# Patient Record
Sex: Male | Born: 1945
Health system: Southern US, Community
[De-identification: ages and names within clinical notes are randomized; demographics above are authoritative.]

## PROBLEM LIST (undated history)

## (undated) DIAGNOSIS — G8929 Other chronic pain: Secondary | ICD-10-CM

## (undated) DIAGNOSIS — M479 Spondylosis, unspecified: Secondary | ICD-10-CM

## (undated) DIAGNOSIS — N471 Phimosis: Secondary | ICD-10-CM

## (undated) DIAGNOSIS — I2510 Atherosclerotic heart disease of native coronary artery without angina pectoris: Secondary | ICD-10-CM

## (undated) DIAGNOSIS — G4733 Obstructive sleep apnea (adult) (pediatric): Secondary | ICD-10-CM

## (undated) DIAGNOSIS — K7460 Unspecified cirrhosis of liver: Secondary | ICD-10-CM

## (undated) DIAGNOSIS — M545 Low back pain, unspecified: Secondary | ICD-10-CM

## (undated) DIAGNOSIS — K219 Gastro-esophageal reflux disease without esophagitis: Secondary | ICD-10-CM

## (undated) DIAGNOSIS — D179 Benign lipomatous neoplasm, unspecified: Secondary | ICD-10-CM

## (undated) DIAGNOSIS — E119 Type 2 diabetes mellitus without complications: Secondary | ICD-10-CM

## (undated) DIAGNOSIS — Z87820 Personal history of traumatic brain injury: Secondary | ICD-10-CM

## (undated) DIAGNOSIS — Z8489 Family history of other specified conditions: Secondary | ICD-10-CM

## (undated) DIAGNOSIS — J45909 Unspecified asthma, uncomplicated: Secondary | ICD-10-CM

## (undated) DIAGNOSIS — E785 Hyperlipidemia, unspecified: Secondary | ICD-10-CM

## (undated) DIAGNOSIS — D509 Iron deficiency anemia, unspecified: Secondary | ICD-10-CM

## (undated) DIAGNOSIS — M1990 Unspecified osteoarthritis, unspecified site: Secondary | ICD-10-CM

## (undated) DIAGNOSIS — I10 Essential (primary) hypertension: Secondary | ICD-10-CM

## (undated) DIAGNOSIS — Z8719 Personal history of other diseases of the digestive system: Secondary | ICD-10-CM

## (undated) DIAGNOSIS — N200 Calculus of kidney: Secondary | ICD-10-CM

## (undated) DIAGNOSIS — I5030 Unspecified diastolic (congestive) heart failure: Secondary | ICD-10-CM

## (undated) DIAGNOSIS — Z9289 Personal history of other medical treatment: Secondary | ICD-10-CM

## (undated) DIAGNOSIS — I509 Heart failure, unspecified: Secondary | ICD-10-CM

## (undated) DIAGNOSIS — Z8739 Personal history of other diseases of the musculoskeletal system and connective tissue: Secondary | ICD-10-CM

## (undated) HISTORY — DX: Obstructive sleep apnea (adult) (pediatric): G47.33

## (undated) HISTORY — PX: NEPHROLITHOTOMY: SUR881

## (undated) HISTORY — PX: HERNIA REPAIR: SHX51

## (undated) HISTORY — DX: Unspecified osteoarthritis, unspecified site: M19.90

## (undated) HISTORY — PX: CORONARY ANGIOPLASTY WITH STENT PLACEMENT: SHX49

## (undated) HISTORY — DX: Essential (primary) hypertension: I10

## (undated) SURGERY — EGD (ESOPHAGOGASTRODUODENOSCOPY)
Anesthesia: Moderate Sedation

---

## 1987-08-30 HISTORY — PX: ANKLE FRACTURE SURGERY: SHX122

## 1989-04-29 HISTORY — PX: NEPHROLITHOTOMY: SUR881

## 1998-02-19 ENCOUNTER — Emergency Department (HOSPITAL_COMMUNITY): Admission: EM | Admit: 1998-02-19 | Discharge: 1998-02-19 | Payer: Self-pay | Admitting: Emergency Medicine

## 1999-10-11 ENCOUNTER — Emergency Department (HOSPITAL_COMMUNITY): Admission: EM | Admit: 1999-10-11 | Discharge: 1999-10-11 | Payer: Self-pay | Admitting: Emergency Medicine

## 2000-09-04 ENCOUNTER — Emergency Department (HOSPITAL_COMMUNITY): Admission: EM | Admit: 2000-09-04 | Discharge: 2000-09-04 | Payer: Self-pay | Admitting: Emergency Medicine

## 2000-09-05 ENCOUNTER — Emergency Department (HOSPITAL_COMMUNITY): Admission: EM | Admit: 2000-09-05 | Discharge: 2000-09-05 | Payer: Self-pay | Admitting: Emergency Medicine

## 2000-09-25 ENCOUNTER — Encounter: Payer: Self-pay | Admitting: Pulmonary Disease

## 2000-09-25 ENCOUNTER — Ambulatory Visit (HOSPITAL_COMMUNITY): Admission: RE | Admit: 2000-09-25 | Discharge: 2000-09-25 | Payer: Self-pay | Admitting: Pulmonary Disease

## 2000-12-29 ENCOUNTER — Encounter (INDEPENDENT_AMBULATORY_CARE_PROVIDER_SITE_OTHER): Payer: Self-pay | Admitting: Specialist

## 2000-12-29 ENCOUNTER — Ambulatory Visit: Admission: RE | Admit: 2000-12-29 | Discharge: 2000-12-29 | Payer: Self-pay | Admitting: Pulmonary Disease

## 2001-04-18 ENCOUNTER — Emergency Department (HOSPITAL_COMMUNITY): Admission: EM | Admit: 2001-04-18 | Discharge: 2001-04-18 | Payer: Self-pay | Admitting: Internal Medicine

## 2001-05-23 ENCOUNTER — Ambulatory Visit (HOSPITAL_COMMUNITY): Admission: RE | Admit: 2001-05-23 | Discharge: 2001-05-23 | Payer: Self-pay | Admitting: Pulmonary Disease

## 2001-05-23 ENCOUNTER — Encounter: Payer: Self-pay | Admitting: Pulmonary Disease

## 2001-06-07 ENCOUNTER — Encounter: Payer: Self-pay | Admitting: Emergency Medicine

## 2001-06-07 ENCOUNTER — Emergency Department (HOSPITAL_COMMUNITY): Admission: EM | Admit: 2001-06-07 | Discharge: 2001-06-07 | Payer: Self-pay | Admitting: Emergency Medicine

## 2003-05-05 ENCOUNTER — Emergency Department (HOSPITAL_COMMUNITY): Admission: EM | Admit: 2003-05-05 | Discharge: 2003-05-05 | Payer: Self-pay | Admitting: Emergency Medicine

## 2003-05-25 ENCOUNTER — Encounter: Payer: Self-pay | Admitting: Emergency Medicine

## 2003-05-25 ENCOUNTER — Emergency Department (HOSPITAL_COMMUNITY): Admission: EM | Admit: 2003-05-25 | Discharge: 2003-05-25 | Payer: Self-pay | Admitting: *Deleted

## 2003-05-26 ENCOUNTER — Emergency Department (HOSPITAL_COMMUNITY): Admission: EM | Admit: 2003-05-26 | Discharge: 2003-05-26 | Payer: Self-pay | Admitting: Emergency Medicine

## 2003-10-08 ENCOUNTER — Emergency Department (HOSPITAL_COMMUNITY): Admission: EM | Admit: 2003-10-08 | Discharge: 2003-10-08 | Payer: Self-pay

## 2004-05-16 ENCOUNTER — Encounter (INDEPENDENT_AMBULATORY_CARE_PROVIDER_SITE_OTHER): Payer: Self-pay | Admitting: *Deleted

## 2004-05-16 ENCOUNTER — Inpatient Hospital Stay (HOSPITAL_COMMUNITY): Admission: EM | Admit: 2004-05-16 | Discharge: 2004-05-22 | Payer: Self-pay | Admitting: Emergency Medicine

## 2004-05-16 HISTORY — PX: APPENDECTOMY: SHX54

## 2004-05-16 HISTORY — PX: COLECTOMY: SHX59

## 2004-05-16 HISTORY — PX: OTHER SURGICAL HISTORY: SHX169

## 2005-08-09 ENCOUNTER — Emergency Department (HOSPITAL_COMMUNITY): Admission: EM | Admit: 2005-08-09 | Discharge: 2005-08-09 | Payer: Self-pay | Admitting: Emergency Medicine

## 2006-04-06 ENCOUNTER — Emergency Department (HOSPITAL_COMMUNITY): Admission: EM | Admit: 2006-04-06 | Discharge: 2006-04-06 | Payer: Self-pay | Admitting: Family Medicine

## 2008-01-12 ENCOUNTER — Emergency Department (HOSPITAL_COMMUNITY): Admission: EM | Admit: 2008-01-12 | Discharge: 2008-01-12 | Payer: Self-pay | Admitting: Emergency Medicine

## 2009-07-04 ENCOUNTER — Emergency Department (HOSPITAL_COMMUNITY): Admission: EM | Admit: 2009-07-04 | Discharge: 2009-07-04 | Payer: Self-pay | Admitting: Emergency Medicine

## 2009-07-04 LAB — CONVERTED CEMR LAB
HCT: 41 %
Hemoglobin: 14.2 g/dL

## 2009-07-06 ENCOUNTER — Emergency Department (HOSPITAL_COMMUNITY): Admission: EM | Admit: 2009-07-06 | Discharge: 2009-07-06 | Payer: Self-pay | Admitting: Emergency Medicine

## 2010-04-02 ENCOUNTER — Emergency Department (HOSPITAL_COMMUNITY): Admission: EM | Admit: 2010-04-02 | Discharge: 2010-04-02 | Payer: Self-pay | Admitting: Emergency Medicine

## 2010-07-24 ENCOUNTER — Emergency Department (HOSPITAL_COMMUNITY): Admission: EM | Admit: 2010-07-24 | Discharge: 2010-07-24 | Payer: Self-pay | Admitting: Emergency Medicine

## 2010-07-24 LAB — CONVERTED CEMR LAB
BUN: 9 mg/dL
Calcium: 8.7 mg/dL
Creatinine, Ser: 0.73 mg/dL
HCT: 41.5 %
Hemoglobin: 14.1 g/dL
MCV: 88.5 fL
Monocytes Relative: 7 %
Potassium: 4 meq/L
RBC: 4.69 M/uL
Sodium: 137 meq/L
WBC: 6.1 10*3/uL

## 2010-08-04 ENCOUNTER — Ambulatory Visit: Payer: Self-pay | Admitting: Internal Medicine

## 2010-08-04 DIAGNOSIS — E118 Type 2 diabetes mellitus with unspecified complications: Secondary | ICD-10-CM | POA: Insufficient documentation

## 2010-08-04 DIAGNOSIS — M109 Gout, unspecified: Secondary | ICD-10-CM | POA: Insufficient documentation

## 2010-08-04 DIAGNOSIS — I119 Hypertensive heart disease without heart failure: Secondary | ICD-10-CM | POA: Insufficient documentation

## 2010-08-04 DIAGNOSIS — Z87442 Personal history of urinary calculi: Secondary | ICD-10-CM | POA: Insufficient documentation

## 2010-08-04 DIAGNOSIS — K429 Umbilical hernia without obstruction or gangrene: Secondary | ICD-10-CM | POA: Insufficient documentation

## 2010-08-04 DIAGNOSIS — Z87891 Personal history of nicotine dependence: Secondary | ICD-10-CM | POA: Insufficient documentation

## 2010-08-04 DIAGNOSIS — K219 Gastro-esophageal reflux disease without esophagitis: Secondary | ICD-10-CM | POA: Insufficient documentation

## 2010-08-04 HISTORY — DX: Gastro-esophageal reflux disease without esophagitis: K21.9

## 2010-08-05 LAB — CONVERTED CEMR LAB
Hgb A1c MFr Bld: 6.8 % — ABNORMAL HIGH (ref 4.6–6.5)
LDL Cholesterol: 124 mg/dL — ABNORMAL HIGH (ref 0–99)
VLDL: 27.4 mg/dL (ref 0.0–40.0)

## 2010-08-12 ENCOUNTER — Encounter: Payer: Self-pay | Admitting: Internal Medicine

## 2010-09-15 ENCOUNTER — Ambulatory Visit
Admission: RE | Admit: 2010-09-15 | Discharge: 2010-09-15 | Payer: Self-pay | Source: Home / Self Care | Attending: Internal Medicine | Admitting: Internal Medicine

## 2010-09-15 DIAGNOSIS — G4730 Sleep apnea, unspecified: Secondary | ICD-10-CM | POA: Insufficient documentation

## 2010-09-19 ENCOUNTER — Encounter: Payer: Self-pay | Admitting: Cardiology

## 2010-09-27 ENCOUNTER — Encounter: Payer: Self-pay | Admitting: Internal Medicine

## 2010-09-30 NOTE — Assessment & Plan Note (Signed)
Summary: 6 WK FU   STC   Vital Signs:  Patient profile:   65 year old male Height:      68 inches (172.72 cm) Weight:      264.8 pounds (120.36 kg) O2 Sat:      95 % on Room air Temp:     98.9 degrees F (37.17 degrees C) oral Pulse rate:   78 / minute BP sitting:   130 / 72  (left arm) Cuff size:   large  Vitals Entered By: Orlan Leavens RMA (September 15, 2010 2:18 PM)  O2 Flow:  Room air CC: 6 WEEK FOLLOW-UP Is Patient Diabetic? Yes Did you bring your meter with you today? No Pain Assessment Patient in pain? no        Primary Care Provider:  Newt Lukes MD  CC:  6 WEEK FOLLOW-UP.  History of Present Illness: here for f/u  1) DM2 - dx 2005; lost to f/u with pcp since that time due to loss of job and insurance coverage until 07/2010 - started metformin 07/2010 - reports compliance and deneis adv se/complications- does not check cbg regularly at home but runs 120-200 when he does check - c/o numbness both feet and occ blurring vision -   2) HTN - started arb 07/2010 for same - reports compliance with ongoing medical treatment and no changes in medication dose or frequency.denies adverse side effects related to current therapy. denies CP or HA but chronic edema BLE -  3) EtOH abuse - daily beer - cutting back on use in past few months - now 1-2 beer/day - denies WD symptoms or prior DTs - no known liver complications or hx legal/employer issues with alcohol  4) gout - recent flare in left ankle - no swelling or recent trauma/injury - +hx prioor falres - improved with self-tx cherry juice  4) GERD - no meds for same regularly - episode indiugestion 3-4x/week - relived with tums as needed  - no abd pain or change in stool, no melena or brbpr hx  Preventive Screening-Counseling & Management  Alcohol-Tobacco     Alcohol drinks/day: 1     Alcohol type: beer     >5/day in last 3 mos: yes     Alcohol Counseling: to STOP drinking     Smoking Status: quit     Year Quit:  1997     Tobacco Counseling: not to resume use of tobacco products  Caffeine-Diet-Exercise     Does Patient Exercise: yes     Exercise Counseling: to improve exercise regimen     Depression Counseling: not indicated; screening negative for depression  Clinical Review Panels:  Lipid Management   Cholesterol:  187 (08/04/2010)   LDL (bad choesterol):  124 (08/04/2010)   HDL (good cholesterol):  35.50 (08/04/2010)  Diabetes Management   HgBA1C:  6.8 (08/04/2010)   Creatinine:  0.73 (07/24/2010)   Last Dilated Eye Exam:  normal Recheck in 12 months (08/12/2010)  CBC   WBC:  6.1 (07/24/2010)   RBC:  4.69 (07/24/2010)   Hgb:  14.1 (07/24/2010)   Hct:  41.5 (07/24/2010)   Platelets:  131 (07/24/2010)   MCV  88.5 (07/24/2010)   RDW  13.8 (07/24/2010)   PMN:  66 (07/24/2010)   Monos:  7 (07/24/2010)   Eosinophils:  2 (07/24/2010)   Basophil:  0 (07/24/2010)  Complete Metabolic Panel   Glucose:  101 (07/24/2010)   Sodium:  137 (07/24/2010)   Potassium:  4.0 (07/24/2010)  Chloride:  103 (07/24/2010)   CO2:  27 (07/24/2010)   BUN:  9 (07/24/2010)   Creatinine:  0.73 (07/24/2010)   Albumin:  3.9 (07/24/2010)   Total Protein:  8.2 (07/24/2010)   Calcium:  8.7 (07/24/2010)   Total Bili:  0.7 (07/24/2010)   Alk Phos:  96 (07/24/2010)   SGPT (ALT):  19 (07/24/2010)   SGOT (AST):  30 (07/24/2010)   Current Medications (verified): 1)  Cozaar 50 Mg Tabs (Losartan Potassium) .Marland Kitchen.. 1 By Mouth Once Daily 2)  Metformin Hcl 500 Mg Xr24h-Tab (Metformin Hcl) .Marland Kitchen.. 1 By Mouth Once Daily 3)  Advil 200 Mg Tabs (Ibuprofen) .Marland Kitchen.. 1-2 By Mouth Every 6 Hours As Needed For Pain Symptoms  Allergies (verified): No Known Drug Allergies  Past History:  Past Medical History: Diabetes mellitus, type II Hypertension GERD  gout umbilical hernia OSA? - no sleep study ever done  MD roster: surg- [facility]  Review of Systems  The patient denies chest pain, syncope, and headaches.         wife  c/o pt "not breathing" when he sleeps, +snoring - told he had sleep apnea at [facility] years ago after surg but never saw sleep doc.  Physical Exam  General:  overweight-appearing.  alert, well-developed, well-nourished, and cooperative to examination.   wife at side Lungs:  normal respiratory effort, no intercostal retractions or use of accessory muscles; normal breath sounds bilaterally - no crackles and no wheezes.    Heart:  normal rate, regular rhythm, no murmur, and no rub. BLE with trace edema.  Abdomen:  5cm round umbilical hernia, not incarcerated - obese, soft, non-tender, normal bowel sounds, no distention; no appreciable hepatomegaly or splenomegaly.     Impression & Recommendations:  Problem # 1:  DIABETES MELLITUS, TYPE II (ICD-250.00)  His updated medication list for this problem includes:    Cozaar 50 Mg Tabs (Losartan potassium) .Marland Kitchen... 1 by mouth once daily    Metformin Hcl 500 Mg Xr24h-tab (Metformin hcl) .Marland Kitchen... 1 by mouth once daily  07/2010 started metformin + ARB cont same  Labs Reviewed: Creat: 0.73 (07/24/2010)     Last Eye Exam: normal Recheck in 12 months (08/12/2010) Reviewed HgBA1c results: 6.8 (08/04/2010)  Problem # 2:  HYPERTENSION (ICD-401.9)  His updated medication list for this problem includes:    Cozaar 50 Mg Tabs (Losartan potassium) .Marland Kitchen... 1 by mouth once daily  started arb 07/2010- improved cont same  BP today: 130/72 Prior BP: 144/72 (08/04/2010)  Labs Reviewed: K+: 4.0 (07/24/2010) Creat: : 0.73 (07/24/2010)   Chol: 187 (08/04/2010)   HDL: 35.50 (08/04/2010)   LDL: 124 (08/04/2010)   TG: 137.0 (08/04/2010)  Problem # 3:  SLEEP APNEA (ICD-780.57)  suspect sleep apnea based on hx and body habitus - ?told same post op [facility] ago at [facility] refer to sleep specialist now -- would be important to dx/tx prior to any elctive surg repair of hernia...  Orders: Sleep Disorder Referral (Sleep Disorder)  Problem # 4:  HERNIA, UMBILICAL (ICD-553.1)  to  folllowup with surg ([facility]) for elective repair once medically "tuned" - pt aware his surg told him to lose weight prior to surg repair  Complete Medication List: 1)  Cozaar 50 Mg Tabs (Losartan potassium) .Marland Kitchen.. 1 by mouth once daily 2)  Metformin Hcl 500 Mg Xr24h-tab (Metformin hcl) .Marland Kitchen.. 1 by mouth once daily 3)  Advil 200 Mg Tabs (Ibuprofen) .Marland Kitchen.. 1-2 by mouth every 6 hours as needed for pain symptoms 4)  Onetouch Test Strp (  Glucose blood) .... Use two times a day  dx 250.00 5)  Onetouch Lancets Misc (Lancets) .... Use two times a day dx 250.00  Patient Instructions: 1)  it was good to see you today. 2)  continue cozaar for blood pressure and metformin for diabetes, same dose - your prescription refills have been electronically submitted to your pharmacy. Please take as directed. Contact our office if you believe you're having problems with the medication(s).  3)  we'll make referral to sleep doctor for possible sleep apnea.  Our office will contact you regarding these appointments once made.  4)  Please schedule a follow-up appointment in 3 months to check blood pressure and medications, call sooner if problems.  5)  Check your blood sugars regularly ever AM. If your readings are usually above 150 or below 70, you should contact our office. Prescriptions: ONETOUCH LANCETS  MISC (LANCETS) USE two times a day DX 250.00  #60 x 5   Entered by:   Orlan Leavens RMA   Authorized by:   Newt Lukes MD   Signed by:   Newt Lukes MD on 09/15/2010   Method used:   Electronically to        [facility] [phone]* (retail)       [address]       [place], [place]  [address]       Ph: [phone]       Fax: [phone]   RxID:   [id number] ONETOUCH TEST  STRP (GLUCOSE BLOOD) USE two times a day  DX 250.00  #60 x 5   Entered by:   Orlan Leavens RMA   Authorized by:   Newt Lukes MD   Signed by:   Newt Lukes MD on 09/15/2010   Method used:   Electronically to         [facility] [phone]* (retail)       [address]       [place], [place]  [address]       Ph: [phone]       Fax: [phone]   RxID:   [id number] METFORMIN HCL 500 MG XR24H-TAB (METFORMIN HCL) 1 by mouth once daily  #30 x 3   Entered and Authorized by:   Newt Lukes MD   Signed by:   Newt Lukes MD on 09/15/2010   Method used:   Electronically to        [facility] [phone]* (retail)       [address]       [place], [place]  [address]       Ph: [phone]       Fax: [phone]   RxID:   [id number] COZAAR 50 MG TABS (LOSARTAN POTASSIUM) 1 by mouth once daily  #30 x 3   Entered and Authorized by:   Newt Lukes MD   Signed by:   Newt Lukes MD on 09/15/2010   Method used:   Electronically to        [facility] [phone]* (retail)       [address]       [place], [place]  [address]       Ph: [phone]       Fax: [phone]   RxID:   [id number]    Orders Added: 1)  Sleep Disorder Referral [Sleep Disorder] 2)  Est. Patient Level IV [423[address]]

## 2010-09-30 NOTE — Assessment & Plan Note (Signed)
Summary: NEW PT/MEDICARE/DM2/GOUT/#/LB   Vital Signs:  Patient profile:   65 year old male Height:      68 inches (172.72 cm) Weight:      260.8 pounds (118.55 kg) BMI:     39.80 O2 Sat:      94 % on Room air Temp:     98.0 degrees F (36.67 degrees C) oral Pulse rate:   70 / minute BP sitting:   144 / 72  (left arm) Cuff size:   large  Vitals Entered By: Orlan Leavens RMA (August 04, 2010 3:01 PM)  O2 Flow:  Room air CC: New patient Is Patient Diabetic? Yes Did you bring your meter with you today? No Pain Assessment Patient in pain? no      Comments Son states father is diabetic, but not taking any medicine   Primary Care Provider:  Newt Lukes MD  CC:  New patient.  History of Present Illness: pt here to est care, new to me and our practice referred by [facility] Andrey Campanile at [facility] for est of medical pcp - has been eval for repair of umbilical hernia -  to be sched elective once chronic med issues are controlled  reviewed chronic medical issues - 1) DM2 - dx 2005 but never on meds for same - lost to f/u with pcp since that time due to loss of job and insurance coverage - does not check cbg regularly at home but runs 120-200 when he does check - c/o numbness both feet and occ blurring vision -   2) HTN - never on meds for same - denies CP or HA but chronic edema BLE -  3) EtOH abuse - daily beer - cutting back on use in past 1 month - now 1-2 beer/day - denies WD symptoms or prior DTs - no known liver complications or hx legal/employer issues with alcohol  4) gout - recent flare in left ankle - no swelling or recent trauma/injury - +hx prioor falres - improved with self-tx cherry juice  4) GERD - no meds for same regularly - episode indiugestion 3-4x/week - relived with tums as needed  - no abd pain or change in stool, no melena or brbpr hx  Preventive Screening-Counseling & Management  Alcohol-Tobacco     Alcohol drinks/day: 1     Alcohol type: beer     >5/day in  last 3 mos: yes     Alcohol Counseling: to STOP drinking     Smoking Status: quit     Year Quit: 1997     Tobacco Counseling: not to resume use of tobacco products  Caffeine-Diet-Exercise     Does Patient Exercise: yes     Exercise Counseling: to improve exercise regimen     Depression Counseling: not indicated; screening negative for depression  Safety-Violence-Falls     Seat Belt Counseling: not indicated; patient wears seat belts     Violence Counseling: not indicated; no violence risk noted  Clinical Review Panels:  CBC   WBC:  6.1 (07/24/2010)   RBC:  4.69 (07/24/2010)   Hgb:  14.1 (07/24/2010)   Hct:  41.5 (07/24/2010)   Platelets:  131 (07/24/2010)   MCV  88.5 (07/24/2010)   RDW  13.8 (07/24/2010)   PMN:  66 (07/24/2010)   Monos:  7 (07/24/2010)   Eosinophils:  2 (07/24/2010)   Basophil:  0 (07/24/2010)  Complete Metabolic Panel   Glucose:  101 (07/24/2010)   Sodium:  137 (07/24/2010)  Potassium:  4.0 (07/24/2010)   Chloride:  103 (07/24/2010)   CO2:  27 (07/24/2010)   BUN:  9 (07/24/2010)   Creatinine:  0.73 (07/24/2010)   Albumin:  3.9 (07/24/2010)   Total Protein:  8.2 (07/24/2010)   Calcium:  8.7 (07/24/2010)   Total Bili:  0.7 (07/24/2010)   Alk Phos:  96 (07/24/2010)   SGPT (ALT):  19 (07/24/2010)   SGOT (AST):  30 (07/24/2010)   -  Date:  07/24/2010    WBC: 6.1    HGB: 14.1    HCT: 41.5    RBC: 4.69    PLT: 131    MCV: 88.5    RDW: 13.8    Neutrophil: 66    Lymphs: 25    Monos: 7    Eos: 2    Basophil: 0    BG Random: 101    BUN: 9    Creatinine: 0.73    Sodium: 137    Potassium: 4.0    Chloride: 103    CO2 Total: 27    SGOT (AST): 30    SGPT (ALT): 19    T. Bilirubin: 0.7    Alk Phos: 96    Calcium: 8.7    Total Protein: 8.2    Albumin: 3.9  Date:  07/04/2009    WBC: 10.0    HGB: 14.2    HCT: 41.0    RBC: 4.57    PLT: 128    MCV: 89.7    RDW: 13.8  Current Medications (verified): 1)  None  Allergies  (verified): No Known Drug Allergies  Past History:  Past Medical History: Diabetes mellitus, type II Hypertension GERD gout umbilical hernia  Past Surgical History: Appendectomy (05/16/04)  Family History: Family History of CAD Male 1st degree relative <50 Family History Diabetes 1st degree relative  Social History: Former Smoker, quit 14 years ago daily beer consumption, occ binges lives with wife and son 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 I have reviewed all other systems and they were negative.   Physical Exam  General:  overweight-appearing.  alert, well-developed, well-nourished, and cooperative to examination.   son and wife at side Lungs:  normal respiratory effort, no intercostal retractions or use of accessory muscles; normal breath sounds bilaterally - no crackles and no wheezes.    Heart:  normal rate, regular rhythm, no murmur, and no rub. BLE with trace edema.  Abdomen:  5cm round umbilical hernia, not incarcerated - obese, soft, non-tender, normal bowel sounds, no distention; no appreciable hepatomegaly or splenomegaly.   Msk:  left ankle with diffues soft tissue swelling, mild warmth, no deformity; +tophi on toes c/w gouty hx Neurologic:  alert & oriented X3 and cranial nerves II-XII symetrically intact.  strength normal in all extremities, sensation intact to light touch, and gait normal. speech fluent without dysarthria or aphasia; follows commands with good comprehension.  Skin:  BLE fine varicosity changes distally - cutaneous horn growths x 2 on RLE medial right calf and subpatellar region Psych:  Oriented X3, memory intact for recent and remote, normally interactive, good eye contact, not anxious appearing, not depressed appearing, and not agitated.       Impression & Recommendations:  Problem # 1:  DIABETES MELLITUS, TYPE  II (ICD-250.00) check a1c now and start metformin + ARB check cbg each AM refer to optho His updated medication list for this problem includes:    Cozaar 50 Mg Tabs (Losartan potassium) .Marland Kitchen... 1 by mouth once daily    Metformin Hcl 500 Mg Xr24h-tab (Metformin hcl) .Marland Kitchen... 1 by mouth once daily  Orders: TLB-A1C / Hgb A1C (Glycohemoglobin) (83036-A1C) TLB-Lipid Panel (80061-LIPID) Prescription Created Electronically [phone] Ophthalmology Referral (Ophthalmology) Podiatry Referral (Podiatry)  Labs Reviewed: Creat: 0.73 (07/24/2010)     Problem # 2:  HYPERTENSION (ICD-401.9)  start arb -  recheck 6 weeks, titrate as needed  His updated medication list for this problem includes:    Cozaar 50 Mg Tabs (Losartan potassium) .Marland Kitchen... 1 by mouth once daily  Orders: Prescription Created Electronically [phone])  Problem # 3:  GERD (ICD-530.81)  rec dec beer consumption no anemia, conside need for H2B or PPI  Labs Reviewed: Hgb: 14.1 (07/24/2010)   Hct: 41.5 (07/24/2010)  Problem # 4:  GOUT, UNSPECIFIED (ICD-274.9)  hx same - reviewed relationship between dx and alcohol avoid steroids due to DM avoid colchine due to starting metformin now (both cause diarrhea) - use tylenol/advil as needed as ongoing at home  Elevate extremity; warm compresses, symptomatic relief and medication as directed.   Problem # 5:  HERNIA, UMBILICAL (ICD-553.1) to folllowup with urg ([facility]) for elective repair once medically "tuned" -  Complete Medication List: 1)  Cozaar 50 Mg Tabs (Losartan potassium) .Marland Kitchen.. 1 by mouth once daily 2)  Metformin Hcl 500 Mg Xr24h-tab (Metformin hcl) .Marland Kitchen.. 1 by mouth once daily 3)  Advil 200 Mg Tabs (Ibuprofen) .Marland Kitchen.. 1-2 by mouth every 6 hours as needed for pain symptoms  Patient Instructions: 1)  it was good to see you today. 2)  test(s) ordered today - your results will be posted on the phone tree for  review in 48-72 hours from the time of test completion; call [phone] and enter your 9 digit MRN (listed above on this page, just below your name); if any changes need to be made or there are abnormal results, you will be contacted directly. 3)  start cozaar for blood pressure and metformin for diabetes - your prescriptions have been electronically submitted to your pharmacy. Please take as directed. Contact our office if you believe you're having problems with the medication(s).  4)  we'll make referral to eye doctor and foot doctor. Our office will contact you regarding these appointments once made.  5)  Please schedule a follow-up appointment in 6 weeks to check blood pressure and medications, call sooner if problems.  6)  Check your blood sugars regularly ever AM. If your readings are usually above 150 or below 70, you should contact our office. Prescriptions: METFORMIN HCL 500 MG XR24H-TAB (METFORMIN HCL) 1 by mouth once daily  #30 x 3   Entered and Authorized by:   Newt Lukes MD   Signed by:   Newt Lukes MD on 08/04/2010   Method used:   Electronically to        [facility] [phone]* (retail)       [address]       [place], [place]  [address]       Ph: [phone]       Fax: [phone]   RxID:   [id number] COZAAR 50 MG TABS (LOSARTAN POTASSIUM) 1 by mouth once daily  #30 x 3   Entered and Authorized by:   Newt Lukes MD  Signed by:   Newt Lukes MD on 08/04/2010   Method used:   Electronically to        [facility] [phone]* (retail)       [address]       [place], [place]  [address]       Ph: [phone]       Fax: [phone]   RxID:   [id number]    Orders Added: 1)  TLB-A1C / Hgb A1C (Glycohemoglobin) [83036-A1C] 2)  TLB-Lipid Panel [80061-LIPID] 3)  Prescription Created Electronically [G8553] 4)  New Patient Level IV [72536] 5)  Ophthalmology Referral [Ophthalmology] 6)  Podiatry Referral [Podiatry]

## 2010-09-30 NOTE — Letter (Signed)
Summary: [facility]   Imported By: Sherian Rein 08/25/2010 07:57:05  _____________________________________________________________________  External Attachment:    Type:   Image     Comment:   External Document

## 2010-10-04 ENCOUNTER — Encounter: Payer: Self-pay | Admitting: Internal Medicine

## 2010-10-06 ENCOUNTER — Institutional Professional Consult (permissible substitution): Payer: Self-pay | Admitting: Pulmonary Disease

## 2010-10-07 ENCOUNTER — Telehealth: Payer: Self-pay | Admitting: Pulmonary Disease

## 2010-10-14 NOTE — Progress Notes (Signed)
Summary: nos appt  Phone Note Call from Patient   Caller: [email or web address]  Call For: Pegah Segel Summary of Call: LMTCB x2 to [facility] nos from 2/8. Initial call taken by: Darletta Moll,  October 07, 2010 4:40 PM

## 2010-10-14 NOTE — Letter (Signed)
Summary: [facility]  [facility]lies/[facility]   Imported By: Sherian Rein 10/05/2010 09:08:09  _____________________________________________________________________  External Attachment:    Type:   Image     Comment:   External Document

## 2010-10-20 NOTE — Letter (Signed)
Summary: Larey Dresser MD/[facility]  Larey Dresser MD/[facility]   Imported By: Lester Dublin 10/15/2010 09:17:14  _____________________________________________________________________  External Attachment:    Type:   Image     Comment:   External Document

## 2010-10-29 ENCOUNTER — Encounter: Payer: Self-pay | Admitting: Pulmonary Disease

## 2010-10-29 ENCOUNTER — Institutional Professional Consult (permissible substitution) (INDEPENDENT_AMBULATORY_CARE_PROVIDER_SITE_OTHER): Payer: Medicare Other | Admitting: Pulmonary Disease

## 2010-10-29 DIAGNOSIS — G4733 Obstructive sleep apnea (adult) (pediatric): Secondary | ICD-10-CM | POA: Insufficient documentation

## 2010-11-09 LAB — BASIC METABOLIC PANEL
CO2: 27 mEq/L (ref 19–32)
Calcium: 8.7 mg/dL (ref 8.4–10.5)
Creatinine, Ser: 0.73 mg/dL (ref 0.4–1.5)
GFR calc Af Amer: >60 mL/min (ref >60)
GFR calc non Af Amer: >60 mL/min (ref >60)
Glucose, Bld: 101 mg/dL — ABNORMAL HIGH (ref 70–99)

## 2010-11-09 LAB — CBC
Hemoglobin: 14.1 g/dL (ref 13.0–17.0)
MCH: 30.1 pg (ref 26.0–34.0)
Platelets: 131 10*3/uL — ABNORMAL LOW (ref 150–400)
RBC: 4.69 MIL/uL (ref 4.22–5.81)

## 2010-11-09 LAB — HEPATIC FUNCTION PANEL
ALT: 19 U/L (ref 0–53)
AST: 30 U/L (ref 0–37)
Alkaline Phosphatase: 96 U/L (ref 39–117)
Indirect Bilirubin: 0.6 mg/dL (ref 0.3–0.9)
Total Bilirubin: 0.7 mg/dL (ref 0.3–1.2)
Total Protein: 8.2 g/dL (ref 6.0–8.3)

## 2010-11-09 LAB — LIPASE, BLOOD: Lipase: 25 U/L (ref 11–59)

## 2010-11-09 LAB — DIFFERENTIAL
Eosinophils Relative: 2 % (ref 0–5)
Lymphocytes Relative: 25 % (ref 12–46)
Monocytes Absolute: 0.4 10*3/uL (ref 0.1–1.0)

## 2010-11-09 NOTE — Assessment & Plan Note (Signed)
Summary: consult for possible osa   Copy to:  Rene Paci Primary Provider/Referring Provider:  Newt Lukes MD  CC:  Sleep Consult.  Pt c/o trouble staying asleep and snoring while sleeping. Marland Kitchen  History of Present Illness: the pt is a 65y/o male who I have been asked to see for possible osa.  He has been noted to have loud snoring, as well as an abnormal breathing pattern during sleep.  He goes to bed at 10pm and arises at 6am.  He notes restless sleep, but does feel reasonably well in the am upon arising.  However, he quickly begins to have sleepiness, and states that he can fall asleep during the day with reading or watching tv.  The pt does not drive.  He tells me that his weight is up 30 pounds over the last 2 yrs, and his epworth score today is normal at 7.    Medications Prior to Update: 1)  Cozaar 50 Mg Tabs (Losartan Potassium) .Marland Kitchen.. 1 By Mouth Once Daily 2)  Metformin Hcl 500 Mg Xr24h-Tab (Metformin Hcl) .Marland Kitchen.. 1 By Mouth Once Daily 3)  Advil 200 Mg Tabs (Ibuprofen) .Marland Kitchen.. 1-2 By Mouth Every 6 Hours As Needed For Pain Symptoms 4)  Onetouch Test  Strp (Glucose Blood) .... Use Two Times A Day  Dx 250.00 5)  Onetouch Lancets  Misc (Lancets) .... Use Two Times A Day Dx 250.00  Allergies (verified): No Known Drug Allergies  Past History:  Past Medical History: Reviewed history from 09/15/2010 and no changes required. Diabetes mellitus, type II Hypertension GERD  gout umbilical hernia OSA? - no sleep study ever done  MD roster: surg- [facility]  Past Surgical History: Appendectomy (05/16/04) kidney stones colon surgery Rt leg surgery  Family History: Reviewed history from 08/04/2010 and no changes required. Family History of CAD Male 1st degree relative <50 Family History Diabetes 1st degree relative  emphysema: sister (copd) allergies:son asthma:son, father heart disease: brothers clotting disorders brother rheumatism: daughter cancer: 2 sisters (lung, colon),  2 brothers (lung, unsure)   Social History: Reviewed history from 08/04/2010 and no changes required. Former Smoker, 2000.  started at age 3.  1 1/2 ppd. daily beer consumption, occ binges lives with wife and son retired.  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  The patient denies shortness of breath at rest, non-productive cough, coughing up blood, irregular heartbeats, acid heartburn, indigestion, loss of appetite, weight change, difficulty swallowing, sore throat, tooth/dental problems, headaches, nasal congestion/difficulty breathing through nose, sneezing, itching, ear ache, anxiety, depression, joint stiffness or pain, rash, change in color of mucus, and fever.    Vital Signs:  Patient profile:   65 year old male Height:      68 inches Weight:      263 pounds BMI:     40.13 O2 Sat:      95 % on Room air Temp:     98.4 degrees F oral Pulse rate:   82 / minute BP sitting:   164 / 80  (right arm) Cuff size:   large  Vitals Entered By: Arman Filter LPN (October 28, 1608 2:24 PM)  O2 Flow:  Room air CC: Sleep Consult.  Pt c/o trouble staying asleep and snoring while sleeping.  Comments Medications reviewed with  patient Arman Filter LPN  October 28, 1608 2:24 PM    Physical Exam  General:  obese male in nad  Eyes:  PERRLA and EOMI.   Nose:  deviated septum to left with narrowing no purulence or discharge Mouth:  crowded posteriorly, +elongation of soft palate and uvula Neck:  no jvd, tmg, LN  Lungs:  clear to auscultation  Heart:  rrr, no mrg  Abdomen:  soft and nontender, bs+ Extremities:  2+ edema with venous stasis changes, no cyanosis  pulses intact but decreased. Neurologic:  awake, but does appear sleepy moves all 4    Impression &  Recommendations:  Problem # 1:  OBSTRUCTIVE SLEEP APNEA (ICD-327.23) the pt's history is very suggestive of osa.  He is obese, has been noted to have loud snoring and abnormal breathing pattern during sleep, and has significant daytime sleepiness with inactivity.  I have had a long discussion with the pt about sleep apnea, including its impact on QOL and CV health.  He needs to have a sleep study for diagnosis, and we will arrange followup once results are available.    Other Orders: Consultation Level IV (96045) Sleep Disorder Referral (Sleep Disorder)  Patient Instructions: 1)  will schedule for sleep study, and arrange followup once results are available. 2)  work on weight loss.

## 2010-12-01 ENCOUNTER — Ambulatory Visit (HOSPITAL_BASED_OUTPATIENT_CLINIC_OR_DEPARTMENT_OTHER): Payer: Medicare Other

## 2010-12-01 LAB — DIFFERENTIAL
Basophils Absolute: 0.1 10*3/uL (ref 0.0–0.1)
Basophils Relative: 1 % (ref 0–1)
Eosinophils Absolute: 0.1 10*3/uL (ref 0.0–0.7)
Eosinophils Relative: 1 % (ref 0–5)
Lymphocytes Relative: 13 % (ref 12–46)
Monocytes Absolute: 0.7 10*3/uL (ref 0.1–1.0)
Neutro Abs: 7.9 10*3/uL — ABNORMAL HIGH (ref 1.7–7.7)
Neutrophils Relative %: 79 % — ABNORMAL HIGH (ref 43–77)

## 2010-12-01 LAB — CBC
Platelets: 128 10*3/uL — ABNORMAL LOW (ref 150–400)
RBC: 4.57 MIL/uL (ref 4.22–5.81)
RDW: 13.8 % (ref 11.5–15.5)
WBC: 10 10*3/uL (ref 4.0–10.5)

## 2010-12-01 LAB — GLUCOSE, CAPILLARY: Glucose-Capillary: 139 mg/dL — ABNORMAL HIGH (ref 70–99)

## 2010-12-02 ENCOUNTER — Ambulatory Visit (HOSPITAL_BASED_OUTPATIENT_CLINIC_OR_DEPARTMENT_OTHER): Payer: Medicare Other | Attending: Pulmonary Disease

## 2010-12-02 DIAGNOSIS — I491 Atrial premature depolarization: Secondary | ICD-10-CM | POA: Insufficient documentation

## 2010-12-02 DIAGNOSIS — G4733 Obstructive sleep apnea (adult) (pediatric): Secondary | ICD-10-CM | POA: Insufficient documentation

## 2010-12-02 DIAGNOSIS — R0609 Other forms of dyspnea: Secondary | ICD-10-CM | POA: Insufficient documentation

## 2010-12-02 DIAGNOSIS — R0989 Other specified symptoms and signs involving the circulatory and respiratory systems: Secondary | ICD-10-CM | POA: Insufficient documentation

## 2010-12-06 ENCOUNTER — Ambulatory Visit: Payer: Medicare Other | Admitting: Internal Medicine

## 2010-12-07 ENCOUNTER — Other Ambulatory Visit: Payer: Self-pay | Admitting: Internal Medicine

## 2010-12-14 DIAGNOSIS — R0609 Other forms of dyspnea: Secondary | ICD-10-CM

## 2010-12-14 DIAGNOSIS — G4733 Obstructive sleep apnea (adult) (pediatric): Secondary | ICD-10-CM

## 2010-12-14 DIAGNOSIS — I491 Atrial premature depolarization: Secondary | ICD-10-CM

## 2010-12-14 DIAGNOSIS — R0989 Other specified symptoms and signs involving the circulatory and respiratory systems: Secondary | ICD-10-CM

## 2010-12-15 ENCOUNTER — Telehealth: Payer: Self-pay | Admitting: Pulmonary Disease

## 2010-12-15 NOTE — Procedures (Signed)
[name], [name]                ACCOUNT NO.:  [id number]  MEDICAL RECORD NO.:  [id number]          PATIENT TYPE:  OUT  LOCATION:  SLEEP CENTER                 FACILITY:  [facility]  PHYSICIAN:  Barbaraann Share, MD,FCCPDATE OF BIRTH:  December 24[birth year] 1945  DATE OF STUDY:  12/02/2010                           NOCTURNAL POLYSOMNOGRAM  REFERRING PHYSICIAN:  Bradan Congrove M Izic Stfort  INDICATION FOR STUDY:  Hypersomnia with sleep apnea.  EPWORTH SLEEPINESS SCORE:  5.  MEDICATIONS:  SLEEP ARCHITECTURE:  The patient had a total sleep time of 142 minutes with no slow wave sleep and only 19 minutes of REM.  Sleep onset latency was prolonged at 41 minutes and REM onset was prolonged at 251 minutes. Sleep efficiency was poor at 35%.  RESPIRATORY DATA:  The patient was found to have one central apnea and 48 obstructive hypopneas, giving him an apnea-hypopnea index of 21 events per hour.  The events occurred in all body positions and there was very loud snoring noted throughout.  OXYGEN DATA:  There was O2 desaturation as low as 82% with the patient's obstructive events.  CARDIAC DATA:  The patient was found to have an occasional PAC as well as fusion beats noted.  There were no clinically significant arrhythmia seen.  MOVEMENT-PARASOMNIA:  The patient had no significant leg jerks or other abnormal behaviors.  IMPRESSIONS-RECOMMENDATIONS: 1. Moderate obstructive sleep apnea with an AHI of 21 events per hour     and O2 desaturation as low as 82%.  Treatment for this degree of     sleep apnea can include a trial of weight loss alone, upper airway     surgery, dental     appliance, and also CPAP.  Clinical correlation is suggested. 2. Occasional PAC and fusion beat noted, but no clinically significant     arrhythmias were seen.     Barbaraann Share, MD,FCCP Diplomate, American Board of Sleep Medicine Electronically Signed    [facility]/MEDQ  D:  12/14/2010 20:25:30  T:  12/15/2010 16:10:96  Job:  [id number]

## 2010-12-15 NOTE — Telephone Encounter (Signed)
[id number] 

## 2010-12-20 ENCOUNTER — Ambulatory Visit (INDEPENDENT_AMBULATORY_CARE_PROVIDER_SITE_OTHER): Payer: Medicare Other | Admitting: Pulmonary Disease

## 2010-12-20 ENCOUNTER — Encounter: Payer: Self-pay | Admitting: Pulmonary Disease

## 2010-12-20 DIAGNOSIS — G4730 Sleep apnea, unspecified: Secondary | ICD-10-CM

## 2010-12-20 DIAGNOSIS — G4733 Obstructive sleep apnea (adult) (pediatric): Secondary | ICD-10-CM

## 2010-12-20 NOTE — Progress Notes (Signed)
  Subjective:    Patient ID: DARRYN KYDD, male    DOB: 1945-09-02, 65 y.o.   MRN: [id number]  HPI The pt comes in today for review of his recent sleep study.  He was found to have moderate osa, with AHI 21/hr and desat to < 88%.  I have reviewed the study with him in detail, and answered all of his questions.    Review of Systems  Constitutional: Negative for fever and unexpected weight change.  HENT: Positive for rhinorrhea and sinus pressure. Negative for ear pain, congestion, sore throat, sneezing, trouble swallowing, dental problem and postnasal drip.   Eyes: Negative for redness and itching.  Respiratory: Positive for cough, shortness of breath and wheezing. Negative for chest tightness.   Cardiovascular: Positive for leg swelling. Negative for palpitations.  Gastrointestinal: Negative for nausea and vomiting.  Genitourinary: Negative for dysuria.  Musculoskeletal: Negative for joint swelling.  Skin: Negative for rash.  Neurological: Negative for headaches.  Hematological: Does not bruise/bleed easily.  Psychiatric/Behavioral: Negative for dysphoric mood. The patient is not nervous/anxious.        Objective:   Physical Exam Obese male in nad No purulence or discharge noted from nares LE with mild edema, no cyanosis Awake, but appears mildly sleepy, moves all 4         Assessment & Plan:

## 2010-12-20 NOTE — Patient Instructions (Signed)
Will set up on cpap at moderate level, and see you back in 5 weeks for followup.  Please call if having tolerance issues. Work on weight loss

## 2010-12-20 NOTE — Assessment & Plan Note (Signed)
The pt has moderate osa by his recent sleep study, along with significant symptoms and comorbid medical conditions.  I have reviewed the various treatments with him, including weight loss, surgery, dental appliance, and cpap.  I suspect his SDB is worse at home compared to the sleep center.  I think cpap coupled with weight reduction are his best options for treatment.  He is willing to give this a try.

## 2010-12-21 NOTE — Telephone Encounter (Signed)
Will sign off on this message.  Pt was seen yesterday by [facility] to discuss sleep study results.

## 2010-12-27 ENCOUNTER — Encounter: Payer: Self-pay | Admitting: Pulmonary Disease

## 2011-01-10 ENCOUNTER — Encounter: Payer: Self-pay | Admitting: Pulmonary Disease

## 2011-01-14 NOTE — Op Note (Signed)
NAME:  [name], [name]                          ACCOUNT NO.:  [id number]   MEDICAL RECORD NO.:  [id number]                   PATIENT TYPE:  INP   LOCATION:  1827                                 FACILITY:  [facility]   PHYSICIAN:  Abigail Miyamoto, M.D.              DATE OF BIRTH:  March 20, 1946   DATE OF PROCEDURE:  05/16/2004  DATE OF DISCHARGE:                                 OPERATIVE REPORT   PREOPERATIVE DIAGNOSIS:  Appendictis.   POSTOPERATIVE DIAGNOSIS:  Appendicitis.   OPERATION PERFORMED:  Laparoscopic converted to open appendectomy with  partial cecectomy.   SURGEON:  Douglas A. Magnus Ivan, M.D.   ANESTHESIA:  General endotracheal.   ESTIMATED BLOOD LOSS:  Minimal.   INDICATIONS FOR PROCEDURE:  [name] is a 65 year old gentleman who  presented to the emergency department with a week history of abdominal pain.  He was found to have a normal white blood cell count and normal hemoglobin.  A CAT scan of the abdomen and pelvis showed a possible appendicitis with a 5  cm abscess versus the appendix being 5 cm dilated in the midportion.  Given  these findings, decision was made to proceed to the operating room for  appendectomy.   OPERATIVE FINDINGS:  The patient was found to have a retrocecal appendix  which was quite dilated at the base involving the cecum.  There was no  evidence of perforation. A partial cecectomy was necessary in order to  remove the appendix.  It did not appear to be consistent with a carcinoma  although final pathology will be pending.  No other intra-abdominal  pathology was identified.   DESCRIPTION OF PROCEDURE:  The patient was brought to the operating room and  identified as [name].  He was placed supine on the operating table and  general anesthesia was induced.  His abdomen was then prepped and draped in  the usual sterile fashion.  Using a #15 blade, a small transverse incision  was made below the umbilicus.  The incision was carried down  to fascia which  was then opened with a scalpel.  A hemostat was used to pass in to the  peritoneal cavity.  A 0 Vicryl pursestring suture was then placed around the  fascial opening.  The Hasson port was placed through the opening and  insufflation of the abdomen was begun.  A 5 mm port was then placed in the  patient's right upper quadrant and a 12 mm port was placed in the patient's  suprapubic area both under direct vision.  The cecum was then identified.  The patient was found to have the terminal ileum stuck to the abdominal side  wall and multiple attempt were made to free this but this could not be done.  The appendix was also retrocecal in origin, so therefore, given these  findings, decision was made to convert to open procedure.  At this point the  patient was placed back in the supine position and was deflated and all  ports were removed.  A right lower quadrant transverse incision was then  created with a #10 blade.  Incision was carried down through the fascia with  the electrocautery.  The abdominal muscles were then bluntly dissected apart  and the peritoneum was identified and opened the entire length of the  incision.  After entering the abdomen, it became apparent that a larger  incision was needed. Therefore, the rectus muscles were split with the  electrocautery.  The small bowel was finally freed up from the pelvic side  wall and the terminal ileum and cecum were identified.  The cecum was found  to be stuck in the retroperitoneum and finally, hand dissection was used to  dissect retroperitoneally and flip up the rest of the cecum which appeared  to contain an extremely dilated appendix right at the base of the cecum  without evidence of perforation.  It was very difficult to identify the  anatomy around the cecum.  The mesoappendix was found to be quite  obliterated.  After further dissection and mobilization of the right colon,  the terminal ileum could be identified  and was not found to be involved with  the base of the appendix.  This large base which involved the entire cecum  was dissected out further and finally could be transected with the GIA-75  stapler removing the cecum and the appendix.  The appendix was opened and a  small lumen was identified along with a largely dilated torsion and did not  appear to contain carcinoma.  Dr. Ovidio Kin scrubbed in at this point and  evaluated the appendix and agreed with my assessment and we decided to hold  on any further right hemicolectomy.  At this point the abdomen was then  copiously irrigated with normal saline.  The retroperitoneum was again  examined and hemostasis was felt to be achieved.  A separate skin incision  was made and a 44 [place] Blake was placed into the retroperitoneum in this  location.  The peritoneum was then closed with running 2-0 Vicryl suture.  The fascia was then closed with running #1 Prolene suture.  Skin was then  irrigated and all skin sites were closed with skin staples.  The 0 Vicryl at  the umbilicus was closed, closing the fascial defect.  All skin incisions  were closed with skin staples.  The patient tolerated the procedure well.  All counts were correct at the end of the procedure.  The patient was then  taken in stable condition from the operating room to the recovery room.      DB/MEDQ  D:  05/16/2004  T:  05/17/2004  Job:  [id number]

## 2011-01-14 NOTE — Procedures (Signed)
[facility]  Patient:    DELLIS, VOGHT                       MRN: [id number] Proc. Date: 12/29/00 Adm. Date:  [id number] Disc. Date: [id number] Attending:  Sandi Raveling                           Procedure Report  PROCEDURE:  Flexible fiberoptic bronchoscopy.  INDICATIONS FOR PROCEDURE:  Hemoptysis in a long time smoker which is recurrent in nature and of unknown etiology.  SURGEON:  Dr. Shelle Iron.  ANESTHESIA:  Demerol 100 mg IV, Versed 10 mg IV, and topical 1% lidocaine on the vocal cords and airways during the procedure.  DESCRIPTION OF PROCEDURE:  After obtaining informed consent and under close cardiopulmonary monitoring, the above fiberoptic scope was passed through the right naris and into the oropharynx where there was no lesion or other abnormalities seen. The vocal cords appeared to be within normal limits and moved bilaterally on phonation. There was no evidence of upper airway source for his bleeding. The scope was then passed into the trachea where it was examined along its entire length down to the level of the carina all of which was normal. There was blood coating the lower trachea. The right tracheobronchial tree was examined serially with no endobronchial lesion or bleeding seen. The left tracheobronchial tree was normal except in the left upper lobe bronchus and especially in the apical posterior segment of the left upper lobe. There was mucosal changes at its origin with inflammation, erythema, and white inflammatory material covering this area. Bronchioloalveolar lavage was done in this area as well as brushes and pinch biopsies. The patient also had Wang needle aspiration in the event there was something extrinsic trying to break through. Overall the patient tolerated the procedure well and there were no complications. DD:  12/29/00 TD:  12/29/00 Job: [id number]

## 2011-01-14 NOTE — H&P (Signed)
NAME:  [name], [name]                          ACCOUNT NO.:  [id number]   MEDICAL RECORD NO.:  [id number]                   PATIENT TYPE:  EMS   LOCATION:  MINO                                 FACILITY:  [facility]   PHYSICIAN:  Abigail Miyamoto, M.D.              DATE OF BIRTH:  September 08, 1945   DATE OF ADMISSION:  05/16/2004  DATE OF DISCHARGE:                                HISTORY & PHYSICAL   CHIEF COMPLAINT:  Abdominal pain.   HISTORY:  This is a 65 year old morbidly obese gentleman who reports he has  been having had right-sided abdominal pain for approximately one week, in  the right lower and right upper quadrant areas. He has had no nausea and  vomiting. The pain has gotten worse over the past 24 hours. It is more  diffuse in nature. It is difficult to say whether this is crampy or Mathurin.  He has had again no nausea or vomiting. He has had normal bowel movements.   PAST MEDICAL HISTORY:  Significant for kidney stones.   PAST SURGICAL HISTORY:  Removal of kidney stones in the operating room and  orthopedic repair of right ankle fracture.   MEDICATIONS:  None.   ALLERGIES:  No known drug allergies.   SOCIAL HISTORY:  He no longer smokes or drinks. However, he used to  approximately 10 years ago a lot, according to him.   FAMILY HISTORY:  Significant for diabetes and hypertension.   REVIEW OF SYSTEMS:  Negative for chest pain, fever, shortness of breath, or  dysuria. He is otherwise unremarkable.   PHYSICAL EXAMINATION:  GENERAL: An obese white male in mild discomfort.  VITAL SIGNS: Temperature 99, heart rate 78, respiratory rate 20, blood  pressure 130/77.  HEENT: Eyes are anicteric. Pupils are reactive bilaterally. Ears, nose,  mouth, throat, and external ears are normal. Hearing is normal. Oropharynx  is clear.  NECK: Supple. There is no cervical adenopathy. There is no thyromegaly.  LUNGS: Clear to auscultation bilaterally without normal respiratory effort.  CARDIOVASCULAR: Regular rate and rhythm. There are no murmurs. There is no  peripheral edema.  ABDOMEN: Soft and obese. He has a well healed right flank incision. There is  mild tenderness in the right lower quadrant with minimal guarding. There is  a very small umbilical hernia. There are no inguinal hernias.  EXTREMITIES: Warm, well perfused. No edema, clubbing, or cyanosis.  NEUROLOGIC: Nonfocal.   LABORATORY DATA:  White blood cell count of 9.6 with no left shift.  Urinalysis is negative. CAT scan of the abdomen is performed without  contrast. It does however show inflammation of the right upper quadrant and  an inflamed appendix, and a collection that may be an abscess versus 5 cm  dilation of the appendix itself.   IMPRESSION/PLAN:  This is a patient with acute appendicitis and possible  perforation with abscess. At this point we will attempt laparoscopic  appendectomy. I  discussed this with the patient and his wife. We discussed  the risks of surgery including bleeding, infection, and need to convert to  an open procedure. At this point they wish to proceed. Surgery will thus be  scheduled.       DB/MEDQ  D:  05/16/2004  T:  05/16/2004  Job:  [id number]

## 2011-01-14 NOTE — Discharge Summary (Signed)
NAMEHRISHIKESH, HOEG                ACCOUNT NO.:  [id number]   MEDICAL RECORD NO.:  [id number]          PATIENT TYPE:  INP   LOCATION:  5705                         FACILITY:  [facility]   PHYSICIAN:  Abigail Miyamoto, M.D. DATE OF BIRTH:  Dec 02, 1945   DATE OF ADMISSION:  05/16/2004  DATE OF DISCHARGE:  05/22/2004                                 DISCHARGE SUMMARY   DISCHARGE DIAGNOSIS:  Ruptured retrocecal appendix.  He is status post  laparoscopic converted to open appendectomy.   SUMMARY OF HISTORY:  [name] is a 65 year old gentleman who  presented to [facility] with abdominal pain on the right side for  over a week that was worse for the last 24 hours.  He had a CT scan of the  abdomen and the pelvis which showed him to have findings consistent with  appendicitis.  He had a white blood count of 9.6 and mild tenderness in his  right lower quadrant on examination.  Given these findings, the decision was  made to take him to the operating room for appendectomy.   HOSPITAL COURSE:  The patient was admitted and taken to the operating room  where he underwent a laparoscopic converted to open appendectomy with  partial cecectomy where he was found to have a retrocecal appendix which was  dilated greater than 5 cm at the border and at the base.  He, however,  tolerated the procedure well and was taken in stable condition to the  regular surgical floor with a Blake drain in place.  On postoperative day  one, he was doing well except for some mild postoperative fevers.  On  postoperative day two, he was voiding well and passing flatus.  His abdomen  was soft and non-distended.  At this point, he was continued on intravenous  antibiotics and began ambulating on postoperative day three.  He was  continuing to do well and tolerating a diet.  Pathology showed no malignancy  in the pathology specimen.  He was continued on a soft diet with normal  intravenous antibiotics.  For the  next several days, he was kept on the  antibiotics and his drain was still draining just serosanguineous fluid.  He  did have some erythema of his incision, however, developing.  On  postoperative day six, he was doing quite well and was tolerating a regular  diet with oral pain medication.  His wound remained just mildly erythematous  but there was no drainage.  At this point, his Harrison Mons drain was removed and  he was discharged home on oral antibiotics.   DISCHARGE MEDICATIONS:  He will resume his home medications.  He is going to  take Keflex 500 mg p.o. t.i.d.  He will take Percocet and Advil for pain.   DISCHARGE ACTIVITIES:  He is to do no heavy lifting greater than 20 pounds  for six weeks.  Wound care: He may shower.   DISCHARGE FOLLOWUP:  He will follow up in my office in one week post-  discharge.       DB/MEDQ  D:  07/07/2004  T:  07/07/2004  Job:  [id number]

## 2011-01-17 ENCOUNTER — Encounter: Payer: Self-pay | Admitting: Pulmonary Disease

## 2011-01-25 ENCOUNTER — Ambulatory Visit: Payer: Medicare Other | Admitting: Pulmonary Disease

## 2011-01-28 ENCOUNTER — Ambulatory Visit (INDEPENDENT_AMBULATORY_CARE_PROVIDER_SITE_OTHER): Payer: Medicare Other | Admitting: Pulmonary Disease

## 2011-01-28 ENCOUNTER — Encounter: Payer: Self-pay | Admitting: Pulmonary Disease

## 2011-01-28 VITALS — BP 142/84 | HR 70 | Temp 98.3°F | Ht 68.0 in | Wt 260.6 lb

## 2011-01-28 DIAGNOSIS — G4733 Obstructive sleep apnea (adult) (pediatric): Secondary | ICD-10-CM

## 2011-01-28 NOTE — Assessment & Plan Note (Signed)
The pt is doing well with cpap, and feels that he is sleeping better with improved daytime alertness.  At this point, we need to optimize his pressure, and can do this on auto mode for next 2 weeks.  I will call him with results after I receive the download.  I have also encouraged him to work aggressively on weight loss.

## 2011-01-28 NOTE — Progress Notes (Signed)
  Subjective:    Patient ID: [name], male    DOB: December 13, 1945, 65 y.o.   MRN: [id number]  HPI The pt comes in today for f/u of his moderate osa.  At the last visit, he was started on cpap at a moderate level, and feels he has done well with the device.  He denies any pressure or mask fit issues, and has worn compliantly.  He feels he is sleeping better, and that his daytime alertness has improved.    Review of Systems  Constitutional: Negative for fever and unexpected weight change.  HENT: Positive for rhinorrhea. Negative for ear pain, nosebleeds, congestion, sore throat, sneezing, trouble swallowing, dental problem, postnasal drip and sinus pressure.   Eyes: Negative for redness and itching.  Respiratory: Positive for cough, shortness of breath and wheezing. Negative for chest tightness.   Cardiovascular: Positive for leg swelling. Negative for palpitations.  Gastrointestinal: Negative for nausea and vomiting.  Genitourinary: Negative for dysuria.  Musculoskeletal: Negative for joint swelling.  Skin: Negative for rash.  Neurological: Negative for headaches.  Hematological: Does not bruise/bleed easily.  Psychiatric/Behavioral: Negative for dysphoric mood. The patient is not nervous/anxious.        Objective:   Physical Exam Obese male in nad No skin breakdown or pressure necrosis from cpap mask On purulence or discharge noted. LE with no edema, no cyanosis noted. Alert, oriented,  Does not appear sleepy, moves all 4        Assessment & Plan:

## 2011-01-28 NOTE — Patient Instructions (Signed)
Continue with cpap, and work on weight loss Will have your machine put on auto pressure for the next 2 weeks to optimize your pressure for you.  I will call with the results. followup with me in 6mos , but call if having issues.

## 2011-01-31 ENCOUNTER — Encounter (INDEPENDENT_AMBULATORY_CARE_PROVIDER_SITE_OTHER): Payer: Self-pay | Admitting: General Surgery

## 2011-03-15 ENCOUNTER — Telehealth: Payer: Self-pay | Admitting: *Deleted

## 2011-03-15 NOTE — Telephone Encounter (Signed)
Per MD order has been done [no notation in pt's chart]

## 2011-03-15 NOTE — Telephone Encounter (Signed)
What is the question? - order was signed and faxed this AM.

## 2011-03-15 NOTE — Telephone Encounter (Signed)
Caller inquiring about status of Order faxed to increase patient's diabetic testing from once a day to twice a day. Pt Reference #[id number]

## 2011-04-28 ENCOUNTER — Other Ambulatory Visit: Payer: Self-pay | Admitting: Pulmonary Disease

## 2011-04-28 DIAGNOSIS — G4733 Obstructive sleep apnea (adult) (pediatric): Secondary | ICD-10-CM

## 2011-05-08 ENCOUNTER — Other Ambulatory Visit: Payer: Self-pay | Admitting: Pulmonary Disease

## 2011-05-08 DIAGNOSIS — G4733 Obstructive sleep apnea (adult) (pediatric): Secondary | ICD-10-CM

## 2011-05-11 ENCOUNTER — Encounter (INDEPENDENT_AMBULATORY_CARE_PROVIDER_SITE_OTHER): Payer: Self-pay | Admitting: General Surgery

## 2011-05-23 ENCOUNTER — Ambulatory Visit (INDEPENDENT_AMBULATORY_CARE_PROVIDER_SITE_OTHER): Payer: Medicare Other | Admitting: Internal Medicine

## 2011-05-23 ENCOUNTER — Encounter: Payer: Self-pay | Admitting: Internal Medicine

## 2011-05-23 ENCOUNTER — Other Ambulatory Visit: Payer: Medicare Other

## 2011-05-23 DIAGNOSIS — G4733 Obstructive sleep apnea (adult) (pediatric): Secondary | ICD-10-CM

## 2011-05-23 DIAGNOSIS — I10 Essential (primary) hypertension: Secondary | ICD-10-CM

## 2011-05-23 DIAGNOSIS — E119 Type 2 diabetes mellitus without complications: Secondary | ICD-10-CM

## 2011-05-23 MED ORDER — METFORMIN HCL ER 500 MG PO TB24: 500.0000 mg | ORAL_TABLET | Freq: Every day | ORAL | Status: DC

## 2011-05-23 MED ORDER — LOSARTAN POTASSIUM 50 MG PO TABS: 50.0000 mg | ORAL_TABLET | Freq: Two times a day (BID) | ORAL | Status: DC

## 2011-05-23 NOTE — Assessment & Plan Note (Signed)
On metformin for same since 07/2010- also ARB Check a1c now and adjust prn Consider need for low dose statin Lab Results  Component Value Date   HGBA1C 6.8* 08/04/2010

## 2011-05-23 NOTE — Assessment & Plan Note (Signed)
Doing well on CPAP since summer 2012 - follows with pulm for same Continue to work with weight loss

## 2011-05-23 NOTE — Progress Notes (Signed)
  Subjective:    Patient ID: [name], male    DOB: 03/08/1946, 65 y.o.   MRN: [id number]  HPI  Here for follow up - reviewed chronic medical issues:  umbilical hernia -  to be sched elective once chronic med issues are controlled   OSA - working with pulm and started CPAP for same 12/2010 - sleeping improved, less fatigue  DM2 - dx 2005  - on metformin since 07/2010- checks sugars 1-2x/day  HTN - on ARB since 07/2010 - denies CP or HA but chronic edema BLE -   EtOH abuse - daily beer - cutting back on use in past 1 month - now 1-2 beer/day - denies WD symptoms or prior DTs - no known liver complications or hx legal/employer issues with alcohol   gout - no swelling or recent trauma/injury; +hx prior flares - improved with self-tx cherry juice   GERD - no meds for same regularly - episode indigestion 3-4x/week - relived with tums as needed - no abd pain or change in stool, no melena or brbpr hx   Past Medical History  Diagnosis Date  . Type II or unspecified type diabetes mellitus without mention of complication, not stated as uncontrolled   . Unspecified essential hypertension   . Esophageal reflux   . Gout   . OSA (obstructive sleep apnea)     cpap started 12/2010  . Arthritis    Review of Systems  Constitutional: Negative for fever and unexpected weight change.  Respiratory: Negative for shortness of breath and wheezing.   Cardiovascular: Negative for chest pain and leg swelling.       Objective:   Physical Exam BP 130/62  Pulse 68  Temp(Src) 98.6 F (37 C) (Oral)  Ht 5\' 8"  (1.727 m)  Wt 268 lb 12.8 oz (121.927 kg)  BMI 40.87 kg/m2  SpO2 94% Wt Readings from Last 3 Encounters:  05/23/11 268 lb 12.8 oz (121.927 kg)  01/28/11 260 lb 9.6 oz (118.207 kg)  12/20/10 260 lb 12.8 oz (118.298 kg)    Constitutional:  Obese; appears well-developed and well-nourished. No distress. Family at side - generalized poor hygeine Neck: Normal range of motion. Thick. Neck  supple. No JVD present. No thyromegaly present.  Cardiovascular: Normal rate, regular rhythm and normal heart sounds.  No murmur heard. no BLE edema Pulmonary/Chest: Effort normal and breath sounds normal. No respiratory distress. no wheezes.  Abdominal: Soft. Bowel sounds are normal. Patient exhibits no distension. There is no tenderness. unchanged umbilical hernia, no incarceration sx Skin: Skin is warm and dry.  No erythema or ulceration.  Psychiatric: he has a normal mood and affect. behavior is normal. Judgment and thought content normal.   Lab Results  Component Value Date   WBC 6.1 07/24/2010   HGB 14.1 07/24/2010   HCT 41.5 07/24/2010   PLT 131* 07/24/2010   CHOL 187 08/04/2010   TRIG 137.0 08/04/2010   HDL 35.50* 08/04/2010   ALT 19 07/24/2010   AST 30 07/24/2010   NA 137 07/24/2010   K 4.0 07/24/2010   CL 103 07/24/2010   CREATININE 0.73 07/24/2010   BUN 9 07/24/2010   CO2 27 07/24/2010   HGBA1C 6.8* 08/04/2010       Assessment & Plan:  See problem list. Medications and labs reviewed today.

## 2011-05-23 NOTE — Patient Instructions (Signed)
It was good to see you today. We have reviewed your prior records including labs and tests today Test(s) ordered today. Your results will be called to you after review (48-72hours after test completion). If any changes need to be made, you will be notified at that time. Work on lifestyle changes as discussed (low fat, low carb, increased protein diet; improved exercise efforts; weight loss) to control sugar, blood pressure and cholesterol levels and/or reduce risk of developing other medical problems. Look into [email or web address] or other type of food journal to assist you in this process. Please schedule followup in 4-6 months for diabetes mellitus check, call sooner if problems.

## 2011-05-23 NOTE — Assessment & Plan Note (Signed)
BP Readings from Last 3 Encounters:  05/23/11 130/62  01/28/11 142/84  12/20/10 148/70   On ARB since 07/2010 - The current medical regimen is effective;  continue present plan and medications.

## 2011-05-24 ENCOUNTER — Ambulatory Visit: Payer: Medicare Other

## 2011-05-24 DIAGNOSIS — E119 Type 2 diabetes mellitus without complications: Secondary | ICD-10-CM

## 2011-05-24 LAB — HEMOGLOBIN A1C: Hgb A1c MFr Bld: 6.2 % (ref 4.6–6.5)

## 2011-05-24 LAB — CREATININE, SERUM: Creatinine, Ser: 0.7 mg/dL (ref 0.4–1.5)

## 2011-05-24 LAB — LIPID PANEL: Cholesterol: 173 mg/dL (ref 0–200)

## 2011-05-27 ENCOUNTER — Ambulatory Visit (INDEPENDENT_AMBULATORY_CARE_PROVIDER_SITE_OTHER): Payer: Medicare Other | Admitting: General Surgery

## 2011-05-27 ENCOUNTER — Encounter (INDEPENDENT_AMBULATORY_CARE_PROVIDER_SITE_OTHER): Payer: Self-pay | Admitting: General Surgery

## 2011-05-27 VITALS — BP 148/86 | HR 68 | Temp 97.6°F | Resp 20 | Ht 68.0 in | Wt 261.8 lb

## 2011-05-27 DIAGNOSIS — K420 Umbilical hernia with obstruction, without gangrene: Secondary | ICD-10-CM

## 2011-05-27 NOTE — Progress Notes (Signed)
Chief Complaint  Patient presents with  . Follow-up    reck umb and ventral hernia.Marland Kitchen waiting to do sx till sugar in check    HPI [name] is a 65 y.o. male.   HPI This 65 year old morbidly obese Caucasian male comes in for long-term followup. I last saw him in March 2012. He has a known umbilical hernia which is somewhat large. I initially met him in December 2011. At that time he did not have a medical physician. He has been seeing his primary care doctor on a regular basis. He states that he is having more tenderness around his bellybutton now. He denies any nausea or vomiting. He denies any diarrhea or constipation. He denies any melena or hematochezia. He states that he is just having more soreness around his umbilicus. He did lose some weight over the past several months however he gained all back. Past Medical History  Diagnosis Date  . Type II or unspecified type diabetes mellitus without mention of complication, not stated as uncontrolled   . Unspecified essential hypertension   . Esophageal reflux   . Gout   . OSA (obstructive sleep apnea)     cpap started 12/2010  . Arthritis   . Umbilical hernia     Past Surgical History  Procedure Date  . Appendectomy 04/2004  . Colon surgery   . Leg surgery   . Kidney stone surgery   . Ankle surgery     Family History  Problem Relation Age of Onset  . Coronary artery disease    . Diabetes    . Colon cancer    . Lung cancer Sister   . Cancer Sister     lung  . Cancer Brother     lung  . Cancer Sister     lung    Social History History  Substance Use Topics  . Smoking status: Former Smoker -- 2.0 packs/day for 40 years    Quit date: 08/29/1996  . Smokeless tobacco: Not on file  . Alcohol Use: Yes     2 BEERS A DAY    No Known Allergies  Current Outpatient Prescriptions  Medication Sig Dispense Refill  . glucose blood test strip 1 each by Other route as needed. Use as instructed       . ibuprofen  (ADVIL,MOTRIN) 200 MG tablet Take 200 mg by mouth every 6 (six) hours as needed.        . Lancets 28G MISC by Does not apply route.        Marland Kitchen losartan (COZAAR) 50 MG tablet Take 1 tablet (50 mg total) by mouth 2 (two) times daily.  30 tablet  3  . metFORMIN (GLUCOPHAGE-XR) 500 MG 24 hr tablet Take 1 tablet (500 mg total) by mouth daily with breakfast.  30 tablet  3    Review of Systems Review of Systems  Constitutional: Negative for fever, chills, appetite change and unexpected weight change.  HENT: Negative for hearing loss, sneezing and neck pain.   Eyes: Negative for photophobia and visual disturbance.  Respiratory: Negative for shortness of breath, wheezing and stridor.        +OSA on CPAP  Cardiovascular: Negative for chest pain, palpitations and leg swelling.       No SOB, No DOE, no PND  Gastrointestinal:       See hpi  Genitourinary: Negative for hematuria and difficulty urinating.  Musculoskeletal: Negative for joint swelling and arthralgias.  Skin: Negative.   Neurological: Negative.  Hematological: Negative.   Psychiatric/Behavioral: Negative.     Blood pressure 148/86, pulse 68, temperature 97.6 F (36.4 C), temperature source Temporal, resp. rate 20, height 5\' 8"  (1.727 m), weight 261 lb 12.8 oz (118.752 kg).  Physical Exam Physical Exam  Vitals reviewed. Constitutional: He appears well-developed and well-nourished.       Morbidly obese, unkempt  HENT:  Head: Normocephalic and atraumatic.  Eyes: Conjunctivae are normal. No scleral icterus.  Neck: Normal range of motion. Neck supple. No JVD present. No tracheal deviation present.  Cardiovascular: Normal rate, regular rhythm and normal heart sounds.   Pulmonary/Chest: Effort normal and breath sounds normal. No respiratory distress. He has no wheezes.  Abdominal: Soft. There is no tenderness. There is no rebound and no guarding. A hernia is present. Hernia confirmed positive in the ventral area.         Partially  reducible umbilical hernia; skin thinned over area; no cellulitis. No fluid in hernia. About 3.5 x 3.5cm defect. Hernia sticks out like a lemon, soft  Lymphadenopathy:    He has no cervical adenopathy.  Skin: No ecchymosis, no lesion and no rash noted.    Data Reviewed My note from 11/04/10, 08/03/2010 His PCP's note from 04/2011 Hgb A1C 6.2 in 04/2011  Assessment    Morbid obesity Incarcerated umbilical hernia DM 2 HTN OSA GERD    Plan    It appears he is becoming more symptomatic. He is having more pain around his umbilicus. Ideally I would like for him to have lost weight. We discussed proceeding to the operating room for repair of his umbilical hernia.   I described the procedure in detail.  The patient was given Agricultural engineer. We discussed the risks and benefits including but not limited to bleeding, infection, chronic pain, nerve entrapment, hernia recurrence, mesh complications, hematoma formation, urinary retention, injury to surrounding structures,  and anesthesia risk. We also discussed the typical post operative recovery course, including no heavy lifting for 6 weeks.  He understands he is at higher risk for recurrence given his morbid obesity    We discussed both open and laparoscopic techniques. We discussed the risk and benefits of each type of repair. The patient has elected to proceed to the operating room for a laparoscopic umbilical hernia repair. In the interim we will obtain a CT scan of his abdomen and pelvis to help [place] with surgical planning.    Gaynelle Adu M 05/27/2011, 11:31 AM

## 2011-05-27 NOTE — Patient Instructions (Signed)
We will get a CT scan of your abdomen to help with planning your hernia repair  Hernia, Repair with Laparoscope A hernia occurs when an internal organ pushes out through a weak spot in the belly (abdominal) wall muscles. Hernias most commonly occur in the groin and around the navel. Hernias can also occur through a cut by the surgeon (incision) after an abdominal operation. A hernia may be caused by:  Lifting heavy objects.   Prolonged coughing.   Straining to move your bowels.  Hernias can often be pushed back into place (reduced). Most hernias tend to get worse over time. Problems occur when abdominal contents get stuck in the opening and the blood supply is blocked or impaired (incarcerated hernia). Because of these risks, you require surgery to repair the hernia. Your hernia will be repaired using a laparoscope. Laparoscopic surgery is a type of minimally invasive surgery. It does not involve making a typical surgical cut (incision) in the skin. A laparoscope is a telescope-like rod and lens system. It is usually connected to a video camera and a light source so your caregiver can clearly see the operative area. The instruments are inserted through  to  inch (5 mm or 10 mm) openings in the skin at specific locations. A working and viewing space is created by blowing a small amount of carbon dioxide gas into the abdominal cavity. The abdomen is essentially blown up like a balloon (insufflated). This elevates the abdominal wall above the internal organs like a dome. The carbon dioxide gas is common to the human body and can be absorbed by tissue and removed by the respiratory system. Once the repair is completed, the small incisions will be closed with either stitches (sutures) or staples (just like a paper stapler only this staple holds the skin together). BEFORE THE PROCEDURE Laparoscopy can be done either in a hospital or out-patient clinic. You may be given a mild sedative to help you relax  before the procedure. Once in the operating room, you will be given a general anesthesia to make you sleep (unless you and your caregiver choose a different anesthetic).  LET YOUR CAREGIVERS KNOW ABOUT THE FOLLOWING:  Allergies.  Medications taken including herbs, eye drops, over the counter medications, and creams.   Use of steroids (by mouth or creams).   Previous problems with anesthetics or Novocaine.   Possibility of pregnancy, if this applies.  History of blood clots (thrombophlebitis).   History of bleeding or blood problems.   Previous surgery.   Other health problems.   AFTER THE PROCEDURE After the procedure you will be watched in a recovery area. Depending on what type of hernia was repaired, you might be admitted to the hospital or you might go home the same day. With this procedure you may have less pain and scarring. This usually results in a quicker recovery and less risk of infection. HOME CARE INSTRUCTIONS  Bed rest is not required. You may continue your normal activities but avoid heavy lifting (more than 10 pounds) or straining.   Cough gently. If you are a smoker it is best to stop, as even the best hernia repair can break down with the continual strain of coughing.   Avoid driving until given the OK by your surgeon.   There are no dietary restrictions unless given otherwise.   TAKE ALL MEDICATIONS AS DIRECTED.   Only take over-the-counter or prescription medicines for pain, discomfort, or fever as directed by your caregiver.  SEEK MEDICAL CARE  IF:  There is increasing abdominal pain or pain in your incisions.   There is more bleeding from incisions, other than minimal spotting.   You feel light headed or faint.   You develop an unexplained temperature, chills, and/or an oral temperature above 101.   You have redness, swelling, or increasing pain in the wound.   Pus coming from wound.   A foul smell coming from the wound or dressings.  SEEK  IMMEDIATE MEDICAL CARE IF:  You develop a rash.   You have difficulty breathing.   You have any allergic problems.  MAKE SURE YOU:   Understand these instructions.   Will watch your condition.   Will get help right away if you are not doing well or get worse.  Document Released: 08/15/2005 Document Re-Released: 08/03/2009 [facility] Patient Information 2011 [place], [place].

## 2011-06-02 ENCOUNTER — Ambulatory Visit
Admission: RE | Admit: 2011-06-02 | Discharge: 2011-06-02 | Disposition: A | Discharge status: Home / Self Care | Payer: Medicare Other | Source: Ambulatory Visit | Attending: General Surgery | Admitting: General Surgery

## 2011-06-02 ENCOUNTER — Other Ambulatory Visit (INDEPENDENT_AMBULATORY_CARE_PROVIDER_SITE_OTHER): Payer: Self-pay | Admitting: General Surgery

## 2011-06-02 ENCOUNTER — Ambulatory Visit (HOSPITAL_COMMUNITY)
Admission: RE | Admit: 2011-06-02 | Discharge: 2011-06-02 | Disposition: A | Discharge status: Home / Self Care | Payer: Medicare Other | Source: Ambulatory Visit | Attending: General Surgery | Admitting: General Surgery

## 2011-06-02 ENCOUNTER — Encounter (HOSPITAL_COMMUNITY)
Admission: RE | Admit: 2011-06-02 | Discharge: 2011-06-02 | Disposition: A | Discharge status: Home / Self Care | Payer: Medicare Other | Source: Ambulatory Visit | Attending: General Surgery | Admitting: General Surgery

## 2011-06-02 DIAGNOSIS — I517 Cardiomegaly: Secondary | ICD-10-CM | POA: Insufficient documentation

## 2011-06-02 DIAGNOSIS — Z01810 Encounter for preprocedural cardiovascular examination: Secondary | ICD-10-CM | POA: Insufficient documentation

## 2011-06-02 DIAGNOSIS — Z01811 Encounter for preprocedural respiratory examination: Secondary | ICD-10-CM

## 2011-06-02 DIAGNOSIS — I10 Essential (primary) hypertension: Secondary | ICD-10-CM | POA: Insufficient documentation

## 2011-06-02 DIAGNOSIS — Z01812 Encounter for preprocedural laboratory examination: Secondary | ICD-10-CM | POA: Insufficient documentation

## 2011-06-02 DIAGNOSIS — R0602 Shortness of breath: Secondary | ICD-10-CM | POA: Insufficient documentation

## 2011-06-02 DIAGNOSIS — K420 Umbilical hernia with obstruction, without gangrene: Secondary | ICD-10-CM

## 2011-06-02 LAB — CBC
HCT: 39.3 % (ref 39.0–52.0)
Hemoglobin: 13.2 g/dL (ref 13.0–17.0)
MCH: 29.2 pg (ref 26.0–34.0)
MCHC: 33.6 g/dL (ref 30.0–36.0)
MCV: 86.9 fL (ref 78.0–100.0)
RDW: 13.9 % (ref 11.5–15.5)

## 2011-06-02 LAB — DIFFERENTIAL
Eosinophils Relative: 2 % (ref 0–5)
Lymphocytes Relative: 19 % (ref 12–46)
Monocytes Absolute: 0.5 10*3/uL (ref 0.1–1.0)
Monocytes Relative: 8 % (ref 3–12)
Neutro Abs: 4.9 10*3/uL (ref 1.7–7.7)

## 2011-06-02 LAB — BASIC METABOLIC PANEL
BUN: 14 mg/dL (ref 6–23)
CO2: 29 mEq/L (ref 19–32)
Chloride: 101 mEq/L (ref 96–112)
Creatinine, Ser: 0.77 mg/dL (ref 0.50–1.35)
GFR calc Af Amer: >90 mL/min (ref >90)
Glucose, Bld: 175 mg/dL — ABNORMAL HIGH (ref 70–99)
Potassium: 4.6 mEq/L (ref 3.5–5.1)

## 2011-06-02 MED ORDER — IOHEXOL 300 MG/ML  SOLN
125.0000 mL | Freq: Once | INTRAMUSCULAR | Status: AC | PRN
  Administered 2011-06-02: 125 mL via INTRAVENOUS

## 2011-06-06 ENCOUNTER — Ambulatory Visit (HOSPITAL_COMMUNITY)
Admission: RE | Admit: 2011-06-06 | Discharge: 2011-06-08 | Disposition: A | Discharge status: Home / Self Care | Payer: Medicare Other | Source: Ambulatory Visit | Attending: General Surgery | Admitting: General Surgery

## 2011-06-06 ENCOUNTER — Telehealth: Payer: Self-pay | Admitting: *Deleted

## 2011-06-06 ENCOUNTER — Telehealth (INDEPENDENT_AMBULATORY_CARE_PROVIDER_SITE_OTHER): Payer: Self-pay | Admitting: General Surgery

## 2011-06-06 DIAGNOSIS — E119 Type 2 diabetes mellitus without complications: Secondary | ICD-10-CM | POA: Insufficient documentation

## 2011-06-06 DIAGNOSIS — K420 Umbilical hernia with obstruction, without gangrene: Secondary | ICD-10-CM | POA: Insufficient documentation

## 2011-06-06 DIAGNOSIS — Z01818 Encounter for other preprocedural examination: Secondary | ICD-10-CM | POA: Insufficient documentation

## 2011-06-06 DIAGNOSIS — I10 Essential (primary) hypertension: Secondary | ICD-10-CM | POA: Insufficient documentation

## 2011-06-06 DIAGNOSIS — Z01810 Encounter for preprocedural cardiovascular examination: Secondary | ICD-10-CM | POA: Insufficient documentation

## 2011-06-06 DIAGNOSIS — G4733 Obstructive sleep apnea (adult) (pediatric): Secondary | ICD-10-CM | POA: Insufficient documentation

## 2011-06-06 DIAGNOSIS — Z01812 Encounter for preprocedural laboratory examination: Secondary | ICD-10-CM | POA: Insufficient documentation

## 2011-06-06 DIAGNOSIS — M109 Gout, unspecified: Secondary | ICD-10-CM | POA: Insufficient documentation

## 2011-06-06 DIAGNOSIS — K219 Gastro-esophageal reflux disease without esophagitis: Secondary | ICD-10-CM | POA: Insufficient documentation

## 2011-06-06 DIAGNOSIS — M129 Arthropathy, unspecified: Secondary | ICD-10-CM | POA: Insufficient documentation

## 2011-06-06 HISTORY — PX: OTHER SURGICAL HISTORY: SHX169

## 2011-06-06 LAB — GLUCOSE, CAPILLARY
Glucose-Capillary: 121 mg/dL — ABNORMAL HIGH (ref 70–99)
Glucose-Capillary: 160 mg/dL — ABNORMAL HIGH (ref 70–99)
Glucose-Capillary: 166 mg/dL — ABNORMAL HIGH (ref 70–99)

## 2011-06-06 MED ORDER — LOSARTAN POTASSIUM 50 MG PO TABS: 50.0000 mg | ORAL_TABLET | Freq: Two times a day (BID) | ORAL | Status: DC

## 2011-06-06 NOTE — Telephone Encounter (Signed)
Labs okay for surgery faxed to pre-op.  

## 2011-06-06 NOTE — Telephone Encounter (Signed)
Faxed info back to [facility]. Will send updated rx...06/06/11@1 :11pm/LMB

## 2011-06-06 NOTE — Telephone Encounter (Signed)
Yes - pt prefers to take cozaar 50mg  BID, not 100 qd -  RX is correct - (but prev only sent 30, need 60/month) thanks

## 2011-06-06 NOTE — Telephone Encounter (Signed)
Message copied by Liliana Cline on Mon Jun 06, 2011  8:55 AM ------      Message from: Andrey Campanile, ERIC M      Created: Fri Jun 03, 2011  4:51 PM       Labs ok for surgery

## 2011-06-06 NOTE — Telephone Encounter (Signed)
Received fax stating need to verify directions on cozaar. Pt currently on 50mg  daily received script for twice a day...06/06/11@10 :39am/LMB

## 2011-06-07 LAB — GLUCOSE, CAPILLARY
Glucose-Capillary: 119 mg/dL — ABNORMAL HIGH (ref 70–99)
Glucose-Capillary: 145 mg/dL — ABNORMAL HIGH (ref 70–99)
Glucose-Capillary: 147 mg/dL — ABNORMAL HIGH (ref 70–99)
Glucose-Capillary: 172 mg/dL — ABNORMAL HIGH (ref 70–99)

## 2011-06-09 LAB — GLUCOSE, CAPILLARY: Glucose-Capillary: 151 mg/dL — ABNORMAL HIGH (ref 70–99)

## 2011-06-13 ENCOUNTER — Telehealth (INDEPENDENT_AMBULATORY_CARE_PROVIDER_SITE_OTHER): Payer: Self-pay | Admitting: General Surgery

## 2011-06-13 NOTE — Telephone Encounter (Signed)
Appt made for 07/13/11. Patient made aware.

## 2011-06-14 NOTE — Op Note (Signed)
[name], [name] NO.:  [id number]  MEDICAL RECORD NO.:  [id number]  LOCATION:  5123                         FACILITY:  [facility]  PHYSICIAN:  Mary Sella. Andrey Campanile, MD     DATE OF BIRTH:  1946-04-16  DATE OF PROCEDURE:  06/06/2011 DATE OF DISCHARGE:                              OPERATIVE REPORT   PREOPERATIVE DIAGNOSIS:  Incarcerated umbilical hernia.  POSTOPERATIVE DIAGNOSIS:  Incarcerated umbilical hernia.  PROCEDURE:  Laparoscopic repair of incarcerated umbilical hernia with mesh.  SURGEON:  Mary Sella. Andrey Campanile, MD  ASSISTANT SURGEON:  None.  ANESTHESIA:  General plus local consisting of 0.25% Marcaine with epinephrine.  FINDINGS:  There was a large plug of omentum that was incarcerated through the umbilical defect.  I added an extra port in order to reduce the omental plug.  It took about 15 minutes to reduce it.  The fascial defect measured 5 cm x 5 cm.  I added 5 cm in a circumferential manner measurement to give a 5 cm fascial overlap.  This roughly gave a mesh requirement of a 15 x 15 cm.  I therefore used a 20 cm x 15 cm Parietex PCO mesh.  It was secured with transfascial sutures as well as with Ethicon secure strap tacks.  ESTIMATED BLOOD LOSS:  Minimal.  SPECIMEN:  None.  INDICATIONS FOR PROCEDURE:  The patient is a morbidly obese Caucasian male who had a known umbilical hernia for quite some years.  I initially saw him last year.  At that time, he did not have a medical home.  He was diabetic and hypertensive, which were both uncontrolled.  I recommended getting those medical issues under control as well as losing weight since he was asymptomatic at that time.  He came back to see me short about a week and a half ago with more and more tenderness at his belly button.  His blood sugars and blood pressure were better, however, he had not lost any weight.  Because he was more symptomatic, we discussed the pros and cons of proceeding to the  operating room for repair.  We discussed the pros and cons of laparoscopic versus open. The patient elected to proceed to the operating room for laparoscopic repair.  We discussed the risks and benefits of surgery including, but not limited to bleeding, infection, injury to surrounding structures, mesh complications, blood clot formation, pulmonary complications given his obstructive sleep apnea, long term abdominal wall pain, general anesthesia risk, hernia recurrence, the need to convert to an open procedure, cosmesis concerns as well as ileus.  I explained that with surgery he should expect a good improvement in his symptoms. However, I did explain to him and his son that he was at a higher risk for recurrence given his morbid obesity & diabetes. He elected to proceed to the operating  room.  DESCRIPTION OF PROCEDURE:  After obtaining informed consent, the patient was brought back to the operating room, placed supine on the operating room table.  Sequential compression devices were placed.  It should be noted the patient got subcutaneous heparin prior to skin incision within 1-2 hours of surgery.  He received antibiotics prior to skin  incision. Surgical time-out was performed.  His abdomen was prepped and draped in usual standard surgical fashion with ChloraPrep.  I gained entry to his abdomen in the left upper quadrant using an Optiview technique.  This was done by making a skin incision of 0.5 cm, #11 blade and using a 0 degree 5-mm scope through a 5-mm trocar and advanced it through all layers of the abdominal wall under direct visualization and smoothly entered the abdominal cavity.  Pneumoperitoneum was smoothly established up to a patient pressure of 15 mmHg.  The laparoscope was surveilled. There was a large omental plug within the umbilical defect.  In his right lower quadrant, he had had a prior open appendectomy, and there was a small bowel adhered to that incision in the right  lower quadrant. I added an 11-mm trocar in the left lateral abdominal wall under direct visualization after local.  I tried to reduce the plug of omentum with just using 1 trocar site.  I was able to reduce part of it, but there were several omental vessels that were starting to ooze; therefore, I added a 5-mm trocar in the left lower quadrant all under direct visualization, and I used this to help reduce it with 2 graspers.  I also used a Harmonic Scalpel to seal some of the omental vessels.  There was no bowel within the hernial defect.  After about 15 minutes, I got the omental plug reduced.  Then using a spinal gauge needle, I marked the edges of the fascia.  The fascial edges were about 5 cm x 5 cm apart, marked this on the skin then using a ruler, I added 5 cm in a circumferential manner out from that to give me a 5-cm fascial overlap. I did not need to take down the adhesions in the right lower quadrant since there was bowel adhered to the abdominal wall in that area.  I elected to leave it alone.  I obtained a piece of Parietex mesh 15 x 20 cm.  I placed a #1 Novafils through the mesh and tied them down leaving long sutures.  I then rolled the mesh up like a cigar and placed it into the abdominal cavity and uncurled it in the proper orientation and then made eight circumferential nicks around the premarked area on the abdominal wall and brought up each of the eight transfascial sutures and secured it with hemostats.  Once all eight tails had been brought up through this fascia and through the skin, I lifted up so that the mesh would be flushed with the abdominal wall.  There was little-to-no redundancy except a little bit in the right lower quadrant near the adhesions in the right lower quadrant.  It is very minimal.  I elected to leave those adhesions alone.  I then tied down all 8 transfascial sutures.  Then using an Ethicon secure strap tacker, I tacked the mesh to the  abdominal wall in a circumferential manner along the periphery of the mesh about 1 cm apart.  I used a 1-1/2 secure strap tacking devices. I did leave a little in the right lower quadrant with the mesh was near the adhesions.  I did not put the tack on the edge I brought them up about 2 cm inward to ensure that there was no tacks that went through the adhesions.  At this point, the mesh was secured to the abdominal wall.  There was very little to no redundancy.  It was secured to the  abdominal wall in two layers.  At this point, I removed the 11-mm trocar, I passed a 0 Vicryl using the suture passer and tied down that to obliterate the fascial defect at the level of the trocar site.  This was viewed laparoscopically.  Some local was placed in this area.  At this point, pneumoperitoneum was released and the remaining two 5-mm trocars were removed.  The patient's umbilical hernia had been quite large and then quite there for quite sometime, and there was a large amount of redundant skin at his umbilicus about the size of a baseball. I made an elliptical incision and excised the redundant skin at the umbilicus and as well as some of the hernia sac.  This left some exposed mesh at the base of the wound.  I elected to bring the fascia together in a transverse fashion in order to protect the mesh.  I closed the fascia in a transverse fashion using 4 interrupted 0 Novafil sutures.  I then closed the wound in 2 layers using 3-0 Vicryl in an interrupted inverted fashion and then a 4-0 Monocryl in a subcuticular fashion followed by application of Dermabond.  All nicks where the transfascial sutures had been brought up to the abdominal wall were closed with Dermabond, and Dermabond was placed on 3 trocar sites.  All needle, instrument, and sponge counts were correct x2.  The patient tolerated the procedure well.  He was extubated and taken to recovery room in stable condition.     Mary Sella.  Andrey Campanile, MD     EMW/MEDQ  D:  06/06/2011  T:  06/06/2011  Job:  [id number]  cc:   Vikki Ports A. Felicity Coyer, MD Barbaraann Share, MD,FCCP  Electronically Signed by Gaynelle Adu M.D. on 06/14/2011 04:51:25 PM

## 2011-07-13 ENCOUNTER — Ambulatory Visit (INDEPENDENT_AMBULATORY_CARE_PROVIDER_SITE_OTHER): Payer: Medicare Other | Admitting: General Surgery

## 2011-07-13 ENCOUNTER — Encounter (INDEPENDENT_AMBULATORY_CARE_PROVIDER_SITE_OTHER): Payer: Self-pay | Admitting: General Surgery

## 2011-07-13 VITALS — BP 156/92 | HR 68 | Temp 96.7°F | Resp 20 | Ht 68.0 in | Wt 265.2 lb

## 2011-07-13 DIAGNOSIS — Z8719 Personal history of other diseases of the digestive system: Secondary | ICD-10-CM

## 2011-07-13 DIAGNOSIS — Z09 Encounter for follow-up examination after completed treatment for conditions other than malignant neoplasm: Secondary | ICD-10-CM

## 2011-07-13 DIAGNOSIS — Z9889 Other specified postprocedural states: Secondary | ICD-10-CM

## 2011-07-13 MED ORDER — CYCLOBENZAPRINE HCL 10 MG PO TABS: 10.0000 mg | ORAL_TABLET | Freq: Three times a day (TID) | ORAL | Status: AC | PRN

## 2011-07-13 MED ORDER — HYDROCODONE-ACETAMINOPHEN 5-500 MG PO TABS: 1.0000 | ORAL_TABLET | Freq: Four times a day (QID) | ORAL | Status: AC | PRN

## 2011-07-13 NOTE — Progress Notes (Signed)
Chief complaint: Postop  Procedure: Status post laparoscopic repair of incarcerated umbilical hernia with mesh June 06, 2011  History of Present Ilness: 65 year old morbidly obese Caucasian male comes in for his first postoperative appointment. He denies any fevers, chills, nausea, vomiting, diarrhea, or constipation. He denies any problems his incisions. He states that he has some occasional pain on the left side of his abdomen he describes as [place] and shooting. It only lasts for a couple minutes but it will occur several times a day. It is mainly with activity.  Physical Exam: BP 156/92  Pulse 68  Temp(Src) 96.7 F (35.9 C) (Temporal)  Resp 20  Ht 5\' 8"  (1.727 m)  Wt 265 lb 3.2 oz (120.294 kg)  BMI 40.32 kg/m2  Well-developed morbidly obese Caucasian male in no apparent distress Pulmonary-lungs are clear Cardiac-regular rate and rhythm Abdomen-soft, nontender, obese. Well-healed trocar incisions. No signs of hernia recurrence. The umbilical incision is well approximated without any signs of cellulitis. He probably has a small hematoma underneath the incision.  Data review: Reviewed my operative note  Assessment and Plan: Status post laparoscopic repair of incarcerated umbilical hernia with mesh  He appears to be doing well. I'm not surprised that he has some pain on the left side. I reminded him that he could have pain for several months. We discussed how the mesh is secured to the abdominal wall.  We excised the redundant skin of the umbilicus at the time of surgery. The skin appears viable. I believe the hematoma will resolve with time.  I gave him a prescription for Vicodin and Flexeril. I reminded him he should not do any heavy lifting for another 2 weeks. I'll see him in 6 weeks.

## 2011-07-13 NOTE — Patient Instructions (Signed)
Do not lift, push, pull anything greater than 15 pounds for another 2 weeks.  May take motrin as well for the pain.

## 2011-08-01 ENCOUNTER — Ambulatory Visit: Payer: Medicare Other | Admitting: Pulmonary Disease

## 2011-08-16 ENCOUNTER — Other Ambulatory Visit: Payer: Self-pay | Admitting: Internal Medicine

## 2011-08-17 ENCOUNTER — Ambulatory Visit: Payer: Medicare Other | Admitting: Pulmonary Disease

## 2011-08-31 ENCOUNTER — Ambulatory Visit: Payer: Medicare Other | Admitting: Pulmonary Disease

## 2011-09-02 ENCOUNTER — Encounter (INDEPENDENT_AMBULATORY_CARE_PROVIDER_SITE_OTHER): Payer: Self-pay | Admitting: General Surgery

## 2011-09-02 ENCOUNTER — Ambulatory Visit (INDEPENDENT_AMBULATORY_CARE_PROVIDER_SITE_OTHER): Payer: Medicare Other | Admitting: General Surgery

## 2011-09-02 VITALS — BP 120/76 | HR 80 | Temp 98.1°F | Resp 20 | Ht 65.5 in | Wt 273.0 lb

## 2011-09-02 DIAGNOSIS — Z09 Encounter for follow-up examination after completed treatment for conditions other than malignant neoplasm: Secondary | ICD-10-CM

## 2011-09-02 NOTE — Patient Instructions (Signed)
Try to lose some weight by limiting how much you eat and increasing your exercise activity

## 2011-09-02 NOTE — Progress Notes (Signed)
Chief complaint: Postop  Procedure: Status post laparoscopic repair of incarcerated umbilical hernia with mesh June 06, 2011  History of Present Ilness: 66 year old morbidly obese Caucasian male comes in for his second postoperative appointment. He denies any fevers, chills, nausea, vomiting, diarrhea, or constipation. He denies any problems his incisions. He states that he has some occasional pain on the left side of his abdomen he describes as [place] and shooting. It is much improved since his last visit.   Physical Exam: BP 120/76  Pulse 80  Temp(Src) 98.1 F (36.7 C) (Temporal)  Resp 20  Ht 5' 5.5" (1.664 m)  Wt 273 lb (123.832 kg)  BMI 44.74 kg/m2  Well-developed morbidly obese Caucasian male in no apparent distress Pulmonary-lungs are clear Cardiac-regular rate and rhythm Abdomen-soft, nontender, obese. Well-healed trocar incisions. No signs of hernia recurrence. The umbilical incision is well approximated without any signs of cellulitis. The small hematoma underneath the incision has resolved.   Data review: Reviewed my operative note and last visit note  Assessment and Plan: Status post laparoscopic repair of incarcerated umbilical hernia with mesh  He appears to be doing well.   We excised the redundant skin of the umbilicus at the time of surgery. The skin appears viable. The hematoma resolved.  I encouraged to try to lose some weight as this will decrease his chance of hernia recurrence.   F/u prn  Mary Sella. Andrey Campanile, MD, FACS General, Bariatric, & Minimally Invasive Surgery [facility], [place]

## 2011-09-19 ENCOUNTER — Ambulatory Visit: Payer: Medicare Other | Admitting: Internal Medicine

## 2011-09-21 ENCOUNTER — Ambulatory Visit: Payer: Medicare Other | Admitting: Internal Medicine

## 2011-09-22 ENCOUNTER — Ambulatory Visit (INDEPENDENT_AMBULATORY_CARE_PROVIDER_SITE_OTHER): Payer: Medicare Other | Admitting: Pulmonary Disease

## 2011-09-22 ENCOUNTER — Encounter: Payer: Self-pay | Admitting: Pulmonary Disease

## 2011-09-22 ENCOUNTER — Other Ambulatory Visit: Payer: Self-pay | Admitting: Internal Medicine

## 2011-09-22 VITALS — BP 138/68 | HR 79 | Temp 98.0°F | Ht 68.0 in | Wt 273.0 lb

## 2011-09-22 DIAGNOSIS — G4733 Obstructive sleep apnea (adult) (pediatric): Secondary | ICD-10-CM

## 2011-09-22 NOTE — Progress Notes (Signed)
  Subjective:    Patient ID: [name], male    DOB: 1946-03-02, 66 y.o.   MRN: [id number]  HPI Patient in today for follow up of his moderate sleep apnea.  He is not using CPAP on a regular basis, and blames this on a feeling of "smothering".  He has no issues with the mask fit.  He is using the ramp feature, but usually is not asleep by the time it reaches the final pressure.  He hasn't set on the maximum delay time.     Review of Systems  Constitutional: Negative for fever and unexpected weight change.  HENT: Positive for ear pain and rhinorrhea. Negative for nosebleeds, congestion, sore throat, sneezing, trouble swallowing, dental problem, postnasal drip and sinus pressure.   Eyes: Negative for redness and itching.  Respiratory: Positive for cough and shortness of breath. Negative for chest tightness and wheezing.   Cardiovascular: Positive for leg swelling. Negative for palpitations.  Gastrointestinal: Negative for nausea and vomiting.  Genitourinary: Negative for dysuria.  Musculoskeletal: Negative for joint swelling.  Skin: Negative for rash.  Neurological: Negative for headaches.  Hematological: Does not bruise/bleed easily.  Psychiatric/Behavioral: Negative for dysphoric mood. The patient is not nervous/anxious.        Objective:   Physical Exam Obese male in no acute distress No skin breakdown or pressure necrosis from the CPAP mask Lower extremities with moderate edema, no cyanosis Alert and oriented, moves all 4 extremities.  Does not appear to be overly sleepy.       Assessment & Plan:

## 2011-09-22 NOTE — Assessment & Plan Note (Signed)
The patient is having issues with CPAP tolerance, related to to a feeling of "smothering".  He has tried parenteral, but he does not fall asleep before it escalates to the final pressure.  He seemed to have done better on the automatic setting, and we'll therefore change him over to this mode.  He is to call me if he has issues with tolerance.  I have also encouraged him to work aggressively on weight loss.

## 2011-09-22 NOTE — Patient Instructions (Signed)
Will have your cpap machine set on auto setting and leave there.  Hopefully, this will be easier to tolerate. Please call me if you cannot tolerate this, and we will try something different Work on weight loss If doing well with the cpap, followup with me in 6mos.

## 2011-09-26 ENCOUNTER — Encounter: Payer: Self-pay | Admitting: Internal Medicine

## 2011-09-26 ENCOUNTER — Ambulatory Visit (INDEPENDENT_AMBULATORY_CARE_PROVIDER_SITE_OTHER): Payer: Medicare Other | Admitting: Internal Medicine

## 2011-09-26 ENCOUNTER — Other Ambulatory Visit (INDEPENDENT_AMBULATORY_CARE_PROVIDER_SITE_OTHER): Payer: Medicare Other

## 2011-09-26 DIAGNOSIS — E119 Type 2 diabetes mellitus without complications: Secondary | ICD-10-CM

## 2011-09-26 DIAGNOSIS — Z1211 Encounter for screening for malignant neoplasm of colon: Secondary | ICD-10-CM

## 2011-09-26 DIAGNOSIS — I10 Essential (primary) hypertension: Secondary | ICD-10-CM

## 2011-09-26 DIAGNOSIS — Z0000 Encounter for general adult medical examination without abnormal findings: Secondary | ICD-10-CM

## 2011-09-26 DIAGNOSIS — Z23 Encounter for immunization: Secondary | ICD-10-CM

## 2011-09-26 MED ORDER — METFORMIN HCL 500 MG PO TABS: 500.0000 mg | ORAL_TABLET | Freq: Two times a day (BID) | ORAL | Status: DC

## 2011-09-26 NOTE — Assessment & Plan Note (Signed)
BP Readings from Last 3 Encounters:  09/26/11 130/72  09/22/11 138/68  09/02/11 120/76   On ARB since 07/2010 - The current medical regimen is effective;  continue present plan and medications.

## 2011-09-26 NOTE — Assessment & Plan Note (Addendum)
On metformin for same since 07/2010- also ARB Check a1c now and adjust prn > increase bid now cbgs >170s on home cbg review Consider need for low dose statin Lab Results  Component Value Date   HGBA1C 6.2 05/24/2011

## 2011-09-26 NOTE — Patient Instructions (Addendum)
It was good to see you today. Take metformin (big pill) tablet twice daily - Your prescription(s) have been submitted to your pharmacy. Please take as directed and contact our office if you believe you are having problem(s) with the medication(s). Other Medications reviewed, no other changes at this time.  Pneumonia shot and tetanus booster given today Test(s) ordered today. Your results will be called to you after review (48-72hours after test completion). If any changes need to be made, you will be notified at that time. we'll make referral for your screening colonoscopy. Our office will contact you regarding appointment(s) once made. Continue to work on lifestyle changes as discussed (low fat, low carb, increased protein diet; improved exercise efforts; weight loss) to control sugar, blood pressure and cholesterol levels and/or reduce risk of developing other medical problems. Look into [email or web address] or other type of food journal to assist you in this process. Please schedule followup in 4 months for diabetes mellitus check, call sooner if problems.

## 2011-09-26 NOTE — Progress Notes (Signed)
Subjective:    Patient ID: [name], male    DOB: [birth year], 66 y.o.   MRN: [id number]  HPI  Here for medicare wellness  Diet: heart healthy and diabetic Physical activity: sedentary Depression/mood screen: negative Hearing: intact to whispered voice Visual acuity: grossly normal, performs annual eye exam  ADLs: capable Fall risk: none Home safety: good Cognitive evaluation: intact to orientation, naming, recall and repetition EOL planning: adv directives, full code/ I agree  I have personally reviewed and have noted 1. The patient's medical and social history 2. Their use of alcohol, tobacco or illicit drugs 3. Their current medications and supplements 4. The patient's functional ability including ADL's, fall risks, home safety risks and hearing or visual impairment. 5. Diet and physical activities 6. Evidence for depression or mood disorders   also reviewed chronic medical issues:  umbilical hernia - s/p elective repair summer 2012 - doing well -   OSA - working with pulm and started CPAP for same 12/2010 - sleeping improved, less fatigue  DM2 - dx 2005  - on metformin since 07/2010- checks sugars 1-2x/day - ?stop med tx if well controlled  HTN - on ARB since 07/2010 - denies chest pain or headache but chronic edema BLE -   EtOH abuse - reports cutting back on use> now 1-2 beer/day - denies WD symptoms or prior DTs - no known liver complications or hx legal/employer issues with alcohol   gout - no swelling or recent trauma/injury; +hx prior flares - improved with self-tx cherry juice   GERD - no meds for same regularly - episode indigestion 3-4x/week - relived with tums as needed - no abd pain or change in stool, no melena or brbpr hx   Past Medical History  Diagnosis Date  . Type II or unspecified type diabetes mellitus without mention of complication, not stated as uncontrolled   . Unspecified essential hypertension   . Esophageal reflux   . Gout   . OSA  (obstructive sleep apnea)     cpap started 12/2010  . Arthritis   . Umbilical hernia   . Ventral hernia    Family History  Problem Relation Age of Onset  . Coronary artery disease    . Diabetes    . Colon cancer    . Lung cancer Sister   . Cancer Sister     lung  . Cancer Brother     lung  . Cancer Sister     lung   History  Substance Use Topics  . Smoking status: Former Smoker -- 2.0 packs/day for 40 years    Types: Cigarettes    Quit date: 08/29/1996  . Smokeless tobacco: Not on file  . Alcohol Use: Yes     2 BEERS A DAY    Review of Systems  Constitutional: Negative for fever and unexpected weight change.  Respiratory: Negative for shortness of breath and wheezing.   Cardiovascular: Negative for chest pain and leg swelling.  No other specific complaints in a complete review of systems (except as listed in HPI above).      Objective:   Physical Exam  BP 130/72  Pulse 64  Temp(Src) 97.9 F (36.6 C) (Oral)  Wt 267 lb (121.11 kg)  SpO2 98% Wt Readings from Last 3 Encounters:  09/26/11 267 lb (121.11 kg)  09/22/11 273 lb (123.832 kg)  09/02/11 273 lb (123.832 kg)   Constitutional:  Obese; appears well-developed and well-nourished. No distress. Family at side - generalized poor  hygeine Neck: Normal range of motion. Thick. Neck supple. No JVD present. No thyromegaly present.  Cardiovascular: Normal rate, regular rhythm and normal heart sounds.  No murmur heard. no BLE edema Pulmonary/Chest: Effort normal and breath sounds normal. No respiratory distress. no wheezes.  Abdominal: Soft. Bowel sounds are normal. Patient exhibits no distension. There is no tenderness. resolved umbilical hernia, no recurrence Psychiatric: he has a normal mood and affect. behavior is normal. Judgment and thought content normal.   Lab Results  Component Value Date   WBC 6.9 06/02/2011   HGB 13.2 06/02/2011   HCT 39.3 06/02/2011   PLT 142* 06/02/2011   CHOL 173 05/24/2011   TRIG 134.0  05/24/2011   HDL 43.10 05/24/2011   ALT 19 07/24/2010   AST 30 07/24/2010   NA 136 06/02/2011   K 4.6 06/02/2011   CL 101 06/02/2011   CREATININE 0.77 06/02/2011   BUN 14 06/02/2011   CO2 29 06/02/2011   INR 1.05 06/02/2011   HGBA1C 6.2 05/24/2011       Assessment & Plan:  AWV/v70.0 - Today patient counseled on age appropriate routine health concerns for screening and prevention, each reviewed and up to date or declined. Immunizations reviewed and up to date or declined. Recent labs/ECG reviewed. Risk factors for depression reviewed and negative. Hearing function and visual acuity are intact. ADLs screened and addressed as needed. Functional ability and level of safety reviewed and appropriate. Education, counseling and referrals performed based on assessed risks today. Patient provided with a copy of personalized plan for preventive services.  See problem list. Medications and labs reviewed today.

## 2011-11-21 ENCOUNTER — Emergency Department (HOSPITAL_COMMUNITY)
Admission: EM | Admit: 2011-11-21 | Discharge: 2011-11-21 | Disposition: A | Discharge status: Home Health | Payer: Medicare Other | Attending: Emergency Medicine | Admitting: Emergency Medicine

## 2011-11-21 ENCOUNTER — Encounter (HOSPITAL_COMMUNITY): Payer: Self-pay | Admitting: Emergency Medicine

## 2011-11-21 DIAGNOSIS — H6090 Unspecified otitis externa, unspecified ear: Secondary | ICD-10-CM

## 2011-11-21 DIAGNOSIS — K219 Gastro-esophageal reflux disease without esophagitis: Secondary | ICD-10-CM | POA: Insufficient documentation

## 2011-11-21 DIAGNOSIS — I10 Essential (primary) hypertension: Secondary | ICD-10-CM | POA: Insufficient documentation

## 2011-11-21 DIAGNOSIS — E119 Type 2 diabetes mellitus without complications: Secondary | ICD-10-CM | POA: Insufficient documentation

## 2011-11-21 DIAGNOSIS — M109 Gout, unspecified: Secondary | ICD-10-CM | POA: Insufficient documentation

## 2011-11-21 DIAGNOSIS — G4733 Obstructive sleep apnea (adult) (pediatric): Secondary | ICD-10-CM | POA: Insufficient documentation

## 2011-11-21 DIAGNOSIS — M129 Arthropathy, unspecified: Secondary | ICD-10-CM | POA: Insufficient documentation

## 2011-11-21 DIAGNOSIS — H60399 Other infective otitis externa, unspecified ear: Secondary | ICD-10-CM | POA: Insufficient documentation

## 2011-11-21 DIAGNOSIS — Z87891 Personal history of nicotine dependence: Secondary | ICD-10-CM | POA: Insufficient documentation

## 2011-11-21 MED ORDER — CIPROFLOXACIN-DEXAMETHASONE 0.3-0.1 % OT SUSP: 4.0000 [drp] | Freq: Two times a day (BID) | OTIC | Status: AC

## 2011-11-21 NOTE — Discharge Instructions (Signed)
[name] you either have otitis externa or a fungus in your ear.  Follow up the the ENT asap.  Start the antibiotics today.  Take ibuprofen for the pain.  Otitis Externa Swimmer's ear (otitis externa) is an infection in the outer ear canal. It can be caused by a germ or a fungus. It may be caused by:  Swimming in dirty water.   Water that stays in the ear after swimming.  HOME CARE  Put drops in the ear canal as told by your doctor.   Only take medicine as told by your doctor.  GET HELP RIGHT AWAY IF:   You have a temperature by mouth above 102 F (38.9 C), not controlled by medicine.   There is ear pain after 3 days.  MAKE SURE YOU:   Understand these instructions.   Will watch this condition.   Will get help right away if you are not doing well or get worse.  Document Released: 02/01/2008 Document Revised: 08/04/2011 Document Reviewed: 02/01/2008 [facility] Patient Information 2012 [place], [place].

## 2011-11-21 NOTE — ED Notes (Signed)
Discharge instructions reviewed with pt; verbalizes understanding.  No questions asked; no further c/o's voiced.  Pt ambulatory to lobby.  NAD noted. 

## 2011-11-21 NOTE — ED Provider Notes (Signed)
History     CSN: [id number]  Arrival date & time 11/21/11  1100   First MD Initiated Contact with Patient 11/21/11 1132      Chief Complaint  Patient presents with  . Otalgia    (Consider location/radiation/quality/duration/timing/severity/associated sxs/prior treatment) Patient is a 66 y.o. male presenting with ear pain. The history is provided by the patient. No language interpreter was used.  Otalgia This is a chronic problem. There is pain in the left ear. The problem occurs constantly. There has been no fever. The pain is moderate. Associated symptoms include ear discharge and hearing loss. Pertinent negatives include no headaches, no rhinorrhea, no sore throat, no abdominal pain, no diarrhea, no vomiting, no neck pain, no cough and no rash. His past medical history is significant for chronic ear infection and hearing loss.   L ear pain x 1 month.  Went to urgent care 2 weeks ago and had his ear flushed.  No antibiotics.  Today hearing decreased and pain. pmh of the same several years ago.   Past Medical History  Diagnosis Date  . Type II or unspecified type diabetes mellitus without mention of complication, not stated as uncontrolled   . Unspecified essential hypertension   . Esophageal reflux   . Gout   . OSA (obstructive sleep apnea)     cpap started 12/2010  . Arthritis   . Umbilical hernia   . Ventral hernia     Past Surgical History  Procedure Date  . Appendectomy 04/2004  . Colon surgery   . Leg surgery   . Kidney stone surgery   . Ankle surgery   . Hernia repair 06/06/11    lap repair of incarcerated umbilical hernia with mesh    Family History  Problem Relation Age of Onset  . Coronary artery disease    . Diabetes    . Colon cancer    . Lung cancer Sister   . Cancer Sister     lung  . Cancer Brother     lung  . Cancer Sister     lung    History  Substance Use Topics  . Smoking status: Former Smoker -- 2.0 packs/day for 40 years    Types:  Cigarettes    Quit date: 08/29/1996  . Smokeless tobacco: Not on file  . Alcohol Use: Yes     2 BEERS A DAY      Review of Systems  HENT: Positive for hearing loss, ear pain and ear discharge. Negative for sore throat, rhinorrhea and neck pain.   Respiratory: Negative for cough.   Gastrointestinal: Negative for nausea, vomiting, abdominal pain and diarrhea.  Skin: Negative for rash.  Neurological: Negative for headaches.    Allergies  Review of patient's allergies indicates no known allergies.  Home Medications   Current Outpatient Rx  Name Route Sig Dispense Refill  . LOSARTAN POTASSIUM 50 MG PO TABS Oral Take 1 tablet (50 mg total) by mouth daily. 30 tablet 5  . METFORMIN HCL 500 MG PO TABS Oral Take 500 mg by mouth 2 (two) times daily with a meal.    . IBUPROFEN 200 MG PO TABS Oral Take 200 mg by mouth every 8 (eight) hours as needed. For pain      BP 160/43  Pulse 70  Temp(Src) 98.6 F (37 C) (Oral)  Resp 20  SpO2 94%  Physical Exam  Nursing note and vitals reviewed. Constitutional: He is oriented to person, place, and time. He appears well-developed  and well-nourished.  HENT:  Head: Normocephalic.  Right Ear: Tympanic membrane normal. No drainage.  Left Ear: There is tenderness. No drainage. There is mastoid tenderness.  Nose: Nose normal. Right sinus exhibits no maxillary sinus tenderness and no frontal sinus tenderness. Left sinus exhibits no maxillary sinus tenderness and no frontal sinus tenderness.  Mouth/Throat: Uvula is midline, oropharynx is clear and moist and mucous membranes are normal.       L tm not visible otitis externa? /fungal  Eyes: Conjunctivae and EOM are normal. Pupils are equal, round, and reactive to light.  Neck: Normal range of motion. Neck supple.  Cardiovascular: Normal rate.   Pulmonary/Chest: Effort normal.  Abdominal: Soft.  Musculoskeletal: Normal range of motion.  Neurological: He is alert and oriented to person, place, and  time.  Skin: Skin is warm and dry.  Psychiatric: He has a normal mood and affect.    ED Course  Procedures (including critical care time)  Labs Reviewed - No data to display No results found.   No diagnosis found.    MDM  L otitis externa.  Ciprodex drops and ENT follow up.  Unable to visualize the L TM.  Ear was not flushed.         Jethro Bastos, NP 11/22/11 1023

## 2011-11-21 NOTE — ED Notes (Signed)
Pt here for left ear pain x 2 weeks; pt sts hx of same

## 2011-11-23 NOTE — ED Provider Notes (Signed)
Medical screening examination/treatment/procedure(s) were performed by non-physician practitioner and as supervising physician I was immediately available for consultation/collaboration.  Tawsha Terrero, MD 11/23/11 1417 

## 2012-01-16 ENCOUNTER — Ambulatory Visit: Payer: Medicare Other | Admitting: Internal Medicine

## 2012-01-16 DIAGNOSIS — Z0289 Encounter for other administrative examinations: Secondary | ICD-10-CM

## 2012-03-21 ENCOUNTER — Ambulatory Visit: Payer: Medicare Other | Admitting: Pulmonary Disease

## 2012-03-27 ENCOUNTER — Ambulatory Visit: Payer: Medicare Other | Admitting: Pulmonary Disease

## 2012-05-28 ENCOUNTER — Other Ambulatory Visit (INDEPENDENT_AMBULATORY_CARE_PROVIDER_SITE_OTHER): Payer: Medicare Other

## 2012-05-28 ENCOUNTER — Ambulatory Visit (INDEPENDENT_AMBULATORY_CARE_PROVIDER_SITE_OTHER): Payer: Medicare Other | Admitting: Internal Medicine

## 2012-05-28 ENCOUNTER — Encounter: Payer: Self-pay | Admitting: Internal Medicine

## 2012-05-28 VITALS — BP 128/78 | HR 66 | Temp 97.5°F | Ht 68.0 in | Wt 260.0 lb

## 2012-05-28 DIAGNOSIS — E119 Type 2 diabetes mellitus without complications: Secondary | ICD-10-CM

## 2012-05-28 DIAGNOSIS — R609 Edema, unspecified: Secondary | ICD-10-CM

## 2012-05-28 DIAGNOSIS — G4733 Obstructive sleep apnea (adult) (pediatric): Secondary | ICD-10-CM

## 2012-05-28 DIAGNOSIS — R9439 Abnormal result of other cardiovascular function study: Secondary | ICD-10-CM

## 2012-05-28 LAB — LIPID PANEL
Cholesterol: 168 mg/dL (ref 0–200)
HDL: 29.6 mg/dL — ABNORMAL LOW (ref >39.00)
LDL Cholesterol: 99 mg/dL (ref 0–99)
VLDL: 39.6 mg/dL (ref 0.0–40.0)

## 2012-05-28 LAB — BASIC METABOLIC PANEL
CO2: 25 mEq/L (ref 19–32)
Chloride: 104 mEq/L (ref 96–112)
Creatinine, Ser: 0.8 mg/dL (ref 0.4–1.5)
Glucose, Bld: 125 mg/dL — ABNORMAL HIGH (ref 70–99)
Sodium: 137 mEq/L (ref 135–145)

## 2012-05-28 MED ORDER — FUROSEMIDE 20 MG PO TABS: 20.0000 mg | ORAL_TABLET | Freq: Every day | ORAL | Status: DC

## 2012-05-28 NOTE — Patient Instructions (Signed)
It was good to see you today. Test(s) ordered today. Your results will be released to MyChart (or called to you) after review, usually within 72hours after test completion. If any changes need to be made, you will be notified at that same time. we'll make referral for cardiac echocardiogram to evaluate your leg fluid buildup. Our office will contact you regarding appointment(s) once made. Start low-dose furosemide each a.m. for control of swelling -take in addition to other medications as reviewed Your prescription(s) have been submitted to your pharmacy. Please take as directed and contact our office if you believe you are having problem(s) with the medication(s). Continue to work on weight loss and use your CPAP machine every night as reviewed Please schedule followup in 3-6 months for diabetes and weight check, call sooner if problems.

## 2012-05-28 NOTE — Progress Notes (Signed)
  Subjective:    Patient ID: [name], male    DOB: 1945/10/24, 66 y.o.   MRN: [id number]  HPI Here for follow up - reviewed chronic medical issues:  umbilical hernia - s/p elective repair summer 2012 - doing well -   OSA - working with pulm and started CPAP for same 12/2010 - variable compliance -  DM2 - dx 2005  - on metformin since 07/2010- checks sugars 1-2x/day -the patient reports compliance with medication(s) as prescribed. Denies adverse side effects.  hypertension - on ARB since 07/2010 - denies chest pain or headache but chronic edema BLE -   EtOH abuse - reports cutting back on use > currently reports 1-2 beer/day - denies WD symptoms or prior DTs - no known liver complications or hx legal/employer issues with alcohol   gout - no swelling or recent trauma/injury; +hx prior flares - improved with self-tx cherry juice    Past Medical History  Diagnosis Date  . Type II or unspecified type diabetes mellitus without mention of complication, not stated as uncontrolled   . Unspecified essential hypertension   . Esophageal reflux   . Gout   . OSA (obstructive sleep apnea)     cpap started 12/2010  . Arthritis   . Umbilical hernia   . Ventral hernia     Review of Systems  Constitutional: Negative for fever and unexpected weight change.  Respiratory: Negative for shortness of breath and wheezing.   Cardiovascular: Positive for leg swelling. Negative for chest pain.       Objective:   Physical Exam  BP 128/78  Pulse 66  Temp 97.5 F (36.4 C) (Oral)  Ht 5\' 8"  (1.727 m)  Wt 260 lb (117.935 kg)  BMI 39.53 kg/m2  SpO2 96% Wt Readings from Last 3 Encounters:  05/28/12 260 lb (117.935 kg)  09/26/11 267 lb (121.11 kg)  09/22/11 273 lb (123.832 kg)   Constitutional:  Obese; appears well-developed and well-nourished. No distress. son at side - generalized poor hygeine Neck: Normal range of motion. Thick. Neck supple. No JVD present. No thyromegaly present.    Cardiovascular: Normal rate, regular rhythm and normal heart sounds.  No murmur heard. 1+ pitting BLE edema to below knee Pulmonary/Chest: Effort normal and breath sounds normal. No respiratory distress. no wheezes.  Abdominal: Soft. Bowel sounds are normal. Patient exhibits no distension. There is no tenderness. resolved umbilical hernia, no recurrence Psychiatric: he has a normal mood and affect. behavior is normal. Judgment and thought content normal.   Lab Results  Component Value Date   WBC 6.9 06/02/2011   HGB 13.2 06/02/2011   HCT 39.3 06/02/2011   PLT 142* 06/02/2011   CHOL 173 05/24/2011   TRIG 134.0 05/24/2011   HDL 43.10 05/24/2011   ALT 19 07/24/2010   AST 30 07/24/2010   NA 136 06/02/2011   K 4.6 06/02/2011   CL 101 06/02/2011   CREATININE 0.77 06/02/2011   BUN 14 06/02/2011   CO2 29 06/02/2011   INR 1.05 06/02/2011   HGBA1C 7.4* 09/26/2011       Assessment & Plan:   See problem list. Medications and labs reviewed today.

## 2012-05-28 NOTE — Assessment & Plan Note (Signed)
started on CPAP summer 2012, variable compliance reviewed - follows with pulm for same Continue to work on weight loss

## 2012-05-28 NOTE — Assessment & Plan Note (Signed)
On metformin for same since 07/2010- also ARB Check a1c now and adjust prn  Consider need for low dose statin Lab Results  Component Value Date   HGBA1C 7.4* 09/26/2011

## 2012-05-28 NOTE — Assessment & Plan Note (Signed)
Chronic BLE symptoms, worse in past few months Reviewed noncompliance with CPAP - ?pulm HTN Also risk for CAD/CHF - check stress echo now Add low dose daily lasix and check lytes now

## 2012-05-29 ENCOUNTER — Encounter: Payer: Self-pay | Admitting: Internal Medicine

## 2012-05-31 ENCOUNTER — Other Ambulatory Visit (HOSPITAL_COMMUNITY): Payer: Medicare Other

## 2012-06-07 ENCOUNTER — Telehealth: Payer: Self-pay | Admitting: *Deleted

## 2012-06-07 ENCOUNTER — Other Ambulatory Visit (HOSPITAL_COMMUNITY): Payer: Self-pay | Admitting: Internal Medicine

## 2012-06-07 DIAGNOSIS — R609 Edema, unspecified: Secondary | ICD-10-CM

## 2012-06-07 NOTE — Telephone Encounter (Signed)
Stress echo - thanks

## 2012-06-07 NOTE — Telephone Encounter (Signed)
Sherri at Cardiology informed.

## 2012-06-07 NOTE — Telephone Encounter (Signed)
Cardiology called regarding procedure for tomorrow. They want to know if you want an echo, a stress echo, or a dobutamine echo.

## 2012-06-08 ENCOUNTER — Ambulatory Visit (HOSPITAL_COMMUNITY): Payer: Medicare Other | Attending: Cardiovascular Disease

## 2012-06-08 ENCOUNTER — Encounter: Payer: Self-pay | Admitting: Internal Medicine

## 2012-06-08 ENCOUNTER — Other Ambulatory Visit (HOSPITAL_COMMUNITY): Payer: Medicare Other

## 2012-06-08 ENCOUNTER — Other Ambulatory Visit (HOSPITAL_COMMUNITY): Payer: Self-pay | Admitting: *Deleted

## 2012-06-08 ENCOUNTER — Other Ambulatory Visit (HOSPITAL_COMMUNITY): Payer: Self-pay | Admitting: Internal Medicine

## 2012-06-08 DIAGNOSIS — E119 Type 2 diabetes mellitus without complications: Secondary | ICD-10-CM | POA: Insufficient documentation

## 2012-06-08 DIAGNOSIS — R0609 Other forms of dyspnea: Secondary | ICD-10-CM | POA: Insufficient documentation

## 2012-06-08 DIAGNOSIS — R072 Precordial pain: Secondary | ICD-10-CM

## 2012-06-08 DIAGNOSIS — R079 Chest pain, unspecified: Secondary | ICD-10-CM

## 2012-06-08 DIAGNOSIS — I2589 Other forms of chronic ischemic heart disease: Secondary | ICD-10-CM | POA: Insufficient documentation

## 2012-06-08 DIAGNOSIS — R0989 Other specified symptoms and signs involving the circulatory and respiratory systems: Secondary | ICD-10-CM | POA: Insufficient documentation

## 2012-06-08 DIAGNOSIS — G4733 Obstructive sleep apnea (adult) (pediatric): Secondary | ICD-10-CM | POA: Insufficient documentation

## 2012-06-08 DIAGNOSIS — R609 Edema, unspecified: Secondary | ICD-10-CM

## 2012-06-08 HISTORY — PX: DOBUTAMINE STRESS ECHO: SHX5426

## 2012-06-08 MED ORDER — SODIUM CHLORIDE 0.9 % IV SOLN
30.0000 ug/kg | Freq: Once | INTRAVENOUS | Status: AC
  Administered 2012-06-08: 30 ug/kg/min via INTRAVENOUS

## 2012-06-08 NOTE — Progress Notes (Signed)
Echocardiogram performed.  

## 2012-06-10 ENCOUNTER — Other Ambulatory Visit: Payer: Self-pay | Admitting: Internal Medicine

## 2012-06-10 DIAGNOSIS — R9439 Abnormal result of other cardiovascular function study: Secondary | ICD-10-CM | POA: Insufficient documentation

## 2012-06-10 MED ORDER — ASPIRIN EC 81 MG PO TBEC: 81.0000 mg | DELAYED_RELEASE_TABLET | Freq: Every day | ORAL | Status: DC

## 2012-06-10 NOTE — Addendum Note (Signed)
Addended by: Rene Paci A on: 06/10/2012 07:53 PM   Modules accepted: Orders

## 2012-06-11 ENCOUNTER — Telehealth: Payer: Self-pay

## 2012-06-11 ENCOUNTER — Encounter: Payer: Self-pay | Admitting: Internal Medicine

## 2012-06-11 MED ORDER — LOSARTAN POTASSIUM 50 MG PO TABS: 50.0000 mg | ORAL_TABLET | Freq: Every day | ORAL | Status: DC

## 2012-06-11 NOTE — Telephone Encounter (Signed)
See result note for ECHO

## 2012-06-11 NOTE — Telephone Encounter (Signed)
Pt's spouse called requesting results of ECHO 

## 2012-06-22 ENCOUNTER — Encounter: Payer: Self-pay | Admitting: *Deleted

## 2012-06-22 ENCOUNTER — Encounter: Payer: Self-pay | Admitting: Cardiovascular Disease

## 2012-06-22 ENCOUNTER — Ambulatory Visit (INDEPENDENT_AMBULATORY_CARE_PROVIDER_SITE_OTHER): Payer: Medicare Other | Admitting: Cardiovascular Disease

## 2012-06-22 ENCOUNTER — Other Ambulatory Visit: Payer: Self-pay | Admitting: Cardiovascular Disease

## 2012-06-22 VITALS — BP 144/80 | HR 67 | Ht 68.0 in | Wt 263.4 lb

## 2012-06-22 DIAGNOSIS — Z01818 Encounter for other preprocedural examination: Secondary | ICD-10-CM

## 2012-06-22 DIAGNOSIS — Z01810 Encounter for preprocedural cardiovascular examination: Secondary | ICD-10-CM

## 2012-06-22 DIAGNOSIS — I10 Essential (primary) hypertension: Secondary | ICD-10-CM

## 2012-06-22 DIAGNOSIS — R9439 Abnormal result of other cardiovascular function study: Secondary | ICD-10-CM

## 2012-06-22 DIAGNOSIS — R609 Edema, unspecified: Secondary | ICD-10-CM

## 2012-06-22 DIAGNOSIS — E119 Type 2 diabetes mellitus without complications: Secondary | ICD-10-CM

## 2012-06-22 DIAGNOSIS — R238 Other skin changes: Secondary | ICD-10-CM

## 2012-06-22 DIAGNOSIS — G4733 Obstructive sleep apnea (adult) (pediatric): Secondary | ICD-10-CM

## 2012-06-22 DIAGNOSIS — R233 Spontaneous ecchymoses: Secondary | ICD-10-CM

## 2012-06-22 LAB — CBC WITH DIFFERENTIAL/PLATELET
Basophils Absolute: 0 10*3/uL (ref 0.0–0.1)
Eosinophils Absolute: 0.1 10*3/uL (ref 0.0–0.7)
Lymphocytes Relative: 20.3 % (ref 12.0–46.0)
MCHC: 32.7 g/dL (ref 30.0–36.0)
MCV: 88 fl (ref 78.0–100.0)
Monocytes Absolute: 0.5 10*3/uL (ref 0.1–1.0)
Neutro Abs: 3.6 10*3/uL (ref 1.4–7.7)
Neutrophils Relative %: 68.4 % (ref 43.0–77.0)
RDW: 15.3 % — ABNORMAL HIGH (ref 11.5–14.6)

## 2012-06-22 LAB — PROTIME-INR: INR: 1.1 ratio — ABNORMAL HIGH (ref 0.8–1.0)

## 2012-06-22 LAB — BASIC METABOLIC PANEL
CO2: 28 mEq/L (ref 19–32)
Calcium: 8.7 mg/dL (ref 8.4–10.5)
Creatinine, Ser: 0.8 mg/dL (ref 0.4–1.5)
Glucose, Bld: 121 mg/dL — ABNORMAL HIGH (ref 70–99)

## 2012-06-22 NOTE — Patient Instructions (Addendum)
Your physician has requested that you have a cardiac catheterization. Cardiac catheterization is used to diagnose and/or treat various heart conditions. Doctors may recommend this procedure for a number of different reasons. The most common reason is to evaluate chest pain. Chest pain can be a symptom of coronary artery disease (CAD), and cardiac catheterization can show whether plaque is narrowing or blocking your heart's arteries. This procedure is also used to evaluate the valves, as well as measure the blood flow and oxygen levels in different parts of your heart.  Please follow instruction sheet, as given.     

## 2012-06-22 NOTE — Progress Notes (Signed)
[name] Date of Birth  1945/11/13       [facility]    [place] [address]  [name], [address] [place], [place]  [address]   [place], [place]  [address] [phone]     [phone]   Fax  [phone]    Fax [phone]  Problem List: 1. Chest tightness 2. indigestion  History of Present Illness:  [name] is a 66 yo with hx of indigestion / chest pain  that develops with walking.  The pain is a "ache" and lasts as long as he is walking.   It causes him some dyspnea.  It typically goes away when he stops.  He has lots of belching.  Hx of asthma. Hx of smoking in the past.  No regular exercise.  He is now retired.  He gets a little exercise every day.  He had a dobutamine echo that revealed mild ischemia in the inferior wall.    He admits to eating salty foods as well as fried foods.  Current Outpatient Prescriptions on File Prior to Visit  Medication Sig Dispense Refill  . furosemide (LASIX) 20 MG tablet Take 1 tablet (20 mg total) by mouth daily.  30 tablet  3  . losartan (COZAAR) 50 MG tablet Take 1 tablet (50 mg total) by mouth daily.  30 tablet  5  . metFORMIN (GLUCOPHAGE) 500 MG tablet Take 500 mg by mouth 2 (two) times daily with a meal.        No Known Allergies  Past Medical History  Diagnosis Date  . Type II or unspecified type diabetes mellitus without mention of complication, not stated as uncontrolled   . Unspecified essential hypertension   . Esophageal reflux   . Gout   . OSA (obstructive sleep apnea)     cpap started 12/2010  . Arthritis   . Umbilical hernia   . Ventral hernia     Past Surgical History  Procedure Date  . Appendectomy 04/2004  . Colon surgery   . Leg surgery   . Kidney stone surgery   . Ankle surgery   . Hernia repair 06/06/11    lap repair of incarcerated umbilical hernia with mesh    History  Smoking status  . Former Smoker -- 2.0 packs/day for 40 years  . Types: Cigarettes  .  Quit date: 08/29/1996  Smokeless tobacco  . Not on file    History  Alcohol Use  . Yes    2 BEERS A DAY    Family History  Problem Relation Age of Onset  . Coronary artery disease    . Diabetes    . Colon cancer    . Lung cancer Sister   . Cancer Sister     lung  . Cancer Brother     lung  . Cancer Sister     lung    Reviw of Systems:  Reviewed in the HPI.  All other systems are negative.  Physical Exam: Blood pressure 144/80, pulse 67, height 5\' 8"  (1.727 m), weight 263 lb 6.4 oz (119.477 kg), SpO2 96.00%. General: Well developed, well nourished, in no acute distress.  Head: Normocephalic, atraumatic, sclera non-icteric, mucus membranes are moist,   Neck: Supple. Carotids are 2 + without bruits. No JVD  Lungs: Clear bilaterally to auscultation.  Few rhonchi with cough  Heart: regular rate.  normal  S1 S2. No murmurs, gallops or rubs.  Abdomen: Soft, non-tender, obese.  Exogenous obesity - difficult to access femoral arteries.  Msk:  Strength and tone are normal  Extremities: No clubbing or cyanosis. 1-2 +  edema.  Distal pedal pulses are 2+ and equal bilaterally.  Neuro: Alert and oriented X 3. Moves all extremities spontaneously.  Psych:  Responds to questions appropriately with a normal affect.  ECG: Oct. 25, 2013 - NSR at 71, no ST or T wave changes  Assessment / Plan:

## 2012-06-22 NOTE — Assessment & Plan Note (Signed)
[name]  presents with exertional indigestion-like chest pain. If it is described as a chest ache. He had a dobutamine echo which revealed possible inferior wall ischemia.  He presents today for followup.  Given his symptoms and his abnormal stress test and overall risks I think that we should proceed with cardiac catheterization. We have discussed the risks, benefits, and options of cardiac catheterization. He understands and agrees to proceed.  We will schedule it to be done next Thursday, the 31st. We'll plan on using the right radial approach. He is quite obese and accessing his femoral arteries would be challenging.

## 2012-06-28 ENCOUNTER — Inpatient Hospital Stay (HOSPITAL_BASED_OUTPATIENT_CLINIC_OR_DEPARTMENT_OTHER)
Admission: RE | Admit: 2012-06-28 | Discharge: 2012-06-28 | Disposition: A | Discharge status: Hospice | Payer: Medicare Other | Source: Ambulatory Visit | Attending: Cardiovascular Disease | Admitting: Cardiovascular Disease

## 2012-06-28 ENCOUNTER — Encounter (HOSPITAL_COMMUNITY): Payer: Self-pay | Admitting: General Practice

## 2012-06-28 ENCOUNTER — Ambulatory Visit (HOSPITAL_COMMUNITY)
Admission: AD | Admit: 2012-06-28 | Discharge: 2012-06-29 | Disposition: A | Discharge status: Home / Self Care | Payer: Medicare Other | Source: Ambulatory Visit | Attending: Cardiovascular Disease | Admitting: Cardiovascular Disease

## 2012-06-28 ENCOUNTER — Encounter (HOSPITAL_COMMUNITY)
Admission: AD | Disposition: A | Discharge status: Home / Self Care | Payer: Self-pay | Source: Ambulatory Visit | Attending: Cardiovascular Disease

## 2012-06-28 ENCOUNTER — Other Ambulatory Visit: Payer: Self-pay | Admitting: *Deleted

## 2012-06-28 ENCOUNTER — Encounter (HOSPITAL_BASED_OUTPATIENT_CLINIC_OR_DEPARTMENT_OTHER)
Admission: RE | Disposition: A | Discharge status: Hospice | Payer: Self-pay | Source: Ambulatory Visit | Attending: Cardiovascular Disease

## 2012-06-28 DIAGNOSIS — I10 Essential (primary) hypertension: Secondary | ICD-10-CM | POA: Insufficient documentation

## 2012-06-28 DIAGNOSIS — K219 Gastro-esophageal reflux disease without esophagitis: Secondary | ICD-10-CM | POA: Insufficient documentation

## 2012-06-28 DIAGNOSIS — I200 Unstable angina: Secondary | ICD-10-CM | POA: Insufficient documentation

## 2012-06-28 DIAGNOSIS — I2510 Atherosclerotic heart disease of native coronary artery without angina pectoris: Secondary | ICD-10-CM | POA: Insufficient documentation

## 2012-06-28 DIAGNOSIS — I119 Hypertensive heart disease without heart failure: Secondary | ICD-10-CM | POA: Diagnosis present

## 2012-06-28 DIAGNOSIS — Z955 Presence of coronary angioplasty implant and graft: Secondary | ICD-10-CM

## 2012-06-28 DIAGNOSIS — D696 Thrombocytopenia, unspecified: Secondary | ICD-10-CM | POA: Diagnosis present

## 2012-06-28 DIAGNOSIS — E119 Type 2 diabetes mellitus without complications: Secondary | ICD-10-CM | POA: Insufficient documentation

## 2012-06-28 DIAGNOSIS — E118 Type 2 diabetes mellitus with unspecified complications: Secondary | ICD-10-CM | POA: Diagnosis present

## 2012-06-28 DIAGNOSIS — G4733 Obstructive sleep apnea (adult) (pediatric): Secondary | ICD-10-CM | POA: Insufficient documentation

## 2012-06-28 DIAGNOSIS — R9439 Abnormal result of other cardiovascular function study: Secondary | ICD-10-CM | POA: Diagnosis present

## 2012-06-28 DIAGNOSIS — E785 Hyperlipidemia, unspecified: Secondary | ICD-10-CM

## 2012-06-28 HISTORY — PX: PERCUTANEOUS CORONARY STENT INTERVENTION (PCI-S): SHX5485

## 2012-06-28 HISTORY — DX: Atherosclerotic heart disease of native coronary artery without angina pectoris: I25.10

## 2012-06-28 LAB — POCT I-STAT GLUCOSE
Glucose, Bld: 144 mg/dL — ABNORMAL HIGH (ref 70–99)
Operator id: 133881

## 2012-06-28 LAB — GLUCOSE, CAPILLARY: Glucose-Capillary: 109 mg/dL — ABNORMAL HIGH (ref 70–99)

## 2012-06-28 SURGERY — JV LEFT HEART CATHETERIZATION WITH CORONARY ANGIOGRAM
Anesthesia: Moderate Sedation

## 2012-06-28 SURGERY — PERCUTANEOUS CORONARY STENT INTERVENTION (PCI-S)
Anesthesia: LOCAL

## 2012-06-28 MED ORDER — LIDOCAINE HCL (PF) 1 % IJ SOLN
INTRAMUSCULAR | Status: AC
  Filled 2012-06-28: qty 30

## 2012-06-28 MED ORDER — BIVALIRUDIN 250 MG IV SOLR
INTRAVENOUS | Status: AC
  Filled 2012-06-28: qty 250

## 2012-06-28 MED ORDER — NITROGLYCERIN 0.2 MG/ML ON CALL CATH LAB
INTRAVENOUS | Status: AC
  Filled 2012-06-28: qty 1

## 2012-06-28 MED ORDER — PRASUGREL HCL 10 MG PO TABS
60.0000 mg | ORAL_TABLET | Freq: Once | ORAL | Status: AC
  Administered 2012-06-28: 60 mg via ORAL
  Filled 2012-06-28: qty 6

## 2012-06-28 MED ORDER — ATORVASTATIN CALCIUM 80 MG PO TABS
80.0000 mg | ORAL_TABLET | Freq: Every day | ORAL | Status: DC
  Administered 2012-06-28: 18:00:00 80 mg via ORAL
  Filled 2012-06-28 (×2): qty 1

## 2012-06-28 MED ORDER — HEPARIN (PORCINE) IN NACL 2-0.9 UNIT/ML-% IJ SOLN
INTRAMUSCULAR | Status: AC
  Filled 2012-06-28: qty 1500

## 2012-06-28 MED ORDER — ACETAMINOPHEN 325 MG PO TABS: 650.0000 mg | ORAL_TABLET | ORAL | Status: DC | PRN

## 2012-06-28 MED ORDER — SODIUM CHLORIDE 0.9 % IV SOLN
INTRAVENOUS | Status: AC
  Administered 2012-06-28: 15:00:00 via INTRAVENOUS

## 2012-06-28 MED ORDER — FUROSEMIDE 20 MG PO TABS
20.0000 mg | ORAL_TABLET | Freq: Every day | ORAL | Status: DC
  Administered 2012-06-28 – 2012-06-29 (×2): 20 mg via ORAL
  Filled 2012-06-28 (×2): qty 1

## 2012-06-28 MED ORDER — PRASUGREL HCL 10 MG PO TABS
10.0000 mg | ORAL_TABLET | Freq: Every day | ORAL | Status: DC
  Administered 2012-06-29: 10:00:00 10 mg via ORAL
  Filled 2012-06-28 (×2): qty 1

## 2012-06-28 MED ORDER — ONDANSETRON HCL 4 MG/2ML IJ SOLN: 4.0000 mg | Freq: Four times a day (QID) | INTRAMUSCULAR | Status: DC | PRN

## 2012-06-28 MED ORDER — LOSARTAN POTASSIUM 50 MG PO TABS
50.0000 mg | ORAL_TABLET | Freq: Every day | ORAL | Status: DC
  Administered 2012-06-29: 10:00:00 50 mg via ORAL
  Filled 2012-06-28: qty 1

## 2012-06-28 MED ORDER — MIDAZOLAM HCL 2 MG/2ML IJ SOLN
INTRAMUSCULAR | Status: AC
  Filled 2012-06-28: qty 2

## 2012-06-28 MED ORDER — METFORMIN HCL 500 MG PO TABS: 500.0000 mg | ORAL_TABLET | Freq: Two times a day (BID) | ORAL | Status: DC

## 2012-06-28 MED ORDER — ASPIRIN 81 MG PO CHEW
81.0000 mg | CHEWABLE_TABLET | Freq: Every day | ORAL | Status: DC
  Administered 2012-06-29: 81 mg via ORAL
  Filled 2012-06-28: qty 1

## 2012-06-28 MED ORDER — MAGNESIUM HYDROXIDE 400 MG/5ML PO SUSP
30.0000 mL | Freq: Every day | ORAL | Status: DC | PRN
  Administered 2012-06-28: 22:00:00 30 mL via ORAL
  Filled 2012-06-28: qty 30

## 2012-06-28 MED ORDER — FENTANYL CITRATE 0.05 MG/ML IJ SOLN
INTRAMUSCULAR | Status: AC
  Filled 2012-06-28: qty 2

## 2012-06-28 MED ORDER — VERAPAMIL HCL 2.5 MG/ML IV SOLN
INTRAVENOUS | Status: AC
  Filled 2012-06-28: qty 2

## 2012-06-28 NOTE — Progress Notes (Signed)
TR BAND REMOVAL  LOCATION:  right radial  DEFLATED PER PROTOCOL:  yes  TIME BAND OFF / DRESSING APPLIED: 1800     SITE UPON ARRIVAL:   Level 0  SITE AFTER BAND REMOVAL:  Level 0  REVERSE ALLEN'S TEST:    positive  CIRCULATION SENSATION AND MOVEMENT:  Within Normal Limits  yes  COMMENTS:     

## 2012-06-28 NOTE — Interval H&P Note (Signed)
History and Physical Interval Note:  06/28/2012 9:38 AM  [name]  has presented today for surgery, with the diagnosis of Abnormal Stress Echo  The various methods of treatment have been discussed with the patient and family. After consideration of risks, benefits and other options for treatment, the patient has consented to  Procedure(s) (LRB) with comments: JV LEFT HEART CATHETERIZATION WITH CORONARY ANGIOGRAM (N/A) as a surgical intervention .  The patient's history has been reviewed, patient examined, no change in status, stable for surgery.  I have reviewed the patient's chart and labs.  Questions were answered to the patient's satisfaction.     Elyn Aquas.

## 2012-06-28 NOTE — H&P (Signed)
Margarita Sermons  Date of Birth 1946/02/06  [facility] [place]  [address]  [place], [place] [address] [place], [place] [address]  [phone]  Fax [phone] Fax [phone]  Problem List:  1. Chest tightness  2. indigestion  History of Present Illness:  Shoaib is a 66 yo with hx of indigestion / chest pain that develops with walking. The pain is a "ache" and lasts as long as he is walking. It causes him some dyspnea. It typically goes away when he stops. He has lots of belching. Hx of asthma. Hx of smoking in the past. No regular exercise. He is now retired. He gets a little exercise every day.  He had a dobutamine echo that revealed mild ischemia in the inferior wall.  He admits to eating salty foods as well as fried foods.  Current Outpatient Prescriptions on File Prior to Visit   Medication  Sig  Dispense  Refill   .  furosemide (LASIX) 20 MG tablet  Take 1 tablet (20 mg total) by mouth daily.  30 tablet  3   .  losartan (COZAAR) 50 MG tablet  Take 1 tablet (50 mg total) by mouth daily.  30 tablet  5   .  metFORMIN (GLUCOPHAGE) 500 MG tablet  Take 500 mg by mouth 2 (two) times daily with a meal.      No Known Allergies  Past Medical History   Diagnosis  Date   .  Type II or unspecified type diabetes mellitus without mention of complication, not stated as uncontrolled    .  Unspecified essential hypertension    .  Esophageal reflux    .  Gout    .  OSA (obstructive sleep apnea)      cpap started 12/2010   .  Arthritis    .  Umbilical hernia    .  Ventral hernia     Past Surgical History   Procedure  Date   .  Appendectomy  04/2004   .  Colon surgery    .  Leg surgery    .  Kidney stone surgery    .  Ankle surgery    .  Hernia repair  06/06/11     lap repair of incarcerated umbilical hernia with mesh    History   Smoking status   .  Former Smoker -- 2.0 packs/day for 40 years   .  Types:   Cigarettes   .  Quit date:  08/29/1996   Smokeless tobacco   .  Not on file    History   Alcohol Use   .  Yes     2 BEERS A DAY    Family History   Problem  Relation  Age of Onset   .  Coronary artery disease     .  Diabetes     .  Colon cancer     .  Lung cancer  Sister    .  Cancer  Sister       lung    .  Cancer  Brother       lung    .  Cancer  Sister       lung    Reviw of Systems:  Reviewed in the HPI. All other systems are negative.  Physical Exam:  Blood pressure 144/80, pulse 67, height 5\' 8"  (1.727 m), weight 263 lb 6.4 oz (119.477 kg), SpO2 96.00%.  General: Well developed, well nourished, in no acute distress.  Head: Normocephalic, atraumatic, sclera non-icteric, mucus membranes are moist,  Neck: Supple. Carotids are 2 + without bruits. No JVD  Lungs: Clear bilaterally to auscultation. Few rhonchi with cough  Heart: regular rate. normal S1 S2. No murmurs, gallops or rubs.  Abdomen: Soft, non-tender, obese. Exogenous obesity - difficult to access femoral arteries.  Msk: Strength and tone are normal  Extremities: No clubbing or cyanosis. 1-2 + edema. Distal pedal pulses are 2+ and equal bilaterally.  Neuro: Alert and oriented X 3. Moves all extremities spontaneously.  Psych: Responds to questions appropriately with a normal affect.  ECG:  Oct. 25, 2013 - NSR at 20, no ST or T wave changes  Assessment / Plan:   Abnormal stress echo - Elyn Aquas., MD 06/22/2012 12:34 PM Signed  Brett Canales presents with exertional indigestion-like chest pain. If it is described as a chest ache. He had a dobutamine echo which revealed possible inferior wall ischemia.  He presents today for followup.  Given his symptoms and his abnormal stress test and overall risks I think that we should proceed with cardiac catheterization. We have discussed the risks, benefits, and options of cardiac catheterization. He understands and agrees to proceed. We will schedule it to be done next  Thursday, the 31st. We'll plan on using the right radial approach. He is quite obese and accessing his femoral arteries would be challenging.    Vesta Mixer, Montez Hageman., MD, [facility] 06/28/2012, 9:38 AM Office - [phone] Pager [phone]

## 2012-06-28 NOTE — Interval H&P Note (Signed)
History and Physical Interval Note:  06/28/2012 12:05 PM  [name]  has presented today for surgery, with the diagnosis of blockage  The various methods of treatment have been discussed with the patient and family. After consideration of risks, benefits and other options for treatment, the patient has consented to  Procedure(s) (LRB) with comments: PERCUTANEOUS CORONARY STENT INTERVENTION (PCI-S) (N/A) as a surgical intervention .  The patient's history has been reviewed, patient examined, no change in status, stable for surgery.  I have reviewed the patient's chart and labs.  Questions were answered to the patient's satisfaction.    Films reviewed. Discussed findings with Dr. Elease Hashimoto and the patient. Plans for PCI of the LAD, which is clearly this patient's culprit lesion. He has been loaded with effient 60 mg. I have confirmed that he has no history of stroke or TIA, bleeding diathesis, and no plans for upcoming surgery in the next 12 months.     Tonny Bollman

## 2012-06-28 NOTE — CV Procedure (Signed)
    Cardiac Cath Note  BLU LORI [id number] 05-Oct-1945  Procedure: left  Heart Cardiac Catheterization  Indications: unstable angina  Procedure Details Consent: Obtained Time Out: Verified patient identification, verified procedure, site/side was marked, verified correct patient position, special equipment/implants available, Radiology Safety Procedures followed,  medications/allergies/relevent history reviewed, required imaging and test results available.  Performed   Medications: Fentanyl: 50 mcg IV Versed: 2 mg IV  The right femoral artery was easily canulated using a modified Seldinger technique.  Hemodynamics:   LV pressure: 160/25 Aortic pressure: 158/78  Angiography   Left Main: There is a 40 % ostial lesion and a 50-60% distal LM stenosis.  Left anterior Descending: moderately calcified.  There is a 99% stenosis just after the 1st septal.  This clearly is a ruptured plaque. There are several small diagonals.  There is evidence of collateral filling of the mid and distal LAD from the right injections  Left Circumflex: large vessel.  There is a 50% stenosis proximally and a 50 mid LCx stenosis.  There is a vessel that fills late via collaterals that is in the distribution of a high 1st OM or ramus intermediate vessel.  I cannot tell where this vessel originates.  Right Coronary Artery: moderate - large in size.  There is an eccentric 30-40% stenosis in the proximal vessel and a long 50% stenosis in the mid vessel.    The PDA and PLSA are unremarkable.  LV Gram:  Catheter induced ectopy.  Normal LV function with EF of 65%-70%.  Complications: No apparent complications Patient did tolerate procedure well.  Contrast used:   95cc  Conclusions:  1. Significant diffuse CAD with a tight mid LAD stenosis.  This clearly is the culprit lesion.  He has diffuse stenosis elsewhere.  I asked the patient about considering CABG but he does not want to have surgery.  I have  reviewed the angiograms with Dr. Excell Seltzer who is willing to stent the mid LAD. He has diabetes.  He presented with unstable angina symptoms  2. Well preserved LV function with EF of 65-70%.  Vesta Mixer, Montez Hageman., MD, [facility] 06/28/2012, 10:35 AM Office - [phone] Pager [phone]

## 2012-06-28 NOTE — CV Procedure (Signed)
   CARDIAC CATH NOTE  Name: [name] MRN: [id number] DOB: 05/21/1946  Procedure: PTCA and stenting of the proximal LAD  Indication: Unstable angina. [name] underwent diagnostic catheterization in the outpatient cath lab today. He has diabetes and presented with progressive symptoms of angina. His nuclear scan was positive for ischemia. He was found to have an ulcerated plaque in the proximal LAD with critical stenosis. He also had mild to moderate left main and left circumflex stenoses. None of his other residual disease was critical. After careful review and discussion of various treatment options we elected to proceed with PCI of the LAD. He was loaded with effient 60 mg.  Procedural Details: The right wrist was prepped, draped, and anesthetized with 1% lidocaine. There was an indwelling 5 French sheath and this was changed out for a 6 [place] sheath using sterile technique. 3 mg verapamil was administered through the radial sheath. Weight-based bivalirudin was given for anticoagulation. Once a therapeutic ACT was achieved, a 6 [place] XB LAD guide catheter was inserted.  A BMW coronary guidewire was used to cross the lesion.  The lesion was predilated with a 2.5 x 20 mm balloon.  The lesion was then stented with a 3.0 x 28 mm drug-eluting stent.  The stent was postdilated with a 3.25 mm noncompliant balloon.  Following PCI, there was 0% residual stenosis and TIMI-3 flow. Final angiography confirmed an excellent result. The patient tolerated the procedure well. There were no immediate procedural complications. A TR band was used for radial hemostasis. The patient was transferred to the post catheterization recovery area for further monitoring.  Lesion Data: Vessel: LAD/proximal Percent stenosis (pre): 95 TIMI-flow (pre):  3 Stent:  3.0 x 28 mm DES Percent stenosis (post): 0 TIMI-flow (post): 3  Conclusions: Successful PCI to proximal LAD utilizing a single drug-eluting  stent  Recommendations: Aspirin and effient for a minimum of 12 months. Outpatient followup with Dr. Elease Hashimoto.  Tonny Bollman 06/28/2012, 1:00 PM

## 2012-06-28 NOTE — OR Nursing (Signed)
Transported to main cath lab via stretcher on monitor 

## 2012-06-28 NOTE — H&P (View-Only) (Signed)
[name]  Date of Birth 03/21/1946  [place] [facility] [place] [facility]  [address]  [place], [place] [address] [place], [place] [address]  [phone]  Fax [phone] Fax [phone]  Problem List:  1. Chest tightness  2. indigestion  History of Present Illness:  [name] is a 66 yo with hx of indigestion / chest pain that develops with walking. The pain is a "ache" and lasts as long as he is walking. It causes him some dyspnea. It typically goes away when he stops. He has lots of belching. Hx of asthma. Hx of smoking in the past. No regular exercise. He is now retired. He gets a little exercise every day.  He had a dobutamine echo that revealed mild ischemia in the inferior wall.  He admits to eating salty foods as well as fried foods.  Current Outpatient Prescriptions on File Prior to Visit   Medication  Sig  Dispense  Refill   .  furosemide (LASIX) 20 MG tablet  Take 1 tablet (20 mg total) by mouth daily.  30 tablet  3   .  losartan (COZAAR) 50 MG tablet  Take 1 tablet (50 mg total) by mouth daily.  30 tablet  5   .  metFORMIN (GLUCOPHAGE) 500 MG tablet  Take 500 mg by mouth 2 (two) times daily with a meal.      No Known Allergies  Past Medical History   Diagnosis  Date   .  Type II or unspecified type diabetes mellitus without mention of complication, not stated as uncontrolled    .  Unspecified essential hypertension    .  Esophageal reflux    .  Gout    .  OSA (obstructive sleep apnea)      cpap started 12/2010   .  Arthritis    .  Umbilical hernia    .  Ventral hernia     Past Surgical History   Procedure  Date   .  Appendectomy  04/2004   .  Colon surgery    .  Leg surgery    .  Kidney stone surgery    .  Ankle surgery    .  Hernia repair  06/06/11     lap repair of incarcerated umbilical hernia with mesh    History   Smoking status   .  Former Smoker -- 2.0 packs/day for 40 years   .  Types:   Cigarettes   .  Quit date:  08/29/1996   Smokeless tobacco   .  Not on file    History   Alcohol Use   .  Yes     2 BEERS A DAY    Family History   Problem  Relation  Age of Onset   .  Coronary artery disease     .  Diabetes     .  Colon cancer     .  Lung cancer  Sister    .  Cancer  Sister       lung    .  Cancer  Brother       lung    .  Cancer  Sister       lung    Reviw of Systems:  Reviewed in the HPI. All other systems are negative.  Physical Exam:  Blood pressure 144/80, pulse 67, height 5' 8" (1.727 m), weight 263 lb 6.4 oz (119.477 kg), SpO2 96.00%.    General: Well developed, well nourished, in no acute distress.  Head: Normocephalic, atraumatic, sclera non-icteric, mucus membranes are moist,  Neck: Supple. Carotids are 2 + without bruits. No JVD  Lungs: Clear bilaterally to auscultation. Few rhonchi with cough  Heart: regular rate. normal S1 S2. No murmurs, gallops or rubs.  Abdomen: Soft, non-tender, obese. Exogenous obesity - difficult to access femoral arteries.  Msk: Strength and tone are normal  Extremities: No clubbing or cyanosis. 1-2 + edema. Distal pedal pulses are 2+ and equal bilaterally.  Neuro: Alert and oriented X 3. Moves all extremities spontaneously.  Psych: Responds to questions appropriately with a normal affect.  ECG:  Oct. 25, 2013 - NSR at 71, no ST or T wave changes  Assessment / Plan:   Abnormal stress echo - Philip Nahser Jr., MD 06/22/2012 12:34 PM Signed  [name] presents with exertional indigestion-like chest pain. If it is described as a chest ache. He had a dobutamine echo which revealed possible inferior wall ischemia.  He presents today for followup.  Given his symptoms and his abnormal stress test and overall risks I think that we should proceed with cardiac catheterization. We have discussed the risks, benefits, and options of cardiac catheterization. He understands and agrees to proceed. We will schedule it to be done next  Thursday, the 31st. We'll plan on using the right radial approach. He is quite obese and accessing his femoral arteries would be challenging.    Philip J. Nahser, Jr., MD, FACC 06/28/2012, 9:38 AM Office - [phone] Pager [phone]  

## 2012-06-29 DIAGNOSIS — E785 Hyperlipidemia, unspecified: Secondary | ICD-10-CM | POA: Diagnosis present

## 2012-06-29 DIAGNOSIS — I2510 Atherosclerotic heart disease of native coronary artery without angina pectoris: Secondary | ICD-10-CM | POA: Diagnosis present

## 2012-06-29 DIAGNOSIS — D696 Thrombocytopenia, unspecified: Secondary | ICD-10-CM | POA: Diagnosis present

## 2012-06-29 DIAGNOSIS — I200 Unstable angina: Secondary | ICD-10-CM

## 2012-06-29 LAB — BASIC METABOLIC PANEL
BUN: 11 mg/dL (ref 6–23)
Chloride: 100 mEq/L (ref 96–112)
Glucose, Bld: 120 mg/dL — ABNORMAL HIGH (ref 70–99)
Potassium: 4.1 mEq/L (ref 3.5–5.1)
Sodium: 136 mEq/L (ref 135–145)

## 2012-06-29 LAB — CBC
HCT: 35 % — ABNORMAL LOW (ref 39.0–52.0)
Hemoglobin: 11.9 g/dL — ABNORMAL LOW (ref 13.0–17.0)
RBC: 4.08 MIL/uL — ABNORMAL LOW (ref 4.22–5.81)
WBC: 4.7 10*3/uL (ref 4.0–10.5)

## 2012-06-29 MED ORDER — PRASUGREL HCL 10 MG PO TABS: 10.0000 mg | ORAL_TABLET | Freq: Every day | ORAL | Status: DC

## 2012-06-29 MED ORDER — NITROGLYCERIN 0.4 MG SL SUBL: 0.4000 mg | SUBLINGUAL_TABLET | SUBLINGUAL | Status: DC | PRN

## 2012-06-29 MED ORDER — ATORVASTATIN CALCIUM 80 MG PO TABS: 80.0000 mg | ORAL_TABLET | Freq: Every day | ORAL | Status: DC

## 2012-06-29 MED ORDER — METFORMIN HCL 500 MG PO TABS: 500.0000 mg | ORAL_TABLET | Freq: Two times a day (BID) | ORAL | Status: DC

## 2012-06-29 MED FILL — Dextrose Inj 5%: INTRAVENOUS | Qty: 50 | Status: AC

## 2012-06-29 NOTE — Progress Notes (Signed)
    Subjective:  No chest pain or dyspnea.   Objective:  Vital Signs in the last 24 hours: Temp:  [98 F (36.7 C)-98.7 F (37.1 C)] 98 F (36.7 C) (11/01 0500) Pulse Rate:  [61-75] 62  (11/01 0500) Resp:  [15-18] 18  (11/01 0500) BP: (101-157)/(49-85) 121/50 mmHg (11/01 0500) SpO2:  [93 %-97 %] 96 % (11/01 0500) Weight:  [114.7 kg (252 lb 13.9 oz)-119.296 kg (263 lb)] 114.7 kg (252 lb 13.9 oz) (11/01 0500)  Intake/Output from previous day:   Physical Exam: Pt is alert and oriented, obese male in NAD HEENT: normal Neck: JVP - normal, carotids 2+= without bruits Lungs: CTA bilaterally CV: RRR without murmur or gallop Abd: soft, NT, Positive BS, no hepatomegaly Ext: no C/C/E, distal pulses intact and equal. Right radial site clear. Skin: warm/dry no rash  Lab Results:  Basename 06/29/12 0600  WBC 4.7  HGB 11.9*  PLT 97*    Basename 06/29/12 0600 06/28/12 1029  NA 136 --  K 4.1 --  CL 100 --  CO2 26 --  GLUCOSE 120* 144*  BUN 11 --  CREATININE 0.75 --   No results found for this basename: TROPONINI:2,CK,MB:2 in the last 72 hours  Tele: sinus rhythm  Assessment/Plan:  1. Unstable angina - severe prox LAD stenosis, treated successfully with a DES. ASA 81 mg and effient x 12 months. Will ask case manager to see him and evaluate cost of effient. If he cannot afford we could do one month of brilinta and then switch to plavix.  2. HTN - continue losartan  3. Diabetes, type 2 - cont home meds. Restart metformin tomorrow.  4. Hyperlipidemia - lipitor 80 mg. Lipids from 09.2013 reviewed.  Dispo: home today. Follow-up Dr Elease Hashimoto or PA/NP in 2 weeks.  Tonny Bollman, M.D. 06/29/2012, 8:06 AM

## 2012-06-29 NOTE — Plan of Care (Signed)
Problem: Phase III Progression Outcomes Goal: No angina with increased activity Outcome: Completed/Met Date Met:  06/29/12 Patient remains pain free no chest pain or shortness of breath Goal: Tolerating diet Outcome: Completed/Met Date Met:  06/29/12 Tolerating diet with good appetite this evening Goal: Discharge plan remains appropriate-arrangements made Outcome: Completed/Met Date Met:  06/29/12 Planned discharge home tomorrow if remains stable Goal: Vital signs stable Outcome: Completed/Met Date Met:  06/29/12 Vital signs stable sinus rhythm 60's Blood pressure 100-110's/ 50-60's. Goal: Vascular site scale level 0 - I Vascular Site Scale Level 0: No bruising/bleeding/hematoma Level I (Mild): Bruising/Ecchymosis, minimal bleeding/ooozing, palpable hematoma < 3 cm Level II (Moderate): Bleeding not affecting hemodynamic parameters, pseudoaneurysm, palpable hematoma > 3 cm Outcome: Completed/Met Date Met:  06/29/12 Right radial cath site level 0, positive reverse allen's, csms wnls

## 2012-06-29 NOTE — Discharge Summary (Signed)
CARDIOLOGY DISCHARGE SUMMARY   Patient ID: [name] MRN: [id number] DOB/AGE: [birth year] 66 y.o.  Admit date: 06/28/2012 Discharge date: 06/29/2012  Primary Discharge Diagnosis:     Unstable angina pectoris - status post 3.0 x 28 mm drug-eluting stent to the proximal LAD  Secondary Discharge Diagnosis:  Past Medical History  Diagnosis Date  . Type II or unspecified type diabetes mellitus without mention of complication, not stated as uncontrolled     hyperlipidemia      thrombocytopenia    . Unspecified essential hypertension   . Esophageal reflux   . Gout   . OSA (obstructive sleep apnea)     cpap started 12/2010  . Arthritis   . Umbilical hernia   . Ventral hernia   . Coronary artery disease   . Anginal pain   . Shortness of breath     " sometimes"   Procedures: PTCA and stenting of the proximal LAD  Hospital Course: [name] is a 66 year old male with no previous history of coronary artery disease. He has multiple cardiac risk factors. He was seen by Dr. Elease Hashimoto and had a dobutamine echo that showed inferior ischemia. He came to the [facility] for catheterization on 06/28/2012. Full results are below, but intervention was indicated to the LAD. [name] was taken upstairs to the main cath lab for PCI.  History of percutaneous intervention to the LAD and tolerated the procedure well. He was admitted overnight. His meds were reviewed and metformin was held. He was continued on his home blood pressure medications. He had Effient added to his medication regimen. Case management saw him to make sure he would be able to continue this medication. His lipid profile was reviewed and he had a statin added to his medication regimen. He was noted to have thrombocytopenia, but this has been present for at least a year.  His blood sugars were followed and were generally well-controlled. He is to hold his metformin for 48 hours and resume it in a.m. On 06/29/2012, [name] was seen by  cardiac rehabilitation and by Dr. Excell Seltzer. His vital signs were stable. His labs were reviewed and his platelets had decreased to 97,000, but he was having no bleeding issues and no other significant abnormalities were seen. He was ambulating without chest pain or shortness of breath. Dr. Excell Seltzer evaluated [name] and considered stable for discharge, to followup as an outpatient.  Labs:   Lab Results  Component Value Date   WBC 4.7 06/29/2012   HGB 11.9* 06/29/2012   HCT 35.0* 06/29/2012   MCV 85.8 06/29/2012   PLT 97* 06/29/2012     Lab 06/29/12 0600  NA 136  K 4.1  CL 100  CO2 26  BUN 11  CREATININE 0.75  CALCIUM 8.6  PROT --  BILITOT --  ALKPHOS --  ALT --  AST --  GLUCOSE 120*   Lipid Panel     Component Value Date/Time   CHOL 168 05/28/2012 1341   TRIG 198.0* 05/28/2012 1341   HDL 29.60* 05/28/2012 1341   CHOLHDL 6 05/28/2012 1341   VLDL 39.6 05/28/2012 1341   LDLCALC 99 05/28/2012 1341     Radiology: No results found.  Cardiac Cath: 06/28/2012 At [facility]:  Left Main: There is a 40 % ostial lesion and a 50-60% distal LM stenosis.  Left anterior Descending: moderately calcified. There is a 99% stenosis just after the 1st septal. This clearly is a ruptured plaque. There are several small  diagonals. There is evidence of collateral filling of the mid and distal LAD from the right injections  Left Circumflex: large vessel. There is a 50% stenosis proximally and a 50 mid LCx stenosis. There is a vessel that fills late via collaterals that is in the distribution of a high 1st OM or ramus intermediate vessel. I cannot tell where this vessel originates.  Right Coronary Artery: moderate - large in size. There is an eccentric 30-40% stenosis in the proximal vessel and a long 50% stenosis in the mid vessel. The PDA and PLSA are unremarkable.  LV Gram: Catheter induced ectopy. Normal LV function with EF of 65%-70%.  Main Lab - PCI 06/28/2012 Lesion Data:  Vessel: LAD/proximal  Percent  stenosis (pre): 95  TIMI-flow (pre): 3  Stent: 3.0 x 28 mm DES  Percent stenosis (post): 0  TIMI-flow (post): 3  EKG: 29-Jun-2012 05:12:46 [facility] ROUTINE RECORD Normal sinus rhythm Normal ECG 69mm/s 48mm/mV 100Hz  8.0.1 12SL 241 HD CID: 1 Referred by: Clevester Helzer Unconfirmed Vent. rate 62 BPM PR interval 198 ms QRS duration 100 ms QT/QTc 418/424 ms P-R-T axes -4 80 57  FOLLOW UP PLANS AND APPOINTMENTS No Known Allergies   Medication List     As of 06/29/2012 12:02 PM    TAKE these medications         aspirin EC 81 MG tablet   Take 81 mg by mouth daily.      atorvastatin 80 MG tablet   Commonly known as: LIPITOR   Take 1 tablet (80 mg total) by mouth daily.      furosemide 20 MG tablet   Commonly known as: LASIX   Take 1 tablet (20 mg total) by mouth daily.      losartan 50 MG tablet   Commonly known as: COZAAR   Take 1 tablet (50 mg total) by mouth daily.      metFORMIN 500 MG tablet   Commonly known as: GLUCOPHAGE   Take 1 tablet (500 mg total) by mouth 2 (two) times daily with a meal. Hold until 06/30/2012.      nitroGLYCERIN 0.4 MG SL tablet   Commonly known as: NITROSTAT   Place 1 tablet (0.4 mg total) under the tongue every 5 (five) minutes as needed for chest pain.      prasugrel 10 MG Tabs   Commonly known as: EFFIENT   Take 1 tablet (10 mg total) by mouth daily.      prasugrel 10 MG Tabs   Commonly known as: EFFIENT   Take 1 tablet (10 mg total) by mouth daily.          Discharge Orders    Future Appointments: Provider: Department: Dept Phone: Center:   07/17/2012 8:45 AM Vesta Mixer, MD [facility] [phone] [facility]   08/27/2012 1:00 PM Newt Lukes, MD [facility] [phone] [facility]     Future Orders Please Complete By Expires   Amb Referral to Cardiac Rehabilitation      Diet - low sodium heart healthy      Diet Carb Modified      Increase activity slowly        Follow-up  Information    Follow up with Elyn Aquas., MD. On 07/17/2012. (at 8:45 am)    Contact information:   [address] [facility] [place] [address] [phone]          BRING ALL MEDICATIONS WITH YOU TO FOLLOW UP APPOINTMENTS  Time spent  with patient to include physician time: 35 min Signed: Theodore Demark 06/29/2012, 12:02 PM Co-Sign MD

## 2012-06-29 NOTE — Care Management Note (Signed)
    Page 1 of 1   06/29/2012     11:05:26 AM   CARE MANAGEMENT NOTE 06/29/2012  Patient:  [name], [name]   Account Number:  [id number]  Date Initiated:  06/29/2012  Documentation initiated by:  CRAFT,TERRI  Subjective/Objective Assessment:   66 yo male admitted 06/28/12 with blockage     Action/Plan:   D/C when medically stable   Anticipated DC Date:  06/29/2012   Anticipated DC Plan:  HOME/SELF CARE      DC Planning Services  CM consult  Medication Assistance               Status of service:  Completed, signed off  Discharge Disposition:  HOME/SELF CARE  Per UR Regulation:  Reviewed for med. necessity/level of care/duration of stay  Comments:  111/1/13, Kathi Der RNC-MNN, BSN, [phone], CM received referral.  CM met with pt.  Pt has Effient card.  Discussed medication and pharmacy discounts.  Questions answered.

## 2012-06-29 NOTE — Progress Notes (Signed)
CARDIAC REHAB PHASE I   PRE:  Rate/Rhythm: 69SR  BP:  Supine:   Sitting: 129/60  Standing:    SaO2:   MODE:  Ambulation: 600 ft   POST:  Rate/Rhythem: 77SR  BP:  Supine:   Sitting: 136/62  Standing:    SaO2:  0750-0900 Pt walked 600 ft with steady gait. Denied CP. Tolerated well. Education completed with pt and wife. Wife to reenforce ed with pt as he said he does not read. Permission given to refer to [facility] Phase 2 but only can do if son can drive. Neither pt nor wife drive.   Duanne Limerick

## 2012-07-17 ENCOUNTER — Encounter: Payer: Self-pay | Admitting: Cardiovascular Disease

## 2012-07-17 ENCOUNTER — Ambulatory Visit (INDEPENDENT_AMBULATORY_CARE_PROVIDER_SITE_OTHER): Payer: Medicare Other | Admitting: Cardiovascular Disease

## 2012-07-17 VITALS — BP 132/62 | HR 85 | Ht 68.0 in | Wt 251.1 lb

## 2012-07-17 DIAGNOSIS — I2510 Atherosclerotic heart disease of native coronary artery without angina pectoris: Secondary | ICD-10-CM

## 2012-07-17 NOTE — Assessment & Plan Note (Signed)
Wight is doing better. He's not having any further episodes of angina after having PCI of his left anterior descending artery. He has a 3.0 x 28 mm drug-eluting stent. He's on Atacand and aspirin.  We'll continue with his same medications. I'll see him again in 6 months. We'll check fasting lipids, and hepatic profile, and basic metabolic profile at that time. I have asked him to exercise on regular basis. We will give him information on a low-fat diet. We'll also give him information on a low-salt diet.

## 2012-07-17 NOTE — Progress Notes (Signed)
[name] Date of Birth  [birth year]       [facility]    [place] [address]  [address] [place], [place]  [address]   [place], [place]  [address] [phone]     [phone]   Fax  [phone]    Fax [phone]  Problem List: 1. CAD -  3.0 x 28 mm drug-eluting stent 06-28-12 2. indigestion  History of Present Illness:  [name] is a 66 yo with hx of indigestion / chest pain  that develops with walking.  The pain is a "ache" and lasts as long as he is walking.   It causes him some dyspnea.  It typically goes away when he stops.  He has lots of belching.  Hx of asthma. Hx of smoking in the past.  No regular exercise.  He is now retired.  He gets a little exercise every day.  He had a dobutamine echo that revealed mild ischemia in the inferior wall.    He had a cardiac cath which revealed a tight irregular LAD stenosis. He had PTCA and stenting of his LAD by Dr. Excell Seltzer.  We have advised him to stop  eating fried foods. He still is eating fried potatoes on a regular basis.   Current Outpatient Prescriptions on File Prior to Visit  Medication Sig Dispense Refill  . aspirin EC 81 MG tablet Take 81 mg by mouth daily.      Marland Kitchen atorvastatin (LIPITOR) 80 MG tablet Take 1 tablet (80 mg total) by mouth daily.  30 tablet  11  . furosemide (LASIX) 20 MG tablet Take 1 tablet (20 mg total) by mouth daily.  30 tablet  3  . losartan (COZAAR) 50 MG tablet Take 1 tablet (50 mg total) by mouth daily.  30 tablet  5  . metFORMIN (GLUCOPHAGE) 500 MG tablet Take 1 tablet (500 mg total) by mouth 2 (two) times daily with a meal. Hold until 06/30/2012.  180 tablet  2  . nitroGLYCERIN (NITROSTAT) 0.4 MG SL tablet Place 1 tablet (0.4 mg total) under the tongue every 5 (five) minutes as needed for chest pain.  25 tablet  3  . prasugrel (EFFIENT) 10 MG TABS Take 1 tablet (10 mg total) by mouth daily.  30 tablet  11    No Known Allergies  Past Medical  History  Diagnosis Date  . Type II or unspecified type diabetes mellitus without mention of complication, not stated as uncontrolled   . Unspecified essential hypertension   . Esophageal reflux   . Gout   . OSA (obstructive sleep apnea)     cpap started 12/2010  . Arthritis   . Umbilical hernia   . Ventral hernia   . Coronary artery disease   . Anginal pain   . Shortness of breath     " sometimes"    Past Surgical History  Procedure Date  . Appendectomy 04/2004  . Colon surgery   . Leg surgery   . Kidney stone surgery   . Ankle surgery   . Hernia repair 06/06/11    lap repair of incarcerated umbilical hernia with mesh  . Coronary angioplasty with stent placement 06/28/2012    DES   to LAD    History  Smoking status  . Former Smoker -- 2.0 packs/day for 40 years  . Types: Cigarettes  . Quit date: 08/29/1996  Smokeless tobacco  . Not on file  History  Alcohol Use  . Yes    Comment: 2 BEERS A DAY    Family History  Problem Relation Age of Onset  . Coronary artery disease    . Diabetes    . Colon cancer    . Lung cancer Sister   . Cancer Sister     lung  . Cancer Brother     lung  . Cancer Sister     lung    Reviw of Systems:  Reviewed in the HPI.  All other systems are negative.  Physical Exam: Blood pressure 132/62, pulse 85, height 5\' 8"  (1.727 m), weight 251 lb 1.9 oz (113.907 kg), SpO2 98.00%. General: Well developed, well nourished, in no acute distress.  Head: Normocephalic, atraumatic, sclera non-icteric, mucus membranes are moist,   Neck: Supple. Carotids are 2 + without bruits. No JVD  Lungs: Clear bilaterally to auscultation.  Few rhonchi with cough  Heart: regular rate.  normal  S1 S2. No murmurs, gallops or rubs.  Abdomen: Soft, non-tender, obese.  Exogenous obesity - difficult to access femoral arteries.  Msk:  Strength and tone are normal  Extremities: No clubbing or cyanosis. 1-2 +  edema.  Distal pedal pulses are 2+ and equal  bilaterally.  Neuro: Alert and oriented X 3. Moves all extremities spontaneously.  Psych:  Responds to questions appropriately with a normal affect.  ECG: Oct. 25, 2013 - NSR at 71, no ST or T wave changes  Assessment / Plan:

## 2012-07-17 NOTE — Patient Instructions (Addendum)
Your physician wants you to follow-up in: 6 months  You will receive a reminder letter in the mail two months in advance. If you don't receive a letter, please call our office to schedule the follow-up appointment.  REDUCE HIGH SODIUM FOODS LIKE CANNED SOUP, GRAVY, SAUCES, READY PREPARED FOODS LIKE FROZEN FOODS; LEAN CUISINE, LASAGNA. BACON, SAUSAGE, LUNCH MEAT, FAST FOODS.Marland Kitchen   DASH Diet The DASH diet stands for "Dietary Approaches to Stop Hypertension." It is a healthy eating plan that has been shown to reduce high blood pressure (hypertension) in as little as 14 days, while also possibly providing other significant health benefits. These other health benefits include reducing the risk of breast cancer after menopause and reducing the risk of type 2 diabetes, heart disease, colon cancer, and stroke. Health benefits also include weight loss and slowing kidney failure in patients with chronic kidney disease.  DIET GUIDELINES  Limit salt (sodium). Your diet should contain less than 1500 mg of sodium daily.  Limit refined or processed carbohydrates. Your diet should include mostly whole grains. Desserts and added sugars should be used sparingly.  Include small amounts of heart-healthy fats. These types of fats include nuts, oils, and tub margarine. Limit saturated and trans fats. These fats have been shown to be harmful in the body. CHOOSING FOODS  The following food groups are based on a 2000 calorie diet. See your Registered Dietitian for individual calorie needs. Grains and Grain Products (6 to 8 servings daily)  Eat More Often: Whole-wheat bread, brown rice, whole-grain or wheat pasta, quinoa, popcorn without added fat or salt (air popped).  Eat Less Often: White bread, white pasta, white rice, cornbread. Vegetables (4 to 5 servings daily)  Eat More Often: Fresh, frozen, and canned vegetables. Vegetables may be raw, steamed, roasted, or grilled with a minimal amount of fat.  Eat Less  Often/Avoid: Creamed or fried vegetables. Vegetables in a cheese sauce. Fruit (4 to 5 servings daily)  Eat More Often: All fresh, canned (in natural juice), or frozen fruits. Dried fruits without added sugar. One hundred percent fruit juice ( cup [237 mL] daily).  Eat Less Often: Dried fruits with added sugar. Canned fruit in light or heavy syrup. [employer], Fish, and Poultry (2 servings or less daily. One serving is 3 to 4 oz [85-114 g]).  Eat More Often: Ninety percent or leaner ground beef, tenderloin, sirloin. Round cuts of beef, chicken breast, [place] breast. All fish. Grill, bake, or broil your meat. Nothing should be fried.  Eat Less Often/Avoid: Fatty cuts of meat, [place], or chicken leg, thigh, or wing. Fried cuts of meat or fish. Dairy (2 to 3 servings)  Eat More Often: Low-fat or fat-free milk, low-fat plain or light yogurt, reduced-fat or part-skim cheese.  Eat Less Often/Avoid: Milk (whole, 2%).Whole milk yogurt. Full-fat cheeses. Nuts, Seeds, and Legumes (4 to 5 servings per week)  Eat More Often: All without added salt.  Eat Less Often/Avoid: Salted nuts and seeds, canned beans with added salt. Fats and Sweets (limited)  Eat More Often: Vegetable oils, tub margarines without trans fats, sugar-free gelatin. Mayonnaise and salad dressings.  Eat Less Often/Avoid: Coconut oils, palm oils, butter, stick margarine, cream, half and half, cookies, candy, pie. FOR MORE INFORMATION The Dash Diet Eating Plan: www.dashdiet.org Document Released: 08/04/2011 Document Revised: 11/07/2011 Document Reviewed: 08/04/2011 [facility] Patient Information 2013 [place], [place].  Fat and Cholesterol Control Diet Cholesterol is a wax-like substance. It comes from your liver and is found in certain foods. There  is good (HDL) and bad (LDL) cholesterol. Too much cholesterol in your blood can affect your heart. Certain foods can lower or raise your cholesterol. Eat foods that are low in  cholesterol. Saturated and trans fats are bad fats found in foods that will raise your cholesterol. Do not eat foods that are high in saturated and trans fats. FOODS HIGHER IN SATURATED AND TRANS FATS  Dairy products, such as whole milk, eggs, cheese, cream, and butter.  Fatty meats, such as hot dogs, sausage, and salami.  Fried foods.  Trans fats which are found in margarine and pre-made cookies, crackers, and baked goods.  Tropical oils, such as coconut and palm oils. Read package labels at the store. Do not buy products that use saturated or trans fats or hydrogenated oils. Find foods labeled:  Low-fat.  Low-saturated fat.  Trans-fat-free.  Low-cholesterol. FOODS LOWER IN CHOLESTEROL   Fruit.  Vegetables.  Beans, peas, and lentils.  Fish.  Lean meat, such as chicken (without skin) or ground [place].  Grains, such as barley, rice, couscous, bulgur wheat, and pasta.  Heart-healthy tub margarine. PREPARING YOUR FOOD  Broil, bake, steam, or roast foods. Do not fry food.  Use non-stick cooking sprays.  Use lemon or herbs to flavor food instead of using butter or stick margarine.  Use nonfat yogurt, salsa, or low-fat dressings for salads. Document Released: 02/14/2012 Document Reviewed: 11/15/2011 [facility] Patient Information 2013 [place], [place].  Exercise to Stay Healthy Exercise helps you become and stay healthy. EXERCISE IDEAS AND TIPS Choose exercises that:  You enjoy.  Fit into your day. You do not need to exercise really hard to be healthy. You can do exercises at a slow or medium level and stay healthy. You can:  Stretch before and after working out.  Try yoga, Pilates, or tai chi.  Lift weights.  Walk fast, swim, jog, run, climb stairs, bicycle, dance, or rollerskate.  Take aerobic classes. Exercises that burn about 150 calories:  Running 1  miles in 15 minutes.  Playing volleyball for 45 to 60 minutes.  Washing and waxing a car for 45  to 60 minutes.  Playing touch football for 45 minutes.  Walking 1  miles in 35 minutes.  Pushing a stroller 1  miles in 30 minutes.  Playing basketball for 30 minutes.  Raking leaves for 30 minutes.  Bicycling 5 miles in 30 minutes.  Walking 2 miles in 30 minutes.  Dancing for 30 minutes.  Shoveling snow for 15 minutes.  Swimming laps for 20 minutes.  Walking up stairs for 15 minutes.  Bicycling 4 miles in 15 minutes.  Gardening for 30 to 45 minutes.  Jumping rope for 15 minutes.  Washing windows or floors for 45 to 60 minutes. Document Released: 09/17/2010 Document Revised: 11/07/2011 Document Reviewed: 09/17/2010 [facility] Patient Information 2013 [place], [place].

## 2012-08-13 ENCOUNTER — Encounter: Payer: Self-pay | Admitting: Internal Medicine

## 2012-08-13 ENCOUNTER — Encounter (HOSPITAL_COMMUNITY): Payer: Self-pay | Admitting: *Deleted

## 2012-08-13 ENCOUNTER — Telehealth: Payer: Self-pay | Admitting: *Deleted

## 2012-08-13 ENCOUNTER — Inpatient Hospital Stay (HOSPITAL_COMMUNITY)
Admission: EM | Admit: 2012-08-13 | Discharge: 2012-08-17 | DRG: 378 | Disposition: A | Discharge status: Home / Self Care | Payer: Medicare Other | Attending: Internal Medicine | Admitting: Internal Medicine

## 2012-08-13 ENCOUNTER — Other Ambulatory Visit (INDEPENDENT_AMBULATORY_CARE_PROVIDER_SITE_OTHER): Payer: Medicare Other

## 2012-08-13 ENCOUNTER — Ambulatory Visit (INDEPENDENT_AMBULATORY_CARE_PROVIDER_SITE_OTHER): Payer: Medicare Other | Admitting: Internal Medicine

## 2012-08-13 VITALS — BP 130/62 | HR 87 | Temp 98.6°F | Ht 68.0 in | Wt 241.4 lb

## 2012-08-13 DIAGNOSIS — K254 Chronic or unspecified gastric ulcer with hemorrhage: Principal | ICD-10-CM | POA: Diagnosis present

## 2012-08-13 DIAGNOSIS — I2510 Atherosclerotic heart disease of native coronary artery without angina pectoris: Secondary | ICD-10-CM

## 2012-08-13 DIAGNOSIS — R1013 Epigastric pain: Secondary | ICD-10-CM

## 2012-08-13 DIAGNOSIS — Z87891 Personal history of nicotine dependence: Secondary | ICD-10-CM

## 2012-08-13 DIAGNOSIS — Z9861 Coronary angioplasty status: Secondary | ICD-10-CM

## 2012-08-13 DIAGNOSIS — Z7982 Long term (current) use of aspirin: Secondary | ICD-10-CM

## 2012-08-13 DIAGNOSIS — G4733 Obstructive sleep apnea (adult) (pediatric): Secondary | ICD-10-CM | POA: Diagnosis present

## 2012-08-13 DIAGNOSIS — K921 Melena: Secondary | ICD-10-CM

## 2012-08-13 DIAGNOSIS — Z79899 Other long term (current) drug therapy: Secondary | ICD-10-CM

## 2012-08-13 DIAGNOSIS — E785 Hyperlipidemia, unspecified: Secondary | ICD-10-CM | POA: Diagnosis present

## 2012-08-13 DIAGNOSIS — R609 Edema, unspecified: Secondary | ICD-10-CM

## 2012-08-13 DIAGNOSIS — K259 Gastric ulcer, unspecified as acute or chronic, without hemorrhage or perforation: Secondary | ICD-10-CM | POA: Diagnosis present

## 2012-08-13 DIAGNOSIS — E119 Type 2 diabetes mellitus without complications: Secondary | ICD-10-CM | POA: Diagnosis present

## 2012-08-13 DIAGNOSIS — Z87442 Personal history of urinary calculi: Secondary | ICD-10-CM

## 2012-08-13 DIAGNOSIS — K219 Gastro-esophageal reflux disease without esophagitis: Secondary | ICD-10-CM | POA: Diagnosis present

## 2012-08-13 DIAGNOSIS — E118 Type 2 diabetes mellitus with unspecified complications: Secondary | ICD-10-CM | POA: Diagnosis present

## 2012-08-13 DIAGNOSIS — R52 Pain, unspecified: Secondary | ICD-10-CM

## 2012-08-13 DIAGNOSIS — D62 Acute posthemorrhagic anemia: Secondary | ICD-10-CM | POA: Diagnosis present

## 2012-08-13 DIAGNOSIS — K922 Gastrointestinal hemorrhage, unspecified: Secondary | ICD-10-CM

## 2012-08-13 DIAGNOSIS — I10 Essential (primary) hypertension: Secondary | ICD-10-CM

## 2012-08-13 DIAGNOSIS — I119 Hypertensive heart disease without heart failure: Secondary | ICD-10-CM | POA: Diagnosis present

## 2012-08-13 DIAGNOSIS — H6120 Impacted cerumen, unspecified ear: Secondary | ICD-10-CM

## 2012-08-13 DIAGNOSIS — M109 Gout, unspecified: Secondary | ICD-10-CM

## 2012-08-13 LAB — CBC WITH DIFFERENTIAL/PLATELET
Basophils Absolute: 0 10*3/uL (ref 0.0–0.1)
Basophils Relative: 0.2 % (ref 0.0–3.0)
Basophils Relative: 1 % (ref 0–1)
Eosinophils Relative: 1.9 % (ref 0.0–5.0)
HCT: 17.1 % — CL (ref 39.0–52.0)
Hemoglobin: 5.2 g/dL — CL (ref 13.0–17.0)
Hemoglobin: 5.6 g/dL — CL (ref 13.0–17.0)
MCHC: 29.9 g/dL — ABNORMAL LOW (ref 30.0–36.0)
MCV: 75.2 fl — ABNORMAL LOW (ref 78.0–100.0)
Monocytes Absolute: 0.5 10*3/uL (ref 0.1–1.0)
Neutro Abs: 3.3 10*3/uL (ref 1.7–7.7)
Neutrophils Relative %: 67.5 % (ref 43.0–77.0)
Neutrophils Relative %: 67 % (ref 43–77)
Platelets: 152 10*3/uL (ref 150–400)
RBC: 2.27 Mil/uL — ABNORMAL LOW (ref 4.22–5.81)
RDW: 16.7 % — ABNORMAL HIGH (ref 11.5–15.5)
WBC: 4.8 10*3/uL (ref 4.5–10.5)

## 2012-08-13 LAB — OCCULT BLOOD, POC DEVICE: Fecal Occult Bld: POSITIVE — AB

## 2012-08-13 LAB — BASIC METABOLIC PANEL
BUN: 18 mg/dL (ref 6–23)
GFR: 105.71 mL/min (ref >60.00)
Glucose, Bld: 118 mg/dL — ABNORMAL HIGH (ref 70–99)
Potassium: 4.2 mEq/L (ref 3.5–5.1)

## 2012-08-13 LAB — COMPREHENSIVE METABOLIC PANEL
AST: 20 U/L (ref 0–37)
Albumin: 3.7 g/dL (ref 3.5–5.2)
Alkaline Phosphatase: 106 U/L (ref 39–117)
Chloride: 102 mEq/L (ref 96–112)
Potassium: 4.6 mEq/L (ref 3.5–5.1)
Sodium: 136 mEq/L (ref 135–145)
Total Bilirubin: 0.4 mg/dL (ref 0.3–1.2)

## 2012-08-13 LAB — HEPATIC FUNCTION PANEL
ALT: 18 U/L (ref 0–53)
AST: 23 U/L (ref 0–37)
Albumin: 3.7 g/dL (ref 3.5–5.2)

## 2012-08-13 LAB — POCT I-STAT TROPONIN I: Troponin i, poc: 0 ng/mL (ref 0.00–0.08)

## 2012-08-13 LAB — PREPARE RBC (CROSSMATCH)

## 2012-08-13 LAB — HEMOGLOBIN A1C: Hgb A1c MFr Bld: 5.7 % (ref 4.6–6.5)

## 2012-08-13 MED ORDER — SODIUM CHLORIDE 0.9 % IV SOLN
8.0000 mg/h | INTRAVENOUS | Status: DC
  Administered 2012-08-14 (×3): 8 mg/h via INTRAVENOUS
  Filled 2012-08-13 (×10): qty 80

## 2012-08-13 MED ORDER — ONDANSETRON HCL 4 MG/2ML IJ SOLN: 4.0000 mg | Freq: Three times a day (TID) | INTRAMUSCULAR | Status: AC | PRN

## 2012-08-13 MED ORDER — SODIUM CHLORIDE 0.9 % IV SOLN
INTRAVENOUS | Status: AC
  Administered 2012-08-14: 1000 mL via INTRAVENOUS

## 2012-08-13 MED ORDER — HYDROMORPHONE HCL PF 1 MG/ML IJ SOLN: 0.5000 mg | INTRAMUSCULAR | Status: AC | PRN

## 2012-08-13 MED ORDER — SODIUM CHLORIDE 0.9 % IJ SOLN
3.0000 mL | Freq: Two times a day (BID) | INTRAMUSCULAR | Status: DC
  Administered 2012-08-13 – 2012-08-14 (×3): 3 mL via INTRAVENOUS

## 2012-08-13 MED ORDER — INSULIN ASPART 100 UNIT/ML ~~LOC~~ SOLN
0.0000 [IU] | SUBCUTANEOUS | Status: DC
  Administered 2012-08-15 – 2012-08-16 (×2): 2 [IU] via SUBCUTANEOUS
  Administered 2012-08-16 (×2): 1 [IU] via SUBCUTANEOUS

## 2012-08-13 MED ORDER — ONDANSETRON HCL 4 MG/2ML IJ SOLN: 4.0000 mg | Freq: Four times a day (QID) | INTRAMUSCULAR | Status: DC | PRN

## 2012-08-13 MED ORDER — SODIUM CHLORIDE 0.9 % IJ SOLN: 3.0000 mL | INTRAMUSCULAR | Status: DC | PRN

## 2012-08-13 MED ORDER — SODIUM CHLORIDE 0.9 % IV SOLN: 250.0000 mL | INTRAVENOUS | Status: DC | PRN

## 2012-08-13 MED ORDER — PANTOPRAZOLE SODIUM 40 MG IV SOLR
80.0000 mg | Freq: Once | INTRAVENOUS | Status: DC
  Filled 2012-08-13: qty 80

## 2012-08-13 MED ORDER — ONDANSETRON HCL 4 MG PO TABS: 4.0000 mg | ORAL_TABLET | Freq: Four times a day (QID) | ORAL | Status: DC | PRN

## 2012-08-13 MED ORDER — MORPHINE SULFATE 2 MG/ML IJ SOLN
1.0000 mg | INTRAMUSCULAR | Status: DC | PRN
  Administered 2012-08-14: 1 mg via INTRAVENOUS
  Filled 2012-08-13: qty 1

## 2012-08-13 MED ORDER — RANITIDINE HCL 300 MG PO TABS: 300.0000 mg | ORAL_TABLET | Freq: Every day | ORAL | Status: DC

## 2012-08-13 NOTE — Telephone Encounter (Signed)
Pt called back LMOM. Called back spoke with wife son is taking him to [facility]...Raechel Chute

## 2012-08-13 NOTE — H&P (Signed)
PCP:   Rene Paci, MD   Chief Complaint:  Weakness and abd pain  HPI: 66 yo male s/p stent to LAD in oct 2013 on asa/effient comes in with one week of worsening epigastric abd pain that is worse after eating and persistent.  No n/v.  Stools have been dark for over a week also no brbpr.  No diarrhea.  No fevers.  No sob. But generalized weakness.  No cp.  No hematuria.  hgb was checked a month ago by pcp and was over 11 now is around 5.  No syncope.  Review of Systems:  O/w neg  Past Medical History: Past Medical History  Diagnosis Date  . Type II or unspecified type diabetes mellitus without mention of complication, not stated as uncontrolled   . Unspecified essential hypertension   . Esophageal reflux   . Gout   . OSA (obstructive sleep apnea)     cpap started 12/2010  . Arthritis   . Umbilical hernia   . Ventral hernia   . Coronary artery disease     PTCA/DES to LAD 06/28/12   Past Surgical History  Procedure Date  . Appendectomy 04/2004  . Colon surgery   . Leg surgery   . Kidney stone surgery   . Ankle surgery   . Hernia repair 06/06/11    lap repair of incarcerated umbilical hernia with mesh  . Coronary angioplasty with stent placement 06/28/2012    DES   to LAD    Medications: Prior to Admission medications   Medication Sig Start Date End Date Taking? Authorizing Provider  aspirin EC 81 MG tablet Take 81 mg by mouth daily.   Yes Historical Provider, MD  atorvastatin (LIPITOR) 80 MG tablet Take 1 tablet (80 mg total) by mouth daily. 06/29/12  Yes Rhonda G Barrett, PA  furosemide (LASIX) 20 MG tablet Take 1 tablet (20 mg total) by mouth daily. 05/28/12  Yes Newt Lukes, MD  losartan (COZAAR) 50 MG tablet Take 1 tablet (50 mg total) by mouth daily. 06/11/12  Yes Newt Lukes, MD  metFORMIN (GLUCOPHAGE) 500 MG tablet Take 1 tablet (500 mg total) by mouth 2 (two) times daily with a meal. Hold until 06/30/2012. 06/29/12 06/29/13 Yes Rhonda G Barrett, PA   nitroGLYCERIN (NITROSTAT) 0.4 MG SL tablet Place 1 tablet (0.4 mg total) under the tongue every 5 (five) minutes as needed for chest pain. 06/29/12  Yes Rhonda G Barrett, PA  prasugrel (EFFIENT) 10 MG TABS Take 1 tablet (10 mg total) by mouth daily. 06/29/12  Yes Rhonda G Barrett, PA  ranitidine (ZANTAC) 300 MG tablet Take 1 tablet (300 mg total) by mouth at bedtime. 08/13/12  Yes Newt Lukes, MD    Allergies:  No Known Allergies  Social History:  reports that he quit smoking about 15 years ago. His smoking use included Cigarettes. He has a 80 pack-year smoking history. He does not have any smokeless tobacco history on file. He reports that he drinks alcohol. He reports that he does not use illicit drugs.  Family History: Family History  Problem Relation Age of Onset  . Coronary artery disease    . Diabetes    . Colon cancer    . Lung cancer Sister   . Cancer Sister     lung  . Cancer Brother     lung  . Cancer Sister     lung    Physical Exam: Filed Vitals:   08/13/12 1600 08/13/12 1851 08/13/12  1921  BP: 120/47 114/55 125/57  Pulse: 81 79   Temp: 97.9 F (36.6 C)  98.3 F (36.8 C)  TempSrc: Oral  Oral  Resp: 18 16   SpO2: 100% 100%    General appearance: alert, cooperative, no distress and pale Neck: no JVD and supple, symmetrical, trachea midline Lungs: clear to auscultation bilaterally Heart: regular rate and rhythm, S1, S2 normal, no murmur, click, rub or gallop Abdomen: soft nd ttp epi area, pos bs no r/g nonacute abd Extremities: extremities normal, atraumatic, no cyanosis or edema Pulses: 2+ and symmetric Skin: Skin color, texture, turgor normal. No rashes or lesions Neurologic: Grossly normal    Labs on Admission:   [facility] 08/13/12 1634 08/13/12 1442  NA 136 136  K 4.6 4.2  CL 102 102  CO2 23 26  GLUCOSE 118* 118*  BUN 17 18  CREATININE 0.78 0.8  CALCIUM 9.0 8.6  MG -- --  PHOS -- --    Basename 08/13/12 1634 08/13/12 1442  AST 20 23   ALT 14 18  ALKPHOS 106 91  BILITOT 0.4 0.8  PROT 6.9 6.8  ALBUMIN 3.7 3.7    Basename 08/13/12 1634 08/13/12 1442  WBC 4.9 4.8  NEUTROABS 3.3 3.2  HGB 5.6* 5.2 Repeated and verified X2.*  HCT 18.7* 17.1 Repeated and verified X2.*  MCV 77.0* 75.2*  PLT 152 148.0*    Radiological Exams on Admission: No results found.  Assessment/Plan 66 yo male with recent stent to LAD on anticoagulants comes in with acute gib anemia  Principal Problem:  *Acute blood loss anemia Active Problems:  DIABETES MELLITUS, TYPE II  HYPERTENSION  GERD  OBSTRUCTIVE SLEEP APNEA  Coronary artery disease s/p stent LAD 10/13  Hyperlipidemia LDL goal < 70  Abdominal pain, acute, epigastric  Probable upper gib.  Transfuse 2 units prbc.  Serial h/h.  Gi consulted.  Keep npo after midnight for probable egd tomorrow.  protonix gtt.  Hold asa/effient and other meds.  ssi for his dm.  Serial trop also.  ekg nsr no acute changes.  Will probably need to involve his cardiologist also due to recent stenting in the setting of acute gib now.  Vss.   Full code.    Jenalee Trevizo A 08/13/2012, 7:27 PM

## 2012-08-13 NOTE — ED Notes (Signed)
Family at bedside. 

## 2012-08-13 NOTE — ED Provider Notes (Signed)
History     CSN: [id number]  Arrival date & time 08/13/12  1545   First MD Initiated Contact with Patient 08/13/12 1725      Chief Complaint  Patient presents with  . Weakness  . Chest Pain    HPI The patient presents with epigastric pain and weakness.  He notes that the weakness has developed over the past days, gradually.  Concurrently the patient continues to have chest pain.  This pain began approximately one month ago, after he coronary stent.  Since that time there has been chronic, intermittent Taul chest and epigastric discomfort.  This is largely nonradiating, though over the past day has become increasingly present throughout the abdomen.  The patient denies vomiting or diarrhea.  He denies melena or blood per rectum.  He states that he is constipated. He denies dyspnea, confusion or disorientation. Past Medical History  Diagnosis Date  . Type II or unspecified type diabetes mellitus without mention of complication, not stated as uncontrolled   . Unspecified essential hypertension   . Esophageal reflux   . Gout   . OSA (obstructive sleep apnea)     cpap started 12/2010  . Arthritis   . Umbilical hernia   . Ventral hernia   . Coronary artery disease     PTCA/DES to LAD 06/28/12    Past Surgical History  Procedure Date  . Appendectomy 04/2004  . Colon surgery   . Leg surgery   . Kidney stone surgery   . Ankle surgery   . Hernia repair 06/06/11    lap repair of incarcerated umbilical hernia with mesh  . Coronary angioplasty with stent placement 06/28/2012    DES   to LAD    Family History  Problem Relation Age of Onset  . Coronary artery disease    . Diabetes    . Colon cancer    . Lung cancer Sister   . Cancer Sister     lung  . Cancer Brother     lung  . Cancer Sister     lung    History  Substance Use Topics  . Smoking status: Former Smoker -- 2.0 packs/day for 40 years    Types: Cigarettes    Quit date: 08/29/1996  . Smokeless tobacco: Not on  file  . Alcohol Use: Yes     Comment: 2 BEERS A DAY      Review of Systems  Constitutional:       Per HPI, otherwise negative  HENT:       Per HPI, otherwise negative  Eyes: Negative.   Respiratory:       Per HPI, otherwise negative  Cardiovascular:       Per HPI, otherwise negative  Gastrointestinal: Negative for vomiting.  Genitourinary: Negative.   Musculoskeletal:       Per HPI, otherwise negative  Skin: Negative.   Neurological: Negative for syncope.    Allergies  Review of patient's allergies indicates no known allergies.  Home Medications   Current Outpatient Rx  Name  Route  Sig  Dispense  Refill  . ASPIRIN EC 81 MG PO TBEC   Oral   Take 81 mg by mouth daily.         . ATORVASTATIN CALCIUM 80 MG PO TABS   Oral   Take 1 tablet (80 mg total) by mouth daily.   30 tablet   11   . FUROSEMIDE 20 MG PO TABS   Oral   Take 1 tablet (  20 mg total) by mouth daily.   30 tablet   3   . LOSARTAN POTASSIUM 50 MG PO TABS   Oral   Take 1 tablet (50 mg total) by mouth daily.   30 tablet   5   . METFORMIN HCL 500 MG PO TABS   Oral   Take 1 tablet (500 mg total) by mouth 2 (two) times daily with a meal. Hold until 06/30/2012.   180 tablet   2   . NITROGLYCERIN 0.4 MG SL SUBL   Sublingual   Place 1 tablet (0.4 mg total) under the tongue every 5 (five) minutes as needed for chest pain.   25 tablet   3   . PRASUGREL HCL 10 MG PO TABS   Oral   Take 1 tablet (10 mg total) by mouth daily.   30 tablet   11   . RANITIDINE HCL 300 MG PO TABS   Oral   Take 1 tablet (300 mg total) by mouth at bedtime.   30 tablet   3     BP 120/47  Pulse 81  Temp 97.9 F (36.6 C) (Oral)  Resp 18  SpO2 100%  Physical Exam  Nursing note and vitals reviewed. Constitutional: He is oriented to person, place, and time. He appears well-developed. No distress.  HENT:  Head: Normocephalic and atraumatic.  Eyes: Conjunctivae normal and EOM are normal.  Cardiovascular:  Normal rate and regular rhythm.   Pulmonary/Chest: Effort normal. No stridor. No respiratory distress.  Abdominal: He exhibits no distension.  Genitourinary: Rectal exam shows external hemorrhoid. Rectal exam shows no fissure.       Black stool, nontender exam. Hemoccult positive  Musculoskeletal: He exhibits no edema.  Neurological: He is alert and oriented to person, place, and time.  Skin: Skin is warm and dry.  Psychiatric: He has a normal mood and affect.    ED Course  Procedures (including critical care time)  Labs Reviewed  CBC WITH DIFFERENTIAL - Abnormal; Notable for the following:    RBC 2.43 (*)     Hemoglobin 5.6 (*)     HCT 18.7 (*)     MCV 77.0 (*)     MCH 23.0 (*)     MCHC 29.9 (*)     RDW 16.7 (*)     All other components within normal limits  COMPREHENSIVE METABOLIC PANEL - Abnormal; Notable for the following:    Glucose, Bld 118 (*)     All other components within normal limits  POCT I-STAT TROPONIN I  TYPE AND SCREEN  PREPARE RBC (CROSSMATCH)   No results found.   No diagnosis found.  O2- 99%ra, normal   Date: 08/13/2012  Rate: 81  Rhythm: normal sinus rhythm  QRS Axis: right  Intervals: normal  ST/T Wave abnormalities: normal  Conduction Disutrbances:none  Narrative Interpretation:   Old EKG Reviewed: none available Abnormal  Cardiac: 80sr, normal   Upon review hemoglobin is <5.5 MDM  This patient presents one month after coronary intervention, initiation of anticoagulant, now with fatigue, chest pain.  On exam the patient is awake alert.  With this history, there suspicion of occult bleed.  The patient was indeed Hemoccult positive, but grossly negative.  The patient's exam was otherwise largely reassuring, though he was tired in general.  Given his hemoglobin of less than 5.5, he required transfusion for symptomatic anemia.  Given initial concern GI bleed he received) depressed altered and bolus, as well as resuscitation fluids.  The  remainder of the patient's labs were largely reassuring.   He was transfused, admitted for further evaluation and management.   CRITICAL CARE Performed by: Gerhard Munch   Total critical care time: 35  Critical care time was exclusive of separately billable procedures and treating other patients.  Critical care was necessary to treat or prevent imminent or life-threatening deterioration.  Critical care was time spent personally by me on the following activities: development of treatment plan with patient and/or surrogate as well as nursing, discussions with consultants, evaluation of patient's response to treatment, examination of patient, obtaining history from patient or surrogate, ordering and performing treatments and interventions, ordering and review of laboratory studies, ordering and review of radiographic studies, pulse oximetry and re-evaluation of patient's condition.         Gerhard Munch, MD 08/13/12 Jerene Bears

## 2012-08-13 NOTE — Telephone Encounter (Signed)
Reviewed EMR - he is in ER now - preparing for transfusion -  Thanks!

## 2012-08-13 NOTE — Assessment & Plan Note (Signed)
On metformin for same since 07/2010- also ARB and statin Check a1c now and adjust prn   Lab Results  Component Value Date   HGBA1C 6.5 05/28/2012

## 2012-08-13 NOTE — Telephone Encounter (Signed)
Called pt no answer on [phone] # LMOM RTC ASAP. Also called 59 # spoke with wife gave md recommendations. She stated husband hasn't came home yet from appt will try to call him and have him to go to Er. She stated once she talk with him will call us back to let us...lmb

## 2012-08-13 NOTE — Progress Notes (Signed)
Subjective:    Patient ID: [name], male    DOB: 1946-04-11, 66 y.o.   MRN: [id number]  HPI  Here for follow up - reviewed chronic medical issues:  CAD - s/p PTCA/DES to LAD 06/28/12 - since procedure - continued intermittent "burn" sensation located in epigastric and SS chest -  Symptoms not associated with meals, position or exertion - no nausea and vomiting - taking all meds as rx'd after cath/DES  OSA - working with pulm and started CPAP for same 12/2010 - variable compliance -  DM2 - dx 2005  - on metformin since 07/2010- checks sugars 1-2x/day -the patient reports compliance with medication(s) as prescribed. Denies adverse side effects.  hypertension - on ARB since 07/2010 - denies chest pain or headache but chronic edema BLE without change-   EtOH abuse - reports cutting back on use > currently reports 1-2 beer/day - denies WD symptoms or prior DTs - no known liver complications or hx legal/employer issues with alcohol   gout - no swelling or recent trauma/injury; +hx prior flares - improved with self-tx cherry juice    Past Medical History  Diagnosis Date  . Type II or unspecified type diabetes mellitus without mention of complication, not stated as uncontrolled   . Unspecified essential hypertension   . Esophageal reflux   . Gout   . OSA (obstructive sleep apnea)     cpap started 12/2010  . Arthritis   . Umbilical hernia   . Ventral hernia   . Coronary artery disease     PTCA/DES to LAD 06/28/12    Review of Systems  Constitutional: Negative for fever and unexpected weight change.  HENT: Positive for ear pain (L ear "full"). Negative for hearing loss and ear discharge.   Respiratory: Negative for cough, shortness of breath and wheezing.   Cardiovascular: Positive for leg swelling. Negative for palpitations.       Objective:   Physical Exam  BP 130/62  Pulse 87  Temp 98.6 F (37 C) (Oral)  Ht 5\' 8"  (1.727 m)  Wt 241 lb 6.4 oz (109.498 kg)  BMI 36.70  kg/m2  SpO2 98% Wt Readings from Last 3 Encounters:  08/13/12 241 lb 6.4 oz (109.498 kg)  07/17/12 251 lb 1.9 oz (113.907 kg)  06/29/12 252 lb 13.9 oz (114.7 kg)   Constitutional:  Obese; appears well-developed and well-nourished. No distress. son at side - generalized poor hygiene Ear: L TM obscured by cerumen: after irrigation, clear without erythema or effusion Neck: Normal range of motion. Thick. Neck supple. No JVD present. No thyromegaly present.  Cardiovascular: Normal rate, regular rhythm and normal heart sounds.  No murmur heard. 1+ pitting BLE edema to below knee Pulmonary/Chest: Effort normal and breath sounds normal. No respiratory distress. no wheezes.  Abdominal: firm and distended, but bowel sounds are normal. There is no tenderness, r/g. resolved umbilical hernia, no recurrence Psychiatric: he has a normal mood and affect. behavior is normal. Judgment and thought content normal.   Lab Results  Component Value Date   WBC 4.7 06/29/2012   HGB 11.9* 06/29/2012   HCT 35.0* 06/29/2012   PLT 97* 06/29/2012   CHOL 168 05/28/2012   TRIG 198.0* 05/28/2012   HDL 29.60* 05/28/2012   ALT 19 07/24/2010   AST 30 07/24/2010   NA 136 06/29/2012   K 4.1 06/29/2012   CL 100 06/29/2012   CREATININE 0.75 06/29/2012   BUN 11 06/29/2012   CO2 26 06/29/2012   TSH  1.28 05/28/2012   INR 1.1* 06/22/2012   HGBA1C 6.5 05/28/2012   Procedure: wax removal Reason: wax impaction Risks and benefits of procedure discussed with the patient who agrees to proceed. Ear(s) irrigated with warm water. Large amount of wax removed. Instrumentation with metal ear loop was performed to accomplish wax removal. the patient tolerated procedure well.     Assessment & Plan:   L cerumen impaction - irrigation today  Epigastric pain - GI seems likely - add H2B and refer to GI -  cardiac intervention 05/2012 reviewed and follow up with cards reviewed, doubt anginal -  check labs  Also see problem list. Medications and  labs reviewed today.

## 2012-08-13 NOTE — Assessment & Plan Note (Signed)
PTCA/DES to LAD 06/28/12 - on Effient and ASA 81 Also statin and ARB Follows regularly with cards for same - Advised to follow up with them if continued epigastric symptoms despite H2B and GI eval as planned - see above

## 2012-08-13 NOTE — Progress Notes (Signed)
@   2130 patient arrived via hospital bed accompanied by emergency room RN. Patient alert and oriented times 4 and appears to be in no distress. 2600 RN assumed care.

## 2012-08-13 NOTE — Telephone Encounter (Signed)
Received called form lab critical report: Hemoglobin 5.2  Hematocrit 17.1

## 2012-08-13 NOTE — ED Notes (Signed)
Pt was sent here from PMD for low hemoglobin was around 5 today.  Pt pale with weakness and has been having upper mid abdominal tightness.  Previous cardiac stent and on blood thinner per patient

## 2012-08-13 NOTE — ED Notes (Signed)
Patient says he started having chest pain for 2 or 3 days.  Patient says he took 1 Nitro pill yesterday and the pain went away.  Patient made an appointment with the PCP for today.  Patient had been having chest pain for three days and today when he visited his PCP and he was sent to [facility] for a low HGB.

## 2012-08-13 NOTE — Patient Instructions (Signed)
It was good to see you today. We have reviewed your prior records including labs and tests today Your ears have been irrigated of wax today -let us know if continued hearing problems persist for referral to audiologist and hearing testing Start Zantac prescription for reflux symptoms - Your prescription(s) have been submitted to your pharmacy. Please take as directed and contact our office if you believe you are having problem(s) with the medication(s). Other Medications reviewed, no changes at this time. Test(s) ordered today. Your results will be released to MyChart (or called to you) after review, usually within 72hours after test completion. If any changes need to be made, you will be notified at that same time. we'll make referral to stomach specialist (GI doctor) for reflux symptoms . Our office will contact you regarding appointment(s) once made. Call cardiology if chest pain symptoms worse or unimproved - or go to ER if severe Please schedule followup in 3 months, call sooner if problems.  Gastroesophageal Reflux Disease, Adult Gastroesophageal reflux disease (GERD) happens when acid from your stomach goes into your food pipe (esophagus). The acid can cause a burning feeling in your chest. Over time, the acid can make small holes (ulcers) in your food pipe.   HOME CARE  Ask your doctor for advice about:   Losing weight.   Quitting smoking.   Alcohol use.   Avoid foods and drinks that make your problems worse. You may want to avoid:   Caffeine and alcohol.   Chocolate.   Mints.   Garlic and onions.   Spicy foods.   Citrus fruits, such as oranges, lemons, or limes.   Foods that contain tomato, such as sauce, chili, salsa, and pizza.   Fried and fatty foods.   Avoid lying down for 3 hours before you go to bed or before you take a nap.   Eat small meals often, instead of large meals.   Wear loose-fitting clothing. Do not wear anything tight around your waist.   Raise  (elevate) the head of your bed 6 to 8 inches with wood blocks. Using extra pillows does not help.   Only take medicines as told by your doctor.   Do not take aspirin or ibuprofen.  GET HELP RIGHT AWAY IF:    You have pain in your arms, neck, jaw, teeth, or back.   Your pain gets worse or changes.   You feel sick to your stomach (nauseous), throw up (vomit), or sweat (diaphoresis).   You feel short of breath, or you pass out (faint).   Your throw up is green, yellow, black, or looks like coffee grounds or blood.   Your poop (stool) is red, bloody, or black.  MAKE SURE YOU:    Understand these instructions.   Will watch your condition.   Will get help right away if you are not doing well or get worse.  Document Released: 02/01/2008 Document Revised: 11/07/2011 Document Reviewed: 03/04/2011 [facility] Patient Information 2013 [place], [place].

## 2012-08-13 NOTE — ED Notes (Signed)
Lab called with hgb of 5.6

## 2012-08-13 NOTE — Telephone Encounter (Signed)
Please call pt- he has severe anemia - he needs to go directly to hospital ER for transfusion and admission to evaluate the cause of his anemia -

## 2012-08-14 DIAGNOSIS — K921 Melena: Secondary | ICD-10-CM | POA: Diagnosis present

## 2012-08-14 LAB — CBC WITH DIFFERENTIAL/PLATELET
Basophils Absolute: 0 10*3/uL (ref 0.0–0.1)
Eosinophils Absolute: 0 10*3/uL (ref 0.0–0.7)
Eosinophils Relative: 1 % (ref 0–5)
HCT: 24.2 % — ABNORMAL LOW (ref 39.0–52.0)
Hemoglobin: 7.6 g/dL — ABNORMAL LOW (ref 13.0–17.0)
Lymphocytes Relative: 25 % (ref 12–46)
Lymphs Abs: 1 10*3/uL (ref 0.7–4.0)
MCH: 24.6 pg — ABNORMAL LOW (ref 26.0–34.0)
MCV: 78.3 fL (ref 78.0–100.0)
Monocytes Relative: 9 % (ref 3–12)
Monocytes Relative: 10 % (ref 3–12)
Platelets: 122 10*3/uL — ABNORMAL LOW (ref 150–400)
RBC: 2.5 MIL/uL — ABNORMAL LOW (ref 4.22–5.81)
RBC: 3.09 MIL/uL — ABNORMAL LOW (ref 4.22–5.81)
RDW: 16.3 % — ABNORMAL HIGH (ref 11.5–15.5)
WBC: 3.8 10*3/uL — ABNORMAL LOW (ref 4.0–10.5)

## 2012-08-14 LAB — MRSA PCR SCREENING: MRSA by PCR: NEGATIVE

## 2012-08-14 LAB — BASIC METABOLIC PANEL
BUN: 13 mg/dL (ref 6–23)
Calcium: 8.4 mg/dL (ref 8.4–10.5)
Creatinine, Ser: 0.64 mg/dL (ref 0.50–1.35)
GFR calc Af Amer: >90 mL/min (ref >90)
GFR calc non Af Amer: >90 mL/min (ref >90)
Glucose, Bld: 89 mg/dL (ref 70–99)
Potassium: 4 mEq/L (ref 3.5–5.1)

## 2012-08-14 LAB — CBC
Hemoglobin: 5.9 g/dL — CL (ref 13.0–17.0)
MCH: 23.7 pg — ABNORMAL LOW (ref 26.0–34.0)
MCHC: 30.9 g/dL (ref 30.0–36.0)
MCV: 76.7 fL — ABNORMAL LOW (ref 78.0–100.0)
Platelets: 119 10*3/uL — ABNORMAL LOW (ref 150–400)
RBC: 2.49 MIL/uL — ABNORMAL LOW (ref 4.22–5.81)

## 2012-08-14 LAB — TROPONIN I: Troponin I: <0.3 ng/mL (ref <0.30)

## 2012-08-14 LAB — GLUCOSE, CAPILLARY
Glucose-Capillary: 95 mg/dL (ref 70–99)
Glucose-Capillary: 86 mg/dL (ref 70–99)
Glucose-Capillary: 82 mg/dL (ref 70–99)

## 2012-08-14 LAB — PREPARE RBC (CROSSMATCH)

## 2012-08-14 NOTE — Progress Notes (Signed)
Set up Cpap unit for [name] and placed it on him.  He wore it for approximately 5 minutes before asking me to remove it.  He states that he has not worn his unit at home for months.

## 2012-08-14 NOTE — Progress Notes (Signed)
Pt report received from [place], [place] on 2100.  Pt to be transported with RT, RN, and NT.  IV, TC, tele.  Per Fleet Contras, RN-pt mother will be called and updated on room change.  Salomon Mast, RN

## 2012-08-14 NOTE — Consult Note (Signed)
Subjective:   HPI  The patient is a 66 year old male who was admitted to the hospital because of severe anemia and melena. He had a coronary stent placed in October 2013 and after that was put on a baby aspirin a day and Effient. He tells me that since the early part of November he has been having black colored stools. They stopped for a while but then resumed again. He is also been having some epigastric abdominal pain for the past week or so. He denies vomiting or hematemesis.  He has never had a colonoscopy. He states that he was going to get set up for a colonoscopy however he started having problems with his heart and therefore that had to be postponed.  Review of Systems . No chest pain or shortness of breath  Past Medical History  Diagnosis Date  . Type II or unspecified type diabetes mellitus without mention of complication, not stated as uncontrolled   . Unspecified essential hypertension   . Esophageal reflux   . Gout   . OSA (obstructive sleep apnea)     cpap started 12/2010  . Arthritis   . Umbilical hernia   . Ventral hernia   . Coronary artery disease     PTCA/DES to LAD 06/28/12   Past Surgical History  Procedure Date  . Appendectomy 04/2004  . Colon surgery   . Leg surgery   . Kidney stone surgery   . Ankle surgery   . Hernia repair 06/06/11    lap repair of incarcerated umbilical hernia with mesh  . Coronary angioplasty with stent placement 06/28/2012    DES   to LAD   History   Social History  . Marital Status: Married    Spouse Name: N/A    Number of Children: N/A  . Years of Education: N/A   Occupational History  . Not on file.   Social History Main Topics  . Smoking status: Former Smoker -- 2.0 packs/day for 40 years    Types: Cigarettes    Quit date: 08/29/1996  . Smokeless tobacco: Not on file  . Alcohol Use: Yes     Comment: 2 BEERS A DAY  . Drug Use: No  . Sexually Active: Not on file   Other Topics Concern  . Not on file   Social  History Narrative  . No narrative on file   family history includes Cancer in his brother and sisters; Colon cancer in an unspecified family member; Coronary artery disease in an unspecified family member; Diabetes in an unspecified family member; and Lung cancer in his sister. Current facility-administered medications:0.9 %  sodium chloride infusion, 250 mL, Intravenous, PRN, Tarry Kos, MD;  insulin aspart (novoLOG) injection 0-9 Units, 0-9 Units, Subcutaneous, Q4H, Tarry Kos, MD;  morphine 2 MG/ML injection 1 mg, 1 mg, Intravenous, Q4H PRN, Tarry Kos, MD, 1 mg at 08/14/12 0202;  ondansetron (ZOFRAN) injection 4 mg, 4 mg, Intravenous, Q6H PRN, Tarry Kos, MD ondansetron [facility]) tablet 4 mg, 4 mg, Oral, Q6H PRN, Tarry Kos, MD;  pantoprazole (PROTONIX) 80 mg in sodium chloride 0.9 % 250 mL infusion, 8 mg/hr, Intravenous, Continuous, Gerhard Munch, MD, Last Rate: 25 mL/hr at 08/14/12 1400, 8 mg/hr at 08/14/12 1400;  sodium chloride 0.9 % injection 3 mL, 3 mL, Intravenous, Q12H, Tarry Kos, MD, 3 mL at 08/14/12 0933;  sodium chloride 0.9 % injection 3 mL, 3 mL, Intravenous, PRN, Tarry Kos, MD No Known Allergies   Objective:     BP 122/57  Pulse 79  Temp 97.9 F (36.6 C) (Oral)  Resp 19  Ht 5\' 8"  (1.727 m)  Wt 107.7 kg (237 lb 7 oz)  BMI 36.10 kg/m2  SpO2 100%  Alert and oriented  No acute distress  Heart regular rhythm no murmurs  Lungs clear  Abdomen: Bowel sounds normal, soft, nontender  Laboratory No components found with this basename: d1      Assessment:     Melena  Anemia      Plan:     Transfuse blood as necessary. PPI therapy. Proceed with EGD to evaluate upper GI tract. Lab Results  Component Value Date   HGB 5.8* 08/14/2012   HGB 5.9* 08/14/2012   HGB 5.6* 08/13/2012   HCT 19.1* 08/14/2012   HCT 19.1* 08/14/2012   HCT 18.7* 08/13/2012   ALKPHOS 106 08/13/2012   ALKPHOS 91 08/13/2012   ALKPHOS 96 07/24/2010   AST 20 08/13/2012    AST 23 08/13/2012   AST 30 07/24/2010   ALT 14 08/13/2012   ALT 18 08/13/2012   ALT 19 07/24/2010

## 2012-08-14 NOTE — Progress Notes (Signed)
[facility] Progress Note [place] TEAM 1 - Stepdown/ICU TEAM   [name] UJW:[id number] DOB: Dec 12, 1945 DOA: 08/13/2012 PCP: Rene Paci, MD  Brief narrative: 66 year old male patient status post stent to LAD October 2013 therefore on chronic aspirin effient. Presents with one week of increasing epigastric abdominal pain without nausea vomiting but noted to have dark stools times one week. Has not noticed any bright red blood. Also has been noted to have generalized weakness. Previous records reveal one month prior to this admission patient's hemoglobin was 11 at presentation was down to 5. Patient denies any syncope or near syncopal episodes. Per the admitting physician's note gastroenterology has been consulted and the patient was admitted to the step down unit.  Assessment/Plan: Principal Problem:  *Acute blood loss anemia *Has received 2 units of packed red blood cells since admission and repeat hemoglobin still quite low at 5.87 additional 2 units have been ordered with plans to repeat a CBC 2 hours after this transfusion is completed *Remains hemodynamically stable although blood pressure for him is quite soft  Active Problems:  Abdominal pain, acute, epigastric/ Melena *Suspect upper GI bleeding in relation to recent need for antiplatelet medications-these currently are on hold *Patient currently denying any abdominal pain today *Continue Protonix infusion *Await gastroenterology evaluation for likely EEG D. this admission   DIABETES MELLITUS, TYPE II-controlled *Continue sliding scale insulin *On Glucophage at home   GERD *Was on Zantac at home   Coronary artery disease s/p stent LAD 10/13 *Since n.p.o. home Lipitor as well as antiplatelet medications are on hold-according to medication reconciliation is not on beta blockers prior to admission *Pending results of GI bleed/anemia workup may need to consult cardiology in the event antiplatelet drugs based on  these findings   HYPERTENSION *Given soft blood pressure in setting of anemia Lasix and Cozaar remain on hold *When not stressed patient's global systolic function was normal based on dobutamine echocardiogram October 2013   OBSTRUCTIVE SLEEP APNEA *Is supposed to be utilizing CPAP at home but when offered here patient finally admitted that hasn't used for months at home and more for less than 30 minutes last night   Hyperlipidemia LDL goal < 70 *Statin drug on hold while n.p.o.   DVT prophylaxis: SCDs Code Status: Full Family Communication: Spoke with patient and his family Disposition Plan: Remain in step down  Consultants: Gastroenterology-formally consulted Dr. Evette Cristal at 2:25 PM on 08/14/2012  Procedures: None  Antibiotics: None  HPI/Subjective: Patient currently denies chest pain and states in addition to the epigastric pain he was also having occasional left upper quadrant abdominal pain. Currently has not had any further melanotic stool since arrival and has had no bloody stools or emesis. Denies lightheadedness or weakness at rest.   Objective: Blood pressure 118/64, pulse 73, temperature 98 F (36.7 C), temperature source Oral, resp. rate 14, height 5\' 8"  (1.727 m), weight 107.7 kg (237 lb 7 oz), SpO2 100.00%.  Intake/Output Summary (Last 24 hours) at 08/14/12 1205 Last data filed at 08/14/12 1115  Gross per 24 hour  Intake 2305.5 ml  Output    900 ml  Net 1405.5 ml     Exam: General: No acute respiratory distress Lungs: Clear to auscultation bilaterally without wheezes or crackles Cardiovascular: Regular rate and rhythm without murmur gallop or rub normal S1 and S2, chronic soft nonpitting bilateral lower extremity edema, no JVD Abdomen: Nontender, nondistended, soft, bowel sounds positive, no rebound, no ascites, no appreciable mass Musculoskeletal: No significant cyanosis, clubbing  of bilateral lower extremities Neurological: Patient is alert and oriented  x3, moves all extremities x4, exam is otherwise non-focal  Data Reviewed: Basic Metabolic Panel:  Lab 08/14/12 1610 08/13/12 1634 08/13/12 1442  NA 140 136 136  K 4.0 4.6 4.2  CL 107 102 102  CO2 23 23 26   GLUCOSE 89 118* 118*  BUN 13 17 18   CREATININE 0.64 0.78 0.8  CALCIUM 8.4 9.0 8.6  MG -- -- --  PHOS -- -- --   Liver Function Tests:  Lab 08/13/12 1634 08/13/12 1442  AST 20 23  ALT 14 18  ALKPHOS 106 91  BILITOT 0.4 0.8  PROT 6.9 6.8  ALBUMIN 3.7 3.7   No results found for this basename: LIPASE:5,AMYLASE:5 in the last 168 hours No results found for this basename: AMMONIA:5 in the last 168 hours CBC:  Lab 08/14/12 0823 08/14/12 0550 08/13/12 1634 08/13/12 1442  WBC 3.8* 4.0 4.9 4.8  NEUTROABS 2.5 -- 3.3 3.2  HGB 5.8* 5.9* 5.6* 5.2 Repeated and verified X2.*  HCT 19.1* 19.1* 18.7* 17.1 Repeated and verified X2.*  MCV 76.4* 76.7* 77.0* 75.2*  PLT 122* 119* 152 148.0*   Cardiac Enzymes:  Lab 08/14/12 1049 08/14/12 0550  CKTOTAL -- --  CKMB -- --  CKMBINDEX -- --  TROPONINI <0.30 <0.30   BNP (last 3 results) No results found for this basename: PROBNP:3 in the last 8760 hours CBG:  Lab 08/14/12 0824 08/14/12 0401 08/14/12 0002  GLUCAP 86 95 93    Recent Results (from the past 240 hour(s))  MRSA PCR SCREENING     Status: Normal   Collection Time   08/13/12 10:45 PM      Component Value Range Status Comment   MRSA by PCR NEGATIVE  NEGATIVE Final      Studies:  Recent x-ray studies have been reviewed in detail by the Attending Physician  Scheduled Meds:  Reviewed in detail by the Attending Physician   Junious Silk, ANP [facility] Office  [phone] Pager [phone]  On-Call/Text Page:      [email or web address]      password [email or web address]  If 7PM-7AM, please contact night-coverage [email or web address] Password [email or web address] 08/14/2012, 12:05 PM   LOS: 1 day   I have examined the patient, reviewed the chart and modified the above note which I agree with.    Calvert Cantor, MD [phone]

## 2012-08-14 NOTE — Progress Notes (Signed)
Patient is not wearing at this time

## 2012-08-15 ENCOUNTER — Encounter (HOSPITAL_COMMUNITY)
Admission: EM | Disposition: A | Discharge status: Home / Self Care | Payer: Self-pay | Source: Home / Self Care | Attending: Internal Medicine

## 2012-08-15 ENCOUNTER — Encounter (HOSPITAL_COMMUNITY): Payer: Self-pay

## 2012-08-15 HISTORY — PX: ESOPHAGOGASTRODUODENOSCOPY: SHX5428

## 2012-08-15 LAB — GLUCOSE, CAPILLARY
Glucose-Capillary: 149 mg/dL — ABNORMAL HIGH (ref 70–99)
Glucose-Capillary: 161 mg/dL — ABNORMAL HIGH (ref 70–99)
Glucose-Capillary: 76 mg/dL (ref 70–99)
Glucose-Capillary: 78 mg/dL (ref 70–99)

## 2012-08-15 SURGERY — EGD (ESOPHAGOGASTRODUODENOSCOPY)
Anesthesia: Moderate Sedation

## 2012-08-15 MED ORDER — FENTANYL CITRATE 0.05 MG/ML IJ SOLN
INTRAMUSCULAR | Status: AC
  Filled 2012-08-15: qty 2

## 2012-08-15 MED ORDER — SODIUM CHLORIDE 0.9 % IV SOLN: INTRAVENOUS | Status: DC

## 2012-08-15 MED ORDER — SODIUM CHLORIDE 0.9 % IV SOLN
INTRAVENOUS | Status: DC
  Administered 2012-08-15: 1000 mL via INTRAVENOUS

## 2012-08-15 MED ORDER — ASPIRIN EC 81 MG PO TBEC
81.0000 mg | DELAYED_RELEASE_TABLET | Freq: Every day | ORAL | Status: DC
  Administered 2012-08-16 – 2012-08-17 (×2): 81 mg via ORAL
  Filled 2012-08-15 (×3): qty 1

## 2012-08-15 MED ORDER — MIDAZOLAM HCL 10 MG/2ML IJ SOLN
INTRAMUSCULAR | Status: DC | PRN
  Administered 2012-08-15: 1 mg via INTRAVENOUS
  Administered 2012-08-15 (×3): 2 mg via INTRAVENOUS

## 2012-08-15 MED ORDER — ASPIRIN EC 81 MG PO TBEC
81.0000 mg | DELAYED_RELEASE_TABLET | Freq: Every day | ORAL | Status: DC
  Filled 2012-08-15: qty 1

## 2012-08-15 MED ORDER — ASPIRIN 81 MG PO CHEW
CHEWABLE_TABLET | ORAL | Status: AC
  Administered 2012-08-15: 81 mg
  Filled 2012-08-15: qty 1

## 2012-08-15 MED ORDER — MORPHINE SULFATE 2 MG/ML IJ SOLN: 1.0000 mg | INTRAMUSCULAR | Status: DC | PRN

## 2012-08-15 MED ORDER — SUCRALFATE 1 GM/10ML PO SUSP
1.0000 g | Freq: Three times a day (TID) | ORAL | Status: AC
  Administered 2012-08-15 – 2012-08-16 (×5): 1 g via ORAL
  Filled 2012-08-15 (×5): qty 10

## 2012-08-15 MED ORDER — OXYCODONE HCL 5 MG PO TABS: 5.0000 mg | ORAL_TABLET | ORAL | Status: DC | PRN

## 2012-08-15 MED ORDER — DIPHENHYDRAMINE HCL 50 MG/ML IJ SOLN
INTRAMUSCULAR | Status: AC
  Filled 2012-08-15: qty 1

## 2012-08-15 MED ORDER — MIDAZOLAM HCL 5 MG/ML IJ SOLN
INTRAMUSCULAR | Status: AC
  Filled 2012-08-15: qty 3

## 2012-08-15 MED ORDER — DIPHENHYDRAMINE HCL 50 MG/ML IJ SOLN
INTRAMUSCULAR | Status: DC | PRN
  Administered 2012-08-15: 25 mg via INTRAVENOUS

## 2012-08-15 MED ORDER — PRASUGREL HCL 10 MG PO TABS
10.0000 mg | ORAL_TABLET | Freq: Every day | ORAL | Status: DC
  Administered 2012-08-15 – 2012-08-17 (×3): 10 mg via ORAL
  Filled 2012-08-15 (×3): qty 1

## 2012-08-15 MED ORDER — BUTAMBEN-TETRACAINE-BENZOCAINE 2-2-14 % EX AERO
INHALATION_SPRAY | CUTANEOUS | Status: DC | PRN
  Administered 2012-08-15: 2 via TOPICAL

## 2012-08-15 MED ORDER — FENTANYL CITRATE 0.05 MG/ML IJ SOLN
INTRAMUSCULAR | Status: DC | PRN
  Administered 2012-08-15 (×4): 25 ug via INTRAVENOUS

## 2012-08-15 MED ORDER — ATORVASTATIN CALCIUM 80 MG PO TABS
80.0000 mg | ORAL_TABLET | Freq: Every day | ORAL | Status: DC
  Administered 2012-08-15 – 2012-08-16 (×2): 80 mg via ORAL
  Filled 2012-08-15 (×3): qty 1

## 2012-08-15 MED ORDER — PANTOPRAZOLE SODIUM 40 MG PO TBEC
40.0000 mg | DELAYED_RELEASE_TABLET | Freq: Every day | ORAL | Status: DC
  Administered 2012-08-15 – 2012-08-16 (×2): 40 mg via ORAL
  Filled 2012-08-15 (×2): qty 1

## 2012-08-15 NOTE — Op Note (Signed)
[facility] [address] [place], [address]   ENDOSCOPY PROCEDURE REPORT  PATIENT: [name], [name]  MR#: [id number] BIRTHDATE: [birth year] , 66  yrs. old GENDER: Male ENDOSCOPIST: Wandalee Ferdinand, MD REFERRED BY: PROCEDURE DATE:  08/15/2012 PROCEDURE:   EGD ASA CLASS: 3 INDICATIONS: melena, anemia MEDICATIONS: fentanyl 100 mcg IV, Versed 7 mg IV, Benadryl 25 mg IV TOPICAL ANESTHETIC: Cetacaine spray to the oropharynx  DESCRIPTION OF PROCEDURE:   After the risks benefits and alternatives of the procedure were thoroughly explained, informed consent was obtained.  The Pentax Gastroscope [id number]  endoscope was introduced through the mouth and advanced to the second portion of the duodenum      , limited by Without limitations.   The instrument was slowly withdrawn as the mucosa was fully examined.      FINDINGS:  Esophagus: Normal  Stomach: Several small antral ulcers, and erosions. No evidence of active bleeding.  Duodenum: Normal  COMPLICATIONS:none  ENDOSCOPIC IMPRESSION: several small antral ulcers and erosions. No evidence of active bleeding.   RECOMMENDATIONS:continue PPI therapy going forward to help heal the gastric ulcers and erosions and also to protect the gastric mucosa in the future. Since he needs to remain on antiplatelet therapy given his recent coronary stent he should remain on PPI therapy.  at discharge he should be placed on iron supplementation to help build up his hemoglobin and hematocrit.  We will sign off. Call us again if needed.   _______________________________ Rosalie DoctorWandalee Ferdinand, MD 08/15/2012 11:36 AM

## 2012-08-15 NOTE — Progress Notes (Signed)
Received pt back from Endo, alert and oriented with a report of small ulcers with erosion to antrum gastric area.  Patient denies of pain and complaint of hunger voiced.

## 2012-08-15 NOTE — Progress Notes (Signed)
[facility] Progress Note [place] TEAM 1 - Stepdown/ICU TEAM   [name] ZHY:[id number] DOB: [birth year] DOA: 08/13/2012 PCP: Rene Paci, MD  Brief narrative: 67 year old male patient status post stent DES to LAD October 2013 therefore on aspirin + effient. Presents with one week of increasing epigastric abdominal pain without nausea vomiting but noted to have dark stools times one week. Has not noticed any bright red blood. Also has been noted to have generalized weakness. Previous records reveal one month prior to this admission patient's hemoglobin was 11 - at presentation was down to 5. Patient denied any syncope or near syncopal episodes.   Assessment/Plan:  Acute blood loss anemia S/p 4U PRBC thus far - hemodynamically stable - goal HGB is 8.0 or > - transfuse another unit today and cont to follow trend - will need to watch Hgb for ~48hrs after restart of ASA + Effient  Acute upper GI bleeding w/ abdom pain EGD revealed several small antral ulcers, and erosions w/ no apparent active bleed - cont PPI but stop gtt - add short trial of carafate - advance diet as able   DIABETES MELLITUS, TYPE II-controlled Continue sliding scale insulin - well controlled at present  GERD Cont PPI chronically   Coronary artery disease s/p DES stent LAD 10/13 Resume asa + Effient as they are both strongly indicated - watch for re-bleeding while in hospital - according to medication reconciliation was not on beta blockers prior to admission  HYPERTENSION Given soft blood pressure in setting of anemia Lasix and Cozaar remain on hold  OBSTRUCTIVE SLEEP APNEA supposed to be utilizing CPAP at home but when offered here patient finally admitted that hasn't used for months at home  Hyperlipidemia LDL goal < 70 Resume lipitor  DVT prophylaxis: SCDs Code Status: Full Family Communication: Spoke with patient and his wife Disposition Plan: transfer to medical  bed  Consultants: Gastroenterology Deboraha Sprang  Procedures: 12/18 - EGD - Several small antral ulcers, and erosions. No evidence of active bleeding.  Antibiotics: None  HPI/Subjective: Patient is resting comfortably post EGD.  He is very hungry.  He denies chest pain, further epigastric pain,  nausea or vomiting.   Objective: Blood pressure 115/53, pulse 61, temperature 98.3 F (36.8 C), temperature source Oral, resp. rate 12, height 5\' 8"  (1.727 m), weight 112.6 kg (248 lb 3.8 oz), SpO2 100.00%.  Intake/Output Summary (Last 24 hours) at 08/15/12 1355 Last data filed at 08/15/12 1200  Gross per 24 hour  Intake 1020.5 ml  Output    575 ml  Net  445.5 ml     Exam: General: No acute respiratory distress Lungs: Clear to auscultation bilaterally without wheezes or crackles Cardiovascular: Regular rate and rhythm without murmur gallop or rub normal  Abdomen: Nontender, nondistended, soft, bowel sounds positive, no rebound, no ascites, no appreciable mass Musculoskeletal: No significant cyanosis, clubbing of bilateral lower extremities Neurological: Patient is alert and oriented x3, moves all extremities x4, exam is otherwise non-focal  Data Reviewed: Basic Metabolic Panel:  Lab 08/14/12 2952 08/13/12 1634 08/13/12 1442  NA 140 136 136  K 4.0 4.6 4.2  CL 107 102 102  CO2 23 23 26   GLUCOSE 89 118* 118*  BUN 13 17 18   CREATININE 0.64 0.78 0.8  CALCIUM 8.4 9.0 8.6  MG -- -- --  PHOS -- -- --   Liver Function Tests:  Lab 08/13/12 1634 08/13/12 1442  AST 20 23  ALT 14 18  ALKPHOS 106 91  BILITOT 0.4 0.8  PROT 6.9 6.8  ALBUMIN 3.7 3.7   CBC:  Lab 08/15/12 0542 08/14/12 2135 08/14/12 0823 08/14/12 0550 08/13/12 1634 08/13/12 1442  WBC -- 4.3 3.8* 4.0 4.9 4.8  NEUTROABS -- 2.9 2.5 -- 3.3 3.2  HGB 7.5* 7.6* 5.8* 5.9* 5.6* --  HCT 24.3* 24.2* 19.1* 19.1* 18.7* --  MCV -- 78.3 76.4* 76.7* 77.0* 75.2*  PLT -- 130* 122* 119* 152 148.0*   Cardiac Enzymes:  Lab 08/14/12  1049 08/14/12 0550  CKTOTAL -- --  CKMB -- --  CKMBINDEX -- --  TROPONINI <0.30 <0.30   CBG:  Lab 08/15/12 1311 08/15/12 0744 08/15/12 0423 08/15/12 0003 08/14/12 2009  GLUCAP 81 78 76 84 74    Recent Results (from the past 240 hour(s))  MRSA PCR SCREENING     Status: Normal   Collection Time   08/13/12 10:45 PM      Component Value Range Status Comment   MRSA by PCR NEGATIVE  NEGATIVE Final      Studies:  Recent x-ray studies have been reviewed in detail by the Attending Physician  Scheduled Meds:  Reviewed in detail by the Attending Physician   Lonia Blood, MD [facility] Office  [phone] Pager [phone]  On-Call/Text Page:      [email or web address]      password [facility]  08/15/2012, 1:55 PM   LOS: 2 days

## 2012-08-16 ENCOUNTER — Encounter (HOSPITAL_COMMUNITY): Payer: Self-pay | Admitting: Gastroenterology

## 2012-08-16 LAB — TYPE AND SCREEN
Antibody Screen: NEGATIVE
Unit division: 0
Unit division: 0

## 2012-08-16 LAB — GLUCOSE, CAPILLARY
Glucose-Capillary: 105 mg/dL — ABNORMAL HIGH (ref 70–99)
Glucose-Capillary: 148 mg/dL — ABNORMAL HIGH (ref 70–99)
Glucose-Capillary: 113 mg/dL — ABNORMAL HIGH (ref 70–99)
Glucose-Capillary: 114 mg/dL — ABNORMAL HIGH (ref 70–99)

## 2012-08-16 LAB — BASIC METABOLIC PANEL
BUN: 11 mg/dL (ref 6–23)
CO2: 25 mEq/L (ref 19–32)
Calcium: 9 mg/dL (ref 8.4–10.5)
Glucose, Bld: 109 mg/dL — ABNORMAL HIGH (ref 70–99)
Sodium: 137 mEq/L (ref 135–145)

## 2012-08-16 LAB — CBC
HCT: 27.6 % — ABNORMAL LOW (ref 39.0–52.0)
Hemoglobin: 8.7 g/dL — ABNORMAL LOW (ref 13.0–17.0)
MCH: 24.7 pg — ABNORMAL LOW (ref 26.0–34.0)
MCV: 78.4 fL (ref 78.0–100.0)
RBC: 3.52 MIL/uL — ABNORMAL LOW (ref 4.22–5.81)

## 2012-08-16 LAB — HEMOGLOBIN AND HEMATOCRIT, BLOOD
HCT: 26.7 % — ABNORMAL LOW (ref 39.0–52.0)
Hemoglobin: 8.5 g/dL — ABNORMAL LOW (ref 13.0–17.0)

## 2012-08-16 NOTE — Progress Notes (Signed)
Pt had been on cpap on another floor.  cpap was wrapped up in plastic bag when i went in the room. Pt's wife had taken circuit and mask home.  Had to bring new circuit and mask.

## 2012-08-16 NOTE — Progress Notes (Signed)
[facility]                                                                                Patient Demographics  [name], is a 66 y.o. male  ZOX:[id number]  WJX:[id number]  DOB - 1945/11/27  Admit date - 08/13/2012  Admitting Physician Tarry Kos, MD  Outpatient Primary MD for the patient is Rene Paci, MD  LOS - 3   Chief Complaint  Patient presents with  . Weakness  . Chest Pain        Assessment & Plan    Brief narrative:  66 year old male patient status post stent DES to LAD October 2013 therefore on aspirin + effient. Presents with one week of increasing epigastric abdominal pain without nausea vomiting but noted to have dark stools times one week. Has not noticed any bright red blood. Also has been noted to have generalized weakness. Previous records reveal one month prior to this admission patient's hemoglobin was 11 - at presentation was down to 5. Patient denied any syncope or near syncopal episodes.  Assessment/Plan:     Acute blood loss anemia  S/p 4U PRBC thus far - hemodynamically stable - goal HGB is 8.0 or > - transfuse another unit today and cont to follow trend - will need to watch Hgb for ~48hrs after restart of ASA + Effient ( 1 more day)    Acute upper GI bleeding w/ abdom pain  EGD revealed several small antral ulcers, and erosions w/ no apparent active bleed - cont PPI but stop gtt - add short trial of carafate - advance diet as able     DIABETES MELLITUS, TYPE II-controlled  Continue sliding scale insulin - well controlled at present   CBG (last 3)   Basename 08/16/12 0740 08/16/12 0517 08/15/12 2337  GLUCAP 148* 105* 149*      GERD  Cont PPI chronically    Coronary artery disease s/p DES stent LAD 10/13  Resume asa + Effient as they are both strongly indicated - watch for re-bleeding while in hospital - according to medication reconciliation was not on beta blockers prior to admission    HYPERTENSION   Given soft blood pressure in setting of anemia Lasix and Cozaar remain on hold    OBSTRUCTIVE SLEEP APNEA  supposed to be utilizing CPAP at home but when offered here patient finally admitted that hasn't used for months at home    Hyperlipidemia LDL goal < 70    Code Status: Full   Family Communication: Spoke with patient and his wife bedside 08/16/2012  Disposition Plan: Home  Consultants:  Gastroenterology - Deboraha Sprang   Procedures:  12/18 - EGD - Several small antral ulcers, and erosions. No evidence of active bleeding.   Antibiotics:  None     DVT Prophylaxis  SCDs    Lab Results  Component Value Date   PLT 144* 08/16/2012    Medications  Scheduled Meds:   . aspirin EC  81 mg Oral Daily  . atorvastatin  80 mg Oral q1800  . insulin aspart  0-9 Units Subcutaneous Q4H  . pantoprazole  40 mg Oral Q1200  . prasugrel  10 mg Oral  Daily  . sucralfate  1 g Oral TID WC & HS   Continuous Infusions:  PRN Meds:.morphine injection, ondansetron (ZOFRAN) IV, ondansetron, oxyCODONE  Antibiotics    Anti-infectives    None       Time Spent in minutes   35   Alycia Cooperwood K M.D on 08/16/2012 at 10:51 AM  Between 7am to 7pm - Pager - [phone]  After 7pm go to [email or web address] - password [email or web address]  And look for the night coverage person covering for me after hours  [facility] Office  [phone]    Subjective:   [name] today has, No headache, No chest pain, No abdominal pain - No Nausea, No new weakness tingling or numbness, No Cough - SOB.    Objective:   Filed Vitals:   08/15/12 1800 08/15/12 1825 08/15/12 2120 08/16/12 0520  BP: 124/64 126/64 121/58 109/62  Pulse: 70 66 73 72  Temp: 98.7 F (37.1 C) 98.7 F (37.1 C) 98 F (36.7 C) 98.6 F (37 C)  TempSrc: Oral Oral Oral Oral  Resp: 17 18 18 18   Height:  5\' 8"  (1.727 m)    Weight:  112.6 kg (248 lb 3.8 oz)    SpO2:  100% 99% 97%    Wt Readings from Last 3 Encounters:   08/15/12 112.6 kg (248 lb 3.8 oz)  08/15/12 112.6 kg (248 lb 3.8 oz)  08/13/12 109.498 kg (241 lb 6.4 oz)     Intake/Output Summary (Last 24 hours) at 08/16/12 1051 Last data filed at 08/15/12 1825  Gross per 24 hour  Intake 663.33 ml  Output      0 ml  Net 663.33 ml    Exam Awake Alert, Oriented X 3, No new F.N deficits, Normal affect [place].AT,PERRAL Supple Neck,No JVD, No cervical lymphadenopathy appriciated.  Symmetrical Chest wall movement, Good air movement bilaterally, CTAB RRR,No Gallops,Rubs or new Murmurs, No Parasternal Heave +ve B.Sounds, Abd Soft, Non tender, No organomegaly appriciated, No rebound - guarding or rigidity. No Cyanosis, Clubbing or edema, No new Rash or bruise     Data Review   Micro Results Recent Results (from the past 240 hour(s))  MRSA PCR SCREENING     Status: Normal   Collection Time   08/13/12 10:45 PM      Component Value Range Status Comment   MRSA by PCR NEGATIVE  NEGATIVE Final     Radiology Reports No results found.  CBC  Lab 08/16/12 0545 08/15/12 1329 08/15/12 0542 08/14/12 2135 08/14/12 0823 08/14/12 0550 08/13/12 1634 08/13/12 1442  WBC 4.8 -- -- 4.3 3.8* 4.0 4.9 --  HGB 8.7* 8.2* 7.5* 7.6* 5.8* -- -- --  HCT 27.6* 26.8* 24.3* 24.2* 19.1* -- -- --  PLT 144* -- -- 130* 122* 119* 152 --  MCV 78.4 -- -- 78.3 76.4* 76.7* 77.0* --  MCH 24.7* -- -- 24.6* 23.2* 23.7* 23.0* --  MCHC 31.5 -- -- 31.4 30.4 30.9 29.9* --  RDW 16.6* -- -- 16.5* 16.3* 16.2* 16.7* --  LYMPHSABS -- -- -- 1.0 1.0 -- 1.1 1.0  MONOABS -- -- -- 0.4 0.3 -- 0.5 0.5  EOSABS -- -- -- 0.0 0.0 -- 0.1 0.1  BASOSABS -- -- -- 0.0 0.0 -- 0.0 0.0  BANDABS -- -- -- -- -- -- -- --    Chemistries   Lab 08/16/12 0545 08/14/12 0550 08/13/12 1634 08/13/12 1442  NA 137 140 136 136  K 4.1 4.0 4.6 4.2  CL 102 107 102 102  CO2 25 23 23 26   GLUCOSE 109* 89 118* 118*  BUN 11 13 17 18   CREATININE 0.80 0.64 0.78 0.8  CALCIUM 9.0 8.4 9.0 8.6  MG -- -- -- --  AST -- --  20 23  ALT -- -- 14 18  ALKPHOS -- -- 106 91  BILITOT -- -- 0.4 0.8   ------------------------------------------------------------------------------------------------------------------ estimated creatinine clearance is 110.6 ml/min (by C-G formula based on Cr of 0.8). ------------------------------------------------------------------------------------------------------------------  [facility] 08/13/12 1442  HGBA1C 5.7   ------------------------------------------------------------------------------------------------------------------ No results found for this basename: CHOL:2,HDL:2,LDLCALC:2,TRIG:2,CHOLHDL:2,LDLDIRECT:2 in the last 72 hours ------------------------------------------------------------------------------------------------------------------ No results found for this basename: TSH,T4TOTAL,FREET3,T3FREE,THYROIDAB in the last 72 hours ------------------------------------------------------------------------------------------------------------------ No results found for this basename: VITAMINB12:2,FOLATE:2,FERRITIN:2,TIBC:2,IRON:2,RETICCTPCT:2 in the last 72 hours  Coagulation profile  Lab 08/14/12 0550  INR 1.30  PROTIME --    No results found for this basename: DDIMER:2 in the last 72 hours  Cardiac Enzymes  Lab 08/14/12 1049 08/14/12 0550  CKMB -- --  TROPONINI <0.30 <0.30  MYOGLOBIN -- --   ------------------------------------------------------------------------------------------------------------------ No components found with this basename: POCBNP:3

## 2012-08-16 NOTE — Progress Notes (Signed)
Pt up and about in room today, no compliants voiced family visiting.  Hoping to go home in am.

## 2012-08-17 DIAGNOSIS — R609 Edema, unspecified: Secondary | ICD-10-CM

## 2012-08-17 LAB — GLUCOSE, CAPILLARY
Glucose-Capillary: 115 mg/dL — ABNORMAL HIGH (ref 70–99)
Glucose-Capillary: 118 mg/dL — ABNORMAL HIGH (ref 70–99)

## 2012-08-17 MED ORDER — PANTOPRAZOLE SODIUM 40 MG PO TBEC: 40.0000 mg | DELAYED_RELEASE_TABLET | Freq: Two times a day (BID) | ORAL | Status: DC

## 2012-08-17 NOTE — Discharge Summary (Signed)
[facility]                                                                                   [name], is a 66 y.o. male  DOB 06-06-1946  MRN [id number].  Admission date:  08/13/2012  Discharge Date:  08/17/2012  Primary MD  Rene Paci, MD  Admitting Physician  Tarry Kos, MD  Admission Diagnosis  Type II or unspecified type diabetes mellitus without mention of complication, not stated as uncontrolled [250.00] Unspecified essential hypertension [401.9] Esophageal reflux [530.81] Coronary artery disease [414.00] Acute blood loss anemia [285.1] Epigastric pain [789.06] GI bleed [578.9] Abdominal pain, acute, epigastric [789.06, 338.19] low hemoglobin  Discharge Diagnosis     Principal Problem:  *Acute blood loss anemia Active Problems:  DIABETES MELLITUS, TYPE II-controlled  HYPERTENSION  GERD  OBSTRUCTIVE SLEEP APNEA  Coronary artery disease s/p stent LAD 10/13  Hyperlipidemia LDL goal < 70  Abdominal pain, acute, epigastric  Melena    Past Medical History  Diagnosis Date  . Type II or unspecified type diabetes mellitus without mention of complication, not stated as uncontrolled   . Unspecified essential hypertension   . Esophageal reflux   . Gout   . OSA (obstructive sleep apnea)     cpap started 12/2010  . Arthritis   . Umbilical hernia   . Ventral hernia   . Coronary artery disease     PTCA/DES to LAD 06/28/12    Past Surgical History  Procedure Date  . Appendectomy 04/2004  . Colon surgery   . Leg surgery   . Kidney stone surgery   . Ankle surgery   . Hernia repair 06/06/11    lap repair of incarcerated umbilical hernia with mesh  . Coronary angioplasty with stent placement 06/28/2012    DES   to LAD  . Esophagogastroduodenoscopy 08/15/2012    Procedure: ESOPHAGOGASTRODUODENOSCOPY (EGD);  Surgeon: Graylin Shiver, MD;  Location: [facility] ENDOSCOPY;  Service: Endoscopy;  Laterality: N/A;     Recommendations for primary care  physician for things to follow:   1 it her H&H closely.   Discharge Diagnoses:   Principal Problem:  *Acute blood loss anemia Active Problems:  DIABETES MELLITUS, TYPE II-controlled  HYPERTENSION  GERD  OBSTRUCTIVE SLEEP APNEA  Coronary artery disease s/p stent LAD 10/13  Hyperlipidemia LDL goal < 70  Abdominal pain, acute, epigastric  Melena    Discharge Condition: sTable   Diet recommendation: See Discharge Instructions below   Consults [facility]   History of present illness and  Hospital Course:  See H&P, Labs, Consult and Test reports for all details in brief, patient was admitted for weakness caused by upper GI bleed caused severe anemia requiring transfusions, patient has history of CAD and had received drug-eluting stent to LAD in October of 2013, thereafter he was placed on aspirin along with Effient, he was seen by GI physician and underwent an EGD revealing several small antral ulcers and erosions with no apparent active bleed, he was initially placed on IV PPI and his aspirin and antiplatelet medications were held, thereafter his H&H stabilized, he has been transitioned to oral PPI H&H has remained  stable, his antiplatelet medications have been resumed due to recent DES stent. He is now symptom-free and eager to go home. We'll follow with his primary care physician, cardiologist and [facility] closely.    For his diabetes mellitus type 2, hypertension, obstructive sleep apnea and dyslipidemia home medications will be continued, he has been counseled to be compliant with his CPAP.   Today   Subjective:   [name] today has no headache,no chest abdominal pain,no new weakness tingling or numbness, feels much better wants to go home today.   Objective:   Blood pressure 128/59, pulse 63, temperature 98.1 F (36.7 C), temperature source Oral, resp. rate 18, height 5\' 8"  (1.727 m), weight 112.6 kg (248 lb 3.8 oz), SpO2 98.00%.   Intake/Output Summary (Last 24  hours) at 08/17/12 0940 Last data filed at 08/16/12 1907  Gross per 24 hour  Intake    480 ml  Output      0 ml  Net    480 ml    Exam Awake Alert, Oriented *3, No new F.N deficits, Normal affect [place].AT,PERRAL Supple Neck,No JVD, No cervical lymphadenopathy appriciated.  Symmetrical Chest wall movement, Good air movement bilaterally, CTAB RRR,No Gallops,Rubs or new Murmurs, No Parasternal Heave +ve B.Sounds, Abd Soft, Non tender, No organomegaly appriciated, No rebound -guarding or rigidity. No Cyanosis, Clubbing or edema, No new Rash or bruise  Data Review   Major procedures and Radiology Reports - PLEASE review detailed and final reports for all details in brief -       No results found.  Micro Results      Recent Results (from the past 240 hour(s))  MRSA PCR SCREENING     Status: Normal   Collection Time   08/13/12 10:45 PM      Component Value Range Status Comment   MRSA by PCR NEGATIVE  NEGATIVE Final      CBC w Diff: Lab Results  Component Value Date   WBC 4.8 08/16/2012   HGB 8.7* 08/17/2012   HCT 28.5* 08/17/2012   PLT 144* 08/16/2012   LYMPHOPCT 23 08/14/2012   MONOPCT 10 08/14/2012   EOSPCT 1 08/14/2012   BASOPCT 0 08/14/2012    CMP: Lab Results  Component Value Date   NA 137 08/16/2012   K 4.1 08/16/2012   CL 102 08/16/2012   CO2 25 08/16/2012   BUN 11 08/16/2012   CREATININE 0.80 08/16/2012   PROT 6.9 08/13/2012   ALBUMIN 3.7 08/13/2012   BILITOT 0.4 08/13/2012   ALKPHOS 106 08/13/2012   AST 20 08/13/2012   ALT 14 08/13/2012  .   Discharge Instructions     Follow with Primary MD Rene Paci, MD in 3 days and he a heart doctor in one week  Get CBC, CMP, checked 3 days by Primary MD and again as instructed by your Primary MD   Get Medicines reviewed and adjusted.  Please request your Prim.MD to go over all Hospital Tests and Procedure/Radiological results at the follow up, please get all Hospital records sent to your Prim MD by  signing hospital release before you go home.  Activity: As tolerated with Full fall precautions use walker/cane & assistance as needed   Diet:  Heart healthy and low carbohydrate  For Heart failure patients - Check your Weight same time everyday, if you gain over 2 pounds, or you develop in leg swelling, experience more shortness of breath or chest pain, call your Primary MD immediately. Follow Cardiac Low  Salt Diet and 1.8 lit/day fluid restriction.  Disposition Home   If you experience worsening of your admission symptoms, develop shortness of breath, life threatening emergency, suicidal or homicidal thoughts you must seek medical attention immediately by calling 911 or calling your MD immediately  if symptoms less severe.  You Must read complete instructions/literature along with all the possible adverse reactions/side effects for all the Medicines you take and that have been prescribed to you. Take any new Medicines after you have completely understood and accpet all the possible adverse reactions/side effects.   Do not drive and provide baby sitting services if your were admitted for syncope or siezures until you have seen by Primary MD or a Neurologist and advised to do so again.  Do not drive when taking Pain medications.    Do not take more than prescribed Pain, Sleep and Anxiety Medications  Special Instructions: If you have smoked or chewed Tobacco  in the last 2 yrs please stop smoking, stop any regular Alcohol  and or any Recreational drug use.  Wear Seat belts while driving.  Follow-up Information    Follow up with Rene Paci, MD. Schedule an appointment as soon as possible for a visit in 3 days.   Contact information:   [address] [place] [address] [phone]       Follow up with Graylin Shiver, MD. Schedule an appointment as soon as possible for a visit in 1 week.   Contact information:   [address] [place] [address] [phone]            Discharge Medications     Medication List     As of 08/17/2012  9:40 AM    START taking these medications         pantoprazole 40 MG tablet   Commonly known as: PROTONIX   Take 1 tablet (40 mg total) by mouth 2 (two) times daily. Can substitute with any twice a day PPI      CONTINUE taking these medications         aspirin EC 81 MG tablet      atorvastatin 80 MG tablet   Commonly known as: LIPITOR   Take 1 tablet (80 mg total) by mouth daily.      furosemide 20 MG tablet   Commonly known as: LASIX   Take 1 tablet (20 mg total) by mouth daily.      losartan 50 MG tablet   Commonly known as: COZAAR   Take 1 tablet (50 mg total) by mouth daily.      metFORMIN 500 MG tablet   Commonly known as: GLUCOPHAGE   Take 1 tablet (500 mg total) by mouth 2 (two) times daily with a meal. Hold until 06/30/2012.      nitroGLYCERIN 0.4 MG SL tablet   Commonly known as: NITROSTAT   Place 1 tablet (0.4 mg total) under the tongue every 5 (five) minutes as needed for chest pain.      prasugrel 10 MG Tabs   Commonly known as: EFFIENT   Take 1 tablet (10 mg total) by mouth daily.      STOP taking these medications         ranitidine 300 MG tablet   Commonly known as: ZANTAC          Where to get your medications    These are the prescriptions that you need to pick up.  We sent them to a specific pharmacy, so you will need to go there to get them.   [facility] [address] [place], [place] - [address]    [address] [place] [address]    Phone: [phone]        pantoprazole 40 MG tablet               Total Time in preparing paper work, data evaluation and todays exam - 35 minutes  Leroy Sea M.D on 08/17/2012 at 9:40 AM  [facility] Office  [phone]

## 2012-08-20 ENCOUNTER — Telehealth: Payer: Self-pay | Admitting: General Practice

## 2012-08-20 NOTE — Telephone Encounter (Signed)
LMOM for patient to call office to schedule hospital f/u visit. 

## 2012-08-21 ENCOUNTER — Other Ambulatory Visit (INDEPENDENT_AMBULATORY_CARE_PROVIDER_SITE_OTHER): Payer: Medicare Other

## 2012-08-21 ENCOUNTER — Ambulatory Visit (INDEPENDENT_AMBULATORY_CARE_PROVIDER_SITE_OTHER): Payer: Medicare Other | Admitting: Internal Medicine

## 2012-08-21 ENCOUNTER — Encounter: Payer: Self-pay | Admitting: Internal Medicine

## 2012-08-21 VITALS — BP 112/60 | HR 77 | Temp 98.8°F | Ht 68.0 in | Wt 239.2 lb

## 2012-08-21 DIAGNOSIS — R1013 Epigastric pain: Secondary | ICD-10-CM

## 2012-08-21 DIAGNOSIS — I2510 Atherosclerotic heart disease of native coronary artery without angina pectoris: Secondary | ICD-10-CM

## 2012-08-21 DIAGNOSIS — I10 Essential (primary) hypertension: Secondary | ICD-10-CM

## 2012-08-21 DIAGNOSIS — D62 Acute posthemorrhagic anemia: Secondary | ICD-10-CM

## 2012-08-21 LAB — CBC WITH DIFFERENTIAL/PLATELET
Basophils Absolute: 0 10*3/uL (ref 0.0–0.1)
Basophils Relative: 0.1 % (ref 0.0–3.0)
Eosinophils Absolute: 0.1 10*3/uL (ref 0.0–0.7)
MCHC: 30.8 g/dL (ref 30.0–36.0)
MCV: 78.6 fl (ref 78.0–100.0)
Monocytes Absolute: 0.4 10*3/uL (ref 0.1–1.0)
Neutrophils Relative %: 76.2 % (ref 43.0–77.0)
RBC: 3.46 Mil/uL — ABNORMAL LOW (ref 4.22–5.81)
RDW: 19.2 % — ABNORMAL HIGH (ref 11.5–14.6)

## 2012-08-21 NOTE — Patient Instructions (Signed)
It was good to see you today. We have reviewed your prior records including labs and tests today Test(s) ordered today. Your results will be released to MyChart (or called to you) after review, usually within 72hours after test completion. If any changes need to be made, you will be notified at that same time. Medications reviewed and updated, no changes at this time. Please keep scheduled followup in 3 months, call sooner if problems. Gastroesophageal Reflux Disease, Adult Gastroesophageal reflux disease (GERD) happens when acid from your stomach goes into your food pipe (esophagus). The acid can cause a burning feeling in your chest. Over time, the acid can make small holes (ulcers) in your food pipe.   HOME CARE  Ask your doctor for advice about:   Losing weight.   Quitting smoking.   Alcohol use.   Avoid foods and drinks that make your problems worse. You may want to avoid:   Caffeine and alcohol.   Chocolate.   Mints.   Garlic and onions.   Spicy foods.   Citrus fruits, such as oranges, lemons, or limes.   Foods that contain tomato, such as sauce, chili, salsa, and pizza.   Fried and fatty foods.   Avoid lying down for 3 hours before you go to bed or before you take a nap.   Eat small meals often, instead of large meals.   Wear loose-fitting clothing. Do not wear anything tight around your waist.   Raise (elevate) the head of your bed 6 to 8 inches with wood blocks. Using extra pillows does not help.   Only take medicines as told by your doctor.   Do not take aspirin or ibuprofen.  GET HELP RIGHT AWAY IF:    You have pain in your arms, neck, jaw, teeth, or back.   Your pain gets worse or changes.   You feel sick to your stomach (nauseous), throw up (vomit), or sweat (diaphoresis).   You feel short of breath, or you pass out (faint).   Your throw up is green, yellow, black, or looks like coffee grounds or blood.   Your poop (stool) is red, bloody, or  black.  MAKE SURE YOU:    Understand these instructions.   Will watch your condition.   Will get help right away if you are not doing well or get worse.  Document Released: 02/01/2008 Document Revised: 11/07/2011 Document Reviewed: 03/04/2011 [facility] Patient Information 2013 [place], [place].

## 2012-08-21 NOTE — Assessment & Plan Note (Signed)
Prior pain has resolved - attributed to ulcers as seen on EGD 08/15/12 PPI as ongoing

## 2012-08-21 NOTE — Assessment & Plan Note (Signed)
BP Readings from Last 3 Encounters:  08/21/12 112/60  08/17/12 128/59  08/17/12 128/59   On ARB since 07/2010 - The current medical regimen is effective;  continue present plan and medications.

## 2012-08-21 NOTE — Assessment & Plan Note (Signed)
PTCA/DES to LAD 06/28/12 - on Effient and ASA 81 - briefly held due to GIB 07/2012 x 48h Also statin and ARB Follows regularly with cards for same -

## 2012-08-21 NOTE — Progress Notes (Signed)
Subjective:    Patient ID: [name], male    DOB: 05-16-1946, 66 y.o.   MRN: [id number]  HPI  Here for hospital follow up - Admission date:  08/13/2012 Discharge Date:  08/17/2012   Diagnosis  Type II or unspecified type diabetes mellitus without mention of complication, not stated as uncontrolled [250.00] Unspecified essential hypertension [401.9] Esophageal reflux [530.81] Coronary artery disease [414.00] Acute blood loss anemia [285.1] Epigastric pain [789.06] GI bleed [578.9] Abdominal pain, acute, epigastric [789.06, 338.19] low hemoglobin   Since DC home, no recurrent pain, no melena taking meds as rx'd  also reviewed chronic medical issues:  CAD - s/p PTCA/DES to LAD 06/28/12 - after procedure new pain located in epigastric and SS chest related to gastric/antal ulcer (see hosp 07/2012 for same), now resolved - taking all meds as rx'd after cath/DES  OSA - working with pulm and started CPAP for same 12/2010 - variable compliance -  DM2 - dx 2005  - on metformin since 07/2010- checks sugars 1-2x/day -the patient reports compliance with medication(s) as prescribed. Denies adverse side effects.  hypertension - on ARB since 07/2010 - denies chest pain or headache but chronic edema BLE without change-   EtOH abuse - reports cutting back on use > currently reports 1-2 beer/day - denies WD symptoms or prior DTs - no known liver complications or hx legal/employer issues with alcohol   gout - no swelling or recent trauma/injury; +hx prior flares - improved with self-tx cherry juice    Past Medical History  Diagnosis Date  . Type II or unspecified type diabetes mellitus without mention of complication, not stated as uncontrolled   . Unspecified essential hypertension   . Esophageal reflux   . Gout   . OSA (obstructive sleep apnea)     cpap started 12/2010  . Arthritis   . Umbilical hernia   . Ventral hernia   . Coronary artery disease     PTCA/DES to LAD 06/28/12     Review of Systems  Constitutional: Negative for fever and unexpected weight change.  Respiratory: Negative for cough, shortness of breath and wheezing.   Cardiovascular: Negative for palpitations and leg swelling.  Gastrointestinal: Negative for abdominal pain, abdominal distention and anal bleeding.       Objective:   Physical Exam  BP 112/60  Pulse 77  Temp 98.8 F (37.1 C) (Oral)  Ht 5\' 8"  (1.727 m)  Wt 239 lb 3.2 oz (108.5 kg)  BMI 36.37 kg/m2  SpO2 98% Wt Readings from Last 3 Encounters:  08/21/12 239 lb 3.2 oz (108.5 kg)  08/15/12 248 lb 3.8 oz (112.6 kg)  08/15/12 248 lb 3.8 oz (112.6 kg)   Constitutional:  Obese; appears well-developed and well-nourished. No distress. wife at side - generalized poor hygiene Neck: Normal range of motion. Thick. Neck supple. No JVD present. No thyromegaly present.  Cardiovascular: Normal rate, regular rhythm and normal heart sounds.  No murmur heard. 1+ pitting BLE edema to below knee Pulmonary/Chest: Effort normal and breath sounds normal. No respiratory distress. no wheezes.  Abdominal: SNTND, bowel sounds are normal. There is no tenderness, r/g. resolved umbilical hernia, no recurrence Psychiatric: he has a normal mood and affect. behavior is normal. Judgment and thought content normal.   Lab Results  Component Value Date   WBC 4.8 08/16/2012   HGB 8.7* 08/17/2012   HCT 28.5* 08/17/2012   PLT 144* 08/16/2012   CHOL 168 05/28/2012   TRIG 198.0* 05/28/2012   HDL  29.60* 05/28/2012   ALT 14 08/13/2012   AST 20 08/13/2012   NA 137 08/16/2012   K 4.1 08/16/2012   CL 102 08/16/2012   CREATININE 0.80 08/16/2012   BUN 11 08/16/2012   CO2 25 08/16/2012   TSH 1.28 05/28/2012   INR 1.30 08/14/2012   HGBA1C 5.7 08/13/2012   08/15/12: EGD: several small antral ulcers and erosions with no apparent active bleed     Assessment & Plan:   see problem list. Medications and labs reviewed today.  Time spent with pt/family today 25  minutes, greater than 50% time spent counseling patient on hospitalization for GU, ABL anemia and medication review. Also review of hospital records

## 2012-08-21 NOTE — Assessment & Plan Note (Signed)
EGD 12/18 13 revealing several small antral ulcers and erosions with no apparent active bleed he was initially placed on IV PPI and his aspirin and antiplatelet medications were held, thereafter his H&H stabilized transitioned to oral PPI  - H&H has remained stable antiplatelet medications have been resumed due to recent DES stent.  Recheck CBC today and continue PPI, GI follow up as needed

## 2012-08-27 ENCOUNTER — Ambulatory Visit: Payer: Medicare Other | Admitting: Internal Medicine

## 2012-09-03 ENCOUNTER — Telehealth: Payer: Self-pay | Admitting: *Deleted

## 2012-09-03 ENCOUNTER — Other Ambulatory Visit (INDEPENDENT_AMBULATORY_CARE_PROVIDER_SITE_OTHER): Payer: Medicare Other

## 2012-09-03 ENCOUNTER — Other Ambulatory Visit: Payer: Self-pay | Admitting: Internal Medicine

## 2012-09-03 DIAGNOSIS — D62 Acute posthemorrhagic anemia: Secondary | ICD-10-CM

## 2012-09-03 LAB — CBC WITH DIFFERENTIAL/PLATELET
Basophils Absolute: 0 10*3/uL (ref 0.0–0.1)
Basophils Relative: 0.5 % (ref 0.0–3.0)
Eosinophils Absolute: 0.1 10*3/uL (ref 0.0–0.7)
Lymphocytes Relative: 16.2 % (ref 12.0–46.0)
MCHC: 31.1 g/dL (ref 30.0–36.0)
Neutrophils Relative %: 71.5 % (ref 43.0–77.0)
RBC: 2.98 Mil/uL — ABNORMAL LOW (ref 4.22–5.81)
RDW: 20.2 % — ABNORMAL HIGH (ref 11.5–14.6)

## 2012-09-03 NOTE — Telephone Encounter (Signed)
Notified [name] @ [facility] made transfusion appt for 09/07/12. Faxed over order to [phone]. Notified pt with appt as well as lab for type & cross wed 09/05/12...[name]

## 2012-09-03 NOTE — Telephone Encounter (Signed)
Noted - i have placed orders for short stay to transfuse 2U PRBC - please call short stay to arrange and verify same - should be done ASAP - thanks

## 2012-09-03 NOTE — Telephone Encounter (Signed)
Lab called with critical results-Hgb 7.0, Hct 22.5.

## 2012-09-05 ENCOUNTER — Other Ambulatory Visit: Payer: Self-pay | Admitting: Internal Medicine

## 2012-09-05 ENCOUNTER — Telehealth: Payer: Self-pay | Admitting: *Deleted

## 2012-09-05 ENCOUNTER — Other Ambulatory Visit (HOSPITAL_COMMUNITY): Payer: Self-pay | Admitting: Internal Medicine

## 2012-09-05 DIAGNOSIS — D649 Anemia, unspecified: Secondary | ICD-10-CM | POA: Insufficient documentation

## 2012-09-05 NOTE — Telephone Encounter (Signed)
Per Ester at [facility], orders for pt's type and cross need to be released in Epic in order for them to have samples prepared before pt's transfusion.

## 2012-09-05 NOTE — Telephone Encounter (Signed)
i have added requested T&S - thanks

## 2012-09-06 ENCOUNTER — Other Ambulatory Visit (HOSPITAL_COMMUNITY): Payer: Self-pay | Admitting: Internal Medicine

## 2012-09-07 ENCOUNTER — Encounter (HOSPITAL_COMMUNITY)
Admission: RE | Admit: 2012-09-07 | Discharge: 2012-09-07 | Disposition: A | Discharge status: Home / Self Care | Payer: Medicare Other | Source: Ambulatory Visit | Attending: Internal Medicine | Admitting: Internal Medicine

## 2012-09-07 ENCOUNTER — Encounter (HOSPITAL_COMMUNITY): Payer: Self-pay

## 2012-09-07 VITALS — BP 116/71 | HR 65 | Temp 97.7°F | Resp 16 | Ht 69.0 in | Wt 243.0 lb

## 2012-09-07 DIAGNOSIS — D62 Acute posthemorrhagic anemia: Secondary | ICD-10-CM

## 2012-09-07 LAB — HEMOGLOBIN AND HEMATOCRIT, BLOOD
HCT: 25.4 % — ABNORMAL LOW (ref 39.0–52.0)
Hemoglobin: 7.8 g/dL — ABNORMAL LOW (ref 13.0–17.0)

## 2012-09-07 LAB — ABO/RH: ABO/RH(D): O POS

## 2012-09-07 MED ORDER — FUROSEMIDE 10 MG/ML IJ SOLN
20.0000 mg | Freq: Once | INTRAMUSCULAR | Status: AC
  Administered 2012-09-07: 20 mg via INTRAVENOUS
  Filled 2012-09-07: qty 2

## 2012-09-07 MED ORDER — SODIUM CHLORIDE 0.9 % IV SOLN
INTRAVENOUS | Status: DC
  Administered 2012-09-07: 09:00:00 via INTRAVENOUS

## 2012-09-07 MED ORDER — DIPHENHYDRAMINE HCL 25 MG PO CAPS
25.0000 mg | ORAL_CAPSULE | Freq: Once | ORAL | Status: AC
  Administered 2012-09-07: 25 mg via ORAL
  Filled 2012-09-07: qty 1

## 2012-09-07 MED ORDER — ACETAMINOPHEN 325 MG PO TABS
650.0000 mg | ORAL_TABLET | Freq: Once | ORAL | Status: AC
  Administered 2012-09-07: 650 mg via ORAL
  Filled 2012-09-07: qty 2

## 2012-09-09 LAB — TYPE AND SCREEN: Unit division: 0

## 2012-09-10 ENCOUNTER — Telehealth: Payer: Self-pay | Admitting: Internal Medicine

## 2012-09-10 DIAGNOSIS — D62 Acute posthemorrhagic anemia: Secondary | ICD-10-CM

## 2012-09-10 NOTE — Telephone Encounter (Signed)
Please let pt know he needs to come in for repeat CBC (lab only) 09/14/12 to verify that his anemia is stable -  Lab entered in orders - thanks

## 2012-09-11 NOTE — Telephone Encounter (Signed)
Notified pt spoke with his son gave md response...[name]

## 2012-09-14 ENCOUNTER — Other Ambulatory Visit: Payer: Self-pay | Admitting: Internal Medicine

## 2012-09-14 ENCOUNTER — Other Ambulatory Visit (INDEPENDENT_AMBULATORY_CARE_PROVIDER_SITE_OTHER): Payer: Medicare Other

## 2012-09-14 ENCOUNTER — Telehealth: Payer: Self-pay | Admitting: *Deleted

## 2012-09-14 DIAGNOSIS — D62 Acute posthemorrhagic anemia: Secondary | ICD-10-CM

## 2012-09-14 DIAGNOSIS — K922 Gastrointestinal hemorrhage, unspecified: Secondary | ICD-10-CM

## 2012-09-14 LAB — CBC WITH DIFFERENTIAL/PLATELET
Basophils Absolute: 0 10*3/uL (ref 0.0–0.1)
HCT: 22 % — CL (ref 39.0–52.0)
Lymphocytes Relative: 18.4 % (ref 12.0–46.0)
Lymphs Abs: 0.9 10*3/uL (ref 0.7–4.0)
Monocytes Relative: 7.7 % (ref 3.0–12.0)
Platelets: 150 10*3/uL (ref 150.0–400.0)
RDW: 20.6 % — ABNORMAL HIGH (ref 11.5–14.6)

## 2012-09-14 NOTE — Telephone Encounter (Signed)
Lab called with critical results- Hgb 6.9, Hct 22, VAL informed verbally.

## 2012-09-14 NOTE — Telephone Encounter (Signed)
Noted - spoke with pt's cardiologist via phone (P Nahser) re: situation of slow ongoing GIB and recurrent ABL anemia -  Given gastric/antral erosions on EGD 08/14/12, will stop NSAID (ASA 81) and continue Effient for recent LAD DES placed 06/28/12  Pt and son informed of same; verified pt taking PPI BID - arrange another 2U PRBC transfusion with CBC recheck 1 week after transfusion - my office will call with transfusion once OP arrangements done  Pt understands to go to ER if symptomatic bleeding, increase chest pain or abdominal pain   [phone] (home)  -number i called

## 2012-09-14 NOTE — Telephone Encounter (Signed)
Called [facility] Stay, appointment schedule for next available 09/20/2012 at 8:00am. Orders faxed to [phone].  Pt's son informed of appointment and lab for type and cross on 09/19/2012.

## 2012-09-17 DIAGNOSIS — I2510 Atherosclerotic heart disease of native coronary artery without angina pectoris: Secondary | ICD-10-CM

## 2012-09-17 DIAGNOSIS — D62 Acute posthemorrhagic anemia: Secondary | ICD-10-CM

## 2012-09-17 DIAGNOSIS — R1013 Epigastric pain: Secondary | ICD-10-CM

## 2012-09-17 DIAGNOSIS — I10 Essential (primary) hypertension: Secondary | ICD-10-CM

## 2012-09-17 DIAGNOSIS — R52 Pain, unspecified: Secondary | ICD-10-CM

## 2012-09-20 ENCOUNTER — Encounter (HOSPITAL_COMMUNITY): Payer: Self-pay

## 2012-09-20 ENCOUNTER — Encounter (HOSPITAL_COMMUNITY)
Admission: RE | Admit: 2012-09-20 | Discharge: 2012-09-20 | Disposition: A | Discharge status: Home / Self Care | Payer: Medicare Other | Source: Ambulatory Visit | Attending: Internal Medicine | Admitting: Internal Medicine

## 2012-09-20 VITALS — BP 103/60 | HR 72 | Temp 97.1°F | Resp 18

## 2012-09-20 DIAGNOSIS — K922 Gastrointestinal hemorrhage, unspecified: Secondary | ICD-10-CM

## 2012-09-20 DIAGNOSIS — D62 Acute posthemorrhagic anemia: Secondary | ICD-10-CM

## 2012-09-20 LAB — HEMOGLOBIN AND HEMATOCRIT, BLOOD: Hemoglobin: 8.6 g/dL — ABNORMAL LOW (ref 13.0–17.0)

## 2012-09-20 MED ORDER — FUROSEMIDE 10 MG/ML IJ SOLN
20.0000 mg | Freq: Once | INTRAMUSCULAR | Status: AC
  Administered 2012-09-20: 20 mg via INTRAVENOUS
  Filled 2012-09-20: qty 2

## 2012-09-20 MED ORDER — DIPHENHYDRAMINE HCL 25 MG PO CAPS
25.0000 mg | ORAL_CAPSULE | Freq: Once | ORAL | Status: AC
  Administered 2012-09-20: 25 mg via ORAL
  Filled 2012-09-20: qty 1

## 2012-09-20 MED ORDER — SODIUM CHLORIDE 0.9 % IV SOLN
Freq: Once | INTRAVENOUS | Status: AC
  Administered 2012-09-20: 08:00:00 via INTRAVENOUS

## 2012-09-20 MED ORDER — ACETAMINOPHEN 325 MG PO TABS
650.0000 mg | ORAL_TABLET | Freq: Once | ORAL | Status: AC
  Administered 2012-09-20: 650 mg via ORAL
  Filled 2012-09-20: qty 2

## 2012-09-21 LAB — TYPE AND SCREEN
ABO/RH(D): O POS
Unit division: 0

## 2012-09-30 ENCOUNTER — Other Ambulatory Visit: Payer: Self-pay | Admitting: Internal Medicine

## 2012-10-17 ENCOUNTER — Other Ambulatory Visit (INDEPENDENT_AMBULATORY_CARE_PROVIDER_SITE_OTHER): Payer: Medicare Other

## 2012-10-17 ENCOUNTER — Encounter: Payer: Self-pay | Admitting: Internal Medicine

## 2012-10-17 ENCOUNTER — Other Ambulatory Visit: Payer: Self-pay | Admitting: Internal Medicine

## 2012-10-17 ENCOUNTER — Ambulatory Visit (INDEPENDENT_AMBULATORY_CARE_PROVIDER_SITE_OTHER): Payer: Medicare Other | Admitting: Internal Medicine

## 2012-10-17 ENCOUNTER — Telehealth: Payer: Self-pay | Admitting: *Deleted

## 2012-10-17 VITALS — BP 118/60 | HR 82 | Temp 97.7°F | Wt 245.8 lb

## 2012-10-17 DIAGNOSIS — K259 Gastric ulcer, unspecified as acute or chronic, without hemorrhage or perforation: Secondary | ICD-10-CM

## 2012-10-17 DIAGNOSIS — D62 Acute posthemorrhagic anemia: Secondary | ICD-10-CM

## 2012-10-17 DIAGNOSIS — I2510 Atherosclerotic heart disease of native coronary artery without angina pectoris: Secondary | ICD-10-CM

## 2012-10-17 DIAGNOSIS — E119 Type 2 diabetes mellitus without complications: Secondary | ICD-10-CM

## 2012-10-17 LAB — CBC WITH DIFFERENTIAL/PLATELET
Basophils Absolute: 0 10*3/uL (ref 0.0–0.1)
Eosinophils Absolute: 0.1 10*3/uL (ref 0.0–0.7)
Hemoglobin: 7.5 g/dL — CL (ref 13.0–17.0)
Lymphocytes Relative: 18.8 % (ref 12.0–46.0)
MCHC: 31.1 g/dL (ref 30.0–36.0)
Neutro Abs: 3.1 10*3/uL (ref 1.4–7.7)
Neutrophils Relative %: 71.2 % (ref 43.0–77.0)
RDW: 20.1 % — ABNORMAL HIGH (ref 11.5–14.6)

## 2012-10-17 NOTE — Telephone Encounter (Signed)
Please call patient and family: hemoglobin level of 7.5.  This level of anemia is not as severe as prior episodes but does require 2 units blood transfusion.  I will arrange for same at outpatient hospital as before, will call with appointment scheduled once known.  He needs to keep followup appointment with gastroenterology as I have referred today  -please call sooner if problems

## 2012-10-17 NOTE — Telephone Encounter (Signed)
Called pt spoke with son [name]) gave md response. Inform him will call back soon as we get transfusion set-up...Raechel Chute

## 2012-10-17 NOTE — Patient Instructions (Signed)
It was good to see you today. We have reviewed your prior records including labs and tests today Test(s) ordered today. Your results will be released to MyChart (or called to you) after review, usually within 72hours after test completion. If any changes need to be made, you will be notified at that same time. Medications reviewed and updated, no changes at this time. we'll make referral to Dr Evette Cristal to follow up on your ulcer and bleeding  . Our office will contact you regarding appointment(s) once made. Keep follow up with Dr Elease Hashimoto as planned

## 2012-10-17 NOTE — Assessment & Plan Note (Signed)
PTCA/DES to LAD 06/28/12 - on Effient and ASA 81 - briefly held due to GIB 07/2012 x 48h Then stopped ASA 81 due to recurrent GI loss anemia No angina continue statin and ARB Follows regularly with cards for same -

## 2012-10-17 NOTE — Assessment & Plan Note (Signed)
Prior pain has resolved - attributed to ulcers as seen on EGD 08/15/12 PPI as ongoing Refer to GI because of recurrent bleeding

## 2012-10-17 NOTE — Telephone Encounter (Signed)
Contacted [facility] spoke was told they didn't have any appt. Called [facility] short stay x's 2 no answer....Raechel Chute

## 2012-10-17 NOTE — Telephone Encounter (Signed)
Hemoglobin 7.5...lmb

## 2012-10-17 NOTE — Assessment & Plan Note (Signed)
EGD 12/18 13 revealing several small antral ulcers and erosions with no apparent active bleed he was initially placed on IV PPI and his aspirin and antiplatelet medications were held, thereafter his H&H stabilized transitioned to oral PPI  -  Recurrent ABL anemia on follow up - arrange for outpatient 2 unit transfusion and stopped aspirin therapy after discussion with cardiology Initially, both antiplatelet medications had been resumed due to recent DES stent.  Now with recurrent symptoms, no angina at this time Recheck CBC today and continue PPI, will arrange GI follow up

## 2012-10-17 NOTE — Progress Notes (Signed)
Subjective:    Patient ID: [name], male    DOB: [birth year], 67 y.o.   MRN: [id number]  HPI  Here for ?recurrent anemia - describes symptoms of weakness, dizziness and "pale" Denies BRBPR, nausea, epigastric pain, bowel changes, melena Stopped aspirin as instructed person and December 17 but continues on Effient for recent cardiac stent 06/28/2012  also reviewed chronic medical issues:  CAD - s/p PTCA/DES to LAD 06/28/12 - after procedure new pain located in epigastric and SS chest related to gastric/antal ulcer (see hosp 07/2012 for same), now resolved - taking all meds as rx'd after cath/DES  OSA - working with pulm and started CPAP for same 12/2010 - variable compliance -  DM2 - dx 2005  - on metformin since 07/2010- checks sugars 1-2x/day -the patient reports compliance with medication(s) as prescribed. Denies adverse side effects.  hypertension - on ARB since 07/2010 - denies chest pain or headache but chronic edema BLE without change-   EtOH abuse - reports cutting back on use > currently reports 1-2 beer/day - denies WD symptoms or prior DTs - no known liver complications or hx legal/employer issues with alcohol   gout - no swelling or recent trauma/injury; +hx prior flares - improved with self-tx cherry juice     Past Medical History  Diagnosis Date  . Type II or unspecified type diabetes mellitus without mention of complication, not stated as uncontrolled   . Unspecified essential hypertension   . Esophageal reflux   . Gout   . OSA (obstructive sleep apnea)     cpap started 12/2010  . Arthritis   . Umbilical hernia   . Ventral hernia   . Coronary artery disease     PTCA/DES to LAD 06/28/12    Review of Systems  Constitutional: Negative for fever and unexpected weight change.  Respiratory: Negative for cough, shortness of breath and wheezing.   Cardiovascular: Negative for palpitations and leg swelling.  Gastrointestinal: Negative for abdominal pain, abdominal  distention and anal bleeding.       Objective:   Physical Exam  BP 118/60  Pulse 82  Temp(Src) 97.7 F (36.5 C) (Oral)  Wt 245 lb 12.8 oz (111.494 kg)  BMI 36.28 kg/m2  SpO2 98% Wt Readings from Last 3 Encounters:  10/17/12 245 lb 12.8 oz (111.494 kg)  09/07/12 243 lb (110.224 kg)  08/21/12 239 lb 3.2 oz (108.5 kg)   Constitutional:  Obese; appears well-developed and well-nourished. No distress. wife and son at side - generalized poor hygiene Neck: Normal range of motion. Thick. Neck supple. No JVD present. No thyromegaly present.  Cardiovascular: Normal rate, regular rhythm and normal heart sounds.  No murmur heard. 1+ pitting BLE edema to below knee Pulmonary/Chest: Effort normal and breath sounds normal. No respiratory distress. no wheezes.  Abdominal: SNTND, bowel sounds are normal. There is no tenderness, r/g. resolved umbilical hernia, no recurrence Psychiatric: he has a normal mood and affect. behavior is normal. Judgment and thought content normal.   Lab Results  Component Value Date   WBC 5.0 09/14/2012   HGB 8.6* 09/20/2012   HCT 28.6* 09/20/2012   PLT 150.0 09/14/2012   CHOL 168 05/28/2012   TRIG 198.0* 05/28/2012   HDL 29.60* 05/28/2012   ALT 14 08/13/2012   AST 20 08/13/2012   NA 137 08/16/2012   K 4.1 08/16/2012   CL 102 08/16/2012   CREATININE 0.80 08/16/2012   BUN 11 08/16/2012   CO2 25 08/16/2012   TSH 1.28  05/28/2012   INR 1.30 08/14/2012   HGBA1C 5.7 08/13/2012   08/15/12: EGD: several small antral ulcers and erosions with no apparent active bleed     Assessment & Plan:   see problem list. Medications and labs reviewed today.

## 2012-10-17 NOTE — Assessment & Plan Note (Signed)
On metformin for same since 07/2010- also ARB and statin  Lab Results  Component Value Date   HGBA1C 5.7 08/13/2012

## 2012-10-18 NOTE — Telephone Encounter (Signed)
Please llok into this and get it scheduled

## 2012-10-18 NOTE — Telephone Encounter (Signed)
Appoinment scheduled at [facility] Stay 10/24/2012 at 8:00am. Pt's son informed (also informed to go on Tuesday for type and cross).

## 2012-10-23 DIAGNOSIS — D62 Acute posthemorrhagic anemia: Secondary | ICD-10-CM | POA: Insufficient documentation

## 2012-10-24 ENCOUNTER — Encounter (HOSPITAL_COMMUNITY): Payer: Self-pay

## 2012-10-24 ENCOUNTER — Encounter (HOSPITAL_COMMUNITY)
Admission: RE | Admit: 2012-10-24 | Discharge: 2012-10-24 | Disposition: A | Discharge status: Home / Self Care | Payer: Medicare Other | Source: Ambulatory Visit | Attending: Internal Medicine | Admitting: Internal Medicine

## 2012-10-24 VITALS — BP 110/60 | HR 71 | Temp 97.1°F | Resp 18 | Ht 68.0 in

## 2012-10-24 DIAGNOSIS — D62 Acute posthemorrhagic anemia: Secondary | ICD-10-CM

## 2012-10-24 LAB — CBC
HCT: 28.3 % — ABNORMAL LOW (ref 39.0–52.0)
Hemoglobin: 8.5 g/dL — ABNORMAL LOW (ref 13.0–17.0)
MCH: 22.7 pg — ABNORMAL LOW (ref 26.0–34.0)
MCV: 75.7 fL — ABNORMAL LOW (ref 78.0–100.0)
RBC: 3.74 MIL/uL — ABNORMAL LOW (ref 4.22–5.81)

## 2012-10-24 MED ORDER — FUROSEMIDE 10 MG/ML IJ SOLN
20.0000 mg | Freq: Once | INTRAMUSCULAR | Status: AC
  Administered 2012-10-24: 20 mg via INTRAVENOUS
  Filled 2012-10-24: qty 2

## 2012-10-24 MED ORDER — DIPHENHYDRAMINE HCL 25 MG PO CAPS
25.0000 mg | ORAL_CAPSULE | Freq: Once | ORAL | Status: AC
  Administered 2012-10-24: 25 mg via ORAL
  Filled 2012-10-24: qty 1

## 2012-10-24 MED ORDER — SODIUM CHLORIDE 0.9 % IV SOLN
INTRAVENOUS | Status: DC
  Administered 2012-10-24: 09:00:00 via INTRAVENOUS

## 2012-10-24 MED ORDER — ACETAMINOPHEN 325 MG PO TABS
650.0000 mg | ORAL_TABLET | Freq: Once | ORAL | Status: AC
  Administered 2012-10-24: 650 mg via ORAL
  Filled 2012-10-24: qty 2

## 2012-10-24 NOTE — Progress Notes (Signed)
Tolerated first unit ov blood well. No signs of reaction noted.

## 2012-10-25 ENCOUNTER — Telehealth: Payer: Self-pay | Admitting: *Deleted

## 2012-10-25 LAB — TYPE AND SCREEN

## 2012-10-25 NOTE — Telephone Encounter (Signed)
He needs an ov in next 4 weeks or so.

## 2012-10-25 NOTE — Telephone Encounter (Signed)
LMOMTCB x1  Needs appt within next 4 weeks to review sleep--D/L received of compliance and pt needs to follow-up ( 1 year)

## 2012-10-25 NOTE — Telephone Encounter (Signed)
D/L Compliance report is in your green folder for review. Please advise if patient needs appt before this can be signed. Thanks.

## 2012-11-14 ENCOUNTER — Ambulatory Visit (INDEPENDENT_AMBULATORY_CARE_PROVIDER_SITE_OTHER): Payer: Medicare Other | Admitting: Internal Medicine

## 2012-11-14 ENCOUNTER — Encounter: Payer: Self-pay | Admitting: Internal Medicine

## 2012-11-14 VITALS — BP 132/70 | HR 76 | Temp 99.1°F | Wt 247.4 lb

## 2012-11-14 DIAGNOSIS — R079 Chest pain, unspecified: Secondary | ICD-10-CM

## 2012-11-14 DIAGNOSIS — K5900 Constipation, unspecified: Secondary | ICD-10-CM

## 2012-11-14 DIAGNOSIS — R109 Unspecified abdominal pain: Secondary | ICD-10-CM

## 2012-11-14 DIAGNOSIS — R1012 Left upper quadrant pain: Secondary | ICD-10-CM

## 2012-11-14 MED ORDER — POLYETHYLENE GLYCOL 3350 17 GM/SCOOP PO POWD: 17.0000 g | Freq: Every day | ORAL | Status: DC

## 2012-11-14 NOTE — Progress Notes (Signed)
Subjective:    Patient ID: [name], male    DOB: 1946-02-12, 67 y.o.   MRN: [id number]  Abdominal Pain This is a recurrent problem. The current episode started more than 1 month ago. The onset quality is gradual. The problem occurs intermittently. The most recent episode lasted 1 hour. The problem has been waxing and waning. The pain is located in the left flank and LLQ. The pain is mild. The quality of the pain is colicky, a sensation of fullness and aching. The abdominal pain radiates to the left flank. Associated symptoms include belching and constipation. Pertinent negatives include no anorexia, diarrhea, dysuria, fever, flatus, headaches, hematochezia, hematuria, melena, myalgias, nausea, vomiting or weight loss. Nothing aggravates the pain. The pain is relieved by nothing. He has tried acetaminophen, antacids, H2 blockers and proton pump inhibitors for the symptoms. The treatment provided no relief. Prior diagnostic workup includes GI consult, surgery and upper endoscopy. His past medical history is significant for abdominal surgery and GERD. There is no history of colon cancer, Crohn's disease, gallstones, irritable bowel syndrome, pancreatitis, PUD or ulcerative colitis.    Past Medical History  Diagnosis Date  . Type II or unspecified type diabetes mellitus without mention of complication, not stated as uncontrolled   . Unspecified essential hypertension   . Esophageal reflux   . Gout   . OSA (obstructive sleep apnea)     cpap started 12/2010  . Arthritis   . Umbilical hernia   . Ventral hernia   . Coronary artery disease     PTCA/DES to LAD 06/28/12    Review of Systems  Constitutional: Negative for fever and weight loss.  Gastrointestinal: Positive for abdominal pain and constipation. Negative for nausea, vomiting, diarrhea, melena, hematochezia, anorexia and flatus.  Genitourinary: Negative for dysuria and hematuria.  Musculoskeletal: Negative for myalgias.   Neurological: Negative for headaches.       Objective:   Physical Exam BP 132/70  Pulse 76  Temp(Src) 99.1 F (37.3 C) (Oral)  Wt 247 lb 6.4 oz (112.22 kg)  BMI 37.63 kg/m2  SpO2 98% Wt Readings from Last 3 Encounters:  11/14/12 247 lb 6.4 oz (112.22 kg)  10/17/12 245 lb 12.8 oz (111.494 kg)  09/07/12 243 lb (110.224 kg)   Constitutional:  He is obese, but appears well-developed and well-nourished. No distress. Son and g-son at side Neck: Normal range of motion. Neck supple. No JVD present. No thyromegaly present.  Cardiovascular: Normal rate, regular rhythm and normal heart sounds.  No murmur heard. no BLE edema Pulmonary/Chest: Effort normal and breath sounds normal. No respiratory distress. no wheezes.  Abdominal: Obese, protuberant but soft. Bowel sounds are normal. There is no tenderness, rebound or gaurding.  Skin: Skin is warm and dry.  No erythema or ulceration.  Psychiatric: he has a normal mood and affect. behavior is normal. Judgment and thought content normal.   Lab Results  Component Value Date   WBC 4.1 10/24/2012   HGB 8.5* 10/24/2012   HCT 28.3* 10/24/2012   PLT 140* 10/24/2012   GLUCOSE 109* 08/16/2012   CHOL 168 05/28/2012   TRIG 198.0* 05/28/2012   HDL 29.60* 05/28/2012   LDLCALC 99 05/28/2012   ALT 14 08/13/2012   AST 20 08/13/2012   NA 137 08/16/2012   K 4.1 08/16/2012   CL 102 08/16/2012   CREATININE 0.80 08/16/2012   BUN 11 08/16/2012   CO2 25 08/16/2012   TSH 1.28 05/28/2012   INR 1.30 08/14/2012   HGBA1C  5.7 08/13/2012   ECG: sinus @ 71 bpm - no ischemic changes      Assessment & Plan:   L upper quadrant and flank pain - intermittent  Constipation CAD Hx gastric ulcer with GIB 07/2012  no evidence of cardiac abnormality on EKG Recent evaluation with GI reviewed, no evidence for recurrent bleeding ulcer, on medications as prescribed Prior CT October 2012 reviewed (pre hernia repair): small right renal stone, no gallbladder stone or  mass Recent labs unremarkable  Will treat for constipation - advised MiraLAX 17 g once daily Check CT abdomen pelvis with and without contrast No other medication changes recommended

## 2012-11-14 NOTE — Patient Instructions (Addendum)
It was good to see you today. We have reviewed your prior records including labs and tests today Start Miralax once daily - see medications we'll make referral for CT scan. Our office will contact you regarding appointment(s) once made. No other medication changes recommended  Constipation, Adult Constipation is when a person has fewer than 3 bowel movements a week; has difficulty having a bowel movement; or has stools that are dry, hard, or larger than normal. As people grow older, constipation is more common. If you try to fix constipation with medicines that make you have a bowel movement (laxatives), the problem may get worse. Long-term laxative use may cause the muscles of the colon to become weak. A low-fiber diet, not taking in enough fluids, and taking certain medicines may make constipation worse. CAUSES   Certain medicines, such as antidepressants, pain medicine, iron supplements, antacids, and water pills.   Certain diseases, such as diabetes, irritable bowel syndrome (IBS), thyroid disease, or depression.   Not drinking enough water.   Not eating enough fiber-rich foods.   Stress or travel.  Lack of physical activity or exercise.  Not going to the restroom when there is the urge to have a bowel movement.  Ignoring the urge to have a bowel movement.  Using laxatives too much. SYMPTOMS   Having fewer than 3 bowel movements a week.   Straining to have a bowel movement.   Having hard, dry, or larger than normal stools.   Feeling full or bloated.   Pain in the lower abdomen.  Not feeling relief after having a bowel movement. DIAGNOSIS  Your caregiver will take a medical history and perform a physical exam. Further testing may be done for severe constipation. Some tests may include:   A barium enema X-ray to examine your rectum, colon, and sometimes, your small intestine.  A sigmoidoscopy to examine your lower colon.  A colonoscopy to examine your entire  colon. TREATMENT  Treatment will depend on the severity of your constipation and what is causing it. Some dietary treatments include drinking more fluids and eating more fiber-rich foods. Lifestyle treatments may include regular exercise. If these diet and lifestyle recommendations do not help, your caregiver may recommend taking over-the-counter laxative medicines to help you have bowel movements. Prescription medicines may be prescribed if over-the-counter medicines do not work.  HOME CARE INSTRUCTIONS   Increase dietary fiber in your diet, such as fruits, vegetables, whole grains, and beans. Limit high-fat and processed sugars in your diet, such as [place] fries, hamburgers, cookies, candies, and soda.   A fiber supplement may be added to your diet if you cannot get enough fiber from foods.   Drink enough fluids to keep your urine clear or pale yellow.   Exercise regularly or as directed by your caregiver.   Go to the restroom when you have the urge to go. Do not hold it.  Only take medicines as directed by your caregiver. Do not take other medicines for constipation without talking to your caregiver first. SEEK IMMEDIATE MEDICAL CARE IF:   You have bright red blood in your stool.   Your constipation lasts for more than 4 days or gets worse.   You have abdominal or rectal pain.   You have thin, pencil-like stools.  You have unexplained weight loss. MAKE SURE YOU:   Understand these instructions.  Will watch your condition.  Will get help right away if you are not doing well or get worse. Document Released: 05/13/2004 Document Revised:  11/07/2011 Document Reviewed: 07/19/2011 [facility] Patient Information 2013 [place], [place].

## 2012-11-15 NOTE — Telephone Encounter (Signed)
Left messages with available numbers. We have been unable to reach patient to schedule appt. Cell number unable to leave message.  Needs Appt asap for 1 year

## 2012-11-15 NOTE — Telephone Encounter (Signed)
Spoke with patients spouse, patient scheduled to come in to see Dr. Yvetta Coder April 14 @ 1045. If paitent unable to keep this appt will call. Nothing further needed at this time.

## 2012-11-15 NOTE — Telephone Encounter (Signed)
Pt returned call.  He can be reached @ [phone]. [name]

## 2012-11-16 ENCOUNTER — Telehealth: Payer: Self-pay | Admitting: Internal Medicine

## 2012-11-16 ENCOUNTER — Telehealth: Payer: Self-pay | Admitting: *Deleted

## 2012-11-16 DIAGNOSIS — I10 Essential (primary) hypertension: Secondary | ICD-10-CM

## 2012-11-16 DIAGNOSIS — R109 Unspecified abdominal pain: Secondary | ICD-10-CM

## 2012-11-16 NOTE — Telephone Encounter (Signed)
Order entered - please let pt know same

## 2012-11-16 NOTE — Telephone Encounter (Signed)
Ok to change to [employer]

## 2012-11-16 NOTE — Telephone Encounter (Signed)
Called Rose no answer LMOM md response...Raechel Chute

## 2012-11-16 NOTE — Telephone Encounter (Signed)
Notified pt spoke with his son gave md response...lmb

## 2012-11-16 NOTE — Telephone Encounter (Signed)
They are wanting to clarify order. Per notes md trying to r/o stones. MD ordered CT/Abdomen. Would also need Pelvis if she is r/o stones. Pls advise...Raechel Chute

## 2012-11-20 ENCOUNTER — Other Ambulatory Visit (INDEPENDENT_AMBULATORY_CARE_PROVIDER_SITE_OTHER): Payer: Medicare Other

## 2012-11-20 DIAGNOSIS — I10 Essential (primary) hypertension: Secondary | ICD-10-CM

## 2012-11-20 DIAGNOSIS — R109 Unspecified abdominal pain: Secondary | ICD-10-CM

## 2012-11-20 LAB — BASIC METABOLIC PANEL
BUN: 16 mg/dL (ref 6–23)
CO2: 28 mEq/L (ref 19–32)
Calcium: 8.9 mg/dL (ref 8.4–10.5)
Creatinine, Ser: 0.7 mg/dL (ref 0.4–1.5)
Glucose, Bld: 109 mg/dL — ABNORMAL HIGH (ref 70–99)

## 2012-11-22 ENCOUNTER — Ambulatory Visit (INDEPENDENT_AMBULATORY_CARE_PROVIDER_SITE_OTHER)
Admission: RE | Admit: 2012-11-22 | Discharge: 2012-11-22 | Disposition: A | Discharge status: Home / Self Care | Payer: Medicare Other | Source: Ambulatory Visit | Attending: Internal Medicine | Admitting: Internal Medicine

## 2012-11-22 DIAGNOSIS — R1012 Left upper quadrant pain: Secondary | ICD-10-CM

## 2012-11-22 DIAGNOSIS — R079 Chest pain, unspecified: Secondary | ICD-10-CM

## 2012-11-22 DIAGNOSIS — K5900 Constipation, unspecified: Secondary | ICD-10-CM

## 2012-11-22 DIAGNOSIS — R109 Unspecified abdominal pain: Secondary | ICD-10-CM

## 2012-11-22 MED ORDER — IOHEXOL 300 MG/ML  SOLN
100.0000 mL | Freq: Once | INTRAMUSCULAR | Status: AC | PRN
  Administered 2012-11-22: 100 mL via INTRAVENOUS

## 2012-12-10 ENCOUNTER — Encounter: Payer: Self-pay | Admitting: Pulmonary Disease

## 2012-12-10 ENCOUNTER — Ambulatory Visit (INDEPENDENT_AMBULATORY_CARE_PROVIDER_SITE_OTHER): Payer: Medicare Other | Admitting: Pulmonary Disease

## 2012-12-10 VITALS — BP 120/60 | HR 71 | Temp 97.7°F | Ht 66.5 in | Wt 246.6 lb

## 2012-12-10 DIAGNOSIS — G4733 Obstructive sleep apnea (adult) (pediatric): Secondary | ICD-10-CM

## 2012-12-10 NOTE — Progress Notes (Signed)
  Subjective:    Patient ID: [name], male    DOB: 1945-10-29, 67 y.o.   MRN: [id number]  HPI Patient comes in today for followup of his moderate obstructive sleep apnea.  He has not been able to tolerate CPAP for many months, and does not feel this is a viable therapy for him.  He has lost almost 30 pounds since his last visit a year ago, and I have complimented him on this.  He feels that he sleeps well without a device, and has excellent daytime alertness.   Review of Systems  Constitutional: Negative for fever and unexpected weight change.  HENT: Positive for nosebleeds, congestion and rhinorrhea. Negative for ear pain, sore throat, sneezing, trouble swallowing, dental problem, postnasal drip and sinus pressure.   Eyes: Negative for redness and itching.  Respiratory: Negative for cough, chest tightness, shortness of breath and wheezing.   Cardiovascular: Positive for leg swelling. Negative for palpitations.  Gastrointestinal: Negative for nausea and vomiting.  Genitourinary: Negative for dysuria.  Musculoskeletal: Negative for joint swelling.  Skin: Negative for rash.  Neurological: Negative for headaches.  Hematological: Does not bruise/bleed easily.  Psychiatric/Behavioral: Positive for dysphoric mood. The patient is not nervous/anxious.        Objective:   Physical Exam Overweight male in no acute distress Nose without purulence or discharge noted No skin breakdown or pressure necrosis from the CPAP mask Lower extremities with minimal edema, no cyanosis Alert and oriented, moves all 4 extremities.  Does not appear to be sleepy.       Assessment & Plan:

## 2012-12-10 NOTE — Patient Instructions (Addendum)
Will discontinue cpap, but continue working on weight loss as you are doing. Let me know if you have worsening sleep or increased daytime sleepiness issues.

## 2012-12-10 NOTE — Assessment & Plan Note (Signed)
The patient has lost 30 pounds since his last visit, but has not been able to wear CPAP for months.  He feels this is not a viable therapy for him, and I have discussed other options for treatment.  After a long discussion, the patient would like to just continue working on further weight loss.  He is to call him he if he has worsening sleep or daytime sleepiness.

## 2012-12-11 ENCOUNTER — Emergency Department (INDEPENDENT_AMBULATORY_CARE_PROVIDER_SITE_OTHER)
Admission: EM | Admit: 2012-12-11 | Discharge: 2012-12-11 | Disposition: A | Discharge status: Home / Self Care | Payer: Medicare Other | Source: Home / Self Care | Attending: Emergency Medicine | Admitting: Emergency Medicine

## 2012-12-11 ENCOUNTER — Encounter (HOSPITAL_COMMUNITY): Payer: Self-pay | Admitting: Emergency Medicine

## 2012-12-11 DIAGNOSIS — H6122 Impacted cerumen, left ear: Secondary | ICD-10-CM

## 2012-12-11 DIAGNOSIS — H6120 Impacted cerumen, unspecified ear: Secondary | ICD-10-CM

## 2012-12-11 NOTE — ED Notes (Signed)
[name], emt has completed order for ear irrigation

## 2012-12-11 NOTE — ED Notes (Signed)
MD at bedside. 

## 2012-12-11 NOTE — ED Notes (Signed)
Left ear feels like wax is built up in ear.  Patient reports history of having wax removed from ears.  Patient has used peroxide in ear.  Ear issues for one month

## 2012-12-11 NOTE — ED Provider Notes (Signed)
History     CSN: [id number]  Arrival date & time 12/11/12  1002   First MD Initiated Contact with Patient 12/11/12 1007      Chief Complaint  Patient presents with  . Otalgia    (Consider location/radiation/quality/duration/timing/severity/associated sxs/prior treatment) HPI Comments: Patient presents to urgent care this morning describing that he has been feeling his left ear " occupied, and with pressure", been trying to irrigated with peroxide. He denies any injury trauma as drainage bleeding or decreased hearing from his left ear. Patient also denies any allergy related symptoms such as a runny congested nose sneezing coughing.  Patient is a 67 y.o. male presenting with ear pain. The history is provided by the patient.  Otalgia Location:  Left Quality:  Pressure Severity:  No pain Onset quality:  Gradual Timing:  Constant Progression:  Worsening Context: not direct blow, not elevation change, not foreign body in ear, not loud noise and no water in ear   Relieved by:  Nothing Associated symptoms: no congestion, no cough, no diarrhea, no ear discharge, no fever, no hearing loss, no neck pain, no rhinorrhea, no tinnitus and no vomiting   Risk factors: no recent travel and no prior ear surgery     Past Medical History  Diagnosis Date  . Type II or unspecified type diabetes mellitus without mention of complication, not stated as uncontrolled   . Unspecified essential hypertension   . Esophageal reflux   . Gout   . OSA (obstructive sleep apnea)     cpap started 12/2010  . Arthritis   . Umbilical hernia   . Ventral hernia   . Coronary artery disease     PTCA/DES to LAD 06/28/12    Past Surgical History  Procedure Laterality Date  . Appendectomy  04/2004  . Colon surgery    . Leg surgery    . Kidney stone surgery    . Ankle surgery    . Hernia repair  06/06/11    lap repair of incarcerated umbilical hernia with mesh  . Coronary angioplasty with stent placement   06/28/2012    DES   to LAD  . Esophagogastroduodenoscopy  08/15/2012    Procedure: ESOPHAGOGASTRODUODENOSCOPY (EGD);  Surgeon: Graylin Shiver, MD;  Location: [facility] ENDOSCOPY;  Service: Endoscopy;  Laterality: N/A;    Family History  Problem Relation Age of Onset  . Coronary artery disease    . Diabetes    . Colon cancer    . Lung cancer Sister   . Cancer Sister     lung  . Cancer Brother     lung  . Cancer Sister     lung    History  Substance Use Topics  . Smoking status: Former Smoker -- 2.00 packs/day for 40 years    Types: Cigarettes    Quit date: 08/29/1996  . Smokeless tobacco: Never Used  . Alcohol Use: No      Review of Systems  Constitutional: Negative for fever, chills, diaphoresis, activity change and appetite change.  HENT: Positive for ear pain. Negative for hearing loss, congestion, facial swelling, rhinorrhea, sneezing, neck pain, postnasal drip, tinnitus and ear discharge.   Respiratory: Negative for cough.   Gastrointestinal: Negative for vomiting and diarrhea.    Allergies  Review of patient's allergies indicates no known allergies.  Home Medications   Current Outpatient Rx  Name  Route  Sig  Dispense  Refill  . atorvastatin (LIPITOR) 80 MG tablet   Oral   Take 1  tablet (80 mg total) by mouth daily.   30 tablet   11   . furosemide (LASIX) 20 MG tablet      TAKE ONE TABLET BY MOUTH EVERY DAY   30 tablet   5   . losartan (COZAAR) 50 MG tablet   Oral   Take 1 tablet (50 mg total) by mouth daily.   30 tablet   5   . metFORMIN (GLUCOPHAGE) 500 MG tablet   Oral   Take 1 tablet (500 mg total) by mouth 2 (two) times daily with a meal. Hold until 06/30/2012.   180 tablet   2   . nitroGLYCERIN (NITROSTAT) 0.4 MG SL tablet   Sublingual   Place 1 tablet (0.4 mg total) under the tongue every 5 (five) minutes as needed for chest pain.   25 tablet   3   . omeprazole (PRILOSEC) 20 MG capsule   Oral   Take 20 mg by mouth daily.         .  polyethylene glycol powder (GLYCOLAX/MIRALAX) powder   Oral   Take 17 g by mouth daily.   3350 g   1   . prasugrel (EFFIENT) 10 MG TABS   Oral   Take 1 tablet (10 mg total) by mouth daily.   30 tablet   11     BP 110/65  Pulse 65  Temp(Src) 97.9 F (36.6 C) (Oral)  Resp 20  SpO2 96%  Physical Exam  ED Course  Procedures (including critical care time)  Labs Reviewed - No data to display No results found.   1. Excessive cerumen in ear canal, left       MDM  Patient post- irrigiation of left ear canal, with remarkable improvement of his symptomatic pressure. Examples irrigation revealed no ear canal abnormalities or eardrum erythema or swelling/bulging.        Jimmie Molly, MD 12/11/12 1057

## 2013-01-07 ENCOUNTER — Other Ambulatory Visit: Payer: Self-pay | Admitting: Internal Medicine

## 2013-01-11 ENCOUNTER — Telehealth: Payer: Self-pay | Admitting: Cardiovascular Disease

## 2013-01-11 NOTE — Telephone Encounter (Signed)
I would NOT recommend that we electively stop Effient or aspirin until 1 year after the stent placement.

## 2013-01-11 NOTE — Telephone Encounter (Signed)
Spoke with Carollee Herter at [facility] office. She is asking if pt can stop Effient prior to colonoscopy. Colonoscopy is currently scheduled for Jan 15, 2013. Pt saw Dr. Sherre Poot on Jan 07, 2013 and form requesting holding of Effient was sent to primary care. Per Carollee Herter she just learned today from primary care this needed to be addressed by cardiology.  Per Carollee Herter office note from visit with Dr. Sherre Poot states no active bleeding and colonoscopy is to evaluate iron deficiency anemia.  Pt had DES to LAD by Dr. Excell Seltzer on 06/28/12

## 2013-01-11 NOTE — Telephone Encounter (Signed)
Spoke with Carollee Herter and gave her information from Dr. Elease Hashimoto. Will fax this note to her attention at [phone]

## 2013-01-11 NOTE — Telephone Encounter (Signed)
New problem   Pt is having a colonoscopy 01/15/13 and Shannon/Dr Gammon faxed over a form today for Dr Elease Hashimoto to sign giving ok for the pt to stop Effient. They need the form back today so they know what to do for the pt. Please also call Shannon.

## 2013-01-11 NOTE — Telephone Encounter (Signed)
Left message for [name] to call back.

## 2013-01-14 ENCOUNTER — Ambulatory Visit (INDEPENDENT_AMBULATORY_CARE_PROVIDER_SITE_OTHER): Payer: Medicare Other | Admitting: Cardiovascular Disease

## 2013-01-14 ENCOUNTER — Telehealth: Payer: Self-pay | Admitting: Internal Medicine

## 2013-01-14 ENCOUNTER — Encounter: Payer: Self-pay | Admitting: Cardiovascular Disease

## 2013-01-14 VITALS — BP 124/64 | HR 74 | Ht 66.5 in | Wt 243.0 lb

## 2013-01-14 DIAGNOSIS — D62 Acute posthemorrhagic anemia: Secondary | ICD-10-CM

## 2013-01-14 DIAGNOSIS — M549 Dorsalgia, unspecified: Secondary | ICD-10-CM

## 2013-01-14 DIAGNOSIS — E785 Hyperlipidemia, unspecified: Secondary | ICD-10-CM

## 2013-01-14 DIAGNOSIS — I2510 Atherosclerotic heart disease of native coronary artery without angina pectoris: Secondary | ICD-10-CM

## 2013-01-14 MED ORDER — FUROSEMIDE 20 MG PO TABS: 40.0000 mg | ORAL_TABLET | Freq: Every day | ORAL | Status: DC

## 2013-01-14 NOTE — Telephone Encounter (Signed)
Tell arriva I will not sign these orders for back brace/heating pad as not appropriate for same- pt and i have not discussed back pain I will refer to PT to eval for back pain and any equipment needs -

## 2013-01-14 NOTE — Progress Notes (Signed)
[name] Date of Birth  [birth year]       [facility]    [place] [address]  [address] [place], [place]  [address]   [place], [place]  [address] [phone]     [phone]   Fax  [phone]    Fax [phone]  Problem List: 1. CAD -  3.0 x 28 mm drug-eluting stent 06-28-12 2. Indigestion 3. Anemia   History of Present Illness:  [name] is a 67 yo with hx of indigestion / chest pain  that develops with walking.  The pain is a "ache" and lasts as long as he is walking.   It causes him some dyspnea.  It typically goes away when he stops.  He has lots of belching.  Hx of asthma. Hx of smoking in the past.  No regular exercise.  He is now retired.  He gets a little exercise every day.  He had a dobutamine echo that revealed mild ischemia in the inferior wall.    He had a cardiac cath which revealed a tight irregular LAD stenosis. He had PTCA and stenting of his LAD by Dr. Excell Seltzer.  We have advised him to stop  eating fried foods. He still is eating fried potatoes on a regular basis.   Jan 14, 2013:  [name] has done well from a cardiac standpoint. He is having some problems with anemia. He was scheduled have a colonoscopy tomorrow but I did not want to stop the Effient  and aspirin. The colonoscopy was canceled.  Current Outpatient Prescriptions on File Prior to Visit  Medication Sig Dispense Refill  . atorvastatin (LIPITOR) 80 MG tablet Take 1 tablet (80 mg total) by mouth daily.  30 tablet  11  . furosemide (LASIX) 20 MG tablet TAKE ONE TABLET BY MOUTH EVERY DAY  30 tablet  5  . losartan (COZAAR) 50 MG tablet TAKE ONE TABLET BY MOUTH EVERY DAY  30 tablet  5  . metFORMIN (GLUCOPHAGE) 500 MG tablet Take 1 tablet (500 mg total) by mouth 2 (two) times daily with a meal. Hold until 06/30/2012.  180 tablet  2  . nitroGLYCERIN (NITROSTAT) 0.4 MG SL tablet Place 1 tablet (0.4 mg total) under the tongue every 5 (five) minutes as  needed for chest pain.  25 tablet  3  . omeprazole (PRILOSEC) 20 MG capsule Take 20 mg by mouth daily.      . polyethylene glycol powder (GLYCOLAX/MIRALAX) powder Take 17 g by mouth daily.  3350 g  1  . prasugrel (EFFIENT) 10 MG TABS Take 1 tablet (10 mg total) by mouth daily.  30 tablet  11   No current facility-administered medications on file prior to visit.    No Known Allergies  Past Medical History  Diagnosis Date  . Type II or unspecified type diabetes mellitus without mention of complication, not stated as uncontrolled   . Unspecified essential hypertension   . Esophageal reflux   . Gout   . OSA (obstructive sleep apnea)     cpap started 12/2010  . Arthritis   . Umbilical hernia   . Ventral hernia   . Coronary artery disease     PTCA/DES to LAD 06/28/12    Past Surgical History  Procedure Laterality Date  . Appendectomy  04/2004  . Colon surgery    . Leg surgery    . Kidney stone surgery    . Ankle surgery    .  Hernia repair  06/06/11    lap repair of incarcerated umbilical hernia with mesh  . Coronary angioplasty with stent placement  06/28/2012    DES   to LAD  . Esophagogastroduodenoscopy  08/15/2012    Procedure: ESOPHAGOGASTRODUODENOSCOPY (EGD);  Surgeon: Graylin Shiver, MD;  Location: [facility] ENDOSCOPY;  Service: Endoscopy;  Laterality: N/A;    History  Smoking status  . Former Smoker -- 2.00 packs/day for 40 years  . Types: Cigarettes  . Quit date: 08/29/1996  Smokeless tobacco  . Never Used    History  Alcohol Use No    Family History  Problem Relation Age of Onset  . Coronary artery disease    . Diabetes    . Colon cancer    . Lung cancer Sister   . Cancer Sister     lung  . Cancer Brother     lung  . Cancer Sister     lung    Reviw of Systems:  Reviewed in the HPI.  All other systems are negative.  Physical Exam: Blood pressure 124/64, pulse 74, height 5' 6.5" (1.689 m), weight 243 lb (110.224 kg), SpO2 98.00%. General: Well developed,  well nourished, in no acute distress.  Head: Normocephalic, atraumatic, sclera non-icteric, mucus membranes are moist,   Neck: Supple. Carotids are 2 + without bruits. No JVD  Lungs: Clear bilaterally to auscultation.  Few rhonchi with cough  Heart: regular rate.  normal  S1 S2. No murmurs, gallops or rubs.  Abdomen: Soft, non-tender, obese.  Exogenous obesity - difficult to access femoral arteries.  Msk:  Strength and tone are normal  Extremities: No clubbing or cyanosis. 1-2 +  edema.  Distal pedal pulses are 2+ and equal bilaterally.  Neuro: Alert and oriented X 3. Moves all extremities spontaneously.  Psych:  Responds to questions appropriately with a normal affect.  ECG: Oct. 25, 2013 - NSR at 71, no ST or T wave changes  Assessment / Plan:

## 2013-01-14 NOTE — Assessment & Plan Note (Signed)
Nichols is doing well from a cardiac standpoint. She had any episodes of chest pain.  Fasting labs tomorrow   He status post 06/28/2012. Ideally, we would continue with the aspirin and Effient for one year after the stent placement.   If he develops life-threatening bleeding then of course the Effient would need to be stopped but this would put him at increased risk for stent thrombosis and MI.Marland Kitchen  He is having some leg edema.  Will increase his Lasix to 40 daily.  Check BMP in 1 month.  OV in 6 months OV , fasting labs.

## 2013-01-14 NOTE — Telephone Encounter (Signed)
Notified [facility] spoke with donna gave md response...lmb

## 2013-01-14 NOTE — Assessment & Plan Note (Signed)
She presents today for followup of his coronary artery disease. He had a drug-eluting stent placed  June 28, 2012.  Procedure request is discontinued EF he had an aspirin prior to a colonoscopy. Since he is still not a year out from his stent, I would not want to electively discontinue the Effient.  He did if he develops serious, life-threatening bleeding, then we will need to discontinue Effient.  It may be possible for the gastroenterologist to  performed a colonoscopy while he is on aspirin and Effient  But not take any biopsies or remove any polyps.

## 2013-01-14 NOTE — Telephone Encounter (Signed)
Wynona Canes is calling from [facility] to check the status of the patients request for a back brace and heating pad, you can call in with questions at [phone] using the reference number [id number]

## 2013-01-14 NOTE — Patient Instructions (Signed)
Your physician wants you to follow-up in: 6 months  You will receive a reminder letter in the mail two months in advance. If you don't receive a letter, please call our office to schedule the follow-up appointment.  Your physician recommends that you return for lab work in: tomorrow, bmet lipid hepatic   Your physician has recommended you make the following change in your medication:   Increase lasix to 40 mg daily  Your physician recommends that you return for lab work in: 1 month bmet

## 2013-01-15 ENCOUNTER — Other Ambulatory Visit (INDEPENDENT_AMBULATORY_CARE_PROVIDER_SITE_OTHER): Payer: Medicare Other

## 2013-01-15 DIAGNOSIS — E785 Hyperlipidemia, unspecified: Secondary | ICD-10-CM

## 2013-01-15 DIAGNOSIS — I2510 Atherosclerotic heart disease of native coronary artery without angina pectoris: Secondary | ICD-10-CM

## 2013-01-15 LAB — HEPATIC FUNCTION PANEL
Albumin: 3.8 g/dL (ref 3.5–5.2)
Alkaline Phosphatase: 112 U/L (ref 39–117)
Total Protein: 7.9 g/dL (ref 6.0–8.3)

## 2013-01-15 LAB — LIPID PANEL
Cholesterol: 91 mg/dL (ref 0–200)
HDL: 30.3 mg/dL — ABNORMAL LOW (ref >39.00)
Triglycerides: 55 mg/dL (ref 0.0–149.0)

## 2013-01-15 LAB — BASIC METABOLIC PANEL
BUN: 14 mg/dL (ref 6–23)
CO2: 29 mEq/L (ref 19–32)
Calcium: 9 mg/dL (ref 8.4–10.5)
Glucose, Bld: 99 mg/dL (ref 70–99)
Sodium: 140 mEq/L (ref 135–145)

## 2013-01-25 ENCOUNTER — Ambulatory Visit: Payer: Medicare Other | Attending: Internal Medicine | Admitting: Physical Therapy

## 2013-01-25 DIAGNOSIS — M545 Low back pain, unspecified: Secondary | ICD-10-CM | POA: Insufficient documentation

## 2013-01-25 DIAGNOSIS — IMO0001 Reserved for inherently not codable concepts without codable children: Secondary | ICD-10-CM | POA: Insufficient documentation

## 2013-01-31 ENCOUNTER — Ambulatory Visit: Payer: Medicare Other | Attending: Internal Medicine | Admitting: Physical Therapy

## 2013-01-31 DIAGNOSIS — M545 Low back pain, unspecified: Secondary | ICD-10-CM | POA: Insufficient documentation

## 2013-01-31 DIAGNOSIS — IMO0001 Reserved for inherently not codable concepts without codable children: Secondary | ICD-10-CM | POA: Insufficient documentation

## 2013-02-06 ENCOUNTER — Ambulatory Visit: Payer: Medicare Other | Admitting: Physical Therapy

## 2013-02-11 ENCOUNTER — Ambulatory Visit: Payer: Medicare Other | Admitting: Rehabilitation

## 2013-02-14 ENCOUNTER — Other Ambulatory Visit (INDEPENDENT_AMBULATORY_CARE_PROVIDER_SITE_OTHER): Payer: Medicare Other

## 2013-02-14 ENCOUNTER — Ambulatory Visit: Payer: Medicare Other | Admitting: Rehabilitation

## 2013-02-14 DIAGNOSIS — E785 Hyperlipidemia, unspecified: Secondary | ICD-10-CM

## 2013-02-14 DIAGNOSIS — I2510 Atherosclerotic heart disease of native coronary artery without angina pectoris: Secondary | ICD-10-CM

## 2013-02-14 LAB — BASIC METABOLIC PANEL
BUN: 16 mg/dL (ref 6–23)
Calcium: 8.7 mg/dL (ref 8.4–10.5)
Chloride: 107 mEq/L (ref 96–112)
Creatinine, Ser: 0.7 mg/dL (ref 0.4–1.5)

## 2013-02-18 ENCOUNTER — Ambulatory Visit: Payer: Medicare Other

## 2013-02-20 ENCOUNTER — Ambulatory Visit: Payer: Medicare Other | Admitting: Internal Medicine

## 2013-02-20 DIAGNOSIS — Z0289 Encounter for other administrative examinations: Secondary | ICD-10-CM

## 2013-02-21 ENCOUNTER — Encounter (HOSPITAL_COMMUNITY): Payer: Self-pay | Admitting: *Deleted

## 2013-02-21 ENCOUNTER — Emergency Department (HOSPITAL_COMMUNITY): Payer: Medicare Other

## 2013-02-21 ENCOUNTER — Emergency Department (HOSPITAL_COMMUNITY)
Admission: EM | Admit: 2013-02-21 | Discharge: 2013-02-21 | Disposition: A | Discharge status: Home / Self Care | Payer: Medicare Other | Attending: Emergency Medicine | Admitting: Emergency Medicine

## 2013-02-21 DIAGNOSIS — Z7902 Long term (current) use of antithrombotics/antiplatelets: Secondary | ICD-10-CM | POA: Insufficient documentation

## 2013-02-21 DIAGNOSIS — R5381 Other malaise: Secondary | ICD-10-CM | POA: Insufficient documentation

## 2013-02-21 DIAGNOSIS — Z79899 Other long term (current) drug therapy: Secondary | ICD-10-CM | POA: Insufficient documentation

## 2013-02-21 DIAGNOSIS — Z8639 Personal history of other endocrine, nutritional and metabolic disease: Secondary | ICD-10-CM | POA: Insufficient documentation

## 2013-02-21 DIAGNOSIS — E119 Type 2 diabetes mellitus without complications: Secondary | ICD-10-CM | POA: Insufficient documentation

## 2013-02-21 DIAGNOSIS — I10 Essential (primary) hypertension: Secondary | ICD-10-CM | POA: Insufficient documentation

## 2013-02-21 DIAGNOSIS — Z862 Personal history of diseases of the blood and blood-forming organs and certain disorders involving the immune mechanism: Secondary | ICD-10-CM | POA: Insufficient documentation

## 2013-02-21 DIAGNOSIS — Z8719 Personal history of other diseases of the digestive system: Secondary | ICD-10-CM | POA: Insufficient documentation

## 2013-02-21 DIAGNOSIS — R5383 Other fatigue: Secondary | ICD-10-CM | POA: Insufficient documentation

## 2013-02-21 DIAGNOSIS — M129 Arthropathy, unspecified: Secondary | ICD-10-CM | POA: Insufficient documentation

## 2013-02-21 DIAGNOSIS — G4733 Obstructive sleep apnea (adult) (pediatric): Secondary | ICD-10-CM | POA: Insufficient documentation

## 2013-02-21 DIAGNOSIS — R079 Chest pain, unspecified: Secondary | ICD-10-CM | POA: Insufficient documentation

## 2013-02-21 DIAGNOSIS — R531 Weakness: Secondary | ICD-10-CM

## 2013-02-21 DIAGNOSIS — Z87891 Personal history of nicotine dependence: Secondary | ICD-10-CM | POA: Insufficient documentation

## 2013-02-21 DIAGNOSIS — K219 Gastro-esophageal reflux disease without esophagitis: Secondary | ICD-10-CM | POA: Insufficient documentation

## 2013-02-21 DIAGNOSIS — Z9861 Coronary angioplasty status: Secondary | ICD-10-CM | POA: Insufficient documentation

## 2013-02-21 DIAGNOSIS — I2510 Atherosclerotic heart disease of native coronary artery without angina pectoris: Secondary | ICD-10-CM | POA: Insufficient documentation

## 2013-02-21 LAB — COMPREHENSIVE METABOLIC PANEL
BUN: 17 mg/dL (ref 6–23)
CO2: 25 mEq/L (ref 19–32)
Chloride: 104 mEq/L (ref 96–112)
Creatinine, Ser: 0.78 mg/dL (ref 0.50–1.35)
GFR calc non Af Amer: >90 mL/min (ref >90)
Glucose, Bld: 99 mg/dL (ref 70–99)
Total Bilirubin: 0.3 mg/dL (ref 0.3–1.2)

## 2013-02-21 LAB — CBC
HCT: 35.4 % — ABNORMAL LOW (ref 39.0–52.0)
MCH: 27.7 pg (ref 26.0–34.0)
MCHC: 33.1 g/dL (ref 30.0–36.0)
MCV: 83.7 fL (ref 78.0–100.0)
RDW: 17.5 % — ABNORMAL HIGH (ref 11.5–15.5)

## 2013-02-21 LAB — TYPE AND SCREEN
ABO/RH(D): O POS
Antibody Screen: NEGATIVE

## 2013-02-21 NOTE — ED Notes (Signed)
Per pt and family, pt was feeling ok this am when he got up then had onset of dizziness and near syncope at 0930. Reports also having cp and back pain. No acute distress is noted at triage. Respe/u.

## 2013-02-21 NOTE — ED Provider Notes (Signed)
History    CSN: [id number] Arrival date & time 02/21/13  1537  First MD Initiated Contact with Patient 02/21/13 1625     Chief Complaint  Patient presents with  . Dizziness   (Consider location/radiation/quality/duration/timing/severity/associated sxs/prior Treatment) HPI  Patient presents today complaining of lightheadedness and dizziness. He states it initially he felt like he was very lightheaded but then had some difficulty with his balance when he attempted to walk. He has a known history of coronary artery disease and states she's been having chest pain intermittently for 2 months. This has not recurred today and has not worsened it has been in the past. He had a drug-eluting stent placed in October of 2013. He has had iron deficiency anemia and has been seen by GI. They had planned to do a colonoscopy but have been unable to discontinue his efflent and aspirin as cardiology has not cleared him to have these discontinued unless he has life-threatening bleeding. He has been taking iron in his anemia has improved. He denies any headache, dyspnea, nausea, vomiting, hematemesis, diarrhea, or change in the color of his stool which has been dark but no blood has been seen. He is taking iron. Past Medical History  Diagnosis Date  . Type II or unspecified type diabetes mellitus without mention of complication, not stated as uncontrolled   . Unspecified essential hypertension   . Esophageal reflux   . Gout   . OSA (obstructive sleep apnea)     cpap started 12/2010  . Arthritis   . Umbilical hernia   . Ventral hernia   . Coronary artery disease     PTCA/DES to LAD 06/28/12   Past Surgical History  Procedure Laterality Date  . Appendectomy  04/2004  . Colon surgery    . Leg surgery    . Kidney stone surgery    . Ankle surgery    . Hernia repair  06/06/11    lap repair of incarcerated umbilical hernia with mesh  . Coronary angioplasty with stent placement  06/28/2012    DES   to LAD   . Esophagogastroduodenoscopy  08/15/2012    Procedure: ESOPHAGOGASTRODUODENOSCOPY (EGD);  Surgeon: Graylin Shiver, MD;  Location: [facility] ENDOSCOPY;  Service: Endoscopy;  Laterality: N/A;   Family History  Problem Relation Age of Onset  . Coronary artery disease    . Diabetes    . Colon cancer    . Lung cancer Sister   . Cancer Sister     lung  . Cancer Brother     lung  . Cancer Sister     lung   History  Substance Use Topics  . Smoking status: Former Smoker -- 2.00 packs/day for 40 years    Types: Cigarettes    Quit date: 08/29/1996  . Smokeless tobacco: Never Used  . Alcohol Use: No    Review of Systems  All other systems reviewed and are negative.    Allergies  Review of patient's allergies indicates no known allergies.  Home Medications   Current Outpatient Rx  Name  Route  Sig  Dispense  Refill  . atorvastatin (LIPITOR) 80 MG tablet   Oral   Take 1 tablet (80 mg total) by mouth daily.   30 tablet   11   . ferrous sulfate 325 (65 FE) MG tablet   Oral   Take 325 mg by mouth 2 (two) times daily.         . furosemide (LASIX) 20 MG tablet  Oral   Take 2 tablets (40 mg total) by mouth daily.   60 tablet   5     Increased dose   . losartan (COZAAR) 50 MG tablet   Oral   Take 50 mg by mouth daily.         . metFORMIN (GLUCOPHAGE) 500 MG tablet   Oral   Take 500 mg by mouth 2 (two) times daily with a meal.         . nitroGLYCERIN (NITROSTAT) 0.4 MG SL tablet   Sublingual   Place 1 tablet (0.4 mg total) under the tongue every 5 (five) minutes as needed for chest pain.   25 tablet   3   . omeprazole (PRILOSEC OTC) 20 MG tablet   Oral   Take 20 mg by mouth daily.         . prasugrel (EFFIENT) 10 MG TABS   Oral   Take 1 tablet (10 mg total) by mouth daily.   30 tablet   11    BP 115/61  Pulse 58  Temp(Src) 98.5 F (36.9 C) (Oral)  Resp 22  SpO2 98% Physical Exam  Nursing note and vitals reviewed. Constitutional: He is oriented to  person, place, and time. He appears well-developed and well-nourished.  Unkempt  HENT:  Head: Normocephalic and atraumatic.  Right Ear: External ear normal.  Left Ear: External ear normal.  Nose: Nose normal.  Mouth/Throat: Oropharynx is clear and moist.  Eyes: Conjunctivae and EOM are normal. Pupils are equal, round, and reactive to light.  Neck: Normal range of motion.  Cardiovascular: Normal rate, regular rhythm, normal heart sounds and intact distal pulses.   Pulmonary/Chest: Effort normal and breath sounds normal.  Abdominal: Soft.  Musculoskeletal: Normal range of motion.  Neurological: He is alert and oriented to person, place, and time. He has normal strength and normal reflexes. No sensory deficit. He displays a negative Romberg sign. GCS eye subscore is 4. GCS verbal subscore is 5. GCS motor subscore is 6.  Cranial nerves II through XII appear grossly  Skin: Skin is warm and dry.  Psychiatric: He has a normal mood and affect. His behavior is normal. Thought content normal.    ED Course  Procedures (including critical care time) Labs Reviewed  CBC - Abnormal; Notable for the following:    Hemoglobin 11.7 (*)    HCT 35.4 (*)    RDW 17.5 (*)    Platelets 118 (*)    All other components within normal limits  COMPREHENSIVE METABOLIC PANEL - Abnormal; Notable for the following:    Alkaline Phosphatase 139 (*)    All other components within normal limits  OCCULT BLOOD, POC DEVICE - Abnormal; Notable for the following:    Fecal Occult Bld POSITIVE (*)    All other components within normal limits  GLUCOSE, CAPILLARY  OCCULT BLOOD X 1 CARD TO LAB, STOOL  POCT I-STAT TROPONIN I  TYPE AND SCREEN   Dg Chest 1 View  02/21/2013   *RADIOLOGY REPORT*  Clinical Data: Episodes of dizziness and weakness today, shortness of breath when sitting up, history hypertension, diabetes, coronary artery disease  CHEST - 1 VIEW  Comparison: 06/02/2011  Findings: Lordotic positioning. Enlargement  of cardiac silhouette with pulmonary vascular congestion. No acute infiltrate, pleural effusion or pneumothorax. Numerous EKG leads project over chest. No acute osseous findings.  IMPRESSION: Minimal enlargement of cardiac silhouette with pulmonary vascular congestion which may be related to technique. No acute infiltrate.   Original  Report Authenticated By: Ulyses Southward, M.D.   Ct Head Wo Contrast  02/21/2013   *RADIOLOGY REPORT*  Clinical Data: Dizziness, near-syncope  CT HEAD WITHOUT CONTRAST  Technique:  Contiguous axial images were obtained from the base of the skull through the vertex without contrast.  Comparison: None.  Findings: No acute intracranial hemorrhage, acute infarction, mass lesion, mass effect, midline shift or hydrocephalus.  Gray-white differentiation is preserved throughout.  Mild atrophy.  Mild periventricular, subcortical and deep white matter hypoattenuation is nonspecific but most consistent with the sequela of longstanding microvascular ischemia.  Focal hypoattenuation in the inferior left basal ganglia may reflect a dilated perivascular space or remote lacunar infarct.  The globes are intact.  The bilateral orbits are symmetric and unremarkable.  No focal soft tissue abnormality.  No acute calvarial abnormality.  Unremarkable aeration of the mastoid air cells and visualized paranasal sinuses.  Atherosclerotic vascular calcifications throughout the bilateral cavernous carotid arteries.  IMPRESSION:  1.  No acute intracranial abnormality. 2.  Cerebral atrophy and probable chronic ischemic white matter disease. 3.  Intracranial atherosclerosis.   Original Report Authenticated By: Malachy Moan, M.D.   No diagnosis found.  Date: 02/21/2013  Rate: 68  Rhythm: normal sinus rhythm  QRS Axis: right  Intervals: normal  ST/T Wave abnormalities: normal  Conduction Disutrbances:incomplete rbbb  Narrative Interpretation:   Old EKG Reviewed: unchanged from 11/14/12   MDM   Filed  Vitals:   02/21/13 1828  BP: 115/61  Pulse: 58  Temp: 98.5 F (36.9 C)  Resp: 22   Results for orders placed during the hospital encounter of 02/21/13  CBC      Result Value Range   WBC 4.1  4.0 - 10.5 K/uL   RBC 4.23  4.22 - 5.81 MIL/uL   Hemoglobin 11.7 (*) 13.0 - 17.0 g/dL   HCT 47.8 (*) 29.5 - 62.1 %   MCV 83.7  78.0 - 100.0 fL   MCH 27.7  26.0 - 34.0 pg   MCHC 33.1  30.0 - 36.0 g/dL   RDW 30.8 (*) 65.7 - 84.6 %   Platelets 118 (*) 150 - 400 K/uL  GLUCOSE, CAPILLARY      Result Value Range   Glucose-Capillary 85  70 - 99 mg/dL   Comment 1 Documented in Chart     Comment 2 Notify RN    COMPREHENSIVE METABOLIC PANEL      Result Value Range   Sodium 139  135 - 145 mEq/L   Potassium 4.0  3.5 - 5.1 mEq/L   Chloride 104  96 - 112 mEq/L   CO2 25  19 - 32 mEq/L   Glucose, Bld 99  70 - 99 mg/dL   BUN 17  6 - 23 mg/dL   Creatinine, Ser 9.62  0.50 - 1.35 mg/dL   Calcium 8.9  8.4 - 95.2 mg/dL   Total Protein 7.2  6.0 - 8.3 g/dL   Albumin 3.6  3.5 - 5.2 g/dL   AST 23  0 - 37 U/L   ALT 17  0 - 53 U/L   Alkaline Phosphatase 139 (*) 39 - 117 U/L   Total Bilirubin 0.3  0.3 - 1.2 mg/dL   GFR calc non Af Amer >90  >90 mL/min   GFR calc Af Amer >90  >90 mL/min  POCT I-STAT TROPONIN I      Result Value Range   Troponin i, poc 0.00  0.00 - 0.08 ng/mL   Comment 3  OCCULT BLOOD, POC DEVICE      Result Value Range   Fecal Occult Bld POSITIVE (*) NEGATIVE  TYPE AND SCREEN      Result Value Range   ABO/RH(D) O POS     Antibody Screen NEG     Sample Expiration 02/24/2013       Patient with normal work up except trace heme positive stool which was dark when obtained but no gross blood.  HE has not had a bowel movement here.  He has remained hemodynamically stable and has known anemia which is improved today from prior of 8.5,now 11.7.  EKG is stable andpatient does not have chest pain or dyspnea.  He has a normal neuro exam and has ambulated without difficulty.  He is  discharged with strict return precautions and will follow up with his pmd and return if worse at any time.    Hilario Quarry, MD 02/21/13 2014

## 2013-02-21 NOTE — ED Notes (Signed)
CBG 85

## 2013-03-13 ENCOUNTER — Other Ambulatory Visit (INDEPENDENT_AMBULATORY_CARE_PROVIDER_SITE_OTHER): Payer: Medicare Other

## 2013-03-13 ENCOUNTER — Ambulatory Visit (INDEPENDENT_AMBULATORY_CARE_PROVIDER_SITE_OTHER): Payer: Medicare Other | Admitting: Internal Medicine

## 2013-03-13 ENCOUNTER — Encounter: Payer: Self-pay | Admitting: Internal Medicine

## 2013-03-13 VITALS — BP 110/70 | HR 71 | Temp 97.9°F | Wt 237.4 lb

## 2013-03-13 DIAGNOSIS — K253 Acute gastric ulcer without hemorrhage or perforation: Secondary | ICD-10-CM

## 2013-03-13 DIAGNOSIS — N471 Phimosis: Secondary | ICD-10-CM

## 2013-03-13 DIAGNOSIS — N478 Other disorders of prepuce: Secondary | ICD-10-CM

## 2013-03-13 DIAGNOSIS — I10 Essential (primary) hypertension: Secondary | ICD-10-CM

## 2013-03-13 DIAGNOSIS — D62 Acute posthemorrhagic anemia: Secondary | ICD-10-CM

## 2013-03-13 DIAGNOSIS — M545 Low back pain, unspecified: Secondary | ICD-10-CM

## 2013-03-13 DIAGNOSIS — E119 Type 2 diabetes mellitus without complications: Secondary | ICD-10-CM

## 2013-03-13 LAB — MICROALBUMIN / CREATININE URINE RATIO
Creatinine,U: 76.1 mg/dL
Microalb Creat Ratio: 1.1 mg/g (ref 0.0–30.0)
Microalb, Ur: 0.8 mg/dL (ref 0.0–1.9)

## 2013-03-13 NOTE — Assessment & Plan Note (Signed)
Prior pain has resolved - attributed to ulcers as seen on EGD 08/15/12 PPI x6 mo, has changed to Tums 02/2013 for cost ABL anemia from same as stabilized

## 2013-03-13 NOTE — Patient Instructions (Signed)
It was good to see you today. We have reviewed your prior records including labs and tests today Test(s) ordered today. Your results will be released to MyChart (or called to you) after review, usually within 72hours after test completion. If any changes need to be made, you will be notified at that same time. we'll make referral to urologist and eye doctor specialist. Our office will contact you regarding appointment(s) once made. Medications reviewed and updated, no changes recommended at this time. Please schedule followup in 6 months, call sooner if problems.

## 2013-03-13 NOTE — Assessment & Plan Note (Signed)
On metformin for same since 07/2010- also ARB and statin  Lab Results  Component Value Date   HGBA1C 5.7 08/13/2012

## 2013-03-13 NOTE — Assessment & Plan Note (Signed)
BP Readings from Last 3 Encounters:  03/13/13 110/70  02/21/13 115/61  01/14/13 124/64   On ARB since 07/2010 - The current medical regimen is effective;  continue present plan and medications.

## 2013-03-13 NOTE — Progress Notes (Signed)
Subjective:    Patient ID: [name], male    DOB: [birth year], 67 y.o.   MRN: [id number]  HPI  Here for follow up - reviewed chronic medical issues  Hx GIB with iron def and ABL anemia -  Denies BRBPR, nausea, epigastric pain, bowel changes, melena 08/15/12: EGD: several small antral ulcers and erosions with no apparent active bleed Stopped aspirin as instructed person and December 17 but continues on Effient for hx cardiac stent 06/28/2012  CAD - s/p PTCA/DES to LAD 06/28/12 - after procedure new pain located in epigastric and SS chest related to gastric/antal ulcer (see hosp 07/2012 for same), now resolved - taking all meds as rx'd after cath/DES  OSA - working with pulm and started CPAP for same 12/2010 - variable compliance -  DM2 - dx 2005  - on metformin since 07/2010- checks sugars 1-2x/day -the patient reports compliance with medication(s) as prescribed. Denies adverse side effects.  hypertension - on ARB since 07/2010 - denies chest pain or headache but chronic edema BLE without change-   EtOH abuse - reports cutting back on use > currently reports 1-2 beer/day - denies WD symptoms or prior DTs - no known liver complications or hx legal/employer issues with alcohol   gout - no swelling or recent trauma/injury; +hx prior flares - improved with self-tx cherry juice     Past Medical History  Diagnosis Date  . Type II or unspecified type diabetes mellitus without mention of complication, not stated as uncontrolled   . Unspecified essential hypertension   . Esophageal reflux   . Gout   . OSA (obstructive sleep apnea)     cpap started 12/2010  . Arthritis   . Umbilical hernia   . Ventral hernia   . Coronary artery disease     PTCA/DES to LAD 06/28/12    Review of Systems  Constitutional: Negative for fever and unexpected weight change.  Respiratory: Negative for cough, shortness of breath and wheezing.   Cardiovascular: Negative for palpitations and leg swelling.   Gastrointestinal: Negative for abdominal pain, abdominal distention and anal bleeding.  Musculoskeletal: Positive for back pain (chronic).       Objective:   Physical Exam  BP 110/70  Pulse 71  Temp(Src) 97.9 F (36.6 C) (Oral)  Wt 237 lb 6.4 oz (107.684 kg)  BMI 37.75 kg/m2  SpO2 97% Wt Readings from Last 3 Encounters:  03/13/13 237 lb 6.4 oz (107.684 kg)  01/14/13 243 lb (110.224 kg)  12/10/12 246 lb 9.6 oz (111.857 kg)   Constitutional:  Obese; appears well-developed and well-nourished. No distress. son at side - generalized poor hygiene Neck: Normal range of motion. Thick. Neck supple. No JVD present. No thyromegaly present.  Cardiovascular: Normal rate, regular rhythm and normal heart sounds.  No murmur heard. 1+ pitting BLE edema to below knee Pulmonary/Chest: Effort normal and breath sounds normal. No respiratory distress. no wheezes.  Abdominal: SNTND, bowel sounds are normal. There is no tenderness, r/g. resolved umbilical hernia, no recurrence MSkel: Back: full range of motion of thoracic and lumbar spine. Non tender to palpation. Negative straight leg raise. DTR's are symmetrically intact. Sensation intact in all dermatomes of the lower extremities. Full strength to manual muscle testing. patient is able to heel toe walk without difficulty and ambulates with antalgic gait. GU: deferred Psychiatric: he has a normal mood and affect. behavior is normal. Judgment and thought content normal.   Lab Results  Component Value Date   WBC 4.1 02/21/2013  HGB 11.7* 02/21/2013   HCT 35.4* 02/21/2013   PLT 118* 02/21/2013   CHOL 91 01/15/2013   TRIG 55.0 01/15/2013   HDL 30.30* 01/15/2013   ALT 17 02/21/2013   AST 23 02/21/2013   NA 139 02/21/2013   K 4.0 02/21/2013   CL 104 02/21/2013   CREATININE 0.78 02/21/2013   BUN 17 02/21/2013   CO2 25 02/21/2013   TSH 1.28 05/28/2012   INR 1.30 08/14/2012   HGBA1C 5.7 08/13/2012        Assessment & Plan:   see problem list. Medications  and labs reviewed today.  Back pain, lumbago - s/p PT without improvement - exam benign - will sign for back brace as requested  Phimosis, reported, not examined - refer to uro for further eval/tx

## 2013-03-13 NOTE — Assessment & Plan Note (Signed)
EGD 12/18 13 revealing several small antral ulcers and erosions with no apparent active bleed he was initially placed on IV PPI and his aspirin and antiplatelet medications were held, thereafter his H&H stabilized transitioned to oral PPI  -  Recurrent ABL anemia on follow up - s/p outpatient 2 unit transfusion and stopped aspirin therapy after discussion with cardiology Effient resumed due to recent DES stent.  no recurrent symptoms, no angina at this time Heme + 01/2013 ER eval but Hgb stable and colo on hold until >69mo from DES (05/2013) as per cards to electively hold effiet unless life threatening  Bleed recurs GI note 10/2012 in EMR and cards note 12/2012 reviewed

## 2013-04-05 ENCOUNTER — Encounter (INDEPENDENT_AMBULATORY_CARE_PROVIDER_SITE_OTHER): Payer: Medicare Other | Admitting: Ophthalmology

## 2013-04-05 DIAGNOSIS — H2510 Age-related nuclear cataract, unspecified eye: Secondary | ICD-10-CM

## 2013-04-05 DIAGNOSIS — E1139 Type 2 diabetes mellitus with other diabetic ophthalmic complication: Secondary | ICD-10-CM

## 2013-04-05 DIAGNOSIS — H43819 Vitreous degeneration, unspecified eye: Secondary | ICD-10-CM

## 2013-04-05 DIAGNOSIS — E11319 Type 2 diabetes mellitus with unspecified diabetic retinopathy without macular edema: Secondary | ICD-10-CM

## 2013-04-05 DIAGNOSIS — H35039 Hypertensive retinopathy, unspecified eye: Secondary | ICD-10-CM

## 2013-04-05 DIAGNOSIS — I10 Essential (primary) hypertension: Secondary | ICD-10-CM

## 2013-04-05 LAB — HM DIABETES EYE EXAM

## 2013-06-10 ENCOUNTER — Telehealth: Payer: Self-pay | Admitting: Internal Medicine

## 2013-06-10 NOTE — Telephone Encounter (Signed)
Patient Information:  Caller Name: Steele Sizer  Phone: [phone]  Patient: [name], [name]  Gender: Male  DOB: [birth year]  Age: 67 Years  PCP: Rene Paci (Adults only)  Office Follow Up:  Does the office need to follow up with this patient?: Yes  Instructions For The Office: Patient says only goes to [place] office - Appt not available.  Please review - contact patient on cell  [phone].   Symptoms  Reason For Call & Symptoms: Cough with congestion started Fri 10/10.  Was very weak by Sun 10/12 and had difficulty eating that day.  Coughing up mucus with blood streaks, does not think as much as a tablespoon.  Does have wheezing but not severe.  Reviewed Health History In EMR: Yes  Reviewed Medications In EMR: Yes  Reviewed Allergies In EMR: Yes  Reviewed Surgeries / Procedures: Yes  Date of Onset of Symptoms: 06/07/2013  Treatments Tried: fluids, keep warm  Treatments Tried Worked: No  Guideline(s) Used:  Cough  Disposition Per Guideline:   Go to Office Now  Reason For Disposition Reached:   Wheezing is present  Advice Given:  N/A  Patient Will Follow Care Advice:  YES

## 2013-06-10 NOTE — Telephone Encounter (Signed)
Called pt spoke with wife. Gave md response. Made appt with [name] for 2mor @ 10:00...lmb

## 2013-06-10 NOTE — Telephone Encounter (Signed)
Pt will need an appt.

## 2013-06-11 ENCOUNTER — Encounter: Payer: Self-pay | Admitting: Internal Medicine

## 2013-06-11 ENCOUNTER — Ambulatory Visit (INDEPENDENT_AMBULATORY_CARE_PROVIDER_SITE_OTHER): Payer: Medicare Other | Admitting: Internal Medicine

## 2013-06-11 VITALS — BP 122/70 | HR 65 | Temp 98.0°F | Wt 244.0 lb

## 2013-06-11 DIAGNOSIS — R042 Hemoptysis: Secondary | ICD-10-CM

## 2013-06-11 DIAGNOSIS — R05 Cough: Secondary | ICD-10-CM

## 2013-06-11 DIAGNOSIS — R059 Cough, unspecified: Secondary | ICD-10-CM

## 2013-06-11 NOTE — Patient Instructions (Signed)

## 2013-06-11 NOTE — Progress Notes (Signed)
HPI  Pt presents to the clinic today with c/o blood tinged sputum. This occurred one time 2 days ago. He has had a cough over the past couple of days. It is clear. He has not noticed any more blood. He has not taken anythign OTC because he is on coumadin. He denies fever, chills or body aches. He has no history of allergies or asthma. He denies sick contacts.  Review of Systems      Past Medical History  Diagnosis Date  . Type II or unspecified type diabetes mellitus without mention of complication, not stated as uncontrolled   . Unspecified essential hypertension   . Esophageal reflux   . Gout   . OSA (obstructive sleep apnea)     cpap started 12/2010  . Arthritis   . Umbilical hernia   . Ventral hernia   . Coronary artery disease     PTCA/DES to LAD 06/28/12    Family History  Problem Relation Age of Onset  . Coronary artery disease    . Diabetes    . Colon cancer    . Lung cancer Sister   . Cancer Sister     lung  . Cancer Brother     lung  . Cancer Sister     lung    History   Social History  . Marital Status: Married    Spouse Name: N/A    Number of Children: N/A  . Years of Education: N/A   Occupational History  . Not on file.   Social History Main Topics  . Smoking status: Former Smoker -- 2.00 packs/day for 40 years    Types: Cigarettes    Quit date: 08/29/1996  . Smokeless tobacco: Never Used  . Alcohol Use: No  . Drug Use: No  . Sexual Activity: Not on file   Other Topics Concern  . Not on file   Social History Narrative  . No narrative on file    No Known Allergies   Constitutional: Denies headache, fatigue, fever or abrupt weight changes.  HEENT:  Denies eye redness, eye pain, pressure behind the eyes, facial pain, nasal congestion, ear pain, ringing in the ears, wax buildup, runny nose or bloody nose. Respiratory: Positive cough. Denies difficulty breathing or shortness of breath.  Cardiovascular: Denies chest pain, chest tightness,  palpitations or swelling in the hands or feet.   No other specific complaints in a complete review of systems (except as listed in HPI above).  Objective:   BP 122/70  Pulse 65  Temp(Src) 98 F (36.7 C) (Oral)  Wt 244 lb (110.678 kg)  BMI 38.8 kg/m2  SpO2 97% Wt Readings from Last 3 Encounters:  06/11/13 244 lb (110.678 kg)  03/13/13 237 lb 6.4 oz (107.684 kg)  01/14/13 243 lb (110.224 kg)     General: Appears his stated age, obese but well developed, well nourished in NAD. HEENT: Head: normal shape and size; Eyes: sclera white, no icterus, conjunctiva pink, PERRLA and EOMs intact; Ears: Tm's gray and intact, normal light reflex; Nose: mucosa pink and moist, septum midline; Throat/Mouth: + PND. Teeth present, mucosa erythematous and moist, no exudate noted, no lesions or ulcerations noted.  Neck: Neck supple, trachea midline. No massses, lumps or thyromegaly present.  Cardiovascular: Normal rate and rhythm. S1,S2 noted.  No murmur, rubs or gallops noted. No JVD or BLE edema. No carotid bruits noted. Pulmonary/Chest: Normal effort and positive vesicular breath sounds. No respiratory distress. No wheezes, rales or ronchi noted.  Assessment & Plan:   Cough, likely viral:  Episode of blood tinged sputum likely secondary to throat irritation and being on coumadin Treat with OTC Delsym- if not effective call me and I will call you in some cough syrup Watch for recurrent blood tinged sputum, fever or colored sputum production  RTC as needed or if symptoms persist.

## 2013-07-15 ENCOUNTER — Encounter: Payer: Self-pay | Admitting: Cardiovascular Disease

## 2013-07-15 ENCOUNTER — Ambulatory Visit (INDEPENDENT_AMBULATORY_CARE_PROVIDER_SITE_OTHER): Payer: Medicare Other | Admitting: Cardiovascular Disease

## 2013-07-15 VITALS — BP 133/81 | HR 72 | Ht 67.0 in | Wt 240.7 lb

## 2013-07-15 DIAGNOSIS — I2510 Atherosclerotic heart disease of native coronary artery without angina pectoris: Secondary | ICD-10-CM

## 2013-07-15 DIAGNOSIS — E785 Hyperlipidemia, unspecified: Secondary | ICD-10-CM

## 2013-07-15 MED ORDER — ASPIRIN 81 MG PO TABS: 81.0000 mg | ORAL_TABLET | Freq: Every day | ORAL | Status: DC

## 2013-07-15 NOTE — Patient Instructions (Addendum)
Your physician wants you to follow-up in: 1 year  You will receive a reminder letter in the mail two months in advance. If you don't receive a letter, please call our office to schedule the follow-up appointment.  Your physician recommends that you return for a FASTING lipid profile: tomorrow   Your physician has recommended you make the following change in your medication: stop effient//start aspirin 81 mg daily    [facility] Low Glycemic Diet (Source: [facility], 2006) Low Glycemic Foods (20-49) (Decrease risk of developing heart disease) Breakfast Cereals: All-Bran All-Bran Fruit 'n Oats Fiber One Oatmeal (not instant) Oat bran Fruits and fruit juices: (Limit to 1-2 servings per day) Apples Apricots (fresh & dried) Blackberries Blueberries Cherries Cranberries Peaches Pears Plums Prunes Grapefruit Raspberries Strawberries Tangerine Apple juice Grapefruit juice Tomato juice Beans and legumes (fresh-cooked): Black-eyed peas Butter beans Chick peas Lentils  Green beans Lima beans Kidney beans Navy beans Pinto beans Snow peas Non-starchy vegetables: Asparagus, avocado, broccoli, cabbage, cauliflower, celery, cucumber, greens, lettuce, mushrooms, peppers, tomatoes, okra, onions, spinach, summer squash Grains: Barley Bulgur Rye Wild rice Nuts and oils : Almonds Peanuts Sunflower seeds Hazelnuts Pecans Walnuts Oils that are liquid at room temperature Dairy, fish, meat, soy, and eggs: Milk, skim Lowfat cheese Yogurt, lowfat, fruit sugar sweetened Lean red meat Fish  Skinless chicken & [place] Shellfish Egg whites (up to 3 daily) Soy products  Egg yolks (up to 7 or _____ per week) Moderate Glycemic Foods (50-69) Breakfast Cereals: Bran Buds Bran Chex Just Right Mini-Wheats  Special K Swiss muesli Fruits: Banana (under-ripe) Dates Figs Grapes Kiwi Mango Oranges Raisins Fruit Juices: Cranberry juice Orange juice Beans and legumes: [place]-type  baked beans Canned pinto, kidney, or navy beans Green peas Vegetables: Beets Carrots  Sweet potato Yam Corn on the cob Breads: Pita (pocket) bread Oat bran bread Pumpernickel bread Rye bread Wheat bread, high fiber  Grains: Cornmeal Rice, brown Rice, white Couscous Pasta: Macaroni Pizza, cheese Ravioli, meat filled Spaghetti, white  Nuts: Cashews Macadamia Snacks: Chocolate Ice cream, lowfat Muffin Popcorn High Glycemic Foods (70-100)  Breakfast Cereals: Cheerios Corn Chex Corn Flakes Cream of Wheat Grape Nuts Grape Nut Flakes Grits Nutri-Grain Puffed Rice Puffed Wheat Rice Chex Rice Krispies Shredded Wheat Team Total Fruits: Pineapple Watermelon Banana (over-ripe) Beverages: Sodas, sweet tea, pineapple juice Vegetables: Potato, baked, boiled, fried, mashed [place] fries Canned or frozen corn Parsnips Winter squash Breads: Most breads (white and whole grain) Bagels Bread sticks Bread stuffing Kaiser roll Dinner rolls Grains: Rice, instant Tapioca, with milk Candy and most cookies Snacks: Donuts Corn chips Jelly beans Pretzels Pastries

## 2013-07-15 NOTE — Assessment & Plan Note (Signed)
[name] has completed his 1 year therapy of Effient.  Will DC at this point.  Continue asa 81 mg. He needs to schedule his colonoscopy - I would prefer that he stay on his aspirin for the colonoscopy .    Will check fasting lipids later this week.  I'll see him in 1 year for ov and fasting labs.

## 2013-07-15 NOTE — Progress Notes (Signed)
[name] Date of Birth  04-07-1946       [facility]    [place] [address], [address]  [address], [address] [place], [place]  [address]   [place], [place]  [address] [phone]     [phone]   Fax  [phone]    Fax [phone]  Problem List: 1. CAD -  3.0 x 28 mm drug-eluting stent (06-28-12) 2. Indigestion 3. Anemia   History of Present Illness:  [name] is a 67 yo with hx of indigestion / chest pain  that develops with walking.  The pain is a "ache" and lasts as long as he is walking.   It causes him some dyspnea.  It typically goes away when he stops.  He has lots of belching.  Hx of asthma. Hx of smoking in the past.  No regular exercise.  He is now retired.  He gets a little exercise every day.  He had a dobutamine echo that revealed mild ischemia in the inferior wall.    He had a cardiac cath which revealed a tight irregular LAD stenosis. He had PTCA and stenting of his LAD by Dr. Excell Seltzer.  We have advised him to stop  eating fried foods. He still is eating fried potatoes on a regular basis.   Jan 14, 2013:  [name] has done well from a cardiac standpoint. He is having some problems with anemia. He was scheduled have a colonoscopy tomorrow but I did not want to stop the Effient  and aspirin. The colonoscopy was canceled.  Nov. 17, 2014:  [name] is doing well.  He is now a year out from his stenting and can stop the Effient for his colonoscopy.  He does some exercise.   He has completely ignored my dietary suggestions from previous visits.  Still eats everything that he wants to.  Having some abdominal pains.    Current Outpatient Prescriptions on File Prior to Visit  Medication Sig Dispense Refill  . atorvastatin (LIPITOR) 80 MG tablet Take 1 tablet (80 mg total) by mouth daily.  30 tablet  11  . ferrous sulfate 325 (65 FE) MG tablet Take 325 mg by mouth 2 (two) times daily.      . furosemide (LASIX) 20 MG tablet Take 2 tablets (40  mg total) by mouth daily.  60 tablet  5  . losartan (COZAAR) 50 MG tablet Take 50 mg by mouth daily.      . metFORMIN (GLUCOPHAGE) 500 MG tablet Take 500 mg by mouth 2 (two) times daily with a meal.      . nitroGLYCERIN (NITROSTAT) 0.4 MG SL tablet Place 1 tablet (0.4 mg total) under the tongue every 5 (five) minutes as needed for chest pain.  25 tablet  3  . prasugrel (EFFIENT) 10 MG TABS Take 1 tablet (10 mg total) by mouth daily.  30 tablet  11   No current facility-administered medications on file prior to visit.    No Known Allergies  Past Medical History  Diagnosis Date  . Type II or unspecified type diabetes mellitus without mention of complication, not stated as uncontrolled   . Unspecified essential hypertension   . Esophageal reflux   . Gout   . OSA (obstructive sleep apnea)     cpap started 12/2010  . Arthritis   . Umbilical hernia   . Ventral hernia   . Coronary artery disease     PTCA/DES to LAD 06/28/12  Past Surgical History  Procedure Laterality Date  . Appendectomy  04/2004  . Colon surgery    . Leg surgery    . Kidney stone surgery    . Ankle surgery    . Hernia repair  06/06/11    lap repair of incarcerated umbilical hernia with mesh  . Coronary angioplasty with stent placement  06/28/2012    DES   to LAD  . Esophagogastroduodenoscopy  08/15/2012    Procedure: ESOPHAGOGASTRODUODENOSCOPY (EGD);  Surgeon: Graylin Shiver, MD;  Location: [facility] ENDOSCOPY;  Service: Endoscopy;  Laterality: N/A;    History  Smoking status  . Former Smoker -- 2.00 packs/day for 40 years  . Types: Cigarettes  . Quit date: 08/29/1996  Smokeless tobacco  . Never Used    History  Alcohol Use No    Family History  Problem Relation Age of Onset  . Coronary artery disease    . Diabetes    . Colon cancer    . Lung cancer Sister   . Cancer Sister     lung  . Cancer Brother     lung  . Cancer Sister     lung    Reviw of Systems:  Reviewed in the HPI.  All other systems  are negative.  Physical Exam: Blood pressure 133/81, pulse 72, height 5\' 7"  (1.702 m), weight 240 lb 11.2 oz (109.181 kg). General: Well developed, well nourished, in no acute distress.  Head: Normocephalic, atraumatic, sclera non-icteric, mucus membranes are moist,   Neck: Supple. Carotids are 2 + without bruits. No JVD  Lungs: Clear bilaterally to auscultation.  Few rhonchi with cough  Heart: regular rate.  normal  S1 S2. No murmurs, gallops or rubs.  Abdomen: Soft, non-tender, obese.  Exogenous obesity - difficult to access femoral arteries.  Msk:  Strength and tone are normal  Extremities: No clubbing or cyanosis. No   edema.  Distal pedal pulses are 2+ and equal bilaterally.  Neuro: Alert and oriented X 3. Moves all extremities spontaneously.  Psych:  Responds to questions appropriately with a normal affect.  ECG:  Assessment / Plan:

## 2013-07-16 ENCOUNTER — Other Ambulatory Visit (INDEPENDENT_AMBULATORY_CARE_PROVIDER_SITE_OTHER): Payer: Medicare Other

## 2013-07-16 DIAGNOSIS — I2510 Atherosclerotic heart disease of native coronary artery without angina pectoris: Secondary | ICD-10-CM

## 2013-07-16 DIAGNOSIS — E785 Hyperlipidemia, unspecified: Secondary | ICD-10-CM

## 2013-07-16 LAB — BASIC METABOLIC PANEL
BUN: 14 mg/dL (ref 6–23)
Calcium: 9.1 mg/dL (ref 8.4–10.5)
GFR: 113.78 mL/min (ref >60.00)
Glucose, Bld: 115 mg/dL — ABNORMAL HIGH (ref 70–99)
Potassium: 4.4 mEq/L (ref 3.5–5.1)
Sodium: 138 mEq/L (ref 135–145)

## 2013-07-16 LAB — HEPATIC FUNCTION PANEL
ALT: 19 U/L (ref 0–53)
AST: 21 U/L (ref 0–37)
Albumin: 3.9 g/dL (ref 3.5–5.2)
Bilirubin, Direct: 0.1 mg/dL (ref 0.0–0.3)
Total Protein: 7.1 g/dL (ref 6.0–8.3)

## 2013-07-16 LAB — LIPID PANEL
Cholesterol: 94 mg/dL (ref 0–200)
Total CHOL/HDL Ratio: 3
VLDL: 15 mg/dL (ref 0.0–40.0)

## 2013-07-20 ENCOUNTER — Other Ambulatory Visit: Payer: Self-pay | Admitting: Internal Medicine

## 2013-07-23 ENCOUNTER — Other Ambulatory Visit: Payer: Self-pay | Admitting: *Deleted

## 2013-07-23 MED ORDER — ATORVASTATIN CALCIUM 80 MG PO TABS: 80.0000 mg | ORAL_TABLET | Freq: Every day | ORAL | Status: DC

## 2013-08-06 ENCOUNTER — Other Ambulatory Visit: Payer: Self-pay | Admitting: Gastroenterology

## 2013-08-08 ENCOUNTER — Other Ambulatory Visit: Payer: Self-pay | Admitting: Internal Medicine

## 2013-08-17 ENCOUNTER — Encounter: Payer: Self-pay | Admitting: Internal Medicine

## 2013-09-03 ENCOUNTER — Other Ambulatory Visit: Payer: Self-pay | Admitting: Urology

## 2013-09-05 ENCOUNTER — Encounter (HOSPITAL_BASED_OUTPATIENT_CLINIC_OR_DEPARTMENT_OTHER): Payer: Self-pay | Admitting: *Deleted

## 2013-09-05 NOTE — Progress Notes (Signed)
SPOKE W/ PT WIFE. NPO AFTER MN. ARRIVE AT 0900. NEEDS ISTAT. CURRENT EKG IN EPIC AND CHART. WILL TAKE COZAAR, LIPITOR AND ZANTAC AM DOS W/ SIPS OF WATER.

## 2013-09-09 ENCOUNTER — Encounter (HOSPITAL_BASED_OUTPATIENT_CLINIC_OR_DEPARTMENT_OTHER)
Admission: RE | Disposition: A | Discharge status: Home / Self Care | Payer: Self-pay | Source: Ambulatory Visit | Attending: Urology

## 2013-09-09 ENCOUNTER — Ambulatory Visit (HOSPITAL_BASED_OUTPATIENT_CLINIC_OR_DEPARTMENT_OTHER)
Admission: RE | Admit: 2013-09-09 | Discharge: 2013-09-09 | Disposition: A | Discharge status: Home / Self Care | Payer: Medicare Other | Source: Ambulatory Visit | Attending: Urology | Admitting: Urology

## 2013-09-09 ENCOUNTER — Encounter (HOSPITAL_BASED_OUTPATIENT_CLINIC_OR_DEPARTMENT_OTHER): Payer: Medicare Other | Admitting: Anesthesiology

## 2013-09-09 ENCOUNTER — Encounter (HOSPITAL_BASED_OUTPATIENT_CLINIC_OR_DEPARTMENT_OTHER): Payer: Self-pay | Admitting: *Deleted

## 2013-09-09 ENCOUNTER — Ambulatory Visit (HOSPITAL_BASED_OUTPATIENT_CLINIC_OR_DEPARTMENT_OTHER): Payer: Medicare Other | Admitting: Anesthesiology

## 2013-09-09 DIAGNOSIS — I2510 Atherosclerotic heart disease of native coronary artery without angina pectoris: Secondary | ICD-10-CM | POA: Insufficient documentation

## 2013-09-09 DIAGNOSIS — Z9861 Coronary angioplasty status: Secondary | ICD-10-CM | POA: Insufficient documentation

## 2013-09-09 DIAGNOSIS — E119 Type 2 diabetes mellitus without complications: Secondary | ICD-10-CM | POA: Insufficient documentation

## 2013-09-09 DIAGNOSIS — D649 Anemia, unspecified: Secondary | ICD-10-CM | POA: Insufficient documentation

## 2013-09-09 DIAGNOSIS — N471 Phimosis: Secondary | ICD-10-CM | POA: Insufficient documentation

## 2013-09-09 DIAGNOSIS — I10 Essential (primary) hypertension: Secondary | ICD-10-CM | POA: Insufficient documentation

## 2013-09-09 DIAGNOSIS — N478 Other disorders of prepuce: Secondary | ICD-10-CM | POA: Insufficient documentation

## 2013-09-09 DIAGNOSIS — Z87891 Personal history of nicotine dependence: Secondary | ICD-10-CM | POA: Insufficient documentation

## 2013-09-09 DIAGNOSIS — G4730 Sleep apnea, unspecified: Secondary | ICD-10-CM | POA: Insufficient documentation

## 2013-09-09 DIAGNOSIS — Z79899 Other long term (current) drug therapy: Secondary | ICD-10-CM | POA: Insufficient documentation

## 2013-09-09 DIAGNOSIS — N3943 Post-void dribbling: Secondary | ICD-10-CM | POA: Insufficient documentation

## 2013-09-09 HISTORY — DX: Type 2 diabetes mellitus without complications: E11.9

## 2013-09-09 HISTORY — DX: Personal history of other diseases of the digestive system: Z87.19

## 2013-09-09 HISTORY — DX: Gastro-esophageal reflux disease without esophagitis: K21.9

## 2013-09-09 HISTORY — DX: Personal history of other diseases of the musculoskeletal system and connective tissue: Z87.39

## 2013-09-09 HISTORY — DX: Other chronic pain: G89.29

## 2013-09-09 HISTORY — DX: Spondylosis, unspecified: M47.9

## 2013-09-09 HISTORY — DX: Hyperlipidemia, unspecified: E78.5

## 2013-09-09 HISTORY — DX: Low back pain: M54.5

## 2013-09-09 HISTORY — DX: Phimosis: N47.1

## 2013-09-09 HISTORY — DX: Personal history of traumatic brain injury: Z87.820

## 2013-09-09 HISTORY — PX: CIRCUMCISION: SHX1350

## 2013-09-09 HISTORY — DX: Iron deficiency anemia, unspecified: D50.9

## 2013-09-09 LAB — POCT I-STAT 4, (NA,K, GLUC, HGB,HCT)
Glucose, Bld: 122 mg/dL — ABNORMAL HIGH (ref 70–99)
HCT: 37 % — ABNORMAL LOW (ref 39.0–52.0)
HEMOGLOBIN: 12.6 g/dL — AB (ref 13.0–17.0)
Potassium: 4.4 mEq/L (ref 3.7–5.3)
SODIUM: 143 meq/L (ref 137–147)

## 2013-09-09 LAB — GLUCOSE, CAPILLARY: Glucose-Capillary: 101 mg/dL — ABNORMAL HIGH (ref 70–99)

## 2013-09-09 SURGERY — CIRCUMCISION, ADULT
Anesthesia: General | Site: Penis

## 2013-09-09 MED ORDER — MIDAZOLAM HCL 5 MG/5ML IJ SOLN
INTRAMUSCULAR | Status: DC | PRN
  Administered 2013-09-09: 2 mg via INTRAVENOUS

## 2013-09-09 MED ORDER — GLYCOPYRROLATE 0.2 MG/ML IJ SOLN
INTRAMUSCULAR | Status: DC | PRN
  Administered 2013-09-09: .1 mg via INTRAVENOUS

## 2013-09-09 MED ORDER — MIDAZOLAM HCL 2 MG/2ML IJ SOLN
INTRAMUSCULAR | Status: AC
  Filled 2013-09-09: qty 2

## 2013-09-09 MED ORDER — ONDANSETRON HCL 4 MG/2ML IJ SOLN
INTRAMUSCULAR | Status: DC | PRN
  Administered 2013-09-09: 4 mg via INTRAVENOUS

## 2013-09-09 MED ORDER — CEFAZOLIN SODIUM-DEXTROSE 2-3 GM-% IV SOLR
2.0000 g | INTRAVENOUS | Status: AC
  Administered 2013-09-09: 2 g via INTRAVENOUS
  Filled 2013-09-09: qty 50

## 2013-09-09 MED ORDER — PHENYLEPHRINE HCL 10 MG/ML IJ SOLN
INTRAMUSCULAR | Status: DC | PRN
  Administered 2013-09-09: 40 ug via INTRAVENOUS

## 2013-09-09 MED ORDER — LIDOCAINE HCL 1 % IJ SOLN
INTRAMUSCULAR | Status: DC | PRN
  Administered 2013-09-09: 50 mg via INTRADERMAL

## 2013-09-09 MED ORDER — LIDOCAINE HCL 1 % IJ SOLN
INTRAMUSCULAR | Status: DC | PRN
  Administered 2013-09-09: 8.5 mL

## 2013-09-09 MED ORDER — HYDROCODONE-ACETAMINOPHEN 5-325 MG PO TABS: 1.0000 | ORAL_TABLET | Freq: Four times a day (QID) | ORAL | Status: DC | PRN

## 2013-09-09 MED ORDER — BACITRACIN ZINC 500 UNIT/GM EX OINT
TOPICAL_OINTMENT | CUTANEOUS | Status: DC | PRN
  Administered 2013-09-09: 1 via TOPICAL

## 2013-09-09 MED ORDER — FENTANYL CITRATE 0.05 MG/ML IJ SOLN
INTRAMUSCULAR | Status: DC | PRN
  Administered 2013-09-09 (×2): 50 ug via INTRAVENOUS

## 2013-09-09 MED ORDER — FENTANYL CITRATE 0.05 MG/ML IJ SOLN
INTRAMUSCULAR | Status: AC
  Filled 2013-09-09: qty 2

## 2013-09-09 MED ORDER — LACTATED RINGERS IV SOLN
INTRAVENOUS | Status: DC
  Administered 2013-09-09: 10:00:00 via INTRAVENOUS
  Filled 2013-09-09: qty 1000

## 2013-09-09 MED ORDER — BUPIVACAINE HCL (PF) 0.25 % IJ SOLN
INTRAMUSCULAR | Status: DC | PRN
  Administered 2013-09-09: 8.5 mL

## 2013-09-09 MED ORDER — PROPOFOL INFUSION 10 MG/ML OPTIME
INTRAVENOUS | Status: DC | PRN
  Administered 2013-09-09: 100 ug/kg/min via INTRAVENOUS

## 2013-09-09 SURGICAL SUPPLY — 33 items
BANDAGE COBAN STERILE 2 (GAUZE/BANDAGES/DRESSINGS) ×2 IMPLANT
BLADE SURG 15 STRL LF DISP TIS (BLADE) ×1 IMPLANT
BLADE SURG 15 STRL SS (BLADE) ×1
CLOTH BEACON ORANGE TIMEOUT ST (SAFETY) ×2 IMPLANT
COVER MAYO STAND STRL (DRAPES) ×2 IMPLANT
COVER TABLE BACK 60X90 (DRAPES) ×2 IMPLANT
DRAPE PED LAPAROTOMY (DRAPES) ×2 IMPLANT
DRSG TEGADERM 2-3/8X2-3/4 SM (GAUZE/BANDAGES/DRESSINGS) IMPLANT
DRSG VASELINE 3X18 (GAUZE/BANDAGES/DRESSINGS) IMPLANT
ELECT NEEDLE TIP 2.8 STRL (NEEDLE) ×2 IMPLANT
ELECT REM PT RETURN 9FT ADLT (ELECTROSURGICAL) ×2
ELECTRODE REM PT RTRN 9FT ADLT (ELECTROSURGICAL) ×1 IMPLANT
GAUZE PETROLATUM 1 X8 (GAUZE/BANDAGES/DRESSINGS) ×2 IMPLANT
GAUZE SPONGE 4X4 12PLY STRL LF (GAUZE/BANDAGES/DRESSINGS) IMPLANT
GAUZE VASELINE 1X8 (GAUZE/BANDAGES/DRESSINGS) ×2 IMPLANT
GLOVE BIO SURGEON STRL SZ 6.5 (GLOVE) ×2 IMPLANT
GLOVE BIO SURGEON STRL SZ7.5 (GLOVE) ×4 IMPLANT
GLOVE INDICATOR 7.0 STRL GRN (GLOVE) ×2 IMPLANT
GOWN STRL REUS W/TWL LRG LVL3 (GOWN DISPOSABLE) ×2 IMPLANT
GOWN STRL REUS W/TWL XL LVL3 (GOWN DISPOSABLE) ×2 IMPLANT
GOWN W/2 COTTON TOWELS 2 STD (GOWNS) IMPLANT
NEEDLE HYPO 25X1 1.5 SAFETY (NEEDLE) ×2 IMPLANT
NS IRRIG 500ML POUR BTL (IV SOLUTION) ×2 IMPLANT
PACK BASIN DAY SURGERY FS (CUSTOM PROCEDURE TRAY) ×2 IMPLANT
PENCIL BUTTON HOLSTER BLD 10FT (ELECTRODE) ×2 IMPLANT
SPONGE GAUZE 4X4 12PLY STER LF (GAUZE/BANDAGES/DRESSINGS) ×2 IMPLANT
SUT VIC AB 5-0 P-3 18X BRD (SUTURE) ×1 IMPLANT
SUT VIC AB 5-0 P3 18 (SUTURE) ×1
SUT VICRYL 4-0 PS2 18IN ABS (SUTURE) ×6 IMPLANT
SYR CONTROL 10ML LL (SYRINGE) ×2 IMPLANT
TOWEL OR 17X24 6PK STRL BLUE (TOWEL DISPOSABLE) ×4 IMPLANT
TRAY DSU PREP LF (CUSTOM PROCEDURE TRAY) ×2 IMPLANT
WATER STERILE IRR 500ML POUR (IV SOLUTION) IMPLANT

## 2013-09-09 NOTE — Anesthesia Preprocedure Evaluation (Addendum)
Anesthesia Evaluation  Patient identified by MRN, date of birth, ID band Patient awake    Reviewed: Allergy & Precautions, H&P , NPO status , Patient's Chart, lab work & pertinent test results  Airway Mallampati: III TM Distance: >3 FB Neck ROM: Full    Dental  (+) Edentulous Upper and Edentulous Lower   Pulmonary sleep apnea (Noncompliant with CPAP) and Continuous Positive Airway Pressure Ventilation , former smoker,  breath sounds clear to auscultation        Cardiovascular hypertension, + CAD and + Cardiac Stents Rhythm:Regular Rate:Normal     Neuro/Psych negative neurological ROS  negative psych ROS   GI/Hepatic negative GI ROS, Neg liver ROS,   Endo/Other  negative endocrine ROSdiabetes, Type 2, Oral Hypoglycemic AgentsMorbid obesity  Renal/GU negative Renal ROS  negative genitourinary   Musculoskeletal negative musculoskeletal ROS (+)   Abdominal   Peds negative pediatric ROS (+)  Hematology  (+) anemia ,   Anesthesia Other Findings   Reproductive/Obstetrics negative OB ROS                         Anesthesia Physical Anesthesia Plan  ASA: III  Anesthesia Plan: MAC   Post-op Pain Management:    Induction: Intravenous  Airway Management Planned: Simple Face Mask  Additional Equipment:   Intra-op Plan:   Post-operative Plan:   Informed Consent: I have reviewed the patients History and Physical, chart, labs and discussed the procedure including the risks, benefits and alternatives for the proposed anesthesia with the patient or authorized representative who has indicated his/her understanding and acceptance.   Dental advisory given  Plan Discussed with: CRNA  Anesthesia Plan Comments:        Anesthesia Quick Evaluation

## 2013-09-09 NOTE — Anesthesia Postprocedure Evaluation (Signed)
Anesthesia Post Note  Patient: [name]  Procedure(s) Performed: Procedure(s) (LRB): CIRCUMCISION ADULT (N/A)  Anesthesia type: MAC  Patient location: PACU  Post pain: Pain level controlled  Post assessment: Post-op Vital signs reviewed  Last Vitals:  Filed Vitals:   09/09/13 1200  BP: 104/58  Pulse: 68  Temp:   Resp: 16    Post vital signs: Reviewed  Level of consciousness: sedated  Complications: No apparent anesthesia complications

## 2013-09-09 NOTE — H&P (Signed)
Reason For Visit [name] is referred today by Dr. Gwendolyn [name] for further assessment/treatment of phimosis.   History of Present Illness 68 year old male with a number of medical cord mobility disease including hypertension, coronary artery disease status post LAD stent, type 2 diabetes mellitus and nephrolithiasis.    He has recently developed inability to retract his foreskin. This is a relatively new problem. He has not had obvious episodes of balanitis in the past. With this, he has had more difficulty directing his urinary stream and has had some issues with postvoid dribbling.   Past Medical History Problems  1. History of Acute blood loss anemia (285.1) 2. History of Acute gastric ulcer (531.30) 3. History of CAD (coronary artery disease) (414.00) 4. History of alcohol abuse (305.03) 5. History of arthritis (V13.4) 6. History of diabetes mellitus (V12.29) 7. History of gout (V12.29) 8. History of heartburn (V12.79) 9. History of Umbilical hernia (660.6) 10. History of Ventral hernia (553.20)  Surgical History Problems  1. History of PTCA  Current Meds 1. Atorvastatin Calcium 80 MG Oral Tablet;  Therapy: [id number] to Recorded 2. Furosemide 20 MG Oral Tablet;  Therapy: 02Aug2014 to Recorded 3. Iron TABS;  Therapy: (Recorded:30Dec2014) to Recorded 4. Losartan Potassium 50 MG Oral Tablet;  Therapy: 23FTD3220 to Recorded 5. MetFORMIN HCl - 500 MG Oral Tablet;  Therapy: 30Aug2014 to Recorded 6. Ranitidine HCl - 150 MG Oral Tablet;  Therapy: (Recorded:30Dec2014) to Recorded  Allergies Medication  1. No Known Drug Allergies  Family History Problems  1. Family history of kidney stones (V18.69) : Son 2. Family history of prostate cancer (U54.27) : Brother  Social History Problems    Alcohol use   1-2 qd   Denied: History of Caffeine use   Current every day smoker (305.1)   Father deceased   Four children   Married   Mother deceased  Review  of Systems Genitourinary, constitutional, skin, eye, otolaryngeal, hematologic/lymphatic, cardiovascular, pulmonary, endocrine, musculoskeletal, gastrointestinal, neurological and psychiatric system(s) were reviewed and pertinent findings if present are noted.  Genitourinary: urinary frequency, nocturia and post-void dribbling.  Cardiovascular: chest pain and leg swelling.  Respiratory: shortness of breath.  Musculoskeletal: back pain and joint pain.    Vitals Vital Signs [Data Includes: Last 1 Day]  Recorded: [id number] 03:39PM  Blood Pressure: 125 / 69 Temperature: 97.8 F Heart Rate: 65 Recorded: 15VVO1607 03:36PM  Weight: 237 lb   Physical Exam Constitutional: Well nourished and well developed . No acute distress.  Neck: The appearance of the neck is normal and no neck mass is present.  Pulmonary: No respiratory distress and normal respiratory rhythm and effort.  Cardiovascular: Heart rate and rhythm are normal . No peripheral edema.  Abdomen: The abdomen is obese. The abdomen is soft and nontender. No masses are palpated. No CVA tenderness. No hernias are palpable. No hepatosplenomegaly noted.  Genitourinary: Examination of the penis demonstrates phimosis, but no balanitis. The penis is uncircumcised. The scrotum is normal in appearance. The right testis is palpably normal.  Skin: Normal skin turgor, no visible rash and no visible skin lesions.  Neuro/Psych:. Mood and affect are appropriate.    Results/Data Urine [Data Includes: Last 1 Day]   [id number]  COLOR STRAW   APPEARANCE CLEAR   SPECIFIC GRAVITY 1.015   pH 6.0   GLUCOSE NEG mg/dL  BILIRUBIN NEG   KETONE NEG mg/dL  BLOOD TRACE   PROTEIN NEG mg/dL  UROBILINOGEN 0.2 mg/dL  NITRITE NEG   LEUKOCYTE ESTERASE MOD   SQUAMOUS  EPITHELIAL/HPF FEW   WBC NONE SEEN WBC/hpf  RBC 0-2 RBC/hpf  BACTERIA NONE SEEN   CRYSTALS NONE SEEN   CASTS NONE SEEN    Assessment Assessed  1. Phimosis (605)  Discussion/Summary   [name] does have significant phimosis. I am unable to retract his foreskin. There is no evidence of balanitis. Unfortunately, this is not likely to resolve on its own with any topical therapy. Generally circumcision is necessary. Without that he is going to be at increased risk for penile irritation with balanitis and probably some ongoing voiding issues. I would propose outpatient procedure with IV sedation and local anesthetic. He did recently have a colonoscopy, which would be a similar anesthesia protocol. He did see his cardiologist in the last several months and apparently things were quite stable with plans for 73-month followup. We did talk about the procedure, risk, benefits, potential complications and recovery issues. We will set this up since he is interested in proceeding.

## 2013-09-09 NOTE — Op Note (Signed)
Preoperative diagnosis: Phimosis Postoperative diagnosis: Same  Procedure: Circumcision Surgeon: Bernestine Amass M.D. Anesthesia: MAC with penile block  Indications: The patient was recently evaluated for phimosis. All risks and benefits of circumcision discussed. Full informed consent obtained. The patient now presents for definitive procedure.  Technique and findings: The patient was brought to the operating room. Successful induction of general anesthesia. A penile block was then performed with 15 cc of quarter percent Marcaine. The patient was then prepped and draped in usual manner. Appropriate surgical timeout was performed. A circumferential incision was made behind the glans penis. The foreskin was then retracted and a second circumferential incision was made within the mucosal collar. The sleeve of redundant skin was removed. Skin edges were reapproximated with interrupted 4-0 Vicryl suture. The incision was wrapped with Vasoline gauze with bacitracin ointment. A Coban dressing was loosely wrapped. The patient was brought to recovery room in stable condition having had no obvious complications or problems. Sponge and needle counts were correct.

## 2013-09-09 NOTE — Discharge Instructions (Addendum)
Postoperative instructions for circumcision  Wound:  Remove the dressing the morning after surgery. In most cases your incision will have absorbable sutures that run along the course of your incision and will dissolve within the first 10-20 days. Some will fall out even earlier. Expect some redness as the sutures dissolved but this should occur only around the sutures. If there is generalized redness, especially with increasing pain or swelling, let us know. The penis will very likely get "black and blue" as the blood in the tissues spread. Sometimes the whole penis will turn colors. The black and blue is followed by a yellow and brown color. In time, all the discoloration will go away.  Diet:  You may return to your normal diet within 24 hours following your surgery. You may note some mild nausea and possibly vomiting the first 6-8 hours following surgery. This is usually due to the side effects of anesthesia, and will disappear quite soon. I would suggest clear liquids and a very light meal the first evening following your surgery.  Activity:  Your physical activity should be restricted the first 48 hours. During that time you should remain relatively inactive, moving about only when necessary. During the first 7-10 days following surgery he should avoid lifting any heavy objects (anything greater than 15 pounds), and avoid strenuous exercise. If you work, ask [place] specifically about your restrictions, both for work and home. We will write a note to your employer if needed.  Ice packs can be placed on and off over the penis for the first 48 hours to help relieve the pain and keep the swelling down. Frozen peas or corn in a ZipLock bag can be frozen, used and re-frozen. Fifteen minutes on and 15 minutes off is a reasonable schedule.  No sexual activity for 1 month.  Hygiene:  You may shower 48 hours after your surgery. Tub bathing should be restricted until the seventh day.  Medication:  You  will be sent home with some type of pain medication. In many cases you will be sent home with a narcotic pain pill (Vicodin or Tylox). If the pain is not too bad, you may take either Tylenol (acetaminophen) or Advil (ibuprofen) which contain no narcotic agents, and might be tolerated a little better, with fewer side effects. If the pain medication you are sent home with does not control the pain, you will have to let us know. Some narcotic pain medications cannot be given or refilled by a phone call to a pharmacy.  Problems you should report to [place]:   Fever of 101.0 degrees Fahrenheit or greater.  Moderate or severe swelling under the skin incision or involving the scrotum.  Drug reaction such as hives, a rash, nausea or vomiting.   May restart aspirin in 5 days    Post Anesthesia Home Care Instructions  Activity: Get plenty of rest for the remainder of the day. A responsible adult should stay with you for 24 hours following the procedure.  For the next 24 hours, DO NOT: -Drive a car -Paediatric nurse -Drink alcoholic beverages -Take any medication unless instructed by your physician -Make any legal decisions or sign important papers.  Meals: Start with liquid foods such as gelatin or soup. Progress to regular foods as tolerated. Avoid greasy, spicy, heavy foods. If nausea and/or vomiting occur, drink only clear liquids until the nausea and/or vomiting subsides. Call your physician if vomiting continues.  Special Instructions/Symptoms: Your throat may feel dry or sore from the anesthesia  or the breathing tube placed in your throat during surgery. If this causes discomfort, gargle with warm salt water. The discomfort should disappear within 24 hours.

## 2013-09-09 NOTE — Transfer of Care (Signed)
Immediate Anesthesia Transfer of Care Note  Patient: [name]  Procedure(s) Performed: Procedure(s): CIRCUMCISION ADULT (N/A)  Patient Location: PACU  Anesthesia Type:MAC  Level of Consciousness: awake, alert , oriented and patient cooperative  Airway & Oxygen Therapy: Patient Spontanous Breathing and Patient connected to face mask oxygen  Post-op Assessment: Report given to PACU RN, Post -op Vital signs reviewed and stable and Patient moving all extremities  Post vital signs: Reviewed and stable  Complications: No apparent anesthesia complications

## 2013-09-10 ENCOUNTER — Encounter (HOSPITAL_BASED_OUTPATIENT_CLINIC_OR_DEPARTMENT_OTHER): Payer: Self-pay | Admitting: Urology

## 2013-09-11 ENCOUNTER — Ambulatory Visit (INDEPENDENT_AMBULATORY_CARE_PROVIDER_SITE_OTHER): Payer: Medicare Other | Admitting: Internal Medicine

## 2013-09-11 ENCOUNTER — Other Ambulatory Visit (INDEPENDENT_AMBULATORY_CARE_PROVIDER_SITE_OTHER): Payer: Medicare Other

## 2013-09-11 ENCOUNTER — Encounter: Payer: Self-pay | Admitting: Internal Medicine

## 2013-09-11 VITALS — BP 124/70 | HR 69 | Temp 98.5°F | Wt 253.8 lb

## 2013-09-11 DIAGNOSIS — Z0000 Encounter for general adult medical examination without abnormal findings: Secondary | ICD-10-CM

## 2013-09-11 DIAGNOSIS — Z23 Encounter for immunization: Secondary | ICD-10-CM

## 2013-09-11 DIAGNOSIS — E119 Type 2 diabetes mellitus without complications: Secondary | ICD-10-CM

## 2013-09-11 DIAGNOSIS — I2510 Atherosclerotic heart disease of native coronary artery without angina pectoris: Secondary | ICD-10-CM

## 2013-09-11 DIAGNOSIS — I10 Essential (primary) hypertension: Secondary | ICD-10-CM

## 2013-09-11 LAB — HEMOGLOBIN A1C: Hgb A1c MFr Bld: 5.4 % (ref 4.6–6.5)

## 2013-09-11 NOTE — Assessment & Plan Note (Signed)
On metformin for same since 07/2010- also ARB and statin Check a1c q19mo, titrate as needed The patient is asked to make an attempt to improve diet and exercise patterns to aid in medical management of this problem.  Lab Results  Component Value Date   HGBA1C 5.8 03/13/2013

## 2013-09-11 NOTE — Assessment & Plan Note (Signed)
BP Readings from Last 3 Encounters:  09/11/13 124/70  09/09/13 110/60  09/09/13 110/60   On ARB since 07/2010 -  the current medical regimen is effective;  continue present plan and medications.

## 2013-09-11 NOTE — Assessment & Plan Note (Signed)
PTCA/DES to LAD 06/28/12 - on Effient and ASA 81 -  briefly held both meds due to GIB 07/2012 x 48h Then stopped ASA 81 due to recurrent GI loss anemia No angina continue statin and ARB Follows regularly with cards for same - last OV reviewed

## 2013-09-11 NOTE — Progress Notes (Signed)
Pre-visit discussion using our clinic review tool. No additional management support is needed unless otherwise documented below in the visit note.  

## 2013-09-11 NOTE — Patient Instructions (Addendum)
It was good to see you today.  We have reviewed your prior records including labs and tests today  Health Maintenance reviewed - all recommended immunizations and age-appropriate screenings are up-to-date. Your annual flu shot was given and/or updated today.  Test(s) ordered today. Your results will be released to [name] (or called to you) after review, usually within 72hours after test completion. If any changes need to be made, you will be notified at that same time.  Medications reviewed and updated, no changes recommended at this time.  Please schedule followup in 6 months for diabetes mellitus check and labs, call sooner if problems.  Health Maintenance, Males A healthy lifestyle and preventative care can promote health and wellness.  Maintain regular health, dental, and eye exams.  Eat a healthy diet. Foods like vegetables, fruits, whole grains, low-fat dairy products, and lean protein foods contain the nutrients you need and are low in calories. Decrease your intake of foods high in solid fats, added sugars, and salt. Get information about a proper diet from your health care provider, if necessary.  Regular physical exercise is one of the most important things you can do for your health. Most adults should get at least 150 minutes of moderate-intensity exercise (any activity that increases your heart rate and causes you to sweat) each week. In addition, most adults need muscle-strengthening exercises on 2 or more days a week.   Maintain a healthy weight. The body mass index (BMI) is a screening tool to identify possible weight problems. It provides an estimate of body fat based on height and weight. Your health care provider can find your BMI and can help you achieve or maintain a healthy weight. For males 20 years and older:  A BMI below 18.5 is considered underweight.  A BMI of 18.5 to 24.9 is normal.  A BMI of 25 to 29.9 is considered overweight.  A BMI of 30 and above is  considered obese.  Maintain normal blood lipids and cholesterol by exercising and minimizing your intake of saturated fat. Eat a balanced diet with plenty of fruits and vegetables. Blood tests for lipids and cholesterol should begin at age 81 and be repeated every 5 years. If your lipid or cholesterol levels are high, you are over 50, or you are at high risk for heart disease, you may need your cholesterol levels checked more frequently.Ongoing high lipid and cholesterol levels should be treated with medicines, if diet and exercise are not working.  If you smoke, find out from your health care provider how to quit. If you do not use tobacco, do not start.  Lung cancer screening is recommended for adults aged 23 80 years who are at high risk for developing lung cancer because of a history of smoking. A yearly low-dose CT scan of the lungs is recommended for people who have at least a 30-pack-year history of smoking and are a current smoker or have quit within the past 15 years. A pack year of smoking is smoking an average of 1 pack of cigarettes a day for 1 year (for example, a 30-pack-year history of smoking could mean smoking 1 pack a day for 30 years or 2 packs a day for 15 years). Yearly screening should continue until the smoker has stopped smoking for at least 15 years. Yearly screening should be stopped for people who develop a health problem that would prevent them from having lung cancer treatment.  If you choose to drink alcohol, do not have more than  2 drinks per day. One drink is considered to be 12 oz (360 mL) of beer, 5 oz (150 mL) of wine, or 1.5 oz (45 mL) of liquor.  Avoid use of street drugs. Do not share needles with anyone. Ask for help if you need support or instructions about stopping the use of drugs.  High blood pressure causes heart disease and increases the risk of stroke. Blood pressure should be checked at least every 1 2 years. Ongoing high blood pressure should be treated  with medicines if weight loss and exercise are not effective.  If you are 20 68 years old, ask your health care provider if you should take aspirin to prevent heart disease.  Diabetes screening involves taking a blood sample to check your fasting blood sugar level. This should be done once every 3 years after age 46, if you are at a normal weight and without risk factors for diabetes. Testing should be considered at a younger age or be carried out more frequently if you are overweight and have at least 1 risk factor for diabetes.  Colorectal cancer can be detected and often prevented. Most routine colorectal cancer screening begins at the age of 56 and continues through age 35. However, your health care provider may recommend screening at an earlier age if you have risk factors for colon cancer. On a yearly basis, your health care provider may provide home test kits to check for hidden blood in the stool. A small camera at the end of a tube may be used to directly examine the colon (sigmoidoscopy or colonoscopy) to detect the earliest forms of colorectal cancer. Talk to your health care provider about this at age 34, when routine screening begins. A direct exam of the colon should be repeated every 5 10 years through age 65, unless early forms of pre-cancerous polyps or small growths are found.  People who are at an increased risk for hepatitis B should be screened for this virus. You are considered at high risk for hepatitis B if:  You were born in a country where hepatitis B occurs often. Talk with your health care provider about which countries are considered high-risk.  Your parents were born in a high-risk country and you have not received a shot to protect against hepatitis B (hepatitis B vaccine).  You have HIV or AIDS.  You use needles to inject street drugs.  You live with, or have sex with, someone who has hepatitis B.  You are a man who has sex with other men (MSM).  You get  hemodialysis treatment.  You take certain medicines for conditions like cancer, organ transplantation, and autoimmune conditions.  Hepatitis C blood testing is recommended for all people born from [birth year] through 1965 and any individual with known risk factors for hepatitis C.  Healthy men should no longer receive prostate-specific antigen (PSA) blood tests as part of routine cancer screening. Talk to your health care provider about prostate cancer screening.  Testicular cancer screening is not recommended for adolescents or adult males who have no symptoms. Screening includes self-exam, a health care provider exam, and other screening tests. Consult with your health care provider about any symptoms you have or any concerns you have about testicular cancer.  Practice safe sex. Use condoms and avoid high-risk sexual practices to reduce the spread of sexually transmitted infections (STIs).  Use sunscreen. Apply sunscreen liberally and repeatedly throughout the day. You should seek shade when your shadow is shorter than you. Protect yourself  by wearing long sleeves, pants, a wide-brimmed hat, and sunglasses year round, whenever you are outdoors.  Tell your health care provider of new moles or changes in moles, especially if there is a change in shape or color. Also tell your provider if a mole is larger than the size of a pencil eraser.  A one-time screening for abdominal aortic aneurysm (AAA) and surgical repair of large AAAs by ultrasound is recommended for men aged 33 75 years who are current or former smokers.  Stay current with your vaccines (immunizations). Document Released: 02/11/2008 Document Revised: 06/05/2013 Document Reviewed: 01/10/2011 [facility] Patient Information 2014 [place], [place]. Diabetes and Standards of Medical Care  Diabetes is complicated. You may find that your diabetes team includes a dietitian, nurse, diabetes educator, eye doctor, and more. To help everyone know what is  going on and to help you get the care you deserve, the following schedule of care was developed to help keep you on track. Below are the tests, exams, vaccines, medicines, education, and plans you will need. HbA1c test This test shows how well you have controlled your glucose over the past 2 3 months. It is used to see if your diabetes management plan needs to be adjusted.   It is performed at least 2 times a year if you are meeting treatment goals.  It is performed 4 times a year if therapy has changed or if you are not meeting treatment goals. Blood pressure test  This test is performed at every routine medical visit. The goal is less than 140/90 mmHg for most people, but 130/80 mmHg in some cases. Ask your health care provider about your goal. Dental exam  Follow up with the dentist regularly. Eye exam  If you are diagnosed with type 1 diabetes as a child, get an exam upon reaching the age of 28 years or older and have had diabetes for 3 5 years. Yearly eye exams are recommended after that initial eye exam.  If you are diagnosed with type 1 diabetes as an adult, get an exam within 5 years of diagnosis and then yearly.  If you are diagnosed with type 2 diabetes, get an exam as soon as possible after the diagnosis and then yearly. Foot care exam  Visual foot exams are performed at every routine medical visit. The exams check for cuts, injuries, or other problems with the feet.  A comprehensive foot exam should be done yearly. This includes visual inspection as well as assessing foot pulses and testing for loss of sensation.  Check your feet nightly for cuts, injuries, or other problems with your feet. Tell your health care provider if anything is not healing. Kidney function test (urine microalbumin)  This test is performed once a year.  Type 1 diabetes: The first test is performed 5 years after diagnosis.  Type 2 diabetes: The first test is performed at the time of diagnosis.  A  serum creatinine and estimated glomerular filtration rate (eGFR) test is done once a year to assess the level of chronic kidney disease (CKD), if present. Lipid profile (cholesterol, HDL, LDL, triglycerides)  Performed every 5 years for most people.  The goal for LDL is less than 100 mg/dL. If you are at high risk, the goal is less than 70 mg/dL.  The goal for HDL is 40 mg/dL 50 mg/dL for men and 50 mg/dL 60 mg/dL for women. An HDL cholesterol of 60 mg/dL or higher gives some protection against heart disease.  The goal for  triglycerides is less than 150 mg/dL. Influenza vaccine, pneumococcal vaccine, and hepatitis B vaccine  The influenza vaccine is recommended yearly.  The pneumococcal vaccine is generally given once in a lifetime. However, there are some instances when another vaccination is recommended. Check with your health care provider.  The hepatitis B vaccine is also recommended for adults with diabetes. Diabetes self-management education  Education is recommended at diagnosis and ongoing as needed. Treatment plan  Your treatment plan is reviewed at every medical visit. Document Released: 06/12/2009 Document Revised: 04/17/2013 Document Reviewed: 01/15/2013 [facility] Patient Information 2014 [place].

## 2013-09-11 NOTE — Progress Notes (Signed)
Subjective:    Patient ID: [name], male    DOB: Nov 15, 1945, 68 y.o.   MRN: [id number]  HPI   Here for medicare wellness  Diet: heart healthy or DM if diabetic Physical activity: sedentary Depression/mood screen: negative Hearing: intact to whispered voice Visual acuity: grossly normal, performs annual eye exam  ADLs: capable Fall risk: none Home safety: good Cognitive evaluation: intact to orientation, naming, recall and repetition EOL planning: adv directives, full code/ I agree  I have personally reviewed and have noted 1. The patient's medical and social history 2. Their use of alcohol, tobacco or illicit drugs 3. Their current medications and supplements 4. The patient's functional ability including ADL's, fall risks, home safety risks and hearing or visual impairment. 5. Diet and physical activities 6. Evidence for depression or mood disorders  Also reviewed chronic medical issues and interval medical events  Hx GIB with iron def and ABL anemia -  Denies BRBPR, nausea, epigastric pain, bowel changes, melena 08/15/12: EGD: several small antral ulcers and erosions with no apparent active bleed Stopped aspirin as instructed person and December 17 but continues on Effient for hx cardiac stent 06/28/2012  CAD - s/p PTCA/DES to LAD 06/28/12 - after procedure new pain located in epigastric and SS chest related to gastric/antal ulcer (see hosp 07/2012 for same), now resolved - taking all meds as rx'd after cath/DES  OSA - working with pulm and started CPAP for same 12/2010 - variable compliance -  DM2 - dx 2005  - on metformin since 07/2010- checks sugars 1-2x/day -the patient reports compliance with medication(s) as prescribed. Denies adverse side effects.  hypertension - on ARB since 07/2010 - denies chest pain or headache but chronic edema BLE without change-   EtOH abuse - reports cutting back on use > currently reports 1-2 beer/day - denies WD symptoms or prior DTs  - no known liver complications or hx legal/employer issues with alcohol   gout - no swelling or recent trauma/injury; +hx prior flares - improved with self-tx cherry juice     Past Medical History  Diagnosis Date  . Unspecified essential hypertension   . Coronary artery disease CARDIOLOGIST-  DR Cathie Olden    PTCA/DES to LAD 06/28/12  . S/P drug eluting coronary stent placement     06-28-2012  X1  DES  PROXIMAL LAD  . Type 2 diabetes mellitus   . GERD (gastroesophageal reflux disease)   . Phimosis   . History of kidney stones   . Hyperlipidemia   . History of concussion     1976--  NO RESIDUAL  . Iron deficiency anemia   . History of GI bleed     UPPER GASTRIC ULCER 2 YRS AGO-- NO ISSUES  . History of gout     2007 &  2008  LEFT LEG-- NO ISSUE SINCE  . Chronic low back pain   . Arthritis   . OA (osteoarthritis of spine)     LOWER BACK--  INTERMITTANT LEFT LEG NUMBNESS  . OSA (obstructive sleep apnea)     PULMOLOGIST-  DR CLANCE--  MODERATE OSA  STARTED CPAP 2012--  BUT CURRENTLY HAS NOT USED PAST 6 MONTHS   Family History  Problem Relation Age of Onset  . Coronary artery disease    . Diabetes    . Colon cancer    . Lung cancer Sister   . Cancer Sister     lung  . Cancer Brother     lung  .  Cancer Sister     lung   History  Substance Use Topics  . Smoking status: Former Smoker -- 2.00 packs/day for 40 years    Types: Cigarettes    Quit date: 08/29/1996  . Smokeless tobacco: Never Used  . Alcohol Use: 1.2 oz/week    2 Cans of beer per week    Review of Systems  Constitutional: Negative for fever and unexpected weight change.  Respiratory: Negative for cough, shortness of breath and wheezing.   Cardiovascular: Negative for palpitations and leg swelling.  Gastrointestinal: Negative for abdominal pain, abdominal distention and anal bleeding.  Musculoskeletal: Positive for back pain (chronic).  All other systems reviewed and are negative.       Objective:    Physical Exam BP 124/70  Pulse 69  Temp(Src) 98.5 F (36.9 C) (Oral)  Wt 253 lb 12.8 oz (115.123 kg)  SpO2 95% Wt Readings from Last 3 Encounters:  09/11/13 253 lb 12.8 oz (115.123 kg)  09/09/13 252 lb (114.306 kg)  09/09/13 252 lb (114.306 kg)   Constitutional:  Obese; appears well-developed and well-nourished. No distress. son at side - generalized poor hygiene Neck: Normal range of motion. Thick. Neck supple. No JVD present. No thyromegaly present.  Cardiovascular: Normal rate, regular rhythm and normal heart sounds.  No murmur heard. 1+ pitting BLE edema to below knee Pulmonary/Chest: Effort normal and breath sounds normal. No respiratory distress. no wheezes.  Abdominal: SNTND, bowel sounds are normal. There is no tenderness, r/g. resolved umbilical hernia, no recurrence MSkel: Back: full range of motion of thoracic and lumbar spine. Non tender to palpation. Negative straight leg raise. DTR's are symmetrically intact. Sensation intact in all dermatomes of the lower extremities. Full strength to manual muscle testing. patient is able to heel toe walk without difficulty and ambulates with antalgic gait. GU: deferred Psychiatric: he has a normal mood and affect. behavior is normal. Judgment and thought content normal.   Lab Results  Component Value Date   WBC 4.1 02/21/2013   HGB 12.6* 09/09/2013   HCT 37.0* 09/09/2013   PLT 118* 02/21/2013   CHOL 94 07/16/2013   TRIG 75.0 07/16/2013   HDL 36.10* 07/16/2013   ALT 19 07/16/2013   AST 21 07/16/2013   NA 143 09/09/2013   K 4.4 09/09/2013   CL 104 07/16/2013   CREATININE 0.7 07/16/2013   BUN 14 07/16/2013   CO2 27 07/16/2013   TSH 1.28 05/28/2012   INR 1.30 08/14/2012   HGBA1C 5.8 03/13/2013   MICROALBUR 0.8 03/13/2013        Assessment & Plan:   AWV/v70.0 - Today patient counseled on age appropriate routine health concerns for screening and prevention, each reviewed and up to date or declined. Immunizations reviewed and up to  date or declined. Labs reviewed. Risk factors for depression reviewed and negative. Hearing function and visual acuity are intact. ADLs screened and addressed as needed. Functional ability and level of safety reviewed and appropriate. Education, counseling and referrals performed based on assessed risks today. Patient provided with a copy of personalized plan for preventive services.  Also see problem list. Medications and labs reviewed today.

## 2013-11-25 ENCOUNTER — Other Ambulatory Visit: Payer: Self-pay | Admitting: Internal Medicine

## 2013-12-09 ENCOUNTER — Other Ambulatory Visit: Payer: Self-pay | Admitting: Internal Medicine

## 2013-12-17 ENCOUNTER — Emergency Department (INDEPENDENT_AMBULATORY_CARE_PROVIDER_SITE_OTHER)
Admission: EM | Admit: 2013-12-17 | Discharge: 2013-12-17 | Disposition: A | Discharge status: Home / Self Care | Payer: Medicare Other | Source: Home / Self Care | Attending: Family Medicine | Admitting: Family Medicine

## 2013-12-17 ENCOUNTER — Emergency Department (INDEPENDENT_AMBULATORY_CARE_PROVIDER_SITE_OTHER): Payer: Medicare Other

## 2013-12-17 ENCOUNTER — Encounter (HOSPITAL_COMMUNITY): Payer: Self-pay | Admitting: Emergency Medicine

## 2013-12-17 DIAGNOSIS — M545 Low back pain, unspecified: Secondary | ICD-10-CM

## 2013-12-17 MED ORDER — KETOROLAC TROMETHAMINE 30 MG/ML IJ SOLN
INTRAMUSCULAR | Status: AC
  Filled 2013-12-17: qty 1

## 2013-12-17 MED ORDER — KETOROLAC TROMETHAMINE 30 MG/ML IJ SOLN
30.0000 mg | Freq: Once | INTRAMUSCULAR | Status: AC
Start: 2013-12-17 — End: 2013-12-17
  Administered 2013-12-17: 30 mg via INTRAMUSCULAR

## 2013-12-17 MED ORDER — KETOROLAC TROMETHAMINE 10 MG PO TABS: 10.0000 mg | ORAL_TABLET | Freq: Four times a day (QID) | ORAL | Status: DC | PRN

## 2013-12-17 MED ORDER — CYCLOBENZAPRINE HCL 5 MG PO TABS: 5.0000 mg | ORAL_TABLET | Freq: Three times a day (TID) | ORAL | Status: DC | PRN

## 2013-12-17 NOTE — ED Provider Notes (Addendum)
CSN: [id number]     Arrival date & time 12/17/13  1139 History   None    Chief Complaint  Patient presents with  . Back Pain  . Hip Pain   (Consider location/radiation/quality/duration/timing/severity/associated sxs/prior Treatment) Patient is a 68 y.o. male presenting with back pain. The history is provided by the patient.  Back Pain Location:  Lumbar spine Quality:  Stiffness Radiates to:  Does not radiate Pain severity:  Moderate Onset quality:  Gradual Duration:  2 weeks Progression:  Unchanged Chronicity:  New Context comment:  NKI Worsened by:  Bending and palpation Associated symptoms: no abdominal pain, no chest pain, no fever and no numbness   Risk factors: obesity     Past Medical History  Diagnosis Date  . Unspecified essential hypertension   . Coronary artery disease CARDIOLOGIST-  DR Cathie Olden    PTCA/DES to LAD 06/28/12  . S/P drug eluting coronary stent placement     06-28-2012  X1  DES  PROXIMAL LAD  . Type 2 diabetes mellitus   . GERD (gastroesophageal reflux disease)   . Phimosis   . History of kidney stones   . Hyperlipidemia   . History of concussion     1976--  NO RESIDUAL  . Iron deficiency anemia   . History of GI bleed     UPPER GASTRIC ULCER 2 YRS AGO-- NO ISSUES  . History of gout     2007 &  2008  LEFT LEG-- NO ISSUE SINCE  . Chronic low back pain   . Arthritis   . OA (osteoarthritis of spine)     LOWER BACK--  INTERMITTANT LEFT LEG NUMBNESS  . OSA (obstructive sleep apnea)     PULMOLOGIST-  DR CLANCE--  MODERATE OSA  STARTED CPAP 2012--  BUT CURRENTLY HAS NOT USED PAST 6 MONTHS   Past Surgical History  Procedure Laterality Date  . Esophagogastroduodenoscopy  08/15/2012    Procedure: ESOPHAGOGASTRODUODENOSCOPY (EGD);  Surgeon: Wonda Horner, MD;  Location: [facility] ENDOSCOPY;  Service: Endoscopy;  Laterality: N/A;  . Orif right ankle fx  1989  . Laparoscopic umbilical hernia repair w/ mesh  06-06-2011  . Open appendectomy w/ partial cecectomy   05-16-2004  . Dobutamine stress echo  06-08-2012    MODERATE HYPOKINESIS/ ISCHEMIA MID INFERIOR WALL  . Coronary angioplasty with stent placement  06/28/2012  DR COOPER    PCI W/  X1 DES to [place]. LAD/  LM  40% OSTIAL & 50-60% DISTAL /  50% PROX LCX/  30-40% PROX RCA & 50% MID RCA/   LVEF 65-70%  . Nephrolithotomy  1990'S  . Circumcision N/A 09/09/2013    Procedure: CIRCUMCISION ADULT;  Surgeon: Bernestine Amass, MD;  Location[facility]  Service: Urology;  Laterality: N/A;   Family History  Problem Relation Age of Onset  . Coronary artery disease    . Diabetes    . Colon cancer    . Lung cancer Sister   . Cancer Sister     lung  . Cancer Brother     lung  . Cancer Sister     lung   History  Substance Use Topics  . Smoking status: Former Smoker -- 2.00 packs/day for 40 years    Types: Cigarettes    Quit date: 08/29/1996  . Smokeless tobacco: Never Used  . Alcohol Use: 1.2 oz/week    2 Cans of beer per week    Review of Systems  Constitutional: Negative.  Negative for  fever.  Cardiovascular: Negative for chest pain.  Gastrointestinal: Negative.  Negative for abdominal pain.  Genitourinary: Negative.   Musculoskeletal: Positive for back pain. Negative for gait problem and neck pain.  Neurological: Negative for numbness.    Allergies  Review of patient's allergies indicates no known allergies.  Home Medications   Prior to Admission medications   Medication Sig Start Date End Date Taking? Authorizing Provider  atorvastatin (LIPITOR) 80 MG tablet Take 80 mg by mouth every morning. 07/23/13  Yes Rowe Clack, MD  atorvastatin (LIPITOR) 80 MG tablet TAKE ONE TABLET BY MOUTH ONCE DAILY 12/09/13  Yes Rowe Clack, MD  ferrous sulfate 325 (65 FE) MG tablet Take 325 mg by mouth daily.    Yes Historical Provider, MD  losartan (COZAAR) 50 MG tablet TAKE ONE TABLET BY MOUTH ONCE DAILY--  TAKES IN AM 07/20/13  Yes Rowe Clack, MD  metFORMIN  (GLUCOPHAGE) 500 MG tablet TAKE ONE TABLET BY MOUTH TWICE DAILY WITH  A  MEAL 11/25/13  Yes Rowe Clack, MD  ranitidine (ZANTAC) 150 MG tablet Take 150 mg by mouth every morning.   Yes Historical Provider, MD  furosemide (LASIX) 20 MG tablet Take 20 mg by mouth every morning. 01/14/13   Thayer Headings, MD  HYDROcodone-acetaminophen (NORCO/VICODIN) 5-325 MG per tablet Take 1-2 tablets by mouth every 6 (six) hours as needed. 09/09/13   Bernestine Amass, MD  nitroGLYCERIN (NITROSTAT) 0.4 MG SL tablet Place 1 tablet (0.4 mg total) under the tongue every 5 (five) minutes as needed for chest pain. 06/29/12   Rhonda G Barrett, PA-C   BP 153/61  Pulse 67  Temp(Src) 97.6 F (36.4 C) (Oral)  Resp 12  SpO2 100% Physical Exam  Nursing note and vitals reviewed. Constitutional: He is oriented to person, place, and time. He appears well-developed and well-nourished.  Abdominal: Soft. Bowel sounds are normal.  Musculoskeletal: He exhibits tenderness.       Lumbar back: He exhibits tenderness, bony tenderness, pain and spasm. He exhibits no edema, no deformity and normal pulse.  Neurological: He is alert and oriented to person, place, and time.  Neg slr, nl ehl.  Skin: Skin is warm and dry.    ED Course  Procedures (including critical care time) Labs Review Labs Reviewed - No data to display  Results for orders placed in visit on 09/11/13  HEMOGLOBIN A1C      Result Value Ref Range   Hemoglobin A1C 5.4  4.6 - 6.5 %   Imaging Review Dg Lumbar Spine Complete  12/17/2013   CLINICAL DATA:  Back pain and right hip pain  EXAM: LUMBAR SPINE - COMPLETE 4+ VIEW  COMPARISON:  CT abdomen pelvis 11/22/2012  FINDINGS: Normal lumbar alignment. Negative for fracture. Negative for mass lesion. Negative for pars defect. Mild lumbar disc degeneration.  10 mm right renal calculus similar to the prior study.  IMPRESSION: No acute lumbar spine abnormality.  Mild degenerative change  Right renal calculus.    Electronically Signed   By: Franchot Gallo M.D.   On: 12/17/2013 14:49   X-rays reviewed and report per radiologist.   MDM   1. Lumbar back pain        [name], MD 12/17/13 [place], MD 12/17/13 [phone]

## 2013-12-17 NOTE — ED Notes (Signed)
Pt c/o lower back right hip pain x 2 wks.  Denies any known injury.  No relief with asa or heating pad.

## 2014-02-13 ENCOUNTER — Other Ambulatory Visit: Payer: Self-pay | Admitting: Internal Medicine

## 2014-02-16 ENCOUNTER — Inpatient Hospital Stay (HOSPITAL_COMMUNITY)
Admission: EM | Admit: 2014-02-16 | Discharge: 2014-02-18 | DRG: 378 | Disposition: A | Discharge status: Home / Self Care | Payer: Medicare Other | Attending: Internal Medicine | Admitting: Internal Medicine

## 2014-02-16 ENCOUNTER — Encounter (HOSPITAL_COMMUNITY): Payer: Self-pay | Admitting: Emergency Medicine

## 2014-02-16 DIAGNOSIS — K920 Hematemesis: Secondary | ICD-10-CM | POA: Diagnosis present

## 2014-02-16 DIAGNOSIS — Z801 Family history of malignant neoplasm of trachea, bronchus and lung: Secondary | ICD-10-CM

## 2014-02-16 DIAGNOSIS — K319 Disease of stomach and duodenum, unspecified: Secondary | ICD-10-CM | POA: Diagnosis present

## 2014-02-16 DIAGNOSIS — Z87442 Personal history of urinary calculi: Secondary | ICD-10-CM

## 2014-02-16 DIAGNOSIS — G4733 Obstructive sleep apnea (adult) (pediatric): Secondary | ICD-10-CM

## 2014-02-16 DIAGNOSIS — M109 Gout, unspecified: Secondary | ICD-10-CM

## 2014-02-16 DIAGNOSIS — E785 Hyperlipidemia, unspecified: Secondary | ICD-10-CM | POA: Diagnosis present

## 2014-02-16 DIAGNOSIS — K922 Gastrointestinal hemorrhage, unspecified: Secondary | ICD-10-CM

## 2014-02-16 DIAGNOSIS — Z9861 Coronary angioplasty status: Secondary | ICD-10-CM

## 2014-02-16 DIAGNOSIS — D696 Thrombocytopenia, unspecified: Secondary | ICD-10-CM | POA: Diagnosis present

## 2014-02-16 DIAGNOSIS — D6959 Other secondary thrombocytopenia: Secondary | ICD-10-CM | POA: Diagnosis present

## 2014-02-16 DIAGNOSIS — Z7982 Long term (current) use of aspirin: Secondary | ICD-10-CM

## 2014-02-16 DIAGNOSIS — K2270 Barrett's esophagus without dysplasia: Secondary | ICD-10-CM | POA: Diagnosis present

## 2014-02-16 DIAGNOSIS — Z87891 Personal history of nicotine dependence: Secondary | ICD-10-CM

## 2014-02-16 DIAGNOSIS — Z87898 Personal history of other specified conditions: Secondary | ICD-10-CM

## 2014-02-16 DIAGNOSIS — I10 Essential (primary) hypertension: Secondary | ICD-10-CM

## 2014-02-16 DIAGNOSIS — D500 Iron deficiency anemia secondary to blood loss (chronic): Secondary | ICD-10-CM

## 2014-02-16 DIAGNOSIS — I2510 Atherosclerotic heart disease of native coronary artery without angina pectoris: Secondary | ICD-10-CM

## 2014-02-16 DIAGNOSIS — R609 Edema, unspecified: Secondary | ICD-10-CM | POA: Diagnosis present

## 2014-02-16 DIAGNOSIS — K219 Gastro-esophageal reflux disease without esophagitis: Secondary | ICD-10-CM | POA: Diagnosis present

## 2014-02-16 DIAGNOSIS — G8929 Other chronic pain: Secondary | ICD-10-CM | POA: Diagnosis present

## 2014-02-16 DIAGNOSIS — D62 Acute posthemorrhagic anemia: Secondary | ICD-10-CM | POA: Diagnosis present

## 2014-02-16 DIAGNOSIS — M479 Spondylosis, unspecified: Secondary | ICD-10-CM | POA: Diagnosis present

## 2014-02-16 DIAGNOSIS — I119 Hypertensive heart disease without heart failure: Secondary | ICD-10-CM | POA: Diagnosis present

## 2014-02-16 DIAGNOSIS — K921 Melena: Principal | ICD-10-CM | POA: Diagnosis present

## 2014-02-16 DIAGNOSIS — E119 Type 2 diabetes mellitus without complications: Secondary | ICD-10-CM

## 2014-02-16 DIAGNOSIS — K7460 Unspecified cirrhosis of liver: Secondary | ICD-10-CM | POA: Diagnosis present

## 2014-02-16 DIAGNOSIS — E118 Type 2 diabetes mellitus with unspecified complications: Secondary | ICD-10-CM | POA: Diagnosis present

## 2014-02-16 DIAGNOSIS — R161 Splenomegaly, not elsewhere classified: Secondary | ICD-10-CM | POA: Diagnosis present

## 2014-02-16 NOTE — ED Notes (Signed)
The pt is c/o epigastric pain for 2-3 days ,  No sob dizziness no nausea.  He still has his gb.   The pain is better if he burps

## 2014-02-17 ENCOUNTER — Encounter (HOSPITAL_COMMUNITY): Payer: Self-pay | Admitting: Gastroenterology

## 2014-02-17 ENCOUNTER — Inpatient Hospital Stay (HOSPITAL_COMMUNITY): Payer: Medicare Other

## 2014-02-17 ENCOUNTER — Encounter (HOSPITAL_COMMUNITY)
Admission: EM | Disposition: A | Discharge status: Home / Self Care | Payer: Self-pay | Source: Home / Self Care | Attending: Internal Medicine

## 2014-02-17 ENCOUNTER — Inpatient Hospital Stay (HOSPITAL_COMMUNITY): Payer: Medicare Other | Admitting: Certified Registered"

## 2014-02-17 ENCOUNTER — Encounter (HOSPITAL_COMMUNITY): Payer: Medicare Other | Admitting: Certified Registered"

## 2014-02-17 DIAGNOSIS — D500 Iron deficiency anemia secondary to blood loss (chronic): Secondary | ICD-10-CM

## 2014-02-17 DIAGNOSIS — E119 Type 2 diabetes mellitus without complications: Secondary | ICD-10-CM

## 2014-02-17 DIAGNOSIS — Z87898 Personal history of other specified conditions: Secondary | ICD-10-CM

## 2014-02-17 DIAGNOSIS — I2510 Atherosclerotic heart disease of native coronary artery without angina pectoris: Secondary | ICD-10-CM

## 2014-02-17 DIAGNOSIS — K922 Gastrointestinal hemorrhage, unspecified: Secondary | ICD-10-CM

## 2014-02-17 DIAGNOSIS — R109 Unspecified abdominal pain: Secondary | ICD-10-CM

## 2014-02-17 HISTORY — PX: ESOPHAGOGASTRODUODENOSCOPY: SHX5428

## 2014-02-17 LAB — CBC
HEMATOCRIT: 26.6 % — AB (ref 39.0–52.0)
HEMATOCRIT: 25.4 % — AB (ref 39.0–52.0)
Hemoglobin: 8 g/dL — ABNORMAL LOW (ref 13.0–17.0)
Hemoglobin: 7.7 g/dL — ABNORMAL LOW (ref 13.0–17.0)
MCH: 27.1 pg (ref 26.0–34.0)
MCH: 27.3 pg (ref 26.0–34.0)
MCHC: 30.1 g/dL (ref 30.0–36.0)
MCHC: 30.3 g/dL (ref 30.0–36.0)
MCV: 90.2 fL (ref 78.0–100.0)
MCV: 90.1 fL (ref 78.0–100.0)
Platelets: 131 10*3/uL — ABNORMAL LOW (ref 150–400)
Platelets: 119 10*3/uL — ABNORMAL LOW (ref 150–400)
RBC: 2.95 MIL/uL — AB (ref 4.22–5.81)
RBC: 2.82 MIL/uL — ABNORMAL LOW (ref 4.22–5.81)
RDW: 13.7 % (ref 11.5–15.5)
RDW: 13.8 % (ref 11.5–15.5)
WBC: 4.2 10*3/uL (ref 4.0–10.5)
WBC: 3.3 10*3/uL — ABNORMAL LOW (ref 4.0–10.5)

## 2014-02-17 LAB — TROPONIN I
Troponin I: <0.3 ng/mL (ref <0.30)
Troponin I: <0.3 ng/mL (ref <0.30)

## 2014-02-17 LAB — POC OCCULT BLOOD, ED: FECAL OCCULT BLD: POSITIVE — AB

## 2014-02-17 LAB — HEPATIC FUNCTION PANEL
ALBUMIN: 3.3 g/dL — AB (ref 3.5–5.2)
ALT: 13 U/L (ref 0–53)
AST: 20 U/L (ref 0–37)
Alkaline Phosphatase: 93 U/L (ref 39–117)
Bilirubin, Direct: <0.2 mg/dL (ref 0.0–0.3)
TOTAL PROTEIN: 6.6 g/dL (ref 6.0–8.3)
Total Bilirubin: 0.2 mg/dL — ABNORMAL LOW (ref 0.3–1.2)

## 2014-02-17 LAB — PROTIME-INR
INR: 1.11 (ref 0.00–1.49)
PROTHROMBIN TIME: 14.1 s (ref 11.6–15.2)

## 2014-02-17 LAB — BASIC METABOLIC PANEL
BUN: 16 mg/dL (ref 6–23)
CO2: 26 meq/L (ref 19–32)
Calcium: 8.9 mg/dL (ref 8.4–10.5)
Chloride: 103 mEq/L (ref 96–112)
Creatinine, Ser: 0.66 mg/dL (ref 0.50–1.35)
GFR calc non Af Amer: >90 mL/min (ref >90)
Glucose, Bld: 167 mg/dL — ABNORMAL HIGH (ref 70–99)
POTASSIUM: 3.9 meq/L (ref 3.7–5.3)
Sodium: 140 mEq/L (ref 137–147)

## 2014-02-17 LAB — GLUCOSE, CAPILLARY
GLUCOSE-CAPILLARY: 140 mg/dL — AB (ref 70–99)
Glucose-Capillary: 110 mg/dL — ABNORMAL HIGH (ref 70–99)
Glucose-Capillary: 121 mg/dL — ABNORMAL HIGH (ref 70–99)
Glucose-Capillary: 175 mg/dL — ABNORMAL HIGH (ref 70–99)

## 2014-02-17 LAB — LIPASE, BLOOD: Lipase: 37 U/L (ref 11–59)

## 2014-02-17 LAB — PREPARE RBC (CROSSMATCH)

## 2014-02-17 SURGERY — EGD (ESOPHAGOGASTRODUODENOSCOPY)
Anesthesia: Monitor Anesthesia Care

## 2014-02-17 MED ORDER — ACETAMINOPHEN 650 MG RE SUPP: 650.0000 mg | Freq: Four times a day (QID) | RECTAL | Status: DC | PRN

## 2014-02-17 MED ORDER — FUROSEMIDE 10 MG/ML IJ SOLN
20.0000 mg | Freq: Once | INTRAMUSCULAR | Status: AC
  Administered 2014-02-17: 20 mg via INTRAVENOUS
  Filled 2014-02-17: qty 2

## 2014-02-17 MED ORDER — PANTOPRAZOLE SODIUM 40 MG IV SOLR: 40.0000 mg | Freq: Two times a day (BID) | INTRAVENOUS | Status: DC

## 2014-02-17 MED ORDER — HYDROCODONE-ACETAMINOPHEN 5-325 MG PO TABS: 1.0000 | ORAL_TABLET | ORAL | Status: DC | PRN

## 2014-02-17 MED ORDER — MIDAZOLAM HCL 5 MG/5ML IJ SOLN
INTRAMUSCULAR | Status: DC | PRN
  Administered 2014-02-17: 2 mg via INTRAVENOUS

## 2014-02-17 MED ORDER — BUTAMBEN-TETRACAINE-BENZOCAINE 2-2-14 % EX AERO
INHALATION_SPRAY | CUTANEOUS | Status: DC | PRN
  Administered 2014-02-17: 2 via TOPICAL

## 2014-02-17 MED ORDER — MORPHINE SULFATE 2 MG/ML IJ SOLN: 1.0000 mg | INTRAMUSCULAR | Status: DC | PRN

## 2014-02-17 MED ORDER — ONDANSETRON HCL 4 MG/2ML IJ SOLN
INTRAMUSCULAR | Status: DC | PRN
  Administered 2014-02-17: 4 mg via INTRAVENOUS

## 2014-02-17 MED ORDER — PANTOPRAZOLE SODIUM 40 MG PO TBEC
40.0000 mg | DELAYED_RELEASE_TABLET | Freq: Every day | ORAL | Status: DC
  Administered 2014-02-17 – 2014-02-18 (×2): 40 mg via ORAL
  Filled 2014-02-17: qty 1

## 2014-02-17 MED ORDER — SODIUM CHLORIDE 0.9 % IJ SOLN
3.0000 mL | Freq: Two times a day (BID) | INTRAMUSCULAR | Status: DC
  Administered 2014-02-17 (×3): 3 mL via INTRAVENOUS

## 2014-02-17 MED ORDER — SODIUM CHLORIDE 0.9 % IV SOLN: INTRAVENOUS | Status: DC

## 2014-02-17 MED ORDER — ONDANSETRON HCL 4 MG PO TABS
4.0000 mg | ORAL_TABLET | Freq: Four times a day (QID) | ORAL | Status: DC | PRN
Start: 2014-02-17 — End: 2014-02-18

## 2014-02-17 MED ORDER — SODIUM CHLORIDE 0.9 % IV SOLN
80.0000 mg | Freq: Once | INTRAVENOUS | Status: AC
  Administered 2014-02-17: 80 mg via INTRAVENOUS
  Filled 2014-02-17: qty 80

## 2014-02-17 MED ORDER — IOHEXOL 300 MG/ML  SOLN
25.0000 mL | INTRAMUSCULAR | Status: AC
  Administered 2014-02-17 (×2): 25 mL via ORAL

## 2014-02-17 MED ORDER — INSULIN ASPART 100 UNIT/ML ~~LOC~~ SOLN
0.0000 [IU] | Freq: Three times a day (TID) | SUBCUTANEOUS | Status: DC
  Administered 2014-02-17: 2 [IU] via SUBCUTANEOUS

## 2014-02-17 MED ORDER — SODIUM CHLORIDE 0.9 % IV SOLN
8.0000 mg/h | INTRAVENOUS | Status: DC
  Administered 2014-02-17: 8 mg/h via INTRAVENOUS
  Filled 2014-02-17 (×4): qty 80

## 2014-02-17 MED ORDER — PANTOPRAZOLE SODIUM 40 MG IV SOLR
40.0000 mg | Freq: Once | INTRAVENOUS | Status: AC
  Administered 2014-02-17: 40 mg via INTRAVENOUS
  Filled 2014-02-17: qty 40

## 2014-02-17 MED ORDER — NITROGLYCERIN 0.4 MG SL SUBL: 0.4000 mg | SUBLINGUAL_TABLET | SUBLINGUAL | Status: DC | PRN

## 2014-02-17 MED ORDER — FENTANYL CITRATE 0.05 MG/ML IJ SOLN
25.0000 ug | INTRAMUSCULAR | Status: DC | PRN
Start: 2014-02-17 — End: 2014-02-18

## 2014-02-17 MED ORDER — ONDANSETRON HCL 4 MG/2ML IJ SOLN: 4.0000 mg | Freq: Three times a day (TID) | INTRAMUSCULAR | Status: DC | PRN

## 2014-02-17 MED ORDER — FUROSEMIDE 20 MG PO TABS
20.0000 mg | ORAL_TABLET | Freq: Every morning | ORAL | Status: DC
  Filled 2014-02-17: qty 1

## 2014-02-17 MED ORDER — PANTOPRAZOLE SODIUM 40 MG PO TBEC: 40.0000 mg | DELAYED_RELEASE_TABLET | Freq: Every day | ORAL | Status: DC

## 2014-02-17 MED ORDER — SODIUM CHLORIDE 0.9 % IV SOLN
INTRAVENOUS | Status: DC | PRN
  Administered 2014-02-17: 10:00:00 via INTRAVENOUS

## 2014-02-17 MED ORDER — IOHEXOL 300 MG/ML  SOLN
100.0000 mL | Freq: Once | INTRAMUSCULAR | Status: AC | PRN
  Administered 2014-02-17: 100 mL via INTRAVENOUS

## 2014-02-17 MED ORDER — POTASSIUM CHLORIDE CRYS ER 20 MEQ PO TBCR
20.0000 meq | EXTENDED_RELEASE_TABLET | Freq: Two times a day (BID) | ORAL | Status: DC
  Administered 2014-02-17 – 2014-02-18 (×3): 20 meq via ORAL
  Filled 2014-02-17 (×4): qty 1

## 2014-02-17 MED ORDER — FUROSEMIDE 40 MG PO TABS
40.0000 mg | ORAL_TABLET | Freq: Every morning | ORAL | Status: DC
  Administered 2014-02-18: 40 mg via ORAL
  Filled 2014-02-17: qty 1

## 2014-02-17 MED ORDER — ONDANSETRON HCL 4 MG/2ML IJ SOLN: 4.0000 mg | Freq: Four times a day (QID) | INTRAMUSCULAR | Status: DC | PRN

## 2014-02-17 MED ORDER — ACETAMINOPHEN 325 MG PO TABS: 650.0000 mg | ORAL_TABLET | Freq: Four times a day (QID) | ORAL | Status: DC | PRN

## 2014-02-17 MED ORDER — SODIUM CHLORIDE 0.9 % IV SOLN
INTRAVENOUS | Status: DC
  Administered 2014-02-17: 02:00:00 via INTRAVENOUS

## 2014-02-17 MED ORDER — PROPOFOL INFUSION 10 MG/ML OPTIME
INTRAVENOUS | Status: DC | PRN
  Administered 2014-02-17: 100 ug/kg/min via INTRAVENOUS

## 2014-02-17 NOTE — Anesthesia Preprocedure Evaluation (Signed)
Anesthesia Evaluation  Patient identified by MRN, date of birth, ID band Patient awake    Reviewed: Allergy & Precautions, H&P , NPO status , Patient's Chart, lab work & pertinent test results  Airway Mallampati: III TM Distance: >3 FB Neck ROM: Full    Dental no notable dental hx. (+) Edentulous Upper, Edentulous Lower, Dental Advisory Given   Pulmonary neg pulmonary ROS, sleep apnea , former smoker,  breath sounds clear to auscultation  Pulmonary exam normal       Cardiovascular hypertension, On Medications + CAD and + Cardiac Stents Rhythm:Regular Rate:Normal     Neuro/Psych negative neurological ROS  negative psych ROS   GI/Hepatic Neg liver ROS, GERD-  Medicated,  Endo/Other  diabetes, Type 2, Oral Hypoglycemic AgentsMorbid obesity  Renal/GU negative Renal ROS  negative genitourinary   Musculoskeletal   Abdominal   Peds  Hematology negative hematology ROS (+) anemia ,   Anesthesia Other Findings   Reproductive/Obstetrics negative OB ROS                           Anesthesia Physical Anesthesia Plan  ASA: III  Anesthesia Plan: MAC   Post-op Pain Management:    Induction: Intravenous  Airway Management Planned: Nasal Cannula  Additional Equipment:   Intra-op Plan:   Post-operative Plan:   Informed Consent: I have reviewed the patients History and Physical, chart, labs and discussed the procedure including the risks, benefits and alternatives for the proposed anesthesia with the patient or authorized representative who has indicated his/her understanding and acceptance.   Dental advisory given  Plan Discussed with: CRNA  Anesthesia Plan Comments:         Anesthesia Quick Evaluation

## 2014-02-17 NOTE — Consult Note (Signed)
Reason for Consult: Guaiac positive anemia Referring Physician: Hospital team  [name] is an 68 y.o. male.  HPI: Patient seen by my partners in the past with an endoscopy showing small ulcers and erosions and a colonoscopy in December with hyperplastic polyps but 2 small AVMs and he has not seen any bleeding and he does not have any GI complaint and he denies any GI problems in the family and he does say he is on a stomach prescription and he has no other complaints and had no problems with the previous procedures  Past Medical History  Diagnosis Date  . Unspecified essential hypertension   . Coronary artery disease CARDIOLOGIST-  DR Cathie Olden    PTCA/DES to LAD 06/28/12  . S/P drug eluting coronary stent placement     06-28-2012  X1  DES  PROXIMAL LAD  . Type 2 diabetes mellitus   . GERD (gastroesophageal reflux disease)   . Phimosis   . History of kidney stones   . Hyperlipidemia   . History of concussion     1976--  NO RESIDUAL  . Iron deficiency anemia   . History of GI bleed     UPPER GASTRIC ULCER 2 YRS AGO-- NO ISSUES  . History of gout     2007 &  2008  LEFT LEG-- NO ISSUE SINCE  . Chronic low back pain   . Arthritis   . OA (osteoarthritis of spine)     LOWER BACK--  INTERMITTANT LEFT LEG NUMBNESS  . OSA (obstructive sleep apnea)     PULMOLOGIST-  DR CLANCE--  MODERATE OSA  STARTED CPAP 2012--  BUT CURRENTLY HAS NOT USED PAST 6 MONTHS    Past Surgical History  Procedure Laterality Date  . Esophagogastroduodenoscopy  08/15/2012    Procedure: ESOPHAGOGASTRODUODENOSCOPY (EGD);  Surgeon: Wonda Horner, MD;  Location: [facility] ENDOSCOPY;  Service: Endoscopy;  Laterality: N/A;  . Orif right ankle fx  1989  . Laparoscopic umbilical hernia repair w/ mesh  06-06-2011  . Open appendectomy w/ partial cecectomy  05-16-2004  . Dobutamine stress echo  06-08-2012    MODERATE HYPOKINESIS/ ISCHEMIA MID INFERIOR WALL  . Coronary angioplasty with stent placement  06/28/2012  DR COOPER   PCI W/  X1 DES to [place]. LAD/  LM  40% OSTIAL & 50-60% DISTAL /  50% PROX LCX/  30-40% PROX RCA & 50% MID RCA/   LVEF 65-70%  . Nephrolithotomy  1990'S  . Circumcision N/A 09/09/2013    Procedure: CIRCUMCISION ADULT;  Surgeon: Bernestine Amass, MD;  Location: [facility];  Service: Urology;  Laterality: N/A;    Family History  Problem Relation Age of Onset  . Coronary artery disease    . Diabetes    . Colon cancer    . Lung cancer Sister   . Cancer Sister     lung  . Cancer Brother     lung  . Cancer Sister     lung    Social History:  reports that he quit smoking about 17 years ago. His smoking use included Cigarettes. He has a 80 pack-year smoking history. He has never used smokeless tobacco. He reports that he drinks about 1.2 ounces of alcohol per week. He reports that he does not use illicit drugs.  Allergies: No Known Allergies  Medications: I have reviewed the patient's current medications.  Results for orders placed during the hospital encounter of 02/16/14 (from the past 48 hour(s))  CBC  Status: Abnormal   Collection Time    02/16/14 11:12 PM      Result Value Ref Range   WBC 4.2  4.0 - 10.5 K/uL   RBC 2.95 (*) 4.22 - 5.81 MIL/uL   Hemoglobin 8.0 (*) 13.0 - 17.0 g/dL   HCT 26.6 (*) 39.0 - 52.0 %   MCV 90.2  78.0 - 100.0 fL   MCH 27.1  26.0 - 34.0 pg   MCHC 30.1  30.0 - 36.0 g/dL   RDW 13.7  11.5 - 15.5 %   Platelets 131 (*) 150 - 400 K/uL  BASIC METABOLIC PANEL     Status: Abnormal   Collection Time    02/16/14 11:12 PM      Result Value Ref Range   Sodium 140  137 - 147 mEq/L   Potassium 3.9  3.7 - 5.3 mEq/L   Chloride 103  96 - 112 mEq/L   CO2 26  19 - 32 mEq/L   Glucose, Bld 167 (*) 70 - 99 mg/dL   BUN 16  6 - 23 mg/dL   Creatinine, Ser 0.66  0.50 - 1.35 mg/dL   Calcium 8.9  8.4 - 10.5 mg/dL   GFR calc non Af Amer >90  >90 mL/min   GFR calc Af Amer >90  >90 mL/min   Comment: (NOTE)     The eGFR has been calculated using the CKD EPI  equation.     This calculation has not been validated in all clinical situations.     eGFR's persistently <90 mL/min signify possible Chronic Kidney     Disease.  PROTIME-INR     Status: None   Collection Time    02/16/14 11:12 PM      Result Value Ref Range   Prothrombin Time 14.1  11.6 - 15.2 seconds   INR 1.11  0.00 - 1.49  LIPASE, BLOOD     Status: None   Collection Time    02/16/14 11:12 PM      Result Value Ref Range   Lipase 37  11 - 59 U/L  TROPONIN I     Status: None   Collection Time    02/16/14 11:13 PM      Result Value Ref Range   Troponin I <0.30  <0.30 ng/mL   Comment:            Due to the release kinetics of cTnI,     a negative result within the first hours     of the onset of symptoms does not rule out     myocardial infarction with certainty.     If myocardial infarction is still suspected,     repeat the test at appropriate intervals.  HEPATIC FUNCTION PANEL     Status: Abnormal   Collection Time    02/16/14 11:38 PM      Result Value Ref Range   Total Protein 6.6  6.0 - 8.3 g/dL   Albumin 3.3 (*) 3.5 - 5.2 g/dL   AST 20  0 - 37 U/L   ALT 13  0 - 53 U/L   Alkaline Phosphatase 93  39 - 117 U/L   Total Bilirubin 0.2 (*) 0.3 - 1.2 mg/dL   Bilirubin, Direct <0.2  0.0 - 0.3 mg/dL   Indirect Bilirubin NOT CALCULATED  0.3 - 0.9 mg/dL  POC OCCULT BLOOD, ED     Status: Abnormal   Collection Time    02/17/14 12:49 AM  Result Value Ref Range   Fecal Occult Bld POSITIVE (*) NEGATIVE  TROPONIN I     Status: None   Collection Time    02/17/14  1:00 AM      Result Value Ref Range   Troponin I <0.30  <0.30 ng/mL   Comment:            Due to the release kinetics of cTnI,     a negative result within the first hours     of the onset of symptoms does not rule out     myocardial infarction with certainty.     If myocardial infarction is still suspected,     repeat the test at appropriate intervals.  TYPE AND SCREEN     Status: None   Collection Time     02/17/14  1:00 AM      Result Value Ref Range   ABO/RH(D) O POS     Antibody Screen NEG     Sample Expiration 02/20/2014    GLUCOSE, CAPILLARY     Status: Abnormal   Collection Time    02/17/14  3:22 AM      Result Value Ref Range   Glucose-Capillary 140 (*) 70 - 99 mg/dL  CBC     Status: Abnormal   Collection Time    02/17/14  3:58 AM      Result Value Ref Range   WBC 3.3 (*) 4.0 - 10.5 K/uL   RBC 2.82 (*) 4.22 - 5.81 MIL/uL   Hemoglobin 7.7 (*) 13.0 - 17.0 g/dL   HCT 25.4 (*) 39.0 - 52.0 %   MCV 90.1  78.0 - 100.0 fL   MCH 27.3  26.0 - 34.0 pg   MCHC 30.3  30.0 - 36.0 g/dL   RDW 13.8  11.5 - 15.5 %   Platelets 119 (*) 150 - 400 K/uL   Comment: SPECIMEN CHECKED FOR CLOTS     REPEATED TO VERIFY     PLATELET COUNT CONFIRMED BY SMEAR  TROPONIN I     Status: None   Collection Time    02/17/14  3:58 AM      Result Value Ref Range   Troponin I <0.30  <0.30 ng/mL   Comment:            Due to the release kinetics of cTnI,     a negative result within the first hours     of the onset of symptoms does not rule out     myocardial infarction with certainty.     If myocardial infarction is still suspected,     repeat the test at appropriate intervals.  GLUCOSE, CAPILLARY     Status: Abnormal   Collection Time    02/17/14  7:45 AM      Result Value Ref Range   Glucose-Capillary 110 (*) 70 - 99 mg/dL  TROPONIN I     Status: None   Collection Time    02/17/14  8:34 AM      Result Value Ref Range   Troponin I <0.30  <0.30 ng/mL   Comment:            Due to the release kinetics of cTnI,     a negative result within the first hours     of the onset of symptoms does not rule out     myocardial infarction with certainty.     If myocardial infarction is still suspected,     repeat the test at  appropriate intervals.    No results found.  ROS negative except above he is currently not having any pain Blood pressure 122/57, pulse 71, temperature 98.2 F (36.8 C), temperature  source Oral, resp. rate 18, height '5\' 8"'  (1.727 m), weight 116.5 kg (256 lb 13.4 oz), SpO2 100.00%. Physical Exam vital signs stable afebrile no acute distress please see pre-assessment evaluation labs and hospital computer and office computer chart reviewed as well as previous endoscopic procedures  Assessment/Plan: Multiple medical problems including recurrent guaiac positive anemia in patient on aspirin and blood thinners Plan: The risks benefits methods of endoscopy was discussed with the patient and will proceed later today with further workup and plans pending those findings  [place] E 02/17/2014, 9:58 AM

## 2014-02-17 NOTE — Op Note (Signed)
[facility] [place], [id number]   ENDOSCOPY PROCEDURE REPORT  PATIENT: [name], [name]  MR#: [id number] BIRTHDATE: [birth year] , 15  yrs. old GENDER: Male  ENDOSCOPIST: Clarene Essex, MD REFERRED [id number] Asa Lente, M.D.  PROCEDURE DATE:  02/17/2014 PROCEDURE:   EGD w/ biopsy ASA CLASS:   Class III INDICATIONS:Acute post hemorrhagic anemia.  MEDICATIONS: propofol (Diprivan) 90mg  IV and Versed 2 mg IV  TOPICAL ANESTHETIC:used  DESCRIPTION OF PROCEDURE:   After the risks benefits and alternatives of the procedure were thoroughly explained, informed consent was obtained.  The Pentax Gastroscope [id number]  endoscope was introduced through the mouth and advanced to the third or forth portion of the duodenum , limited by Without limitations.   The instrument was slowly withdrawn as the mucosa was fully examined.the findings are recorded below and the patient tolerated the procedure well there was no obvious immediate complication         FINDINGS:1 tiny hiatal hernia 2. Questionable 1 small island of Barrett's in the distal esophagus status post biopsy 3 questionable proximal portal gastropathy status post biopsy #4 otherwise within normal limits to the third or fourth part of the duodenum without signs of active bleeding  COMPLICATIONS: none  ENDOSCOPIC IMPRESSION:above   RECOMMENDATIONS:await biopsy consider CT of the abdomen and pelvis not only to rule out any significant lesions but to confirm liver disease and possibly enlarged spleen to explain his low platelet count and okay to advance his diet and ask cardiology to reassess his blood thinners needs and make sure he is on a pump inhibitor at home and we are happy to see back when necessary   REPEAT EXAM: as needed   _______________________________ Clarene Essex, MD eSigned:  Clarene Essex, MD 02/17/2014 10:58 AM    HA:[id number] Loni Muse Asa Lente, MD  PATIENT NAME:  Vinal, Rosengrant MR#: [id number]

## 2014-02-17 NOTE — Progress Notes (Signed)
PROGRESS NOTE  SHAW DOBEK PQD:[id number] DOB: July 24, 1946 DOA: 02/16/2014 PCP: Gwendolyn Grant, MD  HPI/Subjective: Cali Cuartas a 68 year old male with past medical history of CAD PTCA/DES to LAD 2013, Diabetes, Asthma, HTN, history of gastric Ulcer presented with severe epigastric pain. Pain is worse on exertion and post meals. Acute Labrie pain that comes for minutes at a time and is relieved by belching. Patient states melena (last episode on 6/20). Denies hematochezia and hematemesis. Complains of productive cough with sputum. Denies hemoptysis and dyspnea.   Assessment/Plan: Principle Problem GI Bleed with Acute blood loss anemia Guiac positive in ED. Started on IV Protonix.  Now progressed to daily oral PPI. Gastroenterology consulted - EGD done - portal gastropathy, with questionable island of barrett's (Biopsy done). Given one unit PRBC transfusion of blood Continue to monitor CBC (hematocrit, RBC)  Epigastric pain Secondary to portal gastropathy. Cardiac cause ruled out with troponin and ECG being normal Given IV Protonix.  Will progress to PPI daily. See above GI Bleed   Lower extremity edema, thrombocytopenia Possible liver disease.  Mild cirrhosis and splenomegaly on 2014 CT Abd. Per GI rec will eval with repeat CT abdomen/pelvis Low salt diet. Start daily diuretics.  HTN Hold Cozaar, BP is low normal.   continue with lasix to avoid volume overload Continue to monitor BP  CAD Hold Aspirin and effient due to concern for GI bleed  DES placed 05/2012.   Effient has since been discontinued.  Will not restart on discharge.  DM Started on SSI Continue to monitor CBG On Metformin at home.  Will hold for 48 hours after receiving IV contrast.  DVT Prophylaxis:  SCDs  Code Status: Full Family Communication: Patient is alert and oriented. Talked with patients son on the phone. Disposition Plan: Remain inpatient    Consultants:  Gastroenterology, [facility]ie [place]er.    Procedures:  EDG   Antibiotics: Anti-infectives   None      Objective: Filed Vitals:   02/17/14 0949 02/17/14 1103 02/17/14 1105 02/17/14 1112  BP: 122/57 106/52  105/49  Pulse: 71 70 70 67  Temp: 98.2 F (36.8 C) 97.8 F (36.6 C)    TempSrc: Oral Oral    Resp: 18 18 18 17   Height:      Weight:      SpO2: 100% 100% 100% 100%    Intake/Output Summary (Last 24 hours) at 02/17/14 1153 Last data filed at 02/17/14 1058  Gross per 24 hour  Intake    100 ml  Output      0 ml  Net    100 ml   Filed Weights   02/16/14 2302 02/17/14 0317  Weight: 108.863 kg (240 lb) 116.5 kg (256 lb 13.4 oz)    Exam: General: Well developed, well nourished, NAD.  Lying comfortably in bed. HEENT:  Anicteic Sclera, MMM. No pharyngeal erythema or exudates  Neck: Supple, no JVD, no masses  Cardiovascular: RRR, S1 S2 auscultated, no rubs, murmurs or gallops.   Respiratory: Diffuse wheezing no crackles. Equal chest rise Abdomen: Soft, nontender, nondistended, + bowel sounds  Extremities: Pitting edema 2-3+ bilaterally lower extremities. Warm dry without cyanosis.  Neuro: Grossly able to move extremities  Skin: Without rashes exudates or nodules.      Data Reviewed: Basic Metabolic Panel:  Recent Labs Lab 02/16/14 2312  NA 140  K 3.9  CL 103  CO2 26  GLUCOSE 167*  BUN 16  CREATININE 0.66  CALCIUM 8.9   Liver Function Tests:  Recent Labs Lab 02/16/14 2338  AST 20  ALT 13  ALKPHOS 93  BILITOT 0.2*  PROT 6.6  ALBUMIN 3.3*    Recent Labs Lab 02/16/14 2312  LIPASE 37   CBC:  Recent Labs Lab 02/16/14 2312 02/17/14 0358  WBC 4.2 3.3*  HGB 8.0* 7.7*  HCT 26.6* 25.4*  MCV 90.2 90.1  PLT 131* 119*   Cardiac Enzymes:  Recent Labs Lab 02/16/14 2313 02/17/14 0100 02/17/14 0358 02/17/14 0834  TROPONINI <0.30 <0.30 <0.30 <0.30   CBG:  Recent Labs Lab 02/17/14 0322 02/17/14 0745  GLUCAP 140* 110*     Studies: No results found.  Scheduled  Meds: . furosemide  20 mg Intravenous Once  . [START ON 02/18/2014] furosemide  40 mg Oral q morning - 10a  . insulin aspart  0-9 Units Subcutaneous TID WC  . pantoprazole  40 mg Oral Daily  . potassium chloride  20 mEq Oral BID  . sodium chloride  3 mL Intravenous Q12H   Continuous Infusions:    Active Problems:   DIABETES MELLITUS, TYPE II-controlled   HYPERTENSION   GI bleed   Ferne Reus, PA-S  Imogene Burn, [place] [facility] Pager [phone]. If 7PM-7AM, please contact night-coverage at [email or web address], password [email or web address] 02/17/2014, 11:53 AM  LOS: 1 day   Attending Patient was seen, examined,treatment plan was discussed with the Physician extender. I have directly reviewed the clinical findings, lab, imaging studies and management of this patient in detail. I have made the necessary changes to the above noted documentation, and agree with the documentation, as recorded by the Physician extender.  Nena Alexander MD [facility].

## 2014-02-17 NOTE — Plan of Care (Signed)
Problem: Phase I Progression Outcomes Goal: Initial discharge plan identified Outcome: Completed/Met Date Met:  02/17/14 To return home

## 2014-02-17 NOTE — ED Provider Notes (Signed)
CSN: [id number]     Arrival date & time 02/16/14  2254 History   First MD Initiated Contact with Patient 02/16/14 2337     Chief Complaint  Patient presents with  . Abdominal Pain     (Consider location/radiation/quality/duration/timing/severity/associated sxs/prior Treatment) HPI Comments: 68 year old male with history of diabetes, and high blood pressure, reflux, sleep apnea, CAD with LAD stent, lipids, hernia repair history, ulcer presents with intermittent epigastric discomfort for the past 2 days. Pain improves with belching. Patient feels this pain is mildly similar to prior to requiring cardiac stent however he also has pain with eating and possibly similar to his ulcer history in the past. Patient has had mild brief epigastric discomfort with walking that lasted roughly 5-10 minutes. Patient denies any upper chest pain or shortness of breath. No recent surgeries or leg pain. Patient denies GI bleeding or vomiting. Symptoms are nonradiating  Patient is a 68 y.o. male presenting with abdominal pain. The history is provided by the patient.  Abdominal Pain Associated symptoms: no chest pain, no chills, no dysuria, no fever, no nausea, no shortness of breath and no vomiting     Past Medical History  Diagnosis Date  . Unspecified essential hypertension   . Coronary artery disease CARDIOLOGIST-  DR Cathie Olden    PTCA/DES to LAD 06/28/12  . S/P drug eluting coronary stent placement     06-28-2012  X1  DES  PROXIMAL LAD  . Type 2 diabetes mellitus   . GERD (gastroesophageal reflux disease)   . Phimosis   . History of kidney stones   . Hyperlipidemia   . History of concussion     1976--  NO RESIDUAL  . Iron deficiency anemia   . History of GI bleed     UPPER GASTRIC ULCER 2 YRS AGO-- NO ISSUES  . History of gout     2007 &  2008  LEFT LEG-- NO ISSUE SINCE  . Chronic low back pain   . Arthritis   . OA (osteoarthritis of spine)     LOWER BACK--  INTERMITTANT LEFT LEG NUMBNESS  . OSA  (obstructive sleep apnea)     PULMOLOGIST-  DR CLANCE--  MODERATE OSA  STARTED CPAP 2012--  BUT CURRENTLY HAS NOT USED PAST 6 MONTHS   Past Surgical History  Procedure Laterality Date  . Esophagogastroduodenoscopy  08/15/2012    Procedure: ESOPHAGOGASTRODUODENOSCOPY (EGD);  Surgeon: Wonda Horner, MD;  Location: [facility] ENDOSCOPY;  Service: Endoscopy;  Laterality: N/A;  . Orif right ankle fx  1989  . Laparoscopic umbilical hernia repair w/ mesh  06-06-2011  . Open appendectomy w/ partial cecectomy  05-16-2004  . Dobutamine stress echo  06-08-2012    MODERATE HYPOKINESIS/ ISCHEMIA MID INFERIOR WALL  . Coronary angioplasty with stent placement  06/28/2012  DR COOPER    PCI W/  X1 DES to [place]. LAD/  LM  40% OSTIAL & 50-60% DISTAL /  50% PROX LCX/  30-40% PROX RCA & 50% MID RCA/   LVEF 65-70%  . Nephrolithotomy  1990'S  . Circumcision N/A 09/09/2013    Procedure: CIRCUMCISION ADULT;  Surgeon: Bernestine Amass, MD;  Location: [facility];  Service: Urology;  Laterality: N/A;   Family History  Problem Relation Age of Onset  . Coronary artery disease    . Diabetes    . Colon cancer    . Lung cancer Sister   . Cancer Sister     lung  . Cancer Brother  lung  . Cancer Sister     lung   History  Substance Use Topics  . Smoking status: Former Smoker -- 2.00 packs/day for 40 years    Types: Cigarettes    Quit date: 08/29/1996  . Smokeless tobacco: Never Used  . Alcohol Use: 1.2 oz/week    2 Cans of beer per week    Review of Systems  Constitutional: Negative for fever and chills.  HENT: Negative for congestion.   Eyes: Negative for visual disturbance.  Respiratory: Negative for shortness of breath.   Cardiovascular: Negative for chest pain.  Gastrointestinal: Positive for abdominal pain. Negative for nausea and vomiting.  Genitourinary: Negative for dysuria and flank pain.  Musculoskeletal: Negative for back pain, neck pain and neck stiffness.  Skin: Negative for rash.   Neurological: Negative for light-headedness and headaches.      Allergies  Review of patient's allergies indicates no known allergies.  Home Medications   Prior to Admission medications   Medication Sig Start Date End Date Taking? Authorizing Provider  aspirin 81 MG chewable tablet Chew 81 mg by mouth daily.   Yes Historical Provider, MD  ferrous sulfate 325 (65 FE) MG tablet Take 325 mg by mouth daily.    Yes Historical Provider, MD  furosemide (LASIX) 20 MG tablet Take 20 mg by mouth every morning. 01/14/13  Yes Thayer Headings, MD  losartan (COZAAR) 50 MG tablet Take 1 tablet (50 mg total) by mouth daily.   Yes Rowe Clack, MD  metFORMIN (GLUCOPHAGE) 500 MG tablet Take 500 mg by mouth 2 (two) times daily with a meal.   Yes Historical Provider, MD  nitroGLYCERIN (NITROSTAT) 0.4 MG SL tablet Place 1 tablet (0.4 mg total) under the tongue every 5 (five) minutes as needed for chest pain. 06/29/12  Yes Rhonda G Barrett, PA-C  prasugrel (EFFIENT) 5 MG TABS tablet Take 5 mg by mouth daily.   Yes Historical Provider, MD   BP 135/66  Pulse 77  Temp(Src) 98.3 F (36.8 C) (Oral)  Resp 18  Ht 5\' 8"  (1.727 m)  Wt 240 lb (108.863 kg)  BMI 36.50 kg/m2  SpO2 100% Physical Exam  Nursing note and vitals reviewed. Constitutional: He is oriented to person, place, and time. He appears well-developed and well-nourished.  HENT:  Head: Normocephalic and atraumatic.  Mild dry mucous membranes  Eyes: Conjunctivae are normal. Right eye exhibits no discharge. Left eye exhibits no discharge.  Neck: Normal range of motion. Neck supple. No tracheal deviation present.  Cardiovascular: Normal rate, regular rhythm and intact distal pulses.   Pulmonary/Chest: Effort normal and breath sounds normal.  Abdominal: Soft. He exhibits no distension. There is tenderness (mild epigastrium). There is no guarding.  Musculoskeletal: He exhibits edema (mild bilateral lower extremities chronic). He exhibits no  tenderness.  Neurological: He is alert and oriented to person, place, and time.  Skin: Skin is warm. No rash noted.  Psychiatric: He has a normal mood and affect.    ED Course  Procedures (including critical care time) Labs Review Labs Reviewed  CBC - Abnormal; Notable for the following:    RBC 2.95 (*)    Hemoglobin 8.0 (*)    HCT 26.6 (*)    Platelets 131 (*)    All other components within normal limits  BASIC METABOLIC PANEL - Abnormal; Notable for the following:    Glucose, Bld 167 (*)    All other components within normal limits  HEPATIC FUNCTION PANEL - Abnormal; Notable for  the following:    Albumin 3.3 (*)    Total Bilirubin 0.2 (*)    All other components within normal limits  CBC - Abnormal; Notable for the following:    WBC 3.3 (*)    RBC 2.82 (*)    Hemoglobin 7.7 (*)    HCT 25.4 (*)    Platelets 119 (*)    All other components within normal limits  GLUCOSE, CAPILLARY - Abnormal; Notable for the following:    Glucose-Capillary 140 (*)    All other components within normal limits  GLUCOSE, CAPILLARY - Abnormal; Notable for the following:    Glucose-Capillary 110 (*)    All other components within normal limits  POC OCCULT BLOOD, ED - Abnormal; Notable for the following:    Fecal Occult Bld POSITIVE (*)    All other components within normal limits  PROTIME-INR  TROPONIN I  TROPONIN I  LIPASE, BLOOD  TROPONIN I  CBC  CBC  TROPONIN I  TROPONIN I  TYPE AND SCREEN    Imaging Review No results found.   EKG Interpretation   Date/Time:  Sunday February 16 2014 22:57:37 EDT Ventricular Rate:  78 PR Interval:  206 QRS Duration: 96 QT Interval:  372 QTC Calculation: 424 R Axis:   100 Text Interpretation:  Normal sinus rhythm Rightward axis Incomplete right  bundle branch block Nonspecific ST abnormality Abnormal ECG Similar to  previous Confirmed by ZAVITZ  MD, JOSHUA ([id number]) on 02/17/2014 12:11:35 AM      MDM   Final diagnoses:  History of ulcer  disease  Upper GI bleed  Anemia, blood loss   Patient with known CAD and stents with ulcer history presents with epigastric discomfort. Discussed differential including cardiac, ulcer, reflux, biliary. EKG reviewed similar to previous. Hemoglobin dropped to 8 which makes me concerned for recurrent ulcer history. Hemoccult pending. No pain currently. Cardiac disease history and pain that is similar as well as ulcer history of dropping hemoglobin plan for admission to the hospital. Hospitalist will be paged  The patients results and plan were reviewed and discussed.   Any x-rays performed were personally reviewed by myself.   Differential diagnosis were considered with the presenting HPI.  Medications  0.9 %  sodium chloride infusion ( Intravenous New Bag/Given 02/17/14 0215)  ondansetron (ZOFRAN) injection 4 mg (not administered)  nitroGLYCERIN (NITROSTAT) SL tablet 0.4 mg (not administered)  sodium chloride 0.9 % injection 3 mL (3 mLs Intravenous Given 02/17/14 0320)  acetaminophen (TYLENOL) tablet 650 mg (not administered)    Or  acetaminophen (TYLENOL) suppository 650 mg (not administered)  HYDROcodone-acetaminophen (NORCO/VICODIN) 5-325 MG per tablet 1-2 tablet (not administered)  morphine 2 MG/ML injection 1 mg (not administered)  ondansetron (ZOFRAN) tablet 4 mg (not administered)    Or  ondansetron (ZOFRAN) injection 4 mg (not administered)  pantoprazole (PROTONIX) 80 mg in sodium chloride 0.9 % 250 mL infusion (8 mg/hr Intravenous New Bag/Given 02/17/14 0402)  pantoprazole (PROTONIX) injection 40 mg (not administered)  insulin aspart (novoLOG) injection 0-9 Units (0 Units Subcutaneous Not Given 02/17/14 0810)  furosemide (LASIX) tablet 20 mg (not administered)  pantoprazole (PROTONIX) injection 40 mg (40 mg Intravenous Given 02/17/14 0113)  pantoprazole (PROTONIX) 80 mg in sodium chloride 0.9 % 100 mL IVPB (80 mg Intravenous Given 02/17/14 0400)      Filed Vitals:   02/16/14 2302  02/16/14 2359  BP: 148/54 135/66  Pulse: 79 77  Temp: 98.3 F (36.8 C)   TempSrc: Oral   Resp:  20 18  Height: 5\' 8"  (1.727 m)   Weight: 240 lb (108.863 kg)   SpO2: 100% 100%    Admission/ observation were discussed with the admitting physician, patient and/or family and they are comfortable with the plan.      Mariea Clonts, MD 02/17/14 [phone]

## 2014-02-17 NOTE — H&P (Addendum)
[facility] History and Physical  [name] ZOX:[id number] DOB: 04/19/1946 DOA: 02/16/2014  Referring physician: ED physician.  PCP: Gwendolyn Grant, MD   Chief Complaint: abdominal pain.   HPI: [name] is a 68 y.o. male with PMH significant for CAD PTCA/DES to LAD 2013, Diabetes, HTN, history of gastric Ulcer, who presents to ED complaining of epigastric pain that started 1 week ago, he describe pain as [place], worse on exertion and meals, feels like gas. Feels similar to prior pain requiring cardiac stent.  He relates black stool, on and off last episode yesterday. He hematemesis, no hematochezia.    Review of Systems:  Negative, except as per HPI.   Past Medical History  Diagnosis Date  . Unspecified essential hypertension   . Coronary artery disease CARDIOLOGIST-  DR Cathie Olden    PTCA/DES to LAD 06/28/12  . S/P drug eluting coronary stent placement     06-28-2012  X1  DES  PROXIMAL LAD  . Type 2 diabetes mellitus   . GERD (gastroesophageal reflux disease)   . Phimosis   . History of kidney stones   . Hyperlipidemia   . History of concussion     1976--  NO RESIDUAL  . Iron deficiency anemia   . History of GI bleed     UPPER GASTRIC ULCER 2 YRS AGO-- NO ISSUES  . History of gout     2007 &  2008  LEFT LEG-- NO ISSUE SINCE  . Chronic low back pain   . Arthritis   . OA (osteoarthritis of spine)     LOWER BACK--  INTERMITTANT LEFT LEG NUMBNESS  . OSA (obstructive sleep apnea)     PULMOLOGIST-  DR CLANCE--  MODERATE OSA  STARTED CPAP 2012--  BUT CURRENTLY HAS NOT USED PAST 6 MONTHS   Past Surgical History  Procedure Laterality Date  . Esophagogastroduodenoscopy  08/15/2012    Procedure: ESOPHAGOGASTRODUODENOSCOPY (EGD);  Surgeon: Wonda Horner, MD;  Location: [facility] ENDOSCOPY;  Service: Endoscopy;  Laterality: N/A;  . Orif right ankle fx  1989  . Laparoscopic umbilical hernia repair w/ mesh  06-06-2011  . Open appendectomy w/ partial cecectomy  05-16-2004  .  Dobutamine stress echo  06-08-2012    MODERATE HYPOKINESIS/ ISCHEMIA MID INFERIOR WALL  . Coronary angioplasty with stent placement  06/28/2012  DR COOPER    PCI W/  X1 DES to [place]. LAD/  LM  40% OSTIAL & 50-60% DISTAL /  50% PROX LCX/  30-40% PROX RCA & 50% MID RCA/   LVEF 65-70%  . Nephrolithotomy  1990'S  . Circumcision N/A 09/09/2013    Procedure: CIRCUMCISION ADULT;  Surgeon: Bernestine Amass, MD;  Location: [facility];  Service: Urology;  Laterality: N/A;   Social History:  reports that he quit smoking about 17 years ago. His smoking use included Cigarettes. He has a 80 pack-year smoking history. He has never used smokeless tobacco. He reports that he drinks about 1.2 ounces of alcohol per week. He reports that he does not use illicit drugs.  No Known Allergies  Family History  Problem Relation Age of Onset  . Coronary artery disease    . Diabetes    . Colon cancer    . Lung cancer Sister   . Cancer Sister     lung  . Cancer Brother     lung  . Cancer Sister     lung     Prior to Admission medications   Medication  Sig Start Date End Date Taking? Authorizing Daryl Quiros  aspirin 81 MG chewable tablet Chew 81 mg by mouth daily.   Yes Historical Divina Neale, MD  ferrous sulfate 325 (65 FE) MG tablet Take 325 mg by mouth daily.    Yes Historical Sevana Grandinetti, MD  furosemide (LASIX) 20 MG tablet Take 20 mg by mouth every morning. 01/14/13  Yes Thayer Headings, MD  losartan (COZAAR) 50 MG tablet Take 1 tablet (50 mg total) by mouth daily.   Yes Rowe Clack, MD  metFORMIN (GLUCOPHAGE) 500 MG tablet Take 500 mg by mouth 2 (two) times daily with a meal.   Yes Historical Alysah Carton, MD  nitroGLYCERIN (NITROSTAT) 0.4 MG SL tablet Place 1 tablet (0.4 mg total) under the tongue every 5 (five) minutes as needed for chest pain. 06/29/12  Yes Rhonda G Barrett, PA-C  prasugrel (EFFIENT) 5 MG TABS tablet Take 5 mg by mouth daily.   Yes Historical Zelina Jimerson, MD   Physical Exam: Filed  Vitals:   02/17/14 0200  BP: 131/40  Pulse: 79  Temp:   Resp: 23    BP 131/40  Pulse 79  Temp(Src) 98.3 F (36.8 C) (Oral)  Resp 23  Ht 5\' 8"  (1.727 m)  Wt 108.863 kg (240 lb)  BMI 36.50 kg/m2  SpO2 96%  General:  Appears calm and comfortable Eyes: PERRL, normal lids, irises & conjunctiva ENT: grossly normal hearing, lips & tongue Neck: no LAD, masses or thyromegaly Cardiovascular: RRR, no m/r/g. Positive edema LE.  Telemetry: SR, no arrhythmias  Respiratory: CTA bilaterally, no w/r/r. Normal respiratory effort. Abdomen: soft, mild epigastric tenderness, no rigidity, no guarding, obese.  Skin: no rash or induration seen on limited exam Musculoskeletal: grossly normal tone BUE/BLE Psychiatric: grossly normal mood and affect, speech fluent and appropriate Neurologic: grossly non-focal.          Labs on Admission:  Basic Metabolic Panel:  Recent Labs Lab 02/16/14 2312  NA 140  K 3.9  CL 103  CO2 26  GLUCOSE 167*  BUN 16  CREATININE 0.66  CALCIUM 8.9   Liver Function Tests:  Recent Labs Lab 02/16/14 2338  AST 20  ALT 13  ALKPHOS 93  BILITOT 0.2*  PROT 6.6  ALBUMIN 3.3*    Recent Labs Lab 02/16/14 2312  LIPASE 37   No results found for this basename: AMMONIA,  in the last 168 hours CBC:  Recent Labs Lab 02/16/14 2312  WBC 4.2  HGB 8.0*  HCT 26.6*  MCV 90.2  PLT 131*   Cardiac Enzymes:  Recent Labs Lab 02/16/14 2313 02/17/14 0100  TROPONINI <0.30 <0.30    BNP (last 3 results) No results found for this basename: PROBNP,  in the last 8760 hours CBG: No results found for this basename: GLUCAP,  in the last 168 hours  Radiological Exams on Admission: No results found.  EKG: Independently reviewed. Sinus rhythm, old incomplete RBBB.   Assessment/Plan Active Problems:   GI bleed  1-Epigastric pain; will admit to hospital, start protonix Gtt. He was guaiac positive. Will continue to cycle troponin. Need GI evaluation.   2-GI  bleed; melena; IV protonix Gtt. Last Hb per records was at 11 (01-2013). Need GI evaluation.   3-Acute blood lass Anemia: probably related to GI bleed.   4-CAD; hold aspirin and effient due to concern for GI bleed.   5-Thrombocytopenia; related to increase consumption vs medication.  6-Diabetes; SSI.  7-HTN; Hold cozaar, will continue with lasix to avoid volume overload.  Code Status: full code.  Family Communication: care discussed with patient.  Disposition Plan: expect 3 to 4 days inpatient.   Time spent: 75 minutes.   Niel Hummer A [facility] Pager [phone]  **Disclaimer: This note may have been dictated with voice recognition software. Similar sounding words can inadvertently be transcribed and this note may contain transcription errors which may not have been corrected upon publication of note.**

## 2014-02-17 NOTE — Transfer of Care (Signed)
Immediate Anesthesia Transfer of Care Note  Patient: [name]  Procedure(s) Performed: Procedure(s): ESOPHAGOGASTRODUODENOSCOPY (EGD) (N/A)  Patient Location: PACU and Endoscopy Unit  Anesthesia Type:MAC  Level of Consciousness: awake and alert   Airway & Oxygen Therapy: Patient Spontanous Breathing and Patient connected to nasal cannula oxygen  Post-op Assessment: Report given to PACU RN and Post -op Vital signs reviewed and stable  Post vital signs: Reviewed and stable  Complications: No apparent anesthesia complications

## 2014-02-17 NOTE — Progress Notes (Signed)
Utilization review completed.  

## 2014-02-17 NOTE — Progress Notes (Signed)
Pt arrived to floor via stretcher. Pt was oriented to room & call bell system. VS have been taken. Pt in no apparent distress at this time. CCMD has been notified, Pt has been placed on tele. Pt's blood sugar has been taken and Pt's family have been notified of pt's arrival to floor. Will continue to monitor pt.

## 2014-02-17 NOTE — Progress Notes (Signed)
Still waiting for pt's SCDs to arrive from portable

## 2014-02-18 ENCOUNTER — Encounter (HOSPITAL_COMMUNITY): Payer: Self-pay | Admitting: Gastroenterology

## 2014-02-18 LAB — COMPREHENSIVE METABOLIC PANEL
ALT: 12 U/L (ref 0–53)
AST: 26 U/L (ref 0–37)
Albumin: 3.2 g/dL — ABNORMAL LOW (ref 3.5–5.2)
Alkaline Phosphatase: 81 U/L (ref 39–117)
BUN: 11 mg/dL (ref 6–23)
CO2: 25 meq/L (ref 19–32)
Calcium: 8.7 mg/dL (ref 8.4–10.5)
Chloride: 102 mEq/L (ref 96–112)
Creatinine, Ser: 0.83 mg/dL (ref 0.50–1.35)
GFR, EST NON AFRICAN AMERICAN: 89 mL/min — AB (ref >90)
Glucose, Bld: 96 mg/dL (ref 70–99)
Potassium: 5 mEq/L (ref 3.7–5.3)
SODIUM: 139 meq/L (ref 137–147)
Total Bilirubin: 0.5 mg/dL (ref 0.3–1.2)
Total Protein: 6.2 g/dL (ref 6.0–8.3)

## 2014-02-18 LAB — CBC
HCT: 27.4 % — ABNORMAL LOW (ref 39.0–52.0)
HEMOGLOBIN: 8.5 g/dL — AB (ref 13.0–17.0)
MCH: 28 pg (ref 26.0–34.0)
MCHC: 31 g/dL (ref 30.0–36.0)
MCV: 90.1 fL (ref 78.0–100.0)
Platelets: 116 10*3/uL — ABNORMAL LOW (ref 150–400)
RBC: 3.04 MIL/uL — ABNORMAL LOW (ref 4.22–5.81)
RDW: 13.9 % (ref 11.5–15.5)
WBC: 4 10*3/uL (ref 4.0–10.5)

## 2014-02-18 LAB — TYPE AND SCREEN
ABO/RH(D): O POS
Antibody Screen: NEGATIVE
Unit division: 0

## 2014-02-18 LAB — GLUCOSE, CAPILLARY
GLUCOSE-CAPILLARY: 116 mg/dL — AB (ref 70–99)
Glucose-Capillary: 106 mg/dL — ABNORMAL HIGH (ref 70–99)

## 2014-02-18 MED ORDER — ASPIRIN 81 MG PO CHEW: 81.0000 mg | CHEWABLE_TABLET | Freq: Every day | ORAL | Status: DC

## 2014-02-18 MED ORDER — ESOMEPRAZOLE MAGNESIUM 40 MG PO CPDR: 40.0000 mg | DELAYED_RELEASE_CAPSULE | Freq: Every day | ORAL | Status: DC

## 2014-02-18 MED ORDER — LOSARTAN POTASSIUM 25 MG PO TABS: 25.0000 mg | ORAL_TABLET | Freq: Every day | ORAL | Status: DC

## 2014-02-18 NOTE — Anesthesia Postprocedure Evaluation (Signed)
  Anesthesia Post-op Note  Patient: [name]  Procedure(s) Performed: Procedure(s): ESOPHAGOGASTRODUODENOSCOPY (EGD) (N/A)  Patient Location: PACU  Anesthesia Type: MAC  Level of Consciousness: awake and alert   Airway and Oxygen Therapy: Patient Spontanous Breathing  Post-op Pain: none  Post-op Assessment: Post-op Vital signs reviewed, Patient's Cardiovascular Status Stable and Respiratory Function Stable  Post-op Vital Signs: Reviewed  Filed Vitals:   02/18/14 0517  BP: 115/72  Pulse: 67  Temp: 36.7 C  Resp: 18    Complications: No apparent anesthesia complications

## 2014-02-18 NOTE — Discharge Summary (Signed)
PATIENT DETAILS Name: [name] Age: 68 y.o. Sex: male Date of Birth: December 12, 1945 MRN: [id number]. Admit Date: 02/16/2014 Admitting Physician: Elmarie Shiley, MD DXA:[id number] Asa Lente, MD  Recommendations for Outpatient Follow-up:  1. Recheck CBC and BMET in am 2. Will need outpatient work up for newly diagnosed Liver Cirrhoses  PRIMARY DISCHARGE DIAGNOSIS:  Active Problems:   DIABETES MELLITUS, TYPE II-controlled   HYPERTENSION   GI bleed   Acute Blood Loss anemia      PAST MEDICAL HISTORY: Past Medical History  Diagnosis Date  . Unspecified essential hypertension   . Coronary artery disease CARDIOLOGIST-  DR Cathie Olden    PTCA/DES to LAD 06/28/12  . S/P drug eluting coronary stent placement     06-28-2012  X1  DES  PROXIMAL LAD  . Type 2 diabetes mellitus   . GERD (gastroesophageal reflux disease)   . Phimosis   . History of kidney stones   . Hyperlipidemia   . History of concussion     1976--  NO RESIDUAL  . Iron deficiency anemia   . History of GI bleed     UPPER GASTRIC ULCER 2 YRS AGO-- NO ISSUES  . History of gout     2007 &  2008  LEFT LEG-- NO ISSUE SINCE  . Chronic low back pain   . Arthritis   . OA (osteoarthritis of spine)     LOWER BACK--  INTERMITTANT LEFT LEG NUMBNESS  . OSA (obstructive sleep apnea)     PULMOLOGIST-  DR CLANCE--  MODERATE OSA  STARTED CPAP 2012--  BUT CURRENTLY HAS NOT USED PAST 6 MONTHS    DISCHARGE MEDICATIONS:   Medication List    STOP taking these medications       prasugrel 5 MG Tabs tablet  Commonly known as:  EFFIENT      TAKE these medications       aspirin 81 MG chewable tablet  Chew 1 tablet (81 mg total) by mouth daily.  Start taking on:  02/25/2014     esomeprazole 40 MG capsule  Commonly known as:  NEXIUM  Take 1 capsule (40 mg total) by mouth daily at 12 noon.     ferrous sulfate 325 (65 FE) MG tablet  Take 325 mg by mouth daily.     furosemide 20 MG tablet  Commonly known as:  LASIX  Take 20 mg  by mouth every morning.     losartan 25 MG tablet  Commonly known as:  COZAAR  Take 1 tablet (25 mg total) by mouth daily.     metFORMIN 500 MG tablet  Commonly known as:  GLUCOPHAGE  Take 500 mg by mouth 2 (two) times daily with a meal.     nitroGLYCERIN 0.4 MG SL tablet  Commonly known as:  NITROSTAT  Place 1 tablet (0.4 mg total) under the tongue every 5 (five) minutes as needed for chest pain.        ALLERGIES:  No Known Allergies  BRIEF HPI:  See H&P, Labs, Consult and Test reports for all details in brief, [name] is a 68 y.o. male with PMH significant for CAD PTCA/DES to LAD 2013, Diabetes, HTN, history of gastric Ulcer, who presents to ED complaining of epigastric pain that started 1 week ago, he describe pain as [place], worse on exertion and meals, feels like gas. Feels similar to prior pain requiring cardiac stent. He relates black stool, on and off   CONSULTATIONS:   GI  PERTINENT  RADIOLOGIC STUDIES: Ct Abdomen Pelvis W Contrast  02/17/2014   CLINICAL DATA:  Diabetes .  Evaluate for liver disease.  GI bleed.  EXAM: CT ABDOMEN AND PELVIS WITH CONTRAST  TECHNIQUE: Multidetector CT imaging of the abdomen and pelvis was performed using the standard protocol following bolus administration of intravenous contrast.  CONTRAST:  129mL OMNIPAQUE IOHEXOL 300 MG/ML  SOLN  COMPARISON:  CT 11/22/2012  FINDINGS: Lung bases are clear.  No pericardial fluid.  Liver has a fine nodular contour. The caudate lobe is enlarged as well as the left hepatic lobe being slight shrunken. Portal veins are patent. The splenic vein is patent. Spleen is slightly enlarged at 13 cm craniocaudad is unchanged from prior. The multiple calcified granulomata within the spleen. Low-density lesion in the superior aspect of the spleen likely represent benign cysts or hemangioma and is unchanged prior. There are several venous collaterals in the gastrohepatic ligament similar prior.  The gallbladder, pancreas,  adrenal glands, and left kidney are normal. There is a nonobstructing calculus in the mid right kidney measuring 13 mm  The stomach, small bowel, and cecum are normal. The colon and rectosigmoid colon are normal.  Abdominal aorta is normal caliber. No retroperitoneal periportal lymphadenopathy. No free fluid the pelvis. The bladder and prostate gland are normal. No pelvic lymphadenopathy. No aggressive osseous lesion.  IMPRESSION: 1. Morphologic changes within the liver consistent with cirrhosis. 2. Evidence mild / early portal hypertension with mild splenomegaly and mild perigastric venous collaterals. No ascites. 3. No enhancing lesion within the liver. 4. Nonobstructing right renal calculus.   Electronically Signed   By: Suzy Bouchard M.D.   On: 02/17/2014 15:38     PERTINENT LAB RESULTS: CBC:  Recent Labs  02/17/14 0358 02/18/14 0345  WBC 3.3* 4.0  HGB 7.7* 8.5*  HCT 25.4* 27.4*  PLT 119* 116*   CMET CMP     Component Value Date/Time   NA 139 02/18/2014 0345   K 5.0 02/18/2014 0345   CL 102 02/18/2014 0345   CO2 25 02/18/2014 0345   GLUCOSE 96 02/18/2014 0345   BUN 11 02/18/2014 0345   CREATININE 0.83 02/18/2014 0345   CALCIUM 8.7 02/18/2014 0345   PROT 6.2 02/18/2014 0345   ALBUMIN 3.2* 02/18/2014 0345   AST 26 02/18/2014 0345   ALT 12 02/18/2014 0345   ALKPHOS 81 02/18/2014 0345   BILITOT 0.5 02/18/2014 0345   GFRNONAA 89* 02/18/2014 0345   GFRAA >90 02/18/2014 0345    GFR Estimated Creatinine Clearance: 107.1 ml/min (by C-G formula based on Cr of 0.83).  Recent Labs  02/16/14 2312  LIPASE 37    Recent Labs  02/17/14 0358 02/17/14 0834 02/17/14 1440  TROPONINI <0.30 <0.30 <0.30   No components found with this basename: POCBNP,  No results found for this basename: DDIMER,  in the last 72 hours No results found for this basename: HGBA1C,  in the last 72 hours No results found for this basename: CHOL, HDL, LDLCALC, TRIG, CHOLHDL, LDLDIRECT,  in the last 72 hours No  results found for this basename: TSH, T4TOTAL, FREET3, T3FREE, THYROIDAB,  in the last 72 hours No results found for this basename: VITAMINB12, FOLATE, FERRITIN, TIBC, IRON, RETICCTPCT,  in the last 72 hours Coags:  Recent Labs  02/16/14 2312  INR 1.11   Microbiology: No results found for this or any previous visit (from the past 240 hour(s)).   BRIEF HOSPITAL COURSE:  UGI bleed - Likely secondary from portal gastropathy - Admitted,  GI consulted, underwent EGD on 6/22 which showed portal gastropathy. - Required 1 unit of PRBC during this hospital stay, hemoglobin posttransfusion stable at 8.5. - Will be discharged on a PPI, will need further workup to be done in the outpatient setting for newly diagnosed cirrhosis by his gastroenterologist. - Note, Effient needs to be permanently discontinued-I have discussed the patient and the son as well, okay to resume aspirin in 1 week.  Acute blood loss anemia - Admitted, hemoglobin on admission was 8.0, slowly decreased to 7.7, subsequently transfused one unit of PRBC. Hemoglobin at discharge 8.5. No further overt evidence of GI bleed.  Newly diagnosed liver cirrhosis - Etiology not apparent at this time, no long-standing history of alcohol use, denies any history of hepatitis C. - At this time, further workup deferred to the outpatient setting. Long discussion with patient and his son at bedside. They know,they have to get an appointment with his primary gastroenterologist, Dr. Penelope Coop in the next few weeks.  Hypertension - We'll resume Cozaar, but decrease dose. Continue Lasix. - Further optimization to be done in the outpatient setting.  Diabetes - Resume metformin on discharge. - Further optimization to be done in the outpatient setting.  CAD - Prior to this admission, was on aspirin and effient-had seen his primary cardiologist Dr. Cathie Olden late last year and was told to come off Effient. Apparently he was still taking Effient prior to  this admission. At this time, recommendations are to stop aspirin for one week before resuming, and to permanently discontinue FE and. This was discussed with primary cardiologist-Dr.. Nasher.   TODAY-DAY OF DISCHARGE:  Subjective:   [name] today has no headache,no chest abdominal pain,no new weakness tingling or numbness, feels much better wants to go home today.   Objective:   Blood pressure 115/72, pulse 67, temperature 98.1 F (36.7 C), temperature source Oral, resp. rate 18, height 5\' 8"  (1.727 m), weight 116.711 kg (257 lb 4.8 oz), SpO2 95.00%.  Intake/Output Summary (Last 24 hours) at 02/18/14 0907 Last data filed at 02/18/14 0517  Gross per 24 hour  Intake 650.83 ml  Output      0 ml  Net 650.83 ml   Filed Weights   02/16/14 2302 02/17/14 0317 02/18/14 0517  Weight: 108.863 kg (240 lb) 116.5 kg (256 lb 13.4 oz) 116.711 kg (257 lb 4.8 oz)    Exam Awake Alert, Oriented *3, No new F.N deficits, Normal affect [place].AT,PERRAL Supple Neck,No JVD, No cervical lymphadenopathy appriciated.  Symmetrical Chest wall movement, Good air movement bilaterally, CTAB RRR,No Gallops,Rubs or new Murmurs, No Parasternal Heave +ve B.Sounds, Abd Soft, Non tender, No organomegaly appriciated, No rebound -guarding or rigidity. No Cyanosis, Clubbing or edema, No new Rash or bruise  DISCHARGE CONDITION: Stable  DISPOSITION: Home  DISCHARGE INSTRUCTIONS:    Activity:  As tolerated   Diet recommendation: Diabetic Diet Heart Healthy diet   Discharge Instructions   Diet - low sodium heart healthy    Complete by:  As directed      Increase activity slowly    Complete by:  As directed            Follow-up Information   Follow up with Gwendolyn Grant, MD. Schedule an appointment as soon as possible for a visit in 1 week.   Specialty:  Internal Medicine   Contact information:   [address]  [place] [address] [phone]       Follow up  with Wonda Horner, MD. Schedule an appointment as soon as possible for a visit in 2 weeks.   Specialty:  Gastroenterology   Contact information:   [address], [place] [address] [phone]       Total Time spent on discharge equals 45 minutes.  SignedOren Binet 02/18/2014 9:07 AM  **Disclaimer: This note may have been dictated with voice recognition software. Similar sounding words can inadvertently be transcribed and this note may contain transcription errors which may not have been corrected upon publication of note.**

## 2014-02-18 NOTE — Progress Notes (Signed)
[name] to be D/C'd Home per MD order.  Discussed with the patient and son and all questions fully answered.    Medication List    STOP taking these medications       prasugrel 5 MG Tabs tablet  Commonly known as:  EFFIENT      TAKE these medications       aspirin 81 MG chewable tablet  Chew 1 tablet (81 mg total) by mouth daily.  Start taking on:  02/25/2014     esomeprazole 40 MG capsule  Commonly known as:  NEXIUM  Take 1 capsule (40 mg total) by mouth daily at 12 noon.     ferrous sulfate 325 (65 FE) MG tablet  Take 325 mg by mouth daily.     furosemide 20 MG tablet  Commonly known as:  LASIX  Take 20 mg by mouth every morning.     losartan 25 MG tablet  Commonly known as:  COZAAR  Take 1 tablet (25 mg total) by mouth daily.     metFORMIN 500 MG tablet  Commonly known as:  GLUCOPHAGE  Take 500 mg by mouth 2 (two) times daily with a meal.     nitroGLYCERIN 0.4 MG SL tablet  Commonly known as:  NITROSTAT  Place 1 tablet (0.4 mg total) under the tongue every 5 (five) minutes as needed for chest pain.        VVS, Skin clean, dry and intact without evidence of skin break down, no evidence of skin tears noted. IV catheter discontinued intact. Site without signs and symptoms of complications. Dressing and pressure applied.  An After Visit Summary was printed and given to the patient.  D/c education completed with patient/family including follow up instructions, medication list, d/c activities limitations if indicated, with other d/c instructions as indicated by MD - patient able to verbalize understanding, all questions fully answered.   Patient instructed to return to ED, call 911, or call MD for any changes in condition.   Patient escorted via [place], and D/C home via private auto.  Wonda Cerise D 02/18/2014 9:28 AM

## 2014-02-18 NOTE — Care Management Note (Signed)
    Page 1 of 1   02/18/2014     11:04:23 AM CARE MANAGEMENT NOTE 02/18/2014  Patient:  [name], [name]   Account Number:  [id number]  Date Initiated:  02/18/2014  Documentation initiated by:  Tomi Bamberger  Subjective/Objective Assessment:   dx chest pain, gib  admit- from home     Action/Plan:   Anticipated DC Date:  02/18/2014   Anticipated DC Plan:  [place]  CM consult      Choice offered to / List presented to:             Status of service:  Completed, signed off Medicare Important Message given?  NA - LOS <3 / Initial given by admissions (If response is "NO", the following Medicare IM given date fields will be blank) Date Medicare IM given:   Date Additional Medicare IM given:    Discharge Disposition:  HOME/SELF CARE  Per UR Regulation:  Reviewed for med. necessity/level of care/duration of stay  If discussed at [place] of Stay Meetings, dates discussed:    Comments:

## 2014-02-18 NOTE — Progress Notes (Signed)
[name] 10:03 AM  Subjective: Patient without any post endoscopic problem and has no complaints and is eating fine  Objective: Vital signs stable afebrile no acute distress abdomen is soft nontender hemoglobin stable CT reviewed  Assessment: Subacute GI bleeding probably from portal gastropathy from his liver disease  Plan: We discussed weight loss and no alcohol and might consider a beta blocker at some point and would continue pump inhibitor and my partners happy to see back in a few weeks to recheck CBC and make sure no other workup or plans are needed and if signs of lower GI bleeding occur he might need repeat colonoscopy and cauterizing his AVMs were seen in the cecum although portal gastropathy is probably more likely etiology  MAGOD,MARC E

## 2014-02-25 ENCOUNTER — Other Ambulatory Visit: Payer: Self-pay | Admitting: Cardiovascular Disease

## 2014-02-27 ENCOUNTER — Other Ambulatory Visit: Payer: Self-pay

## 2014-02-27 ENCOUNTER — Ambulatory Visit (INDEPENDENT_AMBULATORY_CARE_PROVIDER_SITE_OTHER): Payer: Medicare Other | Admitting: Internal Medicine

## 2014-02-27 ENCOUNTER — Telehealth: Payer: Self-pay

## 2014-02-27 ENCOUNTER — Other Ambulatory Visit (INDEPENDENT_AMBULATORY_CARE_PROVIDER_SITE_OTHER): Payer: Medicare Other

## 2014-02-27 ENCOUNTER — Encounter: Payer: Self-pay | Admitting: Internal Medicine

## 2014-02-27 VITALS — BP 126/64 | HR 62 | Temp 97.8°F | Ht 68.0 in | Wt 256.1 lb

## 2014-02-27 DIAGNOSIS — E119 Type 2 diabetes mellitus without complications: Secondary | ICD-10-CM

## 2014-02-27 DIAGNOSIS — K2951 Unspecified chronic gastritis with bleeding: Secondary | ICD-10-CM

## 2014-02-27 DIAGNOSIS — K2941 Chronic atrophic gastritis with bleeding: Secondary | ICD-10-CM

## 2014-02-27 DIAGNOSIS — K7460 Unspecified cirrhosis of liver: Secondary | ICD-10-CM

## 2014-02-27 DIAGNOSIS — I2510 Atherosclerotic heart disease of native coronary artery without angina pectoris: Secondary | ICD-10-CM

## 2014-02-27 LAB — CBC WITH DIFFERENTIAL/PLATELET
BASOS ABS: 0 10*3/uL (ref 0.0–0.1)
BASOS PCT: 0.3 % (ref 0.0–3.0)
EOS PCT: 2.3 % (ref 0.0–5.0)
Eosinophils Absolute: 0.1 10*3/uL (ref 0.0–0.7)
HEMATOCRIT: 30.6 % — AB (ref 39.0–52.0)
HEMOGLOBIN: 9.9 g/dL — AB (ref 13.0–17.0)
LYMPHS PCT: 17.7 % (ref 12.0–46.0)
Lymphs Abs: 0.8 10*3/uL (ref 0.7–4.0)
MCHC: 32.3 g/dL (ref 30.0–36.0)
MCV: 85.1 fl (ref 78.0–100.0)
MONOS PCT: 7.5 % (ref 3.0–12.0)
Monocytes Absolute: 0.3 10*3/uL (ref 0.1–1.0)
NEUTROS ABS: 3.4 10*3/uL (ref 1.4–7.7)
Neutrophils Relative %: 72.2 % (ref 43.0–77.0)
Platelets: 137 10*3/uL — ABNORMAL LOW (ref 150.0–400.0)
RBC: 3.59 Mil/uL — AB (ref 4.22–5.81)
RDW: 15.4 % (ref 11.5–15.5)
WBC: 4.6 10*3/uL (ref 4.0–10.5)

## 2014-02-27 LAB — BASIC METABOLIC PANEL
BUN: 12 mg/dL (ref 6–23)
CO2: 28 meq/L (ref 19–32)
CREATININE: 0.7 mg/dL (ref 0.4–1.5)
Calcium: 8.8 mg/dL (ref 8.4–10.5)
Chloride: 106 mEq/L (ref 96–112)
GFR: 117.27 mL/min (ref >60.00)
Glucose, Bld: 161 mg/dL — ABNORMAL HIGH (ref 70–99)
Potassium: 4 mEq/L (ref 3.5–5.1)
Sodium: 138 mEq/L (ref 135–145)

## 2014-02-27 LAB — HEPATIC FUNCTION PANEL
ALK PHOS: 98 U/L (ref 39–117)
ALT: 19 U/L (ref 0–53)
AST: 25 U/L (ref 0–37)
Albumin: 3.7 g/dL (ref 3.5–5.2)
Bilirubin, Direct: 0.1 mg/dL (ref 0.0–0.3)
TOTAL PROTEIN: 6.9 g/dL (ref 6.0–8.3)
Total Bilirubin: 0.5 mg/dL (ref 0.2–1.2)

## 2014-02-27 LAB — HEMOGLOBIN A1C: Hgb A1c MFr Bld: 5.6 % (ref 4.6–6.5)

## 2014-02-27 MED ORDER — FUROSEMIDE 20 MG PO TABS: 20.0000 mg | ORAL_TABLET | Freq: Two times a day (BID) | ORAL | Status: DC

## 2014-02-27 MED ORDER — NITROGLYCERIN 0.4 MG SL SUBL: 0.4000 mg | SUBLINGUAL_TABLET | SUBLINGUAL | Status: DC | PRN

## 2014-02-27 NOTE — Progress Notes (Signed)
Pre visit review using our clinic review tool, if applicable. No additional management support is needed unless otherwise documented below in the visit note. 

## 2014-02-27 NOTE — Telephone Encounter (Signed)
Pt return call back gave md response...[name]

## 2014-02-27 NOTE — Assessment & Plan Note (Signed)
PTCA/DES to LAD 06/28/12 - on Effient and ASA 81 -  briefly held both meds due to GIB 07/2012 x 48h Again held due to GI bleed June 2015, has resumed aspirin 81 mg since discharge but no Effient No angina continue statin and ARB Follows regularly with cards for same -

## 2014-02-27 NOTE — Patient Instructions (Signed)
It was good to see you today.  We have reviewed your hospital records including labs and tests today  Test(s) ordered today. Your results will be released to [facility] [place] (or called to you) after review, usually within 72hours after test completion. If any changes need to be made, you will be notified at that same time.  Medications reviewed and updated Increase furosemide (fluid pill) to 20mg  2x/day - no other changes recommended at this time. Your prescription(s) have been submitted to your pharmacy. Please take as directed and contact our office if you believe you are having problem(s) with the medication(s).  follow up with Dr Penelope Coop (GI doc) for your liver as planned  Please schedule followup in 3-4 months, call sooner if problems.

## 2014-02-27 NOTE — Telephone Encounter (Signed)
Message copied by Lowella Dandy on Thu Feb 27, 2014  2:08 PM ------      Message from: Gwendolyn Grant A      Created: Thu Feb 27, 2014  1:48 PM       Please call patient:      I have reviewed all lab results which are normal or stable. No treatment changes recommended. Let me know if there are questions or problems. Thanks ------

## 2014-02-27 NOTE — Assessment & Plan Note (Signed)
On metformin for same since 07/2010- also ARB and statin Check a1c q53mo, titrate as needed The patient is asked to make an attempt to improve diet and exercise patterns to aid in medical management of this problem.  Lab Results  Component Value Date   HGBA1C 5.4 09/11/2013

## 2014-02-27 NOTE — Telephone Encounter (Signed)
Left message with Richmond Campbell for Alfredo to return our call.

## 2014-02-27 NOTE — Assessment & Plan Note (Signed)
Reviewed hospitalization June 2015 for same, EGD unremarkable for active bleeding, gastritis and new diagnosis cirrhosis, see below 1 unit transfusion PRBC Recheck CBC now Has resumed aspirin for CAD history Followup GI as planned

## 2014-02-27 NOTE — Progress Notes (Signed)
Subjective:    Patient ID: [name], male    DOB: 1946/06/06, 68 y.o.   MRN: [id number]  HPI  Patient here for hospital follow up Reviewed chronic medical issues and interval medical events  Past Medical History  Diagnosis Date  . Unspecified essential hypertension   . Coronary artery disease CARDIOLOGIST-  DR Cathie Olden    PTCA/DES to LAD 06/28/12  . S/P drug eluting coronary stent placement     06-28-2012  X1  DES  PROXIMAL LAD  . Type 2 diabetes mellitus   . GERD (gastroesophageal reflux disease)   . Phimosis   . History of kidney stones   . Hyperlipidemia   . History of concussion     1976--  NO RESIDUAL  . Iron deficiency anemia   . History of GI bleed     UPPER GASTRIC ULCER 2 YRS AGO-- NO ISSUES  . History of gout     2007 &  2008  LEFT LEG-- NO ISSUE SINCE  . Chronic low back pain   . Arthritis   . OA (osteoarthritis of spine)     LOWER BACK--  INTERMITTANT LEFT LEG NUMBNESS  . OSA (obstructive sleep apnea)     PULMOLOGIST-  DR CLANCE--  MODERATE OSA  STARTED CPAP 2012--  BUT CURRENTLY HAS NOT USED PAST 6 MONTHS    Review of Systems  Constitutional: Negative for fatigue and unexpected weight change.  Respiratory: Negative for cough and shortness of breath.   Cardiovascular: Positive for leg swelling. Negative for chest pain.       Objective:   Physical Exam  BP 126/64  Pulse 62  Temp(Src) 97.8 F (36.6 C) (Oral)  Ht 5\' 8"  (1.727 m)  Wt 256 lb 2 oz (116.178 kg)  BMI 38.95 kg/m2  SpO2 97% Wt Readings from Last 3 Encounters:  02/27/14 256 lb 2 oz (116.178 kg)  02/18/14 257 lb 4.8 oz (116.711 kg)  02/18/14 257 lb 4.8 oz (116.711 kg)    Constitutional: he is obese, but appears well-developed and well-nourished. No distress. son and g-dtr at side Neck: Normal range of motion. Neck supple. No JVD present. No thyromegaly present.  Cardiovascular: Normal rate, regular rhythm and normal heart sounds.  No murmur heard. trace pitting BLE edema, mid calf  down. Abd: protuberant, obese, NT +BS Pulmonary/Chest: Effort normal and breath sounds normal. No respiratory distress. he has no wheezes.  Psychiatric: he has a normal mood and affect. His behavior is normal. Judgment and thought content normal.   Lab Results  Component Value Date   WBC 4.0 02/18/2014   HGB 8.5* 02/18/2014   HCT 27.4* 02/18/2014   PLT 116* 02/18/2014   GLUCOSE 96 02/18/2014   CHOL 94 07/16/2013   TRIG 75.0 07/16/2013   HDL 36.10* 07/16/2013   LDLCALC 43 07/16/2013   ALT 12 02/18/2014   AST 26 02/18/2014   NA 139 02/18/2014   K 5.0 02/18/2014   CL 102 02/18/2014   CREATININE 0.83 02/18/2014   BUN 11 02/18/2014   CO2 25 02/18/2014   TSH 1.28 05/28/2012   INR 1.11 02/16/2014   HGBA1C 5.4 09/11/2013   MICROALBUR 0.8 03/13/2013    Ct Abdomen Pelvis W Contrast  02/17/2014   CLINICAL DATA:  Diabetes .  Evaluate for liver disease.  GI bleed.  EXAM: CT ABDOMEN AND PELVIS WITH CONTRAST  TECHNIQUE: Multidetector CT imaging of the abdomen and pelvis was performed using the standard protocol following bolus administration of intravenous contrast.  CONTRAST:  113mL OMNIPAQUE IOHEXOL 300 MG/ML  SOLN  COMPARISON:  CT 11/22/2012  FINDINGS: Lung bases are clear.  No pericardial fluid.  Liver has a fine nodular contour. The caudate lobe is enlarged as well as the left hepatic lobe being slight shrunken. Portal veins are patent. The splenic vein is patent. Spleen is slightly enlarged at 13 cm craniocaudad is unchanged from prior. The multiple calcified granulomata within the spleen. Low-density lesion in the superior aspect of the spleen likely represent benign cysts or hemangioma and is unchanged prior. There are several venous collaterals in the gastrohepatic ligament similar prior.  The gallbladder, pancreas, adrenal glands, and left kidney are normal. There is a nonobstructing calculus in the mid right kidney measuring 13 mm  The stomach, small bowel, and cecum are normal. The colon and rectosigmoid  colon are normal.  Abdominal aorta is normal caliber. No retroperitoneal periportal lymphadenopathy. No free fluid the pelvis. The bladder and prostate gland are normal. No pelvic lymphadenopathy. No aggressive osseous lesion.  IMPRESSION: 1. Morphologic changes within the liver consistent with cirrhosis. 2. Evidence mild / early portal hypertension with mild splenomegaly and mild perigastric venous collaterals. No ascites. 3. No enhancing lesion within the liver. 4. Nonobstructing right renal calculus.   Electronically Signed   By: Suzy Bouchard M.D.   On: 02/17/2014 15:38       Assessment & Plan:   Problem List Items Addressed This Visit   Cirrhosis of liver without mention of alcohol     New diagnosis June 2015 in setting of recurrent GI bleed and progressive changes on CT abdomen Progressive bilateral lower stormy edema, but no ascites on CT, mild portal hypertensive changes, but no varices on EGD June 22 Increase Lasix now Check electrolytes, creatinine and LFTs Followup GI as planned for further workup of etiology    Relevant Orders      Hepatic function panel      Basic metabolic panel   Coronary artery disease s/p stent LAD 10/13     PTCA/DES to LAD 06/28/12 - on Effient and ASA 81 -  briefly held both meds due to GIB 07/2012 x 48h Again held due to GI bleed June 2015, has resumed aspirin 81 mg since discharge but no Effient No angina continue statin and ARB Follows regularly with cards for same -     Relevant Medications      furosemide (LASIX) tablet   Other Relevant Orders      Basic metabolic panel   DIABETES MELLITUS, TYPE II-controlled      On metformin for same since 07/2010- also ARB and statin Check a1c q30mo, titrate as needed The patient is asked to make an attempt to improve diet and exercise patterns to aid in medical management of this problem.  Lab Results  Component Value Date   HGBA1C 5.4 09/11/2013      Relevant Orders      Basic metabolic panel       Hemoglobin A1c   GI bleed - Primary     Reviewed hospitalization June 2015 for same, EGD unremarkable for active bleeding, gastritis and new diagnosis cirrhosis, see below 1 unit transfusion PRBC Recheck CBC now Has resumed aspirin for CAD history Followup GI as planned    Relevant Orders      CBC with Differential      Hepatic function panel      Basic metabolic panel

## 2014-02-27 NOTE — Assessment & Plan Note (Signed)
New diagnosis June 2015 in setting of recurrent GI bleed and progressive changes on CT abdomen Progressive bilateral lower stormy edema, but no ascites on CT, mild portal hypertensive changes, but no varices on EGD June 22 Increase Lasix now Check electrolytes, creatinine and LFTs Followup GI as planned for further workup of etiology

## 2014-03-27 ENCOUNTER — Encounter (HOSPITAL_COMMUNITY): Payer: Self-pay | Admitting: Emergency Medicine

## 2014-03-27 ENCOUNTER — Emergency Department (HOSPITAL_COMMUNITY): Payer: Medicare Other

## 2014-03-27 ENCOUNTER — Inpatient Hospital Stay (HOSPITAL_COMMUNITY)
Admission: EM | Admit: 2014-03-27 | Discharge: 2014-04-05 | DRG: 234 | Disposition: A | Discharge status: Home / Self Care | Payer: Medicare Other | Attending: Thoracic Surgery (Cardiothoracic Vascular Surgery) | Admitting: Thoracic Surgery (Cardiothoracic Vascular Surgery)

## 2014-03-27 DIAGNOSIS — I079 Rheumatic tricuspid valve disease, unspecified: Secondary | ICD-10-CM | POA: Diagnosis present

## 2014-03-27 DIAGNOSIS — D696 Thrombocytopenia, unspecified: Secondary | ICD-10-CM | POA: Diagnosis not present

## 2014-03-27 DIAGNOSIS — D62 Acute posthemorrhagic anemia: Secondary | ICD-10-CM | POA: Diagnosis not present

## 2014-03-27 DIAGNOSIS — E669 Obesity, unspecified: Secondary | ICD-10-CM | POA: Diagnosis present

## 2014-03-27 DIAGNOSIS — Z951 Presence of aortocoronary bypass graft: Secondary | ICD-10-CM

## 2014-03-27 DIAGNOSIS — K2270 Barrett's esophagus without dysplasia: Secondary | ICD-10-CM | POA: Diagnosis present

## 2014-03-27 DIAGNOSIS — I200 Unstable angina: Secondary | ICD-10-CM | POA: Diagnosis present

## 2014-03-27 DIAGNOSIS — G4733 Obstructive sleep apnea (adult) (pediatric): Secondary | ICD-10-CM | POA: Diagnosis present

## 2014-03-27 DIAGNOSIS — G8929 Other chronic pain: Secondary | ICD-10-CM | POA: Diagnosis present

## 2014-03-27 DIAGNOSIS — Z6836 Body mass index (BMI) 36.0-36.9, adult: Secondary | ICD-10-CM

## 2014-03-27 DIAGNOSIS — Z87891 Personal history of nicotine dependence: Secondary | ICD-10-CM | POA: Diagnosis not present

## 2014-03-27 DIAGNOSIS — I080 Rheumatic disorders of both mitral and aortic valves: Secondary | ICD-10-CM | POA: Diagnosis present

## 2014-03-27 DIAGNOSIS — I129 Hypertensive chronic kidney disease with stage 1 through stage 4 chronic kidney disease, or unspecified chronic kidney disease: Secondary | ICD-10-CM | POA: Diagnosis present

## 2014-03-27 DIAGNOSIS — K7460 Unspecified cirrhosis of liver: Secondary | ICD-10-CM | POA: Diagnosis present

## 2014-03-27 DIAGNOSIS — K5900 Constipation, unspecified: Secondary | ICD-10-CM | POA: Diagnosis not present

## 2014-03-27 DIAGNOSIS — K319 Disease of stomach and duodenum, unspecified: Secondary | ICD-10-CM | POA: Diagnosis present

## 2014-03-27 DIAGNOSIS — E119 Type 2 diabetes mellitus without complications: Secondary | ICD-10-CM | POA: Diagnosis present

## 2014-03-27 DIAGNOSIS — Z7982 Long term (current) use of aspirin: Secondary | ICD-10-CM | POA: Diagnosis not present

## 2014-03-27 DIAGNOSIS — K219 Gastro-esophageal reflux disease without esophagitis: Secondary | ICD-10-CM | POA: Diagnosis present

## 2014-03-27 DIAGNOSIS — Z8719 Personal history of other diseases of the digestive system: Secondary | ICD-10-CM

## 2014-03-27 DIAGNOSIS — I2510 Atherosclerotic heart disease of native coronary artery without angina pectoris: Secondary | ICD-10-CM

## 2014-03-27 DIAGNOSIS — M545 Low back pain, unspecified: Secondary | ICD-10-CM | POA: Diagnosis present

## 2014-03-27 DIAGNOSIS — R079 Chest pain, unspecified: Secondary | ICD-10-CM | POA: Diagnosis present

## 2014-03-27 DIAGNOSIS — E785 Hyperlipidemia, unspecified: Secondary | ICD-10-CM | POA: Diagnosis present

## 2014-03-27 DIAGNOSIS — J45909 Unspecified asthma, uncomplicated: Secondary | ICD-10-CM | POA: Diagnosis present

## 2014-03-27 DIAGNOSIS — Z801 Family history of malignant neoplasm of trachea, bronchus and lung: Secondary | ICD-10-CM

## 2014-03-27 DIAGNOSIS — I10 Essential (primary) hypertension: Secondary | ICD-10-CM | POA: Diagnosis present

## 2014-03-27 DIAGNOSIS — E8779 Other fluid overload: Secondary | ICD-10-CM | POA: Diagnosis not present

## 2014-03-27 DIAGNOSIS — J9819 Other pulmonary collapse: Secondary | ICD-10-CM | POA: Diagnosis not present

## 2014-03-27 DIAGNOSIS — Z9861 Coronary angioplasty status: Secondary | ICD-10-CM

## 2014-03-27 DIAGNOSIS — K560 Paralytic ileus: Secondary | ICD-10-CM | POA: Diagnosis not present

## 2014-03-27 DIAGNOSIS — D500 Iron deficiency anemia secondary to blood loss (chronic): Secondary | ICD-10-CM | POA: Diagnosis present

## 2014-03-27 DIAGNOSIS — I25118 Atherosclerotic heart disease of native coronary artery with other forms of angina pectoris: Secondary | ICD-10-CM

## 2014-03-27 HISTORY — DX: Personal history of other medical treatment: Z92.89

## 2014-03-27 HISTORY — DX: Low back pain: M54.5

## 2014-03-27 HISTORY — DX: Low back pain, unspecified: M54.50

## 2014-03-27 HISTORY — DX: Other chronic pain: G89.29

## 2014-03-27 HISTORY — DX: Unspecified cirrhosis of liver: K74.60

## 2014-03-27 HISTORY — DX: Unspecified asthma, uncomplicated: J45.909

## 2014-03-27 HISTORY — DX: Calculus of kidney: N20.0

## 2014-03-27 LAB — BASIC METABOLIC PANEL
ANION GAP: 13 (ref 5–15)
BUN: 16 mg/dL (ref 6–23)
CALCIUM: 8.7 mg/dL (ref 8.4–10.5)
CO2: 24 mEq/L (ref 19–32)
CREATININE: 0.68 mg/dL (ref 0.50–1.35)
Chloride: 104 mEq/L (ref 96–112)
GFR calc Af Amer: >90 mL/min (ref >90)
GFR calc non Af Amer: >90 mL/min (ref >90)
Glucose, Bld: 211 mg/dL — ABNORMAL HIGH (ref 70–99)
Potassium: 4.2 mEq/L (ref 3.7–5.3)
SODIUM: 141 meq/L (ref 137–147)

## 2014-03-27 LAB — PROTIME-INR
INR: 1.1 (ref 0.00–1.49)
Prothrombin Time: 14.2 seconds (ref 11.6–15.2)

## 2014-03-27 LAB — CBC
HCT: 29.5 % — ABNORMAL LOW (ref 39.0–52.0)
Hemoglobin: 9 g/dL — ABNORMAL LOW (ref 13.0–17.0)
MCH: 26.7 pg (ref 26.0–34.0)
MCHC: 30.5 g/dL (ref 30.0–36.0)
MCV: 87.5 fL (ref 78.0–100.0)
PLATELETS: 122 10*3/uL — AB (ref 150–400)
RBC: 3.37 MIL/uL — ABNORMAL LOW (ref 4.22–5.81)
RDW: 15.7 % — AB (ref 11.5–15.5)
WBC: 3.7 10*3/uL — AB (ref 4.0–10.5)

## 2014-03-27 LAB — GLUCOSE, CAPILLARY: GLUCOSE-CAPILLARY: 144 mg/dL — AB (ref 70–99)

## 2014-03-27 LAB — I-STAT TROPONIN, ED
Troponin i, poc: 0 ng/mL (ref 0.00–0.08)
Troponin i, poc: 0 ng/mL (ref 0.00–0.08)

## 2014-03-27 LAB — TROPONIN I: Troponin I: <0.3 ng/mL (ref <0.30)

## 2014-03-27 MED ORDER — FERROUS SULFATE 325 (65 FE) MG PO TABS
325.0000 mg | ORAL_TABLET | Freq: Two times a day (BID) | ORAL | Status: DC
  Administered 2014-03-27 – 2014-03-30 (×6): 325 mg via ORAL
  Filled 2014-03-27 (×10): qty 1

## 2014-03-27 MED ORDER — SODIUM CHLORIDE 0.9 % IV SOLN: 1000.0000 mL | INTRAVENOUS | Status: DC

## 2014-03-27 MED ORDER — ATORVASTATIN CALCIUM 80 MG PO TABS
80.0000 mg | ORAL_TABLET | Freq: Every day | ORAL | Status: DC
  Administered 2014-03-28 – 2014-03-30 (×3): 80 mg via ORAL
  Filled 2014-03-27 (×4): qty 1

## 2014-03-27 MED ORDER — SODIUM CHLORIDE 0.9 % IJ SOLN: 3.0000 mL | Freq: Two times a day (BID) | INTRAMUSCULAR | Status: DC

## 2014-03-27 MED ORDER — SODIUM CHLORIDE 0.9 % IJ SOLN
3.0000 mL | Freq: Two times a day (BID) | INTRAMUSCULAR | Status: DC
  Administered 2014-03-29 – 2014-03-30 (×3): 3 mL via INTRAVENOUS

## 2014-03-27 MED ORDER — FUROSEMIDE 20 MG PO TABS
20.0000 mg | ORAL_TABLET | Freq: Two times a day (BID) | ORAL | Status: DC
  Administered 2014-03-27 – 2014-03-30 (×6): 20 mg via ORAL
  Filled 2014-03-27 (×10): qty 1

## 2014-03-27 MED ORDER — SODIUM CHLORIDE 0.9 % IJ SOLN: 3.0000 mL | INTRAMUSCULAR | Status: DC | PRN

## 2014-03-27 MED ORDER — SODIUM CHLORIDE 0.9 % IJ SOLN
3.0000 mL | INTRAMUSCULAR | Status: DC | PRN
  Administered 2014-03-30: 3 mL via INTRAVENOUS

## 2014-03-27 MED ORDER — SODIUM CHLORIDE 0.9 % IV SOLN
INTRAVENOUS | Status: DC
  Administered 2014-03-28: 05:00:00 via INTRAVENOUS

## 2014-03-27 MED ORDER — ASPIRIN 81 MG PO CHEW
81.0000 mg | CHEWABLE_TABLET | Freq: Every day | ORAL | Status: DC
  Administered 2014-03-28 – 2014-03-30 (×3): 81 mg via ORAL
  Filled 2014-03-27 (×3): qty 1

## 2014-03-27 MED ORDER — ASPIRIN 81 MG PO CHEW
324.0000 mg | CHEWABLE_TABLET | Freq: Once | ORAL | Status: AC
Start: 2014-03-27 — End: 2014-03-27
  Administered 2014-03-27: 324 mg via ORAL
  Filled 2014-03-27: qty 4

## 2014-03-27 MED ORDER — PANTOPRAZOLE SODIUM 40 MG PO PACK
40.0000 mg | PACK | Freq: Every day | ORAL | Status: DC
  Filled 2014-03-27: qty 20

## 2014-03-27 MED ORDER — SODIUM CHLORIDE 0.9 % IV SOLN: 250.0000 mL | INTRAVENOUS | Status: DC | PRN

## 2014-03-27 MED ORDER — ACETAMINOPHEN 325 MG PO TABS: 650.0000 mg | ORAL_TABLET | ORAL | Status: DC | PRN

## 2014-03-27 MED ORDER — NITROGLYCERIN 0.4 MG SL SUBL: 0.4000 mg | SUBLINGUAL_TABLET | SUBLINGUAL | Status: DC | PRN

## 2014-03-27 MED ORDER — ONDANSETRON HCL 4 MG/2ML IJ SOLN: 4.0000 mg | Freq: Four times a day (QID) | INTRAMUSCULAR | Status: DC | PRN

## 2014-03-27 MED ORDER — INSULIN ASPART 100 UNIT/ML ~~LOC~~ SOLN
0.0000 [IU] | Freq: Three times a day (TID) | SUBCUTANEOUS | Status: DC
  Administered 2014-03-28: 3 [IU] via SUBCUTANEOUS
  Administered 2014-03-28 – 2014-03-29 (×3): 2 [IU] via SUBCUTANEOUS
  Administered 2014-03-30: 18:00:00 via SUBCUTANEOUS
  Administered 2014-03-30: 2 [IU] via SUBCUTANEOUS

## 2014-03-27 MED ORDER — LOSARTAN POTASSIUM 25 MG PO TABS
25.0000 mg | ORAL_TABLET | Freq: Every day | ORAL | Status: DC
  Administered 2014-03-28 – 2014-03-30 (×3): 25 mg via ORAL
  Filled 2014-03-27 (×5): qty 1

## 2014-03-27 NOTE — H&P (Signed)
Patient ID: [name] MRN: [id number], DOB/AGE: August 16, 1946   Admit date: 03/27/2014  Primary Physician: Gwendolyn Grant, MD Primary Cardiologist: Joaquim Nam, MD   Pt. Profile:  68 y/o male with h/o CAD s/p PCI/DES to the LAD in 05/2012 with more recent admission for GIB in 01/2014, who presented to the ED today with a 1 month h/o [place].  Problem List  Past Medical History  Diagnosis Date  . Unspecified essential hypertension   . Coronary artery disease     a. 05/2012 Cath/PCI: LM 40ost, 50-60d, LAD 99p ruptured plaque (3.0x28 DES), LCX 50p/m, RCA 30-40p, 11m, EF 65-70%.  . Type 2 diabetes mellitus   . GERD (gastroesophageal reflux disease)   . Phimosis     a. s/p circumcision 2015.  Marland Kitchen History of kidney stones   . Hyperlipidemia   . History of concussion     1976--  NO RESIDUAL  . Iron deficiency anemia   . History of GI bleed     a. UGIB 07/2012;  b. 01/2014 admission with GIB/FOB stool req 1U prbc's->EGD showed portal gastropathy, barrett's esoph, and chronic active h. pylori gastritis.  Marland Kitchen History of gout     2007 &  2008  LEFT LEG-- NO ISSUE SINCE  . Chronic low back pain   . Arthritis   . OA (osteoarthritis of spine)     LOWER BACK--  INTERMITTANT LEFT LEG NUMBNESS  . OSA (obstructive sleep apnea)     PULMOLOGIST-  DR CLANCE--  MODERATE OSA  STARTED CPAP 2012--  BUT CURRENTLY HAS NOT USED PAST 6 MONTHS  . Hepatic cirrhosis     a. Dx 01/2014 - CT a/p     Past Surgical History  Procedure Laterality Date  . Esophagogastroduodenoscopy  08/15/2012    Procedure: ESOPHAGOGASTRODUODENOSCOPY (EGD);  Surgeon: Wonda Horner, MD;  Location: [facility] ENDOSCOPY;  Service: Endoscopy;  Laterality: N/A;  . Orif right ankle fx  1989  . Laparoscopic umbilical hernia repair w/ mesh  06-06-2011  . Open appendectomy w/ partial cecectomy  05-16-2004  . Dobutamine stress echo  06-08-2012    MODERATE HYPOKINESIS/ ISCHEMIA MID INFERIOR WALL  . Coronary angioplasty with stent placement   06/28/2012  DR COOPER    PCI W/  X1 DES to [place]. LAD/  LM  40% OSTIAL & 50-60% DISTAL /  50% PROX LCX/  30-40% PROX RCA & 50% MID RCA/   LVEF 65-70%  . Nephrolithotomy  1990'S  . Circumcision N/A 09/09/2013    Procedure: CIRCUMCISION ADULT;  Surgeon: Bernestine Amass, MD;  Location: [facility];  Service: Urology;  Laterality: N/A;  . Esophagogastroduodenoscopy N/A 02/17/2014    Procedure: ESOPHAGOGASTRODUODENOSCOPY (EGD);  Surgeon: Jeryl Columbia, MD;  Location: [facility] ENDOSCOPY;  Service: Endoscopy;  Laterality: N/A;    Allergies  No Known Allergies  HPI  68 y/o male with the above complex problem list.  He is s/p PCI and DES to the LAD in 05/2012.  At that time, he had moderate non-obstructive multivessel dzs, including a 50-60% distal LM stenosis, which has been medically managed.  He was last seen in clinic by Dr. Acie Fredrickson in 06/2012 and has otherwise been followed closely by Dr. Asa Lente, his PCP.  He was recently admitted to [facility] in June with epigastric pain and dark stools.  He was found to be anemic and req 1u prbc's.  Stool was FOB +.  ASA and Effient were held and he was seen by GI.  EGD showed portal gastropathy, barrett's esophagus, and chronic active H pylori.  Once stable, he was discharged off of effient with a plan to resume asa as an outpt.  He was also dx with hepatic cirrhosis during admission with a plan for outpt GI f/u.  He says that over the past month, he has been experiencing daily exertional substernal chest pressure associated with dyspnea, similar to prior angina. Typically symptoms last approximately 5-10 minutes and resolve with rest or after he uses a sublingual nitroglycerin tablets.  Today, he had recurrent symptoms after walking to and from church associated with marked dyspnea, diaphoresis, and belching. He took several nitroglycerin tablets at home and his son drove him in to the ED. Upon arrival to the ED symptoms resolved without further intervention.  He  currently is pain-free. ECG shows no acute changes and point-of-care troponin is currently normal. Hemoglobin and hematocrit are stable compared to last check.  Home Medications  Prior to Admission medications   Medication Sig Start Date End Date Taking? Authorizing Provider  aspirin 81 MG chewable tablet Chew 1 tablet (81 mg total) by mouth daily. 02/25/14  Yes Shanker Kristeen Mans, MD  atorvastatin (LIPITOR) 80 MG tablet Take 80 mg by mouth daily.   Yes Historical Provider, MD  esomeprazole (NEXIUM) 20 MG capsule Take 20 mg by mouth 2 (two) times daily.   Yes Historical Provider, MD  ferrous sulfate 325 (65 FE) MG tablet Take 325 mg by mouth 2 (two) times daily with a meal.    Yes Historical Provider, MD  furosemide (LASIX) 20 MG tablet Take 1 tablet (20 mg total) by mouth 2 (two) times daily. 02/27/14  Yes Rowe Clack, MD  losartan (COZAAR) 25 MG tablet Take 1 tablet (25 mg total) by mouth daily. 02/18/14  Yes Shanker Kristeen Mans, MD  metFORMIN (GLUCOPHAGE) 500 MG tablet Take 500 mg by mouth 2 (two) times daily with a meal.   Yes Historical Provider, MD  nitroGLYCERIN (NITROSTAT) 0.4 MG SL tablet Place 1 tablet (0.4 mg total) under the tongue every 5 (five) minutes as needed for chest pain. 02/27/14  Yes Thayer Headings, MD   Family History  Family History  Problem Relation Age of Onset  . Coronary artery disease    . Diabetes    . Colon cancer    . Lung cancer Sister   . Cancer Sister     lung  . Cancer Brother     lung  . Cancer Sister     lung  . Cancer Father     died in his 65s.  . Cancer Mother    Social History  History   Social History  . Marital Status: Married    Spouse Name: N/A    Number of Children: N/A  . Years of Education: N/A   Occupational History  . Not on file.   Social History Main Topics  . Smoking status: Former Smoker -- 2.00 packs/day for 40 years    Types: Cigarettes    Quit date: 08/29/1996  . Smokeless tobacco: Never Used  . Alcohol Use:  1.2 oz/week    2 Cans of beer per week  . Drug Use: No  . Sexual Activity: Yes   Other Topics Concern  . Not on file   Social History Narrative   Lives in [place] with wife, son, and son's family.     Review of Systems General:  No chills, fever, night sweats or weight changes.  Cardiovascular:  +++  chest pain, +++ dyspnea on exertion, +++ LE edema, no orthopnea, palpitations, paroxysmal nocturnal dyspnea. Dermatological: No rash, lesions/masses Respiratory: No cough, +++ dyspnea Urologic: No hematuria, dysuria Abdominal:   +++ nausea and belching with c/p, no vomiting, diarrhea, bright red blood per rectum, melena, or hematemesis Neurologic:  No visual changes, wkns, changes in mental status. All other systems reviewed and are otherwise negative except as noted above.  Physical Exam  Blood pressure 128/60, pulse 65, temperature 99 F (37.2 C), temperature source Oral, resp. rate 18, SpO2 97.00%.  General: Pleasant, NAD Psych: Normal affect. Neuro: Alert and oriented X 3. Moves all extremities spontaneously. HEENT: Normal  Neck: Supple without bruits or JVD. Lungs:  Resp regular and unlabored, CTA. Heart: RRR no s3, s4, or murmurs. Abdomen: Soft, non-tender, non-distended, BS + x 4.  Extremities: No clubbing, cyanosis.  Chronic venous stasis changes with trace bilat LE edema. DP/PT/Radials 2+ and equal bilaterally.  Labs  Troponin [facility] Test)  Recent Labs  03/27/14 1458  TROPIPOC 0.00   Lab Results  Component Value Date   WBC 3.7* 03/27/2014   HGB 9.0* 03/27/2014   HCT 29.5* 03/27/2014   MCV 87.5 03/27/2014   PLT 122* 03/27/2014     Recent Labs Lab 03/27/14 1237  NA 141  K 4.2  CL 104  CO2 24  BUN 16  CREATININE 0.68  CALCIUM 8.7  GLUCOSE 211*   Lab Results  Component Value Date   CHOL 94 07/16/2013   HDL 36.10* 07/16/2013   LDLCALC 43 07/16/2013   TRIG 75.0 07/16/2013    Radiology/Studies  Dg Chest 2 View  03/27/2014   CLINICAL DATA:   Left-sided chest pain and arm tingling; history of coronary artery stent placement and diabetes  EXAM: CHEST  2 VIEW  COMPARISON:  Portable chest x-ray dated February 21, 2013  FINDINGS: The lungs are adequately inflated. There is no focal infiltrate. The heart and pulmonary vascularity are normal. There is no pleural effusion or pneumothorax or pneumomediastinum. The bony thorax exhibits no acute abnormality.  IMPRESSION: There is no acute cardiopulmonary abnormality.   Electronically Signed   By: David  Martinique   On: 03/27/2014 13:51   ECG  Rsr, 79, inc rbbb, no acute st/t changes.  ASSESSMENT AND PLAN  1.  [place]/CAD:  Pt presents with a 1 month hx of progressive exertional chest pain and dyspnea similar to prior angina.  He has been off of effient since June, however DES was previously placed nearly 2 yrs ago.  He has been on ASA.  Plan to admit and continue to cycle CE. With recent admission 2/2 UGIB and anemia, along with absence of Ss @ rest, will not add heparin @ this time.  He will need diagnostic cath tomorrow however if it is felt that he requires a percutaneous intervention, we will have to first get GI (Dr. Watt Climes) involved as it was previously advised that pt avoid P2Y12 inhibitors going forward (D/C summary 6/23).  Cont home doses of asa/statin.  2.  HTN:  Stable.  Cont ARB.  3.  DM:  Hold metformin.  Add SSI.  4.  HL:  Cont statin.  LDL 43 in 06/2013. LFT's ok 02/27/2014.  5.  Normocytic anemia with recent UGIB/Barrett's esophagus/hepatic cirrhosis:  Hold off on anticoagulation for now.  Follow H/H - currently stable.  As above, if he requires anticoagulation or DAPT, we will need to discuss further with GI.   Jeanice Lim, NP 03/27/2014, 4:09 PM  I have personally seen and examined this patient with Ignacia Bayley, NP. I agree with the assessment and plan as outlined above. Pt presenting with classic anginal type chest pain. Would classify as class III unstable angina. He is  known to have moderate CAD by cath 2013 with 50-60% distal left main stenosis, moderate RCA and Circumflex disease. LAD was stented at that time. He will need cardiac cath to define his coronary anatomy. If he is found to have progression of CAD, treatment strategy will be complicated. He has had recent anemia and upper endoscopy with portal gastropathy, Barrett's esophagitis. His Effient was stopped at that time.  If he has progression of left main disease, will need CABG. If he has focal progression of disease, PCI should be delayed until he is seen by GI and a strategy is in place for anti-coagulation.   MCALHANY,CHRISTOPHER 03/27/2014 4:09 PM

## 2014-03-27 NOTE — ED Notes (Signed)
Pt c/o mid sternal CP worse with activity x several days; pt denies pain at present after belching per pt; pt sts some SOB with pain; pain in non radiating

## 2014-03-27 NOTE — ED Provider Notes (Addendum)
CSN: [id number]     Arrival date & time 03/27/14  1230 History   First MD Initiated Contact with Patient 03/27/14 1336     Chief Complaint  Patient presents with  . Chest Pain    Patient is a 68 y.o. male presenting with chest pain.  Chest Pain Pain location:  Substernal area Pain quality: pressure   Pain radiates to:  Does not radiate Pain radiates to the back: no   Pain severity:  Moderate Duration: He has been having symptoms for over the last month..  Today he had a severe episode walking to the store. Timing:  Intermittent Progression:  Worsening Chronicity:  Recurrent Relieved by:  Rest Worsened by:  Exertion Associated symptoms: shortness of breath   Associated symptoms: no fever, not vomiting and no weakness   Risk factors: coronary artery disease    patient has a history of heart disease. He had stents back in 2013. Was recently in the hospital for a GI bleed. During that time he had to stop some of his medications patient has noticed over the last month he has had some episodes of chest discomfort. They're often brought on by exertion. Today he had an episode that was very severe when walking to the store and he had to stop. This concerned him and brought him into the emergency department. He feels it is similar to his prior anginal pain.  Past Medical History  Diagnosis Date  . Unspecified essential hypertension   . Coronary artery disease CARDIOLOGIST-  DR Cathie Olden    PTCA/DES to LAD 06/28/12  . S/P drug eluting coronary stent placement     06-28-2012  X1  DES  PROXIMAL LAD  . Type 2 diabetes mellitus   . GERD (gastroesophageal reflux disease)   . Phimosis   . History of kidney stones   . Hyperlipidemia   . History of concussion     1976--  NO RESIDUAL  . Iron deficiency anemia   . History of GI bleed     UPPER GASTRIC ULCER 2 YRS AGO-- NO ISSUES  . History of gout     2007 &  2008  LEFT LEG-- NO ISSUE SINCE  . Chronic low back pain   . Arthritis   . OA  (osteoarthritis of spine)     LOWER BACK--  INTERMITTANT LEFT LEG NUMBNESS  . OSA (obstructive sleep apnea)     PULMOLOGIST-  DR CLANCE--  MODERATE OSA  STARTED CPAP 2012--  BUT CURRENTLY HAS NOT USED PAST 6 MONTHS  . Cirrhosis     CT a/p 01/2014   Past Surgical History  Procedure Laterality Date  . Esophagogastroduodenoscopy  08/15/2012    Procedure: ESOPHAGOGASTRODUODENOSCOPY (EGD);  Surgeon: Wonda Horner, MD;  Location: [facility] ENDOSCOPY;  Service: Endoscopy;  Laterality: N/A;  . Orif right ankle fx  1989  . Laparoscopic umbilical hernia repair w/ mesh  06-06-2011  . Open appendectomy w/ partial cecectomy  05-16-2004  . Dobutamine stress echo  06-08-2012    MODERATE HYPOKINESIS/ ISCHEMIA MID INFERIOR WALL  . Coronary angioplasty with stent placement  06/28/2012  DR COOPER    PCI W/  X1 DES to [place]. LAD/  LM  40% OSTIAL & 50-60% DISTAL /  50% PROX LCX/  30-40% PROX RCA & 50% MID RCA/   LVEF 65-70%  . Nephrolithotomy  1990'S  . Circumcision N/A 09/09/2013    Procedure: CIRCUMCISION ADULT;  Surgeon: Bernestine Amass, MD;  Location: [facility];  Service: Urology;  Laterality: N/A;  . Esophagogastroduodenoscopy N/A 02/17/2014    Procedure: ESOPHAGOGASTRODUODENOSCOPY (EGD);  Surgeon: Jeryl Columbia, MD;  Location: [facility] ENDOSCOPY;  Service: Endoscopy;  Laterality: N/A;   Family History  Problem Relation Age of Onset  . Coronary artery disease    . Diabetes    . Colon cancer    . Lung cancer Sister   . Cancer Sister     lung  . Cancer Brother     lung  . Cancer Sister     lung   History  Substance Use Topics  . Smoking status: Former Smoker -- 2.00 packs/day for 40 years    Types: Cigarettes    Quit date: 08/29/1996  . Smokeless tobacco: Never Used  . Alcohol Use: 1.2 oz/week    2 Cans of beer per week    Review of Systems  Constitutional: Negative for fever.  Respiratory: Positive for shortness of breath.   Cardiovascular: Positive for chest pain.  Gastrointestinal:  Negative for vomiting.  Neurological: Negative for weakness.  All other systems reviewed and are negative.     Allergies  Review of patient's allergies indicates no known allergies.  Home Medications   Prior to Admission medications   Medication Sig Start Date End Date Taking? Authorizing Provider  aspirin 81 MG chewable tablet Chew 1 tablet (81 mg total) by mouth daily. 02/25/14  Yes Shanker Kristeen Mans, MD  atorvastatin (LIPITOR) 80 MG tablet Take 80 mg by mouth daily.   Yes Historical Provider, MD  esomeprazole (NEXIUM) 20 MG capsule Take 20 mg by mouth 2 (two) times daily.   Yes Historical Provider, MD  ferrous sulfate 325 (65 FE) MG tablet Take 325 mg by mouth 2 (two) times daily with a meal.    Yes Historical Provider, MD  furosemide (LASIX) 20 MG tablet Take 1 tablet (20 mg total) by mouth 2 (two) times daily. 02/27/14  Yes Rowe Clack, MD  losartan (COZAAR) 25 MG tablet Take 1 tablet (25 mg total) by mouth daily. 02/18/14  Yes Shanker Kristeen Mans, MD  metFORMIN (GLUCOPHAGE) 500 MG tablet Take 500 mg by mouth 2 (two) times daily with a meal.   Yes Historical Provider, MD  nitroGLYCERIN (NITROSTAT) 0.4 MG SL tablet Place 1 tablet (0.4 mg total) under the tongue every 5 (five) minutes as needed for chest pain. 02/27/14  Yes Thayer Headings, MD   BP 133/54  Pulse 81  Temp(Src) 99 F (37.2 C) (Oral)  Resp 13  SpO2 98% Physical Exam  Nursing note and vitals reviewed. Constitutional: He appears well-developed and well-nourished. No distress.  HENT:  Head: Normocephalic and atraumatic.  Right Ear: External ear normal.  Left Ear: External ear normal.  Eyes: Conjunctivae are normal. Right eye exhibits no discharge. Left eye exhibits no discharge. No scleral icterus.  Neck: Neck supple. No tracheal deviation present.  Cardiovascular: Normal rate, regular rhythm and intact distal pulses.   Pulmonary/Chest: Effort normal and breath sounds normal. No stridor. No respiratory distress. He  has no wheezes. He has no rales.  Abdominal: Soft. Bowel sounds are normal. He exhibits no distension. There is no tenderness. There is no rebound and no guarding.  Musculoskeletal: He exhibits no edema and no tenderness.  Neurological: He is alert. He has normal strength. No cranial nerve deficit (no facial droop, extraocular movements intact, no slurred speech) or sensory deficit. He exhibits normal muscle tone. He displays no seizure activity. Coordination normal.  Skin: Skin is warm  and dry. No rash noted.  Psychiatric: He has a normal mood and affect.    ED Course  Procedures (including critical care time) Labs Review Labs Reviewed  CBC - Abnormal; Notable for the following:    WBC 3.7 (*)    RBC 3.37 (*)    Hemoglobin 9.0 (*)    HCT 29.5 (*)    RDW 15.7 (*)    Platelets 122 (*)    All other components within normal limits  BASIC METABOLIC PANEL - Abnormal; Notable for the following:    Glucose, Bld 211 (*)    All other components within normal limits  I-STAT TROPOININ, ED    Imaging Review Dg Chest 2 View  03/27/2014   CLINICAL DATA:  Left-sided chest pain and arm tingling; history of coronary artery stent placement and diabetes  EXAM: CHEST  2 VIEW  COMPARISON:  Portable chest x-ray dated February 21, 2013  FINDINGS: The lungs are adequately inflated. There is no focal infiltrate. The heart and pulmonary vascularity are normal. There is no pleural effusion or pneumothorax or pneumomediastinum. The bony thorax exhibits no acute abnormality.  IMPRESSION: There is no acute cardiopulmonary abnormality.   Electronically Signed   By: David  Martinique   On: 03/27/2014 13:51     EKG Interpretation   Date/Time:  Thursday March 27 2014 12:33:51 EDT Ventricular Rate:  79 PR Interval:  180 QRS Duration: 94 QT Interval:  380 QTC Calculation: 435 R Axis:   88 Text Interpretation:  Normal sinus rhythm Incomplete right bundle branch  block Borderline ECG No significant change since last  tracing Confirmed by  Lauranne Beyersdorf  MD-J, Pinki Rottman ([id number]) on 03/27/2014 1:37:32 PM      MDM   Final diagnoses:  Unstable angina    Patient's symptoms are concerning for unstable angina. He does have history of heart disease and stents. He has not restarted his effient since his recent gi bleed admission.  I will consult with the cardiology service regarding further treatment. Patient may require additional testing such as nuclear medicine stress test or cardiac catheterization. Currently is chest pain free in the ED.   Dorie Rank, MD 03/27/14 [place], MD 03/27/14 [phone]

## 2014-03-27 NOTE — ED Notes (Signed)
Cardiology at bedside.

## 2014-03-28 ENCOUNTER — Encounter (HOSPITAL_COMMUNITY)
Admission: EM | Disposition: A | Discharge status: Home / Self Care | Payer: Self-pay | Source: Home / Self Care | Attending: Thoracic Surgery (Cardiothoracic Vascular Surgery)

## 2014-03-28 ENCOUNTER — Other Ambulatory Visit: Payer: Self-pay | Admitting: *Deleted

## 2014-03-28 ENCOUNTER — Inpatient Hospital Stay (HOSPITAL_COMMUNITY): Payer: Medicare Other

## 2014-03-28 DIAGNOSIS — I2510 Atherosclerotic heart disease of native coronary artery without angina pectoris: Secondary | ICD-10-CM

## 2014-03-28 HISTORY — PX: LEFT HEART CATHETERIZATION WITH CORONARY ANGIOGRAM: SHX5451

## 2014-03-28 LAB — GLUCOSE, CAPILLARY
Glucose-Capillary: 226 mg/dL — ABNORMAL HIGH (ref 70–99)
Glucose-Capillary: 115 mg/dL — ABNORMAL HIGH (ref 70–99)
Glucose-Capillary: 189 mg/dL — ABNORMAL HIGH (ref 70–99)
Glucose-Capillary: 149 mg/dL — ABNORMAL HIGH (ref 70–99)
Glucose-Capillary: 133 mg/dL — ABNORMAL HIGH (ref 70–99)

## 2014-03-28 LAB — TROPONIN I
Troponin I: <0.3 ng/mL (ref <0.30)
Troponin I: <0.3 ng/mL (ref <0.30)

## 2014-03-28 LAB — COMPREHENSIVE METABOLIC PANEL
ALT: 12 U/L (ref 0–53)
AST: 22 U/L (ref 0–37)
Albumin: 3.7 g/dL (ref 3.5–5.2)
Alkaline Phosphatase: 92 U/L (ref 39–117)
Anion gap: 9 (ref 5–15)
BILIRUBIN TOTAL: 0.3 mg/dL (ref 0.3–1.2)
BUN: 13 mg/dL (ref 6–23)
CALCIUM: 8.8 mg/dL (ref 8.4–10.5)
CO2: 27 mEq/L (ref 19–32)
Chloride: 104 mEq/L (ref 96–112)
Creatinine, Ser: 0.73 mg/dL (ref 0.50–1.35)
GFR calc non Af Amer: >90 mL/min (ref >90)
GLUCOSE: 101 mg/dL — AB (ref 70–99)
POTASSIUM: 4.4 meq/L (ref 3.7–5.3)
Sodium: 140 mEq/L (ref 137–147)
Total Protein: 6.8 g/dL (ref 6.0–8.3)

## 2014-03-28 LAB — CBC
HCT: 28.9 % — ABNORMAL LOW (ref 39.0–52.0)
Hemoglobin: 9 g/dL — ABNORMAL LOW (ref 13.0–17.0)
MCH: 27.3 pg (ref 26.0–34.0)
MCHC: 31.1 g/dL (ref 30.0–36.0)
MCV: 87.6 fL (ref 78.0–100.0)
PLATELETS: 118 10*3/uL — AB (ref 150–400)
RBC: 3.3 MIL/uL — ABNORMAL LOW (ref 4.22–5.81)
RDW: 15.8 % — ABNORMAL HIGH (ref 11.5–15.5)
WBC: 3.5 10*3/uL — ABNORMAL LOW (ref 4.0–10.5)

## 2014-03-28 SURGERY — LEFT HEART CATHETERIZATION WITH CORONARY ANGIOGRAM
Anesthesia: LOCAL

## 2014-03-28 MED ORDER — VERAPAMIL HCL 2.5 MG/ML IV SOLN
INTRAVENOUS | Status: AC
  Filled 2014-03-28: qty 2

## 2014-03-28 MED ORDER — PANTOPRAZOLE SODIUM 40 MG PO TBEC
40.0000 mg | DELAYED_RELEASE_TABLET | Freq: Every day | ORAL | Status: DC
  Administered 2014-03-29 – 2014-03-30 (×2): 40 mg via ORAL
  Filled 2014-03-28 (×2): qty 1

## 2014-03-28 MED ORDER — HEPARIN SODIUM (PORCINE) 1000 UNIT/ML IJ SOLN
INTRAMUSCULAR | Status: AC
  Filled 2014-03-28: qty 1

## 2014-03-28 MED ORDER — ONDANSETRON HCL 4 MG/2ML IJ SOLN: 4.0000 mg | Freq: Four times a day (QID) | INTRAMUSCULAR | Status: DC | PRN

## 2014-03-28 MED ORDER — SODIUM CHLORIDE 0.9 % IV SOLN: INTRAVENOUS | Status: AC

## 2014-03-28 MED ORDER — FENTANYL CITRATE 0.05 MG/ML IJ SOLN
INTRAMUSCULAR | Status: AC
  Filled 2014-03-28: qty 2

## 2014-03-28 MED ORDER — ALBUTEROL SULFATE (2.5 MG/3ML) 0.083% IN NEBU
2.5000 mg | INHALATION_SOLUTION | Freq: Once | RESPIRATORY_TRACT | Status: AC
  Administered 2014-03-28: 2.5 mg via RESPIRATORY_TRACT

## 2014-03-28 MED ORDER — NITROGLYCERIN 1 MG/10 ML FOR IR/CATH LAB
INTRA_ARTERIAL | Status: AC
  Filled 2014-03-28: qty 10

## 2014-03-28 MED ORDER — LIDOCAINE HCL (PF) 1 % IJ SOLN
INTRAMUSCULAR | Status: AC
  Filled 2014-03-28: qty 30

## 2014-03-28 MED ORDER — MIDAZOLAM HCL 2 MG/2ML IJ SOLN
INTRAMUSCULAR | Status: AC
  Filled 2014-03-28: qty 2

## 2014-03-28 NOTE — Care Management Note (Signed)
    Page 1 of 2   04/04/2014     3:33:01 PM CARE MANAGEMENT NOTE 04/04/2014  Patient:  [name], [name]   Account Number:  [id number]  Date Initiated:  03/28/2014  Documentation initiated by:  GRAVES-BIGELOW,BRENDA  Subjective/Objective Assessment:   Pt admitted for cp. S/p cath-  Cath revealed: severe distal LM-CVTS to consult. Pt has family support.     Action/Plan:   CM will continue to monitor for disposition needs.   Anticipated DC Date:  04/01/2014   Anticipated DC Plan:  [place]  CM consult      Choice offered to / List presented to:     DME arranged  Vassie Moselle      DME agency  [facility].        Status of service:  Completed, signed off Medicare Important Message given?  YES (If response is "NO", the following Medicare IM given date fields will be blank) Date Medicare IM given:  03/31/2014 Medicare IM given by:  [facility] Date Additional Medicare IM given:  04/04/2014 Additional Medicare IM given by:  [name]  Discharge Disposition:    Per UR Regulation:  Reviewed for med. necessity/level of care/duration of stay  If discussed at [place] of Stay Meetings, dates discussed:    Comments:  ContactDeov[place], [name] [phone]   [phone]                 John, [name]     [phone]  04/04/14 John Lambert, RN, BSN [phone] Pt requests RW for home.  Referral to [name] for DME needs. Pt denies any other needs for home.  04-03-14 10:15am [name], [name] Sitting up in chair. Still somewhat nauseated.  Confirmed he does live with his wife, she is able and will be with him 24/7 post discharge for as long as needed.  CM will continue to follow.   04-01-14 9:10am [place] [phone] Post op CABG x3 on 8-3.  03/31/2014 UR completed. Additional Medicare IM given. John Finner RN CCM Case Mgmt phone [phone]

## 2014-03-28 NOTE — CV Procedure (Signed)
     Left Heart Catheterization with Coronary Angiography  Report  [name]  68 y.o.  male 1945-10-01  Procedure Date: 03/28/2014 Referring Physician: Darlina Guys, M.D. Primary Cardiologist: Mertie Moores, M.D.  INDICATIONS: Class III angina with prior history of CAD and LAD stenting  PROCEDURE: 1. Left heart catheterization; 2. Coronary angiography; 3. Left ventriculography  CONSENT:  The risks, benefits, and details of the procedure were explained in detail to the patient. Risks including death, stroke, heart attack, kidney injury, allergy, limb ischemia, bleeding and radiation injury were discussed.  The patient verbalized understanding and wanted to proceed.  Informed written consent was obtained.  PROCEDURE TECHNIQUE:  After Xylocaine anesthesia a 5 French Slender sheath was placed in the right radial artery with an angiocath and the modified Seldinger technique.  Coronary angiography was done using a 5 F JR 4 and JL 3.5 cm diagnostic catheter.  Left ventriculography was done using the JR 4 catheter and hand injection.   Angiograms were reviewed and the case was terminated.   CONTRAST:  Total of 50 cc.  COMPLICATIONS:  None   HEMODYNAMICS:  Aortic pressure 119/59 mmHg; LV pressure 126/4 mmHg; LVEDP 21 mm mercury  ANGIOGRAPHIC DATA:   The left main coronary artery is heavily calcified. There is 30% proximal stenosis. An 70-80% distal left main stenosis.  The left anterior descending artery is patent, contains a proximal stent, and an eccentric ostial 60-70% narrowing. The distal vessel is large.  The left circumflex artery is calcified with ostial 90%, hazy stenosis. The mid vessel contains segmental 80-90% narrowing before ending, trifurcating obtuse marginal.  The right coronary artery is heavy proximal to mid calcification. There is proximal and mid 50% narrowing. Large PDA and left ventricular branch arises distally.Marland Kitchen   LEFT VENTRICULOGRAM:  Left ventricular  angiogram was done in the 30 RAO projection and revealed a cavity that is upper limit of normal in size. Contractility is normal with an EF of 60%.   IMPRESSIONS:  1. Severe distal left main with involvement of the ostium of the LAD and critical narrowing of the ostial circumflex. The mid circumflex is also diseased. The proximal LAD stent is widely patent. 2. Heavy calcification with moderate obstruction in the proximal and mid RCA 3. Normal left ventricular systolic function and hemodynamics.   RECOMMENDATION:  Heart team approach. Anatomy dictates bypass surgery however his comorbidities including cirrhosis and anemia may jeopardize his candidacy. Likewise GI bleeding, anemia, and cirrhosis have resulted in discontinuation of dual antiplatelet therapy. I will contact [facility].

## 2014-03-28 NOTE — Interval H&P Note (Signed)
Cath Lab Visit (complete for each Cath Lab visit)  Clinical Evaluation Leading to the Procedure:   ACS: Yes.    Non-ACS:    Anginal Classification: CCS III  Anti-ischemic medical therapy: No Therapy  Non-Invasive Test Results: No non-invasive testing performed  Prior CABG: No previous CABG      History and Physical Interval Note:  03/28/2014 7:48 AM  [name]  has presented today for surgery, with the diagnosis of cp  The various methods of treatment have been discussed with the patient and family. After consideration of risks, benefits and other options for treatment, the patient has consented to  Procedure(s): LEFT HEART CATHETERIZATION WITH CORONARY ANGIOGRAM (N/A) as a surgical intervention .  The patient's history has been reviewed, patient examined, no change in status, stable for surgery.  I have reviewed the patient's chart and labs.  Questions were answered to the patient's satisfaction.     Sinclair Grooms

## 2014-03-28 NOTE — H&P (View-Only) (Signed)
     SUBJECTIVE: No chest pain overnight.   BP 100/46  Pulse 62  Temp(Src) 97.7 F (36.5 C) (Oral)  Resp 18  Wt 250 lb 10.6 oz (113.7 kg)  SpO2 95% No intake or output data in the 24 hours ending 03/28/14 0635  PHYSICAL EXAM General: Well developed, well nourished, in no acute distress. Alert and oriented x 3.  Psych:  Good affect, responds appropriately Neck: No JVD. No masses noted.  Lungs: Clear bilaterally with no wheezes or rhonci noted.  Heart: RRR with no murmurs noted. Abdomen: Bowel sounds are present. Soft, non-tender.  Extremities: No lower extremity edema.   LABS: Basic Metabolic Panel:  Recent Labs  03/27/14 1237 03/28/14 0130  NA 141 140  K 4.2 4.4  CL 104 104  CO2 24 27  GLUCOSE 211* 101*  BUN 16 13  CREATININE 0.68 0.73  CALCIUM 8.7 8.8   CBC:  Recent Labs  03/27/14 1237 03/28/14 0130  WBC 3.7* 3.5*  HGB 9.0* 9.0*  HCT 29.5* 28.9*  MCV 87.5 87.6  PLT 122* 118*   Cardiac Enzymes:  Recent Labs  03/27/14 1940 03/28/14 0118  TROPONINI <0.30 <0.30   Current Meds: . aspirin  81 mg Oral Daily  . atorvastatin  80 mg Oral Daily  . ferrous sulfate  325 mg Oral BID WC  . furosemide  20 mg Oral BID  . insulin aspart  0-15 Units Subcutaneous TID WC  . losartan  25 mg Oral Daily  . pantoprazole sodium  40 mg Oral Daily  . sodium chloride  3 mL Intravenous Q12H  . sodium chloride  3 mL Intravenous Q12H     ASSESSMENT AND PLAN:  1.CAD/Unstable angina: Pt admitted with class III unstable angina. He has been off of Effient since June due to new diagnosis of cirrhosis with portal gastropathy, Barretts esophagitis, anemia. He has tolerated ASA. Plans for diagnostic cath today. If he has progression of CAD, will have to involve GI before PCI. He did have 60% distal left main stenosis by cath in 2013 so he may have surgical disease. Dr. Watt Climes is his GI specialist.    2. HTN: Stable. Cont ARB.   3. DM: Hold metformin. Continue SSI.   4. HL:  Cont statin. LDL 43 in 06/2013. LFT's ok 02/27/2014.   5. Normocytic anemia with recent UGIB/Barrett's esophagus/hepatic cirrhosis: Hold off on anticoagulation for now. Follow H/H - currently stable. As above, if he requires anticoagulation or DAPT, we will need to discuss further with GI.    Harlee Eckroth  7/31/20156:35 AM

## 2014-03-28 NOTE — Progress Notes (Signed)
     SUBJECTIVE: No chest pain overnight.   BP 100/46  Pulse 62  Temp(Src) 97.7 F (36.5 C) (Oral)  Resp 18  Wt 250 lb 10.6 oz (113.7 kg)  SpO2 95% No intake or output data in the 24 hours ending 03/28/14 0635  PHYSICAL EXAM General: Well developed, well nourished, in no acute distress. Alert and oriented x 3.  Psych:  Good affect, responds appropriately Neck: No JVD. No masses noted.  Lungs: Clear bilaterally with no wheezes or rhonci noted.  Heart: RRR with no murmurs noted. Abdomen: Bowel sounds are present. Soft, non-tender.  Extremities: No lower extremity edema.   LABS: Basic Metabolic Panel:  Recent Labs  03/27/14 1237 03/28/14 0130  NA 141 140  K 4.2 4.4  CL 104 104  CO2 24 27  GLUCOSE 211* 101*  BUN 16 13  CREATININE 0.68 0.73  CALCIUM 8.7 8.8   CBC:  Recent Labs  03/27/14 1237 03/28/14 0130  WBC 3.7* 3.5*  HGB 9.0* 9.0*  HCT 29.5* 28.9*  MCV 87.5 87.6  PLT 122* 118*   Cardiac Enzymes:  Recent Labs  03/27/14 1940 03/28/14 0118  TROPONINI <0.30 <0.30   Current Meds: . aspirin  81 mg Oral Daily  . atorvastatin  80 mg Oral Daily  . ferrous sulfate  325 mg Oral BID WC  . furosemide  20 mg Oral BID  . insulin aspart  0-15 Units Subcutaneous TID WC  . losartan  25 mg Oral Daily  . pantoprazole sodium  40 mg Oral Daily  . sodium chloride  3 mL Intravenous Q12H  . sodium chloride  3 mL Intravenous Q12H     ASSESSMENT AND PLAN:  1.CAD/Unstable angina: Pt admitted with class III unstable angina. He has been off of Effient since June due to new diagnosis of cirrhosis with portal gastropathy, Barretts esophagitis, anemia. He has tolerated ASA. Plans for diagnostic cath today. If he has progression of CAD, will have to involve GI before PCI. He did have 60% distal left main stenosis by cath in 2013 so he may have surgical disease. Dr. Watt Climes is his GI specialist.    2. HTN: Stable. Cont ARB.   3. DM: Hold metformin. Continue SSI.   4. HL:  Cont statin. LDL 43 in 06/2013. LFT's ok 02/27/2014.   5. Normocytic anemia with recent UGIB/Barrett's esophagus/hepatic cirrhosis: Hold off on anticoagulation for now. Follow H/H - currently stable. As above, if he requires anticoagulation or DAPT, we will need to discuss further with GI.    MCALHANY,CHRISTOPHER  7/31/20156:35 AM

## 2014-03-28 NOTE — Progress Notes (Signed)
UR Completed Jeremie Giangrande Graves-Bigelow, RN,BSN [phone]  

## 2014-03-29 ENCOUNTER — Encounter (HOSPITAL_COMMUNITY): Payer: Self-pay | Admitting: Cardiology

## 2014-03-29 DIAGNOSIS — I2510 Atherosclerotic heart disease of native coronary artery without angina pectoris: Secondary | ICD-10-CM

## 2014-03-29 DIAGNOSIS — Z01810 Encounter for preprocedural cardiovascular examination: Secondary | ICD-10-CM

## 2014-03-29 DIAGNOSIS — Z8719 Personal history of other diseases of the digestive system: Secondary | ICD-10-CM

## 2014-03-29 LAB — URINALYSIS, ROUTINE W REFLEX MICROSCOPIC
BILIRUBIN URINE: NEGATIVE
GLUCOSE, UA: NEGATIVE mg/dL
Hgb urine dipstick: NEGATIVE
Ketones, ur: NEGATIVE mg/dL
Leukocytes, UA: NEGATIVE
Nitrite: NEGATIVE
PH: 6 (ref 5.0–8.0)
Protein, ur: NEGATIVE mg/dL
SPECIFIC GRAVITY, URINE: 1.011 (ref 1.005–1.030)
Urobilinogen, UA: 0.2 mg/dL (ref 0.0–1.0)

## 2014-03-29 LAB — BLOOD GAS, ARTERIAL
Acid-base deficit: 0.1 mmol/L (ref 0.0–2.0)
Bicarbonate: 24 mEq/L (ref 20.0–24.0)
DRAWN BY: 24610
FIO2: 0.21 %
O2 Saturation: 96.6 %
PCO2 ART: 38.7 mmHg (ref 35.0–45.0)
PH ART: 7.41 (ref 7.350–7.450)
PO2 ART: 83 mmHg (ref 80.0–100.0)
Patient temperature: 98.6
TCO2: 25.2 mmol/L (ref 0–100)

## 2014-03-29 LAB — GLUCOSE, CAPILLARY
GLUCOSE-CAPILLARY: 138 mg/dL — AB (ref 70–99)
GLUCOSE-CAPILLARY: 148 mg/dL — AB (ref 70–99)
Glucose-Capillary: 116 mg/dL — ABNORMAL HIGH (ref 70–99)
Glucose-Capillary: 132 mg/dL — ABNORMAL HIGH (ref 70–99)

## 2014-03-29 MED ORDER — BISACODYL 5 MG PO TBEC
5.0000 mg | DELAYED_RELEASE_TABLET | Freq: Once | ORAL | Status: DC
  Filled 2014-03-29: qty 1

## 2014-03-29 MED ORDER — ALPRAZOLAM 0.25 MG PO TABS: 0.2500 mg | ORAL_TABLET | ORAL | Status: DC | PRN

## 2014-03-29 NOTE — Progress Notes (Addendum)
[id number] Cardiac Rehab Pt states that he has been up walking in hall independently. He denies any cp or SOB. Completed pre-op education with pt. He voices understanding. He states that he does not read and is scared that he may forget something I had told him. He ask me to call his wife at home. I called his wife. I left her pre-op surgery booklet and pt care guide.I put pre-op surgery video for him to watch. We discussed sternal precautions, walking post-op and use of IS. They voice understanding.We will follow pt post-op as ordered. Pt's wife states that she can provide 24/7 care at discharge for the first week. Deon Pilling, RN 03/29/2014 3:16 PM

## 2014-03-29 NOTE — Progress Notes (Signed)
VASCULAR LAB PRELIMINARY  PRELIMINARY  PRELIMINARY  PRELIMINARY  Pre-op Cardiac Surgery  Carotid Findings:  Bilateral:  1-39% ICA stenosis.  Vertebral artery flow is antegrade.      Upper Extremity Right Left  Brachial Pressures triphasic 140 triphasic  Radial Waveforms  triphasic  Ulnar Waveforms  biphasic  Palmar Arch (Allen's Test) * **   Findings:  *Right:  Palmar arch not done due to cardiac cath via radial artery.  **Left:  Doppler waveforms remain normal with ulnar and radial compressions.    Lower  Extremity Right Left  Dorsalis Pedis    Anterior Tibial 239 biphasic 178 biphasic  Posterior Tibial 174 biphasic 212 biphasic  Ankle/Brachial Indices >1.0 >1.0    Findings:  ABI is within normal limits with abnormal Doppler waveforms bilaterally.   Mikey Maffett, RVT 03/29/2014, 2:59 PM

## 2014-03-29 NOTE — Consult Note (Signed)
Reason for Consult:Left main disease Referring Physician: Dr. [name] is an 68 y.o. male.  HPI: 68 yo man with a history of CAD presented with a cc/o chest pain  [name] is a 68 yo man with a history of hypertension, type II DM, hyperlipidemia, remote tobacco abuse and known CAD. He had a DES placed in the LAD in 05/2012. In June he was admitted with a GI bleed and his aspirin and effient were stopped at that time. Over the past month he has been experiencing exertional chest pain. He says this occurred just about every day. It would resolve with rest and/ or sublingual nitroglycerin. On the day of admission he had a severe episode while walking home from church. He took several NTG, but continued to feel poorly with associated dyspnea, sweating and belching. His symptoms resolved when he got to the ED. He ruled out for MI.   Yesterday he underwent cardiac catheterization which revealed an 80% left main stenosis. There was a 50% stenosis in the RCA.  He was diagnosed with cirrhosis for the first time during his last admission. He had an EGD, which showed portal gastropathy, barrett's esophagus, and chronic active H pylori.   He currently is pain free.    Past Medical History  Diagnosis Date  . Unspecified essential hypertension   . Coronary artery disease     a. 05/2012 Cath/PCI: LM 40ost, 50-60d, LAD 99p ruptured plaque (3.0x28 DES), LCX 50p/m, RCA 30-40p, 109m EF 65-70%.  . Type 2 diabetes mellitus   . GERD (gastroesophageal reflux disease)   . Phimosis     a. s/p circumcision 2015.  .Marland KitchenHyperlipidemia   . History of concussion     1976--  NO RESIDUAL  . Iron deficiency anemia   . History of GI bleed     a. UGIB 07/2012;  b. 01/2014 admission with GIB/FOB stool req 1U prbc's->EGD showed portal gastropathy, barrett's esoph, and chronic active h. pylori gastritis.  .Marland KitchenHistory of gout     2007 &  2008  LEFT LEG-- NO ISSUE SINCE  . Chronic low back pain   . OSA (obstructive  sleep apnea)     PULMOLOGIST-  DR CLANCE--  MODERATE OSA  STARTED CPAP 2012--  BUT CURRENTLY HAS NOT USED PAST 6 MONTHS  . Hepatic cirrhosis     a. Dx 01/2014 - CT a/p   . Kidney stones   . Asthma   . History of blood transfusion     "related to bleeding ulcers"  . Arthritis     "joints tighten up sometimes" (03/27/2104)  . OA (osteoarthritis of spine)     LOWER BACK--  INTERMITTANT LEFT LEG NUMBNESS  . Chronic lower back pain     Past Surgical History  Procedure Laterality Date  . Esophagogastroduodenoscopy  08/15/2012    Procedure: ESOPHAGOGASTRODUODENOSCOPY (EGD);  Surgeon: SWonda Horner MD;  [facility];  Service: Endoscopy;  Laterality: N/A;  . Ankle fracture surgery Right 1989    "plate put in"  . Laparoscopic umbilical hernia repair w/ mesh  06-06-2011  . Open appendectomy w/ partial cecectomy  05-16-2004  . Dobutamine stress echo  06-08-2012    MODERATE HYPOKINESIS/ ISCHEMIA MID INFERIOR WALL  . Nephrolithotomy  1990'S  . Circumcision N/A 09/09/2013    Procedure: CIRCUMCISION ADULT;  Surgeon: DBernestine Amass MD;  [facility]  Service: Urology;  Laterality: N/A;  . Esophagogastroduodenoscopy N/A 02/17/2014    Procedure:  ESOPHAGOGASTRODUODENOSCOPY (EGD);  Surgeon: Jeryl Columbia, MD;  Location: [facility] ENDOSCOPY;  Service: Endoscopy;  Laterality: N/A;  . Hernia repair    . Appendectomy  05-16-2004    open  . Colectomy  05-16-2004  . Coronary angioplasty with stent placement  06/28/2012  DR COOPER    PCI W/  X1 DES to [place]. LAD/  LM  40% OSTIAL & 50-60% DISTAL /  50% PROX LCX/  30-40% PROX RCA & 50% MID RCA/   LVEF 65-70%    Family History  Problem Relation Age of Onset  . Coronary artery disease    . Diabetes    . Colon cancer    . Lung cancer Sister   . Cancer Sister     lung  . Cancer Brother     lung  . Cancer Sister     lung  . Cancer Father     died in his 57s.  . Cancer Mother     Social History:  reports that he quit  smoking about 17 years ago. His smoking use included Cigarettes. He has a 80 pack-year smoking history. He has never used smokeless tobacco. He reports that he drinks alcohol. He reports that he does not use illicit drugs.  Allergies: No Known Allergies  Medications:  Prior to Admission:  Prescriptions prior to admission  Medication Sig Dispense Refill  . aspirin 81 MG chewable tablet Chew 1 tablet (81 mg total) by mouth daily.      Marland Kitchen atorvastatin (LIPITOR) 80 MG tablet Take 80 mg by mouth daily.      Marland Kitchen esomeprazole (NEXIUM) 20 MG capsule Take 20 mg by mouth 2 (two) times daily.      . ferrous sulfate 325 (65 FE) MG tablet Take 325 mg by mouth 2 (two) times daily with a meal.       . furosemide (LASIX) 20 MG tablet Take 1 tablet (20 mg total) by mouth 2 (two) times daily.  60 tablet  3  . losartan (COZAAR) 25 MG tablet Take 1 tablet (25 mg total) by mouth daily.  90 tablet  0  . metFORMIN (GLUCOPHAGE) 500 MG tablet Take 500 mg by mouth 2 (two) times daily with a meal.      . nitroGLYCERIN (NITROSTAT) 0.4 MG SL tablet Place 1 tablet (0.4 mg total) under the tongue every 5 (five) minutes as needed for chest pain.  25 tablet  3    Results for orders placed during the hospital encounter of 03/27/14 (from the past 48 hour(s))  CBC     Status: Abnormal   Collection Time    03/27/14 12:37 PM      Result Value Ref Range   WBC 3.7 (*) 4.0 - 10.5 K/uL   RBC 3.37 (*) 4.22 - 5.81 MIL/uL   Hemoglobin 9.0 (*) 13.0 - 17.0 g/dL   HCT 29.5 (*) 39.0 - 52.0 %   MCV 87.5  78.0 - 100.0 fL   MCH 26.7  26.0 - 34.0 pg   MCHC 30.5  30.0 - 36.0 g/dL   RDW 15.7 (*) 11.5 - 15.5 %   Platelets 122 (*) 150 - 400 K/uL  BASIC METABOLIC PANEL     Status: Abnormal   Collection Time    03/27/14 12:37 PM      Result Value Ref Range   Sodium 141  137 - 147 mEq/L   Potassium 4.2  3.7 - 5.3 mEq/L   Chloride 104  96 - 112 mEq/L  CO2 24  19 - 32 mEq/L   Glucose, Bld 211 (*) 70 - 99 mg/dL   BUN 16  6 - 23 mg/dL    Creatinine, Ser 0.68  0.50 - 1.35 mg/dL   Calcium 8.7  8.4 - 10.5 mg/dL   GFR calc non Af Amer >90  >90 mL/min   GFR calc Af Amer >90  >90 mL/min   Comment: (NOTE)     The eGFR has been calculated using the CKD EPI equation.     This calculation has not been validated in all clinical situations.     eGFR's persistently <90 mL/min signify possible Chronic Kidney     Disease.   Anion gap 13  5 - 15  I-STAT TROPOININ, ED     Status: None   Collection Time    03/27/14  1:13 PM      Result Value Ref Range   Troponin i, poc 0.00  0.00 - 0.08 ng/mL   Comment 3            Comment: Due to the release kinetics of cTnI,     a negative result within the first hours     of the onset of symptoms does not rule out     myocardial infarction with certainty.     If myocardial infarction is still suspected,     repeat the test at appropriate intervals.  PROTIME-INR     Status: None   Collection Time    03/27/14  2:28 PM      Result Value Ref Range   Prothrombin Time 14.2  11.6 - 15.2 seconds   INR 1.10  0.00 - 1.49  I-STAT TROPOININ, ED     Status: None   Collection Time    03/27/14  2:58 PM      Result Value Ref Range   Troponin i, poc 0.00  0.00 - 0.08 ng/mL   Comment 3            Comment: Due to the release kinetics of cTnI,     a negative result within the first hours     of the onset of symptoms does not rule out     myocardial infarction with certainty.     If myocardial infarction is still suspected,     repeat the test at appropriate intervals.  GLUCOSE, CAPILLARY     Status: Abnormal   Collection Time    03/27/14  5:57 PM      Result Value Ref Range   Glucose-Capillary 226 (*) 70 - 99 mg/dL  TROPONIN I     Status: None   Collection Time    03/27/14  7:40 PM      Result Value Ref Range   Troponin I <0.30  <0.30 ng/mL   Comment:            Due to the release kinetics of cTnI,     a negative result within the first hours     of the onset of symptoms does not rule out      myocardial infarction with certainty.     If myocardial infarction is still suspected,     repeat the test at appropriate intervals.  GLUCOSE, CAPILLARY     Status: Abnormal   Collection Time    03/27/14  9:00 PM      Result Value Ref Range   Glucose-Capillary 144 (*) 70 - 99 mg/dL   Comment 1 Documented in Chart  Comment 2 Notify RN    TROPONIN I     Status: None   Collection Time    03/28/14  1:18 AM      Result Value Ref Range   Troponin I <0.30  <0.30 ng/mL   Comment:            Due to the release kinetics of cTnI,     a negative result within the first hours     of the onset of symptoms does not rule out     myocardial infarction with certainty.     If myocardial infarction is still suspected,     repeat the test at appropriate intervals.  COMPREHENSIVE METABOLIC PANEL     Status: Abnormal   Collection Time    03/28/14  1:30 AM      Result Value Ref Range   Sodium 140  137 - 147 mEq/L   Potassium 4.4  3.7 - 5.3 mEq/L   Chloride 104  96 - 112 mEq/L   CO2 27  19 - 32 mEq/L   Glucose, Bld 101 (*) 70 - 99 mg/dL   BUN 13  6 - 23 mg/dL   Creatinine, Ser 0.73  0.50 - 1.35 mg/dL   Calcium 8.8  8.4 - 10.5 mg/dL   Total Protein 6.8  6.0 - 8.3 g/dL   Albumin 3.7  3.5 - 5.2 g/dL   AST 22  0 - 37 U/L   ALT 12  0 - 53 U/L   Alkaline Phosphatase 92  39 - 117 U/L   Total Bilirubin 0.3  0.3 - 1.2 mg/dL   GFR calc non Af Amer >90  >90 mL/min   GFR calc Af Amer >90  >90 mL/min   Comment: (NOTE)     The eGFR has been calculated using the CKD EPI equation.     This calculation has not been validated in all clinical situations.     eGFR's persistently <90 mL/min signify possible Chronic Kidney     Disease.   Anion gap 9  5 - 15  CBC     Status: Abnormal   Collection Time    03/28/14  1:30 AM      Result Value Ref Range   WBC 3.5 (*) 4.0 - 10.5 K/uL   RBC 3.30 (*) 4.22 - 5.81 MIL/uL   Hemoglobin 9.0 (*) 13.0 - 17.0 g/dL   HCT 28.9 (*) 39.0 - 52.0 %   MCV 87.6  78.0 - 100.0 fL    MCH 27.3  26.0 - 34.0 pg   MCHC 31.1  30.0 - 36.0 g/dL   RDW 15.8 (*) 11.5 - 15.5 %   Platelets 118 (*) 150 - 400 K/uL   Comment: REPEATED TO VERIFY     SPECIMEN CHECKED FOR CLOTS     PLATELET COUNT CONFIRMED BY SMEAR  TROPONIN I     Status: None   Collection Time    03/28/14  6:40 AM      Result Value Ref Range   Troponin I <0.30  <0.30 ng/mL   Comment:            Due to the release kinetics of cTnI,     a negative result within the first hours     of the onset of symptoms does not rule out     myocardial infarction with certainty.     If myocardial infarction is still suspected,     repeat the test at appropriate intervals.  GLUCOSE, CAPILLARY     Status: Abnormal   Collection Time    03/28/14  8:51 AM      Result Value Ref Range   Glucose-Capillary 115 (*) 70 - 99 mg/dL  GLUCOSE, CAPILLARY     Status: Abnormal   Collection Time    03/28/14 12:00 PM      Result Value Ref Range   Glucose-Capillary 189 (*) 70 - 99 mg/dL  GLUCOSE, CAPILLARY     Status: Abnormal   Collection Time    03/28/14  3:43 PM      Result Value Ref Range   Glucose-Capillary 149 (*) 70 - 99 mg/dL  GLUCOSE, CAPILLARY     Status: Abnormal   Collection Time    03/28/14  8:44 PM      Result Value Ref Range   Glucose-Capillary 133 (*) 70 - 99 mg/dL   Comment 1 Notify RN    GLUCOSE, CAPILLARY     Status: Abnormal   Collection Time    03/29/14  7:45 AM      Result Value Ref Range   Glucose-Capillary 116 (*) 70 - 99 mg/dL   Comment 1 Documented in Chart     Comment 2 Notify RN      Dg Chest 2 View  03/27/2014   CLINICAL DATA:  Left-sided chest pain and arm tingling; history of coronary artery stent placement and diabetes  EXAM: CHEST  2 VIEW  COMPARISON:  Portable chest x-ray dated February 21, 2013  FINDINGS: The lungs are adequately inflated. There is no focal infiltrate. The heart and pulmonary vascularity are normal. There is no pleural effusion or pneumothorax or pneumomediastinum. The bony thorax  exhibits no acute abnormality.  IMPRESSION: There is no acute cardiopulmonary abnormality.   Electronically Signed   By: David  Jordan   On: 03/27/2014 13:51    Review of Systems  Constitutional: Positive for malaise/fatigue. Negative for fever and chills.  Respiratory: Positive for shortness of breath.   Cardiovascular: Positive for chest pain and leg swelling.  Gastrointestinal: Positive for nausea and blood in stool (duing recent admission, none currently).  All other systems reviewed and are negative.  Blood pressure 109/52, pulse 64, temperature 97.6 F (36.4 C), temperature source Oral, resp. rate 18, weight 250 lb 10.6 oz (113.7 kg), SpO2 97.00%. Physical Exam  Vitals reviewed. Constitutional: He is oriented to person, place, and time. He appears well-developed and well-nourished. No distress.  HENT:  Head: Normocephalic and atraumatic.  Eyes: EOM are normal. Pupils are equal, round, and reactive to light.  Neck: Neck supple.  No bruits  Cardiovascular: Normal rate, regular rhythm and normal heart sounds.   No murmur heard. Respiratory: Breath sounds normal. He has no wheezes. He has no rales.  GI: Soft. There is no tenderness.  Musculoskeletal: He exhibits edema.  Lymphadenopathy:    He has no cervical adenopathy.  Neurological: He is alert and oriented to person, place, and time. No cranial nerve deficit.  Skin:  Chronic venous stasis changes both LE   CARDIAC CATHETERIZATION  HEMODYNAMICS: Aortic pressure 119/59 mmHg; LV pressure 126/4 mmHg; LVEDP 21 mm mercury  ANGIOGRAPHIC DATA: The left main coronary artery is heavily calcified. There is 30% proximal stenosis. An 70-80% distal left main stenosis.  The left anterior descending artery is patent, contains a proximal stent, and an eccentric ostial 60-70% narrowing. The distal vessel is large.  The left circumflex artery is calcified with ostial 90%, hazy stenosis. The mid vessel contains segmental 80-90%   narrowing before  ending, trifurcating obtuse marginal.  The right coronary artery is heavy proximal to mid calcification. There is proximal and mid 50% narrowing. Large PDA and left ventricular branch arises distally.Marland Kitchen  LEFT VENTRICULOGRAM: Left ventricular angiogram was done in the 30 RAO projection and revealed a cavity that is upper limit of normal in size. Contractility is normal with an EF of 60%.  IMPRESSIONS: 1. Severe distal left main with involvement of the ostium of the LAD and critical narrowing of the ostial circumflex. The mid circumflex is also diseased. The proximal LAD stent is widely patent.  2. Heavy calcification with moderate obstruction in the proximal and mid RCA  3. Normal left ventricular systolic function and hemodynamics.  PT= 14.2, T bili= 0.3, albumin= 3.7  Assessment/Plan: 68 yo man with multiple CRF and known CAD presented with unstable angina. He ruled out for MI. At catheterization he was found to have severe left main disease. CABG is indicated for survival benefit and relief of symptoms.  He was recently diagnosed with cirrhosis, but his albumin, bilirubin and prothrombin time are normal. While it does increase his risk, it is not prohibitive.  I discussed the general nature of the procedure, the need for general anesthesia, the use of cardiopulmonary bypass, and the incisions to be used with Mr and Mrs Wence. We discussed the expected hospital stay, overall recovery and short and long term outcomes. I reviewed the indications, risks, benefits and alternatives with them. They understand the risks include, but are not limited to death, stroke, MI, DVT/PE, bleeding, possible need for transfusion, infections, cardiac arrhythmias, and other organ system dysfunction including respiratory, renal, or GI complications.   He accepts the risks and agrees to proceed.  For OR 1st case Monday 8/3  Chang Tiggs C 03/29/2014, 11:22 AM

## 2014-03-29 NOTE — Progress Notes (Signed)
Subjective:  No shortness of breath or chest pain  Objective:  Vital Signs in the last 24 hours: BP 109/52  Pulse 64  Temp(Src) 97.6 F (36.4 C) (Oral)  Resp 18  Wt 113.7 kg (250 lb 10.6 oz)  SpO2 97%  Physical Exam: Obese male in no acute distress Lungs:  Clear Cardiac:  Regular rhythm, normal S1 and S2, no S3 Abdomen:  Soft, nontender, no masses Extremities: Radial cath site clean and dry  Intake/Output from previous day:    Weight Filed Weights   03/27/14 1845 03/28/14 0514  Weight: 113.989 kg (251 lb 4.8 oz) 113.7 kg (250 lb 10.6 oz)    Lab Results: Basic Metabolic Panel:  Recent Labs  03/27/14 1237 03/28/14 0130  NA 141 140  K 4.2 4.4  CL 104 104  CO2 24 27  GLUCOSE 211* 101*  BUN 16 13  CREATININE 0.68 0.73   CBC:  Recent Labs  03/27/14 1237 03/28/14 0130  WBC 3.7* 3.5*  HGB 9.0* 9.0*  HCT 29.5* 28.9*  MCV 87.5 87.6  PLT 122* 118*   Cardiac Enzymes:  Recent Labs  03/27/14 1940 03/28/14 0118 03/28/14 0640  TROPONINI <0.30 <0.30 <0.30    Telemetry: Sinus rhythm  Assessment/Plan:  1. Severe left main coronary artery disease and moderate right coronary artery disease, patent stent 2. History of cirrhosis with GI bleeding 3. Obesity  Recommendations:  Family mentions that was seen by the cardiac surgeon but no note is on the chart. We'll contact them to confirm. Probable with history of GI bleeding and no recurrent chest pain hold off on anticoagulation at the present time.      Kerry Hough  MD [facility] Card[facility]  03/29/2014, 9:58 AM

## 2014-03-30 LAB — CBC
HCT: 31.3 % — ABNORMAL LOW (ref 39.0–52.0)
Hemoglobin: 9.6 g/dL — ABNORMAL LOW (ref 13.0–17.0)
MCH: 26.7 pg (ref 26.0–34.0)
MCHC: 30.7 g/dL (ref 30.0–36.0)
MCV: 86.9 fL (ref 78.0–100.0)
PLATELETS: 108 10*3/uL — AB (ref 150–400)
RBC: 3.6 MIL/uL — AB (ref 4.22–5.81)
RDW: 15.5 % (ref 11.5–15.5)
WBC: 4.6 10*3/uL (ref 4.0–10.5)

## 2014-03-30 LAB — BASIC METABOLIC PANEL
ANION GAP: 11 (ref 5–15)
BUN: 12 mg/dL (ref 6–23)
CALCIUM: 8.7 mg/dL (ref 8.4–10.5)
CO2: 27 meq/L (ref 19–32)
CREATININE: 0.71 mg/dL (ref 0.50–1.35)
Chloride: 99 mEq/L (ref 96–112)
GFR calc non Af Amer: >90 mL/min (ref >90)
Glucose, Bld: 113 mg/dL — ABNORMAL HIGH (ref 70–99)
Potassium: 4.2 mEq/L (ref 3.7–5.3)
Sodium: 137 mEq/L (ref 137–147)

## 2014-03-30 LAB — GLUCOSE, CAPILLARY
GLUCOSE-CAPILLARY: 109 mg/dL — AB (ref 70–99)
Glucose-Capillary: 141 mg/dL — ABNORMAL HIGH (ref 70–99)
Glucose-Capillary: 134 mg/dL — ABNORMAL HIGH (ref 70–99)
Glucose-Capillary: 139 mg/dL — ABNORMAL HIGH (ref 70–99)

## 2014-03-30 LAB — PROTIME-INR
INR: 1.16 (ref 0.00–1.49)
Prothrombin Time: 14.8 seconds (ref 11.6–15.2)

## 2014-03-30 LAB — APTT: aPTT: 32 seconds (ref 24–37)

## 2014-03-30 MED ORDER — TEMAZEPAM 15 MG PO CAPS
15.0000 mg | ORAL_CAPSULE | Freq: Once | ORAL | Status: AC | PRN
  Administered 2014-03-30: 15 mg via ORAL
  Filled 2014-03-30: qty 1

## 2014-03-30 MED ORDER — SODIUM CHLORIDE 0.9 % IV SOLN
1500.0000 mg | INTRAVENOUS | Status: AC
  Administered 2014-03-31: 1500 mg via INTRAVENOUS
  Filled 2014-03-30: qty 1500

## 2014-03-30 MED ORDER — EPINEPHRINE HCL 1 MG/ML IJ SOLN
0.5000 ug/min | INTRAVENOUS | Status: DC
  Filled 2014-03-30: qty 4

## 2014-03-30 MED ORDER — PLASMA-LYTE 148 IV SOLN
INTRAVENOUS | Status: AC
  Administered 2014-03-31: 07:00:00
  Filled 2014-03-30: qty 2.5

## 2014-03-30 MED ORDER — AMINOCAPROIC ACID 250 MG/ML IV SOLN
INTRAVENOUS | Status: DC
Start: 2014-03-31 — End: 2014-03-31
  Filled 2014-03-30: qty 40

## 2014-03-30 MED ORDER — DEXTROSE 5 % IV SOLN
1.5000 g | INTRAVENOUS | Status: AC
  Administered 2014-03-31: 1.5 g via INTRAVENOUS
  Administered 2014-03-31: .75 g via INTRAVENOUS
  Filled 2014-03-30: qty 1.5

## 2014-03-30 MED ORDER — DEXMEDETOMIDINE HCL IN NACL 400 MCG/100ML IV SOLN
0.1000 ug/kg/h | INTRAVENOUS | Status: AC
  Administered 2014-03-31: 0.3 ug/kg/h via INTRAVENOUS
  Filled 2014-03-30: qty 100

## 2014-03-30 MED ORDER — CHLORHEXIDINE GLUCONATE 4 % EX LIQD
60.0000 mL | Freq: Once | CUTANEOUS | Status: AC
  Administered 2014-03-30: 4 via TOPICAL
  Filled 2014-03-30: qty 60

## 2014-03-30 MED ORDER — DOPAMINE-DEXTROSE 3.2-5 MG/ML-% IV SOLN
2.0000 ug/kg/min | INTRAVENOUS | Status: DC
  Filled 2014-03-30: qty 250

## 2014-03-30 MED ORDER — POTASSIUM CHLORIDE 2 MEQ/ML IV SOLN
80.0000 meq | INTRAVENOUS | Status: DC
  Filled 2014-03-30: qty 40

## 2014-03-30 MED ORDER — SODIUM CHLORIDE 0.9 % IV SOLN
INTRAVENOUS | Status: AC
  Administered 2014-03-31: 1 [IU]/h via INTRAVENOUS
  Filled 2014-03-30: qty 1

## 2014-03-30 MED ORDER — DEXTROSE 5 % IV SOLN
750.0000 mg | INTRAVENOUS | Status: DC
  Filled 2014-03-30: qty 750

## 2014-03-30 MED ORDER — DIAZEPAM 5 MG PO TABS
5.0000 mg | ORAL_TABLET | Freq: Once | ORAL | Status: AC
  Administered 2014-03-31: 5 mg via ORAL
  Filled 2014-03-30: qty 1

## 2014-03-30 MED ORDER — NITROGLYCERIN IN D5W 200-5 MCG/ML-% IV SOLN
2.0000 ug/min | INTRAVENOUS | Status: DC
  Filled 2014-03-30: qty 250

## 2014-03-30 MED ORDER — SODIUM CHLORIDE 0.9 % IV SOLN
INTRAVENOUS | Status: DC
  Filled 2014-03-30: qty 30

## 2014-03-30 MED ORDER — METOPROLOL TARTRATE 12.5 MG HALF TABLET
12.5000 mg | ORAL_TABLET | Freq: Once | ORAL | Status: AC
  Administered 2014-03-31: 12.5 mg via ORAL
  Filled 2014-03-30: qty 1

## 2014-03-30 MED ORDER — PHENYLEPHRINE HCL 10 MG/ML IJ SOLN
30.0000 ug/min | INTRAVENOUS | Status: DC
  Filled 2014-03-30: qty 2

## 2014-03-30 MED ORDER — MAGNESIUM SULFATE 50 % IJ SOLN
40.0000 meq | INTRAMUSCULAR | Status: DC
  Filled 2014-03-30: qty 10

## 2014-03-30 MED ORDER — CHLORHEXIDINE GLUCONATE 4 % EX LIQD
60.0000 mL | Freq: Once | CUTANEOUS | Status: AC
  Administered 2014-03-31: 4 via TOPICAL

## 2014-03-30 NOTE — Progress Notes (Signed)
Subjective:  No shortness of breath or chest pain  Objective:  Vital Signs in the last 24 hours: BP 122/59  Pulse 60  Temp(Src) 97.8 F (36.6 C) (Oral)  Resp 18  Ht 5\' 8"  (1.727 m)  Wt 108.863 kg (240 lb)  BMI 36.50 kg/m2  SpO2 98%  Physical Exam: Obese male in no acute distress Lungs:  Clear Cardiac:  Regular rhythm, normal S1 and S2, no S3 Extremities: Radial cath site clean and dry  Intake/Output from previous day: 08/01 0701 - 08/02 0700 In: 240 [P.O.:240] Out: -   Weight Filed Weights   03/28/14 0514 03/29/14 1500 03/30/14 0500  Weight: 113.7 kg (250 lb 10.6 oz) 110 kg (242 lb 8.1 oz) 108.863 kg (240 lb)    Lab Results: Basic Metabolic Panel:  Recent Labs  03/28/14 0130 03/30/14 0435  NA 140 137  K 4.4 4.2  CL 104 99  CO2 27 27  GLUCOSE 101* 113*  BUN 13 12  CREATININE 0.73 0.71   CBC:  Recent Labs  03/28/14 0130 03/30/14 0435  WBC 3.5* 4.6  HGB 9.0* 9.6*  HCT 28.9* 31.3*  MCV 87.6 86.9  PLT 118* 108*   Cardiac Enzymes:  Recent Labs  03/27/14 1940 03/28/14 0118 03/28/14 0640  TROPONINI <0.30 <0.30 <0.30    Telemetry: Sinus rhythm  Assessment/Plan:  1. Severe left main coronary artery disease and moderate right coronary artery disease, patent stent 2. History of cirrhosis with GI bleeding 3. Obesity  Recommendations:  Plans are for surgery in the morning. He appears stable for surgery. Questions were answered and he is willing to proceed.     Kerry Hough  MD [facility] Card[facility]  03/30/2014, 9:38 AM

## 2014-03-31 ENCOUNTER — Encounter (HOSPITAL_COMMUNITY): Payer: Medicare Other | Admitting: Anesthesiology

## 2014-03-31 ENCOUNTER — Inpatient Hospital Stay (HOSPITAL_COMMUNITY): Payer: Medicare Other

## 2014-03-31 ENCOUNTER — Inpatient Hospital Stay (HOSPITAL_COMMUNITY): Payer: Medicare Other | Admitting: Anesthesiology

## 2014-03-31 ENCOUNTER — Encounter (HOSPITAL_COMMUNITY)
Admission: EM | Disposition: A | Discharge status: Home / Self Care | Payer: Medicare Other | Source: Home / Self Care | Attending: Thoracic Surgery (Cardiothoracic Vascular Surgery)

## 2014-03-31 DIAGNOSIS — Z951 Presence of aortocoronary bypass graft: Secondary | ICD-10-CM

## 2014-03-31 DIAGNOSIS — I2510 Atherosclerotic heart disease of native coronary artery without angina pectoris: Secondary | ICD-10-CM

## 2014-03-31 HISTORY — PX: INTRAOPERATIVE TRANSESOPHAGEAL ECHOCARDIOGRAM: SHX5062

## 2014-03-31 HISTORY — PX: CORONARY ARTERY BYPASS GRAFT: SHX141

## 2014-03-31 LAB — POCT I-STAT 3, ART BLOOD GAS (G3+)
Acid-Base Excess: 3 mmol/L — ABNORMAL HIGH (ref 0.0–2.0)
Acid-base deficit: 1 mmol/L (ref 0.0–2.0)
Acid-base deficit: 1 mmol/L (ref 0.0–2.0)
Acid-base deficit: 1 mmol/L (ref 0.0–2.0)
BICARBONATE: 24.8 meq/L — AB (ref 20.0–24.0)
BICARBONATE: 25.2 meq/L — AB (ref 20.0–24.0)
Bicarbonate: 27.2 mEq/L — ABNORMAL HIGH (ref 20.0–24.0)
Bicarbonate: 25.4 mEq/L — ABNORMAL HIGH (ref 20.0–24.0)
Bicarbonate: 25.3 mEq/L — ABNORMAL HIGH (ref 20.0–24.0)
O2 SAT: 100 %
O2 SAT: 99 %
O2 Saturation: 100 %
O2 Saturation: 94 %
O2 Saturation: 98 %
PCO2 ART: 48 mmHg — AB (ref 35.0–45.0)
PH ART: 7.359 (ref 7.350–7.450)
PO2 ART: 131 mmHg — AB (ref 80.0–100.0)
PO2 ART: 111 mmHg — AB (ref 80.0–100.0)
Patient temperature: 37.7
Patient temperature: 37.7
TCO2: 28 mmol/L (ref 0–100)
TCO2: 26 mmol/L (ref 0–100)
TCO2: 27 mmol/L (ref 0–100)
TCO2: 27 mmol/L (ref 0–100)
TCO2: 27 mmol/L (ref 0–100)
pCO2 arterial: 41.7 mmHg (ref 35.0–45.0)
pCO2 arterial: 44.1 mmHg (ref 35.0–45.0)
pCO2 arterial: 46.8 mmHg — ABNORMAL HIGH (ref 35.0–45.0)
pCO2 arterial: 45.2 mmHg — ABNORMAL HIGH (ref 35.0–45.0)
pH, Arterial: 7.423 (ref 7.350–7.450)
pH, Arterial: 7.341 — ABNORMAL LOW (ref 7.350–7.450)
pH, Arterial: 7.359 (ref 7.350–7.450)
pH, Arterial: 7.332 — ABNORMAL LOW (ref 7.350–7.450)
pO2, Arterial: 317 mmHg — ABNORMAL HIGH (ref 80.0–100.0)
pO2, Arterial: 281 mmHg — ABNORMAL HIGH (ref 80.0–100.0)
pO2, Arterial: 77 mmHg — ABNORMAL LOW (ref 80.0–100.0)

## 2014-03-31 LAB — POCT I-STAT, CHEM 8
BUN: 13 mg/dL (ref 6–23)
BUN: 12 mg/dL (ref 6–23)
BUN: 10 mg/dL (ref 6–23)
BUN: 12 mg/dL (ref 6–23)
BUN: 12 mg/dL (ref 6–23)
BUN: 11 mg/dL (ref 6–23)
CALCIUM ION: 1.03 mmol/L — AB (ref 1.13–1.30)
CALCIUM ION: 1.08 mmol/L — AB (ref 1.13–1.30)
CHLORIDE: 100 meq/L (ref 96–112)
CHLORIDE: 100 meq/L (ref 96–112)
CHLORIDE: 101 meq/L (ref 96–112)
CHLORIDE: 104 meq/L (ref 96–112)
CREATININE: 0.6 mg/dL (ref 0.50–1.35)
CREATININE: 0.6 mg/dL (ref 0.50–1.35)
Calcium, Ion: 1.19 mmol/L (ref 1.13–1.30)
Calcium, Ion: 1.24 mmol/L (ref 1.13–1.30)
Calcium, Ion: 1.06 mmol/L — ABNORMAL LOW (ref 1.13–1.30)
Calcium, Ion: 1.11 mmol/L — ABNORMAL LOW (ref 1.13–1.30)
Chloride: 98 mEq/L (ref 96–112)
Chloride: 103 mEq/L (ref 96–112)
Creatinine, Ser: 0.6 mg/dL (ref 0.50–1.35)
Creatinine, Ser: 0.7 mg/dL (ref 0.50–1.35)
Creatinine, Ser: 0.6 mg/dL (ref 0.50–1.35)
Creatinine, Ser: 0.7 mg/dL (ref 0.50–1.35)
GLUCOSE: 184 mg/dL — AB (ref 70–99)
GLUCOSE: 121 mg/dL — AB (ref 70–99)
Glucose, Bld: 132 mg/dL — ABNORMAL HIGH (ref 70–99)
Glucose, Bld: 118 mg/dL — ABNORMAL HIGH (ref 70–99)
Glucose, Bld: 119 mg/dL — ABNORMAL HIGH (ref 70–99)
Glucose, Bld: 205 mg/dL — ABNORMAL HIGH (ref 70–99)
HCT: 30 % — ABNORMAL LOW (ref 39.0–52.0)
HCT: 24 % — ABNORMAL LOW (ref 39.0–52.0)
HCT: 25 % — ABNORMAL LOW (ref 39.0–52.0)
HCT: 25 % — ABNORMAL LOW (ref 39.0–52.0)
HEMATOCRIT: 29 % — AB (ref 39.0–52.0)
HEMATOCRIT: 30 % — AB (ref 39.0–52.0)
HEMOGLOBIN: 10.2 g/dL — AB (ref 13.0–17.0)
Hemoglobin: 10.2 g/dL — ABNORMAL LOW (ref 13.0–17.0)
Hemoglobin: 9.9 g/dL — ABNORMAL LOW (ref 13.0–17.0)
Hemoglobin: 8.2 g/dL — ABNORMAL LOW (ref 13.0–17.0)
Hemoglobin: 8.5 g/dL — ABNORMAL LOW (ref 13.0–17.0)
Hemoglobin: 8.5 g/dL — ABNORMAL LOW (ref 13.0–17.0)
POTASSIUM: 5.1 meq/L (ref 3.7–5.3)
POTASSIUM: 4.4 meq/L (ref 3.7–5.3)
Potassium: 4 mEq/L (ref 3.7–5.3)
Potassium: 4 mEq/L (ref 3.7–5.3)
Potassium: 3.7 mEq/L (ref 3.7–5.3)
Potassium: 5.9 mEq/L — ABNORMAL HIGH (ref 3.7–5.3)
SODIUM: 140 meq/L (ref 137–147)
Sodium: 138 mEq/L (ref 137–147)
Sodium: 138 mEq/L (ref 137–147)
Sodium: 136 mEq/L — ABNORMAL LOW (ref 137–147)
Sodium: 135 mEq/L — ABNORMAL LOW (ref 137–147)
Sodium: 137 mEq/L (ref 137–147)
TCO2: 27 mmol/L (ref 0–100)
TCO2: 28 mmol/L (ref 0–100)
TCO2: 25 mmol/L (ref 0–100)
TCO2: 25 mmol/L (ref 0–100)
TCO2: 23 mmol/L (ref 0–100)
TCO2: 22 mmol/L (ref 0–100)

## 2014-03-31 LAB — PULMONARY FUNCTION TEST
FEF 25-75 POST: 2.36 L/s
FEF 25-75 PRE: 1.95 L/s
FEF2575-%CHANGE-POST: 20 %
FEF2575-%PRED-POST: 98 %
FEF2575-%Pred-Pre: 81 %
FEV1-%Change-Post: 2 %
FEV1-%PRED-POST: 81 %
FEV1-%Pred-Pre: 79 %
FEV1-PRE: 2.44 L
FEV1-Post: 2.5 L
FEV1FVC-%Change-Post: -2 %
FEV1FVC-%PRED-PRE: 105 %
FEV6-%Change-Post: 4 %
FEV6-%PRED-POST: 82 %
FEV6-%Pred-Pre: 78 %
FEV6-Post: 3.25 L
FEV6-Pre: 3.1 L
FEV6FVC-%CHANGE-POST: 0 %
FEV6FVC-%Pred-Post: 106 %
FEV6FVC-%Pred-Pre: 106 %
FVC-%CHANGE-POST: 4 %
FVC-%Pred-Post: 78 %
FVC-%Pred-Pre: 74 %
FVC-Post: 3.26 L
FVC-Pre: 3.11 L
PRE FEV1/FVC RATIO: 78 %
Post FEV1/FVC ratio: 77 %
Post FEV6/FVC ratio: 100 %
Pre FEV6/FVC Ratio: 100 %

## 2014-03-31 LAB — CBC
HEMATOCRIT: 30.6 % — AB (ref 39.0–52.0)
HEMATOCRIT: 30.2 % — AB (ref 39.0–52.0)
HEMOGLOBIN: 9.4 g/dL — AB (ref 13.0–17.0)
Hemoglobin: 9.2 g/dL — ABNORMAL LOW (ref 13.0–17.0)
MCH: 27.1 pg (ref 26.0–34.0)
MCH: 27.5 pg (ref 26.0–34.0)
MCHC: 30.7 g/dL (ref 30.0–36.0)
MCHC: 30.5 g/dL (ref 30.0–36.0)
MCV: 88.2 fL (ref 78.0–100.0)
MCV: 90.1 fL (ref 78.0–100.0)
PLATELETS: 132 10*3/uL — AB (ref 150–400)
Platelets: 149 10*3/uL — ABNORMAL LOW (ref 150–400)
RBC: 3.47 MIL/uL — ABNORMAL LOW (ref 4.22–5.81)
RBC: 3.35 MIL/uL — ABNORMAL LOW (ref 4.22–5.81)
RDW: 16.3 % — ABNORMAL HIGH (ref 11.5–15.5)
RDW: 16.4 % — AB (ref 11.5–15.5)
WBC: 17 10*3/uL — ABNORMAL HIGH (ref 4.0–10.5)
WBC: 10.6 10*3/uL — AB (ref 4.0–10.5)

## 2014-03-31 LAB — APTT: aPTT: 29 seconds (ref 24–37)

## 2014-03-31 LAB — PROTIME-INR
INR: 1.46 (ref 0.00–1.49)
Prothrombin Time: 17.7 seconds — ABNORMAL HIGH (ref 11.6–15.2)

## 2014-03-31 LAB — SURGICAL PCR SCREEN
MRSA, PCR: NEGATIVE
STAPHYLOCOCCUS AUREUS: NEGATIVE

## 2014-03-31 LAB — POCT I-STAT 4, (NA,K, GLUC, HGB,HCT)
GLUCOSE: 153 mg/dL — AB (ref 70–99)
HCT: 33 % — ABNORMAL LOW (ref 39.0–52.0)
Hemoglobin: 11.2 g/dL — ABNORMAL LOW (ref 13.0–17.0)
POTASSIUM: 4.2 meq/L (ref 3.7–5.3)
Sodium: 139 mEq/L (ref 137–147)

## 2014-03-31 LAB — CREATININE, SERUM
CREATININE: 0.68 mg/dL (ref 0.50–1.35)
GFR calc Af Amer: >90 mL/min (ref >90)
GFR calc non Af Amer: >90 mL/min (ref >90)

## 2014-03-31 LAB — MAGNESIUM: MAGNESIUM: 2.6 mg/dL — AB (ref 1.5–2.5)

## 2014-03-31 LAB — PREPARE RBC (CROSSMATCH)

## 2014-03-31 LAB — HEMOGLOBIN AND HEMATOCRIT, BLOOD
HCT: 23.9 % — ABNORMAL LOW (ref 39.0–52.0)
Hemoglobin: 7.5 g/dL — ABNORMAL LOW (ref 13.0–17.0)

## 2014-03-31 LAB — PLATELET COUNT: Platelets: 147 10*3/uL — ABNORMAL LOW (ref 150–400)

## 2014-03-31 SURGERY — CORONARY ARTERY BYPASS GRAFTING (CABG)
Anesthesia: General | Site: Chest

## 2014-03-31 MED ORDER — ROCURONIUM BROMIDE 50 MG/5ML IV SOLN
INTRAVENOUS | Status: AC
  Filled 2014-03-31: qty 2

## 2014-03-31 MED ORDER — LACTATED RINGERS IV SOLN
INTRAVENOUS | Status: DC | PRN
  Administered 2014-03-31 (×2): via INTRAVENOUS

## 2014-03-31 MED ORDER — ATORVASTATIN CALCIUM 80 MG PO TABS
80.0000 mg | ORAL_TABLET | Freq: Every day | ORAL | Status: DC
  Administered 2014-04-01 – 2014-04-04 (×4): 80 mg via ORAL
  Filled 2014-03-31 (×5): qty 1

## 2014-03-31 MED ORDER — MORPHINE SULFATE 2 MG/ML IJ SOLN
2.0000 mg | INTRAMUSCULAR | Status: DC | PRN
  Administered 2014-04-01: 4 mg via INTRAVENOUS
  Administered 2014-04-01: 2 mg via INTRAVENOUS
  Administered 2014-04-01: 4 mg via INTRAVENOUS
  Filled 2014-03-31: qty 2
  Filled 2014-03-31: qty 1
  Filled 2014-03-31 (×2): qty 2

## 2014-03-31 MED ORDER — MIDAZOLAM HCL 2 MG/2ML IJ SOLN: 2.0000 mg | INTRAMUSCULAR | Status: DC | PRN

## 2014-03-31 MED ORDER — DEXTROSE 5 % IV SOLN
0.0000 ug/min | INTRAVENOUS | Status: DC
  Administered 2014-03-31: 50 ug/min via INTRAVENOUS
  Filled 2014-03-31 (×3): qty 2

## 2014-03-31 MED ORDER — FAMOTIDINE IN NACL 20-0.9 MG/50ML-% IV SOLN: 20.0000 mg | Freq: Two times a day (BID) | INTRAVENOUS | Status: DC

## 2014-03-31 MED ORDER — MIDAZOLAM HCL 5 MG/5ML IJ SOLN
INTRAMUSCULAR | Status: DC | PRN
  Administered 2014-03-31: 6 mg via INTRAVENOUS
  Administered 2014-03-31 (×4): 2 mg via INTRAVENOUS
  Administered 2014-03-31: 4 mg via INTRAVENOUS

## 2014-03-31 MED ORDER — METOPROLOL TARTRATE 12.5 MG HALF TABLET
12.5000 mg | ORAL_TABLET | Freq: Two times a day (BID) | ORAL | Status: DC
  Administered 2014-04-01 – 2014-04-04 (×7): 12.5 mg via ORAL
  Filled 2014-03-31 (×11): qty 1

## 2014-03-31 MED ORDER — MIDAZOLAM HCL 2 MG/2ML IJ SOLN
INTRAMUSCULAR | Status: AC
  Filled 2014-03-31: qty 2

## 2014-03-31 MED ORDER — OXYCODONE HCL 5 MG PO TABS
5.0000 mg | ORAL_TABLET | ORAL | Status: DC | PRN
  Administered 2014-04-01 – 2014-04-05 (×8): 10 mg via ORAL
  Filled 2014-03-31 (×6): qty 2
  Filled 2014-03-31: qty 1
  Filled 2014-03-31 (×3): qty 2

## 2014-03-31 MED ORDER — HEMOSTATIC AGENTS (NO CHARGE) OPTIME
TOPICAL | Status: DC | PRN
  Administered 2014-03-31: 1 via TOPICAL

## 2014-03-31 MED ORDER — VECURONIUM BROMIDE 10 MG IV SOLR
INTRAVENOUS | Status: AC
  Filled 2014-03-31: qty 10

## 2014-03-31 MED ORDER — CETYLPYRIDINIUM CHLORIDE 0.05 % MT LIQD: 7.0000 mL | Freq: Four times a day (QID) | OROMUCOSAL | Status: DC

## 2014-03-31 MED ORDER — SODIUM CHLORIDE 0.9 % IJ SOLN
INTRAMUSCULAR | Status: AC
  Filled 2014-03-31: qty 20

## 2014-03-31 MED ORDER — MAGNESIUM SULFATE 4000MG/100ML IJ SOLN
4.0000 g | Freq: Once | INTRAMUSCULAR | Status: AC
  Administered 2014-03-31: 4 g via INTRAVENOUS

## 2014-03-31 MED ORDER — PROTAMINE SULFATE 10 MG/ML IV SOLN
INTRAVENOUS | Status: DC | PRN
  Administered 2014-03-31: 250 mg via INTRAVENOUS

## 2014-03-31 MED ORDER — ACETAMINOPHEN 160 MG/5ML PO SOLN: 650.0000 mg | Freq: Once | ORAL | Status: AC

## 2014-03-31 MED ORDER — PHENYLEPHRINE HCL 10 MG/ML IJ SOLN
INTRAMUSCULAR | Status: DC | PRN
  Administered 2014-03-31 (×3): 80 ug via INTRAVENOUS
  Administered 2014-03-31: 40 ug via INTRAVENOUS

## 2014-03-31 MED ORDER — METOPROLOL TARTRATE 25 MG/10 ML ORAL SUSPENSION
12.5000 mg | Freq: Two times a day (BID) | ORAL | Status: DC
  Filled 2014-03-31 (×11): qty 5

## 2014-03-31 MED ORDER — SODIUM CHLORIDE 0.9 % IV SOLN
INTRAVENOUS | Status: DC
  Administered 2014-03-31: 13:00:00 via INTRAVENOUS
  Administered 2014-04-02: 1 mL via INTRAVENOUS

## 2014-03-31 MED ORDER — 0.9 % SODIUM CHLORIDE (POUR BTL) OPTIME
TOPICAL | Status: DC | PRN
  Administered 2014-03-31: 1000 mL

## 2014-03-31 MED ORDER — CHLORHEXIDINE GLUCONATE 0.12 % MT SOLN: 15.0000 mL | Freq: Two times a day (BID) | OROMUCOSAL | Status: DC

## 2014-03-31 MED ORDER — LACTATED RINGERS IV SOLN
INTRAVENOUS | Status: DC
  Administered 2014-03-31: 13:00:00 via INTRAVENOUS

## 2014-03-31 MED ORDER — EPHEDRINE SULFATE 50 MG/ML IJ SOLN
INTRAMUSCULAR | Status: AC
  Filled 2014-03-31: qty 1

## 2014-03-31 MED ORDER — SODIUM CHLORIDE 0.9 % IJ SOLN: 3.0000 mL | INTRAMUSCULAR | Status: DC | PRN

## 2014-03-31 MED ORDER — ACETAMINOPHEN 160 MG/5ML PO SOLN
1000.0000 mg | Freq: Four times a day (QID) | ORAL | Status: DC
  Filled 2014-03-31: qty 40

## 2014-03-31 MED ORDER — ROCURONIUM BROMIDE 50 MG/5ML IV SOLN
INTRAVENOUS | Status: AC
  Filled 2014-03-31: qty 1

## 2014-03-31 MED ORDER — BISACODYL 5 MG PO TBEC
10.0000 mg | DELAYED_RELEASE_TABLET | Freq: Every day | ORAL | Status: DC
  Administered 2014-04-01 – 2014-04-04 (×4): 10 mg via ORAL
  Filled 2014-03-31 (×4): qty 2

## 2014-03-31 MED ORDER — PANTOPRAZOLE SODIUM 40 MG PO TBEC
40.0000 mg | DELAYED_RELEASE_TABLET | Freq: Every day | ORAL | Status: DC
  Administered 2014-04-02 – 2014-04-04 (×3): 40 mg via ORAL
  Filled 2014-03-31 (×3): qty 1

## 2014-03-31 MED ORDER — FENTANYL CITRATE 0.05 MG/ML IJ SOLN
INTRAMUSCULAR | Status: AC
  Filled 2014-03-31: qty 5

## 2014-03-31 MED ORDER — MIDAZOLAM HCL 10 MG/2ML IJ SOLN
INTRAMUSCULAR | Status: AC
  Filled 2014-03-31: qty 2

## 2014-03-31 MED ORDER — POTASSIUM CHLORIDE 10 MEQ/50ML IV SOLN: 10.0000 meq | INTRAVENOUS | Status: AC

## 2014-03-31 MED ORDER — METOPROLOL TARTRATE 1 MG/ML IV SOLN: 2.5000 mg | INTRAVENOUS | Status: DC | PRN

## 2014-03-31 MED ORDER — SODIUM CHLORIDE 0.9 % IV SOLN
INTRAVENOUS | Status: DC
  Filled 2014-03-31 (×2): qty 1

## 2014-03-31 MED ORDER — PHENYLEPHRINE HCL 10 MG/ML IJ SOLN
20.0000 mg | INTRAVENOUS | Status: DC | PRN
  Administered 2014-03-31: 20 ug/min via INTRAVENOUS

## 2014-03-31 MED ORDER — SUCCINYLCHOLINE CHLORIDE 20 MG/ML IJ SOLN
INTRAMUSCULAR | Status: AC
  Filled 2014-03-31: qty 1

## 2014-03-31 MED ORDER — NITROGLYCERIN IN D5W 200-5 MCG/ML-% IV SOLN: 0.0000 ug/min | INTRAVENOUS | Status: DC

## 2014-03-31 MED ORDER — BISACODYL 10 MG RE SUPP: 10.0000 mg | Freq: Every day | RECTAL | Status: DC

## 2014-03-31 MED ORDER — HEPARIN SODIUM (PORCINE) 1000 UNIT/ML IJ SOLN
INTRAMUSCULAR | Status: DC | PRN
  Administered 2014-03-31: 5000 [IU] via INTRAVENOUS
  Administered 2014-03-31: 25000 [IU] via INTRAVENOUS

## 2014-03-31 MED ORDER — MAGNESIUM SULFATE 40 MG/ML IJ SOLN
INTRAMUSCULAR | Status: AC
  Administered 2014-03-31: 4 g via INTRAVENOUS
  Filled 2014-03-31: qty 100

## 2014-03-31 MED ORDER — LACTATED RINGERS IV SOLN: 500.0000 mL | Freq: Once | INTRAVENOUS | Status: AC | PRN

## 2014-03-31 MED ORDER — MORPHINE SULFATE 2 MG/ML IJ SOLN
1.0000 mg | INTRAMUSCULAR | Status: AC | PRN
  Administered 2014-03-31: 2 mg via INTRAVENOUS
  Administered 2014-03-31: 4 mg via INTRAVENOUS
  Administered 2014-03-31: 2 mg via INTRAVENOUS
  Filled 2014-03-31 (×3): qty 2

## 2014-03-31 MED ORDER — EPHEDRINE SULFATE 50 MG/ML IJ SOLN
INTRAMUSCULAR | Status: DC | PRN
  Administered 2014-03-31: 5 mg via INTRAVENOUS

## 2014-03-31 MED ORDER — ARTIFICIAL TEARS OP OINT
TOPICAL_OINTMENT | OPHTHALMIC | Status: AC
  Filled 2014-03-31: qty 3.5

## 2014-03-31 MED ORDER — PROPOFOL 10 MG/ML IV BOLUS
INTRAVENOUS | Status: AC
  Filled 2014-03-31: qty 20

## 2014-03-31 MED ORDER — ASPIRIN 81 MG PO CHEW: 324.0000 mg | CHEWABLE_TABLET | Freq: Every day | ORAL | Status: DC

## 2014-03-31 MED ORDER — CETYLPYRIDINIUM CHLORIDE 0.05 % MT LIQD
7.0000 mL | Freq: Four times a day (QID) | OROMUCOSAL | Status: DC
  Administered 2014-03-31 (×3): 7 mL via OROMUCOSAL

## 2014-03-31 MED ORDER — ALBUMIN HUMAN 5 % IV SOLN
INTRAVENOUS | Status: DC | PRN
  Administered 2014-03-31: 12:00:00 via INTRAVENOUS

## 2014-03-31 MED ORDER — PHENYLEPHRINE 40 MCG/ML (10ML) SYRINGE FOR IV PUSH (FOR BLOOD PRESSURE SUPPORT)
PREFILLED_SYRINGE | INTRAVENOUS | Status: AC
  Filled 2014-03-31: qty 10

## 2014-03-31 MED ORDER — SODIUM CHLORIDE 0.9 % IJ SOLN
3.0000 mL | Freq: Two times a day (BID) | INTRAMUSCULAR | Status: DC
  Administered 2014-04-01 – 2014-04-03 (×5): 3 mL via INTRAVENOUS

## 2014-03-31 MED ORDER — DEXMEDETOMIDINE HCL IN NACL 200 MCG/50ML IV SOLN
0.1000 ug/kg/h | INTRAVENOUS | Status: DC
  Filled 2014-03-31: qty 50

## 2014-03-31 MED ORDER — FENTANYL CITRATE 0.05 MG/ML IJ SOLN
INTRAMUSCULAR | Status: AC
  Filled 2014-03-31: qty 2

## 2014-03-31 MED ORDER — SODIUM CHLORIDE 0.45 % IV SOLN
INTRAVENOUS | Status: DC
  Administered 2014-03-31: 13:00:00 via INTRAVENOUS

## 2014-03-31 MED ORDER — PROPOFOL 10 MG/ML IV BOLUS
INTRAVENOUS | Status: DC | PRN
  Administered 2014-03-31: 80 mg via INTRAVENOUS

## 2014-03-31 MED ORDER — VECURONIUM BROMIDE 10 MG IV SOLR
INTRAVENOUS | Status: DC | PRN
  Administered 2014-03-31: 10 mg via INTRAVENOUS

## 2014-03-31 MED ORDER — SODIUM CHLORIDE 0.9 % IV SOLN
10.0000 g | INTRAVENOUS | Status: DC | PRN
  Administered 2014-03-31: 5 g/h via INTRAVENOUS

## 2014-03-31 MED ORDER — DEXTROSE 5 % IV SOLN
1.5000 g | Freq: Two times a day (BID) | INTRAVENOUS | Status: AC
  Administered 2014-03-31 – 2014-04-02 (×4): 1.5 g via INTRAVENOUS
  Filled 2014-03-31 (×4): qty 1.5

## 2014-03-31 MED ORDER — CHLORHEXIDINE GLUCONATE 0.12 % MT SOLN
15.0000 mL | Freq: Two times a day (BID) | OROMUCOSAL | Status: DC
  Administered 2014-03-31 – 2014-04-01 (×3): 15 mL via OROMUCOSAL
  Filled 2014-03-31 (×2): qty 15

## 2014-03-31 MED ORDER — VANCOMYCIN HCL IN DEXTROSE 1-5 GM/200ML-% IV SOLN
1000.0000 mg | Freq: Once | INTRAVENOUS | Status: AC
  Administered 2014-03-31: 1000 mg via INTRAVENOUS
  Filled 2014-03-31: qty 200

## 2014-03-31 MED ORDER — DOCUSATE SODIUM 100 MG PO CAPS
200.0000 mg | ORAL_CAPSULE | Freq: Every day | ORAL | Status: DC
  Administered 2014-04-01 – 2014-04-04 (×4): 200 mg via ORAL
  Filled 2014-03-31 (×5): qty 2

## 2014-03-31 MED ORDER — HEPARIN SODIUM (PORCINE) 1000 UNIT/ML IJ SOLN
INTRAMUSCULAR | Status: AC
  Filled 2014-03-31: qty 1

## 2014-03-31 MED ORDER — ASPIRIN EC 325 MG PO TBEC
325.0000 mg | DELAYED_RELEASE_TABLET | Freq: Every day | ORAL | Status: DC
  Administered 2014-04-01 – 2014-04-04 (×4): 325 mg via ORAL
  Filled 2014-03-31 (×5): qty 1

## 2014-03-31 MED ORDER — ARTIFICIAL TEARS OP OINT
TOPICAL_OINTMENT | OPHTHALMIC | Status: DC | PRN
  Administered 2014-03-31: 1 via OPHTHALMIC

## 2014-03-31 MED ORDER — ACETAMINOPHEN 650 MG RE SUPP
650.0000 mg | Freq: Once | RECTAL | Status: AC
  Administered 2014-03-31: 650 mg via RECTAL

## 2014-03-31 MED ORDER — FENTANYL CITRATE 0.05 MG/ML IJ SOLN
INTRAMUSCULAR | Status: DC | PRN
  Administered 2014-03-31 (×2): 250 ug via INTRAVENOUS
  Administered 2014-03-31: 400 ug via INTRAVENOUS
  Administered 2014-03-31: 50 ug via INTRAVENOUS
  Administered 2014-03-31 (×2): 250 ug via INTRAVENOUS
  Administered 2014-03-31: 50 ug via INTRAVENOUS
  Administered 2014-03-31: 100 ug via INTRAVENOUS

## 2014-03-31 MED ORDER — ROCURONIUM BROMIDE 100 MG/10ML IV SOLN
INTRAVENOUS | Status: DC | PRN
  Administered 2014-03-31 (×4): 50 mg via INTRAVENOUS

## 2014-03-31 MED ORDER — ACETAMINOPHEN 500 MG PO TABS
1000.0000 mg | ORAL_TABLET | Freq: Four times a day (QID) | ORAL | Status: DC
  Administered 2014-04-01 – 2014-04-05 (×16): 1000 mg via ORAL
  Filled 2014-03-31 (×20): qty 2

## 2014-03-31 MED ORDER — ONDANSETRON HCL 4 MG/2ML IJ SOLN
4.0000 mg | Freq: Four times a day (QID) | INTRAMUSCULAR | Status: DC | PRN
  Administered 2014-04-01 – 2014-04-04 (×6): 4 mg via INTRAVENOUS
  Filled 2014-03-31 (×6): qty 2

## 2014-03-31 MED ORDER — SODIUM CHLORIDE 0.9 % IV SOLN
250.0000 mL | INTRAVENOUS | Status: DC
  Administered 2014-04-01: 250 mL via INTRAVENOUS

## 2014-03-31 MED ORDER — ALBUTEROL SULFATE (2.5 MG/3ML) 0.083% IN NEBU
2.5000 mg | INHALATION_SOLUTION | Freq: Four times a day (QID) | RESPIRATORY_TRACT | Status: DC | PRN
  Administered 2014-03-31: 2.5 mg via RESPIRATORY_TRACT
  Filled 2014-03-31: qty 3

## 2014-03-31 MED ORDER — ALBUMIN HUMAN 5 % IV SOLN
250.0000 mL | INTRAVENOUS | Status: AC | PRN
  Administered 2014-03-31 (×3): 250 mL via INTRAVENOUS
  Filled 2014-03-31: qty 250

## 2014-03-31 MED ORDER — INSULIN REGULAR BOLUS VIA INFUSION
0.0000 [IU] | Freq: Three times a day (TID) | INTRAVENOUS | Status: DC
  Administered 2014-04-01: 4 [IU] via INTRAVENOUS
  Filled 2014-03-31: qty 10

## 2014-03-31 MED ORDER — LIDOCAINE HCL (CARDIAC) 20 MG/ML IV SOLN
INTRAVENOUS | Status: AC
  Filled 2014-03-31: qty 5

## 2014-03-31 MED ORDER — PROTAMINE SULFATE 10 MG/ML IV SOLN
INTRAVENOUS | Status: AC
  Filled 2014-03-31: qty 25

## 2014-03-31 SURGICAL SUPPLY — 91 items
ATTRACTOMAT 16X20 MAGNETIC DRP (DRAPES) ×3 IMPLANT
BAG DECANTER FOR FLEXI CONT (MISCELLANEOUS) ×3 IMPLANT
BANDAGE ELASTIC 4 VELCRO ST LF (GAUZE/BANDAGES/DRESSINGS) ×3 IMPLANT
BANDAGE ELASTIC 6 VELCRO ST LF (GAUZE/BANDAGES/DRESSINGS) ×3 IMPLANT
BANDAGE GAUZE 4  KLING STR (GAUZE/BANDAGES/DRESSINGS) ×3 IMPLANT
BASKET HEART (ORDER IN 25'S) (MISCELLANEOUS) ×1
BASKET HEART (ORDER IN 25S) (MISCELLANEOUS) ×2 IMPLANT
BLADE STERNUM SYSTEM 6 (BLADE) ×3 IMPLANT
BNDG GAUZE ELAST 4 BULKY (GAUZE/BANDAGES/DRESSINGS) ×3 IMPLANT
CANISTER SUCTION 2500CC (MISCELLANEOUS) ×3 IMPLANT
CANNULA EZ GLIDE AORTIC 21FR (CANNULA) ×3 IMPLANT
CARDIAC SUCTION (MISCELLANEOUS) ×3 IMPLANT
CATH CPB KIT HENDRICKSON (MISCELLANEOUS) ×3 IMPLANT
CATH ROBINSON RED A/P 18FR (CATHETERS) ×3 IMPLANT
CATH THORACIC 36FR (CATHETERS) ×3 IMPLANT
CATH THORACIC 36FR RT ANG (CATHETERS) ×3 IMPLANT
CLIP TI MEDIUM 24 (CLIP) IMPLANT
CLIP TI WIDE RED SMALL 24 (CLIP) ×3 IMPLANT
COVER SURGICAL LIGHT HANDLE (MISCELLANEOUS) ×3 IMPLANT
CRADLE DONUT ADULT HEAD (MISCELLANEOUS) ×3 IMPLANT
DERMABOND ADVANCED (GAUZE/BANDAGES/DRESSINGS) ×1
DERMABOND ADVANCED .7 DNX12 (GAUZE/BANDAGES/DRESSINGS) ×2 IMPLANT
DRAPE CARDIOVASCULAR INCISE (DRAPES) ×1
DRAPE SLUSH/WARMER DISC (DRAPES) ×3 IMPLANT
DRAPE SRG 135X102X78XABS (DRAPES) ×2 IMPLANT
DRSG COVADERM 4X14 (GAUZE/BANDAGES/DRESSINGS) ×3 IMPLANT
ELECT REM PT RETURN 9FT ADLT (ELECTROSURGICAL) ×6
ELECTRODE REM PT RTRN 9FT ADLT (ELECTROSURGICAL) ×4 IMPLANT
GAUZE SPONGE 4X4 12PLY STRL (GAUZE/BANDAGES/DRESSINGS) ×6 IMPLANT
GAUZE SPONGE 4X4 16PLY XRAY LF (GAUZE/BANDAGES/DRESSINGS) ×6 IMPLANT
GLOVE BIO SURGEON STRL SZ 6 (GLOVE) ×6 IMPLANT
GLOVE BIO SURGEON STRL SZ 6.5 (GLOVE) ×9 IMPLANT
GLOVE BIOGEL PI IND STRL 6 (GLOVE) ×12 IMPLANT
GLOVE BIOGEL PI INDICATOR 6 (GLOVE) ×6
GLOVE EUDERMIC 7 POWDERFREE (GLOVE) ×9 IMPLANT
GOWN STRL REUS W/ TWL LRG LVL3 (GOWN DISPOSABLE) ×8 IMPLANT
GOWN STRL REUS W/ TWL XL LVL3 (GOWN DISPOSABLE) ×4 IMPLANT
GOWN STRL REUS W/TWL LRG LVL3 (GOWN DISPOSABLE) ×4
GOWN STRL REUS W/TWL XL LVL3 (GOWN DISPOSABLE) ×2
HEMOSTAT POWDER SURGIFOAM 1G (HEMOSTASIS) ×9 IMPLANT
HEMOSTAT SURGICEL 2X14 (HEMOSTASIS) ×3 IMPLANT
INSERT FOGARTY XLG (MISCELLANEOUS) ×3 IMPLANT
KIT BASIN OR (CUSTOM PROCEDURE TRAY) ×3 IMPLANT
KIT ROOM TURNOVER OR (KITS) ×3 IMPLANT
KIT SUCTION CATH 14FR (SUCTIONS) ×6 IMPLANT
KIT VASOVIEW W/TROCAR VH 2000 (KITS) ×3 IMPLANT
MARKER GRAFT CORONARY BYPASS (MISCELLANEOUS) ×9 IMPLANT
NS IRRIG 1000ML POUR BTL (IV SOLUTION) ×15 IMPLANT
PACK OPEN HEART (CUSTOM PROCEDURE TRAY) ×3 IMPLANT
PAD ARMBOARD 7.5X6 YLW CONV (MISCELLANEOUS) ×6 IMPLANT
PAD ELECT DEFIB RADIOL ZOLL (MISCELLANEOUS) ×3 IMPLANT
PENCIL BUTTON HOLSTER BLD 10FT (ELECTRODE) ×3 IMPLANT
PUNCH AORTIC ROTATE 4.0MM (MISCELLANEOUS) IMPLANT
PUNCH AORTIC ROTATE 4.5MM 8IN (MISCELLANEOUS) ×3 IMPLANT
PUNCH AORTIC ROTATE 5MM 8IN (MISCELLANEOUS) IMPLANT
RELOAD ENDO GIA 45X2.5 (ENDOMECHANICALS) ×3 IMPLANT
SOLUTION ANTI FOG 6CC (MISCELLANEOUS) ×3 IMPLANT
SUT BONE WAX W31G (SUTURE) ×6 IMPLANT
SUT MNCRL AB 4-0 PS2 18 (SUTURE) IMPLANT
SUT PROLENE 3 0 SH DA (SUTURE) ×3 IMPLANT
SUT PROLENE 4 0 RB 1 (SUTURE) ×2
SUT PROLENE 4 0 SH DA (SUTURE) IMPLANT
SUT PROLENE 4-0 RB1 .5 CRCL 36 (SUTURE) ×4 IMPLANT
SUT PROLENE 6 0 C 1 30 (SUTURE) ×9 IMPLANT
SUT PROLENE 7 0 BV 1 (SUTURE) ×3 IMPLANT
SUT PROLENE 7 0 BV1 MDA (SUTURE) ×6 IMPLANT
SUT PROLENE 8 0 BV175 6 (SUTURE) IMPLANT
SUT SILK  1 MH (SUTURE) ×1
SUT SILK 1 MH (SUTURE) ×2 IMPLANT
SUT STEEL 6MS V (SUTURE) ×3 IMPLANT
SUT STEEL STERNAL CCS#1 18IN (SUTURE) IMPLANT
SUT STEEL SZ 6 DBL 3X14 BALL (SUTURE) ×3 IMPLANT
SUT VIC AB 1 CTX 36 (SUTURE) ×2
SUT VIC AB 1 CTX36XBRD ANBCTR (SUTURE) ×4 IMPLANT
SUT VIC AB 2-0 CT1 27 (SUTURE) ×1
SUT VIC AB 2-0 CT1 TAPERPNT 27 (SUTURE) ×2 IMPLANT
SUT VIC AB 2-0 CTX 27 (SUTURE) IMPLANT
SUT VIC AB 3-0 SH 27 (SUTURE)
SUT VIC AB 3-0 SH 27X BRD (SUTURE) IMPLANT
SUT VIC AB 3-0 X1 27 (SUTURE) ×3 IMPLANT
SUT VICRYL 4-0 PS2 18IN ABS (SUTURE) IMPLANT
SUTURE E-PAK OPEN HEART (SUTURE) ×3 IMPLANT
SYSTEM SAHARA CHEST DRAIN ATS (WOUND CARE) ×3 IMPLANT
TAPE CLOTH SURG 4X10 WHT LF (GAUZE/BANDAGES/DRESSINGS) ×3 IMPLANT
TOWEL OR 17X24 6PK STRL BLUE (TOWEL DISPOSABLE) ×6 IMPLANT
TOWEL OR 17X26 10 PK STRL BLUE (TOWEL DISPOSABLE) ×6 IMPLANT
TRAY FOLEY IC TEMP SENS 16FR (CATHETERS) ×3 IMPLANT
TUBE FEEDING 8FR 16IN STR KANG (MISCELLANEOUS) ×3 IMPLANT
TUBING INSUFFLATION 10FT LAP (TUBING) ×3 IMPLANT
UNDERPAD 30X30 INCONTINENT (UNDERPADS AND DIAPERS) ×3 IMPLANT
WATER STERILE IRR 1000ML POUR (IV SOLUTION) ×6 IMPLANT

## 2014-03-31 NOTE — Brief Op Note (Addendum)
      [address]       [place] [address]             [phone]     03/27/2014 - 03/31/2014  11:09 AM  PATIENT:  [name]  69 y.o. male  PRE-OPERATIVE DIAGNOSIS:  CAD LEFT MAIN DISEASE  POST-OPERATIVE DIAGNOSIS:  CAD LEFT MAIN DISEASE  PROCEDURE:  Procedure(s): CORONARY ARTERY BYPASS GRAFTING (CABG)X3 LIMA-LAD; SVG-OM; SVG-PD INTRAOPERATIVE TRANSESOPHAGEAL ECHOCARDIOGRAM [facility] RIGHT THIGH  SURGEON:  Surgeon(s): Melrose Nakayama, MD  PHYSICIAN ASSISTANT: WAYNE GOLD PA-C  ANESTHESIA:   general  PATIENT CONDITION:  ICU - intubated and hemodynamically stable.  PRE-OPERATIVE WEIGHT: 751.0CH  COMPLICATIONS: NO KNOWN  XC= 65 min CPB= 99 min  Good targets, good conduits

## 2014-03-31 NOTE — OR Nursing (Signed)
Second call made to SICU. Spoke with Tamela Oddi. [place]

## 2014-03-31 NOTE — Progress Notes (Signed)
Echocardiogram Echocardiogram Transesophageal has been performed.  [name] 03/31/2014, 8:02 AM

## 2014-03-31 NOTE — Interval H&P Note (Signed)
History and Physical Interval Note:  03/31/2014 7:16 AM  [name]  has presented today for surgery, with the diagnosis of CAD LEFT MAIN DISEASE  The various methods of treatment have been discussed with the patient and family. After consideration of risks, benefits and other options for treatment, the patient has consented to  Procedure(s): CORONARY ARTERY BYPASS GRAFTING (CABG) (N/A) INTRAOPERATIVE TRANSESOPHAGEAL ECHOCARDIOGRAM (N/A) as a surgical intervention .  The patient's history has been reviewed, patient examined, no change in status, stable for surgery.  I have reviewed the patient's chart and labs.  Questions were answered to the patient's satisfaction.     Tod Abrahamsen C

## 2014-03-31 NOTE — OR Nursing (Signed)
1st call to SICU 1137.

## 2014-03-31 NOTE — H&P (View-Only) (Signed)
Reason for Consult:Left main disease Referring Physician: Dr. Dot Lanes is an 68 y.o. male.  HPI: 68 yo man with a history of CAD presented with a cc/o chest pain  [name] is a 68 yo man with a history of hypertension, type II DM, hyperlipidemia, remote tobacco abuse and known CAD. He had a DES placed in the LAD in 05/2012. In June he was admitted with a GI bleed and his aspirin and effient were stopped at that time. Over the past month he has been experiencing exertional chest pain. He says this occurred just about every day. It would resolve with rest and/ or sublingual nitroglycerin. On the day of admission he had a severe episode while walking home from church. He took several NTG, but continued to feel poorly with associated dyspnea, sweating and belching. His symptoms resolved when he got to the ED. He ruled out for MI.   Yesterday he underwent cardiac catheterization which revealed an 80% left main stenosis. There was a 50% stenosis in the RCA.  He was diagnosed with cirrhosis for the first time during his last admission. He had an EGD, which showed portal gastropathy, barrett's esophagus, and chronic active H pylori.   He currently is pain free.    Past Medical History  Diagnosis Date  . Unspecified essential hypertension   . Coronary artery disease     a. 05/2012 Cath/PCI: LM 40ost, 50-60d, LAD 99p ruptured plaque (3.0x28 DES), LCX 50p/m, RCA 30-40p, 109m EF 65-70%.  . Type 2 diabetes mellitus   . GERD (gastroesophageal reflux disease)   . Phimosis     a. s/p circumcision 2015.  .Marland KitchenHyperlipidemia   . History of concussion     1976--  NO RESIDUAL  . Iron deficiency anemia   . History of GI bleed     a. UGIB 07/2012;  b. 01/2014 admission with GIB/FOB stool req 1U prbc's->EGD showed portal gastropathy, barrett's esoph, and chronic active h. pylori gastritis.  .Marland KitchenHistory of gout     2007 &  2008  LEFT LEG-- NO ISSUE SINCE  . Chronic low back pain   . OSA (obstructive  sleep apnea)     PULMOLOGIST-  DR CLANCE--  MODERATE OSA  STARTED CPAP 2012--  BUT CURRENTLY HAS NOT USED PAST 6 MONTHS  . Hepatic cirrhosis     a. Dx 01/2014 - CT a/p   . Kidney stones   . Asthma   . History of blood transfusion     "related to bleeding ulcers"  . Arthritis     "joints tighten up sometimes" (03/27/2104)  . OA (osteoarthritis of spine)     LOWER BACK--  INTERMITTANT LEFT LEG NUMBNESS  . Chronic lower back pain     Past Surgical History  Procedure Laterality Date  . Esophagogastroduodenoscopy  08/15/2012    Procedure: ESOPHAGOGASTRODUODENOSCOPY (EGD);  Surgeon: SWonda Horner MD;  [facility];  Service: Endoscopy;  Laterality: N/A;  . Ankle fracture surgery Right 1989    "plate put in"  . Laparoscopic umbilical hernia repair w/ mesh  06-06-2011  . Open appendectomy w/ partial cecectomy  05-16-2004  . Dobutamine stress echo  06-08-2012    MODERATE HYPOKINESIS/ ISCHEMIA MID INFERIOR WALL  . Nephrolithotomy  1990'S  . Circumcision N/A 09/09/2013    Procedure: CIRCUMCISION ADULT;  Surgeon: DBernestine Amass MD;  [facility]  Service: Urology;  Laterality: N/A;  . Esophagogastroduodenoscopy N/A 02/17/2014    Procedure:  ESOPHAGOGASTRODUODENOSCOPY (EGD);  Surgeon: Jeryl Columbia, MD;  Location: [facility] ENDOSCOPY;  Service: Endoscopy;  Laterality: N/A;  . Hernia repair    . Appendectomy  05-16-2004    open  . Colectomy  05-16-2004  . Coronary angioplasty with stent placement  06/28/2012  DR COOPER    PCI W/  X1 DES to [place]. LAD/  LM  40% OSTIAL & 50-60% DISTAL /  50% PROX LCX/  30-40% PROX RCA & 50% MID RCA/   LVEF 65-70%    Family History  Problem Relation Age of Onset  . Coronary artery disease    . Diabetes    . Colon cancer    . Lung cancer Sister   . Cancer Sister     lung  . Cancer Brother     lung  . Cancer Sister     lung  . Cancer Father     died in his 57s.  . Cancer Mother     Social History:  reports that he quit  smoking about 17 years ago. His smoking use included Cigarettes. He has a 80 pack-year smoking history. He has never used smokeless tobacco. He reports that he drinks alcohol. He reports that he does not use illicit drugs.  Allergies: No Known Allergies  Medications:  Prior to Admission:  Prescriptions prior to admission  Medication Sig Dispense Refill  . aspirin 81 MG chewable tablet Chew 1 tablet (81 mg total) by mouth daily.      Marland Kitchen atorvastatin (LIPITOR) 80 MG tablet Take 80 mg by mouth daily.      Marland Kitchen esomeprazole (NEXIUM) 20 MG capsule Take 20 mg by mouth 2 (two) times daily.      . ferrous sulfate 325 (65 FE) MG tablet Take 325 mg by mouth 2 (two) times daily with a meal.       . furosemide (LASIX) 20 MG tablet Take 1 tablet (20 mg total) by mouth 2 (two) times daily.  60 tablet  3  . losartan (COZAAR) 25 MG tablet Take 1 tablet (25 mg total) by mouth daily.  90 tablet  0  . metFORMIN (GLUCOPHAGE) 500 MG tablet Take 500 mg by mouth 2 (two) times daily with a meal.      . nitroGLYCERIN (NITROSTAT) 0.4 MG SL tablet Place 1 tablet (0.4 mg total) under the tongue every 5 (five) minutes as needed for chest pain.  25 tablet  3    Results for orders placed during the hospital encounter of 03/27/14 (from the past 48 hour(s))  CBC     Status: Abnormal   Collection Time    03/27/14 12:37 PM      Result Value Ref Range   WBC 3.7 (*) 4.0 - 10.5 K/uL   RBC 3.37 (*) 4.22 - 5.81 MIL/uL   Hemoglobin 9.0 (*) 13.0 - 17.0 g/dL   HCT 29.5 (*) 39.0 - 52.0 %   MCV 87.5  78.0 - 100.0 fL   MCH 26.7  26.0 - 34.0 pg   MCHC 30.5  30.0 - 36.0 g/dL   RDW 15.7 (*) 11.5 - 15.5 %   Platelets 122 (*) 150 - 400 K/uL  BASIC METABOLIC PANEL     Status: Abnormal   Collection Time    03/27/14 12:37 PM      Result Value Ref Range   Sodium 141  137 - 147 mEq/L   Potassium 4.2  3.7 - 5.3 mEq/L   Chloride 104  96 - 112 mEq/L  CO2 24  19 - 32 mEq/L   Glucose, Bld 211 (*) 70 - 99 mg/dL   BUN 16  6 - 23 mg/dL    Creatinine, Ser 0.68  0.50 - 1.35 mg/dL   Calcium 8.7  8.4 - 10.5 mg/dL   GFR calc non Af Amer >90  >90 mL/min   GFR calc Af Amer >90  >90 mL/min   Comment: (NOTE)     The eGFR has been calculated using the CKD EPI equation.     This calculation has not been validated in all clinical situations.     eGFR's persistently <90 mL/min signify possible Chronic Kidney     Disease.   Anion gap 13  5 - 15  I-STAT TROPOININ, ED     Status: None   Collection Time    03/27/14  1:13 PM      Result Value Ref Range   Troponin i, poc 0.00  0.00 - 0.08 ng/mL   Comment 3            Comment: Due to the release kinetics of cTnI,     a negative result within the first hours     of the onset of symptoms does not rule out     myocardial infarction with certainty.     If myocardial infarction is still suspected,     repeat the test at appropriate intervals.  PROTIME-INR     Status: None   Collection Time    03/27/14  2:28 PM      Result Value Ref Range   Prothrombin Time 14.2  11.6 - 15.2 seconds   INR 1.10  0.00 - 1.49  I-STAT TROPOININ, ED     Status: None   Collection Time    03/27/14  2:58 PM      Result Value Ref Range   Troponin i, poc 0.00  0.00 - 0.08 ng/mL   Comment 3            Comment: Due to the release kinetics of cTnI,     a negative result within the first hours     of the onset of symptoms does not rule out     myocardial infarction with certainty.     If myocardial infarction is still suspected,     repeat the test at appropriate intervals.  GLUCOSE, CAPILLARY     Status: Abnormal   Collection Time    03/27/14  5:57 PM      Result Value Ref Range   Glucose-Capillary 226 (*) 70 - 99 mg/dL  TROPONIN I     Status: None   Collection Time    03/27/14  7:40 PM      Result Value Ref Range   Troponin I <0.30  <0.30 ng/mL   Comment:            Due to the release kinetics of cTnI,     a negative result within the first hours     of the onset of symptoms does not rule out      myocardial infarction with certainty.     If myocardial infarction is still suspected,     repeat the test at appropriate intervals.  GLUCOSE, CAPILLARY     Status: Abnormal   Collection Time    03/27/14  9:00 PM      Result Value Ref Range   Glucose-Capillary 144 (*) 70 - 99 mg/dL   Comment 1 Documented in Chart  Comment 2 Notify RN    TROPONIN I     Status: None   Collection Time    03/28/14  1:18 AM      Result Value Ref Range   Troponin I <0.30  <0.30 ng/mL   Comment:            Due to the release kinetics of cTnI,     a negative result within the first hours     of the onset of symptoms does not rule out     myocardial infarction with certainty.     If myocardial infarction is still suspected,     repeat the test at appropriate intervals.  COMPREHENSIVE METABOLIC PANEL     Status: Abnormal   Collection Time    03/28/14  1:30 AM      Result Value Ref Range   Sodium 140  137 - 147 mEq/L   Potassium 4.4  3.7 - 5.3 mEq/L   Chloride 104  96 - 112 mEq/L   CO2 27  19 - 32 mEq/L   Glucose, Bld 101 (*) 70 - 99 mg/dL   BUN 13  6 - 23 mg/dL   Creatinine, Ser 0.73  0.50 - 1.35 mg/dL   Calcium 8.8  8.4 - 10.5 mg/dL   Total Protein 6.8  6.0 - 8.3 g/dL   Albumin 3.7  3.5 - 5.2 g/dL   AST 22  0 - 37 U/L   ALT 12  0 - 53 U/L   Alkaline Phosphatase 92  39 - 117 U/L   Total Bilirubin 0.3  0.3 - 1.2 mg/dL   GFR calc non Af Amer >90  >90 mL/min   GFR calc Af Amer >90  >90 mL/min   Comment: (NOTE)     The eGFR has been calculated using the CKD EPI equation.     This calculation has not been validated in all clinical situations.     eGFR's persistently <90 mL/min signify possible Chronic Kidney     Disease.   Anion gap 9  5 - 15  CBC     Status: Abnormal   Collection Time    03/28/14  1:30 AM      Result Value Ref Range   WBC 3.5 (*) 4.0 - 10.5 K/uL   RBC 3.30 (*) 4.22 - 5.81 MIL/uL   Hemoglobin 9.0 (*) 13.0 - 17.0 g/dL   HCT 28.9 (*) 39.0 - 52.0 %   MCV 87.6  78.0 - 100.0 fL    MCH 27.3  26.0 - 34.0 pg   MCHC 31.1  30.0 - 36.0 g/dL   RDW 15.8 (*) 11.5 - 15.5 %   Platelets 118 (*) 150 - 400 K/uL   Comment: REPEATED TO VERIFY     SPECIMEN CHECKED FOR CLOTS     PLATELET COUNT CONFIRMED BY SMEAR  TROPONIN I     Status: None   Collection Time    03/28/14  6:40 AM      Result Value Ref Range   Troponin I <0.30  <0.30 ng/mL   Comment:            Due to the release kinetics of cTnI,     a negative result within the first hours     of the onset of symptoms does not rule out     myocardial infarction with certainty.     If myocardial infarction is still suspected,     repeat the test at appropriate intervals.  GLUCOSE, CAPILLARY     Status: Abnormal   Collection Time    03/28/14  8:51 AM      Result Value Ref Range   Glucose-Capillary 115 (*) 70 - 99 mg/dL  GLUCOSE, CAPILLARY     Status: Abnormal   Collection Time    03/28/14 12:00 PM      Result Value Ref Range   Glucose-Capillary 189 (*) 70 - 99 mg/dL  GLUCOSE, CAPILLARY     Status: Abnormal   Collection Time    03/28/14  3:43 PM      Result Value Ref Range   Glucose-Capillary 149 (*) 70 - 99 mg/dL  GLUCOSE, CAPILLARY     Status: Abnormal   Collection Time    03/28/14  8:44 PM      Result Value Ref Range   Glucose-Capillary 133 (*) 70 - 99 mg/dL   Comment 1 Notify RN    GLUCOSE, CAPILLARY     Status: Abnormal   Collection Time    03/29/14  7:45 AM      Result Value Ref Range   Glucose-Capillary 116 (*) 70 - 99 mg/dL   Comment 1 Documented in Chart     Comment 2 Notify RN      Dg Chest 2 View  03/27/2014   CLINICAL DATA:  Left-sided chest pain and arm tingling; history of coronary artery stent placement and diabetes  EXAM: CHEST  2 VIEW  COMPARISON:  Portable chest x-ray dated February 21, 2013  FINDINGS: The lungs are adequately inflated. There is no focal infiltrate. The heart and pulmonary vascularity are normal. There is no pleural effusion or pneumothorax or pneumomediastinum. The bony thorax  exhibits no acute abnormality.  IMPRESSION: There is no acute cardiopulmonary abnormality.   Electronically Signed   By: David  [place]   On: 03/27/2014 13:51    Review of Systems  Constitutional: Positive for malaise/fatigue. Negative for fever and chills.  Respiratory: Positive for shortness of breath.   Cardiovascular: Positive for chest pain and leg swelling.  Gastrointestinal: Positive for nausea and blood in stool (duing recent admission, none currently).  All other systems reviewed and are negative.  Blood pressure 109/52, pulse 64, temperature 97.6 F (36.4 C), temperature source Oral, resp. rate 18, weight 250 lb 10.6 oz (113.7 kg), SpO2 97.00%. Physical Exam  Vitals reviewed. Constitutional: He is oriented to person, place, and time. He appears well-developed and well-nourished. No distress.  HENT:  Head: Normocephalic and atraumatic.  Eyes: EOM are normal. Pupils are equal, round, and reactive to light.  Neck: Neck supple.  No bruits  Cardiovascular: Normal rate, regular rhythm and normal heart sounds.   No murmur heard. Respiratory: Breath sounds normal. He has no wheezes. He has no rales.  GI: Soft. There is no tenderness.  Musculoskeletal: He exhibits edema.  Lymphadenopathy:    He has no cervical adenopathy.  Neurological: He is alert and oriented to person, place, and time. No cranial nerve deficit.  Skin:  Chronic venous stasis changes both LE   CARDIAC CATHETERIZATION  HEMODYNAMICS: Aortic pressure 119/59 mmHg; LV pressure 126/4 mmHg; LVEDP 21 mm mercury  ANGIOGRAPHIC DATA: The left main coronary artery is heavily calcified. There is 30% proximal stenosis. An 70-80% distal left main stenosis.  The left anterior descending artery is patent, contains a proximal stent, and an eccentric ostial 60-70% narrowing. The distal vessel is large.  The left circumflex artery is calcified with ostial 90%, hazy stenosis. The mid vessel contains segmental 80-90%  narrowing before  ending, trifurcating obtuse marginal.  The right coronary artery is heavy proximal to mid calcification. There is proximal and mid 50% narrowing. Large PDA and left ventricular branch arises distally.Marland Kitchen  LEFT VENTRICULOGRAM: Left ventricular angiogram was done in the 30 RAO projection and revealed a cavity that is upper limit of normal in size. Contractility is normal with an EF of 60%.  IMPRESSIONS: 1. Severe distal left main with involvement of the ostium of the LAD and critical narrowing of the ostial circumflex. The mid circumflex is also diseased. The proximal LAD stent is widely patent.  2. Heavy calcification with moderate obstruction in the proximal and mid RCA  3. Normal left ventricular systolic function and hemodynamics.  PT= 14.2, T bili= 0.3, albumin= 3.7  Assessment/Plan: 68 yo man with multiple CRF and known CAD presented with unstable angina. He ruled out for MI. At catheterization he was found to have severe left main disease. CABG is indicated for survival benefit and relief of symptoms.  He was recently diagnosed with cirrhosis, but his albumin, bilirubin and prothrombin time are normal. While it does increase his risk, it is not prohibitive.  I discussed the general nature of the procedure, the need for general anesthesia, the use of cardiopulmonary bypass, and the incisions to be used with Mr and Mrs Wence. We discussed the expected hospital stay, overall recovery and short and long term outcomes. I reviewed the indications, risks, benefits and alternatives with them. They understand the risks include, but are not limited to death, stroke, MI, DVT/PE, bleeding, possible need for transfusion, infections, cardiac arrhythmias, and other organ system dysfunction including respiratory, renal, or GI complications.   He accepts the risks and agrees to proceed.  For OR 1st case Monday 8/3  Jaiquan Temme C 03/29/2014, 11:22 AM

## 2014-03-31 NOTE — CV Procedure (Signed)
Intraoperative Trans-esophageal Echocardiography Report:  [name] is a 68 year old male with a history of hypertension, type 2 diabetes, hyperlipidemia, and  previous tobacco use. He has a history of known coronary artery disease with a previous drug alluding stent placed in the LAD in October, 2013. He was admitted to [facility] on 03/27/14 with a one-month history of exertional chest pain and on the day of admission he had one severe at episode of chest pain while walking out of church. He then underwent  cardiac  catheterization which revealed  80% left main stenosis and a 50% stenosis in the RCA. He is now scheduled to undergo coronary artery bypass grafting by Dr. Roxan Hockey.  Intraoperative transesophageal echocardiography was requested to evaluate the left and right ventricular function, to determine if any valvular pathology was present, and to serve as a monitor for intraoperative volume status.  The patient was brought to the operating room at [facility] and general anesthesia was induced without difficulty. Following endotracheal intubation and orogastric suctioning, the transesophageal echocardiography probe was inserted into the esophagus without difficulty.  Impression: Pre-bypass findings:  1. Aortic valve: The aortic valve was trileaflet. The leaflets were mildly thickened but opened normally without restriction. There was trace aortic insufficiency.  2. Mitral valve: There was mild mitral annular calcification. The leaflets opened normally without restriction. The leaflets coapted well and there was trace mitral insufficiency. There were no prolapsing or flail segments noted.  3. Left ventricle: The LV cavity was within normal limits of size and measured 5.0 cm at end diastole at the mid-papillary level in the transgastric short axis view. There were no regional wall motion abnormalities and normal contractility in all segments interrogated. Ejection fraction  was estimated at 55-60%. There was mild left concentric left ventricular hypertrophy. Left ventricular end-diastolic wall thickness measured 1.07 at the  posterior wall and 1.11 cm at the anterior wall.  4. Right ventricle: The right ventricular cavity was of normal limits in size. There was normal contractility of the right ventricular free wall and normal appearing right ventricular function.  5. Tricuspid valve: The tricuspid valve appeared structurally normal and there was trace tricuspid insufficiency.  6. Interatrial septum: The interatrial septum was intact without evidence of patent foramen ovale or atrial septal defect by color Doppler or bubble study.  7. Left atrium: There was no thrombus noted within the left atrium or left atrial appendage.  8. Ascending aorta: The  ascending aorta showed a well-defined aortic root and sinotubular ridge without effacement. There was moderate calcification within the walls of the ascending aorta but no mobile plaques noted.  9. Descending aorta: The descending aorta was of normal diameter. There were scattered grade 2-3 atheromatous plaques noted within the walls of the descending aorta.  Post-bypass findings:  1. Aortic valve: The aortic valve was unchanged from the pre-bypass study. The leaflets opened normally and there was trace aortic insufficiency.  2. Mitral valve: The mitral leaflets coapted normally and there was trace mitral insufficiency.  3. The left ventricle: There was some degree of dyssynchronous contractility due to ventricular pacing. However there were no regional wall motion abnormalities and the ejection fraction was estimated at 55%.  4. Right ventricle: The right ventricular cavity was of normal limits in size and there was normal appearing right ventricular function.  6. Tricuspid valve: There was trace tricuspid insufficiency which appeared unchanged from the pre-bypass study.  Roberts Gaudy, M.D.

## 2014-03-31 NOTE — Anesthesia Procedure Notes (Signed)
Anesthesia Procedure Note PA catheter:  Routine monitors. Timeout, sterile prep, drape, FBP R neck.  Trendelenburg position.  1% Lido local, finder and trocar RIJ 1st pass with US guidance.  2-Lumen Cordis placed over J wire. PA catheter in easily.  Sterile dressing applied.  Patient tolerated well, VSS.  Jenita Seashore, MD [phone]

## 2014-03-31 NOTE — Progress Notes (Signed)
03/31/2014 UR completed. Additional Medicare IM given. [name] RN CCM Case Mgmt phone [phone]

## 2014-03-31 NOTE — Op Note (Signed)
NAMECATLIN, AYCOCK                 ACCOUNT NO.:  [id number]  MEDICAL RECORD NO.:  [id number]  LOCATION:  2S14C                        FACILITY:  [facility]  PHYSICIAN:  Kwan Shellhammer. Roxan Hockey, M.D.DATE OF BIRTH:  [birth year]  DATE OF PROCEDURE:  03/31/2014 DATE OF DISCHARGE:                              OPERATIVE REPORT   PREOPERATIVE DIAGNOSIS:  Left main and three-vessel disease with unstable angina.  POSTOPERATIVE DIAGNOSIS:  Left main and three-vessel disease with unstable angina.  PROCEDURE:  Median sternotomy, extracorporeal circulation, coronary artery bypass grafting x3 (left internal mammary artery to left anterior descending, saphenous vein graft to obtuse marginal 1, saphenous vein graft to posterior descending), endoscopic vein harvest right leg.  SURGEON:  Priyansh Pry. Roxan Hockey, M.D.  ASSISTANT:  John Giovanni, P.A.-C.  ANESTHESIA:  General.  FINDINGS:  Good quality conduits, good quality targets. Cardiomegaly. Preserved left ventricular function by echocardiogram. Left ventricular hypertrophy.  No significant valvular pathology.  CLINICAL NOTE:  Mr. Dillinger is a 68 year old gentleman with a known history of multiple coronary risk factors and preexisting coronary artery disease.  He had a drug-eluting stent placed in his LAD in 2013.  He presented with a 80-month history of angina, this had become progressively worse.  He underwent cardiac catheterization, and was found to have an 80% left main stenosis, as well as a 50% stenosis in his right coronary.  He was advised to undergo coronary artery bypass grafting. The indications, risks, benefits, and alternatives were discussed in detail with the patient.  He understood and accepted the risks and agreed to proceed.  OPERATIVE NOTE:  Mr. Dziedzic was brought to the preoperative holding area on March 31, 2014. There Anesthesia placed a Swan-Ganz catheter and an arterial blood pressure monitoring line.  Intravenous  antibiotics were administered.  He was taken to the operating room, anesthetized, and intubated.  Transesophageal echocardiography was performed.  It revealed left ventricular hypertrophy with preserved ventricular systolic function and no significant valvular pathology.  The chest, abdomen, and legs were prepped and draped in usual sterile fashion.  I  An incision was made in the medial aspect of the right leg at the level of the knee.  The greater saphenous vein was harvested endoscopically from the upper calf to the groin.  Simultaneously with this, a median sternotomy was performed and the left internal mammary artery was harvested using standard technique.  5000 units of heparin was administered during the vessel harvest.  The remainder of the full heparin dose was given prior to opening the pericardium.  After harvesting the conduits, the pericardium was opened.  The ascending aorta was inspected.  There was no evidence of atherosclerotic disease. The aorta was cannulated via concentric 2-0 Ethibond pledgeted pursestring sutures.  A dual-stage venous cannula was placed via pursestring suture in the right atrial appendage.  Cardiopulmonary bypass was instituted.  The patient was cooled to 32 degrees Celsius. The coronary arteries were inspected and anastomotic sites were chosen. The conduits were inspected and cut to length.  A foam pad was placed in the pericardium to insulate the heart and protect the left phrenic nerve.  A temperature probe was placed in myocardial septum and a  cardioplegia cannula placed in the ascending aorta.  The aorta was crossclamped.  The left ventricle was emptied via the aortic root vent.  Cardiac arrest was achieved with a combination of cold antegrade blood cardioplegia and topical iced saline.  Initially 1 L of cardioplegia was administered.  The myocardial septal temperature fell to 13 degree Celsius, there was a rapid diastolic arrest.  A  reversed saphenous vein graft was placed end-to-side to the posterior descending branch of the right coronary.  This was placed just at the origin of the vessel.  There was some plaque at the proximal extent of the anastomosis. An end-to-side anastomosis was performed with a running 7-0 Prolene suture.  Both the vein and artery were good quality.  All anastomoses were probed proximally and distally at their completion. Cardioplegia was administered down the vein graft.  There was good flow and good hemostasis.  Additional cardioplegia was also administered down the aortic root.  Next, the heart was retracted to the right exposing the obtuse marginal vessel on the lateral aspect of the heart.  An arteriotomy was made just before the bifurcation.  A 1.5 mm probe passed easily into both branches.  A good quality vein graft was anastomosed end to side with a running 7-0 Prolene suture.  A probe passed easily proximally and distally.  At the completion of the anastomosis, cardioplegia was administered.  There was good flow and good hemostasis.  Additional cardioplegia was administered down the aortic root.  Next, the heart was elevated exposing the left anterior descending.  An arteriotomy was made.  This was a 1.5 mm good quality target.  A probe did pass distally to the apex, although there was some plaquing in the vessel distally.  The mammary was brought through a window in the pericardium and beveled.  It was a 1.5 mm good quality conduit.  An end- to-side anastomosis was performed with a running 8-0 Prolene suture.  At completion of the anastomosis, the bulldog clamp was removed.  Rapid septal rewarming was noted.  The bulldog clamp was replaced, and the mammary pedicle was tacked to the epicardial surface of the heart with 6-0 Prolene sutures.  Additional cardioplegia was administered.  The vein grafts were cut to length.  The cardioplegia cannula was removed from the ascending  aorta. The proximal vein graft anastomoses were performed to 4.5 mm punch aortotomies with running 6-0 Prolene sutures.  At the completion of the final proximal anastomosis, the patient was placed in Trendelenburg position.  Lidocaine was administered.  The aortic root was de-aired and the aortic crossclamp was removed.  The total crossclamp time was 65 minutes.  The patient spontaneously resumed sinus rhythm and did not require defibrillation.  While rewarming was completed, all proximal and distal anastomoses were inspected for hemostasis.  Epicardial pacing wires were placed on the right ventricle and right atrium.  DDD pacing was initiated for bradycardia.  When the patient had rewarmed to a core temperature of 37 degrees Celsius, he was weaned from cardiopulmonary bypass on the first attempt without difficulty.  Total bypass time was 99 minutes.  The initial cardiac index was greater than 2 liters/minute/meter squared, and the patient remained hemodynamically stable throughout the postbypass period.  Postbypass transesophageal echocardiography was unchanged from the prebypass study.  There was preserved left ventricular systolic function.  A test dose of protamine was administered and was well tolerated.  The atrial and aortic cannulae were removed.  The remainder of protamine was administered without incident.  The chest was irrigated with warm saline. Hemostasis was achieved.  Two mediastinal chest tubes were placed through separate subcostal incisions and secured with #1 silk sutures. The sternum was closed with a combination of single and double heavy gauge stainless steel wires. The pectoralis fascia, subcutaneous tissue, and skin were closed in standard fashion.  All sponge, needle, and instrument counts were correct at the end of the procedure. There were no intraoperative complications.  The patient was taken from the operating room to the surgical intensive care unit in  good condition.     Revonda Standard Roxan Hockey, M.D.     SCH/MEDQ  D:  03/31/2014  T:  03/31/2014  Job:  [id number]

## 2014-03-31 NOTE — Transfer of Care (Signed)
Immediate Anesthesia Transfer of Care Note  Patient: [name]  Procedure(s) Performed: Procedure(s): CORONARY ARTERY BYPASS GRAFTING (CABG) times 3 using left internal mammary artery and right saphenous vein. (N/A) INTRAOPERATIVE TRANSESOPHAGEAL ECHOCARDIOGRAM (N/A)  Patient Location: SICU  Anesthesia Type:General  Level of Consciousness: Patient remains intubated per anesthesia plan  Airway & Oxygen Therapy: Patient remains intubated per anesthesia plan and Patient placed on Ventilator (see vital sign flow sheet for setting)  Post-op Assessment: Post -op Vital signs reviewed and stable  Post vital signs: Reviewed and stable  Complications: No apparent anesthesia complications

## 2014-03-31 NOTE — Anesthesia Preprocedure Evaluation (Signed)
Anesthesia Evaluation  Patient identified by MRN, date of birth, ID band Patient awake    Reviewed: Allergy & Precautions, H&P , NPO status , Patient's Chart, lab work & pertinent test results  Airway Mallampati: II TM Distance: >3 FB Neck ROM: Full    Dental  (+) Teeth Intact, Dental Advisory Given   Pulmonary former smoker,  breath sounds clear to auscultation        Cardiovascular hypertension, Rhythm:Regular Rate:Normal     Neuro/Psych    GI/Hepatic   Endo/Other  diabetes  Renal/GU      Musculoskeletal   Abdominal   Peds  Hematology   Anesthesia Other Findings   Reproductive/Obstetrics                           Anesthesia Physical Anesthesia Plan  ASA: III  Anesthesia Plan: General   Post-op Pain Management:    Induction: Intravenous  Airway Management Planned: Oral ETT  Additional Equipment: Arterial line, PA Cath, 3D TEE and CVP  Intra-op Plan:   Post-operative Plan:   Informed Consent: I have reviewed the patients History and Physical, chart, labs and discussed the procedure including the risks, benefits and alternatives for the proposed anesthesia with the patient or authorized representative who has indicated his/her understanding and acceptance.   Dental advisory given  Plan Discussed with: CRNA and Anesthesiologist  Anesthesia Plan Comments:         Anesthesia Quick Evaluation

## 2014-03-31 NOTE — Progress Notes (Signed)
Patient ID: ASHTYN FREILICH, male   DOB: 01-21-1946, 68 y.o.   MRN: [id number] EVENING ROUNDS NOTE :     [address]       [place] [address]             [phone]                 Day of Surgery Procedure(s) (LRB): CORONARY ARTERY BYPASS GRAFTING (CABG) times 3 using left internal mammary artery and right saphenous vein. (N/A) INTRAOPERATIVE TRANSESOPHAGEAL ECHOCARDIOGRAM (N/A)  Total Length of Stay:  LOS: 4 days  BP 137/66  Pulse 79  Temp(Src) 99.1 F (37.3 C) (Oral)  Resp 18  Ht 5\' 8"  (1.727 m)  Wt 241 lb 9.6 oz (109.589 kg)  BMI 36.74 kg/m2  SpO2 100%  .Intake/Output     08/02 0701 - 08/03 0700 08/03 0701 - 08/04 0700   P.O. 360    I.V. (mL/kg)  2904.3 (26.5)   Blood  602   NG/GT  30   IV Piggyback  600   Total Intake(mL/kg) 360 (3.3) 4136.3 (37.7)   Urine (mL/kg/hr)  1475 (1.3)   Emesis/NG output  75 (0.1)   Blood  1100 (1)   Chest Tube  150 (0.1)   Total Output   2800   Net +360 +1336.3        Urine Occurrence 3 x    Stool Occurrence 1 x      . sodium chloride 20 mL/hr at 03/31/14 1600  . sodium chloride 20 mL/hr at 03/31/14 1600  . [START ON 04/01/2014] sodium chloride    . dexmedetomidine Stopped (03/31/14 1600)  . insulin (NOVOLIN-R) infusion 0.9 Units/hr (03/31/14 1600)  . lactated ringers 10 mL/hr at 03/31/14 1600  . nitroGLYCERIN Stopped (03/31/14 1230)  . phenylephrine (NEO-SYNEPHRINE) Adult infusion 40 mcg/min (03/31/14 1705)     Lab Results  Component Value Date   WBC 17.0* 03/31/2014   HGB 11.2* 03/31/2014   HCT 33.0* 03/31/2014   PLT 149* 03/31/2014   GLUCOSE 153* 03/31/2014   CHOL 94 07/16/2013   TRIG 75.0 07/16/2013   HDL 36.10* 07/16/2013   LDLCALC 43 07/16/2013   ALT 12 03/28/2014   AST 22 03/28/2014   NA 139 03/31/2014   K 4.2 03/31/2014   CL 101 03/31/2014   CREATININE 0.60 03/31/2014   BUN 12 03/31/2014   CO2 27 03/30/2014   TSH 1.28 05/28/2012   INR 1.46 03/31/2014   HGBA1C 5.6 02/27/2014   MICROALBUR 0.8 03/13/2013   Early post  op, intubated starting to wake up Not bleeding  Grace Isaac MD  Beeper [phone] Office [phone] 03/31/2014 5:12 PM

## 2014-03-31 NOTE — Procedures (Signed)
**Note De-Identified Langford Carias Obfuscation** Extubation Procedure Note  Patient Details:   Name: [name] DOB: 1946-07-07 MRN: [id number]   Airway Documentation:  Airway 8 mm (Active)  Secured at (cm) 22 cm 03/31/2014  4:50 PM  Measured From Lips 03/31/2014  4:50 PM  Secured Location Right 03/31/2014 12:25 PM  Secured By Pink Tape 03/31/2014  4:50 PM  Site Condition Dry 03/31/2014  4:50 PM    Evaluation  O2 sats: stable throughout Complications: No apparent complications Patient did tolerate procedure well. Bilateral Breath Sounds: Clear;Diminished   Yes No stridor noted, + leak, NIF > - 60, VC 2.0  Paelyn Smick, Penni Bombard 03/31/2014, 6:00 PM

## 2014-04-01 ENCOUNTER — Inpatient Hospital Stay (HOSPITAL_COMMUNITY): Payer: Medicare Other

## 2014-04-01 LAB — CBC
HCT: 28.9 % — ABNORMAL LOW (ref 39.0–52.0)
HEMATOCRIT: 28.5 % — AB (ref 39.0–52.0)
HEMOGLOBIN: 8.8 g/dL — AB (ref 13.0–17.0)
Hemoglobin: 8.6 g/dL — ABNORMAL LOW (ref 13.0–17.0)
MCH: 26.9 pg (ref 26.0–34.0)
MCH: 26.8 pg (ref 26.0–34.0)
MCHC: 30.4 g/dL (ref 30.0–36.0)
MCHC: 30.2 g/dL (ref 30.0–36.0)
MCV: 88.4 fL (ref 78.0–100.0)
MCV: 88.8 fL (ref 78.0–100.0)
Platelets: 137 10*3/uL — ABNORMAL LOW (ref 150–400)
Platelets: DECREASED 10*3/uL (ref 150–400)
RBC: 3.27 MIL/uL — ABNORMAL LOW (ref 4.22–5.81)
RBC: 3.21 MIL/uL — ABNORMAL LOW (ref 4.22–5.81)
RDW: 16.6 % — ABNORMAL HIGH (ref 11.5–15.5)
RDW: 16.9 % — AB (ref 11.5–15.5)
WBC: 12.9 10*3/uL — AB (ref 4.0–10.5)
WBC: 10.3 10*3/uL (ref 4.0–10.5)

## 2014-04-01 LAB — BASIC METABOLIC PANEL
Anion gap: 10 (ref 5–15)
BUN: 12 mg/dL (ref 6–23)
CO2: 23 mEq/L (ref 19–32)
Calcium: 7.6 mg/dL — ABNORMAL LOW (ref 8.4–10.5)
Chloride: 104 mEq/L (ref 96–112)
Creatinine, Ser: 0.74 mg/dL (ref 0.50–1.35)
GFR calc Af Amer: >90 mL/min (ref >90)
GFR calc non Af Amer: >90 mL/min (ref >90)
Glucose, Bld: 113 mg/dL — ABNORMAL HIGH (ref 70–99)
POTASSIUM: 4.4 meq/L (ref 3.7–5.3)
SODIUM: 137 meq/L (ref 137–147)

## 2014-04-01 LAB — GLUCOSE, CAPILLARY
GLUCOSE-CAPILLARY: 110 mg/dL — AB (ref 70–99)
GLUCOSE-CAPILLARY: 116 mg/dL — AB (ref 70–99)
GLUCOSE-CAPILLARY: 123 mg/dL — AB (ref 70–99)
GLUCOSE-CAPILLARY: 131 mg/dL — AB (ref 70–99)
GLUCOSE-CAPILLARY: 140 mg/dL — AB (ref 70–99)
Glucose-Capillary: 104 mg/dL — ABNORMAL HIGH (ref 70–99)
Glucose-Capillary: 92 mg/dL (ref 70–99)
Glucose-Capillary: 90 mg/dL (ref 70–99)
Glucose-Capillary: 100 mg/dL — ABNORMAL HIGH (ref 70–99)
Glucose-Capillary: 99 mg/dL (ref 70–99)
Glucose-Capillary: 106 mg/dL — ABNORMAL HIGH (ref 70–99)
Glucose-Capillary: 145 mg/dL — ABNORMAL HIGH (ref 70–99)
Glucose-Capillary: 130 mg/dL — ABNORMAL HIGH (ref 70–99)
Glucose-Capillary: 108 mg/dL — ABNORMAL HIGH (ref 70–99)
Glucose-Capillary: 123 mg/dL — ABNORMAL HIGH (ref 70–99)
Glucose-Capillary: 120 mg/dL — ABNORMAL HIGH (ref 70–99)
Glucose-Capillary: 115 mg/dL — ABNORMAL HIGH (ref 70–99)
Glucose-Capillary: 101 mg/dL — ABNORMAL HIGH (ref 70–99)
Glucose-Capillary: 113 mg/dL — ABNORMAL HIGH (ref 70–99)
Glucose-Capillary: 133 mg/dL — ABNORMAL HIGH (ref 70–99)
Glucose-Capillary: 141 mg/dL — ABNORMAL HIGH (ref 70–99)
Glucose-Capillary: 125 mg/dL — ABNORMAL HIGH (ref 70–99)
Glucose-Capillary: 120 mg/dL — ABNORMAL HIGH (ref 70–99)
Glucose-Capillary: 116 mg/dL — ABNORMAL HIGH (ref 70–99)
Glucose-Capillary: 109 mg/dL — ABNORMAL HIGH (ref 70–99)
Glucose-Capillary: 107 mg/dL — ABNORMAL HIGH (ref 70–99)
Glucose-Capillary: 176 mg/dL — ABNORMAL HIGH (ref 70–99)

## 2014-04-01 LAB — MAGNESIUM
MAGNESIUM: 2.2 mg/dL (ref 1.5–2.5)
MAGNESIUM: 2.2 mg/dL (ref 1.5–2.5)

## 2014-04-01 LAB — CREATININE, SERUM
CREATININE: 0.97 mg/dL (ref 0.50–1.35)
GFR calc Af Amer: >90 mL/min (ref >90)
GFR, EST NON AFRICAN AMERICAN: 83 mL/min — AB (ref >90)

## 2014-04-01 LAB — POCT I-STAT, CHEM 8
BUN: 17 mg/dL (ref 6–23)
CHLORIDE: 99 meq/L (ref 96–112)
CREATININE: 1.1 mg/dL (ref 0.50–1.35)
Calcium, Ion: 1.18 mmol/L (ref 1.13–1.30)
GLUCOSE: 134 mg/dL — AB (ref 70–99)
HEMATOCRIT: 31 % — AB (ref 39.0–52.0)
Hemoglobin: 10.5 g/dL — ABNORMAL LOW (ref 13.0–17.0)
POTASSIUM: 4.8 meq/L (ref 3.7–5.3)
Sodium: 134 mEq/L — ABNORMAL LOW (ref 137–147)
TCO2: 24 mmol/L (ref 0–100)

## 2014-04-01 MED ORDER — INSULIN DETEMIR 100 UNIT/ML ~~LOC~~ SOLN
35.0000 [IU] | Freq: Once | SUBCUTANEOUS | Status: AC
  Administered 2014-04-01: 35 [IU] via SUBCUTANEOUS
  Filled 2014-04-01: qty 0.35

## 2014-04-01 MED ORDER — ENOXAPARIN SODIUM 40 MG/0.4ML ~~LOC~~ SOLN
40.0000 mg | Freq: Every day | SUBCUTANEOUS | Status: DC
  Administered 2014-04-01: 40 mg via SUBCUTANEOUS
  Filled 2014-04-01 (×2): qty 0.4

## 2014-04-01 MED ORDER — CETYLPYRIDINIUM CHLORIDE 0.05 % MT LIQD
7.0000 mL | Freq: Two times a day (BID) | OROMUCOSAL | Status: DC
  Administered 2014-04-01 – 2014-04-03 (×6): 7 mL via OROMUCOSAL

## 2014-04-01 MED ORDER — INSULIN ASPART 100 UNIT/ML ~~LOC~~ SOLN: 4.0000 [IU] | Freq: Three times a day (TID) | SUBCUTANEOUS | Status: DC

## 2014-04-01 MED ORDER — FUROSEMIDE 10 MG/ML IJ SOLN
40.0000 mg | Freq: Once | INTRAMUSCULAR | Status: AC
  Administered 2014-04-01: 40 mg via INTRAVENOUS
  Filled 2014-04-01: qty 4

## 2014-04-01 MED ORDER — INSULIN DETEMIR 100 UNIT/ML ~~LOC~~ SOLN
25.0000 [IU] | Freq: Every day | SUBCUTANEOUS | Status: DC
  Filled 2014-04-01: qty 0.25

## 2014-04-01 MED ORDER — INSULIN ASPART 100 UNIT/ML ~~LOC~~ SOLN
0.0000 [IU] | SUBCUTANEOUS | Status: DC
  Administered 2014-04-01: 2 [IU] via SUBCUTANEOUS
  Administered 2014-04-01: 4 [IU] via SUBCUTANEOUS
  Administered 2014-04-01: 2 [IU] via SUBCUTANEOUS

## 2014-04-01 MED FILL — Sodium Bicarbonate IV Soln 8.4%: INTRAVENOUS | Qty: 50 | Status: AC

## 2014-04-01 MED FILL — Electrolyte-R (PH 7.4) Solution: INTRAVENOUS | Qty: 3000 | Status: AC

## 2014-04-01 MED FILL — Potassium Chloride Inj 2 mEq/ML: INTRAVENOUS | Qty: 40 | Status: AC

## 2014-04-01 MED FILL — Sodium Chloride IV Soln 0.9%: INTRAVENOUS | Qty: 2000 | Status: AC

## 2014-04-01 MED FILL — Heparin Sodium (Porcine) Inj 1000 Unit/ML: INTRAMUSCULAR | Qty: 30 | Status: AC

## 2014-04-01 MED FILL — Mannitol IV Soln 20%: INTRAVENOUS | Qty: 500 | Status: AC

## 2014-04-01 MED FILL — Magnesium Sulfate Inj 50%: INTRAMUSCULAR | Qty: 10 | Status: AC

## 2014-04-01 MED FILL — Lidocaine HCl IV Inj 20 MG/ML: INTRAVENOUS | Qty: 5 | Status: AC

## 2014-04-01 MED FILL — Heparin Sodium (Porcine) Inj 1000 Unit/ML: INTRAMUSCULAR | Qty: 10 | Status: AC

## 2014-04-01 NOTE — Progress Notes (Signed)
[facility] DAILY ICU PROGRESS NOTE                   [address]            ,[place] [address]          [phone]   1 Day Post-Op Procedure(s) (LRB): CORONARY ARTERY BYPASS GRAFTING (CABG) times 3 using left internal mammary artery and right saphenous vein. (N/A) INTRAOPERATIVE TRANSESOPHAGEAL ECHOCARDIOGRAM (N/A)  Total Length of Stay:  LOS: 5 days   Subjective: Sore when taking a deep breath and coughing, otherwise stable.   Objective: Vital signs in last 24 hours: Temp:  [97.9 F (36.6 C)-100 F (37.8 C)] 98.8 F (37.1 C) (08/04 0800) Pulse Rate:  [67-80] 75 (08/04 0800) Cardiac Rhythm:  [-] Normal sinus rhythm;Bundle branch block (08/04 0800) Resp:  [10-26] 18 (08/04 0800) BP: (90-137)/(45-66) 110/63 mmHg (08/04 0800) SpO2:  [96 %-100 %] 99 % (08/04 0800) Arterial Line BP: (77-149)/(43-93) 118/52 mmHg (08/04 0800) FiO2 (%):  [40 %-50 %] 40 % (08/03 1715) Weight:  [249 lb 9.6 oz (113.218 kg)] 249 lb 9.6 oz (113.218 kg) (08/04 0500)  Filed Weights   03/30/14 0500 03/30/14 2100 04/01/14 0500  Weight: 240 lb (108.863 kg) 241 lb 9.6 oz (109.589 kg) 249 lb 9.6 oz (113.218 kg)    Weight change: 8 lb (3.629 kg)   Hemodynamic parameters for last 24 hours: PAP: (23-55)/(7-27) 32/18 mmHg CO:  [5.4 L/min-10.1 L/min] 7.7 L/min CI:  [2.5 L/min/m2-4.6 L/min/m2] 3.5 L/min/m2  Intake/Output from previous day: 08/03 0701 - 08/04 0700 In: 5917 [I.V.:4085; Blood:602; NG/GT:30; IV Piggyback:1200] Out: 7494 [Urine:2340; Emesis/NG output:75; Blood:1100; Chest Tube:515]  Intake/Output this shift: Total I/O In: 61.4 [I.V.:61.4] Out: 20 [Urine:20]  CBGs 100-99-105-145-130-131-116-123-108-123-115-113-101-110   Current Meds: Scheduled Meds: . acetaminophen  1,000 mg Oral 4 times per day   Or  . acetaminophen (TYLENOL) oral liquid 160 mg/5 mL  1,000 mg Per Tube 4 times per day  . antiseptic oral rinse  7 mL Mouth Rinse QID  . aspirin EC  325 mg Oral Daily   Or    . aspirin  324 mg Per Tube Daily  . atorvastatin  80 mg Oral Daily  . bisacodyl  10 mg Oral Daily   Or  . bisacodyl  10 mg Rectal Daily  . cefUROXime (ZINACEF)  IV  1.5 g Intravenous Q12H  . chlorhexidine  15 mL Mouth Rinse BID  . docusate sodium  200 mg Oral Daily  . famotidine (PEPCID) IV  20 mg Intravenous Q12H  . insulin regular  0-10 Units Intravenous TID WC  . metoprolol tartrate  12.5 mg Oral BID   Or  . metoprolol tartrate  12.5 mg Per Tube BID  . [START ON 04/02/2014] pantoprazole  40 mg Oral Daily  . sodium chloride  3 mL Intravenous Q12H   Continuous Infusions: . sodium chloride 20 mL/hr at 04/01/14 0700  . sodium chloride 20 mL/hr at 04/01/14 0700  . sodium chloride 250 mL (04/01/14 0637)  . dexmedetomidine Stopped (03/31/14 1600)  . insulin (NOVOLIN-R) infusion 1.6 Units/hr (04/01/14 0800)  . lactated ringers Stopped (04/01/14 0200)  . nitroGLYCERIN Stopped (03/31/14 1230)  . phenylephrine (NEO-SYNEPHRINE) Adult infusion Stopped (04/01/14 0700)   PRN Meds:.albumin human, albuterol, metoprolol, midazolam, morphine injection, ondansetron (ZOFRAN) IV, oxyCODONE, sodium chloride  Physical Exam: General appearance: alert, cooperative and no distress Heart: regular rate and rhythm Lungs: diminished breath sounds bibasilar Extremities: Mild LE edema Wound: Dressed and  dry  Lab Results: CBC: Recent Labs  03/31/14 1830 03/31/14 1854 04/01/14 0400  WBC 10.6*  --  12.9*  HGB 9.2* 10.2* 8.8*  HCT 30.2* 30.0* 28.9*  PLT 132*  --  137*   BMET:  Recent Labs  03/30/14 0435  03/31/14 1854 04/01/14 0400  NA 137  < > 140 137  K 4.2  < > 4.4 4.4  CL 99  < > 104 104  CO2 27  --   --  23  GLUCOSE 113*  < > 121* 113*  BUN 12  < > 11 12  CREATININE 0.71  < > 0.70 0.74  CALCIUM 8.7  --   --  7.6*  < > = values in this interval not displayed.  PT/INR:  Recent Labs  03/31/14 1245  LABPROT 17.7*  INR 1.46   Radiology: Dg Chest Portable 1 View In Am  04/01/2014    CLINICAL DATA:  Coronary artery disease  EXAM: PORTABLE CHEST - 1 VIEW  COMPARISON:  03/31/2014  FINDINGS: Cardiac shadow is stable. Drainage catheters are again seen and stable. The endotracheal tube and nasogastric catheter have been removed. A Swan-Ganz catheter remains in the right pulmonary artery. Mild right basilar atelectasis is seen. No focal pneumothorax or infiltrate is noted.  IMPRESSION: Mild persistent right basilar atelectasis   Electronically Signed   By: Inez Catalina M.D.   On: 04/01/2014 07:52   Dg Chest Portable 1 View  03/31/2014   CLINICAL DATA:  Post CABG.  EXAM: PORTABLE CHEST - 1 VIEW  COMPARISON:  Chest radiograph March 27, 2014  FINDINGS: Cardiac silhouette appears upper limits of normal in size, mediastinal silhouette is nonsuspicious, mildly calcified aortic knob. Status post interval median sternotomy. Endotracheal tube tip projects 3.8 cm above the carina. Right internal jugular central venous sheath with swans Ganz catheter distal tip projecting at main pulmonary artery. Mediastinal and a left lung base chest tubes. Bibasilar strandy densities central pulmonary vascular congestion. No pleural effusions. No pneumothorax. Soft tissue planes and included osseous structures are not acute.  IMPRESSION: Status post interval CABG. Mild central pulmonary vascular congestion and bibasilar atelectasis.  Well positioned life support lines.   Electronically Signed   By: Elon Alas   On: 03/31/2014 13:23     Assessment/Plan: S/P Procedure(s) (LRB): CORONARY ARTERY BYPASS GRAFTING (CABG) times 3 using left internal mammary artery and right saphenous vein. (N/A) INTRAOPERATIVE TRANSESOPHAGEAL ECHOCARDIOGRAM (N/A)  CV- SR, BPs stable.  Off Neo.  Start low dose beta blocker as tolerated.  Pulm- pulm toilet/IS.  Vol overload- diurese.  DM- CBGs stable, transition insulin gtt to Levemir and resume po meds as able.  A1C=5.6.  Expected postop blood loss anemia- H/H stable,  monitor.  Mobilize, d/c lines and CTs, routine POD #1 progression.    Mercedes Fort H 04/01/2014 8:10 AM

## 2014-04-01 NOTE — Anesthesia Postprocedure Evaluation (Signed)
  Anesthesia Post-op Note  Patient: [name]  Procedure(s) Performed: Procedure(s): CORONARY ARTERY BYPASS GRAFTING (CABG) times 3 using left internal mammary artery and right saphenous vein. (N/A) INTRAOPERATIVE TRANSESOPHAGEAL ECHOCARDIOGRAM (N/A)  Patient Location: SICU  Anesthesia Type:General  Level of Consciousness: awake, alert  and oriented  Airway and Oxygen Therapy: Patient Spontanous Breathing and Patient connected to nasal cannula oxygen  Post-op Pain: mild  Post-op Assessment: Post-op Vital signs reviewed, Patient's Cardiovascular Status Stable, Respiratory Function Stable, Patent Airway, No signs of Nausea or vomiting, Adequate PO intake and Pain level controlled  Post-op Vital Signs: stable  Last Vitals:  Filed Vitals:   04/01/14 1800  BP: 102/52  Pulse: 70  Temp:   Resp: 13    Complications: No apparent anesthesia complications

## 2014-04-01 NOTE — Progress Notes (Addendum)
[facility] BRIEF SICU PROGRESS NOTE  1 Day Post-Op  S/P Procedure(s) (LRB): CORONARY ARTERY BYPASS GRAFTING (CABG) times 3 using left internal mammary artery and right saphenous vein. (N/A) INTRAOPERATIVE TRANSESOPHAGEAL ECHOCARDIOGRAM (N/A)   Stable day NSR w/ stable BP off drips O2 sats 97-98% on 1 L/min via [place] UOP adequate Hgb stable 8.6  Plan: Continue current plan  [name] H 04/01/2014 4:42 PM

## 2014-04-02 ENCOUNTER — Inpatient Hospital Stay (HOSPITAL_COMMUNITY): Payer: Medicare Other

## 2014-04-02 LAB — GLUCOSE, CAPILLARY
GLUCOSE-CAPILLARY: 120 mg/dL — AB (ref 70–99)
Glucose-Capillary: 106 mg/dL — ABNORMAL HIGH (ref 70–99)
Glucose-Capillary: 135 mg/dL — ABNORMAL HIGH (ref 70–99)
Glucose-Capillary: 136 mg/dL — ABNORMAL HIGH (ref 70–99)
Glucose-Capillary: 139 mg/dL — ABNORMAL HIGH (ref 70–99)

## 2014-04-02 LAB — CBC
HEMATOCRIT: 28.9 % — AB (ref 39.0–52.0)
HEMOGLOBIN: 8.8 g/dL — AB (ref 13.0–17.0)
MCH: 27.6 pg (ref 26.0–34.0)
MCHC: 30.4 g/dL (ref 30.0–36.0)
MCV: 90.6 fL (ref 78.0–100.0)
Platelets: 89 10*3/uL — ABNORMAL LOW (ref 150–400)
RBC: 3.19 MIL/uL — AB (ref 4.22–5.81)
RDW: 16.9 % — ABNORMAL HIGH (ref 11.5–15.5)
WBC: 9.3 10*3/uL (ref 4.0–10.5)

## 2014-04-02 LAB — BASIC METABOLIC PANEL
ANION GAP: 12 (ref 5–15)
BUN: 19 mg/dL (ref 6–23)
CHLORIDE: 101 meq/L (ref 96–112)
CO2: 24 meq/L (ref 19–32)
Calcium: 8 mg/dL — ABNORMAL LOW (ref 8.4–10.5)
Creatinine, Ser: 0.94 mg/dL (ref 0.50–1.35)
GFR calc Af Amer: >90 mL/min (ref >90)
GFR calc non Af Amer: 84 mL/min — ABNORMAL LOW (ref >90)
Glucose, Bld: 108 mg/dL — ABNORMAL HIGH (ref 70–99)
Potassium: 4.7 mEq/L (ref 3.7–5.3)
SODIUM: 137 meq/L (ref 137–147)

## 2014-04-02 MED ORDER — INSULIN DETEMIR 100 UNIT/ML ~~LOC~~ SOLN
20.0000 [IU] | Freq: Every day | SUBCUTANEOUS | Status: DC
  Administered 2014-04-02: 20 [IU] via SUBCUTANEOUS
  Filled 2014-04-02 (×2): qty 0.2

## 2014-04-02 MED ORDER — METOCLOPRAMIDE HCL 5 MG/ML IJ SOLN
10.0000 mg | Freq: Four times a day (QID) | INTRAMUSCULAR | Status: AC
  Administered 2014-04-02 – 2014-04-04 (×8): 10 mg via INTRAVENOUS
  Filled 2014-04-02 (×9): qty 2

## 2014-04-02 MED ORDER — GUAIFENESIN ER 600 MG PO TB12
1200.0000 mg | ORAL_TABLET | Freq: Two times a day (BID) | ORAL | Status: DC
  Administered 2014-04-02 – 2014-04-04 (×6): 1200 mg via ORAL
  Filled 2014-04-02 (×9): qty 2

## 2014-04-02 MED ORDER — FUROSEMIDE 40 MG PO TABS
40.0000 mg | ORAL_TABLET | Freq: Every day | ORAL | Status: DC
  Administered 2014-04-02 – 2014-04-04 (×3): 40 mg via ORAL
  Filled 2014-04-02 (×4): qty 1

## 2014-04-02 MED ORDER — INSULIN ASPART 100 UNIT/ML ~~LOC~~ SOLN: 0.0000 [IU] | Freq: Every day | SUBCUTANEOUS | Status: DC

## 2014-04-02 MED ORDER — INSULIN ASPART 100 UNIT/ML ~~LOC~~ SOLN
0.0000 [IU] | Freq: Three times a day (TID) | SUBCUTANEOUS | Status: DC
  Administered 2014-04-02 – 2014-04-03 (×5): 2 [IU] via SUBCUTANEOUS
  Administered 2014-04-04: 3 [IU] via SUBCUTANEOUS
  Administered 2014-04-04 (×2): 2 [IU] via SUBCUTANEOUS

## 2014-04-02 NOTE — Progress Notes (Signed)
Patient ID: [name], male   DOB: 11-May-1946, 68 y.o.   MRN: [id number]  SICU Evening Rounds:  Hemodynamically stable  Ambulated 3 times today.  Urine output ok

## 2014-04-02 NOTE — Progress Notes (Signed)
2 Days Post-Op Procedure(s) (LRB): CORONARY ARTERY BYPASS GRAFTING (CABG) times 3 using left internal mammary artery and right saphenous vein. (N/A) INTRAOPERATIVE TRANSESOPHAGEAL ECHOCARDIOGRAM (N/A) Subjective: C/o nausea, some incisional pain, productive cough   Objective: Vital signs in last 24 hours: Temp:  [97.8 F (36.6 C)-99.3 F (37.4 C)] 97.9 F (36.6 C) (08/05 0410) Pulse Rate:  [63-83] 73 (08/05 0700) Cardiac Rhythm:  [-] Normal sinus rhythm;Bundle branch block (08/05 0730) Resp:  [9-21] 13 (08/05 0700) BP: (88-128)/(44-74) 95/45 mmHg (08/05 0700) SpO2:  [88 %-100 %] 94 % (08/05 0700) Arterial Line BP: (87-145)/(41-56) 87/41 mmHg (08/04 1100) Weight:  [249 lb 14.4 oz (113.354 kg)] 249 lb 14.4 oz (113.354 kg) (08/05 0500)  Hemodynamic parameters for last 24 hours: PAP: (29)/(14) 29/14 mmHg  Intake/Output from previous day: 08/04 0701 - 08/05 0700 In: 1737.6 [P.O.:1080; I.V.:557.6; IV Piggyback:100] Out: 1430 [Urine:1400; Chest Tube:30] Intake/Output this shift:    General appearance: alert and no distress Neurologic: intact Heart: regular rate and rhythm Lungs: diminished breath sounds bibasilar Abdomen: mildly distended nontender  Lab Results:  Recent Labs  04/01/14 1615 04/01/14 1618 04/02/14 0440  WBC 10.3  --  9.3  HGB 8.6* 10.5* 8.8*  HCT 28.5* 31.0* 28.9*  PLT PLATELET CLUMPS NOTED ON SMEAR, COUNT APPEARS DECREASED  --  89*   BMET:  Recent Labs  04/01/14 0400  04/01/14 1618 04/02/14 0440  NA 137  --  134* 137  K 4.4  --  4.8 4.7  CL 104  --  99 101  CO2 23  --   --  24  GLUCOSE 113*  --  134* 108*  BUN 12  --  17 19  CREATININE 0.74  < > 1.10 0.94  CALCIUM 7.6*  --   --  8.0*  < > = values in this interval not displayed.  PT/INR:  Recent Labs  03/31/14 1245  LABPROT 17.7*  INR 1.46   ABG    Component Value Date/Time   PHART 7.332* 03/31/2014 1859   HCO3 25.2* 03/31/2014 1859   TCO2 24 04/01/2014 1618   ACIDBASEDEF 1.0 03/31/2014  1859   O2SAT 98.0 03/31/2014 1859   CBG (last 3)   Recent Labs  04/01/14 1910 04/01/14 2331 04/02/14 0359  GLUCAP 176* 140* 106*    Assessment/Plan: S/P Procedure(s) (LRB): CORONARY ARTERY BYPASS GRAFTING (CABG) times 3 using left internal mammary artery and right saphenous vein. (N/A) INTRAOPERATIVE TRANSESOPHAGEAL ECHOCARDIOGRAM (N/A) POD # 2 CV=- stable, in SR  RESP- some LLL atelectasis- mucinex, flutter, IS, prn nebs  RENAL- continue diuresis with PO lasix  ENDO- CBG well controlled, continue levemir, restart metformin tomorrow  Thrombocytopenia- new, dc lovenox, SCD for DVT prophylaxis  Increase activity   LOS: 6 days    [name] C 04/02/2014

## 2014-04-02 NOTE — Progress Notes (Signed)
Wasted 5 mg oxycodone with Lilyan Gilford, RN.  Allen Derry, RN

## 2014-04-02 NOTE — Progress Notes (Signed)
Spoke with pt regarding CPAP. Says he has one at home, but never wears it. RN said he had only wore it last night until 2300 last night. Explained to patient the importance of wearing it, and he said that he might try it later tonight.

## 2014-04-03 ENCOUNTER — Encounter (HOSPITAL_COMMUNITY): Payer: Self-pay | Admitting: Thoracic Surgery (Cardiothoracic Vascular Surgery)

## 2014-04-03 LAB — TYPE AND SCREEN
ABO/RH(D): O POS
Antibody Screen: NEGATIVE
UNIT DIVISION: 0
UNIT DIVISION: 0
Unit division: 0
Unit division: 0

## 2014-04-03 LAB — CBC
HCT: 29.2 % — ABNORMAL LOW (ref 39.0–52.0)
Hemoglobin: 9.3 g/dL — ABNORMAL LOW (ref 13.0–17.0)
MCH: 27.7 pg (ref 26.0–34.0)
MCHC: 31.8 g/dL (ref 30.0–36.0)
MCV: 86.9 fL (ref 78.0–100.0)
PLATELETS: 108 10*3/uL — AB (ref 150–400)
RBC: 3.36 MIL/uL — AB (ref 4.22–5.81)
RDW: 16.7 % — ABNORMAL HIGH (ref 11.5–15.5)
WBC: 9.2 10*3/uL (ref 4.0–10.5)

## 2014-04-03 LAB — GLUCOSE, CAPILLARY
GLUCOSE-CAPILLARY: 145 mg/dL — AB (ref 70–99)
GLUCOSE-CAPILLARY: 128 mg/dL — AB (ref 70–99)
Glucose-Capillary: 142 mg/dL — ABNORMAL HIGH (ref 70–99)

## 2014-04-03 LAB — BASIC METABOLIC PANEL
ANION GAP: 14 (ref 5–15)
BUN: 22 mg/dL (ref 6–23)
CALCIUM: 8.6 mg/dL (ref 8.4–10.5)
CO2: 23 mEq/L (ref 19–32)
Chloride: 99 mEq/L (ref 96–112)
Creatinine, Ser: 0.78 mg/dL (ref 0.50–1.35)
GFR calc Af Amer: >90 mL/min (ref >90)
Glucose, Bld: 135 mg/dL — ABNORMAL HIGH (ref 70–99)
POTASSIUM: 5 meq/L (ref 3.7–5.3)
Sodium: 136 mEq/L — ABNORMAL LOW (ref 137–147)

## 2014-04-03 MED ORDER — SODIUM CHLORIDE 0.9 % IV SOLN: 250.0000 mL | INTRAVENOUS | Status: DC | PRN

## 2014-04-03 MED ORDER — SODIUM CHLORIDE 0.9 % IJ SOLN: 3.0000 mL | INTRAMUSCULAR | Status: DC | PRN

## 2014-04-03 MED ORDER — ALPRAZOLAM 0.25 MG PO TABS: 0.2500 mg | ORAL_TABLET | Freq: Four times a day (QID) | ORAL | Status: DC | PRN

## 2014-04-03 MED ORDER — MAGNESIUM HYDROXIDE 400 MG/5ML PO SUSP: 30.0000 mL | Freq: Every day | ORAL | Status: DC | PRN

## 2014-04-03 MED ORDER — TRAMADOL HCL 50 MG PO TABS: 50.0000 mg | ORAL_TABLET | ORAL | Status: DC | PRN

## 2014-04-03 MED ORDER — MOVING RIGHT ALONG BOOK
Freq: Once | Status: AC
  Administered 2014-04-03: 17:00:00
  Filled 2014-04-03: qty 1

## 2014-04-03 MED ORDER — IPRATROPIUM-ALBUTEROL 0.5-2.5 (3) MG/3ML IN SOLN: 3.0000 mL | Freq: Four times a day (QID) | RESPIRATORY_TRACT | Status: DC | PRN

## 2014-04-03 MED ORDER — METFORMIN HCL 500 MG PO TABS
500.0000 mg | ORAL_TABLET | Freq: Two times a day (BID) | ORAL | Status: DC
  Administered 2014-04-03 – 2014-04-04 (×4): 500 mg via ORAL
  Filled 2014-04-03 (×8): qty 1

## 2014-04-03 MED ORDER — SODIUM CHLORIDE 0.9 % IJ SOLN
3.0000 mL | Freq: Two times a day (BID) | INTRAMUSCULAR | Status: DC
  Administered 2014-04-03 – 2014-04-04 (×3): 3 mL via INTRAVENOUS

## 2014-04-03 MED ORDER — ALUM & MAG HYDROXIDE-SIMETH 200-200-20 MG/5ML PO SUSP: 15.0000 mL | ORAL | Status: DC | PRN

## 2014-04-03 NOTE — Progress Notes (Signed)
Pt received into room 2w22, pt oriented to room and call bell, tele place don pt, family at bedside, pt states no pain at this time, will continue to monitor Rickard Rhymes, RN

## 2014-04-03 NOTE — Progress Notes (Signed)
3 Days Post-Op Procedure(s) (LRB): CORONARY ARTERY BYPASS GRAFTING (CABG) times 3 using left internal mammary artery and right saphenous vein. (N/A) INTRAOPERATIVE TRANSESOPHAGEAL ECHOCARDIOGRAM (N/A) Subjective: Still has some nausea, but improved. Some flatus last PM  Objective: Vital signs in last 24 hours: Temp:  [97.8 F (36.6 C)-98.2 F (36.8 C)] 98.1 F (36.7 C) (08/06 0349) Pulse Rate:  [59-80] 80 (08/06 0700) Cardiac Rhythm:  [-] Normal sinus rhythm (08/06 0700) Resp:  [11-20] 20 (08/06 0700) BP: (87-137)/(30-65) 137/63 mmHg (08/06 0700) SpO2:  [91 %-100 %] 94 % (08/06 0700) Weight:  [247 lb (112.038 kg)] 247 lb (112.038 kg) (08/06 0446)  Hemodynamic parameters for last 24 hours:    Intake/Output from previous day: 08/05 0701 - 08/06 0700 In: 280 [P.O.:240; I.V.:40] Out: 1070 [Urine:1070] Intake/Output this shift:    General appearance: alert and no distress Neurologic: intact Heart: regular rate and rhythm Lungs: diminished breath sounds bibasilar Abdomen: mildly distended, +BS  Lab Results:  Recent Labs  04/01/14 1615 04/01/14 1618 04/02/14 0440  WBC 10.3  --  9.3  HGB 8.6* 10.5* 8.8*  HCT 28.5* 31.0* 28.9*  PLT PLATELET CLUMPS NOTED ON SMEAR, COUNT APPEARS DECREASED  --  89*   BMET:  Recent Labs  04/01/14 0400  04/01/14 1618 04/02/14 0440  NA 137  --  134* 137  K 4.4  --  4.8 4.7  CL 104  --  99 101  CO2 23  --   --  24  GLUCOSE 113*  --  134* 108*  BUN 12  --  17 19  CREATININE 0.74  < > 1.10 0.94  CALCIUM 7.6*  --   --  8.0*  < > = values in this interval not displayed.  PT/INR:  Recent Labs  03/31/14 1245  LABPROT 17.7*  INR 1.46   ABG    Component Value Date/Time   PHART 7.332* 03/31/2014 1859   HCO3 25.2* 03/31/2014 1859   TCO2 24 04/01/2014 1618   ACIDBASEDEF 1.0 03/31/2014 1859   O2SAT 98.0 03/31/2014 1859   CBG (last 3)   Recent Labs  04/02/14 1152 04/02/14 1657 04/02/14 2131  GLUCAP 136* 139* 120*     Assessment/Plan: S/P Procedure(s) (LRB): CORONARY ARTERY BYPASS GRAFTING (CABG) times 3 using left internal mammary artery and right saphenous vein. (N/A) INTRAOPERATIVE TRANSESOPHAGEAL ECHOCARDIOGRAM (N/A) - CV- s/p CABG, stable  RESP=- still has diminished BS at bases c/w atelectasis- IS  RENAL- continue diuresis  ENDO- CBG well controlled  GI- nausea, likely ileus, improving  Transfer to 2000     LOS: 7 days    HENDRICKSON,STEVEN C 04/03/2014

## 2014-04-03 NOTE — Progress Notes (Signed)
Pt has refused CPAP for the night, RT to monitor and assess as needed.  

## 2014-04-03 NOTE — Addendum Note (Signed)
Addendum created 04/03/14 1602 by Roberts Gaudy, MD   Modules edited: Clinical Notes   Clinical Notes:  File: [id number]; [place]: [id number]; [place]: [id number]; [place]: [id number]; [place]: [id number]

## 2014-04-03 NOTE — Progress Notes (Signed)
Anesthesiology Follow-up:  Awake and alert, sitting in chair, walking around ICU with assistance. Transfer from ICU delayed by ileus, now resolving  VS: T-37.4 BP 135/65 HR-68 RR-18 O2 Sat 97% on RA  K-5.0 BUN/Cr. 22/0.70 glucose 135 H/H 9.3/29.2   Lovenox held for decreased platelet count.  Extubated 5 hours following surgery, plan to transfer to 2W today.  Roberts Gaudy

## 2014-04-04 ENCOUNTER — Inpatient Hospital Stay (HOSPITAL_COMMUNITY): Payer: Medicare Other

## 2014-04-04 LAB — GLUCOSE, CAPILLARY
GLUCOSE-CAPILLARY: 133 mg/dL — AB (ref 70–99)
Glucose-Capillary: 123 mg/dL — ABNORMAL HIGH (ref 70–99)
Glucose-Capillary: 156 mg/dL — ABNORMAL HIGH (ref 70–99)
Glucose-Capillary: 127 mg/dL — ABNORMAL HIGH (ref 70–99)

## 2014-04-04 LAB — BASIC METABOLIC PANEL
ANION GAP: 13 (ref 5–15)
BUN: 23 mg/dL (ref 6–23)
CO2: 23 mEq/L (ref 19–32)
CREATININE: 0.73 mg/dL (ref 0.50–1.35)
Calcium: 8.2 mg/dL — ABNORMAL LOW (ref 8.4–10.5)
Chloride: 100 mEq/L (ref 96–112)
GFR calc non Af Amer: >90 mL/min (ref >90)
Glucose, Bld: 125 mg/dL — ABNORMAL HIGH (ref 70–99)
POTASSIUM: 4.1 meq/L (ref 3.7–5.3)
Sodium: 136 mEq/L — ABNORMAL LOW (ref 137–147)

## 2014-04-04 LAB — CBC
HEMATOCRIT: 26.4 % — AB (ref 39.0–52.0)
Hemoglobin: 8.1 g/dL — ABNORMAL LOW (ref 13.0–17.0)
MCH: 26.6 pg (ref 26.0–34.0)
MCHC: 30.7 g/dL (ref 30.0–36.0)
MCV: 86.6 fL (ref 78.0–100.0)
Platelets: 127 10*3/uL — ABNORMAL LOW (ref 150–400)
RBC: 3.05 MIL/uL — ABNORMAL LOW (ref 4.22–5.81)
RDW: 16.6 % — ABNORMAL HIGH (ref 11.5–15.5)
WBC: 5.2 10*3/uL (ref 4.0–10.5)

## 2014-04-04 MED ORDER — FERROUS SULFATE 325 (65 FE) MG PO TABS: 325.0000 mg | ORAL_TABLET | Freq: Every day | ORAL | Status: DC

## 2014-04-04 MED ORDER — LACTULOSE 10 GM/15ML PO SOLN
30.0000 g | Freq: Once | ORAL | Status: AC
  Administered 2014-04-04: 30 g via ORAL
  Filled 2014-04-04: qty 45

## 2014-04-04 MED ORDER — POLYETHYLENE GLYCOL 3350 17 G PO PACK
17.0000 g | PACK | Freq: Every day | ORAL | Status: DC | PRN
  Filled 2014-04-04: qty 1

## 2014-04-04 MED ORDER — LOSARTAN POTASSIUM 25 MG PO TABS
25.0000 mg | ORAL_TABLET | Freq: Every day | ORAL | Status: DC
Start: 2014-04-04 — End: 2014-04-05
  Administered 2014-04-04: 25 mg via ORAL
  Filled 2014-04-04 (×2): qty 1

## 2014-04-04 MED ORDER — FERROUS SULFATE 325 (65 FE) MG PO TABS
325.0000 mg | ORAL_TABLET | Freq: Every day | ORAL | Status: DC
  Filled 2014-04-04 (×2): qty 1

## 2014-04-04 NOTE — Progress Notes (Signed)
Epicardial pacing wires discontinued per protocol. Pt tolerated without problems. Pt instructed on bedrest for 1 hour. Rhythm NSR, stable.

## 2014-04-04 NOTE — Progress Notes (Addendum)
      [address]       [place] [address]             [phone]        4 Days Post-Op Procedure(s) (LRB): CORONARY ARTERY BYPASS GRAFTING (CABG) times 3 using left internal mammary artery and right saphenous vein. (N/A) INTRAOPERATIVE TRANSESOPHAGEAL ECHOCARDIOGRAM (N/A)  Subjective: Eating breakfast. Patient with complaints of constipation.  Objective: Vital signs in last 24 hours: Temp:  [97.6 F (36.4 C)-99.2 F (37.3 C)] 98 F (36.7 C) (08/07 0451) Pulse Rate:  [61-106] 66 (08/07 0451) Cardiac Rhythm:  [-] Normal sinus rhythm;Sinus tachycardia (08/06 1920) Resp:  [14-20] 18 (08/07 0451) BP: (91-147)/(50-75) 114/56 mmHg (08/07 0451) SpO2:  [95 %-100 %] 96 % (08/07 0451) Weight:  [243 lb 13.3 oz (110.6 kg)] 243 lb 13.3 oz (110.6 kg) (08/07 0409)  Pre op weight 109.6 kg Current Weight  04/04/14 243 lb 13.3 oz (110.6 kg)      Intake/Output from previous day: 08/06 0701 - 08/07 0700 In: 240 [P.O.:240] Out: 425 [Urine:425]   Physical Exam:  Cardiovascular: RRR Pulmonary: Clear on the right and slightly diminished on left; no rales, wheezes, or rhonchi. Abdomen: Soft, non tender, some distention, bowel sounds present. Extremities: Trace bilateral lower extremity edema. Wounds: Clean and dry.  No erythema or signs of infection.  Lab Results: CBC: Recent Labs  04/03/14 1110 04/04/14 0410  WBC 9.2 5.2  HGB 9.3* 8.1*  HCT 29.2* 26.4*  PLT 108* 127*   BMET:  Recent Labs  04/03/14 1110 04/04/14 0410  NA 136* 136*  K 5.0 4.1  CL 99 100  CO2 23 23  GLUCOSE 135* 125*  BUN 22 23  CREATININE 0.78 0.73  CALCIUM 8.6 8.2*    PT/INR:  Lab Results  Component Value Date   INR 1.46 03/31/2014   INR 1.16 03/30/2014   INR 1.10 03/27/2014   ABG:  INR: Will add last result for INR, ABG once components are confirmed Will add last 4 CBG results once components are confirmed  Assessment/Plan:  1. CV - SR in the 70's. On Lopressor 12.5 bid.  Restart low dose ACE for better BP control 2.  Pulmonary - CXR shows improved aeration but still with atelectasis left base, small bilateral pleural effusions.Encourage incentive spirometer 3. Volume Overload - On Lasix 40 daily 4.  Acute blood loss anemia - H and H decreased to 8.1 and 26.4 this am. Start Ferrous sulfate 5.Thrombocytopenia-platelets increased to 127,000 6. DM-CBGs 142/128/123 . On Metformin 500 bid. Pre op HGA1C 5.6. 7. Remove EPW 8. LOC constipation 9. Possible discharge this weekend  ZIMMERMAN,[name] MPA-C 04/04/2014,7:42 AM  Patient seen and examined, agree with above Possibly home in next 24- 48 hours if he continues to progress

## 2014-04-04 NOTE — Progress Notes (Addendum)
Have been attempting to DC epicardial wires but pt has been having multiple BMs after laxative administration and states that he is unable to lay in the bed for an hour. Will DC wires as soon as pts bowels calm down.

## 2014-04-04 NOTE — Discharge Summary (Signed)
Physician Discharge Summary  Patient ID: [name] MRN: [id number] DOB/AGE: [birth year] 68 y.o.  Admit date: 03/27/2014 Discharge date: 04/05/2014  Admission Diagnoses:  Patient Active Problem [name]   Diagnosis Date Noted  . History of GI bleed   . Unstable angina 03/27/2014  . Cirrhosis of liver without mention of alcohol 02/27/2014  . Hyperlipidemia LDL goal < 70 06/29/2012  . Coronary artery disease    . Sleep apnea   . DIABETES MELLITUS, TYPE II-controlled 08/04/2010  . GERD 08/04/2010  . History of gout   . Hypertensive heart disease    Discharge Diagnoses:   Left main/ 3 vessel CAD with unstable angina Patient Active Problem [name]   Diagnosis Date Noted  . S/P CABG x 3 03/31/2014  . History of GI bleed   . Unstable angina 03/27/2014  . Cirrhosis of liver without mention of alcohol 02/27/2014  . Hyperlipidemia LDL goal < 70 06/29/2012  . Coronary artery disease    . Sleep apnea   . DIABETES MELLITUS, TYPE II-controlled 08/04/2010  . GERD 08/04/2010  . History of gout   . Hypertensive heart disease    Discharged Condition: good  History of Present Illness:   [name] is a 68 yo white male with history of Hypertension, Type II DM, Hyperlipidemia, tobacco abuse.  He also has a known history of CAD.  He is S/P PCI with DES to the LAD in 2013.  He was recently admitted for a GI bleed at which time his ASA and effient were discontinued.  Over the past month the patient developed exertional chest pain.  The patient states this has been occuring almost daily but was relieved with rest and SL Nitroglycerin.  The patient presented to the ED on 03/27/2014 with complaints of chest pain that occurred with ambulation.  He states the pain was severe which made him present for evaluation.  The patient states this is similar to his previous angina.  Workup in the ED was unremarkable, however it was felt he should be observed for observation and Cardiology consult was obtained.    Hospital Course:   The patient was chest pain free on admission.  His cardiac enzymes also remained negative, however due to his previous cardiac history and unstable angina it was felt he should undergo cardiac catheterization.  This was performed on 03/28/2014 and showed multivessel CAD with severe distal LM involvement.  It was felt Coronary Bypass Grafting would be indicated and [facility] was consulted.  He was evaluated by Dr. Roxan Hockey who felt Coronary Bypass Grafting would be indicated.  The risks and benefits of the procedure were explained to the patient and he was agreeable to proceed.  The patient was taken to the operating room on 03/31/2014.  He underwent CABG x 3 utilizing LIMA to LAD, SVG to OM, and SVG to PDA.  He also underwent Endoscopic Saphenous Vein Harvest from his right thigh.  He tolerated the procedure well and was taken to the SICU in stable condition.  He was extubated the evening of surgery.  During his stay in the SICU the patient was weaned off Neo Synephrine as tolerated.  His chest tubes and arterial lines were removed without difficulty.  His blood sugars were well controlled and he was transitioned off his regimen of insulin and restarted on his home Metformin.  The patient complained of persistent nausea which was felt to most likely be an Ileus which has slowly improved.  He was maintaining NSR and ambulating without  difficulty.  He was felt to be medically stable and transferred to the step down unit in stable condition.   The patient continues to progress.  He was hypertensive and restarted on his home Cozaar.  He continues to maintain NSR and his pacing wires were removed without difficulty.  The patient has continued atelectasis on CXR which is improving, and he has been encouraged to continue to use his incentive spirometer.  He is ambulating without difficulty.  He is tolerating a carb modified diet.  He had a bowel movement 8/7. He is felt surgically stable for discharge  today.         Significant Diagnostic Studies: angiography:   HEMODYNAMICS: Aortic pressure 119/59 mmHg; LV pressure 126/4 mmHg; LVEDP 21 mm mercury  ANGIOGRAPHIC DATA: The left main coronary artery is heavily calcified. There is 30% proximal stenosis. An 70-80% distal left main stenosis.  The left anterior descending artery is patent, contains a proximal stent, and an eccentric ostial 60-70% narrowing. The distal vessel is large.  The left circumflex artery is calcified with ostial 90%, hazy stenosis. The mid vessel contains segmental 80-90% narrowing before ending, trifurcating obtuse marginal.  The right coronary artery is heavy proximal to mid calcification. There is proximal and mid 50% narrowing. Large PDA and left ventricular branch arises distally.Marland Kitchen  LEFT VENTRICULOGRAM: Left ventricular angiogram was done in the 30 RAO projection and revealed a cavity that is upper limit of normal in size. Contractility is normal with an EF of 60%.  Treatments: surgery:   Median sternotomy, extracorporeal circulation, coronary artery bypass grafting x3 (left internal mammary artery to left anterior descending, saphenous vein graft to obtuse marginal 1, saphenous vein graft to posterior descending), endoscopic vein harvest to the right leg.  Disposition: 01-Home or Self Care   Discharge Medications:    Medication [name]    STOP taking these medications       nitroGLYCERIN 0.4 MG SL tablet  Commonly known as:  NITROSTAT      TAKE these medications       aspirin 81 MG chewable tablet  Chew 1 tablet (81 mg total) by mouth daily.     atorvastatin 80 MG tablet  Commonly known as:  LIPITOR  Take 80 mg by mouth daily.     esomeprazole 20 MG capsule  Commonly known as:  NEXIUM  Take 20 mg by mouth 2 (two) times daily.     ferrous sulfate 325 (65 FE) MG tablet  Take 325 mg by mouth 2 (two) times daily with a meal.     furosemide 20 MG tablet  Commonly known as:  LASIX  Take 1 tablet (20 mg  total) by mouth 2 (two) times daily. For 5 days then stop.     losartan 25 MG tablet  Commonly known as:  COZAAR  Take 1 tablet (25 mg total) by mouth daily.     metFORMIN 500 MG tablet  Commonly known as:  GLUCOPHAGE  Take 500 mg by mouth 2 (two) times daily with a meal.     metoprolol tartrate 12.5 mg Tabs tablet  Commonly known as:  LOPRESSOR  Take 0.5 tablets (12.5 mg total) by mouth 2 (two) times daily.     oxyCODONE 5 MG immediate release tablet  Commonly known as:  Oxy IR/ROXICODONE  Take 1-2 tablets (5-10 mg total) by mouth every 4 (four) hours as needed for severe pain.     Potassium Chloride ER 20 MEQ Tbcr  Take 20 mEq by  mouth daily. For 5 days then stop.       The patient has been discharged on:   1.Beta Blocker:  Yes [x   ]                              No   [   ]                              If No, reason:  2.Ace Inhibitor/ARB: Yes [ x  ]                                     No  [    ]                                     If No, reason:  3.Statin:   Yes [x   ]                  No  [   ]                  If No, reason:  4.Ecasa:  Yes  [x   ]                  No   [   ]                  If No, reason:      Follow-up Information   Follow up with Melrose Nakayama, MD On 05/06/2014. (Appointment is at 12:00)    Specialty:  Cardiothoracic Surgery   Contact information:   [address] [place] [address] [phone]       Follow up with [facility] On 05/06/2014. (Please get CXR at 11:00)    Contact information:   [place]       Signed: ZIMMERMAN,[name] M PA-C 04/05/2014, 9:04 AM

## 2014-04-04 NOTE — Discharge Instructions (Signed)
Coronary Artery Bypass Grafting, Care After °Refer to this sheet in the next few weeks. These instructions provide you with information on caring for yourself after your procedure. Your health care provider may also give you more specific instructions. Your treatment has been planned according to current medical practices, but problems sometimes occur. Call your health care provider if you have any problems or questions after your procedure. °WHAT TO EXPECT AFTER THE PROCEDURE °Recovery from surgery will be different for everyone. Some people feel well after 3 or 4 weeks, while for others it takes longer. After your procedure, it is typical to have the following: °· Nausea and a lack of appetite.   °· Constipation. °· Weakness and fatigue.   °· Depression or irritability.   °· Pain or discomfort at your incision site. °HOME CARE INSTRUCTIONS °· Take medicines only as directed by your health care provider. Do not stop taking medicines or start any new medicines without first checking with your health care provider. °· Take your pulse as directed by your health care provider. °· Perform deep breathing as directed by your health care provider. If you were given a device called an incentive spirometer, use it to practice deep breathing several times a day. Support your chest with a pillow or your arms when you take deep breaths or cough. °· Keep incision areas clean, dry, and protected. Remove or change any bandages (dressings) only as directed by your health care provider. You may have skin adhesive strips over the incision areas. Do not take the strips off. They will fall off on their own. °· Check incision areas daily for any swelling, redness, or drainage. °· If incisions were made in your legs, do the following: °¨ Avoid crossing your legs.   °¨ Avoid sitting for long periods of time. Change positions every 30 minutes.   °¨ Elevate your legs when you are sitting. °· Wear compression stockings as directed by your  health care provider. These stockings help keep blood clots from forming in your legs. °· Take showers once your health care provider approves. Until then, only take sponge baths. Pat incisions dry. Do not rub incisions with a washcloth or towel. Do not take baths, swim, or use a hot tub until your health care provider approves. °· Eat foods that are high in fiber, such as raw fruits and vegetables, whole grains, beans, and nuts. Meats should be lean cut. Avoid canned, processed, and fried foods. °· Drink enough fluid to keep your urine clear or pale yellow. °· Weigh yourself every day. This helps identify if you are retaining fluid that may make your heart and lungs work harder. °· Rest and limit activity as directed by your health care provider. You may be instructed to: °¨ Stop any activity at once if you have chest pain, shortness of breath, irregular heartbeats, or dizziness. Get help right away if you have any of these symptoms. °¨ Move around frequently for short periods or take short walks as directed by your health care provider. Increase your activities gradually. You may need physical therapy or cardiac rehabilitation to help strengthen your muscles and build your endurance. °¨ Avoid lifting, pushing, or pulling anything heavier than 10 lb (4.5 kg) for at least 6 weeks after surgery. °· Do not drive until your health care provider approves.  °· Ask your health care provider when you may return to work. °· Ask your health care provider when you may resume sexual activity. °· Keep all follow-up visits as directed by your health care   provider. This is important. SEEK MEDICAL CARE IF:  You have swelling, redness, increasing pain, or drainage at the site of an incision.  You have a fever.  You have swelling in your ankles or legs.  You have pain in your legs.   You gain 2 or more pounds (0.9 kg) a day.  You are nauseous or vomit.  You have diarrhea. SEEK IMMEDIATE MEDICAL CARE IF:  You have  chest pain that goes to your jaw or arms.  You have shortness of breath.   You have a fast or irregular heartbeat.   You notice a "clicking" in your breastbone (sternum) when you move.   You have numbness or weakness in your arms or legs.  You feel dizzy or light-headed.  MAKE SURE YOU:  Understand these instructions.  Will watch your condition.  Will get help right away if you are not doing well or get worse. Document Released: 03/04/2005 Document Revised: 12/30/2013 Document Reviewed: 01/22/2013 [facility] Patient Information 2015 [place], [place]. This information is not intended to replace advice given to you by your health care provider. Make sure you discuss any questions you have with your health care provider.  Endoscopic Saphenous Vein Harvesting Care After Refer to this sheet in the next few weeks. These instructions provide you with information on caring for yourself after your procedure. Your health care provider may also give you more specific instructions. Your treatment has been planned according to current medical practices, but problems sometimes occur. Call your health care provider if you have any problems or questions after your procedure. HOME CARE INSTRUCTIONS Medicine  Take whatever pain medicine your surgeon prescribes. Follow the directions carefully. Do not take over-the-counter pain medicine unless your surgeon says it is okay. Some pain medicine can cause bleeding problems for several weeks after surgery.  Follow your surgeon's instructions about driving. You will probably not be permitted to drive after heart surgery.  Take any medicines your surgeon prescribes. Any medicines you took before your heart surgery should be checked with your health care provider before you start taking them again. Wound care  If your surgeon has prescribed an elastic bandage or stocking, ask how long you should wear it.  Check the area around your surgical cuts  (incisions) whenever your bandages (dressings) are changed. Look for any redness or swelling.  You will need to return to have the stitches (sutures) or staples taken out. Ask your surgeon when to do that.  Ask your surgeon when you can shower or bathe. Activity  Try to keep your legs raised when you are sitting.  Do any exercises your health care providers have given you. These may include deep breathing exercises, coughing, walking, or other exercises. SEEK MEDICAL CARE IF:  You have any questions about your medicines.  You have more leg pain, especially if your pain medicine stops working.  New or growing bruises develop on your leg.  Your leg swells, feels tight, or becomes red.  You have numbness in your leg. SEEK IMMEDIATE MEDICAL CARE IF:  Your pain gets much worse.  Blood or fluid leaks from any of the incisions.  Your incisions become warm, swollen, or red.  You have chest pain.  You have trouble breathing.  You have a fever.  You have more pain near your leg incision. MAKE SURE YOU:  Understand these instructions.  Will watch your condition.  Will get help right away if you are not doing well or get worse. Document Released: 04/27/2011  Document Revised: 08/20/2013 Document Reviewed: 04/27/2011 [facility] Patient Information 2015 [place], [place]. This information is not intended to replace advice given to you by your health care provider. Make sure you discuss any questions you have with your health care provider.

## 2014-04-04 NOTE — Progress Notes (Signed)
Pt ambulated 500 ft with rolling walker. He tolerated it well.

## 2014-04-04 NOTE — Progress Notes (Signed)
CARDIAC REHAB PHASE I   PRE:  Rate/Rhythm: 87 SR  BP:  Supine:   Sitting: 140/70  Standing:    SaO2: 95%RA  MODE:  Ambulation: 350 ft   POST:  Rate/Rhythm: 89  BP:  Supine:   Sitting: 160/70  Standing:    SaO2: 95%RA [id number] Pt walked 350 ft on RA with rolling walker, stopping twice to rest. Not feeling well. Tired by end of walk. C.o stomach not feeling good. Notified pt's RN that pt would like rolling walker for home use. Education completed with pt and wife. Encouraged IS and walks for pulmonary improvement. Discussed CRP 2 and pt gave permission to refer to [employer] [facility] [employer]. Reviewed heart healthy tips and gave diabetic diet. Pt's wife stated that son does most of cooking and will read at home. Encouraged them to watch post op video later when pt in bed for pacing wire removal.   Graylon Good, RN BSN  04/04/2014 10:29 AM

## 2014-04-04 NOTE — Progress Notes (Signed)
Ambulated 400 feet using rolling walker on room air tolerated well

## 2014-04-05 LAB — GLUCOSE, CAPILLARY: GLUCOSE-CAPILLARY: 108 mg/dL — AB (ref 70–99)

## 2014-04-05 MED ORDER — ASPIRIN 325 MG PO TBEC: 325.0000 mg | DELAYED_RELEASE_TABLET | Freq: Every day | ORAL | Status: DC

## 2014-04-05 MED ORDER — POTASSIUM CHLORIDE CRYS ER 20 MEQ PO TBCR: 20.0000 meq | EXTENDED_RELEASE_TABLET | Freq: Once | ORAL | Status: DC

## 2014-04-05 MED ORDER — OXYCODONE HCL 5 MG PO TABS: 5.0000 mg | ORAL_TABLET | ORAL | Status: DC | PRN

## 2014-04-05 MED ORDER — POTASSIUM CHLORIDE ER 20 MEQ PO TBCR: 20.0000 meq | EXTENDED_RELEASE_TABLET | Freq: Every day | ORAL | Status: DC

## 2014-04-05 MED ORDER — FUROSEMIDE 20 MG PO TABS: 20.0000 mg | ORAL_TABLET | Freq: Two times a day (BID) | ORAL | Status: DC

## 2014-04-05 MED ORDER — METOPROLOL TARTRATE 12.5 MG HALF TABLET: 12.5000 mg | ORAL_TABLET | Freq: Two times a day (BID) | ORAL | Status: DC

## 2014-04-05 NOTE — Progress Notes (Signed)
Discharged to home with family office visits in place teaching done  

## 2014-04-05 NOTE — Progress Notes (Signed)
      [address]       [place] [address]             [phone]        5 Days Post-Op Procedure(s) (LRB): CORONARY ARTERY BYPASS GRAFTING (CABG) times 3 using left internal mammary artery and right saphenous vein. (N/A) INTRAOPERATIVE TRANSESOPHAGEAL ECHOCARDIOGRAM (N/A)  Subjective: Patient with bowel movement. He wants to go home.  Objective: Vital signs in last 24 hours: Temp:  [98.2 F (36.8 C)-98.6 F (37 C)] 98.6 F (37 C) (08/08 0444) Pulse Rate:  [69-80] 78 (08/08 0444) Cardiac Rhythm:  [-] Normal sinus rhythm (08/07 1920) Resp:  [18-19] 19 (08/08 0444) BP: (110-134)/(53-65) 126/61 mmHg (08/08 0444) SpO2:  [96 %-98 %] 96 % (08/08 0444) Weight:  [241 lb 10 oz (109.6 kg)] 241 lb 10 oz (109.6 kg) (08/08 0444)  Pre op weight 109.6 kg Current Weight  04/05/14 241 lb 10 oz (109.6 kg)      Intake/Output from previous day: 08/07 0701 - 08/08 0700 In: 600 [P.O.:600] Out: -    Physical Exam:  Cardiovascular: RRR Pulmonary: Clear on the right and slightly diminished on left base; no rales, wheezes, or rhonchi. Abdomen: Soft, non tender, some distention, bowel sounds present. Extremities: Trace bilateral lower extremity edema. Wounds: Clean and dry.  No erythema or signs of infection.  Lab Results: CBC:  Recent Labs  04/03/14 1110 04/04/14 0410  WBC 9.2 5.2  HGB 9.3* 8.1*  HCT 29.2* 26.4*  PLT 108* 127*   BMET:   Recent Labs  04/03/14 1110 04/04/14 0410  NA 136* 136*  K 5.0 4.1  CL 99 100  CO2 23 23  GLUCOSE 135* 125*  BUN 22 23  CREATININE 0.78 0.73  CALCIUM 8.6 8.2*    PT/INR:  Lab Results  Component Value Date   INR 1.46 03/31/2014   INR 1.16 03/30/2014   INR 1.10 03/27/2014   ABG:  INR: Will add last result for INR, ABG once components are confirmed Will add last 4 CBG results once components are confirmed  Assessment/Plan:  1. CV - SR in the 70's. On Lopressor 12.5 bid and Cozaar 25 daily. 2.  Pulmonary -  Encourage incentive spirometer 3. Volume Overload - On Lasix 40 daily. Will continue for several days post op then stop. 4.  Acute blood loss anemia - H and H decreased to 8.1 and 26.4 this am. Restart Ferrous sulfate. 5.Thrombocytopenia-platelets increased to 127,000 6. DM-CBGs 133/127/108. On Metformin 500 bid. Pre op HGA1C 5.6. 9. Discharge   ZIMMERMAN,[name] MPA-C 04/05/2014,7:38 AM

## 2014-04-18 ENCOUNTER — Encounter: Payer: Self-pay | Admitting: Internal Medicine

## 2014-04-18 ENCOUNTER — Ambulatory Visit (INDEPENDENT_AMBULATORY_CARE_PROVIDER_SITE_OTHER): Payer: Medicare Other | Admitting: Internal Medicine

## 2014-04-18 ENCOUNTER — Other Ambulatory Visit (INDEPENDENT_AMBULATORY_CARE_PROVIDER_SITE_OTHER): Payer: Medicare Other

## 2014-04-18 VITALS — BP 106/60 | HR 63 | Temp 98.3°F | Wt 250.0 lb

## 2014-04-18 DIAGNOSIS — E119 Type 2 diabetes mellitus without complications: Secondary | ICD-10-CM

## 2014-04-18 DIAGNOSIS — I200 Unstable angina: Secondary | ICD-10-CM

## 2014-04-18 DIAGNOSIS — R609 Edema, unspecified: Secondary | ICD-10-CM

## 2014-04-18 DIAGNOSIS — R209 Unspecified disturbances of skin sensation: Secondary | ICD-10-CM

## 2014-04-18 DIAGNOSIS — Z951 Presence of aortocoronary bypass graft: Secondary | ICD-10-CM

## 2014-04-18 DIAGNOSIS — R202 Paresthesia of skin: Secondary | ICD-10-CM

## 2014-04-18 DIAGNOSIS — G4733 Obstructive sleep apnea (adult) (pediatric): Secondary | ICD-10-CM

## 2014-04-18 NOTE — Progress Notes (Signed)
   Subjective:    Patient ID: [name], male    DOB: 07-18-1946, 68 y.o.   MRN: [id number]  HPI    He describes numbness at the right medial malleolar area since 04/06/14. This is in the context of increased swelling of the lower extremities ,worse in the right lower extremity than the left since he had triple bypass surgery. The right leg was site of the venous donation.  He is diabetic but his most recent A1c was 5.6% on 02/27/14. He is on metformin 500 mg twice a day. He has not had numbness, tingling, burning in feet prior to the onset of progressive edema.  He is unsure of his glucoses but believes they have ranged from 130-172.  He has continued his diuretic twice a day. He does not add salt to his food.  He has sleep apnea but has been intolerant to the mask.  He also has a history of cirrhosis of the  liver.   Review of Systems    Chest pain, palpitations, tachycardia, exertional dyspnea, paroxysmal nocturnal dyspnea, or  claudication  are absent.        Objective:   Physical Exam  Pertinent positive findings include: Operative scar is well-healed. He has minimal rales without increased work of breathing. He has 1+ pitting edema right lower extremity. There is trace edema of the left. Pedal pulses are decreased because of edema. He has stasis hyperpigmentation changes of ankles. There is a large verrucoid lesion of the right superior ankle. Abdomen is massively protuberant.  General appearance :adequately nourished; in no distress. Eyes: No conjunctival inflammation or scleral icterus is present. Heart:  Normal rate and regular rhythm. S1 and S2 normal without gallop, murmur, click, rub or other extra sounds   Abdomen: bowel sounds normal, soft and non-tender without masses, or organomegaly noted.  No guarding or rebound.  Skin:Warm & dry.  Intact without suspicious lesions or rashes ; no jaundice or tenting Lymphatic: No lymphadenopathy is noted about the head,  neck, axilla                Assessment & Plan:  #1 progressive asymmetric edema since bypass surgery  #2 sleep apnea, untreated  #3 history of diabetes; A1c is in the nondiabetic range at this time  Plan: BNP will be checked.  I hesitate to add an agent such as gabapentin as I believe the very localized numbness is related to his edema rather than to diabetes.  I am concerned that his untreated sleep apnea may be playing a role in the dramatic edema.

## 2014-04-18 NOTE — Progress Notes (Signed)
Pre visit review using our clinic review tool, if applicable. No additional management support is needed unless otherwise documented below in the visit note. 

## 2014-04-18 NOTE — Patient Instructions (Signed)
Eat a low-fat diet with lots of fruits and vegetables, up to 7-9 servings per day. Consume less than 40* Grams (preferably ZERO) of sugar per day from foods & drinks with High Fructose Corn Syrup (HFCS) sugar as #1,2,3 or # 4 on label.Whole Foods, [employer] & [employer] do not carry products with HFCS. Follow a  low carb nutrition program such as [place] or The New Sugar Busters  to prevent Diabetes progression . White carbohydrates (potatoes, rice, bread, and pasta) have a high spike of sugar and a high load of sugar. For example a  baked potato has a cup of sugar and a  french fry  2 teaspoons of sugar. Yams, wild  rice, whole grained bread &  wheat pasta have been much lower spike and load of  sugar. Portions should be the size of a deck of cards or your palm. 

## 2014-04-19 LAB — HEPATIC FUNCTION PANEL
ALT: 18 U/L (ref 0–53)
AST: 26 U/L (ref 0–37)
Albumin: 3.4 g/dL — ABNORMAL LOW (ref 3.5–5.2)
Alkaline Phosphatase: 108 U/L (ref 39–117)
BILIRUBIN TOTAL: 0.4 mg/dL (ref 0.2–1.2)
Bilirubin, Direct: 0.1 mg/dL (ref 0.0–0.3)
Total Protein: 6.6 g/dL (ref 6.0–8.3)

## 2014-04-19 LAB — BASIC METABOLIC PANEL
BUN: 13 mg/dL (ref 6–23)
CALCIUM: 8.6 mg/dL (ref 8.4–10.5)
CO2: 28 mEq/L (ref 19–32)
CREATININE: 0.7 mg/dL (ref 0.4–1.5)
Chloride: 103 mEq/L (ref 96–112)
GFR: 125.34 mL/min (ref >60.00)
Glucose, Bld: 93 mg/dL (ref 70–99)
Potassium: 4.3 mEq/L (ref 3.5–5.1)
Sodium: 137 mEq/L (ref 135–145)

## 2014-04-19 LAB — BRAIN NATRIURETIC PEPTIDE: Pro B Natriuretic peptide (BNP): 164 pg/mL — ABNORMAL HIGH (ref 0.0–100.0)

## 2014-04-30 ENCOUNTER — Other Ambulatory Visit: Payer: Self-pay | Admitting: Thoracic Surgery (Cardiothoracic Vascular Surgery)

## 2014-04-30 DIAGNOSIS — I200 Unstable angina: Secondary | ICD-10-CM

## 2014-05-06 ENCOUNTER — Encounter: Payer: Self-pay | Admitting: Thoracic Surgery (Cardiothoracic Vascular Surgery)

## 2014-05-06 ENCOUNTER — Ambulatory Visit
Admission: RE | Admit: 2014-05-06 | Discharge: 2014-05-06 | Disposition: A | Discharge status: Home / Self Care | Payer: Medicare Other | Source: Ambulatory Visit | Attending: Thoracic Surgery (Cardiothoracic Vascular Surgery) | Admitting: Thoracic Surgery (Cardiothoracic Vascular Surgery)

## 2014-05-06 ENCOUNTER — Ambulatory Visit (INDEPENDENT_AMBULATORY_CARE_PROVIDER_SITE_OTHER): Payer: Self-pay | Admitting: Thoracic Surgery (Cardiothoracic Vascular Surgery)

## 2014-05-06 VITALS — BP 118/66 | HR 60 | Ht 68.0 in | Wt 250.0 lb

## 2014-05-06 DIAGNOSIS — Z951 Presence of aortocoronary bypass graft: Secondary | ICD-10-CM

## 2014-05-06 DIAGNOSIS — I200 Unstable angina: Secondary | ICD-10-CM

## 2014-05-06 DIAGNOSIS — I2510 Atherosclerotic heart disease of native coronary artery without angina pectoris: Secondary | ICD-10-CM

## 2014-05-06 MED ORDER — POTASSIUM CHLORIDE ER 20 MEQ PO TBCR: 20.0000 meq | EXTENDED_RELEASE_TABLET | Freq: Every day | ORAL | Status: DC

## 2014-05-06 MED ORDER — METOPROLOL TARTRATE 12.5 MG HALF TABLET: 12.5000 mg | ORAL_TABLET | Freq: Two times a day (BID) | ORAL | Status: DC

## 2014-05-06 MED ORDER — FUROSEMIDE 20 MG PO TABS: 40.0000 mg | ORAL_TABLET | Freq: Two times a day (BID) | ORAL | Status: DC

## 2014-05-06 NOTE — Progress Notes (Signed)
HPI:  [name] is a 68 year old gentleman who underwent coronary bypass grafting x3 on August 3 for left main and three-vessel disease. His postoperative course was uncomplicated he was discharged home on postoperative day #5. Since discharge he's had some trouble with swelling in his legs. That has gotten worse over the past several days. He has also had some numbness in his right ankle particularly on the medial aspect of the ankle. That is improving. He is not taking any narcotics for pain.  Past Medical History  Diagnosis Date  . Unspecified essential hypertension   . Coronary artery disease     a. 05/2012 Cath/PCI: LM 40ost, 50-60d, LAD 99p ruptured plaque (3.0x28 DES), LCX 50p/m, RCA 30-40p, 11m, EF 65-70%.  . Type 2 diabetes mellitus   . GERD (gastroesophageal reflux disease)   . Phimosis     a. s/p circumcision 2015.  Marland Kitchen Hyperlipidemia   . History of concussion     1976--  NO RESIDUAL  . Iron deficiency anemia   . History of GI bleed     a. UGIB 07/2012;  b. 01/2014 admission with GIB/FOB stool req 1U prbc's->EGD showed portal gastropathy, barrett's esoph, and chronic active h. pylori gastritis.  Marland Kitchen History of gout     2007 &  2008  LEFT LEG-- NO ISSUE SINCE  . Chronic low back pain   . OSA (obstructive sleep apnea)     PULMOLOGIST-  DR CLANCE--  MODERATE OSA  STARTED CPAP 2012--  BUT CURRENTLY HAS NOT USED PAST 6 MONTHS  . Hepatic cirrhosis     a. Dx 01/2014 - CT a/p   . Kidney stones   . Asthma   . History of blood transfusion     "related to bleeding ulcers"  . Arthritis     "joints tighten up sometimes" (03/27/2104)  . OA (osteoarthritis of spine)     LOWER BACK--  INTERMITTANT LEFT LEG NUMBNESS  . Chronic lower back pain       Current Outpatient Prescriptions  Medication Sig Dispense Refill  . aspirin 81 MG chewable tablet Chew 1 tablet (81 mg total) by mouth daily.      Marland Kitchen atorvastatin (LIPITOR) 80 MG tablet Take 80 mg by mouth daily.      Marland Kitchen esomeprazole (NEXIUM)  20 MG capsule Take 20 mg by mouth 2 (two) times daily.      . ferrous sulfate 325 (65 FE) MG tablet Take 325 mg by mouth 2 (two) times daily with a meal.       . losartan (COZAAR) 25 MG tablet Take 1 tablet (25 mg total) by mouth daily.  90 tablet  0  . metFORMIN (GLUCOPHAGE) 500 MG tablet Take 500 mg by mouth 2 (two) times daily with a meal.      . metoprolol tartrate (LOPRESSOR) 12.5 mg TABS tablet Take 0.5 tablets (12.5 mg total) by mouth 2 (two) times daily.  60 tablet  3  . furosemide (LASIX) 20 MG tablet Take 2 tablets (40 mg total) by mouth 2 (two) times daily. For 5 days then stop.  60 tablet  3  . oxyCODONE (OXY IR/ROXICODONE) 5 MG immediate release tablet Take 1-2 tablets (5-10 mg total) by mouth every 4 (four) hours as needed for severe pain.  30 tablet  0  . Potassium Chloride ER 20 MEQ TBCR Take 20 mEq by mouth daily. For 5 days then stop.  30 tablet  3   No current facility-administered medications for this visit.  Physical Exam BP 118/66  Pulse 60  Ht 5\' 8"  (1.727 m)  Wt 250 lb (113.399 kg)  BMI 38.02 kg/m2  SpO47 82% 68 year old male in no acute distress No focal neurologic deficits Lungs clear with equal breath sounds bilaterally Cardiac regular rate and rhythm normal S1 and S2 Sternum stable, incision clean dry and intact Leg incision clean 2-3+ pitting edema both lower extremities  Diagnostic Tests: CHEST 2 VIEW  COMPARISON: 04/04/2014  FINDINGS:  The heart size and mediastinal contours are within normal limits.  Both lungs are clear. The visualized skeletal structures are  unremarkable. Postsurgical changes are noted.  IMPRESSION:  No active cardiopulmonary disease.  Electronically Signed  By: Inez Catalina M.D.  On: 05/06/2014 13:05    Impression: 68 year old gentleman who now about a month post coronary bypass grafting x3 for left main and three-vessel disease. Overall he is doing well. The one exception is that he has a lot of peripheral edema. He says  that he had a lot of swelling preoperatively, but that it has worsened postoperatively. He has been on Lasix 20 mg twice a day. I am going to increase him to 40 mg twice a day. I also put him back on potassium 20 mEq daily. I instructed him to elevate his legs with possible and to wear support stockings as well.  He is not to lift over 10 pounds for the next 2 weeks or 20 pounds for the next 4 weeks.  He does not drive.  He was encouraged to gradually increase his physical activities as tolerated.  Plan:  Increase Lasix to 40 mg by mouth twice a day, restart potassium 20 mEq daily  Support stockings  Return in 2 weeks to check on progress

## 2014-05-14 ENCOUNTER — Encounter: Payer: Self-pay | Admitting: Physician Assistant

## 2014-05-14 ENCOUNTER — Ambulatory Visit (INDEPENDENT_AMBULATORY_CARE_PROVIDER_SITE_OTHER): Payer: Medicare Other | Admitting: Physician Assistant

## 2014-05-14 VITALS — BP 114/60 | HR 68 | Ht 68.0 in | Wt 248.4 lb

## 2014-05-14 DIAGNOSIS — I200 Unstable angina: Secondary | ICD-10-CM

## 2014-05-14 DIAGNOSIS — I2510 Atherosclerotic heart disease of native coronary artery without angina pectoris: Secondary | ICD-10-CM

## 2014-05-14 DIAGNOSIS — I25119 Atherosclerotic heart disease of native coronary artery with unspecified angina pectoris: Secondary | ICD-10-CM

## 2014-05-14 DIAGNOSIS — R609 Edema, unspecified: Secondary | ICD-10-CM | POA: Insufficient documentation

## 2014-05-14 DIAGNOSIS — I209 Angina pectoris, unspecified: Secondary | ICD-10-CM

## 2014-05-14 MED ORDER — FUROSEMIDE 40 MG PO TABS: 40.0000 mg | ORAL_TABLET | Freq: Every day | ORAL | Status: DC

## 2014-05-14 NOTE — Progress Notes (Signed)
Primary Physician: Gwendolyn Grant, MD Primary Cardiologist: Joaquim Nam, MD     HPI: This is a 68 year old male patient of Dr.Nahser who has history of CAD status post drug-eluting stent to the LAD in 2013 with a recent admission for GI bleed in June 2015 at which time his aspirin and Effient were discontinued. He presented with unstable angina and was found to have three-vessel CAD with severe distal left main involvement. He underwent CABG x3 with a LIMA to the LAD SVG to the OM and SVG to the PDA on 03/31/14. EF was 60% at cath. Since discharge he has had trouble with edema and his Lasix was increased to 40 mg twice a day by Dr. Roxan Hockey. Unfortunately the patient misunderstood this and is finishing up his 20 mg Lasix twice a day before increasing it. He still has a lot of swelling. He complains of some soreness in his chest but overall is doing well. He he has not been contacted by cardiac rehabilitation yet.  The patient also has history of hypertension, type 2 diabetes mellitus, hyperlipidemia, and tobacco abuse.  No Known Allergies   Current Outpatient Prescriptions  Medication Sig Dispense Refill  . aspirin 81 MG chewable tablet Chew 1 tablet (81 mg total) by mouth daily.      Marland Kitchen atorvastatin (LIPITOR) 80 MG tablet Take 80 mg by mouth daily.      Marland Kitchen esomeprazole (NEXIUM) 20 MG capsule Take 20 mg by mouth 2 (two) times daily.      . ferrous sulfate 325 (65 FE) MG tablet Take 325 mg by mouth 2 (two) times daily with a meal.       . furosemide (LASIX) 20 MG tablet Take 2 tablets (40 mg total) by mouth 2 (two) times daily. For 5 days then stop.  60 tablet  3  . losartan (COZAAR) 25 MG tablet Take 1 tablet (25 mg total) by mouth daily.  90 tablet  0  . metFORMIN (GLUCOPHAGE) 500 MG tablet Take 500 mg by mouth 2 (two) times daily with a meal.      . metoprolol tartrate (LOPRESSOR) 12.5 mg TABS tablet Take 0.5 tablets (12.5 mg total) by mouth 2 (two) times daily.  60 tablet  3  .  oxyCODONE (OXY IR/ROXICODONE) 5 MG immediate release tablet Take 1-2 tablets (5-10 mg total) by mouth every 4 (four) hours as needed for severe pain.  30 tablet  0  . Potassium Chloride ER 20 MEQ TBCR Take 20 mEq by mouth daily. For 5 days then stop.  30 tablet  3   No current facility-administered medications for this visit.    Past Medical History  Diagnosis Date  . Unspecified essential hypertension   . Coronary artery disease     a. 05/2012 Cath/PCI: LM 40ost, 50-60d, LAD 99p ruptured plaque (3.0x28 DES), LCX 50p/m, RCA 30-40p, 87m, EF 65-70%.  . Type 2 diabetes mellitus   . GERD (gastroesophageal reflux disease)   . Phimosis     a. s/p circumcision 2015.  Marland Kitchen Hyperlipidemia   . History of concussion     1976--  NO RESIDUAL  . Iron deficiency anemia   . History of GI bleed     a. UGIB 07/2012;  b. 01/2014 admission with GIB/FOB stool req 1U prbc's->EGD showed portal gastropathy, barrett's esoph, and chronic active h. pylori gastritis.  Marland Kitchen History of gout     2007 &  2008  LEFT LEG-- NO ISSUE SINCE  . Chronic low back  pain   . OSA (obstructive sleep apnea)     PULMOLOGIST-  DR CLANCE--  MODERATE OSA  STARTED CPAP 2012--  BUT CURRENTLY HAS NOT USED PAST 6 MONTHS  . Hepatic cirrhosis     a. Dx 01/2014 - CT a/p   . Kidney stones   . Asthma   . History of blood transfusion     "related to bleeding ulcers"  . Arthritis     "joints tighten up sometimes" (03/27/2104)  . OA (osteoarthritis of spine)     LOWER BACK--  INTERMITTANT LEFT LEG NUMBNESS  . Chronic lower back pain     Past Surgical History  Procedure Laterality Date  . Esophagogastroduodenoscopy  08/15/2012    Procedure: ESOPHAGOGASTRODUODENOSCOPY (EGD);  Surgeon: Wonda Horner, MD;  Location: [facility] ENDOSCOPY;  Service: Endoscopy;  Laterality: N/A;  . Ankle fracture surgery Right 1989    "plate put in"  . Laparoscopic umbilical hernia repair w/ mesh  06-06-2011  . Open appendectomy w/ partial cecectomy  05-16-2004  .  Dobutamine stress echo  06-08-2012    MODERATE HYPOKINESIS/ ISCHEMIA MID INFERIOR WALL  . Nephrolithotomy  1990'S  . Circumcision N/A 09/09/2013    Procedure: CIRCUMCISION ADULT;  Surgeon: Bernestine Amass, MD;  Location: [facility];  Service: Urology;  Laterality: N/A;  . Esophagogastroduodenoscopy N/A 02/17/2014    Procedure: ESOPHAGOGASTRODUODENOSCOPY (EGD);  Surgeon: Jeryl Columbia, MD;  Location: [facility] ENDOSCOPY;  Service: Endoscopy;  Laterality: N/A;  . Hernia repair    . Appendectomy  05-16-2004    open  . Colectomy  05-16-2004  . Coronary angioplasty with stent placement  06/28/2012  DR COOPER    PCI W/  X1 DES to [place]. LAD/  LM  40% OSTIAL & 50-60% DISTAL /  50% PROX LCX/  30-40% PROX RCA & 50% MID RCA/   LVEF 65-70%  . Coronary artery bypass graft N/A 03/31/2014    Procedure: CORONARY ARTERY BYPASS GRAFTING (CABG) times 3 using left internal mammary artery and right saphenous vein.;  Surgeon: Melrose Nakayama, MD;  Location: [facility];  Service: Open Heart Surgery;  Laterality: N/A;  . Intraoperative transesophageal echocardiogram N/A 03/31/2014    Procedure: INTRAOPERATIVE TRANSESOPHAGEAL ECHOCARDIOGRAM;  Surgeon: Melrose Nakayama, MD;  Location: [facility];  Service: Open Heart Surgery;  Laterality: N/A;    Family History  Problem Relation Age of Onset  . Coronary artery disease    . Diabetes    . Colon cancer    . Lung cancer Sister   . Cancer Sister     lung  . Cancer Brother     lung  . Cancer Sister     lung  . Cancer Father     died in his 58s.  . Cancer Mother     History   Social History  . Marital Status: Married    Spouse Name: N/A    Number of Children: N/A  . Years of Education: N/A   Occupational History  . Not on file.   Social History Main Topics  . Smoking status: Former Smoker -- 2.00 packs/day for 40 years    Types: Cigarettes    Quit date: 08/29/1996  . Smokeless tobacco: Never Used  . Alcohol Use: Yes     Comment: 03/27/2014  "stopped frinking ~ 2 months ago; drank heavily before that"  . Drug Use: No  . Sexual Activity: Yes   Other Topics Concern  . Not on file   Social History  Narrative   Lives in [place] with wife, son, and son's family.    ROS: See history of present illness otherwise negative  Ht 5\' 8"  (1.727 m)  PHYSICAL EXAM: Obese, in no acute distress. Neck: No JVD, HJR, Bruit, or thyroid enlargement  Lungs: Decreased breath sounds but No tachypnea, clear without wheezing, rales, or rhonchi  Cardiovascular: RRR, PMI not displaced, heart sounds normal, no murmurs, gallops, bruit, thrill, or heave.  Abdomen: BS normal. Soft without organomegaly, masses, lesions or tenderness.  Extremities: +2-3 bilateral lower extremity edema with brawny changes. Decreased distal pulses bilateral  SKin: Warm, no lesions or rashes   Musculoskeletal: No deformities  Neuro: no focal signs   Wt Readings from Last 3 Encounters:  05/06/14 250 lb (113.399 kg)  04/18/14 250 lb (113.399 kg)  04/05/14 241 lb 10 oz (109.6 kg)     EKG: Normal sinus rhythm nonspecific ST-T wave changes, no acute change  Significant Diagnostic Studies: angiography:   HEMODYNAMICS: Aortic pressure 119/59 mmHg; LV pressure 126/4 mmHg; LVEDP 21 mm mercury   ANGIOGRAPHIC DATA: The left main coronary artery is heavily calcified. There is 30% proximal stenosis. An 70-80% distal left main stenosis.   The left anterior descending artery is patent, contains a proximal stent, and an eccentric ostial 60-70% narrowing. The distal vessel is large.   The left circumflex artery is calcified with ostial 90%, hazy stenosis. The mid vessel contains segmental 80-90% narrowing before ending, trifurcating obtuse marginal.   The right coronary artery is heavy proximal to mid calcification. There is proximal and mid 50% narrowing. Large PDA and left ventricular branch arises distally.Marland Kitchen   LEFT VENTRICULOGRAM: Left ventricular angiogram was done in the 30  RAO projection and revealed a cavity that is upper limit of normal in size. Contractility is normal with an EF of 60%.

## 2014-05-14 NOTE — Assessment & Plan Note (Signed)
Patient has a significant amount of edema and weight gain. Increase Lasix to 40 mg twice a day. 2 g sodium diet.

## 2014-05-14 NOTE — Assessment & Plan Note (Signed)
Patient underwent CABG x3 with a LIMA to the LAD, SVG to the OM, SVG to the PDA on 03/31/14. EF 60% by cath. Patient has a significant amount of edema and 9 pound weight gain. He was told to increase his Lasix by Dr. Roxan Hockey last week but was confused over the dosage. Increase Lasix to 40 mg twice a day. Potassium has already been added. He has followup with Dr. Roxan Hockey next week. Followup with Dr.Nahser in 6 weeks. 2 g sodium diet. Recommend starting cardiac rehabilitation.

## 2014-05-14 NOTE — Patient Instructions (Addendum)
Your physician has recommended you make the following change in your medication:   1. CONTINUE TO TAKE 20 MG LASIX TWICE A DAY UNTIL COMPLETED   2, START TAKING 50 MG LASIX  TWICE  A DAY    Your physician recommends that you schedule a follow-up appointment in: DR NAUSER IN 6 WEEKS     Low-Sodium Eating Plan Sodium raises blood pressure and causes water to be held in the body. Getting less sodium from food will help lower your blood pressure, reduce any swelling, and protect your heart, liver, and kidneys. We get sodium by adding salt (sodium chloride) to food. Most of our sodium comes from canned, boxed, and frozen foods. Restaurant foods, fast foods, and pizza are also very high in sodium. Even if you take medicine to lower your blood pressure or to reduce fluid in your body, getting less sodium from your food is important. WHAT IS MY PLAN? Most people should limit their sodium intake to 2,300  mg a day. Your health care provider recommends that you limit your sodium intake to __2000MG ________ a day.  WHAT DO I NEED TO KNOW ABOUT THIS EATING PLAN? For the low-sodium eating plan, you will follow these general guidelines:  Choose foods with a % Daily Value for sodium of less than 5% (as listed on the food label).   Use salt-free seasonings or herbs instead of table salt or sea salt.   Check with your health care provider or pharmacist before using salt substitutes.   Eat fresh foods.  Eat more vegetables and fruits.  Limit canned vegetables. If you do use them, rinse them well to decrease the sodium.   Limit cheese to 1 oz (28 g) per day.   Eat lower-sodium products, often labeled as "lower sodium" or "no salt added."  Avoid foods that contain monosodium glutamate (MSG). MSG is sometimes added to [place] food and some canned foods.  Check food labels (Nutrition Facts labels) on foods to learn how much sodium is in one serving.  Eat more home-cooked food and less  restaurant, buffet, and fast food.  When eating at a restaurant, ask that your food be prepared with less salt or none, if possible.  HOW DO I READ FOOD LABELS FOR SODIUM INFORMATION? The Nutrition Facts label lists the amount of sodium in one serving of the food. If you eat more than one serving, you must multiply the listed amount of sodium by the number of servings. Food labels may also identify foods as:  Sodium free--Less than 5 mg in a serving.  Very low sodium--35 mg or less in a serving.  Low sodium--140 mg or less in a serving.  Light in sodium--50% less sodium in a serving. For example, if a food that usually has 300 mg of sodium is changed to become light in sodium, it will have 150 mg of sodium.  Reduced sodium--25% less sodium in a serving. For example, if a food that usually has 400 mg of sodium is changed to reduced sodium, it will have 300 mg of sodium. WHAT FOODS CAN I EAT? Grains Low-sodium cereals, including oats, puffed wheat and rice, and shredded wheat cereals. Low-sodium crackers. Unsalted rice and pasta. Lower-sodium bread.  Vegetables Frozen or fresh vegetables. Low-sodium or reduced-sodium canned vegetables. Low-sodium or reduced-sodium tomato sauce and paste. Low-sodium or reduced-sodium tomato and vegetable juices.  Fruits Fresh, frozen, and canned fruit. Fruit juice.  Meat and Other Protein Products Low-sodium canned tuna and salmon. Fresh or frozen  meat, poultry, seafood, and fish. Lamb. Unsalted nuts. Dried beans, peas, and lentils without added salt. Unsalted canned beans. Homemade soups without salt. Eggs.  Dairy Milk. Soy milk. Ricotta cheese. Low-sodium or reduced-sodium cheeses. Yogurt.  Condiments Fresh and dried herbs and spices. Salt-free seasonings. Onion and garlic powders. Low-sodium varieties of mustard and ketchup. Lemon juice.  Fats and Oils Reduced-sodium salad dressings. Unsalted butter.  Other Unsalted popcorn and pretzels.   The items listed above may not be a complete list of recommended foods or beverages. Contact your dietitian for more options. WHAT FOODS ARE NOT RECOMMENDED? Grains Instant hot cereals. Bread stuffing, pancake, and biscuit mixes. Croutons. Seasoned rice or pasta mixes. Noodle soup cups. Boxed or frozen macaroni and cheese. Self-rising flour. Regular salted crackers. Vegetables Regular canned vegetables. Regular canned tomato sauce and paste. Regular tomato and vegetable juices. Frozen vegetables in sauces. Salted french fries. Olives. Angie Fava. Relishes. Sauerkraut. Salsa. Meat and Other Protein Products Salted, canned, smoked, spiced, or pickled meats, seafood, or fish. Bacon, ham, sausage, hot dogs, corned beef, chipped beef, and packaged luncheon meats. Salt pork. Jerky. Pickled herring. Anchovies, regular canned tuna, and sardines. Salted nuts. Dairy Processed cheese and cheese spreads. Cheese curds. Blue cheese and cottage cheese. Buttermilk.  Condiments Onion and garlic salt, seasoned salt, table salt, and sea salt. Canned and packaged gravies. Worcestershire sauce. Tartar sauce. Barbecue sauce. Teriyaki sauce. Soy sauce, including reduced sodium. Steak sauce. Fish sauce. Oyster sauce. Cocktail sauce. Horseradish. Regular ketchup and mustard. Meat flavorings and tenderizers. Bouillon cubes. Hot sauce. Tabasco sauce. Marinades. Taco seasonings. Relishes. Fats and Oils Regular salad dressings. Salted butter. Margarine. Ghee. Bacon fat.  Other Potato and tortilla chips. Corn chips and puffs. Salted popcorn and pretzels. Canned or dried soups. Pizza. Frozen entrees and pot pies.  The items listed above may not be a complete list of foods and beverages to avoid. Contact your dietitian for more information. Document Released: 02/04/2002 Document Revised: 08/20/2013 Document Reviewed: 06/19/2013 [facility] Patient Information 2015 [place], [place]. This information is not intended to replace  advice given to you by your health care provider. Make sure you discuss any questions you have with your health care provider.

## 2014-05-14 NOTE — Assessment & Plan Note (Signed)
BP well controlled.

## 2014-05-15 ENCOUNTER — Telehealth (HOSPITAL_COMMUNITY): Payer: Self-pay | Admitting: *Deleted

## 2014-05-15 NOTE — Telephone Encounter (Signed)
Received signed order from MD.  Talked to pt's son.  Pt declines participating in cardiac rehab at this time.  Pt cited that he has too many things going on to attend.  Reminded pt that Medicare will reimburse for cardiac rehab up to 12 months after cardiac event.  Pt son verbalized understanding.

## 2014-05-20 ENCOUNTER — Ambulatory Visit: Payer: Self-pay | Admitting: Thoracic Surgery (Cardiothoracic Vascular Surgery)

## 2014-05-20 ENCOUNTER — Encounter: Payer: Self-pay | Admitting: Thoracic Surgery (Cardiothoracic Vascular Surgery)

## 2014-05-20 VITALS — Ht 68.0 in | Wt 248.0 lb

## 2014-05-26 ENCOUNTER — Other Ambulatory Visit: Payer: Self-pay | Admitting: Thoracic Surgery (Cardiothoracic Vascular Surgery)

## 2014-05-26 ENCOUNTER — Ambulatory Visit
Admission: RE | Admit: 2014-05-26 | Discharge: 2014-05-26 | Disposition: A | Discharge status: Home / Self Care | Payer: Medicare Other | Source: Ambulatory Visit | Attending: Thoracic Surgery (Cardiothoracic Vascular Surgery) | Admitting: Thoracic Surgery (Cardiothoracic Vascular Surgery)

## 2014-05-26 ENCOUNTER — Ambulatory Visit (INDEPENDENT_AMBULATORY_CARE_PROVIDER_SITE_OTHER): Payer: Self-pay | Admitting: Physician Assistant

## 2014-05-26 ENCOUNTER — Other Ambulatory Visit: Payer: Self-pay | Admitting: *Deleted

## 2014-05-26 VITALS — BP 118/62 | HR 62 | Resp 20 | Ht 68.0 in | Wt 243.0 lb

## 2014-05-26 DIAGNOSIS — Z951 Presence of aortocoronary bypass graft: Secondary | ICD-10-CM

## 2014-05-26 DIAGNOSIS — R609 Edema, unspecified: Secondary | ICD-10-CM

## 2014-05-26 DIAGNOSIS — R600 Localized edema: Secondary | ICD-10-CM

## 2014-05-26 DIAGNOSIS — I2510 Atherosclerotic heart disease of native coronary artery without angina pectoris: Secondary | ICD-10-CM

## 2014-05-26 MED ORDER — METOLAZONE 5 MG PO TABS: 5.0000 mg | ORAL_TABLET | Freq: Every day | ORAL | Status: DC

## 2014-05-26 NOTE — Progress Notes (Signed)
HPI: Patient returns for routine postoperative follow-up having undergone CABG x 3 on 03/31/2014.  He was evaluated by Dr. Roxan Hockey on 05/06/2014 for routine post operative follow up.  During that visit the patient was noted to have significant lower extremity edema and was instructed to increase his Lasix dose and to apply TED stockings.  Since that visit he was seen by Cardiology who states patient was confused about Lasix regimen and this was adjusted by Cardiology.  The patient is not participating in cardiac rehab stating he had too many other things going on.  He presents today for 2 week follow up for his LE edema.  The patient states he is doing okay.  He states once his Lasix regimen was clarified by Cardiology he has been taking 40 mg by mouth twice a day.  He states he feels his edema has improved.  He states he props his legs up when he is resting.  He is not wearing compression stockings stating he just did not purchase any.  He continues to refuse to participate in cardiac rehab, stating he is active enough on his own.  Finally in regards to a low salt diet, the patient states he sticks to this for the most part.  Current Outpatient Prescriptions  Medication Sig Dispense Refill  . aspirin 81 MG chewable tablet Chew 1 tablet (81 mg total) by mouth daily.      Marland Kitchen atorvastatin (LIPITOR) 80 MG tablet Take 80 mg by mouth daily.      . ferrous sulfate 325 (65 FE) MG tablet Take 325 mg by mouth 2 (two) times daily with a meal.       . furosemide (LASIX) 40 MG tablet Take 1 tablet (40 mg total) by mouth daily. TAKE 1 TABLET 40 MG TWICE A DAY  60 tablet  10  . losartan (COZAAR) 25 MG tablet Take 1 tablet (25 mg total) by mouth daily.  90 tablet  0  . metFORMIN (GLUCOPHAGE) 500 MG tablet Take 500 mg by mouth 2 (two) times daily with a meal.      . metoprolol tartrate (LOPRESSOR) 12.5 mg TABS tablet Take 0.5 tablets (12.5 mg total) by mouth 2 (two) times daily.  60 tablet  3  . oxyCODONE (OXY  IR/ROXICODONE) 5 MG immediate release tablet Take 1-2 tablets (5-10 mg total) by mouth every 4 (four) hours as needed for severe pain.  30 tablet  0  . Potassium Chloride ER 20 MEQ TBCR Take 20 mEq by mouth daily. For 5 days then stop.  30 tablet  3  . ranitidine (ZANTAC) 150 MG capsule Take 150 mg by mouth 2 (two) times daily.       No current facility-administered medications for this visit.    Physical Exam:  Gen: no apparent distress Lung: CTA bilaterally Heart: RRR Skin: incisions well healed Ext: 2+ pitting edema Bilateral lower extremity  Diagnostic Tests:  CXR: minimal posterior pleural effusion  A/P:  1. Lower extremity edema- patient now taking Lasix 40 mg BID after clarification from Cardiology.  He continues to have lower extremity edema, will add short course of Zaroxolyn to Lasix regimen 2. Apply compression stockings- patient was given a prescription for these, he states he just did not purchase them from his last visit.  He was instructed on the benefit of wearing these stockings daily 3. Cardiac rehab- patient continues to refuse, states he is active enough on his own.  I again explained the benefit of the program to  the patient 4. RTC in 2 weeks for repeat evaluation on LE edema.  Patient again instructed on monitoring weight and should he notice an increase in his weight of 5-10 pounds he would need to contact his Cardiologist or our office to be seen sooner.

## 2014-05-27 ENCOUNTER — Institutional Professional Consult (permissible substitution): Payer: Medicare Other | Admitting: Pulmonary Disease

## 2014-05-27 ENCOUNTER — Encounter: Payer: Self-pay | Admitting: Pulmonary Disease

## 2014-05-27 ENCOUNTER — Ambulatory Visit (INDEPENDENT_AMBULATORY_CARE_PROVIDER_SITE_OTHER): Payer: Medicare Other | Admitting: Pulmonary Disease

## 2014-05-27 VITALS — BP 114/76 | HR 60 | Temp 99.2°F | Ht 68.0 in | Wt 240.4 lb

## 2014-05-27 DIAGNOSIS — I200 Unstable angina: Secondary | ICD-10-CM

## 2014-05-27 DIAGNOSIS — G4733 Obstructive sleep apnea (adult) (pediatric): Secondary | ICD-10-CM

## 2014-05-27 NOTE — Progress Notes (Signed)
   Subjective:    Patient ID: [name], male    DOB: 08-Sep-1945, 68 y.o.   MRN: [id number]  HPI The patient comes in today for reevaluation of his obstructive sleep apnea. He has known moderate OSA, and quit using CPAP a year ago because of complete and tolerance. He now has been having issues with coronary artery disease and congestive heart failure, and cardiology has suggested that he treat his sleep apnea more aggressively.   Review of Systems  Constitutional: Negative for fever and unexpected weight change.  HENT: Negative for congestion, dental problem, ear pain, nosebleeds, postnasal drip, rhinorrhea, sinus pressure, sneezing, sore throat and trouble swallowing.   Eyes: Negative for redness and itching.  Respiratory: Negative for cough, chest tightness, shortness of breath and wheezing.   Cardiovascular: Negative for palpitations and leg swelling.  Gastrointestinal: Negative for nausea and vomiting.  Genitourinary: Negative for dysuria.  Musculoskeletal: Negative for joint swelling.  Skin: Negative for rash.  Neurological: Negative for headaches.  Hematological: Does not bruise/bleed easily.  Psychiatric/Behavioral: Negative for dysphoric mood. The patient is not nervous/anxious.        Objective:   Physical Exam Obese male in no acute distress Nose without purulence or discharge noted Neck without lymphadenopathy or thyromegaly Lower extremities with edema noted, no cyanosis Alert and oriented, moves all 4 extremities.       Assessment & Plan:

## 2014-05-27 NOTE — Assessment & Plan Note (Signed)
The patient has a history of moderate obstructive sleep apnea, and has been completely intolerant to CPAP in the past. He is now having issues with congestive heart failure, and cardiology has suggested that he be re\re evaluated. I am willing to give him a trial of bilevel to see if he can tolerate, but the patient has to be willing to give it his 100% effort.  He is willing to try bilevel, but I'm not sure that his heart is really in it.  I have also stressed to him the importance of working aggressively on weight loss.

## 2014-05-27 NOTE — Patient Instructions (Signed)
Will try on bilevel to see if you tolerate this better.  Please call if you are having tolerance issues. Work on weight loss followup with me again in 8 weeks.

## 2014-05-28 ENCOUNTER — Telehealth: Payer: Self-pay | Admitting: Pulmonary Disease

## 2014-05-28 NOTE — Telephone Encounter (Signed)
Per 05/27/14 OV w/ KC; Patient Instructions      Will try on bilevel to see if you tolerate this better.  Please call if you are having tolerance issues. Work on weight loss followup with me again in 8 weeks   Called spoke with [facility]. She reports since it is documented pt has not used CPAP x 1 year or more. Pt has medicare and they are requiring pt have another sleep study Also per Rejeana Brock also placed order dating back 12/10/12 to d/c CPAP.  Please advise [facility] thanks

## 2014-05-28 NOTE — Telephone Encounter (Signed)
The pt was completely intolerant of cpap, and that is why it was d/ced.  And that is why he has not used machine.  All of this is in my note from yesterday.  Let pt know that medicare will not cover bipap unless he has another sleep study.  See if he is willing to do this.

## 2014-05-29 ENCOUNTER — Institutional Professional Consult (permissible substitution): Payer: Medicare Other | Admitting: Pulmonary Disease

## 2014-05-29 NOTE — Telephone Encounter (Signed)
LMTC x `1 for pt 

## 2014-06-03 NOTE — Telephone Encounter (Signed)
lmtcb

## 2014-06-04 NOTE — Telephone Encounter (Signed)
lmtcb for [name].  

## 2014-06-10 ENCOUNTER — Ambulatory Visit (INDEPENDENT_AMBULATORY_CARE_PROVIDER_SITE_OTHER): Payer: Self-pay | Admitting: Thoracic Surgery (Cardiothoracic Vascular Surgery)

## 2014-06-10 ENCOUNTER — Encounter: Payer: Self-pay | Admitting: Thoracic Surgery (Cardiothoracic Vascular Surgery)

## 2014-06-10 ENCOUNTER — Other Ambulatory Visit: Payer: Self-pay | Admitting: Pulmonary Disease

## 2014-06-10 ENCOUNTER — Ambulatory Visit: Payer: Self-pay | Admitting: Thoracic Surgery (Cardiothoracic Vascular Surgery)

## 2014-06-10 VITALS — BP 137/69 | HR 73 | Ht 68.0 in | Wt 240.0 lb

## 2014-06-10 DIAGNOSIS — G4733 Obstructive sleep apnea (adult) (pediatric): Secondary | ICD-10-CM

## 2014-06-10 DIAGNOSIS — Z951 Presence of aortocoronary bypass graft: Secondary | ICD-10-CM

## 2014-06-10 NOTE — Telephone Encounter (Signed)
Spoke with pt.  Explained below to him.  He would like to proceed with repeat sleep study.   Dr. Gwenette Greet, do you want the NPSG study? Pt aware [facility] will be contacting him once study is scheduled.

## 2014-06-10 NOTE — Telephone Encounter (Signed)
Order sent to [facility] 

## 2014-06-10 NOTE — Progress Notes (Signed)
HPI:  [name] is a 69 year old gentleman who according bypass grafting x3 on 03/31/2014. He was last in the office by me on September 8. At that time he had a lot of swelling in his feet and ankles and increase his Lasix to twice daily. He followed up in the office on September 28 with Erin Barrett. She gave him 3 days of Zaroxolyn. His swelling went down dramatically after taking Zaroxolyn. It did increase when she was off of that but is not as bad as it was before.  He says that he is feeling better. His exercise tolerance is improving. His breathing is improved. He still does have some swelling which might be mildly worse than it was prior to surgery.  Past Medical History  Diagnosis Date  . Unspecified essential hypertension   . Coronary artery disease     a. 05/2012 Cath/PCI: LM 40ost, 50-60d, LAD 99p ruptured plaque (3.0x28 DES), LCX 50p/m, RCA 30-40p, 59m, EF 65-70%.  . Type 2 diabetes mellitus   . GERD (gastroesophageal reflux disease)   . Phimosis     a. s/p circumcision 2015.  Marland Kitchen Hyperlipidemia   . History of concussion     1976--  NO RESIDUAL  . Iron deficiency anemia   . History of GI bleed     a. UGIB 07/2012;  b. 01/2014 admission with GIB/FOB stool req 1U prbc's->EGD showed portal gastropathy, barrett's esoph, and chronic active h. pylori gastritis.  Marland Kitchen History of gout     2007 &  2008  LEFT LEG-- NO ISSUE SINCE  . Chronic low back pain   . OSA (obstructive sleep apnea)     PULMOLOGIST-  DR CLANCE--  MODERATE OSA  STARTED CPAP 2012--  BUT CURRENTLY HAS NOT USED PAST 6 MONTHS  . Hepatic cirrhosis     a. Dx 01/2014 - CT a/p   . Kidney stones   . Asthma   . History of blood transfusion     "related to bleeding ulcers"  . Arthritis     "joints tighten up sometimes" (03/27/2104)  . OA (osteoarthritis of spine)     LOWER BACK--  INTERMITTANT LEFT LEG NUMBNESS  . Chronic lower back pain       Current Outpatient Prescriptions  Medication Sig Dispense Refill  . aspirin  81 MG chewable tablet Chew 81 mg by mouth 2 (two) times daily.      Marland Kitchen atorvastatin (LIPITOR) 80 MG tablet Take 80 mg by mouth daily.      . ferrous sulfate 325 (65 FE) MG tablet Take 325 mg by mouth 2 (two) times daily with a meal.       . furosemide (LASIX) 40 MG tablet Take 1 tablet (40 mg total) by mouth daily. TAKE 1 TABLET 40 MG TWICE A DAY  60 tablet  10  . losartan (COZAAR) 25 MG tablet Take 1 tablet (25 mg total) by mouth daily.  90 tablet  0  . metFORMIN (GLUCOPHAGE) 500 MG tablet Take 500 mg by mouth 2 (two) times daily with a meal.      . metoprolol tartrate (LOPRESSOR) 12.5 mg TABS tablet Take 0.5 tablets (12.5 mg total) by mouth 2 (two) times daily.  60 tablet  3  . ranitidine (ZANTAC) 150 MG capsule Take 150 mg by mouth 2 (two) times daily.       No current facility-administered medications for this visit.    Physical Exam BP 137/69  Pulse 73  Ht 5\' 8"  (1.727 m)  Wt 240 lb (108.863 kg)  BMI 36.50 kg/m2  SpO9 48% 68 year old man in no acute distress Sternal incision well-healed, sternum stable Cardiac regular rate and rhythm no rubs Lungs clear with breath sounds bilaterally Leg incisions well healed 2-3+ edema both lower extremities, chronic venous stasis changes  Diagnostic Tests: CHEST 2 VIEW  COMPARISON: 05/06/2014  FINDINGS:  Minimal enlargement of cardiac silhouette post CABG.  Atherosclerotic calcification aorta.  Mediastinal contours and pulmonary vascularity normal.  Minimal chronic peribronchial thickening.  No pulmonary infiltrate, pleural effusion or pneumothorax.  Bones unremarkable.  IMPRESSION:  Minimal enlargement of cardiac silhouette post CABG and chronic  bronchitic changes.  Electronically Signed  By: Lavonia Dana M.D.  On: 05/26/2014 14:13   Impression: [name] is now about 2 months post coronary bypass grafting x3. He has had problems with volume overloaded with peripheral edema. He did have peripheral edema prior to surgery.  He  currently is feeling well. He still has peripheral edema. It has improved from where it was initially postoperatively. He still hasn't gotten his compression stockings which I do think will help significantly. For now I would keep him on Lasix 40 mg by mouth twice a day. I do not think we need to restart the Zaroxolyn.   Plan:  He will followup with Dr. Acie Fredrickson as scheduled  I will be happy to see him back any time if I can be of any further assistance with his care.

## 2014-06-11 NOTE — Telephone Encounter (Signed)
Spoke with pt's son [name] & gave him appt info for Split Night Study.  I also advised the son that I asked for pt to be placed on cancellation list for sooner appt.  Son is aware I am mailing a Sleep Packet to his father [name]

## 2014-06-19 ENCOUNTER — Other Ambulatory Visit: Payer: Self-pay | Admitting: Physician Assistant

## 2014-06-25 ENCOUNTER — Encounter: Payer: Self-pay | Admitting: Cardiovascular Disease

## 2014-06-25 ENCOUNTER — Ambulatory Visit (INDEPENDENT_AMBULATORY_CARE_PROVIDER_SITE_OTHER): Payer: Medicare Other | Admitting: Cardiovascular Disease

## 2014-06-25 VITALS — BP 130/60 | HR 94 | Ht 68.0 in | Wt 244.8 lb

## 2014-06-25 DIAGNOSIS — I209 Angina pectoris, unspecified: Secondary | ICD-10-CM

## 2014-06-25 DIAGNOSIS — I110 Hypertensive heart disease with heart failure: Secondary | ICD-10-CM

## 2014-06-25 DIAGNOSIS — I200 Unstable angina: Secondary | ICD-10-CM

## 2014-06-25 DIAGNOSIS — I25119 Atherosclerotic heart disease of native coronary artery with unspecified angina pectoris: Secondary | ICD-10-CM

## 2014-06-25 DIAGNOSIS — I25810 Atherosclerosis of coronary artery bypass graft(s) without angina pectoris: Secondary | ICD-10-CM

## 2014-06-25 DIAGNOSIS — I509 Heart failure, unspecified: Secondary | ICD-10-CM

## 2014-06-25 NOTE — Patient Instructions (Addendum)
Your physician recommends that you continue on your current medications as directed. Please refer to the Current Medication list given to you today.  Your physician recommends that you return for lab work:  TOMORROW  - cholesterol, liver and basic metabolic panels You will need to FAST for this appointment - nothing to eat or drink after midnight the night before except water.  Your physician recommends that you schedule a follow-up appointment in: 3 months with Dr. Acie Fredrickson  REDUCE HIGH SODIUM FOODS LIKE CANNED SOUP, GRAVY, SAUCES, READY PREPARED FOODS [place]; LEAN CUISINE, LASAGNA. BACON, SAUSAGE, LUNCH MEAT, FAST FOODS, HOT DOGS, CHIPS, PIZZA.

## 2014-06-25 NOTE — Assessment & Plan Note (Signed)
He is  status post coronary artery bypass grafting. He's not having any episodes of angina. He does have some sternal tenderness.  Is clear that he still eating too much salt.  He's been eating lots of [place]  Bacon and [place] hot dogs. I discharged these substitutions.

## 2014-06-25 NOTE — Progress Notes (Signed)
[name] Date of Birth  [birth year]       [facility]    [employer] [address], [place], [place]  [address]   [place], [place]  [phone]     [phone]   Fax  [phone]    Fax [phone]  Problem List: 1. CAD -  3.0 x 28 mm drug-eluting stent (06-28-12) 2. Indigestion 3. Anemia   History of Present Illness:  [name] is a 68 yo with hx of indigestion / chest pain  that develops with walking.  The pain is a "ache" and lasts as long as he is walking.   It causes him some dyspnea.  It typically goes away when he stops.  He has lots of belching.  Hx of asthma. Hx of smoking in the past.  No regular exercise.  He is now retired.  He gets a little exercise every day.  He had a dobutamine echo that revealed mild ischemia in the inferior wall.    He had a cardiac cath which revealed a tight irregular LAD stenosis. He had PTCA and stenting of his LAD by Dr. Burt Knack.  We have advised him to stop  eating fried foods. He still is eating fried potatoes on a regular basis.   Jan 14, 2013:  [name] has done well from a cardiac standpoint. He is having some problems with anemia. He was scheduled have a colonoscopy tomorrow but I did not want to stop the Effient  and aspirin. The colonoscopy was canceled.  Nov. 17, 2014:  [name] is doing well.  He is now a year out from his stenting and can stop the Effient for his colonoscopy.  He does some exercise.   He has completely ignored my dietary suggestions from previous visits.  Still eats everything that he wants to.  Having some abdominal pains.    Oct. 28, 2015:  [name] is doing OK. He's had a  hospital admission on July 31 and was found have severe left main disease as well as severe coronary disease. He had coronary artery bypass grafting on August 3.    (CABG)X3 LIMA-LAD; SVG-OM; SVG-PD  Still has some chest soreness.    Current Outpatient Prescriptions on File  Prior to Visit  Medication Sig Dispense Refill  . aspirin 81 MG chewable tablet Chew 81 mg by mouth 2 (two) times daily.      Marland Kitchen atorvastatin (LIPITOR) 80 MG tablet Take 80 mg by mouth daily.      . ferrous sulfate 325 (65 FE) MG tablet Take 325 mg by mouth 2 (two) times daily with a meal.       . furosemide (LASIX) 40 MG tablet Take 1 tablet (40 mg total) by mouth daily. TAKE 1 TABLET 40 MG TWICE A DAY  60 tablet  10  . losartan (COZAAR) 25 MG tablet Take 1 tablet (25 mg total) by mouth daily.  90 tablet  0  . metFORMIN (GLUCOPHAGE) 500 MG tablet Take 500 mg by mouth 2 (two) times daily with a meal.      . metoprolol tartrate (LOPRESSOR) 12.5 mg TABS tablet Take 0.5 tablets (12.5 mg total) by mouth 2 (two) times daily.  60 tablet  3  . ranitidine (ZANTAC) 150 MG capsule Take 150 mg by mouth 2 (two) times daily.       No current facility-administered medications on file prior to visit.    No Known Allergies  Past Medical History  Diagnosis Date  . Unspecified essential hypertension   . Coronary artery disease     a. 05/2012 Cath/PCI: LM 40ost, 50-60d, LAD 99p ruptured plaque (3.0x28 DES), LCX 50p/m, RCA 30-40p, 75m, EF 65-70%.  . Type 2 diabetes mellitus   . GERD (gastroesophageal reflux disease)   . Phimosis     a. s/p circumcision 2015.  Marland Kitchen Hyperlipidemia   . History of concussion     1976--  NO RESIDUAL  . Iron deficiency anemia   . History of GI bleed     a. UGIB 07/2012;  b. 01/2014 admission with GIB/FOB stool req 1U prbc's->EGD showed portal gastropathy, barrett's esoph, and chronic active h. pylori gastritis.  Marland Kitchen History of gout     2007 &  2008  LEFT LEG-- NO ISSUE SINCE  . Chronic low back pain   . OSA (obstructive sleep apnea)     PULMOLOGIST-  DR CLANCE--  MODERATE OSA  STARTED CPAP 2012--  BUT CURRENTLY HAS NOT USED PAST 6 MONTHS  . Hepatic cirrhosis     a. Dx 01/2014 - CT a/p   . Kidney stones   . Asthma   . History of blood transfusion     "related to bleeding  ulcers"  . Arthritis     "joints tighten up sometimes" (03/27/2104)  . OA (osteoarthritis of spine)     LOWER BACK--  INTERMITTANT LEFT LEG NUMBNESS  . Chronic lower back pain     Past Surgical History  Procedure Laterality Date  . Esophagogastroduodenoscopy  08/15/2012    Procedure: ESOPHAGOGASTRODUODENOSCOPY (EGD);  Surgeon: Wonda Horner, MD;  Location: [facility] ENDOSCOPY;  Service: Endoscopy;  Laterality: N/A;  . Ankle fracture surgery Right 1989    "plate put in"  . Laparoscopic umbilical hernia repair w/ mesh  06-06-2011  . Open appendectomy w/ partial cecectomy  05-16-2004  . Dobutamine stress echo  06-08-2012    MODERATE HYPOKINESIS/ ISCHEMIA MID INFERIOR WALL  . Nephrolithotomy  1990'S  . Circumcision N/A 09/09/2013    Procedure: CIRCUMCISION ADULT;  Surgeon: Bernestine Amass, MD;  Location: [facility];  Service: Urology;  Laterality: N/A;  . Esophagogastroduodenoscopy N/A 02/17/2014    Procedure: ESOPHAGOGASTRODUODENOSCOPY (EGD);  Surgeon: Jeryl Columbia, MD;  Location: [facility] ENDOSCOPY;  Service: Endoscopy;  Laterality: N/A;  . Hernia repair    . Appendectomy  05-16-2004    open  . Colectomy  05-16-2004  . Coronary angioplasty with stent placement  06/28/2012  DR COOPER    PCI W/  X1 DES to [place]. LAD/  LM  40% OSTIAL & 50-60% DISTAL /  50% PROX LCX/  30-40% PROX RCA & 50% MID RCA/   LVEF 65-70%  . Coronary artery bypass graft N/A 03/31/2014    Procedure: CORONARY ARTERY BYPASS GRAFTING (CABG) times 3 using left internal mammary artery and right saphenous vein.;  Surgeon: Melrose Nakayama, MD;  Location: [facility];  Service: Open Heart Surgery;  Laterality: N/A;  . Intraoperative transesophageal echocardiogram N/A 03/31/2014    Procedure: INTRAOPERATIVE TRANSESOPHAGEAL ECHOCARDIOGRAM;  Surgeon: Melrose Nakayama, MD;  Location: [facility];  Service: Open Heart Surgery;  Laterality: N/A;    History  Smoking status  . Former Smoker -- 2.00 packs/day for 40 years  . Types:  Cigarettes  . Quit date: 08/29/1996  Smokeless tobacco  . Never Used    History  Alcohol Use  . Yes    Comment: 03/27/2014 "stopped frinking ~ 2 months ago;  drank heavily before that"    Family History  Problem Relation Age of Onset  . Coronary artery disease    . Diabetes    . Colon cancer    . Lung cancer Sister   . Cancer Sister     lung  . Cancer Brother     lung  . Cancer Sister     lung  . Cancer Father     died in his 29s.  . Cancer Mother     Reviw of Systems:  Reviewed in the HPI.  All other systems are negative.  Physical Exam: Blood pressure 130/60, pulse 94, height 5\' 8"  (1.727 m), weight 244 lb 12.8 oz (111.041 kg). General: Well developed, well nourished, in no acute distress.  Head: Normocephalic, atraumatic, sclera non-icteric, mucus membranes are moist,   Neck: Supple. Carotids are 2 + without bruits. No JVD  Lungs: Clear bilaterally to auscultation.  Few rhonchi with cough  Heart: regular rate.  normal  S1 S2. No murmurs, gallops or rubs.  Abdomen: Soft, non-tender, obese.  Exogenous obesity - difficult to access femoral arteries.  Msk:  Strength and tone are normal  Extremities: No clubbing or cyanosis. No   edema.  Distal pedal pulses are 2+ and equal bilaterally.  Neuro: Alert and oriented X 3. Moves all extremities spontaneously.  Psych:  Responds to questions appropriately with a normal affect.  ECG:  Assessment / Plan:

## 2014-06-25 NOTE — Assessment & Plan Note (Signed)
He continues to eat  lots of salt. I've encouraged him to eat  a low-salt diet. Continue the same medication. Check labs tomorrow.

## 2014-06-26 ENCOUNTER — Other Ambulatory Visit (INDEPENDENT_AMBULATORY_CARE_PROVIDER_SITE_OTHER): Payer: Medicare Other | Admitting: *Deleted

## 2014-06-26 DIAGNOSIS — I25119 Atherosclerotic heart disease of native coronary artery with unspecified angina pectoris: Secondary | ICD-10-CM

## 2014-06-26 LAB — BASIC METABOLIC PANEL
BUN: 17 mg/dL (ref 6–23)
CHLORIDE: 103 meq/L (ref 96–112)
CO2: 28 meq/L (ref 19–32)
CREATININE: 0.8 mg/dL (ref 0.4–1.5)
Calcium: 8.5 mg/dL (ref 8.4–10.5)
GFR: 105.11 mL/min (ref >60.00)
Glucose, Bld: 107 mg/dL — ABNORMAL HIGH (ref 70–99)
Potassium: 4 mEq/L (ref 3.5–5.1)
Sodium: 135 mEq/L (ref 135–145)

## 2014-06-26 LAB — LIPID PANEL
Cholesterol: 87 mg/dL (ref 0–200)
HDL: 32.9 mg/dL — AB (ref >39.00)
LDL Cholesterol: 39 mg/dL (ref 0–99)
NonHDL: 54.1
TRIGLYCERIDES: 75 mg/dL (ref 0.0–149.0)
Total CHOL/HDL Ratio: 3
VLDL: 15 mg/dL (ref 0.0–40.0)

## 2014-06-26 LAB — HEPATIC FUNCTION PANEL
ALT: 14 U/L (ref 0–53)
AST: 21 U/L (ref 0–37)
Albumin: 3.5 g/dL (ref 3.5–5.2)
Alkaline Phosphatase: 94 U/L (ref 39–117)
BILIRUBIN DIRECT: 0 mg/dL (ref 0.0–0.3)
TOTAL PROTEIN: 7.3 g/dL (ref 6.0–8.3)
Total Bilirubin: 0.6 mg/dL (ref 0.2–1.2)

## 2014-07-22 ENCOUNTER — Ambulatory Visit: Payer: Medicare Other | Admitting: Pulmonary Disease

## 2014-08-07 ENCOUNTER — Encounter (HOSPITAL_COMMUNITY): Payer: Self-pay | Admitting: Cardiovascular Disease

## 2014-08-27 ENCOUNTER — Emergency Department (HOSPITAL_COMMUNITY)
Admission: EM | Admit: 2014-08-27 | Discharge: 2014-08-27 | Disposition: A | Discharge status: Home / Self Care | Payer: Medicare Other | Attending: Emergency Medicine | Admitting: Emergency Medicine

## 2014-08-27 ENCOUNTER — Encounter (HOSPITAL_COMMUNITY): Payer: Self-pay | Admitting: Nurse Practitioner

## 2014-08-27 ENCOUNTER — Ambulatory Visit (HOSPITAL_BASED_OUTPATIENT_CLINIC_OR_DEPARTMENT_OTHER): Payer: Medicare Other | Attending: Pulmonary Disease | Admitting: Sleep Medicine

## 2014-08-27 VITALS — Ht 68.0 in | Wt 244.0 lb

## 2014-08-27 DIAGNOSIS — J45909 Unspecified asthma, uncomplicated: Secondary | ICD-10-CM | POA: Insufficient documentation

## 2014-08-27 DIAGNOSIS — Z9889 Other specified postprocedural states: Secondary | ICD-10-CM | POA: Diagnosis not present

## 2014-08-27 DIAGNOSIS — Z951 Presence of aortocoronary bypass graft: Secondary | ICD-10-CM | POA: Insufficient documentation

## 2014-08-27 DIAGNOSIS — G4730 Sleep apnea, unspecified: Secondary | ICD-10-CM | POA: Diagnosis not present

## 2014-08-27 DIAGNOSIS — I2510 Atherosclerotic heart disease of native coronary artery without angina pectoris: Secondary | ICD-10-CM | POA: Diagnosis not present

## 2014-08-27 DIAGNOSIS — E785 Hyperlipidemia, unspecified: Secondary | ICD-10-CM | POA: Insufficient documentation

## 2014-08-27 DIAGNOSIS — Z79899 Other long term (current) drug therapy: Secondary | ICD-10-CM | POA: Insufficient documentation

## 2014-08-27 DIAGNOSIS — Z955 Presence of coronary angioplasty implant and graft: Secondary | ICD-10-CM | POA: Insufficient documentation

## 2014-08-27 DIAGNOSIS — Z87448 Personal history of other diseases of urinary system: Secondary | ICD-10-CM | POA: Insufficient documentation

## 2014-08-27 DIAGNOSIS — Z6837 Body mass index (BMI) 37.0-37.9, adult: Secondary | ICD-10-CM | POA: Diagnosis not present

## 2014-08-27 DIAGNOSIS — Z87820 Personal history of traumatic brain injury: Secondary | ICD-10-CM | POA: Insufficient documentation

## 2014-08-27 DIAGNOSIS — Z87891 Personal history of nicotine dependence: Secondary | ICD-10-CM | POA: Insufficient documentation

## 2014-08-27 DIAGNOSIS — E119 Type 2 diabetes mellitus without complications: Secondary | ICD-10-CM | POA: Diagnosis not present

## 2014-08-27 DIAGNOSIS — G4710 Hypersomnia, unspecified: Secondary | ICD-10-CM | POA: Insufficient documentation

## 2014-08-27 DIAGNOSIS — I10 Essential (primary) hypertension: Secondary | ICD-10-CM | POA: Diagnosis not present

## 2014-08-27 DIAGNOSIS — Z87442 Personal history of urinary calculi: Secondary | ICD-10-CM | POA: Insufficient documentation

## 2014-08-27 DIAGNOSIS — G4733 Obstructive sleep apnea (adult) (pediatric): Secondary | ICD-10-CM

## 2014-08-27 DIAGNOSIS — M1990 Unspecified osteoarthritis, unspecified site: Secondary | ICD-10-CM | POA: Diagnosis not present

## 2014-08-27 DIAGNOSIS — D509 Iron deficiency anemia, unspecified: Secondary | ICD-10-CM | POA: Diagnosis not present

## 2014-08-27 DIAGNOSIS — K219 Gastro-esophageal reflux disease without esophagitis: Secondary | ICD-10-CM | POA: Diagnosis not present

## 2014-08-27 DIAGNOSIS — G8929 Other chronic pain: Secondary | ICD-10-CM | POA: Insufficient documentation

## 2014-08-27 DIAGNOSIS — Z7982 Long term (current) use of aspirin: Secondary | ICD-10-CM | POA: Diagnosis not present

## 2014-08-27 DIAGNOSIS — E1165 Type 2 diabetes mellitus with hyperglycemia: Secondary | ICD-10-CM | POA: Diagnosis present

## 2014-08-27 LAB — CBG MONITORING, ED
GLUCOSE-CAPILLARY: 103 mg/dL — AB (ref 70–99)
Glucose-Capillary: 131 mg/dL — ABNORMAL HIGH (ref 70–99)

## 2014-08-27 NOTE — ED Provider Notes (Signed)
CSN: [id number]     Arrival date & time 08/27/14  1228 History   First MD Initiated Contact with Patient 08/27/14 1309     Chief Complaint  Patient presents with  . Hyperglycemia     (Consider location/radiation/quality/duration/timing/severity/associated sxs/prior Treatment) HPI   PCP: Gwendolyn Grant, MD  [name] is a 68 y.o.male with a significant PMH of hypertension, coronary artery disease, type 2 diabetes, GERD, hyperlipidemia, hepatic cirrhosis, asthma, arthritis, chronic low back pain presents to the ER with complaints of elevated glucose. He reports that he is supposed to be getting a new glucometer and that his old one has been "acting out". Last night his glucose was shown to be greater than 400 as well as this morning. He voices that he does not believe is accurate because he does not feel "dizzy headed, nauseous, running to the bathroom or feeling short of breath" at all. Patient and family member reports that he is feeling just fine is a sugar was not elevated.  Blood pressure 105/50, pulse 98, temperature 98.3 F (36.8 C), temperature source Oral, resp. rate 17, SpO2 98 %.     Past Medical History  Diagnosis Date  . Unspecified essential hypertension   . Coronary artery disease     a. 05/2012 Cath/PCI: LM 40ost, 50-60d, LAD 99p ruptured plaque (3.0x28 DES), LCX 50p/m, RCA 30-40p, 25m, EF 65-70%.  . Type 2 diabetes mellitus   . GERD (gastroesophageal reflux disease)   . Phimosis     a. s/p circumcision 2015.  Marland Kitchen Hyperlipidemia   . History of concussion     1976--  NO RESIDUAL  . Iron deficiency anemia   . History of GI bleed     a. UGIB 07/2012;  b. 01/2014 admission with GIB/FOB stool req 1U prbc's->EGD showed portal gastropathy, barrett's esoph, and chronic active h. pylori gastritis.  Marland Kitchen History of gout     2007 &  2008  LEFT LEG-- NO ISSUE SINCE  . Chronic low back pain   . OSA (obstructive sleep apnea)     PULMOLOGIST-  DR CLANCE--  MODERATE OSA   STARTED CPAP 2012--  BUT CURRENTLY HAS NOT USED PAST 6 MONTHS  . Hepatic cirrhosis     a. Dx 01/2014 - CT a/p   . Kidney stones   . Asthma   . History of blood transfusion     "related to bleeding ulcers"  . Arthritis     "joints tighten up sometimes" (03/27/2104)  . OA (osteoarthritis of spine)     LOWER BACK--  INTERMITTANT LEFT LEG NUMBNESS  . Chronic lower back pain    Past Surgical History  Procedure Laterality Date  . Esophagogastroduodenoscopy  08/15/2012    Procedure: ESOPHAGOGASTRODUODENOSCOPY (EGD);  Surgeon: Wonda Horner, MD;  Location: [facility] ENDOSCOPY;  Service: Endoscopy;  Laterality: N/A;  . Ankle fracture surgery Right 1989    "plate put in"  . Laparoscopic umbilical hernia repair w/ mesh  06-06-2011  . Open appendectomy w/ partial cecectomy  05-16-2004  . Dobutamine stress echo  06-08-2012    MODERATE HYPOKINESIS/ ISCHEMIA MID INFERIOR WALL  . Nephrolithotomy  1990'S  . Circumcision N/A 09/09/2013    Procedure: CIRCUMCISION ADULT;  Surgeon: Bernestine Amass, MD;  Location: [facility];  Service: Urology;  Laterality: N/A;  . Esophagogastroduodenoscopy N/A 02/17/2014    Procedure: ESOPHAGOGASTRODUODENOSCOPY (EGD);  Surgeon: Jeryl Columbia, MD;  Location: [facility] ENDOSCOPY;  Service: Endoscopy;  Laterality: N/A;  . Hernia repair    .  Appendectomy  05-16-2004    open  . Colectomy  05-16-2004  . Coronary angioplasty with stent placement  06/28/2012  DR COOPER    PCI W/  X1 DES to [place]. LAD/  LM  40% OSTIAL & 50-60% DISTAL /  50% PROX LCX/  30-40% PROX RCA & 50% MID RCA/   LVEF 65-70%  . Coronary artery bypass graft N/A 03/31/2014    Procedure: CORONARY ARTERY BYPASS GRAFTING (CABG) times 3 using left internal mammary artery and right saphenous vein.;  Surgeon: Melrose Nakayama, MD;  Location: [place];  Service: Open Heart Surgery;  Laterality: N/A;  . Intraoperative transesophageal echocardiogram N/A 03/31/2014    Procedure: INTRAOPERATIVE TRANSESOPHAGEAL  ECHOCARDIOGRAM;  Surgeon: Melrose Nakayama, MD;  Location: [place];  Service: Open Heart Surgery;  Laterality: N/A;  . Percutaneous coronary stent intervention (pci-s) N/A 06/28/2012    Procedure: PERCUTANEOUS CORONARY STENT INTERVENTION (PCI-S);  Surgeon: Sherren Mocha, MD;  Location: [facility] CATH LAB;  Service: Cardiovascular;  Laterality: N/A;  . Left heart catheterization with coronary angiogram N/A 03/28/2014    Procedure: LEFT HEART CATHETERIZATION WITH CORONARY ANGIOGRAM;  Surgeon: Sinclair Grooms, MD;  Location[facility] C[facility] LAB;  Service: Cardiovascular;  Laterality: N/A;   Family History  Problem Relation Age of Onset  . Coronary artery disease    . Diabetes    . Colon cancer    . Lung cancer Sister   . Cancer Sister     lung  . Cancer Brother     lung  . Cancer Sister     lung  . Cancer Father     died in his 40s.  . Cancer Mother    History  Substance Use Topics  . Smoking status: Former Smoker -- 2.00 packs/day for 40 years    Types: Cigarettes    Quit date: 08/29/1996  . Smokeless tobacco: Never Used  . Alcohol Use: Yes     Comment: 03/27/2014 "stopped frinking ~ 2 months ago; drank heavily before that"    Review of Systems  10 Systems reviewed and are negative for acute change except as noted in the HPI.     Allergies  Review of patient's allergies indicates no known allergies.  Home Medications   Prior to Admission medications   Medication Sig Start Date End Date Taking? Authorizing Provider  aspirin EC 81 MG tablet Take 81 mg by mouth 2 (two) times daily.   Yes Historical Provider, MD  atorvastatin (LIPITOR) 80 MG tablet Take 80 mg by mouth daily.   Yes Historical Provider, MD  ferrous sulfate 325 (65 FE) MG tablet Take 325 mg by mouth 2 (two) times daily with a meal.    Yes Historical Provider, MD  furosemide (LASIX) 20 MG tablet Take 40 mg by mouth daily.   Yes Historical Provider, MD  losartan (COZAAR) 50 MG tablet Take 50 mg by mouth daily.   Yes  Historical Provider, MD  metFORMIN (GLUCOPHAGE) 500 MG tablet Take 500 mg by mouth 2 (two) times daily with a meal.   Yes Historical Provider, MD  metoprolol tartrate (LOPRESSOR) 12.5 mg TABS tablet Take 0.5 tablets (12.5 mg total) by mouth 2 (two) times daily. 05/06/14  Yes Melrose Nakayama, MD  PRESCRIPTION MEDICATION Take 1 tablet by mouth 2 (two) times daily. Potassium-containing combination pill   Yes Historical Provider, MD  ranitidine (ZANTAC) 150 MG capsule Take 150 mg by mouth 2 (two) times daily.   Yes Historical Provider, MD  furosemide (  LASIX) 40 MG tablet Take 1 tablet (40 mg total) by mouth daily. TAKE 1 TABLET 40 MG TWICE A DAY Patient not taking: Reported on 08/27/2014 05/14/14   Imogene Burn, PA-C  losartan (COZAAR) 25 MG tablet Take 1 tablet (25 mg total) by mouth daily. Patient not taking: Reported on 08/27/2014 02/18/14   Jonetta Osgood, MD   BP 105/50 mmHg  Pulse 98  Temp(Src) 98.3 F (36.8 C) (Oral)  Resp 17  SpO2 98% Physical Exam  Constitutional: He appears well-developed and well-nourished. No distress.  HENT:  Head: Normocephalic and atraumatic.  Eyes: Pupils are equal, round, and reactive to light.  Neck: Normal range of motion. Neck supple.  Cardiovascular: Normal rate and regular rhythm.   Pulmonary/Chest: Effort normal.  Abdominal: Soft.  Neurological: He is alert.  Skin: Skin is warm and dry.  Nursing note and vitals reviewed.   ED Course  Procedures (including critical care time) Labs Review Labs Reviewed  CBG MONITORING, ED - Abnormal; Notable for the following:    Glucose-Capillary 131 (*)    All other components within normal limits  CBG MONITORING, ED - Abnormal; Notable for the following:    Glucose-Capillary 103 (*)    All other components within normal limits    Imaging Review No results found.   EKG Interpretation None      MDM   Final diagnoses:  Type 2 diabetes mellitus without complication    CBG is 449 on  arrival and on recheck it is 103. Pt is due for a new glucometer to come in the mail soon. I recommend he go to his PCP clinic for daily checks until unit arrives. He has no other medical complaints and no other work-up or intervention is needed at this time.  68 y.o.[name]'s evaluation in the Emergency Department is complete. It has been determined that no acute conditions requiring further emergency intervention are present at this time. The patient/guardian have been advised of the diagnosis and plan. We have discussed signs and symptoms that warrant return to the ED, such as changes or worsening in symptoms.  Vital signs are stable at discharge. Filed Vitals:   08/27/14 1321  BP: 105/50  Pulse: 98  Temp:   Resp:     Patient/guardian has voiced understanding and agreed to follow-up with the PCP or specialist.      Linus Mako, PA-C 08/27/14 [place], MD 08/27/14 [phone]

## 2014-08-27 NOTE — ED Notes (Signed)
He reports his glucose was 444 this am on his home glucometer. He does not feel bad and denies any complaints. He is taking his diabetes meds as directed. He states the his glucometer is old and may be working incorrectly but wouldk like to have his glucose checked.

## 2014-08-27 NOTE — Discharge Instructions (Signed)
Blood Glucose Monitoring Monitoring your blood glucose (also know as blood sugar) helps you to manage your diabetes. It also helps you and your health care provider monitor your diabetes and determine how well your treatment plan is working. WHY SHOULD YOU MONITOR YOUR BLOOD GLUCOSE?  It can help you understand how food, exercise, and medicine affect your blood glucose.  It allows you to know what your blood glucose is at any given moment. You can quickly tell if you are having low blood glucose (hypoglycemia) or high blood glucose (hyperglycemia).  It can help you and your health care provider know how to adjust your medicines.  It can help you understand how to manage an illness or adjust medicine for exercise. WHEN SHOULD YOU TEST? Your health care provider will help you decide how often you should check your blood glucose. This may depend on the type of diabetes you have, your diabetes control, or the types of medicines you are taking. Be sure to write down all of your blood glucose readings so that this information can be reviewed with your health care provider. See below for examples of testing times that your health care provider may suggest. Type 1 Diabetes  Test 4 times a day if you are in good control, using an insulin pump, or perform multiple daily injections.  If your diabetes is not well controlled or if you are sick, you may need to monitor more often.  It is a good idea to also monitor:  Before and after exercise.  Between meals and 2 hours after a meal.  Occasionally between 2:00 a.m. and 3:00 a.m. Type 2 Diabetes  It can vary with each person, but generally, if you are on insulin, test 4 times a day.  If you take medicines by mouth (orally), test 2 times a day.  If you are on a controlled diet, test once a day.  If your diabetes is not well controlled or if you are sick, you may need to monitor more often. HOW TO MONITOR YOUR BLOOD GLUCOSE Supplies  Needed  Blood glucose meter.  Test strips for your meter. Each meter has its own strips. You must use the strips that go with your own meter.  A pricking needle (lancet).  A device that holds the lancet (lancing device).  A journal or log book to write down your results. Procedure  Wash your hands with soap and water. Alcohol is not preferred.  Prick the side of your finger (not the tip) with the lancet.  Gently milk the finger until a small drop of blood appears.  Follow the instructions that come with your meter for inserting the test strip, applying blood to the strip, and using your blood glucose meter. Other Areas to Get Blood for Testing Some meters allow you to use other areas of your body (other than your finger) to test your blood. These areas are called alternative sites. The most common alternative sites are:  The forearm.  The thigh.  The back area of the lower leg.  The palm of the hand. The blood flow in these areas is slower. Therefore, the blood glucose values you get may be delayed, and the numbers are different from what you would get from your fingers. Do not use alternative sites if you think you are having hypoglycemia. Your reading will not be accurate. Always use a finger if you are having hypoglycemia. Also, if you cannot feel your lows (hypoglycemia unawareness), always use your fingers for your  blood glucose checks. ADDITIONAL TIPS FOR GLUCOSE MONITORING  Do not reuse lancets.  Always carry your supplies with you.  All blood glucose meters have a 24-hour "hotline" number to call if you have questions or need help.  Adjust (calibrate) your blood glucose meter with a control solution after finishing a few boxes of strips. BLOOD GLUCOSE RECORD KEEPING It is a good idea to keep a daily record or log of your blood glucose readings. Most glucose meters, if not all, keep your glucose records stored in the meter. Some meters come with the ability to download  your records to your home computer. Keeping a record of your blood glucose readings is especially helpful if you are wanting to look for patterns. Make notes to go along with the blood glucose readings because you might forget what happened at that exact time. Keeping good records helps you and your health care provider to work together to achieve good diabetes management.  Document Released: 08/18/2003 Document Revised: 12/30/2013 Document Reviewed: 01/07/2013 [facility] Patient Information 2015 [place], [place]. This information is not intended to replace advice given to you by your health care provider. Make sure you discuss any questions you have with your health care provider.

## 2014-09-01 LAB — CBG MONITORING, ED: Glucose-Capillary: 123 mg/dL — ABNORMAL HIGH (ref 70–99)

## 2014-09-04 DIAGNOSIS — G4733 Obstructive sleep apnea (adult) (pediatric): Secondary | ICD-10-CM

## 2014-09-04 NOTE — Sleep Study (Signed)
   NAME: [name] DATE OF BIRTH:  1945/09/20 MEDICAL RECORD NUMBER [id number]  LOCATION: [facility]  PHYSICIAN: [place] OF STUDY: 08/27/2014  SLEEP STUDY TYPE: Nocturnal Polysomnogram               REFERRING PHYSICIAN: Clance, Armando Reichert, MD  INDICATION FOR STUDY: Hypersomnia with sleep apnea  EPWORTH SLEEPINESS SCORE:  6 HEIGHT: 5\' 8"  (172.7 cm)  WEIGHT: 244 lb (110.678 kg)    Body mass index is 37.11 kg/(m^2).  NECK SIZE:   in.  MEDICATIONS: Reviewed in the sleep record  SLEEP ARCHITECTURE: The patient was found to have a total sleep time of 349 minutes, with decreased slow-wave sleep for age, as well as decreased REM. Sleep onset latency was mildly prolonged at 34 minutes, and REM onset was normal at 71 minutes. Sleep efficiency was moderately reduced at 79%.  RESPIRATORY DATA: The patient underwent a split night study where he was found to have 20 apneas and 19 obstructive hypopneas in the first 137 minutes of sleep. This gave him an AHI during the diagnostic portion of the study of 17 events per hour. The events occurred primarily in the supine position, and there was moderate snoring noted throughout. By protocol, the patient was then fitted with a small Fisher Paykel Simplus full face mask. His pressure was then started at a proximally 5 cm of water, and increased as high as 9 cm with poor tolerance on the final setting. He was then changed to bilevel, and titrated to a final setting of 10/6. However, it should be noted that he achieved no REM or supine sleep on that setting.  OXYGEN DATA: There was transient oxygen desaturation as low as 84% prior to obtaining near optimal pressure.  CARDIAC DATA: No clinically significant arrhythmias were noted  MOVEMENT/PARASOMNIA: The patient was found to have large numbers of periodic limb movements but no significant arousal or awakenings. These occurred primarily at the very end of the study. There were  no abnormal behaviors seen.  IMPRESSION/ RECOMMENDATION:    1) split-night study reveals mild to moderate obstructive sleep apnea/hypopnea syndrome, with an AHI of 17 events per hour and oxygen desaturation as low as 84% during the diagnostic portion of the study. The patient was then tried on C Pap with a small Fisher Paykel Simplus full face mask, but had poor tolerance because of pressure. He was then changed to bilevel and titrated as high as 10/6, but did not reach and optimal pressure because of a lack of REM or supine sleep. Given the fact that he needed 9 cm of water of C Pap in order to control his events, he will need at least that pressure for his EPAP. I would recommend that he start on 12/9 for his treatment pressure. The patient should also work aggressively on weight loss.    [name], American Board of Sleep Medicine  ELECTRONICALLY SIGNED ON:  09/04/2014, 1:31 PM [place] PH: [phone]   FX: [phone] [place]

## 2014-09-04 NOTE — Progress Notes (Signed)
Pt needs ov to review sleep study 

## 2014-09-05 NOTE — Progress Notes (Signed)
lmomtcb x1 on both #'s listed

## 2014-09-05 NOTE — Progress Notes (Signed)
Called pt and appt scheduled to come in monday

## 2014-09-08 ENCOUNTER — Encounter: Payer: Self-pay | Admitting: Pulmonary Disease

## 2014-09-08 ENCOUNTER — Ambulatory Visit (INDEPENDENT_AMBULATORY_CARE_PROVIDER_SITE_OTHER): Payer: PPO | Admitting: Pulmonary Disease

## 2014-09-08 DIAGNOSIS — G4733 Obstructive sleep apnea (adult) (pediatric): Secondary | ICD-10-CM

## 2014-09-08 NOTE — Patient Instructions (Signed)
Will get you a bipap machine, and set on 12/9.  Please let us know if you are having issues with this. Keep up with mask cushion changes and supplies. Work on weight loss followup with me in 65mos if you are doing well.

## 2014-09-08 NOTE — Assessment & Plan Note (Signed)
The patient has been completely intolerant to CPAP, and recently underwent a split night study which verified this, and also showed that he does well with BiPAP. It appears that he will need a pressure of 12/9, but ultimately he may need the auto setting. The patient is willing to give this a try, and I also encouraged him to work aggressively on weight loss.

## 2014-09-08 NOTE — Progress Notes (Signed)
   Subjective:    Patient ID: [name], male    DOB: [birth year], 69 y.o.   MRN: [id number]  HPI Patient comes in today for follow-up of his recent sleep study. Been totally intolerant of C Pap, and underwent a split night study where he was found to have an AHI of 17 events per hour. He was initially tried on C Pap with very poor tolerance, and was changed to bilevel with an excellent response. It appears that he needs a pressure of 12/9. I have reviewed the sleep study with the patient in detail, and answered all of his questions.   Review of Systems  Constitutional: Negative for fever and unexpected weight change.  HENT: Negative for congestion, dental problem, ear pain, nosebleeds, postnasal drip, rhinorrhea, sinus pressure, sneezing, sore throat and trouble swallowing.   Eyes: Negative for redness and itching.  Respiratory: Negative for cough, chest tightness, shortness of breath and wheezing.   Cardiovascular: Negative for palpitations and leg swelling.  Gastrointestinal: Negative for nausea and vomiting.  Genitourinary: Negative for dysuria.  Musculoskeletal: Negative for joint swelling.  Skin: Negative for rash.  Neurological: Negative for headaches.  Hematological: Does not bruise/bleed easily.  Psychiatric/Behavioral: Negative for dysphoric mood. The patient is not nervous/anxious.        Objective:   Physical Exam Obese male in no acute distress Nose without purulence or discharge noted No skin breakdown or pressure necrosis from the C Pap mask Neck without lymphadenopathy or thyromegaly Lower extremities with edema noted, no cyanosis Alert and oriented, moves all 4 extremities.       Assessment & Plan:

## 2014-09-25 ENCOUNTER — Encounter: Payer: Self-pay | Admitting: Cardiovascular Disease

## 2014-09-25 ENCOUNTER — Ambulatory Visit (INDEPENDENT_AMBULATORY_CARE_PROVIDER_SITE_OTHER): Payer: PPO | Admitting: Cardiovascular Disease

## 2014-09-25 VITALS — BP 118/68 | HR 87 | Ht 68.0 in | Wt 252.0 lb

## 2014-09-25 DIAGNOSIS — E785 Hyperlipidemia, unspecified: Secondary | ICD-10-CM

## 2014-09-25 NOTE — Patient Instructions (Signed)
Your physician recommends that you continue on your current medications as directed. Please refer to the Current Medication list given to you today.  Your physician recommends that you return for lab work in: in 6 months. Please do not eat or drink after midnight the night before labs are drawn.  Your physician wants you to follow-up in: 6 months. You will receive a reminder letter in the mail two months in advance. If you don't receive a letter, please call our office to schedule the follow-up appointment.

## 2014-09-25 NOTE — Progress Notes (Signed)
Cardiology Office Note   Date:  09/25/2014   ID:  [name], DOB [birth year], MRN [id number]  PCP:  Gwendolyn Grant, MD  Cardiologist:   Acie Fredrickson Wonda Cheng, MD   Chief Complaint  Patient presents with  . Follow-up    cad      History of Present Illness: [name] is a 69 y.o. male who presents for follow up of his CAD   Expand All Collapse All      [name] Date of Birth[birth year]   [place] [address], [place], [place], [place], [place] [address] [phone]  Fax [phone]  Problem List: 1. CAD - 3.0 x 28 mm drug-eluting stent (06-28-12), s/p CABG  2. Indigestion 3. Anemia   History of Present Illness:  [name] is a 69 yo with hx of indigestion / chest pain that develops with walking. The pain is a "ache" and lasts as long as he is walking. It causes him some dyspnea. It typically goes away when he stops. He has lots of belching. Hx of asthma. Hx of smoking in the past. No regular exercise. He is now retired. He gets a little exercise every day.  He had a dobutamine echo that revealed mild ischemia in the inferior wall.   He had a cardiac cath which revealed a tight irregular LAD stenosis. He had PTCA and stenting of his LAD by Dr. Burt Knack.  We have advised him to stop eating fried foods. He still is eating fried potatoes on a regular basis.   Jan 14, 2013:  [name] has done [name] from a cardiac standpoint. He is having some problems with anemia. He was scheduled have a colonoscopy tomorrow but I did not want to stop the Effient and aspirin. The colonoscopy was canceled.  Nov. 17, 2014:  [name] is doing [name]. He is now a year out  from his stenting and can stop the Effient for his colonoscopy. He does some exercise. He has completely ignored my dietary suggestions from previous visits. Still eats everything that he wants to. Having some abdominal pains.   Oct. 28, 2015:  [name] is doing OK. He's had a hospital admission on July 31 and was found have severe left main disease as [name] as severe coronary disease. He had coronary artery bypass grafting on August 3. (CABG)X3 LIMA-LAD; SVG-OM; SVG-PD  Still has some chest soreness     Jan. 28, 2016:  [name] .  No CP . A bit or soreness from shoveling snow.  No angina while shoveling , just chest wall soreness.  The pain is on the left upper chest. Tender to palpitation. Not related to walking.  Has been present for 4-5 days ( since it snowed) not associated with dyspnea , sweats, or dizziness  No dyspnea.   Trying to stick to his diet. Not as successful during the holidays.     Past Medical History  Diagnosis Date  . Unspecified essential hypertension   . Coronary artery disease     a. 05/2012 Cath/PCI: LM 40ost, 50-60d, LAD 99p ruptured plaque (3.0x28 DES), LCX 50p/m, RCA 30-40p, 16m, EF 65-70%.  . Type 2 diabetes mellitus   . GERD (gastroesophageal reflux disease)   . Phimosis     a. s/p circumcision 2015.  Marland Kitchen Hyperlipidemia   . History of concussion     1976--  NO RESIDUAL  . Iron deficiency anemia   . History of  GI bleed     a. UGIB 07/2012;  b. 01/2014 admission with GIB/FOB stool req 1U prbc's->EGD showed portal gastropathy, barrett's esoph, and chronic active h. pylori gastritis.  Marland Kitchen History of gout     2007 &  2008  LEFT LEG-- NO ISSUE SINCE  . Chronic low back pain   . OSA (obstructive sleep apnea)     PULMOLOGIST-  DR CLANCE--  MODERATE OSA  STARTED CPAP 2012--  BUT CURRENTLY HAS NOT USED PAST 6 MONTHS  . Hepatic cirrhosis     a. Dx 01/2014 - CT a/p   . Kidney stones   . Asthma   . History of blood transfusion     "related to  bleeding ulcers"  . Arthritis     "joints tighten up sometimes" (03/27/2104)  . OA (osteoarthritis of spine)     LOWER BACK--  INTERMITTANT LEFT LEG NUMBNESS  . Chronic lower back pain     Past Surgical History  Procedure Laterality Date  . Esophagogastroduodenoscopy  08/15/2012    Procedure: ESOPHAGOGASTRODUODENOSCOPY (EGD);  Surgeon: Wonda Horner, MD;  Location: [facility] ENDOSCOPY;  Service: Endoscopy;  Laterality: N/A;  . Ankle fracture surgery Right 1989    "plate put in"  . Laparoscopic umbilical hernia repair w/ mesh  06-06-2011  . Open appendectomy w/ partial cecectomy  05-16-2004  . Dobutamine stress echo  06-08-2012    MODERATE HYPOKINESIS/ ISCHEMIA MID INFERIOR WALL  . Nephrolithotomy  1990'S  . Circumcision N/A 09/09/2013    Procedure: CIRCUMCISION ADULT;  Surgeon: Bernestine Amass, MD;  Location: [facility];  Service: Urology;  Laterality: N/A;  . Esophagogastroduodenoscopy N/A 02/17/2014    Procedure: ESOPHAGOGASTRODUODENOSCOPY (EGD);  Surgeon: Jeryl Columbia, MD;  Location: [facility] ENDOSCOPY;  Service: Endoscopy;  Laterality: N/A;  . Hernia repair    . Appendectomy  05-16-2004    open  . Colectomy  05-16-2004  . Coronary angioplasty with stent placement  06/28/2012  DR COOPER    PCI W/  X1 DES to [place]. LAD/  LM  40% OSTIAL & 50-60% DISTAL /  50% PROX LCX/  30-40% PROX RCA & 50% MID RCA/   LVEF 65-70%  . Coronary artery bypass graft N/A 03/31/2014    Procedure: CORONARY ARTERY BYPASS GRAFTING (CABG) times 3 using left internal mammary artery and right saphenous vein.;  Surgeon: Melrose Nakayama, MD;  Location: [place];  Service: Open Heart Surgery;  Laterality: N/A;  . Intraoperative transesophageal echocardiogram N/A 03/31/2014    Procedure: INTRAOPERATIVE TRANSESOPHAGEAL ECHOCARDIOGRAM;  Surgeon: Melrose Nakayama, MD;  Location: [place];  Service: Open Heart Surgery;  Laterality: N/A;  . Percutaneous coronary stent intervention (pci-s) N/A 06/28/2012    Procedure:  PERCUTANEOUS CORONARY STENT INTERVENTION (PCI-S);  Surgeon: Sherren Mocha, MD;  Location: [facility] CATH LAB;  Service: Cardiovascular;  Laterality: N/A;  . Left heart catheterization with coronary angiogram N/A 03/28/2014    Procedure: LEFT HEART CATHETERIZATION WITH CORONARY ANGIOGRAM;  Surgeon: Sinclair Grooms, MD;  Location: [facility] CATH LAB;  Service: Cardiovascular;  Laterality: N/A;     Current Outpatient Prescriptions  Medication Sig Dispense Refill  . aspirin EC 81 MG tablet Take 81 mg by mouth 2 (two) times daily.    Marland Kitchen atorvastatin (LIPITOR) 80 MG tablet Take 80 mg by mouth daily.    . ferrous sulfate 325 (65 FE) MG tablet Take 325 mg by mouth 2 (two) times daily with a meal.     . furosemide (LASIX) 40  MG tablet Take 1 tablet (40 mg total) by mouth daily. TAKE 1 TABLET 40 MG TWICE A DAY 60 tablet 10  . metFORMIN (GLUCOPHAGE) 500 MG tablet Take 500 mg by mouth 2 (two) times daily with a meal.    . PRESCRIPTION MEDICATION Take 1 tablet by mouth 2 (two) times daily. Potassium-containing combination pill    . ranitidine (ZANTAC) 150 MG capsule Take 150 mg by mouth 2 (two) times daily.    Marland Kitchen losartan (COZAAR) 50 MG tablet      No current facility-administered medications for this visit.    Allergies:   Review of patient's allergies indicates no known allergies.    Social History:  The patient  reports that he quit smoking about 18 years ago. His smoking use included Cigarettes. He has a 80 pack-year smoking history. He has never used smokeless tobacco. He reports that he drinks alcohol. He reports that he does not use illicit drugs.   Family History:  The patient's family history includes Cancer in his brother, father, mother, sister, and sister; Colon cancer in an other family member; Coronary artery disease in an other family member; Diabetes in an other family member; Lung cancer in his sister.    ROS:  Please see the history of present illness.    Review of Systems: Constitutional:   denies fever, chills, diaphoresis, appetite change and fatigue.  HEENT: denies photophobia, eye pain, redness, hearing loss, ear pain, congestion, sore throat, rhinorrhea, sneezing, neck pain, neck stiffness and tinnitus.  Respiratory: denies SOB, DOE, cough, chest tightness, and wheezing.  Cardiovascular: admits to chest pain,    Gastrointestinal: denies nausea, vomiting, abdominal pain, diarrhea, constipation, blood in stool.  Genitourinary: denies dysuria, urgency, frequency, hematuria, flank pain and difficulty urinating.  Musculoskeletal: denies  myalgias, back pain, joint swelling, arthralgias and gait problem.   Skin: denies pallor, rash and wound.  Neurological: denies dizziness, seizures, syncope, weakness, light-headedness, numbness and headaches.   Hematological: denies adenopathy, easy bruising, personal or family bleeding history.  Psychiatric/ Behavioral: denies suicidal ideation, mood changes, confusion, nervousness, sleep disturbance and agitation.       All other systems are reviewed and negative.    PHYSICAL EXAM: VS:  BP 118/68 mmHg  Pulse 87  Ht 5\' 8"  (1.727 m)  Wt 252 lb (114.306 kg)  BMI 38.33 kg/m2  SpO2 99% , BMI Body mass index is 38.33 kg/(m^2). GEN: [name] nourished, [name] developed, in no acute distress HEENT: normal Neck: no JVD, carotid bruits, or masses Cardiac: RRR; no murmurs, rubs, or gallops,no edema ,  He has left sided chest wall tenderness  Respiratory:  clear to auscultation bilaterally, normal work of breathing GI: soft, nontender, nondistended, + BS MS: no deformity or atrophy Skin: warm and dry, no rash Neuro:  Strength and sensation are intact Psych: normal   EKG:  EKG is not ordered today.    Recent Labs: 04/01/2014: Magnesium 2.2 04/04/2014: Hemoglobin 8.1*; Platelets 127* 04/18/2014: Pro B Natriuretic peptide (BNP) 164.0* 06/26/2014: ALT 14; BUN 17; Creatinine 0.8; Potassium 4.0; Sodium 135    Lipid Panel    Component Value  Date/Time   CHOL 87 06/26/2014 0859   TRIG 75.0 06/26/2014 0859   HDL 32.90* 06/26/2014 0859   CHOLHDL 3 06/26/2014 0859   VLDL 15.0 06/26/2014 0859   LDLCALC 39 06/26/2014 0859      Wt Readings from Last 3 Encounters:  09/25/14 252 lb (114.306 kg)  09/08/14 247 lb 9.6 oz (112.311 kg)  08/27/14 244  lb (110.678 kg)      Other studies Reviewed: Additional studies/ records that were reviewed today include: . Review of the above records demonstrates:    ASSESSMENT AND PLAN:  1.  CAD : Keondrick is doing [name]. He's not had any episodes of angina. He does have some chest soreness after shoveling snow for the past several days. I think that his chest pain is truly due to chest wall pain and does not represent angina. He's not had any episodes of pain while walking. In addition, the pain is not associated with shortness of breath.  He will continue with his same medications. I'll see him again in 6 months. We'll check fasting labs and an EKG at that time.  2. Essential hypertension:  Blood pressure is [name]-controlled. Continue current medications  3. Diabetes mellitus: Followed by his general medical doctor  4. Obesity:  I've encouraged him to continue to work on her diet and exercise program.  5. Hyperlipidemia: Continue current dose of atorvastatin  Current medicines are reviewed at length with the patient today.  The patient does not have concerns regarding medicines.  The following changes have been made:  no change  Labs/ tests ordered today include:  No orders of the defined types were placed in this encounter.     Disposition:   FU with me in 6 months for OV , fasting labs and ECG     Signed, Nahser, Wonda Cheng, MD  09/25/2014 3:50 PM    [facility] [place], [place], [place]  [address] Phone: [phone]; Fax: [phone]

## 2014-11-03 ENCOUNTER — Encounter (HOSPITAL_COMMUNITY): Payer: Self-pay | Admitting: Emergency Medicine

## 2014-11-03 ENCOUNTER — Emergency Department (HOSPITAL_COMMUNITY)
Admission: EM | Admit: 2014-11-03 | Discharge: 2014-11-03 | Disposition: A | Discharge status: Home / Self Care | Payer: PPO | Attending: Emergency Medicine | Admitting: Emergency Medicine

## 2014-11-03 ENCOUNTER — Emergency Department (HOSPITAL_COMMUNITY): Payer: PPO

## 2014-11-03 DIAGNOSIS — M1990 Unspecified osteoarthritis, unspecified site: Secondary | ICD-10-CM | POA: Insufficient documentation

## 2014-11-03 DIAGNOSIS — Z7982 Long term (current) use of aspirin: Secondary | ICD-10-CM | POA: Insufficient documentation

## 2014-11-03 DIAGNOSIS — Z87442 Personal history of urinary calculi: Secondary | ICD-10-CM | POA: Diagnosis not present

## 2014-11-03 DIAGNOSIS — J45909 Unspecified asthma, uncomplicated: Secondary | ICD-10-CM | POA: Insufficient documentation

## 2014-11-03 DIAGNOSIS — S8391XA Sprain of unspecified site of right knee, initial encounter: Secondary | ICD-10-CM | POA: Insufficient documentation

## 2014-11-03 DIAGNOSIS — Z87891 Personal history of nicotine dependence: Secondary | ICD-10-CM | POA: Insufficient documentation

## 2014-11-03 DIAGNOSIS — Z951 Presence of aortocoronary bypass graft: Secondary | ICD-10-CM | POA: Insufficient documentation

## 2014-11-03 DIAGNOSIS — W010XXA Fall on same level from slipping, tripping and stumbling without subsequent striking against object, initial encounter: Secondary | ICD-10-CM | POA: Diagnosis not present

## 2014-11-03 DIAGNOSIS — Y9389 Activity, other specified: Secondary | ICD-10-CM | POA: Diagnosis not present

## 2014-11-03 DIAGNOSIS — E119 Type 2 diabetes mellitus without complications: Secondary | ICD-10-CM | POA: Insufficient documentation

## 2014-11-03 DIAGNOSIS — Z87448 Personal history of other diseases of urinary system: Secondary | ICD-10-CM | POA: Insufficient documentation

## 2014-11-03 DIAGNOSIS — K219 Gastro-esophageal reflux disease without esophagitis: Secondary | ICD-10-CM | POA: Diagnosis not present

## 2014-11-03 DIAGNOSIS — M479 Spondylosis, unspecified: Secondary | ICD-10-CM | POA: Insufficient documentation

## 2014-11-03 DIAGNOSIS — I10 Essential (primary) hypertension: Secondary | ICD-10-CM | POA: Insufficient documentation

## 2014-11-03 DIAGNOSIS — D649 Anemia, unspecified: Secondary | ICD-10-CM | POA: Insufficient documentation

## 2014-11-03 DIAGNOSIS — G8929 Other chronic pain: Secondary | ICD-10-CM | POA: Insufficient documentation

## 2014-11-03 DIAGNOSIS — Z79899 Other long term (current) drug therapy: Secondary | ICD-10-CM | POA: Insufficient documentation

## 2014-11-03 DIAGNOSIS — S8991XA Unspecified injury of right lower leg, initial encounter: Secondary | ICD-10-CM | POA: Diagnosis present

## 2014-11-03 DIAGNOSIS — Y9289 Other specified places as the place of occurrence of the external cause: Secondary | ICD-10-CM | POA: Insufficient documentation

## 2014-11-03 DIAGNOSIS — Z9861 Coronary angioplasty status: Secondary | ICD-10-CM | POA: Diagnosis not present

## 2014-11-03 DIAGNOSIS — E785 Hyperlipidemia, unspecified: Secondary | ICD-10-CM | POA: Diagnosis not present

## 2014-11-03 DIAGNOSIS — Z9889 Other specified postprocedural states: Secondary | ICD-10-CM | POA: Insufficient documentation

## 2014-11-03 DIAGNOSIS — I2510 Atherosclerotic heart disease of native coronary artery without angina pectoris: Secondary | ICD-10-CM | POA: Diagnosis not present

## 2014-11-03 DIAGNOSIS — Y998 Other external cause status: Secondary | ICD-10-CM | POA: Diagnosis not present

## 2014-11-03 DIAGNOSIS — Z87820 Personal history of traumatic brain injury: Secondary | ICD-10-CM | POA: Diagnosis not present

## 2014-11-03 MED ORDER — TRAMADOL HCL 50 MG PO TABS: 50.0000 mg | ORAL_TABLET | Freq: Four times a day (QID) | ORAL | Status: DC | PRN

## 2014-11-03 NOTE — ED Notes (Signed)
Declined W/C at D/C and was escorted to lobby by RN. 

## 2014-11-03 NOTE — ED Notes (Signed)
Pt c/o right knee pain after trip a fall 1 week ago; pt sts sore today

## 2014-11-03 NOTE — ED Provider Notes (Signed)
69 year old male who complains of right knee pain after a fall one week ago, has been ambulating on this with minimal difficulty, on exam he is able to walk, slight antalgic gait, slight decreased range of motion of the knee, otherwise well-appearing, x-ray show no fracture or significant effusions, the patient will be treated with supportive care for pain control and follow up outpatient.  Medical screening examination/treatment/procedure(s) were conducted as a shared visit with non-physician practitioner(s) and myself.  I personally evaluated the patient during the encounter.  Clinical Impression:   Final diagnoses:  Right knee sprain, initial encounter         Noemi Chapel, MD 11/03/14 2041

## 2014-11-03 NOTE — Discharge Instructions (Signed)
Take tramadol for severe pain as needed. Keep knee elevated. Ice. Follow up with primary care doctor.    Knee Pain The knee is the complex joint between your thigh and your lower leg. It is made up of bones, tendons, ligaments, and cartilage. The bones that make up the knee are:  The femur in the thigh.  The tibia and fibula in the lower leg.  The patella or kneecap riding in the groove on the lower femur. CAUSES  Knee pain is a common complaint with many causes. A few of these causes are:  Injury, such as:  A ruptured ligament or tendon injury.  Torn cartilage.  Medical conditions, such as:  Gout  Arthritis  Infections  Overuse, over training, or overdoing a physical activity. Knee pain can be minor or severe. Knee pain can accompany debilitating injury. Minor knee problems often respond well to self-care measures or get well on their own. More serious injuries may need medical intervention or even surgery. SYMPTOMS The knee is complex. Symptoms of knee problems can vary widely. Some of the problems are:  Pain with movement and weight bearing.  Swelling and tenderness.  Buckling of the knee.  Inability to straighten or extend your knee.  Your knee locks and you cannot straighten it.  Warmth and redness with pain and fever.  Deformity or dislocation of the kneecap. DIAGNOSIS  Determining what is wrong may be very straight forward such as when there is an injury. It can also be challenging because of the complexity of the knee. Tests to make a diagnosis may include:  Your caregiver taking a history and doing a physical exam.  Routine X-rays can be used to rule out other problems. X-rays will not reveal a cartilage tear. Some injuries of the knee can be diagnosed by:  Arthroscopy a surgical technique by which a small video camera is inserted through tiny incisions on the sides of the knee. This procedure is used to examine and repair internal knee joint problems.  Tiny instruments can be used during arthroscopy to repair the torn knee cartilage (meniscus).  Arthrography is a radiology technique. A contrast liquid is directly injected into the knee joint. Internal structures of the knee joint then become visible on X-ray film.  An MRI scan is a non X-ray radiology procedure in which magnetic fields and a computer produce two- or three-dimensional images of the inside of the knee. Cartilage tears are often visible using an MRI scanner. MRI scans have largely replaced arthrography in diagnosing cartilage tears of the knee.  Blood work.  Examination of the fluid that helps to lubricate the knee joint (synovial fluid). This is done by taking a sample out using a needle and a syringe. TREATMENT The treatment of knee problems depends on the cause. Some of these treatments are:  Depending on the injury, proper casting, splinting, surgery, or physical therapy care will be needed.  Give yourself adequate recovery time. Do not overuse your joints. If you begin to get sore during workout routines, back off. Slow down or do fewer repetitions.  For repetitive activities such as cycling or running, maintain your strength and nutrition.  Alternate muscle groups. For example, if you are a weight lifter, work the upper body on one day and the lower body the next.  Either tight or weak muscles do not give the proper support for your knee. Tight or weak muscles do not absorb the stress placed on the knee joint. Keep the muscles surrounding the  knee strong.  Take care of mechanical problems.  If you have flat feet, orthotics or special shoes may help. See your caregiver if you need help.  Arch supports, sometimes with wedges on the inner or outer aspect of the heel, can help. These can shift pressure away from the side of the knee most bothered by osteoarthritis.  A brace called an "unloader" brace also may be used to help ease the pressure on the most arthritic side  of the knee.  If your caregiver has prescribed crutches, braces, wraps or ice, use as directed. The acronym for this is PRICE. This means protection, rest, ice, compression, and elevation.  Nonsteroidal anti-inflammatory drugs (NSAIDs), can help relieve pain. But if taken immediately after an injury, they may actually increase swelling. Take NSAIDs with food in your stomach. Stop them if you develop stomach problems. Do not take these if you have a history of ulcers, stomach pain, or bleeding from the bowel. Do not take without your caregiver's approval if you have problems with fluid retention, heart failure, or kidney problems.  For ongoing knee problems, physical therapy may be helpful.  Glucosamine and chondroitin are over-the-counter dietary supplements. Both may help relieve the pain of osteoarthritis in the knee. These medicines are different from the usual anti-inflammatory drugs. Glucosamine may decrease the rate of cartilage destruction.  Injections of a corticosteroid drug into your knee joint may help reduce the symptoms of an arthritis flare-up. They may provide pain relief that lasts a few months. You may have to wait a few months between injections. The injections do have a small increased risk of infection, water retention, and elevated blood sugar levels.  Hyaluronic acid injected into damaged joints may ease pain and provide lubrication. These injections may work by reducing inflammation. A series of shots may give relief for as long as 6 months.  Topical painkillers. Applying certain ointments to your skin may help relieve the pain and stiffness of osteoarthritis. Ask your pharmacist for suggestions. Many over the-counter products are approved for temporary relief of arthritis pain.  In some countries, doctors often prescribe topical NSAIDs for relief of chronic conditions such as arthritis and tendinitis. A review of treatment with NSAID creams found that they worked as well as  oral medications but without the serious side effects. PREVENTION  Maintain a healthy weight. Extra pounds put more strain on your joints.  Get strong, stay limber. Weak muscles are a common cause of knee injuries. Stretching is important. Include flexibility exercises in your workouts.  Be smart about exercise. If you have osteoarthritis, chronic knee pain or recurring injuries, you may need to change the way you exercise. This does not mean you have to stop being active. If your knees ache after jogging or playing basketball, consider switching to swimming, water aerobics, or other low-impact activities, at least for a few days a week. Sometimes limiting high-impact activities will provide relief.  Make sure your shoes fit well. Choose footwear that is right for your sport.  Protect your knees. Use the proper gear for knee-sensitive activities. Use kneepads when playing volleyball or laying carpet. Buckle your seat belt every time you drive. Most shattered kneecaps occur in car accidents.  Rest when you are tired. SEEK MEDICAL CARE IF:  You have knee pain that is continual and does not seem to be getting better.  SEEK IMMEDIATE MEDICAL CARE IF:  Your knee joint feels hot to the touch and you have a high fever. MAKE SURE YOU:  Understand these instructions.  Will watch your condition.  Will get help right away if you are not doing well or get worse. Document Released: 06/12/2007 Document Revised: 11/07/2011 Document Reviewed: 06/12/2007 [facility] Patient Information 2015 [place], [place]. This information is not intended to replace advice given to you by your health care provider. Make sure you discuss any questions you have with your health care provider.

## 2014-11-03 NOTE — ED Provider Notes (Signed)
CSN: [id number]     Arrival date & time 11/03/14  1242 History   First MD Initiated Contact with Patient 11/03/14 1321     Chief Complaint  Patient presents with  . Knee Pain     (Consider location/radiation/quality/duration/timing/severity/associated sxs/prior Treatment) HPI [name] is a 69 y.o. male with multiple medical conditions, presents to emergency department complaining of right knee pain. Patient states he fell after tripping on a flat ground and states point in the right knee. Since then right knee pain, swelling, pain with bearing weight. Patient states he still walking with no difficulties. Full range of motion of the knee. No pain in hip or ankle. No head injuries No other complaints. Has not been taking anything for it at home. States "i just want to make sure that it is not broken."   Past Medical History  Diagnosis Date  . Unspecified essential hypertension   . Coronary artery disease     a. 05/2012 Cath/PCI: LM 40ost, 50-60d, LAD 99p ruptured plaque (3.0x28 DES), LCX 50p/m, RCA 30-40p, 58m, EF 65-70%.  . Type 2 diabetes mellitus   . GERD (gastroesophageal reflux disease)   . Phimosis     a. s/p circumcision 2015.  Marland Kitchen Hyperlipidemia   . History of concussion     1976--  NO RESIDUAL  . Iron deficiency anemia   . History of GI bleed     a. UGIB 07/2012;  b. 01/2014 admission with GIB/FOB stool req 1U prbc's->EGD showed portal gastropathy, barrett's esoph, and chronic active h. pylori gastritis.  Marland Kitchen History of gout     2007 &  2008  LEFT LEG-- NO ISSUE SINCE  . Chronic low back pain   . OSA (obstructive sleep apnea)     PULMOLOGIST-  DR CLANCE--  MODERATE OSA  STARTED CPAP 2012--  BUT CURRENTLY HAS NOT USED PAST 6 MONTHS  . Hepatic cirrhosis     a. Dx 01/2014 - CT a/p   . Kidney stones   . Asthma   . History of blood transfusion     "related to bleeding ulcers"  . Arthritis     "joints tighten up sometimes" (03/27/2104)  . OA (osteoarthritis of spine)     LOWER  BACK--  INTERMITTANT LEFT LEG NUMBNESS  . Chronic lower back pain    Past Surgical History  Procedure Laterality Date  . Esophagogastroduodenoscopy  08/15/2012    Procedure: ESOPHAGOGASTRODUODENOSCOPY (EGD);  Surgeon: Wonda Horner, MD;  Location: [facility] ENDOSCOPY;  Service: Endoscopy;  Laterality: N/A;  . Ankle fracture surgery Right 1989    "plate put in"  . Laparoscopic umbilical hernia repair w/ mesh  06-06-2011  . Open appendectomy w/ partial cecectomy  05-16-2004  . Dobutamine stress echo  06-08-2012    MODERATE HYPOKINESIS/ ISCHEMIA MID INFERIOR WALL  . Nephrolithotomy  1990'S  . Circumcision N/A 09/09/2013    Procedure: CIRCUMCISION ADULT;  Surgeon: Bernestine Amass, MD;  Location: [facility];  Service: Urology;  Laterality: N/A;  . Esophagogastroduodenoscopy N/A 02/17/2014    Procedure: ESOPHAGOGASTRODUODENOSCOPY (EGD);  Surgeon: Jeryl Columbia, MD;  Location: [facility] ENDOSCOPY;  Service: Endoscopy;  Laterality: N/A;  . Hernia repair    . Appendectomy  05-16-2004    open  . Colectomy  05-16-2004  . Coronary angioplasty with stent placement  06/28/2012  DR COOPER    PCI W/  X1 DES to [place]. LAD/  LM  40% OSTIAL & 50-60% DISTAL /  50% PROX LCX/  30-40% PROX RCA & 50% MID RCA/   LVEF 65-70%  . Coronary artery bypass graft N/A 03/31/2014    Procedure: CORONARY ARTERY BYPASS GRAFTING (CABG) times 3 using left internal mammary artery and right saphenous vein.;  Surgeon: Melrose Nakayama, MD;  Location: [place];  Service: Open Heart Surgery;  Laterality: N/A;  . Intraoperative transesophageal echocardiogram N/A 03/31/2014    Procedure: INTRAOPERATIVE TRANSESOPHAGEAL ECHOCARDIOGRAM;  Surgeon: Melrose Nakayama, MD;  Location: [place];  Service: Open Heart Surgery;  Laterality: N/A;  . Percutaneous coronary stent intervention (pci-s) N/A 06/28/2012    Procedure: PERCUTANEOUS CORONARY STENT INTERVENTION (PCI-S);  Surgeon: Sherren Mocha, MD;  Location: [facility] CATH LAB;  Service:  Cardiovascular;  Laterality: N/A;  . Left heart catheterization with coronary angiogram N/A 03/28/2014    Procedure: LEFT HEART CATHETERIZATION WITH CORONARY ANGIOGRAM;  Surgeon: Sinclair Grooms, MD;  Location: [facility] CATH LAB;  Service: Cardiovascular;  Laterality: N/A;   Family History  Problem Relation Age of Onset  . Coronary artery disease    . Diabetes    . Colon cancer    . Lung cancer Sister   . Cancer Sister     lung  . Cancer Brother     lung  . Cancer Sister     lung  . Cancer Father     died in his 89s.  . Cancer Mother    History  Substance Use Topics  . Smoking status: Former Smoker -- 2.00 packs/day for 40 years    Types: Cigarettes    Quit date: 08/29/1996  . Smokeless tobacco: Never Used  . Alcohol Use: Yes     Comment: 03/27/2014 "stopped frinking ~ 2 months ago; drank heavily before that"    Review of Systems  Constitutional: Negative for fever and chills.  Respiratory: Negative for cough, chest tightness and shortness of breath.   Cardiovascular: Negative for chest pain, palpitations and leg swelling.  Genitourinary: Negative for urgency.  Musculoskeletal: Positive for joint swelling and arthralgias. Negative for back pain, gait problem, neck pain and neck stiffness.  Skin: Negative for rash.  Allergic/Immunologic: Negative for immunocompromised state.  Neurological: Negative for weakness, numbness and headaches.      Allergies  Review of patient's allergies indicates no known allergies.  Home Medications   Prior to Admission medications   Medication Sig Start Date End Date Taking? Authorizing Provider  aspirin EC 81 MG tablet Take 81 mg by mouth 2 (two) times daily.    Historical Provider, MD  atorvastatin (LIPITOR) 80 MG tablet Take 80 mg by mouth daily.    Historical Provider, MD  ferrous sulfate 325 (65 FE) MG tablet Take 325 mg by mouth 2 (two) times daily with a meal.     Historical Provider, MD  furosemide (LASIX) 40 MG tablet Take 1 tablet  (40 mg total) by mouth daily. TAKE 1 TABLET 40 MG TWICE A DAY 05/14/14   Imogene Burn, PA-C  losartan (COZAAR) 50 MG tablet  08/27/14   Historical Provider, MD  metFORMIN (GLUCOPHAGE) 500 MG tablet Take 500 mg by mouth 2 (two) times daily with a meal.    Historical Provider, MD  PRESCRIPTION MEDICATION Take 1 tablet by mouth 2 (two) times daily. Potassium-containing combination pill    Historical Provider, MD  ranitidine (ZANTAC) 150 MG capsule Take 150 mg by mouth 2 (two) times daily.    Historical Provider, MD   BP 153/60 mmHg  Pulse 99  Temp(Src) 98.5 F (36.9 C) (  Oral)  Resp 18  Ht 5\' 8"  (1.727 m)  Wt 258 lb 1 oz (117.056 kg)  BMI 39.25 kg/m2  SpO2 99% Physical Exam  Constitutional: He appears well-developed and well-nourished. No distress.  HENT:  Head: Normocephalic and atraumatic.  Eyes: Conjunctivae are normal.  Neck: Neck supple.  Cardiovascular: Normal rate, regular rhythm and normal heart sounds.   Pulmonary/Chest: Effort normal. No respiratory distress. He has no wheezes. He has no rales.  Musculoskeletal: He exhibits no edema.  Normal appearing right knee. No TTP. Full ROM. Joint stable with negative anterior and posterior drawer signs. No laxity with medial or lateral stress.   Neurological: He is alert.  Skin: Skin is warm and dry.  Nursing note and vitals reviewed.   ED Course  Procedures (including critical care time) Labs Review Labs Reviewed - No data to display  Imaging Review Dg Knee Complete 4 Views Right  11/03/2014   CLINICAL DATA:  Medial knee pain, no known injury  EXAM: RIGHT KNEE - COMPLETE 4+ VIEW  COMPARISON:  None.  FINDINGS: Four views of the right knee submitted. There is narrowing of medial joint compartment. Spurring of medial femoral condyle and medial tibial plateau. Small joint effusion. Narrowing of patellofemoral joint space. No acute fracture or subluxation.  IMPRESSION: No acute fracture or subluxation. Osteoarthritic changes as  described above.   Electronically Signed   By: Lahoma Crocker M.D.   On: 11/03/2014 13:52     EKG Interpretation None      MDM   Final diagnoses:  Right knee sprain, initial encounter    Patient in emergency department with knee pain after a fall one week ago. He is ambulatory on that leg. Joint is stable based on my exam. X-rays showing arthritis otherwise negative. Discussed with Dr. Sabra Heck who has seen the patient as well. Home with tramadol, follow-up with primary care doctor. Advised to keep his knee elevated and ice at home. Patient agreeable to the plan.   Filed Vitals:   11/03/14 1245 11/03/14 1504  BP: 153/60 109/66  Pulse: 99 88  Temp: 98.5 F (36.9 C) 98.6 F (37 C)  TempSrc: Oral Oral  Resp: 18 18  Height: 5\' 8"  (1.727 m)   Weight: 258 lb 1 oz (117.056 kg)   SpO2: 99% 98%       Jeannett Senior, PA-C 11/03/14 1636  Noemi Chapel, MD 11/03/14 2041

## 2014-11-07 ENCOUNTER — Encounter: Payer: Self-pay | Admitting: Family

## 2014-11-07 ENCOUNTER — Ambulatory Visit (INDEPENDENT_AMBULATORY_CARE_PROVIDER_SITE_OTHER): Payer: PPO | Admitting: Family

## 2014-11-07 VITALS — BP 110/62 | HR 96 | Temp 98.0°F | Resp 18 | Ht 68.0 in | Wt 255.1 lb

## 2014-11-07 DIAGNOSIS — S8001XD Contusion of right knee, subsequent encounter: Secondary | ICD-10-CM

## 2014-11-07 DIAGNOSIS — S8000XA Contusion of unspecified knee, initial encounter: Secondary | ICD-10-CM | POA: Insufficient documentation

## 2014-11-07 NOTE — Progress Notes (Signed)
Subjective:    Patient ID: [name], male    DOB: [birth year], 69 y.o.   MRN: [id number]  Chief Complaint  Patient presents with  . Knee Pain    x2 week ago he fell and landed on his right knee, still having pain and swelling when walking    HPI:  [name] is a 69 y.o. male who presents today for follow up.  Associated symptoms of right knee pain started about 2 weeks when he fell at home. He continues to experience the associated symptoms of pain and swelling. He was seen in the ED and x-rays showed no fractures or subluxations of the joint. He was released on tramadol which has not helped with the pain. All ED notes were reviewed in detail and x-rays were viewed independently.   Currently continues to experience the associated symptom of right knee pain primarily located on the anterior-lateral side of his knee. Describes the pain as occasionally Penninger. Intensity of the pain is 2-3/10 and is mainly relieved by walking around. Denies any other OTC medications. He is using a knee brace which seems to help.   No Known Allergies  Current Outpatient Prescriptions on File Prior to Visit  Medication Sig Dispense Refill  . aspirin EC 81 MG tablet Take 81 mg by mouth 2 (two) times daily.    Marland Kitchen atorvastatin (LIPITOR) 80 MG tablet Take 80 mg by mouth daily.    . ferrous sulfate 325 (65 FE) MG tablet Take 325 mg by mouth 2 (two) times daily with a meal.     . furosemide (LASIX) 40 MG tablet Take 1 tablet (40 mg total) by mouth daily. TAKE 1 TABLET 40 MG TWICE A DAY 60 tablet 10  . losartan (COZAAR) 50 MG tablet     . metFORMIN (GLUCOPHAGE) 500 MG tablet Take 500 mg by mouth 2 (two) times daily with a meal.    . PRESCRIPTION MEDICATION Take 1 tablet by mouth 2 (two) times daily. Potassium-containing combination pill    . ranitidine (ZANTAC) 150 MG capsule Take 150 mg by mouth 2 (two) times daily.    . traMADol (ULTRAM) 50 MG tablet Take 1 tablet (50 mg total) by mouth every 6 (six) hours  as needed. 20 tablet 0   No current facility-administered medications on file prior to visit.    Past Medical History  Diagnosis Date  . Unspecified essential hypertension   . Coronary artery disease     a. 05/2012 Cath/PCI: LM 40ost, 50-60d, LAD 99p ruptured plaque (3.0x28 DES), LCX 50p/m, RCA 30-40p, 32m, EF 65-70%.  . Type 2 diabetes mellitus   . GERD (gastroesophageal reflux disease)   . Phimosis     a. s/p circumcision 2015.  Marland Kitchen Hyperlipidemia   . History of concussion     1976--  NO RESIDUAL  . Iron deficiency anemia   . History of GI bleed     a. UGIB 07/2012;  b. 01/2014 admission with GIB/FOB stool req 1U prbc's->EGD showed portal gastropathy, barrett's esoph, and chronic active h. pylori gastritis.  Marland Kitchen History of gout     2007 &  2008  LEFT LEG-- NO ISSUE SINCE  . Chronic low back pain   . OSA (obstructive sleep apnea)     PULMOLOGIST-  DR CLANCE--  MODERATE OSA  STARTED CPAP 2012--  BUT CURRENTLY HAS NOT USED PAST 6 MONTHS  . Hepatic cirrhosis     a. Dx 01/2014 - CT a/p   .  Kidney stones   . Asthma   . History of blood transfusion     "related to bleeding ulcers"  . Arthritis     "joints tighten up sometimes" (03/27/2104)  . OA (osteoarthritis of spine)     LOWER BACK--  INTERMITTANT LEFT LEG NUMBNESS  . Chronic lower back pain     Review of Systems  Musculoskeletal: Positive for joint swelling.       Positive for right knee pain.  Neurological: Negative for numbness.      Objective:    BP 110/62 mmHg  Pulse 96  Temp(Src) 98 F (36.7 C) (Oral)  Resp 18  Ht 5\' 8"  (1.727 m)  Wt 255 lb 1.9 oz (115.722 kg)  BMI 38.80 kg/m2  SpO2 98% Nursing note and vital signs reviewed.  Physical Exam  Constitutional: He is oriented to person, place, and time. He appears well-developed and well-nourished. No distress.  Cardiovascular: Normal rate, regular rhythm, normal heart sounds and intact distal pulses.   Pulmonary/Chest: Effort normal and breath sounds normal.    Musculoskeletal:  No obvious deformity or discoloration noted. Mild edema present throughout. Range of motion is intact and appropriate. Pulses are intact and appropriate. Ligamentous and meniscal testing is negative. Strength is normal.  Neurological: He is alert and oriented to person, place, and time.  Skin: Skin is warm and dry.  Psychiatric: He has a normal mood and affect. His behavior is normal. Judgment and thought content normal.       Assessment & Plan:

## 2014-11-07 NOTE — Progress Notes (Signed)
Pre visit review using our clinic review tool, if applicable. No additional management support is needed unless otherwise documented below in the visit note. 

## 2014-11-07 NOTE — Patient Instructions (Addendum)
Thank you for choosing [facility].  Summary/Instructions:  Please continue to use the knee brace as needed.  Ice as needed for pain for about 20 minutes at a time.  Try Aleve as needed for pain relief and inflammation.   If your symptoms worsen or fail to improve, please contact our office for further instruction, or in case of emergency go directly to the emergency room at the closest medical facility.   Contusion A contusion is a deep bruise. Contusions are the result of an injury that caused bleeding under the skin. The contusion may turn blue, purple, or yellow. Minor injuries will give you a painless contusion, but more severe contusions may stay painful and swollen for a few weeks.  CAUSES  A contusion is usually caused by a blow, trauma, or direct force to an area of the body. SYMPTOMS   Swelling and redness of the injured area.  Bruising of the injured area.  Tenderness and soreness of the injured area.  Pain. DIAGNOSIS  The diagnosis can be made by taking a history and physical exam. An X-ray, CT scan, or MRI may be needed to determine if there were any associated injuries, such as fractures. TREATMENT  Specific treatment will depend on what area of the body was injured. In general, the best treatment for a contusion is resting, icing, elevating, and applying cold compresses to the injured area. Over-the-counter medicines may also be recommended for pain control. Ask your caregiver what the best treatment is for your contusion. HOME CARE INSTRUCTIONS   Put ice on the injured area.  Put ice in a plastic bag.  Place a towel between your skin and the bag.  Leave the ice on for 15-20 minutes, 3-4 times a day, or as directed by your health care provider.  Only take over-the-counter or prescription medicines for pain, discomfort, or fever as directed by your caregiver. Your caregiver may recommend avoiding anti-inflammatory medicines (aspirin, ibuprofen, and  naproxen) for 48 hours because these medicines may increase bruising.  Rest the injured area.  If possible, elevate the injured area to reduce swelling. SEEK IMMEDIATE MEDICAL CARE IF:   You have increased bruising or swelling.  You have pain that is getting worse.  Your swelling or pain is not relieved with medicines. MAKE SURE YOU:   Understand these instructions.  Will watch your condition.  Will get help right away if you are not doing well or get worse. Document Released: 05/25/2005 Document Revised: 08/20/2013 Document Reviewed: 06/20/2011 [facility] Patient Information 2015 [place], [place]. This information is not intended to replace advice given to you by your health care provider. Make sure you discuss any questions you have with your health care provider.

## 2014-11-07 NOTE — Assessment & Plan Note (Addendum)
Symptoms and exam consistent with knee contusion with x-rays negative. Recommend continue icing as needed. Start over-the-counter naproxen as needed for pain and soreness. Continue to wear the knee brace. Follow-up if symptoms worsen or fail to improve.

## 2014-12-27 ENCOUNTER — Other Ambulatory Visit: Payer: Self-pay | Admitting: Internal Medicine

## 2015-01-06 ENCOUNTER — Telehealth: Payer: Self-pay

## 2015-01-06 NOTE — Telephone Encounter (Signed)
LVM for pt to call back as soon as possible.   RE: AWV scheduled with [facility] Coach

## 2015-01-20 ENCOUNTER — Ambulatory Visit (INDEPENDENT_AMBULATORY_CARE_PROVIDER_SITE_OTHER): Payer: PPO | Admitting: Pulmonary Disease

## 2015-01-20 ENCOUNTER — Encounter: Payer: Self-pay | Admitting: Pulmonary Disease

## 2015-01-20 VITALS — BP 116/70 | HR 89 | Temp 98.8°F | Ht 68.0 in | Wt 267.8 lb

## 2015-01-20 DIAGNOSIS — G4733 Obstructive sleep apnea (adult) (pediatric): Secondary | ICD-10-CM | POA: Diagnosis not present

## 2015-01-20 NOTE — Progress Notes (Signed)
   Subjective:    Patient ID: [name], male    DOB: Jul 11, 1946, 69 y.o.   MRN: [id number]  HPI The patient comes in today for follow-up of his obstructive sleep apnea. He is wearing bilevel fairly compliantly by his download, but is having some breakthrough events without mask leak. He feels he is sleeping fairly well with the device, but that he needs more pressure. He is having no issues with his mask fit.   Review of Systems  Constitutional: Negative for fever and unexpected weight change.  HENT: Negative for congestion, dental problem, ear pain, nosebleeds, postnasal drip, rhinorrhea, sinus pressure, sneezing, sore throat and trouble swallowing.   Eyes: Negative for redness and itching.  Respiratory: Negative for cough, chest tightness, shortness of breath and wheezing.   Cardiovascular: Negative for palpitations and leg swelling.  Gastrointestinal: Negative for nausea and vomiting.  Genitourinary: Negative for dysuria.  Musculoskeletal: Negative for joint swelling.  Skin: Negative for rash.  Neurological: Negative for headaches.  Hematological: Does not bruise/bleed easily.  Psychiatric/Behavioral: Negative for dysphoric mood. The patient is not nervous/anxious.        Objective:   Physical Exam Morbidly obese male in no acute distress Nose without purulence or discharge noted Neck without lymphadenopathy or thyromegaly No skin breakdown or pressure necrosis from the C Pap mask Lower extremities with edema noted, no cyanosis Alert and oriented, moves all 4 extremities.       Assessment & Plan:

## 2015-01-20 NOTE — Patient Instructions (Signed)
Continue on your bilevel device, and will increase the pressure to 15/12. Work on weight loss, and keep up with mask changes and supplies. followup with Dr. Halford Chessman in 38mo, but call if having issues with your bilevel machine.

## 2015-01-20 NOTE — Assessment & Plan Note (Signed)
The patient is doing fairly well on his bilevel device, but he is having some breakthrough events and we'll therefore increase his pressure. He is having no significant mask leak. I have encouraged him to wear his C Pap as much as possible, and to work aggressively on weight loss. He will follow-up again in one year.

## 2015-02-27 ENCOUNTER — Ambulatory Visit (INDEPENDENT_AMBULATORY_CARE_PROVIDER_SITE_OTHER): Payer: PPO | Admitting: Cardiovascular Disease

## 2015-02-27 ENCOUNTER — Encounter: Payer: Self-pay | Admitting: Cardiovascular Disease

## 2015-02-27 VITALS — BP 130/68 | HR 65 | Ht 68.0 in | Wt 272.1 lb

## 2015-02-27 DIAGNOSIS — I25810 Atherosclerosis of coronary artery bypass graft(s) without angina pectoris: Secondary | ICD-10-CM | POA: Diagnosis not present

## 2015-02-27 DIAGNOSIS — E785 Hyperlipidemia, unspecified: Secondary | ICD-10-CM

## 2015-02-27 NOTE — Patient Instructions (Addendum)
Medication Instructions:  Your physician recommends that you continue on your current medications as directed. Please refer to the Current Medication list given to you today.   Labwork: Your physician recommends that you return for lab work in: 6 months on the day of or a few days before your office visit with Dr. Nahser.  You will need to FAST for this appointment - nothing to eat or drink after midnight the night before except water.    Testing/Procedures: None Ordered   Follow-Up: Your physician wants you to follow-up in: 6 months with Dr. Nahser.  You will receive a reminder letter in the mail two months in advance. If you don't receive a letter, please call our office to schedule the follow-up appointment.    

## 2015-02-27 NOTE — Progress Notes (Signed)
Cardiology Office Note   Date:  7/[name]/[name]   ID:  [name], DOB 01-21-1946, MRN [id number]  PCP:  Gwendolyn Grant, MD  Cardiologist:   Acie Fredrickson Wonda Cheng, MD   Chief Complaint  Patient presents with  . Follow-up    hyperlipidemia, CAD      History of Present Illness: [name] is a 69 y.o. male who presents for follow up of his CAD   Expand All Collapse All      [name] Date of BirthDecember 09, 1947   [place], [place], [place], [place], [place] [address] [phone]  Fax [phone]  Problem List: [name]. CAD - 3.0 x 28 mm drug-eluting stent (06-28-12), s/p CABG  2. Indigestion 3. Anemia   History of Present Illness:  [name] is a 69 yo with hx of indigestion / chest pain that develops with walking. The pain is a "ache" and lasts as long as he is walking. It causes him some dyspnea. It typically goes away when he stops. He has lots of belching. Hx of asthma. Hx of smoking in the past. No regular exercise. He is now retired. He gets a little exercise every day.  He had a dobutamine echo that revealed mild ischemia in the inferior wall.   He had a cardiac cath which revealed a tight irregular LAD stenosis. He had PTCA and stenting of his LAD by Dr. Burt Knack.  We have advised him to stop eating fried foods. He still is eating fried potatoes on a regular basis.   Jan 14, 2013:  [name] has done well from a cardiac standpoint. He is having some problems with anemia. He was scheduled have a colonoscopy tomorrow but I did not want to stop the Effient and aspirin. The colonoscopy was canceled.  Nov. 17, 2014:  [name] is doing well. He is  now a year out from his stenting and can stop the Effient for his colonoscopy. He does some exercise. He has completely ignored my dietary suggestions from previous visits. Still eats everything that he wants to. Having some abdominal pains.   Oct. 28, 2015:  [name] is doing OK. He's had a hospital admission on John 31 and was found have severe left main disease as well as severe coronary disease. He had coronary artery bypass grafting on August 3. (CABG)X3 LIMA-LAD; SVG-OM; SVG-PD  Still has some chest soreness     Jan. 28, [name]:  [name] is doing well .  No CP . A bit or soreness from shoveling snow.  No angina while shoveling , just chest wall soreness.  The pain is on the left upper chest. Tender to palpitation. Not related to walking.  Has been present for 4-5 days ( since it snowed) not associated with dyspnea , sweats, or dizziness  No dyspnea.   Trying to stick to his diet. Not as successful during the holidays.   [name], [name]: No angina. Has some chest wall tenderness - worse with coughing . Still leg edema     Past Medical History  Diagnosis Date  . Unspecified essential hypertension   . Coronary artery disease     a. 05/2012 Cath/PCI: LM 40ost, 50-60d, LAD 99p ruptured plaque (3.0x28 DES), LCX 50p/m, RCA 30-40p, 70m EF 65-70%.  . Type 2 diabetes mellitus   . GERD (gastroesophageal reflux disease)   . Phimosis     a. s/p circumcision 2015.  .Marland KitchenHyperlipidemia   .  History of concussion     1976--  NO RESIDUAL  . Iron deficiency anemia   . History of GI bleed     a. UGIB 07/2012;  b. 01/2014 admission with GIB/FOB stool req 1U prbc's->EGD showed portal gastropathy, barrett's esoph, and chronic active h. pylori gastritis.  Marland Kitchen History of gout     2007 &  2008  LEFT LEG-- NO ISSUE SINCE  . Chronic low back pain   . OSA (obstructive sleep apnea)     PULMOLOGIST-  DR CLANCE--  MODERATE OSA  STARTED CPAP 2012--  BUT CURRENTLY HAS NOT USED PAST 6 MONTHS  . Hepatic  cirrhosis     a. Dx 01/2014 - CT a/p   . Kidney stones   . Asthma   . History of blood transfusion     "related to bleeding ulcers"  . Arthritis     "joints tighten up sometimes" (03/27/2104)  . OA (osteoarthritis of spine)     LOWER BACK--  INTERMITTANT LEFT LEG NUMBNESS  . Chronic lower back pain     Past Surgical History  Procedure Laterality Date  . Esophagogastroduodenoscopy  08/15/2012    Procedure: ESOPHAGOGASTRODUODENOSCOPY (EGD);  Surgeon: Wonda Horner, MD;  Location: [facility] ENDOSCOPY;  Service: Endoscopy;  Laterality: N/A;  . Ankle fracture surgery Right 1989    "plate put in"  . Laparoscopic umbilical hernia repair w/ mesh  06-06-2011  . Open appendectomy w/ partial cecectomy  05-16-2004  . Dobutamine stress echo  06-08-2012    MODERATE HYPOKINESIS/ ISCHEMIA MID INFERIOR WALL  . Nephrolithotomy  1990'S  . Circumcision N/A [name]/07/2014    Procedure: CIRCUMCISION ADULT;  Surgeon: Bernestine Amass, MD;  Location: [facility];  Service: Urology;  Laterality: N/A;  . Esophagogastroduodenoscopy N/A 02/17/2014    Procedure: ESOPHAGOGASTRODUODENOSCOPY (EGD);  Surgeon: Jeryl Columbia, MD;  Location: [facility] ENDOSCOPY;  Service: Endoscopy;  Laterality: N/A;  . Hernia repair    . Appendectomy  05-16-2004    open  . Colectomy  05-16-2004  . Coronary angioplasty with stent placement  06/28/2012  DR COOPER    PCI W/  X1 DES to [place]. LAD/  LM  40% OSTIAL & 50-60% DISTAL /  50% PROX LCX/  30-40% PROX RCA & 50% MID RCA/   LVEF 65-70%  . Coronary artery bypass graft N/A 03/31/2014    Procedure: CORONARY ARTERY BYPASS GRAFTING (CABG) times 3 using left internal mammary artery and right saphenous vein.;  Surgeon: Melrose Nakayama, MD;  Location: [place];  Service: Open Heart Surgery;  Laterality: N/A;  . Intraoperative transesophageal echocardiogram N/A 03/31/2014    Procedure: INTRAOPERATIVE TRANSESOPHAGEAL ECHOCARDIOGRAM;  Surgeon: Melrose Nakayama, MD;  Location: [place];  Service: Open  Heart Surgery;  Laterality: N/A;  . Percutaneous coronary stent intervention (pci-s) N/A 06/28/2012    Procedure: PERCUTANEOUS CORONARY STENT INTERVENTION (PCI-S);  Surgeon: Sherren Mocha, MD;  Location: [facility] CATH LAB;  Service: Cardiovascular;  Laterality: N/A;  . Left heart catheterization with coronary angiogram N/A 03/28/2014    Procedure: LEFT HEART CATHETERIZATION WITH CORONARY ANGIOGRAM;  Surgeon: Sinclair Grooms, MD;  Location[facility] C[facility] LAB;  Service: Cardiovascular;  Laterality: N/A;     Current Outpatient Prescriptions  Medication Sig Dispense Refill  . aspirin EC 81 MG tablet Take 81 mg by mouth 2 (two) times daily.    Marland Kitchen atorvastatin (LIPITOR) 80 MG tablet TAKE ONE TABLET BY MOUTH ONCE DAILY 30 tablet 2  . ferrous sulfate 325 (65  FE) MG tablet Take 325 mg by mouth 2 (two) times daily with a meal.     . furosemide (LASIX) 40 MG tablet Take [name] tablet (40 mg total) by mouth daily. TAKE [name] TABLET 40 MG TWICE A DAY 60 tablet 10  . losartan (COZAAR) 50 MG tablet     . metFORMIN (GLUCOPHAGE) 500 MG tablet Take 500 mg by mouth 2 (two) times daily with a meal.    . PRESCRIPTION MEDICATION Take [name] tablet by mouth 2 (two) times daily. Potassium-containing combination pill    . ranitidine (ZANTAC) 150 MG capsule Take 150 mg by mouth 2 (two) times daily.    . traMADol (ULTRAM) 50 MG tablet Take [name] tablet (50 mg total) by mouth every 6 (six) hours as needed. 20 tablet 0   No current facility-administered medications for this visit.    Allergies:   Review of patient's allergies indicates no known allergies.    Social History:  The patient  reports that he quit smoking about 18 years ago. His smoking use included Cigarettes. He has a 80 pack-year smoking history. He has never used smokeless tobacco. He reports that he drinks alcohol. He reports that he does not use illicit drugs.   Family History:  The patient's family history includes Cancer in his brother, father, mother, sister, and sister;  Colon cancer in an other family member; Coronary artery disease in an other family member; Diabetes in an other family member; Lung cancer in his sister.    ROS:  Please see the history of present illness.    Review of Systems: Constitutional:  denies fever, chills, diaphoresis, appetite change and fatigue.  HEENT: denies photophobia, eye pain, redness, hearing loss, ear pain, congestion, sore throat, rhinorrhea, sneezing, neck pain, neck stiffness and tinnitus.  Respiratory: denies SOB, DOE, cough, chest tightness, and wheezing.  Cardiovascular: admits to chest pain,    Gastrointestinal: denies nausea, vomiting, abdominal pain, diarrhea, constipation, blood in stool.  Genitourinary: denies dysuria, urgency, frequency, hematuria, flank pain and difficulty urinating.  Musculoskeletal: denies  myalgias, back pain, joint swelling, arthralgias and gait problem.   Skin: denies pallor, rash and wound.  Neurological: denies dizziness, seizures, syncope, weakness, light-headedness, numbness and headaches.   Hematological: denies adenopathy, easy bruising, personal or family bleeding history.  Psychiatric/ Behavioral: denies suicidal ideation, mood changes, confusion, nervousness, sleep disturbance and agitation.       All other systems are reviewed and negative.    PHYSICAL EXAM: VS:  BP 130/68 mmHg  Pulse 65  Ht '5\' 8"'$  ([name].727 m)  Wt 123.433 kg (272 lb [name].9 oz)  BMI 41.39 kg/m2 , BMI Body mass index is 41.39 kg/(m^2). GEN: Well nourished, well developed, in no acute distress HEENT: normal Neck: no JVD, carotid bruits, or masses Cardiac: RRR; no murmurs, rubs, or gallops,  [name]-2 + leg edema ,  He has left sided chest wall tenderness  Respiratory:  clear to auscultation bilaterally, normal work of breathing GI: soft, nontender, nondistended, + BS MS: no deformity or atrophy Skin: warm and dry, no rash Neuro:  Strength and sensation are intact Psych: normal   EKG:  EKG is ordered  today. ECG shows NSR at 65.  , sinus arrhythmia, inc. RBBB.     Recent Labs: 04/01/2014: Magnesium 2.2 04/04/2014: Hemoglobin 8.[name]*; Platelets 127* 04/18/2014: Pro B Natriuretic peptide (BNP) 164.0* 06/26/2014: ALT 14; BUN 17; Creatinine, Ser 0.8; Potassium 4.0; Sodium 135    Lipid Panel    Component Value Date/Time  CHOL 87 06/26/2014 0859   TRIG 75.0 06/26/2014 0859   HDL 32.90* 06/26/2014 0859   CHOLHDL 3 06/26/2014 0859   VLDL 15.0 06/26/2014 0859   LDLCALC 39 06/26/2014 0859      Wt Readings from Last 3 Encounters:  02/27/15 123.433 kg (272 lb [name].9 oz)  01/20/15 121.473 kg (267 lb 12.8 oz)  11/07/14 115.722 kg (255 lb [name].9 oz)     Other studies Reviewed: Additional studies/ records that were reviewed today include: . Review of the above records demonstrates:    ASSESSMENT AND PLAN:  [name].  CAD : Tegan is doing well. He's not had any episodes of angina. He does have some chest soreness after shoveling snow for the past several days. I think that his chest pain is truly due to chest wall pain and does not represent angina. He's not had any episodes of pain while walking. In addition, the pain is not associated with shortness of breath.  He will continue with his same medications. I'll see him again in 6 months. We'll check fasting labs and an EKG at that time.  2. Essential hypertension:  Blood pressure is well-controlled. Continue current medications  3. Diabetes mellitus: Followed by his general medical doctor  4. Obesity:  I've encouraged him to continue to work on her diet and exercise program.  5. Hyperlipidemia: Continue current dose of atorvastatin, check lipids in 6 months   Current medicines are reviewed at length with the patient today.  The patient does not have concerns regarding medicines.  The following changes have been made:  no change  Labs/ tests ordered today include:  No orders of the defined types were placed in this encounter.    Disposition:    FU with me in 6 months for OV , fasting labs and ECG     Signed, Teshia Mahone, Wonda Cheng, MD  7/[name]/[name] 2:09 PM    [address] [place], [place],   [address] Phone: [phone]; Fax: [phone]

## 2015-03-09 ENCOUNTER — Ambulatory Visit: Payer: PPO | Admitting: Pulmonary Disease

## 2015-03-12 ENCOUNTER — Encounter (HOSPITAL_COMMUNITY): Payer: Self-pay | Admitting: Neurology

## 2015-03-12 ENCOUNTER — Emergency Department (HOSPITAL_COMMUNITY)
Admission: EM | Admit: 2015-03-12 | Discharge: 2015-03-12 | Disposition: A | Discharge status: Home / Self Care | Payer: PPO | Attending: Emergency Medicine | Admitting: Emergency Medicine

## 2015-03-12 DIAGNOSIS — G8929 Other chronic pain: Secondary | ICD-10-CM | POA: Insufficient documentation

## 2015-03-12 DIAGNOSIS — Z8669 Personal history of other diseases of the nervous system and sense organs: Secondary | ICD-10-CM | POA: Insufficient documentation

## 2015-03-12 DIAGNOSIS — D509 Iron deficiency anemia, unspecified: Secondary | ICD-10-CM | POA: Diagnosis not present

## 2015-03-12 DIAGNOSIS — H6092 Unspecified otitis externa, left ear: Secondary | ICD-10-CM | POA: Insufficient documentation

## 2015-03-12 DIAGNOSIS — K219 Gastro-esophageal reflux disease without esophagitis: Secondary | ICD-10-CM | POA: Insufficient documentation

## 2015-03-12 DIAGNOSIS — M479 Spondylosis, unspecified: Secondary | ICD-10-CM | POA: Insufficient documentation

## 2015-03-12 DIAGNOSIS — I2510 Atherosclerotic heart disease of native coronary artery without angina pectoris: Secondary | ICD-10-CM | POA: Insufficient documentation

## 2015-03-12 DIAGNOSIS — Z87442 Personal history of urinary calculi: Secondary | ICD-10-CM | POA: Diagnosis not present

## 2015-03-12 DIAGNOSIS — I10 Essential (primary) hypertension: Secondary | ICD-10-CM | POA: Insufficient documentation

## 2015-03-12 DIAGNOSIS — Z87891 Personal history of nicotine dependence: Secondary | ICD-10-CM | POA: Insufficient documentation

## 2015-03-12 DIAGNOSIS — H9202 Otalgia, left ear: Secondary | ICD-10-CM | POA: Diagnosis present

## 2015-03-12 DIAGNOSIS — E785 Hyperlipidemia, unspecified: Secondary | ICD-10-CM | POA: Diagnosis not present

## 2015-03-12 DIAGNOSIS — Z79899 Other long term (current) drug therapy: Secondary | ICD-10-CM | POA: Diagnosis not present

## 2015-03-12 DIAGNOSIS — Z951 Presence of aortocoronary bypass graft: Secondary | ICD-10-CM | POA: Diagnosis not present

## 2015-03-12 DIAGNOSIS — Z87820 Personal history of traumatic brain injury: Secondary | ICD-10-CM | POA: Insufficient documentation

## 2015-03-12 DIAGNOSIS — J45909 Unspecified asthma, uncomplicated: Secondary | ICD-10-CM | POA: Diagnosis not present

## 2015-03-12 DIAGNOSIS — Z9861 Coronary angioplasty status: Secondary | ICD-10-CM | POA: Diagnosis not present

## 2015-03-12 DIAGNOSIS — Z7982 Long term (current) use of aspirin: Secondary | ICD-10-CM | POA: Insufficient documentation

## 2015-03-12 DIAGNOSIS — Z87448 Personal history of other diseases of urinary system: Secondary | ICD-10-CM | POA: Diagnosis not present

## 2015-03-12 DIAGNOSIS — E119 Type 2 diabetes mellitus without complications: Secondary | ICD-10-CM | POA: Diagnosis not present

## 2015-03-12 MED ORDER — CIPROFLOXACIN-HYDROCORTISONE 0.2-1 % OT SUSP: 3.0000 [drp] | Freq: Two times a day (BID) | OTIC | Status: DC

## 2015-03-12 NOTE — ED Provider Notes (Signed)
CSN: [id number]     Arrival date & time 03/12/15  1341 History  This chart was scribed for Comer Locket, PA-C, working with Ernestina Patches, MD by Steva Colder, ED Scribe. The patient was seen in room TR10C/TR10C at 2:50 PM.    Chief Complaint  Patient presents with  . Otalgia       The history is provided by the patient. No language interpreter was used.    HPI Comments: [name] is a 69 y.o. male who presents to the Emergency Department complaining of left ear pain onset 2 days. Pt reports that he has used Q-tips to clean his ear out. He states that he is having associated symptoms of left ear fullness. He states that he has not tried anything for the relief for his symptoms. Denies any hearing changes. He denies HA, vision changes, fevers, chills and any other symptoms. Pt denies any medical issues or allergies to medications. No other aggravating or modifying factors.   Past Medical History  Diagnosis Date  . Unspecified essential hypertension   . Coronary artery disease     a. 05/2012 Cath/PCI: LM 40ost, 50-60d, LAD 99p ruptured plaque (3.0x28 DES), LCX 50p/m, RCA 30-40p, 74m EF 65-70%.  . Type 2 diabetes mellitus   . GERD (gastroesophageal reflux disease)   . Phimosis     a. s/p circumcision 2015.  .Marland KitchenHyperlipidemia   . History of concussion     1976--  NO RESIDUAL  . Iron deficiency anemia   . History of GI bleed     a. UGIB 07/2012;  b. 01/2014 admission with GIB/FOB stool req 1U prbc's->EGD showed portal gastropathy, barrett's esoph, and chronic active h. pylori gastritis.  .Marland KitchenHistory of gout     2007 &  2008  LEFT LEG-- NO ISSUE SINCE  . Chronic low back pain   . OSA (obstructive sleep apnea)     PULMOLOGIST-  DR CLANCE--  MODERATE OSA  STARTED CPAP 2012--  BUT CURRENTLY HAS NOT USED PAST 6 MONTHS  . Hepatic cirrhosis     a. Dx 01/2014 - CT a/p   . Kidney stones   . Asthma   . History of blood transfusion     "related to bleeding ulcers"  . Arthritis      "joints tighten up sometimes" (03/27/2104)  . OA (osteoarthritis of spine)     LOWER BACK--  INTERMITTANT LEFT LEG NUMBNESS  . Chronic lower back pain    Past Surgical History  Procedure Laterality Date  . Esophagogastroduodenoscopy  08/15/2012    Procedure: ESOPHAGOGASTRODUODENOSCOPY (EGD);  Surgeon: SWonda Horner MD;  [facility];  Service: Endoscopy;  Laterality: N/A;  . Ankle fracture surgery Right 1989    "plate put in"  . Laparoscopic umbilical hernia repair w/ mesh  06-06-2011  . Open appendectomy w/ partial cecectomy  05-16-2004  . Dobutamine stress echo  06-08-2012    MODERATE HYPOKINESIS/ ISCHEMIA MID INFERIOR WALL  . Nephrolithotomy  1990'S  . Circumcision N/A 09/09/2013    Procedure: CIRCUMCISION ADULT;  Surgeon: DBernestine Amass MD;  [facility]  Service: Urology;  Laterality: N/A;  . Esophagogastroduodenoscopy N/A 02/17/2014    Procedure: ESOPHAGOGASTRODUODENOSCOPY (EGD);  Surgeon: MJeryl Columbia MD;  [facility];  Service: Endoscopy;  Laterality: N/A;  . Hernia repair    . Appendectomy  05-16-2004    open  . Colectomy  05-16-2004  . Coronary angioplasty with stent placement  06/28/2012  DR CBurt Knack  PCI W/  X1 DES to PROX. LAD/  LM  40% OSTIAL & 50-60% DISTAL /  50% PROX LCX/  30-40% PROX RCA & 50% MID RCA/   LVEF 65-70%  . Coronary artery bypass graft N/A 03/31/2014    Procedure: CORONARY ARTERY BYPASS GRAFTING (CABG) times 3 using left internal mammary artery and right saphenous vein.;  Surgeon: Melrose Nakayama, MD;  Location: [place];  Service: Open Heart Surgery;  Laterality: N/A;  . Intraoperative transesophageal echocardiogram N/A 03/31/2014    Procedure: INTRAOPERATIVE TRANSESOPHAGEAL ECHOCARDIOGRAM;  Surgeon: Melrose Nakayama, MD;  Location: [place];  Service: Open Heart Surgery;  Laterality: N/A;  . Percutaneous coronary stent intervention (pci-s) N/A 06/28/2012    Procedure: PERCUTANEOUS CORONARY STENT INTERVENTION  (PCI-S);  Surgeon: Sherren Mocha, MD;  Location[facility] CATH LAB;  Service: Cardiovascular;  Laterality: N/A;  . Left heart catheterization with coronary angiogram N/A 03/28/2014    Procedure: LEFT HEART CATHETERIZATION WITH CORONARY ANGIOGRAM;  Surgeon: Sinclair Grooms, MD;  Location: [facility] CATH LAB;  Service: Cardiovascular;  Laterality: N/A;   Family History  Problem Relation Age of Onset  . Coronary artery disease    . Diabetes    . Colon cancer    . Lung cancer Sister   . Cancer Sister     lung  . Cancer Brother     lung  . Cancer Sister     lung  . Cancer Father     died in his 29s.  . Cancer Mother    History  Substance Use Topics  . Smoking status: Former Smoker -- 2.00 packs/day for 40 years    Types: Cigarettes    Quit date: 08/29/1996  . Smokeless tobacco: Never Used  . Alcohol Use: Yes     Comment: 03/27/2014 "stopped frinking ~ 2 months ago; drank heavily before that"    Review of Systems  HENT: Positive for ear pain. Negative for ear discharge and hearing loss.   Eyes: Negative for visual disturbance.  Neurological: Negative for headaches.  All other systems reviewed and are negative.     Allergies  Review of patient's allergies indicates no known allergies.  Home Medications   Prior to Admission medications   Medication Sig Start Date End Date Taking? Authorizing Provider  aspirin EC 81 MG tablet Take 81 mg by mouth 2 (two) times daily.    Historical Provider, MD  atorvastatin (LIPITOR) 80 MG tablet TAKE ONE TABLET BY MOUTH ONCE DAILY 12/29/14   Rowe Clack, MD  ciprofloxacin-hydrocortisone (CIPRO [facility]) otic suspension Place 3 drops into the left ear 2 (two) times daily. 03/12/15   Comer Locket, PA-C  ferrous sulfate 325 (65 FE) MG tablet Take 325 mg by mouth 2 (two) times daily with a meal.     Historical Provider, MD  furosemide (LASIX) 40 MG tablet Take 1 tablet (40 mg total) by mouth daily. TAKE 1 TABLET 40 MG TWICE A DAY 05/14/14   Imogene Burn,  PA-C  losartan (COZAAR) 50 MG tablet  08/27/14   Historical Provider, MD  metFORMIN (GLUCOPHAGE) 500 MG tablet Take 500 mg by mouth 2 (two) times daily with a meal.    Historical Provider, MD  PRESCRIPTION MEDICATION Take 1 tablet by mouth 2 (two) times daily. Potassium-containing combination pill    Historical Provider, MD  ranitidine (ZANTAC) 150 MG capsule Take 150 mg by mouth 2 (two) times daily.    Historical Provider, MD  traMADol (ULTRAM) 50 MG tablet Take  1 tablet (50 mg total) by mouth every 6 (six) hours as needed. 11/03/14   Tatyana Kirichenko, PA-C   BP 118/64 mmHg  Pulse 92  Temp(Src) 98.1 F (36.7 C) (Oral)  Resp 16  SpO2 97% Physical Exam  Constitutional: He is oriented to person, place, and time. He appears well-developed and well-nourished. No distress.  HENT:  Head: Normocephalic and atraumatic.  Right Ear: Tympanic membrane, external ear and ear canal normal.  Left Ear: There is drainage. Tympanic membrane is erythematous.  Left internal auditory canal is mildly erythematous and inflamed with some purulent discharge. No mastoid tenderness  Eyes: EOM are normal.  Neck: Neck supple. No tracheal deviation present.  Cardiovascular: Normal rate.   Pulmonary/Chest: Effort normal. No respiratory distress.  Musculoskeletal: Normal range of motion.  Neurological: He is alert and oriented to person, place, and time.  Skin: Skin is warm and dry.  Psychiatric: He has a normal mood and affect. His behavior is normal.  Nursing note and vitals reviewed.   ED Course  Procedures (including critical care time) DIAGNOSTIC STUDIES: Oxygen Saturation is 96% on RA, nl by my interpretation.    COORDINATION OF CARE: 2:54 PM-Discussed treatment plan with pt at bedside and pt agreed to plan.   Labs Review Labs Reviewed - No data to display  Imaging Review No results found.   EKG Interpretation None     Meds given in ED:  Medications - No data to display  Discharge  Medication List as of 03/12/2015  2:58 PM    START taking these medications   Details  ciprofloxacin-hydrocortisone (CIPRO [facility]) otic suspension Place 3 drops into the left ear 2 (two) times daily., Starting 03/12/2015, Until Discontinued, Print       Filed Vitals:   03/12/15 1432 03/12/15 1506  BP: 125/57 118/64  Pulse: 80 92  Temp: 98.7 F (37.1 C) 98.1 F (36.7 C)  TempSrc: Oral Oral  Resp: 20 16  SpO2: 96% 97%    MDM  Vitals stable - WNL -afebrile Pt resting comfortably in ED. Exam consistent with otitis externa. Treated with Cipro hydrocortisone drops. Instructed to follow with primary care in 3 days to ensure symptoms improving. Patient verbalizes understanding and agrees to plan. I discussed all relevant lab findings and imaging results with pt and they verbalized understanding. Discussed f/u with PCP within 48 hrs and return precautions, pt very amenable to plan.  Final diagnoses:  Otitis externa, left    I personally performed the services described in this documentation, which was scribed in my presence. The recorded information has been reviewed and is accurate.    Comer Locket, PA-C 03/12/15 5681  Ernestina Patches, MD 03/14/15 1100

## 2015-03-12 NOTE — Discharge Instructions (Signed)
Take your ear drop medication as prescribed. Follow-up with primary care within 3 days to ensure healing. Return to ED for worsening symptoms.  Otitis Externa Otitis externa is a bacterial or fungal infection of the outer ear canal. This is the area from the eardrum to the outside of the ear. Otitis externa is sometimes called "swimmer's ear." CAUSES  Possible causes of infection include:  Swimming in dirty water.  Moisture remaining in the ear after swimming or bathing.  Mild injury (trauma) to the ear.  Objects stuck in the ear (foreign body).  Cuts or scrapes (abrasions) on the outside of the ear. SIGNS AND SYMPTOMS  The first symptom of infection is often itching in the ear canal. Later signs and symptoms may include swelling and redness of the ear canal, ear pain, and yellowish-white fluid (pus) coming from the ear. The ear pain may be worse when pulling on the earlobe. DIAGNOSIS  Your health care provider will perform a physical exam. A sample of fluid may be taken from the ear and examined for bacteria or fungi. TREATMENT  Antibiotic ear drops are often given for 10 to 14 days. Treatment may also include pain medicine or corticosteroids to reduce itching and swelling. HOME CARE INSTRUCTIONS   Apply antibiotic ear drops to the ear canal as prescribed by your health care provider.  Take medicines only as directed by your health care provider.  If you have diabetes, follow any additional treatment instructions from your health care provider.  Keep all follow-up visits as directed by your health care provider. PREVENTION   Keep your ear dry. Use the corner of a towel to absorb water out of the ear canal after swimming or bathing.  Avoid scratching or putting objects inside your ear. This can damage the ear canal or remove the protective wax that lines the canal. This makes it easier for bacteria and fungi to grow.  Avoid swimming in lakes, polluted water, or poorly chlorinated  pools.  You may use ear drops made of rubbing alcohol and vinegar after swimming. Combine equal parts of white vinegar and alcohol in a bottle. Put 3 or 4 drops into each ear after swimming. SEEK MEDICAL CARE IF:   You have a fever.  Your ear is still red, swollen, painful, or draining pus after 3 days.  Your redness, swelling, or pain gets worse.  You have a severe headache.  You have redness, swelling, pain, or tenderness in the area behind your ear. MAKE SURE YOU:   Understand these instructions.  Will watch your condition.  Will get help right away if you are not doing well or get worse. Document Released: 08/15/2005 Document Revised: 12/30/2013 Document Reviewed: 09/01/2011 [facility] Patient Information 2015 [place], [place]. This information is not intended to replace advice given to you by your health care provider. Make sure you discuss any questions you have with your health care provider.

## 2015-03-12 NOTE — ED Notes (Signed)
C/o left ear pain and fullness.

## 2015-03-14 ENCOUNTER — Other Ambulatory Visit: Payer: Self-pay | Admitting: Internal Medicine

## 2015-04-27 ENCOUNTER — Other Ambulatory Visit: Payer: Self-pay | Admitting: Internal Medicine

## 2015-04-28 ENCOUNTER — Other Ambulatory Visit: Payer: Self-pay | Admitting: Physician Assistant

## 2015-04-30 ENCOUNTER — Other Ambulatory Visit: Payer: Self-pay | Admitting: Physician Assistant

## 2015-05-05 ENCOUNTER — Telehealth: Payer: Self-pay | Admitting: *Deleted

## 2015-05-05 MED ORDER — FUROSEMIDE 40 MG PO TABS: 40.0000 mg | ORAL_TABLET | Freq: Two times a day (BID) | ORAL | Status: DC

## 2015-05-05 NOTE — Telephone Encounter (Signed)
Agree with note by Michelle Swinyer, RN  

## 2015-05-05 NOTE — Telephone Encounter (Signed)
Spoke with patient's wife, who is dpr.  She states she did not come with patient to last ov but that he his furosemide is 20 mg and he has been taking 30 mg twice daily.  I advised that Ermalinda Barrios, PA ordered furosemide 40 mg bid 1 year ago and Dr. Acie Fredrickson advised at last ov on 02/27/15 to continue current dosage.  I advised that I am sending refill for 40 mg bid.  I advised her to call back with questions or concerns and she verbalized understanding and agreement.

## 2015-05-05 NOTE — Telephone Encounter (Signed)
Patients wife called for a refill of furosemide for the patient. She stated that he gets a '20mg'$  tablet and takes one and one-half daily. Last office visit with Dr Acie Fredrickson has furosemide '40mg'$ , but with two different sigs. Please advise as to what current therapy should be. Thanks, MI

## 2015-06-09 ENCOUNTER — Encounter: Payer: Self-pay | Admitting: Cardiovascular Disease

## 2015-06-09 ENCOUNTER — Ambulatory Visit (INDEPENDENT_AMBULATORY_CARE_PROVIDER_SITE_OTHER): Payer: PPO | Admitting: Cardiovascular Disease

## 2015-06-09 VITALS — BP 118/78 | HR 72 | Ht 68.0 in | Wt 266.4 lb

## 2015-06-09 DIAGNOSIS — E785 Hyperlipidemia, unspecified: Secondary | ICD-10-CM | POA: Diagnosis not present

## 2015-06-09 MED ORDER — LOSARTAN POTASSIUM 50 MG PO TABS: 50.0000 mg | ORAL_TABLET | Freq: Every day | ORAL | Status: DC

## 2015-06-09 MED ORDER — ASPIRIN EC 81 MG PO TBEC: 81.0000 mg | DELAYED_RELEASE_TABLET | Freq: Two times a day (BID) | ORAL | Status: DC

## 2015-06-09 MED ORDER — ATORVASTATIN CALCIUM 80 MG PO TABS: 80.0000 mg | ORAL_TABLET | Freq: Every day | ORAL | Status: DC

## 2015-06-09 MED ORDER — FUROSEMIDE 40 MG PO TABS: 40.0000 mg | ORAL_TABLET | Freq: Two times a day (BID) | ORAL | Status: DC

## 2015-06-09 NOTE — Patient Instructions (Addendum)
Medication Instructions:  Your physician recommends that you continue on your current medications as directed. Please refer to the Current Medication list given to you today.   Labwork: TODAY - cholesterol, liver, basic metabolic panel   Testing/Procedures: None Ordered   Follow-Up: Your physician wants you to follow-up in: 6 months with Dr. Acie Fredrickson.  You will receive a reminder letter in the mail two months in advance. If you don't receive a letter, please call our office to schedule the follow-up appointment.

## 2015-06-09 NOTE — Progress Notes (Signed)
Cardiology Office Note   Date:  06/09/2015   ID:  SHEFFIELD HAWKER, DOB Jan 12, 1946, MRN [id number]  PCP:  Gwendolyn Grant, MD  Cardiologist:   Acie Fredrickson Wonda Cheng, MD   Chief Complaint  Patient presents with  . Coronary Artery Disease      History of Present Illness: [name] is a 69 y.o. male who presents for follow up of his CAD   Expand All Collapse All      ZEDRICK SPRINGSTEEN Date of BirthSeptember 22, 1947   [address] [address], [place], [place], [place], [place] [address] [phone]  Fax [phone]  Problem List: 1. CAD - 3.0 x 28 mm drug-eluting stent (06-28-12), s/p CABG  2. Indigestion 3. Anemia   History of Present Illness:  Daeron is a 69 yo with hx of indigestion / chest pain that develops with walking. The pain is a "ache" and lasts as long as he is walking. It causes him some dyspnea. It typically goes away when he stops. He has lots of belching. Hx of asthma. Hx of smoking in the past. No regular exercise. He is now retired. He gets a little exercise every day.  He had a dobutamine echo that revealed mild ischemia in the inferior wall.   He had a cardiac cath which revealed a tight irregular LAD stenosis. He had PTCA and stenting of his LAD by Dr. Burt Knack.  We have advised him to stop eating fried foods. He still is eating fried potatoes on a regular basis.   Jan 14, 2013:  Birchall has done well from a cardiac standpoint. He is having some problems with anemia. He was scheduled have a colonoscopy tomorrow but I did not want to stop the Effient and aspirin. The colonoscopy was canceled.  Nov. 17, 2014:  Aidenn is doing well. He is now a  year out from his stenting and can stop the Effient for his colonoscopy. He does some exercise. He has completely ignored my dietary suggestions from previous visits. Still eats everything that he wants to. Having some abdominal pains.   Oct. 28, 2015:  Christo is doing OK. He's had a hospital admission on July 31 and was found have severe left main disease as well as severe coronary disease. He had coronary artery bypass grafting on August 3. (CABG)X3 LIMA-LAD; SVG-OM; SVG-PD  Still has some chest soreness     Jan. 28, 2016:  Zhyon is doing well .  No CP . A bit or soreness from shoveling snow.  No angina while shoveling , just chest wall soreness.  The pain is on the left upper chest. Tender to palpitation. Not related to walking.  Has been present for 4-5 days ( since it snowed) not associated with dyspnea , sweats, or dizziness  No dyspnea.   Trying to stick to his diet. Not as successful during the holidays.   February 27, 2015: No angina. Has some chest wall tenderness - worse with coughing . Still leg edema   Oct. 11, 2016:  Had CABG August , 2015 Still having some MSK pain in his chest .  No angina   Staying pretty active.  Is retired.  Does lots of activities around the house and yard.    Past Medical History  Diagnosis Date  . Unspecified essential hypertension   . Coronary artery disease     a. 05/2012 Cath/PCI: LM 40ost, 50-60d, LAD 99p ruptured plaque (3.0x28 DES), LCX  50p/m, RCA 30-40p, 20m EF 65-70%.  . [place] 2 [place]   . GERD (gastroesophageal reflux disease)   . Phimosis     a. s/p circumcision 2015.  .Marland KitchenHyperlipidemia   . History of concussion     1976--  NO RESIDUAL  . Iron deficiency anemia   . History of GI bleed     a. UGIB 07/2012;  b. 01/2014 admission with GIB/FOB stool req 1U prbc's->EGD showed portal gastropathy, barrett's esoph, and chronic active h. pylori gastritis.  .Marland KitchenHistory of gout     2007 &  2008  LEFT LEG-- NO ISSUE  SINCE  . Chronic low back pain   . OSA (obstructive sleep apnea)     PULMOLOGIST-  DR CLANCE--  MODERATE OSA  STARTED CPAP 2012--  BUT CURRENTLY HAS NOT USED PAST 6 MONTHS  . Hepatic cirrhosis ([place]     a. Dx 01/2014 - CT a/p   . Kidney stones   . Asthma   . History of blood transfusion     "related to bleeding ulcers"  . Arthritis     "joints tighten up sometimes" (03/27/2104)  . OA (osteoarthritis of spine)     LOWER BACK--  INTERMITTANT LEFT LEG NUMBNESS  . Chronic lower back pain     Past Surgical History  Procedure Laterality Date  . Esophagogastroduodenoscopy  08/15/2012    Procedure: ESOPHAGOGASTRODUODENOSCOPY (EGD);  Surgeon: SWonda Horner MD;  [facility];  Service: Endoscopy;  Laterality: N/A;  . Ankle fracture surgery Right 1989    "plate put in"  . Laparoscopic umbilical hernia repair w/ mesh  06-06-2011  . Open appendectomy w/ partial cecectomy  05-16-2004  . Dobutamine stress echo  06-08-2012    MODERATE HYPOKINESIS/ ISCHEMIA MID INFERIOR WALL  . Nephrolithotomy  1990'S  . Circumcision N/A 09/09/2013    Procedure: CIRCUMCISION ADULT;  Surgeon: DBernestine Amass MD;  [facility]  Service: Urology;  Laterality: N/A;  . Esophagogastroduodenoscopy N/A 02/17/2014    Procedure: ESOPHAGOGASTRODUODENOSCOPY (EGD);  Surgeon: MJeryl Columbia MD;  [facility];  Service: Endoscopy;  Laterality: N/A;  . Hernia repair    . Appendectomy  05-16-2004    open  . Colectomy  05-16-2004  . Coronary angioplasty with stent placement  06/28/2012  DR COOPER    PCI W/  X1 DES to PUnion LAD/  LM  40% OSTIAL & 50-60% DISTAL /  50% PROX LCX/  30-40% PROX RCA & 50% MID RCA/   LVEF 65-70%  . Coronary artery bypass graft N/A 03/31/2014    Procedure: CORONARY ARTERY BYPASS GRAFTING (CABG) times 3 using left internal mammary artery and right saphenous vein.;  Surgeon: SMelrose Nakayama MD;  [facility]  Service: Open Heart Surgery;  Laterality: N/A;    . Intraoperative transesophageal echocardiogram N/A 03/31/2014    Procedure: INTRAOPERATIVE TRANSESOPHAGEAL ECHOCARDIOGRAM;  Surgeon: SMelrose Nakayama MD;  [facility]  Service: Open Heart Surgery;  Laterality: N/A;  . Percutaneous coronary stent intervention (pci-s) N/A 06/28/2012    Procedure: PERCUTANEOUS CORONARY STENT INTERVENTION (PCI-S);  Surgeon: MSherren Mocha MD;  [facility];  Service: Cardiovascular;  Laterality: N/A;  . Left heart catheterization with coronary angiogram N/A 03/28/2014    Procedure: LEFT HEART CATHETERIZATION WITH CORONARY ANGIOGRAM;  Surgeon: HSinclair Grooms MD;  [facility];  Service: Cardiovascular;  Laterality: N/A;     Current Outpatient Prescriptions  Medication Sig Dispense Refill  .  aspirin EC 81 MG tablet Take 81 mg by mouth 2 (two) times daily.    Marland Kitchen atorvastatin (LIPITOR) 80 MG tablet TAKE ONE TABLET BY MOUTH ONCE DAILY 30 tablet 2  . ferrous sulfate 325 (65 FE) MG tablet Take 325 mg by mouth 2 (two) times daily with a meal.     . furosemide (LASIX) 40 MG tablet Take 1 tablet (40 mg total) by mouth 2 (two) times daily. 180 tablet 3  . losartan (COZAAR) 50 MG tablet Take 1 tablet (50 mg total) by mouth daily. 90 tablet 3  . metFORMIN (GLUCOPHAGE) 500 MG tablet Take 500 mg by mouth 2 (two) times daily with a meal.    . PRESCRIPTION MEDICATION Take 1 tablet by mouth 2 (two) times daily. Potassium-containing combination pill    . ranitidine (ZANTAC) 150 MG capsule Take 150 mg by mouth 2 (two) times daily.    . traMADol (ULTRAM) 50 MG tablet Take 1 tablet (50 mg total) by mouth every 6 (six) hours as needed. (Patient taking differently: Take 50 mg by mouth every 6 (six) hours as needed for moderate pain. ) 20 tablet 0   No current facility-administered medications for this visit.    Allergies:   Review of patient's allergies indicates no known allergies.    Social History:  The patient  reports that he quit smoking about 18  years ago. His smoking use included Cigarettes. He has a 80 pack-year smoking history. He has never used smokeless tobacco. He reports that he drinks alcohol. He reports that he does not use illicit drugs.   Family History:  The patient's family history includes Cancer in his brother, father, mother, sister, and sister; Colon cancer in an other family member; Coronary artery disease in an other family member; Diabetes in an other family member; Lung cancer in his sister.    ROS:  Please see the history of present illness.    Review of Systems: Constitutional:  denies fever, chills, diaphoresis, appetite change and fatigue.  HEENT: denies photophobia, eye pain, redness, hearing loss, ear pain, congestion, sore throat, rhinorrhea, sneezing, neck pain, neck stiffness and tinnitus.  Respiratory: denies SOB, DOE, cough, chest tightness, and wheezing.  Cardiovascular: admits to chest pain,    Gastrointestinal: denies nausea, vomiting, abdominal pain, diarrhea, constipation, blood in stool.  Genitourinary: denies dysuria, urgency, frequency, hematuria, flank pain and difficulty urinating.  Musculoskeletal: denies  myalgias, back pain, joint swelling, arthralgias and gait problem.   Skin: denies pallor, rash and wound.  Neurological: denies dizziness, seizures, syncope, weakness, light-headedness, numbness and headaches.   Hematological: denies adenopathy, easy bruising, personal or family bleeding history.  Psychiatric/ Behavioral: denies suicidal ideation, mood changes, confusion, nervousness, sleep disturbance and agitation.       All other systems are reviewed and negative.    PHYSICAL EXAM: VS:  BP 118/78 mmHg  Pulse 72  Ht '5\' 8"'$  (1.727 m)  Wt 120.838 kg (266 lb 6.4 oz)  BMI 40.52 kg/m2  SpO2 97% , BMI Body mass index is 40.52 kg/(m^2). GEN: Well nourished, well developed, in no acute distress HEENT: normal Neck: no JVD, carotid bruits, or masses Cardiac: RRR; no murmurs, rubs, or  gallops,  1-2 + leg edema ,  He has left sided chest wall tenderness  Respiratory:  clear to auscultation bilaterally, normal work of breathing GI: soft, nontender, nondistended, + BS MS: no deformity or atrophy Skin: warm and dry, no rash Neuro:  Strength and sensation are intact Psych: normal  EKG:  EKG is ordered today. ECG shows NSR at 65.  , sinus arrhythmia, inc. RBBB.     Recent Labs: 06/26/2014: ALT 14; BUN 17; Creatinine, Ser 0.8; Potassium 4.0; Sodium 135    Lipid Panel    Component Value Date/Time   CHOL 87 06/26/2014 0859   TRIG 75.0 06/26/2014 0859   HDL 32.90* 06/26/2014 0859   CHOLHDL 3 06/26/2014 0859   VLDL 15.0 06/26/2014 0859   LDLCALC 39 06/26/2014 0859      Wt Readings from Last 3 Encounters:  06/09/15 120.838 kg (266 lb 6.4 oz)  02/27/15 123.433 kg (272 lb 1.9 oz)  01/20/15 121.473 kg (267 lb 12.8 oz)     Other studies Reviewed: Additional studies/ records that were reviewed today include: . Review of the above records demonstrates:    ASSESSMENT AND PLAN:  1.  CAD : Reford is doing well. He's not had any episodes of angina. He does have some chest soreness after shoveling snow for the past several days. I think that his chest pain is truly due to chest wall pain and does not represent angina. He's not had any episodes of pain while walking. In addition, the pain is not associated with shortness of breath.  He will continue with his same medications. I'll see him again in 6 months.    2. Essential hypertension:  Blood pressure is well-controlled. Continue current medications  3. Diabetes mellitus: Followed by his general medical doctor  4. Obesity:  I've encouraged him to continue to work on her diet and exercise program.  5. Hyperlipidemia: Continue current dose of atorvastatin, check lipids today   Current medicines are reviewed at length with the patient today.  The patient does not have concerns regarding medicines.  The following  changes have been made:  no change  Labs/ tests ordered today include:  No orders of the defined types were placed in this encounter.    Disposition:   FU with me in 6 months for OV , fasting labs and ECG     Signed, Sheridan Gettel, Wonda Cheng, MD  06/09/2015 3:57 PM    [address], [place], [place]  [address] Phone: [phone]; Fax: [phone]

## 2015-06-10 ENCOUNTER — Telehealth: Payer: Self-pay | Admitting: Nurse Practitioner

## 2015-06-10 LAB — LIPID PANEL
CHOL/HDL RATIO: 3.3 ratio (ref <=5.0)
CHOLESTEROL: 92 mg/dL — AB (ref 125–200)
HDL: 28 mg/dL — AB (ref >=40)
LDL Cholesterol: 40 mg/dL (ref <130)
TRIGLYCERIDES: 119 mg/dL (ref <150)
VLDL: 24 mg/dL (ref <30)

## 2015-06-10 LAB — HEPATIC FUNCTION PANEL
ALK PHOS: 85 U/L (ref 40–115)
ALT: 11 U/L (ref 9–46)
AST: 24 U/L (ref 10–35)
Albumin: 3.8 g/dL (ref 3.6–5.1)
Bilirubin, Direct: 0.1 mg/dL (ref <=0.2)
Indirect Bilirubin: 0.2 mg/dL (ref 0.2–1.2)
TOTAL PROTEIN: 7.1 g/dL (ref 6.1–8.1)
Total Bilirubin: 0.3 mg/dL (ref 0.2–1.2)

## 2015-06-10 LAB — BASIC METABOLIC PANEL
BUN: 17 mg/dL (ref 7–25)
CALCIUM: 9 mg/dL (ref 8.6–10.3)
CO2: 21 mmol/L (ref 20–31)
CREATININE: 0.75 mg/dL (ref 0.70–1.25)
Chloride: 103 mmol/L (ref 98–110)
GLUCOSE: 130 mg/dL — AB (ref 65–99)
Potassium: 4.3 mmol/L (ref 3.5–5.3)
SODIUM: 137 mmol/L (ref 135–146)

## 2015-06-10 MED ORDER — ATORVASTATIN CALCIUM 40 MG PO TABS: 40.0000 mg | ORAL_TABLET | Freq: Every day | ORAL | Status: DC

## 2015-06-10 NOTE — Telephone Encounter (Signed)
-----   Message from Thayer Headings, MD sent at 06/10/2015  8:28 AM EDT ----- HDL remains low. Have him increase exercise.  Glucose is elevated.  He should talk with his medical doctor about that. Otherwise labs are ok Cholesterol levels are very low

## 2015-06-10 NOTE — Telephone Encounter (Signed)
Per Dr. Acie Fredrickson patient should reduce Atorvastatin to 40 mg once daily.  I reviewed with patient and his wife who verbalized understanding.  He states he just picked up a Rx for Atorvastatin 80 mg.  I advised him to take 1/2 tab until he picks up future refills which will be for the 40 mg tabs.  Patient and his wife verbalized understanding and agreement.

## 2015-09-04 ENCOUNTER — Ambulatory Visit: Payer: PPO | Admitting: Internal Medicine

## 2015-09-15 ENCOUNTER — Encounter: Payer: Self-pay | Admitting: Internal Medicine

## 2015-09-15 ENCOUNTER — Other Ambulatory Visit (INDEPENDENT_AMBULATORY_CARE_PROVIDER_SITE_OTHER): Payer: PPO

## 2015-09-15 ENCOUNTER — Ambulatory Visit (INDEPENDENT_AMBULATORY_CARE_PROVIDER_SITE_OTHER): Payer: PPO | Admitting: Internal Medicine

## 2015-09-15 VITALS — BP 106/60 | HR 87 | Temp 98.1°F | Resp 16 | Ht 67.0 in | Wt 274.0 lb

## 2015-09-15 DIAGNOSIS — E118 Type 2 diabetes mellitus with unspecified complications: Secondary | ICD-10-CM

## 2015-09-15 DIAGNOSIS — D229 Melanocytic nevi, unspecified: Secondary | ICD-10-CM

## 2015-09-15 DIAGNOSIS — E785 Hyperlipidemia, unspecified: Secondary | ICD-10-CM

## 2015-09-15 DIAGNOSIS — I110 Hypertensive heart disease with heart failure: Secondary | ICD-10-CM

## 2015-09-15 LAB — VITAMIN B12: VITAMIN B 12: 293 pg/mL (ref 211–911)

## 2015-09-15 LAB — HEMOGLOBIN A1C: Hgb A1c MFr Bld: 6.7 % — ABNORMAL HIGH (ref 4.6–6.5)

## 2015-09-15 LAB — FOLATE: FOLATE: 23.3 ng/mL (ref >5.9)

## 2015-09-15 LAB — TSH: TSH: 1.86 u[IU]/mL (ref 0.35–4.50)

## 2015-09-15 NOTE — Assessment & Plan Note (Signed)
He is taking metformin and losartan. Last HgA1c 5.6 some time ago, if his still that controlled will decrease the metformin. However he has gained 20 pounds since that time. No eye exam in some time and reminded to get that. Foot exam done. Checking labs and adjust as needed.

## 2015-09-15 NOTE — Patient Instructions (Signed)
We will check some labs today and call you back with the results.   We will have you see the skin doctor to check on the moles.   You do need to see the eye doctor to make sure there are no signs of diabetes in the eyes. They put special drops in the eyes to dilate them and look in the back of the eye.  Work on exercising since you are up about 20 pounds since the last time you saw Dr. Asa Lente. Work on finding some exercise or activity that you can do. Water aerobics is a good option if you can do silver sneakers as it is good for the joints.   Diabetes and Standards of Medical Care Diabetes is complicated. You may find that your diabetes team includes a dietitian, nurse, diabetes educator, eye doctor, and more. To help everyone know what is going on and to help you get the care you deserve, the following schedule of care was developed to help keep you on track. Below are the tests, exams, vaccines, medicines, education, and plans you will need. HbA1c test This test shows how well you have controlled your glucose over the past 2-3 months. It is used to see if your diabetes management plan needs to be adjusted.   It is performed at least 2 times a year if you are meeting treatment goals.  It is performed 4 times a year if therapy has changed or if you are not meeting treatment goals. Blood pressure test  This test is performed at every routine medical visit. The goal is less than 140/90 mm Hg for most people, but 130/80 mm Hg in some cases. Ask your health care provider about your goal. Dental exam  Follow up with the dentist regularly. Eye exam  If you are diagnosed with type 1 diabetes as a child, get an exam upon reaching the age of 55 years or older and having had diabetes for 3-5 years. Yearly eye exams are recommended after that initial eye exam.  If you are diagnosed with type 1 diabetes as an adult, get an exam within 5 years of diagnosis and then yearly.  If you are diagnosed  with type 2 diabetes, get an exam as soon as possible after the diagnosis and then yearly. Foot care exam  Visual foot exams are performed at every routine medical visit. The exams check for cuts, injuries, or other problems with the feet.  You should have a complete foot exam performed every year. This exam includes an inspection of the structure and skin of your feet, a check of the pulses in your feet, and a check of the sensation in your feet.  Type 1 diabetes: The first exam is performed 5 years after diagnosis.  Type 2 diabetes: The first exam is performed at the time of diagnosis.  Check your feet nightly for cuts, injuries, or other problems with your feet. Tell your health care provider if anything is not healing. Kidney function test (urine microalbumin)  This test is performed once a year.  Type 1 diabetes: The first test is performed 5 years after diagnosis.  Type 2 diabetes: The first test is performed at the time of diagnosis.  A serum creatinine and estimated glomerular filtration rate (eGFR) test is done once a year to assess the level of chronic kidney disease (CKD), if present. Lipid profile (cholesterol, HDL, LDL, triglycerides)  Performed every 5 years for most people.  The goal for LDL is less than  100 mg/dL. If you are at high risk, the goal is less than 70 mg/dL.  The goal for HDL is 40 mg/dL-50 mg/dL for men and 50 mg/dL-60 mg/dL for women. An HDL cholesterol of 60 mg/dL or higher gives some protection against heart disease.  The goal for triglycerides is less than 150 mg/dL. Immunizations  The flu (influenza) vaccine is recommended yearly for every person 47 months of age or older who has diabetes.  The pneumonia (pneumococcal) vaccine is recommended for every person 32 years of age or older who has diabetes. Adults 13 years of age or older may receive the pneumonia vaccine as a series of two separate shots.  The hepatitis B vaccine is recommended for adults  shortly after they have been diagnosed with diabetes.  The Tdap (tetanus, diphtheria, and pertussis) vaccine should be given:  According to normal childhood vaccination schedules, for children.  Every 10 years, for adults who have diabetes. Diabetes self-management education  Education is recommended at diagnosis and ongoing as needed. Treatment plan  Your treatment plan is reviewed at every medical visit.   This information is not intended to replace advice given to you by your health care provider. Make sure you discuss any questions you have with your health care provider.   Document Released: 06/12/2009 Document Revised: 09/05/2014 Document Reviewed: 01/15/2013 Elsevier Interactive Patient Education [employer].

## 2015-09-15 NOTE — Assessment & Plan Note (Signed)
BP at goal today with lasix and losartan. Reviewed recent labs and no indication for change.

## 2015-09-15 NOTE — Progress Notes (Signed)
   Subjective:    Patient ID: [name], male    DOB: 09-28-1945, 70 y.o.   MRN: [id number]  HPI The patient is a 70 YO man coming in for follow up of his medical problems. He is following up on his diabetes (controlled with complications of neuropathy, taking metformin and losartan), his blood pressure (controlled on losartan and lasix, not known to be complicated), and new problem of moles (has lots of moles and several are changing in the last year, had a dermatologist but they retired and he has not seen anyone in a long time). No other concerns. Numbness in his feet.  PMH, [facility], social history reviewed and updated at visit.   Review of Systems  Constitutional: Positive for activity change and fatigue. Negative for fever, appetite change and unexpected weight change.  HENT: Negative.   Eyes: Negative.   Respiratory: Positive for shortness of breath. Negative for cough, chest tightness and wheezing.   Cardiovascular: Positive for chest pain and leg swelling. Negative for palpitations.  Gastrointestinal: Positive for abdominal distention. Negative for nausea, abdominal pain, diarrhea and constipation.  Musculoskeletal: Negative.   Skin: Positive for color change.  Neurological: Positive for numbness. Negative for dizziness, weakness, light-headedness and headaches.  Psychiatric/Behavioral: Negative.       Objective:   Physical Exam  Constitutional: He is oriented to person, place, and time. He appears well-developed and well-nourished.  Overweight and unkempt  HENT:  Head: Normocephalic and atraumatic.  Eyes: EOM are normal.  Neck: Normal range of motion. No JVD present.  Cardiovascular: Normal rate and regular rhythm.   Pulmonary/Chest: Effort normal. No respiratory distress. He has no wheezes. He has no rales.  Abdominal: Soft. Bowel sounds are normal. He exhibits distension. There is no tenderness. There is no rebound and no guarding.  Adiposity versus distention    Musculoskeletal: He exhibits edema.  2+ edema to the knees bilaterally and color change consistent with venous stasis  Neurological: He is alert and oriented to person, place, and time. Coordination normal.  Skin: Skin is warm and dry.  Numerous moles some of which are skin tags and others that look concerning and need removal   Filed Vitals:   09/15/15 0956  BP: 106/60  Pulse: 87  Temp: 98.1 F (36.7 C)  TempSrc: Oral  Resp: 16  Height: '5\' 7"'$  (1.702 m)  Weight: 274 lb (124.286 kg)  SpO2: 96%      Assessment & Plan:

## 2015-09-15 NOTE — Assessment & Plan Note (Signed)
Recent lipid panel okay, no side effects.

## 2015-09-15 NOTE — Assessment & Plan Note (Signed)
Referral to dermatology for the moles. Some appear to need removal and others are harmless skin tags. Not able to visualize them all today due to outfit but he will show dermatology.

## 2015-09-15 NOTE — Progress Notes (Signed)
Pre visit review using our clinic review tool, if applicable. No additional management support is needed unless otherwise documented below in the visit note. 

## 2015-09-27 DIAGNOSIS — G4733 Obstructive sleep apnea (adult) (pediatric): Secondary | ICD-10-CM | POA: Diagnosis not present

## 2015-09-28 ENCOUNTER — Telehealth: Payer: Self-pay | Admitting: Internal Medicine

## 2015-09-28 MED ORDER — GABAPENTIN 100 MG PO CAPS: 100.0000 mg | ORAL_CAPSULE | Freq: Two times a day (BID) | ORAL | Status: DC

## 2015-09-28 NOTE — Telephone Encounter (Signed)
Sent in gabapentin for the feet. Can take daily for the first 2 days then increase to twice daily and let us know if not working we can adjust.

## 2015-09-28 NOTE — Telephone Encounter (Signed)
States Dr. Sharlet Salina was suppose to send something to patients pharmacy in regards to his feet burning.  Please follow up.

## 2015-09-28 NOTE — Telephone Encounter (Signed)
Forwarded to PCP for advisement.

## 2015-09-29 NOTE — Telephone Encounter (Signed)
Pts wife informed.

## 2015-09-29 NOTE — Telephone Encounter (Signed)
Tried to call number for pt. Number does not have a voicemail set up.

## 2015-10-22 DIAGNOSIS — D1801 Hemangioma of skin and subcutaneous tissue: Secondary | ICD-10-CM | POA: Diagnosis not present

## 2015-10-22 DIAGNOSIS — B079 Viral wart, unspecified: Secondary | ICD-10-CM | POA: Diagnosis not present

## 2015-10-22 DIAGNOSIS — D485 Neoplasm of uncertain behavior of skin: Secondary | ICD-10-CM | POA: Diagnosis not present

## 2015-10-27 DIAGNOSIS — D1801 Hemangioma of skin and subcutaneous tissue: Secondary | ICD-10-CM | POA: Diagnosis not present

## 2015-10-27 DIAGNOSIS — B078 Other viral warts: Secondary | ICD-10-CM | POA: Diagnosis not present

## 2015-10-27 DIAGNOSIS — G4733 Obstructive sleep apnea (adult) (pediatric): Secondary | ICD-10-CM | POA: Diagnosis not present

## 2015-10-28 DIAGNOSIS — E119 Type 2 diabetes mellitus without complications: Secondary | ICD-10-CM | POA: Diagnosis not present

## 2015-11-05 ENCOUNTER — Telehealth: Payer: Self-pay | Admitting: Internal Medicine

## 2015-11-05 NOTE — Telephone Encounter (Signed)
Pt son called in looking for results test he had done.  Can you call him when you get a chance??

## 2015-11-06 NOTE — Telephone Encounter (Signed)
I spoke with patient. He was asking about the results of the moles he had removed. He already got the results by the office that performed the procedure.

## 2015-11-12 ENCOUNTER — Ambulatory Visit (INDEPENDENT_AMBULATORY_CARE_PROVIDER_SITE_OTHER): Payer: PPO | Admitting: Internal Medicine

## 2015-11-12 ENCOUNTER — Encounter: Payer: Self-pay | Admitting: Internal Medicine

## 2015-11-12 VITALS — BP 130/82 | HR 96 | Temp 98.2°F | Resp 18 | Ht 68.0 in | Wt 275.0 lb

## 2015-11-12 DIAGNOSIS — R609 Edema, unspecified: Secondary | ICD-10-CM | POA: Diagnosis not present

## 2015-11-12 DIAGNOSIS — M7989 Other specified soft tissue disorders: Secondary | ICD-10-CM | POA: Diagnosis not present

## 2015-11-12 NOTE — Progress Notes (Signed)
Pre visit review using our clinic review tool, if applicable. No additional management support is needed unless otherwise documented below in the visit note. 

## 2015-11-12 NOTE — Progress Notes (Signed)
   Subjective:    Patient ID: [name], male    DOB: 1946-01-07, 70 y.o.   MRN: [id number]  HPI The patient is a 70 YO man coming in for some swelling in his right leg and ankle. Weight is stable from last visit. Has been going on for about 1-2 months, since his mole biopsy. Denies immobilization or long travel. He had prior injury to that leg with surgery in the past. Left leg is not swelling. Takes fluid pill BID lasix which helps some. He is able to elevate the leg to decrease the swelling. No calf tenderness but he will get some pain in the leg occasionally. No skin color change. Biopsy site is still healing.   Review of Systems  Constitutional: Negative for fever, appetite change and unexpected weight change.  Respiratory: Negative for cough, chest tightness and wheezing.   Cardiovascular: Positive for leg swelling. Negative for palpitations.  Gastrointestinal: Positive for abdominal distention. Negative for nausea, abdominal pain, diarrhea and constipation.  Musculoskeletal: Positive for myalgias. Negative for back pain, arthralgias and gait problem.  Skin: Positive for color change and wound. Negative for pallor and rash.  Neurological: Positive for numbness. Negative for dizziness, weakness, light-headedness and headaches.  Psychiatric/Behavioral: Negative.       Objective:   Physical Exam  Constitutional: He is oriented to person, place, and time. He appears well-developed and well-nourished.  Overweight and unkempt  HENT:  Head: Normocephalic and atraumatic.  Eyes: EOM are normal.  Neck: Normal range of motion. No JVD present.  Cardiovascular: Normal rate and regular rhythm.   Pulmonary/Chest: Effort normal. No respiratory distress. He has no wheezes. He has no rales.  Abdominal: Soft. Bowel sounds are normal. He exhibits distension. There is no tenderness. There is no rebound and no guarding.  Adiposity versus distention  Musculoskeletal: He exhibits edema.  2+ edema to  the knees right leg, left 1+ edema  Neurological: He is alert and oriented to person, place, and time. Coordination normal.  Skin: Skin is warm and dry.  Right leg more swollen that the left and with healing biopsy site right ankle, no redness of the skin or calf tenderness.    Filed Vitals:   11/12/15 0833  BP: 130/82  Pulse: 96  Temp: 98.2 F (36.8 C)  TempSrc: Oral  Resp: 18  Height: '5\' 8"'$  (1.727 m)  Weight: 275 lb (124.739 kg)  SpO2: 96%      Assessment & Plan:

## 2015-11-12 NOTE — Patient Instructions (Signed)
We want you to start wearing the compression stocking on the right leg.   We are going to check an ultrasound of the right leg to make sure you have no blood clot there.   Venous Stasis or Chronic Venous Insufficiency Chronic venous insufficiency, also called venous stasis, is a condition that affects the veins in the legs. The condition prevents blood from being pumped through these veins effectively. Blood may no longer be pumped effectively from the legs back to the heart. This condition can range from mild to severe. With proper treatment, you should be able to continue with an active life. CAUSES  Chronic venous insufficiency occurs when the vein walls become stretched, weakened, or damaged or when valves within the vein are damaged. Some common causes of this include:  High blood pressure inside the veins (venous hypertension).  Increased blood pressure in the leg veins from long periods of sitting or standing.  A blood clot that blocks blood flow in a vein (deep vein thrombosis).  Inflammation of a superficial vein (phlebitis) that causes a blood clot to form. RISK FACTORS Various things can make you more likely to develop chronic venous insufficiency, including:  Family history of this condition.  Obesity.  Pregnancy.  Sedentary lifestyle.  Smoking.  Jobs requiring long periods of standing or sitting in one place.  Being a certain age. Women in their 76s and 57s and men in their 32s are more likely to develop this condition. SIGNS AND SYMPTOMS  Symptoms may include:   Varicose veins.  Skin breakdown or ulcers.  Reddened or discolored skin on the leg.  Brown, smooth, tight, and painful skin just above the ankle, usually on the inside surface (lipodermatosclerosis).  Swelling. DIAGNOSIS  To diagnose this condition, your health care provider will take a medical history and do a physical exam. The following tests may be ordered to confirm the diagnosis:  Duplex  ultrasound--A procedure that produces a picture of a blood vessel and nearby organs and also provides information on blood flow through the blood vessel.  Plethysmography--A procedure that tests blood flow.  A venogram, or venography--A procedure used to look at the veins using X-ray and dye. TREATMENT The goals of treatment are to help you return to an active life and to minimize pain or disability. Treatment will depend on the severity of the condition. Medical procedures may be needed for severe cases. Treatment options may include:   Use of compression stockings. These can help with symptoms and lower the chances of the problem getting worse, but they do not cure the problem.  Sclerotherapy--A procedure involving an injection of a material that "dissolves" the damaged veins. Other veins in the network of blood vessels take over the function of the damaged veins.  Surgery to remove the vein or cut off blood flow through the vein (vein stripping or laser ablation surgery).  Surgery to repair a valve. HOME CARE INSTRUCTIONS   Wear compression stockings as directed by your health care provider.  Only take over-the-counter or prescription medicines for pain, discomfort, or fever as directed by your health care provider.  Follow up with your health care provider as directed. SEEK MEDICAL CARE IF:   You have redness, swelling, or increasing pain in the affected area.  You see a red streak or line that extends up or down from the affected area.  You have a breakdown or loss of skin in the affected area, even if the breakdown is small.  You have an  injury to the affected area. SEEK IMMEDIATE MEDICAL CARE IF:   You have an injury and open wound in the affected area.  Your pain is severe and does not improve with medicine.  You have sudden numbness or weakness in the foot or ankle below the affected area, or you have trouble moving your foot or ankle.  You have a fever or persistent  symptoms for more than 2-3 days.  You have a fever and your symptoms suddenly get worse. MAKE SURE YOU:   Understand these instructions.  Will watch your condition.  Will get help right away if you are not doing well or get worse.   This information is not intended to replace advice given to you by your health care provider. Make sure you discuss any questions you have with your health care provider.   Document Released: 12/19/2006 Document Revised: 06/05/2013 Document Reviewed: 04/22/2013 Elsevier Interactive Patient Education [employer].

## 2015-11-12 NOTE — Assessment & Plan Note (Signed)
Taking lasix 40 mg BID and left leg normal, right leg increased swelling. Ordered LE venous for right leg. No recent travel or immobilization. Does have some heart and liver cause for fluid but no exacerbation. Likely some venous insufficiency given the skin changes from chronic edema. Rx for compression stockings.

## 2015-11-17 ENCOUNTER — Ambulatory Visit (HOSPITAL_COMMUNITY)
Admission: RE | Admit: 2015-11-17 | Discharge: 2015-11-17 | Disposition: A | Discharge status: Home / Self Care | Payer: PPO | Source: Ambulatory Visit | Attending: Internal Medicine | Admitting: Internal Medicine

## 2015-11-17 DIAGNOSIS — K219 Gastro-esophageal reflux disease without esophagitis: Secondary | ICD-10-CM | POA: Diagnosis not present

## 2015-11-17 DIAGNOSIS — I10 Essential (primary) hypertension: Secondary | ICD-10-CM | POA: Insufficient documentation

## 2015-11-17 DIAGNOSIS — I2510 Atherosclerotic heart disease of native coronary artery without angina pectoris: Secondary | ICD-10-CM | POA: Insufficient documentation

## 2015-11-17 DIAGNOSIS — G4733 Obstructive sleep apnea (adult) (pediatric): Secondary | ICD-10-CM | POA: Insufficient documentation

## 2015-11-17 DIAGNOSIS — E119 Type 2 diabetes mellitus without complications: Secondary | ICD-10-CM | POA: Diagnosis not present

## 2015-11-17 DIAGNOSIS — E785 Hyperlipidemia, unspecified: Secondary | ICD-10-CM | POA: Diagnosis not present

## 2015-11-17 DIAGNOSIS — M7989 Other specified soft tissue disorders: Secondary | ICD-10-CM | POA: Insufficient documentation

## 2015-12-08 ENCOUNTER — Encounter: Payer: Self-pay | Admitting: Cardiovascular Disease

## 2015-12-08 ENCOUNTER — Ambulatory Visit (INDEPENDENT_AMBULATORY_CARE_PROVIDER_SITE_OTHER): Payer: PPO | Admitting: Cardiovascular Disease

## 2015-12-08 VITALS — BP 130/68 | HR 84 | Ht 68.0 in | Wt 269.1 lb

## 2015-12-08 DIAGNOSIS — E785 Hyperlipidemia, unspecified: Secondary | ICD-10-CM

## 2015-12-08 DIAGNOSIS — I25810 Atherosclerosis of coronary artery bypass graft(s) without angina pectoris: Secondary | ICD-10-CM

## 2015-12-08 NOTE — Progress Notes (Addendum)
Cardiology Office Note   Date:  12/08/2015   ID:  [name], DOB [birth year], MRN [id number]  PCP:  [name], MD  Cardiologist:   Acie Fredrickson Wonda Cheng, MD   Chief Complaint  Patient presents with  . Follow-up      History of Present Illness: [name] is a 70 y.o. male who presents for follow up of his CAD   Expand All Collapse All      [name] Date of Birth[birth year]   [place] [address], [place], [place], [place], [place] [address] [phone]  Fax [phone]  Problem List: 1. CAD - 3.0 x 28 mm drug-eluting stent (06-28-12), s/p CABG  2. Indigestion 3. Anemia   History of Present Illness:  [name] is a 70 yo with hx of indigestion / chest pain that develops with walking. The pain is a "ache" and lasts as long as he is walking. It causes him some dyspnea. It typically goes away when he stops. He has lots of belching. Hx of asthma. Hx of smoking in the past. No regular exercise. He is now retired. He gets a little exercise every day.  He had a dobutamine echo that revealed mild ischemia in the inferior wall.   He had a cardiac cath which revealed a tight irregular LAD stenosis. He had PTCA and stenting of his LAD by Dr. Burt Knack.  We have advised him to stop eating fried foods. He still is eating fried potatoes on a regular basis.   Jan 14, 2013:  Fife has done well from a cardiac standpoint. He is having some problems with anemia. He was scheduled have a colonoscopy tomorrow but I did not want to stop the Effient and aspirin. The colonoscopy was canceled.  Nov. 17, 2014:  [name] is doing well. He is now a year out from  his stenting and can stop the Effient for his colonoscopy. He does some exercise. He has completely ignored my dietary suggestions from previous visits. Still eats everything that he wants to. Having some abdominal pains.   Oct. 28, 2015:  [name] is doing OK. He's had a hospital admission on July 31 and was found have severe left main disease as well as severe coronary disease. He had coronary artery bypass grafting on August 3. (CABG)X3 LIMA-LAD; SVG-OM; SVG-PD  Still has some chest soreness     Jan. 28, 2016:  [name] is doing well .  No CP . A bit or soreness from shoveling snow.  No angina while shoveling , just chest wall soreness.  The pain is on the left upper chest. Tender to palpitation. Not related to walking.  Has been present for 4-5 days ( since it snowed) not associated with dyspnea , sweats, or dizziness  No dyspnea.   Trying to stick to his diet. Not as successful during the holidays.   February 27, 2015: No angina. Has some chest wall tenderness - worse with coughing . Still leg edema   Oct. 11, 2016:  Had CABG August , 2015 Still having some MSK pain in his chest .  No angina   Staying pretty active.  Is retired.  Does lots of activities around the house and yard.   December 08, 2015:  [name] was seen today for follow up .  Hx of CABG BP and HR are well controlled.  Able to do all of his normal activities  Past Medical History  Diagnosis Date  . Unspecified essential hypertension   . Coronary artery disease     a. 05/2012 Cath/PCI: LM 40ost, 50-60d, LAD 99p ruptured plaque (3.0x28 DES), LCX 50p/m, RCA 30-40p, 15m EF 65-70%.  . Type 2 diabetes mellitus ([place]   . GERD (gastroesophageal reflux disease)   . Phimosis     a. s/p circumcision 2015.  .Marland KitchenHyperlipidemia   . History of concussion     1976--  NO RESIDUAL  . Iron deficiency anemia   . History of GI bleed     a. UGIB 07/2012;  b. 01/2014 admission with GIB/FOB stool req 1U prbc's->EGD showed portal  gastropathy, barrett's esoph, and chronic active h. pylori gastritis.  .Marland KitchenHistory of gout     2007 &  2008  LEFT LEG-- NO ISSUE SINCE  . Chronic low back pain   . OSA (obstructive sleep apnea)     PULMOLOGIST-  DR CLANCE--  MODERATE OSA  STARTED CPAP 2012--  BUT CURRENTLY HAS NOT USED PAST 6 MONTHS  . Hepatic cirrhosis ([place]     a. Dx 01/2014 - CT a/p   . Kidney stones   . Asthma   . History of blood transfusion     "related to bleeding ulcers"  . Arthritis     "joints tighten up sometimes" (03/27/2104)  . OA (osteoarthritis of spine)     LOWER BACK--  INTERMITTANT LEFT LEG NUMBNESS  . Chronic lower back pain     Past Surgical History  Procedure Laterality Date  . Esophagogastroduodenoscopy  08/15/2012    Procedure: ESOPHAGOGASTRODUODENOSCOPY (EGD);  Surgeon: SWonda Horner MD;  [facility];  Service: Endoscopy;  Laterality: N/A;  . Ankle fracture surgery Right 1989    "plate put in"  . Laparoscopic umbilical hernia repair w/ mesh  06-06-2011  . Open appendectomy w/ partial cecectomy  05-16-2004  . Dobutamine stress echo  06-08-2012    MODERATE HYPOKINESIS/ ISCHEMIA MID INFERIOR WALL  . Nephrolithotomy  1990'S  . Circumcision N/A 09/09/2013    Procedure: CIRCUMCISION ADULT;  Surgeon: DBernestine Amass MD;  [facility]  Service: Urology;  Laterality: N/A;  . Esophagogastroduodenoscopy N/A 02/17/2014    Procedure: ESOPHAGOGASTRODUODENOSCOPY (EGD);  Surgeon: MJeryl Columbia MD;  [facility];  Service: Endoscopy;  Laterality: N/A;  . Hernia repair    . Appendectomy  05-16-2004    open  . Colectomy  05-16-2004  . Coronary angioplasty with stent placement  06/28/2012  DR COOPER    PCI W/  X1 [facility] to [facility] LAD/  LM  40% OSTIAL & 50-60% DISTAL /  50% PROX LCX/  30-40% PROX RCA & 50% MID RCA/   LVEF 65-70%  . Coronary artery bypass graft N/A 03/31/2014    Procedure: CORONARY ARTERY BYPASS GRAFTING (CABG) times 3 using left internal mammary artery  and right saphenous vein.;  Surgeon: SMelrose Nakayama MD;  [facility]  Service: Open Heart Surgery;  Laterality: N/A;  . Intraoperative transesophageal echocardiogram N/A 03/31/2014    Procedure: INTRAOPERATIVE TRANSESOPHAGEAL ECHOCARDIOGRAM;  Surgeon: SMelrose Nakayama MD;  [facility]  Service: Open Heart Surgery;  Laterality: N/A;  . Percutaneous coronary stent intervention (pci-s) N/A 06/28/2012    Procedure: PERCUTANEOUS CORONARY STENT INTERVENTION (PCI-S);  Surgeon: MSherren Mocha MD;  [facility];  Service: Cardiovascular;  Laterality: N/A;  . Left heart catheterization with coronary angiogram N/A 03/28/2014    Procedure: LEFT HEART CATHETERIZATION WITH CORONARY ANGIOGRAM;  Surgeon:  Sinclair Grooms, MD;  [facility];  Service: Cardiovascular;  Laterality: N/A;     Current Outpatient Prescriptions  Medication Sig Dispense Refill  . aspirin EC 81 MG tablet Take 1 tablet (81 mg total) by mouth 2 (two) times daily.    Marland Kitchen atorvastatin (LIPITOR) 40 MG tablet Take 1 tablet (40 mg total) by mouth daily at 6 PM. 90 tablet 3  . ferrous sulfate 325 (65 FE) MG tablet Take 325 mg by mouth 2 (two) times daily with a meal.     . furosemide (LASIX) 40 MG tablet Take 1 tablet (40 mg total) by mouth 2 (two) times daily. 180 tablet 3  . losartan (COZAAR) 50 MG tablet Take 1 tablet (50 mg total) by mouth daily. 90 tablet 3  . metFORMIN (GLUCOPHAGE) 500 MG tablet Take 500 mg by mouth 2 (two) times daily with a meal.    . ranitidine (ZANTAC) 150 MG capsule Take 150 mg by mouth 2 (two) times daily.    Marland Kitchen gabapentin (NEURONTIN) 100 MG capsule Take 1 capsule (100 mg total) by mouth 2 (two) times daily. (Patient not taking: Reported on 12/08/2015) 60 capsule 3   No current facility-administered medications for this visit.    Allergies:   Review of patient's allergies indicates no known allergies.    Social History:  The patient  reports that he quit smoking about 19 years  ago. His smoking use included Cigarettes. He has a 80 pack-year smoking history. He has never used smokeless tobacco. He reports that he drinks alcohol. He reports that he does not use illicit drugs.   Family History:  The patient's family history includes Cancer in his brother, father, mother, sister, and sister; Lung cancer in his sister.    ROS:  Please see the history of present illness.    Review of Systems: Constitutional:  denies fever, chills, diaphoresis, appetite change and fatigue.  HEENT: denies photophobia, eye pain, redness, hearing loss, ear pain, congestion, sore throat, rhinorrhea, sneezing, neck pain, neck stiffness and tinnitus.  Respiratory: denies SOB, DOE, cough, chest tightness, and wheezing.  Cardiovascular: admits to chest pain,    Gastrointestinal: denies nausea, vomiting, abdominal pain, diarrhea, constipation, blood in stool.  Genitourinary: denies dysuria, urgency, frequency, hematuria, flank pain and difficulty urinating.  Musculoskeletal: denies  myalgias, back pain, joint swelling, arthralgias and gait problem.   Skin: denies pallor, rash and wound.  Neurological: denies dizziness, seizures, syncope, weakness, light-headedness, numbness and headaches.   Hematological: denies adenopathy, easy bruising, personal or family bleeding history.  Psychiatric/ Behavioral: denies suicidal ideation, mood changes, confusion, nervousness, sleep disturbance and agitation.       All other systems are reviewed and negative.    PHYSICAL EXAM: VS:  BP 130/68 mmHg  Pulse 84  Ht '5\' 8"'$  (1.727 m)  Wt 269 lb 1.9 oz (122.072 kg)  BMI 40.93 kg/m2 , BMI Body mass index is 40.93 kg/(m^2). GEN: Well nourished, well developed, in no acute distress HEENT: normal Neck: no JVD, carotid bruits, or masses Cardiac: RRR; no murmurs, rubs, or gallops,  2 + leg edema ,  He has left sided chest wall tenderness  Respiratory:  clear to auscultation bilaterally, normal work of  breathing GI: soft, nontender, nondistended, + BS MS: no deformity or atrophy Skin: warm and dry, no rash Neuro:  Strength and sensation are intact Psych: normal   EKG:  EKG is ordered today. ECG shows NSR at 65.  , sinus arrhythmia, inc. RBBB.  Recent Labs: 06/09/2015: ALT 11; BUN 17; Creat 0.75; Potassium 4.3; Sodium 137 09/15/2015: TSH 1.86    Lipid Panel    Component Value Date/Time   CHOL 92* 06/09/2015 1609   TRIG 119 06/09/2015 1609   HDL 28* 06/09/2015 1609   CHOLHDL 3.3 06/09/2015 1609   VLDL 24 06/09/2015 1609   LDLCALC 40 06/09/2015 1609      Wt Readings from Last 3 Encounters:  12/08/15 269 lb 1.9 oz (122.072 kg)  11/12/15 275 lb (124.739 kg)  09/15/15 274 lb (124.286 kg)     Other studies Reviewed: Additional studies/ records that were reviewed today include: . Review of the above records demonstrates:    ASSESSMENT AND PLAN:  1.  CAD : [place] is doing well. He's not had any episodes of angina. He does have some chest soreness .   This pain has been present since his CABG. Is likely MSK     2. Essential hypertension:  Blood pressure is well-controlled. Continue current medications  3. Diabetes mellitus: Followed by his general medical doctor  4. Obesity:  I've encouraged him to continue to work on her diet and exercise program.  5. Hyperlipidemia: Continue current dose of atorvastatin, check lipids in 1 year   6. Leg edema :   Has significant leg edema . Still eats canned foods and very salty diet .    Advised him to avoid canned foods. Avoid fast foods.   Avoid hot dogs, ham, bologna, bacon, sausage.    advised him to wear compression hose. - he has them at home   Current medicines are reviewed at length with the patient today.  The patient does not have concerns regarding medicines.  The following changes have been made:  no change  Labs/ tests ordered today include:   Orders Placed This Encounter  Procedures  . Basic metabolic panel   . Hepatic function panel  . Lipid panel    Disposition:   FU with me in 1 year  for OV , fasting labs and ECG     Signed, Nahser, Wonda Cheng, MD  12/08/2015 12:29 PM    [place], [place], [place]  [address] Phone: [phone]; Fax: [phone]

## 2015-12-08 NOTE — Patient Instructions (Addendum)
.Medication Instructions:  Your physician recommends that you continue on your current medications as directed. Please refer to the Current Medication list given to you today.   Labwork: Your physician recommends that you return for a FASTING lipid profile,bmet,lft in 1 year   Testing/Procedures: None ordered  Follow-Up: Your physician wants you to follow-up in: 1 year with Dr.Nahser You will receive a reminder letter in the mail two months in advance. If you don't receive a letter, please call our office to schedule the follow-up appointment.   Any Other Special Instructions Will Be Listed Below (If Applicable).     If you need a refill on your cardiac medications before your next appointment, please call your pharmacy.    Low-Sodium Eating Plan    Sodium raises blood pressure and causes water to be held in the body. Getting less sodium from food will help lower your blood pressure, reduce any swelling, and protect your heart, liver, and kidneys. We get sodium by adding salt (sodium chloride) to food. Most of our sodium comes from canned, boxed, and frozen foods. Restaurant foods, fast foods, and pizza are also very high in sodium. Even if you take medicine to lower your blood pressure or to reduce fluid in your body, getting less sodium from your food is important.  WHAT IS MY PLAN?  Most people should limit their sodium intake to 2,300 mg a day. Your health care provider recommends that you limit your sodium intake to __________ a day.  WHAT DO I NEED TO KNOW ABOUT THIS EATING PLAN?  For the low-sodium eating plan, you will follow these general guidelines:  Choose foods with a % Daily Value for sodium of less than 5% (as listed on the food label).  Use salt-free seasonings or herbs instead of table salt or sea salt.  Check with your health care provider or pharmacist before using salt substitutes.  Eat fresh foods.  Eat more vegetables and fruits.  Limit canned vegetables. If  you do use them, rinse them well to decrease the sodium.  Limit cheese to 1 oz (28 g) per day.  Eat lower-sodium products, often labeled as "lower sodium" or "no salt added."  Avoid foods that contain monosodium glutamate (MSG). MSG is sometimes added to [place] food and some canned foods.  Check food labels (Nutrition Facts labels) on foods to learn how much sodium is in one serving.  Eat more home-cooked food and less restaurant, buffet, and fast food.  When eating at a restaurant, ask that your food be prepared with less salt, or no salt if possible.  HOW DO I READ FOOD LABELS FOR SODIUM INFORMATION?  The Nutrition Facts label lists the amount of sodium in one serving of the food. If you eat more than one serving, you must multiply the listed amount of sodium by the number of servings.  Food labels may also identify foods as:  Sodium free--Less than 5 mg in a serving.  Very low sodium--35 mg or less in a serving.  Low sodium--140 mg or less in a serving.  Light in sodium--50% less sodium in a serving. For example, if a food that usually has 300 mg of sodium is changed to become light in sodium, it will have 150 mg of sodium.  Reduced sodium--25% less sodium in a serving. For example, if a food that usually has 400 mg of sodium is changed to reduced sodium, it will have 300 mg of sodium. WHAT FOODS CAN I EAT?  Grains  Low-sodium cereals, including oats, puffed wheat and rice, and shredded wheat cereals. Low-sodium crackers. Unsalted rice and pasta. Lower-sodium bread.  Vegetables  Frozen or fresh vegetables. Low-sodium or reduced-sodium canned vegetables. Low-sodium or reduced-sodium tomato sauce and paste. Low-sodium or reduced-sodium tomato and vegetable juices.  Fruits  Fresh, frozen, and canned fruit. Fruit juice.  Meat and Other Protein Products  Low-sodium canned tuna and salmon. Fresh or frozen meat, poultry, seafood, and fish. Lamb. Unsalted nuts. Dried beans, peas, and lentils  without added salt. Unsalted canned beans. Homemade soups without salt. Eggs.  Dairy  Milk. Soy milk. Ricotta cheese. Low-sodium or reduced-sodium cheeses. Yogurt.  Condiments  Fresh and dried herbs and spices. Salt-free seasonings. Onion and garlic powders. Low-sodium varieties of mustard and ketchup. Fresh or refrigerated horseradish. Lemon juice.  Fats and Oils  Reduced-sodium salad dressings. Unsalted butter.  Other  Unsalted popcorn and pretzels.  The items listed above may not be a complete list of recommended foods or beverages. Contact your dietitian for more options.  WHAT FOODS ARE NOT RECOMMENDED?  Grains  Instant hot cereals. Bread stuffing, pancake, and biscuit mixes. Croutons. Seasoned rice or pasta mixes. Noodle soup cups. Boxed or frozen macaroni and cheese. Self-rising flour. Regular salted crackers.  Vegetables  Regular canned vegetables. Regular canned tomato sauce and paste. Regular tomato and vegetable juices. Frozen vegetables in sauces. Salted [place] fries. Olives. Angie Fava. Relishes. Sauerkraut. Salsa.  Meat and Other Protein Products  Salted, canned, smoked, spiced, or pickled meats, seafood, or fish. Bacon, ham, sausage, hot dogs, corned beef, chipped beef, and packaged luncheon meats. Salt pork. Jerky. Pickled herring. Anchovies, regular canned tuna, and sardines. Salted nuts.  Dairy  Processed cheese and cheese spreads. Cheese curds. Blue cheese and cottage cheese. Buttermilk.  Condiments  Onion and garlic salt, seasoned salt, table salt, and sea salt. Canned and packaged gravies. Worcestershire sauce. Tartar sauce. Barbecue sauce. Teriyaki sauce. Soy sauce, including reduced sodium. Steak sauce. Fish sauce. Oyster sauce. Cocktail sauce. Horseradish that you find on the shelf. Regular ketchup and mustard. Meat flavorings and tenderizers. Bouillon cubes. Hot sauce. Tabasco sauce. Marinades. Taco seasonings. Relishes.  Fats and Oils  Regular salad dressings. Salted  butter. Margarine. Ghee. Bacon fat.  Other  Potato and tortilla chips. Corn chips and puffs. Salted popcorn and pretzels. Canned or dried soups. Pizza. Frozen entrees and pot pies.  The items listed above may not be a complete list of foods and beverages to avoid. Contact your dietitian for more information.  This information is not intended to replace advice given to you by your health care provider. Make sure you discuss any questions you have with your health care provider.  Document Released: 02/04/2002 Document Revised: 09/05/2014 Document Reviewed: 06/19/2013  Elsevier Interactive Patient Education [employer].

## 2015-12-18 ENCOUNTER — Telehealth: Payer: Self-pay

## 2015-12-18 NOTE — Telephone Encounter (Signed)
Call to residence and son answered; [name] was not available but agreed to ask him to call [place]. Need to schedule AWV or need to know where and when he had an eye visit.

## 2016-01-05 ENCOUNTER — Ambulatory Visit (INDEPENDENT_AMBULATORY_CARE_PROVIDER_SITE_OTHER): Payer: PPO

## 2016-01-05 VITALS — BP 118/60 | Ht 66.0 in | Wt 272.5 lb

## 2016-01-05 DIAGNOSIS — Z23 Encounter for immunization: Secondary | ICD-10-CM | POA: Diagnosis not present

## 2016-01-05 DIAGNOSIS — E118 Type 2 diabetes mellitus with unspecified complications: Secondary | ICD-10-CM | POA: Diagnosis not present

## 2016-01-05 DIAGNOSIS — Z0000 Encounter for general adult medical examination without abnormal findings: Secondary | ICD-10-CM

## 2016-01-05 NOTE — Progress Notes (Addendum)
Subjective:   [name] is a 70 y.o. male who presents for Medicare Annual/Subsequent preventive examination.  Review of Systems:  HRA assessment completed during visit; [name]   The Patient was informed that this wellness visit is to identify risk and educate on how to reduce risk for increase disease through lifestyle changes.   ROS deferred to CPE exam with physician  Family and medical hx given below;   Medical DES 05/2012/ s/p CABG; did not go to cardiac rehab DM 2; diabetes A1c 6.7  A1c 6.7  Lipids Cho 93; Trig 119; HDL 28; LDL40 Followed By Dr. Acie Fredrickson BMI 43  Tobacco; Does not smoke anymore; has not smoked in 30 years ETOH: Drinks Rum; 2 to 4 glasses once or twice a week;  Beer as well; Does not drink as much as he did. Motivation to quit; a 5;  To increase motivation it would take catastrophic event;   In to see cardiologist 4/11; right leg has chronic edema per the patient;  does decrease at times; states his circulation is good to foot; Cardiologist discussed with him   BMI: 43  Diet;  Eats whatever I can find; sausage biscuit; grits; cereal  Lunch; does not eat lunch until evening Supper: mostly baked; grilled; chicken or hamburger Collard greens or pork and beans; ;potatoes  Exercise; Walks around home; Walks up and down the road; stays on feet all the time;   HOME SAFETY;  Lives with family; multi level home;  Lives on ground floor w bathroom;  Shower in tub; rents home  Community safe; yes; locks things up Smoke detectors: yes Firearms; does keep in a safe place  Depression: no verbalized depression Few dogs in home;   Cognitive; ? MMSE deferred; son denies memory issues; the patient has 8th grade education; Oriented to date; Could subtract 3 from 20; Denies issues with AD8 Scale;  Son is dyslexic and states patient has learning d/a as well;   HCPOA; No but agreed to take  a copy   Fall assessment / no falls  Mobilization and Functional  losses from last year to this year? no  Sleep pattern changes; sleeps well  Urinary or fecal incontinence reviewed/ no verbalized    Counseling Health Maintenance  Colonoscopy; 07/2013 repeated 2024 EKG: 02/2015  Hearing ? 500 in right; Needs hearing screen;  Did give him resource for [facility] for assistance with hearing aid  Ophthalmology exam; has not been in awhile; Recommended he get eye check when he can.   Immunizations Due:  Discussed shingles; will defer to pharmacy  Prevnar 13 due and agreed to update this today  Advanced Directive; will update as needed  Health Recommendations and Referrals  Referred to the [facility] [place];  Son states he would be willing to learn to fix better meals for his dad.   Current Care Team reviewed and updated  Dr. Loanne Drilling  Dr. Rayann Heman   Cardiac Risk Factors include: advanced age (>41mn, >>56women);diabetes mellitus;dyslipidemia;family history of premature cardiovascular disease;hypertension;male gender;obesity (BMI >30kg/m2);sedentary lifestyle     Objective:    Vitals: BP 118/60 mmHg  Ht '5\' 6"'$  (1.676 m)  Wt 272 lb 8 oz (123.605 kg)  BMI 44.00 kg/m2  Body mass index is 44 kg/(m^2).  Tobacco History  Smoking status  . Former Smoker -- 2.00 packs/day for 40 years  . Types: Cigarettes  . Quit date: 08/29/1996  Smokeless tobacco  . Never Used     Counseling  given: Yes   Past Medical History  Diagnosis Date  . Unspecified essential hypertension   . Coronary artery disease     a. 05/2012 Cath/PCI: LM 40ost, 50-60d, LAD 99p ruptured plaque (3.0x28 DES), LCX 50p/m, RCA 30-40p, 74m EF 65-70%.  . [place] 2 diabetes [place]   . GERD (gastroesophageal reflux disease)   . Phimosis     a. s/p circumcision 2015.  .Marland KitchenHyperlipidemia   . History of concussion     1976--  NO RESIDUAL  . Iron deficiency anemia   . History of GI bleed     a. UGIB 07/2012;  b. 01/2014 admission with GIB/FOB stool req 1U prbc's->EGD  showed portal gastropathy, barrett's esoph, and chronic active h. pylori gastritis.  .Marland KitchenHistory of gout     2007 &  2008  LEFT LEG-- NO ISSUE SINCE  . Chronic low back pain   . OSA (obstructive sleep apnea)     PULMOLOGIST-  DR CLANCE--  MODERATE OSA  STARTED CPAP 2012--  BUT CURRENTLY HAS NOT USED PAST 6 MONTHS  . [place]     a. Dx 01/2014 - CT a/p   . Kidney stones   . Asthma   . History of blood transfusion     "related to bleeding ulcers"  . Arthritis     "joints tighten up sometimes" (03/27/2104)  . OA (osteoarthritis of spine)     LOWER BACK--  INTERMITTANT LEFT LEG NUMBNESS  . Chronic lower back pain    Past Surgical History  Procedure Laterality Date  . Esophagogastroduodenoscopy  08/15/2012    Procedure: ESOPHAGOGASTRODUODENOSCOPY (EGD);  Surgeon: SWonda Horner MD;  [facility];  Service: Endoscopy;  Laterality: N/A;  . Ankle fracture surgery Right 1989    "plate put in"  . Laparoscopic umbilical hernia repair w/ mesh  06-06-2011  . Open appendectomy w/ partial cecectomy  05-16-2004  . Dobutamine stress echo  06-08-2012    MODERATE HYPOKINESIS/ ISCHEMIA MID INFERIOR WALL  . Nephrolithotomy  1990'S  . Circumcision N/A 09/09/2013    Procedure: CIRCUMCISION ADULT;  Surgeon: DBernestine Amass MD;  [facility]  Service: Urology;  Laterality: N/A;  . Esophagogastroduodenoscopy N/A 02/17/2014    Procedure: ESOPHAGOGASTRODUODENOSCOPY (EGD);  Surgeon: MJeryl Columbia MD;  [facility];  Service: Endoscopy;  Laterality: N/A;  . Hernia repair    . Appendectomy  05-16-2004    open  . Colectomy  05-16-2004  . Coronary angioplasty with stent placement  06/28/2012  DR COOPER    PCI W/  X1 DES to [place] LAD/  LM  40% OSTIAL & 50-60% DISTAL /  50% PROX LCX/  30-40% PROX RCA & 50% MID RCA/   LVEF 65-70%  . Coronary artery bypass graft N/A 03/31/2014    Procedure: CORONARY ARTERY BYPASS GRAFTING (CABG) times 3 using left internal  mammary artery and right saphenous vein.;  Surgeon: SMelrose Nakayama MD;  [place]  Service: Open Heart Surgery;  Laterality: N/A;  . Intraoperative transesophageal echocardiogram N/A 03/31/2014    Procedure: INTRAOPERATIVE TRANSESOPHAGEAL ECHOCARDIOGRAM;  Surgeon: SMelrose Nakayama MD;  [place] [facility]  Service: Open Heart Surgery;  Laterality: N/A;  . Percutaneous coronary stent intervention (pci-s) N/A 06/28/2012    Procedure: PERCUTANEOUS CORONARY STENT INTERVENTION (PCI-S);  Surgeon: MSherren Mocha MD;  [facility];  Service: Cardiovascular;  Laterality: N/A;  . Left heart catheterization with coronary angiogram N/A 03/28/2014    Procedure: LEFT  HEART CATHETERIZATION WITH CORONARY ANGIOGRAM;  Surgeon: Sinclair Grooms, MD;  [facility];  Service: Cardiovascular;  Laterality: N/A;   Family History  Problem Relation Age of Onset  . Coronary artery disease    . Diabetes    . Colon cancer    . Lung cancer Sister   . Cancer Sister     lung  . Cancer Brother     lung  . Cancer Sister     lung  . Cancer Father     died in his 33s.  . Cancer Mother    History  Sexual Activity  . Sexual Activity: Yes    Outpatient Encounter Prescriptions as of 01/05/2016  Medication Sig  . aspirin EC 81 MG tablet Take 1 tablet (81 mg total) by mouth 2 (two) times daily.  Marland Kitchen atorvastatin (LIPITOR) 40 MG tablet Take 1 tablet (40 mg total) by mouth daily at 6 PM.  . ferrous sulfate 325 (65 FE) MG tablet Take 325 mg by mouth 2 (two) times daily with a meal.   . furosemide (LASIX) 40 MG tablet Take 1 tablet (40 mg total) by mouth 2 (two) times daily.  Marland Kitchen losartan (COZAAR) 50 MG tablet Take 1 tablet (50 mg total) by mouth daily.  . metFORMIN (GLUCOPHAGE) 500 MG tablet Take 500 mg by mouth 2 (two) times daily with a meal.  . ranitidine (ZANTAC) 150 MG capsule Take 150 mg by mouth 2 (two) times daily.  Marland Kitchen gabapentin (NEURONTIN) 100 MG capsule Take 1 capsule (100 mg total) by  mouth 2 (two) times daily. (Patient not taking: Reported on 12/08/2015)   No facility-administered encounter medications on file as of 01/05/2016.    Activities of Daily Living In your present state of health, do you have any difficulty performing the following activities: 01/05/2016  Hearing? Y  Vision? N  Difficulty concentrating or making decisions? N  Walking or climbing stairs? Y  Dressing or bathing? N  Doing errands, shopping? N  Preparing Food and eating ? Y  Using the Toilet? N  In the past six months, have you accidently leaked urine? N  Do you have problems with loss of bowel control? N  Managing your Medications? N  Managing your Finances? N  Housekeeping or managing your Housekeeping? N    Patient Care Team: [name], MD as PCP - General (Internal Medicine) Kathee Delton, MD as Consulting Physician (Pulmonary Disease) Greer Pickerel, MD as Consulting Physician (General Surgery) Thayer Headings, MD (Cardiology) Wonda Horner, MD (Gastroenterology) Rana Snare, MD (Urology) Hayden Pedro, MD (Ophthalmology)   Assessment:     Exercise Activities and Dietary recommendations Current Exercise Habits: Home exercise routine, Type of exercise: walking (Son planning on joining [employer] ), Intensity: Mild  Goals    . Exercise 150 minutes per week (moderate activity)     Will consider walking; 30 minutes; son is trying to get him to [employer];  Doctor ok's walking to "build heart up".       Fall Risk Fall Risk  01/05/2016 09/15/2015 09/11/2013  Falls in the past year? No No No   Depression Screen PHQ 2/9 Scores 01/05/2016 09/15/2015 09/11/2013  PHQ - 2 Score 0 0 0    Cognitive Testing No flowsheet data found.  Immunization History  Administered Date(s) Administered  . Influenza Split 08/29/2012, 03/29/2014  . Influenza,inj,Quad PF,36+ Mos 09/11/2013  . Pneumococcal Conjugate-13 01/05/2016  . Pneumococcal Polysaccharide-23 09/26/2011  . Td  09/26/2011   Screening Tests Health Maintenance  Topic Date Due  . Hepatitis C Screening  10/21/1945  . ZOSTAVAX  03/10/2006  . PNA vac Low Risk Adult (2 of 2 - PCV13) 09/25/2012  . OPHTHALMOLOGY EXAM  04/05/2014  . INFLUENZA VACCINE  07/14/2016 (Originally 03/29/2016)  . HEMOGLOBIN A1C  03/14/2016  . FOOT EXAM  09/14/2016  . TETANUS/TDAP  09/25/2021  . COLONOSCOPY  08/07/2023      Plan:     Due Hep C: educated Zostavax; will take at pharmacy or discuss with doctor;  PCV 13: agreed to take today Eye Exam/ understands he needs to have one completed   During the course of the visit the patient was educated and counseled about the following appropriate screening and preventive services:   Vaccines to include Pneumoccal, Influenza, Hepatitis B, Td, Zostavax, HCV/ given prevnar 13  Electrocardiogram 02/2015  Cardiovascular Disease/ deferred; just seen cardiology  Colorectal cancer screening/ 08/07/2023  Diabetes screening/ up to 6.7/ continues metformin  Prostate Cancer Screening/ deferred  Glaucoma screening/ needs exam   Nutrition counseling: son agrees for both to go to [facility] [place]  Smoking cessation counseling /quit 98; 80 year pack hx  Patient Instructions (the written plan) was given to the patient.    The patient states he is  out of Metformin; Feels it did help; request refill; Call to the pharmacy and there are refills on this med; The patient was notified that he can pick med up; to call and make apt with Dr. Sharlet Salina if he needs does increased.    LPFXT,KWIOX, RN  01/05/2016   Medical screening examination/treatment/procedure(s) were performed by non-physician practitioner and as supervising physician I was immediately available for consultation/collaboration. I agree with above. Binnie Rail, MD

## 2016-01-05 NOTE — Patient Instructions (Addendum)
[name] , Thank you for taking time to come for your Medicare Wellness Visit. I appreciate your ongoing commitment to your health goals. Please review the following plan we discussed and let me know if I can assist you in the future.   Diabetes and weight loss; referral completed   [employer]; [phone] Sr. Line; [phone]  Www.suntopia.org  Diabetes and weight loss; https://connects.RelaxingBalm.es.aspx They will call you to set up an apt  [facility] [place]  No reviews  [facility]  [place]  [phone]  Interested in going to Diabetes and nutrition center Address: [address], [place],  [address]  Phone: [phone]  Will take prevnar today  These are the goals we discussed: Goals    . Exercise 150 minutes per week (moderate activity)     Will consider walking; 30 minutes; son is trying to get him to [employer];  Doctor ok's walking to "build heart up".        This is a list of the screening recommended for you and due dates:  Health Maintenance  Topic Date Due  .  Hepatitis C: One time screening is recommended by Center for Disease Control  (CDC) for  adults born from [birth year] through 1965.   Mar 16, 1946  . Shingles Vaccine  03/10/2006  . Pneumonia vaccines (2 of 2 - PCV13) 09/25/2012  . Eye exam for diabetics  04/05/2014  . Flu Shot  07/14/2016*  . Hemoglobin A1C  03/14/2016  . Complete foot exam   09/14/2016  . Tetanus Vaccine  09/25/2021  . Colon Cancer Screening  08/07/2023  *Topic was postponed. The date shown is not the original due date.     Health Maintenance, Male A healthy lifestyle and preventative care can promote health and wellness.  Maintain regular health, dental, and eye exams.  Eat a healthy diet. Foods like vegetables, fruits, whole grains, low-fat dairy products, and lean protein foods contain the nutrients  you need and are low in calories. Decrease your intake of foods high in solid fats, added sugars, and salt. Get information about a proper diet from your health care provider, if necessary.  Regular physical exercise is one of the most important things you can do for your health. Most adults should get at least 150 minutes of moderate-intensity exercise (any activity that increases your heart rate and causes you to sweat) each week. In addition, most adults need muscle-strengthening exercises on 2 or more days a week.   Maintain a healthy weight. The body mass index (BMI) is a screening tool to identify possible weight problems. It provides an estimate of body fat based on height and weight. Your health care provider can find your BMI and can help you achieve or maintain a healthy weight. For males 20 years and older:  A BMI below 18.5 is considered underweight.  A BMI of 18.5 to 24.9 is normal.  A BMI of 25 to 29.9 is considered overweight.  A BMI of 30 and above is considered obese.  Maintain normal blood lipids and cholesterol by exercising and minimizing your intake of saturated fat. Eat a balanced diet with plenty of fruits and vegetables. Blood tests for lipids and cholesterol should begin at age 50 and be repeated every 5 years. If your lipid or cholesterol levels are high, you are over age 56, or you are at high risk for heart disease, you may need your cholesterol levels checked more  frequently.Ongoing high lipid and cholesterol levels should be treated with medicines if diet and exercise are not working.  If you smoke, find out from your health care provider how to quit. If you do not use tobacco, do not start.  Lung cancer screening is recommended for adults aged 79-80 years who are at high risk for developing lung cancer because of a history of smoking. A yearly low-dose CT scan of the lungs is recommended for people who have at least a 30-pack-year history of smoking and are current  smokers or have quit within the past 15 years. A pack year of smoking is smoking an average of 1 pack of cigarettes a day for 1 year (for example, a 30-pack-year history of smoking could mean smoking 1 pack a day for 30 years or 2 packs a day for 15 years). Yearly screening should continue until the smoker has stopped smoking for at least 15 years. Yearly screening should be stopped for people who develop a health problem that would prevent them from having lung cancer treatment.  If you choose to drink alcohol, do not have more than 2 drinks per day. One drink is considered to be 12 oz (360 mL) of beer, 5 oz (150 mL) of wine, or 1.5 oz (45 mL) of liquor.  Avoid the use of street drugs. Do not share needles with anyone. Ask for help if you need support or instructions about stopping the use of drugs.  High blood pressure causes heart disease and increases the risk of stroke. High blood pressure is more likely to develop in:  People who have blood pressure in the end of the normal range (100-139/85-89 mm Hg).  People who are overweight or obese.  People who are African American.  If you are 41-78 years of age, have your blood pressure checked every 3-5 years. If you are 61 years of age or older, have your blood pressure checked every year. You should have your blood pressure measured twice--once when you are at a hospital or clinic, and once when you are not at a hospital or clinic. Record the average of the two measurements. To check your blood pressure when you are not at a hospital or clinic, you can use:  An automated blood pressure machine at a pharmacy.  A home blood pressure monitor.  If you are 79-33 years old, ask your health care provider if you should take aspirin to prevent heart disease.  Diabetes screening involves taking a blood sample to check your fasting blood sugar level. This should be done once every 3 years after age 19 if you are at a normal weight and without risk factors  for diabetes. Testing should be considered at a younger age or be carried out more frequently if you are overweight and have at least 1 risk factor for diabetes.  Colorectal cancer can be detected and often prevented. Most routine colorectal cancer screening begins at the age of 35 and continues through age 60. However, your health care provider may recommend screening at an earlier age if you have risk factors for colon cancer. On a yearly basis, your health care provider may provide home test kits to check for hidden blood in the stool. A small camera at the end of a tube may be used to directly examine the colon (sigmoidoscopy or colonoscopy) to detect the earliest forms of colorectal cancer. Talk to your health care provider about this at age 53 when routine screening begins. A direct exam of  the colon should be repeated every 5-10 years through age 18, unless early forms of precancerous polyps or small growths are found.  People who are at an increased risk for hepatitis B should be screened for this virus. You are considered at high risk for hepatitis B if:  You were born in a country where hepatitis B occurs often. Talk with your health care provider about which countries are considered high risk.  Your parents were born in a high-risk country and you have not received a shot to protect against hepatitis B (hepatitis B vaccine).  You have HIV or AIDS.  You use needles to inject street drugs.  You live with, or have sex with, someone who has hepatitis B.  You are a man who has sex with other men (MSM).  You get hemodialysis treatment.  You take certain medicines for conditions like cancer, organ transplantation, and autoimmune conditions.  Hepatitis C blood testing is recommended for all people born from [birth year] through 1965 and any individual with known risk factors for hepatitis C.  Healthy men should no longer receive prostate-specific antigen (PSA) blood tests as part of routine cancer  screening. Talk to your health care provider about prostate cancer screening.  Testicular cancer screening is not recommended for adolescents or adult males who have no symptoms. Screening includes self-exam, a health care provider exam, and other screening tests. Consult with your health care provider about any symptoms you have or any concerns you have about testicular cancer.  Practice safe sex. Use condoms and avoid high-risk sexual practices to reduce the spread of sexually transmitted infections (STIs).  You should be screened for STIs, including gonorrhea and chlamydia if:  You are sexually active and are younger than 24 years.  You are older than 24 years, and your health care provider tells you that you are at risk for this type of infection.  Your sexual activity has changed since you were last screened, and you are at an increased risk for chlamydia or gonorrhea. Ask your health care provider if you are at risk.  If you are at risk of being infected with HIV, it is recommended that you take a prescription medicine daily to prevent HIV infection. This is called pre-exposure prophylaxis (PrEP). You are considered at risk if:  You are a man who has sex with other men (MSM).  You are a heterosexual man who is sexually active with multiple partners.  You take drugs by injection.  You are sexually active with a partner who has HIV.  Talk with your health care provider about whether you are at high risk of being infected with HIV. If you choose to begin PrEP, you should first be tested for HIV. You should then be tested every 3 months for as long as you are taking PrEP.  Use sunscreen. Apply sunscreen liberally and repeatedly throughout the day. You should seek shade when your shadow is shorter than you. Protect yourself by wearing long sleeves, pants, a wide-brimmed hat, and sunglasses year round whenever you are outdoors.  Tell your health care provider of new moles or changes in  moles, especially if there is a change in shape or color. Also, tell your health care provider if a mole is larger than the size of a pencil eraser.  A one-time screening for abdominal aortic aneurysm (AAA) and surgical repair of large AAAs by ultrasound is recommended for men aged 23-75 years who are current or former smokers.  Stay current with your  vaccines (immunizations).   This information is not intended to replace advice given to you by your health care provider. Make sure you discuss any questions you have with your health care provider.   Document Released: 02/11/2008 Document Revised: 09/05/2014 Document Reviewed: 01/10/2011 Elsevier Interactive Patient Education 2016 [employer].  Hearing Loss Hearing loss is a partial or total loss of the ability to hear. This can be temporary or permanent, and it can happen in one or both ears. Hearing loss may be referred to as deafness. Medical care is necessary to treat hearing loss properly and to prevent the condition from getting worse. Your hearing may partially or completely come back, depending on what caused your hearing loss and how severe it is. In some cases, hearing loss is permanent. CAUSES Common causes of hearing loss include:   Too much wax in the ear canal.   Infection of the ear canal or middle ear.   Fluid in the middle ear.   Injury to the ear or surrounding area.   An object stuck in the ear.   Prolonged exposure to loud sounds, such as music.  Less common causes of hearing loss include:   Tumors in the ear.   Viral or bacterial infections, such as meningitis.   A hole in the eardrum (perforated eardrum).  Problems with the hearing nerve that sends signals between the brain and the ear.  Certain medicines.  SYMPTOMS  Symptoms of this condition may include:  Difficulty telling the difference between sounds.  Difficulty following a conversation when there is background noise.  Lack of  response to sounds in your environment. This may be most noticeable when you do not respond to startling sounds.  Needing to turn up the volume on the television, radio, etc.  Ringing in the ears.  Dizziness.  Pain in the ears. DIAGNOSIS This condition is diagnosed based on a physical exam and a hearing test (audiometry). The audiometry test will be performed by a hearing specialist (audiologist). You may also be referred to an ear, nose, and throat (ENT) specialist (otolaryngologist).  TREATMENT Treatment for recent onset of hearing loss may include:   Ear wax removal.   Being prescribed medicines to prevent infection (antibiotics).   Being prescribed medicines to reduce inflammation (corticosteroids).  HOME CARE INSTRUCTIONS  If you were prescribed an antibiotic medicine, take it as told by your health care provider. Do not stop taking the antibiotic even if you start to feel better.  Take over-the-counter and prescription medicines only as told by your health care provider.  Avoid loud noises.   Return to your normal activities as told by your health care provider. Ask your health care provider what activities are safe for you.  Keep all follow-up visits as told by your health care provider. This is important. SEEK MEDICAL CARE IF:   You feel dizzy.   You develop new symptoms.   You vomit or feel nauseous.   You have a fever.  SEEK IMMEDIATE MEDICAL CARE IF:  You develop sudden changes in your vision.   You have severe ear pain.   You have new or increased weakness.  You have a severe headache.   This information is not intended to replace advice given to you by your health care provider. Make sure you discuss any questions you have with your health care provider.   Document Released: 08/15/2005 Document Revised: 05/06/2015 Document Reviewed: 12/31/2014 Elsevier Interactive Patient Education [employer].

## 2016-01-21 DIAGNOSIS — E119 Type 2 diabetes mellitus without complications: Secondary | ICD-10-CM | POA: Diagnosis not present

## 2016-01-26 ENCOUNTER — Ambulatory Visit: Payer: PPO | Admitting: Pulmonary Disease

## 2016-02-05 ENCOUNTER — Ambulatory Visit (INDEPENDENT_AMBULATORY_CARE_PROVIDER_SITE_OTHER): Payer: PPO | Admitting: Pulmonary Disease

## 2016-02-05 ENCOUNTER — Encounter: Payer: Self-pay | Admitting: Pulmonary Disease

## 2016-02-05 VITALS — BP 122/62 | HR 79 | Ht 66.0 in | Wt 266.0 lb

## 2016-02-05 DIAGNOSIS — R0609 Other forms of dyspnea: Secondary | ICD-10-CM | POA: Diagnosis not present

## 2016-02-05 DIAGNOSIS — E669 Obesity, unspecified: Secondary | ICD-10-CM | POA: Diagnosis not present

## 2016-02-05 DIAGNOSIS — G4733 Obstructive sleep apnea (adult) (pediatric): Secondary | ICD-10-CM | POA: Diagnosis not present

## 2016-02-05 DIAGNOSIS — E6609 Other obesity due to excess calories: Secondary | ICD-10-CM | POA: Insufficient documentation

## 2016-02-05 NOTE — Assessment & Plan Note (Signed)
Weight reduction 

## 2016-02-05 NOTE — Progress Notes (Signed)
Subjective:    Patient ID: [name], male    DOB: 1945-11-11, 70 y.o.   MRN: [id number]  HPI Pt is being seen for his OSA.  ROV (02/05/16)  Pt is here for f/u on his OSA.  Since last seen, he states he has not been using his bipap frequently  as he forgets and needs to clean it. Tolerates pressure. No new medical issues. Has not been been admitted the last yr.  Feels better some when he uses his  bipap.   Review of Systems  Constitutional: Negative.   HENT: Negative.   Eyes: Negative.   Respiratory: Positive for cough and shortness of breath.   Cardiovascular: Negative.   Gastrointestinal: Negative.   Endocrine: Negative.   Genitourinary: Negative.   Musculoskeletal: Negative.   Skin: Negative.   Allergic/Immunologic: Negative.   Neurological: Negative.   Hematological: Negative.   Psychiatric/Behavioral: Negative.    Past Medical History  Diagnosis Date  . Unspecified essential hypertension   . Coronary artery disease     a. 05/2012 Cath/PCI: LM 40ost, 50-60d, LAD 99p ruptured plaque (3.0x28 DES), LCX 50p/m, RCA 30-40p, 62m EF 65-70%.  . Type 2 diabetes mellitus (HRemy   . GERD (gastroesophageal reflux disease)   . Phimosis     a. s/p circumcision 2015.  .Marland KitchenHyperlipidemia   . History of concussion     1976--  NO RESIDUAL  . Iron deficiency anemia   . History of GI bleed     a. UGIB 07/2012;  b. 01/2014 admission with GIB/FOB stool req 1U prbc's->EGD showed portal gastropathy, barrett's esoph, and chronic active h. pylori gastritis.  .Marland KitchenHistory of gout     2007 &  2008  LEFT LEG-- NO ISSUE SINCE  . Chronic low back pain   . OSA (obstructive sleep apnea)     PULMOLOGIST-  DR CLANCE--  MODERATE OSA  STARTED CPAP 2012--  BUT CURRENTLY HAS NOT USED PAST 6 MONTHS  . Hepatic cirrhosis (HJal     a. Dx 01/2014 - CT a/p   . Kidney stones   . Asthma   . History of blood transfusion     "related to bleeding ulcers"  . Arthritis     "joints tighten up sometimes" (03/27/2104)    . OA (osteoarthritis of spine)     LOWER BACK--  INTERMITTANT LEFT LEG NUMBNESS  . Chronic lower back pain      Family History  Problem Relation Age of Onset  . Coronary artery disease    . Diabetes    . Colon cancer    . Lung cancer Sister   . Cancer Sister     lung  . Cancer Brother     lung  . Cancer Sister     lung  . Cancer Father     died in his 724s  . Cancer Mother      Past Surgical History  Procedure Laterality Date  . Esophagogastroduodenoscopy  08/15/2012    Procedure: ESOPHAGOGASTRODUODENOSCOPY (EGD);  Surgeon: SWonda Horner MD;  [facility];  Service: Endoscopy;  Laterality: N/A;  . Ankle fracture surgery Right 1989    "plate put in"  . Laparoscopic umbilical hernia repair w/ mesh  06-06-2011  . Open appendectomy w/ partial cecectomy  05-16-2004  . Dobutamine stress echo  06-08-2012    MODERATE HYPOKINESIS/ ISCHEMIA MID INFERIOR WALL  . Nephrolithotomy  1990'S  . Circumcision N/A 09/09/2013    Procedure: CIRCUMCISION ADULT;  Surgeon: DShanon Brow  Cy Blamer, MD;  Location[facility];  Service: Urology;  Laterality: N/A;  . Esophagogastroduodenoscopy N/A 02/17/2014    Procedure: ESOPHAGOGASTRODUODENOSCOPY (EGD);  Surgeon: Jeryl Columbia, MD;  [facility];  Service: Endoscopy;  Laterality: N/A;  . Hernia repair    . Appendectomy  05-16-2004    open  . Colectomy  05-16-2004  . Coronary angioplasty with stent placement  06/28/2012  DR COOPER    PCI W/  X1 DES to [place]. LAD/  LM  40% OSTIAL & 50-60% DISTAL /  50% PROX LCX/  30-40% PROX RCA & 50% MID RCA/   LVEF 65-70%  . Coronary artery bypass graft N/A 03/31/2014    Procedure: CORONARY ARTERY BYPASS GRAFTING (CABG) times 3 using left internal mammary artery and right saphenous vein.;  Surgeon: Melrose Nakayama, MD;  Location: [facility];  Service: Open Heart Surgery;  Laterality: N/A;  . Intraoperative transesophageal echocardiogram N/A 03/31/2014    Procedure: INTRAOPERATIVE  TRANSESOPHAGEAL ECHOCARDIOGRAM;  Surgeon: Melrose Nakayama, MD;  Location: [facility];  Service: Open Heart Surgery;  Laterality: N/A;  . Percutaneous coronary stent intervention (pci-s) N/A 06/28/2012    Procedure: PERCUTANEOUS CORONARY STENT INTERVENTION (PCI-S);  Surgeon: Sherren Mocha, MD;  Location: [facility] CATH LAB;  Service: Cardiovascular;  Laterality: N/A;  . Left heart catheterization with coronary angiogram N/A 03/28/2014    Procedure: LEFT HEART CATHETERIZATION WITH CORONARY ANGIOGRAM;  Surgeon: Sinclair Grooms, MD;  [facility] LAB;  Service: Cardiovascular;  Laterality: N/A;    Social History   Social History  . Marital Status: Married    Spouse Name: N/A  . Number of Children: N/A  . Years of Education: N/A   Occupational History  . Not on file.   Social History Main Topics  . Smoking status: Former Smoker -- 2.00 packs/day for 40 years    Types: Cigarettes    Quit date: 08/29/1996  . Smokeless tobacco: Never Used  . Alcohol Use: 0.0 oz/week    0 Standard drinks or equivalent per week     Comment: 03/27/2014  still drinks rum; a few glasses per week; and beer sometime; states he does not drink like he did   . Drug Use: No  . Sexual Activity: Yes   Other Topics Concern  . Not on file   Social History Narrative   Lives in [place] with wife, son, and son's family.     No Known Allergies   Outpatient Prescriptions Prior to Visit  Medication Sig Dispense Refill  . aspirin EC 81 MG tablet Take 1 tablet (81 mg total) by mouth 2 (two) times daily.    Marland Kitchen atorvastatin (LIPITOR) 40 MG tablet Take 1 tablet (40 mg total) by mouth daily at 6 PM. 90 tablet 3  . ferrous sulfate 325 (65 FE) MG tablet Take 325 mg by mouth 2 (two) times daily with a meal.     . furosemide (LASIX) 40 MG tablet Take 1 tablet (40 mg total) by mouth 2 (two) times daily. 180 tablet 3  . losartan (COZAAR) 50 MG tablet Take 1 tablet (50 mg total) by mouth daily. 90 tablet 3  . metFORMIN (GLUCOPHAGE) 500  MG tablet Take 500 mg by mouth 2 (two) times daily with a meal.    . ranitidine (ZANTAC) 150 MG capsule Take 150 mg by mouth 2 (two) times daily.    Marland Kitchen gabapentin (NEURONTIN) 100 MG capsule Take 1 capsule (100 mg total) by mouth 2 (two)  times daily. (Patient not taking: Reported on 12/08/2015) 60 capsule 3   No facility-administered medications prior to visit.   No orders of the defined types were placed in this encounter.          Objective:   Physical Exam  Vitals:  Filed Vitals:   02/05/16 1352  BP: 122/62  Pulse: 79  Height: '5\' 6"'$  (1.676 m)  Weight: 266 lb (120.657 kg)  SpO2: 93%    Constitutional/General:  Pleasant, well-nourished, well-developed, not in any distress,  Comfortably seating.  Well kempt  Body mass index is 42.95 kg/(m^2). Wt Readings from Last 3 Encounters:  02/05/16 266 lb (120.657 kg)  01/05/16 272 lb 8 oz (123.605 kg)  12/08/15 269 lb 1.9 oz (122.072 kg)    HEENT: Pupils equal and reactive to light and accommodation. Anicteric sclerae. Normal nasal mucosa.   No oral  lesions,  mouth clear,  oropharynx clear, no postnasal drip. (-) Oral thrush. No dental caries.  Airway - Mallampati class IV  Neck: No masses. Midline trachea. No JVD, (-) LAD. (-) bruits appreciated.  Respiratory/Chest: Grossly normal chest. (-) deformity. (-) Accessory muscle use.  Symmetric expansion. (-) Tenderness on palpation.  Resonant on percussion.  Diminished BS on both lower lung zones. (+) occasional  wheezing,  (-) crackles, rhonchi (-) egophony  Cardiovascular: Regular rate and  rhythm, heart sounds normal, no murmur or gallops, no peripheral edema  Gastrointestinal:  Normal bowel sounds. Soft, non-tender. No hepatosplenomegaly.  (-) masses.   Musculoskeletal:  Normal muscle tone. Normal gait.   Extremities: Grossly normal. (-) clubbing, cyanosis.  (-) edema  Skin: (-) rash,lesions seen.   Neurological/Psychiatric : alert, oriented to time, place,  person. Normal mood and affect            Assessment & Plan:  OSA (obstructive sleep apnea) NPSG 2012:  AHI 21/hr Auto 2012:  Optimal pressure 13cm Intolerant of cpap.  Stopped using 2014 Split night 07/2014:  AHI 17/hr, did not tolerate cpap, changed to bilevel and titrated to 10/6.  Needs 12/9 Pt on Bipap 12/9 based on June 2017 DL.  AHI was 1.3 and compliance is 0%.   We extensively discussed the importance of treating OSA and the need to use PAP therapy.  Encouraged pt to use bipap 2/2 his co morbidities of CAD and DM.  Continue with BiPaP 12/9.    Patient was instructed to have mask, tubings, filter, reservoir cleaned at least once a week with soapy water.  Patient was instructed to call the office if he/she is having issues with the PAP device.    I advised patient to obtain sufficient amount of sleep --  7 to 8 hours at least in a 24 hr period.  Patient was advised to follow good sleep hygiene.  Patient was advised NOT to engage in activities requiring concentration and/or vigilance if he/she is and  sleepy.  Patient is NOT to drive if he/she is sleepy.   Exertional dyspnea Likely 2/2 RVD, obesity, CAD. Non smoker.  May need PFTs, ABG if SOB worsens. He wanted to hold off.   Obesity Weight reduction      Return to clinic in 1 year.    Monica Becton, MD 02/05/2016, 2:26 PM [facility] Pager [phone] After 3 pm or if no answer, call [phone]

## 2016-02-05 NOTE — Assessment & Plan Note (Signed)
Likely 2/2 RVD, obesity, CAD. Non smoker.  May need PFTs, ABG if SOB worsens. He wanted to hold off.

## 2016-02-05 NOTE — Patient Instructions (Signed)
  It was a pleasure taking care of you today!  Continue using your BiPAP machine.   Please make sure you use your CPAP device everytime you sleep.  We will monitor the usage of your machine per your insurance requirement.  Your insurance company may take the machine from you if you are not using it regularly.   Please clean the mask, tubings, filter, water reservoir with soapy water every week.  Please use distilled water for the water reservoir.   Please call the office or your machine provider (DME company) if you are having issues with the device.   Return to clinic in 1 year

## 2016-02-05 NOTE — Assessment & Plan Note (Signed)
NPSG 2012:  AHI 21/hr Auto 2012:  Optimal pressure 13cm Intolerant of cpap.  Stopped using 2014 Split night 07/2014:  AHI 17/hr, did not tolerate cpap, changed to bilevel and titrated to 10/6.  Needs 12/9 Pt on Bipap 12/9 based on June 2017 DL.  AHI was 1.3 and compliance is 0%.   We extensively discussed the importance of treating OSA and the need to use PAP therapy.  Encouraged pt to use bipap 2/2 his co morbidities of CAD and DM.  Continue with BiPaP 12/9.    Patient was instructed to have mask, tubings, filter, reservoir cleaned at least once a week with soapy water.  Patient was instructed to call the office if he/she is having issues with the PAP device.    I advised patient to obtain sufficient amount of sleep --  7 to 8 hours at least in a 24 hr period.  Patient was advised to follow good sleep hygiene.  Patient was advised NOT to engage in activities requiring concentration and/or vigilance if he/she is and  sleepy.  Patient is NOT to drive if he/she is sleepy.

## 2016-02-08 ENCOUNTER — Other Ambulatory Visit: Payer: Self-pay | Admitting: Pulmonary Disease

## 2016-02-08 DIAGNOSIS — G4733 Obstructive sleep apnea (adult) (pediatric): Secondary | ICD-10-CM

## 2016-02-27 ENCOUNTER — Other Ambulatory Visit: Payer: Self-pay | Admitting: Internal Medicine

## 2016-03-14 ENCOUNTER — Ambulatory Visit: Payer: PPO | Admitting: Internal Medicine

## 2016-03-21 ENCOUNTER — Encounter (HOSPITAL_COMMUNITY): Payer: Self-pay | Admitting: Emergency Medicine

## 2016-03-21 ENCOUNTER — Emergency Department (HOSPITAL_COMMUNITY)
Admission: EM | Admit: 2016-03-21 | Discharge: 2016-03-21 | Disposition: A | Discharge status: Home / Self Care | Payer: PPO | Attending: Dermatology | Admitting: Dermatology

## 2016-03-21 DIAGNOSIS — I2510 Atherosclerotic heart disease of native coronary artery without angina pectoris: Secondary | ICD-10-CM | POA: Insufficient documentation

## 2016-03-21 DIAGNOSIS — Z951 Presence of aortocoronary bypass graft: Secondary | ICD-10-CM | POA: Diagnosis not present

## 2016-03-21 DIAGNOSIS — Y929 Unspecified place or not applicable: Secondary | ICD-10-CM | POA: Diagnosis not present

## 2016-03-21 DIAGNOSIS — Z7982 Long term (current) use of aspirin: Secondary | ICD-10-CM | POA: Insufficient documentation

## 2016-03-21 DIAGNOSIS — S1181XA Laceration without foreign body of other specified part of neck, initial encounter: Secondary | ICD-10-CM | POA: Diagnosis not present

## 2016-03-21 DIAGNOSIS — J45909 Unspecified asthma, uncomplicated: Secondary | ICD-10-CM | POA: Insufficient documentation

## 2016-03-21 DIAGNOSIS — Z87891 Personal history of nicotine dependence: Secondary | ICD-10-CM | POA: Diagnosis not present

## 2016-03-21 DIAGNOSIS — I10 Essential (primary) hypertension: Secondary | ICD-10-CM | POA: Insufficient documentation

## 2016-03-21 DIAGNOSIS — Z7984 Long term (current) use of oral hypoglycemic drugs: Secondary | ICD-10-CM | POA: Diagnosis not present

## 2016-03-21 DIAGNOSIS — W269XXA Contact with unspecified sharp object(s), initial encounter: Secondary | ICD-10-CM | POA: Diagnosis not present

## 2016-03-21 DIAGNOSIS — E119 Type 2 diabetes mellitus without complications: Secondary | ICD-10-CM | POA: Insufficient documentation

## 2016-03-21 DIAGNOSIS — Y939 Activity, unspecified: Secondary | ICD-10-CM | POA: Insufficient documentation

## 2016-03-21 DIAGNOSIS — Y999 Unspecified external cause status: Secondary | ICD-10-CM | POA: Diagnosis not present

## 2016-03-21 DIAGNOSIS — Z5321 Procedure and treatment not carried out due to patient leaving prior to being seen by health care provider: Secondary | ICD-10-CM | POA: Diagnosis not present

## 2016-03-21 NOTE — ED Notes (Signed)
Dressing changed, gauze applied at this time

## 2016-03-21 NOTE — ED Triage Notes (Signed)
Pt states was shaving and cut a mole on the left side of his neck, now it will not stop bleeding pt is on blood thinners and is heart bypass pt

## 2016-04-04 ENCOUNTER — Ambulatory Visit: Payer: PPO | Admitting: Internal Medicine

## 2016-04-07 ENCOUNTER — Ambulatory Visit: Payer: PPO | Admitting: Internal Medicine

## 2016-06-07 ENCOUNTER — Encounter: Payer: Self-pay | Admitting: Internal Medicine

## 2016-06-07 ENCOUNTER — Ambulatory Visit (INDEPENDENT_AMBULATORY_CARE_PROVIDER_SITE_OTHER): Payer: PPO | Admitting: Internal Medicine

## 2016-06-07 ENCOUNTER — Other Ambulatory Visit (INDEPENDENT_AMBULATORY_CARE_PROVIDER_SITE_OTHER): Payer: PPO

## 2016-06-07 VITALS — BP 130/72 | HR 62 | Temp 98.2°F | Resp 18 | Ht 68.0 in | Wt 264.0 lb

## 2016-06-07 DIAGNOSIS — E118 Type 2 diabetes mellitus with unspecified complications: Secondary | ICD-10-CM | POA: Diagnosis not present

## 2016-06-07 DIAGNOSIS — Z1159 Encounter for screening for other viral diseases: Secondary | ICD-10-CM

## 2016-06-07 DIAGNOSIS — E1169 Type 2 diabetes mellitus with other specified complication: Secondary | ICD-10-CM | POA: Diagnosis not present

## 2016-06-07 DIAGNOSIS — E669 Obesity, unspecified: Secondary | ICD-10-CM

## 2016-06-07 DIAGNOSIS — I110 Hypertensive heart disease with heart failure: Secondary | ICD-10-CM

## 2016-06-07 LAB — COMPREHENSIVE METABOLIC PANEL
ALT: 13 U/L (ref 0–53)
AST: 20 U/L (ref 0–37)
Albumin: 4 g/dL (ref 3.5–5.2)
Alkaline Phosphatase: 95 U/L (ref 39–117)
BUN: 15 mg/dL (ref 6–23)
CHLORIDE: 104 meq/L (ref 96–112)
CO2: 30 meq/L (ref 19–32)
CREATININE: 0.86 mg/dL (ref 0.40–1.50)
Calcium: 9.2 mg/dL (ref 8.4–10.5)
GFR: 93.38 mL/min (ref >60.00)
GLUCOSE: 121 mg/dL — AB (ref 70–99)
Potassium: 4.2 mEq/L (ref 3.5–5.1)
SODIUM: 141 meq/L (ref 135–145)
Total Bilirubin: 0.5 mg/dL (ref 0.2–1.2)
Total Protein: 7.4 g/dL (ref 6.0–8.3)

## 2016-06-07 LAB — HEMOGLOBIN A1C: HEMOGLOBIN A1C: 6.4 % (ref 4.6–6.5)

## 2016-06-07 NOTE — Progress Notes (Signed)
Pre visit review using our clinic review tool, if applicable. No additional management support is needed unless otherwise documented below in the visit note. 

## 2016-06-07 NOTE — Patient Instructions (Signed)
We are checking the blood work today and will call you back with the results.   Use the compression stockings to help with the fluid and you can use socks or ace wrap to keep it from digging into your skin.

## 2016-06-08 ENCOUNTER — Telehealth: Payer: Self-pay | Admitting: Cardiovascular Disease

## 2016-06-08 LAB — HEPATITIS C ANTIBODY: HCV Ab: NEGATIVE

## 2016-06-08 MED ORDER — NITROGLYCERIN 0.4 MG SL SUBL: 0.4000 mg | SUBLINGUAL_TABLET | SUBLINGUAL | 6 refills | Status: DC | PRN

## 2016-06-08 NOTE — Telephone Encounter (Signed)
New Message  Pt wife call requesting to speak with RN. Pt wife would like to speak with RN about pt getting a refill on Nitroglycerin. I am not seeing the med on med chart. Please call back to discuss    *STAT* If patient is at the pharmacy, call can be transferred to refill team.   1. Which medications need to be refilled? (please list name of each medication and dose if known)   2. Which pharmacy/location (including street and city if local pharmacy) is medication to be sent to?  3. Do they need a 30 day or 90 day supply?

## 2016-06-08 NOTE — Telephone Encounter (Signed)
Refill of NTG SL has been sent to patient's pharmacy

## 2016-06-08 NOTE — Telephone Encounter (Signed)
Does Dr. Acie Fredrickson want to add Nitro to his med list? Please advise

## 2016-06-09 NOTE — Assessment & Plan Note (Addendum)
BP at goal today on his lasix and losartan. Checking CMP and adjust as needed. Rx for compression stockings for the fluid in his legs.

## 2016-06-09 NOTE — Progress Notes (Signed)
   Subjective:    Patient ID: [name], male    DOB: [birth year], 70 y.o.   MRN: [id number]  HPI The patient is a 70 YO man coming in for follow up of his medical problems including his sugars (taking metformin and ARB, no side effects, complicated), his blood pressure (taking lasix, losartan and no side effects). No new concerns. Some chronic leg swelling which is unchanged.   Review of Systems  Constitutional: Negative for activity change, appetite change, fatigue, fever and unexpected weight change.  Respiratory: Negative.   Cardiovascular: Positive for leg swelling. Negative for chest pain and palpitations.  Gastrointestinal: Negative.   Musculoskeletal: Negative.   Skin: Negative.   Neurological: Negative.   Psychiatric/Behavioral: Negative.       Objective:   Physical Exam  Constitutional: He is oriented to person, place, and time. He appears well-developed and well-nourished.  Overweight  HENT:  Head: Normocephalic and atraumatic.  Eyes: EOM are normal.  Neck: Normal range of motion.  Cardiovascular: Normal rate and regular rhythm.   Pulmonary/Chest: Effort normal. No respiratory distress. He has no wheezes. He has no rales.  Abdominal: Soft. Bowel sounds are normal. He exhibits no distension. There is no tenderness. There is no rebound.  Musculoskeletal: He exhibits edema.  Stable chronic edema bilateral  Neurological: He is alert and oriented to person, place, and time. Coordination normal.  Skin: Skin is warm and dry.   Vitals:   06/07/16 1523  BP: 130/72  Pulse: 62  Resp: 18  Temp: 98.2 F (36.8 C)  TempSrc: Oral  SpO2: 96%  Weight: 264 lb (119.7 kg)  Height: '5\' 8"'$  (1.727 m)      Assessment & Plan:

## 2016-06-09 NOTE — Assessment & Plan Note (Signed)
Talked to him about the need for weight loss to help his health conditions and he will think about it.

## 2016-06-09 NOTE — Assessment & Plan Note (Signed)
Taking metformin and checking HgA1c and CMP today to see if we need change. Foot exam up to date. Complicated by neuropathy which is stable.

## 2016-07-07 ENCOUNTER — Other Ambulatory Visit: Payer: Self-pay | Admitting: Cardiovascular Disease

## 2016-07-08 ENCOUNTER — Other Ambulatory Visit: Payer: Self-pay | Admitting: Cardiovascular Disease

## 2016-09-07 ENCOUNTER — Other Ambulatory Visit: Payer: Self-pay | Admitting: Cardiovascular Disease

## 2017-02-15 ENCOUNTER — Emergency Department (HOSPITAL_COMMUNITY)
Admission: EM | Admit: 2017-02-15 | Discharge: 2017-02-15 | Disposition: A | Discharge status: Home / Self Care | Payer: PPO | Attending: Emergency Medicine | Admitting: Emergency Medicine

## 2017-02-15 ENCOUNTER — Encounter (HOSPITAL_COMMUNITY): Payer: Self-pay | Admitting: Emergency Medicine

## 2017-02-15 DIAGNOSIS — X58XXXA Exposure to other specified factors, initial encounter: Secondary | ICD-10-CM | POA: Insufficient documentation

## 2017-02-15 DIAGNOSIS — Z9114 Patient's other noncompliance with medication regimen: Secondary | ICD-10-CM

## 2017-02-15 DIAGNOSIS — Z87891 Personal history of nicotine dependence: Secondary | ICD-10-CM | POA: Diagnosis not present

## 2017-02-15 DIAGNOSIS — Z7984 Long term (current) use of oral hypoglycemic drugs: Secondary | ICD-10-CM | POA: Diagnosis not present

## 2017-02-15 DIAGNOSIS — E119 Type 2 diabetes mellitus without complications: Secondary | ICD-10-CM | POA: Insufficient documentation

## 2017-02-15 DIAGNOSIS — I10 Essential (primary) hypertension: Secondary | ICD-10-CM | POA: Diagnosis not present

## 2017-02-15 DIAGNOSIS — Y939 Activity, unspecified: Secondary | ICD-10-CM | POA: Diagnosis not present

## 2017-02-15 DIAGNOSIS — Z955 Presence of coronary angioplasty implant and graft: Secondary | ICD-10-CM | POA: Diagnosis not present

## 2017-02-15 DIAGNOSIS — M79661 Pain in right lower leg: Secondary | ICD-10-CM | POA: Diagnosis not present

## 2017-02-15 DIAGNOSIS — Z951 Presence of aortocoronary bypass graft: Secondary | ICD-10-CM | POA: Diagnosis not present

## 2017-02-15 DIAGNOSIS — J45909 Unspecified asthma, uncomplicated: Secondary | ICD-10-CM | POA: Diagnosis not present

## 2017-02-15 DIAGNOSIS — Y999 Unspecified external cause status: Secondary | ICD-10-CM | POA: Diagnosis not present

## 2017-02-15 DIAGNOSIS — S80821A Blister (nonthermal), right lower leg, initial encounter: Secondary | ICD-10-CM | POA: Diagnosis not present

## 2017-02-15 DIAGNOSIS — Z79899 Other long term (current) drug therapy: Secondary | ICD-10-CM | POA: Insufficient documentation

## 2017-02-15 DIAGNOSIS — Y929 Unspecified place or not applicable: Secondary | ICD-10-CM | POA: Insufficient documentation

## 2017-02-15 DIAGNOSIS — I2510 Atherosclerotic heart disease of native coronary artery without angina pectoris: Secondary | ICD-10-CM | POA: Insufficient documentation

## 2017-02-15 DIAGNOSIS — S81851A Open bite, right lower leg, initial encounter: Secondary | ICD-10-CM | POA: Diagnosis not present

## 2017-02-15 LAB — CBC
HCT: 37.6 % — ABNORMAL LOW (ref 39.0–52.0)
Hemoglobin: 12.1 g/dL — ABNORMAL LOW (ref 13.0–17.0)
MCH: 27.2 pg (ref 26.0–34.0)
MCHC: 32.2 g/dL (ref 30.0–36.0)
MCV: 84.5 fL (ref 78.0–100.0)
PLATELETS: 128 10*3/uL — AB (ref 150–400)
RBC: 4.45 MIL/uL (ref 4.22–5.81)
RDW: 14.8 % (ref 11.5–15.5)
WBC: 5.9 10*3/uL (ref 4.0–10.5)

## 2017-02-15 LAB — BASIC METABOLIC PANEL
Anion gap: 8 (ref 5–15)
BUN: 14 mg/dL (ref 6–20)
CHLORIDE: 106 mmol/L (ref 101–111)
CO2: 26 mmol/L (ref 22–32)
CREATININE: 0.91 mg/dL (ref 0.61–1.24)
Calcium: 9.3 mg/dL (ref 8.9–10.3)
GFR calc Af Amer: >60 mL/min (ref >60)
GFR calc non Af Amer: >60 mL/min (ref >60)
Glucose, Bld: 110 mg/dL — ABNORMAL HIGH (ref 65–99)
Potassium: 4.1 mmol/L (ref 3.5–5.1)
Sodium: 140 mmol/L (ref 135–145)

## 2017-02-15 LAB — CBG MONITORING, ED: GLUCOSE-CAPILLARY: 99 mg/dL (ref 65–99)

## 2017-02-15 MED ORDER — ATORVASTATIN CALCIUM 40 MG PO TABS: ORAL_TABLET | ORAL | 0 refills | Status: DC

## 2017-02-15 MED ORDER — METFORMIN HCL 500 MG PO TABS: 500.0000 mg | ORAL_TABLET | Freq: Two times a day (BID) | ORAL | 0 refills | Status: DC

## 2017-02-15 MED ORDER — POTASSIUM CHLORIDE CRYS ER 20 MEQ PO TBCR: 20.0000 meq | EXTENDED_RELEASE_TABLET | Freq: Every day | ORAL | 0 refills | Status: DC

## 2017-02-15 MED ORDER — LOSARTAN POTASSIUM 50 MG PO TABS: 50.0000 mg | ORAL_TABLET | Freq: Every day | ORAL | 0 refills | Status: DC

## 2017-02-15 MED ORDER — GABAPENTIN 100 MG PO CAPS: 100.0000 mg | ORAL_CAPSULE | Freq: Two times a day (BID) | ORAL | 0 refills | Status: DC

## 2017-02-15 MED ORDER — FUROSEMIDE 40 MG PO TABS
40.0000 mg | ORAL_TABLET | Freq: Two times a day (BID) | ORAL | 0 refills | Status: DC
Start: 2017-02-15 — End: 2017-02-20

## 2017-02-15 MED ORDER — OXYCODONE-ACETAMINOPHEN 5-325 MG PO TABS
1.0000 | ORAL_TABLET | Freq: Once | ORAL | Status: AC
  Administered 2017-02-15: 1 via ORAL
  Filled 2017-02-15: qty 1

## 2017-02-15 MED ORDER — TRAMADOL-ACETAMINOPHEN 37.5-325 MG PO TABS: 1.0000 | ORAL_TABLET | Freq: Four times a day (QID) | ORAL | 0 refills | Status: DC | PRN

## 2017-02-15 NOTE — ED Triage Notes (Signed)
Pt comes in with a small ulcer noted to the right inner calf.  Pt states it has been there for a bout a week but has gotten worse.  Endorses being diabetic. Normally takes metformin but has been out of it because he needs to set up an appointment with his PCP and he hasn't yet. Pt ambulatory.  Edema also noted to legs bilaterally.  Pt states he takes a fluid pill and has been taking those.

## 2017-02-15 NOTE — ED Provider Notes (Signed)
[facility] DEPT Provider Note   CSN: [id number] Arrival date & time: 02/15/17  1700     History   Chief Complaint Chief Complaint  Patient presents with  . Leg Ulcer    HPI [name] is a 71 y.o. male.  Patient with a history of CAD, DM, cirrhosis, HTN, HLD, CHF presents with painful area to right lower extremity x 1 week. He originally thought his boot had rubbed the are to the point of ulceration but symptoms have gotten progressively worse through the week. No calf pain, fever, nausea. He denies drainage from the wound.    The history is provided by the patient and a relative. No language interpreter was used.    Past Medical History:  Diagnosis Date  . Arthritis    "joints tighten up sometimes" (03/27/2104)  . Asthma   . Chronic low back pain   . Chronic lower back pain   . Coronary artery disease    a. 05/2012 Cath/PCI: LM 40ost, 50-60d, LAD 99p ruptured plaque (3.0x28 DES), LCX 50p/m, RCA 30-40p, 1m, EF 65-70%.  Marland Kitchen GERD (gastroesophageal reflux disease)   . Hepatic cirrhosis ([place])    a. Dx 01/2014 - CT a/p   . History of blood transfusion    "related to bleeding ulcers"  . History of concussion    1976--  NO RESIDUAL  . History of GI bleed    a. UGIB 07/2012;  b. 01/2014 admission with GIB/FOB stool req 1U prbc's->EGD showed portal gastropathy, barrett's esoph, and chronic active h. pylori gastritis.  Marland Kitchen History of gout    2007 &  2008  LEFT LEG-- NO ISSUE SINCE  . Hyperlipidemia   . Iron deficiency anemia   . Kidney stones   . OA (osteoarthritis of spine)    LOWER BACK--  INTERMITTANT LEFT LEG NUMBNESS  . OSA (obstructive sleep apnea)    PULMOLOGIST-  DR CLANCE--  MODERATE OSA  STARTED CPAP 2012--  BUT CURRENTLY HAS NOT USED PAST 6 MONTHS  . Phimosis    a. s/p circumcision 2015.  . Type 2 diabetes mellitus ([place])   . Unspecified essential hypertension     Patient Active Problem List   Diagnosis Date Noted  . Morbid obesity ([place]) 02/05/2016  .  Numerous moles 09/15/2015  . S/P CABG x 3 03/31/2014  . History of GI bleed   . Unstable angina ([place]) 03/27/2014  . Cirrhosis of liver without mention of alcohol 02/27/2014  . Hyperlipidemia with target LDL less than 70 06/29/2012  . Coronary artery disease    . OSA (obstructive sleep apnea)   . Type 2 diabetes with complication ([place]) 93/81/0175  . GERD 08/04/2010  . History of gout   . Hypertensive heart disease     Past Surgical History:  Procedure Laterality Date  . ANKLE FRACTURE SURGERY Right 1989   "plate put in"  . APPENDECTOMY  05-16-2004   open  . CIRCUMCISION N/A 09/09/2013   Procedure: CIRCUMCISION ADULT;  Surgeon: Bernestine Amass, MD;  Location: [facility];  Service: Urology;  Laterality: N/A;  . COLECTOMY  05-16-2004  . CORONARY ANGIOPLASTY WITH STENT PLACEMENT  06/28/2012  DR COOPER   PCI W/  X1 DES to [place]. LAD/  LM  40% OSTIAL & 50-60% DISTAL /  50% PROX LCX/  30-40% PROX RCA & 50% MID RCA/   LVEF 65-70%  . CORONARY ARTERY BYPASS GRAFT N/A 03/31/2014   Procedure: CORONARY ARTERY BYPASS GRAFTING (CABG) times 3 using  left internal mammary artery and right saphenous vein.;  Surgeon: Melrose Nakayama, MD;  Location: [facility];  Service: Open Heart Surgery;  Laterality: N/A;  . DOBUTAMINE STRESS ECHO  06-08-2012   MODERATE HYPOKINESIS/ ISCHEMIA MID INFERIOR WALL  . ESOPHAGOGASTRODUODENOSCOPY  08/15/2012   Procedure: ESOPHAGOGASTRODUODENOSCOPY (EGD);  Surgeon: Wonda Horner, MD;  Location[facility] ENDOSCOPY;  Service: Endoscopy;  Laterality: N/A;  . ESOPHAGOGASTRODUODENOSCOPY N/A 02/17/2014   Procedure: ESOPHAGOGASTRODUODENOSCOPY (EGD);  Surgeon: Jeryl Columbia, MD;  Location: [facility] ENDOSCOPY;  Service: Endoscopy;  Laterality: N/A;  . HERNIA REPAIR    . INTRAOPERATIVE TRANSESOPHAGEAL ECHOCARDIOGRAM N/A 03/31/2014   Procedure: INTRAOPERATIVE TRANSESOPHAGEAL ECHOCARDIOGRAM;  Surgeon: Melrose Nakayama, MD;  Location: [facility];  Service: Open Heart Surgery;  Laterality: N/A;    . LAPAROSCOPIC UMBILICAL HERNIA REPAIR W/ MESH  06-06-2011  . LEFT HEART CATHETERIZATION WITH CORONARY ANGIOGRAM N/A 03/28/2014   Procedure: LEFT HEART CATHETERIZATION WITH CORONARY ANGIOGRAM;  Surgeon: Sinclair Grooms, MD;  Location: [facility] CATH LAB;  Service: Cardiovascular;  Laterality: N/A;  . NEPHROLITHOTOMY  1990'S  . OPEN APPENDECTOMY W/ PARTIAL CECECTOMY  05-16-2004  . PERCUTANEOUS CORONARY STENT INTERVENTION (PCI-S) N/A 06/28/2012   Procedure: PERCUTANEOUS CORONARY STENT INTERVENTION (PCI-S);  Surgeon: Sherren Mocha, MD;  Location: [facility] CATH LAB;  Service: Cardiovascular;  Laterality: N/A;       Home Medications    Prior to Admission medications   Medication Sig Start Date End Date Taking? Authorizing Provider  aspirin EC 81 MG tablet Take 1 tablet (81 mg total) by mouth 2 (two) times daily. 06/09/15  Yes Nahser, Wonda Cheng, MD  atorvastatin (LIPITOR) 40 MG tablet TAKE ONE TABLET BY MOUTH ONCE DAILY AT 6 PM. 07/08/16  Yes Nahser, Wonda Cheng, MD  ferrous sulfate 325 (65 FE) MG tablet Take 325 mg by mouth 2 (two) times daily with a meal.    Yes [provider]  furosemide (LASIX) 40 MG tablet TAKE ONE TABLET BY MOUTH TWICE DAILY 07/07/16  Yes Nahser, Wonda Cheng, MD  gabapentin (NEURONTIN) 100 MG capsule Take 1 capsule (100 mg total) by mouth 2 (two) times daily. 09/28/15  Yes [name], MD  losartan (COZAAR) 50 MG tablet TAKE ONE TABLET BY MOUTH  DAILY 09/07/16  Yes Nahser, Wonda Cheng, MD  metFORMIN (GLUCOPHAGE) 500 MG tablet TAKE ONE TABLET BY MOUTH TWICE DAILY WITH A MEAL 02/29/16  Yes [name], MD  nitroGLYCERIN (NITROSTAT) 0.4 MG SL tablet Place 1 tablet (0.4 mg total) under the tongue every 5 (five) minutes as needed for chest pain. 06/08/16 06/08/17 Yes Nahser, Wonda Cheng, MD  potassium chloride SA (K-DUR,KLOR-CON) 20 MEQ tablet Take 20 mEq by mouth daily.   Yes [provider]  ranitidine (ZANTAC) 150 MG capsule Take 150 mg by mouth 2 (two) times  daily.   Yes [provider]    Family History Family History  Problem Relation Age of Onset  . Lung cancer Sister   . Cancer Sister        lung  . Cancer Mother   . Cancer Father        died in his 93s.  . Cancer Brother        lung  . Coronary artery disease Unknown   . Diabetes Unknown   . Colon cancer Unknown   . Cancer Sister        lung    Social History Social History  Substance Use Topics  . Smoking status: Former Smoker  Packs/day: 2.00    Years: 40.00    Types: Cigarettes    Quit date: 08/29/1996  . Smokeless tobacco: Never Used  . Alcohol use 0.0 oz/week     Comment: 03/27/2014  still drinks rum; a few glasses per week; and beer sometime; states he does not drink like he did      Allergies   Patient has no known allergies.   Review of Systems Review of Systems  Constitutional: Negative for fever.  Respiratory: Negative for shortness of breath.   Cardiovascular: Negative for chest pain.  Gastrointestinal: Negative for nausea.  Musculoskeletal:       See HPI.  Skin: Positive for wound.  Neurological: Negative for weakness and numbness.     Physical Exam Updated Vital Signs BP (!) 188/77 (BP Location: Right Arm)   Pulse 66   Temp 98.3 F (36.8 C) (Oral)   Resp 12   Ht 5\' 6"  (1.676 m)   Wt 104 kg (229 lb 4.8 oz)   SpO2 98%   BMI 37.01 kg/m   Physical Exam  Constitutional: He appears well-developed and well-nourished. No distress.  Cardiovascular: Intact distal pulses.   Musculoskeletal: He exhibits edema.  Tenderness localized to blister and surrounding erythema. No induration. FROM RLE. No calf tenderness.   Skin:  Small open blister to right lower leg with surrounding ecchymosis. No drainage. No pus collection.       ED Treatments / Results  Labs (all labs ordered are listed, but only abnormal results are displayed) Labs Reviewed  BASIC METABOLIC PANEL - Abnormal; Notable for the following:       Result Value    Glucose, Bld 110 (*)    All other components within normal limits  CBC - Abnormal; Notable for the following:    Hemoglobin 12.1 (*)    HCT 37.6 (*)    Platelets 128 (*)    All other components within normal limits  URINALYSIS, ROUTINE W REFLEX MICROSCOPIC  CBG MONITORING, ED    EKG  EKG Interpretation None       Radiology No results found.  Procedures Procedures (including critical care time)  Medications Ordered in ED Medications - No data to display   Initial Impression / Assessment and Plan / ED Course  I have reviewed the triage vital signs and the nursing notes.  Pertinent labs & imaging results that were available during my care of the patient were reviewed by me and considered in my medical decision making (see chart for details).     Patient presents with right lower leg sore x 1 week. Suspect some type of bite wound. Doubt vascular source or infection.   He is also out of his medications for blood pressure and diabetes. CBG 99, blood pressure 188/77. No chest pain, SOB.   Will refill medications for one week. He is strongly encouraged to see his doctor this week for routine exam and regular refill of all medications.  Final Clinical Impressions(s) / ED Diagnoses   Final diagnoses:  None   1. Right LE wound 2. HTN 3. Medication noncompliance  New Prescriptions New Prescriptions   No medications on file     Dennie Bible 02/15/17 2309    Mesner, Corene Cornea, MD 02/16/17 [phone]

## 2017-02-20 ENCOUNTER — Encounter: Payer: Self-pay | Admitting: Nurse Practitioner

## 2017-02-20 ENCOUNTER — Ambulatory Visit (INDEPENDENT_AMBULATORY_CARE_PROVIDER_SITE_OTHER): Payer: PPO | Admitting: Nurse Practitioner

## 2017-02-20 VITALS — BP 124/60 | HR 50 | Temp 98.5°F | Ht 66.0 in | Wt 238.0 lb

## 2017-02-20 DIAGNOSIS — E785 Hyperlipidemia, unspecified: Secondary | ICD-10-CM | POA: Diagnosis not present

## 2017-02-20 DIAGNOSIS — I110 Hypertensive heart disease with heart failure: Secondary | ICD-10-CM | POA: Diagnosis not present

## 2017-02-20 DIAGNOSIS — L03115 Cellulitis of right lower limb: Secondary | ICD-10-CM | POA: Diagnosis not present

## 2017-02-20 DIAGNOSIS — E118 Type 2 diabetes mellitus with unspecified complications: Secondary | ICD-10-CM

## 2017-02-20 DIAGNOSIS — R600 Localized edema: Secondary | ICD-10-CM

## 2017-02-20 MED ORDER — POTASSIUM CHLORIDE CRYS ER 20 MEQ PO TBCR: 20.0000 meq | EXTENDED_RELEASE_TABLET | Freq: Every day | ORAL | 2 refills | Status: DC

## 2017-02-20 MED ORDER — FUROSEMIDE 40 MG PO TABS: 40.0000 mg | ORAL_TABLET | Freq: Two times a day (BID) | ORAL | 2 refills | Status: DC

## 2017-02-20 MED ORDER — GABAPENTIN 100 MG PO CAPS: 100.0000 mg | ORAL_CAPSULE | Freq: Two times a day (BID) | ORAL | 2 refills | Status: DC

## 2017-02-20 MED ORDER — METFORMIN HCL 500 MG PO TABS: 500.0000 mg | ORAL_TABLET | Freq: Two times a day (BID) | ORAL | 2 refills | Status: DC

## 2017-02-20 MED ORDER — CEFTRIAXONE SODIUM 1 G IJ SOLR
1.0000 g | Freq: Once | INTRAMUSCULAR | Status: AC
  Administered 2017-02-20: 1 g via INTRAMUSCULAR

## 2017-02-20 MED ORDER — SULFAMETHOXAZOLE-TRIMETHOPRIM 800-160 MG PO TABS: 1.0000 | ORAL_TABLET | Freq: Two times a day (BID) | ORAL | 0 refills | Status: DC

## 2017-02-20 MED ORDER — DOXYCYCLINE HYCLATE 100 MG PO TABS: 100.0000 mg | ORAL_TABLET | Freq: Two times a day (BID) | ORAL | 0 refills | Status: DC

## 2017-02-20 MED ORDER — LOSARTAN POTASSIUM 50 MG PO TABS: 50.0000 mg | ORAL_TABLET | Freq: Every day | ORAL | 2 refills | Status: DC

## 2017-02-20 MED ORDER — ATORVASTATIN CALCIUM 40 MG PO TABS: ORAL_TABLET | ORAL | 2 refills | Status: DC

## 2017-02-20 NOTE — Patient Instructions (Signed)

## 2017-02-20 NOTE — Progress Notes (Signed)
Subjective:  Patient ID: [name], male    DOB: 01-01-1946  Age: 71 y.o. MRN: [id number]  CC: Insect Bite (right leg burning,red,swelling,discoloration/ went to hospital 5 days ago--follow up with PCP/// nose bleed at times?)   Wound Check  He was originally treated 5 to 10 days ago. His temperature was unmeasured prior to arrival. There has been no drainage from the wound. The redness has worsened. The swelling has worsened. The pain has worsened.  limping gait due to leg swelling and pain. Unsure if wound is due to bite. No improvement with swelling.  Also need medications refilled. Has been out of medications for over 50month.  Outpatient Medications Prior to Visit  Medication Sig Dispense Refill  . aspirin EC 81 MG tablet Take 1 tablet (81 mg total) by mouth 2 (two) times daily.    . ferrous sulfate 325 (65 FE) MG tablet Take 325 mg by mouth 2 (two) times daily with a meal.     . nitroGLYCERIN (NITROSTAT) 0.4 MG SL tablet Place 1 tablet (0.4 mg total) under the tongue every 5 (five) minutes as needed for chest pain. 25 tablet 6  . ranitidine (ZANTAC) 150 MG capsule Take 150 mg by mouth 2 (two) times daily.    . traMADol-acetaminophen (ULTRACET) 37.5-325 MG tablet Take 1 tablet by mouth every 6 (six) hours as needed. 10 tablet 0  . atorvastatin (LIPITOR) 40 MG tablet TAKE ONE TABLET BY MOUTH ONCE DAILY AT 6 PM. 7 tablet 0  . furosemide (LASIX) 40 MG tablet Take 1 tablet (40 mg total) by mouth 2 (two) times daily. 14 tablet 0  . gabapentin (NEURONTIN) 100 MG capsule Take 1 capsule (100 mg total) by mouth 2 (two) times daily. 14 capsule 0  . losartan (COZAAR) 50 MG tablet Take 1 tablet (50 mg total) by mouth daily. 7 tablet 0  . metFORMIN (GLUCOPHAGE) 500 MG tablet Take 1 tablet (500 mg total) by mouth 2 (two) times daily with a meal. 14 tablet 0  . potassium chloride SA (K-DUR,KLOR-CON) 20 MEQ tablet Take 1 tablet (20 mEq total) by mouth daily. 7 tablet 0   No  facility-administered medications prior to visit.     ROS See HPI  Objective:  BP 124/60   Pulse (!) 50   Temp 98.5 F (36.9 C)   Ht 5\' 6"  (1.676 m)   Wt 238 lb (108 kg)   SpO2 98%   BMI 38.41 kg/m   BP Readings from Last 3 Encounters:  02/20/17 124/60  02/15/17 (!) 176/75  06/07/16 130/72    Wt Readings from Last 3 Encounters:  02/20/17 238 lb (108 kg)  02/15/17 229 lb 4.8 oz (104 kg)  06/07/16 264 lb (119.7 kg)    Physical Exam  Constitutional: He is oriented to person, place, and time. No distress.  Cardiovascular: Normal rate.   Pulmonary/Chest: Effort normal.  Abdominal: Soft.  Musculoskeletal: He exhibits edema and tenderness.  Right LE redness and swelling, increased warmth and tender to touch.  Neurological: He is alert and oriented to person, place, and time.  Skin: Skin is warm and dry. Rash noted. There is erythema.  Vitals reviewed.   Lab Results  Component Value Date   WBC 5.9 02/15/2017   HGB 12.1 (L) 02/15/2017   HCT 37.6 (L) 02/15/2017   PLT 128 (L) 02/15/2017   GLUCOSE 110 (H) 02/15/2017   CHOL 92 (L) 06/09/2015   TRIG 119 06/09/2015   HDL 28 (L) 06/09/2015  [place] 40 06/09/2015   ALT 13 06/07/2016   AST 20 06/07/2016   NA 140 02/15/2017   K 4.1 02/15/2017   CL 106 02/15/2017   CREATININE 0.91 02/15/2017   BUN 14 02/15/2017   CO2 26 02/15/2017   TSH 1.86 09/15/2015   INR 1.46 03/31/2014   HGBA1C 6.4 06/07/2016   MICROALBUR 0.8 03/13/2013    No results found.  Assessment & Plan:   [name] was seen today for insect bite.  Diagnoses and all orders for this visit:  Cellulitis of right lower extremity -     cefTRIAXone (ROCEPHIN) injection 1 g; Inject 1 g into the muscle once. -     Discontinue: doxycycline (VIBRA-TABS) 100 MG tablet; Take 1 tablet (100 mg total) by mouth 2 (two) times daily. -     VAS [place] LOWER EXTREMITY VENOUS (DVT); Future -     sulfamethoxazole-trimethoprim (BACTRIM DS,SEPTRA DS) 800-160 MG tablet; Take 1  tablet by mouth 2 (two) times daily. With food  Localized edema -     cefTRIAXone (ROCEPHIN) injection 1 g; Inject 1 g into the muscle once. -     Discontinue: doxycycline (VIBRA-TABS) 100 MG tablet; Take 1 tablet (100 mg total) by mouth 2 (two) times daily. -     VAS [place] LOWER EXTREMITY VENOUS (DVT); Future -     Discontinue: furosemide (LASIX) 40 MG tablet; Take 1 tablet (40 mg total) by mouth 2 (two) times daily. -     Discontinue: potassium chloride SA (K-DUR,KLOR-CON) 20 MEQ tablet; Take 1 tablet (20 mEq total) by mouth daily. -     furosemide (LASIX) 40 MG tablet; Take 1 tablet (40 mg total) by mouth 2 (two) times daily. -     potassium chloride SA (K-DUR,KLOR-CON) 20 MEQ tablet; Take 1 tablet (20 mEq total) by mouth daily.  Type 2 diabetes mellitus with complication, without long-term current use of insulin (HCC) -     Discontinue: gabapentin (NEURONTIN) 100 MG capsule; Take 1 capsule (100 mg total) by mouth 2 (two) times daily. -     Discontinue: losartan (COZAAR) 50 MG tablet; Take 1 tablet (50 mg total) by mouth daily. -     Discontinue: metFORMIN (GLUCOPHAGE) 500 MG tablet; Take 1 tablet (500 mg total) by mouth 2 (two) times daily with a meal. -     gabapentin (NEURONTIN) 100 MG capsule; Take 1 capsule (100 mg total) by mouth 2 (two) times daily. -     losartan (COZAAR) 50 MG tablet; Take 1 tablet (50 mg total) by mouth daily. -     metFORMIN (GLUCOPHAGE) 500 MG tablet; Take 1 tablet (500 mg total) by mouth 2 (two) times daily with a meal.  Hyperlipidemia with target LDL less than 70 -     atorvastatin (LIPITOR) 40 MG tablet; TAKE ONE TABLET BY MOUTH ONCE DAILY AT 6 PM.  Hypertensive heart disease with congestive heart failure, unspecified heart failure type (HCC) -     Discontinue: furosemide (LASIX) 40 MG tablet; Take 1 tablet (40 mg total) by mouth 2 (two) times daily. -     Discontinue: losartan (COZAAR) 50 MG tablet; Take 1 tablet (50 mg total) by mouth daily. -      Discontinue: potassium chloride SA (K-DUR,KLOR-CON) 20 MEQ tablet; Take 1 tablet (20 mEq total) by mouth daily. -     furosemide (LASIX) 40 MG tablet; Take 1 tablet (40 mg total) by mouth 2 (two) times daily. -     losartan (COZAAR)  50 MG tablet; Take 1 tablet (50 mg total) by mouth daily. -     potassium chloride SA (K-DUR,KLOR-CON) 20 MEQ tablet; Take 1 tablet (20 mEq total) by mouth daily.   I have discontinued Mr. Wafer's doxycycline. I am also having him start on sulfamethoxazole-trimethoprim. Additionally, I am having him maintain his ferrous sulfate, ranitidine, aspirin EC, nitroGLYCERIN, traMADol-acetaminophen, atorvastatin, furosemide, gabapentin, losartan, metFORMIN, and potassium chloride SA. We administered cefTRIAXone.  Meds ordered this encounter  Medications  . cefTRIAXone (ROCEPHIN) injection 1 g  . DISCONTD: doxycycline (VIBRA-TABS) 100 MG tablet    Sig: Take 1 tablet (100 mg total) by mouth 2 (two) times daily.    Dispense:  14 tablet    Refill:  0    Order Specific Question:   Supervising Provider    Answer:   Cassandria Anger [[id number]]  . atorvastatin (LIPITOR) 40 MG tablet    Sig: TAKE ONE TABLET BY MOUTH ONCE DAILY AT 6 PM.    Dispense:  30 tablet    Refill:  2    Order Specific Question:   Supervising Provider    Answer:   Cassandria Anger [[id number]]  . DISCONTD: furosemide (LASIX) 40 MG tablet    Sig: Take 1 tablet (40 mg total) by mouth 2 (two) times daily.    Dispense:  60 tablet    Refill:  2    Order Specific Question:   Supervising Provider    Answer:   Cassandria Anger [[id number]]  . DISCONTD: gabapentin (NEURONTIN) 100 MG capsule    Sig: Take 1 capsule (100 mg total) by mouth 2 (two) times daily.    Dispense:  60 capsule    Refill:  2    Order Specific Question:   Supervising Provider    Answer:   Cassandria Anger [[id number]]  . DISCONTD: losartan (COZAAR) 50 MG tablet    Sig: Take 1 tablet (50 mg total) by mouth daily.    Dispense:  30 tablet     Refill:  2    Order Specific Question:   Supervising Provider    Answer:   Cassandria Anger [[id number]]  . DISCONTD: metFORMIN (GLUCOPHAGE) 500 MG tablet    Sig: Take 1 tablet (500 mg total) by mouth 2 (two) times daily with a meal.    Dispense:  60 tablet    Refill:  2    Order Specific Question:   Supervising Provider    Answer:   Cassandria Anger [[id number]]  . DISCONTD: potassium chloride SA (K-DUR,KLOR-CON) 20 MEQ tablet    Sig: Take 1 tablet (20 mEq total) by mouth daily.    Dispense:  30 tablet    Refill:  2    Order Specific Question:   Supervising Provider    Answer:   Cassandria Anger [[id number]]  . furosemide (LASIX) 40 MG tablet    Sig: Take 1 tablet (40 mg total) by mouth 2 (two) times daily.    Dispense:  60 tablet    Refill:  2    Order Specific Question:   Supervising Provider    Answer:   Cassandria Anger [[id number]]  . gabapentin (NEURONTIN) 100 MG capsule    Sig: Take 1 capsule (100 mg total) by mouth 2 (two) times daily.    Dispense:  60 capsule    Refill:  2    Order Specific Question:   Supervising Provider    Answer:   Cassandria Anger [[id number]]  . losartan (  COZAAR) 50 MG tablet    Sig: Take 1 tablet (50 mg total) by mouth daily.    Dispense:  30 tablet    Refill:  2    Order Specific Question:   Supervising Provider    Answer:   Cassandria Anger [[id number]]  . metFORMIN (GLUCOPHAGE) 500 MG tablet    Sig: Take 1 tablet (500 mg total) by mouth 2 (two) times daily with a meal.    Dispense:  60 tablet    Refill:  2    Order Specific Question:   Supervising Provider    Answer:   Cassandria Anger [[id number]]  . potassium chloride SA (K-DUR,KLOR-CON) 20 MEQ tablet    Sig: Take 1 tablet (20 mEq total) by mouth daily.    Dispense:  30 tablet    Refill:  2    Order Specific Question:   Supervising Provider    Answer:   Cassandria Anger [[id number]]  . sulfamethoxazole-trimethoprim (BACTRIM DS,SEPTRA DS) 800-160 MG tablet    Sig: Take 1 tablet by mouth 2 (two)  times daily. With food    Dispense:  14 tablet    Refill:  0    Order Specific Question:   Supervising Provider    Answer:   Cassandria Anger [[id number]]    Follow-up: Return in about 1 week (around 02/27/2017) for right leg cellulitis.Wilfred Lacy, NP

## 2017-02-22 ENCOUNTER — Ambulatory Visit (HOSPITAL_COMMUNITY): Payer: PPO | Attending: Internal Medicine

## 2017-03-02 ENCOUNTER — Encounter: Payer: Self-pay | Admitting: Nurse Practitioner

## 2017-03-02 ENCOUNTER — Ambulatory Visit (INDEPENDENT_AMBULATORY_CARE_PROVIDER_SITE_OTHER): Payer: PPO | Admitting: Nurse Practitioner

## 2017-03-02 VITALS — BP 136/60 | HR 64 | Temp 98.5°F | Ht 66.0 in | Wt 242.0 lb

## 2017-03-02 DIAGNOSIS — L03115 Cellulitis of right lower limb: Secondary | ICD-10-CM

## 2017-03-02 DIAGNOSIS — L819 Disorder of pigmentation, unspecified: Secondary | ICD-10-CM | POA: Diagnosis not present

## 2017-03-02 NOTE — Progress Notes (Signed)
Subjective:  Patient ID: [name], male    DOB: 09/06/1945  Age: 71 y.o. MRN: [id number]  CC: Follow-up (follow up/ legs little better)   HPI  Cellulitis follow up: Completed oral abx as prescribed. Report resolved redness and pain in right LE. Has persistent LE edema. Does not follow low salt diet. Has persistent LE nodule (present for several years). Did not follow up with dermatology as previously recommended over 3years ago. He will like another referral.  Outpatient Medications Prior to Visit  Medication Sig Dispense Refill  . aspirin EC 81 MG tablet Take 1 tablet (81 mg total) by mouth 2 (two) times daily.    Marland Kitchen atorvastatin (LIPITOR) 40 MG tablet TAKE ONE TABLET BY MOUTH ONCE DAILY AT 6 PM. 30 tablet 2  . ferrous sulfate 325 (65 FE) MG tablet Take 325 mg by mouth 2 (two) times daily with a meal.     . furosemide (LASIX) 40 MG tablet Take 1 tablet (40 mg total) by mouth 2 (two) times daily. 60 tablet 2  . gabapentin (NEURONTIN) 100 MG capsule Take 1 capsule (100 mg total) by mouth 2 (two) times daily. 60 capsule 2  . losartan (COZAAR) 50 MG tablet Take 1 tablet (50 mg total) by mouth daily. 30 tablet 2  . metFORMIN (GLUCOPHAGE) 500 MG tablet Take 1 tablet (500 mg total) by mouth 2 (two) times daily with a meal. 60 tablet 2  . nitroGLYCERIN (NITROSTAT) 0.4 MG SL tablet Place 1 tablet (0.4 mg total) under the tongue every 5 (five) minutes as needed for chest pain. 25 tablet 6  . potassium chloride SA (K-DUR,KLOR-CON) 20 MEQ tablet Take 1 tablet (20 mEq total) by mouth daily. 30 tablet 2  . ranitidine (ZANTAC) 150 MG capsule Take 150 mg by mouth 2 (two) times daily.    Marland Kitchen sulfamethoxazole-trimethoprim (BACTRIM DS,SEPTRA DS) 800-160 MG tablet Take 1 tablet by mouth 2 (two) times daily. With food 14 tablet 0  . traMADol-acetaminophen (ULTRACET) 37.5-325 MG tablet Take 1 tablet by mouth every 6 (six) hours as needed. 10 tablet 0   No facility-administered medications prior to  visit.     ROS Review of Systems  Constitutional: Negative for chills, fever and malaise/fatigue.  Respiratory: Negative for cough and sputum production.   Cardiovascular: Positive for leg swelling. Negative for chest pain, palpitations, orthopnea and PND.  Musculoskeletal: Negative for falls.  Neurological: Negative for dizziness, tingling and tremors.     Objective:  BP 136/60   Pulse 64   Temp 98.5 F (36.9 C)   Ht 5\' 6"  (1.676 m)   Wt 242 lb (109.8 kg)   SpO2 98%   BMI 39.06 kg/m   BP Readings from Last 3 Encounters:  03/02/17 136/60  02/20/17 124/60  02/15/17 (!) 176/75    Wt Readings from Last 3 Encounters:  03/02/17 242 lb (109.8 kg)  02/20/17 238 lb (108 kg)  02/15/17 229 lb 4.8 oz (104 kg)    Physical Exam  Constitutional: He is oriented to person, place, and time. No distress.  Cardiovascular: Normal rate and regular rhythm.   Pulmonary/Chest: Effort normal.  Musculoskeletal: He exhibits edema. He exhibits no tenderness.  Neurological: He is alert and oriented to person, place, and time.  Skin: Skin is warm and dry. Rash noted. Rash is nodular. No erythema.     Vitals reviewed.   Lab Results  Component Value Date   WBC 5.9 02/15/2017   HGB 12.1 (L) 02/15/2017  HCT 37.6 (L) 02/15/2017   PLT 128 (L) 02/15/2017   GLUCOSE 110 (H) 02/15/2017   CHOL 92 (L) 06/09/2015   TRIG 119 06/09/2015   HDL 28 (L) 06/09/2015   LDLCALC 40 06/09/2015   ALT 13 06/07/2016   AST 20 06/07/2016   NA 140 02/15/2017   K 4.1 02/15/2017   CL 106 02/15/2017   CREATININE 0.91 02/15/2017   BUN 14 02/15/2017   CO2 26 02/15/2017   TSH 1.86 09/15/2015   INR 1.46 03/31/2014   HGBA1C 6.4 06/07/2016   MICROALBUR 0.8 03/13/2013    No results found.  Assessment & Plan:   [name] was seen today for follow-up.  Diagnoses and all orders for this visit:  Cellulitis of right lower extremity  Atypical pigmented skin lesion -     Ambulatory referral to Dermatology   I  am having [name] maintain his ferrous sulfate, ranitidine, aspirin EC, nitroGLYCERIN, traMADol-acetaminophen, atorvastatin, furosemide, gabapentin, losartan, metFORMIN, potassium chloride SA, and sulfamethoxazole-trimethoprim.  No orders of the defined types were placed in this encounter.   Follow-up: Return in about 3 months (around 06/02/2017) for with Dr. Sharlet Salina.  Wilfred Lacy, NP

## 2017-03-02 NOTE — Patient Instructions (Signed)
Continue medications as prescribed.   DASH Eating Plan DASH stands for "Dietary Approaches to Stop Hypertension." The DASH eating plan is a healthy eating plan that has been shown to reduce high blood pressure (hypertension). It may also reduce your risk for type 2 diabetes, heart disease, and stroke. The DASH eating plan may also help with weight loss. What are tips for following this plan? General guidelines  Avoid eating more than 2,300 mg (milligrams) of salt (sodium) a day. If you have hypertension, you may need to reduce your sodium intake to 1,500 mg a day.  Limit alcohol intake to no more than 1 drink a day for nonpregnant women and 2 drinks a day for men. One drink equals 12 oz of beer, 5 oz of wine, or 1 oz of hard liquor.  Work with your health care provider to maintain a healthy body weight or to lose weight. Ask what an ideal weight is for you.  Get at least 30 minutes of exercise that causes your heart to beat faster (aerobic exercise) most days of the week. Activities may include walking, swimming, or biking.  Work with your health care provider or diet and nutrition specialist (dietitian) to adjust your eating plan to your individual calorie needs. Reading food labels  Check food labels for the amount of sodium per serving. Choose foods with less than 5 percent of the Daily Value of sodium. Generally, foods with less than 300 mg of sodium per serving fit into this eating plan.  To find whole grains, look for the word "whole" as the first word in the ingredient list. Shopping  Buy products labeled as "low-sodium" or "no salt added."  Buy fresh foods. Avoid canned foods and premade or frozen meals. Cooking  Avoid adding salt when cooking. Use salt-free seasonings or herbs instead of table salt or sea salt. Check with your health care provider or pharmacist before using salt substitutes.  Do not fry foods. Cook foods using healthy methods such as baking, boiling,  grilling, and broiling instead.  Cook with heart-healthy oils, such as olive, canola, soybean, or sunflower oil. Meal planning   Eat a balanced diet that includes: ? 5 or more servings of fruits and vegetables each day. At each meal, try to fill half of your plate with fruits and vegetables. ? Up to 6-8 servings of whole grains each day. ? Less than 6 oz of lean meat, poultry, or fish each day. A 3-oz serving of meat is about the same size as a deck of cards. One egg equals 1 oz. ? 2 servings of low-fat dairy each day. ? A serving of nuts, seeds, or beans 5 times each week. ? Heart-healthy fats. Healthy fats called Omega-3 fatty acids are found in foods such as flaxseeds and coldwater fish, like sardines, salmon, and mackerel.  Limit how much you eat of the following: ? Canned or prepackaged foods. ? Food that is high in trans fat, such as fried foods. ? Food that is high in saturated fat, such as fatty meat. ? Sweets, desserts, sugary drinks, and other foods with added sugar. ? Full-fat dairy products.  Do not salt foods before eating.  Try to eat at least 2 vegetarian meals each week.  Eat more home-cooked food and less restaurant, buffet, and fast food.  When eating at a restaurant, ask that your food be prepared with less salt or no salt, if possible. What foods are recommended? The items listed may not be a complete list.  Talk with your dietitian about what dietary choices are best for you. Grains Whole-grain or whole-wheat bread. Whole-grain or whole-wheat pasta. Brown rice. [place]. Bulgur. Whole-grain and low-sodium cereals. Pita bread. Low-fat, low-sodium crackers. Whole-wheat flour tortillas. Vegetables Fresh or frozen vegetables (raw, steamed, roasted, or grilled). Low-sodium or reduced-sodium tomato and vegetable juice. Low-sodium or reduced-sodium tomato sauce and tomato paste. Low-sodium or reduced-sodium canned vegetables. Fruits All fresh, dried, or frozen  fruit. Canned fruit in natural juice (without added sugar). Meat and other protein foods Skinless chicken or [place]. Ground chicken or [place]. Pork with fat trimmed off. Fish and seafood. Egg whites. Dried beans, peas, or lentils. Unsalted nuts, nut butters, and seeds. Unsalted canned beans. Lean cuts of beef with fat trimmed off. Low-sodium, lean deli meat. Dairy Low-fat (1%) or fat-free (skim) milk. Fat-free, low-fat, or reduced-fat cheeses. Nonfat, low-sodium ricotta or cottage cheese. Low-fat or nonfat yogurt. Low-fat, low-sodium cheese. Fats and oils Soft margarine without trans fats. Vegetable oil. Low-fat, reduced-fat, or light mayonnaise and salad dressings (reduced-sodium). Canola, safflower, olive, soybean, and sunflower oils. Avocado. Seasoning and other foods Herbs. Spices. Seasoning mixes without salt. Unsalted popcorn and pretzels. Fat-free sweets. What foods are not recommended? The items listed may not be a complete list. Talk with your dietitian about what dietary choices are best for you. Grains Baked goods made with fat, such as croissants, muffins, or some breads. Dry pasta or rice meal packs. Vegetables Creamed or fried vegetables. Vegetables in a cheese sauce. Regular canned vegetables (not low-sodium or reduced-sodium). Regular canned tomato sauce and paste (not low-sodium or reduced-sodium). Regular tomato and vegetable juice (not low-sodium or reduced-sodium). Angie Fava. Olives. Fruits Canned fruit in a light or heavy syrup. Fried fruit. Fruit in cream or butter sauce. Meat and other protein foods Fatty cuts of meat. Ribs. Fried meat. Berniece Salines. Sausage. Bologna and other processed lunch meats. Salami. Fatback. Hotdogs. Bratwurst. Salted nuts and seeds. Canned beans with added salt. Canned or smoked fish. Whole eggs or egg yolks. Chicken or [place] with skin. Dairy Whole or 2% milk, cream, and half-and-half. Whole or full-fat cream cheese. Whole-fat or sweetened yogurt. Full-fat  cheese. Nondairy creamers. Whipped toppings. Processed cheese and cheese spreads. Fats and oils Butter. Stick margarine. Lard. Shortening. Ghee. Bacon fat. Tropical oils, such as coconut, palm kernel, or palm oil. Seasoning and other foods Salted popcorn and pretzels. Onion salt, garlic salt, seasoned salt, table salt, and sea salt. Worcestershire sauce. Tartar sauce. Barbecue sauce. Teriyaki sauce. Soy sauce, including reduced-sodium. Steak sauce. Canned and packaged gravies. Fish sauce. Oyster sauce. Cocktail sauce. Horseradish that you find on the shelf. Ketchup. Mustard. Meat flavorings and tenderizers. Bouillon cubes. Hot sauce and Tabasco sauce. Premade or packaged marinades. Premade or packaged taco seasonings. Relishes. Regular salad dressings. Where to find more information:  National Heart, Lung, and [place]: https://wilson-eaton.com/  American Heart Association: www.heart.org Summary  The DASH eating plan is a healthy eating plan that has been shown to reduce high blood pressure (hypertension). It may also reduce your risk for type 2 diabetes, heart disease, and stroke.  With the DASH eating plan, you should limit salt (sodium) intake to 2,300 mg a day. If you have hypertension, you may need to reduce your sodium intake to 1,500 mg a day.  When on the DASH eating plan, aim to eat more fresh fruits and vegetables, whole grains, lean proteins, low-fat dairy, and heart-healthy fats.  Work with your health care provider or diet and nutrition specialist (dietitian) to adjust  your eating plan to your individual calorie needs. This information is not intended to replace advice given to you by your health care provider. Make sure you discuss any questions you have with your health care provider. Document Released: 08/04/2011 Document Revised: 08/08/2016 Document Reviewed: 08/08/2016 Elsevier Interactive Patient Education  2017 [employer].

## 2017-03-03 ENCOUNTER — Encounter: Payer: Self-pay | Admitting: Nurse Practitioner

## 2017-03-13 DIAGNOSIS — D1801 Hemangioma of skin and subcutaneous tissue: Secondary | ICD-10-CM | POA: Diagnosis not present

## 2017-03-13 DIAGNOSIS — D485 Neoplasm of uncertain behavior of skin: Secondary | ICD-10-CM | POA: Diagnosis not present

## 2017-03-13 DIAGNOSIS — B078 Other viral warts: Secondary | ICD-10-CM | POA: Diagnosis not present

## 2017-04-16 ENCOUNTER — Other Ambulatory Visit: Payer: Self-pay | Admitting: Nurse Practitioner

## 2017-04-16 DIAGNOSIS — E118 Type 2 diabetes mellitus with unspecified complications: Secondary | ICD-10-CM

## 2017-04-16 DIAGNOSIS — I110 Hypertensive heart disease with heart failure: Secondary | ICD-10-CM

## 2017-04-16 DIAGNOSIS — E785 Hyperlipidemia, unspecified: Secondary | ICD-10-CM

## 2017-04-16 DIAGNOSIS — R600 Localized edema: Secondary | ICD-10-CM

## 2017-04-17 ENCOUNTER — Encounter: Payer: Self-pay | Admitting: Nurse Practitioner

## 2017-04-17 ENCOUNTER — Ambulatory Visit (INDEPENDENT_AMBULATORY_CARE_PROVIDER_SITE_OTHER): Payer: PPO | Admitting: Nurse Practitioner

## 2017-04-17 VITALS — BP 126/54 | HR 60 | Temp 98.6°F | Ht 66.0 in | Wt 249.0 lb

## 2017-04-17 DIAGNOSIS — L729 Follicular cyst of the skin and subcutaneous tissue, unspecified: Secondary | ICD-10-CM

## 2017-04-17 NOTE — Assessment & Plan Note (Signed)
Posterior left upper thigh (medial aspect). Entered referral to dermatology for excision of cyst.

## 2017-04-17 NOTE — Progress Notes (Signed)
Subjective:  Patient ID: [name], male    DOB: 01/09/1946  Age: 71 y.o. MRN: [id number]  CC: Cyst (knot on left upper leg (under left bottox)--painful- going on for while)   HPI [name] is accompanied by son and grand daughter. He present with left upper thigh cyst which is causing some discomfort. Cyst has been present for over 2years. Due to its location, it gets irritated with palpation and walking or sitting.  Outpatient Medications Prior to Visit  Medication Sig Dispense Refill  . aspirin EC 81 MG tablet Take 1 tablet (81 mg total) by mouth 2 (two) times daily.    Marland Kitchen atorvastatin (LIPITOR) 40 MG tablet TAKE 1 TABLET BY MOUTH EVERY DAY AT 6PM 90 tablet 0  . ferrous sulfate 325 (65 FE) MG tablet Take 325 mg by mouth 2 (two) times daily with a meal.     . furosemide (LASIX) 40 MG tablet TAKE 1 TABLET BY MOUTH TWICE DAILY 180 tablet 0  . gabapentin (NEURONTIN) 100 MG capsule Take 1 capsule (100 mg total) by mouth 2 (two) times daily. 60 capsule 2  . losartan (COZAAR) 50 MG tablet TAKE 1 TABLET BY MOUTH EVERY DAY 90 tablet 0  . metFORMIN (GLUCOPHAGE) 500 MG tablet TAKE 1 TABLET BY MOUTH TWICE DAILY WITH A MEAL 180 tablet 0  . nitroGLYCERIN (NITROSTAT) 0.4 MG SL tablet Place 1 tablet (0.4 mg total) under the tongue every 5 (five) minutes as needed for chest pain. 25 tablet 6  . potassium chloride SA (K-DUR,KLOR-CON) 20 MEQ tablet TAKE 1 TABLET BY MOUTH EVERY DAY 90 tablet 0  . ranitidine (ZANTAC) 150 MG capsule Take 150 mg by mouth 2 (two) times daily.    Marland Kitchen sulfamethoxazole-trimethoprim (BACTRIM DS,SEPTRA DS) 800-160 MG tablet Take 1 tablet by mouth 2 (two) times daily. With food 14 tablet 0  . traMADol-acetaminophen (ULTRACET) 37.5-325 MG tablet Take 1 tablet by mouth every 6 (six) hours as needed. 10 tablet 0   No facility-administered medications prior to visit.     ROS See HPI  Objective:  BP (!) 126/54   Pulse 60   Temp 98.6 F (37 C)   Ht 5\' 6"  (1.676 m)   Wt 249  lb (112.9 kg)   SpO2 98%   BMI 40.19 kg/m   BP Readings from Last 3 Encounters:  04/17/17 (!) 126/54  03/02/17 136/60  02/20/17 124/60    Wt Readings from Last 3 Encounters:  04/17/17 249 lb (112.9 kg)  03/02/17 242 lb (109.8 kg)  02/20/17 238 lb (108 kg)    Physical Exam  Constitutional: He is oriented to person, place, and time.  Neurological: He is alert and oriented to person, place, and time.  Skin: Skin is warm and dry. No erythema.       Lab Results  Component Value Date   WBC 5.9 02/15/2017   HGB 12.1 (L) 02/15/2017   HCT 37.6 (L) 02/15/2017   PLT 128 (L) 02/15/2017   GLUCOSE 110 (H) 02/15/2017   CHOL 92 (L) 06/09/2015   TRIG 119 06/09/2015   HDL 28 (L) 06/09/2015   LDLCALC 40 06/09/2015   ALT 13 06/07/2016   AST 20 06/07/2016   NA 140 02/15/2017   K 4.1 02/15/2017   CL 106 02/15/2017   CREATININE 0.91 02/15/2017   BUN 14 02/15/2017   CO2 26 02/15/2017   TSH 1.86 09/15/2015   INR 1.46 03/31/2014   HGBA1C 6.4 06/07/2016   MICROALBUR 0.8 03/13/2013  No results found.  Assessment & Plan:   [name] was seen today for cyst.  Diagnoses and all orders for this visit:  Subcutaneous cyst -     Ambulatory referral to Dermatology   I am having Mr. Sick maintain his ferrous sulfate, ranitidine, aspirin EC, nitroGLYCERIN, traMADol-acetaminophen, gabapentin, sulfamethoxazole-trimethoprim, atorvastatin, losartan, metFORMIN, potassium chloride SA, and furosemide.  No orders of the defined types were placed in this encounter.   Follow-up: Return if symptoms worsen or fail to improve.  Wilfred Lacy, NP

## 2017-05-09 ENCOUNTER — Encounter (HOSPITAL_COMMUNITY): Payer: Self-pay | Admitting: Emergency Medicine

## 2017-05-09 ENCOUNTER — Emergency Department (HOSPITAL_COMMUNITY)
Admission: EM | Admit: 2017-05-09 | Discharge: 2017-05-09 | Disposition: A | Discharge status: Home / Self Care | Payer: PPO | Attending: Emergency Medicine | Admitting: Emergency Medicine

## 2017-05-09 DIAGNOSIS — Z79899 Other long term (current) drug therapy: Secondary | ICD-10-CM | POA: Diagnosis not present

## 2017-05-09 DIAGNOSIS — J45909 Unspecified asthma, uncomplicated: Secondary | ICD-10-CM | POA: Insufficient documentation

## 2017-05-09 DIAGNOSIS — Z87891 Personal history of nicotine dependence: Secondary | ICD-10-CM | POA: Insufficient documentation

## 2017-05-09 DIAGNOSIS — E119 Type 2 diabetes mellitus without complications: Secondary | ICD-10-CM | POA: Insufficient documentation

## 2017-05-09 DIAGNOSIS — I2510 Atherosclerotic heart disease of native coronary artery without angina pectoris: Secondary | ICD-10-CM | POA: Insufficient documentation

## 2017-05-09 DIAGNOSIS — Z7982 Long term (current) use of aspirin: Secondary | ICD-10-CM | POA: Insufficient documentation

## 2017-05-09 DIAGNOSIS — Z951 Presence of aortocoronary bypass graft: Secondary | ICD-10-CM | POA: Insufficient documentation

## 2017-05-09 DIAGNOSIS — R2232 Localized swelling, mass and lump, left upper limb: Secondary | ICD-10-CM | POA: Insufficient documentation

## 2017-05-09 DIAGNOSIS — Z955 Presence of coronary angioplasty implant and graft: Secondary | ICD-10-CM | POA: Diagnosis not present

## 2017-05-09 DIAGNOSIS — I110 Hypertensive heart disease with heart failure: Secondary | ICD-10-CM | POA: Insufficient documentation

## 2017-05-09 DIAGNOSIS — Z7984 Long term (current) use of oral hypoglycemic drugs: Secondary | ICD-10-CM | POA: Insufficient documentation

## 2017-05-09 DIAGNOSIS — R229 Localized swelling, mass and lump, unspecified: Secondary | ICD-10-CM

## 2017-05-09 DIAGNOSIS — I509 Heart failure, unspecified: Secondary | ICD-10-CM | POA: Insufficient documentation

## 2017-05-09 DIAGNOSIS — R2242 Localized swelling, mass and lump, left lower limb: Secondary | ICD-10-CM | POA: Diagnosis not present

## 2017-05-09 HISTORY — DX: Heart failure, unspecified: I50.9

## 2017-05-09 MED ORDER — OXYCODONE-ACETAMINOPHEN 5-325 MG PO TABS
1.0000 | ORAL_TABLET | Freq: Once | ORAL | Status: AC
  Administered 2017-05-09: 1 via ORAL
  Filled 2017-05-09: qty 1

## 2017-05-09 MED ORDER — OXYCODONE-ACETAMINOPHEN 5-325 MG PO TABS: 1.0000 | ORAL_TABLET | ORAL | 0 refills | Status: DC | PRN

## 2017-05-09 NOTE — Discharge Instructions (Signed)
Keep your appointment with the dermatologist as already scheduled. Return here for fever, red streaks going up her leg, or any other problems

## 2017-05-09 NOTE — ED Provider Notes (Signed)
[facility] DEPT Provider Note   CSN: [id number] Arrival date & time: 05/09/17  1422     History   Chief Complaint Chief Complaint  Patient presents with  . knot on leg    HPI [name] is a 71 y.o. male.  71 year old male presents with 2 year history of sebaceous cyst to his left upper thigh. Has appointment with a dermatologist to have this removed in a few weeks but due to [place] pain is worse with standing without fever or chills he presents at this time. Denies any new trauma. Denies any drainage.symptoms are worse with sitting.      Past Medical History:  Diagnosis Date  . Arthritis    "joints tighten up sometimes" (03/27/2104)  . Asthma   . CHF (congestive heart failure) ([place])   . Chronic low back pain   . Chronic lower back pain   . Coronary artery disease    a. 05/2012 Cath/PCI: LM 40ost, 50-60d, LAD 99p ruptured plaque (3.0x28 DES), LCX 50p/m, RCA 30-40p, 73m, EF 65-70%.  Marland Kitchen GERD (gastroesophageal reflux disease)   . Hepatic cirrhosis ([place])    a. Dx 01/2014 - CT a/p   . History of blood transfusion    "related to bleeding ulcers"  . History of concussion    1976--  NO RESIDUAL  . History of GI bleed    a. UGIB 07/2012;  b. 01/2014 admission with GIB/FOB stool req 1U prbc's->EGD showed portal gastropathy, barrett's esoph, and chronic active h. pylori gastritis.  Marland Kitchen History of gout    2007 &  2008  LEFT LEG-- NO ISSUE SINCE  . Hyperlipidemia   . Iron deficiency anemia   . Kidney stones   . OA (osteoarthritis of spine)    LOWER BACK--  INTERMITTANT LEFT LEG NUMBNESS  . OSA (obstructive sleep apnea)    PULMOLOGIST-  DR CLANCE--  MODERATE OSA  STARTED CPAP 2012--  BUT CURRENTLY HAS NOT USED PAST 6 MONTHS  . Phimosis    a. s/p circumcision 2015.  . Type 2 diabetes mellitus ([place])   . Unspecified essential hypertension     Patient Active Problem List   Diagnosis Date Noted  . Subcutaneous cyst 04/17/2017  . Morbid obesity ([place]) 02/05/2016  . Numerous  moles 09/15/2015  . S/P CABG x 3 03/31/2014  . History of GI bleed   . Unstable angina ([place]) 03/27/2014  . Cirrhosis of liver without mention of alcohol 02/27/2014  . Hyperlipidemia with target LDL less than 70 06/29/2012  . Coronary artery disease    . OSA (obstructive sleep apnea)   . Type 2 diabetes with complication ([place]) 27/01/2375  . GERD 08/04/2010  . History of gout   . Hypertensive heart disease     Past Surgical History:  Procedure Laterality Date  . ANKLE FRACTURE SURGERY Right 1989   "plate put in"  . APPENDECTOMY  05-16-2004   open  . CIRCUMCISION N/A 09/09/2013   Procedure: CIRCUMCISION ADULT;  Surgeon: Bernestine Amass, MD;  Location[facility];  Service: Urology;  Laterality: N/A;  . COLECTOMY  05-16-2004  . CORONARY ANGIOPLASTY WITH STENT PLACEMENT  06/28/2012  DR COOPER   PCI W/  X1 DES to [place]. LAD/  LM  40% OSTIAL & 50-60% DISTAL /  50% PROX LCX/  30-40% PROX RCA & 50% MID RCA/   LVEF 65-70%  . CORONARY ARTERY BYPASS GRAFT N/A 03/31/2014   Procedure: CORONARY ARTERY BYPASS GRAFTING (CABG) times 3 using left  internal mammary artery and right saphenous vein.;  Surgeon: Melrose Nakayama, MD;  Location: [facility];  Service: Open Heart Surgery;  Laterality: N/A;  . DOBUTAMINE STRESS ECHO  06-08-2012   MODERATE HYPOKINESIS/ ISCHEMIA MID INFERIOR WALL  . ESOPHAGOGASTRODUODENOSCOPY  08/15/2012   Procedure: ESOPHAGOGASTRODUODENOSCOPY (EGD);  Surgeon: Wonda Horner, MD;  Location[facility] ENDOSCOPY;  Service: Endoscopy;  Laterality: N/A;  . ESOPHAGOGASTRODUODENOSCOPY N/A 02/17/2014   Procedure: ESOPHAGOGASTRODUODENOSCOPY (EGD);  Surgeon: Jeryl Columbia, MD;  Location: [facility] ENDOSCOPY;  Service: Endoscopy;  Laterality: N/A;  . HERNIA REPAIR    . INTRAOPERATIVE TRANSESOPHAGEAL ECHOCARDIOGRAM N/A 03/31/2014   Procedure: INTRAOPERATIVE TRANSESOPHAGEAL ECHOCARDIOGRAM;  Surgeon: Melrose Nakayama, MD;  Location: [facility];  Service: Open Heart Surgery;  Laterality: N/A;  .  LAPAROSCOPIC UMBILICAL HERNIA REPAIR W/ MESH  06-06-2011  . LEFT HEART CATHETERIZATION WITH CORONARY ANGIOGRAM N/A 03/28/2014   Procedure: LEFT HEART CATHETERIZATION WITH CORONARY ANGIOGRAM;  Surgeon: Sinclair Grooms, MD;  Location: [facility] CATH LAB;  Service: Cardiovascular;  Laterality: N/A;  . NEPHROLITHOTOMY  1990'S  . OPEN APPENDECTOMY W/ PARTIAL CECECTOMY  05-16-2004  . PERCUTANEOUS CORONARY STENT INTERVENTION (PCI-S) N/A 06/28/2012   Procedure: PERCUTANEOUS CORONARY STENT INTERVENTION (PCI-S);  Surgeon: Sherren Mocha, MD;  Location: [facility] CATH LAB;  Service: Cardiovascular;  Laterality: N/A;       Home Medications    Prior to Admission medications   Medication Sig Start Date End Date Taking? Authorizing Provider  aspirin EC 81 MG tablet Take 1 tablet (81 mg total) by mouth 2 (two) times daily. 06/09/15   Nahser, Wonda Cheng, MD  atorvastatin (LIPITOR) 40 MG tablet TAKE 1 TABLET BY MOUTH EVERY DAY AT 6PM 04/17/17   Nche, Charlene Brooke, NP  ferrous sulfate 325 (65 FE) MG tablet Take 325 mg by mouth 2 (two) times daily with a meal.     [provider]  furosemide (LASIX) 40 MG tablet TAKE 1 TABLET BY MOUTH TWICE DAILY 04/17/17   Nche, Charlene Brooke, NP  gabapentin (NEURONTIN) 100 MG capsule Take 1 capsule (100 mg total) by mouth 2 (two) times daily. 02/20/17   Nche, Charlene Brooke, NP  losartan (COZAAR) 50 MG tablet TAKE 1 TABLET BY MOUTH EVERY DAY 04/17/17   Nche, Charlene Brooke, NP  metFORMIN (GLUCOPHAGE) 500 MG tablet TAKE 1 TABLET BY MOUTH TWICE DAILY WITH A MEAL 04/17/17   Nche, Charlene Brooke, NP  nitroGLYCERIN (NITROSTAT) 0.4 MG SL tablet Place 1 tablet (0.4 mg total) under the tongue every 5 (five) minutes as needed for chest pain. 06/08/16 06/08/17  Nahser, Wonda Cheng, MD  potassium chloride SA (K-DUR,KLOR-CON) 20 MEQ tablet TAKE 1 TABLET BY MOUTH EVERY DAY 04/17/17   Nche, Charlene Brooke, NP  ranitidine (ZANTAC) 150 MG capsule Take 150 mg by mouth 2 (two) times daily.    [provider]  sulfamethoxazole-trimethoprim (BACTRIM DS,SEPTRA DS) 800-160 MG tablet Take 1 tablet by mouth 2 (two) times daily. With food 02/20/17   Nche, Charlene Brooke, NP  traMADol-acetaminophen (ULTRACET) 37.5-325 MG tablet Take 1 tablet by mouth every 6 (six) hours as needed. 02/15/17   Charlann Lange, PA-C    Family History Family History  Problem Relation Age of Onset  . Lung cancer Sister   . Cancer Sister        lung  . Cancer Mother   . Cancer Father        died in his 53s.  . Cancer Brother        lung  .  Coronary artery disease Unknown   . Diabetes Unknown   . Colon cancer Unknown   . Cancer Sister        lung    Social History Social History  Substance Use Topics  . Smoking status: Former Smoker    Packs/day: 2.00    Years: 40.00    Types: Cigarettes    Quit date: 08/29/1996  . Smokeless tobacco: Never Used  . Alcohol use 0.0 oz/week     Comment: 03/27/2014  still drinks rum; a few glasses per week; and beer sometime; states he does not drink like he did      Allergies   Patient has no known allergies.   Review of Systems Review of Systems  All other systems reviewed and are negative.    Physical Exam Updated Vital Signs BP (!) 158/69 (BP Location: Left Arm)   Pulse (!) 54   Temp 98.6 F (37 C) (Oral)   Resp 18   SpO2 93%   Physical Exam  Constitutional: He is oriented to person, place, and time. He appears well-developed and well-nourished.  Non-toxic appearance. No distress.  HENT:  Head: Normocephalic and atraumatic.  Eyes: Pupils are equal, round, and reactive to light. Conjunctivae, EOM and lids are normal.  Neck: Normal range of motion. Neck supple. No tracheal deviation present. No thyroid mass present.  Cardiovascular: Normal rate, regular rhythm and normal heart sounds.  Exam reveals no gallop.   No murmur heard. Pulmonary/Chest: Effort normal and breath sounds normal. No stridor. No respiratory distress. He has no decreased breath  sounds. He has no wheezes. He has no rhonchi. He has no rales.  Abdominal: Soft. Normal appearance and bowel sounds are normal. He exhibits no distension. There is no tenderness. There is no rebound and no CVA tenderness.  Musculoskeletal: Normal range of motion. He exhibits no edema or tenderness.       Legs: Neurological: He is alert and oriented to person, place, and time. He has normal strength. No cranial nerve deficit or sensory deficit. GCS eye subscore is 4. GCS verbal subscore is 5. GCS motor subscore is 6.  Skin: Skin is warm and dry. No abrasion and no rash noted.  Psychiatric: He has a normal mood and affect. His speech is normal and behavior is normal.  Nursing note and vitals reviewed.    ED Treatments / Results  Labs (all labs ordered are listed, but only abnormal results are displayed) Labs Reviewed - No data to display  EKG  EKG Interpretation None       Radiology No results found.  Procedures Procedures (including critical care time)  Medications Ordered in ED Medications - No data to display   Initial Impression / Assessment and Plan / ED Course  I have reviewed the triage vital signs and the nursing notes.  Pertinent labs & imaging results that were available during my care of the patient were reviewed by me and considered in my medical decision making (see chart for details).    patientwith subcutaneous cystwithout signs of infection. Medicated for pain here and was instructed to follow-up with his doctor  Final Clinical Impressions(s) / ED Diagnoses   Final diagnoses:  None    New Prescriptions New Prescriptions   No medications on file     Lacretia Leigh, MD 05/09/17 1943

## 2017-05-09 NOTE — ED Triage Notes (Signed)
Patient has knot on posterior left upper leg that has been there for 2-3 months that has gotten bigger and more painful. Patient PCP referred to dermatologist for area but apt not til 23rd of this month and spot gotten to painful keeping patient awake at night and trouble sitting.

## 2017-06-02 ENCOUNTER — Ambulatory Visit: Payer: PPO | Admitting: Internal Medicine

## 2017-06-20 DIAGNOSIS — D485 Neoplasm of uncertain behavior of skin: Secondary | ICD-10-CM | POA: Diagnosis not present

## 2017-06-20 DIAGNOSIS — D179 Benign lipomatous neoplasm, unspecified: Secondary | ICD-10-CM | POA: Diagnosis not present

## 2017-06-20 DIAGNOSIS — D1801 Hemangioma of skin and subcutaneous tissue: Secondary | ICD-10-CM | POA: Diagnosis not present

## 2017-06-30 ENCOUNTER — Other Ambulatory Visit: Payer: Self-pay | Admitting: Nurse Practitioner

## 2017-06-30 DIAGNOSIS — E118 Type 2 diabetes mellitus with unspecified complications: Secondary | ICD-10-CM

## 2017-06-30 DIAGNOSIS — R600 Localized edema: Secondary | ICD-10-CM

## 2017-06-30 DIAGNOSIS — I110 Hypertensive heart disease with heart failure: Secondary | ICD-10-CM

## 2017-07-03 ENCOUNTER — Other Ambulatory Visit: Payer: Self-pay | Admitting: Nurse Practitioner

## 2017-07-03 DIAGNOSIS — E785 Hyperlipidemia, unspecified: Secondary | ICD-10-CM

## 2017-07-03 DIAGNOSIS — I110 Hypertensive heart disease with heart failure: Secondary | ICD-10-CM

## 2017-07-03 DIAGNOSIS — E118 Type 2 diabetes mellitus with unspecified complications: Secondary | ICD-10-CM

## 2017-07-03 DIAGNOSIS — R600 Localized edema: Secondary | ICD-10-CM

## 2017-07-04 ENCOUNTER — Other Ambulatory Visit: Payer: Self-pay | Admitting: Internal Medicine

## 2017-07-04 DIAGNOSIS — E785 Hyperlipidemia, unspecified: Secondary | ICD-10-CM

## 2017-07-04 DIAGNOSIS — R600 Localized edema: Secondary | ICD-10-CM

## 2017-07-04 DIAGNOSIS — E118 Type 2 diabetes mellitus with unspecified complications: Secondary | ICD-10-CM

## 2017-07-04 DIAGNOSIS — I110 Hypertensive heart disease with heart failure: Secondary | ICD-10-CM

## 2017-07-04 NOTE — Telephone Encounter (Signed)
Okay to fill? 

## 2017-07-09 ENCOUNTER — Other Ambulatory Visit: Payer: Self-pay

## 2017-07-09 ENCOUNTER — Encounter (HOSPITAL_COMMUNITY): Payer: Self-pay

## 2017-07-09 ENCOUNTER — Emergency Department (HOSPITAL_COMMUNITY)
Admission: EM | Admit: 2017-07-09 | Discharge: 2017-07-09 | Disposition: A | Discharge status: Home / Self Care | Payer: PPO | Attending: Emergency Medicine | Admitting: Emergency Medicine

## 2017-07-09 DIAGNOSIS — E119 Type 2 diabetes mellitus without complications: Secondary | ICD-10-CM | POA: Insufficient documentation

## 2017-07-09 DIAGNOSIS — I509 Heart failure, unspecified: Secondary | ICD-10-CM | POA: Insufficient documentation

## 2017-07-09 DIAGNOSIS — Z87891 Personal history of nicotine dependence: Secondary | ICD-10-CM | POA: Insufficient documentation

## 2017-07-09 DIAGNOSIS — I2510 Atherosclerotic heart disease of native coronary artery without angina pectoris: Secondary | ICD-10-CM | POA: Insufficient documentation

## 2017-07-09 DIAGNOSIS — Z79899 Other long term (current) drug therapy: Secondary | ICD-10-CM | POA: Diagnosis not present

## 2017-07-09 DIAGNOSIS — Z951 Presence of aortocoronary bypass graft: Secondary | ICD-10-CM | POA: Diagnosis not present

## 2017-07-09 DIAGNOSIS — I110 Hypertensive heart disease with heart failure: Secondary | ICD-10-CM | POA: Insufficient documentation

## 2017-07-09 DIAGNOSIS — R2242 Localized swelling, mass and lump, left lower limb: Secondary | ICD-10-CM | POA: Diagnosis present

## 2017-07-09 DIAGNOSIS — L729 Follicular cyst of the skin and subcutaneous tissue, unspecified: Secondary | ICD-10-CM | POA: Insufficient documentation

## 2017-07-09 DIAGNOSIS — B432 Subcutaneous pheomycotic abscess and cyst: Secondary | ICD-10-CM | POA: Diagnosis not present

## 2017-07-09 DIAGNOSIS — J45909 Unspecified asthma, uncomplicated: Secondary | ICD-10-CM | POA: Diagnosis not present

## 2017-07-09 DIAGNOSIS — Z7984 Long term (current) use of oral hypoglycemic drugs: Secondary | ICD-10-CM | POA: Diagnosis not present

## 2017-07-09 DIAGNOSIS — Z7982 Long term (current) use of aspirin: Secondary | ICD-10-CM | POA: Insufficient documentation

## 2017-07-09 MED ORDER — ACETAMINOPHEN 500 MG PO TABS
500.0000 mg | ORAL_TABLET | Freq: Once | ORAL | Status: AC
  Administered 2017-07-09: 500 mg via ORAL
  Filled 2017-07-09: qty 1

## 2017-07-09 MED ORDER — HYDROCODONE-ACETAMINOPHEN 5-325 MG PO TABS
1.0000 | ORAL_TABLET | Freq: Once | ORAL | Status: AC
  Administered 2017-07-09: 1 via ORAL
  Filled 2017-07-09: qty 1

## 2017-07-09 NOTE — ED Triage Notes (Signed)
Patient complains of knot to left posterior upper leg x 3 months, complains of increased pain to same, nad

## 2017-07-09 NOTE — Discharge Instructions (Signed)
You have a subcutaneous cyst. It is not infected.   Take vicodin as prescribed with additional 500 mg tylenol. Wear loose clothing and avoid palpation.   Any cyst can get infected. Monitor for increased swelling, pain, redness, warmth, pus, fevers.  Cysts are electively removed by general surgery please contact [facility] to make an appointment for evaluation.

## 2017-07-09 NOTE — ED Provider Notes (Signed)
[facility] EMERGENCY DEPARTMENT Provider Note   CSN: [id number] Arrival date & time: 07/09/17  1306     History   Chief Complaint No chief complaint on file.   HPI [name] is a 71 y.o. male  with history of CAD status post CABG and stenting, non-insulin-dependent diabetes, obesity presents to the ED for evaluation of cyst to the left medial upper thigh ongoing for 3 months, swelling and pain of cyst acutely worsened over the last few days. States he saw his PCP for cyst who referred him to dermatology. He had a mole removed by dermatology recently and when he asked dermatologist about cyst in the thigh he was told to follow up with general surgery. He has not followed up with general surgery yet. Pain to this area is worse with sitting down and walking. Has been taking Vicodin which provided some relief of pain.  He denies fevers, chills, redness to the area. HPI  Past Medical History:  Diagnosis Date  . Arthritis    "joints tighten up sometimes" (03/27/2104)  . Asthma   . CHF (congestive heart failure) ([place])   . Chronic low back pain   . Chronic lower back pain   . Coronary artery disease    a. 05/2012 Cath/PCI: LM 40ost, 50-60d, LAD 99p ruptured plaque (3.0x28 DES), LCX 50p/m, RCA 30-40p, 72m, EF 65-70%.  Marland Kitchen GERD (gastroesophageal reflux disease)   . Hepatic cirrhosis ([place])    a. Dx 01/2014 - CT a/p   . History of blood transfusion    "related to bleeding ulcers"  . History of concussion    1976--  NO RESIDUAL  . History of GI bleed    a. UGIB 07/2012;  b. 01/2014 admission with GIB/FOB stool req 1U prbc's->EGD showed portal gastropathy, barrett's esoph, and chronic active h. pylori gastritis.  Marland Kitchen History of gout    2007 &  2008  LEFT LEG-- NO ISSUE SINCE  . Hyperlipidemia   . Iron deficiency anemia   . Kidney stones   . OA (osteoarthritis of spine)    LOWER BACK--  INTERMITTANT LEFT LEG NUMBNESS  . OSA (obstructive sleep apnea)    PULMOLOGIST-   DR CLANCE--  MODERATE OSA  STARTED CPAP 2012--  BUT CURRENTLY HAS NOT USED PAST 6 MONTHS  . Phimosis    a. s/p circumcision 2015.  . Type 2 diabetes mellitus ([place])   . Unspecified essential hypertension     Patient Active Problem List   Diagnosis Date Noted  . Subcutaneous cyst 04/17/2017  . Morbid obesity ([place]) 02/05/2016  . Numerous moles 09/15/2015  . S/P CABG x 3 03/31/2014  . History of GI bleed   . Unstable angina ([place]) 03/27/2014  . Cirrhosis of liver without mention of alcohol 02/27/2014  . Hyperlipidemia with target LDL less than 70 06/29/2012  . Coronary artery disease    . OSA (obstructive sleep apnea)   . Type 2 diabetes with complication ([place]) 40/98/1191  . GERD 08/04/2010  . History of gout   . Hypertensive heart disease     Past Surgical History:  Procedure Laterality Date  . ANKLE FRACTURE SURGERY Right 1989   "plate put in"  . APPENDECTOMY  05-16-2004   open  . CIRCUMCISION N/A 09/09/2013   Procedure: CIRCUMCISION ADULT;  Surgeon: Bernestine Amass, MD;  Location: [facility];  Service: Urology;  Laterality: N/A;  . COLECTOMY  05-16-2004  . CORONARY ANGIOPLASTY WITH STENT PLACEMENT  06/28/2012  DR COOPER   PCI W/  X1 DES to [place]. LAD/  LM  40% OSTIAL & 50-60% DISTAL /  50% PROX LCX/  30-40% PROX RCA & 50% MID RCA/   LVEF 65-70%  . CORONARY ARTERY BYPASS GRAFT N/A 03/31/2014   Procedure: CORONARY ARTERY BYPASS GRAFTING (CABG) times 3 using left internal mammary artery and right saphenous vein.;  Surgeon: Melrose Nakayama, MD;  Location: [facility];  Service: Open Heart Surgery;  Laterality: N/A;  . DOBUTAMINE STRESS ECHO  06-08-2012   MODERATE HYPOKINESIS/ ISCHEMIA MID INFERIOR WALL  . ESOPHAGOGASTRODUODENOSCOPY  08/15/2012   Procedure: ESOPHAGOGASTRODUODENOSCOPY (EGD);  Surgeon: Wonda Horner, MD;  Location: [facility] ENDOSCOPY;  Service: Endoscopy;  Laterality: N/A;  . ESOPHAGOGASTRODUODENOSCOPY N/A 02/17/2014   Procedure: ESOPHAGOGASTRODUODENOSCOPY (EGD);   Surgeon: Jeryl Columbia, MD;  Location: [facility] ENDOSCOPY;  Service: Endoscopy;  Laterality: N/A;  . HERNIA REPAIR    . INTRAOPERATIVE TRANSESOPHAGEAL ECHOCARDIOGRAM N/A 03/31/2014   Procedure: INTRAOPERATIVE TRANSESOPHAGEAL ECHOCARDIOGRAM;  Surgeon: Melrose Nakayama, MD;  Location: [facility];  Service: Open Heart Surgery;  Laterality: N/A;  . LAPAROSCOPIC UMBILICAL HERNIA REPAIR W/ MESH  06-06-2011  . LEFT HEART CATHETERIZATION WITH CORONARY ANGIOGRAM N/A 03/28/2014   Procedure: LEFT HEART CATHETERIZATION WITH CORONARY ANGIOGRAM;  Surgeon: Sinclair Grooms, MD;  Location: [facility] CATH LAB;  Service: Cardiovascular;  Laterality: N/A;  . NEPHROLITHOTOMY  1990'S  . OPEN APPENDECTOMY W/ PARTIAL CECECTOMY  05-16-2004  . PERCUTANEOUS CORONARY STENT INTERVENTION (PCI-S) N/A 06/28/2012   Procedure: PERCUTANEOUS CORONARY STENT INTERVENTION (PCI-S);  Surgeon: Sherren Mocha, MD;  Location: [facility] CATH LAB;  Service: Cardiovascular;  Laterality: N/A;       Home Medications    Prior to Admission medications   Medication Sig Start Date End Date Taking? Authorizing Provider  aspirin EC 81 MG tablet Take 1 tablet (81 mg total) by mouth 2 (two) times daily. 06/09/15   Nahser, Wonda Cheng, MD  atorvastatin (LIPITOR) 40 MG tablet TAKE 1 TABLET BY MOUTH EVERY DAY AT 6PM 04/17/17   Nche, Charlene Brooke, NP  atorvastatin (LIPITOR) 40 MG tablet TAKE 1 TABLET BY MOUTH DAILY AT 6 PM 07/04/17   [name], MD  ferrous sulfate 325 (65 FE) MG tablet Take 325 mg by mouth 2 (two) times daily with a meal.     [provider]  furosemide (LASIX) 40 MG tablet TAKE 1 TABLET BY MOUTH TWICE DAILY 04/17/17   Nche, Charlene Brooke, NP  furosemide (LASIX) 40 MG tablet TAKE 1 TABLET BY MOUTH TWICE DAILY 07/04/17   [name], MD  gabapentin (NEURONTIN) 100 MG capsule TAKE ONE CAPSULE BY MOUTH TWICE DAILY 07/04/17   [name], MD  losartan (COZAAR) 50 MG tablet TAKE 1 TABLET BY MOUTH EVERY DAY 04/17/17   Nche,  Charlene Brooke, NP  losartan (COZAAR) 50 MG tablet TAKE 1 TABLET BY MOUTH EVERY DAY 07/04/17   [name], MD  metFORMIN (GLUCOPHAGE) 500 MG tablet TAKE 1 TABLET BY MOUTH TWICE DAILY WITH A MEAL 04/17/17   Nche, Charlene Brooke, NP  metFORMIN (GLUCOPHAGE) 500 MG tablet TAKE 1 TABLET BY MOUTH TWICE DAILY WITH A MEAL 07/04/17   [name], MD  nitroGLYCERIN (NITROSTAT) 0.4 MG SL tablet Place 1 tablet (0.4 mg total) under the tongue every 5 (five) minutes as needed for chest pain. 06/08/16 06/08/17  Nahser, Wonda Cheng, MD  oxyCODONE-acetaminophen (PERCOCET/ROXICET) 5-325 MG tablet Take 1-2 tablets by mouth every 4 (four) hours as needed for  severe pain. 05/09/17   Lacretia Leigh, MD  potassium chloride SA (K-DUR,KLOR-CON) 20 MEQ tablet TAKE 1 TABLET BY MOUTH EVERY DAY 04/17/17   Nche, Charlene Brooke, NP  potassium chloride SA (K-DUR,KLOR-CON) 20 MEQ tablet TAKE 1 TABLET BY MOUTH EVERY DAY 07/04/17   [name], MD  ranitidine (ZANTAC) 150 MG capsule Take 150 mg by mouth 2 (two) times daily.    [provider]  traMADol-acetaminophen (ULTRACET) 37.5-325 MG tablet Take 1 tablet by mouth every 6 (six) hours as needed. 02/15/17   Charlann Lange, PA-C    Family History Family History  Problem Relation Age of Onset  . Lung cancer Sister   . Cancer Sister        lung  . Cancer Mother   . Cancer Father        died in his 61s.  . Cancer Brother        lung  . Coronary artery disease Unknown   . Diabetes Unknown   . Colon cancer Unknown   . Cancer Sister        lung    Social History Social History   Tobacco Use  . Smoking status: Former Smoker    Packs/day: 2.00    Years: 40.00    Pack years: 80.00    Types: Cigarettes    Last attempt to quit: 08/29/1996    Years since quitting: 20.8  . Smokeless tobacco: Never Used  Substance Use Topics  . Alcohol use: Yes    Alcohol/week: 0.0 oz    Comment: 03/27/2014  still drinks rum; a few glasses per week; and beer  sometime; states he does not drink like he did   . Drug use: No     Allergies   Patient has no known allergies.   Review of Systems Review of Systems  Skin:       +cyst  All other systems reviewed and are negative.    Physical Exam Updated Vital Signs BP (!) 127/58   Pulse (!) 56   Temp 98.8 F (37.1 C) (Oral)   Resp 18   Ht 5\' 4"  (1.626 m)   Wt 111.1 kg (245 lb)   SpO2 92%   BMI 42.05 kg/m   Physical Exam  Constitutional: He is oriented to person, place, and time. He appears well-developed and well-nourished. No distress.  NAD.  HENT:  Head: Normocephalic and atraumatic.  Right Ear: External ear normal.  Left Ear: External ear normal.  Nose: Nose normal.  Eyes: Conjunctivae and EOM are normal. No scleral icterus.  Neck: Normal range of motion. Neck supple.  Cardiovascular: Normal rate, regular rhythm, normal heart sounds and intact distal pulses.  No murmur heard. Pulmonary/Chest: Effort normal and breath sounds normal. He has no wheezes.  Musculoskeletal: Normal range of motion. He exhibits no deformity.  Neurological: He is alert and oriented to person, place, and time.  Skin: Skin is warm and dry. Capillary refill takes less than 2 seconds.  22 cm freely mobile, mildly tender nodule to the left medial upper thigh without surrounding erythema, warmth, fluctuance, induration  Psychiatric: He has a normal mood and affect. His behavior is normal. Judgment and thought content normal.  Nursing note and vitals reviewed.    ED Treatments / Results  Labs (all labs ordered are listed, but only abnormal results are displayed) Labs Reviewed - No data to display  EKG  EKG Interpretation None       Radiology No results found.  Procedures  Procedures (including critical care time)  Medications Ordered in ED Medications  HYDROcodone-acetaminophen (NORCO/VICODIN) 5-325 MG per tablet 1 tablet (1 tablet Oral Given 07/09/17 1410)  acetaminophen (TYLENOL)  tablet 500 mg (500 mg Oral Given 07/09/17 1410)     Initial Impression / Assessment and Plan / ED Course  I have reviewed the triage vital signs and the nursing notes.  Pertinent labs & imaging results that were available during my care of the patient were reviewed by me and considered in my medical decision making (see chart for details).    72 year old male presents for evaluation of irritated cyst to left upper/medial thigh ongoing for several months, pain acutely worsening in the last few days. Exam is reassuring, he has a freely mobile, mildly tender cyst to this area without surrounding evidence of infection, cellulitis, abscess. He has been evaluated by PCP and dermatologist and advised to follow-up with Gen. surgery which he has not been able to do yet. No further emergent lab work or imaging indicated at this time. We'll discharge with conservative management. Urged follow-up with general surgery for excision. Reassured patient, and discussed specific symptoms that would warrant return to the ED for reevaluation. Final Clinical Impressions(s) / ED Diagnoses   Final diagnoses:  Subcutaneous cyst    ED Discharge Orders    None       Kinnie Feil, PA-C 07/13/17 2243    Blanchie Dessert, MD 07/14/17 [phone]

## 2017-07-26 ENCOUNTER — Ambulatory Visit: Payer: Self-pay | Admitting: Surgery

## 2017-07-26 DIAGNOSIS — D179 Benign lipomatous neoplasm, unspecified: Secondary | ICD-10-CM | POA: Diagnosis not present

## 2017-07-26 NOTE — H&P (Signed)
[name] 07/26/2017 1:38 PM Location: [name] Patient #: [id number] DOB: 1945/12/15 Married / Language: [name] / Race: White Male  History of Present Illness ([name] A. [name] MD; 07/26/2017 2:00 PM) Patient words: Increasingly uncomfortable lipoma to left upper posterior thigh. Has been to PCP and derm, and to the ER twice. Desires excision. It hurts to sit and when clothes rub on it. NO prior procedures here, no history of drainage or infection. Denies chest pain or shortness of breath. Does note some left hip pain. Significant comorbidities including diabetes, OSA, cirrhosis, CHF, CAD s/p CABG a few years ago.  The patient is a 71 year old male.   Past Surgical History [name], [name]; 07/26/2017 1:38 PM) Bypass Surgery for Poor Blood Flow to Legs Coronary Artery Bypass Graft  Diagnostic Studies History [name], [name]; 07/26/2017 1:38 PM) Colonoscopy within last year  Allergies [name], [name]; 07/26/2017 1:39 PM) No Known Drug Allergies 07/26/2017  Medication History [name], [name]; 07/26/2017 1:41 PM) Atorvastatin Calcium (40MG  Tablet, Oral) Active. Furosemide (40MG  Tablet, Oral) Active. Gabapentin (100MG  Capsule, Oral) Active. Losartan Potassium (50MG  Tablet, Oral) Active. MetFORMIN HCl (500MG  Tablet, Oral) Active. Potassium Chloride Crys ER (20MEQ Tablet ER, Oral) Active. Iron (325 (65 Fe)MG Tablet, Oral) Active. RaNITidine HCl (150MG  Tablet, Oral) Active. Medications Reconciled  Social History [name], [name]; 07/26/2017 1:38 PM) Alcohol use Occasional alcohol use. Caffeine use Carbonated beverages, Coffee, Tea. No drug use Tobacco use Current every day smoker.  Family History [name], [name]; 07/26/2017 1:38 PM) Alcohol Abuse Brother, Father. Arthritis Father. Heart Disease Family Members In General, Mother. Heart disease in male family member before age 12 Hypertension Son.  Other Problems [name], [name]; 07/26/2017 1:38 PM) Asthma Back Pain Chest pain Congestive Heart Failure Diabetes Mellitus High blood pressure Hypercholesterolemia Sleep Apnea Transfusion history     Review of Systems ([name] [name]; 07/26/2017 1:38 PM) General Present- Fatigue. Not Present- Appetite Loss, Chills, Fever, Night Sweats, Weight Gain and Weight Loss. Skin Present- Change in Wart/Mole and Dryness. Not Present- Hives, Jaundice, New Lesions, Non-Healing Wounds, Rash and Ulcer. HEENT Present- Hearing Loss and Ringing in the Ears. Not Present- Earache, Hoarseness, Nose Bleed, Oral Ulcers, Seasonal Allergies, Sinus Pain, Sore Throat, Visual Disturbances, Wears glasses/contact lenses and Yellow Eyes. Respiratory Present- Difficulty Breathing, Snoring and Wheezing. Not Present- Bloody sputum and Chronic Cough. Breast Not Present- Breast Mass, Breast Pain, Nipple Discharge and Skin Changes. Cardiovascular Present- Leg Cramps and Swelling of Extremities. Not Present- Chest Pain, Difficulty Breathing Lying Down, Palpitations, Rapid Heart Rate and Shortness of Breath. Gastrointestinal Present- Excessive gas. Not Present- Abdominal Pain, Bloating, Bloody Stool, Change in Bowel Habits, Chronic diarrhea, Constipation, Difficulty Swallowing, Gets full quickly at meals, Hemorrhoids, Indigestion, Nausea, Rectal Pain and Vomiting. Male Genitourinary Present- Urine Leakage. Not Present- Blood in Urine, Change in Urinary Stream, Frequency, Impotence, Nocturia, Painful Urination and Urgency. Musculoskeletal Present- Back Pain. Not Present- Joint Pain, Joint Stiffness, Muscle Pain, Muscle Weakness and Swelling of Extremities. Neurological Present- Decreased Memory and Numbness. Not Present- Fainting, Headaches, Seizures, Tingling, Tremor, Trouble walking and Weakness. Psychiatric Not Present- Anxiety, Bipolar, Change in Sleep Pattern, Depression, Fearful and Frequent crying. Endocrine Not Present- Cold  Intolerance, Excessive Hunger, Hair Changes, Heat Intolerance, Hot flashes and New Diabetes. Hematology Present- Blood Thinners. Not Present- Easy Bruising, Excessive bleeding, Gland problems, HIV and Persistent Infections.  Vitals ([name] [name]; 07/26/2017 1:39 PM) 07/26/2017 1:39 PM Weight: 255.5 lb Height: 67in Body Surface Area: 2.24 m Body  Mass Index: 40.02 kg/m  Temp.: 98.74F  Pulse: 74 (Regular)  P.OX: 94% (Room air) BP: 148/80 (Sitting, Left Arm, Standard)      Physical Exam ([name] A. [name] MD; 07/26/2017 2:03 PM)  General Note: Alert, cooperative, chronically ill appearing  Integumentary Note: skin is warm and dry. chronic venous stasis discoloration bilateral lower legs. 2cm lipoma on left upper posterior thigh  Head and Neck Note: no mass or thyromegaly  Eye Note: no icterus, extraocular motions intact  ENMT Note: moist mucus membranes, poor dentition  Chest and Lung Exam Note: unlabored respirations, symmetrical air entry  Cardiovascular Note: reg rate and rhythm. Bilateral pitting edema to the lower third of the lower legs with evidence of venous stasis  Abdomen Note: obese, nontender, nondistended  Neurologic Note: grossly intact, normal gait  Neuropsychiatric Note: normal mood and affect, appropriate insight  Musculoskeletal Note: strength symmetrical throughout, no deformity    Assessment & Plan ([name] A. John Wartman MD; 07/26/2017 2:03 PM)  LIPOMA (D17.9) Story: 2 cm lipoma which is symptomatic in the posterior upper left thigh. He desires excision. I discussed with him outpatient surgery with mild sedation. Discussed risks of bleeding and infection, recurrence of lipoma. He expressed understanding and desires to proceed.

## 2017-07-26 NOTE — H&P (View-Only) (Signed)
[name] 07/26/2017 1:38 PM Location: [facility] Patient #: [id number] DOB: February 03, 1946 Married / Language: [name] / Race: White Male  History of Present Illness (John Farabee A. [name] MD; 07/26/2017 2:00 PM) Patient words: Increasingly uncomfortable lipoma to left upper posterior thigh. Has been to PCP and derm, and to the ER twice. Desires excision. It hurts to sit and when clothes rub on it. NO prior procedures here, no history of drainage or infection. Denies chest pain or shortness of breath. Does note some left hip pain. Significant comorbidities including diabetes, OSA, cirrhosis, CHF, CAD s/p CABG a few years ago.  The patient is a 71 year old male.   Past Surgical History [name], [name]; 07/26/2017 1:38 PM) Bypass Surgery for Poor Blood Flow to Legs Coronary Artery Bypass Graft  Diagnostic Studies History [name], CMA; 07/26/2017 1:38 PM) Colonoscopy within last year  Allergies [name], [name]; 07/26/2017 1:39 PM) No Known Drug Allergies 07/26/2017  Medication History [name], CMA; 07/26/2017 1:41 PM) Atorvastatin Calcium (40MG  Tablet, Oral) Active. Furosemide (40MG  Tablet, Oral) Active. Gabapentin (100MG  Capsule, Oral) Active. Losartan Potassium (50MG  Tablet, Oral) Active. MetFORMIN HCl (500MG  Tablet, Oral) Active. Potassium Chloride Crys ER (20MEQ Tablet ER, Oral) Active. Iron (325 (65 Fe)MG Tablet, Oral) Active. RaNITidine HCl (150MG  Tablet, Oral) Active. Medications Reconciled  Social History [name], CMA; 07/26/2017 1:38 PM) Alcohol use Occasional alcohol use. Caffeine use Carbonated beverages, Coffee, Tea. No drug use Tobacco use Current every day smoker.  Family History [name], [place]; 07/26/2017 1:38 PM) Alcohol Abuse Brother, Father. Arthritis Father. Heart Disease Family Members In General, Mother. Heart disease in male family member before age 23 Hypertension Son.  Other Problems [name], CMA; 07/26/2017 1:38 PM) Asthma Back Pain Chest pain Congestive Heart Failure Diabetes Mellitus High blood pressure Hypercholesterolemia Sleep Apnea Transfusion history     Review of Systems ([name] CMA; 07/26/2017 1:38 PM) General Present- Fatigue. Not Present- Appetite Loss, Chills, Fever, Night Sweats, Weight Gain and Weight Loss. Skin Present- Change in Wart/Mole and Dryness. Not Present- Hives, Jaundice, New Lesions, Non-Healing Wounds, Rash and Ulcer. HEENT Present- Hearing Loss and Ringing in the Ears. Not Present- Earache, Hoarseness, Nose Bleed, Oral Ulcers, Seasonal Allergies, Sinus Pain, Sore Throat, Visual Disturbances, Wears glasses/contact lenses and Yellow Eyes. Respiratory Present- Difficulty Breathing, Snoring and Wheezing. Not Present- Bloody sputum and Chronic Cough. Breast Not Present- Breast Mass, Breast Pain, Nipple Discharge and Skin Changes. Cardiovascular Present- Leg Cramps and Swelling of Extremities. Not Present- Chest Pain, Difficulty Breathing Lying Down, Palpitations, Rapid Heart Rate and Shortness of Breath. Gastrointestinal Present- Excessive gas. Not Present- Abdominal Pain, Bloating, Bloody Stool, Change in Bowel Habits, Chronic diarrhea, Constipation, Difficulty Swallowing, Gets full quickly at meals, Hemorrhoids, Indigestion, Nausea, Rectal Pain and Vomiting. Male Genitourinary Present- Urine Leakage. Not Present- Blood in Urine, Change in Urinary Stream, Frequency, Impotence, Nocturia, Painful Urination and Urgency. Musculoskeletal Present- Back Pain. Not Present- Joint Pain, Joint Stiffness, Muscle Pain, Muscle Weakness and Swelling of Extremities. Neurological Present- Decreased Memory and Numbness. Not Present- Fainting, Headaches, Seizures, Tingling, Tremor, Trouble walking and Weakness. Psychiatric Not Present- Anxiety, Bipolar, Change in Sleep Pattern, Depression, Fearful and Frequent crying. Endocrine Not Present- Cold  Intolerance, Excessive Hunger, Hair Changes, Heat Intolerance, Hot flashes and New Diabetes. Hematology Present- Blood Thinners. Not Present- Easy Bruising, Excessive bleeding, Gland problems, HIV and Persistent Infections.  Vitals ([name] CMA; 07/26/2017 1:39 PM) 07/26/2017 1:39 PM Weight: 255.5 lb Height: 67in Body Surface Area: 2.24 m Body  Mass Index: 40.02 kg/m  Temp.: 98.19F  Pulse: 74 (Regular)  P.OX: 94% (Room air) BP: 148/80 (Sitting, Left Arm, Standard)      Physical Exam (John Decuir A. [name] MD; 07/26/2017 2:03 PM)  General Note: Alert, cooperative, chronically ill appearing  Integumentary Note: skin is warm and dry. chronic venous stasis discoloration bilateral lower legs. 2cm lipoma on left upper posterior thigh  Head and Neck Note: no mass or thyromegaly  Eye Note: no icterus, extraocular motions intact  ENMT Note: moist mucus membranes, poor dentition  Chest and Lung Exam Note: unlabored respirations, symmetrical air entry  Cardiovascular Note: reg rate and rhythm. Bilateral pitting edema to the lower third of the lower legs with evidence of venous stasis  Abdomen Note: obese, nontender, nondistended  Neurologic Note: grossly intact, normal gait  Neuropsychiatric Note: normal mood and affect, appropriate insight  Musculoskeletal Note: strength symmetrical throughout, no deformity    Assessment & Plan (John Magnan A. [name] MD; 07/26/2017 2:03 PM)  LIPOMA (D17.9) Story: 2 cm lipoma which is symptomatic in the posterior upper left thigh. He desires excision. I discussed with him outpatient surgery with mild sedation. Discussed risks of bleeding and infection, recurrence of lipoma. He expressed understanding and desires to proceed.

## 2017-08-24 ENCOUNTER — Encounter (HOSPITAL_COMMUNITY): Payer: Self-pay | Admitting: *Deleted

## 2017-08-24 ENCOUNTER — Other Ambulatory Visit: Payer: Self-pay

## 2017-08-24 MED ORDER — CEFAZOLIN SODIUM-DEXTROSE 2-4 GM/100ML-% IV SOLN
2.0000 g | INTRAVENOUS | Status: AC
  Administered 2017-08-25: 2 g via INTRAVENOUS
  Filled 2017-08-24: qty 100

## 2017-08-24 NOTE — Progress Notes (Addendum)
Anesthesia Chart Review:  Pt is a same day work up.   Patient is a 71 year old male scheduled for left posterior thigh lipoma excision on 08/17/2017 with Romana Juniper, M.D.  - PCP is Pricilla Holm, M.D. last office visit 04/17/17 with Wilfred Lacy, NP  - Was seeing cardiologist Mertie Moores, M.D. for CAD follow-up. Last office visit 12/08/15; 1 year follow-up recommended, but did not occur   PMH includes: CAD (s/p CABG 03/31/14; DES to LAD 2013), CHF, HTN, DM, hyperlipidemia, OSA, hepatic cirrhosis, asthma, iron deficiency anemia, GERD. Former smoker.  Medications include: ASA 81 mg (pt ran out of ASA and stopped taking it an unknown time ago), Lipitor, iron, Lasix, losartan, metformin, potassium, Zantac. Patient to bring med list day of surgery to verify  Labs to be obtained day of surgery  EKG to be obtained day of surgery  TEE 03/31/14:  Post-bypass findings: 1. Aortic valve: The aortic valve was unchanged from the pre-bypass study. The leaflets opened normally and there was trace aortic insufficiency.  2. Mitral valve: The mitral leaflets coapted normally and there was trace mitral insufficiency.  3. The left ventricle: There was some degree of dyssynchronous contractility due to ventricular pacing. However there were no regional wall motion abnormalities and the ejection fraction was estimated at 55%.  4. Right ventricle: The right ventricular cavity was of normal limits in size and there was normal appearing right ventricular function.  6. Tricuspid valve: There was trace tricuspid insufficiency which appeared unchanged from the pre-bypass study.   Pt will need further assessment day of surgery by assigned anesthesiologist. If no acute CV symptoms, and labs and EKG acceptable, I anticipate patient can proceed as scheduled.  Willeen Cass, FNP-BC [facility]/Anesthesiology Phone: [phone] 08/24/2017 11:13 AM

## 2017-08-24 NOTE — Progress Notes (Signed)
Pt SDW-Pre-op call completed by both pt and pt son, [name], with verbal consent from pt.  Pt denies any acute cardiopulmonary issues. Pt under the care of Dr. Acie Fredrickson, Cardiology. [name] denies that pt has had an EKG and chest x ray within the last year. [name] denies recent labs. [name] made aware to have pt not take Metformin the DOS. [name] made aware to have pt check BG every 2 hours prior to arrival to hospital on DOS. [name] made aware to treat a BG < 70 with  4 ounces of apple or cranberry juice, wait 15 minutes after drinking juice to recheck BG, if BG remains < 70, call Short Stay unit to speak with a nurse. [name] stated that pt has not been taking Aspirin because " he is out of them." [name] made aware to have pt stop taking vitamins, fish oil and herbal medications. Do not take any NSAIDs ie: Ibuprofen, Advil, Naproxen (Aleve), Motrin, BC and Goody Powder. [name] verbalized understanding of all pre-op instructions. Anesthesia asked to review pt history (see note).

## 2017-08-24 NOTE — Progress Notes (Signed)
Pt son, [name], educated on the importance for pt to take Aspirin as instructed.

## 2017-08-25 ENCOUNTER — Encounter (HOSPITAL_COMMUNITY): Payer: Self-pay

## 2017-08-25 ENCOUNTER — Encounter (HOSPITAL_COMMUNITY)
Admission: RE | Disposition: A | Discharge status: Home / Self Care | Payer: Self-pay | Source: Ambulatory Visit | Attending: Surgery

## 2017-08-25 ENCOUNTER — Ambulatory Visit (HOSPITAL_COMMUNITY)
Admission: RE | Admit: 2017-08-25 | Discharge: 2017-08-25 | Disposition: A | Discharge status: Home / Self Care | Payer: PPO | Source: Ambulatory Visit | Attending: Surgery | Admitting: Surgery

## 2017-08-25 ENCOUNTER — Ambulatory Visit (HOSPITAL_COMMUNITY): Payer: PPO | Admitting: Emergency Medicine

## 2017-08-25 DIAGNOSIS — Z8249 Family history of ischemic heart disease and other diseases of the circulatory system: Secondary | ICD-10-CM | POA: Insufficient documentation

## 2017-08-25 DIAGNOSIS — M109 Gout, unspecified: Secondary | ICD-10-CM | POA: Insufficient documentation

## 2017-08-25 DIAGNOSIS — D1800 Hemangioma unspecified site: Secondary | ICD-10-CM | POA: Diagnosis present

## 2017-08-25 DIAGNOSIS — R079 Chest pain, unspecified: Secondary | ICD-10-CM | POA: Insufficient documentation

## 2017-08-25 DIAGNOSIS — Z811 Family history of alcohol abuse and dependence: Secondary | ICD-10-CM | POA: Diagnosis not present

## 2017-08-25 DIAGNOSIS — G4730 Sleep apnea, unspecified: Secondary | ICD-10-CM | POA: Diagnosis not present

## 2017-08-25 DIAGNOSIS — Z87442 Personal history of urinary calculi: Secondary | ICD-10-CM | POA: Insufficient documentation

## 2017-08-25 DIAGNOSIS — M549 Dorsalgia, unspecified: Secondary | ICD-10-CM | POA: Insufficient documentation

## 2017-08-25 DIAGNOSIS — M1990 Unspecified osteoarthritis, unspecified site: Secondary | ICD-10-CM | POA: Insufficient documentation

## 2017-08-25 DIAGNOSIS — D1801 Hemangioma of skin and subcutaneous tissue: Secondary | ICD-10-CM | POA: Insufficient documentation

## 2017-08-25 DIAGNOSIS — E119 Type 2 diabetes mellitus without complications: Secondary | ICD-10-CM | POA: Diagnosis not present

## 2017-08-25 DIAGNOSIS — Z79899 Other long term (current) drug therapy: Secondary | ICD-10-CM | POA: Insufficient documentation

## 2017-08-25 DIAGNOSIS — F17200 Nicotine dependence, unspecified, uncomplicated: Secondary | ICD-10-CM | POA: Insufficient documentation

## 2017-08-25 DIAGNOSIS — I509 Heart failure, unspecified: Secondary | ICD-10-CM | POA: Insufficient documentation

## 2017-08-25 DIAGNOSIS — K7460 Unspecified cirrhosis of liver: Secondary | ICD-10-CM | POA: Insufficient documentation

## 2017-08-25 DIAGNOSIS — I2510 Atherosclerotic heart disease of native coronary artery without angina pectoris: Secondary | ICD-10-CM | POA: Insufficient documentation

## 2017-08-25 DIAGNOSIS — Z8261 Family history of arthritis: Secondary | ICD-10-CM | POA: Insufficient documentation

## 2017-08-25 DIAGNOSIS — I110 Hypertensive heart disease with heart failure: Secondary | ICD-10-CM | POA: Diagnosis not present

## 2017-08-25 DIAGNOSIS — K219 Gastro-esophageal reflux disease without esophagitis: Secondary | ICD-10-CM | POA: Insufficient documentation

## 2017-08-25 DIAGNOSIS — I4510 Unspecified right bundle-branch block: Secondary | ICD-10-CM | POA: Diagnosis not present

## 2017-08-25 DIAGNOSIS — E7800 Pure hypercholesterolemia, unspecified: Secondary | ICD-10-CM | POA: Diagnosis not present

## 2017-08-25 DIAGNOSIS — Z951 Presence of aortocoronary bypass graft: Secondary | ICD-10-CM | POA: Diagnosis not present

## 2017-08-25 DIAGNOSIS — Z6838 Body mass index (BMI) 38.0-38.9, adult: Secondary | ICD-10-CM | POA: Diagnosis not present

## 2017-08-25 DIAGNOSIS — D1724 Benign lipomatous neoplasm of skin and subcutaneous tissue of left leg: Secondary | ICD-10-CM | POA: Diagnosis not present

## 2017-08-25 DIAGNOSIS — J45909 Unspecified asthma, uncomplicated: Secondary | ICD-10-CM | POA: Insufficient documentation

## 2017-08-25 DIAGNOSIS — Z955 Presence of coronary angioplasty implant and graft: Secondary | ICD-10-CM | POA: Diagnosis not present

## 2017-08-25 HISTORY — PX: LIPOMA EXCISION: SHX5283

## 2017-08-25 HISTORY — DX: Benign lipomatous neoplasm, unspecified: D17.9

## 2017-08-25 HISTORY — DX: Family history of other specified conditions: Z84.89

## 2017-08-25 LAB — CBC WITH DIFFERENTIAL/PLATELET
BASOS ABS: 0 10*3/uL (ref 0.0–0.1)
BASOS PCT: 0 %
EOS PCT: 3 %
Eosinophils Absolute: 0.1 10*3/uL (ref 0.0–0.7)
HCT: 31.6 % — ABNORMAL LOW (ref 39.0–52.0)
Hemoglobin: 9.6 g/dL — ABNORMAL LOW (ref 13.0–17.0)
Lymphocytes Relative: 21 %
Lymphs Abs: 0.9 10*3/uL (ref 0.7–4.0)
MCH: 25.9 pg — ABNORMAL LOW (ref 26.0–34.0)
MCHC: 30.4 g/dL (ref 30.0–36.0)
MCV: 85.2 fL (ref 78.0–100.0)
MONO ABS: 0.3 10*3/uL (ref 0.1–1.0)
Monocytes Relative: 7 %
NEUTROS ABS: 3 10*3/uL (ref 1.7–7.7)
Neutrophils Relative %: 69 %
PLATELETS: 118 10*3/uL — AB (ref 150–400)
RBC: 3.71 MIL/uL — ABNORMAL LOW (ref 4.22–5.81)
RDW: 15.3 % (ref 11.5–15.5)
WBC: 4.4 10*3/uL (ref 4.0–10.5)

## 2017-08-25 LAB — GLUCOSE, CAPILLARY
GLUCOSE-CAPILLARY: 116 mg/dL — AB (ref 65–99)
Glucose-Capillary: 105 mg/dL — ABNORMAL HIGH (ref 65–99)

## 2017-08-25 LAB — COMPREHENSIVE METABOLIC PANEL
ALT: 17 U/L (ref 17–63)
AST: 23 U/L (ref 15–41)
Albumin: 3.6 g/dL (ref 3.5–5.0)
Alkaline Phosphatase: 91 U/L (ref 38–126)
Anion gap: 5 (ref 5–15)
BUN: 20 mg/dL (ref 6–20)
CALCIUM: 8.3 mg/dL — AB (ref 8.9–10.3)
CHLORIDE: 109 mmol/L (ref 101–111)
CO2: 24 mmol/L (ref 22–32)
CREATININE: 1.11 mg/dL (ref 0.61–1.24)
Glucose, Bld: 115 mg/dL — ABNORMAL HIGH (ref 65–99)
Potassium: 4.1 mmol/L (ref 3.5–5.1)
Sodium: 138 mmol/L (ref 135–145)
Total Bilirubin: 0.7 mg/dL (ref 0.3–1.2)
Total Protein: 6.9 g/dL (ref 6.5–8.1)

## 2017-08-25 LAB — HEMOGLOBIN A1C
HEMOGLOBIN A1C: 6.5 % — AB (ref 4.8–5.6)
MEAN PLASMA GLUCOSE: 139.85 mg/dL

## 2017-08-25 SURGERY — EXCISION LIPOMA
Anesthesia: Monitor Anesthesia Care | Laterality: Left

## 2017-08-25 MED ORDER — FENTANYL CITRATE (PF) 100 MCG/2ML IJ SOLN
INTRAMUSCULAR | Status: DC | PRN
  Administered 2017-08-25: 25 ug via INTRAVENOUS
  Administered 2017-08-25: 50 ug via INTRAVENOUS

## 2017-08-25 MED ORDER — FENTANYL CITRATE (PF) 250 MCG/5ML IJ SOLN
INTRAMUSCULAR | Status: AC
  Filled 2017-08-25: qty 5

## 2017-08-25 MED ORDER — OXYCODONE HCL 5 MG PO TABS: 5.0000 mg | ORAL_TABLET | ORAL | Status: DC | PRN

## 2017-08-25 MED ORDER — LIDOCAINE HCL (CARDIAC) 20 MG/ML IV SOLN
INTRAVENOUS | Status: DC | PRN
  Administered 2017-08-25: 60 mg via INTRAVENOUS

## 2017-08-25 MED ORDER — PROPOFOL 500 MG/50ML IV EMUL
INTRAVENOUS | Status: DC | PRN
  Administered 2017-08-25: 50 ug/kg/min via INTRAVENOUS

## 2017-08-25 MED ORDER — ONDANSETRON HCL 4 MG/2ML IJ SOLN
INTRAMUSCULAR | Status: AC
  Filled 2017-08-25: qty 2

## 2017-08-25 MED ORDER — 0.9 % SODIUM CHLORIDE (POUR BTL) OPTIME
TOPICAL | Status: DC | PRN
  Administered 2017-08-25: 1000 mL

## 2017-08-25 MED ORDER — DEXAMETHASONE SODIUM PHOSPHATE 10 MG/ML IJ SOLN
INTRAMUSCULAR | Status: AC
  Filled 2017-08-25: qty 1

## 2017-08-25 MED ORDER — ACETAMINOPHEN 325 MG PO TABS: 650.0000 mg | ORAL_TABLET | ORAL | Status: DC | PRN

## 2017-08-25 MED ORDER — ACETAMINOPHEN 650 MG RE SUPP: 650.0000 mg | RECTAL | Status: DC | PRN

## 2017-08-25 MED ORDER — BUPIVACAINE-EPINEPHRINE (PF) 0.25% -1:200000 IJ SOLN
INTRAMUSCULAR | Status: AC
  Filled 2017-08-25: qty 30

## 2017-08-25 MED ORDER — SODIUM CHLORIDE 0.9% FLUSH: 3.0000 mL | INTRAVENOUS | Status: DC | PRN

## 2017-08-25 MED ORDER — CHLORHEXIDINE GLUCONATE 4 % EX LIQD: 60.0000 mL | Freq: Once | CUTANEOUS | Status: DC

## 2017-08-25 MED ORDER — DOCUSATE SODIUM 100 MG PO CAPS: 100.0000 mg | ORAL_CAPSULE | Freq: Two times a day (BID) | ORAL | 0 refills | Status: DC

## 2017-08-25 MED ORDER — FENTANYL CITRATE (PF) 100 MCG/2ML IJ SOLN: 25.0000 ug | INTRAMUSCULAR | Status: DC | PRN

## 2017-08-25 MED ORDER — LIDOCAINE 2% (20 MG/ML) 5 ML SYRINGE
INTRAMUSCULAR | Status: AC
  Filled 2017-08-25: qty 5

## 2017-08-25 MED ORDER — SODIUM CHLORIDE 0.9% FLUSH: 3.0000 mL | Freq: Two times a day (BID) | INTRAVENOUS | Status: DC

## 2017-08-25 MED ORDER — HYDROCODONE-ACETAMINOPHEN 5-325 MG PO TABS: 1.0000 | ORAL_TABLET | Freq: Four times a day (QID) | ORAL | 0 refills | Status: DC | PRN

## 2017-08-25 MED ORDER — LACTATED RINGERS IV SOLN
INTRAVENOUS | Status: DC
  Administered 2017-08-25: 14:00:00 via INTRAVENOUS

## 2017-08-25 MED ORDER — ONDANSETRON HCL 4 MG/2ML IJ SOLN
4.0000 mg | Freq: Once | INTRAMUSCULAR | Status: AC | PRN
  Administered 2017-08-25: 4 mg via INTRAVENOUS

## 2017-08-25 MED ORDER — SODIUM CHLORIDE 0.9 % IV SOLN: 250.0000 mL | INTRAVENOUS | Status: DC | PRN

## 2017-08-25 SURGICAL SUPPLY — 33 items
CANISTER SUCT 3000ML PPV (MISCELLANEOUS) ×2 IMPLANT
COVER SURGICAL LIGHT HANDLE (MISCELLANEOUS) ×2 IMPLANT
DERMABOND ADVANCED (GAUZE/BANDAGES/DRESSINGS) ×1
DERMABOND ADVANCED .7 DNX12 (GAUZE/BANDAGES/DRESSINGS) ×1 IMPLANT
DRAPE LAPAROTOMY 100X72 PEDS (DRAPES) ×2 IMPLANT
DRAPE UTILITY XL STRL (DRAPES) ×2 IMPLANT
ELECT CAUTERY BLADE 6.4 (BLADE) ×2 IMPLANT
ELECT REM PT RETURN 9FT ADLT (ELECTROSURGICAL) ×2
ELECTRODE REM PT RTRN 9FT ADLT (ELECTROSURGICAL) ×1 IMPLANT
GAUZE SPONGE 4X4 12PLY STRL LF (GAUZE/BANDAGES/DRESSINGS) ×2 IMPLANT
GLOVE BIO SURGEON STRL SZ 6 (GLOVE) ×2 IMPLANT
GLOVE BIOGEL PI IND STRL 6.5 (GLOVE) ×1 IMPLANT
GLOVE BIOGEL PI INDICATOR 6.5 (GLOVE) ×1
GOWN STRL REUS W/ TWL LRG LVL3 (GOWN DISPOSABLE) ×3 IMPLANT
GOWN STRL REUS W/TWL LRG LVL3 (GOWN DISPOSABLE) ×3
KIT BASIN OR (CUSTOM PROCEDURE TRAY) ×2 IMPLANT
KIT ROOM TURNOVER OR (KITS) ×2 IMPLANT
NEEDLE HYPO 25GX1X1/2 BEV (NEEDLE) ×2 IMPLANT
NS IRRIG 1000ML POUR BTL (IV SOLUTION) ×2 IMPLANT
PACK SURGICAL SETUP 50X90 (CUSTOM PROCEDURE TRAY) ×2 IMPLANT
PAD ARMBOARD 7.5X6 YLW CONV (MISCELLANEOUS) ×2 IMPLANT
PENCIL BUTTON HOLSTER BLD 10FT (ELECTRODE) ×2 IMPLANT
SPECIMEN JAR MEDIUM (MISCELLANEOUS) ×2 IMPLANT
SPONGE LAP 18X18 X RAY DECT (DISPOSABLE) ×2 IMPLANT
SUT MNCRL AB 4-0 PS2 18 (SUTURE) ×2 IMPLANT
SUT VIC AB 3-0 SH 27 (SUTURE) ×1
SUT VIC AB 3-0 SH 27XBRD (SUTURE) ×1 IMPLANT
SYR BULB 3OZ (MISCELLANEOUS) ×2 IMPLANT
SYR CONTROL 10ML LL (SYRINGE) ×2 IMPLANT
TOWEL GREEN STERILE FF (TOWEL DISPOSABLE) ×2 IMPLANT
TUBE CONNECTING 12X1/4 (SUCTIONS) ×2 IMPLANT
TUBE CONNECTING 20X1/4 (TUBING) ×2 IMPLANT
YANKAUER SUCT BULB TIP NO VENT (SUCTIONS) ×2 IMPLANT

## 2017-08-25 NOTE — Transfer of Care (Signed)
Immediate Anesthesia Transfer of Care Note  Patient: [name]  Procedure(s) Performed: EXCISION LIPOMA LEFT POSTERIOR THIGH (Left )  Patient Location: PACU  Anesthesia Type:MAC  Level of Consciousness: awake, alert  and oriented  Airway & Oxygen Therapy: Patient Spontanous Breathing and Patient connected to face mask oxygen  Post-op Assessment: Report given to RN and Post -op Vital signs reviewed and stable  Post vital signs: Reviewed and stable  Last Vitals:  Vitals:   08/25/17 1323 08/25/17 1508  BP: (!) 178/83 (P) 128/83  Pulse:    Resp:  (P) 17  Temp:  (P) 36.9 C  SpO2:      Last Pain:  Vitals:   08/25/17 1508  TempSrc:   PainSc: (P) 0-No pain         Complications: No apparent anesthesia complications

## 2017-08-25 NOTE — Discharge Instructions (Signed)
GENERAL SURGERY: POST OP INSTRUCTIONS  ######################################################################  EAT Gradually transition to a high fiber diet with a fiber supplement over the next few weeks after discharge.  Start with a pureed / full liquid diet (see below)  WALK Walk an hour a day.  Control your pain to do that.    CONTROL PAIN Control pain so that you can walk, sleep, tolerate sneezing/coughing, go up/down stairs.  HAVE A BOWEL MOVEMENT DAILY Keep your bowels regular to avoid problems.  OK to try a laxative to override constipation.  OK to use an antidairrheal to slow down diarrhea.  Call if not better after 2 tries  CALL IF YOU HAVE PROBLEMS/CONCERNS Call if you are still struggling despite following these instructions. Call if you have concerns not answered by these instructions  ######################################################################    1. DIET: Follow a light bland diet the first 24 hours after arrival home, such as soup, liquids, crackers, etc.  Be sure to include lots of fluids daily.  Avoid fast food or heavy meals as your are more likely to get nauseated.   2. Take your usually prescribed home medications unless otherwise directed. 3. PAIN CONTROL: a. Pain is best controlled by a usual combination of three different methods TOGETHER: i. Ice/Heat ii. Over the counter pain medication iii. Prescription pain medication b. Most patients will experience some swelling and bruising around the incisions.  Ice packs or heating pads (30-60 minutes up to 6 times a day) will help. Use ice for the first few days to help decrease swelling and bruising, then switch to heat to help relax tight/sore spots and speed recovery.  Some people prefer to use ice alone, heat alone, alternating between ice & heat.  Experiment to what works for you.  Swelling and bruising can take several weeks to resolve.   c. It is helpful to take an over-the-counter pain medication  regularly for the first few weeks.  Choose one of the following that works best for you: i. Naproxen (Aleve, etc)  Two 220mg  tabs twice a day ii. Ibuprofen (Advil, etc) Three 200mg  tabs four times a day (every meal & bedtime) iii. Acetaminophen (Tylenol, etc) 500-650mg  four times a day (every meal & bedtime) d. A  prescription for pain medication (such as oxycodone, hydrocodone, etc) should be given to you upon discharge.  Take your pain medication as prescribed.  i. If you are having problems/concerns with the prescription medicine (does not control pain, nausea, vomiting, rash, itching, etc), please call us [phone] to see if we need to switch you to a different pain medicine that will work better for you and/or control your side effect better. ii. If you need a refill on your pain medication, please contact your pharmacy.  They will contact our office to request authorization. Prescriptions will not be filled after 5 pm or on week-ends. 4. Avoid getting constipated.  Between the surgery and the pain medications, it is common to experience some constipation.  Increasing fluid intake and taking a fiber supplement (such as Metamucil, Citrucel, FiberCon, MiraLax, etc) 1-2 times a day regularly will usually help prevent this problem from occurring.  A mild laxative (prune juice, Milk of Magnesia, MiraLax, etc) should be taken according to package directions if there are no bowel movements after 48 hours.   5. Wash / shower every day.  You may shower over the skin glue which is waterproof.  Continue to shower over incision(s) after the dressing is off. 6. Skin glue will flake  off after about 2 weeks.  You may leave the incision open to air.  You may have skin tapes (Steri Strips) covering the incision(s).  Leave them on until one week, then remove.  You may replace a dressing/Band-Aid to cover the incision for comfort if you wish.    7. ACTIVITIES as tolerated:   a. You may resume regular (light)  daily activities beginning the next day--such as daily self-care, walking, climbing stairs--gradually increasing activities as tolerated.  If you can walk 30 minutes without difficulty, it is safe to try more intense activity such as jogging, treadmill, bicycling, low-impact aerobics, swimming, etc. b. Save the most intensive and strenuous activity for last such as sit-ups, heavy lifting, contact sports, etc  Refrain from any heavy lifting or straining until you are off narcotics for pain control.   c. DO NOT PUSH THROUGH PAIN.  Let pain be your guide: If it hurts to do something, don't do it.  Pain is your body warning you to avoid that activity for another week until the pain goes down. d. You may drive when you are no longer taking prescription pain medication, you can comfortably wear a seatbelt, and you can safely maneuver your car and apply brakes. e. Dennis Bast may have sexual intercourse when it is comfortable.  8. FOLLOW UP in our office a. Please call [facility] at [phone] to set up an appointment to see your surgeon in the office for a follow-up appointment approximately 2-3 weeks after your surgery. b. Make sure that you call for this appointment the day you arrive home to insure a convenient appointment time. 9. IF YOU HAVE DISABILITY OR FAMILY LEAVE FORMS, BRING THEM TO THE OFFICE FOR PROCESSING.  DO NOT GIVE THEM TO YOUR DOCTOR.   WHEN TO CALL us [phone]: 1. Poor pain control 2. Reactions / problems with new medications (rash/itching, nausea, etc)  3. Fever over 101.5 F (38.5 C) 4. Worsening swelling or bruising 5. Continued bleeding from incision. 6. Increased pain, redness, or drainage from the incision 7. Difficulty breathing / swallowing   The clinic staff is available to answer your questions during regular business hours (8:30am-5pm).  Please dont hesitate to call and ask to speak to one of our nurses for clinical concerns.   If you have a medical emergency, go to the  nearest emergency room or call 911.  A surgeon from [facility] is always on call at the [facility], [place], [place], [place], [place]  [address] ? MAIN: [phone] ? TOLL FREE: [phone] ?  FAX [phone] [email or web address]

## 2017-08-25 NOTE — Interval H&P Note (Signed)
History and Physical Interval Note:  08/25/2017 1:12 PM  [name]  has presented today for surgery, with the diagnosis of LIPOMA  The various methods of treatment have been discussed with the patient and family. After consideration of risks, benefits and other options for treatment, the patient has consented to  Procedure(s): EXCISION LIPOMA LEFT POSTERIOR THIGH (Left) as a surgical intervention .  The patient's history has been reviewed, patient examined, no change in status, stable for surgery.  I have reviewed the patient's chart and labs.  Questions were answered to the patient's satisfaction.     Roshanna Cimino Rich Brave

## 2017-08-25 NOTE — Anesthesia Preprocedure Evaluation (Addendum)
Anesthesia Evaluation  Patient identified by MRN, date of birth, ID band Patient awake    Reviewed: Allergy & Precautions, H&P , NPO status , Patient's Chart, lab work & pertinent test results  Airway Mallampati: III  TM Distance: >3 FB Neck ROM: Full    Dental  (+) Edentulous Upper, Edentulous Lower   Pulmonary asthma , sleep apnea , former smoker,    breath sounds clear to auscultation       Cardiovascular hypertension, Pt. on medications + CAD, + Cardiac Stents, + CABG and +CHF   Rhythm:Regular Rate:Normal     Neuro/Psych negative neurological ROS  negative psych ROS   GI/Hepatic GERD  ,(+) Cirrhosis       ,   Endo/Other  diabetes, Type 2, Oral Hypoglycemic AgentsMorbid obesity  Renal/GU Nephrolithiasis     Musculoskeletal  (+) Arthritis , Gout   Abdominal   Peds  Hematology   Anesthesia Other Findings   Reproductive/Obstetrics                            Anesthesia Physical  Anesthesia Plan  ASA: III  Anesthesia Plan: MAC   Post-op Pain Management:    Induction: Intravenous  PONV Risk Score and Plan: Treatment may vary due to age or medical condition and Propofol infusion  Airway Management Planned: Simple Face Mask  Additional Equipment: None  Intra-op Plan:   Post-operative Plan:   Informed Consent: I have reviewed the patients History and Physical, chart, labs and discussed the procedure including the risks, benefits and alternatives for the proposed anesthesia with the patient or authorized representative who has indicated his/her understanding and acceptance.   Dental advisory given  Plan Discussed with: CRNA and Surgeon  Anesthesia Plan Comments:         Anesthesia Quick Evaluation

## 2017-08-25 NOTE — Op Note (Signed)
Operative Note  [name]  [id number]  [id number]  08/25/2017   Surgeon: Vikki Ports A ConnorMD  Assistant: OR staff  Procedure performed: excision of 3xm subcutaneous cyst left posterior thigh   Preop diagnosis: left posterior thigh lipoma  Post-op diagnosis/intraop findings: left posterior thigh cyst, likely hemangioma  Specimens: left posterior thigh cyst Retained items: no EBL: minimal cc Complications: none  Description of procedure: After obtaining informed consent the patient was taken to the operating room and placed in the lateral position on operating room table [facility] was initiated, preoperative antibiotics were administered, SCDs applied, and a formal timeout was performed. The skin overlying the cyst was prepped and draped in the usual sterile fashion. After infiltration with local, an elliptical incision was made sharply over the cyst and cautery and blunt dissection used to dissect out the specimen from the underlying subcutaneous specimen. The lesion was multilobulated and filled with blood consistent with a hemangioma.  Hemostasis was ensured in the wound which was then closed with interrupted 3-0 vicryl deep dermal sutures and running subcuticular monocryl. A dry dressing was applied. The patient was then awakened and taken to PACU in stable condition.   All counts were correct at the completion of the case.

## 2017-08-25 NOTE — Anesthesia Postprocedure Evaluation (Signed)
Anesthesia Post Note  Patient: [name]  Procedure(s) Performed: EXCISION LIPOMA LEFT POSTERIOR THIGH (Left )     Patient location during evaluation: PACU Anesthesia Type: MAC Level of consciousness: awake and alert Pain management: pain level controlled Vital Signs Assessment: post-procedure vital signs reviewed and stable Respiratory status: spontaneous breathing, nonlabored ventilation and respiratory function stable Cardiovascular status: stable and blood pressure returned to baseline Anesthetic complications: no    Last Vitals:  Vitals:   08/25/17 1515 08/25/17 1545  BP: 132/66 119/64  Pulse: 71 68  Resp: 16 16  Temp: 36.9 C 36.9 C  SpO2: 97% 97%    Last Pain:  Vitals:   08/25/17 1545  TempSrc:   PainSc: 0-No pain                 Audry Pili

## 2017-08-26 ENCOUNTER — Encounter (HOSPITAL_COMMUNITY): Payer: Self-pay | Admitting: Surgery

## 2017-09-02 ENCOUNTER — Emergency Department (HOSPITAL_COMMUNITY): Payer: PPO

## 2017-09-02 ENCOUNTER — Encounter (HOSPITAL_COMMUNITY): Payer: Self-pay | Admitting: *Deleted

## 2017-09-02 ENCOUNTER — Emergency Department (HOSPITAL_COMMUNITY)
Admission: EM | Admit: 2017-09-02 | Discharge: 2017-09-02 | Disposition: A | Discharge status: Home / Self Care | Payer: PPO | Attending: Emergency Medicine | Admitting: Emergency Medicine

## 2017-09-02 ENCOUNTER — Other Ambulatory Visit: Payer: Self-pay

## 2017-09-02 DIAGNOSIS — R0602 Shortness of breath: Secondary | ICD-10-CM | POA: Diagnosis not present

## 2017-09-02 DIAGNOSIS — R609 Edema, unspecified: Secondary | ICD-10-CM

## 2017-09-02 DIAGNOSIS — Z955 Presence of coronary angioplasty implant and graft: Secondary | ICD-10-CM | POA: Insufficient documentation

## 2017-09-02 DIAGNOSIS — R600 Localized edema: Secondary | ICD-10-CM | POA: Diagnosis not present

## 2017-09-02 DIAGNOSIS — R079 Chest pain, unspecified: Secondary | ICD-10-CM | POA: Diagnosis not present

## 2017-09-02 DIAGNOSIS — Z7982 Long term (current) use of aspirin: Secondary | ICD-10-CM | POA: Diagnosis not present

## 2017-09-02 DIAGNOSIS — E119 Type 2 diabetes mellitus without complications: Secondary | ICD-10-CM | POA: Diagnosis not present

## 2017-09-02 DIAGNOSIS — I2510 Atherosclerotic heart disease of native coronary artery without angina pectoris: Secondary | ICD-10-CM | POA: Diagnosis not present

## 2017-09-02 DIAGNOSIS — Z79899 Other long term (current) drug therapy: Secondary | ICD-10-CM | POA: Insufficient documentation

## 2017-09-02 DIAGNOSIS — I10 Essential (primary) hypertension: Secondary | ICD-10-CM | POA: Insufficient documentation

## 2017-09-02 DIAGNOSIS — Z7984 Long term (current) use of oral hypoglycemic drugs: Secondary | ICD-10-CM | POA: Diagnosis not present

## 2017-09-02 DIAGNOSIS — R05 Cough: Secondary | ICD-10-CM | POA: Diagnosis not present

## 2017-09-02 DIAGNOSIS — Z951 Presence of aortocoronary bypass graft: Secondary | ICD-10-CM | POA: Insufficient documentation

## 2017-09-02 DIAGNOSIS — J40 Bronchitis, not specified as acute or chronic: Secondary | ICD-10-CM | POA: Diagnosis not present

## 2017-09-02 DIAGNOSIS — Z87891 Personal history of nicotine dependence: Secondary | ICD-10-CM | POA: Insufficient documentation

## 2017-09-02 LAB — BASIC METABOLIC PANEL
Anion gap: 9 (ref 5–15)
BUN: 23 mg/dL — AB (ref 6–20)
CALCIUM: 8.3 mg/dL — AB (ref 8.9–10.3)
CHLORIDE: 105 mmol/L (ref 101–111)
CO2: 23 mmol/L (ref 22–32)
CREATININE: 1.14 mg/dL (ref 0.61–1.24)
GFR calc Af Amer: >60 mL/min (ref >60)
Glucose, Bld: 111 mg/dL — ABNORMAL HIGH (ref 65–99)
POTASSIUM: 4 mmol/L (ref 3.5–5.1)
SODIUM: 137 mmol/L (ref 135–145)

## 2017-09-02 LAB — CBC
HCT: 30.7 % — ABNORMAL LOW (ref 39.0–52.0)
Hemoglobin: 9.2 g/dL — ABNORMAL LOW (ref 13.0–17.0)
MCH: 25.8 pg — ABNORMAL LOW (ref 26.0–34.0)
MCHC: 30 g/dL (ref 30.0–36.0)
MCV: 86.2 fL (ref 78.0–100.0)
PLATELETS: 135 10*3/uL — AB (ref 150–400)
RBC: 3.56 MIL/uL — ABNORMAL LOW (ref 4.22–5.81)
RDW: 16 % — ABNORMAL HIGH (ref 11.5–15.5)
WBC: 6.1 10*3/uL (ref 4.0–10.5)

## 2017-09-02 LAB — I-STAT TROPONIN, ED: TROPONIN I, POC: 0.01 ng/mL (ref 0.00–0.08)

## 2017-09-02 LAB — BRAIN NATRIURETIC PEPTIDE: B Natriuretic Peptide: 197.2 pg/mL — ABNORMAL HIGH (ref 0.0–100.0)

## 2017-09-02 MED ORDER — ALBUTEROL (5 MG/ML) CONTINUOUS INHALATION SOLN
10.0000 mg/h | INHALATION_SOLUTION | RESPIRATORY_TRACT | Status: DC
  Administered 2017-09-02: 10 mg/h via RESPIRATORY_TRACT
  Filled 2017-09-02 (×2): qty 20

## 2017-09-02 MED ORDER — ALBUTEROL SULFATE HFA 108 (90 BASE) MCG/ACT IN AERS
2.0000 | INHALATION_SPRAY | Freq: Once | RESPIRATORY_TRACT | Status: AC
  Administered 2017-09-02: 2 via RESPIRATORY_TRACT
  Filled 2017-09-02: qty 6.7

## 2017-09-02 MED ORDER — FUROSEMIDE 10 MG/ML IJ SOLN
40.0000 mg | Freq: Once | INTRAMUSCULAR | Status: AC
  Administered 2017-09-02: 40 mg via INTRAVENOUS
  Filled 2017-09-02: qty 4

## 2017-09-02 MED ORDER — PREDNISONE 20 MG PO TABS
20.0000 mg | ORAL_TABLET | Freq: Once | ORAL | Status: AC
Start: 2017-09-02 — End: 2017-09-02
  Administered 2017-09-02: 20 mg via ORAL
  Filled 2017-09-02: qty 1

## 2017-09-02 MED ORDER — PREDNISONE 10 MG PO TABS: 20.0000 mg | ORAL_TABLET | Freq: Every day | ORAL | 0 refills | Status: AC

## 2017-09-02 MED ORDER — IPRATROPIUM BROMIDE 0.02 % IN SOLN
0.5000 mg | Freq: Once | RESPIRATORY_TRACT | Status: AC
  Administered 2017-09-02: 0.5 mg via RESPIRATORY_TRACT
  Filled 2017-09-02: qty 2.5

## 2017-09-02 NOTE — ED Notes (Signed)
Ambulated in hall.  O2 sat remains between 93-95% on room air.  Patient reports feeling much better than on arrival.

## 2017-09-02 NOTE — ED Notes (Signed)
Pt verbalized understanding of d/c instructions and has no further questions. VSS, NAD. Pt educated upon use of albuterol inhaler and prednisone. Pt removed all belongings from room.

## 2017-09-02 NOTE — ED Provider Notes (Signed)
[facility] EMERGENCY DEPARTMENT Provider Note  CSN: [id number] Arrival date & time: 09/02/17 1546  Chief Complaint(s) Shortness of Breath and Leg Swelling  HPI [name] is a 72 y.o. male   The history is provided by the patient.  Shortness of Breath  This is a recurrent problem. The average episode lasts 3 days. The problem occurs continuously.The problem has been gradually worsening. Associated symptoms include cough, wheezing, chest pain (with coughing) and leg swelling (worse for 3-5 days). Pertinent negatives include no fever, no headaches, no rhinorrhea, no sputum production and no hemoptysis. Associated medical issues include asthma and heart failure.   He reports that he has had some sick contacts at home with URI symptoms earlier this week.  Denies any known fevers or chills.  No nausea, vomiting, abdominal pain.  Past Medical History Past Medical History:  Diagnosis Date  . Arthritis    "joints tighten up sometimes" (03/27/2104)  . Asthma   . CHF (congestive heart failure) ([place])   . Chronic low back pain   . Chronic lower back pain   . Coronary artery disease    a. 05/2012 Cath/PCI: LM 40ost, 50-60d, LAD 99p ruptured plaque (3.0x28 DES), LCX 50p/m, RCA 30-40p, 6m, EF 65-70%.  . Family history of adverse reaction to anesthesia    Pt granddaughter had PONV  . GERD (gastroesophageal reflux disease)   . Hepatic cirrhosis ([place])    a. Dx 01/2014 - CT a/p   . History of blood transfusion    "related to bleeding ulcers"  . History of concussion    1976--  NO RESIDUAL  . History of GI bleed    a. UGIB 07/2012;  b. 01/2014 admission with GIB/FOB stool req 1U prbc's->EGD showed portal gastropathy, barrett's esoph, and chronic active h. pylori gastritis.  Marland Kitchen History of gout    2007 &  2008  LEFT LEG-- NO ISSUE SINCE  . Hyperlipidemia   . Iron deficiency anemia   . Kidney stones   . Lipoma   . OA (osteoarthritis of spine)    LOWER BACK--  INTERMITTANT  LEFT LEG NUMBNESS  . OSA (obstructive sleep apnea)    PULMOLOGIST-  DR CLANCE--  MODERATE OSA  STARTED CPAP 2012--  BUT CURRENTLY HAS NOT USED PAST 6 MONTHS  . Phimosis    a. s/p circumcision 2015.  . Type 2 diabetes mellitus ([place])   . Unspecified essential hypertension    Patient Active Problem List   Diagnosis Date Noted  . Subcutaneous cyst 04/17/2017  . Morbid obesity ([place]) 02/05/2016  . Numerous moles 09/15/2015  . S/P CABG x 3 03/31/2014  . History of GI bleed   . Unstable angina ([place]) 03/27/2014  . Cirrhosis of liver without mention of alcohol 02/27/2014  . Hyperlipidemia with target LDL less than 70 06/29/2012  . Coronary artery disease    . OSA (obstructive sleep apnea)   . Type 2 diabetes with complication ([place]) 98/92/1194  . GERD 08/04/2010  . History of gout   . Hypertensive heart disease    Home Medication(s) Prior to Admission medications   Medication Sig Start Date End Date Taking? Authorizing Provider  aspirin EC 81 MG tablet Take 1 tablet (81 mg total) by mouth 2 (two) times daily. 06/09/15  Yes Nahser, Wonda Cheng, MD  atorvastatin (LIPITOR) 40 MG tablet TAKE 1 TABLET BY MOUTH DAILY AT 6 PM Patient taking differently: Take 40 mg by mouth daily at 6 PM. TAKE 1 TABLET BY MOUTH  DAILY AT 6 PM 07/04/17  Yes [name], MD  furosemide (LASIX) 40 MG tablet TAKE 1 TABLET BY MOUTH TWICE DAILY Patient taking differently: TAKE 40 mg  TABLET BY MOUTH TWICE DAILY 07/04/17  Yes [name], MD  gabapentin (NEURONTIN) 100 MG capsule TAKE ONE CAPSULE BY MOUTH TWICE DAILY Patient taking differently: TAKE 100 mg CAPSULE BY MOUTH TWICE DAILY 07/04/17  Yes [name], MD  losartan (COZAAR) 50 MG tablet TAKE 1 TABLET BY MOUTH EVERY DAY Patient taking differently: TAKE 1 TABLET (50mg ) BY MOUTH EVERY DAY 07/04/17  Yes [name], MD  metFORMIN (GLUCOPHAGE) 500 MG tablet TAKE 1 TABLET BY MOUTH TWICE DAILY WITH A MEAL Patient taking differently: TAKE  500 mg TABLET BY MOUTH TWICE DAILY WITH A MEAL 07/04/17  Yes [name], MD  potassium chloride SA (K-DUR,KLOR-CON) 20 MEQ tablet TAKE 1 TABLET BY MOUTH EVERY DAY Patient taking differently: TAKE 20 meq TABLET BY MOUTH EVERY DAY 07/04/17  Yes [name], MD  nitroGLYCERIN (NITROSTAT) 0.4 MG SL tablet Place 1 tablet (0.4 mg total) under the tongue every 5 (five) minutes as needed for chest pain. 06/08/16 08/25/17  Nahser, Wonda Cheng, MD  predniSONE (DELTASONE) 10 MG tablet Take 2 tablets (20 mg total) by mouth daily for 4 days. 09/02/17 09/06/17  Fatima Blank, MD                                                                                                                                    Past Surgical History Past Surgical History:  Procedure Laterality Date  . ANKLE FRACTURE SURGERY Right 1989   "plate put in"  . APPENDECTOMY  05-16-2004   open  . CIRCUMCISION N/A 09/09/2013   Procedure: CIRCUMCISION ADULT;  Surgeon: Bernestine Amass, MD;  Location: [facility];  Service: Urology;  Laterality: N/A;  . COLECTOMY  05-16-2004  . CORONARY ANGIOPLASTY WITH STENT PLACEMENT  06/28/2012  DR COOPER   PCI W/  X1 DES to [place]. LAD/  LM  40% OSTIAL & 50-60% DISTAL /  50% PROX LCX/  30-40% PROX RCA & 50% MID RCA/   LVEF 65-70%  . CORONARY ARTERY BYPASS GRAFT N/A 03/31/2014   Procedure: CORONARY ARTERY BYPASS GRAFTING (CABG) times 3 using left internal mammary artery and right saphenous vein.;  Surgeon: Melrose Nakayama, MD;  Location: [place];  Service: Open Heart Surgery;  Laterality: N/A;  . DOBUTAMINE STRESS ECHO  06-08-2012   MODERATE HYPOKINESIS/ ISCHEMIA MID INFERIOR WALL  . ESOPHAGOGASTRODUODENOSCOPY  08/15/2012   Procedure: ESOPHAGOGASTRODUODENOSCOPY (EGD);  Surgeon: Wonda Horner, MD;  Location: [facility] ENDOSCOPY;  Service: Endoscopy;  Laterality: N/A;  . ESOPHAGOGASTRODUODENOSCOPY N/A 02/17/2014   Procedure: ESOPHAGOGASTRODUODENOSCOPY (EGD);  Surgeon: Jeryl Columbia, MD;  Location: [facility] ENDOSCOPY;  Service: Endoscopy;  Laterality: N/A;  . HERNIA REPAIR    . INTRAOPERATIVE TRANSESOPHAGEAL ECHOCARDIOGRAM N/A 03/31/2014  Procedure: INTRAOPERATIVE TRANSESOPHAGEAL ECHOCARDIOGRAM;  Surgeon: Melrose Nakayama, MD;  Location: [place];  Service: Open Heart Surgery;  Laterality: N/A;  . LAPAROSCOPIC UMBILICAL HERNIA REPAIR W/ MESH  06-06-2011  . LEFT HEART CATHETERIZATION WITH CORONARY ANGIOGRAM N/A 03/28/2014   Procedure: LEFT HEART CATHETERIZATION WITH CORONARY ANGIOGRAM;  Surgeon: Sinclair Grooms, MD;  Location: [facility] CATH LAB;  Service: Cardiovascular;  Laterality: N/A;  . LIPOMA EXCISION Left 08/25/2017   Procedure: EXCISION LIPOMA LEFT POSTERIOR THIGH;  Surgeon: Clovis Riley, MD;  Location: [place];  Service: General;  Laterality: Left;  . NEPHROLITHOTOMY  1990'S  . OPEN APPENDECTOMY W/ PARTIAL CECECTOMY  05-16-2004  . PERCUTANEOUS CORONARY STENT INTERVENTION (PCI-S) N/A 06/28/2012   Procedure: PERCUTANEOUS CORONARY STENT INTERVENTION (PCI-S);  Surgeon: Sherren Mocha, MD;  Location: [facility] CATH LAB;  Service: Cardiovascular;  Laterality: N/A;   Family History Family History  Problem Relation Age of Onset  . Lung cancer Sister   . Cancer Sister        lung  . Cancer Mother   . Cancer Father        died in his 41s.  . Cancer Brother        lung  . Coronary artery disease Unknown   . Diabetes Unknown   . Colon cancer Unknown   . Cancer Sister        lung    Social History Social History   Tobacco Use  . Smoking status: Former Smoker    Packs/day: 2.00    Years: 40.00    Pack years: 80.00    Types: Cigarettes    Last attempt to quit: 08/29/1996    Years since quitting: 21.0  . Smokeless tobacco: Never Used  Substance Use Topics  . Alcohol use: Yes    Alcohol/week: 0.0 oz    Comment: occasional  . Drug use: No   Allergies Patient has no known allergies.  Review of Systems Review of Systems  Constitutional: Negative for fever.    HENT: Negative for rhinorrhea.   Respiratory: Positive for cough, shortness of breath and wheezing. Negative for hemoptysis and sputum production.   Cardiovascular: Positive for chest pain (with coughing) and leg swelling (worse for 3-5 days).  Neurological: Negative for headaches.   All other systems are reviewed and are negative for acute change except as noted in the HPI   Physical Exam Vital Signs  I have reviewed the triage vital signs BP (!) 145/57 (BP Location: Right Arm)   Pulse 62   Temp 98 F (36.7 C) (Oral)   Resp 20   SpO2 100%   Physical Exam  Constitutional: He is oriented to person, place, and time. He appears well-developed and well-nourished. No distress.  HENT:  Head: Normocephalic and atraumatic.  Nose: Nose normal.  Eyes: Conjunctivae and EOM are normal. Pupils are equal, round, and reactive to light. Right eye exhibits no discharge. Left eye exhibits no discharge. No scleral icterus.  Neck: Normal range of motion. Neck supple.  Cardiovascular: Normal rate and regular rhythm. Exam reveals no gallop and no friction rub.  No murmur heard. Pulmonary/Chest: Effort normal. No stridor. No respiratory distress. He has wheezes (exp wheezing) in the right upper field, the right middle field, the right lower field, the left upper field, the left middle field and the left lower field. He has no rales.  Abdominal: Soft. He exhibits no distension. There is no tenderness.  Musculoskeletal: He exhibits no edema or tenderness.  3+ BLE edema  Neurological: He is alert and oriented to person, place, and time.  Skin: Skin is warm and dry. No rash noted. He is not diaphoretic. No erythema.  Psychiatric: He has a normal mood and affect.  Vitals reviewed.   ED Results and Treatments Labs (all labs ordered are listed, but only abnormal results are displayed) Labs Reviewed  BASIC METABOLIC PANEL - Abnormal; Notable for the following components:      Result Value   Glucose,  Bld 111 (*)    BUN 23 (*)    Calcium 8.3 (*)    All other components within normal limits  CBC - Abnormal; Notable for the following components:   RBC 3.56 (*)    Hemoglobin 9.2 (*)    HCT 30.7 (*)    MCH 25.8 (*)    RDW 16.0 (*)    Platelets 135 (*)    All other components within normal limits  BRAIN NATRIURETIC PEPTIDE - Abnormal; Notable for the following components:   B Natriuretic Peptide 197.2 (*)    All other components within normal limits  I-STAT TROPONIN, ED                                                                                                                         EKG  EKG Interpretation  Date/Time:  Saturday September 02 2017 15:51:57 EST Ventricular Rate:  64 PR Interval:  174 QRS Duration: 88 QT Interval:  426 QTC Calculation: 439 R Axis:   98 Text Interpretation:  Normal sinus rhythm Rightward axis Borderline ECG Nonspecific T wave abnormality Otherwise no significant change Confirmed by Addison Lank [phone]) on 09/02/2017 7:12:34 PM      Radiology Dg Chest 2 View  Result Date: 09/02/2017 CLINICAL DATA:  Patient has had cough and SOB x 4 days EXAM: CHEST  2 VIEW COMPARISON:  05/26/2014 FINDINGS: Status post median sternotomy. Heart size is normal. There are no pleural effusions. There is increase, mild interstitial prominence throughout the lungs, not associated with focal consolidation. Mild degenerative changes are seen in the midthoracic spine. IMPRESSION: Increased prominent interstitial markings. Findings may be related to viral or reactive airways disease. Less likely the findings could be related to edema. Electronically Signed   By: Nolon Nations M.D.   On: 09/02/2017 16:45   Pertinent labs & imaging results that were available during my care of the patient were reviewed by me and considered in my medical decision making (see chart for details).  Medications Ordered in ED Medications  albuterol (PROVENTIL,VENTOLIN) solution continuous neb (0  mg/hr Nebulization Stopped 09/02/17 2240)  furosemide (LASIX) injection 40 mg (40 mg Intravenous Given 09/02/17 1947)  ipratropium (ATROVENT) nebulizer solution 0.5 mg (0.5 mg Nebulization Given 09/02/17 2015)  predniSONE (DELTASONE) tablet 20 mg (20 mg Oral Given 09/02/17 1951)  albuterol (PROVENTIL HFA;VENTOLIN HFA) 108 (90 Base) MCG/ACT inhaler 2 puff (2 puffs Inhalation Given 09/02/17 2240)  Procedures Procedures  (including critical care time)  Medical Decision Making / ED Course I have reviewed the nursing notes for this encounter and the patient's prior records (if available in EHR or on provided paperwork).    Several days of dry cough with shortness of breath and worsening peripheral edema.  Chest x-ray without evidence of pulmonary edema however did show bronchitic changes.  Given the recent contact with URI symptoms and wheezing, there is likely bronchitis.  Patient was given albuterol nebulizer resulting in complete resolution of his wheezing.  Given the evidence of volume overload, the patient was given IV Lasix and he was able to put out large amount of urine.  Following treatment, patient was ambulated and had significant improvement and hence shortness of breath.  Feels well enough to be discharged home.  Recommended close follow-up with primary care provider.   The patient is safe for discharge with strict return precautions.   Final Clinical Impression(s) / ED Diagnoses Final diagnoses:  Shortness of breath  Bronchitis  Peripheral edema    Disposition: Discharge  Condition: Good  I have discussed the results, Dx and Tx plan with the patient who expressed understanding and agree(s) with the plan. Discharge instructions discussed at great length. The patient was given strict return precautions who verbalized understanding of the instructions. No  further questions at time of discharge.    ED Discharge Orders        Ordered    predniSONE (DELTASONE) 10 MG tablet  Daily     09/02/17 2233       Follow Up: [name], MD [address] [place] [phone]  Schedule an appointment as soon as possible for a visit  in 3-5 days, For close follow up to assess for peripheral edema     This chart was dictated using voice recognition software.  Despite best efforts to proofread,  errors can occur which can change the documentation meaning.   Fatima Blank, MD 09/02/17 2252

## 2017-09-02 NOTE — ED Triage Notes (Signed)
Pt reports sob and moderate leg swelling for several days. Has cough and abd swelling also. Reports occ chest pains. No acute resp distress is noted at triage and ekg done. Hx of chf and reports taking all his meds as prescribed.

## 2017-09-21 ENCOUNTER — Ambulatory Visit (INDEPENDENT_AMBULATORY_CARE_PROVIDER_SITE_OTHER): Payer: PPO | Admitting: Internal Medicine

## 2017-09-21 ENCOUNTER — Encounter: Payer: Self-pay | Admitting: Internal Medicine

## 2017-09-21 DIAGNOSIS — R600 Localized edema: Secondary | ICD-10-CM

## 2017-09-21 DIAGNOSIS — I110 Hypertensive heart disease with heart failure: Secondary | ICD-10-CM

## 2017-09-21 DIAGNOSIS — R188 Other ascites: Secondary | ICD-10-CM

## 2017-09-21 DIAGNOSIS — E118 Type 2 diabetes mellitus with unspecified complications: Secondary | ICD-10-CM

## 2017-09-21 DIAGNOSIS — R0602 Shortness of breath: Secondary | ICD-10-CM | POA: Diagnosis not present

## 2017-09-21 DIAGNOSIS — K7460 Unspecified cirrhosis of liver: Secondary | ICD-10-CM | POA: Diagnosis not present

## 2017-09-21 MED ORDER — DOXYCYCLINE HYCLATE 100 MG PO TABS: 100.0000 mg | ORAL_TABLET | Freq: Two times a day (BID) | ORAL | 0 refills | Status: DC

## 2017-09-21 MED ORDER — POTASSIUM CHLORIDE CRYS ER 20 MEQ PO TBCR: 20.0000 meq | EXTENDED_RELEASE_TABLET | Freq: Every day | ORAL | 0 refills | Status: DC

## 2017-09-21 MED ORDER — IPRATROPIUM-ALBUTEROL 0.5-2.5 (3) MG/3ML IN SOLN
3.0000 mL | Freq: Once | RESPIRATORY_TRACT | Status: AC
  Administered 2017-09-21: 3 mL via RESPIRATORY_TRACT

## 2017-09-21 MED ORDER — PREDNISONE 20 MG PO TABS: 40.0000 mg | ORAL_TABLET | Freq: Every day | ORAL | 0 refills | Status: DC

## 2017-09-21 MED ORDER — GABAPENTIN 100 MG PO CAPS: 100.0000 mg | ORAL_CAPSULE | Freq: Two times a day (BID) | ORAL | 0 refills | Status: DC

## 2017-09-21 MED ORDER — UMECLIDINIUM-VILANTEROL 62.5-25 MCG/INH IN AEPB: 1.0000 | INHALATION_SPRAY | Freq: Every day | RESPIRATORY_TRACT | 2 refills | Status: DC

## 2017-09-21 NOTE — Patient Instructions (Addendum)
We will have you double the lasix for 1 week to 1 pill in the morning and 1 pill in the afternoon to help get rid of some more fluid.  We have sent in prednisone to take 2 pills daily for 1 week as well as doxycycline to take 1 pill twice a day for 1 week.   We have also sent in an inhaler to use daily for the breathing. Use 1 puff daily of the anoro everyday to help you breath easier.   Come back in 1 month for a follow up. Call us sooner if the breathing is not getting better or gets worse.

## 2017-09-21 NOTE — Progress Notes (Signed)
   Subjective:    Patient ID: [name], male    DOB: Apr 23, 1946, 72 y.o.   MRN: [id number]  HPI The patient is a 72 YO man coming in for hospital follow up (in for increased fluid and diuresis causing SOB, prior hx cad and cirrhosis, lasix adjusted on discharge, counseled about low sodium diet). He is still having a lot of problems with SOB and breathing. He is coughing a lot still and having some rib pain from coughing. This gets worse with coughing. No pain with breathing. He is getting SOB with even small exertion. He has some increase in fluid in his stomach and legs but that is continuing to improve since leaving the hospital. He is down about 5 pounds since that time. Denies chest pains. Denies diarrhea or constipation. Denies headaches. Is trying to work on low sodium diet. SOB going on since lipoma resection and started even before fluid increased.   PMH, [facility], social history reviewed and updated.  Review of Systems  Constitutional: Positive for activity change and appetite change. Negative for chills, fatigue, fever and unexpected weight change.  HENT: Positive for congestion and postnasal drip. Negative for ear discharge, ear pain, rhinorrhea, sinus pressure, sinus pain, sneezing, sore throat, tinnitus, trouble swallowing and voice change.   Eyes: Negative.   Respiratory: Positive for cough and shortness of breath. Negative for chest tightness and wheezing.   Cardiovascular: Negative.   Gastrointestinal: Positive for abdominal distention. Negative for abdominal pain, constipation, diarrhea and nausea.  Musculoskeletal: Positive for arthralgias.  Neurological: Negative.       Objective:   Physical Exam  Constitutional: He is oriented to person, place, and time. He appears well-developed and well-nourished.  HENT:  Head: Normocephalic and atraumatic.  Oropharynx with redness  TMs normal bilaterally  Eyes: EOM are normal.  Neck: Normal range of motion. No thyromegaly present.    Cardiovascular: Normal rate and regular rhythm.  Pulmonary/Chest: Effort normal. No respiratory distress. He has wheezes. He has no rales.  Some rhonchi which partially clear with coughing, after duoneb there is still some residual wheezing but much improved  Abdominal: Soft. Bowel sounds are normal. He exhibits distension. He exhibits no mass. There is no tenderness. There is no rebound and no guarding.  Musculoskeletal: He exhibits tenderness.  Lymphadenopathy:    He has no cervical adenopathy.  Neurological: He is alert and oriented to person, place, and time. Coordination normal.  Skin: Skin is warm and dry.  Psychiatric: He has a normal mood and affect.   Vitals:   09/21/17 0925  BP: 122/62  Pulse: 81  Temp: 97.9 F (36.6 C)  TempSrc: Oral  SpO2: 99%  Weight: 272 lb (123.4 kg)  Height: 5\' 7"  (1.702 m)      Assessment & Plan:  duoneb given at visit

## 2017-09-22 NOTE — Assessment & Plan Note (Signed)
Suspect some component of COPD, with current exacerbation. Rx for prednisone, doxycycline, and anoro. Needs close follow up as it has been >1 year since last follow up.

## 2017-09-22 NOTE — Assessment & Plan Note (Signed)
Recent HgA1c 6.5 on his metformin. Complicated by neuropathy. Not adequately discussed at this visit and needs close follow up. Refilled gabapentin today.

## 2017-09-22 NOTE — Assessment & Plan Note (Signed)
Does have some ascites on exam today and taking lasix with good diuresis. Will likely need addition of spironolactone if not able to diurese with lasix alone.

## 2017-09-29 ENCOUNTER — Emergency Department (HOSPITAL_COMMUNITY): Payer: PPO

## 2017-09-29 ENCOUNTER — Other Ambulatory Visit: Payer: Self-pay

## 2017-09-29 ENCOUNTER — Encounter (HOSPITAL_COMMUNITY): Payer: Self-pay | Admitting: *Deleted

## 2017-09-29 ENCOUNTER — Inpatient Hospital Stay (HOSPITAL_COMMUNITY)
Admission: EM | Admit: 2017-09-29 | Discharge: 2017-10-05 | DRG: 378 | Disposition: A | Discharge status: Home Health | Payer: PPO | Attending: Family Medicine | Admitting: Family Medicine

## 2017-09-29 DIAGNOSIS — D649 Anemia, unspecified: Secondary | ICD-10-CM | POA: Diagnosis not present

## 2017-09-29 DIAGNOSIS — K921 Melena: Secondary | ICD-10-CM

## 2017-09-29 DIAGNOSIS — Z87442 Personal history of urinary calculi: Secondary | ICD-10-CM

## 2017-09-29 DIAGNOSIS — Z9049 Acquired absence of other specified parts of digestive tract: Secondary | ICD-10-CM

## 2017-09-29 DIAGNOSIS — M109 Gout, unspecified: Secondary | ICD-10-CM | POA: Diagnosis present

## 2017-09-29 DIAGNOSIS — K259 Gastric ulcer, unspecified as acute or chronic, without hemorrhage or perforation: Secondary | ICD-10-CM | POA: Diagnosis not present

## 2017-09-29 DIAGNOSIS — K264 Chronic or unspecified duodenal ulcer with hemorrhage: Secondary | ICD-10-CM | POA: Diagnosis not present

## 2017-09-29 DIAGNOSIS — I2510 Atherosclerotic heart disease of native coronary artery without angina pectoris: Secondary | ICD-10-CM | POA: Diagnosis present

## 2017-09-29 DIAGNOSIS — E785 Hyperlipidemia, unspecified: Secondary | ICD-10-CM | POA: Diagnosis present

## 2017-09-29 DIAGNOSIS — R0602 Shortness of breath: Secondary | ICD-10-CM

## 2017-09-29 DIAGNOSIS — G4730 Sleep apnea, unspecified: Secondary | ICD-10-CM | POA: Diagnosis present

## 2017-09-29 DIAGNOSIS — G4733 Obstructive sleep apnea (adult) (pediatric): Secondary | ICD-10-CM | POA: Diagnosis present

## 2017-09-29 DIAGNOSIS — D62 Acute posthemorrhagic anemia: Secondary | ICD-10-CM | POA: Diagnosis not present

## 2017-09-29 DIAGNOSIS — M479 Spondylosis, unspecified: Secondary | ICD-10-CM | POA: Diagnosis not present

## 2017-09-29 DIAGNOSIS — G8929 Other chronic pain: Secondary | ICD-10-CM | POA: Diagnosis not present

## 2017-09-29 DIAGNOSIS — Z6841 Body Mass Index (BMI) 40.0 and over, adult: Secondary | ICD-10-CM | POA: Diagnosis not present

## 2017-09-29 DIAGNOSIS — Z7982 Long term (current) use of aspirin: Secondary | ICD-10-CM | POA: Diagnosis not present

## 2017-09-29 DIAGNOSIS — E118 Type 2 diabetes mellitus with unspecified complications: Secondary | ICD-10-CM

## 2017-09-29 DIAGNOSIS — R600 Localized edema: Secondary | ICD-10-CM | POA: Diagnosis not present

## 2017-09-29 DIAGNOSIS — K319 Disease of stomach and duodenum, unspecified: Secondary | ICD-10-CM | POA: Diagnosis present

## 2017-09-29 DIAGNOSIS — J449 Chronic obstructive pulmonary disease, unspecified: Secondary | ICD-10-CM | POA: Diagnosis not present

## 2017-09-29 DIAGNOSIS — D500 Iron deficiency anemia secondary to blood loss (chronic): Secondary | ICD-10-CM | POA: Diagnosis not present

## 2017-09-29 DIAGNOSIS — N179 Acute kidney failure, unspecified: Secondary | ICD-10-CM | POA: Diagnosis present

## 2017-09-29 DIAGNOSIS — Z951 Presence of aortocoronary bypass graft: Secondary | ICD-10-CM

## 2017-09-29 DIAGNOSIS — I5032 Chronic diastolic (congestive) heart failure: Secondary | ICD-10-CM | POA: Diagnosis not present

## 2017-09-29 DIAGNOSIS — I351 Nonrheumatic aortic (valve) insufficiency: Secondary | ICD-10-CM | POA: Diagnosis not present

## 2017-09-29 DIAGNOSIS — R079 Chest pain, unspecified: Secondary | ICD-10-CM | POA: Diagnosis not present

## 2017-09-29 DIAGNOSIS — R188 Other ascites: Secondary | ICD-10-CM | POA: Diagnosis not present

## 2017-09-29 DIAGNOSIS — I110 Hypertensive heart disease with heart failure: Secondary | ICD-10-CM | POA: Diagnosis not present

## 2017-09-29 DIAGNOSIS — Z801 Family history of malignant neoplasm of trachea, bronchus and lung: Secondary | ICD-10-CM

## 2017-09-29 DIAGNOSIS — K922 Gastrointestinal hemorrhage, unspecified: Secondary | ICD-10-CM | POA: Diagnosis not present

## 2017-09-29 DIAGNOSIS — Z955 Presence of coronary angioplasty implant and graft: Secondary | ICD-10-CM | POA: Diagnosis not present

## 2017-09-29 DIAGNOSIS — Z7984 Long term (current) use of oral hypoglycemic drugs: Secondary | ICD-10-CM

## 2017-09-29 DIAGNOSIS — K269 Duodenal ulcer, unspecified as acute or chronic, without hemorrhage or perforation: Secondary | ICD-10-CM | POA: Diagnosis not present

## 2017-09-29 DIAGNOSIS — M545 Low back pain: Secondary | ICD-10-CM | POA: Diagnosis not present

## 2017-09-29 DIAGNOSIS — K219 Gastro-esophageal reflux disease without esophagitis: Secondary | ICD-10-CM | POA: Diagnosis present

## 2017-09-29 DIAGNOSIS — K7460 Unspecified cirrhosis of liver: Secondary | ICD-10-CM

## 2017-09-29 DIAGNOSIS — Z87891 Personal history of nicotine dependence: Secondary | ICD-10-CM

## 2017-09-29 DIAGNOSIS — R0600 Dyspnea, unspecified: Secondary | ICD-10-CM | POA: Diagnosis not present

## 2017-09-29 DIAGNOSIS — I25810 Atherosclerosis of coronary artery bypass graft(s) without angina pectoris: Secondary | ICD-10-CM | POA: Diagnosis not present

## 2017-09-29 LAB — HEPATIC FUNCTION PANEL
ALT: 21 U/L (ref 17–63)
AST: 25 U/L (ref 15–41)
Albumin: 3.3 g/dL — ABNORMAL LOW (ref 3.5–5.0)
Alkaline Phosphatase: 92 U/L (ref 38–126)
Bilirubin, Direct: 0.1 mg/dL (ref 0.1–0.5)
Indirect Bilirubin: 0.7 mg/dL (ref 0.3–0.9)
Total Bilirubin: 0.8 mg/dL (ref 0.3–1.2)
Total Protein: 6.5 g/dL (ref 6.5–8.1)

## 2017-09-29 LAB — PREPARE RBC (CROSSMATCH)

## 2017-09-29 LAB — CBC
HCT: 18.9 % — ABNORMAL LOW (ref 39.0–52.0)
Hemoglobin: 5.6 g/dL — CL (ref 13.0–17.0)
MCH: 25.2 pg — ABNORMAL LOW (ref 26.0–34.0)
MCHC: 29.6 g/dL — ABNORMAL LOW (ref 30.0–36.0)
MCV: 85.1 fL (ref 78.0–100.0)
Platelets: 147 10*3/uL — ABNORMAL LOW (ref 150–400)
RBC: 2.22 MIL/uL — ABNORMAL LOW (ref 4.22–5.81)
RDW: 17.2 % — ABNORMAL HIGH (ref 11.5–15.5)
WBC: 5.3 10*3/uL (ref 4.0–10.5)

## 2017-09-29 LAB — BASIC METABOLIC PANEL WITH GFR
Anion gap: 12 (ref 5–15)
BUN: 30 mg/dL — ABNORMAL HIGH (ref 6–20)
CO2: 23 mmol/L (ref 22–32)
Calcium: 8.4 mg/dL — ABNORMAL LOW (ref 8.9–10.3)
Chloride: 102 mmol/L (ref 101–111)
Creatinine, Ser: 1.33 mg/dL — ABNORMAL HIGH (ref 0.61–1.24)
GFR calc Af Amer: >60 mL/min
GFR calc non Af Amer: 52 mL/min — ABNORMAL LOW
Glucose, Bld: 170 mg/dL — ABNORMAL HIGH (ref 65–99)
Potassium: 4.3 mmol/L (ref 3.5–5.1)
Sodium: 137 mmol/L (ref 135–145)

## 2017-09-29 LAB — I-STAT TROPONIN, ED: Troponin i, poc: 0.01 ng/mL (ref 0.00–0.08)

## 2017-09-29 LAB — POC OCCULT BLOOD, ED: Fecal Occult Bld: POSITIVE — AB

## 2017-09-29 MED ORDER — DEXTROSE 5 % IV SOLN
1.0000 g | Freq: Once | INTRAVENOUS | Status: AC
  Administered 2017-09-29: 1 g via INTRAVENOUS
  Filled 2017-09-29: qty 10

## 2017-09-29 MED ORDER — ONDANSETRON HCL 4 MG/2ML IJ SOLN: 4.0000 mg | Freq: Four times a day (QID) | INTRAMUSCULAR | Status: DC | PRN

## 2017-09-29 MED ORDER — PANTOPRAZOLE SODIUM 40 MG IV SOLR
40.0000 mg | Freq: Two times a day (BID) | INTRAVENOUS | Status: DC
  Administered 2017-10-03 – 2017-10-05 (×5): 40 mg via INTRAVENOUS
  Filled 2017-09-29 (×5): qty 40

## 2017-09-29 MED ORDER — ONDANSETRON HCL 4 MG PO TABS: 4.0000 mg | ORAL_TABLET | Freq: Four times a day (QID) | ORAL | Status: DC | PRN

## 2017-09-29 MED ORDER — PANTOPRAZOLE SODIUM 40 MG IV SOLR: 40.0000 mg | Freq: Once | INTRAVENOUS | Status: DC

## 2017-09-29 MED ORDER — IPRATROPIUM-ALBUTEROL 0.5-2.5 (3) MG/3ML IN SOLN: 3.0000 mL | RESPIRATORY_TRACT | Status: DC | PRN

## 2017-09-29 MED ORDER — SODIUM CHLORIDE 0.9 % IV SOLN
8.0000 mg/h | INTRAVENOUS | Status: AC
  Administered 2017-09-29 – 2017-10-02 (×6): 8 mg/h via INTRAVENOUS
  Filled 2017-09-29 (×10): qty 80

## 2017-09-29 MED ORDER — SODIUM CHLORIDE 0.9 % IV SOLN
10.0000 mL/h | Freq: Once | INTRAVENOUS | Status: AC
  Administered 2017-09-29: 10 mL/h via INTRAVENOUS

## 2017-09-29 MED ORDER — INSULIN ASPART 100 UNIT/ML ~~LOC~~ SOLN
0.0000 [IU] | Freq: Three times a day (TID) | SUBCUTANEOUS | Status: DC
  Administered 2017-09-30: 2 [IU] via SUBCUTANEOUS
  Administered 2017-09-30: 1 [IU] via SUBCUTANEOUS
  Administered 2017-10-01: 2 [IU] via SUBCUTANEOUS
  Administered 2017-10-01 – 2017-10-05 (×8): 1 [IU] via SUBCUTANEOUS

## 2017-09-29 MED ORDER — ACETAMINOPHEN 650 MG RE SUPP: 650.0000 mg | Freq: Four times a day (QID) | RECTAL | Status: DC | PRN

## 2017-09-29 MED ORDER — ACETAMINOPHEN 325 MG PO TABS: 650.0000 mg | ORAL_TABLET | Freq: Four times a day (QID) | ORAL | Status: DC | PRN

## 2017-09-29 MED ORDER — FUROSEMIDE 10 MG/ML IJ SOLN
20.0000 mg | Freq: Once | INTRAMUSCULAR | Status: AC
  Administered 2017-09-29: 20 mg via INTRAVENOUS
  Filled 2017-09-29: qty 2

## 2017-09-29 MED ORDER — UMECLIDINIUM-VILANTEROL 62.5-25 MCG/INH IN AEPB
1.0000 | INHALATION_SPRAY | Freq: Every day | RESPIRATORY_TRACT | Status: DC
  Administered 2017-09-30 – 2017-10-05 (×6): 1 via RESPIRATORY_TRACT
  Filled 2017-09-29: qty 14

## 2017-09-29 MED ORDER — SODIUM CHLORIDE 0.9 % IV SOLN
80.0000 mg | Freq: Once | INTRAVENOUS | Status: AC
  Administered 2017-09-29: 80 mg via INTRAVENOUS
  Filled 2017-09-29: qty 80

## 2017-09-29 NOTE — ED Triage Notes (Signed)
Pt reports mid epigastric pain for extended amount of time. Recently treated for bronchitis but reports no relief with meds given. Has increase in sob, moderate leg swelling and now has weakness and tingling to bilateral hands and legs.

## 2017-09-29 NOTE — ED Notes (Signed)
Date and time results received: 09/29/17 (use smartphrase ".now" to insert current time)  Test: HgB Critical Value: 5.6  Name of Provider Notified: RN 1st  Orders Received? Or Actions Taken?: patient placed in room

## 2017-09-29 NOTE — H&P (Signed)
History and Physical    [name] IRS:[id number] DOB: 08/21/1946 DOA: 09/29/2017  Referring MD/NP/PA: Dr. Lilian Coma (resident) PCP: [name], MD  Patient coming from: Home  Chief Complaint: Shortness of breath  I have personally briefly reviewed patient's old medical records in [place]   HPI: [name] is a 72 y.o. male with medical history significant of CAD s/p CABG, CHF, DM type II, hepatic cirrhosis, upper GI bleed followed Dr. Watt Climes, and OSA not on CPAP; who presents with complaints of intermittent shortness of breath and epigastric pain over the last 2-3 days.  History is obtained from the patient who reports being unable to read right at baseline and notes that his medications are managed by his son.  He was seen last week by his PCP and started on prednisone and possibly doxycycline for what he reports is bronchitis.  He reports improvement of his cough since that time, but reports a Rhymes-like epigastric pain that makes it feel like he cannot take catch his breath.  Associated symptoms include lightheadedness, generalized weakness, intermittent tingling, shortness of breath with exertion, leg swelling which she reports is chronic and unchanged.He reports previous history of GI bleed many years ago and states that his epigastric pain feels similar to when he was noted to have an ulcer.  Denies trying anything specifically to relieve symptoms.  Denies any chest pain, loss of consciousness, or NSAID use.  Last EGD appears to have been performed back in 01/2014 showing possible portal gastropathy and signs of Barrett's esophagus.  ED Course: Upon admission to the emergency department patient was noted to be afebrile, pulse 72-78, respirations 17-20, blood pressures maintained, and O2 saturations 100% on room air.  Labs revealed hemoglobin 5.6, platelets 147, BUN 38, creatinine 1.33, glucose 170, and troponin 0.01.  Patient was noted to have dark stools that were  guaiac positive on rectal exam.  Chest x-ray showed borderline heart size with bronchiectasis changes.  Patient was typed & screened, order to be transfused 2 units of blood, given 1 g of ceftriaxone IV,  and 40 mg of Protonix IV.  [facility] was consulted and will see the patient in a.m.  TRH called to admit.   Review of Systems  Constitutional: Positive for malaise/fatigue. Negative for chills and fever.  HENT: Negative for ear discharge and ear pain.   Eyes: Negative for double vision and photophobia.  Respiratory: Positive for cough and shortness of breath.   Cardiovascular: Negative for chest pain.  Gastrointestinal: Positive for abdominal pain.  Genitourinary: Negative for dysuria and frequency.  Musculoskeletal: Negative for joint pain and myalgias.  Neurological: Negative for focal weakness and seizures.  Psychiatric/Behavioral: Negative for suicidal ideas. The patient does not have insomnia.     Past Medical History:  Diagnosis Date  . Arthritis    "joints tighten up sometimes" (03/27/2104)  . Asthma   . CHF (congestive heart failure) ([place])   . Chronic low back pain   . Chronic lower back pain   . Coronary artery disease    a. 05/2012 Cath/PCI: LM 40ost, 50-60d, LAD 99p ruptured plaque (3.0x28 DES), LCX 50p/m, RCA 30-40p, 22m, EF 65-70%.  . Family history of adverse reaction to anesthesia    Pt granddaughter had PONV  . GERD (gastroesophageal reflux disease)   . Hepatic cirrhosis ([place])    a. Dx 01/2014 - CT a/p   . History of blood transfusion    "related to bleeding ulcers"  . History  of concussion    1976--  NO RESIDUAL  . History of GI bleed    a. UGIB 07/2012;  b. 01/2014 admission with GIB/FOB stool req 1U prbc's->EGD showed portal gastropathy, barrett's esoph, and chronic active h. pylori gastritis.  Marland Kitchen History of gout    2007 &  2008  LEFT LEG-- NO ISSUE SINCE  . Hyperlipidemia   . Iron deficiency anemia   . Kidney stones   . Lipoma   . OA (osteoarthritis of  spine)    LOWER BACK--  INTERMITTANT LEFT LEG NUMBNESS  . OSA (obstructive sleep apnea)    PULMOLOGIST-  DR CLANCE--  MODERATE OSA  STARTED CPAP 2012--  BUT CURRENTLY HAS NOT USED PAST 6 MONTHS  . Phimosis    a. s/p circumcision 2015.  . Type 2 diabetes mellitus ([place])   . Unspecified essential hypertension     Past Surgical History:  Procedure Laterality Date  . ANKLE FRACTURE SURGERY Right 1989   "plate put in"  . APPENDECTOMY  05-16-2004   open  . CIRCUMCISION N/A 09/09/2013   Procedure: CIRCUMCISION ADULT;  Surgeon: Bernestine Amass, MD;  Location[facility];  Service: Urology;  Laterality: N/A;  . COLECTOMY  05-16-2004  . CORONARY ANGIOPLASTY WITH STENT PLACEMENT  06/28/2012  DR COOPER   PCI W/  X1 DES to [place]. LAD/  LM  40% OSTIAL & 50-60% DISTAL /  50% PROX LCX/  30-40% PROX RCA & 50% MID RCA/   LVEF 65-70%  . CORONARY ARTERY BYPASS GRAFT N/A 03/31/2014   Procedure: CORONARY ARTERY BYPASS GRAFTING (CABG) times 3 using left internal mammary artery and right saphenous vein.;  Surgeon: Melrose Nakayama, MD;  Location: [place];  Service: Open Heart Surgery;  Laterality: N/A;  . DOBUTAMINE STRESS ECHO  06-08-2012   MODERATE HYPOKINESIS/ ISCHEMIA MID INFERIOR WALL  . ESOPHAGOGASTRODUODENOSCOPY  08/15/2012   Procedure: ESOPHAGOGASTRODUODENOSCOPY (EGD);  Surgeon: Wonda Horner, MD;  Location: [facility] ENDOSCOPY;  Service: Endoscopy;  Laterality: N/A;  . ESOPHAGOGASTRODUODENOSCOPY N/A 02/17/2014   Procedure: ESOPHAGOGASTRODUODENOSCOPY (EGD);  Surgeon: Jeryl Columbia, MD;  Location: [facility] ENDOSCOPY;  Service: Endoscopy;  Laterality: N/A;  . HERNIA REPAIR    . INTRAOPERATIVE TRANSESOPHAGEAL ECHOCARDIOGRAM N/A 03/31/2014   Procedure: INTRAOPERATIVE TRANSESOPHAGEAL ECHOCARDIOGRAM;  Surgeon: Melrose Nakayama, MD;  Location: [place];  Service: Open Heart Surgery;  Laterality: N/A;  . LAPAROSCOPIC UMBILICAL HERNIA REPAIR W/ MESH  06-06-2011  . LEFT HEART CATHETERIZATION WITH CORONARY  ANGIOGRAM N/A 03/28/2014   Procedure: LEFT HEART CATHETERIZATION WITH CORONARY ANGIOGRAM;  Surgeon: Sinclair Grooms, MD;  Location: [facility] CATH LAB;  Service: Cardiovascular;  Laterality: N/A;  . LIPOMA EXCISION Left 08/25/2017   Procedure: EXCISION LIPOMA LEFT POSTERIOR THIGH;  Surgeon: Clovis Riley, MD;  Location: [place];  Service: General;  Laterality: Left;  . NEPHROLITHOTOMY  1990'S  . OPEN APPENDECTOMY W/ PARTIAL CECECTOMY  05-16-2004  . PERCUTANEOUS CORONARY STENT INTERVENTION (PCI-S) N/A 06/28/2012   Procedure: PERCUTANEOUS CORONARY STENT INTERVENTION (PCI-S);  Surgeon: Sherren Mocha, MD;  Location: [facility] CATH LAB;  Service: Cardiovascular;  Laterality: N/A;     reports that he quit smoking about 21 years ago. His smoking use included cigarettes. He has a 80.00 pack-year smoking history. he has never used smokeless tobacco. He reports that he drinks alcohol. He reports that he does not use drugs.  No Known Allergies  Family History  Problem Relation Age of Onset  . Lung cancer Sister   . Cancer  Sister        lung  . Cancer Mother   . Cancer Father        died in his 36s.  . Cancer Brother        lung  . Coronary artery disease Unknown   . Diabetes Unknown   . Colon cancer Unknown   . Cancer Sister        lung    Prior to Admission medications   Medication Sig Start Date End Date Taking? Authorizing Provider  aspirin EC 81 MG tablet Take 1 tablet (81 mg total) by mouth 2 (two) times daily. 06/09/15   Nahser, Wonda Cheng, MD  atorvastatin (LIPITOR) 40 MG tablet TAKE 1 TABLET BY MOUTH DAILY AT 6 PM Patient taking differently: Take 40 mg by mouth daily at 6 PM. TAKE 1 TABLET BY MOUTH DAILY AT 6 PM 07/04/17   [name], MD  doxycycline (VIBRA-TABS) 100 MG tablet Take 1 tablet (100 mg total) by mouth 2 (two) times daily. 09/21/17   [name], MD  furosemide (LASIX) 40 MG tablet TAKE 1 TABLET BY MOUTH TWICE DAILY Patient taking differently: TAKE 40 mg  TABLET  BY MOUTH TWICE DAILY 07/04/17   [name], MD  gabapentin (NEURONTIN) 100 MG capsule Take 1 capsule (100 mg total) by mouth 2 (two) times daily. 09/21/17   [name], MD  losartan (COZAAR) 50 MG tablet TAKE 1 TABLET BY MOUTH EVERY DAY Patient taking differently: TAKE 1 TABLET (50mg ) BY MOUTH EVERY DAY 07/04/17   [name], MD  metFORMIN (GLUCOPHAGE) 500 MG tablet TAKE 1 TABLET BY MOUTH TWICE DAILY WITH A MEAL Patient taking differently: TAKE 500 mg TABLET BY MOUTH TWICE DAILY WITH A MEAL 07/04/17   [name], MD  nitroGLYCERIN (NITROSTAT) 0.4 MG SL tablet Place 1 tablet (0.4 mg total) under the tongue every 5 (five) minutes as needed for chest pain. 06/08/16 08/25/17  Nahser, Wonda Cheng, MD  potassium chloride SA (K-DUR,KLOR-CON) 20 MEQ tablet Take 1 tablet (20 mEq total) by mouth daily. 09/21/17   [name], MD  predniSONE (DELTASONE) 20 MG tablet Take 2 tablets (40 mg total) by mouth daily with breakfast. 09/21/17   [name], MD  umeclidinium-vilanterol (ANORO ELLIPTA) 62.5-25 MCG/INH AEPB Inhale 1 puff into the lungs daily. 09/21/17   [name], MD    Physical Exam:  Constitutional: Obese male in no acute distress at this time Vitals:   09/29/17 1814 09/29/17 1941 09/29/17 2015 09/29/17 2030  BP: (!) 147/59 (!) 137/42 (!) 145/48 (!) 140/49  Pulse: 72 74 78 73  Resp: 18 20 17 20   Temp: 98.2 F (36.8 C) 98.3 F (36.8 C)    TempSrc:  Oral    SpO2: 100% 100% 100% 100%   Eyes: PERRL, lids and conjunctivae normal ENMT: Mucous membranes are moist. Posterior pharynx clear of any exudate or lesions.   Neck: normal, supple, no masses, no thyromegaly Respiratory: clear to auscultation bilaterally, no wheezing, no crackles. Normal respiratory effort. No accessory muscle use.  Cardiovascular: Regular rate and rhythm, no murmurs / rubs / gallops.  3+ pitting lower extremity edema present. 2+ pedal pulses. No carotid  bruits.  Abdomen: no tenderness, no masses palpated. No hepatosplenomegaly. Bowel sounds positive.  Musculoskeletal: no clubbing / cyanosis. No joint deformity upper and lower extremities. Good ROM, no contractures. Normal muscle tone.  Skin: no rashes, lesions, ulcers. No induration Neurologic: CN 2-12 grossly intact. Sensation  intact, DTR normal. Strength 5/5 in all 4.  Psychiatric: Normal judgment and insight. Alert and oriented x 3. Normal mood.     Labs on Admission: I have personally reviewed following labs and imaging studies  CBC: Recent Labs  Lab 09/29/17 1819  WBC 5.3  HGB 5.6*  HCT 18.9*  MCV 85.1  PLT 829*   Basic Metabolic Panel: Recent Labs  Lab 09/29/17 1819  NA 137  K 4.3  CL 102  CO2 23  GLUCOSE 170*  BUN 30*  CREATININE 1.33*  CALCIUM 8.4*   GFR: Estimated Creatinine Clearance: 64.1 mL/min (A) (by C-G formula based on SCr of 1.33 mg/dL (H)). Liver Function Tests: No results for input(s): AST, ALT, ALKPHOS, BILITOT, PROT, ALBUMIN in the last 168 hours. No results for input(s): LIPASE, AMYLASE in the last 168 hours. No results for input(s): AMMONIA in the last 168 hours. Coagulation Profile: No results for input(s): INR, PROTIME in the last 168 hours. Cardiac Enzymes: No results for input(s): CKTOTAL, CKMB, CKMBINDEX, TROPONINI in the last 168 hours. BNP (last 3 results) No results for input(s): PROBNP in the last 8760 hours. HbA1C: No results for input(s): HGBA1C in the last 72 hours. CBG: No results for input(s): GLUCAP in the last 168 hours. Lipid Profile: No results for input(s): CHOL, HDL, LDLCALC, TRIG, CHOLHDL, LDLDIRECT in the last 72 hours. Thyroid Function Tests: No results for input(s): TSH, T4TOTAL, FREET4, T3FREE, THYROIDAB in the last 72 hours. Anemia Panel: No results for input(s): VITAMINB12, FOLATE, FERRITIN, TIBC, IRON, RETICCTPCT in the last 72 hours. Urine analysis:    Component Value Date/Time   COLORURINE YELLOW  03/29/2014 1546   APPEARANCEUR CLEAR 03/29/2014 1546   LABSPEC 1.011 03/29/2014 1546   PHURINE 6.0 03/29/2014 1546   GLUCOSEU NEGATIVE 03/29/2014 1546   HGBUR NEGATIVE 03/29/2014 1546   BILIRUBINUR NEGATIVE 03/29/2014 1546   KETONESUR NEGATIVE 03/29/2014 1546   PROTEINUR NEGATIVE 03/29/2014 1546   UROBILINOGEN 0.2 03/29/2014 1546   NITRITE NEGATIVE 03/29/2014 1546   LEUKOCYTESUR NEGATIVE 03/29/2014 1546   Sepsis Labs: No results found for this or any previous visit (from the past 240 hour(s)).   Radiological Exams on Admission: Dg Chest 2 View  Result Date: 09/29/2017 CLINICAL DATA:  Chest pain, shortness of Breath EXAM: CHEST  2 VIEW COMPARISON:  09/02/2017 FINDINGS: Prior CABG. Heart is mildly enlarged. Peribronchial thickening and interstitial prominence, likely bronchitic changes. IMPRESSION: Borderline heart size.  Bronchitic changes. Electronically Signed   By: Rolm Baptise M.D.   On: 09/29/2017 18:49    EKG: Independently reviewed.  Normal sinus rhythm with low voltage no significant change from previous  Assessment/Plan Acute blood loss anemia, suspected upper GI bleed: Acute.  Patient presents with a hemoglobin of 5.8 previously 9.2 on 1/5.  Patient found to be guaiac positive with elevated BUN to suggest possible upper GI bleed.  [facility] consulted.  Patient with previous history of upper GI bleeding in the past . - Admit to a telemetry bed - Npo - Protonix drip per protocol - Continue transfuse total of 3 units of packed red blood cells - Appreciate equal GI consultative services, will follow-up for further recommendations  Epigastric abdominal pain: Acute.  Patient reports cutting off his breath and feels similar to previous likely related to an ulcer.   - As seen above  Acute kidney injury: Patient's baseline creatinine previously noted to be around 0.9-1.14.  Initial creatinine noted to be 1.33 with BUN 38 on admission to suggest prerenal cause  likely related with  acute blood loss. - Recheck BMP in a.m.  History of CHF: Patient appears to be fluid overloaded with 3+ pitting edema noted on physical exam.  Lungs sound clear.  Chest x-ray otherwise notes borderline heart size. - Strict intake and output - Daily weights - add on BNP - Give 20 mg of Lasix IV after second bag of PRBCs started  Hepatic cirrhosis  Diabetes mellitus type 2: Well controlled last hemoglobin A1c noted to be 6.5 on 08/25/2017. - Hypoglycemic protocols - Hold metformin - CBGs q. before meals with sensitive SSI  CAD s/p CABG - Hold aspirin 2/2 gi bleed   Essential hypertension: Blood pressures are noted to be intermittently soft. - Hold blood pressure medications  History of asthma/COPD - Continue Anoro Ellipta - DuoNeb's prn shortness of breath/wheezing  DVT prophylaxis: scds Code Status: Full Family Communication: No family present at bedside Disposition Plan: tbd Consults called: GI  Admission status: inpatient  Norval Morton MD [facility] Pager [phone]   If 7PM-7AM, please contact night-coverage [email or web address] Password [facility]  09/29/2017, 9:28 PM

## 2017-09-29 NOTE — ED Provider Notes (Signed)
[facility] EMERGENCY DEPARTMENT Provider Note   CSN: [id number] Arrival date & time: 09/29/17  1803     History   Chief Complaint Chief Complaint  Patient presents with  . Chest Pain  . Shortness of Breath    HPI [name] is a 72 y.o. male.   Chest Pain   This is a new problem. The current episode started 12 to 24 hours ago. The problem has not changed since onset.The pain is associated with exertion and walking. The pain is present in the epigastric region. The pain is moderate. Quality: aching. The pain does not radiate. Associated symptoms include abdominal pain (epigastric), lower extremity edema and shortness of breath. Pertinent negatives include no back pain, no cough, no diaphoresis, no fever, no nausea, no palpitations and no vomiting. He has tried nothing for the symptoms. Risk factors include obesity.  His past medical history is significant for CAD and diabetes.  Pertinent negatives for past medical history include no seizures. Past medical history comments: cirrhosis  Shortness of Breath  Associated symptoms include chest pain and abdominal pain (epigastric). Pertinent negatives include no fever, no sore throat, no ear pain, no cough, no vomiting and no rash. Associated medical issues include CAD.    Past Medical History:  Diagnosis Date  . Arthritis    "joints tighten up sometimes" (03/27/2104)  . Asthma   . CHF (congestive heart failure) ([place])   . Chronic low back pain   . Chronic lower back pain   . Coronary artery disease    a. 05/2012 Cath/PCI: LM 40ost, 50-60d, LAD 99p ruptured plaque (3.0x28 DES), LCX 50p/m, RCA 30-40p, 84m, EF 65-70%.  . Family history of adverse reaction to anesthesia    Pt granddaughter had PONV  . GERD (gastroesophageal reflux disease)   . Hepatic cirrhosis ([place])    a. Dx 01/2014 - CT a/p   . History of blood transfusion    "related to bleeding ulcers"  . History of concussion    1976--  NO RESIDUAL  .  History of GI bleed    a. UGIB 07/2012;  b. 01/2014 admission with GIB/FOB stool req 1U prbc's->EGD showed portal gastropathy, barrett's esoph, and chronic active h. pylori gastritis.  Marland Kitchen History of gout    2007 &  2008  LEFT LEG-- NO ISSUE SINCE  . Hyperlipidemia   . Iron deficiency anemia   . Kidney stones   . Lipoma   . OA (osteoarthritis of spine)    LOWER BACK--  INTERMITTANT LEFT LEG NUMBNESS  . OSA (obstructive sleep apnea)    PULMOLOGIST-  DR CLANCE--  MODERATE OSA  STARTED CPAP 2012--  BUT CURRENTLY HAS NOT USED PAST 6 MONTHS  . Phimosis    a. s/p circumcision 2015.  . Type 2 diabetes mellitus ([place])   . Unspecified essential hypertension     Patient Active Problem List   Diagnosis Date Noted  . SOB (shortness of breath) 09/21/2017  . Morbid obesity ([place]) 02/05/2016  . Numerous moles 09/15/2015  . S/P CABG x 3 03/31/2014  . History of GI bleed   . Unstable angina ([place]) 03/27/2014  . Hepatic cirrhosis ([place]) 02/27/2014  . Hyperlipidemia with target LDL less than 70 06/29/2012  . Coronary artery disease    . OSA (obstructive sleep apnea)   . Type 2 diabetes with complication ([employer]) 51/09/5850  . GERD 08/04/2010  . History of gout   . Hypertensive heart disease     Past Surgical  History:  Procedure Laterality Date  . ANKLE FRACTURE SURGERY Right 1989   "plate put in"  . APPENDECTOMY  05-16-2004   open  . CIRCUMCISION N/A 09/09/2013   Procedure: CIRCUMCISION ADULT;  Surgeon: Bernestine Amass, MD;  Location: [facility];  Service: Urology;  Laterality: N/A;  . COLECTOMY  05-16-2004  . CORONARY ANGIOPLASTY WITH STENT PLACEMENT  06/28/2012  DR COOPER   PCI W/  X1 DES to [place]. LAD/  LM  40% OSTIAL & 50-60% DISTAL /  50% PROX LCX/  30-40% PROX RCA & 50% MID RCA/   LVEF 65-70%  . CORONARY ARTERY BYPASS GRAFT N/A 03/31/2014   Procedure: CORONARY ARTERY BYPASS GRAFTING (CABG) times 3 using left internal mammary artery and right saphenous vein.;  Surgeon: Melrose Nakayama, MD;  Location: [facility];  Service: Open Heart Surgery;  Laterality: N/A;  . DOBUTAMINE STRESS ECHO  06-08-2012   MODERATE HYPOKINESIS/ ISCHEMIA MID INFERIOR WALL  . ESOPHAGOGASTRODUODENOSCOPY  08/15/2012   Procedure: ESOPHAGOGASTRODUODENOSCOPY (EGD);  Surgeon: Wonda Horner, MD;  Location[facility] ENDOSCOPY;  Service: Endoscopy;  Laterality: N/A;  . ESOPHAGOGASTRODUODENOSCOPY N/A 02/17/2014   Procedure: ESOPHAGOGASTRODUODENOSCOPY (EGD);  Surgeon: Jeryl Columbia, MD;  Location: [facility] ENDOSCOPY;  Service: Endoscopy;  Laterality: N/A;  . HERNIA REPAIR    . INTRAOPERATIVE TRANSESOPHAGEAL ECHOCARDIOGRAM N/A 03/31/2014   Procedure: INTRAOPERATIVE TRANSESOPHAGEAL ECHOCARDIOGRAM;  Surgeon: Melrose Nakayama, MD;  Location: [facility];  Service: Open Heart Surgery;  Laterality: N/A;  . LAPAROSCOPIC UMBILICAL HERNIA REPAIR W/ MESH  06-06-2011  . LEFT HEART CATHETERIZATION WITH CORONARY ANGIOGRAM N/A 03/28/2014   Procedure: LEFT HEART CATHETERIZATION WITH CORONARY ANGIOGRAM;  Surgeon: Sinclair Grooms, MD;  Location: [facility] CATH LAB;  Service: Cardiovascular;  Laterality: N/A;  . LIPOMA EXCISION Left 08/25/2017   Procedure: EXCISION LIPOMA LEFT POSTERIOR THIGH;  Surgeon: Clovis Riley, MD;  Location: [facility];  Service: General;  Laterality: Left;  . NEPHROLITHOTOMY  1990'S  . OPEN APPENDECTOMY W/ PARTIAL CECECTOMY  05-16-2004  . PERCUTANEOUS CORONARY STENT INTERVENTION (PCI-S) N/A 06/28/2012   Procedure: PERCUTANEOUS CORONARY STENT INTERVENTION (PCI-S);  Surgeon: Sherren Mocha, MD;  Location: [facility] CATH LAB;  Service: Cardiovascular;  Laterality: N/A;       Home Medications    Prior to Admission medications   Medication Sig Start Date End Date Taking? Authorizing Provider  aspirin EC 81 MG tablet Take 1 tablet (81 mg total) by mouth 2 (two) times daily. 06/09/15   Nahser, Wonda Cheng, MD  atorvastatin (LIPITOR) 40 MG tablet TAKE 1 TABLET BY MOUTH DAILY AT 6 PM Patient taking differently: Take 40 mg by mouth  daily at 6 PM. TAKE 1 TABLET BY MOUTH DAILY AT 6 PM 07/04/17   [name], MD  doxycycline (VIBRA-TABS) 100 MG tablet Take 1 tablet (100 mg total) by mouth 2 (two) times daily. 09/21/17   [name], MD  furosemide (LASIX) 40 MG tablet TAKE 1 TABLET BY MOUTH TWICE DAILY Patient taking differently: TAKE 40 mg  TABLET BY MOUTH TWICE DAILY 07/04/17   [name], MD  gabapentin (NEURONTIN) 100 MG capsule Take 1 capsule (100 mg total) by mouth 2 (two) times daily. 09/21/17   [name], MD  losartan (COZAAR) 50 MG tablet TAKE 1 TABLET BY MOUTH EVERY DAY Patient taking differently: TAKE 1 TABLET (50mg ) BY MOUTH EVERY DAY 07/04/17   [name], MD  metFORMIN (GLUCOPHAGE) 500 MG tablet TAKE 1 TABLET BY MOUTH TWICE DAILY  WITH A MEAL Patient taking differently: TAKE 500 mg TABLET BY MOUTH TWICE DAILY WITH A MEAL 07/04/17   [name], MD  nitroGLYCERIN (NITROSTAT) 0.4 MG SL tablet Place 1 tablet (0.4 mg total) under the tongue every 5 (five) minutes as needed for chest pain. 06/08/16 08/25/17  Nahser, Wonda Cheng, MD  potassium chloride SA (K-DUR,KLOR-CON) 20 MEQ tablet Take 1 tablet (20 mEq total) by mouth daily. 09/21/17   [name], MD  predniSONE (DELTASONE) 20 MG tablet Take 2 tablets (40 mg total) by mouth daily with breakfast. 09/21/17   [name], MD  umeclidinium-vilanterol (ANORO ELLIPTA) 62.5-25 MCG/INH AEPB Inhale 1 puff into the lungs daily. 09/21/17   [name], MD    Family History Family History  Problem Relation Age of Onset  . Lung cancer Sister   . Cancer Sister        lung  . Cancer Mother   . Cancer Father        died in his 52s.  . Cancer Brother        lung  . Coronary artery disease Unknown   . Diabetes Unknown   . Colon cancer Unknown   . Cancer Sister        lung    Social History Social History   Tobacco Use  . Smoking status: Former Smoker    Packs/day: 2.00    Years:  40.00    Pack years: 80.00    Types: Cigarettes    Last attempt to quit: 08/29/1996    Years since quitting: 21.0  . Smokeless tobacco: Never Used  Substance Use Topics  . Alcohol use: Yes    Alcohol/week: 0.0 oz    Comment: occasional  . Drug use: No     Allergies   Patient has no known allergies.   Review of Systems Review of Systems  Constitutional: Negative for chills, diaphoresis and fever.  HENT: Negative for ear pain and sore throat.   Eyes: Negative for pain and visual disturbance.  Respiratory: Positive for shortness of breath. Negative for cough.   Cardiovascular: Positive for chest pain. Negative for palpitations.  Gastrointestinal: Positive for abdominal pain (epigastric) and constipation. Negative for anal bleeding, blood in stool, nausea and vomiting.  Genitourinary: Negative for dysuria and hematuria.  Musculoskeletal: Negative for arthralgias and back pain.  Skin: Negative for color change and rash.  Neurological: Negative for seizures and syncope.  All other systems reviewed and are negative.    Physical Exam Updated Vital Signs BP (!) 145/48   Pulse 78   Temp 98.3 F (36.8 C) (Oral)   Resp 17   SpO2 100%   Physical Exam  Constitutional: He is oriented to person, place, and time. He appears well-developed and well-nourished.  HENT:  Head: Normocephalic and atraumatic.  Eyes: Conjunctivae and EOM are normal. Pupils are equal, round, and reactive to light.  Neck: Neck supple.  Cardiovascular: Normal rate and regular rhythm.  Pulmonary/Chest: Effort normal and breath sounds normal. No respiratory distress.  Abdominal: Soft. There is tenderness (mild epigastric).  Genitourinary: Rectal exam shows guaiac positive stool.  Musculoskeletal: He exhibits edema (2+ BLE).  Neurological: He is alert and oriented to person, place, and time.  Skin: Skin is warm and dry.  Psychiatric: He has a normal mood and affect.  Nursing note and vitals reviewed.    ED  Treatments / Results  Labs (all labs ordered are listed, but only abnormal results are displayed) Labs Reviewed  BASIC METABOLIC PANEL - Abnormal; Notable for the following components:      Result Value   Glucose, Bld 170 (*)    BUN 30 (*)    Creatinine, Ser 1.33 (*)    Calcium 8.4 (*)    GFR calc non Af Amer 52 (*)    All other components within normal limits  CBC - Abnormal; Notable for the following components:   RBC 2.22 (*)    Hemoglobin 5.6 (*)    HCT 18.9 (*)    MCH 25.2 (*)    MCHC 29.6 (*)    RDW 17.2 (*)    Platelets 147 (*)    All other components within normal limits  HEPATIC FUNCTION PANEL  I-STAT TROPONIN, ED  POC OCCULT BLOOD, ED  PREPARE RBC (CROSSMATCH)    EKG  EKG Interpretation  Date/Time:  Friday September 29 2017 18:09:38 EST Ventricular Rate:  73 PR Interval:  196 QRS Duration: 88 QT Interval:  392 QTC Calculation: 431 R Axis:   78 Text Interpretation:  Normal sinus rhythm Low voltage QRS Cannot rule out Anterior infarct , age undetermined Abnormal ECG when compared to prior, no significant changes seen.  No STEMI Confirmed by Antony Blackbird [phone]) on 09/29/2017 8:16:20 PM       Radiology Dg Chest 2 View  Result Date: 09/29/2017 CLINICAL DATA:  Chest pain, shortness of Breath EXAM: CHEST  2 VIEW COMPARISON:  09/02/2017 FINDINGS: Prior CABG. Heart is mildly enlarged. Peribronchial thickening and interstitial prominence, likely bronchitic changes. IMPRESSION: Borderline heart size.  Bronchitic changes. Electronically Signed   By: Rolm Baptise M.D.   On: 09/29/2017 18:49    Procedures Procedures (including critical care time)  Medications Ordered in ED Medications  0.9 %  sodium chloride infusion (not administered)  pantoprazole (PROTONIX) injection 40 mg (not administered)     Initial Impression / Assessment and Plan / ED Course  I have reviewed the triage vital signs and the nursing notes.  Pertinent labs & imaging results that were  available during my care of the patient were reviewed by me and considered in my medical decision making (see chart for details).     Mr. Colavito is a 72 year old male with a past medical history significant for CAD, CHF, GI bleed, cirrhosis who presents for dyspnea on exertion and chest pain.  EKG obtained, personally reviewed by me, demonstrates normal sinus rhythm with no significant changes from prior.  Labs obtained are significant for negative troponin, hemoglobin of 5.6.  Rectal exam performed and Hemoccult is positive.  Chest x-ray obtained, personally reviewed by me, demonstrates borderline cardiomegaly.  Patient is consented and blood transfusion ordered.  Rocephin given. Protonix given.  Patient has been seen in the past by [facility]. The on-call physician was contacted and agrees with plan for transfusion, Protonix, and abx.  Patient admitted to hospitalist.  Final Clinical Impressions(s) / ED Diagnoses   Final diagnoses:  Anemia, unspecified type  Upper GI bleed    ED Discharge Orders    None       Elveria Rising, MD 09/29/17 2353    Tegeler, Gwenyth Allegra, MD 09/30/17 1326

## 2017-09-30 ENCOUNTER — Inpatient Hospital Stay (HOSPITAL_COMMUNITY): Payer: PPO | Admitting: Certified Registered Nurse Anesthetist

## 2017-09-30 ENCOUNTER — Encounter (HOSPITAL_COMMUNITY)
Admission: EM | Disposition: A | Discharge status: Home Health | Payer: Self-pay | Source: Home / Self Care | Attending: Internal Medicine

## 2017-09-30 ENCOUNTER — Encounter (HOSPITAL_COMMUNITY): Payer: Self-pay | Admitting: *Deleted

## 2017-09-30 ENCOUNTER — Other Ambulatory Visit: Payer: Self-pay

## 2017-09-30 HISTORY — PX: ESOPHAGOGASTRODUODENOSCOPY (EGD) WITH PROPOFOL: SHX5813

## 2017-09-30 LAB — CBC
HEMATOCRIT: 24.2 % — AB (ref 39.0–52.0)
HEMOGLOBIN: 7.5 g/dL — AB (ref 13.0–17.0)
MCH: 26.6 pg (ref 26.0–34.0)
MCHC: 31 g/dL (ref 30.0–36.0)
MCV: 85.8 fL (ref 78.0–100.0)
Platelets: 125 10*3/uL — ABNORMAL LOW (ref 150–400)
RBC: 2.82 MIL/uL — AB (ref 4.22–5.81)
RDW: 16.3 % — ABNORMAL HIGH (ref 11.5–15.5)
WBC: 5.1 10*3/uL (ref 4.0–10.5)

## 2017-09-30 LAB — BASIC METABOLIC PANEL
ANION GAP: 10 (ref 5–15)
BUN: 24 mg/dL — ABNORMAL HIGH (ref 6–20)
CHLORIDE: 104 mmol/L (ref 101–111)
CO2: 23 mmol/L (ref 22–32)
Calcium: 8.1 mg/dL — ABNORMAL LOW (ref 8.9–10.3)
Creatinine, Ser: 1.35 mg/dL — ABNORMAL HIGH (ref 0.61–1.24)
GFR calc Af Amer: 59 mL/min — ABNORMAL LOW (ref >60)
GFR, EST NON AFRICAN AMERICAN: 51 mL/min — AB (ref >60)
Glucose, Bld: 146 mg/dL — ABNORMAL HIGH (ref 65–99)
POTASSIUM: 4.5 mmol/L (ref 3.5–5.1)
SODIUM: 137 mmol/L (ref 135–145)

## 2017-09-30 LAB — GLUCOSE, CAPILLARY
Glucose-Capillary: 139 mg/dL — ABNORMAL HIGH (ref 65–99)
Glucose-Capillary: 135 mg/dL — ABNORMAL HIGH (ref 65–99)
Glucose-Capillary: 189 mg/dL — ABNORMAL HIGH (ref 65–99)
Glucose-Capillary: 150 mg/dL — ABNORMAL HIGH (ref 65–99)

## 2017-09-30 LAB — BRAIN NATRIURETIC PEPTIDE: B Natriuretic Peptide: 254.8 pg/mL — ABNORMAL HIGH (ref 0.0–100.0)

## 2017-09-30 LAB — PREPARE RBC (CROSSMATCH)

## 2017-09-30 SURGERY — ESOPHAGOGASTRODUODENOSCOPY (EGD) WITH PROPOFOL
Anesthesia: Monitor Anesthesia Care

## 2017-09-30 MED ORDER — ONDANSETRON HCL 4 MG/2ML IJ SOLN
INTRAMUSCULAR | Status: DC | PRN
  Administered 2017-09-30: 4 mg via INTRAVENOUS

## 2017-09-30 MED ORDER — LACTATED RINGERS IV SOLN
INTRAVENOUS | Status: DC | PRN
  Administered 2017-09-30: 11:00:00 via INTRAVENOUS

## 2017-09-30 MED ORDER — LIDOCAINE 2% (20 MG/ML) 5 ML SYRINGE
INTRAMUSCULAR | Status: DC | PRN
  Administered 2017-09-30: 100 mg via INTRAVENOUS

## 2017-09-30 MED ORDER — SODIUM CHLORIDE 0.9 % IV SOLN: Freq: Once | INTRAVENOUS | Status: DC

## 2017-09-30 MED ORDER — PROPOFOL 10 MG/ML IV BOLUS
INTRAVENOUS | Status: DC | PRN
  Administered 2017-09-30: 30 mg via INTRAVENOUS
  Administered 2017-09-30: 20 mg via INTRAVENOUS

## 2017-09-30 MED ORDER — FUROSEMIDE 10 MG/ML IJ SOLN
20.0000 mg | Freq: Once | INTRAMUSCULAR | Status: AC
  Administered 2017-09-30: 20 mg via INTRAVENOUS
  Filled 2017-09-30: qty 2

## 2017-09-30 MED ORDER — SODIUM CHLORIDE 0.9 % IV SOLN: INTRAVENOUS | Status: DC

## 2017-09-30 MED ORDER — PROPOFOL 500 MG/50ML IV EMUL
INTRAVENOUS | Status: DC | PRN
  Administered 2017-09-30: 50 ug/kg/min via INTRAVENOUS

## 2017-09-30 MED ORDER — LACTATED RINGERS IV SOLN
Freq: Once | INTRAVENOUS | Status: AC
  Administered 2017-09-30: 11:00:00 via INTRAVENOUS

## 2017-09-30 SURGICAL SUPPLY — 14 items

## 2017-09-30 NOTE — Consult Note (Signed)
Subjective:   HPI  The patient is a 72 year old [name] with a history of coronary artery disease history of CABG, congestive heart failure diabetes and cirrhosis of the liver. He has been complaining of shortness of breath over the last 2 or 3 days and epigastric pain. 2 days ago he noted a dark stool but states it cleared up. He came to the emergency department with these complaints and was evaluated and found to have a hemoglobin of 5.6 and heme positive stool. We were asked to see him in consultation. He states he has had ulcers in the past. Review of records shows that he has had a couple of EGDs in the past. No varices described. Did have suggestion of portal hypertensive gastropathy. States he was short of breath but he has received 3 units of packed red cells and feels better now.  Review of Systems No chest pain. Has been having shortness of breath recently.  Past Medical History:  Diagnosis Date  . Arthritis    "joints tighten up sometimes" (03/27/2104)  . Asthma   . CHF (congestive heart failure) ([place])   . Chronic low back pain   . Chronic lower back pain   . Coronary artery disease    a. 05/2012 Cath/PCI: LM 40ost, 50-60d, LAD 99p ruptured plaque (3.0x28 DES), LCX 50p/m, RCA 30-40p, 76m, EF 65-70%.  . Family history of adverse reaction to anesthesia    Pt granddaughter had PONV  . GERD (gastroesophageal reflux disease)   . Hepatic cirrhosis ([place])    a. Dx 01/2014 - CT a/p   . History of blood transfusion    "related to bleeding ulcers"  . History of concussion    1976--  NO RESIDUAL  . History of GI bleed    a. UGIB 07/2012;  b. 01/2014 admission with GIB/FOB stool req 1U prbc's->EGD showed portal gastropathy, barrett's esoph, and chronic active h. pylori gastritis.  Marland Kitchen History of gout    2007 &  2008  LEFT LEG-- NO ISSUE SINCE  . Hyperlipidemia   . Iron deficiency anemia   . Kidney stones   . Lipoma   . OA (osteoarthritis of spine)    LOWER BACK--  INTERMITTANT LEFT LEG  NUMBNESS  . OSA (obstructive sleep apnea)    PULMOLOGIST-  DR CLANCE--  MODERATE OSA  STARTED CPAP 2012--  BUT CURRENTLY HAS NOT USED PAST 6 MONTHS  . Phimosis    a. s/p circumcision 2015.  . Type 2 diabetes mellitus ([place])   . Unspecified essential hypertension    Past Surgical History:  Procedure Laterality Date  . ANKLE FRACTURE SURGERY Right 1989   "plate put in"  . APPENDECTOMY  05-16-2004   open  . CIRCUMCISION N/A 09/09/2013   Procedure: CIRCUMCISION ADULT;  Surgeon: Bernestine Amass, MD;  Location: [facility];  Service: Urology;  Laterality: N/A;  . COLECTOMY  05-16-2004  . CORONARY ANGIOPLASTY WITH STENT PLACEMENT  06/28/2012  DR COOPER   PCI W/  X1 DES to [place]. LAD/  LM  40% OSTIAL & 50-60% DISTAL /  50% PROX LCX/  30-40% PROX RCA & 50% MID RCA/   LVEF 65-70%  . CORONARY ARTERY BYPASS GRAFT N/A 03/31/2014   Procedure: CORONARY ARTERY BYPASS GRAFTING (CABG) times 3 using left internal mammary artery and right saphenous vein.;  Surgeon: Melrose Nakayama, MD;  Location: [facility];  Service: Open Heart Surgery;  Laterality: N/A;  . DOBUTAMINE STRESS ECHO  06-08-2012   MODERATE HYPOKINESIS/  ISCHEMIA MID INFERIOR WALL  . ESOPHAGOGASTRODUODENOSCOPY  08/15/2012   Procedure: ESOPHAGOGASTRODUODENOSCOPY (EGD);  Surgeon: Wonda Horner, MD;  Location: [facility] ENDOSCOPY;  Service: Endoscopy;  Laterality: N/A;  . ESOPHAGOGASTRODUODENOSCOPY N/A 02/17/2014   Procedure: ESOPHAGOGASTRODUODENOSCOPY (EGD);  Surgeon: Jeryl Columbia, MD;  Location: [facility] ENDOSCOPY;  Service: Endoscopy;  Laterality: N/A;  . HERNIA REPAIR    . INTRAOPERATIVE TRANSESOPHAGEAL ECHOCARDIOGRAM N/A 03/31/2014   Procedure: INTRAOPERATIVE TRANSESOPHAGEAL ECHOCARDIOGRAM;  Surgeon: Melrose Nakayama, MD;  Location: [place];  Service: Open Heart Surgery;  Laterality: N/A;  . LAPAROSCOPIC UMBILICAL HERNIA REPAIR W/ MESH  06-06-2011  . LEFT HEART CATHETERIZATION WITH CORONARY ANGIOGRAM N/A 03/28/2014   Procedure: LEFT HEART  CATHETERIZATION WITH CORONARY ANGIOGRAM;  Surgeon: Sinclair Grooms, MD;  Location: [facility] CATH LAB;  Service: Cardiovascular;  Laterality: N/A;  . LIPOMA EXCISION Left 08/25/2017   Procedure: EXCISION LIPOMA LEFT POSTERIOR THIGH;  Surgeon: Clovis Riley, MD;  Location: [facility];  Service: General;  Laterality: Left;  . NEPHROLITHOTOMY  1990'S  . OPEN APPENDECTOMY W/ PARTIAL CECECTOMY  05-16-2004  . PERCUTANEOUS CORONARY STENT INTERVENTION (PCI-S) N/A 06/28/2012   Procedure: PERCUTANEOUS CORONARY STENT INTERVENTION (PCI-S);  Surgeon: Sherren Mocha, MD;  Location: [facility] CATH LAB;  Service: Cardiovascular;  Laterality: N/A;   Social History   Socioeconomic History  . Marital status: Married    Spouse name: Not on file  . Number of children: Not on file  . Years of education: Not on file  . Highest education level: Not on file  Social Needs  . Financial resource strain: Not on file  . Food insecurity - worry: Not on file  . Food insecurity - inability: Not on file  . Transportation needs - medical: Not on file  . Transportation needs - non-medical: Not on file  Occupational History  . Not on file  Tobacco Use  . Smoking status: Former Smoker    Packs/day: 2.00    Years: 40.00    Pack years: 80.00    Types: Cigarettes    Last attempt to quit: 08/29/1996    Years since quitting: 21.1  . Smokeless tobacco: Never Used  Substance and Sexual Activity  . Alcohol use: Yes    Alcohol/week: 0.0 oz    Comment: occasional  . Drug use: No  . Sexual activity: Yes  Other Topics Concern  . Not on file  Social History Narrative   Lives in [place] with wife, son, and son's family.   family history includes Cancer in his brother, father, mother, sister, and sister; Colon cancer in his unknown relative; Coronary artery disease in his unknown relative; Diabetes in his unknown relative; Lung cancer in his sister.  Current Facility-Administered Medications:  .  0.9 %  sodium chloride infusion, ,  Intravenous, Once, Smith, Rondell A, MD .  acetaminophen (TYLENOL) tablet 650 mg, 650 mg, Oral, Q6H PRN **OR** acetaminophen (TYLENOL) suppository 650 mg, 650 mg, Rectal, Q6H PRN, Smith, Rondell A, MD .  insulin aspart (novoLOG) injection 0-9 Units, 0-9 Units, Subcutaneous, TID WC, Smith, Rondell A, MD .  ipratropium-albuterol (DUONEB) 0.5-2.5 (3) MG/3ML nebulizer solution 3 mL, 3 mL, Nebulization, Q4H PRN, Smith, Rondell A, MD .  ondansetron (ZOFRAN) tablet 4 mg, 4 mg, Oral, Q6H PRN **OR** ondansetron (ZOFRAN) injection 4 mg, 4 mg, Intravenous, Q6H PRN, Smith, Rondell A, MD .  pantoprazole (PROTONIX) 80 mg in sodium chloride 0.9 % 250 mL (0.32 mg/mL) infusion, 8 mg/hr, Intravenous, Continuous, Smith, Rondell A, MD, Last Rate: 25  mL/hr at 09/29/17 2335, 8 mg/hr at 09/29/17 2335 .  [START ON 10/03/2017] pantoprazole (PROTONIX) injection 40 mg, 40 mg, Intravenous, Q12H, Smith, Rondell A, MD .  umeclidinium-vilanterol (ANORO ELLIPTA) 62.5-25 MCG/INH 1 puff, 1 puff, Inhalation, Daily, Tamala Julian, Rondell A, MD, 1 puff at 09/30/17 [phone] No Known Allergies   Objective:     BP (!) 144/56   Pulse 67   Temp 99.4 F (37.4 C) (Oral)   Resp 18   Ht 5\' 4"  (1.626 m)   Wt 123 kg (271 lb 2.7 oz)   SpO2 96%   BMI 46.55 kg/m   Alert and oriented  No acute distress  Heart regular rhythm no murmurs  Lungs clear  Abdomen: Bowel sounds present, soft, nontender    Laboratory No components found for: D1    Assessment:     Upper GI bleed characterized by melena  Anemia secondary to blood loss  Other medical problems as mentioned above      Plan:     He has received 3 units of packed red cells. He is on pantoprazole. We will plan EGD. Lab Results  Component Value Date   HGB 5.6 (LL) 09/29/2017   HGB 9.2 (L) 09/02/2017   HGB 9.6 (L) 08/25/2017   HCT 18.9 (L) 09/29/2017   HCT 30.7 (L) 09/02/2017   HCT 31.6 (L) 08/25/2017   ALKPHOS John 09/29/2017   ALKPHOS 91 08/25/2017   ALKPHOS 95  06/07/2016   AST 25 09/29/2017   AST 23 08/25/2017   AST 20 06/07/2016   ALT 21 09/29/2017   ALT 17 08/25/2017   ALT 13 06/07/2016

## 2017-09-30 NOTE — Transfer of Care (Signed)
Immediate Anesthesia Transfer of Care Note  Patient: RUVIM RISKO  Procedure(s) Performed: ESOPHAGOGASTRODUODENOSCOPY (EGD) WITH PROPOFOL (N/A )  Patient Location: Endoscopy Unit  Anesthesia Type:MAC  Level of Consciousness: drowsy  Airway & Oxygen Therapy: Patient Spontanous Breathing and Patient connected to nasal cannula oxygen  Post-op Assessment: Report given to RN and Post -op Vital signs reviewed and stable  Post vital signs: Reviewed and stable  Last Vitals:  Vitals:   09/30/17 1055 09/30/17 1124  BP: 136/61 111/64  Pulse: 66 66  Resp: (!) 21 17  Temp:    SpO2: 100% 98%    Last Pain:  Vitals:   09/30/17 1124  TempSrc:   PainSc: 0-No pain         Complications: No apparent anesthesia complications

## 2017-09-30 NOTE — Anesthesia Preprocedure Evaluation (Addendum)
Anesthesia Evaluation  Patient identified by MRN, date of birth, ID band Patient awake    Airway Mallampati: II  TM Distance: >3 FB Neck ROM: Full    Dental  (+) Edentulous Upper, Edentulous Lower   Pulmonary asthma , sleep apnea , former smoker,    breath sounds clear to auscultation       Cardiovascular hypertension, + CAD, + Cardiac Stents, + CABG and +CHF   Rhythm:Regular Rate:Normal     Neuro/Psych    GI/Hepatic GERD  ,(+) Cirrhosis       , Severe GI bleed   Endo/Other  diabetes, Poorly ControlledMorbid obesity  Renal/GU Renal InsufficiencyRenal disease     Musculoskeletal   Abdominal (+) + obese,   Peds  Hematology  (+) anemia ,   Anesthesia Other Findings   Reproductive/Obstetrics                            Anesthesia Physical Anesthesia Plan  ASA: IV  Anesthesia Plan: MAC   Post-op Pain Management:    Induction: Intravenous  PONV Risk Score and Plan: 2 and Ondansetron  Airway Management Planned: Natural Airway and Nasal Cannula  Additional Equipment:   Intra-op Plan:   Post-operative Plan:   Informed Consent: I have reviewed the patients History and Physical, chart, labs and discussed the procedure including the risks, benefits and alternatives for the proposed anesthesia with the patient or authorized representative who has indicated his/her understanding and acceptance.     Plan Discussed with:   Anesthesia Plan Comments:         Anesthesia Quick Evaluation

## 2017-09-30 NOTE — Anesthesia Postprocedure Evaluation (Signed)
Anesthesia Post Note  Patient: [name]  Procedure(s) Performed: ESOPHAGOGASTRODUODENOSCOPY (EGD) WITH PROPOFOL (N/A )     Patient location during evaluation: Endoscopy Anesthesia Type: MAC Level of consciousness: awake and alert Pain management: pain level controlled Vital Signs Assessment: post-procedure vital signs reviewed and stable Respiratory status: spontaneous breathing, nonlabored ventilation, respiratory function stable and patient connected to nasal cannula oxygen Cardiovascular status: stable and blood pressure returned to baseline Postop Assessment: no apparent nausea or vomiting Anesthetic complications: no    Last Vitals:  Vitals:   09/30/17 1130 09/30/17 1156  BP: (!) 126/48 (!) 132/56  Pulse: 66 65  Resp: 19 18  Temp:  36.8 C  SpO2: 99% 98%    Last Pain:  Vitals:   09/30/17 1156  TempSrc: Oral  PainSc:                  Webber Michiels,[name] TERRILL

## 2017-09-30 NOTE — Progress Notes (Signed)
New Admission Note:   Arrival Method:   Via stretcher from the ED Mental Orientation:  A & O x 4 Telemetry:   Tele Box 5M05 Assessment: Completed Skin:  See Assessment IV:  Left forearm and right forearm Pain:   Denies Tubes:  None Safety Measures: Safety Fall Prevention Plan has been given, discussed and signed Admission: Completed [address] [place] Orientation: Patient has been orientated to the room, unit and staff.  Family:  None at bedside  Patient only has clothes, wedding band, and black watch.  He is aware of [facility] Policy regarding valuables.  He is from home with his wife, son, and grandchildren.  He is unable to read or write.  He asks that any written material be given to his son.  Orders have been reviewed and implemented. Will continue to monitor the patient. Call light has been placed within reach and bed alarm has been activated.   Earleen Reaper RN- BC, [facility] Phone number: [phone]

## 2017-09-30 NOTE — Progress Notes (Signed)
PROGRESS NOTE    [name]  WFU:[id number] DOB: April 15, 1946 DOA: 09/29/2017 PCP: [name], MD    Brief Narrative: [name] is a 72 y.o. male with medical history significant of CAD s/p CABG, CHF, DM type II, hepatic cirrhosis, upper GI bleed followed Dr. Watt Climes, and OSA not on CPAP; who presents with complaints of intermittent shortness of breath and epigastric pain over the last 2-3 days.  History is obtained from the patient who reports being unable to read right at baseline and notes that his medications are managed by his son.  He was seen last week by his PCP and started on prednisone and possibly doxycycline for what he reports is bronchitis.  He reports improvement of his cough since that time, but reports a [place]-like epigastric pain that makes it feel like he cannot take catch his breath.  Associated symptoms include lightheadedness, generalized weakness, intermittent tingling, shortness of breath with exertion, leg swelling which she reports is chronic and unchanged.He reports previous history of GI bleed many years ago and states that his epigastric pain feels similar to when he was noted to have an ulcer.  Denies trying anything specifically to relieve symptoms.  Denies any chest pain, loss of consciousness, or NSAID use.  Last EGD appears to have been performed back in 01/2014 showing possible portal gastropathy and signs of Barrett's esophagus.  ED Course: Upon admission to the emergency department patient was noted to be afebrile, pulse 72-78, respirations 17-20, blood pressures maintained, and O2 saturations 100% on room air.  Labs revealed hemoglobin 5.6, platelets 147, BUN 38, creatinine 1.33, glucose 170, and troponin 0.01.  Patient was noted to have dark stools that were guaiac positive on rectal exam.  Chest x-ray showed borderline heart size with bronchiectasis changes.  Patient was typed & screened, order to be transfused 2 units of blood, given 1 g of ceftriaxone  IV,  and 40 mg of Protonix IV.  [facility] was consulted and will see the patient in a.m.  TR[facility] called to admit.    Assessment & Plan:   Active Problems:   Type 2 diabetes with complication (HCC)   Coronary artery disease    Hepatic cirrhosis (HCC)   S/P CABG x 3   GI bleed   1-Upper GI bleed, acute.  Hb at 5 on admission. He has received 3 units PRBC>  Endoscopy showed erosive gastropathy and duodenal ulcer.  Hb at 7 post transfusion. Will transfuse another unit.   AKI; in setting of anemia, hypoperfusion.  Stable.  Hold cozaar.   History of CHF;  Received IV lasix with transfusion.  Resume oral lasix. Low dose. Tomorrow/   Hepatic cirrhosis;  Diabetes mellitus type 2: Well controlled last hemoglobin A1c noted to be 6.5 on 08/25/2017. - Hypoglycemic protocols - Hold metformin - CBGs q. before meals with sensitive SSI  CAD s/p CABG - Hold aspirin 2/2 gi bleed   Essential hypertension:  - Hold blood pressure medications  History of asthma/COPD - Continue Anoro Ellipta - DuoNeb's prn shortness of breath/wheezing  DVT prophylaxis: scd Code Status: full code.  Family Communication: Disposition Plan: home when stable.   Consultants:   [facility], [facility].    Procedures:  Endoscopy; - Normal esophagus.                      - Z-line irregular, 44 cm from the incisors.                        -  Erosive gastropathy.                        - One duodenal ulcer.                         - No specimens collected.         Antimicrobials: none   Subjective: He denies abdominal pain. Breathing better   Objective: Vitals:   09/30/17 1055 09/30/17 1124 09/30/17 1130 09/30/17 1156  BP: 136/61 111/64 (!) 126/48 (!) 132/56  Pulse: 66 66 66 65  Resp: (!) 21 17 19 18   Temp:  98.3 F (36.8 C)  98.3 F (36.8 C)  TempSrc:  Oral  Oral  SpO2: 100% 98% 99% 98%  Weight:      Height:        Intake/Output Summary (Last 24 hours) at 09/30/2017 1237 Last data filed at  09/30/2017 0900 Gross per 24 hour  Intake 1748.17 ml  Output 0 ml  Net 1748.17 ml   Filed Weights   09/29/17 2139 09/30/17 0216  Weight: 123 kg (271 lb 1.6 oz) 123 kg (271 lb 2.7 oz)    Examination:  General exam: Appears calm and comfortable  Respiratory system: Clear to auscultation. Respiratory effort normal. Cardiovascular system: S1 & S2 heard, RRR. No JVD, murmurs, rubs, gallops or clicks. Plus 2 pedal edema. Gastrointestinal system: Abdomen is nondistended, soft and nontender. No organomegaly or masses felt. Normal bowel sounds heard. Central nervous system: Alert and oriented. No focal neurological deficits. Extremities: Symmetric 5 x 5 power. Skin: No rashes, lesions or ulcers   Data Reviewed: I have personally reviewed following labs and imaging studies  CBC: Recent Labs  Lab 09/29/17 1819 09/30/17 1145  WBC 5.3 5.1  HGB 5.6* 7.5*  HCT 18.9* 24.2*  MCV 85.1 85.8  PLT 147* 947*   Basic Metabolic Panel: Recent Labs  Lab 09/29/17 1819  NA 137  K 4.3  CL 102  CO2 23  GLUCOSE 170*  BUN 30*  CREATININE 1.33*  CALCIUM 8.4*   GFR: Estimated Creatinine Clearance: 61 mL/min (A) (by C-G formula based on SCr of 1.33 mg/dL (H)). Liver Function Tests: Recent Labs  Lab 09/29/17 2032  AST 25  ALT 21  ALKPHOS 92  BILITOT 0.8  PROT 6.5  ALBUMIN 3.3*   No results for input(s): LIPASE, AMYLASE in the last 168 hours. No results for input(s): AMMONIA in the last 168 hours. Coagulation Profile: No results for input(s): INR, PROTIME in the last 168 hours. Cardiac Enzymes: No results for input(s): CKTOTAL, CKMB, CKMBINDEX, TROPONINI in the last 168 hours. BNP (last 3 results) No results for input(s): PROBNP in the last 8760 hours. HbA1C: No results for input(s): HGBA1C in the last 72 hours. CBG: Recent Labs  Lab 09/30/17 0750 09/30/17 1152  GLUCAP 139* 135*   Lipid Profile: No results for input(s): CHOL, HDL, LDLCALC, TRIG, CHOLHDL, LDLDIRECT in the last  72 hours. Thyroid Function Tests: No results for input(s): TSH, T4TOTAL, FREET4, T3FREE, THYROIDAB in the last 72 hours. Anemia Panel: No results for input(s): VITAMINB12, FOLATE, FERRITIN, TIBC, IRON, RETICCTPCT in the last 72 hours. Sepsis Labs: No results for input(s): PROCALCITON, LATICACIDVEN in the last 168 hours.  No results found for this or any previous visit (from the past 240 hour(s)).       Radiology Studies: Dg Chest 2 View  Result Date: 09/29/2017 CLINICAL DATA:  Chest pain, shortness of  Breath EXAM: CHEST  2 VIEW COMPARISON:  09/02/2017 FINDINGS: Prior CABG. Heart is mildly enlarged. Peribronchial thickening and interstitial prominence, likely bronchitic changes. IMPRESSION: Borderline heart size.  Bronchitic changes. Electronically Signed   By: Rolm Baptise M.D.   On: 09/29/2017 18:49        Scheduled Meds: . insulin aspart  0-9 Units Subcutaneous TID WC  . [START ON 10/03/2017] pantoprazole  40 mg Intravenous Q12H  . umeclidinium-vilanterol  1 puff Inhalation Daily   Continuous Infusions: . sodium chloride    . pantoprozole (PROTONIX) infusion 8 mg/hr (09/30/17 1157)     LOS: 1 day    Time spent: 35 minutes.     Elmarie Shiley, MD [facility] Pager [phone]  If 7PM-7AM, please contact night-coverage [email or web address] Password [facility] 09/30/2017, 12:38 PM

## 2017-09-30 NOTE — Op Note (Signed)
[facility] Patient Name: [name] Procedure Date : 09/30/2017 MRN: [id number] Attending MD: Wonda Horner , MD Date of Birth: Nov 22, 1945 CSN: [id number] Age: 72 Admit Type: Inpatient Procedure:                Upper GI endoscopy Indications:              Epigastric abdominal pain, Acute post hemorrhagic                            anemia, Melena Providers:                Wonda Horner, MD, Presley Raddle, RN, Vista Lawman, RN, Alan Mulder, Technician Referring MD:              Medicines:                Propofol per Anesthesia Complications:            No immediate complications. Estimated Blood Loss:     Estimated blood loss: none. Procedure:                Pre-Anesthesia Assessment:                           - Prior to the procedure, a History and Physical                            was performed, and patient medications and                            allergies were reviewed. The patient's tolerance of                            previous anesthesia was also reviewed. The risks                            and benefits of the procedure and the sedation                            options and risks were discussed with the patient.                            All questions were answered, and informed consent                            was obtained. Prior Anticoagulants: The patient has                            taken no previous anticoagulant or antiplatelet                            agents. ASA Grade Assessment: III - A patient with  severe systemic disease. After reviewing the risks                            and benefits, the patient was deemed in                            satisfactory condition to undergo the procedure.                           After obtaining informed consent, the endoscope was                            passed under direct vision. Throughout the                            procedure, the  patient's blood pressure, pulse, and                            oxygen saturations were monitored continuously. The                            EG-[id number] ([id number]) scope was introduced through the                            mouth, and advanced to the second part of duodenum.                            The upper GI endoscopy was accomplished without                            difficulty. The patient tolerated the procedure                            well. Scope In: Scope Out: Findings:      The examined esophagus was normal. No varices.      The Z-line was irregular and was found 44 cm from the incisors.      A few diminutive erosions were found in the gastric body.      One small duodenal ulcer was found in the first portion of the duodenum. Impression:               - Normal esophagus.                           - Z-line irregular, 44 cm from the incisors.                           - Erosive gastropathy.                           - One duodenal ulcer.                           - No specimens collected. Moderate Sedation:      . Recommendation:           -  Resume regular diet.                           - Continue present medications. Procedure Code(s):        --- Professional ---                           [phone], Esophagogastroduodenoscopy, flexible,                            transoral; diagnostic, including collection of                            specimen(s) by brushing or washing, when performed                            (separate procedure) Diagnosis Code(s):        --- Professional ---                           K22.8, Other specified diseases of esophagus                           K31.89, Other diseases of stomach and duodenum                           K26.9, Duodenal ulcer, unspecified as acute or                            chronic, without hemorrhage or perforation                           R10.13, Epigastric pain                           D62, Acute posthemorrhagic anemia                            K92.1, Melena (includes Hematochezia) CPT copyright 2016 American Medical Association. All rights reserved. The codes documented in this report are preliminary and upon coder review may  be revised to meet current compliance requirements. Wonda Horner, MD 09/30/2017 11:27:55 AM This report has been signed electronically. Number of Addenda: 0

## 2017-10-01 ENCOUNTER — Encounter (HOSPITAL_COMMUNITY): Payer: Self-pay | Admitting: Gastroenterology

## 2017-10-01 ENCOUNTER — Inpatient Hospital Stay (HOSPITAL_COMMUNITY): Payer: PPO

## 2017-10-01 DIAGNOSIS — R0602 Shortness of breath: Secondary | ICD-10-CM

## 2017-10-01 LAB — BASIC METABOLIC PANEL
ANION GAP: 11 (ref 5–15)
BUN: 22 mg/dL — AB (ref 6–20)
CO2: 21 mmol/L — ABNORMAL LOW (ref 22–32)
Calcium: 8.2 mg/dL — ABNORMAL LOW (ref 8.9–10.3)
Chloride: 103 mmol/L (ref 101–111)
Creatinine, Ser: 1.46 mg/dL — ABNORMAL HIGH (ref 0.61–1.24)
GFR, EST AFRICAN AMERICAN: 54 mL/min — AB (ref >60)
GFR, EST NON AFRICAN AMERICAN: 47 mL/min — AB (ref >60)
Glucose, Bld: 205 mg/dL — ABNORMAL HIGH (ref 65–99)
POTASSIUM: 4.4 mmol/L (ref 3.5–5.1)
SODIUM: 135 mmol/L (ref 135–145)

## 2017-10-01 LAB — GLUCOSE, CAPILLARY
GLUCOSE-CAPILLARY: 137 mg/dL — AB (ref 65–99)
GLUCOSE-CAPILLARY: 150 mg/dL — AB (ref 65–99)
GLUCOSE-CAPILLARY: 164 mg/dL — AB (ref 65–99)
GLUCOSE-CAPILLARY: 168 mg/dL — AB (ref 65–99)

## 2017-10-01 LAB — CBC
HCT: 26.2 % — ABNORMAL LOW (ref 39.0–52.0)
HEMOGLOBIN: 8.1 g/dL — AB (ref 13.0–17.0)
MCH: 26.5 pg (ref 26.0–34.0)
MCHC: 30.9 g/dL (ref 30.0–36.0)
MCV: 85.6 fL (ref 78.0–100.0)
Platelets: 131 10*3/uL — ABNORMAL LOW (ref 150–400)
RBC: 3.06 MIL/uL — AB (ref 4.22–5.81)
RDW: 16.7 % — ABNORMAL HIGH (ref 11.5–15.5)
WBC: 5.5 10*3/uL (ref 4.0–10.5)

## 2017-10-01 MED ORDER — FERROUS SULFATE 325 (65 FE) MG PO TABS
325.0000 mg | ORAL_TABLET | Freq: Two times a day (BID) | ORAL | Status: DC
  Administered 2017-10-01 – 2017-10-05 (×8): 325 mg via ORAL
  Filled 2017-10-01 (×8): qty 1

## 2017-10-01 MED ORDER — POLYETHYLENE GLYCOL 3350 17 G PO PACK
17.0000 g | PACK | Freq: Two times a day (BID) | ORAL | Status: DC
  Administered 2017-10-01 – 2017-10-05 (×8): 17 g via ORAL
  Filled 2017-10-01 (×8): qty 1

## 2017-10-01 MED ORDER — FUROSEMIDE 10 MG/ML IJ SOLN
40.0000 mg | Freq: Two times a day (BID) | INTRAMUSCULAR | Status: DC
  Administered 2017-10-01 – 2017-10-02 (×2): 40 mg via INTRAVENOUS
  Filled 2017-10-01 (×2): qty 4

## 2017-10-01 NOTE — Progress Notes (Addendum)
PROGRESS NOTE    [name]  NUU:[id number] DOB: [birth year] DOA: 09/29/2017 PCP: [name], MD    Brief Narrative: [name] is a 72 y.o. male with medical history significant of CAD s/p CABG, CHF, DM type II, hepatic cirrhosis, upper GI bleed followed Dr. Watt Climes, and OSA not on CPAP; who presents with complaints of intermittent shortness of breath and epigastric pain over the last 2-3 days.  History is obtained from the patient who reports being unable to read right at baseline and notes that his medications are managed by his son.  He was seen last week by his PCP and started on prednisone and possibly doxycycline for what he reports is bronchitis.  He reports improvement of his cough since that time, but reports a [place]-like epigastric pain that makes it feel like he cannot take catch his breath.  Associated symptoms include lightheadedness, generalized weakness, intermittent tingling, shortness of breath with exertion, leg swelling which she reports is chronic and unchanged.He reports previous history of GI bleed many years ago and states that his epigastric pain feels similar to when he was noted to have an ulcer.  Denies trying anything specifically to relieve symptoms.  Denies any chest pain, loss of consciousness, or NSAID use.  Last EGD appears to have been performed back in 01/2014 showing possible portal gastropathy and signs of Barrett's esophagus.  ED Course: Upon admission to the emergency department patient was noted to be afebrile, pulse 72-78, respirations 17-20, blood pressures maintained, and O2 saturations 100% on room air.  Labs revealed hemoglobin 5.6, platelets 147, BUN 38, creatinine 1.33, glucose 170, and troponin 0.01.  Patient was noted to have dark stools that were guaiac positive on rectal exam.  Chest x-ray showed borderline heart size with bronchiectasis changes.  Patient was typed & screened, order to be transfused 2 units of blood, given 1 g of ceftriaxone  IV,  and 40 mg of Protonix IV.  [facility] was consulted and will see the patient in a.m.  TR[facility] called to admit.    Assessment & Plan:   Active Problems:   Type 2 diabetes with complication (HCC)   Coronary artery disease    Hepatic cirrhosis (HCC)   S/P CABG x 3   GI bleed   1-Upper GI bleed, acute.  Hb at 5 on admission. He has received 4 units PRBC> during this admission.  Endoscopy showed erosive gastropathy and duodenal ulcer.  Hb stable [facility] at [facility] ferrous sulfate.   AKI; in setting of anemia, hypoperfusion.  Stable.  Hold cozaar.  Monitor on lasix.   Acute on History of CHF;  He is having SOB on exertion. He has plus 2 edema LE>  I will start IV lasix 40 mg IV BID. Monitor renal function closely  Chest x ray with cardiomegaly and bilateral asymmetric subpleural, patchy opacities.   Will get ct chest  Weight 271---  Mild subpleural/patchy opacities, lower lobe predominant. Will get CT chest .   Hepatic cirrhosis;  Diabetes mellitus type 2: Well controlled last hemoglobin A1c noted to be 6.5 on 08/25/2017. - Hypoglycemic protocols - Hold metformin - CBGs q. before meals with sensitive SSI  CAD s/p CABG - Hold aspirin 2/2 gi bleed   Essential hypertension:  - Hold blood pressure medications  History of asthma/COPD - Continue Anoro Ellipta - DuoNeb's prn shortness of breath/wheezing  DVT prophylaxis: scd Code Status: full code.  Family Communication: Disposition Plan: home when stable.  Consultants:   [facility], [facility].    Procedures:  Endoscopy; - Normal esophagus.                      - Z-line irregular, 44 cm from the incisors.                        - Erosive gastropathy.                        - One duodenal ulcer.                         - No specimens collected.         Antimicrobials: none   Subjective: He just cam from bathroom, and he is very SOB, denies chest pain. He has been having similar problems at home worse lately     Objective: Vitals:   09/30/17 1635 09/30/17 2125 10/01/17 0826 10/01/17 0900  BP: (!) 125/49 (!) 154/57  (!) 121/53  Pulse: 60 88  60  Resp: (!) 22 (!) 21  18  Temp: 98.3 F (36.8 C) 98.5 F (36.9 C)  98.3 F (36.8 C)  TempSrc: Oral Oral  Oral  SpO2: 100% 100% 92% 94%  Weight:  123.2 kg (271 lb 9.7 oz)    Height:        Intake/Output Summary (Last 24 hours) at 10/01/2017 1312 Last data filed at 10/01/2017 0900 Gross per 24 hour  Intake 1569.33 ml  Output 1465 ml  Net 104.33 ml   Filed Weights   09/29/17 2139 09/30/17 0216 09/30/17 2125  Weight: 123 kg (271 lb 1.6 oz) 123 kg (271 lb 2.7 oz) 123.2 kg (271 lb 9.7 oz)    Examination:  General exam: mild distress, due to dyspnea.  Respiratory system: tachypnea, bilateral cackles.  Cardiovascular system: S 1, S 2 RRR, plus 2 edema.  Gastrointestinal system: BS present, soft, nt Central nervous system: non focal.  Extremities: symmetric power.  Skin: No rashes, lesions or ulcers   Data Reviewed: I have personally reviewed following labs and imaging studies  CBC: Recent Labs  Lab 09/29/17 1819 09/30/17 1145 10/01/17 0928  WBC 5.3 5.1 5.5  HGB 5.6* 7.5* 8.1*  HCT 18.9* 24.2* 26.2*  MCV 85.1 85.8 85.6  PLT 147* 125* 569*   Basic Metabolic Panel: Recent Labs  Lab 09/29/17 1819 09/30/17 1145 10/01/17 0928  NA 137 137 135  K 4.3 4.5 4.4  CL 102 104 103  CO2 23 23 21*  GLUCOSE 170* 146* 205*  BUN 30* 24* 22*  CREATININE 1.33* 1.35* 1.46*  CALCIUM 8.4* 8.1* 8.2*   GFR: Estimated Creatinine Clearance: 55.7 mL/min (A) (by C-G formula based on SCr of 1.46 mg/dL (H)). Liver Function Tests: Recent Labs  Lab 09/29/17 2032  AST 25  ALT 21  ALKPHOS 92  BILITOT 0.8  PROT 6.5  ALBUMIN 3.3*   No results for input(s): LIPASE, AMYLASE in the last 168 hours. No results for input(s): AMMONIA in the last 168 hours. Coagulation Profile: No results for input(s): INR, PROTIME in the last 168 hours. Cardiac  Enzymes: No results for input(s): CKTOTAL, CKMB, CKMBINDEX, TROPONINI in the last 168 hours. BNP (last 3 results) No results for input(s): PROBNP in the last 8760 hours. HbA1C: No results for input(s): HGBA1C in the last 72 hours. CBG: Recent Labs  Lab 09/30/17 1152 09/30/17 1643 09/30/17 2117 10/01/17  0750 10/01/17 1202  GLUCAP 135* 189* 150* 137* 150*   Lipid Profile: No results for input(s): CHOL, HDL, LDLCALC, TRIG, CHOLHDL, LDLDIRECT in the last 72 hours. Thyroid Function Tests: No results for input(s): TSH, T4TOTAL, FREET4, T3FREE, THYROIDAB in the last 72 hours. Anemia Panel: No results for input(s): VITAMINB12, FOLATE, FERRITIN, TIBC, IRON, RETICCTPCT in the last 72 hours. Sepsis Labs: No results for input(s): PROCALCITON, LATICACIDVEN in the last 168 hours.  No results found for this or any previous visit (from the past 240 hour(s)).       Radiology Studies: Dg Chest 2 View  Result Date: 09/29/2017 CLINICAL DATA:  Chest pain, shortness of Breath EXAM: CHEST  2 VIEW COMPARISON:  09/02/2017 FINDINGS: Prior CABG. Heart is mildly enlarged. Peribronchial thickening and interstitial prominence, likely bronchitic changes. IMPRESSION: Borderline heart size.  Bronchitic changes. Electronically Signed   By: Rolm Baptise M.D.   On: 09/29/2017 18:49   Dg Chest Port 1 View  Result Date: 10/01/2017 CLINICAL DATA:  Shortness of breath EXAM: PORTABLE CHEST 1 VIEW COMPARISON:  09/29/2016 FINDINGS: Mild reticulonodular opacities in the lungs bilaterally, with a peripheral/subpleural and lower lobe predominance. While infection is possible, this appearance also could reflect chronic interstitial lung disease given the appearance. However, this is progressive from January 2019 and new from 2015. Small bilateral pleural effusions. Cardiomegaly. Pulmonary vascular congestion without frank interstitial edema. Postsurgical changes related to prior CABG. Median sternotomy. IMPRESSION: Mild  subpleural/patchy opacities, lower lobe predominant. Multifocal infection is possible. However, in the absence of infectious symptoms, progressive chronic interstitial lung disease is favored. Cardiomegaly with pulmonary vascular congestion. No frank interstitial edema. Small bilateral pleural effusions. Further evaluation of these findings depends on the clinical scenario. If infectious symptoms are favored, follow-up chest radiographs are suggested in 3-4 weeks after appropriate antimicrobial therapy. If infectious symptoms are not favored, consider routine CT chest without contrast at this time. Regardless, if this appearance persists, outpatient high-resolution CT chest may be beneficial, but this should not be pursued in the acute emergent/inpatient clinical setting. Electronically Signed   By: Julian Hy M.D.   On: 10/01/2017 10:47        Scheduled Meds: . furosemide  40 mg Intravenous BID  . insulin aspart  0-9 Units Subcutaneous TID WC  . [START ON 10/03/2017] pantoprazole  40 mg Intravenous Q12H  . umeclidinium-vilanterol  1 puff Inhalation Daily   Continuous Infusions: . sodium chloride    . sodium chloride Stopped (09/30/17 1715)  . pantoprozole (PROTONIX) infusion 8 mg/hr (10/01/17 0949)     LOS: 2 days    Time spent: 35 minutes.     Elmarie Shiley, MD [facility] Pager [phone]  If 7PM-7AM, please contact night-coverage [email or web address] Password [email or web address] 10/01/2017, 1:12 PM

## 2017-10-01 NOTE — Progress Notes (Signed)
[facility] Progress Note  Subjective: The patient is doing well today from a GI standpoint. No further signs of gastrointestinal bleeding. Endoscopy yesterday showed a small duodenal ulcer and erosive gastropathy with no evidence of active bleeding on the exam. He is eating. He denies abdominal pain.  Objective: Vital signs in last 24 hours: Temp:  [98 F (36.7 C)-98.5 F (36.9 C)] 98.3 F (36.8 C) (02/03 0900) Pulse Rate:  [60-88] 60 (02/03 0900) Resp:  [17-22] 18 (02/03 0900) BP: (111-154)/(38-64) 121/53 (02/03 0900) SpO2:  [92 %-100 %] 94 % (02/03 0900) Weight:  [123.2 kg (271 lb 9.7 oz)] 123.2 kg (271 lb 9.7 oz) (02/02 2125) Weight change: 0.23 kg (8.1 oz)   PE:  No distress  Heart regular rhythm  Abdomen soft and nontender  Lab Results: Results for orders placed or performed during the hospital encounter of 09/29/17 (from the past 24 hour(s))  Basic metabolic panel     Status: Abnormal   Collection Time: 09/30/17 11:45 AM  Result Value Ref Range   Sodium 137 135 - 145 mmol/L   Potassium 4.5 3.5 - 5.1 mmol/L   Chloride 104 101 - 111 mmol/L   CO2 23 22 - 32 mmol/L   Glucose, Bld 146 (H) 65 - 99 mg/dL   BUN 24 (H) 6 - 20 mg/dL   Creatinine, Ser 1.35 (H) 0.61 - 1.24 mg/dL   Calcium 8.1 (L) 8.9 - 10.3 mg/dL   GFR calc non Af Amer 51 (L) >60 mL/min   GFR calc Af Amer 59 (L) >60 mL/min   Anion gap 10 5 - 15  CBC     Status: Abnormal   Collection Time: 09/30/17 11:45 AM  Result Value Ref Range   WBC 5.1 4.0 - 10.5 K/uL   RBC 2.82 (L) 4.22 - 5.81 MIL/uL   Hemoglobin 7.5 (L) 13.0 - 17.0 g/dL   HCT 24.2 (L) 39.0 - 52.0 %   MCV 85.8 78.0 - 100.0 fL   MCH 26.6 26.0 - 34.0 pg   MCHC 31.0 30.0 - 36.0 g/dL   RDW 16.3 (H) 11.5 - 15.5 %   Platelets 125 (L) 150 - 400 K/uL  Glucose, capillary     Status: Abnormal   Collection Time: 09/30/17 11:52 AM  Result Value Ref Range   Glucose-Capillary 135 (H) 65 - 99 mg/dL  BLOOD TRANSFUSION REPORT - SCANNED     Status:  None   Collection Time: 09/30/17 12:16 PM   Narrative   Ordered by an unspecified provider.  Prepare RBC     Status: None   Collection Time: 09/30/17  1:30 PM  Result Value Ref Range   Order Confirmation      BB SAMPLE OR UNITS ALREADY AVAILABLE Performed at [facility], [place] [address] [place], [place] [address]   Glucose, capillary     Status: Abnormal   Collection Time: 09/30/17  4:43 PM  Result Value Ref Range   Glucose-Capillary 189 (H) 65 - 99 mg/dL  Glucose, capillary     Status: Abnormal   Collection Time: 09/30/17  9:17 PM  Result Value Ref Range   Glucose-Capillary 150 (H) 65 - 99 mg/dL  Glucose, capillary     Status: Abnormal   Collection Time: 10/01/17  7:50 AM  Result Value Ref Range   Glucose-Capillary 137 (H) 65 - 99 mg/dL  CBC     Status: Abnormal   Collection Time: 10/01/17  9:28 AM  Result Value Ref Range  WBC 5.5 4.0 - 10.5 K/uL   RBC 3.06 (L) 4.22 - 5.81 MIL/uL   Hemoglobin 8.1 (L) 13.0 - 17.0 g/dL   HCT 26.2 (L) 39.0 - 52.0 %   MCV 85.6 78.0 - 100.0 fL   MCH 26.5 26.0 - 34.0 pg   MCHC 30.9 30.0 - 36.0 g/dL   RDW 16.7 (H) 11.5 - 15.5 %   Platelets 131 (L) 150 - 400 K/uL  Basic metabolic panel     Status: Abnormal   Collection Time: 10/01/17  9:28 AM  Result Value Ref Range   Sodium 135 135 - 145 mmol/L   Potassium 4.4 3.5 - 5.1 mmol/L   Chloride 103 101 - 111 mmol/L   CO2 21 (L) 22 - 32 mmol/L   Glucose, Bld 205 (H) 65 - 99 mg/dL   BUN 22 (H) 6 - 20 mg/dL   Creatinine, Ser 1.46 (H) 0.61 - 1.24 mg/dL   Calcium 8.2 (L) 8.9 - 10.3 mg/dL   GFR calc non Af Amer 47 (L) >60 mL/min   GFR calc Af Amer 54 (L) >60 mL/min   Anion gap 11 5 - 15    Studies/Results: Dg Chest Port 1 View  Result Date: 10/01/2017 CLINICAL DATA:  Shortness of breath EXAM: PORTABLE CHEST 1 VIEW COMPARISON:  09/29/2016 FINDINGS: Mild reticulonodular opacities in the lungs bilaterally, with a peripheral/subpleural and lower lobe predominance. While infection is possible,  this appearance also could reflect chronic interstitial lung disease given the appearance. However, this is progressive from January 2019 and new from 2015. Small bilateral pleural effusions. Cardiomegaly. Pulmonary vascular congestion without frank interstitial edema. Postsurgical changes related to prior CABG. Median sternotomy. IMPRESSION: Mild subpleural/patchy opacities, lower lobe predominant. Multifocal infection is possible. However, in the absence of infectious symptoms, progressive chronic interstitial lung disease is favored. Cardiomegaly with pulmonary vascular congestion. No frank interstitial edema. Small bilateral pleural effusions. Further evaluation of these findings depends on the clinical scenario. If infectious symptoms are favored, follow-up chest radiographs are suggested in 3-4 weeks after appropriate antimicrobial therapy. If infectious symptoms are not favored, consider routine CT chest without contrast at this time. Regardless, if this appearance persists, outpatient high-resolution CT chest may be beneficial, but this should not be pursued in the acute emergent/inpatient clinical setting. Electronically Signed   By: Julian Hy M.D.   On: 10/01/2017 10:47      Assessment: Blood loss anemia secondary to duodenal ulcer and erosive gastropathy  Plan:   Continue PPI therapy. Avoid aspirin and NSAIDs for now. We will sign off. Call us if needed.    SAM F Dayleen Beske 10/01/2017, 11:08 AM  Pager: [phone] If no answer or after 5 PM call [phone]

## 2017-10-02 ENCOUNTER — Inpatient Hospital Stay (HOSPITAL_COMMUNITY): Payer: PPO

## 2017-10-02 DIAGNOSIS — I351 Nonrheumatic aortic (valve) insufficiency: Secondary | ICD-10-CM

## 2017-10-02 LAB — BASIC METABOLIC PANEL
ANION GAP: 11 (ref 5–15)
BUN: 18 mg/dL (ref 6–20)
CALCIUM: 8 mg/dL — AB (ref 8.9–10.3)
CO2: 23 mmol/L (ref 22–32)
Chloride: 102 mmol/L (ref 101–111)
Creatinine, Ser: 1.31 mg/dL — ABNORMAL HIGH (ref 0.61–1.24)
GFR, EST NON AFRICAN AMERICAN: 53 mL/min — AB (ref >60)
Glucose, Bld: 115 mg/dL — ABNORMAL HIGH (ref 65–99)
POTASSIUM: 3.8 mmol/L (ref 3.5–5.1)
Sodium: 136 mmol/L (ref 135–145)

## 2017-10-02 LAB — CBC
HEMATOCRIT: 25.7 % — AB (ref 39.0–52.0)
Hemoglobin: 7.9 g/dL — ABNORMAL LOW (ref 13.0–17.0)
MCH: 26.3 pg (ref 26.0–34.0)
MCHC: 30.7 g/dL (ref 30.0–36.0)
MCV: 85.7 fL (ref 78.0–100.0)
PLATELETS: 125 10*3/uL — AB (ref 150–400)
RBC: 3 MIL/uL — AB (ref 4.22–5.81)
RDW: 16.4 % — ABNORMAL HIGH (ref 11.5–15.5)
WBC: 4.6 10*3/uL (ref 4.0–10.5)

## 2017-10-02 LAB — POCT I-STAT, CHEM 8
BUN: 24 mg/dL — ABNORMAL HIGH (ref 6–20)
CHLORIDE: 102 mmol/L (ref 101–111)
CREATININE: 1.2 mg/dL (ref 0.61–1.24)
Calcium, Ion: 1.12 mmol/L — ABNORMAL LOW (ref 1.15–1.40)
GLUCOSE: 135 mg/dL — AB (ref 65–99)
HEMATOCRIT: 24 % — AB (ref 39.0–52.0)
Hemoglobin: 8.2 g/dL — ABNORMAL LOW (ref 13.0–17.0)
POTASSIUM: 4.4 mmol/L (ref 3.5–5.1)
Sodium: 140 mmol/L (ref 135–145)
TCO2: 23 mmol/L (ref 22–32)

## 2017-10-02 LAB — ECHOCARDIOGRAM COMPLETE
Height: 64 in
Weight: 4352.76 oz

## 2017-10-02 LAB — GLUCOSE, CAPILLARY
GLUCOSE-CAPILLARY: 141 mg/dL — AB (ref 65–99)
GLUCOSE-CAPILLARY: 163 mg/dL — AB (ref 65–99)
Glucose-Capillary: 123 mg/dL — ABNORMAL HIGH (ref 65–99)
Glucose-Capillary: 115 mg/dL — ABNORMAL HIGH (ref 65–99)
Glucose-Capillary: 142 mg/dL — ABNORMAL HIGH (ref 65–99)

## 2017-10-02 MED ORDER — FUROSEMIDE 10 MG/ML IJ SOLN
40.0000 mg | Freq: Once | INTRAMUSCULAR | Status: AC
  Administered 2017-10-02: 40 mg via INTRAVENOUS
  Filled 2017-10-02: qty 4

## 2017-10-02 MED ORDER — POTASSIUM CHLORIDE CRYS ER 20 MEQ PO TBCR
40.0000 meq | EXTENDED_RELEASE_TABLET | Freq: Once | ORAL | Status: AC
  Administered 2017-10-02: 40 meq via ORAL
  Filled 2017-10-02: qty 2

## 2017-10-02 MED ORDER — FUROSEMIDE 10 MG/ML IJ SOLN
80.0000 mg | Freq: Two times a day (BID) | INTRAMUSCULAR | Status: DC
  Administered 2017-10-02 – 2017-10-05 (×6): 80 mg via INTRAVENOUS
  Filled 2017-10-02 (×6): qty 8

## 2017-10-02 NOTE — Progress Notes (Signed)
PROGRESS NOTE    [name]  XNA:[id number] DOB: 1946/07/22 DOA: 09/29/2017 PCP: [name], MD    Brief Narrative: [name] is a 72 y.o. male with medical history significant of CAD s/p CABG, CHF, DM type II, hepatic cirrhosis, upper GI bleed followed Dr. Watt Climes, and OSA not on CPAP; who presents with complaints of intermittent shortness of breath and epigastric pain over the last 2-3 days.  History is obtained from the patient who reports being unable to read right at baseline and notes that his medications are managed by his son.  He was seen last week by his PCP and started on prednisone and possibly doxycycline for what he reports is bronchitis.  He reports improvement of his cough since that time, but reports a Hazen-like epigastric pain that makes it feel like he cannot take catch his breath.  Associated symptoms include lightheadedness, generalized weakness, intermittent tingling, shortness of breath with exertion, leg swelling which she reports is chronic and unchanged.He reports previous history of GI bleed many years ago and states that his epigastric pain feels similar to when he was noted to have an ulcer.  Denies trying anything specifically to relieve symptoms.  Denies any chest pain, loss of consciousness, or NSAID use.  Last EGD appears to have been performed back in 01/2014 showing possible portal gastropathy and signs of Barrett's esophagus.  ED Course: Upon admission to the emergency department patient was noted to be afebrile, pulse 72-78, respirations 17-20, blood pressures maintained, and O2 saturations 100% on room air.  Labs revealed hemoglobin 5.6, platelets 147, BUN 38, creatinine 1.33, glucose 170, and troponin 0.01.  Patient was noted to have dark stools that were guaiac positive on rectal exam.  Chest x-ray showed borderline heart size with bronchiectasis changes.  Patient was typed & screened, order to be transfused 2 units of blood, given 1 g of ceftriaxone  IV,  and 40 mg of Protonix IV.  [facility] was consulted and will see the patient in a.m.  [facility] called to admit.    Assessment & Plan:   Active Problems:   Type 2 diabetes with complication (HCC)   Coronary artery disease    Hepatic cirrhosis (HCC)   S/P CABG x 3   GI bleed   1-Upper GI bleed, acute.  Hb at 5 on admission. He has received 4 units PRBC> during this admission.  Endoscopy showed erosive gastropathy and duodenal ulcer.  Hb decreased to 7.9, patient denies melena.  Started  ferrous sulfate.   AKI; in setting of anemia, hypoperfusion.  Stable.  Hold cozaar.  Monitor on lasix.  Cr decreased to 1.3.   Acute on History of CHF;  He is having SOB on exertion. He has plus 2 edema LE>  Chest x ray with cardiomegaly and bilateral asymmetric subpleural, patchy opacities. CT chest consistent with pulmonary edema.  Check ECHO.  Increase lasix to 80 mg IV BID>  Weight 271---272  Mild subpleural/patchy opacities, lower lobe predominant. Will get CT chest .   Hepatic cirrhosis;  Diabetes mellitus type 2: Well controlled last hemoglobin A1c noted to be 6.5 on 08/25/2017. - Hypoglycemic protocols - Hold metformin - CBGs q. before meals with sensitive SSI  CAD s/p CABG - Hold aspirin 2/2 gi bleed   Essential hypertension:  - Hold blood pressure medications  History of asthma/COPD - Continue Anoro Ellipta - DuoNeb's prn shortness of breath/wheezing  DVT prophylaxis: scd Code Status: full code.  Family Communication: Disposition Plan:  home when stable.   Consultants:   [facility], [facility].    Procedures:  Endoscopy; - Normal esophagus.                      - Z-line irregular, 44 cm from the incisors.                        - Erosive gastropathy.                        - One duodenal ulcer.                         - No specimens collected.         Antimicrobials: none   Subjective: He is breathing better today, less dyspnea on exertion.    Objective: Vitals:   10/01/17 2034 10/02/17 0404 10/02/17 0818 10/02/17 1036  BP: (!) 121/48 (!) 117/41  (!) 123/44  Pulse: 61 84  65  Resp: 19 20  19   Temp: 98.5 F (36.9 C) 98.2 F (36.8 C)  98 F (36.7 C)  TempSrc: Oral Oral  Oral  SpO2: 93% 100% 96% 96%  Weight: 123.4 kg (272 lb 0.8 oz)     Height:        Intake/Output Summary (Last 24 hours) at 10/02/2017 1341 Last data filed at 10/02/2017 1034 Gross per 24 hour  Intake 1787.5 ml  Output 1300 ml  Net 487.5 ml   Filed Weights   09/30/17 0216 09/30/17 2125 10/01/17 2034  Weight: 123 kg (271 lb 2.7 oz) 123.2 kg (271 lb 9.7 oz) 123.4 kg (272 lb 0.8 oz)    Examination:  General exam: NAD Respiratory system: Bilateral; crackles.  Cardiovascular system: S 1, S 2 RRR, plus 2 edema.  Gastrointestinal system: distended, soft, nt Central nervous system: non focal.  Extremities: symmetric power.  Skin: no rash    Data Reviewed: I have personally reviewed following labs and imaging studies  CBC: Recent Labs  Lab 09/29/17 1819 09/30/17 1052 09/30/17 1145 10/01/17 0928 10/02/17 0709  WBC 5.3  --  5.1 5.5 4.6  HGB 5.6* 8.2* 7.5* 8.1* 7.9*  HCT 18.9* 24.0* 24.2* 26.2* 25.7*  MCV 85.1  --  85.8 85.6 85.7  PLT 147*  --  125* 131* 161*   Basic Metabolic Panel: Recent Labs  Lab 09/29/17 1819 09/30/17 1052 09/30/17 1145 10/01/17 0928 10/02/17 0709  NA 137 140 137 135 136  K 4.3 4.4 4.5 4.4 3.8  CL 102 102 104 103 102  CO2 23  --  23 21* 23  GLUCOSE 170* 135* 146* 205* 115*  BUN 30* 24* 24* 22* 18  CREATININE 1.33* 1.20 1.35* 1.46* 1.31*  CALCIUM 8.4*  --  8.1* 8.2* 8.0*   GFR: Estimated Creatinine Clearance: 62.1 mL/min (A) (by C-G formula based on SCr of 1.31 mg/dL (H)). Liver Function Tests: Recent Labs  Lab 09/29/17 2032  AST 25  ALT 21  ALKPHOS 92  BILITOT 0.8  PROT 6.5  ALBUMIN 3.3*   No results for input(s): LIPASE, AMYLASE in the last 168 hours. No results for input(s): AMMONIA in the last  168 hours. Coagulation Profile: No results for input(s): INR, PROTIME in the last 168 hours. Cardiac Enzymes: No results for input(s): CKTOTAL, CKMB, CKMBINDEX, TROPONINI in the last 168 hours. BNP (last 3 results) No results for input(s): PROBNP in the last 8760 hours.  HbA1C: No results for input(s): HGBA1C in the last 72 hours. CBG: Recent Labs  Lab 10/01/17 1202 10/01/17 1635 10/01/17 2034 10/02/17 0748 10/02/17 1157  GLUCAP 150* 164* 168* 115* 142*   Lipid Profile: No results for input(s): CHOL, HDL, LDLCALC, TRIG, CHOLHDL, LDLDIRECT in the last 72 hours. Thyroid Function Tests: No results for input(s): TSH, T4TOTAL, FREET4, T3FREE, THYROIDAB in the last 72 hours. Anemia Panel: No results for input(s): VITAMINB12, FOLATE, FERRITIN, TIBC, IRON, RETICCTPCT in the last 72 hours. Sepsis Labs: No results for input(s): PROCALCITON, LATICACIDVEN in the last 168 hours.  No results found for this or any previous visit (from the past 240 hour(s)).       Radiology Studies: Ct Chest Wo Contrast  Result Date: 10/01/2017 CLINICAL DATA:  Dyspnea EXAM: CT CHEST WITHOUT CONTRAST TECHNIQUE: Multidetector CT imaging of the chest was performed following the standard protocol without IV contrast. COMPARISON:  Chest x-ray 10/01/2017 FINDINGS: Cardiovascular: No significant vascular findings. Normal heart size. No pericardial effusion. Prior CABG. Mediastinum/Nodes: No enlarged mediastinal or axillary lymph nodes. Mediastinal and bilateral hilar calcified lymph nodes as can be seen with prior granulomatous disease. Thyroid gland, trachea, and esophagus demonstrate no significant findings. Lungs/Pleura: Small bilateral pleural effusions, right greater than. Right basilar atelectasis. Bilateral diffuse interstitial thickening. Upper Abdomen: No acute upper abdominal abnormality. Visualized liver demonstrates a micronodular contour or as can be seen with cirrhosis. Small calcifications within the  spleen as can be seen with prior granulomatous disease. Musculoskeletal: No acute osseous abnormality. No aggressive osseous lesion. IMPRESSION: 1. Findings concerning for mild pulmonary edema. 2. Liver findings concerning for cirrhosis. Electronically Signed   By: Kathreen Devoid   On: 10/01/2017 16:35   Dg Chest Port 1 View  Result Date: 10/01/2017 CLINICAL DATA:  Shortness of breath EXAM: PORTABLE CHEST 1 VIEW COMPARISON:  09/29/2016 FINDINGS: Mild reticulonodular opacities in the lungs bilaterally, with a peripheral/subpleural and lower lobe predominance. While infection is possible, this appearance also could reflect chronic interstitial lung disease given the appearance. However, this is progressive from January 2019 and new from 2015. Small bilateral pleural effusions. Cardiomegaly. Pulmonary vascular congestion without frank interstitial edema. Postsurgical changes related to prior CABG. Median sternotomy. IMPRESSION: Mild subpleural/patchy opacities, lower lobe predominant. Multifocal infection is possible. However, in the absence of infectious symptoms, progressive chronic interstitial lung disease is favored. Cardiomegaly with pulmonary vascular congestion. No frank interstitial edema. Small bilateral pleural effusions. Further evaluation of these findings depends on the clinical scenario. If infectious symptoms are favored, follow-up chest radiographs are suggested in 3-4 weeks after appropriate antimicrobial therapy. If infectious symptoms are not favored, consider routine CT chest without contrast at this time. Regardless, if this appearance persists, outpatient high-resolution CT chest may be beneficial, but this should not be pursued in the acute emergent/inpatient clinical setting. Electronically Signed   By: Julian Hy M.D.   On: 10/01/2017 10:47        Scheduled Meds: . ferrous sulfate  325 mg Oral BID WC  . furosemide  80 mg Intravenous BID  . insulin aspart  0-9 Units  Subcutaneous TID WC  . [START ON 10/03/2017] pantoprazole  40 mg Intravenous Q12H  . polyethylene glycol  17 g Oral BID  . umeclidinium-vilanterol  1 puff Inhalation Daily   Continuous Infusions: . sodium chloride    . sodium chloride Stopped (09/30/17 1715)  . pantoprozole (PROTONIX) infusion 8 mg/hr (10/02/17 0946)     LOS: 3 days    Time spent: 35 minutes.  Elmarie Shiley, MD [facility] Pager [phone]  If 7PM-7AM, please contact night-coverage [email or web address] Password [email or web address] 10/02/2017, 1:41 PM

## 2017-10-02 NOTE — Progress Notes (Signed)
  Echocardiogram 2D Echocardiogram has been performed.  Dejanique Ruehl L Androw 10/02/2017, 2:16 PM

## 2017-10-03 DIAGNOSIS — K264 Chronic or unspecified duodenal ulcer with hemorrhage: Principal | ICD-10-CM

## 2017-10-03 DIAGNOSIS — R600 Localized edema: Secondary | ICD-10-CM

## 2017-10-03 DIAGNOSIS — I25810 Atherosclerosis of coronary artery bypass graft(s) without angina pectoris: Secondary | ICD-10-CM

## 2017-10-03 DIAGNOSIS — Z951 Presence of aortocoronary bypass graft: Secondary | ICD-10-CM

## 2017-10-03 LAB — CBC
HEMATOCRIT: 27.8 % — AB (ref 39.0–52.0)
HEMOGLOBIN: 8.3 g/dL — AB (ref 13.0–17.0)
MCH: 25.5 pg — ABNORMAL LOW (ref 26.0–34.0)
MCHC: 29.9 g/dL — ABNORMAL LOW (ref 30.0–36.0)
MCV: 85.3 fL (ref 78.0–100.0)
Platelets: 139 10*3/uL — ABNORMAL LOW (ref 150–400)
RBC: 3.26 MIL/uL — AB (ref 4.22–5.81)
RDW: 16.2 % — ABNORMAL HIGH (ref 11.5–15.5)
WBC: 5 10*3/uL (ref 4.0–10.5)

## 2017-10-03 LAB — TYPE AND SCREEN
ABO/RH(D): O POS
ANTIBODY SCREEN: NEGATIVE
UNIT DIVISION: 0
UNIT DIVISION: 0
UNIT DIVISION: 0
Unit division: 0
Unit division: 0

## 2017-10-03 LAB — BPAM RBC
BLOOD PRODUCT EXPIRATION DATE: 201902212359
BLOOD PRODUCT EXPIRATION DATE: 201902212359
BLOOD PRODUCT EXPIRATION DATE: 201902262359
Blood Product Expiration Date: 201902262359
Blood Product Expiration Date: 201902262359
ISSUE DATE / TIME: 201902020324
ISSUE DATE / TIME: 201902021359
ISSUE DATE / TIME: 201901300727
ISSUE DATE / TIME: 201902012154
ISSUE DATE / TIME: 201902020555
UNIT TYPE AND RH: 5100
UNIT TYPE AND RH: 5100
UNIT TYPE AND RH: 5100
Unit Type and Rh: 5100
Unit Type and Rh: 5100

## 2017-10-03 LAB — GLUCOSE, CAPILLARY
GLUCOSE-CAPILLARY: 104 mg/dL — AB (ref 65–99)
GLUCOSE-CAPILLARY: 140 mg/dL — AB (ref 65–99)
GLUCOSE-CAPILLARY: 149 mg/dL — AB (ref 65–99)
Glucose-Capillary: 162 mg/dL — ABNORMAL HIGH (ref 65–99)

## 2017-10-03 LAB — BASIC METABOLIC PANEL
ANION GAP: 13 (ref 5–15)
BUN: 16 mg/dL (ref 6–20)
CHLORIDE: 99 mmol/L — AB (ref 101–111)
CO2: 24 mmol/L (ref 22–32)
Calcium: 8.2 mg/dL — ABNORMAL LOW (ref 8.9–10.3)
Creatinine, Ser: 1.35 mg/dL — ABNORMAL HIGH (ref 0.61–1.24)
GFR calc non Af Amer: 51 mL/min — ABNORMAL LOW (ref >60)
GFR, EST AFRICAN AMERICAN: 59 mL/min — AB (ref >60)
Glucose, Bld: 116 mg/dL — ABNORMAL HIGH (ref 65–99)
Potassium: 3.9 mmol/L (ref 3.5–5.1)
Sodium: 136 mmol/L (ref 135–145)

## 2017-10-03 NOTE — Consult Note (Signed)
CARDIOLOGY CONSULT NOTE      Patient ID: [name] MRN: [id number] DOB/AGE: 72-May-1947 72 y.o.  Admit date: 09/29/2017 Referring PhysicianRegalado, Cassie Freer, MD Primary PhysicianCrawford, Real Cons, MD Primary Cardiologist  Nahser, Wonda Cheng, MD  Reason for Consultation:  Congestive Heart Faliure  HPI: [name] a 72 y.o.malewith medical history significant of CAD s/p CABG, DM type II, hepatic cirrhosis, upper GI bleed and OSA not on CPAP that presents with shortness of breath.  He was found to have GI bleed and transfused 4 units of PRBC.  Patient also noted to have small bilateral pleural effusions and 3+ bilateral lower extremity pitting edema.  Was consulted for congestive heart failure.       Review of systems complete and found to be negative unless listed above   Past Medical History:  Diagnosis Date  . Arthritis    "joints tighten up sometimes" (03/27/2104)  . Asthma   . CHF (congestive heart failure) ([place])   . Chronic low back pain   . Chronic lower back pain   . Coronary artery disease    a. 05/2012 Cath/PCI: LM 40ost, 50-60d, LAD 99p ruptured plaque (3.0x28 DES), LCX 50p/m, RCA 30-40p, 61m, EF 65-70%.  . Family history of adverse reaction to anesthesia    Pt granddaughter had PONV  . GERD (gastroesophageal reflux disease)   . Hepatic cirrhosis ([place])    a. Dx 01/2014 - CT a/p   . History of blood transfusion    "related to bleeding ulcers"  . History of concussion    1976--  NO RESIDUAL  . History of GI bleed    a. UGIB 07/2012;  b. 01/2014 admission with GIB/FOB stool req 1U prbc's->EGD showed portal gastropathy, barrett's esoph, and chronic active h. pylori gastritis.  Marland Kitchen History of gout    2007 &  2008  LEFT LEG-- NO ISSUE SINCE  . Hyperlipidemia   . Iron deficiency anemia   . Kidney stones   . Lipoma   . OA (osteoarthritis of spine)    LOWER BACK--  INTERMITTANT LEFT LEG NUMBNESS  . OSA (obstructive sleep apnea)    PULMOLOGIST-  DR CLANCE--   MODERATE OSA  STARTED CPAP 2012--  BUT CURRENTLY HAS NOT USED PAST 6 MONTHS  . Phimosis    a. s/p circumcision 2015.  . Type 2 diabetes mellitus ([place])   . Unspecified essential hypertension     Family History  Problem Relation Age of Onset  . Lung cancer Sister   . Cancer Sister        lung  . Cancer Mother   . Cancer Father        died in his 47s.  . Cancer Brother        lung  . Coronary artery disease Unknown   . Diabetes Unknown   . Colon cancer Unknown   . Cancer Sister        lung    Social History   Socioeconomic History  . Marital status: Married    Spouse name: Not on file  . Number of children: Not on file  . Years of education: Not on file  . Highest education level: Not on file  Social Needs  . Financial resource strain: Not on file  . Food insecurity - worry: Not on file  . Food insecurity - inability: Not on file  . Transportation needs - medical: Not on file  . Transportation needs - non-medical: Not on file  Occupational History  .  Not on file  Tobacco Use  . Smoking status: Former Smoker    Packs/day: 2.00    Years: 40.00    Pack years: 80.00    Types: Cigarettes    Last attempt to quit: 08/29/1996    Years since quitting: 21.1  . Smokeless tobacco: Never Used  Substance and Sexual Activity  . Alcohol use: Yes    Alcohol/week: 0.0 oz    Comment: occasional  . Drug use: No  . Sexual activity: Yes  Other Topics Concern  . Not on file  Social History Narrative   Lives in [place] with wife, son, and son's family.    Past Surgical History:  Procedure Laterality Date  . ANKLE FRACTURE SURGERY Right 1989   "plate put in"  . APPENDECTOMY  05-16-2004   open  . CIRCUMCISION N/A 09/09/2013   Procedure: CIRCUMCISION ADULT;  Surgeon: Bernestine Amass, MD;  Location[facility];  Service: Urology;  Laterality: N/A;  . COLECTOMY  05-16-2004  . CORONARY ANGIOPLASTY WITH STENT PLACEMENT  06/28/2012  DR COOPER   PCI W/  X1 DES to [place]. LAD/   LM  40% OSTIAL & 50-60% DISTAL /  50% PROX LCX/  30-40% PROX RCA & 50% MID RCA/   LVEF 65-70%  . CORONARY ARTERY BYPASS GRAFT N/A 03/31/2014   Procedure: CORONARY ARTERY BYPASS GRAFTING (CABG) times 3 using left internal mammary artery and right saphenous vein.;  Surgeon: Melrose Nakayama, MD;  Location: [place];  Service: Open Heart Surgery;  Laterality: N/A;  . DOBUTAMINE STRESS ECHO  06-08-2012   MODERATE HYPOKINESIS/ ISCHEMIA MID INFERIOR WALL  . ESOPHAGOGASTRODUODENOSCOPY  08/15/2012   Procedure: ESOPHAGOGASTRODUODENOSCOPY (EGD);  Surgeon: Wonda Horner, MD;  Location: [facility] ENDOSCOPY;  Service: Endoscopy;  Laterality: N/A;  . ESOPHAGOGASTRODUODENOSCOPY N/A 02/17/2014   Procedure: ESOPHAGOGASTRODUODENOSCOPY (EGD);  Surgeon: Jeryl Columbia, MD;  Location: [facility] ENDOSCOPY;  Service: Endoscopy;  Laterality: N/A;  . ESOPHAGOGASTRODUODENOSCOPY (EGD) WITH PROPOFOL N/A 09/30/2017   Procedure: ESOPHAGOGASTRODUODENOSCOPY (EGD) WITH PROPOFOL;  Surgeon: Wonda Horner, MD;  Location: [facility] ENDOSCOPY;  Service: Endoscopy;  Laterality: N/A;  . HERNIA REPAIR    . INTRAOPERATIVE TRANSESOPHAGEAL ECHOCARDIOGRAM N/A 03/31/2014   Procedure: INTRAOPERATIVE TRANSESOPHAGEAL ECHOCARDIOGRAM;  Surgeon: Melrose Nakayama, MD;  Location: [facility];  Service: Open Heart Surgery;  Laterality: N/A;  . LAPAROSCOPIC UMBILICAL HERNIA REPAIR W/ MESH  06-06-2011  . LEFT HEART CATHETERIZATION WITH CORONARY ANGIOGRAM N/A 03/28/2014   Procedure: LEFT HEART CATHETERIZATION WITH CORONARY ANGIOGRAM;  Surgeon: Sinclair Grooms, MD;  Location: [facility] CATH LAB;  Service: Cardiovascular;  Laterality: N/A;  . LIPOMA EXCISION Left 08/25/2017   Procedure: EXCISION LIPOMA LEFT POSTERIOR THIGH;  Surgeon: Clovis Riley, MD;  Location: [place];  Service: General;  Laterality: Left;  . NEPHROLITHOTOMY  1990'S  . OPEN APPENDECTOMY W/ PARTIAL CECECTOMY  05-16-2004  . PERCUTANEOUS CORONARY STENT INTERVENTION (PCI-S) N/A 06/28/2012   Procedure: PERCUTANEOUS  CORONARY STENT INTERVENTION (PCI-S);  Surgeon: Sherren Mocha, MD;  Location: [facility] C[facility] LAB;  Service: Cardiovascular;  Laterality: N/A;     Medications Prior to Admission  Medication Sig Dispense Refill Last Dose  . aspirin EC 81 MG tablet Take 1 tablet (81 mg total) by mouth 2 (two) times daily.   Past Week at Unknown time  . atorvastatin (LIPITOR) 40 MG tablet TAKE 1 TABLET BY MOUTH DAILY AT 6 PM (Patient taking differently: Take 40 mg by mouth daily at 6 PM. TAKE 1 TABLET BY MOUTH DAILY  AT 6 PM) 90 tablet 0 09/29/2017 at Unknown time  . doxycycline (VIBRA-TABS) 100 MG tablet Take 1 tablet (100 mg total) by mouth 2 (two) times daily. 14 tablet 0 09/29/2017 at Unknown time  . furosemide (LASIX) 40 MG tablet TAKE 1 TABLET BY MOUTH TWICE DAILY (Patient taking differently: TAKE 80mg   TABLET BY MOUTH TWICE DAILY) 180 tablet 0 09/29/2017 at Unknown time  . gabapentin (NEURONTIN) 100 MG capsule Take 1 capsule (100 mg total) by mouth 2 (two) times daily. 60 capsule 0 09/29/2017 at Unknown time  . losartan (COZAAR) 50 MG tablet TAKE 1 TABLET BY MOUTH EVERY DAY (Patient taking differently: TAKE 1 TABLET (50mg ) BY MOUTH EVERY DAY) 90 tablet 0 09/29/2017 at Unknown time  . metFORMIN (GLUCOPHAGE) 500 MG tablet TAKE 1 TABLET BY MOUTH TWICE DAILY WITH A MEAL (Patient taking differently: TAKE 500 mg TABLET BY MOUTH TWICE DAILY WITH A MEAL) 180 tablet 0 09/29/2017 at Unknown time  . nitroGLYCERIN (NITROSTAT) 0.4 MG SL tablet Place 1 tablet (0.4 mg total) under the tongue every 5 (five) minutes as needed for chest pain. 25 tablet 6 unk  . potassium chloride SA (K-DUR,KLOR-CON) 20 MEQ tablet Take 1 tablet (20 mEq total) by mouth daily. 90 tablet 0 09/29/2017 at Unknown time  . umeclidinium-vilanterol (ANORO ELLIPTA) 62.5-25 MCG/INH AEPB Inhale 1 puff into the lungs daily. 30 each 2 09/29/2017 at Unknown time  . predniSONE (DELTASONE) 20 MG tablet Take 2 tablets (40 mg total) by mouth daily with breakfast. (Patient not taking: Reported  on 09/30/2017) 10 tablet 0 Completed Course at Unknown time    Physical Exam: Vitals:   Vitals:   10/02/17 2132 10/03/17 0531 10/03/17 0815 10/03/17 0846  BP: (!) 144/55 (!) 132/46 139/62   Pulse: 75 65 66   Resp: 18 18 18    Temp: 98.4 F (36.9 C) 98.6 F (37 C) 98.1 F (36.7 C)   TempSrc: Oral Oral Oral   SpO2: 96% 97% 98% 96%  Weight: 123.5 kg (272 lb 4.3 oz)     Height:       I&O's:    Intake/Output Summary (Last 24 hours) at 10/03/2017 1333 Last data filed at 10/03/2017 0902 Gross per 24 hour  Intake 975 ml  Output 2475 ml  Net -1500 ml   Physical exam:  [place]/AT EOMI No JVD, No carotid bruit RRR S1S2  No wheezing, CTA bilaterally Soft. NT, nondistended 2+ pitting edema bilaterally to the knee, venous stasis changes bilaterally No focal motor or sensory deficits Normal affect  Labs:   Lab Results  Component Value Date   WBC 5.0 10/03/2017   HGB 8.3 (L) 10/03/2017   HCT 27.8 (L) 10/03/2017   MCV 85.3 10/03/2017   PLT 139 (L) 10/03/2017    Recent Labs  Lab 09/29/17 2032  10/03/17 0636  NA  --    < > 136  K  --    < > 3.9  CL  --    < > 99*  CO2  --    < > 24  BUN  --    < > 16  CREATININE  --    < > 1.35*  CALCIUM  --    < > 8.2*  PROT 6.5  --   --   BILITOT 0.8  --   --   ALKPHOS 92  --   --   ALT 21  --   --   AST 25  --   --  GLUCOSE  --    < > 116*   < > = values in this interval not displayed.   Lab Results  Component Value Date   TROPONINI <0.30 03/28/2014    Lab Results  Component Value Date   CHOL 92 (L) 06/09/2015   CHOL 87 06/26/2014   CHOL 94 07/16/2013   Lab Results  Component Value Date   HDL 28 (L) 06/09/2015   HDL 32.90 (L) 06/26/2014   HDL 36.10 (L) 07/16/2013   Lab Results  Component Value Date   LDLCALC 40 06/09/2015   LDLCALC 39 06/26/2014   LDLCALC 43 07/16/2013   Lab Results  Component Value Date   TRIG 119 06/09/2015   TRIG 75.0 06/26/2014   TRIG 75.0 07/16/2013   Lab Results  Component Value Date    CHOLHDL 3.3 06/09/2015   CHOLHDL 3 06/26/2014   CHOLHDL 3 07/16/2013   No results found for: LDLDIRECT    Radiology:  Chest x-ray 2/1 Prior CABG. Heart is mildly enlarged. Peribronchial thickening and interstitial prominence, likely bronchitic changes  Chest x-ray 2/3 Mild reticulonodular opacities in the lungs bilaterally, with a peripheral/subpleural and lower lobe predominance.  While infection is possible, this appearance also could reflect chronic interstitial lung disease given the appearance. However, this is progressive from January 2019 and new from 2015.  Small bilateral pleural effusions.  Cardiomegaly. Pulmonary vascular congestion without frank interstitial edema. Postsurgical changes related to prior CABG.  CT chest 2/3 Cardiovascular: No significant vascular findings. Normal heart size. No pericardial effusion. Prior CABG.  Mediastinum/Nodes: No enlarged mediastinal or axillary lymph nodes. Mediastinal and bilateral hilar calcified lymph nodes as can be seen with prior granulomatous disease. Thyroid gland, trachea, and esophagus demonstrate no significant findings.  Lungs/Pleura: Small bilateral pleural effusions, right greater than. Right basilar atelectasis. Bilateral diffuse interstitial thickening.  Upper Abdomen: No acute upper abdominal abnormality. Visualized liver demonstrates a micronodular contour or as can be seen with cirrhosis. Small calcifications within the spleen as can be seen with prior granulomatous disease.  Musculoskeletal: No acute osseous abnormality. No aggressive osseous lesion.   EKG: NSR  ASSESSMENT AND PLAN:  Active Problems:   Type 2 diabetes with complication (HCC)   Coronary artery disease    Hepatic cirrhosis (HCC)   S/P CABG x 3   GI bleed  Shortness of breath Patient presented on admission with shortness of breath and found to have 3+ pitting edema with clear lung sounds.  BNP elevated at 254.  Chest  x-ray on presentation showed peribronchial thickening and interstitial prominence.  Repeat chest x-ray showed cardiomegaly with pulmonary vascular congestion.  Follow up CT chest was recommended.  CT chest wo contrast reported mild pulmonary edema.  Patient is 2L up since admission.   Patient has been receiving lasix daily, currently on 80mg  BID.  Weights have been unchanged since admission.  Echo done 2/4 showed LV EF 60-65%, mod LVH, no wall motion abnormalities, valvular disease with left and right atrium moderately dilated.  Patient is satting well on room air.  He currently denies sob or orthopnea. Can continue with current diuresis monitoring bmets daily.  Leg swelling may take time to resolve to baseline.  With venous stasis changes on exam likely has venous insuffiencey and compression stockings and elevation of legs may be most helpful.  Shortness of breath was likely related to his acute anemia and not heart failure.   GI bleed Hgb of 5.6 on arrival, previously 9.2 one month ago.  FOBT  positive.  Patient has received 4 units of PRBC while inpatient.  Hgb currently 8.3 On EGD patient was found to have a duodenal ulcer and erosive gastropathy.  AKI Baseline appears to be 1.1, on admission 1.3 and currently the same.  BUN 30 on admission and now 16.  AKI  likely pre-renal.  CAD  3.0 x 28 mm drug-eluting stent (06-28-12), s/p CABG.  On aspirin and statin currently.     Cirrhosis Noted on CT chest wo contrast   Signed:  Kalman Shan Internal Medicine PGY-2  I have examined the patient and reviewed assessment and plan and discussed with patient.  Agree with above as stated.  Volume overload in the setting of significant volume resuscitation after GI bleed.  LE swelling present.  No CP or SHOB.  COntinue Lasix.  Watch renal function.    Larae Grooms

## 2017-10-03 NOTE — Care Management Important Message (Signed)
Important Message  Patient Details  Name: [name] MRN: [id number] Date of Birth: 08-26-1946   Medicare Important Message Given:  Yes    [name] 10/03/2017, 1:57 PM

## 2017-10-03 NOTE — Progress Notes (Signed)
PROGRESS NOTE    [name]  QIH:[id number] DOB: 01-01-1946 DOA: 09/29/2017 PCP: [name], MD    Brief Narrative: [name] is a 72 y.o. male with medical history significant of CAD s/p CABG, CHF, DM type II, hepatic cirrhosis, upper GI bleed followed Dr. Watt Climes, and OSA not on CPAP; who presents with complaints of intermittent shortness of breath and epigastric pain over the last 2-3 days.  History is obtained from the patient who reports being unable to read right at baseline and notes that his medications are managed by his son.  He was seen last week by his PCP and started on prednisone and possibly doxycycline for what he reports is bronchitis.  He reports improvement of his cough since that time, but reports a Deguzman-like epigastric pain that makes it feel like he cannot take catch his breath.  Associated symptoms include lightheadedness, generalized weakness, intermittent tingling, shortness of breath with exertion, leg swelling which she reports is chronic and unchanged.He reports previous history of GI bleed many years ago and states that his epigastric pain feels similar to when he was noted to have an ulcer.  Denies trying anything specifically to relieve symptoms.  Denies any chest pain, loss of consciousness, or NSAID use.  Last EGD appears to have been performed back in 01/2014 showing possible portal gastropathy and signs of Barrett's esophagus.  ED Course: Upon admission to the emergency department patient was noted to be afebrile, pulse 72-78, respirations 17-20, blood pressures maintained, and O2 saturations 100% on room air.  Labs revealed hemoglobin 5.6, platelets 147, BUN 38, creatinine 1.33, glucose 170, and troponin 0.01.  Patient was noted to have dark stools that were guaiac positive on rectal exam.  Chest x-ray showed borderline heart size with bronchiectasis changes.  Patient was typed & screened, order to be transfused 2 units of blood, given 1 g of ceftriaxone  IV,  and 40 mg of Protonix IV.  [facility] was consulted and will see the patient in a.m.  TR[facility] called to admit.    Assessment & Plan:   Active Problems:   Type 2 diabetes with complication (HCC)   Coronary artery disease    Hepatic cirrhosis ([facility])   S/P CABG x 3   GI bleed   1-Upper GI bleed, acute.  Hb at 5 on admission. He has received 4 units PRBC> during this admission.  Endoscopy showed erosive gastropathy and duodenal ulcer.  Hb decreased to 7.9, patient denies melena.  Started  ferrous sulfate.   Hb stable.   AKI; in setting of anemia, hypoperfusion.  Stable.  Hold cozaar.  Monitor on lasix.  Cr decreased to 1.3. Stable on IV lasix.   Acute on History of CHF;  He is having SOB on exertion. He has plus 2 edema LE>  Chest x ray with cardiomegaly and bilateral asymmetric subpleural, patchy opacities. CT chest consistent with pulmonary edema.  ECHO normal EF, right atrium and ventricle dilation, no mention of diastolic dysfunction.  Increase lasix to 80 mg IV BID>  Weight 271---272--272 Still with significant LE edema, dyspnea on exertion has improved.  Will ask for cardiology evaluation.   Mild subpleural/patchy opacities, lower lobe predominant. Ct chest consistent with pulmonary edema.   Hepatic cirrhosis;  Diabetes mellitus type 2: Well controlled last hemoglobin A1c noted to be 6.5 on 08/25/2017. - Hypoglycemic protocols - Hold metformin - CBGs q. before meals with sensitive SSI  CAD s/p CABG - Hold aspirin 2/2 gi bleed  Essential hypertension:  - Hold blood pressure medications  History of asthma/COPD - Continue Anoro Ellipta - DuoNeb's prn shortness of breath/wheezing  DVT prophylaxis: scd Code Status: full code.  Family Communication: Disposition Plan: home when stable.   Consultants:   GI, [facility].    Procedures:  Endoscopy; - Normal esophagus.                      - Z-line irregular, 44 cm from the incisors.                        -  Erosive gastropathy.                        - One duodenal ulcer.                         - No specimens collected.         Antimicrobials: none   Subjective: He still have significant LE edema. Dyspnea has improved. Less dyspnea on exertion   Objective: Vitals:   10/02/17 2132 10/03/17 0531 10/03/17 0815 10/03/17 0846  BP: (!) 144/55 (!) 132/46 139/62   Pulse: 75 65 66   Resp: 18 18 18    Temp: 98.4 F (36.9 C) 98.6 F (37 C) 98.1 F (36.7 C)   TempSrc: Oral Oral Oral   SpO2: 96% 97% 98% 96%  Weight: 123.5 kg (272 lb 4.3 oz)     Height:        Intake/Output Summary (Last 24 hours) at 10/03/2017 1602 Last data filed at 10/03/2017 1422 Gross per 24 hour  Intake 692.5 ml  Output 2350 ml  Net -1657.5 ml   Filed Weights   09/30/17 2125 10/01/17 2034 10/02/17 2132  Weight: 123.2 kg (271 lb 9.7 oz) 123.4 kg (272 lb 0.8 oz) 123.5 kg (272 lb 4.3 oz)    Examination:  General exam: NAD Respiratory system: crackles bases.  Cardiovascular system: S 1, S 2 RRR, plus 2 edema  Gastrointestinal system: BS present, soft, nt Central nervous system: non focal.  Extremities: symmetric power.  Skin: no rash    Data Reviewed: I have personally reviewed following labs and imaging studies  CBC: Recent Labs  Lab 09/29/17 1819 09/30/17 1052 09/30/17 1145 10/01/17 0928 10/02/17 0709 10/03/17 0636  WBC 5.3  --  5.1 5.5 4.6 5.0  HGB 5.6* 8.2* 7.5* 8.1* 7.9* 8.3*  HCT 18.9* 24.0* 24.2* 26.2* 25.7* 27.8*  MCV 85.1  --  85.8 85.6 85.7 85.3  PLT 147*  --  125* 131* 125* 154*   Basic Metabolic Panel: Recent Labs  Lab 09/29/17 1819 09/30/17 1052 09/30/17 1145 10/01/17 0928 10/02/17 0709 10/03/17 0636  NA 137 140 137 135 136 136  K 4.3 4.4 4.5 4.4 3.8 3.9  CL 102 102 104 103 102 99*  CO2 23  --  23 21* 23 24  GLUCOSE 170* 135* 146* 205* 115* 116*  BUN 30* 24* 24* 22* 18 16  CREATININE 1.33* 1.20 1.35* 1.46* 1.31* 1.35*  CALCIUM 8.4*  --  8.1* 8.2* 8.0* 8.2*    GFR: Estimated Creatinine Clearance: 60.3 mL/min (A) (by C-G formula based on SCr of 1.35 mg/dL (H)). Liver Function Tests: Recent Labs  Lab 09/29/17 2032  AST 25  ALT 21  ALKPHOS 92  BILITOT 0.8  PROT 6.5  ALBUMIN 3.3*   No results for input(s): LIPASE, AMYLASE in  the last 168 hours. No results for input(s): AMMONIA in the last 168 hours. Coagulation Profile: No results for input(s): INR, PROTIME in the last 168 hours. Cardiac Enzymes: No results for input(s): CKTOTAL, CKMB, CKMBINDEX, TROPONINI in the last 168 hours. BNP (last 3 results) No results for input(s): PROBNP in the last 8760 hours. HbA1C: No results for input(s): HGBA1C in the last 72 hours. CBG: Recent Labs  Lab 10/02/17 1157 10/02/17 1644 10/02/17 2131 10/03/17 0734 10/03/17 1155  GLUCAP 142* 141* 163* 104* 140*   Lipid Profile: No results for input(s): CHOL, HDL, LDLCALC, TRIG, CHOLHDL, LDLDIRECT in the last 72 hours. Thyroid Function Tests: No results for input(s): TSH, T4TOTAL, FREET4, T3FREE, THYROIDAB in the last 72 hours. Anemia Panel: No results for input(s): VITAMINB12, FOLATE, FERRITIN, TIBC, IRON, RETICCTPCT in the last 72 hours. Sepsis Labs: No results for input(s): PROCALCITON, LATICACIDVEN in the last 168 hours.  No results found for this or any previous visit (from the past 240 hour(s)).       Radiology Studies: No results found.      Scheduled Meds: . ferrous sulfate  325 mg Oral BID WC  . furosemide  80 mg Intravenous BID  . insulin aspart  0-9 Units Subcutaneous TID WC  . pantoprazole  40 mg Intravenous Q12H  . polyethylene glycol  17 g Oral BID  . umeclidinium-vilanterol  1 puff Inhalation Daily   Continuous Infusions: . sodium chloride    . sodium chloride Stopped (09/30/17 1715)     LOS: 4 days    Time spent: 35 minutes.     Elmarie Shiley, MD [facility] Pager [phone]  If 7PM-7AM, please contact night-coverage [email or web address] Password  [email or web address] 10/03/2017, 4:02 PM

## 2017-10-03 NOTE — Care Management Note (Signed)
Case Management Note  Patient Details  Name: SUEO CULLEN MRN: [id number] Date of Birth: March 18, 1946  Subjective/Objective:                Spoke w patient and wife and son at bedside. Patient is independent. Son lives with parents and provides help, transportation, and supervision. They deny barriers to getting medications. Patient does not currently use DME at home, up walking in room. Continues IV Lasix for diuresis. No CM needs identified at this time.    Action/Plan:   Expected Discharge Date:  10/11/17               Expected Discharge Plan:  Home/Self Care  In-House Referral:     Discharge planning Services  CM Consult  Post Acute Care Choice:    Choice offered to:     DME Arranged:    DME Agency:     HH Arranged:    HH Agency:     Status of Service:  Completed, signed off  If discussed at [employer] of Stay Meetings, dates discussed:    Additional Comments:  Carles Collet, RN 10/03/2017, 10:50 AM

## 2017-10-04 LAB — GLUCOSE, CAPILLARY
GLUCOSE-CAPILLARY: 102 mg/dL — AB (ref 65–99)
GLUCOSE-CAPILLARY: 148 mg/dL — AB (ref 65–99)
GLUCOSE-CAPILLARY: 143 mg/dL — AB (ref 65–99)
Glucose-Capillary: 115 mg/dL — ABNORMAL HIGH (ref 65–99)

## 2017-10-04 LAB — BASIC METABOLIC PANEL
Anion gap: 14 (ref 5–15)
BUN: 16 mg/dL (ref 6–20)
CHLORIDE: 99 mmol/L — AB (ref 101–111)
CO2: 23 mmol/L (ref 22–32)
Calcium: 8.3 mg/dL — ABNORMAL LOW (ref 8.9–10.3)
Creatinine, Ser: 1.3 mg/dL — ABNORMAL HIGH (ref 0.61–1.24)
GFR calc Af Amer: >60 mL/min (ref >60)
GFR calc non Af Amer: 54 mL/min — ABNORMAL LOW (ref >60)
Glucose, Bld: 110 mg/dL — ABNORMAL HIGH (ref 65–99)
POTASSIUM: 3.8 mmol/L (ref 3.5–5.1)
SODIUM: 136 mmol/L (ref 135–145)

## 2017-10-04 LAB — CBC
HEMATOCRIT: 27.9 % — AB (ref 39.0–52.0)
HEMOGLOBIN: 8.5 g/dL — AB (ref 13.0–17.0)
MCH: 26.2 pg (ref 26.0–34.0)
MCHC: 30.5 g/dL (ref 30.0–36.0)
MCV: 85.8 fL (ref 78.0–100.0)
Platelets: 140 10*3/uL — ABNORMAL LOW (ref 150–400)
RBC: 3.25 MIL/uL — AB (ref 4.22–5.81)
RDW: 16.4 % — ABNORMAL HIGH (ref 11.5–15.5)
WBC: 4.3 10*3/uL (ref 4.0–10.5)

## 2017-10-04 MED ORDER — SPIRONOLACTONE 25 MG PO TABS
25.0000 mg | ORAL_TABLET | Freq: Every day | ORAL | Status: DC
  Administered 2017-10-04 – 2017-10-05 (×2): 25 mg via ORAL
  Filled 2017-10-04 (×2): qty 1

## 2017-10-04 NOTE — Progress Notes (Signed)
PROGRESS NOTE    [name]  SAY:[id number] DOB: Jan 16, 1946 DOA: 09/29/2017 PCP: [name], MD    Brief Narrative: [name] is a 72 y.o. male with medical history significant of CAD s/p CABG, CHF, DM type II, hepatic cirrhosis, upper GI bleed followed Dr. Watt Climes, and OSA not on CPAP; who presents with complaints of intermittent shortness of breath and epigastric pain over the last 2-3 days.  History is obtained from the patient who reports being unable to read right at baseline and notes that his medications are managed by his son.  He was seen last week by his PCP and started on prednisone and possibly doxycycline for what he reports is bronchitis.  He reports improvement of his cough since that time, but reports a Goth-like epigastric pain that makes it feel like he cannot take catch his breath.  Associated symptoms include lightheadedness, generalized weakness, intermittent tingling, shortness of breath with exertion, leg swelling which she reports is chronic and unchanged.He reports previous history of GI bleed many years ago and states that his epigastric pain feels similar to when he was noted to have an ulcer.  Denies trying anything specifically to relieve symptoms.  Denies any chest pain, loss of consciousness, or NSAID use.  Last EGD appears to have been performed back in 01/2014 showing possible portal gastropathy and signs of Barrett's esophagus.  ED Course: Upon admission to the emergency department patient was noted to be afebrile, pulse 72-78, respirations 17-20, blood pressures maintained, and O2 saturations 100% on room air.  Labs revealed hemoglobin 5.6, platelets 147, BUN 38, creatinine 1.33, glucose 170, and troponin 0.01.  Patient was noted to have dark stools that were guaiac positive on rectal exam.  Chest x-ray showed borderline heart size with bronchiectasis changes.  Patient was typed & screened, order to be transfused 2 units of blood, given 1 g of ceftriaxone  IV,  and 40 mg of Protonix IV.  [facility] was consulted and will see the patient in a.m.  [facility] called to admit.  Admitted with GI bleed Hb at 5. Subsequently develops worsening LE edema and SOB. CT showed pulmonary edem    Assessment & Plan:   Active Problems:   Type 2 diabetes with complication (HCC)   Coronary artery disease    Hepatic cirrhosis (HCC)   S/P CABG x 3   GI bleed   1-Upper GI bleed, acute.  Hb at 5 on admission. He has received 4 units PRBC> during this admission.  Endoscopy showed erosive gastropathy and duodenal ulcer.  Hb decreased to 7.9, patient denies melena.  Started  ferrous sulfate.   Hb stable.   AKI; in setting of anemia, hypoperfusion.  Stable. Hold cozaar.  Monitor on lasix.  Cr decreased to 1.3. Stable on IV lasix.   Pulmonary Edema; LE edema He is having SOB on exertion. He has plus 2 edema LE>  Chest x ray with cardiomegaly and bilateral asymmetric subpleural, patchy opacities. CT chest consistent with pulmonary edema.  ECHO normal EF, right atrium and ventricle dilation, no mention of diastolic dysfunction.  lasix to 80 mg IV BID>  Weight 271---272--272 Still with significant LE edema, dyspnea on exertion has improved.  cardiology evaluation. Cardiology think patient has pulmonary edema form transfusion, no HF Will add spironolactone with history of cirrhosis.   Mild subpleural/patchy opacities, lower lobe predominant. Ct chest consistent with pulmonary edema.  Needs repeat chest x ray, son aware.   Hepatic cirrhosis; needs to follow  up with GI   Diabetes mellitus type 2: Well controlled last hemoglobin A1c noted to be 6.5 on 08/25/2017. - Hypoglycemic protocols - Hold metformin - CBGs q. before meals with sensitive SSI  CAD s/p CABG - Hold aspirin 2/2 gi bleed   Essential hypertension:  - Hold blood pressure medications  History of asthma/COPD - Continue Anoro Ellipta - DuoNeb's prn shortness of breath/wheezing  DVT  prophylaxis: scd Code Status: full code.  Family Communication: Disposition Plan: home in 24 hours    Consultants:   [facility], [facility].    Procedures:  Endoscopy; - Normal esophagus.                      - Z-line irregular, 44 cm from the incisors.                        - Erosive gastropathy.                        - One duodenal ulcer.                         - No specimens collected.         Antimicrobials: none   Subjective: LE edema persist, breathing better   Objective: Vitals:   10/03/17 1647 10/03/17 2019 10/04/17 0534 10/04/17 0857  BP: 130/62 (!) 141/60 (!) 106/38   Pulse: 67 64 66   Resp: 18 18 18    Temp: 98.5 F (36.9 C) 98.1 F (36.7 C) 97.9 F (36.6 C)   TempSrc: Oral Oral Oral   SpO2: 99% 98% 96% 97%  Weight:  123.5 kg (272 lb 5 oz)    Height:        Intake/Output Summary (Last 24 hours) at 10/04/2017 1659 Last data filed at 10/04/2017 1100 Gross per 24 hour  Intake 840 ml  Output 3226 ml  Net -2386 ml   Filed Weights   10/01/17 2034 10/02/17 2132 10/03/17 2019  Weight: 123.4 kg (272 lb 0.8 oz) 123.5 kg (272 lb 4.3 oz) 123.5 kg (272 lb 5 oz)    Examination:  General exam: NAD Respiratory system: CTA Cardiovascular system: S 1, S 2 RRR Gastrointestinal system: BS present, soft, nt Central nervous system: non focal.  Extremities: plus 2 edema.  Skin: no rash    Data Reviewed: I have personally reviewed following labs and imaging studies  CBC: Recent Labs  Lab 09/30/17 1145 10/01/17 0928 10/02/17 0709 10/03/17 0636 10/04/17 0548  WBC 5.1 5.5 4.6 5.0 4.3  HGB 7.5* 8.1* 7.9* 8.3* 8.5*  HCT 24.2* 26.2* 25.7* 27.8* 27.9*  MCV 85.8 85.6 85.7 85.3 85.8  PLT 125* 131* 125* 139* 672*   Basic Metabolic Panel: Recent Labs  Lab 09/30/17 1145 10/01/17 0928 10/02/17 0709 10/03/17 0636 10/04/17 0548  NA 137 135 136 136 136  K 4.5 4.4 3.8 3.9 3.8  CL 104 103 102 99* 99*  CO2 23 21* 23 24 23   GLUCOSE 146* 205* 115* 116* 110*  BUN 24*  22* 18 16 16   CREATININE 1.35* 1.46* 1.31* 1.35* 1.30*  CALCIUM 8.1* 8.2* 8.0* 8.2* 8.3*   GFR: Estimated Creatinine Clearance: 62.6 mL/min (A) (by C-G formula based on SCr of 1.3 mg/dL (H)). Liver Function Tests: Recent Labs  Lab 09/29/17 2032  AST 25  ALT 21  ALKPHOS 92  BILITOT 0.8  PROT 6.5  ALBUMIN 3.3*  No results for input(s): LIPASE, AMYLASE in the last 168 hours. No results for input(s): AMMONIA in the last 168 hours. Coagulation Profile: No results for input(s): INR, PROTIME in the last 168 hours. Cardiac Enzymes: No results for input(s): CKTOTAL, CKMB, CKMBINDEX, TROPONINI in the last 168 hours. BNP (last 3 results) No results for input(s): PROBNP in the last 8760 hours. HbA1C: No results for input(s): HGBA1C in the last 72 hours. CBG: Recent Labs  Lab 10/03/17 1646 10/03/17 2017 10/04/17 0741 10/04/17 1208 10/04/17 1658  GLUCAP 149* 162* 102* 148* 115*   Lipid Profile: No results for input(s): CHOL, HDL, LDLCALC, TRIG, CHOLHDL, LDLDIRECT in the last 72 hours. Thyroid Function Tests: No results for input(s): TSH, T4TOTAL, FREET4, T3FREE, THYROIDAB in the last 72 hours. Anemia Panel: No results for input(s): VITAMINB12, FOLATE, FERRITIN, TIBC, IRON, RETICCTPCT in the last 72 hours. Sepsis Labs: No results for input(s): PROCALCITON, LATICACIDVEN in the last 168 hours.  No results found for this or any previous visit (from the past 240 hour(s)).       Radiology Studies: No results found.      Scheduled Meds: . ferrous sulfate  325 mg Oral BID WC  . furosemide  80 mg Intravenous BID  . insulin aspart  0-9 Units Subcutaneous TID WC  . pantoprazole  40 mg Intravenous Q12H  . polyethylene glycol  17 g Oral BID  . spironolactone  25 mg Oral Daily  . umeclidinium-vilanterol  1 puff Inhalation Daily   Continuous Infusions: . sodium chloride    . sodium chloride Stopped (09/30/17 1715)     LOS: 5 days    Time spent: 35 minutes.      Elmarie Shiley, MD [facility] Pager [phone]  If 7PM-7AM, please contact night-coverage [email or web address] Password [email or web address] 10/04/2017, 4:59 PM

## 2017-10-04 NOTE — Progress Notes (Signed)
Renal function stable. Diuresed 2.4L yesterday. Weight stable. Adjust diuretics as needed. Will sign off. Call with questions.

## 2017-10-05 DIAGNOSIS — E118 Type 2 diabetes mellitus with unspecified complications: Secondary | ICD-10-CM

## 2017-10-05 DIAGNOSIS — I110 Hypertensive heart disease with heart failure: Secondary | ICD-10-CM

## 2017-10-05 DIAGNOSIS — K922 Gastrointestinal hemorrhage, unspecified: Secondary | ICD-10-CM

## 2017-10-05 DIAGNOSIS — D649 Anemia, unspecified: Secondary | ICD-10-CM

## 2017-10-05 LAB — BASIC METABOLIC PANEL
ANION GAP: 12 (ref 5–15)
BUN: 15 mg/dL (ref 6–20)
CHLORIDE: 98 mmol/L — AB (ref 101–111)
CO2: 26 mmol/L (ref 22–32)
CREATININE: 1.28 mg/dL — AB (ref 0.61–1.24)
Calcium: 8.5 mg/dL — ABNORMAL LOW (ref 8.9–10.3)
GFR calc Af Amer: >60 mL/min (ref >60)
GFR calc non Af Amer: 55 mL/min — ABNORMAL LOW (ref >60)
Glucose, Bld: 115 mg/dL — ABNORMAL HIGH (ref 65–99)
Potassium: 3.9 mmol/L (ref 3.5–5.1)
Sodium: 136 mmol/L (ref 135–145)

## 2017-10-05 LAB — GLUCOSE, CAPILLARY
GLUCOSE-CAPILLARY: 137 mg/dL — AB (ref 65–99)
Glucose-Capillary: 104 mg/dL — ABNORMAL HIGH (ref 65–99)

## 2017-10-05 LAB — CBC
HEMATOCRIT: 30 % — AB (ref 39.0–52.0)
Hemoglobin: 9.1 g/dL — ABNORMAL LOW (ref 13.0–17.0)
MCH: 26.1 pg (ref 26.0–34.0)
MCHC: 30.3 g/dL (ref 30.0–36.0)
MCV: 86 fL (ref 78.0–100.0)
Platelets: 142 10*3/uL — ABNORMAL LOW (ref 150–400)
RBC: 3.49 MIL/uL — ABNORMAL LOW (ref 4.22–5.81)
RDW: 16.8 % — AB (ref 11.5–15.5)
WBC: 4.6 10*3/uL (ref 4.0–10.5)

## 2017-10-05 MED ORDER — FERROUS SULFATE 325 (65 FE) MG PO TABS: 325.0000 mg | ORAL_TABLET | Freq: Two times a day (BID) | ORAL | 3 refills | Status: DC

## 2017-10-05 MED ORDER — METFORMIN HCL 850 MG PO TABS: 850.0000 mg | ORAL_TABLET | Freq: Two times a day (BID) | ORAL | 3 refills | Status: DC

## 2017-10-05 MED ORDER — METFORMIN HCL 500 MG PO TABS: 500.0000 mg | ORAL_TABLET | Freq: Two times a day (BID) | ORAL | 1 refills | Status: DC

## 2017-10-05 MED ORDER — PANTOPRAZOLE SODIUM 40 MG PO TBEC: 40.0000 mg | DELAYED_RELEASE_TABLET | Freq: Two times a day (BID) | ORAL | Status: DC

## 2017-10-05 MED ORDER — SPIRONOLACTONE 25 MG PO TABS: 12.5000 mg | ORAL_TABLET | Freq: Every day | ORAL | 1 refills | Status: DC

## 2017-10-05 MED ORDER — METOPROLOL TARTRATE 25 MG PO TABS: 12.5000 mg | ORAL_TABLET | Freq: Two times a day (BID) | ORAL | 1 refills | Status: DC

## 2017-10-05 MED ORDER — POLYETHYLENE GLYCOL 3350 17 G PO PACK: 17.0000 g | PACK | Freq: Two times a day (BID) | ORAL | 0 refills | Status: DC

## 2017-10-05 MED ORDER — LOSARTAN POTASSIUM 50 MG PO TABS: ORAL_TABLET | ORAL | 0 refills | Status: DC

## 2017-10-05 MED ORDER — FUROSEMIDE 80 MG PO TABS: 80.0000 mg | ORAL_TABLET | Freq: Two times a day (BID) | ORAL | 1 refills | Status: DC

## 2017-10-05 MED ORDER — PANTOPRAZOLE SODIUM 40 MG PO TBEC: 40.0000 mg | DELAYED_RELEASE_TABLET | Freq: Two times a day (BID) | ORAL | 2 refills | Status: DC

## 2017-10-05 MED ORDER — ASPIRIN EC 81 MG PO TBEC: 81.0000 mg | DELAYED_RELEASE_TABLET | Freq: Every day | ORAL | Status: DC

## 2017-10-05 MED ORDER — ONDANSETRON HCL 4 MG PO TABS: 4.0000 mg | ORAL_TABLET | Freq: Four times a day (QID) | ORAL | 0 refills | Status: DC | PRN

## 2017-10-05 NOTE — Discharge Instructions (Signed)
°  1)Patient With Anemia and Congestive Heart Failure- Patient needs in Home Cardiopulmonary assessment, medication review, dietary compliance advised and leg wraps or  Unna boots to lower extremities due to significant lower extremity edema  2)Call or return if dark stools or bright red blood per rectum  3)Avoid ibuprofen/Advil/Aleve/Motrin/Goody Powders/Naproxen/BC powders as these will make you more likely to bleed and can cause stomach ulcers as well as kidney problems  4)repeat chest x ray as outpatient in 2-3 months advised, patient and son aware.   5)okay to resume aspirin 81 mg daily with food on 10/18/17

## 2017-10-05 NOTE — Discharge Summary (Signed)
[name], is a 72 y.o. male  DOB December 06, 1945  MRN [id number].  Admission date:  09/29/2017  Admitting Physician  Norval Morton, MD  Discharge Date:  10/05/2017   Primary MD  [name], MD  Recommendations for primary care physician for things to follow:  1)Patient With Anemia and Congestive Heart Failure- Patient needs in Home Cardiopulmonary assessment, medication review, dietary compliance advised and leg wraps or  Unna boots to lower extremities due to significant lower extremity edema  2)Call or return if dark stools or bright red blood per rectum  3)Avoid ibuprofen/Advil/Aleve/Motrin/Goody Powders/Naproxen/BC powders as these will make you more likely to bleed and can cause stomach ulcers as well as kidney problems  4)repeat chest x ray as outpatient in 2-3 months advised, patient and son aware.   Admission Diagnosis  Upper GI bleed [K92.2] Anemia, unspecified type [D64.9]   Discharge Diagnosis  Upper GI bleed [K92.2] Anemia, unspecified type [D64.9]    Active Problems:   Type 2 diabetes with complication ([place])   Coronary artery disease    Hepatic cirrhosis (HCC)   S/P CABG x 3   GI bleed      Past Medical History:  Diagnosis Date  . Arthritis    "joints tighten up sometimes" (03/27/2104)  . Asthma   . CHF (congestive heart failure) ([place])   . Chronic low back pain   . Chronic lower back pain   . Coronary artery disease    a. 05/2012 Cath/PCI: LM 40ost, 50-60d, LAD 99p ruptured plaque (3.0x28 DES), LCX 50p/m, RCA 30-40p, 46m, EF 65-70%.  . Family history of adverse reaction to anesthesia    Pt granddaughter had PONV  . GERD (gastroesophageal reflux disease)   . Hepatic cirrhosis ([place])    a. Dx 01/2014 - CT a/p   . History of blood transfusion    "related to bleeding ulcers"  . History of concussion    1976--  NO RESIDUAL  . History of GI bleed    a. UGIB 07/2012;   b. 01/2014 admission with GIB/FOB stool req 1U prbc's->EGD showed portal gastropathy, barrett's esoph, and chronic active h. pylori gastritis.  Marland Kitchen History of gout    2007 &  2008  LEFT LEG-- NO ISSUE SINCE  . Hyperlipidemia   . Iron deficiency anemia   . Kidney stones   . Lipoma   . OA (osteoarthritis of spine)    LOWER BACK--  INTERMITTANT LEFT LEG NUMBNESS  . OSA (obstructive sleep apnea)    PULMOLOGIST-  DR CLANCE--  MODERATE OSA  STARTED CPAP 2012--  BUT CURRENTLY HAS NOT USED PAST 6 MONTHS  . Phimosis    a. s/p circumcision 2015.  . Type 2 diabetes mellitus ([place])   . Unspecified essential hypertension     Past Surgical History:  Procedure Laterality Date  . ANKLE FRACTURE SURGERY Right 1989   "plate put in"  . APPENDECTOMY  05-16-2004   open  . CIRCUMCISION N/A 09/09/2013   Procedure: CIRCUMCISION ADULT;  Surgeon:  Bernestine Amass, MD;  [facility];  Service: Urology;  Laterality: N/A;  . COLECTOMY  05-16-2004  . CORONARY ANGIOPLASTY WITH STENT PLACEMENT  06/28/2012  DR COOPER   PCI W/  X1 DES to [place]. LAD/  LM  40% OSTIAL & 50-60% DISTAL /  50% PROX LCX/  30-40% PROX RCA & 50% MID RCA/   LVEF 65-70%  . CORONARY ARTERY BYPASS GRAFT N/A 03/31/2014   Procedure: CORONARY ARTERY BYPASS GRAFTING (CABG) times 3 using left internal mammary artery and right saphenous vein.;  Surgeon: Melrose Nakayama, MD;  Location: [place];  Service: Open Heart Surgery;  Laterality: N/A;  . DOBUTAMINE STRESS ECHO  06-08-2012   MODERATE HYPOKINESIS/ ISCHEMIA MID INFERIOR WALL  . ESOPHAGOGASTRODUODENOSCOPY  08/15/2012   Procedure: ESOPHAGOGASTRODUODENOSCOPY (EGD);  Surgeon: Wonda Horner, MD;  Location: [facility] ENDOSCOPY;  Service: Endoscopy;  Laterality: N/A;  . ESOPHAGOGASTRODUODENOSCOPY N/A 02/17/2014   Procedure: ESOPHAGOGASTRODUODENOSCOPY (EGD);  Surgeon: Jeryl Columbia, MD;  Location[facility] ENDOSCOPY;  Service: Endoscopy;  Laterality: N/A;  . ESOPHAGOGASTRODUODENOSCOPY (EGD) WITH  PROPOFOL N/A 09/30/2017   Procedure: ESOPHAGOGASTRODUODENOSCOPY (EGD) WITH PROPOFOL;  Surgeon: Wonda Horner, MD;  Location: [facility] ENDOSCOPY;  Service: Endoscopy;  Laterality: N/A;  . HERNIA REPAIR    . INTRAOPERATIVE TRANSESOPHAGEAL ECHOCARDIOGRAM N/A 03/31/2014   Procedure: INTRAOPERATIVE TRANSESOPHAGEAL ECHOCARDIOGRAM;  Surgeon: Melrose Nakayama, MD;  Location: [place];  Service: Open Heart Surgery;  Laterality: N/A;  . LAPAROSCOPIC UMBILICAL HERNIA REPAIR W/ MESH  06-06-2011  . LEFT HEART CATHETERIZATION WITH CORONARY ANGIOGRAM N/A 03/28/2014   Procedure: LEFT HEART CATHETERIZATION WITH CORONARY ANGIOGRAM;  Surgeon: Sinclair Grooms, MD;  Location: [facility] CATH LAB;  Service: Cardiovascular;  Laterality: N/A;  . LIPOMA EXCISION Left 08/25/2017   Procedure: EXCISION LIPOMA LEFT POSTERIOR THIGH;  Surgeon: Clovis Riley, MD;  Location: [place];  Service: General;  Laterality: Left;  . NEPHROLITHOTOMY  1990'S  . OPEN APPENDECTOMY W/ PARTIAL CECECTOMY  05-16-2004  . PERCUTANEOUS CORONARY STENT INTERVENTION (PCI-S) N/A 06/28/2012   Procedure: PERCUTANEOUS CORONARY STENT INTERVENTION (PCI-S);  Surgeon: Sherren Mocha, MD;  Location: [facility] CATH LAB;  Service: Cardiovascular;  Laterality: N/A;       HPI  from the history and physical done on the day of admission:    HPI: [name] is a 72 y.o. male with medical history significant of CAD s/p CABG, CHF, DM type II, hepatic cirrhosis, upper GI bleed followed Dr. Watt Climes, and OSA not on CPAP; who presents with complaints of intermittent shortness of breath and epigastric pain over the last 2-3 days.  History is obtained from the patient who reports being unable to read right at baseline and notes that his medications are managed by his son.  He was seen last week by his PCP and started on prednisone and possibly doxycycline for what he reports is bronchitis.  He reports improvement of his cough since that time, but reports a Mcgough-like epigastric pain that makes  it feel like he cannot take catch his breath.  Associated symptoms include lightheadedness, generalized weakness, intermittent tingling, shortness of breath with exertion, leg swelling which she reports is chronic and unchanged.He reports previous history of GI bleed many years ago and states that his epigastric pain feels similar to when he was noted to have an ulcer.  Denies trying anything specifically to relieve symptoms.  Denies any chest pain, loss of consciousness, or NSAID use.  Last EGD appears to have been performed back in  01/2014 showing possible portal gastropathy and signs of Barrett's esophagus.  ED Course: Upon admission to the emergency department patient was noted to be afebrile, pulse 72-78, respirations 17-20, blood pressures maintained, and O2 saturations 100% on room air.  Labs revealed hemoglobin 5.6, platelets 147, BUN 38, creatinine 1.33, glucose 170, and troponin 0.01.  Patient was noted to have dark stools that were guaiac positive on rectal exam.  Chest x-ray showed borderline heart size with bronchiectasis changes.  Patient was typed & screened, order to be transfused 2 units of blood, given 1 g of ceftriaxone IV,  and 40 mg of Protonix IV.  [facility] was consulted and will see the patient in a.m.  [facility] called to admit.     Hospital Course:    1)-Upper GI bleed, acute/Acute Blood Loss Anemia secondary to upper GI bleed- Hb at 5 on admission. He has received 4 units PRBC> during this admission, Endoscopy showed erosive gastropathy and duodenal ulcer. hemoglobin on 10/05/2017 is 9.1, continue iron supplementation continue Protonix.  Repeat CBC with PCP within a week   2)AKI; in setting of anemia, hypoperfusion-resolved AKI,  creatinine 1.2, resume Cozaar and Lasix  3)HFpEF/Pulmonary Edema; LE edema-much improved, post ambulation O2 sats on 10/05/2017 is 97% on room air, EF 60-65%, continue Lasix, Aldactone, patient will need ACE wraps or unna Booth  4)Mild Subpleural/Patchy  Opacities, lower lobe predominant. Ct chest consistent with pulmonary edema, repeat chest x ray as outpatient in 2-3 months advised, patient and son aware.   5)Hepatic Cirrhosis-   outpatient GI follow-up advised  6)Diabetes mellitus type- : Well controlled last hemoglobin A1c noted to be 6.5 on 08/25/2017, continue metformin 500 mg twice daily  7)CAD s/p CABG- okay to resume aspirin 81 mg daily with food on 10/18/17 further GI bleeding, give Lipitor and metoprolol as ordered  8)History of asthma/COPD- - Continue Anoro Ellipta  Discharge Condition: stable  Follow UP  Follow-up Information    [name], MD Follow up in 1 week(s).   Specialty:  Internal Medicine Contact information: [address] [place] [address] [phone]            Consults obtained - Gi/cardiology  Diet and Activity recommendation:  As advised  Discharge Instructions    Discharge Instructions    (HEART FAILURE PATIENTS) Call MD:  Anytime you have any of the following symptoms: 1) 3 pound weight gain in 24 hours or 5 pounds in 1 week 2) shortness of breath, with or without a dry hacking cough 3) swelling in the hands, feet or stomach 4) if you have to sleep on extra pillows at night in order to breathe.   Complete by:  As directed    Call MD for:   Complete by:  As directed    Call MD for:  difficulty breathing, headache or visual disturbances   Complete by:  As directed    Call MD for:  persistant dizziness or light-headedness   Complete by:  As directed    Call MD for:  persistant nausea and vomiting   Complete by:  As directed    Call MD for:  temperature >100.4   Complete by:  As directed    Diet - low sodium heart healthy   Complete by:  As directed    Discharge instructions   Complete by:  As directed    1)Patient With Anemia and Congestive Heart Failure- Patient needs in Home Cardiopulmonary assessment, medication review, dietary compliance advised and leg wraps  or   Unna boots to lower extremities due to significant lower extremity edema  2)Call or return if dark stools or bright red blood per rectum  3)Avoid ibuprofen/Advil/Aleve/Motrin/Goody Powders/Naproxen/BC powders as these will make you more likely to bleed and can cause stomach ulcers as well as kidney problems  4)repeat chest x ray as outpatient in 2-3 months advised, patient and son aware.  5)okay to resume aspirin 81 mg daily with food on 10/18/17   Increase activity slowly   Complete by:  As directed         Discharge Medications     Allergies as of 10/05/2017   No Known Allergies     Medication List    STOP taking these medications   doxycycline 100 MG tablet Commonly known as:  VIBRA-TABS   predniSONE 20 MG tablet Commonly known as:  DELTASONE     TAKE these medications   aspirin EC 81 MG tablet Take 1 tablet (81 mg total) by mouth daily. Restart With Breakfast/Food on 10/18/17 What changed:    when to take this  additional instructions   atorvastatin 40 MG tablet Commonly known as:  LIPITOR TAKE 1 TABLET BY MOUTH DAILY AT 6 PM What changed:    how much to take  how to take this  when to take this  additional instructions   ferrous sulfate 325 (65 FE) MG tablet Take 1 tablet (325 mg total) by mouth 2 (two) times daily with a meal.   furosemide 80 MG tablet Commonly known as:  LASIX Take 1 tablet (80 mg total) by mouth 2 (two) times daily. What changed:    medication strength  how much to take  when to take this   gabapentin 100 MG capsule Commonly known as:  NEURONTIN Take 1 capsule (100 mg total) by mouth 2 (two) times daily.   losartan 50 MG tablet Commonly known as:  COZAAR TAKE 1 TABLET (50mg ) BY MOUTH EVERY DAY What changed:    how much to take  how to take this  when to take this  additional instructions   metFORMIN 500 MG tablet Commonly known as:  GLUCOPHAGE Take 1 tablet (500 mg total) by mouth 2 (two) times daily with a  meal. What changed:  See the new instructions.   metoprolol tartrate 25 MG tablet Commonly known as:  LOPRESSOR Take 0.5 tablets (12.5 mg total) by mouth 2 (two) times daily. For Heart   nitroGLYCERIN 0.4 MG SL tablet Commonly known as:  NITROSTAT Place 1 tablet (0.4 mg total) under the tongue every 5 (five) minutes as needed for chest pain.   ondansetron 4 MG tablet Commonly known as:  ZOFRAN Take 1 tablet (4 mg total) by mouth every 6 (six) hours as needed for nausea.   pantoprazole 40 MG tablet Commonly known as:  PROTONIX Take 1 tablet (40 mg total) by mouth 2 (two) times daily.   polyethylene glycol packet Commonly known as:  MIRALAX / GLYCOLAX Take 17 g by mouth 2 (two) times daily.   potassium chloride SA 20 MEQ tablet Commonly known as:  K-DUR,KLOR-CON Take 1 tablet (20 mEq total) by mouth daily.   spironolactone 25 MG tablet Commonly known as:  ALDACTONE Take 0.5 tablets (12.5 mg total) by mouth daily.   umeclidinium-vilanterol 62.5-25 MCG/INH Aepb Commonly known as:  ANORO ELLIPTA Inhale 1 puff into the lungs daily.       Major procedures and Radiology Reports - PLEASE review detailed and final reports for all  details, in brief -   Dg Chest 2 View  Result Date: 09/29/2017 CLINICAL DATA:  Chest pain, shortness of Breath EXAM: CHEST  2 VIEW COMPARISON:  09/02/2017 FINDINGS: Prior CABG. Heart is mildly enlarged. Peribronchial thickening and interstitial prominence, likely bronchitic changes. IMPRESSION: Borderline heart size.  Bronchitic changes. Electronically Signed   By: Rolm Baptise M.D.   On: 09/29/2017 18:49   Ct Chest Wo Contrast  Result Date: 10/01/2017 CLINICAL DATA:  Dyspnea EXAM: CT CHEST WITHOUT CONTRAST TECHNIQUE: Multidetector CT imaging of the chest was performed following the standard protocol without IV contrast. COMPARISON:  Chest x-ray 10/01/2017 FINDINGS: Cardiovascular: No significant vascular findings. Normal heart size. No pericardial  effusion. Prior CABG. Mediastinum/Nodes: No enlarged mediastinal or axillary lymph nodes. Mediastinal and bilateral hilar calcified lymph nodes as can be seen with prior granulomatous disease. Thyroid gland, trachea, and esophagus demonstrate no significant findings. Lungs/Pleura: Small bilateral pleural effusions, right greater than. Right basilar atelectasis. Bilateral diffuse interstitial thickening. Upper Abdomen: No acute upper abdominal abnormality. Visualized liver demonstrates a micronodular contour or as can be seen with cirrhosis. Small calcifications within the spleen as can be seen with prior granulomatous disease. Musculoskeletal: No acute osseous abnormality. No aggressive osseous lesion. IMPRESSION: 1. Findings concerning for mild pulmonary edema. 2. Liver findings concerning for cirrhosis. Electronically Signed   By: Kathreen Devoid   On: 10/01/2017 16:35   Dg Chest Port 1 View  Result Date: 10/01/2017 CLINICAL DATA:  Shortness of breath EXAM: PORTABLE CHEST 1 VIEW COMPARISON:  09/29/2016 FINDINGS: Mild reticulonodular opacities in the lungs bilaterally, with a peripheral/subpleural and lower lobe predominance. While infection is possible, this appearance also could reflect chronic interstitial lung disease given the appearance. However, this is progressive from January 2019 and new from 2015. Small bilateral pleural effusions. Cardiomegaly. Pulmonary vascular congestion without frank interstitial edema. Postsurgical changes related to prior CABG. Median sternotomy. IMPRESSION: Mild subpleural/patchy opacities, lower lobe predominant. Multifocal infection is possible. However, in the absence of infectious symptoms, progressive chronic interstitial lung disease is favored. Cardiomegaly with pulmonary vascular congestion. No frank interstitial edema. Small bilateral pleural effusions. Further evaluation of these findings depends on the clinical scenario. If infectious symptoms are favored, follow-up  chest radiographs are suggested in 3-4 weeks after appropriate antimicrobial therapy. If infectious symptoms are not favored, consider routine CT chest without contrast at this time. Regardless, if this appearance persists, outpatient high-resolution CT chest may be beneficial, but this should not be pursued in the acute emergent/inpatient clinical setting. Electronically Signed   By: Julian Hy M.D.   On: 10/01/2017 10:47    Micro Results   No results found for this or any previous visit (from the past 240 hour(s)).     Today   Subjective    [name] today has no new complaints, patient is eating and drinking well, no concerns about further GI bleed, no chest pains no palpitations,  ambulated more than 300 feet without chest pains palpitations or hypoxia          Patient has been seen and examined prior to discharge   Objective   Blood pressure (!) 124/53, pulse 68, temperature 98 F (36.7 C), temperature source Oral, resp. rate 18, height 5\' 4"  (1.626 m), weight 124.2 kg (273 lb 13 oz), SpO2 97 %.   Intake/Output Summary (Last 24 hours) at 10/05/2017 1641 Last data filed at 10/05/2017 1224 Gross per 24 hour  Intake 660 ml  Output 2050 ml  Net -1390 ml  Exam Gen:- Awake  In no apparent distress  HEENT:- [place].AT,   Neck-Supple Neck,No JVD,  Lungs- mostly clear  CV- S1, S2 normal Abd-  +ve B.Sounds, Abd Soft, No tenderness,    Extremity/Skin:- Intact peripheral pulses , at least 2+ pitting edema bilaterally Psych-appropriate affect Neuro-gait is steady, no new focal deficits  Data Review   CBC w Diff:  Lab Results  Component Value Date   WBC 4.6 10/05/2017   HGB 9.1 (L) 10/05/2017   HCT 30.0 (L) 10/05/2017   PLT 142 (L) 10/05/2017   LYMPHOPCT 21 08/25/2017   MONOPCT 7 08/25/2017   EOSPCT 3 08/25/2017   BASOPCT 0 08/25/2017    CMP:  Lab Results  Component Value Date   NA 136 10/05/2017   K 3.9 10/05/2017   CL 98 (L) 10/05/2017   CO2 26 10/05/2017    BUN 15 10/05/2017   CREATININE 1.28 (H) 10/05/2017   CREATININE 0.75 06/09/2015   PROT 6.5 09/29/2017   ALBUMIN 3.3 (L) 09/29/2017   BILITOT 0.8 09/29/2017   ALKPHOS 92 09/29/2017   AST 25 09/29/2017   ALT 21 09/29/2017  .   Total Discharge time is about 33 minutes  Roxan Hockey M.D on 10/05/2017 at 4:41 PM  [facility]   Office  [phone]  Voice Recognition Viviann Spare dictation system was used to create this note, attempts have been made to correct errors. Please contact the author with questions and/or clarifications.

## 2017-10-05 NOTE — Consult Note (Signed)
            [facility] CM Primary Care Navigator  10/05/2017  [name] 06-Feb-1946 [id number]   Wenttoseepatient at the bedsideto identify possible discharge needs buthe was justdischargedper staff report.   PerMD note, patient presented with complaints of intermittent shortness of breath and epigastric pain. He was treated mainly for upper GI bleed and acute blood loss anemia.  Patient was discharged home today.  Primary care provider's officeis listed asprovidingtransition of care (TOC). Patient hasdischarge instruction to follow-up withprimary care providerwithin 1 week .  Primary care provider's office called (spoke to [name]) to notify ofpatient'shealth issues needing follow-up. Made aware to refer patient to [facility] care management if deemed necessaryandappropriate for services.  For additional questions please contact:  Edwena Felty A. Haadiya Frogge, BSN, RN-BC [facility] PRIMARY CARE Navigator Cell: [phone]

## 2017-10-05 NOTE — Care Management Note (Addendum)
Case Management Note  Patient Details  Name: [name] MRN: [id number] Date of Birth: Aug 22, 1946  Expected Discharge Date:  10/09/2017               Expected Discharge Plan:  Home/Self Care  Discharge planning Services  CM Consult  Status of Service:  Completed, signed off  If discussed at Long Length of Stay Meetings, dates discussed:   Potential  Discharge Date:  10/09/2017   Kristen Cardinal, RN  Nurse case manager [phone] 10/05/2017, 11:29 AM --  10/05/2017 3:30 PM  In to speak with patient. Patient states awaiting lab results if good per MD will be discharged todayKimberly Corrin Parker, RN.  States no discharge needs at this time.  PCP is Pricilla Holm.    10/05/17 5:00PM  Discussed recommendations for [facility] RN with patient and granddaughter.  Offered choice and patient/grandaughter selected [facility]. Referral called to Butch Penny with [facility].  Kristen Cardinal, RN

## 2017-10-05 NOTE — Progress Notes (Signed)
Patient discharged with Home Health RN services.

## 2017-10-05 NOTE — Progress Notes (Signed)
Patient Discharge: Disposition: Patient discharged to home with family. Education: Reviewed medications, prescriptions, follow-up appointments and discharge instructions, verbalized understanding.   IV: Discontinued IV before discharge. Telemetry: Discontinued Tele before discharge, CCMD notified. Transportation: Patient escorted out of the unit in w/c till car. Belongings: Patient took all his belongings with him.

## 2017-10-06 ENCOUNTER — Telehealth: Payer: Self-pay | Admitting: *Deleted

## 2017-10-06 NOTE — Telephone Encounter (Signed)
Transition Care Management Follow-up Telephone Call   Date discharged? 10/05/17   How have you been since you were released from the hospital? Spoke w/son [name]) he was not at the house with him at that time, but he states dad is doing alright. Son has authorization to make appts for father.     Do you understand why you were in the hospital? YES   Do you understand the discharge instructions? YES   Where were you discharged to? Home   Items Reviewed:  Medications reviewed: YES  Allergies reviewed: YES  Dietary changes reviewed: YES  Referrals reviewed: No referral needed   Functional Questionnaire:   Activities of Daily Living (ADLs):   He states dad are independent in the following: bathing and hygiene, feeding, continence, grooming, toileting and dressing States he require assistance with the following: ambulation   Any transportation issues/concerns?: NO, [name] states he will be the one bringing him   Any patient concerns? NO   Confirmed importance and date/time of follow-up visits scheduled YES, appt 10/12/17  Provider Appointment booked with Dr. Sharlet Salina  Confirmed with patient if condition begins to worsen call PCP or go to the ER.  Patient was given the office number and encouraged to call back with question or concerns.  : YES

## 2017-10-07 DIAGNOSIS — E119 Type 2 diabetes mellitus without complications: Secondary | ICD-10-CM | POA: Diagnosis not present

## 2017-10-07 DIAGNOSIS — Z7951 Long term (current) use of inhaled steroids: Secondary | ICD-10-CM | POA: Diagnosis not present

## 2017-10-07 DIAGNOSIS — Z8719 Personal history of other diseases of the digestive system: Secondary | ICD-10-CM | POA: Diagnosis not present

## 2017-10-07 DIAGNOSIS — I509 Heart failure, unspecified: Secondary | ICD-10-CM | POA: Diagnosis not present

## 2017-10-07 DIAGNOSIS — Z7984 Long term (current) use of oral hypoglycemic drugs: Secondary | ICD-10-CM | POA: Diagnosis not present

## 2017-10-07 DIAGNOSIS — K7469 Other cirrhosis of liver: Secondary | ICD-10-CM | POA: Diagnosis not present

## 2017-10-07 DIAGNOSIS — N179 Acute kidney failure, unspecified: Secondary | ICD-10-CM | POA: Diagnosis not present

## 2017-10-07 DIAGNOSIS — I2510 Atherosclerotic heart disease of native coronary artery without angina pectoris: Secondary | ICD-10-CM | POA: Diagnosis not present

## 2017-10-07 DIAGNOSIS — Z951 Presence of aortocoronary bypass graft: Secondary | ICD-10-CM | POA: Diagnosis not present

## 2017-10-07 DIAGNOSIS — D62 Acute posthemorrhagic anemia: Secondary | ICD-10-CM | POA: Diagnosis not present

## 2017-10-07 DIAGNOSIS — J45909 Unspecified asthma, uncomplicated: Secondary | ICD-10-CM | POA: Diagnosis not present

## 2017-10-07 DIAGNOSIS — I110 Hypertensive heart disease with heart failure: Secondary | ICD-10-CM | POA: Diagnosis not present

## 2017-10-07 DIAGNOSIS — G4733 Obstructive sleep apnea (adult) (pediatric): Secondary | ICD-10-CM | POA: Diagnosis not present

## 2017-10-09 ENCOUNTER — Encounter (INDEPENDENT_AMBULATORY_CARE_PROVIDER_SITE_OTHER): Payer: Self-pay

## 2017-10-09 ENCOUNTER — Encounter: Payer: Self-pay | Admitting: Cardiovascular Disease

## 2017-10-09 ENCOUNTER — Ambulatory Visit (INDEPENDENT_AMBULATORY_CARE_PROVIDER_SITE_OTHER): Payer: PPO | Admitting: Cardiovascular Disease

## 2017-10-09 VITALS — BP 108/44 | HR 64 | Ht 65.0 in | Wt 255.0 lb

## 2017-10-09 DIAGNOSIS — I5032 Chronic diastolic (congestive) heart failure: Secondary | ICD-10-CM

## 2017-10-09 DIAGNOSIS — I2510 Atherosclerotic heart disease of native coronary artery without angina pectoris: Secondary | ICD-10-CM

## 2017-10-09 MED ORDER — METOPROLOL TARTRATE 25 MG PO TABS: 12.5000 mg | ORAL_TABLET | Freq: Two times a day (BID) | ORAL | 3 refills | Status: DC

## 2017-10-09 NOTE — Patient Instructions (Signed)
Medication Instructions:  Your physician recommends that you continue on your current medications as directed. Please refer to the Current Medication list given to you today.   Labwork: Your physician recommends that you return for lab work in: 6 months on the day of or a few days before your office visit  You will need to FAST for this appointment - nothing to eat or drink after midnight the night before except water.   Testing/Procedures: None Ordered   Follow-Up: Your physician wants you to follow-up in: 6 months with Dr. Elmarie Shiley PA or Nurse Practitioner.  You will receive a reminder letter in the mail two months in advance. If you don't receive a letter, please call our office to schedule the follow-up appointment.   If you need a refill on your cardiac medications before your next appointment, please call your pharmacy.   Thank you for choosing [facility]! Christen Bame, RN [phone]

## 2017-10-09 NOTE — Progress Notes (Signed)
Cardiology Office Note   Date:  10/09/2017   ID:  [name], DOB [birth year], MRN [id number]  PCP:  [name], MD  Cardiologist:   Mertie Moores, MD   Chief Complaint  Patient presents with  . Coronary Artery Disease  . Hypertension  . Hyperlipidemia       Notes prior to 2014:   [name] is a 72 y.o. male who presents for follow up of his CAD   Expand All Collapse All      [name] Date of BirthNovember 18, 1947   [place] [address] [place], [place], [place], [place] [address] [phone]  Fax [phone]  Problem List: 1. CAD - 3.0 x 28 mm drug-eluting stent (06-28-12), s/p CABG  2. Indigestion 3. Anemia     [name] is a 72 yo with hx of indigestion / chest pain that develops with walking. The pain is a "ache" and lasts as long as he is walking. It causes him some dyspnea. It typically goes away when he stops. He has lots of belching. Hx of asthma. Hx of smoking in the past. No regular exercise. He is now retired. He gets a little exercise every day.  He had a dobutamine echo that revealed mild ischemia in the inferior wall.   He had a cardiac cath which revealed a tight irregular LAD stenosis. He had PTCA and stenting of his LAD by Dr. Burt Knack.  We have advised him to stop eating fried foods. He still is eating fried potatoes on a regular basis.   Jan 14, 2013:  Sprong has done well from a cardiac standpoint. He is having some problems with anemia. He was scheduled have a colonoscopy tomorrow but I did not want to stop the Effient and aspirin. The colonoscopy was canceled.  Nov. 17, 2014:  [name] is doing well. He is  now a year out from his stenting and can stop the Effient for his colonoscopy. He does some exercise. He has completely ignored my dietary suggestions from previous visits. Still eats everything that he wants to. Having some abdominal pains.   Oct. 28, 2015:  [name] is doing OK. He's had a hospital admission on [name] 31 and was found have severe left main disease as well as severe coronary disease. He had coronary artery bypass grafting on August 3. (CABG)X3 LIMA-LAD; SVG-OM; SVG-PD  Still has some chest soreness     Jan. 28, 2016:  [name] is doing well .  No CP . A bit or soreness from shoveling snow.  No angina while shoveling , just chest wall soreness.  The pain is on the left upper chest. Tender to palpitation. Not related to walking.  Has been present for 4-5 days ( since it snowed) not associated with dyspnea , sweats, or dizziness  No dyspnea.   Trying to stick to his diet. Not as successful during the holidays.   [name] 1, 2016: No angina. Has some chest wall tenderness - worse with coughing . Still leg edema   Oct. 11, 2016:  Had CABG August , 2015 Still having some MSK pain in his chest .  No angina   Staying pretty active.  Is retired.  Does lots of activities around the house and yard.   December 08, 2015:  [name] was seen today for follow up .  Hx of CABG BP and HR are well controlled.  Able to do all of his  normal activities  October 09, 2017: [name] is seen back today for follow-up of his coronary artery disease, hypertension, hyperlipidemia.  He was recently admitted to the hospital for a GI bleed on September 29, 2017. Hb was 5.6 on admission   Has been avoiding salty foods.   Past Medical History:  Diagnosis Date  . Arthritis    "joints tighten up sometimes" (03/27/2104)  . Asthma   . CHF (congestive heart failure) ([place])   . Chronic low back pain   . Chronic lower back pain   . Coronary artery disease    a. 05/2012 Cath/PCI: LM 40ost, 50-60d, LAD  99p ruptured plaque (3.0x28 DES), LCX 50p/m, RCA 30-40p, 66m, EF 65-70%.  . Family history of adverse reaction to anesthesia    Pt granddaughter had PONV  . GERD (gastroesophageal reflux disease)   . Hepatic cirrhosis ([place])    a. Dx 01/2014 - CT a/p   . History of blood transfusion    "related to bleeding ulcers"  . History of concussion    1976--  NO RESIDUAL  . History of GI bleed    a. UGIB 07/2012;  b. 01/2014 admission with GIB/FOB stool req 1U prbc's->EGD showed portal gastropathy, barrett's esoph, and chronic active h. pylori gastritis.  Marland Kitchen History of gout    2007 &  2008  LEFT LEG-- NO ISSUE SINCE  . Hyperlipidemia   . Iron deficiency anemia   . Kidney stones   . Lipoma   . OA (osteoarthritis of spine)    LOWER BACK--  INTERMITTANT LEFT LEG NUMBNESS  . OSA (obstructive sleep apnea)    PULMOLOGIST-  DR CLANCE--  MODERATE OSA  STARTED CPAP 2012--  BUT CURRENTLY HAS NOT USED PAST 6 MONTHS  . Phimosis    a. s/p circumcision 2015.  . Type 2 diabetes mellitus ([place])   . Unspecified essential hypertension     Past Surgical History:  Procedure Laterality Date  . ANKLE FRACTURE SURGERY Right 1989   "plate put in"  . APPENDECTOMY  05-16-2004   open  . CIRCUMCISION N/A 09/09/2013   Procedure: CIRCUMCISION ADULT;  Surgeon: Bernestine Amass, MD;  Location: [facility];  Service: Urology;  Laterality: N/A;  . COLECTOMY  05-16-2004  . CORONARY ANGIOPLASTY WITH STENT PLACEMENT  06/28/2012  DR COOPER   PCI W/  X1 DES to [place]. LAD/  LM  40% OSTIAL & 50-60% DISTAL /  50% PROX LCX/  30-40% PROX RCA & 50% MID RCA/   LVEF 65-70%  . CORONARY ARTERY BYPASS GRAFT N/A 03/31/2014   Procedure: CORONARY ARTERY BYPASS GRAFTING (CABG) times 3 using left internal mammary artery and right saphenous vein.;  Surgeon: Melrose Nakayama, MD;  Location: [facility];  Service: Open Heart Surgery;  Laterality: N/A;  . DOBUTAMINE STRESS ECHO  06-08-2012   MODERATE HYPOKINESIS/ ISCHEMIA MID INFERIOR WALL    . ESOPHAGOGASTRODUODENOSCOPY  08/15/2012   Procedure: ESOPHAGOGASTRODUODENOSCOPY (EGD);  Surgeon: Wonda Horner, MD;  Location: [facility] ENDOSCOPY;  Service: Endoscopy;  Laterality: N/A;  . ESOPHAGOGASTRODUODENOSCOPY N/A 02/17/2014   Procedure: ESOPHAGOGASTRODUODENOSCOPY (EGD);  Surgeon: Jeryl Columbia, MD;  Location: [facility] ENDOSCOPY;  Service: Endoscopy;  Laterality: N/A;  . ESOPHAGOGASTRODUODENOSCOPY (EGD) WITH PROPOFOL N/A 09/30/2017   Procedure: ESOPHAGOGASTRODUODENOSCOPY (EGD) WITH PROPOFOL;  Surgeon: Wonda Horner, MD;  Location: [facility] ENDOSCOPY;  Service: Endoscopy;  Laterality: N/A;  . HERNIA REPAIR    . INTRAOPERATIVE TRANSESOPHAGEAL ECHOCARDIOGRAM N/A 03/31/2014   Procedure: INTRAOPERATIVE TRANSESOPHAGEAL ECHOCARDIOGRAM;  Surgeon: Remo Lipps  Chaya Jan, MD;  Location: [facility];  Service: Open Heart Surgery;  Laterality: N/A;  . LAPAROSCOPIC UMBILICAL HERNIA REPAIR W/ MESH  06-06-2011  . LEFT HEART CATHETERIZATION WITH CORONARY ANGIOGRAM N/A 03/28/2014   Procedure: LEFT HEART CATHETERIZATION WITH CORONARY ANGIOGRAM;  Surgeon: Sinclair Grooms, MD;  Location: [facility] CATH LAB;  Service: Cardiovascular;  Laterality: N/A;  . LIPOMA EXCISION Left 08/25/2017   Procedure: EXCISION LIPOMA LEFT POSTERIOR THIGH;  Surgeon: Clovis Riley, MD;  Location: [facility];  Service: General;  Laterality: Left;  . NEPHROLITHOTOMY  1990'S  . OPEN APPENDECTOMY W/ PARTIAL CECECTOMY  05-16-2004  . PERCUTANEOUS CORONARY STENT INTERVENTION (PCI-S) N/A 06/28/2012   Procedure: PERCUTANEOUS CORONARY STENT INTERVENTION (PCI-S);  Surgeon: Sherren Mocha, MD;  Location: [facility] CATH LAB;  Service: Cardiovascular;  Laterality: N/A;     Current Outpatient Medications  Medication Sig Dispense Refill  . aspirin EC 81 MG tablet Take 1 tablet (81 mg total) by mouth daily. Restart With Breakfast/Food on 10/18/17    . atorvastatin (LIPITOR) 40 MG tablet TAKE 1 TABLET BY MOUTH DAILY AT 6 PM (Patient taking differently: Take 40 mg by mouth daily at 6 PM.  TAKE 1 TABLET BY MOUTH DAILY AT 6 PM) 90 tablet 0  . ferrous sulfate 325 (65 FE) MG tablet Take 1 tablet (325 mg total) by mouth 2 (two) times daily with a meal. 60 tablet 3  . furosemide (LASIX) 80 MG tablet Take 1 tablet (80 mg total) by mouth 2 (two) times daily. 60 tablet 1  . gabapentin (NEURONTIN) 100 MG capsule Take 1 capsule (100 mg total) by mouth 2 (two) times daily. 60 capsule 0  . losartan (COZAAR) 50 MG tablet TAKE 1 TABLET (50mg ) BY MOUTH EVERY DAY 90 tablet 0  . metFORMIN (GLUCOPHAGE) 850 MG tablet Take 850 mg by mouth 2 (two) times daily with a meal.    . ondansetron (ZOFRAN) 4 MG tablet Take 1 tablet (4 mg total) by mouth every 6 (six) hours as needed for nausea. 20 tablet 0  . pantoprazole (PROTONIX) 40 MG tablet Take 1 tablet (40 mg total) by mouth 2 (two) times daily. 60 tablet 2  . polyethylene glycol (MIRALAX / GLYCOLAX) packet Take 17 g by mouth 2 (two) times daily. 14 each 0  . potassium chloride SA (K-DUR,KLOR-CON) 20 MEQ tablet Take 1 tablet (20 mEq total) by mouth daily. 90 tablet 0  . spironolactone (ALDACTONE) 25 MG tablet Take 0.5 tablets (12.5 mg total) by mouth daily. 15 tablet 1  . umeclidinium-vilanterol (ANORO ELLIPTA) 62.5-25 MCG/INH AEPB Inhale 1 puff into the lungs daily. 30 each 2  . metoprolol tartrate (LOPRESSOR) 25 MG tablet Take 0.5 tablets (12.5 mg total) by mouth 2 (two) times daily. For Heart 30 tablet 1  . nitroGLYCERIN (NITROSTAT) 0.4 MG SL tablet Place 1 tablet (0.4 mg total) under the tongue every 5 (five) minutes as needed for chest pain. 25 tablet 6   No current facility-administered medications for this visit.     Allergies:   Patient has no known allergies.    Social History:  The patient  reports that he quit smoking about 21 years ago. His smoking use included cigarettes. He has a 80.00 pack-year smoking history. he has never used smokeless tobacco. He reports that he drinks alcohol. He reports that he does not use drugs.   Family  History:  The patient's family history includes Cancer in his brother, father, mother, sister, and sister; Colon cancer in his  unknown relative; Coronary artery disease in his unknown relative; Diabetes in his unknown relative; Lung cancer in his sister.    ROS: As noted in current history, all other systems are negative.  Physical Exam: Blood pressure (!) 108/44, pulse 64, height 5\' 5"  (1.651 m), weight 255 lb (115.7 kg), SpO2 94 %.  GEN:   middle aged,   Moderately obese male  HEENT: Normal NECK: No JVD; No carotid bruits LYMPHATICS: No lymphadenopathy CARDIAC: RR,  Soft systolic murmur  RESPIRATORY:  Clear to auscultation without rales, wheezing or rhonchi  ABDOMEN:   Obese  MUSCULOSKELETAL:  No edema; No deformity  SKIN: Warm and dry NEUROLOGIC:  Alert and oriented x 3  EKG:     Recent Labs: 09/29/2017: ALT 21; B Natriuretic Peptide 254.8 10/05/2017: BUN 15; Creatinine, Ser 1.28; Hemoglobin 9.1; Platelets 142; Potassium 3.9; Sodium 136    Lipid Panel    Component Value Date/Time   CHOL 92 (L) 06/09/2015 1609   TRIG 119 06/09/2015 1609   HDL 28 (L) 06/09/2015 1609   CHOLHDL 3.3 06/09/2015 1609   VLDL 24 06/09/2015 1609   LDLCALC 40 06/09/2015 1609      Wt Readings from Last 3 Encounters:  10/09/17 255 lb (115.7 kg)  10/05/17 273 lb 13 oz (124.2 kg)  09/21/17 272 lb (123.4 kg)     Other studies Reviewed: Additional studies/ records that were reviewed today include: . Review of the above records demonstrates:    ASSESSMENT AND PLAN:  1.  CAD :  No angina   2. Essential hypertension:   BP is well controlled.    3. Diabetes mellitus:   4. Obesity:  - advised diet   5. Hyperlipidemia:  - will check labs in 6 months   6. Leg edema :    He has CHF ( likely chronic diastolic CHF although the tissue doppler was difficult to assess)   7.  GI bleed.  Followed by Dr. Watt Climes.   Hx of hepatic cirrhosis.  He also has some evidence of portal gastropathy and signs of  Barrett's esophagus.   Current medicines are reviewed at length with the patient today.  The patient does not have concerns regarding medicines.  The following changes have been made:  no change  Labs/ tests ordered today include:   No orders of the defined types were placed in this encounter.   Disposition:   FU with  Richardson Dopp, PA in 6 months,   Will see me in 1 year.      Signed, Mertie Moores, MD  10/09/2017 10:40 AM    [facility] [address], [place], [place]  [address] Phone: [phone]; Fax: [phone]

## 2017-10-10 ENCOUNTER — Telehealth: Payer: Self-pay | Admitting: Internal Medicine

## 2017-10-10 NOTE — Telephone Encounter (Signed)
Copied from [place]. Topic: Quick Communication - See Telephone Encounter >> Oct 10, 2017  1:52 PM Valla Leaver wrote: CRM for notification. See Telephone encounter for: Donita, RN w/ [facility]  calling for orders for PT evaluation and a medical social worker.    10/10/17.

## 2017-10-10 NOTE — Telephone Encounter (Signed)
Fine but needs visit within 30 days for supervision

## 2017-10-10 NOTE — Telephone Encounter (Signed)
Twice a week

## 2017-10-10 NOTE — Telephone Encounter (Signed)
Copied from [place] [id number]. Topic: Inquiry >> Oct 10, 2017  1:17 PM Margot Ables wrote: Reason for CRM: received orders for una boots but does not include frequency. How often does provider want the pts legs to be wrapped? Once or twice a week was suggested.

## 2017-10-10 NOTE — Telephone Encounter (Signed)
Verbals given  

## 2017-10-10 NOTE — Telephone Encounter (Signed)
Verbals given with MD response

## 2017-10-12 ENCOUNTER — Other Ambulatory Visit (INDEPENDENT_AMBULATORY_CARE_PROVIDER_SITE_OTHER): Payer: PPO

## 2017-10-12 ENCOUNTER — Encounter: Payer: Self-pay | Admitting: Internal Medicine

## 2017-10-12 ENCOUNTER — Other Ambulatory Visit: Payer: Self-pay | Admitting: Internal Medicine

## 2017-10-12 ENCOUNTER — Ambulatory Visit (INDEPENDENT_AMBULATORY_CARE_PROVIDER_SITE_OTHER): Payer: PPO | Admitting: Internal Medicine

## 2017-10-12 VITALS — BP 118/54 | HR 66 | Temp 98.5°F | Ht 65.0 in | Wt 253.0 lb

## 2017-10-12 DIAGNOSIS — E139 Other specified diabetes mellitus without complications: Secondary | ICD-10-CM | POA: Diagnosis not present

## 2017-10-12 DIAGNOSIS — R0602 Shortness of breath: Secondary | ICD-10-CM | POA: Diagnosis not present

## 2017-10-12 DIAGNOSIS — K219 Gastro-esophageal reflux disease without esophagitis: Secondary | ICD-10-CM | POA: Diagnosis not present

## 2017-10-12 DIAGNOSIS — D62 Acute posthemorrhagic anemia: Secondary | ICD-10-CM

## 2017-10-12 DIAGNOSIS — R600 Localized edema: Secondary | ICD-10-CM | POA: Diagnosis not present

## 2017-10-12 DIAGNOSIS — G4733 Obstructive sleep apnea (adult) (pediatric): Secondary | ICD-10-CM | POA: Diagnosis not present

## 2017-10-12 DIAGNOSIS — K264 Chronic or unspecified duodenal ulcer with hemorrhage: Secondary | ICD-10-CM

## 2017-10-12 DIAGNOSIS — I10 Essential (primary) hypertension: Secondary | ICD-10-CM | POA: Diagnosis not present

## 2017-10-12 DIAGNOSIS — I25810 Atherosclerosis of coronary artery bypass graft(s) without angina pectoris: Secondary | ICD-10-CM | POA: Diagnosis not present

## 2017-10-12 DIAGNOSIS — E118 Type 2 diabetes mellitus with unspecified complications: Secondary | ICD-10-CM

## 2017-10-12 LAB — COMPREHENSIVE METABOLIC PANEL
ALK PHOS: 95 U/L (ref 39–117)
ALT: 14 U/L (ref 0–53)
AST: 19 U/L (ref 0–37)
Albumin: 3.9 g/dL (ref 3.5–5.2)
BUN: 48 mg/dL — AB (ref 6–23)
CHLORIDE: 104 meq/L (ref 96–112)
CO2: 27 mEq/L (ref 19–32)
CREATININE: 1.91 mg/dL — AB (ref 0.40–1.50)
Calcium: 8.9 mg/dL (ref 8.4–10.5)
GFR: 37.04 mL/min — ABNORMAL LOW (ref >60.00)
GLUCOSE: 116 mg/dL — AB (ref 70–99)
POTASSIUM: 4.4 meq/L (ref 3.5–5.1)
SODIUM: 139 meq/L (ref 135–145)
TOTAL PROTEIN: 7.2 g/dL (ref 6.0–8.3)
Total Bilirubin: 0.5 mg/dL (ref 0.2–1.2)

## 2017-10-12 LAB — CBC
HCT: 27.7 % — ABNORMAL LOW (ref 39.0–52.0)
Hemoglobin: 8.9 g/dL — ABNORMAL LOW (ref 13.0–17.0)
MCHC: 32.2 g/dL (ref 30.0–36.0)
MCV: 83.2 fl (ref 78.0–100.0)
PLATELETS: 116 10*3/uL — AB (ref 150.0–400.0)
RBC: 3.33 Mil/uL — ABNORMAL LOW (ref 4.22–5.81)
RDW: 18.3 % — AB (ref 11.5–15.5)
WBC: 3.7 10*3/uL — ABNORMAL LOW (ref 4.0–10.5)

## 2017-10-12 NOTE — Progress Notes (Signed)
   Subjective:    Patient ID: [name], male    DOB: May 24, 1946, 72 y.o.   MRN: [id number]  HPI The patient is a 72 YO man coming in for hospital follow up (in for anemia with epigastric pain and bleeding ulcers, has had this in the past many years ago, felt very similar, had EGD, given 4 units blood, started on ppis). He is still taking the protonix at home. Denies epigastric pain now. Denies blood in stool. Is taking the iron pills daily and having some dark stools. Denies nausea or vomiting. Eating well. Feels like energy is back to normal. Denies SOB or chest pains. Has leg swelling which is stable. Still working with PT at home. Denies dizziness or lightheadedness.   PMH, [facility], social history reviewed and updated.   Review of Systems  Constitutional: Negative.   HENT: Negative.   Eyes: Negative.   Respiratory: Negative for cough, chest tightness and shortness of breath.   Cardiovascular: Negative for chest pain, palpitations and leg swelling.  Gastrointestinal: Negative for abdominal distention, abdominal pain, constipation, diarrhea, nausea and vomiting.  Musculoskeletal: Negative.   Skin: Negative.   Neurological: Negative.   Psychiatric/Behavioral: Negative.       Objective:   Physical Exam  Constitutional: He is oriented to person, place, and time. He appears well-developed and well-nourished.  overweight  HENT:  Head: Normocephalic and atraumatic.  Eyes: EOM are normal.  Neck: Normal range of motion.  Cardiovascular: Normal rate and regular rhythm.  Pulmonary/Chest: Effort normal and breath sounds normal. No respiratory distress. He has no wheezes. He has no rales.  Abdominal: Soft. Bowel sounds are normal. He exhibits no distension. There is no tenderness. There is no rebound.  Musculoskeletal: He exhibits edema. He exhibits no tenderness.  Neurological: He is alert and oriented to person, place, and time. Coordination normal.  Skin: Skin is warm and dry.    Psychiatric: He has a normal mood and affect.   Vitals:   10/12/17 1351  BP: (!) 118/54  Pulse: 66  Temp: 98.5 F (36.9 C)  TempSrc: Oral  SpO2: 99%  Weight: 253 lb (114.8 kg)  Height: 5\' 5"  (1.651 m)      Assessment & Plan:

## 2017-10-12 NOTE — Patient Instructions (Signed)
We will check the labs today and call you back about the results.    

## 2017-10-13 NOTE — Assessment & Plan Note (Signed)
No signs of recurrence at home, checking CBC today. Taking iron pills.

## 2017-10-13 NOTE — Assessment & Plan Note (Signed)
Stable and no worsening lately. No flare while in the hospital. Working with PT on conditioning.

## 2017-10-13 NOTE — Assessment & Plan Note (Signed)
No chest pains or demand ischemia with recent hospitalization.

## 2017-10-13 NOTE — Assessment & Plan Note (Signed)
Had been taking some pain meds at home. Now is not doing that. Taking protonix and will likely need to continue indefinitely since this is his second major bleeding episode.

## 2017-10-16 ENCOUNTER — Telehealth: Payer: Self-pay | Admitting: Internal Medicine

## 2017-10-16 NOTE — Telephone Encounter (Signed)
Copied from [place]. Topic: General - Other >> Oct 16, 2017  5:13 PM Cecelia Byars, NT wrote: Reason for CRM: [facility] called and needs verbal orders for 1 every other week  for 3 weeks  Please call Shaunda at [phone]

## 2017-10-17 NOTE — Telephone Encounter (Signed)
Notified Shaunda w/MD response.Marland KitchenJohny Chess

## 2017-10-17 NOTE — Telephone Encounter (Signed)
Fine

## 2017-10-23 ENCOUNTER — Ambulatory Visit: Payer: PPO | Admitting: Internal Medicine

## 2017-10-24 ENCOUNTER — Other Ambulatory Visit: Payer: Self-pay | Admitting: Internal Medicine

## 2017-10-24 ENCOUNTER — Other Ambulatory Visit (INDEPENDENT_AMBULATORY_CARE_PROVIDER_SITE_OTHER): Payer: PPO

## 2017-10-24 ENCOUNTER — Telehealth: Payer: Self-pay | Admitting: Internal Medicine

## 2017-10-24 ENCOUNTER — Encounter: Payer: Self-pay | Admitting: Internal Medicine

## 2017-10-24 ENCOUNTER — Ambulatory Visit (INDEPENDENT_AMBULATORY_CARE_PROVIDER_SITE_OTHER): Payer: PPO | Admitting: Internal Medicine

## 2017-10-24 VITALS — BP 140/70 | HR 69 | Temp 97.9°F | Ht 65.0 in | Wt 259.0 lb

## 2017-10-24 DIAGNOSIS — D500 Iron deficiency anemia secondary to blood loss (chronic): Secondary | ICD-10-CM

## 2017-10-24 DIAGNOSIS — K264 Chronic or unspecified duodenal ulcer with hemorrhage: Secondary | ICD-10-CM | POA: Diagnosis not present

## 2017-10-24 DIAGNOSIS — N179 Acute kidney failure, unspecified: Secondary | ICD-10-CM | POA: Insufficient documentation

## 2017-10-24 LAB — COMPREHENSIVE METABOLIC PANEL
ALT: 16 U/L (ref 0–53)
AST: 25 U/L (ref 0–37)
Albumin: 3.7 g/dL (ref 3.5–5.2)
Alkaline Phosphatase: 110 U/L (ref 39–117)
BUN: 28 mg/dL — ABNORMAL HIGH (ref 6–23)
CHLORIDE: 101 meq/L (ref 96–112)
CO2: 31 meq/L (ref 19–32)
Calcium: 9.2 mg/dL (ref 8.4–10.5)
Creatinine, Ser: 1.3 mg/dL (ref 0.40–1.50)
GFR: 57.74 mL/min — AB (ref >60.00)
Glucose, Bld: 176 mg/dL — ABNORMAL HIGH (ref 70–99)
POTASSIUM: 3.6 meq/L (ref 3.5–5.1)
Sodium: 139 mEq/L (ref 135–145)
Total Bilirubin: 0.5 mg/dL (ref 0.2–1.2)
Total Protein: 7.3 g/dL (ref 6.0–8.3)

## 2017-10-24 LAB — CBC
HCT: 27.7 % — ABNORMAL LOW (ref 39.0–52.0)
HEMOGLOBIN: 9.2 g/dL — AB (ref 13.0–17.0)
MCHC: 33.1 g/dL (ref 30.0–36.0)
MCV: 83.6 fl (ref 78.0–100.0)
PLATELETS: 156 10*3/uL (ref 150.0–400.0)
RBC: 3.32 Mil/uL — AB (ref 4.22–5.81)
RDW: 18.5 % — ABNORMAL HIGH (ref 11.5–15.5)
WBC: 4.4 10*3/uL (ref 4.0–10.5)

## 2017-10-24 MED ORDER — BLOOD GLUCOSE METER KIT: PACK | 0 refills | Status: DC

## 2017-10-24 NOTE — Telephone Encounter (Signed)
Copied from [place]. Topic: Quick Communication - See Telephone Encounter >> Oct 24, 2017 12:29 PM Burnis Medin, NT wrote: CRM for notification. See Telephone encounter for: Jenny Reichmann is calling to get verbal orders for social work for community service for follow up visits. Pls call back for [phone]  10/24/17.

## 2017-10-24 NOTE — Telephone Encounter (Signed)
Notified cindy w/ MD verbal../lmb

## 2017-10-24 NOTE — Assessment & Plan Note (Signed)
Taking iron still and no recurrent bleeding at home. Needs CBC today.

## 2017-10-24 NOTE — Patient Instructions (Signed)
We will check the labs today and call you back about the results.    

## 2017-10-24 NOTE — Telephone Encounter (Signed)
Fine

## 2017-10-24 NOTE — Assessment & Plan Note (Signed)
With AKI on last labs and needs recheck today. Has increased fluid intake since last visit. Adjust as needed. Fluid status normal on exam.

## 2017-10-24 NOTE — Progress Notes (Signed)
   Subjective:    Patient ID: [name], male    DOB: 1946-08-27, 72 y.o.   MRN: [id number]  HPI The patient is a 72 YO man coming in for follow up of his AKI. He was doing better in the hospital and then went home and did not drink any liquids and testing at last visit had some more kidney injury. He is drinking more liquids now and back to normal activity level. He is eating well also. Denies change in medications. Denies blood in stool. Denies nausea or vomiting or diarrhea.   Review of Systems  Constitutional: Negative.   HENT: Negative.   Eyes: Negative.   Respiratory: Positive for cough. Negative for chest tightness and shortness of breath.   Cardiovascular: Negative for chest pain, palpitations and leg swelling.  Gastrointestinal: Negative for abdominal distention, abdominal pain, constipation, diarrhea, nausea and vomiting.  Musculoskeletal: Negative.   Skin: Negative.   Neurological: Negative.   Psychiatric/Behavioral: Negative.       Objective:   Physical Exam  Constitutional: He is oriented to person, place, and time. He appears well-developed and well-nourished.  obese  HENT:  Head: Normocephalic and atraumatic.  Eyes: EOM are normal.  Neck: Normal range of motion.  Cardiovascular: Normal rate and regular rhythm.  Pulmonary/Chest: Effort normal and breath sounds normal. No respiratory distress. He has no wheezes. He has no rales.  Abdominal: Soft. Bowel sounds are normal. He exhibits no distension. There is no tenderness. There is no rebound.  Musculoskeletal: He exhibits no edema.  Neurological: He is alert and oriented to person, place, and time. Coordination normal.  Skin: Skin is warm and dry.   Vitals:   10/24/17 1016  BP: 140/70  Pulse: 69  Temp: 97.9 F (36.6 C)  TempSrc: Oral  SpO2: 97%  Weight: 259 lb (117.5 kg)  Height: 5\' 5"  (1.651 m)      Assessment & Plan:

## 2017-10-25 DIAGNOSIS — Z7951 Long term (current) use of inhaled steroids: Secondary | ICD-10-CM | POA: Diagnosis not present

## 2017-10-25 DIAGNOSIS — E119 Type 2 diabetes mellitus without complications: Secondary | ICD-10-CM | POA: Diagnosis not present

## 2017-10-25 DIAGNOSIS — G4733 Obstructive sleep apnea (adult) (pediatric): Secondary | ICD-10-CM | POA: Diagnosis not present

## 2017-10-25 DIAGNOSIS — J45909 Unspecified asthma, uncomplicated: Secondary | ICD-10-CM | POA: Diagnosis not present

## 2017-10-25 DIAGNOSIS — Z7984 Long term (current) use of oral hypoglycemic drugs: Secondary | ICD-10-CM | POA: Diagnosis not present

## 2017-10-25 DIAGNOSIS — Z951 Presence of aortocoronary bypass graft: Secondary | ICD-10-CM | POA: Diagnosis not present

## 2017-10-25 DIAGNOSIS — K7469 Other cirrhosis of liver: Secondary | ICD-10-CM | POA: Diagnosis not present

## 2017-10-25 DIAGNOSIS — I110 Hypertensive heart disease with heart failure: Secondary | ICD-10-CM | POA: Diagnosis not present

## 2017-10-25 DIAGNOSIS — I509 Heart failure, unspecified: Secondary | ICD-10-CM | POA: Diagnosis not present

## 2017-10-25 DIAGNOSIS — I2510 Atherosclerotic heart disease of native coronary artery without angina pectoris: Secondary | ICD-10-CM | POA: Diagnosis not present

## 2017-10-25 DIAGNOSIS — D62 Acute posthemorrhagic anemia: Secondary | ICD-10-CM | POA: Diagnosis not present

## 2017-10-25 DIAGNOSIS — N179 Acute kidney failure, unspecified: Secondary | ICD-10-CM | POA: Diagnosis not present

## 2017-10-26 DIAGNOSIS — I2510 Atherosclerotic heart disease of native coronary artery without angina pectoris: Secondary | ICD-10-CM | POA: Diagnosis not present

## 2017-10-26 DIAGNOSIS — N179 Acute kidney failure, unspecified: Secondary | ICD-10-CM | POA: Diagnosis not present

## 2017-10-26 DIAGNOSIS — Z951 Presence of aortocoronary bypass graft: Secondary | ICD-10-CM | POA: Diagnosis not present

## 2017-10-26 DIAGNOSIS — Z7951 Long term (current) use of inhaled steroids: Secondary | ICD-10-CM | POA: Diagnosis not present

## 2017-10-26 DIAGNOSIS — D62 Acute posthemorrhagic anemia: Secondary | ICD-10-CM | POA: Diagnosis not present

## 2017-10-26 DIAGNOSIS — Z8719 Personal history of other diseases of the digestive system: Secondary | ICD-10-CM | POA: Diagnosis not present

## 2017-10-26 DIAGNOSIS — Z7984 Long term (current) use of oral hypoglycemic drugs: Secondary | ICD-10-CM | POA: Diagnosis not present

## 2017-10-30 DIAGNOSIS — K274 Chronic or unspecified peptic ulcer, site unspecified, with hemorrhage: Secondary | ICD-10-CM | POA: Diagnosis not present

## 2017-10-30 DIAGNOSIS — K269 Duodenal ulcer, unspecified as acute or chronic, without hemorrhage or perforation: Secondary | ICD-10-CM | POA: Diagnosis not present

## 2017-10-31 DIAGNOSIS — I10 Essential (primary) hypertension: Secondary | ICD-10-CM | POA: Diagnosis not present

## 2017-10-31 DIAGNOSIS — E139 Other specified diabetes mellitus without complications: Secondary | ICD-10-CM | POA: Diagnosis not present

## 2017-10-31 DIAGNOSIS — G4733 Obstructive sleep apnea (adult) (pediatric): Secondary | ICD-10-CM | POA: Diagnosis not present

## 2017-10-31 DIAGNOSIS — R600 Localized edema: Secondary | ICD-10-CM | POA: Diagnosis not present

## 2017-11-20 ENCOUNTER — Emergency Department (HOSPITAL_COMMUNITY)
Admit: 2017-11-20 | Discharge: 2017-11-20 | Disposition: A | Discharge status: Home / Self Care | Payer: PPO | Attending: Emergency Medicine | Admitting: Emergency Medicine

## 2017-11-20 ENCOUNTER — Inpatient Hospital Stay (HOSPITAL_COMMUNITY)
Admission: EM | Admit: 2017-11-20 | Discharge: 2017-11-23 | DRG: 603 | Disposition: A | Discharge status: Home / Self Care | Payer: PPO | Attending: Internal Medicine | Admitting: Internal Medicine

## 2017-11-20 ENCOUNTER — Other Ambulatory Visit: Payer: Self-pay

## 2017-11-20 ENCOUNTER — Encounter (HOSPITAL_COMMUNITY): Payer: Self-pay

## 2017-11-20 ENCOUNTER — Emergency Department (HOSPITAL_COMMUNITY): Payer: PPO

## 2017-11-20 DIAGNOSIS — E871 Hypo-osmolality and hyponatremia: Secondary | ICD-10-CM | POA: Diagnosis not present

## 2017-11-20 DIAGNOSIS — Z7984 Long term (current) use of oral hypoglycemic drugs: Secondary | ICD-10-CM | POA: Diagnosis not present

## 2017-11-20 DIAGNOSIS — L03116 Cellulitis of left lower limb: Principal | ICD-10-CM | POA: Diagnosis present

## 2017-11-20 DIAGNOSIS — R609 Edema, unspecified: Secondary | ICD-10-CM | POA: Diagnosis not present

## 2017-11-20 DIAGNOSIS — E785 Hyperlipidemia, unspecified: Secondary | ICD-10-CM

## 2017-11-20 DIAGNOSIS — Z6838 Body mass index (BMI) 38.0-38.9, adult: Secondary | ICD-10-CM

## 2017-11-20 DIAGNOSIS — D631 Anemia in chronic kidney disease: Secondary | ICD-10-CM | POA: Diagnosis present

## 2017-11-20 DIAGNOSIS — N183 Chronic kidney disease, stage 3 (moderate): Secondary | ICD-10-CM | POA: Diagnosis present

## 2017-11-20 DIAGNOSIS — Z87442 Personal history of urinary calculi: Secondary | ICD-10-CM | POA: Diagnosis not present

## 2017-11-20 DIAGNOSIS — I5022 Chronic systolic (congestive) heart failure: Secondary | ICD-10-CM

## 2017-11-20 DIAGNOSIS — G4733 Obstructive sleep apnea (adult) (pediatric): Secondary | ICD-10-CM | POA: Diagnosis present

## 2017-11-20 DIAGNOSIS — K7460 Unspecified cirrhosis of liver: Secondary | ICD-10-CM | POA: Diagnosis present

## 2017-11-20 DIAGNOSIS — Z87820 Personal history of traumatic brain injury: Secondary | ICD-10-CM | POA: Diagnosis not present

## 2017-11-20 DIAGNOSIS — K219 Gastro-esophageal reflux disease without esophagitis: Secondary | ICD-10-CM | POA: Diagnosis present

## 2017-11-20 DIAGNOSIS — E669 Obesity, unspecified: Secondary | ICD-10-CM | POA: Diagnosis not present

## 2017-11-20 DIAGNOSIS — Z951 Presence of aortocoronary bypass graft: Secondary | ICD-10-CM

## 2017-11-20 DIAGNOSIS — Z87891 Personal history of nicotine dependence: Secondary | ICD-10-CM | POA: Diagnosis not present

## 2017-11-20 DIAGNOSIS — E118 Type 2 diabetes mellitus with unspecified complications: Secondary | ICD-10-CM | POA: Diagnosis not present

## 2017-11-20 DIAGNOSIS — I2510 Atherosclerotic heart disease of native coronary artery without angina pectoris: Secondary | ICD-10-CM | POA: Diagnosis not present

## 2017-11-20 DIAGNOSIS — M79672 Pain in left foot: Secondary | ICD-10-CM | POA: Diagnosis not present

## 2017-11-20 DIAGNOSIS — I130 Hypertensive heart and chronic kidney disease with heart failure and stage 1 through stage 4 chronic kidney disease, or unspecified chronic kidney disease: Secondary | ICD-10-CM | POA: Diagnosis present

## 2017-11-20 DIAGNOSIS — R600 Localized edema: Secondary | ICD-10-CM | POA: Diagnosis not present

## 2017-11-20 DIAGNOSIS — I5032 Chronic diastolic (congestive) heart failure: Secondary | ICD-10-CM | POA: Diagnosis present

## 2017-11-20 DIAGNOSIS — Z79899 Other long term (current) drug therapy: Secondary | ICD-10-CM

## 2017-11-20 DIAGNOSIS — M545 Low back pain: Secondary | ICD-10-CM | POA: Diagnosis present

## 2017-11-20 DIAGNOSIS — M479 Spondylosis, unspecified: Secondary | ICD-10-CM | POA: Diagnosis not present

## 2017-11-20 DIAGNOSIS — E1122 Type 2 diabetes mellitus with diabetic chronic kidney disease: Secondary | ICD-10-CM | POA: Diagnosis present

## 2017-11-20 DIAGNOSIS — Z7982 Long term (current) use of aspirin: Secondary | ICD-10-CM

## 2017-11-20 DIAGNOSIS — M7989 Other specified soft tissue disorders: Secondary | ICD-10-CM | POA: Diagnosis present

## 2017-11-20 DIAGNOSIS — S99922A Unspecified injury of left foot, initial encounter: Secondary | ICD-10-CM | POA: Diagnosis not present

## 2017-11-20 DIAGNOSIS — L0390 Cellulitis, unspecified: Secondary | ICD-10-CM | POA: Diagnosis present

## 2017-11-20 DIAGNOSIS — M109 Gout, unspecified: Secondary | ICD-10-CM | POA: Diagnosis not present

## 2017-11-20 DIAGNOSIS — G8929 Other chronic pain: Secondary | ICD-10-CM | POA: Diagnosis present

## 2017-11-20 DIAGNOSIS — J449 Chronic obstructive pulmonary disease, unspecified: Secondary | ICD-10-CM | POA: Diagnosis present

## 2017-11-20 LAB — CBC WITH DIFFERENTIAL/PLATELET
BASOS ABS: 0 10*3/uL (ref 0.0–0.1)
Basophils Relative: 0 %
EOS PCT: 1 %
Eosinophils Absolute: 0.1 10*3/uL (ref 0.0–0.7)
HCT: 35.4 % — ABNORMAL LOW (ref 39.0–52.0)
Hemoglobin: 10.6 g/dL — ABNORMAL LOW (ref 13.0–17.0)
LYMPHS PCT: 12 %
Lymphs Abs: 0.8 10*3/uL (ref 0.7–4.0)
MCH: 25.8 pg — ABNORMAL LOW (ref 26.0–34.0)
MCHC: 29.9 g/dL — ABNORMAL LOW (ref 30.0–36.0)
MCV: 86.1 fL (ref 78.0–100.0)
MONO ABS: 0.7 10*3/uL (ref 0.1–1.0)
Monocytes Relative: 11 %
Neutro Abs: 4.9 10*3/uL (ref 1.7–7.7)
Neutrophils Relative %: 76 %
PLATELETS: 177 10*3/uL (ref 150–400)
RBC: 4.11 MIL/uL — ABNORMAL LOW (ref 4.22–5.81)
RDW: 15.3 % (ref 11.5–15.5)
WBC: 6.4 10*3/uL (ref 4.0–10.5)

## 2017-11-20 LAB — BASIC METABOLIC PANEL
Anion gap: 11 (ref 5–15)
BUN: 24 mg/dL — AB (ref 6–20)
CALCIUM: 8.9 mg/dL (ref 8.9–10.3)
CO2: 28 mmol/L (ref 22–32)
CREATININE: 1.23 mg/dL (ref 0.61–1.24)
Chloride: 100 mmol/L — ABNORMAL LOW (ref 101–111)
GFR calc Af Amer: >60 mL/min (ref >60)
GFR, EST NON AFRICAN AMERICAN: 57 mL/min — AB (ref >60)
GLUCOSE: 124 mg/dL — AB (ref 65–99)
Potassium: 3.8 mmol/L (ref 3.5–5.1)
Sodium: 139 mmol/L (ref 135–145)

## 2017-11-20 LAB — I-STAT CG4 LACTIC ACID, ED: Lactic Acid, Venous: 1.38 mmol/L (ref 0.5–1.9)

## 2017-11-20 LAB — URIC ACID: Uric Acid, Serum: 10.1 mg/dL — ABNORMAL HIGH (ref 4.4–7.6)

## 2017-11-20 LAB — GLUCOSE, CAPILLARY: GLUCOSE-CAPILLARY: 154 mg/dL — AB (ref 65–99)

## 2017-11-20 LAB — SEDIMENTATION RATE: Sed Rate: 75 mm/hr — ABNORMAL HIGH (ref 0–16)

## 2017-11-20 MED ORDER — CLINDAMYCIN PHOSPHATE 600 MG/50ML IV SOLN
600.0000 mg | Freq: Three times a day (TID) | INTRAVENOUS | Status: DC
  Administered 2017-11-21 – 2017-11-23 (×8): 600 mg via INTRAVENOUS
  Filled 2017-11-20 (×9): qty 50

## 2017-11-20 MED ORDER — FUROSEMIDE 40 MG PO TABS
80.0000 mg | ORAL_TABLET | Freq: Two times a day (BID) | ORAL | Status: DC
  Administered 2017-11-20 – 2017-11-22 (×4): 80 mg via ORAL
  Filled 2017-11-20 (×4): qty 2

## 2017-11-20 MED ORDER — NITROGLYCERIN 0.4 MG SL SUBL: 0.4000 mg | SUBLINGUAL_TABLET | SUBLINGUAL | Status: DC | PRN

## 2017-11-20 MED ORDER — LOSARTAN POTASSIUM 50 MG PO TABS: 50.0000 mg | ORAL_TABLET | Freq: Every day | ORAL | Status: DC

## 2017-11-20 MED ORDER — PANTOPRAZOLE SODIUM 40 MG PO TBEC
40.0000 mg | DELAYED_RELEASE_TABLET | Freq: Two times a day (BID) | ORAL | Status: DC
  Administered 2017-11-20 – 2017-11-23 (×6): 40 mg via ORAL
  Filled 2017-11-20 (×6): qty 1

## 2017-11-20 MED ORDER — ENOXAPARIN SODIUM 40 MG/0.4ML ~~LOC~~ SOLN
40.0000 mg | SUBCUTANEOUS | Status: DC
  Administered 2017-11-20 – 2017-11-22 (×3): 40 mg via SUBCUTANEOUS
  Filled 2017-11-20 (×3): qty 0.4

## 2017-11-20 MED ORDER — UMECLIDINIUM-VILANTEROL 62.5-25 MCG/INH IN AEPB
1.0000 | INHALATION_SPRAY | Freq: Every day | RESPIRATORY_TRACT | Status: DC
  Administered 2017-11-22 – 2017-11-23 (×2): 1 via RESPIRATORY_TRACT
  Filled 2017-11-20: qty 14

## 2017-11-20 MED ORDER — IPRATROPIUM-ALBUTEROL 0.5-2.5 (3) MG/3ML IN SOLN: 3.0000 mL | Freq: Four times a day (QID) | RESPIRATORY_TRACT | Status: DC | PRN

## 2017-11-20 MED ORDER — METOPROLOL TARTRATE 25 MG PO TABS
25.0000 mg | ORAL_TABLET | Freq: Two times a day (BID) | ORAL | Status: DC
  Administered 2017-11-20 – 2017-11-23 (×5): 25 mg via ORAL
  Filled 2017-11-20 (×6): qty 1

## 2017-11-20 MED ORDER — ATORVASTATIN CALCIUM 40 MG PO TABS
40.0000 mg | ORAL_TABLET | Freq: Every day | ORAL | Status: DC
  Administered 2017-11-21 – 2017-11-22 (×2): 40 mg via ORAL
  Filled 2017-11-20 (×2): qty 1

## 2017-11-20 MED ORDER — ONDANSETRON HCL 4 MG PO TABS: 4.0000 mg | ORAL_TABLET | Freq: Four times a day (QID) | ORAL | Status: DC | PRN

## 2017-11-20 MED ORDER — SPIRONOLACTONE 12.5 MG HALF TABLET
12.5000 mg | ORAL_TABLET | Freq: Every day | ORAL | Status: DC
  Filled 2017-11-20: qty 1

## 2017-11-20 MED ORDER — INSULIN ASPART 100 UNIT/ML ~~LOC~~ SOLN
0.0000 [IU] | Freq: Three times a day (TID) | SUBCUTANEOUS | Status: DC
  Administered 2017-11-21 – 2017-11-23 (×7): 1 [IU] via SUBCUTANEOUS

## 2017-11-20 MED ORDER — ONDANSETRON HCL 4 MG/2ML IJ SOLN: 4.0000 mg | Freq: Four times a day (QID) | INTRAMUSCULAR | Status: DC | PRN

## 2017-11-20 MED ORDER — GABAPENTIN 100 MG PO CAPS
100.0000 mg | ORAL_CAPSULE | Freq: Two times a day (BID) | ORAL | Status: DC
  Administered 2017-11-20 – 2017-11-23 (×6): 100 mg via ORAL
  Filled 2017-11-20 (×6): qty 1

## 2017-11-20 MED ORDER — ASPIRIN EC 81 MG PO TBEC
81.0000 mg | DELAYED_RELEASE_TABLET | Freq: Every day | ORAL | Status: DC
  Administered 2017-11-20 – 2017-11-23 (×4): 81 mg via ORAL
  Filled 2017-11-20 (×4): qty 1

## 2017-11-20 MED ORDER — CLINDAMYCIN PHOSPHATE 600 MG/50ML IV SOLN
600.0000 mg | Freq: Once | INTRAVENOUS | Status: AC
  Administered 2017-11-20: 600 mg via INTRAVENOUS
  Filled 2017-11-20: qty 50

## 2017-11-20 MED ORDER — FERROUS SULFATE 325 (65 FE) MG PO TABS
325.0000 mg | ORAL_TABLET | Freq: Two times a day (BID) | ORAL | Status: DC
  Administered 2017-11-21 – 2017-11-23 (×5): 325 mg via ORAL
  Filled 2017-11-20 (×5): qty 1

## 2017-11-20 MED ORDER — POTASSIUM CHLORIDE CRYS ER 20 MEQ PO TBCR
20.0000 meq | EXTENDED_RELEASE_TABLET | Freq: Every day | ORAL | Status: DC
  Administered 2017-11-21 – 2017-11-23 (×3): 20 meq via ORAL
  Filled 2017-11-20 (×3): qty 1

## 2017-11-20 MED ORDER — KETOROLAC TROMETHAMINE 30 MG/ML IJ SOLN
15.0000 mg | Freq: Once | INTRAMUSCULAR | Status: AC
  Administered 2017-11-20: 15 mg via INTRAVENOUS
  Filled 2017-11-20: qty 1

## 2017-11-20 NOTE — H&P (Addendum)
History and Physical    [name] HFG:[id number] DOB: [birth year] DOA: 11/20/2017  Referring MD/NP/PA: Joseph Pierini, M.D. PCP: [name], MD  Patient coming from: home    Chief Complaint: Left leg swelling  I have personally briefly reviewed patient's old medical records in [place]   HPI: [name] is a 72 y.o. male with medical history significant of CAD s/p CABG, CHF, DM type II, hepatic cirrhosis, upper GI bleed followed Dr. Watt Climes, and OSA not on CPAP; who presents with complaints of left foot and ankle pain and swelling over the last 3-4 days.  He reports being out in his yard when he first noticed mild pain shooting across his foot.  Denies any falls or trauma to the leg.  Over the next couple days noted progressive swelling and worsening pain to the point which she was unable to bear weight on the affected extremity.  Tried putting rubbing alcohol without any improvement of symptoms.  He had to remove his Unna boot that he normally wears left leg due to the pain and swelling.  Patient has a history of gout, but reports this feels different.  Denies any fever, chills, weight gain, chest pain, shortness of breath, nausea, vomiting, diarrhea, blood in stools, or dysuria.  He had just been hospitalized last month after being found to have acute GI bleed with hemoglobin of 5.6 g/dL. Patient found to have duodenal ulcer and erosive gastropathy on EGD receiving 4 units of packed red blood cells.  ED Course: Upon admission into the emergency department patient was found to be afebrile, with vital signs relatively within normal limits.  Labs revealed WBC 6.4, hemoglobin 10.6, BUN 24, creatinine 1.23, and reported lactic acid 2.3.  X-rays of the left lower extremity showed soft tissue swelling. Doppler ultrasound of the left lower extremity showed no signs of a DVT.  Patient was started on empiric antibiotics of clindamycin for suspected cellulitis.  TRH called to  admit.  Review of Systems  Constitutional: Positive for weight loss. Negative for fever and malaise/fatigue.  HENT: Negative for ear pain and nosebleeds.   Eyes: Negative for double vision and photophobia.  Respiratory: Negative for cough and shortness of breath.   Cardiovascular: Positive for leg swelling. Negative for chest pain.  Gastrointestinal: Negative for abdominal pain, blood in stool, nausea and vomiting.  Genitourinary: Negative for dysuria and frequency.  Musculoskeletal: Positive for joint pain and myalgias. Negative for falls.  Neurological: Negative for loss of consciousness and weakness.  Endo/Heme/Allergies: Negative for environmental allergies and polydipsia.  Psychiatric/Behavioral: Negative for substance abuse. The patient is not nervous/anxious.     Past Medical History:  Diagnosis Date  . Arthritis    "joints tighten up sometimes" (03/27/2104)  . Asthma   . CHF (congestive heart failure) ([place])   . Chronic low back pain   . Chronic lower back pain   . Coronary artery disease    a. 05/2012 Cath/PCI: LM 40ost, 50-60d, LAD 99p ruptured plaque (3.0x28 DES), LCX 50p/m, RCA 30-40p, 43m EF 65-70%.  . Family history of adverse reaction to anesthesia    Pt granddaughter had PONV  . GERD (gastroesophageal reflux disease)   . [place]    a. Dx 01/2014 - CT a/p   . History of blood transfusion    "related to bleeding ulcers"  . History of concussion    1976--  NO RESIDUAL  . History of GI bleed    a. UGIB 07/2012;  b. 01/2014 admission with GIB/FOB stool req 1U prbc's->EGD showed portal gastropathy, barrett's esoph, and chronic active h. pylori gastritis.  Marland Kitchen History of gout    2007 &  2008  LEFT LEG-- NO ISSUE SINCE  . Hyperlipidemia   . Iron deficiency anemia   . Kidney stones   . Lipoma   . OA (osteoarthritis of spine)    LOWER BACK--  INTERMITTANT LEFT LEG NUMBNESS  . OSA (obstructive sleep apnea)    PULMOLOGIST-  DR CLANCE--  MODERATE OSA   STARTED CPAP 2012--  BUT CURRENTLY HAS NOT USED PAST 6 MONTHS  . Phimosis    a. s/p circumcision 2015.  . Type 2 diabetes mellitus ([place])   . Unspecified essential hypertension     Past Surgical History:  Procedure Laterality Date  . ANKLE FRACTURE SURGERY Right 1989   "plate put in"  . APPENDECTOMY  05-16-2004   open  . CIRCUMCISION N/A 09/09/2013   Procedure: CIRCUMCISION ADULT;  Surgeon: Bernestine Amass, MD;  Location[facility];  Service: Urology;  Laterality: N/A;  . COLECTOMY  05-16-2004  . CORONARY ANGIOPLASTY WITH STENT PLACEMENT  06/28/2012  DR COOPER   PCI W/  X1 DES to [place]. LAD/  LM  40% OSTIAL & 50-60% DISTAL /  50% PROX LCX/  30-40% PROX RCA & 50% MID RCA/   LVEF 65-70%  . CORONARY ARTERY BYPASS GRAFT N/A 03/31/2014   Procedure: CORONARY ARTERY BYPASS GRAFTING (CABG) times 3 using left internal mammary artery and right saphenous vein.;  Surgeon: Melrose Nakayama, MD;  Location: [facility];  Service: Open Heart Surgery;  Laterality: N/A;  . DOBUTAMINE STRESS ECHO  06-08-2012   MODERATE HYPOKINESIS/ ISCHEMIA MID INFERIOR WALL  . ESOPHAGOGASTRODUODENOSCOPY  08/15/2012   Procedure: ESOPHAGOGASTRODUODENOSCOPY (EGD);  Surgeon: Wonda Horner, MD;  Location: [facility] ENDOSCOPY;  Service: Endoscopy;  Laterality: N/A;  . ESOPHAGOGASTRODUODENOSCOPY N/A 02/17/2014   Procedure: ESOPHAGOGASTRODUODENOSCOPY (EGD);  Surgeon: Jeryl Columbia, MD;  Location: [facility] ENDOSCOPY;  Service: Endoscopy;  Laterality: N/A;  . ESOPHAGOGASTRODUODENOSCOPY (EGD) WITH PROPOFOL N/A 09/30/2017   Procedure: ESOPHAGOGASTRODUODENOSCOPY (EGD) WITH PROPOFOL;  Surgeon: Wonda Horner, MD;  Location: [facility] ENDOSCOPY;  Service: Endoscopy;  Laterality: N/A;  . HERNIA REPAIR    . INTRAOPERATIVE TRANSESOPHAGEAL ECHOCARDIOGRAM N/A 03/31/2014   Procedure: INTRAOPERATIVE TRANSESOPHAGEAL ECHOCARDIOGRAM;  Surgeon: Melrose Nakayama, MD;  Location: [facility];  Service: Open Heart Surgery;  Laterality: N/A;  . LAPAROSCOPIC UMBILICAL  HERNIA REPAIR W/ MESH  06-06-2011  . LEFT HEART CATHETERIZATION WITH CORONARY ANGIOGRAM N/A 03/28/2014   Procedure: LEFT HEART CATHETERIZATION WITH CORONARY ANGIOGRAM;  Surgeon: Sinclair Grooms, MD;  Location: [facility] C[facility] LAB;  Service: Cardiovascular;  Laterality: N/A;  . LIPOMA EXCISION Left 08/25/2017   Procedure: EXCISION LIPOMA LEFT POSTERIOR THIGH;  Surgeon: Clovis Riley, MD;  Location: [place];  Service: General;  Laterality: Left;  . NEPHROLITHOTOMY  1990'S  . OPEN APPENDECTOMY W/ PARTIAL CECECTOMY  05-16-2004  . PERCUTANEOUS CORONARY STENT INTERVENTION (PCI-S) N/A 06/28/2012   Procedure: PERCUTANEOUS CORONARY STENT INTERVENTION (PCI-S);  Surgeon: Sherren Mocha, MD;  Location: [facility] CATH LAB;  Service: Cardiovascular;  Laterality: N/A;     reports that he quit smoking about 21 years ago. His smoking use included cigarettes. He has a 80.00 pack-year smoking history. He has never used smokeless tobacco. He reports that he drinks alcohol. He reports that he does not use drugs.  No Known Allergies  Family History  Problem Relation Age of Onset  .  Lung cancer Sister   . Cancer Sister        lung  . Cancer Mother   . Cancer Father        died in his 36s.  . Cancer Brother        lung  . Coronary artery disease Unknown   . Diabetes Unknown   . Colon cancer Unknown   . Cancer Sister        lung    Prior to Admission medications   Medication Sig Start Date End Date Taking? Authorizing Provider  aspirin EC 81 MG tablet Take 1 tablet (81 mg total) by mouth daily. Restart With Breakfast/Food on 10/18/17 10/05/17  Yes Emokpae, Courage, MD  atorvastatin (LIPITOR) 40 MG tablet TAKE 1 TABLET BY MOUTH DAILY AT 6 PM Patient taking differently: Take 40 mg by mouth daily at 6 PM. TAKE 1 TABLET BY MOUTH DAILY AT 6 PM 07/04/17  Yes [name], MD  esomeprazole (NEXIUM) 20 MG capsule Take 20 mg by mouth 2 (two) times daily.   Yes [provider]  ferrous sulfate 325 (65 FE) MG  tablet Take 1 tablet (325 mg total) by mouth 2 (two) times daily with a meal. 10/05/17  Yes Emokpae, Courage, MD  furosemide (LASIX) 80 MG tablet Take 1 tablet (80 mg total) by mouth 2 (two) times daily. 10/05/17  Yes Emokpae, Courage, MD  gabapentin (NEURONTIN) 100 MG capsule TAKE 1 CAPSULE(100 MG) BY MOUTH TWICE DAILY 10/13/17  Yes [name], MD  losartan (COZAAR) 50 MG tablet TAKE 1 TABLET (76m) BY MOUTH EVERY DAY 10/05/17  Yes Emokpae, Courage, MD  metFORMIN (GLUCOPHAGE) 850 MG tablet Take 850 mg by mouth 2 (two) times daily with a meal.   Yes [provider]  metoprolol tartrate (LOPRESSOR) 25 MG tablet Take 0.5 tablets (12.5 mg total) by mouth 2 (two) times daily. For Heart 10/09/17 10/09/18 Yes Nahser, PWonda Cheng MD  potassium chloride SA (K-DUR,KLOR-CON) 20 MEQ tablet Take 1 tablet (20 mEq total) by mouth daily. 09/21/17  Yes CHoyt Koch MD  umeclidinium-vilanterol (ANORO ELLIPTA) 62.5-25 MCG/INH AEPB Inhale 1 puff into the lungs daily. 09/21/17  Yes CHoyt Koch MD  blood glucose meter kit and supplies Dispense based on patient and insurance preference. Use up to four times daily as directed. (FOR ICD-10 E10.9, E11.9). 10/24/17   CHoyt Koch MD  nitroGLYCERIN (NITROSTAT) 0.4 MG SL tablet Place 1 tablet (0.4 mg total) under the tongue every 5 (five) minutes as needed for chest pain. 06/08/16 09/30/17  Nahser, PWonda Cheng MD  ondansetron (ZOFRAN) 4 MG tablet Take 1 tablet (4 mg total) by mouth every 6 (six) hours as needed for nausea. Patient not taking: Reported on 11/20/2017 10/05/17   ERoxan Hockey MD  OW[facility]emed NorthVERIO test strip USE TO TEST UP TO FOUR TIMES DAILY AS DIRECTED 10/24/17   CHoyt Koch MD  pantoprazole (PROTONIX) 40 MG tablet Take 1 tablet (40 mg total) by mouth 2 (two) times daily. Patient not taking: Reported on 11/20/2017 10/05/17   ERoxan Hockey MD  polyethylene glycol (MIRALAX / GLYCOLAX) packet Take 17 g by mouth 2 (two) times  daily. Patient not taking: Reported on 11/20/2017 10/05/17   ERoxan Hockey MD  spironolactone (ALDACTONE) 25 MG tablet Take 0.5 tablets (12.5 mg total) by mouth daily. 10/05/17 10/05/18  ERoxan Hockey MD    Physical Exam:  Constitutional: Obese male in NAD, calm, comfortable Vitals:   11/20/17 1730 11/20/17 1800 11/20/17  1830 11/20/17 1845  BP: (!) 160/74 138/75 125/69 137/63  Pulse: 72 67 66 67  Resp:    16  Temp:      TempSrc:      SpO2: 97% 93% 95% 96%  Weight:      Height:       Eyes: PERRL, lids and conjunctivae normal ENMT: Mucous membranes are moist. Posterior pharynx clear of any exudate or lesions. Neck: normal, supple, no masses, no thyromegaly Respiratory: clear to auscultation bilaterally, no wheezing, no crackles. Normal respiratory effort. No accessory muscle use.  Cardiovascular: Regular rate and rhythm, no murmurs / rubs / gallops.  2+ pitting lower extremity edema. 2+ pedal pulses. No carotid bruits.   Abdomen: no tenderness, no masses palpated. No hepatosplenomegaly. Bowel sounds positive.  Musculoskeletal: no clubbing / cyanosis. No joint deformity upper and lower extremities. Good ROM, no contractures. Normal muscle tone.  Skin: Mild erythema noted of the left forefoot and ankle with increased warmth Neurologic: CN 2-12 grossly intact. Sensation intact, DTR normal. Strength 5/5 in all 4.  Psychiatric: Normal judgment and insight. Alert and oriented x 3. Normal mood.     Labs on Admission: I have personally reviewed following labs and imaging studies  CBC: Recent Labs  Lab 11/20/17 1735  WBC 6.4  NEUTROABS 4.9  HGB 10.6*  HCT 35.4*  MCV 86.1  PLT 101   Basic Metabolic Panel: Recent Labs  Lab 11/20/17 1735  NA 139  K 3.8  CL 100*  CO2 28  GLUCOSE 124*  BUN 24*  CREATININE 1.23  CALCIUM 8.9   GFR: Estimated Creatinine Clearance: 65.4 mL/min (by C-G formula based on SCr of 1.23 mg/dL). Liver Function Tests: No results for input(s): AST,  ALT, ALKPHOS, BILITOT, PROT, ALBUMIN in the last 168 hours. No results for input(s): LIPASE, AMYLASE in the last 168 hours. No results for input(s): AMMONIA in the last 168 hours. Coagulation Profile: No results for input(s): INR, PROTIME in the last 168 hours. Cardiac Enzymes: No results for input(s): CKTOTAL, CKMB, CKMBINDEX, TROPONINI in the last 168 hours. BNP (last 3 results) No results for input(s): PROBNP in the last 8760 hours. HbA1C: No results for input(s): HGBA1C in the last 72 hours. CBG: No results for input(s): GLUCAP in the last 168 hours. Lipid Profile: No results for input(s): CHOL, HDL, LDLCALC, TRIG, CHOLHDL, LDLDIRECT in the last 72 hours. Thyroid Function Tests: No results for input(s): TSH, T4TOTAL, FREET4, T3FREE, THYROIDAB in the last 72 hours. Anemia Panel: No results for input(s): VITAMINB12, FOLATE, FERRITIN, TIBC, IRON, RETICCTPCT in the last 72 hours. Urine analysis:    Component Value Date/Time   COLORURINE YELLOW 03/29/2014 1546   APPEARANCEUR CLEAR 03/29/2014 1546   LABSPEC 1.011 03/29/2014 1546   PHURINE 6.0 03/29/2014 1546   GLUCOSEU NEGATIVE 03/29/2014 1546   HGBUR NEGATIVE 03/29/2014 1546   BILIRUBINUR NEGATIVE 03/29/2014 1546   KETONESUR NEGATIVE 03/29/2014 1546   PROTEINUR NEGATIVE 03/29/2014 1546   UROBILINOGEN 0.2 03/29/2014 1546   NITRITE NEGATIVE 03/29/2014 1546   LEUKOCYTESUR NEGATIVE 03/29/2014 1546   Sepsis Labs: No results found for this or any previous visit (from the past 240 hour(s)).   Radiological Exams on Admission: Dg Foot Complete Left  Result Date: 11/20/2017 CLINICAL DATA:  Left foot pain and swelling EXAM: LEFT FOOT - COMPLETE 3+ VIEW COMPARISON:  07/04/2009 FINDINGS: Negative for fracture. Mild degenerative change in the first MTP joint. Calcaneal spurring. Arterial calcification. Diffuse soft tissue swelling around the metatarsophalangeal joints. IMPRESSION: Soft tissue  swelling.  No acute skeletal abnormality.  Electronically Signed   By: Franchot Gallo M.D.   On: 11/20/2017 16:53    EKG: Independently reviewed.  Sinus rhythm at 68 bpm  Assessment/Plan Cellulitis of the left lower extremity: Acute.  Patient presents with left foot and ankle swelling and erythema over the last 3-4 days.  Imaging studies show no signs of a DVT and soft tissue swelling.  Treated with clindamycin.  Differential includes cellulitis vs. gout flare vs. other. - Admit to MedSurg - Follow-up blood culture - Add on ESR, CRP, and uric acid level - Continue clindamycin - Elevate lower extremity  Anemia of chronic disease: Hemoglobin 10.6 which appears improved from previous discharge.  No signs of bleeding. - Recheck CBC in a.m.  Chronic kidney disease stage III : Creatinine appears near patient's baseline at 1.2. - Continue to monitor  Diastolic CHF: Stable.  Patient does not appear to be grossly fluid overloaded at this time.  Last EF noted to be 60-65% on echocardiogram from 10/02/2017. - Strict intake and output - Continue Lasix and spironolactone  CAD s/p CABG - Continue aspirin and statin  Essential hypertension: Blood pressures are noted to be intermittently soft. - Continue losartan, metoprolol, and  Diabetes mellitus type 2: Well controlled last hemoglobin A1c noted to be 6.5 on 08/25/2017. - Hypoglycemic protocols - Hold metformin - CBGs q. before meals with sensitive SSI  Hepatic cirrhosis - Continue spironolactone and Lasix  History of asthma/COPD - Continue Anoro Ellipta - DuoNeb's prn shortness of breath/wheezing  Hyperlipidemia - Continue atorvastatin   DVT prophylaxis: lovenox Code Status: Full  Family Communication: no family present at bedside Disposition Plan: Likely discharge home in 2-3 days  Consults called: none  Admission status: observation  Norval Morton MD [facility] Pager [phone]   If 7PM-7AM, please contact night-coverage [email or web address] Password  [facility]  11/20/2017, 7:24 PM    .

## 2017-11-20 NOTE — ED Notes (Signed)
ED TO INPATIENT HANDOFF REPORT  Name/Age/Gender [name] 72 y.o. male  Code Status    Code Status Orders  (From admission, onward)        Start     Ordered   11/20/17 1948  Full code  Continuous     11/20/17 1951    Code Status History    Date Active Date Inactive Code Status Order ID Comments User Context   09/29/2017 2212 10/05/2017 2026 Full Code [id number]  Norval Morton, MD ED   03/31/2014 1245 04/03/2014 1638 Full Code [id number]  John Giovanni, PA-C Inpatient   03/28/2014 0903 03/31/2014 1245 Full Code [id number]  Belva Crome, MD Inpatient   03/27/2014 1742 03/28/2014 0903 Full Code [id number]  Rogelia Mire, NP Inpatient   02/17/2014 0313 02/18/2014 1339 Full Code [id number]  Elmarie Shiley, MD Inpatient   08/13/2012 2145 08/17/2012 1438 Full Code [id number]  Almyra Free, RN Inpatient    Advance Directive Documentation     Most Recent Value  Type of Advance Directive  Healthcare Power of Attorney, Living will  Pre-existing out of facility DNR order (yellow form or pink MOST form)  -  "MOST" Form in Place?  -      Home/SNF/Other Home  Chief Complaint L foot pain; swollen   Level of Care/Admitting Diagnosis ED Disposition    ED Disposition Condition [place]: [facility] [[id number]]  Level of Care: Med-Surg [16]  Diagnosis: Cellulitis [970263]  Admitting Physician: Norval Morton [[id number]]  Attending Physician: Norval Morton [[id number]]  PT Class (Do Not Modify): Observation [104]  PT Acc Code (Do Not Modify): Observation [10022]       Medical History Past Medical History:  Diagnosis Date  . Arthritis    "joints tighten up sometimes" (03/27/2104)  . Asthma   . CHF (congestive heart failure) ([place])   . Chronic low back pain   . Chronic lower back pain   . Coronary artery disease    a. 05/2012 Cath/PCI: LM 40ost, 50-60d, LAD 99p ruptured plaque (3.0x28 DES), LCX 50p/m, RCA 30-40p, 2m EF 65-70%.  . Family  history of adverse reaction to anesthesia    Pt granddaughter had PONV  . GERD (gastroesophageal reflux disease)   . [place]    a. Dx 01/2014 - CT a/p   . History of blood transfusion    "related to bleeding ulcers"  . History of concussion    1976--  NO RESIDUAL  . History of GI bleed    a. UGIB 07/2012;  b. 01/2014 admission with GIB/FOB stool req 1U prbc's->EGD showed portal gastropathy, barrett's esoph, and chronic active h. pylori gastritis.  .Marland KitchenHistory of gout    2007 &  2008  LEFT LEG-- NO ISSUE SINCE  . Hyperlipidemia   . Iron deficiency anemia   . Kidney stones   . Lipoma   . OA (osteoarthritis of spine)    LOWER BACK--  INTERMITTANT LEFT LEG NUMBNESS  . OSA (obstructive sleep apnea)    PULMOLOGIST-  DR CLANCE--  MODERATE OSA  STARTED CPAP 2012--  BUT CURRENTLY HAS NOT USED PAST 6 MONTHS  . Phimosis    a. s/p circumcision 2015.  . Type 2 diabetes mellitus ([place]   . Unspecified essential hypertension     Allergies No Known Allergies  IV Location/Drains/Wounds Patient Lines/Drains/Airways Status   Active Line/Drains/Airways    Name:   Placement date:  Placement time:   Site:   Days:   Peripheral IV 10/03/17 Left Forearm   10/03/17    0940    Forearm   48          Labs/Imaging Results for orders placed or performed during the hospital encounter of 11/20/17 (from the past 48 hour(s))  Basic metabolic panel     Status: Abnormal   Collection Time: 11/20/17  5:35 PM  Result Value Ref Range   Sodium 139 135 - 145 mmol/L   Potassium 3.8 3.5 - 5.1 mmol/L   Chloride 100 (L) 101 - 111 mmol/L   CO2 28 22 - 32 mmol/L   Glucose, Bld 124 (H) 65 - 99 mg/dL   BUN 24 (H) 6 - 20 mg/dL   Creatinine, Ser 1.23 0.61 - 1.24 mg/dL   Calcium 8.9 8.9 - 10.3 mg/dL   GFR calc non Af Amer 57 (L) >60 mL/min   GFR calc Af Amer >60 >60 mL/min    Comment: (NOTE) The eGFR has been calculated using the CKD EPI equation. This calculation has not been validated in all  clinical situations. eGFR's persistently <60 mL/min signify possible Chronic Kidney Disease.    Anion gap 11 5 - 15    Comment: Performed at [facility], [place] [address] [place], [place] [address]  CBC with Differential     Status: Abnormal   Collection Time: 11/20/17  5:35 PM  Result Value Ref Range   WBC 6.4 4.0 - 10.5 K/uL   RBC 4.11 (L) 4.22 - 5.81 MIL/uL   Hemoglobin 10.6 (L) 13.0 - 17.0 g/dL   HCT 35.4 (L) 39.0 - 52.0 %   MCV 86.1 78.0 - 100.0 fL   MCH 25.8 (L) 26.0 - 34.0 pg   MCHC 29.9 (L) 30.0 - 36.0 g/dL   RDW 15.3 11.5 - 15.5 %   Platelets 177 150 - 400 K/uL   Neutrophils Relative % 76 %   Neutro Abs 4.9 1.7 - 7.7 K/uL   Lymphocytes Relative 12 %   Lymphs Abs 0.8 0.7 - 4.0 K/uL   Monocytes Relative 11 %   Monocytes Absolute 0.7 0.1 - 1.0 K/uL   Eosinophils Relative 1 %   Eosinophils Absolute 0.1 0.0 - 0.7 K/uL   Basophils Relative 0 %   Basophils Absolute 0.0 0.0 - 0.1 K/uL    Comment: Performed at [facility],  [place] [address], [place], [place] [address]   Dg Foot Complete Left  Result Date: 11/20/2017 CLINICAL DATA:  Left foot pain and swelling EXAM: LEFT FOOT - COMPLETE 3+ VIEW COMPARISON:  07/04/2009 FINDINGS: Negative for fracture. Mild degenerative change in the first MTP joint. Calcaneal spurring. Arterial calcification. Diffuse soft tissue swelling around the metatarsophalangeal joints. IMPRESSION: Soft tissue swelling.  No acute skeletal abnormality. Electronically Signed   By: Franchot Gallo M.D.   On: 11/20/2017 16:53    Pending Labs Unresulted Labs (From admission, onward)   Start     Ordered   11/21/17 0500  CBC  Tomorrow morning,   R     11/20/17 1951   11/21/17 1224  Basic metabolic panel  Tomorrow morning,   R     11/20/17 1951   11/20/17 2017  C-reactive protein  Add-on,   R     11/20/17 2016   11/20/17 2016  Sedimentation rate  Add-on,   R     11/20/17 2016   11/20/17 2007  Uric acid  Add-on,  R      11/20/17 2006   11/20/17 1712  Culture, blood (routine x 2)  BLOOD CULTURE X 2,   STAT     11/20/17 1712      Vitals/Pain Today's Vitals   11/20/17 1730 11/20/17 1800 11/20/17 1830 11/20/17 1845  BP: (!) 160/74 138/75 125/69 137/63  Pulse: 72 67 66 67  Resp:    16  Temp:      TempSrc:      SpO2: 97% 93% 95% 96%  Weight:      Height:      PainSc:        Isolation Precautions No active isolations  Medications Medications  clindamycin (CLEOCIN) IVPB 600 mg (has no administration in time range)  atorvastatin (LIPITOR) tablet 40 mg (has no administration in time range)  aspirin EC tablet 81 mg (has no administration in time range)  pantoprazole (PROTONIX) EC tablet 40 mg (has no administration in time range)  ferrous sulfate tablet 325 mg (has no administration in time range)  losartan (COZAAR) tablet 50 mg (has no administration in time range)  furosemide (LASIX) tablet 80 mg (has no administration in time range)  gabapentin (NEURONTIN) capsule 100 mg (has no administration in time range)  metoprolol tartrate (LOPRESSOR) tablet 25 mg (has no administration in time range)  nitroGLYCERIN (NITROSTAT) SL tablet 0.4 mg (has no administration in time range)  spironolactone (ALDACTONE) tablet 12.5 mg (has no administration in time range)  umeclidinium-vilanterol (ANORO ELLIPTA) 62.5-25 MCG/INH 1 puff (has no administration in time range)  potassium chloride SA (K-DUR,KLOR-CON) CR tablet 20 mEq (has no administration in time range)  enoxaparin (LOVENOX) injection 40 mg (has no administration in time range)  ondansetron (ZOFRAN) tablet 4 mg (has no administration in time range)    Or  ondansetron (ZOFRAN) injection 4 mg (has no administration in time range)  ipratropium-albuterol (DUONEB) 0.5-2.5 (3) MG/3ML nebulizer solution 3 mL (has no administration in time range)  insulin aspart (novoLOG) injection 0-9 Units (has no administration in time range)  clindamycin (CLEOCIN) IVPB 600 mg  (0 mg Intravenous Stopped 11/20/17 1822)  ketorolac (TORADOL) 30 MG/ML injection 15 mg (15 mg Intravenous Given 11/20/17 1751)    Mobility non-ambulatory

## 2017-11-20 NOTE — ED Triage Notes (Signed)
Patient has left foot swelling and pain since last night. Patient ws wearing [employer], but left foot was swelling and Una boot was cutting circulation off, so the patient's son cut he boot off.l today, the left foot has increased swelling, redness, and pain on top of the left foot. Patient states he is unable to bear weight on his left foot.

## 2017-11-20 NOTE — ED Notes (Signed)
Lactic Acid: 2.36 Dr. Tamera Punt made aware

## 2017-11-20 NOTE — ED Notes (Signed)
ED Provider at bedside. 

## 2017-11-20 NOTE — Progress Notes (Signed)
LLE venous duplex prelim: negative for DVT. Marianela Mandrell Eunice, RDMS, RVT  

## 2017-11-20 NOTE — ED Provider Notes (Signed)
[facility] DEPT Provider Note   CSN: [id number] Arrival date & time: 11/20/17  1259     History   Chief Complaint Chief Complaint  Patient presents with  . Foot Pain  . Foot Swelling    HPI [name] is a 72 y.o. male.  Patient is a 72 year old male with a history of hyperlipidemia, diabetes, gout, cirrhosis who presents with left foot swelling and pain.  He states he has some chronic edema of his lower extremities but over the last 3 days he has had progressive swelling to his left foot and ankle.  He is noted some redness and increased tenderness to the foot and ankle.  He denies any known injury.  He denies any known fevers.  He denies any calf pain.  No shortness of breath or chest pain.  No history of similar symptoms in the past.  He has had gout but states that this feels differently.  He has been using some alcohol rub to the area without improvement in symptoms.  He did have an Unna boot in place of the foot to help with his chronic swelling but states it was becoming more painful as the foot was swelling and he had to cut it off.     Past Medical History:  Diagnosis Date  . Arthritis    "joints tighten up sometimes" (03/27/2104)  . Asthma   . CHF (congestive heart failure) ([place])   . Chronic low back pain   . Chronic lower back pain   . Coronary artery disease    a. 05/2012 Cath/PCI: LM 40ost, 50-60d, LAD 99p ruptured plaque (3.0x28 DES), LCX 50p/m, RCA 30-40p, 16m EF 65-70%.  . Family history of adverse reaction to anesthesia    Pt granddaughter had PONV  . GERD (gastroesophageal reflux disease)   . Hepatic cirrhosis ([place]    a. Dx 01/2014 - CT a/p   . History of blood transfusion    "related to bleeding ulcers"  . History of concussion    1976--  NO RESIDUAL  . History of GI bleed    a. UGIB 07/2012;  b. 01/2014 admission with GIB/FOB stool req 1U prbc's->EGD showed portal gastropathy, barrett's esoph, and chronic active h.  pylori gastritis.  .Marland KitchenHistory of gout    2007 &  2008  LEFT LEG-- NO ISSUE SINCE  . Hyperlipidemia   . Iron deficiency anemia   . Kidney stones   . Lipoma   . OA (osteoarthritis of spine)    LOWER BACK--  INTERMITTANT LEFT LEG NUMBNESS  . OSA (obstructive sleep apnea)    PULMOLOGIST-  DR CLANCE--  MODERATE OSA  STARTED CPAP 2012--  BUT CURRENTLY HAS NOT USED PAST 6 MONTHS  . Phimosis    a. s/p circumcision 2015.  . Type 2 diabetes mellitus ([place]   . Unspecified essential hypertension     Patient Active Problem List   Diagnosis Date Noted  . AKI (acute kidney injury) ([place] 10/24/2017  . GI bleed 09/29/2017  . SOB (shortness of breath) 09/21/2017  . Morbid obesity ([facility] 02/05/2016  . Numerous moles 09/15/2015  . S/P CABG x 3 03/31/2014  . Hepatic cirrhosis ([place] 02/27/2014  . Hyperlipidemia with target LDL less than 70 06/29/2012  . Coronary artery disease    . OSA (obstructive sleep apnea)   . Type 2 diabetes with complication ([place] 109/47/0962 . GERD 08/04/2010  . History of gout   . Hypertensive heart disease  Past Surgical History:  Procedure Laterality Date  . ANKLE FRACTURE SURGERY Right 1989   "plate put in"  . APPENDECTOMY  05-16-2004   open  . CIRCUMCISION N/A 09/09/2013   Procedure: CIRCUMCISION ADULT;  Surgeon: Bernestine Amass, MD;  Location[facility];  Service: Urology;  Laterality: N/A;  . COLECTOMY  05-16-2004  . CORONARY ANGIOPLASTY WITH STENT PLACEMENT  06/28/2012  DR COOPER   PCI W/  X1 DES to [place]. LAD/  LM  40% OSTIAL & 50-60% DISTAL /  50% PROX LCX/  30-40% PROX RCA & 50% MID RCA/   LVEF 65-70%  . CORONARY ARTERY BYPASS GRAFT N/A 03/31/2014   Procedure: CORONARY ARTERY BYPASS GRAFTING (CABG) times 3 using left internal mammary artery and right saphenous vein.;  Surgeon: Melrose Nakayama, MD;  Location: [facility];  Service: Open Heart Surgery;  Laterality: N/A;  . DOBUTAMINE STRESS ECHO  06-08-2012   MODERATE HYPOKINESIS/ ISCHEMIA MID  INFERIOR WALL  . ESOPHAGOGASTRODUODENOSCOPY  08/15/2012   Procedure: ESOPHAGOGASTRODUODENOSCOPY (EGD);  Surgeon: Wonda Horner, MD;  Location: [facility] ENDOSCOPY;  Service: Endoscopy;  Laterality: N/A;  . ESOPHAGOGASTRODUODENOSCOPY N/A 02/17/2014   Procedure: ESOPHAGOGASTRODUODENOSCOPY (EGD);  Surgeon: Jeryl Columbia, MD;  Location: [facility] ENDOSCOPY;  Service: Endoscopy;  Laterality: N/A;  . ESOPHAGOGASTRODUODENOSCOPY (EGD) WITH PROPOFOL N/A 09/30/2017   Procedure: ESOPHAGOGASTRODUODENOSCOPY (EGD) WITH PROPOFOL;  Surgeon: Wonda Horner, MD;  Location: [facility] ENDOSCOPY;  Service: Endoscopy;  Laterality: N/A;  . HERNIA REPAIR    . INTRAOPERATIVE TRANSESOPHAGEAL ECHOCARDIOGRAM N/A 03/31/2014   Procedure: INTRAOPERATIVE TRANSESOPHAGEAL ECHOCARDIOGRAM;  Surgeon: Melrose Nakayama, MD;  Location: [facility];  Service: Open Heart Surgery;  Laterality: N/A;  . LAPAROSCOPIC UMBILICAL HERNIA REPAIR W/ MESH  06-06-2011  . LEFT HEART CATHETERIZATION WITH CORONARY ANGIOGRAM N/A 03/28/2014   Procedure: LEFT HEART CATHETERIZATION WITH CORONARY ANGIOGRAM;  Surgeon: Sinclair Grooms, MD;  Location[facility] C[facility] LAB;  Service: Cardiovascular;  Laterality: N/A;  . LIPOMA EXCISION Left 08/25/2017   Procedure: EXCISION LIPOMA LEFT POSTERIOR THIGH;  Surgeon: Clovis Riley, MD;  Location: [facility];  Service: General;  Laterality: Left;  . NEPHROLITHOTOMY  1990'S  . OPEN APPENDECTOMY W/ PARTIAL CECECTOMY  05-16-2004  . PERCUTANEOUS CORONARY STENT INTERVENTION (PCI-S) N/A 06/28/2012   Procedure: PERCUTANEOUS CORONARY STENT INTERVENTION (PCI-S);  Surgeon: Sherren Mocha, MD;  Location[facility] C[facility] LAB;  Service: Cardiovascular;  Laterality: N/A;        Home Medications    Prior to Admission medications   Medication Sig Start Date End Date Taking? Authorizing Provider  aspirin EC 81 MG tablet Take 1 tablet (81 mg total) by mouth daily. Restart With Breakfast/Food on 10/18/17 10/05/17  Yes Emokpae, Courage, MD  atorvastatin (LIPITOR) 40 MG tablet  TAKE 1 TABLET BY MOUTH DAILY AT 6 PM Patient taking differently: Take 40 mg by mouth daily at 6 PM. TAKE 1 TABLET BY MOUTH DAILY AT 6 PM 07/04/17  Yes [name], MD  esomeprazole (NEXIUM) 20 MG capsule Take 20 mg by mouth 2 (two) times daily.   Yes [provider]  ferrous sulfate 325 (65 FE) MG tablet Take 1 tablet (325 mg total) by mouth 2 (two) times daily with a meal. 10/05/17  Yes Emokpae, Courage, MD  furosemide (LASIX) 80 MG tablet Take 1 tablet (80 mg total) by mouth 2 (two) times daily. 10/05/17  Yes Emokpae, Courage, MD  gabapentin (NEURONTIN) 100 MG capsule TAKE 1 CAPSULE(100 MG) BY MOUTH TWICE DAILY 10/13/17  Yes  [name], MD  losartan (COZAAR) 50 MG tablet TAKE 1 TABLET (37m) BY MOUTH EVERY DAY 10/05/17  Yes Emokpae, Courage, MD  metFORMIN (GLUCOPHAGE) 850 MG tablet Take 850 mg by mouth 2 (two) times daily with a meal.   Yes [provider]  metoprolol tartrate (LOPRESSOR) 25 MG tablet Take 0.5 tablets (12.5 mg total) by mouth 2 (two) times daily. For Heart 10/09/17 10/09/18 Yes Nahser, PWonda Cheng MD  potassium chloride SA (K-DUR,KLOR-CON) 20 MEQ tablet Take 1 tablet (20 mEq total) by mouth daily. 09/21/17  Yes CHoyt Koch MD  umeclidinium-vilanterol (ANORO ELLIPTA) 62.5-25 MCG/INH AEPB Inhale 1 puff into the lungs daily. 09/21/17  Yes CHoyt Koch MD  blood glucose meter kit and supplies Dispense based on patient and insurance preference. Use up to four times daily as directed. (FOR ICD-10 E10.9, E11.9). 10/24/17   CHoyt Koch MD  nitroGLYCERIN (NITROSTAT) 0.4 MG SL tablet Place 1 tablet (0.4 mg total) under the tongue every 5 (five) minutes as needed for chest pain. 06/08/16 09/30/17  Nahser, PWonda Cheng MD  ondansetron (ZOFRAN) 4 MG tablet Take 1 tablet (4 mg total) by mouth every 6 (six) hours as needed for nausea. Patient not taking: Reported on 11/20/2017 10/05/17   ERoxan Hockey MD  [facility] Center[facility] test strip USE TO TEST UP  TO FOUR TIMES DAILY AS DIRECTED 10/24/17   CHoyt Koch MD  pantoprazole (PROTONIX) 40 MG tablet Take 1 tablet (40 mg total) by mouth 2 (two) times daily. Patient not taking: Reported on 11/20/2017 10/05/17   ERoxan Hockey MD  polyethylene glycol (MIRALAX / GLYCOLAX) packet Take 17 g by mouth 2 (two) times daily. Patient not taking: Reported on 11/20/2017 10/05/17   ERoxan Hockey MD  spironolactone (ALDACTONE) 25 MG tablet Take 0.5 tablets (12.5 mg total) by mouth daily. 10/05/17 10/05/18  ERoxan Hockey MD    Family History Family History  Problem Relation Age of Onset  . Lung cancer Sister   . Cancer Sister        lung  . Cancer Mother   . Cancer Father        died in his 782s  . Cancer Brother        lung  . Coronary artery disease Unknown   . Diabetes Unknown   . Colon cancer Unknown   . Cancer Sister        lung    Social History Social History   Tobacco Use  . Smoking status: Former Smoker    Packs/day: 2.00    Years: 40.00    Pack years: 80.00    Types: Cigarettes    Last attempt to quit: 08/29/1996    Years since quitting: 21.2  . Smokeless tobacco: Never Used  Substance Use Topics  . Alcohol use: Yes    Alcohol/week: 0.0 oz    Comment: occasional  . Drug use: No     Allergies   Patient has no known allergies.   Review of Systems Review of Systems  Constitutional: Negative for chills, diaphoresis, fatigue and fever.  HENT: Negative for congestion, rhinorrhea and sneezing.   Eyes: Negative.   Respiratory: Negative for cough, chest tightness and shortness of breath.   Cardiovascular: Negative for chest pain and leg swelling.  Gastrointestinal: Negative for abdominal pain, blood in stool, diarrhea, nausea and vomiting.  Genitourinary: Negative for difficulty urinating, flank pain, frequency and hematuria.  Musculoskeletal: Positive for arthralgias and joint swelling. Negative for back pain.  Skin:  Positive for color change. Negative for rash.    Neurological: Negative for dizziness, speech difficulty, weakness, numbness and headaches.     Physical Exam Updated Vital Signs BP 137/63   Pulse 67   Temp 99 F (37.2 C) (Oral)   Resp 16   Ht '5\' 7"'  (1.702 m)   Wt 110.7 kg (244 lb)   SpO2 96%   BMI 38.22 kg/m   Physical Exam  Constitutional: He is oriented to person, place, and time. He appears well-developed and well-nourished.  HENT:  Head: Normocephalic and atraumatic.  Eyes: Pupils are equal, round, and reactive to light.  Neck: Normal range of motion. Neck supple.  Cardiovascular: Normal rate, regular rhythm and normal heart sounds.  Pulmonary/Chest: Effort normal and breath sounds normal. No respiratory distress. He has no wheezes. He has no rales. He exhibits no tenderness.  Abdominal: Soft. Bowel sounds are normal. There is no tenderness. There is no rebound and no guarding.  Musculoskeletal: Normal range of motion. He exhibits edema.  Patient has 2+ pitting edema both lower extremities.  He has increased swelling of the left foot and ankle.  There is warmth and erythema over the foot and ankle.  There is no specific joint tenderness.  Pedal pulses are intact.  He has normal sensation to light touch to the foot.  Normal movement of the foot.  No specific calf tenderness.  Lymphadenopathy:    He has no cervical adenopathy.  Neurological: He is alert and oriented to person, place, and time.  Skin: Skin is warm and dry. No rash noted.  Psychiatric: He has a normal mood and affect.     ED Treatments / Results  Labs (all labs ordered are listed, but only abnormal results are displayed) Labs Reviewed  BASIC METABOLIC PANEL - Abnormal; Notable for the following components:      Result Value   Chloride 100 (*)    Glucose, Bld 124 (*)    BUN 24 (*)    GFR calc non Af Amer 57 (*)    All other components within normal limits  CBC WITH DIFFERENTIAL/PLATELET - Abnormal; Notable for the following components:   RBC 4.11  (*)    Hemoglobin 10.6 (*)    HCT 35.4 (*)    MCH 25.8 (*)    MCHC 29.9 (*)    All other components within normal limits  CULTURE, BLOOD (ROUTINE X 2)  CULTURE, BLOOD (ROUTINE X 2)  I-STAT CG4 LACTIC ACID, ED  I-STAT CG4 LACTIC ACID, ED    EKG None  Radiology Dg Foot Complete Left  Result Date: 11/20/2017 CLINICAL DATA:  Left foot pain and swelling EXAM: LEFT FOOT - COMPLETE 3+ VIEW COMPARISON:  07/04/2009 FINDINGS: Negative for fracture. Mild degenerative change in the first MTP joint. Calcaneal spurring. Arterial calcification. Diffuse soft tissue swelling around the metatarsophalangeal joints. IMPRESSION: Soft tissue swelling.  No acute skeletal abnormality. Electronically Signed   By: Franchot Gallo M.D.   On: 11/20/2017 16:53    Procedures Procedures (including critical care time)  Medications Ordered in ED Medications  clindamycin (CLEOCIN) IVPB 600 mg (0 mg Intravenous Stopped 11/20/17 1822)  ketorolac (TORADOL) 30 MG/ML injection 15 mg (15 mg Intravenous Given 11/20/17 1751)     Initial Impression / Assessment and Plan / ED Course  I have reviewed the triage vital signs and the nursing notes.  Pertinent labs & imaging results that were available during my care of the patient were reviewed by me and considered in  my medical decision making (see chart for details).     Patient is a 72 year old male with a history of diabetes who presents with left leg and ankle swelling with redness.  There is no obvious joint effusion.  There is no evidence of DVT on Doppler ultrasound.  His x-rays do not reveal any evidence of fracture.  I do not find any clinical evidence of abscess.  His labs show a normal white blood cell count but his lactate is elevated at 2.36.  He was given clindamycin.  I feel given his symptoms, age and elevated lactate, he should be admitted for IV antibiotics.  I will consult hospitalist for admission.  I spoke with Dr. Tamala Julian who will admit the pt.  Final  Clinical Impressions(s) / ED Diagnoses   Final diagnoses:  Cellulitis of left lower extremity  Peripheral edema    ED Discharge Orders    None       Malvin Johns, MD 11/20/17 [id number]

## 2017-11-20 NOTE — ED Notes (Addendum)
Vascular techs at the bedside.

## 2017-11-20 NOTE — ED Notes (Signed)
Patient given sandwich and sprite

## 2017-11-21 ENCOUNTER — Encounter: Payer: Self-pay | Admitting: *Deleted

## 2017-11-21 DIAGNOSIS — J449 Chronic obstructive pulmonary disease, unspecified: Secondary | ICD-10-CM | POA: Diagnosis present

## 2017-11-21 DIAGNOSIS — G8929 Other chronic pain: Secondary | ICD-10-CM | POA: Diagnosis present

## 2017-11-21 DIAGNOSIS — N183 Chronic kidney disease, stage 3 (moderate): Secondary | ICD-10-CM | POA: Diagnosis present

## 2017-11-21 DIAGNOSIS — Z7984 Long term (current) use of oral hypoglycemic drugs: Secondary | ICD-10-CM | POA: Diagnosis not present

## 2017-11-21 DIAGNOSIS — Z87891 Personal history of nicotine dependence: Secondary | ICD-10-CM | POA: Diagnosis not present

## 2017-11-21 DIAGNOSIS — Z87820 Personal history of traumatic brain injury: Secondary | ICD-10-CM | POA: Diagnosis not present

## 2017-11-21 DIAGNOSIS — E118 Type 2 diabetes mellitus with unspecified complications: Secondary | ICD-10-CM

## 2017-11-21 DIAGNOSIS — I2510 Atherosclerotic heart disease of native coronary artery without angina pectoris: Secondary | ICD-10-CM | POA: Diagnosis present

## 2017-11-21 DIAGNOSIS — E871 Hypo-osmolality and hyponatremia: Secondary | ICD-10-CM | POA: Diagnosis not present

## 2017-11-21 DIAGNOSIS — D631 Anemia in chronic kidney disease: Secondary | ICD-10-CM | POA: Diagnosis present

## 2017-11-21 DIAGNOSIS — L03116 Cellulitis of left lower limb: Secondary | ICD-10-CM | POA: Diagnosis present

## 2017-11-21 DIAGNOSIS — I5032 Chronic diastolic (congestive) heart failure: Secondary | ICD-10-CM | POA: Diagnosis present

## 2017-11-21 DIAGNOSIS — Z951 Presence of aortocoronary bypass graft: Secondary | ICD-10-CM | POA: Diagnosis not present

## 2017-11-21 DIAGNOSIS — Z6838 Body mass index (BMI) 38.0-38.9, adult: Secondary | ICD-10-CM | POA: Diagnosis not present

## 2017-11-21 DIAGNOSIS — M479 Spondylosis, unspecified: Secondary | ICD-10-CM | POA: Diagnosis present

## 2017-11-21 DIAGNOSIS — E785 Hyperlipidemia, unspecified: Secondary | ICD-10-CM | POA: Diagnosis present

## 2017-11-21 DIAGNOSIS — Z87442 Personal history of urinary calculi: Secondary | ICD-10-CM | POA: Diagnosis not present

## 2017-11-21 DIAGNOSIS — I130 Hypertensive heart and chronic kidney disease with heart failure and stage 1 through stage 4 chronic kidney disease, or unspecified chronic kidney disease: Secondary | ICD-10-CM | POA: Diagnosis present

## 2017-11-21 DIAGNOSIS — E1122 Type 2 diabetes mellitus with diabetic chronic kidney disease: Secondary | ICD-10-CM | POA: Diagnosis present

## 2017-11-21 DIAGNOSIS — M7989 Other specified soft tissue disorders: Secondary | ICD-10-CM | POA: Diagnosis present

## 2017-11-21 DIAGNOSIS — E669 Obesity, unspecified: Secondary | ICD-10-CM | POA: Diagnosis present

## 2017-11-21 DIAGNOSIS — G4733 Obstructive sleep apnea (adult) (pediatric): Secondary | ICD-10-CM | POA: Diagnosis present

## 2017-11-21 DIAGNOSIS — K219 Gastro-esophageal reflux disease without esophagitis: Secondary | ICD-10-CM | POA: Diagnosis present

## 2017-11-21 DIAGNOSIS — M545 Low back pain: Secondary | ICD-10-CM | POA: Diagnosis present

## 2017-11-21 DIAGNOSIS — M109 Gout, unspecified: Secondary | ICD-10-CM | POA: Diagnosis present

## 2017-11-21 DIAGNOSIS — K7460 Unspecified cirrhosis of liver: Secondary | ICD-10-CM

## 2017-11-21 LAB — BASIC METABOLIC PANEL
Anion gap: 10 (ref 5–15)
BUN: 27 mg/dL — AB (ref 6–20)
CO2: 29 mmol/L (ref 22–32)
Calcium: 8.8 mg/dL — ABNORMAL LOW (ref 8.9–10.3)
Chloride: 99 mmol/L — ABNORMAL LOW (ref 101–111)
Creatinine, Ser: 1.28 mg/dL — ABNORMAL HIGH (ref 0.61–1.24)
GFR calc Af Amer: >60 mL/min (ref >60)
GFR, EST NON AFRICAN AMERICAN: 55 mL/min — AB (ref >60)
GLUCOSE: 143 mg/dL — AB (ref 65–99)
POTASSIUM: 4 mmol/L (ref 3.5–5.1)
Sodium: 138 mmol/L (ref 135–145)

## 2017-11-21 LAB — CBC
HEMATOCRIT: 33.3 % — AB (ref 39.0–52.0)
Hemoglobin: 10.1 g/dL — ABNORMAL LOW (ref 13.0–17.0)
MCH: 26 pg (ref 26.0–34.0)
MCHC: 30.3 g/dL (ref 30.0–36.0)
MCV: 85.8 fL (ref 78.0–100.0)
PLATELETS: 169 10*3/uL (ref 150–400)
RBC: 3.88 MIL/uL — AB (ref 4.22–5.81)
RDW: 15.4 % (ref 11.5–15.5)
WBC: 5.8 10*3/uL (ref 4.0–10.5)

## 2017-11-21 LAB — GLUCOSE, CAPILLARY
Glucose-Capillary: 127 mg/dL — ABNORMAL HIGH (ref 65–99)
Glucose-Capillary: 128 mg/dL — ABNORMAL HIGH (ref 65–99)

## 2017-11-21 LAB — C-REACTIVE PROTEIN: CRP: 6 mg/dL — ABNORMAL HIGH (ref <1.0)

## 2017-11-21 MED ORDER — KETOROLAC TROMETHAMINE 15 MG/ML IJ SOLN
15.0000 mg | Freq: Once | INTRAMUSCULAR | Status: DC
  Filled 2017-11-21: qty 1

## 2017-11-21 MED ORDER — SPIRONOLACTONE 50 MG PO TABS
50.0000 mg | ORAL_TABLET | Freq: Every day | ORAL | Status: DC
  Administered 2017-11-21: 50 mg via ORAL
  Filled 2017-11-21 (×2): qty 1

## 2017-11-21 NOTE — Progress Notes (Addendum)
[facility] PROGRESS NOTE    Progress Note  [name]  MWU:[id number] DOB: 1946/05/03 DOA: 11/20/2017 PCP: [name], MD     Brief Narrative:   [name] is an 72 y.o. male past medical history of coronary artery disease status post CABG, heart failure, diabetes mellitus type 2 hepatic cirrhosis with upper GI bleed who comes in complaining of left foot and ankle pain and swelling with erythema over the last 4 days.  Assessment/Plan:   Left lower ext Cellulitis Doppler no DVT. started on clindamycin, he has remained afebrile with no leukocytosis. CRP of 6, Keep leg elevated as much as possible.  Anemia of chronic disease: Hemoglobin appears to be at baseline no signs of bleeding hemoglobin at 10.6.  Chronic kidney disease stage III: Creatinine at baseline.  Hold losartan discontinued ketorolac.  Chronic diastolic heart failure: She appears to be euvolemic last echo was this year in February it showed an EF of 60% continue monitor strict I's and O's, continue Lasix and Aldactone monitor electrolytes as needed.  Essential hypertension: Continue losartan and metoprolol.  Diabetes mellitus type 2: A1c of 6.5 hold metformin continue sliding scale.  Hepatic cirrhosis: Continue Lasix he is on a minimal dose of Aldactone will increase Aldactone check a basic metabolic panel in the morning monitor potassium.    DVT prophylaxis: lovenox Family Communication:none Disposition Plan/Barrier to D/C: home in 2 days Code Status:     Code Status Orders  (From admission, onward)        Start     Ordered   11/20/17 1948  Full code  Continuous     11/20/17 1951    Code Status History    Date Active Date Inactive Code Status Order ID Comments User Context   09/29/2017 2212 10/05/2017 2026 Full Code [id number]  Norval Morton, MD ED   03/31/2014 1245 04/03/2014 1638 Full Code [id number]  John Giovanni, PA-C Inpatient   03/28/2014 0903 03/31/2014 1245 Full Code  [id number]  Belva Crome, MD Inpatient   03/27/2014 1742 03/28/2014 0903 Full Code [id number]  Rogelia Mire, NP Inpatient   02/17/2014 0313 02/18/2014 1339 Full Code [id number]  Elmarie Shiley, MD Inpatient   08/13/2012 2145 08/17/2012 1438 Full Code [id number]  Almyra Free, RN Inpatient    Advance Directive Documentation     Most Recent Value  Type of Advance Directive  Healthcare Power of [place], Living will  Pre-existing out of facility DNR order (yellow form or pink MOST form)  -  "MOST" Form in Place?  -        IV Access:    Peripheral IV   Procedures and diagnostic studies:   Dg Foot Complete Left  Result Date: 11/20/2017 CLINICAL DATA:  Left foot pain and swelling EXAM: LEFT FOOT - COMPLETE 3+ VIEW COMPARISON:  07/04/2009 FINDINGS: Negative for fracture. Mild degenerative change in the first MTP joint. Calcaneal spurring. Arterial calcification. Diffuse soft tissue swelling around the metatarsophalangeal joints. IMPRESSION: Soft tissue swelling.  No acute skeletal abnormality. Electronically Signed   By: Franchot Gallo M.D.   On: 11/20/2017 16:53     Medical Consultants:    None.  Anti-Infectives:   IV clindamycin  Subjective:    [name] he relates his leg is still painful.  Objective:    Vitals:   11/20/17 1845 11/20/17 2030 11/20/17 2136 11/21/17 0521  BP: 137/63 (!) 166/81 (!) 125/54 136/63  Pulse: 67 72 70 (!) 58  Resp: 16 16 20 19   Temp:   24.0 F (37.1 C) 98.9 F (37.2 C)  TempSrc:   Oral Oral  SpO2: 96% 98% 98% 96%  Weight:   110.5 kg (243 lb 11.2 oz)   Height:        Intake/Output Summary (Last 24 hours) at 11/21/2017 0853 Last data filed at 11/21/2017 0813 Gross per 24 hour  Intake 670 ml  Output 1050 ml  Net -380 ml   Filed Weights   11/20/17 1407 11/20/17 2136  Weight: 110.7 kg (244 lb) 110.5 kg (243 lb 11.2 oz)    Exam: General exam: In no acute distress. Respiratory system: Good air movement and clear to  auscultation. Cardiovascular system: S1 & S2 heard, RRR Gastrointestinal system: Abdomen is nondistended, soft and nontender.  Central nervous system: Alert and oriented. No focal neurological deficits. Extremities: 3+ edema Skin: No ulcers or rashes, his left lower extremity continues to be erythematous swollen and tender to touch. Psychiatry: Judgement and insight appear normal. Mood & affect appropriate.    Data Reviewed:    Labs: Basic Metabolic Panel: Recent Labs  Lab 11/20/17 1735 11/21/17 0635  NA 139 138  K 3.8 4.0  CL 100* 99*  CO2 28 29  GLUCOSE 124* 143*  BUN 24* 27*  CREATININE 1.23 1.28*  CALCIUM 8.9 8.8*   GFR Estimated Creatinine Clearance: 62.8 mL/min (A) (by C-G formula based on SCr of 1.28 mg/dL (H)). Liver Function Tests: No results for input(s): AST, ALT, ALKPHOS, BILITOT, PROT, ALBUMIN in the last 168 hours. No results for input(s): LIPASE, AMYLASE in the last 168 hours. No results for input(s): AMMONIA in the last 168 hours. Coagulation profile No results for input(s): INR, PROTIME in the last 168 hours.  CBC: Recent Labs  Lab 11/20/17 1735 11/21/17 0635  WBC 6.4 5.8  NEUTROABS 4.9  --   HGB 10.6* 10.1*  HCT 35.4* 33.3*  MCV 86.1 85.8  PLT 177 169   Cardiac Enzymes: No results for input(s): CKTOTAL, CKMB, CKMBINDEX, TROPONINI in the last 168 hours. BNP (last 3 results) No results for input(s): PROBNP in the last 8760 hours. CBG: Recent Labs  Lab 11/20/17 2249 11/21/17 0715  GLUCAP 154* 127*   D-Dimer: No results for input(s): DDIMER in the last 72 hours. Hgb A1c: No results for input(s): HGBA1C in the last 72 hours. Lipid Profile: No results for input(s): CHOL, HDL, LDLCALC, TRIG, CHOLHDL, LDLDIRECT in the last 72 hours. Thyroid function studies: No results for input(s): TSH, T4TOTAL, T3FREE, THYROIDAB in the last 72 hours.  Invalid input(s): FREET3 Anemia work up: No results for input(s): VITAMINB12, FOLATE, FERRITIN, TIBC,  IRON, RETICCTPCT in the last 72 hours. Sepsis Labs: Recent Labs  Lab 11/20/17 1735 11/20/17 2106 11/21/17 0635  WBC 6.4  --  5.8  LATICACIDVEN  --  1.38  --    Microbiology Recent Results (from the past 240 hour(s))  Culture, blood (routine x 2)     Status: None (Preliminary result)   Collection Time: 11/20/17  6:47 PM  Result Value Ref Range Status   Specimen Description   Final    BLOOD LEFT ARM Performed at [facility], [place] [address], [place], [place] [address]    Special Requests   Final    BOTTLES DRAWN AEROBIC AND ANAEROBIC Blood Culture results may not be optimal due to an excessive volume of blood received in culture bottles   Culture PENDING  Incomplete   Report Status PENDING  Incomplete     Medications:   . aspirin EC  81 mg Oral Daily  . atorvastatin  40 mg Oral q1800  . enoxaparin (LOVENOX) injection  40 mg Subcutaneous Q24H  . ferrous sulfate  325 mg Oral BID WC  . furosemide  80 mg Oral BID  . gabapentin  100 mg Oral BID  . insulin aspart  0-9 Units Subcutaneous TID WC  . ketorolac  15 mg Intravenous Once  . losartan  50 mg Oral Daily  . metoprolol tartrate  25 mg Oral BID  . pantoprazole  40 mg Oral BID  . potassium chloride SA  20 mEq Oral Daily  . spironolactone  12.5 mg Oral Daily  . umeclidinium-vilanterol  1 puff Inhalation Daily   Continuous Infusions: . clindamycin (CLEOCIN) IV Stopped (11/21/17 0221)     LOS: 0 days   Charlynne Cousins  [facility] Pager [phone]  *Please refer to [email or web address], password [email or web address] to get updated schedule on who will round on this patient, as hospitalists switch teams weekly. If 7PM-7AM, please contact night-coverage at [email or web address], password [email or web address] for any overnight needs.  11/21/2017, 8:53 AM

## 2017-11-21 NOTE — Consult Note (Addendum)
   [facility] CM Inpatient Consult   11/21/2017  THELBERT GARTIN [birth year] [id number]    Received referral from [facility] UM for [place] Management services.  Went to bedside to speak with Mr. Dismuke to explain and offer [facility] Management program services. He asked that Probation officer contact his son, Gavin Pound as well to discuss. Both patient and son are agreeable to [place] Management and written consent obtained.  Mr. Livecchi endorses he lives with his wife, son, and grandchildren. Confirmed Primary Care MD is Dr. Sharlet Salina. Denies concerns with transportation. States he sometimes has issues with affording his medications. Mr. Vanwieren states he goes to [place] on [address].  Mr. Donahoe asks that his son Pierce Barocio be contacted for post discharge calls at [phone]. Mr. Gervacio endorses he cannot read or write.  Discussed [facility] Care Management RNCM referral for DM and Heart Failure. [place] referral for medication affordability.  Mr. Haueter goes to a PCP office Velora Heckler at [place]) that is listed as doing their own transition of care.  Made inpatient RNCM aware [facility] will follow.    Marthenia Rolling, MSN-Ed, RN,BSN [facility] Liaison [phone]

## 2017-11-21 NOTE — Progress Notes (Signed)
Spoke with patient earlier and he asked that I speak with his son who lives with him and assist with care. Son arrived at bedside and spoke with him and patient. He is having difficulty with affording medication and managing scripts. He has discussed with PCP, he has asked for 90 scripts as they are cheaper. Offered him Goodrx as an option, encouraged him to continue to discuss with PCP, alternatives and to see if meds can be reduced/eliminated. Patient has also agree to [facility] f/u, he is active with [facility] for nurse and PT, will ask attending for resumption orders. Will continue to follow and assist as able. [phone]

## 2017-11-22 LAB — BASIC METABOLIC PANEL
ANION GAP: 12 (ref 5–15)
BUN: 28 mg/dL — AB (ref 6–20)
CHLORIDE: 96 mmol/L — AB (ref 101–111)
CO2: 26 mmol/L (ref 22–32)
Calcium: 8.7 mg/dL — ABNORMAL LOW (ref 8.9–10.3)
Creatinine, Ser: 1.42 mg/dL — ABNORMAL HIGH (ref 0.61–1.24)
GFR calc Af Amer: 56 mL/min — ABNORMAL LOW (ref >60)
GFR, EST NON AFRICAN AMERICAN: 48 mL/min — AB (ref >60)
GLUCOSE: 139 mg/dL — AB (ref 65–99)
POTASSIUM: 3.9 mmol/L (ref 3.5–5.1)
Sodium: 134 mmol/L — ABNORMAL LOW (ref 135–145)

## 2017-11-22 LAB — GLUCOSE, CAPILLARY
GLUCOSE-CAPILLARY: 130 mg/dL — AB (ref 65–99)
Glucose-Capillary: 142 mg/dL — ABNORMAL HIGH (ref 65–99)

## 2017-11-22 MED ORDER — TRAMADOL HCL 50 MG PO TABS
50.0000 mg | ORAL_TABLET | Freq: Four times a day (QID) | ORAL | Status: DC | PRN
  Filled 2017-11-22: qty 1

## 2017-11-22 MED ORDER — SPIRONOLACTONE 25 MG PO TABS
25.0000 mg | ORAL_TABLET | Freq: Every day | ORAL | Status: DC
  Filled 2017-11-22: qty 1

## 2017-11-22 MED ORDER — ACETAMINOPHEN 325 MG PO TABS
650.0000 mg | ORAL_TABLET | Freq: Four times a day (QID) | ORAL | Status: DC | PRN
  Administered 2017-11-22: 650 mg via ORAL
  Filled 2017-11-22: qty 2

## 2017-11-22 MED ORDER — FUROSEMIDE 40 MG PO TABS
40.0000 mg | ORAL_TABLET | Freq: Two times a day (BID) | ORAL | Status: DC
  Administered 2017-11-23: 40 mg via ORAL
  Filled 2017-11-22: qty 1

## 2017-11-22 NOTE — Progress Notes (Signed)
[facility] PROGRESS NOTE    Progress Note  [name]  JME:[id number] DOB: [birth year] DOA: 11/20/2017 PCP: John Koch, MD     Brief Narrative:   [name] is an 72 y.o. male past medical history of coronary artery disease status post CABG, heart failure, diabetes mellitus type 2 hepatic cirrhosis with upper GI bleed who comes in complaining of left foot and ankle pain and swelling with erythema over the last 4 days.  Assessment/Plan:   Left lower ext Cellulitis Doppler no DVT. Swelling, erythema, tenderness improved. He relates the pain is better. Cont abx for 1 additional day  Anemia of chronic disease: Hemoglobin appears to be at baseline no signs of bleeding hemoglobin at 10.6.  Chronic kidney disease stage III: Creatinine at baseline.    Chronic diastolic heart failure: She appears to be euvolemic last echo was this year in February it showed an EF of 60% continue monitor strict I's and O's,. Hold lasix and aldactone for today.  Essential hypertension: Continue losartan and metoprolol.  Diabetes mellitus type 2: A1c of 6.5 hold metformin continue sliding scale.  Hepatic cirrhosis: Becoming hyponatremic hold lasix and aldactone for today.  DVT prophylaxis: lovenox Family Communication:none Disposition Plan/Barrier to D/C: home inam Code Status:     Code Status Orders  (From admission, onward)        Start     Ordered   11/20/17 1948  Full code  Continuous     11/20/17 1951    Code Status History    Date Active Date Inactive Code Status Order ID Comments User Context   09/29/2017 2212 10/05/2017 2026 Full Code [id number]  Norval Morton, MD ED   03/31/2014 1245 04/03/2014 1638 Full Code [id number]  John Giovanni, PA-C Inpatient   03/28/2014 0903 03/31/2014 1245 Full Code [id number]  Belva Crome, MD Inpatient   03/27/2014 1742 03/28/2014 0903 Full Code [id number]  Rogelia Mire, NP Inpatient   02/17/2014 0313 02/18/2014 1339 Full Code  [id number]  Elmarie Shiley, MD Inpatient   08/13/2012 2145 08/17/2012 1438 Full Code [id number]  Almyra Free, RN Inpatient    Advance Directive Documentation     Most Recent Value  Type of Advance Directive  Healthcare Power of [place], Living will  Pre-existing out of facility DNR order (yellow form or pink MOST form)  -  "MOST" Form in Place?  -        IV Access:    Peripheral IV   Procedures and diagnostic studies:   Dg Foot Complete Left  Result Date: 11/20/2017 CLINICAL DATA:  Left foot pain and swelling EXAM: LEFT FOOT - COMPLETE 3+ VIEW COMPARISON:  07/04/2009 FINDINGS: Negative for fracture. Mild degenerative change in the first MTP joint. Calcaneal spurring. Arterial calcification. Diffuse soft tissue swelling around the metatarsophalangeal joints. IMPRESSION: Soft tissue swelling.  No acute skeletal abnormality. Electronically Signed   By: [name] M.D.   On: 11/20/2017 16:53     Medical Consultants:    None.  Anti-Infectives:   IV clindamycin  Subjective:    [name] he relates his pain is resolved.  Objective:    Vitals:   11/21/17 2026 11/21/17 2232 11/22/17 0513 11/22/17 0733  BP: (!) 126/57 116/67 (!) 113/43   Pulse:  62 68   Resp: 17 18 16    Temp: 98.7 F (37.1 C)  97.7 F (36.5 C)   TempSrc: Oral  Oral   SpO2: 98% 95% 100% 99%  Weight:      Height:        Intake/Output Summary (Last 24 hours) at 11/22/2017 1032 Last data filed at 11/22/2017 0935 Gross per 24 hour  Intake 1300 ml  Output 2400 ml  Net -1100 ml   Filed Weights   11/20/17 1407 11/20/17 2136 11/21/17 1935  Weight: 110.7 kg (244 lb) 110.5 kg (243 lb 11.2 oz) 111.1 kg (244 lb 14.9 oz)    Exam: General exam: In no acute distress. Respiratory system: Good air movement and clear to auscultation. Cardiovascular system: S1 & S2 heard, RRR Gastrointestinal system: Abdomen is nondistended, soft and nontender.  Central nervous system: Alert and oriented. No  focal neurological deficits. Extremities: 3+ edema Skin: No ulcers or rashes, his left lower extremity  erythematous and swelling is improved and not tender to touch. Psychiatry: Judgement and insight appear normal. Mood & affect appropriate.    Data Reviewed:    Labs: Basic Metabolic Panel: Recent Labs  Lab 11/20/17 1735 11/21/17 0635 11/22/17 0543  NA 139 138 134*  K 3.8 4.0 3.9  CL 100* 99* 96*  CO2 28 29 26   GLUCOSE 124* 143* 139*  BUN 24* 27* 28*  CREATININE 1.23 1.28* 1.42*  CALCIUM 8.9 8.8* 8.7*   GFR Estimated Creatinine Clearance: 56.8 mL/min (A) (by C-G formula based on SCr of 1.42 mg/dL (H)). Liver Function Tests: No results for input(s): AST, ALT, ALKPHOS, BILITOT, PROT, ALBUMIN in the last 168 hours. No results for input(s): LIPASE, AMYLASE in the last 168 hours. No results for input(s): AMMONIA in the last 168 hours. Coagulation profile No results for input(s): INR, PROTIME in the last 168 hours.  CBC: Recent Labs  Lab 11/20/17 1735 11/21/17 0635  WBC 6.4 5.8  NEUTROABS 4.9  --   HGB 10.6* 10.1*  HCT 35.4* 33.3*  MCV 86.1 85.8  PLT 177 169   Cardiac Enzymes: No results for input(s): CKTOTAL, CKMB, CKMBINDEX, TROPONINI in the last 168 hours. BNP (last 3 results) No results for input(s): PROBNP in the last 8760 hours. CBG: Recent Labs  Lab 11/20/17 2249 11/21/17 0715 11/21/17 1131 11/22/17 0747  GLUCAP 154* 127* 128* 130*   D-Dimer: No results for input(s): DDIMER in the last 72 hours. Hgb A1c: No results for input(s): HGBA1C in the last 72 hours. Lipid Profile: No results for input(s): CHOL, HDL, LDLCALC, TRIG, CHOLHDL, LDLDIRECT in the last 72 hours. Thyroid function studies: No results for input(s): TSH, T4TOTAL, T3FREE, THYROIDAB in the last 72 hours.  Invalid input(s): FREET3 Anemia work up: No results for input(s): VITAMINB12, FOLATE, FERRITIN, TIBC, IRON, RETICCTPCT in the last 72 hours. Sepsis Labs: Recent Labs  Lab  11/20/17 1735 11/20/17 2106 11/21/17 0635  WBC 6.4  --  5.8  LATICACIDVEN  --  1.38  --    Microbiology Recent Results (from the past 240 hour(s))  Culture, blood (routine x 2)     Status: None (Preliminary result)   Collection Time: 11/20/17  5:35 PM  Result Value Ref Range Status   Specimen Description   Final    BLOOD LEFT ARM Performed at [facility], [place] [address] [place], [place] [address]    Special Requests   Final    BOTTLES DRAWN AEROBIC AND ANAEROBIC Blood Culture adequate volume Performed at [facility] [address], [place],  [address]    Culture   Final    NO GROWTH < 12 HOURS Performed at [facility], [address] [place], [place] [address]    Report Status PENDING  Incomplete  Culture, blood (routine x 2)     Status: None (Preliminary result)   Collection Time: 11/20/17  6:47 PM  Result Value Ref Range Status   Specimen Description   Final    BLOOD LEFT ARM Performed at [facility] [address] [place], [place] [address]    Special Requests   Final    BOTTLES DRAWN AEROBIC AND ANAEROBIC Blood Culture results may not be optimal due to an excessive volume of blood received in culture bottles   Culture   Final    NO GROWTH < 12 HOURS Performed at [facility] [address], [place], [place] [address]    Report Status PENDING  Incomplete     Medications:   . aspirin EC  81 mg Oral Daily  . atorvastatin  40 mg Oral q1800  . enoxaparin (LOVENOX) injection  40 mg Subcutaneous Q24H  . ferrous sulfate  325 mg Oral BID WC  . [START ON 11/23/2017] furosemide  40 mg Oral BID  . gabapentin  100 mg Oral BID  . insulin aspart  0-9 Units Subcutaneous TID WC  . metoprolol tartrate  25 mg Oral BID  . pantoprazole  40 mg Oral BID  . potassium chloride SA  20 mEq Oral Daily  . [START ON 11/23/2017] spironolactone  25 mg Oral Daily  . umeclidinium-vilanterol  1 puff Inhalation  Daily   Continuous Infusions: . clindamycin (CLEOCIN) IV 600 mg (11/22/17 1028)     LOS: 1 day   Charlynne Cousins  [facility] Pager [phone]  *Please refer to [email or web address], password [email or web address] to get updated schedule on who will round on this patient, as hospitalists switch teams weekly. If 7PM-7AM, please contact night-coverage at [email or web address], password [email or web address] for any overnight needs.  11/22/2017, 10:32 AM

## 2017-11-23 ENCOUNTER — Other Ambulatory Visit: Payer: Self-pay | Admitting: Pharmacist

## 2017-11-23 ENCOUNTER — Encounter: Payer: Self-pay | Admitting: Pharmacist

## 2017-11-23 LAB — BASIC METABOLIC PANEL
Anion gap: 10 (ref 5–15)
BUN: 32 mg/dL — AB (ref 6–20)
CALCIUM: 8.4 mg/dL — AB (ref 8.9–10.3)
CO2: 25 mmol/L (ref 22–32)
CREATININE: 1.37 mg/dL — AB (ref 0.61–1.24)
Chloride: 103 mmol/L (ref 101–111)
GFR calc Af Amer: 58 mL/min — ABNORMAL LOW (ref >60)
GFR, EST NON AFRICAN AMERICAN: 50 mL/min — AB (ref >60)
GLUCOSE: 138 mg/dL — AB (ref 65–99)
Potassium: 3.9 mmol/L (ref 3.5–5.1)
SODIUM: 138 mmol/L (ref 135–145)

## 2017-11-23 LAB — GLUCOSE, CAPILLARY: GLUCOSE-CAPILLARY: 142 mg/dL — AB (ref 65–99)

## 2017-11-23 MED ORDER — SPIRONOLACTONE 50 MG PO TABS: 50.0000 mg | ORAL_TABLET | Freq: Every day | ORAL | 3 refills | Status: DC

## 2017-11-23 MED ORDER — CLINDAMYCIN HCL 150 MG PO CAPS: 300.0000 mg | ORAL_CAPSULE | Freq: Four times a day (QID) | ORAL | 0 refills | Status: AC

## 2017-11-23 MED ORDER — SPIRONOLACTONE 25 MG PO TABS
50.0000 mg | ORAL_TABLET | Freq: Every day | ORAL | Status: DC
  Administered 2017-11-23: 50 mg via ORAL
  Filled 2017-11-23 (×2): qty 1
  Filled 2017-11-23: qty 2

## 2017-11-23 NOTE — Discharge Summary (Signed)
Physician Discharge Summary  [name] JEH:[id number] DOB: 05/10/1946 DOA: 11/20/2017  PCP: [name], MD  Admit date: 11/20/2017 Discharge date: 11/23/2017  Admitted From: home Disposition:  Home  Recommendations for Outpatient Follow-up:  1. Follow up with PCP in 1-2 weeks 2. Please obtain BMP/CBC in one week   Home Health:no Equipment/Devices:walker  Discharge Condition:stable CODE STATUS:full Diet recommendation: Heart Healthy   Brief/Interim Summary: You may copy/paste interim summary or write brief hospital course depending on length of stay  Discharge Diagnoses:  Principal Problem:   Cellulitis Active Problems:   Type 2 diabetes with complication ([place])   History of gout   Hyperlipidemia with target LDL less than 70   Hepatic cirrhosis (HCC)   S/P CABG x 3  Left lower ext Cellulitis Lower ext Doppler negative for DVT. Started empirically on abx, erythema and pain improved. Change to roal which he will cont for 5 days as an outpatient  Anemia of chronic disease: Hemoglobin appears to be at baseline no signs of bleeding hemoglobin at 10.6.  Chronic kidney disease stage III: Creatinine at baseline.    Chronic diastolic heart failure: She appears to be euvolemic last echo was this year in February it showed an EF of 60% continue monitor strict I's and O's,. Diuretics held on admission, remain euvolemic, resume as an outpatient.  Essential hypertension: Continue losartan and metoprolol.  Diabetes mellitus type 2: A1c of 6.5 hold metformin continue sliding scale.  Hepatic cirrhosis: COnt lasix increase aldactone as an outpatient. Recheck a b-met in 1 week.     Discharge Instructions  Discharge Instructions    AMB Referral to [facility]   Complete by:  As directed    Please assign HTA member to [facility] RNCM for DM and HF. [facility] Pharmacist for medication affordability. Written consent obtained. Referral received from [facility] UM.  Please contact son Marquinn Meschke at [phone] for post discharge calls. PCP office Velora Heckler at [place]) listed as doing their own toc. Currently at [facility]. Please call with questions. Marthenia Rolling, [place], RN,BSN-THN [facility] XAJOINO-[phone]   Reason for consult:  Please assign to [facility] Pharmacist   Diagnoses of:   Diabetes Heart Failure     Expected date of contact:  1-3 days (reserved for hospital discharges)   Diet - low sodium heart healthy   Complete by:  As directed    Increase activity slowly   Complete by:  As directed      Allergies as of 11/23/2017   No Known Allergies     Medication List    STOP taking these medications   ferrous sulfate 325 (65 FE) MG tablet   pantoprazole 40 MG tablet Commonly known as:  PROTONIX   potassium chloride SA 20 MEQ tablet Commonly known as:  K-DUR,KLOR-CON     TAKE these medications   aspirin EC 81 MG tablet Take 1 tablet (81 mg total) by mouth daily. Restart With Breakfast/Food on 10/18/17   atorvastatin 40 MG tablet Commonly known as:  LIPITOR TAKE 1 TABLET BY MOUTH DAILY AT 6 PM What changed:    how much to take  how to take this  when to take this  additional instructions   blood glucose meter kit and supplies Dispense based on patient and insurance preference. Use up to four times daily as directed. (FOR ICD-10 E10.9, E11.9).   clindamycin 150 MG capsule Commonly known as:  CLEOCIN Take 2 capsules (300 mg total) by  mouth 4 (four) times daily for 6 days.   esomeprazole 20 MG capsule Commonly known as:  NEXIUM Take 20 mg by mouth 2 (two) times daily.   furosemide 80 MG tablet Commonly known as:  LASIX Take 1 tablet (80 mg total) by mouth 2 (two) times daily.   gabapentin 100 MG capsule Commonly known as:  NEURONTIN TAKE 1 CAPSULE(100 MG) BY MOUTH TWICE DAILY   losartan 50 MG tablet Commonly known as:  COZAAR TAKE 1 TABLET (59m) BY MOUTH EVERY DAY   metFORMIN  850 MG tablet Commonly known as:  GLUCOPHAGE Take 850 mg by mouth 2 (two) times daily with a meal.   metoprolol tartrate 25 MG tablet Commonly known as:  LOPRESSOR Take 0.5 tablets (12.5 mg total) by mouth 2 (two) times daily. For Heart   nitroGLYCERIN 0.4 MG SL tablet Commonly known as:  NITROSTAT Place 1 tablet (0.4 mg total) under the tongue every 5 (five) minutes as needed for chest pain.   ondansetron 4 MG tablet Commonly known as:  ZOFRAN Take 1 tablet (4 mg total) by mouth every 6 (six) hours as needed for nausea.   ONETOUCH VERIO test strip Generic drug:  glucose blood USE TO TEST UP TO FOUR TIMES DAILY AS DIRECTED   polyethylene glycol packet Commonly known as:  MIRALAX / GLYCOLAX Take 17 g by mouth 2 (two) times daily.   spironolactone 50 MG tablet Commonly known as:  ALDACTONE Take 1 tablet (50 mg total) by mouth daily. What changed:    medication strength  how much to take   umeclidinium-vilanterol 62.5-25 MCG/INH Aepb Commonly known as:  ANORO ELLIPTA Inhale 1 puff into the lungs daily.       No Known Allergies  Consultations:  None   Procedures/Studies: Dg Foot Complete Left  Result Date: 11/20/2017 CLINICAL DATA:  Left foot pain and swelling EXAM: LEFT FOOT - COMPLETE 3+ VIEW COMPARISON:  07/04/2009 FINDINGS: Negative for fracture. Mild degenerative change in the first MTP joint. Calcaneal spurring. Arterial calcification. Diffuse soft tissue swelling around the metatarsophalangeal joints. IMPRESSION: Soft tissue swelling.  No acute skeletal abnormality. Electronically Signed   By: CFranchot GalloM.D.   On: 11/20/2017 16:53      Subjective: No compalins feels great, will like to go home.  Discharge Exam: Vitals:   11/22/17 2130 11/23/17 0619  BP: (!) 118/53 (!) 130/51  Pulse: (!) 53 (!) 56  Resp: 16 16  Temp: 98.1 F (36.7 C) 98.2 F (36.8 C)  SpO2: 100% 98%   Vitals:   11/22/17 1349 11/22/17 2130 11/23/17 0619 11/23/17 0700  BP:  (!) 113/52 (!) 118/53 (!) 130/51   Pulse: 68 (!) 53 (!) 56   Resp: _0 Temp: 97.7 F (36.5 C) 98.1 F (36.7 C) 98.2 F (36.8 C)   TempSrc: Oral Oral Oral   SpO2: 98% 100% 98%   Weight:    109.1 kg (240 lb 9.6 oz)  Height:        General: Pt is alert, awake, not in acute distress Cardiovascular: RRR, S1/S2 +, no rubs, no gallops Respiratory: CTA bilaterally, no wheezing, no rhonchi Abdominal: Soft, NT, ND, bowel sounds + Extremities: no edema, no cyanosis    The results of significant diagnostics from this hospitalization (including imaging, microbiology, ancillary and laboratory) are listed below for reference.     Microbiology: Recent Results (from the past 240 hour(s))  Culture, blood (routine x 2)     Status: None (Preliminary  result)   Collection Time: 11/20/17  5:35 PM  Result Value Ref Range Status   Specimen Description   Final    BLOOD LEFT ARM Performed at [facility] [address] [place], [place] [address]    Special Requests   Final    BOTTLES DRAWN AEROBIC AND ANAEROBIC Blood Culture adequate volume Performed at [facility] [address], [place], [place] [address]    Culture   Final    NO GROWTH 2 DAYS Performed at [facility] [address] [place], [place] [address]    Report Status PENDING  Incomplete  Culture, blood (routine x 2)     Status: None (Preliminary result)   Collection Time: 11/20/17  6:47 PM  Result Value Ref Range Status   Specimen Description   Final    BLOOD LEFT ARM Performed at [facility] [address], [place], [place] [address]    Special Requests   Final    BOTTLES DRAWN AEROBIC AND ANAEROBIC Blood Culture results may not be optimal due to an excessive volume of blood received in culture bottles   Culture   Final    NO GROWTH 2 DAYS Performed at [facility], [place] [address] [place], [place] [address]    Report Status PENDING   Incomplete     Labs: BNP (last 3 results) Recent Labs    09/02/17 1601 09/29/17 1819  BNP 197.2* 938.1*   Basic Metabolic Panel: Recent Labs  Lab 11/20/17 1735 11/21/17 0635 11/22/17 0543 11/23/17 0528  NA 139 138 134* 138  K 3.8 4.0 3.9 3.9  CL 100* 99* 96* 103  CO2 _0 GLUCOSE 124* 143* 139* 138*  BUN 24* 27* 28* 32*  CREATININE 1.23 1.28* 1.42* 1.37*  CALCIUM 8.9 8.8* 8.7* 8.4*   Liver Function Tests: No results for input(s): AST, ALT, ALKPHOS, BILITOT, PROT, ALBUMIN in the last 168 hours. No results for input(s): LIPASE, AMYLASE in the last 168 hours. No results for input(s): AMMONIA in the last 168 hours. CBC: Recent Labs  Lab 11/20/17 1735 11/21/17 0635  WBC 6.4 5.8  NEUTROABS 4.9  --   HGB 10.6* 10.1*  HCT 35.4* 33.3*  MCV 86.1 85.8  PLT 177 169   Cardiac Enzymes: No results for input(s): CKTOTAL, CKMB, CKMBINDEX, TROPONINI in the last 168 hours. BNP: Invalid input(s): POCBNP CBG: Recent Labs  Lab 11/21/17 0715 11/21/17 1131 11/22/17 0747 11/22/17 1142 11/23/17 0740  GLUCAP 127* 128* 130* 142* 142*   D-Dimer No results for input(s): DDIMER in the last 72 hours. Hgb A1c No results for input(s): HGBA1C in the last 72 hours. Lipid Profile No results for input(s): CHOL, HDL, LDLCALC, TRIG, CHOLHDL, LDLDIRECT in the last 72 hours. Thyroid function studies No results for input(s): TSH, T4TOTAL, T3FREE, THYROIDAB in the last 72 hours.  Invalid input(s): FREET3 Anemia work up No results for input(s): VITAMINB12, FOLATE, FERRITIN, TIBC, IRON, RETICCTPCT in the last 72 hours. Urinalysis    Component Value Date/Time   COLORURINE YELLOW 03/29/2014 1546   APPEARANCEUR CLEAR 03/29/2014 1546   LABSPEC 1.011 03/29/2014 1546   PHURINE 6.0 03/29/2014 1546   GLUCOSEU NEGATIVE 03/29/2014 1546   HGBUR NEGATIVE 03/29/2014 [place] 03/29/2014 1546   KETONESUR NEGATIVE 03/29/2014 1546   PROTEINUR NEGATIVE 03/29/2014 1546    UROBILINOGEN 0.2 03/29/2014 1546   NITRITE NEGATIVE 03/29/2014 1546   LEUKOCYTESUR NEGATIVE 03/29/2014 1546  Sepsis Labs Invalid input(s): PROCALCITONIN,  WBC,  LACTICIDVEN Microbiology Recent Results (from the past 240 hour(s))  Culture, blood (routine x 2)     Status: None (Preliminary result)   Collection Time: 11/20/17  5:35 PM  Result Value Ref Range Status   Specimen Description   Final    BLOOD LEFT ARM Performed at [facility], [place] [address], [place], [place] [address]    Special Requests   Final    BOTTLES DRAWN AEROBIC AND ANAEROBIC Blood Culture adequate volume Performed at [facility] [address] [place], [place] [address]    Culture   Final    NO GROWTH 2 DAYS Performed at [facility] [address] [place], [place] [address]    Report Status PENDING  Incomplete  Culture, blood (routine x 2)     Status: None (Preliminary result)   Collection Time: 11/20/17  6:47 PM  Result Value Ref Range Status   Specimen Description   Final    BLOOD LEFT ARM Performed at [facility] [address] [place], [place] [address]    Special Requests   Final    BOTTLES DRAWN AEROBIC AND ANAEROBIC Blood Culture results may not be optimal due to an excessive volume of blood received in culture bottles   Culture   Final    NO GROWTH 2 DAYS Performed at [facility], [place] [address] [place], [place] [address]    Report Status PENDING  Incomplete     Time coordinating discharge: Over 30 minutes  SIGNED:   Charlynne Cousins, MD  [facility] 11/23/2017, 8:32 AM Pager   If 7PM-7AM, please contact night-coverage [email or web address] Password [email or web address]

## 2017-11-23 NOTE — Progress Notes (Signed)
Pt was provided discharge instructions, including a review of the pt's overall plan of care and education. Any questions or concerns were addressed. Pt informed this nurse that he will be unable to fill prescriptions until 11/28/17. After speaking with the pt's son, the son indicated that he would be picking up and paying for the patient's prescriptions. The pt had no further questions or concerns.  11/23/2017 [place], RN

## 2017-11-23 NOTE — Consult Note (Signed)
   [facility] Inpatient Consult   11/23/2017  CANDELARIO STEPPE 1945/09/21 [id number]    [facility] follow up.   Spoke with inpatient RNCM. Mr. Mau slated for discharge today.   [facility] Care Management to follow.    Marthenia Rolling, MSN-Ed, RN,BSN [facility] Liaison [phone]

## 2017-11-23 NOTE — Patient Outreach (Signed)
[facility]) Care Management  [place]   11/23/2017  CRISS PALLONE 1946/08/18 [id number]  Subjective: Patient's son was called for post discharge medication review per referral. HIPAA identifiers were obtained. Patient is a 72 year old male with multiple medical conditions including but not limited to:  CAD, GERD, hyperlipidemia, OSA, morbid obesity, type 2 diabetes and history of GI bleed.  Patient's son manages his healthcare but the patient's son says his father manages his medications on his own.  Objective:  K-3.9 mmol/L 11/22/17 HgA1c-6.5% 12/18 Scr 1.37 11/22/17  Encounter Medications: Outpatient Encounter Medications as of 11/23/2017  Medication Sig Note  . aspirin EC 81 MG tablet Take 1 tablet (81 mg total) by mouth daily. Restart With Breakfast/Food on 10/18/17   . atorvastatin (LIPITOR) 40 MG tablet TAKE 1 TABLET BY MOUTH DAILY AT 6 PM (Patient taking differently: Take 40 mg by mouth daily at 6 PM. TAKE 1 TABLET BY MOUTH DAILY AT 6 PM)   . blood glucose meter kit and supplies Dispense based on patient and insurance preference. Use up to four times daily as directed. (FOR ICD-10 E10.9, E11.9).   . clindamycin (CLEOCIN) 150 MG capsule Take 2 capsules (300 mg total) by mouth 4 (four) times daily for 6 days.   Marland Kitchen esomeprazole (NEXIUM) 20 MG capsule Take 20 mg by mouth 2 (two) times daily.   . furosemide (LASIX) 80 MG tablet Take 1 tablet (80 mg total) by mouth 2 (two) times daily.   Marland Kitchen gabapentin (NEURONTIN) 100 MG capsule TAKE 1 CAPSULE(100 MG) BY MOUTH TWICE DAILY   . losartan (COZAAR) 50 MG tablet TAKE 1 TABLET (20m) BY MOUTH EVERY DAY   . metFORMIN (GLUCOPHAGE) 850 MG tablet Take 850 mg by mouth 2 (two) times daily with a meal.   . metoprolol tartrate (LOPRESSOR) 25 MG tablet Take 0.5 tablets (12.5 mg total) by mouth 2 (two) times daily. For Heart 11/20/2017: PATIENT TAKING 25 MG 2 TIMES DAILY  . ONETOUCH VERIO test strip USE TO TEST UP TO FOUR TIMES DAILY  AS DIRECTED   . spironolactone (ALDACTONE) 50 MG tablet Take 1 tablet (50 mg total) by mouth daily.   .Marland Kitchenumeclidinium-vilanterol (ANORO ELLIPTA) 62.5-25 MCG/INH AEPB Inhale 1 puff into the lungs daily.   . nitroGLYCERIN (NITROSTAT) 0.4 MG SL tablet Place 1 tablet (0.4 mg total) under the tongue every 5 (five) minutes as needed for chest pain.   .Marland Kitchenondansetron (ZOFRAN) 4 MG tablet Take 1 tablet (4 mg total) by mouth every 6 (six) hours as needed for nausea. (Patient not taking: Reported on 11/20/2017)   . polyethylene glycol (MIRALAX / GLYCOLAX) packet Take 17 g by mouth 2 (two) times daily. (Patient not taking: Reported on 11/20/2017)    No facility-administered encounter medications on file as of 11/23/2017.     Functional Status: In your present state of health, do you have any difficulty performing the following activities: 11/20/2017 09/30/2017  Hearing? N N  Vision? N N  Difficulty concentrating or making decisions? N N  Walking or climbing stairs? N N  Dressing or bathing? N N  Doing errands, shopping? Y -  Some recent data might be hidden    Fall/Depression Screening: Fall Risk  03/02/2017 01/05/2016 09/15/2015  Falls in the past year? No No No   PHQ 2/9 Scores 09/30/2017 01/05/2016 09/15/2015 09/11/2013  PHQ - 2 Score 0 0 0 0      Assessment: Patient's medications were reviewed with his son via  telephone.  ASSESSMENT: Date Discharged from Hospital: 11/23/2017 Date Medication Reconciliation Performed: 11/23/2017  Medications Discontinued at Discharge:   Ferrous Sulfate  Pantoprazole  Potassium Chloride  New Medications at Discharge: (delete if applicable)  Clindamycin 140m 2 caps four times daily  Medications Changed at Discharge: Spironolactone--now 575m1 tablet daily   Patient was recently discharged from hospital and all medications have been reviewed   Drugs sorted by  system:  Neurologic/Psychologic: Gabapentin  Cardiovascular: Aspirin Atorvastatin Furosemide Losartan Metoprolol Nitroglycerin Spironolactone   Gastrointestinal: Nexium  Ondansetron (does not take) Polyethylene Glycol  Endocrine: Metformin   Infectious Diseases: Clindamycin   Duplications in therapy: -none Gaps in therapy:  Medications to avoid in the elderly:  Drug interactions:  Other issues noted:   Metoprolol dose--patient's son reported he is taking 1 tablet twice daily vs 1 tablet daily as prescribed.  patient's son said patient is able to afford his medications.  Patient's son also reported going to the pharmacy and picking up the patient's medications.   PLAN: -Instructed patient's caregiver to take new medications as prescribed and discontinue old medications as prescribed.  -route note to patient's PCP  -close patient's pharmacy case. His son has my number and will call if they have any future questions or concerns.  KaElayne GuerinPharmD, BCAlbanylinical Pharmacist [phone]

## 2017-11-24 ENCOUNTER — Telehealth: Payer: Self-pay | Admitting: *Deleted

## 2017-11-24 ENCOUNTER — Encounter: Payer: Self-pay | Admitting: *Deleted

## 2017-11-24 LAB — GLUCOSE, CAPILLARY
GLUCOSE-CAPILLARY: 141 mg/dL — AB (ref 65–99)
Glucose-Capillary: 121 mg/dL — ABNORMAL HIGH (ref 65–99)
Glucose-Capillary: 163 mg/dL — ABNORMAL HIGH (ref 65–99)
Glucose-Capillary: 109 mg/dL — ABNORMAL HIGH (ref 65–99)
Glucose-Capillary: 143 mg/dL — ABNORMAL HIGH (ref 65–99)

## 2017-11-24 NOTE — Telephone Encounter (Signed)
Transition Care Management Follow-up Telephone Call   Date discharged? 11/23/17   How have you been since you were released from the hospital? Spoke w/pt son([name]) he states dad is doing ok. He's lying down now   Do you understand why you were in the hospital? YES   Do you understand the discharge instructions? YES   Where were you discharged to? Home   Items Reviewed:  Medications reviewed: YES  Allergies reviewed: YES  Dietary changes reviewed: YES, heart healthy  Referrals reviewed: YES, son states the  urse from [facility] contacted them yesterday to make sure he is taking his medications   Functional Questionnaire:   Activities of Daily Living (ADLs):   He states he are independent in the following: bathing and hygiene, feeding, continence, grooming and toileting States they require assistance with the following: ambulation and dressing   Any transportation issues/concerns?: NO   Any patient concerns? NO   Confirmed importance and date/time of follow-up visits scheduled YES, appt 12/04/17  Provider Appointment booked with Dr. Sharlet Salina   Confirmed with patient if condition begins to worsen call PCP or go to the ER.  Patient was given the office number and encouraged to call back with question or concerns.  : YES

## 2017-11-25 LAB — CULTURE, BLOOD (ROUTINE X 2)
CULTURE: NO GROWTH
Culture: NO GROWTH
Special Requests: ADEQUATE

## 2017-11-27 ENCOUNTER — Telehealth: Payer: Self-pay | Admitting: Internal Medicine

## 2017-11-27 ENCOUNTER — Other Ambulatory Visit: Payer: Self-pay | Admitting: *Deleted

## 2017-11-27 DIAGNOSIS — E1122 Type 2 diabetes mellitus with diabetic chronic kidney disease: Secondary | ICD-10-CM | POA: Diagnosis not present

## 2017-11-27 DIAGNOSIS — M47816 Spondylosis without myelopathy or radiculopathy, lumbar region: Secondary | ICD-10-CM | POA: Diagnosis not present

## 2017-11-27 DIAGNOSIS — M791 Myalgia, unspecified site: Secondary | ICD-10-CM | POA: Diagnosis not present

## 2017-11-27 DIAGNOSIS — N183 Chronic kidney disease, stage 3 (moderate): Secondary | ICD-10-CM | POA: Diagnosis not present

## 2017-11-27 DIAGNOSIS — M7732 Calcaneal spur, left foot: Secondary | ICD-10-CM | POA: Diagnosis not present

## 2017-11-27 DIAGNOSIS — K219 Gastro-esophageal reflux disease without esophagitis: Secondary | ICD-10-CM | POA: Diagnosis not present

## 2017-11-27 DIAGNOSIS — E785 Hyperlipidemia, unspecified: Secondary | ICD-10-CM | POA: Diagnosis not present

## 2017-11-27 DIAGNOSIS — I130 Hypertensive heart and chronic kidney disease with heart failure and stage 1 through stage 4 chronic kidney disease, or unspecified chronic kidney disease: Secondary | ICD-10-CM | POA: Diagnosis not present

## 2017-11-27 DIAGNOSIS — K7460 Unspecified cirrhosis of liver: Secondary | ICD-10-CM | POA: Diagnosis not present

## 2017-11-27 DIAGNOSIS — L03116 Cellulitis of left lower limb: Secondary | ICD-10-CM | POA: Diagnosis not present

## 2017-11-27 DIAGNOSIS — G8929 Other chronic pain: Secondary | ICD-10-CM | POA: Diagnosis not present

## 2017-11-27 DIAGNOSIS — J449 Chronic obstructive pulmonary disease, unspecified: Secondary | ICD-10-CM | POA: Diagnosis not present

## 2017-11-27 DIAGNOSIS — R634 Abnormal weight loss: Secondary | ICD-10-CM | POA: Diagnosis not present

## 2017-11-27 DIAGNOSIS — D631 Anemia in chronic kidney disease: Secondary | ICD-10-CM | POA: Diagnosis not present

## 2017-11-27 DIAGNOSIS — M109 Gout, unspecified: Secondary | ICD-10-CM | POA: Diagnosis not present

## 2017-11-27 DIAGNOSIS — D509 Iron deficiency anemia, unspecified: Secondary | ICD-10-CM | POA: Diagnosis not present

## 2017-11-27 DIAGNOSIS — Z7984 Long term (current) use of oral hypoglycemic drugs: Secondary | ICD-10-CM | POA: Diagnosis not present

## 2017-11-27 DIAGNOSIS — M19072 Primary osteoarthritis, left ankle and foot: Secondary | ICD-10-CM | POA: Diagnosis not present

## 2017-11-27 DIAGNOSIS — K269 Duodenal ulcer, unspecified as acute or chronic, without hemorrhage or perforation: Secondary | ICD-10-CM | POA: Diagnosis not present

## 2017-11-27 DIAGNOSIS — K3189 Other diseases of stomach and duodenum: Secondary | ICD-10-CM | POA: Diagnosis not present

## 2017-11-27 DIAGNOSIS — I25119 Atherosclerotic heart disease of native coronary artery with unspecified angina pectoris: Secondary | ICD-10-CM | POA: Diagnosis not present

## 2017-11-27 DIAGNOSIS — I70202 Unspecified atherosclerosis of native arteries of extremities, left leg: Secondary | ICD-10-CM | POA: Diagnosis not present

## 2017-11-27 DIAGNOSIS — G4733 Obstructive sleep apnea (adult) (pediatric): Secondary | ICD-10-CM | POA: Diagnosis not present

## 2017-11-27 DIAGNOSIS — Z7982 Long term (current) use of aspirin: Secondary | ICD-10-CM | POA: Diagnosis not present

## 2017-11-27 DIAGNOSIS — I5030 Unspecified diastolic (congestive) heart failure: Secondary | ICD-10-CM | POA: Diagnosis not present

## 2017-11-27 NOTE — Telephone Encounter (Signed)
Can be done at visit 

## 2017-11-27 NOTE — Telephone Encounter (Signed)
I have located [facility] and advised of dr crawfords note/instructions--

## 2017-11-27 NOTE — Patient Outreach (Addendum)
[facility]) Care Management  11/27/2017  DARRYON BASTIN 10/15/1945 [id number]    Telephone Assessment (initial attempt unsuccessful)  RN attempted outreach today and contacted the son Mccrae Speciale) as requested however not available and RN left a HIPAA approved voice message requesting a call back for pending [facility] services. Will scheduled another follow call from tomorrow.  [name], RN Care Management Coordinator [place] Office [phone]

## 2017-11-27 NOTE — Telephone Encounter (Signed)
Copied from [place] [id number]. Topic: Quick Communication - See Telephone Encounter >> Nov 27, 2017 11:41 AM Boyd Kerbs wrote: CRM for notification.   [facility], pt was release from Hospital  on 3/28 and they asked for blood work to be done.  She is asking if she should draw blood today or when he comes in on 4/8 appt     See Telephone encounter for: 11/27/17.

## 2017-11-28 ENCOUNTER — Other Ambulatory Visit: Payer: Self-pay | Admitting: *Deleted

## 2017-11-28 NOTE — Patient Outreach (Addendum)
[facility]) Care Management  11/28/2017  [name] 11/28/1945 [id number]    Telephone Assessment (2nd attempt)  RN attempted outreach call  however unsuccessful but able to leave a HIPAA approved voice message requesting a call back to the son's # Livia Snellen) as requested. Will send outreach letter and allow pt time to respond prior to a 3rd call to pt.  [name], RN Care Management Coordinator [place] Office [phone]

## 2017-12-04 ENCOUNTER — Other Ambulatory Visit (INDEPENDENT_AMBULATORY_CARE_PROVIDER_SITE_OTHER): Payer: PPO

## 2017-12-04 ENCOUNTER — Ambulatory Visit (INDEPENDENT_AMBULATORY_CARE_PROVIDER_SITE_OTHER): Payer: PPO | Admitting: Internal Medicine

## 2017-12-04 ENCOUNTER — Other Ambulatory Visit: Payer: Self-pay | Admitting: *Deleted

## 2017-12-04 ENCOUNTER — Encounter: Payer: Self-pay | Admitting: Internal Medicine

## 2017-12-04 VITALS — BP 118/66 | HR 71 | Temp 98.4°F | Ht 67.0 in | Wt 240.0 lb

## 2017-12-04 DIAGNOSIS — N179 Acute kidney failure, unspecified: Secondary | ICD-10-CM

## 2017-12-04 DIAGNOSIS — I110 Hypertensive heart disease with heart failure: Secondary | ICD-10-CM

## 2017-12-04 DIAGNOSIS — E785 Hyperlipidemia, unspecified: Secondary | ICD-10-CM

## 2017-12-04 DIAGNOSIS — L03116 Cellulitis of left lower limb: Secondary | ICD-10-CM | POA: Diagnosis not present

## 2017-12-04 DIAGNOSIS — E118 Type 2 diabetes mellitus with unspecified complications: Secondary | ICD-10-CM | POA: Diagnosis not present

## 2017-12-04 DIAGNOSIS — K7460 Unspecified cirrhosis of liver: Secondary | ICD-10-CM

## 2017-12-04 LAB — CBC
Hemoglobin: 8.3 g/dL — ABNORMAL LOW (ref 13.0–17.0)
MCHC: 32.2 g/dL (ref 30.0–36.0)
MCV: 80.7 fl (ref 78.0–100.0)
Platelets: 208 10*3/uL (ref 150.0–400.0)
RBC: 3.17 Mil/uL — ABNORMAL LOW (ref 4.22–5.81)
RDW: 17.2 % — AB (ref 11.5–15.5)
WBC: 7 10*3/uL (ref 4.0–10.5)

## 2017-12-04 LAB — COMPREHENSIVE METABOLIC PANEL
ALT: 16 U/L (ref 0–53)
AST: 23 U/L (ref 0–37)
Albumin: 4 g/dL (ref 3.5–5.2)
Alkaline Phosphatase: 109 U/L (ref 39–117)
BUN: 37 mg/dL — AB (ref 6–23)
CHLORIDE: 102 meq/L (ref 96–112)
CO2: 26 mEq/L (ref 19–32)
Calcium: 8.8 mg/dL (ref 8.4–10.5)
Creatinine, Ser: 1.35 mg/dL (ref 0.40–1.50)
GFR: 55.26 mL/min — ABNORMAL LOW (ref >60.00)
GLUCOSE: 172 mg/dL — AB (ref 70–99)
Potassium: 4.8 mEq/L (ref 3.5–5.1)
SODIUM: 136 meq/L (ref 135–145)
Total Bilirubin: 0.4 mg/dL (ref 0.2–1.2)
Total Protein: 7.4 g/dL (ref 6.0–8.3)

## 2017-12-04 MED ORDER — LOSARTAN POTASSIUM 50 MG PO TABS: ORAL_TABLET | ORAL | 3 refills | Status: DC

## 2017-12-04 MED ORDER — ATORVASTATIN CALCIUM 40 MG PO TABS: ORAL_TABLET | ORAL | 3 refills | Status: DC

## 2017-12-04 MED ORDER — DICLOFENAC SODIUM 1 % TD GEL: 2.0000 g | Freq: Four times a day (QID) | TRANSDERMAL | 3 refills | Status: DC

## 2017-12-04 NOTE — Progress Notes (Signed)
   Subjective:    Patient ID: [name], male    DOB: 1946-05-18, 72 y.o.   MRN: [id number]  HPI The patient is a 72 YO man coming in for hospital follow up (admitted for cellulitis and swelling in his feet with diuresis and antibiotics, discharged with antibiotics). He is doing well since discharge. He was taken off potassium and wants to know if that it okay. He also has some confusion about medications and is out of several and wants to know if he should continue. The redness and pain in feet is gone now. He does still have some swelling. Doing much better with low salt foods but did slip up some 1-2 days ago. Denies chest pains or SOB. He denies abdominal pain, constipation or diarrhea.   PMH, [facility], social history reviewed and updated.   Review of Systems  Constitutional: Positive for activity change and fatigue. Negative for appetite change, chills and diaphoresis.  HENT: Negative.   Eyes: Negative.   Respiratory: Negative for cough, chest tightness and shortness of breath.   Cardiovascular: Positive for leg swelling. Negative for chest pain and palpitations.  Gastrointestinal: Negative for abdominal distention, abdominal pain, constipation, diarrhea, nausea and vomiting.  Musculoskeletal: Negative.   Skin: Positive for color change.  Neurological: Negative.   Psychiatric/Behavioral: Negative.       Objective:   Physical Exam  Constitutional: He is oriented to person, place, and time. He appears well-developed and well-nourished.  overweight  HENT:  Head: Normocephalic and atraumatic.  Eyes: EOM are normal.  Neck: Normal range of motion.  Cardiovascular: Normal rate and regular rhythm.  Pulmonary/Chest: Effort normal and breath sounds normal. No respiratory distress. He has no wheezes. He has no rales.  Abdominal: Soft. Bowel sounds are normal. He exhibits no distension. There is no tenderness. There is no rebound.  Musculoskeletal: He exhibits edema.  1-2+ edema to mid shins  with stasis color changes, no cellulitis and swelling improved from prior visit.   Neurological: He is alert and oriented to person, place, and time. Coordination normal.  Skin: Skin is warm and dry.  Psychiatric: He has a normal mood and affect.   Vitals:   12/04/17 1411  BP: 118/66  Pulse: 71  Temp: 98.4 F (36.9 C)  TempSrc: Oral  SpO2: 95%  Weight: 240 lb (108.9 kg)  Height: 5\' 7"  (1.702 m)      Assessment & Plan:

## 2017-12-04 NOTE — Patient Instructions (Signed)
We will check the blood work today and let you know if you need the potassium pills.

## 2017-12-04 NOTE — Patient Outreach (Signed)
[facility]) Care Management  12/04/2017  [name] [birth year] [id number]    Telephone Assessment Unsuccessful.  RN 3rd attempt unsuccessful for [facility] community case management services. RN able to leave a HIPAA approved voice message requesting a call back for pending services. RN also contacted Denyse Amass ([place]) who has discharged pt via pharmacy services with no needs. RN will allow time for return call however will closed over the next week and alert provider of pt's disposition with [facility] services.   [name], RN Care Management Coordinator [place] Office [phone]

## 2017-12-05 ENCOUNTER — Encounter: Payer: Self-pay | Admitting: *Deleted

## 2017-12-06 ENCOUNTER — Other Ambulatory Visit: Payer: Self-pay | Admitting: *Deleted

## 2017-12-06 ENCOUNTER — Encounter: Payer: Self-pay | Admitting: *Deleted

## 2017-12-06 NOTE — Assessment & Plan Note (Signed)
Some adjustment of diuretics in the hospital so checking CMP today and adjust as needed. Restart potassium if needed.

## 2017-12-06 NOTE — Assessment & Plan Note (Signed)
Has been out of atorvastatin for some time and refilled and encouraged to resume today.

## 2017-12-06 NOTE — Assessment & Plan Note (Signed)
Checking CMP for stability.  

## 2017-12-06 NOTE — Assessment & Plan Note (Signed)
Resolved and swelling is still down from hospital.

## 2017-12-06 NOTE — Patient Outreach (Signed)
[facility]) Care Management  12/06/2017  [name] 1945/09/13 [id number]    Telephone Assessment  RN received a call back from pt and identifiers were confirmed. RN introduced the [facility] program, services and purpose for today's call. Discussed pt's recent hospitalization and inquired if pt has visited his primary provider since his discharge. Also discussed pt's cellulitis as pt states "it's much better with the medication provided". RN also inquired on his DM and HF as pt reports his CBG this morning was 131 and weight was 223. Pt states he completes these task daily and manages these conditions fairly well with no acute issues. RN offered a one-on-one for further management of these medical condition however pt opt to decline at this time indicated he is doing well but very appreciative. RN has provider pt with a contact name and number if services via [facility] are ever needed if the future. Pt opt to decline review of his medications indicating his provider's office has review this information already. No other inquires or request at this time. Will alert primary of pt's disposition with [facility] services and deactivate this case.  [name], RN Care Management Coordinator [place] Office [phone]

## 2017-12-11 ENCOUNTER — Ambulatory Visit: Payer: Self-pay | Admitting: *Deleted

## 2017-12-18 ENCOUNTER — Emergency Department (HOSPITAL_COMMUNITY): Payer: PPO

## 2017-12-18 ENCOUNTER — Encounter (HOSPITAL_COMMUNITY): Payer: Self-pay | Admitting: Emergency Medicine

## 2017-12-18 ENCOUNTER — Inpatient Hospital Stay (HOSPITAL_COMMUNITY)
Admission: EM | Admit: 2017-12-18 | Discharge: 2017-12-22 | DRG: 378 | Disposition: A | Discharge status: Home Health | Payer: PPO | Attending: Internal Medicine | Admitting: Internal Medicine

## 2017-12-18 ENCOUNTER — Other Ambulatory Visit: Payer: Self-pay

## 2017-12-18 DIAGNOSIS — K7460 Unspecified cirrhosis of liver: Secondary | ICD-10-CM

## 2017-12-18 DIAGNOSIS — N183 Chronic kidney disease, stage 3 (moderate): Secondary | ICD-10-CM | POA: Diagnosis present

## 2017-12-18 DIAGNOSIS — I25810 Atherosclerosis of coronary artery bypass graft(s) without angina pectoris: Secondary | ICD-10-CM

## 2017-12-18 DIAGNOSIS — K5710 Diverticulosis of small intestine without perforation or abscess without bleeding: Secondary | ICD-10-CM | POA: Diagnosis not present

## 2017-12-18 DIAGNOSIS — R1013 Epigastric pain: Secondary | ICD-10-CM | POA: Diagnosis not present

## 2017-12-18 DIAGNOSIS — R0602 Shortness of breath: Secondary | ICD-10-CM | POA: Diagnosis not present

## 2017-12-18 DIAGNOSIS — J45909 Unspecified asthma, uncomplicated: Secondary | ICD-10-CM | POA: Diagnosis present

## 2017-12-18 DIAGNOSIS — D500 Iron deficiency anemia secondary to blood loss (chronic): Secondary | ICD-10-CM | POA: Diagnosis present

## 2017-12-18 DIAGNOSIS — I509 Heart failure, unspecified: Secondary | ICD-10-CM | POA: Diagnosis not present

## 2017-12-18 DIAGNOSIS — K760 Fatty (change of) liver, not elsewhere classified: Secondary | ICD-10-CM | POA: Diagnosis present

## 2017-12-18 DIAGNOSIS — D62 Acute posthemorrhagic anemia: Secondary | ICD-10-CM | POA: Diagnosis present

## 2017-12-18 DIAGNOSIS — K920 Hematemesis: Secondary | ICD-10-CM | POA: Diagnosis not present

## 2017-12-18 DIAGNOSIS — Z7982 Long term (current) use of aspirin: Secondary | ICD-10-CM | POA: Diagnosis not present

## 2017-12-18 DIAGNOSIS — Q2733 Arteriovenous malformation of digestive system vessel: Secondary | ICD-10-CM | POA: Diagnosis not present

## 2017-12-18 DIAGNOSIS — I130 Hypertensive heart and chronic kidney disease with heart failure and stage 1 through stage 4 chronic kidney disease, or unspecified chronic kidney disease: Secondary | ICD-10-CM | POA: Diagnosis not present

## 2017-12-18 DIAGNOSIS — K219 Gastro-esophageal reflux disease without esophagitis: Secondary | ICD-10-CM | POA: Diagnosis present

## 2017-12-18 DIAGNOSIS — E1122 Type 2 diabetes mellitus with diabetic chronic kidney disease: Secondary | ICD-10-CM | POA: Diagnosis present

## 2017-12-18 DIAGNOSIS — E785 Hyperlipidemia, unspecified: Secondary | ICD-10-CM | POA: Diagnosis not present

## 2017-12-18 DIAGNOSIS — E118 Type 2 diabetes mellitus with unspecified complications: Secondary | ICD-10-CM

## 2017-12-18 DIAGNOSIS — Z79899 Other long term (current) drug therapy: Secondary | ICD-10-CM

## 2017-12-18 DIAGNOSIS — Z951 Presence of aortocoronary bypass graft: Secondary | ICD-10-CM | POA: Diagnosis not present

## 2017-12-18 DIAGNOSIS — Z87891 Personal history of nicotine dependence: Secondary | ICD-10-CM | POA: Diagnosis not present

## 2017-12-18 DIAGNOSIS — R079 Chest pain, unspecified: Secondary | ICD-10-CM | POA: Diagnosis not present

## 2017-12-18 DIAGNOSIS — G4733 Obstructive sleep apnea (adult) (pediatric): Secondary | ICD-10-CM | POA: Diagnosis present

## 2017-12-18 DIAGNOSIS — D649 Anemia, unspecified: Secondary | ICD-10-CM | POA: Diagnosis not present

## 2017-12-18 DIAGNOSIS — M109 Gout, unspecified: Secondary | ICD-10-CM | POA: Diagnosis not present

## 2017-12-18 DIAGNOSIS — Z955 Presence of coronary angioplasty implant and graft: Secondary | ICD-10-CM

## 2017-12-18 DIAGNOSIS — K264 Chronic or unspecified duodenal ulcer with hemorrhage: Secondary | ICD-10-CM | POA: Diagnosis not present

## 2017-12-18 DIAGNOSIS — K31811 Angiodysplasia of stomach and duodenum with bleeding: Secondary | ICD-10-CM | POA: Diagnosis not present

## 2017-12-18 DIAGNOSIS — K922 Gastrointestinal hemorrhage, unspecified: Secondary | ICD-10-CM | POA: Diagnosis not present

## 2017-12-18 DIAGNOSIS — K31819 Angiodysplasia of stomach and duodenum without bleeding: Secondary | ICD-10-CM | POA: Diagnosis not present

## 2017-12-18 DIAGNOSIS — K279 Peptic ulcer, site unspecified, unspecified as acute or chronic, without hemorrhage or perforation: Secondary | ICD-10-CM | POA: Diagnosis not present

## 2017-12-18 DIAGNOSIS — Z7984 Long term (current) use of oral hypoglycemic drugs: Secondary | ICD-10-CM | POA: Diagnosis not present

## 2017-12-18 DIAGNOSIS — I2510 Atherosclerotic heart disease of native coronary artery without angina pectoris: Secondary | ICD-10-CM | POA: Diagnosis not present

## 2017-12-18 LAB — COMPREHENSIVE METABOLIC PANEL
ALT: 14 U/L — ABNORMAL LOW (ref 17–63)
ANION GAP: 10 (ref 5–15)
AST: 21 U/L (ref 15–41)
Albumin: 3.6 g/dL (ref 3.5–5.0)
Alkaline Phosphatase: 85 U/L (ref 38–126)
BUN: 36 mg/dL — ABNORMAL HIGH (ref 6–20)
CHLORIDE: 108 mmol/L (ref 101–111)
CO2: 19 mmol/L — AB (ref 22–32)
Calcium: 8.4 mg/dL — ABNORMAL LOW (ref 8.9–10.3)
Creatinine, Ser: 1.45 mg/dL — ABNORMAL HIGH (ref 0.61–1.24)
GFR calc Af Amer: 54 mL/min — ABNORMAL LOW (ref >60)
GFR calc non Af Amer: 47 mL/min — ABNORMAL LOW (ref >60)
Glucose, Bld: 136 mg/dL — ABNORMAL HIGH (ref 65–99)
Potassium: 4.8 mmol/L (ref 3.5–5.1)
Sodium: 137 mmol/L (ref 135–145)
Total Bilirubin: 0.5 mg/dL (ref 0.3–1.2)
Total Protein: 6.7 g/dL (ref 6.5–8.1)

## 2017-12-18 LAB — CBC
HCT: 21.2 % — ABNORMAL LOW (ref 39.0–52.0)
HEMOGLOBIN: 6.3 g/dL — AB (ref 13.0–17.0)
MCH: 24.6 pg — AB (ref 26.0–34.0)
MCHC: 29.7 g/dL — ABNORMAL LOW (ref 30.0–36.0)
MCV: 82.8 fL (ref 78.0–100.0)
Platelets: 162 10*3/uL (ref 150–400)
RBC: 2.56 MIL/uL — ABNORMAL LOW (ref 4.22–5.81)
RDW: 16.7 % — ABNORMAL HIGH (ref 11.5–15.5)
WBC: 4.8 10*3/uL (ref 4.0–10.5)

## 2017-12-18 LAB — I-STAT TROPONIN, ED: TROPONIN I, POC: 0.02 ng/mL (ref 0.00–0.08)

## 2017-12-18 LAB — PROTIME-INR
INR: 1.08
Prothrombin Time: 14 seconds (ref 11.4–15.2)

## 2017-12-18 LAB — PREPARE RBC (CROSSMATCH)

## 2017-12-18 MED ORDER — PANTOPRAZOLE SODIUM 40 MG IV SOLR
40.0000 mg | Freq: Once | INTRAVENOUS | Status: AC
  Administered 2017-12-18: 40 mg via INTRAVENOUS
  Filled 2017-12-18: qty 40

## 2017-12-18 MED ORDER — PANTOPRAZOLE SODIUM 40 MG IV SOLR: 40.0000 mg | Freq: Two times a day (BID) | INTRAVENOUS | Status: DC

## 2017-12-18 MED ORDER — LOSARTAN POTASSIUM 50 MG PO TABS
50.0000 mg | ORAL_TABLET | Freq: Every day | ORAL | Status: DC
  Administered 2017-12-19 – 2017-12-22 (×4): 50 mg via ORAL
  Filled 2017-12-18 (×5): qty 1

## 2017-12-18 MED ORDER — GABAPENTIN 100 MG PO CAPS
100.0000 mg | ORAL_CAPSULE | Freq: Two times a day (BID) | ORAL | Status: DC
  Administered 2017-12-19 – 2017-12-22 (×7): 100 mg via ORAL
  Filled 2017-12-18 (×7): qty 1

## 2017-12-18 MED ORDER — SODIUM CHLORIDE 0.9 % IV SOLN
1.0000 g | Freq: Once | INTRAVENOUS | Status: AC
  Administered 2017-12-18: 1 g via INTRAVENOUS
  Filled 2017-12-18: qty 10

## 2017-12-18 MED ORDER — OCTREOTIDE LOAD VIA INFUSION: 50.0000 ug | Freq: Once | INTRAVENOUS | Status: DC

## 2017-12-18 MED ORDER — OCTREOTIDE ACETATE 500 MCG/ML IJ SOLN: 50.0000 ug/h | INTRAMUSCULAR | Status: DC

## 2017-12-18 MED ORDER — ATORVASTATIN CALCIUM 40 MG PO TABS
40.0000 mg | ORAL_TABLET | Freq: Every day | ORAL | Status: DC
  Administered 2017-12-19 – 2017-12-21 (×4): 40 mg via ORAL
  Filled 2017-12-18 (×4): qty 1

## 2017-12-18 MED ORDER — INSULIN ASPART 100 UNIT/ML ~~LOC~~ SOLN
0.0000 [IU] | SUBCUTANEOUS | Status: DC
Start: 2017-12-19 — End: 2017-12-22
  Administered 2017-12-19: 1 [IU] via SUBCUTANEOUS
  Administered 2017-12-19: 2 [IU] via SUBCUTANEOUS
  Administered 2017-12-20 – 2017-12-21 (×6): 1 [IU] via SUBCUTANEOUS
  Administered 2017-12-21: 2 [IU] via SUBCUTANEOUS
  Administered 2017-12-21: 1 [IU] via SUBCUTANEOUS
  Filled 2017-12-18 (×3): qty 1

## 2017-12-18 MED ORDER — METOPROLOL TARTRATE 12.5 MG HALF TABLET
12.5000 mg | ORAL_TABLET | Freq: Two times a day (BID) | ORAL | Status: DC
  Administered 2017-12-19 – 2017-12-21 (×4): 12.5 mg via ORAL
  Filled 2017-12-18 (×5): qty 1

## 2017-12-18 MED ORDER — ONDANSETRON HCL 4 MG/2ML IJ SOLN: 4.0000 mg | Freq: Four times a day (QID) | INTRAMUSCULAR | Status: DC | PRN

## 2017-12-18 MED ORDER — ACETAMINOPHEN 650 MG RE SUPP: 650.0000 mg | Freq: Four times a day (QID) | RECTAL | Status: DC | PRN

## 2017-12-18 MED ORDER — PANTOPRAZOLE SODIUM 40 MG IV SOLR: 40.0000 mg | Freq: Once | INTRAVENOUS | Status: DC

## 2017-12-18 MED ORDER — PANTOPRAZOLE SODIUM 40 MG IV SOLR
8.0000 mg/h | INTRAVENOUS | Status: DC
  Administered 2017-12-18 – 2017-12-19 (×3): 8 mg/h via INTRAVENOUS
  Filled 2017-12-18 (×8): qty 80

## 2017-12-18 MED ORDER — ACETAMINOPHEN 325 MG PO TABS: 650.0000 mg | ORAL_TABLET | Freq: Four times a day (QID) | ORAL | Status: DC | PRN

## 2017-12-18 MED ORDER — UMECLIDINIUM-VILANTEROL 62.5-25 MCG/INH IN AEPB
1.0000 | INHALATION_SPRAY | Freq: Every day | RESPIRATORY_TRACT | Status: DC
  Administered 2017-12-19 – 2017-12-22 (×3): 1 via RESPIRATORY_TRACT
  Filled 2017-12-18: qty 14

## 2017-12-18 MED ORDER — SODIUM CHLORIDE 0.9 % IV SOLN
2.0000 g | INTRAVENOUS | Status: DC
  Filled 2017-12-18: qty 20

## 2017-12-18 MED ORDER — ONDANSETRON HCL 4 MG PO TABS: 4.0000 mg | ORAL_TABLET | Freq: Four times a day (QID) | ORAL | Status: DC | PRN

## 2017-12-18 NOTE — ED Notes (Signed)
Pt stands and bears weight on his own ability for orth VS in lobby. Pt reports no light-headed feelings. Appears strong and steady.

## 2017-12-18 NOTE — ED Triage Notes (Signed)
Pt c/o shortness of breath, intermittent chest pain and dark stools x 2 days. Pt reports taking a blood thinner.

## 2017-12-18 NOTE — ED Provider Notes (Signed)
[facility] EMERGENCY DEPARTMENT Provider Note   CSN: [id number] Arrival date & time: 12/18/17  1458     History   Chief Complaint Chief Complaint  Patient presents with  . Shortness of Breath    HPI [name] is a 72 y.o. male.  The history is provided by the patient.  Shortness of Breath  This is a new problem. The problem occurs intermittently.The current episode started 2 days ago. The problem has been gradually worsening. Associated symptoms include abdominal pain (epigastric) and leg swelling. Pertinent negatives include no fever, no headaches, no coryza, no rhinorrhea, no sore throat, no swollen glands, no ear pain, no neck pain, no cough, no sputum production, no hemoptysis, no wheezing, no PND, no orthopnea, no chest pain, no syncope, no vomiting, no rash and no leg pain. Precipitated by: Hx of ulcers and states some darker stools than usual.  He has tried nothing for the symptoms. He has had prior hospitalizations. Associated medical issues comments: hepatic cirrhosis.    Past Medical History:  Diagnosis Date  . Arthritis    "joints tighten up sometimes" (03/27/2104)  . Asthma   . CHF (congestive heart failure) ([place])   . Chronic low back pain   . Chronic lower back pain   . Coronary artery disease    a. 05/2012 Cath/PCI: LM 40ost, 50-60d, LAD 99p ruptured plaque (3.0x28 DES), LCX 50p/m, RCA 30-40p, 64m EF 65-70%.  . Family history of adverse reaction to anesthesia    Pt granddaughter had PONV  . GERD (gastroesophageal reflux disease)   . Hepatic cirrhosis ([place]    a. Dx 01/2014 - CT a/p   . History of blood transfusion    "related to bleeding ulcers"  . History of concussion    1976--  NO RESIDUAL  . History of GI bleed    a. UGIB 07/2012;  b. 01/2014 admission with GIB/FOB stool req 1U prbc's->EGD showed portal gastropathy, barrett's esoph, and chronic active h. pylori gastritis.  .Marland KitchenHistory of gout    2007 &  2008  LEFT LEG-- NO ISSUE SINCE   . Hyperlipidemia   . Iron deficiency anemia   . Kidney stones   . Lipoma   . OA (osteoarthritis of spine)    LOWER BACK--  INTERMITTANT LEFT LEG NUMBNESS  . OSA (obstructive sleep apnea)    PULMOLOGIST-  DR CLANCE--  MODERATE OSA  STARTED CPAP 2012--  BUT CURRENTLY HAS NOT USED PAST 6 MONTHS  . Phimosis    a. s/p circumcision 2015.  . Type 2 diabetes mellitus ([place]   . Unspecified essential hypertension     Patient Active Problem List   Diagnosis Date Noted  . Cellulitis 11/20/2017  . AKI (acute kidney injury) ([place] 10/24/2017  . GI bleed 09/29/2017  . SOB (shortness of breath) 09/21/2017  . Morbid obesity ([place] 02/05/2016  . Numerous moles 09/15/2015  . S/P CABG x 3 03/31/2014  . Hepatic cirrhosis ([place] 02/27/2014  . Hyperlipidemia with target LDL less than 70 06/29/2012  . Coronary artery disease    . OSA (obstructive sleep apnea)   . Type 2 diabetes with complication (HC[place] 181/15/7262 . GERD 08/04/2010  . History of gout   . Hypertensive heart disease     Past Surgical History:  Procedure Laterality Date  . ANKLE FRACTURE SURGERY Right 1989   "plate put in"  . APPENDECTOMY  05-16-2004   open  . CIRCUMCISION N/A 09/09/2013   Procedure: CIRCUMCISION ADULT;  Surgeon: Bernestine Amass, MD;  Location: [facility];  Service: Urology;  Laterality: N/A;  . COLECTOMY  05-16-2004  . CORONARY ANGIOPLASTY WITH STENT PLACEMENT  06/28/2012  DR COOPER   PCI W/  X1 DES to [place]. LAD/  LM  40% OSTIAL & 50-60% DISTAL /  50% PROX LCX/  30-40% PROX RCA & 50% MID RCA/   LVEF 65-70%  . CORONARY ARTERY BYPASS GRAFT N/A 03/31/2014   Procedure: CORONARY ARTERY BYPASS GRAFTING (CABG) times 3 using left internal mammary artery and right saphenous vein.;  Surgeon: Melrose Nakayama, MD;  Location: [place];  Service: Open Heart Surgery;  Laterality: N/A;  . DOBUTAMINE STRESS ECHO  06-08-2012   MODERATE HYPOKINESIS/ ISCHEMIA MID INFERIOR WALL  . ESOPHAGOGASTRODUODENOSCOPY  08/15/2012    Procedure: ESOPHAGOGASTRODUODENOSCOPY (EGD);  Surgeon: Wonda Horner, MD;  Location: [facility] ENDOSCOPY;  Service: Endoscopy;  Laterality: N/A;  . ESOPHAGOGASTRODUODENOSCOPY N/A 02/17/2014   Procedure: ESOPHAGOGASTRODUODENOSCOPY (EGD);  Surgeon: Jeryl Columbia, MD;  Location: [facility] ENDOSCOPY;  Service: Endoscopy;  Laterality: N/A;  . ESOPHAGOGASTRODUODENOSCOPY (EGD) WITH PROPOFOL N/A 09/30/2017   Procedure: ESOPHAGOGASTRODUODENOSCOPY (EGD) WITH PROPOFOL;  Surgeon: Wonda Horner, MD;  Location: [facility] ENDOSCOPY;  Service: Endoscopy;  Laterality: N/A;  . HERNIA REPAIR    . INTRAOPERATIVE TRANSESOPHAGEAL ECHOCARDIOGRAM N/A 03/31/2014   Procedure: INTRAOPERATIVE TRANSESOPHAGEAL ECHOCARDIOGRAM;  Surgeon: Melrose Nakayama, MD;  Location: [place];  Service: Open Heart Surgery;  Laterality: N/A;  . LAPAROSCOPIC UMBILICAL HERNIA REPAIR W/ MESH  06-06-2011  . LEFT HEART CATHETERIZATION WITH CORONARY ANGIOGRAM N/A 03/28/2014   Procedure: LEFT HEART CATHETERIZATION WITH CORONARY ANGIOGRAM;  Surgeon: Sinclair Grooms, MD;  Location: [facility] CATH LAB;  Service: Cardiovascular;  Laterality: N/A;  . LIPOMA EXCISION Left 08/25/2017   Procedure: EXCISION LIPOMA LEFT POSTERIOR THIGH;  Surgeon: Clovis Riley, MD;  Location: [place];  Service: General;  Laterality: Left;  . NEPHROLITHOTOMY  1990'S  . OPEN APPENDECTOMY W/ PARTIAL CECECTOMY  05-16-2004  . PERCUTANEOUS CORONARY STENT INTERVENTION (PCI-S) N/A 06/28/2012   Procedure: PERCUTANEOUS CORONARY STENT INTERVENTION (PCI-S);  Surgeon: Sherren Mocha, MD;  Location: [facility] CATH LAB;  Service: Cardiovascular;  Laterality: N/A;        Home Medications    Prior to Admission medications   Medication Sig Start Date End Date Taking? Authorizing Provider  aspirin EC 81 MG tablet Take 1 tablet (81 mg total) by mouth daily. Restart With Breakfast/Food on 10/18/17 10/05/17  Yes Emokpae, Courage, MD  atorvastatin (LIPITOR) 40 MG tablet TAKE 1 TABLET BY MOUTH DAILY AT 6 PM 12/04/17  Yes  [name], MD  ferrous sulfate 325 (65 FE) MG EC tablet Take 325 mg by mouth daily.   Yes [provider]  furosemide (LASIX) 80 MG tablet Take 1 tablet (80 mg total) by mouth 2 (two) times daily. 10/05/17  Yes Emokpae, Courage, MD  gabapentin (NEURONTIN) 100 MG capsule TAKE 1 CAPSULE(100 MG) BY MOUTH TWICE DAILY Patient taking differently: TAKE 1 CAPSULE(100 MG) BY MOUTH  DAILY 10/13/17  Yes [name], MD  losartan (COZAAR) 50 MG tablet TAKE 1 TABLET (56m) BY MOUTH EVERY DAY 12/04/17  Yes CHoyt Koch MD  metFORMIN (GLUCOPHAGE) 850 MG tablet Take 850 mg by mouth 2 (two) times daily with a meal.   Yes [provider]  metoprolol tartrate (LOPRESSOR) 25 MG tablet Take 0.5 tablets (12.5 mg total) by mouth 2 (two) times daily. For Heart 10/09/17 10/09/18 Yes Nahser, PWonda Cheng MD  nitroGLYCERIN (NITROSTAT) 0.4 MG SL tablet Place 1 tablet (0.4 mg total) under the tongue every 5 (five) minutes as needed for chest pain. 06/08/16 12/18/17 Yes Nahser, Wonda Cheng, MD  polyethylene glycol [employer] / Floria Raveling) packet Take 17 g by mouth 2 (two) times daily. Patient taking differently: Take 17 g by mouth daily as needed.  10/05/17  Yes Emokpae, Courage, MD  spironolactone (ALDACTONE) 50 MG tablet Take 1 tablet (50 mg total) by mouth daily. 11/23/17  Yes Charlynne Cousins, MD  umeclidinium-vilanterol (ANORO ELLIPTA) 62.5-25 MCG/INH AEPB Inhale 1 puff into the lungs daily. 09/21/17  Yes [name], MD  blood glucose meter kit and supplies Dispense based on patient and insurance preference. Use up to four times daily as directed. (FOR ICD-10 E10.9, E11.9). Patient not taking: Reported on 12/18/2017 10/24/17   [name], MD  diclofenac sodium (VOLTAREN) 1 % GEL Apply 2 g topically 4 (four) times daily. Patient not taking: Reported on 12/18/2017 12/04/17   [name], MD  [facility] VERIO test strip USE TO TEST UP TO FOUR TIMES DAILY AS  DIRECTED Patient not taking: Reported on 12/18/2017 10/24/17   [name], MD    Family History Family History  Problem Relation Age of Onset  . Lung cancer Sister   . Cancer Sister        lung  . Cancer Mother   . Cancer Father        died in his 52s.  . Cancer Brother        lung  . Coronary artery disease Unknown   . Diabetes Unknown   . Colon cancer Unknown   . Cancer Sister        lung    Social History Social History   Tobacco Use  . Smoking status: Former Smoker    Packs/day: 2.00    Years: 40.00    Pack years: 80.00    Types: Cigarettes    Last attempt to quit: 08/29/1996    Years since quitting: 21.3  . Smokeless tobacco: Never Used  Substance Use Topics  . Alcohol use: Yes    Alcohol/week: 0.0 oz    Comment: occasional  . Drug use: No     Allergies   Patient has no known allergies.   Review of Systems Review of Systems  Constitutional: Negative for chills and fever.  HENT: Negative for ear pain, rhinorrhea and sore throat.   Eyes: Negative for pain and visual disturbance.  Respiratory: Positive for shortness of breath. Negative for cough, hemoptysis, sputum production and wheezing.   Cardiovascular: Positive for leg swelling. Negative for chest pain, palpitations, orthopnea, syncope and PND.  Gastrointestinal: Positive for abdominal pain (epigastric). Negative for vomiting.       Intermittent melena  Genitourinary: Negative for dysuria and hematuria.  Musculoskeletal: Negative for arthralgias, back pain and neck pain.  Skin: Negative for color change and rash.  Neurological: Negative for seizures, syncope and headaches.  All other systems reviewed and are negative.    Physical Exam Updated Vital Signs  ED Triage Vitals [12/18/17 1509]  Enc Vitals Group     BP (!) 145/99     Pulse Rate 77     Resp 20     Temp 98.7 F (37.1 C)     Temp Source Oral     SpO2 100 %     Weight 240 lb (108.9 kg)     Height _0  (1.702 m)  Head  Circumference      Peak Flow      Pain Score 3     Pain Loc      Pain Edu?      Excl. in [place]?     Physical Exam  Constitutional: He appears well-developed and well-nourished.  HENT:  Head: Normocephalic and atraumatic.  Mouth/Throat: Oropharynx is clear and moist.  Eyes: Pupils are equal, round, and reactive to light. Conjunctivae are normal.  Neck: Normal range of motion. Neck supple.  Cardiovascular: Normal rate and regular rhythm.  No murmur heard. Pulmonary/Chest: Effort normal and breath sounds normal. No respiratory distress.  Abdominal: Soft. There is tenderness (epigastric region).  Musculoskeletal:       Right lower leg: He exhibits edema (3+).       Left lower leg: He exhibits edema (3+).  Weakly melanotic stool  Neurological: He is alert.  Skin: Skin is warm and dry. No rash noted.  Psychiatric: He has a normal mood and affect.  Nursing note and vitals reviewed.    ED Treatments / Results  Labs (all labs ordered are listed, but only abnormal results are displayed) Labs Reviewed  COMPREHENSIVE METABOLIC PANEL - Abnormal; Notable for the following components:      Result Value   CO2 19 (*)    Glucose, Bld 136 (*)    BUN 36 (*)    Creatinine, Ser 1.45 (*)    Calcium 8.4 (*)    ALT 14 (*)    GFR calc non Af Amer 47 (*)    GFR calc Af Amer 54 (*)    All other components within normal limits  CBC - Abnormal; Notable for the following components:   RBC 2.56 (*)    Hemoglobin 6.3 (*)    HCT 21.2 (*)    MCH 24.6 (*)    MCHC 29.7 (*)    RDW 16.7 (*)    All other components within normal limits  PROTIME-INR  I-STAT TROPONIN, ED  POC OCCULT BLOOD, ED  TYPE AND SCREEN  PREPARE RBC (CROSSMATCH)    EKG None  Radiology Dg Chest 2 View  Result Date: 12/18/2017 CLINICAL DATA:  Chest pain. EXAM: CHEST - 2 VIEW COMPARISON:  Radiograph of October 01, 2017. FINDINGS: The heart size and mediastinal contours are within normal limits. Both lungs are clear. Status  post coronary artery bypass graft. No pneumothorax or pleural effusion is noted. Atherosclerosis of thoracic aorta is noted. The visualized skeletal structures are unremarkable. IMPRESSION: No active cardiopulmonary disease. Aortic Atherosclerosis (ICD10-I70.0). Electronically Signed   By: Marijo Conception, M.D.   On: 12/18/2017 16:08    Procedures Procedures (including critical care time)  Medications Ordered in ED Medications  pantoprazole (PROTONIX) injection 40 mg (has no administration in time range)  cefTRIAXone (ROCEPHIN) 1 g in sodium chloride 0.9 % 100 mL IVPB (1 g Intravenous New Bag/Given 12/18/17 2054)     Initial Impression / Assessment and Plan / ED Course  I have reviewed the triage vital signs and the nursing notes.  Pertinent labs & imaging results that were available during my care of the patient were reviewed by me and considered in my medical decision making (see chart for details).     [name] is a 72 year old male with history of hypertension, coronary artery disease status post stents, hepatic cirrhosis with history of bleeding ulcers who presents to the ED with shortness of breath, melena.  Patient with overall normal vitals.  Patient states  that he has had some shortness of breath, melena, epigastric abdominal pain.  Concern for ulcer.  Patient states some may be chest pain as well.  Patient has history of hepatic cirrhosis but denies any history of varices.  Patient does not have any abdominal pain at this time.  Overall feels well but states that with exertion is when he starts to feel fatigued and tired and short of breath.  Has a history of heart failure as well.  Patient overall chronically ill-appearing.  Peripheral edema on exam.  Has no abdominal pain on exam except some mild tenderness in the epigastric region.  Patient has fairly clear breath sounds on exam.  Has some melena speckled stool.  Concern for GI bleed likely ulcer.  Has a history of chronic H.  pylori.  Currently is without any chest pain now.  EKG showed normal sinus rhythm with no signs of ischemic changes.  Patient had lab work done prior to my evaluation and was significant for an anemia with a hemoglobin of 6.3.  Patient otherwise with normal troponin. INR normal, continues to be on aspirin.  Chest x-ray with no signs of pneumonia, pneumothorax, pleural effusion.  Creatinine at baseline.  Otherwise no significant electrolyte abnormality.  Given concern for GI bleed patient was given IV Protonix, IV Rocephin.  EGD recently in February showed chronic H. pylori and Barrett's esophagus.  Patient also typed and screened and will transfuse 1 unit of packed red blood cells.  Patient with normal hemodynamics on exam. EKG NSR with no signs of ischemia.  [facility] was consulted and recommend already done workup.  They will see the patient in the morning.  Patient to be admitted to medicine for further care.  Hemodynamically stable throughout my care.  Final Clinical Impressions(s) / ED Diagnoses   Final diagnoses:  Gastrointestinal hemorrhage, unspecified gastrointestinal hemorrhage type  Symptomatic anemia    ED Discharge Orders    None       Lennice Sites, DO 12/18/17 2117    Deno Etienne, DO 12/18/17 2203

## 2017-12-18 NOTE — H&P (Signed)
History and Physical    [name] FUX:[id number] DOB: [birth year] DOA: 12/18/2017  PCP: John Koch, MD   Patient coming from: Home  Chief Complaint: Melena, epigastric pain, DOE, gen weakness   HPI: [name] is a 72 y.o. male with medical history significant for hepatic cirrhosis, coronary artery disease, chronic kidney disease stage III, and type 2 diabetes mellitus, now presenting to the emergency department for evaluation of melena, epigastric pain, and progressive exertional dyspnea and generalized weakness.  Patient reports that he has noted black stool for the past couple days and has experienced progressive shortness of breath and generalized weakness over this interval.  He has had mild to moderate pain in the epigastrium.  He denies fevers, chills, chest pain, or cough.  ED Course: Upon arrival to the ED, patient is found to be afebrile, saturating well on room air, slightly tachycardic, and blood pressure normal.  EKG features a normal sinus rhythm and chest x-ray is negative for acute cardiopulmonary disease.  Chemistry panel is notable for a BUN of 36 and creatinine 1.45, very close to his apparent baseline.  CBC features a hemoglobin of 6.3, down from 8.3 earlier this month, and down from 10.1 late last month.  INR is normal.  1 unit of packed red blood cells was ordered for immediate transfusion, gastroenterology was consulted by the ED physician, and the patient was started on IV PPI and given a dose of empiric Rocephin.  He will be admitted for ongoing evaluation and management of symptomatic anemia, likely secondary to upper GI bleeding.  Review of Systems:  All other systems reviewed and apart from HPI, are negative.  Past Medical History:  Diagnosis Date  . Arthritis    "joints tighten up sometimes" (03/27/2104)  . Asthma   . CHF (congestive heart failure) ([place])   . Chronic low back pain   . Chronic lower back pain   . Coronary artery disease    a.  05/2012 Cath/PCI: LM 40ost, 50-60d, LAD 99p ruptured plaque (3.0x28 DES), LCX 50p/m, RCA 30-40p, 88m EF 65-70%.  . Family history of adverse reaction to anesthesia    Pt granddaughter had PONV  . GERD (gastroesophageal reflux disease)   . [place]    a. Dx 01/2014 - CT a/p   . History of blood transfusion    "related to bleeding ulcers"  . History of concussion    1976--  NO RESIDUAL  . History of GI bleed    a. UGIB 07/2012;  b. 01/2014 admission with GIB/FOB stool req 1U prbc's->EGD showed portal gastropathy, barrett's esoph, and chronic active h. pylori gastritis.  .Marland KitchenHistory of gout    2007 &  2008  LEFT LEG-- NO ISSUE SINCE  . Hyperlipidemia   . Iron deficiency anemia   . Kidney stones   . Lipoma   . OA (osteoarthritis of spine)    LOWER BACK--  INTERMITTANT LEFT LEG NUMBNESS  . OSA (obstructive sleep apnea)    PULMOLOGIST-  DR CLANCE--  MODERATE OSA  STARTED CPAP 2012--  BUT CURRENTLY HAS NOT USED PAST 6 MONTHS  . Phimosis    a. s/p circumcision 2015.  . Type 2 diabetes mellitus ([place]   . Unspecified essential hypertension     Past Surgical History:  Procedure Laterality Date  . ANKLE FRACTURE SURGERY Right 1989   "plate put in"  . APPENDECTOMY  05-16-2004   open  . CIRCUMCISION N/A 09/09/2013   Procedure: CIRCUMCISION ADULT;  Surgeon: Bernestine Amass, MD;  Location[facility];  Service: Urology;  Laterality: N/A;  . COLECTOMY  05-16-2004  . CORONARY ANGIOPLASTY WITH STENT PLACEMENT  06/28/2012  DR COOPER   PCI W/  X1 DES to [place]. LAD/  LM  40% OSTIAL & 50-60% DISTAL /  50% PROX LCX/  30-40% PROX RCA & 50% MID RCA/   LVEF 65-70%  . CORONARY ARTERY BYPASS GRAFT N/A 03/31/2014   Procedure: CORONARY ARTERY BYPASS GRAFTING (CABG) times 3 using left internal mammary artery and right saphenous vein.;  Surgeon: Melrose Nakayama, MD;  Location: [place];  Service: Open Heart Surgery;  Laterality: N/A;  . DOBUTAMINE STRESS ECHO  06-08-2012   MODERATE  HYPOKINESIS/ ISCHEMIA MID INFERIOR WALL  . ESOPHAGOGASTRODUODENOSCOPY  08/15/2012   Procedure: ESOPHAGOGASTRODUODENOSCOPY (EGD);  Surgeon: Wonda Horner, MD;  Location[facility] ENDOSCOPY;  Service: Endoscopy;  Laterality: N/A;  . ESOPHAGOGASTRODUODENOSCOPY N/A 02/17/2014   Procedure: ESOPHAGOGASTRODUODENOSCOPY (EGD);  Surgeon: Jeryl Columbia, MD;  Location: [facility] ENDOSCOPY;  Service: Endoscopy;  Laterality: N/A;  . ESOPHAGOGASTRODUODENOSCOPY (EGD) WITH PROPOFOL N/A 09/30/2017   Procedure: ESOPHAGOGASTRODUODENOSCOPY (EGD) WITH PROPOFOL;  Surgeon: Wonda Horner, MD;  Location: [facility] ENDOSCOPY;  Service: Endoscopy;  Laterality: N/A;  . HERNIA REPAIR    . INTRAOPERATIVE TRANSESOPHAGEAL ECHOCARDIOGRAM N/A 03/31/2014   Procedure: INTRAOPERATIVE TRANSESOPHAGEAL ECHOCARDIOGRAM;  Surgeon: Melrose Nakayama, MD;  Location: [place];  Service: Open Heart Surgery;  Laterality: N/A;  . LAPAROSCOPIC UMBILICAL HERNIA REPAIR W/ MESH  06-06-2011  . LEFT HEART CATHETERIZATION WITH CORONARY ANGIOGRAM N/A 03/28/2014   Procedure: LEFT HEART CATHETERIZATION WITH CORONARY ANGIOGRAM;  Surgeon: Sinclair Grooms, MD;  Location: [facility] C[facility] LAB;  Service: Cardiovascular;  Laterality: N/A;  . LIPOMA EXCISION Left 08/25/2017   Procedure: EXCISION LIPOMA LEFT POSTERIOR THIGH;  Surgeon: Clovis Riley, MD;  Location: [place];  Service: General;  Laterality: Left;  . NEPHROLITHOTOMY  1990'S  . OPEN APPENDECTOMY W/ PARTIAL CECECTOMY  05-16-2004  . PERCUTANEOUS CORONARY STENT INTERVENTION (PCI-S) N/A 06/28/2012   Procedure: PERCUTANEOUS CORONARY STENT INTERVENTION (PCI-S);  Surgeon: Sherren Mocha, MD;  Location: [facility] CATH LAB;  Service: Cardiovascular;  Laterality: N/A;     reports that he quit smoking about 21 years ago. His smoking use included cigarettes. He has a 80.00 pack-year smoking history. He has never used smokeless tobacco. He reports that he drinks alcohol. He reports that he does not use drugs.  No Known Allergies  Family History    Problem Relation Age of Onset  . Lung cancer Sister   . Cancer Sister        lung  . Cancer Mother   . Cancer Father        died in his 65s.  . Cancer Brother        lung  . Coronary artery disease Unknown   . Diabetes Unknown   . Colon cancer Unknown   . Cancer Sister        lung     Prior to Admission medications   Medication Sig Start Date End Date Taking? Authorizing Provider  aspirin EC 81 MG tablet Take 1 tablet (81 mg total) by mouth daily. Restart With Breakfast/Food on 10/18/17 10/05/17  Yes Emokpae, Courage, MD  atorvastatin (LIPITOR) 40 MG tablet TAKE 1 TABLET BY MOUTH DAILY AT 6 PM 12/04/17  Yes John Koch, MD  ferrous sulfate 325 (65 FE) MG EC tablet Take 325 mg by mouth daily.   Yes [provider]  furosemide (LASIX) 80 MG tablet Take 1 tablet (80 mg total) by mouth 2 (two) times daily. 10/05/17  Yes Emokpae, Courage, MD  gabapentin (NEURONTIN) 100 MG capsule TAKE 1 CAPSULE(100 MG) BY MOUTH TWICE DAILY Patient taking differently: TAKE 1 CAPSULE(100 MG) BY MOUTH  DAILY 10/13/17  Yes John Koch, MD  losartan (COZAAR) 50 MG tablet TAKE 1 TABLET (69m) BY MOUTH EVERY DAY 12/04/17  Yes CHoyt Koch MD  metFORMIN (GLUCOPHAGE) 850 MG tablet Take 850 mg by mouth 2 (two) times daily with a meal.   Yes [provider]  metoprolol tartrate (LOPRESSOR) 25 MG tablet Take 0.5 tablets (12.5 mg total) by mouth 2 (two) times daily. For Heart 10/09/17 10/09/18 Yes Nahser, PWonda Cheng MD  nitroGLYCERIN (NITROSTAT) 0.4 MG SL tablet Place 1 tablet (0.4 mg total) under the tongue every 5 (five) minutes as needed for chest pain. 06/08/16 12/18/17 Yes Nahser, PWonda Cheng MD  polyethylene glycol ([facility]/ GFloria Raveling packet Take 17 g by mouth 2 (two) times daily. Patient taking differently: Take 17 g by mouth daily as needed.  10/05/17  Yes Emokpae, Courage, MD  spironolactone (ALDACTONE) 50 MG tablet Take 1 tablet (50 mg total) by mouth daily. 11/23/17  Yes FCharlynne Cousins MD  umeclidinium-vilanterol (ANORO ELLIPTA) 62.5-25 MCG/INH AEPB Inhale 1 puff into the lungs daily. 09/21/17  Yes CHoyt Koch MD  blood glucose meter kit and supplies Dispense based on patient and insurance preference. Use up to four times daily as directed. (FOR ICD-10 E10.9, E11.9). Patient not taking: Reported on 12/18/2017 10/24/17   CHoyt Koch MD  diclofenac sodium (VOLTAREN) 1 % GEL Apply 2 g topically 4 (four) times daily. Patient not taking: Reported on 12/18/2017 12/04/17   CHoyt Koch MD  [facility] test strip USE TO TEST UP TO FOUR TIMES DAILY AS DIRECTED Patient not taking: Reported on 12/18/2017 10/24/17   CHoyt Koch MD    Physical Exam: Vitals:   12/18/17 2100 12/18/17 2106 12/18/17 2115 12/18/17 2130  BP: 139/69 138/84 138/70 125/68  Pulse: 68 68 67 68  Resp: '18  19 14  ' Temp:  98.3 F (36.8 C)    TempSrc:      SpO2: 100% 100% 100% 100%  Weight:      Height:          Constitutional: NAD, calm  Eyes: PERTLA, lids and conjunctivae normal ENMT: Mucous membranes are moist. Posterior pharynx clear of any exudate or lesions.   Neck: normal, supple, no masses, no thyromegaly Respiratory: clear to auscultation bilaterally, no wheezing, no crackles. Normal respiratory effort.   Cardiovascular: S1 & S2 heard, regular rate and rhythm. Pretibial edema bilaterally to the knees. Abdomen: mild distenssion, no tenderness, soft. Bowel sounds normal.  Musculoskeletal: no clubbing / cyanosis. No joint deformity upper and lower extremities.    Skin: hyperpigmentation to bilateral LE's distally in gaiter distribution. Warm, dry, well-perfused. Neurologic: CN 2-12 grossly intact. Sensation intact. Strength 5/5 in all 4 limbs.  Psychiatric: Alert and oriented x 3. Calm, cooperative.     Labs on Admission: I have personally reviewed following labs and imaging studies  CBC: Recent Labs  Lab 12/18/17 1509  WBC 4.8  HGB 6.3*   HCT 21.2*  MCV 82.8  PLT 1923  Basic Metabolic Panel: Recent Labs  Lab 12/18/17 1509  NA 137  K 4.8  CL 108  CO2 19*  GLUCOSE 136*  BUN 36*  CREATININE 1.45*  CALCIUM 8.4*   GFR: Estimated Creatinine Clearance: 55 mL/min (A) (by C-G formula based on SCr of 1.45 mg/dL (H)). Liver Function Tests: Recent Labs  Lab 12/18/17 1509  AST 21  ALT 14*  ALKPHOS 85  BILITOT 0.5  PROT 6.7  ALBUMIN 3.6   No results for input(s): LIPASE, AMYLASE in the last 168 hours. No results for input(s): AMMONIA in the last 168 hours. Coagulation Profile: Recent Labs  Lab 12/18/17 1531  INR 1.08   Cardiac Enzymes: No results for input(s): CKTOTAL, CKMB, CKMBINDEX, TROPONINI in the last 168 hours. BNP (last 3 results) No results for input(s): PROBNP in the last 8760 hours. HbA1C: No results for input(s): HGBA1C in the last 72 hours. CBG: No results for input(s): GLUCAP in the last 168 hours. Lipid Profile: No results for input(s): CHOL, HDL, LDLCALC, TRIG, CHOLHDL, LDLDIRECT in the last 72 hours. Thyroid Function Tests: No results for input(s): TSH, T4TOTAL, FREET4, T3FREE, THYROIDAB in the last 72 hours. Anemia Panel: No results for input(s): VITAMINB12, FOLATE, FERRITIN, TIBC, IRON, RETICCTPCT in the last 72 hours. Urine analysis:    Component Value Date/Time   COLORURINE YELLOW 03/29/2014 1546   APPEARANCEUR CLEAR 03/29/2014 1546   LABSPEC 1.011 03/29/2014 1546   PHURINE 6.0 03/29/2014 1546   GLUCOSEU NEGATIVE 03/29/2014 1546   HGBUR NEGATIVE 03/29/2014 1546   BILIRUBINUR NEGATIVE 03/29/2014 1546   KETONESUR NEGATIVE 03/29/2014 1546   PROTEINUR NEGATIVE 03/29/2014 1546   UROBILINOGEN 0.2 03/29/2014 1546   NITRITE NEGATIVE 03/29/2014 1546   LEUKOCYTESUR NEGATIVE 03/29/2014 1546   Sepsis Labs: '@LABRCNTIP' (procalcitonin:4,lacticidven:4) )No results found for this or any previous visit (from the past 240 hour(s)).   Radiological Exams on Admission: Dg Chest 2  View  Result Date: 12/18/2017 CLINICAL DATA:  Chest pain. EXAM: CHEST - 2 VIEW COMPARISON:  Radiograph of October 01, 2017. FINDINGS: The heart size and mediastinal contours are within normal limits. Both lungs are clear. Status post coronary artery bypass graft. No pneumothorax or pleural effusion is noted. Atherosclerosis of thoracic aorta is noted. The visualized skeletal structures are unremarkable. IMPRESSION: No active cardiopulmonary disease. Aortic Atherosclerosis (ICD10-I70.0). Electronically Signed   By: Marijo Conception, M.D.   On: 12/18/2017 16:08    EKG: Independently reviewed. Normal sinus rhythm.   Assessment/Plan   1. Upper GI bleed; symptomatic anemia; PUD - Presents with a few days of melena and progressive SOB and generalized weakness; reports mild-mod pain in epigastrium  - Found to have Hgb 6.3, down from 8.3 earlier this month and 10.1 last month  - Hemodynamically stable; no vomiting  - EGD in February '19 with normal esophagus, erosive gastropathy, and one duodenal ulcer  - GI consulting and much appreciated  - One unit RBC was ordered from ED; check post-transfusion H&H  - Hold ASA, start IV PPI, ppx Rocephin     2. Cirrhosis  - Appears well-compensated; no esophageal varices on EGD two months ago  - Hold diuretics while NPO, start PPI as above, monitor lytes    3. Type II DM  - A1c was 6.5% in December  - Managed at home with metformin, held on admission  - Check CBG's and use a SSI with Novolog while in hospital    4. CKD stage III  - SCr is 1.45 on admission, slightly higher than apparent baseline  - Renally-dose medications, avoid nephrotoxins    5. CAD - No anginal complaints  - Hold ASA in light of bleed, continue ARB and beta-blocker as  tolerated     DVT prophylaxis: SCD's  Code Status: Full  Family Communication: Discussed with patient Consults called: Gastroenterology Admission status: Inpatient    Vianne Bulls, MD [facility] Pager [phone]  If 7PM-7AM, please contact night-coverage [email or web address] Password [facility]  12/18/2017, 10:23 PM

## 2017-12-18 NOTE — ED Notes (Signed)
No reply for vitals x3.

## 2017-12-19 ENCOUNTER — Inpatient Hospital Stay (HOSPITAL_COMMUNITY): Payer: PPO

## 2017-12-19 ENCOUNTER — Other Ambulatory Visit: Payer: Self-pay

## 2017-12-19 DIAGNOSIS — K264 Chronic or unspecified duodenal ulcer with hemorrhage: Secondary | ICD-10-CM

## 2017-12-19 LAB — GLUCOSE, CAPILLARY
GLUCOSE-CAPILLARY: 107 mg/dL — AB (ref 65–99)
Glucose-Capillary: 104 mg/dL — ABNORMAL HIGH (ref 65–99)
Glucose-Capillary: 105 mg/dL — ABNORMAL HIGH (ref 65–99)
Glucose-Capillary: 163 mg/dL — ABNORMAL HIGH (ref 65–99)

## 2017-12-19 LAB — COMPREHENSIVE METABOLIC PANEL
ALBUMIN: 3 g/dL — AB (ref 3.5–5.0)
ALT: 13 U/L — ABNORMAL LOW (ref 17–63)
ANION GAP: 5 (ref 5–15)
AST: 19 U/L (ref 15–41)
Alkaline Phosphatase: 68 U/L (ref 38–126)
BILIRUBIN TOTAL: 0.5 mg/dL (ref 0.3–1.2)
BUN: 27 mg/dL — AB (ref 6–20)
CHLORIDE: 112 mmol/L — AB (ref 101–111)
CO2: 20 mmol/L — AB (ref 22–32)
Calcium: 7.8 mg/dL — ABNORMAL LOW (ref 8.9–10.3)
Creatinine, Ser: 1.2 mg/dL (ref 0.61–1.24)
GFR calc Af Amer: >60 mL/min (ref >60)
GFR calc non Af Amer: 59 mL/min — ABNORMAL LOW (ref >60)
GLUCOSE: 96 mg/dL (ref 65–99)
POTASSIUM: 4.1 mmol/L (ref 3.5–5.1)
SODIUM: 137 mmol/L (ref 135–145)
TOTAL PROTEIN: 5.4 g/dL — AB (ref 6.5–8.1)

## 2017-12-19 LAB — CBG MONITORING, ED
Glucose-Capillary: 138 mg/dL — ABNORMAL HIGH (ref 65–99)
Glucose-Capillary: 101 mg/dL — ABNORMAL HIGH (ref 65–99)
Glucose-Capillary: 91 mg/dL (ref 65–99)
Glucose-Capillary: 95 mg/dL (ref 65–99)

## 2017-12-19 LAB — CBC
HEMATOCRIT: 20.1 % — AB (ref 39.0–52.0)
Hemoglobin: 6 g/dL — CL (ref 13.0–17.0)
MCH: 24.8 pg — AB (ref 26.0–34.0)
MCHC: 29.9 g/dL — ABNORMAL LOW (ref 30.0–36.0)
MCV: 83.1 fL (ref 78.0–100.0)
Platelets: 124 10*3/uL — ABNORMAL LOW (ref 150–400)
RBC: 2.42 MIL/uL — AB (ref 4.22–5.81)
RDW: 16.2 % — ABNORMAL HIGH (ref 11.5–15.5)
WBC: 4 10*3/uL (ref 4.0–10.5)

## 2017-12-19 LAB — PREPARE RBC (CROSSMATCH)

## 2017-12-19 MED ORDER — SODIUM CHLORIDE 0.9 % IV SOLN
Freq: Once | INTRAVENOUS | Status: AC
  Administered 2017-12-19: 12:00:00 via INTRAVENOUS

## 2017-12-19 NOTE — Progress Notes (Signed)
New Admission Note:   Arrival Method: Wheelchair Mental Orientation: Alert x 4 Telemetry: Yes box 04 Assessment: Completed Skin: Intact IV: two : clean, dry, intact  Pain: 0 (0 to10) Tubes: None Safety Measures: Safety Fall Prevention Plan has been discussed  Admission: completed  5 [place] Orientation: Patient has been orientated to the room, unit and staff.   Family: none  Orders to be reviewed and implemented. Will continue to monitor the patient. Call light has been placed within reach and bed alarm has been activated.   Baldo Ash, RN

## 2017-12-19 NOTE — Progress Notes (Signed)
[facility]  Patient Status: Active (receiving services up to time of hospitalization)  [facility] is providing the following services: RN  If patient discharges after hours, please call [phone].   [name] 12/19/2017, 10:19 AM

## 2017-12-19 NOTE — Anesthesia Preprocedure Evaluation (Addendum)
Anesthesia Evaluation  Patient identified by MRN, date of birth, ID band Patient awake    Reviewed: Allergy & Precautions, H&P , NPO status , Patient's Chart, lab work & pertinent test results  Airway Mallampati: II  TM Distance: >3 FB Neck ROM: Full    Dental  (+) Edentulous Upper, Edentulous Lower   Pulmonary asthma , sleep apnea , former smoker,    breath sounds clear to auscultation       Cardiovascular hypertension, Pt. on medications + CAD, + Cardiac Stents, + CABG and +CHF   Rhythm:Regular Rate:Normal  '19 TTE - Moderate LVH. EF of 60% to 65%. Mild AI. Moderately dilated atria b/l.    Neuro/Psych negative neurological ROS  negative psych ROS   GI/Hepatic PUD, GERD  ,(+) Cirrhosis       ,   Endo/Other  diabetes, Type 2, Oral Hypoglycemic AgentsMorbid obesity  Renal/GU Nephrolithiasis     Musculoskeletal  (+) Arthritis , Gout   Abdominal   Peds  Hematology  (+) anemia , Thrombocytopenia   Anesthesia Other Findings   Reproductive/Obstetrics                            Anesthesia Physical  Anesthesia Plan  ASA: III  Anesthesia Plan: MAC   Post-op Pain Management:    Induction: Intravenous  PONV Risk Score and Plan: Treatment may vary due to age or medical condition and Propofol infusion  Airway Management Planned: Simple Face Mask  Additional Equipment: None  Intra-op Plan:   Post-operative Plan:   Informed Consent: I have reviewed the patients History and Physical, chart, labs and discussed the procedure including the risks, benefits and alternatives for the proposed anesthesia with the patient or authorized representative who has indicated his/her understanding and acceptance.   Dental advisory given  Plan Discussed with: CRNA and Anesthesiologist  Anesthesia Plan Comments:         Anesthesia Quick Evaluation

## 2017-12-19 NOTE — Care Management Note (Addendum)
Case Management Note  Patient Details  Name: [name] MRN: [id number] Date of Birth: 1945-09-19  Subjective/Objective:   History of liver cirrhosis, coronary artery disease, chronic kidney disease stage III, history of recent GI bleed in February with EGD showing duodenal ulcer.  Admitted for GI Bleed, Hbg 6.3.           Action/Plan: GI consulted- Transfuse one more units of PRBC. PPI. EGD tomorrow 12/20/17 for further evaluation,  liquid diet today. NPO past midnight.  Ultrasound liver for follow-up of cirrhosis. GI will follow.   Prior to admission patient lived at home with wife.  Will be returning to the same living situation after discharge.  At discharge, patient has transportation home.  Patient has the ability to pay for  medications and food. Prior to admission [facility] with [facility] for [facility].  [facility] discharge liaison is following. NCM will continue to monitor for discharge transition needs.  Expected Discharge Date: to be determined                 Expected Discharge Plan:   home w/ home health In-House Referral:    N/A Discharge planning Services   CM consult  DME Arranged:    DME Agency:     [facility] Active prior to admission: RN    [place]    Status of Service:   In progress  Kristen Cardinal, RN 12/19/2017, 12:45 PM

## 2017-12-19 NOTE — Consult Note (Signed)
Referring Provider:  [facility]  Primary Care Physician:  [name], MD Primary Gastroenterologist:  Dr. Penelope Coop   Reason for Consultation:  GI bleed, melena  HPI: [name] is a 72 y.o. male with past medical history of liver cirrhosis, coronary artery disease, chronic kidney disease stage III, history of recent GI bleed in February with EGD showing duodenal ulcer presented to the hospital with epigastric pain, weakness. was complaining of black color stool for last few days.Was found to have anemia with hemoglobin down to 6.3.GI is consulted for further evaluation.  Patient seen and examined at bedside. Patient was feeling fine until 2 day black color stool. He denied any associated abdominal pain He was complaining of abdominal bloating Denies diarrhea or constipation.Had bowel movement this morning which was dark in color  but was not black tarry.Denied any bright red blood per rectum. Denied significant NSAID use.  Past Medical History:  Diagnosis Date  . Arthritis    "joints tighten up sometimes" (03/27/2104)  . Asthma   . CHF (congestive heart failure) ([place])   . Chronic low back pain   . Chronic lower back pain   . Coronary artery disease    a. 05/2012 Cath/PCI: LM 40ost, 50-60d, LAD 99p ruptured plaque (3.0x28 DES), LCX 50p/m, RCA 30-40p, 12m EF 65-70%.  . Family history of adverse reaction to anesthesia    Pt granddaughter had PONV  . GERD (gastroesophageal reflux disease)   . Hepatic [place]    a. Dx 01/2014 - CT a/p   . History of blood transfusion    "related to bleeding ulcers"  . History of concussion    1976--  NO RESIDUAL  . History of GI bleed    a. UGIB 07/2012;  b. 01/2014 admission with GIB/FOB stool req 1U prbc's->EGD showed portal gastropathy, barrett's esoph, and chronic active h. pylori gastritis.  .Marland KitchenHistory of gout    2007 &  2008  LEFT LEG-- NO ISSUE SINCE  . Hyperlipidemia   . Iron deficiency anemia   . Kidney stones   . Lipoma   . OA  (osteoarthritis of spine)    LOWER BACK--  INTERMITTANT LEFT LEG NUMBNESS  . OSA (obstructive sleep apnea)    PULMOLOGIST-  DR CLANCE--  MODERATE OSA  STARTED CPAP 2012--  BUT CURRENTLY HAS NOT USED PAST 6 MONTHS  . Phimosis    a. s/p circumcision 2015.  . Type 2 diabetes mellitus ([place]   . Unspecified essential hypertension     Past Surgical History:  Procedure Laterality Date  . ANKLE FRACTURE SURGERY Right 1989   "plate put in"  . APPENDECTOMY  05-16-2004   open  . CIRCUMCISION N/A 09/09/2013   Procedure: CIRCUMCISION ADULT;  Surgeon: DBernestine Amass MD;  [facility]  Service: Urology;  Laterality: N/A;  . COLECTOMY  05-16-2004  . CORONARY ANGIOPLASTY WITH STENT PLACEMENT  06/28/2012  DR COOPER   PCI W/  X1 DES to [place] LAD/  LM  40% OSTIAL & 50-60% DISTAL /  50% PROX LCX/  30-40% PROX RCA & 50% MID RCA/   LVEF 65-70%  . CORONARY ARTERY BYPASS GRAFT N/A 03/31/2014   Procedure: CORONARY ARTERY BYPASS GRAFTING (CABG) times 3 using left internal mammary artery and right saphenous vein.;  Surgeon: SMelrose Nakayama MD;  [place]  Service: Open Heart Surgery;  Laterality: N/A;  . DOBUTAMINE STRESS ECHO  06-08-2012   MODERATE HYPOKINESIS/ ISCHEMIA MID INFERIOR WALL  .  ESOPHAGOGASTRODUODENOSCOPY  08/15/2012   Procedure: ESOPHAGOGASTRODUODENOSCOPY (EGD);  Surgeon: Wonda Horner, MD;  Location: [facility] ENDOSCOPY;  Service: Endoscopy;  Laterality: N/A;  . ESOPHAGOGASTRODUODENOSCOPY N/A 02/17/2014   Procedure: ESOPHAGOGASTRODUODENOSCOPY (EGD);  Surgeon: Jeryl Columbia, MD;  [facility];  Service: Endoscopy;  Laterality: N/A;  . ESOPHAGOGASTRODUODENOSCOPY (EGD) WITH PROPOFOL N/A 09/30/2017   Procedure: ESOPHAGOGASTRODUODENOSCOPY (EGD) WITH PROPOFOL;  Surgeon: Wonda Horner, MD;  Location: [facility] ENDOSCOPY;  Service: Endoscopy;  Laterality: N/A;  . HERNIA REPAIR    . INTRAOPERATIVE TRANSESOPHAGEAL ECHOCARDIOGRAM N/A 03/31/2014   Procedure: INTRAOPERATIVE  TRANSESOPHAGEAL ECHOCARDIOGRAM;  Surgeon: Melrose Nakayama, MD;  Location: [place];  Service: Open Heart Surgery;  Laterality: N/A;  . LAPAROSCOPIC UMBILICAL HERNIA REPAIR W/ MESH  06-06-2011  . LEFT HEART CATHETERIZATION WITH CORONARY ANGIOGRAM N/A 03/28/2014   Procedure: LEFT HEART CATHETERIZATION WITH CORONARY ANGIOGRAM;  Surgeon: Sinclair Grooms, MD;  Location: [facility] CATH LAB;  Service: Cardiovascular;  Laterality: N/A;  . LIPOMA EXCISION Left 08/25/2017   Procedure: EXCISION LIPOMA LEFT POSTERIOR THIGH;  Surgeon: Clovis Riley, MD;  Location: [place];  Service: General;  Laterality: Left;  . NEPHROLITHOTOMY  1990'S  . OPEN APPENDECTOMY W/ PARTIAL CECECTOMY  05-16-2004  . PERCUTANEOUS CORONARY STENT INTERVENTION (PCI-S) N/A 06/28/2012   Procedure: PERCUTANEOUS CORONARY STENT INTERVENTION (PCI-S);  Surgeon: Sherren Mocha, MD;  Location: [facility] CATH LAB;  Service: Cardiovascular;  Laterality: N/A;    Prior to Admission medications   Medication Sig Start Date End Date Taking? Authorizing Provider  aspirin EC 81 MG tablet Take 1 tablet (81 mg total) by mouth daily. Restart With Breakfast/Food on 10/18/17 10/05/17  Yes Emokpae, Courage, MD  atorvastatin (LIPITOR) 40 MG tablet TAKE 1 TABLET BY MOUTH DAILY AT 6 PM 12/04/17  Yes [name], MD  ferrous sulfate 325 (65 FE) MG EC tablet Take 325 mg by mouth daily.   Yes [provider]  furosemide (LASIX) 80 MG tablet Take 1 tablet (80 mg total) by mouth 2 (two) times daily. 10/05/17  Yes Emokpae, Courage, MD  gabapentin (NEURONTIN) 100 MG capsule TAKE 1 CAPSULE(100 MG) BY MOUTH TWICE DAILY Patient taking differently: TAKE 1 CAPSULE(100 MG) BY MOUTH  DAILY 10/13/17  Yes [name], MD  losartan (COZAAR) 50 MG tablet TAKE 1 TABLET (47m) BY MOUTH EVERY DAY 12/04/17  Yes CHoyt Koch MD  metFORMIN (GLUCOPHAGE) 850 MG tablet Take 850 mg by mouth 2 (two) times daily with a meal.   Yes [provider]  metoprolol  tartrate (LOPRESSOR) 25 MG tablet Take 0.5 tablets (12.5 mg total) by mouth 2 (two) times daily. For Heart 10/09/17 10/09/18 Yes Nahser, PWonda Cheng MD  nitroGLYCERIN (NITROSTAT) 0.4 MG SL tablet Place 1 tablet (0.4 mg total) under the tongue every 5 (five) minutes as needed for chest pain. 06/08/16 12/18/17 Yes Nahser, PWonda Cheng MD  polyethylene glycol ([facility]/ GFloria Raveling packet Take 17 g by mouth 2 (two) times daily. Patient taking differently: Take 17 g by mouth daily as needed.  10/05/17  Yes Emokpae, Courage, MD  spironolactone (ALDACTONE) 50 MG tablet Take 1 tablet (50 mg total) by mouth daily. 11/23/17  Yes FCharlynne Cousins MD  umeclidinium-vilanterol (ANORO ELLIPTA) 62.5-25 MCG/INH AEPB Inhale 1 puff into the lungs daily. 09/21/17  Yes CHoyt Koch MD  blood glucose meter kit and supplies Dispense based on patient and insurance preference. Use up to four times daily as directed. (FOR ICD-10 E10.9, E11.9). Patient not taking: Reported on  12/18/2017 10/24/17   [name], MD  diclofenac sodium (VOLTAREN) 1 % GEL Apply 2 g topically 4 (four) times daily. Patient not taking: Reported on 12/18/2017 12/04/17   [name], MD  [facility] VERIO test strip USE TO TEST UP TO FOUR TIMES DAILY AS DIRECTED Patient not taking: Reported on 12/18/2017 10/24/17   [name], MD    Scheduled Meds: . atorvastatin  40 mg Oral q1800  . gabapentin  100 mg Oral BID  . insulin aspart  0-9 Units Subcutaneous Q4H  . losartan  50 mg Oral Daily  . metoprolol tartrate  12.5 mg Oral BID  . [START ON 12/22/2017] pantoprazole  40 mg Intravenous Q12H  . umeclidinium-vilanterol  1 puff Inhalation Daily   Continuous Infusions: . cefTRIAXone (ROCEPHIN)  IV    . pantoprozole (PROTONIX) infusion 8 mg/hr (12/19/17 0942)   PRN Meds:.acetaminophen **OR** acetaminophen, ondansetron **OR** ondansetron (ZOFRAN) IV  Allergies as of 12/18/2017  . (No Known Allergies)    Family History   Problem Relation Age of Onset  . Lung cancer Sister   . Cancer Sister        lung  . Cancer Mother   . Cancer Father        died in his 15s.  . Cancer Brother        lung  . Coronary artery disease Unknown   . Diabetes Unknown   . Colon cancer Unknown   . Cancer Sister        lung    Social History   Socioeconomic History  . Marital status: Married    Spouse name: Not on file  . Number of children: Not on file  . Years of education: Not on file  . Highest education level: Not on file  Occupational History  . Not on file  Social Needs  . Financial resource strain: Not on file  . Food insecurity:    Worry: Not on file    Inability: Not on file  . Transportation needs:    Medical: Not on file    Non-medical: Not on file  Tobacco Use  . Smoking status: Former Smoker    Packs/day: 2.00    Years: 40.00    Pack years: 80.00    Types: Cigarettes    Last attempt to quit: 08/29/1996    Years since quitting: 21.3  . Smokeless tobacco: Never Used  Substance and Sexual Activity  . Alcohol use: Yes    Alcohol/week: 0.0 oz    Comment: occasional  . Drug use: No  . Sexual activity: Yes  Lifestyle  . Physical activity:    Days per week: Not on file    Minutes per session: Not on file  . Stress: Not on file  Relationships  . Social connections:    Talks on phone: Not on file    Gets together: Not on file    Attends religious service: Not on file    Active member of club or organization: Not on file    Attends meetings of clubs or organizations: Not on file    Relationship status: Not on file  . Intimate partner violence:    Fear of current or ex partner: Not on file    Emotionally abused: Not on file    Physically abused: Not on file    Forced sexual activity: Not on file  Other Topics Concern  . Not on file  Social History Narrative   Lives in [place] with  wife, son, and son's family.    Review of Systems: Review of Systems  Constitutional: Positive for  malaise/fatigue. Negative for chills and fever.  HENT: Negative for hearing loss and tinnitus.   Eyes: Negative for blurred vision and double vision.  Respiratory: Positive for shortness of breath. Negative for cough and hemoptysis.   Cardiovascular: Negative for chest pain and palpitations.  Gastrointestinal: Positive for melena. Negative for abdominal pain, constipation, diarrhea, heartburn, nausea and vomiting.  Genitourinary: Negative for dysuria and urgency.  Musculoskeletal: Positive for myalgias.  Skin: Negative for itching and rash.  Neurological: Negative for seizures and loss of consciousness.  Endo/Heme/Allergies: Does not bruise/bleed easily.  Psychiatric/Behavioral: Negative for hallucinations and suicidal ideas.    Physical Exam: Vital signs: Vitals:   12/19/17 0839 12/19/17 0921  BP: (!) 124/56   Pulse: 66   Resp: 18   Temp: 97.8 F (36.6 C)   SpO2: 100% 96%     Physical Exam  Constitutional: He is oriented to person, place, and time. He appears well-developed and well-nourished. No distress.  HENT:  Head: Normocephalic and atraumatic.  Mouth/Throat: Oropharynx is clear and moist. No oropharyngeal exudate.  Eyes: EOM are normal. No scleral icterus.  Neck: Normal range of motion. Neck supple.  Cardiovascular: Normal rate, regular rhythm and normal heart sounds.  Pulmonary/Chest: Breath sounds normal. No respiratory distress.  Abdominal: Soft. Bowel sounds are normal. He exhibits no distension. There is no tenderness. There is no rebound and no guarding.  Neurological: He is alert and oriented to person, place, and time. No cranial nerve deficit.  Skin: Skin is warm. No erythema.  Psychiatric: He has a normal mood and affect. Judgment normal.  Vitals reviewed. LE: Trace edema with Changes of chronic vascular stasis.  GI:  Lab Results: Recent Labs    12/18/17 1509 12/19/17 0413  WBC 4.8 4.0  HGB 6.3* 6.0*  HCT 21.2* 20.1*  PLT 162 124*   BMET Recent  Labs    12/18/17 1509 12/19/17 0413  NA 137 137  K 4.8 4.1  CL 108 112*  CO2 19* 20*  GLUCOSE 136* 96  BUN 36* 27*  CREATININE 1.45* 1.20  CALCIUM 8.4* 7.8*   LFT Recent Labs    12/19/17 0413  PROT 5.4*  ALBUMIN 3.0*  AST 19  ALT 13*  ALKPHOS 68  BILITOT 0.5   PT/INR Recent Labs    12/18/17 1531  LABPROT 14.0  INR 1.08     Studies/Results: Dg Chest 2 View  Result Date: 12/18/2017 CLINICAL DATA:  Chest pain. EXAM: CHEST - 2 VIEW COMPARISON:  Radiograph of October 01, 2017. FINDINGS: The heart size and mediastinal contours are within normal limits. Both lungs are clear. Status post coronary artery bypass graft. No pneumothorax or pleural effusion is noted. Atherosclerosis of thoracic aorta is noted. The visualized skeletal structures are unremarkable. IMPRESSION: No active cardiopulmonary disease. Aortic Atherosclerosis (ICD10-I70.0). Electronically Signed   By: Marijo Conception, M.D.   On: 12/18/2017 16:08    Impression/Plan: - Melena  In patient with known duodenal ulcer.Most likely also related bleeding.EGD 2 months ago was negative for  Esophageal varices. - Acute blood loss anemia - History of cirrhosis. Normal LFTs. Normal INR - History of coronary artery disease.  Recommendations --------------------------- - Transfuse one more units of PRBC. - Continue PPI - EGD tomorrow for further evaluation . Risks, benefits, alternatives discussed with the patient. He verbalized  Understanding. Okay to liquid diet today. NPO past midnight. - Ultrasound liver  for follow-up of cirrhosis. - GI will follow.   LOS: 1 day   Otis Brace  MD, FACP 12/19/2017, 9:44 AM  Contact #  [phone]

## 2017-12-19 NOTE — Progress Notes (Addendum)
PROGRESS NOTE    [name]  IDP:[id number] DOB: 1946/07/06 DOA: 12/18/2017 PCP: [name], MD  Brief Narrative 72 y.o. male with medical history significant for hepatic cirrhosis, coronary artery disease, chronic kidney disease stage III, and type 2 diabetes mellitus, now presenting to the emergency department for evaluation of melena, epigastric pain, and progressive exertional dyspnea and generalized weakness.  Patient reports that he has noted black stool for the past couple days and has experienced progressive shortness of breath and generalized weakness over this interval.  He has had mild to moderate pain in the epigastrium.  He denies fevers, chills, chest pain, or cough.  ED Course: Upon arrival to the ED, patient is found to be afebrile, saturating well on room air, slightly tachycardic, and blood pressure normal.  EKG features a normal sinus rhythm and chest x-ray is negative for acute cardiopulmonary disease.  Chemistry panel is notable for a BUN of 36 and creatinine 1.45, very close to his apparent baseline.  CBC features a hemoglobin of 6.3, down from 8.3 earlier this month, and down from 10.1 late last month.  INR is normal.  1 unit of packed red blood cells was ordered for immediate transfusion, gastroenterology was consulted by the ED physician, and the patient was started on IV PPI and given a dose of empiric Rocephin.  He will be admitted for ongoing evaluation and management of symptomatic anemia, likely secondary to upper GI bleeding.     Assessment & Plan:   Principal Problem:   GI bleed Active Problems:   Type 2 diabetes with complication (HCC)   OSA (obstructive sleep apnea)   Coronary artery disease    Hepatic cirrhosis (HCC)   CKD (chronic kidney disease), stage III (HCC)   Symptomatic anemia   Peptic ulcer disease   Upper GI bleed  . Upper GI bleed; symptomatic anemia; PUD - Presents with a few days of melena and progressive SOB and generalized  weakness; reports mild-mod pain in epigastrium  - Found to have Hgb 6.3, down from 8.3 earlier this month and 10.1 last month  - Hemodynamically stable; no vomiting  - EGD in February '19 with normal esophagus, erosive gastropathy, and one duodenal ulcer  - GI consulting and much appreciated .  For EGD tomorrow 12/20/2017. - Currently getting second unit of blood. - Hold ASA, start IV PPI, ppx Rocephin    -Follow-up abdominal ultrasound.  2. Cirrhosis  - Appears well-compensated; no esophageal varices on EGD two months ago  - Hold diuretics while NPO, start PPI as above, monitor lytes    3. Type II DM  - A1c was 6.5% in December  - Managed at home with metformin, held on admission  - Check CBG's and use a SSI with Novolog while in hospital    4. CKD stage III  - SCr is 1.45 on admission, slightly higher than apparent baseline  - Renally-dose medications, avoid nephrotoxins    5. CAD  No anginal complaints  - Hold ASA in light of bleed, continue ARB and beta-blocker as tolerated         DVT prophylaxis:scd Code Status:full Family Communication: none Disposition Plan:tbd Consultants:  gi Procedures:none Antimicrobials: He is placed on Rocephin since admission.  I do not see a reason why he needs to be on antibiotics at this time no source of infection or evidence of infection noted.  Will DC Rocephin and observe.  Subjective:feeling hungry.had bms brown in color.  Objective: Vitals:   12/19/17  3557 12/19/17 0921 12/19/17 1219 12/19/17 1246  BP: (!) 124/56  (!) 122/34 94/77  Pulse: 66  (!) 55 (!) 56  Resp: 18  18 18   Temp: 97.8 F (36.6 C)  98.1 F (36.7 C) 98 F (36.7 C)  TempSrc: Oral  Oral Oral  SpO2: 100% 96% 100% 100%  Weight: 110.6 kg (243 lb 13.3 oz)     Height: 5\' 7"  (1.702 m)       Intake/Output Summary (Last 24 hours) at 12/19/2017 1443 Last data filed at 12/19/2017 1231 Gross per 24 hour  Intake 616 ml  Output -  Net 616 ml   Filed Weights    12/18/17 1509 12/19/17 0839  Weight: 108.9 kg (240 lb) 110.6 kg (243 lb 13.3 oz)    Examination:  General exam: Appears calm and comfortable  Respiratory system: Clear to auscultation. Respiratory effort normal. Cardiovascular system: S1 & S2 heard, RRR. No JVD, murmurs, rubs, gallops or clicks. No pedal edema. Gastrointestinal system: Abdomen is nondistended, soft and nontender. No organomegaly or masses felt. Normal bowel sounds heard. Central nervous system: Alert and oriented. No focal neurological deficits. Extremities: Symmetric 5 x 5 power. Skin: No rashes, lesions or ulcers Psychiatry: Judgement and insight appear normal. Mood & affect appropriate.     Data Reviewed: I have personally reviewed following labs and imaging studies  CBC: Recent Labs  Lab 12/18/17 1509 12/19/17 0413  WBC 4.8 4.0  HGB 6.3* 6.0*  HCT 21.2* 20.1*  MCV 82.8 83.1  PLT 162 322*   Basic Metabolic Panel: Recent Labs  Lab 12/18/17 1509 12/19/17 0413  NA 137 137  K 4.8 4.1  CL 108 112*  CO2 19* 20*  GLUCOSE 136* 96  BUN 36* 27*  CREATININE 1.45* 1.20  CALCIUM 8.4* 7.8*   GFR: Estimated Creatinine Clearance: 67 mL/min (by C-G formula based on SCr of 1.2 mg/dL). Liver Function Tests: Recent Labs  Lab 12/18/17 1509 12/19/17 0413  AST 21 19  ALT 14* 13*  ALKPHOS 85 68  BILITOT 0.5 0.5  PROT 6.7 5.4*  ALBUMIN 3.6 3.0*   No results for input(s): LIPASE, AMYLASE in the last 168 hours. No results for input(s): AMMONIA in the last 168 hours. Coagulation Profile: Recent Labs  Lab 12/18/17 1531  INR 1.08   Cardiac Enzymes: No results for input(s): CKTOTAL, CKMB, CKMBINDEX, TROPONINI in the last 168 hours. BNP (last 3 results) No results for input(s): PROBNP in the last 8760 hours. HbA1C: No results for input(s): HGBA1C in the last 72 hours. CBG: Recent Labs  Lab 12/19/17 0119 12/19/17 0410 12/19/17 0751 12/19/17 0842 12/19/17 1151  GLUCAP 138* 91 95 104* 107*   Lipid  Profile: No results for input(s): CHOL, HDL, LDLCALC, TRIG, CHOLHDL, LDLDIRECT in the last 72 hours. Thyroid Function Tests: No results for input(s): TSH, T4TOTAL, FREET4, T3FREE, THYROIDAB in the last 72 hours. Anemia Panel: No results for input(s): VITAMINB12, FOLATE, FERRITIN, TIBC, IRON, RETICCTPCT in the last 72 hours. Sepsis Labs: No results for input(s): PROCALCITON, LATICACIDVEN in the last 168 hours.  No results found for this or any previous visit (from the past 240 hour(s)).       Radiology Studies: Dg Chest 2 View  Result Date: 12/18/2017 CLINICAL DATA:  Chest pain. EXAM: CHEST - 2 VIEW COMPARISON:  Radiograph of October 01, 2017. FINDINGS: The heart size and mediastinal contours are within normal limits. Both lungs are clear. Status post coronary artery bypass graft. No pneumothorax or pleural effusion is noted.  Atherosclerosis of thoracic aorta is noted. The visualized skeletal structures are unremarkable. IMPRESSION: No active cardiopulmonary disease. Aortic Atherosclerosis (ICD10-I70.0). Electronically Signed   By: Marijo Conception, M.D.   On: 12/18/2017 16:08        Scheduled Meds: . atorvastatin  40 mg Oral q1800  . gabapentin  100 mg Oral BID  . insulin aspart  0-9 Units Subcutaneous Q4H  . losartan  50 mg Oral Daily  . metoprolol tartrate  12.5 mg Oral BID  . [START ON 12/22/2017] pantoprazole  40 mg Intravenous Q12H  . umeclidinium-vilanterol  1 puff Inhalation Daily   Continuous Infusions: . cefTRIAXone (ROCEPHIN)  IV    . pantoprozole (PROTONIX) infusion 8 mg/hr (12/19/17 0942)     LOS: 1 day      Georgette Shell, MD [facility]  If 7PM-7AM, please contact night-coverage [email or web address] Password [facility] 12/19/2017, 2:43 PM

## 2017-12-20 ENCOUNTER — Encounter (HOSPITAL_COMMUNITY): Payer: Self-pay | Admitting: *Deleted

## 2017-12-20 ENCOUNTER — Encounter (HOSPITAL_COMMUNITY)
Admission: EM | Disposition: A | Discharge status: Home Health | Payer: Self-pay | Source: Home / Self Care | Attending: Internal Medicine

## 2017-12-20 ENCOUNTER — Inpatient Hospital Stay (HOSPITAL_COMMUNITY): Payer: PPO | Admitting: Anesthesiology

## 2017-12-20 HISTORY — PX: ESOPHAGOGASTRODUODENOSCOPY (EGD) WITH PROPOFOL: SHX5813

## 2017-12-20 LAB — GLUCOSE, CAPILLARY
GLUCOSE-CAPILLARY: 130 mg/dL — AB (ref 65–99)
GLUCOSE-CAPILLARY: 98 mg/dL (ref 65–99)
Glucose-Capillary: 92 mg/dL (ref 65–99)
Glucose-Capillary: 117 mg/dL — ABNORMAL HIGH (ref 65–99)
Glucose-Capillary: 209 mg/dL — ABNORMAL HIGH (ref 65–99)
Glucose-Capillary: 149 mg/dL — ABNORMAL HIGH (ref 65–99)

## 2017-12-20 LAB — CBC WITH DIFFERENTIAL/PLATELET
BASOS PCT: 0 %
Basophils Absolute: 0 10*3/uL (ref 0.0–0.1)
EOS ABS: 0.2 10*3/uL (ref 0.0–0.7)
EOS PCT: 3 %
HCT: 25 % — ABNORMAL LOW (ref 39.0–52.0)
Hemoglobin: 7.6 g/dL — ABNORMAL LOW (ref 13.0–17.0)
LYMPHS ABS: 0.9 10*3/uL (ref 0.7–4.0)
Lymphocytes Relative: 18 %
MCH: 25.2 pg — AB (ref 26.0–34.0)
MCHC: 30.4 g/dL (ref 30.0–36.0)
MCV: 82.8 fL (ref 78.0–100.0)
MONO ABS: 0.3 10*3/uL (ref 0.1–1.0)
MONOS PCT: 7 %
Neutro Abs: 3.4 10*3/uL (ref 1.7–7.7)
Neutrophils Relative %: 72 %
PLATELETS: 129 10*3/uL — AB (ref 150–400)
RBC: 3.02 MIL/uL — ABNORMAL LOW (ref 4.22–5.81)
RDW: 16 % — AB (ref 11.5–15.5)
WBC: 4.7 10*3/uL (ref 4.0–10.5)

## 2017-12-20 LAB — BASIC METABOLIC PANEL
Anion gap: 8 (ref 5–15)
BUN: 22 mg/dL — AB (ref 6–20)
CALCIUM: 8.8 mg/dL — AB (ref 8.9–10.3)
CO2: 20 mmol/L — ABNORMAL LOW (ref 22–32)
CREATININE: 1.28 mg/dL — AB (ref 0.61–1.24)
Chloride: 106 mmol/L (ref 101–111)
GFR calc non Af Amer: 55 mL/min — ABNORMAL LOW (ref >60)
Glucose, Bld: 86 mg/dL (ref 65–99)
Potassium: 4.8 mmol/L (ref 3.5–5.1)
SODIUM: 134 mmol/L — AB (ref 135–145)

## 2017-12-20 SURGERY — ESOPHAGOGASTRODUODENOSCOPY (EGD) WITH PROPOFOL
Anesthesia: Monitor Anesthesia Care

## 2017-12-20 MED ORDER — PANTOPRAZOLE SODIUM 40 MG IV SOLR
40.0000 mg | Freq: Two times a day (BID) | INTRAVENOUS | Status: DC
  Administered 2017-12-20 – 2017-12-22 (×4): 40 mg via INTRAVENOUS
  Filled 2017-12-20 (×4): qty 40

## 2017-12-20 MED ORDER — SODIUM CHLORIDE 0.9 % IV SOLN: INTRAVENOUS | Status: DC

## 2017-12-20 MED ORDER — LACTATED RINGERS IV SOLN
INTRAVENOUS | Status: DC | PRN
  Administered 2017-12-20: 07:00:00 via INTRAVENOUS

## 2017-12-20 MED ORDER — LIDOCAINE 2% (20 MG/ML) 5 ML SYRINGE
INTRAMUSCULAR | Status: DC | PRN
  Administered 2017-12-20: 40 mg via INTRAVENOUS

## 2017-12-20 MED ORDER — EPHEDRINE SULFATE-NACL 50-0.9 MG/10ML-% IV SOSY
PREFILLED_SYRINGE | INTRAVENOUS | Status: DC | PRN
  Administered 2017-12-20 (×2): 5 mg via INTRAVENOUS

## 2017-12-20 MED ORDER — PROPOFOL 10 MG/ML IV BOLUS
INTRAVENOUS | Status: DC | PRN
  Administered 2017-12-20 (×5): 20 mg via INTRAVENOUS
  Administered 2017-12-20: 30 mg via INTRAVENOUS
  Administered 2017-12-20: 70 mg via INTRAVENOUS

## 2017-12-20 MED ORDER — GLYCOPYRROLATE 0.2 MG/ML IV SOSY
PREFILLED_SYRINGE | INTRAVENOUS | Status: DC | PRN
  Administered 2017-12-20: .1 mg via INTRAVENOUS

## 2017-12-20 SURGICAL SUPPLY — 14 items

## 2017-12-20 NOTE — Progress Notes (Signed)
PROGRESS NOTE    [name]  YIA:[id number] DOB: [birth year] DOA: 12/18/2017 PCP: [name], MD  Brief Narrative:72 y.o.malewith medical history significant forhepatic cirrhosis, coronary artery disease, chronic kidney disease stage III, and type 2 diabetes mellitus, now presenting to the emergency department for evaluation of melena, epigastric pain, and progressive exertional dyspnea and generalized weakness. Patient reports that he has noted black stool for the past couple days and has experienced progressive shortness of breath and generalized weakness over this interval. He has had mild to moderate pain in the epigastrium. He denies fevers, chills, chest pain, or cough.  ED Course:Upon arrival to the ED, patient is found to be afebrile, saturating well on room air, slightly tachycardic, and blood pressure normal. EKG features a normal sinus rhythm and chest x-ray is negative for acute cardiopulmonary disease. Chemistry panel is notable for a BUN of 36 and creatinine 1.45, very close to his apparent baseline. CBC features a hemoglobin of 6.3, down from 8.3 earlier this month, and down from 10.1 late last month. INR is normal. 1 unit of packed red blood cells was ordered for immediate transfusion, gastroenterology was consulted by the ED physician, and the patient was started on IV PPI and given a dose of empiric Rocephin. He will be admitted for ongoing evaluation and management of symptomatic anemia, likely secondary to upper GI bleeding.     Assessment & Plan:   Principal Problem:   GI bleed Active Problems:   Type 2 diabetes with complication (HCC)   OSA (obstructive sleep apnea)   Coronary artery disease    Hepatic cirrhosis (HCC)   CKD (chronic kidney disease), stage III (HCC)   Symptomatic anemia   Peptic ulcer disease   Upper GI bleed  Upper GI bleed; symptomatic anemia; PUD -Presents with a few days of melena and progressive SOB and generalized  weakness; reports mild-mod pain in epigastrium -Found to have Hgb 6.3, down from 8.3 earlier this month and 10.1 last month  - Hemodynamically stable; no vomiting -EGD in February '19 with normal esophagus, erosive gastropathy, and one duodenal ulcer  - -Hold ASA -Follow-up abdominal ultrasoundRight renal stone without obstructive change.  Fatty infiltration of the liver. -EGD TODAY -normal esophagus, 5 nonbleeding angiectasis in the stomach treated with APC.  Nonbleeding duodenal diverticulum.  2.Cirrhosis -Appears well-compensated; no esophageal varices on EGD two months ago -Hold diuretics while NPO, start PPI as above, monitor lytes  3.Type II DM -A1c was 6.5% in December -Managed at home with metformin, held on admission -Check CBG's and use a SSI with Novolog while in hospital  4.CKD stage III -SCr is 1.45 on admission, slightly higher than apparent baseline -Renally-dose medications, avoid nephrotoxins  5.CAD No anginal complaints -Hold ASA in light of bleed, continue ARB and beta-blocker as tolerated        DVT prophylaxis: scd Code Status:full Family Communication:none Disposition Plan:tbd Consultants: gi  Procedures: egd 12/20/2017 Antimicrobials: none Subjective:no complaints   Objective: Vitals:   12/20/17 0830 12/20/17 0840 12/20/17 1023 12/20/17 1047  BP: (!) 102/45 (!) 115/42  (!) 121/52  Pulse: (!) 56 (!) 55 (!) 50 62  Resp: 20 18  18   Temp:    97.6 F (36.4 C)  TempSrc:    Oral  SpO2: 97% 98%  99%  Weight:      Height:        Intake/Output Summary (Last 24 hours) at 12/20/2017 1405 Last data filed at 12/20/2017 1027 Gross per 24 hour  Intake  1398.75 ml  Output 500 ml  Net 898.75 ml   Filed Weights   12/19/17 0839 12/19/17 2038 12/20/17 0500  Weight: 110.6 kg (243 lb 13.3 oz) 110.9 kg (244 lb 8 oz) 110.9 kg (244 lb 7.8 oz)    Examination:  General exam: Appears calm and comfortable    Respiratory system: Clear to auscultation. Respiratory effort normal. Cardiovascular system: S1 & S2 heard, RRR. No JVD, murmurs, rubs, gallops or clicks. No pedal edema. Gastrointestinal system: Abdomen is nondistended, soft and nontender. No organomegaly or masses felt. Normal bowel sounds heard. Central nervous system: Alert and oriented. No focal neurological deficits. Extremities: Symmetric 5 x 5 power. Skin: No rashes, lesions or ulcers Psychiatry: Judgement and insight appear normal. Mood & affect appropriate.     Data Reviewed: I have personally reviewed following labs and imaging studies  CBC: Recent Labs  Lab 12/18/17 1509 12/19/17 0413 12/20/17 0449  WBC 4.8 4.0 4.7  NEUTROABS  --   --  3.4  HGB 6.3* 6.0* 7.6*  HCT 21.2* 20.1* 25.0*  MCV 82.8 83.1 82.8  PLT 162 124* 774*   Basic Metabolic Panel: Recent Labs  Lab 12/18/17 1509 12/19/17 0413 12/20/17 0449  NA 137 137 134*  K 4.8 4.1 4.8  CL 108 112* 106  CO2 19* 20* 20*  GLUCOSE 136* 96 86  BUN 36* 27* 22*  CREATININE 1.45* 1.20 1.28*  CALCIUM 8.4* 7.8* 8.8*   GFR: Estimated Creatinine Clearance: 62.9 mL/min (A) (by C-G formula based on SCr of 1.28 mg/dL (H)). Liver Function Tests: Recent Labs  Lab 12/18/17 1509 12/19/17 0413  AST 21 19  ALT 14* 13*  ALKPHOS 85 68  BILITOT 0.5 0.5  PROT 6.7 5.4*  ALBUMIN 3.6 3.0*   No results for input(s): LIPASE, AMYLASE in the last 168 hours. No results for input(s): AMMONIA in the last 168 hours. Coagulation Profile: Recent Labs  Lab 12/18/17 1531  INR 1.08   Cardiac Enzymes: No results for input(s): CKTOTAL, CKMB, CKMBINDEX, TROPONINI in the last 168 hours. BNP (last 3 results) No results for input(s): PROBNP in the last 8760 hours. HbA1C: No results for input(s): HGBA1C in the last 72 hours. CBG: Recent Labs  Lab 12/19/17 2031 12/20/17 0016 12/20/17 0432 12/20/17 0859 12/20/17 1154  GLUCAP 163* 98 92 117* 209*   Lipid Profile: No results  for input(s): CHOL, HDL, LDLCALC, TRIG, CHOLHDL, LDLDIRECT in the last 72 hours. Thyroid Function Tests: No results for input(s): TSH, T4TOTAL, FREET4, T3FREE, THYROIDAB in the last 72 hours. Anemia Panel: No results for input(s): VITAMINB12, FOLATE, FERRITIN, TIBC, IRON, RETICCTPCT in the last 72 hours. Sepsis Labs: No results for input(s): PROCALCITON, LATICACIDVEN in the last 168 hours.  No results found for this or any previous visit (from the past 240 hour(s)).       Radiology Studies: Dg Chest 2 View  Result Date: 12/18/2017 CLINICAL DATA:  Chest pain. EXAM: CHEST - 2 VIEW COMPARISON:  Radiograph of October 01, 2017. FINDINGS: The heart size and mediastinal contours are within normal limits. Both lungs are clear. Status post coronary artery bypass graft. No pneumothorax or pleural effusion is noted. Atherosclerosis of thoracic aorta is noted. The visualized skeletal structures are unremarkable. IMPRESSION: No active cardiopulmonary disease. Aortic Atherosclerosis (ICD10-I70.0). Electronically Signed   By: Marijo Conception, M.D.   On: 12/18/2017 16:08   US Abdomen Limited Ruq  Result Date: 12/19/2017 CLINICAL DATA:  Cirrhosis EXAM: ULTRASOUND ABDOMEN LIMITED RIGHT UPPER QUADRANT COMPARISON:  05/16/2004  FINDINGS: Gallbladder: No gallstones or wall thickening visualized. Some mild gallbladder sludge is noted. No sonographic Murphy sign noted by sonographer. Common bile duct: Diameter: 4 mm Liver: Mild increase in echogenicity within the liver likely related to fatty infiltration. No focal mass lesion is noted. Portal vein is patent on color Doppler imaging with normal direction of blood flow towards the liver. Incidental note is made of a right renal stone measuring approximately 11 mm. This has increased in size from the prior exam. No obstructive changes are noted. IMPRESSION: Right renal stone without obstructive change. Fatty infiltration of the liver. Electronically Signed   By: Inez Catalina M.D.   On: 12/19/2017 18:47        Scheduled Meds: . atorvastatin  40 mg Oral q1800  . gabapentin  100 mg Oral BID  . insulin aspart  0-9 Units Subcutaneous Q4H  . losartan  50 mg Oral Daily  . metoprolol tartrate  12.5 mg Oral BID  . [START ON 12/22/2017] pantoprazole  40 mg Intravenous Q12H  . umeclidinium-vilanterol  1 puff Inhalation Daily   Continuous Infusions: . pantoprozole (PROTONIX) infusion 8 mg/hr (12/19/17 2358)     LOS: 2 days      Georgette Shell, MD [facility]istsIf 7PM-7AM, please contact night-coverage [email or web address] Password [email or web address] 12/20/2017, 2:05 PM

## 2017-12-20 NOTE — Transfer of Care (Signed)
Immediate Anesthesia Transfer of Care Note  Patient: [name]  Procedure(s) Performed: ESOPHAGOGASTRODUODENOSCOPY (EGD) WITH PROPOFOL (N/A )  Patient Location: Endoscopy Unit  Anesthesia Type:MAC  Level of Consciousness: awake, oriented and patient cooperative  Airway & Oxygen Therapy: Patient Spontanous Breathing and Patient connected to nasal cannula oxygen  Post-op Assessment: Report given to RN and Post -op Vital signs reviewed and stable  Post vital signs: Reviewed and stable  Last Vitals:  Vitals Value Taken Time  BP    Temp    Pulse 58 12/20/2017  8:18 AM  Resp 17 12/20/2017  8:18 AM  SpO2 100 % 12/20/2017  8:18 AM  Vitals shown include unvalidated device data.  Last Pain:  Vitals:   12/20/17 0712  TempSrc: Oral  PainSc: 0-No pain         Complications: No apparent anesthesia complications

## 2017-12-20 NOTE — Anesthesia Postprocedure Evaluation (Signed)
Anesthesia Post Note  Patient: [name]  Procedure(s) Performed: ESOPHAGOGASTRODUODENOSCOPY (EGD) WITH PROPOFOL (N/A )     Patient location during evaluation: PACU Anesthesia Type: MAC Level of consciousness: awake and alert Pain management: pain level controlled Vital Signs Assessment: post-procedure vital signs reviewed and stable Respiratory status: spontaneous breathing, nonlabored ventilation and respiratory function stable Cardiovascular status: stable and blood pressure returned to baseline Anesthetic complications: no    Last Vitals:  Vitals:   12/20/17 0830 12/20/17 0840  BP: (!) 102/45 (!) 115/42  Pulse: (!) 56 (!) 55  Resp: 20 18  Temp:    SpO2: 97% 98%    Last Pain:  Vitals:   12/20/17 0712  TempSrc: Oral  PainSc: 0-No pain                 Audry Pili

## 2017-12-20 NOTE — Anesthesia Procedure Notes (Signed)
Procedure Name: MAC Date/Time: 12/20/2017 8:02 AM Performed by: Orlie Dakin, CRNA Pre-anesthesia Checklist: Patient identified, Emergency Drugs available, Suction available, Patient being monitored and Timeout performed Patient Re-evaluated:Patient Re-evaluated prior to induction Oxygen Delivery Method: Nasal cannula Preoxygenation: Pre-oxygenation with 100% oxygen Induction Type: IV induction

## 2017-12-20 NOTE — Op Note (Signed)
[facility] Patient Name: [name] Procedure Date : 12/20/2017 MRN: [id number] Attending MD: Clarene Essex , MD Date of Birth: 1946/01/13 CSN: [id number] Age: 72 Admit Type: Inpatient Procedure:                Upper GI endoscopy Indications:              Iron deficiency anemia secondary to chronic blood                            loss, Personal history of peptic ulcer disease Providers:                Clarene Essex, MD, Cleda Daub, RN, Alan Mulder,                            Technician Referring MD:              Medicines:                Propofol total dose 200 mg IV,40 mg lidocaine 0.1                            mg Robinul Complications:            No immediate complications. Estimated Blood Loss:     Estimated blood loss: none. Procedure:                Pre-Anesthesia Assessment:                           - Prior to the procedure, a History and Physical                            was performed, and patient medications and                            allergies were reviewed. The patient's tolerance of                            previous anesthesia was also reviewed. The risks                            and benefits of the procedure and the sedation                            options and risks were discussed with the patient.                            All questions were answered, and informed consent                            was obtained. Prior Anticoagulants: The patient has                            taken aspirin, last dose was 2 days prior to  procedure. ASA Grade Assessment: II - A patient                            with mild systemic disease. After reviewing the                            risks and benefits, the patient was deemed in                            satisfactory condition to undergo the procedure.                           After obtaining informed consent, the endoscope was                            passed under direct  vision. Throughout the                            procedure, the patient's blood pressure, pulse, and                            oxygen saturations were monitored continuously. The                            [id number] ([id number]) scope was introduced through the                            mouth, and advanced to the third part of duodenum.                            The upper GI endoscopy was accomplished without                            difficulty. The patient tolerated the procedure                            well. Scope In: Scope Out: Findings:      The larynx was normal.      The examined esophagus was normal.      Five small no bleeding angioectasias were found on the greater curvature       of the stomach and on the lesser curvature of the stomach. Fulguration       to ablate the lesion to prevent bleeding by argon plasma at 1       liter/minute and 20 watts was successful.      A large non-bleeding diverticulum was found in the third portion of the       duodenum.      The exam was otherwise without abnormality. Impression:               - Normal larynx.                           - Normal esophagus.                           -  Five non-bleeding angioectasias in the stomach.                            Treated with argon plasma coagulation (APC).                           - Non-bleeding duodenal diverticulum.                           - The examination was otherwise normal.                           - No specimens collected. Recommendation:           - Patient has a contact number available for                            emergencies. The signs and symptoms of potential                            delayed complications were discussed with the                            patient. Return to normal activities tomorrow.                            Written discharge instructions were provided to the                            patient.                           - Soft diet today.                            - Continue present medications.                           - Return to GI clinic in 2 weeks.                           - Telephone GI clinic if symptomatic PRN.                           - Perform a colonoscopy at appointment to be                            scheduled as an outpatient since he is almost due                            for his 5 year recheck. Procedure Code(s):        --- Professional ---                           [phone], Esophagogastroduodenoscopy, flexible,  transoral; with control of bleeding, any method Diagnosis Code(s):        --- Professional ---                           K31.819, Angiodysplasia of stomach and duodenum                            without bleeding                           D50.0, Iron deficiency anemia secondary to blood                            loss (chronic)                           Z87.11, Personal history of peptic ulcer disease                           K57.10, Diverticulosis of small intestine without                            perforation or abscess without bleeding CPT copyright 2017 [employer]. All rights reserved. The codes documented in this report are preliminary and upon coder review may  be revised to meet current compliance requirements. Clarene Essex, MD 12/20/2017 8:25:47 AM This report has been signed electronically. Number of Addenda: 0

## 2017-12-20 NOTE — Progress Notes (Signed)
[name] 7:55 AM  Subjective: Patient without any GI complaints and no signs of bleeding and case discussed with my partner Dr. Jacinto Reap and his hospital computer chart reviewed as well as our office computer chart and his previous endoscopy reviewed  Objective: Vital signs stable afebrile no acute distress exam please see preassessment evaluation hemoglobin increased to 7.6  Assessment: Guaiac positive anemia in patient with history of ulcers  Plan: Okay to proceed with endoscopy today with anesthesia assistance  [facility] E  Pager [phone] After 5PM or if no answer call [phone]

## 2017-12-21 LAB — CBC
HCT: 23.3 % — ABNORMAL LOW (ref 39.0–52.0)
HEMOGLOBIN: 7.1 g/dL — AB (ref 13.0–17.0)
MCH: 25.2 pg — AB (ref 26.0–34.0)
MCHC: 30.5 g/dL (ref 30.0–36.0)
MCV: 82.6 fL (ref 78.0–100.0)
Platelets: 109 10*3/uL — ABNORMAL LOW (ref 150–400)
RBC: 2.82 MIL/uL — ABNORMAL LOW (ref 4.22–5.81)
RDW: 16.1 % — ABNORMAL HIGH (ref 11.5–15.5)
WBC: 4.2 10*3/uL (ref 4.0–10.5)

## 2017-12-21 LAB — GLUCOSE, CAPILLARY
GLUCOSE-CAPILLARY: 130 mg/dL — AB (ref 65–99)
GLUCOSE-CAPILLARY: 137 mg/dL — AB (ref 65–99)
GLUCOSE-CAPILLARY: 150 mg/dL — AB (ref 65–99)
Glucose-Capillary: 126 mg/dL — ABNORMAL HIGH (ref 65–99)
Glucose-Capillary: 119 mg/dL — ABNORMAL HIGH (ref 65–99)
Glucose-Capillary: 175 mg/dL — ABNORMAL HIGH (ref 65–99)

## 2017-12-21 LAB — PREPARE RBC (CROSSMATCH)

## 2017-12-21 MED ORDER — SODIUM CHLORIDE 0.9 % IV SOLN
Freq: Once | INTRAVENOUS | Status: AC
  Administered 2017-12-21: 15:00:00 via INTRAVENOUS

## 2017-12-21 NOTE — Final Progress Note (Signed)
[name] 11:59 AM  Subjective: Patient with a little sore throat from his endoscopy but otherwise no complaints and no signs of bleeding  Objective: Vital signs stable afebrile no acute distress abdomen is soft nontender hemoglobin little low  Assessment: seemingly resolved GI blood loss  Plan: Questionable further transfusion per primary team and he will follow-up with either myself or Dr. Penelope Coop in one or 2 weeks to set up outpatient colonoscopy and I discussed that with the patient again  [facility] E  Pager [phone] After 5PM or if no answer call [phone]

## 2017-12-21 NOTE — Progress Notes (Signed)
PROGRESS NOTE    [name]  XYI:[id number] DOB: 04/26/1946 DOA: 12/18/2017 PCP: [name], MD  Brief Ivor Costa y.o.malewith medical history significant forhepatic cirrhosis, coronary artery disease, chronic kidney disease stage III, and type 2 diabetes mellitus, now presenting to the emergency department for evaluation of melena, epigastric pain, and progressive exertional dyspnea and generalized weakness. Patient reports that he has noted black stool for the past couple days and has experienced progressive shortness of breath and generalized weakness over this interval. He has had mild to moderate pain in the epigastrium. He denies fevers, chills, chest pain, or cough.  ED Course:Upon arrival to the ED, patient is found to be afebrile, saturating well on room air, slightly tachycardic, and blood pressure normal. EKG features a normal sinus rhythm and chest x-ray is negative for acute cardiopulmonary disease. Chemistry panel is notable for a BUN of 36 and creatinine 1.45, very close to his apparent baseline. CBC features a hemoglobin of 6.3, down from 8.3 earlier this month, and down from 10.1 late last month. INR is normal. 1 unit of packed red blood cells was ordered for immediate transfusion, gastroenterology was consulted by the ED physician, and the patient was started on IV PPI and given a dose of empiric Rocephin. He will be admitted for ongoing evaluation and management of symptomatic anemia, likely secondary to upper GI bleeding.     Assessment & Plan:   Principal Problem:   GI bleed Active Problems:   Type 2 diabetes with complication (HCC)   OSA (obstructive sleep apnea)   Coronary artery disease    Hepatic cirrhosis (HCC)   CKD (chronic kidney disease), stage III (HCC)   Symptomatic anemia   Peptic ulcer disease   Upper GI bleed  1]Upper GI bleed; symptomatic anemia; PUD -Presents with a few days of melena and progressive SOB and generalized  weakness; reports mild-mod pain in epigastrium -Found to have Hgb 6.3, down from 8.3 earlier this month and 10.1 last month  - Hemodynamically stable; no vomiting -EGD in February '19 with normal esophagus, erosive gastropathy, and one duodenal ulcer  - -Hold ASA -Follow-up abdominal ultrasoundRight renal stone without obstructive change.  Fatty infiltration of the liver. -EGD TODAY -normal esophagus, 5 nonbleeding angiectasis in the stomach treated with APC.  Nonbleeding duodenal diverticulum. -Hemoglobin still low at 7.1 after 2 units of blood transfusion.  We will transfuse another unit today follow-up H&H tomorrow and discharge home tomorrow if stable.  2.Cirrhosis -Appears well-compensated; no esophageal varices on EGD two months ago -Hold diuretics while NPO, start PPI as above, monitor lytes 3.Type II DM -A1c was 6.5% in December -Managed at home with metformin, held on admission -Check CBG's and use a SSI with Novolog while in hospital  4.CKD stage III -SCr is 1.45 on admission, slightly higher than apparent baseline -Renally-dose medications, avoid nephrotoxins  5.CAD/brady/soft bp-dc metoprolol. No anginal complaints -Hold ASA in light of bleed, continue ARB.          DVT prophylaxis:scd Code Status: full Family Communication: none Disposition Plan:  Dc in am if stable  Consultants:  gi Procedures:egd Antimicrobials:none  Subjective: Complains of feeling slightly lightheaded when standing up.  Has had bowel movements brown  to greenish in color  Objective: Vitals:   12/20/17 2051 12/21/17 0142 12/21/17 0425 12/21/17 0951  BP: (!) 119/55  (!) 122/51 113/60  Pulse: (!) 57  (!) 52 (!) 52  Resp: 18  16 18   Temp: 98 F (36.7 C)  98.6  F (37 C) 97.7 F (36.5 C)  TempSrc: Oral   Oral  SpO2: 98%  99% 100%  Weight:  110.9 kg (244 lb 7.8 oz)    Height:        Intake/Output Summary (Last 24 hours) at 12/21/2017  1416 Last data filed at 12/21/2017 1005 Gross per 24 hour  Intake 1519.58 ml  Output 0 ml  Net 1519.58 ml   Filed Weights   12/19/17 2038 12/20/17 0500 12/21/17 0142  Weight: 110.9 kg (244 lb 8 oz) 110.9 kg (244 lb 7.8 oz) 110.9 kg (244 lb 7.8 oz)    Examination:  General exam: Appears calm and comfortable  Respiratory system: Clear to auscultation. Respiratory effort normal. Cardiovascular system: S1 & S2 heard, RRR. No JVD, murmurs, rubs, gallops or clicks. No pedal edema. Gastrointestinal system: Abdomen is nondistended, soft and nontender. No organomegaly or masses felt. Normal bowel sounds heard. Central nervous system: Alert and oriented. No focal neurological deficits. Extremities: Symmetric 5 x 5 power. Skin: No rashes, lesions or ulcers Psychiatry: Judgement and insight appear normal. Mood & affect appropriate.     Data Reviewed: I have personally reviewed following labs and imaging studies  CBC: Recent Labs  Lab 12/18/17 1509 12/19/17 0413 12/20/17 0449 12/21/17 0711  WBC 4.8 4.0 4.7 4.2  NEUTROABS  --   --  3.4  --   HGB 6.3* 6.0* 7.6* 7.1*  HCT 21.2* 20.1* 25.0* 23.3*  MCV 82.8 83.1 82.8 82.6  PLT 162 124* 129* 235*   Basic Metabolic Panel: Recent Labs  Lab 12/18/17 1509 12/19/17 0413 12/20/17 0449  NA 137 137 134*  K 4.8 4.1 4.8  CL 108 112* 106  CO2 19* 20* 20*  GLUCOSE 136* 96 86  BUN 36* 27* 22*  CREATININE 1.45* 1.20 1.28*  CALCIUM 8.4* 7.8* 8.8*   GFR: Estimated Creatinine Clearance: 62.9 mL/min (A) (by C-G formula based on SCr of 1.28 mg/dL (H)). Liver Function Tests: Recent Labs  Lab 12/18/17 1509 12/19/17 0413  AST 21 19  ALT 14* 13*  ALKPHOS 85 68  BILITOT 0.5 0.5  PROT 6.7 5.4*  ALBUMIN 3.6 3.0*   No results for input(s): LIPASE, AMYLASE in the last 168 hours. No results for input(s): AMMONIA in the last 168 hours. Coagulation Profile: Recent Labs  Lab 12/18/17 1531  INR 1.08   Cardiac Enzymes: No results for  input(s): CKTOTAL, CKMB, CKMBINDEX, TROPONINI in the last 168 hours. BNP (last 3 results) No results for input(s): PROBNP in the last 8760 hours. HbA1C: No results for input(s): HGBA1C in the last 72 hours. CBG: Recent Labs  Lab 12/20/17 2050 12/21/17 0009 12/21/17 0425 12/21/17 0735 12/21/17 1215  GLUCAP 149* 150* 126* 130* 119*   Lipid Profile: No results for input(s): CHOL, HDL, LDLCALC, TRIG, CHOLHDL, LDLDIRECT in the last 72 hours. Thyroid Function Tests: No results for input(s): TSH, T4TOTAL, FREET4, T3FREE, THYROIDAB in the last 72 hours. Anemia Panel: No results for input(s): VITAMINB12, FOLATE, FERRITIN, TIBC, IRON, RETICCTPCT in the last 72 hours. Sepsis Labs: No results for input(s): PROCALCITON, LATICACIDVEN in the last 168 hours.  No results found for this or any previous visit (from the past 240 hour(s)).       Radiology Studies: US Abdomen Limited Ruq  Result Date: 12/19/2017 CLINICAL DATA:  Cirrhosis EXAM: ULTRASOUND ABDOMEN LIMITED RIGHT UPPER QUADRANT COMPARISON:  05/16/2004 FINDINGS: Gallbladder: No gallstones or wall thickening visualized. Some mild gallbladder sludge is noted. No sonographic Murphy sign noted by sonographer. Common  bile duct: Diameter: 4 mm Liver: Mild increase in echogenicity within the liver likely related to fatty infiltration. No focal mass lesion is noted. Portal vein is patent on color Doppler imaging with normal direction of blood flow towards the liver. Incidental note is made of a right renal stone measuring approximately 11 mm. This has increased in size from the prior exam. No obstructive changes are noted. IMPRESSION: Right renal stone without obstructive change. Fatty infiltration of the liver. Electronically Signed   By: Inez Catalina M.D.   On: 12/19/2017 18:47        Scheduled Meds: . atorvastatin  40 mg Oral q1800  . gabapentin  100 mg Oral BID  . insulin aspart  0-9 Units Subcutaneous Q4H  . losartan  50 mg Oral Daily   . metoprolol tartrate  12.5 mg Oral BID  . pantoprazole  40 mg Intravenous Q12H  . umeclidinium-vilanterol  1 puff Inhalation Daily   Continuous Infusions: . sodium chloride       LOS: 3 days      Georgette Shell, MD [facility] If 7PM-7AM, please contact night-coverage [email or web address] Password [email or web address] 12/21/2017, 2:16 PM

## 2017-12-22 ENCOUNTER — Telehealth: Payer: Self-pay | Admitting: *Deleted

## 2017-12-22 LAB — TYPE AND SCREEN
ABO/RH(D): O POS
ANTIBODY SCREEN: NEGATIVE
UNIT DIVISION: 0
UNIT DIVISION: 0
UNIT DIVISION: 0
Unit division: 0

## 2017-12-22 LAB — CBC WITH DIFFERENTIAL/PLATELET
Basophils Absolute: 0 10*3/uL (ref 0.0–0.1)
Basophils Relative: 0 %
EOS ABS: 0.2 10*3/uL (ref 0.0–0.7)
EOS PCT: 4 %
HCT: 26.7 % — ABNORMAL LOW (ref 39.0–52.0)
Hemoglobin: 8.1 g/dL — ABNORMAL LOW (ref 13.0–17.0)
LYMPHS ABS: 0.7 10*3/uL (ref 0.7–4.0)
Lymphocytes Relative: 15 %
MCH: 24.8 pg — AB (ref 26.0–34.0)
MCHC: 30.3 g/dL (ref 30.0–36.0)
MCV: 81.7 fL (ref 78.0–100.0)
MONO ABS: 0.4 10*3/uL (ref 0.1–1.0)
Monocytes Relative: 8 %
Neutro Abs: 3.7 10*3/uL (ref 1.7–7.7)
Neutrophils Relative %: 73 %
PLATELETS: 123 10*3/uL — AB (ref 150–400)
RBC: 3.27 MIL/uL — AB (ref 4.22–5.81)
RDW: 15.7 % — AB (ref 11.5–15.5)
WBC: 5 10*3/uL (ref 4.0–10.5)

## 2017-12-22 LAB — BPAM RBC
BLOOD PRODUCT EXPIRATION DATE: 201905092359
BLOOD PRODUCT EXPIRATION DATE: 201905172359
BLOOD PRODUCT EXPIRATION DATE: 201905172359
Blood Product Expiration Date: 201905172359
ISSUE DATE / TIME: 201904231226
ISSUE DATE / TIME: 201904251627
ISSUE DATE / TIME: 201904222039
UNIT TYPE AND RH: 5100
Unit Type and Rh: 5100
Unit Type and Rh: 5100
Unit Type and Rh: 5100

## 2017-12-22 LAB — GLUCOSE, CAPILLARY
GLUCOSE-CAPILLARY: 103 mg/dL — AB (ref 65–99)
Glucose-Capillary: 109 mg/dL — ABNORMAL HIGH (ref 65–99)
Glucose-Capillary: 106 mg/dL — ABNORMAL HIGH (ref 65–99)

## 2017-12-22 MED ORDER — POTASSIUM CHLORIDE 10 MEQ/100ML IV SOLN: 10.0000 meq | INTRAVENOUS | Status: DC

## 2017-12-22 MED ORDER — PANTOPRAZOLE SODIUM 40 MG PO TBEC: 40.0000 mg | DELAYED_RELEASE_TABLET | Freq: Two times a day (BID) | ORAL | 1 refills | Status: DC

## 2017-12-22 NOTE — Telephone Encounter (Signed)
Pt was on TCM report admitted for 12/18/17 for Shortness of Breath. This is a new problem. The problem occurs intermittently.The current episode started 2 days ago. The problem has been gradually worsening. Associated symptoms include abdominal pain (epigastric) and leg swelling. Pt had a ESOPHAGOGASTRODUODENOSCOPY (EGD on 12/22/17. Pt d/cd will f/u w/dr. Watt Climes.Marland KitchenJohny Chess

## 2017-12-22 NOTE — Progress Notes (Signed)
   12/22/17 1000  Clinical Encounter Type  Visited With Patient  Visit Type Initial  Referral From Nurse  Consult/Referral To Chaplain  Spiritual Encounters  Spiritual Needs Emotional  Stress Factors  Patient Stress Factors Exhausted    Pt was on the hall way after seeking discharge information from [facility]. Chaplain walked with Pt back to his room. Pt ad chaplain had good conversation and Pt was excited to be looking forward to going home. Chaplain provided emotional support through reflective listening and compassionate presence.  Hadley Detloff a Medical sales representative, [employer]

## 2017-12-22 NOTE — Discharge Summary (Signed)
Physician Discharge Summary  [name] FVC:[id number] DOB: 09-10-1945 DOA: 12/18/2017  PCP: [name], MD  Admit date: 12/18/2017 Discharge date: 12/22/2017  Admitted From: Home Disposition: Home Recommendations for Outpatient Follow-up:  1. Follow up with PCP in 1-2 weeks 2. Please obtain BMP/CBC in one week Home Health advanced home health RN Equipment/Devices: None Discharge Condition: Stable CODE STATUS: Full code Diet recommendation: Cardiac diet  Brief/Interim Summary: 72 y.o.malewith medical history significant forhepatic cirrhosis, coronary artery disease, chronic kidney disease stage III, and type 2 diabetes mellitus, now presenting to the emergency department for evaluation of melena, epigastric pain, and progressive exertional dyspnea and generalized weakness. Patient reports that he has noted black stool for the past couple days and has experienced progressive shortness of breath and generalized weakness over this interval. He has had mild to moderate pain in the epigastrium. He denies fevers, chills, chest pain, or cough.  ED Course:Upon arrival to the ED, patient is found to be afebrile, saturating well on room air, slightly tachycardic, and blood pressure normal. EKG features a normal sinus rhythm and chest x-ray is negative for acute cardiopulmonary disease. Chemistry panel is notable for a BUN of 36 and creatinine 1.45, very close to his apparent baseline. CBC features a hemoglobin of 6.3, down from 8.3 earlier this month, and down from 10.1 late last month. INR is normal. 1 unit of packed red blood cells was ordered for immediate transfusion, gastroenterology was consulted by the ED physician, and the patient was started on IV PPI and given a dose of empiric Rocephin. He will be admitted for ongoing evaluation and management of symptomatic anemia, likely secondary to upper GI bleeding.    Discharge Diagnoses:  Principal Problem:   GI  bleed Active Problems:   Type 2 diabetes with complication (HCC)   OSA (obstructive sleep apnea)   Coronary artery disease    Hepatic cirrhosis (HCC)   CKD (chronic kidney disease), stage III (HCC)   Symptomatic anemia   Peptic ulcer disease   Upper GI bleed 1]Upper GI bleed; symptomatic anemia; PUD -Presents with a few days of melena and progressive SOB and generalized weakness; reports mild-mod pain in epigastrium -Found to have Hgb 6.3, down from 8.3 earlier this month and 10.1 last month  - Hemodynamically stable; no vomiting -EGD in February '19 with normal esophagus, erosive gastropathy, and one duodenal ulcer  -Aspirin has been on hold during this hospital stay and will be stopped on discharge.  Patient will follow-up with GI Dr. Vernice Jefferson and decide when he can restart the  aspirin.  Patient had a total of 3 units of blood transfusion. - abdominal ultrasoundRight renal stone without obstructive change.  Fatty infiltration of the liver. -EGD TODAY -normal esophagus, 5 nonbleeding angiectasis in the stomach treated with APC. Nonbleeding duodenal diverticulum. -Hemoglobin still low at 7.1 after 2 units of blood transfusion.   2.Cirrhosis -Appears well-compensated; no esophageal varices on EGD two months ago -Hold diuretics while NPO, start PPI as above, monitor lytes 3.Type II DM -A1c was 6.5% in December -Managed at home with metformin start metformin.  4.CKD stage III -SCr is 1.45 on admission, slightly higher than apparent baseline  5.CAD/brady/soft bp-dc metoprolol. No anginal complaints -Hold ASA in light of bleed, continue ARB.  6 hypertension please note that during this hospital stay his blood pressure has been low to soft so all the antihypertensives were on hold including Lasix, Aldactone, metoprolol, angiotensin receptor blocker.  At this time of restarting angiotensin receptor  blocker patient should follow-up with his PCP in within the 1  to 2 weeks and restart antihypertensives as needed.        Discharge Instructions  Discharge Instructions    Call MD for:  difficulty breathing, headache or visual disturbances   Complete by:  As directed    Call MD for:  persistant dizziness or light-headedness   Complete by:  As directed    Call MD for:  persistant nausea and vomiting   Complete by:  As directed    Call MD for:  severe uncontrolled pain   Complete by:  As directed    Call MD for:  temperature >100.4   Complete by:  As directed    Diet - low sodium heart healthy   Complete by:  As directed    Increase activity slowly   Complete by:  As directed      Allergies as of 12/22/2017   No Known Allergies     Medication List    STOP taking these medications   aspirin EC 81 MG tablet   blood glucose meter kit and supplies   furosemide 80 MG tablet Commonly known as:  LASIX   losartan 50 MG tablet Commonly known as:  COZAAR   metoprolol tartrate 25 MG tablet Commonly known as:  LOPRESSOR   ONETOUCH VERIO test strip Generic drug:  glucose blood     TAKE these medications   atorvastatin 40 MG tablet Commonly known as:  LIPITOR TAKE 1 TABLET BY MOUTH DAILY AT 6 PM   diclofenac sodium 1 % Gel Commonly known as:  VOLTAREN Apply 2 g topically 4 (four) times daily.   ferrous sulfate 325 (65 FE) MG EC tablet Take 325 mg by mouth daily.   gabapentin 100 MG capsule Commonly known as:  NEURONTIN TAKE 1 CAPSULE(100 MG) BY MOUTH TWICE DAILY What changed:  See the new instructions.   metFORMIN 850 MG tablet Commonly known as:  GLUCOPHAGE Take 850 mg by mouth 2 (two) times daily with a meal.   nitroGLYCERIN 0.4 MG SL tablet Commonly known as:  NITROSTAT Place 1 tablet (0.4 mg total) under the tongue every 5 (five) minutes as needed for chest pain.   pantoprazole 40 MG tablet Commonly known as:  PROTONIX Take 1 tablet (40 mg total) by mouth 2 (two) times daily.   polyethylene glycol  packet Commonly known as:  MIRALAX / GLYCOLAX Take 17 g by mouth 2 (two) times daily. What changed:    when to take this  reasons to take this   spironolactone 50 MG tablet Commonly known as:  ALDACTONE Take 1 tablet (50 mg total) by mouth daily.   umeclidinium-vilanterol 62.5-25 MCG/INH Aepb Commonly known as:  ANORO ELLIPTA Inhale 1 puff into the lungs daily.      Follow-up Information    [name], MD Follow up.   Specialty:  Internal Medicine Contact information: [place] [id number] [phone]        [facility] Follow [facility].   Specialty:  [place] Why:  They will call you to set up visit Contact information: [place] [address] [phone]        Clarene Essex, MD Follow up.   Specialty:  Gastroenterology Contact information: [address] [place] [address] [phone]          No Known Allergies  Consultations:  gi   Procedures/Studies: Dg Chest 2  View  Result Date: 12/18/2017 CLINICAL DATA:  Chest pain. EXAM: CHEST - 2 VIEW COMPARISON:  Radiograph of October 01, 2017. FINDINGS: The heart size and mediastinal contours are within normal limits. Both lungs are clear. Status post coronary artery bypass graft. No pneumothorax or pleural effusion is noted. Atherosclerosis of thoracic aorta is noted. The visualized skeletal structures are unremarkable. IMPRESSION: No active cardiopulmonary disease. Aortic Atherosclerosis (ICD10-I70.0). Electronically Signed   By: Marijo Conception, M.D.   On: 12/18/2017 16:08   US Abdomen Limited Ruq  Result Date: 12/19/2017 CLINICAL DATA:  Cirrhosis EXAM: ULTRASOUND ABDOMEN LIMITED RIGHT UPPER QUADRANT COMPARISON:  05/16/2004 FINDINGS: Gallbladder: No gallstones or wall thickening visualized. Some mild gallbladder sludge is noted. No sonographic Murphy sign noted by sonographer. Common bile duct: Diameter: 4 mm  Liver: Mild increase in echogenicity within the liver likely related to fatty infiltration. No focal mass lesion is noted. Portal vein is patent on color Doppler imaging with normal direction of blood flow towards the liver. Incidental note is made of a right renal stone measuring approximately 11 mm. This has increased in size from the prior exam. No obstructive changes are noted. IMPRESSION: Right renal stone without obstructive change. Fatty infiltration of the liver. Electronically Signed   By: Inez Catalina M.D.   On: 12/19/2017 18:47    (Echo, Carotid, EGD, Colonoscopy, ERCP)    Subjective:   Discharge Exam: Vitals:   12/22/17 0451 12/22/17 0828  BP: 111/60   Pulse: (!) 51   Resp: 18   Temp: 98 F (36.7 C)   SpO2: 98% 98%   Vitals:   12/22/17 0006 12/22/17 0451 12/22/17 0500 12/22/17 0828  BP: (!) 92/42 111/60    Pulse: (!) 50 (!) 51    Resp: 18 18    Temp:  98 F (36.7 C)    TempSrc:  Oral    SpO2: 100% 98%  98%  Weight:   110.9 kg (244 lb 7.8 oz)   Height:        General: Pt is alert, awake, not in acute distress Cardiovascular: RRR, S1/S2 +, no rubs, no gallops Respiratory: CTA bilaterally, no wheezing, no rhonchi Abdominal: Soft, NT, ND, bowel sounds + Extremities: no edema, no cyanosis    The results of significant diagnostics from this hospitalization (including imaging, microbiology, ancillary and laboratory) are listed below for reference.     Microbiology: No results found for this or any previous visit (from the past 240 hour(s)).   Labs: BNP (last 3 results) Recent Labs    09/02/17 1601 09/29/17 1819  BNP 197.2* 656.8*   Basic Metabolic Panel: Recent Labs  Lab 12/18/17 1509 12/19/17 0413 12/20/17 0449  NA 137 137 134*  K 4.8 4.1 4.8  CL 108 112* 106  CO2 19* 20* 20*  GLUCOSE 136* 96 86  BUN 36* 27* 22*  CREATININE 1.45* 1.20 1.28*  CALCIUM 8.4* 7.8* 8.8*   Liver Function Tests: Recent Labs  Lab 12/18/17 1509 12/19/17 0413  AST  21 19  ALT 14* 13*  ALKPHOS 85 68  BILITOT 0.5 0.5  PROT 6.7 5.4*  ALBUMIN 3.6 3.0*   No results for input(s): LIPASE, AMYLASE in the last 168 hours. No results for input(s): AMMONIA in the last 168 hours. CBC: Recent Labs  Lab 12/18/17 1509 12/19/17 0413 12/20/17 0449 12/21/17 0711 12/22/17 0522  WBC 4.8 4.0 4.7 4.2 5.0  NEUTROABS  --   --  3.4  --  3.7  HGB 6.3* 6.0*  7.6* 7.1* 8.1*  HCT 21.2* 20.1* 25.0* 23.3* 26.7*  MCV 82.8 83.1 82.8 82.6 81.7  PLT 162 124* 129* 109* 123*   Cardiac Enzymes: No results for input(s): CKTOTAL, CKMB, CKMBINDEX, TROPONINI in the last 168 hours. BNP: Invalid input(s): POCBNP CBG: Recent Labs  Lab 12/21/17 1655 12/21/17 2126 12/22/17 0125 12/22/17 0449 12/22/17 0749  GLUCAP 175* 137* 103* 109* 106*   D-Dimer No results for input(s): DDIMER in the last 72 hours. Hgb A1c No results for input(s): HGBA1C in the last 72 hours. Lipid Profile No results for input(s): CHOL, HDL, LDLCALC, TRIG, CHOLHDL, LDLDIRECT in the last 72 hours. Thyroid function studies No results for input(s): TSH, T4TOTAL, T3FREE, THYROIDAB in the last 72 hours.  Invalid input(s): FREET3 Anemia work up No results for input(s): VITAMINB12, FOLATE, FERRITIN, TIBC, IRON, RETICCTPCT in the last 72 hours. Urinalysis    Component Value Date/Time   COLORURINE YELLOW 03/29/2014 1546   APPEARANCEUR CLEAR 03/29/2014 1546   LABSPEC 1.011 03/29/2014 1546   PHURINE 6.0 03/29/2014 1546   GLUCOSEU NEGATIVE 03/29/2014 1546   HGBUR NEGATIVE 03/29/2014 1546   BILIRUBINUR NEGATIVE 03/29/2014 1546   KETONESUR NEGATIVE 03/29/2014 1546   PROTEINUR NEGATIVE 03/29/2014 1546   UROBILINOGEN 0.2 03/29/2014 1546   NITRITE NEGATIVE 03/29/2014 1546   LEUKOCYTESUR NEGATIVE 03/29/2014 1546   Sepsis Labs Invalid input(s): PROCALCITONIN,  WBC,  LACTICIDVEN Microbiology No results found for this or any previous visit (from the past 240 hour(s)).   Time coordinating discharge: Over 33  minutes  SIGNED:   Georgette Shell, MD  [facility] 12/22/2017, 10:36 AM Pager   If 7PM-7AM, please contact night-coverage [email or web address] Password [email or web address]

## 2017-12-22 NOTE — Progress Notes (Signed)
Pt after visit summary reviewed and pt capable of re verbalizing medications and follow up appointments. Pt remains stable. No signs and symptoms of distress. Educated to return to ER in the event of SOB, dizziness, chest pain, or fainting. Patient will await son for transport to home. Mady Gemma, RN

## 2017-12-22 NOTE — Progress Notes (Signed)
Verified w [facility] that [facility] services are set up for DC today. No other CM needs identified.

## 2017-12-28 ENCOUNTER — Encounter: Payer: Self-pay | Admitting: Internal Medicine

## 2017-12-28 ENCOUNTER — Other Ambulatory Visit (INDEPENDENT_AMBULATORY_CARE_PROVIDER_SITE_OTHER): Payer: PPO

## 2017-12-28 ENCOUNTER — Ambulatory Visit (INDEPENDENT_AMBULATORY_CARE_PROVIDER_SITE_OTHER): Payer: PPO | Admitting: Internal Medicine

## 2017-12-28 ENCOUNTER — Telehealth: Payer: Self-pay

## 2017-12-28 VITALS — BP 122/62 | HR 65 | Temp 98.2°F | Ht 67.0 in | Wt 256.0 lb

## 2017-12-28 DIAGNOSIS — K922 Gastrointestinal hemorrhage, unspecified: Secondary | ICD-10-CM

## 2017-12-28 DIAGNOSIS — D649 Anemia, unspecified: Secondary | ICD-10-CM

## 2017-12-28 DIAGNOSIS — I110 Hypertensive heart disease with heart failure: Secondary | ICD-10-CM

## 2017-12-28 LAB — CBC
HCT: 24.1 % — ABNORMAL LOW (ref 39.0–52.0)
Hemoglobin: 7.7 g/dL — CL (ref 13.0–17.0)
MCHC: 31.8 g/dL (ref 30.0–36.0)
MCV: 79.9 fl (ref 78.0–100.0)
Platelets: 114 10*3/uL — ABNORMAL LOW (ref 150.0–400.0)
RBC: 3.02 Mil/uL — ABNORMAL LOW (ref 4.22–5.81)
RDW: 16.8 % — ABNORMAL HIGH (ref 11.5–15.5)
WBC: 3.5 10*3/uL — ABNORMAL LOW (ref 4.0–10.5)

## 2017-12-28 LAB — BASIC METABOLIC PANEL
BUN: 21 mg/dL (ref 6–23)
CALCIUM: 8.6 mg/dL (ref 8.4–10.5)
CHLORIDE: 107 meq/L (ref 96–112)
CO2: 24 meq/L (ref 19–32)
CREATININE: 1.23 mg/dL (ref 0.40–1.50)
GFR: 61.51 mL/min (ref >60.00)
GLUCOSE: 106 mg/dL — AB (ref 70–99)
Potassium: 4.5 mEq/L (ref 3.5–5.1)
SODIUM: 138 meq/L (ref 135–145)

## 2017-12-28 NOTE — Patient Instructions (Signed)
We are checking there blood levels today and will call you back about the results.

## 2017-12-28 NOTE — Telephone Encounter (Addendum)
Noted, relatively stable. Will await rest of results but no change to plan.

## 2017-12-28 NOTE — Telephone Encounter (Signed)
CRITICAL VALUE STICKER  CRITICAL VALUE: Hgb 7.7  RECEIVER (on-site recipient of call): [place] NOTIFIED: 1:55  MESSENGER (representative from lab):  MD NOTIFIED: yes  TIME OF NOTIFICATION: 1:56  RESPONSE:

## 2017-12-28 NOTE — Assessment & Plan Note (Signed)
Symptoms are resolving and he is recovering well. Needs CBC for monitoring today and GI follow up. Avoiding ASA and nsaids otc.

## 2017-12-28 NOTE — Assessment & Plan Note (Signed)
Is over his recommended salt intake and legs are more swollen. He is advised to elevate legs, decrease salt intake.

## 2017-12-28 NOTE — Progress Notes (Signed)
   Subjective:    Patient ID: [name], male    DOB: Jan 11, 1946, 72 y.o.   MRN: [id number]  HPI The patient is a 72 YO man coming in for hospital follow up (in for GI bleeding, EGD with stomach bleeding treated with apc, given 3 units of blood, still with low blood counts on discharge). He denies melena since leaving the hospital. Denies any blood in stool or dark bowel movements. Denies taking advil, aleve, nsaids, bc, goody powders. Is off aspirin pending follow up with GI. He is on PPI now. Denies SOB or dizziness. Eating and drinking well. Denies sleeping problems. Denies chest pains. Denies diarrhea or constipation. Eating more sodium than he knows he should but breathing stable, legs are slightly more swollen than usual.   PMH, [facility], social history reviewed and updated.   Review of Systems  Constitutional: Positive for activity change. Negative for appetite change, fatigue, fever and unexpected weight change.  HENT: Negative.   Eyes: Negative.   Respiratory: Negative for cough, chest tightness and shortness of breath.   Cardiovascular: Positive for leg swelling. Negative for chest pain and palpitations.  Gastrointestinal: Negative for abdominal distention, abdominal pain, constipation, diarrhea, nausea and vomiting.  Musculoskeletal: Negative.   Skin: Negative.   Neurological: Negative.   Psychiatric/Behavioral: Negative.       Objective:   Physical Exam  Constitutional: He is oriented to person, place, and time. He appears well-developed and well-nourished.  overweight  HENT:  Head: Normocephalic and atraumatic.  Eyes: EOM are normal.  Neck: Normal range of motion.  Cardiovascular: Normal rate and regular rhythm.  Pulmonary/Chest: Effort normal and breath sounds normal. No respiratory distress. He has no wheezes. He has no rales.  Abdominal: Soft. Bowel sounds are normal. He exhibits no distension. There is no tenderness. There is no rebound.  Musculoskeletal: He exhibits  edema.  2-3+ edema bilateral legs with color change consistent with venous stasis, no signs of cellulitis.   Neurological: He is alert and oriented to person, place, and time. Coordination normal.  Skin: Skin is warm and dry.  Psychiatric: He has a normal mood and affect.   Vitals:   12/28/17 1308  BP: 122/62  Pulse: 65  Temp: 98.2 F (36.8 C)  TempSrc: Oral  SpO2: 95%  Weight: 256 lb (116.1 kg)  Height: 5\' 7"  (1.702 m)      Assessment & Plan:

## 2017-12-28 NOTE — Assessment & Plan Note (Signed)
Confirmed that he is not on any aspirin or nsaids and reinforced that. Checking CBC today and needs follow up with gi.

## 2017-12-31 ENCOUNTER — Other Ambulatory Visit: Payer: Self-pay | Admitting: Internal Medicine

## 2018-01-04 ENCOUNTER — Telehealth: Payer: Self-pay | Admitting: Internal Medicine

## 2018-01-04 NOTE — Telephone Encounter (Unsigned)
Copied from [place] [id number]. Topic: Quick Communication - See Telephone Encounter >> Jan 04, 2018  5:27 PM Neva Seat wrote: Sharee Pimple, [place] [phone]  With pt having shortness of breath, pale, ankles are 34 cm around.  She thought making an appt for tomorrow would be safe for pt.  Pt has appt at 1:40 on Fri w/ Dr. Sharlet Salina. Pt has been advised to call 911 if needed.

## 2018-01-05 ENCOUNTER — Ambulatory Visit (INDEPENDENT_AMBULATORY_CARE_PROVIDER_SITE_OTHER): Payer: PPO | Admitting: Internal Medicine

## 2018-01-05 ENCOUNTER — Other Ambulatory Visit (INDEPENDENT_AMBULATORY_CARE_PROVIDER_SITE_OTHER): Payer: PPO

## 2018-01-05 ENCOUNTER — Encounter: Payer: Self-pay | Admitting: Internal Medicine

## 2018-01-05 VITALS — BP 132/68 | HR 60 | Temp 98.0°F | Ht 67.0 in | Wt 257.0 lb

## 2018-01-05 DIAGNOSIS — I5033 Acute on chronic diastolic (congestive) heart failure: Secondary | ICD-10-CM

## 2018-01-05 DIAGNOSIS — R0602 Shortness of breath: Secondary | ICD-10-CM | POA: Diagnosis not present

## 2018-01-05 DIAGNOSIS — K7460 Unspecified cirrhosis of liver: Secondary | ICD-10-CM | POA: Diagnosis not present

## 2018-01-05 DIAGNOSIS — I110 Hypertensive heart disease with heart failure: Secondary | ICD-10-CM

## 2018-01-05 DIAGNOSIS — K922 Gastrointestinal hemorrhage, unspecified: Secondary | ICD-10-CM | POA: Diagnosis not present

## 2018-01-05 LAB — CBC
HCT: 26.6 % — ABNORMAL LOW (ref 39.0–52.0)
MCHC: 31.6 g/dL (ref 30.0–36.0)
MCV: 80.4 fl (ref 78.0–100.0)
PLATELETS: 149 10*3/uL — AB (ref 150.0–400.0)
RBC: 3.31 Mil/uL — AB (ref 4.22–5.81)
RDW: 18 % — ABNORMAL HIGH (ref 11.5–15.5)
WBC: 4 10*3/uL (ref 4.0–10.5)

## 2018-01-05 MED ORDER — FUROSEMIDE 80 MG PO TABS: 80.0000 mg | ORAL_TABLET | Freq: Two times a day (BID) | ORAL | 3 refills | Status: DC

## 2018-01-05 NOTE — Progress Notes (Signed)
   Subjective:    Patient ID: [name], male    DOB: 23-Dec-1945, 72 y.o.   MRN: [id number]  HPI The patient is a 72 YO man coming in for SOB and edema in his ankles. He is taking spironolactone without missing any doses. He has had dietary changes with going out to eat several times with foods high in sodium. He had a lot of swelling in his ankles yesterday and kept them propped up. He is coughing more and SOB with exertion. He is also having weight gain. Home health nurse noticed about 7 pounds weight gain in the last week or so. Denies chest pains. Denies abdominal swelling or pain. Denies diarrhea or constipation. Denies blood in stool. Denies black or dark stool.   Review of Systems  Constitutional: Positive for activity change, appetite change and fatigue.  HENT: Negative.   Eyes: Negative.   Respiratory: Positive for cough and shortness of breath. Negative for chest tightness.   Cardiovascular: Positive for leg swelling. Negative for chest pain and palpitations.  Gastrointestinal: Positive for abdominal distention. Negative for abdominal pain, constipation, diarrhea, nausea and vomiting.       Stable from prior  Musculoskeletal: Negative.   Skin: Negative.   Neurological: Negative.   Psychiatric/Behavioral: Negative.       Objective:   Physical Exam  Constitutional: He is oriented to person, place, and time. He appears well-developed and well-nourished.  Appears in mild distress  HENT:  Head: Normocephalic and atraumatic.  Eyes: EOM are normal.  Neck: Normal range of motion.  Cardiovascular: Normal rate and regular rhythm.  Pulmonary/Chest: Effort normal and breath sounds normal. No respiratory distress. He has no wheezes. He has no rales.  Some rales in the bases bilaterally. Some dyspnea with exertion but able to carry on conversation.  Abdominal: Soft. Bowel sounds are normal. He exhibits no distension. There is no tenderness. There is no rebound.  Musculoskeletal:    Right lower leg: He exhibits edema.  3+ pitting edema to knees bilaterally.   Neurological: He is alert and oriented to person, place, and time. Coordination normal.  Skin: Skin is warm and dry.  Psychiatric: He has a normal mood and affect.   Vitals:   01/05/18 1353  BP: 132/68  Pulse: 60  Temp: 98 F (36.7 C)  TempSrc: Oral  SpO2: 97%  Weight: 257 lb (116.6 kg)  Height: 5\' 7"  (1.702 m)      Assessment & Plan:

## 2018-01-05 NOTE — Telephone Encounter (Signed)
FYI

## 2018-01-05 NOTE — Assessment & Plan Note (Signed)
Suspect from volume overload with salt intake and weight increase. Ankles more swollen and some rales on exam. Rx for lasix 80 mg BID for 5 days then patient will call with update. If worsening or no improvement over the weekend they will seek ER for care and possibly IV diuretics.

## 2018-01-05 NOTE — Patient Instructions (Addendum)
We will have you take the lasix 80 mg you have at home. Take 1 pill today, then starting tomorrow take 1 pill twice a day for 5 days.   Call us back about the weight after the 5 days for instructions.   We are checking the labs today.

## 2018-01-05 NOTE — Assessment & Plan Note (Signed)
No signs of worsening ascites or infection peritoneum on exam today. Taking spironolactone. He is not compliant with dietary salt intake.

## 2018-01-05 NOTE — Assessment & Plan Note (Signed)
Needs CBC recheck to evaluate for worsening anemia. Previous last week with stable but mildly lower levels. Suspect SOB from volume overload but could be from anemia.

## 2018-01-05 NOTE — Assessment & Plan Note (Signed)
Heart failure is exacerbated today with increased swelling and SOB. Will add lasix 80 mg BID to spironolactone. If no improvement may need hospital for IV diuretics. Recent hospitalization for same earlier this year.

## 2018-01-18 DIAGNOSIS — K3189 Other diseases of stomach and duodenum: Secondary | ICD-10-CM | POA: Diagnosis not present

## 2018-01-18 DIAGNOSIS — K7460 Unspecified cirrhosis of liver: Secondary | ICD-10-CM | POA: Diagnosis not present

## 2018-01-18 DIAGNOSIS — M19072 Primary osteoarthritis, left ankle and foot: Secondary | ICD-10-CM | POA: Diagnosis not present

## 2018-01-18 DIAGNOSIS — L03116 Cellulitis of left lower limb: Secondary | ICD-10-CM | POA: Diagnosis not present

## 2018-01-18 DIAGNOSIS — N183 Chronic kidney disease, stage 3 (moderate): Secondary | ICD-10-CM | POA: Diagnosis not present

## 2018-01-18 DIAGNOSIS — I70202 Unspecified atherosclerosis of native arteries of extremities, left leg: Secondary | ICD-10-CM | POA: Diagnosis not present

## 2018-01-18 DIAGNOSIS — I130 Hypertensive heart and chronic kidney disease with heart failure and stage 1 through stage 4 chronic kidney disease, or unspecified chronic kidney disease: Secondary | ICD-10-CM | POA: Diagnosis not present

## 2018-01-18 DIAGNOSIS — J449 Chronic obstructive pulmonary disease, unspecified: Secondary | ICD-10-CM | POA: Diagnosis not present

## 2018-01-18 DIAGNOSIS — M109 Gout, unspecified: Secondary | ICD-10-CM | POA: Diagnosis not present

## 2018-01-18 DIAGNOSIS — R634 Abnormal weight loss: Secondary | ICD-10-CM | POA: Diagnosis not present

## 2018-01-18 DIAGNOSIS — G8929 Other chronic pain: Secondary | ICD-10-CM | POA: Diagnosis not present

## 2018-01-18 DIAGNOSIS — M7732 Calcaneal spur, left foot: Secondary | ICD-10-CM | POA: Diagnosis not present

## 2018-01-18 DIAGNOSIS — D631 Anemia in chronic kidney disease: Secondary | ICD-10-CM | POA: Diagnosis not present

## 2018-01-18 DIAGNOSIS — K219 Gastro-esophageal reflux disease without esophagitis: Secondary | ICD-10-CM | POA: Diagnosis not present

## 2018-01-18 DIAGNOSIS — I25119 Atherosclerotic heart disease of native coronary artery with unspecified angina pectoris: Secondary | ICD-10-CM | POA: Diagnosis not present

## 2018-01-18 DIAGNOSIS — M791 Myalgia, unspecified site: Secondary | ICD-10-CM | POA: Diagnosis not present

## 2018-01-18 DIAGNOSIS — Z7984 Long term (current) use of oral hypoglycemic drugs: Secondary | ICD-10-CM | POA: Diagnosis not present

## 2018-01-18 DIAGNOSIS — I5030 Unspecified diastolic (congestive) heart failure: Secondary | ICD-10-CM | POA: Diagnosis not present

## 2018-01-18 DIAGNOSIS — E1122 Type 2 diabetes mellitus with diabetic chronic kidney disease: Secondary | ICD-10-CM | POA: Diagnosis not present

## 2018-01-18 DIAGNOSIS — E785 Hyperlipidemia, unspecified: Secondary | ICD-10-CM | POA: Diagnosis not present

## 2018-01-18 DIAGNOSIS — M47816 Spondylosis without myelopathy or radiculopathy, lumbar region: Secondary | ICD-10-CM | POA: Diagnosis not present

## 2018-01-18 DIAGNOSIS — G4733 Obstructive sleep apnea (adult) (pediatric): Secondary | ICD-10-CM | POA: Diagnosis not present

## 2018-01-18 DIAGNOSIS — K269 Duodenal ulcer, unspecified as acute or chronic, without hemorrhage or perforation: Secondary | ICD-10-CM | POA: Diagnosis not present

## 2018-01-18 DIAGNOSIS — Z7982 Long term (current) use of aspirin: Secondary | ICD-10-CM | POA: Diagnosis not present

## 2018-01-18 DIAGNOSIS — D509 Iron deficiency anemia, unspecified: Secondary | ICD-10-CM | POA: Diagnosis not present

## 2018-02-05 ENCOUNTER — Other Ambulatory Visit: Payer: Self-pay | Admitting: Internal Medicine

## 2018-02-05 DIAGNOSIS — E118 Type 2 diabetes mellitus with unspecified complications: Secondary | ICD-10-CM

## 2018-03-07 ENCOUNTER — Other Ambulatory Visit: Payer: Self-pay | Admitting: Internal Medicine

## 2018-03-07 DIAGNOSIS — E118 Type 2 diabetes mellitus with unspecified complications: Secondary | ICD-10-CM

## 2018-04-06 ENCOUNTER — Other Ambulatory Visit: Payer: Self-pay | Admitting: Internal Medicine

## 2018-04-06 DIAGNOSIS — E118 Type 2 diabetes mellitus with unspecified complications: Secondary | ICD-10-CM

## 2018-04-17 ENCOUNTER — Other Ambulatory Visit: Payer: Self-pay | Admitting: Internal Medicine

## 2018-04-20 ENCOUNTER — Other Ambulatory Visit (INDEPENDENT_AMBULATORY_CARE_PROVIDER_SITE_OTHER): Payer: PPO

## 2018-04-20 ENCOUNTER — Encounter: Payer: Self-pay | Admitting: Internal Medicine

## 2018-04-20 ENCOUNTER — Ambulatory Visit (INDEPENDENT_AMBULATORY_CARE_PROVIDER_SITE_OTHER): Payer: PPO | Admitting: Internal Medicine

## 2018-04-20 VITALS — BP 122/60 | HR 56 | Temp 97.8°F | Ht 67.0 in | Wt 253.0 lb

## 2018-04-20 DIAGNOSIS — E785 Hyperlipidemia, unspecified: Secondary | ICD-10-CM

## 2018-04-20 DIAGNOSIS — K264 Chronic or unspecified duodenal ulcer with hemorrhage: Secondary | ICD-10-CM

## 2018-04-20 DIAGNOSIS — K7460 Unspecified cirrhosis of liver: Secondary | ICD-10-CM | POA: Diagnosis not present

## 2018-04-20 DIAGNOSIS — E118 Type 2 diabetes mellitus with unspecified complications: Secondary | ICD-10-CM

## 2018-04-20 LAB — HEMOGLOBIN A1C: HEMOGLOBIN A1C: 6.2 % (ref 4.6–6.5)

## 2018-04-20 LAB — CBC
HCT: 35.6 % — ABNORMAL LOW (ref 39.0–52.0)
HEMOGLOBIN: 11.6 g/dL — AB (ref 13.0–17.0)
MCHC: 32.6 g/dL (ref 30.0–36.0)
MCV: 90.6 fl (ref 78.0–100.0)
Platelets: 113 10*3/uL — ABNORMAL LOW (ref 150.0–400.0)
RBC: 3.93 Mil/uL — AB (ref 4.22–5.81)
RDW: 16.2 % — AB (ref 11.5–15.5)
WBC: 4.1 10*3/uL (ref 4.0–10.5)

## 2018-04-20 LAB — FERRITIN: Ferritin: 25.9 ng/mL (ref 22.0–322.0)

## 2018-04-20 MED ORDER — FERROUS SULFATE 325 (65 FE) MG PO TBEC: 325.0000 mg | DELAYED_RELEASE_TABLET | Freq: Every day | ORAL | 3 refills | Status: DC

## 2018-04-20 MED ORDER — SPIRONOLACTONE 50 MG PO TABS: 50.0000 mg | ORAL_TABLET | Freq: Every day | ORAL | 3 refills | Status: DC

## 2018-04-20 MED ORDER — METFORMIN HCL 850 MG PO TABS: 850.0000 mg | ORAL_TABLET | Freq: Two times a day (BID) | ORAL | 3 refills | Status: DC

## 2018-04-20 MED ORDER — FUROSEMIDE 80 MG PO TABS: 80.0000 mg | ORAL_TABLET | Freq: Two times a day (BID) | ORAL | 3 refills | Status: DC

## 2018-04-20 MED ORDER — GABAPENTIN 100 MG PO CAPS: ORAL_CAPSULE | ORAL | 3 refills | Status: DC

## 2018-04-20 NOTE — Assessment & Plan Note (Signed)
Taking gabapentin 100 mg BID and refilled today, checking HgA1c and adjust metformin BID if needed.

## 2018-04-20 NOTE — Assessment & Plan Note (Signed)
He is off PPI and not taking iron. Needs CBC and ferritin check today and adjust as needed.

## 2018-04-20 NOTE — Patient Instructions (Signed)
We have sent in the refills today.   We will check the blood counts today and call you back about the results.

## 2018-04-20 NOTE — Progress Notes (Signed)
   Subjective:    Patient ID: [name], male    DOB: [birth year], 72 y.o.   MRN: [id number]  HPI The patient is a 72 YO man coming in for follow up of his diabetes (taking metformin and denies checking sugars currently, has neuropathy and taking 100 mg BID and needs refill on that as well, denies low sugars at home, diet is mediocre) and leg swelling (out of lasix and spironolactone several days, legs are swollen as well as some more SOB with activity, they called pharmacy for refills but did not contact our office) and anemia (follow up of duodenal bleeding, not taking PPI anymore, not taking iron, denies dark stools or blood in stool, denies lightheadedness).   Review of Systems  Constitutional: Positive for activity change. Negative for appetite change, diaphoresis, fever and unexpected weight change.  HENT: Negative.   Eyes: Negative.   Respiratory: Positive for shortness of breath. Negative for cough and chest tightness.   Cardiovascular: Positive for leg swelling. Negative for chest pain and palpitations.  Gastrointestinal: Negative for abdominal distention, abdominal pain, constipation, diarrhea, nausea and vomiting.  Musculoskeletal: Positive for arthralgias.  Skin: Negative.   Neurological: Negative.   Psychiatric/Behavioral: Negative.       Objective:   Physical Exam  Constitutional: He is oriented to person, place, and time. He appears well-developed and well-nourished.  HENT:  Head: Normocephalic and atraumatic.  Eyes: EOM are normal.  Neck: Normal range of motion.  Cardiovascular: Normal rate and regular rhythm.  Pulmonary/Chest: Effort normal and breath sounds normal. No respiratory distress. He has no wheezes. He has no rales.  Abdominal: Soft. Bowel sounds are normal. He exhibits distension. There is no tenderness. There is no rebound.  Musculoskeletal: He exhibits edema.  2-3+ edema bilateral without cellulitis  Neurological: He is alert and oriented to person,  place, and time. Coordination normal.  Skin: Skin is warm and dry.  Psychiatric: He has a normal mood and affect.   Vitals:   04/20/18 0904  BP: 122/60  Pulse: (!) 56  Temp: 97.8 F (36.6 C)  TempSrc: Oral  SpO2: 97%  Weight: 253 lb (114.8 kg)  Height: 5\' 7"  (1.702 m)      Assessment & Plan:

## 2018-04-20 NOTE — Assessment & Plan Note (Signed)
Off spironolactone and lasix for fluid and more SOB and leg swelling. Refilled lasix BID and spironolactone and reminded him and his son about the importance of staying on these meds consistently and calling our office if he cannot get refills at pharmacy and they are not helping him.

## 2018-05-25 ENCOUNTER — Other Ambulatory Visit: Payer: Self-pay

## 2018-05-25 ENCOUNTER — Emergency Department (HOSPITAL_COMMUNITY): Payer: PPO

## 2018-05-25 ENCOUNTER — Encounter (HOSPITAL_COMMUNITY): Payer: Self-pay | Admitting: Emergency Medicine

## 2018-05-25 ENCOUNTER — Emergency Department (HOSPITAL_COMMUNITY)
Admission: EM | Admit: 2018-05-25 | Discharge: 2018-05-26 | Disposition: A | Discharge status: Home / Self Care | Payer: PPO | Attending: Emergency Medicine | Admitting: Emergency Medicine

## 2018-05-25 DIAGNOSIS — R51 Headache: Secondary | ICD-10-CM | POA: Insufficient documentation

## 2018-05-25 DIAGNOSIS — S80812A Abrasion, left lower leg, initial encounter: Secondary | ICD-10-CM | POA: Insufficient documentation

## 2018-05-25 DIAGNOSIS — S299XXA Unspecified injury of thorax, initial encounter: Secondary | ICD-10-CM | POA: Diagnosis not present

## 2018-05-25 DIAGNOSIS — S8992XA Unspecified injury of left lower leg, initial encounter: Secondary | ICD-10-CM | POA: Diagnosis not present

## 2018-05-25 DIAGNOSIS — Y9389 Activity, other specified: Secondary | ICD-10-CM | POA: Diagnosis not present

## 2018-05-25 DIAGNOSIS — M542 Cervicalgia: Secondary | ICD-10-CM | POA: Diagnosis not present

## 2018-05-25 DIAGNOSIS — S161XXA Strain of muscle, fascia and tendon at neck level, initial encounter: Secondary | ICD-10-CM | POA: Insufficient documentation

## 2018-05-25 DIAGNOSIS — T148XXA Other injury of unspecified body region, initial encounter: Secondary | ICD-10-CM | POA: Diagnosis not present

## 2018-05-25 DIAGNOSIS — Y92410 Unspecified street and highway as the place of occurrence of the external cause: Secondary | ICD-10-CM | POA: Diagnosis not present

## 2018-05-25 DIAGNOSIS — Y999 Unspecified external cause status: Secondary | ICD-10-CM | POA: Insufficient documentation

## 2018-05-25 DIAGNOSIS — S0990XA Unspecified injury of head, initial encounter: Secondary | ICD-10-CM | POA: Diagnosis not present

## 2018-05-25 DIAGNOSIS — M25562 Pain in left knee: Secondary | ICD-10-CM | POA: Diagnosis not present

## 2018-05-25 DIAGNOSIS — S199XXA Unspecified injury of neck, initial encounter: Secondary | ICD-10-CM | POA: Diagnosis not present

## 2018-05-25 LAB — PROTIME-INR
INR: 1.07
PROTHROMBIN TIME: 13.8 s (ref 11.4–15.2)

## 2018-05-25 LAB — CBC WITH DIFFERENTIAL/PLATELET
ABS IMMATURE GRANULOCYTES: 0 10*3/uL (ref 0.0–0.1)
BASOS PCT: 0 %
Basophils Absolute: 0 10*3/uL (ref 0.0–0.1)
Eosinophils Absolute: 0.2 10*3/uL (ref 0.0–0.7)
Eosinophils Relative: 3 %
HCT: 38.8 % — ABNORMAL LOW (ref 39.0–52.0)
Hemoglobin: 12.2 g/dL — ABNORMAL LOW (ref 13.0–17.0)
IMMATURE GRANULOCYTES: 1 %
LYMPHS PCT: 19 %
Lymphs Abs: 1 10*3/uL (ref 0.7–4.0)
MCH: 28.1 pg (ref 26.0–34.0)
MCHC: 31.4 g/dL (ref 30.0–36.0)
MCV: 89.4 fL (ref 78.0–100.0)
Monocytes Absolute: 0.5 10*3/uL (ref 0.1–1.0)
Monocytes Relative: 8 %
NEUTROS ABS: 3.8 10*3/uL (ref 1.7–7.7)
NEUTROS PCT: 69 %
PLATELETS: 145 10*3/uL — AB (ref 150–400)
RBC: 4.34 MIL/uL (ref 4.22–5.81)
RDW: 13.8 % (ref 11.5–15.5)
WBC: 5.5 10*3/uL (ref 4.0–10.5)

## 2018-05-25 MED ORDER — OXYCODONE-ACETAMINOPHEN 5-325 MG PO TABS
1.0000 | ORAL_TABLET | Freq: Once | ORAL | Status: AC
  Administered 2018-05-26: 1 via ORAL
  Filled 2018-05-25: qty 1

## 2018-05-25 MED ORDER — IBUPROFEN 400 MG PO TABS: 400.0000 mg | ORAL_TABLET | Freq: Four times a day (QID) | ORAL | 0 refills | Status: DC | PRN

## 2018-05-25 MED ORDER — ACETAMINOPHEN 325 MG PO TABS: 650.0000 mg | ORAL_TABLET | Freq: Four times a day (QID) | ORAL | 0 refills | Status: DC | PRN
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
## 2018-05-26 NOTE — ED Notes (Signed)
Patient is alert and orientedx4.  Patient was explained discharge instructions and they understood them with no questions.  The patient's son, Murat Rideout is taking the patient home.

## 2018-07-04 ENCOUNTER — Telehealth: Payer: Self-pay

## 2018-07-04 NOTE — Telephone Encounter (Signed)
Copied from [place] [phone]. Topic: General - Inquiry >> Jul 04, 2018  9:49 AM Conception Chancy, NT wrote: Reason for CRM: Catalina Antigua is calling from [facility] Medicare D and states the pharmacist is wanting to go over the patients medication list with Dr. Sharlet Salina.  Cb# [phone]  Case# [id number]

## 2018-07-04 NOTE — Telephone Encounter (Signed)
LVM for Matt to call back to go over medication list.

## 2018-07-06 NOTE — Telephone Encounter (Signed)
LVM for Lelan Pons to call back to go over patients medication list

## 2018-07-06 NOTE — Telephone Encounter (Signed)
Lelan Pons is calling from [facility] Medicare D and states the pharmacist is wanting to go over the patients medication list with Dr. Sharlet Salina.  Cb# [phone]

## 2018-07-06 NOTE — Telephone Encounter (Signed)
In contact with [name] to go over patient medication

## 2018-07-06 NOTE — Telephone Encounter (Signed)
Lelan Pons from [facility] is returning call to [place].  CB# [phone]

## 2018-09-12 ENCOUNTER — Ambulatory Visit (INDEPENDENT_AMBULATORY_CARE_PROVIDER_SITE_OTHER)
Admission: RE | Admit: 2018-09-12 | Discharge: 2018-09-12 | Disposition: A | Discharge status: Home / Self Care | Payer: PPO | Source: Ambulatory Visit | Attending: Internal Medicine | Admitting: Internal Medicine

## 2018-09-12 ENCOUNTER — Encounter: Payer: Self-pay | Admitting: Internal Medicine

## 2018-09-12 ENCOUNTER — Ambulatory Visit (INDEPENDENT_AMBULATORY_CARE_PROVIDER_SITE_OTHER): Payer: PPO | Admitting: Internal Medicine

## 2018-09-12 VITALS — BP 138/70 | HR 72 | Temp 97.8°F | Ht 67.0 in | Wt 251.0 lb

## 2018-09-12 DIAGNOSIS — M542 Cervicalgia: Secondary | ICD-10-CM | POA: Insufficient documentation

## 2018-09-12 MED ORDER — METHYLPREDNISOLONE ACETATE 40 MG/ML IJ SUSP
40.0000 mg | Freq: Once | INTRAMUSCULAR | Status: AC
  Administered 2018-09-12: 40 mg via INTRAMUSCULAR

## 2018-09-12 NOTE — Patient Instructions (Addendum)
We have given you a shot today that may help and checking an x-ray today.   You can take tylenol for pain.

## 2018-09-12 NOTE — Assessment & Plan Note (Signed)
Ordered x-ray cervical spine and depo-medrol 40 mg IM given at visit. If no improvement or changes on x-ray will refer to neurosurgery.
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
## 2018-11-21 ENCOUNTER — Other Ambulatory Visit: Payer: Self-pay | Admitting: Internal Medicine

## 2018-11-21 DIAGNOSIS — E785 Hyperlipidemia, unspecified: Secondary | ICD-10-CM

## 2018-12-24 ENCOUNTER — Other Ambulatory Visit: Payer: Self-pay

## 2018-12-24 ENCOUNTER — Encounter (HOSPITAL_COMMUNITY): Payer: Self-pay | Admitting: Emergency Medicine

## 2018-12-24 ENCOUNTER — Emergency Department (HOSPITAL_COMMUNITY): Payer: PPO

## 2018-12-24 ENCOUNTER — Observation Stay (HOSPITAL_COMMUNITY)
Admission: EM | Admit: 2018-12-24 | Discharge: 2018-12-25 | Disposition: A | Discharge status: Home / Self Care | Payer: PPO | Attending: Internal Medicine | Admitting: Internal Medicine

## 2018-12-24 DIAGNOSIS — D649 Anemia, unspecified: Secondary | ICD-10-CM | POA: Diagnosis not present

## 2018-12-24 DIAGNOSIS — R0602 Shortness of breath: Secondary | ICD-10-CM

## 2018-12-24 DIAGNOSIS — Z955 Presence of coronary angioplasty implant and graft: Secondary | ICD-10-CM | POA: Insufficient documentation

## 2018-12-24 DIAGNOSIS — I2510 Atherosclerotic heart disease of native coronary artery without angina pectoris: Secondary | ICD-10-CM | POA: Insufficient documentation

## 2018-12-24 DIAGNOSIS — Z6841 Body Mass Index (BMI) 40.0 and over, adult: Secondary | ICD-10-CM | POA: Insufficient documentation

## 2018-12-24 DIAGNOSIS — K7460 Unspecified cirrhosis of liver: Secondary | ICD-10-CM | POA: Insufficient documentation

## 2018-12-24 DIAGNOSIS — I5033 Acute on chronic diastolic (congestive) heart failure: Secondary | ICD-10-CM

## 2018-12-24 DIAGNOSIS — E785 Hyperlipidemia, unspecified: Secondary | ICD-10-CM | POA: Diagnosis not present

## 2018-12-24 DIAGNOSIS — Z9049 Acquired absence of other specified parts of digestive tract: Secondary | ICD-10-CM | POA: Diagnosis not present

## 2018-12-24 DIAGNOSIS — G4733 Obstructive sleep apnea (adult) (pediatric): Secondary | ICD-10-CM | POA: Insufficient documentation

## 2018-12-24 DIAGNOSIS — Z8249 Family history of ischemic heart disease and other diseases of the circulatory system: Secondary | ICD-10-CM | POA: Diagnosis not present

## 2018-12-24 DIAGNOSIS — D696 Thrombocytopenia, unspecified: Secondary | ICD-10-CM | POA: Diagnosis not present

## 2018-12-24 DIAGNOSIS — E871 Hypo-osmolality and hyponatremia: Secondary | ICD-10-CM | POA: Diagnosis not present

## 2018-12-24 DIAGNOSIS — R072 Precordial pain: Secondary | ICD-10-CM | POA: Insufficient documentation

## 2018-12-24 DIAGNOSIS — I130 Hypertensive heart and chronic kidney disease with heart failure and stage 1 through stage 4 chronic kidney disease, or unspecified chronic kidney disease: Secondary | ICD-10-CM | POA: Insufficient documentation

## 2018-12-24 DIAGNOSIS — E118 Type 2 diabetes mellitus with unspecified complications: Secondary | ICD-10-CM

## 2018-12-24 DIAGNOSIS — Z79899 Other long term (current) drug therapy: Secondary | ICD-10-CM | POA: Insufficient documentation

## 2018-12-24 DIAGNOSIS — Z8659 Personal history of other mental and behavioral disorders: Secondary | ICD-10-CM | POA: Diagnosis not present

## 2018-12-24 DIAGNOSIS — R05 Cough: Secondary | ICD-10-CM | POA: Diagnosis not present

## 2018-12-24 DIAGNOSIS — Z8711 Personal history of peptic ulcer disease: Secondary | ICD-10-CM | POA: Insufficient documentation

## 2018-12-24 DIAGNOSIS — I25810 Atherosclerosis of coronary artery bypass graft(s) without angina pectoris: Secondary | ICD-10-CM | POA: Diagnosis not present

## 2018-12-24 DIAGNOSIS — K219 Gastro-esophageal reflux disease without esophagitis: Secondary | ICD-10-CM | POA: Diagnosis present

## 2018-12-24 DIAGNOSIS — J449 Chronic obstructive pulmonary disease, unspecified: Secondary | ICD-10-CM | POA: Insufficient documentation

## 2018-12-24 DIAGNOSIS — E1122 Type 2 diabetes mellitus with diabetic chronic kidney disease: Secondary | ICD-10-CM | POA: Diagnosis not present

## 2018-12-24 DIAGNOSIS — Z7984 Long term (current) use of oral hypoglycemic drugs: Secondary | ICD-10-CM | POA: Insufficient documentation

## 2018-12-24 DIAGNOSIS — N183 Chronic kidney disease, stage 3 (moderate): Secondary | ICD-10-CM | POA: Insufficient documentation

## 2018-12-24 DIAGNOSIS — Z20828 Contact with and (suspected) exposure to other viral communicable diseases: Secondary | ICD-10-CM | POA: Diagnosis not present

## 2018-12-24 DIAGNOSIS — Z87891 Personal history of nicotine dependence: Secondary | ICD-10-CM | POA: Insufficient documentation

## 2018-12-24 DIAGNOSIS — Z951 Presence of aortocoronary bypass graft: Secondary | ICD-10-CM | POA: Diagnosis not present

## 2018-12-24 DIAGNOSIS — I5030 Unspecified diastolic (congestive) heart failure: Secondary | ICD-10-CM | POA: Diagnosis present

## 2018-12-24 LAB — COMPREHENSIVE METABOLIC PANEL
ALT: 19 U/L (ref 0–44)
AST: 28 U/L (ref 15–41)
Albumin: 3.5 g/dL (ref 3.5–5.0)
Alkaline Phosphatase: 108 U/L (ref 38–126)
Anion gap: 9 (ref 5–15)
BUN: 19 mg/dL (ref 8–23)
CO2: 21 mmol/L — ABNORMAL LOW (ref 22–32)
Calcium: 8.7 mg/dL — ABNORMAL LOW (ref 8.9–10.3)
Chloride: 103 mmol/L (ref 98–111)
Creatinine, Ser: 1.13 mg/dL (ref 0.61–1.24)
GFR calc Af Amer: >60 mL/min (ref >60)
GFR calc non Af Amer: >60 mL/min (ref >60)
Glucose, Bld: 197 mg/dL — ABNORMAL HIGH (ref 70–99)
Potassium: 4.6 mmol/L (ref 3.5–5.1)
Sodium: 133 mmol/L — ABNORMAL LOW (ref 135–145)
Total Bilirubin: 1.4 mg/dL — ABNORMAL HIGH (ref 0.3–1.2)
Total Protein: 7 g/dL (ref 6.5–8.1)

## 2018-12-24 LAB — PREPARE RBC (CROSSMATCH)

## 2018-12-24 LAB — IRON AND TIBC
Iron: 24 ug/dL — ABNORMAL LOW (ref 45–182)
Saturation Ratios: 4 % — ABNORMAL LOW (ref 17.9–39.5)
TIBC: 536 ug/dL — ABNORMAL HIGH (ref 250–450)
UIBC: 512 ug/dL

## 2018-12-24 LAB — HEMOGLOBIN AND HEMATOCRIT, BLOOD
HCT: 24.5 % — ABNORMAL LOW (ref 39.0–52.0)
Hemoglobin: 6.9 g/dL — CL (ref 13.0–17.0)

## 2018-12-24 LAB — TROPONIN I
Troponin I: <0.03 ng/mL (ref <0.03)
Troponin I: <0.03 ng/mL (ref <0.03)
Troponin I: <0.03 ng/mL (ref <0.03)

## 2018-12-24 LAB — CBC WITH DIFFERENTIAL/PLATELET
Abs Immature Granulocytes: 0.03 10*3/uL (ref 0.00–0.07)
Basophils Absolute: 0 10*3/uL (ref 0.0–0.1)
Basophils Relative: 0 %
Eosinophils Absolute: 0.2 10*3/uL (ref 0.0–0.5)
Eosinophils Relative: 3 %
HCT: 22.9 % — ABNORMAL LOW (ref 39.0–52.0)
Hemoglobin: 6.3 g/dL — CL (ref 13.0–17.0)
Immature Granulocytes: 1 %
Lymphocytes Relative: 10 %
Lymphs Abs: 0.6 10*3/uL — ABNORMAL LOW (ref 0.7–4.0)
MCH: 20.7 pg — ABNORMAL LOW (ref 26.0–34.0)
MCHC: 27.5 g/dL — ABNORMAL LOW (ref 30.0–36.0)
MCV: 75.1 fL — ABNORMAL LOW (ref 80.0–100.0)
Monocytes Absolute: 0.5 10*3/uL (ref 0.1–1.0)
Monocytes Relative: 8 %
Neutro Abs: 4.3 10*3/uL (ref 1.7–7.7)
Neutrophils Relative %: 78 %
Platelets: 137 10*3/uL — ABNORMAL LOW (ref 150–400)
RBC: 3.05 MIL/uL — ABNORMAL LOW (ref 4.22–5.81)
RDW: 19.5 % — ABNORMAL HIGH (ref 11.5–15.5)
WBC: 5.6 10*3/uL (ref 4.0–10.5)
nRBC: 0 % (ref 0.0–0.2)

## 2018-12-24 LAB — POC OCCULT BLOOD, ED: Fecal Occult Bld: NEGATIVE

## 2018-12-24 LAB — RETICULOCYTES
Immature Retic Fract: 29.8 % — ABNORMAL HIGH (ref 2.3–15.9)
RBC.: 3.09 MIL/uL — ABNORMAL LOW (ref 4.22–5.81)
Retic Count, Absolute: 108.8 10*3/uL (ref 19.0–186.0)
Retic Ct Pct: 3.5 % — ABNORMAL HIGH (ref 0.4–3.1)

## 2018-12-24 LAB — VITAMIN B12: Vitamin B-12: 275 pg/mL (ref 180–914)

## 2018-12-24 LAB — FERRITIN: Ferritin: 14 ng/mL — ABNORMAL LOW (ref 24–336)

## 2018-12-24 LAB — GLUCOSE, CAPILLARY
Glucose-Capillary: 158 mg/dL — ABNORMAL HIGH (ref 70–99)
Glucose-Capillary: 228 mg/dL — ABNORMAL HIGH (ref 70–99)
Glucose-Capillary: 139 mg/dL — ABNORMAL HIGH (ref 70–99)

## 2018-12-24 LAB — FOLATE: Folate: 30.9 ng/mL (ref >5.9)

## 2018-12-24 LAB — TSH: TSH: 1.854 u[IU]/mL (ref 0.350–4.500)

## 2018-12-24 LAB — BRAIN NATRIURETIC PEPTIDE: B Natriuretic Peptide: 244.7 pg/mL — ABNORMAL HIGH (ref 0.0–100.0)

## 2018-12-24 LAB — SARS CORONAVIRUS 2 BY RT PCR (HOSPITAL ORDER, PERFORMED IN ~~LOC~~ HOSPITAL LAB): SARS Coronavirus 2: NEGATIVE

## 2018-12-24 MED ORDER — ONDANSETRON HCL 4 MG PO TABS: 4.0000 mg | ORAL_TABLET | Freq: Four times a day (QID) | ORAL | Status: DC | PRN

## 2018-12-24 MED ORDER — INSULIN ASPART 100 UNIT/ML ~~LOC~~ SOLN: 0.0000 [IU] | Freq: Every day | SUBCUTANEOUS | Status: DC

## 2018-12-24 MED ORDER — SODIUM CHLORIDE 0.9 % IV SOLN: 10.0000 mL/h | Freq: Once | INTRAVENOUS | Status: DC

## 2018-12-24 MED ORDER — ALBUTEROL SULFATE (2.5 MG/3ML) 0.083% IN NEBU: 2.5000 mg | INHALATION_SOLUTION | RESPIRATORY_TRACT | Status: DC | PRN

## 2018-12-24 MED ORDER — THIAMINE HCL 100 MG/ML IJ SOLN: 100.0000 mg | Freq: Every day | INTRAMUSCULAR | Status: DC

## 2018-12-24 MED ORDER — LORAZEPAM 1 MG PO TABS: 1.0000 mg | ORAL_TABLET | Freq: Four times a day (QID) | ORAL | Status: DC | PRN

## 2018-12-24 MED ORDER — ACETAMINOPHEN 650 MG RE SUPP: 650.0000 mg | Freq: Four times a day (QID) | RECTAL | Status: DC | PRN

## 2018-12-24 MED ORDER — FUROSEMIDE 10 MG/ML IJ SOLN
80.0000 mg | Freq: Two times a day (BID) | INTRAMUSCULAR | Status: DC
  Administered 2018-12-24 – 2018-12-25 (×2): 80 mg via INTRAVENOUS
  Filled 2018-12-24 (×2): qty 8

## 2018-12-24 MED ORDER — LORAZEPAM 2 MG/ML IJ SOLN: 1.0000 mg | Freq: Four times a day (QID) | INTRAMUSCULAR | Status: DC | PRN

## 2018-12-24 MED ORDER — ALBUTEROL SULFATE HFA 108 (90 BASE) MCG/ACT IN AERS
6.0000 | INHALATION_SPRAY | Freq: Once | RESPIRATORY_TRACT | Status: AC
  Administered 2018-12-24: 6 via RESPIRATORY_TRACT
  Filled 2018-12-24: qty 6.7

## 2018-12-24 MED ORDER — SODIUM CHLORIDE 0.9 % IV SOLN
80.0000 mg | Freq: Once | INTRAVENOUS | Status: AC
  Administered 2018-12-24: 80 mg via INTRAVENOUS
  Filled 2018-12-24: qty 80

## 2018-12-24 MED ORDER — FOLIC ACID 1 MG PO TABS
1.0000 mg | ORAL_TABLET | Freq: Every day | ORAL | Status: DC
  Administered 2018-12-24 – 2018-12-25 (×2): 1 mg via ORAL
  Filled 2018-12-24 (×2): qty 1

## 2018-12-24 MED ORDER — VITAMIN B-1 100 MG PO TABS
100.0000 mg | ORAL_TABLET | Freq: Every day | ORAL | Status: DC
  Administered 2018-12-24 – 2018-12-25 (×2): 100 mg via ORAL
  Filled 2018-12-24 (×2): qty 1

## 2018-12-24 MED ORDER — FERROUS SULFATE 325 (65 FE) MG PO TBEC: 325.0000 mg | DELAYED_RELEASE_TABLET | Freq: Every day | ORAL | Status: DC

## 2018-12-24 MED ORDER — NITROGLYCERIN 0.4 MG SL SUBL: 0.4000 mg | SUBLINGUAL_TABLET | SUBLINGUAL | Status: DC | PRN

## 2018-12-24 MED ORDER — FUROSEMIDE 10 MG/ML IJ SOLN
80.0000 mg | Freq: Once | INTRAMUSCULAR | Status: AC
  Administered 2018-12-24: 80 mg via INTRAVENOUS
  Filled 2018-12-24: qty 8

## 2018-12-24 MED ORDER — ATORVASTATIN CALCIUM 40 MG PO TABS: 40.0000 mg | ORAL_TABLET | Freq: Every day | ORAL | Status: DC

## 2018-12-24 MED ORDER — POLYETHYLENE GLYCOL 3350 17 G PO PACK: 17.0000 g | PACK | Freq: Every day | ORAL | Status: DC | PRN

## 2018-12-24 MED ORDER — SODIUM CHLORIDE 0.9% FLUSH
3.0000 mL | Freq: Two times a day (BID) | INTRAVENOUS | Status: DC
  Administered 2018-12-24 – 2018-12-25 (×2): 3 mL via INTRAVENOUS

## 2018-12-24 MED ORDER — ONDANSETRON HCL 4 MG/2ML IJ SOLN: 4.0000 mg | Freq: Four times a day (QID) | INTRAMUSCULAR | Status: DC | PRN

## 2018-12-24 MED ORDER — PANTOPRAZOLE SODIUM 40 MG PO TBEC
40.0000 mg | DELAYED_RELEASE_TABLET | Freq: Every day | ORAL | Status: DC
  Administered 2018-12-25: 40 mg via ORAL
  Filled 2018-12-24 (×2): qty 1

## 2018-12-24 MED ORDER — GABAPENTIN 100 MG PO CAPS
100.0000 mg | ORAL_CAPSULE | Freq: Two times a day (BID) | ORAL | Status: DC
  Administered 2018-12-24 – 2018-12-25 (×3): 100 mg via ORAL
  Filled 2018-12-24 (×3): qty 1

## 2018-12-24 MED ORDER — INSULIN ASPART 100 UNIT/ML ~~LOC~~ SOLN
0.0000 [IU] | Freq: Three times a day (TID) | SUBCUTANEOUS | Status: DC
  Administered 2018-12-24 – 2018-12-25 (×2): 3 [IU] via SUBCUTANEOUS
  Administered 2018-12-25: 1 [IU] via SUBCUTANEOUS

## 2018-12-24 MED ORDER — ADULT MULTIVITAMIN W/MINERALS CH
1.0000 | ORAL_TABLET | Freq: Every day | ORAL | Status: DC
  Administered 2018-12-24 – 2018-12-25 (×2): 1 via ORAL
  Filled 2018-12-24 (×2): qty 1

## 2018-12-24 MED ORDER — ACETAMINOPHEN 325 MG PO TABS: 650.0000 mg | ORAL_TABLET | Freq: Four times a day (QID) | ORAL | Status: DC | PRN

## 2018-12-24 NOTE — ED Notes (Addendum)
Bladder scan complete. Result >253mL

## 2018-12-24 NOTE — ED Triage Notes (Signed)
Patient arrived from home with reports of shortness of breath X 4 days. He reports having history of asthma, states he ran out his inhaler for a few months,  Reports he also ran out of his lasix. Denies fever, endorses productive cough X 2 weeks (white color)

## 2018-12-24 NOTE — ED Notes (Signed)
Son-[name] called to notify of patient's new location

## 2018-12-24 NOTE — H&P (Addendum)
History and Physical    [name] AYT:[id number] DOB: 06-20-1946 DOA: 12/24/2018  Referring MD/NP/PA: Nanda Quinton, MD PCP: John Koch, MD  Patient coming from: home  Chief Complaint: Shortness of breath  I have personally briefly reviewed patient's old medical records in [place]   HPI: [name] is a 73 y.o. male with medical history significant of diastolic CHF last EF 35-57%, hepatic cirrhosis, CAD, DM type II, CKD stage III, and DM type II; who presents with complaints of shortness of breath for last 2 days.  Reports having increased work of breathing with dry cough.  He recently ran out of Lasix, and reports being unable to get a refill until his check comes on 4/30.  [name] notes that he had also been unable to afford his albuterol inhaler. He had been practicing social distancing and denies any recent sick contacts to his knowledge.  Patient does report that he has not been compliant with dietary restrictions.  Unsure if he has had any blood in his stools because he usually does not check, and unable to tell me if he feels like his weight has increased.  Other associated symptoms include some chest tightness, increased swelling on the bilateral lower extremities, and episode of diaphoresis while here in the emergency department.  Patient's son manages all of his medications as he cannot read or write.  Denies any fever, abdominal pain, dysuria, orthopnea, myalgias, or any focal weakness.   Discussed case with his son who notes that he has had hemorrhoids in the past and intermittently strains.  He was able to get his medications of Lasix refilled today. Also states that he no longer smokes, but does drink 16 ounce bottle beer 2 or 3 times per week.  Review of records shows patient had duodenal ulcer noted in EGD on 09/2017, and 5 nonbleeding angioectasias treated with APC noted on EGD in 11/2017.    ED Course: On admission patient seen to be afebrile, pulse  74-80, blood pressure is maintained, and respirations up to 24 with oxygen saturations maintained.  Labs revealed hemoglobin 6.3, platelets 137, total bilirubin 1.4, BNP 244.7, troponin <0.03, and COVID negative.  Stool guaiac was negative.  Single view chest x-ray showed asymmetric pulmonary edema right worse than the left versus less likely atypical pneumonia.  Patient was started on Protonix drip, given 80 mg of Lasix IV, and ordered 2 units of packed red blood cells.  TRH called to admit.  Review of Systems  Constitutional: Positive for diaphoresis. Negative for fever.  HENT: Negative for congestion and sore throat.   Eyes: Negative for double vision and photophobia.  Respiratory: Positive for cough and shortness of breath.   Cardiovascular: Positive for leg swelling. Negative for orthopnea.  Gastrointestinal: Negative for blood in stool, diarrhea, nausea and vomiting.  Genitourinary: Negative for dysuria and frequency.  Musculoskeletal: Negative for falls and myalgias.  Skin: Negative for itching.  Neurological: Negative for focal weakness and loss of consciousness.  Psychiatric/Behavioral: Negative for memory loss and suicidal ideas.    Past Medical History:  Diagnosis Date  . Arthritis    "joints tighten up sometimes" (03/27/2104)  . Asthma   . CHF (congestive heart failure) ([place])   . Chronic low back pain   . Chronic lower back pain   . Coronary artery disease    a. 05/2012 Cath/PCI: LM 40ost, 50-60d, LAD 99p ruptured plaque (3.0x28 DES), LCX 50p/m, RCA 30-40p, 64m, EF 65-70%.  . Family history  of adverse reaction to anesthesia    Pt granddaughter had PONV  . GERD (gastroesophageal reflux disease)   . Hepatic cirrhosis ([place])    a. Dx 01/2014 - CT a/p   . History of blood transfusion    "related to bleeding ulcers"  . History of concussion    1976--  NO RESIDUAL  . History of GI bleed    a. UGIB 07/2012;  b. 01/2014 admission with GIB/FOB stool req 1U prbc's->EGD showed portal  gastropathy, barrett's esoph, and chronic active h. pylori gastritis.  Marland Kitchen History of gout    2007 &  2008  LEFT LEG-- NO ISSUE SINCE  . Hyperlipidemia   . Iron deficiency anemia   . Kidney stones   . Lipoma   . OA (osteoarthritis of spine)    LOWER BACK--  INTERMITTANT LEFT LEG NUMBNESS  . OSA (obstructive sleep apnea)    PULMOLOGIST-  DR CLANCE--  MODERATE OSA  STARTED CPAP 2012--  BUT CURRENTLY HAS NOT USED PAST 6 MONTHS  . Phimosis    a. s/p circumcision 2015.  . Type 2 diabetes mellitus ([place])   . Unspecified essential hypertension     Past Surgical History:  Procedure Laterality Date  . ANKLE FRACTURE SURGERY Right 1989   "plate put in"  . APPENDECTOMY  05-16-2004   open  . CIRCUMCISION N/A 09/09/2013   Procedure: CIRCUMCISION ADULT;  Surgeon: Bernestine Amass, MD;  Location: [facility];  Service: Urology;  Laterality: N/A;  . COLECTOMY  05-16-2004  . CORONARY ANGIOPLASTY WITH STENT PLACEMENT  06/28/2012  DR COOPER   PCI W/  X1 DES to [place]. LAD/  LM  40% OSTIAL & 50-60% DISTAL /  50% PROX LCX/  30-40% PROX RCA & 50% MID RCA/   LVEF 65-70%  . CORONARY ARTERY BYPASS GRAFT N/A 03/31/2014   Procedure: CORONARY ARTERY BYPASS GRAFTING (CABG) times 3 using left internal mammary artery and right saphenous vein.;  Surgeon: Melrose Nakayama, MD;  Location: [place];  Service: Open Heart Surgery;  Laterality: N/A;  . DOBUTAMINE STRESS ECHO  06-08-2012   MODERATE HYPOKINESIS/ ISCHEMIA MID INFERIOR WALL  . ESOPHAGOGASTRODUODENOSCOPY  08/15/2012   Procedure: ESOPHAGOGASTRODUODENOSCOPY (EGD);  Surgeon: Wonda Horner, MD;  Location[facility] ENDOSCOPY;  Service: Endoscopy;  Laterality: N/A;  . ESOPHAGOGASTRODUODENOSCOPY N/A 02/17/2014   Procedure: ESOPHAGOGASTRODUODENOSCOPY (EGD);  Surgeon: Jeryl Columbia, MD;  Location[facility] ENDOSCOPY;  Service: Endoscopy;  Laterality: N/A;  . ESOPHAGOGASTRODUODENOSCOPY (EGD) WITH PROPOFOL N/A 09/30/2017   Procedure: ESOPHAGOGASTRODUODENOSCOPY (EGD) WITH  PROPOFOL;  Surgeon: Wonda Horner, MD;  Location: [facility] ENDOSCOPY;  Service: Endoscopy;  Laterality: N/A;  . ESOPHAGOGASTRODUODENOSCOPY (EGD) WITH PROPOFOL N/A 12/20/2017   Procedure: ESOPHAGOGASTRODUODENOSCOPY (EGD) WITH PROPOFOL;  Surgeon: Clarene Essex, MD;  Location: [place];  Service: Endoscopy;  Laterality: N/A;  . HERNIA REPAIR    . INTRAOPERATIVE TRANSESOPHAGEAL ECHOCARDIOGRAM N/A 03/31/2014   Procedure: INTRAOPERATIVE TRANSESOPHAGEAL ECHOCARDIOGRAM;  Surgeon: Melrose Nakayama, MD;  Location: [place];  Service: Open Heart Surgery;  Laterality: N/A;  . LAPAROSCOPIC UMBILICAL HERNIA REPAIR W/ MESH  06-06-2011  . LEFT HEART CATHETERIZATION WITH CORONARY ANGIOGRAM N/A 03/28/2014   Procedure: LEFT HEART CATHETERIZATION WITH CORONARY ANGIOGRAM;  Surgeon: Sinclair Grooms, MD;  Location: [facility] CATH LAB;  Service: Cardiovascular;  Laterality: N/A;  . LIPOMA EXCISION Left 08/25/2017   Procedure: EXCISION LIPOMA LEFT POSTERIOR THIGH;  Surgeon: Clovis Riley, MD;  Location: [place];  Service: General;  Laterality: Left;  . NEPHROLITHOTOMY  1990'S  . OPEN APPENDECTOMY W/ PARTIAL CECECTOMY  05-16-2004  . PERCUTANEOUS CORONARY STENT INTERVENTION (PCI-S) N/A 06/28/2012   Procedure: PERCUTANEOUS CORONARY STENT INTERVENTION (PCI-S);  Surgeon: Sherren Mocha, MD;  Location: [facility] CATH LAB;  Service: Cardiovascular;  Laterality: N/A;     reports that he quit smoking about 22 years ago. His smoking use included cigarettes. He has a 80.00 pack-year smoking history. He has never used smokeless tobacco. He reports current alcohol use. He reports that he does not use drugs.  No Known Allergies  Family History  Problem Relation Age of Onset  . Lung cancer Sister   . Cancer Sister        lung  . Cancer Mother   . Cancer Father        died in his 61s.  . Cancer Brother        lung  . Coronary artery disease Other   . Diabetes Other   . Colon cancer Other   . Cancer Sister        lung    Prior to  Admission medications   Medication Sig Start Date End Date Taking? Authorizing Provider  acetaminophen (TYLENOL) 325 MG tablet Take 2 tablets (650 mg total) by mouth every 6 (six) hours as needed. 05/25/18   Varney Biles, MD  ANORO ELLIPTA 62.5-25 MCG/INH AEPB INHALE 1 PUFF INTO THE LUNGS DAILY Patient not taking: Reported on 05/25/2018 01/01/18   John Koch, MD  atorvastatin (LIPITOR) 40 MG tablet TAKE 1 TABLET BY MOUTH DAILY AT 6PM 11/22/18   John Koch, MD  diclofenac sodium (VOLTAREN) 1 % GEL Apply 2 g topically 4 (four) times daily. Patient not taking: Reported on 12/18/2017 12/04/17   John Koch, MD  ferrous sulfate 325 (65 FE) MG EC tablet Take 1 tablet (325 mg total) by mouth daily with breakfast. Patient not taking: Reported on 05/25/2018 04/20/18   John Koch, MD  furosemide (LASIX) 80 MG tablet Take 1 tablet (80 mg total) by mouth 2 (two) times daily. 04/20/18   John Koch, MD  gabapentin (NEURONTIN) 100 MG capsule TAKE 1 CAPSULE(100 MG) BY MOUTH TWICE DAILY 04/20/18   John Koch, MD  ibuprofen (ADVIL,MOTRIN) 400 MG tablet Take 1 tablet (400 mg total) by mouth every 6 (six) hours as needed. 05/25/18   Varney Biles, MD  metFORMIN (GLUCOPHAGE) 850 MG tablet Take 1 tablet (850 mg total) by mouth 2 (two) times daily with a meal. 04/20/18   John Koch, MD  nitroGLYCERIN (NITROSTAT) 0.4 MG SL tablet Place 1 tablet (0.4 mg total) under the tongue every 5 (five) minutes as needed for chest pain. 06/08/16 05/25/18  Nahser, Wonda Cheng, MD  polyethylene glycol [facility] Floria Raveling) packet Take 17 g by mouth 2 (two) times daily. Patient taking differently: Take 17 g by mouth daily as needed.  10/05/17   Roxan Hockey, MD  spironolactone (ALDACTONE) 50 MG tablet Take 1 tablet (50 mg total) by mouth daily. Patient not taking: Reported on 05/25/2018 04/20/18   John Koch, MD    Physical Exam:  Constitutional: Obese male who  appears to be in some respiratory distress Vitals:   12/24/18 0915 12/24/18 0945 12/24/18 1014 12/24/18 1100  BP: (!) 151/66 (!) 139/52  136/62  Pulse: 77 74 74 78  Resp: (!) 22 (!) 24 (!) 21 (!) 24  Temp:      TempSrc:      SpO2: 100% 99% 100% 100%  Weight:  Height:       Eyes: PERRL, lids and conjunctivae normal ENMT: Mucous membranes are moist. Posterior pharynx clear of any exudate or lesions.  Neck: Increased circumference of neck with JVD appreciated. Respiratory: Tachypneic with positive crackles in the lower lung fields bilaterally.  No significant wheezes or rhonchi appreciated.  Patient currently on 2 L of nasal cannula oxygen. Cardiovascular: Regular rate and rhythm, no murmurs / rubs / gallops.  +3 pitting lower extremity edema. 2+ pedal pulses. No carotid bruits.  Abdomen: no tenderness, no masses palpated. No hepatosplenomegaly. Bowel sounds positive.  Musculoskeletal: no clubbing / cyanosis. No joint deformity upper and lower extremities. Good ROM, no contractures. Normal muscle tone.  Skin: Venous stasis changes of the bilateral lower extremities noted. Neurologic: CN 2-12 grossly intact. Sensation intact, DTR normal. Strength 5/5 in all 4.  Psychiatric: Fair judgment and insight. Alert and oriented x 3. Normal mood.     Labs on Admission: I have personally reviewed following labs and imaging studies  CBC: Recent Labs  Lab 12/24/18 0849  WBC 5.6  NEUTROABS 4.3  HGB 6.3*  HCT 22.9*  MCV 75.1*  PLT 485*   Basic Metabolic Panel: Recent Labs  Lab 12/24/18 0849  NA 133*  K 4.6  CL 103  CO2 21*  GLUCOSE 197*  BUN 19  CREATININE 1.13  CALCIUM 8.7*   GFR: Estimated Creatinine Clearance: 72.5 mL/min (by C-G formula based on SCr of 1.13 mg/dL). Liver Function Tests: Recent Labs  Lab 12/24/18 0849  AST 28  ALT 19  ALKPHOS 108  BILITOT 1.4*  PROT 7.0  ALBUMIN 3.5   No results for input(s): LIPASE, AMYLASE in the last 168 hours. No results for  input(s): AMMONIA in the last 168 hours. Coagulation Profile: No results for input(s): INR, PROTIME in the last 168 hours. Cardiac Enzymes: Recent Labs  Lab 12/24/18 0849  TROPONINI <0.03   BNP (last 3 results) No results for input(s): PROBNP in the last 8760 hours. HbA1C: No results for input(s): HGBA1C in the last 72 hours. CBG: No results for input(s): GLUCAP in the last 168 hours. Lipid Profile: No results for input(s): CHOL, HDL, LDLCALC, TRIG, CHOLHDL, LDLDIRECT in the last 72 hours. Thyroid Function Tests: No results for input(s): TSH, T4TOTAL, FREET4, T3FREE, THYROIDAB in the last 72 hours. Anemia Panel: No results for input(s): VITAMINB12, FOLATE, FERRITIN, TIBC, IRON, RETICCTPCT in the last 72 hours. Urine analysis:    Component Value Date/Time   COLORURINE YELLOW 03/29/2014 1546   APPEARANCEUR CLEAR 03/29/2014 1546   LABSPEC 1.011 03/29/2014 1546   PHURINE 6.0 03/29/2014 1546   GLUCOSEU NEGATIVE 03/29/2014 1546   HGBUR NEGATIVE 03/29/2014 1546   BILIRUBINUR NEGATIVE 03/29/2014 1546   KETONESUR NEGATIVE 03/29/2014 1546   PROTEINUR NEGATIVE 03/29/2014 1546   UROBILINOGEN 0.2 03/29/2014 1546   NITRITE NEGATIVE 03/29/2014 1546   LEUKOCYTESUR NEGATIVE 03/29/2014 1546   Sepsis Labs: Recent Results (from the past 240 hour(s))  SARS Coronavirus 2 [facility] order, Performed in [place] hospital lab)     Status: None   Collection Time: 12/24/18  8:49 AM  Result Value Ref Range Status   SARS Coronavirus 2 NEGATIVE NEGATIVE Final    Comment: (NOTE) If result is NEGATIVE SARS-CoV-2 target nucleic acids are NOT DETECTED. The SARS-CoV-2 RNA is generally detectable in upper and lower  respiratory specimens during the acute phase of infection. The lowest  concentration of SARS-CoV-2 viral copies this assay can detect is 250  copies / mL. A negative  result does not preclude SARS-CoV-2 infection  and should not be used as the sole basis for treatment or other  patient  management decisions.  A negative result may occur with  improper specimen collection / handling, submission of specimen other  than nasopharyngeal swab, presence of viral mutation(s) within the  areas targeted by this assay, and inadequate number of viral copies  (<250 copies / mL). A negative result must be combined with clinical  observations, patient history, and epidemiological information. If result is POSITIVE SARS-CoV-2 target nucleic acids are DETECTED. The SARS-CoV-2 RNA is generally detectable in upper and lower  respiratory specimens dur ing the acute phase of infection.  Positive  results are indicative of active infection with SARS-CoV-2.  Clinical  correlation with patient history and other diagnostic information is  necessary to determine patient infection status.  Positive results do  not rule out bacterial infection or co-infection with other viruses. If result is PRESUMPTIVE POSTIVE SARS-CoV-2 nucleic acids MAY BE PRESENT.   A presumptive positive result was obtained on the submitted specimen  and confirmed on repeat testing.  While 2019 novel coronavirus  (SARS-CoV-2) nucleic acids may be present in the submitted sample  additional confirmatory testing may be necessary for epidemiological  and / or clinical management purposes  to differentiate between  SARS-CoV-2 and other Sarbecovirus currently known to infect humans.  If clinically indicated additional testing with an alternate test  methodology [phone]) is advised. The SARS-CoV-2 RNA is generally  detectable in upper and lower respiratory sp ecimens during the acute  phase of infection. The expected result is Negative. Fact Sheet for Patients:  StrictlyIdeas.no Fact Sheet for Healthcare Providers: BankingDealers.co.za This test is not yet approved or cleared by the [place] FDA and has been authorized for detection and/or diagnosis of SARS-CoV-2 by FDA under  an Emergency Use Authorization (EUA).  This EUA will remain in effect (meaning this test can be used) for the duration of the COVID-19 declaration under Section 564(b)(1) of the Act, 21 U.S.C. section 360bbb-3(b)(1), unless the authorization is terminated or revoked sooner. Performed at [facility], [place] [address], [place], [place] [address]      Radiological Exams on Admission: Dg Chest Port 1 View  Result Date: 12/24/2018 CLINICAL DATA:  73 year old with cough and shortness of breath over the past several days. Prior CABG. EXAM: PORTABLE CHEST 1 VIEW COMPARISON:  05/25/2018 and earlier, including CT chest 10/01/2017. FINDINGS: Prior sternotomy for CABG. Cardiac silhouette mildly enlarged for AP portable technique. Calcified BILATERAL hilar and RIGHT paratracheal mediastinal lymph nodes again noted. Interstitial opacities with Kerley B lines and patchy ground-glass airspace opacities in the BILATERAL lung bases and the RIGHT UPPER LOBE. Small BILATERAL pleural effusions. IMPRESSION: Asymmetric airspace pulmonary edema, RIGHT greater than LEFT, versus atypical pneumonia, including viral pneumonia. Asymmetric mild CHF is favored, since there are associated small BILATERAL pleural effusions. Electronically Signed   By: Evangeline Dakin M.D.   On: 12/24/2018 09:22    EKG: Independently reviewed.  Sinus rhythm at 80 bpm with right axis deviation and mild ST depression.  Assessment/Plan Symptomatic anemia: On admission hemoglobin down from 12.2 g/dL in 04/2018 to 6.3 g/dL.  Stool guaiacs were noted to be negative.  Patient ordered to be transfused 2 units of packed red blood cells.  Patient with previous history of duodenal ulcer and angioectasis of the stomach that were treated with APC by GI.  Started on a Protonix drip and ordered 2 units of packed red  blood cells. -Admit to a telemetry bed -Follow-up anemia panel -Restart ferrous sulfate -Recheck blood counts post treatment transfusion  -If hemoglobin noted to continue to have drop posttransfusion will need to consult gastroenterology  Diastolic congestive heart failure exacerbation: Acute on chronic.  Patient presents with complaints of shortness of breath.  Chest x-ray showing asymmetric pulmonary edema with pleural effusions.  BNP mildly elevated at 244.7.  Initial troponin noted to be negative.  Patient notes decreased compliance with dietary restrictions.  Previously followed by Dr. Cathie Olden, but has not followed up in 2-3 years. -Strict intake and output -Daily weights -Trend troponins x2 -Continue 80 mg of Lasix IV twice daily -Daily BMP -Determine if need of repeat echocardiogram/formal cardiology consult needed inpatient  Dyspnea : Patient with increased work of breathing, but able to maintain O2 saturations on room air.  Symptoms appear multifactorial given anemia and pulmonary edema with pleural effusions.  O2 saturations currently will be maintained on room air.  Patient placed on nasal cannula oxygen for comfort. -Nasal cannula oxygen as needed  COPD: Patient without acute exacerbation noted on physical exam.  Shortness of breath symptoms likely related with above.  Patient reports inability to afford albuterol inhaler.  No longer reported to be smoking. -Albuterol nebs as needed  Diabetes mellitus type 2: Last available hemoglobin A1c 6.2 on 04/20/2018.  Home medications include metformin.  Blood glucose mildly elevated at admission of 198.  Suspect likely acutely elevated due to acute stress. -Hypoglycemia protocol  -Hold metformin -CBGs q. before meals and at bedtime with sensitive SSI.  CAD: Patient with prior history of CABG x3 vessels. -Restart aspirin when medically appropriate  Hepatic cirrhosis: Patient appears to be fluid overload.  Lower extremity swelling could also be related with hepatic cirrhosis.  Unclear if patient was taking spironolactone at this time. -Will likely need to restart  spironolactone when medically appropriate  Hyponatremia: Acute on chronic.  On admission sodium 133.  Patient appears to be fluid overloaded so this time suspect hypervolemic hyponatremia. -Check urine osmolarity and urine sodium  Thrombocytopenia: Chronic.  Platelet count 137 with no active source of bleeding.  Likely related with history of hepatic cirrhosis.   -Continue to monitor  History of duodenal ulcer and angioectasias: Last EGD by Dr. Watt Climes in 11/2017, found to have nonbleeding angioectasis of the stomach treated with APC.  Prior to that EGD in 09/2017 revealed duodenal ulcer. -Continue Protonix daily   History of alcohol abuse: Per son he only drinks 16 ounce of beer 2 or 3 times per week. -CIWA protocols without scheduled Ativan  Morbid obesity: BMI of 40.72 kg/m -Continue to counsel on need of dietary restriction  DVT prophylaxis: SCD Code Status: Full Family Communication: No family present at bedside, but discussed case with his son over the phone. Disposition Plan: Likely discharge home in 1 to 2 days Consults called: None Admission status: Observation  Norval Morton MD [facility] Pager [phone]   If 7PM-7AM, please contact night-coverage [email or web address] Password [facility]  12/24/2018, 11:07 AM

## 2018-12-24 NOTE — Progress Notes (Signed)
Patient is receiving blood transfusion with no signs and symptoms of reaction.

## 2018-12-24 NOTE — Care Management Obs Status (Signed)
[place] NOTIFICATION   Patient Details  Name: [name] MRN: [id number] Date of Birth: [birth year]   Medicare Observation Status Notification Given:  Yes(telephone consent from son with pt permission; all questions answered)    Royston Bake, RN 12/24/2018, 2:34 PM

## 2018-12-24 NOTE — TOC Initial Note (Addendum)
Transition of Care [facility]) - Initial/Assessment Note    Patient Details  Name: [name] MRN: [id number] Date of Birth: 1946/03/18  Transition of Care [facility]) CM/SW Contact:    Sherrilyn Rist Phone Number: [phone] 12/24/2018, 2:38 PM  Clinical Narrative:                 CM talked to patient, he requested that I talk to his son [name]; Durward Fortes; PCP: [name], MD; has private insurance with [employer] with prescription drug coverage; patient gets a 3 month supply of medication- he ran out of his medication because he did not tell his son until he was almost out of his medication. CM talked to son about home oxygen- [name] stated that patient is not on oxygen at this time, his mother/ spouse of patient is on oxygen and CPAP; her oxygen is broken and cannot use it due to the wiring of the home. They have talked to the [facility] about rewiring the home but he is refusing to do this due to the expense; The home cannot handle both pt and spouse being on oxygen. CM asked son again is he on oxygen, [name] stated no.No DME at this time. Difficult situation with spouse/ CM will explore more.   2:48 pm CM talked to Brad with [employer] - home oxygen- Situation explained; Brad instructed CM to inform the pt / family to notify the [employer] to assist with cost and to call [employer] to inform them that she need oxygen to support her life; family to go to [employer] to complete paperwork with the appropriate documentation that is needed for home oxygen. Mindi Slicker RN,MHA,BSN  Expected Discharge Plan: Home/Self Care Barriers to Discharge: No Barriers Identified   Patient Goals and CMS Choice Patient states their goals for this hospitalization and ongoing recovery are:: to breathe better CMS Medicare.gov Compare Post Acute Care list provided to:: Patient Choice offered to / list presented to : NA  Expected Discharge Plan and Services Expected Discharge Plan:  Home/Self Care In-house Referral: NA Discharge Planning Services: CM Consult Post Acute Care Choice: NA                   DME Arranged: N/A DME Agency: NA       HH Arranged: NA HH Agency: NA        Prior Living Arrangements/Services   Lives with:: Spouse Patient language and need for interpreter reviewed:: Yes Do you feel safe going back to the place where you live?: Yes      Need for Family Participation in Patient Care: Yes (Comment)(Patient cannot read or write, his son manages his medication / MD apts/ errands) Care giver support system in place?: Yes (comment)   Criminal Activity/Legal Involvement Pertinent to Current Situation/Hospitalization: No - Comment as needed  Activities of Daily Living      Permission Sought/Granted Permission sought to share information with : Case Manager Permission granted to share information with : Yes, Verbal Permission Granted  Share Information with NAME: spouse and son [name]           Emotional Assessment Appearance:: Developmentally appropriate Attitude/Demeanor/Rapport: Gracious, Engaged Affect (typically observed): Accepting Orientation: : Oriented to Self, Oriented to  Time, Oriented to Place, Oriented to Situation Alcohol / Substance Use: Not Applicable Psych Involvement: No (comment)  Admission diagnosis:  Precordial chest pain [R07.2] SOB (shortness of breath) [R06.02] Symptomatic anemia [D64.9] Patient Active Problem List   Diagnosis  Date Noted  . Hyponatremia 12/24/2018  . Acute on chronic diastolic CHF (congestive heart failure) ([place]) 12/24/2018  . Precordial chest pain   . Neck pain 09/12/2018  . CKD (chronic kidney disease), stage III ([place]) 12/18/2017  . Symptomatic anemia 12/18/2017  . Peptic ulcer disease 12/18/2017  . GI bleed 09/29/2017  . SOB (shortness of breath) 09/21/2017  . Morbid obesity ([place]) 02/05/2016  . Numerous moles 09/15/2015  . S/P CABG x 3 03/31/2014  . Hepatic cirrhosis ([place])  02/27/2014  . Hyperlipidemia with target LDL less than 70 06/29/2012  . Thrombocytopenia ([place]) 06/29/2012  . Coronary artery disease    . OSA (obstructive sleep apnea)   . Type 2 diabetes with complication ([place]) 16/05/9603  . GERD 08/04/2010  . History of gout   . Hypertensive heart disease    PCP:  [name], MD Pharmacy:   [facility] [address], [address] AT [address] [place] [address] Phone: [phone] Fax: [phone]     Social Determinants of Health (SDOH) Interventions    Readmission Risk Interventions No flowsheet data found.

## 2018-12-24 NOTE — ED Notes (Signed)
Date and time results received: 12/24/18 0934  (use smartphrase ".now" to insert current time)  Test: HGB Critical Value: 6.3  Name of Provider Notified: Nanda Quinton, MD  Orders Received? Or Actions Taken?:

## 2018-12-24 NOTE — ED Provider Notes (Signed)
Emergency Department Provider Note   I have reviewed the triage vital signs and the nursing notes.   HISTORY  Chief Complaint No chief complaint on file.   HPI [name] is a 73 y.o. male with PMH of CHF, CAD, GERD, Asthma, and HLD with elevated BMI presents to the emergency department with progressively worsening shortness of breath over the past 2 days.  He has associated cough.  Patient denies any fever, chills.  No sick contacts.  He has been staying at home and away from others.  He denies any body aches, headaches.  Patient states he has been out of his Lasix over the past 2 days.  He has also not had access to his albuterol inhaler because he states he cannot afford this location.  He has developed some intermittent left-sided chest tightness since last night but no active chest pain.  He has noticed worsening swelling in his legs laterally.  Denies specific pain.  He states he was encouraged to present to the emergency department today by his son.   Past Medical History:  Diagnosis Date   Arthritis    "joints tighten up sometimes" (03/27/2104)   Asthma    CHF (congestive heart failure) (HCC)    Chronic low back pain    Chronic lower back pain    Coronary artery disease    a. 05/2012 Cath/PCI: LM 40ost, 50-60d, LAD 99p ruptured plaque (3.0x28 DES), LCX 50p/m, RCA 30-40p, 47m, EF 65-70%.   Family history of adverse reaction to anesthesia    Pt granddaughter had PONV   GERD (gastroesophageal reflux disease)    Hepatic cirrhosis ([place])    a. Dx 01/2014 - CT a/p    History of blood transfusion    "related to bleeding ulcers"   History of concussion    1976--  NO RESIDUAL   History of GI bleed    a. UGIB 07/2012;  b. 01/2014 admission with GIB/FOB stool req 1U prbc's->EGD showed portal gastropathy, barrett's esoph, and chronic active h. pylori gastritis.   History of gout    2007 &  2008  LEFT LEG-- NO ISSUE SINCE   Hyperlipidemia    Iron deficiency anemia     Kidney stones    Lipoma    OA (osteoarthritis of spine)    LOWER BACK--  INTERMITTANT LEFT LEG NUMBNESS   OSA (obstructive sleep apnea)    PULMOLOGIST-  DR CLANCE--  MODERATE OSA  STARTED CPAP 2012--  BUT CURRENTLY HAS NOT USED PAST 6 MONTHS   Phimosis    a. s/p circumcision 2015.   Type 2 diabetes mellitus ([place])    Unspecified essential hypertension     Patient Active Problem List   Diagnosis Date Noted   Hyponatremia 12/24/2018   Acute on chronic diastolic CHF (congestive heart failure) ([place]) 12/24/2018   Precordial chest pain    Neck pain 09/12/2018   CKD (chronic kidney disease), stage III ([place]) 12/18/2017   Symptomatic anemia 12/18/2017   Peptic ulcer disease 12/18/2017   GI bleed 09/29/2017   SOB (shortness of breath) 09/21/2017   Morbid obesity ([place]) 02/05/2016   Numerous moles 09/15/2015   S/P CABG x 3 03/31/2014   Hepatic cirrhosis ([place]) 02/27/2014   Hyperlipidemia with target LDL less than 70 06/29/2012   Thrombocytopenia ([place]) 06/29/2012   Coronary artery disease     OSA (obstructive sleep apnea)    Type 2 diabetes with complication ([place]) 69/48/5462   GERD 08/04/2010   History of gout  Hypertensive heart disease     Past Surgical History:  Procedure Laterality Date   ANKLE FRACTURE SURGERY Right 1989   "plate put in"   APPENDECTOMY  05-16-2004   open   CIRCUMCISION N/A 09/09/2013   Procedure: CIRCUMCISION ADULT;  Surgeon: Bernestine Amass, MD;  Location: [facility];  Service: Urology;  Laterality: N/A;   COLECTOMY  05-16-2004   CORONARY ANGIOPLASTY WITH STENT PLACEMENT  06/28/2012  DR COOPER   PCI W/  X1 DES to [place]. LAD/  LM  40% OSTIAL & 50-60% DISTAL /  50% PROX LCX/  30-40% PROX RCA & 50% MID RCA/   LVEF 65-70%   CORONARY ARTERY BYPASS GRAFT N/A 03/31/2014   Procedure: CORONARY ARTERY BYPASS GRAFTING (CABG) times 3 using left internal mammary artery and right saphenous vein.;  Surgeon: Melrose Nakayama, MD;  Location: [place];  Service: Open Heart Surgery;  Laterality: N/A;   DOBUTAMINE STRESS ECHO  06-08-2012   MODERATE HYPOKINESIS/ ISCHEMIA MID INFERIOR WALL   ESOPHAGOGASTRODUODENOSCOPY  08/15/2012   Procedure: ESOPHAGOGASTRODUODENOSCOPY (EGD);  Surgeon: Wonda Horner, MD;  Location[facility] ENDOSCOPY;  Service: Endoscopy;  Laterality: N/A;   ESOPHAGOGASTRODUODENOSCOPY N/A 02/17/2014   Procedure: ESOPHAGOGASTRODUODENOSCOPY (EGD);  Surgeon: Jeryl Columbia, MD;  Location: [facility] ENDOSCOPY;  Service: Endoscopy;  Laterality: N/A;   ESOPHAGOGASTRODUODENOSCOPY (EGD) WITH PROPOFOL N/A 09/30/2017   Procedure: ESOPHAGOGASTRODUODENOSCOPY (EGD) WITH PROPOFOL;  Surgeon: Wonda Horner, MD;  Location: [facility] ENDOSCOPY;  Service: Endoscopy;  Laterality: N/A;   ESOPHAGOGASTRODUODENOSCOPY (EGD) WITH PROPOFOL N/A 12/20/2017   Procedure: ESOPHAGOGASTRODUODENOSCOPY (EGD) WITH PROPOFOL;  Surgeon: Clarene Essex, MD;  Location: [place];  Service: Endoscopy;  Laterality: N/A;   HERNIA REPAIR     INTRAOPERATIVE TRANSESOPHAGEAL ECHOCARDIOGRAM N/A 03/31/2014   Procedure: INTRAOPERATIVE TRANSESOPHAGEAL ECHOCARDIOGRAM;  Surgeon: Melrose Nakayama, MD;  Location: [place];  Service: Open Heart Surgery;  Laterality: N/A;   LAPAROSCOPIC UMBILICAL HERNIA REPAIR W/ MESH  06-06-2011   LEFT HEART CATHETERIZATION WITH CORONARY ANGIOGRAM N/A 03/28/2014   Procedure: LEFT HEART CATHETERIZATION WITH CORONARY ANGIOGRAM;  Surgeon: Sinclair Grooms, MD;  Location: [facility] CATH LAB;  Service: Cardiovascular;  Laterality: N/A;   LIPOMA EXCISION Left 08/25/2017   Procedure: EXCISION LIPOMA LEFT POSTERIOR THIGH;  Surgeon: Clovis Riley, MD;  Location: [place];  Service: General;  Laterality: Left;   NEPHROLITHOTOMY  1990'S   OPEN APPENDECTOMY W/ PARTIAL CECECTOMY  05-16-2004   PERCUTANEOUS CORONARY STENT INTERVENTION (PCI-S) N/A 06/28/2012   Procedure: PERCUTANEOUS CORONARY STENT INTERVENTION (PCI-S);  Surgeon: Sherren Mocha, MD;   Location: [facility] C[facility] LAB;  Service: Cardiovascular;  Laterality: N/A;    Allergies Patient has no known allergies.  Family History  Problem Relation Age of Onset   Lung cancer Sister    Cancer Sister        lung   Cancer Mother    Cancer Father        died in his 39s.   Cancer Brother        lung   Coronary artery disease Other    Diabetes Other    Colon cancer Other    Cancer Sister        lung    Social History Social History   Tobacco Use   Smoking status: Former Smoker    Packs/day: 2.00    Years: 40.00    Pack years: 80.00    Types: Cigarettes    Last attempt to quit: 08/29/1996    Years since quitting:  22.3   Smokeless tobacco: Never Used  Substance Use Topics   Alcohol use: Yes    Alcohol/week: 0.0 standard drinks    Comment: occasional   Drug use: No    Review of Systems  Constitutional: No fever/chills Eyes: No visual changes. ENT: No sore throat. Cardiovascular: Positive chest pain and bilateral leg swelling.  Respiratory: Positive shortness of breath. Gastrointestinal: No abdominal pain.  No nausea, no vomiting.  No diarrhea.  No constipation. Genitourinary: Negative for dysuria. Musculoskeletal: Negative for back pain. Skin: Negative for rash. Neurological: Negative for headaches, focal weakness or numbness.  10-point ROS otherwise negative.  ____________________________________________   PHYSICAL EXAM:  VITAL SIGNS: ED Triage Vitals [12/24/18 0840]  Enc Vitals Group     BP (!) 158/70     Pulse Rate 80     Resp (!) 21     Temp 98.2 F (36.8 C)     Temp Source Oral     SpO2 100 %   Constitutional: Alert and oriented. Well appearing and in no acute distress. Eyes: Conjunctivae are normal.  Head: Atraumatic. Nose: No congestion/rhinnorhea. Mouth/Throat: Mucous membranes are moist.  Neck: No stridor.   Cardiovascular: Normal rate, regular rhythm. Good peripheral circulation. Grossly normal heart sounds.   Respiratory:  Increased respiratory effort but speaking in full sentences.  No retractions. Lungs with crackles at the bases and faint wheezing at the apices.  Gastrointestinal: Soft and nontender. No distention. Rectal exam performed with chaperone. No gross blood or melena. Hemoccult negative.  Musculoskeletal: No lower extremity tenderness with 3+ pitting edema in the bilateral LEs. No gross deformities of extremities. Neurologic:  Normal speech and language. No gross focal neurologic deficits are appreciated.  Skin:  Skin is warm, dry and intact. No rash noted.  ____________________________________________   LABS (all labs ordered are listed, but only abnormal results are displayed)  Labs Reviewed  CBC WITH DIFFERENTIAL/PLATELET - Abnormal; Notable for the following components:      Result Value   RBC 3.05 (*)    Hemoglobin 6.3 (*)    HCT 22.9 (*)    MCV 75.1 (*)    MCH 20.7 (*)    MCHC 27.5 (*)    RDW 19.5 (*)    Platelets 137 (*)    Lymphs Abs 0.6 (*)    All other components within normal limits  COMPREHENSIVE METABOLIC PANEL - Abnormal; Notable for the following components:   Sodium 133 (*)    CO2 21 (*)    Glucose, Bld 197 (*)    Calcium 8.7 (*)    Total Bilirubin 1.4 (*)    All other components within normal limits  BRAIN NATRIURETIC PEPTIDE - Abnormal; Notable for the following components:   B Natriuretic Peptide 244.7 (*)    All other components within normal limits  IRON AND TIBC - Abnormal; Notable for the following components:   Iron 24 (*)    TIBC 536 (*)    Saturation Ratios 4 (*)    All other components within normal limits  FERRITIN - Abnormal; Notable for the following components:   Ferritin 14 (*)    All other components within normal limits  RETICULOCYTES - Abnormal; Notable for the following components:   Retic Ct Pct 3.5 (*)    RBC. 3.09 (*)    Immature Retic Fract 29.8 (*)    All other components within normal limits  GLUCOSE, CAPILLARY - Abnormal; Notable for  the following components:   Glucose-Capillary 158 (*)  All other components within normal limits  GLUCOSE, CAPILLARY - Abnormal; Notable for the following components:   Glucose-Capillary 228 (*)    All other components within normal limits  SARS CORONAVIRUS 2 (HOSPITAL ORDER, [place] [facility])  TROPONIN I  VITAMIN B12  FOLATE  TSH  TROPONIN I  TROPONIN I  SODIUM, URINE, RANDOM  OSMOLALITY, URINE  BASIC METABOLIC PANEL  CBC WITH DIFFERENTIAL/PLATELET  MAGNESIUM  HEMOGLOBIN AND HEMATOCRIT, BLOOD  POC OCCULT BLOOD, ED  PREPARE RBC (CROSSMATCH)  TYPE AND SCREEN   ____________________________________________  EKG   EKG Interpretation  Date/Time:  Monday December 24 2018 08:44:27 EDT Ventricular Rate:  80 PR Interval:    QRS Duration: 101 QT Interval:  407 QTC Calculation: 470 R Axis:   104 Text Interpretation:  Sinus rhythm.  Right axis deviation Minimal ST depression, lateral leads No STEMI.  Confirmed by Nanda Quinton [phone]) on 12/24/2018 8:52:05 AM       ____________________________________________  RADIOLOGY  Dg Chest Port 1 View  Result Date: 12/24/2018 CLINICAL DATA:  73 year old with cough and shortness of breath over the past several days. Prior CABG. EXAM: PORTABLE CHEST 1 VIEW COMPARISON:  05/25/2018 and earlier, including CT chest 10/01/2017. FINDINGS: Prior sternotomy for CABG. Cardiac silhouette mildly enlarged for AP portable technique. Calcified BILATERAL hilar and RIGHT paratracheal mediastinal lymph nodes again noted. Interstitial opacities with Kerley B lines and patchy ground-glass airspace opacities in the BILATERAL lung bases and the RIGHT UPPER LOBE. Small BILATERAL pleural effusions. IMPRESSION: Asymmetric airspace pulmonary edema, RIGHT greater than LEFT, versus atypical pneumonia, including viral pneumonia. Asymmetric mild CHF is favored, since there are associated small BILATERAL pleural effusions. Electronically Signed   By:  Evangeline Dakin M.D.   On: 12/24/2018 09:22    ____________________________________________   PROCEDURES  Procedure(s) performed:   Procedures  CRITICAL CARE Performed by: Margette Fast Total critical care time: 45 minutes Critical care time was exclusive of separately billable procedures and treating other patients. Critical care was necessary to treat or prevent imminent or life-threatening deterioration. Critical care was time spent personally by me on the following activities: development of treatment plan with patient and/or surrogate as well as nursing, discussions with consultants, evaluation of patient's response to treatment, examination of patient, obtaining history from patient or surrogate, ordering and performing treatments and interventions, ordering and review of laboratory studies, ordering and review of radiographic studies, pulse oximetry and re-evaluation of patient's condition.  Nanda Quinton, MD Emergency Medicine  ____________________________________________   INITIAL IMPRESSION / ASSESSMENT AND PLAN / ED COURSE  Pertinent labs & imaging results that were available during my care of the patient were reviewed by me and considered in my medical decision making (see chart for details).   Patient presents to the emergency department with shortness of breath.  My suspicion for COVID is low.  He does not have hypoxemia.  Patient does have some increased work of breathing.  Appears mild to moderately volume overloaded.  Patient also with some wheezing.  Plan for albuterol MDI and likely need for diuresis.  He does have increased cough over the past several days.  I am adding on a rapid COVID test and will keep the patient on Contact/Droplet precautions for now. No high risk aerosolizing procedures currently.   10:59 AM  Patient with anemia here.  Has had GI bleeding in the past but no evidence of this on exam. Rapid COVID test is negative. CXR results reviewed. Doubt  atypical PNA.  Suspect fluid clinically. No abx for now. Will give lasix and PRBC along with Protonix.   Discussed patient's case with Hospitalist to request admission. Patient and family (if present) updated with plan. Care transferred to Hospitalist service.  I reviewed all nursing notes, vitals, pertinent old records, EKGs, labs, imaging (as available).   HARLAN ERVINE was evaluated in Emergency Department on 12/24/2018 for the symptoms described in the history of present illness. He was evaluated in the context of the global COVID-19 pandemic, which necessitated consideration that the patient might be at risk for infection with the SARS-CoV-2 virus that causes COVID-19. Institutional protocols and algorithms that pertain to the evaluation of patients at risk for COVID-19 are in a state of rapid change based on information released by regulatory bodies including the CDC and federal and state organizations. These policies and algorithms were followed during the patient's care in the ED.  ____________________________________________  FINAL CLINICAL IMPRESSION(S) / ED DIAGNOSES  Final diagnoses:  SOB (shortness of breath)  Symptomatic anemia  Precordial chest pain     MEDICATIONS GIVEN DURING THIS VISIT:  Medications  0.9 %  sodium chloride infusion (has no administration in time range)  nitroGLYCERIN (NITROSTAT) SL tablet 0.4 mg (has no administration in time range)  polyethylene glycol (MIRALAX / GLYCOLAX) packet 17 g (has no administration in time range)  gabapentin (NEURONTIN) capsule 100 mg (100 mg Oral Given 12/24/18 1534)  sodium chloride flush (NS) 0.9 % injection 3 mL (has no administration in time range)  ondansetron (ZOFRAN) tablet 4 mg (has no administration in time range)    Or  ondansetron (ZOFRAN) injection 4 mg (has no administration in time range)  acetaminophen (TYLENOL) tablet 650 mg (has no administration in time range)    Or  acetaminophen (TYLENOL) suppository 650  mg (has no administration in time range)  furosemide (LASIX) injection 80 mg (80 mg Intravenous Given 12/24/18 1812)  insulin aspart (novoLOG) injection 0-9 Units (3 Units Subcutaneous Given 12/24/18 1730)  insulin aspart (novoLOG) injection 0-5 Units (has no administration in time range)  pantoprazole (PROTONIX) EC tablet 40 mg (has no administration in time range)  albuterol (PROVENTIL) (2.5 MG/3ML) 0.083% nebulizer solution 2.5 mg (has no administration in time range)  LORazepam (ATIVAN) tablet 1 mg (has no administration in time range)    Or  LORazepam (ATIVAN) injection 1 mg (has no administration in time range)  thiamine (VITAMIN B-1) tablet 100 mg (100 mg Oral Given 12/24/18 1535)    Or  thiamine (B-1) injection 100 mg ( Intravenous See Alternative 0/62/69 4854)  folic acid (FOLVITE) tablet 1 mg (1 mg Oral Given 12/24/18 1534)  multivitamin with minerals tablet 1 tablet (1 tablet Oral Given 12/24/18 1535)  albuterol (VENTOLIN HFA) 108 (90 Base) MCG/ACT inhaler 6 puff (6 puffs Inhalation Given 12/24/18 0910)  furosemide (LASIX) injection 80 mg (80 mg Intravenous Given 12/24/18 1045)  pantoprazole (PROTONIX) 80 mg in sodium chloride 0.9 % 100 mL IVPB (0 mg Intravenous Stopped 12/24/18 1144)    Note:  This document was prepared using Dragon voice recognition software and may include unintentional dictation errors.  Nanda Quinton, MD Emergency Medicine    Estelene Carmack, Wonda Olds, MD 12/24/18 2007

## 2018-12-24 NOTE — ED Notes (Addendum)
[name] [phone] Spoke to son to give update

## 2018-12-25 ENCOUNTER — Telehealth: Payer: Self-pay | Admitting: Internal Medicine

## 2018-12-25 DIAGNOSIS — D649 Anemia, unspecified: Secondary | ICD-10-CM | POA: Diagnosis not present

## 2018-12-25 LAB — BASIC METABOLIC PANEL
Anion gap: 9 (ref 5–15)
BUN: 23 mg/dL (ref 8–23)
CO2: 24 mmol/L (ref 22–32)
Calcium: 8.7 mg/dL — ABNORMAL LOW (ref 8.9–10.3)
Chloride: 100 mmol/L (ref 98–111)
Creatinine, Ser: 1.31 mg/dL — ABNORMAL HIGH (ref 0.61–1.24)
GFR calc Af Amer: >60 mL/min (ref >60)
GFR calc non Af Amer: 54 mL/min — ABNORMAL LOW (ref >60)
Glucose, Bld: 135 mg/dL — ABNORMAL HIGH (ref 70–99)
Potassium: 4.2 mmol/L (ref 3.5–5.1)
Sodium: 133 mmol/L — ABNORMAL LOW (ref 135–145)

## 2018-12-25 LAB — CBC WITH DIFFERENTIAL/PLATELET
Abs Immature Granulocytes: 0.02 10*3/uL (ref 0.00–0.07)
Basophils Absolute: 0 10*3/uL (ref 0.0–0.1)
Basophils Relative: 1 %
Eosinophils Absolute: 0.2 10*3/uL (ref 0.0–0.5)
Eosinophils Relative: 3 %
HCT: 27 % — ABNORMAL LOW (ref 39.0–52.0)
Hemoglobin: 7.8 g/dL — ABNORMAL LOW (ref 13.0–17.0)
Immature Granulocytes: 0 %
Lymphocytes Relative: 13 %
Lymphs Abs: 0.8 10*3/uL (ref 0.7–4.0)
MCH: 21.7 pg — ABNORMAL LOW (ref 26.0–34.0)
MCHC: 28.9 g/dL — ABNORMAL LOW (ref 30.0–36.0)
MCV: 75 fL — ABNORMAL LOW (ref 80.0–100.0)
Monocytes Absolute: 0.4 10*3/uL (ref 0.1–1.0)
Monocytes Relative: 7 %
Neutro Abs: 4.4 10*3/uL (ref 1.7–7.7)
Neutrophils Relative %: 76 %
Platelets: 139 10*3/uL — ABNORMAL LOW (ref 150–400)
RBC: 3.6 MIL/uL — ABNORMAL LOW (ref 4.22–5.81)
RDW: 19.1 % — ABNORMAL HIGH (ref 11.5–15.5)
WBC: 5.8 10*3/uL (ref 4.0–10.5)
nRBC: 0 % (ref 0.0–0.2)

## 2018-12-25 LAB — HEMOGLOBIN AND HEMATOCRIT, BLOOD
HCT: 26 % — ABNORMAL LOW (ref 39.0–52.0)
HCT: 29.5 % — ABNORMAL LOW (ref 39.0–52.0)
Hemoglobin: 7.5 g/dL — ABNORMAL LOW (ref 13.0–17.0)
Hemoglobin: 8.3 g/dL — ABNORMAL LOW (ref 13.0–17.0)

## 2018-12-25 LAB — MAGNESIUM: Magnesium: 2.2 mg/dL (ref 1.7–2.4)

## 2018-12-25 LAB — GLUCOSE, CAPILLARY
Glucose-Capillary: 129 mg/dL — ABNORMAL HIGH (ref 70–99)
Glucose-Capillary: 205 mg/dL — ABNORMAL HIGH (ref 70–99)

## 2018-12-25 NOTE — Discharge Summary (Signed)
Physician Discharge Summary  [name] BPZ:[id number] DOB: Nov 06, 1945 DOA: 12/24/2018  PCP: John Koch, MD  Admit date: 12/24/2018 Discharge date: 12/25/2018  Admitted From: home Discharge disposition: home   Recommendations for Outpatient Follow-Up:   1. Needs compliance with medications-- did not take lasix for several days 2. Cbc 1 week-- resumed Fe   Discharge Diagnosis:   Principal Problem:   Symptomatic anemia Active Problems:   Type 2 diabetes with complication (HCC)   GERD   Coronary artery disease    Thrombocytopenia (HCC)   Hepatic cirrhosis (HCC)   S/P CABG x 3   SOB (shortness of breath)   Hyponatremia   Acute on chronic diastolic CHF (congestive heart failure) ([place])    Discharge Condition: Improved.  Diet recommendation: Low sodium, heart healthy.  Carbohydrate-modified Wound care: None.  Code status: Full.   History of Present Illness:   [name] is a 73 y.o. male with medical history significant of diastolic CHF last EF 24-23%, hepatic cirrhosis, CAD, DM type II, CKD stage III, and DM type II; who presents with complaints of shortness of breath for last 2 days.  Reports having increased work of breathing with dry cough.  He recently ran out of Lasix, and reports being unable to get a refill until his check comes on 4/30.  [name] notes that he had also been unable to afford his albuterol inhaler.    Hospital Course by Problem:   Symptomatic anemia:  - hemoglobin down from 12.2 g/dL in 04/2018 to 6.3 g/dL.   -Stool guaiacs were noted to be negative -s/p 2 units PRBC with good results and resolution of dyspnea  Diastolic congestive heart failure exacerbation: Acute on chronic.  Patient presents with complaints of shortness of breath.  Chest x-ray showing asymmetric pulmonary edema with pleural effusions.  BNP mildly elevated at 244.7.   -diuresed: 3.5L with improvement in symptoms  Dyspnea : -resolved with PRBC and IV  lasix   Diabetes mellitus type 2: Last available hemoglobin A1c 6.2 on 04/20/2018.  Home medications include metformin.   -outpatient follow up  CAD: Patient with prior history of CABG x3 vessels.  Hepatic cirrhosis:  -resume home meds -alcohol cessation  Thrombocytopenia: Chronic.  Platelet count 137 with no active source of bleeding.  Likely related with history of hepatic cirrhosis.     History of duodenal ulcer and angioectasias: Last EGD by Dr. Watt [name] in 11/2017, found to have nonbleeding angioectasis of the stomach treated with APC.  Prior to that EGD in 09/2017 revealed duodenal ulcer. -defer to PCP if protonix needed   History of alcohol abuse: Per son he only drinks 16 ounce of beer 2 or 3 times per week. -encouraged cessation  Morbid obesity:  Estimated body mass index is 42.91 kg/m as calculated from the following:   Height as of this encounter: 5\' 4"  (1.626 m).   Weight as of this encounter: 113.4 kg.    Medical Consultants:      Discharge Exam:   Vitals:   12/25/18 0800 12/25/18 1119  BP: 132/68 138/61  Pulse: 72 62  Resp: 18 20  Temp: 98 F (36.7 C) 98 F (36.7 C)  SpO2: 99% 98%   Vitals:   12/25/18 0100 12/25/18 0320 12/25/18 0800 12/25/18 1119  BP: (!) 132/59 (!) 119/47 132/68 138/61  Pulse: 65 64 72 62  Resp: 18 18 18 20   Temp: 98.2 F (36.8 C) 98.2 F (36.8 C) 98 F (  36.7 C) 98 F (36.7 C)  TempSrc: Oral Oral Oral Oral  SpO2: 100% 97% 99% 98%  Weight:  113.4 kg    Height:        General exam: Appears calm and comfortable. Walking the hallways w/o symptoms   The results of significant diagnostics from this hospitalization (including imaging, microbiology, ancillary and laboratory) are listed below for reference.     Procedures and Diagnostic Studies:   Dg Chest Port 1 View  Result Date: 12/24/2018 CLINICAL DATA:  73 year old with cough and shortness of breath over the past several days. Prior CABG. EXAM: PORTABLE CHEST 1 VIEW  COMPARISON:  05/25/2018 and earlier, including CT chest 10/01/2017. FINDINGS: Prior sternotomy for CABG. Cardiac silhouette mildly enlarged for AP portable technique. Calcified BILATERAL hilar and RIGHT paratracheal mediastinal lymph nodes again noted. Interstitial opacities with Kerley B lines and patchy ground-glass airspace opacities in the BILATERAL lung bases and the RIGHT UPPER LOBE. Small BILATERAL pleural effusions. IMPRESSION: Asymmetric airspace pulmonary edema, RIGHT greater than LEFT, versus atypical pneumonia, including viral pneumonia. Asymmetric mild CHF is favored, since there are associated small BILATERAL pleural effusions. Electronically Signed   By: Evangeline Dakin M.D.   On: 12/24/2018 09:22     Labs:   Basic Metabolic Panel: Recent Labs  Lab 12/24/18 0849 12/25/18 0420  NA 133* 133*  K 4.6 4.2  CL 103 100  CO2 21* 24  GLUCOSE 197* 135*  BUN 19 23  CREATININE 1.13 1.31*  CALCIUM 8.7* 8.7*  MG  --  2.2   GFR Estimated Creatinine Clearance: 58.3 mL/min (A) (by C-G formula based on SCr of 1.31 mg/dL (H)). Liver Function Tests: Recent Labs  Lab 12/24/18 0849  AST 28  ALT 19  ALKPHOS 108  BILITOT 1.4*  PROT 7.0  ALBUMIN 3.5   No results for input(s): LIPASE, AMYLASE in the last 168 hours. No results for input(s): AMMONIA in the last 168 hours. Coagulation profile No results for input(s): INR, PROTIME in the last 168 hours.  CBC: Recent Labs  Lab 12/24/18 0849 12/24/18 2049 12/25/18 0420 12/25/18 0654 12/25/18 1251  WBC 5.6  --  5.8  --   --   NEUTROABS 4.3  --  4.4  --   --   HGB 6.3* 6.9* 7.8* 7.5* 8.3*  HCT 22.9* 24.5* 27.0* 26.0* 29.5*  MCV 75.1*  --  75.0*  --   --   PLT 137*  --  139*  --   --    Cardiac Enzymes: Recent Labs  Lab 12/24/18 0849 12/24/18 1135 12/24/18 1700  TROPONINI <0.03 <0.03 <0.03   BNP: Invalid input(s): POCBNP CBG: Recent Labs  Lab 12/24/18 1455 12/24/18 1652 12/24/18 2143 12/25/18 0601 12/25/18 1114   GLUCAP 158* 228* 139* 129* 205*   D-Dimer No results for input(s): DDIMER in the last 72 hours. Hgb A1c No results for input(s): HGBA1C in the last 72 hours. Lipid Profile No results for input(s): CHOL, HDL, LDLCALC, TRIG, CHOLHDL, LDLDIRECT in the last 72 hours. Thyroid function studies Recent Labs    12/24/18 0942  TSH 1.854   Anemia work up Recent Labs    12/24/18 0942 12/24/18 1135  VITAMINB12 275  --   FOLATE 30.9  --   FERRITIN 14*  --   TIBC 536*  --   IRON 24*  --   RETICCTPCT  --  3.5*   Microbiology Recent Results (from the past 240 hour(s))  SARS Coronavirus 2 [facility] order, Performed in [facility] hospital lab)     Status: None   Collection Time: 12/24/18  8:49 AM  Result Value Ref Range Status   SARS Coronavirus 2 NEGATIVE NEGATIVE Final    Comment: (NOTE) If result is NEGATIVE SARS-CoV-2 target nucleic acids are NOT DETECTED. The SARS-CoV-2 RNA is generally detectable in upper and lower  respiratory specimens during the acute phase of infection. The lowest  concentration of SARS-CoV-2 viral copies this assay can detect is 250  copies / mL. A negative result does not preclude SARS-CoV-2 infection  and should not be used as the sole basis for treatment or other  patient management decisions.  A negative result may occur with  improper specimen collection / handling, submission of specimen other  than nasopharyngeal swab, presence of viral mutation(s) within the  areas targeted by this assay, and inadequate number of viral copies  (<250 copies / mL). A negative result must be combined with clinical  observations, patient history, and epidemiological information. If result is POSITIVE SARS-CoV-2 target nucleic acids are DETECTED. The SARS-CoV-2 RNA is generally detectable in upper and lower  respiratory specimens dur ing the acute phase of infection.  Positive  results are indicative of active infection with SARS-CoV-2.  Clinical  correlation with  patient history and other diagnostic information is  necessary to determine patient infection status.  Positive results do  not rule out bacterial infection or co-infection with other viruses. If result is PRESUMPTIVE POSTIVE SARS-CoV-2 nucleic acids MAY BE PRESENT.   A presumptive positive result was obtained on the submitted specimen  and confirmed on repeat testing.  While 2019 novel coronavirus  (SARS-CoV-2) nucleic acids may be present in the submitted sample  additional confirmatory testing may be necessary for epidemiological  and / or clinical management purposes  to differentiate between  SARS-CoV-2 and other Sarbecovirus currently known to infect humans.  If clinically indicated additional testing with an alternate test  methodology [phone]) is advised. The SARS-CoV-2 RNA is generally  detectable in upper and lower respiratory sp ecimens during the acute  phase of infection. The expected result is Negative. Fact Sheet for Patients:  StrictlyIdeas.no Fact Sheet for Healthcare Providers: BankingDealers.co.za This test is not yet approved or cleared by the [place] FDA and has been authorized for detection and/or diagnosis of SARS-CoV-2 by FDA under an Emergency Use Authorization (EUA).  This EUA will remain in effect (meaning this test can be used) for the duration of the COVID-19 declaration under Section 564(b)(1) of the Act, 21 U.S.C. section 360bbb-3(b)(1), unless the authorization is terminated or revoked sooner. Performed at [facility], [place] [address], [place], [place] [address]      Discharge Instructions:   Discharge Instructions    Diet - low sodium heart healthy   Complete by:  As directed    Diet Carb Modified   Complete by:  As directed    Increase activity slowly   Complete by:  As directed      Allergies as of 12/25/2018   No Known Allergies     Medication List    STOP taking these  medications   atorvastatin 40 MG tablet Commonly known as:  LIPITOR   diclofenac sodium 1 % Gel Commonly known as:  Voltaren   ibuprofen 400 MG tablet Commonly known as:  ADVIL   magnesium hydroxide 400 MG/5ML suspension Commonly known as:  MILK OF MAGNESIA     TAKE these medications   acetaminophen 325 MG tablet Commonly known as:  Tylenol Take 2 tablets (650 mg total) by mouth every 6 (six) hours as needed.   Anoro Ellipta 62.5-25 MCG/INH Aepb Generic drug:  umeclidinium-vilanterol INHALE 1 PUFF INTO THE LUNGS DAILY   ferrous sulfate 325 (65 FE) MG EC tablet Take 1 tablet (325 mg total) by mouth daily with breakfast.   furosemide 80 MG tablet Commonly known as:  LASIX Take 1 tablet (80 mg total) by mouth 2 (two) times daily.   gabapentin 100 MG capsule Commonly known as:  NEURONTIN TAKE 1 CAPSULE(100 MG) BY MOUTH TWICE DAILY   metFORMIN 850 MG tablet Commonly known as:  GLUCOPHAGE Take 1 tablet (850 mg total) by mouth 2 (two) times daily with a meal.   nitroGLYCERIN 0.4 MG SL tablet Commonly known as:  NITROSTAT Place 1 tablet (0.4 mg total) under the tongue every 5 (five) minutes as needed for chest pain.   polyethylene glycol 17 g packet Commonly known as:  MIRALAX / GLYCOLAX Take 17 g by mouth 2 (two) times daily. What changed:    when to take this  reasons to take this   spironolactone 50 MG tablet Commonly known as:  ALDACTONE Take 1 tablet (50 mg total) by mouth daily.      Follow-up Information    John Koch, MD Follow up in 1 week(s).   Specialty:  Internal Medicine Why:  cbc Contact information: [place] [address] [phone]            Time coordinating discharge: 25 min  Signed:  Geradine Girt DO  [facility] 12/25/2018, 1:32 PM

## 2018-12-25 NOTE — Progress Notes (Signed)
Pt received 2nd unit of blood. Post H&H scheduled for 0600.

## 2018-12-25 NOTE — Progress Notes (Addendum)
Received consult-pt cannot afford meds. Please see full CM consult from 12/24/2018; pt has private insurance with [employer] with prescription drug coverage; spoke to son via phone as pt requested yesterday, no problems with medication. Mindi Slicker [facility] [phone]

## 2018-12-25 NOTE — Telephone Encounter (Signed)
[name], Network engineer from [place] [facility] called to schedule a hospital follow up for the patient, I transferred the call to [name], [facility] at [place].

## 2018-12-25 NOTE — Consult Note (Signed)
   [facility] Inpatient Consult   12/25/2018  [name] 09/07/1945 [id number]   Spoke with inpatient RNCM regarding post hospital follow up needs.  She thought patient would benefits from follow up calls and a possible social worker follow up for community support.   Spoke with patient who states he doesn't mind if his son is contacted for post hospital follow up needs.  He states, "my son understands things a whole lot better than I do."  Spoke with son [name] at [phone], HIPAA verified.  Explained [name] [facility] services available to assist patient with post hospital issues with rental issues with a [facility] social worker.  [name] states he feels that the information the inpatient [facility] RNCM was helpful and he wanted to "check into that."  He did not feel additional services were needed. Patient to have EMMI HF calls per referral request.  For questions, please contact:  [name], RN BSN [facility] Liaison  [phone] business mobile phone Toll free office [phone]

## 2018-12-26 ENCOUNTER — Telehealth: Payer: Self-pay | Admitting: *Deleted

## 2018-12-26 LAB — BPAM RBC
Blood Product Expiration Date: 202005032359
Blood Product Expiration Date: 202005052359
ISSUE DATE / TIME: 202004271513
ISSUE DATE / TIME: 202004280033
Unit Type and Rh: 5100
Unit Type and Rh: 5100

## 2018-12-26 LAB — TYPE AND SCREEN
ABO/RH(D): O POS
Antibody Screen: NEGATIVE
Unit division: 0
Unit division: 0

## 2018-12-26 NOTE — Telephone Encounter (Signed)
Transition Care Management Follow-up Telephone Call   Date discharged? 12/25/18   How have you been since you were released from the hospital? Spoke w/son he states dad is doing ok. He was not with them at the moment   Do you understand why you were in the hospital? YES   Do you understand the discharge instructions? YES   Where were you discharged to? Home   Items Reviewed:  Medications reviewed: Per son he states hospital stop his atorvastatin, ibuprofen and magnesium  Allergies reviewed: YES  Dietary changes reviewed: YES, heart healthy and carb-modified diet  Referrals reviewed: No referral recommeded   Functional Questionnaire:   Activities of Daily Living (ADLs):   He states he are independent in the following: bathing and hygiene, feeding, continence, grooming, toileting and dressing States he require assistance with the following: ambulation   Any transportation issues/concerns?: NO   Any patient concerns? NO   Confirmed importance and date/time of follow-up visits scheduled YES, (virtual) appt 01/07/19  Provider Appointment booked with Dr. Sharlet Salina  Confirmed with patient if condition begins to worsen call PCP or go to the ER.  Patient was given the office number and encouraged to call back with question or concerns.  : YES

## 2019-01-02 ENCOUNTER — Other Ambulatory Visit: Payer: Self-pay | Admitting: Pharmacist

## 2019-01-02 NOTE — Patient Outreach (Signed)
[facility]) Care Management  01/02/2019  [employer] 01/24/1946 [id number]   Patient was called regarding medication assistance and post discharge medication review. Unfortunately, he did not answer the phone. HIPAA compliant message was left on his voicemail for the mobile number. The number listed as his home phone number was not operational so a voicemail could not be left.  Plan: Send patient an unsuccessful outreach letter. Call patient back in 3-5 business days.   Elayne Guerin, PharmD, [place] Clinical Pharmacist [phone]

## 2019-01-03 ENCOUNTER — Other Ambulatory Visit: Payer: Self-pay | Admitting: *Deleted

## 2019-01-03 NOTE — Patient Outreach (Signed)
[facility]) Care Management  01/03/2019  COREYON NICOTRA [birth year] [id number]    Telephone Assessment Transition of care completed by Primary Provider.  RN contacted pt earlier and confirmed identifiers however pt requested a call back for later today. RN requested RN called back however was unsuccessful with this outreach. RN has sent an Economist and was able to leave a HIPAA approved voice messages requesting a call back.  Will further engage upon a return call or successful outreach from this pt. Will rescheduled another call within the next 4 days.   [name], RN Care Management Coordinator [place] Office [phone]

## 2019-01-03 NOTE — Patient Outreach (Signed)
[facility]) Care Management  01/03/2019  [name] [birth year] [id number]  TOC completed by primary provider's office.   RN initial outreach call to pt however unsuccessful. Pt verified identifiers as RN introduced the [facility] program and available services. RN inquired if this was a good time however pt requested a call back later today. Will follow up accordingly and further inquired on pt's needs at that time.  Pt verified his provider office did contact him since his recent discharged.  [name], RN Care Management Coordinator [place] Office [phone]

## 2019-01-07 ENCOUNTER — Ambulatory Visit (INDEPENDENT_AMBULATORY_CARE_PROVIDER_SITE_OTHER): Payer: PPO | Admitting: Internal Medicine

## 2019-01-07 ENCOUNTER — Encounter: Payer: Self-pay | Admitting: Internal Medicine

## 2019-01-07 ENCOUNTER — Other Ambulatory Visit (INDEPENDENT_AMBULATORY_CARE_PROVIDER_SITE_OTHER): Payer: PPO

## 2019-01-07 VITALS — BP 138/70 | HR 73 | Temp 97.9°F | Ht 64.0 in | Wt 249.0 lb

## 2019-01-07 DIAGNOSIS — I5033 Acute on chronic diastolic (congestive) heart failure: Secondary | ICD-10-CM | POA: Diagnosis not present

## 2019-01-07 DIAGNOSIS — D62 Acute posthemorrhagic anemia: Secondary | ICD-10-CM

## 2019-01-07 LAB — COMPREHENSIVE METABOLIC PANEL
ALT: 15 U/L (ref 0–53)
AST: 22 U/L (ref 0–37)
Albumin: 4.2 g/dL (ref 3.5–5.2)
Alkaline Phosphatase: 154 U/L — ABNORMAL HIGH (ref 39–117)
BUN: 24 mg/dL — ABNORMAL HIGH (ref 6–23)
CO2: 26 mEq/L (ref 19–32)
Calcium: 9.2 mg/dL (ref 8.4–10.5)
Chloride: 101 mEq/L (ref 96–112)
Creatinine, Ser: 1.14 mg/dL (ref 0.40–1.50)
GFR: 63 mL/min (ref >60.00)
Glucose, Bld: 170 mg/dL — ABNORMAL HIGH (ref 70–99)
Potassium: 4 mEq/L (ref 3.5–5.1)
Sodium: 137 mEq/L (ref 135–145)
Total Bilirubin: 0.5 mg/dL (ref 0.2–1.2)
Total Protein: 7.5 g/dL (ref 6.0–8.3)

## 2019-01-07 LAB — CBC
HCT: 27.8 % — ABNORMAL LOW (ref 39.0–52.0)
Hemoglobin: 8.5 g/dL — ABNORMAL LOW (ref 13.0–17.0)
MCHC: 30.8 g/dL (ref 30.0–36.0)
MCV: 71 fl — ABNORMAL LOW (ref 78.0–100.0)
Platelets: 169 10*3/uL (ref 150.0–400.0)
RBC: 3.91 Mil/uL — ABNORMAL LOW (ref 4.22–5.81)
RDW: 21.6 % — ABNORMAL HIGH (ref 11.5–15.5)
WBC: 4.9 10*3/uL (ref 4.0–10.5)

## 2019-01-07 LAB — MAGNESIUM: Magnesium: 2.1 mg/dL (ref 1.5–2.5)

## 2019-01-07 LAB — FERRITIN: Ferritin: 16.5 ng/mL — ABNORMAL LOW (ref 22.0–322.0)

## 2019-01-07 NOTE — Assessment & Plan Note (Signed)
Checking CMP and magnesium and replete if needed. Diet is not low sodium and he has spotty compliance with meds overall.

## 2019-01-07 NOTE — Progress Notes (Signed)
   Subjective:   Patient ID: [name], male    DOB: 04/20/1946, 73 y.o.   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 Since leaving the hospital he has been doing okay. Is having some more cramping in his legs. Not taking anything for this. Denies vomiting or nausea or blood in stool or dark stools. Having some mild SOB which is about his baseline. Weight is stable from prior. Tested for covid-19 at hospital admission which was negative. Taking medications as prescribed. Diet is not low sodium.   PMH, [facility], social history reviewed and updated.   Review of Systems  Constitutional: Positive for activity change and fatigue. Negative for appetite change, chills, fever and unexpected weight change.  HENT: Negative.   Eyes: Negative.   Respiratory: Positive for shortness of breath. Negative for cough and chest tightness.        Stable  Cardiovascular: Negative for chest pain, palpitations and leg swelling.  Gastrointestinal: Negative for abdominal distention, abdominal pain, anal bleeding, blood in stool, constipation, diarrhea, nausea, rectal pain and vomiting.  Musculoskeletal: Negative.   Skin: Negative.   Neurological: Negative.   Psychiatric/Behavioral: Negative.     Objective:  Physical Exam Constitutional:      Appearance: He is well-developed.     Comments: Chronically ill appearing  HENT:     Head: Normocephalic and atraumatic.  Neck:     Musculoskeletal: Normal range of motion.  Cardiovascular:     Rate and Rhythm: Normal rate and regular rhythm.  Pulmonary:     Effort: Pulmonary effort is normal. No respiratory distress.     Breath sounds: Normal breath sounds. No wheezing or rales.     Comments: Stable exam Abdominal:     General: Bowel sounds are normal. There  is distension.     Palpations: Abdomen is soft.     Tenderness: There is no abdominal tenderness. There is no rebound.     Comments: Obese, without fluid wave  Musculoskeletal:        General: Swelling present.     Comments: 1-2+ edema to mid shins bilaterally  Skin:    General: Skin is warm and dry.  Neurological:     Mental Status: He is alert and oriented to person, place, and time.     Coordination: Coordination normal.     Vitals:   01/07/19 1317  BP: 138/70  Pulse: 73  Temp: 97.9 F (36.6 C)  TempSrc: Oral  SpO2: 98%  Weight: 249 lb (112.9 kg)  Height: 5\' 4"  (1.626 m)    Assessment & Plan:

## 2019-01-07 NOTE — Assessment & Plan Note (Signed)
Checking CBC and ferritin today. No further bleeding or blood loss noted since hospital discharge.

## 2019-01-08 ENCOUNTER — Other Ambulatory Visit: Payer: Self-pay | Admitting: Pharmacist

## 2019-01-08 ENCOUNTER — Ambulatory Visit: Payer: Self-pay | Admitting: Pharmacist

## 2019-01-08 ENCOUNTER — Other Ambulatory Visit: Payer: Self-pay | Admitting: *Deleted

## 2019-01-08 NOTE — Patient Outreach (Signed)
[facility]) Care Management  01/08/2019  CASEY FYE 05/16/1946 [id number]   Patient was called regarding post discharge medication review. Unfortunately, he did not answer the phone. HIPAA compliant message was left on his voicemail.  Today's call was the 2nd unsuccessful call.  Unsuccessful contact letter was sent by [name], RN on 01/03/2019.  Plan: Call patient back in 10-14 business days. Send patient an unsuccessful Economist. Close case if I cannot get in touch with the patient.  Elayne Guerin, PharmD, [place] Clinical Pharmacist [phone]

## 2019-01-08 NOTE — Patient Outreach (Signed)
[facility]) Care Management  01/08/2019  [name] 1945/12/17 [id number]  Telephone Assessment-Declined   RN spoke with the pt's son [name]) the caregiver. RN introduced the [facility] program and purpose for the call. Caregiver verifies identifiers and discussed pt's current medical issues. States pt is doing "as welll as can be expected". RN inquired on pt's medical issues as son indicated pt' takes his BP (HTN) randomly if needed not daily, DM pt is no longer has to monitor this condition and concerning his HF states pt will not weight daily and continues to  Have some swelling to his LE. RN confirmed pt's provider is aware of pt's current symptoms (verified). Pt takes all his prescribed medications and son indicates he takes pt to all his appointments that require a visit.  RN offered to further assist pt with managing his health however caregiver indicates pt will not participate and at this time he is trying to assist the pt with maintaining his health based upon what the pt will allow. Caregiver appreciative however feels pt will not participate. Therefore declined further telephone follow up calls for [name] services. RN has informed son [name]) that pt's primary care provider will be notified and an outreach letter will be sent if pt would like to consider services with [facility] in the future. Contacts were provided and case will be closed.   John Mina, RN Care Management Coordinator [place] Office [phone]

## 2019-01-15 ENCOUNTER — Other Ambulatory Visit: Payer: Self-pay | Admitting: *Deleted

## 2019-01-15 NOTE — Patient Outreach (Signed)
[facility]) Care Management  01/15/2019  [name] [birth year] [id number]    HTA Member  Pt has been assessed with no needs and declined [facility] services however based upon benefits will enroll pt into the HRA and follow up in 6 months per protocol.  [name], RN Care Management Coordinator [place] Office [phone]

## 2019-02-04 ENCOUNTER — Other Ambulatory Visit: Payer: Self-pay | Admitting: Pharmacist

## 2019-02-04 NOTE — Patient Outreach (Signed)
[facility]) Care Management  [place]   02/04/2019  [name] 03-19-1946 [id number]  Reason for referral: Medication Review, Medication Management  Referral source: [employer] Health Risk Assessment Form  Current insurance: [employer]  PMHx includes but not limited to:  CKD stage III, CAD, GERD, history of GOUT, hyperlipidemia, hyponatremia, OSA, and type 2 diabetes.  Outreach:  Successful telephone call with patient and son.  HIPAA identifiers verified.   Subjective:  Patient reports feeling fine but having an issue affording his inhalers.  Patient reports using his son as an adherence strategy. Patient reported he cannot read the medication labels.  Objective: The ASCVD Risk score [name], et al., 2013) failed to calculate for the following reasons:   Cannot find a previous HDL lab   Cannot find a previous total cholesterol lab  Lab Results  Component Value Date   CREATININE 1.14 01/07/2019   CREATININE 1.31 (H) 12/25/2018   CREATININE 1.13 12/24/2018    Lab Results  Component Value Date   HGBA1C 6.2 04/20/2018    Lipid Panel     Component Value Date/Time   CHOL 92 (L) 06/09/2015 1609   TRIG 119 06/09/2015 1609   HDL 28 (L) 06/09/2015 1609   CHOLHDL 3.3 06/09/2015 1609   VLDL 24 06/09/2015 1609   LDLCALC 40 06/09/2015 1609    BP Readings from Last 3 Encounters:  01/07/19 138/70  12/25/18 138/61  09/12/18 138/70    No Known Allergies  Medications Reviewed Today    Reviewed by Elayne Guerin, [place] (Pharmacist) on 02/04/19 at 1235  Med List Status: <None>  Medication Order Taking? Sig Documenting Provider Last Dose Status Informant  acetaminophen (TYLENOL) 325 MG tablet [id number] Yes Take 2 tablets (650 mg total) by mouth every 6 (six) hours as needed. Varney Biles, MD Taking Active Self  Celedonio Miyamoto 62.5-25 MCG/INH AEPB [id number] Yes INHALE 1 PUFF INTO THE LUNGS DAILY [name], MD  Taking Active Self           Med Note Corinna Lines Feb 04, 2019 12:34 PM)    ferrous sulfate 325 (65 FE) MG EC tablet [id number] Yes Take 1 tablet (325 mg total) by mouth daily with breakfast. [name], MD Taking Active Self  furosemide (LASIX) 80 MG tablet [id number] Yes Take 1 tablet (80 mg total) by mouth 2 (two) times daily. [name], MD Taking Active Self  gabapentin (NEURONTIN) 100 MG capsule [id number] Yes TAKE 1 CAPSULE(100 MG) BY MOUTH TWICE DAILY [name], MD Taking Active Self  metFORMIN (GLUCOPHAGE) 850 MG tablet [id number] Yes Take 1 tablet (850 mg total) by mouth 2 (two) times daily with a meal. [name], MD Taking Active Self  nitroGLYCERIN (NITROSTAT) 0.4 MG SL tablet [id number]  Place 1 tablet (0.4 mg total) under the tongue every 5 (five) minutes as needed for chest pain. Nahser, Wonda Cheng, MD  Expired 05/25/18 2359 Family Member  polyethylene glycol (MIRALAX / GLYCOLAX) packet [id number] No Take 17 g by mouth 2 (two) times daily.  Patient not taking:  Reported on 02/04/2019   Roxan Hockey, MD Not Taking Active Self  spironolactone (ALDACTONE) 50 MG tablet [id number] Yes Take 1 tablet (50 mg total) by mouth daily. [name], MD Taking Active Self          Assessment: Drugs sorted by system:  Neurologic/Psychologic: Gabapentin,   Cardiovascular:  Furosemide, Nitroglycerin, Spironolactone  Pulmonary/Allergy: Ventolin HFA, Anoro (not using due to cost),   Gastrointestinal: Polyethylene Glycol,   Endocrine: Metformin,    Pain Acetaminophen,   Vitamins/Minerals/Supplements: Ferrous Sulfate,    Medication Review Findings:  . Adherence-Anoro--patient reported not using Anoro due to cost.   Medication Assistance Findings:  Medication assistance needs identified: Anoro, Albuterol HFA  Extra Help:  Not eligible for Extra Help Low Income Subsidy based on reported income and assets   [facility]  REPORTED THE PATIENT HAS FULL EXTRA HELP  Patient Assistance Programs: Anoro made by [place] requirement met: Yes o Out-of-pocket prescription expenditure met:   No ($196 or $600 needed) - Patient has not met application requirements to apply for this patient assistance program at this time.   Gregary Signs at [facility] reported the patient has full Extra Help.  Patient's with full extra help are not eligible for patient assistance programs.  Called the patient's pharmacy and was told Anoro had not been filled since 2019 and that although the prescription had three refills, they were expired.  Pharmacy said they would reach out to the prescriber for refills.  Called patient back and informed his son.  Plan: . Will follow-up in 3-4 business days to see if the refill of Anoro was run and to make sure the patient picks it up.Elayne Guerin, PharmD, [place] Clinical Pharmacist [phone]

## 2019-02-05 ENCOUNTER — Other Ambulatory Visit: Payer: Self-pay

## 2019-02-05 MED ORDER — UMECLIDINIUM-VILANTEROL 62.5-25 MCG/INH IN AEPB: 1.0000 | INHALATION_SPRAY | Freq: Every day | RESPIRATORY_TRACT | 11 refills | Status: DC

## 2019-02-08 ENCOUNTER — Other Ambulatory Visit: Payer: Self-pay | Admitting: Pharmacist

## 2019-02-08 ENCOUNTER — Ambulatory Visit: Payer: Self-pay | Admitting: Pharmacist

## 2019-02-08 NOTE — Patient Outreach (Signed)
[facility]) Care Management  02/08/2019  ROSEMARY PENTECOST 01-Jun-1946 [id number]   Patient was called to follow up on refills on Anoro. Unfortunately, he did not answer the phone. HIPAA compliant message was left on the the patient's voicemail. Confirmed with [facility], Anoro was filled on 02/05/2019 and the copay was $8.95. Patient has full LIS/Extra Help.   Plan: Follow up in 6-8 weeks. Close patient's case.  Elayne Guerin, PharmD, [place] Clinical Pharmacist [phone]

## 2019-02-13 ENCOUNTER — Other Ambulatory Visit: Payer: Self-pay | Admitting: *Deleted

## 2019-03-11 ENCOUNTER — Other Ambulatory Visit: Payer: Self-pay

## 2019-03-11 ENCOUNTER — Emergency Department (HOSPITAL_COMMUNITY)
Admission: EM | Admit: 2019-03-11 | Discharge: 2019-03-11 | Disposition: A | Discharge status: Home / Self Care | Payer: PPO | Attending: Emergency Medicine | Admitting: Emergency Medicine

## 2019-03-11 DIAGNOSIS — E1122 Type 2 diabetes mellitus with diabetic chronic kidney disease: Secondary | ICD-10-CM | POA: Diagnosis not present

## 2019-03-11 DIAGNOSIS — R21 Rash and other nonspecific skin eruption: Secondary | ICD-10-CM

## 2019-03-11 DIAGNOSIS — I5033 Acute on chronic diastolic (congestive) heart failure: Secondary | ICD-10-CM | POA: Insufficient documentation

## 2019-03-11 DIAGNOSIS — Z951 Presence of aortocoronary bypass graft: Secondary | ICD-10-CM | POA: Diagnosis not present

## 2019-03-11 DIAGNOSIS — D509 Iron deficiency anemia, unspecified: Secondary | ICD-10-CM | POA: Diagnosis not present

## 2019-03-11 DIAGNOSIS — E785 Hyperlipidemia, unspecified: Secondary | ICD-10-CM | POA: Diagnosis not present

## 2019-03-11 DIAGNOSIS — Z79899 Other long term (current) drug therapy: Secondary | ICD-10-CM | POA: Diagnosis not present

## 2019-03-11 DIAGNOSIS — I130 Hypertensive heart and chronic kidney disease with heart failure and stage 1 through stage 4 chronic kidney disease, or unspecified chronic kidney disease: Secondary | ICD-10-CM | POA: Insufficient documentation

## 2019-03-11 DIAGNOSIS — N183 Chronic kidney disease, stage 3 (moderate): Secondary | ICD-10-CM | POA: Insufficient documentation

## 2019-03-11 DIAGNOSIS — I2510 Atherosclerotic heart disease of native coronary artery without angina pectoris: Secondary | ICD-10-CM | POA: Diagnosis not present

## 2019-03-11 DIAGNOSIS — Z7984 Long term (current) use of oral hypoglycemic drugs: Secondary | ICD-10-CM | POA: Diagnosis not present

## 2019-03-11 DIAGNOSIS — Z87891 Personal history of nicotine dependence: Secondary | ICD-10-CM | POA: Insufficient documentation

## 2019-03-11 MED ORDER — FAMOTIDINE 20 MG PO TABS: 20.0000 mg | ORAL_TABLET | Freq: Two times a day (BID) | ORAL | 0 refills | Status: DC | PRN

## 2019-03-11 MED ORDER — CETIRIZINE HCL 10 MG PO TABS: 10.0000 mg | ORAL_TABLET | Freq: Two times a day (BID) | ORAL | 0 refills | Status: DC | PRN

## 2019-03-11 MED ORDER — PREDNISONE 50 MG PO TABS: 50.0000 mg | ORAL_TABLET | Freq: Every day | ORAL | 0 refills | Status: AC

## 2019-03-11 NOTE — ED Provider Notes (Signed)
[facility] EMERGENCY DEPARTMENT Provider Note   CSN: [id number] Arrival date & time: 03/11/19  1442    History   Chief Complaint Chief Complaint  Patient presents with  . Rash    HPI [name] is a 73 y.o. male with history of asthma, CHF, chronic low back pain, CAD, hyperlipidemia, diabetes mellitus presents for evaluation of acute onset, persistent rash for 3 days.  Rash is pruritic, localized to the extremities, trunk, neck.  He reports symptoms began after using a new soap a few days ago and a new detergent that his son bought recently.  Denies any other new exposures including medications, insect bites, time spent in a heavily wooded area, or new animals.  No new foods.  He has been applying a topical cream of some sort without relief.  Denies facial swelling, difficulty breathing or swallowing, nausea or vomiting.  No chest pain.  He has shortness of breath which he reports is at baseline for him due to his chronic medical conditions.     The history is provided by the patient.    Past Medical History:  Diagnosis Date  . Arthritis    "joints tighten up sometimes" (03/27/2104)  . Asthma   . CHF (congestive heart failure) ([place])   . Chronic low back pain   . Chronic lower back pain   . Coronary artery disease    a. 05/2012 Cath/PCI: LM 40ost, 50-60d, LAD 99p ruptured plaque (3.0x28 DES), LCX 50p/m, RCA 30-40p, 50m, EF 65-70%.  . Family history of adverse reaction to anesthesia    Pt granddaughter had PONV  . GERD (gastroesophageal reflux disease)   . Hepatic cirrhosis ([place])    a. Dx 01/2014 - CT a/p   . History of blood transfusion    "related to bleeding ulcers"  . History of concussion    1976--  NO RESIDUAL  . History of GI bleed    a. UGIB 07/2012;  b. 01/2014 admission with GIB/FOB stool req 1U prbc's->EGD showed portal gastropathy, barrett's esoph, and chronic active h. pylori gastritis.  Marland Kitchen History of gout    2007 &  2008  LEFT LEG-- NO ISSUE  SINCE  . Hyperlipidemia   . Iron deficiency anemia   . Kidney stones   . Lipoma   . OA (osteoarthritis of spine)    LOWER BACK--  INTERMITTANT LEFT LEG NUMBNESS  . OSA (obstructive sleep apnea)    PULMOLOGIST-  DR CLANCE--  MODERATE OSA  STARTED CPAP 2012--  BUT CURRENTLY HAS NOT USED PAST 6 MONTHS  . Phimosis    a. s/p circumcision 2015.  . Type 2 diabetes mellitus ([place])   . Unspecified essential hypertension     Patient Active Problem List   Diagnosis Date Noted  . Hyponatremia 12/24/2018  . Acute on chronic diastolic CHF (congestive heart failure) ([place]) 12/24/2018  . Precordial chest pain   . Neck pain 09/12/2018  . CKD (chronic kidney disease), stage III ([place]) 12/18/2017  . Symptomatic anemia 12/18/2017  . Peptic ulcer disease 12/18/2017  . GI bleed 09/29/2017  . SOB (shortness of breath) 09/21/2017  . Morbid obesity ([place]) 02/05/2016  . Numerous moles 09/15/2015  . S/P CABG x 3 03/31/2014  . Hepatic cirrhosis ([place]) 02/27/2014  . Acute blood loss anemia 08/13/2012  . Hyperlipidemia with target LDL less than 70 06/29/2012  . Thrombocytopenia ([place]) 06/29/2012  . Coronary artery disease    . OSA (obstructive sleep apnea)   . Type 2  diabetes with complication ([place]) 09/62/8366  . GERD 08/04/2010  . History of gout   . Hypertensive heart disease     Past Surgical History:  Procedure Laterality Date  . ANKLE FRACTURE SURGERY Right 1989   "plate put in"  . APPENDECTOMY  05-16-2004   open  . CIRCUMCISION N/A 09/09/2013   Procedure: CIRCUMCISION ADULT;  Surgeon: Bernestine Amass, MD;  Location: [facility];  Service: Urology;  Laterality: N/A;  . COLECTOMY  05-16-2004  . CORONARY ANGIOPLASTY WITH STENT PLACEMENT  06/28/2012  DR COOPER   PCI W/  X1 DES to [place]. LAD/  LM  40% OSTIAL & 50-60% DISTAL /  50% PROX LCX/  30-40% PROX RCA & 50% MID RCA/   LVEF 65-70%  . CORONARY ARTERY BYPASS GRAFT N/A 03/31/2014   Procedure: CORONARY ARTERY BYPASS GRAFTING (CABG) times  3 using left internal mammary artery and right saphenous vein.;  Surgeon: Melrose Nakayama, MD;  Location: [facility];  Service: Open Heart Surgery;  Laterality: N/A;  . DOBUTAMINE STRESS ECHO  06-08-2012   MODERATE HYPOKINESIS/ ISCHEMIA MID INFERIOR WALL  . ESOPHAGOGASTRODUODENOSCOPY  08/15/2012   Procedure: ESOPHAGOGASTRODUODENOSCOPY (EGD);  Surgeon: Wonda Horner, MD;  Location: [facility] ENDOSCOPY;  Service: Endoscopy;  Laterality: N/A;  . ESOPHAGOGASTRODUODENOSCOPY N/A 02/17/2014   Procedure: ESOPHAGOGASTRODUODENOSCOPY (EGD);  Surgeon: Jeryl Columbia, MD;  Location: [facility] ENDOSCOPY;  Service: Endoscopy;  Laterality: N/A;  . ESOPHAGOGASTRODUODENOSCOPY (EGD) WITH PROPOFOL N/A 09/30/2017   Procedure: ESOPHAGOGASTRODUODENOSCOPY (EGD) WITH PROPOFOL;  Surgeon: Wonda Horner, MD;  Location: [facility] ENDOSCOPY;  Service: Endoscopy;  Laterality: N/A;  . ESOPHAGOGASTRODUODENOSCOPY (EGD) WITH PROPOFOL N/A 12/20/2017   Procedure: ESOPHAGOGASTRODUODENOSCOPY (EGD) WITH PROPOFOL;  Surgeon: Clarene Essex, MD;  Location: [facility];  Service: Endoscopy;  Laterality: N/A;  . HERNIA REPAIR    . INTRAOPERATIVE TRANSESOPHAGEAL ECHOCARDIOGRAM N/A 03/31/2014   Procedure: INTRAOPERATIVE TRANSESOPHAGEAL ECHOCARDIOGRAM;  Surgeon: Melrose Nakayama, MD;  Location: [facility];  Service: Open Heart Surgery;  Laterality: N/A;  . LAPAROSCOPIC UMBILICAL HERNIA REPAIR W/ MESH  06-06-2011  . LEFT HEART CATHETERIZATION WITH CORONARY ANGIOGRAM N/A 03/28/2014   Procedure: LEFT HEART CATHETERIZATION WITH CORONARY ANGIOGRAM;  Surgeon: Sinclair Grooms, MD;  Location: [facility] CATH LAB;  Service: Cardiovascular;  Laterality: N/A;  . LIPOMA EXCISION Left 08/25/2017   Procedure: EXCISION LIPOMA LEFT POSTERIOR THIGH;  Surgeon: Clovis Riley, MD;  Location: [facility];  Service: General;  Laterality: Left;  . NEPHROLITHOTOMY  1990'S  . OPEN APPENDECTOMY W/ PARTIAL CECECTOMY  05-16-2004  . PERCUTANEOUS CORONARY STENT INTERVENTION (PCI-S) N/A 06/28/2012   Procedure:  PERCUTANEOUS CORONARY STENT INTERVENTION (PCI-S);  Surgeon: Sherren Mocha, MD;  Location: [facility] CATH LAB;  Service: Cardiovascular;  Laterality: N/A;        Home Medications    Prior to Admission medications   Medication Sig Start Date End Date Taking? Authorizing Provider  acetaminophen (TYLENOL) 325 MG tablet Take 2 tablets (650 mg total) by mouth every 6 (six) hours as needed. Patient not taking: Reported on 02/04/2019 05/25/18   Varney Biles, MD  albuterol (VENTOLIN HFA) 108 (90 Base) MCG/ACT inhaler Inhale 2 puffs into the lungs every 6 (six) hours as needed for wheezing or shortness of breath.    [provider]  cetirizine (ZYRTEC ALLERGY) 10 MG tablet Take 1 tablet (10 mg total) by mouth 2 (two) times daily as needed (itching). 03/11/19   Joel Cowin A, PA-C  famotidine (PEPCID) 20 MG tablet Take 1 tablet (20 mg total)  by mouth 2 (two) times daily as needed (itching). 03/11/19   Nils Flack, Vesta Wheeland A, PA-C  ferrous sulfate 325 (65 FE) MG EC tablet Take 1 tablet (325 mg total) by mouth daily with breakfast. 04/20/18   [name], MD  furosemide (LASIX) 80 MG tablet Take 1 tablet (80 mg total) by mouth 2 (two) times daily. 04/20/18   [name], MD  gabapentin (NEURONTIN) 100 MG capsule TAKE 1 CAPSULE(100 MG) BY MOUTH TWICE DAILY 04/20/18   [name], MD  metFORMIN (GLUCOPHAGE) 850 MG tablet Take 1 tablet (850 mg total) by mouth 2 (two) times daily with a meal. 04/20/18   [name], MD  nitroGLYCERIN (NITROSTAT) 0.4 MG SL tablet Place 1 tablet (0.4 mg total) under the tongue every 5 (five) minutes as needed for chest pain. 06/08/16 05/25/18  Nahser, Wonda Cheng, MD  polyethylene glycol [facility] / Floria Raveling) packet Take 17 g by mouth 2 (two) times daily. Patient not taking: Reported on 02/04/2019 10/05/17   Roxan Hockey, MD  predniSONE (DELTASONE) 50 MG tablet Take 1 tablet (50 mg total) by mouth daily with breakfast for 5 days. 03/11/19 03/16/19  Rodell Perna A, PA-C  spironolactone (ALDACTONE) 50 MG tablet Take 1 tablet (50 mg total) by mouth daily. 04/20/18   [name], MD  umeclidinium-vilanterol (ANORO ELLIPTA) 62.5-25 MCG/INH AEPB Inhale 1 puff into the lungs daily. 02/05/19   [name], MD    Family History Family History  Problem Relation Age of Onset  . Lung cancer Sister   . Cancer Sister        lung  . Cancer Mother   . Cancer Father        died in his 105s.  . Cancer Brother        lung  . Coronary artery disease Other   . Diabetes Other   . Colon cancer Other   . Cancer Sister        lung    Social History Social History   Tobacco Use  . Smoking status: Former Smoker    Packs/day: 2.00    Years: 40.00    Pack years: 80.00    Types: Cigarettes    Quit date: 08/29/1996    Years since quitting: 22.5  . Smokeless tobacco: Never Used  Substance Use Topics  . Alcohol use: Yes    Alcohol/week: 0.0 standard drinks    Comment: occasional  . Drug use: No     Allergies   Patient has no known allergies.   Review of Systems Review of Systems  Constitutional: Negative for chills and fever.  Respiratory: Negative for cough and shortness of breath (chronic, unchanged).   Cardiovascular: Negative for chest pain and leg swelling (chronic, unchanged).  Skin: Positive for rash.  All other systems reviewed and are negative.    Physical Exam Updated Vital Signs BP (!) 157/56   Pulse 73   Temp 97.9 F (36.6 C) (Oral)   Resp 18   SpO2 99%   Physical Exam Vitals signs and nursing note reviewed.  Constitutional:      General: He is not in acute distress.    Appearance: He is well-developed.  HENT:     Head: Normocephalic and atraumatic.     Comments: No perioral swelling, tolerating secretions without difficulty.  No facial swelling. Eyes:     General:        Right eye: No discharge.        Left  eye: No discharge.     Conjunctiva/sclera: Conjunctivae normal.  Neck:      Musculoskeletal: Normal range of motion and neck supple.     Vascular: No JVD.     Trachea: No tracheal deviation.     Comments: No upper airway stridor Cardiovascular:     Rate and Rhythm: Normal rate and regular rhythm.     Comments: 2+ pitting edema of the bilateral lower extremities.  Patient reports this is chronic and unchanged. Pulmonary:     Effort: Pulmonary effort is normal.     Comments: Soft expiratory wheezes.  Patient speaking in full sentences without difficulty, SPO2 saturations 99% on room air. Abdominal:     General: There is no distension.     Palpations: Abdomen is soft.     Tenderness: There is no abdominal tenderness. There is no right CVA tenderness or rebound.  Skin:    General: Skin is warm and dry.     Findings: Rash present. No erythema.     Comments: Erythematous maculopapular rash convalescing into plaques localized to the trunk, upper extremities, and neck.  Overlying excoriations noted.  No vesicles, no desquamation, Nikolsky sign absent.  Neurological:     Mental Status: He is alert.  Psychiatric:        Behavior: Behavior normal.      ED Treatments / Results  Labs (all labs ordered are listed, but only abnormal results are displayed) Labs Reviewed - No data to display  EKG None  Radiology No results found.  Procedures Procedures (including critical care time)  Medications Ordered in ED Medications - No data to display   Initial Impression / Assessment and Plan / ED Course  I have reviewed the triage vital signs and the nursing notes.  Pertinent labs & imaging results that were available during my care of the patient were reviewed by me and considered in my medical decision making (see chart for details).        Rash consistent with contact dermatitis secondary to new soaps/detergents. Patient denies any difficulty breathing or swallowing.  Patient is afebrile, vital signs are stable.  He is nontoxic in appearance.  Pt has a patent  airway without stridor and is handling secretions without difficulty; no angioedema. No blisters, no pustules, no warmth, no draining sinus tracts, no superficial abscesses, no bullous impetigo, no vesicles, no desquamation, no target lesions with dusky purpura or a central bulla. Not tender to touch. No concern for superimposed infection. No concern for SJS, TEN, TSS, tick borne illness, syphilis or other life-threatening condition.  Will discharge with short course of prednisone but advised that this may cause his blood sugars to increase and encouraged him to check his blood sugars carefully at home.  Also advised that if his sugars should increase he should call his PCP and discontinue the medication.  Will give Zyrtec and Pepcid for itching as Benadryl not recommended in his age group. Recommend follow-up with PCP for reevaluation of symptoms.  Discussed strict ED return precautions.  Patient verbalized understanding of and agreement with plan and patient stable for discharge home at this time.    Final Clinical Impressions(s) / ED Diagnoses   Final diagnoses:  Rash    ED Discharge Orders         Ordered    predniSONE (DELTASONE) 50 MG tablet  Daily with breakfast     03/11/19 1758    famotidine (PEPCID) 20 MG tablet  2 times daily PRN  03/11/19 1758    cetirizine (ZYRTEC ALLERGY) 10 MG tablet  2 times daily PRN     03/11/19 1758           Renita Papa, PA-C 03/11/19 1800    Valarie Merino, MD 03/11/19 1843

## 2019-03-11 NOTE — Discharge Instructions (Signed)
Take prednisone as prescribed.  This medication can cause your blood sugars to increase so please check them daily at least once a day.  If your blood sugars increase while on the prednisone, stop taking it and call your primary care doctor immediately for recommendations.  You can take Zyrtec twice daily for your itching.  You can also take Pepcid twice daily in addition to this if your itching persist.  You can also apply calamine lotion or take oatmeal baths.  Stop using the detergent and soaps that could have caused your rash.  Call your primary care doctor for further recommendations related to your leg swelling.  They may want you to increase your fluid pill dose.  Return to the emergency department immediately for any concerning signs or symptoms develop such as swelling of the lips or tongue, difficulty breathing or swallowing, shortness of breath or chest pains.

## 2019-03-11 NOTE — ED Triage Notes (Signed)
Pt reports rash x 2-3 days. Pt reports itching. Denies any allergies.

## 2019-03-22 ENCOUNTER — Encounter: Payer: Self-pay | Admitting: Internal Medicine

## 2019-03-22 ENCOUNTER — Other Ambulatory Visit: Payer: Self-pay

## 2019-03-22 ENCOUNTER — Ambulatory Visit (INDEPENDENT_AMBULATORY_CARE_PROVIDER_SITE_OTHER): Payer: PPO | Admitting: Internal Medicine

## 2019-03-22 VITALS — BP 142/84 | HR 78 | Temp 98.4°F | Ht 64.0 in | Wt 254.0 lb

## 2019-03-22 DIAGNOSIS — L299 Pruritus, unspecified: Secondary | ICD-10-CM | POA: Diagnosis not present

## 2019-03-22 DIAGNOSIS — L989 Disorder of the skin and subcutaneous tissue, unspecified: Secondary | ICD-10-CM | POA: Diagnosis not present

## 2019-03-22 DIAGNOSIS — R21 Rash and other nonspecific skin eruption: Secondary | ICD-10-CM | POA: Diagnosis not present

## 2019-03-22 DIAGNOSIS — E118 Type 2 diabetes mellitus with unspecified complications: Secondary | ICD-10-CM | POA: Diagnosis not present

## 2019-03-22 MED ORDER — METHYLPREDNISOLONE ACETATE 80 MG/ML IJ SUSP
80.0000 mg | Freq: Once | INTRAMUSCULAR | Status: AC
  Administered 2019-03-22: 80 mg via INTRAMUSCULAR

## 2019-03-22 MED ORDER — HYDROXYZINE HCL 10 MG PO TABS: 10.0000 mg | ORAL_TABLET | Freq: Four times a day (QID) | ORAL | 0 refills | Status: DC | PRN

## 2019-03-22 MED ORDER — PREDNISONE 10 MG PO TABS: ORAL_TABLET | ORAL | 0 refills | Status: DC

## 2019-03-22 NOTE — Patient Instructions (Signed)
You had the steroid shot today  Please take all new medication as prescribed - the prednisone, and the hydroxyzine for itching and rash  You will be contacted regarding the referral for: Dermatology  Please continue all other medications as before, and refills have been done if requested.  Please have the pharmacy call with any other refills you may need.  Please keep your appointments with your specialists as you may have planned

## 2019-03-22 NOTE — Progress Notes (Signed)
Subjective:    Patient ID: [name], male    DOB: [birth year], 73 y.o.   MRN: [id number]  HPI  Here with son, with c/o persistent marked itchy red rash mostly located to the torso and arms, just cant stop scratching, constant, mod, nothing seems to make better or worse.  Was seen recently July 13 at ED.  Pt still denies new foods or meds other than those prescribed at ED.  He stopped the new soap and did take the prednisone 50 qd x 5 days but little to no changes, except neck rash part is improved.  No other new angioedema like swelling, and Pt denies chest pain, increased sob or doe, wheezing, orthopnea, PND, increased LE swelling, palpitations, dizziness or syncope.  Also noted is Mother died of melanoma and he has a non healing rash with recurrent scabbing he removes and bleeding over a few minutes each time, all over the last several months.  Also has a black lesion to LLQ that may be larger and raised.  Pt denies polydipsia, polyuria  Past Medical History:  Diagnosis Date  . Arthritis    "joints tighten up sometimes" (03/27/2104)  . Asthma   . CHF (congestive heart failure) ([place])   . Chronic low back pain   . Chronic lower back pain   . Coronary artery disease    a. 05/2012 Cath/PCI: LM 40ost, 50-60d, LAD 99p ruptured plaque (3.0x28 DES), LCX 50p/m, RCA 30-40p, 34m, EF 65-70%.  . Family history of adverse reaction to anesthesia    Pt granddaughter had PONV  . GERD (gastroesophageal reflux disease)   . Hepatic cirrhosis ([place])    a. Dx 01/2014 - CT a/p   . History of blood transfusion    "related to bleeding ulcers"  . History of concussion    1976--  NO RESIDUAL  . History of GI bleed    a. UGIB 07/2012;  b. 01/2014 admission with GIB/FOB stool req 1U prbc's->EGD showed portal gastropathy, barrett's esoph, and chronic active h. pylori gastritis.  Marland Kitchen History of gout    2007 &  2008  LEFT LEG-- NO ISSUE SINCE  . Hyperlipidemia   . Iron deficiency anemia   . Kidney stones   .  Lipoma   . OA (osteoarthritis of spine)    LOWER BACK--  INTERMITTANT LEFT LEG NUMBNESS  . OSA (obstructive sleep apnea)    PULMOLOGIST-  DR CLANCE--  MODERATE OSA  STARTED CPAP 2012--  BUT CURRENTLY HAS NOT USED PAST 6 MONTHS  . Phimosis    a. s/p circumcision 2015.  . Type 2 diabetes mellitus ([place])   . Unspecified essential hypertension    Past Surgical History:  Procedure Laterality Date  . ANKLE FRACTURE SURGERY Right 1989   "plate put in"  . APPENDECTOMY  05-16-2004   open  . CIRCUMCISION N/A 09/09/2013   Procedure: CIRCUMCISION ADULT;  Surgeon: Bernestine Amass, MD;  Location: [facility];  Service: Urology;  Laterality: N/A;  . COLECTOMY  05-16-2004  . CORONARY ANGIOPLASTY WITH STENT PLACEMENT  06/28/2012  DR COOPER   PCI W/  X1 DES to [place]. LAD/  LM  40% OSTIAL & 50-60% DISTAL /  50% PROX LCX/  30-40% PROX RCA & 50% MID RCA/   LVEF 65-70%  . CORONARY ARTERY BYPASS GRAFT N/A 03/31/2014   Procedure: CORONARY ARTERY BYPASS GRAFTING (CABG) times 3 using left internal mammary artery and right saphenous vein.;  Surgeon: Melrose Nakayama, MD;  Location: [facility] OR;  Service: Open Heart Surgery;  Laterality: N/A;  . DOBUTAMINE STRESS ECHO  06-08-2012   MODERATE HYPOKINESIS/ ISCHEMIA MID INFERIOR WALL  . ESOPHAGOGASTRODUODENOSCOPY  08/15/2012   Procedure: ESOPHAGOGASTRODUODENOSCOPY (EGD);  Surgeon: Wonda Horner, MD;  Location[facility] ENDOSCOPY;  Service: Endoscopy;  Laterality: N/A;  . ESOPHAGOGASTRODUODENOSCOPY N/A 02/17/2014   Procedure: ESOPHAGOGASTRODUODENOSCOPY (EGD);  Surgeon: Jeryl Columbia, MD;  Location: [facility] ENDOSCOPY;  Service: Endoscopy;  Laterality: N/A;  . ESOPHAGOGASTRODUODENOSCOPY (EGD) WITH PROPOFOL N/A 09/30/2017   Procedure: ESOPHAGOGASTRODUODENOSCOPY (EGD) WITH PROPOFOL;  Surgeon: Wonda Horner, MD;  Location: [facility] ENDOSCOPY;  Service: Endoscopy;  Laterality: N/A;  . ESOPHAGOGASTRODUODENOSCOPY (EGD) WITH PROPOFOL N/A 12/20/2017   Procedure: ESOPHAGOGASTRODUODENOSCOPY  (EGD) WITH PROPOFOL;  Surgeon: Clarene Essex, MD;  Location: [place];  Service: Endoscopy;  Laterality: N/A;  . HERNIA REPAIR    . INTRAOPERATIVE TRANSESOPHAGEAL ECHOCARDIOGRAM N/A 03/31/2014   Procedure: INTRAOPERATIVE TRANSESOPHAGEAL ECHOCARDIOGRAM;  Surgeon: Melrose Nakayama, MD;  Location: [place];  Service: Open Heart Surgery;  Laterality: N/A;  . LAPAROSCOPIC UMBILICAL HERNIA REPAIR W/ MESH  06-06-2011  . LEFT HEART CATHETERIZATION WITH CORONARY ANGIOGRAM N/A 03/28/2014   Procedure: LEFT HEART CATHETERIZATION WITH CORONARY ANGIOGRAM;  Surgeon: Sinclair Grooms, MD;  Location: [facility] CATH LAB;  Service: Cardiovascular;  Laterality: N/A;  . LIPOMA EXCISION Left 08/25/2017   Procedure: EXCISION LIPOMA LEFT POSTERIOR THIGH;  Surgeon: Clovis Riley, MD;  Location: [place];  Service: General;  Laterality: Left;  . NEPHROLITHOTOMY  1990'S  . OPEN APPENDECTOMY W/ PARTIAL CECECTOMY  05-16-2004  . PERCUTANEOUS CORONARY STENT INTERVENTION (PCI-S) N/A 06/28/2012   Procedure: PERCUTANEOUS CORONARY STENT INTERVENTION (PCI-S);  Surgeon: Sherren Mocha, MD;  Location: [facility] CATH LAB;  Service: Cardiovascular;  Laterality: N/A;    reports that he quit smoking about 22 years ago. His smoking use included cigarettes. He has a 80.00 pack-year smoking history. He has never used smokeless tobacco. He reports current alcohol use. He reports that he does not use drugs. family history includes Cancer in his brother, father, mother, sister, and sister; Colon cancer in an other family member; Coronary artery disease in an other family member; Diabetes in an other family member; Lung cancer in his sister. No Known Allergies Current Outpatient Medications on File Prior to Visit  Medication Sig Dispense Refill  . acetaminophen (TYLENOL) 325 MG tablet Take 2 tablets (650 mg total) by mouth every 6 (six) hours as needed. 30 tablet 0  . albuterol (VENTOLIN HFA) 108 (90 Base) MCG/ACT inhaler Inhale 2 puffs into the lungs  every 6 (six) hours as needed for wheezing or shortness of breath.    . cetirizine (ZYRTEC ALLERGY) 10 MG tablet Take 1 tablet (10 mg total) by mouth 2 (two) times daily as needed (itching). 10 tablet 0  . famotidine (PEPCID) 20 MG tablet Take 1 tablet (20 mg total) by mouth 2 (two) times daily as needed (itching). 10 tablet 0  . ferrous sulfate 325 (65 FE) MG EC tablet Take 1 tablet (325 mg total) by mouth daily with breakfast. 90 tablet 3  . furosemide (LASIX) 80 MG tablet Take 1 tablet (80 mg total) by mouth 2 (two) times daily. 180 tablet 3  . gabapentin (NEURONTIN) 100 MG capsule TAKE 1 CAPSULE(100 MG) BY MOUTH TWICE DAILY 180 capsule 3  . metFORMIN (GLUCOPHAGE) 850 MG tablet Take 1 tablet (850 mg total) by mouth 2 (two) times daily with a meal. 180 tablet 3  . polyethylene glycol (MIRALAX / GLYCOLAX)  packet Take 17 g by mouth 2 (two) times daily. 14 each 0  . spironolactone (ALDACTONE) 50 MG tablet Take 1 tablet (50 mg total) by mouth daily. 90 tablet 3  . umeclidinium-vilanterol (ANORO ELLIPTA) 62.5-25 MCG/INH AEPB Inhale 1 puff into the lungs daily. 30 each 11  . nitroGLYCERIN (NITROSTAT) 0.4 MG SL tablet Place 1 tablet (0.4 mg total) under the tongue every 5 (five) minutes as needed for chest pain. 25 tablet 6   No current facility-administered medications on file prior to visit.    Review of Systems  Constitutional: Negative for other unusual diaphoresis or sweats HENT: Negative for ear discharge or swelling Eyes: Negative for other worsening visual disturbances Respiratory: Negative for stridor or other swelling  Gastrointestinal: Negative for worsening distension or other blood Genitourinary: Negative for retention or other urinary change Musculoskeletal: Negative for other MSK pain or swelling Skin: Negative for color change or other new lesions Neurological: Negative for worsening tremors and other numbness  Psychiatric/Behavioral: Negative for worsening agitation or other  fatigue All other system neg per pt    Objective:   Physical Exam BP (!) 142/84   Pulse 78   Temp 98.4 F (36.9 C) (Oral)   Ht 5\' 4"  (1.626 m)   Wt 254 lb (115.2 kg)   SpO2 95%   BMI 43.60 kg/m  VS noted,  Constitutional: Pt appears in NAD HENT: Head: NCAT.  Right Ear: External ear normal.  Left Ear: External ear normal.  Eyes: . Pupils are equal, round, and reactive to light. Conjunctivae and EOM are normal Nose: without d/c or deformity Neck: Neck supple. Gross normal ROM Cardiovascular: Normal rate and regular rhythm.   Pulmonary/Chest: Effort normal and breath sounds without rales or wheezing.  Abd:  Soft, NT, ND, + BS, no organomegaly Neurological: Pt is alert. At baseline orientation, motor grossly intact Skin: Skin is warm. No rashes, other new lesions, no LE edema Psychiatric: Pt behavior is normal without agitation  No other exam findings Lab Results  Component Value Date   WBC 4.9 01/07/2019   HGB 8.5 Repeated and verified X2. (L) 01/07/2019   HCT 27.8 (L) 01/07/2019   PLT 169.0 01/07/2019   GLUCOSE 170 (H) 01/07/2019   CHOL 92 (L) 06/09/2015   TRIG 119 06/09/2015   HDL 28 (L) 06/09/2015   LDLCALC 40 06/09/2015   ALT 15 01/07/2019   AST 22 01/07/2019   NA 137 01/07/2019   K 4.0 01/07/2019   CL 101 01/07/2019   CREATININE 1.14 01/07/2019   BUN 24 (H) 01/07/2019   CO2 26 01/07/2019   TSH 1.854 12/24/2018   INR 1.07 05/25/2018   HGBA1C 6.2 04/20/2018   MICROALBUR 0.8 03/13/2013      Assessment & Plan:

## 2019-03-23 ENCOUNTER — Encounter: Payer: Self-pay | Admitting: Internal Medicine

## 2019-03-23 NOTE — Assessment & Plan Note (Addendum)
[place] for atarax prn,  Repeat predpac asd, refer dermatology, to f/u any worsening symptoms or concerns, refer derm

## 2019-03-23 NOTE — Assessment & Plan Note (Signed)
Left anterior shoulder with scabbed non healing lesion currently slight bleeding today; also has non bleeding black lesion to LLQ; for derm referral, r/o melanoma

## 2019-03-23 NOTE — Assessment & Plan Note (Signed)
stable overall by history and exam, recent data reviewed with pt, and pt to continue medical treatment as before,  to f/u any worsening symptoms or concerns, declines a1c today

## 2019-04-08 DIAGNOSIS — D225 Melanocytic nevi of trunk: Secondary | ICD-10-CM | POA: Diagnosis not present

## 2019-04-08 DIAGNOSIS — C44519 Basal cell carcinoma of skin of other part of trunk: Secondary | ICD-10-CM | POA: Diagnosis not present

## 2019-04-08 DIAGNOSIS — C44619 Basal cell carcinoma of skin of left upper limb, including shoulder: Secondary | ICD-10-CM | POA: Diagnosis not present

## 2019-04-08 DIAGNOSIS — D1801 Hemangioma of skin and subcutaneous tissue: Secondary | ICD-10-CM | POA: Diagnosis not present

## 2019-04-08 DIAGNOSIS — D485 Neoplasm of uncertain behavior of skin: Secondary | ICD-10-CM | POA: Diagnosis not present

## 2019-04-08 DIAGNOSIS — L821 Other seborrheic keratosis: Secondary | ICD-10-CM | POA: Diagnosis not present

## 2019-04-22 NOTE — Progress Notes (Signed)
Cardiology Office Note:    Date:  04/23/2019   ID:  LYN DEEMER, DOB 1945/10/08, MRN [id number]  PCP:  [name], MD  Cardiologist:  Mertie Moores, MD   Electrophysiologist:  None   Referring MD: [name], *   Chief Complaint  Patient presents with  . Follow-up    CAD, CHF     History of Present Illness:    BOLIVAR KORANDA is a 73 y.o. male with:  Coronary artery disease   S/p DES to LAD in 2013  S/p CABG 05/2014  HFpEF   Diabetes mellitus 2  Hypertension   Hyperlipidemia   Chronic kidney disease   Hx of GI bleed 09/2017  Hepatic cirrhosis  Thrombocytopenia  Heavy ETOH use  OSA  Mr. Veltre was last seen by Dr. Acie Fredrickson in 09/2017.  He was admitted in 11/2018 with decompensated HF in the setting of profound anemia.  He returns for follow-up.  He is here alone.  He has occasional left-sided chest discomfort.  This is not really related to exertion.  He has not had any symptoms reminiscent of his previous angina.  He does note chronic shortness of breath with exertion.  This seems to be overall stable.  He often uses his inhaler with relief.  He really has not had orthopnea or PND.  He has significant lower extremity swelling that seems to be stable.  He notes he gets better if he limits his salt.  He has not had syncope.  He has not had any bleeding issues.  Prior CV studies:   The following studies were reviewed today:  Echocardiogram 10/02/17 Mod LVH, EF 60-65, no RWMA, mild AI, mild MAC, mod BAE  Pre-CABG Dopplers 03/29/14 Summary:  Bilateral: 1-39% ICA stenosis. Vertebral artery flow is antegrade.  ABI is within normal limits with abnormal Doppler waveforms.   Cardiac catheterization 03/28/14 LM prox 30, dist 70-80 LAD ostial 60-70, patent prox stent LCx ostial 90, mid 80-90 RCA prox to mid 62 EF 60  Past Medical History:  Diagnosis Date  . Arthritis    "joints tighten up sometimes" (03/27/2104)  . Asthma   . CHF (congestive heart  failure) ([place])   . Chronic low back pain   . Chronic lower back pain   . Coronary artery disease    a. 05/2012 Cath/PCI: LM 40ost, 50-60d, LAD 99p ruptured plaque (3.0x28 DES), LCX 50p/m, RCA 30-40p, 41m EF 65-70%.  . Family history of adverse reaction to anesthesia    Pt granddaughter had PONV  . GERD (gastroesophageal reflux disease)   . Hepatic cirrhosis ([place]    a. Dx 01/2014 - CT a/p   . History of blood transfusion    "related to bleeding ulcers"  . History of concussion    1976--  NO RESIDUAL  . History of GI bleed    a. UGIB 07/2012;  b. 01/2014 admission with GIB/FOB stool req 1U prbc's->EGD showed portal gastropathy, barrett's esoph, and chronic active h. pylori gastritis.  .Marland KitchenHistory of gout    2007 &  2008  LEFT LEG-- NO ISSUE SINCE  . Hyperlipidemia   . Iron deficiency anemia   . Kidney stones   . Lipoma   . OA (osteoarthritis of spine)    LOWER BACK--  INTERMITTANT LEFT LEG NUMBNESS  . OSA (obstructive sleep apnea)    PULMOLOGIST-  DR CLANCE--  MODERATE OSA  STARTED CPAP 2012--  BUT CURRENTLY HAS NOT USED PAST 6 MONTHS  . Phimosis  a. s/p circumcision 2015.  . Type 2 diabetes mellitus ([place])   . Unspecified essential hypertension    Surgical Hx: The patient  has a past surgical history that includes Esophagogastroduodenoscopy (08/15/2012); Ankle fracture surgery (Right, 1989); LAPAROSCOPIC UMBILICAL HERNIA REPAIR W/ MESH (06-06-2011); OPEN APPENDECTOMY W/ PARTIAL CECECTOMY (05-16-2004); Dobutamine stress echo (06-08-2012); Nephrolithotomy (1990'S); Circumcision (N/A, 09/09/2013); Esophagogastroduodenoscopy (N/A, 02/17/2014); Hernia repair; Appendectomy (05-16-2004); Colectomy (05-16-2004); Coronary angioplasty with stent (06/28/2012  DR COOPER); Coronary artery bypass graft (N/A, 03/31/2014); Intraoprative transesophageal echocardiogram (N/A, 03/31/2014); percutaneous coronary stent intervention (pci-s) (N/A, 06/28/2012); left heart catheterization with coronary angiogram (N/A,  03/28/2014); Lipoma excision (Left, 08/25/2017); Esophagogastroduodenoscopy (egd) with propofol (N/A, 09/30/2017); and Esophagogastroduodenoscopy (egd) with propofol (N/A, 12/20/2017).   Current Medications: Current Meds  Medication Sig  . acetaminophen (TYLENOL) 325 MG tablet Take 2 tablets (650 mg total) by mouth every 6 (six) hours as needed.  Marland Kitchen albuterol (VENTOLIN HFA) 108 (90 Base) MCG/ACT inhaler Inhale 2 puffs into the lungs every 6 (six) hours as needed for wheezing or shortness of breath.  . famotidine (PEPCID) 20 MG tablet Take 1 tablet (20 mg total) by mouth 2 (two) times daily as needed (itching).  . ferrous sulfate 325 (65 FE) MG EC tablet Take 1 tablet (325 mg total) by mouth daily with breakfast.  . gabapentin (NEURONTIN) 100 MG capsule TAKE 1 CAPSULE(100 MG) BY MOUTH TWICE DAILY  . hydrOXYzine (ATARAX/VISTARIL) 10 MG tablet Take 1 tablet (10 mg total) by mouth every 6 (six) hours as needed for itching.  . metFORMIN (GLUCOPHAGE) 850 MG tablet Take 1 tablet (850 mg total) by mouth 2 (two) times daily with a meal.  . nitroGLYCERIN (NITROSTAT) 0.4 MG SL tablet Place 1 tablet (0.4 mg total) under the tongue every 5 (five) minutes as needed for chest pain.  . polyethylene glycol (MIRALAX / GLYCOLAX) packet Take 17 g by mouth 2 (two) times daily.  Marland Kitchen spironolactone (ALDACTONE) 50 MG tablet Take 1 tablet (50 mg total) by mouth daily.  Marland Kitchen umeclidinium-vilanterol (ANORO ELLIPTA) 62.5-25 MCG/INH AEPB Inhale 1 puff into the lungs daily.  . [DISCONTINUED] furosemide (LASIX) 80 MG tablet Take 1 tablet (80 mg total) by mouth 2 (two) times daily.     Allergies:   Patient has no known allergies.   Social History   Tobacco Use  . Smoking status: Former Smoker    Packs/day: 2.00    Years: 40.00    Pack years: 80.00    Types: Cigarettes    Quit date: 08/29/1996    Years since quitting: 22.6  . Smokeless tobacco: Never Used  Substance Use Topics  . Alcohol use: Yes    Alcohol/week: 0.0 standard  drinks    Comment: occasional  . Drug use: No     Family Hx: The patient's family history includes Cancer in his brother, father, mother, sister, and sister; Colon cancer in an other family member; Coronary artery disease in an other family member; Diabetes in an other family member; Lung cancer in his sister.  ROS:   Please see the history of present illness.    ROS All other systems reviewed and are negative.   EKGs/Labs/Other Test Reviewed:    EKG:  EKG is   ordered today.  The ekg ordered today demonstrates normal sinus rhythm, heart rate 68, normal axis, QTC 433, no significant change  Recent Labs: 12/24/2018: B Natriuretic Peptide 244.7; TSH 1.854 01/07/2019: ALT 15; BUN 24; Creatinine, Ser 1.14; Hemoglobin 8.5 Repeated and verified X2.; Magnesium 2.1; Platelets  169.0; Potassium 4.0; Sodium 137   Recent Lipid Panel Lab Results  Component Value Date/Time   CHOL 92 (L) 06/09/2015 04:09 PM   TRIG 119 06/09/2015 04:09 PM   HDL 28 (L) 06/09/2015 04:09 PM   CHOLHDL 3.3 06/09/2015 04:09 PM   LDLCALC 40 06/09/2015 04:09 PM    Physical Exam:    VS:  BP (!) 144/64   Pulse 68   Ht _0  (1.626 m)   Wt 249 lb 6.4 oz (113.1 kg)   BMI 42.81 kg/m     Wt Readings from Last 3 Encounters:  04/23/19 249 lb 6.4 oz (113.1 kg)  03/22/19 254 lb (115.2 kg)  01/07/19 249 lb (112.9 kg)     Physical Exam  Constitutional: He is oriented to person, place, and time. He appears well-developed and well-nourished. No distress.  HENT:  Head: Normocephalic and atraumatic.  Eyes: No scleral icterus.  Neck: No JVD present. No thyromegaly present.  Cardiovascular: Normal rate, regular rhythm and normal heart sounds.  No murmur heard. Pulmonary/Chest: Effort normal and breath sounds normal. He has no rales.  Abdominal: Soft. He exhibits distension. There is no hepatomegaly.  Musculoskeletal:        General: Edema (2+ bilat LE edema) present.  Lymphadenopathy:    He has no cervical adenopathy.   Neurological: He is alert and oriented to person, place, and time.  Skin: Skin is warm and dry.  Psychiatric: He has a normal mood and affect.    ASSESSMENT & PLAN:    1. Chronic heart failure with preserved ejection fraction (HCC) He remains chronically volume overloaded.  We discussed the importance of limiting salt.  I will obtain a BMET today.  I will increase his Lasix to 120 mg in the morning and 80 mg in the afternoon.  Repeat a BMET in 1 week.  He can follow-up with me or Dr. Acie Fredrickson in 3 months.  2. Coronary artery disease involving native coronary artery of native heart without angina pectoris History of CABG in 2015.  He has some occasional chest discomfort which is not typical for ischemia.  His ECG today is stable without acute changes.  He was taken off of aspirin after his most recent admission with a GI bleed.  He was also taken off of statin therapy.  Consider stress testing if his chest symptoms should change or worsen.  3. CKD (chronic kidney disease) stage 3, GFR 30-59 ml/min ([place]) Obtain BMET today and repeat BMET in 1 week after increasing his furosemide.  4. Essential hypertension Borderline control.  Continue to monitor.  5. Hepatic cirrhosis, unspecified hepatic cirrhosis type, unspecified whether ascites present [facility]) Follow-up with primary care and gastroenterology as indicated.  As noted, his statin therapy was discontinued after his most recent admission with GI bleeding.  Consider resuming statin therapy if cleared by primary care/gastroenterology.  6. History of GI bleed As noted, his aspirin was stopped after his most recent admission with GI bleeding.  If cleared by primary care/gastroenterology, we would recommend he resume aspirin therapy.   Dispo:  Return in about 3 months (around 07/24/2019) for Routine Follow Up, w/ Dr. Acie Fredrickson, or Richardson Dopp, PA-C, in person.   Medication Adjustments/Labs and Tests Ordered: Current medicines are reviewed at length  with the patient today.  Concerns regarding medicines are outlined above.   Tests Ordered: Orders Placed This Encounter  Procedures  . Basic metabolic panel  . Basic metabolic panel  . EKG 12-Lead   Medication Changes:  Meds ordered this encounter  Medications  . furosemide (LASIX) 80 MG tablet    Sig: TAKE 120 MG  IN THE AM AND 80 MG IN THE PM BY MOUTH DAILY.    Dispense:  225 tablet    Refill:  3    Signed, Richardson Dopp, PA-C  04/23/2019 [facility] [place], [place], [place]  [address] Phone: [phone]; Fax: [phone]

## 2019-04-23 ENCOUNTER — Ambulatory Visit (INDEPENDENT_AMBULATORY_CARE_PROVIDER_SITE_OTHER): Payer: PPO | Admitting: Physician Assistant

## 2019-04-23 ENCOUNTER — Other Ambulatory Visit: Payer: Self-pay

## 2019-04-23 ENCOUNTER — Encounter: Payer: Self-pay | Admitting: Physician Assistant

## 2019-04-23 VITALS — BP 144/64 | HR 68 | Ht 64.0 in | Wt 249.4 lb

## 2019-04-23 DIAGNOSIS — N183 Chronic kidney disease, stage 3 unspecified: Secondary | ICD-10-CM

## 2019-04-23 DIAGNOSIS — K7460 Unspecified cirrhosis of liver: Secondary | ICD-10-CM | POA: Diagnosis not present

## 2019-04-23 DIAGNOSIS — Z8719 Personal history of other diseases of the digestive system: Secondary | ICD-10-CM

## 2019-04-23 DIAGNOSIS — I5032 Chronic diastolic (congestive) heart failure: Secondary | ICD-10-CM

## 2019-04-23 DIAGNOSIS — I10 Essential (primary) hypertension: Secondary | ICD-10-CM | POA: Diagnosis not present

## 2019-04-23 DIAGNOSIS — I2510 Atherosclerotic heart disease of native coronary artery without angina pectoris: Secondary | ICD-10-CM | POA: Diagnosis not present

## 2019-04-23 MED ORDER — FUROSEMIDE 80 MG PO TABS: ORAL_TABLET | ORAL | 3 refills | Status: DC

## 2019-04-23 NOTE — Patient Instructions (Signed)
Medication Instructions:  Your physician has recommended you make the following change in your medication:   1. INCREASE LASIX TO 120 MG (1.5 TABLET) IN THE AM AND 80 MG (1 TABLET)  IN THE PM.  If you need a refill on your cardiac medications before your next appointment, please call your pharmacy.   Lab work: TODAY: BMET  TO BE DONE IN 1 WEEK: BMET  If you have labs (blood work) drawn today and your tests are completely normal, you will receive your results only by: Marland Kitchen MyChart Message (if you have MyChart) OR . A paper copy in the mail If you have any lab test that is abnormal or we need to change your treatment, we will call you to review the results.  Testing/Procedures: NONE  Follow-Up: At [facility], you and your health needs are our priority.  As part of our continuing mission to provide you with exceptional heart care, we have created designated Provider Care Teams.  These Care Teams include your primary Cardiologist (physician) and Advanced Practice Providers (APPs -  Physician Assistants and Nurse Practitioners) who all work together to provide you with the care you need, when you need it. You will need a follow up appointment in:  3 months.  Please call our office 2 months in advance to schedule this appointment.  You may see Mertie Moores, MD or Richardson Dopp, PA-C  Any Other Special Instructions Will Be Listed Below (If Applicable).

## 2019-04-24 LAB — BASIC METABOLIC PANEL
BUN/Creatinine Ratio: 24 (ref 10–24)
BUN: 26 mg/dL (ref 8–27)
CO2: 22 mmol/L (ref 20–29)
Calcium: 9.7 mg/dL (ref 8.6–10.2)
Chloride: 96 mmol/L (ref 96–106)
Creatinine, Ser: 1.1 mg/dL (ref 0.76–1.27)
GFR calc Af Amer: 77 mL/min/{1.73_m2} (ref >59)
GFR calc non Af Amer: 66 mL/min/{1.73_m2} (ref >59)
Glucose: 255 mg/dL — ABNORMAL HIGH (ref 65–99)
Potassium: 4.6 mmol/L (ref 3.5–5.2)
Sodium: 136 mmol/L (ref 134–144)

## 2019-04-25 ENCOUNTER — Ambulatory Visit: Payer: Self-pay | Admitting: Pharmacist

## 2019-04-25 ENCOUNTER — Encounter: Payer: Self-pay | Admitting: Pharmacist

## 2019-04-30 ENCOUNTER — Other Ambulatory Visit: Payer: PPO

## 2019-04-30 ENCOUNTER — Other Ambulatory Visit: Payer: Self-pay

## 2019-04-30 DIAGNOSIS — K7460 Unspecified cirrhosis of liver: Secondary | ICD-10-CM | POA: Diagnosis not present

## 2019-04-30 DIAGNOSIS — I2510 Atherosclerotic heart disease of native coronary artery without angina pectoris: Secondary | ICD-10-CM

## 2019-04-30 DIAGNOSIS — I10 Essential (primary) hypertension: Secondary | ICD-10-CM

## 2019-04-30 DIAGNOSIS — N183 Chronic kidney disease, stage 3 unspecified: Secondary | ICD-10-CM

## 2019-04-30 DIAGNOSIS — Z8719 Personal history of other diseases of the digestive system: Secondary | ICD-10-CM | POA: Diagnosis not present

## 2019-04-30 DIAGNOSIS — I5032 Chronic diastolic (congestive) heart failure: Secondary | ICD-10-CM | POA: Diagnosis not present

## 2019-04-30 LAB — BASIC METABOLIC PANEL
BUN/Creatinine Ratio: 19 (ref 10–24)
BUN: 28 mg/dL — ABNORMAL HIGH (ref 8–27)
CO2: 23 mmol/L (ref 20–29)
Calcium: 8.7 mg/dL (ref 8.6–10.2)
Chloride: 93 mmol/L — ABNORMAL LOW (ref 96–106)
Creatinine, Ser: 1.44 mg/dL — ABNORMAL HIGH (ref 0.76–1.27)
GFR calc Af Amer: 55 mL/min/{1.73_m2} — ABNORMAL LOW (ref >59)
GFR calc non Af Amer: 48 mL/min/{1.73_m2} — ABNORMAL LOW (ref >59)
Glucose: 344 mg/dL — ABNORMAL HIGH (ref 65–99)
Potassium: 4.6 mmol/L (ref 3.5–5.2)
Sodium: 134 mmol/L (ref 134–144)

## 2019-05-01 ENCOUNTER — Telehealth: Payer: Self-pay

## 2019-05-01 ENCOUNTER — Other Ambulatory Visit: Payer: Self-pay

## 2019-05-01 DIAGNOSIS — L905 Scar conditions and fibrosis of skin: Secondary | ICD-10-CM | POA: Diagnosis not present

## 2019-05-01 DIAGNOSIS — Z79899 Other long term (current) drug therapy: Secondary | ICD-10-CM

## 2019-05-01 DIAGNOSIS — I5032 Chronic diastolic (congestive) heart failure: Secondary | ICD-10-CM

## 2019-05-01 DIAGNOSIS — C44619 Basal cell carcinoma of skin of left upper limb, including shoulder: Secondary | ICD-10-CM | POA: Diagnosis not present

## 2019-05-01 MED ORDER — FUROSEMIDE 80 MG PO TABS: 80.0000 mg | ORAL_TABLET | Freq: Two times a day (BID) | ORAL | 3 refills | Status: DC

## 2019-05-01 NOTE — Telephone Encounter (Signed)
-----   Message from Liliane Shi, [place] sent at 05/01/2019 11:14 AM EDT ----- Creatinine has increased.  Glucose is high.  K+ is normal.   PLAN:   -Decrease Lasix back to 80 mg twice daily   -Repeat BMET 1 week  -FU with PCP for diabetes and send copy of labs to PCP.  Richardson Dopp, PA-C    05/01/2019 11:13 AM

## 2019-05-01 NOTE — Telephone Encounter (Signed)
Spoke with the pts son and he verbalized understanding of Eloy PA recommendation to decrease his Lasix and have a BMET next week.. he will talk with Dr. Sharlet Salina his PCP about the pts BS when he sees her at the [facility] 05/02/19.

## 2019-05-02 ENCOUNTER — Other Ambulatory Visit: Payer: Self-pay

## 2019-05-02 ENCOUNTER — Ambulatory Visit (INDEPENDENT_AMBULATORY_CARE_PROVIDER_SITE_OTHER): Payer: PPO | Admitting: Internal Medicine

## 2019-05-02 ENCOUNTER — Encounter: Payer: Self-pay | Admitting: Internal Medicine

## 2019-05-02 VITALS — BP 142/62 | HR 77 | Temp 98.7°F | Ht 64.0 in | Wt 249.0 lb

## 2019-05-02 DIAGNOSIS — E118 Type 2 diabetes mellitus with unspecified complications: Secondary | ICD-10-CM | POA: Diagnosis not present

## 2019-05-02 LAB — POCT GLYCOSYLATED HEMOGLOBIN (HGB A1C): Hemoglobin A1C: 9.5 % — AB (ref 4.0–5.6)

## 2019-05-02 MED ORDER — GLIMEPIRIDE 2 MG PO TABS: 2.0000 mg | ORAL_TABLET | Freq: Every day | ORAL | 3 refills | Status: DC

## 2019-05-02 MED ORDER — BLOOD GLUCOSE METER KIT: PACK | 0 refills | Status: DC

## 2019-05-02 NOTE — Assessment & Plan Note (Signed)
HgA1c done in office at 9.5 which is significant change from prior. Will add glimepiride 2 mg daily. Rx for meter to monitor sugars. Likely up due to prednisone courses as previously at goal and weight stable.

## 2019-05-02 NOTE — Patient Instructions (Signed)
We have sent in a prescription for glimepiride to take 1 pill daily with first meal of the day.   We have also sent in for a new meter to check the sugar.

## 2019-05-02 NOTE — Progress Notes (Signed)
   Subjective:   Patient ID: [name], male    DOB: [birth year], 73 y.o.   MRN: [id number]  HPI The patient is a 73 YO man coming in for follow up of his diabetes. His sugar was in the 300s on a recent blood draw with cardiology. He has been taking metformin BID as prescribed. He did end up needing two courses of prednisone for a rash in July. Has not been checking sugars at home due to it breaking. Denies change in diet significant. Maybe a little more active due to better breathing and less SOB. Denies numbness or tingling in feet or hands. Watches feet but not daily.   Review of Systems  Constitutional: Negative.   HENT: Negative.   Eyes: Negative.   Respiratory: Negative for cough, chest tightness and shortness of breath.   Cardiovascular: Negative for chest pain, palpitations and leg swelling.  Gastrointestinal: Negative for abdominal distention, abdominal pain, constipation, diarrhea, nausea and vomiting.  Musculoskeletal: Negative.   Skin: Negative.   Neurological: Negative.   Psychiatric/Behavioral: Negative.     Objective:  Physical Exam Constitutional:      Appearance: He is well-developed. He is obese.  HENT:     Head: Normocephalic and atraumatic.  Neck:     Musculoskeletal: Normal range of motion.  Cardiovascular:     Rate and Rhythm: Normal rate and regular rhythm.  Pulmonary:     Effort: Pulmonary effort is normal. No respiratory distress.     Breath sounds: Normal breath sounds. No wheezing or rales.  Abdominal:     General: Bowel sounds are normal. There is no distension.     Palpations: Abdomen is soft.     Tenderness: There is no abdominal tenderness. There is no rebound.  Musculoskeletal:     Right lower leg: Edema present.     Left lower leg: Edema present.     Comments: Foot exam done  Skin:    General: Skin is warm and dry.  Neurological:     Mental Status: He is alert and oriented to person, place, and time.     Coordination: Coordination  normal.     Vitals:   05/02/19 1251  BP: (!) 142/62  Pulse: 77  Temp: 98.7 F (37.1 C)  TempSrc: Oral  SpO2: 97%  Weight: 249 lb (112.9 kg)  Height: 5\' 4"  (1.626 m)    Assessment & Plan:

## 2019-05-08 ENCOUNTER — Other Ambulatory Visit: Payer: PPO

## 2019-05-08 DIAGNOSIS — C44519 Basal cell carcinoma of skin of other part of trunk: Secondary | ICD-10-CM | POA: Diagnosis not present

## 2019-05-14 ENCOUNTER — Other Ambulatory Visit: Payer: Self-pay | Admitting: *Deleted

## 2019-05-14 DIAGNOSIS — E118 Type 2 diabetes mellitus with unspecified complications: Secondary | ICD-10-CM

## 2019-05-14 MED ORDER — GABAPENTIN 100 MG PO CAPS: ORAL_CAPSULE | ORAL | 3 refills | Status: DC

## 2019-05-30 ENCOUNTER — Other Ambulatory Visit: Payer: Self-pay

## 2019-05-30 ENCOUNTER — Telehealth: Payer: Self-pay | Admitting: Internal Medicine

## 2019-05-30 MED ORDER — METFORMIN HCL 850 MG PO TABS: 850.0000 mg | ORAL_TABLET | Freq: Two times a day (BID) | ORAL | 3 refills | Status: DC

## 2019-05-30 NOTE — Telephone Encounter (Signed)
Rec'd from [place] forwarded 7 pages to Dr.

## 2019-07-09 ENCOUNTER — Telehealth: Payer: Self-pay | Admitting: Internal Medicine

## 2019-07-09 NOTE — Telephone Encounter (Signed)
[facility] HIM Dept received 7 pages from [place]. Sending interoffice mail to [employer] 07/09/19  KLM

## 2019-07-16 ENCOUNTER — Other Ambulatory Visit: Payer: Self-pay | Admitting: *Deleted

## 2019-07-16 MED ORDER — SPIRONOLACTONE 50 MG PO TABS: 50.0000 mg | ORAL_TABLET | Freq: Every day | ORAL | 3 refills | Status: DC

## 2019-07-18 ENCOUNTER — Ambulatory Visit: Payer: PPO | Admitting: *Deleted

## 2019-07-24 ENCOUNTER — Ambulatory Visit: Payer: PPO | Admitting: Cardiovascular Disease

## 2019-07-27 ENCOUNTER — Encounter (HOSPITAL_COMMUNITY): Payer: Self-pay | Admitting: Emergency Medicine

## 2019-07-27 ENCOUNTER — Emergency Department (HOSPITAL_COMMUNITY)
Admission: EM | Admit: 2019-07-27 | Discharge: 2019-07-27 | Disposition: A | Discharge status: Home / Self Care | Payer: PPO | Source: Home / Self Care | Attending: Emergency Medicine | Admitting: Emergency Medicine

## 2019-07-27 ENCOUNTER — Emergency Department (HOSPITAL_COMMUNITY): Payer: PPO

## 2019-07-27 ENCOUNTER — Other Ambulatory Visit: Payer: Self-pay

## 2019-07-27 DIAGNOSIS — N183 Chronic kidney disease, stage 3 unspecified: Secondary | ICD-10-CM | POA: Insufficient documentation

## 2019-07-27 DIAGNOSIS — R0602 Shortness of breath: Secondary | ICD-10-CM | POA: Insufficient documentation

## 2019-07-27 DIAGNOSIS — E1122 Type 2 diabetes mellitus with diabetic chronic kidney disease: Secondary | ICD-10-CM | POA: Insufficient documentation

## 2019-07-27 DIAGNOSIS — Z20828 Contact with and (suspected) exposure to other viral communicable diseases: Secondary | ICD-10-CM | POA: Diagnosis not present

## 2019-07-27 DIAGNOSIS — I509 Heart failure, unspecified: Secondary | ICD-10-CM | POA: Diagnosis not present

## 2019-07-27 DIAGNOSIS — I130 Hypertensive heart and chronic kidney disease with heart failure and stage 1 through stage 4 chronic kidney disease, or unspecified chronic kidney disease: Secondary | ICD-10-CM | POA: Diagnosis not present

## 2019-07-27 DIAGNOSIS — J45909 Unspecified asthma, uncomplicated: Secondary | ICD-10-CM | POA: Insufficient documentation

## 2019-07-27 DIAGNOSIS — Z951 Presence of aortocoronary bypass graft: Secondary | ICD-10-CM | POA: Insufficient documentation

## 2019-07-27 DIAGNOSIS — Z87891 Personal history of nicotine dependence: Secondary | ICD-10-CM | POA: Insufficient documentation

## 2019-07-27 DIAGNOSIS — Z7984 Long term (current) use of oral hypoglycemic drugs: Secondary | ICD-10-CM | POA: Insufficient documentation

## 2019-07-27 DIAGNOSIS — I110 Hypertensive heart disease with heart failure: Secondary | ICD-10-CM | POA: Diagnosis not present

## 2019-07-27 DIAGNOSIS — I2510 Atherosclerotic heart disease of native coronary artery without angina pectoris: Secondary | ICD-10-CM | POA: Insufficient documentation

## 2019-07-27 DIAGNOSIS — R509 Fever, unspecified: Secondary | ICD-10-CM | POA: Diagnosis not present

## 2019-07-27 DIAGNOSIS — Z79899 Other long term (current) drug therapy: Secondary | ICD-10-CM | POA: Insufficient documentation

## 2019-07-27 LAB — CBC WITH DIFFERENTIAL/PLATELET
Abs Immature Granulocytes: 0.01 10*3/uL (ref 0.00–0.07)
Basophils Absolute: 0 10*3/uL (ref 0.0–0.1)
Basophils Relative: 0 %
Eosinophils Absolute: 0.1 10*3/uL (ref 0.0–0.5)
Eosinophils Relative: 3 %
HCT: 30.6 % — ABNORMAL LOW (ref 39.0–52.0)
Hemoglobin: 8.9 g/dL — ABNORMAL LOW (ref 13.0–17.0)
Immature Granulocytes: 0 %
Lymphocytes Relative: 13 %
Lymphs Abs: 0.5 10*3/uL — ABNORMAL LOW (ref 0.7–4.0)
MCH: 25.6 pg — ABNORMAL LOW (ref 26.0–34.0)
MCHC: 29.1 g/dL — ABNORMAL LOW (ref 30.0–36.0)
MCV: 88.2 fL (ref 80.0–100.0)
Monocytes Absolute: 0.3 10*3/uL (ref 0.1–1.0)
Monocytes Relative: 7 %
Neutro Abs: 3 10*3/uL (ref 1.7–7.7)
Neutrophils Relative %: 77 %
Platelets: 124 10*3/uL — ABNORMAL LOW (ref 150–400)
RBC: 3.47 MIL/uL — ABNORMAL LOW (ref 4.22–5.81)
RDW: 15.6 % — ABNORMAL HIGH (ref 11.5–15.5)
WBC: 4 10*3/uL (ref 4.0–10.5)
nRBC: 0 % (ref 0.0–0.2)

## 2019-07-27 LAB — COMPREHENSIVE METABOLIC PANEL
ALT: 20 U/L (ref 0–44)
AST: 28 U/L (ref 15–41)
Albumin: 3.4 g/dL — ABNORMAL LOW (ref 3.5–5.0)
Alkaline Phosphatase: 107 U/L (ref 38–126)
Anion gap: 9 (ref 5–15)
BUN: 20 mg/dL (ref 8–23)
CO2: 23 mmol/L (ref 22–32)
Calcium: 8.6 mg/dL — ABNORMAL LOW (ref 8.9–10.3)
Chloride: 105 mmol/L (ref 98–111)
Creatinine, Ser: 1.25 mg/dL — ABNORMAL HIGH (ref 0.61–1.24)
GFR calc Af Amer: >60 mL/min (ref >60)
GFR calc non Af Amer: 57 mL/min — ABNORMAL LOW (ref >60)
Glucose, Bld: 216 mg/dL — ABNORMAL HIGH (ref 70–99)
Potassium: 3.7 mmol/L (ref 3.5–5.1)
Sodium: 137 mmol/L (ref 135–145)
Total Bilirubin: 0.9 mg/dL (ref 0.3–1.2)
Total Protein: 6.6 g/dL (ref 6.5–8.1)

## 2019-07-27 LAB — BRAIN NATRIURETIC PEPTIDE: B Natriuretic Peptide: 228.3 pg/mL — ABNORMAL HIGH (ref 0.0–100.0)

## 2019-07-27 LAB — URINALYSIS, ROUTINE W REFLEX MICROSCOPIC
Bacteria, UA: NONE SEEN
Bilirubin Urine: NEGATIVE
Glucose, UA: 50 mg/dL — AB
Hgb urine dipstick: NEGATIVE
Ketones, ur: NEGATIVE mg/dL
Leukocytes,Ua: NEGATIVE
Nitrite: NEGATIVE
Protein, ur: 100 mg/dL — AB
Specific Gravity, Urine: 1.02 (ref 1.005–1.030)
pH: 5 (ref 5.0–8.0)

## 2019-07-27 MED ORDER — FUROSEMIDE 20 MG PO TABS: 60.0000 mg | ORAL_TABLET | Freq: Two times a day (BID) | ORAL | 0 refills | Status: DC

## 2019-07-27 MED ORDER — FUROSEMIDE 10 MG/ML IJ SOLN
100.0000 mg | Freq: Once | INTRAVENOUS | Status: AC
  Administered 2019-07-27: 14:00:00 100 mg via INTRAVENOUS
  Filled 2019-07-27: qty 10

## 2019-07-27 NOTE — ED Notes (Signed)
Patient verbalizes understanding of discharge instructions. Opportunity for questioning and answers were provided. Armband removed by staff, pt discharged from ED ambulatory.   

## 2019-07-27 NOTE — ED Triage Notes (Signed)
Pt coming from home today complaining of leg swelling and shortness of breath. Pt has history of CHF. Pt states he takes Lasix but has not taken any medications today. A&Ox4.

## 2019-07-27 NOTE — ED Provider Notes (Signed)
[facility] EMERGENCY DEPARTMENT Provider Note   CSN: [id number] Arrival date & time: 07/27/19  1228     History   Chief Complaint Chief Complaint  Patient presents with  . Shortness of Breath  . Leg Swelling    HPI [name] is a 73 y.o. male.     HPI Patient presents with some dyspnea, fatigue, weight gain, swelling in both lower extremities and weakness. Onset was subtle, within the past 2 days, but now, over the past day at least, patient has had substantial limitation in ability to perform ADLs secondary to dyspnea. He continues to take medication as directed, including Lasix, 80 mg, daily. He does have some discomfort in both legs, as he notes that he has had profound amounts of swelling bilaterally, but denies other pain including chest pain, abdominal pain. No fever, nausea, vomiting, diarrhea either. With concern for weakness, swelling, dyspnea as above who presents for evaluation. Past Medical History:  Diagnosis Date  . Arthritis    "joints tighten up sometimes" (03/27/2104)  . Asthma   . CHF (congestive heart failure) ([place])   . Chronic low back pain   . Chronic lower back pain   . Coronary artery disease    a. 05/2012 Cath/PCI: LM 40ost, 50-60d, LAD 99p ruptured plaque (3.0x28 DES), LCX 50p/m, RCA 30-40p, 64m EF 65-70%.  . Family history of adverse reaction to anesthesia    Pt granddaughter had PONV  . GERD (gastroesophageal reflux disease)   . Hepatic cirrhosis ([place]    a. Dx 01/2014 - CT a/p   . History of blood transfusion    "related to bleeding ulcers"  . History of concussion    1976--  NO RESIDUAL  . History of GI bleed    a. UGIB 07/2012;  b. 01/2014 admission with GIB/FOB stool req 1U prbc's->EGD showed portal gastropathy, barrett's esoph, and chronic active h. pylori gastritis.  .Marland KitchenHistory of gout    2007 &  2008  LEFT LEG-- NO ISSUE SINCE  . Hyperlipidemia   . Iron deficiency anemia   . Kidney stones   . Lipoma   . OA  (osteoarthritis of spine)    LOWER BACK--  INTERMITTANT LEFT LEG NUMBNESS  . OSA (obstructive sleep apnea)    PULMOLOGIST-  DR CLANCE--  MODERATE OSA  STARTED CPAP 2012--  BUT CURRENTLY HAS NOT USED PAST 6 MONTHS  . Phimosis    a. s/p circumcision 2015.  . Type 2 diabetes mellitus ([place]   . Unspecified essential hypertension     Patient Active Problem List   Diagnosis Date Noted  . Itching 03/22/2019  . Skin lesion 03/22/2019  . Hyponatremia 12/24/2018  . (HFpEF) heart failure with preserved ejection fraction ([place] 12/24/2018  . Precordial chest pain   . Neck pain 09/12/2018  . CKD (chronic kidney disease), stage III 12/18/2017  . Symptomatic anemia 12/18/2017  . Peptic ulcer disease 12/18/2017  . GI bleed 09/29/2017  . SOB (shortness of breath) 09/21/2017  . Morbid obesity ([place] 02/05/2016  . Numerous moles 09/15/2015  . S/P CABG x 3 03/31/2014  . Hepatic cirrhosis ([place] 02/27/2014  . Acute blood loss anemia 08/13/2012  . Hyperlipidemia with target LDL less than 70 06/29/2012  . Thrombocytopenia ([place] 06/29/2012  . Coronary artery disease    . OSA (obstructive sleep apnea)   . Type 2 diabetes with complication (HRanchester 140/05/2724 . GERD 08/04/2010  . History of gout   . Hypertensive heart disease  Past Surgical History:  Procedure Laterality Date  . ANKLE FRACTURE SURGERY Right 1989   "plate put in"  . APPENDECTOMY  05-16-2004   open  . CIRCUMCISION N/A 09/09/2013   Procedure: CIRCUMCISION ADULT;  Surgeon: Bernestine Amass, MD;  Location: [facility];  Service: Urology;  Laterality: N/A;  . COLECTOMY  05-16-2004  . CORONARY ANGIOPLASTY WITH STENT PLACEMENT  06/28/2012  DR COOPER   PCI W/  X1 DES to [place]. LAD/  LM  40% OSTIAL & 50-60% DISTAL /  50% PROX LCX/  30-40% PROX RCA & 50% MID RCA/   LVEF 65-70%  . CORONARY ARTERY BYPASS GRAFT N/A 03/31/2014   Procedure: CORONARY ARTERY BYPASS GRAFTING (CABG) times 3 using left internal mammary artery and right  saphenous vein.;  Surgeon: Melrose Nakayama, MD;  Location: [facility];  Service: Open Heart Surgery;  Laterality: N/A;  . DOBUTAMINE STRESS ECHO  06-08-2012   MODERATE HYPOKINESIS/ ISCHEMIA MID INFERIOR WALL  . ESOPHAGOGASTRODUODENOSCOPY  08/15/2012   Procedure: ESOPHAGOGASTRODUODENOSCOPY (EGD);  Surgeon: Wonda Horner, MD;  Location: [facility] ENDOSCOPY;  Service: Endoscopy;  Laterality: N/A;  . ESOPHAGOGASTRODUODENOSCOPY N/A 02/17/2014   Procedure: ESOPHAGOGASTRODUODENOSCOPY (EGD);  Surgeon: Jeryl Columbia, MD;  Location[facility] ENDOSCOPY;  Service: Endoscopy;  Laterality: N/A;  . ESOPHAGOGASTRODUODENOSCOPY (EGD) WITH PROPOFOL N/A 09/30/2017   Procedure: ESOPHAGOGASTRODUODENOSCOPY (EGD) WITH PROPOFOL;  Surgeon: Wonda Horner, MD;  Location: [facility] ENDOSCOPY;  Service: Endoscopy;  Laterality: N/A;  . ESOPHAGOGASTRODUODENOSCOPY (EGD) WITH PROPOFOL N/A 12/20/2017   Procedure: ESOPHAGOGASTRODUODENOSCOPY (EGD) WITH PROPOFOL;  Surgeon: Clarene Essex, MD;  Location: [facility];  Service: Endoscopy;  Laterality: N/A;  . HERNIA REPAIR    . INTRAOPERATIVE TRANSESOPHAGEAL ECHOCARDIOGRAM N/A 03/31/2014   Procedure: INTRAOPERATIVE TRANSESOPHAGEAL ECHOCARDIOGRAM;  Surgeon: Melrose Nakayama, MD;  Location: [facility];  Service: Open Heart Surgery;  Laterality: N/A;  . LAPAROSCOPIC UMBILICAL HERNIA REPAIR W/ MESH  06-06-2011  . LEFT HEART CATHETERIZATION WITH CORONARY ANGIOGRAM N/A 03/28/2014   Procedure: LEFT HEART CATHETERIZATION WITH CORONARY ANGIOGRAM;  Surgeon: Sinclair Grooms, MD;  Location: [facility] CATH LAB;  Service: Cardiovascular;  Laterality: N/A;  . LIPOMA EXCISION Left 08/25/2017   Procedure: EXCISION LIPOMA LEFT POSTERIOR THIGH;  Surgeon: Clovis Riley, MD;  Location: [facility];  Service: General;  Laterality: Left;  . NEPHROLITHOTOMY  1990'S  . OPEN APPENDECTOMY W/ PARTIAL CECECTOMY  05-16-2004  . PERCUTANEOUS CORONARY STENT INTERVENTION (PCI-S) N/A 06/28/2012   Procedure: PERCUTANEOUS CORONARY STENT INTERVENTION  (PCI-S);  Surgeon: Sherren Mocha, MD;  Location: [facility] C[facility] LAB;  Service: Cardiovascular;  Laterality: N/A;        Home Medications    Prior to Admission medications   Medication Sig Start Date End Date Taking? Authorizing Provider  albuterol (VENTOLIN HFA) 108 (90 Base) MCG/ACT inhaler Inhale 2 puffs into the lungs every 6 (six) hours as needed for wheezing or shortness of breath.   Yes [provider]  ferrous sulfate 325 (65 FE) MG EC tablet Take 1 tablet (325 mg total) by mouth daily with breakfast. Patient taking differently: Take 325 mg by mouth daily with supper.  04/20/18  Yes [name], MD  furosemide (LASIX) 80 MG tablet Take 1 tablet (80 mg total) by mouth 2 (two) times daily. Patient taking differently: Take 80 mg by mouth daily.  05/01/19  Yes Weaver, Scott T, PA-C  gabapentin (NEURONTIN) 100 MG capsule TAKE 1 CAPSULE(100 MG) BY MOUTH TWICE DAILY Patient taking differently: Take 100 mg by mouth 2 (  two) times daily.  05/14/19  Yes [name], MD  metFORMIN (GLUCOPHAGE) 850 MG tablet Take 1 tablet (850 mg total) by mouth 2 (two) times daily with a meal. 05/30/19  Yes [name], MD  nitroGLYCERIN (NITROSTAT) 0.4 MG SL tablet Place 1 tablet (0.4 mg total) under the tongue every 5 (five) minutes as needed for chest pain. 06/08/16  Yes Nahser, Wonda Cheng, MD  spironolactone (ALDACTONE) 50 MG tablet Take 1 tablet (50 mg total) by mouth daily. 07/16/19  Yes [name], MD  umeclidinium-vilanterol (ANORO ELLIPTA) 62.5-25 MCG/INH AEPB Inhale 1 puff into the lungs daily. 02/05/19  Yes [name], MD  acetaminophen (TYLENOL) 325 MG tablet Take 2 tablets (650 mg total) by mouth every 6 (six) hours as needed. Patient not taking: Reported on 07/27/2019 05/25/18   Varney Biles, MD  blood glucose meter kit and supplies Dispense based on patient and insurance preference. Use up to four times daily as directed. (FOR ICD-10 E10.9, E11.9).  05/02/19   [name], MD  famotidine (PEPCID) 20 MG tablet Take 1 tablet (20 mg total) by mouth 2 (two) times daily as needed (itching). Patient not taking: Reported on 07/27/2019 03/11/19   Rodell Perna A, PA-C  glimepiride (AMARYL) 2 MG tablet Take 1 tablet (2 mg total) by mouth daily before breakfast. Patient not taking: Reported on 07/27/2019 05/02/19   [name], MD  hydrOXYzine (ATARAX/VISTARIL) 10 MG tablet Take 1 tablet (10 mg total) by mouth every 6 (six) hours as needed for itching. Patient not taking: Reported on 07/27/2019 03/22/19   Biagio Borg, MD  polyethylene glycol [facility] / Floria Raveling) packet Take 17 g by mouth 2 (two) times daily. Patient not taking: Reported on 07/27/2019 10/05/17   Roxan Hockey, MD    Family History Family History  Problem Relation Age of Onset  . Lung cancer Sister   . Cancer Sister        lung  . Cancer Mother   . Cancer Father        died in his 52s.  . Cancer Brother        lung  . Coronary artery disease Other   . Diabetes Other   . Colon cancer Other   . Cancer Sister        lung    Social History Social History   Tobacco Use  . Smoking status: Former Smoker    Packs/day: 2.00    Years: 40.00    Pack years: 80.00    Types: Cigarettes    Quit date: 08/29/1996    Years since quitting: 22.9  . Smokeless tobacco: Never Used  Substance Use Topics  . Alcohol use: Yes    Alcohol/week: 0.0 standard drinks    Comment: occasional  . Drug use: No     Allergies   Patient has no known allergies.   Review of Systems Review of Systems  Constitutional:       Per HPI, otherwise negative  HENT:       Per HPI, otherwise negative  Respiratory:       Per HPI, otherwise negative  Cardiovascular:       Per HPI, otherwise negative  Gastrointestinal: Negative for vomiting.  Endocrine:       Negative aside from HPI  Genitourinary:       Neg aside from HPI   Musculoskeletal:       Per HPI, otherwise negative   Skin: Negative.   Neurological:  Positive for weakness. Negative for syncope.     Physical Exam Updated Vital Signs BP (!) 170/69 (BP Location: Right Arm)   Pulse 73   Temp 98 F (36.7 C) (Oral)   Resp 16   Ht '5\' 8"'  (1.727 m)   Wt 111.1 kg   SpO2 98%   BMI 37.25 kg/m   Physical Exam Vitals signs and nursing note reviewed.  Constitutional:      General: He is not in acute distress.    Appearance: He is well-developed.  HENT:     Head: Normocephalic and atraumatic.  Eyes:     Conjunctiva/sclera: Conjunctivae normal.  Cardiovascular:     Rate and Rhythm: Regular rhythm. Tachycardia present.  Pulmonary:     Effort: Tachypnea and accessory muscle usage present.     Breath sounds: Decreased breath sounds and wheezing present.  Abdominal:     General: There is no distension.  Musculoskeletal:     Right lower leg: Edema present.     Left lower leg: Edema present.  Skin:    General: Skin is warm and dry.  Neurological:     Mental Status: He is alert and oriented to person, place, and time.      ED Treatments / Results  Labs (all labs ordered are listed, but only abnormal results are displayed) Labs Reviewed  COMPREHENSIVE METABOLIC PANEL - Abnormal; Notable for the following components:      Result Value   Glucose, Bld 216 (*)    Creatinine, Ser 1.25 (*)    Calcium 8.6 (*)    Albumin 3.4 (*)    GFR calc non Af Amer 57 (*)    All other components within normal limits  CBC WITH DIFFERENTIAL/PLATELET - Abnormal; Notable for the following components:   RBC 3.47 (*)    Hemoglobin 8.9 (*)    HCT 30.6 (*)    MCH 25.6 (*)    MCHC 29.1 (*)    RDW 15.6 (*)    Platelets 124 (*)    Lymphs Abs 0.5 (*)    All other components within normal limits  URINALYSIS, ROUTINE W REFLEX MICROSCOPIC - Abnormal; Notable for the following components:   Glucose, UA 50 (*)    Protein, ur 100 (*)    All other components within normal limits  BRAIN NATRIURETIC PEPTIDE - Abnormal;  Notable for the following components:   B Natriuretic Peptide 228.3 (*)    All other components within normal limits    EKG EKG Interpretation  Date/Time:  Saturday July 27 2019 12:49:36 EST Ventricular Rate:  72 PR Interval:    QRS Duration: 110 QT Interval:  415 QTC Calculation: 455 R Axis:   102 Text Interpretation: Sinus rhythm Right axis deviation Low voltage, precordial leads Artifact Abnormal ECG Confirmed by Carmin Muskrat [phone]) on 07/27/2019 12:57:22 PM   Radiology Dg Chest Port 1 View  Result Date: 07/27/2019 CLINICAL DATA:  73 year old presenting with a 1 week history of shortness of breath and BILATERAL lower extremity edema. EXAM: PORTABLE CHEST 1 VIEW COMPARISON:  12/24/2018 and earlier. FINDINGS: Prior sternotomy for CABG. Cardiac silhouette moderately enlarged, unchanged. Moderate diffuse interstitial pulmonary edema throughout both lungs. No confluent airspace consolidation. No visible pleural effusions. IMPRESSION: Moderate CHF and/or fluid overload, with stable moderate cardiomegaly and moderate diffuse interstitial pulmonary edema throughout both lungs. Electronically Signed   By: Evangeline Dakin M.D.   On: 07/27/2019 13:49    Procedures Procedures (including critical care time)  Medications Ordered in ED  Medications  furosemide (LASIX) 100 mg in dextrose 5 % 50 mL IVPB (100 mg Intravenous New Bag/Given 07/27/19 1358)     Initial Impression / Assessment and Plan / ED Course  I have reviewed the triage vital signs and the nursing notes.  Pertinent labs & imaging results that were available during my care of the patient were reviewed by me and considered in my medical decision making (see chart for details).    Chart review notable for patient's prior recommendation for 80 mg, twice daily Lasix, now it seems as though he is taking 80 mg, daily.    2:32 PM Patient is awake, alert, has no oxygen requirement. Labs reviewed, and discussed with him,  x-ray as well. Patient found to have elevated BNP, renal function that is consistent with prior abnormalities, slightly improved. No evidence for ischemia on EKG, and absent chest pain, low suspicion for atypical ACS. With concern for acute heart failure exacerbation he has received IV Lasix. I discussed admission versus discharge with close outpatient follow-up with clinic with him at length. If the patient is able to diurese, and feels better, he will be discharged with outpatient follow-up, with increase Lasix for the next 3 days.   Final Clinical Impressions(s) / ED Diagnoses  Shortness of breath   Carmin Muskrat, MD 07/27/19 1437

## 2019-07-27 NOTE — Discharge Instructions (Signed)
As discussed, there is some suspicion for your congestive heart failure leading to your increased difficulty breathing over the past days. For the next 3 days, please take 120 mg, Lasix daily-split in 2 doses. It is important to schedule follow-up with our heart failure clinic.  Return here for concerning changes in your condition.

## 2019-07-29 ENCOUNTER — Encounter (HOSPITAL_COMMUNITY): Payer: Self-pay

## 2019-07-29 ENCOUNTER — Emergency Department (HOSPITAL_COMMUNITY): Payer: PPO

## 2019-07-29 ENCOUNTER — Other Ambulatory Visit: Payer: Self-pay

## 2019-07-29 ENCOUNTER — Inpatient Hospital Stay (HOSPITAL_COMMUNITY)
Admission: EM | Admit: 2019-07-29 | Discharge: 2019-08-07 | DRG: 291 | Disposition: A | Discharge status: Home Health | Payer: PPO | Attending: Family Medicine | Admitting: Family Medicine

## 2019-07-29 DIAGNOSIS — Z955 Presence of coronary angioplasty implant and graft: Secondary | ICD-10-CM

## 2019-07-29 DIAGNOSIS — D631 Anemia in chronic kidney disease: Secondary | ICD-10-CM | POA: Diagnosis present

## 2019-07-29 DIAGNOSIS — R609 Edema, unspecified: Secondary | ICD-10-CM | POA: Diagnosis not present

## 2019-07-29 DIAGNOSIS — Z20828 Contact with and (suspected) exposure to other viral communicable diseases: Secondary | ICD-10-CM | POA: Diagnosis present

## 2019-07-29 DIAGNOSIS — E118 Type 2 diabetes mellitus with unspecified complications: Secondary | ICD-10-CM | POA: Diagnosis present

## 2019-07-29 DIAGNOSIS — Z79899 Other long term (current) drug therapy: Secondary | ICD-10-CM

## 2019-07-29 DIAGNOSIS — M545 Low back pain: Secondary | ICD-10-CM | POA: Diagnosis present

## 2019-07-29 DIAGNOSIS — E785 Hyperlipidemia, unspecified: Secondary | ICD-10-CM | POA: Diagnosis present

## 2019-07-29 DIAGNOSIS — E669 Obesity, unspecified: Secondary | ICD-10-CM | POA: Diagnosis present

## 2019-07-29 DIAGNOSIS — E8779 Other fluid overload: Secondary | ICD-10-CM | POA: Diagnosis not present

## 2019-07-29 DIAGNOSIS — E1122 Type 2 diabetes mellitus with diabetic chronic kidney disease: Secondary | ICD-10-CM | POA: Diagnosis present

## 2019-07-29 DIAGNOSIS — I5033 Acute on chronic diastolic (congestive) heart failure: Secondary | ICD-10-CM | POA: Diagnosis present

## 2019-07-29 DIAGNOSIS — Z6837 Body mass index (BMI) 37.0-37.9, adult: Secondary | ICD-10-CM

## 2019-07-29 DIAGNOSIS — R509 Fever, unspecified: Secondary | ICD-10-CM

## 2019-07-29 DIAGNOSIS — R0602 Shortness of breath: Secondary | ICD-10-CM | POA: Diagnosis not present

## 2019-07-29 DIAGNOSIS — N1831 Chronic kidney disease, stage 3a: Secondary | ICD-10-CM | POA: Diagnosis present

## 2019-07-29 DIAGNOSIS — Z8249 Family history of ischemic heart disease and other diseases of the circulatory system: Secondary | ICD-10-CM

## 2019-07-29 DIAGNOSIS — J45909 Unspecified asthma, uncomplicated: Secondary | ICD-10-CM | POA: Diagnosis present

## 2019-07-29 DIAGNOSIS — G8929 Other chronic pain: Secondary | ICD-10-CM | POA: Diagnosis present

## 2019-07-29 DIAGNOSIS — Z87442 Personal history of urinary calculi: Secondary | ICD-10-CM

## 2019-07-29 DIAGNOSIS — G4733 Obstructive sleep apnea (adult) (pediatric): Secondary | ICD-10-CM | POA: Diagnosis present

## 2019-07-29 DIAGNOSIS — Z7984 Long term (current) use of oral hypoglycemic drugs: Secondary | ICD-10-CM

## 2019-07-29 DIAGNOSIS — R52 Pain, unspecified: Secondary | ICD-10-CM | POA: Diagnosis not present

## 2019-07-29 DIAGNOSIS — K7460 Unspecified cirrhosis of liver: Secondary | ICD-10-CM | POA: Diagnosis present

## 2019-07-29 DIAGNOSIS — I130 Hypertensive heart and chronic kidney disease with heart failure and stage 1 through stage 4 chronic kidney disease, or unspecified chronic kidney disease: Principal | ICD-10-CM | POA: Diagnosis present

## 2019-07-29 DIAGNOSIS — Z951 Presence of aortocoronary bypass graft: Secondary | ICD-10-CM

## 2019-07-29 DIAGNOSIS — I509 Heart failure, unspecified: Secondary | ICD-10-CM | POA: Diagnosis not present

## 2019-07-29 DIAGNOSIS — K219 Gastro-esophageal reflux disease without esophagitis: Secondary | ICD-10-CM | POA: Diagnosis present

## 2019-07-29 DIAGNOSIS — Z801 Family history of malignant neoplasm of trachea, bronchus and lung: Secondary | ICD-10-CM

## 2019-07-29 DIAGNOSIS — I5030 Unspecified diastolic (congestive) heart failure: Secondary | ICD-10-CM | POA: Diagnosis present

## 2019-07-29 DIAGNOSIS — I10 Essential (primary) hypertension: Secondary | ICD-10-CM | POA: Diagnosis not present

## 2019-07-29 DIAGNOSIS — Z800 Family history of malignant neoplasm of digestive organs: Secondary | ICD-10-CM

## 2019-07-29 DIAGNOSIS — I110 Hypertensive heart disease with heart failure: Secondary | ICD-10-CM | POA: Diagnosis not present

## 2019-07-29 DIAGNOSIS — M109 Gout, unspecified: Secondary | ICD-10-CM | POA: Diagnosis present

## 2019-07-29 DIAGNOSIS — I872 Venous insufficiency (chronic) (peripheral): Secondary | ICD-10-CM | POA: Diagnosis present

## 2019-07-29 DIAGNOSIS — Z833 Family history of diabetes mellitus: Secondary | ICD-10-CM

## 2019-07-29 DIAGNOSIS — I2510 Atherosclerotic heart disease of native coronary artery without angina pectoris: Secondary | ICD-10-CM | POA: Diagnosis present

## 2019-07-29 DIAGNOSIS — Z87891 Personal history of nicotine dependence: Secondary | ICD-10-CM

## 2019-07-29 DIAGNOSIS — E8770 Fluid overload, unspecified: Secondary | ICD-10-CM | POA: Diagnosis present

## 2019-07-29 LAB — URINALYSIS, ROUTINE W REFLEX MICROSCOPIC
Bilirubin Urine: NEGATIVE
Glucose, UA: NEGATIVE mg/dL
Hgb urine dipstick: NEGATIVE
Ketones, ur: NEGATIVE mg/dL
Leukocytes,Ua: NEGATIVE
Nitrite: NEGATIVE
Protein, ur: NEGATIVE mg/dL
Specific Gravity, Urine: 1.01 (ref 1.005–1.030)
pH: 6 (ref 5.0–8.0)

## 2019-07-29 LAB — COMPREHENSIVE METABOLIC PANEL
ALT: 20 U/L (ref 0–44)
AST: 25 U/L (ref 15–41)
Albumin: 3.4 g/dL — ABNORMAL LOW (ref 3.5–5.0)
Alkaline Phosphatase: 96 U/L (ref 38–126)
Anion gap: 11 (ref 5–15)
BUN: 17 mg/dL (ref 8–23)
CO2: 25 mmol/L (ref 22–32)
Calcium: 8.4 mg/dL — ABNORMAL LOW (ref 8.9–10.3)
Chloride: 101 mmol/L (ref 98–111)
Creatinine, Ser: 1.23 mg/dL (ref 0.61–1.24)
GFR calc Af Amer: >60 mL/min (ref >60)
GFR calc non Af Amer: 58 mL/min — ABNORMAL LOW (ref >60)
Glucose, Bld: 177 mg/dL — ABNORMAL HIGH (ref 70–99)
Potassium: 3.5 mmol/L (ref 3.5–5.1)
Sodium: 137 mmol/L (ref 135–145)
Total Bilirubin: 0.6 mg/dL (ref 0.3–1.2)
Total Protein: 7.1 g/dL (ref 6.5–8.1)

## 2019-07-29 LAB — BRAIN NATRIURETIC PEPTIDE: B Natriuretic Peptide: 223.9 pg/mL — ABNORMAL HIGH (ref 0.0–100.0)

## 2019-07-29 LAB — CBC WITH DIFFERENTIAL/PLATELET
Abs Immature Granulocytes: 0.02 10*3/uL (ref 0.00–0.07)
Basophils Absolute: 0 10*3/uL (ref 0.0–0.1)
Basophils Relative: 0 %
Eosinophils Absolute: 0 10*3/uL (ref 0.0–0.5)
Eosinophils Relative: 1 %
HCT: 30 % — ABNORMAL LOW (ref 39.0–52.0)
Hemoglobin: 9 g/dL — ABNORMAL LOW (ref 13.0–17.0)
Immature Granulocytes: 0 %
Lymphocytes Relative: 11 %
Lymphs Abs: 0.6 10*3/uL — ABNORMAL LOW (ref 0.7–4.0)
MCH: 25.9 pg — ABNORMAL LOW (ref 26.0–34.0)
MCHC: 30 g/dL (ref 30.0–36.0)
MCV: 86.2 fL (ref 80.0–100.0)
Monocytes Absolute: 0.5 10*3/uL (ref 0.1–1.0)
Monocytes Relative: 8 %
Neutro Abs: 4.3 10*3/uL (ref 1.7–7.7)
Neutrophils Relative %: 80 %
Platelets: 135 10*3/uL — ABNORMAL LOW (ref 150–400)
RBC: 3.48 MIL/uL — ABNORMAL LOW (ref 4.22–5.81)
RDW: 15.4 % (ref 11.5–15.5)
WBC: 5.4 10*3/uL (ref 4.0–10.5)
nRBC: 0 % (ref 0.0–0.2)

## 2019-07-29 LAB — LACTIC ACID, PLASMA: Lactic Acid, Venous: 1.8 mmol/L (ref 0.5–1.9)

## 2019-07-29 LAB — HEMOGLOBIN A1C
Hgb A1c MFr Bld: 6.1 % — ABNORMAL HIGH (ref 4.8–5.6)
Mean Plasma Glucose: 128.37 mg/dL

## 2019-07-29 LAB — POC SARS CORONAVIRUS 2 AG -  ED: SARS Coronavirus 2 Ag: NEGATIVE

## 2019-07-29 LAB — SARS CORONAVIRUS 2 (TAT 6-24 HRS): SARS Coronavirus 2: NEGATIVE

## 2019-07-29 MED ORDER — GABAPENTIN 100 MG PO CAPS
100.0000 mg | ORAL_CAPSULE | Freq: Two times a day (BID) | ORAL | Status: DC
  Administered 2019-07-29 – 2019-08-07 (×18): 100 mg via ORAL
  Filled 2019-07-29 (×18): qty 1

## 2019-07-29 MED ORDER — SODIUM CHLORIDE 0.9% FLUSH: 3.0000 mL | Freq: Once | INTRAVENOUS | Status: DC

## 2019-07-29 MED ORDER — OXYCODONE-ACETAMINOPHEN 5-325 MG PO TABS
1.0000 | ORAL_TABLET | Freq: Once | ORAL | Status: AC
  Administered 2019-07-29: 1 via ORAL
  Filled 2019-07-29: qty 1

## 2019-07-29 MED ORDER — SPIRONOLACTONE 25 MG PO TABS
50.0000 mg | ORAL_TABLET | Freq: Every day | ORAL | Status: DC
  Administered 2019-07-30 – 2019-08-07 (×9): 50 mg via ORAL
  Filled 2019-07-29 (×5): qty 2
  Filled 2019-07-29: qty 1
  Filled 2019-07-29 (×4): qty 2

## 2019-07-29 MED ORDER — FUROSEMIDE 10 MG/ML IJ SOLN
60.0000 mg | Freq: Two times a day (BID) | INTRAMUSCULAR | Status: DC
  Administered 2019-07-30 – 2019-08-07 (×17): 60 mg via INTRAVENOUS
  Filled 2019-07-29 (×18): qty 6

## 2019-07-29 MED ORDER — INSULIN ASPART 100 UNIT/ML ~~LOC~~ SOLN
0.0000 [IU] | Freq: Three times a day (TID) | SUBCUTANEOUS | Status: DC
  Administered 2019-07-30: 1 [IU] via SUBCUTANEOUS
  Administered 2019-07-30 – 2019-07-31 (×3): 2 [IU] via SUBCUTANEOUS
  Administered 2019-08-01: 1 [IU] via SUBCUTANEOUS
  Administered 2019-08-01 – 2019-08-02 (×3): 2 [IU] via SUBCUTANEOUS
  Administered 2019-08-03: 1 [IU] via SUBCUTANEOUS
  Administered 2019-08-03 – 2019-08-04 (×3): 2 [IU] via SUBCUTANEOUS
  Administered 2019-08-04: 3 [IU] via SUBCUTANEOUS
  Administered 2019-08-04: 1 [IU] via SUBCUTANEOUS
  Administered 2019-08-05: 2 [IU] via SUBCUTANEOUS
  Administered 2019-08-05: 1 [IU] via SUBCUTANEOUS
  Administered 2019-08-05 – 2019-08-06 (×2): 2 [IU] via SUBCUTANEOUS
  Administered 2019-08-06 (×2): 1 [IU] via SUBCUTANEOUS

## 2019-07-29 MED ORDER — ALBUTEROL SULFATE HFA 108 (90 BASE) MCG/ACT IN AERS
2.0000 | INHALATION_SPRAY | Freq: Four times a day (QID) | RESPIRATORY_TRACT | Status: DC | PRN
  Filled 2019-07-29: qty 6.7

## 2019-07-29 MED ORDER — FERROUS SULFATE 325 (65 FE) MG PO TABS
325.0000 mg | ORAL_TABLET | Freq: Every day | ORAL | Status: DC
  Administered 2019-07-30 – 2019-08-06 (×8): 325 mg via ORAL
  Filled 2019-07-29 (×8): qty 1

## 2019-07-29 MED ORDER — FUROSEMIDE 10 MG/ML IJ SOLN
60.0000 mg | Freq: Once | INTRAMUSCULAR | Status: AC
  Administered 2019-07-29: 60 mg via INTRAVENOUS
  Filled 2019-07-29: qty 6

## 2019-07-29 MED ORDER — ACETAMINOPHEN 325 MG PO TABS
650.0000 mg | ORAL_TABLET | Freq: Four times a day (QID) | ORAL | Status: DC | PRN
  Administered 2019-07-31 – 2019-08-02 (×2): 650 mg via ORAL
  Filled 2019-07-29 (×2): qty 2

## 2019-07-29 MED ORDER — GABAPENTIN 100 MG PO CAPS
200.0000 mg | ORAL_CAPSULE | Freq: Once | ORAL | Status: AC
  Administered 2019-07-29: 200 mg via ORAL
  Filled 2019-07-29: qty 2

## 2019-07-29 MED ORDER — POTASSIUM CHLORIDE 20 MEQ/15ML (10%) PO SOLN
40.0000 meq | Freq: Every day | ORAL | Status: DC
  Administered 2019-07-29 – 2019-08-03 (×6): 40 meq via ORAL
  Filled 2019-07-29 (×7): qty 30

## 2019-07-29 MED ORDER — SODIUM CHLORIDE 0.9% FLUSH
3.0000 mL | Freq: Two times a day (BID) | INTRAVENOUS | Status: DC
  Administered 2019-07-30 – 2019-08-07 (×17): 3 mL via INTRAVENOUS

## 2019-07-29 MED ORDER — DOXYCYCLINE HYCLATE 100 MG PO TABS
100.0000 mg | ORAL_TABLET | Freq: Once | ORAL | Status: AC
  Administered 2019-07-29: 100 mg via ORAL
  Filled 2019-07-29: qty 1

## 2019-07-29 MED ORDER — ACETAMINOPHEN 650 MG RE SUPP: 650.0000 mg | Freq: Four times a day (QID) | RECTAL | Status: DC | PRN

## 2019-07-29 MED ORDER — UMECLIDINIUM-VILANTEROL 62.5-25 MCG/INH IN AEPB
1.0000 | INHALATION_SPRAY | Freq: Every day | RESPIRATORY_TRACT | Status: DC
  Administered 2019-07-30 – 2019-08-07 (×9): 1 via RESPIRATORY_TRACT
  Filled 2019-07-29 (×2): qty 14

## 2019-07-29 NOTE — ED Triage Notes (Signed)
EMS gave 1000mg  tylenol PTA

## 2019-07-29 NOTE — ED Notes (Addendum)
Pt. Aware urine is needed. Pt. Attempted to provide urine but was unable to void. Urinal at bedside.

## 2019-07-29 NOTE — ED Triage Notes (Signed)
Pt from home with ems for c.o fever and bilateral leg swelling. EMS reports a fever as high as 103. Pt seen here 2 days ago for leg swelling, sent home to diurese. Hx of CHF. Pt a.o, pitting edema noted with redness.

## 2019-07-29 NOTE — ED Provider Notes (Signed)
[facility] EMERGENCY DEPARTMENT Provider Note   CSN: [id number] Arrival date & time: 07/29/19  1156     History   Chief Complaint Chief Complaint  Patient presents with   Fever   Leg Swelling    HPI John BALEY is a 73 y.o. male.     HPI Patient presents with fever and leg swelling.  Was seen in the ER 2 days ago for the leg swelling.  Presumed to be CHF.  Given Lasix.  Apparently discussed admission at that time but despite discharge home.  Has been taking increased Lasix.  Now developed fevers up to 103.  Some shortness of breath that he states is from the fluid.  States he also has pain on his legs and has difficulty walking due to it.  No nausea or vomiting.  No known sick contacts.  Feels worse when he lays back.  No dysuria. Past Medical History:  Diagnosis Date   Arthritis    "joints tighten up sometimes" (03/27/2104)   Asthma    CHF (congestive heart failure) (HCC)    Chronic low back pain    Chronic lower back pain    Coronary artery disease    a. 05/2012 Cath/PCI: LM 40ost, 50-60d, LAD 99p ruptured plaque (3.0x28 DES), LCX 50p/m, RCA 30-40p, 47m EF 65-70%.   Family history of adverse reaction to anesthesia    Pt granddaughter had PONV   GERD (gastroesophageal reflux disease)    Hepatic cirrhosis ([place]    a. Dx 01/2014 - CT a/p    History of blood transfusion    "related to bleeding ulcers"   History of concussion    1976--  NO RESIDUAL   History of GI bleed    a. UGIB 07/2012;  b. 01/2014 admission with GIB/FOB stool req 1U prbc's->EGD showed portal gastropathy, barrett's esoph, and chronic active h. pylori gastritis.   History of gout    2007 &  2008  LEFT LEG-- NO ISSUE SINCE   Hyperlipidemia    Iron deficiency anemia    Kidney stones    Lipoma    OA (osteoarthritis of spine)    LOWER BACK--  INTERMITTANT LEFT LEG NUMBNESS   OSA (obstructive sleep apnea)    PULMOLOGIST-  DR CLANCE--  MODERATE OSA  STARTED CPAP  2012--  BUT CURRENTLY HAS NOT USED PAST 6 MONTHS   Phimosis    a. s/p circumcision 2015.   Type 2 diabetes mellitus ([place]    Unspecified essential hypertension     Patient Active Problem List   Diagnosis Date Noted   Itching 03/22/2019   Skin lesion 03/22/2019   Hyponatremia 12/24/2018   (HFpEF) heart failure with preserved ejection fraction ([place] 12/24/2018   Precordial chest pain    Neck pain 09/12/2018   CKD (chronic kidney disease), stage III 12/18/2017   Symptomatic anemia 12/18/2017   Peptic ulcer disease 12/18/2017   GI bleed 09/29/2017   SOB (shortness of breath) 09/21/2017   Morbid obesity ([place] 02/05/2016   Numerous moles 09/15/2015   S/P CABG x 3 03/31/2014   Hepatic cirrhosis ([place] 02/27/2014   Acute blood loss anemia 08/13/2012   Hyperlipidemia with target LDL less than 70 06/29/2012   Thrombocyt[place] 06/29/2012   Coronary artery disease     OSA (obstructive sleep apnea)    Type 2 diabetes with complication ([place] 168/03/8109  GERD 08/04/2010   History of gout    Hypertensive heart disease     Past Surgical  History:  Procedure Laterality Date   ANKLE FRACTURE SURGERY Right 1989   "plate put in"   APPENDECTOMY  05-16-2004   open   CIRCUMCISION N/A 09/09/2013   Procedure: CIRCUMCISION ADULT;  Surgeon: Bernestine Amass, MD;  Location: [facility];  Service: Urology;  Laterality: N/A;   COLECTOMY  05-16-2004   CORONARY ANGIOPLASTY WITH STENT PLACEMENT  06/28/2012  DR COOPER   PCI W/  X1 DES to [place]. LAD/  LM  40% OSTIAL & 50-60% DISTAL /  50% PROX LCX/  30-40% PROX RCA & 50% MID RCA/   LVEF 65-70%   CORONARY ARTERY BYPASS GRAFT N/A 03/31/2014   Procedure: CORONARY ARTERY BYPASS GRAFTING (CABG) times 3 using left internal mammary artery and right saphenous vein.;  Surgeon: Melrose Nakayama, MD;  Location: [place];  Service: Open Heart Surgery;  Laterality: N/A;   DOBUTAMINE STRESS ECHO  06-08-2012   MODERATE  HYPOKINESIS/ ISCHEMIA MID INFERIOR WALL   ESOPHAGOGASTRODUODENOSCOPY  08/15/2012   Procedure: ESOPHAGOGASTRODUODENOSCOPY (EGD);  Surgeon: Wonda Horner, MD;  [facility] END[facility]COPY;  Service: Endoscopy;  Laterality: N/A;   ESOPHAGOGASTRODUODENOSCOPY N/A 02/17/2014   Procedure: ESOPHAGOGASTRODUODENOSCOPY (EGD);  Surgeon: Jeryl Columbia, MD;  [facility];  Service: Endoscopy;  Laterality: N/A;   ESOPHAGOGASTRODUODENOSCOPY (EGD) WITH PROPOFOL N/A 09/30/2017   Procedure: ESOPHAGOGASTRODUODENOSCOPY (EGD) WITH PROPOFOL;  Surgeon: Wonda Horner, MD;  [facility];  Service: Endoscopy;  Laterality: N/A;   ESOPHAGOGASTRODUODENOSCOPY (EGD) WITH PROPOFOL N/A 12/20/2017   Procedure: ESOPHAGOGASTRODUODENOSCOPY (EGD) WITH PROPOFOL;  Surgeon: Clarene Essex, MD;  Location: [place];  Service: Endoscopy;  Laterality: N/A;   HERNIA REPAIR     INTRAOPERATIVE TRANSESOPHAGEAL ECHOCARDIOGRAM N/A 03/31/2014   Procedure: INTRAOPERATIVE TRANSESOPHAGEAL ECHOCARDIOGRAM;  Surgeon: Melrose Nakayama, MD;  Location: [place];  Service: Open Heart Surgery;  Laterality: N/A;   LAPAROSCOPIC UMBILICAL HERNIA REPAIR W/ MESH  06-06-2011   LEFT HEART CATHETERIZATION WITH CORONARY ANGIOGRAM N/A 03/28/2014   Procedure: LEFT HEART CATHETERIZATION WITH CORONARY ANGIOGRAM;  Surgeon: Sinclair Grooms, MD;  Location: [facility] CATH LAB;  Service: Cardiovascular;  Laterality: N/A;   LIPOMA EXCISION Left 08/25/2017   Procedure: EXCISION LIPOMA LEFT POSTERIOR THIGH;  Surgeon: Clovis Riley, MD;  Location: [place];  Service: General;  Laterality: Left;   NEPHROLITHOTOMY  1990'S   OPEN APPENDECTOMY W/ PARTIAL CECECTOMY  05-16-2004   PERCUTANEOUS CORONARY STENT INTERVENTION (PCI-S) N/A 06/28/2012   Procedure: PERCUTANEOUS CORONARY STENT INTERVENTION (PCI-S);  Surgeon: Sherren Mocha, MD;  Location[facility] C[facility] LAB;  Service: Cardiovascular;  Laterality: N/A;        Home Medications    Prior to Admission medications     Medication Sig Start Date End Date Taking? Authorizing Provider  acetaminophen (TYLENOL) 325 MG tablet Take 2 tablets (650 mg total) by mouth every 6 (six) hours as needed. Patient not taking: Reported on 07/27/2019 05/25/18   Varney Biles, MD  albuterol (VENTOLIN HFA) 108 (90 Base) MCG/ACT inhaler Inhale 2 puffs into the lungs every 6 (six) hours as needed for wheezing or shortness of breath.    [provider]  blood glucose meter kit and supplies Dispense based on patient and insurance preference. Use up to four times daily as directed. (FOR ICD-10 E10.9, E11.9). 05/02/19   [name], MD  famotidine (PEPCID) 20 MG tablet Take 1 tablet (20 mg total) by mouth 2 (two) times daily as needed (itching). Patient not taking: Reported on 07/27/2019 03/11/19   Renita Papa,  PA-C  ferrous sulfate 325 (65 FE) MG EC tablet Take 1 tablet (325 mg total) by mouth daily with breakfast. Patient taking differently: Take 325 mg by mouth daily with supper.  04/20/18   [name], MD  furosemide (LASIX) 20 MG tablet Take 3 tablets (60 mg total) by mouth 2 (two) times daily for 3 days. 07/27/19 07/30/19  Carmin Muskrat, MD  gabapentin (NEURONTIN) 100 MG capsule TAKE 1 CAPSULE(100 MG) BY MOUTH TWICE DAILY Patient taking differently: Take 100 mg by mouth 2 (two) times daily.  05/14/19   [name], MD  glimepiride (AMARYL) 2 MG tablet Take 1 tablet (2 mg total) by mouth daily before breakfast. Patient not taking: Reported on 07/27/2019 05/02/19   [name], MD  hydrOXYzine (ATARAX/VISTARIL) 10 MG tablet Take 1 tablet (10 mg total) by mouth every 6 (six) hours as needed for itching. Patient not taking: Reported on 07/27/2019 03/22/19   Biagio Borg, MD  metFORMIN (GLUCOPHAGE) 850 MG tablet Take 1 tablet (850 mg total) by mouth 2 (two) times daily with a meal. 05/30/19   [name], MD  nitroGLYCERIN (NITROSTAT) 0.4 MG SL tablet Place 1 tablet (0.4 mg total)  under the tongue every 5 (five) minutes as needed for chest pain. 06/08/16   Nahser, Wonda Cheng, MD  polyethylene glycol [facility] / Floria Raveling) packet Take 17 g by mouth 2 (two) times daily. Patient not taking: Reported on 07/27/2019 10/05/17   Roxan Hockey, MD  spironolactone (ALDACTONE) 50 MG tablet Take 1 tablet (50 mg total) by mouth daily. 07/16/19   [name], MD  umeclidinium-vilanterol (ANORO ELLIPTA) 62.5-25 MCG/INH AEPB Inhale 1 puff into the lungs daily. 02/05/19   [name], MD    Family History Family History  Problem Relation Age of Onset   Lung cancer Sister    Cancer Sister        lung   Cancer Mother    Cancer Father        died in his 40s.   Cancer Brother        lung   Coronary artery disease Other    Diabetes Other    Colon cancer Other    Cancer Sister        lung    Social History Social History   Tobacco Use   Smoking status: Former Smoker    Packs/day: 2.00    Years: 40.00    Pack years: 80.00    Types: Cigarettes    Quit date: 08/29/1996    Years since quitting: 22.9   Smokeless tobacco: Never Used  Substance Use Topics   Alcohol use: Yes    Alcohol/week: 0.0 standard drinks    Comment: occasional   Drug use: No     Allergies   Patient has no known allergies.   Review of Systems Review of Systems  HENT: Negative for congestion.   Respiratory: Positive for cough and shortness of breath.   Cardiovascular: Positive for chest pain and leg swelling.  Gastrointestinal: Negative for abdominal pain.  Genitourinary: Negative for flank pain.  Musculoskeletal: Negative for back pain.  Skin: Negative for rash.  Neurological: Negative for weakness.  Psychiatric/Behavioral: Negative for confusion.     Physical Exam Updated Vital Signs BP (!) 158/67 (BP Location: Right Arm)    Pulse 81    Temp 99.6 F (37.6 C) (Oral)    Resp (!) 22    Ht '5\' 7"'  (1.702 m)    Wt  104.3 kg    SpO2 95%    BMI 36.02 kg/m   Physical  Exam Vitals signs and nursing note reviewed.  HENT:     Head: Normocephalic.     Mouth/Throat:     Mouth: Mucous membranes are moist.  Cardiovascular:     Rate and Rhythm: Normal rate and regular rhythm.  Pulmonary:     Comments: Wheezes bilateral bases. Abdominal:     Tenderness: There is no abdominal tenderness.  Musculoskeletal:     Right lower leg: Edema present.     Left lower leg: Edema present.     Comments: Chronic edema skin changes bilateral lower extremities.  No real warmth or erythema.  Skin:    Capillary Refill: Capillary refill takes less than 2 seconds.  Neurological:     Mental Status: He is alert and oriented to person, place, and time. Mental status is at baseline.      ED Treatments / Results  Labs (all labs ordered are listed, but only abnormal results are displayed) Labs Reviewed  COMPREHENSIVE METABOLIC PANEL - Abnormal; Notable for the following components:      Result Value   Glucose, Bld 177 (*)    Calcium 8.4 (*)    Albumin 3.4 (*)    GFR calc non Af Amer 58 (*)    All other components within normal limits  CBC WITH DIFFERENTIAL/PLATELET - Abnormal; Notable for the following components:   RBC 3.48 (*)    Hemoglobin 9.0 (*)    HCT 30.0 (*)    MCH 25.9 (*)    Platelets 135 (*)    Lymphs Abs 0.6 (*)    All other components within normal limits  LACTIC ACID, PLASMA  LACTIC ACID, PLASMA  URINALYSIS, ROUTINE W REFLEX MICROSCOPIC  POC SARS CORONAVIRUS 2 AG -  ED    EKG None  Radiology Dg Chest Portable 1 View  Result Date: 07/29/2019 CLINICAL DATA:  Shortness of breath and fever EXAM: PORTABLE CHEST 1 VIEW COMPARISON:  July 27, 2019. FINDINGS: There are areas of mild scarring bilaterally. No frank edema or consolidation. Heart is mildly enlarged with pulmonary vascularity within normal limits. There is aortic atherosclerosis. Patient is status post coronary artery bypass grafting. No adenopathy. No bone lesions. IMPRESSION: Scattered  areas of lung scarring bilaterally. No frank edema or consolidation. Heart is mildly enlarged with pulmonary vascularity normal. Postoperative changes noted. No adenopathy. Aortic Atherosclerosis (ICD10-I70.0). Electronically Signed   By: Lowella Grip III M.D.   On: 07/29/2019 14:36    Procedures Procedures (including critical care time)  Medications Ordered in ED Medications  sodium chloride flush (NS) 0.9 % injection 3 mL (has no administration in time range)     Initial Impression / Assessment and Plan / ED Course  I have reviewed the triage vital signs and the nursing notes.  Pertinent labs & imaging results that were available during my care of the patient were reviewed by me and considered in my medical decision making (see chart for details).        Patient presents with fever and some shortness of breath.  Seen 2 days ago diagnosed with CHF.  Has had Lasix.  States the breathing is not improved.  However states now fevers up to 103.  States his legs are painful.  Some redness on the legs but not clearly cellulitis.  Covid test pending.  However with fever up to 103 and the leg swelling with fever patient may benefit from Ava to  the hospital.  Care turned over to oncoming provider, Dr. [name]  Final Clinical Impressions(s) / ED Diagnoses   Final diagnoses:  Congestive heart failure, unspecified HF chronicity, unspecified heart failure type ([place])  Fever, unspecified fever cause    ED Discharge Orders    None       Davonna Belling, MD 07/29/19 1541

## 2019-07-29 NOTE — H&P (Signed)
History and Physical    [name] PVV:[id number] DOB: 1946/03/24 DOA: 07/29/2019  PCP: [name], MD  Patient coming from: Home  I have personally briefly reviewed patient's old medical records in [place]  Chief Complaint: Volume overload  HPI: [name] is a 73 y.o. male with medical history significant for HFpEF (EF 60-65%), CAD s/p DES and CABG, hepatic cirrhosis, CKD stage III, type 2 diabetes, hyperlipidemia, asthma, anemia of chronic disease, and untreated OSA who presents to the ED for evaluation of volume overload.  Patient was seen in the ED on 07/27/2019 for evaluation of dyspnea, fatigue, weight gain, and swelling in both lower extremities.  Portable chest x-ray showed moderate pulmonary edema.  [name] was given 100 mg IV Lasix in the ED and per documentation ED physician discussed admission versus discharge to home with close outpatient follow-up and patient opted for outpatient follow-up.  [name] was advised to increase his home Lasix to 60 mg twice daily for 3 days with strict return precautions.  Patient reports continued progressive swelling in both of his legs.  [name] has been having increased pain and difficulty walking now.  [name] denies any change in his dyspnea.  [name] denies any chest pain, palpitations, cough, abdominal pain, dysuria.  ED Course:  Initial vitals show BP 138/67, pulse 81, RR 22, temp 99.6 Fahrenheit, SPO2 95% on room air.  Labs are notable for WBC 5.4, hemoglobin 9.0, platelets 135,000, sodium 137, potassium 3.5, bicarb 25, BUN 17, creatinine 1.23, GFR 58, lactic acid 1.8.  Urinalysis negative for UTI.  SARS-CoV-2 antigen test is negative.  SARS-CoV-2 PCR test was obtained and pending.  Portable chest x-ray shows prior CABG changes with mild bilateral scarring, no significant pulmonary edema or effusion.  Patient was given IV Lasix 60 mg once for diuresis and oral doxycycline due to concern for lower extremity cellulitis.  Lower extremity  Dopplers were ordered and pending.  The hospitalist service was consulted admit for further evaluation and management.  Review of Systems: All systems reviewed and are negative except as documented in history of present illness above.   Past Medical History:  Diagnosis Date  . Arthritis    "joints tighten up sometimes" (03/27/2104)  . Asthma   . CHF (congestive heart failure) ([place])   . Chronic low back pain   . Chronic lower back pain   . Coronary artery disease    a. 05/2012 Cath/PCI: LM 40ost, 50-60d, LAD 99p ruptured plaque (3.0x28 DES), LCX 50p/m, RCA 30-40p, 62m EF 65-70%.  . Family history of adverse reaction to anesthesia    Pt granddaughter had PONV  . GERD (gastroesophageal reflux disease)   . Hepatic [place]    a. Dx 01/2014 - CT a/p   . History of blood transfusion    "related to bleeding ulcers"  . History of concussion    1976--  NO RESIDUAL  . History of GI bleed    a. UGIB 07/2012;  b. 01/2014 admission with GIB/FOB stool req 1U prbc's->EGD showed portal gastropathy, barrett's esoph, and chronic active h. pylori gastritis.  .Marland KitchenHistory of gout    2007 &  2008  LEFT LEG-- NO ISSUE SINCE  . Hyperlipidemia   . Iron deficiency anemia   . Kidney stones   . Lipoma   . OA (osteoarthritis of spine)    LOWER BACK--  INTERMITTANT LEFT LEG NUMBNESS  . OSA (obstructive sleep apnea)    PULMOLOGIST-  DR CLANCE--  MODERATE OSA  STARTED CPAP 2012--  BUT CURRENTLY HAS NOT USED PAST 6 MONTHS  . Phimosis    a. s/p circumcision 2015.  . Type 2 diabetes mellitus ([place])   . Unspecified essential hypertension     Past Surgical History:  Procedure Laterality Date  . ANKLE FRACTURE SURGERY Right 1989   "plate put in"  . APPENDECTOMY  05-16-2004   open  . CIRCUMCISION N/A 09/09/2013   Procedure: CIRCUMCISION ADULT;  Surgeon: Bernestine Amass, MD;  Location: [facility];  Service: Urology;  Laterality: N/A;  . COLECTOMY  05-16-2004  . CORONARY ANGIOPLASTY WITH  STENT PLACEMENT  06/28/2012  DR COOPER   PCI W/  X1 DES to [place]. LAD/  LM  40% OSTIAL & 50-60% DISTAL /  50% PROX LCX/  30-40% PROX RCA & 50% MID RCA/   LVEF 65-70%  . CORONARY ARTERY BYPASS GRAFT N/A 03/31/2014   Procedure: CORONARY ARTERY BYPASS GRAFTING (CABG) times 3 using left internal mammary artery and right saphenous vein.;  Surgeon: Melrose Nakayama, MD;  Location: [place];  Service: Open Heart Surgery;  Laterality: N/A;  . DOBUTAMINE STRESS ECHO  06-08-2012   MODERATE HYPOKINESIS/ ISCHEMIA MID INFERIOR WALL  . ESOPHAGOGASTRODUODENOSCOPY  08/15/2012   Procedure: ESOPHAGOGASTRODUODENOSCOPY (EGD);  Surgeon: Wonda Horner, MD;  Location: [facility] ENDOSCOPY;  Service: Endoscopy;  Laterality: N/A;  . ESOPHAGOGASTRODUODENOSCOPY N/A 02/17/2014   Procedure: ESOPHAGOGASTRODUODENOSCOPY (EGD);  Surgeon: Jeryl Columbia, MD;  Location: [facility] ENDOSCOPY;  Service: Endoscopy;  Laterality: N/A;  . ESOPHAGOGASTRODUODENOSCOPY (EGD) WITH PROPOFOL N/A 09/30/2017   Procedure: ESOPHAGOGASTRODUODENOSCOPY (EGD) WITH PROPOFOL;  Surgeon: Wonda Horner, MD;  Location: [facility] ENDOSCOPY;  Service: Endoscopy;  Laterality: N/A;  . ESOPHAGOGASTRODUODENOSCOPY (EGD) WITH PROPOFOL N/A 12/20/2017   Procedure: ESOPHAGOGASTRODUODENOSCOPY (EGD) WITH PROPOFOL;  Surgeon: Clarene Essex, MD;  Location: [place];  Service: Endoscopy;  Laterality: N/A;  . HERNIA REPAIR    . INTRAOPERATIVE TRANSESOPHAGEAL ECHOCARDIOGRAM N/A 03/31/2014   Procedure: INTRAOPERATIVE TRANSESOPHAGEAL ECHOCARDIOGRAM;  Surgeon: Melrose Nakayama, MD;  Location: [place];  Service: Open Heart Surgery;  Laterality: N/A;  . LAPAROSCOPIC UMBILICAL HERNIA REPAIR W/ MESH  06-06-2011  . LEFT HEART CATHETERIZATION WITH CORONARY ANGIOGRAM N/A 03/28/2014   Procedure: LEFT HEART CATHETERIZATION WITH CORONARY ANGIOGRAM;  Surgeon: Sinclair Grooms, MD;  Location: [facility] CATH LAB;  Service: Cardiovascular;  Laterality: N/A;  . LIPOMA EXCISION Left 08/25/2017   Procedure: EXCISION LIPOMA LEFT  POSTERIOR THIGH;  Surgeon: Clovis Riley, MD;  Location: [place];  Service: General;  Laterality: Left;  . NEPHROLITHOTOMY  1990'S  . OPEN APPENDECTOMY W/ PARTIAL CECECTOMY  05-16-2004  . PERCUTANEOUS CORONARY STENT INTERVENTION (PCI-S) N/A 06/28/2012   Procedure: PERCUTANEOUS CORONARY STENT INTERVENTION (PCI-S);  Surgeon: Sherren Mocha, MD;  Location: [facility] CATH LAB;  Service: Cardiovascular;  Laterality: N/A;    Social History:  reports that [name] quit smoking about 22 years ago. His smoking use included cigarettes. [name] has a 80.00 pack-year smoking history. [name] has never used smokeless tobacco. [name] reports current alcohol use. [name] reports that [name] does not use drugs.  No Known Allergies  Family History  Problem Relation Age of Onset  . Lung cancer Sister   . Cancer Sister        lung  . Cancer Mother   . Cancer Father        died in his 84s.  . Cancer Brother        lung  . Coronary artery disease Other   .  Diabetes Other   . Colon cancer Other   . Cancer Sister        lung     Prior to Admission medications   Medication Sig Start Date End Date Taking? Authorizing Provider  acetaminophen (TYLENOL) 325 MG tablet Take 2 tablets (650 mg total) by mouth every 6 (six) hours as needed. Patient not taking: Reported on 07/27/2019 05/25/18   Varney Biles, MD  albuterol (VENTOLIN HFA) 108 (90 Base) MCG/ACT inhaler Inhale 2 puffs into the lungs every 6 (six) hours as needed for wheezing or shortness of breath.    [provider]  blood glucose meter kit and supplies Dispense based on patient and insurance preference. Use up to four times daily as directed. (FOR ICD-10 E10.9, E11.9). 05/02/19   [name], MD  famotidine (PEPCID) 20 MG tablet Take 1 tablet (20 mg total) by mouth 2 (two) times daily as needed (itching). Patient not taking: Reported on 07/27/2019 03/11/19   Rodell Perna A, PA-C  ferrous sulfate 325 (65 FE) MG EC tablet Take 1 tablet (325 mg total) by mouth daily  with breakfast. Patient taking differently: Take 325 mg by mouth daily with supper.  04/20/18   [name], MD  furosemide (LASIX) 20 MG tablet Take 3 tablets (60 mg total) by mouth 2 (two) times daily for 3 days. 07/27/19 07/30/19  Carmin Muskrat, MD  gabapentin (NEURONTIN) 100 MG capsule TAKE 1 CAPSULE(100 MG) BY MOUTH TWICE DAILY Patient taking differently: Take 100 mg by mouth 2 (two) times daily.  05/14/19   [name], MD  glimepiride (AMARYL) 2 MG tablet Take 1 tablet (2 mg total) by mouth daily before breakfast. Patient not taking: Reported on 07/27/2019 05/02/19   [name], MD  hydrOXYzine (ATARAX/VISTARIL) 10 MG tablet Take 1 tablet (10 mg total) by mouth every 6 (six) hours as needed for itching. Patient not taking: Reported on 07/27/2019 03/22/19   Biagio Borg, MD  metFORMIN (GLUCOPHAGE) 850 MG tablet Take 1 tablet (850 mg total) by mouth 2 (two) times daily with a meal. 05/30/19   [name], MD  nitroGLYCERIN (NITROSTAT) 0.4 MG SL tablet Place 1 tablet (0.4 mg total) under the tongue every 5 (five) minutes as needed for chest pain. 06/08/16   Nahser, Wonda Cheng, MD  polyethylene glycol [facility] / Floria Raveling) packet Take 17 g by mouth 2 (two) times daily. Patient not taking: Reported on 07/27/2019 10/05/17   Roxan Hockey, MD  spironolactone (ALDACTONE) 50 MG tablet Take 1 tablet (50 mg total) by mouth daily. 07/16/19   [name], MD  umeclidinium-vilanterol (ANORO ELLIPTA) 62.5-25 MCG/INH AEPB Inhale 1 puff into the lungs daily. 02/05/19   [name], MD    Physical Exam: Vitals:   07/29/19 1204 07/29/19 1205  BP: (!) 158/67   Pulse: 81   Resp: (!) 22   Temp: 99.6 F (37.6 C)   TempSrc: Oral   SpO2: 95%   Weight:  104.3 kg  Height:  '5\' 7"'  (1.702 m)    Constitutional: Obese man resting in bed with head elevated, NAD, calm, comfortable Eyes: PERRL, lids and conjunctivae normal ENMT: Mucous membranes are moist.  Posterior pharynx clear of any exudate or lesions.Normal dentition.  Neck: normal, supple, no masses. Respiratory: Bibasilar inspiratory crackles with end expiratory wheezing bilaterally.  Normal respiratory effort. No accessory muscle use.  Cardiovascular: Regular rate and rhythm, no murmurs / rubs / gallops.  +2 pitting edema bilateral lower extremities.  Abdomen: no tenderness, no masses palpated. No hepatosplenomegaly. Bowel sounds positive.  Musculoskeletal: no clubbing / cyanosis. No joint deformity upper and lower extremities. Good ROM, no contractures. Normal muscle tone.  Skin: Venous stasis dermatitis of bilateral lower extremities.  No induration Neurologic: CN 2-12 grossly intact. Sensation intact, Strength 5/5 in all 4.  Psychiatric: Normal judgment and insight. Alert and oriented x 3. Normal mood.     Labs on Admission: I have personally reviewed following labs and imaging studies  CBC: Recent Labs  Lab 07/27/19 1257 07/29/19 1218  WBC 4.0 5.4  NEUTROABS 3.0 4.3  HGB 8.9* 9.0*  HCT 30.6* 30.0*  MCV 88.2 86.2  PLT 124* 158*   Basic Metabolic Panel: Recent Labs  Lab 07/27/19 1257 07/29/19 1218  NA 137 137  K 3.7 3.5  CL 105 101  CO2 23 25  GLUCOSE 216* 177*  BUN 20 17  CREATININE 1.25* 1.23  CALCIUM 8.6* 8.4*   GFR: Estimated Creatinine Clearance: 61.6 mL/min (by C-G formula based on SCr of 1.23 mg/dL). Liver Function Tests: Recent Labs  Lab 07/27/19 1257 07/29/19 1218  AST 28 25  ALT 20 20  ALKPHOS 107 96  BILITOT 0.9 0.6  PROT 6.6 7.1  ALBUMIN 3.4* 3.4*   No results for input(s): LIPASE, AMYLASE in the last 168 hours. No results for input(s): AMMONIA in the last 168 hours. Coagulation Profile: No results for input(s): INR, PROTIME in the last 168 hours. Cardiac Enzymes: No results for input(s): CKTOTAL, CKMB, CKMBINDEX, TROPONINI in the last 168 hours. BNP (last 3 results) No results for input(s): PROBNP in the last 8760 hours. HbA1C: No  results for input(s): HGBA1C in the last 72 hours. CBG: No results for input(s): GLUCAP in the last 168 hours. Lipid Profile: No results for input(s): CHOL, HDL, LDLCALC, TRIG, CHOLHDL, LDLDIRECT in the last 72 hours. Thyroid Function Tests: No results for input(s): TSH, T4TOTAL, FREET4, T3FREE, THYROIDAB in the last 72 hours. Anemia Panel: No results for input(s): VITAMINB12, FOLATE, FERRITIN, TIBC, IRON, RETICCTPCT in the last 72 hours. Urine analysis:    Component Value Date/Time   COLORURINE YELLOW 07/29/2019 1849   APPEARANCEUR CLEAR 07/29/2019 1849   LABSPEC 1.010 07/29/2019 1849   PHURINE 6.0 07/29/2019 1849   GLUCOSEU NEGATIVE 07/29/2019 1849   HGBUR NEGATIVE 07/29/2019 [place] 07/29/2019 [place] 07/29/2019 [place] 07/29/2019 1849   UROBILINOGEN 0.2 03/29/2014 1546   NITRITE NEGATIVE 07/29/2019 1849   LEUKOCYTESUR NEGATIVE 07/29/2019 1849    Radiological Exams on Admission: Dg Chest Portable 1 View  Result Date: 07/29/2019 CLINICAL DATA:  Shortness of breath and fever EXAM: PORTABLE CHEST 1 VIEW COMPARISON:  July 27, 2019. FINDINGS: There are areas of mild scarring bilaterally. No frank edema or consolidation. Heart is mildly enlarged with pulmonary vascularity within normal limits. There is aortic atherosclerosis. Patient is status post coronary artery bypass grafting. No adenopathy. No bone lesions. IMPRESSION: Scattered areas of lung scarring bilaterally. No frank edema or consolidation. Heart is mildly enlarged with pulmonary vascularity normal. Postoperative changes noted. No adenopathy. Aortic Atherosclerosis (ICD10-I70.0). Electronically Signed   By: Lowella Grip III M.D.   On: 07/29/2019 14:36    EKG: Independently reviewed. 07/27/2019: Sinus rhythm, RAD, low voltage precordial leads.  Not significantly changed when compared to prior.  Assessment/Plan Principal Problem:   Volume overload Active  Problems:   Type 2 diabetes with complication [facility])   Coronary artery disease  Hyperlipidemia with target LDL less than 70   Hepatic cirrhosis (HCC)   S/P CABG x 3   CKD (chronic kidney disease), stage III   (HFpEF) heart failure with preserved ejection fraction (HCC)  [name] is a 73 y.o. male with medical history significant for HFpEF (EF 60-65%), CAD s/p DES and CABG, hepatic cirrhosis, CKD stage III, type 2 diabetes, hyperlipidemia, asthma, anemia of chronic disease, and untreated OSA who is admitted with volume overload in setting of HFpEF.  Volume overload with bilateral lower extremity edema in setting of heart failure with preserved ejection fraction: Remains volume overloaded with inspiratory crackles on lung examination.  Lower extremity skin changes consistent with venous stasis dermatitis, doubt cellulitis or DVT therefore will discontinue antibiotics and lower extremity Doppler studies. -Continue IV Lasix 60 mg twice daily -Obtain echocardiogram -Monitor strict I/O's, daily weights -PT/OT eval  CAD s/p CABG and DES: Chronic and stable, denies any chest pain.  [name] is not currently on antiplatelet therapy due to history of upper GI bleed.  Hepatic cirrhosis: Chronic without evidence of acute decompensation or encephalopathy.  Continue spironolactone plus IV Lasix as above.  Type 2 diabetes: On Metformin as an outpatient.  Continue with sensitive SSI while in hospital.  Anemia of chronic disease History of upper GI bleed: Chronic and stable without obvious bleeding.  Continue to monitor.  Hypertension: Resume home spironolactone, continue Lasix as above.  Hyperlipidemia: Per prior documentation [name] has been off of statin due to hepatic disease.  CKD stage III: Chronic and stable.  Continue monitor.  Asthma: Has some wheezing on admission.  Continue home Anoro Ellipta and as needed albuterol.  OSA: Has been untreated for several years.  Continue nocturnal  supplemental O2 via [place].  DVT prophylaxis: SCDs Code Status: Full code, confirmed with Family Communication: Discussed with patient, [name] has discussed with his family Disposition Plan: Likely discharge to home pending clinical progress Consults called: None Admission status: Observation   Zada Finders MD [facility]  If 7PM-7AM, please contact night-coverage [email or web address]  07/29/2019, 8:17 PM

## 2019-07-30 ENCOUNTER — Observation Stay (HOSPITAL_BASED_OUTPATIENT_CLINIC_OR_DEPARTMENT_OTHER): Payer: PPO

## 2019-07-30 ENCOUNTER — Inpatient Hospital Stay (HOSPITAL_COMMUNITY): Payer: PPO

## 2019-07-30 DIAGNOSIS — I509 Heart failure, unspecified: Secondary | ICD-10-CM

## 2019-07-30 DIAGNOSIS — Z8249 Family history of ischemic heart disease and other diseases of the circulatory system: Secondary | ICD-10-CM | POA: Diagnosis not present

## 2019-07-30 DIAGNOSIS — K7460 Unspecified cirrhosis of liver: Secondary | ICD-10-CM | POA: Diagnosis not present

## 2019-07-30 DIAGNOSIS — R609 Edema, unspecified: Secondary | ICD-10-CM

## 2019-07-30 DIAGNOSIS — E8770 Fluid overload, unspecified: Secondary | ICD-10-CM | POA: Diagnosis not present

## 2019-07-30 DIAGNOSIS — D631 Anemia in chronic kidney disease: Secondary | ICD-10-CM | POA: Diagnosis not present

## 2019-07-30 DIAGNOSIS — Z800 Family history of malignant neoplasm of digestive organs: Secondary | ICD-10-CM | POA: Diagnosis not present

## 2019-07-30 DIAGNOSIS — I130 Hypertensive heart and chronic kidney disease with heart failure and stage 1 through stage 4 chronic kidney disease, or unspecified chronic kidney disease: Secondary | ICD-10-CM | POA: Diagnosis not present

## 2019-07-30 DIAGNOSIS — I25810 Atherosclerosis of coronary artery bypass graft(s) without angina pectoris: Secondary | ICD-10-CM

## 2019-07-30 DIAGNOSIS — Z951 Presence of aortocoronary bypass graft: Secondary | ICD-10-CM | POA: Diagnosis not present

## 2019-07-30 DIAGNOSIS — Z87891 Personal history of nicotine dependence: Secondary | ICD-10-CM | POA: Diagnosis not present

## 2019-07-30 DIAGNOSIS — E118 Type 2 diabetes mellitus with unspecified complications: Secondary | ICD-10-CM

## 2019-07-30 DIAGNOSIS — Z7984 Long term (current) use of oral hypoglycemic drugs: Secondary | ICD-10-CM | POA: Diagnosis not present

## 2019-07-30 DIAGNOSIS — Z955 Presence of coronary angioplasty implant and graft: Secondary | ICD-10-CM | POA: Diagnosis not present

## 2019-07-30 DIAGNOSIS — E1122 Type 2 diabetes mellitus with diabetic chronic kidney disease: Secondary | ICD-10-CM | POA: Diagnosis not present

## 2019-07-30 DIAGNOSIS — Z87442 Personal history of urinary calculi: Secondary | ICD-10-CM | POA: Diagnosis not present

## 2019-07-30 DIAGNOSIS — K219 Gastro-esophageal reflux disease without esophagitis: Secondary | ICD-10-CM | POA: Diagnosis not present

## 2019-07-30 DIAGNOSIS — I2510 Atherosclerotic heart disease of native coronary artery without angina pectoris: Secondary | ICD-10-CM | POA: Diagnosis not present

## 2019-07-30 DIAGNOSIS — G4733 Obstructive sleep apnea (adult) (pediatric): Secondary | ICD-10-CM | POA: Diagnosis not present

## 2019-07-30 DIAGNOSIS — Z801 Family history of malignant neoplasm of trachea, bronchus and lung: Secondary | ICD-10-CM | POA: Diagnosis not present

## 2019-07-30 DIAGNOSIS — M109 Gout, unspecified: Secondary | ICD-10-CM | POA: Diagnosis not present

## 2019-07-30 DIAGNOSIS — G8929 Other chronic pain: Secondary | ICD-10-CM | POA: Diagnosis not present

## 2019-07-30 DIAGNOSIS — E785 Hyperlipidemia, unspecified: Secondary | ICD-10-CM | POA: Diagnosis not present

## 2019-07-30 DIAGNOSIS — Z20828 Contact with and (suspected) exposure to other viral communicable diseases: Secondary | ICD-10-CM | POA: Diagnosis not present

## 2019-07-30 DIAGNOSIS — R509 Fever, unspecified: Secondary | ICD-10-CM | POA: Diagnosis not present

## 2019-07-30 DIAGNOSIS — M545 Low back pain: Secondary | ICD-10-CM | POA: Diagnosis not present

## 2019-07-30 DIAGNOSIS — N1831 Chronic kidney disease, stage 3a: Secondary | ICD-10-CM | POA: Diagnosis not present

## 2019-07-30 DIAGNOSIS — Z833 Family history of diabetes mellitus: Secondary | ICD-10-CM | POA: Diagnosis not present

## 2019-07-30 DIAGNOSIS — I5033 Acute on chronic diastolic (congestive) heart failure: Secondary | ICD-10-CM

## 2019-07-30 DIAGNOSIS — J45909 Unspecified asthma, uncomplicated: Secondary | ICD-10-CM | POA: Diagnosis not present

## 2019-07-30 LAB — GLUCOSE, CAPILLARY
Glucose-Capillary: 111 mg/dL — ABNORMAL HIGH (ref 70–99)
Glucose-Capillary: 117 mg/dL — ABNORMAL HIGH (ref 70–99)
Glucose-Capillary: 139 mg/dL — ABNORMAL HIGH (ref 70–99)
Glucose-Capillary: 173 mg/dL — ABNORMAL HIGH (ref 70–99)

## 2019-07-30 LAB — BASIC METABOLIC PANEL
Anion gap: 13 (ref 5–15)
BUN: 18 mg/dL (ref 8–23)
CO2: 26 mmol/L (ref 22–32)
Calcium: 8.4 mg/dL — ABNORMAL LOW (ref 8.9–10.3)
Chloride: 100 mmol/L (ref 98–111)
Creatinine, Ser: 1.21 mg/dL (ref 0.61–1.24)
GFR calc Af Amer: >60 mL/min (ref >60)
GFR calc non Af Amer: 59 mL/min — ABNORMAL LOW (ref >60)
Glucose, Bld: 80 mg/dL (ref 70–99)
Potassium: 3.5 mmol/L (ref 3.5–5.1)
Sodium: 139 mmol/L (ref 135–145)

## 2019-07-30 LAB — CBC
HCT: 31.2 % — ABNORMAL LOW (ref 39.0–52.0)
Hemoglobin: 9.4 g/dL — ABNORMAL LOW (ref 13.0–17.0)
MCH: 25.8 pg — ABNORMAL LOW (ref 26.0–34.0)
MCHC: 30.1 g/dL (ref 30.0–36.0)
MCV: 85.7 fL (ref 80.0–100.0)
Platelets: 142 10*3/uL — ABNORMAL LOW (ref 150–400)
RBC: 3.64 MIL/uL — ABNORMAL LOW (ref 4.22–5.81)
RDW: 15.3 % (ref 11.5–15.5)
WBC: 5.5 10*3/uL (ref 4.0–10.5)
nRBC: 0 % (ref 0.0–0.2)

## 2019-07-30 LAB — MAGNESIUM: Magnesium: 1.9 mg/dL (ref 1.7–2.4)

## 2019-07-30 LAB — ECHOCARDIOGRAM COMPLETE
Height: 67 in
Weight: 3680 oz

## 2019-07-30 MED ORDER — OXYCODONE-ACETAMINOPHEN 5-325 MG PO TABS
1.0000 | ORAL_TABLET | Freq: Four times a day (QID) | ORAL | Status: DC | PRN
  Administered 2019-07-30 – 2019-08-06 (×13): 1 via ORAL
  Filled 2019-07-30 (×13): qty 1

## 2019-07-30 MED ORDER — ENOXAPARIN SODIUM 40 MG/0.4ML ~~LOC~~ SOLN
40.0000 mg | SUBCUTANEOUS | Status: DC
  Administered 2019-07-30 – 2019-08-06 (×8): 40 mg via SUBCUTANEOUS
  Filled 2019-07-30 (×8): qty 0.4

## 2019-07-30 NOTE — ED Notes (Signed)
Tele

## 2019-07-30 NOTE — ED Notes (Signed)
[place]

## 2019-07-30 NOTE — ED Notes (Signed)
Breakfast Ordered 

## 2019-07-30 NOTE — Progress Notes (Signed)
LE venous duplex       has been completed. Preliminary results can be found under CV proc through chart review. Milbern Doescher, BS, RDMS, RVT   

## 2019-07-30 NOTE — Progress Notes (Signed)
PT Cancellation Note  Patient Details Name: [name] MRN: [id number] DOB: August 07, 1946   Cancelled Treatment:    Reason Eval/Treat Not Completed: Other (comment).  Declined PT stating he is too uncomfortable to try yet.  Agreed to try tomorrow.   Ramond Dial 07/30/2019, 4:29 PM   Mee Hives, PT MS Acute Rehab Dept. Number: [place] and [place]

## 2019-07-30 NOTE — Progress Notes (Signed)
PROGRESS NOTE  [name] HUT:[id number] DOB: 08/21/1946 DOA: 07/29/2019 PCP: John Koch, MD  HPI/Recap of past 24 hours: HPI from Dr [name] is a 73 y.o. male with medical history significant for HFpEF (EF 60-65%), CAD s/p DES and CABG, hepatic cirrhosis, CKD stage III, type 2 diabetes, hyperlipidemia, asthma, anemia of chronic disease, and untreated OSA who presents to the ED for evaluation of volume overload, dyspnea, fatigue, weight gain, and swelling in both lower extremities. Portable chest x-ray showed moderate pulmonary edema.  Patient reports continued progressive swelling in both of his legs.  He has been having increased pain and difficulty walking now. In the ED, vital signs fairly stable, saturating well on room air.  Covid test negative.  Patient admitted for further management.    Today, patient still reports feeling short of breath with bilateral lower extremity tenderness and inability to bear weight due to significant swelling, patient denies any chest pain, cough, abdominal pain, nausea/vomiting, fever/chills.  Assessment/Plan: Principal Problem:   Volume overload Active Problems:   Type 2 diabetes with complication (HCC)   Coronary artery disease    Hyperlipidemia with target LDL less than 70   Hepatic cirrhosis (HCC)   S/P CABG x 3   CKD (chronic kidney disease), stage III   (HFpEF) heart failure with preserved ejection fraction (HCC)  Acute on chronic diastolic HF Appears significantly overloaded BNP 223 Chest x-ray, no frank edema or consolidation Echo with EF of 55 to 60% Continue IV Lasix 60 mg twice daily Monitor strict I/O's, daily weights Bilateral Doppler for lower extremities to rule out DVT PT/OT eval May consult cardiology if no significant diuresis Telemetry  CAD s/p CABG and DES Chest pain-free Not on antiplatelet therapy due to history of upper GI bleed  Hepatic cirrhosis No evidence of ??acute decompensation  or encephalopathy Continue spironolactone plus IV Lasix  Type 2 diabetes mellitus Last A1c 6.1 Continue SSI, Accu-Cheks, hypoglycemic protocol Hold home Metformin  Anemia of chronic disease/History of upper GI bleed Hemoglobin at baseline Daily CBC  Hypertension Continue home spironolactone, continue Lasix  CKD stage III Creatinine at baseline Daily BMP  Asthma: Continue home Anoro Ellipta and as needed albuterol  OSA Continue nocturnal supplemental O2 via [place].         Malnutrition Type:      Malnutrition Characteristics:      Nutrition Interventions:       Estimated body mass index is 36.02 kg/m as calculated from the following:   Height as of this encounter: 5\' 7"  (1.702 m).   Weight as of this encounter: 104.3 kg.     Code Status: Full  Family Communication: None at bedside  Disposition Plan: To be determined   Consultants:  None  Procedures:  None  Antimicrobials:  None  DVT prophylaxis: Lovenox   Objective: Vitals:   07/30/19 0630 07/30/19 0715 07/30/19 0824 07/30/19 1237  BP: (!) 126/54 134/60 137/60 (!) 155/67  Pulse:  78    Resp:  18 20 20   Temp:   99.4 F (37.4 C) 98.9 F (37.2 C)  TempSrc:   Oral Oral  SpO2:  93% 96% 97%  Weight:      Height:        Intake/Output Summary (Last 24 hours) at 07/30/2019 1416 Last data filed at 07/30/2019 0345 Gross per 24 hour  Intake -  Output 1775 ml  Net -1775 ml   Filed Weights   07/29/19 1205  Weight: 104.3 kg  Exam:  General: NAD, obese elderly male  Cardiovascular: S1, S2 present  Respiratory:  Bibasilar crackles noted  Abdomen: Soft, nontender, nondistended, bowel sounds present  Musculoskeletal: 3+ bilateral pedal edema noted  Skin:  Chronic bilateral venous stasis changes  Psychiatry: Normal mood   Data Reviewed: CBC: Recent Labs  Lab 07/27/19 1257 07/29/19 1218 07/30/19 0327  WBC 4.0 5.4 5.5  NEUTROABS 3.0 4.3  --   HGB 8.9* 9.0* 9.4*   HCT 30.6* 30.0* 31.2*  MCV 88.2 86.2 85.7  PLT 124* 135* 235*   Basic Metabolic Panel: Recent Labs  Lab 07/27/19 1257 07/29/19 1218 07/30/19 0327  NA 137 137 139  K 3.7 3.5 3.5  CL 105 101 100  CO2 23 25 26   GLUCOSE 216* 177* 80  BUN 20 17 18   CREATININE 1.25* 1.23 1.21  CALCIUM 8.6* 8.4* 8.4*  MG  --   --  1.9   GFR: Estimated Creatinine Clearance: 62.6 mL/min (by C-G formula based on SCr of 1.21 mg/dL). Liver Function Tests: Recent Labs  Lab 07/27/19 1257 07/29/19 1218  AST 28 25  ALT 20 20  ALKPHOS 107 96  BILITOT 0.9 0.6  PROT 6.6 7.1  ALBUMIN 3.4* 3.4*   No results for input(s): LIPASE, AMYLASE in the last 168 hours. No results for input(s): AMMONIA in the last 168 hours. Coagulation Profile: No results for input(s): INR, PROTIME in the last 168 hours. Cardiac Enzymes: No results for input(s): CKTOTAL, CKMB, CKMBINDEX, TROPONINI in the last 168 hours. BNP (last 3 results) No results for input(s): PROBNP in the last 8760 hours. HbA1C: Recent Labs    07/29/19 2055  HGBA1C 6.1*   CBG: Recent Labs  Lab 07/30/19 0848 07/30/19 1208  GLUCAP 139* 173*   Lipid Profile: No results for input(s): CHOL, HDL, LDLCALC, TRIG, CHOLHDL, LDLDIRECT in the last 72 hours. Thyroid Function Tests: No results for input(s): TSH, T4TOTAL, FREET4, T3FREE, THYROIDAB in the last 72 hours. Anemia Panel: No results for input(s): VITAMINB12, FOLATE, FERRITIN, TIBC, IRON, RETICCTPCT in the last 72 hours. Urine analysis:    Component Value Date/Time   COLORURINE YELLOW 07/29/2019 1849   APPEARANCEUR CLEAR 07/29/2019 1849   LABSPEC 1.010 07/29/2019 1849   PHURINE 6.0 07/29/2019 1849   GLUCOSEU NEGATIVE 07/29/2019 1849   HGBUR NEGATIVE 07/29/2019 [place] 07/29/2019 [place] 07/29/2019 1849   PROTEINUR NEGATIVE 07/29/2019 1849   UROBILINOGEN 0.2 03/29/2014 1546   NITRITE NEGATIVE 07/29/2019 1849   LEUKOCYTESUR NEGATIVE 07/29/2019 1849    Sepsis Labs: @LABRCNTIP (procalcitonin:4,lacticidven:4)  ) Recent Results (from the past 240 hour(s))  SARS CORONAVIRUS 2 (TAT 6-24 HRS) Nasopharyngeal Nasopharyngeal Swab     Status: None   Collection Time: 07/29/19  6:06 PM   Specimen: Nasopharyngeal Swab  Result Value Ref Range Status   SARS Coronavirus 2 NEGATIVE NEGATIVE Final    Comment: (NOTE) SARS-CoV-2 target nucleic acids are NOT DETECTED. The SARS-CoV-2 RNA is generally detectable in upper and lower respiratory specimens during the acute phase of infection. Negative results do not preclude SARS-CoV-2 infection, do not rule out co-infections with other pathogens, and should not be used as the sole basis for treatment or other patient management decisions. Negative results must be combined with clinical observations, patient history, and epidemiological information. The expected result is Negative. Fact Sheet for Patients: SugarRoll.be Fact Sheet for Healthcare Providers: https://www.woods-mathews.com/ This test is not yet approved or cleared by the [place] FDA and  has been authorized for  detection and/or diagnosis of SARS-CoV-2 by FDA under an Emergency Use Authorization (EUA). This EUA will remain  in effect (meaning this test can be used) for the duration of the COVID-19 declaration under Section 56 4(b)(1) of the Act, 21 U.S.C. section 360bbb-3(b)(1), unless the authorization is terminated or revoked sooner. Performed at [facility], [place] [address] [place], [place] [address]       Studies: Dg Chest Portable 1 View  Result Date: 07/29/2019 CLINICAL DATA:  Shortness of breath and fever EXAM: PORTABLE CHEST 1 VIEW COMPARISON:  July 27, 2019. FINDINGS: There are areas of mild scarring bilaterally. No frank edema or consolidation. Heart is mildly enlarged with pulmonary vascularity within normal limits. There is aortic atherosclerosis. Patient is status  post coronary artery bypass grafting. No adenopathy. No bone lesions. IMPRESSION: Scattered areas of lung scarring bilaterally. No frank edema or consolidation. Heart is mildly enlarged with pulmonary vascularity normal. Postoperative changes noted. No adenopathy. Aortic Atherosclerosis (ICD10-I70.0). Electronically Signed   By: Lowella Grip III M.D.   On: 07/29/2019 14:36    Scheduled Meds: . ferrous sulfate  325 mg Oral Q supper  . furosemide  60 mg Intravenous Q12H  . gabapentin  100 mg Oral BID  . insulin aspart  0-9 Units Subcutaneous TID WC  . potassium chloride  40 mEq Oral Daily  . sodium chloride flush  3 mL Intravenous Once  . sodium chloride flush  3 mL Intravenous Q12H  . spironolactone  50 mg Oral Daily  . umeclidinium-vilanterol  1 puff Inhalation Daily    Continuous Infusions:   LOS: 0 days     Alma Friendly, MD [facility]  If 7PM-7AM, please contact night-coverage [email or web address] 07/30/2019, 2:16 PM

## 2019-07-30 NOTE — ED Notes (Signed)
ED TO INPATIENT HANDOFF REPORT  ED Nurse Name and Phone #: Marya Amsler RN [phone]  S Name/Age/Gender [name] 73 y.o. male Room/Bed: 012C/012C  Code Status   Code Status: Full Code  Home/SNF/Other Home Patient oriented to: self, place, time and situation Is this baseline? Yes   Triage Complete: Triage complete  Chief Complaint pain/fever  Triage Note Pt from home with ems for c.o fever and bilateral leg swelling. EMS reports a fever as high as 103. Pt seen here 2 days ago for leg swelling, sent home to diurese. Hx of CHF. Pt a.o, pitting edema noted with redness.   EMS gave 1013m tylenol PTA   Allergies No Known Allergies  Level of Care/Admitting Diagnosis ED Disposition    [id number]  Level of Care: Telemetry Cardiac [103]  I expect the patient will be discharged within 24 hours: Yes  LOW acuity---Tx typically complete <24 hrs---ACUTE conditions typically can be evaluated <24 hours---LABS likely to return to acceptable levels <24 hours---IS near functional baseline---EXPECTED to return to current living arrangement---NOT newly hypoxic: Meets criteria for 5C-Observation unit  Covid Evaluation: Asymptomatic Screening Protocol (No Symptoms)  Diagnosis: Volume overload [[[id number]] Admitting Physician: PLenore Cordia[[[id number] Attending Physician: PLenore Cordia[[[id number] PT Class (Do Not Modify): Observation [104]  PT Acc Code (Do Not Modify): Observation [10022]       B Medical/Surgery History Past Medical History:  Diagnosis Date  . Arthritis    "joints tighten up sometimes" (03/27/2104)  . Asthma   . CHF (congestive heart failure) ([place]   . Chronic low back pain   . Chronic lower back pain   . Coronary artery disease    a. 05/2012 Cath/PCI: LM 40ost, 50-60d, LAD 99p ruptured plaque (3.0x28 DES), LCX 50p/m, RCA 30-40p, 579mEF 65-70%.  . Family history of adverse reaction to  anesthesia    Pt granddaughter had PONV  . GERD (gastroesophageal reflux disease)   . Hepatic cirrhosis ([place]   a. Dx 01/2014 - CT a/p   . History of blood transfusion    "related to bleeding ulcers"  . History of concussion    1976--  NO RESIDUAL  . History of GI bleed    a. UGIB 07/2012;  b. 01/2014 admission with GIB/FOB stool req 1U prbc's->EGD showed portal gastropathy, barrett's esoph, and chronic active h. pylori gastritis.  . Marland Kitchenistory of gout    2007 &  2008  LEFT LEG-- NO ISSUE SINCE  . Hyperlipidemia   . Iron deficiency anemia   . Kidney stones   . Lipoma   . OA (osteoarthritis of spine)    LOWER BACK--  INTERMITTANT LEFT LEG NUMBNESS  . OSA (obstructive sleep apnea)    PULMOLOGIST-  DR CLANCE--  MODERATE OSA  STARTED CPAP 2012--  BUT CURRENTLY HAS NOT USED PAST 6 MONTHS  . Phimosis    a. s/p circumcision 2015.  . Type 2 diabetes mellitus ([place] [place]  . Unspecified essential hypertension    Past Surgical History:  Procedure Laterality Date  . ANKLE FRACTURE SURGERY Right 1989   "plate put in"  . APPENDECTOMY  05-16-2004   open  . CIRCUMCISION N/A 09/09/2013   Procedure: CIRCUMCISION ADULT;  Surgeon: DaBernestine AmassMD;  [facility] Service: [facility]rology;  Laterality: N/A;  . COLECTOMY  05-16-2004  . CORONARY ANGIOPLASTY WITH STENT PLACEMENT  06/28/2012  DR  COOPER   PCI W/  X1 DES to [place]. LAD/  LM  40% OSTIAL & 50-60% DISTAL /  50% PROX LCX/  30-40% PROX RCA & 50% MID RCA/   LVEF 65-70%  . CORONARY ARTERY BYPASS GRAFT N/A 03/31/2014   Procedure: CORONARY ARTERY BYPASS GRAFTING (CABG) times 3 using left internal mammary artery and right saphenous vein.;  Surgeon: Melrose Nakayama, MD;  Location: [place];  Service: Open Heart Surgery;  Laterality: N/A;  . DOBUTAMINE STRESS ECHO  06-08-2012   MODERATE HYPOKINESIS/ ISCHEMIA MID INFERIOR WALL  . ESOPHAGOGASTRODUODENOSCOPY  08/15/2012   Procedure: ESOPHAGOGASTRODUODENOSCOPY (EGD);  Surgeon: Wonda Horner,  MD;  Location[facility] ENDOSCOPY;  Service: Endoscopy;  Laterality: N/A;  . ESOPHAGOGASTRODUODENOSCOPY N/A 02/17/2014   Procedure: ESOPHAGOGASTRODUODENOSCOPY (EGD);  Surgeon: Jeryl Columbia, MD;  Location[facility] END[facility]COPY;  Service: Endoscopy;  Laterality: N/A;  . ESOPHAGOGASTRODUODENOSCOPY (EGD) WITH PROPOFOL N/A 09/30/2017   Procedure: ESOPHAGOGASTRODUODENOSCOPY (EGD) WITH PROPOFOL;  Surgeon: Wonda Horner, MD;  Location[facility]COPY;  Service: Endoscopy;  Laterality: N/A;  . ESOPHAGOGASTRODUODENOSCOPY (EGD) WITH PROPOFOL N/A 12/20/2017   Procedure: ESOPHAGOGASTRODUODENOSCOPY (EGD) WITH PROPOFOL;  Surgeon: Clarene Essex, MD;  Location: [place];  Service: Endoscopy;  Laterality: N/A;  . HERNIA REPAIR    . INTRAOPERATIVE TRANSESOPHAGEAL ECHOCARDIOGRAM N/A 03/31/2014   Procedure: INTRAOPERATIVE TRANSESOPHAGEAL ECHOCARDIOGRAM;  Surgeon: Melrose Nakayama, MD;  Location: [place];  Service: Open Heart Surgery;  Laterality: N/A;  . LAPAROSCOPIC UMBILICAL HERNIA REPAIR W/ MESH  06-06-2011  . LEFT HEART CATHETERIZATION WITH CORONARY ANGIOGRAM N/A 03/28/2014   Procedure: LEFT HEART CATHETERIZATION WITH CORONARY ANGIOGRAM;  Surgeon: Sinclair Grooms, MD;  Location: [facility] CATH LAB;  Service: Cardiovascular;  Laterality: N/A;  . LIPOMA EXCISION Left 08/25/2017   Procedure: EXCISION LIPOMA LEFT POSTERIOR THIGH;  Surgeon: Clovis Riley, MD;  Location: [place];  Service: General;  Laterality: Left;  . NEPHROLITHOTOMY  1990'S  . OPEN APPENDECTOMY W/ PARTIAL CECECTOMY  05-16-2004  . PERCUTANEOUS CORONARY STENT INTERVENTION (PCI-S) N/A 06/28/2012   Procedure: PERCUTANEOUS CORONARY STENT INTERVENTION (PCI-S);  Surgeon: Sherren Mocha, MD;  Location: [facility] CATH LAB;  Service: Cardiovascular;  Laterality: N/A;     A IV Location/Drains/Wounds Patient Lines/Drains/Airways Status   Active Line/Drains/Airways    Name:   Placement date:   Placement time:   Site:   Days:   Peripheral IV 07/29/19 Left Antecubital   07/29/19     1203    Antecubital   1   External Urinary Catheter   07/30/19    0600    -   less than 1          Intake/Output Last 24 hours  Intake/Output Summary (Last 24 hours) at 07/30/2019 0730 Last data filed at 07/30/2019 0345 Gross per 24 hour  Intake -  Output 1775 ml  Net -1775 ml    Labs/Imaging Results for orders placed or performed during the hospital encounter of 07/29/19 (from the past 48 hour(s))  Lactic acid, plasma     Status: None   Collection Time: 07/29/19 12:18 PM  Result Value Ref Range   Lactic Acid, Venous 1.8 0.5 - 1.9 mmol/L    Comment: Performed at [facility], [address] [place], [place] [address]  Comprehensive metabolic panel     Status: Abnormal   Collection Time: 07/29/19 12:18 PM  Result Value Ref Range   Sodium 137 135 - 145 mmol/L   Potassium 3.5 3.5 - 5.1 mmol/L   Chloride  101 98 - 111 mmol/L   CO2 25 22 - 32 mmol/L   Glucose, Bld 177 (H) 70 - 99 mg/dL   BUN 17 8 - 23 mg/dL   Creatinine, Ser 1.23 0.61 - 1.24 mg/dL   Calcium 8.4 (L) 8.9 - 10.3 mg/dL   Total Protein 7.1 6.5 - 8.1 g/dL   Albumin 3.4 (L) 3.5 - 5.0 g/dL   AST 25 15 - 41 U/L   ALT 20 0 - 44 U/L   Alkaline Phosphatase 96 38 - 126 U/L   Total Bilirubin 0.6 0.3 - 1.2 mg/dL   GFR calc non Af Amer 58 (L) >60 mL/min   GFR calc Af Amer >60 >60 mL/min   Anion gap 11 5 - 15    Comment: Performed at [facility] [address] [place], [place] [address]  CBC with Differential     Status: Abnormal   Collection Time: 07/29/19 12:18 PM  Result Value Ref Range   WBC 5.4 4.0 - 10.5 K/uL   RBC 3.48 (L) 4.22 - 5.81 MIL/uL   Hemoglobin 9.0 (L) 13.0 - 17.0 g/dL   HCT 30.0 (L) 39.0 - 52.0 %   MCV 86.2 80.0 - 100.0 fL   MCH 25.9 (L) 26.0 - 34.0 pg   MCHC 30.0 30.0 - 36.0 g/dL   RDW 15.4 11.5 - 15.5 %   Platelets 135 (L) 150 - 400 K/uL   nRBC 0.0 0.0 - 0.2 %   Neutrophils Relative % 80 %   Neutro Abs 4.3 1.7 - 7.7 K/uL   Lymphocytes Relative 11 %   Lymphs Abs 0.6 (L) 0.7 - 4.0  K/uL   Monocytes Relative 8 %   Monocytes Absolute 0.5 0.1 - 1.0 K/uL   Eosinophils Relative 1 %   Eosinophils Absolute 0.0 0.0 - 0.5 K/uL   Basophils Relative 0 %   Basophils Absolute 0.0 0.0 - 0.1 K/uL   Immature Granulocytes 0 %   Abs Immature Granulocytes 0.02 0.00 - 0.07 K/uL    Comment: Performed at [facility], [place] [address], [place], [place] [address]  POC SARS Coronavirus 2 Ag-ED - Nasal Swab (BD Veritor Kit)     Status: None   Collection Time: 07/29/19  4:55 PM  Result Value Ref Range   SARS Coronavirus 2 Ag NEGATIVE NEGATIVE    Comment: (NOTE) SARS-CoV-2 antigen NOT DETECTED.  Negative results are presumptive.  Negative results do not preclude SARS-CoV-2 infection and should not be used as the sole basis for treatment or other patient management decisions, including infection  control decisions, particularly in the presence of clinical signs and  symptoms consistent with COVID-19, or in those who have been in contact with the virus.  Negative results must be combined with clinical observations, patient history, and epidemiological information. The expected result is Negative. Fact Sheet for Patients: PodPark.tn Fact Sheet for Healthcare Providers: GiftContent.is This test is not yet approved or cleared by the [place] FDA and  has been authorized for detection and/or diagnosis of SARS-CoV-2 by FDA under an Emergency Use Authorization (EUA).  This EUA will remain in effect (meaning this test can be used) for the duration of  the COVID-19 de claration under Section 564(b)(1) of the Act, 21 U.S.C. section 360bbb-3(b)(1), unless the authorization is terminated or revoked sooner.   SARS CORONAVIRUS 2 (TAT 6-24 HRS) Nasopharyngeal Nasopharyngeal Swab     Status: None   Collection Time: 07/29/19  6:06 PM   Specimen:  Nasopharyngeal Swab  Result Value Ref Range   SARS Coronavirus 2 NEGATIVE NEGATIVE     Comment: (NOTE) SARS-CoV-2 target nucleic acids are NOT DETECTED. The SARS-CoV-2 RNA is generally detectable in upper and lower respiratory specimens during the acute phase of infection. Negative results do not preclude SARS-CoV-2 infection, do not rule out co-infections with other pathogens, and should not be used as the sole basis for treatment or other patient management decisions. Negative results must be combined with clinical observations, patient history, and epidemiological information. The expected result is Negative. Fact Sheet for Patients: SugarRoll.be Fact Sheet for Healthcare Providers: https://www.woods-mathews.com/ This test is not yet approved or cleared by the [place] FDA and  has been authorized for detection and/or diagnosis of SARS-CoV-2 by FDA under an Emergency Use Authorization (EUA). This EUA will remain  in effect (meaning this test can be used) for the duration of the COVID-19 declaration under Section 56 4(b)(1) of the Act, 21 U.S.C. section 360bbb-3(b)(1), unless the authorization is terminated or revoked sooner. Performed at [facility], [place] [address], [place], [place] [address]   Urinalysis, Routine w reflex microscopic     Status: None   Collection Time: 07/29/19  6:49 PM  Result Value Ref Range   Color, Urine YELLOW YELLOW   APPearance CLEAR CLEAR   Specific Gravity, Urine 1.010 1.005 - 1.030   pH 6.0 5.0 - 8.0   Glucose, UA NEGATIVE NEGATIVE mg/dL   Hgb urine dipstick NEGATIVE NEGATIVE   Bilirubin Urine NEGATIVE NEGATIVE   Ketones, ur NEGATIVE NEGATIVE mg/dL   Protein, ur NEGATIVE NEGATIVE mg/dL   Nitrite NEGATIVE NEGATIVE   Leukocytes,Ua NEGATIVE NEGATIVE    Comment: Performed at [facility] [address], [place], [place] [address]  Hemoglobin A1c     Status: Abnormal   Collection Time: 07/29/19  8:55 PM  Result Value Ref Range   Hgb A1c MFr Bld 6.1 (H) 4.8 - 5.6 %     Comment: (NOTE) Pre diabetes:          5.7%-6.4% Diabetes:              >6.4% Glycemic control for   <7.0% adults with diabetes    Mean Plasma Glucose 128.37 mg/dL    Comment: Performed at [facility] [address] [place], [place] [address]  Brain natriuretic peptide     Status: Abnormal   Collection Time: 07/29/19  9:00 PM  Result Value Ref Range   B Natriuretic Peptide 223.9 (H) 0.0 - 100.0 pg/mL    Comment: Performed at [facility] [address] [place], [place] [address]  Magnesium     Status: None   Collection Time: 07/30/19  3:27 AM  Result Value Ref Range   Magnesium 1.9 1.7 - 2.4 mg/dL    Comment: Performed at [facility] [address], [place], [place] [address]  CBC     Status: Abnormal   Collection Time: 07/30/19  3:27 AM  Result Value Ref Range   WBC 5.5 4.0 - 10.5 K/uL   RBC 3.64 (L) 4.22 - 5.81 MIL/uL   Hemoglobin 9.4 (L) 13.0 - 17.0 g/dL   HCT 31.2 (L) 39.0 - 52.0 %   MCV 85.7 80.0 - 100.0 fL   MCH 25.8 (L) 26.0 - 34.0 pg   MCHC 30.1 30.0 - 36.0 g/dL   RDW 15.3 11.5 - 15.5 %   Platelets 142 (L) 150 - 400 K/uL  nRBC 0.0 0.0 - 0.2 %    Comment: Performed at [facility], [place] [address] [place], [place] [address]  Basic metabolic panel     Status: Abnormal   Collection Time: 07/30/19  3:27 AM  Result Value Ref Range   Sodium 139 135 - 145 mmol/L   Potassium 3.5 3.5 - 5.1 mmol/L   Chloride 100 98 - 111 mmol/L   CO2 26 22 - 32 mmol/L   Glucose, Bld 80 70 - 99 mg/dL   BUN 18 8 - 23 mg/dL   Creatinine, Ser 1.21 0.61 - 1.24 mg/dL   Calcium 8.4 (L) 8.9 - 10.3 mg/dL   GFR calc non Af Amer 59 (L) >60 mL/min   GFR calc Af Amer >60 >60 mL/min   Anion gap 13 5 - 15    Comment: Performed at [facility], [place] [address], [place], [place] [address]   Dg Chest Portable 1 View  Result Date: 07/29/2019 CLINICAL DATA:  Shortness of breath and fever EXAM: PORTABLE CHEST 1 VIEW COMPARISON:  July 27, 2019. FINDINGS: There are  areas of mild scarring bilaterally. No frank edema or consolidation. Heart is mildly enlarged with pulmonary vascularity within normal limits. There is aortic atherosclerosis. Patient is status post coronary artery bypass grafting. No adenopathy. No bone lesions. IMPRESSION: Scattered areas of lung scarring bilaterally. No frank edema or consolidation. Heart is mildly enlarged with pulmonary vascularity normal. Postoperative changes noted. No adenopathy. Aortic Atherosclerosis (ICD10-I70.0). Electronically Signed   By: Lowella Grip III M.D.   On: 07/29/2019 14:36    Pending Labs Unresulted Labs (From admission, onward)   None      Vitals/Pain Today's Vitals   07/30/19 0629 07/30/19 0630 07/30/19 0715 07/30/19 0718  BP:  (!) 126/54 134/60   Pulse: 75  78   Resp:      Temp:      TempSrc:      SpO2: 93%  93%   Weight:      Height:      PainSc:    0-No pain    Isolation Precautions No active isolations  Medications Medications  sodium chloride flush (NS) 0.9 % injection 3 mL (3 mLs Intravenous Not Given 07/29/19 1822)  sodium chloride flush (NS) 0.9 % injection 3 mL (3 mLs Intravenous Not Given 07/30/19 0131)  acetaminophen (TYLENOL) tablet 650 mg (has no administration in time range)    Or  acetaminophen (TYLENOL) suppository 650 mg (has no administration in time range)  furosemide (LASIX) injection 60 mg (60 mg Intravenous Given 07/30/19 0545)  potassium chloride 20 MEQ/15ML (10%) solution 40 mEq (40 mEq Oral Given 07/29/19 2229)  albuterol (VENTOLIN HFA) 108 (90 Base) MCG/ACT inhaler 2 puff (has no administration in time range)  ferrous sulfate tablet 325 mg (has no administration in time range)  gabapentin (NEURONTIN) capsule 100 mg (100 mg Oral Given 07/29/19 2229)  spironolactone (ALDACTONE) tablet 50 mg (has no administration in time range)  umeclidinium-vilanterol (ANORO ELLIPTA) 62.5-25 MCG/INH 1 puff (1 puff Inhalation Not Given 07/29/19 2206)  insulin aspart  (novoLOG) injection 0-9 Units (has no administration in time range)  oxyCODONE-acetaminophen (PERCOCET/ROXICET) 5-325 MG per tablet 1 tablet (1 tablet Oral Given 07/30/19 0543)  doxycycline (VIBRA-TABS) tablet 100 mg (100 mg Oral Given 07/29/19 1841)  furosemide (LASIX) injection 60 mg (60 mg Intravenous Given 07/29/19 1841)  gabapentin (NEURONTIN) capsule 200 mg (200 mg Oral Given 07/29/19 1840)  oxyCODONE-acetaminophen (PERCOCET/ROXICET) 5-325 MG per tablet 1 tablet (  1 tablet Oral Given 07/29/19 1840)    Mobility walks with device Low fall risk   Focused Assessments Cardiac Assessment Handoff:    Lab Results  Component Value Date   TROPONINI <0.03 12/24/2018   No results found for: DDIMER Does the Patient currently have chest pain? No     R Recommendations: See Admitting Provider Note  Report given to:   Additional Notes:

## 2019-07-30 NOTE — Progress Notes (Signed)
  Echocardiogram 2D Echocardiogram has been performed.  07/30/2019, 11:15 AM

## 2019-07-31 ENCOUNTER — Encounter (HOSPITAL_COMMUNITY): Payer: Self-pay

## 2019-07-31 DIAGNOSIS — I509 Heart failure, unspecified: Secondary | ICD-10-CM

## 2019-07-31 LAB — GLUCOSE, CAPILLARY
Glucose-Capillary: 117 mg/dL — ABNORMAL HIGH (ref 70–99)
Glucose-Capillary: 152 mg/dL — ABNORMAL HIGH (ref 70–99)
Glucose-Capillary: 180 mg/dL — ABNORMAL HIGH (ref 70–99)
Glucose-Capillary: 203 mg/dL — ABNORMAL HIGH (ref 70–99)

## 2019-07-31 LAB — CBC WITH DIFFERENTIAL/PLATELET
Abs Immature Granulocytes: 0.02 10*3/uL (ref 0.00–0.07)
Basophils Absolute: 0 10*3/uL (ref 0.0–0.1)
Basophils Relative: 0 %
Eosinophils Absolute: 0.1 10*3/uL (ref 0.0–0.5)
Eosinophils Relative: 1 %
HCT: 29.3 % — ABNORMAL LOW (ref 39.0–52.0)
Hemoglobin: 8.7 g/dL — ABNORMAL LOW (ref 13.0–17.0)
Immature Granulocytes: 0 %
Lymphocytes Relative: 13 %
Lymphs Abs: 0.8 10*3/uL (ref 0.7–4.0)
MCH: 25.1 pg — ABNORMAL LOW (ref 26.0–34.0)
MCHC: 29.7 g/dL — ABNORMAL LOW (ref 30.0–36.0)
MCV: 84.4 fL (ref 80.0–100.0)
Monocytes Absolute: 0.6 10*3/uL (ref 0.1–1.0)
Monocytes Relative: 11 %
Neutro Abs: 4.3 10*3/uL (ref 1.7–7.7)
Neutrophils Relative %: 75 %
Platelets: 138 10*3/uL — ABNORMAL LOW (ref 150–400)
RBC: 3.47 MIL/uL — ABNORMAL LOW (ref 4.22–5.81)
RDW: 15.2 % (ref 11.5–15.5)
WBC: 5.8 10*3/uL (ref 4.0–10.5)
nRBC: 0 % (ref 0.0–0.2)

## 2019-07-31 LAB — BASIC METABOLIC PANEL
Anion gap: 12 (ref 5–15)
BUN: 19 mg/dL (ref 8–23)
CO2: 26 mmol/L (ref 22–32)
Calcium: 8.3 mg/dL — ABNORMAL LOW (ref 8.9–10.3)
Chloride: 97 mmol/L — ABNORMAL LOW (ref 98–111)
Creatinine, Ser: 1.24 mg/dL (ref 0.61–1.24)
GFR calc Af Amer: >60 mL/min (ref >60)
GFR calc non Af Amer: 57 mL/min — ABNORMAL LOW (ref >60)
Glucose, Bld: 117 mg/dL — ABNORMAL HIGH (ref 70–99)
Potassium: 3.7 mmol/L (ref 3.5–5.1)
Sodium: 135 mmol/L (ref 135–145)

## 2019-07-31 MED ORDER — MAGNESIUM CITRATE PO SOLN
1.0000 | Freq: Once | ORAL | Status: AC
  Administered 2019-07-31: 1 via ORAL
  Filled 2019-07-31: qty 296

## 2019-07-31 MED ORDER — LACTULOSE 10 GM/15ML PO SOLN
20.0000 g | Freq: Two times a day (BID) | ORAL | Status: DC | PRN
  Administered 2019-08-04 – 2019-08-06 (×3): 20 g via ORAL
  Filled 2019-07-31 (×3): qty 30

## 2019-07-31 MED ORDER — MAGNESIUM CITRATE PO SOLN: 1.0000 | Freq: Once | ORAL | Status: DC

## 2019-07-31 NOTE — Progress Notes (Signed)
PROGRESS NOTE    [name]  JYN:[id number] DOB: 08-Jan-1946 DOA: 07/29/2019 PCP: [name], MD    Brief Narrative:  73 y.o.malewith medical history significant forHFpEF (EF 60-65%), CAD s/p DES and CABG, hepatic cirrhosis, CKD stage III, type 2 diabetes, hyperlipidemia, asthma, anemia of chronic disease, anduntreatedOSA who presents to the ED for evaluation of volume overload, dyspnea, fatigue, weight gain, and swelling in both lower extremities. Portable chest x-ray showed moderate pulmonary edema. Patient reports continued progressive swelling in both of his legs. He has been having increased pain and difficulty walking now. In the ED, vital signs fairly stable, saturating well on room air.  Covid test negative.  Patient admitted for further management.  Assessment & Plan:   Principal Problem:   Volume overload Active Problems:   Type 2 diabetes with complication (HCC)   Coronary artery disease    Hyperlipidemia with target LDL less than 70   Hepatic cirrhosis (HCC)   S/P CABG x 3   CKD (chronic kidney disease), stage III   (HFpEF) heart failure with preserved ejection fraction (HCC)  Acute on chronic diastolic HF -Clinically volume overloaded -Presenting BNP 223 -Continue IV Lasix 60 mg twice daily -Monitor strict I/O's, daily weights -Bilateral Doppler personally reviewed, no evidence of DVT -PT/OT eval -Repeat 2d echo reviewed with EF of 55-60% -Overnight diuresed over 2L. Clinically improving -Continue diuresis as tolerated  CAD s/p CABG and DES -Chest pain-free -Not on antiplatelet therapy due to history of upper GI bleed  Hepatic cirrhosis -Wihtout evidence of acute decompensation or encephalopathy -Continue spironolactone plus IV Lasix as tolerated  Type 2 diabetes mellitus -Last A1c 6.1 -Continue SSI, Accu-Cheks, hypoglycemic protocol -Holding home Metformin while in hospital  Anemia of chronic disease/History ofupperGI bleed  -Hemoglobin at baseline -Daily CBC  Hypertension -Continue home spironolactone -continue Lasix as tolerated  CKD stage III -Creatinine at baseline -Daily BMP  Asthma: -Continue home Anoro Ellipta and as needed albuterol -Stable at this time  OSA -Continue nocturnal supplemental O2 via [place].  DVT prophylaxis: lovenox subQ Code Status: Full Family Communication: Pt in room, family not at bedside Disposition Plan: Uncertain at this time  Consultants:     Procedures:     Antimicrobials: Anti-infectives (From admission, onward)   Start     Dose/Rate Route Frequency Ordered Stop   07/29/19 1815  doxycycline (VIBRA-TABS) tablet 100 mg     100 mg Oral  Once 07/29/19 1806 07/29/19 1841       Subjective: Reports swelling seems better.   Objective: Vitals:   07/30/19 1836 07/30/19 2335 07/31/19 0518 07/31/19 1250  BP: (!) 152/65 129/61 (!) 142/80 140/62  Pulse:  74 75 79  Resp: 19 20 20 18   Temp: 99.6 F (37.6 C) 100 F (37.8 C) 98.8 F (37.1 C) 98.1 F (36.7 C)  TempSrc: Oral Oral Oral Oral  SpO2:  95% 93% 96%  Weight:   113 kg   Height:        Intake/Output Summary (Last 24 hours) at 07/31/2019 1535 Last data filed at 07/31/2019 0530 Gross per 24 hour  Intake 3 ml  Output 2550 ml  Net -2547 ml   Filed Weights   07/29/19 1205 07/31/19 0518  Weight: 104.3 kg 113 kg    Examination:  General exam: Appears calm and comfortable  Respiratory system: Clear to auscultation. Respiratory effort normal. Cardiovascular system: S1 & S2 heard, Regular Gastrointestinal system: Abdomen is nondistended, soft and nontender. No organomegaly or masses felt. Normal  bowel sounds heard. Central nervous system: Alert and oriented. No focal neurological deficits. Extremities: Symmetric 5 x 5 power, BLE edema Skin: No rashes, lesions Psychiatry: Judgement and insight appear normal. Mood & affect appropriate.   Data Reviewed: I have personally reviewed following labs  and imaging studies  CBC: Recent Labs  Lab 07/27/19 1257 07/29/19 1218 07/30/19 0327 07/31/19 0509  WBC 4.0 5.4 5.5 5.8  NEUTROABS 3.0 4.3  --  4.3  HGB 8.9* 9.0* 9.4* 8.7*  HCT 30.6* 30.0* 31.2* 29.3*  MCV 88.2 86.2 85.7 84.4  PLT 124* 135* 142* 357*   Basic Metabolic Panel: Recent Labs  Lab 07/27/19 1257 07/29/19 1218 07/30/19 0327 07/31/19 0509  NA 137 137 139 135  K 3.7 3.5 3.5 3.7  CL 105 101 100 97*  CO2 23 25 26 26   GLUCOSE 216* 177* 80 117*  BUN 20 17 18 19   CREATININE 1.25* 1.23 1.21 1.24  CALCIUM 8.6* 8.4* 8.4* 8.3*  MG  --   --  1.9  --    GFR: Estimated Creatinine Clearance: 63.7 mL/min (by C-G formula based on SCr of 1.24 mg/dL). Liver Function Tests: Recent Labs  Lab 07/27/19 1257 07/29/19 1218  AST 28 25  ALT 20 20  ALKPHOS 107 96  BILITOT 0.9 0.6  PROT 6.6 7.1  ALBUMIN 3.4* 3.4*   No results for input(s): LIPASE, AMYLASE in the last 168 hours. No results for input(s): AMMONIA in the last 168 hours. Coagulation Profile: No results for input(s): INR, PROTIME in the last 168 hours. Cardiac Enzymes: No results for input(s): CKTOTAL, CKMB, CKMBINDEX, TROPONINI in the last 168 hours. BNP (last 3 results) No results for input(s): PROBNP in the last 8760 hours. HbA1C: Recent Labs    07/29/19 2055  HGBA1C 6.1*   CBG: Recent Labs  Lab 07/30/19 1208 07/30/19 1625 07/30/19 2344 07/31/19 0521 07/31/19 1110  GLUCAP 173* 117* 111* 117* 152*   Lipid Profile: No results for input(s): CHOL, HDL, LDLCALC, TRIG, CHOLHDL, LDLDIRECT in the last 72 hours. Thyroid Function Tests: No results for input(s): TSH, T4TOTAL, FREET4, T3FREE, THYROIDAB in the last 72 hours. Anemia Panel: No results for input(s): VITAMINB12, FOLATE, FERRITIN, TIBC, IRON, RETICCTPCT in the last 72 hours. Sepsis Labs: Recent Labs  Lab 07/29/19 1218  LATICACIDVEN 1.8    Recent Results (from the past 240 hour(s))  SARS CORONAVIRUS 2 (TAT 6-24 HRS) Nasopharyngeal  Nasopharyngeal Swab     Status: None   Collection Time: 07/29/19  6:06 PM   Specimen: Nasopharyngeal Swab  Result Value Ref Range Status   SARS Coronavirus 2 NEGATIVE NEGATIVE Final    Comment: (NOTE) SARS-CoV-2 target nucleic acids are NOT DETECTED. The SARS-CoV-2 RNA is generally detectable in upper and lower respiratory specimens during the acute phase of infection. Negative results do not preclude SARS-CoV-2 infection, do not rule out co-infections with other pathogens, and should not be used as the sole basis for treatment or other patient management decisions. Negative results must be combined with clinical observations, patient history, and epidemiological information. The expected result is Negative. Fact Sheet for Patients: SugarRoll.be Fact Sheet for Healthcare Providers: https://www.woods-mathews.com/ This test is not yet approved or cleared by the [place] FDA and  has been authorized for detection and/or diagnosis of SARS-CoV-2 by FDA under an Emergency Use Authorization (EUA). This EUA will remain  in effect (meaning this test can be used) for the duration of the COVID-19 declaration under Section 56 4(b)(1) of the Act, 21 U.S.C. section  360bbb-3(b)(1), unless the authorization is terminated or revoked sooner. Performed at [facility], [place] [address], [place], [place] [address]      Radiology Studies: Vas [place] Lower Extremity Venous (dvt)  Result Date: 07/30/2019  Lower Venous Study Indications: Edema.  Limitations: Body habitus. Comparison Study: 11/20/17 negative Performing Technologist: June Leap RDMS, RVT  Examination Guidelines: A complete evaluation includes B-mode imaging, spectral Doppler, color Doppler, and power Doppler as needed of all accessible portions of each vessel. Bilateral testing is considered an integral part of a complete examination. Limited examinations for reoccurring indications may be  performed as noted.  +---------+---------------+---------+-----------+----------+-------------------+ RIGHT    CompressibilityPhasicitySpontaneityPropertiesThrombus Aging      +---------+---------------+---------+-----------+----------+-------------------+ CFV      Full           Yes      Yes                                      +---------+---------------+---------+-----------+----------+-------------------+ SFJ      Full                                                             +---------+---------------+---------+-----------+----------+-------------------+ FV Prox  Full                                                             +---------+---------------+---------+-----------+----------+-------------------+ FV Mid   Full                                                             +---------+---------------+---------+-----------+----------+-------------------+ FV Distal               Yes      Yes                                      +---------+---------------+---------+-----------+----------+-------------------+ PFV      Full                                                             +---------+---------------+---------+-----------+----------+-------------------+ POP      Full           Yes      Yes                                      +---------+---------------+---------+-----------+----------+-------------------+ PTV      Full  not well visualized +---------+---------------+---------+-----------+----------+-------------------+ PERO     Full                                         not well visualized +---------+---------------+---------+-----------+----------+-------------------+   Right Technical Findings: Pulsatile venous flow suggestive of increased right side heart pressure.  +---------+---------------+---------+-----------+----------+-------------------+ LEFT      CompressibilityPhasicitySpontaneityPropertiesThrombus Aging      +---------+---------------+---------+-----------+----------+-------------------+ CFV      Full                                                             +---------+---------------+---------+-----------+----------+-------------------+ SFJ      Full                                                             +---------+---------------+---------+-----------+----------+-------------------+ FV Prox  Full                                                             +---------+---------------+---------+-----------+----------+-------------------+ FV Mid   Full                                                             +---------+---------------+---------+-----------+----------+-------------------+ FV DistalFull                                                             +---------+---------------+---------+-----------+----------+-------------------+ PFV      Full                                                             +---------+---------------+---------+-----------+----------+-------------------+ POP                     Yes      Yes                                      +---------+---------------+---------+-----------+----------+-------------------+ PTV      Full                                         not well visualized +---------+---------------+---------+-----------+----------+-------------------+  PERO     Full                                         not well visualized +---------+---------------+---------+-----------+----------+-------------------+   Left Technical Findings: Pulsatile venous flow suggestive of increased right side heart pressure.   Summary: Right: There is no evidence of deep vein thrombosis proximal to the inguinal ligament or in the common femoral vein. No cystic structure found in the popliteal fossa. Left: There is no evidence of deep vein thrombosis proximal  to the inguinal ligament or in the common femoral vein. A cystic structure is found in the popliteal fossa.  *See table(s) above for measurements and observations. Electronically signed by Deitra Mayo MD on 07/30/2019 at 5:38:24 PM.    Final     Scheduled Meds: . enoxaparin (LOVENOX) injection  40 mg Subcutaneous Q24H  . ferrous sulfate  325 mg Oral Q supper  . furosemide  60 mg Intravenous Q12H  . gabapentin  100 mg Oral BID  . insulin aspart  0-9 Units Subcutaneous TID WC  . magnesium citrate  1 Bottle Oral Once  . potassium chloride  40 mEq Oral Daily  . sodium chloride flush  3 mL Intravenous Once  . sodium chloride flush  3 mL Intravenous Q12H  . spironolactone  50 mg Oral Daily  . umeclidinium-vilanterol  1 puff Inhalation Daily   Continuous Infusions:   LOS: 1 day   Marylu Lund, MD [facility] Pager On Amion  If 7PM-7AM, please contact night-coverage 07/31/2019, 3:35 PM

## 2019-07-31 NOTE — Evaluation (Addendum)
Occupational Therapy Evaluation Patient Details Name: [name] MRN: [id number] DOB: 05-06-1946 Today's Date: 07/31/2019    History of Present Illness 73 y.o. male with medical history significant for HFpEF (EF 60-65%), CAD s/p DES and CABG, hepatic cirrhosis, CKD stage III, type 2 diabetes, hyperlipidemia, asthma, anemia of chronic disease, and untreated OSA who is admitted with volume overload in setting of HFpEF   Clinical Impression   PT admitted with CHF and SOB. Pt currently with functional limitiations due to the deficits listed below (see OT problem list). Pt currently max (A) to stand pivot to chair with extensive effort by patient. Pt reports severe pain in bil LEs. Pt will need to progress to ability to do more than a stand pivot to complete basic transfer for home. Pt unsafe to d/c home at this current level. Pt also does not read or write per patient so all education needs to be teach back to ensure that he understands what is needed for his care.  Pt will benefit from skilled OT to increase their independence and safety with adls and balance to allow discharge SNF.     Follow Up Recommendations  SNF(pending progress with basic transfers and self care)    Equipment Recommendations  3 in 1 bedside commode;Wheelchair (measurements OT);Wheelchair cushion (measurements OT);Other (comment)(RW)    Recommendations for Other Services       Precautions / Restrictions Precautions Precautions: Fall Restrictions Weight Bearing Restrictions: No      Mobility Bed Mobility Overal bed mobility: Needs Assistance Bed Mobility: Rolling;Supine to Sit Rolling: Min assist(heavy use of bed rail)   Supine to sit: Mod assist;HOB elevated     General bed mobility comments: pt iwth HOB increased and educated to lateral lean to the right to progress L LE off EOB with pad. pt grimacing and expressing pain with movement due to LLE. pt reports "Oh these legs hurt equally as  bad"  Transfers Overall transfer level: Needs assistance Equipment used: Rolling walker (2 wheeled) Transfers: Sit to/from [facility] Sit to Stand: Max assist;From elevated surface Stand pivot transfers: Max assist;From elevated surface       General transfer comment: pt requires total (A) to progress RW and move to position. pt reliance on RW    Balance Overall balance assessment: Needs assistance Sitting-balance support: Bilateral upper extremity supported;Feet supported Sitting balance-Leahy Scale: Fair     Standing balance support: Bilateral upper extremity supported;During functional activity Standing balance-Leahy Scale: Poor Standing balance comment: reliance on RW                           ADL either performed or assessed with clinical judgement   ADL Overall ADL's : Needs assistance/impaired Eating/Feeding: Independent   Grooming: Minimal assistance;Sitting;Wash/dry face Grooming Details (indicate cue type and reason): static sitting eob for grooming task Upper Body Bathing: Minimal assistance   Lower Body Bathing: Total assistance;Bed level   Upper Body Dressing : Minimal assistance;Sitting   Lower Body Dressing: Total assistance;Bed level   Toilet Transfer: Maximal assistance;Stand-pivot Toilet Transfer Details (indicate cue type and reason): pt requires elevated surface to stand from eob. pt very painful. pt needed small tiny progressive steps toward L to pivot to chair. pt requires total (A) to move RW due to UE reliance on RW           General ADL Comments: pt agreeable and requesting OOB to chair. pt requires extensive help to exit bed and  stand pivot. pt is not ready to d/c home at this time.     Vision Baseline Vision/History: Wears glasses Wears Glasses: Reading only       Perception     Praxis      Pertinent Vitals/Pain Pain Assessment: Faces Faces Pain Scale: Hurts whole lot Pain Location: feet Pain  Descriptors / Indicators: Discomfort;Moaning;Stabbing Pain Intervention(s): Monitored during session;Repositioned     Hand Dominance Right   Extremity/Trunk Assessment Upper Extremity Assessment Upper Extremity Assessment: Overall WFL for tasks assessed   Lower Extremity Assessment Lower Extremity Assessment: Defer to PT evaluation;RLE deficits/detail;LLE deficits/detail RLE Deficits / Details: edema pitting  LLE Deficits / Details: edema pitting   Cervical / Trunk Assessment Cervical / Trunk Assessment: Normal   Communication Communication Communication: No difficulties   Cognition Arousal/Alertness: Awake/alert Behavior During Therapy: WFL for tasks assessed/performed Overall Cognitive Status: Within Functional Limits for tasks assessed                                 General Comments: pt states "why do they have me on TV? i am here?" Pt pointing to News with Richardson Landry name written on screen. pt expressed that he does not read or write. pt does however demonstrate recognition of his name during this session   General Comments  BIL LE edema noted. pt express L foot 2nd and 3rd toe pain but nothing visually seen by OT    Exercises Exercises: Other exercises Other Exercises Other Exercises: educated on ankle pumps, abduction and adduction of hips and knee flex and extension x10 reps bil LE   Shoulder Instructions      Home Living Family/patient expects to be discharged to:: Private residence Living Arrangements: Children(son and 2 kids 49 daughte 64 grandon and nephew 74 year ol) Available Help at Discharge: Family Type of Home: House Home Access: Stairs to enter Technical brewer of Steps: 2 Entrance Stairs-Rails: Left Home Layout: One level     Bathroom Shower/Tub: Teacher, early years/pre: Standard     Home Equipment: Clinical cytogeneticist - 2 wheels;Cane - single point   Additional Comments: enjoys yard work and striping cooper       Prior Functioning/Environment Level of Independence: Independent        Comments: started 2 weeks ago and has rapidly declined per patient.         OT Problem List: Decreased strength;Decreased range of motion;Decreased activity tolerance;Impaired balance (sitting and/or standing);Decreased safety awareness;Decreased knowledge of use of DME or AE;Decreased knowledge of precautions;Cardiopulmonary status limiting activity;Impaired sensation;Obesity;Pain;Increased edema      OT Treatment/Interventions: Self-care/ADL training;Therapeutic exercise;Neuromuscular education;Energy conservation;DME and/or AE instruction;Manual therapy;Modalities;Therapeutic activities;Patient/family education;Balance training    OT Goals(Current goals can be found in the care plan section) Acute Rehab OT Goals Patient Stated Goal: pt reports desire to walk OT Goal Formulation: With patient Time For Goal Achievement: 08/14/19 Potential to Achieve Goals: Good ADL Goals Pt Will Perform Upper Body Bathing: with supervision;sitting Pt Will Perform Lower Body Bathing: with min assist;with adaptive equipment;sit to/from stand Pt Will Transfer to Toilet: with min assist;ambulating;bedside commode Additional ADL Goal #1: pt will complete bed mobility supervision level for adls. Additional ADL Goal #2: pt will complete static standing adl task at sink supervision level  OT Frequency: Min 3X/week   Barriers to D/C: Decreased caregiver support          Co-evaluation  AM-PAC OT "6 Clicks" Daily Activity     Outcome Measure Help from another person eating meals?: None Help from another person taking care of personal grooming?: A Little Help from another person toileting, which includes using toliet, bedpan, or urinal?: A Lot Help from another person bathing (including washing, rinsing, drying)?: A Lot Help from another person to put on and taking off regular upper body clothing?: A Little Help  from another person to put on and taking off regular lower body clothing?: Total 6 Click Score: 15   End of Session Equipment Utilized During Treatment: Rolling walker Nurse Communication: Mobility status;Precautions  Activity Tolerance: Patient limited by pain Patient left: in chair;with call bell/phone within reach;with chair alarm set  OT Visit Diagnosis: Unsteadiness on feet (R26.81);Pain Pain - part of body: Leg                Time: 1683-7290 OT Time Calculation (min): 25 min Charges:  OT General Charges $OT Visit: 1 Visit OT Evaluation $OT Eval Moderate Complexity: 1 Mod OT Treatments $Self Care/Home Management : 8-22 mins   Brynn, OTR/L  Acute Rehabilitation Services Pager: [phone] Office: [phone] .   Jeri Modena 07/31/2019, 11:59 AM

## 2019-07-31 NOTE — NC FL2 (Signed)
[place] LEVEL OF CARE SCREENING TOOL     IDENTIFICATION  Patient Name: [name] Birthdate: 30-May-1946 Sex: male Admission Date (Current Location): 07/29/2019  [facility] and [place] Number:  Herbalist and Address:  [facility], [place] [address] [place], [place] [address]      Provider Number: [id number]  Attending Physician Name and Address:  Donne Hazel, MD  Relative Name and Phone Number:       Current Level of Care: Hospital Recommended Level of Care: [place] Prior Approval Number:    Date Approved/Denied:   PASRR Number: [id number]  Discharge Plan: SNF    Current Diagnoses: Patient Active Problem List   Diagnosis Date Noted  . Volume overload 07/29/2019  . Itching 03/22/2019  . Skin lesion 03/22/2019  . Hyponatremia 12/24/2018  . (HFpEF) heart failure with preserved ejection fraction ([place]) 12/24/2018  . Precordial chest pain   . Neck pain 09/12/2018  . CKD (chronic kidney disease), stage III 12/18/2017  . Symptomatic anemia 12/18/2017  . Peptic ulcer disease 12/18/2017  . GI bleed 09/29/2017  . SOB (shortness of breath) 09/21/2017  . Morbid obesity ([place]) 02/05/2016  . Numerous moles 09/15/2015  . S/P CABG x 3 03/31/2014  . Hepatic cirrhosis ([place]) 02/27/2014  . Acute blood loss anemia 08/13/2012  . Hyperlipidemia with target LDL less than 70 06/29/2012  . Thrombocytopenia ([place]) 06/29/2012  . Coronary artery disease    . OSA (obstructive sleep apnea)   . Type 2 diabetes with complication ([place]) 98/92/1194  . GERD 08/04/2010  . History of gout   . Hypertensive heart disease     Orientation RESPIRATION BLADDER Height & Weight     Self, Time, Situation, Place  Normal Continent, External catheter Weight: 249 lb 1.9 oz (113 kg) Height:  5\' 7"  (170.2 cm)  BEHAVIORAL SYMPTOMS/MOOD NEUROLOGICAL BOWEL NUTRITION STATUS      Continent Diet(see discharge summary)  AMBULATORY STATUS  COMMUNICATION OF NEEDS Skin   Extensive Assist Verbally Normal                       Personal Care Assistance Level of Assistance  Bathing, Feeding, Dressing Bathing Assistance: Maximum assistance Feeding assistance: Independent Dressing Assistance: Maximum assistance     Functional Limitations Info  Sight, Speech, Hearing Sight Info: Adequate Hearing Info: Adequate Speech Info: Adequate    SPECIAL CARE FACTORS FREQUENCY  PT (By licensed PT), OT (By licensed OT)     PT Frequency: 5x week OT Frequency: 5x week            Contractures Contractures Info: Not present    Additional Factors Info  Code Status, Allergies, Insulin Sliding Scale Code Status Info: Full Code Allergies Info: No Known Allergies   Insulin Sliding Scale Info: insulin aspart (novoLOG) injection 0-9 Units 3x daily with meals       Current Medications (07/31/2019):  This is the current hospital active medication list Current Facility-Administered Medications  Medication Dose Route Frequency Provider Last Rate Last Dose  . acetaminophen (TYLENOL) tablet 650 mg  650 mg Oral Q6H PRN Lenore Cordia, MD       Or  . acetaminophen (TYLENOL) suppository 650 mg  650 mg Rectal Q6H PRN Zada Finders R, MD      . albuterol (VENTOLIN HFA) 108 (90 Base) MCG/ACT inhaler 2 puff  2 puff Inhalation Q6H PRN Lenore Cordia, MD      .  enoxaparin (LOVENOX) injection 40 mg  40 mg Subcutaneous Q24H Alma Friendly, MD   40 mg at 07/31/19 1435  . ferrous sulfate tablet 325 mg  325 mg Oral Q supper Lenore Cordia, MD   325 mg at 07/31/19 1700  . furosemide (LASIX) injection 60 mg  60 mg Intravenous Q12H Zada Finders R, MD   60 mg at 07/31/19 1701  . gabapentin (NEURONTIN) capsule 100 mg  100 mg Oral BID Zada Finders R, MD   100 mg at 07/31/19 0900  . insulin aspart (novoLOG) injection 0-9 Units  0-9 Units Subcutaneous TID WC Lenore Cordia, MD   2 Units at 07/31/19 1701  . lactulose (CHRONULAC) 10 GM/15ML  solution 20 g  20 g Oral BID PRN Donne Hazel, MD      . oxyCODONE-acetaminophen (PERCOCET/ROXICET) 5-325 MG per tablet 1 tablet  1 tablet Oral Q6H PRN Omar Person, NP   1 tablet at 07/31/19 1435  . potassium chloride 20 MEQ/15ML (10%) solution 40 mEq  40 mEq Oral Daily Zada Finders R, MD   40 mEq at 07/31/19 0900  . sodium chloride flush (NS) 0.9 % injection 3 mL  3 mL Intravenous Once Davonna Belling, MD      . sodium chloride flush (NS) 0.9 % injection 3 mL  3 mL Intravenous Q12H Lenore Cordia, MD   3 mL at 07/31/19 0902  . spironolactone (ALDACTONE) tablet 50 mg  50 mg Oral Daily Zada Finders R, MD   50 mg at 07/31/19 0900  . umeclidinium-vilanterol (ANORO ELLIPTA) 62.5-25 MCG/INH 1 puff  1 puff Inhalation Daily Lenore Cordia, MD   1 puff at 07/31/19 1133     Discharge Medications: Please see discharge summary for a list of discharge medications.  Relevant Imaging Results:  Relevant Lab Results:   Additional Information [id number] [place], [place]

## 2019-07-31 NOTE — Evaluation (Signed)
Physical Therapy Evaluation Patient Details Name: [name] MRN: [id number] DOB: [birth year] Today's Date: 07/31/2019   History of Present Illness  73 y.o. male with medical history significant for HFpEF (EF 60-65%), CAD s/p DES and CABG, hepatic cirrhosis, CKD stage III, type 2 diabetes, hyperlipidemia, asthma, anemia of chronic disease, and untreated OSA who is admitted with volume overload in setting of HFpEF  Clinical Impression  Pt is a 73 yo male admitted for above. Pt up in recliner upon arrival requesting to get back in bed. Pt had been sitting up in recliner for several hours since OT eval this morning. Pt reported 10/10 B LE t/o session. Pt reported he had been trying to move his legs and do the exercises he learned in OT and demonstrating ankle pumps. Pt required heavy mod A to max A to stand from recliner with RW. Mod A to shuffle a few steps and perform stand pivot transfer to bed. Pt requires heavy use of B UE support of RW and needs assist for RW mgt. Pt able to scoot up in bed with increased time and effort and utilization of bed rails. Pt presents with decreased strength, ROM, balance, activity tolerance and increased pain limiting functional mobility. Pt would benefit from skilled acute PT to improve noted deficits. Recommendation for SNF after acute hospitalization depending on how pt progresses with therapy to improve independence, decrease caregiver burden and fall risk.     Follow Up Recommendations SNF    Equipment Recommendations  Rolling walker with 5" wheels    Recommendations for Other Services       Precautions / Restrictions Precautions Precautions: Fall Restrictions Weight Bearing Restrictions: No      Mobility  Bed Mobility Overal bed mobility: Needs Assistance Bed Mobility: Sit to Supine       Sit to supine: Min assist   General bed mobility comments: min A to return to supine, assist for LE advancement, pt utilizing bed rails and increased time  and effort, pt expressing pain with movement of LEs  Transfers Overall transfer level: Needs assistance Equipment used: Rolling walker (2 wheeled) Transfers: Sit to/from [facility] Sit to Stand: Mod assist;Max assist Stand pivot transfers: Mod assist       General transfer comment: mod-max A to stand from recliner, cuing for hand position, pt with heavy reliance on UE support from RW during transfers, min A for RW mgt during stand pivot  Ambulation/Gait             General Gait Details: deferred  Stairs            Wheelchair Mobility    Modified Rankin (Stroke Patients Only)       Balance Overall balance assessment: Needs assistance Sitting-balance support: Bilateral upper extremity supported;Feet supported Sitting balance-Leahy Scale: Fair Sitting balance - Comments: steady sitting EOB performing LE therex with UE support   Standing balance support: Bilateral upper extremity supported;During functional activity Standing balance-Leahy Scale: Poor Standing balance comment: heavy reliance on RW                             Pertinent Vitals/Pain Faces Pain Scale: Hurts worst Pain Location: B LE Pain Descriptors / Indicators: Aching;Grimacing;Moaning;Eisenberg Pain Intervention(s): Monitored during session;Repositioned;Limited activity within patient's tolerance    Home Living Family/patient expects to be discharged to:: Private residence Living Arrangements: Children Available Help at Discharge: Family Type of Home: House Home Access: Stairs to enter  Entrance Stairs-Rails: Left Entrance Stairs-Number of Steps: 2 Home Layout: One level Home Equipment: Clinical cytogeneticist - 2 wheels;Kasandra Knudsen - single point Additional Comments: enjoys yard work and striping cooper    Prior Function           Comments: started 2 weeks ago and has rapidly declined per patient.      Hand Dominance        Extremity/Trunk Assessment   Upper  Extremity Assessment Upper Extremity Assessment: Overall WFL for tasks assessed    Lower Extremity Assessment Lower Extremity Assessment: Generalized weakness(pt able to perform seated march and LAQ) RLE Deficits / Details: difficult to fully assess strength secondary to pain, pitting edema LLE Deficits / Details: difficult to fully assess strength secondary to pain, pitting edema       Communication   Communication: No difficulties  Cognition Arousal/Alertness: Awake/alert Behavior During Therapy: WFL for tasks assessed/performed Overall Cognitive Status: Within Functional Limits for tasks assessed                                        General Comments      Exercises Total Joint Exercises Long Arc Quad: AROM;Both;5 reps Marching in Standing: AROM;Both;5 reps;Seated   Assessment/Plan    PT Assessment Patient needs continued PT services  PT Problem List Decreased mobility;Decreased strength;Decreased range of motion;Decreased activity tolerance;Decreased balance;Pain;Decreased knowledge of use of DME       PT Treatment Interventions DME instruction;Gait training;Balance training;Therapeutic exercise;Stair training;Functional mobility training;Therapeutic activities;Patient/family education    PT Goals (Current goals can be found in the Care Plan section)  Acute Rehab PT Goals Patient Stated Goal: decrease pain PT Goal Formulation: With patient Time For Goal Achievement: 08/14/19 Potential to Achieve Goals: Fair    Frequency Min 3X/week   Barriers to discharge        Co-evaluation               AM-PAC PT "6 Clicks" Mobility  Outcome Measure Help needed turning from your back to your side while in a flat bed without using bedrails?: A Little Help needed moving from lying on your back to sitting on the side of a flat bed without using bedrails?: A Little Help needed moving to and from a bed to a chair (including a wheelchair)?: A Lot Help  needed standing up from a chair using your arms (e.g., wheelchair or bedside chair)?: A Lot Help needed to walk in hospital room?: A Lot Help needed climbing 3-5 steps with a railing? : Total 6 Click Score: 13    End of Session Equipment Utilized During Treatment: Gait belt Activity Tolerance: Patient limited by fatigue;Patient limited by pain Patient left: in bed;with call bell/phone within reach;with bed alarm set Nurse Communication: Mobility status PT Visit Diagnosis: Pain;Difficulty in walking, not elsewhere classified (R26.2);Muscle weakness (generalized) (M62.81) Pain - Right/Left: Left(B LE) Pain - part of body: Leg    Time: 3016-0109 PT Time Calculation (min) (ACUTE ONLY): 13 min   Charges:   PT Evaluation $PT Eval Moderate Complexity: 1 Mod          [name] PT, DPT 3:50 PM,07/31/19   [name] 07/31/2019, 3:46 PM

## 2019-08-01 LAB — CBC WITH DIFFERENTIAL/PLATELET
Abs Immature Granulocytes: 0.02 10*3/uL (ref 0.00–0.07)
Basophils Absolute: 0 10*3/uL (ref 0.0–0.1)
Basophils Relative: 1 %
Eosinophils Absolute: 0.1 10*3/uL (ref 0.0–0.5)
Eosinophils Relative: 2 %
HCT: 30.5 % — ABNORMAL LOW (ref 39.0–52.0)
Hemoglobin: 9 g/dL — ABNORMAL LOW (ref 13.0–17.0)
Immature Granulocytes: 0 %
Lymphocytes Relative: 16 %
Lymphs Abs: 0.9 10*3/uL (ref 0.7–4.0)
MCH: 24.9 pg — ABNORMAL LOW (ref 26.0–34.0)
MCHC: 29.5 g/dL — ABNORMAL LOW (ref 30.0–36.0)
MCV: 84.3 fL (ref 80.0–100.0)
Monocytes Absolute: 0.5 10*3/uL (ref 0.1–1.0)
Monocytes Relative: 9 %
Neutro Abs: 4 10*3/uL (ref 1.7–7.7)
Neutrophils Relative %: 72 %
Platelets: 149 10*3/uL — ABNORMAL LOW (ref 150–400)
RBC: 3.62 MIL/uL — ABNORMAL LOW (ref 4.22–5.81)
RDW: 15 % (ref 11.5–15.5)
WBC: 5.6 10*3/uL (ref 4.0–10.5)
nRBC: 0 % (ref 0.0–0.2)

## 2019-08-01 LAB — BASIC METABOLIC PANEL
Anion gap: 13 (ref 5–15)
BUN: 22 mg/dL (ref 8–23)
CO2: 28 mmol/L (ref 22–32)
Calcium: 8.4 mg/dL — ABNORMAL LOW (ref 8.9–10.3)
Chloride: 94 mmol/L — ABNORMAL LOW (ref 98–111)
Creatinine, Ser: 1.32 mg/dL — ABNORMAL HIGH (ref 0.61–1.24)
GFR calc Af Amer: >60 mL/min (ref >60)
GFR calc non Af Amer: 53 mL/min — ABNORMAL LOW (ref >60)
Glucose, Bld: 148 mg/dL — ABNORMAL HIGH (ref 70–99)
Potassium: 4.3 mmol/L (ref 3.5–5.1)
Sodium: 135 mmol/L (ref 135–145)

## 2019-08-01 LAB — GLUCOSE, CAPILLARY
Glucose-Capillary: 149 mg/dL — ABNORMAL HIGH (ref 70–99)
Glucose-Capillary: 151 mg/dL — ABNORMAL HIGH (ref 70–99)
Glucose-Capillary: 150 mg/dL — ABNORMAL HIGH (ref 70–99)
Glucose-Capillary: 170 mg/dL — ABNORMAL HIGH (ref 70–99)

## 2019-08-01 NOTE — TOC Initial Note (Signed)
Transition of Care [facility]) - Initial/Assessment Note    Patient Details  Name: [name] MRN: [id number] Date of Birth: Jan 15, 1946  Transition of [facility] CM/SW Contact:    Alexander Mt, [place] Phone Number: 08/01/2019, 5:04 PM  Clinical Narrative:                 CSW spoke with pt at bedside. Introduced self, role, reason for visit. Pt from home with his spouse, adult son, and grandchildren. Pt usually uses his walker to get around the home. He states his son helps him as needed. Pt also confirms home address and PCP.   He has had home health in the past and so has his wife. CSW witnessed pt ambulating to and from bathroom with walker; pt states he does not really want to go to SNF and feels like he can return home if possible. Pt gives permission to speak with his wife and his son as needed.   TOC team continues to follow.   Expected Discharge Plan: [place] Barriers to Discharge: Continued Medical Work up   Patient Goals and CMS Choice   CMS Medicare.gov Compare Post Acute Care list provided to:: Patient Choice offered to / list presented to : Patient  Expected Discharge Plan and Services Expected Discharge Plan: [place] In-house Referral: Clinical Social Work Discharge Planning Services: CM Consult Post Acute Care Choice: [place] arrangements for the past 2 months: Single Family Home     Prior Living Arrangements/Services Living arrangements for the past 2 months: Single Family Home Lives with:: Relatives, Adult Children, Spouse Patient language and need for interpreter reviewed:: Yes Do you feel safe going back to the place where you live?: Yes      Need for Family Participation in Patient Care: Yes (Comment)(assistance/supervision as needed) Care giver support system in place?: Yes (comment)(pt son/spouse)   Criminal Activity/Legal Involvement Pertinent to Current Situation/Hospitalization: No - Comment as  needed  Activities of Daily Living Home Assistive Devices/Equipment: None ADL Screening (condition at time of admission) Patient's cognitive ability adequate to safely complete daily activities?: Yes Is the patient deaf or have difficulty hearing?: Yes(HOH sometimes) Does the patient have difficulty seeing, even when wearing glasses/contacts?: No Does the patient have difficulty concentrating, remembering, or making decisions?: No Patient able to express need for assistance with ADLs?: Yes Does the patient have difficulty dressing or bathing?: Yes Independently performs ADLs?: No Communication: Independent Dressing (OT): Needs assistance Is this a change from baseline?: Pre-admission baseline Grooming: Independent Feeding: Independent Bathing: Needs assistance Is this a change from baseline?: Pre-admission baseline Toileting: Needs assistance Is this a change from baseline?: Pre-admission baseline In/Out Bed: Needs assistance Is this a change from baseline?: Pre-admission baseline Walks in Home: Needs assistance Is this a change from baseline?: Pre-admission baseline Does the patient have difficulty walking or climbing stairs?: Yes Weakness of Legs: Both Weakness of Arms/Hands: None  Permission Sought/Granted Permission sought to share information with : Family Supports Permission granted to share information with : Yes, Verbal Permission Granted  Share Information with NAME: Lattie Cervi  Permission granted to share info w AGENCY: [facility]/SNFs/HH  Permission granted to share info w Relationship: son  Permission granted to share info w Contact Information: [phone]  Emotional Assessment Appearance:: Appears stated age Attitude/Demeanor/Rapport: Engaged Affect (typically observed): Blunt, Accepting Orientation: : Oriented to Self, Oriented to Place, Oriented to Situation, Oriented to  Time Alcohol / Substance Use: Not Applicable Psych  Involvement: No  (comment)  Admission diagnosis:  Fever, unspecified fever cause [R50.9] Congestive heart failure, unspecified HF chronicity, unspecified heart failure type [facility]) [I50.9] Patient Active Problem List   Diagnosis Date Noted  . Volume overload 07/29/2019  . Itching 03/22/2019  . Skin lesion 03/22/2019  . Hyponatremia 12/24/2018  . (HFpEF) heart failure with preserved ejection fraction ([place]) 12/24/2018  . Precordial chest pain   . Neck pain 09/12/2018  . CKD (chronic kidney disease), stage III 12/18/2017  . Symptomatic anemia 12/18/2017  . Peptic ulcer disease 12/18/2017  . GI bleed 09/29/2017  . SOB (shortness of breath) 09/21/2017  . Morbid obesity ([place]) 02/05/2016  . Numerous moles 09/15/2015  . S/P CABG x 3 03/31/2014  . Hepatic cirrhosis ([place]) 02/27/2014  . Acute blood loss anemia 08/13/2012  . Hyperlipidemia with target LDL less than 70 06/29/2012  . Thrombocytopenia ([place]) 06/29/2012  . Coronary artery disease    . OSA (obstructive sleep apnea)   . Type 2 diabetes with complication ([place]) 16/02/3709  . GERD 08/04/2010  . History of gout   . Hypertensive heart disease    PCP:  [name], MD Pharmacy:   [facility] [address], [place] AT [place] [address] Phone: [phone] Fax: [phone]     Social Determinants of Health ([place]) Interventions    Readmission Risk Interventions Readmission Risk Prevention Plan 08/01/2019  Transportation Screening Complete  PCP or Specialist Appt within 5-7 Days Not Complete  Not Complete comments disposition pending  Home Care Screening Complete  Medication Review (RN CM) Complete  Some recent data might be hidden

## 2019-08-01 NOTE — Progress Notes (Signed)
PROGRESS NOTE    [name]  FIE:[id number] DOB: July 26, 1946 DOA: 07/29/2019 PCP: [name], MD    Brief Narrative:  73 y.o.malewith medical history significant forHFpEF (EF 60-65%), CAD s/p DES and CABG, hepatic cirrhosis, CKD stage III, type 2 diabetes, hyperlipidemia, asthma, anemia of chronic disease, anduntreatedOSA who presents to the ED for evaluation of volume overload, dyspnea, fatigue, weight gain, and swelling in both lower extremities. Portable chest x-ray showed moderate pulmonary edema. Patient reports continued progressive swelling in both of his legs. He has been having increased pain and difficulty walking now. In the ED, vital signs fairly stable, saturating well on room air.  Covid test negative.  Patient admitted for further management.  Assessment & Plan:   Principal Problem:   Volume overload Active Problems:   Type 2 diabetes with complication (HCC)   Coronary artery disease    Hyperlipidemia with target LDL less than 70   Hepatic cirrhosis (HCC)   S/P CABG x 3   CKD (chronic kidney disease), stage III   (HFpEF) heart failure with preserved ejection fraction (HCC)  Acute on chronic diastolic HF -Clinically volume overloaded however is improving -Presenting BNP 223 -Will continue IV Lasix 60 mg twice daily -Monitor strict I/O's, daily weights -Bilateral Doppler personally reviewed, no evidence of DVT -PT/OT eval -Repeat 2d echo reviewed with EF of 55-60% -thus far net neg just under 7L -Continue diuresis as tolerated. Repeat bmet in AM  CAD s/p CABG and DES -Chest pain-free -Not on antiplatelet therapy due to history of upper GI bleed -Seems stable at this time  Hepatic cirrhosis -Wihtout evidence of acute decompensation or encephalopathy -Continue spironolactone plus IV Lasix as tolerated -Will follow LFT's  Type 2 diabetes mellitus -Last A1c 6.1 -Continue SSI, Accu-Cheks, hypoglycemic protocol -Continuing to hold home  Metformin while in hospital  Anemia of chronic disease/History ofupperGI bleed -Hemoglobin at baseline -Follow CBC trends  Hypertension -Continue home spironolactone -continue Lasix as tolerated  CKD stage III -Creatinine at baseline -Daily BMP  Asthma: -Continue home Anoro Ellipta and as needed albuterol -Remains stable at this time  OSA -Continue nocturnal supplemental O2 via [place].  DVT prophylaxis: lovenox subQ Code Status: Full Family Communication: Pt in room, family not at bedside Disposition Plan: Uncertain at this time  Consultants:     Procedures:     Antimicrobials: Anti-infectives (From admission, onward)   Start     Dose/Rate Route Frequency Ordered Stop   07/29/19 1815  doxycycline (VIBRA-TABS) tablet 100 mg     100 mg Oral  Once 07/29/19 1806 07/29/19 1841      Subjective: States feeling better, however still with generalized swelling   Objective: Vitals:   07/31/19 2326 08/01/19 0500 08/01/19 0532 08/01/19 0815  BP: (!) 147/45  (!) 143/63   Pulse: 73  72   Resp: 16  16   Temp: 98.4 F (36.9 C)  98.4 F (36.9 C)   TempSrc: Oral  Oral   SpO2: 98%  98% 99%  Weight:  112.9 kg    Height:        Intake/Output Summary (Last 24 hours) at 08/01/2019 1527 Last data filed at 08/01/2019 0850 Gross per 24 hour  Intake 120 ml  Output 2775 ml  Net -2655 ml   Filed Weights   07/29/19 1205 07/31/19 0518 08/01/19 0500  Weight: 104.3 kg 113 kg 112.9 kg    Examination: General exam: Awake, laying in bed, in nad Respiratory system: Normal respiratory effort, no wheezing  Cardiovascular system: regular rate, s1, s2 Gastrointestinal system: Soft, nondistended, positive BS Central nervous system: CN2-12 grossly intact, strength intact Extremities: Perfused, no clubbing Skin: Normal skin turgor, no notable skin lesions seen Psychiatry: Mood normal // no visual hallucinations   Data Reviewed: I have personally reviewed following labs and  imaging studies  CBC: Recent Labs  Lab 07/27/19 1257 07/29/19 1218 07/30/19 0327 07/31/19 0509 08/01/19 0558  WBC 4.0 5.4 5.5 5.8 5.6  NEUTROABS 3.0 4.3  --  4.3 4.0  HGB 8.9* 9.0* 9.4* 8.7* 9.0*  HCT 30.6* 30.0* 31.2* 29.3* 30.5*  MCV 88.2 86.2 85.7 84.4 84.3  PLT 124* 135* 142* 138* 573*   Basic Metabolic Panel: Recent Labs  Lab 07/27/19 1257 07/29/19 1218 07/30/19 0327 07/31/19 0509 08/01/19 0558  NA 137 137 139 135 135  K 3.7 3.5 3.5 3.7 4.3  CL 105 101 100 97* 94*  CO2 23 25 26 26 28   GLUCOSE 216* 177* 80 117* 148*  BUN 20 17 18 19 22   CREATININE 1.25* 1.23 1.21 1.24 1.32*  CALCIUM 8.6* 8.4* 8.4* 8.3* 8.4*  MG  --   --  1.9  --   --    GFR: Estimated Creatinine Clearance: 59.8 mL/min (A) (by C-G formula based on SCr of 1.32 mg/dL (H)). Liver Function Tests: Recent Labs  Lab 07/27/19 1257 07/29/19 1218  AST 28 25  ALT 20 20  ALKPHOS 107 96  BILITOT 0.9 0.6  PROT 6.6 7.1  ALBUMIN 3.4* 3.4*   No results for input(s): LIPASE, AMYLASE in the last 168 hours. No results for input(s): AMMONIA in the last 168 hours. Coagulation Profile: No results for input(s): INR, PROTIME in the last 168 hours. Cardiac Enzymes: No results for input(s): CKTOTAL, CKMB, CKMBINDEX, TROPONINI in the last 168 hours. BNP (last 3 results) No results for input(s): PROBNP in the last 8760 hours. HbA1C: Recent Labs    07/29/19 2055  HGBA1C 6.1*   CBG: Recent Labs  Lab 07/31/19 1110 07/31/19 1630 07/31/19 2140 08/01/19 0547 08/01/19 1108  GLUCAP 152* 180* 203* 149* 151*   Lipid Profile: No results for input(s): CHOL, HDL, LDLCALC, TRIG, CHOLHDL, LDLDIRECT in the last 72 hours. Thyroid Function Tests: No results for input(s): TSH, T4TOTAL, FREET4, T3FREE, THYROIDAB in the last 72 hours. Anemia Panel: No results for input(s): VITAMINB12, FOLATE, FERRITIN, TIBC, IRON, RETICCTPCT in the last 72 hours. Sepsis Labs: Recent Labs  Lab 07/29/19 1218  LATICACIDVEN 1.8     Recent Results (from the past 240 hour(s))  SARS CORONAVIRUS 2 (TAT 6-24 HRS) Nasopharyngeal Nasopharyngeal Swab     Status: None   Collection Time: 07/29/19  6:06 PM   Specimen: Nasopharyngeal Swab  Result Value Ref Range Status   SARS Coronavirus 2 NEGATIVE NEGATIVE Final    Comment: (NOTE) SARS-CoV-2 target nucleic acids are NOT DETECTED. The SARS-CoV-2 RNA is generally detectable in upper and lower respiratory specimens during the acute phase of infection. Negative results do not preclude SARS-CoV-2 infection, do not rule out co-infections with other pathogens, and should not be used as the sole basis for treatment or other patient management decisions. Negative results must be combined with clinical observations, patient history, and epidemiological information. The expected result is Negative. Fact Sheet for Patients: SugarRoll.be Fact Sheet for Healthcare Providers: https://www.woods-mathews.com/ This test is not yet approved or cleared by the [place] FDA and  has been authorized for detection and/or diagnosis of SARS-CoV-2 by FDA under an Emergency Use Authorization (EUA). This EUA  will remain  in effect (meaning this test can be used) for the duration of the COVID-19 declaration under Section 56 4(b)(1) of the Act, 21 U.S.C. section 360bbb-3(b)(1), unless the authorization is terminated or revoked sooner. Performed at [facility], [place] [address] [place], [place] [address]      Radiology Studies: Vas [place] Lower Extremity Venous (dvt)  Result Date: 07/30/2019  Lower Venous Study Indications: Edema.  Limitations: Body habitus. Comparison Study: 11/20/17 negative Performing Technologist: June Leap RDMS, RVT  Examination Guidelines: A complete evaluation includes B-mode imaging, spectral Doppler, color Doppler, and power Doppler as needed of all accessible portions of each vessel. Bilateral testing is considered an integral  part of a complete examination. Limited examinations for reoccurring indications may be performed as noted.  +---------+---------------+---------+-----------+----------+-------------------+ RIGHT    CompressibilityPhasicitySpontaneityPropertiesThrombus Aging      +---------+---------------+---------+-----------+----------+-------------------+ CFV      Full           Yes      Yes                                      +---------+---------------+---------+-----------+----------+-------------------+ SFJ      Full                                                             +---------+---------------+---------+-----------+----------+-------------------+ FV Prox  Full                                                             +---------+---------------+---------+-----------+----------+-------------------+ FV Mid   Full                                                             +---------+---------------+---------+-----------+----------+-------------------+ FV Distal               Yes      Yes                                      +---------+---------------+---------+-----------+----------+-------------------+ PFV      Full                                                             +---------+---------------+---------+-----------+----------+-------------------+ POP      Full           Yes      Yes                                      +---------+---------------+---------+-----------+----------+-------------------+  PTV      Full                                         not well visualized +---------+---------------+---------+-----------+----------+-------------------+ PERO     Full                                         not well visualized +---------+---------------+---------+-----------+----------+-------------------+   Right Technical Findings: Pulsatile venous flow suggestive of increased right side heart pressure.   +---------+---------------+---------+-----------+----------+-------------------+ LEFT     CompressibilityPhasicitySpontaneityPropertiesThrombus Aging      +---------+---------------+---------+-----------+----------+-------------------+ CFV      Full                                                             +---------+---------------+---------+-----------+----------+-------------------+ SFJ      Full                                                             +---------+---------------+---------+-----------+----------+-------------------+ FV Prox  Full                                                             +---------+---------------+---------+-----------+----------+-------------------+ FV Mid   Full                                                             +---------+---------------+---------+-----------+----------+-------------------+ FV DistalFull                                                             +---------+---------------+---------+-----------+----------+-------------------+ PFV      Full                                                             +---------+---------------+---------+-----------+----------+-------------------+ POP                     Yes      Yes                                      +---------+---------------+---------+-----------+----------+-------------------+ PTV  Full                                         not well visualized +---------+---------------+---------+-----------+----------+-------------------+ PERO     Full                                         not well visualized +---------+---------------+---------+-----------+----------+-------------------+   Left Technical Findings: Pulsatile venous flow suggestive of increased right side heart pressure.   Summary: Right: There is no evidence of deep vein thrombosis proximal to the inguinal ligament or in the common femoral vein. No cystic structure  found in the popliteal fossa. Left: There is no evidence of deep vein thrombosis proximal to the inguinal ligament or in the common femoral vein. A cystic structure is found in the popliteal fossa.  *See table(s) above for measurements and observations. Electronically signed by Deitra Mayo MD on 07/30/2019 at 5:38:24 PM.    Final     Scheduled Meds: . enoxaparin (LOVENOX) injection  40 mg Subcutaneous Q24H  . ferrous sulfate  325 mg Oral Q supper  . furosemide  60 mg Intravenous Q12H  . gabapentin  100 mg Oral BID  . insulin aspart  0-9 Units Subcutaneous TID WC  . potassium chloride  40 mEq Oral Daily  . sodium chloride flush  3 mL Intravenous Once  . sodium chloride flush  3 mL Intravenous Q12H  . spironolactone  50 mg Oral Daily  . umeclidinium-vilanterol  1 puff Inhalation Daily   Continuous Infusions:   LOS: 2 days   Marylu Lund, MD [facility] Pager On Amion  If 7PM-7AM, please contact night-coverage 08/01/2019, 3:27 PM

## 2019-08-02 DIAGNOSIS — R509 Fever, unspecified: Secondary | ICD-10-CM

## 2019-08-02 LAB — CBC WITH DIFFERENTIAL/PLATELET
Abs Immature Granulocytes: 0.01 10*3/uL (ref 0.00–0.07)
Basophils Absolute: 0 10*3/uL (ref 0.0–0.1)
Basophils Relative: 1 %
Eosinophils Absolute: 0.2 10*3/uL (ref 0.0–0.5)
Eosinophils Relative: 4 %
HCT: 29 % — ABNORMAL LOW (ref 39.0–52.0)
Hemoglobin: 8.5 g/dL — ABNORMAL LOW (ref 13.0–17.0)
Immature Granulocytes: 0 %
Lymphocytes Relative: 17 %
Lymphs Abs: 0.7 10*3/uL (ref 0.7–4.0)
MCH: 24.6 pg — ABNORMAL LOW (ref 26.0–34.0)
MCHC: 29.3 g/dL — ABNORMAL LOW (ref 30.0–36.0)
MCV: 84.1 fL (ref 80.0–100.0)
Monocytes Absolute: 0.4 10*3/uL (ref 0.1–1.0)
Monocytes Relative: 10 %
Neutro Abs: 2.8 10*3/uL (ref 1.7–7.7)
Neutrophils Relative %: 68 %
Platelets: 159 10*3/uL (ref 150–400)
RBC: 3.45 MIL/uL — ABNORMAL LOW (ref 4.22–5.81)
RDW: 14.9 % (ref 11.5–15.5)
WBC: 4.2 10*3/uL (ref 4.0–10.5)
nRBC: 0 % (ref 0.0–0.2)

## 2019-08-02 LAB — COMPREHENSIVE METABOLIC PANEL
ALT: 20 U/L (ref 0–44)
AST: 24 U/L (ref 15–41)
Albumin: 3 g/dL — ABNORMAL LOW (ref 3.5–5.0)
Alkaline Phosphatase: 125 U/L (ref 38–126)
Anion gap: 10 (ref 5–15)
BUN: 24 mg/dL — ABNORMAL HIGH (ref 8–23)
CO2: 26 mmol/L (ref 22–32)
Calcium: 8.4 mg/dL — ABNORMAL LOW (ref 8.9–10.3)
Chloride: 97 mmol/L — ABNORMAL LOW (ref 98–111)
Creatinine, Ser: 1.33 mg/dL — ABNORMAL HIGH (ref 0.61–1.24)
GFR calc Af Amer: >60 mL/min (ref >60)
GFR calc non Af Amer: 53 mL/min — ABNORMAL LOW (ref >60)
Glucose, Bld: 133 mg/dL — ABNORMAL HIGH (ref 70–99)
Potassium: 4.1 mmol/L (ref 3.5–5.1)
Sodium: 133 mmol/L — ABNORMAL LOW (ref 135–145)
Total Bilirubin: 0.7 mg/dL (ref 0.3–1.2)
Total Protein: 6.9 g/dL (ref 6.5–8.1)

## 2019-08-02 LAB — GLUCOSE, CAPILLARY
Glucose-Capillary: 151 mg/dL — ABNORMAL HIGH (ref 70–99)
Glucose-Capillary: 179 mg/dL — ABNORMAL HIGH (ref 70–99)
Glucose-Capillary: 139 mg/dL — ABNORMAL HIGH (ref 70–99)

## 2019-08-02 NOTE — TOC Progression Note (Signed)
Transition of Care [facility]) - Progression Note    Patient Details  Name: RAYWOOD WAILES MRN: [id number] Date of Birth: Apr 29, 1946  Transition of Care [facility]) CM/SW [place], [place] Phone Number: 08/02/2019, 2:40 PM  Clinical Narrative:    CSW called and spoke with pt son [name] via telephone [phone]). We discussed recommendations, pt son not very engaged in conversation- kept saying "I'm just concerned with them getting that fluid off." CSW explained that I wanted to call and gauge pt son willingness to assist pt at home. Pt son states he will provide care if pt continues to refuse SNF placement. Unable to elicit more engagement, let pt son know we would be calling again to discuss discharge planning- pt son amenable.   TOC team will continue to follow.   Expected Discharge Plan: [place] Barriers to Discharge: Continued Medical Work up  Expected Discharge Plan and Services Expected Discharge Plan: [place] In-house Referral: Clinical Social Work Discharge Planning Services: CM Consult Post Acute Care Choice: [place] arrangements for the past 2 months: Single Family Home  Readmission Risk Interventions Readmission Risk Prevention Plan 08/01/2019  Transportation Screening Complete  PCP or Specialist Appt within 5-7 Days Not Complete  Not Complete comments disposition pending  Home Care Screening Complete  Medication Review (RN CM) Complete  Some recent data might be hidden

## 2019-08-02 NOTE — Progress Notes (Signed)
Physical Therapy Treatment Patient Details Name: [name] MRN: [id number] DOB: 1946/02/01 Today's Date: 08/02/2019    History of Present Illness 73 y.o. male with medical history significant for HFpEF (EF 60-65%), CAD s/p DES and CABG, hepatic cirrhosis, CKD stage III, type 2 diabetes, hyperlipidemia, asthma, anemia of chronic disease, and untreated OSA who is admitted with volume overload in setting of HFpEF    PT Comments    Patient received standing at sink washing hands with nurse tech providing supervision; PT took over session and patient able to walk approximately 107f to far side of bed with RW and min guard although very effortful and with close min guard for safety. Returned to bed with Min guard and extended time/increased effort. Attempted to engage patient in lower body exercises to assist in managing BLE edema however he declines due to being very fatigued due to/from bathroom. Education and assistance provided on LE elevation using bed features while in hospital bed, also about benefits of rehab at SNF prior to return home. He was left in bed with all needs met, bed alarm active. Continue to recommend SNF moving forward.     Follow Up Recommendations  SNF     Equipment Recommendations  Rolling walker with 5" wheels    Recommendations for Other Services       Precautions / Restrictions Precautions Precautions: Fall Restrictions Weight Bearing Restrictions: No    Mobility  Bed Mobility Overal bed mobility: Needs Assistance Bed Mobility: Sit to Supine       Sit to supine: Min guard   General bed mobility comments: Min guard today for LE management but very effortful and with increased time  Transfers Overall transfer level: Needs assistance Equipment used: Rolling walker (2 wheeled) Transfers: Sit to/from Stand Sit to Stand: Min guard         General transfer comment: min guard, extended time for stand to sit, good awarenes of safety during  transfer  Ambulation/Gait Ambulation/Gait assistance: Min guard Gait Distance (Feet): 12 Feet Assistive device: Rolling walker (2 wheeled) Gait Pattern/deviations: Step-through pattern;Decreased step length - right;Decreased step length - left;Decreased stride length;Decreased dorsiflexion - right;Decreased dorsiflexion - left;Decreased weight shift to left;Decreased weight shift to right;Antalgic Gait velocity: decreased   General Gait Details: slow and effortful but steady with RW, easily fatigued   Stairs             Wheelchair Mobility    Modified Rankin (Stroke Patients Only)       Balance Overall balance assessment: Needs assistance Sitting-balance support: Bilateral upper extremity supported;Feet supported Sitting balance-Leahy Scale: Good Sitting balance - Comments: S for safety   Standing balance support: Bilateral upper extremity supported;During functional activity Standing balance-Leahy Scale: Fair Standing balance comment: able to wash hands at sink with S for safety, intermittent UE support                            Cognition Arousal/Alertness: Awake/alert Behavior During Therapy: WFL for tasks assessed/performed Overall Cognitive Status: Within Functional Limits for tasks assessed                                        Exercises      General Comments        Pertinent Vitals/Pain Pain Assessment: No/denies pain Faces Pain Scale: Hurts little more Pain Location: B LE Pain  Descriptors / Indicators: Aching;Grimacing;Moaning;[place] Pain Intervention(s): Limited activity within patient's tolerance;Monitored during session;Repositioned    Home Living                      Prior Function            PT Goals (current goals can now be found in the care plan section) Acute Rehab PT Goals Patient Stated Goal: decrease pain PT Goal Formulation: With patient Time For Goal Achievement: 08/14/19 Potential to  Achieve Goals: Fair Progress towards PT goals: Progressing toward goals    Frequency           PT Plan Current plan remains appropriate    Co-evaluation              AM-PAC PT "6 Clicks" Mobility   Outcome Measure  Help needed turning from your back to your side while in a flat bed without using bedrails?: A Little Help needed moving from lying on your back to sitting on the side of a flat bed without using bedrails?: A Little Help needed moving to and from a bed to a chair (including a wheelchair)?: A Little Help needed standing up from a chair using your arms (e.g., wheelchair or bedside chair)?: A Little Help needed to walk in hospital room?: A Little Help needed climbing 3-5 steps with a railing? : A Lot 6 Click Score: 17    End of Session   Activity Tolerance: Patient limited by fatigue Patient left: in bed;with call bell/phone within reach;with bed alarm set   PT Visit Diagnosis: Pain;Difficulty in walking, not elsewhere classified (R26.2);Muscle weakness (generalized) (M62.81) Pain - Right/Left: Left(B LE) Pain - part of body: Leg     Time: 3419-3790 PT Time Calculation (min) (ACUTE ONLY): 10 min  Charges:  $Gait Training: 8-22 mins                     Windell Norfolk, DPT, PN1   Supplemental Physical Therapist [place]    Pager [phone] Acute Rehab Office [phone]

## 2019-08-02 NOTE — Progress Notes (Signed)
PROGRESS NOTE    [name]  KPT:[id number] DOB: 11/20/1945 DOA: 07/29/2019 PCP: [name], MD    Brief Narrative:  73 y.o.malewith medical history significant forHFpEF (EF 60-65%), CAD s/p DES and CABG, hepatic cirrhosis, CKD stage III, type 2 diabetes, hyperlipidemia, asthma, anemia of chronic disease, anduntreatedOSA who presents to the ED for evaluation of volume overload, dyspnea, fatigue, weight gain, and swelling in both lower extremities. Portable chest x-ray showed moderate pulmonary edema. Patient reports continued progressive swelling in both of his legs. He has been having increased pain and difficulty walking now. In the ED, vital signs fairly stable, saturating well on room air.  Covid test negative.  Patient admitted for further management.  Assessment & Plan:   Principal Problem:   Volume overload Active Problems:   Type 2 diabetes with complication (HCC)   Coronary artery disease    Hyperlipidemia with target LDL less than 70   Hepatic cirrhosis (HCC)   S/P CABG x 3   CKD (chronic kidney disease), stage III   (HFpEF) heart failure with preserved ejection fraction (HCC)  Acute on chronic diastolic HF -Clinically volume overloaded. -Presenting BNP 223 -Will continue IV Lasix 60 mg twice daily for now -Monitor strict I/O's, daily weights -Bilateral Doppler personally reviewed, no evidence of DVT -PT/OT eval -Repeat 2d echo reviewed with EF of 55-60% -thus far net neg around 7L. No significant wt change -Pt does have evidence of chronic venous skin changes, thus likely also component of chronic venous insufficiency -Will cont lasix for today, repeat bmet in AM. Possible transition to PO lasix in 24hrs  CAD s/p CABG and DES -Chest pain-free -Not on antiplatelet therapy due to history of upper GI bleed -Remains stable  Hepatic cirrhosis -Wihtout evidence of acute decompensation or encephalopathy -Continue spironolactone plus IV Lasix as  tolerated -LFT's remain stable at this time  Type 2 diabetes mellitus -Last A1c 6.1 -Continue SSI, Accu-Cheks, hypoglycemic protocol -Continuing to hold home Metformin while in hospital  Anemia of chronic disease/History ofupperGI bleed -Hemoglobin at baseline -Remains hemodynamically stable  Hypertension -Continue home spironolactone -continue Lasix as tolerated per above  CKD stage III -Creatinine at baseline, stable -Daily BMP  Asthma: -Continue home Anoro Ellipta and as needed albuterol -Remains stable at this time  OSA -Continue nocturnal supplemental O2 via [place].  DVT prophylaxis: lovenox subQ Code Status: Full Family Communication: Pt in room, family not at bedside Disposition Plan: Uncertain at this time  Consultants:     Procedures:     Antimicrobials: Anti-infectives (From admission, onward)   Start     Dose/Rate Route Frequency Ordered Stop   07/29/19 1815  doxycycline (VIBRA-TABS) tablet 100 mg     100 mg Oral  Once 07/29/19 1806 07/29/19 1841      Subjective: Still complaining of leg swelling that improves with elevating legs  Objective: Vitals:   08/02/19 0431 08/02/19 0526 08/02/19 0907 08/02/19 1203  BP: (!) 138/59 (!) 143/65  139/68  Pulse: 71 68  74  Resp: 20 16  16   Temp: 98.4 F (36.9 C) 98.6 F (37 C)  97.6 F (36.4 C)  TempSrc:  Oral  Oral  SpO2: 95% 94% 96% 99%  Weight:      Height:        Intake/Output Summary (Last 24 hours) at 08/02/2019 1441 Last data filed at 08/02/2019 0715 Gross per 24 hour  Intake 360 ml  Output 1200 ml  Net -840 ml   [employer]  07/29/19 1205 07/31/19 0518 08/01/19 0500  Weight: 104.3 kg 113 kg 112.9 kg    Examination: General exam: Conversant, in no acute distress Respiratory system: normal chest rise, clear, no audible wheezing Cardiovascular system: regular rhythm, s1-s2 Gastrointestinal system: Nondistended, nontender, pos BS Central nervous system: No seizures, no tremors  Extremities: No cyanosis, no joint deformities, BLE edema Skin: No rashes, no pallor Psychiatry: Affect normal // no auditory hallucinations   Data Reviewed: I have personally reviewed following labs and imaging studies  CBC: Recent Labs  Lab 07/27/19 1257 07/29/19 1218 07/30/19 0327 07/31/19 0509 08/01/19 0558 08/02/19 0517  WBC 4.0 5.4 5.5 5.8 5.6 4.2  NEUTROABS 3.0 4.3  --  4.3 4.0 2.8  HGB 8.9* 9.0* 9.4* 8.7* 9.0* 8.5*  HCT 30.6* 30.0* 31.2* 29.3* 30.5* 29.0*  MCV 88.2 86.2 85.7 84.4 84.3 84.1  PLT 124* 135* 142* 138* 149* 833   Basic Metabolic Panel: Recent Labs  Lab 07/29/19 1218 07/30/19 0327 07/31/19 0509 08/01/19 0558 08/02/19 0517  NA 137 139 135 135 133*  K 3.5 3.5 3.7 4.3 4.1  CL 101 100 97* 94* 97*  CO2 25 26 26 28 26   GLUCOSE 177* 80 117* 148* 133*  BUN 17 18 19 22  24*  CREATININE 1.23 1.21 1.24 1.32* 1.33*  CALCIUM 8.4* 8.4* 8.3* 8.4* 8.4*  MG  --  1.9  --   --   --    GFR: Estimated Creatinine Clearance: 59.3 mL/min (A) (by C-G formula based on SCr of 1.33 mg/dL (H)). Liver Function Tests: Recent Labs  Lab 07/27/19 1257 07/29/19 1218 08/02/19 0517  AST 28 25 24   ALT 20 20 20   ALKPHOS 107 96 125  BILITOT 0.9 0.6 0.7  PROT 6.6 7.1 6.9  ALBUMIN 3.4* 3.4* 3.0*   No results for input(s): LIPASE, AMYLASE in the last 168 hours. No results for input(s): AMMONIA in the last 168 hours. Coagulation Profile: No results for input(s): INR, PROTIME in the last 168 hours. Cardiac Enzymes: No results for input(s): CKTOTAL, CKMB, CKMBINDEX, TROPONINI in the last 168 hours. BNP (last 3 results) No results for input(s): PROBNP in the last 8760 hours. HbA1C: No results for input(s): HGBA1C in the last 72 hours. CBG: Recent Labs  Lab 08/01/19 0547 08/01/19 1108 08/01/19 1637 08/01/19 2204 08/02/19 1101  GLUCAP 149* 151* 150* 170* 151*   Lipid Profile: No results for input(s): CHOL, HDL, LDLCALC, TRIG, CHOLHDL, LDLDIRECT in the last 72 hours.  Thyroid Function Tests: No results for input(s): TSH, T4TOTAL, FREET4, T3FREE, THYROIDAB in the last 72 hours. Anemia Panel: No results for input(s): VITAMINB12, FOLATE, FERRITIN, TIBC, IRON, RETICCTPCT in the last 72 hours. Sepsis Labs: Recent Labs  Lab 07/29/19 1218  LATICACIDVEN 1.8    Recent Results (from the past 240 hour(s))  SARS CORONAVIRUS 2 (TAT 6-24 HRS) Nasopharyngeal Nasopharyngeal Swab     Status: None   Collection Time: 07/29/19  6:06 PM   Specimen: Nasopharyngeal Swab  Result Value Ref Range Status   SARS Coronavirus 2 NEGATIVE NEGATIVE Final    Comment: (NOTE) SARS-CoV-2 target nucleic acids are NOT DETECTED. The SARS-CoV-2 RNA is generally detectable in upper and lower respiratory specimens during the acute phase of infection. Negative results do not preclude SARS-CoV-2 infection, do not rule out co-infections with other pathogens, and should not be used as the sole basis for treatment or other patient management decisions. Negative results must be combined with clinical observations, patient history, and epidemiological information. The expected result is  Negative. Fact Sheet for Patients: SugarRoll.be Fact Sheet for Healthcare Providers: https://www.woods-mathews.com/ This test is not yet approved or cleared by the [place] FDA and  has been authorized for detection and/or diagnosis of SARS-CoV-2 by FDA under an Emergency Use Authorization (EUA). This EUA will remain  in effect (meaning this test can be used) for the duration of the COVID-19 declaration under Section 56 4(b)(1) of the Act, 21 U.S.C. section 360bbb-3(b)(1), unless the authorization is terminated or revoked sooner. Performed at [facility], [place] [address] [place], [place] [address]      Radiology Studies: No results found.  Scheduled Meds: . enoxaparin (LOVENOX) injection  40 mg Subcutaneous Q24H  . ferrous sulfate  325 mg Oral Q  supper  . furosemide  60 mg Intravenous Q12H  . gabapentin  100 mg Oral BID  . insulin aspart  0-9 Units Subcutaneous TID WC  . potassium chloride  40 mEq Oral Daily  . sodium chloride flush  3 mL Intravenous Once  . sodium chloride flush  3 mL Intravenous Q12H  . spironolactone  50 mg Oral Daily  . umeclidinium-vilanterol  1 puff Inhalation Daily   Continuous Infusions:   LOS: 3 days   Marylu Lund, MD [facility] Pager On Amion  If 7PM-7AM, please contact night-coverage 08/02/2019, 2:41 PM

## 2019-08-02 NOTE — Progress Notes (Signed)
Occupational Therapy Treatment Patient Details Name: [name] MRN: [id number] DOB: 28-Apr-1946 Today's Date: 08/02/2019    History of present illness 73 y.o. male with medical history significant for HFpEF (EF 60-65%), CAD s/p DES and CABG, hepatic cirrhosis, CKD stage III, type 2 diabetes, hyperlipidemia, asthma, anemia of chronic disease, and untreated OSA who is admitted with volume overload in setting of HFpEF   OT comments  Patient semi-supine in bed upon arrival, agreeable to OT. Patient require min A for bed mobility and min/mod A to stand due to weakness/fatigue as patient reports he just got back into bed after using the bathroom. Patient require increase time for ambulation to sink for grooming/hygiene, UB bathing min guard assist for safety as patient reporting pain in L knee and foot. Patient agreeable to sitting up in chair, requiring min A due to limited eccentric control into chair.    Follow Up Recommendations  SNF    Equipment Recommendations  3 in 1 bedside commode;Other (comment)(RW)       Precautions / Restrictions Precautions Precautions: Fall Restrictions Weight Bearing Restrictions: No       Mobility Bed Mobility Overal bed mobility: Needs Assistance Bed Mobility: Sit to Supine     Supine to sit: Min assist;HOB elevated Sit to supine: Min guard   General bed mobility comments: Min guard today for LE management but very effortful and with increased time  Transfers Overall transfer level: Needs assistance Equipment used: Rolling walker (2 wheeled) Transfers: Sit to/from Stand Sit to Stand: Min assist;Mod assist         General transfer comment: min guard, extended time for stand to sit, good awarenes of safety during transfer    Balance Overall balance assessment: Needs assistance Sitting-balance support: Feet supported;Bilateral upper extremity supported Sitting balance-Leahy Scale: Fair Sitting balance - Comments: S for safety   Standing  balance support: Bilateral upper extremity supported;During functional activity Standing balance-Leahy Scale: Poor Standing balance comment: reliance on RW for balance and to alleviate LE pain pushing through UEs                            ADL either performed or assessed with clinical judgement   ADL Overall ADL's : Needs assistance/impaired     Grooming: Min guard;Oral care;Wash/dry face;Standing   Upper Body Bathing: Min guard;Standing Upper Body Bathing Details (indicate cue type and reason): patient feeling fatigued after standing at sink for grooming/hygiene and UB bathing, limited activity tolerance and pain in L knee              Toilet Transfer: Ambulation;Minimal assistance;Moderate assistance Toilet Transfer Details (indicate cue type and reason): simulated with sit to stand EOB and transfer to bedside chair, decreased strength and eccentric control when sitting         Functional mobility during ADLs: Minimal assistance;Rolling walker General ADL Comments: patient requires increase time for all mobility due to pain in legs and significant swelling               Cognition Arousal/Alertness: Awake/alert Behavior During Therapy: WFL for tasks assessed/performed Overall Cognitive Status: Within Functional Limits for tasks assessed                                          Exercises Exercises: General Lower Extremity General Exercises - Lower Extremity Ankle Circles/Pumps: 10  reps;Supine Heel Slides: 10 reps;Supine      General Comments bilateral LE edema, patient reporting L foot hurting more than R    Pertinent Vitals/ Pain       Pain Assessment: 0-10 Pain Score: 7  Faces Pain Scale: Hurts little more Pain Location: L knee and foot Pain Descriptors / Indicators: Grimacing;Aching Pain Intervention(s): Limited activity within patient's tolerance;Monitored during session;Repositioned      Frequency  Min 3X/week         Progress Toward Goals  OT Goals(current goals can now be found in the care plan section)  Progress towards OT goals: Progressing toward goals  Acute Rehab OT Goals Patient Stated Goal: decrease swelling OT Goal Formulation: With patient Time For Goal Achievement: 08/14/19 Potential to Achieve Goals: Good ADL Goals Pt Will Perform Upper Body Bathing: with supervision;sitting Pt Will Perform Lower Body Bathing: with min assist;with adaptive equipment;sit to/from stand Pt Will Transfer to Toilet: with supervision;ambulating;bedside commode Additional ADL Goal #1: pt will complete bed mobility supervision level for adls. Additional ADL Goal #2: pt will complete static standing adl task at sink supervision level  Plan Discharge plan remains appropriate       AM-PAC OT "6 Clicks" Daily Activity     Outcome Measure   Help from another person eating meals?: None Help from another person taking care of personal grooming?: A Little Help from another person toileting, which includes using toliet, bedpan, or urinal?: A Lot Help from another person bathing (including washing, rinsing, drying)?: A Lot Help from another person to put on and taking off regular upper body clothing?: A Little Help from another person to put on and taking off regular lower body clothing?: Total 6 Click Score: 15    End of Session Equipment Utilized During Treatment: Rolling walker  OT Visit Diagnosis: Unsteadiness on feet (R26.81);Pain Pain - Right/Left: (bilateral) Pain - part of body: Leg   Activity Tolerance Patient tolerated treatment well   Patient Left in chair;with call bell/phone within reach;with chair alarm set   Nurse Communication Mobility status        Time: 6770-3403 OT Time Calculation (min): 28 min  Charges: OT General Charges $OT Visit: 1 Visit OT Treatments $Self Care/Home Management : 23-37 mins  Shon Millet OT OT office: [place] 08/02/2019, 12:36  PM

## 2019-08-03 LAB — BASIC METABOLIC PANEL
Anion gap: 10 (ref 5–15)
BUN: 28 mg/dL — ABNORMAL HIGH (ref 8–23)
CO2: 27 mmol/L (ref 22–32)
Calcium: 8.6 mg/dL — ABNORMAL LOW (ref 8.9–10.3)
Chloride: 99 mmol/L (ref 98–111)
Creatinine, Ser: 1.27 mg/dL — ABNORMAL HIGH (ref 0.61–1.24)
GFR calc Af Amer: >60 mL/min (ref >60)
GFR calc non Af Amer: 56 mL/min — ABNORMAL LOW (ref >60)
Glucose, Bld: 137 mg/dL — ABNORMAL HIGH (ref 70–99)
Potassium: 4.1 mmol/L (ref 3.5–5.1)
Sodium: 136 mmol/L (ref 135–145)

## 2019-08-03 LAB — GLUCOSE, CAPILLARY
Glucose-Capillary: 128 mg/dL — ABNORMAL HIGH (ref 70–99)
Glucose-Capillary: 158 mg/dL — ABNORMAL HIGH (ref 70–99)
Glucose-Capillary: 184 mg/dL — ABNORMAL HIGH (ref 70–99)
Glucose-Capillary: 209 mg/dL — ABNORMAL HIGH (ref 70–99)

## 2019-08-03 LAB — CBC WITH DIFFERENTIAL/PLATELET
Abs Immature Granulocytes: 0.02 10*3/uL (ref 0.00–0.07)
Basophils Absolute: 0 10*3/uL (ref 0.0–0.1)
Basophils Relative: 1 %
Eosinophils Absolute: 0.2 10*3/uL (ref 0.0–0.5)
Eosinophils Relative: 4 %
HCT: 29.6 % — ABNORMAL LOW (ref 39.0–52.0)
Hemoglobin: 8.8 g/dL — ABNORMAL LOW (ref 13.0–17.0)
Immature Granulocytes: 1 %
Lymphocytes Relative: 19 %
Lymphs Abs: 0.8 10*3/uL (ref 0.7–4.0)
MCH: 25 pg — ABNORMAL LOW (ref 26.0–34.0)
MCHC: 29.7 g/dL — ABNORMAL LOW (ref 30.0–36.0)
MCV: 84.1 fL (ref 80.0–100.0)
Monocytes Absolute: 0.4 10*3/uL (ref 0.1–1.0)
Monocytes Relative: 10 %
Neutro Abs: 2.8 10*3/uL (ref 1.7–7.7)
Neutrophils Relative %: 65 %
Platelets: 171 10*3/uL (ref 150–400)
RBC: 3.52 MIL/uL — ABNORMAL LOW (ref 4.22–5.81)
RDW: 14.9 % (ref 11.5–15.5)
WBC: 4.3 10*3/uL (ref 4.0–10.5)
nRBC: 0 % (ref 0.0–0.2)

## 2019-08-03 NOTE — Progress Notes (Signed)
PROGRESS NOTE    [name]  SEG:[id number] DOB: 1945-11-05 DOA: 07/29/2019 PCP: [name], MD    Brief Narrative:  73 y.o.malewith medical history significant forHFpEF (EF 60-65%), CAD s/p DES and CABG, hepatic cirrhosis, CKD stage III, type 2 diabetes, hyperlipidemia, asthma, anemia of chronic disease, anduntreatedOSA who presents to the ED for evaluation of volume overload, dyspnea, fatigue, weight gain, and swelling in both lower extremities. Portable chest x-ray showed moderate pulmonary edema. Patient reports continued progressive swelling in both of his legs. He has been having increased pain and difficulty walking now. In the ED, vital signs fairly stable, saturating well on room air.  Covid test negative.  Patient admitted for further management.  Assessment & Plan:   Principal Problem:   Volume overload Active Problems:   Type 2 diabetes with complication (HCC)   Coronary artery disease    Hyperlipidemia with target LDL less than 70   Hepatic cirrhosis (HCC)   S/P CABG x 3   CKD (chronic kidney disease), stage III   (HFpEF) heart failure with preserved ejection fraction (HCC)  Acute on chronic diastolic HF -Clinically volume overloaded but now improving -Presenting BNP 223 -Will continue IV Lasix 60 mg twice daily for now -Monitor strict I/O's, daily weights -Bilateral Doppler personally reviewed, no evidence of DVT -PT/OT eval -Repeat 2d echo reviewed with EF of 55-60% -thus far net neg around 10L -Cr improving with lasix. LE edema seems to be improving  CAD s/p CABG and DES -Chest pain-free at this time -Not on antiplatelet therapy due to history of upper GI bleed -Remains stable at this time  Hepatic cirrhosis -Wihtout evidence of acute decompensation or encephalopathy -Continue spironolactone plus IV Lasix as tolerated -LFT's remain stable at this time  Type 2 diabetes mellitus -Last A1c 6.1 -Continue SSI, Accu-Cheks,  hypoglycemic protocol -Continuing to hold home Metformin while in hospital -Glucose trends stable  Anemia of chronic disease/History ofupperGI bleed -Hemoglobin at baseline -Remains hemodynamically stable  Hypertension -Continue home spironolactone -continue IV Lasix as tolerated per above  CKD stage III -Creatinine at baseline, stable -Cont to follow serial BMP  Asthma: -Continue home Anoro Ellipta and as needed albuterol -Remains stable currently  OSA -Continue nocturnal supplemental O2 via [place].  DVT prophylaxis: lovenox subQ Code Status: Full Family Communication: Pt in room, family not at bedside Disposition Plan: Uncertain at this time  Consultants:     Procedures:     Antimicrobials: Anti-infectives (From admission, onward)   Start     Dose/Rate Route Frequency Ordered Stop   07/29/19 1815  doxycycline (VIBRA-TABS) tablet 100 mg     100 mg Oral  Once 07/29/19 1806 07/29/19 1841      Subjective: Reports breathing better, swelling today better  Objective: Vitals:   08/03/19 0517 08/03/19 0744 08/03/19 0838 08/03/19 1302  BP: 136/80  (!) 144/61 (!) 144/59  Pulse: 70 70 69 68  Resp: 18 18  19   Temp: 98.1 F (36.7 C)   97.6 F (36.4 C)  TempSrc: Oral   Oral  SpO2: 99% 98%  99%  Weight: 108.3 kg     Height:        Intake/Output Summary (Last 24 hours) at 08/03/2019 1451 Last data filed at 08/03/2019 0902 Gross per 24 hour  Intake 600 ml  Output 2600 ml  Net -2000 ml   Filed Weights   07/31/19 0518 08/01/19 0500 08/03/19 0517  Weight: 113 kg 112.9 kg 108.3 kg    Examination: General  exam: Awake, laying in bed, in nad Respiratory system: Normal respiratory effort, no wheezing Cardiovascular system: regular rate, s1, s2 Gastrointestinal system: Soft, nondistended, positive BS Central nervous system: CN2-12 grossly intact, strength intact Extremities: Perfused, no clubbing, BLE edema improving Skin: Normal skin turgor, no notable skin  lesions seen Psychiatry: Mood normal // no visual hallucinations   Data Reviewed: I have personally reviewed following labs and imaging studies  CBC: Recent Labs  Lab 07/29/19 1218 07/30/19 0327 07/31/19 0509 08/01/19 0558 08/02/19 0517 08/03/19 0534  WBC 5.4 5.5 5.8 5.6 4.2 4.3  NEUTROABS 4.3  --  4.3 4.0 2.8 2.8  HGB 9.0* 9.4* 8.7* 9.0* 8.5* 8.8*  HCT 30.0* 31.2* 29.3* 30.5* 29.0* 29.6*  MCV 86.2 85.7 84.4 84.3 84.1 84.1  PLT 135* 142* 138* 149* 159 754   Basic Metabolic Panel: Recent Labs  Lab 07/30/19 0327 07/31/19 0509 08/01/19 0558 08/02/19 0517 08/03/19 0534  NA 139 135 135 133* 136  K 3.5 3.7 4.3 4.1 4.1  CL 100 97* 94* 97* 99  CO2 26 26 28 26 27   GLUCOSE 80 117* 148* 133* 137*  BUN 18 19 22  24* 28*  CREATININE 1.21 1.24 1.32* 1.33* 1.27*  CALCIUM 8.4* 8.3* 8.4* 8.4* 8.6*  MG 1.9  --   --   --   --    GFR: Estimated Creatinine Clearance: 60.8 mL/min (A) (by C-G formula based on SCr of 1.27 mg/dL (H)). Liver Function Tests: Recent Labs  Lab 07/29/19 1218 08/02/19 0517  AST 25 24  ALT 20 20  ALKPHOS 96 125  BILITOT 0.6 0.7  PROT 7.1 6.9  ALBUMIN 3.4* 3.0*   No results for input(s): LIPASE, AMYLASE in the last 168 hours. No results for input(s): AMMONIA in the last 168 hours. Coagulation Profile: No results for input(s): INR, PROTIME in the last 168 hours. Cardiac Enzymes: No results for input(s): CKTOTAL, CKMB, CKMBINDEX, TROPONINI in the last 168 hours. BNP (last 3 results) No results for input(s): PROBNP in the last 8760 hours. HbA1C: No results for input(s): HGBA1C in the last 72 hours. CBG: Recent Labs  Lab 08/02/19 1101 08/02/19 1555 08/02/19 2130 08/03/19 0547 08/03/19 1044  GLUCAP 151* 179* 139* 128* 158*   Lipid Profile: No results for input(s): CHOL, HDL, LDLCALC, TRIG, CHOLHDL, LDLDIRECT in the last 72 hours. Thyroid Function Tests: No results for input(s): TSH, T4TOTAL, FREET4, T3FREE, THYROIDAB in the last 72 hours. Anemia  Panel: No results for input(s): VITAMINB12, FOLATE, FERRITIN, TIBC, IRON, RETICCTPCT in the last 72 hours. Sepsis Labs: Recent Labs  Lab 07/29/19 1218  LATICACIDVEN 1.8    Recent Results (from the past 240 hour(s))  SARS CORONAVIRUS 2 (TAT 6-24 HRS) Nasopharyngeal Nasopharyngeal Swab     Status: None   Collection Time: 07/29/19  6:06 PM   Specimen: Nasopharyngeal Swab  Result Value Ref Range Status   SARS Coronavirus 2 NEGATIVE NEGATIVE Final    Comment: (NOTE) SARS-CoV-2 target nucleic acids are NOT DETECTED. The SARS-CoV-2 RNA is generally detectable in upper and lower respiratory specimens during the acute phase of infection. Negative results do not preclude SARS-CoV-2 infection, do not rule out co-infections with other pathogens, and should not be used as the sole basis for treatment or other patient management decisions. Negative results must be combined with clinical observations, patient history, and epidemiological information. The expected result is Negative. Fact Sheet for Patients: SugarRoll.be Fact Sheet for Healthcare Providers: https://www.woods-mathews.com/ This test is not yet approved or cleared by the [place]  States FDA and  has been authorized for detection and/or diagnosis of SARS-CoV-2 by FDA under an Emergency Use Authorization (EUA). This EUA will remain  in effect (meaning this test can be used) for the duration of the COVID-19 declaration under Section 56 4(b)(1) of the Act, 21 U.S.C. section 360bbb-3(b)(1), unless the authorization is terminated or revoked sooner. Performed at [facility], [place] [address], [place], [place] [address]      Radiology Studies: No results found.  Scheduled Meds: . enoxaparin (LOVENOX) injection  40 mg Subcutaneous Q24H  . ferrous sulfate  325 mg Oral Q supper  . furosemide  60 mg Intravenous Q12H  . gabapentin  100 mg Oral BID  . insulin aspart  0-9 Units Subcutaneous  TID WC  . potassium chloride  40 mEq Oral Daily  . sodium chloride flush  3 mL Intravenous Once  . sodium chloride flush  3 mL Intravenous Q12H  . spironolactone  50 mg Oral Daily  . umeclidinium-vilanterol  1 puff Inhalation Daily   Continuous Infusions:   LOS: 4 days   Marylu Lund, MD [facility] Pager On Amion  If 7PM-7AM, please contact night-coverage 08/03/2019, 2:51 PM

## 2019-08-04 LAB — CBC WITH DIFFERENTIAL/PLATELET
Abs Immature Granulocytes: 0.02 10*3/uL (ref 0.00–0.07)
Basophils Absolute: 0 10*3/uL (ref 0.0–0.1)
Basophils Relative: 1 %
Eosinophils Absolute: 0.3 10*3/uL (ref 0.0–0.5)
Eosinophils Relative: 6 %
HCT: 31.9 % — ABNORMAL LOW (ref 39.0–52.0)
Hemoglobin: 9.3 g/dL — ABNORMAL LOW (ref 13.0–17.0)
Immature Granulocytes: 1 %
Lymphocytes Relative: 22 %
Lymphs Abs: 1 10*3/uL (ref 0.7–4.0)
MCH: 24.6 pg — ABNORMAL LOW (ref 26.0–34.0)
MCHC: 29.2 g/dL — ABNORMAL LOW (ref 30.0–36.0)
MCV: 84.4 fL (ref 80.0–100.0)
Monocytes Absolute: 0.4 10*3/uL (ref 0.1–1.0)
Monocytes Relative: 10 %
Neutro Abs: 2.7 10*3/uL (ref 1.7–7.7)
Neutrophils Relative %: 60 %
Platelets: 212 10*3/uL (ref 150–400)
RBC: 3.78 MIL/uL — ABNORMAL LOW (ref 4.22–5.81)
RDW: 14.9 % (ref 11.5–15.5)
WBC: 4.4 10*3/uL (ref 4.0–10.5)
nRBC: 0 % (ref 0.0–0.2)

## 2019-08-04 LAB — BASIC METABOLIC PANEL
Anion gap: 9 (ref 5–15)
BUN: 24 mg/dL — ABNORMAL HIGH (ref 8–23)
CO2: 27 mmol/L (ref 22–32)
Calcium: 8.8 mg/dL — ABNORMAL LOW (ref 8.9–10.3)
Chloride: 98 mmol/L (ref 98–111)
Creatinine, Ser: 1.3 mg/dL — ABNORMAL HIGH (ref 0.61–1.24)
GFR calc Af Amer: >60 mL/min (ref >60)
GFR calc non Af Amer: 54 mL/min — ABNORMAL LOW (ref >60)
Glucose, Bld: 142 mg/dL — ABNORMAL HIGH (ref 70–99)
Potassium: 4.2 mmol/L (ref 3.5–5.1)
Sodium: 134 mmol/L — ABNORMAL LOW (ref 135–145)

## 2019-08-04 LAB — GLUCOSE, CAPILLARY
Glucose-Capillary: 133 mg/dL — ABNORMAL HIGH (ref 70–99)
Glucose-Capillary: 247 mg/dL — ABNORMAL HIGH (ref 70–99)
Glucose-Capillary: 300 mg/dL — ABNORMAL HIGH (ref 70–99)
Glucose-Capillary: 161 mg/dL — ABNORMAL HIGH (ref 70–99)
Glucose-Capillary: 170 mg/dL — ABNORMAL HIGH (ref 70–99)

## 2019-08-04 NOTE — Progress Notes (Signed)
Occupational Therapy Treatment Patient Details Name: [name] MRN: [id number] DOB: 06-07-1946 Today's Date: 08/04/2019    History of present illness 73 y.o. male with medical history significant for HFpEF (EF 60-65%), CAD s/p DES and CABG, hepatic cirrhosis, CKD stage III, type 2 diabetes, hyperlipidemia, asthma, anemia of chronic disease, and untreated OSA who is admitted with volume overload in setting of HFpEF   OT comments  Pt. Seen for skilled OT treatment session.  Pt. Able to ambulate in room from eob to b.room for toileting tasks.  Slow but steady during ambulation secondary to complaints of L knee and foot pain.  Min guard for most aspects of mobility and adl completion.  Note: home with HHPT/OT could be possibility vs. snf if pt. Cont. With mobility and adl progress.    Will alert OTR/L possible d/c plan recommendation updates may be needed.   Follow Up Recommendations  SNF    Equipment Recommendations  3 in 1 bedside commode;Other (comment)    Recommendations for Other Services      Precautions / Restrictions Precautions Precautions: Fall       Mobility Bed Mobility               General bed mobility comments: seated eob upon arrival  Transfers Overall transfer level: Needs assistance Equipment used: Rolling walker (2 wheeled) Transfers: Sit to/from [facility] Sit to Stand: Min assist Stand pivot transfers: Min assist       General transfer comment: min guard, extended time for stand to sit, good awarenes of safety during transfer    Balance                                           ADL either performed or assessed with clinical judgement   ADL Overall ADL's : Needs assistance/impaired                         Toilet Transfer: Ambulation;Minimal assistance;Grab bars;RW;Regular Cytogeneticist Details (indicate cue type and reason): stood with urinal then sat for BM (3n1 over the commode)          Functional mobility during ADLs: Minimal assistance;Rolling walker General ADL Comments: patient requires increase time for all mobility due to pain in legs and significant swelling     Vision       Perception     Praxis      Cognition Arousal/Alertness: Awake/alert Behavior During Therapy: WFL for tasks assessed/performed Overall Cognitive Status: Within Functional Limits for tasks assessed                                          Exercises     Shoulder Instructions       General Comments      Pertinent Vitals/ Pain       Pain Assessment: Faces Faces Pain Scale: Hurts whole lot Pain Location: L knee and foot Pain Descriptors / Indicators: Grimacing;Aching Pain Intervention(s): Limited activity within patient's tolerance;Monitored during session  Home Living                                          Prior  Functioning/Environment              Frequency  Min 3X/week        Progress Toward Goals  OT Goals(current goals can now be found in the care plan section)  Progress towards OT goals: Progressing toward goals     Plan Discharge plan remains appropriate;Discharge plan needs to be updated;Other (comment)    Co-evaluation                 AM-PAC OT "6 Clicks" Daily Activity     Outcome Measure   Help from another person eating meals?: None Help from another person taking care of personal grooming?: A Little Help from another person toileting, which includes using toliet, bedpan, or urinal?: A Lot Help from another person bathing (including washing, rinsing, drying)?: A Lot Help from another person to put on and taking off regular upper body clothing?: A Little Help from another person to put on and taking off regular lower body clothing?: Total 6 Click Score: 15    End of Session Equipment Utilized During Treatment: Rolling walker  OT Visit Diagnosis: Unsteadiness on feet (R26.81);Pain Pain -  part of body: Leg   Activity Tolerance Patient tolerated treatment well   Patient Left in chair;with call bell/phone within reach;with chair alarm set   Nurse Communication Mobility status        Time: 6195-0932 OT Time Calculation (min): 16 min  Charges: OT General Charges $OT Visit: 1 Visit OT Treatments $Self Care/Home Management : 8-22 mins  Janice Coffin,  COTA/L 08/04/2019, 9:20 AM

## 2019-08-04 NOTE — Progress Notes (Signed)
PROGRESS NOTE    [name]  YIR:[id number] DOB: 06/09/1946 DOA: 07/29/2019 PCP: [name], MD    Brief Narrative:  73 y.o.malewith medical history significant forHFpEF (EF 60-65%), CAD s/p DES and CABG, hepatic cirrhosis, CKD stage III, type 2 diabetes, hyperlipidemia, asthma, anemia of chronic disease, anduntreatedOSA who presents to the ED for evaluation of volume overload, dyspnea, fatigue, weight gain, and swelling in both lower extremities. Portable chest x-ray showed moderate pulmonary edema. Patient reports continued progressive swelling in both of his legs. He has been having increased pain and difficulty walking now. In the ED, vital signs fairly stable, saturating well on room air.  Covid test negative.  Patient admitted for further management.  Assessment & Plan:   Principal Problem:   Volume overload Active Problems:   Type 2 diabetes with complication (HCC)   Coronary artery disease    Hyperlipidemia with target LDL less than 70   Hepatic cirrhosis (HCC)   S/P CABG x 3   CKD (chronic kidney disease), stage III   (HFpEF) heart failure with preserved ejection fraction (HCC)  Acute on chronic diastolic HF -Clinically volume overloaded but now improving -Presenting BNP 223 -Will continue IV Lasix 60 mg twice daily for now -Monitor strict I/O's, daily weights -Bilateral Doppler personally reviewed, no evidence of DVT -PT/OT eval -Repeat 2d echo reviewed with EF of 55-60% -thus far net neg around 9.8L -LE continuing to improve. Cr thus far stable with lasix, will continue  CAD s/p CABG and DES -Chest pain-free at this time -Not on antiplatelet therapy due to history of upper GI bleed -Remains stable  Hepatic cirrhosis -Wihtout evidence of acute decompensation or encephalopathy -Continue spironolactone plus IV Lasix as tolerated -Cont to follow liver function closely  Type 2 diabetes mellitus -Last A1c 6.1 -Continue SSI, Accu-Cheks,  hypoglycemic protocol -Continuing to hold home Metformin while in hospital -Glucose up to 300 this AM. -Will change diet to heart healthy with carb mod, currently on heart healthy only  Anemia of chronic disease/History ofupperGI bleed -Hemoglobin at baseline -Remains hemodynamically at this time  Hypertension -Continue home spironolactone -continue IV Lasix as tolerated per above  CKD stage III -Creatinine at baseline, stable -Repeat bmet in AM  Asthma: -Continue home Anoro Ellipta and as needed albuterol -Stable currently at this time  OSA -Continue nocturnal supplemental O2 via [place].  DVT prophylaxis: lovenox subQ Code Status: Full Family Communication: Pt in room, family not at bedside Disposition Plan: Uncertain at this time  Consultants:     Procedures:     Antimicrobials: Anti-infectives (From admission, onward)   Start     Dose/Rate Route Frequency Ordered Stop   07/29/19 1815  doxycycline (VIBRA-TABS) tablet 100 mg     100 mg Oral  Once 07/29/19 1806 07/29/19 1841      Subjective: Reports feeling somewhat better  Objective: Vitals:   08/04/19 0606 08/04/19 0828 08/04/19 0842 08/04/19 1144  BP: (!) 148/61 (!) 143/44  132/63  Pulse: 65 75 72 68  Resp: 20  18 18   Temp: 98.2 F (36.8 C)   98.2 F (36.8 C)  TempSrc: Oral   Oral  SpO2: 96%  96% 98%  Weight:      Height:        Intake/Output Summary (Last 24 hours) at 08/04/2019 1312 Last data filed at 08/04/2019 0600 Gross per 24 hour  Intake 600 ml  Output 500 ml  Net 100 ml   Filed Weights   07/31/19 0518 08/01/19  0500 08/03/19 0517  Weight: 113 kg 112.9 kg 108.3 kg    Examination: General exam: Conversant, in no acute distress Respiratory system: normal chest rise, clear, no audible wheezing Cardiovascular system: regular rhythm, s1-s2 Gastrointestinal system: Nondistended, nontender, pos BS Central nervous system: No seizures, no tremors Extremities: No cyanosis, no joint  deformities Skin: No rashes, no pallor Psychiatry: Affect normal // no auditory hallucinations   Data Reviewed: I have personally reviewed following labs and imaging studies  CBC: Recent Labs  Lab 07/31/19 0509 08/01/19 0558 08/02/19 0517 08/03/19 0534 08/04/19 0617  WBC 5.8 5.6 4.2 4.3 4.4  NEUTROABS 4.3 4.0 2.8 2.8 2.7  HGB 8.7* 9.0* 8.5* 8.8* 9.3*  HCT 29.3* 30.5* 29.0* 29.6* 31.9*  MCV 84.4 84.3 84.1 84.1 84.4  PLT 138* 149* 159 171 662   Basic Metabolic Panel: Recent Labs  Lab 07/30/19 0327 07/31/19 0509 08/01/19 0558 08/02/19 0517 08/03/19 0534 08/04/19 0617  NA 139 135 135 133* 136 134*  K 3.5 3.7 4.3 4.1 4.1 4.2  CL 100 97* 94* 97* 99 98  CO2 26 26 28 26 27 27   GLUCOSE 80 117* 148* 133* 137* 142*  BUN 18 19 22  24* 28* 24*  CREATININE 1.21 1.24 1.32* 1.33* 1.27* 1.30*  CALCIUM 8.4* 8.3* 8.4* 8.4* 8.6* 8.8*  MG 1.9  --   --   --   --   --    GFR: Estimated Creatinine Clearance: 59.4 mL/min (A) (by C-G formula based on SCr of 1.3 mg/dL (H)). Liver Function Tests: Recent Labs  Lab 07/29/19 1218 08/02/19 0517  AST 25 24  ALT 20 20  ALKPHOS 96 125  BILITOT 0.6 0.7  PROT 7.1 6.9  ALBUMIN 3.4* 3.0*   No results for input(s): LIPASE, AMYLASE in the last 168 hours. No results for input(s): AMMONIA in the last 168 hours. Coagulation Profile: No results for input(s): INR, PROTIME in the last 168 hours. Cardiac Enzymes: No results for input(s): CKTOTAL, CKMB, CKMBINDEX, TROPONINI in the last 168 hours. BNP (last 3 results) No results for input(s): PROBNP in the last 8760 hours. HbA1C: No results for input(s): HGBA1C in the last 72 hours. CBG: Recent Labs  Lab 08/03/19 1612 08/03/19 2037 08/04/19 0602 08/04/19 1108 08/04/19 1111  GLUCAP 184* 209* 133* 300* 247*   Lipid Profile: No results for input(s): CHOL, HDL, LDLCALC, TRIG, CHOLHDL, LDLDIRECT in the last 72 hours. Thyroid Function Tests: No results for input(s): TSH, T4TOTAL, FREET4, T3FREE,  THYROIDAB in the last 72 hours. Anemia Panel: No results for input(s): VITAMINB12, FOLATE, FERRITIN, TIBC, IRON, RETICCTPCT in the last 72 hours. Sepsis Labs: Recent Labs  Lab 07/29/19 1218  LATICACIDVEN 1.8    Recent Results (from the past 240 hour(s))  SARS CORONAVIRUS 2 (TAT 6-24 HRS) Nasopharyngeal Nasopharyngeal Swab     Status: None   Collection Time: 07/29/19  6:06 PM   Specimen: Nasopharyngeal Swab  Result Value Ref Range Status   SARS Coronavirus 2 NEGATIVE NEGATIVE Final    Comment: (NOTE) SARS-CoV-2 target nucleic acids are NOT DETECTED. The SARS-CoV-2 RNA is generally detectable in upper and lower respiratory specimens during the acute phase of infection. Negative results do not preclude SARS-CoV-2 infection, do not rule out co-infections with other pathogens, and should not be used as the sole basis for treatment or other patient management decisions. Negative results must be combined with clinical observations, patient history, and epidemiological information. The expected result is Negative. Fact Sheet for Patients: SugarRoll.be Fact Sheet for Healthcare  Providers: https://www.woods-mathews.com/ This test is not yet approved or cleared by the [place] and  has been authorized for detection and/or diagnosis of SARS-CoV-2 by FDA under an Emergency Use Authorization (EUA). This EUA will remain  in effect (meaning this test can be used) for the duration of the COVID-19 declaration under Section 56 4(b)(1) of the Act, 21 U.S.C. section 360bbb-3(b)(1), unless the authorization is terminated or revoked sooner. Performed at [facility], [place] [address], [place], [place] [address]      Radiology Studies: No results found.  Scheduled Meds: . enoxaparin (LOVENOX) injection  40 mg Subcutaneous Q24H  . ferrous sulfate  325 mg Oral Q supper  . furosemide  60 mg Intravenous Q12H  . gabapentin  100 mg Oral BID  .  insulin aspart  0-9 Units Subcutaneous TID WC  . sodium chloride flush  3 mL Intravenous Once  . sodium chloride flush  3 mL Intravenous Q12H  . spironolactone  50 mg Oral Daily  . umeclidinium-vilanterol  1 puff Inhalation Daily   Continuous Infusions:   LOS: 5 days   Marylu Lund, MD [facility] Pager On Amion  If 7PM-7AM, please contact night-coverage 08/04/2019, 1:12 PM

## 2019-08-05 LAB — CBC WITH DIFFERENTIAL/PLATELET
Abs Immature Granulocytes: 0.02 10*3/uL (ref 0.00–0.07)
Basophils Absolute: 0 10*3/uL (ref 0.0–0.1)
Basophils Relative: 1 %
Eosinophils Absolute: 0.3 10*3/uL (ref 0.0–0.5)
Eosinophils Relative: 5 %
HCT: 32.5 % — ABNORMAL LOW (ref 39.0–52.0)
Hemoglobin: 9.7 g/dL — ABNORMAL LOW (ref 13.0–17.0)
Immature Granulocytes: 0 %
Lymphocytes Relative: 21 %
Lymphs Abs: 1 10*3/uL (ref 0.7–4.0)
MCH: 24.8 pg — ABNORMAL LOW (ref 26.0–34.0)
MCHC: 29.8 g/dL — ABNORMAL LOW (ref 30.0–36.0)
MCV: 83.1 fL (ref 80.0–100.0)
Monocytes Absolute: 0.4 10*3/uL (ref 0.1–1.0)
Monocytes Relative: 9 %
Neutro Abs: 3 10*3/uL (ref 1.7–7.7)
Neutrophils Relative %: 64 %
Platelets: 221 10*3/uL (ref 150–400)
RBC: 3.91 MIL/uL — ABNORMAL LOW (ref 4.22–5.81)
RDW: 14.8 % (ref 11.5–15.5)
WBC: 4.7 10*3/uL (ref 4.0–10.5)
nRBC: 0 % (ref 0.0–0.2)

## 2019-08-05 LAB — COMPREHENSIVE METABOLIC PANEL
ALT: 23 U/L (ref 0–44)
AST: 30 U/L (ref 15–41)
Albumin: 3.1 g/dL — ABNORMAL LOW (ref 3.5–5.0)
Alkaline Phosphatase: 164 U/L — ABNORMAL HIGH (ref 38–126)
Anion gap: 11 (ref 5–15)
BUN: 24 mg/dL — ABNORMAL HIGH (ref 8–23)
CO2: 26 mmol/L (ref 22–32)
Calcium: 9.1 mg/dL (ref 8.9–10.3)
Chloride: 97 mmol/L — ABNORMAL LOW (ref 98–111)
Creatinine, Ser: 1.33 mg/dL — ABNORMAL HIGH (ref 0.61–1.24)
GFR calc Af Amer: >60 mL/min (ref >60)
GFR calc non Af Amer: 53 mL/min — ABNORMAL LOW (ref >60)
Glucose, Bld: 179 mg/dL — ABNORMAL HIGH (ref 70–99)
Potassium: 4.1 mmol/L (ref 3.5–5.1)
Sodium: 134 mmol/L — ABNORMAL LOW (ref 135–145)
Total Bilirubin: 0.2 mg/dL — ABNORMAL LOW (ref 0.3–1.2)
Total Protein: 7.1 g/dL (ref 6.5–8.1)

## 2019-08-05 LAB — GLUCOSE, CAPILLARY
Glucose-Capillary: 127 mg/dL — ABNORMAL HIGH (ref 70–99)
Glucose-Capillary: 165 mg/dL — ABNORMAL HIGH (ref 70–99)
Glucose-Capillary: 163 mg/dL — ABNORMAL HIGH (ref 70–99)
Glucose-Capillary: 164 mg/dL — ABNORMAL HIGH (ref 70–99)

## 2019-08-05 NOTE — Plan of Care (Signed)
  Problem: Clinical Measurements: Goal: Ability to maintain clinical measurements within normal limits will improve Outcome: Progressing Goal: Will remain free from infection Outcome: Progressing Goal: Respiratory complications will improve Outcome: Progressing   Problem: Activity: Goal: Risk for activity intolerance will decrease Outcome: Progressing   Problem: Nutrition: Goal: Adequate nutrition will be maintained Outcome: Progressing   Problem: Safety: Goal: Ability to remain free from injury will improve Outcome: Progressing

## 2019-08-05 NOTE — Progress Notes (Signed)
PROGRESS NOTE    REA RESER  NLG:[id number] DOB: 14-Dec-1945 DOA: 07/29/2019 PCP: [name], MD    Brief Narrative:  73 y.o.malewith medical history significant forHFpEF (EF 60-65%), CAD s/p DES and CABG, hepatic cirrhosis, CKD stage III, type 2 diabetes, hyperlipidemia, asthma, anemia of chronic disease, anduntreatedOSA who presents to the ED for evaluation of volume overload, dyspnea, fatigue, weight gain, and swelling in both lower extremities. Portable chest x-ray showed moderate pulmonary edema. Patient reports continued progressive swelling in both of his legs. He has been having increased pain and difficulty walking now. In the ED, vital signs fairly stable, saturating well on room air.  Covid test negative.  Patient admitted for further management.  Assessment & Plan:   Principal Problem:   Volume overload Active Problems:   Type 2 diabetes with complication (HCC)   Coronary artery disease    Hyperlipidemia with target LDL less than 70   Hepatic cirrhosis (HCC)   S/P CABG x 3   CKD (chronic kidney disease), stage III   (HFpEF) heart failure with preserved ejection fraction (HCC)  Acute on chronic diastolic HF -Clinically volume overloaded but now improving -Presenting BNP 223 -Will continue IV Lasix 60 mg twice daily for now -Monitor strict I/O's, daily weights -Bilateral Doppler personally reviewed, no evidence of DVT -PT/OT eval -Repeat 2d echo reviewed with EF of 55-60% -thus far net neg around 10L, however urine output recently not being strictly monitored -LE continuing to improve. Cr thus far stable with lasix, will continue  CAD s/p CABG and DES -Chest pain-free at this time -Not on antiplatelet therapy due to history of upper GI bleed -Remains stable  Hepatic cirrhosis -Wihtout evidence of acute decompensation or encephalopathy -Continue spironolactone plus IV Lasix as tolerated -LFT's reviewed, LFT trends stable  Type 2 diabetes  mellitus -Last A1c 6.1 -Continue SSI, Accu-Cheks, hypoglycemic protocol -Continuing to hold home Metformin while in hospital -Glucose peaked to 300's recently -Improved with Heart healthy with carb mod diet  Anemia of chronic disease/History ofupperGI bleed -Hemoglobin at baseline -Remains hemodynamically at this time  Hypertension -Continue home spironolactone -continue IV Lasix as tolerated per above  CKD stage III -Creatinine at baseline, stable -Recheck bmet in  AM  Asthma: -Continue home Anoro Ellipta and as needed albuterol -Stable currently at this time  OSA -Continue nocturnal supplemental O2 via [place].  DVT prophylaxis: lovenox subQ Code Status: Full Family Communication: Pt in room, family not at bedside Disposition Plan: Uncertain at this time  Consultants:     Procedures:     Antimicrobials: Anti-infectives (From admission, onward)   Start     Dose/Rate Route Frequency Ordered Stop   07/29/19 1815  doxycycline (VIBRA-TABS) tablet 100 mg     100 mg Oral  Once 07/29/19 1806 07/29/19 1841      Subjective: Reports swelling continues to improve  Objective: Vitals:   08/05/19 0042 08/05/19 0552 08/05/19 0758 08/05/19 1211  BP: (!) 144/66 134/63  (!) 140/59  Pulse: 72 68 68 72  Resp: 20 20 18 16   Temp: 98.1 F (36.7 C) 97.7 F (36.5 C)  97.8 F (36.6 C)  TempSrc: Oral Oral  Oral  SpO2: 99% 96% 96% 94%  Weight:  106.6 kg    Height:        Intake/Output Summary (Last 24 hours) at 08/05/2019 1603 Last data filed at 08/05/2019 1230 Gross per 24 hour  Intake 1203 ml  Output -  Net 1203 ml   [employer]  08/01/19 0500 08/03/19 0517 08/05/19 0552  Weight: 112.9 kg 108.3 kg 106.6 kg    Examination: General exam: Awake, laying in bed, in nad Respiratory system: Normal respiratory effort, no wheezing Cardiovascular system: regular rate, s1, s2 Gastrointestinal system: Soft, nondistended, positive BS Central nervous system: CN2-12  grossly intact, strength intact Extremities: Perfused, no clubbing, BLE edema improving Skin: Normal skin turgor, no notable skin lesions seen Psychiatry: Mood normal // no visual hallucinations   Data Reviewed: I have personally reviewed following labs and imaging studies  CBC: Recent Labs  Lab 08/01/19 0558 08/02/19 0517 08/03/19 0534 08/04/19 0617 08/05/19 0712  WBC 5.6 4.2 4.3 4.4 4.7  NEUTROABS 4.0 2.8 2.8 2.7 3.0  HGB 9.0* 8.5* 8.8* 9.3* 9.7*  HCT 30.5* 29.0* 29.6* 31.9* 32.5*  MCV 84.3 84.1 84.1 84.4 83.1  PLT 149* 159 171 212 132   Basic Metabolic Panel: Recent Labs  Lab 07/30/19 0327  08/01/19 0558 08/02/19 0517 08/03/19 0534 08/04/19 0617 08/05/19 0712  NA 139   < > 135 133* 136 134* 134*  K 3.5   < > 4.3 4.1 4.1 4.2 4.1  CL 100   < > 94* 97* 99 98 97*  CO2 26   < > 28 26 27 27 26   GLUCOSE 80   < > 148* 133* 137* 142* 179*  BUN 18   < > 22 24* 28* 24* 24*  CREATININE 1.21   < > 1.32* 1.33* 1.27* 1.30* 1.33*  CALCIUM 8.4*   < > 8.4* 8.4* 8.6* 8.8* 9.1  MG 1.9  --   --   --   --   --   --    < > = values in this interval not displayed.   GFR: Estimated Creatinine Clearance: 57.6 mL/min (A) (by C-G formula based on SCr of 1.33 mg/dL (H)). Liver Function Tests: Recent Labs  Lab 08/02/19 0517 08/05/19 0712  AST 24 30  ALT 20 23  ALKPHOS 125 164*  BILITOT 0.7 0.2*  PROT 6.9 7.1  ALBUMIN 3.0* 3.1*   No results for input(s): LIPASE, AMYLASE in the last 168 hours. No results for input(s): AMMONIA in the last 168 hours. Coagulation Profile: No results for input(s): INR, PROTIME in the last 168 hours. Cardiac Enzymes: No results for input(s): CKTOTAL, CKMB, CKMBINDEX, TROPONINI in the last 168 hours. BNP (last 3 results) No results for input(s): PROBNP in the last 8760 hours. HbA1C: No results for input(s): HGBA1C in the last 72 hours. CBG: Recent Labs  Lab 08/04/19 1111 08/04/19 1628 08/04/19 2133 08/05/19 0549 08/05/19 1101  GLUCAP 247* 161*  170* 127* 165*   Lipid Profile: No results for input(s): CHOL, HDL, LDLCALC, TRIG, CHOLHDL, LDLDIRECT in the last 72 hours. Thyroid Function Tests: No results for input(s): TSH, T4TOTAL, FREET4, T3FREE, THYROIDAB in the last 72 hours. Anemia Panel: No results for input(s): VITAMINB12, FOLATE, FERRITIN, TIBC, IRON, RETICCTPCT in the last 72 hours. Sepsis Labs: No results for input(s): PROCALCITON, LATICACIDVEN in the last 168 hours.  Recent Results (from the past 240 hour(s))  SARS CORONAVIRUS 2 (TAT 6-24 HRS) Nasopharyngeal Nasopharyngeal Swab     Status: None   Collection Time: 07/29/19  6:06 PM   Specimen: Nasopharyngeal Swab  Result Value Ref Range Status   SARS Coronavirus 2 NEGATIVE NEGATIVE Final    Comment: (NOTE) SARS-CoV-2 target nucleic acids are NOT DETECTED. The SARS-CoV-2 RNA is generally detectable in upper and lower respiratory specimens during the acute phase of infection. Negative results  do not preclude SARS-CoV-2 infection, do not rule out co-infections with other pathogens, and should not be used as the sole basis for treatment or other patient management decisions. Negative results must be combined with clinical observations, patient history, and epidemiological information. The expected result is Negative. Fact Sheet for Patients: SugarRoll.be Fact Sheet for Healthcare Providers: https://www.woods-mathews.com/ This test is not yet approved or cleared by the [place] FDA and  has been authorized for detection and/or diagnosis of SARS-CoV-2 by FDA under an Emergency Use Authorization (EUA). This EUA will remain  in effect (meaning this test can be used) for the duration of the COVID-19 declaration under Section 56 4(b)(1) of the Act, 21 U.S.C. section 360bbb-3(b)(1), unless the authorization is terminated or revoked sooner. Performed at [facility], [place] [address] [place], [place] [address]       Radiology Studies: No results found.  Scheduled Meds: . enoxaparin (LOVENOX) injection  40 mg Subcutaneous Q24H  . ferrous sulfate  325 mg Oral Q supper  . furosemide  60 mg Intravenous Q12H  . gabapentin  100 mg Oral BID  . insulin aspart  0-9 Units Subcutaneous TID WC  . sodium chloride flush  3 mL Intravenous Once  . sodium chloride flush  3 mL Intravenous Q12H  . spironolactone  50 mg Oral Daily  . umeclidinium-vilanterol  1 puff Inhalation Daily   Continuous Infusions:   LOS: 6 days   Marylu Lund, MD [facility] Pager On Amion  If 7PM-7AM, please contact night-coverage 08/05/2019, 4:03 PM

## 2019-08-05 NOTE — Care Management Important Message (Signed)
Important Message  Patient Details  Name: [name] MRN: [id number] Date of Birth: 1946-02-24   Medicare Important Message Given:  Yes     [name] 08/05/2019, 4:19 PM

## 2019-08-05 NOTE — Progress Notes (Signed)
Physical Therapy Treatment Patient Details Name: [name] MRN: [id number] DOB: [birth year] Today's Date: 08/05/2019    History of Present Illness 73 y.o. male with medical history significant for HFpEF (EF 60-65%), CAD s/p DES and CABG, hepatic cirrhosis, CKD stage III, type 2 diabetes, hyperlipidemia, asthma, anemia of chronic disease, and untreated OSA who is admitted with volume overload in setting of HFpEF    PT Comments    Patient progressing with mobility/independence, but remains limited due to L LE pain.  Feels his home set up is limiting due to narrow hallways and limited space in doorways to use walker.  As well wife recently home from hospital.  Son can assist, but pt feels best to maintain current d/c plans for rehab prior to d/c home.  PT to follow acutely.    Follow Up Recommendations  SNF     Equipment Recommendations  Rolling walker with 5" wheels    Recommendations for Other Services       Precautions / Restrictions Precautions Precautions: Fall    Mobility  Bed Mobility   Bed Mobility: Sit to Supine       Sit to supine: Supervision   General bed mobility comments: up on EOB upon arrival; to supine with S and increased time/effort due to pain  Transfers Overall transfer level: Needs assistance Equipment used: Rolling walker (2 wheeled) Transfers: Sit to/from Stand Sit to Stand: Supervision         General transfer comment: assist for safety, adjusted RW for height, cues for hand placement  Ambulation/Gait Ambulation/Gait assistance: Min guard Gait Distance (Feet): 40 Feet Assistive device: Rolling walker (2 wheeled) Gait Pattern/deviations: Step-to pattern;Decreased dorsiflexion - left;Decreased stance time - left;Shuffle;Antalgic     General Gait Details: slow with pain and limited tolerance.   Stairs Stairs: (discussed side stepping up steps with one rail and leading with R foot)           Wheelchair Mobility    Modified  [place] (Stroke Patients Only)       Balance Overall balance assessment: Needs assistance   Sitting balance-Leahy Scale: Good       Standing balance-Leahy Scale: Fair Standing balance comment: static standing no device, with ambulation needs RW due to L LE pain                            Cognition Arousal/Alertness: Awake/alert Behavior During Therapy: WFL for tasks assessed/performed Overall Cognitive Status: Within Functional Limits for tasks assessed                                        Exercises      General Comments General comments (skin integrity, edema, etc.): Discussed d/c and pt reports lots of home barriers with narrow hallways, small doorway to bathroom and wife recently home from [place] stay.  Son is there to assist, but pt feels best to take longer to rehab prior to d/c home.      Pertinent Vitals/Pain Faces Pain Scale: Hurts whole lot Pain Location: L knee and foot with ambulation Pain Descriptors / Indicators: Grimacing;Aching;Burning Pain Intervention(s): Monitored during session;Repositioned;Limited activity within patient's tolerance    Home Living                      Prior Function  PT Goals (current goals can now be found in the care plan section) Progress towards PT goals: Progressing toward goals    Frequency    Min 3X/week      PT Plan Current plan remains appropriate    Co-evaluation              AM-PAC PT "6 Clicks" Mobility   Outcome Measure  Help needed turning from your back to your side while in a flat bed without using bedrails?: A Little Help needed moving from lying on your back to sitting on the side of a flat bed without using bedrails?: None Help needed moving to and from a bed to a chair (including a wheelchair)?: A Little Help needed standing up from a chair using your arms (e.g., wheelchair or bedside chair)?: None Help needed to walk in hospital room?: A  Little Help needed climbing 3-5 steps with a railing? : A Little 6 Click Score: 20    End of Session   Activity Tolerance: Patient limited by pain Patient left: in bed;with call bell/phone within reach   PT Visit Diagnosis: Pain;Difficulty in walking, not elsewhere classified (R26.2);Muscle weakness (generalized) (M62.81) Pain - Right/Left: Left Pain - part of body: Leg     Time: 0912-0936 PT Time Calculation (min) (ACUTE ONLY): 24 min  Charges:  $Gait Training: 8-22 mins $Self Care/Home Management: [facility], [place] [phone] 08/05/2019    [name] 08/05/2019, 10:24 AM

## 2019-08-05 NOTE — Progress Notes (Signed)
Occupational Therapy Treatment Patient Details Name: [name] MRN: [id number] DOB: 03/22/1946 Today's Date: 08/05/2019    History of present illness 73 y.o. male with medical history significant for HFpEF (EF 60-65%), CAD s/p DES and CABG, hepatic cirrhosis, CKD stage III, type 2 diabetes, hyperlipidemia, asthma, anemia of chronic disease, and untreated OSA who is admitted with volume overload in setting of HFpEF   OT comments  Pt demonstrated ability to toilet, groom and dress UB with supervision. Can don socks, but requires increased effort and is fatiguing. Pt reports having a sock aide at home, recommended use. Updated d/c to home with [place].  Follow Up Recommendations  Home health OT    Equipment Recommendations       Recommendations for Other Services      Precautions / Restrictions Precautions Precautions: Fall       Mobility Bed Mobility Overal bed mobility: Modified Independent Bed Mobility: Sit to Supine       Sit to supine: Modified independent (Device/Increase time)   General bed mobility comments: returned to supine without assist  Transfers Overall transfer level: Needs assistance Equipment used: Rolling walker (2 wheeled) Transfers: Sit to/from Stand Sit to Stand: Supervision         General transfer comment: for safety    Balance Overall balance assessment: Needs assistance   Sitting balance-Leahy Scale: Good       Standing balance-Leahy Scale: Fair Standing balance comment: static standing no device, with ambulation needs RW due to L LE pain                           ADL either performed or assessed with clinical judgement   ADL Overall ADL's : Needs assistance/impaired     Grooming: Supervision/safety;Standing;Wash/dry hands           Upper Body Dressing : Set up;Sitting   Lower Body Dressing: Minimal assistance;Sitting/lateral leans Lower Body Dressing Details (indicate cue type and reason): nearly able to cross  foot over opposite knee, pt has a sock aid at home as needed Toilet Transfer: Supervision/safety;Ambulation;RW;BSC   Toileting- Water quality scientist and Hygiene: Supervision/safety;Sit to/from stand       Functional mobility during ADLs: Supervision/safety;Rolling walker       Vision       Perception     Praxis      Cognition Arousal/Alertness: Awake/alert Behavior During Therapy: WFL for tasks assessed/performed Overall Cognitive Status: Within Functional Limits for tasks assessed                                          Exercises     Shoulder Instructions       General Comments Discussed d/c and pt reports lots of home barriers with narrow hallways, small doorway to bathroom and wife recently home from [place] stay.  Son is there to assist, but pt feels best to take longer to rehab prior to d/c home.    Pertinent Vitals/ Pain       Pain Assessment: No/denies pain Faces Pain Scale: Hurts whole lot Pain Location: L knee and foot with ambulation Pain Descriptors / Indicators: Grimacing;Aching;Burning Pain Intervention(s): Monitored during session;Repositioned;Limited activity within patient's tolerance  Home Living  Prior Functioning/Environment              Frequency  Min 2X/week        Progress Toward Goals  OT Goals(current goals can now be found in the care plan section)  Progress towards OT goals: Progressing toward goals  Acute Rehab OT Goals Patient Stated Goal: go home tomorrow OT Goal Formulation: With patient Time For Goal Achievement: 08/14/19 Potential to Achieve Goals: Good  Plan Discharge plan needs to be updated    Co-evaluation                 AM-PAC OT "6 Clicks" Daily Activity     Outcome Measure   Help from another person eating meals?: None Help from another person taking care of personal grooming?: A Little Help from another person  toileting, which includes using toliet, bedpan, or urinal?: A Little Help from another person bathing (including washing, rinsing, drying)?: A Little Help from another person to put on and taking off regular upper body clothing?: None Help from another person to put on and taking off regular lower body clothing?: A Little 6 Click Score: 20    End of Session Equipment Utilized During Treatment: Rolling walker  OT Visit Diagnosis: Unsteadiness on feet (R26.81)   Activity Tolerance Patient tolerated treatment well   Patient Left in bed;with call bell/phone within reach   Nurse Communication          Time: 0955-1010 OT Time Calculation (min): 15 min  Charges: OT General Charges $OT Visit: 1 Visit OT Treatments $Self Care/Home Management : 8-22 mins  Nestor Lewandowsky, OTR/L Acute Rehabilitation Services Pager: [phone] Office: [phone]   Malka So 08/05/2019, 11:36 AM

## 2019-08-06 LAB — CBC WITH DIFFERENTIAL/PLATELET
Abs Immature Granulocytes: 0.02 10*3/uL (ref 0.00–0.07)
Basophils Absolute: 0 10*3/uL (ref 0.0–0.1)
Basophils Relative: 1 %
Eosinophils Absolute: 0.3 10*3/uL (ref 0.0–0.5)
Eosinophils Relative: 6 %
HCT: 32.1 % — ABNORMAL LOW (ref 39.0–52.0)
Hemoglobin: 9.3 g/dL — ABNORMAL LOW (ref 13.0–17.0)
Immature Granulocytes: 0 %
Lymphocytes Relative: 24 %
Lymphs Abs: 1.2 10*3/uL (ref 0.7–4.0)
MCH: 24.1 pg — ABNORMAL LOW (ref 26.0–34.0)
MCHC: 29 g/dL — ABNORMAL LOW (ref 30.0–36.0)
MCV: 83.2 fL (ref 80.0–100.0)
Monocytes Absolute: 0.4 10*3/uL (ref 0.1–1.0)
Monocytes Relative: 9 %
Neutro Abs: 2.9 10*3/uL (ref 1.7–7.7)
Neutrophils Relative %: 60 %
Platelets: 220 10*3/uL (ref 150–400)
RBC: 3.86 MIL/uL — ABNORMAL LOW (ref 4.22–5.81)
RDW: 14.7 % (ref 11.5–15.5)
WBC: 4.8 10*3/uL (ref 4.0–10.5)
nRBC: 0 % (ref 0.0–0.2)

## 2019-08-06 LAB — COMPREHENSIVE METABOLIC PANEL
ALT: 22 U/L (ref 0–44)
AST: 29 U/L (ref 15–41)
Albumin: 3 g/dL — ABNORMAL LOW (ref 3.5–5.0)
Alkaline Phosphatase: 177 U/L — ABNORMAL HIGH (ref 38–126)
Anion gap: 10 (ref 5–15)
BUN: 26 mg/dL — ABNORMAL HIGH (ref 8–23)
CO2: 27 mmol/L (ref 22–32)
Calcium: 8.9 mg/dL (ref 8.9–10.3)
Chloride: 99 mmol/L (ref 98–111)
Creatinine, Ser: 1.29 mg/dL — ABNORMAL HIGH (ref 0.61–1.24)
GFR calc Af Amer: >60 mL/min (ref >60)
GFR calc non Af Amer: 55 mL/min — ABNORMAL LOW (ref >60)
Glucose, Bld: 139 mg/dL — ABNORMAL HIGH (ref 70–99)
Potassium: 4.2 mmol/L (ref 3.5–5.1)
Sodium: 136 mmol/L (ref 135–145)
Total Bilirubin: 0.5 mg/dL (ref 0.3–1.2)
Total Protein: 7.3 g/dL (ref 6.5–8.1)

## 2019-08-06 LAB — GLUCOSE, CAPILLARY
Glucose-Capillary: 131 mg/dL — ABNORMAL HIGH (ref 70–99)
Glucose-Capillary: 156 mg/dL — ABNORMAL HIGH (ref 70–99)
Glucose-Capillary: 139 mg/dL — ABNORMAL HIGH (ref 70–99)
Glucose-Capillary: 219 mg/dL — ABNORMAL HIGH (ref 70–99)

## 2019-08-06 NOTE — TOC Progression Note (Signed)
Transition of Care [facility]) - Progression Note    Patient Details  Name: [name] MRN: [id number] Date of Birth: 25-Mar-1946  Transition of Care [facility]) CM/SW [place], [place] Phone Number: 08/06/2019, 11:26 AM  Clinical Narrative:    CSW met with pt again at bedside. Re-introduced self, role, reason for visit.  Pt still would like to go home, has rolling walker. He let CSW know that he is unable to reach/write so he would like options to be sent to his son. CSW also received permission to schedule PCP appointment.    Expected Discharge Plan: [place] Barriers to Discharge: Continued Medical Work up  Expected Discharge Plan and Services Expected Discharge Plan: [place] In-house Referral: Clinical Social Work Discharge Planning Services: CM Consult Post Acute Care Choice: [place] arrangements for the past 2 months: Single Family Home  Readmission Risk Interventions Readmission Risk Prevention Plan 08/01/2019  Transportation Screening Complete  PCP or Specialist Appt within 5-7 Days Not Complete  Not Complete comments disposition pending  Home Care Screening Complete  Medication Review (RN CM) Complete  Some recent data might be hidden

## 2019-08-06 NOTE — TOC Initial Note (Addendum)
Transition of Care [facility]) - Initial/Assessment Note    Patient Details  Name: [name] MRN: [id number] Date of Birth: January 29, 1946  Transition of Care [facility]) CM/SW Contact:    John Favre, RN Phone Number: 08/06/2019, 2:12 PM  Clinical Narrative:                 Patient gave permission for NCM to call son [name]. Spoke to [place] via phone. [name] in agreement for patient to go home at discharge with home health instead of SNF. Offered Medicare.gov list of home health agencies. [name] has no preference, will work on finding agency who accepts [employer] and has staff.  [name] reports patient has 24 hour support at home.   Patient has walker at home already.  [name] with [name] has accepted referral. Asked MD for orders. Expected Discharge Plan: [name] Barriers to Discharge: Continued Medical Work up   Patient Goals and CMS Choice Patient states their goals for this hospitalization and ongoing recovery are:: to return to home CMS Medicare.gov Compare Post Acute Care list provided to:: Patient Choice offered to / list presented to : Patient, Adult Children  Expected Discharge Plan and Services Expected Discharge Plan: [place] In-house Referral: Clinical Social Work Discharge Planning Services: CM Consult Post Acute Care Choice: [place] arrangements for the past 2 months: Single Family Home                 DME Arranged: N/A         HH Arranged: PT, OT          Prior Living Arrangements/Services Living arrangements for the past 2 months: Single Family Home Lives with:: Spouse, Adult Children Patient language and need for interpreter reviewed:: Yes Do you feel safe going back to the place where you live?: Yes      Need for Family Participation in Patient Care: Yes (Comment) Care giver support system in place?: Yes (comment)(pt son/spouse)   Criminal Activity/Legal Involvement Pertinent to Current  Situation/Hospitalization: No - Comment as needed  Activities of Daily Living Home Assistive Devices/Equipment: None ADL Screening (condition at time of admission) Patient's cognitive ability adequate to safely complete daily activities?: Yes Is the patient deaf or have difficulty hearing?: Yes(HOH sometimes) Does the patient have difficulty seeing, even when wearing glasses/contacts?: No Does the patient have difficulty concentrating, remembering, or making decisions?: No Patient able to express need for assistance with ADLs?: Yes Does the patient have difficulty dressing or bathing?: Yes Independently performs ADLs?: No Communication: Independent Dressing (OT): Needs assistance Is this a change from baseline?: Pre-admission baseline Grooming: Independent Feeding: Independent Bathing: Needs assistance Is this a change from baseline?: Pre-admission baseline Toileting: Needs assistance Is this a change from baseline?: Pre-admission baseline In/Out Bed: Needs assistance Is this a change from baseline?: Pre-admission baseline Walks in Home: Needs assistance Is this a change from baseline?: Pre-admission baseline Does the patient have difficulty walking or climbing stairs?: Yes Weakness of Legs: Both Weakness of Arms/Hands: None  Permission Sought/Granted Permission sought to share information with : Family Supports Permission granted to share information with : Yes, Verbal Permission Granted  Share Information with NAME: [name] son  Permission granted to share info w AGENCY: [facility]/SNFs/HH  Permission granted to share info w Relationship: son  Permission granted to share info w Contact Information: [phone]  Emotional Assessment Appearance:: Appears stated age Attitude/Demeanor/Rapport: Engaged Affect (typically observed): Accepting Orientation: : Oriented to Self, Oriented  to Place, Oriented to  Time, Oriented to Situation Alcohol / Substance Use: Not  Applicable Psych Involvement: No (comment)  Admission diagnosis:  Fever, unspecified fever cause [R50.9] Congestive heart failure, unspecified HF chronicity, unspecified heart failure type [facility]) [I50.9] Patient Active Problem List   Diagnosis Date Noted  . Volume overload 07/29/2019  . Itching 03/22/2019  . Skin lesion 03/22/2019  . Hyponatremia 12/24/2018  . (HFpEF) heart failure with preserved ejection fraction ([place]) 12/24/2018  . Precordial chest pain   . Neck pain 09/12/2018  . CKD (chronic kidney disease), stage III 12/18/2017  . Symptomatic anemia 12/18/2017  . Peptic ulcer disease 12/18/2017  . GI bleed 09/29/2017  . SOB (shortness of breath) 09/21/2017  . Morbid obesity ([place]) 02/05/2016  . Numerous moles 09/15/2015  . S/P CABG x 3 03/31/2014  . Hepatic cirrhosis ([place]) 02/27/2014  . Acute blood loss anemia 08/13/2012  . Hyperlipidemia with target LDL less than 70 06/29/2012  . Thrombocytopenia ([place]) 06/29/2012  . Coronary artery disease    . OSA (obstructive sleep apnea)   . Type 2 diabetes with complication ([place]) 68/03/8109  . GERD 08/04/2010  . History of gout   . Hypertensive heart disease    PCP:  John Koch, MD Pharmacy:   [facility] [place], [address] AT [address] [place] [address] Phone: [phone] Fax: [phone]     Social Determinants of Health ([place]) Interventions    Readmission Risk Interventions Readmission Risk Prevention Plan 08/01/2019  Transportation Screening Complete  PCP or Specialist Appt within 5-7 Days Not Complete  Not Complete comments disposition pending  Home Care Screening Complete  Medication Review (RN CM) Complete  Some recent data might be hidden

## 2019-08-06 NOTE — Progress Notes (Signed)
PROGRESS NOTE    [name]  UXL:[id number] DOB: 1946/05/31 DOA: 07/29/2019 PCP: [name], MD    Brief Narrative:  73 y.o.malewith medical history significant forHFpEF (EF 60-65%), CAD s/p DES and CABG, hepatic cirrhosis, CKD stage III, type 2 diabetes, hyperlipidemia, asthma, anemia of chronic disease, anduntreatedOSA who presents to the ED for evaluation of volume overload, dyspnea, fatigue, weight gain, and swelling in both lower extremities. Portable chest x-ray showed moderate pulmonary edema. Patient reports continued progressive swelling in both of his legs. He has been having increased pain and difficulty walking now. In the ED, vital signs fairly stable, saturating well on room air.  Covid test negative.  Patient admitted for further management.  Assessment & Plan:   Principal Problem:   Volume overload Active Problems:   Type 2 diabetes with complication (HCC)   Coronary artery disease    Hyperlipidemia with target LDL less than 70   Hepatic cirrhosis (HCC)   S/P CABG x 3   CKD (chronic kidney disease), stage III   (HFpEF) heart failure with preserved ejection fraction (HCC)  Acute on chronic diastolic HF -Clinically volume overloaded but now improving -Presenting BNP 223 -Will continue IV Lasix 60 mg twice daily for now -Bilateral Doppler personally reviewed, no evidence of DVT -PT/OT eval -Repeat 2d echo reviewed with EF of 55-60% -Recent urine output was not measured as pt was flushing down the toilet, however pt reports very good diuresis -Will ensure strict I/O's -LE continuing to improve. Will continue current lasix dose  CAD s/p CABG and DES -Chest pain-free at this time -Not on antiplatelet therapy due to history of upper GI bleed -Remains stable at this time  Hepatic cirrhosis -Wihtout evidence of acute decompensation or encephalopathy -Continue spironolactone plus IV Lasix as tolerated -LFT's reviewed, LFT trends had remained  stable  Type 2 diabetes mellitus -Last A1c 6.1 -Continue SSI, Accu-Cheks, hypoglycemic protocol -Continuing to hold home Metformin while in hospital -Glucose peaked to 300's recently -Improved with Heart healthy with carb mod diet  Anemia of chronic disease/History ofupperGI bleed -Hemoglobin at baseline -Remains hemodynamically stable currently  Hypertension -Continue home spironolactone -continue IV Lasix as tolerated per above  CKD stage III -Creatinine at baseline, stable -Repeat BMET in AM  Asthma: -Continue home Anoro Ellipta and as needed albuterol -Stable currently at this time  OSA -Continue nocturnal supplemental O2 via [place].  DVT prophylaxis: lovenox subQ Code Status: Full Family Communication: Pt in room, family not at bedside Disposition Plan: Uncertain at this time  Consultants:     Procedures:     Antimicrobials: Anti-infectives (From admission, onward)   Start     Dose/Rate Route Frequency Ordered Stop   07/29/19 1815  doxycycline (VIBRA-TABS) tablet 100 mg     100 mg Oral  Once 07/29/19 1806 07/29/19 1841      Subjective: Without complaints this AM. States swelling seems improved  Objective: Vitals:   08/05/19 2237 08/06/19 0439 08/06/19 0750 08/06/19 1208  BP: (!) 166/69 (!) 121/41  (!) 146/60  Pulse: 75 73 72 71  Resp: 16 18 18 16   Temp: 98.4 F (36.9 C) 98.2 F (36.8 C)  98.9 F (37.2 C)  TempSrc: Oral Oral  Oral  SpO2: 97% 96% 98% 96%  Weight:      Height:        Intake/Output Summary (Last 24 hours) at 08/06/2019 1400 Last data filed at 08/06/2019 0944 Gross per 24 hour  Intake 723 ml  Output -  Net 723 ml   Filed Weights   08/01/19 0500 08/03/19 0517 08/05/19 0552  Weight: 112.9 kg 108.3 kg 106.6 kg    Examination: General exam: Conversant, in no acute distress Respiratory system: normal chest rise, clear, no audible wheezing Cardiovascular system: regular rhythm, s1-s2 Gastrointestinal system:  Nondistended, nontender, pos BS Central nervous system: No seizures, no tremors Extremities: No cyanosis, no joint deformities, BLE edema continuing to improve Skin: No rashes, no pallor Psychiatry: Affect normal // no auditory hallucinations   Data Reviewed: I have personally reviewed following labs and imaging studies  CBC: Recent Labs  Lab 08/02/19 0517 08/03/19 0534 08/04/19 0617 08/05/19 0712 08/06/19 0431  WBC 4.2 4.3 4.4 4.7 4.8  NEUTROABS 2.8 2.8 2.7 3.0 2.9  HGB 8.5* 8.8* 9.3* 9.7* 9.3*  HCT 29.0* 29.6* 31.9* 32.5* 32.1*  MCV 84.1 84.1 84.4 83.1 83.2  PLT 159 171 212 221 902   Basic Metabolic Panel: Recent Labs  Lab 08/02/19 0517 08/03/19 0534 08/04/19 0617 08/05/19 0712 08/06/19 0431  NA 133* 136 134* 134* 136  K 4.1 4.1 4.2 4.1 4.2  CL 97* 99 98 97* 99  CO2 26 27 27 26 27   GLUCOSE 133* 137* 142* 179* 139*  BUN 24* 28* 24* 24* 26*  CREATININE 1.33* 1.27* 1.30* 1.33* 1.29*  CALCIUM 8.4* 8.6* 8.8* 9.1 8.9   GFR: Estimated Creatinine Clearance: 59.4 mL/min (A) (by C-G formula based on SCr of 1.29 mg/dL (H)). Liver Function Tests: Recent Labs  Lab 08/02/19 0517 08/05/19 0712 08/06/19 0431  AST 24 30 29   ALT 20 23 22   ALKPHOS 125 164* 177*  BILITOT 0.7 0.2* 0.5  PROT 6.9 7.1 7.3  ALBUMIN 3.0* 3.1* 3.0*   No results for input(s): LIPASE, AMYLASE in the last 168 hours. No results for input(s): AMMONIA in the last 168 hours. Coagulation Profile: No results for input(s): INR, PROTIME in the last 168 hours. Cardiac Enzymes: No results for input(s): CKTOTAL, CKMB, CKMBINDEX, TROPONINI in the last 168 hours. BNP (last 3 results) No results for input(s): PROBNP in the last 8760 hours. HbA1C: No results for input(s): HGBA1C in the last 72 hours. CBG: Recent Labs  Lab 08/05/19 1101 08/05/19 1632 08/05/19 2155 08/06/19 0634 08/06/19 1105  GLUCAP 165* 163* 164* 131* 156*   Lipid Profile: No results for input(s): CHOL, HDL, LDLCALC, TRIG, CHOLHDL,  LDLDIRECT in the last 72 hours. Thyroid Function Tests: No results for input(s): TSH, T4TOTAL, FREET4, T3FREE, THYROIDAB in the last 72 hours. Anemia Panel: No results for input(s): VITAMINB12, FOLATE, FERRITIN, TIBC, IRON, RETICCTPCT in the last 72 hours. Sepsis Labs: No results for input(s): PROCALCITON, LATICACIDVEN in the last 168 hours.  Recent Results (from the past 240 hour(s))  SARS CORONAVIRUS 2 (TAT 6-24 HRS) Nasopharyngeal Nasopharyngeal Swab     Status: None   Collection Time: 07/29/19  6:06 PM   Specimen: Nasopharyngeal Swab  Result Value Ref Range Status   SARS Coronavirus 2 NEGATIVE NEGATIVE Final    Comment: (NOTE) SARS-CoV-2 target nucleic acids are NOT DETECTED. The SARS-CoV-2 RNA is generally detectable in upper and lower respiratory specimens during the acute phase of infection. Negative results do not preclude SARS-CoV-2 infection, do not rule out co-infections with other pathogens, and should not be used as the sole basis for treatment or other patient management decisions. Negative results must be combined with clinical observations, patient history, and epidemiological information. The expected result is Negative. Fact Sheet for Patients: SugarRoll.be Fact Sheet for Healthcare Providers: https://www.woods-mathews.com/ This  test is not yet approved or cleared by the [place] and  has been authorized for detection and/or diagnosis of SARS-CoV-2 by FDA under an Emergency Use Authorization (EUA). This EUA will remain  in effect (meaning this test can be used) for the duration of the COVID-19 declaration under Section 56 4(b)(1) of the Act, 21 U.S.C. section 360bbb-3(b)(1), unless the authorization is terminated or revoked sooner. Performed at [facility], [place] [address] [place], [place] [address]      Radiology Studies: No results found.  Scheduled Meds: . enoxaparin (LOVENOX) injection  40 mg  Subcutaneous Q24H  . ferrous sulfate  325 mg Oral Q supper  . furosemide  60 mg Intravenous Q12H  . gabapentin  100 mg Oral BID  . insulin aspart  0-9 Units Subcutaneous TID WC  . sodium chloride flush  3 mL Intravenous Once  . sodium chloride flush  3 mL Intravenous Q12H  . spironolactone  50 mg Oral Daily  . umeclidinium-vilanterol  1 puff Inhalation Daily   Continuous Infusions:   LOS: 7 days   Marylu Lund, MD [facility] Pager On Amion  If 7PM-7AM, please contact night-coverage 08/06/2019, 2:00 PM

## 2019-08-07 ENCOUNTER — Other Ambulatory Visit: Payer: Self-pay | Admitting: Family Medicine

## 2019-08-07 DIAGNOSIS — N1831 Chronic kidney disease, stage 3a: Secondary | ICD-10-CM

## 2019-08-07 DIAGNOSIS — E785 Hyperlipidemia, unspecified: Secondary | ICD-10-CM

## 2019-08-07 LAB — CBC WITH DIFFERENTIAL/PLATELET
Abs Immature Granulocytes: 0.03 10*3/uL (ref 0.00–0.07)
Basophils Absolute: 0 10*3/uL (ref 0.0–0.1)
Basophils Relative: 1 %
Eosinophils Absolute: 0.2 10*3/uL (ref 0.0–0.5)
Eosinophils Relative: 4 %
HCT: 32.6 % — ABNORMAL LOW (ref 39.0–52.0)
Hemoglobin: 9.8 g/dL — ABNORMAL LOW (ref 13.0–17.0)
Immature Granulocytes: 1 %
Lymphocytes Relative: 20 %
Lymphs Abs: 1.1 10*3/uL (ref 0.7–4.0)
MCH: 24.5 pg — ABNORMAL LOW (ref 26.0–34.0)
MCHC: 30.1 g/dL (ref 30.0–36.0)
MCV: 81.5 fL (ref 80.0–100.0)
Monocytes Absolute: 0.5 10*3/uL (ref 0.1–1.0)
Monocytes Relative: 9 %
Neutro Abs: 3.5 10*3/uL (ref 1.7–7.7)
Neutrophils Relative %: 65 %
Platelets: 227 10*3/uL (ref 150–400)
RBC: 4 MIL/uL — ABNORMAL LOW (ref 4.22–5.81)
RDW: 14.6 % (ref 11.5–15.5)
WBC: 5.3 10*3/uL (ref 4.0–10.5)
nRBC: 0 % (ref 0.0–0.2)

## 2019-08-07 LAB — GLUCOSE, CAPILLARY
Glucose-Capillary: 120 mg/dL — ABNORMAL HIGH (ref 70–99)
Glucose-Capillary: 160 mg/dL — ABNORMAL HIGH (ref 70–99)

## 2019-08-07 MED ORDER — FUROSEMIDE 20 MG PO TABS: 20.0000 mg | ORAL_TABLET | Freq: Two times a day (BID) | ORAL | 0 refills | Status: DC

## 2019-08-07 NOTE — Discharge Summary (Addendum)
Physician Discharge Summary  [name] BWL:[id number] DOB: November 19, 1945 DOA: 07/29/2019  PCP: [name], MD  Admit date: 07/29/2019 Discharge date: 08/07/2019  Admitted From: home Disposition:  home  Recommendations for Outpatient Follow-up:  1. Follow up with PCP in 1-2 weeks 2. Please obtain BMP/CBC in one week 3. Please follow up on the following pending results:  Home Health:none  Equipment/Devices:none   Discharge Condition: Stable CODE STATUS: Full code Diet recommendation: Cardiac, low-salt  Brief/Interim Summary: Patient admitted on 11/34 heart failure and volume overloaded state.  Patient diuresed with 60 mg of Lasix IV twice a day and had excellent diuresis.  His breathing and leg swelling improved dramatically over the course of his hospitalization.  Overall, the patient has had adequate diuresis and is below his dry weight.  He is currently on room air and without dyspnea on exertion.  He is stable for discharge today.  Discharge Diagnoses:  Principal Problem:   Volume overload Active Problems:   Type 2 diabetes with complication (HCC)   Coronary artery disease    Hyperlipidemia with target LDL less than 70   Hepatic cirrhosis (HCC)   S/P CABG x 3   CKD (chronic kidney disease), stage III   (HFpEF) heart failure with preserved ejection fraction [facility])    Discharge Instructions  Discharge Instructions    Call MD for:  difficulty breathing, headache or visual disturbances   Complete by: As directed    Call MD for:  extreme fatigue   Complete by: As directed    Call MD for:  hives   Complete by: As directed    Call MD for:  persistant dizziness or light-headedness   Complete by: As directed    Call MD for:  persistant nausea and vomiting   Complete by: As directed    Call MD for:  redness, tenderness, or signs of infection (pain, swelling, redness, odor or green/yellow discharge around incision site)   Complete by: As directed    Call MD for:   severe uncontrolled pain   Complete by: As directed    Call MD for:  temperature >100.4   Complete by: As directed    Diet - low sodium heart healthy   Complete by: As directed    Increase activity slowly   Complete by: As directed      Allergies as of 08/07/2019   No Known Allergies     Medication List    TAKE these medications   albuterol 108 (90 Base) MCG/ACT inhaler Commonly known as: VENTOLIN HFA Inhale 2 puffs into the lungs every 6 (six) hours as needed for wheezing or shortness of breath.   blood glucose meter kit and supplies Dispense based on patient and insurance preference. Use up to four times daily as directed. (FOR ICD-10 E10.9, E11.9).   ferrous sulfate 325 (65 FE) MG EC tablet Take 1 tablet (325 mg total) by mouth daily with breakfast. What changed: when to take this   furosemide 20 MG tablet Commonly known as: LASIX Take 1 tablet (20 mg total) by mouth 2 (two) times daily.   gabapentin 100 MG capsule Commonly known as: NEURONTIN TAKE 1 CAPSULE(100 MG) BY MOUTH TWICE DAILY What changed:   how much to take  how to take this  when to take this  additional instructions   metFORMIN 850 MG tablet Commonly known as: GLUCOPHAGE Take 1 tablet (850 mg total) by mouth 2 (two) times daily with a meal.   nitroGLYCERIN 0.4 MG  SL tablet Commonly known as: NITROSTAT Place 1 tablet (0.4 mg total) under the tongue every 5 (five) minutes as needed for chest pain.   spironolactone 50 MG tablet Commonly known as: ALDACTONE Take 1 tablet (50 mg total) by mouth daily.   umeclidinium-vilanterol 62.5-25 MCG/INH Aepb Commonly known as: Anoro Ellipta Inhale 1 puff into the lungs daily.      Follow-up Information    [name], MD. Go on 08/12/2019.   Specialty: Internal Medicine Why: Wear a mask. Bring hospital paperwork with you. Your appointment is at 2pm.  Contact information: [place] [address] [phone]         Nahser, Wonda Cheng, MD .   Specialty: Cardiology Contact information: [address] [place] [address] [phone]        [facility] Follow up.   Specialty: Home Health Services Contact information: [place] [address] [phone]          No Known Allergies  Consultations:  None   Procedures/Studies: Dg Chest Portable 1 View  Result Date: 07/29/2019 CLINICAL DATA:  Shortness of breath and fever EXAM: PORTABLE CHEST 1 VIEW COMPARISON:  July 27, 2019. FINDINGS: There are areas of mild scarring bilaterally. No frank edema or consolidation. Heart is mildly enlarged with pulmonary vascularity within normal limits. There is aortic atherosclerosis. Patient is status post coronary artery bypass grafting. No adenopathy. No bone lesions. IMPRESSION: Scattered areas of lung scarring bilaterally. No frank edema or consolidation. Heart is mildly enlarged with pulmonary vascularity normal. Postoperative changes noted. No adenopathy. Aortic Atherosclerosis (ICD10-I70.0). Electronically Signed   By: Lowella Grip III M.D.   On: 07/29/2019 14:36   Dg Chest Port 1 View  Result Date: 07/27/2019 CLINICAL DATA:  73 year old presenting with a 1 week history of shortness of breath and BILATERAL lower extremity edema. EXAM: PORTABLE CHEST 1 VIEW COMPARISON:  12/24/2018 and earlier. FINDINGS: Prior sternotomy for CABG. Cardiac silhouette moderately enlarged, unchanged. Moderate diffuse interstitial pulmonary edema throughout both lungs. No confluent airspace consolidation. No visible pleural effusions. IMPRESSION: Moderate CHF and/or fluid overload, with stable moderate cardiomegaly and moderate diffuse interstitial pulmonary edema throughout both lungs. Electronically Signed   By: Evangeline Dakin M.D.   On: 07/27/2019 13:49   Vas [place] Lower Extremity Venous (dvt)  Result Date: 07/30/2019  Lower Venous Study Indications: Edema.   Limitations: Body habitus. Comparison Study: 11/20/17 negative Performing Technologist: June Leap RDMS, RVT  Examination Guidelines: A complete evaluation includes B-mode imaging, spectral Doppler, color Doppler, and power Doppler as needed of all accessible portions of each vessel. Bilateral testing is considered an integral part of a complete examination. Limited examinations for reoccurring indications may be performed as noted.  +---------+---------------+---------+-----------+----------+-------------------+ RIGHT    CompressibilityPhasicitySpontaneityPropertiesThrombus Aging      +---------+---------------+---------+-----------+----------+-------------------+ CFV      Full           Yes      Yes                                      +---------+---------------+---------+-----------+----------+-------------------+ SFJ      Full                                                             +---------+---------------+---------+-----------+----------+-------------------+  FV Prox  Full                                                             +---------+---------------+---------+-----------+----------+-------------------+ FV Mid   Full                                                             +---------+---------------+---------+-----------+----------+-------------------+ FV Distal               Yes      Yes                                      +---------+---------------+---------+-----------+----------+-------------------+ PFV      Full                                                             +---------+---------------+---------+-----------+----------+-------------------+ POP      Full           Yes      Yes                                      +---------+---------------+---------+-----------+----------+-------------------+ PTV      Full                                         not well visualized  +---------+---------------+---------+-----------+----------+-------------------+ PERO     Full                                         not well visualized +---------+---------------+---------+-----------+----------+-------------------+   Right Technical Findings: Pulsatile venous flow suggestive of increased right side heart pressure.  +---------+---------------+---------+-----------+----------+-------------------+ LEFT     CompressibilityPhasicitySpontaneityPropertiesThrombus Aging      +---------+---------------+---------+-----------+----------+-------------------+ CFV      Full                                                             +---------+---------------+---------+-----------+----------+-------------------+ SFJ      Full                                                             +---------+---------------+---------+-----------+----------+-------------------+ FV Prox  Full                                                             +---------+---------------+---------+-----------+----------+-------------------+  FV Mid   Full                                                             +---------+---------------+---------+-----------+----------+-------------------+ FV DistalFull                                                             +---------+---------------+---------+-----------+----------+-------------------+ PFV      Full                                                             +---------+---------------+---------+-----------+----------+-------------------+ POP                     Yes      Yes                                      +---------+---------------+---------+-----------+----------+-------------------+ PTV      Full                                         not well visualized +---------+---------------+---------+-----------+----------+-------------------+ PERO     Full                                         not  well visualized +---------+---------------+---------+-----------+----------+-------------------+   Left Technical Findings: Pulsatile venous flow suggestive of increased right side heart pressure.   Summary: Right: There is no evidence of deep vein thrombosis proximal to the inguinal ligament or in the common femoral vein. No cystic structure found in the popliteal fossa. Left: There is no evidence of deep vein thrombosis proximal to the inguinal ligament or in the common femoral vein. A cystic structure is found in the popliteal fossa.  *See table(s) above for measurements and observations. Electronically signed by Deitra Mayo MD on 07/30/2019 at 5:38:24 PM.    Final        Subjective: Patient doing well and is free of chest pain or shortness of breath.  He feels that he is at his baseline  Discharge Exam: Vitals:   08/06/19 2316 08/07/19 0517  BP: (!) 141/76 (!) 153/75  Pulse: 71 69  Resp: 18 18  Temp: 98.1 F (36.7 C) 97.9 F (36.6 C)  SpO2: 99% 99%   Vitals:   08/06/19 1832 08/06/19 2316 08/07/19 0509 08/07/19 0517  BP: 137/65 (!) 141/76  (!) 153/75  Pulse: 72 71  69  Resp: '20 18  18  ' Temp: 97.7 F (36.5 C) 98.1 F (36.7 C)  97.9 F (36.6 C)  TempSrc: Oral Oral  Oral  SpO2: 98% 99%  99%  Weight:   104.7 kg   Height:  General: Pt is alert, awake, not in acute distress Cardiovascular: RRR, S1/S2 +, no rubs, no gallops Respiratory: CTA bilaterally, no wheezing, no rhonchi Abdominal: Soft, NT, ND, bowel sounds + Extremities: no edema, no cyanosis    The results of significant diagnostics from this hospitalization (including imaging, microbiology, ancillary and laboratory) are listed below for reference.     Microbiology: Recent Results (from the past 240 hour(s))  SARS CORONAVIRUS 2 (TAT 6-24 HRS) Nasopharyngeal Nasopharyngeal Swab     Status: None   Collection Time: 07/29/19  6:06 PM   Specimen: Nasopharyngeal Swab  Result Value Ref Range Status    SARS Coronavirus 2 NEGATIVE NEGATIVE Final    Comment: (NOTE) SARS-CoV-2 target nucleic acids are NOT DETECTED. The SARS-CoV-2 RNA is generally detectable in upper and lower respiratory specimens during the acute phase of infection. Negative results do not preclude SARS-CoV-2 infection, do not rule out co-infections with other pathogens, and should not be used as the sole basis for treatment or other patient management decisions. Negative results must be combined with clinical observations, patient history, and epidemiological information. The expected result is Negative. Fact Sheet for Patients: SugarRoll.be Fact Sheet for Healthcare Providers: https://www.woods-mathews.com/ This test is not yet approved or cleared by the [place] FDA and  has been authorized for detection and/or diagnosis of SARS-CoV-2 by FDA under an Emergency Use Authorization (EUA). This EUA will remain  in effect (meaning this test can be used) for the duration of the COVID-19 declaration under Section 56 4(b)(1) of the Act, 21 U.S.C. section 360bbb-3(b)(1), unless the authorization is terminated or revoked sooner. Performed at [facility], [place] [address] [place], [place] [address]      Labs: BNP (last 3 results) Recent Labs    12/24/18 0850 07/27/19 1257 07/29/19 2100  BNP 244.7* 228.3* 956.3*   Basic Metabolic Panel: Recent Labs  Lab 08/02/19 0517 08/03/19 0534 08/04/19 0617 08/05/19 0712 08/06/19 0431  NA 133* 136 134* 134* 136  K 4.1 4.1 4.2 4.1 4.2  CL 97* 99 98 97* 99  CO2 '26 27 27 26 27  ' GLUCOSE 133* 137* 142* 179* 139*  BUN 24* 28* 24* 24* 26*  CREATININE 1.33* 1.27* 1.30* 1.33* 1.29*  CALCIUM 8.4* 8.6* 8.8* 9.1 8.9   Liver Function Tests: Recent Labs  Lab 08/02/19 0517 08/05/19 0712 08/06/19 0431  AST '24 30 29  ' ALT '20 23 22  ' ALKPHOS 125 164* 177*  BILITOT 0.7 0.2* 0.5  PROT 6.9 7.1 7.3  ALBUMIN 3.0* 3.1* 3.0*   No  results for input(s): LIPASE, AMYLASE in the last 168 hours. No results for input(s): AMMONIA in the last 168 hours. CBC: Recent Labs  Lab 08/03/19 0534 08/04/19 0617 08/05/19 0712 08/06/19 0431 08/07/19 0524  WBC 4.3 4.4 4.7 4.8 5.3  NEUTROABS 2.8 2.7 3.0 2.9 3.5  HGB 8.8* 9.3* 9.7* 9.3* 9.8*  HCT 29.6* 31.9* 32.5* 32.1* 32.6*  MCV 84.1 84.4 83.1 83.2 81.5  PLT 171 212 221 220 227   Cardiac Enzymes: No results for input(s): CKTOTAL, CKMB, CKMBINDEX, TROPONINI in the last 168 hours. BNP: Invalid input(s): POCBNP CBG: Recent Labs  Lab 08/06/19 0634 08/06/19 1105 08/06/19 1616 08/06/19 2119 08/07/19 0525  GLUCAP 131* 156* 139* 219* 120*   D-Dimer No results for input(s): DDIMER in the last 72 hours. Hgb A1c No results for input(s): HGBA1C in the last 72 hours. Lipid Profile No results for input(s): CHOL, HDL, LDLCALC, TRIG, CHOLHDL, LDLDIRECT in the last 72 hours.  Thyroid function studies No results for input(s): TSH, T4TOTAL, T3FREE, THYROIDAB in the last 72 hours.  Invalid input(s): FREET3 Anemia work up No results for input(s): VITAMINB12, FOLATE, FERRITIN, TIBC, IRON, RETICCTPCT in the last 72 hours. Urinalysis    Component Value Date/Time   COLORURINE YELLOW 07/29/2019 1849   APPEARANCEUR CLEAR 07/29/2019 1849   LABSPEC 1.010 07/29/2019 1849   PHURINE 6.0 07/29/2019 1849   GLUCOSEU NEGATIVE 07/29/2019 1849   HGBUR NEGATIVE 07/29/2019 1849   BILIRUBINUR NEGATIVE 07/29/2019 1849   KETONESUR NEGATIVE 07/29/2019 1849   PROTEINUR NEGATIVE 07/29/2019 1849   UROBILINOGEN 0.2 03/29/2014 1546   NITRITE NEGATIVE 07/29/2019 1849   LEUKOCYTESUR NEGATIVE 07/29/2019 1849   Sepsis Labs Invalid input(s): PROCALCITONIN,  WBC,  LACTICIDVEN Microbiology Recent Results (from the past 240 hour(s))  SARS CORONAVIRUS 2 (TAT 6-24 HRS) Nasopharyngeal Nasopharyngeal Swab     Status: None   Collection Time: 07/29/19  6:06 PM   Specimen: Nasopharyngeal Swab  Result Value Ref  Range Status   SARS Coronavirus 2 NEGATIVE NEGATIVE Final    Comment: (NOTE) SARS-CoV-2 target nucleic acids are NOT DETECTED. The SARS-CoV-2 RNA is generally detectable in upper and lower respiratory specimens during the acute phase of infection. Negative results do not preclude SARS-CoV-2 infection, do not rule out co-infections with other pathogens, and should not be used as the sole basis for treatment or other patient management decisions. Negative results must be combined with clinical observations, patient history, and epidemiological information. The expected result is Negative. Fact Sheet for Patients: SugarRoll.be Fact Sheet for Healthcare Providers: https://www.woods-mathews.com/ This test is not yet approved or cleared by the [place] FDA and  has been authorized for detection and/or diagnosis of SARS-CoV-2 by FDA under an Emergency Use Authorization (EUA). This EUA will remain  in effect (meaning this test can be used) for the duration of the COVID-19 declaration under Section 56 4(b)(1) of the Act, 21 U.S.C. section 360bbb-3(b)(1), unless the authorization is terminated or revoked sooner. Performed at [facility], [place] [address] [place], [place] [address]      Time coordinating discharge: Over 30 minutes  SIGNED:   Truett Mainland, DO [facility] 08/07/2019, 10:33 AM Pager   If 7PM-7AM, please contact night-coverage [email or web address] Password [email or web address]

## 2019-08-07 NOTE — TOC Transition Note (Signed)
Transition of Care [facility]) - CM/SW Discharge Note   Patient Details  Name: [name] MRN: [id number] Date of Birth: Oct 09, 1945  Transition of Care [facility]) CM/SW Contact:  Marilu Favre, RN Phone Number: 08/07/2019, 12:05 PM   Clinical Narrative:     [name] with [name] aware discharge today   Final next level of care: [place] Barriers to Discharge: Continued Medical Work up   Patient Goals and CMS Choice Patient states their goals for this hospitalization and ongoing recovery are:: to return to home CMS Medicare.gov Compare Post Acute Care list provided to:: Patient Choice offered to / list presented to : Patient, Adult Children  Discharge Placement                       Discharge Plan and Services In-house Referral: Clinical Social Work Discharge Planning Services: CM Consult Post Acute Care Choice: Home Health          DME Arranged: N/A         HH Arranged: PT, OT          Social Determinants of Health (SDOH) Interventions     Readmission Risk Interventions Readmission Risk Prevention Plan 08/01/2019  Transportation Screening Complete  PCP or Specialist Appt within 5-7 Days Not Complete  Not Complete comments disposition pending  Home Care Screening Complete  Medication Review (RN CM) Complete  Some recent data might be hidden

## 2019-08-07 NOTE — Plan of Care (Signed)
Problem: Education: Goal: Knowledge of General Education information will improve Description: Including pain rating scale, medication(s)/side effects and non-pharmacologic comfort measures 08/07/2019 1119 by Milderd Meager, RN Outcome: Adequate for Discharge 08/07/2019 0937 by Milderd Meager, RN Outcome: Progressing   Problem: Health Behavior/Discharge Planning: Goal: Ability to manage health-related needs will improve 08/07/2019 1119 by Milderd Meager, RN Outcome: Adequate for Discharge 08/07/2019 6010 by Milderd Meager, RN Outcome: Progressing   Problem: Clinical Measurements: Goal: Ability to maintain clinical measurements within normal limits will improve 08/07/2019 1119 by Milderd Meager, RN Outcome: Adequate for Discharge 08/07/2019 9323 by Milderd Meager, RN Outcome: Progressing Goal: Will remain free from infection 08/07/2019 1119 by Milderd Meager, RN Outcome: Adequate for Discharge 08/07/2019 5573 by Milderd Meager, RN Outcome: Progressing Goal: Diagnostic test results will improve 08/07/2019 1119 by Milderd Meager, RN Outcome: Adequate for Discharge 08/07/2019 2202 by Milderd Meager, RN Outcome: Progressing Goal: Respiratory complications will improve 08/07/2019 1119 by Milderd Meager, RN Outcome: Adequate for Discharge 08/07/2019 5427 by Milderd Meager, RN Outcome: Progressing Goal: Cardiovascular complication will be avoided 08/07/2019 1119 by Milderd Meager, RN Outcome: Adequate for Discharge 08/07/2019 0623 by Milderd Meager, RN Outcome: Progressing   Problem: Activity: Goal: Risk for activity intolerance will decrease 08/07/2019 1119 by Milderd Meager, RN Outcome: Adequate for Discharge 08/07/2019 7628 by Milderd Meager, RN Outcome: Progressing   Problem: Nutrition: Goal: Adequate nutrition will be maintained 08/07/2019 1119 by Milderd Meager, RN Outcome: Adequate for Discharge 08/07/2019 0937 by  Milderd Meager, RN Outcome: Progressing   Problem: Coping: Goal: Level of anxiety will decrease 08/07/2019 1119 by Milderd Meager, RN Outcome: Adequate for Discharge 08/07/2019 0937 by Milderd Meager, RN Outcome: Progressing   Problem: Elimination: Goal: Will not experience complications related to bowel motility 08/07/2019 1119 by Milderd Meager, RN Outcome: Adequate for Discharge 08/07/2019 0937 by Milderd Meager, RN Outcome: Progressing Goal: Will not experience complications related to urinary retention 08/07/2019 1119 by Milderd Meager, RN Outcome: Adequate for Discharge 08/07/2019 3151 by Milderd Meager, RN Outcome: Progressing   Problem: Pain Managment: Goal: General experience of comfort will improve 08/07/2019 1119 by Milderd Meager, RN Outcome: Adequate for Discharge 08/07/2019 7616 by Milderd Meager, RN Outcome: Progressing   Problem: Safety: Goal: Ability to remain free from injury will improve 08/07/2019 1119 by Milderd Meager, RN Outcome: Adequate for Discharge 08/07/2019 0737 by Milderd Meager, RN Outcome: Progressing   Problem: Skin Integrity: Goal: Risk for impaired skin integrity will decrease 08/07/2019 1119 by Milderd Meager, RN Outcome: Adequate for Discharge 08/07/2019 1062 by Milderd Meager, RN Outcome: Progressing   Problem: Education: Goal: Ability to demonstrate management of disease process will improve 08/07/2019 1119 by Milderd Meager, RN Outcome: Adequate for Discharge 08/07/2019 6948 by Milderd Meager, RN Outcome: Progressing Goal: Ability to verbalize understanding of medication therapies will improve 08/07/2019 1119 by Milderd Meager, RN Outcome: Adequate for Discharge 08/07/2019 5462 by Milderd Meager, RN Outcome: Progressing Goal: Individualized Educational Video(s) 08/07/2019 1119 by Milderd Meager, RN Outcome: Adequate for Discharge 08/07/2019 0937 by Milderd Meager,  RN Outcome: Progressing   Problem: Activity: Goal: Capacity to carry out activities will improve 08/07/2019 1119 by Milderd Meager, RN Outcome: Adequate for Discharge 08/07/2019 7035 by Milderd Meager, RN Outcome: Progressing   Problem: Cardiac: Goal: Ability to achieve and maintain adequate cardiopulmonary perfusion  will improve 08/07/2019 1119 by Milderd Meager, RN Outcome: Adequate for Discharge 08/07/2019 8006 by Milderd Meager, RN Outcome: Progressing

## 2019-08-07 NOTE — Plan of Care (Signed)

## 2019-08-07 NOTE — Discharge Instructions (Signed)
Heart Failure, Self Care °Heart failure is a serious condition. This sheet explains things you need to do to take care of yourself at home. To help you stay as healthy as possible, you may be asked to change your diet, take certain medicines, and make other changes in your life. Your doctor may also give you more specific instructions. If you have problems or questions, call your doctor. °What are the risks? °Having heart failure makes it more likely for you to have some problems. These problems can get worse if you do not take good care of yourself. Problems may include: °· Blood clotting problems. This may cause a stroke. °· Damage to the kidneys, liver, or lungs. °· Abnormal heart rhythms. °Supplies needed: °· Scale for weighing yourself. °· Blood pressure monitor. °· Notebook. °· Medicines. °How to care for yourself when you have heart failure °Medicines °Take over-the-counter and prescription medicines only as told by your doctor. Take your medicines every day. °· Do not stop taking your medicine unless your doctor tells you to do so. °· Do not skip any medicines. °· Get your prescriptions refilled before you run out of medicine. This is important. °Eating and drinking ° °· Eat heart-healthy foods. Talk with a diet specialist (dietitian) to create an eating plan. °· Choose foods that: °? Have no trans fat. °? Are low in saturated fat and cholesterol. °· Choose healthy foods, such as: °? Fresh or frozen fruits and vegetables. °? Fish. °? Low-fat (lean) meats. °? Legumes, such as beans, peas, and lentils. °? Fat-free or low-fat dairy products. °? Whole-grain foods. °? High-fiber foods. °· Limit salt (sodium) if told by your doctor. Ask your diet specialist to tell you which seasonings are healthy for your heart. °· Cook in healthy ways instead of frying. Healthy ways of cooking include roasting, grilling, broiling, baking, poaching, steaming, and stir-frying. °· Limit how much fluid you drink, if told by your  doctor. °Alcohol use °· Do not drink alcohol if: °? Your doctor tells you not to drink. °? Your heart was damaged by alcohol, or you have very bad heart failure. °? You are pregnant, may be pregnant, or are planning to become pregnant. °· If you drink alcohol: °? Limit how much you use to: °§ 0-1 drink a day for women. °§ 0-2 drinks a day for men. °? Be aware of how much alcohol is in your drink. In the [place] one drink equals one 12 oz bottle of beer (355 mL), one 5 oz glass of wine (148 mL), or one 1½ oz glass of hard liquor (44 mL). °Lifestyle ° °· Do not use any products that contain nicotine or tobacco, such as cigarettes, e-cigarettes, and chewing tobacco. If you need help quitting, ask your doctor. °? Do not use nicotine gum or patches before talking to your doctor. °· Do not use illegal drugs. °· Lose weight if told by your doctor. °· Do physical activity if told by your doctor. Talk to your doctor before you begin an exercise if: °? You are an older adult. °? You have very bad heart failure. °· Learn to manage stress. If you need help, ask your doctor. °· Get rehab (rehabilitation) to help you stay independent and to help with your quality of life. °· Plan time to rest when you get tired. °Check weight and blood pressure ° °· Weigh yourself every day. This will help you to know if fluid is building up in your body. °? Weigh yourself every morning   after you pee (urinate) and before you eat breakfast. °? Wear the same amount of clothing each time. °? Write down your daily weight. Give your record to your doctor. °· Check and write down your blood pressure as told by your doctor. °· Check your pulse as told by your doctor. °Dealing with very hot and very cold weather °· If it is very hot: °? Avoid activities that take a lot of energy. °? Use air conditioning or fans, or find a cooler place. °? Avoid caffeine and alcohol. °? Wear clothing that is loose-fitting, lightweight, and light-colored. °· If it is very  cold: °? Avoid activities that take a lot of energy. °? Layer your clothes. °? Wear mittens or gloves, a hat, and a scarf when you go outside. °? Avoid alcohol. °Follow these instructions at home: °· Stay up to date with shots (vaccines). Get pneumococcal and flu (influenza) shots. °· Keep all follow-up visits as told by your doctor. This is important. °Contact a doctor if: °· You gain weight quickly. °· You have increasing shortness of breath. °· You cannot do your normal activities. °· You get tired easily. °· You cough a lot. °· You don't feel like eating or feel like you may vomit (nauseous). °· You become puffy (swell) in your hands, feet, ankles, or belly (abdomen). °· You cannot sleep well because it is hard to breathe. °· You feel like your heart is beating fast (palpitations). °· You get dizzy when you stand up. °Get help right away if: °· You have trouble breathing. °· You or someone else notices a change in your behavior, such as having trouble staying awake. °· You have chest pain or discomfort. °· You pass out (faint). °These symptoms may be an emergency. Do not wait to see if the symptoms will go away. Get medical help right away. Call your local emergency services (911 in the [place] Do not drive yourself to the hospital. °Summary °· Heart failure is a serious condition. To care for yourself, you may have to change your diet, take medicines, and make other lifestyle changes. °· Take your medicines every day. Do not stop taking them unless your doctor tells you to do so. °· Eat heart-healthy foods, such as fresh or frozen fruits and vegetables, fish, lean meats, legumes, fat-free or low-fat dairy products, and whole-grain or high-fiber foods. °· Ask your doctor if you can drink alcohol. You may have to stop alcohol use if you have very bad heart failure. °· Contact your doctor if you gain weight quickly or feel that your heart is beating too fast. Get help right away if you pass out, or have chest pain  or trouble breathing. °This information is not intended to replace advice given to you by your health care provider. Make sure you discuss any questions you have with your health care provider. °Document Released: 11/28/2018 Document Revised: 11/27/2018 Document Reviewed: 11/28/2018 °Elsevier Patient Education © 2020 [employer] ° °

## 2019-08-08 ENCOUNTER — Telehealth: Payer: Self-pay | Admitting: *Deleted

## 2019-08-08 NOTE — Telephone Encounter (Signed)
Called pt concerning appt that's been made for 08/12/19. Spoke w/son verified hosp d/c on yesterday. Family was not aware of appt, and son states he has to take his mom to her appt on the 14th. Reschedule appt to the 10th @ 2, and completed TCM call below.[name]  Transition Care Management Follow-up Telephone Call   Date discharged? 08/07/19   How have you been since you were released from the hospital? Spoke w/son [name]) her states dad is doing ok   Do you understand why you were in the hospital? YES   Do you understand the discharge instructions? YES   Where were you discharged to? Home   Items Reviewed:  Medications reviewed: YES, he states there was no changes on his medications  Allergies reviewed: YES  Dietary changes reviewed: YES, low salt & carb modified due to being diabetic  Referrals reviewed: No referral recommeded   Functional Questionnaire:   Activities of Daily Living (ADLs):   He states he are independent in the following: bathing and hygiene, feeding, continence, grooming and toileting States they require assistance with the following: ambulation and dressing   Any transportation issues/concerns?: NO, son states he takes his parents to their appt   Any patient concerns? NO   Confirmed importance and date/time of follow-up visits scheduled YES, appt 08/13/19  Provider Appointment booked with Dr Sharlet Salina  Confirmed with patient if condition begins to worsen call PCP or go to the ER.  Patient was given the office number and encouraged to call back with question or concerns.  : YES

## 2019-08-10 ENCOUNTER — Other Ambulatory Visit: Payer: Self-pay | Admitting: Internal Medicine

## 2019-08-12 ENCOUNTER — Inpatient Hospital Stay: Payer: PPO | Admitting: Internal Medicine

## 2019-08-12 NOTE — Telephone Encounter (Signed)
Patient has an appointment for tomorrow

## 2019-08-12 NOTE — Telephone Encounter (Signed)
Not on current list, looks to have been stopped in the hospital. He should have follow up. If in SNF they would handle current meds until he gets back home.

## 2019-08-13 ENCOUNTER — Other Ambulatory Visit: Payer: Self-pay

## 2019-08-13 ENCOUNTER — Other Ambulatory Visit (INDEPENDENT_AMBULATORY_CARE_PROVIDER_SITE_OTHER): Payer: PPO

## 2019-08-13 ENCOUNTER — Ambulatory Visit (INDEPENDENT_AMBULATORY_CARE_PROVIDER_SITE_OTHER): Payer: PPO | Admitting: Internal Medicine

## 2019-08-13 ENCOUNTER — Encounter: Payer: Self-pay | Admitting: Internal Medicine

## 2019-08-13 VITALS — BP 126/78 | HR 69 | Temp 98.2°F | Ht 67.0 in | Wt 238.0 lb

## 2019-08-13 DIAGNOSIS — N183 Chronic kidney disease, stage 3 unspecified: Secondary | ICD-10-CM

## 2019-08-13 DIAGNOSIS — D696 Thrombocytopenia, unspecified: Secondary | ICD-10-CM

## 2019-08-13 DIAGNOSIS — I5032 Chronic diastolic (congestive) heart failure: Secondary | ICD-10-CM | POA: Diagnosis not present

## 2019-08-13 LAB — COMPREHENSIVE METABOLIC PANEL
ALT: 19 U/L (ref 0–53)
AST: 23 U/L (ref 0–37)
Albumin: 3.9 g/dL (ref 3.5–5.2)
Alkaline Phosphatase: 151 U/L — ABNORMAL HIGH (ref 39–117)
BUN: 18 mg/dL (ref 6–23)
CO2: 28 mEq/L (ref 19–32)
Calcium: 9.1 mg/dL (ref 8.4–10.5)
Chloride: 102 mEq/L (ref 96–112)
Creatinine, Ser: 1.05 mg/dL (ref 0.40–1.50)
GFR: 69.15 mL/min (ref >60.00)
Glucose, Bld: 111 mg/dL — ABNORMAL HIGH (ref 70–99)
Potassium: 4.2 mEq/L (ref 3.5–5.1)
Sodium: 136 mEq/L (ref 135–145)
Total Bilirubin: 0.4 mg/dL (ref 0.2–1.2)
Total Protein: 7.5 g/dL (ref 6.0–8.3)

## 2019-08-13 LAB — CBC
HCT: 31.9 % — ABNORMAL LOW (ref 39.0–52.0)
Hemoglobin: 10 g/dL — ABNORMAL LOW (ref 13.0–17.0)
MCHC: 31.4 g/dL (ref 30.0–36.0)
MCV: 78.7 fl (ref 78.0–100.0)
Platelets: 236 10*3/uL (ref 150.0–400.0)
RBC: 4.05 Mil/uL — ABNORMAL LOW (ref 4.22–5.81)
RDW: 16.6 % — ABNORMAL HIGH (ref 11.5–15.5)
WBC: 6 10*3/uL (ref 4.0–10.5)

## 2019-08-13 MED ORDER — TRAMADOL HCL 50 MG PO TABS: 50.0000 mg | ORAL_TABLET | Freq: Two times a day (BID) | ORAL | 0 refills | Status: AC | PRN

## 2019-08-13 NOTE — Assessment & Plan Note (Signed)
Stable weight.

## 2019-08-13 NOTE — Assessment & Plan Note (Signed)
Checking CBC but associated with his cirrhosis.

## 2019-08-13 NOTE — Patient Instructions (Signed)
We will send in tramadol for the pain that you can use twice a day as needed only if needed for the knee.   We will check the labs today.  Just take metformin for the sugars. Make sure to stay off the amaryl (glimepiride).

## 2019-08-13 NOTE — Assessment & Plan Note (Signed)
No flare today, counseled about importance of taking meds, dietary compliance.

## 2019-08-13 NOTE — Assessment & Plan Note (Signed)
Checking labs today for stability.

## 2019-08-13 NOTE — Progress Notes (Signed)
   Subjective:   Patient ID: [name], male    DOB: 1946/05/28, 73 y.o.   MRN: [id number]  HPI The patient is a 73 YO man coming in for hospital follow up (in for systolic heart failure, stopped taking meds and diet was high sodium, given IV lasix and diuresed a good amount in patient). He has been doing well. Having a lot of pain in the left knee. Taking his fluid pills since being home. They did stop glimepiride in the hospital due to low HgA1c 6.1. He was asking for refill and we wanted to discuss at visit first. Denies chest pains, SOB, cough, fevers or chills. Denies diarrhea or constipation.   PMH, [employer], social history reviewed and updated  Review of Systems  Constitutional: Positive for activity change.  HENT: Negative.   Eyes: Negative.   Respiratory: Negative for cough, chest tightness and shortness of breath.   Cardiovascular: Negative for chest pain, palpitations and leg swelling.  Gastrointestinal: Negative for abdominal distention, abdominal pain, constipation, diarrhea, nausea and vomiting.  Musculoskeletal: Positive for arthralgias.  Skin: Negative.   Neurological: Negative.   Psychiatric/Behavioral: Negative.     Objective:  Physical Exam Constitutional:      Appearance: He is well-developed. He is obese.  HENT:     Head: Normocephalic and atraumatic.  Cardiovascular:     Rate and Rhythm: Normal rate and regular rhythm.  Pulmonary:     Effort: Pulmonary effort is normal. No respiratory distress.     Breath sounds: Normal breath sounds. No wheezing or rales.  Abdominal:     General: Bowel sounds are normal. There is no distension.     Palpations: Abdomen is soft.     Tenderness: There is no abdominal tenderness. There is no rebound.  Musculoskeletal:     Cervical back: Normal range of motion.     Right lower leg: Edema present.     Left lower leg: Edema present.     Comments: Stable from usual  Skin:    General: Skin is warm and dry.  Neurological:   Mental Status: He is alert and oriented to person, place, and time.     Coordination: Coordination normal.     Vitals:   08/13/19 1403  BP: 126/78  Pulse: 69  Temp: 98.2 F (36.8 C)  TempSrc: Oral  SpO2: 98%  Weight: 238 lb (108 kg)  Height: 5\' 7"  (1.702 m)    This visit occurred during the SARS-CoV-2 public health emergency.  Safety protocols were in place, including screening questions prior to the visit, additional usage of staff PPE, and extensive cleaning of exam room while observing appropriate contact time as indicated for disinfecting solutions.   Assessment & Plan:

## 2019-09-20 ENCOUNTER — Other Ambulatory Visit: Payer: Self-pay | Admitting: Internal Medicine

## 2019-10-02 ENCOUNTER — Other Ambulatory Visit: Payer: Self-pay | Admitting: Internal Medicine

## 2019-10-08 DIAGNOSIS — Z85828 Personal history of other malignant neoplasm of skin: Secondary | ICD-10-CM | POA: Diagnosis not present

## 2019-10-08 DIAGNOSIS — L905 Scar conditions and fibrosis of skin: Secondary | ICD-10-CM | POA: Diagnosis not present

## 2019-10-08 DIAGNOSIS — D225 Melanocytic nevi of trunk: Secondary | ICD-10-CM | POA: Diagnosis not present

## 2019-10-08 DIAGNOSIS — D485 Neoplasm of uncertain behavior of skin: Secondary | ICD-10-CM | POA: Diagnosis not present

## 2019-10-08 DIAGNOSIS — L820 Inflamed seborrheic keratosis: Secondary | ICD-10-CM | POA: Diagnosis not present

## 2019-10-08 DIAGNOSIS — D1801 Hemangioma of skin and subcutaneous tissue: Secondary | ICD-10-CM | POA: Diagnosis not present

## 2019-10-08 DIAGNOSIS — L821 Other seborrheic keratosis: Secondary | ICD-10-CM | POA: Diagnosis not present

## 2019-10-31 ENCOUNTER — Inpatient Hospital Stay (HOSPITAL_COMMUNITY)
Admission: EM | Admit: 2019-10-31 | Discharge: 2019-11-04 | DRG: 291 | Disposition: A | Discharge status: Home / Self Care | Payer: PPO | Attending: Internal Medicine | Admitting: Internal Medicine

## 2019-10-31 ENCOUNTER — Encounter (HOSPITAL_COMMUNITY): Payer: Self-pay | Admitting: Emergency Medicine

## 2019-10-31 ENCOUNTER — Emergency Department (HOSPITAL_COMMUNITY): Payer: PPO

## 2019-10-31 ENCOUNTER — Other Ambulatory Visit: Payer: Self-pay

## 2019-10-31 DIAGNOSIS — E1122 Type 2 diabetes mellitus with diabetic chronic kidney disease: Secondary | ICD-10-CM | POA: Diagnosis present

## 2019-10-31 DIAGNOSIS — N1831 Chronic kidney disease, stage 3a: Secondary | ICD-10-CM | POA: Diagnosis not present

## 2019-10-31 DIAGNOSIS — Z955 Presence of coronary angioplasty implant and graft: Secondary | ICD-10-CM

## 2019-10-31 DIAGNOSIS — Z87820 Personal history of traumatic brain injury: Secondary | ICD-10-CM | POA: Diagnosis not present

## 2019-10-31 DIAGNOSIS — K7460 Unspecified cirrhosis of liver: Secondary | ICD-10-CM | POA: Diagnosis present

## 2019-10-31 DIAGNOSIS — E66811 Obesity, class 1: Secondary | ICD-10-CM | POA: Diagnosis present

## 2019-10-31 DIAGNOSIS — E785 Hyperlipidemia, unspecified: Secondary | ICD-10-CM | POA: Diagnosis not present

## 2019-10-31 DIAGNOSIS — Z6838 Body mass index (BMI) 38.0-38.9, adult: Secondary | ICD-10-CM

## 2019-10-31 DIAGNOSIS — K219 Gastro-esophageal reflux disease without esophagitis: Secondary | ICD-10-CM | POA: Diagnosis present

## 2019-10-31 DIAGNOSIS — E1142 Type 2 diabetes mellitus with diabetic polyneuropathy: Secondary | ICD-10-CM | POA: Diagnosis not present

## 2019-10-31 DIAGNOSIS — D649 Anemia, unspecified: Secondary | ICD-10-CM | POA: Diagnosis not present

## 2019-10-31 DIAGNOSIS — I130 Hypertensive heart and chronic kidney disease with heart failure and stage 1 through stage 4 chronic kidney disease, or unspecified chronic kidney disease: Secondary | ICD-10-CM | POA: Diagnosis not present

## 2019-10-31 DIAGNOSIS — U071 COVID-19: Secondary | ICD-10-CM | POA: Diagnosis present

## 2019-10-31 DIAGNOSIS — Z951 Presence of aortocoronary bypass graft: Secondary | ICD-10-CM | POA: Diagnosis not present

## 2019-10-31 DIAGNOSIS — Z87442 Personal history of urinary calculi: Secondary | ICD-10-CM

## 2019-10-31 DIAGNOSIS — E6609 Other obesity due to excess calories: Secondary | ICD-10-CM | POA: Diagnosis present

## 2019-10-31 DIAGNOSIS — M479 Spondylosis, unspecified: Secondary | ICD-10-CM | POA: Diagnosis not present

## 2019-10-31 DIAGNOSIS — E669 Obesity, unspecified: Secondary | ICD-10-CM | POA: Diagnosis present

## 2019-10-31 DIAGNOSIS — M545 Low back pain: Secondary | ICD-10-CM | POA: Diagnosis present

## 2019-10-31 DIAGNOSIS — I5033 Acute on chronic diastolic (congestive) heart failure: Secondary | ICD-10-CM | POA: Diagnosis not present

## 2019-10-31 DIAGNOSIS — I509 Heart failure, unspecified: Secondary | ICD-10-CM | POA: Diagnosis not present

## 2019-10-31 DIAGNOSIS — D638 Anemia in other chronic diseases classified elsewhere: Secondary | ICD-10-CM | POA: Diagnosis present

## 2019-10-31 DIAGNOSIS — Z87891 Personal history of nicotine dependence: Secondary | ICD-10-CM

## 2019-10-31 DIAGNOSIS — R0902 Hypoxemia: Secondary | ICD-10-CM | POA: Diagnosis present

## 2019-10-31 DIAGNOSIS — I2510 Atherosclerotic heart disease of native coronary artery without angina pectoris: Secondary | ICD-10-CM | POA: Diagnosis not present

## 2019-10-31 DIAGNOSIS — Z8711 Personal history of peptic ulcer disease: Secondary | ICD-10-CM

## 2019-10-31 DIAGNOSIS — Z7984 Long term (current) use of oral hypoglycemic drugs: Secondary | ICD-10-CM

## 2019-10-31 DIAGNOSIS — J45909 Unspecified asthma, uncomplicated: Secondary | ICD-10-CM | POA: Diagnosis not present

## 2019-10-31 DIAGNOSIS — G4733 Obstructive sleep apnea (adult) (pediatric): Secondary | ICD-10-CM | POA: Diagnosis not present

## 2019-10-31 DIAGNOSIS — N182 Chronic kidney disease, stage 2 (mild): Secondary | ICD-10-CM | POA: Diagnosis not present

## 2019-10-31 DIAGNOSIS — I110 Hypertensive heart disease with heart failure: Secondary | ICD-10-CM | POA: Diagnosis not present

## 2019-10-31 DIAGNOSIS — R0789 Other chest pain: Secondary | ICD-10-CM | POA: Diagnosis not present

## 2019-10-31 DIAGNOSIS — D509 Iron deficiency anemia, unspecified: Secondary | ICD-10-CM | POA: Diagnosis not present

## 2019-10-31 DIAGNOSIS — R0602 Shortness of breath: Secondary | ICD-10-CM | POA: Diagnosis not present

## 2019-10-31 DIAGNOSIS — E118 Type 2 diabetes mellitus with unspecified complications: Secondary | ICD-10-CM | POA: Diagnosis not present

## 2019-10-31 DIAGNOSIS — G8929 Other chronic pain: Secondary | ICD-10-CM | POA: Diagnosis present

## 2019-10-31 DIAGNOSIS — Z79899 Other long term (current) drug therapy: Secondary | ICD-10-CM

## 2019-10-31 DIAGNOSIS — Z833 Family history of diabetes mellitus: Secondary | ICD-10-CM

## 2019-10-31 DIAGNOSIS — Z8249 Family history of ischemic heart disease and other diseases of the circulatory system: Secondary | ICD-10-CM

## 2019-10-31 LAB — DIFFERENTIAL
Abs Immature Granulocytes: 0.02 10*3/uL (ref 0.00–0.07)
Basophils Absolute: 0 10*3/uL (ref 0.0–0.1)
Basophils Relative: 0 %
Eosinophils Absolute: 0.2 10*3/uL (ref 0.0–0.5)
Eosinophils Relative: 4 %
Immature Granulocytes: 0 %
Lymphocytes Relative: 17 %
Lymphs Abs: 0.8 10*3/uL (ref 0.7–4.0)
Monocytes Absolute: 0.4 10*3/uL (ref 0.1–1.0)
Monocytes Relative: 8 %
Neutro Abs: 3.4 10*3/uL (ref 1.7–7.7)
Neutrophils Relative %: 71 %

## 2019-10-31 LAB — CBC
HCT: 28.1 % — ABNORMAL LOW (ref 39.0–52.0)
Hemoglobin: 7.8 g/dL — ABNORMAL LOW (ref 13.0–17.0)
MCH: 23.1 pg — ABNORMAL LOW (ref 26.0–34.0)
MCHC: 27.8 g/dL — ABNORMAL LOW (ref 30.0–36.0)
MCV: 83.1 fL (ref 80.0–100.0)
Platelets: 170 10*3/uL (ref 150–400)
RBC: 3.38 MIL/uL — ABNORMAL LOW (ref 4.22–5.81)
RDW: 18.5 % — ABNORMAL HIGH (ref 11.5–15.5)
WBC: 4.7 10*3/uL (ref 4.0–10.5)
nRBC: 0 % (ref 0.0–0.2)

## 2019-10-31 LAB — BASIC METABOLIC PANEL
Anion gap: 9 (ref 5–15)
BUN: 25 mg/dL — ABNORMAL HIGH (ref 8–23)
CO2: 26 mmol/L (ref 22–32)
Calcium: 8.8 mg/dL — ABNORMAL LOW (ref 8.9–10.3)
Chloride: 105 mmol/L (ref 98–111)
Creatinine, Ser: 0.98 mg/dL (ref 0.61–1.24)
GFR calc Af Amer: >60 mL/min (ref >60)
GFR calc non Af Amer: >60 mL/min (ref >60)
Glucose, Bld: 93 mg/dL (ref 70–99)
Potassium: 3.5 mmol/L (ref 3.5–5.1)
Sodium: 140 mmol/L (ref 135–145)

## 2019-10-31 LAB — HEPATIC FUNCTION PANEL
ALT: 17 U/L (ref 0–44)
AST: 24 U/L (ref 15–41)
Albumin: 3.8 g/dL (ref 3.5–5.0)
Alkaline Phosphatase: 106 U/L (ref 38–126)
Bilirubin, Direct: 0.1 mg/dL (ref 0.0–0.2)
Indirect Bilirubin: 0.8 mg/dL (ref 0.3–0.9)
Total Bilirubin: 0.9 mg/dL (ref 0.3–1.2)
Total Protein: 7.5 g/dL (ref 6.5–8.1)

## 2019-10-31 LAB — POC SARS CORONAVIRUS 2 AG: SARS Coronavirus 2 Ag: NEGATIVE

## 2019-10-31 LAB — BRAIN NATRIURETIC PEPTIDE: B Natriuretic Peptide: 181 pg/mL — ABNORMAL HIGH (ref 0.0–100.0)

## 2019-10-31 MED ORDER — SODIUM CHLORIDE 0.9% FLUSH
3.0000 mL | Freq: Once | INTRAVENOUS | Status: AC
  Administered 2019-11-01: 3 mL via INTRAVENOUS

## 2019-10-31 MED ORDER — FUROSEMIDE 10 MG/ML IJ SOLN
40.0000 mg | Freq: Once | INTRAMUSCULAR | Status: AC
  Administered 2019-11-01: 40 mg via INTRAVENOUS
  Filled 2019-10-31: qty 4

## 2019-10-31 NOTE — ED Triage Notes (Signed)
Patient is complaining of sob, balance off, and leg swelling. Patient states all started two weeks ago.

## 2019-10-31 NOTE — ED Provider Notes (Addendum)
[facility] DEPT Provider Note   CSN: [id number] Arrival date & time: 10/31/19  2009     History Chief Complaint  Patient presents with  . Shortness of Breath  . Leg Swelling    [name] is a 74 y.o. male with a past medical history of CHF, CAD, upper GI bleed, hepatic cirrhosis, DM 2, who presents today for evaluation of shortness of breath and leg swelling. History obtained from patient, his son who lives with patient, and chart review. Patient reports that over the past 2 to 3 weeks he has had worsening leg swelling and shortness of breath.  He has not had any medication changes including no changes to his lasix dosing.  He denies missing any doses of his medications stating his son manages the medications.  His son reports that today he had an episode where he got light headed standing in the kitchen, however did not fall.  No headache, visual changes, or new weakness.   He denies any recent trauma. He does have a possible Covid positive contact, he and his son disagree on when this was however it was either within the past week or the past 5 weeks. He denies any fevers.  He denies N/V/D.  He denies any abdominal pain.   He has not gotten any of the covid vaccines.   Patient reports that he has been having left anterior shoulder pain which he attributes to his sleep position.      HPI     Past Medical History:  Diagnosis Date  . Arthritis    "joints tighten up sometimes" (03/27/2104)  . Asthma   . CHF (congestive heart failure) ([place])   . Chronic low back pain   . Chronic lower back pain   . Coronary artery disease    a. 05/2012 Cath/PCI: LM 40ost, 50-60d, LAD 99p ruptured plaque (3.0x28 DES), LCX 50p/m, RCA 30-40p, 31m EF 65-70%.  . Family history of adverse reaction to anesthesia    Pt granddaughter had PONV  . GERD (gastroesophageal reflux disease)   . Hepatic cirrhosis ([place]    a. Dx 01/2014 - CT a/p   . History of blood  transfusion    "related to bleeding ulcers"  . History of concussion    1976--  NO RESIDUAL  . History of GI bleed    a. UGIB 07/2012;  b. 01/2014 admission with GIB/FOB stool req 1U prbc's->EGD showed portal gastropathy, barrett's esoph, and chronic active h. pylori gastritis.  .Marland KitchenHistory of gout    2007 &  2008  LEFT LEG-- NO ISSUE SINCE  . Hyperlipidemia   . Iron deficiency anemia   . Kidney stones   . Lipoma   . OA (osteoarthritis of spine)    LOWER BACK--  INTERMITTANT LEFT LEG NUMBNESS  . OSA (obstructive sleep apnea)    PULMOLOGIST-  DR CLANCE--  MODERATE OSA  STARTED CPAP 2012--  BUT CURRENTLY HAS NOT USED PAST 6 MONTHS  . Phimosis    a. s/p circumcision 2015.  . Type 2 diabetes mellitus ([place]   . Unspecified essential hypertension     Patient Active Problem List   Diagnosis Date Noted  . Acute on chronic diastolic heart failure ([place] 11/01/2019  . Volume overload 07/29/2019  . Itching 03/22/2019  . Skin lesion 03/22/2019  . Hyponatremia 12/24/2018  . (HFpEF) heart failure with preserved ejection fraction ([place] 12/24/2018  . Precordial chest pain   . Neck pain 09/12/2018  . CKD (  chronic kidney disease), stage III 12/18/2017  . Symptomatic anemia 12/18/2017  . Peptic ulcer disease 12/18/2017  . GI bleed 09/29/2017  . SOB (shortness of breath) 09/21/2017  . Morbid obesity ([place]) 02/05/2016  . Numerous moles 09/15/2015  . S/P CABG x 3 03/31/2014  . Hepatic cirrhosis ([place]) 02/27/2014  . Acute blood loss anemia 08/13/2012  . Hyperlipidemia with target LDL less than 70 06/29/2012  . Thrombocytopenia ([place]) 06/29/2012  . Coronary artery disease    . OSA (obstructive sleep apnea)   . Type 2 diabetes with complication ([place]) 58/52/7782  . GERD 08/04/2010  . History of gout   . Hypertensive heart disease     Past Surgical History:  Procedure Laterality Date  . ANKLE FRACTURE SURGERY Right 1989   "plate put in"  . APPENDECTOMY  05-16-2004   open  . CIRCUMCISION N/A  09/09/2013   Procedure: CIRCUMCISION ADULT;  Surgeon: Bernestine Amass, MD;  Location: [facility];  Service: Urology;  Laterality: N/A;  . COLECTOMY  05-16-2004  . CORONARY ANGIOPLASTY WITH STENT PLACEMENT  06/28/2012  DR COOPER   PCI W/  X1 DES to [place]. LAD/  LM  40% OSTIAL & 50-60% DISTAL /  50% PROX LCX/  30-40% PROX RCA & 50% MID RCA/   LVEF 65-70%  . CORONARY ARTERY BYPASS GRAFT N/A 03/31/2014   Procedure: CORONARY ARTERY BYPASS GRAFTING (CABG) times 3 using left internal mammary artery and right saphenous vein.;  Surgeon: Melrose Nakayama, MD;  Location: [facility];  Service: Open Heart Surgery;  Laterality: N/A;  . DOBUTAMINE STRESS ECHO  06-08-2012   MODERATE HYPOKINESIS/ ISCHEMIA MID INFERIOR WALL  . ESOPHAGOGASTRODUODENOSCOPY  08/15/2012   Procedure: ESOPHAGOGASTRODUODENOSCOPY (EGD);  Surgeon: Wonda Horner, MD;  Location: [facility] ENDOSCOPY;  Service: Endoscopy;  Laterality: N/A;  . ESOPHAGOGASTRODUODENOSCOPY N/A 02/17/2014   Procedure: ESOPHAGOGASTRODUODENOSCOPY (EGD);  Surgeon: Jeryl Columbia, MD;  Location: [facility] ENDOSCOPY;  Service: Endoscopy;  Laterality: N/A;  . ESOPHAGOGASTRODUODENOSCOPY (EGD) WITH PROPOFOL N/A 09/30/2017   Procedure: ESOPHAGOGASTRODUODENOSCOPY (EGD) WITH PROPOFOL;  Surgeon: Wonda Horner, MD;  Location: [facility] ENDOSCOPY;  Service: Endoscopy;  Laterality: N/A;  . ESOPHAGOGASTRODUODENOSCOPY (EGD) WITH PROPOFOL N/A 12/20/2017   Procedure: ESOPHAGOGASTRODUODENOSCOPY (EGD) WITH PROPOFOL;  Surgeon: Clarene Essex, MD;  Location: [employer];  Service: Endoscopy;  Laterality: N/A;  . HERNIA REPAIR    . INTRAOPERATIVE TRANSESOPHAGEAL ECHOCARDIOGRAM N/A 03/31/2014   Procedure: INTRAOPERATIVE TRANSESOPHAGEAL ECHOCARDIOGRAM;  Surgeon: Melrose Nakayama, MD;  Location: [facility];  Service: Open Heart Surgery;  Laterality: N/A;  . LAPAROSCOPIC UMBILICAL HERNIA REPAIR W/ MESH  06-06-2011  . LEFT HEART CATHETERIZATION WITH CORONARY ANGIOGRAM N/A 03/28/2014   Procedure: LEFT HEART  CATHETERIZATION WITH CORONARY ANGIOGRAM;  Surgeon: Sinclair Grooms, MD;  Location: [facility] CATH LAB;  Service: Cardiovascular;  Laterality: N/A;  . LIPOMA EXCISION Left 08/25/2017   Procedure: EXCISION LIPOMA LEFT POSTERIOR THIGH;  Surgeon: Clovis Riley, MD;  Location: [facility];  Service: General;  Laterality: Left;  . NEPHROLITHOTOMY  1990'S  . OPEN APPENDECTOMY W/ PARTIAL CECECTOMY  05-16-2004  . PERCUTANEOUS CORONARY STENT INTERVENTION (PCI-S) N/A 06/28/2012   Procedure: PERCUTANEOUS CORONARY STENT INTERVENTION (PCI-S);  Surgeon: Sherren Mocha, MD;  Location: [facility] CATH LAB;  Service: Cardiovascular;  Laterality: N/A;       Family History  Problem Relation Age of Onset  . Lung cancer Sister   . Cancer Sister        lung  . Cancer Mother   . Cancer  Father        died in his 7s.  . Cancer Brother        lung  . Coronary artery disease Other   . Diabetes Other   . Colon cancer Other   . Cancer Sister        lung    Social History   Tobacco Use  . Smoking status: Former Smoker    Packs/day: 2.00    Years: 40.00    Pack years: 80.00    Types: Cigarettes    Quit date: 08/29/1996    Years since quitting: 23.1  . Smokeless tobacco: Never Used  Substance Use Topics  . Alcohol use: Yes    Alcohol/week: 0.0 standard drinks    Comment: occasional  . Drug use: No    Home Medications Prior to Admission medications   Medication Sig Start Date End Date Taking? Authorizing Provider  albuterol (VENTOLIN HFA) 108 (90 Base) MCG/ACT inhaler Inhale 2 puffs into the lungs every 6 (six) hours as needed for wheezing or shortness of breath.   Yes [provider]  ferrous sulfate 325 (65 FE) MG EC tablet Take 1 tablet (325 mg total) by mouth daily with breakfast. Patient taking differently: Take 325 mg by mouth daily with supper.  04/20/18  Yes [name], MD  furosemide (LASIX) 80 MG tablet Take 80 mg by mouth daily.   Yes [provider]  gabapentin (NEURONTIN)  100 MG capsule TAKE 1 CAPSULE(100 MG) BY MOUTH TWICE DAILY Patient taking differently: Take 100 mg by mouth 2 (two) times daily.  05/14/19  Yes [name], MD  glimepiride (AMARYL) 2 MG tablet TAKE 1 TABLET(2 MG) BY MOUTH DAILY BEFORE BREAKFAST Patient taking differently: Take 2 mg by mouth daily with breakfast.  10/03/19  Yes [name], MD  metFORMIN (GLUCOPHAGE) 850 MG tablet Take 1 tablet (850 mg total) by mouth 2 (two) times daily with a meal. 05/30/19  Yes [name], MD  nitroGLYCERIN (NITROSTAT) 0.4 MG SL tablet Place 1 tablet (0.4 mg total) under the tongue every 5 (five) minutes as needed for chest pain. 06/08/16  Yes Nahser, Wonda Cheng, MD  umeclidinium-vilanterol (ANORO ELLIPTA) 62.5-25 MCG/INH AEPB Inhale 1 puff into the lungs daily. 02/05/19  Yes [name], MD  blood glucose meter kit and supplies Dispense based on patient and insurance preference. Use up to four times daily as directed. (FOR ICD-10 E10.9, E11.9). 05/02/19   [name], MD  spironolactone (ALDACTONE) 50 MG tablet Take 1 tablet (50 mg total) by mouth daily. Patient not taking: Reported on 10/31/2019 07/16/19   [name], MD    Allergies    Patient has no known allergies.  Review of Systems   Review of Systems  Constitutional: Positive for fatigue. Negative for chills and fever.  HENT: Negative for congestion.   Eyes: Negative for visual disturbance.  Respiratory: Positive for chest tightness and shortness of breath.   Cardiovascular: Positive for chest pain and leg swelling.  Gastrointestinal: Positive for constipation. Negative for abdominal pain, diarrhea, nausea and vomiting.  Genitourinary: Negative for dysuria and hematuria.  Musculoskeletal: Negative for back pain and neck pain.  Skin: Negative for color change and rash.  Neurological: Negative for weakness and headaches.  Psychiatric/Behavioral: Negative for confusion.  All other systems reviewed  and are negative.   Physical Exam Updated Vital Signs BP 140/71 (BP Location: Left Arm)   Pulse 67   Temp 98.9 F (37.2  C) (Oral)   Resp 18   SpO2 98%   Physical Exam Vitals and nursing note reviewed.  Constitutional:      Appearance: He is well-developed.  HENT:     Head: Normocephalic and atraumatic.  Eyes:     Conjunctiva/sclera: Conjunctivae normal.  Cardiovascular:     Rate and Rhythm: Normal rate and regular rhythm.     Heart sounds: No murmur.  Pulmonary:     Effort: Pulmonary effort is normal. No respiratory distress.     Breath sounds: Normal breath sounds. No decreased breath sounds.  Chest:     Chest wall: No tenderness (Unable to recreate the pain with palpation of the left chest or movment of the left arm. ).  Abdominal:     Palpations: Abdomen is soft.     Tenderness: There is no abdominal tenderness.  Musculoskeletal:     Cervical back: Neck supple.     Right lower leg: Edema present.     Left lower leg: Edema present.     Comments: +4 pitting edema to bilateral lower extremities.  Edema is primarily below the knees but tracks on the posterior aspect of the thigh to mid thigh.   Skin:    General: Skin is warm and dry.  Neurological:     General: No focal deficit present.     Mental Status: He is alert.     Motor: No weakness.  Psychiatric:        Mood and Affect: Mood normal.        Behavior: Behavior normal.     ED Results / Procedures / Treatments   Labs (all labs ordered are listed, but only abnormal results are displayed) Labs Reviewed  BASIC METABOLIC PANEL - Abnormal; Notable for the following components:      Result Value   BUN 25 (*)    Calcium 8.8 (*)    All other components within normal limits  CBC - Abnormal; Notable for the following components:   RBC 3.38 (*)    Hemoglobin 7.8 (*)    HCT 28.1 (*)    MCH 23.1 (*)    MCHC 27.8 (*)    RDW 18.5 (*)    All other components within normal limits  BRAIN NATRIURETIC PEPTIDE -  Abnormal; Notable for the following components:   B Natriuretic Peptide 181.0 (*)    All other components within normal limits  RETICULOCYTES - Abnormal; Notable for the following components:   RBC. 3.36 (*)    Immature Retic Fract 24.7 (*)    All other components within normal limits  RESPIRATORY PANEL BY RT PCR (FLU A&B, COVID)  DIFFERENTIAL  HEPATIC FUNCTION PANEL  VITAMIN B12  FOLATE  IRON AND TIBC  FERRITIN  MAGNESIUM  BASIC METABOLIC PANEL  CBC  PROTIME-INR  MAGNESIUM  POC SARS CORONAVIRUS 2 AG -  ED  POC SARS CORONAVIRUS 2 AG  POC OCCULT BLOOD, ED  TYPE AND SCREEN  PREPARE RBC (CROSSMATCH)  TROPONIN I (HIGH SENSITIVITY)  TROPONIN I (HIGH SENSITIVITY)    EKG None  Radiology DG Chest 2 View  Result Date: 10/31/2019 CLINICAL DATA:  74 year old male with shortness of breath and weakness. EXAM: CHEST - 2 VIEW COMPARISON:  Chest radiograph dated 07/29/2019. FINDINGS: There is cardiomegaly with vascular congestion. No focal consolidation, pleural effusion, or pneumothorax. Median sternotomy wires. Atherosclerotic calcification of the aorta. No acute osseous pathology. IMPRESSION: Cardiomegaly with vascular congestion. No focal consolidation. Electronically Signed   By: Laren Everts.D.  On: 10/31/2019 20:52    Procedures .Critical Care Performed by: Lorin Glass, PA-C Authorized by: Lorin Glass, PA-C   Critical care provider statement:    Critical care time (minutes):  45   Critical care was time spent personally by me on the following activities:  Discussions with consultants, evaluation of patient's response to treatment, examination of patient, ordering and performing treatments and interventions, ordering and review of laboratory studies, ordering and review of radiographic studies, pulse oximetry, re-evaluation of patient's condition, obtaining history from patient or surrogate and review of old charts   (including critical care  time)  Medications Ordered in ED Medications  furosemide (LASIX) injection 40 mg (40 mg Intravenous Given 11/01/19 0039)    ED Course  I have reviewed the triage vital signs and the nursing notes.  Pertinent labs & imaging results that were available during my care of the patient were reviewed by me and considered in my medical decision making (see chart for details).  Clinical Course as of Oct 31 140  Thu Oct 31, 2019  2302 Slightly better than baseline  Brain natriuretic peptide(!) [EH]  Fri Nov 01, 2019  0051 Spoke with hospitalist who will admit patient.    [EH]    Clinical Course User Index [EH] Ollen Gross   MDM Rules/Calculators/A&P                     [name] is a 73 year old gentleman presenting today for evaluation of leg swelling and shortness of breath. On exam he has very significant pitting edema to the bilateral lower extremities. Here he is not requiring oxygen and is not notably tachypneic, however he does exhibit increased work of breathing while in the room.  Chest x-ray shows cardiomegaly with vascular congestion.  His BNP is slightly better than his baseline, however based on the significant amount of edema and his reported worsening shortness of breath I suspect he is overall fluid overloaded.  In addition CBC shows he is anemic with a hemoglobin of 7.8.  He has previously had issues with upper GI bleeding and anemia requiring transfusion, however he is above 7 however I suspect that this is contributing to his overall clinical picture .   He is treated with IV lasix while in the ED.  He denies any frank melana, occult blood testing ordered.    Covid antigen test is negative.  Covid panel by PCR is pending. Prior to giving Lasix I reviewed his potassium which is 3.5, and his creatinine 0.98   Note: Portions of this report may have been transcribed using voice recognition software. Every effort was made to ensure accuracy; however,  inadvertent computerized transcription errors may be present  I spoke with Hospitalist who will admit the patient.  Final Clinical Impression(s) / ED Diagnoses Final diagnoses:  Acute on chronic congestive heart failure, unspecified heart failure type ([place])  Anemia, unspecified type    Rx / DC Orders ED Discharge Orders    None       Lorin Glass, [place] 11/01/19 2355  Addendum 11/03/19: Critical care time added.     Lorin Glass, PA-C 11/03/19 1105    Fatima Blank, MD 11/04/19 [phone]

## 2019-11-01 ENCOUNTER — Encounter (HOSPITAL_COMMUNITY): Payer: Self-pay | Admitting: Internal Medicine

## 2019-11-01 DIAGNOSIS — D509 Iron deficiency anemia, unspecified: Secondary | ICD-10-CM | POA: Diagnosis present

## 2019-11-01 DIAGNOSIS — I2510 Atherosclerotic heart disease of native coronary artery without angina pectoris: Secondary | ICD-10-CM | POA: Diagnosis present

## 2019-11-01 DIAGNOSIS — N182 Chronic kidney disease, stage 2 (mild): Secondary | ICD-10-CM | POA: Diagnosis present

## 2019-11-01 DIAGNOSIS — D638 Anemia in other chronic diseases classified elsewhere: Secondary | ICD-10-CM | POA: Diagnosis present

## 2019-11-01 DIAGNOSIS — R0902 Hypoxemia: Secondary | ICD-10-CM | POA: Diagnosis present

## 2019-11-01 DIAGNOSIS — D649 Anemia, unspecified: Secondary | ICD-10-CM | POA: Diagnosis not present

## 2019-11-01 DIAGNOSIS — N1831 Chronic kidney disease, stage 3a: Secondary | ICD-10-CM | POA: Diagnosis not present

## 2019-11-01 DIAGNOSIS — J45909 Unspecified asthma, uncomplicated: Secondary | ICD-10-CM | POA: Diagnosis present

## 2019-11-01 DIAGNOSIS — I5033 Acute on chronic diastolic (congestive) heart failure: Secondary | ICD-10-CM | POA: Diagnosis present

## 2019-11-01 DIAGNOSIS — I509 Heart failure, unspecified: Secondary | ICD-10-CM | POA: Diagnosis not present

## 2019-11-01 DIAGNOSIS — E118 Type 2 diabetes mellitus with unspecified complications: Secondary | ICD-10-CM | POA: Diagnosis not present

## 2019-11-01 DIAGNOSIS — Z955 Presence of coronary angioplasty implant and graft: Secondary | ICD-10-CM | POA: Diagnosis not present

## 2019-11-01 DIAGNOSIS — M545 Low back pain: Secondary | ICD-10-CM | POA: Diagnosis present

## 2019-11-01 DIAGNOSIS — Z6838 Body mass index (BMI) 38.0-38.9, adult: Secondary | ICD-10-CM | POA: Diagnosis not present

## 2019-11-01 DIAGNOSIS — G4733 Obstructive sleep apnea (adult) (pediatric): Secondary | ICD-10-CM | POA: Diagnosis present

## 2019-11-01 DIAGNOSIS — Z87820 Personal history of traumatic brain injury: Secondary | ICD-10-CM | POA: Diagnosis not present

## 2019-11-01 DIAGNOSIS — Z951 Presence of aortocoronary bypass graft: Secondary | ICD-10-CM | POA: Diagnosis not present

## 2019-11-01 DIAGNOSIS — R0602 Shortness of breath: Secondary | ICD-10-CM | POA: Diagnosis present

## 2019-11-01 DIAGNOSIS — E1122 Type 2 diabetes mellitus with diabetic chronic kidney disease: Secondary | ICD-10-CM | POA: Diagnosis present

## 2019-11-01 DIAGNOSIS — K7460 Unspecified cirrhosis of liver: Secondary | ICD-10-CM | POA: Diagnosis present

## 2019-11-01 DIAGNOSIS — E1142 Type 2 diabetes mellitus with diabetic polyneuropathy: Secondary | ICD-10-CM | POA: Diagnosis present

## 2019-11-01 DIAGNOSIS — U071 COVID-19: Secondary | ICD-10-CM | POA: Diagnosis present

## 2019-11-01 DIAGNOSIS — M479 Spondylosis, unspecified: Secondary | ICD-10-CM | POA: Diagnosis present

## 2019-11-01 DIAGNOSIS — E785 Hyperlipidemia, unspecified: Secondary | ICD-10-CM | POA: Diagnosis present

## 2019-11-01 DIAGNOSIS — Z8711 Personal history of peptic ulcer disease: Secondary | ICD-10-CM | POA: Diagnosis not present

## 2019-11-01 DIAGNOSIS — I130 Hypertensive heart and chronic kidney disease with heart failure and stage 1 through stage 4 chronic kidney disease, or unspecified chronic kidney disease: Secondary | ICD-10-CM | POA: Diagnosis present

## 2019-11-01 DIAGNOSIS — K219 Gastro-esophageal reflux disease without esophagitis: Secondary | ICD-10-CM | POA: Diagnosis present

## 2019-11-01 DIAGNOSIS — G8929 Other chronic pain: Secondary | ICD-10-CM | POA: Diagnosis present

## 2019-11-01 LAB — LIPID PANEL
Cholesterol: 150 mg/dL (ref 0–200)
HDL: 38 mg/dL — ABNORMAL LOW (ref >40)
LDL Cholesterol: 98 mg/dL (ref 0–99)
Total CHOL/HDL Ratio: 3.9 RATIO
Triglycerides: 69 mg/dL (ref <150)
VLDL: 14 mg/dL (ref 0–40)

## 2019-11-01 LAB — CBC
HCT: 27.9 % — ABNORMAL LOW (ref 39.0–52.0)
Hemoglobin: 8 g/dL — ABNORMAL LOW (ref 13.0–17.0)
MCH: 23.7 pg — ABNORMAL LOW (ref 26.0–34.0)
MCHC: 28.7 g/dL — ABNORMAL LOW (ref 30.0–36.0)
MCV: 82.8 fL (ref 80.0–100.0)
Platelets: 153 10*3/uL (ref 150–400)
RBC: 3.37 MIL/uL — ABNORMAL LOW (ref 4.22–5.81)
RDW: 18.6 % — ABNORMAL HIGH (ref 11.5–15.5)
WBC: 4.5 10*3/uL (ref 4.0–10.5)
nRBC: 0 % (ref 0.0–0.2)

## 2019-11-01 LAB — BASIC METABOLIC PANEL
Anion gap: 8 (ref 5–15)
BUN: 22 mg/dL (ref 8–23)
CO2: 25 mmol/L (ref 22–32)
Calcium: 8.7 mg/dL — ABNORMAL LOW (ref 8.9–10.3)
Chloride: 105 mmol/L (ref 98–111)
Creatinine, Ser: 0.99 mg/dL (ref 0.61–1.24)
GFR calc Af Amer: >60 mL/min (ref >60)
GFR calc non Af Amer: >60 mL/min (ref >60)
Glucose, Bld: 91 mg/dL (ref 70–99)
Potassium: 3.6 mmol/L (ref 3.5–5.1)
Sodium: 138 mmol/L (ref 135–145)

## 2019-11-01 LAB — FERRITIN: Ferritin: 14 ng/mL — ABNORMAL LOW (ref 24–336)

## 2019-11-01 LAB — PROTIME-INR
INR: 1.2 (ref 0.8–1.2)
Prothrombin Time: 14.7 seconds (ref 11.4–15.2)

## 2019-11-01 LAB — RETICULOCYTES
Immature Retic Fract: 24.7 % — ABNORMAL HIGH (ref 2.3–15.9)
RBC.: 3.36 MIL/uL — ABNORMAL LOW (ref 4.22–5.81)
Retic Count, Absolute: 94.8 10*3/uL (ref 19.0–186.0)
Retic Ct Pct: 2.8 % (ref 0.4–3.1)

## 2019-11-01 LAB — GLUCOSE, CAPILLARY
Glucose-Capillary: 83 mg/dL (ref 70–99)
Glucose-Capillary: 79 mg/dL (ref 70–99)
Glucose-Capillary: 111 mg/dL — ABNORMAL HIGH (ref 70–99)
Glucose-Capillary: 131 mg/dL — ABNORMAL HIGH (ref 70–99)
Glucose-Capillary: 127 mg/dL — ABNORMAL HIGH (ref 70–99)

## 2019-11-01 LAB — HEMOGLOBIN A1C
Hgb A1c MFr Bld: 5 % (ref 4.8–5.6)
Mean Plasma Glucose: 96.8 mg/dL

## 2019-11-01 LAB — HEMOGLOBIN AND HEMATOCRIT, BLOOD
HCT: 29.8 % — ABNORMAL LOW (ref 39.0–52.0)
HCT: 30 % — ABNORMAL LOW (ref 39.0–52.0)
Hemoglobin: 8.3 g/dL — ABNORMAL LOW (ref 13.0–17.0)
Hemoglobin: 8.2 g/dL — ABNORMAL LOW (ref 13.0–17.0)

## 2019-11-01 LAB — IRON AND TIBC
Iron: 40 ug/dL — ABNORMAL LOW (ref 45–182)
Saturation Ratios: 8 % — ABNORMAL LOW (ref 17.9–39.5)
TIBC: 519 ug/dL — ABNORMAL HIGH (ref 250–450)
UIBC: 479 ug/dL

## 2019-11-01 LAB — PREPARE RBC (CROSSMATCH)

## 2019-11-01 LAB — VITAMIN B12: Vitamin B-12: 235 pg/mL (ref 180–914)

## 2019-11-01 LAB — TROPONIN I (HIGH SENSITIVITY): Troponin I (High Sensitivity): 9 ng/L (ref <18)

## 2019-11-01 LAB — RESPIRATORY PANEL BY RT PCR (FLU A&B, COVID)
Influenza A by PCR: NEGATIVE
Influenza B by PCR: NEGATIVE
SARS Coronavirus 2 by RT PCR: POSITIVE — AB

## 2019-11-01 LAB — OCCULT BLOOD X 1 CARD TO LAB, STOOL: Fecal Occult Bld: POSITIVE — AB

## 2019-11-01 LAB — MAGNESIUM
Magnesium: 2.2 mg/dL (ref 1.7–2.4)
Magnesium: 1.9 mg/dL (ref 1.7–2.4)

## 2019-11-01 LAB — FOLATE: Folate: 12.1 ng/mL (ref >5.9)

## 2019-11-01 MED ORDER — FERROUS SULFATE 325 (65 FE) MG PO TABS
325.0000 mg | ORAL_TABLET | Freq: Every day | ORAL | Status: DC
  Administered 2019-11-01 – 2019-11-03 (×3): 325 mg via ORAL
  Filled 2019-11-01 (×3): qty 1

## 2019-11-01 MED ORDER — INSULIN ASPART 100 UNIT/ML ~~LOC~~ SOLN
0.0000 [IU] | Freq: Three times a day (TID) | SUBCUTANEOUS | Status: DC
  Administered 2019-11-01 – 2019-11-03 (×4): 2 [IU] via SUBCUTANEOUS
  Administered 2019-11-04: 3 [IU] via SUBCUTANEOUS
  Filled 2019-11-01: qty 0.15

## 2019-11-01 MED ORDER — ACETAMINOPHEN 650 MG RE SUPP: 650.0000 mg | Freq: Four times a day (QID) | RECTAL | Status: DC | PRN

## 2019-11-01 MED ORDER — SODIUM CHLORIDE 0.9 % IV SOLN
25.0000 mg | Freq: Once | INTRAVENOUS | Status: AC
  Administered 2019-11-01: 25 mg via INTRAVENOUS
  Filled 2019-11-01: qty 2

## 2019-11-01 MED ORDER — FUROSEMIDE 10 MG/ML IJ SOLN
60.0000 mg | Freq: Two times a day (BID) | INTRAMUSCULAR | Status: DC
  Administered 2019-11-01 – 2019-11-04 (×7): 60 mg via INTRAVENOUS
  Filled 2019-11-01 (×7): qty 6

## 2019-11-01 MED ORDER — ALBUTEROL SULFATE (2.5 MG/3ML) 0.083% IN NEBU: 3.0000 mL | INHALATION_SOLUTION | RESPIRATORY_TRACT | Status: DC | PRN

## 2019-11-01 MED ORDER — PNEUMOCOCCAL VAC POLYVALENT 25 MCG/0.5ML IJ INJ
0.5000 mL | INJECTION | INTRAMUSCULAR | Status: DC
  Filled 2019-11-01: qty 0.5

## 2019-11-01 MED ORDER — SODIUM CHLORIDE 0.9% FLUSH
3.0000 mL | Freq: Two times a day (BID) | INTRAVENOUS | Status: DC
  Administered 2019-11-01 – 2019-11-04 (×8): 3 mL via INTRAVENOUS

## 2019-11-01 MED ORDER — UMECLIDINIUM-VILANTEROL 62.5-25 MCG/INH IN AEPB
1.0000 | INHALATION_SPRAY | Freq: Every day | RESPIRATORY_TRACT | Status: DC
  Administered 2019-11-01 – 2019-11-04 (×4): 1 via RESPIRATORY_TRACT
  Filled 2019-11-01: qty 14

## 2019-11-01 MED ORDER — GABAPENTIN 100 MG PO CAPS
100.0000 mg | ORAL_CAPSULE | Freq: Two times a day (BID) | ORAL | Status: DC
  Administered 2019-11-02 – 2019-11-04 (×5): 100 mg via ORAL
  Filled 2019-11-01 (×5): qty 1

## 2019-11-01 MED ORDER — ACETAMINOPHEN 325 MG PO TABS: 650.0000 mg | ORAL_TABLET | Freq: Four times a day (QID) | ORAL | Status: DC | PRN

## 2019-11-01 MED ORDER — POTASSIUM CHLORIDE CRYS ER 20 MEQ PO TBCR
20.0000 meq | EXTENDED_RELEASE_TABLET | Freq: Once | ORAL | Status: AC
  Administered 2019-11-01: 20 meq via ORAL
  Filled 2019-11-01: qty 1

## 2019-11-01 NOTE — Progress Notes (Signed)
[name] is a 74 y.o. male  who was admitted early this a.m. with medical history significant for chronic heart failure with preserved ejection fraction, coronary disease status post drug-eluting stents x3 in 2013 followed by three-vessel CABG in 2015, type 2 diabetes mellitus complicated by peripheral polyneuropathy, chronic iron deficiency anemia with baseline hemoglobin of 8.5-10, who is admitted to [facility] on 10/31/2019 with acute on chronic heart failure with preserved EF after presenting from home to [facility] Emergency Department complaining of shortness of breath.   Of note, patient is COVID-19 screening in the ED returned positive.  Patient states he had improved but not resolved symptoms after having IV diuresis on admission   PE: General: Awake and alert, no acute distress  Heart: S1 and S2 auscultated, no murmurs  Lungs: Clear to auscultation bilaterally, no wheeze    A/P  1. Acute on chronic HFpEF likely secondary to medication and dietary noncompliance a. Continue twice daily IV diuresis b. Strongly encourage medication and dietary compliance 2. Acute on chronic iron deficiency anemia a. Had EGD in 2015 b. Follow-up FOBT, if positive will consult GI c. will need outpatient GI work-up d. Will give test dose of IV iron  3. COVID 19 a. Currently asymptomatic and on room air, will not treat at this time    Harold Hedge, [place] Pager [phone]

## 2019-11-01 NOTE — TOC Initial Note (Signed)
Transition of Care [facility]) - Initial/Assessment Note    Patient Details  Name: [name] MRN: [id number] Date of Birth: 1945-09-28  Transition of Care [facility]) CM/SW Contact:    Dessa Phi, RN Phone Number: 11/01/2019, 1:08 PM  Clinical Narrative: Patient has difficutly w/reading. Patient agreed to [facility] referral for CHF disease mgmnt-phone calls-referral sent.                  Expected Discharge Plan: Home/Self Care Barriers to Discharge: Continued Medical Work up   Patient Goals and CMS Choice Patient states their goals for this hospitalization and ongoing recovery are:: go home      Expected Discharge Plan and Services Expected Discharge Plan: Home/Self Care   Discharge Planning Services: CM Consult   Living arrangements for the past 2 months: Single Family Home                                      Prior Living Arrangements/Services Living arrangements for the past 2 months: Single Family Home Lives with:: Spouse Patient language and need for interpreter reviewed:: Yes Do you feel safe going back to the place where you live?: Yes        Care giver support system in place?: Yes (comment)   Criminal Activity/Legal Involvement Pertinent to Current Situation/Hospitalization: No - Comment as needed  Activities of Daily Living Home Assistive Devices/Equipment: None ADL Screening (condition at time of admission) Patient's cognitive ability adequate to safely complete daily activities?: Yes Is the patient deaf or have difficulty hearing?: No Does the patient have difficulty seeing, even when wearing glasses/contacts?: No Does the patient have difficulty concentrating, remembering, or making decisions?: No Patient able to express need for assistance with ADLs?: No Does the patient have difficulty dressing or bathing?: No Independently performs ADLs?: Yes (appropriate for developmental age) Does the patient have difficulty walking or climbing stairs?: No Weakness  of Legs: None Weakness of Arms/Hands: None  Permission Sought/Granted Permission sought to share information with : Case Manager Permission granted to share information with : Yes, Verbal Permission Granted  Share Information with NAME: Case manager     Permission granted to share info w Relationship: Dail Lerew son [phone]     Emotional Assessment Appearance:: Appears stated age Attitude/Demeanor/Rapport: Gracious Affect (typically observed): Accepting Orientation: : Oriented to Self, Oriented to Place, Oriented to  Time, Oriented to Situation Alcohol / Substance Use: Not Applicable Psych Involvement: No (comment)  Admission diagnosis:  Acute on chronic diastolic heart failure (HCC) [I50.33] Anemia, unspecified type [D64.9] Acute on chronic congestive heart failure, unspecified heart failure type [facility]) [I50.9] Patient Active Problem List   Diagnosis Date Noted  . Acute on chronic diastolic heart failure ([place]) 11/01/2019  . Volume overload 07/29/2019  . Itching 03/22/2019  . Skin lesion 03/22/2019  . Hyponatremia 12/24/2018  . (HFpEF) heart failure with preserved ejection fraction ([place]) 12/24/2018  . Precordial chest pain   . Neck pain 09/12/2018  . CKD (chronic kidney disease), stage III 12/18/2017  . Acute on chronic anemia 12/18/2017  . Peptic ulcer disease 12/18/2017  . GI bleed 09/29/2017  . Shortness of breath 09/21/2017  . Morbid obesity ([place]) 02/05/2016  . Numerous moles 09/15/2015  . S/P CABG x 3 03/31/2014  . Hepatic cirrhosis ([place]) 02/27/2014  . Acute blood loss anemia 08/13/2012  . Hyperlipidemia with target LDL less than 70 06/29/2012  . Thrombocytopenia (  [place]) 06/29/2012  . Coronary artery disease    . OSA (obstructive sleep apnea)   . Type 2 diabetes with complication ([place]) 00/37/0488  . GERD 08/04/2010  . History of gout   . Hypertensive heart disease    PCP:  [name], MD Pharmacy:   [facility] [place] [place] [address] [place] [address] Phone: [phone] Fax: [phone]     Social Determinants of Health ([place]) Interventions    Readmission Risk Interventions Readmission Risk Prevention Plan 08/01/2019  Transportation Screening Complete  PCP or Specialist Appt within 5-7 Days Not Complete  Not Complete comments disposition pending  Home Care Screening Complete  Medication Review (RN CM) Complete  Some recent data might be hidden

## 2019-11-01 NOTE — H&P (Signed)
History and Physical    PLEASE NOTE THAT DRAGON DICTATION SOFTWARE WAS USED IN THE CONSTRUCTION OF THIS NOTE.   [name]:[id number] DOB: Feb 13, 1946 DOA: 10/31/2019  PCP: [name], MD Patient coming from: home   I have personally briefly reviewed patient's old medical records in [place]  Chief Complaint: Shortness of breath  HPI: [name] is a 74 y.o. male with medical history significant for chronic heart failure with preserved ejection fraction, coronary disease status post drug-eluting stents x3 in 2013 followed by three-vessel CABG in 2015, type 2 diabetes mellitus complicated by peripheral polyneuropathy, chronic iron deficiency anemia with baseline hemoglobin of 8.5-10, who is admitted to [facility] on 10/31/2019 with acute on chronic heart failure with preserved EF after presenting from home to [facility] Emergency Department complaining of shortness of breath.   The patient reports 2 to 3 days of progressive shortness of breath associated with new onset orthopnea.  He also reports worsening of peripheral edema in the bilateral lower extremities over the last week.  He notes new onset nonproductive cough over the last 2 to 3 days as well, but denies any associated subjective fever, chills, rigors, or generalized myalgias.  Denies any associated chest pain, palpitations, diaphoresis, nausea, vomiting, dizziness, presyncope, or syncope.  Denies any associated hemoptysis or calf tenderness.  Denies any recent travel or trauma.  Denies any recent headache, neck stiffness, rhinitis, rhinorrhea, sore throat, abdominal pain, diarrhea, or rash.  He is unsure if he has had any recent COVID-19 exposures. denies dysuria, gross hematuria, or change in urinary urgency/frequency.   Most recent echocardiogram was performed on 07/30/2019 and showed LVEF 55 to 60%, no evidence of LVH, but showed evidence of abnormal septal motion.  Patient's history is  also notable for chronic iron deficiency anemia with baseline hemoglobin of 8.5-10.  EGD performed in June 2015 demonstrated evidence of portal gastropathy as well as gastritis.  Subsequently, the patient has required intermittent blood transfusions and is on chronic oral iron supplementation.  He denies any recent melena or hematochezia.  The patient reports good compliance with his home diuretic regimen, which consists of Lasix 80 mg p.o. daily, and denies any recent dose modifications to this loop diuretic.     ED Course:  Vital signs in the ED were notable for the following: Temperature max 98.9; heart rate 67-72; blood pressure 140/71-160 1/73; respiratory rate 16-20; oxygen saturation 97 to 100% on room air.  Labs were notable for the following: CMP notable for the following: Sodium 140, potassium 3.5, bicarbonate 26, BUN 25 compared most recent prior value of 18 when checked on 08/13/2019, creatinine 0.98, which is down slightly from most recent prior value of 1.05 on 08/13/2019, and liver enzymes were found to be within normal limits.  BNP noted to be 181 compared to most recent prior value of 224 on 07/29/2019.  CBC notable for white blood cell count of 4700, hemoglobin 7.8 associated with MCV 83, MCHC 27.8, and an elevated RDW of 18.5.  This compared to most recent prior hemoglobin value of 10 when checked on 08/13/2019.  Additionally, high-sensitivity troponin I found to be 9.  Rapid COVID-19 antigen performed in the ED this evening was found to be negative.  Subsequently, nasopharyngeal COVID-19 PCR was obtained, with result currently pending.  Chest x-ray performed this evening, relative to most recent prior CXR on 07/29/2019, showed interval development of vascular congestion without evidence of infiltrate, effusion, or pneumothorax.  EKG showed sinus rhythm, heart rate 74, nonspecific T wave flattening in aVL, which was unchanged from most recent prior EKG on 07/19/2019, and no evidence  of ST changes, including no evidence of ST elevation.  While in the ED, the following were administered: Lasix 40 mg IV x1.    Review of Systems: As per HPI otherwise 10 point review of systems negative.   Past Medical History:  Diagnosis Date  . Arthritis    "joints tighten up sometimes" (03/27/2104)  . Asthma   . CHF (congestive heart failure) ([place])   . Chronic low back pain   . Chronic lower back pain   . Coronary artery disease    a. 05/2012 Cath/PCI: LM 40ost, 50-60d, LAD 99p ruptured plaque (3.0x28 DES), LCX 50p/m, RCA 30-40p, 57m EF 65-70%.  . Family history of adverse reaction to anesthesia    Pt granddaughter had PONV  . GERD (gastroesophageal reflux disease)   . Hepatic cirrhosis ([place]    a. Dx 01/2014 - CT a/p   . History of blood transfusion    "related to bleeding ulcers"  . History of concussion    1976--  NO RESIDUAL  . History of GI bleed    a. UGIB 07/2012;  b. 01/2014 admission with GIB/FOB stool req 1U prbc's->EGD showed portal gastropathy, barrett's esoph, and chronic active h. pylori gastritis.  .Marland KitchenHistory of gout    2007 &  2008  LEFT LEG-- NO ISSUE SINCE  . Hyperlipidemia   . Iron deficiency anemia   . Kidney stones   . Lipoma   . OA (osteoarthritis of spine)    LOWER BACK--  INTERMITTANT LEFT LEG NUMBNESS  . OSA (obstructive sleep apnea)    PULMOLOGIST-  DR CLANCE--  MODERATE OSA  STARTED CPAP 2012--  BUT CURRENTLY HAS NOT USED PAST 6 MONTHS  . Phimosis    a. s/p circumcision 2015.  . Type 2 diabetes mellitus ([place]   . Unspecified essential hypertension     Past Surgical History:  Procedure Laterality Date  . ANKLE FRACTURE SURGERY Right 1989   "plate put in"  . APPENDECTOMY  05-16-2004   open  . CIRCUMCISION N/A 09/09/2013   Procedure: CIRCUMCISION ADULT;  Surgeon: DBernestine Amass MD;  [facility]  Service: Urology;  Laterality: N/A;  . COLECTOMY  05-16-2004  . CORONARY ANGIOPLASTY WITH STENT PLACEMENT  06/28/2012   DR COOPER   PCI W/  X1 DES to [place] LAD/  LM  40% OSTIAL & 50-60% DISTAL /  50% PROX LCX/  30-40% PROX RCA & 50% MID RCA/   LVEF 65-70%  . CORONARY ARTERY BYPASS GRAFT N/A 03/31/2014   Procedure: CORONARY ARTERY BYPASS GRAFTING (CABG) times 3 using left internal mammary artery and right saphenous vein.;  Surgeon: SMelrose Nakayama MD;  [place] [facility]  Service: Open Heart Surgery;  Laterality: N/A;  . DOBUTAMINE STRESS ECHO  06-08-2012   MODERATE HYPOKINESIS/ ISCHEMIA MID INFERIOR WALL  . ESOPHAGOGASTRODUODENOSCOPY  08/15/2012   Procedure: ESOPHAGOGASTRODUODENOSCOPY (EGD);  Surgeon: SWonda Horner MD;  [facility];  Service: Endoscopy;  Laterality: N/A;  . ESOPHAGOGASTRODUODENOSCOPY N/A 02/17/2014   Procedure: ESOPHAGOGASTRODUODENOSCOPY (EGD);  Surgeon: MJeryl Columbia MD;  [facility];  Service: Endoscopy;  Laterality: N/A;  . ESOPHAGOGASTRODUODENOSCOPY (EGD) WITH PROPOFOL N/A 09/30/2017   Procedure: ESOPHAGOGASTRODUODENOSCOPY (EGD) WITH PROPOFOL;  Surgeon: GWonda Horner MD;  [facility]  Service: Endoscopy;  Laterality: N/A;  . ESOPHAGOGASTRODUODENOSCOPY (EGD) WITH PROPOFOL  N/A 12/20/2017   Procedure: ESOPHAGOGASTRODUODENOSCOPY (EGD) WITH PROPOFOL;  Surgeon: Clarene Essex, MD;  Location: [place];  Service: Endoscopy;  Laterality: N/A;  . HERNIA REPAIR    . INTRAOPERATIVE TRANSESOPHAGEAL ECHOCARDIOGRAM N/A 03/31/2014   Procedure: INTRAOPERATIVE TRANSESOPHAGEAL ECHOCARDIOGRAM;  Surgeon: Melrose Nakayama, MD;  Location: [place];  Service: Open Heart Surgery;  Laterality: N/A;  . LAPAROSCOPIC UMBILICAL HERNIA REPAIR W/ MESH  06-06-2011  . LEFT HEART CATHETERIZATION WITH CORONARY ANGIOGRAM N/A 03/28/2014   Procedure: LEFT HEART CATHETERIZATION WITH CORONARY ANGIOGRAM;  Surgeon: Sinclair Grooms, MD;  Location[facility] C[facility] LAB;  Service: Cardiovascular;  Laterality: N/A;  . LIPOMA EXCISION Left 08/25/2017   Procedure: EXCISION LIPOMA LEFT POSTERIOR THIGH;  Surgeon:  Clovis Riley, MD;  Location: [place];  Service: General;  Laterality: Left;  . NEPHROLITHOTOMY  1990'S  . OPEN APPENDECTOMY W/ PARTIAL CECECTOMY  05-16-2004  . PERCUTANEOUS CORONARY STENT INTERVENTION (PCI-S) N/A 06/28/2012   Procedure: PERCUTANEOUS CORONARY STENT INTERVENTION (PCI-S);  Surgeon: Sherren Mocha, MD;  Location: [facility] CATH LAB;  Service: Cardiovascular;  Laterality: N/A;    Social History:  reports that he quit smoking about 23 years ago. His smoking use included cigarettes. He has a 80.00 pack-year smoking history. He has never used smokeless tobacco. He reports current alcohol use. He reports that he does not use drugs.   No Known Allergies  Family History  Problem Relation Age of Onset  . Lung cancer Sister   . Cancer Sister        lung  . Cancer Mother   . Cancer Father        died in his 71s.  . Cancer Brother        lung  . Coronary artery disease Other   . Diabetes Other   . Colon cancer Other   . Cancer Sister        lung     Prior to Admission medications   Medication Sig Start Date End Date Taking? Authorizing Provider  albuterol (VENTOLIN HFA) 108 (90 Base) MCG/ACT inhaler Inhale 2 puffs into the lungs every 6 (six) hours as needed for wheezing or shortness of breath.   Yes [provider]  ferrous sulfate 325 (65 FE) MG EC tablet Take 1 tablet (325 mg total) by mouth daily with breakfast. Patient taking differently: Take 325 mg by mouth daily with supper.  04/20/18  Yes [name], MD  furosemide (LASIX) 80 MG tablet Take 80 mg by mouth daily.   Yes [provider]  gabapentin (NEURONTIN) 100 MG capsule TAKE 1 CAPSULE(100 MG) BY MOUTH TWICE DAILY Patient taking differently: Take 100 mg by mouth 2 (two) times daily.  05/14/19  Yes [name], MD  glimepiride (AMARYL) 2 MG tablet TAKE 1 TABLET(2 MG) BY MOUTH DAILY BEFORE BREAKFAST Patient taking differently: Take 2 mg by mouth daily with breakfast.  10/03/19  Yes  [name], MD  metFORMIN (GLUCOPHAGE) 850 MG tablet Take 1 tablet (850 mg total) by mouth 2 (two) times daily with a meal. 05/30/19  Yes [name], MD  nitroGLYCERIN (NITROSTAT) 0.4 MG SL tablet Place 1 tablet (0.4 mg total) under the tongue every 5 (five) minutes as needed for chest pain. 06/08/16  Yes Nahser, Wonda Cheng, MD  umeclidinium-vilanterol (ANORO ELLIPTA) 62.5-25 MCG/INH AEPB Inhale 1 puff into the lungs daily. 02/05/19  Yes [name], MD  blood glucose meter kit and supplies Dispense based on patient and insurance preference. Use  up to four times daily as directed. (FOR ICD-10 E10.9, E11.9). 05/02/19   [name], MD  spironolactone (ALDACTONE) 50 MG tablet Take 1 tablet (50 mg total) by mouth daily. Patient not taking: Reported on 10/31/2019 07/16/19   [name], MD     Objective    Physical Exam: Vitals:   10/31/19 2020 10/31/19 2203 11/01/19 0042  BP: (!) 161/73 (!) 154/68 140/71  Pulse: 72 69 67  Resp: '20 16 18  ' Temp: 98.8 F (37.1 C)  98.9 F (37.2 C)  TempSrc: Oral Oral Oral  SpO2: 97% 100% 98%    General: appears to be stated age; alert, oriented Skin: warm, dry, no rash Head:  AT/[place] Eyes:  PEARL b/l, EOMI Mouth:  Oral mucosa membranes appear moist, normal dentition Neck: supple; trachea midline Heart:  RRR; did not appreciate any M/R/G Lungs: Bilateral crackles noted in the absence of any associated wheezing or rhonchi  Abdomen: + BS; soft, ND, NT Vascular: 2+ pedal pulses b/l; 2+ radial pulses b/l Extremities: 1-2+ peripheral edema in the bilateral lower extremities, no muscle wasting Neuro: strength and sensation intact in upper and lower extremities b/l   Labs on Admission: I have personally reviewed following labs and imaging studies  CBC: Recent Labs  Lab 10/31/19 2035 10/31/19 2036  WBC  --  4.7  NEUTROABS 3.4  --   HGB  --  7.8*  HCT  --  28.1*  MCV  --  83.1  PLT  --  761   Basic  Metabolic Panel: Recent Labs  Lab 10/31/19 2036  NA 140  K 3.5  CL 105  CO2 26  GLUCOSE 93  BUN 25*  CREATININE 0.98  CALCIUM 8.8*   GFR: CrCl cannot be calculated (Unknown ideal weight.). Liver Function Tests: Recent Labs  Lab 10/31/19 2036  AST 24  ALT 17  ALKPHOS 106  BILITOT 0.9  PROT 7.5  ALBUMIN 3.8   No results for input(s): LIPASE, AMYLASE in the last 168 hours. No results for input(s): AMMONIA in the last 168 hours. Coagulation Profile: No results for input(s): INR, PROTIME in the last 168 hours. Cardiac Enzymes: No results for input(s): CKTOTAL, CKMB, CKMBINDEX, TROPONINI in the last 168 hours. BNP (last 3 results) No results for input(s): PROBNP in the last 8760 hours. HbA1C: No results for input(s): HGBA1C in the last 72 hours. CBG: No results for input(s): GLUCAP in the last 168 hours. Lipid Profile: No results for input(s): CHOL, HDL, LDLCALC, TRIG, CHOLHDL, LDLDIRECT in the last 72 hours. Thyroid Function Tests: No results for input(s): TSH, T4TOTAL, FREET4, T3FREE, THYROIDAB in the last 72 hours. Anemia Panel: No results for input(s): VITAMINB12, FOLATE, FERRITIN, TIBC, IRON, RETICCTPCT in the last 72 hours. Urine analysis:    Component Value Date/Time   COLORURINE YELLOW 07/29/2019 1849   APPEARANCEUR CLEAR 07/29/2019 1849   LABSPEC 1.010 07/29/2019 1849   PHURINE 6.0 07/29/2019 1849   GLUCOSEU NEGATIVE 07/29/2019 1849   HGBUR NEGATIVE 07/29/2019 1849   BILIRUBINUR NEGATIVE 07/29/2019 [place] 07/29/2019 1849   PROTEINUR NEGATIVE 07/29/2019 1849   UROBILINOGEN 0.2 03/29/2014 1546   NITRITE NEGATIVE 07/29/2019 1849   LEUKOCYTESUR NEGATIVE 07/29/2019 1849    Radiological Exams on Admission: DG Chest 2 View  Result Date: 10/31/2019 CLINICAL DATA:  74 year old male with shortness of breath and weakness. EXAM: CHEST - 2 VIEW COMPARISON:  Chest radiograph dated 07/29/2019. FINDINGS: There is cardiomegaly with vascular  congestion. No focal consolidation, pleural  effusion, or pneumothorax. Median sternotomy wires. Atherosclerotic calcification of the aorta. No acute osseous pathology. IMPRESSION: Cardiomegaly with vascular congestion. No focal consolidation. Electronically Signed   By: Anner Crete M.D.   On: 10/31/2019 20:52     EKG: Independently reviewed, with result as described above.    Assessment/Plan   JEREMIAS BROYHILL is a 74 y.o. male with medical history significant for chronic heart failure with preserved ejection fraction, coronary disease status post drug-eluting stents x3 in 2013 followed by three-vessel CABG in 2015, type 2 diabetes mellitus complicated by peripheral polyneuropathy, chronic iron deficiency anemia with baseline hemoglobin of 8.5-10, who is admitted to [facility] on 10/31/2019 with acute on chronic heart failure with preserved EF after presenting from home to [facility] Emergency Department complaining of shortness of breath.    Principal Problem:   Acute on chronic diastolic heart failure (HCC) Active Problems:   Type 2 diabetes with complication (HCC)   Shortness of breath   Acute on chronic anemia   #) Acute on chronic heart failure with preserved EF: In the context of a documented history of chronic heart failure with preserved EF, with most recent echocardiogram performed in December 2020, with result as described above, the patient presents with 2 to 3 days of progressive shortness of breath associated with new onset orthopnea, nonproductive cough, and worsening of peripheral edema in the bilateral lower extremities, with presenting chest x-ray showing evidence of interval development of vascular congestion.  Etiology for presenting acutely decompensated heart failure is currently not completely clear, including low clinical suspicion for ACS, particularly in the absence of any associated chest pain, while presenting EKG shows no evidence of acute ischemic  changes.  Furthermore, presenting high-sensitivity troponin I found to be nonelevated at 9, which, in the setting of shortness of breath over the last 2 to 3 days, is further reassuring that current presentation does not appear to be associated with ACS. The patient reports outstanding compliance with his home diuretic regimen which consists of Lasix 80 mg p.o. daily.  1 potential contributing factor leading to the patient's acutely decompensated heart failure could be his acute on chronic iron deficiency anemia causing diminished oxygen carrying capacity failure exacerbation.  Consequently, will strive to monitor patient's acute on chronic IDA closely, as further described above.  In the ED this evening, the patient received Lasix 4 mg IV x1, effectively doubling his outpatient dose of Lasix.  will closely monitor ensuing urine output as well as renal function and impact on electrolytes, with current plan for Lasix 60 mg daily, with next dose scheduled to occur at 8 AM on 11/01/2019.  As presenting potassium is 3.5, with the anticipation of ensuing diuresis driving this value lower, I have ordered potassium chloride 20 mEq p.o. x1 now.   Plan: monitor strict I's & O's and daily weights. Monitor on telemetry. Monitor continuous pulse oximetry.  Potassium chloride 10 mEq p.o. x1 now, as above.  Add on serum magnesium level.  Repeat BMP in the morning, including for monitoring of trend of potassium, bicarbonate, and renal function in response to interval diuresis efforts. Will reassess volume status in the morning and clinically correlate to help guide additional diuresis efforts. In the meantime, I have ordered Lasix 60 mg IV twice daily, with next dose to occur 8 AM this morning. Close monitoring of ensuing blood pressure to help dictate need for additional afterload reduction in order to increase cardiac output.  Work-up and management of acute  on chronic iron deficiency anemia, as further described  low.      #) Acute on chronic iron deficiency anemia: In the setting of a documented history of chronic iron deficiency anemia in the setting of 2015 EGD evidence of portal gastropathy as well as gastritis with baseline hemoglobin of 8.5-10.0, presenting hemoglobin is found to be slightly low relative this baseline range, with presenting hemoglobin noted to be 7.8.  It is possible given presentation involving volume overload this value of 7.8 represents a hemodelusional element.  The patient denies any recent melena or hematochezia, and there is no overt evidence of active bleed at this time.  However, given his history of prior gastrointestinal bleed as well as presentation for acute on chronic heart failure, will closely monitor ensuing hemoglobin values.  Vital signs this from the ED previous exam with hemodynamic stability.  Of note, the patient is not on any blood thinners at home.  Plan: I have placed an order with the blood bank to type, screen, and hold 2 units PRBC.  Repeat CBC in the morning.  Check INR.  We will also check iron studies in the form of ferritin, total iron, and TIBC to evaluate if the patient would benefit from IV iron supplementation beyond the daily oral iron supplementation with which the patient reports excellent compliance.  Monitor on telemetry.      #) Type 2 diabetes mellitus: Appears well controlled as an outpatient with most recent hemoglobin A1c noted to be 6.1% when checked on 07/29/2019.  Does not require exogenous insulin as an outpatient.  Rather, the patient is on Metformin as well as glimepiride.  Plan: We will hold home oral hypoglycemic agents during his hospitalization.  I have ordered Accu-Cheks before every meal and at bedtime with associated moderate dose sliding scale insulin.      #) Obstructive sleep apnea: The patient reports suboptimal compliance with his home nocturnal CPAP, which may predispose him to right-sided heart failure in the  setting of associated increased risk for pulmonary hypertension.   Plan: Counseled the patient on the importance of improved compliance with his home CPAP.  I conveyed to the patient that we would be happy to provide him a CPAP machine to be used on a nocturnal basis during his hospitalization if he so chooses.  The patient politely conveyed his understanding of this offer, but prefers to refrain from nocturnal CPAP use at this time.     DVT prophylaxis: SCDs Code Status: Full code Family Communication: none Disposition Plan: Per Rounding Team Consults called: none  Admission status: Inpatient; med telemetry.    PLEASE NOTE THAT DRAGON DICTATION SOFTWARE WAS USED IN THE CONSTRUCTION OF THIS NOTE.   [place] [facility] Pager [phone] From [place].   Otherwise, please contact night-coverage  [email or web address] Password [email or web address]  11/01/2019, 12:54 AM

## 2019-11-01 NOTE — Progress Notes (Signed)
Hemoglobin is 8. Covering provider notified.

## 2019-11-01 NOTE — Progress Notes (Signed)
Pt. Arrived on unit at 0400 with RN via strecher. Pt. Able to ambulate to bed no assist. Two nurse skin assessment completed with Arville Go, RN. Tele placed on pt. Room orientation reviewed with pt. Pt. Provided two urinals for output.  Plan of care reviewed with pt.

## 2019-11-01 NOTE — ED Notes (Signed)
ED TO INPATIENT HANDOFF REPORT  ED Nurse Name and Phone #: jon wled   S Name/Age/Gender [name] 74 y.o. male Room/Bed: WA19/WA19  Code Status   Code Status: Full Code  Home/SNF/Other Home Patient oriented to: self, place, time and situation Is this baseline? Yes   Triage Complete: Triage complete  Chief Complaint Acute on chronic diastolic heart failure ([place]) [I50.33]  Triage Note Patient is complaining of sob, balance off, and leg swelling. Patient states all started two weeks ago.    Allergies No Known Allergies  Level of Care/Admitting Diagnosis ED Disposition    ED Disposition Condition Comment   Admit  Hospital Area: [place] [[id number]]  Level of Care: Telemetry [5]  Admit to tele based on following criteria: Acute CHF  May admit patient to [facility] or [facility] if equivalent level of care is available:: Yes  Covid Evaluation: Asymptomatic Screening Protocol (No Symptoms)  Diagnosis: Acute on chronic diastolic heart failure ([place]) [428.33.ICD-9-CM]  Admitting Physician: Rhetta Mura [[id number]]  Attending Physician: Rhetta Mura [[id number]]  Estimated length of stay: past midnight tomorrow  Certification:: I certify this patient will need inpatient services for at least 2 midnights       B Medical/Surgery History Past Medical History:  Diagnosis Date  . Arthritis    "joints tighten up sometimes" (03/27/2104)  . Asthma   . CHF (congestive heart failure) ([place])   . Chronic low back pain   . Chronic lower back pain   . Coronary artery disease    a. 05/2012 Cath/PCI: LM 40ost, 50-60d, LAD 99p ruptured plaque (3.0x28 DES), LCX 50p/m, RCA 30-40p, 26m, EF 65-70%.  . Family history of adverse reaction to anesthesia    Pt granddaughter had PONV  . GERD (gastroesophageal reflux disease)   . Hepatic cirrhosis ([place])    a. Dx 01/2014 - CT a/p   . History of blood transfusion    "related to bleeding ulcers"  . History of  concussion    1976--  NO RESIDUAL  . History of GI bleed    a. UGIB 07/2012;  b. 01/2014 admission with GIB/FOB stool req 1U prbc's->EGD showed portal gastropathy, barrett's esoph, and chronic active h. pylori gastritis.  Marland Kitchen History of gout    2007 &  2008  LEFT LEG-- NO ISSUE SINCE  . Hyperlipidemia   . Iron deficiency anemia   . Kidney stones   . Lipoma   . OA (osteoarthritis of spine)    LOWER BACK--  INTERMITTANT LEFT LEG NUMBNESS  . OSA (obstructive sleep apnea)    PULMOLOGIST-  DR CLANCE--  MODERATE OSA  STARTED CPAP 2012--  BUT CURRENTLY HAS NOT USED PAST 6 MONTHS  . Phimosis    a. s/p circumcision 2015.  . Type 2 diabetes mellitus ([place])   . Unspecified essential hypertension    Past Surgical History:  Procedure Laterality Date  . ANKLE FRACTURE SURGERY Right 1989   "plate put in"  . APPENDECTOMY  05-16-2004   open  . CIRCUMCISION N/A 09/09/2013   Procedure: CIRCUMCISION ADULT;  Surgeon: Bernestine Amass, MD;  Location: [facility];  Service: Urology;  Laterality: N/A;  . COLECTOMY  05-16-2004  . CORONARY ANGIOPLASTY WITH STENT PLACEMENT  06/28/2012  DR COOPER   PCI W/  X1 DES to [place]. LAD/  LM  40% OSTIAL & 50-60% DISTAL /  50% PROX LCX/  30-40% PROX RCA & 50% MID RCA/   LVEF 65-70%  .  CORONARY ARTERY BYPASS GRAFT N/A 03/31/2014   Procedure: CORONARY ARTERY BYPASS GRAFTING (CABG) times 3 using left internal mammary artery and right saphenous vein.;  Surgeon: Melrose Nakayama, MD;  Location: [place];  Service: Open Heart Surgery;  Laterality: N/A;  . DOBUTAMINE STRESS ECHO  06-08-2012   MODERATE HYPOKINESIS/ ISCHEMIA MID INFERIOR WALL  . ESOPHAGOGASTRODUODENOSCOPY  08/15/2012   Procedure: ESOPHAGOGASTRODUODENOSCOPY (EGD);  Surgeon: Wonda Horner, MD;  Location: [facility] ENDOSCOPY;  Service: Endoscopy;  Laterality: N/A;  . ESOPHAGOGASTRODUODENOSCOPY N/A 02/17/2014   Procedure: ESOPHAGOGASTRODUODENOSCOPY (EGD);  Surgeon: Jeryl Columbia, MD;  Location: [facility] ENDOSCOPY;   Service: Endoscopy;  Laterality: N/A;  . ESOPHAGOGASTRODUODENOSCOPY (EGD) WITH PROPOFOL N/A 09/30/2017   Procedure: ESOPHAGOGASTRODUODENOSCOPY (EGD) WITH PROPOFOL;  Surgeon: Wonda Horner, MD;  Location: [facility] ENDOSCOPY;  Service: Endoscopy;  Laterality: N/A;  . ESOPHAGOGASTRODUODENOSCOPY (EGD) WITH PROPOFOL N/A 12/20/2017   Procedure: ESOPHAGOGASTRODUODENOSCOPY (EGD) WITH PROPOFOL;  Surgeon: Clarene Essex, MD;  Location: [place];  Service: Endoscopy;  Laterality: N/A;  . HERNIA REPAIR    . INTRAOPERATIVE TRANSESOPHAGEAL ECHOCARDIOGRAM N/A 03/31/2014   Procedure: INTRAOPERATIVE TRANSESOPHAGEAL ECHOCARDIOGRAM;  Surgeon: Melrose Nakayama, MD;  Location: [place];  Service: Open Heart Surgery;  Laterality: N/A;  . LAPAROSCOPIC UMBILICAL HERNIA REPAIR W/ MESH  06-06-2011  . LEFT HEART CATHETERIZATION WITH CORONARY ANGIOGRAM N/A 03/28/2014   Procedure: LEFT HEART CATHETERIZATION WITH CORONARY ANGIOGRAM;  Surgeon: Sinclair Grooms, MD;  Location: [facility] CATH LAB;  Service: Cardiovascular;  Laterality: N/A;  . LIPOMA EXCISION Left 08/25/2017   Procedure: EXCISION LIPOMA LEFT POSTERIOR THIGH;  Surgeon: Clovis Riley, MD;  Location: [place];  Service: General;  Laterality: Left;  . NEPHROLITHOTOMY  1990'S  . OPEN APPENDECTOMY W/ PARTIAL CECECTOMY  05-16-2004  . PERCUTANEOUS CORONARY STENT INTERVENTION (PCI-S) N/A 06/28/2012   Procedure: PERCUTANEOUS CORONARY STENT INTERVENTION (PCI-S);  Surgeon: Sherren Mocha, MD;  Location: [facility] CATH LAB;  Service: Cardiovascular;  Laterality: N/A;     A IV Location/Drains/Wounds Patient Lines/Drains/Airways Status   Active Line/Drains/Airways    Name:   Placement date:   Placement time:   Site:   Days:   Peripheral IV 11/01/19 Left Arm   11/01/19    0034    Arm   less than 1          Intake/Output Last 24 hours No intake or output data in the 24 hours ending 11/01/19 0324  Labs/Imaging Results for orders placed or performed during the hospital encounter of  10/31/19 (from the past 48 hour(s))  Brain natriuretic peptide     Status: Abnormal   Collection Time: 10/31/19  8:35 PM  Result Value Ref Range   B Natriuretic Peptide 181.0 (H) 0.0 - 100.0 pg/mL    Comment: Performed at [facility], [place] [address] [place], [place] [address]  Differential     Status: None   Collection Time: 10/31/19  8:35 PM  Result Value Ref Range   Neutrophils Relative % 71 %   Neutro Abs 3.4 1.7 - 7.7 K/uL   Lymphocytes Relative 17 %   Lymphs Abs 0.8 0.7 - 4.0 K/uL   Monocytes Relative 8 %   Monocytes Absolute 0.4 0.1 - 1.0 K/uL   Eosinophils Relative 4 %   Eosinophils Absolute 0.2 0.0 - 0.5 K/uL   Basophils Relative 0 %   Basophils Absolute 0.0 0.0 - 0.1 K/uL   Immature Granulocytes 0 %   Abs Immature Granulocytes 0.02 0.00 - 0.07 K/uL  Comment: Performed at [facility], [place] [address], [place], [place] [address]  Basic metabolic panel     Status: Abnormal   Collection Time: 10/31/19  8:36 PM  Result Value Ref Range   Sodium 140 135 - 145 mmol/L   Potassium 3.5 3.5 - 5.1 mmol/L   Chloride 105 98 - 111 mmol/L   CO2 26 22 - 32 mmol/L   Glucose, Bld 93 70 - 99 mg/dL    Comment: Glucose reference range applies only to samples taken after fasting for at least 8 hours.   BUN 25 (H) 8 - 23 mg/dL   Creatinine, Ser 0.98 0.61 - 1.24 mg/dL   Calcium 8.8 (L) 8.9 - 10.3 mg/dL   GFR calc non Af Amer >60 >60 mL/min   GFR calc Af Amer >60 >60 mL/min   Anion gap 9 5 - 15    Comment: Performed at [facility], [place] [address] [place], [place] [address]  CBC     Status: Abnormal   Collection Time: 10/31/19  8:36 PM  Result Value Ref Range   WBC 4.7 4.0 - 10.5 K/uL   RBC 3.38 (L) 4.22 - 5.81 MIL/uL   Hemoglobin 7.8 (L) 13.0 - 17.0 g/dL   HCT 28.1 (L) 39.0 - 52.0 %   MCV 83.1 80.0 - 100.0 fL   MCH 23.1 (L) 26.0 - 34.0 pg   MCHC 27.8 (L) 30.0 - 36.0 g/dL   RDW 18.5 (H) 11.5 - 15.5 %   Platelets 170 150 -  400 K/uL   nRBC 0.0 0.0 - 0.2 %    Comment: Performed at [facility], [place] [address], [place], [place] [address]  Hepatic function panel     Status: None   Collection Time: 10/31/19  8:36 PM  Result Value Ref Range   Total Protein 7.5 6.5 - 8.1 g/dL   Albumin 3.8 3.5 - 5.0 g/dL   AST 24 15 - 41 U/L   ALT 17 0 - 44 U/L   Alkaline Phosphatase 106 38 - 126 U/L   Total Bilirubin 0.9 0.3 - 1.2 mg/dL   Bilirubin, Direct 0.1 0.0 - 0.2 mg/dL   Indirect Bilirubin 0.8 0.3 - 0.9 mg/dL    Comment: Performed at [facility], [place] [address] [place], [place] [address]  POC SARS Coronavirus 2 Ag     Status: None   Collection Time: 10/31/19  9:50 PM  Result Value Ref Range   SARS Coronavirus 2 Ag NEGATIVE NEGATIVE    Comment: (NOTE) SARS-CoV-2 antigen NOT DETECTED.  Negative results are presumptive.  Negative results do not preclude SARS-CoV-2 infection and should not be used as the sole basis for treatment or other patient management decisions, including infection  control decisions, particularly in the presence of clinical signs and  symptoms consistent with COVID-19, or in those who have been in contact with the virus.  Negative results must be combined with clinical observations, patient history, and epidemiological information. The expected result is Negative. Fact Sheet for Patients: PodPark.tn Fact Sheet for Healthcare Providers: GiftContent.is This test is not yet approved or cleared by the [place] FDA and  has been authorized for detection and/or diagnosis of SARS-CoV-2 by FDA under an Emergency Use Authorization (EUA).  This EUA will remain in effect (meaning this test can be used) for the duration of  the COVID-19 de claration under Section 564(b)(1) of the Act, 21 U.S.C. section 360bbb-3(b)(1), unless the authorization is  terminated or revoked sooner.   Troponin I (High  Sensitivity)     Status: None   Collection Time: 11/01/19 12:30 AM  Result Value Ref Range   Troponin I (High Sensitivity) 9 <18 ng/L    Comment: (NOTE) Elevated high sensitivity troponin I (hsTnI) values and significant  changes across serial measurements may suggest ACS but many other  chronic and acute conditions are known to elevate hsTnI results.  Refer to the "Links" section for chest pain algorithms and additional  guidance. Performed at [facility], [place] [address] [place], [place] [address]   Type and screen [facility]     Status: None (Preliminary result)   Collection Time: 11/01/19 12:30 AM  Result Value Ref Range   ABO/RH(D) O POS    Antibody Screen NEG    Sample Expiration 11/04/2019,2359    Unit Number [id number]    Blood Component Type RED CELLS,LR    Unit division 00    Status of Unit ALLOCATED    Transfusion Status OK TO TRANSFUSE    Crossmatch Result      Compatible Performed at [facility], [place] [address], [place], [place] [address]    Unit Number [id number]    Blood Component Type RED CELLS,LR    Unit division 00    Status of Unit ALLOCATED    Transfusion Status OK TO TRANSFUSE    Crossmatch Result Compatible   Vitamin B12     Status: None   Collection Time: 11/01/19 12:30 AM  Result Value Ref Range   Vitamin B-12 235 180 - 914 pg/mL    Comment: (NOTE) This assay is not validated for testing neonatal or myeloproliferative syndrome specimens for Vitamin B12 levels. Performed at [facility], [place] [address], [place], [place] [address]   Folate     Status: None   Collection Time: 11/01/19 12:30 AM  Result Value Ref Range   Folate 12.1 >5.9 ng/mL    Comment: Performed at [facility], [place] [address], [place], [place] [address]  Iron and TIBC     Status: Abnormal   Collection Time: 11/01/19 12:30 AM  Result Value Ref Range   Iron 40 (L) 45 - 182  ug/dL   TIBC 519 (H) 250 - 450 ug/dL   Saturation Ratios 8 (L) 17.9 - 39.5 %   UIBC 479 ug/dL    Comment: Performed at [facility], [place] [address], [place], [place] [address]  Ferritin     Status: Abnormal   Collection Time: 11/01/19 12:30 AM  Result Value Ref Range   Ferritin 14 (L) 24 - 336 ng/mL    Comment: Performed at [facility], [place] [address] [place], [place] [address]  Reticulocytes     Status: Abnormal   Collection Time: 11/01/19 12:30 AM  Result Value Ref Range   Retic Ct Pct 2.8 0.4 - 3.1 %   RBC. 3.36 (L) 4.22 - 5.81 MIL/uL   Retic Count, Absolute 94.8 19.0 - 186.0 K/uL   Immature Retic Fract 24.7 (H) 2.3 - 15.9 %    Comment: Performed at [facility], [place] [address], [place], [place] [address]  Magnesium     Status: None   Collection Time: 11/01/19 12:30 AM  Result Value Ref Range   Magnesium 2.2 1.7 - 2.4 mg/dL    Comment: Performed at [facility], [place] [address], [place], [place] [address]  Prepare RBC  Status: None   Collection Time: 11/01/19  1:40 AM  Result Value Ref Range   Order Confirmation      ORDER PROCESSED BY BLOOD BANK Performed at [facility], [place] [address], [place], [place] [address]    DG Chest 2 View  Result Date: 10/31/2019 CLINICAL DATA:  74 year old male with shortness of breath and weakness. EXAM: CHEST - 2 VIEW COMPARISON:  Chest radiograph dated 07/29/2019. FINDINGS: There is cardiomegaly with vascular congestion. No focal consolidation, pleural effusion, or pneumothorax. Median sternotomy wires. Atherosclerotic calcification of the aorta. No acute osseous pathology. IMPRESSION: Cardiomegaly with vascular congestion. No focal consolidation. Electronically Signed   By: Anner Crete M.D.   On: 10/31/2019 20:52    Pending Labs Unresulted Labs (From admission, onward)    Start     Ordered   11/01/19 0500  Magnesium  Tomorrow morning,   R      11/01/19 0134   11/01/19 2778  Basic metabolic panel  Tomorrow morning,   R     11/01/19 0134   11/01/19 0500  CBC  Tomorrow morning,   R     11/01/19 0134   11/01/19 0500  Protime-INR  Tomorrow morning,   R     11/01/19 0134   10/31/19 2102  Respiratory Panel by RT PCR (Flu A&B, Covid) - Nasopharyngeal Swab  (Tier 2 Respiratory Panel by RT PCR (Flu A&B, Covid) (TAT 2 hrs))  Once,   STAT    Question Answer Comment  Is this test for diagnosis or screening Diagnosis of ill patient   Symptomatic for COVID-19 as defined by CDC Yes   Date of Symptom Onset 10/17/2019   Hospitalized for COVID-19 No   Admitted to ICU for COVID-19 No   Previously tested for COVID-19 Yes   Resident in a congregate (group) care setting No   Employed in healthcare setting No      10/31/19 2101          Vitals/Pain Today's Vitals   10/31/19 2030 10/31/19 2203 11/01/19 0042 11/01/19 0321  BP:  (!) 154/68 140/71 140/70  Pulse:  69 67 68  Resp:  16 18 18   Temp:   98.9 F (37.2 C) 98.7 F (37.1 C)  TempSrc:  Oral Oral Oral  SpO2:  100% 98% 99%  PainSc: 0-No pain  0-No pain 0-No pain    Isolation Precautions No active isolations  Medications Medications  ferrous sulfate tablet 325 mg (has no administration in time range)  gabapentin (NEURONTIN) capsule 100 mg (has no administration in time range)  umeclidinium-vilanterol (ANORO ELLIPTA) 62.5-25 MCG/INH 1 puff (has no administration in time range)  sodium chloride flush (NS) 0.9 % injection 3 mL (3 mLs Intravenous Given 11/01/19 0209)  acetaminophen (TYLENOL) tablet 650 mg (has no administration in time range)    Or  acetaminophen (TYLENOL) suppository 650 mg (has no administration in time range)  albuterol (VENTOLIN HFA) 108 (90 Base) MCG/ACT inhaler 1-2 puff (has no administration in time range)  furosemide (LASIX) injection 60 mg (has no administration in time range)  insulin aspart (novoLOG) injection 0-15 Units (has no administration in time  range)  sodium chloride flush (NS) 0.9 % injection 3 mL (3 mLs Intravenous Given 11/01/19 0039)  furosemide (LASIX) injection 40 mg (40 mg Intravenous Given 11/01/19 0039)  potassium chloride SA (KLOR-CON) CR tablet 20 mEq (20 mEq Oral Given 11/01/19 0226)    Mobility walks with device Moderate fall risk   Focused Assessments Pulmonary  Assessment Handoff:  Lung sounds:   O2 Device: Room Air        R Recommendations: See Admitting Provider Note  Report given to:   Additional Notes:

## 2019-11-01 NOTE — Progress Notes (Signed)
Spoke with covering provider to verify that blood is on hold until morning labs are in.

## 2019-11-02 DIAGNOSIS — N1831 Chronic kidney disease, stage 3a: Secondary | ICD-10-CM

## 2019-11-02 DIAGNOSIS — Z951 Presence of aortocoronary bypass graft: Secondary | ICD-10-CM

## 2019-11-02 DIAGNOSIS — E118 Type 2 diabetes mellitus with unspecified complications: Secondary | ICD-10-CM

## 2019-11-02 DIAGNOSIS — D649 Anemia, unspecified: Secondary | ICD-10-CM

## 2019-11-02 DIAGNOSIS — K7460 Unspecified cirrhosis of liver: Secondary | ICD-10-CM

## 2019-11-02 LAB — BASIC METABOLIC PANEL
Anion gap: 9 (ref 5–15)
BUN: 23 mg/dL (ref 8–23)
CO2: 25 mmol/L (ref 22–32)
Calcium: 8.5 mg/dL — ABNORMAL LOW (ref 8.9–10.3)
Chloride: 102 mmol/L (ref 98–111)
Creatinine, Ser: 1.19 mg/dL (ref 0.61–1.24)
GFR calc Af Amer: >60 mL/min (ref >60)
GFR calc non Af Amer: >60 mL/min (ref >60)
Glucose, Bld: 116 mg/dL — ABNORMAL HIGH (ref 70–99)
Potassium: 3.7 mmol/L (ref 3.5–5.1)
Sodium: 136 mmol/L (ref 135–145)

## 2019-11-02 LAB — GLUCOSE, CAPILLARY
Glucose-Capillary: 110 mg/dL — ABNORMAL HIGH (ref 70–99)
Glucose-Capillary: 131 mg/dL — ABNORMAL HIGH (ref 70–99)
Glucose-Capillary: 117 mg/dL — ABNORMAL HIGH (ref 70–99)
Glucose-Capillary: 130 mg/dL — ABNORMAL HIGH (ref 70–99)

## 2019-11-02 LAB — CBC
HCT: 28.8 % — ABNORMAL LOW (ref 39.0–52.0)
Hemoglobin: 8.1 g/dL — ABNORMAL LOW (ref 13.0–17.0)
MCH: 23.2 pg — ABNORMAL LOW (ref 26.0–34.0)
MCHC: 28.1 g/dL — ABNORMAL LOW (ref 30.0–36.0)
MCV: 82.5 fL (ref 80.0–100.0)
Platelets: 165 10*3/uL (ref 150–400)
RBC: 3.49 MIL/uL — ABNORMAL LOW (ref 4.22–5.81)
RDW: 18.2 % — ABNORMAL HIGH (ref 11.5–15.5)
WBC: 4.8 10*3/uL (ref 4.0–10.5)
nRBC: 0 % (ref 0.0–0.2)

## 2019-11-02 MED ORDER — POTASSIUM CHLORIDE 20 MEQ PO PACK
40.0000 meq | PACK | Freq: Once | ORAL | Status: AC
  Administered 2019-11-02: 40 meq via ORAL
  Filled 2019-11-02: qty 2

## 2019-11-02 MED ORDER — DIPHENHYDRAMINE HCL 25 MG PO CAPS
25.0000 mg | ORAL_CAPSULE | Freq: Three times a day (TID) | ORAL | Status: DC | PRN
  Administered 2019-11-02: 25 mg via ORAL
  Filled 2019-11-02: qty 1

## 2019-11-02 NOTE — ED Provider Notes (Signed)
Attestation: Medical screening examination/treatment/procedure(s) were conducted as a shared visit with non-physician practitioner(s) and myself.  I personally evaluated the patient during the encounter. Briefly, the patient is a 74 y.o. male with h/o history of CHF who presents with several weeks of gradually worsening peripheral edema and shortness of breath.  Get rid of  Vitals:   11/01/19 2052 11/02/19 0421  BP: (!) 145/58 (!) 143/69  Pulse: 66 66  Resp:    Temp: 98.2 F (36.8 C) 98 F (36.7 C)  SpO2: 99% 95%    CONSTITUTIONAL: well-appearing, NAD NEURO:  Alert and oriented x 3, no focal deficits EYES:  pupils equal and reactive ENT/NECK:  trachea midline, no JVD CARDIO:  reg rate, reg rhythm, well-perfused PULM:  mildly labored breathing GI/GU:  Abdomin non-distended MSK/SPINE:  No gross deformities, 3+ pitting edema SKIN:  no rash,  PSYCH:  Appropriate speech and behavior  Work up consistent with CHF exacerbation. Given IVF lasix and admitted for further management     EKG: EKG Interpretation  Date/Time:  Thursday October 31 2019 20:18:56 EST Ventricular Rate:  74 PR Interval:    QRS Duration: 104 QT Interval:  415 QTC Calculation: 461 R Axis:   107 Text Interpretation: Sinus rhythm Right axis deviation Confirmed by Thayer Jew [phone]) on 11/01/2019 8:05:40 AM  CRITICAL CARE Performed by: Grayce Sessions Cardama Total critical care time: 10 minutes Critical care time was exclusive of separately billable procedures and treating other patients. Critical care was necessary to treat or prevent imminent or life-threatening deterioration. Critical care was time spent personally by me on the following activities: development of treatment plan with patient and/or surrogate as well as nursing, discussions with consultants, evaluation of patient's response to treatment, examination of patient, obtaining history from patient or surrogate, ordering and performing treatments and  interventions, ordering and review of laboratory studies, ordering and review of radiographic studies, pulse oximetry and re-evaluation of patient's condition.             Fatima Blank, MD 11/04/19 [phone]

## 2019-11-02 NOTE — Progress Notes (Signed)
[facility]  PROGRESS NOTE  [name] GQQ:[id number] DOB: [birth year] DOA: 10/31/2019 PCP: [name], MD Admit date - 10/31/2019   Admitting Physician Rhetta Mura, DO  Outpatient Primary MD for the patient is [name], MD  LOS - 1 Brief Narrative   [name] is a 74 y.o. year old male with medical history significant for chronic diastolic heart failure, CAD status post DES x3 in 2013/CABG (2015), type 2 diabetes with peripheral neuropathy, chronic iron deficiency anemia, cirrhosis, asthma who presented on 10/31/2019 with 2-3 days of progressive dyspnea, new onset orthopnea, nonproductive cough and worsening lower leg swellingand was found to have acute on chronic diastolic CHF exacerbation.  In the ED, he was afebrile, with BP of 161/73. Labs notable for ferritin 14, iron 40, Rapid Covid-19 antigen was negative, PCR positive, BUN 25, hgb 7.8 ( baseline 9-10). BNP 181 CXR with cardiomegaly with vascular congestion with no focal consolidation.  Patient was admitted with working diagnosis of acute on chronic Diastolic CHF exacerbation, given IV lasix and TRH called for admission and management.    Subjective  Today feels breathing is slightly improving from yesterday. No chest pain. Slightly productive cough present  A & P  Acute on chronic diastolic chf exacerbation (TTE, 12/20: EF 55 to 60%), secondary to dietary indiscretions Patient admits to several weeks of worsening lower leg swelling, days of shortness of breath, despite taking home Lasix (80 mg daily), admits to higher salt intake related to fast food.  Still volume overloaded with 2+ pitting edema bilateral lower extremities will benefit from continued IV diuresis -Continue IV Lasix 60 mg twice daily, daily weights, monitor ins and outs  COVID-19 infection, asymptomatic Initial screening rapid negative, PCR positive Patient hypoxic, no infiltrates on chest x-ray  Type 2 diabetes, A1c  5 Glucose well controlled -Monitor CBGs, sliding scale as needed, continue home Neurontin -Holding home Amaryl, Metformin  CKD, stage III Creatinine stable at baseline -Monitor BMP, avoid nephrotoxins  Chronic microcytic anemia, secondary to iron deficiency anemia and anemia of chronic disease No signs or symptoms of current bleeding, stable at baseline of 8-10, slightly decreased on admission 7.8, currently stable at 8.1.  Prior history of GI bleeds (duodenal ulcer/angiectasia of the stomach) -Continue home iron -Daily CBC  Morbid Obesity BMI 38.7 CKD, T2DM,  -Nutrition consulted  Hepatic cirrhosis without ascites Clinically compensated -Currently holding spironolactone, while on IV Lasix  CAD  Status post CABG in 2015.  Asymptomatic care -Continue closely monitor  Asthma, stable No wheezing -Continue home Anoro Ellipta, albuterol as needed  Family Communication  :  No family at bedside  Code Status :  FULL CODE  Disposition Plan  :  Patient is from home. Anticipated d/c date: 2 to 3 days. Barriers to d/c or necessity for inpatient status: when able to transition from IV lasix to oral regimen that controls symptoms Consults  :  none  Procedures  :  none  DVT Prophylaxis  :   SCDs   Lab Results  Component Value Date   PLT 165 11/02/2019    Diet :  Diet Order            Diet heart healthy/carb modified Room service appropriate? Yes; Fluid consistency: Thin  Diet effective now               Inpatient Medications Scheduled Meds: . ferrous sulfate  325 mg Oral Q supper  . furosemide  60 mg Intravenous BID  .  gabapentin  100 mg Oral BID  . insulin aspart  0-15 Units Subcutaneous TID WC  . pneumococcal 23 valent vaccine  0.5 mL Intramuscular Tomorrow-1000  . sodium chloride flush  3 mL Intravenous Q12H  . umeclidinium-vilanterol  1 puff Inhalation Daily   Continuous Infusions: PRN Meds:.acetaminophen **OR** acetaminophen, albuterol,  diphenhydrAMINE  Antibiotics  :   Anti-infectives (From admission, onward)   None       Objective   Vitals:   11/01/19 1421 11/01/19 2052 11/02/19 0421 11/02/19 0500  BP:  (!) 145/58 (!) 143/69   Pulse:  66 66   Resp:      Temp:  98.2 F (36.8 C) 98 F (36.7 C)   TempSrc:  Oral Oral   SpO2:  99% 95%   Weight:    112.2 kg  Height: 5\' 7"  (1.702 m)       SpO2: 95 %  Wt Readings from Last 3 Encounters:  11/02/19 112.2 kg  08/13/19 108 kg  08/07/19 104.7 kg     Intake/Output Summary (Last 24 hours) at 11/02/2019 1122 Last data filed at 11/02/2019 1114 Gross per 24 hour  Intake --  Output 3180 ml  Net -3180 ml    Physical Exam:     Awake Alert, Oriented X 3, Normal affect No new F.N deficits,  [place].AT, Normal respiratory effort on room air, diminished breath sounds throughout difficult to appreciate due to body habitus RRR,No Gallops,Rubs or new Murmurs, 2+ pitting edema b/l lower extremities to calves, no appreciable JVD +ve B.Sounds, Abd Soft, No tenderness, No rebound, guarding or rigidity. No Cyanosis, No new Rash or bruise     I have personally reviewed the following:   Data Reviewed:  CBC Recent Labs  Lab 10/31/19 2035 10/31/19 2036 11/01/19 0603 11/01/19 1717 11/01/19 2252 11/02/19 0334  WBC  --  4.7 4.5  --   --  4.8  HGB  --  7.8* 8.0* 8.3* 8.2* 8.1*  HCT  --  28.1* 27.9* 29.8* 30.0* 28.8*  PLT  --  170 153  --   --  165  MCV  --  83.1 82.8  --   --  82.5  MCH  --  23.1* 23.7*  --   --  23.2*  MCHC  --  27.8* 28.7*  --   --  28.1*  RDW  --  18.5* 18.6*  --   --  18.2*  LYMPHSABS 0.8  --   --   --   --   --   MONOABS 0.4  --   --   --   --   --   EOSABS 0.2  --   --   --   --   --   BASOSABS 0.0  --   --   --   --   --     Chemistries  Recent Labs  Lab 10/31/19 2036 11/01/19 0030 11/01/19 0603 11/02/19 0334  NA 140  --  138 136  K 3.5  --  3.6 3.7  CL 105  --  105 102  CO2 26  --  25 25  GLUCOSE 93  --  91 116*  BUN 25*  --   22 23  CREATININE 0.98  --  0.99 1.19  CALCIUM 8.8*  --  8.7* 8.5*  MG  --  2.2 1.9  --   AST 24  --   --   --   ALT 17  --   --   --  ALKPHOS 106  --   --   --   BILITOT 0.9  --   --   --    ------------------------------------------------------------------------------------------------------------------ Recent Labs    11/01/19 1030  CHOL 150  HDL 38*  LDLCALC 98  TRIG 69  CHOLHDL 3.9    Lab Results  Component Value Date   HGBA1C 5.0 11/01/2019   ------------------------------------------------------------------------------------------------------------------ No results for input(s): TSH, T4TOTAL, T3FREE, THYROIDAB in the last 72 hours.  Invalid input(s): FREET3 ------------------------------------------------------------------------------------------------------------------ Recent Labs    11/01/19 0030  VITAMINB12 235  FOLATE 12.1  FERRITIN 14*  TIBC 519*  IRON 40*  RETICCTPCT 2.8    Coagulation profile Recent Labs  Lab 11/01/19 0603  INR 1.2    No results for input(s): DDIMER in the last 72 hours.  Cardiac Enzymes No results for input(s): CKMB, TROPONINI, MYOGLOBIN in the last 168 hours.  Invalid input(s): CK ------------------------------------------------------------------------------------------------------------------    Component Value Date/Time   BNP 181.0 (H) 10/31/2019 2035    Micro Results Recent Results (from the past 240 hour(s))  Respiratory Panel by RT PCR (Flu A&B, Covid) - Nasopharyngeal Swab     Status: Abnormal   Collection Time: 10/31/19  9:05 PM   Specimen: Nasopharyngeal Swab  Result Value Ref Range Status   SARS Coronavirus 2 by RT PCR POSITIVE (A) NEGATIVE Final    Comment: CRITICAL RESULT CALLED TO, READ BACK BY AND VERIFIED WITH: HALL, J. @ [id number] 11/01/2019 PERRY, J. (NOTE) SARS-CoV-2 target nucleic acids are DETECTED. SARS-CoV-2 RNA is generally detectable in upper respiratory specimens  during the acute phase of  infection. Positive results are indicative of the presence of the identified virus, but do not rule out bacterial infection or co-infection with other pathogens not detected by the test. Clinical correlation with patient history and other diagnostic information is necessary to determine patient infection status. The expected result is Negative. Fact Sheet for Patients:  PinkCheek.be Fact Sheet for Healthcare Providers: GravelBags.it This test is not yet approved or cleared by the [place] FDA and  has been authorized for detection and/or diagnosis of SARS-CoV-2 by FDA under an Emergency Use Authorization (EUA).  This EUA will remain in effect (meaning this test can be  used) for the duration of  the COVID-19 declaration under Section 564(b)(1) of the Act, 21 U.S.C. section 360bbb-3(b)(1), unless the authorization is terminated or revoked sooner.    Influenza A by PCR NEGATIVE NEGATIVE Final   Influenza B by PCR NEGATIVE NEGATIVE Final    Comment: (NOTE) The Xpert Xpress SARS-CoV-2/FLU/RSV assay is intended as an aid in  the diagnosis of influenza from Nasopharyngeal swab specimens and  should not be used as a sole basis for treatment. Nasal washings and  aspirates are unacceptable for Xpert Xpress SARS-CoV-2/FLU/RSV  testing. Fact Sheet for Patients: PinkCheek.be Fact Sheet for Healthcare Providers: GravelBags.it This test is not yet approved or cleared by the [place] FDA and  has been authorized for detection and/or diagnosis of SARS-CoV-2 by  FDA under an Emergency Use Authorization (EUA). This EUA will remain  in effect (meaning this test can be used) for the duration of the  Covid-19 declaration under Section 564(b)(1) of the Act, 21  U.S.C. section 360bbb-3(b)(1), unless the authorization is  terminated or revoked. Performed at [facility], [place] [address], [place], [place] [address]     Radiology Reports DG Chest 2 View  Result Date: 10/31/2019 CLINICAL DATA:  74 year old male with shortness of breath and weakness. EXAM:  CHEST - 2 VIEW COMPARISON:  Chest radiograph dated 07/29/2019. FINDINGS: There is cardiomegaly with vascular congestion. No focal consolidation, pleural effusion, or pneumothorax. Median sternotomy wires. Atherosclerotic calcification of the aorta. No acute osseous pathology. IMPRESSION: Cardiomegaly with vascular congestion. No focal consolidation. Electronically Signed   By: Anner Crete M.D.   On: 10/31/2019 20:52     Time Spent in minutes  30     Desiree Hane M.D on 11/02/2019 at 11:22 AM  To page go to [email or web address] - password [facility]

## 2019-11-03 DIAGNOSIS — R0602 Shortness of breath: Secondary | ICD-10-CM

## 2019-11-03 DIAGNOSIS — I509 Heart failure, unspecified: Secondary | ICD-10-CM

## 2019-11-03 LAB — BASIC METABOLIC PANEL
Anion gap: 7 (ref 5–15)
BUN: 24 mg/dL — ABNORMAL HIGH (ref 8–23)
CO2: 27 mmol/L (ref 22–32)
Calcium: 8.8 mg/dL — ABNORMAL LOW (ref 8.9–10.3)
Chloride: 102 mmol/L (ref 98–111)
Creatinine, Ser: 0.98 mg/dL (ref 0.61–1.24)
GFR calc Af Amer: >60 mL/min (ref >60)
GFR calc non Af Amer: >60 mL/min (ref >60)
Glucose, Bld: 101 mg/dL — ABNORMAL HIGH (ref 70–99)
Potassium: 3.7 mmol/L (ref 3.5–5.1)
Sodium: 136 mmol/L (ref 135–145)

## 2019-11-03 LAB — GLUCOSE, CAPILLARY
Glucose-Capillary: 92 mg/dL (ref 70–99)
Glucose-Capillary: 137 mg/dL — ABNORMAL HIGH (ref 70–99)
Glucose-Capillary: 134 mg/dL — ABNORMAL HIGH (ref 70–99)
Glucose-Capillary: 142 mg/dL — ABNORMAL HIGH (ref 70–99)

## 2019-11-03 MED ORDER — POTASSIUM CHLORIDE 20 MEQ PO PACK
40.0000 meq | PACK | Freq: Once | ORAL | Status: AC
  Administered 2019-11-03: 40 meq via ORAL
  Filled 2019-11-03: qty 2

## 2019-11-03 NOTE — Progress Notes (Signed)
[facility]  PROGRESS NOTE  [name] GLO:[id number] DOB: 01-19-1946 DOA: 10/31/2019 PCP: [name], MD Admit date - 10/31/2019   Admitting Physician Rhetta Mura, DO  Outpatient Primary MD for the patient is [name], MD  LOS - 2 Brief Narrative   [name] is a 74 y.o. year old male with medical history significant for chronic diastolic heart failure, CAD status post DES x3 in 2013/CABG (2015), type 2 diabetes with peripheral neuropathy, chronic iron deficiency anemia, cirrhosis, asthma who presented on 10/31/2019 with 2-3 days of progressive dyspnea, new onset orthopnea, nonproductive cough and worsening lower leg swellingand was found to have acute on chronic diastolic CHF exacerbation.  In the ED, he was afebrile, with BP of 161/73. Labs notable for ferritin 14, iron 40, Rapid Covid-19 antigen was negative, PCR positive, BUN 25, hgb 7.8 ( baseline 9-10). BNP 181 CXR with cardiomegaly with vascular congestion with no focal consolidation.  Patient was admitted with working diagnosis of acute on chronic Diastolic CHF exacerbation, given IV lasix and TRH called for admission and management.    Subjective  Today reports breathing fine. Still having lots of pain in both legs. Reports still much more swollen than normal for him  A & P  Acute on chronic diastolic chf exacerbation (TTE, 12/20: EF 55 to 60%), secondary to dietary indiscretions Patient admits to several weeks of worsening lower leg swelling, days of shortness of breath, despite taking home Lasix (80 mg daily), admits to higher salt intake related to fast food.  Still volume overloaded with 2+ pitting edema bilateral lower extremities will benefit from continued IV diuresis. Put out 3 L with IV lasix in last 24 hours -Continue IV Lasix 60 mg twice daily, daily weights, monitor ins and outs --daily BMP, Mg  COVID-19 infection, asymptomatic Initial screening rapid negative, PCR  positive Patient hypoxic, no infiltrates on chest x-ray  Type 2 diabetes, A1c 5 Glucose well controlled -Monitor CBGs, sliding scale as needed, continue home Neurontin -Holding home Amaryl, Metformin  CKD, stage III Creatinine stable at baseline -Monitor BMP, avoid nephrotoxins  Chronic microcytic anemia, secondary to iron deficiency anemia and anemia of chronic disease No signs or symptoms of current bleeding, stable at baseline of 8-10, slightly decreased on admission 7.8, currently stable at 8.1.  Prior history of GI bleeds (duodenal ulcer/angiectasia of the stomach) -Continue home iron -Daily CBC  Morbid Obesity BMI 38.7 CKD, T2DM,  -Nutrition consulted  Hepatic cirrhosis without ascites Clinically compensated -Currently holding spironolactone, while on IV Lasix  CAD  Status post CABG in 2015.  Asymptomatic care -Continue closely monitor  Asthma, stable No wheezing -Continue home Anoro Ellipta, albuterol as needed  Family Communication  :  No family at bedside  Code Status :  FULL CODE  Disposition Plan  :  Patient is from home. Anticipated d/c date: 24-48 hours. Barriers to d/c or necessity for inpatient status: when able to transition from IV lasix to oral regimen that controls symptoms Consults  :  none  Procedures  :  none  DVT Prophylaxis  :   SCDs   Lab Results  Component Value Date   PLT 165 11/02/2019    Diet :  Diet Order            Diet heart healthy/carb modified Room service appropriate? Yes; Fluid consistency: Thin  Diet effective now               Inpatient Medications Scheduled Meds: .  ferrous sulfate  325 mg Oral Q supper  . furosemide  60 mg Intravenous BID  . gabapentin  100 mg Oral BID  . insulin aspart  0-15 Units Subcutaneous TID WC  . pneumococcal 23 valent vaccine  0.5 mL Intramuscular Tomorrow-1000  . sodium chloride flush  3 mL Intravenous Q12H  . umeclidinium-vilanterol  1 puff Inhalation Daily   Continuous  Infusions: PRN Meds:.acetaminophen **OR** acetaminophen, albuterol, diphenhydrAMINE  Antibiotics  :   Anti-infectives (From admission, onward)   None       Objective   Vitals:   11/02/19 1222 11/02/19 2101 11/03/19 0434 11/03/19 0500  BP: (!) 161/61 132/61 124/64   Pulse: 69 67 65   Resp: 17 20 18    Temp: 98.3 F (36.8 C) 98.1 F (36.7 C) 97.6 F (36.4 C)   TempSrc: Oral Oral Oral   SpO2: 95% 95% 95%   Weight:    111 kg  Height:        SpO2: 95 %  Wt Readings from Last 3 Encounters:  11/03/19 111 kg  08/13/19 108 kg  08/07/19 104.7 kg     Intake/Output Summary (Last 24 hours) at 11/03/2019 1127 Last data filed at 11/03/2019 0845 Gross per 24 hour  Intake 480 ml  Output 3175 ml  Net -2695 ml    Physical Exam:  Awake Alert, Oriented X 3, Normal affect No new F.N deficits,  [place].AT, Normal respiratory effort on room air, diminished breath sounds throughout difficult to appreciate due to body habitus RRR,No Gallops,Rubs or new Murmurs, 2+ pitting edema b/l lower extremities to calves, no appreciable JVD +ve B.Sounds, Abd Soft, No tenderness, No rebound, guarding or rigidity. No Cyanosis, No new Rash or bruise     I have personally reviewed the following:   Data Reviewed:  CBC Recent Labs  Lab 10/31/19 2035 10/31/19 2036 11/01/19 0603 11/01/19 1717 11/01/19 2252 11/02/19 0334  WBC  --  4.7 4.5  --   --  4.8  HGB  --  7.8* 8.0* 8.3* 8.2* 8.1*  HCT  --  28.1* 27.9* 29.8* 30.0* 28.8*  PLT  --  170 153  --   --  165  MCV  --  83.1 82.8  --   --  82.5  MCH  --  23.1* 23.7*  --   --  23.2*  MCHC  --  27.8* 28.7*  --   --  28.1*  RDW  --  18.5* 18.6*  --   --  18.2*  LYMPHSABS 0.8  --   --   --   --   --   MONOABS 0.4  --   --   --   --   --   EOSABS 0.2  --   --   --   --   --   BASOSABS 0.0  --   --   --   --   --     Chemistries  Recent Labs  Lab 10/31/19 2036 11/01/19 0030 11/01/19 0603 11/02/19 0334 11/03/19 0337  NA 140  --  138 136 136   K 3.5  --  3.6 3.7 3.7  CL 105  --  105 102 102  CO2 26  --  25 25 27   GLUCOSE 93  --  91 116* 101*  BUN 25*  --  22 23 24*  CREATININE 0.98  --  0.99 1.19 0.98  CALCIUM 8.8*  --  8.7* 8.5* 8.8*  MG  --  2.2 1.9  --   --   AST 24  --   --   --   --   ALT 17  --   --   --   --   ALKPHOS 106  --   --   --   --   BILITOT 0.9  --   --   --   --    ------------------------------------------------------------------------------------------------------------------ Recent Labs    11/01/19 1030  CHOL 150  HDL 38*  LDLCALC 98  TRIG 69  CHOLHDL 3.9    Lab Results  Component Value Date   HGBA1C 5.0 11/01/2019   ------------------------------------------------------------------------------------------------------------------ No results for input(s): TSH, T4TOTAL, T3FREE, THYROIDAB in the last 72 hours.  Invalid input(s): FREET3 ------------------------------------------------------------------------------------------------------------------ Recent Labs    11/01/19 0030  VITAMINB12 235  FOLATE 12.1  FERRITIN 14*  TIBC 519*  IRON 40*  RETICCTPCT 2.8    Coagulation profile Recent Labs  Lab 11/01/19 0603  INR 1.2    No results for input(s): DDIMER in the last 72 hours.  Cardiac Enzymes No results for input(s): CKMB, TROPONINI, MYOGLOBIN in the last 168 hours.  Invalid input(s): CK ------------------------------------------------------------------------------------------------------------------    Component Value Date/Time   BNP 181.0 (H) 10/31/2019 2035    Micro Results Recent Results (from the past 240 hour(s))  Respiratory Panel by RT PCR (Flu A&B, Covid) - Nasopharyngeal Swab     Status: Abnormal   Collection Time: 10/31/19  9:05 PM   Specimen: Nasopharyngeal Swab  Result Value Ref Range Status   SARS Coronavirus 2 by RT PCR POSITIVE (A) NEGATIVE Final    Comment: CRITICAL RESULT CALLED TO, READ BACK BY AND VERIFIED WITH: HALL, J. @ [id number] 11/01/2019 PERRY,  J. (NOTE) SARS-CoV-2 target nucleic acids are DETECTED. SARS-CoV-2 RNA is generally detectable in upper respiratory specimens  during the acute phase of infection. Positive results are indicative of the presence of the identified virus, but do not rule out bacterial infection or co-infection with other pathogens not detected by the test. Clinical correlation with patient history and other diagnostic information is necessary to determine patient infection status. The expected result is Negative. Fact Sheet for Patients:  PinkCheek.be Fact Sheet for Healthcare Providers: GravelBags.it This test is not yet approved or cleared by the [place] FDA and  has been authorized for detection and/or diagnosis of SARS-CoV-2 by FDA under an Emergency Use Authorization (EUA).  This EUA will remain in effect (meaning this test can be  used) for the duration of  the COVID-19 declaration under Section 564(b)(1) of the Act, 21 U.S.C. section 360bbb-3(b)(1), unless the authorization is terminated or revoked sooner.    Influenza A by PCR NEGATIVE NEGATIVE Final   Influenza B by PCR NEGATIVE NEGATIVE Final    Comment: (NOTE) The Xpert Xpress SARS-CoV-2/FLU/RSV assay is intended as an aid in  the diagnosis of influenza from Nasopharyngeal swab specimens and  should not be used as a sole basis for treatment. Nasal washings and  aspirates are unacceptable for Xpert Xpress SARS-CoV-2/FLU/RSV  testing. Fact Sheet for Patients: PinkCheek.be Fact Sheet for Healthcare Providers: GravelBags.it This test is not yet approved or cleared by the [place] FDA and  has been authorized for detection and/or diagnosis of SARS-CoV-2 by  FDA under an Emergency Use Authorization (EUA). This EUA will remain  in effect (meaning this test can be used) for the duration of the  Covid-19 declaration under  Section 564(b)(1) of the Act, 21  U.S.C. section 360bbb-3(b)(1), unless the  authorization is  terminated or revoked. Performed at [facility], [place] [address] [place], [place] [address]     Radiology Reports DG Chest 2 View  Result Date: 10/31/2019 CLINICAL DATA:  74 year old male with shortness of breath and weakness. EXAM: CHEST - 2 VIEW COMPARISON:  Chest radiograph dated 07/29/2019. FINDINGS: There is cardiomegaly with vascular congestion. No focal consolidation, pleural effusion, or pneumothorax. Median sternotomy wires. Atherosclerotic calcification of the aorta. No acute osseous pathology. IMPRESSION: Cardiomegaly with vascular congestion. No focal consolidation. Electronically Signed   By: Anner Crete M.D.   On: 10/31/2019 20:52     Time Spent in minutes  30     Desiree Hane M.D on 11/03/2019 at 11:27 AM  To page go to [email or web address] - password [email or web address]

## 2019-11-04 DIAGNOSIS — U071 COVID-19: Secondary | ICD-10-CM | POA: Diagnosis present

## 2019-11-04 LAB — CBC WITH DIFFERENTIAL/PLATELET
Abs Immature Granulocytes: 0.01 10*3/uL (ref 0.00–0.07)
Basophils Absolute: 0 10*3/uL (ref 0.0–0.1)
Basophils Relative: 1 %
Eosinophils Absolute: 0.2 10*3/uL (ref 0.0–0.5)
Eosinophils Relative: 3 %
HCT: 28.5 % — ABNORMAL LOW (ref 39.0–52.0)
Hemoglobin: 7.7 g/dL — ABNORMAL LOW (ref 13.0–17.0)
Immature Granulocytes: 0 %
Lymphocytes Relative: 17 %
Lymphs Abs: 0.8 10*3/uL (ref 0.7–4.0)
MCH: 22.9 pg — ABNORMAL LOW (ref 26.0–34.0)
MCHC: 27 g/dL — ABNORMAL LOW (ref 30.0–36.0)
MCV: 84.8 fL (ref 80.0–100.0)
Monocytes Absolute: 0.4 10*3/uL (ref 0.1–1.0)
Monocytes Relative: 10 %
Neutro Abs: 3 10*3/uL (ref 1.7–7.7)
Neutrophils Relative %: 69 %
Platelets: 140 10*3/uL — ABNORMAL LOW (ref 150–400)
RBC: 3.36 MIL/uL — ABNORMAL LOW (ref 4.22–5.81)
RDW: 18.5 % — ABNORMAL HIGH (ref 11.5–15.5)
WBC: 4.4 10*3/uL (ref 4.0–10.5)
nRBC: 0 % (ref 0.0–0.2)

## 2019-11-04 LAB — BASIC METABOLIC PANEL
Anion gap: 8 (ref 5–15)
BUN: 24 mg/dL — ABNORMAL HIGH (ref 8–23)
CO2: 26 mmol/L (ref 22–32)
Calcium: 8.4 mg/dL — ABNORMAL LOW (ref 8.9–10.3)
Chloride: 103 mmol/L (ref 98–111)
Creatinine, Ser: 1.17 mg/dL (ref 0.61–1.24)
GFR calc Af Amer: >60 mL/min (ref >60)
GFR calc non Af Amer: >60 mL/min (ref >60)
Glucose, Bld: 111 mg/dL — ABNORMAL HIGH (ref 70–99)
Potassium: 3.7 mmol/L (ref 3.5–5.1)
Sodium: 137 mmol/L (ref 135–145)

## 2019-11-04 LAB — GLUCOSE, CAPILLARY
Glucose-Capillary: 108 mg/dL — ABNORMAL HIGH (ref 70–99)
Glucose-Capillary: 153 mg/dL — ABNORMAL HIGH (ref 70–99)

## 2019-11-04 NOTE — Progress Notes (Signed)
Patient was given verbal and written discharge instructions. IV's were removed and belongings are with patient.  He voiced that he is comfortable with being discharged.

## 2019-11-04 NOTE — Discharge Instructions (Signed)
Anemia  Anemia is a condition in which you do not have enough red blood cells or hemoglobin. Hemoglobin is a substance in red blood cells that carries oxygen. When you do not have enough red blood cells or hemoglobin (are anemic), your body cannot get enough oxygen and your organs may not work properly. As a result, you may feel very tired or have other problems. What are the causes? Common causes of anemia include:  Excessive bleeding. Anemia can be caused by excessive bleeding inside or outside the body, including bleeding from the intestine or from periods in women.  Poor nutrition.  Long-lasting (chronic) kidney, thyroid, and liver disease.  Bone marrow disorders.  Cancer and treatments for cancer.  HIV (human immunodeficiency virus) and AIDS (acquired immunodeficiency syndrome).  Treatments for HIV and AIDS.  Spleen problems.  Blood disorders.  Infections, medicines, and autoimmune disorders that destroy red blood cells. What are the signs or symptoms? Symptoms of this condition include:  Minor weakness.  Dizziness.  Headache.  Feeling heartbeats that are irregular or faster than normal (palpitations).  Shortness of breath, especially with exercise.  Paleness.  Cold sensitivity.  Indigestion.  Nausea.  Difficulty sleeping.  Difficulty concentrating. Symptoms may occur suddenly or develop slowly. If your anemia is mild, you may not have symptoms. How is this diagnosed? This condition is diagnosed based on:  Blood tests.  Your medical history.  A physical exam.  Bone marrow biopsy. Your health care provider may also check your stool (feces) for blood and may do additional testing to look for the cause of your bleeding. You may also have other tests, including:  Imaging tests, such as a CT scan or MRI.  Endoscopy.  Colonoscopy. How is this treated? Treatment for this condition depends on the cause. If you continue to lose a lot of blood, you may  need to be treated at a hospital. Treatment may include:  Taking supplements of iron, vitamin M08, or folic acid.  Taking a hormone medicine (erythropoietin) that can help to stimulate red blood cell growth.  Having a blood transfusion. This may be needed if you lose a lot of blood.  Making changes to your diet.  Having surgery to remove your spleen. Follow these instructions at home:  Take over-the-counter and prescription medicines only as told by your health care provider.  Take supplements only as told by your health care provider.  Follow any diet instructions that you were given.  Keep all follow-up visits as told by your health care provider. This is important. Contact a health care provider if:  You develop new bleeding anywhere in the body. Get help right away if:  You are very weak.  You are short of breath.  You have pain in your abdomen or chest.  You are dizzy or feel faint.  You have trouble concentrating.  You have bloody or black, tarry stools.  You vomit repeatedly or you vomit up blood. Summary  Anemia is a condition in which you do not have enough red blood cells or enough of a substance in your red blood cells that carries oxygen (hemoglobin).  Symptoms may occur suddenly or develop slowly.  If your anemia is mild, you may not have symptoms.  This condition is diagnosed with blood tests as well as a medical history and physical exam. Other tests may be needed.  Treatment for this condition depends on the cause of the anemia. This information is not intended to replace advice given to you by  your health care provider. Make sure you discuss any questions you have with your health care provider. Document Revised: 07/28/2017 Document Reviewed: 09/16/2016 Elsevier Patient Education  [place].   Heart Failure, Diagnosis  Heart failure means that your heart is not able to pump blood in the right way. This makes it hard for your body to  work well. Heart failure is usually a long-term (chronic) condition. You must take good care of yourself and follow your treatment plan from your doctor. What are the causes? This condition may be caused by:  High blood pressure.  Build up of cholesterol and fat in the arteries.  Heart attack. This injures the heart muscle.  Heart valves that do not open and close properly.  Damage of the heart muscle. This is also called cardiomyopathy.  Lung disease.  Abnormal heart rhythms. What increases the risk? The risk of heart failure goes up as a person ages. This condition is also more likely to develop in people who:  Are overweight.  Are male.  Smoke or chew tobacco.  Abuse alcohol or illegal drugs.  Have taken medicines that can damage the heart.  Have diabetes.  Have abnormal heart rhythms.  Have thyroid problems.  Have low blood counts (anemia). What are the signs or symptoms? Symptoms of this condition include:  Shortness of breath.  Coughing.  Swelling of the feet, ankles, legs, or belly.  Losing weight for no reason.  Trouble breathing.  Waking from sleep because of the need to sit up and get more air.  Rapid heartbeat.  Being very tired.  Feeling dizzy, or feeling like you may pass out (faint).  Having no desire to eat.  Feeling like you may vomit (nauseous).  Peeing (urinating) more at night.  Feeling confused. How is this treated?     This condition may be treated with:  Medicines. These can be given to treat blood pressure and to make the heart muscles stronger.  Changes in your daily life. These may include eating a healthy diet, staying at a healthy body weight, quitting tobacco and illegal drug use, or doing exercises.  Surgery. Surgery can be done to open blocked valves, or to put devices in the heart, such as pacemakers.  A donor heart (heart transplant). You will receive a healthy heart from a donor. Follow these instructions  at home:  Treat other conditions as told by your doctor. These may include high blood pressure, diabetes, thyroid disease, or abnormal heart rhythms.  Learn as much as you can about heart failure.  Get support as you need it.  Keep all follow-up visits as told by your doctor. This is important. Summary  Heart failure means that your heart is not able to pump blood in the right way.  This condition is caused by high blood pressure, heart attack, or damage of the heart muscle.  Symptoms of this condition include shortness of breath and swelling of the feet, ankles, legs, or belly. You may also feel very tired or feel like you may vomit.  You may be treated with medicines, surgery, or changes in your daily life.  Treat other health conditions as told by your doctor. This information is not intended to replace advice given to you by your health care provider. Make sure you discuss any questions you have with your health care provider. Document Revised: 11/02/2018 Document Reviewed: 11/02/2018 Elsevier Patient Education  [place].   Heart Failure, Self Care Heart failure is a serious  condition. This sheet explains things you need to do to take care of yourself at home. To help you stay as healthy as possible, you may be asked to change your diet, take certain medicines, and make other changes in your life. Your doctor may also give you more specific instructions. If you have problems or questions, call your doctor. What are the risks? Having heart failure makes it more likely for you to have some problems. These problems can get worse if you do not take good care of yourself. Problems may include:  Blood clotting problems. This may cause a stroke.  Damage to the kidneys, liver, or lungs.  Abnormal heart rhythms. Supplies needed:  Scale for weighing yourself.  Blood pressure monitor.  Notebook.  Medicines. How to care for yourself when you have heart  failure Medicines Take over-the-counter and prescription medicines only as told by your doctor. Take your medicines every day.  Do not stop taking your medicine unless your doctor tells you to do so.  Do not skip any medicines.  Get your prescriptions refilled before you run out of medicine. This is important. Eating and drinking   Eat heart-healthy foods. Talk with a diet specialist (dietitian) to create an eating plan.  Choose foods that: ? Have no trans fat. ? Are low in saturated fat and cholesterol.  Choose healthy foods, such as: ? Fresh or frozen fruits and vegetables. ? Fish. ? Low-fat (lean) meats. ? Legumes, such as beans, peas, and lentils. ? Fat-free or low-fat dairy products. ? Whole-grain foods. ? High-fiber foods.  Limit salt (sodium) if told by your doctor. Ask your diet specialist to tell you which seasonings are healthy for your heart.  Cook in healthy ways instead of frying. Healthy ways of cooking include roasting, grilling, broiling, baking, poaching, steaming, and stir-frying.  Limit how much fluid you drink, if told by your doctor. Alcohol use  Do not drink alcohol if: ? Your doctor tells you not to drink. ? Your heart was damaged by alcohol, or you have very bad heart failure. ? You are pregnant, may be pregnant, or are planning to become pregnant.  If you drink alcohol: ? Limit how much you use to:  0-1 drink a day for women.  0-2 drinks a day for men. ? Be aware of how much alcohol is in your drink. In the [place] one drink equals one 12 oz bottle of beer (355 mL), one 5 oz glass of wine (148 mL), or one 1 oz glass of hard liquor (44 mL). Lifestyle   Do not use any products that contain nicotine or tobacco, such as cigarettes, e-cigarettes, and chewing tobacco. If you need help quitting, ask your doctor. ? Do not use nicotine gum or patches before talking to your doctor.  Do not use illegal drugs.  Lose weight if told by your  doctor.  Do physical activity if told by your doctor. Talk to your doctor before you begin an exercise if: ? You are an older adult. ? You have very bad heart failure.  Learn to manage stress. If you need help, ask your doctor.  Get rehab (rehabilitation) to help you stay independent and to help with your quality of life.  Plan time to rest when you get tired. Check weight and blood pressure   Weigh yourself every day. This will help you to know if fluid is building up in your body. ? Weigh yourself every morning after you pee (urinate) and before you eat  breakfast. ? Wear the same amount of clothing each time. ? Write down your daily weight. Give your record to your doctor.  Check and write down your blood pressure as told by your doctor.  Check your pulse as told by your doctor. Dealing with very hot and very cold weather  If it is very hot: ? Avoid activities that take a lot of energy. ? Use air conditioning or fans, or find a cooler place. ? Avoid caffeine and alcohol. ? Wear clothing that is loose-fitting, lightweight, and light-colored.  If it is very cold: ? Avoid activities that take a lot of energy. ? Layer your clothes. ? Wear mittens or gloves, a hat, and a scarf when you go outside. ? Avoid alcohol. Follow these instructions at home:  Stay up to date with shots (vaccines). Get pneumococcal and flu (influenza) shots.  Keep all follow-up visits as told by your doctor. This is important. Contact a doctor if:  You gain weight quickly.  You have increasing shortness of breath.  You cannot do your normal activities.  You get tired easily.  You cough a lot.  You don't feel like eating or feel like you may vomit (nauseous).  You become puffy (swell) in your hands, feet, ankles, or belly (abdomen).  You cannot sleep well because it is hard to breathe.  You feel like your heart is beating fast (palpitations).  You get dizzy when you stand up. Get help  right away if:  You have trouble breathing.  You or someone else notices a change in your behavior, such as having trouble staying awake.  You have chest pain or discomfort.  You pass out (faint). These symptoms may be an emergency. Do not wait to see if the symptoms will go away. Get medical help right away. Call your local emergency services (911 in the [place] Do not drive yourself to the hospital. Summary  Heart failure is a serious condition. To care for yourself, you may have to change your diet, take medicines, and make other lifestyle changes.  Take your medicines every day. Do not stop taking them unless your doctor tells you to do so.  Eat heart-healthy foods, such as fresh or frozen fruits and vegetables, fish, lean meats, legumes, fat-free or low-fat dairy products, and whole-grain or high-fiber foods.  Ask your doctor if you can drink alcohol. You may have to stop alcohol use if you have very bad heart failure.  Contact your doctor if you gain weight quickly or feel that your heart is beating too fast. Get help right away if you pass out, or have chest pain or trouble breathing. This information is not intended to replace advice given to you by your health care provider. Make sure you discuss any questions you have with your health care provider. Document Revised: 11/27/2018 Document Reviewed: 11/28/2018 Elsevier Patient Education  [place].   Aplastic Anemia Aplastic anemia occurs when soft tissue inside of bones (bone marrow) stops making enough blood cells. There are three types of blood cells:  Red blood cells. These carry oxygen to the tissues of the body.  White blood cells. These fight infection.  Platelets. These help your blood to clot when you have an injury. Aplastic anemia is a rare and serious condition that may develop slowly or rapidly. Even after treatment, it is necessary to be monitored for problems that can come back (recur). What are the  causes? This condition may be caused by anything that injures bone  marrow, such as:  Radiation and chemotherapy treatment for cancer. These treatments are used to kill cancer cells, but they also damage healthy cells.  Exposure to poisonous (toxic) chemicals that are used in some pesticides and insecticides.  Certain medicines, such as medicines used to treat rheumatoid arthritis.  Conditions in which the body's disease-fighting system (immune system) attacks the body's own cells (autoimmune disorders).  Viral infections, including hepatitis.  Pregnancy. This is a rare cause of aplastic anemia, and it may be related to an autoimmune problem that affects bone marrow. Sometimes, the cause is not known. What are the signs or symptoms? Symptoms of this condition include:  Shortness of breath.  Fatigue.  Light-headedness or fainting.  Shortness of breath and rapid heart rate, especially with physical activity.  Pale skin and lips.  Frequent infections.  Bruising and bleeding easily.  Nosebleeds and bleeding gums.  Prolonged bleeding from cuts.  Severe bleeding during menstrual periods in women.  Sore mouth.  Bacterial or fungal infections. How is this diagnosed? This condition is diagnosed based on:  Your symptoms.  Your medical history.  Blood tests.  Removal of a bone marrow sample (biopsy) for testing. You may have more tests to find the underlying cause of your aplastic anemia. How is this treated? Treatment for aplastic anemia depends on the severity of the condition. For mild cases, observation is needed. In severe cases, or if complications develop, hospitalization may be necessary. Severe aplastic anemia is life-threatening. Treatment may include:  Receiving donated blood through an IV tube (blood transfusion).  Medicines: ? To reduce the activity of (suppress) the immune system, if you have an autoimmune disorder. ? To stimulate bone marrow to make more  blood cells.  Antibiotic medicines to prevent infection.  Receiving healthy bone marrow from a donor (bone marrow transplant), if your condition is severe. After the transplant, you will need to take medicines to help prevent your body from rejecting the new marrow. If the procedure is successful, the transplanted marrow will begin to produce new blood cells. Your health care provider will determine whether you are a candidate for bone marrow transplant. Follow these instructions at home:  Medicines  Take over-the-counter and prescription medicines only as told by your health care provider.  If you were prescribed an antibiotic medicine, take it as told by your health care provider. Do not stop taking the antibiotic even if you start to feel better. General instructions  Get plenty of rest, and eat a healthy, well-balanced diet.  Avoid excessive exercise. Long-term anemia can put stress on the heart. Ask your health care provider what types of exercise are best for you.  When platelets are at low levels, avoid all activities that put you at risk for injury. This is important because your risk of bleeding is greater. Ask your health care provider what activities are safe for you when your platelets are low.  Protect yourself from infections: ? Wash your hands often with soap and water. If soap and water are not available, use hand sanitizer. ? Avoid crowds. ? Avoid contact with sick people.  Keep all follow-up visits as told by your health care provider. This is important. How is this prevented?  Avoid exposure to toxic chemicals such as: ? Insecticides. ? Herbicides. ? Organic solvents. ? Paint removers.  Take steps to protect yourself from infections. Contact a health care provider if:  You develop mouth sores.  You have a fever or other symptoms that last for more than  2-3 days.  You develop flu-like symptoms.  You are bruising easily.  You develop signs of an  infection.  You have blood in your urine or your stool (feces).  You are bleeding from your gums or nose.  You develop infections more often than usual. Get help right away if:  You have a fever and your symptoms suddenly get worse.  You have prolonged bleeding from cuts.  You have shortness of breath that gets worse.  You have chest pain or a rapid heart rate when you do physical activity.  You have increasing fatigue and tiredness.  You become light-headed or you faint.  You develop pale skin and lips.  You cough up blood. Summary  Aplastic anemia occurs when soft tissue inside of the bones (bone marrow) stops making enough blood cells.  Treatment for aplastic anemia depends on the severity of the condition, and it may involve medicines or transfusions.  In severe cases, or if complications develop, hospitalization may be necessary. This information is not intended to replace advice given to you by your health care provider. Make sure you discuss any questions you have with your health care provider. Document Revised: 07/28/2017 Document Reviewed: 04/11/2016 Elsevier Patient Education  [place].   Heart Failure, Diagnosis  Heart failure is a condition in which the heart has trouble pumping blood because it has become weak or stiff. This means that the heart does not pump blood well enough for the body to stay healthy. For some people with heart failure, fluid may back up into the lungs. There may also be swelling (edema) in the lower legs. Heart failure is usually a long-term (chronic) condition. It is important for you to take good care of yourself and follow the treatment plan from your health care provider. What are the causes? This condition may be caused by:  High blood pressure (hypertension). Hypertension causes the heart muscle to work harder than normal. This makes the heart stiff or weak.  Coronary artery disease, or CAD. CAD is the buildup of  cholesterol and fat (plaque) in the arteries of the heart.  Heart attack, also called myocardial infarction. This injures the heart muscle, making it hard for the heart to pump blood.  Abnormal heart valves. The valves do not open and close properly, forcing the heart to pump harder to keep the blood flowing.  Heart muscle disease (cardiomyopathy or myocarditis). This is damage to the heart muscle. It can increase the risk of heart failure.  Lung disease. The heart works harder when the lungs are not healthy.  Abnormal heart rhythms. These can lead to heart failure. What increases the risk? The risk of heart failure increases as a person ages. This condition is also more likely to develop in people who:  Are overweight.  Are male.  Smoke or chew tobacco.  Abuse alcohol or illegal drugs.  Have taken medicines that can damage the heart, such as chemotherapy drugs.  Have diabetes.  Have abnormal heart rhythms.  Have thyroid problems.  Have low blood counts (anemia). What are the signs or symptoms? Symptoms of this condition include:  Shortness of breath with activity, such as when climbing stairs.  A cough that does not go away.  Swelling of the feet, ankles, legs, or abdomen.  Losing weight for no reason.  Trouble breathing when lying flat (orthopnea).  Waking from sleep because of the need to sit up and get more air.  Rapid heartbeat.  Tiredness (fatigue) and loss of  energy.  Feeling light-headed, dizzy, or close to fainting.  Loss of appetite.  Nausea.  Waking up more often during the night to urinate (nocturia).  Confusion. How is this diagnosed? This condition is diagnosed based on:  Your medical history, symptoms, and a physical exam.  Diagnostic tests, which may include: ? Echocardiogram. ? Electrocardiogram (ECG). ? Chest X-ray. ? Blood tests. ? Exercise stress test. ? Radionuclide scans. ? Cardiac catheterization and angiogram. How is this  treated? Treatment for this condition is aimed at managing the symptoms of heart failure. Medicines Treatment may include medicines that:  Help lower blood pressure by relaxing (dilating) the blood vessels. These medicines are called ACE inhibitors (angiotensin-converting enzyme) and ARBs (angiotensin receptor blockers).  Cause the kidneys to remove salt and water from the blood through urination (diuretics).  Improve heart muscle strength and prevent the heart from beating too fast (beta blockers).  Increase the force of the heartbeat (digoxin). Healthy behavior changes     Treatment may also include making healthy lifestyle changes, such as:  Reaching and staying at a healthy weight.  Quitting smoking or chewing tobacco.  Eating heart-healthy foods.  Limiting or avoiding alcohol.  Stopping the use of illegal drugs.  Being physically active.  Other treatments Other treatments may include:  Procedures to open blocked arteries or repair damaged valves.  Placing a pacemaker to improve heart function (cardiac resynchronization therapy).  Placing a device to treat serious abnormal heart rhythms (implantable cardioverter defibrillator, or ICD).  Placing a device to improve the pumping ability of the heart (left ventricular assist device, or LVAD).  Receiving a healthy heart from a donor (heart transplant). This is done when other treatments have not helped. Follow these instructions at home:  Manage other health conditions as told by your health care provider. These may include hypertension, diabetes, thyroid disease, or abnormal heart rhythms.  Get ongoing education and support as needed. Learn as much as you can about heart failure.  Keep all follow-up visits as told by your health care provider. This is important. Summary  Heart failure is a condition in which the heart has trouble pumping blood because it has become weak or stiff.  This condition is caused by high  blood pressure and other diseases of the heart and lungs.  Symptoms of this condition include shortness of breath, tiredness (fatigue), nausea, and swelling of the feet, ankles, legs, or abdomen.  Treatments for this condition may include medicines, lifestyle changes, and surgery.  Manage other health conditions as told by your health care provider. This information is not intended to replace advice given to you by your health care provider. Make sure you discuss any questions you have with your health care provider. Document Revised: 11/02/2018 Document Reviewed: 11/02/2018 Elsevier Patient Education  [place].   Heart Failure, Self Care Heart failure is a serious condition. This document explains the things you need to do to take care of yourself after a heart failure diagnosis. You may be asked to change your diet, take certain medicines, and make other lifestyle changes in order to stay as healthy as possible. Your health care provider may also give you more specific instructions. If you have problems or questions, contact your health care provider. What are the risks? Having heart failure puts you at higher risk for certain problems. These problems can get worse if you do not take good care of yourself. Problems may include:  Blood clotting problems. This may cause a stroke.  Damage  to the kidneys, liver, or lungs.  Abnormal heart rhythms. Supplies needed:  Scale for monitoring weight.  Blood pressure monitor.  Notebook.  Medicines. How to care for yourself when you have heart failure Medicines Take over-the-counter and prescription medicines only as told by your health care provider. Medicines reduce the workload of your heart, slow the progression of heart failure, and improve symptoms. Take your medicines every day.  Do not stop taking your medicine unless your health care provider tells you to do so.  Do not skip any dose of medicine.  Refill your  prescriptions before you run out of medicine. Eating and drinking   Eat heart-healthy foods. Talk with a dietitian to make an eating plan that is right for you. ? Choose foods that contain no trans fat and are low in saturated fat and cholesterol. Healthy choices include fresh or frozen fruits and vegetables, fish, lean meats, legumes, fat-free or low-fat dairy products, and whole-grain or high-fiber foods. ? Limit salt (sodium) if told by your health care provider. Sodium restriction may reduce symptoms of heart failure. Ask a dietitian to recommend heart-healthy seasonings. ? Use healthy cooking methods instead of frying. Healthy methods include roasting, grilling, broiling, baking, poaching, steaming, and stir-frying.  Limit your fluid intake, if directed by your health care provider. Fluid restriction may reduce symptoms of heart failure. Alcohol use  Do not drink alcohol if: ? Your health care provider tells you not to drink. ? Your heart was damaged by alcohol, or you have severe heart failure. ? You are pregnant, may be pregnant, or are planning to become pregnant.  If you drink alcohol: ? Limit how much you use to:  0-1 drink a day for women.  0-2 drinks a day for men. ? Be aware of how much alcohol is in your drink. In the [place] one drink equals one 12 oz bottle of beer (355 mL), one 5 oz glass of wine (148 mL), or one 1 oz glass of hard liquor (44 mL). Lifestyle   Do not use any products that contain nicotine or tobacco, such as cigarettes, e-cigarettes, and chewing tobacco. If you need help quitting, ask your health care provider. ? Do not use nicotine gum or patches before talking to your health care provider.  Do not use illegal drugs.  Work with your health care provider to safely reach the right body weight.  Do physical activity if told by your health care provider. Talk to your health care provider before you begin an exercise if: ? You are an older adult. ? You  have severe heart failure.  Learn to manage stress. If you need help to do this, ask your health care provider.  Participate in or seek rehabilitation as needed to keep or improve your independence and quality of life.  Plan rest periods when you get tired. Monitoring important information   Weigh yourself every day. This will help you to notice if too much fluid is building up in your body. ? Weigh yourself every morning after you urinate and before you eat breakfast. ? Wear the same amount of clothing each time you weigh yourself. ? Record your daily weight. Provide your health care provider with your weight record.  Monitor and record your pulse and blood pressure as told by your health care provider. Dealing with extreme temperatures  If the weather is extremely hot: ? Avoid vigorous physical activity. ? Use air conditioning or fans, or find a cooler location. ? Avoid caffeine and  alcohol. ? Wear loose-fitting, lightweight, and light-colored clothing.  If the weather is extremely cold: ? Avoid vigorous activity. ? Layer your clothes. ? Wear mittens or gloves, a hat, and a scarf when you go outside. ? Avoid alcohol. Follow these instructions at home:  Stay up to date with vaccines. Pneumococcal and flu (influenza) vaccines are especially important in preventing infections of the airways.  Keep all follow-up visits as told by your health care provider. This is important. Contact a health care provider if you:  Have a rapid weight gain.  Have increasing shortness of breath.  Are unable to participate in your usual physical activities.  Get tired easily.  Cough more than normal, especially with physical activity.  Lose your appetite or feel nauseous.  Have any swelling or more swelling in areas such as your hands, feet, ankles, or abdomen.  Are unable to sleep because it is hard to breathe.  Feel like your heart is beating quickly (palpitations).  Become dizzy or  light-headed when you stand up. Get help right away if you:  Have trouble breathing.  Notice or your family notices a change in your awareness, such as having trouble staying awake or concentrating.  Have pain or discomfort in your chest.  Have an episode of fainting (syncope). These symptoms may represent a serious problem that is an emergency. Do not wait to see if the symptoms will go away. Get medical help right away. Call your local emergency services (911 in the [place] Do not drive yourself to the hospital. Summary  Heart failure is a serious condition. To care for yourself, you may be asked to change your diet, take certain medicines, and make other lifestyle changes.  Take your medicines every day. Do not stop taking them unless your health care provider tells you to do so.  Eat heart-healthy foods, such as fresh or frozen fruits and vegetables, fish, lean meats, legumes, fat-free or low-fat dairy products, and whole-grain or high-fiber foods.  Ask your health care provider if you have any alcohol restrictions. You may have to stop drinking alcohol if you have severe heart failure.  Contact your health care provider if you notice problems, such as rapid weight gain or a fast heartbeat. Get help right away if you faint, or have chest pain or trouble breathing. This information is not intended to replace advice given to you by your health care provider. Make sure you discuss any questions you have with your health care provider. Document Revised: 11/27/2018 Document Reviewed: 11/28/2018 Elsevier Patient Education  [place].

## 2019-11-04 NOTE — Discharge Summary (Signed)
[name]:[id number] DOB: 09/26/1945 DOA: 10/31/2019  PCP: [name], MD  Admit date: 10/31/2019 Discharge date: 11/04/2019  Admitted From: Home Disposition:  Home  Recommendations for Outpatient Follow-up:  1. Follow up with PCP in 1-2 weeks 2. Please obtain BMP/CBC in one week 3. Please follow up on the following pending results:  Home Health:None  Equipment/Devices: None  Discharge Condition: Stable  CODE STATUS:FULL   Brief/Interim Summary: History of present illness:  [name] is a 74 y.o. year old male with medical history significant for chronic diastolic heart failure, CAD status post DES x3 in 2013/CABG (2015), type 2 diabetes with peripheral neuropathy, chronic iron deficiency anemia, cirrhosis, asthma who presented on 10/31/2019 with 2-3 days of progressive dyspnea, new onset orthopnea, nonproductive cough and worsening lower leg swelling and was found to have acute on chronic diastolic CHF exacerbation.  In the ED, he was afebrile, with BP of 161/73. Labs notable for ferritin 14, iron 40, Rapid Covid-19 antigen was negative, PCR positive, BUN 25, hgb 7.8 ( baseline 9-10). BNP 181 CXR with cardiomegaly with vascular congestion with no focal consolidation.  Patient was admitted with working diagnosis of acute on chronic Diastolic CHF exacerbation, given IV lasix and TRH called for admission and management.   Remaining hospital course addressed in problem based format below:   Hospital Course:   Acute on chronic diastolic chf exacerbation (TTE, 12/20: EF 55 to 60%), secondary to dietary indiscretions Patient admits to several weeks of worsening lower leg swelling, days of shortness of breath, despite taking home Lasix (80 mg daily), admits to higher salt intake related to fast food.    With IV Lasix diuresis patient was net -12 L, down 11 pounds this admission, dry weight of 236 pounds (111 kg).  Patient had no shortness of breath, no cough, and no O2  requirements at rest or with exertion on day of discharge. -Emphasized following low-salt diet to patient and son -We will resume home Lasix regimen of 80 mg daily  COVID-19 infection, asymptomatic Initial screening rapid negative, repeat PCR positive Patient not hypoxic, no infiltrates on chest x-ray -Covid precautions emphasized on discharge to patient and family  Type 2 diabetes, A1c 5 Glucose well controlled during hospital stay -Resume home oral hypoglycemic agents  CKD, stage III Creatinine stable at baseline -Monitor BMP, avoid nephrotoxins  Chronic microcytic anemia, secondary to iron deficiency anemia and anemia of chronic disease Hemoglobin baseline range 8-10, 7.8 on admission, remained within those parameters during hospitalization.  No melena, no bleeding.  Patient has a prior history of GI bleeds -Can continue home iron  Morbid Obesity BMI 38.7 CKD, T2DM,   Hepatic cirrhosis without ascites Clinically compensated -Resume home spironolactone and Lasix on discharge  CAD  Status post CABG in 2015.  Asymptomatic here  Asthma, stable No wheezing -Continue home Anoro Ellipta, albuterol as needed  Consultations:  None  Procedures/Studies: none Subjective: Feels well.  No shortness of breath.  No cough.  No chest pain.  Feels lower leg swelling is much improved. Discharge Exam: Vitals:   11/03/19 2115 11/04/19 0429  BP: (!) 145/94 (!) 112/96  Pulse: 72 67  Resp: 20 18  Temp: 98 F (36.7 C) 97.9 F (36.6 C)  SpO2: 96% 92%   Vitals:   11/03/19 1336 11/03/19 2115 11/04/19 0429 11/04/19 0432  BP: 134/68 (!) 145/94 (!) 112/96   Pulse:  72 67   Resp:  20 18   Temp: 97.8 F (36.6 C)  41 F (36.7 C) 97.9 F (36.6 C)   TempSrc: Oral Oral Oral   SpO2: 97% 96% 92%   Weight:    107.4 kg  Height:        General:elderly male, sitting up in bed, no apparent distress Eyes: EOMI ENT: Oral Mucosa clear and moist Cardiovascular: regular rate and  rhythm, no murmurs, rubs or gallops, 1+ pitting edema of bilateral lower extremities to calves Respiratory: Normal respiratory effort on room air, lungs clear to auscultation bilaterally Abdomen: soft, non-distended, non-tender, normal bowel sounds Skin: No Rash Neurologic:Grossly no focal neuro deficit.Mental status AAOx3, speech normal, Psychiatric:Appropriate affect, and mood  Discharge Diagnoses:  Principal Problem:   Acute on chronic diastolic heart failure (HCC) Active Problems:   Type 2 diabetes with complication (HCC)   Hepatic cirrhosis (HCC)   S/P CABG x 3   Morbid obesity (HCC)   Shortness of breath   CKD (chronic kidney disease), stage III   Acute on chronic anemia   COVID-19 virus infection    Discharge Instructions  Discharge Instructions    Diet - low sodium heart healthy   Complete by: As directed    Increase activity slowly   Complete by: As directed      Allergies as of 11/04/2019   No Known Allergies     Medication List    TAKE these medications   albuterol 108 (90 Base) MCG/ACT inhaler Commonly known as: VENTOLIN HFA Inhale 2 puffs into the lungs every 6 (six) hours as needed for wheezing or shortness of breath.   blood glucose meter kit and supplies Dispense based on patient and insurance preference. Use up to four times daily as directed. (FOR ICD-10 E10.9, E11.9).   ferrous sulfate 325 (65 FE) MG EC tablet Take 1 tablet (325 mg total) by mouth daily with breakfast. What changed: when to take this   furosemide 80 MG tablet Commonly known as: LASIX Take 80 mg by mouth daily.   gabapentin 100 MG capsule Commonly known as: NEURONTIN TAKE 1 CAPSULE(100 MG) BY MOUTH TWICE DAILY What changed:   how much to take  how to take this  when to take this  additional instructions   glimepiride 2 MG tablet Commonly known as: AMARYL TAKE 1 TABLET(2 MG) BY MOUTH DAILY BEFORE BREAKFAST What changed: See the new instructions.   metFORMIN 850 MG  tablet Commonly known as: GLUCOPHAGE Take 1 tablet (850 mg total) by mouth 2 (two) times daily with a meal.   nitroGLYCERIN 0.4 MG SL tablet Commonly known as: NITROSTAT Place 1 tablet (0.4 mg total) under the tongue every 5 (five) minutes as needed for chest pain.   spironolactone 50 MG tablet Commonly known as: ALDACTONE Take 1 tablet (50 mg total) by mouth daily.   umeclidinium-vilanterol 62.5-25 MCG/INH Aepb Commonly known as: Anoro Ellipta Inhale 1 puff into the lungs daily.       No Known Allergies      The results of significant diagnostics from this hospitalization (including imaging, microbiology, ancillary and laboratory) are listed below for reference.     Microbiology: Recent Results (from the past 240 hour(s))  Respiratory Panel by RT PCR (Flu A&B, Covid) - Nasopharyngeal Swab     Status: Abnormal   Collection Time: 10/31/19  9:05 PM   Specimen: Nasopharyngeal Swab  Result Value Ref Range Status   SARS Coronavirus 2 by RT PCR POSITIVE (A) NEGATIVE Final    Comment: CRITICAL RESULT CALLED TO, READ BACK BY AND VERIFIED WITH:  HALL, J. @ [phone] 11/01/2019 PERRY, J. (NOTE) SARS-CoV-2 target nucleic acids are DETECTED. SARS-CoV-2 RNA is generally detectable in upper respiratory specimens  during the acute phase of infection. Positive results are indicative of the presence of the identified virus, but do not rule out bacterial infection or co-infection with other pathogens not detected by the test. Clinical correlation with patient history and other diagnostic information is necessary to determine patient infection status. The expected result is Negative. Fact Sheet for Patients:  PinkCheek.be Fact Sheet for Healthcare Providers: GravelBags.it This test is not yet approved or cleared by the [place] FDA and  has been authorized for detection and/or diagnosis of SARS-CoV-2 by FDA under an Emergency Use  Authorization (EUA).  This EUA will remain in effect (meaning this test can be  used) for the duration of  the COVID-19 declaration under Section 564(b)(1) of the Act, 21 U.S.C. section 360bbb-3(b)(1), unless the authorization is terminated or revoked sooner.    Influenza A by PCR NEGATIVE NEGATIVE Final   Influenza B by PCR NEGATIVE NEGATIVE Final    Comment: (NOTE) The Xpert Xpress SARS-CoV-2/FLU/RSV assay is intended as an aid in  the diagnosis of influenza from Nasopharyngeal swab specimens and  should not be used as a sole basis for treatment. Nasal washings and  aspirates are unacceptable for Xpert Xpress SARS-CoV-2/FLU/RSV  testing. Fact Sheet for Patients: PinkCheek.be Fact Sheet for Healthcare Providers: GravelBags.it This test is not yet approved or cleared by the [place] FDA and  has been authorized for detection and/or diagnosis of SARS-CoV-2 by  FDA under an Emergency Use Authorization (EUA). This EUA will remain  in effect (meaning this test can be used) for the duration of the  Covid-19 declaration under Section 564(b)(1) of the Act, 21  U.S.C. section 360bbb-3(b)(1), unless the authorization is  terminated or revoked. Performed at [facility], [place] [address], [place], [place] [address]      Labs: BNP (last 3 results) Recent Labs    07/27/19 1257 07/29/19 2100 10/31/19 2035  BNP 228.3* 223.9* 194.1*   Basic Metabolic Panel: Recent Labs  Lab 10/31/19 2036 11/01/19 0030 11/01/19 0603 11/02/19 0334 11/03/19 0337 11/04/19 0417  NA 140  --  138 136 136 137  K 3.5  --  3.6 3.7 3.7 3.7  CL 105  --  105 102 102 103  CO2 26  --  '25 25 27 26  ' GLUCOSE 93  --  91 116* 101* 111*  BUN 25*  --  22 23 24* 24*  CREATININE 0.98  --  0.99 1.19 0.98 1.17  CALCIUM 8.8*  --  8.7* 8.5* 8.8* 8.4*  MG  --  2.2 1.9  --   --   --    Liver Function Tests: Recent Labs  Lab 10/31/19 2036   AST 24  ALT 17  ALKPHOS 106  BILITOT 0.9  PROT 7.5  ALBUMIN 3.8   No results for input(s): LIPASE, AMYLASE in the last 168 hours. No results for input(s): AMMONIA in the last 168 hours. CBC: Recent Labs  Lab 10/31/19 2035 10/31/19 2036 10/31/19 2036 11/01/19 0603 11/01/19 1717 11/01/19 2252 11/02/19 0334 11/04/19 0417  WBC  --  4.7  --  4.5  --   --  4.8 4.4  NEUTROABS 3.4  --   --   --   --   --   --  3.0  HGB  --  7.8*   < > 8.0* 8.3* 8.2*  8.1* 7.7*  HCT  --  28.1*   < > 27.9* 29.8* 30.0* 28.8* 28.5*  MCV  --  83.1  --  82.8  --   --  82.5 84.8  PLT  --  170  --  153  --   --  165 140*   < > = values in this interval not displayed.   Cardiac Enzymes: No results for input(s): CKTOTAL, CKMB, CKMBINDEX, TROPONINI in the last 168 hours. BNP: Invalid input(s): POCBNP CBG: Recent Labs  Lab 11/03/19 1154 11/03/19 1650 11/03/19 2112 11/04/19 0744 11/04/19 1142  GLUCAP 137* 134* 142* 108* 153*   D-Dimer No results for input(s): DDIMER in the last 72 hours. Hgb A1c No results for input(s): HGBA1C in the last 72 hours. Lipid Profile No results for input(s): CHOL, HDL, LDLCALC, TRIG, CHOLHDL, LDLDIRECT in the last 72 hours. Thyroid function studies No results for input(s): TSH, T4TOTAL, T3FREE, THYROIDAB in the last 72 hours.  Invalid input(s): FREET3 Anemia work up No results for input(s): VITAMINB12, FOLATE, FERRITIN, TIBC, IRON, RETICCTPCT in the last 72 hours. Urinalysis    Component Value Date/Time   COLORURINE YELLOW 07/29/2019 1849   APPEARANCEUR CLEAR 07/29/2019 1849   LABSPEC 1.010 07/29/2019 1849   PHURINE 6.0 07/29/2019 1849   GLUCOSEU NEGATIVE 07/29/2019 1849   HGBUR NEGATIVE 07/29/2019 1849   BILIRUBINUR NEGATIVE 07/29/2019 1849   KETONESUR NEGATIVE 07/29/2019 1849   PROTEINUR NEGATIVE 07/29/2019 1849   UROBILINOGEN 0.2 03/29/2014 1546   NITRITE NEGATIVE 07/29/2019 1849   LEUKOCYTESUR NEGATIVE 07/29/2019 1849   Sepsis Labs Invalid input(s):  PROCALCITONIN,  WBC,  LACTICIDVEN Microbiology Recent Results (from the past 240 hour(s))  Respiratory Panel by RT PCR (Flu A&B, Covid) - Nasopharyngeal Swab     Status: Abnormal   Collection Time: 10/31/19  9:05 PM   Specimen: Nasopharyngeal Swab  Result Value Ref Range Status   SARS Coronavirus 2 by RT PCR POSITIVE (A) NEGATIVE Final    Comment: CRITICAL RESULT CALLED TO, READ BACK BY AND VERIFIED WITH: HALL, J. @ 0926 11/01/2019 PERRY, J. (NOTE) SARS-CoV-2 target nucleic acids are DETECTED. SARS-CoV-2 RNA is generally detectable in upper respiratory specimens  during the acute phase of infection. Positive results are indicative of the presence of the identified virus, but do not rule out bacterial infection or co-infection with other pathogens not detected by the test. Clinical correlation with patient history and other diagnostic information is necessary to determine patient infection status. The expected result is Negative. Fact Sheet for Patients:  PinkCheek.be Fact Sheet for Healthcare Providers: GravelBags.it This test is not yet approved or cleared by the [place] FDA and  has been authorized for detection and/or diagnosis of SARS-CoV-2 by FDA under an Emergency Use Authorization (EUA).  This EUA will remain in effect (meaning this test can be  used) for the duration of  the COVID-19 declaration under Section 564(b)(1) of the Act, 21 U.S.C. section 360bbb-3(b)(1), unless the authorization is terminated or revoked sooner.    Influenza A by PCR NEGATIVE NEGATIVE Final   Influenza B by PCR NEGATIVE NEGATIVE Final    Comment: (NOTE) The Xpert Xpress SARS-CoV-2/FLU/RSV assay is intended as an aid in  the diagnosis of influenza from Nasopharyngeal swab specimens and  should not be used as a sole basis for treatment. Nasal washings and  aspirates are unacceptable for Xpert Xpress SARS-CoV-2/FLU/RSV  testing. Fact  Sheet for Patients: PinkCheek.be Fact Sheet for Healthcare Providers: GravelBags.it This test is not yet approved or  cleared by the [place] and  has been authorized for detection and/or diagnosis of SARS-CoV-2 by  FDA under an Emergency Use Authorization (EUA). This EUA will remain  in effect (meaning this test can be used) for the duration of the  Covid-19 declaration under Section 564(b)(1) of the Act, 21  U.S.C. section 360bbb-3(b)(1), unless the authorization is  terminated or revoked. Performed at [facility], [place] [address] [place], [place] [address]      Time coordinating discharge: Over 30 minutes  SIGNED:   Desiree Hane, MD  [facility] 11/04/2019, 2:18 PM Pager   If 7PM-7AM, please contact night-coverage [email or web address] Password [email or web address]

## 2019-11-04 NOTE — Evaluation (Signed)
SATURATION QUALIFICATIONS: (This note is used to comply with regulatory documentation for home oxygen)  Patient Saturations on Room Air at Rest = 98%  Patient Saturations on Room Air while Ambulating =95%  Patient Saturations on 0 Liters of oxygen while Ambulating = 95-98%  Patients remained on room air throughout the assessment.  Please briefly explain why patient needs home oxygen: Based on the results of the assessment, patient does not require home oxygen.

## 2019-11-05 ENCOUNTER — Telehealth: Payer: Self-pay | Admitting: *Deleted

## 2019-11-05 LAB — TYPE AND SCREEN
ABO/RH(D): O POS
Antibody Screen: NEGATIVE
Unit division: 0
Unit division: 0

## 2019-11-05 LAB — BPAM RBC
Blood Product Expiration Date: 202104042359
Blood Product Expiration Date: 202104042359
Unit Type and Rh: 5100
Unit Type and Rh: 5100

## 2019-11-05 NOTE — Telephone Encounter (Signed)
Tried calling pt to make hosp follow=up appt.. there was no answer LMOM RTC./lmb

## 2019-11-06 NOTE — Telephone Encounter (Signed)
Called pt/son still no answer LMOM RTC to make hosp f/u w/PCP.Marland KitchenJohny [name]

## 2019-11-06 NOTE — Telephone Encounter (Signed)
Called pt/son again =no answer LMOM RTC.Marland KitchenJohny [name]

## 2019-11-07 NOTE — Telephone Encounter (Signed)
Have tried reaching out to pt/son on several occasion and LMOM no call back, nor has appt been scheduled. Closing encounter.Marland KitchenJohny Chess

## 2019-12-26 ENCOUNTER — Other Ambulatory Visit: Payer: Self-pay

## 2019-12-26 ENCOUNTER — Encounter (HOSPITAL_COMMUNITY): Payer: Self-pay

## 2019-12-26 ENCOUNTER — Emergency Department (HOSPITAL_COMMUNITY): Payer: Medicare (Managed Care)

## 2019-12-26 ENCOUNTER — Inpatient Hospital Stay (HOSPITAL_COMMUNITY)
Admission: EM | Admit: 2019-12-26 | Discharge: 2020-01-04 | DRG: 291 | Disposition: A | Discharge status: Home Health | Payer: Medicare (Managed Care) | Attending: Family Medicine | Admitting: Family Medicine

## 2019-12-26 DIAGNOSIS — I130 Hypertensive heart and chronic kidney disease with heart failure and stage 1 through stage 4 chronic kidney disease, or unspecified chronic kidney disease: Principal | ICD-10-CM | POA: Diagnosis present

## 2019-12-26 DIAGNOSIS — Z955 Presence of coronary angioplasty implant and graft: Secondary | ICD-10-CM

## 2019-12-26 DIAGNOSIS — I509 Heart failure, unspecified: Secondary | ICD-10-CM | POA: Diagnosis not present

## 2019-12-26 DIAGNOSIS — E1122 Type 2 diabetes mellitus with diabetic chronic kidney disease: Secondary | ICD-10-CM | POA: Diagnosis present

## 2019-12-26 DIAGNOSIS — I5033 Acute on chronic diastolic (congestive) heart failure: Secondary | ICD-10-CM | POA: Diagnosis present

## 2019-12-26 DIAGNOSIS — E611 Iron deficiency: Secondary | ICD-10-CM | POA: Diagnosis present

## 2019-12-26 DIAGNOSIS — Z833 Family history of diabetes mellitus: Secondary | ICD-10-CM

## 2019-12-26 DIAGNOSIS — K7030 Alcoholic cirrhosis of liver without ascites: Secondary | ICD-10-CM | POA: Diagnosis present

## 2019-12-26 DIAGNOSIS — Z800 Family history of malignant neoplasm of digestive organs: Secondary | ICD-10-CM

## 2019-12-26 DIAGNOSIS — Z8711 Personal history of peptic ulcer disease: Secondary | ICD-10-CM

## 2019-12-26 DIAGNOSIS — E785 Hyperlipidemia, unspecified: Secondary | ICD-10-CM | POA: Diagnosis present

## 2019-12-26 DIAGNOSIS — K7031 Alcoholic cirrhosis of liver with ascites: Secondary | ICD-10-CM

## 2019-12-26 DIAGNOSIS — Z951 Presence of aortocoronary bypass graft: Secondary | ICD-10-CM

## 2019-12-26 DIAGNOSIS — Z87442 Personal history of urinary calculi: Secondary | ICD-10-CM

## 2019-12-26 DIAGNOSIS — K5900 Constipation, unspecified: Secondary | ICD-10-CM | POA: Diagnosis present

## 2019-12-26 DIAGNOSIS — N183 Chronic kidney disease, stage 3 unspecified: Secondary | ICD-10-CM | POA: Diagnosis present

## 2019-12-26 DIAGNOSIS — K219 Gastro-esophageal reflux disease without esophagitis: Secondary | ICD-10-CM | POA: Diagnosis present

## 2019-12-26 DIAGNOSIS — E118 Type 2 diabetes mellitus with unspecified complications: Secondary | ICD-10-CM | POA: Diagnosis present

## 2019-12-26 DIAGNOSIS — N179 Acute kidney failure, unspecified: Secondary | ICD-10-CM | POA: Diagnosis present

## 2019-12-26 DIAGNOSIS — Z7984 Long term (current) use of oral hypoglycemic drugs: Secondary | ICD-10-CM

## 2019-12-26 DIAGNOSIS — I2510 Atherosclerotic heart disease of native coronary artery without angina pectoris: Secondary | ICD-10-CM | POA: Diagnosis present

## 2019-12-26 DIAGNOSIS — D61818 Other pancytopenia: Secondary | ICD-10-CM | POA: Diagnosis present

## 2019-12-26 DIAGNOSIS — Z801 Family history of malignant neoplasm of trachea, bronchus and lung: Secondary | ICD-10-CM

## 2019-12-26 DIAGNOSIS — Z8616 Personal history of COVID-19: Secondary | ICD-10-CM

## 2019-12-26 DIAGNOSIS — Z79899 Other long term (current) drug therapy: Secondary | ICD-10-CM

## 2019-12-26 DIAGNOSIS — J4520 Mild intermittent asthma, uncomplicated: Secondary | ICD-10-CM | POA: Diagnosis present

## 2019-12-26 DIAGNOSIS — Z87891 Personal history of nicotine dependence: Secondary | ICD-10-CM

## 2019-12-26 DIAGNOSIS — Z8249 Family history of ischemic heart disease and other diseases of the circulatory system: Secondary | ICD-10-CM

## 2019-12-26 DIAGNOSIS — Z87820 Personal history of traumatic brain injury: Secondary | ICD-10-CM

## 2019-12-26 DIAGNOSIS — G4733 Obstructive sleep apnea (adult) (pediatric): Secondary | ICD-10-CM | POA: Diagnosis present

## 2019-12-26 DIAGNOSIS — E1165 Type 2 diabetes mellitus with hyperglycemia: Secondary | ICD-10-CM | POA: Diagnosis present

## 2019-12-26 DIAGNOSIS — R188 Other ascites: Secondary | ICD-10-CM

## 2019-12-26 DIAGNOSIS — E1142 Type 2 diabetes mellitus with diabetic polyneuropathy: Secondary | ICD-10-CM | POA: Diagnosis present

## 2019-12-26 DIAGNOSIS — E662 Morbid (severe) obesity with alveolar hypoventilation: Secondary | ICD-10-CM | POA: Diagnosis present

## 2019-12-26 HISTORY — DX: Atherosclerotic heart disease of native coronary artery without angina pectoris: I25.10

## 2019-12-26 LAB — BASIC METABOLIC PANEL
Anion gap: 10 (ref 5–15)
BUN: 26 mg/dL — ABNORMAL HIGH (ref 8–23)
CO2: 23 mmol/L (ref 22–32)
Calcium: 8.7 mg/dL — ABNORMAL LOW (ref 8.9–10.3)
Chloride: 106 mmol/L (ref 98–111)
Creatinine, Ser: 1.24 mg/dL (ref 0.61–1.24)
GFR calc Af Amer: >60 mL/min (ref >60)
GFR calc non Af Amer: 57 mL/min — ABNORMAL LOW (ref >60)
Glucose, Bld: 146 mg/dL — ABNORMAL HIGH (ref 70–99)
Potassium: 3.9 mmol/L (ref 3.5–5.1)
Sodium: 139 mmol/L (ref 135–145)

## 2019-12-26 LAB — CBC
HCT: 27.1 % — ABNORMAL LOW (ref 39.0–52.0)
Hemoglobin: 7.5 g/dL — ABNORMAL LOW (ref 13.0–17.0)
MCH: 23.1 pg — ABNORMAL LOW (ref 26.0–34.0)
MCHC: 27.7 g/dL — ABNORMAL LOW (ref 30.0–36.0)
MCV: 83.4 fL (ref 80.0–100.0)
Platelets: 125 10*3/uL — ABNORMAL LOW (ref 150–400)
RBC: 3.25 MIL/uL — ABNORMAL LOW (ref 4.22–5.81)
RDW: 17.1 % — ABNORMAL HIGH (ref 11.5–15.5)
WBC: 4.2 10*3/uL (ref 4.0–10.5)
nRBC: 0 % (ref 0.0–0.2)

## 2019-12-26 MED ORDER — FUROSEMIDE 10 MG/ML IJ SOLN
80.0000 mg | INTRAMUSCULAR | Status: AC
  Administered 2019-12-27: 01:00:00 80 mg via INTRAVENOUS
  Filled 2019-12-26: qty 8

## 2019-12-26 NOTE — ED Triage Notes (Addendum)
Pt sts hx CHF and is having trouble breathing for for a week. Wanted to see PCP but could not get in touch. Took his medications pta. Sts coronavirus positive 10/31/19

## 2019-12-27 ENCOUNTER — Emergency Department (HOSPITAL_COMMUNITY): Payer: Medicare (Managed Care)

## 2019-12-27 ENCOUNTER — Observation Stay (HOSPITAL_COMMUNITY): Payer: Medicare (Managed Care)

## 2019-12-27 ENCOUNTER — Encounter (HOSPITAL_COMMUNITY): Payer: Self-pay

## 2019-12-27 DIAGNOSIS — Z8711 Personal history of peptic ulcer disease: Secondary | ICD-10-CM | POA: Diagnosis not present

## 2019-12-27 DIAGNOSIS — K7031 Alcoholic cirrhosis of liver with ascites: Secondary | ICD-10-CM | POA: Diagnosis not present

## 2019-12-27 DIAGNOSIS — J4520 Mild intermittent asthma, uncomplicated: Secondary | ICD-10-CM | POA: Diagnosis present

## 2019-12-27 DIAGNOSIS — I130 Hypertensive heart and chronic kidney disease with heart failure and stage 1 through stage 4 chronic kidney disease, or unspecified chronic kidney disease: Secondary | ICD-10-CM | POA: Diagnosis present

## 2019-12-27 DIAGNOSIS — E118 Type 2 diabetes mellitus with unspecified complications: Secondary | ICD-10-CM | POA: Diagnosis present

## 2019-12-27 DIAGNOSIS — K219 Gastro-esophageal reflux disease without esophagitis: Secondary | ICD-10-CM | POA: Diagnosis present

## 2019-12-27 DIAGNOSIS — Z87442 Personal history of urinary calculi: Secondary | ICD-10-CM | POA: Diagnosis not present

## 2019-12-27 DIAGNOSIS — K7030 Alcoholic cirrhosis of liver without ascites: Secondary | ICD-10-CM | POA: Diagnosis present

## 2019-12-27 DIAGNOSIS — N1831 Chronic kidney disease, stage 3a: Secondary | ICD-10-CM | POA: Diagnosis not present

## 2019-12-27 DIAGNOSIS — N179 Acute kidney failure, unspecified: Secondary | ICD-10-CM | POA: Diagnosis present

## 2019-12-27 DIAGNOSIS — E785 Hyperlipidemia, unspecified: Secondary | ICD-10-CM | POA: Diagnosis present

## 2019-12-27 DIAGNOSIS — Z801 Family history of malignant neoplasm of trachea, bronchus and lung: Secondary | ICD-10-CM | POA: Diagnosis not present

## 2019-12-27 DIAGNOSIS — Z8616 Personal history of COVID-19: Secondary | ICD-10-CM | POA: Diagnosis not present

## 2019-12-27 DIAGNOSIS — Z79899 Other long term (current) drug therapy: Secondary | ICD-10-CM | POA: Diagnosis not present

## 2019-12-27 DIAGNOSIS — Z800 Family history of malignant neoplasm of digestive organs: Secondary | ICD-10-CM | POA: Diagnosis not present

## 2019-12-27 DIAGNOSIS — E1165 Type 2 diabetes mellitus with hyperglycemia: Secondary | ICD-10-CM | POA: Diagnosis present

## 2019-12-27 DIAGNOSIS — D61818 Other pancytopenia: Secondary | ICD-10-CM | POA: Diagnosis present

## 2019-12-27 DIAGNOSIS — E1142 Type 2 diabetes mellitus with diabetic polyneuropathy: Secondary | ICD-10-CM | POA: Diagnosis present

## 2019-12-27 DIAGNOSIS — K5900 Constipation, unspecified: Secondary | ICD-10-CM | POA: Diagnosis present

## 2019-12-27 DIAGNOSIS — N183 Chronic kidney disease, stage 3 unspecified: Secondary | ICD-10-CM | POA: Diagnosis present

## 2019-12-27 DIAGNOSIS — I509 Heart failure, unspecified: Secondary | ICD-10-CM

## 2019-12-27 DIAGNOSIS — E1122 Type 2 diabetes mellitus with diabetic chronic kidney disease: Secondary | ICD-10-CM | POA: Diagnosis present

## 2019-12-27 DIAGNOSIS — I2510 Atherosclerotic heart disease of native coronary artery without angina pectoris: Secondary | ICD-10-CM | POA: Diagnosis present

## 2019-12-27 DIAGNOSIS — Z833 Family history of diabetes mellitus: Secondary | ICD-10-CM | POA: Diagnosis not present

## 2019-12-27 DIAGNOSIS — Z8249 Family history of ischemic heart disease and other diseases of the circulatory system: Secondary | ICD-10-CM | POA: Diagnosis not present

## 2019-12-27 DIAGNOSIS — G4733 Obstructive sleep apnea (adult) (pediatric): Secondary | ICD-10-CM

## 2019-12-27 DIAGNOSIS — E662 Morbid (severe) obesity with alveolar hypoventilation: Secondary | ICD-10-CM | POA: Diagnosis present

## 2019-12-27 DIAGNOSIS — E611 Iron deficiency: Secondary | ICD-10-CM | POA: Diagnosis present

## 2019-12-27 DIAGNOSIS — I5033 Acute on chronic diastolic (congestive) heart failure: Secondary | ICD-10-CM | POA: Diagnosis present

## 2019-12-27 LAB — RESPIRATORY PANEL BY RT PCR (FLU A&B, COVID)
Influenza A by PCR: NEGATIVE
Influenza B by PCR: NEGATIVE
SARS Coronavirus 2 by RT PCR: POSITIVE — AB

## 2019-12-27 LAB — TROPONIN I (HIGH SENSITIVITY): Troponin I (High Sensitivity): 9 ng/L (ref <18)

## 2019-12-27 LAB — BRAIN NATRIURETIC PEPTIDE: B Natriuretic Peptide: 266.2 pg/mL — ABNORMAL HIGH (ref 0.0–100.0)

## 2019-12-27 LAB — GLUCOSE, CAPILLARY
Glucose-Capillary: 83 mg/dL (ref 70–99)
Glucose-Capillary: 107 mg/dL — ABNORMAL HIGH (ref 70–99)

## 2019-12-27 LAB — CBG MONITORING, ED
Glucose-Capillary: 90 mg/dL (ref 70–99)
Glucose-Capillary: 125 mg/dL — ABNORMAL HIGH (ref 70–99)

## 2019-12-27 LAB — MAGNESIUM: Magnesium: 2.3 mg/dL (ref 1.7–2.4)

## 2019-12-27 MED ORDER — ENOXAPARIN SODIUM 40 MG/0.4ML ~~LOC~~ SOLN
40.0000 mg | SUBCUTANEOUS | Status: DC
  Administered 2019-12-28 – 2020-01-03 (×8): 40 mg via SUBCUTANEOUS
  Filled 2019-12-27 (×9): qty 0.4

## 2019-12-27 MED ORDER — GABAPENTIN 100 MG PO CAPS
100.0000 mg | ORAL_CAPSULE | Freq: Two times a day (BID) | ORAL | Status: DC
  Administered 2019-12-27 – 2020-01-04 (×17): 100 mg via ORAL
  Filled 2019-12-27 (×17): qty 1

## 2019-12-27 MED ORDER — SODIUM CHLORIDE (PF) 0.9 % IJ SOLN
INTRAMUSCULAR | Status: AC
  Filled 2019-12-27: qty 50

## 2019-12-27 MED ORDER — UMECLIDINIUM-VILANTEROL 62.5-25 MCG/INH IN AEPB
1.0000 | INHALATION_SPRAY | Freq: Every day | RESPIRATORY_TRACT | Status: DC
  Administered 2019-12-29 – 2020-01-04 (×7): 1 via RESPIRATORY_TRACT
  Filled 2019-12-27 (×2): qty 14

## 2019-12-27 MED ORDER — ALBUTEROL SULFATE (2.5 MG/3ML) 0.083% IN NEBU
2.5000 mg | INHALATION_SOLUTION | RESPIRATORY_TRACT | Status: DC | PRN
  Administered 2019-12-28: 2.5 mg via RESPIRATORY_TRACT
  Filled 2019-12-27: qty 3

## 2019-12-27 MED ORDER — ONDANSETRON HCL 4 MG/2ML IJ SOLN: 4.0000 mg | Freq: Four times a day (QID) | INTRAMUSCULAR | Status: DC | PRN

## 2019-12-27 MED ORDER — INSULIN ASPART 100 UNIT/ML ~~LOC~~ SOLN
0.0000 [IU] | Freq: Three times a day (TID) | SUBCUTANEOUS | Status: DC
  Administered 2019-12-27 – 2019-12-29 (×5): 2 [IU] via SUBCUTANEOUS
  Administered 2019-12-29: 3 [IU] via SUBCUTANEOUS
  Administered 2019-12-30 – 2019-12-31 (×3): 2 [IU] via SUBCUTANEOUS
  Administered 2020-01-01 – 2020-01-02 (×4): 3 [IU] via SUBCUTANEOUS
  Administered 2020-01-03: 2 [IU] via SUBCUTANEOUS
  Administered 2020-01-04: 3 [IU] via SUBCUTANEOUS
  Filled 2019-12-27: qty 0.15

## 2019-12-27 MED ORDER — NITROGLYCERIN 0.4 MG SL SUBL: 0.4000 mg | SUBLINGUAL_TABLET | SUBLINGUAL | Status: DC | PRN

## 2019-12-27 MED ORDER — ACETAMINOPHEN 325 MG PO TABS: 650.0000 mg | ORAL_TABLET | ORAL | Status: DC | PRN

## 2019-12-27 MED ORDER — SODIUM CHLORIDE 0.9 % IV SOLN: 250.0000 mL | INTRAVENOUS | Status: DC | PRN

## 2019-12-27 MED ORDER — SODIUM CHLORIDE 0.9% FLUSH
3.0000 mL | Freq: Two times a day (BID) | INTRAVENOUS | Status: DC
  Administered 2019-12-28 – 2020-01-04 (×12): 3 mL via INTRAVENOUS

## 2019-12-27 MED ORDER — SODIUM CHLORIDE 0.9% FLUSH: 3.0000 mL | INTRAVENOUS | Status: DC | PRN

## 2019-12-27 MED ORDER — FUROSEMIDE 10 MG/ML IJ SOLN
80.0000 mg | Freq: Two times a day (BID) | INTRAMUSCULAR | Status: DC
  Administered 2019-12-27: 80 mg via INTRAVENOUS
  Filled 2019-12-27: qty 8

## 2019-12-27 MED ORDER — IOHEXOL 350 MG/ML SOLN
100.0000 mL | Freq: Once | INTRAVENOUS | Status: AC | PRN
  Administered 2019-12-27: 100 mL via INTRAVENOUS

## 2019-12-27 MED ORDER — AEROCHAMBER PLUS FLO-VU MEDIUM MISC
1.0000 | Freq: Once | Status: AC
  Administered 2019-12-27: 02:00:00 1
  Filled 2019-12-27: qty 1

## 2019-12-27 MED ORDER — ALBUTEROL SULFATE HFA 108 (90 BASE) MCG/ACT IN AERS
4.0000 | INHALATION_SPRAY | Freq: Once | RESPIRATORY_TRACT | Status: AC
  Administered 2019-12-27: 02:00:00 4 via RESPIRATORY_TRACT
  Filled 2019-12-27: qty 6.7

## 2019-12-27 NOTE — ED Provider Notes (Signed)
Assumed care from Dr. Rex Kras.  Patient with a history of CHF, asthma, hypertension, cirrhosis with progressively worsening shortness of breath with exertion.  He is awaiting CT angiogram.  CTA is negative for infiltrate or PE. Does show pulmonary edema.  Patient was given IV Lasix.  he does become dyspneic with exertion and desats to 87%.  He is agreeable to stay for IV diuresis.  Admission d/w Dr. Marlyce Huge.    Ezequiel Essex, MD 12/27/19 [phone]

## 2019-12-27 NOTE — ED Notes (Signed)
Nasal cannula at bedside if needed. Pt states he just needs a second to catch his breath. 02 sats returned to 95% on room air.

## 2019-12-27 NOTE — ED Notes (Signed)
Pt. Ambulated down the hall and back to his room on 88% room air and 81 heart rate.Pt. Gate steady on his feet.

## 2019-12-27 NOTE — Progress Notes (Signed)
[name] is a 74 y.o. male with a history of HFpEF, CKD 3, type 2 diabetes, cirrhosis, COVID-19 infection diagnosed 10/31/2019, CAD status post DES x2 and CABG, diabetic peripheral neuropathy, iron deficiency anemia, asthma who was admitted early this morning by Dr. Cyd Silence for increasing shortness of breath and bilateral lower extremity edema x2 weeks and admitted for HFpEF exacerbation.  Currently, patient states his breathing is better but still feels volume overloaded with distended abdomen and swollen lower extremities   PE: General: Awake and alert, no acute distress  Heart: S1 and S2 auscultated, no murmurs  Lungs: Clear to auscultation bilaterally, no wheeze  Abdomen: Edematous MSK: Bilateral lower extremity 2-3+ pitting edema   A/P  1. HFpEF exacerbation  a. Continue I/O and daily weights b. Continue twice daily Lasix 2. Cirrhosis without ascites 3. Recent COVID-19 infection a. After discussion with infection prevention, precautions can be discontinued as patient was diagnosed on 10/31/2019 4. CAD with history of DES and CABG, stable 5. Type 2 diabetes currently with sliding scale 6. Controlled mild admission asthma 7. GERD 8. OSA on CPAP    Harold Hedge, DO [facility] Pager [phone]

## 2019-12-27 NOTE — ED Provider Notes (Signed)
[facility] DEPT Provider Note   CSN: [id number] Arrival date & time: 12/26/19  1940     History Chief Complaint  Patient presents with  . Shortness of Breath    [name] is a 74 y.o. male.  74yo M w/ PMH including CHF, asthma, T2DM, HTN, cirrhosis who p/w SOB. PT reports ~1 week of progressively worsening SOB, especially with exertion. He has had increased edema of legs and abdomen, does not check daily weights. He reports PND, having a hard time sleeping recently. He reports compliance w/ medications. He reports some cough, had COVID-19 in March. He was admitted in March for CHF exacerbation and states this feels similar to that presentation.   The history is provided by the patient.  Shortness of Breath      Past Medical History:  Diagnosis Date  . Arthritis    "joints tighten up sometimes" (03/27/2104)  . Asthma   . CHF (congestive heart failure) ([place])   . Chronic low back pain   . Chronic lower back pain   . Coronary artery disease    a. 05/2012 Cath/PCI: LM 40ost, 50-60d, LAD 99p ruptured plaque (3.0x28 DES), LCX 50p/m, RCA 30-40p, 71m EF 65-70%.  . Family history of adverse reaction to anesthesia    Pt granddaughter had PONV  . GERD (gastroesophageal reflux disease)   . Hepatic cirrhosis ([place]    a. Dx 01/2014 - CT a/p   . History of blood transfusion    "related to bleeding ulcers"  . History of concussion    1976--  NO RESIDUAL  . History of GI bleed    a. UGIB 07/2012;  b. 01/2014 admission with GIB/FOB stool req 1U prbc's->EGD showed portal gastropathy, barrett's esoph, and chronic active h. pylori gastritis.  .Marland KitchenHistory of gout    2007 &  2008  LEFT LEG-- NO ISSUE SINCE  . Hyperlipidemia   . Iron deficiency anemia   . Kidney stones   . Lipoma   . OA (osteoarthritis of spine)    LOWER BACK--  INTERMITTANT LEFT LEG NUMBNESS  . OSA (obstructive sleep apnea)    PULMOLOGIST-  DR CLANCE--  MODERATE OSA  STARTED CPAP 2012--   BUT CURRENTLY HAS NOT USED PAST 6 MONTHS  . Phimosis    a. s/p circumcision 2015.  . Type 2 diabetes mellitus ([place]   . Unspecified essential hypertension     Patient Active Problem List   Diagnosis Date Noted  . COVID-19 virus infection 11/04/2019  . Acute on chronic diastolic heart failure ([place] 11/01/2019  . Volume overload 07/29/2019  . Itching 03/22/2019  . Skin lesion 03/22/2019  . Hyponatremia 12/24/2018  . (HFpEF) heart failure with preserved ejection fraction ([place] 12/24/2018  . Precordial chest pain   . Neck pain 09/12/2018  . CKD (chronic kidney disease), stage III 12/18/2017  . Acute on chronic anemia 12/18/2017  . Peptic ulcer disease 12/18/2017  . GI bleed 09/29/2017  . Shortness of breath 09/21/2017  . Morbid obesity ([place] 02/05/2016  . Numerous moles 09/15/2015  . S/P CABG x 3 03/31/2014  . Hepatic cirrhosis ([place] 02/27/2014  . Acute blood loss anemia 08/13/2012  . Hyperlipidemia with target LDL less than 70 06/29/2012  . Thrombocytopenia ([place] 06/29/2012  . Coronary artery disease    . OSA (obstructive sleep apnea)   . Type 2 diabetes with complication (HC[place] 102/63/7858 . GERD 08/04/2010  . History of gout   . Hypertensive heart disease  Past Surgical History:  Procedure Laterality Date  . ANKLE FRACTURE SURGERY Right 1989   "plate put in"  . APPENDECTOMY  05-16-2004   open  . CIRCUMCISION N/A 09/09/2013   Procedure: CIRCUMCISION ADULT;  Surgeon: Bernestine Amass, MD;  Location: [facility];  Service: Urology;  Laterality: N/A;  . COLECTOMY  05-16-2004  . CORONARY ANGIOPLASTY WITH STENT PLACEMENT  06/28/2012  DR COOPER   PCI W/  X1 DES to [place]. LAD/  LM  40% OSTIAL & 50-60% DISTAL /  50% PROX LCX/  30-40% PROX RCA & 50% MID RCA/   LVEF 65-70%  . CORONARY ARTERY BYPASS GRAFT N/A 03/31/2014   Procedure: CORONARY ARTERY BYPASS GRAFTING (CABG) times 3 using left internal mammary artery and right saphenous vein.;  Surgeon: Melrose Nakayama, MD;  Location: [facility];  Service: Open Heart Surgery;  Laterality: N/A;  . DOBUTAMINE STRESS ECHO  06-08-2012   MODERATE HYPOKINESIS/ ISCHEMIA MID INFERIOR WALL  . ESOPHAGOGASTRODUODENOSCOPY  08/15/2012   Procedure: ESOPHAGOGASTRODUODENOSCOPY (EGD);  Surgeon: Wonda Horner, MD;  Location: [facility] ENDOSCOPY;  Service: Endoscopy;  Laterality: N/A;  . ESOPHAGOGASTRODUODENOSCOPY N/A 02/17/2014   Procedure: ESOPHAGOGASTRODUODENOSCOPY (EGD);  Surgeon: Jeryl Columbia, MD;  Location: [facility] ENDOSCOPY;  Service: Endoscopy;  Laterality: N/A;  . ESOPHAGOGASTRODUODENOSCOPY (EGD) WITH PROPOFOL N/A 09/30/2017   Procedure: ESOPHAGOGASTRODUODENOSCOPY (EGD) WITH PROPOFOL;  Surgeon: Wonda Horner, MD;  Location: [facility] ENDOSCOPY;  Service: Endoscopy;  Laterality: N/A;  . ESOPHAGOGASTRODUODENOSCOPY (EGD) WITH PROPOFOL N/A 12/20/2017   Procedure: ESOPHAGOGASTRODUODENOSCOPY (EGD) WITH PROPOFOL;  Surgeon: Clarene Essex, MD;  Location: [facility];  Service: Endoscopy;  Laterality: N/A;  . HERNIA REPAIR    . INTRAOPERATIVE TRANSESOPHAGEAL ECHOCARDIOGRAM N/A 03/31/2014   Procedure: INTRAOPERATIVE TRANSESOPHAGEAL ECHOCARDIOGRAM;  Surgeon: Melrose Nakayama, MD;  Location: [facility];  Service: Open Heart Surgery;  Laterality: N/A;  . LAPAROSCOPIC UMBILICAL HERNIA REPAIR W/ MESH  06-06-2011  . LEFT HEART CATHETERIZATION WITH CORONARY ANGIOGRAM N/A 03/28/2014   Procedure: LEFT HEART CATHETERIZATION WITH CORONARY ANGIOGRAM;  Surgeon: Sinclair Grooms, MD;  Location[facility] C[facility] LAB;  Service: Cardiovascular;  Laterality: N/A;  . LIPOMA EXCISION Left 08/25/2017   Procedure: EXCISION LIPOMA LEFT POSTERIOR THIGH;  Surgeon: Clovis Riley, MD;  Location: [facility];  Service: General;  Laterality: Left;  . NEPHROLITHOTOMY  1990'S  . OPEN APPENDECTOMY W/ PARTIAL CECECTOMY  05-16-2004  . PERCUTANEOUS CORONARY STENT INTERVENTION (PCI-S) N/A 06/28/2012   Procedure: PERCUTANEOUS CORONARY STENT INTERVENTION (PCI-S);  Surgeon: Sherren Mocha, MD;   Location: [facility] CATH LAB;  Service: Cardiovascular;  Laterality: N/A;       Family History  Problem Relation Age of Onset  . Lung cancer Sister   . Cancer Sister        lung  . Cancer Mother   . Cancer Father        died in his 66s.  . Cancer Brother        lung  . Coronary artery disease Other   . Diabetes Other   . Colon cancer Other   . Cancer Sister        lung    Social History   Tobacco Use  . Smoking status: Former Smoker    Packs/day: 2.00    Years: 40.00    Pack years: 80.00    Types: Cigarettes    Quit date: 08/29/1996    Years since quitting: 23.3  . Smokeless tobacco: Never Used  Substance Use Topics  . Alcohol use:  Yes    Alcohol/week: 0.0 standard drinks    Comment: occasional  . Drug use: No    Home Medications Prior to Admission medications   Medication Sig Start Date End Date Taking? Authorizing Provider  albuterol (VENTOLIN HFA) 108 (90 Base) MCG/ACT inhaler Inhale 2 puffs into the lungs every 6 (six) hours as needed for wheezing or shortness of breath.    [provider]  blood glucose meter kit and supplies Dispense based on patient and insurance preference. Use up to four times daily as directed. (FOR ICD-10 E10.9, E11.9). 05/02/19   [name], MD  ferrous sulfate 325 (65 FE) MG EC tablet Take 1 tablet (325 mg total) by mouth daily with breakfast. Patient taking differently: Take 325 mg by mouth daily with supper.  04/20/18   [name], MD  furosemide (LASIX) 80 MG tablet Take 80 mg by mouth daily.    [provider]  gabapentin (NEURONTIN) 100 MG capsule TAKE 1 CAPSULE(100 MG) BY MOUTH TWICE DAILY Patient taking differently: Take 100 mg by mouth 2 (two) times daily.  05/14/19   [name], MD  glimepiride (AMARYL) 2 MG tablet TAKE 1 TABLET(2 MG) BY MOUTH DAILY BEFORE BREAKFAST Patient taking differently: Take 2 mg by mouth daily with breakfast.  10/03/19   [name], MD  metFORMIN  (GLUCOPHAGE) 850 MG tablet Take 1 tablet (850 mg total) by mouth 2 (two) times daily with a meal. 05/30/19   [name], MD  nitroGLYCERIN (NITROSTAT) 0.4 MG SL tablet Place 1 tablet (0.4 mg total) under the tongue every 5 (five) minutes as needed for chest pain. 06/08/16   Nahser, Wonda Cheng, MD  spironolactone (ALDACTONE) 50 MG tablet Take 1 tablet (50 mg total) by mouth daily. Patient not taking: Reported on 10/31/2019 07/16/19   [name], MD  umeclidinium-vilanterol [facility] ELLIPTA) 62.5-25 MCG/INH AEPB Inhale 1 puff into the lungs daily. 02/05/19   [name], MD    Allergies    Patient has no known allergies.  Review of Systems   Review of Systems  Respiratory: Positive for shortness of breath.    All other systems reviewed and are negative except that which was mentioned in HPI  Physical Exam Updated Vital Signs BP (!) 162/75 (BP Location: Left Arm)   Pulse 81   Temp 98.4 F (36.9 C)   Resp 16   Ht '5\' 8"'  (1.727 m)   Wt 108.9 kg   SpO2 94%   BMI 36.49 kg/m   Physical Exam Vitals and nursing note reviewed.  Constitutional:      General: He is not in acute distress.    Appearance: He is well-developed. He is obese.  HENT:     Head: Normocephalic and atraumatic.  Eyes:     Conjunctiva/sclera: Conjunctivae normal.  Cardiovascular:     Rate and Rhythm: Normal rate and regular rhythm.     Heart sounds: Normal heart sounds. No murmur.  Pulmonary:     Comments: Mild tachypnea and increased WOB without respiratory distress, some upper airway wheezing that seems partly volitional, occasional lower airway wheezing Abdominal:     General: Bowel sounds are normal. There is no distension.     Palpations: Abdomen is soft.     Tenderness: There is no abdominal tenderness.  Musculoskeletal:     Cervical back: Neck supple.     Right lower leg: Edema present.     Left lower leg: Edema present.  Comments:  4+ pitting edema BLE  Skin:    General:  Skin is warm and dry.  Neurological:     Mental Status: He is alert and oriented to person, place, and time.     Comments: Fluent speech  Psychiatric:        Judgment: Judgment normal.     ED Results / Procedures / Treatments   Labs (all labs ordered are listed, but only abnormal results are displayed) Labs Reviewed  BASIC METABOLIC PANEL - Abnormal; Notable for the following components:      Result Value   Glucose, Bld 146 (*)    BUN 26 (*)    Calcium 8.7 (*)    GFR calc non Af Amer 57 (*)    All other components within normal limits  CBC - Abnormal; Notable for the following components:   RBC 3.25 (*)    Hemoglobin 7.5 (*)    HCT 27.1 (*)    MCH 23.1 (*)    MCHC 27.7 (*)    RDW 17.1 (*)    Platelets 125 (*)    All other components within normal limits  BRAIN NATRIURETIC PEPTIDE  TROPONIN I (HIGH SENSITIVITY)    EKG EKG Interpretation  Date/Time:  Thursday December 26 2019 19:49:49 EDT Ventricular Rate:  84 PR Interval:    QRS Duration: 107 QT Interval:  395 QTC Calculation: 467 R Axis:   110 Text Interpretation: Sinus rhythm Right axis deviation Low voltage, precordial leads 12 Lead; [place] similar to previous Confirmed by Theotis Burrow [phone]) on 12/26/2019 11:45:05 PM   Radiology DG Chest 2 View  Result Date: 12/26/2019 CLINICAL DATA:  Shortness of breath EXAM: CHEST - 2 VIEW COMPARISON:  10/31/2019 FINDINGS: Cardiac shadow is mildly enlarged but stable. Postsurgical changes are noted. Lungs are well aerated bilaterally. Mild vascular congestion again identified. Some slightly more prominent density is noted lateral aspect of the right in the minor fissure. This may represent some developing infiltrate. No other focal abnormality is noted. IMPRESSION: Changes consistent with mild vascular congestion. Some developing infiltrate in the right mid lung is noted. This may be related to the patient's known COVID status. Electronically Signed   By: Inez Catalina M.D.    On: 12/26/2019 21:07    Procedures Procedures (including critical care time)  Medications Ordered in ED Medications  furosemide (LASIX) injection 80 mg (has no administration in time range)  sodium chloride (PF) 0.9 % injection (has no administration in time range)    ED Course  I have reviewed the triage vital signs and the nursing notes.  Pertinent labs & imaging results that were available during my care of the patient were reviewed by me and considered in my medical decision making (see chart for details).    MDM Rules/Calculators/A&P                      VS notable for mild HTN, O2 sat normal on RA. Does appear to be volume overloaded. DDx includes PE or HCAP given his hospitalization last month. Cr at baseline, 1.24, Hgb 7.5 similar to previous. Trop normal and EKG similar to previous. CXR shows pulm vascular congestion, ?infiltrate R lung. I have ordered 1m IV lasix and will obtain CTA chest to r/o PE or pneumonia. I am signing out to oncoming provider pending CTA and BNP results and reassessment after lasix. Final Clinical Impression(s) / ED Diagnoses Final diagnoses:  None    Rx / DC Orders ED Discharge  Orders    None       Cheron Coryell, Wenda Overland, MD 12/27/19 [phone]

## 2019-12-27 NOTE — H&P (Signed)
History and Physical    [name] GLO:[id number] DOB: October 02, 1945 DOA: 12/26/2019  PCP: [name], MD  Patient coming from: Home   Chief Complaint:  Chief Complaint  Patient presents with  . Shortness of Breath     HPI:    74 year old male with past medical history of diastolic congestive heart failure, chronic kidney disease stage III, diabetes mellitus type 2, cirrhosis, COVID-19 infection in March 2021, coronary artery disease status post drug-eluting stents x2 in 2013 followed by CABG in 2015, diabetic peripheral polyneuropathy, iron deficiency anemia and asthma presents to [facility] emergency department with shortness of breath and lower extremity edema.  Patient explains that approximately 2 weeks ago he began to develop bilateral lower extremity edema beyond his baseline.  This bilateral lower extremity edema rapidly worsened over the next several days and began to track up his thighs and even up to his lower abdomen.  As the symptoms continue to worsen the patient also began to develop associated shortness of breath.  Patient shortness of breath is severe in intensity, worse with exertion and improved with rest.  This is also associated with cough productive of white sputum.  Patient complains of gradual weight gain over the span of time and increasing abdominal girth to the point where "it feels like my belly is about to burst."  Patient symptoms continue to worsen until he eventually presented to [facility] emergency department for evaluation.  Upon evaluation in the emergency department patient was clinically felt to be 7 from acute on chronic congestive heart failure.  CT angiogram of the chest was performed revealing no evidence of pulmonary embolism but instead finding evidence of mild pulmonary edema.  The patient was provided with 80 mg of intravenous Lasix and the hospitalist group was then called to assess the patient for admission to the  hospital.   Review of Systems: A 10-system review of systems has been performed and all systems are negative with the exception of what is listed in the HPI.   Past Medical History:  Diagnosis Date  . Arthritis    "joints tighten up sometimes" (03/27/2104)  . Asthma   . CHF (congestive heart failure) ([place])   . Chronic low back pain   . Chronic lower back pain   . Coronary artery disease    a. 05/2012 Cath/PCI: LM 40ost, 50-60d, LAD 99p ruptured plaque (3.0x28 DES), LCX 50p/m, RCA 30-40p, 90m EF 65-70%.  . Coronary artery disease involving native coronary artery of native heart without angina pectoris    Severe left main disease at catheterization July 2015  CABG x3 with a LIMA to the LAD, SVG to the OM, SVG to the PDA on 03/31/14. EF 60% by cath.   . Family history of adverse reaction to anesthesia    Pt granddaughter had PONV  . GERD (gastroesophageal reflux disease)   . GERD without esophagitis 08/04/2010  . Hepatic cirrhosis ([place]    a. Dx 01/2014 - CT a/p   . History of blood transfusion    "related to bleeding ulcers"  . History of concussion    1976--  NO RESIDUAL  . History of GI bleed    a. UGIB 07/2012;  b. 01/2014 admission with GIB/FOB stool req 1U prbc's->EGD showed portal gastropathy, barrett's esoph, and chronic active h. pylori gastritis.  .Marland KitchenHistory of gout    2007 &  2008  LEFT LEG-- NO ISSUE SINCE  . Hyperlipidemia   . Iron deficiency anemia   .  Kidney stones   . Lipoma   . OA (osteoarthritis of spine)    LOWER BACK--  INTERMITTANT LEFT LEG NUMBNESS  . OSA (obstructive sleep apnea)    PULMOLOGIST-  DR CLANCE--  MODERATE OSA  STARTED CPAP 2012--  BUT CURRENTLY HAS NOT USED PAST 6 MONTHS  . Phimosis    a. s/p circumcision 2015.  . Type 2 diabetes mellitus ([place])   . Unspecified essential hypertension     Past Surgical History:  Procedure Laterality Date  . ANKLE FRACTURE SURGERY Right 1989   "plate put in"  . APPENDECTOMY  05-16-2004   open  . CIRCUMCISION  N/A 09/09/2013   Procedure: CIRCUMCISION ADULT;  Surgeon: Bernestine Amass, MD;  Location[facility];  Service: Urology;  Laterality: N/A;  . COLECTOMY  05-16-2004  . CORONARY ANGIOPLASTY WITH STENT PLACEMENT  06/28/2012  DR COOPER   PCI W/  X1 DES to [place]. LAD/  LM  40% OSTIAL & 50-60% DISTAL /  50% PROX LCX/  30-40% PROX RCA & 50% MID RCA/   LVEF 65-70%  . CORONARY ARTERY BYPASS GRAFT N/A 03/31/2014   Procedure: CORONARY ARTERY BYPASS GRAFTING (CABG) times 3 using left internal mammary artery and right saphenous vein.;  Surgeon: Melrose Nakayama, MD;  Location: [place];  Service: Open Heart Surgery;  Laterality: N/A;  . DOBUTAMINE STRESS ECHO  06-08-2012   MODERATE HYPOKINESIS/ ISCHEMIA MID INFERIOR WALL  . ESOPHAGOGASTRODUODENOSCOPY  08/15/2012   Procedure: ESOPHAGOGASTRODUODENOSCOPY (EGD);  Surgeon: Wonda Horner, MD;  Location[facility] ENDOSCOPY;  Service: Endoscopy;  Laterality: N/A;  . ESOPHAGOGASTRODUODENOSCOPY N/A 02/17/2014   Procedure: ESOPHAGOGASTRODUODENOSCOPY (EGD);  Surgeon: Jeryl Columbia, MD;  Location[facility] ENDOSCOPY;  Service: Endoscopy;  Laterality: N/A;  . ESOPHAGOGASTRODUODENOSCOPY (EGD) WITH PROPOFOL N/A 09/30/2017   Procedure: ESOPHAGOGASTRODUODENOSCOPY (EGD) WITH PROPOFOL;  Surgeon: Wonda Horner, MD;  Location: [facility] ENDOSCOPY;  Service: Endoscopy;  Laterality: N/A;  . ESOPHAGOGASTRODUODENOSCOPY (EGD) WITH PROPOFOL N/A 12/20/2017   Procedure: ESOPHAGOGASTRODUODENOSCOPY (EGD) WITH PROPOFOL;  Surgeon: Clarene Essex, MD;  Location: [place];  Service: Endoscopy;  Laterality: N/A;  . HERNIA REPAIR    . INTRAOPERATIVE TRANSESOPHAGEAL ECHOCARDIOGRAM N/A 03/31/2014   Procedure: INTRAOPERATIVE TRANSESOPHAGEAL ECHOCARDIOGRAM;  Surgeon: Melrose Nakayama, MD;  Location: [place];  Service: Open Heart Surgery;  Laterality: N/A;  . LAPAROSCOPIC UMBILICAL HERNIA REPAIR W/ MESH  06-06-2011  . LEFT HEART CATHETERIZATION WITH CORONARY ANGIOGRAM N/A 03/28/2014   Procedure: LEFT HEART  CATHETERIZATION WITH CORONARY ANGIOGRAM;  Surgeon: Sinclair Grooms, MD;  Location: [facility] CATH LAB;  Service: Cardiovascular;  Laterality: N/A;  . LIPOMA EXCISION Left 08/25/2017   Procedure: EXCISION LIPOMA LEFT POSTERIOR THIGH;  Surgeon: Clovis Riley, MD;  Location: [place];  Service: General;  Laterality: Left;  . NEPHROLITHOTOMY  1990'S  . OPEN APPENDECTOMY W/ PARTIAL CECECTOMY  05-16-2004  . PERCUTANEOUS CORONARY STENT INTERVENTION (PCI-S) N/A 06/28/2012   Procedure: PERCUTANEOUS CORONARY STENT INTERVENTION (PCI-S);  Surgeon: Sherren Mocha, MD;  Location: [facility] CATH LAB;  Service: Cardiovascular;  Laterality: N/A;     reports that he quit smoking about 23 years ago. His smoking use included cigarettes. He has a 80.00 pack-year smoking history. He has never used smokeless tobacco. He reports current alcohol use of about 8.0 standard drinks of alcohol per week. He reports that he does not use drugs.  No Known Allergies  Family History  Problem Relation Age of Onset  . Lung cancer Sister   . Cancer Sister  lung  . Cancer Mother   . Cancer Father        died in his 43s.  . Cancer Brother        lung  . Coronary artery disease Other   . Diabetes Other   . Colon cancer Other   . Cancer Sister        lung     Prior to Admission medications   Medication Sig Start Date End Date Taking? Authorizing Provider  albuterol (VENTOLIN HFA) 108 (90 Base) MCG/ACT inhaler Inhale 2 puffs into the lungs every 6 (six) hours as needed for wheezing or shortness of breath.   Yes [provider]  ferrous sulfate 325 (65 FE) MG EC tablet Take 1 tablet (325 mg total) by mouth daily with breakfast. Patient taking differently: Take 325 mg by mouth daily with supper.  04/20/18  Yes [name], MD  furosemide (LASIX) 80 MG tablet Take 80 mg by mouth daily.   Yes [provider]  gabapentin (NEURONTIN) 100 MG capsule TAKE 1 CAPSULE(100 MG) BY MOUTH TWICE DAILY Patient taking  differently: Take 100 mg by mouth 2 (two) times daily.  05/14/19  Yes [name], MD  glimepiride (AMARYL) 2 MG tablet TAKE 1 TABLET(2 MG) BY MOUTH DAILY BEFORE BREAKFAST Patient taking differently: Take 2 mg by mouth daily with breakfast.  10/03/19  Yes [name], MD  metFORMIN (GLUCOPHAGE) 850 MG tablet Take 1 tablet (850 mg total) by mouth 2 (two) times daily with a meal. 05/30/19  Yes [name], MD  nitroGLYCERIN (NITROSTAT) 0.4 MG SL tablet Place 1 tablet (0.4 mg total) under the tongue every 5 (five) minutes as needed for chest pain. 06/08/16  Yes Nahser, Wonda Cheng, MD  umeclidinium-vilanterol (ANORO ELLIPTA) 62.5-25 MCG/INH AEPB Inhale 1 puff into the lungs daily. 02/05/19  Yes [name], MD  blood glucose meter kit and supplies Dispense based on patient and insurance preference. Use up to four times daily as directed. (FOR ICD-10 E10.9, E11.9). 05/02/19   [name], MD  spironolactone (ALDACTONE) 50 MG tablet Take 1 tablet (50 mg total) by mouth daily. Patient not taking: Reported on 10/31/2019 07/16/19   [name], MD    Physical Exam: Vitals:   12/27/19 0200 12/27/19 0215 12/27/19 0230 12/27/19 0300  BP: 129/63 140/78 136/67 110/89  Pulse: 82 93 85 83  Resp: 15 (!) '25 19 20  ' Temp:      TempSrc:      SpO2: 97% 96% 92% 96%  Weight:      Height:        Constitutional: Acute alert and oriented x3, patient is in mild respiratory distress.  Patient is obese. Skin: no rashes, no lesions, markedly thickened and darkened skin of the distal bilateral lower extremities consistent with chronic edema. Eyes: Pupils are equally reactive to light.  No evidence of scleral icterus or conjunctival pallor.  ENMT: Mucous membranes are moist. Posterior pharynx clear of any exudate or lesions. Normal dentition.   Neck: normal, supple, no masses, no thyromegaly.  Body habitus limits evaluation of JVP. Respiratory: Significant bilateral lower  and mid field rales with scattered rhonchi bilaterally and intermittent expiratory wheezing.  Normal respiratory effort. No accessory muscle use.  Cardiovascular: Regular rate and rhythm, no murmurs / rubs / gallops. No extremity edema. 2+ pedal pulses. No carotid bruits.  Back:   Nontender without crepitus or deformity. Abdomen: Abdomen is protuberant, soft and nontender.  No evidence  of intra-abdominal masses.  Positive bowel sounds noted in all quadrants.   Musculoskeletal: No joint deformity upper and lower extremities. Good ROM, no contractures. Normal muscle tone.  Neurologic: CN 2-12 grossly intact. Sensation intact, strength noted to be 5 out of 5 in all 4 extremities.  Patient is following all commands.  Patient is responsive to verbal stimuli.   Psychiatric: Patient presents as a normal mood with appropriate affect.  Patient seems to possess insight as to theircurrent situation.     Labs on Admission: I have personally reviewed following labs and imaging studies -   CBC: Recent Labs  Lab 12/26/19 2004  WBC 4.2  HGB 7.5*  HCT 27.1*  MCV 83.4  PLT 202*   Basic Metabolic Panel: Recent Labs  Lab 12/26/19 2004  NA 139  K 3.9  CL 106  CO2 23  GLUCOSE 146*  BUN 26*  CREATININE 1.24  CALCIUM 8.7*   GFR: Estimated Creatinine Clearance: 63.4 mL/min (by C-G formula based on SCr of 1.24 mg/dL). Liver Function Tests: No results for input(s): AST, ALT, ALKPHOS, BILITOT, PROT, ALBUMIN in the last 168 hours. No results for input(s): LIPASE, AMYLASE in the last 168 hours. No results for input(s): AMMONIA in the last 168 hours. Coagulation Profile: No results for input(s): INR, PROTIME in the last 168 hours. Cardiac Enzymes: No results for input(s): CKTOTAL, CKMB, CKMBINDEX, TROPONINI in the last 168 hours. BNP (last 3 results) No results for input(s): PROBNP in the last 8760 hours. HbA1C: No results for input(s): HGBA1C in the last 72 hours. CBG: No results for input(s):  GLUCAP in the last 168 hours. Lipid Profile: No results for input(s): CHOL, HDL, LDLCALC, TRIG, CHOLHDL, LDLDIRECT in the last 72 hours. Thyroid Function Tests: No results for input(s): TSH, T4TOTAL, FREET4, T3FREE, THYROIDAB in the last 72 hours. Anemia Panel: No results for input(s): VITAMINB12, FOLATE, FERRITIN, TIBC, IRON, RETICCTPCT in the last 72 hours. Urine analysis:    Component Value Date/Time   COLORURINE YELLOW 07/29/2019 1849   APPEARANCEUR CLEAR 07/29/2019 1849   LABSPEC 1.010 07/29/2019 1849   PHURINE 6.0 07/29/2019 1849   GLUCOSEU NEGATIVE 07/29/2019 1849   HGBUR NEGATIVE 07/29/2019 [place] 07/29/2019 [place] 07/29/2019 1849   PROTEINUR NEGATIVE 07/29/2019 1849   UROBILINOGEN 0.2 03/29/2014 1546   NITRITE NEGATIVE 07/29/2019 1849   LEUKOCYTESUR NEGATIVE 07/29/2019 1849    Radiological Exams on Admission personally reviewed: DG Chest 2 View  Result Date: 12/26/2019 CLINICAL DATA:  Shortness of breath EXAM: CHEST - 2 VIEW COMPARISON:  10/31/2019 FINDINGS: Cardiac shadow is mildly enlarged but stable. Postsurgical changes are noted. Lungs are well aerated bilaterally. Mild vascular congestion again identified. Some slightly more prominent density is noted lateral aspect of the right in the minor fissure. This may represent some developing infiltrate. No other focal abnormality is noted. IMPRESSION: Changes consistent with mild vascular congestion. Some developing infiltrate in the right mid lung is noted. This may be related to the patient's known COVID status. Electronically Signed   By: Inez Catalina M.D.   On: 12/26/2019 21:07   CT Angio Chest PE W/Cm &/Or Wo Cm  Result Date: 12/27/2019 CLINICAL DATA:  Shortness of breath. History of COVID-19. EXAM: CT ANGIOGRAPHY CHEST WITH CONTRAST TECHNIQUE: Multidetector CT imaging of the chest was performed using the standard protocol during bolus administration of intravenous contrast.  Multiplanar CT image reconstructions and MIPs were obtained to evaluate the vascular anatomy. CONTRAST:  13m OMNIPAQUE IOHEXOL  350 MG/ML SOLN COMPARISON:  None. FINDINGS: Cardiovascular: Contrast injection is sufficient to demonstrate satisfactory opacification of the pulmonary arteries to the segmental level. There is no pulmonary embolus or evidence of right heart strain. The size of the main pulmonary artery is normal. Mild cardiomegaly. There are coronary artery calcifications. The course and caliber of the aorta are normal. There is mild atherosclerotic calcification. Opacification decreased due to pulmonary arterial phase contrast bolus timing. Mediastinum/Nodes: There are multiple calcified mediastinal and hilar lymph nodes likely due to prior granulomatous infection. Lungs/Pleura: There is a small right pleural effusion. Diffuse mild septal thickening. Upper Abdomen: Contrast bolus timing is not optimized for evaluation of the abdominal organs. There is hepatic cirrhosis with splenomegaly. Musculoskeletal: No chest wall abnormality. No bony spinal canal stenosis. Review of the MIP images confirms the above findings. IMPRESSION: 1. No pulmonary embolus or acute aortic syndrome. 2. Small right pleural effusion and mild pulmonary edema. 3. Hepatic cirrhosis and splenomegaly. 4. Aortic Atherosclerosis (ICD10-I70.0). Calcific coronary artery atherosclerosis. Electronically Signed   By: Ulyses Jarred M.D.   On: 12/27/2019 01:42    EKG: Personally reviewed.  Rhythm is sinus rhythm with heart rate of 84 bpm.  No dynamic ST segment changes appreciated.  Assessment/Plan Active Problems:   Acute on chronic diastolic CHF (congestive heart failure) ([place])   Patient presenting with pillow orthopnea, paroxysmal nocturnal dyspnea, bilateral lower extremity pitting edema, increasing weight gain and increasing abdominal girth all consistent with acute on chronic diastolic congestive heart failure.  Patient admits  to consuming a high salt diet in the outpatient setting on a regular basis.  Patient patient on 80 mg of IV Lasix every 12 hours  Strict input and output monitoring  Daily weights  Monitoring renal function and electrolytes with serial chemistries  Considering profoundly increased abdominal girth I am uncertain as to how much of this is superimposed ascites.  I doubt very much but will obtain ultrasound of the abdomen to evaluate for significant ascites.  If there is indeed significant ascites then will arrange for paracentesis during this hospitalization.    Coronary artery disease involving native coronary artery of native heart without angina pectoris   Patient is currently chest pain-free  Continue home regimen of AV nodal blocking agents and statin therapy  Monitoring patient on telemetry    CKD (chronic kidney disease), stage III   Creatinine near baseline  Monitoring renal function electrolytes with serial chemistries  Strict input and output monitoring    Type 2 diabetes mellitus with hyperglycemia, without long-term current use of insulin (HCC)   Hemoglobin A1c performed in March is 5.0% suggestive of good outpatient control.  Accu-Cheks before every meal and nightly with sliding scale insulin    Asthma, mild intermittent   As needed bronchodilator therapy for bouts of shortness of breath and wheezing  Do not believe that patient is currently suffering from an asthma exacerbation at this time.    GERD without esophagitis   Continue daily PPI    OSA (obstructive sleep apnea)   CPAP ordered for nightly   Cirrhosis   Vague documented history of cirrhosis, suspected to be secondary to heavy alcohol use in the past  Considering substantial abdominal girth, I am not certain as to whether or not patient has significant ascites  Obtaining ultrasound ascites survey and will consider therapeutic paracentesis if there is indeed large-volume ascites but I  doubt it based on physical examination  Code Status:  Full code Family Communication: Deferred  Status  is: Observation  The patient remains OBS appropriate and will d/c before 2 midnights.  Dispo: The patient is from: Home              Anticipated d/c is to: Home              Anticipated d/c date is: 2 days              Patient currently is not medically stable to d/c.        Vernelle Emerald MD [facility] Pager [phone]  If 7PM-7AM, please contact night-coverage [email or web address] Use universal [place] password for that web site. If you do not have the password, please call the hospital operator.  12/27/2019, 4:11 AM

## 2019-12-27 NOTE — Progress Notes (Signed)
Offered CPAP to patient who states that he has not worn device in several years.  He admits to sleep apnea but feels he is well managed without device.  Advised will be available if he changes his mind.

## 2019-12-28 DIAGNOSIS — K7031 Alcoholic cirrhosis of liver with ascites: Secondary | ICD-10-CM

## 2019-12-28 DIAGNOSIS — K5900 Constipation, unspecified: Secondary | ICD-10-CM

## 2019-12-28 LAB — DIFFERENTIAL
Abs Immature Granulocytes: 0.01 10*3/uL (ref 0.00–0.07)
Basophils Absolute: 0 10*3/uL (ref 0.0–0.1)
Basophils Relative: 1 %
Eosinophils Absolute: 0.1 10*3/uL (ref 0.0–0.5)
Eosinophils Relative: 4 %
Immature Granulocytes: 0 %
Lymphocytes Relative: 17 %
Lymphs Abs: 0.6 10*3/uL — ABNORMAL LOW (ref 0.7–4.0)
Monocytes Absolute: 0.3 10*3/uL (ref 0.1–1.0)
Monocytes Relative: 8 %
Neutro Abs: 2.4 10*3/uL (ref 1.7–7.7)
Neutrophils Relative %: 70 %

## 2019-12-28 LAB — BASIC METABOLIC PANEL
Anion gap: 10 (ref 5–15)
BUN: 26 mg/dL — ABNORMAL HIGH (ref 8–23)
CO2: 25 mmol/L (ref 22–32)
Calcium: 8.7 mg/dL — ABNORMAL LOW (ref 8.9–10.3)
Chloride: 103 mmol/L (ref 98–111)
Creatinine, Ser: 1.31 mg/dL — ABNORMAL HIGH (ref 0.61–1.24)
GFR calc Af Amer: >60 mL/min (ref >60)
GFR calc non Af Amer: 54 mL/min — ABNORMAL LOW (ref >60)
Glucose, Bld: 106 mg/dL — ABNORMAL HIGH (ref 70–99)
Potassium: 3.7 mmol/L (ref 3.5–5.1)
Sodium: 138 mmol/L (ref 135–145)

## 2019-12-28 LAB — CBC
HCT: 27.1 % — ABNORMAL LOW (ref 39.0–52.0)
Hemoglobin: 7.2 g/dL — ABNORMAL LOW (ref 13.0–17.0)
MCH: 22.3 pg — ABNORMAL LOW (ref 26.0–34.0)
MCHC: 26.6 g/dL — ABNORMAL LOW (ref 30.0–36.0)
MCV: 83.9 fL (ref 80.0–100.0)
Platelets: 122 10*3/uL — ABNORMAL LOW (ref 150–400)
RBC: 3.23 MIL/uL — ABNORMAL LOW (ref 4.22–5.81)
RDW: 17.2 % — ABNORMAL HIGH (ref 11.5–15.5)
WBC: 3.4 10*3/uL — ABNORMAL LOW (ref 4.0–10.5)
nRBC: 0 % (ref 0.0–0.2)

## 2019-12-28 LAB — HEMOGLOBIN AND HEMATOCRIT, BLOOD
HCT: 26.1 % — ABNORMAL LOW (ref 39.0–52.0)
Hemoglobin: 7.2 g/dL — ABNORMAL LOW (ref 13.0–17.0)

## 2019-12-28 LAB — GLUCOSE, CAPILLARY
Glucose-Capillary: 121 mg/dL — ABNORMAL HIGH (ref 70–99)
Glucose-Capillary: 127 mg/dL — ABNORMAL HIGH (ref 70–99)
Glucose-Capillary: 111 mg/dL — ABNORMAL HIGH (ref 70–99)
Glucose-Capillary: 114 mg/dL — ABNORMAL HIGH (ref 70–99)

## 2019-12-28 LAB — TSH: TSH: 2.761 u[IU]/mL (ref 0.350–4.500)

## 2019-12-28 MED ORDER — POLYETHYLENE GLYCOL 3350 17 G PO PACK
17.0000 g | PACK | Freq: Every day | ORAL | Status: DC
  Administered 2019-12-28 – 2020-01-04 (×6): 17 g via ORAL
  Filled 2019-12-28 (×7): qty 1

## 2019-12-28 MED ORDER — FUROSEMIDE 10 MG/ML IJ SOLN
80.0000 mg | Freq: Once | INTRAMUSCULAR | Status: AC
  Administered 2019-12-28: 80 mg via INTRAVENOUS
  Filled 2019-12-28: qty 8

## 2019-12-28 MED ORDER — FUROSEMIDE 10 MG/ML IJ SOLN
100.0000 mg | Freq: Two times a day (BID) | INTRAVENOUS | Status: DC
  Administered 2019-12-28: 100 mg via INTRAVENOUS
  Filled 2019-12-28 (×2): qty 10

## 2019-12-28 NOTE — Progress Notes (Signed)
Pt. Not wearing CPAP at home.  Does not wish to wear at this facility.

## 2019-12-28 NOTE — Progress Notes (Signed)
Pt is getting a Neb treatment now for increased wheezing. Pt c/o while in bed with leg elevated he was having a harder time breathing and felt pressure in his chest. Once foot of bed lowered he said that made his breathing easier, VSS as noted.

## 2019-12-28 NOTE — Progress Notes (Addendum)
PROGRESS NOTE    [name]    Code Status: Full Code  ZOX:[id number] DOB: Jan 27, 1946 DOA: 12/26/2019 LOS: 1 days  PCP: [name], MD CC:  Chief Complaint  Patient presents with  . Shortness of Breath       Hospital Summary   [name] is a 74 y.o. male with a history of HFpEF, CKD 3, type 2 diabetes, cirrhosis, COVID-19 infection diagnosed 10/31/2019, CAD status post DES x2 and CABG, diabetic peripheral neuropathy, iron deficiency anemia, asthma who was admitted for increasing shortness of breath and bilateral lower extremity edema x2 weeks and admitted for HFpEF exacerbation.  Overnight 5/1, patient had increased wheeze and difficulty breathing which improved with neb treatment.     A & P   Active Problems:   GERD without esophagitis   OSA (obstructive sleep apnea)   Coronary artery disease involving native coronary artery of native heart without angina pectoris   CKD (chronic kidney disease), stage III   Acute on chronic diastolic CHF (congestive heart failure) (HCC)   Type 2 diabetes mellitus with hyperglycemia, without long-term current use of insulin (HCC)   Asthma, mild intermittent   Acute on chronic diastolic (congestive) heart failure (HCC)   Alcoholic cirrhosis of liver with ascites (HCC)   Acute exacerbation of CHF (congestive heart failure) ([place])    1. HFpEF exacerbation  a. Echo 07/2019: EF 55-60% with moderately dilated left atria and mod mitral calcification b. Wt increased since admission 108->121 kg in one day (not sure if this is accurate) c. 3-4+ bilateral lower extremity pitting edema with edema up to abdomen d. Continue I/O and daily weights e. Increase lasix to 100 mg IV BID  2. Alcoholic Cirrhosis, not in acute decompensation a. On spironolactone as outpatient b. No ascites on imaging c. Holding spironolactone due to AKI  3. AKI Likely cardio- renal  a. Increase diuresis  b. Avoid nephrotoxic agents   4. COVID-19  positive a. Off precautions as patient was diagnosed on 10/31/19 and still within 68 'positive test' window b. Discussed with infection prevention and ok to be off precautions  5. Wheeze a. Improved with neb treatment, still wheezy today b. Likely cardiac-wheeze from heart failure c. Increase Lasix d. Continue PRN nebs and anoro ellipta  6. Pancytopenia, unknown etiology a. Differential unremarkable b. Will get peripheral smear and TSH c. Repeat H/H this afternoon  7. CAD with history of DES and CABG, stable  8. Type 2 diabetes currently with sliding scale  9. Controlled mild admission asthma  10. GERD  11. Constipation 1. miralax added  12. OSA refusing CPAP  DVT prophylaxis: lovenox Family Communication: patient updated at bedside Disposition Plan:  Status is: Inpatient  Remains inpatient appropriate because:IV treatments appropriate due to intensity of illness or inability to take PO and Inpatient level of care appropriate due to severity of illness Still needs IV lasix and volume management   Dispo: The patient is from: Home              Anticipated d/c is to: Home              Anticipated d/c date is: 3 days              Patient currently is not medically stable to d/c.           Pressure injury documentation    None  Consultants  None  Procedures  None  Antibiotics   Anti-infectives (From admission,  onward)   None        Subjective   Overnight patient had increased work of breathing and wheeze which improved with neb treatment. Complains of constipation and persistent abdominal and leg swelling. No other issues or complaints.   Objective   Vitals:   12/27/19 2102 12/28/19 0100 12/28/19 0145 12/28/19 0500  BP: (!) 143/69 (!) 119/57 (!) 131/57 132/65  Pulse: 77 62 72 71  Resp: 20 16 16 16   Temp: 97.9 F (36.6 C) 98.2 F (36.8 C)  97.9 F (36.6 C)  TempSrc:  Oral  Oral  SpO2: 93% 98% 95% 94%  Weight:    121.4 kg  Height:         Intake/Output Summary (Last 24 hours) at 12/28/2019 1042 Last data filed at 12/28/2019 0932 Gross per 24 hour  Intake 1090 ml  Output 2125 ml  Net -1035 ml   Filed Weights   12/26/19 1950 12/27/19 0032 12/28/19 0500  Weight: 108.9 kg 108.6 kg 121.4 kg    Examination:  Physical Exam Vitals and nursing note reviewed.  Constitutional:      Appearance: Normal appearance. He is obese.  HENT:     Head: Normocephalic and atraumatic.  Eyes:     Conjunctiva/sclera: Conjunctivae normal.  Cardiovascular:     Rate and Rhythm: Normal rate and regular rhythm.  Pulmonary:     Effort: Pulmonary effort is normal.     Breath sounds: Examination of the right-lower field reveals rales. Rales present.  Abdominal:     General: Abdomen is flat.     Comments: Abdominal pitting edema up to about umbilicus  Musculoskeletal:        General: No swelling or tenderness.     Right lower leg: Edema present.     Left lower leg: Edema present.     Comments: Bilateral lower extremity 3-4+ pitting edema  Skin:    Coloration: Skin is not jaundiced.  Neurological:     Mental Status: He is alert. Mental status is at baseline.  Psychiatric:        Mood and Affect: Mood normal.        Behavior: Behavior normal.     Data Reviewed: I have personally reviewed following labs and imaging studies  CBC: Recent Labs  Lab 12/26/19 2004 12/28/19 0611  WBC 4.2 3.4*  NEUTROABS  --  2.4  HGB 7.5* 7.2*  HCT 27.1* 27.1*  MCV 83.4 83.9  PLT 125* 245*   Basic Metabolic Panel: Recent Labs  Lab 12/26/19 2004 12/27/19 1652 12/28/19 0611  NA 139  --  138  K 3.9  --  3.7  CL 106  --  103  CO2 23  --  25  GLUCOSE 146*  --  106*  BUN 26*  --  26*  CREATININE 1.24  --  1.31*  CALCIUM 8.7*  --  8.7*  MG  --  2.3  --    GFR: Estimated Creatinine Clearance: 63.6 mL/min (A) (by C-G formula based on SCr of 1.31 mg/dL (H)). Liver Function Tests: No results for input(s): AST, ALT, ALKPHOS, BILITOT, PROT,  ALBUMIN in the last 168 hours. No results for input(s): LIPASE, AMYLASE in the last 168 hours. No results for input(s): AMMONIA in the last 168 hours. Coagulation Profile: No results for input(s): INR, PROTIME in the last 168 hours. Cardiac Enzymes: No results for input(s): CKTOTAL, CKMB, CKMBINDEX, TROPONINI in the last 168 hours. BNP (last 3 results) No results for input(s): PROBNP in  the last 8760 hours. HbA1C: No results for input(s): HGBA1C in the last 72 hours. CBG: Recent Labs  Lab 12/27/19 0843 12/27/19 1223 12/27/19 1644 12/27/19 2102 12/28/19 0804  GLUCAP 90 125* 83 107* 121*   Lipid Profile: No results for input(s): CHOL, HDL, LDLCALC, TRIG, CHOLHDL, LDLDIRECT in the last 72 hours. Thyroid Function Tests: No results for input(s): TSH, T4TOTAL, FREET4, T3FREE, THYROIDAB in the last 72 hours. Anemia Panel: No results for input(s): VITAMINB12, FOLATE, FERRITIN, TIBC, IRON, RETICCTPCT in the last 72 hours. Sepsis Labs: No results for input(s): PROCALCITON, LATICACIDVEN in the last 168 hours.  Recent Results (from the past 240 hour(s))  Respiratory Panel by RT PCR (Flu A&B, Covid) - Nasopharyngeal Swab     Status: Abnormal   Collection Time: 12/27/19  4:22 AM   Specimen: Nasopharyngeal Swab  Result Value Ref Range Status   SARS Coronavirus 2 by RT PCR POSITIVE (A) NEGATIVE Final    Comment: RESULT CALLED TO, READ BACK BY AND VERIFIED WITH: FREAKEY,J. RN AT 3235 12/27/19 MULLINS,T (NOTE) SARS-CoV-2 target nucleic acids are DETECTED. SARS-CoV-2 RNA is generally detectable in upper respiratory specimens  during the acute phase of infection. Positive results are indicative of the presence of the identified virus, but do not rule out bacterial infection or co-infection with other pathogens not detected by the test. Clinical correlation with patient history and other diagnostic information is necessary to determine patient infection status. The expected result is  Negative. Fact Sheet for Patients:  PinkCheek.be Fact Sheet for Healthcare Providers: GravelBags.it This test is not yet approved or cleared by the [place] FDA and  has been authorized for detection and/or diagnosis of SARS-CoV-2 by FDA under an Emergency Use Authorization (EUA).  This EUA will remain in effect (meaning this test can be [place] ed) for the duration of  the COVID-19 declaration under Section 564(b)(1) of the Act, 21 U.S.C. section 360bbb-3(b)(1), unless the authorization is terminated or revoked sooner.    Influenza A by PCR NEGATIVE NEGATIVE Final   Influenza B by PCR NEGATIVE NEGATIVE Final    Comment: (NOTE) The Xpert Xpress SARS-CoV-2/FLU/RSV assay is intended as an aid in  the diagnosis of influenza from Nasopharyngeal swab specimens and  should not be used as a sole basis for treatment. Nasal washings and  aspirates are unacceptable for Xpert Xpress SARS-CoV-2/FLU/RSV  testing. Fact Sheet for Patients: PinkCheek.be Fact Sheet for Healthcare Providers: GravelBags.it This test is not yet approved or cleared by the [place] FDA and  has been authorized for detection and/or diagnosis of SARS-CoV-2 by  FDA under an Emergency Use Authorization (EUA). This EUA will remain  in effect (meaning this test can be used) for the duration of the  Covid-19 declaration under Section 564(b)(1) of the Act, 21  U.S.C. section 360bbb-3(b)(1), unless the authorization is  terminated or revoked. Performed at [facility], [place] [place] [address] [place], [place] [address]          Radiology Studies: DG Chest 2 View  Result Date: 12/26/2019 CLINICAL DATA:  Shortness of breath EXAM: CHEST - 2 VIEW COMPARISON:  10/31/2019 FINDINGS: Cardiac shadow is mildly enlarged but stable. Postsurgical changes are noted. Lungs are well aerated bilaterally. Mild  vascular congestion again identified. Some slightly more prominent density is noted lateral aspect of the right in the minor fissure. This may represent some developing infiltrate. No other focal abnormality is noted. IMPRESSION: Changes consistent with mild vascular congestion. Some developing infiltrate in the right mid  lung is noted. This may be related to the patient's known COVID status. Electronically Signed   By: Inez Catalina M.D.   On: 12/26/2019 21:07   CT Angio Chest PE W/Cm &/Or Wo Cm  Result Date: 12/27/2019 CLINICAL DATA:  Shortness of breath. History of COVID-19. EXAM: CT ANGIOGRAPHY CHEST WITH CONTRAST TECHNIQUE: Multidetector CT imaging of the chest was performed using the standard protocol during bolus administration of intravenous contrast. Multiplanar CT image reconstructions and MIPs were obtained to evaluate the vascular anatomy. CONTRAST:  121mL OMNIPAQUE IOHEXOL 350 MG/ML SOLN COMPARISON:  None. FINDINGS: Cardiovascular: Contrast injection is sufficient to demonstrate satisfactory opacification of the pulmonary arteries to the segmental level. There is no pulmonary embolus or evidence of right heart strain. The size of the main pulmonary artery is normal. Mild cardiomegaly. There are coronary artery calcifications. The course and caliber of the aorta are normal. There is mild atherosclerotic calcification. Opacification decreased due to pulmonary arterial phase contrast bolus timing. Mediastinum/Nodes: There are multiple calcified mediastinal and hilar lymph nodes likely due to prior granulomatous infection. Lungs/Pleura: There is a small right pleural effusion. Diffuse mild septal thickening. Upper Abdomen: Contrast bolus timing is not optimized for evaluation of the abdominal organs. There is hepatic cirrhosis with splenomegaly. Musculoskeletal: No chest wall abnormality. No bony spinal canal stenosis. Review of the MIP images confirms the above findings. IMPRESSION: 1. No pulmonary  embolus or acute aortic syndrome. 2. Small right pleural effusion and mild pulmonary edema. 3. Hepatic cirrhosis and splenomegaly. 4. Aortic Atherosclerosis (ICD10-I70.0). Calcific coronary artery atherosclerosis. Electronically Signed   By: Ulyses Jarred M.D.   On: 12/27/2019 01:42   [place] ASCITES (ABDOMEN LIMITED)  Result Date: 12/27/2019 CLINICAL DATA:  Ascites search.  Cirrhotic findings by CT EXAM: LIMITED ABDOMEN ULTRASOUND FOR ASCITES TECHNIQUE: Limited ultrasound survey for ascites was performed in all four abdominal quadrants. COMPARISON:  Chest CT from earlier today FINDINGS: No visible ascites by 4 quadrant search.  No unexpected finding. IMPRESSION: Negative for ascites. Electronically Signed   By: Monte Fantasia M.D.   On: 12/27/2019 05:05        Scheduled Meds: . enoxaparin (LOVENOX) injection  40 mg Subcutaneous Q24H  . gabapentin  100 mg Oral BID  . insulin aspart  0-15 Units Subcutaneous TID AC & HS  . polyethylene glycol  17 g Oral Daily  . sodium chloride flush  3 mL Intravenous Q12H  . umeclidinium-vilanterol  1 puff Inhalation Daily   Continuous Infusions: . sodium chloride    . furosemide       Time spent: 30 minutes with over 50% of the time coordinating the patient's care    Harold Hedge, DO [facility] Pager [phone]  Call night coverage person covering after 7pm

## 2019-12-29 ENCOUNTER — Encounter (HOSPITAL_COMMUNITY): Payer: Self-pay | Admitting: Internal Medicine

## 2019-12-29 DIAGNOSIS — I5033 Acute on chronic diastolic (congestive) heart failure: Secondary | ICD-10-CM | POA: Diagnosis not present

## 2019-12-29 DIAGNOSIS — D61818 Other pancytopenia: Secondary | ICD-10-CM

## 2019-12-29 LAB — CBC
HCT: 25.5 % — ABNORMAL LOW (ref 39.0–52.0)
Hemoglobin: 7 g/dL — ABNORMAL LOW (ref 13.0–17.0)
MCH: 22.9 pg — ABNORMAL LOW (ref 26.0–34.0)
MCHC: 27.5 g/dL — ABNORMAL LOW (ref 30.0–36.0)
MCV: 83.3 fL (ref 80.0–100.0)
Platelets: 114 10*3/uL — ABNORMAL LOW (ref 150–400)
RBC: 3.06 MIL/uL — ABNORMAL LOW (ref 4.22–5.81)
RDW: 17.1 % — ABNORMAL HIGH (ref 11.5–15.5)
WBC: 3.1 10*3/uL — ABNORMAL LOW (ref 4.0–10.5)
nRBC: 0 % (ref 0.0–0.2)

## 2019-12-29 LAB — GLUCOSE, CAPILLARY
Glucose-Capillary: 109 mg/dL — ABNORMAL HIGH (ref 70–99)
Glucose-Capillary: 167 mg/dL — ABNORMAL HIGH (ref 70–99)
Glucose-Capillary: 125 mg/dL — ABNORMAL HIGH (ref 70–99)
Glucose-Capillary: 137 mg/dL — ABNORMAL HIGH (ref 70–99)

## 2019-12-29 LAB — BASIC METABOLIC PANEL
Anion gap: 7 (ref 5–15)
BUN: 26 mg/dL — ABNORMAL HIGH (ref 8–23)
CO2: 26 mmol/L (ref 22–32)
Calcium: 8.3 mg/dL — ABNORMAL LOW (ref 8.9–10.3)
Chloride: 102 mmol/L (ref 98–111)
Creatinine, Ser: 1.22 mg/dL (ref 0.61–1.24)
GFR calc Af Amer: >60 mL/min (ref >60)
GFR calc non Af Amer: 58 mL/min — ABNORMAL LOW (ref >60)
Glucose, Bld: 137 mg/dL — ABNORMAL HIGH (ref 70–99)
Potassium: 3.8 mmol/L (ref 3.5–5.1)
Sodium: 135 mmol/L (ref 135–145)

## 2019-12-29 LAB — MAGNESIUM: Magnesium: 2.2 mg/dL (ref 1.7–2.4)

## 2019-12-29 LAB — HEMOGLOBIN AND HEMATOCRIT, BLOOD
HCT: 27.7 % — ABNORMAL LOW (ref 39.0–52.0)
Hemoglobin: 7.7 g/dL — ABNORMAL LOW (ref 13.0–17.0)

## 2019-12-29 LAB — HIV ANTIBODY (ROUTINE TESTING W REFLEX): HIV Screen 4th Generation wRfx: NONREACTIVE

## 2019-12-29 MED ORDER — DOCUSATE SODIUM 100 MG PO CAPS
100.0000 mg | ORAL_CAPSULE | Freq: Every day | ORAL | Status: DC
  Administered 2019-12-29 – 2020-01-04 (×7): 100 mg via ORAL
  Filled 2019-12-29 (×7): qty 1

## 2019-12-29 MED ORDER — ALBUTEROL SULFATE HFA 108 (90 BASE) MCG/ACT IN AERS
2.0000 | INHALATION_SPRAY | Freq: Four times a day (QID) | RESPIRATORY_TRACT | Status: DC | PRN
  Administered 2019-12-29: 2 via RESPIRATORY_TRACT

## 2019-12-29 MED ORDER — FUROSEMIDE 10 MG/ML IJ SOLN
120.0000 mg | Freq: Two times a day (BID) | INTRAVENOUS | Status: DC
  Administered 2019-12-29 – 2020-01-03 (×11): 120 mg via INTRAVENOUS
  Filled 2019-12-29 (×2): qty 12
  Filled 2019-12-29: qty 10
  Filled 2019-12-29: qty 12
  Filled 2019-12-29 (×3): qty 10
  Filled 2019-12-29 (×2): qty 12
  Filled 2019-12-29: qty 10
  Filled 2019-12-29: qty 12
  Filled 2019-12-29: qty 10

## 2019-12-29 NOTE — Consult Note (Signed)
 CARDIOLOGY CONSULT NOTE  Patient ID: [name] MRN: [id number] DOB/AGE: 02/25/1946 74 y.o.  Admit date: 12/26/2019 Primary Physician Crawford, Elizabeth A, MD Primary Cardiologist Philip Nahser, MD Chief Complaint     Dyspnea Requesting  Dr. Segal   HPI:   The patient presents with acute dyspnea.  He has a history of CAD/CABG.  (Stent x 2 2013 and CABG 2015).   He has had diastolic HF.  He had COVID 19 in March.  However, he is not convinced he did not have a really severe illness that he recalls.  He presented to the hospital after his son insisted.  He had increased lower extremity swelling.  He has had some increased dyspnea with exertion although this is not the biggest complaint.  He does not weigh himself every day and he does not know what his "dry" weight is.  However, looking back in the chart he is at least 30 to 40 pounds over his lowest recent recorded weight.  He clearly does not watch his salt.  He has not been having any chest pressure, neck or arm discomfort.  Is not been having any palpitations, presyncope or syncope.  He does do some walking and actually was able to go fishing a few days ago.  On presentation to the ED CT was negative for PE.  Chest x-rays as below.  He was treated with IV Lasix.    Of note he had an echocardiogram in December with an EF of 60%.  He has severe calcification of the aortic valve.  He has moderate thickening of the leaflets.  However, there was no significant stenosis.  Past Medical History:  Diagnosis Date  . Arthritis    "joints tighten up sometimes" (03/27/2104)  . Asthma   . CHF (congestive heart failure) (HCC)   . Chronic low back pain   . Chronic lower back pain   . Coronary artery disease    a. 05/2012 Cath/PCI: LM 40ost, 50-60d, LAD 99p ruptured plaque (3.0x28 DES), LCX 50p/m, RCA 30-40p, 50m, EF 65-70%.  . Coronary artery disease involving native coronary artery of native heart without angina pectoris    Severe left main  disease at catheterization July 2015  CABG x3 with a LIMA to the LAD, SVG to the OM, SVG to the PDA on 03/31/14. EF 60% by cath.   . Family history of adverse reaction to anesthesia    Pt granddaughter had PONV  . GERD (gastroesophageal reflux disease)   . GERD without esophagitis 08/04/2010  . Hepatic cirrhosis (HCC)    a. Dx 01/2014 - CT a/p   . History of blood transfusion    "related to bleeding ulcers"  . History of concussion    1976--  NO RESIDUAL  . History of GI bleed    a. UGIB 07/2012;  b. 01/2014 admission with GIB/FOB stool req 1U prbc's->EGD showed portal gastropathy, barrett's esoph, and chronic active h. pylori gastritis.  . History of gout    2007 &  2008  LEFT LEG-- NO ISSUE SINCE  . Hyperlipidemia   . Iron deficiency anemia   . Kidney stones   . Lipoma   . OA (osteoarthritis of spine)    LOWER BACK--  INTERMITTANT LEFT LEG NUMBNESS  . OSA (obstructive sleep apnea)    PULMOLOGIST-  DR CLANCE--  MODERATE OSA  STARTED CPAP 2012--  BUT CURRENTLY HAS NOT USED PAST 6 MONTHS  . Phimosis    a. s/p circumcision 2015.  .   Type 2 diabetes mellitus ([place])   . Unspecified essential hypertension     Past Surgical History:  Procedure Laterality Date  . ANKLE FRACTURE SURGERY Right 1989   "plate put in"  . APPENDECTOMY  05-16-2004   open  . CIRCUMCISION N/A 09/09/2013   Procedure: CIRCUMCISION ADULT;  Surgeon: Bernestine Amass, MD;  Location: [facility];  Service: Urology;  Laterality: N/A;  . COLECTOMY  05-16-2004  . CORONARY ANGIOPLASTY WITH STENT PLACEMENT  06/28/2012  DR COOPER   PCI W/  X1 DES to [place]. LAD/  LM  40% OSTIAL & 50-60% DISTAL /  50% PROX LCX/  30-40% PROX RCA & 50% MID RCA/   LVEF 65-70%  . CORONARY ARTERY BYPASS GRAFT N/A 03/31/2014   Procedure: CORONARY ARTERY BYPASS GRAFTING (CABG) times 3 using left internal mammary artery and right saphenous vein.;  Surgeon: Melrose Nakayama, MD;  Location: [place];  Service: Open Heart Surgery;  Laterality:  N/A;  . DOBUTAMINE STRESS ECHO  06-08-2012   MODERATE HYPOKINESIS/ ISCHEMIA MID INFERIOR WALL  . ESOPHAGOGASTRODUODENOSCOPY  08/15/2012   Procedure: ESOPHAGOGASTRODUODENOSCOPY (EGD);  Surgeon: Wonda Horner, MD;  Location: [facility] ENDOSCOPY;  Service: Endoscopy;  Laterality: N/A;  . ESOPHAGOGASTRODUODENOSCOPY N/A 02/17/2014   Procedure: ESOPHAGOGASTRODUODENOSCOPY (EGD);  Surgeon: Jeryl Columbia, MD;  Location: [facility] ENDOSCOPY;  Service: Endoscopy;  Laterality: N/A;  . ESOPHAGOGASTRODUODENOSCOPY (EGD) WITH PROPOFOL N/A 09/30/2017   Procedure: ESOPHAGOGASTRODUODENOSCOPY (EGD) WITH PROPOFOL;  Surgeon: Wonda Horner, MD;  Location: [facility] ENDOSCOPY;  Service: Endoscopy;  Laterality: N/A;  . ESOPHAGOGASTRODUODENOSCOPY (EGD) WITH PROPOFOL N/A 12/20/2017   Procedure: ESOPHAGOGASTRODUODENOSCOPY (EGD) WITH PROPOFOL;  Surgeon: Clarene Essex, MD;  Location: [place];  Service: Endoscopy;  Laterality: N/A;  . HERNIA REPAIR    . INTRAOPERATIVE TRANSESOPHAGEAL ECHOCARDIOGRAM N/A 03/31/2014   Procedure: INTRAOPERATIVE TRANSESOPHAGEAL ECHOCARDIOGRAM;  Surgeon: Melrose Nakayama, MD;  Location: [place];  Service: Open Heart Surgery;  Laterality: N/A;  . LAPAROSCOPIC UMBILICAL HERNIA REPAIR W/ MESH  06-06-2011  . LEFT HEART CATHETERIZATION WITH CORONARY ANGIOGRAM N/A 03/28/2014   Procedure: LEFT HEART CATHETERIZATION WITH CORONARY ANGIOGRAM;  Surgeon: Sinclair Grooms, MD;  Location: [facility] CATH LAB;  Service: Cardiovascular;  Laterality: N/A;  . LIPOMA EXCISION Left 08/25/2017   Procedure: EXCISION LIPOMA LEFT POSTERIOR THIGH;  Surgeon: Clovis Riley, MD;  Location: [place];  Service: General;  Laterality: Left;  . NEPHROLITHOTOMY  1990'S  . OPEN APPENDECTOMY W/ PARTIAL CECECTOMY  05-16-2004  . PERCUTANEOUS CORONARY STENT INTERVENTION (PCI-S) N/A 06/28/2012   Procedure: PERCUTANEOUS CORONARY STENT INTERVENTION (PCI-S);  Surgeon: Sherren Mocha, MD;  Location: [facility] CATH LAB;  Service: Cardiovascular;  Laterality: N/A;    No Known  Allergies Medications Prior to Admission  Medication Sig Dispense Refill Last Dose  . albuterol (VENTOLIN HFA) 108 (90 Base) MCG/ACT inhaler Inhale 2 puffs into the lungs every 6 (six) hours as needed for wheezing or shortness of breath.   Past Month at Unknown time  . ferrous sulfate 325 (65 FE) MG EC tablet Take 1 tablet (325 mg total) by mouth daily with breakfast. (Patient taking differently: Take 325 mg by mouth daily with supper. ) 90 tablet 3 12/26/2019 at Unknown time  . furosemide (LASIX) 80 MG tablet Take 80 mg by mouth daily.   12/26/2019 at Unknown time  . gabapentin (NEURONTIN) 100 MG capsule TAKE 1 CAPSULE(100 MG) BY MOUTH TWICE DAILY (Patient taking differently: Take 100 mg by mouth 2 (two) times daily. ) 180 capsule 3  12/26/2019 at Unknown time  . glimepiride (AMARYL) 2 MG tablet TAKE 1 TABLET(2 MG) BY MOUTH DAILY BEFORE BREAKFAST (Patient taking differently: Take 2 mg by mouth daily with breakfast. ) 30 tablet 5 12/26/2019 at Unknown time  . metFORMIN (GLUCOPHAGE) 850 MG tablet Take 1 tablet (850 mg total) by mouth 2 (two) times daily with a meal. 180 tablet 3 12/26/2019 at Unknown time  . nitroGLYCERIN (NITROSTAT) 0.4 MG SL tablet Place 1 tablet (0.4 mg total) under the tongue every 5 (five) minutes as needed for chest pain. 25 tablet 6 not used  . umeclidinium-vilanterol (ANORO ELLIPTA) 62.5-25 MCG/INH AEPB Inhale 1 puff into the lungs daily. 30 each 11 12/26/2019 at Unknown time  . blood glucose meter kit and supplies Dispense based on patient and insurance preference. Use up to four times daily as directed. (FOR ICD-10 E10.9, E11.9). 1 each 0   . spironolactone (ALDACTONE) 50 MG tablet Take 1 tablet (50 mg total) by mouth daily. (Patient not taking: Reported on 10/31/2019) 90 tablet 3    Family History  Problem Relation Age of Onset  . Lung cancer Sister   . Cancer Sister        lung  . Cancer Mother   . Cancer Father        died in his 70s.  . Cancer Brother        lung  .  Coronary artery disease Other   . Diabetes Other   . Colon cancer Other   . Cancer Sister        lung    Social History   Socioeconomic History  . Marital status: Married    Spouse name: Not on file  . Number of children: Not on file  . Years of education: Not on file  . Highest education level: Not on file  Occupational History  . Not on file  Tobacco Use  . Smoking status: Former Smoker    Packs/day: 2.00    Years: 40.00    Pack years: 80.00    Types: Cigarettes    Quit date: 08/29/1996    Years since quitting: 23.3  . Smokeless tobacco: Never Used  Substance and Sexual Activity  . Alcohol use: Yes    Alcohol/week: 8.0 standard drinks    Types: 8 Cans of beer per week    Comment: 2 beers every other day  . Drug use: No  . Sexual activity: Yes  Other Topics Concern  . Not on file  Social History Narrative   Lives in [place] with wife, son, and son's family.   Social Determinants of Health   Financial Resource Strain:   . Difficulty of Paying Living Expenses:   Food Insecurity:   . Worried About Running Out of Food in the Last Year:   . Ran Out of Food in the Last Year:   Transportation Needs:   . Lack of Transportation (Medical):   . Lack of Transportation (Non-Medical):   Physical Activity:   . Days of Exercise per Week:   . Minutes of Exercise per Session:   Stress:   . Feeling of Stress :   Social Connections:   . Frequency of Communication with Friends and Family:   . Frequency of Social Gatherings with Friends and Family:   . Attends Religious Services:   . Active Member of Clubs or Organizations:   . Attends Club or Organization Meetings:   . Marital Status:   Intimate Partner Violence:   . Fear of   Current or Ex-Partner:   . Emotionally Abused:   . Physically Abused:   . Sexually Abused:      ROS:    As stated in the HPI and negative for all other systems.  Physical Exam: Blood pressure 127/64, pulse 67, temperature 97.9 F (36.6 C), temperature  source Oral, resp. rate 20, height 5' 8" (1.727 m), weight 126.3 kg, SpO2 95 %.  GENERAL:  Well appearing HEENT:  Pupils equal round and reactive, fundi not visualized, oral mucosa unremarkable NECK:  No jugular venous distention, waveform within normal limits, carotid upstroke brisk and symmetric, no bruits, no thyromegaly LYMPHATICS:  No cervical, inguinal adenopathy LUNGS: Decreased breath sounds bilaterally with scattered wheezes BACK:  No CVA tenderness CHEST:  Unremarkable HEART:  PMI not displaced or sustained,S1 and S2 within normal limits, no S3, no S4, no clicks, no rubs, distant heart sounds, I do not appreciate a murmur murmurs ABD:  Flat, positive bowel sounds normal in frequency in pitch, no bruits, no rebound, no guarding, no midline pulsatile mass, no hepatomegaly, no splenomegaly, morbidly obese EXT:  2 plus pulses throughout, severe lower extremity edema extending to the abdomen edema, no cyanosis no clubbing SKIN:  No rashes no nodules NEURO:  Cranial nerves II through XII grossly intact, motor grossly intact throughout PSYCH:  Cognitively intact, oriented to person place and time   Labs: Lab Results  Component Value Date   BUN 26 (H) 12/29/2019   Lab Results  Component Value Date   CREATININE 1.22 12/29/2019   Lab Results  Component Value Date   NA 135 12/29/2019   K 3.8 12/29/2019   CL 102 12/29/2019   CO2 26 12/29/2019   Lab Results  Component Value Date   TROPONINI <0.03 12/24/2018   Lab Results  Component Value Date   WBC 3.1 (L) 12/29/2019   HGB 7.0 (L) 12/29/2019   HCT 25.5 (L) 12/29/2019   MCV 83.3 12/29/2019   PLT 114 (L) 12/29/2019   Lab Results  Component Value Date   CHOL 150 11/01/2019   HDL 38 (L) 11/01/2019   LDLCALC 98 11/01/2019   TRIG 69 11/01/2019   CHOLHDL 3.9 11/01/2019   Lab Results  Component Value Date   ALT 17 10/31/2019   AST 24 10/31/2019   ALKPHOS 106 10/31/2019   BILITOT 0.9 10/31/2019      Radiology:   CXR:  Changes consistent with mild vascular congestion. Some developing infiltrate in the right mid lung is noted. This may be related to the patient's known COVID status.  EKG:   NSR, rate 84, axis rightward.  No acute ST T wave changes.    ASSESSMENT AND PLAN:   ACUTE ON CHRONIC DIASTOLIC HF:   Net negative 3.3 liters this admission.  Massive volume overload.  He will need several days of diuresis.  I talked with his nurse.  We are going to try to get knee-high compression stockings on him or wrap his legs.  He developed acute dyspnea.  I agree with current dosing of Lasix would have a low threshold for return to the Lasix drip.  I think education is going to be key in close follow-up.  CAD/CABG: He is not having any active angina.  No further work-up.  DM: Last A1c was actually 5.0.  Therapy per primary team. Lab Results  Component Value Date   HGBA1C 5.0 11/01/2019   AKI:  Creat seems to be tolerating the diuresis.  Follow creatinine closely  ANEMIA: He has   a chronic anemia and his baseline hemoglobin appears to be slightly above 8.  Defer to primary team.  SLEEP APNEA:  Does not wear CPAP at home and does not want it now.     Signed:   12/29/2019, 9:12 AM     

## 2019-12-29 NOTE — Progress Notes (Addendum)
PROGRESS NOTE    DAVEN MONTZ    Code Status: Full Code  VHQ:[id number] DOB: 02-19-1946 DOA: 12/26/2019 LOS: 2 days  PCP: [name], MD CC:  Chief Complaint  Patient presents with  . Shortness of Breath       Hospital Summary   [name] is a 74 y.o. male with a history of HFpEF, CKD 3, type 2 diabetes, cirrhosis, COVID-19 infection diagnosed 10/31/2019, CAD status post DES x2 and CABG, diabetic peripheral neuropathy, iron deficiency anemia, asthma who was admitted for increasing shortness of breath and bilateral lower extremity edema x2 weeks and admitted for HFpEF exacerbation.  Overnight 5/1, patient had increased wheeze and difficulty breathing which improved with neb treatment.   5/2: Volume overload increasing despite increased lasix. Echo ordered, Cardiology consulted.   A & P   Active Problems:   GERD without esophagitis   OSA (obstructive sleep apnea)   Coronary artery disease involving native coronary artery of native heart without angina pectoris   CKD (chronic kidney disease), stage III   Acute on chronic diastolic CHF (congestive heart failure) (HCC)   Type 2 diabetes mellitus with hyperglycemia, without long-term current use of insulin (HCC)   Asthma, mild intermittent   Acute on chronic diastolic (congestive) heart failure (HCC)   Alcoholic cirrhosis of liver with ascites (HCC)   Acute exacerbation of CHF (congestive heart failure) ([place])    1. Acute on chronic HFpEF exacerbation  a. Echo 07/2019: EF 55-60% with moderately dilated left atria and mod mitral calcification b. Wt increased since admission 108->121->126 kg though he is down 3.3 L since admit c. Has pitting edema of abdomen almost up to chest d. Continue I/O and daily weights e. Increase lasix to 120 mg IV BID f. Low sodium diet, fluid restriction g. Cardiology consulted: knee high compression stockings, patient will need several days of IV diuresis and may need Lasix drip h. I  wonder if he may need addition of metolazone or ultrafiltration if his volume status continues to worsen  2. Alcoholic Cirrhosis, not in acute decompensation a. On spironolactone as outpatient b. No ascites on imaging c. Holding spironolactone due to AKI  3. AKI Likely cardio- renal which is improving with diuresis a. Increase diuresis  b. Avoid nephrotoxic agents   4. COVID-19 positive a. Off precautions as patient was diagnosed on 10/31/19 and still within 75 'positive test' window b. Discussed with infection prevention and ok to be off precautions  5. Wheeze Likely cardiac-wheeze from heart failure a. Improved with neb treatment, still wheezy today b. Increase Lasix c. Continue PRN nebs and anoro ellipta  6. Acute on chronic anemia with history of PUD, chronic blood loss anemia and gastric angioectasias a. EGD 12/20/17 with 5 nonbleeding NGO ectasias of the stomach treated with argon plasma coagulation and nonbleeding duodenal diverticula with Dr. Watt Climes [facility]) b. Patient was scheduled for a colonoscopy following his 2019 EGD but this was cancelled and was lost to follow up c. Hb slowly dropping since admission 7.5->7.2-> 7.0 this morning  d. Patient has consented to blood transfusion however I have chosen to hold off on PRBC transfusion at this time since the patient has massive volume overload and is asymptomatic from an anemia standpoint e. Trend H/H and if it drops below 7.0 or he is symptomatic then will transfuse f. FOBT - if positive, will consult GI   7. Pancytopenia, unknown etiology possibly from cirrhosis a. Will get peripheral smear and TSH b.  HCV screen negative in 2017 c. Will check HIV screen d. If this continues to worsen then will ask Heme/Onc to see the patient  8. CAD with history of DES and CABG, stable  9. Type 2 diabetes currently with sliding scale  10. Controlled mild admission asthma  11. GERD  12. Constipation 1. miralax and colace  13. OSA  refusing CPAP  DVT prophylaxis: lovenox Family Communication: patient updated at bedside Disposition Plan:  Status is: Inpatient  Remains inpatient appropriate because:IV treatments appropriate due to intensity of illness or inability to take PO and Inpatient level of care appropriate due to severity of illness Still needs IV lasix and volume management   Dispo: The patient is from: Home              Anticipated d/c is to: Home              Anticipated d/c date is: > 3 days              Patient currently is not medically stable to d/c.   Pressure injury documentation    None  Consultants  Cardiology  Procedures  None  Antibiotics   Anti-infectives (From admission, onward)   None        Subjective   Refusing CPAP overnight. Reports some improvement in shortness of breath but admits to persistent swelling. Patient denies any other complaints.   Objective   Vitals:   12/28/19 2024 12/29/19 0615 12/29/19 0846 12/29/19 0852  BP: 136/67 127/64    Pulse: 68 67    Resp: 20 20    Temp: 98 F (36.7 C) 97.9 F (36.6 C)    TempSrc: Oral Oral    SpO2: 98% 95% 95% 95%  Weight:  126.3 kg    Height:        Intake/Output Summary (Last 24 hours) at 12/29/2019 1150 Last data filed at 12/29/2019 0900 Gross per 24 hour  Intake 1010 ml  Output 2100 ml  Net -1090 ml   Filed Weights   12/27/19 0032 12/28/19 0500 12/29/19 0615  Weight: 108.6 kg 121.4 kg 126.3 kg    Examination:  Physical Exam Vitals and nursing note reviewed.  Constitutional:      Appearance: He is obese.  HENT:     Head: Normocephalic.  Cardiovascular:     Rate and Rhythm: Normal rate and regular rhythm.  Pulmonary:     Comments: Conversational dyspnea at rest Diffuse end expiratory wheeze noted Abdominal:     Comments: Pitting edema over abdomen above umbilicus  Musculoskeletal:     Right lower leg: Edema present.     Left lower leg: Edema present.     Comments: 3-4+ lower extremity edema    Neurological:     General: No focal deficit present.     Mental Status: He is alert.  Psychiatric:        Mood and Affect: Mood normal.        Behavior: Behavior normal.     Data Reviewed: I have personally reviewed following labs and imaging studies  CBC: Recent Labs  Lab 12/26/19 2004 12/28/19 0611 12/28/19 1327 12/29/19 0506 12/29/19 1046  WBC 4.2 3.4*  --  3.1*  --   NEUTROABS  --  2.4  --   --   --   HGB 7.5* 7.2* 7.2* 7.0* 7.7*  HCT 27.1* 27.1* 26.1* 25.5* 27.7*  MCV 83.4 83.9  --  83.3  --   PLT 125* 122*  --  114*  --    Basic Metabolic Panel: Recent Labs  Lab 12/26/19 2004 12/27/19 1652 12/28/19 0611 12/29/19 0506  NA 139  --  138 135  K 3.9  --  3.7 3.8  CL 106  --  103 102  CO2 23  --  25 26  GLUCOSE 146*  --  106* 137*  BUN 26*  --  26* 26*  CREATININE 1.24  --  1.31* 1.22  CALCIUM 8.7*  --  8.7* 8.3*  MG  --  2.3  --  2.2   GFR: Estimated Creatinine Clearance: 69.9 mL/min (by C-G formula based on SCr of 1.22 mg/dL). Liver Function Tests: No results for input(s): AST, ALT, ALKPHOS, BILITOT, PROT, ALBUMIN in the last 168 hours. No results for input(s): LIPASE, AMYLASE in the last 168 hours. No results for input(s): AMMONIA in the last 168 hours. Coagulation Profile: No results for input(s): INR, PROTIME in the last 168 hours. Cardiac Enzymes: No results for input(s): CKTOTAL, CKMB, CKMBINDEX, TROPONINI in the last 168 hours. BNP (last 3 results) No results for input(s): PROBNP in the last 8760 hours. HbA1C: No results for input(s): HGBA1C in the last 72 hours. CBG: Recent Labs  Lab 12/28/19 1143 12/28/19 1624 12/28/19 2137 12/29/19 0719 12/29/19 1127  GLUCAP 127* 111* 114* 109* 167*   Lipid Profile: No results for input(s): CHOL, HDL, LDLCALC, TRIG, CHOLHDL, LDLDIRECT in the last 72 hours. Thyroid Function Tests: Recent Labs    12/28/19 1112  TSH 2.761   Anemia Panel: No results for input(s): VITAMINB12, FOLATE, FERRITIN, TIBC,  IRON, RETICCTPCT in the last 72 hours. Sepsis Labs: No results for input(s): PROCALCITON, LATICACIDVEN in the last 168 hours.  Recent Results (from the past 240 hour(s))  Respiratory Panel by RT PCR (Flu A&B, Covid) - Nasopharyngeal Swab     Status: Abnormal   Collection Time: 12/27/19  4:22 AM   Specimen: Nasopharyngeal Swab  Result Value Ref Range Status   SARS Coronavirus 2 by RT PCR POSITIVE (A) NEGATIVE Final    Comment: RESULT CALLED TO, READ BACK BY AND VERIFIED WITH: FREAKEY,J. RN AT 4481 12/27/19 MULLINS,T (NOTE) SARS-CoV-2 target nucleic acids are DETECTED. SARS-CoV-2 RNA is generally detectable in upper respiratory specimens  during the acute phase of infection. Positive results are indicative of the presence of the identified virus, but do not rule out bacterial infection or co-infection with other pathogens not detected by the test. Clinical correlation with patient history and other diagnostic information is necessary to determine patient infection status. The expected result is Negative. Fact Sheet for Patients:  PinkCheek.be Fact Sheet for Healthcare Providers: GravelBags.it This test is not yet approved or cleared by the [place] FDA and  has been authorized for detection and/or diagnosis of SARS-CoV-2 by FDA under an Emergency Use Authorization (EUA).  This EUA will remain in effect (meaning this test can be [place] ed) for the duration of  the COVID-19 declaration under Section 564(b)(1) of the Act, 21 U.S.C. section 360bbb-3(b)(1), unless the authorization is terminated or revoked sooner.    Influenza A by PCR NEGATIVE NEGATIVE Final   Influenza B by PCR NEGATIVE NEGATIVE Final    Comment: (NOTE) The Xpert Xpress SARS-CoV-2/FLU/RSV assay is intended as an aid in  the diagnosis of influenza from Nasopharyngeal swab specimens and  should not be used as a sole basis for treatment. Nasal washings and    aspirates are unacceptable for Xpert Xpress SARS-CoV-2/FLU/RSV  testing. Fact Sheet for Patients: PinkCheek.be Fact  Sheet for Healthcare Providers: GravelBags.it This test is not yet approved or cleared by the [place] and  has been authorized for detection and/or diagnosis of SARS-CoV-2 by  FDA under an Emergency Use Authorization (EUA). This EUA will remain  in effect (meaning this test can be used) for the duration of the  Covid-19 declaration under Section 564(b)(1) of the Act, 21  U.S.C. section 360bbb-3(b)(1), unless the authorization is  terminated or revoked. Performed at [facility], [place] [address] [place], [place] [address]          Radiology Studies: No results found.      Scheduled Meds: . docusate sodium  100 mg Oral Daily  . enoxaparin (LOVENOX) injection  40 mg Subcutaneous Q24H  . gabapentin  100 mg Oral BID  . insulin aspart  0-15 Units Subcutaneous TID AC & HS  . polyethylene glycol  17 g Oral Daily  . sodium chloride flush  3 mL Intravenous Q12H  . umeclidinium-vilanterol  1 puff Inhalation Daily   Continuous Infusions: . sodium chloride    . furosemide 120 mg (12/29/19 0824)     Time spent: 37 minutes with over 50% of the time coordinating the patient's care    Harold Hedge, DO [facility] Pager [phone]  Call night coverage person covering after 7pm

## 2019-12-29 NOTE — Progress Notes (Signed)
CPAP not being utilized by patient who stated that he does not wear at home.  Will continue to monitor.

## 2019-12-30 DIAGNOSIS — I2510 Atherosclerotic heart disease of native coronary artery without angina pectoris: Secondary | ICD-10-CM

## 2019-12-30 DIAGNOSIS — N1831 Chronic kidney disease, stage 3a: Secondary | ICD-10-CM | POA: Diagnosis not present

## 2019-12-30 DIAGNOSIS — I5033 Acute on chronic diastolic (congestive) heart failure: Secondary | ICD-10-CM | POA: Diagnosis not present

## 2019-12-30 LAB — MAGNESIUM: Magnesium: 2.3 mg/dL (ref 1.7–2.4)

## 2019-12-30 LAB — BASIC METABOLIC PANEL
Anion gap: 7 (ref 5–15)
BUN: 26 mg/dL — ABNORMAL HIGH (ref 8–23)
CO2: 27 mmol/L (ref 22–32)
Calcium: 8.4 mg/dL — ABNORMAL LOW (ref 8.9–10.3)
Chloride: 103 mmol/L (ref 98–111)
Creatinine, Ser: 1.23 mg/dL (ref 0.61–1.24)
GFR calc Af Amer: >60 mL/min (ref >60)
GFR calc non Af Amer: 58 mL/min — ABNORMAL LOW (ref >60)
Glucose, Bld: 112 mg/dL — ABNORMAL HIGH (ref 70–99)
Potassium: 3.8 mmol/L (ref 3.5–5.1)
Sodium: 137 mmol/L (ref 135–145)

## 2019-12-30 LAB — CBC
HCT: 26.6 % — ABNORMAL LOW (ref 39.0–52.0)
Hemoglobin: 7.3 g/dL — ABNORMAL LOW (ref 13.0–17.0)
MCH: 22.8 pg — ABNORMAL LOW (ref 26.0–34.0)
MCHC: 27.4 g/dL — ABNORMAL LOW (ref 30.0–36.0)
MCV: 83.1 fL (ref 80.0–100.0)
Platelets: 113 10*3/uL — ABNORMAL LOW (ref 150–400)
RBC: 3.2 MIL/uL — ABNORMAL LOW (ref 4.22–5.81)
RDW: 17 % — ABNORMAL HIGH (ref 11.5–15.5)
WBC: 3.3 10*3/uL — ABNORMAL LOW (ref 4.0–10.5)
nRBC: 0 % (ref 0.0–0.2)

## 2019-12-30 LAB — GLUCOSE, CAPILLARY
Glucose-Capillary: 111 mg/dL — ABNORMAL HIGH (ref 70–99)
Glucose-Capillary: 119 mg/dL — ABNORMAL HIGH (ref 70–99)
Glucose-Capillary: 130 mg/dL — ABNORMAL HIGH (ref 70–99)
Glucose-Capillary: 131 mg/dL — ABNORMAL HIGH (ref 70–99)

## 2019-12-30 LAB — PATHOLOGIST SMEAR REVIEW

## 2019-12-30 MED ORDER — POTASSIUM CHLORIDE CRYS ER 20 MEQ PO TBCR
20.0000 meq | EXTENDED_RELEASE_TABLET | Freq: Two times a day (BID) | ORAL | Status: DC
  Administered 2019-12-30 – 2020-01-04 (×11): 20 meq via ORAL
  Filled 2019-12-30 (×11): qty 1

## 2019-12-30 MED ORDER — LIVING BETTER WITH HEART FAILURE BOOK: Freq: Once | Status: DC

## 2019-12-30 NOTE — Progress Notes (Addendum)
Progress Note  Patient Name: [name] Date of Encounter: 12/30/2019  Primary Cardiologist: Mertie Moores, MD  Subjective   No CP. No SOB. Some wheezing, reports he's overall feeling better. Still swollen. Reports this came about over 2 weeks or so.  Inpatient Medications    Scheduled Meds: . docusate sodium  100 mg Oral Daily  . enoxaparin (LOVENOX) injection  40 mg Subcutaneous Q24H  . gabapentin  100 mg Oral BID  . insulin aspart  0-15 Units Subcutaneous TID AC & HS  . polyethylene glycol  17 g Oral Daily  . sodium chloride flush  3 mL Intravenous Q12H  . umeclidinium-vilanterol  1 puff Inhalation Daily   Continuous Infusions: . sodium chloride    . furosemide 120 mg (12/30/19 0852)   PRN Meds: sodium chloride, acetaminophen, albuterol, albuterol, nitroGLYCERIN, ondansetron (ZOFRAN) IV, sodium chloride flush   Vital Signs    Vitals:   12/29/19 1439 12/29/19 2037 12/30/19 0535 12/30/19 0803  BP: 130/64 (!) 123/55 139/65   Pulse: 72 67 72   Resp: 20 19 16    Temp: 97.9 F (36.6 C) 98.1 F (36.7 C) 98 F (36.7 C)   TempSrc: Oral Oral Oral   SpO2: 97% 96% 93% 94%  Weight:   126 kg   Height:        Intake/Output Summary (Last 24 hours) at 12/30/2019 1232 Last data filed at 12/30/2019 1055 Gross per 24 hour  Intake 978 ml  Output 3085 ml  Net -2107 ml   Last 3 Weights 12/30/2019 12/29/2019 12/28/2019  Weight (lbs) 277 lb 12.5 oz 278 lb 7.1 oz 267 lb 10.2 oz  Weight (kg) 126 kg 126.3 kg 121.4 kg     Telemetry    NSR - Personally Reviewed  Physical Exam   GEN: No acute distress.  HEENT: Normocephalic, atraumatic, sclera non-icteric. Neck: No JVD or bruits. Cardiac: RRR no murmurs, rubs, or gallops.  Radials/DP/PT 1+ and equal bilaterally.  Respiratory: Sporadic quiet expiratory wheeze. No rales/rhonchi. Breathing is unlabored. GI: Soft, nontender, non-distended, BS +x 4. MS: no deformity. Extremities: No clubbing or cyanosis. Severe bilateral LE edema, 2+  from pedal range up into thighs and abdomen. Distal pedal pulses are challenging given edema but extremities are warm Neuro:  AAOx3. Follows commands. Psych:  Responds to questions appropriately with a normal affect.  Labs    High Sensitivity Troponin:   Recent Labs  Lab 12/26/19 2004  TROPONINIHS 9      Cardiac EnzymesNo results for input(s): TROPONINI in the last 168 hours. No results for input(s): TROPIPOC in the last 168 hours.   Chemistry Recent Labs  Lab 12/28/19 0611 12/29/19 0506 12/30/19 0556  NA 138 135 137  K 3.7 3.8 3.8  CL 103 102 103  CO2 25 26 27   GLUCOSE 106* 137* 112*  BUN 26* 26* 26*  CREATININE 1.31* 1.22 1.23  CALCIUM 8.7* 8.3* 8.4*  GFRNONAA 54* 58* 58*  GFRAA >60 >60 >60  ANIONGAP 10 7 7      Hematology Recent Labs  Lab 12/28/19 0611 12/28/19 1327 12/29/19 0506 12/29/19 1046 12/30/19 0556  WBC 3.4*  --  3.1*  --  3.3*  RBC 3.23*  --  3.06*  --  3.20*  HGB 7.2*   < > 7.0* 7.7* 7.3*  HCT 27.1*   < > 25.5* 27.7* 26.6*  MCV 83.9  --  83.3  --  83.1  MCH 22.3*  --  22.9*  --  22.8*  MCHC 26.6*  --  27.5*  --  27.4*  RDW 17.2*  --  17.1*  --  17.0*  PLT 122*  --  114*  --  113*   < > = values in this interval not displayed.    BNP Recent Labs  Lab 12/26/19 2012  BNP 266.2*     DDimer No results for input(s): DDIMER in the last 168 hours.   Radiology    No results found.  Cardiac Studies   2D Echo 07/2019  1. Left ventricular ejection fraction, by visual estimation, is 55 to  60%. The left ventricle has normal function. There is no left ventricular  hypertrophy.  2. Abnormal septal motion.  3. Global right ventricle has normal systolic function.The right  ventricular size is normal. No increase in right ventricular wall  thickness.  4. Left atrial size was moderately dilated.  5. Right atrial size was normal.  6. Moderate calcification of the mitral valve leaflet(s).  7. Moderate mitral annular calcification.  8.  Moderate thickening of the mitral valve leaflet(s).  9. The mitral valve is normal in structure. Trace mitral valve  regurgitation. No evidence of mitral stenosis.  10. The tricuspid valve is normal in structure. Tricuspid valve  regurgitation is trivial.  11. The aortic valve is tricuspid. Aortic valve regurgitation is not  visualized. sclerosis without stenosis.  12. There is Moderate thickening of the aortic valve.  13. There is Severe calcifcation of the aortic valve.  14. The pulmonic valve was normal in structure. Pulmonic valve  regurgitation is trivial.  15. Moderately elevated pulmonary artery systolic pressure.  16. The inferior vena cava is normal in size with greater than 50%  respiratory variability, suggesting right atrial pressure of 3 mmHg.   Patient Profile     74 y.o. male with CAD s/p prior PCI 2013 and CABG 2353, chronic diastolic CHF, IRWER-15 infection 10/2019, chronic back pain, asthma, arthritis, hepatic cirrhosis, prior GIB, gout, HLD, GERD, DM, HTN, chronic anemia who presented to [facility] with increased LEE. Also reported some SOB although not primary complaint. Unclear dry weight but pt appeared at least 30-40lb up from prior values.  Assessment & Plan    1. Acute on chronic diastolic CHF - continues to diurese on high dose Lasix 120mg  BID. Weights are confusing. Weighed at 239/240 on admission, then 267->278->277. He is making good urine with 3600cc so would continue present regimen. Add KCl 58meq BID (K 3.8 before diuretic this AM). F/u BMET in AM. Will also f/u LFTs to assess albumin given liver disease. Add fluid restriction to diet.  2. CAD - no chest pain complaints. hsTroponin is negative. No further workup planned at present time. Aspirin previously stopped in 11/2018 due to GI bleeding requiring transfusion. Statin also discontinued at that time. Appreciate input from IM whether OK to resume at some point - note slight worsening of Hgb this admission so may need  to trend first before committing back.  3. AKI superimposed on suspected CKD stage II-III - peak Cr 1.31, holding steady in 1.2 range which is is similar to prior baseline.  4. Anemia with pancytopenia - slight worsening of Hgb this admission, also with decreased WBC/platelets. Per primary team.  5. OSA - patient refuses CPAP.  6. Asthma - quiet expiratory wheezing noted. Also mentioned in IM notes. May be due in part to cardiac wheezing. Appreciate primary team's management of asthma.  For questions or updates, please contact [place] Please consult [email or web address] for  contact info under Cardiology/STEMI.  Signed, Charlie Pitter, PA-C 12/30/2019, 12:32 PM

## 2019-12-30 NOTE — Progress Notes (Signed)
Patient continues to decline nocturnal CPAP at this time. He states that he hasn't worn his home machine in well over a year due to electrical issues in his home. He also conveys to me that his landlord is aware of the fuse blowing when his machine in plugged in and that his wife has issues with the same fuse at times when she uses her machine. However, he states that his landlord refuses to update the electrical box of the home. RN made aware. RT will continue to follow and encourage compliance.

## 2019-12-30 NOTE — TOC Initial Note (Signed)
Transition of Care [facility]) - Initial/Assessment Note    Patient Details  Name: [name] MRN: [id number] Date of Birth: 1946-05-20  Transition of Care [facility]) CM/SW Contact:    Joaquin Courts, RN Phone Number: 12/30/2019, 9:33 AM  Clinical Narrative:  CM noted TOC consult for Heart failure HH screen.  MD to place PT/OT evaluation orders.  CM will follow for recommendations and arrange accordingly.                    Expected Discharge Plan: Home/Self Care Barriers to Discharge: Continued Medical Work up   Patient Goals and CMS Choice Patient states their goals for this hospitalization and ongoing recovery are:: to go home      Expected Discharge Plan and Services Expected Discharge Plan: Home/Self Care   Discharge Planning Services: CM Consult   Living arrangements for the past 2 months: Single Family Home                                      Prior Living Arrangements/Services Living arrangements for the past 2 months: Single Family Home   Patient language and need for interpreter reviewed:: Yes Do you feel safe going back to the place where you live?: Yes      Need for Family Participation in Patient Care: No (Comment) Care giver support system in place?: No (comment)   Criminal Activity/Legal Involvement Pertinent to Current Situation/Hospitalization: No - Comment as needed  Activities of Daily Living Home Assistive Devices/Equipment: CBG Meter, Walker (specify type), Cane (specify quad or straight), Other (Comment)(single point cane, front wheeled walker) ADL Screening (condition at time of admission) Patient's cognitive ability adequate to safely complete daily activities?: Yes Is the patient deaf or have difficulty hearing?: No Does the patient have difficulty seeing, even when wearing glasses/contacts?: No Does the patient have difficulty concentrating, remembering, or making decisions?: No Patient able to express need for assistance with ADLs?:  Yes Does the patient have difficulty dressing or bathing?: Yes Independently performs ADLs?: No Communication: Independent Dressing (OT): Needs assistance Is this a change from baseline?: Change from baseline, expected to last >3 days Grooming: Needs assistance Is this a change from baseline?: Change from baseline, expected to last >3 days Feeding: Needs assistance Is this a change from baseline?: Change from baseline, expected to last >3 days Bathing: Needs assistance Is this a change from baseline?: Change from baseline, expected to last >3 days Toileting: Needs assistance Is this a change from baseline?: Change from baseline, expected to last >3days In/Out Bed: Needs assistance Is this a change from baseline?: Change from baseline, expected to last >3 days Walks in Home: Needs assistance Is this a change from baseline?: Change from baseline, expected to last >3 days Does the patient have difficulty walking or climbing stairs?: Yes(secondary to shortness of breath) Weakness of Legs: Both Weakness of Arms/Hands: None  Permission Sought/Granted                  Emotional Assessment           Psych Involvement: No (comment)  Admission diagnosis:  Acute exacerbation of CHF (congestive heart failure) ([place]) [I50.9] Ascites [R18.8] Acute on chronic diastolic (congestive) heart failure (HCC) [I50.33] Acute on chronic congestive heart failure, unspecified heart failure type [facility]) [I50.9] Patient Active Problem List   Diagnosis Date Noted  . Type 2 diabetes mellitus with hyperglycemia, without long-term  current use of insulin ([place]) 12/27/2019  . Asthma, mild intermittent 12/27/2019  . Acute on chronic diastolic (congestive) heart failure ([place]) 12/27/2019  . Alcoholic cirrhosis of liver with ascites ([place]) 12/27/2019  . Acute exacerbation of CHF (congestive heart failure) ([place]) 12/27/2019  . COVID-19 virus infection 11/04/2019  . Acute on chronic diastolic CHF (congestive  heart failure) ([place]) 11/01/2019  . Volume overload 07/29/2019  . Itching 03/22/2019  . Skin lesion 03/22/2019  . Hyponatremia 12/24/2018  . (HFpEF) heart failure with preserved ejection fraction ([place]) 12/24/2018  . Precordial chest pain   . Neck pain 09/12/2018  . CKD (chronic kidney disease), stage III 12/18/2017  . Acute on chronic anemia 12/18/2017  . Peptic ulcer disease 12/18/2017  . GI bleed 09/29/2017  . Shortness of breath 09/21/2017  . Morbid obesity ([place]) 02/05/2016  . Numerous moles 09/15/2015  . S/P CABG x 3 03/31/2014  . Hepatic cirrhosis ([place]) 02/27/2014  . Acute blood loss anemia 08/13/2012  . Hyperlipidemia with target LDL less than 70 06/29/2012  . Thrombocytopenia ([place]) 06/29/2012  . Coronary artery disease involving native coronary artery of native heart without angina pectoris   . OSA (obstructive sleep apnea)   . Type 2 diabetes with complication ([place]) 69/45/0388  . GERD without esophagitis 08/04/2010  . History of gout   . Hypertensive heart disease    PCP:  [name], MD Pharmacy:   [facility] [place], [address] AT [address] [place] [address] Phone: [phone] Fax: [phone]     Social Determinants of Health (SDOH) Interventions    Readmission Risk Interventions Readmission Risk Prevention Plan 12/30/2019 08/01/2019  Transportation Screening Complete Complete  PCP or Specialist Appt within 5-7 Days - Not Complete  Not Complete comments - disposition pending  Home Care Screening - Complete  Medication Review (RN CM) - Complete  Palliative Care Screening Not Applicable -  Medication Review (RN Care Manager) Complete -  Some recent data might be hidden

## 2019-12-30 NOTE — Progress Notes (Signed)
PROGRESS NOTE    [name]    Code Status: Full Code  TGG:[id number] DOB: 1946/08/16 DOA: 12/26/2019 LOS: 3 days  PCP: [name], MD CC:  Chief Complaint  Patient presents with  . Shortness of Breath       Hospital Summary   [name] is a 74 y.o. male with a history of HFpEF, CKD 3, type 2 diabetes, cirrhosis, COVID-19 infection diagnosed 10/31/2019, CAD status post DES x2 and CABG, diabetic peripheral neuropathy, iron deficiency anemia, asthma who was admitted for increasing shortness of breath and bilateral lower extremity edema x2 weeks and admitted for HFpEF exacerbation.  Overnight 5/1, patient had increased wheeze and difficulty breathing which improved with neb treatment.   5/2: Volume overload increasing despite increased lasix. Echo ordered, Cardiology consulted.   A & P   Active Problems:   GERD without esophagitis   OSA (obstructive sleep apnea)   Coronary artery disease involving native coronary artery of native heart without angina pectoris   CKD (chronic kidney disease), stage III   Acute on chronic diastolic CHF (congestive heart failure) (HCC)   Type 2 diabetes mellitus with hyperglycemia, without long-term current use of insulin (HCC)   Asthma, mild intermittent   Acute on chronic diastolic (congestive) heart failure (HCC)   Alcoholic cirrhosis of liver with ascites (HCC)   Acute exacerbation of CHF (congestive heart failure) ([place])    1. Acute on chronic HFpEF exacerbation  a. Echo 07/2019: EF 55-60% with moderately dilated left atria and mod mitral calcification b. Wt increased since admission despite -6 L output c. Still has pitting edema of abdomen  d. Continue I/O and daily weights e. Continue lasix 120 mg IV BID f. Low sodium diet, fluid restriction g. Cardiology on board: knee high compression stockings, patient will need several days of IV diuresis, checking the albumin level h. I wonder if he may need addition of metolazone or  ultrafiltration if his volume status continues to worsen  2. Alcoholic Cirrhosis, not in acute decompensation a. On spironolactone as outpatient b. No ascites on imaging c. Holding spironolactone due to AKI  3. AKI Likely cardio- renal which is improving with diuresis a. Continue diuresis b. Avoid nephrotoxic agents   4. COVID-19 positive a. Off precautions as patient was diagnosed on 10/31/19 and still within 16 'positive test' window b. Discussed with infection prevention and ok to be off precautions  5. Wheeze Likely cardiac-wheeze from heart failure a. Improving but still wheezy b. Continue diuresis and continue PRN nebs and anoro ellipta  6. Acute on chronic anemia with history of PUD, chronic blood loss anemia and gastric angioectasias a. EGD 12/20/17 with 5 nonbleeding anbioectasias of the stomach treated with argon plasma coagulation and nonbleeding duodenal diverticula with Dr. Watt Climes [facility]) b. Patient was scheduled for a colonoscopy following his 2019 EGD but this was cancelled and was lost to follow up c. Hb slowly dropping since admission 7.5->7.2-> 7.0->7.7->7.3 this morning  d. Patient has consented to blood transfusion however I have chosen to hold off on PRBC transfusion at this time since the patient has massive volume overload and is asymptomatic from an anemia standpoint and Hb >7 e. If Hb <7.0 then will transfuse with lasix  f. FOBT - if positive, will consult GI but will need outpatient follow up with GI  7. Pancytopenia, unknown etiology probably from cirrhosis a. Peripheral smear with pancytopenia b. TSH unremarkable, HIV negative c. HCV screen negative in 2017 d. If  this continues to worsen then will ask Heme/Onc to see the patient  8. CAD with history of DES and CABG, stable  9. Type 2 diabetes currently with sliding scale  10. Controlled mild admission asthma  11. GERD  12. Constipation 1. miralax and colace  13. OSA refusing CPAP  DVT  prophylaxis: lovenox Family Communication: patient updated at bedside and stated that he does not need family updated Disposition Plan:  Status is: Inpatient  Remains inpatient appropriate because:IV treatments appropriate due to intensity of illness or inability to take PO and Inpatient level of care appropriate due to severity of illness Still needs IV lasix and volume management   Dispo: The patient is from: Home              Anticipated d/c is to: Home              Anticipated d/c date is: > 3 days              Patient currently is not medically stable to d/c.   Pressure injury documentation    None  Consultants  Cardiology  Procedures  None  Antibiotics   Anti-infectives (From admission, onward)   None        Subjective   States he is feeling better today and had a BM. Denies shortness of breath. Refusing CPAP overnight. No other issues.  Objective   Vitals:   12/29/19 1439 12/29/19 2037 12/30/19 0535 12/30/19 0803  BP: 130/64 (!) 123/55 139/65   Pulse: 72 67 72   Resp: 20 19 16    Temp: 97.9 F (36.6 C) 98.1 F (36.7 C) 98 F (36.7 C)   TempSrc: Oral Oral Oral   SpO2: 97% 96% 93% 94%  Weight:   126 kg   Height:        Intake/Output Summary (Last 24 hours) at 12/30/2019 1414 Last data filed at 12/30/2019 1055 Gross per 24 hour  Intake 978 ml  Output 2585 ml  Net -1607 ml   Filed Weights   12/28/19 0500 12/29/19 0615 12/30/19 0535  Weight: 121.4 kg 126.3 kg 126 kg    Examination:  Physical Exam Vitals and nursing note reviewed.  Constitutional:      General: He is not in acute distress. HENT:     Head: Normocephalic.  Cardiovascular:     Rate and Rhythm: Normal rate and regular rhythm.  Pulmonary:     Effort: Pulmonary effort is normal.     Breath sounds: Wheezing present.  Musculoskeletal:     Right lower leg: No tenderness. Edema present.     Left lower leg: No tenderness. Edema present.  Neurological:     Mental Status: He is alert.    Psychiatric:        Mood and Affect: Mood normal.     Data Reviewed: I have personally reviewed following labs and imaging studies  CBC: Recent Labs  Lab 12/26/19 2004 12/26/19 2004 12/28/19 0611 12/28/19 1327 12/29/19 0506 12/29/19 1046 12/30/19 0556  WBC 4.2  --  3.4*  --  3.1*  --  3.3*  NEUTROABS  --   --  2.4  --   --   --   --   HGB 7.5*   < > 7.2* 7.2* 7.0* 7.7* 7.3*  HCT 27.1*   < > 27.1* 26.1* 25.5* 27.7* 26.6*  MCV 83.4  --  83.9  --  83.3  --  83.1  PLT 125*  --  122*  --  114*  --  113*   < > = values in this interval not displayed.   Basic Metabolic Panel: Recent Labs  Lab 12/26/19 2004 12/27/19 1652 12/28/19 0611 12/29/19 0506 12/30/19 0556  NA 139  --  138 135 137  K 3.9  --  3.7 3.8 3.8  CL 106  --  103 102 103  CO2 23  --  25 26 27   GLUCOSE 146*  --  106* 137* 112*  BUN 26*  --  26* 26* 26*  CREATININE 1.24  --  1.31* 1.22 1.23  CALCIUM 8.7*  --  8.7* 8.3* 8.4*  MG  --  2.3  --  2.2 2.3   GFR: Estimated Creatinine Clearance: 69.1 mL/min (by C-G formula based on SCr of 1.23 mg/dL). Liver Function Tests: No results for input(s): AST, ALT, ALKPHOS, BILITOT, PROT, ALBUMIN in the last 168 hours. No results for input(s): LIPASE, AMYLASE in the last 168 hours. No results for input(s): AMMONIA in the last 168 hours. Coagulation Profile: No results for input(s): INR, PROTIME in the last 168 hours. Cardiac Enzymes: No results for input(s): CKTOTAL, CKMB, CKMBINDEX, TROPONINI in the last 168 hours. BNP (last 3 results) No results for input(s): PROBNP in the last 8760 hours. HbA1C: No results for input(s): HGBA1C in the last 72 hours. CBG: Recent Labs  Lab 12/29/19 1127 12/29/19 1625 12/29/19 2040 12/30/19 0751 12/30/19 1137  GLUCAP 167* 125* 137* 111* 119*   Lipid Profile: No results for input(s): CHOL, HDL, LDLCALC, TRIG, CHOLHDL, LDLDIRECT in the last 72 hours. Thyroid Function Tests: Recent Labs    12/28/19 1112  TSH 2.761   Anemia  Panel: No results for input(s): VITAMINB12, FOLATE, FERRITIN, TIBC, IRON, RETICCTPCT in the last 72 hours. Sepsis Labs: No results for input(s): PROCALCITON, LATICACIDVEN in the last 168 hours.  Recent Results (from the past 240 hour(s))  Respiratory Panel by RT PCR (Flu A&B, Covid) - Nasopharyngeal Swab     Status: Abnormal   Collection Time: 12/27/19  4:22 AM   Specimen: Nasopharyngeal Swab  Result Value Ref Range Status   SARS Coronavirus 2 by RT PCR POSITIVE (A) NEGATIVE Final    Comment: RESULT CALLED TO, READ BACK BY AND VERIFIED WITH: FREAKEY,J. RN AT 7322 12/27/19 MULLINS,T (NOTE) SARS-CoV-2 target nucleic acids are DETECTED. SARS-CoV-2 RNA is generally detectable in upper respiratory specimens  during the acute phase of infection. Positive results are indicative of the presence of the identified virus, but do not rule out bacterial infection or co-infection with other pathogens not detected by the test. Clinical correlation with patient history and other diagnostic information is necessary to determine patient infection status. The expected result is Negative. Fact Sheet for Patients:  PinkCheek.be Fact Sheet for Healthcare Providers: GravelBags.it This test is not yet approved or cleared by the [place] FDA and  has been authorized for detection and/or diagnosis of SARS-CoV-2 by FDA under an Emergency Use Authorization (EUA).  This EUA will remain in effect (meaning this test can be [place] ed) for the duration of  the COVID-19 declaration under Section 564(b)(1) of the Act, 21 U.S.C. section 360bbb-3(b)(1), unless the authorization is terminated or revoked sooner.    Influenza A by PCR NEGATIVE NEGATIVE Final   Influenza B by PCR NEGATIVE NEGATIVE Final    Comment: (NOTE) The Xpert Xpress SARS-CoV-2/FLU/RSV assay is intended as an aid in  the diagnosis of influenza from Nasopharyngeal swab specimens and  should  not be used as a sole  basis for treatment. Nasal washings and  aspirates are unacceptable for Xpert Xpress SARS-CoV-2/FLU/RSV  testing. Fact Sheet for Patients: PinkCheek.be Fact Sheet for Healthcare Providers: GravelBags.it This test is not yet approved or cleared by the [place] FDA and  has been authorized for detection and/or diagnosis of SARS-CoV-2 by  FDA under an Emergency Use Authorization (EUA). This EUA will remain  in effect (meaning this test can be used) for the duration of the  Covid-19 declaration under Section 564(b)(1) of the Act, 21  U.S.C. section 360bbb-3(b)(1), unless the authorization is  terminated or revoked. Performed at [facility], [place] [address], [place], [place] [address]          Radiology Studies: No results found.      Scheduled Meds: . docusate sodium  100 mg Oral Daily  . enoxaparin (LOVENOX) injection  40 mg Subcutaneous Q24H  . gabapentin  100 mg Oral BID  . insulin aspart  0-15 Units Subcutaneous TID AC & HS  . Living Better with Heart Failure Book   Does not apply Once  . polyethylene glycol  17 g Oral Daily  . potassium chloride  20 mEq Oral BID  . sodium chloride flush  3 mL Intravenous Q12H  . umeclidinium-vilanterol  1 puff Inhalation Daily   Continuous Infusions: . sodium chloride    . furosemide 120 mg (12/30/19 0852)     Time spent: 25 minutes with over 50% of the time coordinating the patient's care    Harold Hedge, DO [facility] Pager [phone]  Call night coverage person covering after 7pm

## 2019-12-30 NOTE — Evaluation (Addendum)
Physical Therapy Evaluation Patient Details Name: [name] MRN: [id number] DOB: 1946/03/23 Today's Date: 12/30/2019   History of Present Illness  74 y.o. male with a history of HFpEF, CKD 3, type 2 diabetes, cirrhosis, COVID-19 infection diagnosed 10/31/2019, CAD status post DES x2 and CABG, diabetic peripheral neuropathy, iron deficiency anemia, asthma who was admitted for increasing shortness of breath and bilateral lower extremity edema x2 weeks and admitted for HFpEF exacerbation  Clinical Impression  Pt admitted with above diagnosis. Pt ambulated 160' without an assistive device, no loss of balance, SaO2 90% on room air walking, 2/4 dyspnea. Instructed pt in ankle pumps for edema management.  Pt currently with functional limitations due to the deficits listed below (see PT Problem List). Pt will benefit from skilled PT to increase their independence and safety with mobility to allow discharge to the venue listed below.       Follow Up Recommendations No PT follow up    Equipment Recommendations  3in1 (PT)    Recommendations for Other Services       Precautions / Restrictions Precautions Precautions: Fall;Other (comment) Precaution Comments: 2 falls in past 1 year 2* "tripping over things", monitor O2 Restrictions Weight Bearing Restrictions: No      Mobility  Bed Mobility               General bed mobility comments: up walking in room  Transfers                 General transfer comment: up walking in room (pt reports sit to stand is a challenge at home)  Ambulation/Gait Ambulation/Gait assistance: Supervision Gait Distance (Feet): 160 Feet Assistive device: None Gait Pattern/deviations: WFL(Within Functional Limits) Gait velocity: WFL   General Gait Details: 2/4 dyspnea, SaO2 90% on room air walking, no loss of balance  Stairs            Wheelchair Mobility    Modified Rankin (Stroke Patients Only)       Balance Overall balance  assessment: No apparent balance deficits (not formally assessed)                                           Pertinent Vitals/Pain Pain Assessment: No/denies pain    Home Living Family/patient expects to be discharged to:: Private residence Living Arrangements: Children Available Help at Discharge: Family;Available 24 hours/day   Home Access: Stairs to enter   Entrance Stairs-Number of Steps: "I'm not sure" Home Layout: Two level;Able to live on main level with bedroom/bathroom Home Equipment: Gilford Rile - 2 wheels      Prior Function Level of Independence: Independent         Comments: walks without AD, reports sit to stand is challenging     Hand Dominance        Extremity/Trunk Assessment   Upper Extremity Assessment Upper Extremity Assessment: Overall WFL for tasks assessed    Lower Extremity Assessment Lower Extremity Assessment: Overall WFL for tasks assessed(pitting edema noted BLEs)    Cervical / Trunk Assessment Cervical / Trunk Assessment: Normal  Communication   Communication: HOH  Cognition Arousal/Alertness: Awake/alert Behavior During Therapy: WFL for tasks assessed/performed Overall Cognitive Status: Within Functional Limits for tasks assessed  General Comments      Exercises  ankle pumps x 10 both supine   Assessment/Plan    PT Assessment Patient needs continued PT services  PT Problem List Cardiopulmonary status limiting activity;Decreased activity tolerance       PT Treatment Interventions Gait training;Therapeutic activities;Therapeutic exercise;Patient/family education    PT Goals (Current goals can be found in the Care Plan section)  Acute Rehab PT Goals Patient Stated Goal: "get rid of this fluid" PT Goal Formulation: With patient Time For Goal Achievement: 01/13/20 Potential to Achieve Goals: Good    Frequency Min 3X/week   Barriers to discharge         Co-evaluation               AM-PAC PT "6 Clicks" Mobility  Outcome Measure Help needed turning from your back to your side while in a flat bed without using bedrails?: None Help needed moving from lying on your back to sitting on the side of a flat bed without using bedrails?: None Help needed moving to and from a bed to a chair (including a wheelchair)?: None Help needed standing up from a chair using your arms (e.g., wheelchair or bedside chair)?: A Little Help needed to walk in hospital room?: None Help needed climbing 3-5 steps with a railing? : A Little 6 Click Score: 22    End of Session Equipment Utilized During Treatment: Gait belt Activity Tolerance: Patient limited by fatigue Patient left: in chair;with call bell/phone within reach Nurse Communication: Mobility status PT Visit Diagnosis: Difficulty in walking, not elsewhere classified (R26.2)    Time: 2841-3244 PT Time Calculation (min) (ACUTE ONLY): 18 min   Charges:   PT Evaluation $PT Eval Low Complexity: 1 Low          Philomena Doheny PT 12/30/2019  Acute Rehabilitation Services Pager [phone] Office [phone]

## 2019-12-31 DIAGNOSIS — I2510 Atherosclerotic heart disease of native coronary artery without angina pectoris: Secondary | ICD-10-CM | POA: Diagnosis not present

## 2019-12-31 DIAGNOSIS — I5033 Acute on chronic diastolic (congestive) heart failure: Secondary | ICD-10-CM | POA: Diagnosis not present

## 2019-12-31 DIAGNOSIS — N1831 Chronic kidney disease, stage 3a: Secondary | ICD-10-CM | POA: Diagnosis not present

## 2019-12-31 LAB — CBC
HCT: 27.1 % — ABNORMAL LOW (ref 39.0–52.0)
Hemoglobin: 7.5 g/dL — ABNORMAL LOW (ref 13.0–17.0)
MCH: 23 pg — ABNORMAL LOW (ref 26.0–34.0)
MCHC: 27.7 g/dL — ABNORMAL LOW (ref 30.0–36.0)
MCV: 83.1 fL (ref 80.0–100.0)
Platelets: 129 10*3/uL — ABNORMAL LOW (ref 150–400)
RBC: 3.26 MIL/uL — ABNORMAL LOW (ref 4.22–5.81)
RDW: 16.9 % — ABNORMAL HIGH (ref 11.5–15.5)
WBC: 3.7 10*3/uL — ABNORMAL LOW (ref 4.0–10.5)
nRBC: 0 % (ref 0.0–0.2)

## 2019-12-31 LAB — GLUCOSE, CAPILLARY
Glucose-Capillary: 94 mg/dL (ref 70–99)
Glucose-Capillary: 129 mg/dL — ABNORMAL HIGH (ref 70–99)
Glucose-Capillary: 111 mg/dL — ABNORMAL HIGH (ref 70–99)
Glucose-Capillary: 119 mg/dL — ABNORMAL HIGH (ref 70–99)

## 2019-12-31 LAB — HEPATIC FUNCTION PANEL
ALT: 18 U/L (ref 0–44)
AST: 25 U/L (ref 15–41)
Albumin: 3.8 g/dL (ref 3.5–5.0)
Alkaline Phosphatase: 93 U/L (ref 38–126)
Bilirubin, Direct: 0.1 mg/dL (ref 0.0–0.2)
Indirect Bilirubin: 0.7 mg/dL (ref 0.3–0.9)
Total Bilirubin: 0.8 mg/dL (ref 0.3–1.2)
Total Protein: 7.1 g/dL (ref 6.5–8.1)

## 2019-12-31 LAB — BASIC METABOLIC PANEL
Anion gap: 9 (ref 5–15)
BUN: 25 mg/dL — ABNORMAL HIGH (ref 8–23)
CO2: 28 mmol/L (ref 22–32)
Calcium: 8.5 mg/dL — ABNORMAL LOW (ref 8.9–10.3)
Chloride: 101 mmol/L (ref 98–111)
Creatinine, Ser: 1.02 mg/dL (ref 0.61–1.24)
GFR calc Af Amer: >60 mL/min (ref >60)
GFR calc non Af Amer: >60 mL/min (ref >60)
Glucose, Bld: 103 mg/dL — ABNORMAL HIGH (ref 70–99)
Potassium: 4 mmol/L (ref 3.5–5.1)
Sodium: 138 mmol/L (ref 135–145)

## 2019-12-31 MED ORDER — ATORVASTATIN CALCIUM 40 MG PO TABS
40.0000 mg | ORAL_TABLET | Freq: Every day | ORAL | Status: DC
  Administered 2019-12-31 – 2020-01-03 (×4): 40 mg via ORAL
  Filled 2019-12-31 (×4): qty 1

## 2019-12-31 MED ORDER — SPIRONOLACTONE 25 MG PO TABS
25.0000 mg | ORAL_TABLET | Freq: Every day | ORAL | Status: DC
  Administered 2019-12-31 – 2020-01-04 (×5): 25 mg via ORAL
  Filled 2019-12-31 (×5): qty 1

## 2019-12-31 NOTE — Progress Notes (Signed)
Physical Therapy Treatment Patient Details Name: [name] MRN: [id number] DOB: 03-28-1946 Today's Date: 12/31/2019    History of Present Illness 74 y.o. male with a history of HFpEF, CKD 3, type 2 diabetes, cirrhosis, COVID-19 infection diagnosed 10/31/2019, CAD status post DES x2 and CABG, diabetic peripheral neuropathy, iron deficiency anemia, asthma who was admitted for increasing shortness of breath and bilateral lower extremity edema x2 weeks and admitted for HFpEF exacerbation    PT Comments    Pt ambulated 240' without assistive device, 3/4 dyspna, SaO2 98% on room air, RR 28, no loss of balance.   Follow Up Recommendations  No PT follow up     Equipment Recommendations  3in1 (PT)    Recommendations for Other Services       Precautions / Restrictions Precautions Precautions: Fall;Other (comment) Precaution Comments: 2 falls in past 1 year 2* "tripping over things", monitor O2 Restrictions Weight Bearing Restrictions: No    Mobility  Bed Mobility Overal bed mobility: Independent                Transfers Overall transfer level: Independent Equipment used: None                Ambulation/Gait Ambulation/Gait assistance: Supervision Gait Distance (Feet): 240 Feet Assistive device: None Gait Pattern/deviations: WFL(Within Functional Limits) Gait velocity: WFL   General Gait Details: 3/4 dyspnea, no loss of balance, SaO2 98% on room air, RR 28, VCs to stop and rest 2* dyspnea   Stairs             Wheelchair Mobility    Modified Rankin (Stroke Patients Only)       Balance Overall balance assessment: No apparent balance deficits (not formally assessed)                                          Cognition Arousal/Alertness: Awake/alert Behavior During Therapy: WFL for tasks assessed/performed Overall Cognitive Status: Within Functional Limits for tasks assessed                                         Exercises General Exercises - Lower Extremity Ankle Circles/Pumps: AROM;Both;10 reps;Supine    General Comments        Pertinent Vitals/Pain Pain Assessment: No/denies pain    Home Living                      Prior Function            PT Goals (current goals can now be found in the care plan section) Acute Rehab PT Goals Patient Stated Goal: "get rid of this fluid" PT Goal Formulation: With patient Time For Goal Achievement: 01/13/20 Potential to Achieve Goals: Good Progress towards PT goals: Progressing toward goals    Frequency    Min 3X/week      PT Plan Current plan remains appropriate    Co-evaluation              AM-PAC PT "6 Clicks" Mobility   Outcome Measure  Help needed turning from your back to your side while in a flat bed without using bedrails?: None Help needed moving from lying on your back to sitting on the side of a flat bed without using bedrails?: None Help needed moving to  and from a bed to a chair (including a wheelchair)?: None Help needed standing up from a chair using your arms (e.g., wheelchair or bedside chair)?: None Help needed to walk in hospital room?: None Help needed climbing 3-5 steps with a railing? : A Little 6 Click Score: 23    End of Session Equipment Utilized During Treatment: Gait belt Activity Tolerance: Patient limited by fatigue Patient left: in chair;with call bell/phone within reach Nurse Communication: Mobility status PT Visit Diagnosis: Difficulty in walking, not elsewhere classified (R26.2)     Time: 1947-1252 PT Time Calculation (min) (ACUTE ONLY): 13 min  Charges:  $Gait Training: 8-22 mins                    Blondell Reveal Kistler PT 12/31/2019  Acute Rehabilitation Services Pager [phone] Office [phone]

## 2019-12-31 NOTE — Progress Notes (Signed)
PROGRESS NOTE    [name]    Code Status: Full Code  [name]:[id number] DOB: [birth year] DOA: 12/26/2019 LOS: 4 days  PCP: [name], MD CC:  Chief Complaint  Patient presents with  . Shortness of Breath       Hospital Summary   [name] is a 74 y.o. male with a history of HFpEF, CKD 3, type 2 diabetes, cirrhosis, COVID-19 infection diagnosed 10/31/2019, CAD status post DES x2 and CABG, diabetic peripheral neuropathy, iron deficiency anemia, asthma who was admitted for increasing shortness of breath and bilateral lower extremity edema x2 weeks and admitted for HFpEF exacerbation.  Overnight 5/1, patient had increased wheeze and difficulty breathing which improved with neb treatment.   5/2: Volume overload increasing despite increased lasix. Echo ordered, Cardiology consulted.   A & P   Active Problems:   GERD without esophagitis   OSA (obstructive sleep apnea)   Coronary artery disease involving native coronary artery of native heart without angina pectoris   CKD (chronic kidney disease), stage III   Acute on chronic diastolic CHF (congestive heart failure) (HCC)   Type 2 diabetes mellitus with hyperglycemia, without long-term current use of insulin (HCC)   Asthma, mild intermittent   Acute on chronic diastolic (congestive) heart failure (HCC)   Alcoholic cirrhosis of liver with ascites (HCC)   Acute exacerbation of CHF (congestive heart failure) ([place])    1. Acute on chronic HFpEF exacerbation  a. Echo 07/2019: EF 55-60% with moderately dilated left atria and mod mitral calcification b. Weight trending down, 126 kg to 117 kg, output -8.8 L since admit c. Still has pitting edema of abdomen but improving d. Continue I/O and daily weights e. Continue lasix 120 mg IV BID f. Low sodium diet, fluid restriction g. Cardiology on board  2. Alcoholic Cirrhosis, not in acute decompensation a. On spironolactone as outpatient which was held on admission due to  AKI b. Spironolactone restarted by cardiology at reduced dose  3. AKI Likely cardio- renal which is improving with diuresis a. Has improved, continue to monitor  4. COVID-19 positive a. Off precautions as patient was diagnosed on 10/31/19 and still within 83 'positive test' window b. Discussed with infection prevention and ok to be off precautions  5. Wheeze Likely cardiac-wheeze from heart failure a. Improving but still wheezy b. Continue diuresis and continue PRN nebs and anoro ellipta  6. Acute on chronic anemia with history of PUD, chronic blood loss anemia and gastric angioectasias a. EGD 12/20/17 with 5 nonbleeding anbioectasias of the stomach treated with argon plasma coagulation and nonbleeding duodenal diverticula with Dr. Watt Climes [facility]) b. Patient was scheduled for a colonoscopy following his 2019 EGD but this was cancelled and was lost to follow up c. Hb has been stable between 7.0 and 7.7 not requiring transfusion d. Patient has consented to blood transfusion however I have chosen to hold off on PRBC transfusion at this time since the patient has massive volume overload and is asymptomatic from an anemia standpoint and Hb >7 e. If Hb <7.0 then would transfuse with lasix and consider GI consult  f. I have notified Dr. Therisa Doyne of this and she is going to have her office call the patient at discharge for follow up g. FOBT ordered but not done  7. Pancytopenia, probably from cirrhosis a. Peripheral smear with pancytopenia b. TSH unremarkable, HIV negative c. HCV screen negative in 2017 d. If this continues to worsen then will ask Heme/Onc to  see the patient  8. CAD with history of DES and CABG 1. Was taken off aspirin due to GI bleeding in the past and apparently taken off statin in the past by GI as well, but not sure why since his LFTs have been normal for years 2. Discussed with Dr. Therisa Doyne, [facility], who was unable to find a specific reason through GI notes as to why his statin  was stopped and did not see a contraindication for stopping it.  3. Will restart Lipitor 40 mg   9. Type 2 diabetes currently with sliding scale  10. Controlled mild admission asthma  11. GERD  12. Constipation 1. miralax and colace  13. OSA refusing CPAP  DVT prophylaxis: lovenox Family Communication: patient updated at bedside and stated that he does not need family updated Disposition Plan:  Status is: Inpatient  Remains inpatient appropriate because:IV treatments appropriate due to intensity of illness or inability to take PO and Inpatient level of care appropriate due to severity of illness Still needs IV lasix and volume management   Dispo: The patient is from: Home              Anticipated d/c is to: Home              Anticipated d/c date is: > 3 days              Patient currently is not medically stable to d/c.   Pressure injury documentation    None  Consultants  Cardiology  Procedures  None  Antibiotics   Anti-infectives (From admission, onward)   None        Subjective   Reports he is feeling better and having BMs. No shortness of breath, no complaints.   Objective   Vitals:   12/31/19 0555 12/31/19 0642 12/31/19 0810 12/31/19 1342  BP:  125/66  128/69  Pulse:  71  65  Resp:  18  (!) 21  Temp:  98.5 F (36.9 C)  98.2 F (36.8 C)  TempSrc:  Oral  Oral  SpO2:  98% 97% 98%  Weight: 117.6 kg     Height:        Intake/Output Summary (Last 24 hours) at 12/31/2019 1451 Last data filed at 12/31/2019 1432 Gross per 24 hour  Intake 2036 ml  Output 4375 ml  Net -2339 ml   Filed Weights   12/29/19 0615 12/30/19 0535 12/31/19 0555  Weight: 126.3 kg 126 kg 117.6 kg    Examination:  Physical Exam Vitals and nursing note reviewed.  Constitutional:      Appearance: Normal appearance. He is obese. He is not ill-appearing.  HENT:     Head: Normocephalic and atraumatic.  Eyes:     Conjunctiva/sclera: Conjunctivae normal.  Cardiovascular:      Rate and Rhythm: Normal rate and regular rhythm.  Pulmonary:     Effort: Pulmonary effort is normal.     Breath sounds: Wheezing present. No rales.  Abdominal:     General: Abdomen is flat.     Palpations: Abdomen is soft.     Comments: Abdominal edema below umbilicus  Musculoskeletal:        General: No swelling or tenderness.     Right lower leg: Edema present.     Left lower leg: Edema present.     Comments: compression stockings in place  Skin:    Coloration: Skin is not jaundiced or pale.  Neurological:     Mental Status: He is alert.  Mental status is at baseline.  Psychiatric:        Mood and Affect: Mood normal.        Behavior: Behavior normal.     Data Reviewed: I have personally reviewed following labs and imaging studies  CBC: Recent Labs  Lab 12/26/19 2004 12/26/19 2004 12/28/19 8182 12/28/19 0611 12/28/19 1327 12/29/19 0506 12/29/19 1046 12/30/19 0556 12/31/19 0550  WBC 4.2  --  3.4*  --   --  3.1*  --  3.3* 3.7*  NEUTROABS  --   --  2.4  --   --   --   --   --   --   HGB 7.5*   < > 7.2*   < > 7.2* 7.0* 7.7* 7.3* 7.5*  HCT 27.1*   < > 27.1*   < > 26.1* 25.5* 27.7* 26.6* 27.1*  MCV 83.4  --  83.9  --   --  83.3  --  83.1 83.1  PLT 125*  --  122*  --   --  114*  --  113* 129*   < > = values in this interval not displayed.   Basic Metabolic Panel: Recent Labs  Lab 12/26/19 2004 12/27/19 1652 12/28/19 0611 12/29/19 0506 12/30/19 0556 12/31/19 0550  NA 139  --  138 135 137 138  K 3.9  --  3.7 3.8 3.8 4.0  CL 106  --  103 102 103 101  CO2 23  --  25 26 27 28   GLUCOSE 146*  --  106* 137* 112* 103*  BUN 26*  --  26* 26* 26* 25*  CREATININE 1.24  --  1.31* 1.22 1.23 1.02  CALCIUM 8.7*  --  8.7* 8.3* 8.4* 8.5*  MG  --  2.3  --  2.2 2.3  --    GFR: Estimated Creatinine Clearance: 80.4 mL/min (by C-G formula based on SCr of 1.02 mg/dL). Liver Function Tests: Recent Labs  Lab 12/31/19 0550  AST 25  ALT 18  ALKPHOS 93  BILITOT 0.8  PROT 7.1   ALBUMIN 3.8   No results for input(s): LIPASE, AMYLASE in the last 168 hours. No results for input(s): AMMONIA in the last 168 hours. Coagulation Profile: No results for input(s): INR, PROTIME in the last 168 hours. Cardiac Enzymes: No results for input(s): CKTOTAL, CKMB, CKMBINDEX, TROPONINI in the last 168 hours. BNP (last 3 results) No results for input(s): PROBNP in the last 8760 hours. HbA1C: No results for input(s): HGBA1C in the last 72 hours. CBG: Recent Labs  Lab 12/30/19 1137 12/30/19 1643 12/30/19 2105 12/31/19 0722 12/31/19 1142  GLUCAP 119* 130* 131* 94 129*   Lipid Profile: No results for input(s): CHOL, HDL, LDLCALC, TRIG, CHOLHDL, LDLDIRECT in the last 72 hours. Thyroid Function Tests: No results for input(s): TSH, T4TOTAL, FREET4, T3FREE, THYROIDAB in the last 72 hours. Anemia Panel: No results for input(s): VITAMINB12, FOLATE, FERRITIN, TIBC, IRON, RETICCTPCT in the last 72 hours. Sepsis Labs: No results for input(s): PROCALCITON, LATICACIDVEN in the last 168 hours.  Recent Results (from the past 240 hour(s))  Respiratory Panel by RT PCR (Flu A&B, Covid) - Nasopharyngeal Swab     Status: Abnormal   Collection Time: 12/27/19  4:22 AM   Specimen: Nasopharyngeal Swab  Result Value Ref Range Status   SARS Coronavirus 2 by RT PCR POSITIVE (A) NEGATIVE Final    Comment: RESULT CALLED TO, READ BACK BY AND VERIFIED WITH: FREAKEY,J. RN AT 9937 12/27/19 MULLINS,T (NOTE) SARS-CoV-2 target  nucleic acids are DETECTED. SARS-CoV-2 RNA is generally detectable in upper respiratory specimens  during the acute phase of infection. Positive results are indicative of the presence of the identified virus, but do not rule out bacterial infection or co-infection with other pathogens not detected by the test. Clinical correlation with patient history and other diagnostic information is necessary to determine patient infection status. The expected result is Negative. Fact  Sheet for Patients:  PinkCheek.be Fact Sheet for Healthcare Providers: GravelBags.it This test is not yet approved or cleared by the [place] FDA and  has been authorized for detection and/or diagnosis of SARS-CoV-2 by FDA under an Emergency Use Authorization (EUA).  This EUA will remain in effect (meaning this test can be [place] ed) for the duration of  the COVID-19 declaration under Section 564(b)(1) of the Act, 21 U.S.C. section 360bbb-3(b)(1), unless the authorization is terminated or revoked sooner.    Influenza A by PCR NEGATIVE NEGATIVE Final   Influenza B by PCR NEGATIVE NEGATIVE Final    Comment: (NOTE) The Xpert Xpress SARS-CoV-2/FLU/RSV assay is intended as an aid in  the diagnosis of influenza from Nasopharyngeal swab specimens and  should not be used as a sole basis for treatment. Nasal washings and  aspirates are unacceptable for Xpert Xpress SARS-CoV-2/FLU/RSV  testing. Fact Sheet for Patients: PinkCheek.be Fact Sheet for Healthcare Providers: GravelBags.it This test is not yet approved or cleared by the [place] FDA and  has been authorized for detection and/or diagnosis of SARS-CoV-2 by  FDA under an Emergency Use Authorization (EUA). This EUA will remain  in effect (meaning this test can be used) for the duration of the  Covid-19 declaration under Section 564(b)(1) of the Act, 21  U.S.C. section 360bbb-3(b)(1), unless the authorization is  terminated or revoked. Performed at [facility], [place] [address], [place], [place] [address]          Radiology Studies: No results found.      Scheduled Meds: . atorvastatin  40 mg Oral Daily  . docusate sodium  100 mg Oral Daily  . enoxaparin (LOVENOX) injection  40 mg Subcutaneous Q24H  . gabapentin  100 mg Oral BID  . insulin aspart  0-15 Units Subcutaneous TID AC & HS    . Living Better with Heart Failure Book   Does not apply Once  . polyethylene glycol  17 g Oral Daily  . potassium chloride  20 mEq Oral BID  . sodium chloride flush  3 mL Intravenous Q12H  . spironolactone  25 mg Oral Daily  . umeclidinium-vilanterol  1 puff Inhalation Daily   Continuous Infusions: . sodium chloride    . furosemide 120 mg (12/31/19 7096)     Time spent: 28 minutes with over 50% of the time coordinating the patient's care    Harold Hedge, DO [facility] Pager [phone]  Call night coverage person covering after 7pm

## 2019-12-31 NOTE — Progress Notes (Signed)
Pt is sleeping in the recliner with legs elevated

## 2019-12-31 NOTE — Progress Notes (Signed)
Patient continues to decline nocturnal CPAP.

## 2019-12-31 NOTE — TOC Progression Note (Addendum)
Transition of Care [facility]) - Progression Note    Patient Details  Name: ARVEL OQUINN MRN: [id number] Date of Birth: [birth year]  [facility] CM/SW Contact  Joaquin Courts, RN Phone Number: 12/31/2019, 11:58 AM  Clinical Narrative:    CM spoke with patient at bedside.   Patient reports he is pretty independent at home and has a walker which he has not needed to use.  Patient also has a scale which he reports he occasionally forgets to use.   Patient set up with [facility] for CHF management.      Expected Discharge Plan: [place] Barriers to Discharge: Continued Medical Work up  Expected Discharge Plan and Services Expected Discharge Plan: [place]   Discharge Planning Services: CM Consult   Living arrangements for the past 2 months: Single Family Home                           HH Arranged: RN [facility] Agency: [place] Date [place]: 12/31/19 Time [place]: [id number] Representative spoke with at [place]: [place] Interventions    Readmission Risk Interventions Readmission Risk Prevention Plan 12/30/2019 08/01/2019  Transportation Screening Complete Complete  PCP or Specialist Appt within 5-7 Days - Not Complete  Not Complete comments - disposition pending  Home Care Screening - Complete  Medication Review (RN CM) - Complete  Palliative Care Screening Not Applicable -  Medication Review (RN Care Manager) Complete -  Some recent data might be hidden

## 2019-12-31 NOTE — Care Management Important Message (Signed)
Important Message  Patient Details IM Letter given to Roque Lias SW Case Manager to present to the Patient Name: [name] MRN: [id number] Date of Birth: 01-11-1946   Medicare Important Message Given:  Yes     Kerin Salen 12/31/2019, 12:31 PM

## 2019-12-31 NOTE — Progress Notes (Signed)
Progress Note  Patient Name: [name] Date of Encounter: 12/31/2019  Primary Cardiologist: Mertie Moores, MD  Subjective   Feeling well, no complaints. No SOB. Patient does relay he is unable to read so need to keep this in mind for discharge planning/instructions.  Inpatient Medications    Scheduled Meds: . docusate sodium  100 mg Oral Daily  . enoxaparin (LOVENOX) injection  40 mg Subcutaneous Q24H  . gabapentin  100 mg Oral BID  . insulin aspart  0-15 Units Subcutaneous TID AC & HS  . Living Better with Heart Failure Book   Does not apply Once  . polyethylene glycol  17 g Oral Daily  . potassium chloride  20 mEq Oral BID  . sodium chloride flush  3 mL Intravenous Q12H  . umeclidinium-vilanterol  1 puff Inhalation Daily   Continuous Infusions: . sodium chloride    . furosemide 120 mg (12/31/19 0812)   PRN Meds: sodium chloride, acetaminophen, albuterol, albuterol, nitroGLYCERIN, ondansetron (ZOFRAN) IV, sodium chloride flush   Vital Signs    Vitals:   12/30/19 2103 12/31/19 0555 12/31/19 0642 12/31/19 0810  BP: (!) 141/58  125/66   Pulse: 66  71   Resp: 16  18   Temp: 98 F (36.7 C)  98.5 F (36.9 C)   TempSrc:   Oral   SpO2: 100%  98% 97%  Weight:  117.6 kg    Height:        Intake/Output Summary (Last 24 hours) at 12/31/2019 0953 Last data filed at 12/31/2019 3762 Gross per 24 hour  Intake 2115 ml  Output 3685 ml  Net -1570 ml   Last 3 Weights 12/31/2019 12/30/2019 12/29/2019  Weight (lbs) 259 lb 4.2 oz 277 lb 12.5 oz 278 lb 7.1 oz  Weight (kg) 117.6 kg 126 kg 126.3 kg     Telemetry    NSR - Personally Reviewed  Physical Exam   GEN: No acute distress.  HEENT: Normocephalic, atraumatic, sclera non-icteric. Neck: No JVD or bruits. Cardiac: RRR no murmurs, rubs, or gallops.  Radials/DP/PT 1+ and equal bilaterally.  Respiratory: Clear to auscultation bilaterally. Breathing is unlabored. GI: Soft, nontender, non-distended, BS +x 4. MS: no  deformity. Extremities: No clubbing or cyanosis. Severe bilateral LE edema, 3+ from pedal range up to thighs and abdomen.Distal pedal pulses are challenging given edema but extremities are warm Neuro:  AAOx3. Follows commands. Psych:  Responds to questions appropriately with a normal affect.  Labs    High Sensitivity Troponin:   Recent Labs  Lab 12/26/19 2004  TROPONINIHS 9      Cardiac EnzymesNo results for input(s): TROPONINI in the last 168 hours. No results for input(s): TROPIPOC in the last 168 hours.   Chemistry Recent Labs  Lab 12/29/19 0506 12/30/19 0556 12/31/19 0550  NA 135 137 138  K 3.8 3.8 4.0  CL 102 103 101  CO2 26 27 28   GLUCOSE 137* 112* 103*  BUN 26* 26* 25*  CREATININE 1.22 1.23 1.02  CALCIUM 8.3* 8.4* 8.5*  PROT  --   --  7.1  ALBUMIN  --   --  3.8  AST  --   --  25  ALT  --   --  18  ALKPHOS  --   --  93  BILITOT  --   --  0.8  GFRNONAA 58* 58* >60  GFRAA >60 >60 >60  ANIONGAP 7 7 9      Hematology Recent Labs  Lab 12/29/19 [phone]  12/29/19 0506 12/29/19 1046 12/30/19 0556 12/31/19 0550  WBC 3.1*  --   --  3.3* 3.7*  RBC 3.06*  --   --  3.20* 3.26*  HGB 7.0*   < > 7.7* 7.3* 7.5*  HCT 25.5*   < > 27.7* 26.6* 27.1*  MCV 83.3  --   --  83.1 83.1  MCH 22.9*  --   --  22.8* 23.0*  MCHC 27.5*  --   --  27.4* 27.7*  RDW 17.1*  --   --  17.0* 16.9*  PLT 114*  --   --  113* 129*   < > = values in this interval not displayed.    BNP Recent Labs  Lab 12/26/19 2012  BNP 266.2*     DDimer No results for input(s): DDIMER in the last 168 hours.   Radiology    No results found.  Cardiac Studies   2D Echo 07/2019  1. Left ventricular ejection fraction, by visual estimation, is 55 to  60%. The left ventricle has normal function. There is no left ventricular  hypertrophy.  2. Abnormal septal motion.  3. Global right ventricle has normal systolic function.The right  ventricular size is normal. No increase in right ventricular wall   thickness.  4. Left atrial size was moderately dilated.  5. Right atrial size was normal.  6. Moderate calcification of the mitral valve leaflet(s).  7. Moderate mitral annular calcification.  8. Moderate thickening of the mitral valve leaflet(s).  9. The mitral valve is normal in structure. Trace mitral valve  regurgitation. No evidence of mitral stenosis.  10. The tricuspid valve is normal in structure. Tricuspid valve  regurgitation is trivial.  11. The aortic valve is tricuspid. Aortic valve regurgitation is not  visualized. sclerosis without stenosis.  12. There is Moderate thickening of the aortic valve.  13. There is Severe calcifcation of the aortic valve.  14. The pulmonic valve was normal in structure. Pulmonic valve  regurgitation is trivial.  15. Moderately elevated pulmonary artery systolic pressure.  16. The inferior vena cava is normal in size with greater than 50%   Patient Profile     74 y.o. male with CAD s/p prior PCI 2013 and CABG 3500, chronic diastolic CHF, [id number]-[id number] infection 10/2019, chronic back pain, asthma, arthritis, hepatic cirrhosis, prior GIB, gout, HLD, GERD, DM, HTN, chronic anemia who presented to [facility] with increased LEE. Also reported some SOB although not primary complaint. Unclear dry weight but pt appeared at least 30-40lb up from prior values.  Assessment & Plan    1. Acute on chronic diastolic CHF - continues to diurese on high dose Lasix 120mg  BID. Weights are confusing but finally downtrending today. I/O's reflective of steady diuresis. Albumin normal. Continue current dose of IV Lasix and oral potassium. Reviewed 2g sodium restriction, 2L fluid restriction, daily weights with patient. CHF book rx'd but patient unable to read so need to keep in mind for discharge education. Spironolactone remains on hold, pending review by MD - was on this as outpatient (hepatic cirrhosis). If this is resumed, may need to scale back potassium. Given degree of  edema anticipate he will need several more days of diuresis.  2. CAD - no chest pain complaints. hsTroponin is negative. No further workup planned at present time. Aspirin previously stopped in 11/2018 due to GI bleeding requiring transfusion. Patient states he was given the choice whether to go back on eventually or stay off and he prefers to stay off. Statin  also discontinued at that time, not specifically quoted why. Appreciate input from IM whether OK to resume at some point.  3. AKI superimposed on suspected CKD stage II-III - peak Cr 1.31, remains table at this time.  4. Anemia with pancytopenia - slight worsening of Hgb this admission, also with decreased WBC/platelets. Per primary team.  5. OSA - patient refuses CPAP.  6. Asthma - quiet expiratory wheezing noted. Also mentioned in IM notes. May be due in part to cardiac wheezing. Appreciate primary team's management of asthma.   For questions or updates, please contact [place] Please consult [email or web address] for contact info under Cardiology/STEMI.  Signed, Charlie Pitter, PA-C 12/31/2019, 9:53 AM

## 2019-12-31 NOTE — Plan of Care (Signed)
?  Problem: Clinical Measurements: ?Goal: Ability to maintain clinical measurements within normal limits will improve ?Outcome: Progressing ?Goal: Will remain free from infection ?Outcome: Progressing ?Goal: Diagnostic test results will improve ?Outcome: Progressing ?  ?

## 2020-01-01 DIAGNOSIS — I2510 Atherosclerotic heart disease of native coronary artery without angina pectoris: Secondary | ICD-10-CM | POA: Diagnosis not present

## 2020-01-01 DIAGNOSIS — N1831 Chronic kidney disease, stage 3a: Secondary | ICD-10-CM | POA: Diagnosis not present

## 2020-01-01 DIAGNOSIS — I509 Heart failure, unspecified: Secondary | ICD-10-CM

## 2020-01-01 DIAGNOSIS — I5033 Acute on chronic diastolic (congestive) heart failure: Secondary | ICD-10-CM | POA: Diagnosis not present

## 2020-01-01 LAB — BASIC METABOLIC PANEL
Anion gap: 9 (ref 5–15)
BUN: 29 mg/dL — ABNORMAL HIGH (ref 8–23)
CO2: 28 mmol/L (ref 22–32)
Calcium: 8.4 mg/dL — ABNORMAL LOW (ref 8.9–10.3)
Chloride: 100 mmol/L (ref 98–111)
Creatinine, Ser: 1.37 mg/dL — ABNORMAL HIGH (ref 0.61–1.24)
GFR calc Af Amer: 59 mL/min — ABNORMAL LOW (ref >60)
GFR calc non Af Amer: 51 mL/min — ABNORMAL LOW (ref >60)
Glucose, Bld: 106 mg/dL — ABNORMAL HIGH (ref 70–99)
Potassium: 4.3 mmol/L (ref 3.5–5.1)
Sodium: 137 mmol/L (ref 135–145)

## 2020-01-01 LAB — GLUCOSE, CAPILLARY
Glucose-Capillary: 90 mg/dL (ref 70–99)
Glucose-Capillary: 157 mg/dL — ABNORMAL HIGH (ref 70–99)
Glucose-Capillary: 101 mg/dL — ABNORMAL HIGH (ref 70–99)
Glucose-Capillary: 185 mg/dL — ABNORMAL HIGH (ref 70–99)

## 2020-01-01 LAB — CBC
HCT: 26.4 % — ABNORMAL LOW (ref 39.0–52.0)
Hemoglobin: 7.2 g/dL — ABNORMAL LOW (ref 13.0–17.0)
MCH: 22.6 pg — ABNORMAL LOW (ref 26.0–34.0)
MCHC: 27.3 g/dL — ABNORMAL LOW (ref 30.0–36.0)
MCV: 82.8 fL (ref 80.0–100.0)
Platelets: 117 10*3/uL — ABNORMAL LOW (ref 150–400)
RBC: 3.19 MIL/uL — ABNORMAL LOW (ref 4.22–5.81)
RDW: 16.5 % — ABNORMAL HIGH (ref 11.5–15.5)
WBC: 3.7 10*3/uL — ABNORMAL LOW (ref 4.0–10.5)
nRBC: 0 % (ref 0.0–0.2)

## 2020-01-01 NOTE — Progress Notes (Signed)
Spoke with pt regarding nocturnal cpap.  Pt stated he doesn't need it and does not want to wear it tonight.  Pt was advised that RT is available all night should he change his mind.

## 2020-01-01 NOTE — Progress Notes (Signed)
Progress Note  Patient Name: [name] Date of Encounter: 01/01/2020  Primary Cardiologist: Mertie Moores, MD  Subjective   No new complaints. Continues to feel pretty good. Legs still really edematous but making progress (marginally softer today). Encouraged mobilization. Did well with PT yesterday.  Inpatient Medications    Scheduled Meds: . atorvastatin  40 mg Oral q1800  . docusate sodium  100 mg Oral Daily  . enoxaparin (LOVENOX) injection  40 mg Subcutaneous Q24H  . gabapentin  100 mg Oral BID  . insulin aspart  0-15 Units Subcutaneous TID AC & HS  . Living Better with Heart Failure Book   Does not apply Once  . polyethylene glycol  17 g Oral Daily  . potassium chloride  20 mEq Oral BID  . sodium chloride flush  3 mL Intravenous Q12H  . spironolactone  25 mg Oral Daily  . umeclidinium-vilanterol  1 puff Inhalation Daily   Continuous Infusions: . sodium chloride    . furosemide 120 mg (01/01/20 0845)   PRN Meds: sodium chloride, acetaminophen, albuterol, albuterol, nitroGLYCERIN, ondansetron (ZOFRAN) IV, sodium chloride flush   Vital Signs    Vitals:   12/31/19 2022 01/01/20 0500 01/01/20 0507 01/01/20 0903  BP: 125/60  (!) 116/58   Pulse: 65  66   Resp: 16  18   Temp: 97.9 F (36.6 C)  98.2 F (36.8 C)   TempSrc:      SpO2: 98%  96% 92%  Weight:  122.2 kg    Height:        Intake/Output Summary (Last 24 hours) at 01/01/2020 0909 Last data filed at 01/01/2020 0700 Gross per 24 hour  Intake 1075 ml  Output 4725 ml  Net -3650 ml   Last 3 Weights 01/01/2020 12/31/2019 12/30/2019  Weight (lbs) 269 lb 6.4 oz 259 lb 4.2 oz 277 lb 12.5 oz  Weight (kg) 122.2 kg 117.6 kg 126 kg     Telemetry    NSR first degree AVB - Personally Reviewed   Physical Exam   GEN: No acute distress, obese WM.  HEENT: Normocephalic, atraumatic, sclera non-icteric. Neck: No JVD or bruits. Cardiac: RRR no murmurs, rubs, or gallops.  Radials/DP/PT 1+ and equal bilaterally.    Respiratory: Clear to auscultation bilaterally. Breathing is unlabored. GI: Soft, nontender, non-distended, BS +x 4. MS: no deformity. Extremities: No clubbing or cyanosis. Marked bilateral lower extremity edema, 2-3+ from pedal range to thighs and abdomen - marginally softer than yesterday Neuro:  AAOx3. Follows commands. Psych:  Responds to questions appropriately with a normal affect.  Labs    High Sensitivity Troponin:   Recent Labs  Lab 12/26/19 2004  TROPONINIHS 9      Cardiac EnzymesNo results for input(s): TROPONINI in the last 168 hours. No results for input(s): TROPIPOC in the last 168 hours.   Chemistry Recent Labs  Lab 12/30/19 0556 12/31/19 0550 01/01/20 0539  NA 137 138 137  K 3.8 4.0 4.3  CL 103 101 100  CO2 27 28 28   GLUCOSE 112* 103* 106*  BUN 26* 25* 29*  CREATININE 1.23 1.02 1.37*  CALCIUM 8.4* 8.5* 8.4*  PROT  --  7.1  --   ALBUMIN  --  3.8  --   AST  --  25  --   ALT  --  18  --   ALKPHOS  --  93  --   BILITOT  --  0.8  --   GFRNONAA 58* >60 51*  GFRAA >  60 >60 59*  ANIONGAP 7 9 9      Hematology Recent Labs  Lab 12/30/19 0556 12/31/19 0550 01/01/20 0539  WBC 3.3* 3.7* 3.7*  RBC 3.20* 3.26* 3.19*  HGB 7.3* 7.5* 7.2*  HCT 26.6* 27.1* 26.4*  MCV 83.1 83.1 82.8  MCH 22.8* 23.0* 22.6*  MCHC 27.4* 27.7* 27.3*  RDW 17.0* 16.9* 16.5*  PLT 113* 129* 117*    BNP Recent Labs  Lab 12/26/19 2012  BNP 266.2*     DDimer No results for input(s): DDIMER in the last 168 hours.   Radiology    No results found.  Cardiac Studies   2D Echo 07/2019  1. Left ventricular ejection fraction, by visual estimation, is 55 to  60%. The left ventricle has normal function. There is no left ventricular  hypertrophy.  2. Abnormal septal motion.  3. Global right ventricle has normal systolic function.The right  ventricular size is normal. No increase in right ventricular wall  thickness.  4. Left atrial size was moderately dilated.  5. Right  atrial size was normal.  6. Moderate calcification of the mitral valve leaflet(s).  7. Moderate mitral annular calcification.  8. Moderate thickening of the mitral valve leaflet(s).  9. The mitral valve is normal in structure. Trace mitral valve  regurgitation. No evidence of mitral stenosis.  10. The tricuspid valve is normal in structure. Tricuspid valve  regurgitation is trivial.  11. The aortic valve is tricuspid. Aortic valve regurgitation is not  visualized. sclerosis without stenosis.  12. There is Moderate thickening of the aortic valve.  13. There is Severe calcifcation of the aortic valve.  14. The pulmonic valve was normal in structure. Pulmonic valve  regurgitation is trivial.  15. Moderately elevated pulmonary artery systolic pressure.  16. The inferior vena cava is normal in size with greater than 50%   Patient Profile     74 y.o.malewith CAD s/p prior PCI 2013 and CABG 3818, chronic diastolic CHF, EXHBZ-16 infection 10/2019, chronic back pain, asthma, arthritis, hepatic cirrhosis, prior GIB, gout, HLD, GERD, DM, HTN, chronic anemia who presented to [facility] with increased LEE. Also reported some SOB although not primary complaint. Unclear dry weight but pt appeared at least 30-40lb up from prior values.  Assessment & Plan    1. Acute on chronic diastolic CHF - continues to diurese on high dose Lasix 120mg  BID. Weights have been very confusing and inconsistent this admission but I/O's are reflective of steady diuresis. Albumin is normal. Spironolactone resumed at 25mg  daily yesterday. Watch Cr, K. Reviewed 2g sodium restriction, 2L fluid restriction, daily weights with patient on 5/3. Remains with significant swelling so will continue current regimen as renal function allows.  2. CAD -no chest pain complaints. hsTroponin is negative.No further workup planned at present time. Aspirin previously stopped in 11/2018 due to GI bleeding requiring transfusion. Patient states he was  given the choice whether to go back on and he declined to resume. Statin also discontinued at that time, not specifically quoted why. IM has resumed this admission. Will need OP f/u of liver/lipids.  3. CKD stage III - per further review, his most recent creatinines have fluctuated from 1.1-1.4. So despite slight increase compared to yesterday, still relatively similar to historical fluctuations. Continue to monitor.  4. Anemia with pancytopenia - slight worsening of Hgb this admission, also with decreased WBC/platelets. Per primary team.  5. OSA - patient unfortunately refuses CPAP.  6. Asthma - Intermittent wheezing noted, likely multifactorial. This has improved.  On 12/31/19, patient stated he cannot read, so need to keep in mind for discharge education.  For questions or updates, please contact [place] Please consult [email or web address] for contact info under Cardiology/STEMI.  Signed, Charlie Pitter, PA-C 01/01/2020, 9:09 AM

## 2020-01-01 NOTE — Progress Notes (Signed)
PROGRESS NOTE    ZIV WELCHEL    Code Status: Full Code  JFH:[id number] DOB: 1946/07/10 DOA: 12/26/2019 LOS: 5 days  PCP: [name], MD CC:  Chief Complaint  Patient presents with  . Shortness of Breath       Hospital Summary   [name] is a 74 y.o. male with a history of HFpEF, CKD 3, type 2 diabetes, cirrhosis, COVID-19 infection diagnosed 10/31/2019, CAD status post DES x2 and CABG, diabetic peripheral neuropathy, iron deficiency anemia, asthma who was admitted for increasing shortness of breath and bilateral lower extremity edema x2 weeks and admitted for HFpEF exacerbation.  Overnight 5/1, patient had increased wheeze and difficulty breathing which improved with neb treatment.   5/2: Volume overload increasing despite increased lasix. Echo ordered, Cardiology consulted.   A & P   Active Problems:   GERD without esophagitis   OSA (obstructive sleep apnea)   Coronary artery disease involving native coronary artery of native heart without angina pectoris   CKD (chronic kidney disease), stage III   Acute on chronic diastolic CHF (congestive heart failure) (HCC)   Type 2 diabetes mellitus with hyperglycemia, without long-term current use of insulin (HCC)   Asthma, mild intermittent   Acute on chronic diastolic (congestive) heart failure (HCC)   Alcoholic cirrhosis of liver with ascites (HCC)   Acute exacerbation of CHF (congestive heart failure) ([place])    1. Acute on chronic HFpEF exacerbation  a. Echo 07/2019: EF 55-60% with moderately dilated left atria and mod mitral calcification b. Weight trending down, 126 kg to 122 kg, output -11.2 L since admit c. Still has pitting edema of abdomen but improving d. Continue I/O and daily weights e. ON lasix 120 mg IV BID, Aldactone 25 daily f. Low sodium diet, fluid restriction g. Cardiology on board   2. Alcoholic Cirrhosis, not in acute decompensation a. On spironolactone as outpatient which was held on  admission due to AKI b. Spironolactone restarted by cardiology at reduced dose  3. AKI Likely cardio- renal which is improving with diuresis a. Monitor creatinine trends--seems to have steadily increased over the past several days  4. COVID-19 positive a. Off precautions as patient was diagnosed on 10/31/19 and still within 35 'positive test' window b. Discussed with infection prevention and ok to be off precautions  5. Wheeze Likely cardiac-wheeze from heart failure a. Improving but still wheezy b. Continue diuresis and continue PRN nebs and anoro ellipta  6. Acute on chronic anemia with history of PUD, chronic blood loss anemia and gastric angioectasias a. EGD 12/20/17 with 5 nonbleeding anbioectasias of the stomach treated with argon plasma coagulation and nonbleeding duodenal diverticula with Dr. [facility]) b. Patient was scheduled for a colonoscopy following his 2019 EGD but this was cancelled and was lost to follow up c. Hb has been stable between 7.0 and 7.7 not requiring transfusion--continue to hold off on PRBC transfusion at 2/2 massive volume overload and is asymptomatic from an anemia standpoint and Hb >7 d. If Hb <7.0 then would transfuse with lasix and consider GI consult  e. I have notified Dr. Therisa Doyne of this and she is going to have her office call the patient at discharge for follow up f. FOBT ordered but not done g. obtain iron and transferrin and give IBV iron in am  7. Pancytopenia, probably from cirrhosis a. Peripheral smear with pancytopenia b. TSH unremarkable, HIV negative c. HCV screen negative in 2017  8. CAD with history  of DES and CABG 1. Was taken off aspirin due to GI bleeding in the past and apparently taken off statin in the past by GI as well, but not sure why since his LFTs have been normal for years 2. Discussed with Dr. Therisa Doyne, [facility], who was unable to find a specific reason through GI notes as to why his statin was stopped and did not see a  contraindication for stopping it.  3. Will restart Lipitor 40 mg   9. Type 2 diabetes currently with sliding scale  10. Controlled mild admission asthma  11. GERD  12. Constipation 1. miralax and colace  13. OSA refusing CPAP as he states that he cannot use machine at home as it knocks out his power and his wife uses  DVT prophylaxis: lovenox Family Communication: patient updated at bedside  Disposition Plan:  Status is: Inpatient  Remains inpatient appropriate because:IV treatments appropriate due to intensity of illness or inability to take PO and Inpatient level of care appropriate due to severity of illness Still needs IV lasix and volume management   Dispo: The patient is from: Home              Anticipated d/c is to: Home              Anticipated d/c date is: > 3 days              Patient currently is not medically stable to d/c.   Pressure injury documentation    None  Consultants  Cardiology  Procedures  None  Antibiotics   Anti-infectives (From admission, onward)   None        Subjective  Patient feels better walked around the unit and has been doing well overall No bleeding no nausea no vomiting Still feels overall swollen though  Objective   Vitals:   12/31/19 2022 01/01/20 0500 01/01/20 0507 01/01/20 0903  BP: 125/60  (!) 116/58   Pulse: 65  66   Resp: 16  18   Temp: 97.9 F (36.6 C)  98.2 F (36.8 C)   TempSrc:      SpO2: 98%  96% 92%  Weight:  122.2 kg    Height:        Intake/Output Summary (Last 24 hours) at 01/01/2020 1115 Last data filed at 01/01/2020 1046 Gross per 24 hour  Intake 776 ml  Output 4225 ml  Net -3449 ml   Filed Weights   12/30/19 0535 12/31/19 0555 01/01/20 0500  Weight: 126 kg 117.6 kg 122.2 kg    Examination:  Physical Exam Vitals and nursing note reviewed.  Constitutional:      Appearance: Normal appearance. He is obese. He is not ill-appearing.  HENT:     Head: Normocephalic and atraumatic.  Eyes:       Conjunctiva/sclera: Conjunctivae normal.  Cardiovascular:     Rate and Rhythm: Normal rate and regular rhythm.  Pulmonary:     Effort: Pulmonary effort is normal.     Breath sounds: Wheezing present. No rales.  Abdominal:     General: Abdomen is flat.     Palpations: Abdomen is soft.     Comments: Abdominal edema below umbilicus  Musculoskeletal:        General: No swelling or tenderness.     Right lower leg: Edema present.     Left lower leg: Edema present.     Comments: compression stockings in place  Skin:    Coloration: Skin is not jaundiced  or pale.  Neurological:     Mental Status: He is alert. Mental status is at baseline.  Psychiatric:        Mood and Affect: Mood normal.        Behavior: Behavior normal.     Data Reviewed: I have personally reviewed following labs and imaging studies  CBC: Recent Labs  Lab 12/28/19 0611 12/28/19 1327 12/29/19 0506 12/29/19 1046 12/30/19 0556 12/31/19 0550 01/01/20 0539  WBC 3.4*  --  3.1*  --  3.3* 3.7* 3.7*  NEUTROABS 2.4  --   --   --   --   --   --   HGB 7.2*   < > 7.0* 7.7* 7.3* 7.5* 7.2*  HCT 27.1*   < > 25.5* 27.7* 26.6* 27.1* 26.4*  MCV 83.9  --  83.3  --  83.1 83.1 82.8  PLT 122*  --  114*  --  113* 129* 117*   < > = values in this interval not displayed.   Basic Metabolic Panel: Recent Labs  Lab 12/26/19 2004 12/27/19 1652 12/28/19 9381 12/29/19 0506 12/30/19 0556 12/31/19 0550 01/01/20 0539  NA   < >  --  138 135 137 138 137  K   < >  --  3.7 3.8 3.8 4.0 4.3  CL   < >  --  103 102 103 101 100  CO2   < >  --  25 26 27 28 28   GLUCOSE   < >  --  106* 137* 112* 103* 106*  BUN   < >  --  26* 26* 26* 25* 29*  CREATININE   < >  --  1.31* 1.22 1.23 1.02 1.37*  CALCIUM   < >  --  8.7* 8.3* 8.4* 8.5* 8.4*  MG  --  2.3  --  2.2 2.3  --   --    < > = values in this interval not displayed.   GFR: Estimated Creatinine Clearance: 61.1 mL/min (A) (by C-G formula based on SCr of 1.37 mg/dL (H)). Liver  Function Tests: Recent Labs  Lab 12/31/19 0550  AST 25  ALT 18  ALKPHOS 93  BILITOT 0.8  PROT 7.1  ALBUMIN 3.8   No results for input(s): LIPASE, AMYLASE in the last 168 hours. No results for input(s): AMMONIA in the last 168 hours. Coagulation Profile: No results for input(s): INR, PROTIME in the last 168 hours. Cardiac Enzymes: No results for input(s): CKTOTAL, CKMB, CKMBINDEX, TROPONINI in the last 168 hours. BNP (last 3 results) No results for input(s): PROBNP in the last 8760 hours. HbA1C: No results for input(s): HGBA1C in the last 72 hours. CBG: Recent Labs  Lab 12/31/19 0722 12/31/19 1142 12/31/19 1620 12/31/19 2023 01/01/20 0726  GLUCAP 94 129* 111* 119* 90   Lipid Profile: No results for input(s): CHOL, HDL, LDLCALC, TRIG, CHOLHDL, LDLDIRECT in the last 72 hours. Thyroid Function Tests: No results for input(s): TSH, T4TOTAL, FREET4, T3FREE, THYROIDAB in the last 72 hours. Anemia Panel: No results for input(s): VITAMINB12, FOLATE, FERRITIN, TIBC, IRON, RETICCTPCT in the last 72 hours. Sepsis Labs: No results for input(s): PROCALCITON, LATICACIDVEN in the last 168 hours.  Recent Results (from the past 240 hour(s))  Respiratory Panel by RT PCR (Flu A&B, Covid) - Nasopharyngeal Swab     Status: Abnormal   Collection Time: 12/27/19  4:22 AM   Specimen: Nasopharyngeal Swab  Result Value Ref Range Status   SARS Coronavirus 2 by RT PCR POSITIVE (  A) NEGATIVE Final    Comment: RESULT CALLED TO, READ BACK BY AND VERIFIED WITH: FREAKEY,J. RN AT 2683 12/27/19 MULLINS,T (NOTE) SARS-CoV-2 target nucleic acids are DETECTED. SARS-CoV-2 RNA is generally detectable in upper respiratory specimens  during the acute phase of infection. Positive results are indicative of the presence of the identified virus, but do not rule out bacterial infection or co-infection with other pathogens not detected by the test. Clinical correlation with patient history and other diagnostic  information is necessary to determine patient infection status. The expected result is Negative. Fact Sheet for Patients:  PinkCheek.be Fact Sheet for Healthcare Providers: GravelBags.it This test is not yet approved or cleared by the [place] FDA and  has been authorized for detection and/or diagnosis of SARS-CoV-2 by FDA under an Emergency Use Authorization (EUA).  This EUA will remain in effect (meaning this test can be [place] ed) for the duration of  the COVID-19 declaration under Section 564(b)(1) of the Act, 21 U.S.C. section 360bbb-3(b)(1), unless the authorization is terminated or revoked sooner.    Influenza A by PCR NEGATIVE NEGATIVE Final   Influenza B by PCR NEGATIVE NEGATIVE Final    Comment: (NOTE) The Xpert Xpress SARS-CoV-2/FLU/RSV assay is intended as an aid in  the diagnosis of influenza from Nasopharyngeal swab specimens and  should not be used as a sole basis for treatment. Nasal washings and  aspirates are unacceptable for Xpert Xpress SARS-CoV-2/FLU/RSV  testing. Fact Sheet for Patients: PinkCheek.be Fact Sheet for Healthcare Providers: GravelBags.it This test is not yet approved or cleared by the [place] FDA and  has been authorized for detection and/or diagnosis of SARS-CoV-2 by  FDA under an Emergency Use Authorization (EUA). This EUA will remain  in effect (meaning this test can be used) for the duration of the  Covid-19 declaration under Section 564(b)(1) of the Act, 21  U.S.C. section 360bbb-3(b)(1), unless the authorization is  terminated or revoked. Performed at [facility], [place] [address] [place], [place] [address]      Radiology Studies: No results found.  Scheduled Meds: . atorvastatin  40 mg Oral q1800  . docusate sodium  100 mg Oral Daily  . enoxaparin (LOVENOX) injection  40 mg Subcutaneous Q24H   . gabapentin  100 mg Oral BID  . insulin aspart  0-15 Units Subcutaneous TID AC & HS  . Living Better with Heart Failure Book   Does not apply Once  . polyethylene glycol  17 g Oral Daily  . potassium chloride  20 mEq Oral BID  . sodium chloride flush  3 mL Intravenous Q12H  . spironolactone  25 mg Oral Daily  . umeclidinium-vilanterol  1 puff Inhalation Daily   Continuous Infusions: . sodium chloride    . furosemide 120 mg (01/01/20 0845)     Time spent: 28 minutes with over 50% of the time coordinating the patient's care    Jai-Gurmukh Nesiah Jump  Call night coverage person covering after 7pm

## 2020-01-02 DIAGNOSIS — I5033 Acute on chronic diastolic (congestive) heart failure: Secondary | ICD-10-CM | POA: Diagnosis not present

## 2020-01-02 DIAGNOSIS — N1831 Chronic kidney disease, stage 3a: Secondary | ICD-10-CM | POA: Diagnosis not present

## 2020-01-02 DIAGNOSIS — I2510 Atherosclerotic heart disease of native coronary artery without angina pectoris: Secondary | ICD-10-CM | POA: Diagnosis not present

## 2020-01-02 LAB — CBC WITH DIFFERENTIAL/PLATELET
Abs Immature Granulocytes: 0.02 10*3/uL (ref 0.00–0.07)
Basophils Absolute: 0 10*3/uL (ref 0.0–0.1)
Basophils Relative: 1 %
Eosinophils Absolute: 0.1 10*3/uL (ref 0.0–0.5)
Eosinophils Relative: 4 %
HCT: 26.6 % — ABNORMAL LOW (ref 39.0–52.0)
Hemoglobin: 7.2 g/dL — ABNORMAL LOW (ref 13.0–17.0)
Immature Granulocytes: 1 %
Lymphocytes Relative: 22 %
Lymphs Abs: 0.7 10*3/uL (ref 0.7–4.0)
MCH: 22.3 pg — ABNORMAL LOW (ref 26.0–34.0)
MCHC: 27.1 g/dL — ABNORMAL LOW (ref 30.0–36.0)
MCV: 82.4 fL (ref 80.0–100.0)
Monocytes Absolute: 0.3 10*3/uL (ref 0.1–1.0)
Monocytes Relative: 10 %
Neutro Abs: 1.9 10*3/uL (ref 1.7–7.7)
Neutrophils Relative %: 62 %
Platelets: 115 10*3/uL — ABNORMAL LOW (ref 150–400)
RBC: 3.23 MIL/uL — ABNORMAL LOW (ref 4.22–5.81)
RDW: 16.5 % — ABNORMAL HIGH (ref 11.5–15.5)
WBC: 3 10*3/uL — ABNORMAL LOW (ref 4.0–10.5)
nRBC: 0 % (ref 0.0–0.2)

## 2020-01-02 LAB — COMPREHENSIVE METABOLIC PANEL
ALT: 17 U/L (ref 0–44)
AST: 25 U/L (ref 15–41)
Albumin: 3.9 g/dL (ref 3.5–5.0)
Alkaline Phosphatase: 112 U/L (ref 38–126)
Anion gap: 9 (ref 5–15)
BUN: 30 mg/dL — ABNORMAL HIGH (ref 8–23)
CO2: 30 mmol/L (ref 22–32)
Calcium: 8.7 mg/dL — ABNORMAL LOW (ref 8.9–10.3)
Chloride: 99 mmol/L (ref 98–111)
Creatinine, Ser: 1.3 mg/dL — ABNORMAL HIGH (ref 0.61–1.24)
GFR calc Af Amer: >60 mL/min (ref >60)
GFR calc non Af Amer: 54 mL/min — ABNORMAL LOW (ref >60)
Glucose, Bld: 103 mg/dL — ABNORMAL HIGH (ref 70–99)
Potassium: 4.2 mmol/L (ref 3.5–5.1)
Sodium: 138 mmol/L (ref 135–145)
Total Bilirubin: 0.7 mg/dL (ref 0.3–1.2)
Total Protein: 7.3 g/dL (ref 6.5–8.1)

## 2020-01-02 LAB — GLUCOSE, CAPILLARY
Glucose-Capillary: 93 mg/dL (ref 70–99)
Glucose-Capillary: 190 mg/dL — ABNORMAL HIGH (ref 70–99)
Glucose-Capillary: 112 mg/dL — ABNORMAL HIGH (ref 70–99)
Glucose-Capillary: 171 mg/dL — ABNORMAL HIGH (ref 70–99)

## 2020-01-02 LAB — RETICULOCYTES
Immature Retic Fract: 17.2 % — ABNORMAL HIGH (ref 2.3–15.9)
RBC.: 3.18 MIL/uL — ABNORMAL LOW (ref 4.22–5.81)
Retic Count, Absolute: 62.6 10*3/uL (ref 19.0–186.0)
Retic Ct Pct: 2 % (ref 0.4–3.1)

## 2020-01-02 LAB — FERRITIN: Ferritin: 19 ng/mL — ABNORMAL LOW (ref 24–336)

## 2020-01-02 LAB — IRON AND TIBC
Iron: 17 ug/dL — ABNORMAL LOW (ref 45–182)
Saturation Ratios: 3 % — ABNORMAL LOW (ref 17.9–39.5)
TIBC: 559 ug/dL — ABNORMAL HIGH (ref 250–450)
UIBC: 542 ug/dL

## 2020-01-02 LAB — FOLATE: Folate: 12.3 ng/mL (ref >5.9)

## 2020-01-02 LAB — VITAMIN B12: Vitamin B-12: 244 pg/mL (ref 180–914)

## 2020-01-02 MED ORDER — SODIUM CHLORIDE 0.9 % IV SOLN
510.0000 mg | INTRAVENOUS | Status: DC
  Administered 2020-01-02: 510 mg via INTRAVENOUS
  Filled 2020-01-02: qty 510

## 2020-01-02 NOTE — Progress Notes (Addendum)
PROGRESS NOTE    [name]    Code Status: Full Code  QPY:[id number] DOB: October 12, 1945 DOA: 12/26/2019 LOS: 6 days  PCP: [name], MD CC:  Chief Complaint  Patient presents with  . Shortness of Breath       Hospital Summary   [name] is a 74 y.o. male with a history of HFpEF, CKD 3, type 2 diabetes, cirrhosis, COVID-19 infection diagnosed 10/31/2019, CAD status post DES x2 and CABG, diabetic peripheral neuropathy, iron deficiency anemia, asthma who was admitted for increasing shortness of breath and bilateral lower extremity edema x2 weeks and admitted for HFpEF exacerbation.  Overnight 5/1, patient had increased wheeze and difficulty breathing which improved with neb treatment.   5/2: Volume overload increasing despite increased lasix. Echo ordered, Cardiology consulted.   A & P   Active Problems:   GERD without esophagitis   OSA (obstructive sleep apnea)   Coronary artery disease involving native coronary artery of native heart without angina pectoris   CKD (chronic kidney disease), stage III   Acute on chronic diastolic CHF (congestive heart failure) (HCC)   Type 2 diabetes mellitus with hyperglycemia, without long-term current use of insulin (HCC)   Asthma, mild intermittent   Acute on chronic diastolic (congestive) heart failure (HCC)   Alcoholic cirrhosis of liver with ascites (HCC)   Acute exacerbation of CHF (congestive heart failure) ([place])    1. Acute on chronic HFpEF exacerbation  a. Echo 07/2019: EF 55-60% with moderately dilated left atria and mod mitral calcification b. Weight trending down, 126 kg to 122 kg, output -12.8 L since admit c. + Pitting edema of abdomen, + LE edema but improving d. ON lasix 120 mg IV BID, Aldactone 25 daily e. Low sodium diet, fluid restriction f. Cardiology on board and seems to think might be stable for discharge as early as tomorrow  2. Alcoholic Cirrhosis, not in acute decompensation a. On spironolactone as  outpatient which was held on admission due to AKI b. Spironolactone restarted by cardiology 12.5  3. AKI Likely cardio- renal which is improving with diuresis a. Monitor creatinine trends--seems to have steadily increased over the past several days and now is 30/1.3  4. COVID-19 positive a. Off precautions as patient was diagnosed on 10/31/19 and still within 62 'positive test' window b. Discussed with infection prevention and ok to be off precautions  5. Wheeze Likely cardiac-wheeze from heart failure a. Improving but still wheezy b. Continue diuresis and continue PRN nebs and anoro ellipta  6. Acute on chronic anemia with history of PUD, chronic blood loss anemia and gastric angioectasias a. EGD 12/20/17 with 5 nonbleeding anbioectasias of the stomach treated with argon plasma coagulation and nonbleeding duodenal diverticula with Dr. [facility] GI) b. Patient was scheduled for a colonoscopy following his 2019 EGD but this was cancelled and was lost to follow up c. Hb has been stable between 7.0 and 7.7 not requiring transfusion--continue to hold off on PRBC transfusion at 2/2 massive volume overload and is asymptomatic from an anemia standpoint and Hb >7 d. If Hb <7.0 then would transfuse with lasix and consider GI consult  e. I have notified Dr. Therisa Doyne of this and she is going to have her office call the patient at discharge for follow up f. FOBT ordered but not done g. Iron studies consistent with severe iron deficiency will give Feraheme and monitor result of the same with reticulocyte count as an outpatient  7. Pancytopenia, probably from  cirrhosis a. Peripheral smear with pancytopenia b. TSH unremarkable, HIV negative c. HCV screen negative in 2017  8. CAD with history of DES and CABG 1. Was taken off aspirin due to GI bleeding in the past and apparently taken off statin in the past by GI as well, but not sure why since his LFTs have been normal for years 2. Discussed with Dr.  Therisa Doyne, [facility], who was unable to find a specific reason through GI notes as to why his statin was stopped and did not see a contraindication for stopping it.  3. Will restart Lipitor 40 mg   9. Type 2 diabetes currently with sliding scale  10. Controlled mild admission asthma  11. GERD  12. Constipation 1. miralax and colace  13. OSA refusing CPAP as he states that he cannot use machine at home as it knocks out his power and his wife uses  DVT prophylaxis: lovenox Family Communication: patient updated at bedside  Disposition Plan:  Status is: Inpatient  Remains inpatient appropriate because:IV treatments appropriate due to intensity of illness or inability to take PO and Inpatient level of care appropriate due to severity of illness Still needs IV lasix and volume management   Dispo: The patient is from: Home              Anticipated d/c is to: Home              Anticipated d/c date is: > 3 days              Patient currently is not medically stable to d/c.   Pressure injury documentation    None  Consultants  Cardiology  Procedures  None  Antibiotics   Anti-infectives (From admission, onward)   None     Subjective  feels better walked around the unit and has been doing well overall No bleeding no nausea no vomiting Still feels overall swollen though  Objective   Vitals:   01/02/20 0511 01/02/20 0752 01/02/20 0754 01/02/20 0945  BP: (!) 119/51     Pulse: 64     Resp: 18     Temp: 98 F (36.7 C)     TempSrc: Oral     SpO2: 97% 95% 95% 100%  Weight: 115.4 kg     Height:        Intake/Output Summary (Last 24 hours) at 01/02/2020 1112 Last data filed at 01/02/2020 6811 Gross per 24 hour  Intake 1077 ml  Output 2675 ml  Net -1598 ml   Filed Weights   01/01/20 0500 01/01/20 1220 01/02/20 0511  Weight: 122.2 kg 117.2 kg 115.4 kg    Examination:  Awake coherent no distress EOMI NCAT no focal deficit No pallor no icterus S1-S2 no murmur rub or  gallop Chest clear no added sound Abdomen is distended swollen he has what looks like an umbilical hernia He has lower extremity edema and has stockings on Neurologically intact moving all 4 limbs equally  Data Reviewed: I have personally reviewed following labs and imaging studies  CBC: Recent Labs  Lab 12/28/19 0611 12/28/19 1327 12/29/19 0506 12/29/19 0506 12/29/19 1046 12/30/19 0556 12/31/19 0550 01/01/20 0539 01/02/20 0538  WBC 3.4*  --  3.1*  --   --  3.3* 3.7* 3.7* 3.0*  NEUTROABS 2.4  --   --   --   --   --   --   --  1.9  HGB 7.2*   < > 7.0*   < >  7.7* 7.3* 7.5* 7.2* 7.2*  HCT 27.1*   < > 25.5*   < > 27.7* 26.6* 27.1* 26.4* 26.6*  MCV 83.9  --  83.3  --   --  83.1 83.1 82.8 82.4  PLT 122*  --  114*  --   --  113* 129* 117* 115*   < > = values in this interval not displayed.   Basic Metabolic Panel: Recent Labs  Lab 12/27/19 1652 12/28/19 0611 12/29/19 0506 12/30/19 0556 12/31/19 0550 01/01/20 0539 01/02/20 0538  NA  --    < > 135 137 138 137 138  K  --    < > 3.8 3.8 4.0 4.3 4.2  CL  --    < > 102 103 101 100 99  CO2  --    < > 26 27 28 28 30   GLUCOSE  --    < > 137* 112* 103* 106* 103*  BUN  --    < > 26* 26* 25* 29* 30*  CREATININE  --    < > 1.22 1.23 1.02 1.37* 1.30*  CALCIUM  --    < > 8.3* 8.4* 8.5* 8.4* 8.7*  MG 2.3  --  2.2 2.3  --   --   --    < > = values in this interval not displayed.   GFR: Estimated Creatinine Clearance: 62.4 mL/min (A) (by C-G formula based on SCr of 1.3 mg/dL (H)). Liver Function Tests: Recent Labs  Lab 12/31/19 0550 01/02/20 0538  AST 25 25  ALT 18 17  ALKPHOS 93 112  BILITOT 0.8 0.7  PROT 7.1 7.3  ALBUMIN 3.8 3.9   No results for input(s): LIPASE, AMYLASE in the last 168 hours. No results for input(s): AMMONIA in the last 168 hours. Coagulation Profile: No results for input(s): INR, PROTIME in the last 168 hours. Cardiac Enzymes: No results for input(s): CKTOTAL, CKMB, CKMBINDEX, TROPONINI in the last 168  hours. BNP (last 3 results) No results for input(s): PROBNP in the last 8760 hours. HbA1C: No results for input(s): HGBA1C in the last 72 hours. CBG: Recent Labs  Lab 01/01/20 0726 01/01/20 1123 01/01/20 1613 01/01/20 2134 01/02/20 0752  GLUCAP 90 157* 101* 185* 93   Lipid Profile: No results for input(s): CHOL, HDL, LDLCALC, TRIG, CHOLHDL, LDLDIRECT in the last 72 hours. Thyroid Function Tests: No results for input(s): TSH, T4TOTAL, FREET4, T3FREE, THYROIDAB in the last 72 hours. Anemia Panel: Recent Labs    01/02/20 0538  VITAMINB12 244  FOLATE 12.3  FERRITIN 19*  TIBC 559*  IRON 17*  RETICCTPCT 2.0   Sepsis Labs: No results for input(s): PROCALCITON, LATICACIDVEN in the last 168 hours.  Recent Results (from the past 240 hour(s))  Respiratory Panel by RT PCR (Flu A&B, Covid) - Nasopharyngeal Swab     Status: Abnormal   Collection Time: 12/27/19  4:22 AM   Specimen: Nasopharyngeal Swab  Result Value Ref Range Status   SARS Coronavirus 2 by RT PCR POSITIVE (A) NEGATIVE Final    Comment: RESULT CALLED TO, READ BACK BY AND VERIFIED WITH: FREAKEY,J. RN AT 6568 12/27/19 MULLINS,T (NOTE) SARS-CoV-2 target nucleic acids are DETECTED. SARS-CoV-2 RNA is generally detectable in upper respiratory specimens  during the acute phase of infection. Positive results are indicative of the presence of the identified virus, but do not rule out bacterial infection or co-infection with other pathogens not detected by the test. Clinical correlation with patient history and other diagnostic information is necessary to  determine patient infection status. The expected result is Negative. Fact Sheet for Patients:  PinkCheek.be Fact Sheet for Healthcare Providers: GravelBags.it This test is not yet approved or cleared by the [place] FDA and  has been authorized for detection and/or diagnosis of SARS-CoV-2 by FDA under an  Emergency Use Authorization (EUA).  This EUA will remain in effect (meaning this test can be [place] ed) for the duration of  the COVID-19 declaration under Section 564(b)(1) of the Act, 21 U.S.C. section 360bbb-3(b)(1), unless the authorization is terminated or revoked sooner.    Influenza A by PCR NEGATIVE NEGATIVE Final   Influenza B by PCR NEGATIVE NEGATIVE Final    Comment: (NOTE) The Xpert Xpress SARS-CoV-2/FLU/RSV assay is intended as an aid in  the diagnosis of influenza from Nasopharyngeal swab specimens and  should not be used as a sole basis for treatment. Nasal washings and  aspirates are unacceptable for Xpert Xpress SARS-CoV-2/FLU/RSV  testing. Fact Sheet for Patients: PinkCheek.be Fact Sheet for Healthcare Providers: GravelBags.it This test is not yet approved or cleared by the [place] FDA and  has been authorized for detection and/or diagnosis of SARS-CoV-2 by  FDA under an Emergency Use Authorization (EUA). This EUA will remain  in effect (meaning this test can be used) for the duration of the  Covid-19 declaration under Section 564(b)(1) of the Act, 21  U.S.C. section 360bbb-3(b)(1), unless the authorization is  terminated or revoked. Performed at [facility], [place] [address] [place], [place] [address]      Radiology Studies: No results found.  Scheduled Meds: . atorvastatin  40 mg Oral q1800  . docusate sodium  100 mg Oral Daily  . enoxaparin (LOVENOX) injection  40 mg Subcutaneous Q24H  . gabapentin  100 mg Oral BID  . insulin aspart  0-15 Units Subcutaneous TID AC & HS  . Living Better with Heart Failure Book   Does not apply Once  . polyethylene glycol  17 g Oral Daily  . potassium chloride  20 mEq Oral BID  . sodium chloride flush  3 mL Intravenous Q12H  . spironolactone  25 mg Oral Daily  . umeclidinium-vilanterol  1 puff Inhalation Daily   Continuous Infusions: .  sodium chloride    . ferumoxytol    . furosemide 120 mg (01/02/20 0854)     Time spent: 18 minutes with over 50% of the time coordinating the patient's care    Jai-Gurmukh Gerren Hoffmeier  Call night coverage person covering after 7pm

## 2020-01-02 NOTE — Progress Notes (Signed)
Pt continues to decline cpap.  Pt encouraged to call should he change his mind.

## 2020-01-02 NOTE — Progress Notes (Signed)
Physical Therapy Treatment Patient Details Name: [name] MRN: [id number] DOB: [birth year] Today's Date: 01/02/2020    History of Present Illness 74 y.o. male with a history of HFpEF, CKD 3, type 2 diabetes, cirrhosis, COVID-19 infection diagnosed 10/31/2019, CAD status post DES x2 and CABG, diabetic peripheral neuropathy, iron deficiency anemia, asthma who was admitted for increasing shortness of breath and bilateral lower extremity edema x2 weeks and admitted for HFpEF exacerbation    PT Comments    Pt has met PT goals, he ambulated 220' without an assistive device, no loss of balance, SaO2 100% on room air while walking, HR 80s, no dyspnea. He is ready to DC home from PT standpoint. Will sign off.   Follow Up Recommendations  No PT follow up     Equipment Recommendations  3in1 (PT)    Recommendations for Other Services       Precautions / Restrictions Precautions Precautions: Fall;Other (comment) Precaution Comments: 2 falls in past 1 year 2* "tripping over things", monitor O2 Restrictions Weight Bearing Restrictions: No    Mobility  Bed Mobility Overal bed mobility: Independent                Transfers Overall transfer level: Independent                  Ambulation/Gait Ambulation/Gait assistance: Independent Gait Distance (Feet): 220 Feet Assistive device: None Gait Pattern/deviations: WFL(Within Functional Limits) Gait velocity: WNL   General Gait Details: steady, no dyspnea, SaO2 100% on room air, HR 80s   Stairs             Wheelchair Mobility    Modified Rankin (Stroke Patients Only)       Balance Overall balance assessment: No apparent balance deficits (not formally assessed)                                          Cognition Arousal/Alertness: Awake/alert Behavior During Therapy: WFL for tasks assessed/performed Overall Cognitive Status: Within Functional Limits for tasks assessed                                         Exercises      General Comments        Pertinent Vitals/Pain Pain Assessment: No/denies pain    Home Living                      Prior Function            PT Goals (current goals can now be found in the care plan section) Acute Rehab PT Goals Patient Stated Goal: "get rid of this fluid" PT Goal Formulation: With patient Time For Goal Achievement: 01/13/20 Potential to Achieve Goals: Good Progress towards PT goals: Goals met/education completed, patient discharged from PT    Frequency    Min 3X/week      PT Plan Current plan remains appropriate    Co-evaluation              AM-PAC PT "6 Clicks" Mobility   Outcome Measure  Help needed turning from your back to your side while in a flat bed without using bedrails?: None Help needed moving from lying on your back to sitting on the side of a flat bed without using  bedrails?: None Help needed moving to and from a bed to a chair (including a wheelchair)?: None Help needed standing up from a chair using your arms (e.g., wheelchair or bedside chair)?: None Help needed to walk in hospital room?: None Help needed climbing 3-5 steps with a railing? : None 6 Click Score: 24    End of Session Equipment Utilized During Treatment: Gait belt Activity Tolerance: Patient tolerated treatment well Patient left: with call bell/phone within reach;in bed;with nursing/sitter in room Nurse Communication: Mobility status PT Visit Diagnosis: Difficulty in walking, not elsewhere classified (R26.2)     Time: 5826-0888 PT Time Calculation (min) (ACUTE ONLY): 12 min  Charges:  $Gait Training: 8-22 mins                     Blondell Reveal Kistler PT 01/02/2020  Acute Rehabilitation Services Pager [phone] Office [phone]

## 2020-01-02 NOTE — Progress Notes (Signed)
Progress Note  Patient Name: [name] Date of Encounter: 01/02/2020  Primary Cardiologist: Mertie Moores, MD  Subjective   Feeling good today.  Denies any chest pain or SOB.   Inpatient Medications    Scheduled Meds: . atorvastatin  40 mg Oral q1800  . docusate sodium  100 mg Oral Daily  . enoxaparin (LOVENOX) injection  40 mg Subcutaneous Q24H  . gabapentin  100 mg Oral BID  . insulin aspart  0-15 Units Subcutaneous TID AC & HS  . Living Better with Heart Failure Book   Does not apply Once  . polyethylene glycol  17 g Oral Daily  . potassium chloride  20 mEq Oral BID  . sodium chloride flush  3 mL Intravenous Q12H  . spironolactone  25 mg Oral Daily  . umeclidinium-vilanterol  1 puff Inhalation Daily   Continuous Infusions: . sodium chloride    . ferumoxytol    . furosemide 120 mg (01/02/20 0854)   PRN Meds: sodium chloride, acetaminophen, albuterol, albuterol, nitroGLYCERIN, ondansetron (ZOFRAN) IV, sodium chloride flush   Vital Signs    Vitals:   01/02/20 0511 01/02/20 0752 01/02/20 0754 01/02/20 0945  BP: (!) 119/51     Pulse: 64     Resp: 18     Temp: 98 F (36.7 C)     TempSrc: Oral     SpO2: 97% 95% 95% 100%  Weight: 115.4 kg     Height:        Intake/Output Summary (Last 24 hours) at 01/02/2020 0951 Last data filed at 01/02/2020 0837 Gross per 24 hour  Intake 1078 ml  Output 3175 ml  Net -2097 ml   Last 3 Weights 01/02/2020 01/01/2020 01/01/2020  Weight (lbs) 254 lb 6.4 oz 258 lb 6.1 oz 269 lb 6.4 oz  Weight (kg) 115.395 kg 117.2 kg 122.2 kg     Telemetry    NSR - Personally Reviewed   Physical Exam   GEN: Well nourished, well developed in no acute distress HEENT: Normal NECK: No JVD; No carotid bruits LYMPHATICS: No lymphadenopathy CARDIAC:RRR, no murmurs, rubs, gallops RESPIRATORY:  Clear to auscultation without rales, wheezing or rhonchi  ABDOMEN: Soft, non-tender, non-distended MUSCULOSKELETAL:  2+ LE edema; No deformity  SKIN: Warm  and dry NEUROLOGIC:  Alert and oriented x 3 PSYCHIATRIC:  Normal affect    Labs    High Sensitivity Troponin:   Recent Labs  Lab 12/26/19 2004  TROPONINIHS 9      Cardiac EnzymesNo results for input(s): TROPONINI in the last 168 hours. No results for input(s): TROPIPOC in the last 168 hours.   Chemistry Recent Labs  Lab 12/31/19 0550 01/01/20 0539 01/02/20 0538  NA 138 137 138  K 4.0 4.3 4.2  CL 101 100 99  CO2 28 28 30   GLUCOSE 103* 106* 103*  BUN 25* 29* 30*  CREATININE 1.02 1.37* 1.30*  CALCIUM 8.5* 8.4* 8.7*  PROT 7.1  --  7.3  ALBUMIN 3.8  --  3.9  AST 25  --  25  ALT 18  --  17  ALKPHOS 93  --  112  BILITOT 0.8  --  0.7  GFRNONAA >60 51* 54*  GFRAA >60 59* >60  ANIONGAP 9 9 9      Hematology Recent Labs  Lab 12/31/19 0550 12/31/19 0550 01/01/20 0539 01/02/20 0538  WBC 3.7*  --  3.7* 3.0*  RBC 3.26*   < > 3.19* 3.23*  3.18*  HGB 7.5*  --  7.2* 7.2*  HCT 27.1*  --  26.4* 26.6*  MCV 83.1  --  82.8 82.4  MCH 23.0*  --  22.6* 22.3*  MCHC 27.7*  --  27.3* 27.1*  RDW 16.9*  --  16.5* 16.5*  PLT 129*  --  117* 115*   < > = values in this interval not displayed.    BNP Recent Labs  Lab 12/26/19 2012  BNP 266.2*     DDimer No results for input(s): DDIMER in the last 168 hours.   Radiology    No results found.  Cardiac Studies   2D Echo 07/2019  1. Left ventricular ejection fraction, by visual estimation, is 55 to  60%. The left ventricle has normal function. There is no left ventricular  hypertrophy.  2. Abnormal septal motion.  3. Global right ventricle has normal systolic function.The right  ventricular size is normal. No increase in right ventricular wall  thickness.  4. Left atrial size was moderately dilated.  5. Right atrial size was normal.  6. Moderate calcification of the mitral valve leaflet(s).  7. Moderate mitral annular calcification.  8. Moderate thickening of the mitral valve leaflet(s).  9. The mitral valve  is normal in structure. Trace mitral valve  regurgitation. No evidence of mitral stenosis.  10. The tricuspid valve is normal in structure. Tricuspid valve  regurgitation is trivial.  11. The aortic valve is tricuspid. Aortic valve regurgitation is not  visualized. sclerosis without stenosis.  12. There is Moderate thickening of the aortic valve.  13. There is Severe calcifcation of the aortic valve.  14. The pulmonic valve was normal in structure. Pulmonic valve  regurgitation is trivial.  15. Moderately elevated pulmonary artery systolic pressure.  16. The inferior vena cava is normal in size with greater than 50%   Patient Profile     74 y.o.malewith CAD s/p prior PCI 2013 and CABG 3419, chronic diastolic CHF, QQIWL-79 infection 10/2019, chronic back pain, asthma, arthritis, hepatic cirrhosis, prior GIB, gout, HLD, GERD, DM, HTN, chronic anemia who presented to [facility] with increased LEE. Also reported some SOB although not primary complaint. Unclear dry weight but pt appeared at least 30-40lb up from prior values.  Assessment & Plan    1. Acute on chronic diastolic CHF  - continues to diurese on high dose Lasix 120mg  IV BID.  -Weights have been very confusing and inconsistent this admission  -he put out 3.2L yesterday and is net neg 13.8L since admit -creatinine stable at 1.3 (1.37 yesterday) and K+ 4.2 -Spironolactone resumed at 25mg  daily.  -continue 2g sodium restriction, 2L fluid restriction  -he remains volume overloaded but lungs are clear and not SOB.  Suspect some of his LE edema related to obesity and chronic venous insuff -continue IV Lasix another day and then consider switch to PO  2. CAD  -no chest pain complaints.  -hsTroponin is negative. -No further workup planned at present time.  -Aspirin previously stopped in 11/2018 due to GI bleeding requiring transfusion.  -continue statin  3. CKD stage III  - per further review, his most recent creatinines have  fluctuated from 1.1-1.4.  -remains stable with diuresis at 1.3.  4. Anemia with pancytopenia  -slight worsening of Hgb this admission, also with decreased WBC/platelets.  -Per primary team.  5. OSA  - patient unfortunately refuses CPAP.  6. Asthma  - Intermittent wheezing noted, likely multifactorial. This has improved.  On 12/31/19, patient stated he cannot read, so need to keep in mind  for discharge education.  For questions or updates, please contact [place] Please consult [email or web address] for contact info under Cardiology/STEMI.  Signed, Fransico Him, MD 01/02/2020, 9:51 AM

## 2020-01-03 LAB — COMPREHENSIVE METABOLIC PANEL
ALT: 19 U/L (ref 0–44)
AST: 24 U/L (ref 15–41)
Albumin: 3.9 g/dL (ref 3.5–5.0)
Alkaline Phosphatase: 115 U/L (ref 38–126)
Anion gap: 10 (ref 5–15)
BUN: 30 mg/dL — ABNORMAL HIGH (ref 8–23)
CO2: 28 mmol/L (ref 22–32)
Calcium: 8.8 mg/dL — ABNORMAL LOW (ref 8.9–10.3)
Chloride: 99 mmol/L (ref 98–111)
Creatinine, Ser: 1.22 mg/dL (ref 0.61–1.24)
GFR calc Af Amer: >60 mL/min (ref >60)
GFR calc non Af Amer: 58 mL/min — ABNORMAL LOW (ref >60)
Glucose, Bld: 100 mg/dL — ABNORMAL HIGH (ref 70–99)
Potassium: 4.2 mmol/L (ref 3.5–5.1)
Sodium: 137 mmol/L (ref 135–145)
Total Bilirubin: 0.8 mg/dL (ref 0.3–1.2)
Total Protein: 7 g/dL (ref 6.5–8.1)

## 2020-01-03 LAB — GLUCOSE, CAPILLARY
Glucose-Capillary: 93 mg/dL (ref 70–99)
Glucose-Capillary: 148 mg/dL — ABNORMAL HIGH (ref 70–99)
Glucose-Capillary: 102 mg/dL — ABNORMAL HIGH (ref 70–99)
Glucose-Capillary: 112 mg/dL — ABNORMAL HIGH (ref 70–99)

## 2020-01-03 LAB — CBC WITH DIFFERENTIAL/PLATELET
Abs Immature Granulocytes: 0.01 10*3/uL (ref 0.00–0.07)
Basophils Absolute: 0 10*3/uL (ref 0.0–0.1)
Basophils Relative: 1 %
Eosinophils Absolute: 0.1 10*3/uL (ref 0.0–0.5)
Eosinophils Relative: 5 %
HCT: 26.1 % — ABNORMAL LOW (ref 39.0–52.0)
Hemoglobin: 7.2 g/dL — ABNORMAL LOW (ref 13.0–17.0)
Immature Granulocytes: 0 %
Lymphocytes Relative: 21 %
Lymphs Abs: 0.6 10*3/uL — ABNORMAL LOW (ref 0.7–4.0)
MCH: 22.4 pg — ABNORMAL LOW (ref 26.0–34.0)
MCHC: 27.6 g/dL — ABNORMAL LOW (ref 30.0–36.0)
MCV: 81.1 fL (ref 80.0–100.0)
Monocytes Absolute: 0.3 10*3/uL (ref 0.1–1.0)
Monocytes Relative: 11 %
Neutro Abs: 1.9 10*3/uL (ref 1.7–7.7)
Neutrophils Relative %: 62 %
Platelets: 126 10*3/uL — ABNORMAL LOW (ref 150–400)
RBC: 3.22 MIL/uL — ABNORMAL LOW (ref 4.22–5.81)
RDW: 16.4 % — ABNORMAL HIGH (ref 11.5–15.5)
WBC: 3 10*3/uL — ABNORMAL LOW (ref 4.0–10.5)
nRBC: 0 % (ref 0.0–0.2)

## 2020-01-03 LAB — PROTIME-INR
INR: 1.2 (ref 0.8–1.2)
Prothrombin Time: 14.4 seconds (ref 11.4–15.2)

## 2020-01-03 MED ORDER — TORSEMIDE 20 MG PO TABS
40.0000 mg | ORAL_TABLET | Freq: Two times a day (BID) | ORAL | Status: DC
  Administered 2020-01-03 – 2020-01-04 (×2): 40 mg via ORAL
  Filled 2020-01-03 (×2): qty 2

## 2020-01-03 NOTE — Progress Notes (Signed)
PROGRESS NOTE    [name]    Code Status: Full Code  LZJ:[id number] DOB: 08-10-1946 DOA: 12/26/2019 LOS: 7 days  PCP: [name], MD CC:  Chief Complaint  Patient presents with  . Shortness of Breath       Hospital Summary   [name] is a 74 y.o. male with a history of HFpEF, CKD 3, type 2 diabetes, cirrhosis, COVID-19 infection diagnosed 10/31/2019, CAD status post DES x2 and CABG, diabetic peripheral neuropathy, iron deficiency anemia, asthma who was admitted for increasing shortness of breath and bilateral lower extremity edema x2 weeks and admitted for HFpEF exacerbation.  Overnight 5/1, patient had increased wheeze and difficulty breathing which improved with neb treatment.   Subjective  A & P   Active Problems:   GERD without esophagitis   OSA (obstructive sleep apnea)   Coronary artery disease involving native coronary artery of native heart without angina pectoris   CKD (chronic kidney disease), stage III   Acute on chronic diastolic CHF (congestive heart failure) (HCC)   Type 2 diabetes mellitus with hyperglycemia, without long-term current use of insulin (HCC)   Asthma, mild intermittent   Acute on chronic diastolic (congestive) heart failure (HCC)   Alcoholic cirrhosis of liver with ascites (HCC)   Acute exacerbation of CHF (congestive heart failure) ([place])    1. Acute on chronic HFpEF exacerbation  2. Habitus suspicious for OHSS a. Echo 07/2019: EF 55-60% with moderately dilated left atria and mod mitral calcification b. Weight trending down, 126 kg to 122 kg, output - 14.8 L since admit, weight 121-->113 today c. + Pitting edema of abdomen, + LE edema present still but improved-has been changed to torsemide 40 twice daily per cardiology and they think he may be able to discharge 5/8 d. Low sodium diet, fluid restriction e. He is willing to trial BiPAP at night I will ask for at bedtime auto titration device by RT  3. Alcoholic Cirrhosis, not  in acute decompensation a. Initially held 2/2 AKI but now restarted Spironolactone  at lower dose per cardiology 12.5  4. AKI Likely cardio- renal which is improving with diuresis a. Monitor creatinine trends--seems to have steadily increased over the past several days and now is 30/1.3 and stable at 30/1.2  5. COVID-19 positive a. Off precautions as patient was diagnosed on 10/31/19 and still within 25 'positive test' window b. Discussed with infection prevention and ok to be off precautions  6. Wheeze Likely cardiac-wheeze from heart failure a. Wheeze has resolved b. Continue diuresis and continue PRN nebs and anoro ellipta  7. Acute on chronic anemia with history of PUD, chronic blood loss anemia and gastric angioectasias a. EGD 12/20/17 with 5 nonbleeding anbioectasias of the stomach treated with argon plasma coagulation and nonbleeding duodenal diverticula with Dr. Watt Climes [facility]) b. Patient was scheduled for a colonoscopy following his 2019 EGD but this was cancelled and was lost to follow up c. Hb has been stable between 7.0 and 7.7 not requiring transfusion--continue to hold off on PRBC transfusion at 2/2 massive volume overload and is asymptomatic from an anemia standpoint and Hb >7 d. If Hb <7.0 then would transfuse with lasix and consider GI consult  e. Dr. Deliah Boston did notif-Dr. Therisa Doyne of this and she is going to have her office call the patient at discharge for follow up f. FOBT ordered but not done g. Iron studies consistent with severe iron deficiency will give Feraheme and monitor result of the  same with reticulocyte count as an outpatient  8. Pancytopenia, probably from cirrhosis a. Peripheral smear with pancytopenia b. TSH unremarkable, HIV negative c. HCV screen negative in 2017  9. CAD with history of DES and CABG 1. Was taken off aspirin due to GI bleeding in the past and apparently taken off statin in the past by GI as well, but not sure why since his LFTs have been  normal for years 2. Discussed with Dr. Therisa Doyne, [facility], who was unable to find a specific reason through GI notes as to why his statin was stopped and did not see a contraindication for stopping it.  3. Will restart Lipitor 40 mg   10. Type 2 diabetes currently with sliding scale 93--148  11. Controlled mild admission asthma  12. GERD  13. Constipation 1. miralax and colace  14. OSA refusing CPAP as he states that he cannot use machine at home as it knocks out his power and his wife uses  DVT prophylaxis: lovenox Family Communication: patient updated at bedside  Disposition Plan:  Status is: Inpatient  Remains inpatient appropriate because:Ongoing diagnostic testing needed not appropriate for outpatient work up and Inpatient level of care appropriate due to severity of illness Still needs IV lasix and volume management   Dispo: The patient is from: Home              Anticipated d/c is to: Home              Anticipated d/c date is: 1 day              Patient currently is not medically stable to d/c.   Pressure injury documentation    None  Consultants  Cardiology  Procedures  None  Antibiotics   Anti-infectives (From admission, onward)   None     Subjective  Overall feeling better no distress no shortness of breath no chest pain no fever no chills Eating drinking Tells me after I discussed with him he is willing to trial BiPAP at night  Objective   Vitals:   01/03/20 0500 01/03/20 0537 01/03/20 0804 01/03/20 0806  BP:  (!) 119/53    Pulse:  69    Resp:  16    Temp:  98.5 F (36.9 C)    TempSrc:  Oral    SpO2:  95% 97% 97%  Weight: 113.8 kg     Height:        Intake/Output Summary (Last 24 hours) at 01/03/2020 1533 Last data filed at 01/03/2020 0020 Gross per 24 hour  Intake 491 ml  Output 2200 ml  Net -1709 ml   Filed Weights   01/01/20 1220 01/02/20 0511 01/03/20 0500  Weight: 117.2 kg 115.4 kg 113.8 kg    Examination:  No acute change from  5/6  Awake coherent no distress EOMI NCAT no focal deficit No pallor no icterus S1-S2 no murmur rub or gallop Chest clear no added sound Abdomen is distended swollen he has what looks like an umbilical hernia He has lower extremity edema and has stockings on Neurologically intact moving all 4 limbs equally  Data Reviewed: I have personally reviewed following labs and imaging studies  CBC: Recent Labs  Lab 12/28/19 0611 12/28/19 1327 12/30/19 0556 12/31/19 0550 01/01/20 0539 01/02/20 0538 01/03/20 0518  WBC 3.4*   < > 3.3* 3.7* 3.7* 3.0* 3.0*  NEUTROABS 2.4  --   --   --   --  1.9 1.9  HGB 7.2*   < >  7.3* 7.5* 7.2* 7.2* 7.2*  HCT 27.1*   < > 26.6* 27.1* 26.4* 26.6* 26.1*  MCV 83.9   < > 83.1 83.1 82.8 82.4 81.1  PLT 122*   < > 113* 129* 117* 115* 126*   < > = values in this interval not displayed.   Basic Metabolic Panel: Recent Labs  Lab 12/27/19 1652 12/28/19 2725 12/29/19 0506 12/29/19 0506 12/30/19 0556 12/31/19 0550 01/01/20 0539 01/02/20 0538 01/03/20 0518  NA  --    < > 135   < > 137 138 137 138 137  K  --    < > 3.8   < > 3.8 4.0 4.3 4.2 4.2  CL  --    < > 102   < > 103 101 100 99 99  CO2  --    < > 26   < > 27 28 28 30 28   GLUCOSE  --    < > 137*   < > 112* 103* 106* 103* 100*  BUN  --    < > 26*   < > 26* 25* 29* 30* 30*  CREATININE  --    < > 1.22   < > 1.23 1.02 1.37* 1.30* 1.22  CALCIUM  --    < > 8.3*   < > 8.4* 8.5* 8.4* 8.7* 8.8*  MG 2.3  --  2.2  --  2.3  --   --   --   --    < > = values in this interval not displayed.   GFR: Estimated Creatinine Clearance: 66.1 mL/min (by C-G formula based on SCr of 1.22 mg/dL). Liver Function Tests: Recent Labs  Lab 12/31/19 0550 01/02/20 0538 01/03/20 0518  AST 25 25 24   ALT 18 17 19   ALKPHOS 93 112 115  BILITOT 0.8 0.7 0.8  PROT 7.1 7.3 7.0  ALBUMIN 3.8 3.9 3.9   No results for input(s): LIPASE, AMYLASE in the last 168 hours. No results for input(s): AMMONIA in the last 168 hours. Coagulation  Profile: Recent Labs  Lab 01/03/20 0518  INR 1.2   Cardiac Enzymes: No results for input(s): CKTOTAL, CKMB, CKMBINDEX, TROPONINI in the last 168 hours. BNP (last 3 results) No results for input(s): PROBNP in the last 8760 hours. HbA1C: No results for input(s): HGBA1C in the last 72 hours. CBG: Recent Labs  Lab 01/02/20 1121 01/02/20 1555 01/02/20 2148 01/03/20 0730 01/03/20 1137  GLUCAP 190* 112* 171* 93 148*   Lipid Profile: No results for input(s): CHOL, HDL, LDLCALC, TRIG, CHOLHDL, LDLDIRECT in the last 72 hours. Thyroid Function Tests: No results for input(s): TSH, T4TOTAL, FREET4, T3FREE, THYROIDAB in the last 72 hours. Anemia Panel: Recent Labs    01/02/20 0538  VITAMINB12 244  FOLATE 12.3  FERRITIN 19*  TIBC 559*  IRON 17*  RETICCTPCT 2.0   Sepsis Labs: No results for input(s): PROCALCITON, LATICACIDVEN in the last 168 hours.  Recent Results (from the past 240 hour(s))  Respiratory Panel by RT PCR (Flu A&B, Covid) - Nasopharyngeal Swab     Status: Abnormal   Collection Time: 12/27/19  4:22 AM   Specimen: Nasopharyngeal Swab  Result Value Ref Range Status   SARS Coronavirus 2 by RT PCR POSITIVE (A) NEGATIVE Final    Comment: RESULT CALLED TO, READ BACK BY AND VERIFIED WITH: FREAKEY,J. RN AT 3664 12/27/19 MULLINS,T (NOTE) SARS-CoV-2 target nucleic acids are DETECTED. SARS-CoV-2 RNA is generally detectable in upper respiratory specimens  during the acute phase of  infection. Positive results are indicative of the presence of the identified virus, but do not rule out bacterial infection or co-infection with other pathogens not detected by the test. Clinical correlation with patient history and other diagnostic information is necessary to determine patient infection status. The expected result is Negative. Fact Sheet for Patients:  PinkCheek.be Fact Sheet for Healthcare Providers: GravelBags.it This  test is not yet approved or cleared by the [place] FDA and  has been authorized for detection and/or diagnosis of SARS-CoV-2 by FDA under an Emergency Use Authorization (EUA).  This EUA will remain in effect (meaning this test can be [place] ed) for the duration of  the COVID-19 declaration under Section 564(b)(1) of the Act, 21 U.S.C. section 360bbb-3(b)(1), unless the authorization is terminated or revoked sooner.    Influenza A by PCR NEGATIVE NEGATIVE Final   Influenza B by PCR NEGATIVE NEGATIVE Final    Comment: (NOTE) The Xpert Xpress SARS-CoV-2/FLU/RSV assay is intended as an aid in  the diagnosis of influenza from Nasopharyngeal swab specimens and  should not be used as a sole basis for treatment. Nasal washings and  aspirates are unacceptable for Xpert Xpress SARS-CoV-2/FLU/RSV  testing. Fact Sheet for Patients: PinkCheek.be Fact Sheet for Healthcare Providers: GravelBags.it This test is not yet approved or cleared by the [place] FDA and  has been authorized for detection and/or diagnosis of SARS-CoV-2 by  FDA under an Emergency Use Authorization (EUA). This EUA will remain  in effect (meaning this test can be used) for the duration of the  Covid-19 declaration under Section 564(b)(1) of the Act, 21  U.S.C. section 360bbb-3(b)(1), unless the authorization is  terminated or revoked. Performed at [facility], [place] [address], [place], [place] [address]      Radiology Studies: No results found.  Scheduled Meds: . atorvastatin  40 mg Oral q1800  . docusate sodium  100 mg Oral Daily  . enoxaparin (LOVENOX) injection  40 mg Subcutaneous Q24H  . gabapentin  100 mg Oral BID  . insulin aspart  0-15 Units Subcutaneous TID AC & HS  . Living Better with Heart Failure Book   Does not apply Once  . polyethylene glycol  17 g Oral Daily  . potassium chloride  20 mEq Oral BID  . sodium chloride  flush  3 mL Intravenous Q12H  . spironolactone  25 mg Oral Daily  . torsemide  40 mg Oral BID  . umeclidinium-vilanterol  1 puff Inhalation Daily   Continuous Infusions: . sodium chloride    . ferumoxytol 510 mg (01/02/20 1118)     Time spent: 15 minutes with over 50% of the time coordinating the patient's care    Jai-Gurmukh Kemia Wendel  Call night coverage person covering after 7pm

## 2020-01-03 NOTE — Progress Notes (Signed)
Progress Note  Patient Name: [name] Date of Encounter: 01/03/2020  Primary Cardiologist: Mertie Moores, MD   Subjective   No acute overnight events. He is sitting up in bed anxious to go home. He has been ambulating in the hall with no SOB but still has significant LE edema.   Inpatient Medications    Scheduled Meds: . atorvastatin  40 mg Oral q1800  . docusate sodium  100 mg Oral Daily  . enoxaparin (LOVENOX) injection  40 mg Subcutaneous Q24H  . gabapentin  100 mg Oral BID  . insulin aspart  0-15 Units Subcutaneous TID AC & HS  . Living Better with Heart Failure Book   Does not apply Once  . polyethylene glycol  17 g Oral Daily  . potassium chloride  20 mEq Oral BID  . sodium chloride flush  3 mL Intravenous Q12H  . spironolactone  25 mg Oral Daily  . umeclidinium-vilanterol  1 puff Inhalation Daily   Continuous Infusions: . sodium chloride    . ferumoxytol 510 mg (01/02/20 1118)  . furosemide 120 mg (01/02/20 1803)   PRN Meds: sodium chloride, acetaminophen, albuterol, albuterol, nitroGLYCERIN, ondansetron (ZOFRAN) IV, sodium chloride flush   Vital Signs    Vitals:   01/03/20 0500 01/03/20 0537 01/03/20 0804 01/03/20 0806  BP:  (!) 119/53    Pulse:  69    Resp:  16    Temp:  98.5 F (36.9 C)    TempSrc:  Oral    SpO2:  95% 97% 97%  Weight: 113.8 kg     Height:        Intake/Output Summary (Last 24 hours) at 01/03/2020 0921 Last data filed at 01/03/2020 0020 Gross per 24 hour  Intake 966.25 ml  Output 3000 ml  Net -2033.75 ml   Last 3 Weights 01/03/2020 01/02/2020 01/01/2020  Weight (lbs) 250 lb 14.1 oz 254 lb 6.4 oz 258 lb 6.1 oz  Weight (kg) 113.8 kg 115.395 kg 117.2 kg      Telemetry    NSR - Personally Reviewed  ECG    No new EKG to review - Personally Reviewed  Physical Exam   GEN: No acute distress.   Neck: No JVD Cardiac: RRR, no murmurs, rubs, or gallops.  Respiratory: Clear to auscultation bilaterally. GI: Soft, nontender,  non-distended  MS: 2+ LE edema up to his knees but improving; No deformity. Neuro:  Nonfocal  Psych: Normal affect   Labs    High Sensitivity Troponin:   Recent Labs  Lab 12/26/19 2004  TROPONINIHS 9      Chemistry Recent Labs  Lab 12/31/19 0550 12/31/19 0550 01/01/20 0539 01/02/20 0538 01/03/20 0518  NA 138   < > 137 138 137  K 4.0   < > 4.3 4.2 4.2  CL 101   < > 100 99 99  CO2 28   < > 28 30 28   GLUCOSE 103*   < > 106* 103* 100*  BUN 25*   < > 29* 30* 30*  CREATININE 1.02   < > 1.37* 1.30* 1.22  CALCIUM 8.5*   < > 8.4* 8.7* 8.8*  PROT 7.1  --   --  7.3 7.0  ALBUMIN 3.8  --   --  3.9 3.9  AST 25  --   --  25 24  ALT 18  --   --  17 19  ALKPHOS 93  --   --  112 115  BILITOT 0.8  --   --  0.7 0.8  GFRNONAA >60   < > 51* 54* 58*  GFRAA >60   < > 59* >60 >60  ANIONGAP 9   < > 9 9 10    < > = values in this interval not displayed.     Hematology Recent Labs  Lab 01/01/20 0539 01/02/20 0538 01/03/20 0518  WBC 3.7* 3.0* 3.0*  RBC 3.19* 3.23*  3.18* 3.22*  HGB 7.2* 7.2* 7.2*  HCT 26.4* 26.6* 26.1*  MCV 82.8 82.4 81.1  MCH 22.6* 22.3* 22.4*  MCHC 27.3* 27.1* 27.6*  RDW 16.5* 16.5* 16.4*  PLT 117* 115* 126*    BNPNo results for input(s): BNP, PROBNP in the last 168 hours.   DDimer No results for input(s): DDIMER in the last 168 hours.   Radiology    No results found.  Cardiac Studies   Echocardiogram 07/30/2019: Impressions: 1. Left ventricular ejection fraction, by visual estimation, is 55 to  60%. The left ventricle has normal function. There is no left ventricular  hypertrophy.  2. Abnormal septal motion.  3. Global right ventricle has normal systolic function.The right  ventricular size is normal. No increase in right ventricular wall  thickness.  4. Left atrial size was moderately dilated.  5. Right atrial size was normal.  6. Moderate calcification of the mitral valve leaflet(s).  7. Moderate mitral annular calcification.  8.  Moderate thickening of the mitral valve leaflet(s).  9. The mitral valve is normal in structure. Trace mitral valve  regurgitation. No evidence of mitral stenosis.  10. The tricuspid valve is normal in structure. Tricuspid valve  regurgitation is trivial.  11. The aortic valve is tricuspid. Aortic valve regurgitation is not  visualized. sclerosis without stenosis.  12. There is Moderate thickening of the aortic valve.  13. There is Severe calcifcation of the aortic valve.  14. The pulmonic valve was normal in structure. Pulmonic valve  regurgitation is trivial.  15. Moderately elevated pulmonary artery systolic pressure.  16. The inferior vena cava is normal in size with greater than 50%  respiratory variability, suggesting right atrial pressure of 3 mmHg.  Patient Profile     74 y.o.malewith CAD s/p prior PCI 2013 and CABG 6384, chronic diastolic CHF, [id number] infection 10/2019, chronic back pain, asthma, arthritis, hepatic cirrhosis, prior GIB, gout, HLD, GERD, DM, HTN, chronic anemia who presented to [facility] with increased lower extremity edema. Also reported some SOB although not primary complaint. Unclear dry weight but pt appeared at least 30-40lb up from prior values.  Assessment & Plan    Acute on Chronic Diastolic CHF - Patient presented with worsening lower extremity and some shortness of breath.  - BNP elevated at 266.2. - Chest x-ray showed mild vascular congestion and developing infiltrate in right mid lung. - Chest CTA negative for PE but noted small right pleural effusion and mild pulmonary edema as well as hepatic cirrhosis and splenomegaly.  - Echo from 07/2019 showed LVEF of 55-60% with abnormal septal motion and moderately elevated PASP.  - Currently being diuresed with IV Lasix 120mg  twice daily.  -Documented urinary output of 3.3 L in the past 24 hours and net negative 15.8 L since admission.  -Weight 250 lbs today, down from 254 lbs yesterday. Unclear what initially  weight was. Renal function stable with creatinine 1.22 - He still has significant LE edema but I think it is mainly from chronic venous insuff and morbid obesity -Recommend that he be sent home with a Rx for compression hose that can  be fitted for him and wear daily - Continue Spironolactone 25mg  daily.  - change IV Lasix to Torsemide for better absorption>>will start with 40mg  BID since he required high doses of Lasix in hospital - Continue sodium/fluid restriction. - Continue monitoring daily weights, strict I/O's, and renal function.  CAD s/p CABG - High-sensitivity troponin negative.  - No complaints of chest pain.  - Aspirin previously stopped in 11/2018 due to GI bleed requiring transfusion. Continue statin.  Hypertension - BP controlled at 119/62mmHg - Continue Spironolactone 25mg  daily.  Hyperlipidemia - Continue Lipitor 40mg  daily.  CKD Stage III - Creatinine 1.22 today, down from 1.30 yesterday. Baseline around 1.1 to 1.4.  - BUN stable at 30 today. - Continue to monitor with diuresis.  Obstructive Sleep Apnea - Unfortunately, patient refuses CPAP.  Otherwise, per primary team.  - Alcoholic cirrhosis - Asthma with intermittent wheezing  - Acute on chronic anemia  - Pancytopenia - Type 2 diabetes mellitus   For questions or updates, please contact [place] Please consult [email or web address] for contact info under        Signed, Darreld Mclean, PA-C  01/03/2020, 9:21 AM

## 2020-01-03 NOTE — Care Management Important Message (Signed)
Important Message  Patient Details IM Letter given to Roque Lias SW Case Manager to present to the Patient Name: [name] MRN: [id number] Date of Birth: 1946/01/29   Medicare Important Message Given:  Yes     Kerin Salen 01/03/2020, 11:19 AM

## 2020-01-04 LAB — COMPREHENSIVE METABOLIC PANEL
ALT: 20 U/L (ref 0–44)
AST: 26 U/L (ref 15–41)
Albumin: 4 g/dL (ref 3.5–5.0)
Alkaline Phosphatase: 117 U/L (ref 38–126)
Anion gap: 10 (ref 5–15)
BUN: 28 mg/dL — ABNORMAL HIGH (ref 8–23)
CO2: 27 mmol/L (ref 22–32)
Calcium: 8.8 mg/dL — ABNORMAL LOW (ref 8.9–10.3)
Chloride: 99 mmol/L (ref 98–111)
Creatinine, Ser: 1.27 mg/dL — ABNORMAL HIGH (ref 0.61–1.24)
GFR calc Af Amer: >60 mL/min (ref >60)
GFR calc non Af Amer: 56 mL/min — ABNORMAL LOW (ref >60)
Glucose, Bld: 99 mg/dL (ref 70–99)
Potassium: 4.2 mmol/L (ref 3.5–5.1)
Sodium: 136 mmol/L (ref 135–145)
Total Bilirubin: 0.8 mg/dL (ref 0.3–1.2)
Total Protein: 7.1 g/dL (ref 6.5–8.1)

## 2020-01-04 LAB — GLUCOSE, CAPILLARY
Glucose-Capillary: 101 mg/dL — ABNORMAL HIGH (ref 70–99)
Glucose-Capillary: 174 mg/dL — ABNORMAL HIGH (ref 70–99)

## 2020-01-04 MED ORDER — SPIRONOLACTONE 25 MG PO TABS: 25.0000 mg | ORAL_TABLET | Freq: Every day | ORAL | 2 refills | Status: DC

## 2020-01-04 MED ORDER — ATORVASTATIN CALCIUM 40 MG PO TABS: 40.0000 mg | ORAL_TABLET | Freq: Every day | ORAL | 3 refills | Status: DC

## 2020-01-04 MED ORDER — TORSEMIDE 20 MG PO TABS: 40.0000 mg | ORAL_TABLET | Freq: Two times a day (BID) | ORAL | 0 refills | Status: DC

## 2020-01-04 NOTE — Progress Notes (Signed)
Pt leaving at 1230 this day with his son. Pt alert and oriented; without c/o. Discharge instructions given/explained with pt verbalizing understanding. Pt aware to followup. Writer informed pt's son of medication changes.

## 2020-01-04 NOTE — Progress Notes (Signed)
Progress Note  Patient Name: [name] Date of Encounter: 01/04/2020  Primary Cardiologist: Mertie Moores, MD   Subjective   74 year old gentleman admitted with acute on chronic diastolic congestive heart failure.  He also has hyperlipidemia, diabetes mellitus, alcoholic cirrhosis, coronary artery disease and chronic kidney disease.  He presented with profound leg edema and acute diastolic heart failure.  He is diuresed a total of 14.2 L so far during this hospitalization.  His renal function has remained stable.  He had COVID-19 in March, 2021.  He has been ambulating the halls.  He still has significant leg edema.  He admits to eating too much salt at home prior to his admission.  Inpatient Medications    Scheduled Meds: . atorvastatin  40 mg Oral q1800  . docusate sodium  100 mg Oral Daily  . enoxaparin (LOVENOX) injection  40 mg Subcutaneous Q24H  . gabapentin  100 mg Oral BID  . insulin aspart  0-15 Units Subcutaneous TID AC & HS  . Living Better with Heart Failure Book   Does not apply Once  . polyethylene glycol  17 g Oral Daily  . potassium chloride  20 mEq Oral BID  . sodium chloride flush  3 mL Intravenous Q12H  . spironolactone  25 mg Oral Daily  . torsemide  40 mg Oral BID  . umeclidinium-vilanterol  1 puff Inhalation Daily   Continuous Infusions: . sodium chloride    . ferumoxytol 510 mg (01/02/20 1118)   PRN Meds: sodium chloride, acetaminophen, albuterol, albuterol, nitroGLYCERIN, ondansetron (ZOFRAN) IV, sodium chloride flush   Vital Signs    Vitals:   01/03/20 1555 01/03/20 2022 01/04/20 0525 01/04/20 0623  BP: (!) 155/68 (!) 135/57 121/60   Pulse: 73 66 64   Resp: 18 18 18    Temp: 97.9 F (36.6 C) 97.6 F (36.4 C) 97.9 F (36.6 C)   TempSrc: Oral Oral Oral   SpO2: 96% 97% 99%   Weight:    111.8 kg  Height:        Intake/Output Summary (Last 24 hours) at 01/04/2020 0813 Last data filed at 01/04/2020 0500 Gross per 24 hour  Intake 240 ml   Output 1550 ml  Net -1310 ml   Last 3 Weights 01/04/2020 01/03/2020 01/02/2020  Weight (lbs) 246 lb 6.4 oz 250 lb 14.1 oz 254 lb 6.4 oz  Weight (kg) 111.766 kg 113.8 kg 115.395 kg      Telemetry    NSR  - Personally Reviewed  ECG     - Personally Reviewed  Physical Exam   GEN:  Middle-aged gentleman, moderately to moderately obese. Neck: No JVD Cardiac: RRR, no murmurs, rubs, or gallops.  Respiratory: Clear to auscultation bilaterally. GI: Soft, nontender, non-distended  MS:  2+ edema up into his thighs. Neuro:  Nonfocal  Psych: Normal affect   Labs    High Sensitivity Troponin:   Recent Labs  Lab 12/26/19 2004  TROPONINIHS 9      Chemistry Recent Labs  Lab 01/02/20 0538 01/03/20 0518 01/04/20 0655  NA 138 137 136  K 4.2 4.2 4.2  CL 99 99 99  CO2 30 28 27   GLUCOSE 103* 100* 99  BUN 30* 30* 28*  CREATININE 1.30* 1.22 1.27*  CALCIUM 8.7* 8.8* 8.8*  PROT 7.3 7.0 7.1  ALBUMIN 3.9 3.9 4.0  AST 25 24 26   ALT 17 19 20   ALKPHOS 112 115 117  BILITOT 0.7 0.8 0.8  GFRNONAA 54* 58* 56*  GFRAA >  60 >60 >60  ANIONGAP 9 10 10      Hematology Recent Labs  Lab 01/01/20 0539 01/02/20 0538 01/03/20 0518  WBC 3.7* 3.0* 3.0*  RBC 3.19* 3.23*  3.18* 3.22*  HGB 7.2* 7.2* 7.2*  HCT 26.4* 26.6* 26.1*  MCV 82.8 82.4 81.1  MCH 22.6* 22.3* 22.4*  MCHC 27.3* 27.1* 27.6*  RDW 16.5* 16.5* 16.4*  PLT 117* 115* 126*    BNPNo results for input(s): BNP, PROBNP in the last 168 hours.   DDimer No results for input(s): DDIMER in the last 168 hours.   Radiology    No results found.  Cardiac Studies     Patient Profile     74 y.o. male with a history of diastolic heart failure.  Was admitted with an acute on chronic exacerbation of this diastolic heart failure.  He admits to eating too much salt at home.  In addition, a lot of his symptoms worsened after his Covid infection.     Assessment & Plan    1.  Acute on chronic diastolic congestive heart failure:  He  appears to be stable for discharge.  We will send him home on torsemide instead of furosemide which may be better absorbed.  I recommended that he wear compression hose or use Ace bandages to help with his leg edema.  I have advised him to elevate his legs higher than his heart for an hour or so a day and also at night.   I strongly advised him to avoid salty.  He admits to eating lots of salty foods prior to his admission.  He is not had any episodes of angina.  Anticipate seeing him in the office in several weeks.  He will likely see my PA first and I will see him in several months.  2.  Coronary artery disease: He is not having any episodes of angina  3.  Diabetes mellitus: Further plans and management from his primary medical doctor  4.  Hyperlipidemia: I agree with restarting his atorvastatin.  I cannot find any reason that his atorvastatin was discontinued.  We will follow up in the office.  5.  Possible obstructive sleep apnea: He does not use a CPAP at home.  We will continue to strongly encourage the use of CPAP.  He may benefit from seeing Dr. Radford Pax for further advice on other possible masks since he does not like his current mask.     [facility] will sign off.   Medication Recommendations:   Other recommendations (labs, testing, etc):   Follow up as an outpatient:  With me or APP in several weeks with BMP   For questions or updates, please contact [place] Please consult [email or web address] for contact info under        Signed, Mertie Moores, MD  01/04/2020, 8:13 AM

## 2020-01-04 NOTE — Discharge Summary (Signed)
Physician Discharge Summary  MARCELLOUS SNARSKI RVI:[id number] DOB: April 15, 1946 DOA: 12/26/2019  PCP: [name], MD  Admit date: 12/26/2019 Discharge date: 01/04/2020  Time spent: 40 minutes  Recommendations for Outpatient Follow-up:  1. See medication changes Lasix Demadex, addition of atorvastatin in addition to other medication changes such as cutting back dose of Aldactone 2. Will need Chem-12 in about 1 week 3. Recommend strict follow-up in the outpatient setting with fluid restriction as well as salt restriction  Discharge Diagnoses:  Active Problems:   GERD without esophagitis   OSA (obstructive sleep apnea)   Coronary artery disease involving native coronary artery of native heart without angina pectoris   CKD (chronic kidney disease), stage III   Acute on chronic diastolic CHF (congestive heart failure) (HCC)   Type 2 diabetes mellitus with hyperglycemia, without long-term current use of insulin (HCC)   Asthma, mild intermittent   Acute on chronic diastolic (congestive) heart failure (HCC)   Alcoholic cirrhosis of liver with ascites (HCC)   Acute exacerbation of CHF (congestive heart failure) ([facility])   Discharge Condition: Improved  Diet recommendation: Heart healthy salt and fluid restricted diet 1200 cc less than 4 g a day  Filed Weights   01/02/20 0511 01/03/20 0500 01/04/20 6147  Weight: 115.4 kg 113.8 kg 111.8 kg    History of present illness:  74 y.o.malewith a history of HFpEF, CKD 3, type 2 diabetes, cirrhosis, COVID-19 infection diagnosed 10/31/2019, CAD status post DES x2 and CABG, diabetic peripheral neuropathy, iron deficiency anemia, asthmawho was admitted for increasing shortness of breath and bilateral lower extremity edema x2 weeks and admitted for HFpEF exacerbation.  Overnight 5/1, patient had increased wheeze and difficulty breathing which improved with neb treatment.   Hospital Course:  1. Acute on chronic HFpEF exacerbation  2. [place]  suspicious for OHSS a. Echo 07/2019: EF 55-60% with moderately dilated left atria and mod mitral calcification b. Weight trending down, 126 kg to 122 kg, output - 14 L since admit, weight 121-->113 today c. + Pitting edema of abdomen, + LE edema present still but improved-has been changed to torsemide 40 twice daily per cardiology on discharge d. Low sodium diet, fluid restriction e. He is unwilling to use BiPAP at home  1. Alcoholic Cirrhosis, not in acute decompensation a. Initially held 2/2 AKI but now restarted Spironolactone  at lower dose per cardiology 12.5  2. AKI Likely cardio- renal which is improving with diuresis a. Monitor creatinine trends--seems to have steadily increased over the past several days and now is 30/1.3 and stable at 30/1.2 on discharge it was 20/1.2  3. COVID-19 positive a. Off precautions as patient was diagnosed on 10/31/19 and still within 54 'positive test' window b. Discussed with infection prevention and ok to be off precautions  1. Wheeze Likely cardiac-wheeze from heart failure a. Wheeze has resolved b. Continue diuresis and continue PRN nebs and anoro ellipta  2. Acute on chronic anemia with history of PUD, chronic blood loss anemia and gastric angioectasias a. EGD 12/20/17 with 5 nonbleeding anbioectasias of the stomach treated with argon plasma coagulation and nonbleeding duodenal diverticula with Dr. Watt Climes [facility]) b. Patient was scheduled for a colonoscopy following his 2019 EGD but this was cancelled and was lost to follow up c. Hb has been stable between 7.0 and 7.7 not requiring transfusion--continue to hold off on PRBC transfusion at 2/2 massive volume overload and is asymptomatic from an anemia standpoint and Hb >7 d. If Hb <7.0 then would  transfuse with lasix and consider GI consult  e. Dr. Deliah Boston did notif-Dr. Therisa Doyne of this and she is going to have her office call the patient at discharge for follow up f. FOBT ordered but not  done g. Iron studies consistent with severe iron deficiency will give Feraheme and monitor result of the same with reticulocyte count as an outpatient  3. Pancytopenia, probably from cirrhosis a. Peripheral smear with pancytopenia b. TSH unremarkable, HIV negative c. HCV screen negative in 2017  4. CAD with history of DES and CABG 1. Was taken off aspirin due to GI bleeding in the past and apparently taken off statin in the past by GI as well, but not sure why since his LFTs have been normal for years 2. Discussed with Dr. Therisa Doyne, [facility], who was unable to find a specific reason through GI notes as to why his statin was stopped and did not see a contraindication for stopping it.  3. Will restart Lipitor 40 mg   5. Type 2 diabetescurrently with sliding scale 93--148  6. Controlled mild admission asthma  7. GERD  8. Constipation 1. miralax and colace  9. OSA refusing CPAP as he states that he cannot use machine at home as it knocks out his power and his wife uses  Procedures:  None   Consultations:  Cardiology  GI  Discharge Exam: Vitals:   01/04/20 0525 01/04/20 0834  BP: 121/60   Pulse: 64   Resp: 18   Temp: 97.9 F (36.6 C)   SpO2: 99% 96%    General: Awake alert coherent no distress Cardiovascular: S1-S2 no murmur rub or gallop Respiratory: Clinically clear no added sound no rales no rhonchi Lower extremity edema is present patient is not using stockings Abdomen is obese nontender Neurologically intact no focal deficits  Discharge Instructions   Discharge Instructions    Diet - low sodium heart healthy   Complete by: As directed    Discharge instructions   Complete by: As directed    Look at your meds carefully and notice changes to lasix going to demadex  Atorvastatin has been added   Increase activity slowly   Complete by: As directed      Allergies as of 01/04/2020   No Known Allergies     Medication List    STOP taking these  medications   furosemide 80 MG tablet Commonly known as: LASIX     TAKE these medications   albuterol 108 (90 Base) MCG/ACT inhaler Commonly known as: VENTOLIN HFA Inhale 2 puffs into the lungs every 6 (six) hours as needed for wheezing or shortness of breath.   atorvastatin 40 MG tablet Commonly known as: LIPITOR Take 1 tablet (40 mg total) by mouth daily at 6 PM.   blood glucose meter kit and supplies Dispense based on patient and insurance preference. Use up to four times daily as directed. (FOR ICD-10 E10.9, E11.9).   ferrous sulfate 325 (65 FE) MG EC tablet Take 1 tablet (325 mg total) by mouth daily with breakfast. What changed: when to take this   gabapentin 100 MG capsule Commonly known as: NEURONTIN TAKE 1 CAPSULE(100 MG) BY MOUTH TWICE DAILY What changed:   how much to take  how to take this  when to take this  additional instructions   glimepiride 2 MG tablet Commonly known as: AMARYL TAKE 1 TABLET(2 MG) BY MOUTH DAILY BEFORE BREAKFAST What changed: See the new instructions.   metFORMIN 850 MG tablet Commonly known as:  GLUCOPHAGE Take 1 tablet (850 mg total) by mouth 2 (two) times daily with a meal.   nitroGLYCERIN 0.4 MG SL tablet Commonly known as: NITROSTAT Place 1 tablet (0.4 mg total) under the tongue every 5 (five) minutes as needed for chest pain.   spironolactone 25 MG tablet Commonly known as: ALDACTONE Take 1 tablet (25 mg total) by mouth daily. What changed:   medication strength  how much to take   torsemide 20 MG tablet Commonly known as: DEMADEX Take 2 tablets (40 mg total) by mouth 2 (two) times daily.   umeclidinium-vilanterol 62.5-25 MCG/INH Aepb Commonly known as: Anoro Ellipta Inhale 1 puff into the lungs daily.      No Known Allergies Follow-up Information    [facility] Follow up.   Specialty: Home Health Services Why: agency will provide home health nurse. Contact information: [place] [address] [phone]        Richardson Dopp T, PA-C Follow up.   Specialties: Cardiology, Physician Assistant Why: Hospital follow-up scheduled for 5/19/201 at 2:15pm with Richardson Dopp, one of Dr. Elmarie Shiley PAs.  Contact information: [address] [place] [address] [phone]            The results of significant diagnostics from this hospitalization (including imaging, microbiology, ancillary and laboratory) are listed below for reference.    Significant Diagnostic Studies: DG Chest 2 View  Result Date: 12/26/2019 CLINICAL DATA:  Shortness of breath EXAM: CHEST - 2 VIEW COMPARISON:  10/31/2019 FINDINGS: Cardiac shadow is mildly enlarged but stable. Postsurgical changes are noted. Lungs are well aerated bilaterally. Mild vascular congestion again identified. Some slightly more prominent density is noted lateral aspect of the right in the minor fissure. This may represent some developing infiltrate. No other focal abnormality is noted. IMPRESSION: Changes consistent with mild vascular congestion. Some developing infiltrate in the right mid lung is noted. This may be related to the patient's known COVID status. Electronically Signed   By: Inez Catalina M.D.   On: 12/26/2019 21:07   CT Angio Chest PE W/Cm &/Or Wo Cm  Result Date: 12/27/2019 CLINICAL DATA:  Shortness of breath. History of COVID-19. EXAM: CT ANGIOGRAPHY CHEST WITH CONTRAST TECHNIQUE: Multidetector CT imaging of the chest was performed using the standard protocol during bolus administration of intravenous contrast. Multiplanar CT image reconstructions and MIPs were obtained to evaluate the vascular anatomy. CONTRAST:  198m OMNIPAQUE IOHEXOL 350 MG/ML SOLN COMPARISON:  None. FINDINGS: Cardiovascular: Contrast injection is sufficient to demonstrate satisfactory opacification of the pulmonary arteries to the segmental level. There is no pulmonary embolus or evidence of right heart strain. The  size of the main pulmonary artery is normal. Mild cardiomegaly. There are coronary artery calcifications. The course and caliber of the aorta are normal. There is mild atherosclerotic calcification. Opacification decreased due to pulmonary arterial phase contrast bolus timing. Mediastinum/Nodes: There are multiple calcified mediastinal and hilar lymph nodes likely due to prior granulomatous infection. Lungs/Pleura: There is a small right pleural effusion. Diffuse mild septal thickening. Upper Abdomen: Contrast bolus timing is not optimized for evaluation of the abdominal organs. There is hepatic cirrhosis with splenomegaly. Musculoskeletal: No chest wall abnormality. No bony spinal canal stenosis. Review of the MIP images confirms the above findings. IMPRESSION: 1. No pulmonary embolus or acute aortic syndrome. 2. Small right pleural effusion and mild pulmonary edema. 3. Hepatic cirrhosis and splenomegaly. 4. Aortic Atherosclerosis (ICD10-I70.0). Calcific coronary artery atherosclerosis. Electronically Signed  By: Ulyses Jarred M.D.   On: 12/27/2019 01:42   [place] ASCITES (ABDOMEN LIMITED)  Result Date: 12/27/2019 CLINICAL DATA:  Ascites search.  Cirrhotic findings by CT EXAM: LIMITED ABDOMEN ULTRASOUND FOR ASCITES TECHNIQUE: Limited ultrasound survey for ascites was performed in all four abdominal quadrants. COMPARISON:  Chest CT from earlier today FINDINGS: No visible ascites by 4 quadrant search.  No unexpected finding. IMPRESSION: Negative for ascites. Electronically Signed   By: Monte Fantasia M.D.   On: 12/27/2019 05:05    Microbiology: Recent Results (from the past 240 hour(s))  Respiratory Panel by RT PCR (Flu A&B, Covid) - Nasopharyngeal Swab     Status: Abnormal   Collection Time: 12/27/19  4:22 AM   Specimen: Nasopharyngeal Swab  Result Value Ref Range Status   SARS Coronavirus 2 by RT PCR POSITIVE (A) NEGATIVE Final    Comment: RESULT CALLED TO, READ BACK BY AND VERIFIED WITH: FREAKEY,J. RN  AT 7494 12/27/19 MULLINS,T (NOTE) SARS-CoV-2 target nucleic acids are DETECTED. SARS-CoV-2 RNA is generally detectable in upper respiratory specimens  during the acute phase of infection. Positive results are indicative of the presence of the identified virus, but do not rule out bacterial infection or co-infection with other pathogens not detected by the test. Clinical correlation with patient history and other diagnostic information is necessary to determine patient infection status. The expected result is Negative. Fact Sheet for Patients:  PinkCheek.be Fact Sheet for Healthcare Providers: GravelBags.it This test is not yet approved or cleared by the [place] FDA and  has been authorized for detection and/or diagnosis of SARS-CoV-2 by FDA under an Emergency Use Authorization (EUA).  This EUA will remain in effect (meaning this test can be [place] ed) for the duration of  the COVID-19 declaration under Section 564(b)(1) of the Act, 21 U.S.C. section 360bbb-3(b)(1), unless the authorization is terminated or revoked sooner.    Influenza A by PCR NEGATIVE NEGATIVE Final   Influenza B by PCR NEGATIVE NEGATIVE Final    Comment: (NOTE) The Xpert Xpress SARS-CoV-2/FLU/RSV assay is intended as an aid in  the diagnosis of influenza from Nasopharyngeal swab specimens and  should not be used as a sole basis for treatment. Nasal washings and  aspirates are unacceptable for Xpert Xpress SARS-CoV-2/FLU/RSV  testing. Fact Sheet for Patients: PinkCheek.be Fact Sheet for Healthcare Providers: GravelBags.it This test is not yet approved or cleared by the [place] FDA and  has been authorized for detection and/or diagnosis of SARS-CoV-2 by  FDA under an Emergency Use Authorization (EUA). This EUA will remain  in effect (meaning this test can be used) for the duration of the   Covid-19 declaration under Section 564(b)(1) of the Act, 21  U.S.C. section 360bbb-3(b)(1), unless the authorization is  terminated or revoked. Performed at [facility], [place] [address] [place], [place] [address]      Labs: Basic Metabolic Panel: Recent Labs  Lab 12/29/19 0506 12/29/19 0506 12/30/19 0556 12/30/19 0556 12/31/19 0550 01/01/20 0539 01/02/20 0538 01/03/20 0518 01/04/20 0655  NA 135   < > 137   < > 138 137 138 137 136  K 3.8   < > 3.8   < > 4.0 4.3 4.2 4.2 4.2  CL 102   < > 103   < > 101 100 99 99 99  CO2 26   < > 27   < > '28 28 30 28 27  ' GLUCOSE 137*   < > 112*   < >  103* 106* 103* 100* 99  BUN 26*   < > 26*   < > 25* 29* 30* 30* 28*  CREATININE 1.22   < > 1.23   < > 1.02 1.37* 1.30* 1.22 1.27*  CALCIUM 8.3*   < > 8.4*   < > 8.5* 8.4* 8.7* 8.8* 8.8*  MG 2.2  --  2.3  --   --   --   --   --   --    < > = values in this interval not displayed.   Liver Function Tests: Recent Labs  Lab 12/31/19 0550 01/02/20 0538 01/03/20 0518 01/04/20 0655  AST '25 25 24 26  ' ALT '18 17 19 20  ' ALKPHOS 93 112 115 117  BILITOT 0.8 0.7 0.8 0.8  PROT 7.1 7.3 7.0 7.1  ALBUMIN 3.8 3.9 3.9 4.0   No results for input(s): LIPASE, AMYLASE in the last 168 hours. No results for input(s): AMMONIA in the last 168 hours. CBC: Recent Labs  Lab 12/30/19 0556 12/31/19 0550 01/01/20 0539 01/02/20 0538 01/03/20 0518  WBC 3.3* 3.7* 3.7* 3.0* 3.0*  NEUTROABS  --   --   --  1.9 1.9  HGB 7.3* 7.5* 7.2* 7.2* 7.2*  HCT 26.6* 27.1* 26.4* 26.6* 26.1*  MCV 83.1 83.1 82.8 82.4 81.1  PLT 113* 129* 117* 115* 126*   Cardiac Enzymes: No results for input(s): CKTOTAL, CKMB, CKMBINDEX, TROPONINI in the last 168 hours. BNP: BNP (last 3 results) Recent Labs    07/29/19 2100 10/31/19 2035 12/26/19 2012  BNP 223.9* 181.0* 266.2*    ProBNP (last 3 results) No results for input(s): PROBNP in the last 8760 hours.  CBG: Recent Labs  Lab 01/03/20 0730 01/03/20 1137  01/03/20 1659 01/03/20 2103 01/04/20 0748  GLUCAP 93 148* 102* 112* 101*    Signed:  Nita Sells MD   [facility] 01/04/2020, 9:43 AM

## 2020-01-06 ENCOUNTER — Telehealth: Payer: Self-pay | Admitting: *Deleted

## 2020-01-06 NOTE — Telephone Encounter (Signed)
Pt was on the TCM report admitted 12/27/19 for increasing shortness of breath and bilateral lower extremity edema x2 weeks and admitted for HFpEF exacerbation. Pt D/c 01/04/20, and will follow-up w/cardiology  5/19/201 at 2:15pm with Richardson Dopp, one of Dr. Elmarie Shiley PAs...Johny Chess

## 2020-01-07 ENCOUNTER — Telehealth: Payer: Self-pay

## 2020-01-07 NOTE — Telephone Encounter (Signed)
Home Health Verbal Orders - Caller/Agency: [facility] Number: [phone] (can leave a voicemail) Requesting OT/PT/Skilled Nursing/Social Work/Speech Therapy: Nursing Frequency:  2x a week for 1 week 1x a week for 7 weeks

## 2020-01-07 NOTE — Telephone Encounter (Signed)
Signed by accident.

## 2020-01-08 NOTE — Telephone Encounter (Signed)
Called Beth told her the message. Beth said she will have patient or son to call to schedule appointment

## 2020-01-08 NOTE — Telephone Encounter (Signed)
See message.

## 2020-01-08 NOTE — Telephone Encounter (Signed)
We cannot sign off on any home health orders for him as he did not follow up from recent hospital visit and we have no recent face to face visit.

## 2020-01-10 ENCOUNTER — Ambulatory Visit: Payer: Medicare (Managed Care) | Admitting: Physician Assistant

## 2020-01-15 ENCOUNTER — Encounter: Payer: Self-pay | Admitting: Physician Assistant

## 2020-01-15 ENCOUNTER — Ambulatory Visit (INDEPENDENT_AMBULATORY_CARE_PROVIDER_SITE_OTHER): Payer: Medicare (Managed Care) | Admitting: Physician Assistant

## 2020-01-15 ENCOUNTER — Other Ambulatory Visit: Payer: Self-pay

## 2020-01-15 VITALS — BP 130/48 | HR 71 | Ht 68.0 in | Wt 237.8 lb

## 2020-01-15 DIAGNOSIS — K7460 Unspecified cirrhosis of liver: Secondary | ICD-10-CM | POA: Diagnosis not present

## 2020-01-15 DIAGNOSIS — I5032 Chronic diastolic (congestive) heart failure: Secondary | ICD-10-CM | POA: Diagnosis not present

## 2020-01-15 DIAGNOSIS — I10 Essential (primary) hypertension: Secondary | ICD-10-CM | POA: Diagnosis not present

## 2020-01-15 DIAGNOSIS — N1831 Chronic kidney disease, stage 3a: Secondary | ICD-10-CM

## 2020-01-15 DIAGNOSIS — D509 Iron deficiency anemia, unspecified: Secondary | ICD-10-CM

## 2020-01-15 DIAGNOSIS — I2510 Atherosclerotic heart disease of native coronary artery without angina pectoris: Secondary | ICD-10-CM

## 2020-01-15 DIAGNOSIS — G4733 Obstructive sleep apnea (adult) (pediatric): Secondary | ICD-10-CM

## 2020-01-15 NOTE — Patient Instructions (Addendum)
Medication Instructions:   Your physician recommends that you continue on your current medications as directed. Please refer to the Current Medication list given to you today.  *If you need a refill on your cardiac medications before your next appointment, please call your pharmacy*  Lab Work:  You will have labs drawn today: BMET  Testing/Procedures:  None ordered today  Follow-Up:  On 03/11/20 at 2:40PM with Mertie Moores, MD

## 2020-01-15 NOTE — Progress Notes (Signed)
Cardiology Office Note:    Date:  01/15/2020   ID:  [name], DOB [birth year], MRN [id number]  PCP:  [name], MD  Cardiologist:  Mertie Moores, MD  Electrophysiologist:  None   Referring MD: [name], *   Chief Complaint:  Hospitalization Follow-up (CHF)    Patient Profile:    [name] is a 74 y.o. male with:   Coronary artery disease  ? S/p DES to LAD in 2013 ? S/p CABG 05/2014  HFpEF   Echo 2/19: EF 60-65, mod LVH  Diabetes mellitus 2  Hypertension   Hyperlipidemia   Chronic kidney disease   Hx of GI bleed 09/2017  Hepatic cirrhosis ? Thrombocytopenia ? Heavy ETOH use  OSA  Chronic anemia  Prior CV studies: Echocardiogram 07/30/2019 EF 55-60, normal RV SF, moderate LAE, trace MR, trivial TR, RVSP 45.5  Echocardiogram 10/02/17 Mod LVH, EF 60-65, no RWMA, mild AI, mild MAC, mod BAE  Pre-CABG Dopplers 03/29/14 Summary:  Bilateral: 1-39% ICA stenosis. Vertebral artery flow is antegrade.  ABI is within normal limits with abnormal Doppler waveforms.   Cardiac catheterization 03/28/14 LM prox 30, dist 70-80 LAD ostial 60-70, patent prox stent LCx ostial 90, mid 80-90 RCA prox to mid 50 EF 60  History of Present Illness:    Mr. Villacis was last seen in clinic in August 2020.  He was admitted in November 2020 as well as March 2021 with decompensated diastolic heart failure.  He also contracted COVID-19 in March 2021.  Most recently, he was admitted 4/29-5/8 with decompensated diastolic heart failure.  He was followed by cardiology.  He was diuresed with IV furosemide.  He was -14 L and his discharge weight was 113.  He was discharged on torsemide.  He did have some acute kidney injury with diuresis during the admission.  He returns for follow-up.  He is here today with his wife.  His breathing is much better since discharge from the hospital.  He has not had orthopnea or PND.  His lower extremity swelling is improved.  He has  not had syncope or chest discomfort.  Past Medical History:  Diagnosis Date  . Arthritis    "joints tighten up sometimes" (03/27/2104)  . CHF (congestive heart failure) ([place])   . Chronic lower back pain   . Coronary artery disease    a. 05/2012 Cath/PCI: LM 40ost, 50-60d, LAD 99p ruptured plaque (3.0x28 DES), LCX 50p/m, RCA 30-40p, 44m EF 65-70%.  . Coronary artery disease involving native coronary artery of native heart without angina pectoris    Severe left main disease at catheterization July 2015  CABG x3 with a LIMA to the LAD, SVG to the OM, SVG to the PDA on 03/31/14. EF 60% by cath.   . GERD without esophagitis 08/04/2010  . Hepatic cirrhosis ([place]    a. Dx 01/2014 - CT a/p   . History of blood transfusion    "related to bleeding ulcers"  . History of concussion    1976--  NO RESIDUAL  . History of GI bleed    a. UGIB 07/2012;  b. 01/2014 admission with GIB/FOB stool req 1U prbc's->EGD showed portal gastropathy, barrett's esoph, and chronic active h. pylori gastritis.  .Marland KitchenHistory of gout    2007 &  2008  LEFT LEG-- NO ISSUE SINCE  . Hyperlipidemia   . Iron deficiency anemia   . Kidney stones   . OA (osteoarthritis of spine)    LOWER BACK--  INTERMITTANT LEFT LEG NUMBNESS  . OSA (obstructive sleep apnea)    PULMOLOGIST-  DR CLANCE--  MODERATE OSA  STARTED CPAP 2012--  BUT CURRENTLY HAS NOT USED PAST 6 MONTHS  . Phimosis    a. s/p circumcision 2015.  . Type 2 diabetes mellitus ([place])   . Unspecified essential hypertension     Current Medications: Current Meds  Medication Sig  . albuterol (VENTOLIN HFA) 108 (90 Base) MCG/ACT inhaler Inhale 2 puffs into the lungs every 6 (six) hours as needed for wheezing or shortness of breath.  Marland Kitchen atorvastatin (LIPITOR) 40 MG tablet Take 1 tablet (40 mg total) by mouth daily at 6 PM.  . blood glucose meter kit and supplies Dispense based on patient and insurance preference. Use up to four times daily as directed. (FOR ICD-10 E10.9, E11.9).  .  ferrous sulfate 325 (65 FE) MG EC tablet Take 1 tablet (325 mg total) by mouth daily with breakfast.  . gabapentin (NEURONTIN) 100 MG capsule TAKE 1 CAPSULE(100 MG) BY MOUTH TWICE DAILY  . glimepiride (AMARYL) 2 MG tablet TAKE 1 TABLET(2 MG) BY MOUTH DAILY BEFORE BREAKFAST  . metFORMIN (GLUCOPHAGE) 850 MG tablet Take 1 tablet (850 mg total) by mouth 2 (two) times daily with a meal.  . nitroGLYCERIN (NITROSTAT) 0.4 MG SL tablet Place 1 tablet (0.4 mg total) under the tongue every 5 (five) minutes as needed for chest pain.  Marland Kitchen spironolactone (ALDACTONE) 25 MG tablet Take 1 tablet (25 mg total) by mouth daily.  Marland Kitchen torsemide (DEMADEX) 20 MG tablet Take 2 tablets (40 mg total) by mouth 2 (two) times daily.  Marland Kitchen umeclidinium-vilanterol (ANORO ELLIPTA) 62.5-25 MCG/INH AEPB Inhale 1 puff into the lungs daily.     Allergies:   Patient has no known allergies.   Social History   Tobacco Use  . Smoking status: Former Smoker    Packs/day: 2.00    Years: 40.00    Pack years: 80.00    Types: Cigarettes    Quit date: 08/29/1996    Years since quitting: 23.3  . Smokeless tobacco: Never Used  Substance Use Topics  . Alcohol use: Yes    Alcohol/week: 8.0 standard drinks    Types: 8 Cans of beer per week    Comment: 2 beers every other day  . Drug use: No     Family Hx: The patient's family history includes Cancer in his brother, father, mother, sister, and sister; Colon cancer in an other family member; Coronary artery disease in an other family member; Diabetes in an other family member; Lung cancer in his sister.  ROS   EKGs/Labs/Other Test Reviewed:    EKG:  EKG is  ordered today.  The ekg ordered today demonstrates normal sinus rhythm, heart rate 71, normal axis, incomplete right bundle branch block, first-degree AV block, PR interval 224 ms, QTC 436 ms, no significant change compared to prior tracing  Recent Labs: 12/26/2019: B Natriuretic Peptide 266.2 12/28/2019: TSH 2.761 12/30/2019: Magnesium  2.3 01/03/2020: Hemoglobin 7.2; Platelets 126 01/04/2020: ALT 20; BUN 28; Creatinine, Ser 1.27; Potassium 4.2; Sodium 136   Recent Lipid Panel Lab Results  Component Value Date/Time   CHOL 150 11/01/2019 10:30 AM   TRIG 69 11/01/2019 10:30 AM   HDL 38 (L) 11/01/2019 10:30 AM   CHOLHDL 3.9 11/01/2019 10:30 AM   LDLCALC 98 11/01/2019 10:30 AM    Physical Exam:    VS:  BP (!) 130/48   Pulse 71   Ht 5' 8" (1.727 m)  Wt 237 lb 12.8 oz (107.9 kg)   SpO2 96%   BMI 36.16 kg/m     Wt Readings from Last 3 Encounters:  01/15/20 237 lb 12.8 oz (107.9 kg)  01/04/20 246 lb 6.4 oz (111.8 kg)  11/04/19 236 lb 12.4 oz (107.4 kg)     Constitutional:      Appearance: Healthy appearance. Not in distress.  Neck:     Thyroid: No thyromegaly.     Vascular: JVD normal.  Pulmonary:     Effort: Pulmonary effort is normal.     Breath sounds: No wheezing. No rales.  Cardiovascular:     Normal rate. Regular rhythm. Normal S1. Normal S2.     Murmurs: There is no murmur.  Edema:    Peripheral edema present.    Pretibial: bilateral 3+ edema of the pretibial area.    Ankle: bilateral 3+ edema of the ankle.    Feet: bilateral 2+ edema of the feet. Abdominal:     Palpations: Abdomen is soft.  Skin:    General: Skin is warm and dry.  Neurological:     General: No focal deficit present.     Mental Status: Alert and oriented to person, place and time.      ASSESSMENT & PLAN:    1. Chronic heart failure with preserved ejection fraction [facility]) He has had several admissions to the hospital with decompensated diastolic heart failure.  Echocardiogram in December 2020 with EF 55-60.  Volume status is currently stable.  His breathing is overall improved.  He still has significant lower extremity swelling.  Continue current dose of torsemide and spironolactone.  Obtain follow-up BMP today.  I have encouraged him to use Ace wrap for compression to improve his lower extremity swelling.  Follow-up with Dr.  Acie Fredrickson or me in 8 weeks.  2. Coronary artery disease involving native coronary artery of native heart without angina pectoris History of CABG in 2015.  He is not having anginal symptoms.  He is not on aspirin due to bleeding risk.  Continue high intensity statin therapy.  3. Essential hypertension Fair control.  Continue current management.  4. Hepatic cirrhosis, unspecified hepatic cirrhosis type, unspecified whether ascites present [facility]) Continue follow-up with primary care.  5. Stage 3a chronic kidney disease Obtain follow-up BMP today.  6. Iron deficiency anemia, unspecified iron deficiency anemia type Recent hemoglobin 7.2.  Continue iron and follow-up with primary care.  7. OSA (obstructive sleep apnea) He notes inadequate equipment at home as well as issues with his electrical grid.  Once he has those issues addressed, if he continues to have issues with his machine, I have asked him to contact us.  At that point, I would refer him to Dr. Radford Pax for further evaluation management of his sleep apnea.    Dispo:  Return in about 8 weeks (around 03/11/2020) for Routine Follow Up, w/ Dr. Acie Fredrickson, or Richardson Dopp, PA-C, in person.   Medication Adjustments/Labs and Tests Ordered: Current medicines are reviewed at length with the patient today.  Concerns regarding medicines are outlined above.  Tests Ordered: Orders Placed This Encounter  Procedures  . Basic metabolic panel  . EKG 12-Lead   Medication Changes: No orders of the defined types were placed in this encounter.   Signed, Richardson Dopp, PA-C  01/15/2020 5:52 PM    [facility] [address], [place], [place]  [address] Phone: [phone]; Fax: [phone]

## 2020-01-16 ENCOUNTER — Ambulatory Visit: Payer: Medicare (Managed Care) | Admitting: Internal Medicine

## 2020-01-16 ENCOUNTER — Other Ambulatory Visit: Payer: Self-pay

## 2020-01-16 ENCOUNTER — Encounter: Payer: Self-pay | Admitting: Internal Medicine

## 2020-01-16 VITALS — BP 128/76 | HR 68 | Temp 98.6°F | Ht 68.0 in | Wt 238.2 lb

## 2020-01-16 DIAGNOSIS — D62 Acute posthemorrhagic anemia: Secondary | ICD-10-CM

## 2020-01-16 DIAGNOSIS — K7460 Unspecified cirrhosis of liver: Secondary | ICD-10-CM | POA: Diagnosis not present

## 2020-01-16 DIAGNOSIS — I5032 Chronic diastolic (congestive) heart failure: Secondary | ICD-10-CM | POA: Diagnosis not present

## 2020-01-16 LAB — BASIC METABOLIC PANEL
BUN/Creatinine Ratio: 32 — ABNORMAL HIGH (ref 10–24)
BUN: 36 mg/dL — ABNORMAL HIGH (ref 8–27)
CO2: 23 mmol/L (ref 20–29)
Calcium: 8.9 mg/dL (ref 8.6–10.2)
Chloride: 106 mmol/L (ref 96–106)
Creatinine, Ser: 1.14 mg/dL (ref 0.76–1.27)
GFR calc Af Amer: 73 mL/min/{1.73_m2} (ref >59)
GFR calc non Af Amer: 63 mL/min/{1.73_m2} (ref >59)
Glucose: 122 mg/dL — ABNORMAL HIGH (ref 65–99)
Potassium: 4.5 mmol/L (ref 3.5–5.2)
Sodium: 142 mmol/L (ref 134–144)

## 2020-01-16 MED ORDER — FERROUS SULFATE 325 (65 FE) MG PO TBEC: 325.0000 mg | DELAYED_RELEASE_TABLET | Freq: Every day | ORAL | 3 refills | Status: DC

## 2020-01-16 NOTE — Progress Notes (Signed)
   Subjective:   Patient ID: [name], male    DOB: 11/11/1945, 74 y.o.   MRN: [id number]  HPI The patient is a 74 YO man coming in for hospital follow up (in for acute heart failure, with stable EF, concurrent cirrhosis, diuresed about 14L fluid). He did stop taking all his fluid pills about 2-3 days ago due to feeling a pain in the back and worrying that this could be his kidneys. He is monitoring sodium some and is trying to cut back. Since stopping fluid pills he is up some weight and swelling in his feet/ankles. Denies SOB but feels abdomen and feet are increasing in size. He has not taken any meds yet today due to coming to visit. Had labs with cardiology yesterday and wants to know results of that. Denies blood in stool, dark stools.   Review of Systems  Constitutional: Positive for activity change.  HENT: Negative.   Eyes: Negative.   Respiratory: Negative for cough, chest tightness and shortness of breath.   Cardiovascular: Positive for leg swelling. Negative for chest pain and palpitations.  Gastrointestinal: Positive for abdominal distention. Negative for abdominal pain, constipation, diarrhea, nausea and vomiting.  Musculoskeletal: Negative.   Skin: Negative.   Neurological: Negative.   Psychiatric/Behavioral: Negative.     Objective:  Physical Exam Constitutional:      Appearance: He is well-developed. He is obese.  HENT:     Head: Normocephalic and atraumatic.  Cardiovascular:     Rate and Rhythm: Normal rate and regular rhythm.  Pulmonary:     Effort: Pulmonary effort is normal. No respiratory distress.     Breath sounds: Normal breath sounds. No wheezing or rales.  Abdominal:     General: Bowel sounds are normal. There is no distension.     Palpations: Abdomen is soft.     Tenderness: There is no abdominal tenderness. There is no rebound.     Comments: No fluid wave but distention  Musculoskeletal:     Cervical back: Normal range of motion.     Right lower  leg: Edema present.     Left lower leg: Edema present.     Comments: 2+ edema to the knees bilaterally, wearing compression stockings  Skin:    General: Skin is warm and dry.  Neurological:     Mental Status: He is alert and oriented to person, place, and time.     Coordination: Coordination normal.     Vitals:   01/16/20 1544  BP: 128/76  Pulse: 68  Temp: 98.6 F (37 C)  SpO2: 99%  Weight: 238 lb 3.2 oz (108 kg)  Height: 5\' 8"  (1.727 m)    This visit occurred during the SARS-CoV-2 public health emergency.  Safety protocols were in place, including screening questions prior to the visit, additional usage of staff PPE, and extensive cleaning of exam room while observing appropriate contact time as indicated for disinfecting solutions.   Assessment & Plan:

## 2020-01-16 NOTE — Patient Instructions (Signed)
Make sure to start back the fluid pill to get some of the fluid off.

## 2020-01-16 NOTE — Telephone Encounter (Signed)
Called and gave verbal orders to [place]. She said they will go out into his home starting 01-20-2020

## 2020-01-16 NOTE — Telephone Encounter (Signed)
Okay to call and approve.

## 2020-01-17 NOTE — Assessment & Plan Note (Signed)
Hg low during recent hospital stay and he will resume iron daily. Levels were stable and he denies any signs/symptoms of bleeding.

## 2020-01-17 NOTE — Assessment & Plan Note (Signed)
We talked about how this impacts his fluid status along with the heart failure. He does have low platelets and stable symptoms. Does not require paracentesis. Has not seen GI in some time and is recommended he follow up with them routinely.

## 2020-01-17 NOTE — Assessment & Plan Note (Signed)
Starting to be in early flare due to not taking medications. Reviewed labs with him from yesterday. Advised him not to ever stop taking medications without calling his heart doctor or [place] for advice. Explained the serious nature and his repeated hospitalizations due to medication and dietary non-compliance. He is thinking about sodium intake more but diet is still not ideal. Resume fluid pills and call in 2-3 days if not losing fluid as appropriate.

## 2020-01-20 ENCOUNTER — Telehealth: Payer: Self-pay | Admitting: Cardiovascular Disease

## 2020-01-20 DIAGNOSIS — N1831 Chronic kidney disease, stage 3a: Secondary | ICD-10-CM

## 2020-01-20 DIAGNOSIS — I5032 Chronic diastolic (congestive) heart failure: Secondary | ICD-10-CM

## 2020-01-20 DIAGNOSIS — I10 Essential (primary) hypertension: Secondary | ICD-10-CM

## 2020-01-20 NOTE — Telephone Encounter (Signed)
Patient's son calling for lab results.

## 2020-01-20 NOTE — Telephone Encounter (Signed)
The patients son [name] has been notified of the result and verbalized understanding. Ok per patient DPR to speak with [name]. All questions (if any) were answered. [name] will have patient change Torsemide dose to every Monday, Wednesday, and Friday 40 mg in the AM and 20 mg in the PM. Patient will take 40 mg twice a day all other days. Orders in for repeat labs on 01/28/20. Mady Haagensen, [place] 01/20/2020 3:21 PM

## 2020-01-28 ENCOUNTER — Other Ambulatory Visit: Payer: Self-pay

## 2020-01-28 ENCOUNTER — Other Ambulatory Visit: Payer: Medicare (Managed Care) | Admitting: *Deleted

## 2020-01-28 DIAGNOSIS — N1831 Chronic kidney disease, stage 3a: Secondary | ICD-10-CM

## 2020-01-28 DIAGNOSIS — I10 Essential (primary) hypertension: Secondary | ICD-10-CM

## 2020-01-28 DIAGNOSIS — I5032 Chronic diastolic (congestive) heart failure: Secondary | ICD-10-CM

## 2020-01-29 LAB — BASIC METABOLIC PANEL
BUN/Creatinine Ratio: 27 — ABNORMAL HIGH (ref 10–24)
BUN: 31 mg/dL — ABNORMAL HIGH (ref 8–27)
CO2: 24 mmol/L (ref 20–29)
Calcium: 9.2 mg/dL (ref 8.6–10.2)
Chloride: 101 mmol/L (ref 96–106)
Creatinine, Ser: 1.15 mg/dL (ref 0.76–1.27)
GFR calc Af Amer: 73 mL/min/{1.73_m2} (ref >59)
GFR calc non Af Amer: 63 mL/min/{1.73_m2} (ref >59)
Glucose: 88 mg/dL (ref 65–99)
Potassium: 4.6 mmol/L (ref 3.5–5.2)
Sodium: 140 mmol/L (ref 134–144)

## 2020-02-06 ENCOUNTER — Telehealth: Payer: Self-pay | Admitting: Cardiovascular Disease

## 2020-02-06 MED ORDER — TORSEMIDE 20 MG PO TABS: ORAL_TABLET | ORAL | 3 refills | Status: DC

## 2020-02-06 NOTE — Telephone Encounter (Signed)
Attempted to call patient, left voice mail for patient to call back.  Spoke with Eustaquio Maize, RN with [name] who reports patient's weight has been increasing over the past week. She states the patient denies changes in diet, orthopnea, SOB, or PND. He denies changes in activity or other concerns. She states he does not elevate his legs when he is immobile and has been instructed multiple times to do so. Patient's son prepares his medications and is the one that [facility] usually speaks with about medicine changes. Son's phone number is the one listed in our chart. I advised that I will review with Dr. Acie Fredrickson and call patient's son to report any changes in therapy.  Patient's son called me back and upon review of torsemide instructions, the son had misunderstood the instructions given by Raquel Sarna, CMA for Richardson Dopp, PA on 5/19. He had reduced the patient's torsemide dose to 20 mg twice daily except on M, W, F he was giving the pt 20 mg QD. I asked son to get medicine bottle and verified that he understands to give patient torsemide 40 mg BID except M, W, F, to give torsemide 40 mg in the am and 20 mg in the pm. He requests a refill of the medication which I have provided. I advised son to call back with any questions prior to next visit by [facility]. He thanked me for the call.   I called back and advised Beth, RN about the misunderstanding. She updated her records and thanked me for the call.

## 2020-02-06 NOTE — Telephone Encounter (Signed)
Addendum to previous note Beth advised that patient had clear lung sounds and no clinical signs of heart failure when she assessed him earlier.

## 2020-02-06 NOTE — Telephone Encounter (Signed)
  Pt c/o swelling: STAT is pt has developed SOB within 24 hours  1) How much weight have you gained and in what time span?   4 lbs in a day and 7 lbs in a week 2) If swelling, where is the swelling located? Below the knee  3) Are you currently taking a fluid pill? yes  4) Are you currently SOB? no  5) Do you have a log of your daily weights (if so, list)? 6/1 231.4, 6/2 233, 6/3, 234, 6/4 234, 6/7 237.2, 6/8 236, 6/9 236, today 240.2  6) Have you gained 3 pounds in a day or 5 pounds in a week? Yes, 4 lbs in a day and 7 lbs in a week  7) Have you traveled recently? No   Beth from [place] calling stating the patient has 4 lbs in a day and 7 lbs in a week, but is not having any new symptoms. She would like a nurse to call the patient back.

## 2020-02-11 ENCOUNTER — Telehealth: Payer: Self-pay | Admitting: Cardiovascular Disease

## 2020-02-11 NOTE — Telephone Encounter (Signed)
Spoke with pt and pt's son pt still has swelling per pt this  is getting better but slowly .Pt currently taking Torsemide 40 mg bid except MWF takes 20 mg in the pm Per pt also for 2-3 days has had leg cramps Instructed to increase Potassium rich foods Last K was 4.6 on 01/28/20 Will forward to Dr Acie Fredrickson for review and recommendations .,/cy

## 2020-02-11 NOTE — Telephone Encounter (Signed)
Pt c/o medication issue:  1. Name of Medication: torsemide (DEMADEX) 20 MG tablet  2. How are you currently taking this medication (dosage and times per day)? As directed  3. Are you having a reaction (difficulty breathing--STAT)? yes  4. What is your medication issue? Beth with [facility] states that the patient was not taking the medication correctly last week when she was visiting him. She visited the patient today and he has been taking the medication correctly since last week but states that the patient is still retaining some fluid in his feet and ankles. She says that the patient states that since this medication has been increased his legs have been cramping. She would like a call back from Dr. Elmarie Shiley nurse in regards to what options are available for this patient.

## 2020-02-13 NOTE — Telephone Encounter (Signed)
Have him continue to limit his salt intake.  Use compression hose, leg elevation. Perhaps consider getting a lounge doctor leg rest which can help with leg swelling.

## 2020-02-13 NOTE — Telephone Encounter (Signed)
Left message for patient's son to call back to discuss leg swelling

## 2020-02-18 ENCOUNTER — Ambulatory Visit (INDEPENDENT_AMBULATORY_CARE_PROVIDER_SITE_OTHER): Payer: Medicare (Managed Care) | Admitting: Podiatry

## 2020-02-18 ENCOUNTER — Other Ambulatory Visit: Payer: Self-pay

## 2020-02-18 ENCOUNTER — Encounter: Payer: Self-pay | Admitting: Podiatry

## 2020-02-18 VITALS — BP 146/70 | HR 73

## 2020-02-18 DIAGNOSIS — W25XXXA Contact with sharp glass, initial encounter: Secondary | ICD-10-CM | POA: Diagnosis not present

## 2020-02-18 DIAGNOSIS — M2141 Flat foot [pes planus] (acquired), right foot: Secondary | ICD-10-CM | POA: Diagnosis not present

## 2020-02-18 DIAGNOSIS — E1142 Type 2 diabetes mellitus with diabetic polyneuropathy: Secondary | ICD-10-CM | POA: Diagnosis not present

## 2020-02-18 DIAGNOSIS — M79674 Pain in right toe(s): Secondary | ICD-10-CM | POA: Diagnosis not present

## 2020-02-18 DIAGNOSIS — T148XXA Other injury of unspecified body region, initial encounter: Secondary | ICD-10-CM | POA: Diagnosis not present

## 2020-02-18 DIAGNOSIS — L84 Corns and callosities: Secondary | ICD-10-CM | POA: Diagnosis not present

## 2020-02-18 DIAGNOSIS — M79675 Pain in left toe(s): Secondary | ICD-10-CM

## 2020-02-18 DIAGNOSIS — B351 Tinea unguium: Secondary | ICD-10-CM | POA: Diagnosis not present

## 2020-02-18 DIAGNOSIS — M2142 Flat foot [pes planus] (acquired), left foot: Secondary | ICD-10-CM

## 2020-02-18 MED ORDER — MUPIROCIN 2 % EX OINT: TOPICAL_OINTMENT | CUTANEOUS | 0 refills | Status: AC

## 2020-02-18 NOTE — Patient Instructions (Addendum)
Apply Mupirocin Ointment to left foot once daily for 2 weeks.  Diabetes Mellitus and Foot Care Foot care is an important part of your health, especially when you have diabetes. Diabetes may cause you to have problems because of poor blood flow (circulation) to your feet and legs, which can cause your skin to:  Become thinner and drier.  Break more easily.  Heal more slowly.  Peel and crack. You may also have nerve damage (neuropathy) in your legs and feet, causing decreased feeling in them. This means that you may not notice minor injuries to your feet that could lead to more serious problems. Noticing and addressing any potential problems early is the best way to prevent future foot problems. How to care for your feet Foot hygiene  Wash your feet daily with warm water and mild soap. Do not use hot water. Then, pat your feet and the areas between your toes until they are completely dry. Do not soak your feet as this can dry your skin.  Trim your toenails straight across. Do not dig under them or around the cuticle. File the edges of your nails with an emery board or nail file.  Apply a moisturizing lotion or petroleum jelly to the skin on your feet and to dry, brittle toenails. Use lotion that does not contain alcohol and is unscented. Do not apply lotion between your toes. Shoes and socks  Wear clean socks or stockings every day. Make sure they are not too tight. Do not wear knee-high stockings since they may decrease blood flow to your legs.  Wear shoes that fit properly and have enough cushioning. Always look in your shoes before you put them on to be sure there are no objects inside.  To break in new shoes, wear them for just a few hours a day. This prevents injuries on your feet. Wounds, scrapes, corns, and calluses  Check your feet daily for blisters, cuts, bruises, sores, and redness. If you cannot see the bottom of your feet, use a mirror or ask someone for help.  Do not cut  corns or calluses or try to remove them with medicine.  If you find a minor scrape, cut, or break in the skin on your feet, keep it and the skin around it clean and dry. You may clean these areas with mild soap and water. Do not clean the area with peroxide, alcohol, or iodine.  If you have a wound, scrape, corn, or callus on your foot, look at it several times a day to make sure it is healing and not infected. Check for: ? Redness, swelling, or pain. ? Fluid or blood. ? Warmth. ? Pus or a bad smell. General instructions  Do not cross your legs. This may decrease blood flow to your feet.  Do not use heating pads or hot water bottles on your feet. They may burn your skin. If you have lost feeling in your feet or legs, you may not know this is happening until it is too late.  Protect your feet from hot and cold by wearing shoes, such as at the beach or on hot pavement.  Schedule a complete foot exam at least once a year (annually) or more often if you have foot problems. If you have foot problems, report any cuts, sores, or bruises to your health care provider immediately. Contact a health care provider if:  You have a medical condition that increases your risk of infection and you have any cuts, sores, or  bruises on your feet.  You have an injury that is not healing.  You have redness on your legs or feet.  You feel burning or tingling in your legs or feet.  You have pain or cramps in your legs and feet.  Your legs or feet are numb.  Your feet always feel cold.  You have pain around a toenail. Get help right away if:  You have a wound, scrape, corn, or callus on your foot and: ? You have pain, swelling, or redness that gets worse. ? You have fluid or blood coming from the wound, scrape, corn, or callus. ? Your wound, scrape, corn, or callus feels warm to the touch. ? You have pus or a bad smell coming from the wound, scrape, corn, or callus. ? You have a fever. ? You have a  red line going up your leg. Summary  Check your feet every day for cuts, sores, red spots, swelling, and blisters.  Moisturize feet and legs daily.  Wear shoes that fit properly and have enough cushioning.  If you have foot problems, report any cuts, sores, or bruises to your health care provider immediately.  Schedule a complete foot exam at least once a year (annually) or more often if you have foot problems. This information is not intended to replace advice given to you by your health care provider. Make sure you discuss any questions you have with your health care provider. Document Revised: 05/08/2019 Document Reviewed: 09/16/2016 Elsevier Patient Education  [place] are small areas of thickened skin that occur on the top, sides, or tip of a toe. They contain a cone-shaped core with a point that can press on a nerve below. This causes pain.  Calluses are areas of thickened skin that can occur anywhere on the body, including the hands, fingers, palms, soles of the feet, and heels. Calluses are usually larger than corns. What are the causes? Corns and calluses are caused by rubbing (friction) or pressure, such as from shoes that are too tight or do not fit properly. What increases the risk? Corns are more likely to develop in people who have misshapen toes (toe deformities), such as hammer toes. Calluses can occur with friction to any area of the skin. They are more likely to develop in people who:  Work with their hands.  Wear shoes that fit poorly, are too tight, or are high-heeled.  Have toe deformities. What are the signs or symptoms? Symptoms of a corn or callus include:  A hard growth on the skin.  Pain or tenderness under the skin.  Redness and swelling.  Increased discomfort while wearing tight-fitting shoes, if your feet are affected. If a corn or callus becomes infected, symptoms may include:  Redness and swelling that gets  worse.  Pain.  Fluid, blood, or pus draining from the corn or callus. How is this diagnosed? Corns and calluses may be diagnosed based on your symptoms, your medical history, and a physical exam. How is this treated? Treatment for corns and calluses may include:  Removing the cause of the friction or pressure. This may involve: ? Changing your shoes. ? Wearing shoe inserts (orthotics) or other protective layers in your shoes, such as a corn pad. ? Wearing gloves.  Applying medicine to the skin (topical medicine) to help soften skin in the hardened, thickened areas.  Removing layers of dead skin with a file to reduce the size of the corn or callus.  Removing the corn or callus with a scalpel or laser.  Taking antibiotic medicines, if your corn or callus is infected.  Having surgery, if a toe deformity is the cause. Follow these instructions at home:   Take over-the-counter and prescription medicines only as told by your health care provider.  If you were prescribed an antibiotic, take it as told by your health care provider. Do not stop taking it even if your condition starts to improve.  Wear shoes that fit well. Avoid wearing high-heeled shoes and shoes that are too tight or too loose.  Wear any padding, protective layers, gloves, or orthotics as told by your health care provider.  Soak your hands or feet and then use a file or pumice stone to soften your corn or callus. Do this as told by your health care provider.  Check your corn or callus every day for symptoms of infection. Contact a health care provider if you:  Notice that your symptoms do not improve with treatment.  Have redness or swelling that gets worse.  Notice that your corn or callus becomes painful.  Have fluid, blood, or pus coming from your corn or callus.  Have new symptoms. Summary  Corns are small areas of thickened skin that occur on the top, sides, or tip of a toe.  Calluses are areas of  thickened skin that can occur anywhere on the body, including the hands, fingers, palms, and soles of the feet. Calluses are usually larger than corns.  Corns and calluses are caused by rubbing (friction) or pressure, such as from shoes that are too tight or do not fit properly.  Treatment may include wearing any padding, protective layers, gloves, or orthotics as told by your health care provider. This information is not intended to replace advice given to you by your health care provider. Make sure you discuss any questions you have with your health care provider. Document Revised: 12/05/2018 Document Reviewed: 06/28/2017 Else[employer] Patient Education  2020 [place].  Edema  Edema is an abnormal buildup of fluids in the body tissues and under the skin. Swelling of the legs, feet, and ankles is a common symptom that becomes more likely as you get older. Swelling is also common in looser tissues, like around the eyes. When the affected area is squeezed, the fluid may move out of that spot and leave a dent for a few moments. This dent is called pitting edema. There are many possible causes of edema. Eating too much salt (sodium) and being on your feet or sitting for a long time can cause edema in your legs, feet, and ankles. Hot weather may make edema worse. Common causes of edema include:  Heart failure.  Liver or kidney disease.  Weak leg blood vessels.  Cancer.  An injury.  Pregnancy.  Medicines.  Being obese.  Low protein levels in the blood. Edema is usually painless. Your skin may look swollen or shiny. Follow these instructions at home:  Keep the affected body part raised (elevated) above the level of your heart when you are sitting or lying down.  Do not sit still or stand for long periods of time.  Do not wear tight clothing. Do not wear garters on your upper legs.  Exercise your legs to get your circulation going. This helps to move the fluid back into your blood  vessels, and it may help the swelling go down.  Wear elastic bandages or support stockings to reduce swelling as told by your health care  provider.  Eat a low-salt (low-sodium) diet to reduce fluid as told by your health care provider.  Depending on the cause of your swelling, you may need to limit how much fluid you drink (fluid restriction).  Take over-the-counter and prescription medicines only as told by your health care provider. Contact a health care provider if:  Your edema does not get better with treatment.  You have heart, liver, or kidney disease and have symptoms of edema.  You have sudden and unexplained weight gain. Get help right away if:  You develop shortness of breath or chest pain.  You cannot breathe when you lie down.  You develop pain, redness, or warmth in the swollen areas.  You have heart, liver, or kidney disease and suddenly get edema.  You have a fever and your symptoms suddenly get worse. Summary  Edema is an abnormal buildup of fluids in the body tissues and under the skin.  Eating too much salt (sodium) and being on your feet or sitting for a long time can cause edema in your legs, feet, and ankles.  Keep the affected body part raised (elevated) above the level of your heart when you are sitting or lying down. This information is not intended to replace advice given to you by your health care provider. Make sure you discuss any questions you have with your health care provider. Document Revised: 01/02/2019 Document Reviewed: 09/17/2016 Elsevier Patient Education  [place].

## 2020-02-19 NOTE — Progress Notes (Signed)
Subjective: [name] presents today referred by [name], MD for diabetic foot evaluation.  Patient relates 4 year history of diabetes.  Patient denies any history of foot wounds.  Patient denies any history of numbness, tingling, burning, pins/needles sensations.  Today, patient c/o of painful calluses and discolored, thick toenails which interfere with daily activities.  Pain is aggravated when weightbearing and wearing enclosed shoe gear respectively. Patient's wife has attempted trimming toenails. He states she cut the left 4th digit nail too close and he bled a little.   He wears flip flops due to being unable to wear any shoe gear due to his lower extremity edema.   His son is present during today's visit.   Past Medical History:  Diagnosis Date  . Arthritis    "joints tighten up sometimes" (03/27/2104)  . CHF (congestive heart failure) ([place])   . Chronic lower back pain   . Coronary artery disease    a. 05/2012 Cath/PCI: LM 40ost, 50-60d, LAD 99p ruptured plaque (3.0x28 DES), LCX 50p/m, RCA 30-40p, 52m EF 65-70%.  . Coronary artery disease involving native coronary artery of native heart without angina pectoris    Severe left main disease at catheterization July 2015  CABG x3 with a LIMA to the LAD, SVG to the OM, SVG to the PDA on 03/31/14. EF 60% by cath.   . GERD without esophagitis 08/04/2010  . Hepatic cirrhosis ([place]    a. Dx 01/2014 - CT a/p   . History of blood transfusion    "related to bleeding ulcers"  . History of concussion    1976--  NO RESIDUAL  . History of GI bleed    a. UGIB 07/2012;  b. 01/2014 admission with GIB/FOB stool req 1U prbc's->EGD showed portal gastropathy, barrett's esoph, and chronic active h. pylori gastritis.  .Marland KitchenHistory of gout    2007 &  2008  LEFT LEG-- NO ISSUE SINCE  . Hyperlipidemia   . Iron deficiency anemia   . Kidney stones   . OA (osteoarthritis of spine)    LOWER BACK--  INTERMITTANT LEFT LEG NUMBNESS  . OSA  (obstructive sleep apnea)    PULMOLOGIST-  DR CLANCE--  MODERATE OSA  STARTED CPAP 2012--  BUT CURRENTLY HAS NOT USED PAST 6 MONTHS  . Phimosis    a. s/p circumcision 2015.  . Type 2 diabetes mellitus ([place]   . Unspecified essential hypertension     Patient Active Problem List   Diagnosis Date Noted  . Type 2 diabetes mellitus with hyperglycemia, without long-term current use of insulin ([place] 12/27/2019  . Asthma, mild intermittent 12/27/2019  . Acute on chronic diastolic (congestive) heart failure ([facility] 12/27/2019  . Alcoholic cirrhosis of liver with ascites ([place] 12/27/2019  . Acute exacerbation of CHF (congestive heart failure) ([place] 12/27/2019  . Acute on chronic diastolic CHF (congestive heart failure) ([facility] 11/01/2019  . Volume overload 07/29/2019  . Itching 03/22/2019  . Skin lesion 03/22/2019  . Hyponatremia 12/24/2018  . (HFpEF) heart failure with preserved ejection fraction ([place] 12/24/2018  . Precordial chest pain   . Neck pain 09/12/2018  . CKD (chronic kidney disease), stage III 12/18/2017  . Acute on chronic anemia 12/18/2017  . Peptic ulcer disease 12/18/2017  . GI bleed 09/29/2017  . Shortness of breath 09/21/2017  . Morbid obesity ([facility] 02/05/2016  . Numerous moles 09/15/2015  . S/P CABG x 3 03/31/2014  . Hepatic cirrhosis (HSuccess 02/27/2014  . Acute blood loss anemia 08/13/2012  .  Hyperlipidemia with target LDL less than 70 06/29/2012  . Thrombocytopenia ([place]) 06/29/2012  . Coronary artery disease involving native coronary artery of native heart without angina pectoris   . OSA (obstructive sleep apnea)   . Type 2 diabetes with complication ([place]) 07/62/2633  . GERD without esophagitis 08/04/2010  . History of gout   . Hypertensive heart disease     Past Surgical History:  Procedure Laterality Date  . ANKLE FRACTURE SURGERY Right 1989   "plate put in"  . APPENDECTOMY  05-16-2004   open  . CIRCUMCISION N/A 09/09/2013   Procedure: CIRCUMCISION ADULT;   Surgeon: Bernestine Amass, MD;  Location: [facility];  Service: Urology;  Laterality: N/A;  . COLECTOMY  05-16-2004  . CORONARY ANGIOPLASTY WITH STENT PLACEMENT  06/28/2012  DR COOPER   PCI W/  X1 DES to [place]. LAD/  LM  40% OSTIAL & 50-60% DISTAL /  50% PROX LCX/  30-40% PROX RCA & 50% MID RCA/   LVEF 65-70%  . CORONARY ARTERY BYPASS GRAFT N/A 03/31/2014   Procedure: CORONARY ARTERY BYPASS GRAFTING (CABG) times 3 using left internal mammary artery and right saphenous vein.;  Surgeon: Melrose Nakayama, MD;  Location: [place];  Service: Open Heart Surgery;  Laterality: N/A;  . DOBUTAMINE STRESS ECHO  06-08-2012   MODERATE HYPOKINESIS/ ISCHEMIA MID INFERIOR WALL  . ESOPHAGOGASTRODUODENOSCOPY  08/15/2012   Procedure: ESOPHAGOGASTRODUODENOSCOPY (EGD);  Surgeon: Wonda Horner, MD;  Location: [facility] ENDOSCOPY;  Service: Endoscopy;  Laterality: N/A;  . ESOPHAGOGASTRODUODENOSCOPY N/A 02/17/2014   Procedure: ESOPHAGOGASTRODUODENOSCOPY (EGD);  Surgeon: Jeryl Columbia, MD;  Location: [facility] ENDOSCOPY;  Service: Endoscopy;  Laterality: N/A;  . ESOPHAGOGASTRODUODENOSCOPY (EGD) WITH PROPOFOL N/A 09/30/2017   Procedure: ESOPHAGOGASTRODUODENOSCOPY (EGD) WITH PROPOFOL;  Surgeon: Wonda Horner, MD;  Location: [facility] ENDOSCOPY;  Service: Endoscopy;  Laterality: N/A;  . ESOPHAGOGASTRODUODENOSCOPY (EGD) WITH PROPOFOL N/A 12/20/2017   Procedure: ESOPHAGOGASTRODUODENOSCOPY (EGD) WITH PROPOFOL;  Surgeon: Clarene Essex, MD;  Location: [place];  Service: Endoscopy;  Laterality: N/A;  . HERNIA REPAIR    . INTRAOPERATIVE TRANSESOPHAGEAL ECHOCARDIOGRAM N/A 03/31/2014   Procedure: INTRAOPERATIVE TRANSESOPHAGEAL ECHOCARDIOGRAM;  Surgeon: Melrose Nakayama, MD;  Location: [place];  Service: Open Heart Surgery;  Laterality: N/A;  . LAPAROSCOPIC UMBILICAL HERNIA REPAIR W/ MESH  06-06-2011  . LEFT HEART CATHETERIZATION WITH CORONARY ANGIOGRAM N/A 03/28/2014   Procedure: LEFT HEART CATHETERIZATION WITH CORONARY ANGIOGRAM;  Surgeon:  Sinclair Grooms, MD;  Location: [facility] CATH LAB;  Service: Cardiovascular;  Laterality: N/A;  . LIPOMA EXCISION Left 08/25/2017   Procedure: EXCISION LIPOMA LEFT POSTERIOR THIGH;  Surgeon: Clovis Riley, MD;  Location: [place];  Service: General;  Laterality: Left;  . NEPHROLITHOTOMY  1990'S  . OPEN APPENDECTOMY W/ PARTIAL CECECTOMY  05-16-2004  . PERCUTANEOUS CORONARY STENT INTERVENTION (PCI-S) N/A 06/28/2012   Procedure: PERCUTANEOUS CORONARY STENT INTERVENTION (PCI-S);  Surgeon: Sherren Mocha, MD;  Location: [facility] CATH LAB;  Service: Cardiovascular;  Laterality: N/A;    Current Outpatient Medications on File Prior to Visit  Medication Sig Dispense Refill  . albuterol (VENTOLIN HFA) 108 (90 Base) MCG/ACT inhaler Inhale 2 puffs into the lungs every 6 (six) hours as needed for wheezing or shortness of breath.    Marland Kitchen atorvastatin (LIPITOR) 40 MG tablet Take 1 tablet (40 mg total) by mouth daily at 6 PM. 30 tablet 3  . blood glucose meter kit and supplies Dispense based on patient and insurance preference. Use up to four times daily as directed. (  FOR ICD-10 E10.9, E11.9). 1 each 0  . ferrous sulfate 325 (65 FE) MG EC tablet Take 1 tablet (325 mg total) by mouth daily with breakfast. 90 tablet 3  . gabapentin (NEURONTIN) 100 MG capsule TAKE 1 CAPSULE(100 MG) BY MOUTH TWICE DAILY 180 capsule 3  . glimepiride (AMARYL) 2 MG tablet TAKE 1 TABLET(2 MG) BY MOUTH DAILY BEFORE BREAKFAST 30 tablet 5  . metFORMIN (GLUCOPHAGE) 850 MG tablet Take 1 tablet (850 mg total) by mouth 2 (two) times daily with a meal. 180 tablet 3  . nitroGLYCERIN (NITROSTAT) 0.4 MG SL tablet Place 1 tablet (0.4 mg total) under the tongue every 5 (five) minutes as needed for chest pain. 25 tablet 6  . spironolactone (ALDACTONE) 25 MG tablet Take 1 tablet (25 mg total) by mouth daily. 30 tablet 2  . torsemide (DEMADEX) 20 MG tablet Take 40 mg (2 pills) every morning and every afternoon except Mon, Wed, Fri afternoons take only 20 mg (1  pill) 360 tablet 3  . umeclidinium-vilanterol (ANORO ELLIPTA) 62.5-25 MCG/INH AEPB Inhale 1 puff into the lungs daily. 30 each 11   No current facility-administered medications on file prior to visit.     No Known Allergies  Social History   Occupational History  . Not on file  Tobacco Use  . Smoking status: Former Smoker    Packs/day: 2.00    Years: 40.00    Pack years: 80.00    Types: Cigarettes    Quit date: 08/29/1996    Years since quitting: 23.4  . Smokeless tobacco: Never Used  Vaping Use  . Vaping Use: Never used  Substance and Sexual Activity  . Alcohol use: Yes    Alcohol/week: 8.0 standard drinks    Types: 8 Cans of beer per week    Comment: 2 beers every other day  . Drug use: No  . Sexual activity: Yes    Family History  Problem Relation Age of Onset  . Lung cancer Sister   . Cancer Sister        lung  . Cancer Mother   . Cancer Father        died in his 86s.  . Cancer Brother        lung  . Coronary artery disease Other   . Diabetes Other   . Colon cancer Other   . Cancer Sister        lung    Immunization History  Administered Date(s) Administered  . Influenza Split 08/29/2012, 03/29/2014, 07/08/2015  . Influenza,inj,Quad PF,6+ Mos 09/11/2013  . Pneumococcal Conjugate-13 01/05/2016  . Pneumococcal Polysaccharide-23 09/26/2011  . Td 09/26/2011    Review of systems: Positive Findings in bold print.  Constitutional:  chills, fatigue, fever, sweats, weight change Communication: Optometrist, sign Ecologist, hand writing, iPad/Android device Head: headaches, head injury Eyes: changes in vision, eye pain, glaucoma, cataracts, macular degeneration, diplopia, glare,  light sensitivity, eyeglasses or contacts, blindness Ears nose mouth throat: hearing impaired, hearing aids,  ringing in ears, deaf, sign language,  vertigo, nosebleeds,  rhinitis,  cold sores, snoring, swollen glands Cardiovascular: HTN, edema, arrhythmia, pacemaker in place,  defibrillator in place, chest pain/tightness, chronic anticoagulation, blood clot, heart failure, MI Peripheral Vascular: leg cramps, varicose veins, blood clots, lymphedema, varicosities Respiratory:  difficulty breathing, denies congestion, SOB, wheezing, cough, emphysema Gastrointestinal: change in appetite or weight, abdominal pain, constipation, diarrhea, nausea, vomiting, vomiting blood, change in bowel habits, abdominal pain, jaundice, rectal bleeding, hemorrhoids, GERD Genitourinary:  nocturia,  pain  on urination, polyuria,  blood in urine, Foley catheter, urinary urgency, ESRD on hemodialysis Musculoskeletal: amputation, cramping, stiff joints, painful joints, decreased joint motion, fractures, OA, gout, hemiplegia, paraplegia, uses cane, wheelchair bound, uses walker, uses rollator Skin: +changes in toenails, color change, dryness, itching, mole changes,  rash, wound(s) Neurological: headaches, numbness in feet, paresthesias in feet, burning in feet, fainting,  seizures, change in speech,  headaches, memory problems/poor historian, cerebral palsy, weakness, paralysis, CVA, TIA Endocrine: diabetes, hypothyroidism, hyperthyroidism,  goiter, dry mouth, flushing, heat intolerance,  cold intolerance,  excessive thirst, denies polyuria,  nocturia Hematological:  easy bleeding, excessive bleeding, easy bruising, enlarged lymph nodes, on long term blood thinner, history of past transusions Allergy/immunological:  hives, eczema, frequent infections, multiple drug allergies, seasonal allergies, transplant recipient, multiple food allergies Psychiatric:  anxiety, depression, mood disorder, suicidal ideations, hallucinations, insomnia  Objective: Vitals:   02/18/20 1506  BP: (!) 146/70  Pulse: [place] is a/an 74 y.o. male morbidly obese in NAD.Marland Kitchen AAO X 3.  Vascular Examination: Capillary refill time to digits immediate b/l. Palpable DP pulse(s) b/l lower extremities Nonpalpable PT  pulse(s) b/l lower extremities. Pedal hair sparse. Lower extremity skin temperature gradient within normal limits. No pain with calf compression b/l. Lymphedema present b/l lower extremities. No ischemia or gangrene noted b/l lower extremities.  Dermatological Examination: Pedal skin with normal turgor, texture and tone bilaterally. No open wounds bilaterally. No interdigital macerations bilaterally. Toenails L hallux, L 2nd toe, L 3rd toe and L 5th toe and 1-5 right foot elongated, discolored, dystrophic, thickened, and crumbly with subungual debris and tenderness to dorsal palpation. Hyperkeratotic lesion(s) submet head 5 left foot and submet head 5 right foot.  No erythema, no edema, no drainage, no flocculence.  Evidence of injury to distal tip of left 4th digit. There is dried blood on distal tip of digit. Essentially healed. No erythema, no edema, no drainage, no flocculence.  Evidence of plantar lesion left plantar forefoot with tenderness to palpation. Exploration with light scraping of superficial skin revealed small slither of glass excised. Patient noted relief after removal of glass.  Musculoskeletal Examination: Normal muscle strength 5/5 to all lower extremity muscle groups bilaterally. No pain crepitus or joint limitation noted with ROM b/l. Pes planus deformity noted b/l.   Footwear Assessment: Does the patient wear appropriate shoes? No. Does the patient need inserts/orthotics? Yes.  Neurological Examination: Protective sensation diminished with 10g monofilament b/l. Vibratory sensation decreased b/l. Babinski reflex negative b/l. Clonus negative b/l.  Hemoglobin A1C Latest Ref Rng & Units 11/01/2019 07/29/2019 05/02/2019  HGBA1C 4.8 - 5.6 % 5.0 6.1(H) 9.5(A)  Some recent data might be hidden   Assessment: 1. Pain due to onychomycosis of toenails of both feet   2. Callus   3. Glass foreign body in skin   4. Pes planus of both feet   5. Diabetic peripheral neuropathy  associated with type 2 diabetes mellitus (HCC)     ADA Risk Categorization: High Risk:  Patient has one or more of the following: Loss of protective sensation Absent pedal pulses Severe Foot deformity History of foot ulcer  Plan: -Examined patient. -Diabetic foot examination performed on today's visit. -Discussed diabetic foot care principles. Literature dispensed on today. -Toenails 1-5 right, L hallux, L 2nd toe, L 3rd toe and L 5th toe debrided in length and girth without iatrogenic bleeding with sterile nail nipper and dremel.  -Exploration of foreign body plantar left forefoot revealed small slither of glass  removed. Area irrigated with alcohol. Triple antibiotic ointment and band-aid applied. Prescription written for Mupirocin Ointment. Patient is to apply to left foot once daily for 2 weeks and cover with dressing. Call if he has any problems.  -Callus(es) submet head 5 left foot and submet head 5 right foot pared utilizing sterile scalpel blade without complication or incident. Total number debrided =2. -Patient to report any pedal injuries to medical professional immediately. -Patient to continue soft, supportive shoe gear daily. Start procedure for diabetic shoes. Patient qualifies based on diagnoses. -Patient to continue soft, supportive shoe gear daily. -Patient/POA to call should there be question/concern in the interim.  Return in about 3 months (around 05/20/2020) for diabetic nail and callus trim.

## 2020-02-20 ENCOUNTER — Telehealth: Payer: Self-pay | Admitting: Cardiovascular Disease

## 2020-02-20 NOTE — Telephone Encounter (Signed)
I spoke with [place] ( home health nurse from [place]). She reports patient's weight was 228 lbs yesterday and 234 lbs today.  Was stable until increase today.  Patient ate [place] sub last night. He is not having any shortness of breath.  Is scheduled to take torsemide 40 mg this afternoon.  I gave information to [place] in previous phone note regarding salt intake, compression hose and lounge doctor.  She will give information to patient. Beth reports patient is trying to watch salt intake. He has compression hose but has not been wearing as they are too tight. He is being followed at [facility] and is going to be measured there for new hose. Patient has chronic swelling in legs/ankles.  Leg and abdominal measurements are lower this week than last week but left leg near ankle is more swollen than right.  Left leg is not painful or red.  Patient did have piece of glass removed from left foot earlier this week.  Beth will continue to monitor. I asked Beth to let us know if weight remained elevated or patient developed shortness of breath.

## 2020-02-20 NOTE — Telephone Encounter (Signed)
I spoke with patient's home health nurse and gave her information from Dr Acie Fredrickson.  She will give information to patient.

## 2020-02-20 NOTE — Telephone Encounter (Signed)
Left message to call office

## 2020-02-20 NOTE — Telephone Encounter (Signed)
[phone] RN with [facility] has had a 6 pound weight gain over night. Left leg is bigger than the right .  Both leg measurements a down from last week.

## 2020-02-27 ENCOUNTER — Ambulatory Visit: Payer: Medicare (Managed Care) | Admitting: Orthotics

## 2020-02-27 ENCOUNTER — Other Ambulatory Visit: Payer: Self-pay

## 2020-02-27 DIAGNOSIS — M2142 Flat foot [pes planus] (acquired), left foot: Secondary | ICD-10-CM

## 2020-02-27 DIAGNOSIS — L84 Corns and callosities: Secondary | ICD-10-CM

## 2020-02-27 DIAGNOSIS — E1142 Type 2 diabetes mellitus with diabetic polyneuropathy: Secondary | ICD-10-CM

## 2020-02-27 DIAGNOSIS — M2141 Flat foot [pes planus] (acquired), right foot: Secondary | ICD-10-CM

## 2020-02-27 DIAGNOSIS — M79674 Pain in right toe(s): Secondary | ICD-10-CM

## 2020-02-27 DIAGNOSIS — B351 Tinea unguium: Secondary | ICD-10-CM

## 2020-02-27 NOTE — Progress Notes (Signed)

## 2020-03-11 ENCOUNTER — Other Ambulatory Visit: Payer: Self-pay

## 2020-03-11 ENCOUNTER — Encounter: Payer: Self-pay | Admitting: Cardiovascular Disease

## 2020-03-11 ENCOUNTER — Ambulatory Visit (INDEPENDENT_AMBULATORY_CARE_PROVIDER_SITE_OTHER): Payer: Medicare HMO | Admitting: Cardiovascular Disease

## 2020-03-11 VITALS — BP 128/74 | HR 78 | Ht 68.0 in | Wt 243.8 lb

## 2020-03-11 DIAGNOSIS — I272 Pulmonary hypertension, unspecified: Secondary | ICD-10-CM | POA: Diagnosis not present

## 2020-03-11 DIAGNOSIS — I2510 Atherosclerotic heart disease of native coronary artery without angina pectoris: Secondary | ICD-10-CM

## 2020-03-11 NOTE — Patient Instructions (Signed)
Medication Instructions:  No changes *If you need a refill on your cardiac medications before your next appointment, please call your pharmacy*   Lab Work: none If you have labs (blood work) drawn today and your tests are completely normal, you will receive your results only by: Marland Kitchen MyChart Message (if you have MyChart) OR . A paper copy in the mail If you have any lab test that is abnormal or we need to change your treatment, we will call you to review the results.   Testing/Procedures: none   Follow-Up: At [facility], you and your health needs are our priority.  As part of our continuing mission to provide you with exceptional heart care, we have created designated Provider Care Teams.  These Care Teams include your primary Cardiologist (physician) and Advanced Practice Providers (APPs -  Physician Assistants and Nurse Practitioners) who all work together to provide you with the care you need, when you need it.  We recommend signing up for the patient portal called "MyChart".  Sign up information is provided on this After Visit Summary.  MyChart is used to connect with patients for Virtual Visits (Telemedicine).  Patients are able to view lab/test results, encounter notes, upcoming appointments, etc.  Non-urgent messages can be sent to your provider as well.   To learn more about what you can do with MyChart, go to [email or web address].    Your next appointment:   3 month(s)  The format for your next appointment:   In Person  Provider:   You may see one of the following Advanced Practice Providers on your designated Care Team:    Richardson Dopp, PA-C  Robbie Lis, [place]    Other Instructions Elevate your leg when possible [facility] for [facility] or [facility] in [place]

## 2020-03-11 NOTE — Progress Notes (Signed)
Cardiology Office Note   Date:  03/11/2020   ID:  [name], DOB [birth year], MRN [id number]  PCP:  [name], MD  Cardiologist:   Mertie Moores, MD   No chief complaint on file.      Notes prior to 2014:   [name] is a 74 y.o. male who presents for follow up of his CAD   Expand All Collapse All      [name] Date of Birth1947-12-05   [place] [address], [place], [name], [place], [place] [address] [phone]  Fax [phone]  Problem List: 1. CAD - 3.0 x 28 mm drug-eluting stent (06-28-12), s/p CABG  2. Indigestion 3. Anemia     [name] is a 74 yo with hx of indigestion / chest pain that develops with walking. The pain is a "ache" and lasts as long as he is walking. It causes him some dyspnea. It typically goes away when he stops. He has lots of belching. Hx of asthma. Hx of smoking in the past. No regular exercise. He is now retired. He gets a little exercise every day.  He had a dobutamine echo that revealed mild ischemia in the inferior wall.   He had a cardiac cath which revealed a tight irregular LAD stenosis. He had PTCA and stenting of his LAD by Dr. Burt Knack.  We have advised him to stop eating fried foods. He still is eating fried potatoes on a regular basis.   Jan 14, 2013:  [name] has done well from a cardiac standpoint. He is having some problems with anemia. He was scheduled have a colonoscopy tomorrow but I did not want to stop the Effient and aspirin. The colonoscopy was canceled.  Nov. 17, 2014:  [name] is doing well. He is now a year out from his stenting and can stop the Effient for his  colonoscopy. He does some exercise. He has completely ignored my dietary suggestions from previous visits. Still eats everything that he wants to. Having some abdominal pains.   Oct. 28, 2015:  [name] is doing OK. He's had a hospital admission on July 31 and was found have severe left main disease as well as severe coronary disease. He had coronary artery bypass grafting on August 3. (CABG)X3 LIMA-LAD; SVG-OM; SVG-PD  Still has some chest soreness     Jan. 28, 2016:  [name] is doing well .  No CP . A bit or soreness from shoveling snow.  No angina while shoveling , just chest wall soreness.  The pain is on the left upper chest. Tender to palpitation. Not related to walking.  Has been present for 4-5 days ( since it snowed) not associated with dyspnea , sweats, or dizziness  No dyspnea.   Trying to stick to his diet. Not as successful during the holidays.   February 27, 2015: No angina. Has some chest wall tenderness - worse with coughing . Still leg edema   Oct. 11, 2016:  Had CABG August , 2015 Still having some MSK pain in his chest .  No angina   Staying pretty active.  Is retired.  Does lots of activities around the house and yard.   December 08, 2015:  [name] was seen today for follow up .  Hx of CABG BP and HR are well controlled.  Able to do all of his normal activities  October 09, 2017: [name] is seen back today for follow-up  of his coronary artery disease, hypertension, hyperlipidemia.  He was recently admitted to the hospital for a GI bleed on September 29, 2017. Hb was 5.6 on admission   Has been avoiding salty foods.   March 11, 2020: [name] is seen today for follow up of his CAD, HLD,HTN  No questions Feet are swollen - has been eating more salt than usual   Past Medical History:  Diagnosis Date  . Arthritis    "joints tighten up sometimes" (03/27/2104)  . CHF (congestive heart failure) ([place])   . Chronic lower back pain   . Coronary artery disease     a. 05/2012 Cath/PCI: LM 40ost, 50-60d, LAD 99p ruptured plaque (3.0x28 DES), LCX 50p/m, RCA 30-40p, 42m EF 65-70%.  . Coronary artery disease involving native coronary artery of native heart without angina pectoris    Severe left main disease at catheterization July 2015  CABG x3 with a LIMA to the LAD, SVG to the OM, SVG to the PDA on 03/31/14. EF 60% by cath.   . GERD without esophagitis 08/04/2010  . Hepatic cirrhosis ([place]    a. Dx 01/2014 - CT a/p   . History of blood transfusion    "related to bleeding ulcers"  . History of concussion    1976--  NO RESIDUAL  . History of GI bleed    a. UGIB 07/2012;  b. 01/2014 admission with GIB/FOB stool req 1U prbc's->EGD showed portal gastropathy, barrett's esoph, and chronic active h. pylori gastritis.  .Marland KitchenHistory of gout    2007 &  2008  LEFT LEG-- NO ISSUE SINCE  . Hyperlipidemia   . Iron deficiency anemia   . Kidney stones   . OA (osteoarthritis of spine)    LOWER BACK--  INTERMITTANT LEFT LEG NUMBNESS  . OSA (obstructive sleep apnea)    PULMOLOGIST-  DR CLANCE--  MODERATE OSA  STARTED CPAP 2012--  BUT CURRENTLY HAS NOT USED PAST 6 MONTHS  . Phimosis    a. s/p circumcision 2015.  . Type 2 diabetes mellitus ([place]   . Unspecified essential hypertension     Past Surgical History:  Procedure Laterality Date  . ANKLE FRACTURE SURGERY Right 1989   "plate put in"  . APPENDECTOMY  05-16-2004   open  . CIRCUMCISION N/A 09/09/2013   Procedure: CIRCUMCISION ADULT;  Surgeon: DBernestine Amass MD;  [facility]  Service: Urology;  Laterality: N/A;  . COLECTOMY  05-16-2004  . CORONARY ANGIOPLASTY WITH STENT PLACEMENT  06/28/2012  DR COOPER   PCI W/  X1 DES to PRavine LAD/  LM  40% OSTIAL & 50-60% DISTAL /  50% PROX LCX/  30-40% PROX RCA & 50% MID RCA/   LVEF 65-70%  . CORONARY ARTERY BYPASS GRAFT N/A 03/31/2014   Procedure: CORONARY ARTERY BYPASS GRAFTING (CABG) times 3 using left internal mammary artery and right saphenous vein.;   Surgeon: SMelrose Nakayama MD;  [facility] [facility]  Service: Open Heart Surgery;  Laterality: N/A;  . DOBUTAMINE STRESS ECHO  06-08-2012   MODERATE HYPOKINESIS/ ISCHEMIA MID INFERIOR WALL  . ESOPHAGOGASTRODUODENOSCOPY  08/15/2012   Procedure: ESOPHAGOGASTRODUODENOSCOPY (EGD);  Surgeon: SWonda Horner MD;  [facility]  Service: Endoscopy;  Laterality: N/A;  . ESOPHAGOGASTRODUODENOSCOPY N/A 02/17/2014   Procedure: ESOPHAGOGASTRODUODENOSCOPY (EGD);  Surgeon: MJeryl Columbia MD;  [facility];  Service: Endoscopy;  Laterality: N/A;  . ESOPHAGOGASTRODUODENOSCOPY (EGD) WITH PROPOFOL N/A 09/30/2017   Procedure: ESOPHAGOGASTRODUODENOSCOPY (EGD) WITH PROPOFOL;  Surgeon: GAnson Fret  F, MD;  Location: [facility]OSCOPY;  Service: Endoscopy;  Laterality: N/A;  . ESOPHAGOGASTRODUODENOSCOPY (EGD) WITH PROPOFOL N/A 12/20/2017   Procedure: ESOPHAGOGASTRODUODENOSCOPY (EGD) WITH PROPOFOL;  Surgeon: Clarene Essex, MD;  Location: [place];  Service: Endoscopy;  Laterality: N/A;  . HERNIA REPAIR    . INTRAOPERATIVE TRANSESOPHAGEAL ECHOCARDIOGRAM N/A 03/31/2014   Procedure: INTRAOPERATIVE TRANSESOPHAGEAL ECHOCARDIOGRAM;  Surgeon: Melrose Nakayama, MD;  Location: [place];  Service: Open Heart Surgery;  Laterality: N/A;  . LAPAROSCOPIC UMBILICAL HERNIA REPAIR W/ MESH  06-06-2011  . LEFT HEART CATHETERIZATION WITH CORONARY ANGIOGRAM N/A 03/28/2014   Procedure: LEFT HEART CATHETERIZATION WITH CORONARY ANGIOGRAM;  Surgeon: Sinclair Grooms, MD;  Location[facility] CATH LAB;  Service: Cardiovascular;  Laterality: N/A;  . LIPOMA EXCISION Left 08/25/2017   Procedure: EXCISION LIPOMA LEFT POSTERIOR THIGH;  Surgeon: Clovis Riley, MD;  Location: [place];  Service: General;  Laterality: Left;  . NEPHROLITHOTOMY  1990'S  . OPEN APPENDECTOMY W/ PARTIAL CECECTOMY  05-16-2004  . PERCUTANEOUS CORONARY STENT INTERVENTION (PCI-S) N/A 06/28/2012   Procedure: PERCUTANEOUS CORONARY STENT INTERVENTION (PCI-S);  Surgeon:  Sherren Mocha, MD;  Location[facility] CATH LAB;  Service: Cardiovascular;  Laterality: N/A;     Current Outpatient Medications  Medication Sig Dispense Refill  . albuterol (VENTOLIN HFA) 108 (90 Base) MCG/ACT inhaler Inhale 2 puffs into the lungs every 6 (six) hours as needed for wheezing or shortness of breath.    Marland Kitchen atorvastatin (LIPITOR) 40 MG tablet Take 1 tablet (40 mg total) by mouth daily at 6 PM. 30 tablet 3  . blood glucose meter kit and supplies Dispense based on patient and insurance preference. Use up to four times daily as directed. (FOR ICD-10 E10.9, E11.9). 1 each 0  . ferrous sulfate 325 (65 FE) MG EC tablet Take 1 tablet (325 mg total) by mouth daily with breakfast. 90 tablet 3  . gabapentin (NEURONTIN) 100 MG capsule TAKE 1 CAPSULE(100 MG) BY MOUTH TWICE DAILY 180 capsule 3  . glimepiride (AMARYL) 2 MG tablet TAKE 1 TABLET(2 MG) BY MOUTH DAILY BEFORE BREAKFAST 30 tablet 5  . metFORMIN (GLUCOPHAGE) 850 MG tablet Take 1 tablet (850 mg total) by mouth 2 (two) times daily with a meal. 180 tablet 3  . nitroGLYCERIN (NITROSTAT) 0.4 MG SL tablet Place 1 tablet (0.4 mg total) under the tongue every 5 (five) minutes as needed for chest pain. 25 tablet 6  . spironolactone (ALDACTONE) 25 MG tablet Take 1 tablet (25 mg total) by mouth daily. 30 tablet 2  . torsemide (DEMADEX) 20 MG tablet Take 40 mg (2 pills) every morning and every afternoon except Mon, Wed, Fri afternoons take only 20 mg (1 pill) 360 tablet 3  . umeclidinium-vilanterol (ANORO ELLIPTA) 62.5-25 MCG/INH AEPB Inhale 1 puff into the lungs daily. 30 each 11   No current facility-administered medications for this visit.    Allergies:   Patient has no known allergies.    Social History:  The patient  reports that he quit smoking about 23 years ago. His smoking use included cigarettes. He has a 80.00 pack-year smoking history. He has never used smokeless tobacco. He reports current alcohol use of about 8.0 standard drinks of  alcohol per week. He reports that he does not use drugs.   Family History:  The patient's family history includes Cancer in his brother, father, mother, sister, and sister; Colon cancer in an other family member; Coronary artery disease in an other family member; Diabetes in an other family member;  Lung cancer in his sister.    ROS: As noted in current history, all other systems are negative.  Physical Exam: Blood pressure 128/74, pulse 78, height '5\' 8"'  (1.727 m), weight 243 lb 12.8 oz (110.6 kg), SpO2 97 %.  GEN:   Middle age male,  NAD  HEENT: Normal NECK: No JVD; No carotid bruits LYMPHATICS: No lymphadenopathy CARDIAC: RRR , no murmurs, rubs, gallops RESPIRATORY:  Clear to auscultation without rales, wheezing or rhonchi  ABDOMEN: Soft, non-tender, non-distended MUSCULOSKELETAL:   2-3 + pitting edema .   SKIN: Warm and dry NEUROLOGIC:  Alert and oriented x 3  EKG:     Recent Labs: 12/26/2019: B Natriuretic Peptide 266.2 12/28/2019: TSH 2.761 12/30/2019: Magnesium 2.3 01/03/2020: Hemoglobin 7.2; Platelets 126 01/04/2020: ALT 20 01/28/2020: BUN 31; Creatinine, Ser 1.15; Potassium 4.6; Sodium 140    Lipid Panel    Component Value Date/Time   CHOL 150 11/01/2019 1030   TRIG 69 11/01/2019 1030   HDL 38 (L) 11/01/2019 1030   CHOLHDL 3.9 11/01/2019 1030   VLDL 14 11/01/2019 1030   LDLCALC 98 11/01/2019 1030      Wt Readings from Last 3 Encounters:  03/11/20 243 lb 12.8 oz (110.6 kg)  01/16/20 238 lb 3.2 oz (108 kg)  01/15/20 237 lb 12.8 oz (107.9 kg)     Other studies Reviewed: Additional studies/ records that were reviewed today include: . Review of the above records demonstrates:    ASSESSMENT AND PLAN:  1.  CAD :  No angina .   Cont to follow   2. Essential hypertension:  BP is stable , cont meds.   3. Diabetes mellitus:   4. Obesity:   5. Hyperlipidemia:  - will check labs in 6 months   6. Leg edema :   Echo from Dec. 2020 shows normal LV function .       Moderate PHTN - est PA pressure of 45 He spends the entire day sittng in  [place] with his legs in a dependant position  I recommended that he elevate his legs as much as possible through the day.  He needs to cut out his salty foods.  He is already on a good dose of Torsemide.   I hope not to need to increase his dose of diureti.  We will have him see the APP in 3 months.   7.  GI bleed.  Followed by Dr. Watt Climes.   Hx of hepatic cirrhosis.  He also has some evidence of portal gastropathy and signs of Barrett's esophagus.   Current medicines are reviewed at length with the patient today.  The patient does not have concerns regarding medicines.  The following changes have been made:  no change  Labs/ tests ordered today include:   No orders of the defined types were placed in this encounter.   Disposition:   FU with  Richardson Dopp, PA in 6 months,         Signed, Mertie Moores, MD  03/11/2020 3:00 PM    [place] [place], [place], [place]  [address] Phone: [phone]; Fax: [phone]

## 2020-03-12 ENCOUNTER — Ambulatory Visit (INDEPENDENT_AMBULATORY_CARE_PROVIDER_SITE_OTHER): Payer: Medicare HMO | Admitting: Internal Medicine

## 2020-03-12 ENCOUNTER — Other Ambulatory Visit: Payer: Self-pay | Admitting: Internal Medicine

## 2020-03-12 ENCOUNTER — Encounter: Payer: Self-pay | Admitting: Internal Medicine

## 2020-03-12 ENCOUNTER — Other Ambulatory Visit: Payer: Self-pay

## 2020-03-12 VITALS — BP 122/76 | HR 86 | Temp 98.1°F | Ht 68.0 in | Wt 242.0 lb

## 2020-03-12 DIAGNOSIS — E118 Type 2 diabetes mellitus with unspecified complications: Secondary | ICD-10-CM | POA: Diagnosis not present

## 2020-03-12 LAB — POCT GLYCOSYLATED HEMOGLOBIN (HGB A1C): Hemoglobin A1C: 5.2 % (ref 4.0–5.6)

## 2020-03-12 MED ORDER — BLOOD GLUCOSE METER KIT: PACK | 0 refills | Status: DC

## 2020-03-12 MED ORDER — ALBUTEROL SULFATE HFA 108 (90 BASE) MCG/ACT IN AERS: 2.0000 | INHALATION_SPRAY | Freq: Four times a day (QID) | RESPIRATORY_TRACT | 3 refills | Status: DC | PRN

## 2020-03-12 MED ORDER — ANORO ELLIPTA 62.5-25 MCG/INH IN AEPB: 1.0000 | INHALATION_SPRAY | Freq: Every day | RESPIRATORY_TRACT | 11 refills | Status: DC

## 2020-03-12 NOTE — Progress Notes (Signed)
   Subjective:   Patient ID: [name], male    DOB: 28-Aug-1946, 74 y.o.   MRN: [id number]  HPI The patient is a 74 YO man coming in for diabetes management. Last HgA1c in 5s during hospital stay but had gotten some PRBCs. We had been waiting until this was reasonable to recheck. He is out of strips and cannot check his sugars recently. He would like refill on those. Denies symptoms of low sugars. Denies excessive thirst or urination. Is in process of getting diabetic shoes.Taking metformin and amaryl currently for his diabetes.   Review of Systems  Constitutional: Negative.   HENT: Negative.   Eyes: Negative.   Respiratory: Positive for shortness of breath. Negative for cough and chest tightness.   Cardiovascular: Positive for leg swelling. Negative for chest pain and palpitations.  Gastrointestinal: Negative for abdominal distention, abdominal pain, constipation, diarrhea, nausea and vomiting.  Musculoskeletal: Negative.   Skin: Negative.   Neurological: Negative.   Psychiatric/Behavioral: Negative.     Objective:  Physical Exam Constitutional:      Appearance: He is well-developed. He is obese.  HENT:     Head: Normocephalic and atraumatic.  Cardiovascular:     Rate and Rhythm: Normal rate and regular rhythm.  Pulmonary:     Effort: Pulmonary effort is normal. No respiratory distress.     Breath sounds: Normal breath sounds. No wheezing or rales.  Abdominal:     General: Bowel sounds are normal. There is distension.     Palpations: Abdomen is soft.     Tenderness: There is no abdominal tenderness. There is no rebound.  Musculoskeletal:     Cervical back: Normal range of motion.     Right lower leg: Edema present.     Left lower leg: Edema present.     Comments: Left more than right edema pitting to the thighs stable from prior  Skin:    General: Skin is warm and dry.  Neurological:     Mental Status: He is alert and oriented to person, place, and time.      Coordination: Coordination normal.     Vitals:   03/12/20 1541  BP: 122/76  Pulse: 86  Temp: 98.1 F (36.7 C)  TempSrc: Oral  SpO2: 95%  Weight: 242 lb (109.8 kg)  Height: 5\' 8"  (1.727 m)    This visit occurred during the SARS-CoV-2 public health emergency.  Safety protocols were in place, including screening questions prior to the visit, additional usage of staff PPE, and extensive cleaning of exam room while observing appropriate contact time as indicated for disinfecting solutions.   Assessment & Plan:

## 2020-03-12 NOTE — Assessment & Plan Note (Addendum)
POC HgA1c done today at 5.2. Taking metformin and amaryl previously but we will stop amaryl due to concern for hypoglycemia. Continue metformin. Refilled diabetic meter and supplies today per request. Will fill out form for diabetic shoes and foot exam done today.

## 2020-03-12 NOTE — Patient Instructions (Signed)
We will fill out the forms to get the diabetic shoes.   Make sure to watch the salt to help with the fluid.

## 2020-03-17 ENCOUNTER — Ambulatory Visit: Payer: Medicare (Managed Care) | Admitting: Internal Medicine

## 2020-03-29 DIAGNOSIS — Z7984 Long term (current) use of oral hypoglycemic drugs: Secondary | ICD-10-CM | POA: Diagnosis not present

## 2020-03-29 DIAGNOSIS — R69 Illness, unspecified: Secondary | ICD-10-CM | POA: Diagnosis not present

## 2020-03-29 DIAGNOSIS — I110 Hypertensive heart disease with heart failure: Secondary | ICD-10-CM | POA: Diagnosis not present

## 2020-03-29 DIAGNOSIS — J449 Chronic obstructive pulmonary disease, unspecified: Secondary | ICD-10-CM | POA: Diagnosis not present

## 2020-03-29 DIAGNOSIS — E1142 Type 2 diabetes mellitus with diabetic polyneuropathy: Secondary | ICD-10-CM | POA: Diagnosis not present

## 2020-03-29 DIAGNOSIS — E261 Secondary hyperaldosteronism: Secondary | ICD-10-CM | POA: Diagnosis not present

## 2020-03-29 DIAGNOSIS — E669 Obesity, unspecified: Secondary | ICD-10-CM | POA: Diagnosis not present

## 2020-03-29 DIAGNOSIS — I2510 Atherosclerotic heart disease of native coronary artery without angina pectoris: Secondary | ICD-10-CM | POA: Diagnosis not present

## 2020-03-29 DIAGNOSIS — G3184 Mild cognitive impairment, so stated: Secondary | ICD-10-CM | POA: Diagnosis not present

## 2020-03-29 DIAGNOSIS — I509 Heart failure, unspecified: Secondary | ICD-10-CM | POA: Diagnosis not present

## 2020-04-15 ENCOUNTER — Other Ambulatory Visit: Payer: Self-pay

## 2020-04-15 ENCOUNTER — Ambulatory Visit: Payer: Medicare HMO | Admitting: Orthotics

## 2020-04-15 ENCOUNTER — Emergency Department (HOSPITAL_COMMUNITY): Payer: Medicare HMO

## 2020-04-15 ENCOUNTER — Inpatient Hospital Stay (HOSPITAL_COMMUNITY)
Admission: EM | Admit: 2020-04-15 | Discharge: 2020-04-23 | DRG: 377 | Disposition: A | Discharge status: Home / Self Care | Payer: Medicare HMO | Attending: Internal Medicine | Admitting: Internal Medicine

## 2020-04-15 DIAGNOSIS — E119 Type 2 diabetes mellitus without complications: Secondary | ICD-10-CM | POA: Diagnosis present

## 2020-04-15 DIAGNOSIS — K769 Liver disease, unspecified: Secondary | ICD-10-CM

## 2020-04-15 DIAGNOSIS — F1010 Alcohol abuse, uncomplicated: Secondary | ICD-10-CM | POA: Diagnosis present

## 2020-04-15 DIAGNOSIS — D638 Anemia in other chronic diseases classified elsewhere: Secondary | ICD-10-CM | POA: Diagnosis present

## 2020-04-15 DIAGNOSIS — D649 Anemia, unspecified: Secondary | ICD-10-CM | POA: Diagnosis not present

## 2020-04-15 DIAGNOSIS — K921 Melena: Secondary | ICD-10-CM | POA: Diagnosis not present

## 2020-04-15 DIAGNOSIS — Z87891 Personal history of nicotine dependence: Secondary | ICD-10-CM

## 2020-04-15 DIAGNOSIS — K7581 Nonalcoholic steatohepatitis (NASH): Secondary | ICD-10-CM | POA: Diagnosis present

## 2020-04-15 DIAGNOSIS — Z8711 Personal history of peptic ulcer disease: Secondary | ICD-10-CM

## 2020-04-15 DIAGNOSIS — G4733 Obstructive sleep apnea (adult) (pediatric): Secondary | ICD-10-CM | POA: Diagnosis present

## 2020-04-15 DIAGNOSIS — R932 Abnormal findings on diagnostic imaging of liver and biliary tract: Secondary | ICD-10-CM

## 2020-04-15 DIAGNOSIS — Z79899 Other long term (current) drug therapy: Secondary | ICD-10-CM

## 2020-04-15 DIAGNOSIS — K766 Portal hypertension: Secondary | ICD-10-CM | POA: Diagnosis present

## 2020-04-15 DIAGNOSIS — I110 Hypertensive heart disease with heart failure: Secondary | ICD-10-CM | POA: Diagnosis not present

## 2020-04-15 DIAGNOSIS — K2270 Barrett's esophagus without dysplasia: Secondary | ICD-10-CM | POA: Diagnosis present

## 2020-04-15 DIAGNOSIS — R21 Rash and other nonspecific skin eruption: Secondary | ICD-10-CM | POA: Diagnosis not present

## 2020-04-15 DIAGNOSIS — E669 Obesity, unspecified: Secondary | ICD-10-CM | POA: Diagnosis present

## 2020-04-15 DIAGNOSIS — Z7982 Long term (current) use of aspirin: Secondary | ICD-10-CM

## 2020-04-15 DIAGNOSIS — Z7984 Long term (current) use of oral hypoglycemic drugs: Secondary | ICD-10-CM

## 2020-04-15 DIAGNOSIS — E118 Type 2 diabetes mellitus with unspecified complications: Secondary | ICD-10-CM

## 2020-04-15 DIAGNOSIS — I517 Cardiomegaly: Secondary | ICD-10-CM | POA: Diagnosis not present

## 2020-04-15 DIAGNOSIS — D696 Thrombocytopenia, unspecified: Secondary | ICD-10-CM | POA: Diagnosis present

## 2020-04-15 DIAGNOSIS — K219 Gastro-esophageal reflux disease without esophagitis: Secondary | ICD-10-CM | POA: Diagnosis present

## 2020-04-15 DIAGNOSIS — K7460 Unspecified cirrhosis of liver: Secondary | ICD-10-CM | POA: Diagnosis present

## 2020-04-15 DIAGNOSIS — R160 Hepatomegaly, not elsewhere classified: Secondary | ICD-10-CM | POA: Diagnosis present

## 2020-04-15 DIAGNOSIS — Z87442 Personal history of urinary calculi: Secondary | ICD-10-CM

## 2020-04-15 DIAGNOSIS — Z951 Presence of aortocoronary bypass graft: Secondary | ICD-10-CM

## 2020-04-15 DIAGNOSIS — D509 Iron deficiency anemia, unspecified: Secondary | ICD-10-CM | POA: Diagnosis present

## 2020-04-15 DIAGNOSIS — Z8249 Family history of ischemic heart disease and other diseases of the circulatory system: Secondary | ICD-10-CM

## 2020-04-15 DIAGNOSIS — Z6836 Body mass index (BMI) 36.0-36.9, adult: Secondary | ICD-10-CM

## 2020-04-15 DIAGNOSIS — D61818 Other pancytopenia: Secondary | ICD-10-CM | POA: Diagnosis present

## 2020-04-15 DIAGNOSIS — E785 Hyperlipidemia, unspecified: Secondary | ICD-10-CM | POA: Diagnosis present

## 2020-04-15 DIAGNOSIS — I864 Gastric varices: Secondary | ICD-10-CM | POA: Diagnosis present

## 2020-04-15 DIAGNOSIS — M479 Spondylosis, unspecified: Secondary | ICD-10-CM | POA: Diagnosis present

## 2020-04-15 DIAGNOSIS — Z87820 Personal history of traumatic brain injury: Secondary | ICD-10-CM

## 2020-04-15 DIAGNOSIS — Z955 Presence of coronary angioplasty implant and graft: Secondary | ICD-10-CM

## 2020-04-15 DIAGNOSIS — Z833 Family history of diabetes mellitus: Secondary | ICD-10-CM

## 2020-04-15 DIAGNOSIS — Z20822 Contact with and (suspected) exposure to covid-19: Secondary | ICD-10-CM | POA: Diagnosis present

## 2020-04-15 DIAGNOSIS — I2510 Atherosclerotic heart disease of native coronary artery without angina pectoris: Secondary | ICD-10-CM | POA: Diagnosis present

## 2020-04-15 DIAGNOSIS — J189 Pneumonia, unspecified organism: Secondary | ICD-10-CM | POA: Diagnosis not present

## 2020-04-15 DIAGNOSIS — K3189 Other diseases of stomach and duodenum: Secondary | ICD-10-CM | POA: Diagnosis present

## 2020-04-15 DIAGNOSIS — Z801 Family history of malignant neoplasm of trachea, bronchus and lung: Secondary | ICD-10-CM

## 2020-04-15 DIAGNOSIS — I509 Heart failure, unspecified: Secondary | ICD-10-CM

## 2020-04-15 DIAGNOSIS — R0602 Shortness of breath: Secondary | ICD-10-CM | POA: Diagnosis not present

## 2020-04-15 DIAGNOSIS — I5033 Acute on chronic diastolic (congestive) heart failure: Secondary | ICD-10-CM | POA: Diagnosis present

## 2020-04-15 DIAGNOSIS — R0902 Hypoxemia: Secondary | ICD-10-CM | POA: Diagnosis present

## 2020-04-15 DIAGNOSIS — Z800 Family history of malignant neoplasm of digestive organs: Secondary | ICD-10-CM

## 2020-04-15 DIAGNOSIS — R188 Other ascites: Secondary | ICD-10-CM | POA: Diagnosis present

## 2020-04-15 LAB — CBC
HCT: 21.8 % — ABNORMAL LOW (ref 39.0–52.0)
Hemoglobin: 5.9 g/dL — CL (ref 13.0–17.0)
MCH: 21.2 pg — ABNORMAL LOW (ref 26.0–34.0)
MCHC: 27.1 g/dL — ABNORMAL LOW (ref 30.0–36.0)
MCV: 78.4 fL — ABNORMAL LOW (ref 80.0–100.0)
Platelets: 137 10*3/uL — ABNORMAL LOW (ref 150–400)
RBC: 2.78 MIL/uL — ABNORMAL LOW (ref 4.22–5.81)
RDW: 18.6 % — ABNORMAL HIGH (ref 11.5–15.5)
WBC: 4.1 10*3/uL (ref 4.0–10.5)
nRBC: 0 % (ref 0.0–0.2)

## 2020-04-15 LAB — BRAIN NATRIURETIC PEPTIDE: B Natriuretic Peptide: 293 pg/mL — ABNORMAL HIGH (ref 0.0–100.0)

## 2020-04-15 LAB — VITAMIN B12: Vitamin B-12: 235 pg/mL (ref 180–914)

## 2020-04-15 LAB — IRON AND TIBC
Iron: 12 ug/dL — ABNORMAL LOW (ref 45–182)
Saturation Ratios: 2 % — ABNORMAL LOW (ref 17.9–39.5)
TIBC: 554 ug/dL — ABNORMAL HIGH (ref 250–450)
UIBC: 542 ug/dL

## 2020-04-15 LAB — BASIC METABOLIC PANEL
Anion gap: 11 (ref 5–15)
BUN: 27 mg/dL — ABNORMAL HIGH (ref 8–23)
CO2: 25 mmol/L (ref 22–32)
Calcium: 8.3 mg/dL — ABNORMAL LOW (ref 8.9–10.3)
Chloride: 100 mmol/L (ref 98–111)
Creatinine, Ser: 1.18 mg/dL (ref 0.61–1.24)
GFR calc Af Amer: >60 mL/min (ref >60)
GFR calc non Af Amer: >60 mL/min (ref >60)
Glucose, Bld: 178 mg/dL — ABNORMAL HIGH (ref 70–99)
Potassium: 3.6 mmol/L (ref 3.5–5.1)
Sodium: 136 mmol/L (ref 135–145)

## 2020-04-15 LAB — HEPATIC FUNCTION PANEL
ALT: 17 U/L (ref 0–44)
AST: 23 U/L (ref 15–41)
Albumin: 3.6 g/dL (ref 3.5–5.0)
Alkaline Phosphatase: 94 U/L (ref 38–126)
Bilirubin, Direct: 0.1 mg/dL (ref 0.0–0.2)
Indirect Bilirubin: 0.9 mg/dL (ref 0.3–0.9)
Total Bilirubin: 1 mg/dL (ref 0.3–1.2)
Total Protein: 7.1 g/dL (ref 6.5–8.1)

## 2020-04-15 LAB — POC OCCULT BLOOD, ED: Fecal Occult Bld: POSITIVE — AB

## 2020-04-15 LAB — PROTIME-INR
INR: 1.1 (ref 0.8–1.2)
Prothrombin Time: 14.2 seconds (ref 11.4–15.2)

## 2020-04-15 LAB — SARS CORONAVIRUS 2 BY RT PCR (HOSPITAL ORDER, PERFORMED IN ~~LOC~~ HOSPITAL LAB): SARS Coronavirus 2: NEGATIVE

## 2020-04-15 LAB — FERRITIN: Ferritin: 10 ng/mL — ABNORMAL LOW (ref 24–336)

## 2020-04-15 LAB — TROPONIN I (HIGH SENSITIVITY): Troponin I (High Sensitivity): 10 ng/L (ref <18)

## 2020-04-15 LAB — PREPARE RBC (CROSSMATCH)

## 2020-04-15 LAB — FOLATE: Folate: 22 ng/mL (ref >5.9)

## 2020-04-15 MED ORDER — UMECLIDINIUM-VILANTEROL 62.5-25 MCG/INH IN AEPB
1.0000 | INHALATION_SPRAY | Freq: Every day | RESPIRATORY_TRACT | Status: DC
  Administered 2020-04-16 – 2020-04-23 (×8): 1 via RESPIRATORY_TRACT
  Filled 2020-04-15 (×3): qty 14

## 2020-04-15 MED ORDER — SODIUM CHLORIDE 0.9% FLUSH: 3.0000 mL | INTRAVENOUS | Status: DC | PRN

## 2020-04-15 MED ORDER — SODIUM CHLORIDE 0.9% IV SOLUTION
Freq: Once | INTRAVENOUS | Status: AC
  Administered 2020-04-15: 50 mL via INTRAVENOUS

## 2020-04-15 MED ORDER — ALBUTEROL SULFATE (2.5 MG/3ML) 0.083% IN NEBU
2.5000 mg | INHALATION_SOLUTION | Freq: Four times a day (QID) | RESPIRATORY_TRACT | Status: DC | PRN
  Administered 2020-04-19: 2.5 mg via RESPIRATORY_TRACT
  Filled 2020-04-15: qty 3

## 2020-04-15 MED ORDER — SODIUM CHLORIDE 0.9 % IV SOLN
250.0000 mL | INTRAVENOUS | Status: DC | PRN
  Administered 2020-04-17: 250 mL via INTRAVENOUS

## 2020-04-15 MED ORDER — PANTOPRAZOLE SODIUM 40 MG IV SOLR
40.0000 mg | Freq: Two times a day (BID) | INTRAVENOUS | Status: DC
  Administered 2020-04-16 – 2020-04-17 (×4): 40 mg via INTRAVENOUS
  Filled 2020-04-15 (×4): qty 40

## 2020-04-15 MED ORDER — FUROSEMIDE 10 MG/ML IJ SOLN
40.0000 mg | Freq: Two times a day (BID) | INTRAMUSCULAR | Status: DC
  Administered 2020-04-16 – 2020-04-18 (×5): 40 mg via INTRAVENOUS
  Filled 2020-04-15 (×5): qty 4

## 2020-04-15 MED ORDER — GABAPENTIN 100 MG PO CAPS
100.0000 mg | ORAL_CAPSULE | Freq: Two times a day (BID) | ORAL | Status: DC
  Administered 2020-04-16 – 2020-04-23 (×15): 100 mg via ORAL
  Filled 2020-04-15 (×16): qty 1

## 2020-04-15 MED ORDER — INSULIN ASPART 100 UNIT/ML ~~LOC~~ SOLN
0.0000 [IU] | SUBCUTANEOUS | Status: DC
  Administered 2020-04-16 (×2): 1 [IU] via SUBCUTANEOUS
  Administered 2020-04-17: 2 [IU] via SUBCUTANEOUS
  Administered 2020-04-20: 1 [IU] via SUBCUTANEOUS

## 2020-04-15 MED ORDER — FUROSEMIDE 10 MG/ML IJ SOLN
40.0000 mg | Freq: Once | INTRAMUSCULAR | Status: AC
  Administered 2020-04-15: 40 mg via INTRAVENOUS
  Filled 2020-04-15: qty 4

## 2020-04-15 MED ORDER — SPIRONOLACTONE 25 MG PO TABS
25.0000 mg | ORAL_TABLET | Freq: Every day | ORAL | Status: DC
  Administered 2020-04-16 – 2020-04-18 (×3): 25 mg via ORAL
  Filled 2020-04-15 (×3): qty 1

## 2020-04-15 MED ORDER — SODIUM CHLORIDE 0.9% FLUSH
3.0000 mL | Freq: Two times a day (BID) | INTRAVENOUS | Status: DC
  Administered 2020-04-16: 10 mL via INTRAVENOUS
  Administered 2020-04-17 – 2020-04-22 (×11): 3 mL via INTRAVENOUS

## 2020-04-15 MED ORDER — ATORVASTATIN CALCIUM 40 MG PO TABS
40.0000 mg | ORAL_TABLET | Freq: Every day | ORAL | Status: DC
  Administered 2020-04-16 – 2020-04-22 (×7): 40 mg via ORAL
  Filled 2020-04-15 (×7): qty 1

## 2020-04-15 MED ORDER — ACETAMINOPHEN 325 MG PO TABS: 650.0000 mg | ORAL_TABLET | ORAL | Status: DC | PRN

## 2020-04-15 MED ORDER — ONDANSETRON HCL 4 MG/2ML IJ SOLN: 4.0000 mg | Freq: Four times a day (QID) | INTRAMUSCULAR | Status: DC | PRN

## 2020-04-15 NOTE — H&P (Signed)
History and Physical    [name] YIR:[id number] DOB: 11/08/1945 DOA: 04/15/2020  PCP: [name], MD  Patient coming from: Home  I have personally briefly reviewed patient's old medical records in [place]  Chief Complaint: Dyspnea, leg swelling  HPI: [name] is a 75 y.o. male with medical history significant for CAD s/p CABG, HFpEF (EF 55-60% by TTE 07/2019), T2DM, HTN, HLD, hepatic cirrhosis with portal gastropathy, history of GI bleed with previous gastric angiectasias, iron deficiency anemia, GERD/Barrett's esophagus, and OSA intolerant to CPAP who presents to the ED for evaluation of dyspnea and lower extremity swelling.  Patient reports about 1 week of worsening swelling in both of his lower extremities as well as dyspnea with minimal exertion.  He has had associated cough productive of clear sputum.  He sometimes awakens at night due to shortness of breath.  He reports adherence to his medications and good urine output.  He has seen occasional black-colored stools but not in the last couple days.  He denies any lightheadedness, dizziness, chest pain, abdominal pain.  He is not had any hemoptysis, hematemesis, hematuria, or BRBPR.  He denies any NSAID use.  ED Course:  Initial vitals showed BP 142/53, pulse 78, RR 16, temp 99.0 Fahrenheit, SPO2 95% on room air.  Labs notable for hemoglobin 5.9 (recent baseline 7.2-7.5), WBC 4.1, MCV 78.4, hematocrit 21.8, platelets 137,000, RDW 18.6, sodium 136, potassium 3.6, bicarb 25, BUN 27, creatinine 1.18, serum glucose 178, BNP 293, LFTs within normal limits, high-sensitivity troponin I 10, PT 14.2, INR 1.1.  FOBT is positive. SARS-CoV-2 PCR is collected and pending.  2 view chest x-ray shows prior CABG changes, mild cardiomegaly and vascular congestion.  No focal consolidation seen.  Patient was given IV Lasix 40 mg once and ordered to receive transfusion 1 unit PRBC.  The hospitalist service was consulted to admit  for further evaluation and management.  Review of Systems: All systems reviewed and are negative except as documented in history of present illness above.   Past Medical History:  Diagnosis Date   Arthritis    "joints tighten up sometimes" (03/27/2104)   CHF (congestive heart failure) (HCC)    Chronic lower back pain    Coronary artery disease    a. 05/2012 Cath/PCI: LM 40ost, 50-60d, LAD 99p ruptured plaque (3.0x28 DES), LCX 50p/m, RCA 30-40p, 51m EF 65-70%.   Coronary artery disease involving native coronary artery of native heart without angina pectoris    Severe left main disease at catheterization July 2015  CABG x3 with a LIMA to the LAD, SVG to the OM, SVG to the PDA on 03/31/14. EF 60% by cath.    GERD without esophagitis 08/04/2010   Hepatic cirrhosis ([place]    a. Dx 01/2014 - CT a/p    History of blood transfusion    "related to bleeding ulcers"   History of concussion    1976--  NO RESIDUAL   History of GI bleed    a. UGIB 07/2012;  b. 01/2014 admission with GIB/FOB stool req 1U prbc's->EGD showed portal gastropathy, barrett's esoph, and chronic active h. pylori gastritis.   History of gout    2007 &  2008  LEFT LEG-- NO ISSUE SINCE   Hyperlipidemia    Iron deficiency anemia    Kidney stones    OA (osteoarthritis of spine)    LOWER BACK--  INTERMITTANT LEFT LEG NUMBNESS   OSA (obstructive sleep apnea)    PULMOLOGIST-  DR CLANCE--  MODERATE OSA  STARTED CPAP 2012--  BUT CURRENTLY HAS NOT USED PAST 6 MONTHS   Phimosis    a. s/p circumcision 2015.   Type 2 diabetes mellitus ([place])    Unspecified essential hypertension     Past Surgical History:  Procedure Laterality Date   ANKLE FRACTURE SURGERY Right 1989   "plate put in"   APPENDECTOMY  05-16-2004   open   CIRCUMCISION N/A 09/09/2013   Procedure: CIRCUMCISION ADULT;  Surgeon: Bernestine Amass, MD;  Location: [facility];  Service: Urology;  Laterality: N/A;   COLECTOMY  05-16-2004     CORONARY ANGIOPLASTY WITH STENT PLACEMENT  06/28/2012  DR COOPER   PCI W/  X1 DES to [place]. LAD/  LM  40% OSTIAL & 50-60% DISTAL /  50% PROX LCX/  30-40% PROX RCA & 50% MID RCA/   LVEF 65-70%   CORONARY ARTERY BYPASS GRAFT N/A 03/31/2014   Procedure: CORONARY ARTERY BYPASS GRAFTING (CABG) times 3 using left internal mammary artery and right saphenous vein.;  Surgeon: Melrose Nakayama, MD;  Location: [place];  Service: Open Heart Surgery;  Laterality: N/A;   DOBUTAMINE STRESS ECHO  06-08-2012   MODERATE HYPOKINESIS/ ISCHEMIA MID INFERIOR WALL   ESOPHAGOGASTRODUODENOSCOPY  08/15/2012   Procedure: ESOPHAGOGASTRODUODENOSCOPY (EGD);  Surgeon: Wonda Horner, MD;  Location: [name] ENDOSCOPY;  Service: Endoscopy;  Laterality: N/A;   ESOPHAGOGASTRODUODENOSCOPY N/A 02/17/2014   Procedure: ESOPHAGOGASTRODUODENOSCOPY (EGD);  Surgeon: Jeryl Columbia, MD;  Location[facility] ENDOSCOPY;  Service: Endoscopy;  Laterality: N/A;   ESOPHAGOGASTRODUODENOSCOPY (EGD) WITH PROPOFOL N/A 09/30/2017   Procedure: ESOPHAGOGASTRODUODENOSCOPY (EGD) WITH PROPOFOL;  Surgeon: Wonda Horner, MD;  Location: [facility] ENDOSCOPY;  Service: Endoscopy;  Laterality: N/A;   ESOPHAGOGASTRODUODENOSCOPY (EGD) WITH PROPOFOL N/A 12/20/2017   Procedure: ESOPHAGOGASTRODUODENOSCOPY (EGD) WITH PROPOFOL;  Surgeon: Clarene Essex, MD;  Location: [place];  Service: Endoscopy;  Laterality: N/A;   HERNIA REPAIR     INTRAOPERATIVE TRANSESOPHAGEAL ECHOCARDIOGRAM N/A 03/31/2014   Procedure: INTRAOPERATIVE TRANSESOPHAGEAL ECHOCARDIOGRAM;  Surgeon: Melrose Nakayama, MD;  Location: [place];  Service: Open Heart Surgery;  Laterality: N/A;   LAPAROSCOPIC UMBILICAL HERNIA REPAIR W/ MESH  06-06-2011   LEFT HEART CATHETERIZATION WITH CORONARY ANGIOGRAM N/A 03/28/2014   Procedure: LEFT HEART CATHETERIZATION WITH CORONARY ANGIOGRAM;  Surgeon: Sinclair Grooms, MD;  Location: [facility] CATH LAB;  Service: Cardiovascular;  Laterality: N/A;   LIPOMA EXCISION Left 08/25/2017    Procedure: EXCISION LIPOMA LEFT POSTERIOR THIGH;  Surgeon: Clovis Riley, MD;  Location: [place];  Service: General;  Laterality: Left;   NEPHROLITHOTOMY  1990'S   OPEN APPENDECTOMY W/ PARTIAL CECECTOMY  05-16-2004   PERCUTANEOUS CORONARY STENT INTERVENTION (PCI-S) N/A 06/28/2012   Procedure: PERCUTANEOUS CORONARY STENT INTERVENTION (PCI-S);  Surgeon: Sherren Mocha, MD;  Location: [facility] CATH LAB;  Service: Cardiovascular;  Laterality: N/A;    Social History:  reports that he quit smoking about 23 years ago. His smoking use included cigarettes. He has a 80.00 pack-year smoking history. He has never used smokeless tobacco. He reports current alcohol use of about 8.0 standard drinks of alcohol per week. He reports that he does not use drugs.  No Known Allergies  Family History  Problem Relation Age of Onset   Lung cancer Sister    Cancer Sister        lung   Cancer Mother    Cancer Father        died in his 35s.   Cancer Brother  lung   Coronary artery disease Other    Diabetes Other    Colon cancer Other    Cancer Sister        lung     Prior to Admission medications   Medication Sig Start Date End Date Taking? Authorizing Provider  Accu-Chek Softclix Lancets lancets USE AS DIRECTED TO TEST BLOOD SUGAR FOUR TIMES DAILY Patient taking differently: 1 each by Other route in the morning, at noon, in the evening, and at bedtime.  03/13/20   [name], MD  albuterol (VENTOLIN HFA) 108 (90 Base) MCG/ACT inhaler Inhale 2 puffs into the lungs every 6 (six) hours as needed for wheezing or shortness of breath. 03/12/20   [name], MD  atorvastatin (LIPITOR) 40 MG tablet Take 1 tablet (40 mg total) by mouth daily at 6 PM. 01/04/20   Nita Sells, MD  blood glucose meter kit and supplies Dispense based on patient and insurance preference. Use up to four times daily as directed. (FOR ICD-10 E10.9, E11.9). Patient taking differently: 1 each by Other  route See admin instructions. Dispense based on patient and insurance preference. Use up to four times daily as directed. (FOR ICD-10 E10.9, E11.9). 03/12/20   [name], MD  ferrous sulfate 325 (65 FE) MG EC tablet Take 1 tablet (325 mg total) by mouth daily with breakfast. 01/16/20   [name], MD  gabapentin (NEURONTIN) 100 MG capsule TAKE 1 CAPSULE(100 MG) BY MOUTH TWICE DAILY Patient taking differently: Take 100 mg by mouth 2 (two) times daily. TAKE 1 CAPSULE(100 MG) BY MOUTH TWICE DAILY 05/14/19   [name], MD  glimepiride (AMARYL) 2 MG tablet Take 2 mg by mouth daily. 03/29/20   [provider]  metFORMIN (GLUCOPHAGE) 850 MG tablet Take 1 tablet (850 mg total) by mouth 2 (two) times daily with a meal. 05/30/19   [name], MD  nitroGLYCERIN (NITROSTAT) 0.4 MG SL tablet Place 1 tablet (0.4 mg total) under the tongue every 5 (five) minutes as needed for chest pain. 06/08/16   Nahser, Wonda Cheng, MD  spironolactone (ALDACTONE) 25 MG tablet Take 1 tablet (25 mg total) by mouth daily. 01/04/20   Nita Sells, MD  torsemide (DEMADEX) 20 MG tablet Take 40 mg (2 pills) every morning and every afternoon except Mon, Wed, Fri afternoons take only 20 mg (1 pill) 02/06/20   Nahser, Wonda Cheng, MD  umeclidinium-vilanterol (ANORO ELLIPTA) 62.5-25 MCG/INH AEPB Inhale 1 puff into the lungs daily. 03/12/20   [name], MD    Physical Exam: Vitals:   04/15/20 2135 04/15/20 2145 04/15/20 2154 04/15/20 2215  BP: (!) 145/60 (!) 142/76 (!) 146/57 (!) 154/73  Pulse: 64 71 69 82  Resp: 18 (!) 21 20 (!) 23  Temp: 98.2 F (36.8 C)  98.3 F (36.8 C)   TempSrc: Oral  Oral   SpO2: 99% 98% 98% 94%   Constitutional: Obese man resting in bed with head elevated, NAD, calm, comfortable Eyes: PERRL, lids and conjunctivae normal ENMT: Mucous membranes are moist. Posterior pharynx clear of any exudate or lesions.  Neck: normal, supple, no  masses. Respiratory: Bibasilar inspiratory crackles. Normal respiratory effort. No accessory muscle use.  Cardiovascular: Regular rate and rhythm, no murmurs / rubs / gallops.  +1 bilateral lower extremity edema. Abdomen: Distended but not tense obese abdomen, no tenderness, no masses palpated. Bowel sounds positive.  Musculoskeletal: no clubbing / cyanosis. No joint deformity upper and lower extremities. Good ROM, no  contractures. Normal muscle tone.  Skin: Stasis dermatitis changes both lower extremities.  No ulcers. No induration Neurologic: CN 2-12 grossly intact. Sensation intact, Strength 5/5 in all 4.  Psychiatric: Normal judgment and insight. Alert and oriented x 3. Normal mood.     Labs on Admission: I have personally reviewed following labs and imaging studies  CBC: Recent Labs  Lab 04/15/20 1538  WBC 4.1  HGB 5.9*  HCT 21.8*  MCV 78.4*  PLT 308*   Basic Metabolic Panel: Recent Labs  Lab 04/15/20 1538  NA 136  K 3.6  CL 100  CO2 25  GLUCOSE 178*  BUN 27*  CREATININE 1.18  CALCIUM 8.3*   GFR: CrCl cannot be calculated (Unknown ideal weight.). Liver Function Tests: Recent Labs  Lab 04/15/20 2102  AST 23  ALT 17  ALKPHOS 94  BILITOT 1.0  PROT 7.1  ALBUMIN 3.6   No results for input(s): LIPASE, AMYLASE in the last 168 hours. No results for input(s): AMMONIA in the last 168 hours. Coagulation Profile: Recent Labs  Lab 04/15/20 2102  INR 1.1   Cardiac Enzymes: No results for input(s): CKTOTAL, CKMB, CKMBINDEX, TROPONINI in the last 168 hours. BNP (last 3 results) No results for input(s): PROBNP in the last 8760 hours. HbA1C: No results for input(s): HGBA1C in the last 72 hours. CBG: No results for input(s): GLUCAP in the last 168 hours. Lipid Profile: No results for input(s): CHOL, HDL, LDLCALC, TRIG, CHOLHDL, LDLDIRECT in the last 72 hours. Thyroid Function Tests: No results for input(s): TSH, T4TOTAL, FREET4, T3FREE, THYROIDAB in the last 72  hours. Anemia Panel: No results for input(s): VITAMINB12, FOLATE, FERRITIN, TIBC, IRON, RETICCTPCT in the last 72 hours. Urine analysis:    Component Value Date/Time   COLORURINE YELLOW 07/29/2019 1849   APPEARANCEUR CLEAR 07/29/2019 1849   LABSPEC 1.010 07/29/2019 1849   PHURINE 6.0 07/29/2019 1849   GLUCOSEU NEGATIVE 07/29/2019 1849   HGBUR NEGATIVE 07/29/2019 1849   BILIRUBINUR NEGATIVE 07/29/2019 [place] 07/29/2019 1849   PROTEINUR NEGATIVE 07/29/2019 1849   UROBILINOGEN 0.2 03/29/2014 1546   NITRITE NEGATIVE 07/29/2019 1849   LEUKOCYTESUR NEGATIVE 07/29/2019 1849    Radiological Exams on Admission: DG Chest 2 View  Result Date: 04/15/2020 CLINICAL DATA:  74 year old male with shortness of breath. EXAM: CHEST - 2 VIEW COMPARISON:  Chest radiograph dated 12/26/2019. FINDINGS: There is mild cardiomegaly and mild vascular congestion and edema. Pneumonia is not excluded clinical correlation is recommended. No focal consolidation, pleural effusion, pneumothorax. Median sternotomy wires and CABG vascular clips. Atherosclerotic calcification of the aorta. No acute osseous pathology. IMPRESSION: Mild cardiomegaly with mild vascular congestion and edema. No focal consolidation. Electronically Signed   By: Anner Crete M.D.   On: 04/15/2020 16:28    EKG: Independently reviewed. Normal sinus rhythm, incomplete RBBB.  When compared to prior, prolonged PR interval has resolved.  Assessment/Plan Principal Problem:   Symptomatic anemia Active Problems:   Type 2 diabetes with complication (HCC)   OSA (obstructive sleep apnea)   Coronary artery disease involving native coronary artery of native heart without angina pectoris   Hyperlipidemia with target LDL less than 70   Hepatic cirrhosis (HCC)   Acute on chronic diastolic CHF (congestive heart failure) (HCC)  [name] is a 74 y.o. male with medical history significant for CAD s/p CABG, HFpEF (EF 55-60% by TTE  07/2019), T2DM, HTN, HLD, hepatic cirrhosis with portal gastropathy, history of GI bleed with previous gastric angiectasias, iron  deficiency anemia, GERD/Barrett's esophagus, and OSA intolerant to CPAP who is admitted with symptomatic anemia and acute on chronic CHF exacerbation.  Symptomatic anemia History of GI bleed Anemia of chronic disease and iron deficiency: Hemoglobin 5.9 on admission, FOBT positive. Last EGD 12/20/2017 by Dr. Watt Climes showed 5 nonbleeding angiectasia's in the stomach treated with APCs. -Transfusing 1 unit PRBC -Repeat CBC in a.m. -Start IV Protonix 40 mg twice daily -Keep n.p.o. after midnight -Epic secure chat message sent to on-call [facility] for a.m. consultation  Acute on chronic diastolic CHF: Last EF 31-49% by TTE 07/2019.  He is volume overloaded on admission. -Continue IV Lasix 40 mg twice daily -Continue spironolactone -Monitor strict I/O's and daily weights  CAD s/p CABG: Denies any chest pain.  Continue atorvastatin.  No longer on aspirin due to bleeding risk.  Type II diabetes: Hold home Metformin and Amaryl while in hospital.  Start sensitive SSI while NPO.  Hypertension: Continue spironolactone.  Hepatic cirrhosis with portal gastropathy: Appears compensated.  Continue monitor.  Hyperlipidemia: Continue atorvastatin.  OSA: Not currently using CPAP due to intolerance.  DVT prophylaxis: SCDs Code Status: Full code, confirmed with patient Family Communication: Discussed with patient, he has discussed with family Disposition Plan: From home and likely discharge to home pending further anemia evaluation and HFpEF management Consults called: Epic secure chat sent to on-call [facility] for a.m. consultation Admission status:  Status is: Observation  The patient remains OBS appropriate and will d/c before 2 midnights.  Dispo: The patient is from: Home              Anticipated d/c is to: Home              Anticipated d/c date is: 1 day               Patient currently is not medically stable to d/c.  Zada Finders MD [facility]  If 7PM-7AM, please contact night-coverage [email or web address]  04/15/2020, 10:34 PM

## 2020-04-15 NOTE — ED Notes (Signed)
RN obtained pt's informed consent for blood transfusion

## 2020-04-15 NOTE — ED Provider Notes (Signed)
[place] EMERGENCY DEPARTMENT Provider Note   CSN: [id number] Arrival date & time: 04/15/20  1503     History Chief Complaint  Patient presents with  . Shortness of Breath  . Leg Swelling    [name] is a 74 y.o. male.  The history is provided by the patient and medical records. No language interpreter was used.  Shortness of Breath  [name] is a 74 y.o. male who presents to the Emergency Department complaining of leg swelling and sob.  He has one week of BLE edema and two days of sob.  He has dyspnea on minimal exertion.  Has cough productive of white phlegm.     Denies chest pain but has pain in his left shoulder.  Denies fever, vomiting, abdominal pain, diarrhea.  Does not weigh himself regularly.  No black/bloody stools.  No known covid 19 exposures.  Has not been vaccinated for COVID 19. He is compliant with his home medications but is unsure what they are as his son gives him his medications.    Past Medical History:  Diagnosis Date  . Arthritis    "joints tighten up sometimes" (03/27/2104)  . CHF (congestive heart failure) ([place])   . Chronic lower back pain   . Coronary artery disease    a. 05/2012 Cath/PCI: LM 40ost, 50-60d, LAD 99p ruptured plaque (3.0x28 DES), LCX 50p/m, RCA 30-40p, 45m EF 65-70%.  . Coronary artery disease involving native coronary artery of native heart without angina pectoris    Severe left main disease at catheterization July 2015  CABG x3 with a LIMA to the LAD, SVG to the OM, SVG to the PDA on 03/31/14. EF 60% by cath.   . GERD without esophagitis 08/04/2010  . Hepatic cirrhosis ([place]    a. Dx 01/2014 - CT a/p   . History of blood transfusion    "related to bleeding ulcers"  . History of concussion    1976--  NO RESIDUAL  . History of GI bleed    a. UGIB 07/2012;  b. 01/2014 admission with GIB/FOB stool req 1U prbc's->EGD showed portal gastropathy, barrett's esoph, and chronic active h. pylori gastritis.  .Marland KitchenHistory  of gout    2007 &  2008  LEFT LEG-- NO ISSUE SINCE  . Hyperlipidemia   . Iron deficiency anemia   . Kidney stones   . OA (osteoarthritis of spine)    LOWER BACK--  INTERMITTANT LEFT LEG NUMBNESS  . OSA (obstructive sleep apnea)    PULMOLOGIST-  DR CLANCE--  MODERATE OSA  STARTED CPAP 2012--  BUT CURRENTLY HAS NOT USED PAST 6 MONTHS  . Phimosis    a. s/p circumcision 2015.  . Type 2 diabetes mellitus ([place]   . Unspecified essential hypertension     Patient Active Problem List   Diagnosis Date Noted  . Symptomatic anemia 04/15/2020  . Pulmonary hypertension, unspecified ([place] 03/11/2020  . Asthma, mild intermittent 12/27/2019  . Acute on chronic diastolic (congestive) heart failure ([place] 12/27/2019  . Alcoholic cirrhosis of liver with ascites ([place] 12/27/2019  . Acute exacerbation of CHF (congestive heart failure) ([place] 12/27/2019  . Acute on chronic diastolic CHF (congestive heart failure) ([place] 11/01/2019  . Volume overload 07/29/2019  . Itching 03/22/2019  . Skin lesion 03/22/2019  . Hyponatremia 12/24/2018  . (HFpEF) heart failure with preserved ejection fraction ([place] 12/24/2018  . Precordial chest pain   . Neck pain 09/12/2018  . CKD (chronic kidney disease), stage III 12/18/2017  .  Acute on chronic anemia 12/18/2017  . Peptic ulcer disease 12/18/2017  . GI bleed 09/29/2017  . Shortness of breath 09/21/2017  . Morbid obesity ([place]) 02/05/2016  . Numerous moles 09/15/2015  . S/P CABG x 3 03/31/2014  . Hepatic cirrhosis ([place]) 02/27/2014  . Acute blood loss anemia 08/13/2012  . Hyperlipidemia with target LDL less than 70 06/29/2012  . Thrombocytopenia ([place]) 06/29/2012  . Coronary artery disease involving native coronary artery of native heart without angina pectoris   . OSA (obstructive sleep apnea)   . Type 2 diabetes with complication ([place]) 83/38/2505  . GERD without esophagitis 08/04/2010  . History of gout   . Hypertensive heart disease     Past Surgical  History:  Procedure Laterality Date  . ANKLE FRACTURE SURGERY Right 1989   "plate put in"  . APPENDECTOMY  05-16-2004   open  . CIRCUMCISION N/A 09/09/2013   Procedure: CIRCUMCISION ADULT;  Surgeon: Bernestine Amass, MD;  Location[facility];  Service: Urology;  Laterality: N/A;  . COLECTOMY  05-16-2004  . CORONARY ANGIOPLASTY WITH STENT PLACEMENT  06/28/2012  DR COOPER   PCI W/  X1 DES to [place]. LAD/  LM  40% OSTIAL & 50-60% DISTAL /  50% PROX LCX/  30-40% PROX RCA & 50% MID RCA/   LVEF 65-70%  . CORONARY ARTERY BYPASS GRAFT N/A 03/31/2014   Procedure: CORONARY ARTERY BYPASS GRAFTING (CABG) times 3 using left internal mammary artery and right saphenous vein.;  Surgeon: Melrose Nakayama, MD;  Location: [place];  Service: Open Heart Surgery;  Laterality: N/A;  . DOBUTAMINE STRESS ECHO  06-08-2012   MODERATE HYPOKINESIS/ ISCHEMIA MID INFERIOR WALL  . ESOPHAGOGASTRODUODENOSCOPY  08/15/2012   Procedure: ESOPHAGOGASTRODUODENOSCOPY (EGD);  Surgeon: Wonda Horner, MD;  [facility]COPY;  Service: Endoscopy;  Laterality: N/A;  . ESOPHAGOGASTRODUODENOSCOPY N/A 02/17/2014   Procedure: ESOPHAGOGASTRODUODENOSCOPY (EGD);  Surgeon: Jeryl Columbia, MD;  Location: [facility] ENDOSCOPY;  Service: Endoscopy;  Laterality: N/A;  . ESOPHAGOGASTRODUODENOSCOPY (EGD) WITH PROPOFOL N/A 09/30/2017   Procedure: ESOPHAGOGASTRODUODENOSCOPY (EGD) WITH PROPOFOL;  Surgeon: Wonda Horner, MD;  Location[facility] ENDOSCOPY;  Service: Endoscopy;  Laterality: N/A;  . ESOPHAGOGASTRODUODENOSCOPY (EGD) WITH PROPOFOL N/A 12/20/2017   Procedure: ESOPHAGOGASTRODUODENOSCOPY (EGD) WITH PROPOFOL;  Surgeon: Clarene Essex, MD;  Location: [place];  Service: Endoscopy;  Laterality: N/A;  . HERNIA REPAIR    . INTRAOPERATIVE TRANSESOPHAGEAL ECHOCARDIOGRAM N/A 03/31/2014   Procedure: INTRAOPERATIVE TRANSESOPHAGEAL ECHOCARDIOGRAM;  Surgeon: Melrose Nakayama, MD;  Location: [place];  Service: Open Heart Surgery;  Laterality: N/A;  .  LAPAROSCOPIC UMBILICAL HERNIA REPAIR W/ MESH  06-06-2011  . LEFT HEART CATHETERIZATION WITH CORONARY ANGIOGRAM N/A 03/28/2014   Procedure: LEFT HEART CATHETERIZATION WITH CORONARY ANGIOGRAM;  Surgeon: Sinclair Grooms, MD;  Location: [facility] CATH LAB;  Service: Cardiovascular;  Laterality: N/A;  . LIPOMA EXCISION Left 08/25/2017   Procedure: EXCISION LIPOMA LEFT POSTERIOR THIGH;  Surgeon: Clovis Riley, MD;  Location: [place];  Service: General;  Laterality: Left;  . NEPHROLITHOTOMY  1990'S  . OPEN APPENDECTOMY W/ PARTIAL CECECTOMY  05-16-2004  . PERCUTANEOUS CORONARY STENT INTERVENTION (PCI-S) N/A 06/28/2012   Procedure: PERCUTANEOUS CORONARY STENT INTERVENTION (PCI-S);  Surgeon: Sherren Mocha, MD;  Location: [facility] CATH LAB;  Service: Cardiovascular;  Laterality: N/A;       Family History  Problem Relation Age of Onset  . Lung cancer Sister   . Cancer Sister        lung  . Cancer Mother   .  Cancer Father        died in his 59s.  . Cancer Brother        lung  . Coronary artery disease Other   . Diabetes Other   . Colon cancer Other   . Cancer Sister        lung    Social History   Tobacco Use  . Smoking status: Former Smoker    Packs/day: 2.00    Years: 40.00    Pack years: 80.00    Types: Cigarettes    Quit date: 08/29/1996    Years since quitting: 23.6  . Smokeless tobacco: Never Used  Vaping Use  . Vaping Use: Never used  Substance Use Topics  . Alcohol use: Yes    Alcohol/week: 8.0 standard drinks    Types: 8 Cans of beer per week    Comment: 2 beers every other day  . Drug use: No    Home Medications Prior to Admission medications   Medication Sig Start Date End Date Taking? Authorizing Provider  Accu-Chek Softclix Lancets lancets USE AS DIRECTED TO TEST BLOOD SUGAR FOUR TIMES DAILY Patient taking differently: 1 each by Other route in the morning, at noon, in the evening, and at bedtime.  03/13/20   [name], MD  albuterol (VENTOLIN HFA) 108 (90 Base)  MCG/ACT inhaler Inhale 2 puffs into the lungs every 6 (six) hours as needed for wheezing or shortness of breath. 03/12/20   [name], MD  atorvastatin (LIPITOR) 40 MG tablet Take 1 tablet (40 mg total) by mouth daily at 6 PM. 01/04/20   Nita Sells, MD  blood glucose meter kit and supplies Dispense based on patient and insurance preference. Use up to four times daily as directed. (FOR ICD-10 E10.9, E11.9). Patient taking differently: 1 each by Other route See admin instructions. Dispense based on patient and insurance preference. Use up to four times daily as directed. (FOR ICD-10 E10.9, E11.9). 03/12/20   [name], MD  ferrous sulfate 325 (65 FE) MG EC tablet Take 1 tablet (325 mg total) by mouth daily with breakfast. 01/16/20   [name], MD  gabapentin (NEURONTIN) 100 MG capsule TAKE 1 CAPSULE(100 MG) BY MOUTH TWICE DAILY Patient taking differently: Take 100 mg by mouth 2 (two) times daily. TAKE 1 CAPSULE(100 MG) BY MOUTH TWICE DAILY 05/14/19   [name], MD  glimepiride (AMARYL) 2 MG tablet Take 2 mg by mouth daily. 03/29/20   [provider]  metFORMIN (GLUCOPHAGE) 850 MG tablet Take 1 tablet (850 mg total) by mouth 2 (two) times daily with a meal. 05/30/19   [name], MD  nitroGLYCERIN (NITROSTAT) 0.4 MG SL tablet Place 1 tablet (0.4 mg total) under the tongue every 5 (five) minutes as needed for chest pain. 06/08/16   Nahser, Wonda Cheng, MD  spironolactone (ALDACTONE) 25 MG tablet Take 1 tablet (25 mg total) by mouth daily. 01/04/20   Nita Sells, MD  torsemide (DEMADEX) 20 MG tablet Take 40 mg (2 pills) every morning and every afternoon except Mon, Wed, Fri afternoons take only 20 mg (1 pill) 02/06/20   Nahser, Wonda Cheng, MD  umeclidinium-vilanterol (ANORO ELLIPTA) 62.5-25 MCG/INH AEPB Inhale 1 puff into the lungs daily. 03/12/20   [name], MD    Allergies    Patient has no known allergies.  Review of  Systems   Review of Systems  Respiratory: Positive for shortness of breath.   All other systems reviewed  and are negative.   Physical Exam Updated Vital Signs BP 127/74   Pulse 77   Temp 98.3 F (36.8 C) (Oral)   Resp (!) 25   SpO2 95%   Physical Exam Vitals and nursing note reviewed.  Constitutional:      Appearance: He is well-developed.  HENT:     Head: Normocephalic and atraumatic.  Cardiovascular:     Rate and Rhythm: Normal rate and regular rhythm.     Heart sounds: No murmur heard.   Pulmonary:     Effort: Pulmonary effort is normal. No respiratory distress.     Breath sounds: Normal breath sounds.  Abdominal:     Palpations: Abdomen is soft.     Tenderness: There is no abdominal tenderness. There is no guarding or rebound.  Genitourinary:    Comments: Brown stool in rectal vault. Musculoskeletal:        General: No tenderness.     Comments: 3+ pitting edema to BLE  Skin:    General: Skin is warm and dry.  Neurological:     Mental Status: He is alert and oriented to person, place, and time.  Psychiatric:        Behavior: Behavior normal.     ED Results / Procedures / Treatments   Labs (all labs ordered are listed, but only abnormal results are displayed) Labs Reviewed  BASIC METABOLIC PANEL - Abnormal; Notable for the following components:      Result Value   Glucose, Bld 178 (*)    BUN 27 (*)    Calcium 8.3 (*)    All other components within normal limits  CBC - Abnormal; Notable for the following components:   RBC 2.78 (*)    Hemoglobin 5.9 (*)    HCT 21.8 (*)    MCV 78.4 (*)    MCH 21.2 (*)    MCHC 27.1 (*)    RDW 18.6 (*)    Platelets 137 (*)    All other components within normal limits  BRAIN NATRIURETIC PEPTIDE - Abnormal; Notable for the following components:   B Natriuretic Peptide 293.0 (*)    All other components within normal limits  IRON AND TIBC - Abnormal; Notable for the following components:   Iron 12 (*)    TIBC 554 (*)     Saturation Ratios 2 (*)    All other components within normal limits  FERRITIN - Abnormal; Notable for the following components:   Ferritin 10 (*)    All other components within normal limits  POC OCCULT BLOOD, ED - Abnormal; Notable for the following components:   Fecal Occult Bld POSITIVE (*)    All other components within normal limits  SARS CORONAVIRUS 2 BY RT PCR (HOSPITAL ORDER, [place])  HEPATIC FUNCTION PANEL  PROTIME-INR  VITAMIN B12  FOLATE  BASIC METABOLIC PANEL  CBC  MAGNESIUM  TYPE AND SCREEN  PREPARE RBC (CROSSMATCH)  TROPONIN I (HIGH SENSITIVITY)    EKG EKG Interpretation  Date/Time:  Wednesday April 15 2020 15:35:04 EDT Ventricular Rate:  77 PR Interval:  192 QRS Duration: 94 QT Interval:  406 QTC Calculation: 459 R Axis:   84 Text Interpretation: Normal sinus rhythm Incomplete right bundle branch block Nonspecific ST abnormality Abnormal ECG Confirmed by Quintella Reichert [phone]) on 04/15/2020 8:44:57 PM   Radiology DG Chest 2 View  Result Date: 04/15/2020 CLINICAL DATA:  74 year old male with shortness of breath. EXAM: CHEST - 2 VIEW COMPARISON:  Chest radiograph  dated 12/26/2019. FINDINGS: There is mild cardiomegaly and mild vascular congestion and edema. Pneumonia is not excluded clinical correlation is recommended. No focal consolidation, pleural effusion, pneumothorax. Median sternotomy wires and CABG vascular clips. Atherosclerotic calcification of the aorta. No acute osseous pathology. IMPRESSION: Mild cardiomegaly with mild vascular congestion and edema. No focal consolidation. Electronically Signed   By: Anner Crete M.D.   On: 04/15/2020 16:28    Procedures Procedures (including critical care time) CRITICAL CARE Performed by: Quintella Reichert   Total critical care time: 35 minutes  Critical care time was exclusive of separately billable procedures and treating other patients.  Critical care was necessary to  treat or prevent imminent or life-threatening deterioration.  Critical care was time spent personally by me on the following activities: development of treatment plan with patient and/or surrogate as well as nursing, discussions with consultants, evaluation of patient's response to treatment, examination of patient, obtaining history from patient or surrogate, ordering and performing treatments and interventions, ordering and review of laboratory studies, ordering and review of radiographic studies, pulse oximetry and re-evaluation of patient's condition.  Medications Ordered in ED Medications  0.9 %  sodium chloride infusion (Manually program via Guardrails IV Fluids) (has no administration in time range)  sodium chloride flush (NS) 0.9 % injection 3 mL (3 mLs Intravenous Not Given 04/15/20 2224)  sodium chloride flush (NS) 0.9 % injection 3 mL (has no administration in time range)  0.9 %  sodium chloride infusion (has no administration in time range)  acetaminophen (TYLENOL) tablet 650 mg (has no administration in time range)  ondansetron (ZOFRAN) injection 4 mg (has no administration in time range)  furosemide (LASIX) injection 40 mg (has no administration in time range)  pantoprazole (PROTONIX) injection 40 mg (has no administration in time range)  atorvastatin (LIPITOR) tablet 40 mg (has no administration in time range)  gabapentin (NEURONTIN) capsule 100 mg (has no administration in time range)  spironolactone (ALDACTONE) tablet 25 mg (has no administration in time range)  umeclidinium-vilanterol (ANORO ELLIPTA) 62.5-25 MCG/INH 1 puff (has no administration in time range)  albuterol (PROVENTIL) (2.5 MG/3ML) 0.083% nebulizer solution 2.5 mg (has no administration in time range)  insulin aspart (novoLOG) injection 0-9 Units (has no administration in time range)  furosemide (LASIX) injection 40 mg (40 mg Intravenous Given 04/15/20 2116)    ED Course  I have reviewed the triage vital signs  and the nursing notes.  Pertinent labs & imaging results that were available during my care of the patient were reviewed by me and considered in my medical decision making (see chart for details).    MDM Rules/Calculators/A&P                         Patient presents the emergency department complaining of one week of progressive bilateral lower extremity edema as well as shortness of breath. He is dyspneic with minimal effort in the emergency department. Labs significant for worsening anemia compared to priors. Concern for symptomatic anemia. Will transfuse and provide diuresis for volume overload. Patient updated findings of studies recommendation for admission and he is in agreement treatment plan. Hospitalist consulted for admission.  Final Clinical Impression(s) / ED Diagnoses Final diagnoses:  Symptomatic anemia  Acute congestive heart failure, unspecified heart failure type [facility])    Rx / DC Orders ED Discharge Orders    None       Quintella Reichert, MD 04/15/20 2329

## 2020-04-15 NOTE — ED Triage Notes (Signed)
Pt reports bilateral leg swelling x 1 week and shortness of breath since yesterday. Compliant with diuretic.

## 2020-04-16 ENCOUNTER — Encounter (HOSPITAL_COMMUNITY): Payer: Self-pay | Admitting: Internal Medicine

## 2020-04-16 DIAGNOSIS — Z87820 Personal history of traumatic brain injury: Secondary | ICD-10-CM | POA: Diagnosis not present

## 2020-04-16 DIAGNOSIS — I864 Gastric varices: Secondary | ICD-10-CM | POA: Diagnosis not present

## 2020-04-16 DIAGNOSIS — K7689 Other specified diseases of liver: Secondary | ICD-10-CM | POA: Diagnosis not present

## 2020-04-16 DIAGNOSIS — Z87891 Personal history of nicotine dependence: Secondary | ICD-10-CM | POA: Diagnosis not present

## 2020-04-16 DIAGNOSIS — D7389 Other diseases of spleen: Secondary | ICD-10-CM | POA: Diagnosis not present

## 2020-04-16 DIAGNOSIS — E785 Hyperlipidemia, unspecified: Secondary | ICD-10-CM | POA: Diagnosis not present

## 2020-04-16 DIAGNOSIS — E118 Type 2 diabetes mellitus with unspecified complications: Secondary | ICD-10-CM | POA: Diagnosis not present

## 2020-04-16 DIAGNOSIS — Z801 Family history of malignant neoplasm of trachea, bronchus and lung: Secondary | ICD-10-CM | POA: Diagnosis not present

## 2020-04-16 DIAGNOSIS — K219 Gastro-esophageal reflux disease without esophagitis: Secondary | ICD-10-CM | POA: Diagnosis present

## 2020-04-16 DIAGNOSIS — I5033 Acute on chronic diastolic (congestive) heart failure: Secondary | ICD-10-CM | POA: Diagnosis not present

## 2020-04-16 DIAGNOSIS — K3189 Other diseases of stomach and duodenum: Secondary | ICD-10-CM | POA: Diagnosis not present

## 2020-04-16 DIAGNOSIS — K7581 Nonalcoholic steatohepatitis (NASH): Secondary | ICD-10-CM | POA: Diagnosis present

## 2020-04-16 DIAGNOSIS — Z8249 Family history of ischemic heart disease and other diseases of the circulatory system: Secondary | ICD-10-CM | POA: Diagnosis not present

## 2020-04-16 DIAGNOSIS — N200 Calculus of kidney: Secondary | ICD-10-CM | POA: Diagnosis not present

## 2020-04-16 DIAGNOSIS — F1010 Alcohol abuse, uncomplicated: Secondary | ICD-10-CM | POA: Diagnosis present

## 2020-04-16 DIAGNOSIS — Z20822 Contact with and (suspected) exposure to covid-19: Secondary | ICD-10-CM | POA: Diagnosis not present

## 2020-04-16 DIAGNOSIS — E669 Obesity, unspecified: Secondary | ICD-10-CM | POA: Diagnosis present

## 2020-04-16 DIAGNOSIS — Z951 Presence of aortocoronary bypass graft: Secondary | ICD-10-CM | POA: Diagnosis not present

## 2020-04-16 DIAGNOSIS — I2510 Atherosclerotic heart disease of native coronary artery without angina pectoris: Secondary | ICD-10-CM | POA: Diagnosis not present

## 2020-04-16 DIAGNOSIS — R188 Other ascites: Secondary | ICD-10-CM | POA: Diagnosis not present

## 2020-04-16 DIAGNOSIS — R160 Hepatomegaly, not elsewhere classified: Secondary | ICD-10-CM | POA: Diagnosis not present

## 2020-04-16 DIAGNOSIS — Z800 Family history of malignant neoplasm of digestive organs: Secondary | ICD-10-CM | POA: Diagnosis not present

## 2020-04-16 DIAGNOSIS — Z6836 Body mass index (BMI) 36.0-36.9, adult: Secondary | ICD-10-CM | POA: Diagnosis not present

## 2020-04-16 DIAGNOSIS — Z87442 Personal history of urinary calculi: Secondary | ICD-10-CM | POA: Diagnosis not present

## 2020-04-16 DIAGNOSIS — G4733 Obstructive sleep apnea (adult) (pediatric): Secondary | ICD-10-CM | POA: Diagnosis not present

## 2020-04-16 DIAGNOSIS — K921 Melena: Secondary | ICD-10-CM | POA: Diagnosis not present

## 2020-04-16 DIAGNOSIS — I110 Hypertensive heart disease with heart failure: Secondary | ICD-10-CM | POA: Diagnosis present

## 2020-04-16 DIAGNOSIS — K766 Portal hypertension: Secondary | ICD-10-CM | POA: Diagnosis not present

## 2020-04-16 DIAGNOSIS — R69 Illness, unspecified: Secondary | ICD-10-CM | POA: Diagnosis not present

## 2020-04-16 DIAGNOSIS — K7460 Unspecified cirrhosis of liver: Secondary | ICD-10-CM | POA: Diagnosis not present

## 2020-04-16 DIAGNOSIS — D649 Anemia, unspecified: Secondary | ICD-10-CM | POA: Diagnosis not present

## 2020-04-16 LAB — BASIC METABOLIC PANEL
Anion gap: 11 (ref 5–15)
BUN: 22 mg/dL (ref 8–23)
CO2: 23 mmol/L (ref 22–32)
Calcium: 8.4 mg/dL — ABNORMAL LOW (ref 8.9–10.3)
Chloride: 103 mmol/L (ref 98–111)
Creatinine, Ser: 1.1 mg/dL (ref 0.61–1.24)
GFR calc Af Amer: >60 mL/min (ref >60)
GFR calc non Af Amer: >60 mL/min (ref >60)
Glucose, Bld: 82 mg/dL (ref 70–99)
Potassium: 3.4 mmol/L — ABNORMAL LOW (ref 3.5–5.1)
Sodium: 137 mmol/L (ref 135–145)

## 2020-04-16 LAB — CBC
HCT: 25.4 % — ABNORMAL LOW (ref 39.0–52.0)
HCT: 27.4 % — ABNORMAL LOW (ref 39.0–52.0)
Hemoglobin: 6.8 g/dL — CL (ref 13.0–17.0)
Hemoglobin: 7.7 g/dL — ABNORMAL LOW (ref 13.0–17.0)
MCH: 21.1 pg — ABNORMAL LOW (ref 26.0–34.0)
MCH: 22.3 pg — ABNORMAL LOW (ref 26.0–34.0)
MCHC: 26.8 g/dL — ABNORMAL LOW (ref 30.0–36.0)
MCHC: 28.1 g/dL — ABNORMAL LOW (ref 30.0–36.0)
MCV: 78.9 fL — ABNORMAL LOW (ref 80.0–100.0)
MCV: 79.4 fL — ABNORMAL LOW (ref 80.0–100.0)
Platelets: UNDETERMINED 10*3/uL (ref 150–400)
Platelets: 143 10*3/uL — ABNORMAL LOW (ref 150–400)
RBC: 3.22 MIL/uL — ABNORMAL LOW (ref 4.22–5.81)
RBC: 3.45 MIL/uL — ABNORMAL LOW (ref 4.22–5.81)
RDW: 19.3 % — ABNORMAL HIGH (ref 11.5–15.5)
RDW: 19.5 % — ABNORMAL HIGH (ref 11.5–15.5)
WBC: 4.1 10*3/uL (ref 4.0–10.5)
WBC: 4.9 10*3/uL (ref 4.0–10.5)
nRBC: 0 % (ref 0.0–0.2)
nRBC: 0 % (ref 0.0–0.2)

## 2020-04-16 LAB — CBG MONITORING, ED
Glucose-Capillary: 125 mg/dL — ABNORMAL HIGH (ref 70–99)
Glucose-Capillary: 84 mg/dL (ref 70–99)
Glucose-Capillary: 86 mg/dL (ref 70–99)
Glucose-Capillary: 90 mg/dL (ref 70–99)

## 2020-04-16 LAB — PREPARE RBC (CROSSMATCH)

## 2020-04-16 LAB — GLUCOSE, CAPILLARY
Glucose-Capillary: 121 mg/dL — ABNORMAL HIGH (ref 70–99)
Glucose-Capillary: 102 mg/dL — ABNORMAL HIGH (ref 70–99)

## 2020-04-16 LAB — MAGNESIUM: Magnesium: 2.1 mg/dL (ref 1.7–2.4)

## 2020-04-16 MED ORDER — SODIUM CHLORIDE 0.9% IV SOLUTION: Freq: Once | INTRAVENOUS | Status: AC

## 2020-04-16 MED ORDER — FUROSEMIDE 10 MG/ML IJ SOLN: 20.0000 mg | Freq: Once | INTRAMUSCULAR | Status: DC

## 2020-04-16 MED ORDER — SODIUM CHLORIDE 0.9 % IV SOLN: INTRAVENOUS | Status: DC

## 2020-04-16 NOTE — Consult Note (Signed)
Referring Provider: Dr. Zada Finders Primary Care Physician:  [name], MD Primary Gastroenterologist:  Dr. Penelope Coop [facility])  Reason for Consultation:  Symptomatic anemia, FOBT positive stool  HPI: [name] is a 74 y.o. male with past medical history noted below to include CAD s/p CABG, HFpEF (EF 55-60% as of 07/2019), hepatic cirrhosis, history of GI bleed with (gastric AVMs), GERD/Barrett's esophagus presenting for consultation of symptomatic anemia.  Patient presented to the ED yesterday shortness of breath and worsening lower extremity edema was found to be severely anemic with hemoglobin of 5.9 with FOBT positive stool.  Patient denies any gastrointestinal complaints.  Denies nausea, vomiting, hematemesis, GERD, dysphagia, abdominal pain, early satiety, unexplained weight loss, diarrhea, changes in stool, melena, or hematochezia.  He is on 81 mg aspirin daily but denies blood thinner or NSAID use.  Denies family history of colon cancer or gastrointestinal malignancy.  Last EGD was 11/2017 and was pertinent for 5 nonbleeding AVMs s/p APC as well as nonbleeding duodenal diverticulum.  Last colonoscopy was 07/2013 was pertinent for 2 nonbleeding colonic AVMs and 3 hyperplastic polyps.  Past Medical History:  Diagnosis Date   Arthritis    "joints tighten up sometimes" (03/27/2104)   CHF (congestive heart failure) (HCC)    Chronic lower back pain    Coronary artery disease    a. 05/2012 Cath/PCI: LM 40ost, 50-60d, LAD 99p ruptured plaque (3.0x28 DES), LCX 50p/m, RCA 30-40p, 58m EF 65-70%.   Coronary artery disease involving native coronary artery of native heart without angina pectoris    Severe left main disease at catheterization July 2015  CABG x3 with a LIMA to the LAD, SVG to the OM, SVG to the PDA on 03/31/14. EF 60% by cath.    GERD without esophagitis 08/04/2010   Hepatic cirrhosis ([place]    a. Dx 01/2014 - CT a/p    History of blood transfusion    "related  to bleeding ulcers"   History of concussion    1976--  NO RESIDUAL   History of GI bleed    a. UGIB 07/2012;  b. 01/2014 admission with GIB/FOB stool req 1U prbc's->EGD showed portal gastropathy, barrett's esoph, and chronic active h. pylori gastritis.   History of gout    2007 &  2008  LEFT LEG-- NO ISSUE SINCE   Hyperlipidemia    Iron deficiency anemia    Kidney stones    OA (osteoarthritis of spine)    LOWER BACK--  INTERMITTANT LEFT LEG NUMBNESS   OSA (obstructive sleep apnea)    PULMOLOGIST-  DR CLANCE--  MODERATE OSA  STARTED CPAP 2012--  BUT CURRENTLY HAS NOT USED PAST 6 MONTHS   Phimosis    a. s/p circumcision 2015.   Type 2 diabetes mellitus ([place]    Unspecified essential hypertension     Past Surgical History:  Procedure Laterality Date   ANKLE FRACTURE SURGERY Right 1989   "plate put in"   APPENDECTOMY  05-16-2004   open   CIRCUMCISION N/A 09/09/2013   Procedure: CIRCUMCISION ADULT;  Surgeon: DBernestine Amass MD;  [facility]  Service: Urology;  Laterality: N/A;   COLECTOMY  05-16-2004   CORONARY ANGIOPLASTY WITH STENT PLACEMENT  06/28/2012  DR COOPER   PCI W/  X1 [place] to [place] L[place]/  LM  40% OSTIAL & 50-60% DISTAL /  50% PROX LCX/  30-40% PROX RCA & 50% MID RCA/   LVEF 65-70%   CORONARY ARTERY BYPASS GRAFT N/A  03/31/2014   Procedure: CORONARY ARTERY BYPASS GRAFTING (CABG) times 3 using left internal mammary artery and right saphenous vein.;  Surgeon: Melrose Nakayama, MD;  Location: [place];  Service: Open Heart Surgery;  Laterality: N/A;   DOBUTAMINE STRESS ECHO  06-08-2012   MODERATE HYPOKINESIS/ ISCHEMIA MID INFERIOR WALL   ESOPHAGOGASTRODUODENOSCOPY  08/15/2012   Procedure: ESOPHAGOGASTRODUODENOSCOPY (EGD);  Surgeon: Wonda Horner, MD;  Location: [facility] ENDOSCOPY;  Service: Endoscopy;  Laterality: N/A;   ESOPHAGOGASTRODUODENOSCOPY N/A 02/17/2014   Procedure: ESOPHAGOGASTRODUODENOSCOPY (EGD);  Surgeon: Jeryl Columbia, MD;   Location: [facility] ENDOSCOPY;  Service: Endoscopy;  Laterality: N/A;   ESOPHAGOGASTRODUODENOSCOPY (EGD) WITH PROPOFOL N/A 09/30/2017   Procedure: ESOPHAGOGASTRODUODENOSCOPY (EGD) WITH PROPOFOL;  Surgeon: Wonda Horner, MD;  Location: [facility] ENDOSCOPY;  Service: Endoscopy;  Laterality: N/A;   ESOPHAGOGASTRODUODENOSCOPY (EGD) WITH PROPOFOL N/A 12/20/2017   Procedure: ESOPHAGOGASTRODUODENOSCOPY (EGD) WITH PROPOFOL;  Surgeon: Clarene Essex, MD;  Location: [place];  Service: Endoscopy;  Laterality: N/A;   HERNIA REPAIR     INTRAOPERATIVE TRANSESOPHAGEAL ECHOCARDIOGRAM N/A 03/31/2014   Procedure: INTRAOPERATIVE TRANSESOPHAGEAL ECHOCARDIOGRAM;  Surgeon: Melrose Nakayama, MD;  Location: [place];  Service: Open Heart Surgery;  Laterality: N/A;   LAPAROSCOPIC UMBILICAL HERNIA REPAIR W/ MESH  06-06-2011   LEFT HEART CATHETERIZATION WITH CORONARY ANGIOGRAM N/A 03/28/2014   Procedure: LEFT HEART CATHETERIZATION WITH CORONARY ANGIOGRAM;  Surgeon: Sinclair Grooms, MD;  Location: [facility] C[facility] LAB;  Service: Cardiovascular;  Laterality: N/A;   LIPOMA EXCISION Left 08/25/2017   Procedure: EXCISION LIPOMA LEFT POSTERIOR THIGH;  Surgeon: Clovis Riley, MD;  Location: [place];  Service: General;  Laterality: Left;   NEPHROLITHOTOMY  1990'S   OPEN APPENDECTOMY W/ PARTIAL CECECTOMY  05-16-2004   PERCUTANEOUS CORONARY STENT INTERVENTION (PCI-S) N/A 06/28/2012   Procedure: PERCUTANEOUS CORONARY STENT INTERVENTION (PCI-S);  Surgeon: Sherren Mocha, MD;  Location: [facility] CATH LAB;  Service: Cardiovascular;  Laterality: N/A;    Prior to Admission medications   Medication Sig Start Date End Date Taking? Authorizing Provider  Accu-Chek Softclix Lancets lancets USE AS DIRECTED TO TEST BLOOD SUGAR FOUR TIMES DAILY Patient taking differently: 1 each by Other route in the morning, at noon, in the evening, and at bedtime.  03/13/20   [name], MD  albuterol (VENTOLIN HFA) 108 (90 Base) MCG/ACT inhaler Inhale 2 puffs into  the lungs every 6 (six) hours as needed for wheezing or shortness of breath. 03/12/20   [name], MD  atorvastatin (LIPITOR) 40 MG tablet Take 1 tablet (40 mg total) by mouth daily at 6 PM. 01/04/20   Nita Sells, MD  blood glucose meter kit and supplies Dispense based on patient and insurance preference. Use up to four times daily as directed. (FOR ICD-10 E10.9, E11.9). Patient taking differently: 1 each by Other route See admin instructions. Dispense based on patient and insurance preference. Use up to four times daily as directed. (FOR ICD-10 E10.9, E11.9). 03/12/20   [name], MD  ferrous sulfate 325 (65 FE) MG EC tablet Take 1 tablet (325 mg total) by mouth daily with breakfast. 01/16/20   [name], MD  gabapentin (NEURONTIN) 100 MG capsule TAKE 1 CAPSULE(100 MG) BY MOUTH TWICE DAILY Patient taking differently: Take 100 mg by mouth 2 (two) times daily. TAKE 1 CAPSULE(100 MG) BY MOUTH TWICE DAILY 05/14/19   [name], MD  glimepiride (AMARYL) 2 MG tablet Take 2 mg by mouth daily. 03/29/20   [provider]  metFORMIN (GLUCOPHAGE) 850  MG tablet Take 1 tablet (850 mg total) by mouth 2 (two) times daily with a meal. 05/30/19   [name], MD  nitroGLYCERIN (NITROSTAT) 0.4 MG SL tablet Place 1 tablet (0.4 mg total) under the tongue every 5 (five) minutes as needed for chest pain. 06/08/16   Nahser, Wonda Cheng, MD  spironolactone (ALDACTONE) 25 MG tablet Take 1 tablet (25 mg total) by mouth daily. 01/04/20   Nita Sells, MD  torsemide (DEMADEX) 20 MG tablet Take 40 mg (2 pills) every morning and every afternoon except Mon, Wed, Fri afternoons take only 20 mg (1 pill) 02/06/20   Nahser, Wonda Cheng, MD  umeclidinium-vilanterol (ANORO ELLIPTA) 62.5-25 MCG/INH AEPB Inhale 1 puff into the lungs daily. 03/12/20   [name], MD    Scheduled Meds:  atorvastatin  40 mg Oral q1800   furosemide  40 mg Intravenous Q12H    gabapentin  100 mg Oral BID   insulin aspart  0-9 Units Subcutaneous Q4H   pantoprazole (PROTONIX) IV  40 mg Intravenous Q12H   sodium chloride flush  3 mL Intravenous Q12H   spironolactone  25 mg Oral Daily   umeclidinium-vilanterol  1 puff Inhalation Daily   Continuous Infusions:  sodium chloride     PRN Meds:.sodium chloride, acetaminophen, albuterol, ondansetron (ZOFRAN) IV, sodium chloride flush  Allergies as of 04/15/2020   (No Known Allergies)    Family History  Problem Relation Age of Onset   Lung cancer Sister    Cancer Sister        lung   Cancer Mother    Cancer Father        died in his 32s.   Cancer Brother        lung   Coronary artery disease Other    Diabetes Other    Colon cancer Other    Cancer Sister        lung    Social History   Socioeconomic History   Marital status: Married    Spouse name: Not on file   Number of children: Not on file   Years of education: Not on file   Highest education level: Not on file  Occupational History   Not on file  Tobacco Use   Smoking status: Former Smoker    Packs/day: 2.00    Years: 40.00    Pack years: 80.00    Types: Cigarettes    Quit date: 08/29/1996    Years since quitting: 23.6   Smokeless tobacco: Never Used  Vaping Use   Vaping Use: Never used  Substance and Sexual Activity   Alcohol use: Yes    Alcohol/week: 8.0 standard drinks    Types: 8 Cans of beer per week    Comment: 2 beers every other day   Drug use: No   Sexual activity: Yes  Other Topics Concern   Not on file  Social History Narrative   Lives in [place] with wife, son, and son's family.   Social Determinants of Health   Financial Resource Strain:    Difficulty of Paying Living Expenses: Not on file  Food Insecurity:    Worried About Charity fundraiser in the Last Year: Not on file   [employer] of Food in the Last Year: Not on file  Transportation Needs:    Lack of Transportation (Medical): Not on  file   Lack of Transportation (Non-Medical): Not on file  Physical Activity:    Days of Exercise per Week: Not  on file   Minutes of Exercise per Session: Not on file  Stress:    Feeling of Stress : Not on file  Social Connections:    Frequency of Communication with Friends and Family: Not on file   Frequency of Social Gatherings with Friends and Family: Not on file   Attends Religious Services: Not on file   Active Member of Clubs or Organizations: Not on file   Attends Archivist Meetings: Not on file   Marital Status: Not on file  Intimate Partner Violence:    Fear of Current or Ex-Partner: Not on file   Emotionally Abused: Not on file   Physically Abused: Not on file   Sexually Abused: Not on file    Review of Systems: Review of Systems  Constitutional: Positive for malaise/fatigue. Negative for chills, fever and weight loss.  HENT: Negative for sore throat.   Eyes: Negative for pain and redness.  Respiratory: Positive for cough and shortness of breath. Negative for stridor.   Cardiovascular: Negative for chest pain and palpitations.  Gastrointestinal: Negative for abdominal pain, blood in stool, constipation, diarrhea, heartburn, melena, nausea and vomiting.  Genitourinary: Negative for flank pain and hematuria.  Musculoskeletal: Negative for falls and myalgias.  Skin: Negative for itching and rash.  Neurological: Negative for seizures and loss of consciousness.  Endo/Heme/Allergies: Negative for polydipsia. Does not bruise/bleed easily.  Psychiatric/Behavioral: Negative for substance abuse. The patient is not nervous/anxious.     Physical Exam: Physical Exam Constitutional:      General: He is not in acute distress.    Appearance: He is obese.  HENT:     Head: Normocephalic and atraumatic.     Nose: Nose normal.     Mouth/Throat:     Mouth: Mucous membranes are moist.     Pharynx: Oropharynx is clear.  Eyes:     Extraocular Movements:  Extraocular movements intact.     Comments: Conjunctival pallor  Cardiovascular:     Rate and Rhythm: Normal rate and regular rhythm.     Pulses: Normal pulses.     Heart sounds: Normal heart sounds.  Pulmonary:     Effort: Pulmonary effort is normal. No respiratory distress.     Breath sounds: Normal breath sounds.  Abdominal:     General: Bowel sounds are normal. There is no distension.     Palpations: Abdomen is soft. There is no mass.     Tenderness: There is no abdominal tenderness. There is no guarding or rebound.     Hernia: No hernia is present.  Musculoskeletal:        General: No tenderness.     Cervical back: Normal range of motion and neck supple.     Right lower leg: Edema present.     Left lower leg: Edema present.  Skin:    General: Skin is warm and dry.  Neurological:     General: No focal deficit present.     Mental Status: He is oriented to person, place, and time. He is lethargic.  Psychiatric:        Mood and Affect: Mood normal.        Behavior: Behavior normal. Behavior is cooperative.     Vital signs: Vitals:   04/16/20 0515 04/16/20 0641  BP:  106/68  Pulse: 73 71  Resp: 14 16  Temp:    SpO2: 100% 100%      GI:  Lab Results: Recent Labs    04/15/20 1538 04/16/20 0358  WBC 4.1 4.1  HGB 5.9* 6.8*  HCT 21.8* 25.4*  PLT 137* PLATELET CLUMPS NOTED ON SMEAR, UNABLE TO ESTIMATE   BMET Recent Labs    04/15/20 1538 04/16/20 0358  NA 136 137  K 3.6 3.4*  CL 100 103  CO2 25 23  GLUCOSE 178* 82  BUN 27* 22  CREATININE 1.18 1.10  CALCIUM 8.3* 8.4*   LFT Recent Labs    04/15/20 2102  PROT 7.1  ALBUMIN 3.6  AST 23  ALT 17  ALKPHOS 94  BILITOT 1.0  BILIDIR 0.1  IBILI 0.9   PT/INR Recent Labs    04/15/20 2102  LABPROT 14.2  INR 1.1     Studies/Results: DG Chest 2 View  Result Date: 04/15/2020 CLINICAL DATA:  74 year old male with shortness of breath. EXAM: CHEST - 2 VIEW COMPARISON:  Chest radiograph dated 12/26/2019.  FINDINGS: There is mild cardiomegaly and mild vascular congestion and edema. Pneumonia is not excluded clinical correlation is recommended. No focal consolidation, pleural effusion, pneumothorax. Median sternotomy wires and CABG vascular clips. Atherosclerotic calcification of the aorta. No acute osseous pathology. IMPRESSION: Mild cardiomegaly with mild vascular congestion and edema. No focal consolidation. Electronically Signed   By: Anner Crete M.D.   On: 04/15/2020 16:28    Impression: GI bleeding: symptomatic anemia, FOBT positive stools.  Most likely subacute bleeding from gastric AVMs.  Do not suspect variceal bleeding or portal hypertensive gastropathy. -Hgb 5.9 on arrival 8/18, now 6.8 after 1 u pRBCs overnight; baseline Hgb 7.2 aas of 12/2019 -BUN 22/ Cr 1.10, BUN decreased from 27 yesterday -Iron deficiency: Ferritin 10; iron 12%, iron saturation 2%, TIBC elevated to 554  Hepatic cirrhosis per imaging, compensated. -LFTs normal: T. Bili 1.0/ AST 23/ ALT 17/ ALP 94 today -INR 1.1 today -Platelets 137 K/uL as of 8/18  Plan: There is no availability in the endoscopy unit today; however, patient is hemodynamically stable.  Thus, we will proceed with EGD tomorrow.  I thoroughly discussed the procedure with the patient to include nature, alternatives, benefits, and risks (including but not limited to bleeding, infection, perforation, anesthesia/cardiac and pulmonary complications).  Patient verbalized understanding gave verbal consent to proceed with EGD.  Clear liquid diet with n.p.o. after midnight.  Continue Protonix 40 mg IV twice daily.  Continue to monitor H&H with transfusion as needed to maintain hemoglobin around 7.  [facility] will follow.   LOS: 0 days   Salley Slaughter  PA-C 04/16/2020, 8:58 AM  Contact #  [phone]

## 2020-04-16 NOTE — ED Notes (Signed)
Pt's CBG result was 90. Informed Hilary - RN.

## 2020-04-16 NOTE — ED Notes (Signed)
Pt given decaffeinated coffee, per Deidre Ala - RN.

## 2020-04-16 NOTE — H&P (View-Only) (Signed)
Referring Provider: Dr. Vishal Patel °Primary Care Physician:  Crawford, Elizabeth A, MD °Primary Gastroenterologist:  Dr. Ganem ([facility]) ° °Reason for Consultation:  Symptomatic anemia, FOBT positive stool ° °HPI: [name] is a 74 y.o. male with past medical history noted below to include CAD s/p CABG, HFpEF (EF 55-60% as of 07/2019), hepatic cirrhosis, history of GI bleed with (gastric AVMs), GERD/Barrett's esophagus presenting for consultation of symptomatic anemia. ° °Patient presented to the ED yesterday shortness of breath and worsening lower extremity edema was found to be severely anemic with hemoglobin of 5.9 with FOBT positive stool. ° °Patient denies any gastrointestinal complaints.  Denies nausea, vomiting, hematemesis, GERD, dysphagia, abdominal pain, early satiety, unexplained weight loss, diarrhea, changes in stool, melena, or hematochezia. ° °He is on 81 mg aspirin daily but denies blood thinner or NSAID use.  Denies family history of colon cancer or gastrointestinal malignancy. ° °Last EGD was 11/2017 and was pertinent for 5 nonbleeding AVMs s/p APC as well as nonbleeding duodenal diverticulum. ° °Last colonoscopy was 07/2013 was pertinent for 2 nonbleeding colonic AVMs and 3 hyperplastic polyps. ° °Past Medical History:  °Diagnosis Date  °• Arthritis   ° "joints tighten up sometimes" (03/27/2104)  °• CHF (congestive heart failure) (HCC)   °• Chronic lower back pain   °• Coronary artery disease   ° a. 05/2012 Cath/PCI: LM 40ost, 50-60d, LAD 99p ruptured plaque (3.0x28 DES), LCX 50p/m, RCA 30-40p, 50m, EF 65-70%.  °• Coronary artery disease involving native coronary artery of native heart without angina pectoris   ° Severe left main disease at catheterization July 2015  CABG x3 with a LIMA to the LAD, SVG to the OM, SVG to the PDA on 03/31/14. EF 60% by cath.   °• GERD without esophagitis 08/04/2010  °• Hepatic cirrhosis (HCC)   ° a. Dx 01/2014 - CT a/p   °• History of blood transfusion   ° "related  to bleeding ulcers"  °• History of concussion   ° 1976--  NO RESIDUAL  °• History of GI bleed   ° a. UGIB 07/2012;  b. 01/2014 admission with GIB/FOB stool req 1U prbc's->EGD showed portal gastropathy, barrett's esoph, and chronic active h. pylori gastritis.  °• History of gout   ° 2007 &  2008  LEFT LEG-- NO ISSUE SINCE  °• Hyperlipidemia   °• Iron deficiency anemia   °• Kidney stones   °• OA (osteoarthritis of spine)   ° LOWER BACK--  INTERMITTANT LEFT LEG NUMBNESS  °• OSA (obstructive sleep apnea)   ° PULMOLOGIST-  DR CLANCE--  MODERATE OSA  STARTED CPAP 2012--  BUT CURRENTLY HAS NOT USED PAST 6 MONTHS  °• Phimosis   ° a. s/p circumcision 2015.  °• Type 2 diabetes mellitus (HCC)   °• Unspecified essential hypertension   ° ° °Past Surgical History:  °Procedure Laterality Date  °• ANKLE FRACTURE SURGERY Right 1989  ° "plate put in"  °• APPENDECTOMY  05-16-2004  ° open  °• CIRCUMCISION N/A 09/09/2013  ° Procedure: CIRCUMCISION ADULT;  Surgeon: David S Grapey, MD;  Location: [facility];  Service: Urology;  Laterality: N/A;  °• COLECTOMY  05-16-2004  °• CORONARY ANGIOPLASTY WITH STENT PLACEMENT  06/28/2012  DR COOPER  ° PCI W/  X1 DES to PROX. LAD/  LM  40% OSTIAL & 50-60% DISTAL /  50% PROX LCX/  30-40% PROX RCA & 50% MID RCA/   LVEF 65-70%  °• CORONARY ARTERY BYPASS GRAFT N/A   03/31/2014  ° Procedure: CORONARY ARTERY BYPASS GRAFTING (CABG) times 3 using left internal mammary artery and right saphenous vein.;  Surgeon: Steven C Hendrickson, MD;  Location: [facility];  Service: Open Heart Surgery;  Laterality: N/A;  °• DOBUTAMINE STRESS ECHO  06-08-2012  ° MODERATE HYPOKINESIS/ ISCHEMIA MID INFERIOR WALL  °• ESOPHAGOGASTRODUODENOSCOPY  08/15/2012  ° Procedure: ESOPHAGOGASTRODUODENOSCOPY (EGD);  Surgeon: Salem F Ganem, MD;  Location: [facility];  Service: Endoscopy;  Laterality: N/A;  °• ESOPHAGOGASTRODUODENOSCOPY N/A 02/17/2014  ° Procedure: ESOPHAGOGASTRODUODENOSCOPY (EGD);  Surgeon: Marc E Magod, MD;   Location: [facility];  Service: Endoscopy;  Laterality: N/A;  °• ESOPHAGOGASTRODUODENOSCOPY (EGD) WITH PROPOFOL N/A 09/30/2017  ° Procedure: ESOPHAGOGASTRODUODENOSCOPY (EGD) WITH PROPOFOL;  Surgeon: Ganem, Salem F, MD;  Location: [facility];  Service: Endoscopy;  Laterality: N/A;  °• ESOPHAGOGASTRODUODENOSCOPY (EGD) WITH PROPOFOL N/A 12/20/2017  ° Procedure: ESOPHAGOGASTRODUODENOSCOPY (EGD) WITH PROPOFOL;  Surgeon: Magod, Marc, MD;  Location: [facility];  Service: Endoscopy;  Laterality: N/A;  °• HERNIA REPAIR    °• INTRAOPERATIVE TRANSESOPHAGEAL ECHOCARDIOGRAM N/A 03/31/2014  ° Procedure: INTRAOPERATIVE TRANSESOPHAGEAL ECHOCARDIOGRAM;  Surgeon: Steven C Hendrickson, MD;  Location: [facility];  Service: Open Heart Surgery;  Laterality: N/A;  °• LAPAROSCOPIC UMBILICAL HERNIA REPAIR W/ MESH  06-06-2011  °• LEFT HEART CATHETERIZATION WITH CORONARY ANGIOGRAM N/A 03/28/2014  ° Procedure: LEFT HEART CATHETERIZATION WITH CORONARY ANGIOGRAM;  Surgeon: Henry W Smith III, MD;  Location: [facility];  Service: Cardiovascular;  Laterality: N/A;  °• LIPOMA EXCISION Left 08/25/2017  ° Procedure: EXCISION LIPOMA LEFT POSTERIOR THIGH;  Surgeon: Connor, Chelsea A, MD;  Location: [facility];  Service: General;  Laterality: Left;  °• NEPHROLITHOTOMY  1990'S  °• OPEN APPENDECTOMY W/ PARTIAL CECECTOMY  05-16-2004  °• PERCUTANEOUS CORONARY STENT INTERVENTION (PCI-S) N/A 06/28/2012  ° Procedure: PERCUTANEOUS CORONARY STENT INTERVENTION (PCI-S);  Surgeon: Michael Cooper, MD;  Location: [facility];  Service: Cardiovascular;  Laterality: N/A;  ° ° °Prior to Admission medications   °Medication Sig Start Date End Date Taking? Authorizing Provider  °Accu-Chek Softclix Lancets lancets USE AS DIRECTED TO TEST BLOOD SUGAR FOUR TIMES DAILY °Patient taking differently: 1 each by Other route in the morning, at noon, in the evening, and at bedtime.  03/13/20   Crawford, Elizabeth A, MD  °albuterol (VENTOLIN HFA) 108 (90 Base) MCG/ACT inhaler Inhale 2 puffs into  the lungs every 6 (six) hours as needed for wheezing or shortness of breath. 03/12/20   Crawford, Elizabeth A, MD  °atorvastatin (LIPITOR) 40 MG tablet Take 1 tablet (40 mg total) by mouth daily at 6 PM. 01/04/20   Samtani, Jai-Gurmukh, MD  °blood glucose meter kit and supplies Dispense based on patient and insurance preference. Use up to four times daily as directed. (FOR ICD-10 E10.9, E11.9). °Patient taking differently: 1 each by Other route See admin instructions. Dispense based on patient and insurance preference. Use up to four times daily as directed. (FOR ICD-10 E10.9, E11.9). 03/12/20   Crawford, Elizabeth A, MD  °ferrous sulfate 325 (65 FE) MG EC tablet Take 1 tablet (325 mg total) by mouth daily with breakfast. 01/16/20   Crawford, Elizabeth A, MD  °gabapentin (NEURONTIN) 100 MG capsule TAKE 1 CAPSULE(100 MG) BY MOUTH TWICE DAILY °Patient taking differently: Take 100 mg by mouth 2 (two) times daily. TAKE 1 CAPSULE(100 MG) BY MOUTH TWICE DAILY 05/14/19   Crawford, Elizabeth A, MD  °glimepiride (AMARYL) 2 MG tablet Take 2 mg by mouth daily. 03/29/20   [provider]  °metFORMIN (GLUCOPHAGE) 850   MG tablet Take 1 tablet (850 mg total) by mouth 2 (two) times daily with a meal. 05/30/19   Crawford, Elizabeth A, MD  °nitroGLYCERIN (NITROSTAT) 0.4 MG SL tablet Place 1 tablet (0.4 mg total) under the tongue every 5 (five) minutes as needed for chest pain. 06/08/16   Nahser, Philip J, MD  °spironolactone (ALDACTONE) 25 MG tablet Take 1 tablet (25 mg total) by mouth daily. 01/04/20   Samtani, Jai-Gurmukh, MD  °torsemide (DEMADEX) 20 MG tablet Take 40 mg (2 pills) every morning and every afternoon except Mon, Wed, Fri afternoons take only 20 mg (1 pill) 02/06/20   Nahser, Philip J, MD  °umeclidinium-vilanterol (ANORO ELLIPTA) 62.5-25 MCG/INH AEPB Inhale 1 puff into the lungs daily. 03/12/20   Crawford, Elizabeth A, MD  ° ° °Scheduled Meds: °• atorvastatin  40 mg Oral q1800  °• furosemide  40 mg Intravenous Q12H  °•  gabapentin  100 mg Oral BID  °• insulin aspart  0-9 Units Subcutaneous Q4H  °• pantoprazole (PROTONIX) IV  40 mg Intravenous Q12H  °• sodium chloride flush  3 mL Intravenous Q12H  °• spironolactone  25 mg Oral Daily  °• umeclidinium-vilanterol  1 puff Inhalation Daily  ° °Continuous Infusions: °• sodium chloride    ° °PRN Meds:.sodium chloride, acetaminophen, albuterol, ondansetron (ZOFRAN) IV, sodium chloride flush ° °Allergies as of 04/15/2020  °• (No Known Allergies)  ° ° °Family History  °Problem Relation Age of Onset  °• Lung cancer Sister   °• Cancer Sister   °     lung  °• Cancer Mother   °• Cancer Father   °     died in his 70s.  °• Cancer Brother   °     lung  °• Coronary artery disease Other   °• Diabetes Other   °• Colon cancer Other   °• Cancer Sister   °     lung  ° ° °Social History  ° °Socioeconomic History  °• Marital status: Married  °  Spouse name: Not on file  °• Number of children: Not on file  °• Years of education: Not on file  °• Highest education level: Not on file  °Occupational History  °• Not on file  °Tobacco Use  °• Smoking status: Former Smoker  °  Packs/day: 2.00  °  Years: 40.00  °  Pack years: 80.00  °  Types: Cigarettes  °  Quit date: 08/29/1996  °  Years since quitting: 23.6  °• Smokeless tobacco: Never Used  °Vaping Use  °• Vaping Use: Never used  °Substance and Sexual Activity  °• Alcohol use: Yes  °  Alcohol/week: 8.0 standard drinks  °  Types: 8 Cans of beer per week  °  Comment: 2 beers every other day  °• Drug use: No  °• Sexual activity: Yes  °Other Topics Concern  °• Not on file  °Social History Narrative  ° Lives in [place] with wife, son, and son's family.  ° °Social Determinants of Health  ° °Financial Resource Strain:   °• Difficulty of Paying Living Expenses: Not on file  °Food Insecurity:   °• Worried About Running Out of Food in the Last Year: Not on file  °• Ran Out of Food in the Last Year: Not on file  °Transportation Needs:   °• Lack of Transportation (Medical): Not on  file  °• Lack of Transportation (Non-Medical): Not on file  °Physical Activity:   °• Days of Exercise per Week: Not   on file  °• Minutes of Exercise per Session: Not on file  °Stress:   °• Feeling of Stress : Not on file  °Social Connections:   °• Frequency of Communication with Friends and Family: Not on file  °• Frequency of Social Gatherings with Friends and Family: Not on file  °• Attends Religious Services: Not on file  °• Active Member of Clubs or Organizations: Not on file  °• Attends Club or Organization Meetings: Not on file  °• Marital Status: Not on file  °Intimate Partner Violence:   °• Fear of Current or Ex-Partner: Not on file  °• Emotionally Abused: Not on file  °• Physically Abused: Not on file  °• Sexually Abused: Not on file  ° ° °Review of Systems: Review of Systems  °Constitutional: Positive for malaise/fatigue. Negative for chills, fever and weight loss.  °HENT: Negative for sore throat.   °Eyes: Negative for pain and redness.  °Respiratory: Positive for cough and shortness of breath. Negative for stridor.   °Cardiovascular: Negative for chest pain and palpitations.  °Gastrointestinal: Negative for abdominal pain, blood in stool, constipation, diarrhea, heartburn, melena, nausea and vomiting.  °Genitourinary: Negative for flank pain and hematuria.  °Musculoskeletal: Negative for falls and myalgias.  °Skin: Negative for itching and rash.  °Neurological: Negative for seizures and loss of consciousness.  °Endo/Heme/Allergies: Negative for polydipsia. Does not bruise/bleed easily.  °Psychiatric/Behavioral: Negative for substance abuse. The patient is not nervous/anxious.   ° ° °Physical Exam: °Physical Exam °Constitutional:   °   General: He is not in acute distress. °   Appearance: He is obese.  °HENT:  °   Head: Normocephalic and atraumatic.  °   Nose: Nose normal.  °   Mouth/Throat:  °   Mouth: Mucous membranes are moist.  °   Pharynx: Oropharynx is clear.  °Eyes:  °   Extraocular Movements:  Extraocular movements intact.  °   Comments: Conjunctival pallor  °Cardiovascular:  °   Rate and Rhythm: Normal rate and regular rhythm.  °   Pulses: Normal pulses.  °   Heart sounds: Normal heart sounds.  °Pulmonary:  °   Effort: Pulmonary effort is normal. No respiratory distress.  °   Breath sounds: Normal breath sounds.  °Abdominal:  °   General: Bowel sounds are normal. There is no distension.  °   Palpations: Abdomen is soft. There is no mass.  °   Tenderness: There is no abdominal tenderness. There is no guarding or rebound.  °   Hernia: No hernia is present.  °Musculoskeletal:     °   General: No tenderness.  °   Cervical back: Normal range of motion and neck supple.  °   Right lower leg: Edema present.  °   Left lower leg: Edema present.  °Skin: °   General: Skin is warm and dry.  °Neurological:  °   General: No focal deficit present.  °   Mental Status: He is oriented to person, place, and time. He is lethargic.  °Psychiatric:     °   Mood and Affect: Mood normal.     °   Behavior: Behavior normal. Behavior is cooperative.  ° ° ° °Vital signs: °Vitals:  ° 04/16/20 0515 04/16/20 0641  °BP:  106/68  °Pulse: 73 71  °Resp: 14 16  °Temp:    °SpO2: 100% 100%  ° °  ° °GI:  °Lab Results: °Recent Labs  °  04/15/20 °1538 04/16/20 °0358  °  WBC 4.1 4.1  °HGB 5.9* 6.8*  °HCT 21.8* 25.4*  °PLT 137* PLATELET CLUMPS NOTED ON SMEAR, UNABLE TO ESTIMATE  ° °BMET °Recent Labs  °  04/15/20 °1538 04/16/20 °0358  °NA 136 137  °K 3.6 3.4*  °CL 100 103  °CO2 25 23  °GLUCOSE 178* 82  °BUN 27* 22  °CREATININE 1.18 1.10  °CALCIUM 8.3* 8.4*  ° °LFT °Recent Labs  °  04/15/20 °2102  °PROT 7.1  °ALBUMIN 3.6  °AST 23  °ALT 17  °ALKPHOS 94  °BILITOT 1.0  °BILIDIR 0.1  °IBILI 0.9  ° °PT/INR °Recent Labs  °  04/15/20 °2102  °LABPROT 14.2  °INR 1.1  ° ° ° °Studies/Results: °DG Chest 2 View ° °Result Date: 04/15/2020 °CLINICAL DATA:  74-year-old male with shortness of breath. EXAM: CHEST - 2 VIEW COMPARISON:  Chest radiograph dated 12/26/2019.  FINDINGS: There is mild cardiomegaly and mild vascular congestion and edema. Pneumonia is not excluded clinical correlation is recommended. No focal consolidation, pleural effusion, pneumothorax. Median sternotomy wires and CABG vascular clips. Atherosclerotic calcification of the aorta. No acute osseous pathology. IMPRESSION: Mild cardiomegaly with mild vascular congestion and edema. No focal consolidation. Electronically Signed   By: Arash  Radparvar M.D.   On: 04/15/2020 16:28  ° ° °Impression: °GI bleeding: symptomatic anemia, FOBT positive stools.  Most likely subacute bleeding from gastric AVMs.  Do not suspect variceal bleeding or portal hypertensive gastropathy. °-Hgb 5.9 on arrival 8/18, now 6.8 after 1 u pRBCs overnight; baseline Hgb 7.2 aas of 12/2019 °-BUN 22/ Cr 1.10, BUN decreased from 27 yesterday °-Iron deficiency: Ferritin 10; iron 12%, iron saturation 2%, TIBC elevated to 554 ° °Hepatic cirrhosis per imaging, compensated. °-LFTs normal: T. Bili 1.0/ AST 23/ ALT 17/ ALP 94 today °-INR 1.1 today °-Platelets 137 K/uL as of 8/18 ° °Plan: °There is no availability in the endoscopy unit today; however, patient is hemodynamically stable.  Thus, we will proceed with EGD tomorrow.  I thoroughly discussed the procedure with the patient to include nature, alternatives, benefits, and risks (including but not limited to bleeding, infection, perforation, anesthesia/cardiac and pulmonary complications).  Patient verbalized understanding gave verbal consent to proceed with EGD. ° °Clear liquid diet with n.p.o. after midnight. ° °Continue Protonix 40 mg IV twice daily. ° °Continue to monitor H&H with transfusion as needed to maintain hemoglobin around 7. ° °[facility] will follow. ° ° LOS: 0 days  ° °John Nolde Baron-Johnson  PA-C °04/16/2020, 8:58 AM ° °Contact #  [phone] °

## 2020-04-16 NOTE — Progress Notes (Signed)
PROGRESS NOTE    PATSY VARMA  YBO:[id number] DOB: 1946-05-22 DOA: 04/15/2020 PCP: [name], MD  Brief Narrative: CELESTER LECH is a 74 y.o. male with h/o CAD s/p CABG, HFpEF (EF 55-60%), T2DM, HTN, HLD, hepatic cirrhosis with portal gastropathy, history of GI bleed with previous gastric AVMs, iron deficiency anemia, GERD/Barrett's esophagus, and OSA intolerant to CPAP who presented to the ED for evaluation of dyspnea and lower extremity swelling x1 week. H/o occasional black stools. -Labs notable for hemoglobin 5.9 (recent baseline 7.2-7.5), FOBT is positive. chest x-ray -mild cardiomegaly and vascular congestion.  Given lasix and PRBC x1.   Assessment & Plan:   Severe Symptomatic anemia Melena/heme positive stools Anemia of chronic disease and iron deficiency: -Hemoglobin 5.9 on admission, FOBT positive. Last EGD 12/20/2017 by Dr. Watt Climes showed 5 nonbleeding angiectasia's in the stomach treated with APCs. -Transfused 1 unit PRBC, hb up to 6.8, will transfuse another unit of PRBC today -continue IV PPI -[facility] consulting, plan for EGD tomorrow -CBC Q12 -give IV Fe tomorrow  Acute on chronic diastolic CHF: Last EF 27-78% by TTE 07/2019.   -has severe edema, dyspnea on exertion and remains hypoxic at this time -continue IV lasix 40mg  q12 -Continue spironolactone -Monitor strict I/O's and daily weights  CAD s/p CABG: -stable, denies any chest pain.   -Continue atorvastatin.  No longer on aspirin due to bleeding risk.  Type II diabetes: -Hold home Metformin and Amaryl  -continue sensitive SSI while NPO.  Hypertension: Continue spironolactone.  Hepatic cirrhosis with portal gastropathy: Appears compensated.  Continue monitor. -h/o alcoholism and possibly NASH, likely culprits  Hyperlipidemia: Continue atorvastatin.  OSA: Not currently using CPAP due to intolerance.  DVT prophylaxis: SCDs Code Status: Full code Family Communication: Discussed with  patient, no family at bedside Disposition Plan:  Status is: Observation  The patient will require care spanning > 2 midnights and should be moved to inpatient because: Inpatient level of care appropriate due to severity of illness  Dispo: The patient is from: Home              Anticipated d/c is to: Home              Anticipated d/c date is: 2 days              Patient currently is not medically stable to d/c.  Consultants:   [facility]   Procedures:   Antimicrobials:    Subjective: -breathing a bit better still weak and SoB, denies chest pain, severe edema   Objective: Vitals:   04/16/20 0733 04/16/20 1001 04/16/20 1015 04/16/20 1031  BP: (!) 159/106 (!) 134/31 (!) 92/48 (!) 147/64  Pulse: 70 66 64 80  Resp: 15 17 18 20   Temp:      TempSrc:      SpO2: 98% 99% 100% 95%    Intake/Output Summary (Last 24 hours) at 04/16/2020 1046 Last data filed at 04/16/2020 0005 Gross per 24 hour  Intake 680 ml  Output 750 ml  Net -70 ml   There were no vitals filed for this visit.  Examination:  General exam: Obese, AAOx3, no distress Respiratory system: bilateral rales Cardiovascular system: S1 & S2 heard, RRR.   Gastrointestinal system: Abdomen is nondistended, soft and nontender.Normal bowel sounds heard. Central nervous system: Alert and oriented. No focal neurological deficits. Extremities: 3plus edema Skin: No rashes on exposed skin Psychiatry:  Mood & affect appropriate.     Data Reviewed:   CBC:  Recent Labs  Lab 04/15/20 1538 04/16/20 0358  WBC 4.1 4.1  HGB 5.9* 6.8*  HCT 21.8* 25.4*  MCV 78.4* 78.9*  PLT 137* PLATELET CLUMPS NOTED ON SMEAR, UNABLE TO ESTIMATE   Basic Metabolic Panel: Recent Labs  Lab 04/15/20 1538 04/16/20 0358  NA 136 137  K 3.6 3.4*  CL 100 103  CO2 25 23  GLUCOSE 178* 82  BUN 27* 22  CREATININE 1.18 1.10  CALCIUM 8.3* 8.4*  MG  --  2.1   GFR: CrCl cannot be calculated (Unknown ideal weight.). Liver Function Tests: Recent  Labs  Lab 04/15/20 2102  AST 23  ALT 17  ALKPHOS 94  BILITOT 1.0  PROT 7.1  ALBUMIN 3.6   No results for input(s): LIPASE, AMYLASE in the last 168 hours. No results for input(s): AMMONIA in the last 168 hours. Coagulation Profile: Recent Labs  Lab 04/15/20 2102  INR 1.1   Cardiac Enzymes: No results for input(s): CKTOTAL, CKMB, CKMBINDEX, TROPONINI in the last 168 hours. BNP (last 3 results) No results for input(s): PROBNP in the last 8760 hours. HbA1C: No results for input(s): HGBA1C in the last 72 hours. CBG: Recent Labs  Lab 04/16/20 0050 04/16/20 0346 04/16/20 0913  GLUCAP 84 86 90   Lipid Profile: No results for input(s): CHOL, HDL, LDLCALC, TRIG, CHOLHDL, LDLDIRECT in the last 72 hours. Thyroid Function Tests: No results for input(s): TSH, T4TOTAL, FREET4, T3FREE, THYROIDAB in the last 72 hours. Anemia Panel: Recent Labs    04/15/20 2130  VITAMINB12 235  FOLATE 22.0  FERRITIN 10*  TIBC 554*  IRON 12*   Urine analysis:    Component Value Date/Time   COLORURINE YELLOW 07/29/2019 1849   APPEARANCEUR CLEAR 07/29/2019 1849   LABSPEC 1.010 07/29/2019 1849   PHURINE 6.0 07/29/2019 1849   GLUCOSEU NEGATIVE 07/29/2019 1849   HGBUR NEGATIVE 07/29/2019 [place] 07/29/2019 [place] 07/29/2019 1849   PROTEINUR NEGATIVE 07/29/2019 1849   UROBILINOGEN 0.2 03/29/2014 1546   NITRITE NEGATIVE 07/29/2019 1849   LEUKOCYTESUR NEGATIVE 07/29/2019 1849   Sepsis Labs: @LABRCNTIP (procalcitonin:4,lacticidven:4)  ) Recent Results (from the past 240 hour(s))  SARS Coronavirus 2 by RT PCR (hospital order, performed in [place] hospital lab) Nasopharyngeal Nasopharyngeal Swab     Status: None   Collection Time: 04/15/20  9:02 PM   Specimen: Nasopharyngeal Swab  Result Value Ref Range Status   SARS Coronavirus 2 NEGATIVE NEGATIVE Final    Comment: (NOTE) SARS-CoV-2 target nucleic acids are NOT DETECTED.  The SARS-CoV-2 RNA is  generally detectable in upper and lower respiratory specimens during the acute phase of infection. The lowest concentration of SARS-CoV-2 viral copies this assay can detect is 250 copies / mL. A negative result does not preclude SARS-CoV-2 infection and should not be used as the sole basis for treatment or other patient management decisions.  A negative result may occur with improper specimen collection / handling, submission of specimen other than nasopharyngeal swab, presence of viral mutation(s) within the areas targeted by this assay, and inadequate number of viral copies (<250 copies / mL). A negative result must be combined with clinical observations, patient history, and epidemiological information.  Fact Sheet for Patients:   StrictlyIdeas.no  Fact Sheet for Healthcare Providers: BankingDealers.co.za  This test is not yet approved or  cleared by the [place] FDA and has been authorized for detection and/or diagnosis of SARS-CoV-2 by FDA under an Emergency Use Authorization (EUA).  This EUA  will remain in effect (meaning this test can be used) for the duration of the COVID-19 declaration under Section 564(b)(1) of the Act, 21 U.S.C. section 360bbb-3(b)(1), unless the authorization is terminated or revoked sooner.  Performed at [facility], [place] [address], [place], [place] [address]          Radiology Studies: DG Chest 2 View  Result Date: 04/15/2020 CLINICAL DATA:  74 year old male with shortness of breath. EXAM: CHEST - 2 VIEW COMPARISON:  Chest radiograph dated 12/26/2019. FINDINGS: There is mild cardiomegaly and mild vascular congestion and edema. Pneumonia is not excluded clinical correlation is recommended. No focal consolidation, pleural effusion, pneumothorax. Median sternotomy wires and CABG vascular clips. Atherosclerotic calcification of the aorta. No acute osseous pathology. IMPRESSION: Mild cardiomegaly  with mild vascular congestion and edema. No focal consolidation. Electronically Signed   By: Anner Crete M.D.   On: 04/15/2020 16:28        Scheduled Meds: . sodium chloride   Intravenous Once  . atorvastatin  40 mg Oral q1800  . furosemide  20 mg Intravenous Once  . furosemide  40 mg Intravenous Q12H  . gabapentin  100 mg Oral BID  . insulin aspart  0-9 Units Subcutaneous Q4H  . pantoprazole (PROTONIX) IV  40 mg Intravenous Q12H  . sodium chloride flush  3 mL Intravenous Q12H  . spironolactone  25 mg Oral Daily  . umeclidinium-vilanterol  1 puff Inhalation Daily   Continuous Infusions: . sodium chloride       LOS: 0 days    Time spent: 55min  Domenic Polite, MD [facility]  04/16/2020, 10:46 AM

## 2020-04-17 ENCOUNTER — Encounter (HOSPITAL_COMMUNITY)
Admission: EM | Disposition: A | Discharge status: Home / Self Care | Payer: Self-pay | Source: Home / Self Care | Attending: Internal Medicine

## 2020-04-17 ENCOUNTER — Inpatient Hospital Stay (HOSPITAL_COMMUNITY): Payer: Medicare HMO | Admitting: Anesthesiology

## 2020-04-17 ENCOUNTER — Encounter (HOSPITAL_COMMUNITY): Payer: Self-pay | Admitting: Internal Medicine

## 2020-04-17 HISTORY — PX: ESOPHAGOGASTRODUODENOSCOPY: SHX5428

## 2020-04-17 LAB — COMPREHENSIVE METABOLIC PANEL
ALT: 13 U/L (ref 0–44)
AST: 25 U/L (ref 15–41)
Albumin: 3.2 g/dL — ABNORMAL LOW (ref 3.5–5.0)
Alkaline Phosphatase: 90 U/L (ref 38–126)
Anion gap: 9 (ref 5–15)
BUN: 17 mg/dL (ref 8–23)
CO2: 25 mmol/L (ref 22–32)
Calcium: 8.3 mg/dL — ABNORMAL LOW (ref 8.9–10.3)
Chloride: 101 mmol/L (ref 98–111)
Creatinine, Ser: 1.19 mg/dL (ref 0.61–1.24)
GFR calc Af Amer: >60 mL/min (ref >60)
GFR calc non Af Amer: 60 mL/min — ABNORMAL LOW (ref >60)
Glucose, Bld: 98 mg/dL (ref 70–99)
Potassium: 3.3 mmol/L — ABNORMAL LOW (ref 3.5–5.1)
Sodium: 135 mmol/L (ref 135–145)
Total Bilirubin: 1.7 mg/dL — ABNORMAL HIGH (ref 0.3–1.2)
Total Protein: 6.1 g/dL — ABNORMAL LOW (ref 6.5–8.1)

## 2020-04-17 LAB — CBC
HCT: 23.8 % — ABNORMAL LOW (ref 39.0–52.0)
Hemoglobin: 6.8 g/dL — CL (ref 13.0–17.0)
MCH: 22.4 pg — ABNORMAL LOW (ref 26.0–34.0)
MCHC: 28.6 g/dL — ABNORMAL LOW (ref 30.0–36.0)
MCV: 78.3 fL — ABNORMAL LOW (ref 80.0–100.0)
Platelets: 134 10*3/uL — ABNORMAL LOW (ref 150–400)
RBC: 3.04 MIL/uL — ABNORMAL LOW (ref 4.22–5.81)
RDW: 19.5 % — ABNORMAL HIGH (ref 11.5–15.5)
WBC: 4.4 10*3/uL (ref 4.0–10.5)
nRBC: 0 % (ref 0.0–0.2)

## 2020-04-17 LAB — GLUCOSE, CAPILLARY
Glucose-Capillary: 109 mg/dL — ABNORMAL HIGH (ref 70–99)
Glucose-Capillary: 109 mg/dL — ABNORMAL HIGH (ref 70–99)
Glucose-Capillary: 117 mg/dL — ABNORMAL HIGH (ref 70–99)
Glucose-Capillary: 178 mg/dL — ABNORMAL HIGH (ref 70–99)
Glucose-Capillary: 97 mg/dL (ref 70–99)
Glucose-Capillary: 98 mg/dL (ref 70–99)

## 2020-04-17 LAB — PREPARE RBC (CROSSMATCH)

## 2020-04-17 SURGERY — EGD (ESOPHAGOGASTRODUODENOSCOPY)
Anesthesia: Monitor Anesthesia Care

## 2020-04-17 MED ORDER — ACCU-CHEK SOFTCLIX LANCETS MISC: 1.0000 | Freq: Four times a day (QID) | Status: DC

## 2020-04-17 MED ORDER — SODIUM CHLORIDE 0.9% IV SOLUTION: Freq: Once | INTRAVENOUS | Status: AC

## 2020-04-17 MED ORDER — PANTOPRAZOLE SODIUM 40 MG IV SOLR
40.0000 mg | INTRAVENOUS | Status: DC
  Administered 2020-04-18 – 2020-04-20 (×3): 40 mg via INTRAVENOUS
  Filled 2020-04-17 (×3): qty 40

## 2020-04-17 MED ORDER — ACETAMINOPHEN 325 MG PO TABS
650.0000 mg | ORAL_TABLET | Freq: Once | ORAL | Status: AC
  Administered 2020-04-17: 650 mg via ORAL
  Filled 2020-04-17: qty 2

## 2020-04-17 MED ORDER — FUROSEMIDE 10 MG/ML IJ SOLN
INTRAMUSCULAR | Status: AC
  Filled 2020-04-17: qty 4

## 2020-04-17 MED ORDER — FUROSEMIDE 10 MG/ML IJ SOLN
20.0000 mg | Freq: Once | INTRAMUSCULAR | Status: AC
  Administered 2020-04-17: 20 mg via INTRAVENOUS

## 2020-04-17 MED ORDER — TRAZODONE HCL 50 MG PO TABS
50.0000 mg | ORAL_TABLET | Freq: Every evening | ORAL | Status: DC | PRN
  Administered 2020-04-17: 50 mg via ORAL
  Filled 2020-04-17: qty 1

## 2020-04-17 MED ORDER — FERROUS SULFATE 325 (65 FE) MG PO TABS
325.0000 mg | ORAL_TABLET | Freq: Every day | ORAL | Status: DC
  Administered 2020-04-18 – 2020-04-23 (×6): 325 mg via ORAL
  Filled 2020-04-17 (×6): qty 1

## 2020-04-17 MED ORDER — DIPHENHYDRAMINE HCL 50 MG/ML IJ SOLN
25.0000 mg | Freq: Once | INTRAMUSCULAR | Status: AC
  Administered 2020-04-17: 25 mg via INTRAVENOUS
  Filled 2020-04-17: qty 1

## 2020-04-17 MED ORDER — NITROGLYCERIN 0.4 MG SL SUBL: 0.4000 mg | SUBLINGUAL_TABLET | SUBLINGUAL | Status: DC | PRN

## 2020-04-17 MED ORDER — LACTATED RINGERS IV SOLN: INTRAVENOUS | Status: DC | PRN

## 2020-04-17 MED ORDER — PROPOFOL 500 MG/50ML IV EMUL
INTRAVENOUS | Status: DC | PRN
  Administered 2020-04-17: 100 ug/kg/min via INTRAVENOUS

## 2020-04-17 MED ORDER — METFORMIN HCL 850 MG PO TABS
850.0000 mg | ORAL_TABLET | Freq: Two times a day (BID) | ORAL | Status: DC
  Administered 2020-04-18 – 2020-04-19 (×3): 850 mg via ORAL
  Filled 2020-04-17 (×5): qty 1

## 2020-04-17 MED ORDER — GLIMEPIRIDE 1 MG PO TABS
2.0000 mg | ORAL_TABLET | Freq: Every day | ORAL | Status: DC
  Administered 2020-04-18 – 2020-04-19 (×2): 2 mg via ORAL
  Filled 2020-04-17 (×2): qty 2

## 2020-04-17 MED ORDER — LACTATED RINGERS IV SOLN: INTRAVENOUS | Status: DC

## 2020-04-17 MED ORDER — SODIUM CHLORIDE 0.9 % IV SOLN
510.0000 mg | Freq: Once | INTRAVENOUS | Status: AC
  Administered 2020-04-17: 510 mg via INTRAVENOUS
  Filled 2020-04-17: qty 17

## 2020-04-17 MED ORDER — BLOOD GLUCOSE METER KIT: 1.0000 | PACK | Status: DC

## 2020-04-17 NOTE — Brief Op Note (Signed)
04/15/2020 - 04/17/2020  11:50 AM  PATIENT:  [name]  74 y.o. male  PRE-OPERATIVE DIAGNOSIS:  anemia, history of gastric AVMs, history of duodenal ulcer  POST-OPERATIVE DIAGNOSIS:  Gastric varices, portal HTN gastropathy  PROCEDURE:  Procedure(s): ESOPHAGOGASTRODUODENOSCOPY (EGD) (N/A)  SURGEON:  Surgeon(s) and Role:    Ronnette Juniper, MD - Primary  PHYSICIAN ASSISTANT:   ASSISTANTS: Maggie Lesly Dukes, Tech  ANESTHESIA:   MAC  EBL:  None  BLOOD ADMINISTERED:none  DRAINS: none   LOCAL MEDICATIONS USED:  NONE  SPECIMEN:  No Specimen  DISPOSITION OF SPECIMEN:  N/A  COUNTS:  YES  TOURNIQUET:  * No tourniquets in log *  DICTATION: .Dragon Dictation  PLAN OF CARE: Admit to inpatient   PATIENT DISPOSITION:  PACU - hemodynamically stable.   Delay start of Pharmacological VTE agent (>24hrs) due to surgical blood loss or risk of bleeding: not applicable

## 2020-04-17 NOTE — Anesthesia Postprocedure Evaluation (Signed)
Anesthesia Post Note  Patient: [name]  Procedure(s) Performed: ESOPHAGOGASTRODUODENOSCOPY (EGD) (N/A )     Patient location during evaluation: PACU Anesthesia Type: MAC Level of consciousness: awake and alert Pain management: pain level controlled Vital Signs Assessment: post-procedure vital signs reviewed and stable Respiratory status: spontaneous breathing, nonlabored ventilation, respiratory function stable and patient connected to nasal cannula oxygen Cardiovascular status: stable and blood pressure returned to baseline Postop Assessment: no apparent nausea or vomiting Anesthetic complications: no   No complications documented.  Last Vitals:  Vitals:   04/17/20 1200 04/17/20 1210  BP: (!) 119/40 (!) 106/33  Pulse: 73 70  Resp: 16 18  Temp:    SpO2: 99% 100%    Last Pain:  Vitals:   04/17/20 1148  TempSrc: Temporal  PainSc:                  [name] A.

## 2020-04-17 NOTE — Progress Notes (Addendum)
PROGRESS NOTE    [name]  LYY:[id number] DOB: 1946/08/18 DOA: 04/15/2020 PCP: John Koch, MD  Brief Narrative: [name] is a 74 y.o. male with h/o CAD s/p CABG, HFpEF (EF 55-60%), T2DM, HTN, HLD, hepatic cirrhosis with portal gastropathy, history of GI bleed with previous gastric AVMs, iron deficiency anemia, GERD/Barrett's esophagus, and OSA intolerant to CPAP who presented to the ED for evaluation of dyspnea and lower extremity swelling x1 week. H/o occasional black stools. -Labs notable for hemoglobin 5.9 (recent baseline 7.2-7.5), FOBT is positive. chest x-ray -mild cardiomegaly and vascular congestion.  -started on lasix and PRBC   Assessment & Plan:   Severe Symptomatic anemia Melena/heme positive stools Anemia of chronic disease and iron deficiency: -Hemoglobin 5.9 on admission, FOBT positive. Last EGD 12/20/2017 by Dr. Watt [name] showed 5 nonbleeding angiectasia's in the stomach treated with APCs. -Hb 6.8 this am, transfused 3 units PRBC so far -continue IV PPI -[facility] consulting, EGD this am with Gastric varices, no active bleeding, recommended IR consult for BRTO -monitor CBC Q12 -give IV Fe today   Acute on chronic diastolic CHF: Last EF 12-75% by TTE 07/2019.   -still remains volume overloaded -continue IV lasix 40mg  q12 -Continue spironolactone -Monitor strict I/O's and daily weights  CAD s/p CABG: -stable, denies any chest pain.   -Continue atorvastatin, holding ASA  Type II diabetes: -Hold home Metformin and Amaryl  -continue sensitive SSI while NPO.  Hypertension: Continue spironolactone.  Hepatic cirrhosis with portal gastropathy: Appears compensated.  Continue monitor. -h/o alcoholism and possibly NASH, likely culprits  Hyperlipidemia: Continue atorvastatin.  OSA: Not currently using CPAP due to intolerance.  DVT prophylaxis: SCDs Code Status: Full code Family Communication: Discussed with patient, no family at bedside,  left message for son [name], unable to reach wife Disposition Plan:  Status is: inpatient  Inpatient level of care appropriate due to severity of illness  Dispo: The patient is from: Home              Anticipated d/c is to: Home              Anticipated d/c date is: 2-3 days              Patient currently is not medically stable to d/c.  Consultants:   [name]   Procedures:   Antimicrobials:    Subjective: -still with some dyspnea, mild cough, no black stools last night  Objective: Vitals:   04/17/20 1007 04/17/20 1148 04/17/20 1200 04/17/20 1210  BP: (!) 170/70 (!) 120/29 (!) 119/40 (!) 106/33  Pulse: 75 72 73 70  Resp: (!) 21 18 16 18   Temp: 97.7 F (36.5 C) (!) 97.3 F (36.3 C)    TempSrc: Temporal Temporal    SpO2: 98% 99% 99% 100%  Weight:        Intake/Output Summary (Last 24 hours) at 04/17/2020 1410 Last data filed at 04/17/2020 1228 Gross per 24 hour  Intake 289.15 ml  Output 2775 ml  Net -2485.85 ml   Filed Weights   04/17/20 0359  Weight: 110.4 kg    Examination:  General exam: Obese pleasant male sitting up in bed, AAOx2, no distress HEENt: + JVD CVS: S1S2/RRR Lungs: fine basilar rales Abd: soft, obese, NT, BS present Ext: 2plus edema Skin: No rashes on exposed skin Psychiatry:  Mood & affect appropriate.     Data Reviewed:   CBC: Recent Labs  Lab 04/15/20 1538 04/16/20 0358 04/16/20 1944 04/17/20 1700  WBC 4.1 4.1 4.9 4.4  HGB 5.9* 6.8* 7.7* 6.8*  HCT 21.8* 25.4* 27.4* 23.8*  MCV 78.4* 78.9* 79.4* 78.3*  PLT 137* PLATELET CLUMPS NOTED ON SMEAR, UNABLE TO ESTIMATE 143* 191*   Basic Metabolic Panel: Recent Labs  Lab 04/15/20 1538 04/16/20 0358 04/17/20 0439  NA 136 137 135  K 3.6 3.4* 3.3*  CL 100 103 101  CO2 25 23 25   GLUCOSE 178* 82 98  BUN 27* 22 17  CREATININE 1.18 1.10 1.19  CALCIUM 8.3* 8.4* 8.3*  MG  --  2.1  --    GFR: Estimated Creatinine Clearance: 65.6 mL/min (by C-G formula based on SCr of 1.19  mg/dL). Liver Function Tests: Recent Labs  Lab 04/15/20 2102 04/17/20 0439  AST 23 25  ALT 17 13  ALKPHOS 94 90  BILITOT 1.0 1.7*  PROT 7.1 6.1*  ALBUMIN 3.6 3.2*   No results for input(s): LIPASE, AMYLASE in the last 168 hours. No results for input(s): AMMONIA in the last 168 hours. Coagulation Profile: Recent Labs  Lab 04/15/20 2102  INR 1.1   Cardiac Enzymes: No results for input(s): CKTOTAL, CKMB, CKMBINDEX, TROPONINI in the last 168 hours. BNP (last 3 results) No results for input(s): PROBNP in the last 8760 hours. HbA1C: No results for input(s): HGBA1C in the last 72 hours. CBG: Recent Labs  Lab 04/16/20 2030 04/17/20 0020 04/17/20 0354 04/17/20 0731 04/17/20 1015  GLUCAP 102* 109* 98 97 109*   Lipid Profile: No results for input(s): CHOL, HDL, LDLCALC, TRIG, CHOLHDL, LDLDIRECT in the last 72 hours. Thyroid Function Tests: No results for input(s): TSH, T4TOTAL, FREET4, T3FREE, THYROIDAB in the last 72 hours. Anemia Panel: Recent Labs    04/15/20 2130  VITAMINB12 235  FOLATE 22.0  FERRITIN 10*  TIBC 554*  IRON 12*   Urine analysis:    Component Value Date/Time   COLORURINE YELLOW 07/29/2019 1849   APPEARANCEUR CLEAR 07/29/2019 1849   LABSPEC 1.010 07/29/2019 1849   PHURINE 6.0 07/29/2019 1849   GLUCOSEU NEGATIVE 07/29/2019 1849   HGBUR NEGATIVE 07/29/2019 [place] 07/29/2019 [place] 07/29/2019 1849   PROTEINUR NEGATIVE 07/29/2019 1849   UROBILINOGEN 0.2 03/29/2014 1546   NITRITE NEGATIVE 07/29/2019 1849   LEUKOCYTESUR NEGATIVE 07/29/2019 1849   Sepsis Labs: @LABRCNTIP (procalcitonin:4,lacticidven:4)  ) Recent Results (from the past 240 hour(s))  SARS Coronavirus 2 by RT PCR (hospital order, performed in [place] hospital lab) Nasopharyngeal Nasopharyngeal Swab     Status: None   Collection Time: 04/15/20  9:02 PM   Specimen: Nasopharyngeal Swab  Result Value Ref Range Status   SARS Coronavirus 2  NEGATIVE NEGATIVE Final    Comment: (NOTE) SARS-CoV-2 target nucleic acids are NOT DETECTED.  The SARS-CoV-2 RNA is generally detectable in upper and lower respiratory specimens during the acute phase of infection. The lowest concentration of SARS-CoV-2 viral copies this assay can detect is 250 copies / mL. A negative result does not preclude SARS-CoV-2 infection and should not be used as the sole basis for treatment or other patient management decisions.  A negative result may occur with improper specimen collection / handling, submission of specimen other than nasopharyngeal swab, presence of viral mutation(s) within the areas targeted by this assay, and inadequate number of viral copies (<250 copies / mL). A negative result must be combined with clinical observations, patient history, and epidemiological information.  Fact Sheet for Patients:   StrictlyIdeas.no  Fact Sheet for Healthcare Providers: BankingDealers.co.za  This test is not yet approved or  cleared by the [place] and has been authorized for detection and/or diagnosis of SARS-CoV-2 by FDA under an Emergency Use Authorization (EUA).  This EUA will remain in effect (meaning this test can be used) for the duration of the COVID-19 declaration under Section 564(b)(1) of the Act, 21 U.S.C. section 360bbb-3(b)(1), unless the authorization is terminated or revoked sooner.  Performed at [facility], [place] [address] [place], [place] [address]          Radiology Studies: DG Chest 2 View  Result Date: 04/15/2020 CLINICAL DATA:  73 year old male with shortness of breath. EXAM: CHEST - 2 VIEW COMPARISON:  Chest radiograph dated 12/26/2019. FINDINGS: There is mild cardiomegaly and mild vascular congestion and edema. Pneumonia is not excluded clinical correlation is recommended. No focal consolidation, pleural effusion, pneumothorax. Median sternotomy wires and  CABG vascular clips. Atherosclerotic calcification of the aorta. No acute osseous pathology. IMPRESSION: Mild cardiomegaly with mild vascular congestion and edema. No focal consolidation. Electronically Signed   By: Anner Crete M.D.   On: 04/15/2020 16:28        Scheduled Meds: . atorvastatin  40 mg Oral q1800  . furosemide  40 mg Intravenous Q12H  . gabapentin  100 mg Oral BID  . insulin aspart  0-9 Units Subcutaneous Q4H  . pantoprazole (PROTONIX) IV  40 mg Intravenous Q12H  . sodium chloride flush  3 mL Intravenous Q12H  . spironolactone  25 mg Oral Daily  . umeclidinium-vilanterol  1 puff Inhalation Daily   Continuous Infusions: . sodium chloride       LOS: 1 day    Time spent: 51min  Domenic Polite, MD [facility]  04/17/2020, 2:10 PM

## 2020-04-17 NOTE — Interval H&P Note (Signed)
History and Physical Interval Note: 74/male with cirrhosis and history of gastric AVMs with anemia for an EGD.  04/17/2020 11:24 AM  [name]  has presented today for EGD, with the diagnosis of anemia, history of gastric AVMs, history of duodenal ulcer.  The various methods of treatment have been discussed with the patient and family. After consideration of risks, benefits and other options for treatment, the patient has consented to  Procedure(s): ESOPHAGOGASTRODUODENOSCOPY (EGD) (N/A) as a surgical intervention.  The patient's history has been reviewed, patient examined, no change in status, stable for surgery.  I have reviewed the patient's chart and labs.  Questions were answered to the patient's satisfaction.     Ronnette Juniper

## 2020-04-17 NOTE — Transfer of Care (Signed)
Immediate Anesthesia Transfer of Care Note  Patient: [name]  Procedure(s) Performed: ESOPHAGOGASTRODUODENOSCOPY (EGD) (N/A )  Patient Location: Endoscopy Unit  Anesthesia Type:MAC  Level of Consciousness: drowsy and patient cooperative  Airway & Oxygen Therapy: Patient Spontanous Breathing and Patient connected to nasal cannula oxygen  Post-op Assessment: Report given to RN, Post -op Vital signs reviewed and stable and Patient moving all extremities X 4  Post vital signs: Reviewed and stable  Last Vitals:  Vitals Value Taken Time  BP 120/29 04/17/20 1147  Temp    Pulse 75 04/17/20 1148  Resp 18 04/17/20 1148  SpO2 99 % 04/17/20 1148  Vitals shown include unvalidated device data.  Last Pain:  Vitals:   04/17/20 1007  TempSrc: Temporal  PainSc: 0-No pain      Patients Stated Pain Goal: 0 (00/86/76 1950)  Complications: No complications documented.

## 2020-04-17 NOTE — Op Note (Signed)
EGD was performed for symptomatic anemia.  Findings: Normal-appearing esophagus no evidence of esophageal varices. Normal-appearing Z-line. Gastric varices noted on retroflexion at the fundus, no evidence of recent or active bleeding. Mild portal hypertensive gastropathy. Normal-appearing duodenum and duodenal bulb.   Recommendations: IR consultation for consideration of BRTO of gastric varices.

## 2020-04-17 NOTE — Op Note (Signed)
[facility] Patient Name: [name] Procedure Date : 04/17/2020 MRN: [id number] Attending MD: Ronnette Juniper , MD Date of Birth: February 10, 1946 CSN: [id number] Age: 74 Admit Type: Inpatient Procedure:                Upper GI endoscopy Indications:              Unexplained iron deficiency anemia Providers:                Ronnette Juniper, MD, Josie Dixon, RN, Laverda Sorenson,                            Technician, Tyna Jaksch Technician, Phill Myron.                            Proofreader, CRNA Referring MD:             [facility] Medicines:                Monitored Anesthesia Care Complications:            No immediate complications. Estimated Blood Loss:     Estimated blood loss: none. Procedure:                Pre-Anesthesia Assessment:                           - Prior to the procedure, a History and Physical                            was performed, and patient medications and                            allergies were reviewed. The patient's tolerance of                            previous anesthesia was also reviewed. The risks                            and benefits of the procedure and the sedation                            options and risks were discussed with the patient.                            All questions were answered, and informed consent                            was obtained. Prior Anticoagulants: The patient has                            taken no previous anticoagulant or antiplatelet                            agents. ASA Grade Assessment: III - A patient with  severe systemic disease. After reviewing the risks                            and benefits, the patient was deemed in                            satisfactory condition to undergo the procedure.                           After obtaining informed consent, the endoscope was                            passed under direct vision. Throughout the                             procedure, the patient's blood pressure, pulse, and                            oxygen saturations were monitored continuously. The                            GIF-H190 ([id number]) Olympus gastroscope was                            introduced through the mouth, and advanced to the                            second part of duodenum. The upper GI endoscopy was                            accomplished without difficulty. The patient                            tolerated the procedure well. Scope In: Scope Out: Findings:      The examined esophagus was normal.      The Z-line was regular and was found 40 cm from the incisors.      Mild portal hypertensive gastropathy was found in the entire examined       stomach.      Gastric varices with no bleeding were found in the gastric fundus. There       were no stigmata of recent bleeding. They were medium in largest       diameter.      The examined duodenum was normal. Impression:               - Normal esophagus.                           - Z-line regular, 40 cm from the incisors.                           - Portal hypertensive gastropathy.                           - Gastric varices, without bleeding.                           -  Normal examined duodenum.                           - No specimens collected. Moderate Sedation:      Patient did not receive moderate sedation for this procedure, but       instead received monitored anesthesia care. Recommendation:           - Clear liquid diet.                           - IR consult for consideration for BRTO for gastric                            varices. Procedure Code(s):        --- Professional ---                           [phone], Esophagogastroduodenoscopy, flexible,                            transoral; diagnostic, including collection of                            specimen(s) by brushing or washing, when performed                            (separate procedure) Diagnosis Code(s):        ---  Professional ---                           K76.6, Portal hypertension                           K31.89, Other diseases of stomach and duodenum                           I86.4, Gastric varices                           D50.9, Iron deficiency anemia, unspecified CPT copyright 2019 American Medical Association. All rights reserved. The codes documented in this report are preliminary and upon coder review may  be revised to meet current compliance requirements. Ronnette Juniper, MD 04/17/2020 11:49:47 AM This report has been signed electronically. Number of Addenda: 0

## 2020-04-17 NOTE — Progress Notes (Signed)
CRITICAL VALUE ALERT  Critical Value: Hgb 6.8  Date & Time Notified:  04/17/20 5:30  Provider Notified: 04/17/20 5:35  Orders Received/Actions taken: 1 unit RBC ordered

## 2020-04-17 NOTE — Anesthesia Preprocedure Evaluation (Signed)
Anesthesia Evaluation  Patient identified by MRN, date of birth, ID band Patient awake    Reviewed: Allergy & Precautions, NPO status , Patient's Chart, lab work & pertinent test results, reviewed documented beta blocker date and time   Airway Mallampati: II  TM Distance: >3 FB Neck ROM: Full    Dental no notable dental hx. (+) Edentulous Upper, Edentulous Lower   Pulmonary shortness of breath and with exertion, asthma , sleep apnea and Continuous Positive Airway Pressure Ventilation , former smoker,    Pulmonary exam normal breath sounds clear to auscultation       Cardiovascular hypertension, Pt. on medications and Pt. on home beta blockers + CAD, + Cardiac Stents, + CABG and +CHF  Normal cardiovascular exam Rhythm:Regular Rate:Normal     Neuro/Psych negative neurological ROS  negative psych ROS   GI/Hepatic PUD, GERD  Medicated and Controlled,(+) Cirrhosis       , Anemia Hx/o GI AVM Hx/o duodenal ulcer   Endo/Other  diabetes, Well Controlled, Type 2, Oral Hypoglycemic AgentsObesity Hyperlipidemia  Renal/GU Renal diseaseHx/o renal calculi  negative genitourinary   Musculoskeletal  (+) Arthritis , Osteoarthritis,    Abdominal (+) + obese,   Peds  Hematology  (+) anemia , Thrombocytopenia   Anesthesia Other Findings   Reproductive/Obstetrics                             Anesthesia Physical Anesthesia Plan  ASA: III  Anesthesia Plan: MAC   Post-op Pain Management:    Induction: Intravenous  PONV Risk Score and Plan: 2 and Treatment may vary due to age or medical condition and Propofol infusion  Airway Management Planned: Natural Airway and Nasal Cannula  Additional Equipment:   Intra-op Plan:   Post-operative Plan:   Informed Consent: I have reviewed the patients History and Physical, chart, labs and discussed the procedure including the risks, benefits and alternatives  for the proposed anesthesia with the patient or authorized representative who has indicated his/her understanding and acceptance.     Dental advisory given  Plan Discussed with: CRNA and Anesthesiologist  Anesthesia Plan Comments:         Anesthesia Quick Evaluation

## 2020-04-18 LAB — TYPE AND SCREEN
ABO/RH(D): O POS
Antibody Screen: NEGATIVE
Unit division: 0
Unit division: 0
Unit division: 0

## 2020-04-18 LAB — CBC
HCT: 25.5 % — ABNORMAL LOW (ref 39.0–52.0)
HCT: 26.7 % — ABNORMAL LOW (ref 39.0–52.0)
Hemoglobin: 7.2 g/dL — ABNORMAL LOW (ref 13.0–17.0)
Hemoglobin: 7.6 g/dL — ABNORMAL LOW (ref 13.0–17.0)
MCH: 22.2 pg — ABNORMAL LOW (ref 26.0–34.0)
MCH: 22.5 pg — ABNORMAL LOW (ref 26.0–34.0)
MCHC: 28.2 g/dL — ABNORMAL LOW (ref 30.0–36.0)
MCHC: 28.5 g/dL — ABNORMAL LOW (ref 30.0–36.0)
MCV: 78.5 fL — ABNORMAL LOW (ref 80.0–100.0)
MCV: 79 fL — ABNORMAL LOW (ref 80.0–100.0)
Platelets: 115 10*3/uL — ABNORMAL LOW (ref 150–400)
Platelets: 126 10*3/uL — ABNORMAL LOW (ref 150–400)
RBC: 3.25 MIL/uL — ABNORMAL LOW (ref 4.22–5.81)
RBC: 3.38 MIL/uL — ABNORMAL LOW (ref 4.22–5.81)
RDW: 19.6 % — ABNORMAL HIGH (ref 11.5–15.5)
RDW: 19.8 % — ABNORMAL HIGH (ref 11.5–15.5)
WBC: 4.5 10*3/uL (ref 4.0–10.5)
WBC: 4.7 10*3/uL (ref 4.0–10.5)
nRBC: 0 % (ref 0.0–0.2)
nRBC: 0 % (ref 0.0–0.2)

## 2020-04-18 LAB — BPAM RBC
Blood Product Expiration Date: 202109192359
Blood Product Expiration Date: 202109192359
Blood Product Expiration Date: 202109212359
ISSUE DATE / TIME: 202108182127
ISSUE DATE / TIME: 202108191052
ISSUE DATE / TIME: 202108200727
Unit Type and Rh: 5100
Unit Type and Rh: 5100
Unit Type and Rh: 5100

## 2020-04-18 LAB — COMPREHENSIVE METABOLIC PANEL
ALT: 16 U/L (ref 0–44)
AST: 22 U/L (ref 15–41)
Albumin: 3.2 g/dL — ABNORMAL LOW (ref 3.5–5.0)
Alkaline Phosphatase: 94 U/L (ref 38–126)
Anion gap: 9 (ref 5–15)
BUN: 19 mg/dL (ref 8–23)
CO2: 27 mmol/L (ref 22–32)
Calcium: 8.5 mg/dL — ABNORMAL LOW (ref 8.9–10.3)
Chloride: 101 mmol/L (ref 98–111)
Creatinine, Ser: 1.36 mg/dL — ABNORMAL HIGH (ref 0.61–1.24)
GFR calc Af Amer: 59 mL/min — ABNORMAL LOW (ref >60)
GFR calc non Af Amer: 51 mL/min — ABNORMAL LOW (ref >60)
Glucose, Bld: 106 mg/dL — ABNORMAL HIGH (ref 70–99)
Potassium: 3.4 mmol/L — ABNORMAL LOW (ref 3.5–5.1)
Sodium: 137 mmol/L (ref 135–145)
Total Bilirubin: 1.5 mg/dL — ABNORMAL HIGH (ref 0.3–1.2)
Total Protein: 6.1 g/dL — ABNORMAL LOW (ref 6.5–8.1)

## 2020-04-18 LAB — GLUCOSE, CAPILLARY
Glucose-Capillary: 100 mg/dL — ABNORMAL HIGH (ref 70–99)
Glucose-Capillary: 101 mg/dL — ABNORMAL HIGH (ref 70–99)
Glucose-Capillary: 107 mg/dL — ABNORMAL HIGH (ref 70–99)
Glucose-Capillary: 120 mg/dL — ABNORMAL HIGH (ref 70–99)
Glucose-Capillary: 52 mg/dL — ABNORMAL LOW (ref 70–99)
Glucose-Capillary: 67 mg/dL — ABNORMAL LOW (ref 70–99)
Glucose-Capillary: 74 mg/dL (ref 70–99)
Glucose-Capillary: 79 mg/dL (ref 70–99)
Glucose-Capillary: 97 mg/dL (ref 70–99)

## 2020-04-18 LAB — HEMOGLOBIN AND HEMATOCRIT, BLOOD
HCT: 28.4 % — ABNORMAL LOW (ref 39.0–52.0)
Hemoglobin: 7.9 g/dL — ABNORMAL LOW (ref 13.0–17.0)

## 2020-04-18 MED ORDER — SENNOSIDES-DOCUSATE SODIUM 8.6-50 MG PO TABS
1.0000 | ORAL_TABLET | Freq: Two times a day (BID) | ORAL | Status: DC
  Administered 2020-04-18 – 2020-04-23 (×7): 1 via ORAL
  Filled 2020-04-18 (×10): qty 1

## 2020-04-18 MED ORDER — POLYETHYLENE GLYCOL 3350 17 G PO PACK
17.0000 g | PACK | Freq: Every day | ORAL | Status: DC
  Administered 2020-04-18 – 2020-04-22 (×3): 17 g via ORAL
  Filled 2020-04-18 (×5): qty 1

## 2020-04-18 NOTE — Progress Notes (Signed)
Hypoglycemic Event  CBG: 52 @ 21:12   Treatment: 8 oz OJ Symptoms: None  Follow-up CBG: @  21:44   CBG Result:74  Possible Reasons for Event: Liquid only diet  Comments/MD notified: on call provider notified @ 21:52; will continue to monitor    [employer]

## 2020-04-18 NOTE — Progress Notes (Signed)
PROGRESS NOTE    [name]  NFA:[id number] DOB: 09/25/1945 DOA: 04/15/2020 PCP: [name], MD  Brief Narrative: [name] is a 74 y.o. male with h/o CAD s/p CABG, HFpEF (EF 55-60%), T2DM, HTN, HLD, hepatic cirrhosis with portal gastropathy, history of GI bleed with previous gastric AVMs, iron deficiency anemia, GERD/Barrett's esophagus, and OSA intolerant to CPAP who presented to the ED for evaluation of dyspnea and lower extremity swelling x1 week. H/o occasional black stools. -Labs notable for hemoglobin 5.9 (recent baseline 7.2-7.5), FOBT is positive. chest x-ray -mild cardiomegaly and vascular congestion.  -started on lasix and PRBC   Assessment & Plan:   Severe Symptomatic anemia Melena/heme positive stools Anemia of chronic disease and iron deficiency: -Hemoglobin 5.9 on admission, FOBT positive. Last EGD 12/20/2017 by Dr. Watt Climes showed 5 nonbleeding angiectasia's in the stomach treated with APCs. -Hb 7.2 this am, transfused 3 units PRBC so far -continue IV PPI -[facility] consulting, EGD this am with Gastric varices, no active bleeding, recommended IR consult for BRTO,, radiology recommends CT abdomen pelvis with contrast prior to this, as creatinine is slightly elevated will hold diuretics today, a bit reluctant to add IV fluids as he is fluid overloaded, monitor BMP in a.m. if stable/improving would recommend proceeding with CT -given IV Fe 8/20 -CBC in a.m.   Acute on chronic diastolic CHF: Last EF 46-96% by TTE 07/2019.   -still remains volume overloaded, but volume status is improving -Hold Lasix and Aldactone today to allow for CT with contrast tomorrow  CAD s/p CABG: -stable, denies any chest pain.   -Continue atorvastatin, holding ASA  Type II diabetes: -Hold home Metformin and Amaryl  -continue sensitive SSI while NPO.  Hypertension: Stable, hold Aldactone  Hepatic cirrhosis with portal gastropathy: Appears compensated.  Continue  monitor. -h/o alcoholism and possibly NASH, likely culprits  Hyperlipidemia: Continue atorvastatin.  OSA: Not currently using CPAP due to intolerance.  DVT prophylaxis: SCDs Code Status: Full code Family Communication: Discussed with patient, no family at bedside, left message for son [name], unable to reach wife, will attempt again today Disposition Plan:  Status is: inpatient  Inpatient level of care appropriate due to severity of illness  Dispo: The patient is from: Home              Anticipated d/c is to: Home              Anticipated d/c date is: 2-3 days              Patient currently is not medically stable to d/c.  Consultants:   [facility]   Procedures:   Antimicrobials:    Subjective: Feels better today, breathing better, cough is improved  Objective: Vitals:   04/18/20 0421 04/18/20 0700 04/18/20 0836 04/18/20 0932  BP: 106/87 (!) 122/58 (!) 141/76   Pulse: 79 67    Resp: 18 19    Temp: 98.1 F (36.7 C) 97.9 F (36.6 C)    TempSrc: Oral Oral Oral   SpO2: 96%  93% 94%  Weight: 109.8 kg       Intake/Output Summary (Last 24 hours) at 04/18/2020 1153 Last data filed at 04/18/2020 0900 Gross per 24 hour  Intake 715.82 ml  Output 2477 ml  Net -1761.18 ml   Filed Weights   04/17/20 0359 04/18/20 0421  Weight: 110.4 kg 109.8 kg    Examination:  General exam: Obese pleasant elderly male sitting up in bed, AAO x2, no distress HEENT:  Positive JVD CVS: S1-S2, regular rate rhythm Lungs: Fine basilar rales otherwise clear Abdomen: Soft, obese, nontender, bowel sounds present Extremities: 1+ edema  Skin: No rashes on exposed skin Psychiatry:  Mood & affect appropriate.     Data Reviewed:   CBC: Recent Labs  Lab 04/15/20 1538 04/16/20 0358 04/16/20 1944 04/17/20 0439 04/18/20 0213  WBC 4.1 4.1 4.9 4.4 4.5  HGB 5.9* 6.8* 7.7* 6.8* 7.2*  HCT 21.8* 25.4* 27.4* 23.8* 25.5*  MCV 78.4* 78.9* 79.4* 78.3* 78.5*  PLT 137* PLATELET CLUMPS NOTED ON  SMEAR, UNABLE TO ESTIMATE 143* 134* 494*   Basic Metabolic Panel: Recent Labs  Lab 04/15/20 1538 04/16/20 0358 04/17/20 0439 04/18/20 0213  NA 136 137 135 137  K 3.6 3.4* 3.3* 3.4*  CL 100 103 101 101  CO2 25 23 25 27   GLUCOSE 178* 82 98 106*  BUN 27* 22 17 19   CREATININE 1.18 1.10 1.19 1.36*  CALCIUM 8.3* 8.4* 8.3* 8.5*  MG  --  2.1  --   --    GFR: Estimated Creatinine Clearance: 57.3 mL/min (A) (by C-G formula based on SCr of 1.36 mg/dL (H)). Liver Function Tests: Recent Labs  Lab 04/15/20 2102 04/17/20 0439 04/18/20 0213  AST 23 25 22   ALT 17 13 16   ALKPHOS 94 90 94  BILITOT 1.0 1.7* 1.5*  PROT 7.1 6.1* 6.1*  ALBUMIN 3.6 3.2* 3.2*   No results for input(s): LIPASE, AMYLASE in the last 168 hours. No results for input(s): AMMONIA in the last 168 hours. Coagulation Profile: Recent Labs  Lab 04/15/20 2102  INR 1.1   Cardiac Enzymes: No results for input(s): CKTOTAL, CKMB, CKMBINDEX, TROPONINI in the last 168 hours. BNP (last 3 results) No results for input(s): PROBNP in the last 8760 hours. HbA1C: No results for input(s): HGBA1C in the last 72 hours. CBG: Recent Labs  Lab 04/17/20 1622 04/17/20 2031 04/18/20 0044 04/18/20 0417 04/18/20 0813  GLUCAP 117* 178* 79 97 100*   Lipid Profile: No results for input(s): CHOL, HDL, LDLCALC, TRIG, CHOLHDL, LDLDIRECT in the last 72 hours. Thyroid Function Tests: No results for input(s): TSH, T4TOTAL, FREET4, T3FREE, THYROIDAB in the last 72 hours. Anemia Panel: Recent Labs    04/15/20 2130  VITAMINB12 235  FOLATE 22.0  FERRITIN 10*  TIBC 554*  IRON 12*   Urine analysis:    Component Value Date/Time   COLORURINE YELLOW 07/29/2019 1849   APPEARANCEUR CLEAR 07/29/2019 1849   LABSPEC 1.010 07/29/2019 1849   PHURINE 6.0 07/29/2019 1849   GLUCOSEU NEGATIVE 07/29/2019 1849   HGBUR NEGATIVE 07/29/2019 [place] 07/29/2019 [place] 07/29/2019 1849   PROTEINUR NEGATIVE  07/29/2019 1849   UROBILINOGEN 0.2 03/29/2014 1546   NITRITE NEGATIVE 07/29/2019 1849   LEUKOCYTESUR NEGATIVE 07/29/2019 1849   Sepsis Labs: @LABRCNTIP (procalcitonin:4,lacticidven:4)  ) Recent Results (from the past 240 hour(s))  SARS Coronavirus 2 by RT PCR (hospital order, performed in [place] hospital lab) Nasopharyngeal Nasopharyngeal Swab     Status: None   Collection Time: 04/15/20  9:02 PM   Specimen: Nasopharyngeal Swab  Result Value Ref Range Status   SARS Coronavirus 2 NEGATIVE NEGATIVE Final    Comment: (NOTE) SARS-CoV-2 target nucleic acids are NOT DETECTED.  The SARS-CoV-2 RNA is generally detectable in upper and lower respiratory specimens during the acute phase of infection. The lowest concentration of SARS-CoV-2 viral copies this assay can detect is 250 copies / mL. A negative result does not preclude  SARS-CoV-2 infection and should not be used as the sole basis for treatment or other patient management decisions.  A negative result may occur with improper specimen collection / handling, submission of specimen other than nasopharyngeal swab, presence of viral mutation(s) within the areas targeted by this assay, and inadequate number of viral copies (<250 copies / mL). A negative result must be combined with clinical observations, patient history, and epidemiological information.  Fact Sheet for Patients:   StrictlyIdeas.no  Fact Sheet for Healthcare Providers: BankingDealers.co.za  This test is not yet approved or  cleared by the [place] FDA and has been authorized for detection and/or diagnosis of SARS-CoV-2 by FDA under an Emergency Use Authorization (EUA).  This EUA will remain in effect (meaning this test can be used) for the duration of the COVID-19 declaration under Section 564(b)(1) of the Act, 21 U.S.C. section 360bbb-3(b)(1), unless the authorization is terminated or revoked sooner.  Performed  at [facility], [place] [address] [place], [place] [address]          Radiology Studies: No results found.      Scheduled Meds: . atorvastatin  40 mg Oral q1800  . ferrous sulfate  325 mg Oral Q breakfast  . gabapentin  100 mg Oral BID  . glimepiride  2 mg Oral Daily  . insulin aspart  0-9 Units Subcutaneous Q4H  . metFORMIN  850 mg Oral BID WC  . pantoprazole (PROTONIX) IV  40 mg Intravenous Q24H  . polyethylene glycol  17 g Oral Daily  . senna-docusate  1 tablet Oral BID  . sodium chloride flush  3 mL Intravenous Q12H  . umeclidinium-vilanterol  1 puff Inhalation Daily   Continuous Infusions: . sodium chloride 250 mL (04/17/20 1734)     LOS: 2 days    Time spent: 25min  Domenic Polite, MD [facility]  04/18/2020, 11:53 AM

## 2020-04-18 NOTE — Progress Notes (Signed)
Problems:  1.  Anemia with heme positive stool, prior history of gastric AVMs status post APC 2 years ago, EGD currently negative for AVMs  2.  Gastric varices seen on current endoscopy, being considered for B RTO  3.  Cirrhosis (?  Nonalcoholic: History of diabetes and obesity), characterized by CT findings of cirrhotic liver, splenomegaly, and mild thrombocytopenia but no ascites as of CT or ultrasound this past April  4.  Lower extremity edema  5.  Azotemia, current creatinine 1.36, gradually increasing  On exam, the patient has severe lower extremity edema and a very large abdomen, but unable to differentiate potential ascites from adiposity; the patient indicates his abdomen is not any bigger than is normal for him.  Plan: Follow labs, consider possible CT per IR request as prelude to possible B RTO.  No acute GI or liver issues requiring further immediate input, so we will follow at a distance.  Please call us at any time if earlier input is needed.  Cleotis Nipper, M.D. Pager [phone] If no answer or after 5 PM call [phone]

## 2020-04-18 NOTE — Progress Notes (Signed)
Patient ID: TRAYVION EMBLETON, male   DOB: 08-04-1946, 74 y.o.   MRN: [id number] Request received for possible BRTO of gastric varices in pt. Limited imaging studies were reviewed by Dr. Laurence Ferrari. He recommends CT A/P with IV contrast?BRTO protocol prior to determining whether pt is candidate for above procedure. Pt currently with creat of 1.36 so can either hydrate and monitor for drop in creat over next few days if stable or proceed with study knowing there may be some risk of worsening renal function. Dr. Laurence Ferrari may be reached on pager [phone] with any additional questions.

## 2020-04-19 ENCOUNTER — Encounter (HOSPITAL_COMMUNITY): Payer: Self-pay | Admitting: Gastroenterology

## 2020-04-19 LAB — BASIC METABOLIC PANEL
Anion gap: 9 (ref 5–15)
BUN: 16 mg/dL (ref 8–23)
CO2: 26 mmol/L (ref 22–32)
Calcium: 8.9 mg/dL (ref 8.9–10.3)
Chloride: 100 mmol/L (ref 98–111)
Creatinine, Ser: 1.26 mg/dL — ABNORMAL HIGH (ref 0.61–1.24)
GFR calc Af Amer: >60 mL/min (ref >60)
GFR calc non Af Amer: 56 mL/min — ABNORMAL LOW (ref >60)
Glucose, Bld: 89 mg/dL (ref 70–99)
Potassium: 3.8 mmol/L (ref 3.5–5.1)
Sodium: 135 mmol/L (ref 135–145)

## 2020-04-19 LAB — CBC
HCT: 26.6 % — ABNORMAL LOW (ref 39.0–52.0)
Hemoglobin: 7.6 g/dL — ABNORMAL LOW (ref 13.0–17.0)
MCH: 22.4 pg — ABNORMAL LOW (ref 26.0–34.0)
MCHC: 28.6 g/dL — ABNORMAL LOW (ref 30.0–36.0)
MCV: 78.2 fL — ABNORMAL LOW (ref 80.0–100.0)
Platelets: 122 10*3/uL — ABNORMAL LOW (ref 150–400)
RBC: 3.4 MIL/uL — ABNORMAL LOW (ref 4.22–5.81)
RDW: 19.8 % — ABNORMAL HIGH (ref 11.5–15.5)
WBC: 5 10*3/uL (ref 4.0–10.5)
nRBC: 0 % (ref 0.0–0.2)

## 2020-04-19 LAB — GLUCOSE, CAPILLARY
Glucose-Capillary: 103 mg/dL — ABNORMAL HIGH (ref 70–99)
Glucose-Capillary: 104 mg/dL — ABNORMAL HIGH (ref 70–99)
Glucose-Capillary: 121 mg/dL — ABNORMAL HIGH (ref 70–99)
Glucose-Capillary: 122 mg/dL — ABNORMAL HIGH (ref 70–99)
Glucose-Capillary: 123 mg/dL — ABNORMAL HIGH (ref 70–99)
Glucose-Capillary: 54 mg/dL — ABNORMAL LOW (ref 70–99)
Glucose-Capillary: 62 mg/dL — ABNORMAL LOW (ref 70–99)
Glucose-Capillary: 68 mg/dL — ABNORMAL LOW (ref 70–99)
Glucose-Capillary: 82 mg/dL (ref 70–99)
Glucose-Capillary: 97 mg/dL (ref 70–99)

## 2020-04-19 LAB — HEMOGLOBIN AND HEMATOCRIT, BLOOD
HCT: 26.3 % — ABNORMAL LOW (ref 39.0–52.0)
Hemoglobin: 7.2 g/dL — ABNORMAL LOW (ref 13.0–17.0)

## 2020-04-19 MED ORDER — SODIUM CHLORIDE 0.9 % IV SOLN: INTRAVENOUS | Status: AC

## 2020-04-19 NOTE — Progress Notes (Addendum)
Hypoglycemic Event  This is the 3rd hypoglycemic event within roughly the last 12 hours  CBG: 68 @ 4:26   Treatment: 4oz OJ and glucerna shake given  Symptoms: asymptomatic  Follow-up CBG: Time: 4:57 CBG Result: 97 Rechecked CBG: Time: 5:58    CBG Result: 122  Possible Reasons for Event: pt on liquid diet since admission on 8/18  Comments/MD notified: Will continue to monitor      [employer]

## 2020-04-19 NOTE — Progress Notes (Signed)
PROGRESS NOTE    [name]  CHE:[id number] DOB: Jan 15, 1946 DOA: 04/15/2020 PCP: [name], MD  Brief Narrative: [name] is a 74 y.o. male with h/o CAD s/p CABG, HFpEF (EF 55-60%), T2DM, HTN, HLD, hepatic cirrhosis with portal gastropathy, history of GI bleed with previous gastric AVMs, iron deficiency anemia, GERD/Barrett's esophagus, and OSA intolerant to CPAP who presented to the ED for evaluation of dyspnea and lower extremity swelling x1 week. H/o occasional black stools. -Labs notable for hemoglobin 5.9 (recent baseline 7.2-7.5), FOBT is positive. chest x-ray -mild cardiomegaly and vascular congestion.  -started on lasix and PRBC  Assessment & Plan:   Severe Symptomatic anemia Melena/heme positive stools Anemia of chronic disease and iron deficiency: -Hemoglobin 5.9 on admission, FOBT positive. Last EGD 12/20/2017 by Dr. Watt Climes showed 5 AVMs in the stomach treated with APCs. -Hb 7.2 this am, transfused 3 units PRBC so far -continue IV PPI -[facility] consulting, EGD 8/20 with Gastric varices, no active bleeding, recommended IR consult for BRTO,, radiology recommends CT abdomen pelvis with contrast prior to this, as creatinine is slightly elevated, holding diuretics, plan for CT w/ contrast per IR, will add gentle IVF today -given IV Fe 8/20 -CBC in a.m.   Acute on chronic diastolic CHF: Last EF 53-61% by TTE 07/2019.   -still remains volume overloaded, but volume status is improving -Hold Lasix and Aldactone  to allow for CT with contrast per IR for BRTO  CAD s/p CABG: -stable, denies any chest pain.   -Continue atorvastatin, holding ASA  Type II diabetes: -Hold home Metformin and Amaryl  -continue sensitive SSI while NPO.  Hypertension: Stable, hold Aldactone  Hepatic cirrhosis with portal gastropathy: Appears compensated.  Continue monitor. -h/o alcoholism and possibly NASH, likely culprits  Hyperlipidemia: Continue atorvastatin.  OSA: Not  currently using CPAP due to intolerance.  DVT prophylaxis: SCDs Code Status: Full code Family Communication: Discussed with patient, no family at bedside, left message for son [name] x2 days, updated wife 8/21 Disposition Plan:  Status is: inpatient  Inpatient level of care appropriate due to severity of illness  Dispo: The patient is from: Home              Anticipated d/c is to: Home              Anticipated d/c date is: 2-3 days              Patient currently is not medically stable to d/c.  Consultants:   [facility]   Procedures:   Antimicrobials:   Subjective: Feeling better overall, breathing is okay, reports some dark stools yesterday  Objective: Vitals:   04/18/20 1544 04/18/20 2030 04/19/20 0425 04/19/20 1033  BP: (!) 133/59 129/61  138/68  Pulse: 97 68 71   Resp: 17 18 18 16   Temp: 98.4 F (36.9 C) 98.5 F (36.9 C) 98.3 F (36.8 C) 98.1 F (36.7 C)  TempSrc: Oral Oral Oral Oral  SpO2:  94%  93%  Weight:   109.4 kg     Intake/Output Summary (Last 24 hours) at 04/19/2020 1351 Last data filed at 04/19/2020 1340 Gross per 24 hour  Intake 723 ml  Output 1275 ml  Net -552 ml   Filed Weights   04/17/20 0359 04/18/20 0421 04/19/20 0425  Weight: 110.4 kg 109.8 kg 109.4 kg    Examination:  General exam: Obese pleasant male sitting up in bed, AAOx3, no distress HEENT: Positive JVD CVS: S1-S2, regular rate rhythm  Lungs: Fine basilar rales, otherwise clear Abdomen: Soft, obese, nontender, bowel sounds present Extremities: 1+ edema, chronic venous stasis changes Skin: Hyperpigmentation on lower legs Psychiatry:  Mood & affect appropriate.     Data Reviewed:   CBC: Recent Labs  Lab 04/16/20 1944 04/16/20 1944 04/17/20 0439 04/17/20 0439 04/18/20 0213 04/18/20 1451 04/18/20 1943 04/19/20 0055 04/19/20 0753  WBC 4.9  --  4.4  --  4.5 4.7  --  5.0  --   HGB 7.7*   < > 6.8*   < > 7.2* 7.6* 7.9* 7.6* 7.2*  HCT 27.4*   < > 23.8*   < > 25.5* 26.7*  28.4* 26.6* 26.3*  MCV 79.4*  --  78.3*  --  78.5* 79.0*  --  78.2*  --   PLT 143*  --  134*  --  115* 126*  --  122*  --    < > = values in this interval not displayed.   Basic Metabolic Panel: Recent Labs  Lab 04/15/20 1538 04/16/20 0358 04/17/20 0439 04/18/20 0213 04/19/20 0055  NA 136 137 135 137 135  K 3.6 3.4* 3.3* 3.4* 3.8  CL 100 103 101 101 100  CO2 25 23 25 27 26   GLUCOSE 178* 82 98 106* 89  BUN 27* 22 17 19 16   CREATININE 1.18 1.10 1.19 1.36* 1.26*  CALCIUM 8.3* 8.4* 8.3* 8.5* 8.9  MG  --  2.1  --   --   --    GFR: Estimated Creatinine Clearance: 61.7 mL/min (A) (by C-G formula based on SCr of 1.26 mg/dL (H)). Liver Function Tests: Recent Labs  Lab 04/15/20 2102 04/17/20 0439 04/18/20 0213  AST 23 25 22   ALT 17 13 16   ALKPHOS 94 90 94  BILITOT 1.0 1.7* 1.5*  PROT 7.1 6.1* 6.1*  ALBUMIN 3.6 3.2* 3.2*   No results for input(s): LIPASE, AMYLASE in the last 168 hours. No results for input(s): AMMONIA in the last 168 hours. Coagulation Profile: Recent Labs  Lab 04/15/20 2102  INR 1.1   Cardiac Enzymes: No results for input(s): CKTOTAL, CKMB, CKMBINDEX, TROPONINI in the last 168 hours. BNP (last 3 results) No results for input(s): PROBNP in the last 8760 hours. HbA1C: No results for input(s): HGBA1C in the last 72 hours. CBG: Recent Labs  Lab 04/19/20 0426 04/19/20 0457 04/19/20 0558 04/19/20 0749 04/19/20 1149  GLUCAP 68* 97 122* 104* 103*   Lipid Profile: No results for input(s): CHOL, HDL, LDLCALC, TRIG, CHOLHDL, LDLDIRECT in the last 72 hours. Thyroid Function Tests: No results for input(s): TSH, T4TOTAL, FREET4, T3FREE, THYROIDAB in the last 72 hours. Anemia Panel: No results for input(s): VITAMINB12, FOLATE, FERRITIN, TIBC, IRON, RETICCTPCT in the last 72 hours. Urine analysis:    Component Value Date/Time   COLORURINE YELLOW 07/29/2019 1849   APPEARANCEUR CLEAR 07/29/2019 1849   LABSPEC 1.010 07/29/2019 1849   PHURINE 6.0  07/29/2019 1849   GLUCOSEU NEGATIVE 07/29/2019 1849   HGBUR NEGATIVE 07/29/2019 [place] 07/29/2019 [place] 07/29/2019 1849   PROTEINUR NEGATIVE 07/29/2019 1849   UROBILINOGEN 0.2 03/29/2014 1546   NITRITE NEGATIVE 07/29/2019 1849   LEUKOCYTESUR NEGATIVE 07/29/2019 1849   Sepsis Labs: @LABRCNTIP (procalcitonin:4,lacticidven:4)  ) Recent Results (from the past 240 hour(s))  SARS Coronavirus 2 by RT PCR (hospital order, performed in [place] hospital lab) Nasopharyngeal Nasopharyngeal Swab     Status: None   Collection Time: 04/15/20  9:02 PM   Specimen: Nasopharyngeal Swab  Result Value Ref Range Status   SARS Coronavirus 2 NEGATIVE NEGATIVE Final    Comment: (NOTE) SARS-CoV-2 target nucleic acids are NOT DETECTED.  The SARS-CoV-2 RNA is generally detectable in upper and lower respiratory specimens during the acute phase of infection. The lowest concentration of SARS-CoV-2 viral copies this assay can detect is 250 copies / mL. A negative result does not preclude SARS-CoV-2 infection and should not be used as the sole basis for treatment or other patient management decisions.  A negative result may occur with improper specimen collection / handling, submission of specimen other than nasopharyngeal swab, presence of viral mutation(s) within the areas targeted by this assay, and inadequate number of viral copies (<250 copies / mL). A negative result must be combined with clinical observations, patient history, and epidemiological information.  Fact Sheet for Patients:   StrictlyIdeas.no  Fact Sheet for Healthcare Providers: BankingDealers.co.za  This test is not yet approved or  cleared by the [place] FDA and has been authorized for detection and/or diagnosis of SARS-CoV-2 by FDA under an Emergency Use Authorization (EUA).  This EUA will remain in effect (meaning this test can be used)  for the duration of the COVID-19 declaration under Section 564(b)(1) of the Act, 21 U.S.C. section 360bbb-3(b)(1), unless the authorization is terminated or revoked sooner.  Performed at [facility], [place] [address] [place], [place] [address]          Radiology Studies: No results found.      Scheduled Meds: . atorvastatin  40 mg Oral q1800  . ferrous sulfate  325 mg Oral Q breakfast  . gabapentin  100 mg Oral BID  . insulin aspart  0-9 Units Subcutaneous Q4H  . pantoprazole (PROTONIX) IV  40 mg Intravenous Q24H  . polyethylene glycol  17 g Oral Daily  . senna-docusate  1 tablet Oral BID  . sodium chloride flush  3 mL Intravenous Q12H  . umeclidinium-vilanterol  1 puff Inhalation Daily   Continuous Infusions: . sodium chloride 250 mL (04/17/20 1734)     LOS: 3 days    Time spent: 71min  Domenic Polite, MD [facility]  04/19/2020, 1:51 PM

## 2020-04-20 ENCOUNTER — Inpatient Hospital Stay (HOSPITAL_COMMUNITY): Payer: Medicare HMO

## 2020-04-20 LAB — BASIC METABOLIC PANEL
Anion gap: 11 (ref 5–15)
BUN: 13 mg/dL (ref 8–23)
CO2: 25 mmol/L (ref 22–32)
Calcium: 9.4 mg/dL (ref 8.9–10.3)
Chloride: 101 mmol/L (ref 98–111)
Creatinine, Ser: 1.18 mg/dL (ref 0.61–1.24)
GFR calc Af Amer: >60 mL/min (ref >60)
GFR calc non Af Amer: >60 mL/min (ref >60)
Glucose, Bld: 80 mg/dL (ref 70–99)
Potassium: 4.3 mmol/L (ref 3.5–5.1)
Sodium: 137 mmol/L (ref 135–145)

## 2020-04-20 LAB — GLUCOSE, CAPILLARY
Glucose-Capillary: 119 mg/dL — ABNORMAL HIGH (ref 70–99)
Glucose-Capillary: 121 mg/dL — ABNORMAL HIGH (ref 70–99)
Glucose-Capillary: 121 mg/dL — ABNORMAL HIGH (ref 70–99)
Glucose-Capillary: 143 mg/dL — ABNORMAL HIGH (ref 70–99)
Glucose-Capillary: 73 mg/dL (ref 70–99)
Glucose-Capillary: 80 mg/dL (ref 70–99)

## 2020-04-20 LAB — CBC
HCT: 26.6 % — ABNORMAL LOW (ref 39.0–52.0)
Hemoglobin: 7.3 g/dL — ABNORMAL LOW (ref 13.0–17.0)
MCH: 22.3 pg — ABNORMAL LOW (ref 26.0–34.0)
MCHC: 27.4 g/dL — ABNORMAL LOW (ref 30.0–36.0)
MCV: 81.1 fL (ref 80.0–100.0)
Platelets: 126 10*3/uL — ABNORMAL LOW (ref 150–400)
RBC: 3.28 MIL/uL — ABNORMAL LOW (ref 4.22–5.81)
RDW: 20.4 % — ABNORMAL HIGH (ref 11.5–15.5)
WBC: 4.1 10*3/uL (ref 4.0–10.5)
nRBC: 0 % (ref 0.0–0.2)

## 2020-04-20 MED ORDER — INSULIN ASPART 100 UNIT/ML ~~LOC~~ SOLN
0.0000 [IU] | Freq: Every day | SUBCUTANEOUS | Status: DC
  Administered 2020-04-21: 2 [IU] via SUBCUTANEOUS

## 2020-04-20 MED ORDER — PANTOPRAZOLE SODIUM 40 MG PO TBEC
40.0000 mg | DELAYED_RELEASE_TABLET | Freq: Every day | ORAL | Status: DC
  Administered 2020-04-21 – 2020-04-23 (×3): 40 mg via ORAL
  Filled 2020-04-20 (×3): qty 1

## 2020-04-20 MED ORDER — IOHEXOL 350 MG/ML SOLN
100.0000 mL | Freq: Once | INTRAVENOUS | Status: AC | PRN
  Administered 2020-04-20: 100 mL via INTRAVENOUS

## 2020-04-20 MED ORDER — INSULIN ASPART 100 UNIT/ML ~~LOC~~ SOLN
0.0000 [IU] | Freq: Three times a day (TID) | SUBCUTANEOUS | Status: DC
  Administered 2020-04-21: 1 [IU] via SUBCUTANEOUS
  Administered 2020-04-21: 2 [IU] via SUBCUTANEOUS
  Administered 2020-04-22: 1 [IU] via SUBCUTANEOUS
  Administered 2020-04-22: 2 [IU] via SUBCUTANEOUS

## 2020-04-20 NOTE — Progress Notes (Signed)
PROGRESS NOTE    [name]  UXL:[id number] DOB: 03-13-1946 DOA: 04/15/2020 PCP: [name], MD  Brief Narrative: [name] is a 74 y.o. male with h/o CAD s/p CABG, HFpEF (EF 55-60%), T2DM, HTN, HLD, hepatic cirrhosis with portal gastropathy, history of GI bleed with previous gastric AVMs, iron deficiency anemia, GERD/Barrett's esophagus, and OSA intolerant to CPAP who presented to the ED for evaluation of dyspnea and lower extremity swelling x1 week. H/o occasional black stools. -Labs notable for hemoglobin 5.9 (recent baseline 7.2-7.5), FOBT is positive. chest x-ray -mild cardiomegaly and vascular congestion.  -started on lasix and PRBC  Assessment & Plan:   Severe Symptomatic anemia Melena/heme positive stools Anemia of chronic disease and iron deficiency: -Hemoglobin 5.9 on admission, FOBT positive. Last EGD 12/20/2017 by Dr. Watt Climes showed 5 AVMs in the stomach treated with APCs. -Hb 7.2 this am, transfused 3 units PRBC this admission -Continue PPI, changed to p.o. -[facility] consulting, EGD 8/20 with Gastric varices, no active bleeding, recommended IR consult for BRTO,, radiology recommends CT abdomen pelvis with contrast prior to this, as creatinine was slightly elevated he was gently hydrated yesterday and diuretics were held, creatinine is improved  -Will order CT abdomen with contrast for IR/BRTO protocol  -given IV Fe 8/20 -CBC in a.m.   Acute on chronic diastolic CHF: Last EF 53-66% by TTE 07/2019.   -still remains volume overloaded, but volume status is improving -Hold Lasix and Aldactone today given need for contrast with CT today  CAD s/p CABG: -stable, denies any chest pain.   -Continue atorvastatin, holding ASA  Type II diabetes: -Hold home Metformin and Amaryl  -continue sensitive SSI while NPO.  Hypertension: Stable, hold Aldactone  Hepatic cirrhosis with portal gastropathy: Appears compensated.  Continue monitor. -h/o alcoholism and  possibly NASH, likely culprits  Hyperlipidemia: Continue atorvastatin.  OSA: Not currently using CPAP due to intolerance.  DVT prophylaxis: SCDs Code Status: Full code Family Communication: Discussed with patient, no family at bedside, left message for son [name] x2 days, updated wife 8/21 Disposition Plan:  Status is: inpatient  Inpatient level of care appropriate due to severity of illness  Dispo: The patient is from: Home              Anticipated d/c is to: Home              Anticipated d/c date is: 2-3 days              Patient currently is not medically stable to d/c.  Consultants:   [facility]   Procedures:   Antimicrobials:   Subjective: -Feels okay, no events overnight, tells me his breathing is improving overall, denies any black stools  Objective: Vitals:   04/19/20 1649 04/19/20 1935 04/20/20 0527 04/20/20 0726  BP: (!) 144/66 120/65 (!) 114/58   Pulse: 66 61 70   Resp: 16 18    Temp: 98 F (36.7 C) (!) 97.5 F (36.4 C) 98 F (36.7 C)   TempSrc: Oral Oral Oral   SpO2: 94% 90% 96% 93%  Weight:   110.7 kg     Intake/Output Summary (Last 24 hours) at 04/20/2020 1501 Last data filed at 04/20/2020 0849 Gross per 24 hour  Intake 882.75 ml  Output 1050 ml  Net -167.25 ml   Filed Weights   04/18/20 0421 04/19/20 0425 04/20/20 0527  Weight: 109.8 kg 109.4 kg 110.7 kg    Examination:  General exam: Obese pleasant male sitting up in  bed, AAOx3, no distress HEENT: Positive JVD CVS: S1-S2, regular rate rhythm Lungs: Few basilar rales otherwise clear Abdomen: Soft, nontender, bowel sounds present Extremities: 1+ edema, chronic venous stasis changes  Skin: Hyperpigmentation on lower legs Psychiatry:  Mood & affect appropriate.     Data Reviewed:   CBC: Recent Labs  Lab 04/17/20 0439 04/17/20 0439 04/18/20 0213 04/18/20 0213 04/18/20 1451 04/18/20 1943 04/19/20 0055 04/19/20 0753 04/20/20 0617  WBC 4.4  --  4.5  --  4.7  --  5.0  --  4.1    HGB 6.8*   < > 7.2*   < > 7.6* 7.9* 7.6* 7.2* 7.3*  HCT 23.8*   < > 25.5*   < > 26.7* 28.4* 26.6* 26.3* 26.6*  MCV 78.3*  --  78.5*  --  79.0*  --  78.2*  --  81.1  PLT 134*  --  115*  --  126*  --  122*  --  126*   < > = values in this interval not displayed.   Basic Metabolic Panel: Recent Labs  Lab 04/16/20 0358 04/17/20 0439 04/18/20 0213 04/19/20 0055 04/20/20 0617  NA 137 135 137 135 137  K 3.4* 3.3* 3.4* 3.8 4.3  CL 103 101 101 100 101  CO2 23 25 27 26 25   GLUCOSE 82 98 106* 89 80  BUN 22 17 19 16 13   CREATININE 1.10 1.19 1.36* 1.26* 1.18  CALCIUM 8.4* 8.3* 8.5* 8.9 9.4  MG 2.1  --   --   --   --    GFR: Estimated Creatinine Clearance: 66.3 mL/min (by C-G formula based on SCr of 1.18 mg/dL). Liver Function Tests: Recent Labs  Lab 04/15/20 2102 04/17/20 0439 04/18/20 0213  AST 23 25 22   ALT 17 13 16   ALKPHOS 94 90 94  BILITOT 1.0 1.7* 1.5*  PROT 7.1 6.1* 6.1*  ALBUMIN 3.6 3.2* 3.2*   No results for input(s): LIPASE, AMYLASE in the last 168 hours. No results for input(s): AMMONIA in the last 168 hours. Coagulation Profile: Recent Labs  Lab 04/15/20 2102  INR 1.1   Cardiac Enzymes: No results for input(s): CKTOTAL, CKMB, CKMBINDEX, TROPONINI in the last 168 hours. BNP (last 3 results) No results for input(s): PROBNP in the last 8760 hours. HbA1C: No results for input(s): HGBA1C in the last 72 hours. CBG: Recent Labs  Lab 04/19/20 2026 04/20/20 0008 04/20/20 0531 04/20/20 0734 04/20/20 1158  GLUCAP 123* 80 73 121* 119*   Lipid Profile: No results for input(s): CHOL, HDL, LDLCALC, TRIG, CHOLHDL, LDLDIRECT in the last 72 hours. Thyroid Function Tests: No results for input(s): TSH, T4TOTAL, FREET4, T3FREE, THYROIDAB in the last 72 hours. Anemia Panel: No results for input(s): VITAMINB12, FOLATE, FERRITIN, TIBC, IRON, RETICCTPCT in the last 72 hours. Urine analysis:    Component Value Date/Time   COLORURINE YELLOW 07/29/2019 1849    APPEARANCEUR CLEAR 07/29/2019 1849   LABSPEC 1.010 07/29/2019 1849   PHURINE 6.0 07/29/2019 1849   GLUCOSEU NEGATIVE 07/29/2019 1849   HGBUR NEGATIVE 07/29/2019 [place] 07/29/2019 [place] 07/29/2019 1849   PROTEINUR NEGATIVE 07/29/2019 1849   UROBILINOGEN 0.2 03/29/2014 1546   NITRITE NEGATIVE 07/29/2019 1849   LEUKOCYTESUR NEGATIVE 07/29/2019 1849   Sepsis Labs: @LABRCNTIP (procalcitonin:4,lacticidven:4)  ) Recent Results (from the past 240 hour(s))  SARS Coronavirus 2 by RT PCR (hospital order, performed in [facility] hospital lab) Nasopharyngeal Nasopharyngeal Swab     Status: None  Collection Time: 04/15/20  9:02 PM   Specimen: Nasopharyngeal Swab  Result Value Ref Range Status   SARS Coronavirus 2 NEGATIVE NEGATIVE Final    Comment: (NOTE) SARS-CoV-2 target nucleic acids are NOT DETECTED.  The SARS-CoV-2 RNA is generally detectable in upper and lower respiratory specimens during the acute phase of infection. The lowest concentration of SARS-CoV-2 viral copies this assay can detect is 250 copies / mL. A negative result does not preclude SARS-CoV-2 infection and should not be used as the sole basis for treatment or other patient management decisions.  A negative result may occur with improper specimen collection / handling, submission of specimen other than nasopharyngeal swab, presence of viral mutation(s) within the areas targeted by this assay, and inadequate number of viral copies (<250 copies / mL). A negative result must be combined with clinical observations, patient history, and epidemiological information.  Fact Sheet for Patients:   StrictlyIdeas.no  Fact Sheet for Healthcare Providers: BankingDealers.co.za  This test is not yet approved or  cleared by the [place] FDA and has been authorized for detection and/or diagnosis of SARS-CoV-2 by FDA under an Emergency Use  Authorization (EUA).  This EUA will remain in effect (meaning this test can be used) for the duration of the COVID-19 declaration under Section 564(b)(1) of the Act, 21 U.S.C. section 360bbb-3(b)(1), unless the authorization is terminated or revoked sooner.  Performed at [facility], [place] [address] [place], [place] [address]          Radiology Studies: CT ABDOMEN PELVIS W CONTRAST  Result Date: 04/20/2020 CLINICAL DATA:  Gastric varices, BRTO protocol EXAM: CT ABDOMEN AND PELVIS WITH CONTRAST TECHNIQUE: Multidetector CT imaging of the abdomen and pelvis was performed using the standard protocol following bolus administration of intravenous contrast. CONTRAST:  184mL OMNIPAQUE IOHEXOL 350 MG/ML SOLN COMPARISON:  CT abdomen pelvis, 02/17/2014 FINDINGS: Lower chest: Moderate right, small left pleural effusions and associated atelectasis or consolidation. Hepatobiliary: Coarse, nodular contour of the liver. Hypoenhancing subcapsular lesion of the anterior liver dome, hepatic segment VII, measuring 4.0 x 3.1 cm (series 8, image 23). No gallstones, gallbladder wall thickening, or biliary dilatation. Pancreas: Unremarkable. No pancreatic ductal dilatation or surrounding inflammatory changes. Spleen: Splenomegaly, maximum coronal span 15.4 cm. Adrenals/Urinary Tract: Adrenal glands are unremarkable. Asymmetric atrophy of the right kidney. No hydronephrosis. Nonobstructive calculus in the anterior midportion of the right kidney. The left kidney is normal. Bladder is unremarkable. Stomach/Bowel: Stomach is within normal limits. Appendix is surgically absent. No evidence of bowel wall thickening, distention, or inflammatory changes. Sigmoid diverticulosis. Vascular/Lymphatic: Aortic atherosclerosis. Incidental note of variant anatomy of the celiac trunk, with the right gastric artery arising from the gastroduodenal artery. There is probable high-grade atherosclerotic stenosis of the origin of the  right renal artery (series 4, image 58). Small gastroesophageal and gastric fundus varices. No enlarged abdominal or pelvic lymph nodes. Reproductive: No mass or other significant abnormality. Other: No abdominal wall hernia or abnormality. No abdominopelvic ascites. Musculoskeletal: No acute or significant osseous findings. IMPRESSION: 1. No findings to localize GI bleeding. No intraluminal contrast identified. 2. Stigmata of cirrhosis and portal hypertension. 3. Small gastroesophageal and gastric fundus varices. 4. Hypoenhancing subcapsular lesion of the anterior liver dome, hepatic segment VII, measuring 4.0 x 3.1 cm. This lesion is new compared to CT dated 02/17/2014 and although not typical in contrast enhancement characteristics is nonetheless suspicious for hepatocellular carcinoma in the setting of cirrhosis. Recommend multiphasic contrast enhanced MRI to further characterize on a nonemergent  basis when clinically appropriate. 5. Moderate right, small left pleural effusions and associated atelectasis or consolidation. 6. Asymmetric atrophy of the right kidney, which is markedly progressed compared to prior examination dated 2015. Nonobstructive right nephrolithiasis. There is probable high-grade atherosclerotic stenosis of the origin of the right renal artery which likely explains renal atrophy. 7. Aortic Atherosclerosis (ICD10-I70.0). Electronically Signed   By: Eddie Candle M.D.   On: 04/20/2020 13:59        Scheduled Meds: . atorvastatin  40 mg Oral q1800  . ferrous sulfate  325 mg Oral Q breakfast  . gabapentin  100 mg Oral BID  . insulin aspart  0-9 Units Subcutaneous Q4H  . [START ON 04/21/2020] pantoprazole  40 mg Oral Daily  . polyethylene glycol  17 g Oral Daily  . senna-docusate  1 tablet Oral BID  . sodium chloride flush  3 mL Intravenous Q12H  . umeclidinium-vilanterol  1 puff Inhalation Daily   Continuous Infusions: . sodium chloride 250 mL (04/17/20 1734)     LOS: 4 days    Time spent: 73min  Domenic Polite, MD [facility]  04/20/2020, 3:01 PM

## 2020-04-20 NOTE — Progress Notes (Signed)
Dr. Broadus John called for pt wanting to eat more "eal food" . New order received for heart healthy carb modified diet .

## 2020-04-20 NOTE — Progress Notes (Signed)
Interventional Radiology Brief Note:  [name] a 74 y.o.malewith h/oCAD s/p CABG, HFpEF (EF 55-60%), T2DM, HTN, HLD, hepatic cirrhosis with portal gastropathy, history of GI bleed with previous gastric AVMs, iron deficiency anemia, GERD/Barrett's esophagus, and OSA intolerant to CPAP who presented to the ED for evaluation of dyspnea and lower extremity swelling x1 week. H/o occasional black stools. Found to have anemia, HgB 5.9 and heme positive stools.   S/p EGD 8/20 which showed gastric varices, no active bleeding.  IR consulted for possible BRTO due to varices with GI blood loss.   CT BRTO Protocol obtained this AM for evaluation of anatomy.  Per Dr. Vernard Gambles, moderate esophageal varices, small gastric varices.  Anatomy not amenable to BRTO. Also possible HCC based on newly identified lesion in the anterior liver dome.  No procedure planned in IR at this time.  Recommend MR Liver protocol for further assessment of possible HCC.   Primary team notified through [place].   Brynda Greathouse, MS RD PA-C 4:54 PM

## 2020-04-20 NOTE — Progress Notes (Signed)
[facility] Gastroenterology Progress Note  [name] 74 y.o. [birth year]  CC:  Cirrhosis, ascites  Subjective: Patient denies any current complaints.  Denies abdominal pain, nausea, vomiting.  States her stools are less dark than on admission.  ROS : Review of Systems  Cardiovascular: Negative for chest pain and palpitations.  Gastrointestinal: Negative for abdominal pain, blood in stool, constipation, diarrhea, heartburn, melena, nausea and vomiting.    Objective: Vital signs in last 24 hours: Vitals:   04/20/20 0527 04/20/20 0726  BP: (!) 114/58   Pulse: 70   Resp:    Temp: 98 F (36.7 C)   SpO2: 96% 93%    Physical Exam:  General:  Lethargic, obese, cooperative, no acute distress  Head:  Normocephalic, without obvious abnormality, atraumatic  Eyes:   Anicteric sclera, EOM's intact,   Lungs:   Clear to auscultation bilaterally, respirations unlabored  Heart:  Regular rate and rhythm, S1, S2 normal  Abdomen:   Soft, protuberant, non-tender, bowel sounds active all four quadrants,  no guarding or peritoneal signs  Extremities:  Bilateral lower extremity edema, TED hose in place  Pulses: 2+ and symmetric    Lab Results: Recent Labs    04/19/20 0055 04/20/20 0617  NA 135 137  K 3.8 4.3  CL 100 101  CO2 26 25  GLUCOSE 89 80  BUN 16 13  CREATININE 1.26* 1.18  CALCIUM 8.9 9.4   Recent Labs    04/18/20 0213  AST 22  ALT 16  ALKPHOS 94  BILITOT 1.5*  PROT 6.1*  ALBUMIN 3.2*   Recent Labs    04/19/20 0055 04/19/20 0055 04/19/20 0753 04/20/20 0617  WBC 5.0  --   --  4.1  HGB 7.6*   < > 7.2* 7.3*  HCT 26.6*   < > 26.3* 26.6*  MCV 78.2*  --   --  81.1  PLT 122*  --   --  126*   < > = values in this interval not displayed.   No results for input(s): LABPROT, INR in the last 72 hours.    Assessment: Iron-deficiency anemia:  -EGD 04/17/20: Mild portal hypertensive gastropathy was found in the entire examined stomach. Gastric varices with no bleeding  were found in the gastric fundus. There were no stigmata of recent bleeding. They were medium in largest diameter. -Hgb continues to gradually decrease; 7.3 today, stable as compared to 7.2 yesterday  Cirrhosis: -Mildly elevated T. Bili (1.5) but LFTs otherwise normal as of 8/21: AST 22/ ALT 16/ ALP 94 -INR 1.1 as of 8/18 -Platelets 126 K/uL today  Plan:  CT-A today followed by IR consultation for BRTO.  Continue Protonix 40 mg daily p.o.  Continue to monitor H&H with transfusion as needed to maintain Hgb >7.  Salley Slaughter PA-C 04/20/2020, 12:15 PM  Contact #  [phone]

## 2020-04-21 ENCOUNTER — Inpatient Hospital Stay (HOSPITAL_COMMUNITY): Payer: Medicare HMO

## 2020-04-21 LAB — CBC
HCT: 28.4 % — ABNORMAL LOW (ref 39.0–52.0)
Hemoglobin: 8 g/dL — ABNORMAL LOW (ref 13.0–17.0)
MCH: 23.2 pg — ABNORMAL LOW (ref 26.0–34.0)
MCHC: 28.2 g/dL — ABNORMAL LOW (ref 30.0–36.0)
MCV: 82.3 fL (ref 80.0–100.0)
Platelets: 140 10*3/uL — ABNORMAL LOW (ref 150–400)
RBC: 3.45 MIL/uL — ABNORMAL LOW (ref 4.22–5.81)
RDW: 21.8 % — ABNORMAL HIGH (ref 11.5–15.5)
WBC: 4.2 10*3/uL (ref 4.0–10.5)
nRBC: 0 % (ref 0.0–0.2)

## 2020-04-21 LAB — COMPREHENSIVE METABOLIC PANEL
ALT: 15 U/L (ref 0–44)
AST: 22 U/L (ref 15–41)
Albumin: 3.4 g/dL — ABNORMAL LOW (ref 3.5–5.0)
Alkaline Phosphatase: 115 U/L (ref 38–126)
Anion gap: 7 (ref 5–15)
BUN: 12 mg/dL (ref 8–23)
CO2: 25 mmol/L (ref 22–32)
Calcium: 9.1 mg/dL (ref 8.9–10.3)
Chloride: 104 mmol/L (ref 98–111)
Creatinine, Ser: 1.2 mg/dL (ref 0.61–1.24)
GFR calc Af Amer: >60 mL/min (ref >60)
GFR calc non Af Amer: 59 mL/min — ABNORMAL LOW (ref >60)
Glucose, Bld: 135 mg/dL — ABNORMAL HIGH (ref 70–99)
Potassium: 4.4 mmol/L (ref 3.5–5.1)
Sodium: 136 mmol/L (ref 135–145)
Total Bilirubin: 1.2 mg/dL (ref 0.3–1.2)
Total Protein: 6.7 g/dL (ref 6.5–8.1)

## 2020-04-21 LAB — GLUCOSE, CAPILLARY
Glucose-Capillary: 145 mg/dL — ABNORMAL HIGH (ref 70–99)
Glucose-Capillary: 164 mg/dL — ABNORMAL HIGH (ref 70–99)
Glucose-Capillary: 110 mg/dL — ABNORMAL HIGH (ref 70–99)
Glucose-Capillary: 214 mg/dL — ABNORMAL HIGH (ref 70–99)

## 2020-04-21 MED ORDER — GADOBUTROL 1 MMOL/ML IV SOLN: 10.0000 mL | Freq: Once | INTRAVENOUS | Status: DC | PRN

## 2020-04-21 NOTE — Progress Notes (Signed)
Subjective: Dark stools. Upset about CT findings.  Objective: Vital signs in last 24 hours: Temp:  [98.2 F (36.8 C)-98.8 F (37.1 C)] 98.2 F (36.8 C) (08/24 0900) Pulse Rate:  [58-75] 58 (08/24 0457) Resp:  [17-18] 18 (08/24 0457) BP: (91-144)/(46-63) 144/63 (08/24 0900) SpO2:  [92 %-98 %] 92 % (08/24 0900) Weight:  [109 kg] 109 kg (08/24 0457) Weight change: -1.678 kg Last BM Date: 04/20/20  PE: GEN:  NAD, upset ABD:  Protuberant, non-tender  Lab Results: CBC    Component Value Date/Time   WBC 4.2 04/21/2020 0840   RBC 3.45 (L) 04/21/2020 0840   HGB 8.0 (L) 04/21/2020 0840   HCT 28.4 (L) 04/21/2020 0840   PLT 140 (L) 04/21/2020 0840   MCV 82.3 04/21/2020 0840   MCH 23.2 (L) 04/21/2020 0840   MCHC 28.2 (L) 04/21/2020 0840   RDW 21.8 (H) 04/21/2020 0840   LYMPHSABS 0.6 (L) 01/03/2020 0518   MONOABS 0.3 01/03/2020 0518   EOSABS 0.1 01/03/2020 0518   BASOSABS 0.0 01/03/2020 0518   = CMP     Component Value Date/Time   NA 136 04/21/2020 0840   NA 140 01/28/2020 1348   K 4.4 04/21/2020 0840   CL 104 04/21/2020 0840   CO2 25 04/21/2020 0840   GLUCOSE 135 (H) 04/21/2020 0840   BUN 12 04/21/2020 0840   BUN 31 (H) 01/28/2020 1348   CREATININE 1.20 04/21/2020 0840   CREATININE 0.75 06/09/2015 1609   CALCIUM 9.1 04/21/2020 0840   PROT 6.7 04/21/2020 0840   ALBUMIN 3.4 (L) 04/21/2020 0840   AST 22 04/21/2020 0840   ALT 15 04/21/2020 0840   ALKPHOS 115 04/21/2020 0840   BILITOT 1.2 04/21/2020 0840   GFRNONAA 59 (L) 04/21/2020 0840   GFRAA >60 04/21/2020 0840    Assessment:  1.  Anemia, Hgb stable. 2.  Dark stools, on senokot (which can do this), no change Hgb. 3.  Cirrhosis with varices; unable to do BRTO 4.  Liver lesion on CT, possible hepatoma.  Plan:  1.  CEA, CA 19-9, AFP. 2.  MRI liver. 3.  Possible surgical consultation. 4.  If worsening Hgb or bleeding, consider colonoscopy but wouldn't do otherwise. 5.  [facility] will  follow.   [name] 04/21/2020, 10:24 AM   Cell [phone] If no answer or after 5 PM call [phone]

## 2020-04-21 NOTE — Progress Notes (Signed)
PROGRESS NOTE    [name]  BTD:[id number] DOB: 21-Dec-1945 DOA: 04/15/2020 PCP: John Koch, MD  Brief Narrative: [name] is a 74 y.o. male with h/o CAD s/p CABG, HFpEF (EF 55-60%), T2DM, HTN, HLD, hepatic cirrhosis with portal gastropathy, history of GI bleed with previous gastric AVMs, iron deficiency anemia, GERD/Barrett's esophagus, and OSA intolerant to CPAP who presented to the ED for evaluation of dyspnea and lower extremity swelling x1 week. H/o occasional black stools. -Labs notable for hemoglobin 5.9 (recent baseline 7.5), FOBT - positive. chest x-ray -mild cardiomegaly and vascular congestion.  -started on lasix and PRBC -See details below, EGD noted gastric varices, B RTO was recommended per IR -CT abdomen 8/23 for PRT of protocol noted liver lesion and not amenable to BRTO  Assessment & Plan:   Severe Symptomatic anemia Gastric avrices Melena/heme positive stools Anemia of chronic disease and iron deficiency: -Hemoglobin 5.9 on admission, FOBT positive. Last EGD 12/20/2017 noted gastric AVMs treated with APCs then. -Hb 8 this am, transfused 3 units PRBC this admission -Continue PPI, changed to p.o. -[facility] consulting, EGD 8/20 with Gastric varices, no active bleeding, recommended IR consult for BRTO,, radiology recommended CT abdomen pelvis with contrast per BRTO protocol, this was completed yesterday evening, unfortunately anatomy not amenable to BRTO and now notes liver lesion -given IV Fe 8/20, Hb stable -CBC in a.m.  Liver mass/lesion -noted on CT 8/23, will obtain MRI w/ contrast per protocol  -also FU AFP/CEA   Acute on chronic diastolic CHF: Last EF 36-46% by TTE 07/2019.   -still remains volume overloaded, but volume status is improving -Held Lasix and Aldactone yesterday due to need for contrast with CT -resume both tomorrow if creat stable  CAD s/p CABG: -stable, denies any chest pain.   -Continue atorvastatin, holding ASA  Type II  diabetes: -Hold home Metformin and Amaryl  -continue sensitive SSI while NPO.  Hypertension: Stable, hold Aldactone  Hepatic cirrhosis with portal gastropathy: Appears compensated.  Continue monitor. -h/o alcoholism and possibly NASH, likely culprits  Hyperlipidemia: Continue atorvastatin.  OSA: Not currently using CPAP due to intolerance.  DVT prophylaxis: SCDs Code Status: Full code Family Communication: Discussed with patient, no family at bedside, called and updated son [name] Disposition Plan:  Status is: inpatient  Inpatient level of care appropriate due to severity of illness  Dispo: The patient is from: Home              Anticipated d/c is to: Home              Anticipated d/c date is: 2-3 days              Patient currently is not medically stable to d/c.  Consultants:   [facility]   Procedures:   Antimicrobials:   Subjective: -a bit upset about not having procedure yesterday, mild dyspnea  Objective: Vitals:   04/21/20 0457 04/21/20 0855 04/21/20 0900 04/21/20 1300  BP: (!) 91/46  (!) 144/63 (!) 144/63  Pulse: (!) 58   67  Resp: 18     Temp: 98.8 F (37.1 C)  98.2 F (36.8 C) 98.1 F (36.7 C)  TempSrc: Oral  Oral Oral  SpO2:  95% 92% 98%  Weight: 109 kg       Intake/Output Summary (Last 24 hours) at 04/21/2020 1446 Last data filed at 04/21/2020 1000 Gross per 24 hour  Intake --  Output 2130 ml  Net -2130 ml   [employer]  04/19/20 0425 04/20/20 0527 04/21/20 0457  Weight: 109.4 kg 110.7 kg 109 kg    Examination:  General exam: Obese pleasant male sitting up in bed, AAOx3, visibly upset HEENT: Positive JVD CVS: S1-S2, regular rate rhythm Lungs: Few basilar rales, otherwise clear Abdomen: Soft, nontender, bowel sounds present Extremities: 1+ edema, chronic venous stasis changes Skin: Hyperpigmentation on lower legs Psychiatry:  Mood & affect appropriate.     Data Reviewed:   CBC: Recent Labs  Lab 04/18/20 0213  04/18/20 0213 04/18/20 1451 04/18/20 1451 04/18/20 1943 04/19/20 0055 04/19/20 0753 04/20/20 0617 04/21/20 0840  WBC 4.5  --  4.7  --   --  5.0  --  4.1 4.2  HGB 7.2*   < > 7.6*   < > 7.9* 7.6* 7.2* 7.3* 8.0*  HCT 25.5*   < > 26.7*   < > 28.4* 26.6* 26.3* 26.6* 28.4*  MCV 78.5*  --  79.0*  --   --  78.2*  --  81.1 82.3  PLT 115*  --  126*  --   --  122*  --  126* 140*   < > = values in this interval not displayed.   Basic Metabolic Panel: Recent Labs  Lab 04/16/20 0358 04/16/20 0358 04/17/20 0439 04/18/20 0213 04/19/20 0055 04/20/20 0617 04/21/20 0840  NA 137   < > 135 137 135 137 136  K 3.4*   < > 3.3* 3.4* 3.8 4.3 4.4  CL 103   < > 101 101 100 101 104  CO2 23   < > 25 27 26 25 25   GLUCOSE 82   < > 98 106* 89 80 135*  BUN 22   < > 17 19 16 13 12   CREATININE 1.10   < > 1.19 1.36* 1.26* 1.18 1.20  CALCIUM 8.4*   < > 8.3* 8.5* 8.9 9.4 9.1  MG 2.1  --   --   --   --   --   --    < > = values in this interval not displayed.   GFR: Estimated Creatinine Clearance: 64.6 mL/min (by C-G formula based on SCr of 1.2 mg/dL). Liver Function Tests: Recent Labs  Lab 04/15/20 2102 04/17/20 0439 04/18/20 0213 04/21/20 0840  AST 23 25 22 22   ALT 17 13 16 15   ALKPHOS 94 90 94 115  BILITOT 1.0 1.7* 1.5* 1.2  PROT 7.1 6.1* 6.1* 6.7  ALBUMIN 3.6 3.2* 3.2* 3.4*   No results for input(s): LIPASE, AMYLASE in the last 168 hours. No results for input(s): AMMONIA in the last 168 hours. Coagulation Profile: Recent Labs  Lab 04/15/20 2102  INR 1.1   Cardiac Enzymes: No results for input(s): CKTOTAL, CKMB, CKMBINDEX, TROPONINI in the last 168 hours. BNP (last 3 results) No results for input(s): PROBNP in the last 8760 hours. HbA1C: No results for input(s): HGBA1C in the last 72 hours. CBG: Recent Labs  Lab 04/20/20 1158 04/20/20 1653 04/20/20 2048 04/21/20 0820 04/21/20 1119  GLUCAP 119* 143* 121* 145* 164*   Lipid Profile: No results for input(s): CHOL, HDL, LDLCALC,  TRIG, CHOLHDL, LDLDIRECT in the last 72 hours. Thyroid Function Tests: No results for input(s): TSH, T4TOTAL, FREET4, T3FREE, THYROIDAB in the last 72 hours. Anemia Panel: No results for input(s): VITAMINB12, FOLATE, FERRITIN, TIBC, IRON, RETICCTPCT in the last 72 hours. Urine analysis:    Component Value Date/Time   COLORURINE YELLOW 07/29/2019 1849   APPEARANCEUR CLEAR 07/29/2019 1849   LABSPEC 1.010 07/29/2019 1849  PHURINE 6.0 07/29/2019 1849   GLUCOSEU NEGATIVE 07/29/2019 [place] 07/29/2019 [place] 07/29/2019 [place] 07/29/2019 [place] 07/29/2019 1849   UROBILINOGEN 0.2 03/29/2014 1546   NITRITE NEGATIVE 07/29/2019 1849   LEUKOCYTESUR NEGATIVE 07/29/2019 1849   Sepsis Labs: @LABRCNTIP (procalcitonin:4,lacticidven:4)  ) Recent Results (from the past 240 hour(s))  SARS Coronavirus 2 by RT PCR (hospital order, performed in [facility] hospital lab) Nasopharyngeal Nasopharyngeal Swab     Status: None   Collection Time: 04/15/20  9:02 PM   Specimen: Nasopharyngeal Swab  Result Value Ref Range Status   SARS Coronavirus 2 NEGATIVE NEGATIVE Final    Comment: (NOTE) SARS-CoV-2 target nucleic acids are NOT DETECTED.  The SARS-CoV-2 RNA is generally detectable in upper and lower respiratory specimens during the acute phase of infection. The lowest concentration of SARS-CoV-2 viral copies this assay can detect is 250 copies / mL. A negative result does not preclude SARS-CoV-2 infection and should not be used as the sole basis for treatment or other patient management decisions.  A negative result may occur with improper specimen collection / handling, submission of specimen other than nasopharyngeal swab, presence of viral mutation(s) within the areas targeted by this assay, and inadequate number of viral copies (<250 copies / mL). A negative result must be combined with clinical observations, patient history,  and epidemiological information.  Fact Sheet for Patients:   StrictlyIdeas.no  Fact Sheet for Healthcare Providers: BankingDealers.co.za  This test is not yet approved or  cleared by the [place] FDA and has been authorized for detection and/or diagnosis of SARS-CoV-2 by FDA under an Emergency Use Authorization (EUA).  This EUA will remain in effect (meaning this test can be used) for the duration of the COVID-19 declaration under Section 564(b)(1) of the Act, 21 U.S.C. section 360bbb-3(b)(1), unless the authorization is terminated or revoked sooner.  Performed at [facility], [place] [address] [place], [place] [address]     Radiology Studies: CT ABDOMEN PELVIS W CONTRAST  Result Date: 04/20/2020 CLINICAL DATA:  Gastric varices, BRTO protocol EXAM: CT ABDOMEN AND PELVIS WITH CONTRAST TECHNIQUE: Multidetector CT imaging of the abdomen and pelvis was performed using the standard protocol following bolus administration of intravenous contrast. CONTRAST:  160mL OMNIPAQUE IOHEXOL 350 MG/ML SOLN COMPARISON:  CT abdomen pelvis, 02/17/2014 FINDINGS: Lower chest: Moderate right, small left pleural effusions and associated atelectasis or consolidation. Hepatobiliary: Coarse, nodular contour of the liver. Hypoenhancing subcapsular lesion of the anterior liver dome, hepatic segment VII, measuring 4.0 x 3.1 cm (series 8, image 23). No gallstones, gallbladder wall thickening, or biliary dilatation. Pancreas: Unremarkable. No pancreatic ductal dilatation or surrounding inflammatory changes. Spleen: Splenomegaly, maximum coronal span 15.4 cm. Adrenals/Urinary Tract: Adrenal glands are unremarkable. Asymmetric atrophy of the right kidney. No hydronephrosis. Nonobstructive calculus in the anterior midportion of the right kidney. The left kidney is normal. Bladder is unremarkable. Stomach/Bowel: Stomach is within normal limits. Appendix is surgically  absent. No evidence of bowel wall thickening, distention, or inflammatory changes. Sigmoid diverticulosis. Vascular/Lymphatic: Aortic atherosclerosis. Incidental note of variant anatomy of the celiac trunk, with the right gastric artery arising from the gastroduodenal artery. There is probable high-grade atherosclerotic stenosis of the origin of the right renal artery (series 4, image 58). Small gastroesophageal and gastric fundus varices. No enlarged abdominal or pelvic lymph nodes. Reproductive: No mass or other significant abnormality. Other: No abdominal wall hernia or abnormality. No abdominopelvic ascites. Musculoskeletal: No  acute or significant osseous findings. IMPRESSION: 1. No findings to localize GI bleeding. No intraluminal contrast identified. 2. Stigmata of cirrhosis and portal hypertension. 3. Small gastroesophageal and gastric fundus varices. 4. Hypoenhancing subcapsular lesion of the anterior liver dome, hepatic segment VII, measuring 4.0 x 3.1 cm. This lesion is new compared to CT dated 02/17/2014 and although not typical in contrast enhancement characteristics is nonetheless suspicious for hepatocellular carcinoma in the setting of cirrhosis. Recommend multiphasic contrast enhanced MRI to further characterize on a nonemergent basis when clinically appropriate. 5. Moderate right, small left pleural effusions and associated atelectasis or consolidation. 6. Asymmetric atrophy of the right kidney, which is markedly progressed compared to prior examination dated 2015. Nonobstructive right nephrolithiasis. There is probable high-grade atherosclerotic stenosis of the origin of the right renal artery which likely explains renal atrophy. 7. Aortic Atherosclerosis (ICD10-I70.0). Electronically Signed   By: Eddie Candle M.D.   On: 04/20/2020 13:59   Scheduled Meds: . atorvastatin  40 mg Oral q1800  . ferrous sulfate  325 mg Oral Q breakfast  . gabapentin  100 mg Oral BID  . insulin aspart  0-5 Units  Subcutaneous QHS  . insulin aspart  0-9 Units Subcutaneous TID WC  . pantoprazole  40 mg Oral Daily  . polyethylene glycol  17 g Oral Daily  . senna-docusate  1 tablet Oral BID  . sodium chloride flush  3 mL Intravenous Q12H  . umeclidinium-vilanterol  1 puff Inhalation Daily   Continuous Infusions: . sodium chloride 250 mL (04/17/20 1734)     LOS: 5 days   Time spent: 64min  Domenic Polite, MD [facility]  04/21/2020, 2:46 PM

## 2020-04-22 DIAGNOSIS — R160 Hepatomegaly, not elsewhere classified: Secondary | ICD-10-CM

## 2020-04-22 DIAGNOSIS — I5033 Acute on chronic diastolic (congestive) heart failure: Secondary | ICD-10-CM

## 2020-04-22 DIAGNOSIS — K7460 Unspecified cirrhosis of liver: Secondary | ICD-10-CM

## 2020-04-22 DIAGNOSIS — E118 Type 2 diabetes mellitus with unspecified complications: Secondary | ICD-10-CM

## 2020-04-22 DIAGNOSIS — G4733 Obstructive sleep apnea (adult) (pediatric): Secondary | ICD-10-CM

## 2020-04-22 DIAGNOSIS — I2510 Atherosclerotic heart disease of native coronary artery without angina pectoris: Secondary | ICD-10-CM

## 2020-04-22 LAB — BASIC METABOLIC PANEL
Anion gap: 11 (ref 5–15)
BUN: 16 mg/dL (ref 8–23)
CO2: 23 mmol/L (ref 22–32)
Calcium: 9.1 mg/dL (ref 8.9–10.3)
Chloride: 103 mmol/L (ref 98–111)
Creatinine, Ser: 1.27 mg/dL — ABNORMAL HIGH (ref 0.61–1.24)
GFR calc Af Amer: >60 mL/min (ref >60)
GFR calc non Af Amer: 55 mL/min — ABNORMAL LOW (ref >60)
Glucose, Bld: 190 mg/dL — ABNORMAL HIGH (ref 70–99)
Potassium: 4.4 mmol/L (ref 3.5–5.1)
Sodium: 137 mmol/L (ref 135–145)

## 2020-04-22 LAB — CBC
HCT: 31 % — ABNORMAL LOW (ref 39.0–52.0)
Hemoglobin: 8.8 g/dL — ABNORMAL LOW (ref 13.0–17.0)
MCH: 23.8 pg — ABNORMAL LOW (ref 26.0–34.0)
MCHC: 28.4 g/dL — ABNORMAL LOW (ref 30.0–36.0)
MCV: 84 fL (ref 80.0–100.0)
Platelets: 152 10*3/uL (ref 150–400)
RBC: 3.69 MIL/uL — ABNORMAL LOW (ref 4.22–5.81)
RDW: 22.9 % — ABNORMAL HIGH (ref 11.5–15.5)
WBC: 4.9 10*3/uL (ref 4.0–10.5)
nRBC: 0 % (ref 0.0–0.2)

## 2020-04-22 LAB — GLUCOSE, CAPILLARY
Glucose-Capillary: 110 mg/dL — ABNORMAL HIGH (ref 70–99)
Glucose-Capillary: 153 mg/dL — ABNORMAL HIGH (ref 70–99)
Glucose-Capillary: 145 mg/dL — ABNORMAL HIGH (ref 70–99)
Glucose-Capillary: 126 mg/dL — ABNORMAL HIGH (ref 70–99)

## 2020-04-22 LAB — CEA: CEA: 2.2 ng/mL (ref 0.0–4.7)

## 2020-04-22 LAB — CANCER ANTIGEN 19-9: CA 19-9: 18 U/mL (ref 0–35)

## 2020-04-22 LAB — AFP TUMOR MARKER: AFP, Serum, Tumor Marker: 1.3 ng/mL (ref 0.0–8.3)

## 2020-04-22 MED ORDER — SPIRONOLACTONE 25 MG PO TABS
25.0000 mg | ORAL_TABLET | Freq: Every day | ORAL | Status: DC
  Administered 2020-04-22 – 2020-04-23 (×2): 25 mg via ORAL
  Filled 2020-04-22 (×2): qty 1

## 2020-04-22 MED ORDER — TORSEMIDE 20 MG PO TABS
20.0000 mg | ORAL_TABLET | ORAL | Status: DC
  Administered 2020-04-22: 20 mg via ORAL
  Filled 2020-04-22: qty 1

## 2020-04-22 MED ORDER — TORSEMIDE 20 MG PO TABS: 40.0000 mg | ORAL_TABLET | ORAL | Status: DC

## 2020-04-22 NOTE — Progress Notes (Signed)
[facility] Progress Note  [name] 74 y.o. Oct 09, 1945  CC: Cirrhosis, IDA, suspected HCC   Subjective: Patient denies any complaints.  Had a bowel movement this morning.  He states it was black, though he remains on Senekot.  Denies any abdominal pain, nausea, vomiting.  ROS : Review of Systems  Cardiovascular: Negative for chest pain and palpitations.  Gastrointestinal: Positive for melena. Negative for abdominal pain, blood in stool, constipation, diarrhea, heartburn, nausea and vomiting.    Objective: Vital signs in last 24 hours: Vitals:   04/21/20 2023 04/22/20 0634  BP: (!) 129/54 136/60  Pulse: 70 (!) 52  Resp: 18 18  Temp: 98.1 F (36.7 C) 97.6 F (36.4 C)  SpO2: 95% 96%    Physical Exam:  General:  Alert, oriented, cooperative, no distress, obese  Head:  Normocephalic, without obvious abnormality, atraumatic  Eyes:  , EOM's intact,   Lungs:   Clear to auscultation bilaterally, respirations unlabored  Heart:  Regular rate and rhythm, S1, S2 normal  Abdomen:   Soft, non-tender, bowel sounds active all four quadrants,  no masses,   Extremities: Extremities normal, atraumatic, no  edema  Pulses: 2+ and symmetric    Lab Results: Recent Labs    04/20/20 0617 04/21/20 0840  NA 137 136  K 4.3 4.4  CL 101 104  CO2 25 25  GLUCOSE 80 135*  BUN 13 12  CREATININE 1.18 1.20  CALCIUM 9.4 9.1   Recent Labs    04/21/20 0840  AST 22  ALT 15  ALKPHOS 115  BILITOT 1.2  PROT 6.7  ALBUMIN 3.4*   Recent Labs    04/20/20 0617 04/21/20 0840  WBC 4.1 4.2  HGB 7.3* 8.0*  HCT 26.6* 28.4*  MCV 81.1 82.3  PLT 126* 140*   No results for input(s): LABPROT, INR in the last 72 hours.    Assessment: Iron-deficiency anemia, improving -EGD 04/17/20: Mild portal hypertensive gastropathy was found in the entire examined stomach. Gastric varices with no bleeding were found in the gastric fundus. There were no stigmata of recent bleeding. They were  medium in largest diameter. -Hgb now 8.0, improved from 7.3 yesterday -Continues to have dark/black bowel movements, though he is on Senokot  Suspected hepatocellular carcinoma as seen on imaging -MRI yesterday revealed a lesion within the anterolateral dome of liver segment 7/8 measuring 3.1 x 3.6 cm. Technically incompletely characterized without IV contrast. However, based upon the enhancement characteristics from CT dated 04/20/2020 and review of more remote imaging including abdominal sonogram from 12/19/2017 and CT AP from 02/17/2014 findings are compatible with a LR-4 lesion, probably hepatocellular carcinoma.  Exam limited by movement/patient did not hold his breath at appropriate times.   -AFP, CEA, CA 19-9 all within normal limits  Cirrhosis:  -LFTs normal as of 8/24: T bili 1.2/AST 22/ALT 15/ALP 115 -INR 1.1 as of 8/18 -Platelets 140 K/uL as of 8/24  Plan: Recommend IR guided biopsy of liver lesion, as MRI was limited by movement and lack of IV contrast, and tumor markers are negative.  Continue Protonix 40 mg p.o. daily.  Continue to monitor H&H with transfusion as needed to maintain hemoglobin greater than 7.  [facility] will follow.  Salley Slaughter PA-C 04/22/2020, 8:54 AM  Contact #  [phone]

## 2020-04-22 NOTE — Progress Notes (Signed)
PROGRESS NOTE  [name] HYI:[id number] DOB: 1946/07/04 DOA: 04/15/2020 PCP: [name], MD   LOS: 6 days   Brief narrative: As per HPI,  [name] a 74 y.o.malewith h/oCAD s/p CABG, HFpEF (EF 55-60%), T2DM, HTN, HLD, hepatic cirrhosis with portal gastropathy, history of GI bleed with previous gastric AVMs, iron deficiency anemia, GERD/Barrett's esophagus, and OSA intolerant to CPAP who presented to the ED for evaluation of dyspnea and lower extremity swelling x1 week. H/o occasional black stools. -Labs notable for hemoglobin 5.9 (recent baseline 7.5), FOBT - positive. chest x-ray -mild cardiomegaly and vascular congestion.  -started on lasix and PRBC -See details below, EGD noted gastric varices, B RTO was recommended per IR -CT abdomen 8/23 for PRT of protocol noted liver lesion and not amenable to BRTO  Assessment/Plan:  Principal Problem:   Symptomatic anemia Active Problems:   Type 2 diabetes with complication (HCC)   OSA (obstructive sleep apnea)   Coronary artery disease involving native coronary artery of native heart without angina pectoris   Hyperlipidemia with target LDL less than 70   Hepatic cirrhosis (HCC)   Acute on chronic diastolic CHF (congestive heart failure) (HCC)   Severe symptomatic anemia on presentation secondary to melena and heme positive stool.  Cirrhosis of liver with gastric varices.  FOBT was positive.  Hemoglobin was 5.9 on admission.Last EGD 12/20/2017 noted gastric AVMs treated with APCs then.  Status post 3 units of packed RBC transfusion.  Hemoglobin of 8.8 today.  Continue p.o. Protonix.  GI on board and patient underwent EGD 8/20 with Gastric varices, no active bleeding.  GI recommended IR consult for balloon occluded retrograde transvenous obliteration (BRTO) for gastric varices and refractory ascites. Patient underwent CT abdomen  with contrast which showed liver lesion.  MRI without contrast was performed which is concerning  for [facility] at this time.  IR to follow the patient as outpatient.  GI recommends outpatient follow-up in the clinic after ablation treatment of liver lesion by IR.  Liver mass/lesion Noted on CT of the abdomen on 8/23, MRI of the abdomen without contrast was performed with possibility of hepatocellular carcinoma as per interventional radiology.  Alpha-fetoprotein, CEA/ CA 19-9 was unremarkable.  As per conversation with GI by interventional radiology, plan is outpatient follow-up for this liver mass and treat with ablation for possible hepatocellular cancer.   Acute on chronic diastolic CHF: Last known  EF 55-60% by TTE 07/2019. Peripheral edema but no dyspnea.  Lasix and Aldactone on hold due to need for contrast.  Will resume.  Creatinine of 1.2.  CAD s/p CABG: -No acute issues.  Continue atorvastatin, aspirin on hold.  Type II diabetes mellitus: Continue to hold home Metformin and Amaryl.  Continue sliding scale insulin, Accu-Cheks diabetic diet.  Essential hypertension: Aldactone on hold at this time.  Blood pressure stable at this time.  Will resume Aldactone.  Hepatic cirrhosis with portal gastropathy, reactive ascites. Likely secondary to alcohol use disorder and possibly NASH.  Compensated at this time.  Will resume spironolactone and Lasix.  Hyperlipidemia: Continue atorvastatin.  OSA: Not on CPAP due to intolerance.  DVT prophylaxis: SCDs Start: 04/15/20 2212   Code Status: Full code  Family Communication: None today   Status is: Inpatient  Remains inpatient appropriate because:IV treatments appropriate due to intensity of illness or inability to take PO and Inpatient level of care appropriate due to severity of illness   Dispo: The patient is from: Home  Anticipated d/c is to: Home              Anticipated d/c date is: 1 day, IR to discuss with the patient and formulate plan of care.              Patient currently is not medically stable to  d/c. Consultants:  GI  Interventional radiology  Procedures:  None  Antibiotics:  . None  Anti-infectives (From admission, onward)   None     Subjective: Today, patient was seen and examined at bedside.  Patient still complains of black stool yesterday.  No nausea vomiting or abdominal pain.  Objective: Vitals:   04/21/20 2023 04/22/20 0634  BP: (!) 129/54 136/60  Pulse: 70 (!) 52  Resp: 18 18  Temp: 98.1 F (36.7 C) 97.6 F (36.4 C)  SpO2: 95% 96%    Intake/Output Summary (Last 24 hours) at 04/22/2020 1426 Last data filed at 04/22/2020 0003 Gross per 24 hour  Intake 240 ml  Output 300 ml  Net -60 ml   Filed Weights   04/20/20 0527 04/21/20 0457 04/22/20 0634  Weight: 110.7 kg 109 kg 110.1 kg   Body mass index is 36.9 kg/m.   Physical Exam: GENERAL: Patient is alert awake and oriented. Not in obvious distress.  Obese HENT: Mild pallor noted. Pupils equally reactive to light. Oral mucosa is moist NECK: is supple, no gross swelling noted. CHEST: Clear to auscultation. No crackles or wheezes.  Diminished breath sounds bilaterally. CVS: S1 and S2 heard, no murmur. Regular rate and rhythm.  ABDOMEN: Soft, non-tender, bowel sounds are present.  Mildly distended abdomen  EXTREMITIES: Bilateral lower extremity edema noted CNS: Cranial nerves are intact. No focal motor deficits. SKIN: warm and dry without rashes.  Data Review: I have personally reviewed the following laboratory data and studies,  CBC: Recent Labs  Lab 04/18/20 1451 04/18/20 1943 04/19/20 0055 04/19/20 0753 04/20/20 0617 04/21/20 0840 04/22/20 0953  WBC 4.7  --  5.0  --  4.1 4.2 4.9  HGB 7.6*   < > 7.6* 7.2* 7.3* 8.0* 8.8*  HCT 26.7*   < > 26.6* 26.3* 26.6* 28.4* 31.0*  MCV 79.0*  --  78.2*  --  81.1 82.3 84.0  PLT 126*  --  122*  --  126* 140* 152   < > = values in this interval not displayed.   Basic Metabolic Panel: Recent Labs  Lab 04/16/20 0358 04/17/20 0439 04/18/20 0213  04/19/20 0055 04/20/20 0617 04/21/20 0840 04/22/20 0953  NA 137   < > 137 135 137 136 137  K 3.4*   < > 3.4* 3.8 4.3 4.4 4.4  CL 103   < > 101 100 101 104 103  CO2 23   < > 27 26 25 25 23   GLUCOSE 82   < > 106* 89 80 135* 190*  BUN 22   < > 19 16 13 12 16   CREATININE 1.10   < > 1.36* 1.26* 1.18 1.20 1.27*  CALCIUM 8.4*   < > 8.5* 8.9 9.4 9.1 9.1  MG 2.1  --   --   --   --   --   --    < > = values in this interval not displayed.   Liver Function Tests: Recent Labs  Lab 04/15/20 2102 04/17/20 0439 04/18/20 0213 04/21/20 0840  AST 23 25 22 22   ALT 17 13 16 15   ALKPHOS 94 90 94 115  BILITOT 1.0 1.7* 1.5*  1.2  PROT 7.1 6.1* 6.1* 6.7  ALBUMIN 3.6 3.2* 3.2* 3.4*   No results for input(s): LIPASE, AMYLASE in the last 168 hours. No results for input(s): AMMONIA in the last 168 hours. Cardiac Enzymes: No results for input(s): CKTOTAL, CKMB, CKMBINDEX, TROPONINI in the last 168 hours. BNP (last 3 results) Recent Labs    10/31/19 2035 12/26/19 2012 04/15/20 1539  BNP 181.0* 266.2* 293.0*    ProBNP (last 3 results) No results for input(s): PROBNP in the last 8760 hours.  CBG: Recent Labs  Lab 04/21/20 1119 04/21/20 1649 04/21/20 2105 04/22/20 0722 04/22/20 1146  GLUCAP 164* 110* 214* 110* 153*   Recent Results (from the past 240 hour(s))  SARS Coronavirus 2 by RT PCR (hospital order, performed in [facility] hospital lab) Nasopharyngeal Nasopharyngeal Swab     Status: None   Collection Time: 04/15/20  9:02 PM   Specimen: Nasopharyngeal Swab  Result Value Ref Range Status   SARS Coronavirus 2 NEGATIVE NEGATIVE Final    Comment: (NOTE) SARS-CoV-2 target nucleic acids are NOT DETECTED.  The SARS-CoV-2 RNA is generally detectable in upper and lower respiratory specimens during the acute phase of infection. The lowest concentration of SARS-CoV-2 viral copies this assay can detect is 250 copies / mL. A negative result does not preclude SARS-CoV-2 infection and  should not be used as the sole basis for treatment or other patient management decisions.  A negative result may occur with improper specimen collection / handling, submission of specimen other than nasopharyngeal swab, presence of viral mutation(s) within the areas targeted by this assay, and inadequate number of viral copies (<250 copies / mL). A negative result must be combined with clinical observations, patient history, and epidemiological information.  Fact Sheet for Patients:   StrictlyIdeas.no  Fact Sheet for Healthcare Providers: BankingDealers.co.za  This test is not yet approved or  cleared by the [place] FDA and has been authorized for detection and/or diagnosis of SARS-CoV-2 by FDA under an Emergency Use Authorization (EUA).  This EUA will remain in effect (meaning this test can be used) for the duration of the COVID-19 declaration under Section 564(b)(1) of the Act, 21 U.S.C. section 360bbb-3(b)(1), unless the authorization is terminated or revoked sooner.  Performed at [facility], [place] [address], [place], [place] [address]      Studies: MR ABDOMEN WO CONTRAST  Result Date: 04/22/2020 CLINICAL DATA:  Cirrhosis and portal venous hypertension with new liver lesion identified on CT from 04/20/2020. EXAM: MRI ABDOMEN WITHOUT CONTRAST TECHNIQUE: Multiplanar multisequence MR imaging was performed without the administration of intravenous contrast. COMPARISON:  CT AP 04/20/2020. FINDINGS: Exam detail is diminished secondary to motion artifact. Further, evaluation for solid organ pathology may be limited by lack of IV contrast material. Lower chest: Bilateral pleural effusions are identified right greater than left. Hepatobiliary: Morphologic features of cirrhosis identified. Diffuse hepatic steatosis identified. Corresponding to the CT abnormality is a lesion within anterolateral dome of liver (segment 7/8) measuring 3.1  x 3.6 cm, image 10/9. Lesion appears well-circumscribed and slightly exophytic. This shows increased signal on the diffusion-weighted images. On CT from 04/20/2020 there is washout associated with this lesion on the portal venous phase images. Further, this was not seen on previous abdominal sonogram from 12/19/2017 and CT AP from 02/17/2014. No additional liver lesions identified. Mild gallbladder wall edema is nonspecific in the setting of cirrhosis and portal venous hypertension. No bile duct dilatation. Pancreas: No mass, inflammatory changes, or other parenchymal abnormality identified.  Spleen: Splenomegaly is again noted with a cranial caudal dimension of 13.8 cm. 1.3 cm T2 hyperintense foci within the inferior spleen is unchanged from remote CT dated 02/17/2014. Within the central spleen there is a smaller T2 hyperintense structure measuring 0.9 cm. Not confidently identified previously additionally, there is a 8 mm T2 hyperintense structure within the superior spleen. Not seen previously. Adrenals/Urinary Tract: Normal appearance of the adrenal glands. Asymmetric atrophy of the right kidney. Stone within the upper pole of right kidney is again noted measuring 7 mm. Within the left mid kidney there is a 1.5 x 1.0 cm cyst. Incompletely characterized without IV contrast. No hydronephrosis identified bilaterally. Stomach/Bowel: Visualized portions within the abdomen are unremarkable. Vascular/Lymphatic: Aortic atherosclerosis. No aneurysm. No upper abdominal adenopathy small gastroesophageal and gastric fundus varices. Other:  Trace ascites identified. Musculoskeletal: No acute abnormality. Vertebral hemangiomas identified within the lumbar spine. IMPRESSION: 1. Exam detail is diminished secondary to motion artifact and lack of IV contrast material. 2. Corresponding to the CT abnormality is a lesion within the anterolateral dome of liver segment 7/8 measuring 3.1 x 3.6 cm. Technically incompletely characterized  without IV contrast. However, based upon the enhancement characteristics from CT dated 04/20/2020 and review of more remote imaging including abdominal sonogram from 12/19/2017 and CT AP from 02/17/2014 findings are compatible with a LR-4 lesion, probably hepatocellular carcinoma. When the patient is clinically stable and able to follow directions and hold their breath (preferably as an outpatient) further evaluation with dedicated liver protocol abdominal MRI without and with contrast should be considered. 3. Morphologic features of cirrhosis with stigmata of portal venous hypertension including splenomegaly, trace ascites, and small gastroesophageal and gastric fundus varices. 4. Small T2 hyperintense lesions within the spleen are unchanged from remote CT dated 02/17/2014. These are favored to be benign in etiology. 5. Bilateral pleural effusions are identified right greater than left. Electronically Signed   By: Kerby Moors M.D.   On: 04/22/2020 06:01      Flora Lipps, MD  [facility] 04/22/2020

## 2020-04-22 NOTE — Progress Notes (Signed)
MRI liver reviewed today by Dr. Earleen Newport who is confident that lesion is [facility] per imaging criteria and does not recommend proceeding with biopsy. Instead, he recommends patient be referred to IR clinic as an outpatient to discuss targeted liver therapy for [facility].   Above information has been relayed to GI team via securechat today. No plans for procedure in IR at this time.   Please call with questions or concerns.  Candiss Norse, PA-C Pager# [phone]

## 2020-04-23 ENCOUNTER — Other Ambulatory Visit: Payer: Self-pay | Admitting: Physician Assistant

## 2020-04-23 DIAGNOSIS — K7689 Other specified diseases of liver: Secondary | ICD-10-CM | POA: Diagnosis not present

## 2020-04-23 DIAGNOSIS — I5033 Acute on chronic diastolic (congestive) heart failure: Secondary | ICD-10-CM | POA: Diagnosis not present

## 2020-04-23 DIAGNOSIS — I864 Gastric varices: Secondary | ICD-10-CM | POA: Diagnosis not present

## 2020-04-23 DIAGNOSIS — E785 Hyperlipidemia, unspecified: Secondary | ICD-10-CM

## 2020-04-23 DIAGNOSIS — K766 Portal hypertension: Secondary | ICD-10-CM | POA: Diagnosis not present

## 2020-04-23 DIAGNOSIS — I2510 Atherosclerotic heart disease of native coronary artery without angina pectoris: Secondary | ICD-10-CM | POA: Diagnosis not present

## 2020-04-23 DIAGNOSIS — D649 Anemia, unspecified: Secondary | ICD-10-CM | POA: Diagnosis not present

## 2020-04-23 DIAGNOSIS — C220 Liver cell carcinoma: Secondary | ICD-10-CM

## 2020-04-23 DIAGNOSIS — K7460 Unspecified cirrhosis of liver: Secondary | ICD-10-CM | POA: Diagnosis not present

## 2020-04-23 LAB — COMPREHENSIVE METABOLIC PANEL
ALT: 16 U/L (ref 0–44)
AST: 22 U/L (ref 15–41)
Albumin: 3.2 g/dL — ABNORMAL LOW (ref 3.5–5.0)
Alkaline Phosphatase: 99 U/L (ref 38–126)
Anion gap: 9 (ref 5–15)
BUN: 19 mg/dL (ref 8–23)
CO2: 27 mmol/L (ref 22–32)
Calcium: 8.7 mg/dL — ABNORMAL LOW (ref 8.9–10.3)
Chloride: 103 mmol/L (ref 98–111)
Creatinine, Ser: 1.49 mg/dL — ABNORMAL HIGH (ref 0.61–1.24)
GFR calc Af Amer: 53 mL/min — ABNORMAL LOW (ref >60)
GFR calc non Af Amer: 46 mL/min — ABNORMAL LOW (ref >60)
Glucose, Bld: 105 mg/dL — ABNORMAL HIGH (ref 70–99)
Potassium: 4 mmol/L (ref 3.5–5.1)
Sodium: 139 mmol/L (ref 135–145)
Total Bilirubin: 0.9 mg/dL (ref 0.3–1.2)
Total Protein: 6.2 g/dL — ABNORMAL LOW (ref 6.5–8.1)

## 2020-04-23 LAB — CBC
HCT: 28.1 % — ABNORMAL LOW (ref 39.0–52.0)
Hemoglobin: 8 g/dL — ABNORMAL LOW (ref 13.0–17.0)
MCH: 24 pg — ABNORMAL LOW (ref 26.0–34.0)
MCHC: 28.5 g/dL — ABNORMAL LOW (ref 30.0–36.0)
MCV: 84.1 fL (ref 80.0–100.0)
Platelets: 129 10*3/uL — ABNORMAL LOW (ref 150–400)
RBC: 3.34 MIL/uL — ABNORMAL LOW (ref 4.22–5.81)
RDW: 23.4 % — ABNORMAL HIGH (ref 11.5–15.5)
WBC: 3.8 10*3/uL — ABNORMAL LOW (ref 4.0–10.5)
nRBC: 0 % (ref 0.0–0.2)

## 2020-04-23 LAB — PHOSPHORUS: Phosphorus: 3.4 mg/dL (ref 2.5–4.6)

## 2020-04-23 LAB — GLUCOSE, CAPILLARY: Glucose-Capillary: 104 mg/dL — ABNORMAL HIGH (ref 70–99)

## 2020-04-23 LAB — MAGNESIUM: Magnesium: 1.8 mg/dL (ref 1.7–2.4)

## 2020-04-23 MED ORDER — PANTOPRAZOLE SODIUM 40 MG PO TBEC
40.0000 mg | DELAYED_RELEASE_TABLET | Freq: Every day | ORAL | 1 refills | Status: DC
Start: 2020-04-23 — End: 2020-08-18

## 2020-04-23 MED ORDER — POLYETHYLENE GLYCOL 3350 17 G PO PACK: 17.0000 g | PACK | Freq: Every day | ORAL | 0 refills | Status: DC | PRN

## 2020-04-23 NOTE — Plan of Care (Signed)

## 2020-04-23 NOTE — Discharge Summary (Signed)
Physician Discharge Summary  [name] LXB:[id number] DOB: 1946-08-12 DOA: 04/15/2020  PCP: John Koch, MD  Admit date: 04/15/2020 Discharge date: 04/23/2020  Admitted From: Home  Discharge disposition: Home   Recommendations for Outpatient Follow-Up:   . Follow up with your primary care provider in one week.  . Check CBC, BMP, magnesium in the next visit . Follow-up with interventional radiology for definite management of your liver mass (office to schedule follow-up plan) . Follow-up with GI as outpatient after IR evaluation and treatment.   Discharge Diagnosis:   Principal Problem:   Symptomatic anemia Active Problems:   Type 2 diabetes with complication (HCC)   OSA (obstructive sleep apnea)   Coronary artery disease involving native coronary artery of native heart without angina pectoris   Hyperlipidemia with target LDL less than 70   Hepatic cirrhosis (HCC)   Acute on chronic diastolic CHF (congestive heart failure) ([place])    Discharge Condition: Improved.  Diet recommendation: Low sodium, heart healthy.    Wound care: None.  Code status: Full.   History of Present Illness:   [name] a 74 y.o.malewith history ofCAD s/p CABG, HFpEF (EF 55-60%), T2DM, HTN, HLD, hepatic cirrhosis with portal gastropathy, history of GI bleed with previous gastric AVMs, iron deficiency anemia, GERD/Barrett's esophagus, and OSA intolerant to CPAP who presented to the ED for evaluation of dyspnea and lower extremity swelling x1 week. H/o occasional black stools.  Initial labs were notable for hemoglobin 5.9 (recent baseline 7.5), FOBT-positive. Chest x-ray -mild cardiomegaly and vascular congestion. Patient was started on lasix and PRBC. CT abdomen 8/23 for PRT of protocol noted liver lesion and not amenable [facility] Course:   Following conditions were addressed during hospitalization as listed below,  Severe symptomatic anemia on presentation  secondary to melena and heme positive stool.  Cirrhosis of liver with gastric varices.  FOBT was positive.  Hemoglobin was 5.9 on admission.Last EGD 4/24/2019noted gastricAVMs and was treated with APCs then.  Status post 3 units of packed RBC transfusion this admission.  Hemoglobin of 8.0 today.  Continue p.o. Protonix.  GI was consulted and patient underwent EGD 8/20 with Gastric varices, no active bleeding.  GI recommended IR consult for balloon occluded retrograde transvenous obliteration (BRTO) for gastric varices and refractory ascites. Patient underwent CT abdomen  with contrast which showed liver lesion so he wasn't considered for BRTO.Marland Kitchen  MRI without contrast was performed which was concerning for [facility] at this time.  IR followed  the patient and recommend outpatient follow up for further evaluation and localized treatment plan.   Liver mass/lesion Noted on CT of the abdomen on 8/23, MRI of the abdomen without contrast was performed with possibility of hepatocellular carcinoma as per interventional radiology.  Alpha-fetoprotein, CEA/ CA 19-9 was unremarkable.  As per conversation with GI by interventional radiology, plan was outpatient follow-up for this liver mass and treat with ablation for possible hepatocellular cancer.   Acute on chronic diastolic CHF: Last known  EF 55-60% by TTE 07/2019. Peripheral edema but no dyspnea.  Lasix and Aldactone will be resumed on discharge.  Creatinine of 1.4 prior to discharge.  CAD s/p CABG: -No acute issues.  Continue atorvastatin, aspirin   Type II diabetes mellitus: Resume diabetic diet, Metformin and Amaryl.    Essential hypertension: Resume Aldactone.  Hepatic cirrhosis with portal gastropathy, reactive ascites. Likely secondary to alcohol use disorder and possibly NASH.  Compensated at this time.  Will resume spironolactone and Lasix  on discharge..  Hyperlipidemia: Continue atorvastatin.  OSA: Not on CPAP due to  intolerance.  Disposition.  At this time, patient is stable for disposition home with outpatient PCP interventional radiology and GI follow-up  Medical Consultants:    GI  Interventional radiology  Procedures:    None Subjective:   Today, patient feels okay.  Denies any abdominal pain nausea or vomiting or shortness of breath.  Discharge Exam:   Vitals:   04/23/20 0305 04/23/20 0735  BP: 136/61   Pulse: 60   Resp: 18   Temp: 98.1 F (36.7 C)   SpO2:  96%   Vitals:   04/23/20 0031 04/23/20 0302 04/23/20 0305 04/23/20 0735  BP: (!) 117/59  136/61   Pulse: 62  60   Resp: 15  18   Temp: 98.6 F (37 C)  98.1 F (36.7 C)   TempSrc: Oral  Oral   SpO2: 95%   96%  Weight:  108.2 kg     Body mass index is 36.26 kg/m. General: Alert awake, not in obvious distress, obese HENT: pupils equally reacting to light, mild pallor noted noted. Oral mucosa is moist.  Chest:  Clear breath sounds.  Diminished breath sounds bilaterally. No crackles or wheezes.  CVS: S1 &S2 heard. No murmur.  Regular rate and rhythm. Abdomen: Soft, nontender, mildly distended, bowel sounds are heard.   Extremities: No cyanosis, clubbing but bilateral lower extremity edema noted, peripheral pulses are palpable. Psych: Alert, awake and oriented, normal mood CNS:  No cranial nerve deficits.  Power equal in all extremities.   Skin: Warm and dry.  No rashes noted.  The results of significant diagnostics from this hospitalization (including imaging, microbiology, ancillary and laboratory) are listed below for reference.     Diagnostic Studies:   DG Chest 2 View  Result Date: 04/15/2020 CLINICAL DATA:  74 year old male with shortness of breath. EXAM: CHEST - 2 VIEW COMPARISON:  Chest radiograph dated 12/26/2019. FINDINGS: There is mild cardiomegaly and mild vascular congestion and edema. Pneumonia is not excluded clinical correlation is recommended. No focal consolidation, pleural effusion, pneumothorax.  Median sternotomy wires and CABG vascular clips. Atherosclerotic calcification of the aorta. No acute osseous pathology. IMPRESSION: Mild cardiomegaly with mild vascular congestion and edema. No focal consolidation. Electronically Signed   By: Anner Crete M.D.   On: 04/15/2020 16:28     Labs:   Basic Metabolic Panel: Recent Labs  Lab 04/19/20 0055 04/19/20 0055 04/20/20 0617 04/20/20 0617 04/21/20 0840 04/21/20 0840 04/22/20 0953 04/23/20 0417  NA 135  --  137  --  136  --  137 139  K 3.8   < > 4.3   < > 4.4   < > 4.4 4.0  CL 100  --  101  --  104  --  103 103  CO2 26  --  25  --  25  --  23 27  GLUCOSE 89  --  80  --  135*  --  190* 105*  BUN 16  --  13  --  12  --  16 19  CREATININE 1.26*  --  1.18  --  1.20  --  1.27* 1.49*  CALCIUM 8.9  --  9.4  --  9.1  --  9.1 8.7*  MG  --   --   --   --   --   --   --  1.8  PHOS  --   --   --   --   --   --   --  3.4   < > = values in this interval not displayed.   GFR Estimated Creatinine Clearance: 51.9 mL/min (A) (by C-G formula based on SCr of 1.49 mg/dL (H)). Liver Function Tests: Recent Labs  Lab 04/17/20 0439 04/18/20 0213 04/21/20 0840 04/23/20 0417  AST '25 22 22 22  ' ALT '13 16 15 16  ' ALKPHOS 90 94 115 99  BILITOT 1.7* 1.5* 1.2 0.9  PROT 6.1* 6.1* 6.7 6.2*  ALBUMIN 3.2* 3.2* 3.4* 3.2*   No results for input(s): LIPASE, AMYLASE in the last 168 hours. No results for input(s): AMMONIA in the last 168 hours. Coagulation profile No results for input(s): INR, PROTIME in the last 168 hours.  CBC: Recent Labs  Lab 04/19/20 0055 04/19/20 0055 04/19/20 0753 04/20/20 0617 04/21/20 0840 04/22/20 0953 04/23/20 0417  WBC 5.0  --   --  4.1 4.2 4.9 3.8*  HGB 7.6*   < > 7.2* 7.3* 8.0* 8.8* 8.0*  HCT 26.6*   < > 26.3* 26.6* 28.4* 31.0* 28.1*  MCV 78.2*  --   --  81.1 82.3 84.0 84.1  PLT 122*  --   --  126* 140* 152 129*   < > = values in this interval not displayed.   Cardiac Enzymes: No results for input(s):  CKTOTAL, CKMB, CKMBINDEX, TROPONINI in the last 168 hours. BNP: Invalid input(s): POCBNP CBG: Recent Labs  Lab 04/22/20 0722 04/22/20 1146 04/22/20 1614 04/22/20 2057 04/23/20 0734  GLUCAP 110* 153* 145* 126* 104*   D-Dimer No results for input(s): DDIMER in the last 72 hours. Hgb A1c No results for input(s): HGBA1C in the last 72 hours. Lipid Profile No results for input(s): CHOL, HDL, LDLCALC, TRIG, CHOLHDL, LDLDIRECT in the last 72 hours. Thyroid function studies No results for input(s): TSH, T4TOTAL, T3FREE, THYROIDAB in the last 72 hours.  Invalid input(s): FREET3 Anemia work up No results for input(s): VITAMINB12, FOLATE, FERRITIN, TIBC, IRON, RETICCTPCT in the last 72 hours. Microbiology Recent Results (from the past 240 hour(s))  SARS Coronavirus 2 by RT PCR (hospital order, performed in [facility] hospital lab) Nasopharyngeal Nasopharyngeal Swab     Status: None   Collection Time: 04/15/20  9:02 PM   Specimen: Nasopharyngeal Swab  Result Value Ref Range Status   SARS Coronavirus 2 NEGATIVE NEGATIVE Final    Comment: (NOTE) SARS-CoV-2 target nucleic acids are NOT DETECTED.  The SARS-CoV-2 RNA is generally detectable in upper and lower respiratory specimens during the acute phase of infection. The lowest concentration of SARS-CoV-2 viral copies this assay can detect is 250 copies / mL. A negative result does not preclude SARS-CoV-2 infection and should not be used as the sole basis for treatment or other patient management decisions.  A negative result may occur with improper specimen collection / handling, submission of specimen other than nasopharyngeal swab, presence of viral mutation(s) within the areas targeted by this assay, and inadequate number of viral copies (<250 copies / mL). A negative result must be combined with clinical observations, patient history, and epidemiological information.  Fact Sheet for Patients:    StrictlyIdeas.no  Fact Sheet for Healthcare Providers: BankingDealers.co.za  This test is not yet approved or  cleared by the [place] FDA and has been authorized for detection and/or diagnosis of SARS-CoV-2 by FDA under an Emergency Use Authorization (EUA).  This EUA will remain in effect (meaning this test can be used) for the duration of the COVID-19 declaration under Section 564(b)(1) of the Act, 21 U.S.C. section 360bbb-3(b)(1), unless  the authorization is terminated or revoked sooner.  Performed at [facility], [place] [address] [place], [place] [address]      Discharge Instructions:   Discharge Instructions    Call MD for:  persistant nausea and vomiting   Complete by: As directed    Call MD for:  severe uncontrolled pain   Complete by: As directed    Call MD for:  temperature >100.4   Complete by: As directed    Diet - low sodium heart healthy   Complete by: As directed    Diet Carb Modified   Complete by: As directed    Discharge instructions   Complete by: As directed    Follow up with interventional radiology for treatment of liver lesion (office to contact you) Follow up with GI as outpatient in 2 months. Follow up with your primary care provider in 1 week (check blood work at that time)   Increase activity slowly   Complete by: As directed      Allergies as of 04/23/2020   No Known Allergies     Medication List    TAKE these medications   Accu-Chek Softclix Lancets lancets USE AS DIRECTED TO TEST BLOOD SUGAR FOUR TIMES DAILY What changed: See the new instructions.   albuterol 108 (90 Base) MCG/ACT inhaler Commonly known as: VENTOLIN HFA Inhale 2 puffs into the lungs every 6 (six) hours as needed for wheezing or shortness of breath.   Anoro Ellipta 62.5-25 MCG/INH Aepb Generic drug: umeclidinium-vilanterol Inhale 1 puff into the lungs daily.   atorvastatin 40 MG tablet Commonly known as:  LIPITOR Take 1 tablet (40 mg total) by mouth daily at 6 PM.   blood glucose meter kit and supplies Dispense based on patient and insurance preference. Use up to four times daily as directed. (FOR ICD-10 E10.9, E11.9). What changed:   how much to take  how to take this  when to take this   ferrous sulfate 325 (65 FE) MG EC tablet Take 1 tablet (325 mg total) by mouth daily with breakfast.   gabapentin 100 MG capsule Commonly known as: NEURONTIN TAKE 1 CAPSULE(100 MG) BY MOUTH TWICE DAILY What changed:   how much to take  how to take this  when to take this   glimepiride 2 MG tablet Commonly known as: AMARYL Take 2 mg by mouth daily.   metFORMIN 850 MG tablet Commonly known as: GLUCOPHAGE Take 1 tablet (850 mg total) by mouth 2 (two) times daily with a meal.   nitroGLYCERIN 0.4 MG SL tablet Commonly known as: NITROSTAT Place 1 tablet (0.4 mg total) under the tongue every 5 (five) minutes as needed for chest pain.   pantoprazole 40 MG tablet Commonly known as: PROTONIX Take 1 tablet (40 mg total) by mouth daily.   polyethylene glycol 17 g packet Commonly known as: MIRALAX / GLYCOLAX Take 17 g by mouth daily as needed for moderate constipation.   spironolactone 25 MG tablet Commonly known as: ALDACTONE Take 1 tablet (25 mg total) by mouth daily.   torsemide 20 MG tablet Commonly known as: DEMADEX Take 40 mg (2 pills) every morning and every afternoon except Mon, Wed, Fri afternoons take only 20 mg (1 pill)       Follow-up Information    John Koch, MD Follow up in 1 week(s).   Specialty: Internal Medicine Why: regular followup and lab work Contact information: [place] [address] [phone]  Nahser, Wonda Cheng, MD .   Specialty: Cardiology Contact information: [place] [address] [phone]        Interventional radiology Follow up.   Why: office to set up appointment        Arta Silence, MD Follow up in 2 month(s).   Specialty: Gastroenterology Why: for GI followup, after liver lesion treatment Contact information: [address] [place]  [address] [phone]               Time coordinating discharge: 39 minutes  Signed:  Rai Sinagra  [facility] 04/23/2020, 9:33 AM

## 2020-04-23 NOTE — H&P (Signed)
Chief Complaint: Liver lesion  Referring Physician(s): Arta Silence  Supervising Physician: Corrie Mckusick  Patient Status: [facility] - In-pt  History of Present Illness: [name] is a 74 y.o. male with h/oCAD s/p CABG, HFpEF (EF 55-60%), T2DM, HTN, HLD, hepatic cirrhosis with portal gastropathy, history of GI bleed with previous gastric AVMs, iron deficiency anemia, GERD/Barrett's esophagus, and OSA intolerant to CPAP.  He presented to the ED for evaluation of dyspnea and lower extremity swelling x1 week.   He also reported occasional black stools.  CTA with BRTO protocol showed = 1. No findings to localize GI bleeding. No intraluminal contrast identified. 2. Stigmata of cirrhosis and portal hypertension. 3. Small gastroesophageal and gastric fundus varices. 4. Hypoenhancing subcapsular lesion of the anterior liver dome, hepatic segment VII, measuring 4.0 x 3.1 cm. This lesion is new compared to CT dated 02/17/2014 and although not typical in contrast enhancement characteristics is nonetheless suspicious for hepatocellular carcinoma in the setting of cirrhosis.    MR Abdomen showed = Corresponding to the CT abnormality is a lesion within the anterolateral dome of liver segment 7/8 measuring 3.1 x 3.6 cm. Technically incompletely characterized without IV contrast. However, based upon the enhancement characteristics from CT dated 04/20/2020 and review of more remote imaging including abdominal sonogram from 12/19/2017 and CT AP from 02/17/2014 findings are compatible with a LR-4 lesion, probably hepatocellular carcinoma. When the patient is clinically stable and able to follow directions and hold their breath (preferably as an outpatient) further evaluation with dedicated liver protocol abdominal MRI without and with contrast should be considered.    We are asked to evaluate patient for possible biopsy for tissue diagnosis and discuss treatment options.  Past  Medical History:  Diagnosis Date  . Arthritis    "joints tighten up sometimes" (03/27/2104)  . CHF (congestive heart failure) ([place])   . Chronic lower back pain   . Coronary artery disease    a. 05/2012 Cath/PCI: LM 40ost, 50-60d, LAD 99p ruptured plaque (3.0x28 DES), LCX 50p/m, RCA 30-40p, 3m EF 65-70%.  . Coronary artery disease involving native coronary artery of native heart without angina pectoris    Severe left main disease at catheterization July 2015  CABG x3 with a LIMA to the LAD, SVG to the OM, SVG to the PDA on 03/31/14. EF 60% by cath.   . GERD without esophagitis 08/04/2010  . Hepatic [place]    a. Dx 01/2014 - CT a/p   . History of blood transfusion    "related to bleeding ulcers"  . History of concussion    1976--  NO RESIDUAL  . History of GI bleed    a. UGIB 07/2012;  b. 01/2014 admission with GIB/FOB stool req 1U prbc's->EGD showed portal gastropathy, barrett's esoph, and chronic active h. pylori gastritis.  .Marland KitchenHistory of gout    2007 &  2008  LEFT LEG-- NO ISSUE SINCE  . Hyperlipidemia   . Iron deficiency anemia   . Kidney stones   . OA (osteoarthritis of spine)    LOWER BACK--  INTERMITTANT LEFT LEG NUMBNESS  . OSA (obstructive sleep apnea)    PULMOLOGIST-  DR CLANCE--  MODERATE OSA  STARTED CPAP 2012--  BUT CURRENTLY HAS NOT USED PAST 6 MONTHS  . Phimosis    a. s/p circumcision 2015.  . Type 2 diabetes mellitus ([place]   . Unspecified essential hypertension     Past Surgical History:  Procedure Laterality Date  . ANKLE FRACTURE SURGERY  Right 1989   "plate put in"  . APPENDECTOMY  05-16-2004   open  . CIRCUMCISION N/A 09/09/2013   Procedure: CIRCUMCISION ADULT;  Surgeon: Bernestine Amass, MD;  Location: [facility];  Service: Urology;  Laterality: N/A;  . COLECTOMY  05-16-2004  . CORONARY ANGIOPLASTY WITH STENT PLACEMENT  06/28/2012  DR COOPER   PCI W/  X1 DES to [place]. LAD/  LM  40% OSTIAL & 50-60% DISTAL /  50% PROX LCX/  30-40% PROX RCA &  50% MID RCA/   LVEF 65-70%  . CORONARY ARTERY BYPASS GRAFT N/A 03/31/2014   Procedure: CORONARY ARTERY BYPASS GRAFTING (CABG) times 3 using left internal mammary artery and right saphenous vein.;  Surgeon: Melrose Nakayama, MD;  Location: [place];  Service: Open Heart Surgery;  Laterality: N/A;  . DOBUTAMINE STRESS ECHO  06-08-2012   MODERATE HYPOKINESIS/ ISCHEMIA MID INFERIOR WALL  . ESOPHAGOGASTRODUODENOSCOPY  08/15/2012   Procedure: ESOPHAGOGASTRODUODENOSCOPY (EGD);  Surgeon: Wonda Horner, MD;  Location: [facility] ENDOSCOPY;  Service: Endoscopy;  Laterality: N/A;  . ESOPHAGOGASTRODUODENOSCOPY N/A 02/17/2014   Procedure: ESOPHAGOGASTRODUODENOSCOPY (EGD);  Surgeon: Jeryl Columbia, MD;  Location: [facility] ENDOSCOPY;  Service: Endoscopy;  Laterality: N/A;  . ESOPHAGOGASTRODUODENOSCOPY N/A 04/17/2020   Procedure: ESOPHAGOGASTRODUODENOSCOPY (EGD);  Surgeon: Ronnette Juniper, MD;  Location: [place];  Service: Gastroenterology;  Laterality: N/A;  . ESOPHAGOGASTRODUODENOSCOPY (EGD) WITH PROPOFOL N/A 09/30/2017   Procedure: ESOPHAGOGASTRODUODENOSCOPY (EGD) WITH PROPOFOL;  Surgeon: Wonda Horner, MD;  Location: [facility] ENDOSCOPY;  Service: Endoscopy;  Laterality: N/A;  . ESOPHAGOGASTRODUODENOSCOPY (EGD) WITH PROPOFOL N/A 12/20/2017   Procedure: ESOPHAGOGASTRODUODENOSCOPY (EGD) WITH PROPOFOL;  Surgeon: Clarene Essex, MD;  Location: [place];  Service: Endoscopy;  Laterality: N/A;  . HERNIA REPAIR    . INTRAOPERATIVE TRANSESOPHAGEAL ECHOCARDIOGRAM N/A 03/31/2014   Procedure: INTRAOPERATIVE TRANSESOPHAGEAL ECHOCARDIOGRAM;  Surgeon: Melrose Nakayama, MD;  Location: [place];  Service: Open Heart Surgery;  Laterality: N/A;  . LAPAROSCOPIC UMBILICAL HERNIA REPAIR W/ MESH  06-06-2011  . LEFT HEART CATHETERIZATION WITH CORONARY ANGIOGRAM N/A 03/28/2014   Procedure: LEFT HEART CATHETERIZATION WITH CORONARY ANGIOGRAM;  Surgeon: Sinclair Grooms, MD;  Location: [facility] CATH LAB;  Service: Cardiovascular;  Laterality: N/A;  . LIPOMA EXCISION  Left 08/25/2017   Procedure: EXCISION LIPOMA LEFT POSTERIOR THIGH;  Surgeon: Clovis Riley, MD;  Location: [place];  Service: General;  Laterality: Left;  . NEPHROLITHOTOMY  1990'S  . OPEN APPENDECTOMY W/ PARTIAL CECECTOMY  05-16-2004  . PERCUTANEOUS CORONARY STENT INTERVENTION (PCI-S) N/A 06/28/2012   Procedure: PERCUTANEOUS CORONARY STENT INTERVENTION (PCI-S);  Surgeon: Sherren Mocha, MD;  Location: [facility] CATH LAB;  Service: Cardiovascular;  Laterality: N/A;    Allergies: Patient has no known allergies.  Medications: Prior to Admission medications   Medication Sig Start Date End Date Taking? Authorizing Provider  Accu-Chek Softclix Lancets lancets USE AS DIRECTED TO TEST BLOOD SUGAR FOUR TIMES DAILY Patient taking differently: 1 each by Other route in the morning, at noon, in the evening, and at bedtime.  03/13/20  Yes [name], MD  albuterol (VENTOLIN HFA) 108 (90 Base) MCG/ACT inhaler Inhale 2 puffs into the lungs every 6 (six) hours as needed for wheezing or shortness of breath. 03/12/20  Yes [name], MD  atorvastatin (LIPITOR) 40 MG tablet Take 1 tablet (40 mg total) by mouth daily at 6 PM. 01/04/20  Yes Nita Sells, MD  blood glucose meter kit and supplies Dispense based on patient and insurance preference. Use up to four  times daily as directed. (FOR ICD-10 E10.9, E11.9). Patient taking differently: 1 each by Other route See admin instructions. Dispense based on patient and insurance preference. Use up to four times daily as directed. (FOR ICD-10 E10.9, E11.9). 03/12/20  Yes [name], MD  ferrous sulfate 325 (65 FE) MG EC tablet Take 1 tablet (325 mg total) by mouth daily with breakfast. 01/16/20  Yes [name], MD  gabapentin (NEURONTIN) 100 MG capsule TAKE 1 CAPSULE(100 MG) BY MOUTH TWICE DAILY Patient taking differently: Take 100 mg by mouth 2 (two) times daily. TAKE 1 CAPSULE(100 MG) BY MOUTH TWICE DAILY 05/14/19  Yes [name], MD  glimepiride (AMARYL) 2 MG tablet Take 2 mg by mouth daily. 03/29/20  Yes [provider]  metFORMIN (GLUCOPHAGE) 850 MG tablet Take 1 tablet (850 mg total) by mouth 2 (two) times daily with a meal. 05/30/19  Yes [name], MD  nitroGLYCERIN (NITROSTAT) 0.4 MG SL tablet Place 1 tablet (0.4 mg total) under the tongue every 5 (five) minutes as needed for chest pain. 06/08/16  Yes Nahser, Wonda Cheng, MD  spironolactone (ALDACTONE) 25 MG tablet Take 1 tablet (25 mg total) by mouth daily. 01/04/20  Yes Nita Sells, MD  torsemide (DEMADEX) 20 MG tablet Take 40 mg (2 pills) every morning and every afternoon except Mon, Wed, Fri afternoons take only 20 mg (1 pill) 02/06/20  Yes Nahser, Wonda Cheng, MD  umeclidinium-vilanterol (ANORO ELLIPTA) 62.5-25 MCG/INH AEPB Inhale 1 puff into the lungs daily. 03/12/20  Yes [name], MD     Family History  Problem Relation Age of Onset  . Lung cancer Sister   . Cancer Sister        lung  . Cancer Mother   . Cancer Father        died in his 46s.  . Cancer Brother        lung  . Coronary artery disease Other   . Diabetes Other   . Colon cancer Other   . Cancer Sister        lung    Social History   Socioeconomic History  . Marital status: Married    Spouse name: Not on file  . Number of children: Not on file  . Years of education: Not on file  . Highest education level: Not on file  Occupational History  . Not on file  Tobacco Use  . Smoking status: Former Smoker    Packs/day: 2.00    Years: 40.00    Pack years: 80.00    Types: Cigarettes    Quit date: 08/29/1996    Years since quitting: 23.6  . Smokeless tobacco: Never Used  Vaping Use  . Vaping Use: Never used  Substance and Sexual Activity  . Alcohol use: Yes    Alcohol/week: 8.0 standard drinks    Types: 8 Cans of beer per week    Comment: 2 beers every other day  . Drug use: No  . Sexual activity: Yes  Other Topics Concern  . Not on  file  Social History Narrative   Lives in [place] with wife, son, and son's family.   Social Determinants of Health   Financial Resource Strain:   . Difficulty of Paying Living Expenses: Not on file  Food Insecurity:   . Worried About Charity fundraiser in the Last Year: Not on file  . Ran Out of Food in the Last Year: Not on file  Transportation Needs:   .  Lack of Transportation (Medical): Not on file  . Lack of Transportation (Non-Medical): Not on file  Physical Activity:   . Days of Exercise per Week: Not on file  . Minutes of Exercise per Session: Not on file  Stress:   . Feeling of Stress : Not on file  Social Connections:   . Frequency of Communication with Friends and Family: Not on file  . Frequency of Social Gatherings with Friends and Family: Not on file  . Attends Religious Services: Not on file  . Active Member of Clubs or Organizations: Not on file  . Attends Archivist Meetings: Not on file  . Marital Status: Not on file     Review of Systems: A 12 point ROS discussed and pertinent positives are indicated in the HPI above.  All other systems are negative.  Review of Systems  Vital Signs: BP 136/61 (BP Location: Right Arm)   Pulse 60   Temp 98.1 F (36.7 C) (Oral)   Resp 18   Wt 108.2 kg   SpO2 96%   BMI 36.26 kg/m   Physical Exam Vitals reviewed.  Constitutional:      Appearance: Normal appearance.  HENT:     Head: Normocephalic and atraumatic.  Eyes:     Extraocular Movements: Extraocular movements intact.  Cardiovascular:     Rate and Rhythm: Normal rate and regular rhythm.  Pulmonary:     Effort: Pulmonary effort is normal. No respiratory distress.     Breath sounds: Normal breath sounds.  Abdominal:     Palpations: Abdomen is soft.  Musculoskeletal:        General: Normal range of motion.     Cervical back: Normal range of motion.  Skin:    General: Skin is warm and dry.  Neurological:     General: No focal deficit present.      Mental Status: He is alert and oriented to person, place, and time.  Psychiatric:        Mood and Affect: Mood normal.        Behavior: Behavior normal.        Thought Content: Thought content normal.        Judgment: Judgment normal.     Imaging: DG Chest 2 View  Result Date: 04/15/2020 CLINICAL DATA:  74 year old male with shortness of breath. EXAM: CHEST - 2 VIEW COMPARISON:  Chest radiograph dated 12/26/2019. FINDINGS: There is mild cardiomegaly and mild vascular congestion and edema. Pneumonia is not excluded clinical correlation is recommended. No focal consolidation, pleural effusion, pneumothorax. Median sternotomy wires and CABG vascular clips. Atherosclerotic calcification of the aorta. No acute osseous pathology. IMPRESSION: Mild cardiomegaly with mild vascular congestion and edema. No focal consolidation. Electronically Signed   By: Anner Crete M.D.   On: 04/15/2020 16:28   MR ABDOMEN WO CONTRAST  Result Date: 04/22/2020 CLINICAL DATA:  Cirrhosis and portal venous hypertension with new liver lesion identified on CT from 04/20/2020. EXAM: MRI ABDOMEN WITHOUT CONTRAST TECHNIQUE: Multiplanar multisequence MR imaging was performed without the administration of intravenous contrast. COMPARISON:  CT AP 04/20/2020. FINDINGS: Exam detail is diminished secondary to motion artifact. Further, evaluation for solid organ pathology may be limited by lack of IV contrast material. Lower chest: Bilateral pleural effusions are identified right greater than left. Hepatobiliary: Morphologic features of cirrhosis identified. Diffuse hepatic steatosis identified. Corresponding to the CT abnormality is a lesion within anterolateral dome of liver (segment 7/8) measuring 3.1 x 3.6 cm, image 10/9. Lesion appears well-circumscribed  and slightly exophytic. This shows increased signal on the diffusion-weighted images. On CT from 04/20/2020 there is washout associated with this lesion on the portal venous phase  images. Further, this was not seen on previous abdominal sonogram from 12/19/2017 and CT AP from 02/17/2014. No additional liver lesions identified. Mild gallbladder wall edema is nonspecific in the setting of cirrhosis and portal venous hypertension. No bile duct dilatation. Pancreas: No mass, inflammatory changes, or other parenchymal abnormality identified. Spleen: Splenomegaly is again noted with a cranial caudal dimension of 13.8 cm. 1.3 cm T2 hyperintense foci within the inferior spleen is unchanged from remote CT dated 02/17/2014. Within the central spleen there is a smaller T2 hyperintense structure measuring 0.9 cm. Not confidently identified previously additionally, there is a 8 mm T2 hyperintense structure within the superior spleen. Not seen previously. Adrenals/Urinary Tract: Normal appearance of the adrenal glands. Asymmetric atrophy of the right kidney. Stone within the upper pole of right kidney is again noted measuring 7 mm. Within the left mid kidney there is a 1.5 x 1.0 cm cyst. Incompletely characterized without IV contrast. No hydronephrosis identified bilaterally. Stomach/Bowel: Visualized portions within the abdomen are unremarkable. Vascular/Lymphatic: Aortic atherosclerosis. No aneurysm. No upper abdominal adenopathy small gastroesophageal and gastric fundus varices. Other:  Trace ascites identified. Musculoskeletal: No acute abnormality. Vertebral hemangiomas identified within the lumbar spine. IMPRESSION: 1. Exam detail is diminished secondary to motion artifact and lack of IV contrast material. 2. Corresponding to the CT abnormality is a lesion within the anterolateral dome of liver segment 7/8 measuring 3.1 x 3.6 cm. Technically incompletely characterized without IV contrast. However, based upon the enhancement characteristics from CT dated 04/20/2020 and review of more remote imaging including abdominal sonogram from 12/19/2017 and CT AP from 02/17/2014 findings are compatible with a  LR-4 lesion, probably hepatocellular carcinoma. When the patient is clinically stable and able to follow directions and hold their breath (preferably as an outpatient) further evaluation with dedicated liver protocol abdominal MRI without and with contrast should be considered. 3. Morphologic features of cirrhosis with stigmata of portal venous hypertension including splenomegaly, trace ascites, and small gastroesophageal and gastric fundus varices. 4. Small T2 hyperintense lesions within the spleen are unchanged from remote CT dated 02/17/2014. These are favored to be benign in etiology. 5. Bilateral pleural effusions are identified right greater than left. Electronically Signed   By: Kerby Moors M.D.   On: 04/22/2020 06:01   CT ABDOMEN PELVIS W CONTRAST  Result Date: 04/20/2020 CLINICAL DATA:  Gastric varices, BRTO protocol EXAM: CT ABDOMEN AND PELVIS WITH CONTRAST TECHNIQUE: Multidetector CT imaging of the abdomen and pelvis was performed using the standard protocol following bolus administration of intravenous contrast. CONTRAST:  147m OMNIPAQUE IOHEXOL 350 MG/ML SOLN COMPARISON:  CT abdomen pelvis, 02/17/2014 FINDINGS: Lower chest: Moderate right, small left pleural effusions and associated atelectasis or consolidation. Hepatobiliary: Coarse, nodular contour of the liver. Hypoenhancing subcapsular lesion of the anterior liver dome, hepatic segment VII, measuring 4.0 x 3.1 cm (series 8, image 23). No gallstones, gallbladder wall thickening, or biliary dilatation. Pancreas: Unremarkable. No pancreatic ductal dilatation or surrounding inflammatory changes. Spleen: Splenomegaly, maximum coronal span 15.4 cm. Adrenals/Urinary Tract: Adrenal glands are unremarkable. Asymmetric atrophy of the right kidney. No hydronephrosis. Nonobstructive calculus in the anterior midportion of the right kidney. The left kidney is normal. Bladder is unremarkable. Stomach/Bowel: Stomach is within normal limits. Appendix is  surgically absent. No evidence of bowel wall thickening, distention, or inflammatory changes. Sigmoid diverticulosis. Vascular/Lymphatic: Aortic atherosclerosis. Incidental  note of variant anatomy of the celiac trunk, with the right gastric artery arising from the gastroduodenal artery. There is probable high-grade atherosclerotic stenosis of the origin of the right renal artery (series 4, image 58). Small gastroesophageal and gastric fundus varices. No enlarged abdominal or pelvic lymph nodes. Reproductive: No mass or other significant abnormality. Other: No abdominal wall hernia or abnormality. No abdominopelvic ascites. Musculoskeletal: No acute or significant osseous findings. IMPRESSION: 1. No findings to localize GI bleeding. No intraluminal contrast identified. 2. Stigmata of cirrhosis and portal hypertension. 3. Small gastroesophageal and gastric fundus varices. 4. Hypoenhancing subcapsular lesion of the anterior liver dome, hepatic segment VII, measuring 4.0 x 3.1 cm. This lesion is new compared to CT dated 02/17/2014 and although not typical in contrast enhancement characteristics is nonetheless suspicious for hepatocellular carcinoma in the setting of cirrhosis. Recommend multiphasic contrast enhanced MRI to further characterize on a nonemergent basis when clinically appropriate. 5. Moderate right, small left pleural effusions and associated atelectasis or consolidation. 6. Asymmetric atrophy of the right kidney, which is markedly progressed compared to prior examination dated 2015. Nonobstructive right nephrolithiasis. There is probable high-grade atherosclerotic stenosis of the origin of the right renal artery which likely explains renal atrophy. 7. Aortic Atherosclerosis (ICD10-I70.0). Electronically Signed   By: Eddie Candle M.D.   On: 04/20/2020 13:59    Labs:  CBC: Recent Labs    04/20/20 0617 04/21/20 0840 04/22/20 0953 04/23/20 0417  WBC 4.1 4.2 4.9 3.8*  HGB 7.3* 8.0* 8.8* 8.0*  HCT  26.6* 28.4* 31.0* 28.1*  PLT 126* 140* 152 129*    COAGS: Recent Labs    11/01/19 0603 01/03/20 0518 04/15/20 2102  INR 1.2 1.2 1.1    BMP: Recent Labs    04/20/20 0617 04/21/20 0840 04/22/20 0953 04/23/20 0417  NA 137 136 137 139  K 4.3 4.4 4.4 4.0  CL 101 104 103 103  CO2 _0 GLUCOSE 80 135* 190* 105*  BUN _1 CALCIUM 9.4 9.1 9.1 8.7*  CREATININE 1.18 1.20 1.27* 1.49*  GFRNONAA >60 59* 55* 46*  GFRAA >60 >60 >60 53*    LIVER FUNCTION TESTS: Recent Labs    04/17/20 0439 04/18/20 0213 04/21/20 0840 04/23/20 0417  BILITOT 1.7* 1.5* 1.2 0.9  AST _2 ALT _3 ALKPHOS 90 94 115 99  PROT 6.1* 6.1* 6.7 6.2*  ALBUMIN 3.2* 3.2* 3.4* 3.2*    TUMOR MARKERS: No results for input(s): AFPTM, CEA, CA199, CHROMGRNA in the last 8760 hours.  Assessment and Plan:  Liver lesion which is likely [place].  Dr. Earleen Newport saw patient today and discussed options with him.  The patient does NOT wish to undergo biopsy at this time and would like to be discharged today and return to our clinic to further discuss treatment options for hepatocellular carcinoma.  He will  likely need contrast enhanced MRI of the liver as outpatient.  I have placed the outpatient order for follow up and our clinic will contact patient with appointment details.  Thank you for this interesting consult.  I greatly enjoyed meeting [name] and look forward to participating in their care.  A copy of this report was sent to the requesting provider on this date.  Electronically Signed: Murrell Redden, PA-C   04/23/2020, 9:17 AM      I spent a total of 40 Minutes in face to face in clinical consultation, greater than  50% of which was counseling/coordinating care for consideration for liver biopsy/ treatment of HCC.

## 2020-04-23 NOTE — Progress Notes (Signed)
[name] 74 y.o. 1946/02/25  CC: Cirrhosis, IDA, suspected HCC   Subjective: Patient denies any complaints.  Last documented bowel movement was 8/24, the patient reports he had a bowel movement yesterday which was still dark, though he remains on Senekot.  Denies any abdominal pain, nausea, vomiting.  ROS : Review of Systems  Cardiovascular: Negative for chest pain and palpitations.  Gastrointestinal: Positive for melena. Negative for abdominal pain, blood in stool, constipation, diarrhea, heartburn, nausea and vomiting.    Objective: Vital signs in last 24 hours: Vitals:   04/23/20 0305 04/23/20 0735  BP: 136/61   Pulse: 60   Resp: 18   Temp: 98.1 F (36.7 C)   SpO2:  96%    Physical Exam:  General:  Alert, oriented, cooperative, no distress, obese  Head:  Normocephalic, without obvious abnormality, atraumatic  Eyes:   Mild conjunctival pallor, EOM's intact,   Lungs:   Clear to auscultation bilaterally, respirations unlabored  Heart:  Regular rate and rhythm, S1, S2 normal  Abdomen:   Soft, non-tender, nondistended, bowel sounds active all four quadrants  Extremities: Extremities normal, atraumatic, no  edema  Pulses: 2+ and symmetric    Lab Results: Recent Labs    04/22/20 0953 04/23/20 0417  NA 137 139  K 4.4 4.0  CL 103 103  CO2 23 27  GLUCOSE 190* 105*  BUN 16 19  CREATININE 1.27* 1.49*  CALCIUM 9.1 8.7*  MG  --  1.8  PHOS  --  3.4   Recent Labs    04/21/20 0840 04/23/20 0417  AST 22 22  ALT 15 16  ALKPHOS 115 99  BILITOT 1.2 0.9  PROT 6.7 6.2*  ALBUMIN 3.4* 3.2*   Recent Labs    04/22/20 0953 04/23/20 0417  WBC 4.9 3.8*  HGB 8.8* 8.0*  HCT 31.0* 28.1*  MCV 84.0 84.1  PLT 152 129*   No results for input(s): LABPROT, INR in the last 72 hours.    Assessment: Iron-deficiency anemia, improving -EGD 04/17/20: Mild portal hypertensive gastropathy was found in the entire examined stomach. Gastric  varices with no bleeding were found in the gastric fundus. There were no stigmata of recent bleeding. They were medium in largest diameter. -Hgb now 8.0, stable -Continues to have dark/black bowel movements, though he is on Senokot  Suspected hepatocellular carcinoma as seen on imaging -MRI yesterday revealed a lesion within the anterolateral dome of liver segment 7/8 measuring 3.1 x 3.6 cm. Technically incompletely characterized without IV contrast. However, based upon the enhancement characteristics from CT dated 04/20/2020 and review of more remote imaging including abdominal sonogram from 12/19/2017 and CT AP from 02/17/2014 findings are compatible with a LR-4 lesion, probably hepatocellular carcinoma.  Exam limited by movement/patient did not hold his breath at appropriate times.   -AFP, CEA, CA 19-9 all within normal limits  Cirrhosis: LFTs normal.  T bili 0.9/AST 22/ALT 16/ALP 99  Plan: Hospital team discharging patient today.  IR evaluated patient yesterday and recommends outpatient follow-up with possible localized therapy.  Continue Protonix 40 mg p.o. daily as an outpatient.  We will arrange outpatient follow up for cirrhosis.  Salley Slaughter PA-C 04/23/2020, 12:29 PM  Contact #  [phone]

## 2020-04-23 NOTE — Care Management Important Message (Signed)
Important Message  Patient Details  Name: ETAI COPADO MRN: [id number] Date of Birth: February 15, 1946   Medicare Important Message Given:  Yes     [name] 04/23/2020, 10:30 AM

## 2020-04-24 ENCOUNTER — Other Ambulatory Visit: Payer: Self-pay | Admitting: Gastroenterology

## 2020-04-24 ENCOUNTER — Other Ambulatory Visit: Payer: Self-pay | Admitting: Physician Assistant

## 2020-04-24 DIAGNOSIS — C220 Liver cell carcinoma: Secondary | ICD-10-CM

## 2020-04-25 ENCOUNTER — Other Ambulatory Visit: Payer: Self-pay | Admitting: Internal Medicine

## 2020-04-30 ENCOUNTER — Inpatient Hospital Stay: Payer: Medicare HMO | Admitting: Internal Medicine

## 2020-05-06 ENCOUNTER — Encounter: Payer: Self-pay | Admitting: Internal Medicine

## 2020-05-06 ENCOUNTER — Other Ambulatory Visit: Payer: Self-pay | Admitting: Interventional Radiology

## 2020-05-06 ENCOUNTER — Ambulatory Visit (INDEPENDENT_AMBULATORY_CARE_PROVIDER_SITE_OTHER): Payer: Medicare HMO | Admitting: Internal Medicine

## 2020-05-06 ENCOUNTER — Other Ambulatory Visit: Payer: Self-pay

## 2020-05-06 ENCOUNTER — Ambulatory Visit
Admission: RE | Admit: 2020-05-06 | Discharge: 2020-05-06 | Disposition: A | Discharge status: Home / Self Care | Payer: Medicare HMO | Source: Ambulatory Visit | Attending: Gastroenterology | Admitting: Gastroenterology

## 2020-05-06 VITALS — BP 142/66 | HR 74 | Temp 98.2°F | Resp 16 | Ht 68.0 in | Wt 231.0 lb

## 2020-05-06 DIAGNOSIS — E118 Type 2 diabetes mellitus with unspecified complications: Secondary | ICD-10-CM

## 2020-05-06 DIAGNOSIS — R160 Hepatomegaly, not elsewhere classified: Secondary | ICD-10-CM

## 2020-05-06 DIAGNOSIS — Z0001 Encounter for general adult medical examination with abnormal findings: Secondary | ICD-10-CM

## 2020-05-06 DIAGNOSIS — D500 Iron deficiency anemia secondary to blood loss (chronic): Secondary | ICD-10-CM | POA: Diagnosis not present

## 2020-05-06 DIAGNOSIS — C220 Liver cell carcinoma: Secondary | ICD-10-CM

## 2020-05-06 DIAGNOSIS — N1831 Chronic kidney disease, stage 3a: Secondary | ICD-10-CM | POA: Diagnosis not present

## 2020-05-06 DIAGNOSIS — K7689 Other specified diseases of liver: Secondary | ICD-10-CM | POA: Diagnosis not present

## 2020-05-06 DIAGNOSIS — D649 Anemia, unspecified: Secondary | ICD-10-CM

## 2020-05-06 DIAGNOSIS — D696 Thrombocytopenia, unspecified: Secondary | ICD-10-CM | POA: Diagnosis not present

## 2020-05-06 DIAGNOSIS — E1122 Type 2 diabetes mellitus with diabetic chronic kidney disease: Secondary | ICD-10-CM | POA: Diagnosis not present

## 2020-05-06 DIAGNOSIS — Z0000 Encounter for general adult medical examination without abnormal findings: Secondary | ICD-10-CM

## 2020-05-06 DIAGNOSIS — R69 Illness, unspecified: Secondary | ICD-10-CM | POA: Diagnosis not present

## 2020-05-06 HISTORY — PX: IR RADIOLOGIST EVAL & MGMT: IMG5224

## 2020-05-06 NOTE — Progress Notes (Signed)
Subjective:  Patient ID: [name], male    DOB: 12/12/1945  Age: 74 y.o. MRN: [id number]  CC: Annual Exam, Anemia, and Diabetes  This visit occurred during the SARS-CoV-2 public health emergency.  Safety protocols were in place, including screening questions prior to the visit, additional usage of staff PPE, and extensive cleaning of exam room while observing appropriate contact time as indicated for disinfecting solutions.   NEW TO ME  HPI [name] presents for a CPX and admission f/up - He was recently admitted for upper GI bleed  due to gastric varices and cirrhosis with hepatocellular carcinoma.  He complains of fatigue, weight loss, lower extremity edema, and shortness of breath.    Recommendations for Outpatient Follow-Up:    Follow up with your primary care provider in one week.   Check CBC, BMP, magnesium in the next visit  Follow-up with interventional radiology for definite management of your liver mass (office to schedule follow-up plan)  Follow-up with GI as outpatient after IR evaluation and treatment.   Discharge Diagnosis:   Principal Problem:   Symptomatic anemia Active Problems:   Type 2 diabetes with complication (HCC)   OSA (obstructive sleep apnea)   Coronary artery disease involving native coronary artery of native heart without angina pectoris   Hyperlipidemia with target LDL less than 70   Hepatic cirrhosis (HCC)   Acute on chronic diastolic CHF (congestive heart failure) ([place])    Discharge Condition: Improved.    Outpatient Medications Prior to Visit  Medication Sig Dispense Refill  . Accu-Chek Softclix Lancets lancets USE AS DIRECTED TO TEST BLOOD SUGAR FOUR TIMES DAILY (Patient taking differently: 1 each by Other route in the morning, at noon, in the evening, and at bedtime. ) 300 each 3  . albuterol (VENTOLIN HFA) 108 (90 Base) MCG/ACT inhaler Inhale 2 puffs into the lungs every 6 (six) hours as needed for wheezing or  shortness of breath. 8 g 3  . atorvastatin (LIPITOR) 40 MG tablet Take 1 tablet (40 mg total) by mouth daily at 6 PM. 30 tablet 3  . blood glucose meter kit and supplies Dispense based on patient and insurance preference. Use up to four times daily as directed. (FOR ICD-10 E10.9, E11.9). (Patient taking differently: 1 each by Other route See admin instructions. Dispense based on patient and insurance preference. Use up to four times daily as directed. (FOR ICD-10 E10.9, E11.9).) 1 each 0  . ferrous sulfate 325 (65 FE) MG EC tablet Take 1 tablet (325 mg total) by mouth daily with breakfast. 90 tablet 3  . gabapentin (NEURONTIN) 100 MG capsule TAKE 1 CAPSULE(100 MG) BY MOUTH TWICE DAILY (Patient taking differently: Take 100 mg by mouth 2 (two) times daily. TAKE 1 CAPSULE(100 MG) BY MOUTH TWICE DAILY) 180 capsule 3  . nitroGLYCERIN (NITROSTAT) 0.4 MG SL tablet Place 1 tablet (0.4 mg total) under the tongue every 5 (five) minutes as needed for chest pain. 25 tablet 6  . pantoprazole (PROTONIX) 40 MG tablet Take 1 tablet (40 mg total) by mouth daily. 30 tablet 1  . polyethylene glycol (MIRALAX / GLYCOLAX) 17 g packet Take 17 g by mouth daily as needed for moderate constipation. 14 each 0  . spironolactone (ALDACTONE) 25 MG tablet Take 1 tablet (25 mg total) by mouth daily. 30 tablet 2  . torsemide (DEMADEX) 20 MG tablet Take 40 mg (2 pills) every morning and every afternoon except Mon, Wed, Fri afternoons take only 20 mg (1 pill)  360 tablet 3  . umeclidinium-vilanterol (ANORO ELLIPTA) 62.5-25 MCG/INH AEPB Inhale 1 puff into the lungs daily. 30 each 11  . glimepiride (AMARYL) 2 MG tablet TAKE 1 TABLET(2 MG) BY MOUTH DAILY BEFORE BREAKFAST 30 tablet 5  . metFORMIN (GLUCOPHAGE) 850 MG tablet Take 1 tablet (850 mg total) by mouth 2 (two) times daily with a meal. 180 tablet 3   No facility-administered medications prior to visit.    ROS Review of Systems  Constitutional: Positive for activity change,  appetite change, fatigue and unexpected weight change. Negative for fever.  HENT: Negative.  Negative for sinus pressure, sore throat and trouble swallowing.   Eyes: Negative for visual disturbance.  Respiratory: Positive for shortness of breath. Negative for cough, chest tightness and wheezing.   Cardiovascular: Positive for leg swelling. Negative for chest pain and palpitations.  Gastrointestinal: Positive for abdominal distention. Negative for abdominal pain, anal bleeding, blood in stool, constipation, diarrhea, nausea, rectal pain and vomiting.  Genitourinary: Negative.  Negative for difficulty urinating, dysuria and hematuria.  Musculoskeletal: Negative for arthralgias and back pain.  Skin: Negative.  Negative for color change and pallor.  Neurological: Negative.  Negative for dizziness, weakness, light-headedness and numbness.  Hematological: Negative.  Negative for adenopathy. Does not bruise/bleed easily.  Psychiatric/Behavioral: Positive for confusion and decreased concentration. Negative for agitation, behavioral problems and dysphoric mood. The patient is not nervous/anxious.     Objective:  BP (!) 142/66   Pulse 74   Temp 98.2 F (36.8 C) (Oral)   Resp 16   Ht '5\' 8"'  (1.727 m)   Wt 231 lb (104.8 kg)   SpO2 99%   BMI 35.12 kg/m   BP Readings from Last 3 Encounters:  05/06/20 (!) 142/66  04/23/20 136/61  03/12/20 122/76    Wt Readings from Last 3 Encounters:  05/06/20 231 lb (104.8 kg)  04/23/20 238 lb 8 oz (108.2 kg)  03/12/20 242 lb (109.8 kg)    Physical Exam Constitutional:      General: He is not in acute distress.    Appearance: He is ill-appearing. He is not toxic-appearing or diaphoretic.  HENT:     Nose: Nose normal.     Mouth/Throat:     Mouth: Mucous membranes are pale.     Pharynx: Oropharynx is clear.  Eyes:     General: No scleral icterus.    Conjunctiva/sclera: Conjunctivae normal.  Cardiovascular:     Rate and Rhythm: Normal rate and  regular rhythm.     Heart sounds: Normal heart sounds, S1 normal and S2 normal. No murmur heard.   Pulmonary:     Effort: Pulmonary effort is normal.     Breath sounds: Examination of the right-lower field reveals rales. Examination of the left-lower field reveals rales. Rales present. No decreased breath sounds, wheezing or rhonchi.  Abdominal:     General: Abdomen is protuberant. Bowel sounds are normal. There is distension.     Palpations: Abdomen is soft. There is no hepatomegaly, splenomegaly or mass.     Tenderness: There is no abdominal tenderness. There is no guarding.  Musculoskeletal:     Right lower leg: 2+ Pitting Edema present.     Left lower leg: 2+ Pitting Edema present.  Skin:    General: Skin is warm and dry.     Coloration: Skin is pale.  Neurological:     General: No focal deficit present.     Mental Status: He is alert and oriented to person, place, and time.  Mental status is at baseline.  Psychiatric:        Attention and Perception: He is inattentive.        Mood and Affect: Mood normal.        Speech: Speech is tangential.        Behavior: Behavior is slowed. Behavior is not withdrawn. Behavior is cooperative.        Thought Content: Thought content normal.        Cognition and Memory: Memory is impaired.        Judgment: Judgment normal.     Lab Results  Component Value Date   WBC 3.7 (L) 05/06/2020   HGB 8.8 (L) 05/06/2020   HCT 28.9 (L) 05/06/2020   PLT 179 05/06/2020   GLUCOSE 105 (H) 04/23/2020   CHOL 150 11/01/2019   TRIG 69 11/01/2019   HDL 38 (L) 11/01/2019   LDLCALC 98 11/01/2019   ALT 16 04/23/2020   AST 22 04/23/2020   NA 139 04/23/2020   K 4.0 04/23/2020   CL 103 04/23/2020   CREATININE 1.49 (H) 04/23/2020   BUN 19 04/23/2020   CO2 27 04/23/2020   TSH 2.761 12/28/2019   INR 1.1 04/15/2020   HGBA1C 5.0 05/06/2020   MICROALBUR 1.6 05/06/2020    No results found.  Assessment & Plan:   [name] was seen today for annual exam,  anemia and diabetes.  Diagnoses and all orders for this visit:  Symptomatic anemia- His H&H and iron level remains low.  I recommended that he receive a series of iron infusions. -     CBC with Differential/Platelet; Future -     Vitamin B1; Future -     CBC with Differential/Platelet -     Vitamin B1  Iron deficiency anemia due to chronic blood loss- See above. -     CBC with Differential/Platelet; Future -     Iron; Future -     Ferritin; Future -     CBC with Differential/Platelet -     Ferritin -     Iron  Thrombocytopenia (HCC)- His platelet count is normal now.  This is likely related to cirrhosis. -     CBC with Differential/Platelet; Future -     Vitamin B1; Future -     CBC with Differential/Platelet -     Vitamin B1   Stage 3a chronic kidney disease-his creatinine clearance is stable at 51.1.  He will avoid nephrotoxic agents. -     Consult to [facility] -     Urinalysis, Routine w reflex microscopic; Future -     Microalbumin / creatinine urine ratio; Future -     Microalbumin / creatinine urine ratio -     Urinalysis, Routine w reflex microscopic  Type 2 diabetes with complication ([place])- His H8I is down to 5.0%.  I recommended that he discontinue Metformin and the sulfonylurea. -     Hemoglobin A1c; Future -     Microalbumin / creatinine urine ratio; Future -     Microalbumin / creatinine urine ratio -     Hemoglobin A1c  Routine general medical examination at a health care facility- Exam completed, labs reviewed, he refused vaccines against influenza and pneumonia, no cancer screenings are indicated due to low life expectancy, patient education material was given.  Other orders -     CBC MORPHOLOGY   I have discontinued [name]'s metFORMIN and glimepiride. I am also having him maintain his nitroGLYCERIN, gabapentin, atorvastatin,  spironolactone, ferrous sulfate, torsemide, albuterol, Anoro Ellipta, blood glucose meter kit and supplies, Accu-Chek  Softclix Lancets, pantoprazole, and polyethylene glycol.  No orders of the defined types were placed in this encounter.    Follow-up: Return in about 4 weeks (around 06/03/2020).  Scarlette Calico, MD

## 2020-05-06 NOTE — Consult Note (Signed)
Chief Complaint: Liver mass  Referring Physician(s): Outlaw,William  History of Present Illness: [name] is a 74 y.o. male presenting as a scheduled consultation to [place] clinic today, kindly referred by Dr. [name] of [facility] GI service, for evaluation of a right liver mass.   Mr Welshans joins [place] today by telemedicine visit, given the current COVID crisis. We confirmed his identity with 2 personal identifiers, and his son, [name], was on the phone and provided the majority of interval history/information.   We first met Mr Wenzler as inpatient at [facility].  He was admitted 8/18 via the ED for complaints of lower extremity swelling and SOB.  His work-up uncovered hgb of 5.9, and +occult blood in the stool. Given the anemia, known cirrhosis/risk factors, findings of inpt endoscopy, and history of prior GI bleed, BRTO CT was then performed revealing incidental right liver mass.  MRI + contrast was recommended.   MRI was performed as inpt, but without contrast.  The mass was designated as LR-4 lesion.  AFP was also performed, normal level of 1.3.    Inpt endoscopy was performed revealing no sign of recent hemorrhage, and confirming gastric varices.  No esophageal varices were identified.    His son confirms that he has not had any acute events as outpatient.  He remains very active, and is "always busy".  He is essentially asymptomatic from standpoint of his liver, with estimated ECOG of 0.    His son tells me that he needs to follow up with [facility] to figure out his next appointment.   The CT and MRI imaging show a right liver mass, slightly exophytic at the dome, immediately adjacent to the diaphragm.  The estimated size is 3.1cm x 3.6cm.  He has cirrhotic changes of the liver.     Past Medical History:  Diagnosis Date   Arthritis    "joints tighten up sometimes" (03/27/2104)   CHF (congestive heart failure) (HCC)    Chronic lower back pain    Coronary artery disease    a. 05/2012  Cath/PCI: LM 40ost, 50-60d, LAD 99p ruptured plaque (3.0x28 DES), LCX 50p/m, RCA 30-40p, 25m EF 65-70%.   Coronary artery disease involving native coronary artery of native heart without angina pectoris    Severe left main disease at catheterization July 2015  CABG x3 with a LIMA to the LAD, SVG to the OM, SVG to the PDA on 03/31/14. EF 60% by cath.    GERD without esophagitis 08/04/2010   Hepatic cirrhosis [place]    a. Dx 01/2014 - CT a/p    History of blood transfusion    "related to bleeding ulcers"   History of concussion    1976--  NO RESIDUAL   History of GI bleed    a. UGIB 07/2012;  b. 01/2014 admission with GIB/FOB stool req 1U prbc's->EGD showed portal gastropathy, barrett's esoph, and chronic active h. pylori gastritis.   History of gout    2007 &  2008  LEFT LEG-- NO ISSUE SINCE   Hyperlipidemia    Iron deficiency anemia    Kidney stones    OA (osteoarthritis of spine)    LOWER BACK--  INTERMITTANT LEFT LEG NUMBNESS   OSA (obstructive sleep apnea)    PULMOLOGIST-  DR CLANCE--  MODERATE OSA  STARTED CPAP 2012--  BUT CURRENTLY HAS NOT USED PAST 6 MONTHS   Phimosis    a. s/p circumcision 2015.   Type 2 diabetes mellitus (HCC)    Unspecified  essential hypertension     Past Surgical History:  Procedure Laterality Date   ANKLE FRACTURE SURGERY Right 1989   "plate put in"   APPENDECTOMY  05-16-2004   open   CIRCUMCISION N/A 09/09/2013   Procedure: CIRCUMCISION ADULT;  Surgeon: Bernestine Amass, MD;  Location: [facility];  Service: Urology;  Laterality: N/A;   COLECTOMY  05-16-2004   CORONARY ANGIOPLASTY WITH STENT PLACEMENT  06/28/2012  DR COOPER   PCI W/  X1 DES to [place]. LAD/  LM  40% OSTIAL & 50-60% DISTAL /  50% PROX LCX/  30-40% PROX RCA & 50% MID RCA/   LVEF 65-70%   CORONARY ARTERY BYPASS GRAFT N/A 03/31/2014   Procedure: CORONARY ARTERY BYPASS GRAFTING (CABG) times 3 using left internal mammary artery and right saphenous vein.;  Surgeon:  Melrose Nakayama, MD;  Location: [place];  Service: Open Heart Surgery;  Laterality: N/A;   DOBUTAMINE STRESS ECHO  06-08-2012   MODERATE HYPOKINESIS/ ISCHEMIA MID INFERIOR WALL   ESOPHAGOGASTRODUODENOSCOPY  08/15/2012   Procedure: ESOPHAGOGASTRODUODENOSCOPY (EGD);  Surgeon: Wonda Horner, MD;  Location: [facility] ENDOSCOPY;  Service: Endoscopy;  Laterality: N/A;   ESOPHAGOGASTRODUODENOSCOPY N/A 02/17/2014   Procedure: ESOPHAGOGASTRODUODENOSCOPY (EGD);  Surgeon: Jeryl Columbia, MD;  Location: [facility] ENDOSCOPY;  Service: Endoscopy;  Laterality: N/A;   ESOPHAGOGASTRODUODENOSCOPY N/A 04/17/2020   Procedure: ESOPHAGOGASTRODUODENOSCOPY (EGD);  Surgeon: Ronnette Juniper, MD;  Location: [place];  Service: Gastroenterology;  Laterality: N/A;   ESOPHAGOGASTRODUODENOSCOPY (EGD) WITH PROPOFOL N/A 09/30/2017   Procedure: ESOPHAGOGASTRODUODENOSCOPY (EGD) WITH PROPOFOL;  Surgeon: Wonda Horner, MD;  Location: [facility] ENDOSCOPY;  Service: Endoscopy;  Laterality: N/A;   ESOPHAGOGASTRODUODENOSCOPY (EGD) WITH PROPOFOL N/A 12/20/2017   Procedure: ESOPHAGOGASTRODUODENOSCOPY (EGD) WITH PROPOFOL;  Surgeon: Clarene Essex, MD;  Location: [place];  Service: Endoscopy;  Laterality: N/A;   HERNIA REPAIR     INTRAOPERATIVE TRANSESOPHAGEAL ECHOCARDIOGRAM N/A 03/31/2014   Procedure: INTRAOPERATIVE TRANSESOPHAGEAL ECHOCARDIOGRAM;  Surgeon: Melrose Nakayama, MD;  Location: [place];  Service: Open Heart Surgery;  Laterality: N/A;   LAPAROSCOPIC UMBILICAL HERNIA REPAIR W/ MESH  06-06-2011   LEFT HEART CATHETERIZATION WITH CORONARY ANGIOGRAM N/A 03/28/2014   Procedure: LEFT HEART CATHETERIZATION WITH CORONARY ANGIOGRAM;  Surgeon: Sinclair Grooms, MD;  Location: [facility] CATH LAB;  Service: Cardiovascular;  Laterality: N/A;   LIPOMA EXCISION Left 08/25/2017   Procedure: EXCISION LIPOMA LEFT POSTERIOR THIGH;  Surgeon: Clovis Riley, MD;  Location: [place];  Service: General;  Laterality: Left;   NEPHROLITHOTOMY  1990'S   OPEN  APPENDECTOMY W/ PARTIAL CECECTOMY  05-16-2004   PERCUTANEOUS CORONARY STENT INTERVENTION (PCI-S) N/A 06/28/2012   Procedure: PERCUTANEOUS CORONARY STENT INTERVENTION (PCI-S);  Surgeon: Sherren Mocha, MD;  Location: [facility] CATH LAB;  Service: Cardiovascular;  Laterality: N/A;    Allergies: Patient has no known allergies.  Medications: Prior to Admission medications   Medication Sig Start Date End Date Taking? Authorizing Provider  Accu-Chek Softclix Lancets lancets USE AS DIRECTED TO TEST BLOOD SUGAR FOUR TIMES DAILY Patient taking differently: 1 each by Other route in the morning, at noon, in the evening, and at bedtime.  03/13/20   [name], MD  albuterol (VENTOLIN HFA) 108 (90 Base) MCG/ACT inhaler Inhale 2 puffs into the lungs every 6 (six) hours as needed for wheezing or shortness of breath. 03/12/20   [name], MD  atorvastatin (LIPITOR) 40 MG tablet Take 1 tablet (40 mg total) by mouth daily at 6 PM. 01/04/20   Nita Sells, MD  blood glucose meter kit and supplies Dispense based on patient and insurance preference. Use up to four times daily as directed. (FOR ICD-10 E10.9, E11.9). Patient taking differently: 1 each by Other route See admin instructions. Dispense based on patient and insurance preference. Use up to four times daily as directed. (FOR ICD-10 E10.9, E11.9). 03/12/20   [name], MD  ferrous sulfate 325 (65 FE) MG EC tablet Take 1 tablet (325 mg total) by mouth daily with breakfast. 01/16/20   [name], MD  gabapentin (NEURONTIN) 100 MG capsule TAKE 1 CAPSULE(100 MG) BY MOUTH TWICE DAILY Patient taking differently: Take 100 mg by mouth 2 (two) times daily. TAKE 1 CAPSULE(100 MG) BY MOUTH TWICE DAILY 05/14/19   [name], MD  nitroGLYCERIN (NITROSTAT) 0.4 MG SL tablet Place 1 tablet (0.4 mg total) under the tongue every 5 (five) minutes as needed for chest pain. 06/08/16   Nahser, Wonda Cheng, MD  pantoprazole  (PROTONIX) 40 MG tablet Take 1 tablet (40 mg total) by mouth daily. 04/23/20   Pokhrel, Corrie Mckusick, MD  polyethylene glycol (MIRALAX / GLYCOLAX) 17 g packet Take 17 g by mouth daily as needed for moderate constipation. 04/23/20   Pokhrel, Corrie Mckusick, MD  spironolactone (ALDACTONE) 25 MG tablet Take 1 tablet (25 mg total) by mouth daily. 01/04/20   Nita Sells, MD  torsemide (DEMADEX) 20 MG tablet Take 40 mg (2 pills) every morning and every afternoon except Mon, Wed, Fri afternoons take only 20 mg (1 pill) 02/06/20   Nahser, Wonda Cheng, MD  umeclidinium-vilanterol (ANORO ELLIPTA) 62.5-25 MCG/INH AEPB Inhale 1 puff into the lungs daily. 03/12/20   [name], MD     Family History  Problem Relation Age of Onset   Lung cancer Sister    Cancer Sister        lung   Cancer Mother    Cancer Father        died in his 56s.   Cancer Brother        lung   Coronary artery disease Other    Diabetes Other    Colon cancer Other    Cancer Sister        lung    Social History   Socioeconomic History   Marital status: Married    Spouse name: Not on file   Number of children: Not on file   Years of education: Not on file   Highest education level: Not on file  Occupational History   Not on file  Tobacco Use   Smoking status: Former Smoker    Packs/day: 2.00    Years: 40.00    Pack years: 80.00    Types: Cigarettes    Quit date: 08/29/1996    Years since quitting: 23.7   Smokeless tobacco: Never Used  Vaping Use   Vaping Use: Never used  Substance and Sexual Activity   Alcohol use: Yes    Alcohol/week: 8.0 standard drinks    Types: 8 Cans of beer per week    Comment: 2 beers every other day   Drug use: No   Sexual activity: Yes  Other Topics Concern   Not on file  Social History Narrative   Lives in [place] with wife, son, and son's family.   Social Determinants of Health   Financial Resource Strain:    Difficulty of Paying Living Expenses: Not on file    Food Insecurity:    Worried About [place] in the Last Year:  Not on file   Ran Out of Food in the Last Year: Not on file  Transportation Needs:    Lack of Transportation (Medical): Not on file   Lack of Transportation (Non-Medical): Not on file  Physical Activity:    Days of Exercise per Week: Not on file   Minutes of Exercise per Session: Not on file  Stress:    Feeling of Stress : Not on file  Social Connections:    Frequency of Communication with Friends and Family: Not on file   Frequency of Social Gatherings with Friends and Family: Not on file   Attends Religious Services: Not on file   Active Member of Clubs or Organizations: Not on file   Attends Archivist Meetings: Not on file   Marital Status: Not on file    ECOG Status: 0 - Asymptomatic  Review of Systems  Review of Systems: A 12 point ROS discussed and pertinent positives are indicated in the HPI above.  All other systems are negative.  Physical Exam No direct physical exam was performed (except for noted visual exam findings with Video Visits).    Vital Signs: There were no vitals taken for this visit.  Imaging: DG Chest 2 View  Result Date: 04/15/2020 CLINICAL DATA:  74 year old male with shortness of breath. EXAM: CHEST - 2 VIEW COMPARISON:  Chest radiograph dated 12/26/2019. FINDINGS: There is mild cardiomegaly and mild vascular congestion and edema. Pneumonia is not excluded clinical correlation is recommended. No focal consolidation, pleural effusion, pneumothorax. Median sternotomy wires and CABG vascular clips. Atherosclerotic calcification of the aorta. No acute osseous pathology. IMPRESSION: Mild cardiomegaly with mild vascular congestion and edema. No focal consolidation. Electronically Signed   By: Anner Crete M.D.   On: 04/15/2020 16:28   MR ABDOMEN WO CONTRAST  Result Date: 04/22/2020 CLINICAL DATA:  Cirrhosis and portal venous hypertension with new liver  lesion identified on CT from 04/20/2020. EXAM: MRI ABDOMEN WITHOUT CONTRAST TECHNIQUE: Multiplanar multisequence MR imaging was performed without the administration of intravenous contrast. COMPARISON:  CT AP 04/20/2020. FINDINGS: Exam detail is diminished secondary to motion artifact. Further, evaluation for solid organ pathology may be limited by lack of IV contrast material. Lower chest: Bilateral pleural effusions are identified right greater than left. Hepatobiliary: Morphologic features of cirrhosis identified. Diffuse hepatic steatosis identified. Corresponding to the CT abnormality is a lesion within anterolateral dome of liver (segment 7/8) measuring 3.1 x 3.6 cm, image 10/9. Lesion appears well-circumscribed and slightly exophytic. This shows increased signal on the diffusion-weighted images. On CT from 04/20/2020 there is washout associated with this lesion on the portal venous phase images. Further, this was not seen on previous abdominal sonogram from 12/19/2017 and CT AP from 02/17/2014. No additional liver lesions identified. Mild gallbladder wall edema is nonspecific in the setting of cirrhosis and portal venous hypertension. No bile duct dilatation. Pancreas: No mass, inflammatory changes, or other parenchymal abnormality identified. Spleen: Splenomegaly is again noted with a cranial caudal dimension of 13.8 cm. 1.3 cm T2 hyperintense foci within the inferior spleen is unchanged from remote CT dated 02/17/2014. Within the central spleen there is a smaller T2 hyperintense structure measuring 0.9 cm. Not confidently identified previously additionally, there is a 8 mm T2 hyperintense structure within the superior spleen. Not seen previously. Adrenals/Urinary Tract: Normal appearance of the adrenal glands. Asymmetric atrophy of the right kidney. Stone within the upper pole of right kidney is again noted measuring 7 mm. Within the left mid kidney there is  a 1.5 x 1.0 cm cyst. Incompletely characterized  without IV contrast. No hydronephrosis identified bilaterally. Stomach/Bowel: Visualized portions within the abdomen are unremarkable. Vascular/Lymphatic: Aortic atherosclerosis. No aneurysm. No upper abdominal adenopathy small gastroesophageal and gastric fundus varices. Other:  Trace ascites identified. Musculoskeletal: No acute abnormality. Vertebral hemangiomas identified within the lumbar spine. IMPRESSION: 1. Exam detail is diminished secondary to motion artifact and lack of IV contrast material. 2. Corresponding to the CT abnormality is a lesion within the anterolateral dome of liver segment 7/8 measuring 3.1 x 3.6 cm. Technically incompletely characterized without IV contrast. However, based upon the enhancement characteristics from CT dated 04/20/2020 and review of more remote imaging including abdominal sonogram from 12/19/2017 and CT AP from 02/17/2014 findings are compatible with a LR-4 lesion, probably hepatocellular carcinoma. When the patient is clinically stable and able to follow directions and hold their breath (preferably as an outpatient) further evaluation with dedicated liver protocol abdominal MRI without and with contrast should be considered. 3. Morphologic features of cirrhosis with stigmata of portal venous hypertension including splenomegaly, trace ascites, and small gastroesophageal and gastric fundus varices. 4. Small T2 hyperintense lesions within the spleen are unchanged from remote CT dated 02/17/2014. These are favored to be benign in etiology. 5. Bilateral pleural effusions are identified right greater than left. Electronically Signed   By: Kerby Moors M.D.   On: 04/22/2020 06:01   CT ABDOMEN PELVIS W CONTRAST  Result Date: 04/20/2020 CLINICAL DATA:  Gastric varices, BRTO protocol EXAM: CT ABDOMEN AND PELVIS WITH CONTRAST TECHNIQUE: Multidetector CT imaging of the abdomen and pelvis was performed using the standard protocol following bolus administration of intravenous  contrast. CONTRAST:  149m OMNIPAQUE IOHEXOL 350 MG/ML SOLN COMPARISON:  CT abdomen pelvis, 02/17/2014 FINDINGS: Lower chest: Moderate right, small left pleural effusions and associated atelectasis or consolidation. Hepatobiliary: Coarse, nodular contour of the liver. Hypoenhancing subcapsular lesion of the anterior liver dome, hepatic segment VII, measuring 4.0 x 3.1 cm (series 8, image 23). No gallstones, gallbladder wall thickening, or biliary dilatation. Pancreas: Unremarkable. No pancreatic ductal dilatation or surrounding inflammatory changes. Spleen: Splenomegaly, maximum coronal span 15.4 cm. Adrenals/Urinary Tract: Adrenal glands are unremarkable. Asymmetric atrophy of the right kidney. No hydronephrosis. Nonobstructive calculus in the anterior midportion of the right kidney. The left kidney is normal. Bladder is unremarkable. Stomach/Bowel: Stomach is within normal limits. Appendix is surgically absent. No evidence of bowel wall thickening, distention, or inflammatory changes. Sigmoid diverticulosis. Vascular/Lymphatic: Aortic atherosclerosis. Incidental note of variant anatomy of the celiac trunk, with the right gastric artery arising from the gastroduodenal artery. There is probable high-grade atherosclerotic stenosis of the origin of the right renal artery (series 4, image 58). Small gastroesophageal and gastric fundus varices. No enlarged abdominal or pelvic lymph nodes. Reproductive: No mass or other significant abnormality. Other: No abdominal wall hernia or abnormality. No abdominopelvic ascites. Musculoskeletal: No acute or significant osseous findings. IMPRESSION: 1. No findings to localize GI bleeding. No intraluminal contrast identified. 2. Stigmata of cirrhosis and portal hypertension. 3. Small gastroesophageal and gastric fundus varices. 4. Hypoenhancing subcapsular lesion of the anterior liver dome, hepatic segment VII, measuring 4.0 x 3.1 cm. This lesion is new compared to CT dated  02/17/2014 and although not typical in contrast enhancement characteristics is nonetheless suspicious for hepatocellular carcinoma in the setting of cirrhosis. Recommend multiphasic contrast enhanced MRI to further characterize on a nonemergent basis when clinically appropriate. 5. Moderate right, small left pleural effusions and associated atelectasis or consolidation. 6. Asymmetric atrophy of the  right kidney, which is markedly progressed compared to prior examination dated 2015. Nonobstructive right nephrolithiasis. There is probable high-grade atherosclerotic stenosis of the origin of the right renal artery which likely explains renal atrophy. 7. Aortic Atherosclerosis (ICD10-I70.0). Electronically Signed   By: Eddie Candle M.D.   On: 04/20/2020 13:59    Labs:  CBC: Recent Labs    04/20/20 0617 04/21/20 0840 04/22/20 0953 04/23/20 0417  WBC 4.1 4.2 4.9 3.8*  HGB 7.3* 8.0* 8.8* 8.0*  HCT 26.6* 28.4* 31.0* 28.1*  PLT 126* 140* 152 129*    COAGS: Recent Labs    11/01/19 0603 01/03/20 0518 04/15/20 2102  INR 1.2 1.2 1.1    BMP: Recent Labs    04/20/20 0617 04/21/20 0840 04/22/20 0953 04/23/20 0417  NA 137 136 137 139  K 4.3 4.4 4.4 4.0  CL 101 104 103 103  CO2 '25 25 23 27  ' GLUCOSE 80 135* 190* 105*  BUN '13 12 16 19  ' CALCIUM 9.4 9.1 9.1 8.7*  CREATININE 1.18 1.20 1.27* 1.49*  GFRNONAA >60 59* 55* 46*  GFRAA >60 >60 >60 53*    LIVER FUNCTION TESTS: Recent Labs    04/17/20 0439 04/18/20 0213 04/21/20 0840 04/23/20 0417  BILITOT 1.7* 1.5* 1.2 0.9  AST '25 22 22 22  ' ALT '13 16 15 16  ' ALKPHOS 90 94 115 99  PROT 6.1* 6.1* 6.7 6.2*  ALBUMIN 3.2* 3.2* 3.4* 3.2*    TUMOR MARKERS: No results for input(s): AFPTM, CEA, CA199, CHROMGRNA in the last 8760 hours.  Assessment and Plan:  Mr. Majka is a 74 yo male with ETOH related cirrhosis, recent admission for SOB and incidentally discovered right liver mass, ~3.6cm.   At this point, we have non-contrast MRI  designating this as LR-4 lesion, which confers ~70%-95% confidence of [place].    Presuming HCC, his ECOG is 0 and thus his BCLC is Intermediate Stage.   I had a lengthy discussion with his son about the anatomy/pathology of [place], risk factor for [facility] which is the cirrhosis, and the natural history.  We also discussed briefly possible treatments, which, at this stage, are traditionally surgical or liver directed therapy (locoregional).    I did emphasize that establishing diagnosis is important, which is often done simply by contrast MRI vs biopsy.  After discussion of the risk of biopsy vs contrast MRI, he and his son would prefer to do the contrast MRI as initial step in attempt to avoid a biopsy.  I did explain that sometimes a biopsy is necessary.  He understands and would like to attempt imaging diagnosis, anticipating treatment.    We will schedule for elective contrast MRI, and then follow up with the results in the clinic to discuss possible treatments.    I have encouraged him to follow up with his GI office.   Plan: - MRI abdomen without and with contrast, liver protocol, for right liver mass.  - once complete, we will follow up in the office to discuss the result and possible treatments - I will reach out to hepatobiliary surgery team to discuss a possible candidacy for surgery, presuming this is [place].  Thank you for this interesting consult.  I greatly enjoyed meeting THEO KRUMHOLZ and look forward to participating in their care.  A copy of this report was sent to the requesting provider on this date.  Electronically Signed: Corrie Mckusick 05/06/2020, 11:30 AM   I spent a total of  40 Minutes   in  remote  clinical consultation, greater than 50% of which was counseling/coordinating care for right liver mass, possible HCC, possible liver directed therapy.    Visit type: Audio only (telephone). Audio (no video) only due to patient's lack of internet/smartphone capability. Alternative for  in-person consultation at [facility], [address], [place], [place]. This visit type was conducted due to national recommendations for restrictions regarding the COVID-19 Pandemic (e.g. social distancing).  This format is felt to be most appropriate for this patient at this time.  All issues noted in this document were discussed and addressed.

## 2020-05-06 NOTE — Patient Instructions (Signed)

## 2020-05-07 ENCOUNTER — Other Ambulatory Visit: Payer: Self-pay

## 2020-05-07 NOTE — Patient Outreach (Signed)
[facility]) Care Management  05/07/2020  NICKI GRACY [birth year] [id number]   New referral: Source: MD office Reason: GI bleed  Reviewed medical record.  Placed call to patient with no answer and voicemail not set up.  PLAN: will mail unsuccessful outreach letter and call back in 3-4 days.  Tomasa Rand, RN, BSN, CEN [facility] [employer] [phone]

## 2020-05-08 ENCOUNTER — Encounter (HOSPITAL_COMMUNITY): Payer: Self-pay

## 2020-05-08 ENCOUNTER — Ambulatory Visit (HOSPITAL_COMMUNITY)
Admission: RE | Admit: 2020-05-08 | Discharge: 2020-05-08 | Disposition: A | Discharge status: Home / Self Care | Payer: Medicare HMO | Source: Ambulatory Visit | Attending: Interventional Radiology | Admitting: Interventional Radiology

## 2020-05-08 DIAGNOSIS — R160 Hepatomegaly, not elsewhere classified: Secondary | ICD-10-CM

## 2020-05-09 ENCOUNTER — Other Ambulatory Visit: Payer: Self-pay

## 2020-05-09 ENCOUNTER — Ambulatory Visit (HOSPITAL_COMMUNITY)
Admission: RE | Admit: 2020-05-09 | Discharge: 2020-05-09 | Disposition: A | Discharge status: Home / Self Care | Payer: Medicare HMO | Source: Ambulatory Visit | Attending: Interventional Radiology | Admitting: Interventional Radiology

## 2020-05-09 LAB — CBC WITH DIFFERENTIAL/PLATELET
Absolute Monocytes: 348 cells/uL (ref 200–950)
Basophils Absolute: 19 cells/uL (ref 0–200)
Basophils Relative: 0.5 %
Eosinophils Absolute: 107 cells/uL (ref 15–500)
Eosinophils Relative: 2.9 %
HCT: 28.9 % — ABNORMAL LOW (ref 38.5–50.0)
Hemoglobin: 8.8 g/dL — ABNORMAL LOW (ref 13.2–17.1)
Lymphs Abs: 622 cells/uL — ABNORMAL LOW (ref 850–3900)
MCH: 24.5 pg — ABNORMAL LOW (ref 27.0–33.0)
MCHC: 30.4 g/dL — ABNORMAL LOW (ref 32.0–36.0)
MCV: 80.5 fL (ref 80.0–100.0)
MPV: 11.2 fL (ref 7.5–12.5)
Monocytes Relative: 9.4 %
Neutro Abs: 2605 cells/uL (ref 1500–7800)
Neutrophils Relative %: 70.4 %
Platelets: 179 10*3/uL (ref 140–400)
RBC: 3.59 10*6/uL — ABNORMAL LOW (ref 4.20–5.80)
RDW: 21.7 % — ABNORMAL HIGH (ref 11.0–15.0)
Total Lymphocyte: 16.8 %
WBC: 3.7 10*3/uL — ABNORMAL LOW (ref 3.8–10.8)

## 2020-05-09 LAB — URINALYSIS, ROUTINE W REFLEX MICROSCOPIC
Bilirubin Urine: NEGATIVE
Glucose, UA: NEGATIVE
Hgb urine dipstick: NEGATIVE
Ketones, ur: NEGATIVE
Leukocytes,Ua: NEGATIVE
Nitrite: NEGATIVE
Protein, ur: NEGATIVE
Specific Gravity, Urine: 1.009 (ref 1.001–1.03)
pH: 7 (ref 5.0–8.0)

## 2020-05-09 LAB — HEMOGLOBIN A1C
Hgb A1c MFr Bld: 5 % of total Hgb (ref <5.7)
Mean Plasma Glucose: 97 (calc)
eAG (mmol/L): 5.4 (calc)

## 2020-05-09 LAB — MICROALBUMIN / CREATININE URINE RATIO
Creatinine, Urine: 23 mg/dL (ref 20–320)
Microalb Creat Ratio: 70 mcg/mg creat — ABNORMAL HIGH (ref <30)
Microalb, Ur: 1.6 mg/dL

## 2020-05-09 LAB — VITAMIN B1: Vitamin B1 (Thiamine): 17 nmol/L (ref 8–30)

## 2020-05-09 LAB — CBC MORPHOLOGY

## 2020-05-09 LAB — FERRITIN: Ferritin: 32 ng/mL (ref 24–380)

## 2020-05-09 LAB — IRON: Iron: 40 ug/dL — ABNORMAL LOW (ref 50–180)

## 2020-05-09 NOTE — Progress Notes (Signed)
Patient arrived for MRI on 9/11. Patient stated he was severely claustrophobic and needed anti anxiety medication for the MRI. Unfortunately, he arrived understanding that the MRI staff would have the medication for him. MRI technologist reviewed with him several times that he would need to call Dr Rickey Primus office to get him to order the pre-meds for the MRI. Patient is to call MD's office Monday for prescript for anti anxiety meds and to have office reschedule MRI. 1:19p

## 2020-05-10 ENCOUNTER — Encounter: Payer: Self-pay | Admitting: Cardiovascular Disease

## 2020-05-10 NOTE — Progress Notes (Signed)
Cardiology Office Note   Date:  05/11/2020   ID:  [name], DOB 11-22-1945, MRN [id number]  PCP:  [name], MD  Cardiologist:   Mertie Moores, MD   Chief Complaint  Patient presents with  . Coronary Artery Disease       Notes prior to 2014:   [name] is a 74 y.o. male who presents for follow up of his CAD   Expand All Collapse All      SHIGEO BAUGH Date of Birth[birth year]   [place] [address] [place], [place], [place], [place] [address] [phone]  Fax [phone]  Problem List: 1. CAD - 3.0 x 28 mm drug-eluting stent (06-28-12), s/p CABG  2. Indigestion 3. Anemia     [name] is a 74 yo with hx of indigestion / chest pain that develops with walking. The pain is a "ache" and lasts as long as he is walking. It causes him some dyspnea. It typically goes away when he stops. He has lots of belching. Hx of asthma. Hx of smoking in the past. No regular exercise. He is now retired. He gets a little exercise every day.  He had a dobutamine echo that revealed mild ischemia in the inferior wall.   He had a cardiac cath which revealed a tight irregular LAD stenosis. He had PTCA and stenting of his LAD by Dr. Burt Knack.  We have advised him to stop eating fried foods. He still is eating fried potatoes on a regular basis.   Jan 14, 2013:  [name] has done well from a cardiac standpoint. He is having some problems with anemia. He was scheduled have a colonoscopy tomorrow but I did not want to stop the Effient and aspirin. The colonoscopy was canceled.  Nov. 17, 2014:  [name] is doing well. He is now a year out from his stenting and  can stop the Effient for his colonoscopy. He does some exercise. He has completely ignored my dietary suggestions from previous visits. Still eats everything that he wants to. Having some abdominal pains.   Oct. 28, 2015:  [name] is doing OK. He's had a hospital admission on July 31 and was found have severe left main disease as well as severe coronary disease. He had coronary artery bypass grafting on August 3. (CABG)X3 LIMA-LAD; SVG-OM; SVG-PD  Still has some chest soreness     Jan. 28, 2016:  [name] is doing well .  No CP . A bit or soreness from shoveling snow.  No angina while shoveling , just chest wall soreness.  The pain is on the left upper chest. Tender to palpitation. Not related to walking.  Has been present for 4-5 days ( since it snowed) not associated with dyspnea , sweats, or dizziness  No dyspnea.   Trying to stick to his diet. Not as successful during the holidays.   February 27, 2015: No angina. Has some chest wall tenderness - worse with coughing . Still leg edema   Oct. 11, 2016:  Had CABG August , 2015 Still having some MSK pain in his chest .  No angina   Staying pretty active.  Is retired.  Does lots of activities around the house and yard.   December 08, 2015:  [name] was seen today for follow up .  Hx of CABG BP and HR are well controlled.  Able to do all of his normal activities  October 09, 2017:  [name] is seen back today for follow-up of his coronary artery disease, hypertension, hyperlipidemia.  He was recently admitted to the hospital for a GI bleed on September 29, 2017. Hb was 5.6 on admission   Has been avoiding salty foods.   March 11, 2020: [name] is seen today for follow up of his CAD, HLD,HTN  No questions Feet are swollen - has been eating more salt than usual  Sept. 13, 2021: [name] is seen for follow up of his CAD, HLD, HTN He was admitted with worsening dyspnea and lew swelling on Aug. 18, 2021: He has hem _ stools EGD - showed  gastric varices They did not get an echo in the hospital  Breathing is good.  Is active,   Does not walk much     Past Medical History:  Diagnosis Date  . Arthritis    "joints tighten up sometimes" (03/27/2104)  . CHF (congestive heart failure) ([place])   . Chronic lower back pain   . Coronary artery disease    a. 05/2012 Cath/PCI: LM 40ost, 50-60d, LAD 99p ruptured plaque (3.0x28 DES), LCX 50p/m, RCA 30-40p, 30m EF 65-70%.  . Coronary artery disease involving native coronary artery of native heart without angina pectoris    Severe left main disease at catheterization July 2015  CABG x3 with a LIMA to the LAD, SVG to the OM, SVG to the PDA on 03/31/14. EF 60% by cath.   . GERD without esophagitis 08/04/2010  . Hepatic cirrhosis [place]    a. Dx 01/2014 - CT a/p   . History of blood transfusion    "related to bleeding ulcers"  . History of concussion    1976--  NO RESIDUAL  . History of GI bleed    a. UGIB 07/2012;  b. 01/2014 admission with GIB/FOB stool req 1U prbc's->EGD showed portal gastropathy, barrett's esoph, and chronic active h. pylori gastritis.  .Marland KitchenHistory of gout    2007 &  2008  LEFT LEG-- NO ISSUE SINCE  . Hyperlipidemia   . Iron deficiency anemia   . Kidney stones   . OA (osteoarthritis of spine)    LOWER BACK--  INTERMITTANT LEFT LEG NUMBNESS  . OSA (obstructive sleep apnea)    PULMOLOGIST-  DR CLANCE--  MODERATE OSA  STARTED CPAP 2012--  BUT CURRENTLY HAS NOT USED PAST 6 MONTHS  . Phimosis    a. s/p circumcision 2015.  . Type 2 diabetes mellitus ([place]   . Unspecified essential hypertension     Past Surgical History:  Procedure Laterality Date  . ANKLE FRACTURE SURGERY Right 1989   "plate put in"  . APPENDECTOMY  05-16-2004   open  . CIRCUMCISION N/A 09/09/2013   Procedure: CIRCUMCISION ADULT;  Surgeon: DBernestine Amass MD;  [facility]  Service: Urology;  Laterality: N/A;  . COLECTOMY  05-16-2004  . CORONARY ANGIOPLASTY WITH STENT  PLACEMENT  06/28/2012  DR COOPER   PCI W/  X1 DES to PPosen LAD/  LM  40% OSTIAL & 50-60% DISTAL /  50% PROX LCX/  30-40% PROX RCA & 50% MID RCA/   LVEF 65-70%  . CORONARY ARTERY BYPASS GRAFT N/A 03/31/2014   Procedure: CORONARY ARTERY BYPASS GRAFTING (CABG) times 3 using left internal mammary artery and right saphenous vein.;  Surgeon: SMelrose Nakayama MD;  [place] [facility]  Service: Open Heart Surgery;  Laterality: N/A;  . DOBUTAMINE STRESS ECHO  06-08-2012   MODERATE HYPOKINESIS/ ISCHEMIA MID INFERIOR WALL  .  ESOPHAGOGASTRODUODENOSCOPY  08/15/2012   Procedure: ESOPHAGOGASTRODUODENOSCOPY (EGD);  Surgeon: Wonda Horner, MD;  [facility];  Service: Endoscopy;  Laterality: N/A;  . ESOPHAGOGASTRODUODENOSCOPY N/A 02/17/2014   Procedure: ESOPHAGOGASTRODUODENOSCOPY (EGD);  Surgeon: Jeryl Columbia, MD;  Location[facility];  Service: Endoscopy;  Laterality: N/A;  . ESOPHAGOGASTRODUODENOSCOPY N/A 04/17/2020   Procedure: ESOPHAGOGASTRODUODENOSCOPY (EGD);  Surgeon: Ronnette Juniper, MD;  Location: [place];  Service: Gastroenterology;  Laterality: N/A;  . ESOPHAGOGASTRODUODENOSCOPY (EGD) WITH PROPOFOL N/A 09/30/2017   Procedure: ESOPHAGOGASTRODUODENOSCOPY (EGD) WITH PROPOFOL;  Surgeon: Wonda Horner, MD;  Location[facility] END[facility]COPY;  Service: Endoscopy;  Laterality: N/A;  . ESOPHAGOGASTRODUODENOSCOPY (EGD) WITH PROPOFOL N/A 12/20/2017   Procedure: ESOPHAGOGASTRODUODENOSCOPY (EGD) WITH PROPOFOL;  Surgeon: Clarene Essex, MD;  Location: [place];  Service: Endoscopy;  Laterality: N/A;  . HERNIA REPAIR    . INTRAOPERATIVE TRANSESOPHAGEAL ECHOCARDIOGRAM N/A 03/31/2014   Procedure: INTRAOPERATIVE TRANSESOPHAGEAL ECHOCARDIOGRAM;  Surgeon: Melrose Nakayama, MD;  Location: [place];  Service: Open Heart Surgery;  Laterality: N/A;  . IR RADIOLOGIST EVAL & MGMT  05/06/2020  . LAPAROSCOPIC UMBILICAL HERNIA REPAIR W/ MESH  06-06-2011  . LEFT HEART CATHETERIZATION WITH CORONARY ANGIOGRAM N/A 03/28/2014    Procedure: LEFT HEART CATHETERIZATION WITH CORONARY ANGIOGRAM;  Surgeon: Sinclair Grooms, MD;  [facility] LAB;  Service: Cardiovascular;  Laterality: N/A;  . LIPOMA EXCISION Left 08/25/2017   Procedure: EXCISION LIPOMA LEFT POSTERIOR THIGH;  Surgeon: Clovis Riley, MD;  Location: [place];  Service: General;  Laterality: Left;  . NEPHROLITHOTOMY  1990'S  . OPEN APPENDECTOMY W/ PARTIAL CECECTOMY  05-16-2004  . PERCUTANEOUS CORONARY STENT INTERVENTION (PCI-S) N/A 06/28/2012   Procedure: PERCUTANEOUS CORONARY STENT INTERVENTION (PCI-S);  Surgeon: Sherren Mocha, MD;  Location: [facility] CATH LAB;  Service: Cardiovascular;  Laterality: N/A;     Current Outpatient Medications  Medication Sig Dispense Refill  . Accu-Chek Softclix Lancets lancets USE AS DIRECTED TO TEST BLOOD SUGAR FOUR TIMES DAILY 300 each 3  . albuterol (VENTOLIN HFA) 108 (90 Base) MCG/ACT inhaler Inhale 2 puffs into the lungs every 6 (six) hours as needed for wheezing or shortness of breath. 8 g 3  . atorvastatin (LIPITOR) 40 MG tablet Take 1 tablet (40 mg total) by mouth daily at 6 PM. 30 tablet 3  . blood glucose meter kit and supplies Dispense based on patient and insurance preference. Use up to four times daily as directed. (FOR ICD-10 E10.9, E11.9). 1 each 0  . ferrous sulfate 325 (65 FE) MG EC tablet Take 1 tablet (325 mg total) by mouth daily with breakfast. 90 tablet 3  . gabapentin (NEURONTIN) 100 MG capsule TAKE 1 CAPSULE(100 MG) BY MOUTH TWICE DAILY 180 capsule 3  . nitroGLYCERIN (NITROSTAT) 0.4 MG SL tablet Place 1 tablet (0.4 mg total) under the tongue every 5 (five) minutes as needed for chest pain. 25 tablet 6  . pantoprazole (PROTONIX) 40 MG tablet Take 1 tablet (40 mg total) by mouth daily. 30 tablet 1  . polyethylene glycol (MIRALAX / GLYCOLAX) 17 g packet Take 17 g by mouth daily as needed for moderate constipation. 14 each 0  . spironolactone (ALDACTONE) 25 MG tablet Take 1 tablet (25 mg total) by mouth daily.  30 tablet 2  . torsemide (DEMADEX) 20 MG tablet Take 40 mg (2 pills) every morning and every afternoon except Mon, Wed, Fri afternoons take only 20 mg (1 pill) 360 tablet 3  . umeclidinium-vilanterol (ANORO ELLIPTA) 62.5-25 MCG/INH AEPB Inhale 1 puff into the lungs  daily. 30 each 11   No current facility-administered medications for this visit.    Allergies:   Patient has no known allergies.    Social History:  The patient  reports that he quit smoking about 23 years ago. His smoking use included cigarettes. He has a 80.00 pack-year smoking history. He has never used smokeless tobacco. He reports current alcohol use of about 8.0 standard drinks of alcohol per week. He reports that he does not use drugs.   Family History:  The patient's family history includes Cancer in his brother, father, mother, sister, and sister; Colon cancer in an other family member; Coronary artery disease in an other family member; Diabetes in an other family member; Lung cancer in his sister.    ROS: As noted in current history, all other systems are negative.   Physical Exam: Blood pressure (!) 142/56, pulse 76, height _0  (1.727 m), weight 235 lb (106.6 kg), SpO2 96 %.  GEN:  Well nourished, well developed in no acute distress HEENT: Normal NECK: No JVD; No carotid bruits LYMPHATICS: No lymphadenopathy CARDIAC: RRR , no murmurs, rubs, gallops RESPIRATORY:  Clear to auscultation without rales, wheezing or rhonchi  ABDOMEN: Soft, non-tender, non-distended MUSCULOSKELETAL:  No edema; No deformity  SKIN: Warm and dry NEUROLOGIC:  Alert and oriented x 3   EKG:     Recent Labs: 12/28/2019: TSH 2.761 04/15/2020: B Natriuretic Peptide 293.0 04/23/2020: ALT 16; BUN 19; Creatinine, Ser 1.49; Magnesium 1.8; Potassium 4.0; Sodium 139 05/06/2020: Hemoglobin 8.8; Platelets 179    Lipid Panel    Component Value Date/Time   CHOL 150 11/01/2019 1030   TRIG 69 11/01/2019 1030   HDL 38 (L) 11/01/2019 1030    CHOLHDL 3.9 11/01/2019 1030   VLDL 14 11/01/2019 1030   LDLCALC 98 11/01/2019 1030      Wt Readings from Last 3 Encounters:  05/11/20 235 lb (106.6 kg)  05/06/20 231 lb (104.8 kg)  04/23/20 238 lb 8 oz (108.2 kg)     Other studies Reviewed: Additional studies/ records that were reviewed today include: . Review of the above records demonstrates:    ASSESSMENT AND PLAN:  1.  CAD :   Denies having any episodes of angina.  2. Essential hypertension:   .  Blood pressure seems to be well controlled.  3. Diabetes mellitus:   4. Obesity:   5. Hyperlipidemia:  -    6. Leg edema : He has significant leg edema.  He still admits eating lots of salty foods.  He does not elevate his legs nearly as much as he needs to.  We are repeating his echocardiogram.   7.  GI bleed.  Followed by Dr. Watt Climes.   Hx of hepatic cirrhosis.  He also has some evidence of portal gastropathy and signs of Barrett's esophagus. He was recently admitted to the hospital with a GI bleed and was found to have esophageal varices.  He was noted to be more short of breath.  There was no echocardiogram obtained.  We will be getting an echocardiogram as an outpatient.   Current medicines are reviewed at length with the patient today.  The patient does not have concerns regarding medicines.  The following changes have been made:  no change  Labs/ tests ordered today include:   Orders Placed This Encounter  Procedures  . ECHOCARDIOGRAM COMPLETE    Disposition:             Signed, Mertie Moores, MD  05/11/2020 5:55  PM    [facility] [place], [place], [place]  [address] Phone: [phone]; Fax: [phone]

## 2020-05-11 ENCOUNTER — Encounter: Payer: Self-pay | Admitting: Cardiovascular Disease

## 2020-05-11 ENCOUNTER — Other Ambulatory Visit: Payer: Self-pay

## 2020-05-11 ENCOUNTER — Ambulatory Visit (INDEPENDENT_AMBULATORY_CARE_PROVIDER_SITE_OTHER): Payer: Medicare HMO | Admitting: Cardiovascular Disease

## 2020-05-11 ENCOUNTER — Telehealth: Payer: Self-pay

## 2020-05-11 VITALS — BP 142/56 | HR 76 | Ht 68.0 in | Wt 235.0 lb

## 2020-05-11 DIAGNOSIS — I5032 Chronic diastolic (congestive) heart failure: Secondary | ICD-10-CM

## 2020-05-11 NOTE — Telephone Encounter (Signed)
Called and spoke to the patient and his wife. Made them aware that the patient has an appointment with Richardson Dopp, PA on 10/13. This is after his echo. Per Dr. Cathie Olden the patient can keep follow up with Richardson Dopp, PA and no need for f/u with him in 6 months. Appointment for 11/02/20 cancelled. Patient and his wife verbalized understanding and thanked me for the call.

## 2020-05-11 NOTE — Patient Instructions (Signed)
Medication Instructions:  Your physician recommends that you continue on your current medications as directed. Please refer to the Current Medication list given to you today. *If you need a refill on your cardiac medications before your next appointment, please call your pharmacy*   Lab Work: None  If you have labs (blood work) drawn today and your tests are completely normal, you will receive your results only by: Marland Kitchen MyChart Message (if you have MyChart) OR . A paper copy in the mail If you have any lab test that is abnormal or we need to change your treatment, we will call you to review the results.   Testing/Procedures: Your physician has requested that you have an echocardiogram. Echocardiography is a painless test that uses sound waves to create images of your heart. It provides your doctor with information about the size and shape of your heart and how well your heart's chambers and valves are working. This procedure takes approximately one hour. There are no restrictions for this procedure.  Follow-Up: At [facility], you and your health needs are our priority.  As part of our continuing mission to provide you with exceptional heart care, we have created designated Provider Care Teams.  These Care Teams include your primary Cardiologist (physician) and Advanced Practice Providers (APPs -  Physician Assistants and Nurse Practitioners) who all work together to provide you with the care you need, when you need it.  We recommend signing up for the patient portal called "MyChart".  Sign up information is provided on this After Visit Summary.  MyChart is used to connect with patients for Virtual Visits (Telemedicine).  Patients are able to view lab/test results, encounter notes, upcoming appointments, etc.  Non-urgent messages can be sent to your provider as well.   To learn more about what you can do with MyChart, go to [email or web address].    Your next appointment:   6  month(s)  The format for your next appointment:   In Person  Provider:   You may see Mertie Moores, MD or one of the following Advanced Practice Providers on your designated Care Team:    Richardson Dopp, PA-C  Robbie Lis, [place]    Other Instructions None

## 2020-05-12 ENCOUNTER — Telehealth: Payer: Self-pay

## 2020-05-12 ENCOUNTER — Other Ambulatory Visit: Payer: Self-pay

## 2020-05-12 NOTE — Telephone Encounter (Signed)
Left message for Mr. Cardy's son, [name], that I just called in a prescription for his dad to the [place] in [place].  Valium 5mg  PO (#2, no refills) one hour before scan; may repeat this dose as needed when they get to the facility.  [name] will be the patient's driver.

## 2020-05-12 NOTE — Patient Outreach (Signed)
[facility]) Care Management  05/12/2020  DRESHAWN HENDERSHOTT 1946/07/16 [id number]   Telephone assessment:   2nd outreach attempt to patient which was unsuccessful.  PLAN: Will attempt another outreach in 3-4 business days.   Tomasa Rand, RN, BSN, CEN [facility] [employer] [phone]

## 2020-05-13 ENCOUNTER — Other Ambulatory Visit: Payer: Self-pay | Admitting: Internal Medicine

## 2020-05-13 ENCOUNTER — Ambulatory Visit (HOSPITAL_COMMUNITY)
Admission: RE | Admit: 2020-05-13 | Discharge: 2020-05-13 | Disposition: A | Discharge status: Home / Self Care | Payer: Medicare HMO | Source: Ambulatory Visit | Attending: Interventional Radiology | Admitting: Interventional Radiology

## 2020-05-13 ENCOUNTER — Other Ambulatory Visit: Payer: Self-pay

## 2020-05-13 DIAGNOSIS — K766 Portal hypertension: Secondary | ICD-10-CM | POA: Diagnosis not present

## 2020-05-13 DIAGNOSIS — R160 Hepatomegaly, not elsewhere classified: Secondary | ICD-10-CM | POA: Insufficient documentation

## 2020-05-13 DIAGNOSIS — C220 Liver cell carcinoma: Secondary | ICD-10-CM | POA: Diagnosis not present

## 2020-05-13 DIAGNOSIS — K7689 Other specified diseases of liver: Secondary | ICD-10-CM | POA: Diagnosis not present

## 2020-05-13 DIAGNOSIS — D734 Cyst of spleen: Secondary | ICD-10-CM | POA: Diagnosis not present

## 2020-05-13 MED ORDER — GADOBUTROL 1 MMOL/ML IV SOLN
10.0000 mL | Freq: Once | INTRAVENOUS | Status: AC | PRN
  Administered 2020-05-13: 10 mL via INTRAVENOUS

## 2020-05-15 ENCOUNTER — Other Ambulatory Visit: Payer: Self-pay

## 2020-05-15 NOTE — Patient Outreach (Signed)
[place] [facility]) Care Management  05/15/2020  EDMUND HOLCOMB February 14, 1946 [id number]   Telephone assessment: New referral for GI bleeding.    Placed call to patient who answered and identified himself.  States that he is feeling good.  Reviewed [facility] program. Reviewed with patient how he was doing with medications and he states fine. Reports that he is taking his medications as prescribed.  Reports his son lives with him and provides transportation to appointments.  Patient reports that he has had follow up with MD. Reports MRI this week and is awaiting results. Reports that he has follow up planned for MRI results. Denies any bleeding since hospital discharge.   PLAN: close case as patient denies needs.  Tomasa Rand, RN, BSN, CEN [facility] [employer] [phone]

## 2020-05-20 ENCOUNTER — Encounter: Payer: Self-pay | Admitting: *Deleted

## 2020-05-20 ENCOUNTER — Ambulatory Visit
Admission: RE | Admit: 2020-05-20 | Discharge: 2020-05-20 | Disposition: A | Discharge status: Home / Self Care | Payer: Medicare HMO | Source: Ambulatory Visit | Attending: Interventional Radiology | Admitting: Interventional Radiology

## 2020-05-20 DIAGNOSIS — R160 Hepatomegaly, not elsewhere classified: Secondary | ICD-10-CM

## 2020-05-20 DIAGNOSIS — C220 Liver cell carcinoma: Secondary | ICD-10-CM | POA: Diagnosis not present

## 2020-05-20 DIAGNOSIS — R69 Illness, unspecified: Secondary | ICD-10-CM | POA: Diagnosis not present

## 2020-05-20 HISTORY — PX: IR RADIOLOGIST EVAL & MGMT: IMG5224

## 2020-05-20 NOTE — Progress Notes (Signed)
Chief Complaint: Right liver mass, [place]  Referring Physician(s): Outlaw,William  History of Present Illness: [name] is a 74 y.o. male presenting as a scheduled follow up to [place] clinic today, with right liver mass, for further discussion of treatment options. .   [name] joins [place] today in the clinic with his son, [name].   Hx: We first met [name] as inpatient at [facility].  He was admitted 8/18 via the ED for complaints of lower extremity swelling and SOB.  His work-up uncovered hgb of 5.9, and +occult blood in the stool. Given the anemia, known cirrhosis/risk factors, findings of inpt endoscopy, and history of prior GI bleed, BRTO CT was then performed revealing incidental right liver mass.  MRI + contrast was recommended.   MRI was performed as inpt, but without contrast.  The mass was designated as LR-4 lesion.  AFP was also performed, normal level of 1.3.    Inpt endoscopy was performed revealing no sign of recent hemorrhage, and confirming gastric varices.  No esophageal varices were identified.   Interval Hx:  [name] denies any new symptoms.  He denies any ongoing CP, SOB, DOE.  He denies any hematemesis, BRBPR, or melena.    He says he still has lower extremity edema, but this is relatively stable for him.   He continues to be very active at the house.  His average day is awake early ("I wake up the rooster") and awake all day, staying fairly busy inside and outside the house. He goes to bed bw 8-10pm each night.   ECOG 0    MELD: 11 CP: A, 6 points  Tbili: 0.9 Normal AST, ALT  After our last visit 05/06/2020, he has now completed contrast MRI of the abdomen.  This again demonstrates the right liver mass, segment 7/8, which is characterized as LIRADS-5.  The size is ~4.0cm x 3.2 cm.  This tumor is adjacent to the capsule, and immediately subjacent to the diaphragm.   He does have additional small foci of hyper-enhancement, LIRADS-3, indeterminate. .   Past  Medical History:  Diagnosis Date  . Arthritis    "joints tighten up sometimes" (03/27/2104)  . CHF (congestive heart failure) ([place])   . Chronic lower back pain   . Coronary artery disease    a. 05/2012 Cath/PCI: LM 40ost, 50-60d, LAD 99p ruptured plaque (3.0x28 DES), LCX 50p/m, RCA 30-40p, 81m EF 65-70%.  . Coronary artery disease involving native coronary artery of native heart without angina pectoris    Severe left main disease at catheterization July 2015  CABG x3 with a LIMA to the LAD, SVG to the OM, SVG to the PDA on 03/31/14. EF 60% by cath.   . GERD without esophagitis 08/04/2010  . Hepatic [place]    a. Dx 01/2014 - CT a/p   . History of blood transfusion    "related to bleeding ulcers"  . History of concussion    1976--  NO RESIDUAL  . History of GI bleed    a. UGIB 07/2012;  b. 01/2014 admission with GIB/FOB stool req 1U prbc's->EGD showed portal gastropathy, barrett's esoph, and chronic active h. pylori gastritis.  .Marland KitchenHistory of gout    2007 &  2008  LEFT LEG-- NO ISSUE SINCE  . Hyperlipidemia   . Iron deficiency anemia   . Kidney stones   . OA (osteoarthritis of spine)    LOWER BACK--  INTERMITTANT LEFT LEG NUMBNESS  . OSA (obstructive sleep  apnea)    PULMOLOGIST-  DR CLANCE--  MODERATE OSA  STARTED CPAP 2012--  BUT CURRENTLY HAS NOT USED PAST 6 MONTHS  . Phimosis    a. s/p circumcision 2015.  . Type 2 diabetes mellitus ([place])   . Unspecified essential hypertension     Past Surgical History:  Procedure Laterality Date  . ANKLE FRACTURE SURGERY Right 1989   "plate put in"  . APPENDECTOMY  05-16-2004   open  . CIRCUMCISION N/A 09/09/2013   Procedure: CIRCUMCISION ADULT;  Surgeon: Bernestine Amass, MD;  Location: [facility];  Service: Urology;  Laterality: N/A;  . COLECTOMY  05-16-2004  . CORONARY ANGIOPLASTY WITH STENT PLACEMENT  06/28/2012  DR COOPER   PCI W/  X1 DES to [place]. LAD/  LM  40% OSTIAL & 50-60% DISTAL /  50% PROX LCX/  30-40% PROX RCA &  50% MID RCA/   LVEF 65-70%  . CORONARY ARTERY BYPASS GRAFT N/A 03/31/2014   Procedure: CORONARY ARTERY BYPASS GRAFTING (CABG) times 3 using left internal mammary artery and right saphenous vein.;  Surgeon: Melrose Nakayama, MD;  Location: [place];  Service: Open Heart Surgery;  Laterality: N/A;  . DOBUTAMINE STRESS ECHO  06-08-2012   MODERATE HYPOKINESIS/ ISCHEMIA MID INFERIOR WALL  . ESOPHAGOGASTRODUODENOSCOPY  08/15/2012   Procedure: ESOPHAGOGASTRODUODENOSCOPY (EGD);  Surgeon: Wonda Horner, MD;  Location: [facility] ENDOSCOPY;  Service: Endoscopy;  Laterality: N/A;  . ESOPHAGOGASTRODUODENOSCOPY N/A 02/17/2014   Procedure: ESOPHAGOGASTRODUODENOSCOPY (EGD);  Surgeon: Jeryl Columbia, MD;  Location[facility] ENDOSCOPY;  Service: Endoscopy;  Laterality: N/A;  . ESOPHAGOGASTRODUODENOSCOPY N/A 04/17/2020   Procedure: ESOPHAGOGASTRODUODENOSCOPY (EGD);  Surgeon: Ronnette Juniper, MD;  Location: [place];  Service: Gastroenterology;  Laterality: N/A;  . ESOPHAGOGASTRODUODENOSCOPY (EGD) WITH PROPOFOL N/A 09/30/2017   Procedure: ESOPHAGOGASTRODUODENOSCOPY (EGD) WITH PROPOFOL;  Surgeon: Wonda Horner, MD;  Location: [facility] ENDOSCOPY;  Service: Endoscopy;  Laterality: N/A;  . ESOPHAGOGASTRODUODENOSCOPY (EGD) WITH PROPOFOL N/A 12/20/2017   Procedure: ESOPHAGOGASTRODUODENOSCOPY (EGD) WITH PROPOFOL;  Surgeon: Clarene Essex, MD;  Location: [place];  Service: Endoscopy;  Laterality: N/A;  . HERNIA REPAIR    . INTRAOPERATIVE TRANSESOPHAGEAL ECHOCARDIOGRAM N/A 03/31/2014   Procedure: INTRAOPERATIVE TRANSESOPHAGEAL ECHOCARDIOGRAM;  Surgeon: Melrose Nakayama, MD;  Location: [place];  Service: Open Heart Surgery;  Laterality: N/A;  . IR RADIOLOGIST EVAL & MGMT  05/06/2020  . IR RADIOLOGIST EVAL & MGMT  05/20/2020  . LAPAROSCOPIC UMBILICAL HERNIA REPAIR W/ MESH  06-06-2011  . LEFT HEART CATHETERIZATION WITH CORONARY ANGIOGRAM N/A 03/28/2014   Procedure: LEFT HEART CATHETERIZATION WITH CORONARY ANGIOGRAM;  Surgeon: Sinclair Grooms, MD;   Location: [facility] CATH LAB;  Service: Cardiovascular;  Laterality: N/A;  . LIPOMA EXCISION Left 08/25/2017   Procedure: EXCISION LIPOMA LEFT POSTERIOR THIGH;  Surgeon: Clovis Riley, MD;  Location: [place];  Service: General;  Laterality: Left;  . NEPHROLITHOTOMY  1990'S  . OPEN APPENDECTOMY W/ PARTIAL CECECTOMY  05-16-2004  . PERCUTANEOUS CORONARY STENT INTERVENTION (PCI-S) N/A 06/28/2012   Procedure: PERCUTANEOUS CORONARY STENT INTERVENTION (PCI-S);  Surgeon: Sherren Mocha, MD;  Location: [facility] CATH LAB;  Service: Cardiovascular;  Laterality: N/A;    Allergies: Patient has no known allergies.  Medications: Prior to Admission medications   Medication Sig Start Date End Date Taking? Authorizing Provider  Accu-Chek Softclix Lancets lancets USE AS DIRECTED TO TEST BLOOD SUGAR FOUR TIMES DAILY 03/13/20   [name], MD  albuterol (VENTOLIN HFA) 108 (90 Base) MCG/ACT inhaler Inhale 2 puffs into the lungs  every 6 (six) hours as needed for wheezing or shortness of breath. 03/12/20   [name], MD  atorvastatin (LIPITOR) 40 MG tablet TAKE 1 TABLET BY MOUTH DAILY AT 6 PM 05/13/20   [name], MD  blood glucose meter kit and supplies Dispense based on patient and insurance preference. Use up to four times daily as directed. (FOR ICD-10 E10.9, E11.9). 03/12/20   [name], MD  ferrous sulfate 325 (65 FE) MG EC tablet Take 1 tablet (325 mg total) by mouth daily with breakfast. 01/16/20   [name], MD  gabapentin (NEURONTIN) 100 MG capsule TAKE 1 CAPSULE(100 MG) BY MOUTH TWICE DAILY 05/14/19   [name], MD  nitroGLYCERIN (NITROSTAT) 0.4 MG SL tablet Place 1 tablet (0.4 mg total) under the tongue every 5 (five) minutes as needed for chest pain. 06/08/16   Nahser, Wonda Cheng, MD  pantoprazole (PROTONIX) 40 MG tablet Take 1 tablet (40 mg total) by mouth daily. 04/23/20   Pokhrel, Corrie Mckusick, MD  polyethylene glycol (MIRALAX / GLYCOLAX) 17 g packet Take  17 g by mouth daily as needed for moderate constipation. 04/23/20   Pokhrel, Corrie Mckusick, MD  spironolactone (ALDACTONE) 25 MG tablet Take 1 tablet (25 mg total) by mouth daily. 01/04/20   Nita Sells, MD  torsemide (DEMADEX) 20 MG tablet Take 40 mg (2 pills) every morning and every afternoon except Mon, Wed, Fri afternoons take only 20 mg (1 pill) 02/06/20   Nahser, Wonda Cheng, MD  umeclidinium-vilanterol (ANORO ELLIPTA) 62.5-25 MCG/INH AEPB Inhale 1 puff into the lungs daily. 03/12/20   [name], MD     Family History  Problem Relation Age of Onset  . Lung cancer Sister   . Cancer Sister        lung  . Cancer Mother   . Cancer Father        died in his 24s.  . Cancer Brother        lung  . Coronary artery disease Other   . Diabetes Other   . Colon cancer Other   . Cancer Sister        lung    Social History   Socioeconomic History  . Marital status: Married    Spouse name: Not on file  . Number of children: Not on file  . Years of education: Not on file  . Highest education level: Not on file  Occupational History  . Not on file  Tobacco Use  . Smoking status: Former Smoker    Packs/day: 2.00    Years: 40.00    Pack years: 80.00    Types: Cigarettes    Quit date: 08/29/1996    Years since quitting: 23.7  . Smokeless tobacco: Never Used  Vaping Use  . Vaping Use: Never used  Substance and Sexual Activity  . Alcohol use: Yes    Alcohol/week: 8.0 standard drinks    Types: 8 Cans of beer per week    Comment: 2 beers every other day  . Drug use: No  . Sexual activity: Yes  Other Topics Concern  . Not on file  Social History Narrative   Lives in [place] with wife, son, and son's family.   Social Determinants of Health   Financial Resource Strain:   . Difficulty of Paying Living Expenses: Not on file  Food Insecurity:   . Worried About Charity fundraiser in the Last Year: Not on file  . Ran Out of Food in  the Last Year: Not on file  Transportation Needs:    . Lack of Transportation (Medical): Not on file  . Lack of Transportation (Non-Medical): Not on file  Physical Activity:   . Days of Exercise per Week: Not on file  . Minutes of Exercise per Session: Not on file  Stress:   . Feeling of Stress : Not on file  Social Connections:   . Frequency of Communication with Friends and Family: Not on file  . Frequency of Social Gatherings with Friends and Family: Not on file  . Attends Religious Services: Not on file  . Active Member of Clubs or Organizations: Not on file  . Attends Archivist Meetings: Not on file  . Marital Status: Not on file    ECOG Status: 0 - Asymptomatic  Review of Systems: A 12 point ROS discussed and pertinent positives are indicated in the HPI above.  All other systems are negative.  Review of Systems  Vital Signs: There were no vitals taken for this visit.  Physical Exam General: 74 yo male appearing stated age.  Well-developed, well-nourished.  No distress. HEENT: Atraumatic, normocephalic.  Conjugate gaze, extra-ocular motor intact. No scleral icterus or scleral injection. No lesions on external ears, nose, lips, or gums.  Oral mucosa moist, pink.  Neck: Symmetric with no goiter enlargement.  Chest/Lungs:  Symmetric chest with inspiration/expiration.  No labored breathing.  Clear to auscultation with no wheezes, rhonchi, or rales.  Heart:  RRR, with no third heart sounds appreciated. No JVD appreciated.  Abdomen:  Soft, NT/ND, with + bowel sounds.   Genito-urinary: Deferred Neurologic: Alert & Oriented to person, place, and time.   Normal affect and insight.  Appropriate questions.  Moving all 4 extremities with gross sensory intact.  Extremities: Pitting edema of the bilateral lower extremities.  Mallampati Score:     Imaging: John ABDOMEN WO CONTRAST  Result Date: 04/22/2020 CLINICAL DATA:  Cirrhosis and portal venous hypertension with new liver lesion identified on CT from 04/20/2020. EXAM:  MRI ABDOMEN WITHOUT CONTRAST TECHNIQUE: Multiplanar multisequence John imaging was performed without the administration of intravenous contrast. COMPARISON:  CT AP 04/20/2020. FINDINGS: Exam detail is diminished secondary to motion artifact. Further, evaluation for solid organ pathology may be limited by lack of IV contrast material. Lower chest: Bilateral pleural effusions are identified right greater than left. Hepatobiliary: Morphologic features of cirrhosis identified. Diffuse hepatic steatosis identified. Corresponding to the CT abnormality is a lesion within anterolateral dome of liver (segment 7/8) measuring 3.1 x 3.6 cm, image 10/9. Lesion appears well-circumscribed and slightly exophytic. This shows increased signal on the diffusion-weighted images. On CT from 04/20/2020 there is washout associated with this lesion on the portal venous phase images. Further, this was not seen on previous abdominal sonogram from 12/19/2017 and CT AP from 02/17/2014. No additional liver lesions identified. Mild gallbladder wall edema is nonspecific in the setting of cirrhosis and portal venous hypertension. No bile duct dilatation. Pancreas: No mass, inflammatory changes, or other parenchymal abnormality identified. Spleen: Splenomegaly is again noted with a cranial caudal dimension of 13.8 cm. 1.3 cm T2 hyperintense foci within the inferior spleen is unchanged from remote CT dated 02/17/2014. Within the central spleen there is a smaller T2 hyperintense structure measuring 0.9 cm. Not confidently identified previously additionally, there is a 8 mm T2 hyperintense structure within the superior spleen. Not seen previously. Adrenals/Urinary Tract: Normal appearance of the adrenal glands. Asymmetric atrophy of the right kidney. Stone within the upper pole of  right kidney is again noted measuring 7 mm. Within the left mid kidney there is a 1.5 x 1.0 cm cyst. Incompletely characterized without IV contrast. No hydronephrosis  identified bilaterally. Stomach/Bowel: Visualized portions within the abdomen are unremarkable. Vascular/Lymphatic: Aortic atherosclerosis. No aneurysm. No upper abdominal adenopathy small gastroesophageal and gastric fundus varices. Other:  Trace ascites identified. Musculoskeletal: No acute abnormality. Vertebral hemangiomas identified within the lumbar spine. IMPRESSION: 1. Exam detail is diminished secondary to motion artifact and lack of IV contrast material. 2. Corresponding to the CT abnormality is a lesion within the anterolateral dome of liver segment 7/8 measuring 3.1 x 3.6 cm. Technically incompletely characterized without IV contrast. However, based upon the enhancement characteristics from CT dated 04/20/2020 and review of more remote imaging including abdominal sonogram from 12/19/2017 and CT AP from 02/17/2014 findings are compatible with a LR-4 lesion, probably hepatocellular carcinoma. When the patient is clinically stable and able to follow directions and hold their breath (preferably as an outpatient) further evaluation with dedicated liver protocol abdominal MRI without and with contrast should be considered. 3. Morphologic features of cirrhosis with stigmata of portal venous hypertension including splenomegaly, trace ascites, and small gastroesophageal and gastric fundus varices. 4. Small T2 hyperintense lesions within the spleen are unchanged from remote CT dated 02/17/2014. These are favored to be benign in etiology. 5. Bilateral pleural effusions are identified right greater than left. Electronically Signed   By: Kerby Moors M.D.   On: 04/22/2020 06:01   John ABDOMEN WWO CONTRAST  Result Date: 05/14/2020 CLINICAL DATA:  Follow-up liver lesion. History of cirrhosis and portal venous hypertension. EXAM: MRI ABDOMEN WITHOUT AND WITH CONTRAST TECHNIQUE: Multiplanar multisequence John imaging of the abdomen was performed both before and after the administration of intravenous contrast.  CONTRAST:  89m GADAVIST GADOBUTROL 1 MMOL/ML IV SOLN COMPARISON:  04/21/2020 FINDINGS: Lower chest: No acute findings. Hepatobiliary: The contour of the liver is diffusely nodular and there is relative hypertrophy of the caudate lobe and lateral segment of left lobe. Within segment 7/8 of the liver there is a arterial phase enhancing structure which measures 4.0 x 3.2 cm, image 32 series 17. Associated washout on the delayed images noted with pseudo capsule appearance noted on the 45 second delayed sequences. Imaging findings are compatible with LI-RADS CATEGORY 5, definitely hepatocellular carcinoma. There is heterogeneous arterial phase enhancement within the remaining portions of the liver with scattered subcentimeter foci of arterial phase enhancement without signs of washout or pseudo capsule on the delayed images. This includes a 0.9 cm structure in the posterior right hepatic lobe, image 45/15. The gallbladder appears normal. No biliary ductal dilatation. Pancreas: No mass, inflammatory changes, or other parenchymal abnormality identified. Spleen: The spleen is enlarged measuring 13.8 x 7.8 x 13.6 cm (volume = 770 cm^3). Within the posterior spleen there is a 1.4 cm enhancing lesion which is favored to represent a hemangioma. T2 hyperintense and T1 hypointense structure within the anterior spleen measures 1.1 cm, image 40/2. This is favored to represent a benign acquired or congenital cyst. Second T2 hyperintense structure without enhancement is noted within the posteroinferior spleen measuring 1.3 cm, image 27/3. This is also favored to represent an acquired or congenital cyst. Adrenals/Urinary Tract: Normal appearance of the adrenal glands. Asymmetric right renal atrophy. Simple appearing cyst within the mid left kidney measures 1.4 cm. No hydronephrosis identified bilaterally. Stomach/Bowel: Visualized portions within the abdomen are unremarkable. Vascular/Lymphatic: No pathologically enlarged lymph nodes  identified. Esophageal and gastric varices identified. No abdominal aortic  aneurysm demonstrated. Other:  No ascites identified. Musculoskeletal: No suspicious bone lesions identified. IMPRESSION: 1. Morphologic features of the liver compatible with cirrhosis. Stigmata of portal venous hypertension noted including splenomegaly and varices. 2. There is a 4.0 x 3.2 cm arterial phase enhancing structure with washout on the delayed images and pseudo capsule appearance noted on the 45 second delayed sequences. Imaging findings are compatible with LI-RADS CATEGORY 5, definitely hepatocellular carcinoma. 3. Several small foci of arterial phase enhancement without signs of washout or pseudo capsule on the delayed images. These are compatible with LI-RADS CATEGORY 3, intermediate probability of malignancy. Electronically Signed   By: Kerby Moors M.D.   On: 05/14/2020 10:24   IR Radiologist Eval & Mgmt  Result Date: 05/20/2020 Please refer to notes tab for details about interventional procedure. (Op Note)  IR Radiologist Eval & Mgmt  Result Date: 05/06/2020 Please refer to notes tab for details about interventional procedure. (Op Note)   Labs:  CBC: Recent Labs    04/21/20 0840 04/22/20 0953 04/23/20 0417 05/06/20 0954  WBC 4.2 4.9 3.8* 3.7*  HGB 8.0* 8.8* 8.0* 8.8*  HCT 28.4* 31.0* 28.1* 28.9*  PLT 140* 152 129* 179    COAGS: Recent Labs    11/01/19 0603 01/03/20 0518 04/15/20 2102  INR 1.2 1.2 1.1    BMP: Recent Labs    04/20/20 0617 04/21/20 0840 04/22/20 0953 04/23/20 0417  NA 137 136 137 139  K 4.3 4.4 4.4 4.0  CL 101 104 103 103  CO2 _0 GLUCOSE 80 135* 190* 105*  BUN _1 CALCIUM 9.4 9.1 9.1 8.7*  CREATININE 1.18 1.20 1.27* 1.49*  GFRNONAA >60 59* 55* 46*  GFRAA >60 >60 >60 53*    LIVER FUNCTION TESTS: Recent Labs    04/17/20 0439 04/18/20 0213 04/21/20 0840 04/23/20 0417  BILITOT 1.7* 1.5* 1.2 0.9  AST _2 ALT _3 ALKPHOS 90 94 115 99  PROT 6.1* 6.1* 6.7 6.2*  ALBUMIN 3.2* 3.2* 3.4* 3.2*    TUMOR MARKERS: No results for input(s): AFPTM, CEA, CA199, CHROMGRNA in the last 8760 hours.  Assessment and Plan:  [name] is a 75 yo male with ETOH related cirrhosis, and incidentally discovered right hepatic mass.    Recent MRI with contrast confirms LIRADS-5 tumor.  This confers a confidence level of greater than 98% chance of HCC, and in this scenario a biopsy may be deferred, particularly because of the difficult location of the lesion.   His ECOG is 0, and with this LIRADS-5 and several additional LIRADS-3 lesions, his BCLC is intermediate stage.   I have had personal communication with our hepatobiliary surgeons, and they feel he is not a good surgical candidate given the stigmata of portal HTN.  I did share this with [name] and his son.   We had further discussion regarding the options for interventional radiology.  While this might in some circumstances be a good candidate for ablation, I think this is a very challenging location for a good result, and with high risk of not only residual/recurrence, but with high risk to the diaphragm/lung.    Instead, I think he is a very good candidate for y90 segmentectomy, which we focused our discussion.  I informed them of the logistics of the angiogram and treatment, including the need for 2 planned sessions for mapping and then therapy.  Risks discussed included: bleeding, infection, artery  injury, kidney injury, contrast reaction, need for further surgery/procedure, hospitalization, embolization, bowel ulceration, non-target embolization, cardiopulmonary collapse, death.   After our discussion, they would like to proceed with treatment.    Plan: - Tentatively schedule for y90 mapping and treatment for right liver lesion, with Dr. Earleen Newport, at [facility].  - I have encouraged him to follow up with his other scheduled appointments, including the GI team.    Electronically Signed: Corrie Mckusick 05/20/2020, 4:44 PM   I spent a total of    40 Minutes in face to face in clinical consultation, greater than 50% of which was counseling/coordinating care for right liver HCC, possible angiogram and y90 treatment

## 2020-05-25 ENCOUNTER — Other Ambulatory Visit: Payer: Self-pay

## 2020-05-25 ENCOUNTER — Encounter: Payer: Self-pay | Admitting: Podiatry

## 2020-05-25 ENCOUNTER — Ambulatory Visit (INDEPENDENT_AMBULATORY_CARE_PROVIDER_SITE_OTHER): Payer: Medicare HMO | Admitting: Podiatry

## 2020-05-25 DIAGNOSIS — M2141 Flat foot [pes planus] (acquired), right foot: Secondary | ICD-10-CM

## 2020-05-25 DIAGNOSIS — M79674 Pain in right toe(s): Secondary | ICD-10-CM | POA: Diagnosis not present

## 2020-05-25 DIAGNOSIS — B351 Tinea unguium: Secondary | ICD-10-CM | POA: Diagnosis not present

## 2020-05-25 DIAGNOSIS — M79675 Pain in left toe(s): Secondary | ICD-10-CM

## 2020-05-25 DIAGNOSIS — L84 Corns and callosities: Secondary | ICD-10-CM

## 2020-05-25 DIAGNOSIS — E1142 Type 2 diabetes mellitus with diabetic polyneuropathy: Secondary | ICD-10-CM | POA: Diagnosis not present

## 2020-05-25 DIAGNOSIS — M2142 Flat foot [pes planus] (acquired), left foot: Secondary | ICD-10-CM

## 2020-05-27 ENCOUNTER — Ambulatory Visit (HOSPITAL_COMMUNITY): Payer: Medicare HMO | Attending: Internal Medicine

## 2020-05-27 ENCOUNTER — Other Ambulatory Visit: Payer: Self-pay

## 2020-05-27 DIAGNOSIS — I5032 Chronic diastolic (congestive) heart failure: Secondary | ICD-10-CM

## 2020-05-28 LAB — ECHOCARDIOGRAM COMPLETE
AR max vel: 2.38 cm2
AV Area VTI: 2.58 cm2
AV Area mean vel: 2.46 cm2
AV Mean grad: 10 mmHg
AV Peak grad: 21.1 mmHg
Ao pk vel: 2.3 m/s
Area-P 1/2: 2.46 cm2
P 1/2 time: 293 msec
S' Lateral: 3.8 cm

## 2020-05-28 NOTE — Progress Notes (Signed)
Subjective:  Patient ID: [name], male    DOB: 06/20/1946,  MRN: [id number]  74 y.o. male presents with at risk foot care with history of diabetic neuropathy and painful thick toenails that are difficult to trim. Pain interferes with ambulation. Aggravating factors include wearing enclosed shoe gear. Pain is relieved with periodic professional debridement.Marland Kitchen    PCP: [name], MD and last visit was: 03/12/2020.  He states he has appt with oncologist regarding his liver cancer.   Review of Systems: Negative except as noted in the HPI.  Past Medical History:  Diagnosis Date   Arthritis    "joints tighten up sometimes" (03/27/2104)   CHF (congestive heart failure) (HCC)    Chronic lower back pain    Coronary artery disease    a. 05/2012 Cath/PCI: LM 40ost, 50-60d, LAD 99p ruptured plaque (3.0x28 DES), LCX 50p/m, RCA 30-40p, 58m EF 65-70%.   Coronary artery disease involving native coronary artery of native heart without angina pectoris    Severe left main disease at catheterization July 2015  CABG x3 with a LIMA to the LAD, SVG to the OM, SVG to the PDA on 03/31/14. EF 60% by cath.    GERD without esophagitis 08/04/2010   Hepatic cirrhosis ([place]    a. Dx 01/2014 - CT a/p    History of blood transfusion    "related to bleeding ulcers"   History of concussion    1976--  NO RESIDUAL   History of GI bleed    a. UGIB 07/2012;  b. 01/2014 admission with GIB/FOB stool req 1U prbc's->EGD showed portal gastropathy, barrett's esoph, and chronic active h. pylori gastritis.   History of gout    2007 &  2008  LEFT LEG-- NO ISSUE SINCE   Hyperlipidemia    Iron deficiency anemia    Kidney stones    OA (osteoarthritis of spine)    LOWER BACK--  INTERMITTANT LEFT LEG NUMBNESS   OSA (obstructive sleep apnea)    PULMOLOGIST-  DR CLANCE--  MODERATE OSA  STARTED CPAP 2012--  BUT CURRENTLY HAS NOT USED PAST 6 MONTHS   Phimosis    a. s/p circumcision 2015.   Type 2  diabetes mellitus ([place]    Unspecified essential hypertension    Past Surgical History:  Procedure Laterality Date   ANKLE FRACTURE SURGERY Right 1989   "plate put in"   APPENDECTOMY  05-16-2004   open   CIRCUMCISION N/A 09/09/2013   Procedure: CIRCUMCISION ADULT;  Surgeon: DBernestine Amass MD;  [facility]  Service: Urology;  Laterality: N/A;   COLECTOMY  05-16-2004   CORONARY ANGIOPLASTY WITH STENT PLACEMENT  06/28/2012  DR COOPER   PCI W/  X1 DES to PJack LAD/  LM  40% OSTIAL & 50-60% DISTAL /  50% PROX LCX/  30-40% PROX RCA & 50% MID RCA/   LVEF 65-70%   CORONARY ARTERY BYPASS GRAFT N/A 03/31/2014   Procedure: CORONARY ARTERY BYPASS GRAFTING (CABG) times 3 using left internal mammary artery and right saphenous vein.;  Surgeon: SMelrose Nakayama MD;  [place]  Service: Open Heart Surgery;  Laterality: N/A;   DOBUTAMINE STRESS ECHO  06-08-2012   MODERATE HYPOKINESIS/ ISCHEMIA MID INFERIOR WALL   ESOPHAGOGASTRODUODENOSCOPY  08/15/2012   Procedure: ESOPHAGOGASTRODUODENOSCOPY (EGD);  Surgeon: SWonda Horner MD;  [facility]  Service: Endoscopy;  Laterality: N/A;   ESOPHAGOGASTRODUODENOSCOPY N/A 02/17/2014   Procedure: ESOPHAGOGASTRODUODENOSCOPY (EGD);  Surgeon: MJeryl Columbia MD;  Location:  [place] [facility];  Service: Endoscopy;  Laterality: N/A;   ESOPHAGOGASTRODUODENOSCOPY N/A 04/17/2020   Procedure: ESOPHAGOGASTRODUODENOSCOPY (EGD);  Surgeon: Ronnette Juniper, MD;  Location: [place];  Service: Gastroenterology;  Laterality: N/A;   ESOPHAGOGASTRODUODENOSCOPY (EGD) WITH PROPOFOL N/A 09/30/2017   Procedure: ESOPHAGOGASTRODUODENOSCOPY (EGD) WITH PROPOFOL;  Surgeon: Wonda Horner, MD;  [facility];  Service: Endoscopy;  Laterality: N/A;   ESOPHAGOGASTRODUODENOSCOPY (EGD) WITH PROPOFOL N/A 12/20/2017   Procedure: ESOPHAGOGASTRODUODENOSCOPY (EGD) WITH PROPOFOL;  Surgeon: Clarene Essex, MD;  Location: [place];  Service: Endoscopy;   Laterality: N/A;   HERNIA REPAIR     INTRAOPERATIVE TRANSESOPHAGEAL ECHOCARDIOGRAM N/A 03/31/2014   Procedure: INTRAOPERATIVE TRANSESOPHAGEAL ECHOCARDIOGRAM;  Surgeon: Melrose Nakayama, MD;  Location: [place];  Service: Open Heart Surgery;  Laterality: N/A;   IR RADIOLOGIST EVAL & MGMT  05/06/2020   IR RADIOLOGIST EVAL & MGMT  05/20/2020   LAPAROSCOPIC UMBILICAL HERNIA REPAIR W/ MESH  06-06-2011   LEFT HEART CATHETERIZATION WITH CORONARY ANGIOGRAM N/A 03/28/2014   Procedure: LEFT HEART CATHETERIZATION WITH CORONARY ANGIOGRAM;  Surgeon: Sinclair Grooms, MD;  Location[facility] CATH LAB;  Service: Cardiovascular;  Laterality: N/A;   LIPOMA EXCISION Left 08/25/2017   Procedure: EXCISION LIPOMA LEFT POSTERIOR THIGH;  Surgeon: Clovis Riley, MD;  Location: [place];  Service: General;  Laterality: Left;   NEPHROLITHOTOMY  1990'S   OPEN APPENDECTOMY W/ PARTIAL CECECTOMY  05-16-2004   PERCUTANEOUS CORONARY STENT INTERVENTION (PCI-S) N/A 06/28/2012   Procedure: PERCUTANEOUS CORONARY STENT INTERVENTION (PCI-S);  Surgeon: Sherren Mocha, MD;  Location[facility] LAB;  Service: Cardiovascular;  Laterality: N/A;   Patient Active Problem List   Diagnosis Date Noted   Routine general medical examination at a health care facility 05/06/2020   Iron deficiency anemia    Symptomatic anemia 04/15/2020   Pulmonary hypertension, unspecified ([place]) 03/11/2020   Asthma, mild intermittent 36/14/4315   Alcoholic cirrhosis of liver with ascites ([place]) 12/27/2019   (HFpEF) heart failure with preserved ejection fraction ([place]) 12/24/2018   CKD (chronic kidney disease), stage III 12/18/2017   Peptic ulcer disease 12/18/2017   GI bleed 09/29/2017   Morbid obesity ([place]) 02/05/2016   S/P CABG x 3 03/31/2014   Hepatic cirrhosis ([place]) 02/27/2014   Hyperlipidemia with target LDL less than 70 06/29/2012   Thrombocytopenia ([place]) 06/29/2012   Coronary artery disease involving native coronary artery of  native heart without angina pectoris    OSA (obstructive sleep apnea)    Type 2 diabetes with complication ([place]) 40/03/6760   GERD without esophagitis 08/04/2010   History of gout    Hypertensive heart disease     Current Outpatient Medications:    Accu-Chek Softclix Lancets lancets, USE AS DIRECTED TO TEST BLOOD SUGAR FOUR TIMES DAILY, Disp: 300 each, Rfl: 3   albuterol (VENTOLIN HFA) 108 (90 Base) MCG/ACT inhaler, Inhale 2 puffs into the lungs every 6 (six) hours as needed for wheezing or shortness of breath., Disp: 8 g, Rfl: 3   atorvastatin (LIPITOR) 40 MG tablet, TAKE 1 TABLET BY MOUTH DAILY AT 6 PM, Disp: 90 tablet, Rfl: 0   blood glucose meter kit and supplies, Dispense based on patient and insurance preference. Use up to four times daily as directed. (FOR ICD-10 E10.9, E11.9)., Disp: 1 each, Rfl: 0   diazepam (VALIUM) 5 MG tablet, Take 5 mg by mouth 2 (two) times daily as needed., Disp: , Rfl:    ferrous sulfate 325 (65 FE) MG EC tablet, Take 1 tablet (325 mg total) by  mouth daily with breakfast., Disp: 90 tablet, Rfl: 3   gabapentin (NEURONTIN) 100 MG capsule, TAKE 1 CAPSULE(100 MG) BY MOUTH TWICE DAILY, Disp: 180 capsule, Rfl: 3   nitroGLYCERIN (NITROSTAT) 0.4 MG SL tablet, Place 1 tablet (0.4 mg total) under the tongue every 5 (five) minutes as needed for chest pain., Disp: 25 tablet, Rfl: 6   pantoprazole (PROTONIX) 40 MG tablet, Take 1 tablet (40 mg total) by mouth daily., Disp: 30 tablet, Rfl: 1   polyethylene glycol (MIRALAX / GLYCOLAX) 17 g packet, Take 17 g by mouth daily as needed for moderate constipation., Disp: 14 each, Rfl: 0   spironolactone (ALDACTONE) 25 MG tablet, Take 1 tablet (25 mg total) by mouth daily., Disp: 30 tablet, Rfl: 2   torsemide (DEMADEX) 20 MG tablet, Take 40 mg (2 pills) every morning and every afternoon except Mon, Wed, Fri afternoons take only 20 mg (1 pill), Disp: 360 tablet, Rfl: 3   umeclidinium-vilanterol (ANORO ELLIPTA) 62.5-25  MCG/INH AEPB, Inhale 1 puff into the lungs daily., Disp: 30 each, Rfl: 11 No Known Allergies Social History   Tobacco Use  Smoking Status Former Smoker   Packs/day: 2.00   Years: 40.00   Pack years: 80.00   Types: Cigarettes   Quit date: 08/29/1996   Years since quitting: 23.7  Smokeless Tobacco Never Used   Objective:  There were no vitals filed for this visit. Constitutional Patient is a pleasant 74 y.o. Caucasian male morbidly obese in NAD.Marland Kitchen AAO x 3.  Vascular Capillary refill time to digits immediate b/l. Palpable DP pulse(s) b/l lower extremities Nonpalpable PT pulse(s) b/l lower extremities. Pedal hair sparse. Lower extremity skin temperature gradient within normal limits. Lymphedema present b/l lower extremities. No cyanosis or clubbing noted.  Neurologic Normal speech. Oriented to person, place, and time. Protective sensation diminished with 10g monofilament b/l. Vibratory sensation decreased b/l.  Dermatologic Pedal skin with normal turgor, texture and tone bilaterally. No open wounds bilaterally. No interdigital macerations bilaterally. Toenails 1-5 b/l elongated, discolored, dystrophic, thickened, crumbly with subungual debris and tenderness to dorsal palpation. Hyperkeratotic lesion(s) submet head 5 left foot and submet head 5 right foot.  No erythema, no edema, no drainage, no flocculence.  Orthopedic: Normal muscle strength 5/5 to all lower extremity muscle groups bilaterally. No pain crepitus or joint limitation noted with ROM b/l. Pes planus deformity noted b/l.    Hemoglobin A1C Latest Ref Rng & Units 05/06/2020 03/12/2020 11/01/2019 07/29/2019  HGBA1C <5.7 % of total Hgb 5.0 5.2 5.0 6.1(H)  Some recent data might be hidden   Assessment:   1. Pain due to onychomycosis of toenails of both feet   2. Callus   3. Pes planus of both feet   4. Diabetic peripheral neuropathy associated with type 2 diabetes mellitus ([place])    Plan:  Patient was evaluated and treated and  all questions answered.  Onychomycosis with pain -Nails palliatively debridement as below. -Educated on self-care  Procedure: Nail Debridement Rationale: Pain Type of Debridement: manual, Favorite debridement. Instrumentation: Nail nipper, rotary burr. Number of Nails: 10  -Examined patient. -No new findings. No new orders. -Continue diabetic foot care principles. -Toenails 1-5 b/l were debrided in length and girth with sterile nail nippers and dremel without iatrogenic bleeding.  -Callus(es) submet head 5 left foot and submet head 5 right foot pared utilizing sterile scalpel blade without complication or incident. Total number debrided =2. -Patient to report any pedal injuries to medical professional immediately. -Patient to continue soft, supportive shoe gear  daily. -Patient/POA to call should there be question/concern in the interim.  Return in about 3 months (around 08/24/2020).  Marzetta Board, DPM

## 2020-05-29 ENCOUNTER — Other Ambulatory Visit (HOSPITAL_COMMUNITY): Payer: Self-pay | Admitting: Interventional Radiology

## 2020-05-29 DIAGNOSIS — C220 Liver cell carcinoma: Secondary | ICD-10-CM

## 2020-05-31 ENCOUNTER — Other Ambulatory Visit: Payer: Self-pay | Admitting: Internal Medicine

## 2020-06-01 ENCOUNTER — Encounter (HOSPITAL_COMMUNITY): Payer: Self-pay

## 2020-06-01 ENCOUNTER — Emergency Department (HOSPITAL_COMMUNITY)
Admission: EM | Admit: 2020-06-01 | Discharge: 2020-06-01 | Disposition: A | Discharge status: Home / Self Care | Payer: Medicare HMO | Attending: Emergency Medicine | Admitting: Emergency Medicine

## 2020-06-01 ENCOUNTER — Other Ambulatory Visit: Payer: Self-pay

## 2020-06-01 DIAGNOSIS — R600 Localized edema: Secondary | ICD-10-CM | POA: Diagnosis not present

## 2020-06-01 DIAGNOSIS — I2510 Atherosclerotic heart disease of native coronary artery without angina pectoris: Secondary | ICD-10-CM | POA: Insufficient documentation

## 2020-06-01 DIAGNOSIS — R609 Edema, unspecified: Secondary | ICD-10-CM | POA: Diagnosis not present

## 2020-06-01 DIAGNOSIS — Z951 Presence of aortocoronary bypass graft: Secondary | ICD-10-CM | POA: Insufficient documentation

## 2020-06-01 DIAGNOSIS — I110 Hypertensive heart disease with heart failure: Secondary | ICD-10-CM | POA: Insufficient documentation

## 2020-06-01 DIAGNOSIS — I509 Heart failure, unspecified: Secondary | ICD-10-CM | POA: Diagnosis not present

## 2020-06-01 DIAGNOSIS — R2240 Localized swelling, mass and lump, unspecified lower limb: Secondary | ICD-10-CM | POA: Diagnosis not present

## 2020-06-01 DIAGNOSIS — E119 Type 2 diabetes mellitus without complications: Secondary | ICD-10-CM | POA: Insufficient documentation

## 2020-06-01 DIAGNOSIS — Z87891 Personal history of nicotine dependence: Secondary | ICD-10-CM | POA: Diagnosis not present

## 2020-06-01 LAB — CBC WITH DIFFERENTIAL/PLATELET
Abs Immature Granulocytes: 0.01 10*3/uL (ref 0.00–0.07)
Basophils Absolute: 0 10*3/uL (ref 0.0–0.1)
Basophils Relative: 0 %
Eosinophils Absolute: 0.1 10*3/uL (ref 0.0–0.5)
Eosinophils Relative: 2 %
HCT: 26.8 % — ABNORMAL LOW (ref 39.0–52.0)
Hemoglobin: 7.6 g/dL — ABNORMAL LOW (ref 13.0–17.0)
Immature Granulocytes: 0 %
Lymphocytes Relative: 10 %
Lymphs Abs: 0.5 10*3/uL — ABNORMAL LOW (ref 0.7–4.0)
MCH: 24.5 pg — ABNORMAL LOW (ref 26.0–34.0)
MCHC: 28.4 g/dL — ABNORMAL LOW (ref 30.0–36.0)
MCV: 86.5 fL (ref 80.0–100.0)
Monocytes Absolute: 0.4 10*3/uL (ref 0.1–1.0)
Monocytes Relative: 8 %
Neutro Abs: 4 10*3/uL (ref 1.7–7.7)
Neutrophils Relative %: 80 %
Platelets: 137 10*3/uL — ABNORMAL LOW (ref 150–400)
RBC: 3.1 MIL/uL — ABNORMAL LOW (ref 4.22–5.81)
RDW: 21 % — ABNORMAL HIGH (ref 11.5–15.5)
WBC: 5 10*3/uL (ref 4.0–10.5)
nRBC: 0 % (ref 0.0–0.2)

## 2020-06-01 LAB — COMPREHENSIVE METABOLIC PANEL
ALT: 18 U/L (ref 0–44)
AST: 26 U/L (ref 15–41)
Albumin: 3.6 g/dL (ref 3.5–5.0)
Alkaline Phosphatase: 112 U/L (ref 38–126)
Anion gap: 11 (ref 5–15)
BUN: 24 mg/dL — ABNORMAL HIGH (ref 8–23)
CO2: 28 mmol/L (ref 22–32)
Calcium: 8.5 mg/dL — ABNORMAL LOW (ref 8.9–10.3)
Chloride: 102 mmol/L (ref 98–111)
Creatinine, Ser: 1.06 mg/dL (ref 0.61–1.24)
GFR calc Af Amer: >60 mL/min (ref >60)
GFR calc non Af Amer: >60 mL/min (ref >60)
Glucose, Bld: 149 mg/dL — ABNORMAL HIGH (ref 70–99)
Potassium: 3.7 mmol/L (ref 3.5–5.1)
Sodium: 141 mmol/L (ref 135–145)
Total Bilirubin: 1 mg/dL (ref 0.3–1.2)
Total Protein: 6.9 g/dL (ref 6.5–8.1)

## 2020-06-01 NOTE — ED Triage Notes (Signed)
Pt sent here by PCP d/t increase BLE swelling. Pt reports taking his "fluid pill" with no relief

## 2020-06-01 NOTE — ED Provider Notes (Signed)
[facility] Emergency Department Provider Note MRN:  [id number]  Arrival date & time: 06/01/20     Chief Complaint   Leg Swelling   History of Present Illness   [name] is a 74 y.o. year-old male with a history of CAD, CHF, cirrhosis presenting to the ED with chief complaint of leg swelling.  Worsening leg swelling over the past several days.  Fluid pill does not seem to be helping.  Denies any chest pain or shortness of breath, no abdominal pain, no fever, no cough, no burning with urination.  Denies any black stool or blood in the stool.  Symptoms constant, moderate discomfort in the legs due to the swelling.  Review of Systems  A complete 10 system review of systems was obtained and all systems are negative except as noted in the HPI and PMH.   Patient's Health History    Past Medical History:  Diagnosis Date  . Arthritis    "joints tighten up sometimes" (03/27/2104)  . CHF (congestive heart failure) ([place])   . Chronic lower back pain   . Coronary artery disease    a. 05/2012 Cath/PCI: LM 40ost, 50-60d, LAD 99p ruptured plaque (3.0x28 DES), LCX 50p/m, RCA 30-40p, 42m, EF 65-70%.  . Coronary artery disease involving native coronary artery of native heart without angina pectoris    Severe left main disease at catheterization July 2015  CABG x3 with a LIMA to the LAD, SVG to the OM, SVG to the PDA on 03/31/14. EF 60% by cath.   . GERD without esophagitis 08/04/2010  . Hepatic cirrhosis ([place])    a. Dx 01/2014 - CT a/p   . History of blood transfusion    "related to bleeding ulcers"  . History of concussion    1976--  NO RESIDUAL  . History of GI bleed    a. UGIB 07/2012;  b. 01/2014 admission with GIB/FOB stool req 1U prbc's->EGD showed portal gastropathy, barrett's esoph, and chronic active h. pylori gastritis.  Marland Kitchen History of gout    2007 &  2008  LEFT LEG-- NO ISSUE SINCE  . Hyperlipidemia   . Iron deficiency anemia   . Kidney stones   . OA  (osteoarthritis of spine)    LOWER BACK--  INTERMITTANT LEFT LEG NUMBNESS  . OSA (obstructive sleep apnea)    PULMOLOGIST-  DR CLANCE--  MODERATE OSA  STARTED CPAP 2012--  BUT CURRENTLY HAS NOT USED PAST 6 MONTHS  . Phimosis    a. s/p circumcision 2015.  . Type 2 diabetes mellitus ([place])   . Unspecified essential hypertension     Past Surgical History:  Procedure Laterality Date  . ANKLE FRACTURE SURGERY Right 1989   "plate put in"  . APPENDECTOMY  05-16-2004   open  . CIRCUMCISION N/A 09/09/2013   Procedure: CIRCUMCISION ADULT;  Surgeon: Bernestine Amass, MD;  [facility];  Service: Urology;  Laterality: N/A;  . COLECTOMY  05-16-2004  . CORONARY ANGIOPLASTY WITH STENT PLACEMENT  06/28/2012  DR COOPER   PCI W/  X1 DES to [place]. LAD/  LM  40% OSTIAL & 50-60% DISTAL /  50% PROX LCX/  30-40% PROX RCA & 50% MID RCA/   LVEF 65-70%  . CORONARY ARTERY BYPASS GRAFT N/A 03/31/2014   Procedure: CORONARY ARTERY BYPASS GRAFTING (CABG) times 3 using left internal mammary artery and right saphenous vein.;  Surgeon: Melrose Nakayama, MD;  Location: [place];  Service: Open Heart Surgery;  Laterality: N/A;  . DOBUTAMINE STRESS ECHO  06-08-2012   MODERATE HYPOKINESIS/ ISCHEMIA MID INFERIOR WALL  . ESOPHAGOGASTRODUODENOSCOPY  08/15/2012   Procedure: ESOPHAGOGASTRODUODENOSCOPY (EGD);  Surgeon: Wonda Horner, MD;  Location: [facility] ENDOSCOPY;  Service: Endoscopy;  Laterality: N/A;  . ESOPHAGOGASTRODUODENOSCOPY N/A 02/17/2014   Procedure: ESOPHAGOGASTRODUODENOSCOPY (EGD);  Surgeon: Jeryl Columbia, MD;  Location: [facility] ENDOSCOPY;  Service: Endoscopy;  Laterality: N/A;  . ESOPHAGOGASTRODUODENOSCOPY N/A 04/17/2020   Procedure: ESOPHAGOGASTRODUODENOSCOPY (EGD);  Surgeon: Ronnette Juniper, MD;  Location: [place];  Service: Gastroenterology;  Laterality: N/A;  . ESOPHAGOGASTRODUODENOSCOPY (EGD) WITH PROPOFOL N/A 09/30/2017   Procedure: ESOPHAGOGASTRODUODENOSCOPY (EGD) WITH PROPOFOL;  Surgeon: Wonda Horner, MD;  Location: [facility] ENDOSCOPY;  Service: Endoscopy;  Laterality: N/A;  . ESOPHAGOGASTRODUODENOSCOPY (EGD) WITH PROPOFOL N/A 12/20/2017   Procedure: ESOPHAGOGASTRODUODENOSCOPY (EGD) WITH PROPOFOL;  Surgeon: Clarene Essex, MD;  Location: [place];  Service: Endoscopy;  Laterality: N/A;  . HERNIA REPAIR    . INTRAOPERATIVE TRANSESOPHAGEAL ECHOCARDIOGRAM N/A 03/31/2014   Procedure: INTRAOPERATIVE TRANSESOPHAGEAL ECHOCARDIOGRAM;  Surgeon: Melrose Nakayama, MD;  Location: [place];  Service: Open Heart Surgery;  Laterality: N/A;  . IR RADIOLOGIST EVAL & MGMT  05/06/2020  . IR RADIOLOGIST EVAL & MGMT  05/20/2020  . LAPAROSCOPIC UMBILICAL HERNIA REPAIR W/ MESH  06-06-2011  . LEFT HEART CATHETERIZATION WITH CORONARY ANGIOGRAM N/A 03/28/2014   Procedure: LEFT HEART CATHETERIZATION WITH CORONARY ANGIOGRAM;  Surgeon: Sinclair Grooms, MD;  Location: [facility] CATH LAB;  Service: Cardiovascular;  Laterality: N/A;  . LIPOMA EXCISION Left 08/25/2017   Procedure: EXCISION LIPOMA LEFT POSTERIOR THIGH;  Surgeon: Clovis Riley, MD;  Location: [place];  Service: General;  Laterality: Left;  . NEPHROLITHOTOMY  1990'S  . OPEN APPENDECTOMY W/ PARTIAL CECECTOMY  05-16-2004  . PERCUTANEOUS CORONARY STENT INTERVENTION (PCI-S) N/A 06/28/2012   Procedure: PERCUTANEOUS CORONARY STENT INTERVENTION (PCI-S);  Surgeon: Sherren Mocha, MD;  Location: [facility] CATH LAB;  Service: Cardiovascular;  Laterality: N/A;    Family History  Problem Relation Age of Onset  . Lung cancer Sister   . Cancer Sister        lung  . Cancer Mother   . Cancer Father        died in his 35s.  . Cancer Brother        lung  . Coronary artery disease Other   . Diabetes Other   . Colon cancer Other   . Cancer Sister        lung    Social History   Socioeconomic History  . Marital status: Married    Spouse name: Not on file  . Number of children: Not on file  . Years of education: Not on file  . Highest education level: Not on file  Occupational  History  . Not on file  Tobacco Use  . Smoking status: Former Smoker    Packs/day: 2.00    Years: 40.00    Pack years: 80.00    Types: Cigarettes    Quit date: 08/29/1996    Years since quitting: 23.7  . Smokeless tobacco: Never Used  Vaping Use  . Vaping Use: Never used  Substance and Sexual Activity  . Alcohol use: Yes    Alcohol/week: 8.0 standard drinks    Types: 8 Cans of beer per week    Comment: 2 beers every other day  . Drug use: No  . Sexual activity: Yes  Other Topics Concern  . Not on file  Social History Narrative  Lives in [place] with wife, son, and son's family.   Social Determinants of Health   Financial Resource Strain:   . Difficulty of Paying Living Expenses: Not on file  Food Insecurity:   . Worried About Charity fundraiser in the Last Year: Not on file  . Ran Out of Food in the Last Year: Not on file  Transportation Needs:   . Lack of Transportation (Medical): Not on file  . Lack of Transportation (Non-Medical): Not on file  Physical Activity:   . Days of Exercise per Week: Not on file  . Minutes of Exercise per Session: Not on file  Stress:   . Feeling of Stress : Not on file  Social Connections:   . Frequency of Communication with Friends and Family: Not on file  . Frequency of Social Gatherings with Friends and Family: Not on file  . Attends Religious Services: Not on file  . Active Member of Clubs or Organizations: Not on file  . Attends Archivist Meetings: Not on file  . Marital Status: Not on file  Intimate Partner Violence:   . Fear of Current or Ex-Partner: Not on file  . Emotionally Abused: Not on file  . Physically Abused: Not on file  . Sexually Abused: Not on file     Physical Exam   Vitals:   06/01/20 1451 06/01/20 1826  BP: (!) 139/59 (!) 105/43  Pulse: 73 63  Resp: 20 17  Temp: 98.2 F (36.8 C)   SpO2: 100% 100%    CONSTITUTIONAL: Well-appearing, NAD NEURO:  Alert and oriented x 3, no focal deficits EYES:   eyes equal and reactive ENT/NECK:  no LAD, no JVD CARDIO: Regular rate, well-perfused, normal S1 and S2 PULM:  CTAB no wheezing or rhonchi GI/GU:  normal bowel sounds, non-distended, non-tender MSK/SPINE:  No gross deformities, no edema SKIN:  no rash, atraumatic PSYCH:  Appropriate speech and behavior  *Additional and/or pertinent findings included in MDM below  Diagnostic and Interventional Summary    EKG Interpretation  Date/Time:    Ventricular Rate:    PR Interval:    QRS Duration:   QT Interval:    QTC Calculation:   R Axis:     Text Interpretation:        Labs Reviewed  CBC WITH DIFFERENTIAL/PLATELET - Abnormal; Notable for the following components:      Result Value   RBC 3.10 (*)    Hemoglobin 7.6 (*)    HCT 26.8 (*)    MCH 24.5 (*)    MCHC 28.4 (*)    RDW 21.0 (*)    Platelets 137 (*)    Lymphs Abs 0.5 (*)    All other components within normal limits  COMPREHENSIVE METABOLIC PANEL - Abnormal; Notable for the following components:   Glucose, Bld 149 (*)    BUN 24 (*)    Calcium 8.5 (*)    All other components within normal limits    No orders to display    Medications - No data to display   Procedures  /  Critical Care Procedures  ED Course and Medical Decision Making  I have reviewed the triage vital signs, the nursing notes, and pertinent available records from the EMR.  Listed above are laboratory and imaging tests that I personally ordered, reviewed, and interpreted and then considered in my medical decision making (see below for details).  No evidence of JVD or shortness of breath or orthopnea to suggest a cardiac  etiology of patient's leg swelling.  This is a chronic problem that is subacutely worsening over the past 1 or 2 weeks.  No evidence of infection in the legs, the swelling is symmetric and I doubt DVT.  Labs are largely at baseline, somewhat decreased hemoglobin level but patient has a history of iron deficiency anemia.  Denies any  bleeding, no black stool.  Single documented fever of 100.6 upon arrival the patient denies subjective fever, no infectious symptoms, has had no return of fever during the emergency department visit.  Also some documented low blood pressures though on my evaluation it seems that patient was poorly fitted with an extra large blood pressure cuff.  With replacement of appropriate cuff I found patient's blood pressure to be 160/110.  Doubt emergent process, appropriate for discharge with PCP follow-up.       Barth Kirks. [place] Small, [place] [email or web address]  Final Clinical Impressions(s) / ED Diagnoses     ICD-10-CM   1. Peripheral edema  R60.9     ED Discharge Orders    None       Discharge Instructions Discussed with and Provided to Patient:     Discharge Instructions     You were evaluated in the Emergency Department and after careful evaluation, we did not find any emergent condition requiring admission or further testing in the hospital.  Your exam/testing today was overall reassuring.  We suspect your swelling is due to the veins in your legs.  As discussed, it is important to elevate these legs as much as you can and use compressive socks or stockings throughout the day.  Please follow-up with your regular doctor to discuss your daily medications.  Please return to the Emergency Department if you experience any worsening of your condition.  Thank you for allowing [place] to be a part of your care.       Maudie Flakes, MD 06/01/20 [phone]

## 2020-06-01 NOTE — ED Notes (Signed)
Discussed use of Ace wraps at home, including wrap technique, correction for pressure issues, hygiene.

## 2020-06-01 NOTE — Discharge Instructions (Addendum)
You were evaluated in the Emergency Department and after careful evaluation, we did not find any emergent condition requiring admission or further testing in the hospital.  Your exam/testing today was overall reassuring.  We suspect your swelling is due to the veins in your legs.  As discussed, it is important to elevate these legs as much as you can and use compressive socks or stockings throughout the day.  Please follow-up with your regular doctor to discuss your daily medications.  Please return to the Emergency Department if you experience any worsening of your condition.  Thank you for allowing [place] to be a part of your care.

## 2020-06-09 ENCOUNTER — Telehealth: Payer: Self-pay | Admitting: Podiatry

## 2020-06-09 NOTE — Telephone Encounter (Signed)
Pt left message returning a call about making an appt to pick up shoes.   I returned call and left message that I would know for sure tomorrow if I will have any available appts this week and will call him tomorrow. His auth does expire 10.15.2021.Marland Kitchen

## 2020-06-10 ENCOUNTER — Ambulatory Visit: Payer: Medicare HMO | Admitting: Physician Assistant

## 2020-06-15 ENCOUNTER — Other Ambulatory Visit: Payer: Self-pay | Admitting: Internal Medicine

## 2020-06-15 ENCOUNTER — Other Ambulatory Visit: Payer: Self-pay | Admitting: Radiology

## 2020-06-16 ENCOUNTER — Encounter (HOSPITAL_COMMUNITY): Payer: Self-pay

## 2020-06-16 ENCOUNTER — Ambulatory Visit (HOSPITAL_COMMUNITY)
Admission: RE | Admit: 2020-06-16 | Discharge: 2020-06-16 | Disposition: A | Discharge status: Home / Self Care | Payer: Medicare HMO | Source: Ambulatory Visit | Attending: Interventional Radiology | Admitting: Interventional Radiology

## 2020-06-16 ENCOUNTER — Other Ambulatory Visit (HOSPITAL_COMMUNITY): Payer: Self-pay | Admitting: Interventional Radiology

## 2020-06-16 ENCOUNTER — Encounter (HOSPITAL_COMMUNITY)
Admission: RE | Admit: 2020-06-16 | Discharge: 2020-06-16 | Disposition: A | Discharge status: Home / Self Care | Payer: Medicare HMO | Source: Ambulatory Visit | Attending: Interventional Radiology | Admitting: Interventional Radiology

## 2020-06-16 ENCOUNTER — Other Ambulatory Visit: Payer: Self-pay

## 2020-06-16 DIAGNOSIS — C220 Liver cell carcinoma: Secondary | ICD-10-CM

## 2020-06-16 DIAGNOSIS — I509 Heart failure, unspecified: Secondary | ICD-10-CM | POA: Diagnosis not present

## 2020-06-16 DIAGNOSIS — D509 Iron deficiency anemia, unspecified: Secondary | ICD-10-CM | POA: Diagnosis not present

## 2020-06-16 DIAGNOSIS — Z7984 Long term (current) use of oral hypoglycemic drugs: Secondary | ICD-10-CM | POA: Insufficient documentation

## 2020-06-16 DIAGNOSIS — Z79899 Other long term (current) drug therapy: Secondary | ICD-10-CM | POA: Insufficient documentation

## 2020-06-16 DIAGNOSIS — E785 Hyperlipidemia, unspecified: Secondary | ICD-10-CM | POA: Insufficient documentation

## 2020-06-16 DIAGNOSIS — G4733 Obstructive sleep apnea (adult) (pediatric): Secondary | ICD-10-CM | POA: Insufficient documentation

## 2020-06-16 DIAGNOSIS — Z955 Presence of coronary angioplasty implant and graft: Secondary | ICD-10-CM | POA: Diagnosis not present

## 2020-06-16 DIAGNOSIS — K7030 Alcoholic cirrhosis of liver without ascites: Secondary | ICD-10-CM | POA: Diagnosis not present

## 2020-06-16 DIAGNOSIS — I2510 Atherosclerotic heart disease of native coronary artery without angina pectoris: Secondary | ICD-10-CM | POA: Diagnosis not present

## 2020-06-16 DIAGNOSIS — K219 Gastro-esophageal reflux disease without esophagitis: Secondary | ICD-10-CM | POA: Insufficient documentation

## 2020-06-16 DIAGNOSIS — I110 Hypertensive heart disease with heart failure: Secondary | ICD-10-CM | POA: Insufficient documentation

## 2020-06-16 DIAGNOSIS — E119 Type 2 diabetes mellitus without complications: Secondary | ICD-10-CM | POA: Insufficient documentation

## 2020-06-16 HISTORY — PX: IR ANGIOGRAM SELECTIVE EACH ADDITIONAL VESSEL: IMG667

## 2020-06-16 HISTORY — PX: IR US GUIDE VASC ACCESS RIGHT: IMG2390

## 2020-06-16 HISTORY — PX: IR EMBO ARTERIAL NOT HEMORR HEMANG INC GUIDE ROADMAPPING: IMG5448

## 2020-06-16 HISTORY — PX: IR ANGIOGRAM VISCERAL SELECTIVE: IMG657

## 2020-06-16 LAB — CBC WITH DIFFERENTIAL/PLATELET
Abs Immature Granulocytes: 0.01 10*3/uL (ref 0.00–0.07)
Basophils Absolute: 0 10*3/uL (ref 0.0–0.1)
Basophils Relative: 1 %
Eosinophils Absolute: 0.1 10*3/uL (ref 0.0–0.5)
Eosinophils Relative: 3 %
HCT: 28.8 % — ABNORMAL LOW (ref 39.0–52.0)
Hemoglobin: 8.3 g/dL — ABNORMAL LOW (ref 13.0–17.0)
Immature Granulocytes: 0 %
Lymphocytes Relative: 18 %
Lymphs Abs: 0.9 10*3/uL (ref 0.7–4.0)
MCH: 24.1 pg — ABNORMAL LOW (ref 26.0–34.0)
MCHC: 28.8 g/dL — ABNORMAL LOW (ref 30.0–36.0)
MCV: 83.5 fL (ref 80.0–100.0)
Monocytes Absolute: 0.3 10*3/uL (ref 0.1–1.0)
Monocytes Relative: 7 %
Neutro Abs: 3.4 10*3/uL (ref 1.7–7.7)
Neutrophils Relative %: 71 %
Platelets: 229 10*3/uL (ref 150–400)
RBC: 3.45 MIL/uL — ABNORMAL LOW (ref 4.22–5.81)
RDW: 18.2 % — ABNORMAL HIGH (ref 11.5–15.5)
WBC: 4.8 10*3/uL (ref 4.0–10.5)
nRBC: 0 % (ref 0.0–0.2)

## 2020-06-16 LAB — COMPREHENSIVE METABOLIC PANEL
ALT: 25 U/L (ref 0–44)
AST: 28 U/L (ref 15–41)
Albumin: 3.8 g/dL (ref 3.5–5.0)
Alkaline Phosphatase: 143 U/L — ABNORMAL HIGH (ref 38–126)
Anion gap: 10 (ref 5–15)
BUN: 21 mg/dL (ref 8–23)
CO2: 25 mmol/L (ref 22–32)
Calcium: 8.7 mg/dL — ABNORMAL LOW (ref 8.9–10.3)
Chloride: 102 mmol/L (ref 98–111)
Creatinine, Ser: 0.94 mg/dL (ref 0.61–1.24)
GFR, Estimated: >60 mL/min (ref >60)
Glucose, Bld: 134 mg/dL — ABNORMAL HIGH (ref 70–99)
Potassium: 3.8 mmol/L (ref 3.5–5.1)
Sodium: 137 mmol/L (ref 135–145)
Total Bilirubin: 0.8 mg/dL (ref 0.3–1.2)
Total Protein: 7.3 g/dL (ref 6.5–8.1)

## 2020-06-16 LAB — PROTIME-INR
INR: 1.2 (ref 0.8–1.2)
Prothrombin Time: 14.8 seconds (ref 11.4–15.2)

## 2020-06-16 LAB — GLUCOSE, CAPILLARY: Glucose-Capillary: 134 mg/dL — ABNORMAL HIGH (ref 70–99)

## 2020-06-16 MED ORDER — IOHEXOL 300 MG/ML  SOLN
100.0000 mL | Freq: Once | INTRAMUSCULAR | Status: AC | PRN
  Administered 2020-06-16: 5 mL via INTRA_ARTERIAL

## 2020-06-16 MED ORDER — TECHNETIUM TO 99M ALBUMIN AGGREGATED
4.4000 | Freq: Once | INTRAVENOUS | Status: AC | PRN
  Administered 2020-06-16: 4.4 via INTRAVENOUS

## 2020-06-16 MED ORDER — SODIUM CHLORIDE 0.9 % IV SOLN: INTRAVENOUS | Status: DC

## 2020-06-16 MED ORDER — LIDOCAINE HCL 1 % IJ SOLN
INTRAMUSCULAR | Status: AC
  Filled 2020-06-16: qty 20

## 2020-06-16 MED ORDER — LIDOCAINE HCL (PF) 1 % IJ SOLN
INTRAMUSCULAR | Status: AC | PRN
  Administered 2020-06-16: 5 mL

## 2020-06-16 MED ORDER — MIDAZOLAM HCL 2 MG/2ML IJ SOLN
INTRAMUSCULAR | Status: AC | PRN
  Administered 2020-06-16 (×3): 1 mg via INTRAVENOUS

## 2020-06-16 MED ORDER — ONDANSETRON HCL 4 MG/2ML IJ SOLN
INTRAMUSCULAR | Status: AC
  Filled 2020-06-16: qty 2

## 2020-06-16 MED ORDER — IOHEXOL 300 MG/ML  SOLN
100.0000 mL | Freq: Once | INTRAMUSCULAR | Status: AC | PRN
  Administered 2020-06-16: 60 mL via INTRA_ARTERIAL

## 2020-06-16 MED ORDER — FENTANYL CITRATE (PF) 100 MCG/2ML IJ SOLN
INTRAMUSCULAR | Status: AC | PRN
  Administered 2020-06-16 (×2): 50 ug via INTRAVENOUS

## 2020-06-16 MED ORDER — MIDAZOLAM HCL 2 MG/2ML IJ SOLN
INTRAMUSCULAR | Status: AC
  Filled 2020-06-16: qty 4

## 2020-06-16 MED ORDER — IOHEXOL 300 MG/ML  SOLN
100.0000 mL | Freq: Once | INTRAMUSCULAR | Status: AC | PRN
  Administered 2020-06-16: 100 mL via INTRA_ARTERIAL

## 2020-06-16 MED ORDER — FENTANYL CITRATE (PF) 100 MCG/2ML IJ SOLN
INTRAMUSCULAR | Status: DC
Start: 2020-06-16 — End: 2020-06-16
  Filled 2020-06-16: qty 2

## 2020-06-16 NOTE — Procedures (Signed)
Interventional Radiology Procedure Note  Procedure:    US guided access right CFA Mesenteric angiogram, for y90 mapping Coil embolization of the right gastric artery Super selective catheter to the segment 8 branch. MAA delivery to the segment 8 branch Exoseal delivery for hemostasis  Complications: None  Recommendations:  - right hip straight x 4 hours - advance diet - to NM for scan - Do not submerge for 7 days - Routine care   Signed,  Dulcy Fanny. Earleen Newport, DO

## 2020-06-16 NOTE — Sedation Documentation (Signed)
Transferred to nuc med

## 2020-06-16 NOTE — Discharge Instructions (Signed)
Please call Interventional Radiology clinic [phone] with any questions or concerns.  You may remove your dressing and shower tomorrow.   Hepatic Artery Radioembolization, Care After This sheet gives you information about how to care for yourself after your procedure. Your health care provider may also give you more specific instructions. If you have problems or questions, contact your health care provider. What can I expect after the procedure? After the procedure, it is common to have:  A slight fever for 1-2 weeks. If your fever gets worse, tell your health care provider.  Fatigue.  Loss of appetite. This should gradually improve after about 1 week.  Abdominal pain on your right side.  Soreness and tenderness in your groin area where the needle and catheter were placed (puncture site). Follow these instructions at home:  Puncture site care  Follow instructions from your health care provider about how to take care of the puncture site. Make sure you: ? Wash your hands with soap and water before you change your bandage (dressing). If soap and water are not available, use hand sanitizer. ? Change your dressing as told by your health care provider. ? Leave stitches (sutures), skin glue, or adhesive strips in place. These skin closures may need to stay in place for 2 weeks or longer. If adhesive strip edges start to loosen and curl up, you may trim the loose edges. Do not remove adhesive strips completely unless your health care provider tells you to do that.  Check your puncture site every day for signs of infection. Check for: ? More redness, swelling, or pain. ? More fluid or blood. ? Warmth. ? Pus or a bad smell. Activity  Rest and return to your normal activities as told by your health care provider. Ask your health care provider what activities are safe for you.  Do not drive for 24 hours after the procedure if you were given a medicine to help you relax (sedative).  Do  not lift anything that is heavier than 10 lb (4.5 kg) until your health care provider says that it is safe. Medicines  Take over-the-counter and prescription medicines only as told by your health care provider.  Do not drive or use heavy machinery while taking prescription pain medicine.  General instructions  To prevent or treat constipation while you are taking prescription pain medicine, your health care provider may recommend that you: ? Drink enough fluid to keep your urine clear or pale yellow. ? Take over-the-counter or prescription medicines. ? Eat foods that are high in fiber, such as fresh fruits and vegetables, whole grains, and beans. ? Limit foods that are high in fat and processed sugars, such as fried and sweet foods.  Eat frequent small meals until your appetite returns. Follow instructions from your health care provider about eating or drinking restrictions.  Do not take baths, swim, or use a hot tub until your health care provider approves. You may take showers. Wash your puncture site with mild soap and water and pat the area dry.  Wear compression stockings as told by your health care provider. These stockings help to prevent blood clots and reduce swelling in your legs.  Keep all follow-up visits as told by your health care provider. This is important. You may need to have blood tests and imaging tests done. Contact a health care provider if:  You have more redness, swelling, or pain around your puncture site.  You have more fluid or blood coming from your puncture site.  Your puncture site feels warm to the touch.  You have pus or a bad smell coming from your puncture site.  You have pain that: ? Gets worse. ? Does not get better with medicine. ? Feels like very bad heartburn. ? Is in the middle of your abdomen, above your belly button.  Your skin and the white parts of your eyes turn yellow (jaundice).  The color of your urine changes to dark  brown.  The color of your stool changes to light yellow.  Your abdominal measurement (girth) increases in a short period of time.  You gain more than 5 lb (2.3 kg) in a short period of time. Get help right away if:  You have a fever that lasts longer than 2 weeks or is higher than what your health care provider told you to expect.  You develop any of the following in your legs: ? Pain. ? Swelling. ? Skin that is cold or pale or turns blue.  You have chest pain.  You have blood in your vomit, saliva, or stool.  You have trouble breathing. This information is not intended to replace advice given to you by your health care provider. Make sure you discuss any questions you have with your health care provider. Document Revised: 07/28/2017 Document Reviewed: 05/14/2016 Elsevier Patient Education  [place].   Moderate Conscious Sedation, Adult, Care After These instructions provide you with information about caring for yourself after your procedure. Your health care provider may also give you more specific instructions. Your treatment has been planned according to current medical practices, but problems sometimes occur. Call your health care provider if you have any problems or questions after your procedure. What can I expect after the procedure? After your procedure, it is common:  To feel sleepy for several hours.  To feel clumsy and have poor balance for several hours.  To have poor judgment for several hours.  To vomit if you eat too soon. Follow these instructions at home: For at least 24 hours after the procedure:   Do not: ? Participate in activities where you could fall or become injured. ? Drive. ? Use heavy machinery. ? Drink alcohol. ? Take sleeping pills or medicines that cause drowsiness. ? Make important decisions or sign legal documents. ? Take care of children on your own.  Rest. Eating and drinking  Follow the diet recommended by your health  care provider.  If you vomit: ? Drink water, juice, or soup when you can drink without vomiting. ? Make sure you have little or no nausea before eating solid foods. General instructions  Have a responsible adult stay with you until you are awake and alert.  Take over-the-counter and prescription medicines only as told by your health care provider.  If you smoke, do not smoke without supervision.  Keep all follow-up visits as told by your health care provider. This is important. Contact a health care provider if:  You keep feeling nauseous or you keep vomiting.  You feel light-headed.  You develop a rash.  You have a fever. Get help right away if:  You have trouble breathing. This information is not intended to replace advice given to you by your health care provider. Make sure you discuss any questions you have with your health care provider. Document Revised: 07/28/2017 Document Reviewed: 12/05/2015 Elsevier Patient Education  [place] on 06/30/20  Post Y-90 Radioembolization Discharge Instructions  You have been given  a radioactive material during your procedure.  While it is safe for you to be discharged home from the hospital, you need to proceed directly home.    Do not use public transportation, including air travel, lasting more than 2 hours for 1 week.  Avoid crowded public places for 1 week.  Adult visitors should try to avoid close contact with you for 1 week.    Children and pregnant females should not visit or have close contact with you for 1 week.  Items that you touch are not radioactive.  Do not sleep in the same bed as your partner for 1 week, and a condom should be used for sexual activity during the first 24 hours.  Your blood may be radioactive and caution should be used if any bleeding occurs during the recovery period.  Body fluids may be radioactive for 24 hours.  Wash your hands after voiding.  Men should  sit to urinate.  Dispose of any soiled materials (flush down toilet or place in trash at home) during the first day.  Drink 6 to 8 glasses of fluids per day for 5 days to hydrate yourself.  If you need to see a doctor during the first week, you must let them know that you were treated with yttrium-90 microspheres, and will be slightly radioactive.  They can call Interventional Radiology [phone] with any questions.

## 2020-06-16 NOTE — H&P (Signed)
Referring Physician(s): Outlaw,W  Supervising Physician: Corrie Mckusick  Patient Status:  [facility] OP  Chief Complaint: Hepatocellular carcinoma   Subjective: Patient familiar to IR service from consultation with Dr. Earleen Newport on 05/06/2020 to discuss treatment options for a 4 cm right hepatocellular carcinoma by imaging criteria.  He has a history of alcoholic cirrhosis along with portal hypertension, splenomegaly and esophageal/ gastric varices.  Following discussions with Dr. Earleen Newport he was deemed an appropriate candidate for Y 90 hepatic radioembolization and presents today for preplanning hepatic/visceral arteriogram with embolization and test Y 90 dosing.  He currently denies fever, headache, worsening chest pain, cough, abdominal pain, nausea, vomiting or bleeding.  He does have some dyspnea with exertion and back pain, occ constipation.   He is an ex-smoker.  Additional medical history as below.  Past Medical History:  Diagnosis Date  . Arthritis    "joints tighten up sometimes" (03/27/2104)  . CHF (congestive heart failure) ([place])   . Chronic lower back pain   . Coronary artery disease    a. 05/2012 Cath/PCI: LM 40ost, 50-60d, LAD 99p ruptured plaque (3.0x28 DES), LCX 50p/m, RCA 30-40p, 51m EF 65-70%.  . Coronary artery disease involving native coronary artery of native heart without angina pectoris    Severe left main disease at catheterization July 2015  CABG x3 with a LIMA to the LAD, SVG to the OM, SVG to the PDA on 03/31/14. EF 60% by cath.   . GERD without esophagitis 08/04/2010  . Hepatic cirrhosis ([place]    a. Dx 01/2014 - CT a/p   . History of blood transfusion    "related to bleeding ulcers"  . History of concussion    1976--  NO RESIDUAL  . History of GI bleed    a. UGIB 07/2012;  b. 01/2014 admission with GIB/FOB stool req 1U prbc's->EGD showed portal gastropathy, barrett's esoph, and chronic active h. pylori gastritis.  .Marland KitchenHistory of gout    2007 &  2008  LEFT LEG-- NO ISSUE  SINCE  . Hyperlipidemia   . Iron deficiency anemia   . Kidney stones   . OA (osteoarthritis of spine)    LOWER BACK--  INTERMITTANT LEFT LEG NUMBNESS  . OSA (obstructive sleep apnea)    PULMOLOGIST-  DR CLANCE--  MODERATE OSA  STARTED CPAP 2012--  BUT CURRENTLY HAS NOT USED PAST 6 MONTHS  . Phimosis    a. s/p circumcision 2015.  . Type 2 diabetes mellitus ([place]   . Unspecified essential hypertension    Past Surgical History:  Procedure Laterality Date  . ANKLE FRACTURE SURGERY Right 1989   "plate put in"  . APPENDECTOMY  05-16-2004   open  . CIRCUMCISION N/A 09/09/2013   Procedure: CIRCUMCISION ADULT;  Surgeon: DBernestine Amass MD;  [facility]  Service: Urology;  Laterality: N/A;  . COLECTOMY  05-16-2004  . CORONARY ANGIOPLASTY WITH STENT PLACEMENT  06/28/2012  DR COOPER   PCI W/  X1 DES to PMadison LAD/  LM  40% OSTIAL & 50-60% DISTAL /  50% PROX LCX/  30-40% PROX RCA & 50% MID RCA/   LVEF 65-70%  . CORONARY ARTERY BYPASS GRAFT N/A 03/31/2014   Procedure: CORONARY ARTERY BYPASS GRAFTING (CABG) times 3 using left internal mammary artery and right saphenous vein.;  Surgeon: SMelrose Nakayama MD;  [place]  Service: Open Heart Surgery;  Laterality: N/A;  . DOBUTAMINE STRESS ECHO  06-08-2012   MODERATE HYPOKINESIS/ ISCHEMIA MID INFERIOR WALL  .  ESOPHAGOGASTRODUODENOSCOPY  08/15/2012   Procedure: ESOPHAGOGASTRODUODENOSCOPY (EGD);  Surgeon: Wonda Horner, MD;  Location: [facility] ENDOSCOPY;  Service: Endoscopy;  Laterality: N/A;  . ESOPHAGOGASTRODUODENOSCOPY N/A 02/17/2014   Procedure: ESOPHAGOGASTRODUODENOSCOPY (EGD);  Surgeon: Jeryl Columbia, MD;  Location[facility] ENDOSCOPY;  Service: Endoscopy;  Laterality: N/A;  . ESOPHAGOGASTRODUODENOSCOPY N/A 04/17/2020   Procedure: ESOPHAGOGASTRODUODENOSCOPY (EGD);  Surgeon: Ronnette Juniper, MD;  Location: [place];  Service: Gastroenterology;  Laterality: N/A;  . ESOPHAGOGASTRODUODENOSCOPY (EGD) WITH PROPOFOL N/A 09/30/2017    Procedure: ESOPHAGOGASTRODUODENOSCOPY (EGD) WITH PROPOFOL;  Surgeon: Wonda Horner, MD;  Location: [facility] ENDOSCOPY;  Service: Endoscopy;  Laterality: N/A;  . ESOPHAGOGASTRODUODENOSCOPY (EGD) WITH PROPOFOL N/A 12/20/2017   Procedure: ESOPHAGOGASTRODUODENOSCOPY (EGD) WITH PROPOFOL;  Surgeon: Clarene Essex, MD;  Location: [place];  Service: Endoscopy;  Laterality: N/A;  . HERNIA REPAIR    . INTRAOPERATIVE TRANSESOPHAGEAL ECHOCARDIOGRAM N/A 03/31/2014   Procedure: INTRAOPERATIVE TRANSESOPHAGEAL ECHOCARDIOGRAM;  Surgeon: Melrose Nakayama, MD;  Location: [place];  Service: Open Heart Surgery;  Laterality: N/A;  . IR RADIOLOGIST EVAL & MGMT  05/06/2020  . IR RADIOLOGIST EVAL & MGMT  05/20/2020  . LAPAROSCOPIC UMBILICAL HERNIA REPAIR W/ MESH  06-06-2011  . LEFT HEART CATHETERIZATION WITH CORONARY ANGIOGRAM N/A 03/28/2014   Procedure: LEFT HEART CATHETERIZATION WITH CORONARY ANGIOGRAM;  Surgeon: Sinclair Grooms, MD;  Location: [facility] CATH LAB;  Service: Cardiovascular;  Laterality: N/A;  . LIPOMA EXCISION Left 08/25/2017   Procedure: EXCISION LIPOMA LEFT POSTERIOR THIGH;  Surgeon: Clovis Riley, MD;  Location: [place];  Service: General;  Laterality: Left;  . NEPHROLITHOTOMY  1990'S  . OPEN APPENDECTOMY W/ PARTIAL CECECTOMY  05-16-2004  . PERCUTANEOUS CORONARY STENT INTERVENTION (PCI-S) N/A 06/28/2012   Procedure: PERCUTANEOUS CORONARY STENT INTERVENTION (PCI-S);  Surgeon: Sherren Mocha, MD;  Location: [facility] CATH LAB;  Service: Cardiovascular;  Laterality: N/A;      Allergies: Patient has no known allergies.  Medications: Prior to Admission medications   Medication Sig Start Date End Date Taking? Authorizing Provider  Accu-Chek Softclix Lancets lancets USE AS DIRECTED TO TEST BLOOD SUGAR FOUR TIMES DAILY 03/13/20  Yes [name], MD  albuterol (VENTOLIN HFA) 108 (90 Base) MCG/ACT inhaler Inhale 2 puffs into the lungs every 6 (six) hours as needed for wheezing or shortness of breath. 03/12/20   Yes [name], MD  atorvastatin (LIPITOR) 40 MG tablet TAKE 1 TABLET BY MOUTH DAILY AT 6 PM 05/13/20  Yes [name], MD  blood glucose meter kit and supplies Dispense based on patient and insurance preference. Use up to four times daily as directed. (FOR ICD-10 E10.9, E11.9). 03/12/20  Yes [name], MD  diazepam (VALIUM) 5 MG tablet Take 5 mg by mouth 2 (two) times daily as needed. 05/12/20  Yes [provider]  ferrous sulfate 325 (65 FE) MG EC tablet Take 1 tablet (325 mg total) by mouth daily with breakfast. 01/16/20  Yes [name], MD  gabapentin (NEURONTIN) 100 MG capsule TAKE 1 CAPSULE(100 MG) BY MOUTH TWICE DAILY 05/14/19  Yes [name], MD  metFORMIN (GLUCOPHAGE) 850 MG tablet TAKE 1 TABLET(850 MG) BY MOUTH TWICE DAILY WITH A MEAL 06/16/20  Yes [name], MD  pantoprazole (PROTONIX) 40 MG tablet Take 1 tablet (40 mg total) by mouth daily. 04/23/20  Yes Pokhrel, Laxman, MD  polyethylene glycol (MIRALAX / GLYCOLAX) 17 g packet Take 17 g by mouth daily as needed for moderate constipation. 04/23/20  Yes Pokhrel, Corrie Mckusick, MD  spironolactone (  ALDACTONE) 25 MG tablet Take 1 tablet (25 mg total) by mouth daily. 01/04/20  Yes Nita Sells, MD  torsemide (DEMADEX) 20 MG tablet Take 40 mg (2 pills) every morning and every afternoon except Mon, Wed, Fri afternoons take only 20 mg (1 pill) 02/06/20  Yes Nahser, Wonda Cheng, MD  umeclidinium-vilanterol (ANORO ELLIPTA) 62.5-25 MCG/INH AEPB Inhale 1 puff into the lungs daily. 03/12/20  Yes [name], MD  nitroGLYCERIN (NITROSTAT) 0.4 MG SL tablet Place 1 tablet (0.4 mg total) under the tongue every 5 (five) minutes as needed for chest pain. 06/08/16   Nahser, Wonda Cheng, MD     Vital Signs: BP (!) 166/67   Pulse 75   Temp 98.1 F (36.7 C) (Oral)   Resp 18   Ht _0  (1.727 m)   SpO2 100%   BMI 35.73 kg/m   Physical Exam awake, alert.  Chest clear to auscultation  bilaterally.  Heart with regular rate and rhythm.  Abdomen protuberant, soft, positive bowel sounds, nontender.  Bilateral lower extremity edema noted.  Imaging: No results found.  Labs:  CBC: Recent Labs    04/23/20 0417 05/06/20 0954 06/01/20 1414 06/16/20 0845  WBC 3.8* 3.7* 5.0 4.8  HGB 8.0* 8.8* 7.6* 8.3*  HCT 28.1* 28.9* 26.8* 28.8*  PLT 129* 179 137* 229    COAGS: Recent Labs    11/01/19 0603 01/03/20 0518 04/15/20 2102  INR 1.2 1.2 1.1    BMP: Recent Labs    04/21/20 0840 04/22/20 0953 04/23/20 0417 06/01/20 1414  NA 136 137 139 141  K 4.4 4.4 4.0 3.7  CL 104 103 103 102  CO2 _1 GLUCOSE 135* 190* 105* 149*  BUN _2 24*  CALCIUM 9.1 9.1 8.7* 8.5*  CREATININE 1.20 1.27* 1.49* 1.06  GFRNONAA 59* 55* 46* >60  GFRAA >60 >60 53* >60    LIVER FUNCTION TESTS: Recent Labs    04/18/20 0213 04/21/20 0840 04/23/20 0417 06/01/20 1414  BILITOT 1.5* 1.2 0.9 1.0  AST _3 ALT _4 ALKPHOS 94 115 99 112  PROT 6.1* 6.7 6.2* 6.9  ALBUMIN 3.2* 3.4* 3.2* 3.6    Assessment and Plan: 74 year old male with past medical history significant for alcoholic cirrhosis, portal hypertension, splenomegaly, gastric/esophageal varices, CHF, coronary artery disease diabetes, obstructive sleep apnea, osteoarthritis, nephrolithiasis, anemia, hyperlipidemia, gout, GERD and 4 cm right hepatocellular carcinoma by imaging criteria.  He was evaluated in consultation with Dr. Earleen Newport on 05/06/2020 and deemed an appropriate candidate for hepatic Y 90 radioembolization of the [facility].  He presents today for preplanning hepatic/visceral arteriogram with embolization and test Y 90 dosing.Risks and benefits of procedure were discussed with the patient including, but not limited to bleeding, infection, vascular injury or contrast induced renal failure.  This interventional procedure involves the use of X-rays and because of the nature of the planned procedure, it is  possible that we will have prolonged use of X-ray fluoroscopy.  Potential radiation risks to you include (but are not limited to) the following: - A slightly elevated risk for cancer  several years later in life. This risk is typically less than 0.5% percent. This risk is low in comparison to the normal incidence of human cancer, which is 33% for women and 50% for men according to the [employer]. - Radiation induced injury can include skin redness, resembling a rash, tissue breakdown / ulcers and hair loss (which can be  temporary or permanent).   The likelihood of either of these occurring depends on the difficulty of the procedure and whether you are sensitive to radiation due to previous procedures, disease, or genetic conditions.   IF your procedure requires a prolonged use of radiation, you will be notified and given written instructions for further action.  It is your responsibility to monitor the irradiated area for the 2 weeks following the procedure and to notify your physician if you are concerned that you have suffered a radiation induced injury.    All of the patient's questions were answered, patient is agreeable to proceed.  Consent signed and in chart.      Electronically Signed: D. Rowe Robert, PA-C 06/16/2020, 8:56 AM   I spent a total of 25 minutes at the the patient's bedside AND on the patient's hospital floor or unit, greater than 50% of which was counseling/coordinating care for hepatic/visceral arteriogram with embolization and test Y 90 dosing

## 2020-06-29 ENCOUNTER — Other Ambulatory Visit: Payer: Self-pay | Admitting: Radiology

## 2020-06-30 ENCOUNTER — Other Ambulatory Visit (HOSPITAL_COMMUNITY): Payer: Self-pay | Admitting: Interventional Radiology

## 2020-06-30 ENCOUNTER — Encounter (HOSPITAL_COMMUNITY)
Admission: RE | Admit: 2020-06-30 | Discharge: 2020-06-30 | Disposition: A | Discharge status: Home / Self Care | Payer: Medicare HMO | Source: Ambulatory Visit | Attending: Interventional Radiology | Admitting: Interventional Radiology

## 2020-06-30 ENCOUNTER — Ambulatory Visit (HOSPITAL_COMMUNITY)
Admission: RE | Admit: 2020-06-30 | Discharge: 2020-06-30 | Disposition: A | Discharge status: Home / Self Care | Payer: Medicare HMO | Source: Ambulatory Visit | Attending: Interventional Radiology | Admitting: Interventional Radiology

## 2020-06-30 ENCOUNTER — Encounter (HOSPITAL_COMMUNITY): Payer: Self-pay

## 2020-06-30 ENCOUNTER — Other Ambulatory Visit: Payer: Self-pay

## 2020-06-30 DIAGNOSIS — K7030 Alcoholic cirrhosis of liver without ascites: Secondary | ICD-10-CM | POA: Diagnosis not present

## 2020-06-30 DIAGNOSIS — Z87891 Personal history of nicotine dependence: Secondary | ICD-10-CM | POA: Insufficient documentation

## 2020-06-30 DIAGNOSIS — C220 Liver cell carcinoma: Secondary | ICD-10-CM

## 2020-06-30 DIAGNOSIS — I509 Heart failure, unspecified: Secondary | ICD-10-CM | POA: Diagnosis not present

## 2020-06-30 DIAGNOSIS — K766 Portal hypertension: Secondary | ICD-10-CM | POA: Diagnosis not present

## 2020-06-30 DIAGNOSIS — Z955 Presence of coronary angioplasty implant and graft: Secondary | ICD-10-CM | POA: Insufficient documentation

## 2020-06-30 DIAGNOSIS — R69 Illness, unspecified: Secondary | ICD-10-CM | POA: Diagnosis not present

## 2020-06-30 DIAGNOSIS — I2510 Atherosclerotic heart disease of native coronary artery without angina pectoris: Secondary | ICD-10-CM | POA: Insufficient documentation

## 2020-06-30 HISTORY — PX: IR US GUIDE VASC ACCESS RIGHT: IMG2390

## 2020-06-30 HISTORY — PX: IR ANGIOGRAM SELECTIVE EACH ADDITIONAL VESSEL: IMG667

## 2020-06-30 HISTORY — DX: Liver cell carcinoma: C22.0

## 2020-06-30 HISTORY — PX: IR ANGIOGRAM VISCERAL SELECTIVE: IMG657

## 2020-06-30 HISTORY — PX: IR EMBO TUMOR ORGAN ISCHEMIA INFARCT INC GUIDE ROADMAPPING: IMG5449

## 2020-06-30 LAB — CBC WITH DIFFERENTIAL/PLATELET
Abs Immature Granulocytes: 0.01 10*3/uL (ref 0.00–0.07)
Basophils Absolute: 0 10*3/uL (ref 0.0–0.1)
Basophils Relative: 1 %
Eosinophils Absolute: 0.2 10*3/uL (ref 0.0–0.5)
Eosinophils Relative: 4 %
HCT: 29.2 % — ABNORMAL LOW (ref 39.0–52.0)
Hemoglobin: 8.2 g/dL — ABNORMAL LOW (ref 13.0–17.0)
Immature Granulocytes: 0 %
Lymphocytes Relative: 16 %
Lymphs Abs: 0.9 10*3/uL (ref 0.7–4.0)
MCH: 23 pg — ABNORMAL LOW (ref 26.0–34.0)
MCHC: 28.1 g/dL — ABNORMAL LOW (ref 30.0–36.0)
MCV: 82 fL (ref 80.0–100.0)
Monocytes Absolute: 0.5 10*3/uL (ref 0.1–1.0)
Monocytes Relative: 8 %
Neutro Abs: 3.9 10*3/uL (ref 1.7–7.7)
Neutrophils Relative %: 71 %
Platelets: 145 10*3/uL — ABNORMAL LOW (ref 150–400)
RBC: 3.56 MIL/uL — ABNORMAL LOW (ref 4.22–5.81)
RDW: 17.8 % — ABNORMAL HIGH (ref 11.5–15.5)
WBC: 5.4 10*3/uL (ref 4.0–10.5)
nRBC: 0 % (ref 0.0–0.2)

## 2020-06-30 LAB — GLUCOSE, CAPILLARY: Glucose-Capillary: 115 mg/dL — ABNORMAL HIGH (ref 70–99)

## 2020-06-30 LAB — COMPREHENSIVE METABOLIC PANEL
ALT: 19 U/L (ref 0–44)
AST: 30 U/L (ref 15–41)
Albumin: 4.1 g/dL (ref 3.5–5.0)
Alkaline Phosphatase: 124 U/L (ref 38–126)
Anion gap: 9 (ref 5–15)
BUN: 20 mg/dL (ref 8–23)
CO2: 25 mmol/L (ref 22–32)
Calcium: 8.8 mg/dL — ABNORMAL LOW (ref 8.9–10.3)
Chloride: 103 mmol/L (ref 98–111)
Creatinine, Ser: 0.97 mg/dL (ref 0.61–1.24)
GFR, Estimated: >60 mL/min (ref >60)
Glucose, Bld: 128 mg/dL — ABNORMAL HIGH (ref 70–99)
Potassium: 4.2 mmol/L (ref 3.5–5.1)
Sodium: 137 mmol/L (ref 135–145)
Total Bilirubin: 0.8 mg/dL (ref 0.3–1.2)
Total Protein: 7.8 g/dL (ref 6.5–8.1)

## 2020-06-30 LAB — PROTIME-INR
INR: 1.1 (ref 0.8–1.2)
Prothrombin Time: 13.9 seconds (ref 11.4–15.2)

## 2020-06-30 MED ORDER — MIDAZOLAM HCL 2 MG/2ML IJ SOLN
INTRAMUSCULAR | Status: AC
  Filled 2020-06-30: qty 6

## 2020-06-30 MED ORDER — IOHEXOL 300 MG/ML  SOLN
100.0000 mL | Freq: Once | INTRAMUSCULAR | Status: AC | PRN
  Administered 2020-06-30: 15 mL via INTRA_ARTERIAL

## 2020-06-30 MED ORDER — FENTANYL CITRATE (PF) 100 MCG/2ML IJ SOLN
INTRAMUSCULAR | Status: AC
  Filled 2020-06-30: qty 4

## 2020-06-30 MED ORDER — FENTANYL CITRATE (PF) 100 MCG/2ML IJ SOLN
INTRAMUSCULAR | Status: AC | PRN
Start: 2020-06-30 — End: 2020-06-30
  Administered 2020-06-30 (×2): 50 ug via INTRAVENOUS

## 2020-06-30 MED ORDER — MIDAZOLAM HCL 2 MG/2ML IJ SOLN
INTRAMUSCULAR | Status: AC | PRN
  Administered 2020-06-30 (×2): 1 mg via INTRAVENOUS

## 2020-06-30 MED ORDER — DEXAMETHASONE SODIUM PHOSPHATE 10 MG/ML IJ SOLN
8.0000 mg | Freq: Once | INTRAMUSCULAR | Status: AC
  Administered 2020-06-30: 8 mg via INTRAVENOUS
  Filled 2020-06-30: qty 1

## 2020-06-30 MED ORDER — IOHEXOL 300 MG/ML  SOLN
100.0000 mL | Freq: Once | INTRAMUSCULAR | Status: AC | PRN
  Administered 2020-06-30: 60 mL via INTRA_ARTERIAL

## 2020-06-30 MED ORDER — PANTOPRAZOLE SODIUM 40 MG IV SOLR
40.0000 mg | Freq: Once | INTRAVENOUS | Status: AC
  Administered 2020-06-30: 40 mg via INTRAVENOUS
  Filled 2020-06-30: qty 40

## 2020-06-30 MED ORDER — PIPERACILLIN-TAZOBACTAM 3.375 G IVPB: 3.3750 g | Freq: Once | INTRAVENOUS | Status: AC

## 2020-06-30 MED ORDER — FUROSEMIDE 10 MG/ML IJ SOLN: 40.0000 mg | Freq: Once | INTRAMUSCULAR | Status: AC

## 2020-06-30 MED ORDER — YTTRIUM 90 INJECTION: 23.9000 | INJECTION | Freq: Once | INTRAVENOUS | Status: DC | PRN

## 2020-06-30 MED ORDER — LIDOCAINE HCL (PF) 1 % IJ SOLN
INTRAMUSCULAR | Status: AC | PRN
  Administered 2020-06-30: 5 mL

## 2020-06-30 MED ORDER — SODIUM CHLORIDE 0.9 % IV SOLN: INTRAVENOUS | Status: DC

## 2020-06-30 MED ORDER — SODIUM CHLORIDE 0.9 % IV SOLN
8.0000 mg | Freq: Once | INTRAVENOUS | Status: AC
  Administered 2020-06-30: 8 mg via INTRAVENOUS
  Filled 2020-06-30: qty 4

## 2020-06-30 MED ORDER — FUROSEMIDE 10 MG/ML IJ SOLN
INTRAMUSCULAR | Status: AC
  Administered 2020-06-30: 40 mg via INTRAVENOUS
  Filled 2020-06-30: qty 4

## 2020-06-30 MED ORDER — PIPERACILLIN-TAZOBACTAM 3.375 G IVPB
INTRAVENOUS | Status: AC
  Administered 2020-06-30: 3.375 g via INTRAVENOUS
  Filled 2020-06-30: qty 50

## 2020-06-30 MED ORDER — LIDOCAINE HCL 1 % IJ SOLN
INTRAMUSCULAR | Status: AC
  Filled 2020-06-30: qty 20

## 2020-06-30 NOTE — H&P (Signed)
Chief Complaint: [name] of right hepatic lobe segment 8. Patient presents for Y90.   Referring Physician(s): Crawford,Elizabeth A  Supervising Physician: Corrie Mckusick  Patient Status: [facility] - Out-pt  History of Present Illness: [name] is a 74 y.o. male History of CAD s/p stent placement in 6237, CHF, alcoholic cirrhosis, portal hypertension, esophageal and gastric varices and HCC (4 cm in the right lobe segment 8). Patient initially since on 9.8.21 in clinic with Dr. Earleen Newport for evaluation and deemed an appropriate candidate for  Y90. On 10.19.21 the Patient had a MAA performed to the segment 8 branch with a  coil embolization of the right gastric artery. Patient presents for Y90 to segment 8 of the right hepatic lobe  Past Medical History:  Diagnosis Date  . Arthritis    "joints tighten up sometimes" (03/27/2104)  . CHF (congestive heart failure) ([place])   . Chronic lower back pain   . Coronary artery disease    a. 05/2012 Cath/PCI: LM 40ost, 50-60d, LAD 99p ruptured plaque (3.0x28 DES), LCX 50p/m, RCA 30-40p, 39m EF 65-70%.  . Coronary artery disease involving native coronary artery of native heart without angina pectoris    Severe left main disease at catheterization July 2015  CABG x3 with a LIMA to the LAD, SVG to the OM, SVG to the PDA on 03/31/14. EF 60% by cath.   . GERD without esophagitis 08/04/2010  . Hepatic cirrhosis ([place]    a. Dx 01/2014 - CT a/p   . History of blood transfusion    "related to bleeding ulcers"  . History of concussion    1976--  NO RESIDUAL  . History of GI bleed    a. UGIB 07/2012;  b. 01/2014 admission with GIB/FOB stool req 1U prbc's->EGD showed portal gastropathy, barrett's esoph, and chronic active h. pylori gastritis.  .Marland KitchenHistory of gout    2007 &  2008  LEFT LEG-- NO ISSUE SINCE  . Hyperlipidemia   . Iron deficiency anemia   . Kidney stones   . OA (osteoarthritis of spine)    LOWER BACK--  INTERMITTANT LEFT LEG NUMBNESS  . OSA  (obstructive sleep apnea)    PULMOLOGIST-  DR CLANCE--  MODERATE OSA  STARTED CPAP 2012--  BUT CURRENTLY HAS NOT USED PAST 6 MONTHS  . Phimosis    a. s/p circumcision 2015.  . Type 2 diabetes mellitus ([place]   . Unspecified essential hypertension     Past Surgical History:  Procedure Laterality Date  . ANKLE FRACTURE SURGERY Right 1989   "plate put in"  . APPENDECTOMY  05-16-2004   open  . CIRCUMCISION N/A 09/09/2013   Procedure: CIRCUMCISION ADULT;  Surgeon: DBernestine Amass MD;  [facility]  Service: Urology;  Laterality: N/A;  . COLECTOMY  05-16-2004  . CORONARY ANGIOPLASTY WITH STENT PLACEMENT  06/28/2012  DR COOPER   PCI W/  X1 DES to [place] LAD/  LM  40% OSTIAL & 50-60% DISTAL /  50% PROX LCX/  30-40% PROX RCA & 50% MID RCA/   LVEF 65-70%  . CORONARY ARTERY BYPASS GRAFT N/A 03/31/2014   Procedure: CORONARY ARTERY BYPASS GRAFTING (CABG) times 3 using left internal mammary artery and right saphenous vein.;  Surgeon: SMelrose Nakayama MD;  [place]  Service: Open Heart Surgery;  Laterality: N/A;  . DOBUTAMINE STRESS ECHO  06-08-2012   MODERATE HYPOKINESIS/ ISCHEMIA MID INFERIOR WALL  . ESOPHAGOGASTRODUODENOSCOPY  08/15/2012   Procedure: ESOPHAGOGASTRODUODENOSCOPY (EGD);  Surgeon:  Wonda Horner, MD;  Location: [facility] ENDOSCOPY;  Service: Endoscopy;  Laterality: N/A;  . ESOPHAGOGASTRODUODENOSCOPY N/A 02/17/2014   Procedure: ESOPHAGOGASTRODUODENOSCOPY (EGD);  Surgeon: Jeryl Columbia, MD;  Location: [facility] ENDOSCOPY;  Service: Endoscopy;  Laterality: N/A;  . ESOPHAGOGASTRODUODENOSCOPY N/A 04/17/2020   Procedure: ESOPHAGOGASTRODUODENOSCOPY (EGD);  Surgeon: Ronnette Juniper, MD;  Location: [place];  Service: Gastroenterology;  Laterality: N/A;  . ESOPHAGOGASTRODUODENOSCOPY (EGD) WITH PROPOFOL N/A 09/30/2017   Procedure: ESOPHAGOGASTRODUODENOSCOPY (EGD) WITH PROPOFOL;  Surgeon: Wonda Horner, MD;  Location: [facility] ENDOSCOPY;  Service: Endoscopy;  Laterality: N/A;  .  ESOPHAGOGASTRODUODENOSCOPY (EGD) WITH PROPOFOL N/A 12/20/2017   Procedure: ESOPHAGOGASTRODUODENOSCOPY (EGD) WITH PROPOFOL;  Surgeon: Clarene Essex, MD;  Location: [place];  Service: Endoscopy;  Laterality: N/A;  . HERNIA REPAIR    . INTRAOPERATIVE TRANSESOPHAGEAL ECHOCARDIOGRAM N/A 03/31/2014   Procedure: INTRAOPERATIVE TRANSESOPHAGEAL ECHOCARDIOGRAM;  Surgeon: Melrose Nakayama, MD;  Location: [place];  Service: Open Heart Surgery;  Laterality: N/A;  . IR ANGIOGRAM SELECTIVE EACH ADDITIONAL VESSEL  06/16/2020  . IR ANGIOGRAM SELECTIVE EACH ADDITIONAL VESSEL  06/16/2020  . IR ANGIOGRAM SELECTIVE EACH ADDITIONAL VESSEL  06/16/2020  . IR ANGIOGRAM SELECTIVE EACH ADDITIONAL VESSEL  06/16/2020  . IR ANGIOGRAM SELECTIVE EACH ADDITIONAL VESSEL  06/16/2020  . IR ANGIOGRAM SELECTIVE EACH ADDITIONAL VESSEL  06/16/2020  . IR ANGIOGRAM SELECTIVE EACH ADDITIONAL VESSEL  06/16/2020  . IR ANGIOGRAM SELECTIVE EACH ADDITIONAL VESSEL  06/16/2020  . IR ANGIOGRAM SELECTIVE EACH ADDITIONAL VESSEL  06/16/2020  . IR ANGIOGRAM VISCERAL SELECTIVE  06/16/2020  . IR EMBO ARTERIAL NOT HEMORR HEMANG INC GUIDE ROADMAPPING  06/16/2020  . IR RADIOLOGIST EVAL & MGMT  05/06/2020  . IR RADIOLOGIST EVAL & MGMT  05/20/2020  . IR US GUIDE VASC ACCESS RIGHT  06/16/2020  . LAPAROSCOPIC UMBILICAL HERNIA REPAIR W/ MESH  06-06-2011  . LEFT HEART CATHETERIZATION WITH CORONARY ANGIOGRAM N/A 03/28/2014   Procedure: LEFT HEART CATHETERIZATION WITH CORONARY ANGIOGRAM;  Surgeon: Sinclair Grooms, MD;  Location: [facility] CATH LAB;  Service: Cardiovascular;  Laterality: N/A;  . LIPOMA EXCISION Left 08/25/2017   Procedure: EXCISION LIPOMA LEFT POSTERIOR THIGH;  Surgeon: Clovis Riley, MD;  Location: [place];  Service: General;  Laterality: Left;  . NEPHROLITHOTOMY  1990'S  . OPEN APPENDECTOMY W/ PARTIAL CECECTOMY  05-16-2004  . PERCUTANEOUS CORONARY STENT INTERVENTION (PCI-S) N/A 06/28/2012   Procedure: PERCUTANEOUS CORONARY STENT INTERVENTION  (PCI-S);  Surgeon: Sherren Mocha, MD;  Location: [facility] CATH LAB;  Service: Cardiovascular;  Laterality: N/A;    Allergies: Patient has no known allergies.  Medications: Prior to Admission medications   Medication Sig Start Date End Date Taking? Authorizing Provider  Accu-Chek Softclix Lancets lancets USE AS DIRECTED TO TEST BLOOD SUGAR FOUR TIMES DAILY 03/13/20   [name], MD  albuterol (VENTOLIN HFA) 108 (90 Base) MCG/ACT inhaler Inhale 2 puffs into the lungs every 6 (six) hours as needed for wheezing or shortness of breath. 03/12/20   [name], MD  atorvastatin (LIPITOR) 40 MG tablet TAKE 1 TABLET BY MOUTH DAILY AT 6 PM 05/13/20   [name], MD  blood glucose meter kit and supplies Dispense based on patient and insurance preference. Use up to four times daily as directed. (FOR ICD-10 E10.9, E11.9). 03/12/20   [name], MD  diazepam (VALIUM) 5 MG tablet Take 5 mg by mouth 2 (two) times daily as needed. 05/12/20   [provider]  ferrous sulfate 325 (65 FE) MG EC tablet Take 1 tablet (  325 mg total) by mouth daily with breakfast. 01/16/20   [name], MD  gabapentin (NEURONTIN) 100 MG capsule TAKE 1 CAPSULE(100 MG) BY MOUTH TWICE DAILY 05/14/19   [name], MD  metFORMIN (GLUCOPHAGE) 850 MG tablet TAKE 1 TABLET(850 MG) BY MOUTH TWICE DAILY WITH A MEAL 06/16/20   [name], MD  nitroGLYCERIN (NITROSTAT) 0.4 MG SL tablet Place 1 tablet (0.4 mg total) under the tongue every 5 (five) minutes as needed for chest pain. 06/08/16   Nahser, Wonda Cheng, MD  pantoprazole (PROTONIX) 40 MG tablet Take 1 tablet (40 mg total) by mouth daily. 04/23/20   Pokhrel, Corrie Mckusick, MD  polyethylene glycol (MIRALAX / GLYCOLAX) 17 g packet Take 17 g by mouth daily as needed for moderate constipation. 04/23/20   Pokhrel, Corrie Mckusick, MD  spironolactone (ALDACTONE) 25 MG tablet Take 1 tablet (25 mg total) by mouth daily. 01/04/20   Nita Sells,  MD  torsemide (DEMADEX) 20 MG tablet Take 40 mg (2 pills) every morning and every afternoon except Mon, Wed, Fri afternoons take only 20 mg (1 pill) 02/06/20   Nahser, Wonda Cheng, MD  umeclidinium-vilanterol (ANORO ELLIPTA) 62.5-25 MCG/INH AEPB Inhale 1 puff into the lungs daily. 03/12/20   [name], MD     Family History  Problem Relation Age of Onset  . Lung cancer Sister   . Cancer Sister        lung  . Cancer Mother   . Cancer Father        died in his 87s.  . Cancer Brother        lung  . Coronary artery disease Other   . Diabetes Other   . Colon cancer Other   . Cancer Sister        lung    Social History   Socioeconomic History  . Marital status: Married    Spouse name: Not on file  . Number of children: Not on file  . Years of education: Not on file  . Highest education level: Not on file  Occupational History  . Not on file  Tobacco Use  . Smoking status: Former Smoker    Packs/day: 2.00    Years: 40.00    Pack years: 80.00    Types: Cigarettes    Quit date: 08/29/1996    Years since quitting: 23.8  . Smokeless tobacco: Never Used  Vaping Use  . Vaping Use: Never used  Substance and Sexual Activity  . Alcohol use: Yes    Alcohol/week: 8.0 standard drinks    Types: 8 Cans of beer per week    Comment: 2 beers every other day  . Drug use: No  . Sexual activity: Yes  Other Topics Concern  . Not on file  Social History Narrative   Lives in [place] with wife, son, and son's family.   Social Determinants of Health   Financial Resource Strain:   . Difficulty of Paying Living Expenses: Not on file  Food Insecurity:   . Worried About Charity fundraiser in the Last Year: Not on file  . Ran Out of Food in the Last Year: Not on file  Transportation Needs:   . Lack of Transportation (Medical): Not on file  . Lack of Transportation (Non-Medical): Not on file  Physical Activity:   . Days of Exercise per Week: Not on file  . Minutes of Exercise per  Session: Not on file  Stress:   . Feeling of Stress :  Not on file  Social Connections:   . Frequency of Communication with Friends and Family: Not on file  . Frequency of Social Gatherings with Friends and Family: Not on file  . Attends Religious Services: Not on file  . Active Member of Clubs or Organizations: Not on file  . Attends Archivist Meetings: Not on file  . Marital Status: Not on file    Review of Systems: A 12 point ROS discussed and pertinent positives are indicated in the HPI above.  All other systems are negative.  Review of Systems  Constitutional: Positive for fatigue. Negative for fever.  HENT: Negative for congestion.   Respiratory: Negative for cough and shortness of breath.   Cardiovascular: Negative for chest pain.  Gastrointestinal: Negative for abdominal pain.  Neurological: Negative for headaches.  Psychiatric/Behavioral: Negative for behavioral problems and confusion.    Vital Signs: There were no vitals taken for this visit.  Physical Exam Vitals and nursing note reviewed.  Constitutional:      Appearance: He is well-developed.  HENT:     Head: Normocephalic.  Cardiovascular:     Rate and Rhythm: Normal rate and regular rhythm.     Heart sounds: Normal heart sounds.  Pulmonary:     Effort: Pulmonary effort is normal.     Breath sounds: Normal breath sounds.  Musculoskeletal:        General: Normal range of motion.     Cervical back: Normal range of motion.  Skin:    General: Skin is dry.  Neurological:     Mental Status: He is alert and oriented to person, place, and time.     Imaging: NM LIVER TUMOR LOC IMFLAM SPECT 1 DAY  Result Date: 06/16/2020 CLINICAL DATA:  Hepatocellular carcinoma. Solitary RIGHT hepatic lobe lesion. Pre yttrium 90 radio embolization evaluation. EXAM: NUCLEAR MEDICINE LIVER SCAN; ULTRASOUND MISCELLANEOUS SOFT TISSUE TECHNIQUE: Abdominal images were obtained in multiple projections after intrahepatic  arterial injection of radiopharmaceutical. SPECT imaging was performed. Lung shunt calculation was performed. RADIOPHARMACEUTICALS:  4.4 millicuries technetium MAA COMPARISON:  MRI 05/13/2020 FINDINGS: The injected microaggregated albumin localizes within the RIGHT hepatic lobe. Focal uptake noted within the RIGHT hepatic lobe solitary lesion. No evidence of activity within the stomach, duodenum, or bowel. Calculated shunt fraction to the lungs equals 6.3%. IMPRESSION: 1. No significant extrahepatic radiotracer activity following intrahepatic arterial injection of MAA. 2. Localization to the RIGHT hepatic lobe with concentration in solitary lesion. 3. Lung shunt fraction equals 6.3% Electronically Signed   By: Suzy Bouchard M.D.   On: 06/16/2020 15:09   IR Angiogram Visceral Selective  Result Date: 06/16/2020 INDICATION: 74 year old male with right liver HCC presents for Y 90 mapping EXAM: ULTRASOUND-GUIDED ACCESS RIGHT COMMON FEMORAL ARTERY MESENTERIC ANGIOGRAM COIL EMBOLIZATION OF RIGHT GASTRIC ARTERY DELIVERY OF MAA EXOSEAL FOR HEMOSTASIS MEDICATIONS: None ANESTHESIA/SEDATION: Moderate (conscious) sedation was employed during this procedure. A total of Versed 3.0 mg and Fentanyl 100 mcg was administered intravenously. Moderate Sedation Time: 76 minutes minutes. The patient's level of consciousness and vital signs were monitored continuously by radiology nursing throughout the procedure under my direct supervision. CONTRAST:  77m OMNIPAQUE IOHEXOL 300 MG/ML SOLN, 68mOMNIPAQUE IOHEXOL 300 MG/ML SOLN, 10074mMNIPAQUE IOHEXOL 300 MG/ML SOLN FLUOROSCOPY TIME:  Fluoroscopy Time: 17 minutes 6 seconds (3,779 mGy). COMPLICATIONS: None PROCEDURE: Informed consent was obtained from the patient following explanation of the procedure, risks, benefits and alternatives. The patient understands, agrees and consents for the procedure. All questions were addressed. A time  out was performed prior to the initiation of the  procedure. Maximal barrier sterile technique utilized including caps, mask, sterile gowns, sterile gloves, large sterile drape, hand hygiene, and Betadine prep. Ultrasound survey of the right inguinal region was performed with images stored and sent to PACs, confirming patency of the vessel. A micropuncture needle was used access the right common femoral artery under ultrasound. With excellent arterial blood flow returned, and an .018 micro wire was passed through the needle, observed enter the abdominal aorta under fluoroscopy. The needle was removed, and a micropuncture sheath was placed over the wire. The inner dilator and wire were removed, and an 035 Bentson wire was advanced under fluoroscopy into the abdominal aorta. The sheath was removed and a standard 5 [place] vascular sheath was placed. The dilator was removed and the sheath was flushed. C2 cobra catheter was advanced on the Bentson wire into the abdominal aorta. Catheter was used to select the celiac artery. Angiogram was performed. Glidewire was used to navigate the C2 catheter into the common hepatic artery. Angiogram was performed. 135 cm high-flow Renegade microcatheter was then used with an 014 fathom wire to select the branches of interest of the right hepatic artery. The initial catheter course was into the right gastric artery. Wire was removed and angiogram was performed to confirm location. Coil embolization was then performed with a low-profile Ruby micro coil, 2 mm x 4 mm. Once this was deployed catheter was withdrawn to the left hepatic artery. Angiogram was performed. Sub selection of a segment 2 and segment 4 common branch was performed with angiogram. Catheter was then advanced on the microwire into the gastroduodenal artery. Angiogram was performed. Microcatheter was then used to select the right hepatic artery. Angiogram was performed. We then isolated with sequential catheter placement the anterior and posterior division of the right  hepatic artery, with angiogram performed in both branches of the division. This was done to isolate the artery of interest to the right-sided HCC at the border of segments 7 and 8. The majority of flow appears to be from segment 8 branch. Ultimately we headache successful sub selection of the segment 8 branch, confirming robust arterial flow to the tumor from the segment 8 branch. The MAA was delivered after diluting into 6 cc of saline. All catheters wires were removed. Exoseal was deployed for hemostasis. Patient tolerated the procedure well and remained hemodynamically stable throughout. No complications were encountered and no significant blood loss FINDINGS: Ultrasound demonstrates patent common femoral artery with moderate calcified plaque. Angiogram of the celiac artery confirms typical configuration with a fairly horizontal course of the celiac artery and the right common hepatic artery. Angiogram of the common hepatic artery demonstrates no high-grade stenosis with good caliber. There is a essential trifurcation of the right hepatic artery left hepatic artery and the gastroduodenal artery. There is a very proximal division of the left hepatic artery to the segment 4/left branch and the segment 3 branch. Angiogram of the right gastric artery and the left hepatic artery demonstrates a very proximal origin of the right gastric from the left hepatic artery. Right gastric artery was coil embolized with a single coil. Angiogram of the left hepatic artery demonstrates a very proximal division, with what appears to be a fairly unique arrangement of the segment 2 branch and segment 4 branch, with a separate segment 3 artery. There did not appear to be any parasitized flow to the right side from the segment 4 branch or segment 2 branch. Gastroduodenal  artery injection demonstrates some proximal supra duodenal branches, which do not appear to be in a dangerous location for the future planned treatment. GDA was not  embolized given our future plan and the location of the tumor. Angiogram of the right hepatic artery demonstrates patent cystic artery and patent caudate arteries. The division of the anterior and posterior branches is in a fairly vertical segment with the majority of flow to the tumor from the segment 8 branch. Angiogram of the posterior division demonstrates no significant flow to the tumor on the border of 7 and 8. Robust arterial perfusion/flow was isolated into the single segment 8 branch. This was the branch that the MAA was delivered. Our future plan would be a more distal position within the segment 8 artery for better isolation to the tumor and decreased collateral normal liver perfusion. This should be reasonable with a more distal base catheter position as well as a smaller microcatheter. IMPRESSION: Status post ultrasound guided access right common femoral artery for mesenteric angiogram Y 90 mapping, coil embolization of the right gastric artery, and delivery of MAA into right hepatic artery segment 8. ExoSeal for hemostasis. Signed, Dulcy Fanny. Dellia Nims, RPVI [facility] Radiology Electronically Signed   By: Corrie Mckusick D.O.   On: 06/16/2020 13:18   IR Angiogram Selective Each Additional Vessel  Result Date: 06/16/2020 INDICATION: 74 year old male with right liver HCC presents for Y 90 mapping EXAM: ULTRASOUND-GUIDED ACCESS RIGHT COMMON FEMORAL ARTERY MESENTERIC ANGIOGRAM COIL EMBOLIZATION OF RIGHT GASTRIC ARTERY DELIVERY OF MAA EXOSEAL FOR HEMOSTASIS MEDICATIONS: None ANESTHESIA/SEDATION: Moderate (conscious) sedation was employed during this procedure. A total of Versed 3.0 mg and Fentanyl 100 mcg was administered intravenously. Moderate Sedation Time: 76 minutes minutes. The patient's level of consciousness and vital signs were monitored continuously by radiology nursing throughout the procedure under my direct supervision. CONTRAST:  85m  OMNIPAQUE IOHEXOL 300 MG/ML SOLN, 621mOMNIPAQUE IOHEXOL 300 MG/ML SOLN, 10085mMNIPAQUE IOHEXOL 300 MG/ML SOLN FLUOROSCOPY TIME:  Fluoroscopy Time: 17 minutes 6 seconds (3,779 mGy). COMPLICATIONS: None PROCEDURE: Informed consent was obtained from the patient following explanation of the procedure, risks, benefits and alternatives. The patient understands, agrees and consents for the procedure. All questions were addressed. A time out was performed prior to the initiation of the procedure. Maximal barrier sterile technique utilized including caps, mask, sterile gowns, sterile gloves, large sterile drape, hand hygiene, and Betadine prep. Ultrasound survey of the right inguinal region was performed with images stored and sent to PACs, confirming patency of the vessel. A micropuncture needle was used access the right common femoral artery under ultrasound. With excellent arterial blood flow returned, and an .018 micro wire was passed through the needle, observed enter the abdominal aorta under fluoroscopy. The needle was removed, and a micropuncture sheath was placed over the wire. The inner dilator and wire were removed, and an 035 Bentson wire was advanced under fluoroscopy into the abdominal aorta. The sheath was removed and a standard 5 Fre[place]istanscular sheath was placed. The dilator was removed and the sheath was flushed. C2 cobra catheter was advanced on the Bentson wire into the abdominal aorta. Catheter was used to select the celiac artery. Angiogram was performed. Glidewire was used to navigate the C2 catheter into the common hepatic artery. Angiogram was performed. 135 cm high-flow Renegade microcatheter was then used with an 014 fathom wire to select the branches of interest of the right hepatic artery. The initial catheter course was into the right  gastric artery. Wire was removed and angiogram was performed to confirm location. Coil embolization was then performed with a low-profile Ruby micro coil, 2 mm x  4 mm. Once this was deployed catheter was withdrawn to the left hepatic artery. Angiogram was performed. Sub selection of a segment 2 and segment 4 common branch was performed with angiogram. Catheter was then advanced on the microwire into the gastroduodenal artery. Angiogram was performed. Microcatheter was then used to select the right hepatic artery. Angiogram was performed. We then isolated with sequential catheter placement the anterior and posterior division of the right hepatic artery, with angiogram performed in both branches of the division. This was done to isolate the artery of interest to the right-sided HCC at the border of segments 7 and 8. The majority of flow appears to be from segment 8 branch. Ultimately we headache successful sub selection of the segment 8 branch, confirming robust arterial flow to the tumor from the segment 8 branch. The MAA was delivered after diluting into 6 cc of saline. All catheters wires were removed. Exoseal was deployed for hemostasis. Patient tolerated the procedure well and remained hemodynamically stable throughout. No complications were encountered and no significant blood loss FINDINGS: Ultrasound demonstrates patent common femoral artery with moderate calcified plaque. Angiogram of the celiac artery confirms typical configuration with a fairly horizontal course of the celiac artery and the right common hepatic artery. Angiogram of the common hepatic artery demonstrates no high-grade stenosis with good caliber. There is a essential trifurcation of the right hepatic artery left hepatic artery and the gastroduodenal artery. There is a very proximal division of the left hepatic artery to the segment 4/left branch and the segment 3 branch. Angiogram of the right gastric artery and the left hepatic artery demonstrates a very proximal origin of the right gastric from the left hepatic artery. Right gastric artery was coil embolized with a single coil. Angiogram of the left  hepatic artery demonstrates a very proximal division, with what appears to be a fairly unique arrangement of the segment 2 branch and segment 4 branch, with a separate segment 3 artery. There did not appear to be any parasitized flow to the right side from the segment 4 branch or segment 2 branch. Gastroduodenal artery injection demonstrates some proximal supra duodenal branches, which do not appear to be in a dangerous location for the future planned treatment. GDA was not embolized given our future plan and the location of the tumor. Angiogram of the right hepatic artery demonstrates patent cystic artery and patent caudate arteries. The division of the anterior and posterior branches is in a fairly vertical segment with the majority of flow to the tumor from the segment 8 branch. Angiogram of the posterior division demonstrates no significant flow to the tumor on the border of 7 and 8. Robust arterial perfusion/flow was isolated into the single segment 8 branch. This was the branch that the MAA was delivered. Our future plan would be a more distal position within the segment 8 artery for better isolation to the tumor and decreased collateral normal liver perfusion. This should be reasonable with a more distal base catheter position as well as a smaller microcatheter. IMPRESSION: Status post ultrasound guided access right common femoral artery for mesenteric angiogram Y 90 mapping, coil embolization of the right gastric artery, and delivery of MAA into right hepatic artery segment 8. ExoSeal for hemostasis. Signed, Dulcy Fanny. Dellia Nims, RPVI [facility] Radiology Electronically Signed   By:  Corrie Mckusick D.O.   On: 06/16/2020 13:18   IR Angiogram Selective Each Additional Vessel  Result Date: 06/16/2020 INDICATION: 74 year old male with right liver HCC presents for Y 90 mapping EXAM: ULTRASOUND-GUIDED ACCESS RIGHT COMMON FEMORAL ARTERY MESENTERIC ANGIOGRAM COIL  EMBOLIZATION OF RIGHT GASTRIC ARTERY DELIVERY OF MAA EXOSEAL FOR HEMOSTASIS MEDICATIONS: None ANESTHESIA/SEDATION: Moderate (conscious) sedation was employed during this procedure. A total of Versed 3.0 mg and Fentanyl 100 mcg was administered intravenously. Moderate Sedation Time: 76 minutes minutes. The patient's level of consciousness and vital signs were monitored continuously by radiology nursing throughout the procedure under my direct supervision. CONTRAST:  27m OMNIPAQUE IOHEXOL 300 MG/ML SOLN, 665mOMNIPAQUE IOHEXOL 300 MG/ML SOLN, 10049mMNIPAQUE IOHEXOL 300 MG/ML SOLN FLUOROSCOPY TIME:  Fluoroscopy Time: 17 minutes 6 seconds (3,779 mGy). COMPLICATIONS: None PROCEDURE: Informed consent was obtained from the patient following explanation of the procedure, risks, benefits and alternatives. The patient understands, agrees and consents for the procedure. All questions were addressed. A time out was performed prior to the initiation of the procedure. Maximal barrier sterile technique utilized including caps, mask, sterile gowns, sterile gloves, large sterile drape, hand hygiene, and Betadine prep. Ultrasound survey of the right inguinal region was performed with images stored and sent to PACs, confirming patency of the vessel. A micropuncture needle was used access the right common femoral artery under ultrasound. With excellent arterial blood flow returned, and an .018 micro wire was passed through the needle, observed enter the abdominal aorta under fluoroscopy. The needle was removed, and a micropuncture sheath was placed over the wire. The inner dilator and wire were removed, and an 035 Bentson wire was advanced under fluoroscopy into the abdominal aorta. The sheath was removed and a standard 5 FrePakistanscular sheath was placed. The dilator was removed and the sheath was flushed. C2 cobra catheter was advanced on the Bentson wire into the abdominal aorta. Catheter was used to select the celiac artery.  Angiogram was performed. Glidewire was used to navigate the C2 catheter into the common hepatic artery. Angiogram was performed. 135 cm high-flow Renegade microcatheter was then used with an 014 fathom wire to select the branches of interest of the right hepatic artery. The initial catheter course was into the right gastric artery. Wire was removed and angiogram was performed to confirm location. Coil embolization was then performed with a low-profile Ruby micro coil, 2 mm x 4 mm. Once this was deployed catheter was withdrawn to the left hepatic artery. Angiogram was performed. Sub selection of a segment 2 and segment 4 common branch was performed with angiogram. Catheter was then advanced on the microwire into the gastroduodenal artery. Angiogram was performed. Microcatheter was then used to select the right hepatic artery. Angiogram was performed. We then isolated with sequential catheter placement the anterior and posterior division of the right hepatic artery, with angiogram performed in both branches of the division. This was done to isolate the artery of interest to the right-sided HCC at the border of segments 7 and 8. The majority of flow appears to be from segment 8 branch. Ultimately we headache successful sub selection of the segment 8 branch, confirming robust arterial flow to the tumor from the segment 8 branch. The MAA was delivered after diluting into 6 cc of saline. All catheters wires were removed. Exoseal was deployed for hemostasis. Patient tolerated the procedure well and remained hemodynamically stable throughout. No complications were encountered and no significant blood loss FINDINGS: Ultrasound demonstrates patent common femoral artery with  moderate calcified plaque. Angiogram of the celiac artery confirms typical configuration with a fairly horizontal course of the celiac artery and the right common hepatic artery. Angiogram of the common hepatic artery demonstrates no high-grade stenosis  with good caliber. There is a essential trifurcation of the right hepatic artery left hepatic artery and the gastroduodenal artery. There is a very proximal division of the left hepatic artery to the segment 4/left branch and the segment 3 branch. Angiogram of the right gastric artery and the left hepatic artery demonstrates a very proximal origin of the right gastric from the left hepatic artery. Right gastric artery was coil embolized with a single coil. Angiogram of the left hepatic artery demonstrates a very proximal division, with what appears to be a fairly unique arrangement of the segment 2 branch and segment 4 branch, with a separate segment 3 artery. There did not appear to be any parasitized flow to the right side from the segment 4 branch or segment 2 branch. Gastroduodenal artery injection demonstrates some proximal supra duodenal branches, which do not appear to be in a dangerous location for the future planned treatment. GDA was not embolized given our future plan and the location of the tumor. Angiogram of the right hepatic artery demonstrates patent cystic artery and patent caudate arteries. The division of the anterior and posterior branches is in a fairly vertical segment with the majority of flow to the tumor from the segment 8 branch. Angiogram of the posterior division demonstrates no significant flow to the tumor on the border of 7 and 8. Robust arterial perfusion/flow was isolated into the single segment 8 branch. This was the branch that the MAA was delivered. Our future plan would be a more distal position within the segment 8 artery for better isolation to the tumor and decreased collateral normal liver perfusion. This should be reasonable with a more distal base catheter position as well as a smaller microcatheter. IMPRESSION: Status post ultrasound guided access right common femoral artery for mesenteric angiogram Y 90 mapping, coil embolization of the right gastric artery, and delivery  of MAA into right hepatic artery segment 8. ExoSeal for hemostasis. Signed, Dulcy Fanny. Dellia Nims, RPVI [facility] Radiology Special[facility] Radiology Electronically Signed   By: Corrie Mckusick D.O.   On: 06/16/2020 13:18   IR Angiogram Selective Each Additional Vessel  Result Date: 06/16/2020 INDICATION: 74 year old male with right liver HCC presents for Y 90 mapping EXAM: ULTRASOUND-GUIDED ACCESS RIGHT COMMON FEMORAL ARTERY MESENTERIC ANGIOGRAM COIL EMBOLIZATION OF RIGHT GASTRIC ARTERY DELIVERY OF MAA EXOSEAL FOR HEMOSTASIS MEDICATIONS: None ANESTHESIA/SEDATION: Moderate (conscious) sedation was employed during this procedure. A total of Versed 3.0 mg and Fentanyl 100 mcg was administered intravenously. Moderate Sedation Time: 76 minutes minutes. The patient's level of consciousness and vital signs were monitored continuously by radiology nursing throughout the procedure under my direct supervision. CONTRAST:  57m OMNIPAQUE IOHEXOL 300 MG/ML SOLN, 651mOMNIPAQUE IOHEXOL 300 MG/ML SOLN, 10053mMNIPAQUE IOHEXOL 300 MG/ML SOLN FLUOROSCOPY TIME:  Fluoroscopy Time: 17 minutes 6 seconds (3,779 mGy). COMPLICATIONS: None PROCEDURE: Informed consent was obtained from the patient following explanation of the procedure, risks, benefits and alternatives. The patient understands, agrees and consents for the procedure. All questions were addressed. A time out was performed prior to the initiation of the procedure. Maximal barrier sterile technique utilized including caps, mask, sterile gowns, sterile gloves, large sterile drape, hand hygiene, and Betadine prep. Ultrasound survey of the right inguinal region was performed with images stored and sent  to PACs, confirming patency of the vessel. A micropuncture needle was used access the right common femoral artery under ultrasound. With excellent arterial blood flow returned, and an .018 micro wire was passed through the needle, observed enter the  abdominal aorta under fluoroscopy. The needle was removed, and a micropuncture sheath was placed over the wire. The inner dilator and wire were removed, and an 035 Bentson wire was advanced under fluoroscopy into the abdominal aorta. The sheath was removed and a standard 5 [place] vascular sheath was placed. The dilator was removed and the sheath was flushed. C2 cobra catheter was advanced on the Bentson wire into the abdominal aorta. Catheter was used to select the celiac artery. Angiogram was performed. Glidewire was used to navigate the C2 catheter into the common hepatic artery. Angiogram was performed. 135 cm high-flow Renegade microcatheter was then used with an 014 fathom wire to select the branches of interest of the right hepatic artery. The initial catheter course was into the right gastric artery. Wire was removed and angiogram was performed to confirm location. Coil embolization was then performed with a low-profile Ruby micro coil, 2 mm x 4 mm. Once this was deployed catheter was withdrawn to the left hepatic artery. Angiogram was performed. Sub selection of a segment 2 and segment 4 common branch was performed with angiogram. Catheter was then advanced on the microwire into the gastroduodenal artery. Angiogram was performed. Microcatheter was then used to select the right hepatic artery. Angiogram was performed. We then isolated with sequential catheter placement the anterior and posterior division of the right hepatic artery, with angiogram performed in both branches of the division. This was done to isolate the artery of interest to the right-sided HCC at the border of segments 7 and 8. The majority of flow appears to be from segment 8 branch. Ultimately we headache successful sub selection of the segment 8 branch, confirming robust arterial flow to the tumor from the segment 8 branch. The MAA was delivered after diluting into 6 cc of saline. All catheters wires were removed. Exoseal was deployed for  hemostasis. Patient tolerated the procedure well and remained hemodynamically stable throughout. No complications were encountered and no significant blood loss FINDINGS: Ultrasound demonstrates patent common femoral artery with moderate calcified plaque. Angiogram of the celiac artery confirms typical configuration with a fairly horizontal course of the celiac artery and the right common hepatic artery. Angiogram of the common hepatic artery demonstrates no high-grade stenosis with good caliber. There is a essential trifurcation of the right hepatic artery left hepatic artery and the gastroduodenal artery. There is a very proximal division of the left hepatic artery to the segment 4/left branch and the segment 3 branch. Angiogram of the right gastric artery and the left hepatic artery demonstrates a very proximal origin of the right gastric from the left hepatic artery. Right gastric artery was coil embolized with a single coil. Angiogram of the left hepatic artery demonstrates a very proximal division, with what appears to be a fairly unique arrangement of the segment 2 branch and segment 4 branch, with a separate segment 3 artery. There did not appear to be any parasitized flow to the right side from the segment 4 branch or segment 2 branch. Gastroduodenal artery injection demonstrates some proximal supra duodenal branches, which do not appear to be in a dangerous location for the future planned treatment. GDA was not embolized given our future plan and the location of the tumor. Angiogram of the right hepatic artery demonstrates  patent cystic artery and patent caudate arteries. The division of the anterior and posterior branches is in a fairly vertical segment with the majority of flow to the tumor from the segment 8 branch. Angiogram of the posterior division demonstrates no significant flow to the tumor on the border of 7 and 8. Robust arterial perfusion/flow was isolated into the single segment 8 branch. This  was the branch that the MAA was delivered. Our future plan would be a more distal position within the segment 8 artery for better isolation to the tumor and decreased collateral normal liver perfusion. This should be reasonable with a more distal base catheter position as well as a smaller microcatheter. IMPRESSION: Status post ultrasound guided access right common femoral artery for mesenteric angiogram Y 90 mapping, coil embolization of the right gastric artery, and delivery of MAA into right hepatic artery segment 8. ExoSeal for hemostasis. Signed, Dulcy Fanny. Dellia Nims, RPVI [facility] Inter[facility] Radiology Specialists [facility] Radiology Electronically Signed   By: Corrie Mckusick D.O.   On: 06/16/2020 13:18   IR Angiogram Selective Each Additional Vessel  Result Date: 06/16/2020 INDICATION: 74 year old male with right liver HCC presents for Y 90 mapping EXAM: ULTRASOUND-GUIDED ACCESS RIGHT COMMON FEMORAL ARTERY MESENTERIC ANGIOGRAM COIL EMBOLIZATION OF RIGHT GASTRIC ARTERY DELIVERY OF MAA EXOSEAL FOR HEMOSTASIS MEDICATIONS: None ANESTHESIA/SEDATION: Moderate (conscious) sedation was employed during this procedure. A total of Versed 3.0 mg and Fentanyl 100 mcg was administered intravenously. Moderate Sedation Time: 76 minutes minutes. The patient's level of consciousness and vital signs were monitored continuously by radiology nursing throughout the procedure under my direct supervision. CONTRAST:  25m OMNIPAQUE IOHEXOL 300 MG/ML SOLN, 620mOMNIPAQUE IOHEXOL 300 MG/ML SOLN, 10056mMNIPAQUE IOHEXOL 300 MG/ML SOLN FLUOROSCOPY TIME:  Fluoroscopy Time: 17 minutes 6 seconds (3,779 mGy). COMPLICATIONS: None PROCEDURE: Informed consent was obtained from the patient following explanation of the procedure, risks, benefits and alternatives. The patient understands, agrees and consents for the procedure. All questions were addressed. A time out was performed prior to the initiation of the procedure. Maximal barrier  sterile technique utilized including caps, mask, sterile gowns, sterile gloves, large sterile drape, hand hygiene, and Betadine prep. Ultrasound survey of the right inguinal region was performed with images stored and sent to PACs, confirming patency of the vessel. A micropuncture needle was used access the right common femoral artery under ultrasound. With excellent arterial blood flow returned, and an .018 micro wire was passed through the needle, observed enter the abdominal aorta under fluoroscopy. The needle was removed, and a micropuncture sheath was placed over the wire. The inner dilator and wire were removed, and an 035 Bentson wire was advanced under fluoroscopy into the abdominal aorta. The sheath was removed and a standard 5 FrePakistanscular sheath was placed. The dilator was removed and the sheath was flushed. C2 cobra catheter was advanced on the Bentson wire into the abdominal aorta. Catheter was used to select the celiac artery. Angiogram was performed. Glidewire was used to navigate the C2 catheter into the common hepatic artery. Angiogram was performed. 135 cm high-flow Renegade microcatheter was then used with an 014 fathom wire to select the branches of interest of the right hepatic artery. The initial catheter course was into the right gastric artery. Wire was removed and angiogram was performed to confirm location. Coil embolization was then performed with a low-profile Ruby micro coil, 2 mm x 4 mm. Once this was deployed catheter was withdrawn to the left hepatic artery. Angiogram was performed. Sub  selection of a segment 2 and segment 4 common branch was performed with angiogram. Catheter was then advanced on the microwire into the gastroduodenal artery. Angiogram was performed. Microcatheter was then used to select the right hepatic artery. Angiogram was performed. We then isolated with sequential catheter placement the anterior and posterior division of the right hepatic artery, with  angiogram performed in both branches of the division. This was done to isolate the artery of interest to the right-sided HCC at the border of segments 7 and 8. The majority of flow appears to be from segment 8 branch. Ultimately we headache successful sub selection of the segment 8 branch, confirming robust arterial flow to the tumor from the segment 8 branch. The MAA was delivered after diluting into 6 cc of saline. All catheters wires were removed. Exoseal was deployed for hemostasis. Patient tolerated the procedure well and remained hemodynamically stable throughout. No complications were encountered and no significant blood loss FINDINGS: Ultrasound demonstrates patent common femoral artery with moderate calcified plaque. Angiogram of the celiac artery confirms typical configuration with a fairly horizontal course of the celiac artery and the right common hepatic artery. Angiogram of the common hepatic artery demonstrates no high-grade stenosis with good caliber. There is a essential trifurcation of the right hepatic artery left hepatic artery and the gastroduodenal artery. There is a very proximal division of the left hepatic artery to the segment 4/left branch and the segment 3 branch. Angiogram of the right gastric artery and the left hepatic artery demonstrates a very proximal origin of the right gastric from the left hepatic artery. Right gastric artery was coil embolized with a single coil. Angiogram of the left hepatic artery demonstrates a very proximal division, with what appears to be a fairly unique arrangement of the segment 2 branch and segment 4 branch, with a separate segment 3 artery. There did not appear to be any parasitized flow to the right side from the segment 4 branch or segment 2 branch. Gastroduodenal artery injection demonstrates some proximal supra duodenal branches, which do not appear to be in a dangerous location for the future planned treatment. GDA was not embolized given our  future plan and the location of the tumor. Angiogram of the right hepatic artery demonstrates patent cystic artery and patent caudate arteries. The division of the anterior and posterior branches is in a fairly vertical segment with the majority of flow to the tumor from the segment 8 branch. Angiogram of the posterior division demonstrates no significant flow to the tumor on the border of 7 and 8. Robust arterial perfusion/flow was isolated into the single segment 8 branch. This was the branch that the MAA was delivered. Our future plan would be a more distal position within the segment 8 artery for better isolation to the tumor and decreased collateral normal liver perfusion. This should be reasonable with a more distal base catheter position as well as a smaller microcatheter. IMPRESSION: Status post ultrasound guided access right common femoral artery for mesenteric angiogram Y 90 mapping, coil embolization of the right gastric artery, and delivery of MAA into right hepatic artery segment 8. ExoSeal for hemostasis. Signed, Dulcy Fanny. Dellia Nims, RPVI V[facility] and Interventional Radiology Specialists [facility] Radiology Electronically Signed   By: Corrie Mckusick D.O.   On: 06/16/2020 13:18   IR Angiogram Selective Each Additional Vessel  Result Date: 06/16/2020 INDICATION: 74 year old male with right liver HCC presents for Y 90 mapping EXAM: ULTRASOUND-GUIDED ACCESS RIGHT COMMON FEMORAL ARTERY MESENTERIC ANGIOGRAM COIL EMBOLIZATION  OF RIGHT GASTRIC ARTERY DELIVERY OF MAA EXOSEAL FOR HEMOSTASIS MEDICATIONS: None ANESTHESIA/SEDATION: Moderate (conscious) sedation was employed during this procedure. A total of Versed 3.0 mg and Fentanyl 100 mcg was administered intravenously. Moderate Sedation Time: 76 minutes minutes. The patient's level of consciousness and vital signs were monitored continuously by radiology nursing throughout the procedure under my direct supervision. CONTRAST:  37m OMNIPAQUE IOHEXOL 300  MG/ML SOLN, 670mOMNIPAQUE IOHEXOL 300 MG/ML SOLN, 10072mMNIPAQUE IOHEXOL 300 MG/ML SOLN FLUOROSCOPY TIME:  Fluoroscopy Time: 17 minutes 6 seconds (3,779 mGy). COMPLICATIONS: None PROCEDURE: Informed consent was obtained from the patient following explanation of the procedure, risks, benefits and alternatives. The patient understands, agrees and consents for the procedure. All questions were addressed. A time out was performed prior to the initiation of the procedure. Maximal barrier sterile technique utilized including caps, mask, sterile gowns, sterile gloves, large sterile drape, hand hygiene, and Betadine prep. Ultrasound survey of the right inguinal region was performed with images stored and sent to PACs, confirming patency of the vessel. A micropuncture needle was used access the right common femoral artery under ultrasound. With excellent arterial blood flow returned, and an .018 micro wire was passed through the needle, observed enter the abdominal aorta under fluoroscopy. The needle was removed, and a micropuncture sheath was placed over the wire. The inner dilator and wire were removed, and an 035 Bentson wire was advanced under fluoroscopy into the abdominal aorta. The sheath was removed and a standard 5 FrePakistanscular sheath was placed. The dilator was removed and the sheath was flushed. C2 cobra catheter was advanced on the Bentson wire into the abdominal aorta. Catheter was used to select the celiac artery. Angiogram was performed. Glidewire was used to navigate the C2 catheter into the common hepatic artery. Angiogram was performed. 135 cm high-flow Renegade microcatheter was then used with an 014 fathom wire to select the branches of interest of the right hepatic artery. The initial catheter course was into the right gastric artery. Wire was removed and angiogram was performed to confirm location. Coil embolization was then performed with a low-profile Ruby micro coil, 2 mm x 4 mm. Once this was  deployed catheter was withdrawn to the left hepatic artery. Angiogram was performed. Sub selection of a segment 2 and segment 4 common branch was performed with angiogram. Catheter was then advanced on the microwire into the gastroduodenal artery. Angiogram was performed. Microcatheter was then used to select the right hepatic artery. Angiogram was performed. We then isolated with sequential catheter placement the anterior and posterior division of the right hepatic artery, with angiogram performed in both branches of the division. This was done to isolate the artery of interest to the right-sided HCC at the border of segments 7 and 8. The majority of flow appears to be from segment 8 branch. Ultimately we headache successful sub selection of the segment 8 branch, confirming robust arterial flow to the tumor from the segment 8 branch. The MAA was delivered after diluting into 6 cc of saline. All catheters wires were removed. Exoseal was deployed for hemostasis. Patient tolerated the procedure well and remained hemodynamically stable throughout. No complications were encountered and no significant blood loss FINDINGS: Ultrasound demonstrates patent common femoral artery with moderate calcified plaque. Angiogram of the celiac artery confirms typical configuration with a fairly horizontal course of the celiac artery and the right common hepatic artery. Angiogram of the common hepatic artery demonstrates no high-grade stenosis with good caliber. There is a essential trifurcation  of the right hepatic artery left hepatic artery and the gastroduodenal artery. There is a very proximal division of the left hepatic artery to the segment 4/left branch and the segment 3 branch. Angiogram of the right gastric artery and the left hepatic artery demonstrates a very proximal origin of the right gastric from the left hepatic artery. Right gastric artery was coil embolized with a single coil. Angiogram of the left hepatic artery  demonstrates a very proximal division, with what appears to be a fairly unique arrangement of the segment 2 branch and segment 4 branch, with a separate segment 3 artery. There did not appear to be any parasitized flow to the right side from the segment 4 branch or segment 2 branch. Gastroduodenal artery injection demonstrates some proximal supra duodenal branches, which do not appear to be in a dangerous location for the future planned treatment. GDA was not embolized given our future plan and the location of the tumor. Angiogram of the right hepatic artery demonstrates patent cystic artery and patent caudate arteries. The division of the anterior and posterior branches is in a fairly vertical segment with the majority of flow to the tumor from the segment 8 branch. Angiogram of the posterior division demonstrates no significant flow to the tumor on the border of 7 and 8. Robust arterial perfusion/flow was isolated into the single segment 8 branch. This was the branch that the MAA was delivered. Our future plan would be a more distal position within the segment 8 artery for better isolation to the tumor and decreased collateral normal liver perfusion. This should be reasonable with a more distal base catheter position as well as a smaller microcatheter. IMPRESSION: Status post ultrasound guided access right common femoral artery for mesenteric angiogram Y 90 mapping, coil embolization of the right gastric artery, and delivery of MAA into right hepatic artery segment 8. ExoSeal for hemostasis. Signed, Dulcy Fanny. Dellia Nims, RPVI [facility] Radiology Electronically Signed   By: Corrie Mckusick D.O.   On: 06/16/2020 13:18   IR Angiogram Selective Each Additional Vessel  Result Date: 06/16/2020 INDICATION: 74 year old male with right liver HCC presents for Y 90 mapping EXAM: ULTRASOUND-GUIDED ACCESS RIGHT COMMON FEMORAL ARTERY MESENTERIC ANGIOGRAM COIL EMBOLIZATION OF  RIGHT GASTRIC ARTERY DELIVERY OF MAA EXOSEAL FOR HEMOSTASIS MEDICATIONS: None ANESTHESIA/SEDATION: Moderate (conscious) sedation was employed during this procedure. A total of Versed 3.0 mg and Fentanyl 100 mcg was administered intravenously. Moderate Sedation Time: 76 minutes minutes. The patient's level of consciousness and vital signs were monitored continuously by radiology nursing throughout the procedure under my direct supervision. CONTRAST:  4m OMNIPAQUE IOHEXOL 300 MG/ML SOLN, 692mOMNIPAQUE IOHEXOL 300 MG/ML SOLN, 10055mMNIPAQUE IOHEXOL 300 MG/ML SOLN FLUOROSCOPY TIME:  Fluoroscopy Time: 17 minutes 6 seconds (3,779 mGy). COMPLICATIONS: None PROCEDURE: Informed consent was obtained from the patient following explanation of the procedure, risks, benefits and alternatives. The patient understands, agrees and consents for the procedure. All questions were addressed. A time out was performed prior to the initiation of the procedure. Maximal barrier sterile technique utilized including caps, mask, sterile gowns, sterile gloves, large sterile drape, hand hygiene, and Betadine prep. Ultrasound survey of the right inguinal region was performed with images stored and sent to PACs, confirming patency of the vessel. A micropuncture needle was used access the right common femoral artery under ultrasound. With excellent arterial blood flow returned, and an .018 micro wire was passed through the needle, observed enter the abdominal aorta under fluoroscopy. The  needle was removed, and a micropuncture sheath was placed over the wire. The inner dilator and wire were removed, and an 035 Bentson wire was advanced under fluoroscopy into the abdominal aorta. The sheath was removed and a standard 5 [place] vascular sheath was placed. The dilator was removed and the sheath was flushed. C2 cobra catheter was advanced on the Bentson wire into the abdominal aorta. Catheter was used to select the celiac artery. Angiogram was  performed. Glidewire was used to navigate the C2 catheter into the common hepatic artery. Angiogram was performed. 135 cm high-flow Renegade microcatheter was then used with an 014 fathom wire to select the branches of interest of the right hepatic artery. The initial catheter course was into the right gastric artery. Wire was removed and angiogram was performed to confirm location. Coil embolization was then performed with a low-profile Ruby micro coil, 2 mm x 4 mm. Once this was deployed catheter was withdrawn to the left hepatic artery. Angiogram was performed. Sub selection of a segment 2 and segment 4 common branch was performed with angiogram. Catheter was then advanced on the microwire into the gastroduodenal artery. Angiogram was performed. Microcatheter was then used to select the right hepatic artery. Angiogram was performed. We then isolated with sequential catheter placement the anterior and posterior division of the right hepatic artery, with angiogram performed in both branches of the division. This was done to isolate the artery of interest to the right-sided HCC at the border of segments 7 and 8. The majority of flow appears to be from segment 8 branch. Ultimately we headache successful sub selection of the segment 8 branch, confirming robust arterial flow to the tumor from the segment 8 branch. The MAA was delivered after diluting into 6 cc of saline. All catheters wires were removed. Exoseal was deployed for hemostasis. Patient tolerated the procedure well and remained hemodynamically stable throughout. No complications were encountered and no significant blood loss FINDINGS: Ultrasound demonstrates patent common femoral artery with moderate calcified plaque. Angiogram of the celiac artery confirms typical configuration with a fairly horizontal course of the celiac artery and the right common hepatic artery. Angiogram of the common hepatic artery demonstrates no high-grade stenosis with good  caliber. There is a essential trifurcation of the right hepatic artery left hepatic artery and the gastroduodenal artery. There is a very proximal division of the left hepatic artery to the segment 4/left branch and the segment 3 branch. Angiogram of the right gastric artery and the left hepatic artery demonstrates a very proximal origin of the right gastric from the left hepatic artery. Right gastric artery was coil embolized with a single coil. Angiogram of the left hepatic artery demonstrates a very proximal division, with what appears to be a fairly unique arrangement of the segment 2 branch and segment 4 branch, with a separate segment 3 artery. There did not appear to be any parasitized flow to the right side from the segment 4 branch or segment 2 branch. Gastroduodenal artery injection demonstrates some proximal supra duodenal branches, which do not appear to be in a dangerous location for the future planned treatment. GDA was not embolized given our future plan and the location of the tumor. Angiogram of the right hepatic artery demonstrates patent cystic artery and patent caudate arteries. The division of the anterior and posterior branches is in a fairly vertical segment with the majority of flow to the tumor from the segment 8 branch. Angiogram of the posterior division demonstrates no significant flow to  the tumor on the border of 7 and 8. Robust arterial perfusion/flow was isolated into the single segment 8 branch. This was the branch that the MAA was delivered. Our future plan would be a more distal position within the segment 8 artery for better isolation to the tumor and decreased collateral normal liver perfusion. This should be reasonable with a more distal base catheter position as well as a smaller microcatheter. IMPRESSION: Status post ultrasound guided access right common femoral artery for mesenteric angiogram Y 90 mapping, coil embolization of the right gastric artery, and delivery of MAA  into right hepatic artery segment 8. ExoSeal for hemostasis. Signed, Dulcy Fanny. Dellia Nims, RPVI [facility] Radiology Electronically Signed   By: Corrie Mckusick D.O.   On: 06/16/2020 13:18   IR Angiogram Selective Each Additional Vessel  Result Date: 06/16/2020 INDICATION: 74 year old male with right liver HCC presents for Y 90 mapping EXAM: ULTRASOUND-GUIDED ACCESS RIGHT COMMON FEMORAL ARTERY MESENTERIC ANGIOGRAM COIL EMBOLIZATION OF RIGHT GASTRIC ARTERY DELIVERY OF MAA EXOSEAL FOR HEMOSTASIS MEDICATIONS: None ANESTHESIA/SEDATION: Moderate (conscious) sedation was employed during this procedure. A total of Versed 3.0 mg and Fentanyl 100 mcg was administered intravenously. Moderate Sedation Time: 76 minutes minutes. The patient's level of consciousness and vital signs were monitored continuously by radiology nursing throughout the procedure under my direct supervision. CONTRAST:  81m OMNIPAQUE IOHEXOL 300 MG/ML SOLN, 645mOMNIPAQUE IOHEXOL 300 MG/ML SOLN, 10051mMNIPAQUE IOHEXOL 300 MG/ML SOLN FLUOROSCOPY TIME:  Fluoroscopy Time: 17 minutes 6 seconds (3,779 mGy). COMPLICATIONS: None PROCEDURE: Informed consent was obtained from the patient following explanation of the procedure, risks, benefits and alternatives. The patient understands, agrees and consents for the procedure. All questions were addressed. A time out was performed prior to the initiation of the procedure. Maximal barrier sterile technique utilized including caps, mask, sterile gowns, sterile gloves, large sterile drape, hand hygiene, and Betadine prep. Ultrasound survey of the right inguinal region was performed with images stored and sent to PACs, confirming patency of the vessel. A micropuncture needle was used access the right common femoral artery under ultrasound. With excellent arterial blood flow returned, and an .018 micro wire was passed through the needle, observed enter the abdominal aorta  under fluoroscopy. The needle was removed, and a micropuncture sheath was placed over the wire. The inner dilator and wire were removed, and an 035 Bentson wire was advanced under fluoroscopy into the abdominal aorta. The sheath was removed and a standard 5 FrePakistanscular sheath was placed. The dilator was removed and the sheath was flushed. C2 cobra catheter was advanced on the Bentson wire into the abdominal aorta. Catheter was used to select the celiac artery. Angiogram was performed. Glidewire was used to navigate the C2 catheter into the common hepatic artery. Angiogram was performed. 135 cm high-flow Renegade microcatheter was then used with an 014 fathom wire to select the branches of interest of the right hepatic artery. The initial catheter course was into the right gastric artery. Wire was removed and angiogram was performed to confirm location. Coil embolization was then performed with a low-profile Ruby micro coil, 2 mm x 4 mm. Once this was deployed catheter was withdrawn to the left hepatic artery. Angiogram was performed. Sub selection of a segment 2 and segment 4 common branch was performed with angiogram. Catheter was then advanced on the microwire into the gastroduodenal artery. Angiogram was performed. Microcatheter was then used to select the right hepatic artery. Angiogram was performed. We then isolated  with sequential catheter placement the anterior and posterior division of the right hepatic artery, with angiogram performed in both branches of the division. This was done to isolate the artery of interest to the right-sided HCC at the border of segments 7 and 8. The majority of flow appears to be from segment 8 branch. Ultimately we headache successful sub selection of the segment 8 branch, confirming robust arterial flow to the tumor from the segment 8 branch. The MAA was delivered after diluting into 6 cc of saline. All catheters wires were removed. Exoseal was deployed for hemostasis.  Patient tolerated the procedure well and remained hemodynamically stable throughout. No complications were encountered and no significant blood loss FINDINGS: Ultrasound demonstrates patent common femoral artery with moderate calcified plaque. Angiogram of the celiac artery confirms typical configuration with a fairly horizontal course of the celiac artery and the right common hepatic artery. Angiogram of the common hepatic artery demonstrates no high-grade stenosis with good caliber. There is a essential trifurcation of the right hepatic artery left hepatic artery and the gastroduodenal artery. There is a very proximal division of the left hepatic artery to the segment 4/left branch and the segment 3 branch. Angiogram of the right gastric artery and the left hepatic artery demonstrates a very proximal origin of the right gastric from the left hepatic artery. Right gastric artery was coil embolized with a single coil. Angiogram of the left hepatic artery demonstrates a very proximal division, with what appears to be a fairly unique arrangement of the segment 2 branch and segment 4 branch, with a separate segment 3 artery. There did not appear to be any parasitized flow to the right side from the segment 4 branch or segment 2 branch. Gastroduodenal artery injection demonstrates some proximal supra duodenal branches, which do not appear to be in a dangerous location for the future planned treatment. GDA was not embolized given our future plan and the location of the tumor. Angiogram of the right hepatic artery demonstrates patent cystic artery and patent caudate arteries. The division of the anterior and posterior branches is in a fairly vertical segment with the majority of flow to the tumor from the segment 8 branch. Angiogram of the posterior division demonstrates no significant flow to the tumor on the border of 7 and 8. Robust arterial perfusion/flow was isolated into the single segment 8 branch. This was the  branch that the MAA was delivered. Our future plan would be a more distal position within the segment 8 artery for better isolation to the tumor and decreased collateral normal liver perfusion. This should be reasonable with a more distal base catheter position as well as a smaller microcatheter. IMPRESSION: Status post ultrasound guided access right common femoral artery for mesenteric angiogram Y 90 mapping, coil embolization of the right gastric artery, and delivery of MAA into right hepatic artery segment 8. ExoSeal for hemostasis. Signed, Dulcy Fanny. Dellia Nims, RPVI [facility]iology [facility] Radiology Electronically Signed   By: Corrie Mckusick D.O.   On: 06/16/2020 13:18   IR Angiogram Selective Each Additional Vessel  Result Date: 06/16/2020 INDICATION: 74 year old male with right liver HCC presents for Y 90 mapping EXAM: ULTRASOUND-GUIDED ACCESS RIGHT COMMON FEMORAL ARTERY MESENTERIC ANGIOGRAM COIL EMBOLIZATION OF RIGHT GASTRIC ARTERY DELIVERY OF MAA EXOSEAL FOR HEMOSTASIS MEDICATIONS: None ANESTHESIA/SEDATION: Moderate (conscious) sedation was employed during this procedure. A total of Versed 3.0 mg and Fentanyl 100 mcg was administered intravenously. Moderate Sedation Time: 76 minutes minutes. The patient's level of  consciousness and vital signs were monitored continuously by radiology nursing throughout the procedure under my direct supervision. CONTRAST:  64m OMNIPAQUE IOHEXOL 300 MG/ML SOLN, 638mOMNIPAQUE IOHEXOL 300 MG/ML SOLN, 10055mMNIPAQUE IOHEXOL 300 MG/ML SOLN FLUOROSCOPY TIME:  Fluoroscopy Time: 17 minutes 6 seconds (3,779 mGy). COMPLICATIONS: None PROCEDURE: Informed consent was obtained from the patient following explanation of the procedure, risks, benefits and alternatives. The patient understands, agrees and consents for the procedure. All questions were addressed. A time out was performed prior to the initiation of the procedure. Maximal barrier sterile  technique utilized including caps, mask, sterile gowns, sterile gloves, large sterile drape, hand hygiene, and Betadine prep. Ultrasound survey of the right inguinal region was performed with images stored and sent to PACs, confirming patency of the vessel. A micropuncture needle was used access the right common femoral artery under ultrasound. With excellent arterial blood flow returned, and an .018 micro wire was passed through the needle, observed enter the abdominal aorta under fluoroscopy. The needle was removed, and a micropuncture sheath was placed over the wire. The inner dilator and wire were removed, and an 035 Bentson wire was advanced under fluoroscopy into the abdominal aorta. The sheath was removed and a standard 5 FrePakistanscular sheath was placed. The dilator was removed and the sheath was flushed. C2 cobra catheter was advanced on the Bentson wire into the abdominal aorta. Catheter was used to select the celiac artery. Angiogram was performed. Glidewire was used to navigate the C2 catheter into the common hepatic artery. Angiogram was performed. 135 cm high-flow Renegade microcatheter was then used with an 014 fathom wire to select the branches of interest of the right hepatic artery. The initial catheter course was into the right gastric artery. Wire was removed and angiogram was performed to confirm location. Coil embolization was then performed with a low-profile Ruby micro coil, 2 mm x 4 mm. Once this was deployed catheter was withdrawn to the left hepatic artery. Angiogram was performed. Sub selection of a segment 2 and segment 4 common branch was performed with angiogram. Catheter was then advanced on the microwire into the gastroduodenal artery. Angiogram was performed. Microcatheter was then used to select the right hepatic artery. Angiogram was performed. We then isolated with sequential catheter placement the anterior and posterior division of the right hepatic artery, with angiogram  performed in both branches of the division. This was done to isolate the artery of interest to the right-sided HCC at the border of segments 7 and 8. The majority of flow appears to be from segment 8 branch. Ultimately we headache successful sub selection of the segment 8 branch, confirming robust arterial flow to the tumor from the segment 8 branch. The MAA was delivered after diluting into 6 cc of saline. All catheters wires were removed. Exoseal was deployed for hemostasis. Patient tolerated the procedure well and remained hemodynamically stable throughout. No complications were encountered and no significant blood loss FINDINGS: Ultrasound demonstrates patent common femoral artery with moderate calcified plaque. Angiogram of the celiac artery confirms typical configuration with a fairly horizontal course of the celiac artery and the right common hepatic artery. Angiogram of the common hepatic artery demonstrates no high-grade stenosis with good caliber. There is a essential trifurcation of the right hepatic artery left hepatic artery and the gastroduodenal artery. There is a very proximal division of the left hepatic artery to the segment 4/left branch and the segment 3 branch. Angiogram of the right gastric artery and the left hepatic artery  demonstrates a very proximal origin of the right gastric from the left hepatic artery. Right gastric artery was coil embolized with a single coil. Angiogram of the left hepatic artery demonstrates a very proximal division, with what appears to be a fairly unique arrangement of the segment 2 branch and segment 4 branch, with a separate segment 3 artery. There did not appear to be any parasitized flow to the right side from the segment 4 branch or segment 2 branch. Gastroduodenal artery injection demonstrates some proximal supra duodenal branches, which do not appear to be in a dangerous location for the future planned treatment. GDA was not embolized given our future plan  and the location of the tumor. Angiogram of the right hepatic artery demonstrates patent cystic artery and patent caudate arteries. The division of the anterior and posterior branches is in a fairly vertical segment with the majority of flow to the tumor from the segment 8 branch. Angiogram of the posterior division demonstrates no significant flow to the tumor on the border of 7 and 8. Robust arterial perfusion/flow was isolated into the single segment 8 branch. This was the branch that the MAA was delivered. Our future plan would be a more distal position within the segment 8 artery for better isolation to the tumor and decreased collateral normal liver perfusion. This should be reasonable with a more distal base catheter position as well as a smaller microcatheter. IMPRESSION: Status post ultrasound guided access right common femoral artery for mesenteric angiogram Y 90 mapping, coil embolization of the right gastric artery, and delivery of MAA into right hepatic artery segment 8. ExoSeal for hemostasis. Signed, Dulcy Fanny. Dellia Nims, RPVI [facility] Radiology Specialists [facility] Radiology Electronically Signed   By: Corrie Mckusick D.O.   On: 06/16/2020 13:18   IR Angiogram Selective Each Additional Vessel  Result Date: 06/16/2020 INDICATION: 74 year old male with right liver HCC presents for Y 90 mapping EXAM: ULTRASOUND-GUIDED ACCESS RIGHT COMMON FEMORAL ARTERY MESENTERIC ANGIOGRAM COIL EMBOLIZATION OF RIGHT GASTRIC ARTERY DELIVERY OF MAA EXOSEAL FOR HEMOSTASIS MEDICATIONS: None ANESTHESIA/SEDATION: Moderate (conscious) sedation was employed during this procedure. A total of Versed 3.0 mg and Fentanyl 100 mcg was administered intravenously. Moderate Sedation Time: 76 minutes minutes. The patient's level of consciousness and vital signs were monitored continuously by radiology nursing throughout the procedure under my direct supervision. CONTRAST:  20m OMNIPAQUE IOHEXOL 300 MG/ML SOLN, 651m OMNIPAQUE IOHEXOL 300 MG/ML SOLN, 10040mMNIPAQUE IOHEXOL 300 MG/ML SOLN FLUOROSCOPY TIME:  Fluoroscopy Time: 17 minutes 6 seconds (3,779 mGy). COMPLICATIONS: None PROCEDURE: Informed consent was obtained from the patient following explanation of the procedure, risks, benefits and alternatives. The patient understands, agrees and consents for the procedure. All questions were addressed. A time out was performed prior to the initiation of the procedure. Maximal barrier sterile technique utilized including caps, mask, sterile gowns, sterile gloves, large sterile drape, hand hygiene, and Betadine prep. Ultrasound survey of the right inguinal region was performed with images stored and sent to PACs, confirming patency of the vessel. A micropuncture needle was used access the right common femoral artery under ultrasound. With excellent arterial blood flow returned, and an .018 micro wire was passed through the needle, observed enter the abdominal aorta under fluoroscopy. The needle was removed, and a micropuncture sheath was placed over the wire. The inner dilator and wire were removed, and an 035 Bentson wire was advanced under fluoroscopy into the abdominal aorta. The sheath was removed and a standard 5 FrePakistanscular sheath was  placed. The dilator was removed and the sheath was flushed. C2 cobra catheter was advanced on the Bentson wire into the abdominal aorta. Catheter was used to select the celiac artery. Angiogram was performed. Glidewire was used to navigate the C2 catheter into the common hepatic artery. Angiogram was performed. 135 cm high-flow Renegade microcatheter was then used with an 014 fathom wire to select the branches of interest of the right hepatic artery. The initial catheter course was into the right gastric artery. Wire was removed and angiogram was performed to confirm location. Coil embolization was then performed with a low-profile Ruby micro coil, 2 mm x 4 mm. Once this was deployed catheter  was withdrawn to the left hepatic artery. Angiogram was performed. Sub selection of a segment 2 and segment 4 common branch was performed with angiogram. Catheter was then advanced on the microwire into the gastroduodenal artery. Angiogram was performed. Microcatheter was then used to select the right hepatic artery. Angiogram was performed. We then isolated with sequential catheter placement the anterior and posterior division of the right hepatic artery, with angiogram performed in both branches of the division. This was done to isolate the artery of interest to the right-sided HCC at the border of segments 7 and 8. The majority of flow appears to be from segment 8 branch. Ultimately we headache successful sub selection of the segment 8 branch, confirming robust arterial flow to the tumor from the segment 8 branch. The MAA was delivered after diluting into 6 cc of saline. All catheters wires were removed. Exoseal was deployed for hemostasis. Patient tolerated the procedure well and remained hemodynamically stable throughout. No complications were encountered and no significant blood loss FINDINGS: Ultrasound demonstrates patent common femoral artery with moderate calcified plaque. Angiogram of the celiac artery confirms typical configuration with a fairly horizontal course of the celiac artery and the right common hepatic artery. Angiogram of the common hepatic artery demonstrates no high-grade stenosis with good caliber. There is a essential trifurcation of the right hepatic artery left hepatic artery and the gastroduodenal artery. There is a very proximal division of the left hepatic artery to the segment 4/left branch and the segment 3 branch. Angiogram of the right gastric artery and the left hepatic artery demonstrates a very proximal origin of the right gastric from the left hepatic artery. Right gastric artery was coil embolized with a single coil. Angiogram of the left hepatic artery demonstrates a very  proximal division, with what appears to be a fairly unique arrangement of the segment 2 branch and segment 4 branch, with a separate segment 3 artery. There did not appear to be any parasitized flow to the right side from the segment 4 branch or segment 2 branch. Gastroduodenal artery injection demonstrates some proximal supra duodenal branches, which do not appear to be in a dangerous location for the future planned treatment. GDA was not embolized given our future plan and the location of the tumor. Angiogram of the right hepatic artery demonstrates patent cystic artery and patent caudate arteries. The division of the anterior and posterior branches is in a fairly vertical segment with the majority of flow to the tumor from the segment 8 branch. Angiogram of the posterior division demonstrates no significant flow to the tumor on the border of 7 and 8. Robust arterial perfusion/flow was isolated into the single segment 8 branch. This was the branch that the MAA was delivered. Our future plan would be a more distal position within the segment 8 artery for  better isolation to the tumor and decreased collateral normal liver perfusion. This should be reasonable with a more distal base catheter position as well as a smaller microcatheter. IMPRESSION: Status post ultrasound guided access right common femoral artery for mesenteric angiogram Y 90 mapping, coil embolization of the right gastric artery, and delivery of MAA into right hepatic artery segment 8. ExoSeal for hemostasis. Signed, Dulcy Fanny. Dellia Nims, RPVI [facility] Inter[facility] Radiology Special[facility] Radiology Electronically Signed   By: Corrie Mckusick D.O.   On: 06/16/2020 13:18   IR US Guide Vasc Access Right  Result Date: 06/16/2020 INDICATION: 74 year old male with right liver HCC presents for Y 90 mapping EXAM: ULTRASOUND-GUIDED ACCESS RIGHT COMMON FEMORAL ARTERY MESENTERIC ANGIOGRAM COIL EMBOLIZATION OF RIGHT GASTRIC ARTERY DELIVERY OF MAA  EXOSEAL FOR HEMOSTASIS MEDICATIONS: None ANESTHESIA/SEDATION: Moderate (conscious) sedation was employed during this procedure. A total of Versed 3.0 mg and Fentanyl 100 mcg was administered intravenously. Moderate Sedation Time: 76 minutes minutes. The patient's level of consciousness and vital signs were monitored continuously by radiology nursing throughout the procedure under my direct supervision. CONTRAST:  58m OMNIPAQUE IOHEXOL 300 MG/ML SOLN, 666mOMNIPAQUE IOHEXOL 300 MG/ML SOLN, 10022mMNIPAQUE IOHEXOL 300 MG/ML SOLN FLUOROSCOPY TIME:  Fluoroscopy Time: 17 minutes 6 seconds (3,779 mGy). COMPLICATIONS: None PROCEDURE: Informed consent was obtained from the patient following explanation of the procedure, risks, benefits and alternatives. The patient understands, agrees and consents for the procedure. All questions were addressed. A time out was performed prior to the initiation of the procedure. Maximal barrier sterile technique utilized including caps, mask, sterile gowns, sterile gloves, large sterile drape, hand hygiene, and Betadine prep. Ultrasound survey of the right inguinal region was performed with images stored and sent to PACs, confirming patency of the vessel. A micropuncture needle was used access the right common femoral artery under ultrasound. With excellent arterial blood flow returned, and an .018 micro wire was passed through the needle, observed enter the abdominal aorta under fluoroscopy. The needle was removed, and a micropuncture sheath was placed over the wire. The inner dilator and wire were removed, and an 035 Bentson wire was advanced under fluoroscopy into the abdominal aorta. The sheath was removed and a standard 5 Fre[place]istanscular sheath was placed. The dilator was removed and the sheath was flushed. C2 cobra catheter was advanced on the Bentson wire into the abdominal aorta. Catheter was used to select the celiac artery. Angiogram was performed. Glidewire was used to navigate  the C2 catheter into the common hepatic artery. Angiogram was performed. 135 cm high-flow Renegade microcatheter was then used with an 014 fathom wire to select the branches of interest of the right hepatic artery. The initial catheter course was into the right gastric artery. Wire was removed and angiogram was performed to confirm location. Coil embolization was then performed with a low-profile Ruby micro coil, 2 mm x 4 mm. Once this was deployed catheter was withdrawn to the left hepatic artery. Angiogram was performed. Sub selection of a segment 2 and segment 4 common branch was performed with angiogram. Catheter was then advanced on the microwire into the gastroduodenal artery. Angiogram was performed. Microcatheter was then used to select the right hepatic artery. Angiogram was performed. We then isolated with sequential catheter placement the anterior and posterior division of the right hepatic artery, with angiogram performed in both branches of the division. This was done to isolate the artery of interest to the right-sided HCC at the border of segments 7 and 8.  The majority of flow appears to be from segment 8 branch. Ultimately we headache successful sub selection of the segment 8 branch, confirming robust arterial flow to the tumor from the segment 8 branch. The MAA was delivered after diluting into 6 cc of saline. All catheters wires were removed. Exoseal was deployed for hemostasis. Patient tolerated the procedure well and remained hemodynamically stable throughout. No complications were encountered and no significant blood loss FINDINGS: Ultrasound demonstrates patent common femoral artery with moderate calcified plaque. Angiogram of the celiac artery confirms typical configuration with a fairly horizontal course of the celiac artery and the right common hepatic artery. Angiogram of the common hepatic artery demonstrates no high-grade stenosis with good caliber. There is a essential trifurcation of  the right hepatic artery left hepatic artery and the gastroduodenal artery. There is a very proximal division of the left hepatic artery to the segment 4/left branch and the segment 3 branch. Angiogram of the right gastric artery and the left hepatic artery demonstrates a very proximal origin of the right gastric from the left hepatic artery. Right gastric artery was coil embolized with a single coil. Angiogram of the left hepatic artery demonstrates a very proximal division, with what appears to be a fairly unique arrangement of the segment 2 branch and segment 4 branch, with a separate segment 3 artery. There did not appear to be any parasitized flow to the right side from the segment 4 branch or segment 2 branch. Gastroduodenal artery injection demonstrates some proximal supra duodenal branches, which do not appear to be in a dangerous location for the future planned treatment. GDA was not embolized given our future plan and the location of the tumor. Angiogram of the right hepatic artery demonstrates patent cystic artery and patent caudate arteries. The division of the anterior and posterior branches is in a fairly vertical segment with the majority of flow to the tumor from the segment 8 branch. Angiogram of the posterior division demonstrates no significant flow to the tumor on the border of 7 and 8. Robust arterial perfusion/flow was isolated into the single segment 8 branch. This was the branch that the MAA was delivered. Our future plan would be a more distal position within the segment 8 artery for better isolation to the tumor and decreased collateral normal liver perfusion. This should be reasonable with a more distal base catheter position as well as a smaller microcatheter. IMPRESSION: Status post ultrasound guided access right common femoral artery for mesenteric angiogram Y 90 mapping, coil embolization of the right gastric artery, and delivery of MAA into right hepatic artery segment 8. ExoSeal for  hemostasis. Signed, Dulcy Fanny. Dellia Nims, RPVI Vascular and Interventional Radiology Specialists [facility] Radiology Electronically Signed   By: Corrie Mckusick D.O.   On: 06/16/2020 13:18   IR EMBO ARTERIAL NOT HEMORR HEMANG INC GUIDE ROADMAPPING  Result Date: 06/16/2020 INDICATION: 74 year old male with right liver HCC presents for Y 90 mapping EXAM: ULTRASOUND-GUIDED ACCESS RIGHT COMMON FEMORAL ARTERY MESENTERIC ANGIOGRAM COIL EMBOLIZATION OF RIGHT GASTRIC ARTERY DELIVERY OF MAA EXOSEAL FOR HEMOSTASIS MEDICATIONS: None ANESTHESIA/SEDATION: Moderate (conscious) sedation was employed during this procedure. A total of Versed 3.0 mg and Fentanyl 100 mcg was administered intravenously. Moderate Sedation Time: 76 minutes minutes. The patient's level of consciousness and vital signs were monitored continuously by radiology nursing throughout the procedure under my direct supervision. CONTRAST:  75m OMNIPAQUE IOHEXOL 300 MG/ML SOLN, 68mOMNIPAQUE IOHEXOL 300 MG/ML SOLN, 1006mMNIPAQUE IOHEXOL 300 MG/ML SOLN FLUOROSCOPY TIME:  Fluoroscopy  Time: 17 minutes 6 seconds (3,779 mGy). COMPLICATIONS: None PROCEDURE: Informed consent was obtained from the patient following explanation of the procedure, risks, benefits and alternatives. The patient understands, agrees and consents for the procedure. All questions were addressed. A time out was performed prior to the initiation of the procedure. Maximal barrier sterile technique utilized including caps, mask, sterile gowns, sterile gloves, large sterile drape, hand hygiene, and Betadine prep. Ultrasound survey of the right inguinal region was performed with images stored and sent to PACs, confirming patency of the vessel. A micropuncture needle was used access the right common femoral artery under ultrasound. With excellent arterial blood flow returned, and an .018 micro wire was passed through the needle, observed enter the abdominal aorta under fluoroscopy. The needle was  removed, and a micropuncture sheath was placed over the wire. The inner dilator and wire were removed, and an 035 Bentson wire was advanced under fluoroscopy into the abdominal aorta. The sheath was removed and a standard 5 [place] vascular sheath was placed. The dilator was removed and the sheath was flushed. C2 cobra catheter was advanced on the Bentson wire into the abdominal aorta. Catheter was used to select the celiac artery. Angiogram was performed. Glidewire was used to navigate the C2 catheter into the common hepatic artery. Angiogram was performed. 135 cm high-flow Renegade microcatheter was then used with an 014 fathom wire to select the branches of interest of the right hepatic artery. The initial catheter course was into the right gastric artery. Wire was removed and angiogram was performed to confirm location. Coil embolization was then performed with a low-profile Ruby micro coil, 2 mm x 4 mm. Once this was deployed catheter was withdrawn to the left hepatic artery. Angiogram was performed. Sub selection of a segment 2 and segment 4 common branch was performed with angiogram. Catheter was then advanced on the microwire into the gastroduodenal artery. Angiogram was performed. Microcatheter was then used to select the right hepatic artery. Angiogram was performed. We then isolated with sequential catheter placement the anterior and posterior division of the right hepatic artery, with angiogram performed in both branches of the division. This was done to isolate the artery of interest to the right-sided HCC at the border of segments 7 and 8. The majority of flow appears to be from segment 8 branch. Ultimately we headache successful sub selection of the segment 8 branch, confirming robust arterial flow to the tumor from the segment 8 branch. The MAA was delivered after diluting into 6 cc of saline. All catheters wires were removed. Exoseal was deployed for hemostasis. Patient tolerated the procedure well  and remained hemodynamically stable throughout. No complications were encountered and no significant blood loss FINDINGS: Ultrasound demonstrates patent common femoral artery with moderate calcified plaque. Angiogram of the celiac artery confirms typical configuration with a fairly horizontal course of the celiac artery and the right common hepatic artery. Angiogram of the common hepatic artery demonstrates no high-grade stenosis with good caliber. There is a essential trifurcation of the right hepatic artery left hepatic artery and the gastroduodenal artery. There is a very proximal division of the left hepatic artery to the segment 4/left branch and the segment 3 branch. Angiogram of the right gastric artery and the left hepatic artery demonstrates a very proximal origin of the right gastric from the left hepatic artery. Right gastric artery was coil embolized with a single coil. Angiogram of the left hepatic artery demonstrates a very proximal division, with what appears to be a  fairly unique arrangement of the segment 2 branch and segment 4 branch, with a separate segment 3 artery. There did not appear to be any parasitized flow to the right side from the segment 4 branch or segment 2 branch. Gastroduodenal artery injection demonstrates some proximal supra duodenal branches, which do not appear to be in a dangerous location for the future planned treatment. GDA was not embolized given our future plan and the location of the tumor. Angiogram of the right hepatic artery demonstrates patent cystic artery and patent caudate arteries. The division of the anterior and posterior branches is in a fairly vertical segment with the majority of flow to the tumor from the segment 8 branch. Angiogram of the posterior division demonstrates no significant flow to the tumor on the border of 7 and 8. Robust arterial perfusion/flow was isolated into the single segment 8 branch. This was the branch that the MAA was delivered. Our  future plan would be a more distal position within the segment 8 artery for better isolation to the tumor and decreased collateral normal liver perfusion. This should be reasonable with a more distal base catheter position as well as a smaller microcatheter. IMPRESSION: Status post ultrasound guided access right common femoral artery for mesenteric angiogram Y 90 mapping, coil embolization of the right gastric artery, and delivery of MAA into right hepatic artery segment 8. ExoSeal for hemostasis. Signed, Dulcy Fanny. Dellia Nims, RPVI Vascular and Interventional Radiology Specialists [facility] Radiology Electronically Signed   By: Corrie Mckusick D.O.   On: 06/16/2020 13:18   NM FUSION  Result Date: 06/16/2020 CLINICAL DATA:  Hepatocellular carcinoma. Solitary RIGHT hepatic lobe lesion. Pre yttrium 90 radio embolization evaluation. EXAM: NUCLEAR MEDICINE LIVER SCAN; ULTRASOUND MISCELLANEOUS SOFT TISSUE TECHNIQUE: Abdominal images were obtained in multiple projections after intrahepatic arterial injection of radiopharmaceutical. SPECT imaging was performed. Lung shunt calculation was performed. RADIOPHARMACEUTICALS:  4.4 millicuries technetium MAA COMPARISON:  MRI 05/13/2020 FINDINGS: The injected microaggregated albumin localizes within the RIGHT hepatic lobe. Focal uptake noted within the RIGHT hepatic lobe solitary lesion. No evidence of activity within the stomach, duodenum, or bowel. Calculated shunt fraction to the lungs equals 6.3%. IMPRESSION: 1. No significant extrahepatic radiotracer activity following intrahepatic arterial injection of MAA. 2. Localization to the RIGHT hepatic lobe with concentration in solitary lesion. 3. Lung shunt fraction equals 6.3% Electronically Signed   By: Suzy Bouchard M.D.   On: 06/16/2020 15:09    Labs:  CBC: Recent Labs    04/23/20 0417 05/06/20 0954 06/01/20 1414 06/16/20 0845  WBC 3.8* 3.7* 5.0 4.8  HGB 8.0* 8.8* 7.6* 8.3*  HCT 28.1* 28.9* 26.8* 28.8*  PLT  129* 179 137* 229    COAGS: Recent Labs    11/01/19 0603 01/03/20 0518 04/15/20 2102 06/16/20 0845  INR 1.2 1.2 1.1 1.2    BMP: Recent Labs    04/21/20 0840 04/21/20 0840 04/22/20 0953 04/23/20 0417 06/01/20 1414 06/16/20 0845  NA 136   < > 137 139 141 137  K 4.4   < > 4.4 4.0 3.7 3.8  CL 104   < > 103 103 102 102  CO2 25   < > _0 GLUCOSE 135*   < > 190* 105* 149* 134*  BUN 12   < > 16 19 24* 21  CALCIUM 9.1   < > 9.1 8.7* 8.5* 8.7*  CREATININE 1.20   < > 1.27* 1.49* 1.06 0.94  GFRNONAA 59*   < > 55* 46* >  60 >60  GFRAA >60  --  >60 53* >60  --    < > = values in this interval not displayed.    LIVER FUNCTION TESTS: Recent Labs    04/21/20 0840 04/23/20 0417 06/01/20 1414 06/16/20 0845  BILITOT 1.2 0.9 1.0 0.8  AST _0 ALT _1 ALKPHOS 115 99 112 143*  PROT 6.7 6.2* 6.9 7.3  ALBUMIN 3.4* 3.2* 3.6 3.8    TUMOR MARKERS: No results for input(s): AFPTM, CEA, CA199, CHROMGRNA in the last 8760 hours.  Assessment and Plan:  History of CAD s/p stent placement in 2585, CHF, alcoholic cirrhosis, portal hypertension, esophageal and gastric varices and HCC (4 cm in the right lobe segment 8). Patient initially since on 9.8.21 in clinic with Dr. Earleen Newport for evaluation and deemed an appropriate candidate for  Y90. On 10.19.21 the Patient had a MAA performed to the segment 8 branch with a  coil embolization of the right gastric artery. Patient presents for Y90 to segment 8 of the right hepatic lobe.  No additional imaging since MAA. All labs and medications are within acceptable parameters. NKDA. Patient has been NPO since midnight.    Thank you for this interesting consult.  I greatly enjoyed meeting [name] and look forward to participating in their care.  A copy of this report was sent to the requesting provider on this date.  Risks and benefits discussed with the patient including, but not limited to bleeding, infection, vascular injury,  post procedural pain, nausea, vomiting and fatigue, contrast induced renal failure, liver failure, radiation injury to the bowel, radiation induced cholecystitis, neutropenia and possible need for additional procedures.  All of the patient's questions were answered, patient is agreeable to proceed. Consent signed and in chart.    Electronically Signed: Jacqualine Mau, NP 06/30/2020, 8:08 AM   I spent a total of  40 Minutes   in face to face in clinical consultation, greater than 50% of which was counseling/coordinating care for Y90 segment 8.

## 2020-06-30 NOTE — Sedation Documentation (Signed)
Baseline O2 sats on room air 82% bilateral lung fields diminished no crackles or wheezes noted, 3+ pitting edema bilat lower extremities.

## 2020-06-30 NOTE — Procedures (Signed)
Interventional Radiology Procedure Note  Procedure:   US guided right CFA access Mesenteric angiogram, with superselective catheter position in the segment 8 hepatic artery branch y90 treatment with delivery of prescribed dose to the tumor Exoseal for hemostasis   Complications: None  Recommendations:  - right hip straight x 4 hours - to NM for post Bremsstrahlung scan - Ok to shower tomorrow - Do not submerge for 7 days - Routine care - advance diet - OK to DC in 4 hours when goals met - follow up with Dr. Earleen Newport in ~4 weeks   Signed,  Dulcy Fanny. Earleen Newport, DO

## 2020-06-30 NOTE — Sedation Documentation (Signed)
Baseline O2 sats on  Room air 85%

## 2020-06-30 NOTE — Discharge Instructions (Signed)
Please call Interventional Radiology clinic [phone] with any questions or concerns.  You may remove your dressing and shower tomorrow.   Hepatic Artery Radioembolization, Care After This sheet gives you information about how to care for yourself after your procedure. Your health care provider may also give you more specific instructions. If you have problems or questions, contact your health care provider. What can I expect after the procedure? After the procedure, it is common to have:  A slight fever for 1-2 weeks. If your fever gets worse, tell your health care provider.  Fatigue.  Loss of appetite. This should gradually improve after about 1 week.  Abdominal pain on your right side.  Soreness and tenderness in your groin area where the needle and catheter were placed (puncture site). Follow these instructions at home:  Puncture site care  Follow instructions from your health care provider about how to take care of the puncture site. Make sure you: ? Wash your hands with soap and water before you change your bandage (dressing). If soap and water are not available, use hand sanitizer. ? Change your dressing as told by your health care provider. ? Leave stitches (sutures), skin glue, or adhesive strips in place. These skin closures may need to stay in place for 2 weeks or longer. If adhesive strip edges start to loosen and curl up, you may trim the loose edges. Do not remove adhesive strips completely unless your health care provider tells you to do that.  Check your puncture site every day for signs of infection. Check for: ? More redness, swelling, or pain. ? More fluid or blood. ? Warmth. ? Pus or a bad smell. Activity  Rest and return to your normal activities as told by your health care provider. Ask your health care provider what activities are safe for you.  Do not drive for 24 hours after the procedure if you were given a medicine to help you relax (sedative).  Do  not lift anything that is heavier than 10 lb (4.5 kg) until your health care provider says that it is safe. Medicines  Take over-the-counter and prescription medicines only as told by your health care provider.  Do not drive or use heavy machinery while taking prescription pain medicine. Radiation precautions  For up to a week after your procedure, there will be a small amount of radioactivity near your liver. This is not especially dangerous to other people. However, you should follow these precautions for 7 days: ? Do not come in close contact with people. ? Do not sleep in the same bed as someone else. ? Do not hold children or babies. ? Do not have contact with pregnant women. General instructions  To prevent or treat constipation while you are taking prescription pain medicine, your health care provider may recommend that you: ? Drink enough fluid to keep your urine clear or pale yellow. ? Take over-the-counter or prescription medicines. ? Eat foods that are high in fiber, such as fresh fruits and vegetables, whole grains, and beans. ? Limit foods that are high in fat and processed sugars, such as fried and sweet foods.  Eat frequent small meals until your appetite returns. Follow instructions from your health care provider about eating or drinking restrictions.  Do not take baths, swim, or use a hot tub until your health care provider approves. You may take showers. Wash your puncture site with mild soap and water and pat the area dry.  Wear compression stockings as told by your  health care provider. These stockings help to prevent blood clots and reduce swelling in your legs.  Keep all follow-up visits as told by your health care provider. This is important. You may need to have blood tests and imaging tests done. Contact a health care provider if:  You have more redness, swelling, or pain around your puncture site.  You have more fluid or blood coming from your puncture  site.  Your puncture site feels warm to the touch.  You have pus or a bad smell coming from your puncture site.  You have pain that: ? Gets worse. ? Does not get better with medicine. ? Feels like very bad heartburn. ? Is in the middle of your abdomen, above your belly button.  Your skin and the white parts of your eyes turn yellow (jaundice).  The color of your urine changes to dark brown.  The color of your stool changes to light yellow.  Your abdominal measurement (girth) increases in a short period of time.  You gain more than 5 lb (2.3 kg) in a short period of time. Get help right away if:  You have a fever that lasts longer than 2 weeks or is higher than what your health care provider told you to expect.  You develop any of the following in your legs: ? Pain. ? Swelling. ? Skin that is cold or pale or turns blue.  You have chest pain.  You have blood in your vomit, saliva, or stool.  You have trouble breathing. This information is not intended to replace advice given to you by your health care provider. Make sure you discuss any questions you have with your health care provider. Document Revised: 07/28/2017 Document Reviewed: 05/14/2016 Elsevier Patient Education  [place] Radioembolization Discharge Instructions  You have been given a radioactive material during your procedure.  While it is safe for you to be discharged home from the hospital, you need to proceed directly home.    Do not use public transportation, including air travel, lasting more than 2 hours for 1 week.  Avoid crowded public places for 1 week.  Adult visitors should try to avoid close contact with you for 1 week.    Children and pregnant females should not visit or have close contact with you for 1 week.  Items that you touch are not radioactive.  Do not sleep in the same bed as your partner for 1 week, and a condom should be used for sexual activity during the  first 24 hours.  Your blood may be radioactive and caution should be used if any bleeding occurs during the recovery period.  Body fluids may be radioactive for 24 hours.  Wash your hands after voiding.  Men should sit to urinate.  Dispose of any soiled materials (flush down toilet or place in trash at home) during the first day.  Drink 6 to 8 glasses of fluids per day for 5 days to hydrate yourself.  If you need to see a doctor during the first week, you must let them know that you were treated with yttrium-90 microspheres, and will be slightly radioactive.  They can call Interventional Radiology [phone] with any questions.   Moderate Conscious Sedation, Adult, Care After These instructions provide you with information about caring for yourself after your procedure. Your health care provider may also give you more specific instructions. Your treatment has been planned according to current medical practices, but problems sometimes occur. Call your health  care provider if you have any problems or questions after your procedure. What can I expect after the procedure? After your procedure, it is common:  To feel sleepy for several hours.  To feel clumsy and have poor balance for several hours.  To have poor judgment for several hours.  To vomit if you eat too soon. Follow these instructions at home: For at least 24 hours after the procedure:   Do not: ? Participate in activities where you could fall or become injured. ? Drive. ? Use heavy machinery. ? Drink alcohol. ? Take sleeping pills or medicines that cause drowsiness. ? Make important decisions or sign legal documents. ? Take care of children on your own.  Rest. Eating and drinking  Follow the diet recommended by your health care provider.  If you vomit: ? Drink water, juice, or soup when you can drink without vomiting. ? Make sure you have little or no nausea before eating solid foods. General instructions  Have a  responsible adult stay with you until you are awake and alert.  Take over-the-counter and prescription medicines only as told by your health care provider.  If you smoke, do not smoke without supervision.  Keep all follow-up visits as told by your health care provider. This is important. Contact a health care provider if:  You keep feeling nauseous or you keep vomiting.  You feel light-headed.  You develop a rash.  You have a fever. Get help right away if:  You have trouble breathing. This information is not intended to replace advice given to you by your health care provider. Make sure you discuss any questions you have with your health care provider. Document Revised: 07/28/2017 Document Reviewed: 12/05/2015 Elsevier Patient Education  2020 [employer].

## 2020-06-30 NOTE — Sedation Documentation (Signed)
Transfer to short stay

## 2020-07-02 ENCOUNTER — Ambulatory Visit (INDEPENDENT_AMBULATORY_CARE_PROVIDER_SITE_OTHER): Payer: Medicare HMO | Admitting: Orthotics

## 2020-07-02 ENCOUNTER — Other Ambulatory Visit: Payer: Self-pay

## 2020-07-02 DIAGNOSIS — M2142 Flat foot [pes planus] (acquired), left foot: Secondary | ICD-10-CM | POA: Diagnosis not present

## 2020-07-02 DIAGNOSIS — E1142 Type 2 diabetes mellitus with diabetic polyneuropathy: Secondary | ICD-10-CM | POA: Diagnosis not present

## 2020-07-02 DIAGNOSIS — M79675 Pain in left toe(s): Secondary | ICD-10-CM

## 2020-07-02 DIAGNOSIS — M2141 Flat foot [pes planus] (acquired), right foot: Secondary | ICD-10-CM

## 2020-07-02 DIAGNOSIS — B351 Tinea unguium: Secondary | ICD-10-CM | POA: Diagnosis not present

## 2020-07-02 DIAGNOSIS — L84 Corns and callosities: Secondary | ICD-10-CM

## 2020-07-02 DIAGNOSIS — M79674 Pain in right toe(s): Secondary | ICD-10-CM

## 2020-08-03 ENCOUNTER — Other Ambulatory Visit: Payer: Self-pay | Admitting: Internal Medicine

## 2020-08-03 DIAGNOSIS — E118 Type 2 diabetes mellitus with unspecified complications: Secondary | ICD-10-CM

## 2020-08-04 ENCOUNTER — Other Ambulatory Visit: Payer: Self-pay | Admitting: Interventional Radiology

## 2020-08-04 DIAGNOSIS — C220 Liver cell carcinoma: Secondary | ICD-10-CM

## 2020-08-09 ENCOUNTER — Inpatient Hospital Stay (HOSPITAL_COMMUNITY)
Admission: EM | Admit: 2020-08-09 | Discharge: 2020-08-18 | DRG: 377 | Disposition: A | Discharge status: Home Health | Payer: Medicare HMO | Attending: Family Medicine | Admitting: Family Medicine

## 2020-08-09 ENCOUNTER — Other Ambulatory Visit: Payer: Self-pay

## 2020-08-09 ENCOUNTER — Encounter (HOSPITAL_COMMUNITY): Payer: Self-pay | Admitting: Emergency Medicine

## 2020-08-09 ENCOUNTER — Emergency Department (HOSPITAL_COMMUNITY): Payer: Medicare HMO

## 2020-08-09 DIAGNOSIS — E861 Hypovolemia: Secondary | ICD-10-CM | POA: Diagnosis not present

## 2020-08-09 DIAGNOSIS — J449 Chronic obstructive pulmonary disease, unspecified: Secondary | ICD-10-CM | POA: Diagnosis present

## 2020-08-09 DIAGNOSIS — D61818 Other pancytopenia: Secondary | ICD-10-CM | POA: Diagnosis present

## 2020-08-09 DIAGNOSIS — E1122 Type 2 diabetes mellitus with diabetic chronic kidney disease: Secondary | ICD-10-CM | POA: Diagnosis present

## 2020-08-09 DIAGNOSIS — D6959 Other secondary thrombocytopenia: Secondary | ICD-10-CM | POA: Diagnosis present

## 2020-08-09 DIAGNOSIS — D649 Anemia, unspecified: Secondary | ICD-10-CM | POA: Diagnosis not present

## 2020-08-09 DIAGNOSIS — Z833 Family history of diabetes mellitus: Secondary | ICD-10-CM

## 2020-08-09 DIAGNOSIS — I130 Hypertensive heart and chronic kidney disease with heart failure and stage 1 through stage 4 chronic kidney disease, or unspecified chronic kidney disease: Secondary | ICD-10-CM | POA: Diagnosis not present

## 2020-08-09 DIAGNOSIS — Z800 Family history of malignant neoplasm of digestive organs: Secondary | ICD-10-CM

## 2020-08-09 DIAGNOSIS — I509 Heart failure, unspecified: Secondary | ICD-10-CM | POA: Diagnosis not present

## 2020-08-09 DIAGNOSIS — I8510 Secondary esophageal varices without bleeding: Secondary | ICD-10-CM | POA: Diagnosis not present

## 2020-08-09 DIAGNOSIS — Z7984 Long term (current) use of oral hypoglycemic drugs: Secondary | ICD-10-CM

## 2020-08-09 DIAGNOSIS — Z20822 Contact with and (suspected) exposure to covid-19: Secondary | ICD-10-CM | POA: Diagnosis not present

## 2020-08-09 DIAGNOSIS — K922 Gastrointestinal hemorrhage, unspecified: Secondary | ICD-10-CM | POA: Diagnosis present

## 2020-08-09 DIAGNOSIS — K921 Melena: Principal | ICD-10-CM | POA: Diagnosis present

## 2020-08-09 DIAGNOSIS — K766 Portal hypertension: Secondary | ICD-10-CM | POA: Diagnosis not present

## 2020-08-09 DIAGNOSIS — Z87891 Personal history of nicotine dependence: Secondary | ICD-10-CM

## 2020-08-09 DIAGNOSIS — I10 Essential (primary) hypertension: Secondary | ICD-10-CM | POA: Diagnosis not present

## 2020-08-09 DIAGNOSIS — E1142 Type 2 diabetes mellitus with diabetic polyneuropathy: Secondary | ICD-10-CM | POA: Diagnosis present

## 2020-08-09 DIAGNOSIS — E8770 Fluid overload, unspecified: Secondary | ICD-10-CM

## 2020-08-09 DIAGNOSIS — D62 Acute posthemorrhagic anemia: Secondary | ICD-10-CM | POA: Diagnosis present

## 2020-08-09 DIAGNOSIS — M109 Gout, unspecified: Secondary | ICD-10-CM | POA: Diagnosis present

## 2020-08-09 DIAGNOSIS — K219 Gastro-esophageal reflux disease without esophagitis: Secondary | ICD-10-CM | POA: Diagnosis present

## 2020-08-09 DIAGNOSIS — K3189 Other diseases of stomach and duodenum: Secondary | ICD-10-CM | POA: Diagnosis present

## 2020-08-09 DIAGNOSIS — E669 Obesity, unspecified: Secondary | ICD-10-CM | POA: Diagnosis present

## 2020-08-09 DIAGNOSIS — R0602 Shortness of breath: Secondary | ICD-10-CM

## 2020-08-09 DIAGNOSIS — Z7189 Other specified counseling: Secondary | ICD-10-CM | POA: Diagnosis not present

## 2020-08-09 DIAGNOSIS — K7031 Alcoholic cirrhosis of liver with ascites: Secondary | ICD-10-CM | POA: Diagnosis not present

## 2020-08-09 DIAGNOSIS — J811 Chronic pulmonary edema: Secondary | ICD-10-CM | POA: Diagnosis not present

## 2020-08-09 DIAGNOSIS — Z955 Presence of coronary angioplasty implant and graft: Secondary | ICD-10-CM

## 2020-08-09 DIAGNOSIS — E876 Hypokalemia: Secondary | ICD-10-CM | POA: Diagnosis not present

## 2020-08-09 DIAGNOSIS — Z6838 Body mass index (BMI) 38.0-38.9, adult: Secondary | ICD-10-CM

## 2020-08-09 DIAGNOSIS — Z79899 Other long term (current) drug therapy: Secondary | ICD-10-CM

## 2020-08-09 DIAGNOSIS — J90 Pleural effusion, not elsewhere classified: Secondary | ICD-10-CM | POA: Diagnosis not present

## 2020-08-09 DIAGNOSIS — Z87820 Personal history of traumatic brain injury: Secondary | ICD-10-CM

## 2020-08-09 DIAGNOSIS — R001 Bradycardia, unspecified: Secondary | ICD-10-CM | POA: Diagnosis present

## 2020-08-09 DIAGNOSIS — N1831 Chronic kidney disease, stage 3a: Secondary | ICD-10-CM | POA: Diagnosis not present

## 2020-08-09 DIAGNOSIS — G4733 Obstructive sleep apnea (adult) (pediatric): Secondary | ICD-10-CM | POA: Diagnosis not present

## 2020-08-09 DIAGNOSIS — E785 Hyperlipidemia, unspecified: Secondary | ICD-10-CM | POA: Diagnosis present

## 2020-08-09 DIAGNOSIS — R0600 Dyspnea, unspecified: Secondary | ICD-10-CM

## 2020-08-09 DIAGNOSIS — R0609 Other forms of dyspnea: Secondary | ICD-10-CM

## 2020-08-09 DIAGNOSIS — M545 Low back pain, unspecified: Secondary | ICD-10-CM | POA: Diagnosis present

## 2020-08-09 DIAGNOSIS — Z951 Presence of aortocoronary bypass graft: Secondary | ICD-10-CM

## 2020-08-09 DIAGNOSIS — R69 Illness, unspecified: Secondary | ICD-10-CM | POA: Diagnosis not present

## 2020-08-09 DIAGNOSIS — I2510 Atherosclerotic heart disease of native coronary artery without angina pectoris: Secondary | ICD-10-CM | POA: Diagnosis not present

## 2020-08-09 DIAGNOSIS — I864 Gastric varices: Secondary | ICD-10-CM | POA: Diagnosis not present

## 2020-08-09 DIAGNOSIS — I5033 Acute on chronic diastolic (congestive) heart failure: Secondary | ICD-10-CM | POA: Diagnosis present

## 2020-08-09 DIAGNOSIS — I878 Other specified disorders of veins: Secondary | ICD-10-CM | POA: Diagnosis present

## 2020-08-09 DIAGNOSIS — E118 Type 2 diabetes mellitus with unspecified complications: Secondary | ICD-10-CM

## 2020-08-09 DIAGNOSIS — Z9119 Patient's noncompliance with other medical treatment and regimen: Secondary | ICD-10-CM

## 2020-08-09 DIAGNOSIS — Z8249 Family history of ischemic heart disease and other diseases of the circulatory system: Secondary | ICD-10-CM

## 2020-08-09 DIAGNOSIS — I517 Cardiomegaly: Secondary | ICD-10-CM | POA: Diagnosis not present

## 2020-08-09 DIAGNOSIS — K7460 Unspecified cirrhosis of liver: Secondary | ICD-10-CM | POA: Diagnosis not present

## 2020-08-09 DIAGNOSIS — I5030 Unspecified diastolic (congestive) heart failure: Secondary | ICD-10-CM | POA: Diagnosis present

## 2020-08-09 DIAGNOSIS — G8929 Other chronic pain: Secondary | ICD-10-CM | POA: Diagnosis present

## 2020-08-09 DIAGNOSIS — C220 Liver cell carcinoma: Secondary | ICD-10-CM | POA: Diagnosis not present

## 2020-08-09 DIAGNOSIS — Z515 Encounter for palliative care: Secondary | ICD-10-CM | POA: Diagnosis not present

## 2020-08-09 DIAGNOSIS — Z801 Family history of malignant neoplasm of trachea, bronchus and lung: Secondary | ICD-10-CM

## 2020-08-09 DIAGNOSIS — N182 Chronic kidney disease, stage 2 (mild): Secondary | ICD-10-CM | POA: Diagnosis present

## 2020-08-09 HISTORY — DX: Unspecified diastolic (congestive) heart failure: I50.30

## 2020-08-09 LAB — CBC
HCT: 20 % — ABNORMAL LOW (ref 39.0–52.0)
Hemoglobin: 5.5 g/dL — CL (ref 13.0–17.0)
MCH: 21.9 pg — ABNORMAL LOW (ref 26.0–34.0)
MCHC: 27.5 g/dL — ABNORMAL LOW (ref 30.0–36.0)
MCV: 79.7 fL — ABNORMAL LOW (ref 80.0–100.0)
Platelets: 138 10*3/uL — ABNORMAL LOW (ref 150–400)
RBC: 2.51 MIL/uL — ABNORMAL LOW (ref 4.22–5.81)
RDW: 18.1 % — ABNORMAL HIGH (ref 11.5–15.5)
WBC: 3.5 10*3/uL — ABNORMAL LOW (ref 4.0–10.5)
nRBC: 0 % (ref 0.0–0.2)

## 2020-08-09 LAB — BASIC METABOLIC PANEL
Anion gap: 11 (ref 5–15)
BUN: 30 mg/dL — ABNORMAL HIGH (ref 8–23)
CO2: 23 mmol/L (ref 22–32)
Calcium: 8.5 mg/dL — ABNORMAL LOW (ref 8.9–10.3)
Chloride: 102 mmol/L (ref 98–111)
Creatinine, Ser: 1.23 mg/dL (ref 0.61–1.24)
GFR, Estimated: >60 mL/min (ref >60)
Glucose, Bld: 94 mg/dL (ref 70–99)
Potassium: 3.6 mmol/L (ref 3.5–5.1)
Sodium: 136 mmol/L (ref 135–145)

## 2020-08-09 LAB — POC OCCULT BLOOD, ED: Fecal Occult Bld: POSITIVE — AB

## 2020-08-09 LAB — RESP PANEL BY RT-PCR (FLU A&B, COVID) ARPGX2
Influenza A by PCR: NEGATIVE
Influenza B by PCR: NEGATIVE
SARS Coronavirus 2 by RT PCR: NEGATIVE

## 2020-08-09 LAB — PREPARE RBC (CROSSMATCH)

## 2020-08-09 LAB — CBG MONITORING, ED: Glucose-Capillary: 96 mg/dL (ref 70–99)

## 2020-08-09 LAB — TROPONIN I (HIGH SENSITIVITY)
Troponin I (High Sensitivity): 13 ng/L (ref <18)
Troponin I (High Sensitivity): 13 ng/L (ref <18)

## 2020-08-09 LAB — BRAIN NATRIURETIC PEPTIDE: B Natriuretic Peptide: 326.3 pg/mL — ABNORMAL HIGH (ref 0.0–100.0)

## 2020-08-09 MED ORDER — FUROSEMIDE 10 MG/ML IJ SOLN
40.0000 mg | Freq: Once | INTRAMUSCULAR | Status: AC
  Administered 2020-08-09: 40 mg via INTRAVENOUS
  Filled 2020-08-09: qty 4

## 2020-08-09 MED ORDER — ALBUTEROL SULFATE HFA 108 (90 BASE) MCG/ACT IN AERS: 2.0000 | INHALATION_SPRAY | Freq: Four times a day (QID) | RESPIRATORY_TRACT | Status: DC | PRN

## 2020-08-09 MED ORDER — INSULIN ASPART 100 UNIT/ML ~~LOC~~ SOLN
0.0000 [IU] | Freq: Three times a day (TID) | SUBCUTANEOUS | Status: DC
  Administered 2020-08-11: 4 [IU] via SUBCUTANEOUS
  Administered 2020-08-12: 7 [IU] via SUBCUTANEOUS
  Administered 2020-08-12: 3 [IU] via SUBCUTANEOUS
  Administered 2020-08-13: 4 [IU] via SUBCUTANEOUS
  Administered 2020-08-14 – 2020-08-15 (×2): 7 [IU] via SUBCUTANEOUS
  Administered 2020-08-15 – 2020-08-16 (×2): 3 [IU] via SUBCUTANEOUS
  Administered 2020-08-16 – 2020-08-17 (×2): 4 [IU] via SUBCUTANEOUS
  Administered 2020-08-17: 3 [IU] via SUBCUTANEOUS
  Administered 2020-08-18: 4 [IU] via SUBCUTANEOUS

## 2020-08-09 MED ORDER — SENNA 8.6 MG PO TABS: 1.0000 | ORAL_TABLET | Freq: Every day | ORAL | Status: DC | PRN

## 2020-08-09 MED ORDER — LOSARTAN POTASSIUM 50 MG PO TABS: 25.0000 mg | ORAL_TABLET | Freq: Every day | ORAL | Status: DC

## 2020-08-09 MED ORDER — ACETAMINOPHEN 650 MG RE SUPP: 650.0000 mg | Freq: Four times a day (QID) | RECTAL | Status: DC | PRN

## 2020-08-09 MED ORDER — UMECLIDINIUM-VILANTEROL 62.5-25 MCG/INH IN AEPB
1.0000 | INHALATION_SPRAY | Freq: Every day | RESPIRATORY_TRACT | Status: DC
  Filled 2020-08-09: qty 14

## 2020-08-09 MED ORDER — ATORVASTATIN CALCIUM 40 MG PO TABS
40.0000 mg | ORAL_TABLET | Freq: Every day | ORAL | Status: DC
  Administered 2020-08-11 – 2020-08-18 (×8): 40 mg via ORAL
  Filled 2020-08-09 (×9): qty 1

## 2020-08-09 MED ORDER — SODIUM CHLORIDE 0.9% IV SOLUTION: Freq: Once | INTRAVENOUS | Status: AC

## 2020-08-09 MED ORDER — FUROSEMIDE 10 MG/ML IJ SOLN
40.0000 mg | Freq: Four times a day (QID) | INTRAMUSCULAR | Status: AC
  Administered 2020-08-10 – 2020-08-11 (×5): 40 mg via INTRAVENOUS
  Filled 2020-08-09 (×5): qty 4

## 2020-08-09 MED ORDER — INSULIN GLARGINE 100 UNIT/ML ~~LOC~~ SOLN
10.0000 [IU] | Freq: Every day | SUBCUTANEOUS | Status: DC
  Administered 2020-08-10 – 2020-08-17 (×8): 10 [IU] via SUBCUTANEOUS
  Filled 2020-08-09 (×10): qty 0.1

## 2020-08-09 MED ORDER — ACETAMINOPHEN 325 MG PO TABS
650.0000 mg | ORAL_TABLET | Freq: Four times a day (QID) | ORAL | Status: DC | PRN
  Administered 2020-08-12: 650 mg via ORAL
  Filled 2020-08-09: qty 2

## 2020-08-09 MED ORDER — SODIUM CHLORIDE 0.9 % IV SOLN: INTRAVENOUS | Status: DC

## 2020-08-09 MED ORDER — NITROGLYCERIN 0.4 MG SL SUBL: 0.4000 mg | SUBLINGUAL_TABLET | SUBLINGUAL | Status: DC | PRN

## 2020-08-09 MED ORDER — SODIUM CHLORIDE 0.9 % IV SOLN
80.0000 mg | Freq: Once | INTRAVENOUS | Status: AC
  Administered 2020-08-09: 80 mg via INTRAVENOUS
  Filled 2020-08-09: qty 80

## 2020-08-09 MED ORDER — SODIUM CHLORIDE 0.9 % IV SOLN
8.0000 mg/h | INTRAVENOUS | Status: DC
  Administered 2020-08-10 – 2020-08-12 (×5): 8 mg/h via INTRAVENOUS
  Filled 2020-08-09 (×10): qty 80

## 2020-08-09 MED ORDER — ALBUTEROL SULFATE (2.5 MG/3ML) 0.083% IN NEBU: 2.5000 mg | INHALATION_SOLUTION | RESPIRATORY_TRACT | Status: DC | PRN

## 2020-08-09 MED ORDER — GABAPENTIN 100 MG PO CAPS
100.0000 mg | ORAL_CAPSULE | Freq: Two times a day (BID) | ORAL | Status: DC
  Administered 2020-08-09 – 2020-08-18 (×17): 100 mg via ORAL
  Filled 2020-08-09 (×18): qty 1

## 2020-08-09 MED ORDER — MORPHINE SULFATE (PF) 2 MG/ML IV SOLN: 2.0000 mg | INTRAVENOUS | Status: DC | PRN

## 2020-08-09 NOTE — ED Notes (Signed)
Patient placed on 3L [place] for comfort. Patient co sob while maintaing adequate spo2 saturation.

## 2020-08-09 NOTE — H&P (Signed)
History and Physical    [name] [id number]:[id number] DOB: 02/04/1946 DOA: 08/09/2020  PCP: [name], MD Patient coming from: home  I have personally briefly reviewed patient's old medical records in [place]  Chief Complaint: acute SOB, increased peripheral edema, black stools  HPI: [name] is a 74 y.o. male with medical history significant of CAD s/p CABG, COPD,  HFpEF, Cirrhosis of the liver with known varices, h/o GI bleeding, hepatocellular carcinoma developed acute SOB/DOE at home. He also had increased peripheral edema. He reports having black stools. Due to his acute symptoms he presents to [facility] for evaluation.   ED Course: T 97.9  131/47  HR 74  RR 16. ED exam revealed patient to have SOB, wheezing and rales on lung exam,  anasarca to lover abdomen. Lab revelaed Cr 1.23, BNP 326.3, Trop #1 13, #2 13, Hgb 5.5 down from baseline of approximately 8. CXR reveals increased pulmonary edema. In the ED he was given IV lasix 40 mg, he was to be crossmatched and transfused 2 uPRBCs, he was to be started on IV PPI with bolus and infusion. GI consulted who agreed to see patient 08/10/20. [facility] called to admit patient for continued management of GI bleed and acute on Chronic HFpEF.  Review of Systems: As per HPI otherwise 10 point review of systems negative.    Past Medical History:  Diagnosis Date  . Arthritis    "joints tighten up sometimes" (03/27/2104)  . Carcinoma of liver, hepatocellular ([place]) 06/30/2020  . CHF (congestive heart failure) ([place])   . Chronic lower back pain   . Coronary artery disease    a. 05/2012 Cath/PCI: LM 40ost, 50-60d, LAD 99p ruptured plaque (3.0x28 DES), LCX 50p/m, RCA 30-40p, 46m EF 65-70%.  . Coronary artery disease involving native coronary artery of native heart without angina pectoris    Severe left main disease at catheterization July 2015  CABG x3 with a LIMA to the LAD, SVG to the OM, SVG to the PDA on 03/31/14. EF 60% by cath.   .  Diastolic heart failure ([place]   . GERD without esophagitis 08/04/2010  . Hepatic cirrhosis ([place]    a. Dx 01/2014 - CT a/p   . History of blood transfusion    "related to bleeding ulcers"  . History of concussion    1976--  NO RESIDUAL  . History of GI bleed    a. UGIB 07/2012;  b. 01/2014 admission with GIB/FOB stool req 1U prbc's->EGD showed portal gastropathy, barrett's esoph, and chronic active h. pylori gastritis.  .Marland KitchenHistory of gout    2007 &  2008  LEFT LEG-- NO ISSUE SINCE  . Hyperlipidemia   . Iron deficiency anemia   . Kidney stones   . OA (osteoarthritis of spine)    LOWER BACK--  INTERMITTANT LEFT LEG NUMBNESS  . OSA (obstructive sleep apnea)    PULMOLOGIST-  DR CLANCE--  MODERATE OSA  STARTED CPAP 2012--  BUT CURRENTLY HAS NOT USED PAST 6 MONTHS  . Phimosis    a. s/p circumcision 2015.  . Type 2 diabetes mellitus ([place]   . Unspecified essential hypertension     Past Surgical History:  Procedure Laterality Date  . ANKLE FRACTURE SURGERY Right 1989   "plate put in"  . APPENDECTOMY  05-16-2004   open  . CIRCUMCISION N/A 09/09/2013   Procedure: CIRCUMCISION ADULT;  Surgeon: DBernestine Amass MD;  [facility]  Service: Urology;  Laterality: N/A;  .  COLECTOMY  05-16-2004  . CORONARY ANGIOPLASTY WITH STENT PLACEMENT  06/28/2012  DR COOPER   PCI W/  X1 DES to [place]. LAD/  LM  40% OSTIAL & 50-60% DISTAL /  50% PROX LCX/  30-40% PROX RCA & 50% MID RCA/   LVEF 65-70%  . CORONARY ARTERY BYPASS GRAFT N/A 03/31/2014   Procedure: CORONARY ARTERY BYPASS GRAFTING (CABG) times 3 using left internal mammary artery and right saphenous vein.;  Surgeon: Melrose Nakayama, MD;  Location: [facility];  Service: Open Heart Surgery;  Laterality: N/A;  . DOBUTAMINE STRESS ECHO  06-08-2012   MODERATE HYPOKINESIS/ ISCHEMIA MID INFERIOR WALL  . ESOPHAGOGASTRODUODENOSCOPY  08/15/2012   Procedure: ESOPHAGOGASTRODUODENOSCOPY (EGD);  Surgeon: Wonda Horner, MD;  Location: [facility]  ENDOSCOPY;  Service: Endoscopy;  Laterality: N/A;  . ESOPHAGOGASTRODUODENOSCOPY N/A 02/17/2014   Procedure: ESOPHAGOGASTRODUODENOSCOPY (EGD);  Surgeon: Jeryl Columbia, MD;  Location[facility] ENDOSCOPY;  Service: Endoscopy;  Laterality: N/A;  . ESOPHAGOGASTRODUODENOSCOPY N/A 04/17/2020   Procedure: ESOPHAGOGASTRODUODENOSCOPY (EGD);  Surgeon: Ronnette Juniper, MD;  Location: [place];  Service: Gastroenterology;  Laterality: N/A;  . ESOPHAGOGASTRODUODENOSCOPY (EGD) WITH PROPOFOL N/A 09/30/2017   Procedure: ESOPHAGOGASTRODUODENOSCOPY (EGD) WITH PROPOFOL;  Surgeon: Wonda Horner, MD;  Location[facility] ENDOSCOPY;  Service: Endoscopy;  Laterality: N/A;  . ESOPHAGOGASTRODUODENOSCOPY (EGD) WITH PROPOFOL N/A 12/20/2017   Procedure: ESOPHAGOGASTRODUODENOSCOPY (EGD) WITH PROPOFOL;  Surgeon: Clarene Essex, MD;  Location: [place];  Service: Endoscopy;  Laterality: N/A;  . HERNIA REPAIR    . INTRAOPERATIVE TRANSESOPHAGEAL ECHOCARDIOGRAM N/A 03/31/2014   Procedure: INTRAOPERATIVE TRANSESOPHAGEAL ECHOCARDIOGRAM;  Surgeon: Melrose Nakayama, MD;  Location: [place];  Service: Open Heart Surgery;  Laterality: N/A;  . IR ANGIOGRAM SELECTIVE EACH ADDITIONAL VESSEL  06/16/2020  . IR ANGIOGRAM SELECTIVE EACH ADDITIONAL VESSEL  06/16/2020  . IR ANGIOGRAM SELECTIVE EACH ADDITIONAL VESSEL  06/16/2020  . IR ANGIOGRAM SELECTIVE EACH ADDITIONAL VESSEL  06/16/2020  . IR ANGIOGRAM SELECTIVE EACH ADDITIONAL VESSEL  06/16/2020  . IR ANGIOGRAM SELECTIVE EACH ADDITIONAL VESSEL  06/16/2020  . IR ANGIOGRAM SELECTIVE EACH ADDITIONAL VESSEL  06/16/2020  . IR ANGIOGRAM SELECTIVE EACH ADDITIONAL VESSEL  06/16/2020  . IR ANGIOGRAM SELECTIVE EACH ADDITIONAL VESSEL  06/16/2020  . IR ANGIOGRAM SELECTIVE EACH ADDITIONAL VESSEL  06/30/2020  . IR ANGIOGRAM SELECTIVE EACH ADDITIONAL VESSEL  06/30/2020  . IR ANGIOGRAM VISCERAL SELECTIVE  06/16/2020  . IR ANGIOGRAM VISCERAL SELECTIVE  06/30/2020  . IR EMBO ARTERIAL NOT HEMORR HEMANG INC GUIDE ROADMAPPING   06/16/2020  . IR EMBO TUMOR ORGAN ISCHEMIA INFARCT INC GUIDE ROADMAPPING  06/30/2020  . IR RADIOLOGIST EVAL & MGMT  05/06/2020  . IR RADIOLOGIST EVAL & MGMT  05/20/2020  . IR US GUIDE VASC ACCESS RIGHT  06/16/2020  . IR US GUIDE VASC ACCESS RIGHT  06/30/2020  . LAPAROSCOPIC UMBILICAL HERNIA REPAIR W/ MESH  06-06-2011  . LEFT HEART CATHETERIZATION WITH CORONARY ANGIOGRAM N/A 03/28/2014   Procedure: LEFT HEART CATHETERIZATION WITH CORONARY ANGIOGRAM;  Surgeon: Sinclair Grooms, MD;  Location: [facility] CATH LAB;  Service: Cardiovascular;  Laterality: N/A;  . LIPOMA EXCISION Left 08/25/2017   Procedure: EXCISION LIPOMA LEFT POSTERIOR THIGH;  Surgeon: Clovis Riley, MD;  Location: [place];  Service: General;  Laterality: Left;  . NEPHROLITHOTOMY  1990'S  . OPEN APPENDECTOMY W/ PARTIAL CECECTOMY  05-16-2004  . PERCUTANEOUS CORONARY STENT INTERVENTION (PCI-S) N/A 06/28/2012   Procedure: PERCUTANEOUS CORONARY STENT INTERVENTION (PCI-S);  Surgeon: Sherren Mocha, MD;  Location: [facility] CATH LAB;  Service: Cardiovascular;  Laterality:  N/A;    Soc Hx - married in 1969. He has 2 sons, 2 daughters, 6 grandchildren. He lives with his wife and one of his sons. 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 He was a heavy drinker until Nov '21.    reports that he quit smoking about 23 years ago. His smoking use included cigarettes. He has a 80.00 pack-year smoking history. He has never used smokeless tobacco. He reports current alcohol use of about 8.0 standard drinks of alcohol per week. He reports that he does not use drugs.  No Known Allergies  Family History  Problem Relation Age of Onset  . Lung cancer Sister   . Cancer Sister        lung  . Cancer Mother   . Cancer Father        died in his 49s.  . Cancer Brother        lung  . Coronary artery disease Other   . Diabetes Other   . Colon cancer Other   . Cancer Sister        lung     Prior to Admission medications    Medication Sig Start Date End Date Taking? Authorizing Provider  albuterol (VENTOLIN HFA) 108 (90 Base) MCG/ACT inhaler Inhale 2 puffs into the lungs every 6 (six) hours as needed for wheezing or shortness of breath. 03/12/20  Yes [name], MD  atorvastatin (LIPITOR) 40 MG tablet TAKE 1 TABLET BY MOUTH DAILY AT 6 PM Patient taking differently: Take by mouth daily. 05/13/20  Yes [name], MD  ferrous sulfate 325 (65 FE) MG EC tablet Take 1 tablet (325 mg total) by mouth daily with breakfast. 01/16/20  Yes [name], MD  gabapentin (NEURONTIN) 100 MG capsule TAKE 1 CAPSULE(100 MG) BY MOUTH TWICE DAILY Patient taking differently: Take 100 mg by mouth 2 (two) times daily. 08/03/20  Yes [name], MD  glimepiride (AMARYL) 2 MG tablet Take 2 mg by mouth daily. 07/13/20  Yes [provider]  metFORMIN (GLUCOPHAGE) 850 MG tablet TAKE 1 TABLET(850 MG) BY MOUTH TWICE DAILY WITH A MEAL Patient taking differently: Take 850 mg by mouth 2 (two) times daily with a meal. 06/16/20  Yes [name], MD  nitroGLYCERIN (NITROSTAT) 0.4 MG SL tablet Place 1 tablet (0.4 mg total) under the tongue every 5 (five) minutes as needed for chest pain. 06/08/16  Yes Nahser, Wonda Cheng, MD  Sennosides (EX-LAX PO) Take 1 tablet by mouth daily as needed (constipation).   Yes [provider]  torsemide (DEMADEX) 20 MG tablet Take 40 mg (2 pills) every morning and every afternoon except Mon, Wed, Fri afternoons take only 20 mg (1 pill) 02/06/20  Yes Nahser, Wonda Cheng, MD  umeclidinium-vilanterol (ANORO ELLIPTA) 62.5-25 MCG/INH AEPB Inhale 1 puff into the lungs daily. 03/12/20  Yes [name], MD  Accu-Chek Softclix Lancets lancets USE AS DIRECTED TO TEST BLOOD SUGAR FOUR TIMES DAILY 03/13/20   [name], MD  blood glucose meter kit and supplies Dispense based on patient and insurance preference. Use up to four times daily as directed. (FOR  ICD-10 E10.9, E11.9). 03/12/20   [name], MD  pantoprazole (PROTONIX) 40 MG tablet Take 1 tablet (40 mg total) by mouth daily. Patient not taking: Reported on 08/09/2020 04/23/20   Pokhrel, Corrie Mckusick, MD  polyethylene glycol (MIRALAX / GLYCOLAX) 17 g packet Take 17 g by mouth daily as needed for  moderate constipation. Patient not taking: Reported on 08/09/2020 04/23/20   Flora Lipps, MD  spironolactone (ALDACTONE) 25 MG tablet Take 1 tablet (25 mg total) by mouth daily. Patient not taking: Reported on 08/09/2020 01/04/20   Nita Sells, MD    Physical Exam: Vitals:   08/09/20 2056 08/09/20 2115 08/09/20 2130 08/09/20 2205  BP: (!) 149/87 (!) 141/76 126/73 140/63  Pulse: 74 74 80 77  Resp: 16 16 (!) 21 20  Temp: 97.7 F (36.5 C) 97.9 F (36.6 C)    TempSrc: Oral Oral    SpO2: 99% 100% 100% 100%  Weight:      Height:         Vitals:   08/09/20 2056 08/09/20 2115 08/09/20 2130 08/09/20 2205  BP: (!) 149/87 (!) 141/76 126/73 140/63  Pulse: 74 74 80 77  Resp: 16 16 (!) 21 20  Temp: 97.7 F (36.5 C) 97.9 F (36.6 C)    TempSrc: Oral Oral    SpO2: 99% 100% 100% 100%  Weight:      Height:       General:  Chronically ill appearing man looking older than chronologic age who is SOB. Eyes: PERRL, lids and conjunctivae normal ENMT: Mucous membranes are moist. Posterior pharynx clear of any exudate or lesions.Edentulous..  Neck: normal, supple, no masses, no thyromegaly Chest:- well healed sternotomy scar Respiratory: Mild increased WOB sitting up. Diffuse rales 2/3 up his back. No wheezing appreciated. Prolonged expiratory phase noted.  Cardiovascular: Regular rate and rhythm, no murmurs / rubs / gallops. No JVD in the upright position. Chronic LE edema with skin changes, 3+ pitting edema to knees and 1+ pitting edema to mid-abdomen.   Abdomen: obese. no tenderness, no masses palpated. No hepatosplenomegaly but exam hindered by girth and inability to lay flat. .  Bowel sounds positive.  Musculoskeletal: no clubbing / cyanosis. No joint deformity upper and lower extremities. Good ROM, no contractures. Normal muscle tone.  Skin: Changes of chronic venous stasis bilateral lower extremities to distal LE. no rashes, lesions, ulcers.  Neurologic: CN 2-12 grossly intact.  Strength 5/5 in all 4.  Psychiatric: Normal judgment and insight. Alert and oriented x 3. Normal mood.     Labs on Admission: I have personally reviewed following labs and imaging studies  CBC: Recent Labs  Lab 08/09/20 1733  WBC 3.5*  HGB 5.5*  HCT 20.0*  MCV 79.7*  PLT 628*   Basic Metabolic Panel: Recent Labs  Lab 08/09/20 1733  NA 136  K 3.6  CL 102  CO2 23  GLUCOSE 94  BUN 30*  CREATININE 1.23  CALCIUM 8.5*   GFR: Estimated Creatinine Clearance: 62.4 mL/min (by C-G formula based on SCr of 1.23 mg/dL). Liver Function Tests: No results for input(s): AST, ALT, ALKPHOS, BILITOT, PROT, ALBUMIN in the last 168 hours. No results for input(s): LIPASE, AMYLASE in the last 168 hours. No results for input(s): AMMONIA in the last 168 hours. Coagulation Profile: No results for input(s): INR, PROTIME in the last 168 hours. Cardiac Enzymes: No results for input(s): CKTOTAL, CKMB, CKMBINDEX, TROPONINI in the last 168 hours. BNP (last 3 results) No results for input(s): PROBNP in the last 8760 hours. HbA1C: No results for input(s): HGBA1C in the last 72 hours. CBG: No results for input(s): GLUCAP in the last 168 hours. Lipid Profile: No results for input(s): CHOL, HDL, LDLCALC, TRIG, CHOLHDL, LDLDIRECT in the last 72 hours. Thyroid Function Tests: No results for input(s): TSH, T4TOTAL, FREET4, T3FREE, THYROIDAB in  the last 72 hours. Anemia Panel: No results for input(s): VITAMINB12, FOLATE, FERRITIN, TIBC, IRON, RETICCTPCT in the last 72 hours. Urine analysis:    Component Value Date/Time   COLORURINE YELLOW 05/06/2020 [place] 05/06/2020 0954    LABSPEC 1.009 05/06/2020 0954   PHURINE 7.0 05/06/2020 0954   GLUCOSEU NEGATIVE 05/06/2020 0954   HGBUR NEGATIVE 05/06/2020 0954   BILIRUBINUR NEGATIVE 07/29/2019 1849   KETONESUR NEGATIVE 05/06/2020 0954   PROTEINUR NEGATIVE 05/06/2020 0954   UROBILINOGEN 0.2 03/29/2014 1546   NITRITE NEGATIVE 05/06/2020 0954   LEUKOCYTESUR NEGATIVE 05/06/2020 0954    Radiological Exams on Admission: DG Chest 2 View  Result Date: 08/09/2020 CLINICAL DATA:  Shortness of breath with exertion. EXAM: CHEST - 2 VIEW COMPARISON:  04/15/2020 FINDINGS: Median sternotomy and prior CABG. Stable cardiomegaly. Unchanged mediastinal contours with aortic atherosclerosis. Pulmonary edema with slight increase from prior exam. Minimal blunting of the costophrenic angles consistent with small effusions. There is fluid in the fissures. No confluent consolidation. No pneumothorax. No acute osseous abnormalities are seen. IMPRESSION: Increased pulmonary edema from prior exam with small pleural effusions. Stable cardiomegaly. Aortic Atherosclerosis (ICD10-I70.0). Electronically Signed   By: Keith Rake M.D.   On: 08/09/2020 17:24    EKG: Independently reviewed. Junctional rhythm, IRBBB, increased QT interval. No acute changes, no STEMI  Assessment/Plan Active Problems:   Hepatocellular carcinoma (HCC)   Type 2 diabetes with complication (HCC)   OSA (obstructive sleep apnea)   Hyperlipidemia with target LDL less than 70   GI bleed   CKD (chronic kidney disease), stage III (HCC)   (HFpEF) heart failure with preserved ejection fraction ([place])  (please populate well all problems here in Problem List. (For example, if patient is on BP meds at home and you resume or decide to hold them, it is a problem that needs to be her. Same for CAD, COPD, HLD and so on)   1. GI bleed - patient with chronic anemia with Hgb 8 range now with acute GI bleed with melena and heme positive stool, drop in Hgb to 5.5. Origin PUD vs bleeding  for varices.  Plan Step-down admit  Complete 2 uPRBC transfusion  H/H q 6 - keep Hgb 8 or greater  IV PPI with bolus  NPO /x sips with meds  GI to see 08/10/20 - possibly for EGD  2. HFpEF - patient with increased pulmonary edema. Troponins flat. No acute EKG changes. Last echo 05/27/20 with EF 60-65%, grade III diastolic dysfxn.  Plan IV lasix 40 mg q6 x 4, plus 40 mg between units of blood  Continue home meds  Add losartan 25 mg daily  3. CKD III - stable. Care with diuresis to avoid volume depletion.  4. DM - last A1C 5% 05/06/20  Plan Hold long acting sulfonylurea and metformin while acutely ill  Lantus 10 u qHS  Sliding scale coverage.   5. Oncology - hepatocellular carcinoma by NM scan and MRI, no tissue dx. Patient has had IR procedure with placement of Y90 microspheres-not curative.  Plan Patient is to schedule appointment with medical oncologist  6. Code status - full code. Discussed poor chances of successful resuscitation and poor chances of meaningful recovery after resuscitation with patient and with his son. Patient advised to discuss this further with Dr. Sharlet Salina. He nor his son have access to computer information, I.e. [email or web address] Or[employer].  7. TOC - consult requested.     DVT prophylaxis: SCDs if possible  Code Status: full  code  Family Communication: Spoke with son [name]: explained Dx, Tx plan and prognosis. Also discussed code status  Disposition Plan: TBD-most likely home when stable  Consults called: [facility]  Admission status: inpatient-stepdown    Adella Hare MD [facility] Pager [phone]  If 7PM-7AM, please contact night-coverage [email or web address] Password [facility]  08/09/2020, 10:20 PM   home

## 2020-08-09 NOTE — ED Notes (Signed)
Hgb 5.5.  Dr. Roslynn Amble and charge RN notified.

## 2020-08-09 NOTE — ED Notes (Signed)
Pt moving to treatment room.  Dr. Darl Householder notified of Hgb.

## 2020-08-09 NOTE — ED Triage Notes (Signed)
Pt reports SOB with exertion x 2 weeks.  Reports soreness to L chest from coughing.  Productive cough with white phlegm.

## 2020-08-09 NOTE — ED Provider Notes (Signed)
[facility] EMERGENCY DEPARTMENT Provider Note   CSN: [id number] Arrival date & time: 08/09/20  1631     History Chief Complaint  Patient presents with  . Shortness of Breath    [name] is a 74 y.o. male with a past medical history of diabetes, hypertension, GERD, sleep apnea, CAD status post CABG, hyperlipidemia, pancytopenia, hepatic cirrhosis, CKD, asthma, pulmonary hypertension, and anemia presents with worsening shortness of breath and dyspnea exertion over the past 2 weeks. Has chest pain when he coughs. Patient denies any exertional chest pain, abdominal pain, or fever.  ROS positive for lower extremity edema and melena.  The history is provided by the patient.  Shortness of Breath Severity:  Moderate Onset quality:  Gradual Duration:  2 weeks Timing:  Constant Progression:  Worsening Chronicity:  New Relieved by:  Rest Worsened by:  Exertion Associated symptoms: chest pain (only when coughing) and cough   Associated symptoms: no abdominal pain, no ear pain, no fever, no PND, no rash, no sore throat, no sputum production, no syncope and no vomiting   Risk factors: obesity   Risk factors: no hx of PE/DVT        Past Medical History:  Diagnosis Date  . Arthritis    "joints tighten up sometimes" (03/27/2104)  . Carcinoma of liver, hepatocellular ([place]) 06/30/2020  . CHF (congestive heart failure) ([place])   . Chronic lower back pain   . Coronary artery disease    a. 05/2012 Cath/PCI: LM 40ost, 50-60d, LAD 99p ruptured plaque (3.0x28 DES), LCX 50p/m, RCA 30-40p, 47m EF 65-70%.  . Coronary artery disease involving native coronary artery of native heart without angina pectoris    Severe left main disease at catheterization July 2015  CABG x3 with a LIMA to the LAD, SVG to the OM, SVG to the PDA on 03/31/14. EF 60% by cath.   . Diastolic heart failure ([place]   . GERD without esophagitis 08/04/2010  . Hepatic cirrhosis ([place]    a. Dx 01/2014 - CT a/p    . History of blood transfusion    "related to bleeding ulcers"  . History of concussion    1976--  NO RESIDUAL  . History of GI bleed    a. UGIB 07/2012;  b. 01/2014 admission with GIB/FOB stool req 1U prbc's->EGD showed portal gastropathy, barrett's esoph, and chronic active h. pylori gastritis.  .Marland KitchenHistory of gout    2007 &  2008  LEFT LEG-- NO ISSUE SINCE  . Hyperlipidemia   . Iron deficiency anemia   . Kidney stones   . OA (osteoarthritis of spine)    LOWER BACK--  INTERMITTANT LEFT LEG NUMBNESS  . OSA (obstructive sleep apnea)    PULMOLOGIST-  DR CLANCE--  MODERATE OSA  STARTED CPAP 2012--  BUT CURRENTLY HAS NOT USED PAST 6 MONTHS  . Phimosis    a. s/p circumcision 2015.  . Type 2 diabetes mellitus ([facility]   . Unspecified essential hypertension     Patient Active Problem List   Diagnosis Date Noted  . Hepatocellular carcinoma ([facility] 08/09/2020  . Routine general medical examination at a health care facility 05/06/2020  . Iron deficiency anemia   . Symptomatic anemia 04/15/2020  . Pulmonary hypertension, unspecified ([facility] 03/11/2020  . Asthma, mild intermittent 12/27/2019  . Alcoholic cirrhosis of liver with ascites ([facility] 12/27/2019  . (HFpEF) heart failure with preserved ejection fraction ([facility] 12/24/2018  . CKD (chronic kidney disease), stage III ([facility] 12/18/2017  . Peptic  ulcer disease 12/18/2017  . GI bleed 09/29/2017  . Morbid obesity ([place]) 02/05/2016  . S/P CABG x 3 03/31/2014  . Hepatic cirrhosis ([place]) 02/27/2014  . Hyperlipidemia with target LDL less than 70 06/29/2012  . Thrombocytopenia ([place]) 06/29/2012  . Coronary artery disease involving native coronary artery of native heart without angina pectoris   . OSA (obstructive sleep apnea)   . Type 2 diabetes with complication ([place]) 61/60/7371  . GERD without esophagitis 08/04/2010  . History of gout   . Hypertensive heart disease     Past Surgical History:  Procedure Laterality Date  . ANKLE FRACTURE SURGERY  Right 1989   "plate put in"  . APPENDECTOMY  05-16-2004   open  . CIRCUMCISION N/A 09/09/2013   Procedure: CIRCUMCISION ADULT;  Surgeon: Bernestine Amass, MD;  Location[facility];  Service: Urology;  Laterality: N/A;  . COLECTOMY  05-16-2004  . CORONARY ANGIOPLASTY WITH STENT PLACEMENT  06/28/2012  DR COOPER   PCI W/  X1 DES to [place]. LAD/  LM  40% OSTIAL & 50-60% DISTAL /  50% PROX LCX/  30-40% PROX RCA & 50% MID RCA/   LVEF 65-70%  . CORONARY ARTERY BYPASS GRAFT N/A 03/31/2014   Procedure: CORONARY ARTERY BYPASS GRAFTING (CABG) times 3 using left internal mammary artery and right saphenous vein.;  Surgeon: Melrose Nakayama, MD;  Location: [place];  Service: Open Heart Surgery;  Laterality: N/A;  . DOBUTAMINE STRESS ECHO  06-08-2012   MODERATE HYPOKINESIS/ ISCHEMIA MID INFERIOR WALL  . ESOPHAGOGASTRODUODENOSCOPY  08/15/2012   Procedure: ESOPHAGOGASTRODUODENOSCOPY (EGD);  Surgeon: Wonda Horner, MD;  Location[facility] ENDOSCOPY;  Service: Endoscopy;  Laterality: N/A;  . ESOPHAGOGASTRODUODENOSCOPY N/A 02/17/2014   Procedure: ESOPHAGOGASTRODUODENOSCOPY (EGD);  Surgeon: Jeryl Columbia, MD;  Location: [facility] ENDOSCOPY;  Service: Endoscopy;  Laterality: N/A;  . ESOPHAGOGASTRODUODENOSCOPY N/A 04/17/2020   Procedure: ESOPHAGOGASTRODUODENOSCOPY (EGD);  Surgeon: Ronnette Juniper, MD;  Location: [place];  Service: Gastroenterology;  Laterality: N/A;  . ESOPHAGOGASTRODUODENOSCOPY (EGD) WITH PROPOFOL N/A 09/30/2017   Procedure: ESOPHAGOGASTRODUODENOSCOPY (EGD) WITH PROPOFOL;  Surgeon: Wonda Horner, MD;  Location[facility] ENDOSCOPY;  Service: Endoscopy;  Laterality: N/A;  . ESOPHAGOGASTRODUODENOSCOPY (EGD) WITH PROPOFOL N/A 12/20/2017   Procedure: ESOPHAGOGASTRODUODENOSCOPY (EGD) WITH PROPOFOL;  Surgeon: Clarene Essex, MD;  Location: [place];  Service: Endoscopy;  Laterality: N/A;  . HERNIA REPAIR    . INTRAOPERATIVE TRANSESOPHAGEAL ECHOCARDIOGRAM N/A 03/31/2014   Procedure: INTRAOPERATIVE TRANSESOPHAGEAL  ECHOCARDIOGRAM;  Surgeon: Melrose Nakayama, MD;  Location: [place];  Service: Open Heart Surgery;  Laterality: N/A;  . IR ANGIOGRAM SELECTIVE EACH ADDITIONAL VESSEL  06/16/2020  . IR ANGIOGRAM SELECTIVE EACH ADDITIONAL VESSEL  06/16/2020  . IR ANGIOGRAM SELECTIVE EACH ADDITIONAL VESSEL  06/16/2020  . IR ANGIOGRAM SELECTIVE EACH ADDITIONAL VESSEL  06/16/2020  . IR ANGIOGRAM SELECTIVE EACH ADDITIONAL VESSEL  06/16/2020  . IR ANGIOGRAM SELECTIVE EACH ADDITIONAL VESSEL  06/16/2020  . IR ANGIOGRAM SELECTIVE EACH ADDITIONAL VESSEL  06/16/2020  . IR ANGIOGRAM SELECTIVE EACH ADDITIONAL VESSEL  06/16/2020  . IR ANGIOGRAM SELECTIVE EACH ADDITIONAL VESSEL  06/16/2020  . IR ANGIOGRAM SELECTIVE EACH ADDITIONAL VESSEL  06/30/2020  . IR ANGIOGRAM SELECTIVE EACH ADDITIONAL VESSEL  06/30/2020  . IR ANGIOGRAM VISCERAL SELECTIVE  06/16/2020  . IR ANGIOGRAM VISCERAL SELECTIVE  06/30/2020  . IR EMBO ARTERIAL NOT HEMORR HEMANG INC GUIDE ROADMAPPING  06/16/2020  . IR EMBO TUMOR ORGAN ISCHEMIA INFARCT INC GUIDE ROADMAPPING  06/30/2020  . IR RADIOLOGIST EVAL & MGMT  05/06/2020  .  IR RADIOLOGIST EVAL & MGMT  05/20/2020  . IR US GUIDE VASC ACCESS RIGHT  06/16/2020  . IR US GUIDE VASC ACCESS RIGHT  06/30/2020  . LAPAROSCOPIC UMBILICAL HERNIA REPAIR W/ MESH  06-06-2011  . LEFT HEART CATHETERIZATION WITH CORONARY ANGIOGRAM N/A 03/28/2014   Procedure: LEFT HEART CATHETERIZATION WITH CORONARY ANGIOGRAM;  Surgeon: Sinclair Grooms, MD;  Location: [facility] CATH LAB;  Service: Cardiovascular;  Laterality: N/A;  . LIPOMA EXCISION Left 08/25/2017   Procedure: EXCISION LIPOMA LEFT POSTERIOR THIGH;  Surgeon: Clovis Riley, MD;  Location: [place];  Service: General;  Laterality: Left;  . NEPHROLITHOTOMY  1990'S  . OPEN APPENDECTOMY W/ PARTIAL CECECTOMY  05-16-2004  . PERCUTANEOUS CORONARY STENT INTERVENTION (PCI-S) N/A 06/28/2012   Procedure: PERCUTANEOUS CORONARY STENT INTERVENTION (PCI-S);  Surgeon: Sherren Mocha, MD;  Location: [facility]  CATH LAB;  Service: Cardiovascular;  Laterality: N/A;       Family History  Problem Relation Age of Onset  . Lung cancer Sister   . Cancer Sister        lung  . Cancer Mother   . Cancer Father        died in his 72s.  . Cancer Brother        lung  . Coronary artery disease Other   . Diabetes Other   . Colon cancer Other   . Cancer Sister        lung    Social History   Tobacco Use  . Smoking status: Former Smoker    Packs/day: 2.00    Years: 40.00    Pack years: 80.00    Types: Cigarettes    Quit date: 08/29/1996    Years since quitting: 23.9  . Smokeless tobacco: Never Used  Vaping Use  . Vaping Use: Never used  Substance Use Topics  . Alcohol use: Yes    Alcohol/week: 8.0 standard drinks    Types: 8 Cans of beer per week    Comment: 2 beers every other day  . Drug use: No    Home Medications Prior to Admission medications   Medication Sig Start Date End Date Taking? Authorizing Provider  albuterol (VENTOLIN HFA) 108 (90 Base) MCG/ACT inhaler Inhale 2 puffs into the lungs every 6 (six) hours as needed for wheezing or shortness of breath. 03/12/20  Yes [name], MD  atorvastatin (LIPITOR) 40 MG tablet TAKE 1 TABLET BY MOUTH DAILY AT 6 PM Patient taking differently: Take by mouth daily. 05/13/20  Yes [name], MD  ferrous sulfate 325 (65 FE) MG EC tablet Take 1 tablet (325 mg total) by mouth daily with breakfast. 01/16/20  Yes [name], MD  gabapentin (NEURONTIN) 100 MG capsule TAKE 1 CAPSULE(100 MG) BY MOUTH TWICE DAILY Patient taking differently: Take 100 mg by mouth 2 (two) times daily. 08/03/20  Yes [name], MD  glimepiride (AMARYL) 2 MG tablet Take 2 mg by mouth daily. 07/13/20  Yes [provider]  metFORMIN (GLUCOPHAGE) 850 MG tablet TAKE 1 TABLET(850 MG) BY MOUTH TWICE DAILY WITH A MEAL Patient taking differently: Take 850 mg by mouth 2 (two) times daily with a meal. 06/16/20  Yes [name], MD  nitroGLYCERIN (NITROSTAT) 0.4 MG SL tablet Place 1 tablet (0.4 mg total) under the tongue every 5 (five) minutes as needed for chest pain. 06/08/16  Yes Nahser, Wonda Cheng, MD  Sennosides (EX-LAX PO) Take 1 tablet by mouth daily as needed (constipation).   Yes [provider]  torsemide (DEMADEX) 20 MG tablet Take 40 mg (2 pills) every morning and every afternoon except Mon, Wed, Fri afternoons take only 20 mg (1 pill) 02/06/20  Yes Nahser, Wonda Cheng, MD  umeclidinium-vilanterol (ANORO ELLIPTA) 62.5-25 MCG/INH AEPB Inhale 1 puff into the lungs daily. 03/12/20  Yes [name], MD  Accu-Chek Softclix Lancets lancets USE AS DIRECTED TO TEST BLOOD SUGAR FOUR TIMES DAILY 03/13/20   [name], MD  blood glucose meter kit and supplies Dispense based on patient and insurance preference. Use up to four times daily as directed. (FOR ICD-10 E10.9, E11.9). 03/12/20   [name], MD  pantoprazole (PROTONIX) 40 MG tablet Take 1 tablet (40 mg total) by mouth daily. Patient not taking: Reported on 08/09/2020 04/23/20   Pokhrel, Corrie Mckusick, MD  polyethylene glycol (MIRALAX / GLYCOLAX) 17 g packet Take 17 g by mouth daily as needed for moderate constipation. Patient not taking: Reported on 08/09/2020 04/23/20   Flora Lipps, MD  spironolactone (ALDACTONE) 25 MG tablet Take 1 tablet (25 mg total) by mouth daily. Patient not taking: Reported on 08/09/2020 01/04/20   Nita Sells, MD    Allergies    Patient has no known allergies.  Review of Systems   Review of Systems  Constitutional: Negative for chills and fever.  HENT: Negative for ear pain and sore throat.   Eyes: Negative for pain and visual disturbance.  Respiratory: Positive for cough and shortness of breath. Negative for sputum production.   Cardiovascular: Positive for chest pain (only when coughing) and leg swelling. Negative for palpitations, syncope and PND.  Gastrointestinal: Positive for blood in stool  (black stools). Negative for abdominal pain, nausea and vomiting.  Genitourinary: Negative for dysuria and hematuria.  Musculoskeletal: Negative for arthralgias and back pain.  Skin: Negative for color change and rash.  Neurological: Negative for seizures, syncope, weakness, light-headedness and numbness.  All other systems reviewed and are negative.   Physical Exam Updated Vital Signs BP (!) 146/54   Pulse 76   Temp 97.8 F (36.6 C) (Oral)   Resp 15   Ht '5\' 8"'  (1.727 m)   Wt 106.6 kg   SpO2 100%   BMI 35.73 kg/m   Physical Exam Vitals and nursing note reviewed. Chaperone present: Rectal exam chaperoned by Thurman Coyer, RN.  Constitutional:      Appearance: He is well-developed and well-nourished.  HENT:     Head: Normocephalic and atraumatic.  Eyes:     Conjunctiva/sclera: Conjunctivae normal.  Cardiovascular:     Rate and Rhythm: Normal rate and regular rhythm.     Heart sounds: No murmur heard.   Pulmonary:     Effort: Pulmonary effort is normal. No tachypnea, bradypnea or respiratory distress.     Breath sounds: Examination of the right-middle field reveals wheezing. Examination of the left-middle field reveals wheezing. Examination of the right-lower field reveals wheezing and rales. Examination of the left-lower field reveals wheezing and rales. Decreased breath sounds, wheezing and rales present.  Chest:     Chest wall: Tenderness (over left chest) present.  Abdominal:     Palpations: Abdomen is soft.     Tenderness: There is no abdominal tenderness.  Genitourinary:    Comments: No bright red blood, no obvious melena on rectal exam Musculoskeletal:     Cervical back: Neck supple.     Right lower leg: Edema present.     Left lower leg: Edema present.  Skin:    General: Skin is  warm and dry.  Neurological:     Mental Status: He is alert and oriented to person, place, and time.  Psychiatric:        Mood and Affect: Mood and affect normal.     ED Results /  Procedures / Treatments   Labs (all labs ordered are listed, but only abnormal results are displayed) Labs Reviewed  BASIC METABOLIC PANEL - Abnormal; Notable for the following components:      Result Value   BUN 30 (*)    Calcium 8.5 (*)    All other components within normal limits  CBC - Abnormal; Notable for the following components:   WBC 3.5 (*)    RBC 2.51 (*)    Hemoglobin 5.5 (*)    HCT 20.0 (*)    MCV 79.7 (*)    MCH 21.9 (*)    MCHC 27.5 (*)    RDW 18.1 (*)    Platelets 138 (*)    All other components within normal limits  BRAIN NATRIURETIC PEPTIDE - Abnormal; Notable for the following components:   B Natriuretic Peptide 326.3 (*)    All other components within normal limits  POC OCCULT BLOOD, ED - Abnormal; Notable for the following components:   Fecal Occult Bld POSITIVE (*)    All other components within normal limits  RESP PANEL BY RT-PCR (FLU A&B, COVID) ARPGX2  HEPATIC FUNCTION PANEL  HEMOGLOBIN U1L  BASIC METABOLIC PANEL  HEMOGLOBIN AND HEMATOCRIT, BLOOD  HEMOGLOBIN AND HEMATOCRIT, BLOOD  HEMOGLOBIN AND HEMATOCRIT, BLOOD  HEMOGLOBIN AND HEMATOCRIT, BLOOD  CBG MONITORING, ED  TYPE AND SCREEN  PREPARE RBC (CROSSMATCH)  TROPONIN I (HIGH SENSITIVITY)  TROPONIN I (HIGH SENSITIVITY)    EKG None  Radiology DG Chest 2 View  Result Date: 08/09/2020 CLINICAL DATA:  Shortness of breath with exertion. EXAM: CHEST - 2 VIEW COMPARISON:  04/15/2020 FINDINGS: Median sternotomy and prior CABG. Stable cardiomegaly. Unchanged mediastinal contours with aortic atherosclerosis. Pulmonary edema with slight increase from prior exam. Minimal blunting of the costophrenic angles consistent with small effusions. There is fluid in the fissures. No confluent consolidation. No pneumothorax. No acute osseous abnormalities are seen. IMPRESSION: Increased pulmonary edema from prior exam with small pleural effusions. Stable cardiomegaly. Aortic Atherosclerosis (ICD10-I70.0).  Electronically Signed   By: Keith Rake M.D.   On: 08/09/2020 17:24    Procedures Procedures (including critical care time)  Medications Ordered in ED Medications  pantoprazole (PROTONIX) 80 mg in sodium chloride 0.9 % 100 mL (0.8 mg/mL) infusion (has no administration in time range)  atorvastatin (LIPITOR) tablet 40 mg (has no administration in time range)  nitroGLYCERIN (NITROSTAT) SL tablet 0.4 mg (has no administration in time range)  senna (SENOKOT) tablet 8.6 mg (has no administration in time range)  gabapentin (NEURONTIN) capsule 100 mg (100 mg Oral Given 08/09/20 2304)  umeclidinium-vilanterol (ANORO ELLIPTA) 62.5-25 MCG/INH 1 puff (has no administration in time range)  insulin glargine (LANTUS) injection 10 Units (has no administration in time range)  insulin aspart (novoLOG) injection 0-20 Units (has no administration in time range)  albuterol (PROVENTIL) (2.5 MG/3ML) 0.083% nebulizer solution 2.5 mg (has no administration in time range)  furosemide (LASIX) injection 40 mg (has no administration in time range)  losartan (COZAAR) tablet 25 mg (has no administration in time range)  0.9 %  sodium chloride infusion (has no administration in time range)  acetaminophen (TYLENOL) tablet 650 mg (has no administration in time range)    Or  acetaminophen (TYLENOL) suppository 650  mg (has no administration in time range)  morphine 2 MG/ML injection 2 mg (has no administration in time range)  0.9 %  sodium chloride infusion (Manually program via Guardrails IV Fluids) ( Intravenous New Bag/Given 08/09/20 2107)  pantoprazole (PROTONIX) 80 mg in sodium chloride 0.9 % 100 mL IVPB (0 mg Intravenous Stopped 08/09/20 2101)  furosemide (LASIX) injection 40 mg (40 mg Intravenous Given 08/09/20 2027)  furosemide (LASIX) injection 40 mg (40 mg Intravenous Given 08/09/20 2303)    ED Course  I have reviewed the triage vital signs and the nursing notes.  Pertinent labs & imaging results that  were available during my care of the patient were reviewed by me and considered in my medical decision making (see chart for details).  Clinical Course as of 08/10/20 0126  Sun Aug 09, 2020  2101 Handoff given to Dr. Linda Hedges [CH]    Clinical Course User Index [CH] Darrick Huntsman, MD   MDM Rules/Calculators/A&P                          MDM: Rowan Blaker is a 74 y.o. male who presents with chest pain as per above. I have reviewed the nursing documentation for past medical history, family history, and social history. Pertinent previous records reviewed. He is awake, alert. HDS. Afebrile. Physical exam is most notable for 3+ bilateral pitting LE edema with edema extending into his lower abdomen.  Abdomen soft, nontender.  Rectal exam without gross melena but fecal occult positive.  Labs: Hemoglobin 5.5, fecal occult positive. EKG: NSR. QTc, PR, and QRS within appropriate limits. No signs of acute ischemia, infarct, or significant electrical abnormalities. No STEMI, ST depressions, or significant T wave inversions. No evidence of a High-Grade Conduction Block, WPW, Brugada Sign, ARVC, DeWinters T Waves, or Wellens Waves. Imaging: CXR demonstrating pulmonary edema with small pleural effusions Consults: Hospitalist for admission Tx: 2 units LR, 40 mg IV Lasix, Protonix bolus and infusion  Differential Dx: I am most concerned for GI bleeding and volume overload. Given history, physical exam, and work-up, I do not think he has ACS, PE, pneumothorax, pneumonia, aortic dissection/aneurysm, esophageal pathology, intracranial or intrathoracic bleeding, hematemesis, trauma, or variceal bleeding  MDM: BRENAN MODESTO is a 74 y.o. male presents with worsening shortness of breath and dyspnea on exertion found to be anemic to 5.5 with fecal occult positive for blood.  Shortness of breath is also likely exacerbated by underlying CHF with volume overload and pulmonary edema on chest x-ray.  Due to patient's GI  bleeding, given Protonix bolus and fusion started.  Dr. Alessandra Bevels with GI was messaged regarding the patient and request to see the patient in the morning.  Due to patient's volume overload, given 40 mg IV Lasix to start initial diuresis while receiving 2 units PRBC.  Handoff given to Dr. Linda Hedges, hospitalist.  Admitted in stable condition.  Patient updated on work-up, thought process, and plan for admission.  The plan for this patient was discussed with Dr. Darl Householder, who voiced agreement and who oversaw evaluation and treatment of this patient.   Final Clinical Impression(s) / ED Diagnoses Final diagnoses:  None    Rx / DC Orders ED Discharge Orders    None       Uriel Horkey, MD 08/10/20 0126    Drenda Freeze, MD 08/15/20 2011

## 2020-08-10 ENCOUNTER — Encounter (HOSPITAL_COMMUNITY)
Admission: EM | Disposition: A | Discharge status: Home Health | Payer: Self-pay | Source: Home / Self Care | Attending: Internal Medicine

## 2020-08-10 ENCOUNTER — Inpatient Hospital Stay (HOSPITAL_COMMUNITY): Payer: Medicare HMO

## 2020-08-10 ENCOUNTER — Encounter (HOSPITAL_COMMUNITY): Payer: Self-pay | Admitting: Internal Medicine

## 2020-08-10 ENCOUNTER — Inpatient Hospital Stay (HOSPITAL_COMMUNITY): Payer: Medicare HMO | Admitting: Anesthesiology

## 2020-08-10 ENCOUNTER — Other Ambulatory Visit (HOSPITAL_COMMUNITY): Payer: Medicare HMO

## 2020-08-10 DIAGNOSIS — Z7189 Other specified counseling: Secondary | ICD-10-CM

## 2020-08-10 DIAGNOSIS — E785 Hyperlipidemia, unspecified: Secondary | ICD-10-CM

## 2020-08-10 DIAGNOSIS — Z515 Encounter for palliative care: Secondary | ICD-10-CM

## 2020-08-10 DIAGNOSIS — K7031 Alcoholic cirrhosis of liver with ascites: Secondary | ICD-10-CM

## 2020-08-10 DIAGNOSIS — G4733 Obstructive sleep apnea (adult) (pediatric): Secondary | ICD-10-CM

## 2020-08-10 HISTORY — PX: ESOPHAGOGASTRODUODENOSCOPY (EGD) WITH PROPOFOL: SHX5813

## 2020-08-10 LAB — PROTIME-INR
INR: 1.2 (ref 0.8–1.2)
Prothrombin Time: 14.3 seconds (ref 11.4–15.2)

## 2020-08-10 LAB — HEMOGLOBIN A1C
Hgb A1c MFr Bld: 5.3 % (ref 4.8–5.6)
Mean Plasma Glucose: 105.41 mg/dL

## 2020-08-10 LAB — COMPREHENSIVE METABOLIC PANEL
ALT: 15 U/L (ref 0–44)
AST: 22 U/L (ref 15–41)
Albumin: 3.3 g/dL — ABNORMAL LOW (ref 3.5–5.0)
Alkaline Phosphatase: 106 U/L (ref 38–126)
Anion gap: 11 (ref 5–15)
BUN: 27 mg/dL — ABNORMAL HIGH (ref 8–23)
CO2: 26 mmol/L (ref 22–32)
Calcium: 8.6 mg/dL — ABNORMAL LOW (ref 8.9–10.3)
Chloride: 102 mmol/L (ref 98–111)
Creatinine, Ser: 1.25 mg/dL — ABNORMAL HIGH (ref 0.61–1.24)
GFR, Estimated: >60 mL/min (ref >60)
Glucose, Bld: 79 mg/dL (ref 70–99)
Potassium: 3.3 mmol/L — ABNORMAL LOW (ref 3.5–5.1)
Sodium: 139 mmol/L (ref 135–145)
Total Bilirubin: 1.4 mg/dL — ABNORMAL HIGH (ref 0.3–1.2)
Total Protein: 6.5 g/dL (ref 6.5–8.1)

## 2020-08-10 LAB — POCT I-STAT, CHEM 8
BUN: 31 mg/dL — ABNORMAL HIGH (ref 8–23)
Calcium, Ion: 1.07 mmol/L — ABNORMAL LOW (ref 1.15–1.40)
Chloride: 101 mmol/L (ref 98–111)
Creatinine, Ser: 1.2 mg/dL (ref 0.61–1.24)
Glucose, Bld: 87 mg/dL (ref 70–99)
HCT: 26 % — ABNORMAL LOW (ref 39.0–52.0)
Hemoglobin: 8.8 g/dL — ABNORMAL LOW (ref 13.0–17.0)
Potassium: 3.6 mmol/L (ref 3.5–5.1)
Sodium: 141 mmol/L (ref 135–145)
TCO2: 29 mmol/L (ref 22–32)

## 2020-08-10 LAB — CBG MONITORING, ED
Glucose-Capillary: 106 mg/dL — ABNORMAL HIGH (ref 70–99)
Glucose-Capillary: 75 mg/dL (ref 70–99)

## 2020-08-10 LAB — HEMOGLOBIN AND HEMATOCRIT, BLOOD
HCT: 23.5 % — ABNORMAL LOW (ref 39.0–52.0)
HCT: 28.6 % — ABNORMAL LOW (ref 39.0–52.0)
HCT: 26.9 % — ABNORMAL LOW (ref 39.0–52.0)
Hemoglobin: 6.3 g/dL — CL (ref 13.0–17.0)
Hemoglobin: 8.5 g/dL — ABNORMAL LOW (ref 13.0–17.0)
Hemoglobin: 7.6 g/dL — ABNORMAL LOW (ref 13.0–17.0)

## 2020-08-10 LAB — GLUCOSE, CAPILLARY
Glucose-Capillary: 91 mg/dL (ref 70–99)
Glucose-Capillary: 160 mg/dL — ABNORMAL HIGH (ref 70–99)

## 2020-08-10 LAB — PREPARE RBC (CROSSMATCH)

## 2020-08-10 SURGERY — ESOPHAGOGASTRODUODENOSCOPY (EGD) WITH PROPOFOL
Anesthesia: Monitor Anesthesia Care

## 2020-08-10 MED ORDER — OCTREOTIDE LOAD VIA INFUSION
50.0000 ug | Freq: Once | INTRAVENOUS | Status: AC
  Administered 2020-08-10: 50 ug via INTRAVENOUS
  Filled 2020-08-10: qty 25

## 2020-08-10 MED ORDER — SODIUM CHLORIDE 0.9% IV SOLUTION: Freq: Once | INTRAVENOUS | Status: DC

## 2020-08-10 MED ORDER — PROPOFOL 10 MG/ML IV BOLUS
INTRAVENOUS | Status: DC | PRN
  Administered 2020-08-10: 30 mg via INTRAVENOUS

## 2020-08-10 MED ORDER — LIDOCAINE 2% (20 MG/ML) 5 ML SYRINGE
INTRAMUSCULAR | Status: DC | PRN
  Administered 2020-08-10: 60 mg via INTRAVENOUS

## 2020-08-10 MED ORDER — LACTATED RINGERS IV SOLN: INTRAVENOUS | Status: DC | PRN

## 2020-08-10 MED ORDER — SODIUM CHLORIDE 0.9 % IV SOLN: INTRAVENOUS | Status: DC

## 2020-08-10 MED ORDER — IPRATROPIUM-ALBUTEROL 0.5-2.5 (3) MG/3ML IN SOLN
3.0000 mL | Freq: Four times a day (QID) | RESPIRATORY_TRACT | Status: DC
  Administered 2020-08-10 – 2020-08-11 (×2): 3 mL via RESPIRATORY_TRACT
  Filled 2020-08-10 (×2): qty 3

## 2020-08-10 MED ORDER — SODIUM CHLORIDE 0.9 % IV SOLN
50.0000 ug/h | INTRAVENOUS | Status: DC
  Administered 2020-08-10 – 2020-08-14 (×8): 50 ug/h via INTRAVENOUS
  Filled 2020-08-10 (×12): qty 1

## 2020-08-10 MED ORDER — PROPOFOL 500 MG/50ML IV EMUL
INTRAVENOUS | Status: DC | PRN
  Administered 2020-08-10: 75 ug/kg/min via INTRAVENOUS

## 2020-08-10 SURGICAL SUPPLY — 15 items

## 2020-08-10 NOTE — Anesthesia Procedure Notes (Signed)
Procedure Name: MAC Date/Time: 08/10/2020 1:50 PM Performed by: Trinna Post., CRNA Pre-anesthesia Checklist: Patient identified, Emergency Drugs available, Suction available, Patient being monitored and Timeout performed Patient Re-evaluated:Patient Re-evaluated prior to induction Oxygen Delivery Method: Nasal cannula Preoxygenation: Pre-oxygenation with 100% oxygen Induction Type: IV induction Placement Confirmation: positive ETCO2

## 2020-08-10 NOTE — Consult Note (Signed)
Consultation Note Date: 08/10/2020   Patient Name: [name]  DOB: [birth year]  MRN: [id number]  Age / Sex: 74 y.o., male  PCP: [name], MD Referring Physician: Alma Friendly, MD  Reason for Consultation: Establishing goals of care  HPI/Patient Profile: 75 y.o. male  with past medical history of liver cirrhosis with known varices, hepatocellular carcinoma, CAD s/p CABG, COPD, HFpEF, and diabetes mellitus type 2. He presented to the emergency department on 08/09/2020 with shortness of breath, increased peripheral edema, and black stools.  ED Course: Vital signs stable. Labs revealed creatinine 1.23, BNP 326.3, hemoglobin 5.5 (down from baseline around 8). He was given IV lasix, transfused 2 units PRBCs, and started on IV protonix infusion. He was admitted to [facility] for management of GI bleed and acute on chronic HFpEF.   EGD done 12/13 showed grade 1 esophageal varices, portal hypertensive gastropathy, friable gastric mucosa, and type 1 isolated gastric varices without bleeding.   Patient initially diagnosed with right hepatocellular carcinoma in September 2021 (confirmed with contrast MRI); his BCLC is intermediate stage. He is followed by interventional radiology and has been receiving radioembolization treatment.     Clinical Assessment and Goals of Care: I have reviewed medical records including EPIC notes, labs and imaging, received report from nursing, and met at bedside with patient to discuss diagnosis, prognosis, GOC, EOL wishes, disposition, and options.  I introduced Palliative Medicine as specialized medical care for people living with serious illness. It focuses on providing relief from the symptoms and stress of a serious illness.   We discussed a brief life review of the patient. He is originally from [place]. He married his wife [place] in 1969. They have 4 children. There  are 2 daughters - Hassan Rowan [facility]) and Vickii Chafe [facility]). His older son Karma Greaser is in a federal prison in [place]. His youngest son [name] lives with him and [place]. [name] helps him with his medications, medical appointments, and shopping.   As far as functional status, patient states he is "doing ok" and does not feel there has been a decline. He reports that he continues to be quite active around the house and outside. He continues to enjoy fishing. He is looking forward to celebrating Christmas with his family.    We discussed his current illness and what it means in the larger context of his ongoing co-morbidities.  Natural disease trajectory of cirrhosis was discussed. Patient shares that his father died of liver cirrhosis. When we discussed his diagnosis of heptatocellular carcinoma, patient states he was told "they got it all" and has the understanding the treatment was curative.   I attempted to elicit values and goals of care important to the patient. He wants to remain at home and able to take care of himself for as long as possible. He wants to be around for his family; he especially wants to be able to see his son Dwayne released from prison.     The difference between aggressive medical intervention and comfort care was considered in  light of the patient's goals of care. The patient is interested in all medical interventions offered. We did discuss code status. Encouraged patient to consider DNR/DNI status understanding evidenced based poor outcomes in similar hospitalized patients, as the cause of the arrest is likely associated with chronic/terminal disease rather than a reversible acute cardio-pulmonary event. Patient initially states he would want to be resuscitated, but then states "when it's my time, I'm ready to go". He shares that his mother was resuscitated and placed on life support, and he would not want this.   Questions and concerns were addressed. I gave patient my card, and  encouraged him to call with questions or concerns.   Primary decision maker: Patient. Surrogate decision maker is [employer]. Patient would want the alternate to be his son [name].     SUMMARY OF RECOMMENDATIONS   - code status changed to DNR/DNI - continue current medical treatment - goal of care is to go home and return to previous functional status - patient may need additional discussions regarding diagnosis/prognosis of [place] - spiritual care consult placed to assist with advanced directive documents - PMT will continue to follow  Code Status/Advance Care Planning:  DNR  Symptom Management:   Per primary team  Additional Recommendations (Limitations, Scope, Preferences):  Full Scope Treatment  Psycho-social/Spiritual:   Created space and opportunity for patient to express thoughts and feelings regarding patient's current medical situation.   Emotional support provided   Prognosis:   Unable to determine  Discharge Planning: Home      Primary Diagnoses: Present on Admission: . Type 2 diabetes with complication ([place]) . OSA (obstructive sleep apnea) . Hyperlipidemia with target LDL less than 70 . GI bleed . CKD (chronic kidney disease), stage III ([place]) . (HFpEF) heart failure with preserved ejection fraction ([place]) . Hepatocellular carcinoma ([place])   I have reviewed the medical record, interviewed the patient and family, and examined the patient. The following aspects are pertinent.  Past Medical History:  Diagnosis Date  . Arthritis    "joints tighten up sometimes" (03/27/2104)  . Carcinoma of liver, hepatocellular ([place]) 06/30/2020  . CHF (congestive heart failure) ([place])   . Chronic lower back pain   . Coronary artery disease    a. 05/2012 Cath/PCI: LM 40ost, 50-60d, LAD 99p ruptured plaque (3.0x28 DES), LCX 50p/m, RCA 30-40p, 27m EF 65-70%.  . Coronary artery disease involving native coronary artery of native heart without angina pectoris    Severe left  main disease at catheterization July 2015  CABG x3 with a LIMA to the LAD, SVG to the OM, SVG to the PDA on 03/31/14. EF 60% by cath.   . Diastolic heart failure ([place]   . GERD without esophagitis 08/04/2010  . Hepatic cirrhosis ([facility]    a. Dx 01/2014 - CT a/p   . History of blood transfusion    "related to bleeding ulcers"  . History of concussion    1976--  NO RESIDUAL  . History of GI bleed    a. UGIB 07/2012;  b. 01/2014 admission with GIB/FOB stool req 1U prbc's->EGD showed portal gastropathy, barrett's esoph, and chronic active h. pylori gastritis.  .Marland KitchenHistory of gout    2007 &  2008  LEFT LEG-- NO ISSUE SINCE  . Hyperlipidemia   . Iron deficiency anemia   . Kidney stones   . OA (osteoarthritis of spine)    LOWER BACK--  INTERMITTANT LEFT LEG NUMBNESS  . OSA (obstructive sleep apnea)    PULMOLOGIST-  DR CLANCE--  MODERATE OSA  STARTED CPAP 2012--  BUT CURRENTLY HAS NOT USED PAST 6 MONTHS  . Phimosis    a. s/p circumcision 2015.  . Type 2 diabetes mellitus ([place])   . Unspecified essential hypertension     Family History  Problem Relation Age of Onset  . Lung cancer Sister   . Cancer Sister        lung  . Cancer Mother   . Cancer Father        died in his 44s.  . Cancer Brother        lung  . Coronary artery disease Other   . Diabetes Other   . Colon cancer Other   . Cancer Sister        lung   Scheduled Meds: . sodium chloride   Intravenous Once  . atorvastatin  40 mg Oral Daily  . furosemide  40 mg Intravenous Q6H  . gabapentin  100 mg Oral BID  . insulin aspart  0-20 Units Subcutaneous TID WC  . insulin glargine  10 Units Subcutaneous QHS  . ipratropium-albuterol  3 mL Nebulization Q6H  . umeclidinium-vilanterol  1 puff Inhalation Daily   Continuous Infusions: . sodium chloride Stopped (08/10/20 0249)  . octreotide  (SANDOSTATIN)    IV infusion 50 mcg/hr (08/10/20 1227)  . pantoprozole (PROTONIX) infusion 8 mg/hr (08/10/20 1233)   PRN Meds:.acetaminophen  **OR** acetaminophen, albuterol, morphine injection, nitroGLYCERIN, senna Medications Prior to Admission:  Prior to Admission medications   Medication Sig Start Date End Date Taking? Authorizing Provider  albuterol (VENTOLIN HFA) 108 (90 Base) MCG/ACT inhaler Inhale 2 puffs into the lungs every 6 (six) hours as needed for wheezing or shortness of breath. 03/12/20  Yes [name], MD  atorvastatin (LIPITOR) 40 MG tablet TAKE 1 TABLET BY MOUTH DAILY AT 6 PM Patient taking differently: Take by mouth daily. 05/13/20  Yes [name], MD  ferrous sulfate 325 (65 FE) MG EC tablet Take 1 tablet (325 mg total) by mouth daily with breakfast. 01/16/20  Yes [name], MD  gabapentin (NEURONTIN) 100 MG capsule TAKE 1 CAPSULE(100 MG) BY MOUTH TWICE DAILY Patient taking differently: Take 100 mg by mouth 2 (two) times daily. 08/03/20  Yes [name], MD  glimepiride (AMARYL) 2 MG tablet Take 2 mg by mouth daily. 07/13/20  Yes [provider]  metFORMIN (GLUCOPHAGE) 850 MG tablet TAKE 1 TABLET(850 MG) BY MOUTH TWICE DAILY WITH A MEAL Patient taking differently: Take 850 mg by mouth 2 (two) times daily with a meal. 06/16/20  Yes [name], MD  nitroGLYCERIN (NITROSTAT) 0.4 MG SL tablet Place 1 tablet (0.4 mg total) under the tongue every 5 (five) minutes as needed for chest pain. 06/08/16  Yes Nahser, Wonda Cheng, MD  Sennosides (EX-LAX PO) Take 1 tablet by mouth daily as needed (constipation).   Yes [provider]  torsemide (DEMADEX) 20 MG tablet Take 40 mg (2 pills) every morning and every afternoon except Mon, Wed, Fri afternoons take only 20 mg (1 pill) 02/06/20  Yes Nahser, Wonda Cheng, MD  umeclidinium-vilanterol (ANORO ELLIPTA) 62.5-25 MCG/INH AEPB Inhale 1 puff into the lungs daily. 03/12/20  Yes [name], MD  Accu-Chek Softclix Lancets lancets USE AS DIRECTED TO TEST BLOOD SUGAR FOUR TIMES DAILY 03/13/20   [name], MD   blood glucose meter kit and supplies Dispense based on patient and insurance preference. Use up to four times daily as directed. (FOR  ICD-10 E10.9, E11.9). 03/12/20   [name], MD  pantoprazole (PROTONIX) 40 MG tablet Take 1 tablet (40 mg total) by mouth daily. Patient not taking: Reported on 08/09/2020 04/23/20   Pokhrel, Corrie Mckusick, MD  polyethylene glycol (MIRALAX / GLYCOLAX) 17 g packet Take 17 g by mouth daily as needed for moderate constipation. Patient not taking: Reported on 08/09/2020 04/23/20   Flora Lipps, MD  spironolactone (ALDACTONE) 25 MG tablet Take 1 tablet (25 mg total) by mouth daily. Patient not taking: Reported on 08/09/2020 01/04/20   Nita Sells, MD   No Known Allergies Review of Systems  Respiratory: Negative for shortness of breath.   Cardiovascular: Positive for leg swelling.    Physical Exam Vitals reviewed.  Constitutional:      General: He is not in acute distress. HENT:     Head: Normocephalic and atraumatic.  Cardiovascular:     Rate and Rhythm: Normal rate.     Comments: Sinus arrhythmia  Pulmonary:     Effort: Pulmonary effort is normal.  Musculoskeletal:     Right lower leg: Edema present.     Left lower leg: Edema present.  Neurological:     Mental Status: He is alert and oriented to person, place, and time.  Psychiatric:        Behavior: Behavior normal.     Vital Signs: BP (!) 121/95   Pulse 78   Temp 98 F (36.7 C) (Oral)   Resp (!) 21   Ht '5\' 8"'  (1.727 m)   Wt 106.6 kg   SpO2 100%   BMI 35.73 kg/m  Pain Scale: 0-10   Pain Score: 0-No pain   SpO2: SpO2: 100 % O2 Device:SpO2: 100 % O2 Flow Rate: .O2 Flow Rate (L/min): 2 L/min  IO: Intake/output summary:   Intake/Output Summary (Last 24 hours) at 08/10/2020 1911 Last data filed at 08/10/2020 1604 Gross per 24 hour  Intake 1151.28 ml  Output 3300 ml  Net -2148.72 ml    LBM:   Baseline Weight: Weight: 106.6 kg Most recent weight: Weight: 106.6 kg       Palliative Assessment/Data: PPS 60%     Time In: 18:00 Time Out: 19:12 Time Total: 72 minutes Greater than 50%  of this time was spent counseling and coordinating care related to the above assessment and plan.  Signed by: Lavena Bullion, NP   Please contact Palliative Medicine Team phone at [phone] for questions and concerns.  For individual provider: See Shea Evans

## 2020-08-10 NOTE — Progress Notes (Signed)
PROGRESS NOTE  [name] ZSW:[id number] DOB: 1945-09-29 DOA: 08/09/2020 PCP: [name], MD  HPI/Recap of past 24 hours: HPI from [name] is a 74 y.o. male with medical history significant of CAD s/p CABG, COPD,  HFpEF, Cirrhosis of the liver with known varices, h/o GI bleeding, hepatocellular carcinoma developed acute SOB/DOE at home. He also had increased peripheral edema. He reports having black stools with epigastric pain for about a couple of days.  Denies any hematochezia, vomiting, fever/chills.  Due to his acute symptoms he presents to [facility] for evaluation.  Quit drinking alcohol after his last hospitalization in August 2021.  Denies any NSAID use.  Due to chemo embolization for his [name] last month.  In the ED, vital signs somewhat stable, noted to have significant anasarca. Lab revelaed Cr 1.23, BNP 326.3, Trop #1 13, #2 13, Hgb 5.5 down from baseline of approximately 8. CXR reveals increased pulmonary edema. In the ED he was given IV lasix 40 mg, given 2 units of PRBC, IV PPI. GI consulted.  Patient admitted for further management.    Today, patient noted to be tachypneic, still short of breath, noted wheezing, denies any chest pain, reports some epigastric tenderness, denies any hematochezia, nausea/vomiting, fever/chills.  Noted productive cough.    Assessment/Plan: Active Problems:   Type 2 diabetes with complication (HCC)   OSA (obstructive sleep apnea)   Hyperlipidemia with target LDL less than 70   GI bleed   CKD (chronic kidney disease), stage III (HCC)   (HFpEF) heart failure with preserved ejection fraction (HCC)   Hepatocellular carcinoma (HCC)   Likely upper GI bleed History of gastric varices FOBT positive, with melanotic stools Hemoglobin dropped to 5.5, baseline around 8 Last EGD in August/21 showed gastric varices, nonbleeding Continue PPI infusion, octreotide drip N.p.o., supportive care GI consulted, plan for EGD on  08/10/2020 Monitor closely  Acute on chronic blood loss anemia Likely 2/2 above Hemoglobin 5.5 on admission, baseline around 8 S/p 2 units of PRBC--> 6.3, will transfuse another unit Serial H&H, monitor closely  Decompensated alcoholic liver cirrhosis Noted to have an anasarca, oriented LFTs WNL, T bili 1.4, will trend INR pending Will order RUQ ultrasound Continue IV Lasix Hold home torsemide, Aldactone for now as currently n.p.o.  Acute on chronic diastolic HF Noted anasarca BNP 326 Chest x-ray showed increased pulmonary edema Troponin WNL, EKG with no acute ST changes Last echo done on 04/2020 showed EF of 60 to 65% with grade 3 diastolic dysfunction Continue IV Lasix, hold home losartan for now Strict I's and O's, daily weights  History of asthma Noted to be wheezing, most likely from decompensated HF DuoNeb, continue inhaler Supplemental O2 as needed  CKD stage II Creatinine stable at baseline Daily BMP  Diabetes mellitus type 2 Last A1c 5.3% Continue SSI, Accu-Cheks, Lantus, hypoglycemic protocol Hold home regimen  Hypertension BP stable Hold home losartan  Hepatocellular carcinoma Patient currently follows with IR for embolization Continue outpatient follow-up  OSA Noncompliant with CPAP  Obesity Lifestyle modification advised  [place] Patient with multiple comorbidities, overall poor prognosis Palliative consulted        Malnutrition Type:      Malnutrition Characteristics:      Nutrition Interventions:       Estimated body mass index is 35.73 kg/m as calculated from the following:   Height as of this encounter: 5\' 8"  (1.727 m).   Weight as of this encounter: 106.6 kg.  Code Status: Full code  Family Communication: None at bedside  Disposition Plan: Status is: Inpatient  Remains inpatient appropriate because:Inpatient level of care appropriate due to severity of illness   Dispo: The patient is from: Home               Anticipated d/c is to: Home              Anticipated d/c date is: 2 days              Patient currently is not medically stable to d/c.     Consultants:  GI  Procedures:  None  Antimicrobials:  None  DVT prophylaxis: SCDs   Objective: Vitals:   08/10/20 0800 08/10/20 0900 08/10/20 0935 08/10/20 0945  BP: (!) 113/52 (!) 128/50 (!) 149/66 (!) 146/89  Pulse: 70 75 75 72  Resp: 14 17 17 17   Temp:  97.7 F (36.5 C) 97.9 F (36.6 C)   TempSrc:  Oral Oral   SpO2: 100% 100% 99% 99%  Weight:      Height:        Intake/Output Summary (Last 24 hours) at 08/10/2020 1019 Last data filed at 08/10/2020 0630 Gross per 24 hour  Intake 630 ml  Output 2550 ml  Net -1920 ml   Filed Weights   08/09/20 1944  Weight: 106.6 kg    Exam:  General:  Noted to be in distress, acutely ill-appearing, tired  Cardiovascular: S1, S2 present  Respiratory: Diffuse Rales noted bilaterally, noted bilateral wheezing  Abdomen: Soft, nontender, +distended, bowel sounds present  Musculoskeletal: 3+ bilateral pedal edema noted  Skin:  Bilateral lower extremity chronic venous stasis changes noted  Psychiatry: Normal mood   Data Reviewed: CBC: Recent Labs  Lab 08/09/20 1733 08/10/20 0605  WBC 3.5*  --   HGB 5.5* 6.3*  HCT 20.0* 23.5*  MCV 79.7*  --   PLT 138*  --    Basic Metabolic Panel: Recent Labs  Lab 08/09/20 1733 08/10/20 0548  NA 136 139  K 3.6 3.3*  CL 102 102  CO2 23 26  GLUCOSE 94 79  BUN 30* 27*  CREATININE 1.23 1.25*  CALCIUM 8.5* 8.6*   GFR: Estimated Creatinine Clearance: 61.4 mL/min (A) (by C-G formula based on SCr of 1.25 mg/dL (H)). Liver Function Tests: Recent Labs  Lab 08/10/20 0548  AST 22  ALT 15  ALKPHOS 106  BILITOT 1.4*  PROT 6.5  ALBUMIN 3.3*   No results for input(s): LIPASE, AMYLASE in the last 168 hours. No results for input(s): AMMONIA in the last 168 hours. Coagulation Profile: No results for input(s): INR, PROTIME in the  last 168 hours. Cardiac Enzymes: No results for input(s): CKTOTAL, CKMB, CKMBINDEX, TROPONINI in the last 168 hours. BNP (last 3 results) No results for input(s): PROBNP in the last 8760 hours. HbA1C: Recent Labs    08/10/20 0609  HGBA1C 5.3   CBG: Recent Labs  Lab 08/09/20 2302 08/10/20 0721  GLUCAP 96 106*   Lipid Profile: No results for input(s): CHOL, HDL, LDLCALC, TRIG, CHOLHDL, LDLDIRECT in the last 72 hours. Thyroid Function Tests: No results for input(s): TSH, T4TOTAL, FREET4, T3FREE, THYROIDAB in the last 72 hours. Anemia Panel: No results for input(s): VITAMINB12, FOLATE, FERRITIN, TIBC, IRON, RETICCTPCT in the last 72 hours. Urine analysis:    Component Value Date/Time   COLORURINE YELLOW 05/06/2020 [place] 05/06/2020 0954   LABSPEC 1.009 05/06/2020 [place] 7.0 05/06/2020 0954  GLUCOSEU NEGATIVE 05/06/2020 [place] 05/06/2020 [place] 07/29/2019 1849   KETONESUR NEGATIVE 05/06/2020 0954   PROTEINUR NEGATIVE 05/06/2020 0954   UROBILINOGEN 0.2 03/29/2014 1546   NITRITE NEGATIVE 05/06/2020 0954   LEUKOCYTESUR NEGATIVE 05/06/2020 0954   Sepsis Labs: @LABRCNTIP (procalcitonin:4,lacticidven:4)  ) Recent Results (from the past 240 hour(s))  Resp Panel by RT-PCR (Flu A&B, Covid) Nasopharyngeal Swab     Status: None   Collection Time: 08/09/20  7:47 PM   Specimen: Nasopharyngeal Swab; Nasopharyngeal(NP) swabs in vial transport medium  Result Value Ref Range Status   SARS Coronavirus 2 by RT PCR NEGATIVE NEGATIVE Final    Comment: (NOTE) SARS-CoV-2 target nucleic acids are NOT DETECTED.  The SARS-CoV-2 RNA is generally detectable in upper respiratory specimens during the acute phase of infection. The lowest concentration of SARS-CoV-2 viral copies this assay can detect is 138 copies/mL. A negative result does not preclude SARS-Cov-2 infection and should not be used as the sole basis for treatment  or other patient management decisions. A negative result may occur with  improper specimen collection/handling, submission of specimen other than nasopharyngeal swab, presence of viral mutation(s) within the areas targeted by this assay, and inadequate number of viral copies(<138 copies/mL). A negative result must be combined with clinical observations, patient history, and epidemiological information. The expected result is Negative.  Fact Sheet for Patients:  EntrepreneurPulse.com.au  Fact Sheet for Healthcare Providers:  IncredibleEmployment.be  This test is no t yet approved or cleared by the [place] FDA and  has been authorized for detection and/or diagnosis of SARS-CoV-2 by FDA under an Emergency Use Authorization (EUA). This EUA will remain  in effect (meaning this test can be used) for the duration of the COVID-19 declaration under Section 564(b)(1) of the Act, 21 U.S.C.section 360bbb-3(b)(1), unless the authorization is terminated  or revoked sooner.       Influenza A by PCR NEGATIVE NEGATIVE Final   Influenza B by PCR NEGATIVE NEGATIVE Final    Comment: (NOTE) The Xpert Xpress SARS-CoV-2/FLU/RSV plus assay is intended as an aid in the diagnosis of influenza from Nasopharyngeal swab specimens and should not be used as a sole basis for treatment. Nasal washings and aspirates are unacceptable for Xpert Xpress SARS-CoV-2/FLU/RSV testing.  Fact Sheet for Patients: EntrepreneurPulse.com.au  Fact Sheet for Healthcare Providers: IncredibleEmployment.be  This test is not yet approved or cleared by the [place] FDA and has been authorized for detection and/or diagnosis of SARS-CoV-2 by FDA under an Emergency Use Authorization (EUA). This EUA will remain in effect (meaning this test can be used) for the duration of the COVID-19 declaration under Section 564(b)(1) of the Act, 21 U.S.C. section  360bbb-3(b)(1), unless the authorization is terminated or revoked.  Performed at [facility], [place] [address], [place],  [address]       Studies: DG Chest 2 View  Result Date: 08/09/2020 CLINICAL DATA:  Shortness of breath with exertion. EXAM: CHEST - 2 VIEW COMPARISON:  04/15/2020 FINDINGS: Median sternotomy and prior CABG. Stable cardiomegaly. Unchanged mediastinal contours with aortic atherosclerosis. Pulmonary edema with slight increase from prior exam. Minimal blunting of the costophrenic angles consistent with small effusions. There is fluid in the fissures. No confluent consolidation. No pneumothorax. No acute osseous abnormalities are seen. IMPRESSION: Increased pulmonary edema from prior exam with small pleural effusions. Stable cardiomegaly. Aortic Atherosclerosis (ICD10-I70.0). Electronically Signed   By: Keith Rake M.D.   On: 08/09/2020 17:24    Scheduled  Meds: . sodium chloride   Intravenous Once  . atorvastatin  40 mg Oral Daily  . furosemide  40 mg Intravenous Q6H  . gabapentin  100 mg Oral BID  . insulin aspart  0-20 Units Subcutaneous TID WC  . insulin glargine  10 Units Subcutaneous QHS  . octreotide  50 mcg Intravenous Once  . umeclidinium-vilanterol  1 puff Inhalation Daily    Continuous Infusions: . sodium chloride Stopped (08/10/20 0249)  . octreotide  (SANDOSTATIN)    IV infusion    . pantoprozole (PROTONIX) infusion 8 mg/hr (08/10/20 0217)     LOS: 1 day     Alma Friendly, MD [facility]  If 7PM-7AM, please contact night-coverage [email or web address] 08/10/2020, 10:19 AM

## 2020-08-10 NOTE — ED Notes (Signed)
Attempted to call nursing report to 2 W

## 2020-08-10 NOTE — Interval H&P Note (Signed)
History and Physical Interval Note:  08/10/2020 1:48 PM  [name]  has presented today for surgery, with the diagnosis of gi bleed.  The various methods of treatment have been discussed with the patient and family. After consideration of risks, benefits and other options for treatment, the patient has consented to  Procedure(s): ESOPHAGOGASTRODUODENOSCOPY (EGD) WITH PROPOFOL (N/A) as a surgical intervention.  The patient's history has been reviewed, patient examined, no change in status, stable for surgery.  I have reviewed the patient's chart and labs.  Questions were answered to the patient's satisfaction.     Lear Ng

## 2020-08-10 NOTE — Anesthesia Preprocedure Evaluation (Addendum)
Anesthesia Evaluation  Patient identified by MRN, date of birth, ID band Patient awake    Reviewed: Allergy & Precautions, NPO status , Patient's Chart, lab work & pertinent test results  Airway Mallampati: III  TM Distance: >3 FB     Dental  (+) Edentulous Lower, Edentulous Upper   Pulmonary asthma , sleep apnea (non-compliant with CPAP) , former smoker,    breath sounds clear to auscultation       Cardiovascular hypertension, + CAD, + CABG (CABG x 3 2015) and +CHF (grade 3 diastolic dysfunction)  + Valvular Problems/Murmurs (mild AS) AS  Rhythm:Regular Rate:Normal  Echo 05/27/20: 1. Left ventricular ejection fraction, by estimation, is 60 to 65%. The  left ventricle has normal function. The left ventricle has no regional  wall motion abnormalities. Left ventricular diastolic parameters are  consistent with Grade III diastolic  dysfunction (restrictive).  2. Right ventricular systolic function is normal. The right ventricular  size is normal. There is mildly elevated pulmonary artery systolic  pressure. The estimated right ventricular systolic pressure is 81.8 mmHg.  3. Left atrial size was mildly dilated.  4. Right atrial size was mild to moderately dilated.  5. The mitral valve is normal in structure. No evidence of mitral valve  regurgitation. The mean mitral valve gradient is 3.0 mmHg with average  heart rate of 69 bpm. Moderate mitral annular calcification.  6. The aortic valve is tricuspid. There is moderate calcification of the  aortic valve. Aortic valve regurgitation is trivial. Mild aortic valve  stenosis. Aortic valve mean gradient measures 10.0 mmHg.  7. Aortic dilatation noted. There is mild dilatation of the ascending  aorta, measuring 42 mm.  8. The inferior vena cava is dilated in size with >50% respiratory  variability, suggesting right atrial pressure of 8 mmHg.    Neuro/Psych negative psych ROS    GI/Hepatic PUD, GERD  Controlled and Medicated,(+) Cirrhosis   Esophageal Varices and ascites    , Hepatocellular carcinoma s/p ablation in past, presented with GIB    Endo/Other  diabetes, Well Controlled, Type 2, Oral Hypoglycemic AgentsObesity BMI 36  Renal/GU CRFRenal diseaseCr 1.2  negative genitourinary   Musculoskeletal  (+) Arthritis , Osteoarthritis,    Abdominal (+) + obese,   Peds  Hematology  (+) Blood dyscrasia, anemia , hct 26 on istat in preop, has received multiple units since admission Hemodynamically stable   Anesthesia Other Findings   Reproductive/Obstetrics negative OB ROS                            Anesthesia Physical Anesthesia Plan  ASA: IV  Anesthesia Plan: MAC   Post-op Pain Management:    Induction: Intravenous  PONV Risk Score and Plan: Propofol infusion and Ondansetron  Airway Management Planned: Nasal Cannula and Natural Airway  Additional Equipment: None  Intra-op Plan:   Post-operative Plan:   Informed Consent: I have reviewed the patients History and Physical, chart, labs and discussed the procedure including the risks, benefits and alternatives for the proposed anesthesia with the patient or authorized representative who has indicated his/her understanding and acceptance.       Plan Discussed with: CRNA and Anesthesiologist  Anesthesia Plan Comments:        Anesthesia Quick Evaluation

## 2020-08-10 NOTE — H&P (View-Only) (Signed)
Referring Provider: Dr. Norins Primary Care Physician:  Crawford, Elizabeth A, MD Primary Gastroenterologist:  Dr. Karki  Reason for Consultation:  GI bleed  HPI: [name] is a 74 y.o. male with known cirrhosis and HCC presents with melena for 2 days and shortness of breath with severe anemia of Hgb 5.5. Reports epigastric pain of unknown duration. Denies vomiting, dizziness, or hematochezia. EGD in August 2021 showed nonbleeding gastric varices and mild portal hypertensive gastropathy. He reports heavy alcohol use until his hospitalization in August and reports alcohol abstinence since then. He is not able to tell me specifics on his black stools stating he thinks it has occurred for the past 2 days. S/P chemoembolization for his HCC last month. Denies NSAIDs.  Past Medical History:  Diagnosis Date  . Arthritis    "joints tighten up sometimes" (03/27/2104)  . Carcinoma of liver, hepatocellular (HCC) 06/30/2020  . CHF (congestive heart failure) (HCC)   . Chronic lower back pain   . Coronary artery disease    a. 05/2012 Cath/PCI: LM 40ost, 50-60d, LAD 99p ruptured plaque (3.0x28 DES), LCX 50p/m, RCA 30-40p, 50m, EF 65-70%.  . Coronary artery disease involving native coronary artery of native heart without angina pectoris    Severe left main disease at catheterization July 2015  CABG x3 with a LIMA to the LAD, SVG to the OM, SVG to the PDA on 03/31/14. EF 60% by cath.   . Diastolic heart failure (HCC)   . GERD without esophagitis 08/04/2010  . Hepatic cirrhosis (HCC)    a. Dx 01/2014 - CT a/p   . History of blood transfusion    "related to bleeding ulcers"  . History of concussion    1976--  NO RESIDUAL  . History of GI bleed    a. UGIB 07/2012;  b. 01/2014 admission with GIB/FOB stool req 1U prbc's->EGD showed portal gastropathy, barrett's esoph, and chronic active h. pylori gastritis.  . History of gout    2007 &  2008  LEFT LEG-- NO ISSUE SINCE  . Hyperlipidemia   . Iron deficiency  anemia   . Kidney stones   . OA (osteoarthritis of spine)    LOWER BACK--  INTERMITTANT LEFT LEG NUMBNESS  . OSA (obstructive sleep apnea)    PULMOLOGIST-  DR CLANCE--  MODERATE OSA  STARTED CPAP 2012--  BUT CURRENTLY HAS NOT USED PAST 6 MONTHS  . Phimosis    a. s/p circumcision 2015.  . Type 2 diabetes mellitus (HCC)   . Unspecified essential hypertension     Past Surgical History:  Procedure Laterality Date  . ANKLE FRACTURE SURGERY Right 1989   "plate put in"  . APPENDECTOMY  05-16-2004   open  . CIRCUMCISION N/A 09/09/2013   Procedure: CIRCUMCISION ADULT;  Surgeon: David S Grapey, MD;  Location: [facility];  Service: Urology;  Laterality: N/A;  . COLECTOMY  05-16-2004  . CORONARY ANGIOPLASTY WITH STENT PLACEMENT  06/28/2012  DR COOPER   PCI W/  X1 DES to PROX. LAD/  LM  40% OSTIAL & 50-60% DISTAL /  50% PROX LCX/  30-40% PROX RCA & 50% MID RCA/   LVEF 65-70%  . CORONARY ARTERY BYPASS GRAFT N/A 03/31/2014   Procedure: CORONARY ARTERY BYPASS GRAFTING (CABG) times 3 using left internal mammary artery and right saphenous vein.;  Surgeon: Steven C Hendrickson, MD;  Location: [facility] OR;  Service: Open Heart Surgery;  Laterality: N/A;  . DOBUTAMINE STRESS ECHO  06-08-2012   MODERATE HYPOKINESIS/   ISCHEMIA MID INFERIOR WALL  . ESOPHAGOGASTRODUODENOSCOPY  08/15/2012   Procedure: ESOPHAGOGASTRODUODENOSCOPY (EGD);  Surgeon: Wonda Horner, MD;  Location: [facility] ENDOSCOPY;  Service: Endoscopy;  Laterality: N/A;  . ESOPHAGOGASTRODUODENOSCOPY N/A 02/17/2014   Procedure: ESOPHAGOGASTRODUODENOSCOPY (EGD);  Surgeon: Jeryl Columbia, MD;  Location: [facility] ENDOSCOPY;  Service: Endoscopy;  Laterality: N/A;  . ESOPHAGOGASTRODUODENOSCOPY N/A 04/17/2020   Procedure: ESOPHAGOGASTRODUODENOSCOPY (EGD);  Surgeon: Ronnette Juniper, MD;  Location: [place];  Service: Gastroenterology;  Laterality: N/A;  . ESOPHAGOGASTRODUODENOSCOPY (EGD) WITH PROPOFOL N/A 09/30/2017   Procedure: ESOPHAGOGASTRODUODENOSCOPY (EGD) WITH  PROPOFOL;  Surgeon: Wonda Horner, MD;  Location: [facility] ENDOSCOPY;  Service: Endoscopy;  Laterality: N/A;  . ESOPHAGOGASTRODUODENOSCOPY (EGD) WITH PROPOFOL N/A 12/20/2017   Procedure: ESOPHAGOGASTRODUODENOSCOPY (EGD) WITH PROPOFOL;  Surgeon: Clarene Essex, MD;  Location: [place];  Service: Endoscopy;  Laterality: N/A;  . HERNIA REPAIR    . INTRAOPERATIVE TRANSESOPHAGEAL ECHOCARDIOGRAM N/A 03/31/2014   Procedure: INTRAOPERATIVE TRANSESOPHAGEAL ECHOCARDIOGRAM;  Surgeon: Melrose Nakayama, MD;  Location: [place];  Service: Open Heart Surgery;  Laterality: N/A;  . IR ANGIOGRAM SELECTIVE EACH ADDITIONAL VESSEL  06/16/2020  . IR ANGIOGRAM SELECTIVE EACH ADDITIONAL VESSEL  06/16/2020  . IR ANGIOGRAM SELECTIVE EACH ADDITIONAL VESSEL  06/16/2020  . IR ANGIOGRAM SELECTIVE EACH ADDITIONAL VESSEL  06/16/2020  . IR ANGIOGRAM SELECTIVE EACH ADDITIONAL VESSEL  06/16/2020  . IR ANGIOGRAM SELECTIVE EACH ADDITIONAL VESSEL  06/16/2020  . IR ANGIOGRAM SELECTIVE EACH ADDITIONAL VESSEL  06/16/2020  . IR ANGIOGRAM SELECTIVE EACH ADDITIONAL VESSEL  06/16/2020  . IR ANGIOGRAM SELECTIVE EACH ADDITIONAL VESSEL  06/16/2020  . IR ANGIOGRAM SELECTIVE EACH ADDITIONAL VESSEL  06/30/2020  . IR ANGIOGRAM SELECTIVE EACH ADDITIONAL VESSEL  06/30/2020  . IR ANGIOGRAM VISCERAL SELECTIVE  06/16/2020  . IR ANGIOGRAM VISCERAL SELECTIVE  06/30/2020  . IR EMBO ARTERIAL NOT HEMORR HEMANG INC GUIDE ROADMAPPING  06/16/2020  . IR EMBO TUMOR ORGAN ISCHEMIA INFARCT INC GUIDE ROADMAPPING  06/30/2020  . IR RADIOLOGIST EVAL & MGMT  05/06/2020  . IR RADIOLOGIST EVAL & MGMT  05/20/2020  . IR US GUIDE VASC ACCESS RIGHT  06/16/2020  . IR US GUIDE VASC ACCESS RIGHT  06/30/2020  . LAPAROSCOPIC UMBILICAL HERNIA REPAIR W/ MESH  06-06-2011  . LEFT HEART CATHETERIZATION WITH CORONARY ANGIOGRAM N/A 03/28/2014   Procedure: LEFT HEART CATHETERIZATION WITH CORONARY ANGIOGRAM;  Surgeon: Sinclair Grooms, MD;  Location: [facility] CATH LAB;  Service: Cardiovascular;   Laterality: N/A;  . LIPOMA EXCISION Left 08/25/2017   Procedure: EXCISION LIPOMA LEFT POSTERIOR THIGH;  Surgeon: Clovis Riley, MD;  Location: [place];  Service: General;  Laterality: Left;  . NEPHROLITHOTOMY  1990'S  . OPEN APPENDECTOMY W/ PARTIAL CECECTOMY  05-16-2004  . PERCUTANEOUS CORONARY STENT INTERVENTION (PCI-S) N/A 06/28/2012   Procedure: PERCUTANEOUS CORONARY STENT INTERVENTION (PCI-S);  Surgeon: Sherren Mocha, MD;  Location: [facility] CATH LAB;  Service: Cardiovascular;  Laterality: N/A;    Prior to Admission medications   Medication Sig Start Date End Date Taking? Authorizing Provider  albuterol (VENTOLIN HFA) 108 (90 Base) MCG/ACT inhaler Inhale 2 puffs into the lungs every 6 (six) hours as needed for wheezing or shortness of breath. 03/12/20  Yes [name], MD  atorvastatin (LIPITOR) 40 MG tablet TAKE 1 TABLET BY MOUTH DAILY AT 6 PM Patient taking differently: Take by mouth daily. 05/13/20  Yes [name], MD  ferrous sulfate 325 (65 FE) MG EC tablet Take 1 tablet (325 mg total) by mouth daily with breakfast. 01/16/20  Yes Sharlet Salina,  Elizabeth A, MD  gabapentin (NEURONTIN) 100 MG capsule TAKE 1 CAPSULE(100 MG) BY MOUTH TWICE DAILY Patient taking differently: Take 100 mg by mouth 2 (two) times daily. 08/03/20  Yes Crawford, Elizabeth A, MD  glimepiride (AMARYL) 2 MG tablet Take 2 mg by mouth daily. 07/13/20  Yes [provider]  metFORMIN (GLUCOPHAGE) 850 MG tablet TAKE 1 TABLET(850 MG) BY MOUTH TWICE DAILY WITH A MEAL Patient taking differently: Take 850 mg by mouth 2 (two) times daily with a meal. 06/16/20  Yes Crawford, Elizabeth A, MD  nitroGLYCERIN (NITROSTAT) 0.4 MG SL tablet Place 1 tablet (0.4 mg total) under the tongue every 5 (five) minutes as needed for chest pain. 06/08/16  Yes Nahser, Philip J, MD  Sennosides (EX-LAX PO) Take 1 tablet by mouth daily as needed (constipation).   Yes [provider]  torsemide (DEMADEX) 20 MG tablet Take  40 mg (2 pills) every morning and every afternoon except Mon, Wed, Fri afternoons take only 20 mg (1 pill) 02/06/20  Yes Nahser, Philip J, MD  umeclidinium-vilanterol (ANORO ELLIPTA) 62.5-25 MCG/INH AEPB Inhale 1 puff into the lungs daily. 03/12/20  Yes Crawford, Elizabeth A, MD  Accu-Chek Softclix Lancets lancets USE AS DIRECTED TO TEST BLOOD SUGAR FOUR TIMES DAILY 03/13/20   Crawford, Elizabeth A, MD  blood glucose meter kit and supplies Dispense based on patient and insurance preference. Use up to four times daily as directed. (FOR ICD-10 E10.9, E11.9). 03/12/20   Crawford, Elizabeth A, MD  pantoprazole (PROTONIX) 40 MG tablet Take 1 tablet (40 mg total) by mouth daily. Patient not taking: Reported on 08/09/2020 04/23/20   Pokhrel, Laxman, MD  polyethylene glycol (MIRALAX / GLYCOLAX) 17 g packet Take 17 g by mouth daily as needed for moderate constipation. Patient not taking: Reported on 08/09/2020 04/23/20   Pokhrel, Laxman, MD  spironolactone (ALDACTONE) 25 MG tablet Take 1 tablet (25 mg total) by mouth daily. Patient not taking: Reported on 08/09/2020 01/04/20   Samtani, Jai-Gurmukh, MD    Scheduled Meds: . sodium chloride   Intravenous Once  . atorvastatin  40 mg Oral Daily  . furosemide  40 mg Intravenous Q6H  . gabapentin  100 mg Oral BID  . insulin aspart  0-20 Units Subcutaneous TID WC  . insulin glargine  10 Units Subcutaneous QHS  . umeclidinium-vilanterol  1 puff Inhalation Daily   Continuous Infusions: . sodium chloride Stopped (08/10/20 0249)  . pantoprozole (PROTONIX) infusion 8 mg/hr (08/10/20 0217)   PRN Meds:.acetaminophen **OR** acetaminophen, albuterol, morphine injection, nitroGLYCERIN, senna  Allergies as of 08/09/2020  . (No Known Allergies)    Family History  Problem Relation Age of Onset  . Lung cancer Sister   . Cancer Sister        lung  . Cancer Mother   . Cancer Father        died in his 70s.  . Cancer Brother        lung  . Coronary artery disease  Other   . Diabetes Other   . Colon cancer Other   . Cancer Sister        lung    Social History   Socioeconomic History  . Marital status: Married    Spouse name: Not on file  . Number of children: Not on file  . Years of education: Not on file  . Highest education level: Not on file  Occupational History  . Not on file  Tobacco Use  . Smoking status: Former Smoker      Packs/day: 2.00    Years: 40.00    Pack years: 80.00    Types: Cigarettes    Quit date: 08/29/1996    Years since quitting: 23.9  . Smokeless tobacco: Never Used  Vaping Use  . Vaping Use: Never used  Substance and Sexual Activity  . Alcohol use: Yes    Alcohol/week: 8.0 standard drinks    Types: 8 Cans of beer per week    Comment: 2 beers every other day  . Drug use: No  . Sexual activity: Yes  Other Topics Concern  . Not on file  Social History Narrative   Lives in [place] with wife, son, and son's family.   Social Determinants of Health   Financial Resource Strain: Not on file  Food Insecurity: Not on file  Transportation Needs: Not on file  Physical Activity: Not on file  Stress: Not on file  Social Connections: Not on file  Intimate Partner Violence: Not on file    Review of Systems: All negative except as stated above in HPI.  Physical Exam: Vital signs: Vitals:   08/10/20 0715 08/10/20 0800  BP: (!) 145/69 (!) 113/52  Pulse: 73 70  Resp: 19 14  Temp:    SpO2: 100% 100%  T 98   General:   Lethargic, disheveled, obese, no acute distress  Head: normocephalic, atraumatic Eyes: anicteric sclera ENT: oropharynx clear Neck: supple, nontender Lungs:  Clear throughout to auscultation.   No wheezes, crackles, or rhonchi. No acute distress. Heart:  Regular rate and rhythm; no murmurs, clicks, rubs,  or gallops. Abdomen: epigastric tenderness with guarding, otherwise nontender, soft, nondistended, +BS  Rectal:  Deferred Ext: 3+ pitting LE edema with chronic stasis appearing skin  GI:  Lab  Results: Recent Labs    08/09/20 1733 08/10/20 0605  WBC 3.5*  --   HGB 5.5* 6.3*  HCT 20.0* 23.5*  PLT 138*  --    BMET Recent Labs    08/09/20 1733 08/10/20 0548  NA 136 139  K 3.6 3.3*  CL 102 102  CO2 23 26  GLUCOSE 94 79  BUN 30* 27*  CREATININE 1.23 1.25*  CALCIUM 8.5* 8.6*   LFT Recent Labs    08/10/20 0548  PROT 6.5  ALBUMIN 3.3*  AST 22  ALT 15  ALKPHOS 106  BILITOT 1.4*   PT/INR No results for input(s): LABPROT, INR in the last 72 hours.   Studies/Results: DG Chest 2 View  Result Date: 08/09/2020 CLINICAL DATA:  Shortness of breath with exertion. EXAM: CHEST - 2 VIEW COMPARISON:  04/15/2020 FINDINGS: Median sternotomy and prior CABG. Stable cardiomegaly. Unchanged mediastinal contours with aortic atherosclerosis. Pulmonary edema with slight increase from prior exam. Minimal blunting of the costophrenic angles consistent with small effusions. There is fluid in the fissures. No confluent consolidation. No pneumothorax. No acute osseous abnormalities are seen. IMPRESSION: Increased pulmonary edema from prior exam with small pleural effusions. Stable cardiomegaly. Aortic Atherosclerosis (ICD10-I70.0). Electronically Signed   By: Keith Rake M.D.   On: 08/09/2020 17:24    Impression/Plan: Decompensated alcoholic cirrhosis in the setting of hepatocellular carcinoma with melenic stools, shortness of breath. Hgb 5.5 on presentation and increase to 6.3 following 2 U PRBCs. No esophageal varices on EGD in August but he did have gastric varices. Doubt variceal bleed and suspect portal gastropathy vs peptic ulcer source. Check PT/INR. Add Octreotide drip. Continue Protonix drip. NPO. EGD today to further evaluate. Supportive care.    LOS: 1 day   C   08/10/2020, 8:45 AM  Questions please call [phone]  

## 2020-08-10 NOTE — ED Notes (Signed)
Pt transported to ENDO.

## 2020-08-10 NOTE — ED Notes (Signed)
US at bedside

## 2020-08-10 NOTE — Consult Note (Addendum)
Referring Provider: Dr. Norins Primary Care Physician:  Crawford, Elizabeth A, MD Primary Gastroenterologist:  Dr. Karki  Reason for Consultation:  GI bleed  HPI: [name] is a 74 y.o. male with known cirrhosis and HCC presents with melena for 2 days and shortness of breath with severe anemia of Hgb 5.5. Reports epigastric pain of unknown duration. Denies vomiting, dizziness, or hematochezia. EGD in August 2021 showed nonbleeding gastric varices and mild portal hypertensive gastropathy. He reports heavy alcohol use until his hospitalization in August and reports alcohol abstinence since then. He is not able to tell me specifics on his black stools stating he thinks it has occurred for the past 2 days. S/P chemoembolization for his HCC last month. Denies NSAIDs.  Past Medical History:  Diagnosis Date  . Arthritis    "joints tighten up sometimes" (03/27/2104)  . Carcinoma of liver, hepatocellular (HCC) 06/30/2020  . CHF (congestive heart failure) (HCC)   . Chronic lower back pain   . Coronary artery disease    a. 05/2012 Cath/PCI: LM 40ost, 50-60d, LAD 99p ruptured plaque (3.0x28 DES), LCX 50p/m, RCA 30-40p, 50m, EF 65-70%.  . Coronary artery disease involving native coronary artery of native heart without angina pectoris    Severe left main disease at catheterization July 2015  CABG x3 with a LIMA to the LAD, SVG to the OM, SVG to the PDA on 03/31/14. EF 60% by cath.   . Diastolic heart failure (HCC)   . GERD without esophagitis 08/04/2010  . Hepatic cirrhosis (HCC)    a. Dx 01/2014 - CT a/p   . History of blood transfusion    "related to bleeding ulcers"  . History of concussion    1976--  NO RESIDUAL  . History of GI bleed    a. UGIB 07/2012;  b. 01/2014 admission with GIB/FOB stool req 1U prbc's->EGD showed portal gastropathy, barrett's esoph, and chronic active h. pylori gastritis.  . History of gout    2007 &  2008  LEFT LEG-- NO ISSUE SINCE  . Hyperlipidemia   . Iron deficiency  anemia   . Kidney stones   . OA (osteoarthritis of spine)    LOWER BACK--  INTERMITTANT LEFT LEG NUMBNESS  . OSA (obstructive sleep apnea)    PULMOLOGIST-  DR CLANCE--  MODERATE OSA  STARTED CPAP 2012--  BUT CURRENTLY HAS NOT USED PAST 6 MONTHS  . Phimosis    a. s/p circumcision 2015.  . Type 2 diabetes mellitus (HCC)   . Unspecified essential hypertension     Past Surgical History:  Procedure Laterality Date  . ANKLE FRACTURE SURGERY Right 1989   "plate put in"  . APPENDECTOMY  05-16-2004   open  . CIRCUMCISION N/A 09/09/2013   Procedure: CIRCUMCISION ADULT;  Surgeon: David S Grapey, MD;  Location: [facility];  Service: Urology;  Laterality: N/A;  . COLECTOMY  05-16-2004  . CORONARY ANGIOPLASTY WITH STENT PLACEMENT  06/28/2012  DR COOPER   PCI W/  X1 DES to PROX. LAD/  LM  40% OSTIAL & 50-60% DISTAL /  50% PROX LCX/  30-40% PROX RCA & 50% MID RCA/   LVEF 65-70%  . CORONARY ARTERY BYPASS GRAFT N/A 03/31/2014   Procedure: CORONARY ARTERY BYPASS GRAFTING (CABG) times 3 using left internal mammary artery and right saphenous vein.;  Surgeon: Steven C Hendrickson, MD;  Location: [facility];  Service: Open Heart Surgery;  Laterality: N/A;  . DOBUTAMINE STRESS ECHO  06-08-2012   MODERATE HYPOKINESIS/   ISCHEMIA MID INFERIOR WALL  . ESOPHAGOGASTRODUODENOSCOPY  08/15/2012   Procedure: ESOPHAGOGASTRODUODENOSCOPY (EGD);  Surgeon: Salem F Ganem, MD;  Location: [facility];  Service: Endoscopy;  Laterality: N/A;  . ESOPHAGOGASTRODUODENOSCOPY N/A 02/17/2014   Procedure: ESOPHAGOGASTRODUODENOSCOPY (EGD);  Surgeon: Marc E Magod, MD;  Location: [facility];  Service: Endoscopy;  Laterality: N/A;  . ESOPHAGOGASTRODUODENOSCOPY N/A 04/17/2020   Procedure: ESOPHAGOGASTRODUODENOSCOPY (EGD);  Surgeon: Karki, Arya, MD;  Location: [facility];  Service: Gastroenterology;  Laterality: N/A;  . ESOPHAGOGASTRODUODENOSCOPY (EGD) WITH PROPOFOL N/A 09/30/2017   Procedure: ESOPHAGOGASTRODUODENOSCOPY (EGD) WITH  PROPOFOL;  Surgeon: Ganem, Salem F, MD;  Location: [facility];  Service: Endoscopy;  Laterality: N/A;  . ESOPHAGOGASTRODUODENOSCOPY (EGD) WITH PROPOFOL N/A 12/20/2017   Procedure: ESOPHAGOGASTRODUODENOSCOPY (EGD) WITH PROPOFOL;  Surgeon: Magod, Marc, MD;  Location: [facility];  Service: Endoscopy;  Laterality: N/A;  . HERNIA REPAIR    . INTRAOPERATIVE TRANSESOPHAGEAL ECHOCARDIOGRAM N/A 03/31/2014   Procedure: INTRAOPERATIVE TRANSESOPHAGEAL ECHOCARDIOGRAM;  Surgeon: Steven C Hendrickson, MD;  Location: [facility];  Service: Open Heart Surgery;  Laterality: N/A;  . IR ANGIOGRAM SELECTIVE EACH ADDITIONAL VESSEL  06/16/2020  . IR ANGIOGRAM SELECTIVE EACH ADDITIONAL VESSEL  06/16/2020  . IR ANGIOGRAM SELECTIVE EACH ADDITIONAL VESSEL  06/16/2020  . IR ANGIOGRAM SELECTIVE EACH ADDITIONAL VESSEL  06/16/2020  . IR ANGIOGRAM SELECTIVE EACH ADDITIONAL VESSEL  06/16/2020  . IR ANGIOGRAM SELECTIVE EACH ADDITIONAL VESSEL  06/16/2020  . IR ANGIOGRAM SELECTIVE EACH ADDITIONAL VESSEL  06/16/2020  . IR ANGIOGRAM SELECTIVE EACH ADDITIONAL VESSEL  06/16/2020  . IR ANGIOGRAM SELECTIVE EACH ADDITIONAL VESSEL  06/16/2020  . IR ANGIOGRAM SELECTIVE EACH ADDITIONAL VESSEL  06/30/2020  . IR ANGIOGRAM SELECTIVE EACH ADDITIONAL VESSEL  06/30/2020  . IR ANGIOGRAM VISCERAL SELECTIVE  06/16/2020  . IR ANGIOGRAM VISCERAL SELECTIVE  06/30/2020  . IR EMBO ARTERIAL NOT HEMORR HEMANG INC GUIDE ROADMAPPING  06/16/2020  . IR EMBO TUMOR ORGAN ISCHEMIA INFARCT INC GUIDE ROADMAPPING  06/30/2020  . IR RADIOLOGIST EVAL & MGMT  05/06/2020  . IR RADIOLOGIST EVAL & MGMT  05/20/2020  . IR US GUIDE VASC ACCESS RIGHT  06/16/2020  . IR US GUIDE VASC ACCESS RIGHT  06/30/2020  . LAPAROSCOPIC UMBILICAL HERNIA REPAIR W/ MESH  06-06-2011  . LEFT HEART CATHETERIZATION WITH CORONARY ANGIOGRAM N/A 03/28/2014   Procedure: LEFT HEART CATHETERIZATION WITH CORONARY ANGIOGRAM;  Surgeon: Henry W Smith III, MD;  Location: [facility];  Service: Cardiovascular;   Laterality: N/A;  . LIPOMA EXCISION Left 08/25/2017   Procedure: EXCISION LIPOMA LEFT POSTERIOR THIGH;  Surgeon: Connor, Chelsea A, MD;  Location: [facility];  Service: General;  Laterality: Left;  . NEPHROLITHOTOMY  1990'S  . OPEN APPENDECTOMY W/ PARTIAL CECECTOMY  05-16-2004  . PERCUTANEOUS CORONARY STENT INTERVENTION (PCI-S) N/A 06/28/2012   Procedure: PERCUTANEOUS CORONARY STENT INTERVENTION (PCI-S);  Surgeon: Michael Cooper, MD;  Location: [facility];  Service: Cardiovascular;  Laterality: N/A;    Prior to Admission medications   Medication Sig Start Date End Date Taking? Authorizing Provider  albuterol (VENTOLIN HFA) 108 (90 Base) MCG/ACT inhaler Inhale 2 puffs into the lungs every 6 (six) hours as needed for wheezing or shortness of breath. 03/12/20  Yes Crawford, Elizabeth A, MD  atorvastatin (LIPITOR) 40 MG tablet TAKE 1 TABLET BY MOUTH DAILY AT 6 PM Patient taking differently: Take by mouth daily. 05/13/20  Yes Crawford, Elizabeth A, MD  ferrous sulfate 325 (65 FE) MG EC tablet Take 1 tablet (325 mg total) by mouth daily with breakfast. 01/16/20  Yes Crawford,   Elizabeth A, MD  gabapentin (NEURONTIN) 100 MG capsule TAKE 1 CAPSULE(100 MG) BY MOUTH TWICE DAILY Patient taking differently: Take 100 mg by mouth 2 (two) times daily. 08/03/20  Yes Crawford, Elizabeth A, MD  glimepiride (AMARYL) 2 MG tablet Take 2 mg by mouth daily. 07/13/20  Yes [provider]  metFORMIN (GLUCOPHAGE) 850 MG tablet TAKE 1 TABLET(850 MG) BY MOUTH TWICE DAILY WITH A MEAL Patient taking differently: Take 850 mg by mouth 2 (two) times daily with a meal. 06/16/20  Yes Crawford, Elizabeth A, MD  nitroGLYCERIN (NITROSTAT) 0.4 MG SL tablet Place 1 tablet (0.4 mg total) under the tongue every 5 (five) minutes as needed for chest pain. 06/08/16  Yes Nahser, Philip J, MD  Sennosides (EX-LAX PO) Take 1 tablet by mouth daily as needed (constipation).   Yes [provider]  torsemide (DEMADEX) 20 MG tablet Take  40 mg (2 pills) every morning and every afternoon except Mon, Wed, Fri afternoons take only 20 mg (1 pill) 02/06/20  Yes Nahser, Philip J, MD  umeclidinium-vilanterol (ANORO ELLIPTA) 62.5-25 MCG/INH AEPB Inhale 1 puff into the lungs daily. 03/12/20  Yes Crawford, Elizabeth A, MD  Accu-Chek Softclix Lancets lancets USE AS DIRECTED TO TEST BLOOD SUGAR FOUR TIMES DAILY 03/13/20   Crawford, Elizabeth A, MD  blood glucose meter kit and supplies Dispense based on patient and insurance preference. Use up to four times daily as directed. (FOR ICD-10 E10.9, E11.9). 03/12/20   Crawford, Elizabeth A, MD  pantoprazole (PROTONIX) 40 MG tablet Take 1 tablet (40 mg total) by mouth daily. Patient not taking: Reported on 08/09/2020 04/23/20   Pokhrel, Laxman, MD  polyethylene glycol (MIRALAX / GLYCOLAX) 17 g packet Take 17 g by mouth daily as needed for moderate constipation. Patient not taking: Reported on 08/09/2020 04/23/20   Pokhrel, Laxman, MD  spironolactone (ALDACTONE) 25 MG tablet Take 1 tablet (25 mg total) by mouth daily. Patient not taking: Reported on 08/09/2020 01/04/20   Samtani, Jai-Gurmukh, MD    Scheduled Meds: . sodium chloride   Intravenous Once  . atorvastatin  40 mg Oral Daily  . furosemide  40 mg Intravenous Q6H  . gabapentin  100 mg Oral BID  . insulin aspart  0-20 Units Subcutaneous TID WC  . insulin glargine  10 Units Subcutaneous QHS  . umeclidinium-vilanterol  1 puff Inhalation Daily   Continuous Infusions: . sodium chloride Stopped (08/10/20 0249)  . pantoprozole (PROTONIX) infusion 8 mg/hr (08/10/20 0217)   PRN Meds:.acetaminophen **OR** acetaminophen, albuterol, morphine injection, nitroGLYCERIN, senna  Allergies as of 08/09/2020  . (No Known Allergies)    Family History  Problem Relation Age of Onset  . Lung cancer Sister   . Cancer Sister        lung  . Cancer Mother   . Cancer Father        died in his 70s.  . Cancer Brother        lung  . Coronary artery disease  Other   . Diabetes Other   . Colon cancer Other   . Cancer Sister        lung    Social History   Socioeconomic History  . Marital status: Married    Spouse name: Not on file  . Number of children: Not on file  . Years of education: Not on file  . Highest education level: Not on file  Occupational History  . Not on file  Tobacco Use  . Smoking status: Former Smoker      Packs/day: 2.00    Years: 40.00    Pack years: 80.00    Types: Cigarettes    Quit date: 08/29/1996    Years since quitting: 23.9  . Smokeless tobacco: Never Used  Vaping Use  . Vaping Use: Never used  Substance and Sexual Activity  . Alcohol use: Yes    Alcohol/week: 8.0 standard drinks    Types: 8 Cans of beer per week    Comment: 2 beers every other day  . Drug use: No  . Sexual activity: Yes  Other Topics Concern  . Not on file  Social History Narrative   Lives in [place] with wife, son, and son's family.   Social Determinants of Health   Financial Resource Strain: Not on file  Food Insecurity: Not on file  Transportation Needs: Not on file  Physical Activity: Not on file  Stress: Not on file  Social Connections: Not on file  Intimate Partner Violence: Not on file    Review of Systems: All negative except as stated above in HPI.  Physical Exam: Vital signs: Vitals:   08/10/20 0715 08/10/20 0800  BP: (!) 145/69 (!) 113/52  Pulse: 73 70  Resp: 19 14  Temp:    SpO2: 100% 100%  T 98   General:   Lethargic, disheveled, obese, no acute distress  Head: normocephalic, atraumatic Eyes: anicteric sclera ENT: oropharynx clear Neck: supple, nontender Lungs:  Clear throughout to auscultation.   No wheezes, crackles, or rhonchi. No acute distress. Heart:  Regular rate and rhythm; no murmurs, clicks, rubs,  or gallops. Abdomen: epigastric tenderness with guarding, otherwise nontender, soft, nondistended, +BS  Rectal:  Deferred Ext: 3+ pitting LE edema with chronic stasis appearing skin  GI:  Lab  Results: Recent Labs    08/09/20 1733 08/10/20 0605  WBC 3.5*  --   HGB 5.5* 6.3*  HCT 20.0* 23.5*  PLT 138*  --    BMET Recent Labs    08/09/20 1733 08/10/20 0548  NA 136 139  K 3.6 3.3*  CL 102 102  CO2 23 26  GLUCOSE 94 79  BUN 30* 27*  CREATININE 1.23 1.25*  CALCIUM 8.5* 8.6*   LFT Recent Labs    08/10/20 0548  PROT 6.5  ALBUMIN 3.3*  AST 22  ALT 15  ALKPHOS 106  BILITOT 1.4*   PT/INR No results for input(s): LABPROT, INR in the last 72 hours.   Studies/Results: DG Chest 2 View  Result Date: 08/09/2020 CLINICAL DATA:  Shortness of breath with exertion. EXAM: CHEST - 2 VIEW COMPARISON:  04/15/2020 FINDINGS: Median sternotomy and prior CABG. Stable cardiomegaly. Unchanged mediastinal contours with aortic atherosclerosis. Pulmonary edema with slight increase from prior exam. Minimal blunting of the costophrenic angles consistent with small effusions. There is fluid in the fissures. No confluent consolidation. No pneumothorax. No acute osseous abnormalities are seen. IMPRESSION: Increased pulmonary edema from prior exam with small pleural effusions. Stable cardiomegaly. Aortic Atherosclerosis (ICD10-I70.0). Electronically Signed   By: Melanie  Sanford M.D.   On: 08/09/2020 17:24    Impression/Plan: Decompensated alcoholic cirrhosis in the setting of hepatocellular carcinoma with melenic stools, shortness of breath. Hgb 5.5 on presentation and increase to 6.3 following 2 U PRBCs. No esophageal varices on EGD in August but he did have gastric varices. Doubt variceal bleed and suspect portal gastropathy vs peptic ulcer source. Check PT/INR. Add Octreotide drip. Continue Protonix drip. NPO. EGD today to further evaluate. Supportive care.    LOS: 1 day     Brighton Pilley C Vernor Monnig  08/10/2020, 8:45 AM  Questions please call [phone]  

## 2020-08-10 NOTE — Op Note (Signed)
[facility] Patient Name: [name] Procedure Date : 08/10/2020 MRN: [id number] Attending MD: Lear Ng , MD Date of Birth: October 21, 1945 CSN: [id number] Age: 74 Admit Type: Inpatient Procedure:                Upper GI endoscopy Indications:              Melena, Cirrhosis with suspected esophageal varices Providers:                Lear Ng, MD, Particia Nearing, RN, Janee Morn, Technician Referring MD:             hospital team Medicines:                Propofol per Anesthesia, Monitored Anesthesia Care Complications:            No immediate complications. Estimated Blood Loss:     Estimated blood loss was minimal. Procedure:                Pre-Anesthesia Assessment:                           - Prior to the procedure, a History and Physical                            was performed, and patient medications and                            allergies were reviewed. The patient's tolerance of                            previous anesthesia was also reviewed. The risks                            and benefits of the procedure and the sedation                            options and risks were discussed with the patient.                            All questions were answered, and informed consent                            was obtained. Prior Anticoagulants: The patient has                            taken no previous anticoagulant or antiplatelet                            agents. ASA Grade Assessment: IV - A patient with                            severe systemic disease that is a constant threat  to life. After reviewing the risks and benefits,                            the patient was deemed in satisfactory condition to                            undergo the procedure.                           After obtaining informed consent, the endoscope was                            passed under direct vision.  Throughout the                            procedure, the patient's blood pressure, pulse, and                            oxygen saturations were monitored continuously. The                            [id number]-H190 ([id number]) Olympus gastroscope was                            introduced through the mouth, and advanced to the                            second part of duodenum. The upper GI endoscopy was                            accomplished without difficulty. The patient                            tolerated the procedure well. Scope In: Scope Out: Findings:      Grade I varices were found in the lower third of the esophagus. No       stigmata of bleeding.      The Z-line was regular and was found 45 cm from the incisors.      Severe portal hypertensive gastropathy was found in the cardia, in the       gastric fundus and in the gastric body.      Diffuse friable mucosa with contact bleeding was found in the cardia, in       the gastric fundus and in the gastric body.      Type 1 isolated gastric varices (IGV1, varices located in the fundus)       with no bleeding were found in the gastric fundus. There were no       stigmata of recent bleeding. They were medium in largest diameter.      The examined duodenum was normal. Impression:               - Grade I esophageal varices.                           - Z-line regular, 45 cm from the incisors.                           -  Portal hypertensive gastropathy.                           - Friable gastric mucosa.                           - Type 1 isolated gastric varices (IGV1, varices                            located in the fundus), without bleeding.                           - Normal examined duodenum.                           - No specimens collected. Recommendation:           - Clear liquid diet.                           - Observe patient's clinical course.                           - Continue Octreotide and Protonix drips. Procedure  Code(s):        --- Professional ---                           [phone], Esophagogastroduodenoscopy, flexible,                            transoral; diagnostic, including collection of                            specimen(s) by brushing or washing, when performed                            (separate procedure) Diagnosis Code(s):        --- Professional ---                           K92.1, Melena (includes Hematochezia)                           K74.60, Unspecified cirrhosis of liver                           I86.4, Gastric varices                           I85.10, Secondary esophageal varices without                            bleeding                           K76.6, Portal hypertension                           K31.89, Other diseases of stomach and duodenum CPT copyright  2019 American Medical Association. All rights reserved. The codes documented in this report are preliminary and upon coder review may  be revised to meet current compliance requirements. Lear Ng, MD 08/10/2020 2:24:07 PM This report has been signed electronically. Number of Addenda: 0

## 2020-08-10 NOTE — Anesthesia Postprocedure Evaluation (Signed)
Anesthesia Post Note  Patient: [name]  Procedure(s) Performed: ESOPHAGOGASTRODUODENOSCOPY (EGD) WITH PROPOFOL (N/A )     Patient location during evaluation: PACU Anesthesia Type: MAC Level of consciousness: awake and alert Pain management: pain level controlled Vital Signs Assessment: post-procedure vital signs reviewed and stable Respiratory status: spontaneous breathing, nonlabored ventilation and respiratory function stable Cardiovascular status: blood pressure returned to baseline and stable Postop Assessment: no apparent nausea or vomiting Anesthetic complications: no   No complications documented.  Last Vitals:  Vitals:   08/10/20 1421 08/10/20 1433  BP: 127/62 137/72  Pulse: 71 72  Resp: 13 17  Temp:    SpO2: 100% 100%    Last Pain:  Vitals:   08/10/20 1433  TempSrc:   PainSc: 0-No pain                 Pervis Hocking

## 2020-08-10 NOTE — ED Notes (Signed)
Changed patient sheets replaced the external cath patient is resting with call bell in reach

## 2020-08-10 NOTE — Transfer of Care (Signed)
Immediate Anesthesia Transfer of Care Note  Patient: [name]  Procedure(s) Performed: ESOPHAGOGASTRODUODENOSCOPY (EGD) WITH PROPOFOL (N/A )  Patient Location: PACU and Endoscopy Unit  Anesthesia Type:MAC  Level of Consciousness: awake and drowsy  Airway & Oxygen Therapy: Patient Spontanous Breathing and Patient connected to nasal cannula oxygen  Post-op Assessment: Report given to RN and Post -op Vital signs reviewed and stable  Post vital signs: Reviewed and stable  Last Vitals:  Vitals Value Taken Time  BP 109/53 08/10/20 1411  Temp    Pulse 71 08/10/20 1411  Resp 13 08/10/20 1411  SpO2 99 % 08/10/20 1411  Vitals shown include unvalidated device data.  Last Pain:  Vitals:   08/10/20 1249  TempSrc: Temporal  PainSc: 0-No pain         Complications: No complications documented.

## 2020-08-10 NOTE — ED Notes (Signed)
Pt.is getting blood at this time 

## 2020-08-10 NOTE — ED Notes (Signed)
Placed a external cath patient is resting with call bell in reach

## 2020-08-11 ENCOUNTER — Encounter (HOSPITAL_COMMUNITY): Payer: Self-pay | Admitting: Gastroenterology

## 2020-08-11 DIAGNOSIS — D649 Anemia, unspecified: Secondary | ICD-10-CM

## 2020-08-11 LAB — CBC WITH DIFFERENTIAL/PLATELET
Abs Immature Granulocytes: 0.02 10*3/uL (ref 0.00–0.07)
Basophils Absolute: 0 10*3/uL (ref 0.0–0.1)
Basophils Relative: 1 %
Eosinophils Absolute: 0.1 10*3/uL (ref 0.0–0.5)
Eosinophils Relative: 1 %
HCT: 25 % — ABNORMAL LOW (ref 39.0–52.0)
Hemoglobin: 7 g/dL — ABNORMAL LOW (ref 13.0–17.0)
Immature Granulocytes: 1 %
Lymphocytes Relative: 8 %
Lymphs Abs: 0.3 10*3/uL — ABNORMAL LOW (ref 0.7–4.0)
MCH: 23 pg — ABNORMAL LOW (ref 26.0–34.0)
MCHC: 28 g/dL — ABNORMAL LOW (ref 30.0–36.0)
MCV: 82 fL (ref 80.0–100.0)
Monocytes Absolute: 0.4 10*3/uL (ref 0.1–1.0)
Monocytes Relative: 9 %
Neutro Abs: 3.4 10*3/uL (ref 1.7–7.7)
Neutrophils Relative %: 80 %
Platelets: 126 10*3/uL — ABNORMAL LOW (ref 150–400)
RBC: 3.05 MIL/uL — ABNORMAL LOW (ref 4.22–5.81)
RDW: 19 % — ABNORMAL HIGH (ref 11.5–15.5)
WBC: 4.3 10*3/uL (ref 4.0–10.5)
nRBC: 0 % (ref 0.0–0.2)

## 2020-08-11 LAB — COMPREHENSIVE METABOLIC PANEL
ALT: 16 U/L (ref 0–44)
AST: 23 U/L (ref 15–41)
Albumin: 3.2 g/dL — ABNORMAL LOW (ref 3.5–5.0)
Alkaline Phosphatase: 106 U/L (ref 38–126)
Anion gap: 10 (ref 5–15)
BUN: 21 mg/dL (ref 8–23)
CO2: 26 mmol/L (ref 22–32)
Calcium: 8.3 mg/dL — ABNORMAL LOW (ref 8.9–10.3)
Chloride: 103 mmol/L (ref 98–111)
Creatinine, Ser: 1.25 mg/dL — ABNORMAL HIGH (ref 0.61–1.24)
GFR, Estimated: >60 mL/min (ref >60)
Glucose, Bld: 113 mg/dL — ABNORMAL HIGH (ref 70–99)
Potassium: 3.3 mmol/L — ABNORMAL LOW (ref 3.5–5.1)
Sodium: 139 mmol/L (ref 135–145)
Total Bilirubin: 1.5 mg/dL — ABNORMAL HIGH (ref 0.3–1.2)
Total Protein: 6.3 g/dL — ABNORMAL LOW (ref 6.5–8.1)

## 2020-08-11 LAB — GLUCOSE, CAPILLARY
Glucose-Capillary: 79 mg/dL (ref 70–99)
Glucose-Capillary: 115 mg/dL — ABNORMAL HIGH (ref 70–99)
Glucose-Capillary: 168 mg/dL — ABNORMAL HIGH (ref 70–99)
Glucose-Capillary: 157 mg/dL — ABNORMAL HIGH (ref 70–99)

## 2020-08-11 LAB — HEMOGLOBIN AND HEMATOCRIT, BLOOD
HCT: 29.2 % — ABNORMAL LOW (ref 39.0–52.0)
Hemoglobin: 8.2 g/dL — ABNORMAL LOW (ref 13.0–17.0)

## 2020-08-11 LAB — PREPARE RBC (CROSSMATCH)

## 2020-08-11 MED ORDER — SODIUM CHLORIDE 0.9% IV SOLUTION: Freq: Once | INTRAVENOUS | Status: AC

## 2020-08-11 MED ORDER — FUROSEMIDE 10 MG/ML IJ SOLN
40.0000 mg | Freq: Two times a day (BID) | INTRAMUSCULAR | Status: DC
  Administered 2020-08-11 – 2020-08-13 (×4): 40 mg via INTRAVENOUS
  Filled 2020-08-11 (×4): qty 4

## 2020-08-11 MED ORDER — IPRATROPIUM-ALBUTEROL 0.5-2.5 (3) MG/3ML IN SOLN
3.0000 mL | Freq: Two times a day (BID) | RESPIRATORY_TRACT | Status: DC
  Administered 2020-08-11 – 2020-08-18 (×15): 3 mL via RESPIRATORY_TRACT
  Filled 2020-08-11 (×15): qty 3

## 2020-08-11 MED ORDER — POTASSIUM CHLORIDE CRYS ER 20 MEQ PO TBCR
40.0000 meq | EXTENDED_RELEASE_TABLET | Freq: Once | ORAL | Status: AC
  Administered 2020-08-11: 40 meq via ORAL
  Filled 2020-08-11: qty 2

## 2020-08-11 NOTE — Progress Notes (Signed)
Daily Progress Note   Patient Name: [name]       Date: 08/11/2020 DOB: June 23, 1946  Age: 74 y.o. MRN#: [id number] Attending Physician: Alma Friendly, MD Primary Care Physician: [name], MD Admit Date: 08/09/2020  Reason for Follow-up: continued GOC discussion  Subjective: Reviewed chart and noted that patient changed his mind about code status overnight and has decided to be full code.   Patient remains on protonix and octreotide infusions. He is s/p 3 units PRBCs on 12/13 and received another 1 unit PRBC today for hemoglobin of 7.0   19:15--I spoke with son [name] by phone to discuss diagnosis, prognosis, and goals of care. We discussed his diagnosis of [place] and I expressed concern that patient seemed to have th understanding that radioembolization treatment was curative. [name] expresses understanding that the treatment is life-prolonging but not curative, and has tried to explain this to his father. He reports he is supposed to have repeat imaging, but this had to be re-scheduled.   We discussed advanced directives, specifically healthcare decision making. [name] shares that patient's wife (his mother) is not in good health and would have difficulty making complex medical decisions. Patient stated to [name] earlier today that he wanted him designated as the primary HCPOA. I let him know I would have spiritual care assist with this tomorrow.   I also discussed the issue of code status with [name]. I shared that during our initial discussion yesterday, patient had indicated he would not want to be resuscitated and placed on life support but had later changed his mind. We discussed evidenced based poor outcomes in similar hospitalized patients, as the cause of the arrest is  likely associated with chronic/terminal disease rather than a reversible acute cardio-pulmonary event. [name] shares that his father has expressed to him previosuly that he would want to be resuscitated, but would not want to be prolonged on mechanical ventilation, and would not want a tracheostomy.   Length of Stay: 2  Current Medications: Scheduled Meds:  . sodium chloride   Intravenous Once  . atorvastatin  40 mg Oral Daily  . furosemide  40 mg Intravenous BID  . gabapentin  100 mg Oral BID  . insulin aspart  0-20 Units Subcutaneous TID WC  . insulin glargine  10 Units Subcutaneous QHS  .  ipratropium-albuterol  3 mL Nebulization BID    Continuous Infusions: . sodium chloride Stopped (08/10/20 0249)  . octreotide  (SANDOSTATIN)    IV infusion 50 mcg/hr (08/10/20 2205)  . pantoprozole (PROTONIX) infusion 8 mg/hr (08/10/20 2202)    PRN Meds: acetaminophen **OR** acetaminophen, albuterol, morphine injection, nitroGLYCERIN, senna    Vital Signs: BP (!) 106/57 (BP Location: Right Wrist)   Pulse 73   Temp 97.9 F (36.6 C) (Oral)   Resp 16   Ht 5\' 8"  (1.727 m)   Wt 114.4 kg   SpO2 100%   BMI 38.35 kg/m  SpO2: SpO2: 100 % O2 Device: O2 Device: Nasal Cannula O2 Flow Rate: O2 Flow Rate (L/min): 2 L/min  Intake/output summary:   Intake/Output Summary (Last 24 hours) at 08/11/2020 1921 Last data filed at 08/11/2020 1855 Gross per 24 hour  Intake 1677.87 ml  Output 625 ml  Net 1052.87 ml   LBM: Last BM Date: 08/10/20 Baseline Weight: Weight: 106.6 kg Most recent weight: Weight: 114.4 kg       Palliative Assessment/Data: PPS 60%      Patient Active Problem List   Diagnosis Date Noted  . Hepatocellular carcinoma ([place]) 08/09/2020  . Routine general medical examination at a health care facility 05/06/2020  . Iron deficiency anemia   . Symptomatic anemia 04/15/2020  . Pulmonary hypertension, unspecified ([place]) 03/11/2020  . Asthma, mild intermittent 12/27/2019  .  Alcoholic cirrhosis of liver with ascites ([place]) 12/27/2019  . (HFpEF) heart failure with preserved ejection fraction ([place]) 12/24/2018  . CKD (chronic kidney disease), stage III ([place]) 12/18/2017  . Peptic ulcer disease 12/18/2017  . GI bleed 09/29/2017  . Morbid obesity ([place]) 02/05/2016  . S/P CABG x 3 03/31/2014  . Hepatic cirrhosis ([place]) 02/27/2014  . Hyperlipidemia with target LDL less than 70 06/29/2012  . Thrombocytopenia ([place]) 06/29/2012  . Coronary artery disease involving native coronary artery of native heart without angina pectoris   . OSA (obstructive sleep apnea)   . Type 2 diabetes with complication ([place]) 46/56/8127  . GERD without esophagitis 08/04/2010  . History of gout   . Hypertensive heart disease     Palliative Care Assessment & Plan   HPI/Patient Profile: 74 y.o. male  with past medical history of liver cirrhosis with known varices, hepatocellular carcinoma, CAD s/p CABG, COPD, HFpEF, and diabetes mellitus type 2. He presented to the emergency department on 08/09/2020 with shortness of breath, increased peripheral edema, and black stools.  ED Course: Vital signs stable. Labs revealed creatinine 1.23, BNP 326.3, hemoglobin 5.5 (down from baseline around 8). He was given IV lasix, transfused 2 units PRBCs, and started on IV protonix infusion. He was admitted to [facility] for management of GI bleed and acute on chronic HFpEF.   EGD done 12/13 showed grade 1 esophageal varices, portal hypertensive gastropathy, friable gastric mucosa, and type 1 isolated gastric varices without bleeding.   Patient initially diagnosed with right hepatocellular carcinoma in September 2021 (confirmed with contrast MRI); his BCLC is intermediate stage. He is followed by interventional radiology and has been receiving radioembolization treatment.     Assessment: - decompensated alcoholic liver cirrhosis - history of gastric varices - severe poral hypertensive gastropathy - acute on chronic blood  loss anemia - acute on chronic diastolic HF - hepatocellular carcinoma, s/p radioembolization  Recommendations/Plan: - full code  - continue current medical treatment - goal of care is to return home and regain previous functional status - patient needs additional discussions regarding prognosis  of [place] - spiritual care to assist with HCPOA documents [name] will be primary) - PMT will continue to follow  Goals of Care and Additional Recommendations: Limitations on Scope of Treatment: No prolonged mechanical ventilation, no tracheostomy  Code Status: Full code  Prognosis: Difficult to determine, but poor overall   Discharge Planning: Home  Care plan was discussed with Dr. Keenan Bachelor and nursing  Thank you for allowing the Palliative Medicine Team to assist in the care of this patient.   Total Time 25 minutes Prolonged Time Billed  no       Greater than 50%  of this time was spent counseling and coordinating care related to the above assessment and plan.  Lavena Bullion, NP  Please contact Palliative Medicine Team phone at [phone] for questions and concerns.

## 2020-08-11 NOTE — Progress Notes (Signed)
PROGRESS NOTE  [name] YWV:[id number] DOB: 04-26-1946 DOA: 08/09/2020 PCP: [name], MD  HPI/Recap of past 24 hours: HPI from Dr [name] is a 74 y.o. male with medical history significant of CAD s/p CABG, COPD,  HFpEF, Cirrhosis of the liver with known varices, h/o GI bleeding, hepatocellular carcinoma developed acute SOB/DOE at home. He also had increased peripheral edema. He reports having black stools with epigastric pain for about a couple of days.  Denies any hematochezia, vomiting, fever/chills.  Due to his acute symptoms he presents to [facility] for evaluation.  Quit drinking alcohol after his last hospitalization in August 2021.  Denies any NSAID use.  Due to chemo embolization for his Juda last month.  In the ED, vital signs somewhat stable, noted to have significant anasarca. Lab revelaed Cr 1.23, BNP 326.3, Trop #1 13, #2 13, Hgb 5.5 down from baseline of approximately 8. CXR reveals increased pulmonary edema. In the ED he was given IV lasix 40 mg, given 2 units of PRBC, IV PPI. GI consulted.  Patient admitted for further management.    Today, patient still short of breath, denies any further bright red blood per rectum or melanotic stool, denies any chest pain, abdominal pain, nausea/vomiting, fever/chills.    Assessment/Plan: Active Problems:   Type 2 diabetes with complication (HCC)   OSA (obstructive sleep apnea)   Hyperlipidemia with target LDL less than 70   GI bleed   CKD (chronic kidney disease), stage III (HCC)   (HFpEF) heart failure with preserved ejection fraction (HCC)   Hepatocellular carcinoma (HCC)   Likely upper GI bleed History of gastric varices FOBT positive, with melanotic stools Hemoglobin dropped to 5.5 on presentation, baseline around 8 Last EGD in August/21 showed gastric varices, nonbleeding GI consulted, EGD on 08/10/2020 showed severe portal hypertensive gastropathy in the stomach with diffuse friable mucosa with  contact bleeding, grade 1 esophageal varices and type I isolated gastric varices was noted with no bleeding or stigmata of recent bleeding Continue PPI infusion, octreotide drip Monitor closely  Acute on chronic blood loss anemia Likely 2/2 above Hemoglobin 5.5 on admission, baseline around 8 S/p 3 units of PRBC on 08/10/2020, another 1 unit of PRBC on 08/11/2020 Daily CBC  Decompensated alcoholic liver cirrhosis Noted to have an anasarca, oriented LFTs WNL, T bili 1.4, will trend RUQ ultrasound, unchanged, no ascites Continue IV Lasix Hold home torsemide, Aldactone for now  Acute on chronic diastolic HF Noted anasarca BNP 326 Chest x-ray showed increased pulmonary edema Troponin WNL, EKG with no acute ST changes Last echo done on 04/2020 showed EF of 60 to 65% with grade 3 diastolic dysfunction Continue IV Lasix, hold home losartan for now Cardiology consulted on 08/11/2020, to assist with diuresis Strict I's and O's, daily weights  Hypokalemia Replace as needed  History of asthma DuoNeb, continue inhaler Supplemental O2 as needed  CKD stage II Creatinine stable at baseline Daily BMP  Diabetes mellitus type 2 Last A1c 5.3% Continue SSI, Accu-Cheks, Lantus, hypoglycemic protocol Hold home regimen  Hypertension BP soft Hold home losartan  Hepatocellular carcinoma Patient currently follows with IR for embolization Continue outpatient follow-up  OSA Noncompliant with CPAP  Obesity Lifestyle modification advised  [place] Patient with multiple comorbidities, overall poor prognosis Palliative consulted-initially agreed to be DNR, but changed his mind and wants to be full code        Malnutrition Type:      Malnutrition Characteristics:  Nutrition Interventions:       Estimated body mass index is 38.35 kg/m as calculated from the following:   Height as of this encounter: 5\' 8"  (1.727 m).   Weight as of this encounter: 114.4 kg.     Code  Status: Full code  Family Communication: None at bedside  Disposition Plan: Status is: Inpatient  Remains inpatient appropriate because:Inpatient level of care appropriate due to severity of illness   Dispo: The patient is from: Home              Anticipated d/c is to: Home              Anticipated d/c date is: 2 days              Patient currently is not medically stable to d/c.     Consultants:  GI  Consulted cardiology  Procedures:  EGD  Antimicrobials:  None  DVT prophylaxis: SCDs   Objective: Vitals:   08/11/20 0949 08/11/20 1006 08/11/20 1254 08/11/20 1428  BP: (!) 126/54 (!) 113/49 (!) 111/43 (!) 106/57  Pulse: 62 61 61 73  Resp: (!) 22 16 18 16   Temp: 97.7 F (36.5 C) (!) 97.3 F (36.3 C) 97.7 F (36.5 C) 97.9 F (36.6 C)  TempSrc: Axillary Axillary Oral Oral  SpO2: 100% 100%  100%  Weight:      Height:        Intake/Output Summary (Last 24 hours) at 08/11/2020 1748 Last data filed at 08/11/2020 1500 Gross per 24 hour  Intake 1084.87 ml  Output 625 ml  Net 459.87 ml   Filed Weights   08/09/20 1944 08/11/20 0509  Weight: 106.6 kg 114.4 kg    Exam:  General:  Acutely ill-appearing  Cardiovascular: S1, S2 present  Respiratory: Bibasilar crackles noted  Abdomen: Soft, nontender, +distended/obese, bowel sounds present  Musculoskeletal: 3+ bilateral pedal edema noted  Skin:  Bilateral lower extremity chronic venous stasis changes noted  Psychiatry: Normal mood   Data Reviewed: CBC: Recent Labs  Lab 08/09/20 1733 08/10/20 0605 08/10/20 1322 08/10/20 1525 08/10/20 2140 08/11/20 0306 08/11/20 1521  WBC 3.5*  --   --   --   --  4.3  --   NEUTROABS  --   --   --   --   --  3.4  --   HGB 5.5*   < > 8.8* 8.5* 7.6* 7.0* 8.2*  HCT 20.0*   < > 26.0* 28.6* 26.9* 25.0* 29.2*  MCV 79.7*  --   --   --   --  82.0  --   PLT 138*  --   --   --   --  126*  --    < > = values in this interval not displayed.   Basic Metabolic  Panel: Recent Labs  Lab 08/09/20 1733 08/10/20 0548 08/10/20 1322 08/11/20 0306  NA 136 139 141 139  K 3.6 3.3* 3.6 3.3*  CL 102 102 101 103  CO2 23 26  --  26  GLUCOSE 94 79 87 113*  BUN 30* 27* 31* 21  CREATININE 1.23 1.25* 1.20 1.25*  CALCIUM 8.5* 8.6*  --  8.3*   GFR: Estimated Creatinine Clearance: 63.7 mL/min (A) (by C-G formula based on SCr of 1.25 mg/dL (H)). Liver Function Tests: Recent Labs  Lab 08/10/20 0548 08/11/20 0306  AST 22 23  ALT 15 16  ALKPHOS 106 106  BILITOT 1.4* 1.5*  PROT 6.5 6.3*  ALBUMIN 3.3*  3.2*   No results for input(s): LIPASE, AMYLASE in the last 168 hours. No results for input(s): AMMONIA in the last 168 hours. Coagulation Profile: Recent Labs  Lab 08/10/20 1525  INR 1.2   Cardiac Enzymes: No results for input(s): CKTOTAL, CKMB, CKMBINDEX, TROPONINI in the last 168 hours. BNP (last 3 results) No results for input(s): PROBNP in the last 8760 hours. HbA1C: Recent Labs    08/10/20 0609  HGBA1C 5.3   CBG: Recent Labs  Lab 08/10/20 1647 08/10/20 2137 08/11/20 0750 08/11/20 1317 08/11/20 1642  GLUCAP 91 160* 79 115* 168*   Lipid Profile: No results for input(s): CHOL, HDL, LDLCALC, TRIG, CHOLHDL, LDLDIRECT in the last 72 hours. Thyroid Function Tests: No results for input(s): TSH, T4TOTAL, FREET4, T3FREE, THYROIDAB in the last 72 hours. Anemia Panel: No results for input(s): VITAMINB12, FOLATE, FERRITIN, TIBC, IRON, RETICCTPCT in the last 72 hours. Urine analysis:    Component Value Date/Time   COLORURINE YELLOW 05/06/2020 [place] 05/06/2020 0954   LABSPEC 1.009 05/06/2020 0954   PHURINE 7.0 05/06/2020 0954   GLUCOSEU NEGATIVE 05/06/2020 0954   HGBUR NEGATIVE 05/06/2020 0954   BILIRUBINUR NEGATIVE 07/29/2019 1849   KETONESUR NEGATIVE 05/06/2020 0954   PROTEINUR NEGATIVE 05/06/2020 0954   UROBILINOGEN 0.2 03/29/2014 1546   NITRITE NEGATIVE 05/06/2020 0954   LEUKOCYTESUR NEGATIVE 05/06/2020 0954    Sepsis Labs: @LABRCNTIP (procalcitonin:4,lacticidven:4)  ) Recent Results (from the past 240 hour(s))  Resp Panel by RT-PCR (Flu A&B, Covid) Nasopharyngeal Swab     Status: None   Collection Time: 08/09/20  7:47 PM   Specimen: Nasopharyngeal Swab; Nasopharyngeal(NP) swabs in vial transport medium  Result Value Ref Range Status   SARS Coronavirus 2 by RT PCR NEGATIVE NEGATIVE Final    Comment: (NOTE) SARS-CoV-2 target nucleic acids are NOT DETECTED.  The SARS-CoV-2 RNA is generally detectable in upper respiratory specimens during the acute phase of infection. The lowest concentration of SARS-CoV-2 viral copies this assay can detect is 138 copies/mL. A negative result does not preclude SARS-Cov-2 infection and should not be used as the sole basis for treatment or other patient management decisions. A negative result may occur with  improper specimen collection/handling, submission of specimen other than nasopharyngeal swab, presence of viral mutation(s) within the areas targeted by this assay, and inadequate number of viral copies(<138 copies/mL). A negative result must be combined with clinical observations, patient history, and epidemiological information. The expected result is Negative.  Fact Sheet for Patients:  EntrepreneurPulse.com.au  Fact Sheet for Healthcare Providers:  IncredibleEmployment.be  This test is no t yet approved or cleared by the [place] FDA and  has been authorized for detection and/or diagnosis of SARS-CoV-2 by FDA under an Emergency Use Authorization (EUA). This EUA will remain  in effect (meaning this test can be used) for the duration of the COVID-19 declaration under Section 564(b)(1) of the Act, 21 U.S.C.section 360bbb-3(b)(1), unless the authorization is terminated  or revoked sooner.       Influenza A by PCR NEGATIVE NEGATIVE Final   Influenza B by PCR NEGATIVE NEGATIVE Final    Comment: (NOTE) The  Xpert Xpress SARS-CoV-2/FLU/RSV plus assay is intended as an aid in the diagnosis of influenza from Nasopharyngeal swab specimens and should not be used as a sole basis for treatment. Nasal washings and aspirates are unacceptable for Xpert Xpress SARS-CoV-2/FLU/RSV testing.  Fact Sheet for Patients: EntrepreneurPulse.com.au  Fact Sheet for Healthcare Providers: IncredibleEmployment.be  This test is not yet approved or  cleared by the [place] and has been authorized for detection and/or diagnosis of SARS-CoV-2 by FDA under an Emergency Use Authorization (EUA). This EUA will remain in effect (meaning this test can be used) for the duration of the COVID-19 declaration under Section 564(b)(1) of the Act, 21 U.S.C. section 360bbb-3(b)(1), unless the authorization is terminated or revoked.  Performed at [facility], [place] [address], [place], [place] [address]       Studies: No results found.  Scheduled Meds: . sodium chloride   Intravenous Once  . atorvastatin  40 mg Oral Daily  . furosemide  40 mg Intravenous BID  . gabapentin  100 mg Oral BID  . insulin aspart  0-20 Units Subcutaneous TID WC  . insulin glargine  10 Units Subcutaneous QHS  . ipratropium-albuterol  3 mL Nebulization BID    Continuous Infusions: . sodium chloride Stopped (08/10/20 0249)  . octreotide  (SANDOSTATIN)    IV infusion 50 mcg/hr (08/10/20 2205)  . pantoprozole (PROTONIX) infusion 8 mg/hr (08/10/20 2202)     LOS: 2 days     Alma Friendly, MD [facility]  If 7PM-7AM, please contact night-coverage [email or web address] 08/11/2020, 5:48 PM

## 2020-08-11 NOTE — Progress Notes (Signed)
[facility] Progress Note  [name] 74 y.o. 08-09-1946   Subjective: Denies rectal bleeding overnight. Denies abdominal pain. Sitting on side of bed.  Objective: Vital signs: Vitals:   08/11/20 0949 08/11/20 1006  BP: (!) 126/54 (!) 113/49  Pulse: 62 61  Resp: (!) 22 16  Temp: 97.7 F (36.5 C) (!) 97.3 F (36.3 C)  SpO2: 100% 100%    Physical Exam: Gen: alert, no acute distress, obese, elderly HEENT: anicteric sclera CV: RRR Chest: CTA B Abd: epigastric tenderness with guarding, soft, nondistended, +BS Ext: 3+ LE edema  Lab Results: Recent Labs    08/10/20 0548 08/10/20 1322 08/11/20 0306  NA 139 141 139  K 3.3* 3.6 3.3*  CL 102 101 103  CO2 26  --  26  GLUCOSE 79 87 113*  BUN 27* 31* 21  CREATININE 1.25* 1.20 1.25*  CALCIUM 8.6*  --  8.3*   Recent Labs    08/10/20 0548 08/11/20 0306  AST 22 23  ALT 15 16  ALKPHOS 106 106  BILITOT 1.4* 1.5*  PROT 6.5 6.3*  ALBUMIN 3.3* 3.2*   Recent Labs    08/09/20 1733 08/10/20 0605 08/10/20 2140 08/11/20 0306  WBC 3.5*  --   --  4.3  NEUTROABS  --   --   --  3.4  HGB 5.5*   < > 7.6* 7.0*  HCT 20.0*   < > 26.9* 25.0*  MCV 79.7*  --   --  82.0  PLT 138*  --   --  126*   < > = values in this interval not displayed.      Assessment/Plan: Decompensated cirrhosis in setting of [place] with melena prior to admit. EGD showed severe portal gastropathy that is likely the source of his GI bleed. Isolated gastric varices without stigmata and minimal esophageal varices without stigmata. Slowly advance diet. Continue Octreotide drip. Follow H/Hs. Supportive care. Encouraged continued alcohol abstinence. Will f/u.   Lear Ng 08/11/2020, 10:14 AM  Questions please call [phone] ID: [name], male   DOB: 14-Jun-1946, 74 y.o.   MRN: [id number]

## 2020-08-11 NOTE — Plan of Care (Signed)

## 2020-08-11 NOTE — Progress Notes (Signed)
This chaplain responded to PMT consult for spiritual care and assisting Pt. and family in the completion of the Pt. HCPOA.  The Pt. will wake up to the call of his name, but remains unable to stay awake.  The chaplain understands the Pt. son-[name] may visit today.  The chaplain left the AD document on the window sill. The chaplain will F/U as needed and communicate with the Pt. RN-Mo.

## 2020-08-11 NOTE — Progress Notes (Signed)
Overnight floor coverage progress note  Patient with medical history of CAD status post CABG, COPD, HFpEF, alcoholic liver cirrhosis with known varices, history of GI bleed, hepatocellular carcinoma, asthma, CKD stage II, type 2 diabetes, hypertension, OSA, obesity currently admitted for GI bleed, acute on chronic blood loss anemia requiring blood transfusions, decompensated liver cirrhosis, and acute on chronic diastolic CHF.  At the time of admission, patient's CODE STATUS was documented as full code.  He was seen by palliative care yesterday and his CODE STATUS was changed to DNR/DNI.  I was just notified by the patient's nurse that when she went to place a DNR band on the patient's wrist, he changed his mind and told her he wanted to be full code.  I spoke to [name] and discussed CODE STATUS with him at length.  He wishes to be FULL CODE.  He is AAOx4 and appears to have decision-making capacity.  Given multiple comorbidities and overall poor prognosis, consider reengaging palliative care service in the morning.

## 2020-08-12 LAB — COMPREHENSIVE METABOLIC PANEL
ALT: 16 U/L (ref 0–44)
AST: 22 U/L (ref 15–41)
Albumin: 3.3 g/dL — ABNORMAL LOW (ref 3.5–5.0)
Alkaline Phosphatase: 100 U/L (ref 38–126)
Anion gap: 10 (ref 5–15)
BUN: 21 mg/dL (ref 8–23)
CO2: 26 mmol/L (ref 22–32)
Calcium: 8.2 mg/dL — ABNORMAL LOW (ref 8.9–10.3)
Chloride: 102 mmol/L (ref 98–111)
Creatinine, Ser: 1.3 mg/dL — ABNORMAL HIGH (ref 0.61–1.24)
GFR, Estimated: 58 mL/min — ABNORMAL LOW (ref >60)
Glucose, Bld: 107 mg/dL — ABNORMAL HIGH (ref 70–99)
Potassium: 3.8 mmol/L (ref 3.5–5.1)
Sodium: 138 mmol/L (ref 135–145)
Total Bilirubin: 1.7 mg/dL — ABNORMAL HIGH (ref 0.3–1.2)
Total Protein: 6.2 g/dL — ABNORMAL LOW (ref 6.5–8.1)

## 2020-08-12 LAB — TYPE AND SCREEN
ABO/RH(D): O POS
Antibody Screen: NEGATIVE
Unit division: 0
Unit division: 0
Unit division: 0
Unit division: 0

## 2020-08-12 LAB — BPAM RBC
Blood Product Expiration Date: 202201132359
Blood Product Expiration Date: 202201132359
Blood Product Expiration Date: 202201132359
Blood Product Expiration Date: 202201152359
ISSUE DATE / TIME: 202112122051
ISSUE DATE / TIME: 202112122335
ISSUE DATE / TIME: 202112130849
ISSUE DATE / TIME: 202112140952
Unit Type and Rh: 5100
Unit Type and Rh: 5100
Unit Type and Rh: 5100
Unit Type and Rh: 5100

## 2020-08-12 LAB — CBC WITH DIFFERENTIAL/PLATELET
Abs Immature Granulocytes: 0.01 10*3/uL (ref 0.00–0.07)
Basophils Absolute: 0 10*3/uL (ref 0.0–0.1)
Basophils Relative: 0 %
Eosinophils Absolute: 0.1 10*3/uL (ref 0.0–0.5)
Eosinophils Relative: 2 %
HCT: 25.5 % — ABNORMAL LOW (ref 39.0–52.0)
Hemoglobin: 7.6 g/dL — ABNORMAL LOW (ref 13.0–17.0)
Immature Granulocytes: 0 %
Lymphocytes Relative: 7 %
Lymphs Abs: 0.3 10*3/uL — ABNORMAL LOW (ref 0.7–4.0)
MCH: 24 pg — ABNORMAL LOW (ref 26.0–34.0)
MCHC: 29.8 g/dL — ABNORMAL LOW (ref 30.0–36.0)
MCV: 80.4 fL (ref 80.0–100.0)
Monocytes Absolute: 0.5 10*3/uL (ref 0.1–1.0)
Monocytes Relative: 9 %
Neutro Abs: 4 10*3/uL (ref 1.7–7.7)
Neutrophils Relative %: 82 %
Platelets: 124 10*3/uL — ABNORMAL LOW (ref 150–400)
RBC: 3.17 MIL/uL — ABNORMAL LOW (ref 4.22–5.81)
RDW: 19.3 % — ABNORMAL HIGH (ref 11.5–15.5)
WBC: 4.9 10*3/uL (ref 4.0–10.5)
nRBC: 0 % (ref 0.0–0.2)

## 2020-08-12 LAB — GLUCOSE, CAPILLARY
Glucose-Capillary: 92 mg/dL (ref 70–99)
Glucose-Capillary: 210 mg/dL — ABNORMAL HIGH (ref 70–99)
Glucose-Capillary: 122 mg/dL — ABNORMAL HIGH (ref 70–99)
Glucose-Capillary: 115 mg/dL — ABNORMAL HIGH (ref 70–99)

## 2020-08-12 MED ORDER — SALINE SPRAY 0.65 % NA SOLN
1.0000 | NASAL | Status: DC | PRN
  Administered 2020-08-12: 1 via NASAL
  Filled 2020-08-12 (×2): qty 44

## 2020-08-12 MED ORDER — PANTOPRAZOLE SODIUM 40 MG IV SOLR
40.0000 mg | INTRAVENOUS | Status: DC
  Administered 2020-08-12 – 2020-08-13 (×2): 40 mg via INTRAVENOUS
  Filled 2020-08-12 (×2): qty 40

## 2020-08-12 NOTE — Progress Notes (Signed)
Reached out to on call attending concerning HGB. No new orders. Will update dayshift nurse.

## 2020-08-12 NOTE — Plan of Care (Signed)

## 2020-08-12 NOTE — Evaluation (Signed)
Physical Therapy Evaluation Patient Details Name: [name] MRN: [id number] DOB: [birth year] Today's Date: 08/12/2020   History of Present Illness  74 yo male with onset of anasarca and pleural effusion was admitted for symptoms of GI bleed and had EGD on 12.13.  He is anemic at 5.5 initially, SOB and had epigastric pain.  EGD located gastric nonbleeding varices with portal hypertensive gastropathy.  PMHx:  liver mass, CHF, CAD, cirrhosis, transfusions, OSA, anemia, ankle fracture ORIF  Clinical Impression  Pt was seen for mobility on RW with help, using 1L O2 and cued assistance for maneuvering walker.  Follow pt for LE strengthening as he is weaker and having trouble with moving even on walker.  Had been up with no AD previous to this admission, and now is unable to walk alone even on RW.  Focus on strength and control of standing balance, esp as it is reduced by pain on L knee and confined spaces to walk.  Will expect to see pt for goals of acute therapy.    Follow Up Recommendations Home health PT;Supervision for mobility/OOB    Equipment Recommendations  None recommended by PT    Recommendations for Other Services       Precautions / Restrictions Precautions Precautions: Fall Precaution Comments: monitor O2 sats Restrictions Weight Bearing Restrictions: No Other Position/Activity Restrictions: BLE edema; h/o painful L knee      Mobility  Bed Mobility Overal bed mobility: Needs Assistance             General bed mobility comments: in chair and bedside during session    Transfers Overall transfer level: Needs assistance Equipment used: Rolling walker (2 wheeled) Transfers: Sit to/from Stand Sit to Stand: Mod assist         General transfer comment: mod power up then instructions for turning and setting up to sit in chair after walking  Ambulation/Gait Ambulation/Gait assistance: Min assist;Min guard Gait Distance (Feet): 25 Feet Assistive device: Rolling  walker (2 wheeled);1 person hand held assist Gait Pattern/deviations: Step-to pattern;Step-through pattern;Decreased stride length;Wide base of support;Trunk flexed Gait velocity: reduced Gait velocity interpretation: <1.31 ft/sec, indicative of household ambulator General Gait Details: reluctant to walk with hesitant steps  Stairs            Wheelchair Mobility    Modified Rankin (Stroke Patients Only)       Balance Overall balance assessment: Needs assistance Sitting-balance support: No upper extremity supported;Feet supported Sitting balance-Leahy Scale: Fair Sitting balance - Comments: sits on EOB   Standing balance support: Bilateral upper extremity supported Standing balance-Leahy Scale: Poor Standing balance comment: requiring extra support to use the walker                             Pertinent Vitals/Pain Pain Assessment: Faces Faces Pain Scale: Hurts even more Pain Location: L knee Pain Descriptors / Indicators: Grimacing;Guarding Pain Intervention(s): Limited activity within patient's tolerance;Repositioned    Home Living Family/patient expects to be discharged to:: Private residence Living Arrangements: Spouse/significant other;Children Available Help at Discharge: Family;Available 24 hours/day Type of Home: House Home Access: Stairs to enter     Home Layout: Two level;Able to live on main level with bedroom/bathroom Home Equipment: Kasandra Knudsen - single point;Walker - 2 wheels Additional Comments: mult familymembers but wife not in good health    Prior Function Level of Independence: Independent         Comments: has been walking without AD but  also no O2     Hand Dominance   Dominant Hand: Right    Extremity/Trunk Assessment   Upper Extremity Assessment Upper Extremity Assessment: Generalized weakness    Lower Extremity Assessment Lower Extremity Assessment: Generalized weakness    Cervical / Trunk Assessment Cervical / Trunk  Assessment: Kyphotic (mild)  Communication   Communication: HOH (hears best in R ear)  Cognition Arousal/Alertness: Awake/alert Behavior During Therapy: WFL for tasks assessed/performed Overall Cognitive Status: No family/caregiver present to determine baseline cognitive functioning                                 General Comments: may have issues from [facility] that hinder his understanding of quesions and cues      General Comments General comments (skin integrity, edema, etc.): 1L O2 with low sats at end of walk    Exercises     Assessment/Plan    PT Assessment Patient needs continued PT services  PT Problem List Cardiopulmonary status limiting activity;Decreased strength;Decreased balance;Decreased activity tolerance;Decreased mobility;Decreased safety awareness;Obesity;Decreased skin integrity       PT Treatment Interventions DME instruction;Gait training;Stair training;Functional mobility training;Therapeutic activities;Therapeutic exercise;Neuromuscular re-education;Patient/family education;Balance training    PT Goals (Current goals can be found in the Care Plan section)  Acute Rehab PT Goals Patient Stated Goal: decreased edema in BLE. PT Goal Formulation: With patient Time For Goal Achievement: 08/26/20 Potential to Achieve Goals: Good    Frequency Min 3X/week   Barriers to discharge Decreased caregiver support Wife is not in good health    Co-evaluation PT/OT/SLP Co-Evaluation/Treatment: Yes             AM-PAC PT "6 Clicks" Mobility  Outcome Measure Help needed turning from your back to your side while in a flat bed without using bedrails?: A Little Help needed moving from lying on your back to sitting on the side of a flat bed without using bedrails?: A Little Help needed moving to and from a bed to a chair (including a wheelchair)?: A Lot Help needed standing up from a chair using your arms (e.g., wheelchair or bedside chair)?: A Lot Help  needed to walk in hospital room?: A Little Help needed climbing 3-5 steps with a railing? : A Lot 6 Click Score: 15    End of Session Equipment Utilized During Treatment: Gait belt;Oxygen Activity Tolerance: Treatment limited secondary to medical complications (Comment) Patient left: in chair;with call bell/phone within reach;with chair alarm set Nurse Communication: Mobility status;Other (comment) (O2 sats) PT Visit Diagnosis: Unsteadiness on feet (R26.81);Muscle weakness (generalized) (M62.81);Difficulty in walking, not elsewhere classified (R26.2)    Time: 2620-3559 PT Time Calculation (min) (ACUTE ONLY): 38 min   Charges:   PT Evaluation $PT Eval Moderate Complexity: 1 Mod PT Treatments $Gait Training: 8-22 mins       Ramond Dial 08/12/2020, 8:08 PM  Mee Hives, PT MS Acute Rehab Dept. Number: [place] and [place]

## 2020-08-12 NOTE — Progress Notes (Signed)
[facility] Gastroenterology Progress Note  [name] 74 y.o. 06/26/1946  CC: Upper GI bleeding, decompensated cirrhosis  Subjective: Patient denies any complaints.  Has not had a bowel movement today.  Denies abdominal pain, nausea, or vomiting.  ROS : Review of Systems  Cardiovascular: Negative for chest pain and palpitations.  Gastrointestinal: Negative for abdominal pain, blood in stool, constipation, diarrhea, heartburn, melena, nausea and vomiting.   Objective: Vital signs in last 24 hours: Vitals:   08/12/20 1022 08/12/20 1156  BP:    Pulse: 69   Resp: 18   Temp:  98.4 F (36.9 C)  SpO2: 99%     Physical Exam:  General:  Alert, cooperative, no distress, appears stated age  Head:  Normocephalic, without obvious abnormality, atraumatic  Eyes:  Anicteric sclera, EOMs intact  Lungs:   Clear to auscultation bilaterally, respirations unlabored  Heart:  Regular rate and rhythm, S1, S2 normal  Abdomen:   Soft, non-tender, non-distended, bowel sounds active all four quadrants   Extremities: 3+ bilateral lower extremity edema  Pulses: 2+ and symmetric    Lab Results: Recent Labs    08/11/20 0306 08/12/20 0150  NA 139 138  K 3.3* 3.8  CL 103 102  CO2 26 26  GLUCOSE 113* 107*  BUN 21 21  CREATININE 1.25* 1.30*  CALCIUM 8.3* 8.2*   Recent Labs    08/11/20 0306 08/12/20 0150  AST 23 22  ALT 16 16  ALKPHOS 106 100  BILITOT 1.5* 1.7*  PROT 6.3* 6.2*  ALBUMIN 3.2* 3.3*   Recent Labs    08/11/20 0306 08/11/20 1521 08/12/20 0150  WBC 4.3  --  4.9  NEUTROABS 3.4  --  4.0  HGB 7.0* 8.2* 7.6*  HCT 25.0* 29.2* 25.5*  MCV 82.0  --  80.4  PLT 126*  --  124*   Recent Labs    08/10/20 1525  LABPROT 14.3  INR 1.2     Assessment; Upper GI bleeding.  EGD 12/13 revealed portal hypertensive gastropathy, friable gastric mucosa, type 1 isolated gastric varices (IGV1, varices located in the fundus), without bleeding,  grade I esophageal varices. -Hgb 7.6,  decreased from 8.2 yesterday -BUN has normalized, now 21, suggestive of resolution of upper GI bleeding -Stable BP, HR  Decompensated cirrhosis.  MELD 11 as of 08/10/20  Hepatocellular carcinoma  Plan: Continue octreotide and protonix drips for a total of 72 hours.  If Hgb remains stable, plan to discontinue drips tomorrow and start Protonix 40 mg IV BID.  Continue to monitor H&H with transfusion as needed to maintain Hgb >7.  Advance diet as tolerated.  [facility] will follow.  Salley Slaughter PA-C 08/12/2020, 12:02 PM  Contact #  [phone]

## 2020-08-12 NOTE — Progress Notes (Signed)
This chaplain is present for notarizing of Pt. HCPOA, after education, with the notary and two witnesses.  The Pt was sitting up on the edge of the bed and able to share the role of the HCPOA with the  chaplain.  The Pt. named Livia Snellen as his healthcare agent.  The chaplain returned the original Advance Directive to the Pt. along with one copy. The chaplain scanned the Advance Directive to the Pt. EMR.  This chaplain is available for F/U spiritual care as needed.

## 2020-08-12 NOTE — Progress Notes (Signed)
PROGRESS NOTE    [name]  KDT:[id number] DOB: 11-01-1945 DOA: 08/09/2020 PCP: [name], MD   Brief Narrative:    Assessment & Plan:   Active Problems:   Type 2 diabetes with complication (HCC)   OSA (obstructive sleep apnea)   Hyperlipidemia with target LDL less than 70   GI bleed   CKD (chronic kidney disease), stage III (HCC)   (HFpEF) heart failure with preserved ejection fraction (HCC)   Hepatocellular carcinoma (HCC)  Acute GI Bleed in the setting of Portal Gastropathy History of Gastric Varices -Patient was FOBT positive, with melanotic stools -Hemoglobin dropped to 5.5 on presentation, baseline around 8; Now Hgb/Hct is 7.6/25.5  -Last EGD in August/21 showed gastric varices, nonbleeding -GI consulted, EGD on 08/10/2020 showed severe portal hypertensive gastropathy in the stomach with diffuse friable mucosa with contact bleeding, Grade 1 esophageal varices and type I isolated gastric varices was noted with no bleeding or stigmata of recent bleeding -Patient has not had any recurrent bleeding and hemoglobin has now dropped to 7.6 as above -Is recommending completing 72 hours of octreotide (to end 08/13/20)and then discontinue and start the patient's Protonix drip and change to IV Protonix daily and recommending continue PPI daily as an outpatient-diet has been advanced to a low sodium diet and recommending the patient be discharged in the next 1 to 2 days if stable -Patient to be followed by Dr. Michail Sermon in the outpatient setting in 4 weeks -Patient is stable from a GI perspective but given his heart failure we will have cardiology evaluate prior to safe discharge disposition -Continue Monitor closely  Acute on chronic iron deficiency blood loss anemia -Likely 2/2 above -Hemoglobin dropped to 5.5 on presentation, baseline around 8; Now Hgb/Hct is 7.6/25.5  -S/p 3 units of PRBC on 08/10/2020, another 1 unit of PRBC on 08/11/2020 -Continue to monitor for  signs and symptoms of bleeding; currently no overt bleeding noted Repeat CBC in a.m.  Decompensated alcoholic liver cirrhosis Hyperbilirubinemia -Noted to have an anasarca, oriented -LFTs WNL, T bili 1.4 -> 1.7, will continue to Trend  -RUQ Ultrasound, unchanged, no ascites -Continue IV Lasix 40 mg every 12 -Hold home Torsemide, Aldactone for now and will resume once he is more diuresed; cardiology consulted for his volume overload  Thrombocytopenia -Patient's Platelet Count went from 126 -> 124 -Continue to Monitor for S/Sx of Bleeding -Repeat CBC in AM   Acute on Chronic Diastolic HF -Noted Anasarca -BNP 326 -Chest x-ray showed increased pulmonary edema -Troponin WNL, EKG with no acute ST changes -Last echo done on 04/2020 showed EF of 60 to 65% with grade 3 diastolic dysfunction -Continue IV Lasix 40 mg q12h, hold home losartan for now -Cardiology consulted on 08/11/2020 but consult not put in so will do it now to have them assist with diuresis -Strict I's and O's, daily weights; Patient is Now -959.2 Liters; weight is up 17 pounds from the last time it was checked -We will advance his diet to low-sodium diet with 2 g sodium restriction and restriction to 1500 mL as well -Cardiology consulted for further assistance and recommendations  -Repeat chest x-ray in a.m.  Hypokalemia -Patient's K+ was 3.3 and was 3.8 -Continue to Monitor and Replete as Necessary -Repeat CMP in the AM   History of Asthma -Currently not in exacerbation X-continue with DuoNeb 3 mils twice daily, as well as albuterol neb 2.5 mg every 2 hours as needed for shortness of breath -Supplemental oxygen via nasal cannula -SpO2:  91 % O2 Flow Rate (L/min): 1 L/min -Patient will need an ambulatory home O2 screen prior to discharge  CAD -Involving the native coronary artery of the heart without angina pectoris-he has severe left main disease on his cath in 2015 and underwent CABG x3 -Continue with  nitroglycerin 0.4 mg sublingual every 5 minutes as needed chest pain, atorvastatin 40 once p.o. daily -We will consult cardiology for further assistance but is losartan has been held for now  GERD -C/w PPI as above  CKD Stage II -Creatinine stable at baseline -Continue to Monitor as Patient is getting diuresis -BUN/Cr now gone 27/1.25 -> 31/1.20 -> 21/1.25 -> 21/1.30 -Avoid Nephrotoxic Medications, Contrast Dyes, Avoid Hypotension and Renally adjust Medications -Continue to Monitor and Trend -Repeat CMP  Diabetes Mellitus Type 2 -Last A1c 5.3% on 08/10/20 -Continue SSI, Accu-Cheks, Lantus, hypoglycemic protocol -Hold Home Regimen -CBGs have been ranging from 92-210  Hypertension -BP was soft yesterday  -His Losartan had been held; [place] can resume in the AM  -Continue to Monitor BP per Protocol -Last BP was 132/57  Hepatocellular Carcinoma -Patient currently follows with IR; patient had chemoembolization in November -Continue outpatient follow-up  HLD -C/w Atorvastatin 40 mg po Daily   OSA -Noncompliant with CPAP  Obesity -Complicates overall care and prognosis -Estimated body mass index is 38.35 kg/m as calculated from the following:   Height as of this encounter: 5\' 8"  (1.727 m).   Weight as of this encounter: 114.4 kg. -Weight Loss and Dietary Counseling given  Lifestyle modification advised  [place] -Patient with multiple comorbidities, overall poor prognosis -Palliative consulted-initially agreed to be DNR, but changed his mind and wants to be full code  DVT prophylaxis: SCDs Code Status: FULL CODE  Family Communication: No family present at bedside  Disposition Plan: Ending further clinical improvement as well as clearance by gastroenterology and cardiology  Status is: Inpatient  Remains inpatient appropriate because:Unsafe d/c plan, IV treatments appropriate due to intensity of illness or inability to take PO and Inpatient level of care appropriate  due to severity of illness   Dispo: The patient is from: Home              Anticipated d/c is to: Home              Anticipated d/c date is: 2 days              Patient currently is not medically stable to d/c.  Consultants:   Gastroenterology  Palliative Care Medicine  Cardiology    Procedures: EGD Findings:      Grade I varices were found in the lower third of the esophagus. No       stigmata of bleeding.      The Z-line was regular and was found 45 cm from the incisors.      Severe portal hypertensive gastropathy was found in the cardia, in the       gastric fundus and in the gastric body.      Diffuse friable mucosa with contact bleeding was found in the cardia, in       the gastric fundus and in the gastric body.      Type 1 isolated gastric varices (IGV1, varices located in the fundus)       with no bleeding were found in the gastric fundus. There were no       stigmata of recent bleeding. They were medium in largest diameter.      The examined duodenum  was normal. Impression:               - Grade I esophageal varices.                           - Z-line regular, 45 cm from the incisors.                           - Portal hypertensive gastropathy.                           - Friable gastric mucosa.                           - Type 1 isolated gastric varices (IGV1, varices                            located in the fundus), without bleeding.                           - Normal examined duodenum.                           - No specimens collected. Recommendation:           - Clear liquid diet.                           - Observe patient's clinical course.                           - Continue Octreotide and Protonix drips.  Antimicrobials:  Anti-infectives (From admission, onward)   None        Subjective: Seen and examined at bedside and still remains significantly volume overloaded.  Admits to minimal  shortness of breath.  Had some abdominal pain but states is  improved.  No nausea or vomiting.  Denies any other concerns or complaints at this time.  Continues to have anasarca and lower extremity swelling so cardiology has been consulted for further assistance with diuresis  Objective: Vitals:   08/11/20 2003 08/11/20 2301 08/12/20 0304 08/12/20 0732  BP:  (!) 154/69 (!) 127/55   Pulse:  60 75   Resp:  18    Temp:  98.3 F (36.8 C) 98.5 F (36.9 C) 98.4 F (36.9 C)  TempSrc:  Oral Oral   SpO2: 100%  96%   Weight:      Height:        Intake/Output Summary (Last 24 hours) at 08/12/2020 0818 Last data filed at 08/12/2020 6967 Gross per 24 hour  Intake 2322.61 ml  Output 300 ml  Net 2022.61 ml   Filed Weights   08/09/20 1944 08/11/20 0509  Weight: 106.6 kg 114.4 kg   Examination: Physical Exam:  Constitutional: WN/WD obese chronically ill-appearing Caucasian male currently in no acute distress appears calm but slightly uncomfortable Eyes: Lids and conjunctivae normal, sclerae anicteric  ENMT: External Ears, Nose appear normal. Grossly normal hearing. Mucous membranes are moist.  Neck: Appears normal, supple, no cervical masses, normal ROM, no appreciable thyromegaly; some JVD Respiratory: Diminished to auscultation bilaterally with coarse breath sounds and some crackles, no wheezing, rales, rhonchi. Normal respiratory  effort and patient is not tachypenic. No accessory muscle use.  Wearing supplemental oxygen via nasal cannula Cardiovascular: RRR, no murmurs / rubs / gallops. S1 and S2 auscultated.  2+ lower extremity edema bilaterally Abdomen: Soft, slightly tender, distended secondary body habitus. Bowel sounds positive.  GU: Deferred. Musculoskeletal: No clubbing / cyanosis of digits/nails. No joint deformity upper and lower extremities.  Skin: No rashes, lesions, ulcer a limited skin evaluation s. No induration; Warm and dry.  Neurologic: CN 2-12 grossly intact with no focal deficits. Romberg sign and cerebellar reflexes not  assessed.  Psychiatric: Normal judgment and insight. Alert and oriented x 3. Normal mood and appropriate affect.   Data Reviewed: I have personally reviewed following labs and imaging studies  CBC: Recent Labs  Lab 08/09/20 1733 08/10/20 0605 08/10/20 1525 08/10/20 2140 08/11/20 0306 08/11/20 1521 08/12/20 0150  WBC 3.5*  --   --   --  4.3  --  4.9  NEUTROABS  --   --   --   --  3.4  --  4.0  HGB 5.5*   < > 8.5* 7.6* 7.0* 8.2* 7.6*  HCT 20.0*   < > 28.6* 26.9* 25.0* 29.2* 25.5*  MCV 79.7*  --   --   --  82.0  --  80.4  PLT 138*  --   --   --  126*  --  124*   < > = values in this interval not displayed.   Basic Metabolic Panel: Recent Labs  Lab 08/09/20 1733 08/10/20 0548 08/10/20 1322 08/11/20 0306 08/12/20 0150  NA 136 139 141 139 138  K 3.6 3.3* 3.6 3.3* 3.8  CL 102 102 101 103 102  CO2 23 26  --  26 26  GLUCOSE 94 79 87 113* 107*  BUN 30* 27* 31* 21 21  CREATININE 1.23 1.25* 1.20 1.25* 1.30*  CALCIUM 8.5* 8.6*  --  8.3* 8.2*   GFR: Estimated Creatinine Clearance: 61.2 mL/min (A) (by C-G formula based on SCr of 1.3 mg/dL (H)). Liver Function Tests: Recent Labs  Lab 08/10/20 0548 08/11/20 0306 08/12/20 0150  AST 22 23 22   ALT 15 16 16   ALKPHOS 106 106 100  BILITOT 1.4* 1.5* 1.7*  PROT 6.5 6.3* 6.2*  ALBUMIN 3.3* 3.2* 3.3*   No results for input(s): LIPASE, AMYLASE in the last 168 hours. No results for input(s): AMMONIA in the last 168 hours. Coagulation Profile: Recent Labs  Lab 08/10/20 1525  INR 1.2   Cardiac Enzymes: No results for input(s): CKTOTAL, CKMB, CKMBINDEX, TROPONINI in the last 168 hours. BNP (last 3 results) No results for input(s): PROBNP in the last 8760 hours. HbA1C: Recent Labs    08/10/20 0609  HGBA1C 5.3   CBG: Recent Labs  Lab 08/11/20 0750 08/11/20 1317 08/11/20 1642 08/11/20 1955 08/12/20 0735  GLUCAP 79 115* 168* 157* 92   Lipid Profile: No results for input(s): CHOL, HDL, LDLCALC, TRIG, CHOLHDL, LDLDIRECT  in the last 72 hours. Thyroid Function Tests: No results for input(s): TSH, T4TOTAL, FREET4, T3FREE, THYROIDAB in the last 72 hours. Anemia Panel: No results for input(s): VITAMINB12, FOLATE, FERRITIN, TIBC, IRON, RETICCTPCT in the last 72 hours. Sepsis Labs: No results for input(s): PROCALCITON, LATICACIDVEN in the last 168 hours.  Recent Results (from the past 240 hour(s))  Resp Panel by RT-PCR (Flu A&B, Covid) Nasopharyngeal Swab     Status: None   Collection Time: 08/09/20  7:47 PM   Specimen: Nasopharyngeal Swab; Nasopharyngeal(NP) swabs in vial  transport medium  Result Value Ref Range Status   SARS Coronavirus 2 by RT PCR NEGATIVE NEGATIVE Final    Comment: (NOTE) SARS-CoV-2 target nucleic acids are NOT DETECTED.  The SARS-CoV-2 RNA is generally detectable in upper respiratory specimens during the acute phase of infection. The lowest concentration of SARS-CoV-2 viral copies this assay can detect is 138 copies/mL. A negative result does not preclude SARS-Cov-2 infection and should not be used as the sole basis for treatment or other patient management decisions. A negative result may occur with  improper specimen collection/handling, submission of specimen other than nasopharyngeal swab, presence of viral mutation(s) within the areas targeted by this assay, and inadequate number of viral copies(<138 copies/mL). A negative result must be combined with clinical observations, patient history, and epidemiological information. The expected result is Negative.  Fact Sheet for Patients:  EntrepreneurPulse.com.au  Fact Sheet for Healthcare Providers:  IncredibleEmployment.be  This test is no t yet approved or cleared by the [place] FDA and  has been authorized for detection and/or diagnosis of SARS-CoV-2 by FDA under an Emergency Use Authorization (EUA). This EUA will remain  in effect (meaning this test can be used) for the duration of  the COVID-19 declaration under Section 564(b)(1) of the Act, 21 U.S.C.section 360bbb-3(b)(1), unless the authorization is terminated  or revoked sooner.       Influenza A by PCR NEGATIVE NEGATIVE Final   Influenza B by PCR NEGATIVE NEGATIVE Final    Comment: (NOTE) The Xpert Xpress SARS-CoV-2/FLU/RSV plus assay is intended as an aid in the diagnosis of influenza from Nasopharyngeal swab specimens and should not be used as a sole basis for treatment. Nasal washings and aspirates are unacceptable for Xpert Xpress SARS-CoV-2/FLU/RSV testing.  Fact Sheet for Patients: EntrepreneurPulse.com.au  Fact Sheet for Healthcare Providers: IncredibleEmployment.be  This test is not yet approved or cleared by the [place] FDA and has been authorized for detection and/or diagnosis of SARS-CoV-2 by FDA under an Emergency Use Authorization (EUA). This EUA will remain in effect (meaning this test can be used) for the duration of the COVID-19 declaration under Section 564(b)(1) of the Act, 21 U.S.C. section 360bbb-3(b)(1), unless the authorization is terminated or revoked.  Performed at [facility], [place] [address], [place], [place] [address]      RN Pressure Injury Documentation:     Estimated body mass index is 38.35 kg/m as calculated from the following:   Height as of this encounter: 5\' 8"  (1.727 m).   Weight as of this encounter: 114.4 kg.  Malnutrition Type:   Malnutrition Characteristics:   Nutrition Interventions:     Radiology Studies: US Abdomen Limited RUQ (LIVER/GB)  Result Date: 08/10/2020 CLINICAL DATA:  Cirrhosis, ascites search EXAM: LIMITED ABDOMEN ULTRASOUND FOR ASCITES TECHNIQUE: Limited ultrasound survey for ascites was performed in all four abdominal quadrants. COMPARISON:  MR abdomen, 05/13/2020 FINDINGS: No evidence of ascites.  Small right pleural effusion. Cirrhotic morphology of the liver. There is a hyperechoic  subcapsular mass of the right lobe of the liver measuring approximately 3.3 x 2.9 x 2.5 cm, as seen and better characterized by prior MR. IMPRESSION: 1. No evidence of ascites. 2. Small right pleural effusion. 3. Cirrhotic morphology of the liver. Hyperechoic subcapsular mass of the right lobe of the liver, better characterized as hepatocellular carcinoma by prior MR. Electronically Signed   By: Eddie Candle M.D.   On: 08/10/2020 12:32   Scheduled Meds: . sodium chloride   Intravenous Once  .  atorvastatin  40 mg Oral Daily  . furosemide  40 mg Intravenous BID  . gabapentin  100 mg Oral BID  . insulin aspart  0-20 Units Subcutaneous TID WC  . insulin glargine  10 Units Subcutaneous QHS  . ipratropium-albuterol  3 mL Nebulization BID   Continuous Infusions: . sodium chloride Stopped (08/10/20 0249)  . octreotide  (SANDOSTATIN)    IV infusion 50 mcg/hr (08/12/20 8333)  . pantoprozole (PROTONIX) infusion 8 mg/hr (08/12/20 0646)     LOS: 3 days   Kerney Elbe, DO [facility] PAGER is on AMION  If 7PM-7AM, please contact night-coverage [email or web address]

## 2020-08-12 NOTE — Evaluation (Signed)
Occupational Therapy Evaluation Patient Details Name: [name] MRN: [id number] DOB: 1946-01-30 Today's Date: 08/12/2020    History of Present Illness 74 yo male with onset of anasarca and pleural effusion was admitted for symptoms of GI bleed and had EGD on 12.13.  He is anemic at 5.5 initially, SOB and had epigastric pain.  EGD located gastric nonbleeding varices with portal hypertensive gastropathy.  PMHx:  liver mass, CHF, CAD, cirrhosis, transfusions, OSA, anemia, ankle fracture ORIF   Clinical Impression   Pt admitted with the above diagnoses and presents with below problem list. Pt will benefit from continued acute OT to address the below listed deficits and maximize independence with basic ADLs prior to d/c home where he lives with several family members. PTA pt reports he was independent with ADLs. Pt currently mod A with LB ADLs and toilet transfers, min guard assist to walk one lap in the room, utilized rw and 1L O2. Pt with O2 sat 91 after short walk, recovered to mid 90s after a minute or so.       Follow Up Recommendations  Home health OT;Supervision/Assistance - 24 hour    Equipment Recommendations  3 in 1 bedside commode    Recommendations for Other Services       Precautions / Restrictions Precautions Precautions: Fall Restrictions Weight Bearing Restrictions: No Other Position/Activity Restrictions: BLE edema; h/o painful L knee      Mobility Bed Mobility               General bed mobility comments: sitting EOB at start of session, recliner at end of session    Transfers Overall transfer level: Needs assistance Equipment used: Rolling walker (2 wheeled) Transfers: Sit to/from Stand Sit to Stand: Mod assist         General transfer comment: mod A to powerup to standing. cues for technique with rw. to/from EOB, to recliner.    Balance Overall balance assessment: Needs assistance Sitting-balance support: No upper extremity supported;Feet  supported Sitting balance-Leahy Scale: Fair Sitting balance - Comments: ate breakfast sitting EOB   Standing balance support: Bilateral upper extremity supported Standing balance-Leahy Scale: Poor Standing balance comment: seeking external support in standing position                           ADL either performed or assessed with clinical judgement   ADL Overall ADL's : Needs assistance/impaired Eating/Feeding: Set up;Sitting   Grooming: Set up;Sitting   Upper Body Bathing: Set up;Sitting   Lower Body Bathing: Moderate assistance;Sit to/from stand;Maximal assistance   Upper Body Dressing : Set up;Sitting   Lower Body Dressing: Moderate assistance;Sit to/from stand;Maximal assistance   Toilet Transfer: Moderate assistance;Ambulation;Comfort height toilet;Grab bars;RW;BSC   Toileting- Clothing Manipulation and Hygiene: Moderate assistance;Sit to/from stand       Functional mobility during ADLs: Min guard;Rolling walker General ADL Comments: Sitting EOB upon arrival of OT/PT. Mod A for sit<>stands, min guard to ambulate within the room. Utilized rw.     Vision         Perception     Praxis      Pertinent Vitals/Pain Pain Assessment: Faces Faces Pain Scale: Hurts even more Pain Location: L knee in weightbearing position Pain Descriptors / Indicators: Aching;Sore Pain Intervention(s): Monitored during session;Limited activity within patient's tolerance;Repositioned     Hand Dominance Right   Extremity/Trunk Assessment Upper Extremity Assessment Upper Extremity Assessment: Generalized weakness   Lower Extremity Assessment Lower Extremity Assessment:  Defer to PT evaluation       Communication Communication Communication: HOH   Cognition Arousal/Alertness: Awake/alert Behavior During Therapy: WFL for tasks assessed/performed Overall Cognitive Status: No family/caregiver present to determine baseline cognitive functioning                                  General Comments: Tangential responses. When asked, pt endorsed being somewhat HOH. No family present. Likes to joke a bit.   General Comments  On 1L O2 throughout session. O2 sats 91 after walking in the room. REcovered to mid 90s after a minute or so,    Exercises     Shoulder Instructions      Home Living Family/patient expects to be discharged to:: Private residence Living Arrangements: Spouse/significant other;Children Available Help at Discharge: Family;Available 24 hours/day Type of Home: House Home Access: Stairs to enter     Home Layout: Two level;Able to live on main level with bedroom/bathroom     Bathroom Shower/Tub: Teacher, early years/pre: Standard     Home Equipment: Environmental consultant - 2 wheels;Kasandra Knudsen - single point   Additional Comments: lives with several family members. Spouse is on supplemental O2.      Prior Functioning/Environment Level of Independence: Independent        Comments: walks without AD, reports sit to stand is challenging        OT Problem List: Decreased strength;Decreased activity tolerance;Impaired balance (sitting and/or standing);Decreased knowledge of use of DME or AE;Decreased knowledge of precautions;Cardiopulmonary status limiting activity;Obesity;Pain;Increased edema      OT Treatment/Interventions: Self-care/ADL training;Therapeutic exercise;Energy conservation;DME and/or AE instruction;Therapeutic activities;Patient/family education;Balance training    OT Goals(Current goals can be found in the care plan section) Acute Rehab OT Goals Patient Stated Goal: decreased edema in BLE. OT Goal Formulation: With patient Time For Goal Achievement: 08/26/20 Potential to Achieve Goals: Good ADL Goals Pt Will Perform Lower Body Bathing: with modified independence;sit to/from stand Pt Will Perform Lower Body Dressing: with modified independence;sit to/from stand Pt Will Transfer to Toilet: with modified  independence;ambulating Pt Will Perform Toileting - Clothing Manipulation and hygiene: with modified independence;sit to/from stand  OT Frequency: Min 2X/week   Barriers to D/C:            Co-evaluation PT/OT/SLP Co-Evaluation/Treatment: Yes Reason for Co-Treatment: For patient/therapist safety   OT goals addressed during session: ADL's and self-care      AM-PAC OT "6 Clicks" Daily Activity     Outcome Measure Help from another person eating meals?: None Help from another person taking care of personal grooming?: None Help from another person toileting, which includes using toliet, bedpan, or urinal?: A Lot Help from another person bathing (including washing, rinsing, drying)?: A Lot Help from another person to put on and taking off regular upper body clothing?: None Help from another person to put on and taking off regular lower body clothing?: A Lot 6 Click Score: 18   End of Session Equipment Utilized During Treatment: Gait belt;Rolling walker;Oxygen  Activity Tolerance: Patient limited by pain;Patient limited by fatigue Patient left: with call bell/phone within reach;in chair;with chair alarm set  OT Visit Diagnosis: Unsteadiness on feet (R26.81);Muscle weakness (generalized) (M62.81);Pain Pain - Right/Left: Left Pain - part of body: Knee                Time: 1884-1660 OT Time Calculation (min): 26 min Charges:  OT General Charges $OT Visit: 1  Visit OT Evaluation $OT Eval Low Complexity: [place], OT Acute Rehabilitation Services Pager: [phone] Office: [phone]   Hortencia Pilar 08/12/2020, 2:15 PM

## 2020-08-12 NOTE — Progress Notes (Signed)
Daily Progress Note   Patient Name: [name]       Date: 08/12/2020 DOB: 10-Dec-1945  Age: 74 y.o. MRN#: [id number] Attending Physician: Kerney Elbe, DO Primary Care Physician: [name], MD Admit Date: 08/09/2020  Reason for Follow-up: continued GOC discussion  Subjective: Patient is sitting on the side of the bed. RN is in the process of turning off the protonix infusion - he is transitioning to intermittent IV protonix. He reports feeling "much better" and is hopeful to discharge home in a few days. He reports mobilizing with PT earlier today.  Son [name] is at the bedside. Patient completed documents earlier today naming [name] as his HCPOA.   I again discuss code status with patient and son. Confirmed that in the event of cardiopulmonary arrest, he would want resuscitation measures including CPR and intubation. He would be ok with intubation and mechanical ventilation only for a limited time trial. If there were no improvement, he states he would want to be "let go". He would not want prolonged mechanical ventilation.   We discussed his diagnosis of [place] and current treatment plan. [name] reports he is in the process of trying to schedule a follow-up appointment and imaging. Discussed that patient would be eligible for hospice if he decides not to pursue additional treatment at some point.  I completed a MOST form today. The patient and family outlined their wishes for the following treatment decisions:  Cardiopulmonary Resuscitation: Attempt Resuscitation (CPR)  Medical Interventions: Full Scope of Treatment: Use intubation, advanced airway interventions, mechanical ventilation, cardioversion as indicated, medical treatment, IV fluids, etc, also provide comfort  measures. Transfer to the hospital if indicated  Antibiotics: Antibiotics if indicated  IV Fluids: IV fluids for a defined trial period  Feeding Tube: No feeding tube    Length of Stay: 3  Current Medications: Scheduled Meds:  . sodium chloride   Intravenous Once  . atorvastatin  40 mg Oral Daily  . furosemide  40 mg Intravenous BID  . gabapentin  100 mg Oral BID  . insulin aspart  0-20 Units Subcutaneous TID WC  . insulin glargine  10 Units Subcutaneous QHS  . ipratropium-albuterol  3 mL Nebulization BID  . pantoprazole (PROTONIX) IV  40 mg Intravenous Q24H    Continuous Infusions: . sodium chloride Stopped (08/10/20  7371)  . octreotide  (SANDOSTATIN)    IV infusion 50 mcg/hr (08/12/20 1701)    PRN Meds: acetaminophen **OR** acetaminophen, albuterol, morphine injection, nitroGLYCERIN, senna, sodium chloride  Physical Exam Constitutional:      General: He is not in acute distress.    Appearance: He is obese.     Comments: Chronically ill-appearing  Cardiovascular:     Rate and Rhythm: Normal rate and regular rhythm.  Pulmonary:     Effort: Pulmonary effort is normal.  Musculoskeletal:     Right lower leg: Edema present.     Left lower leg: Edema present.  Neurological:     Mental Status: He is alert and oriented to person, place, and time.             Vital Signs: BP (!) 132/57 (BP Location: Right Arm)   Pulse 60   Temp 98.7 F (37.1 C) (Oral)   Resp 17   Ht 5\' 8"  (1.727 m)   Wt 114.4 kg   SpO2 91%   BMI 38.35 kg/m  SpO2: SpO2: 91 % O2 Device: O2 Device: Room Air O2 Flow Rate: O2 Flow Rate (L/min): 1 L/min  Intake/output summary:   Intake/Output Summary (Last 24 hours) at 08/12/2020 1813 Last data filed at 08/12/2020 1509 Gross per 24 hour  Intake 1879.67 ml  Output 1150 ml  Net 729.67 ml   LBM: Last BM Date: 08/10/20 Baseline Weight: Weight: 106.6 kg Most recent weight: Weight: 114.4 kg       Palliative Assessment/Data: PPS  60%     Patient Active Problem List   Diagnosis Date Noted  . Hepatocellular carcinoma ([place]) 08/09/2020  . Routine general medical examination at a health care facility 05/06/2020  . Iron deficiency anemia   . Symptomatic anemia 04/15/2020  . Pulmonary hypertension, unspecified ([place]) 03/11/2020  . Asthma, mild intermittent 12/27/2019  . Alcoholic cirrhosis of liver with ascites ([place]) 12/27/2019  . (HFpEF) heart failure with preserved ejection fraction ([place]) 12/24/2018  . CKD (chronic kidney disease), stage III ([place]) 12/18/2017  . Peptic ulcer disease 12/18/2017  . GI bleed 09/29/2017  . Morbid obesity ([place]) 02/05/2016  . S/P CABG x 3 03/31/2014  . Hepatic cirrhosis ([place]) 02/27/2014  . Hyperlipidemia with target LDL less than 70 06/29/2012  . Thrombocytopenia ([place]) 06/29/2012  . Coronary artery disease involving native coronary artery of native heart without angina pectoris   . OSA (obstructive sleep apnea)   . Type 2 diabetes with complication ([place]) 02/22/9484  . GERD without esophagitis 08/04/2010  . History of gout   . Hypertensive heart disease     Palliative Care Assessment & Plan   HPI/Patient Profile: 74 y.o. male  with past medical history of liver cirrhosis with known varices, hepatocellular carcinoma, CAD s/p CABG, COPD, HFpEF, and diabetes mellitus type 2. He presented to the emergency department on 08/09/2020 with shortness of breath, increased peripheral edema, and black stools.  ED Course: Vital signs stable. Labs revealed creatinine 1.23, BNP 326.3, hemoglobin 5.5 (down from baseline around 8). He was given IV lasix, transfused 2 units PRBCs, and started on IV protonix infusion. He was admitted to [facility] for management of GI bleed and acute on chronic HFpEF.   EGD done 12/13 showed grade 1 esophageal varices, portal hypertensive gastropathy, friable gastric mucosa, and type 1 isolated gastric varices without bleeding.   Patient initially diagnosed with right  hepatocellular carcinoma in September 2021 (confirmed with contrast MRI); his BCLC is intermediate stage. He is followed  by interventional radiology and has been receiving radioembolization treatment.     Assessment: - decompensated alcoholic liver cirrhosis - history of gastric varices - severe poral hypertensive gastropathy - acute on chronic blood loss anemia - acute on chronic diastolic HF - hepatocellular carcinoma, s/p radioembolization  Recommendations/Plan: - full code full scope - continue current medical treatment - MOST form completed, copy to be scanned into EMR and original placed on shadow chart (to be given to patient at discharge) - goal of care is to return home and regain previous functional status - PMT will continue to follow   Goals of Care and Additional Recommendations: Limitations on Scope of Treatment: no prolonged mechanical intubation, no tracheostomy, no artificial feeding  Code Status: full  Prognosis: Difficult to determine, but poor overall  Discharge Planning: Home with [facility] was discussed with nursing  Thank you for allowing the Palliative Medicine Team to assist in the care of this patient.   Total Time 35 minutes Prolonged Time Billed  no       Greater than 50%  of this time was spent counseling and coordinating care related to the above assessment and plan.  Lavena Bullion, NP  Please contact Palliative Medicine Team phone at [phone] for questions and concerns.

## 2020-08-13 DIAGNOSIS — I2510 Atherosclerotic heart disease of native coronary artery without angina pectoris: Secondary | ICD-10-CM

## 2020-08-13 DIAGNOSIS — I5033 Acute on chronic diastolic (congestive) heart failure: Secondary | ICD-10-CM

## 2020-08-13 DIAGNOSIS — N1831 Chronic kidney disease, stage 3a: Secondary | ICD-10-CM

## 2020-08-13 LAB — COMPREHENSIVE METABOLIC PANEL
ALT: 15 U/L (ref 0–44)
AST: 20 U/L (ref 15–41)
Albumin: 3.3 g/dL — ABNORMAL LOW (ref 3.5–5.0)
Alkaline Phosphatase: 98 U/L (ref 38–126)
Anion gap: 9 (ref 5–15)
BUN: 20 mg/dL (ref 8–23)
CO2: 26 mmol/L (ref 22–32)
Calcium: 8.2 mg/dL — ABNORMAL LOW (ref 8.9–10.3)
Chloride: 102 mmol/L (ref 98–111)
Creatinine, Ser: 1.36 mg/dL — ABNORMAL HIGH (ref 0.61–1.24)
GFR, Estimated: 55 mL/min — ABNORMAL LOW (ref >60)
Glucose, Bld: 134 mg/dL — ABNORMAL HIGH (ref 70–99)
Potassium: 4 mmol/L (ref 3.5–5.1)
Sodium: 137 mmol/L (ref 135–145)
Total Bilirubin: 1.6 mg/dL — ABNORMAL HIGH (ref 0.3–1.2)
Total Protein: 6.5 g/dL (ref 6.5–8.1)

## 2020-08-13 LAB — CBC WITH DIFFERENTIAL/PLATELET
Abs Immature Granulocytes: 0.01 10*3/uL (ref 0.00–0.07)
Basophils Absolute: 0 10*3/uL (ref 0.0–0.1)
Basophils Relative: 0 %
Eosinophils Absolute: 0.1 10*3/uL (ref 0.0–0.5)
Eosinophils Relative: 1 %
HCT: 28.9 % — ABNORMAL LOW (ref 39.0–52.0)
Hemoglobin: 8.2 g/dL — ABNORMAL LOW (ref 13.0–17.0)
Immature Granulocytes: 0 %
Lymphocytes Relative: 7 %
Lymphs Abs: 0.3 10*3/uL — ABNORMAL LOW (ref 0.7–4.0)
MCH: 23.2 pg — ABNORMAL LOW (ref 26.0–34.0)
MCHC: 28.4 g/dL — ABNORMAL LOW (ref 30.0–36.0)
MCV: 81.9 fL (ref 80.0–100.0)
Monocytes Absolute: 0.4 10*3/uL (ref 0.1–1.0)
Monocytes Relative: 9 %
Neutro Abs: 3.7 10*3/uL (ref 1.7–7.7)
Neutrophils Relative %: 83 %
Platelets: 100 10*3/uL — ABNORMAL LOW (ref 150–400)
RBC: 3.53 MIL/uL — ABNORMAL LOW (ref 4.22–5.81)
RDW: 19.3 % — ABNORMAL HIGH (ref 11.5–15.5)
WBC: 4.5 10*3/uL (ref 4.0–10.5)
nRBC: 0 % (ref 0.0–0.2)

## 2020-08-13 LAB — MAGNESIUM: Magnesium: 2 mg/dL (ref 1.7–2.4)

## 2020-08-13 LAB — GLUCOSE, CAPILLARY
Glucose-Capillary: 73 mg/dL (ref 70–99)
Glucose-Capillary: 106 mg/dL — ABNORMAL HIGH (ref 70–99)
Glucose-Capillary: 186 mg/dL — ABNORMAL HIGH (ref 70–99)
Glucose-Capillary: 159 mg/dL — ABNORMAL HIGH (ref 70–99)

## 2020-08-13 LAB — PHOSPHORUS: Phosphorus: 3.3 mg/dL (ref 2.5–4.6)

## 2020-08-13 MED ORDER — FUROSEMIDE 10 MG/ML IJ SOLN
80.0000 mg | Freq: Two times a day (BID) | INTRAMUSCULAR | Status: DC
  Administered 2020-08-13 – 2020-08-16 (×6): 80 mg via INTRAVENOUS
  Administered 2020-08-16: 40 mg via INTRAVENOUS
  Administered 2020-08-17 – 2020-08-18 (×3): 80 mg via INTRAVENOUS
  Filled 2020-08-13 (×10): qty 8

## 2020-08-13 NOTE — TOC Initial Note (Signed)
Transition of Care [facility]) - Initial/Assessment Note    Patient Details  Name: [name] MRN: [id number] Date of Birth: [birth year]  [facility] CM/SW Contact:    Joanne Chars, LCSW Phone Number: 08/13/2020, 4:05 PM  Clinical Narrative:    CSW spoke with pt regarding discharge plan.  Pt agreeable to [facility] recommendation.  Choice document given.  Permission given to speak with son and with wife, also to make referral to [facility].  Pt is not vaccinated for covid.  Current equipment in home: walker, cane, bedside commode.               Expected Discharge Plan: [place] Barriers to Discharge: Continued Medical Work up   Patient Goals and CMS Choice Patient states their goals for this hospitalization and ongoing recovery are:: "get back to normal" CMS Medicare.gov Compare Post Acute Care list provided to:: Patient Choice offered to / list presented to : Patient  Expected Discharge Plan and Services Expected Discharge Plan: [place] Choice: [place] arrangements for the past 2 months: Single Family Home                                      Prior Living Arrangements/Services Living arrangements for the past 2 months: Single Family Home Lives with:: Adult Children,Spouse Patient language and need for interpreter reviewed:: Yes Do you feel safe going back to the place where you live?: Yes      Need for Family Participation in Patient Care: No (Comment) Care giver support system in place?: Yes (comment) Current home services: Other (comment) (none) Criminal Activity/Legal Involvement Pertinent to Current Situation/Hospitalization: No - Comment as needed  Activities of Daily Living Home Assistive Devices/Equipment: None ADL Screening (condition at time of admission) Patient's cognitive ability adequate to safely complete daily activities?: Yes Is the patient deaf or have difficulty hearing?:  No Does the patient have difficulty seeing, even when wearing glasses/contacts?: No Does the patient have difficulty concentrating, remembering, or making decisions?: No Patient able to express need for assistance with ADLs?: Yes Does the patient have difficulty dressing or bathing?: No Independently performs ADLs?: Yes (appropriate for developmental age) Does the patient have difficulty walking or climbing stairs?: Yes Weakness of Legs: None Weakness of Arms/Hands: None  Permission Sought/Granted Permission sought to share information with : Facility Retail banker granted to share information with : Yes, Verbal Permission Granted  Share Information with NAME: son [name], Wife Red Christians  Permission granted to share info w AGENCY: HH        Emotional Assessment Appearance:: Appears stated age Attitude/Demeanor/Rapport: Engaged Affect (typically observed): Pleasant,Appropriate Orientation: : Oriented to Self,Oriented to Place,Oriented to  Time,Oriented to Situation Alcohol / Substance Use: Not Applicable Psych Involvement: No (comment)  Admission diagnosis:  Dyspnea on exertion [R06.00] GI bleed [K92.2] Ascites due to alcoholic cirrhosis (HCC) [H29.92] Hypervolemia, unspecified hypervolemia type [E87.70] Anemia, unspecified type [D64.9] Patient Active Problem List   Diagnosis Date Noted  . Hepatocellular carcinoma ([place]) 08/09/2020  . Routine general medical examination at a health care facility 05/06/2020  . Iron deficiency anemia   . Symptomatic anemia 04/15/2020  . Pulmonary hypertension, unspecified ([place]) 03/11/2020  . Asthma, mild intermittent 12/27/2019  . Alcoholic cirrhosis of liver with ascites ([place]) 12/27/2019  . (HFpEF) heart failure with preserved ejection fraction (  [place]) 12/24/2018  . CKD (chronic kidney disease), stage III ([place]) 12/18/2017  . Peptic ulcer disease 12/18/2017  . GI bleed 09/29/2017  . Morbid obesity ([place]) 02/05/2016   . S/P CABG x 3 03/31/2014  . Hepatic cirrhosis ([place]) 02/27/2014  . Hyperlipidemia with target LDL less than 70 06/29/2012  . Thrombocytopenia ([place]) 06/29/2012  . Coronary artery disease involving native coronary artery of native heart without angina pectoris   . OSA (obstructive sleep apnea)   . Type 2 diabetes with complication ([place]) 68/03/8109  . GERD without esophagitis 08/04/2010  . History of gout   . Hypertensive heart disease    PCP:  [name], MD Pharmacy:   [facility] [place], [address] AT [address] [place] [address] Phone: [phone] Fax: [phone]     Social Determinants of Health (SDOH) Interventions    Readmission Risk Interventions Readmission Risk Prevention Plan 08/12/2020 08/12/2020 12/30/2019  Transportation Screening - Complete Complete  PCP or Specialist Appt within 5-7 Days - - -  Not Complete comments - - -  PCP or Specialist Appt within 3-5 Days Complete - -  Home Care Screening - - -  Medication Review (RN CM) - - -  [place] or [place] - Complete -  Social Work Consult for [place] Planning/Counseling - Complete -  Palliative Care Screening - Not Applicable Not Applicable  Medication Review (RN Care Manager) - - Complete  Some recent data might be hidden

## 2020-08-13 NOTE — Consult Note (Addendum)
Cardiology Consultation:   Patient ID: [name] MRN: [id number]; DOB: [birth year]  Admit date: 08/09/2020 Date of Consult: 08/13/2020  Primary Care Provider: [name], MD [facility] Cardiologist: John Moores, MD  [facility] Electrophysiologist:  None    Patient Profile:   [name] is a 74 y.o. male with a PMH of chronic diastolic CHF, CAD s/p CABG 2015, HTN, HLD, DM type 2, OSA, cirrhosis, hepatocellular carcinoma,  CKD stage 3, who presented with GI bleed with acute blood loss anemia, who is being seen today for the evaluation of CHF at the request of [name].  History of Present Illness:   [name] presented to the hospital 08/09/20 with complaints of black stools, SOB, and LE edema. Hemoglobin was down to 5.5 from baseline 8.0. He was admitted to medicine. GI consulted and he underwent EGD 08/10/20 where he was found to have portal HTN gastropathy with friable gastric mucosa, and type 1 isolated gastric varices and esophageal varices without bleeding or stigmata of bleeding. He has received 4 uPRBC this admission with Hgb stable at 8.2 today. He completed 72 hours of octreotide and has been maintained on IV PPI. He was noted to have pulmonary vascular congestion on CXR on admission and felt to have anasarca on exam. He has been diuresing with IV lasix 7m BID with additional doses with blood transfusions. Weight is up from 235lbs on admission to 252lbs as of yesterday if accurate. I&Os are -5058min the past 24 hours and net -[id number] admission. Palliative care was consulted this admission for GOCSaladoscussion given multiple medical issues and poor prognosis, though patient wished to continue full scope of care at this time. Cardiology asked to evaluate for CHF.    He was last evaluated by cardiology at an outpatient visit with Dr. NahAcie Fredrickson2021, at which time he reported worsening SOB and LE edema. He was recommended to repeat an echocardiogram and limit  salty food intake. Echo 04/2020 showed EF 60-65%, G3DD, no RWMA, mildly elevated PA pressures with normal RV size/function, moderate RAE, mild LAE, no significant valvular abnormalities, and mildly dilated ascending aorta.   At the time of this evaluation he reports improvement in symptoms. He feels like he is almost back to baseline aside from residual LE edema. Prior to admission he had SOB, LE edema, and PND, in addition to dark stools which prompted him to get evaluated in the ED. He does report having some dietary indiscretion, eating hot dogs and hamburgers recently. He does not weigh himself regularly. He denies any chest pain prior to or throughout this admission. No complaints of dizziness, lightheadedness, palpitations or syncope.    Past Medical History:  Diagnosis Date  . Arthritis    "joints tighten up sometimes" (03/27/2104)  . Carcinoma of liver, hepatocellular ([place]CC[place]1/09/2019  . CHF (congestive heart failure) ([place] . Chronic lower back pain   . Coronary artery disease    a. 05/2012 Cath/PCI: LM 40ost, 50-60d, LAD 99p ruptured plaque (3.0x28 DES), LCX 50p/m, RCA 30-40p, 3m69m 65-70%.  . Coronary artery disease involving native coronary artery of native heart without angina pectoris    Severe left main disease at catheterization July 2015  CABG x3 with a LIMA to the LAD, SVG to the OM, SVG to the PDA on 03/31/14. EF 60% by cath.   . Diastolic heart failure (HCC)[place]. GERD without esophagitis 08/04/2010  . Hepatic cirrhosis (HCC)[place] a. Dx 01/2014 -  CT a/p   . History of blood transfusion    "related to bleeding ulcers"  . History of concussion    1976--  NO RESIDUAL  . History of GI bleed    a. UGIB 07/2012;  b. 01/2014 admission with GIB/FOB stool req 1U prbc's->EGD showed portal gastropathy, barrett's esoph, and chronic active h. pylori gastritis.  Marland Kitchen History of gout    2007 &  2008  LEFT LEG-- NO ISSUE SINCE  . Hyperlipidemia   . Iron deficiency anemia   . Kidney stones   .  OA (osteoarthritis of spine)    LOWER BACK--  INTERMITTANT LEFT LEG NUMBNESS  . OSA (obstructive sleep apnea)    PULMOLOGIST-  DR CLANCE--  MODERATE OSA  STARTED CPAP 2012--  BUT CURRENTLY HAS NOT USED PAST 6 MONTHS  . Phimosis    a. s/p circumcision 2015.  . Type 2 diabetes mellitus ([place])   . Unspecified essential hypertension     Past Surgical History:  Procedure Laterality Date  . ANKLE FRACTURE SURGERY Right 1989   "plate put in"  . APPENDECTOMY  05-16-2004   open  . CIRCUMCISION N/A 09/09/2013   Procedure: CIRCUMCISION ADULT;  Surgeon: John Amass, MD;  [facility];  Service: Urology;  Laterality: N/A;  . COLECTOMY  05-16-2004  . CORONARY ANGIOPLASTY WITH STENT PLACEMENT  06/28/2012  DR COOPER   PCI W/  X1 DES to [place]. LAD/  LM  40% OSTIAL & 50-60% DISTAL /  50% PROX LCX/  30-40% PROX RCA & 50% MID RCA/   LVEF 65-70%  . CORONARY ARTERY BYPASS GRAFT N/A 03/31/2014   Procedure: CORONARY ARTERY BYPASS GRAFTING (CABG) times 3 using left internal mammary artery and right saphenous vein.;  Surgeon: Melrose Nakayama, MD;  Location: [place];  Service: Open Heart Surgery;  Laterality: N/A;  . DOBUTAMINE STRESS ECHO  06-08-2012   MODERATE HYPOKINESIS/ ISCHEMIA MID INFERIOR WALL  . ESOPHAGOGASTRODUODENOSCOPY  08/15/2012   Procedure: ESOPHAGOGASTRODUODENOSCOPY (EGD);  Surgeon: Wonda Horner, MD;  Location: [facility] ENDOSCOPY;  Service: Endoscopy;  Laterality: N/A;  . ESOPHAGOGASTRODUODENOSCOPY N/A 02/17/2014   Procedure: ESOPHAGOGASTRODUODENOSCOPY (EGD);  Surgeon: Jeryl Columbia, MD;  Location[facility]COPY;  Service: Endoscopy;  Laterality: N/A;  . ESOPHAGOGASTRODUODENOSCOPY N/A 04/17/2020   Procedure: ESOPHAGOGASTRODUODENOSCOPY (EGD);  Surgeon: Ronnette Juniper, MD;  Location: [place];  Service: Gastroenterology;  Laterality: N/A;  . ESOPHAGOGASTRODUODENOSCOPY (EGD) WITH PROPOFOL N/A 09/30/2017   Procedure: ESOPHAGOGASTRODUODENOSCOPY (EGD) WITH PROPOFOL;  Surgeon: Wonda Horner, MD;  Location: [facility] ENDOSCOPY;  Service: Endoscopy;  Laterality: N/A;  . ESOPHAGOGASTRODUODENOSCOPY (EGD) WITH PROPOFOL N/A 12/20/2017   Procedure: ESOPHAGOGASTRODUODENOSCOPY (EGD) WITH PROPOFOL;  Surgeon: Clarene Essex, MD;  Location: [place];  Service: Endoscopy;  Laterality: N/A;  . ESOPHAGOGASTRODUODENOSCOPY (EGD) WITH PROPOFOL N/A 08/10/2020   Procedure: ESOPHAGOGASTRODUODENOSCOPY (EGD) WITH PROPOFOL;  Surgeon: Wilford Corner, MD;  Location: [place];  Service: Endoscopy;  Laterality: N/A;  . HERNIA REPAIR    . INTRAOPERATIVE TRANSESOPHAGEAL ECHOCARDIOGRAM N/A 03/31/2014   Procedure: INTRAOPERATIVE TRANSESOPHAGEAL ECHOCARDIOGRAM;  Surgeon: Melrose Nakayama, MD;  Location: [place];  Service: Open Heart Surgery;  Laterality: N/A;  . IR ANGIOGRAM SELECTIVE EACH ADDITIONAL VESSEL  06/16/2020  . IR ANGIOGRAM SELECTIVE EACH ADDITIONAL VESSEL  06/16/2020  . IR ANGIOGRAM SELECTIVE EACH ADDITIONAL VESSEL  06/16/2020  . IR ANGIOGRAM SELECTIVE EACH ADDITIONAL VESSEL  06/16/2020  . IR ANGIOGRAM SELECTIVE EACH ADDITIONAL VESSEL  06/16/2020  . IR ANGIOGRAM SELECTIVE EACH ADDITIONAL VESSEL  06/16/2020  .  IR ANGIOGRAM SELECTIVE EACH ADDITIONAL VESSEL  06/16/2020  . IR ANGIOGRAM SELECTIVE EACH ADDITIONAL VESSEL  06/16/2020  . IR ANGIOGRAM SELECTIVE EACH ADDITIONAL VESSEL  06/16/2020  . IR ANGIOGRAM SELECTIVE EACH ADDITIONAL VESSEL  06/30/2020  . IR ANGIOGRAM SELECTIVE EACH ADDITIONAL VESSEL  06/30/2020  . IR ANGIOGRAM VISCERAL SELECTIVE  06/16/2020  . IR ANGIOGRAM VISCERAL SELECTIVE  06/30/2020  . IR EMBO ARTERIAL NOT HEMORR HEMANG INC GUIDE ROADMAPPING  06/16/2020  . IR EMBO TUMOR ORGAN ISCHEMIA INFARCT INC GUIDE ROADMAPPING  06/30/2020  . IR RADIOLOGIST EVAL & MGMT  05/06/2020  . IR RADIOLOGIST EVAL & MGMT  05/20/2020  . IR US GUIDE VASC ACCESS RIGHT  06/16/2020  . IR US GUIDE VASC ACCESS RIGHT  06/30/2020  . LAPAROSCOPIC UMBILICAL HERNIA REPAIR W/ MESH  06-06-2011  . LEFT HEART  CATHETERIZATION WITH CORONARY ANGIOGRAM N/A 03/28/2014   Procedure: LEFT HEART CATHETERIZATION WITH CORONARY ANGIOGRAM;  Surgeon: Sinclair Grooms, MD;  Location: [facility] CATH LAB;  Service: Cardiovascular;  Laterality: N/A;  . LIPOMA EXCISION Left 08/25/2017   Procedure: EXCISION LIPOMA LEFT POSTERIOR THIGH;  Surgeon: Clovis Riley, MD;  Location: [place];  Service: General;  Laterality: Left;  . NEPHROLITHOTOMY  1990'S  . OPEN APPENDECTOMY W/ PARTIAL CECECTOMY  05-16-2004  . PERCUTANEOUS CORONARY STENT INTERVENTION (PCI-S) N/A 06/28/2012   Procedure: PERCUTANEOUS CORONARY STENT INTERVENTION (PCI-S);  Surgeon: Sherren Mocha, MD;  Location: [facility] CATH LAB;  Service: Cardiovascular;  Laterality: N/A;     Home Medications:  Prior to Admission medications   Medication Sig Start Date End Date Taking? Authorizing Provider  albuterol (VENTOLIN HFA) 108 (90 Base) MCG/ACT inhaler Inhale 2 puffs into the lungs every 6 (six) hours as needed for wheezing or shortness of breath. 03/12/20  Yes John Koch, MD  atorvastatin (LIPITOR) 40 MG tablet TAKE 1 TABLET BY MOUTH DAILY AT 6 PM Patient taking differently: Take by mouth daily. 05/13/20  Yes John Koch, MD  ferrous sulfate 325 (65 FE) MG EC tablet Take 1 tablet (325 mg total) by mouth daily with breakfast. 01/16/20  Yes John Koch, MD  gabapentin (NEURONTIN) 100 MG capsule TAKE 1 CAPSULE(100 MG) BY MOUTH TWICE DAILY Patient taking differently: Take 100 mg by mouth 2 (two) times daily. 08/03/20  Yes John Koch, MD  glimepiride (AMARYL) 2 MG tablet Take 2 mg by mouth daily. 07/13/20  Yes [provider]  metFORMIN (GLUCOPHAGE) 850 MG tablet TAKE 1 TABLET(850 MG) BY MOUTH TWICE DAILY WITH A MEAL Patient taking differently: Take 850 mg by mouth 2 (two) times daily with a meal. 06/16/20  Yes John Koch, MD  nitroGLYCERIN (NITROSTAT) 0.4 MG SL tablet Place 1 tablet (0.4 mg total) under the tongue every 5  (five) minutes as needed for chest pain. 06/08/16  Yes Nahser, Wonda Cheng, MD  Sennosides (EX-LAX PO) Take 1 tablet by mouth daily as needed (constipation).   Yes [provider]  torsemide (DEMADEX) 20 MG tablet Take 40 mg (2 pills) every morning and every afternoon except Mon, Wed, Fri afternoons take only 20 mg (1 pill) 02/06/20  Yes Nahser, Wonda Cheng, MD  umeclidinium-vilanterol (ANORO ELLIPTA) 62.5-25 MCG/INH AEPB Inhale 1 puff into the lungs daily. 03/12/20  Yes John Koch, MD  Accu-Chek Softclix Lancets lancets USE AS DIRECTED TO TEST BLOOD SUGAR FOUR TIMES DAILY 03/13/20   John Koch, MD  blood glucose meter kit and supplies Dispense based on patient and insurance preference. Use up  to four times daily as directed. (FOR ICD-10 E10.9, E11.9). 03/12/20   John Koch, MD  pantoprazole (PROTONIX) 40 MG tablet Take 1 tablet (40 mg total) by mouth daily. Patient not taking: Reported on 08/09/2020 04/23/20   Pokhrel, Corrie Mckusick, MD  polyethylene glycol (MIRALAX / GLYCOLAX) 17 g packet Take 17 g by mouth daily as needed for moderate constipation. Patient not taking: Reported on 08/09/2020 04/23/20   Flora Lipps, MD  spironolactone (ALDACTONE) 25 MG tablet Take 1 tablet (25 mg total) by mouth daily. Patient not taking: Reported on 08/09/2020 01/04/20   Nita Sells, MD    Inpatient Medications: Scheduled Meds: . sodium chloride   Intravenous Once  . atorvastatin  40 mg Oral Daily  . furosemide  40 mg Intravenous BID  . gabapentin  100 mg Oral BID  . insulin aspart  0-20 Units Subcutaneous TID WC  . insulin glargine  10 Units Subcutaneous QHS  . ipratropium-albuterol  3 mL Nebulization BID  . pantoprazole (PROTONIX) IV  40 mg Intravenous Q24H   Continuous Infusions: . sodium chloride Stopped (08/10/20 0249)  . octreotide  (SANDOSTATIN)    IV infusion 50 mcg/hr (08/13/20 0444)   PRN Meds: acetaminophen **OR** acetaminophen, albuterol, morphine  injection, nitroGLYCERIN, senna, sodium chloride  Allergies:   No Known Allergies  Social History:   Social History   Socioeconomic History  . Marital status: Married    Spouse name: Not on file  . Number of children: Not on file  . Years of education: Not on file  . Highest education level: Not on file  Occupational History  . Not on file  Tobacco Use  . Smoking status: Former Smoker    Packs/day: 2.00    Years: 40.00    Pack years: 80.00    Types: Cigarettes    Quit date: 08/29/1996    Years since quitting: 23.9  . Smokeless tobacco: Never Used  Vaping Use  . Vaping Use: Never used  Substance and Sexual Activity  . Alcohol use: Yes    Alcohol/week: 8.0 standard drinks    Types: 8 Cans of beer per week    Comment: 2 beers every other day  . Drug use: No  . Sexual activity: Yes  Other Topics Concern  . Not on file  Social History Narrative   Lives in  with wife, son, and son's family.   Social Determinants of Health   Financial Resource Strain: Not on file  Food Insecurity: Not on file  Transportation Needs: Not on file  Physical Activity: Not on file  Stress: Not on file  Social Connections: Not on file  Intimate Partner Violence: Not on file    Family History:    Family History  Problem Relation Age of Onset  . Lung cancer Sister   . Cancer Sister        lung  . Cancer Mother   . Cancer Father        died in his 26s.  . Cancer Brother        lung  . Coronary artery disease Other   . Diabetes Other   . Colon cancer Other   . Cancer Sister        lung     ROS:  Please see the history of present illness.   All other ROS reviewed and negative.     Physical Exam/Data:   Vitals:   08/12/20 2311 08/13/20 0327 08/13/20 0735 08/13/20 0815  BP: 140/71 (!) 161/80  Marland Kitchen)  135/58  Pulse: 69 71  66  Resp: '14 19  18  ' Temp: 98.2 F (36.8 C) 98.4 F (36.9 C) 98.1 F (36.7 C) 98.3 F (36.8 C)  TempSrc: Oral Oral  Oral  SpO2: 96% 93%  94%  Weight:       Height:        Intake/Output Summary (Last 24 hours) at 08/13/2020 1025 Last data filed at 08/13/2020 0906 Gross per 24 hour  Intake 401.93 ml  Output 1050 ml  Net -648.07 ml   Last 3 Weights 08/11/2020 08/09/2020 05/11/2020  Weight (lbs) 252 lb 3.3 oz 235 lb 0.2 oz 235 lb  Weight (kg) 114.4 kg 106.6 kg 106.595 kg     Body mass index is 38.35 kg/m.  General:  Well nourished, well developed, in no acute distress HEENT: sclera anicteric Lymph: no adenopathy Neck: no JVD Endocrine:  No thryomegaly Vascular: No carotid bruits; difficult to palpate pedal pulses due to edema Cardiac:  normal S1, S2; RRR; no murmurs, rubs, or gallops Lungs:  clear to auscultation bilaterally, crackles at lung bases without wheezing or rhonchi  Abd: soft, obese, nontender Ext: 3+ LE edema with chronic venous stasis skin changes Musculoskeletal:  No deformities, BUE and BLE strength normal and equal Skin: warm and dry  Neuro:  CNs 2-12 intact, no focal abnormalities noted Psych:  Normal affect   EKG:  The EKG was personally reviewed and demonstrates:  Sinus rhythm with rate 75 bpm, incomplete RBBB, no STE/D, QTc 480. No significant change from previous.  Telemetry:  Telemetry was personally reviewed and demonstrates:  Sinus rhythm with occasional PVCs/PACs.  Relevant CV Studies: Echocardiogram 04/2020: 1. Left ventricular ejection fraction, by estimation, is 60 to 65%. The  left ventricle has normal function. The left ventricle has no regional  wall motion abnormalities. Left ventricular diastolic parameters are  consistent with Grade III diastolic  dysfunction (restrictive).  2. Right ventricular systolic function is normal. The right ventricular  size is normal. There is mildly elevated pulmonary artery systolic  pressure. The estimated right ventricular systolic pressure is 67.6 mmHg.  3. Left atrial size was mildly dilated.  4. Right atrial size was mild to moderately dilated.  5. The  mitral valve is normal in structure. No evidence of mitral valve  regurgitation. The mean mitral valve gradient is 3.0 mmHg with average  heart rate of 69 bpm. Moderate mitral annular calcification.  6. The aortic valve is tricuspid. There is moderate calcification of the  aortic valve. Aortic valve regurgitation is trivial. Mild aortic valve  stenosis. Aortic valve mean gradient measures 10.0 mmHg.  7. Aortic dilatation noted. There is mild dilatation of the ascending  aorta, measuring 42 mm.  8. The inferior vena cava is dilated in size with >50% respiratory  variability, suggesting right atrial pressure of 8 mmHg.  Left heart catheterization 2015 (pre-CABG): ANGIOGRAPHIC DATA: The left main coronary artery is heavily calcified. There is 30% proximal stenosis. An 70-80% distal left main stenosis.  The left anterior descending artery is patent, contains a proximal stent, and an eccentric ostial 60-70% narrowing. The distal vessel is large.  The left circumflex artery is calcified with ostial 90%, hazy stenosis. The mid vessel contains segmental 80-90% narrowing before ending, trifurcating obtuse marginal.  The right coronary artery is heavy proximal to mid calcification. There is proximal and mid 50% narrowing. Large PDA and left ventricular branch arises distally.Marland Kitchen  LEFT VENTRICULOGRAM: Left ventricular angiogram was done in the 30 RAO projection and  revealed a cavity that is upper limit of normal in size. Contractility is normal with an EF of 60%.   Laboratory Data:  High Sensitivity Troponin:   Recent Labs  Lab 08/09/20 1733 08/09/20 1930  TROPONINIHS 13 13     Chemistry Recent Labs  Lab 08/11/20 0306 08/12/20 0150 08/13/20 0047  NA 139 138 137  K 3.3* 3.8 4.0  CL 103 102 102  CO2 '26 26 26  ' GLUCOSE 113* 107* 134*  BUN '21 21 20  ' CREATININE 1.25* 1.30* 1.36*  CALCIUM 8.3* 8.2* 8.2*  GFRNONAA >60 58* 55*  ANIONGAP '10 10 9    ' Recent Labs  Lab 08/11/20 0306  08/12/20 0150 08/13/20 0047  PROT 6.3* 6.2* 6.5  ALBUMIN 3.2* 3.3* 3.3*  AST '23 22 20  ' ALT '16 16 15  ' ALKPHOS 106 100 98  BILITOT 1.5* 1.7* 1.6*   Hematology Recent Labs  Lab 08/11/20 0306 08/11/20 1521 08/12/20 0150 08/13/20 0047  WBC 4.3  --  4.9 4.5  RBC 3.05*  --  3.17* 3.53*  HGB 7.0* 8.2* 7.6* 8.2*  HCT 25.0* 29.2* 25.5* 28.9*  MCV 82.0  --  80.4 81.9  MCH 23.0*  --  24.0* 23.2*  MCHC 28.0*  --  29.8* 28.4*  RDW 19.0*  --  19.3* 19.3*  PLT 126*  --  124* 100*   BNP Recent Labs  Lab 08/09/20 1926  BNP 326.3*    DDimer No results for input(s): DDIMER in the last 168 hours.   Radiology/Studies:  DG Chest 2 View  Result Date: 08/09/2020 CLINICAL DATA:  Shortness of breath with exertion. EXAM: CHEST - 2 VIEW COMPARISON:  04/15/2020 FINDINGS: Median sternotomy and prior CABG. Stable cardiomegaly. Unchanged mediastinal contours with aortic atherosclerosis. Pulmonary edema with slight increase from prior exam. Minimal blunting of the costophrenic angles consistent with small effusions. There is fluid in the fissures. No confluent consolidation. No pneumothorax. No acute osseous abnormalities are seen. IMPRESSION: Increased pulmonary edema from prior exam with small pleural effusions. Stable cardiomegaly. Aortic Atherosclerosis (ICD10-I70.0). Electronically Signed   By: Keith Rake M.D.   On: 08/09/2020 17:24   US Abdomen Limited RUQ (LIVER/GB)  Result Date: 08/10/2020 CLINICAL DATA:  Cirrhosis, ascites search EXAM: LIMITED ABDOMEN ULTRASOUND FOR ASCITES TECHNIQUE: Limited ultrasound survey for ascites was performed in all four abdominal quadrants. COMPARISON:  MR abdomen, 05/13/2020 FINDINGS: No evidence of ascites.  Small right pleural effusion. Cirrhotic morphology of the liver. There is a hyperechoic subcapsular mass of the right lobe of the liver measuring approximately 3.3 x 2.9 x 2.5 cm, as seen and better characterized by prior MR. IMPRESSION: 1. No evidence of  ascites. 2. Small right pleural effusion. 3. Cirrhotic morphology of the liver. Hyperechoic subcapsular mass of the right lobe of the liver, better characterized as hepatocellular carcinoma by prior MR. Electronically Signed   By: Eddie Candle M.D.   On: 08/10/2020 12:32     Assessment and Plan:   1. Acute on chronic diastolic CHF: patient presented with volume overload complaints in addition to black stools. He had pulmonary edema on CXR. BNP 326. He was felt to have anasarca on exam. He has been diuresing with IV lasix 92m BID with additional doses with blood transfusions. Weight is up from 235lbs on admission to 252lbs as of yesterday if accurate. I&Os are -5033min the past 24 hours and net -83963mhis admission. Still with 3+ LE edema on exam and faint crackles at lung bases - Will  increase lasix to 78m IV BID  - Continue to monitor strict I&Os and daily weights - Continue to monitor electrolytes and replete as needed to maintain K>4, Mg>2  2. CAD s/p CABG: No complaints of chest pain this admission despite acute on chronic anemia with Hgb 5.5. EKG is non-ischemic. Not on aspirin due to chronic anemia and history of multiple GI bleeds. Not on BBlocker due to baseline bradycardia -  Continue statin  3. HTN: Had some soft blood pressures earlier in the admission. Home losartan on hold. Favor mild permissive hypertension while diuresing - Anticipate restarting losartan prior to discharge  - Continue lasix as above  4. HLD: LDL 93 10/2019; goal <70. LFT's appear stable, though with underlying hepatocellular carcinoma and cirrhosis.  - Continue atorvastatin  5. DM type 2: A1C 5.3; goal <7 - Continue management per primary team  6. OSA: not compliant with CPAP - Would benefit from regular CPAP use  7. CKD stage 2-3: Cr 1.3 - Continue to monitor closely with diuresis  Remainder of care per primary team: - Acute on chronic anemia 2/2 acute blood loss anemia - Decompensated alcoholic  liver cirrhosis - Thrombocytopenia - Hepatocellular carcinoma   New York Heart Association (NYHA) Functional Class NYHA Class II-III      For questions or updates, please contact [facility] Please consult [email or web address] for contact info under    Signed, KAbigail Butts PA-C  08/13/2020 10:25 AM    Patient seen and examined.  Agree with above documentation.  Mr. SAntonelliis a 74year old male with a history of CAD status post CABG in [address]81 chronic diastolic heart failure, [id number] cirrhosis, OSA, hepatocellular carcinoma, CKD stage III, hypertension who we are consulted at the request of Dr. SAlfredia Fergusonfor evaluation of acute on chronic diastolic heart failure.  [facility] with black stools.  Found to have hemoglobin down to 5.5 (baseline around 8).  GI was consulted and he underwent EGD on 12/13, which showed gastric/esophageal varices without bleeding.  He was started on octreotide and has received several units of PRBCs.  He was noted to be volume overloaded on admission and has been on IV Lasix 40 mg twice daily.  He follows with Dr. NAcie [name] most recent echocardiogram 04/2020 showed EF 60 to 678% grade 3 diastolic dysfunction, normal RV size/function, no significant valvular disease.  EKG shows sinus rhythm, rate 75, incomplete right bundle branch block, first-degree AV block.  Telemetry personally reviewed and shows normal sinus rhythm with rate 60s to 70s.  Patient seen and examined.  On exam, patient is alert and oriented, regular rate and rhythm, no murmurs, bibasilar crackles, + JVD, 2+ bilateral lower extremity edema.  For his acute on chronic diastolic heart failure, he appears volume overloaded, recommend more aggressive diuresis.  Will increase Lasix to 80 mg twice daily.

## 2020-08-13 NOTE — Progress Notes (Signed)
PROGRESS NOTE    [name]  JTT:[id number] DOB: [birth year] DOA: 08/09/2020 PCP: [name], MD   Brief Narrative:  HPI per Dr. Adella Hare on 08/09/20 [name] is a 74 y.o. male with medical history significant of CAD s/p CABG, COPD,  HFpEF, Cirrhosis of the liver with known varices, h/o GI bleeding, hepatocellular carcinoma developed acute SOB/DOE at home. He also had increased peripheral edema. He reports having black stools. Due to his acute symptoms he presents to [facility] for evaluation.   ED Course: T 97.9  131/47  HR 74  RR 16. ED exam revealed patient to have SOB, wheezing and rales on lung exam,  anasarca to lover abdomen. Lab revelaed Cr 1.23, BNP 326.3, Trop #1 13, #2 13, Hgb 5.5 down from baseline of approximately 8. CXR reveals increased pulmonary edema. In the ED he was given IV lasix 40 mg, he was to be crossmatched and transfused 2 uPRBCs, he was to be started on IV PPI with bolus and infusion. GI consulted who agreed to see patient 08/10/20. [facility] called to admit patient for continued management of GI bleed and acute on Chronic HFpEF.  **Interim History Gastroenterology evaluated and the patient to an EGD and that showed severe portal hypertensive gastropathy in the stomach with diffuse friable mucosa with contact bleeding.  There is also noted to have grade 1 esophageal varices and type I isolated gastric varices with no bleeding or stigmata of recent bleeding.  Patient remained on octreotide drip and was also on a Protonix drip and his octreotide drip stopped on 08/13/2020 and his Protonix drip was changed to IV Protonix 40 mg daily by gastroenterology.  Patient remains significantly volume overloaded so cardiology was consulted for further assistance with diuresis and because it was felt the patient had acute on chronic diastolic CHF exacerbation.  Cardiology has now doubled the patient's diuresis and increase this to 80 twice daily  Assessment & Plan:    Active Problems:   Type 2 diabetes with complication (HCC)   OSA (obstructive sleep apnea)   Hyperlipidemia with target LDL less than 70   GI bleed   CKD (chronic kidney disease), stage III (HCC)   (HFpEF) heart failure with preserved ejection fraction (HCC)   Hepatocellular carcinoma (HCC)  Acute GI Bleed in the setting of Portal Gastropathy History of Gastric Varices -Patient was FOBT positive, with melanotic stools -Hemoglobin dropped to 5.5 on presentation, baseline around 8; Now Hgb/Hct is 7.6/25.5 yesterday and is improved to 8.2/28.9 this morning -Last EGD in August/21 showed gastric varices, nonbleeding -GI consulted, EGD on 08/10/2020 showed severe portal hypertensive gastropathy in the stomach with diffuse friable mucosa with contact bleeding, Grade 1 esophageal varices and type I isolated gastric varices was noted with no bleeding or stigmata of recent bleeding -Patient has not had any recurrent bleeding and hemoglobin has now dropped to 7.6 as above -Gastroenterology was recommending completing 72 hours of octreotide (to end 08/13/20) and also discontinue  patient's Protonix drip and change to IV Protonix daily and recommending continue PPI daily as an outpatient -Diet has been advanced to a low sodium diet and recommending the patient be discharged in the next 1 to 2 days if stable -Patient to be followed by Dr. Michail Sermon in the outpatient setting in 4 weeks -Patient is stable from a GI perspective but given his heart failure we will have cardiology evaluate prior to safe discharge disposition -Continue Monitor closely for signs and symptoms of bleeding  Acute  on chronic iron deficiency blood loss anemia -Likely 2/2 above -Hemoglobin dropped to 5.5 on presentation, baseline around 8; Now Hgb/Hct is 8.2/20.9 -S/p 3 units of PRBC on 08/10/2020, another 1 unit of PRBC on 08/11/2020 -Continue to monitor for signs and symptoms of bleeding; currently no overt bleeding noted Repeat  CBC in a.m.  Decompensated alcoholic liver cirrhosis Hyperbilirubinemia -Noted to have an anasarca, oriented -LFTs WNL, T bili 1.4 -> 1.7 and is 1.6 today, will continue to Trend  -RUQ Ultrasound, unchanged, no ascites -Continue IV Lasix 40 mg every 12 and cardiology is increased this to IV 80 twice daily -Hold home Torsemide, Aldactone for now and will resume once he is more diuresed; cardiology consulted for his volume overload  Thrombocytopenia -Patient's Platelet Count went from 126 -> 124 and dropped to 100 today -Continue to Monitor for S/Sx of Bleeding -Repeat CBC in AM   Acute on Chronic Grade 3 Diastolic HF -Noted Anasarca -BNP 326 -Chest x-ray showed increased pulmonary edema; repeat chest x-ray in a.m. -Troponin WNL, EKG with no acute ST changes -Last echo done on 04/2020 showed EF of 60 to 65% with grade 3 diastolic dysfunction -Continue IV Lasix 40 mg q12h, hold home losartan for now -Cardiology consulted on 08/11/2020 but consult not put in so will do it now to have them assist with diuresis -Strict I's and O's, daily weights; Patient is Now - 1.939 liters; weight is up 20 pounds from 08/10/2019 -We will advance his diet to low-sodium diet with 2 g sodium restriction and restriction to 1500 mL as well -Cardiology consulted for further assistance and recommendations and Dr. Nechama Guard has increased his IV Lasix to 80 mg twice daily and that he continues to have lung crackles as well as 3+ lower extremity edema -Repeat chest x-ray in a.m.  Hypokalemia -Patient's K+ was 3.3 and repeat is now 4.0 -Continue to Monitor and Replete as Necessary -Repeat CMP in the AM   History of Asthma -Currently not in exacerbation X-continue with DuoNeb 3 mils twice daily, as well as albuterol neb 2.5 mg every 2 hours as needed for shortness of breath -Supplemental oxygen via nasal cannula -SpO2: 92 % O2 Flow Rate (L/min): 1 L/min -Patient will need an ambulatory home O2 screen prior to  discharge  CAD -Involving the native coronary artery of the heart without angina pectoris-he has severe left main disease on his cath in 2015 and underwent CABG x3 -Continue with nitroglycerin 0.4 mg sublingual every 5 minutes as needed chest pain, atorvastatin 40 once p.o. daily -Currently not on a beta-blocker due to his baseline bradycardia and not on aspirin due to chronic anemia and history of multiple GI bleeds -We will consult cardiology for further assistance but is losartan has been held for now -Dr. Gardiner Rhyme recommends continuing the patient's statin as above  GERD -C/w PPI as above as IV  CKD Stage II -Creatinine stable at baseline -Continue to Monitor as Patient is getting diuresis -BUN/Cr now gone 27/1.25 -> 31/1.20 -> 21/1.25 -> 21/1.30 and patient's BUNs/creatinine today is 20/1.36 -Avoid Nephrotoxic Medications, Contrast Dyes, Avoid Hypotension and Renally adjust Medications -Continue to Monitor and Trend -Repeat CMP  Diabetes Mellitus Type 2 -Last A1c 5.3% on 08/10/20 -Bolus to keep his hemoglobin A1c less than 7 -Continue SSI, Accu-Cheks, Lantus, hypoglycemic protocol -Hold Home Regimen -CBGs have been ranging from 73-210  Hypertension -BP was soft the day before yesterday yesterday  -His Losartan had been held; [place] can resume in the AM and cardiology  anticipating resuming prior to discharge -Continue to Monitor BP per Protocol -Last BP was 132/57  Hepatocellular Carcinoma -Patient currently follows with IR; patient had chemoembolization in November -Continue outpatient follow-up  HLD -C/w Atorvastatin 40 mg po Daily   OSA -Noncompliant with CPAP  Obesity -Complicates overall care and prognosis -Estimated body mass index is 38.8 kg/m as calculated from the following:   Height as of this encounter: 5\' 8"  (1.727 m).   Weight as of this encounter: 115.8 kg. -Weight Loss and Dietary Counseling given  Lifestyle modification  advised  [place] -Patient with multiple comorbidities, overall poor prognosis -Palliative consulted-initially agreed to be DNR, but changed his mind and wants to be full code  DVT prophylaxis: SCDs Code Status: FULL CODE  Family Communication: No family present at bedside  Disposition Plan: Pending further clinical improvement and diuresis back to dry weight as gastroenterology has signed off the patient now.  Status is: Inpatient  Remains inpatient appropriate because:Unsafe d/c plan, IV treatments appropriate due to intensity of illness or inability to take PO and Inpatient level of care appropriate due to severity of illness   Dispo: The patient is from: Home              Anticipated d/c is to: Home              Anticipated d/c date is: 2 days              Patient currently is not medically stable to d/c.  Consultants:   Gastroenterology  Palliative Care Medicine  Cardiology    Procedures: EGD Findings:      Grade I varices were found in the lower third of the esophagus. No       stigmata of bleeding.      The Z-line was regular and was found 45 cm from the incisors.      Severe portal hypertensive gastropathy was found in the cardia, in the       gastric fundus and in the gastric body.      Diffuse friable mucosa with contact bleeding was found in the cardia, in       the gastric fundus and in the gastric body.      Type 1 isolated gastric varices (IGV1, varices located in the fundus)       with no bleeding were found in the gastric fundus. There were no       stigmata of recent bleeding. They were medium in largest diameter.      The examined duodenum was normal. Impression:               - Grade I esophageal varices.                           - Z-line regular, 45 cm from the incisors.                           - Portal hypertensive gastropathy.                           - Friable gastric mucosa.                           - Type 1 isolated gastric varices (IGV1,  varices  located in the fundus), without bleeding.                           - Normal examined duodenum.                           - No specimens collected. Recommendation:           - Clear liquid diet.                           - Observe patient's clinical course.                           - Continue Octreotide and Protonix drips.  Antimicrobials:  Anti-infectives (From admission, onward)   None        Subjective: Seen and examined at bedside and patient was feeling better but still has significant swelling in his legs.  No nausea or vomiting.  Denies any abdominal pain at this time and has not had any more bloody bowel movements.  No other concerns or complaints at this time and feels fairly well but has not really ambulated.  Objective: Vitals:   08/13/20 0815 08/13/20 1127 08/13/20 1200 08/13/20 1300  BP: (!) 135/58  138/88   Pulse: 66  62   Resp: 18  14   Temp: 98.3 F (36.8 C) 98.5 F (36.9 C)    TempSrc: Oral     SpO2: 94%  92%   Weight:    115.8 kg  Height:        Intake/Output Summary (Last 24 hours) at 08/13/2020 1604 Last data filed at 08/13/2020 1505 Gross per 24 hour  Intake 120 ml  Output 1100 ml  Net -980 ml   Filed Weights   08/09/20 1944 08/11/20 0509 08/13/20 1300  Weight: 106.6 kg 114.4 kg 115.8 kg   Examination: Physical Exam:  Constitutional: WN/WD obese Caucasian chronically ill-appearing male in no acute distress appears more comfortable than he did yesterday. Eyes: Lids and conjunctivae normal, sclerae anicteric  ENMT: External Ears, Nose appear normal. Grossly normal hearing. Mucous membranes are moist.  Neck: Appears normal, supple, no cervical masses, normal ROM, no appreciable thyromegaly; slight JVD Respiratory: Diminished to auscultation bilaterally with coarse breath sounds and some slight crackles noted.  No appreciable wheezing or rales.  No rhonchi and patient has a Normal respiratory effort and patient is  not tachypenic. No accessory muscle use.  Unlabored breathing and he is not wearing any supplemental oxygen via nasal cannula Cardiovascular: RRR, no murmurs / rubs / gallops. S1 and S2 auscultated.  Patient has 2+ lower extremity pitting edema bilaterally Abdomen: Soft, mildly tender, distended secondary to body habitus has some ascites. Bowel sounds positive.  GU: Deferred. Musculoskeletal: No clubbing / cyanosis of digits/nails. No joint deformity upper and lower extremities.  Skin: No rashes, lesions, ulcers on limited skin evaluation. No induration; Warm and dry.  Neurologic: CN 2-12 grossly intact with no focal deficits. Romberg sign and cerebellar reflexes not assessed.  Psychiatric: Normal judgment and insight. Alert and oriented x 3. Normal mood and appropriate affect.   Data Reviewed: I have personally reviewed following labs and imaging studies  CBC: Recent Labs  Lab 08/09/20 1733 08/10/20 0605 08/10/20 2140 08/11/20 0306 08/11/20 1521 08/12/20 0150 08/13/20 0047  WBC 3.5*  --   --  4.3  --  4.9 4.5  NEUTROABS  --   --   --  3.4  --  4.0 3.7  HGB 5.5*   < > 7.6* 7.0* 8.2* 7.6* 8.2*  HCT 20.0*   < > 26.9* 25.0* 29.2* 25.5* 28.9*  MCV 79.7*  --   --  82.0  --  80.4 81.9  PLT 138*  --   --  126*  --  124* 100*   < > = values in this interval not displayed.   Basic Metabolic Panel: Recent Labs  Lab 08/09/20 1733 08/10/20 0548 08/10/20 1322 08/11/20 0306 08/12/20 0150 08/13/20 0047  NA 136 139 141 139 138 137  K 3.6 3.3* 3.6 3.3* 3.8 4.0  CL 102 102 101 103 102 102  CO2 23 26  --  26 26 26   GLUCOSE 94 79 87 113* 107* 134*  BUN 30* 27* 31* 21 21 20   CREATININE 1.23 1.25* 1.20 1.25* 1.30* 1.36*  CALCIUM 8.5* 8.6*  --  8.3* 8.2* 8.2*  MG  --   --   --   --   --  2.0  PHOS  --   --   --   --   --  3.3   GFR: Estimated Creatinine Clearance: 58.9 mL/min (A) (by C-G formula based on SCr of 1.36 mg/dL (H)). Liver Function Tests: Recent Labs  Lab 08/10/20 0548  08/11/20 0306 08/12/20 0150 08/13/20 0047  AST 22 23 22 20   ALT 15 16 16 15   ALKPHOS 106 106 100 98  BILITOT 1.4* 1.5* 1.7* 1.6*  PROT 6.5 6.3* 6.2* 6.5  ALBUMIN 3.3* 3.2* 3.3* 3.3*   No results for input(s): LIPASE, AMYLASE in the last 168 hours. No results for input(s): AMMONIA in the last 168 hours. Coagulation Profile: Recent Labs  Lab 08/10/20 1525  INR 1.2   Cardiac Enzymes: No results for input(s): CKTOTAL, CKMB, CKMBINDEX, TROPONINI in the last 168 hours. BNP (last 3 results) No results for input(s): PROBNP in the last 8760 hours. HbA1C: No results for input(s): HGBA1C in the last 72 hours. CBG: Recent Labs  Lab 08/12/20 1148 08/12/20 1602 08/12/20 2152 08/13/20 0733 08/13/20 1129  GLUCAP 210* 122* 115* 73 106*   Lipid Profile: No results for input(s): CHOL, HDL, LDLCALC, TRIG, CHOLHDL, LDLDIRECT in the last 72 hours. Thyroid Function Tests: No results for input(s): TSH, T4TOTAL, FREET4, T3FREE, THYROIDAB in the last 72 hours. Anemia Panel: No results for input(s): VITAMINB12, FOLATE, FERRITIN, TIBC, IRON, RETICCTPCT in the last 72 hours. Sepsis Labs: No results for input(s): PROCALCITON, LATICACIDVEN in the last 168 hours.  Recent Results (from the past 240 hour(s))  Resp Panel by RT-PCR (Flu A&B, Covid) Nasopharyngeal Swab     Status: None   Collection Time: 08/09/20  7:47 PM   Specimen: Nasopharyngeal Swab; Nasopharyngeal(NP) swabs in vial transport medium  Result Value Ref Range Status   SARS Coronavirus 2 by RT PCR NEGATIVE NEGATIVE Final    Comment: (NOTE) SARS-CoV-2 target nucleic acids are NOT DETECTED.  The SARS-CoV-2 RNA is generally detectable in upper respiratory specimens during the acute phase of infection. The lowest concentration of SARS-CoV-2 viral copies this assay can detect is 138 copies/mL. A negative result does not preclude SARS-Cov-2 infection and should not be used as the sole basis for treatment or other patient management  decisions. A negative result may occur with  improper specimen collection/handling, submission of specimen other than nasopharyngeal swab, presence of viral mutation(s) within the areas targeted by this assay,  and inadequate number of viral copies(<138 copies/mL). A negative result must be combined with clinical observations, patient history, and epidemiological information. The expected result is Negative.  Fact Sheet for Patients:  EntrepreneurPulse.com.au  Fact Sheet for Healthcare Providers:  IncredibleEmployment.be  This test is no t yet approved or cleared by the [place] FDA and  has been authorized for detection and/or diagnosis of SARS-CoV-2 by FDA under an Emergency Use Authorization (EUA). This EUA will remain  in effect (meaning this test can be used) for the duration of the COVID-19 declaration under Section 564(b)(1) of the Act, 21 U.S.C.section 360bbb-3(b)(1), unless the authorization is terminated  or revoked sooner.       Influenza A by PCR NEGATIVE NEGATIVE Final   Influenza B by PCR NEGATIVE NEGATIVE Final    Comment: (NOTE) The Xpert Xpress SARS-CoV-2/FLU/RSV plus assay is intended as an aid in the diagnosis of influenza from Nasopharyngeal swab specimens and should not be used as a sole basis for treatment. Nasal washings and aspirates are unacceptable for Xpert Xpress SARS-CoV-2/FLU/RSV testing.  Fact Sheet for Patients: EntrepreneurPulse.com.au  Fact Sheet for Healthcare Providers: IncredibleEmployment.be  This test is not yet approved or cleared by the [place] FDA and has been authorized for detection and/or diagnosis of SARS-CoV-2 by FDA under an Emergency Use Authorization (EUA). This EUA will remain in effect (meaning this test can be used) for the duration of the COVID-19 declaration under Section 564(b)(1) of the Act, 21 U.S.C. section 360bbb-3(b)(1), unless the  authorization is terminated or revoked.  Performed at [facility], [place] [address] [place], [place] [address]      RN Pressure Injury Documentation:     Estimated body mass index is 38.8 kg/m as calculated from the following:   Height as of this encounter: 5\' 8"  (1.727 m).   Weight as of this encounter: 115.8 kg.  Malnutrition Type:   Malnutrition Characteristics:   Nutrition Interventions:     Radiology Studies: No results found. Scheduled Meds: . sodium chloride   Intravenous Once  . atorvastatin  40 mg Oral Daily  . furosemide  80 mg Intravenous BID  . gabapentin  100 mg Oral BID  . insulin aspart  0-20 Units Subcutaneous TID WC  . insulin glargine  10 Units Subcutaneous QHS  . ipratropium-albuterol  3 mL Nebulization BID  . pantoprazole (PROTONIX) IV  40 mg Intravenous Q24H   Continuous Infusions: . sodium chloride Stopped (08/10/20 0249)  . octreotide  (SANDOSTATIN)    IV infusion 50 mcg/hr (08/13/20 0444)     LOS: 4 days   Kerney Elbe, DO [facility] PAGER is on AMION  If 7PM-7AM, please contact night-coverage [email or web address]

## 2020-08-14 ENCOUNTER — Ambulatory Visit: Payer: Medicare HMO | Admitting: Internal Medicine

## 2020-08-14 ENCOUNTER — Inpatient Hospital Stay (HOSPITAL_COMMUNITY): Payer: Medicare HMO

## 2020-08-14 DIAGNOSIS — I10 Essential (primary) hypertension: Secondary | ICD-10-CM

## 2020-08-14 LAB — PHOSPHORUS: Phosphorus: 3.2 mg/dL (ref 2.5–4.6)

## 2020-08-14 LAB — CBC WITH DIFFERENTIAL/PLATELET
Abs Immature Granulocytes: 0.01 10*3/uL (ref 0.00–0.07)
Basophils Absolute: 0 10*3/uL (ref 0.0–0.1)
Basophils Relative: 1 %
Eosinophils Absolute: 0.1 10*3/uL (ref 0.0–0.5)
Eosinophils Relative: 3 %
HCT: 26.4 % — ABNORMAL LOW (ref 39.0–52.0)
Hemoglobin: 7.5 g/dL — ABNORMAL LOW (ref 13.0–17.0)
Immature Granulocytes: 0 %
Lymphocytes Relative: 11 %
Lymphs Abs: 0.4 10*3/uL — ABNORMAL LOW (ref 0.7–4.0)
MCH: 23 pg — ABNORMAL LOW (ref 26.0–34.0)
MCHC: 28.4 g/dL — ABNORMAL LOW (ref 30.0–36.0)
MCV: 81 fL (ref 80.0–100.0)
Monocytes Absolute: 0.4 10*3/uL (ref 0.1–1.0)
Monocytes Relative: 9 %
Neutro Abs: 2.9 10*3/uL (ref 1.7–7.7)
Neutrophils Relative %: 76 %
Platelets: 111 10*3/uL — ABNORMAL LOW (ref 150–400)
RBC: 3.26 MIL/uL — ABNORMAL LOW (ref 4.22–5.81)
RDW: 19.4 % — ABNORMAL HIGH (ref 11.5–15.5)
WBC: 3.8 10*3/uL — ABNORMAL LOW (ref 4.0–10.5)
nRBC: 0 % (ref 0.0–0.2)

## 2020-08-14 LAB — COMPREHENSIVE METABOLIC PANEL
ALT: 14 U/L (ref 0–44)
AST: 18 U/L (ref 15–41)
Albumin: 3.1 g/dL — ABNORMAL LOW (ref 3.5–5.0)
Alkaline Phosphatase: 93 U/L (ref 38–126)
Anion gap: 9 (ref 5–15)
BUN: 19 mg/dL (ref 8–23)
CO2: 26 mmol/L (ref 22–32)
Calcium: 8.1 mg/dL — ABNORMAL LOW (ref 8.9–10.3)
Chloride: 100 mmol/L (ref 98–111)
Creatinine, Ser: 1.3 mg/dL — ABNORMAL HIGH (ref 0.61–1.24)
GFR, Estimated: 58 mL/min — ABNORMAL LOW (ref >60)
Glucose, Bld: 122 mg/dL — ABNORMAL HIGH (ref 70–99)
Potassium: 3.7 mmol/L (ref 3.5–5.1)
Sodium: 135 mmol/L (ref 135–145)
Total Bilirubin: 1.3 mg/dL — ABNORMAL HIGH (ref 0.3–1.2)
Total Protein: 6 g/dL — ABNORMAL LOW (ref 6.5–8.1)

## 2020-08-14 LAB — GLUCOSE, CAPILLARY
Glucose-Capillary: 104 mg/dL — ABNORMAL HIGH (ref 70–99)
Glucose-Capillary: 104 mg/dL — ABNORMAL HIGH (ref 70–99)
Glucose-Capillary: 132 mg/dL — ABNORMAL HIGH (ref 70–99)
Glucose-Capillary: 235 mg/dL — ABNORMAL HIGH (ref 70–99)

## 2020-08-14 LAB — MAGNESIUM: Magnesium: 2 mg/dL (ref 1.7–2.4)

## 2020-08-14 LAB — BRAIN NATRIURETIC PEPTIDE: B Natriuretic Peptide: 414.2 pg/mL — ABNORMAL HIGH (ref 0.0–100.0)

## 2020-08-14 MED ORDER — PANTOPRAZOLE SODIUM 40 MG PO TBEC
40.0000 mg | DELAYED_RELEASE_TABLET | Freq: Every day | ORAL | Status: DC
  Administered 2020-08-14 – 2020-08-17 (×4): 40 mg via ORAL
  Filled 2020-08-14: qty 1
  Filled 2020-08-14: qty 2
  Filled 2020-08-14 (×2): qty 1

## 2020-08-14 NOTE — Progress Notes (Signed)
Occupational Therapy Treatment Patient Details Name: [name] MRN: [id number] DOB: 06/29/1946 Today's Date: 08/14/2020    History of present illness 74 yo male with onset of anasarca and pleural effusion was admitted for symptoms of GI bleed and had EGD on 12.13.  He is anemic at 5.5 initially, SOB and had epigastric pain.  EGD located gastric nonbleeding varices with portal hypertensive gastropathy.  PMHx:  liver mass, CHF, CAD, cirrhosis, transfusions, OSA, anemia, ankle fracture ORIF   OT comments  Pt sitting EOB upon arrival agreeable to OT intervention. Pt on RA upon arrival able to complete functional mobility from EOB<>bathroom with RW and MIN A for line mgmt and steadying assist as pt c/o pain in L knee from prior injury.  O2 briefly dropping to 86% however poor pleth noted and pts spO2 quickly rebounds with seated rest break. Pt with no c/o SOB during session and all other VSS. Education on all LB AE provided with pt needing MOD A for LB ADLs from EOB. Pt would continue to benefit from skilled occupational therapy while admitted and after d/c to address the below listed limitations in order to improve overall functional mobility and facilitate independence with BADL participation. DC plan remains appropriate, will follow acutely per POC.     Follow Up Recommendations  Home health OT;Supervision/Assistance - 24 hour    Equipment Recommendations  3 in 1 bedside commode    Recommendations for Other Services      Precautions / Restrictions Precautions Precautions: Fall Precaution Comments: monitor O2 sats Restrictions Weight Bearing Restrictions: No Other Position/Activity Restrictions: BLE edema; h/o painful L knee       Mobility Bed Mobility               General bed mobility comments: sitting EOB upon arrival and at end of session  Transfers Overall transfer level: Needs assistance Equipment used: Rolling walker (2 wheeled) Transfers: Sit to/from Stand Sit  to Stand: Min assist         General transfer comment: MIN A to power up from EOB and BSC over toilet    Balance Overall balance assessment: Needs assistance Sitting-balance support: Feet supported;No upper extremity supported Sitting balance-Leahy Scale: Fair Sitting balance - Comments: able to static sit EOB to eat with no LOB   Standing balance support: Bilateral upper extremity supported Standing balance-Leahy Scale: Poor Standing balance comment: reliant on bil UE support from RW                           ADL either performed or assessed with clinical judgement   ADL Overall ADL's : Needs assistance/impaired                     Lower Body Dressing: Moderate assistance;Minimal assistance;Sitting/lateral leans Lower Body Dressing Details (indicate cue type and reason): to don socks from EOB Toilet Transfer: Minimal assistance;RW;Ambulation;BSC Toilet Transfer Details (indicate cue type and reason): BSc over regular toilet; MIN A with RW for balance and equipment mgmt Toileting- Clothing Manipulation and Hygiene: Supervision/safety;Sitting/lateral lean Toileting - Clothing Manipulation Details (indicate cue type and reason): gross supervision     Functional mobility during ADLs: Minimal assistance;Rolling walker General ADL Comments: pt able to complete in room functional mobility with RW and MIN A, pt completed toileting tasks. Education provided on all LB AE for bathing and dressing with pt only interested in reacher for LB Dressing. pt on RA with O2 briefly dropping  to 86% however poor pleth noted and pts spO2 quickly rebounds with seated rest break     Vision       Perception     Praxis      Cognition Arousal/Alertness: Awake/alert Behavior During Therapy: WFL for tasks assessed/performed Overall Cognitive Status: No family/caregiver present to determine baseline cognitive functioning                                 General  Comments: seemed WFL for simple mobility tasks, slightly slow to process but also ? [facility]        Exercises Other Exercises Other Exercises: ankle pumps EOB x10 reps as edema mgmt strategy   Shoulder Instructions       General Comments pt on RA during session with SpO2 briefly dropping to 86% however poor pleth noted therfore difficult to obtain accurate reading pt denies any SOB. pt reports good family support at home, demo'ed all LB AE for bathing and dressing as ECS with pt reporting a reacher would be benefical    Pertinent Vitals/ Pain       Pain Assessment: Faces Faces Pain Scale: Hurts a little bit Pain Location: L knee with mobility Pain Descriptors / Indicators: Grimacing Pain Intervention(s): Limited activity within patient's tolerance;Monitored during session;Repositioned  Home Living                                          Prior Functioning/Environment              Frequency  Min 2X/week        Progress Toward Goals  OT Goals(current goals can now be found in the care plan section)  Progress towards OT goals: Progressing toward goals  Acute Rehab OT Goals Patient Stated Goal: decreased edema in BLE. OT Goal Formulation: With patient Time For Goal Achievement: 08/26/20 Potential to Achieve Goals: Good  Plan Discharge plan remains appropriate;Frequency remains appropriate    Co-evaluation                 AM-PAC OT "6 Clicks" Daily Activity     Outcome Measure   Help from another person eating meals?: None Help from another person taking care of personal grooming?: A Little Help from another person toileting, which includes using toliet, bedpan, or urinal?: A Little Help from another person bathing (including washing, rinsing, drying)?: A Little Help from another person to put on and taking off regular upper body clothing?: A Little Help from another person to put on and taking off regular lower body clothing?: A Lot 6 Click  Score: 18    End of Session Equipment Utilized During Treatment: Gait belt;Rolling walker  OT Visit Diagnosis: Unsteadiness on feet (R26.81);Muscle weakness (generalized) (M62.81);Pain Pain - Right/Left: Left Pain - part of body: Knee   Activity Tolerance Patient tolerated treatment well   Patient Left in bed;with call bell/phone within reach;with bed alarm set;Other (comment) (sitting EOB)   Nurse Communication Mobility status        Time: 0017-4944 OT Time Calculation (min): 27 min  Charges: OT General Charges $OT Visit: 1 Visit OT Treatments $Self Care/Home Management : 23-37 mins  Lanier Clam., COTA/L Acute Rehabilitation Services [phone]    Ihor Gully 08/14/2020, 3:54 PM

## 2020-08-14 NOTE — Progress Notes (Signed)
Progress Note  Patient Name: [name] Date of Encounter: 08/14/2020  [facility] Cardiologist: Mertie Moores, MD   Subjective   Reports dyspnea improved  Inpatient Medications    Scheduled Meds: . sodium chloride   Intravenous Once  . atorvastatin  40 mg Oral Daily  . furosemide  80 mg Intravenous BID  . gabapentin  100 mg Oral BID  . insulin aspart  0-20 Units Subcutaneous TID WC  . insulin glargine  10 Units Subcutaneous QHS  . ipratropium-albuterol  3 mL Nebulization BID  . pantoprazole (PROTONIX) IV  40 mg Intravenous Q24H   Continuous Infusions: . sodium chloride Stopped (08/10/20 0249)  . octreotide  (SANDOSTATIN)    IV infusion 50 mcg/hr (08/14/20 0145)   PRN Meds: acetaminophen **OR** acetaminophen, albuterol, morphine injection, nitroGLYCERIN, senna, sodium chloride   Vital Signs    Vitals:   08/14/20 0437 08/14/20 0700 08/14/20 0744 08/14/20 0817  BP: 125/67  (!) 132/58   Pulse: 60     Resp: 20     Temp: 98.3 F (36.8 C)     TempSrc: Oral     SpO2: 94%   96%  Weight:  113.5 kg    Height:        Intake/Output Summary (Last 24 hours) at 08/14/2020 0828 Last data filed at 08/14/2020 0438 Gross per 24 hour  Intake 420 ml  Output 3400 ml  Net -2980 ml   Last 3 Weights 08/14/2020 08/13/2020 08/11/2020  Weight (lbs) 250 lb 4.8 oz 255 lb 3.2 oz 252 lb 3.3 oz  Weight (kg) 113.535 kg 115.758 kg 114.4 kg      Telemetry    Normal sinus rhythm in 60s- Personally Reviewed  ECG    No new ECG- Personally Reviewed  Physical Exam   GEN: No acute distress.   Neck:+ JVD Cardiac: RRR, no murmurs, rubs, or gallops.  Respiratory: Expiratory wheezing GI: Soft, nontender MS: 2+ BLE edema Neuro:  Nonfocal  Psych: Normal affect   Labs    High Sensitivity Troponin:   Recent Labs  Lab 08/09/20 1733 08/09/20 1930  TROPONINIHS 13 13      Chemistry Recent Labs  Lab 08/12/20 0150 08/13/20 0047 08/14/20 0350  NA 138 137 135  K 3.8 4.0  3.7  CL 102 102 100  CO2 26 26 26   GLUCOSE 107* 134* 122*  BUN 21 20 19   CREATININE 1.30* 1.36* 1.30*  CALCIUM 8.2* 8.2* 8.1*  PROT 6.2* 6.5 6.0*  ALBUMIN 3.3* 3.3* 3.1*  AST 22 20 18   ALT 16 15 14   ALKPHOS 100 98 93  BILITOT 1.7* 1.6* 1.3*  GFRNONAA 58* 55* 58*  ANIONGAP 10 9 9      Hematology Recent Labs  Lab 08/12/20 0150 08/13/20 0047 08/14/20 0350  WBC 4.9 4.5 3.8*  RBC 3.17* 3.53* 3.26*  HGB 7.6* 8.2* 7.5*  HCT 25.5* 28.9* 26.4*  MCV 80.4 81.9 81.0  MCH 24.0* 23.2* 23.0*  MCHC 29.8* 28.4* 28.4*  RDW 19.3* 19.3* 19.4*  PLT 124* 100* 111*    BNP Recent Labs  Lab 08/09/20 1926 08/14/20 0353  BNP 326.3* 414.2*     DDimer No results for input(s): DDIMER in the last 168 hours.   Radiology    DG CHEST PORT 1 VIEW  Result Date: 08/14/2020 CLINICAL DATA:  Dyspnea EXAM: PORTABLE CHEST 1 VIEW COMPARISON:  08/09/2020 FINDINGS: Cardiac shadow remains enlarged. Aortic calcifications are again seen. Persistent vascular congestion is noted but significantly improved when compared  with the prior exam. Mild edema remains as well. Small left pleural effusion is seen. No focal infiltrate is noted. IMPRESSION: Improved CHF when compare with the prior exam. Electronically Signed   By: Inez Catalina M.D.   On: 08/14/2020 08:23    Cardiac Studies   Echo 05/27/20: 1. Left ventricular ejection fraction, by estimation, is 60 to 65%. The  left ventricle has normal function. The left ventricle has no regional  wall motion abnormalities. Left ventricular diastolic parameters are  consistent with Grade III diastolic  dysfunction (restrictive).  2. Right ventricular systolic function is normal. The right ventricular  size is normal. There is mildly elevated pulmonary artery systolic  pressure. The estimated right ventricular systolic pressure is 75.1 mmHg.  3. Left atrial size was mildly dilated.  4. Right atrial size was mild to moderately dilated.  5. The mitral valve is  normal in structure. No evidence of mitral valve  regurgitation. The mean mitral valve gradient is 3.0 mmHg with average  heart rate of 69 bpm. Moderate mitral annular calcification.  6. The aortic valve is tricuspid. There is moderate calcification of the  aortic valve. Aortic valve regurgitation is trivial. Mild aortic valve  stenosis. Aortic valve mean gradient measures 10.0 mmHg.  7. Aortic dilatation noted. There is mild dilatation of the ascending  aorta, measuring 42 mm.  8. The inferior vena cava is dilated in size with >50% respiratory  variability, suggesting right atrial pressure of 8 mmHg.   Patient Profile     74 y.o. male with a PMH of chronic diastolic CHF, CAD s/p CABG 2015, HTN, HLD, DM type 2, OSA, cirrhosis, hepatocellular carcinoma,  CKD stage 3, who presented with GI bleed with acute blood loss anemia, who is being seen today for the evaluation of CHF.  Assessment & Plan    1. Acute on chronic diastolic CHF: patient presented with volume overload. He had pulmonary edema on CXR. BNP 414. Anasarca on exam. - Continue to monitor strict I&Os and daily weights - Continue to monitor electrolytes and replete as needed to maintain K>4, Mg>2 -Good response yesterday to IV Lasix 80 mg twice daily, will continue  2. CAD s/p CABG: No complaints of chest pain this admission despite acute on chronic anemia with Hgb 5.5. EKG is non-ischemic. Not on aspirin due to chronic anemia and history of multiple GI bleeds. Not on BBlocker due to baseline bradycardia -  Continue statin  3. HTN: Had some soft blood pressures earlier in the admission. Home losartan on hold. Favor mild permissive hypertension while diuresing - Anticipate restarting losartan prior to discharge  - Continue lasix as above  4. HLD: LDL 93 10/2019; goal <70. LFT's appear stable, though with underlying hepatocellular carcinoma and cirrhosis.  - Continue atorvastatin  5. DM type 2: A1C 5.3; goal <7 - Continue  management per primary team  6. OSA: not compliant with CPAP - Would benefit from regular CPAP use  7. CKD stage 2-3: Cr 1.3 - Continue to monitor closely with diuresis  Remainder of care per primary team: - Acute on chronic anemia 2/2 acute blood loss anemia - Decompensated alcoholic liver cirrhosis - Thrombocytopenia - Hepatocellular carcinoma    For questions or updates, please contact [facility] Please consult [email or web address] for contact info under        Signed, Donato Heinz, MD  08/14/2020, 8:28 AM

## 2020-08-14 NOTE — TOC Progression Note (Signed)
Transition of Care [facility]) - Progression Note    Patient Details  Name: [name] MRN: [id number] Date of Birth: 1945-09-14  [facility] CM/SW Contact  Joanne Chars, LCSW Phone Number: 08/14/2020, 3:26 PM  Clinical Narrative:   CSW spoke with Ennis Forts at [facility]--they can accept pt but cannot see him until the week after Christmas.  CSW spoke with Levada Dy at [facility] called and they cannot accept this [employer].  Ennis Forts will call back with date they can initiate services at [place].     Expected Discharge Plan: [place] Barriers to Discharge: Continued Medical Work up  Expected Discharge Plan and Services Expected Discharge Plan: [place] Choice: [place] arrangements for the past 2 months: Single Family Home                                       Social Determinants of Health (SDOH) Interventions    Readmission Risk Interventions Readmission Risk Prevention Plan 08/12/2020 08/12/2020 12/30/2019  Transportation Screening - Complete Complete  PCP or Specialist Appt within 5-7 Days - - -  Not Complete comments - - -  PCP or Specialist Appt within 3-5 Days Complete - -  Home Care Screening - - -  Medication Review (RN CM) - - -  HRI or [place] - Complete -  Social Work Consult for [place] Planning/Counseling - Complete -  Palliative Care Screening - Not Applicable Not Applicable  Medication Review (RN Care Manager) - - Complete  Some recent data might be hidden

## 2020-08-14 NOTE — Progress Notes (Addendum)
PROGRESS NOTE    [name]  ZDG:[id number] DOB: 06-07-1946 DOA: 08/09/2020 PCP: [name], MD   Brief Narrative:  HPI per Dr. Adella Hare on 08/09/20 [name] is a 74 y.o. male with medical history significant of CAD s/p CABG, COPD,  HFpEF, Cirrhosis of the liver with known varices, h/o GI bleeding, hepatocellular carcinoma developed acute SOB/DOE at home. He also had increased peripheral edema. He reports having black stools. Due to his acute symptoms he presents to [facility] for evaluation.   ED Course: T 97.9  131/47  HR 74  RR 16. ED exam revealed patient to have SOB, wheezing and rales on lung exam,  anasarca to lover abdomen. Lab revelaed Cr 1.23, BNP 326.3, Trop #1 13, #2 13, Hgb 5.5 down from baseline of approximately 8. CXR reveals increased pulmonary edema. In the ED he was given IV lasix 40 mg, he was to be crossmatched and transfused 2 uPRBCs, he was to be started on IV PPI with bolus and infusion. GI consulted who agreed to see patient 08/10/20. [facility] called to admit patient for continued management of GI bleed and acute on Chronic HFpEF.  **Interim History Gastroenterology evaluated and the patient to an EGD and that showed severe portal hypertensive gastropathy in the stomach with diffuse friable mucosa with contact bleeding.  There is also noted to have grade 1 esophageal varices and type I isolated gastric varices with no bleeding or stigmata of recent bleeding.  Patient remained on octreotide drip and was also on a Protonix drip and his octreotide drip stopped on 08/13/2020 and his Protonix drip was changed to IV Protonix 40 mg daily by gastroenterology.  Patient remains significantly volume overloaded so cardiology was consulted for further assistance with diuresis and because it was felt the patient had acute on chronic diastolic CHF exacerbation.  Cardiology has now doubled the patient's diuresis and increase this to 80 twice daily  Patient continues to diurese  well and his weight is improving but still not back to dry weight so cardiology recommending continuing the current dose of IV Lasix of 80 mg every 12h.  PT OT recommending home health when he is medically stable for discharge  Assessment & Plan:   Active Problems:   Type 2 diabetes with complication (HCC)   OSA (obstructive sleep apnea)   Hyperlipidemia with target LDL less than 70   GI bleed   CKD (chronic kidney disease), stage III (HCC)   (HFpEF) heart failure with preserved ejection fraction (HCC)   Hepatocellular carcinoma (HCC)  Acute GI Bleed in the setting of Portal Gastropathy History of Gastric Varices -Patient was FOBT positive, with melanotic stools -Hemoglobin dropped to 5.5 on presentation, baseline around 8; Now Hgb/Hct is 7.6/25.5 the day before yesterday and is improved to 8.2/28.9 yesterday morning but is now 7.5/26.4 -Last EGD in August/21 showed gastric varices, nonbleeding -GI consulted, EGD on 08/10/2020 showed severe portal hypertensive gastropathy in the stomach with diffuse friable mucosa with contact bleeding, Grade 1 esophageal varices and type I isolated gastric varices was noted with no bleeding or stigmata of recent bleeding -Patient has not had any recurrent bleeding and hemoglobin has now dropped to 7.6 as above -Gastroenterology was recommending completing 72 hours of octreotide (to end 08/13/20) and also discontinue  patient's Protonix drip and change to IV Protonix daily and recommending continue PPI daily as an outpatient; octreotide drip is now stopped and patient was on IV PPI but will change this to p.o. -Diet  has been advanced to a low sodium diet and recommending the patient be discharged in the next 1 to 2 days if stable -Patient to be followed by Dr. Michail Sermon in the outpatient setting in 4 weeks -Patient is stable from a GI perspective but given his heart failure we will have cardiology evaluate prior to safe discharge disposition given that he is  extremely volume overloaded -Continue Monitor closely for signs and symptoms of bleeding  Acute on chronic iron deficiency blood loss anemia -Likely 2/2 above -Hemoglobin dropped to 5.5 on presentation, baseline around 8; Now Hgb/Hct is 8.2/20.9 yesterday and today it is 7.5/26.4 -S/p 3 units of PRBC on 08/10/2020, another 1 unit of PRBC on 08/11/2020 -Continue to monitor for signs and symptoms of bleeding; currently no overt bleeding noted Repeat CBC in a.m.  Decompensated alcoholic liver cirrhosis Hyperbilirubinemia -Noted to have an anasarca, oriented -LFTs WNL, T bili 1.4 -> 1.7 -> 1.6 -> 1.3 today, will continue to Trend  -RUQ Ultrasound, unchanged, no ascites -IV Lasix was changed to 80 mg every 12 -Hold home Torsemide, Aldactone for now and will resume once he is more diuresed; cardiology consulted for his volume overload  Thrombocytopenia -Patient's Platelet Count went from 126 -> 124 and dropped to 100 yesterday and today is 100 and -Continue to Monitor for S/Sx of Bleeding -Repeat CBC in AM   Acute on Chronic Grade 3 Diastolic HF -Noted Anasarca -BNP 326 and then trended up to 414.2 -Chest x-ray showed increased pulmonary edema; repeat chest x-ray in a.m. -Troponin WNL, EKG with no acute ST changes -Last echo done on 04/2020 showed EF of 60 to 65% with grade 3 diastolic dysfunction -Continue IV Lasix 40 mg q12h, hold home losartan for now -Cardiology consulted on 08/11/2020 but consult not put in so will do it now to have them assist with diuresis -Strict I's and O's, daily weights; Patient is Now -3.473 liters; weight was up 20 pounds from 08/10/2019 but now patient is only up 15 pounds and has diuresed and dropped 5 pounds from yesterday -We will advance his diet to low-sodium diet with 2 g sodium restriction and restriction to 1500 mL as well -Cardiology consulted for further assistance and recommendations and Dr. Nechama Guard has increased his IV Lasix to 80 mg twice daily  and that he continues to have lung crackles as well as 3+ lower extremity edema -Repeat chest x-ray this AM shows "Cardiac shadow remains enlarged. Aortic calcifications are again seen. Persistent vascular congestion is noted but significantly improved when compared with the prior exam. Mild edema remains as well. Small left pleural effusion is seen. No focal infiltrate is noted."   Hypokalemia -Patient's K+ was 3.3 and repeat is now 3.7 -Continue to Monitor and Replete as Necessary -Repeat CMP in the AM   History of Asthma -Currently not in exacerbation X-continue with DuoNeb 3 mils twice daily, as well as albuterol neb 2.5 mg every 2 hours as needed for shortness of breath -Supplemental oxygen via nasal cannula -SpO2: 96 % O2 Flow Rate (L/min): 1 L/min FiO2 (%): 21 % -Patient will need an ambulatory home O2 screen prior to discharge; he was off of supplemental oxygen via nasal cannula  CAD -Involving the native coronary artery of the heart without angina pectoris-he has severe left main disease on his cath in 2015 and underwent CABG x3 -Continue with nitroglycerin 0.4 mg sublingual every 5 minutes as needed chest pain, atorvastatin 40 once p.o. daily -Currently not on a beta-blocker due to  his baseline bradycardia and not on aspirin due to chronic anemia and history of multiple GI bleeds -We will consult cardiology for further assistance but is losartan has been held for now -Dr. Gardiner Rhyme recommends continuing the patient's statin as above  GERD -C/w PPI as above  but will be changed  CKD Stage II  to p.o.-Creatinine stable at baseline -Continue to Monitor as Patient is getting diuresis -BUN/Cr now gone 27/1.25 -> 31/1.20 -> 21/1.25 -> 21/1.30 and patient's BUNs/creatinine yesterday is 20/1.36 and today is 19/1.30 -Avoid Nephrotoxic Medications, Contrast Dyes, Avoid Hypotension and Renally adjust Medications -Continue to Monitor and Trend -Repeat CMP  Pancytopenia -Patient's  WBC was 3.8, hemoglobin/hematocrit was 7.5/26.4, and platelet count was 111 -In the setting of his liver cirrhosis as above -Continue monitor and trend and continue to monitor for signs and symptoms of bleeding -Repeat CBC in a.m.  Diabetes Mellitus Type 2 -Last A1c 5.3% on 08/10/20 -Bolus to keep his hemoglobin A1c less than 7 -Continue SSI, Accu-Cheks, Lantus, hypoglycemic protocol -Hold Home Regimen -CBGs have been ranging from 106-235  Hypertension -BP was soft the day before yesterday yesterday  -His Losartan had been held; [place] can resume in the AM and cardiology anticipating resuming prior to discharge -Continue to Monitor BP per Protocol -Last BP was 118/66  Hepatocellular Carcinoma -Patient currently follows with IR; patient had chemoembolization in November -Continue outpatient follow-up  HLD -C/w Atorvastatin 40 mg po Daily   OSA -Noncompliant with CPAP  Obesity -Complicates overall care and prognosis -Estimated body mass index is 38.06 kg/m as calculated from the following:   Height as of this encounter: 5\' 8"  (1.727 m).   Weight as of this encounter: 113.5 kg. -Weight Loss and Dietary Counseling given  Lifestyle modification advised  [place]erm[place] -Patient with multiple comorbidities, overall poor prognosis -Palliative consulted-initially agreed to be DNR, but changed his mind and wants to be full code  DVT prophylaxis: SCDs Code Status: FULL CODE  Family Communication: No family present at bedside  Disposition Plan: Pending further clinical improvement and diuresis back to dry weight as gastroenterology has signed off the patient now.  Patient remains significantly volume overloaded but will go home with home health once cleared by cardiology and transition back to p.o. diuretic  Status is: Inpatient  Remains inpatient appropriate because:Unsafe d/c plan, IV treatments appropriate due to intensity of illness or inability to take PO and Inpatient level of  care appropriate due to severity of illness   Dispo: The patient is from: Home              Anticipated d/c is to: Home              Anticipated d/c date is: 2 days              Patient currently is not medically stable to d/c.  Consultants:   Gastroenterology  Palliative Care Medicine  Cardiology    Procedures: EGD Findings:      Grade I varices were found in the lower third of the esophagus. No       stigmata of bleeding.      The Z-line was regular and was found 45 cm from the incisors.      Severe portal hypertensive gastropathy was found in the cardia, in the       gastric fundus and in the gastric body.      Diffuse friable mucosa with contact bleeding was found in the cardia, in  the gastric fundus and in the gastric body.      Type 1 isolated gastric varices (IGV1, varices located in the fundus)       with no bleeding were found in the gastric fundus. There were no       stigmata of recent bleeding. They were medium in largest diameter.      The examined duodenum was normal. Impression:               - Grade I esophageal varices.                           - Z-line regular, 45 cm from the incisors.                           - Portal hypertensive gastropathy.                           - Friable gastric mucosa.                           - Type 1 isolated gastric varices (IGV1, varices                            located in the fundus), without bleeding.                           - Normal examined duodenum.                           - No specimens collected. Recommendation:           - Clear liquid diet.                           - Observe patient's clinical course.                           - Continue Octreotide and Protonix drips.  Antimicrobials:  Anti-infectives (From admission, onward)   None        Subjective: Seen and examined at bedside and patient thinks his dyspnea is getting better but his leg swelling is still there.  Denies any abdominal pain,  nausea, vomiting.  Denies any more bloody bowel movements at this time and states that he had 3 bowel movements yesterday.  No other concerns or complaints at this time.  Objective: Vitals:   08/14/20 0700 08/14/20 0744 08/14/20 0817 08/14/20 1201  BP:  (!) 132/58  118/66  Pulse:      Resp:      Temp:      TempSrc:      SpO2:   96%   Weight: 113.5 kg     Height:        Intake/Output Summary (Last 24 hours) at 08/14/2020 1446 Last data filed at 08/14/2020 1100 Gross per 24 hour  Intake 1966.23 ml  Output 4000 ml  Net -2033.77 ml   Filed Weights   08/11/20 0509 08/13/20 1300 08/14/20 0700  Weight: 114.4 kg 115.8 kg 113.5 kg   Examination: Physical Exam:  Constitutional: WN/WD obese Caucasian male who is chronically ill-appearing in no acute distress and more comfortable and  not as short of breath Eyes: Lids and conjunctivae normal, sclerae anicteric  ENMT: External Ears, Nose appear normal. Grossly normal hearing.  Neck: Appears normal, supple, no cervical masses, normal ROM, no appreciable thyromegaly; slight JVD Respiratory: Diminished to auscultation bilaterally, no wheezing, rales, rhonchi or crackles. Normal respiratory effort and patient is not tachypenic. No accessory muscle use.  Unlabored breathing and is not wearing supplemental oxygen via nasal cannula Cardiovascular: RRR, no murmurs / rubs / gallops. S1 and S2 auscultated.  Has 2+ lower extremity Abdomen: Soft, non-tender, distended secondary to body habitus. Bowel sounds positive.  GU: Deferred. Musculoskeletal: No clubbing / cyanosis of digits/nails. No joint deformity upper and lower extremities.  Skin: No rashes, lesions, ulcers on limited skin evaluation. No induration; Warm and dry.  Neurologic: CN 2-12 grossly intact with no focal deficits. Romberg sign and cerebellar reflexes not assessed.  Psychiatric: Normal judgment and insight. Alert and oriented x 3. Normal mood and appropriate affect.   Data Reviewed:  I have personally reviewed following labs and imaging studies  CBC: Recent Labs  Lab 08/09/20 1733 08/10/20 0605 08/11/20 0306 08/11/20 1521 08/12/20 0150 08/13/20 0047 08/14/20 0350  WBC 3.5*  --  4.3  --  4.9 4.5 3.8*  NEUTROABS  --   --  3.4  --  4.0 3.7 2.9  HGB 5.5*   < > 7.0* 8.2* 7.6* 8.2* 7.5*  HCT 20.0*   < > 25.0* 29.2* 25.5* 28.9* 26.4*  MCV 79.7*  --  82.0  --  80.4 81.9 81.0  PLT 138*  --  126*  --  124* 100* 111*   < > = values in this interval not displayed.   Basic Metabolic Panel: Recent Labs  Lab 08/10/20 0548 08/10/20 1322 08/11/20 0306 08/12/20 0150 08/13/20 0047 08/14/20 0350  NA 139 141 139 138 137 135  K 3.3* 3.6 3.3* 3.8 4.0 3.7  CL 102 101 103 102 102 100  CO2 26  --  26 26 26 26   GLUCOSE 79 87 113* 107* 134* 122*  BUN 27* 31* 21 21 20 19   CREATININE 1.25* 1.20 1.25* 1.30* 1.36* 1.30*  CALCIUM 8.6*  --  8.3* 8.2* 8.2* 8.1*  MG  --   --   --   --  2.0 2.0  PHOS  --   --   --   --  3.3 3.2   GFR: Estimated Creatinine Clearance: 60.9 mL/min (A) (by C-G formula based on SCr of 1.3 mg/dL (H)). Liver Function Tests: Recent Labs  Lab 08/10/20 0548 08/11/20 0306 08/12/20 0150 08/13/20 0047 08/14/20 0350  AST 22 23 22 20 18   ALT 15 16 16 15 14   ALKPHOS 106 106 100 98 93  BILITOT 1.4* 1.5* 1.7* 1.6* 1.3*  PROT 6.5 6.3* 6.2* 6.5 6.0*  ALBUMIN 3.3* 3.2* 3.3* 3.3* 3.1*   No results for input(s): LIPASE, AMYLASE in the last 168 hours. No results for input(s): AMMONIA in the last 168 hours. Coagulation Profile: Recent Labs  Lab 08/10/20 1525  INR 1.2   Cardiac Enzymes: No results for input(s): CKTOTAL, CKMB, CKMBINDEX, TROPONINI in the last 168 hours. BNP (last 3 results) No results for input(s): PROBNP in the last 8760 hours. HbA1C: No results for input(s): HGBA1C in the last 72 hours. CBG: Recent Labs  Lab 08/13/20 1614 08/13/20 2035 08/14/20 0039 08/14/20 0742 08/14/20 1159  GLUCAP 186* 159* 132* 104* 235*   Lipid  Profile: No results for input(s): CHOL, HDL, LDLCALC, TRIG, CHOLHDL, LDLDIRECT  in the last 72 hours. Thyroid Function Tests: No results for input(s): TSH, T4TOTAL, FREET4, T3FREE, THYROIDAB in the last 72 hours. Anemia Panel: No results for input(s): VITAMINB12, FOLATE, FERRITIN, TIBC, IRON, RETICCTPCT in the last 72 hours. Sepsis Labs: No results for input(s): PROCALCITON, LATICACIDVEN in the last 168 hours.  Recent Results (from the past 240 hour(s))  Resp Panel by RT-PCR (Flu A&B, Covid) Nasopharyngeal Swab     Status: None   Collection Time: 08/09/20  7:47 PM   Specimen: Nasopharyngeal Swab; Nasopharyngeal(NP) swabs in vial transport medium  Result Value Ref Range Status   SARS Coronavirus 2 by RT PCR NEGATIVE NEGATIVE Final    Comment: (NOTE) SARS-CoV-2 target nucleic acids are NOT DETECTED.  The SARS-CoV-2 RNA is generally detectable in upper respiratory specimens during the acute phase of infection. The lowest concentration of SARS-CoV-2 viral copies this assay can detect is 138 copies/mL. A negative result does not preclude SARS-Cov-2 infection and should not be used as the sole basis for treatment or other patient management decisions. A negative result may occur with  improper specimen collection/handling, submission of specimen other than nasopharyngeal swab, presence of viral mutation(s) within the areas targeted by this assay, and inadequate number of viral copies(<138 copies/mL). A negative result must be combined with clinical observations, patient history, and epidemiological information. The expected result is Negative.  Fact Sheet for Patients:  EntrepreneurPulse.com.au  Fact Sheet for Healthcare Providers:  IncredibleEmployment.be  This test is no t yet approved or cleared by the [place] FDA and  has been authorized for detection and/or diagnosis of SARS-CoV-2 by FDA under an Emergency Use Authorization (EUA). This EUA  will remain  in effect (meaning this test can be used) for the duration of the COVID-19 declaration under Section 564(b)(1) of the Act, 21 U.S.C.section 360bbb-3(b)(1), unless the authorization is terminated  or revoked sooner.       Influenza A by PCR NEGATIVE NEGATIVE Final   Influenza B by PCR NEGATIVE NEGATIVE Final    Comment: (NOTE) The Xpert Xpress SARS-CoV-2/FLU/RSV plus assay is intended as an aid in the diagnosis of influenza from Nasopharyngeal swab specimens and should not be used as a sole basis for treatment. Nasal washings and aspirates are unacceptable for Xpert Xpress SARS-CoV-2/FLU/RSV testing.  Fact Sheet for Patients: EntrepreneurPulse.com.au  Fact Sheet for Healthcare Providers: IncredibleEmployment.be  This test is not yet approved or cleared by the [place] FDA and has been authorized for detection and/or diagnosis of SARS-CoV-2 by FDA under an Emergency Use Authorization (EUA). This EUA will remain in effect (meaning this test can be used) for the duration of the COVID-19 declaration under Section 564(b)(1) of the Act, 21 U.S.C. section 360bbb-3(b)(1), unless the authorization is terminated or revoked.  Performed at [facility], [place], [place], [place] [address]      RN Pressure Injury Documentation:     Estimated body mass index is 38.06 kg/m as calculated from the following:   Height as of this encounter: 5\' 8"  (1.727 m).   Weight as of this encounter: 113.5 kg.  Malnutrition Type:   Malnutrition Characteristics:   Nutrition Interventions:     Radiology Studies: DG CHEST PORT 1 VIEW  Result Date: 08/14/2020 CLINICAL DATA:  Dyspnea EXAM: PORTABLE CHEST 1 VIEW COMPARISON:  08/09/2020 FINDINGS: Cardiac shadow remains enlarged. Aortic calcifications are again seen. Persistent vascular congestion is noted but significantly improved when compared with the prior exam. Mild edema remains  as well. Small left  pleural effusion is seen. No focal infiltrate is noted. IMPRESSION: Improved CHF when compare with the prior exam. Electronically Signed   By: Inez Catalina M.D.   On: 08/14/2020 08:23   Scheduled Meds: . sodium chloride   Intravenous Once  . atorvastatin  40 mg Oral Daily  . furosemide  80 mg Intravenous BID  . gabapentin  100 mg Oral BID  . insulin aspart  0-20 Units Subcutaneous TID WC  . insulin glargine  10 Units Subcutaneous QHS  . ipratropium-albuterol  3 mL Nebulization BID  . pantoprazole  40 mg Oral QHS   Continuous Infusions: . sodium chloride Stopped (08/10/20 0249)     LOS: 5 days   Kerney Elbe, DO [facility] PAGER is on AMION  If 7PM-7AM, please contact night-coverage [email or web address]

## 2020-08-14 NOTE — Progress Notes (Signed)
Physical Therapy Treatment Patient Details Name: [name] MRN: [id number] DOB: 04-May-1946 Today's Date: 08/14/2020    History of Present Illness 74 yo male with onset of anasarca and pleural effusion was admitted for symptoms of GI bleed and had EGD on 12.13.  He is anemic at 5.5 initially, SOB and had epigastric pain.  EGD located gastric nonbleeding varices with portal hypertensive gastropathy.  PMHx:  liver mass, CHF, CAD, cirrhosis, transfusions, OSA, anemia, ankle fracture ORIF    PT Comments    Pt ambulated on room air, above 90% SpO2 at rest but desatted on room air with ambulation and stairs.  Shows increased gait distance this session and ability to navigate 1 stair, all which is necessary to return home. Overall deficits include decreased endurance with functional mobility and decreased dynamic standing balance.  Room air after stairs: 85% SpO2, returned to above 90% SpO2 after 30 seconds Room air after ambulation: 85% SpO2, returned to above 90% SpO2 after 30 seconds Room air resting after session: 95% SpO2  Follow Up Recommendations  Home health PT;Supervision for mobility/OOB     Equipment Recommendations  Other (comment) (oxygen)    Recommendations for Other Services       Precautions / Restrictions Precautions Precautions: Fall Precaution Comments: monitor O2 sats Restrictions Weight Bearing Restrictions: No Other Position/Activity Restrictions: BLE edema; h/o painful L knee    Mobility  Bed Mobility               General bed mobility comments: sitting EOB on arrival and in chair at end of session  Transfers Overall transfer level: Needs assistance Equipment used: Rolling walker (2 wheeled) Transfers: Sit to/from Stand Sit to Stand: Min assist         General transfer comment: min assist to help with power up  Ambulation/Gait Ambulation/Gait assistance: Min guard Gait Distance (Feet): 300 Feet Assistive device: Rolling walker (2  wheeled) Gait Pattern/deviations: Step-to pattern;Decreased stride length;Wide base of support;Trunk flexed   Gait velocity interpretation: <1.8 ft/sec, indicate of risk for recurrent falls     Stairs Stairs: Yes Stairs assistance: Min guard Stair Management: Two rails Number of Stairs: 1 General stair comments: went forward on one step and then going back down went in the L posterolateral direction   Wheelchair Mobility    Modified Rankin (Stroke Patients Only)       Balance Overall balance assessment: Needs assistance Sitting-balance support: Bilateral upper extremity supported;No upper extremity supported;Feet supported Sitting balance-Leahy Scale: Fair Sitting balance - Comments: able to maintain EOB sitting without bil UE support   Standing balance support: Bilateral upper extremity supported Standing balance-Leahy Scale: Poor Standing balance comment: reliant on bil UE support from RW                            Cognition Arousal/Alertness: Awake/alert Behavior During Therapy: [facility] for tasks assessed/performed Overall Cognitive Status: No family/caregiver present to determine baseline cognitive functioning                                 General Comments: may have issues with HOH or delayed processing with cues; kept messing with IV's; asked pt if this was his normal walking speed and he said yes, then later he said he was walking slower than normal      Exercises      General Comments  Pertinent Vitals/Pain Pain Assessment: Faces Faces Pain Scale: Hurts a little bit Pain Location: L knee Pain Descriptors / Indicators: Grimacing;Guarding Pain Intervention(s): Monitored during session;Limited activity within patient's tolerance;Repositioned    Home Living                      Prior Function            PT Goals (current goals can now be found in the care plan section) Acute Rehab PT Goals Patient Stated Goal:  decreased edema in BLE. Progress towards PT goals: Progressing toward goals    Frequency    Min 3X/week      PT Plan Current plan remains appropriate    Co-evaluation              AM-PAC PT "6 Clicks" Mobility   Outcome Measure  Help needed turning from your back to your side while in a flat bed without using bedrails?: A Little Help needed moving from lying on your back to sitting on the side of a flat bed without using bedrails?: A Little Help needed moving to and from a bed to a chair (including a wheelchair)?: A Little Help needed standing up from a chair using your arms (e.g., wheelchair or bedside chair)?: A Little Help needed to walk in hospital room?: A Little Help needed climbing 3-5 steps with a railing? : A Little 6 Click Score: 18    End of Session Equipment Utilized During Treatment: Gait belt Activity Tolerance: Patient tolerated treatment well Patient left: in chair;with call bell/phone within reach;with chair alarm set Nurse Communication: Mobility status PT Visit Diagnosis: Unsteadiness on feet (R26.81);Muscle weakness (generalized) (M62.81);Difficulty in walking, not elsewhere classified (R26.2)     Time: 0962-8366 PT Time Calculation (min) (ACUTE ONLY): 27 min  Charges:  $Gait Training: 23-37 mins                     [place], SPT [id number]   Travaughn Vue 08/14/2020, 11:09 AM

## 2020-08-14 NOTE — Plan of Care (Signed)

## 2020-08-15 ENCOUNTER — Inpatient Hospital Stay (HOSPITAL_COMMUNITY): Payer: Medicare HMO

## 2020-08-15 LAB — COMPREHENSIVE METABOLIC PANEL
ALT: 15 U/L (ref 0–44)
AST: 20 U/L (ref 15–41)
Albumin: 3.2 g/dL — ABNORMAL LOW (ref 3.5–5.0)
Alkaline Phosphatase: 107 U/L (ref 38–126)
Anion gap: 8 (ref 5–15)
BUN: 25 mg/dL — ABNORMAL HIGH (ref 8–23)
CO2: 28 mmol/L (ref 22–32)
Calcium: 8.4 mg/dL — ABNORMAL LOW (ref 8.9–10.3)
Chloride: 101 mmol/L (ref 98–111)
Creatinine, Ser: 1.21 mg/dL (ref 0.61–1.24)
GFR, Estimated: >60 mL/min (ref >60)
Glucose, Bld: 146 mg/dL — ABNORMAL HIGH (ref 70–99)
Potassium: 3.8 mmol/L (ref 3.5–5.1)
Sodium: 137 mmol/L (ref 135–145)
Total Bilirubin: 0.9 mg/dL (ref 0.3–1.2)
Total Protein: 6.4 g/dL — ABNORMAL LOW (ref 6.5–8.1)

## 2020-08-15 LAB — CBC WITH DIFFERENTIAL/PLATELET
Abs Immature Granulocytes: 0.01 10*3/uL (ref 0.00–0.07)
Basophils Absolute: 0 10*3/uL (ref 0.0–0.1)
Basophils Relative: 1 %
Eosinophils Absolute: 0.1 10*3/uL (ref 0.0–0.5)
Eosinophils Relative: 4 %
HCT: 27 % — ABNORMAL LOW (ref 39.0–52.0)
Hemoglobin: 7.6 g/dL — ABNORMAL LOW (ref 13.0–17.0)
Immature Granulocytes: 0 %
Lymphocytes Relative: 11 %
Lymphs Abs: 0.4 10*3/uL — ABNORMAL LOW (ref 0.7–4.0)
MCH: 22.9 pg — ABNORMAL LOW (ref 26.0–34.0)
MCHC: 28.1 g/dL — ABNORMAL LOW (ref 30.0–36.0)
MCV: 81.3 fL (ref 80.0–100.0)
Monocytes Absolute: 0.3 10*3/uL (ref 0.1–1.0)
Monocytes Relative: 8 %
Neutro Abs: 2.6 10*3/uL (ref 1.7–7.7)
Neutrophils Relative %: 76 %
Platelets: 115 10*3/uL — ABNORMAL LOW (ref 150–400)
RBC: 3.32 MIL/uL — ABNORMAL LOW (ref 4.22–5.81)
RDW: 19.6 % — ABNORMAL HIGH (ref 11.5–15.5)
WBC: 3.5 10*3/uL — ABNORMAL LOW (ref 4.0–10.5)
nRBC: 0 % (ref 0.0–0.2)

## 2020-08-15 LAB — GLUCOSE, CAPILLARY
Glucose-Capillary: 174 mg/dL — ABNORMAL HIGH (ref 70–99)
Glucose-Capillary: 121 mg/dL — ABNORMAL HIGH (ref 70–99)
Glucose-Capillary: 205 mg/dL — ABNORMAL HIGH (ref 70–99)
Glucose-Capillary: 79 mg/dL (ref 70–99)
Glucose-Capillary: 178 mg/dL — ABNORMAL HIGH (ref 70–99)

## 2020-08-15 LAB — MAGNESIUM: Magnesium: 2 mg/dL (ref 1.7–2.4)

## 2020-08-15 LAB — PHOSPHORUS: Phosphorus: 2.9 mg/dL (ref 2.5–4.6)

## 2020-08-15 NOTE — Progress Notes (Signed)
PROGRESS NOTE    [name]  KKX:[id number] DOB: March 30, 1946 DOA: 08/09/2020 PCP: [name], MD   Brief Narrative:  HPI per Dr. Adella Hare on 08/09/20 [name] is a 74 y.o. male with medical history significant of CAD s/p CABG, COPD,  HFpEF, Cirrhosis of the liver with known varices, h/o GI bleeding, hepatocellular carcinoma developed acute SOB/DOE at home. He also had increased peripheral edema. He reports having black stools. Due to his acute symptoms he presents to [facility] for evaluation.   ED Course: T 97.9  131/47  HR 74  RR 16. ED exam revealed patient to have SOB, wheezing and rales on lung exam,  anasarca to lover abdomen. Lab revelaed Cr 1.23, BNP 326.3, Trop #1 13, #2 13, Hgb 5.5 down from baseline of approximately 8. CXR reveals increased pulmonary edema. In the ED he was given IV lasix 40 mg, he was to be crossmatched and transfused 2 uPRBCs, he was to be started on IV PPI with bolus and infusion. GI consulted who agreed to see patient 08/10/20. [facility] called to admit patient for continued management of GI bleed and acute on Chronic HFpEF.  **Interim History Gastroenterology evaluated and the patient to an EGD and that showed severe portal hypertensive gastropathy in the stomach with diffuse friable mucosa with contact bleeding.  There is also noted to have grade 1 esophageal varices and type I isolated gastric varices with no bleeding or stigmata of recent bleeding.  Patient remained on octreotide drip and was also on a Protonix drip and his octreotide drip stopped on 08/13/2020 and his Protonix drip was changed to IV Protonix 40 mg daily by gastroenterology.  Patient remains significantly volume overloaded so cardiology was consulted for further assistance with diuresis and because it was felt the patient had acute on chronic diastolic CHF exacerbation.  Cardiology has now doubled the patient's diuresis and increase this to 80 twice daily  Patient continues to diurese  well and his weight is improving but still not back to dry weight so cardiology recommending continuing the current dose of IV Lasix of 80 mg every 12h.  PT OT recommending home health when he is medically stable for discharge.  08/15/2020: Continues remain volume overloaded so cardiology recommending continuing diuresis with IV Lasix 80 mg twice daily today.  Recommending continue to ambulate the patient and discharge once he is back to his dry weight and improved from a cardiac standpoint.  Assessment & Plan:   Active Problems:   Type 2 diabetes with complication (HCC)   OSA (obstructive sleep apnea)   Hyperlipidemia with target LDL less than 70   GI bleed   CKD (chronic kidney disease), stage III (HCC)   (HFpEF) heart failure with preserved ejection fraction (HCC)   Hepatocellular carcinoma (HCC)  Acute GI Bleed in the setting of Portal Gastropathy History of Gastric Varices -Patient was FOBT positive, with melanotic stools -Hemoglobin dropped to 5.5 on presentation, baseline around 8; Now Hgb/Hct has been relatively stable last few days this morning was 7.6/27.0 -Last EGD in August/21 showed gastric varices, nonbleeding -GI consulted, EGD on 08/10/2020 showed severe portal hypertensive gastropathy in the stomach with diffuse friable mucosa with contact bleeding, Grade 1 esophageal varices and type I isolated gastric varices was noted with no bleeding or stigmata of recent bleeding -Patient has not had any recurrent bleeding and hemoglobin has now dropped to 7.6 as above -GI recommended stopping the octreotide drip after 72 hours and this was done on  08/13/2020 and his Protonix drip was changed to IV Protonix and now changed to p.o. -Diet has been advanced to a low sodium diet and recommending the patient be discharged in the next 1 to 2 days if stable -Patient to be followed by Dr. Michail Sermon in the outpatient setting in 4 weeks -Patient is stable from a GI perspective but given his heart  failure we will have cardiology evaluate prior to safe discharge disposition given that he is extremely volume overloaded -Continue Monitor closely for signs and symptoms of bleeding  Acute on chronic iron deficiency blood loss anemia -Likely 2/2 above -Hemoglobin dropped to 5.5 on presentation, baseline around 8; Now Hgb/Hct is 7.6/27.0 -S/p 3 units of PRBC on 08/10/2020, another 1 unit of PRBC on 08/11/2020 -Continue to monitor for signs and symptoms of bleeding; currently no overt bleeding noted Repeat CBC in a.m.  Decompensated alcoholic liver cirrhosis Hyperbilirubinemia -Noted to have an anasarca, oriented -LFTs WNL, T bili 1.4 -> 1.7 -> 1.6 -> 1.3 -> 0.9 today, will continue to Trend  -RUQ Ultrasound, unchanged, no ascites -IV Lasix was changed to 80 mg every 12h as we are holding home Torsemide, Aldactone for now and will resume once he is more diuresed; Cardiology consulted for his volume overload  Thrombocytopenia -Patient's Platelet Count went from 126 -> 124 -> 100 -> 111 -> 115 -Continue to Monitor for S/Sx of Bleeding -Repeat CBC in AM   Acute on Chronic Grade 3 Diastolic HF -Noted Anasarca -BNP 326 and then trended up to 414.2 -Chest x-ray showed increased pulmonary edema; repeat chest x-ray in a.m. -Troponin WNL, EKG with no acute ST changes -Last echo done on 04/2020 showed EF of 60 to 65% with grade 3 diastolic dysfunction -Continue IV Lasix 40 mg q12h, hold home losartan for now -Cardiology consulted on 08/11/2020 but consult not put in so will do it now to have them assist with diuresis -Strict I's and O's, daily weights; Patient is Now -3.353 liters; weight is up +15 lbs and repeat not done today  -We will advance his diet to low-sodium diet with 2 g sodium restriction and restriction to 1500 mL as well -Cardiology consulted for further assistance and recommendations and Dr. Nechama Guard has increased his IV Lasix to 80 mg twice daily and that he continues to have  lung crackles as well as 3+ lower extremity edema -Repeat chest x-ray 08/14/20 AM shows "Cardiac shadow remains enlarged. Aortic calcifications are again seen. Persistent vascular congestion is noted but significantly improved when compared with the prior exam. Mild edema remains as well. Small left pleural effusion is seen. No focal infiltrate is noted." -Repeat CXR this AM showed "Cardiomegaly and mild persistent edema" -Continue to Monitor for S/Sx of Volume Overload and repeat CXR in the AM    Hypokalemia -Patient's K+ was 3.3 and repeat is now 3.8 -Continue to Monitor and Replete as Necessary -Repeat CMP in the AM   History of Asthma -Currently not in exacerbation X-continue with DuoNeb 3 mils twice daily, as well as albuterol neb 2.5 mg every 2 hours as needed for shortness of breath -Supplemental oxygen via nasal cannula -SpO2: 98 % O2 Flow Rate (L/min): 1 L/min FiO2 (%): 21 % -Patient will need an ambulatory home O2 screen prior to discharge; he was off of supplemental oxygen via nasal cannula  CAD -Involving the native coronary artery of the heart without angina pectoris-he has severe left main disease on his cath in 2015 and underwent CABG x3 -Continue with  nitroglycerin 0.4 mg sublingual every 5 minutes as needed chest pain, atorvastatin 40 once p.o. daily -Currently not on a beta-blocker due to his baseline bradycardia and not on aspirin due to chronic anemia and history of multiple GI bleeds -We will consult cardiology for further assistance but is losartan has been held for now -Dr. Gardiner Rhyme recommends continuing the patient's statin as above  GERD -C/w PPI and has now changed to po   CKD Stage II  to p.o.-Creatinine stable at baseline -Continue to Monitor as Patient is getting diuresis -BUN/Cr now stable and improved to 25/1.21 -Avoid Nephrotoxic Medications, Contrast Dyes, Avoid Hypotension and Renally adjust Medications -Continue to Monitor and Trend -Repeat  CMP  Pancytopenia -Patient's WBC was 3.8, hemoglobin/hematocrit was 7.5/26.4, and platelet count was 111 -Now WBC is 3.5, Hgb/Hct is 7.6/27.0, and Platelet Count is 115 -In the setting of his liver cirrhosis as above -Continue monitor and trend and continue to monitor for signs and symptoms of bleeding -Repeat CBC in a.m.  Diabetes Mellitus Type 2 -Last A1c 5.3% on 08/10/20 -Bolus to keep his hemoglobin A1c less than 7 -Continue SSI, Accu-Cheks, Lantus, hypoglycemic protocol -Hold Home Regimen -CBGs have been ranging from 104-235  Hypertension -BP was soft the day before yesterday yesterday  -His Losartan had been held; [place] can resume in the AM and cardiology anticipating resuming prior to discharge -Continue to Monitor BP per Protocol -Last BP was 128/54  Hepatocellular Carcinoma -Patient currently follows with IR; patient had chemoembolization in November -Continue outpatient follow-up  HLD -C/w Atorvastatin 40 mg po Daily   OSA -Noncompliant with CPAP  Obesity -Complicates overall care and prognosis -Estimated body mass index is 38.06 kg/m as calculated from the following:   Height as of this encounter: 5\' 8"  (1.727 m).   Weight as of this encounter: 113.5 kg. -Weight Loss and Dietary Counseling given  Lifestyle modification advised  [place] -Patient with multiple comorbidities, overall poor prognosis -Palliative consulted-initially agreed to be DNR, but changed his mind and wants to be full code  DVT prophylaxis: SCDs Code Status: FULL CODE  Family Communication: No family present at bedside  Disposition Plan: Pending further clinical improvement and diuresis back to dry weight as gastroenterology has signed off the patient now.  Patient remains significantly volume overloaded but will go home with home health once cleared by cardiology and transition back to p.o. diuretics  Status is: Inpatient  Remains inpatient appropriate because:Unsafe d/c plan, IV  treatments appropriate due to intensity of illness or inability to take PO and Inpatient level of care appropriate due to severity of illness   Dispo: The patient is from: Home              Anticipated d/c is to: Home              Anticipated d/c date is: 2 days              Patient currently is not medically stable to d/c.  Consultants:   Gastroenterology  Palliative Care Medicine  Cardiology    Procedures: EGD Findings:      Grade I varices were found in the lower third of the esophagus. No       stigmata of bleeding.      The Z-line was regular and was found 45 cm from the incisors.      Severe portal hypertensive gastropathy was found in the cardia, in the       gastric fundus and in the gastric  body.      Diffuse friable mucosa with contact bleeding was found in the cardia, in       the gastric fundus and in the gastric body.      Type 1 isolated gastric varices (IGV1, varices located in the fundus)       with no bleeding were found in the gastric fundus. There were no       stigmata of recent bleeding. They were medium in largest diameter.      The examined duodenum was normal. Impression:               - Grade I esophageal varices.                           - Z-line regular, 45 cm from the incisors.                           - Portal hypertensive gastropathy.                           - Friable gastric mucosa.                           - Type 1 isolated gastric varices (IGV1, varices                            located in the fundus), without bleeding.                           - Normal examined duodenum.                           - No specimens collected. Recommendation:           - Clear liquid diet.                           - Observe patient's clinical course.                           - Continue Octreotide and Protonix drips.  Antimicrobials:  Anti-infectives (From admission, onward)   None        Subjective: Seen and examined at bedside and states that  his legs are still puffy but he thinks his shortness of breath is better.  Denies any nausea, vomiting, abdominal pain.  Has not had any more bleeding noted.  Denies any chest pain, lightheadedness or dizziness.  Anxious to go home as he states that he needs to do his Christmas shopping.  Objective: Vitals:   08/14/20 2100 08/15/20 0738 08/15/20 0759 08/15/20 1144  BP: (!) 122/55  (!) 126/55 (!) 128/54  Pulse: 67  71 73  Resp: 17  19 18   Temp: 98.9 F (37.2 C)  98.5 F (36.9 C) 98.1 F (36.7 C)  TempSrc: Oral  Oral Oral  SpO2: 97% 97% 98% 98%  Weight:      Height:        Intake/Output Summary (Last 24 hours) at 08/15/2020 1205 Last data filed at 08/14/2020 1940 Gross per 24 hour  Intake 120 ml  Output --  Net 120 ml   [employer]  08/11/20 0509 08/13/20 1300 08/14/20 0700  Weight: 114.4 kg 115.8 kg 113.5 kg   Examination: Physical Exam:  Constitutional: WN/WD obese Caucasian male who is chronically ill-appearing in NAD and appears calm and comfortable Eyes: Lids and conjunctivae normal, sclerae anicteric  ENMT: External Ears, Nose appear normal. Grossly normal hearing. Neck: Appears normal, supple, no cervical masses, normal ROM, no appreciable thyromegaly; continues to have slight JVD Respiratory: Managed to auscultation bilaterally with coarse breath sounds and some slight crackles noted but no wheezing, rales, rhonchi. Normal respiratory effort and patient is not tachypenic. No accessory muscle use.  Unlabored breathing is not wearing supplemental oxygen via nasal cannula Cardiovascular: RRR, no murmurs / rubs / gallops. S1 and S2 auscultated.  Has 2+ lower extremity pitting edema Abdomen: Soft, non-tender, non-distended.  Bowel sounds positive.  GU: Deferred. Musculoskeletal: No clubbing / cyanosis of digits/nails. No joint deformity upper and lower extremities.  Skin: No rashes, lesions, ulcers on limited skin evaluation. No induration; Warm and dry.  Neurologic: CN  2-12 grossly intact with no focal deficits. Romberg sign and cerebellar reflexes not assessed.  Psychiatric: Normal judgment and insight. Alert and oriented x 3. Normal mood and appropriate affect.   Data Reviewed: I have personally reviewed following labs and imaging studies  CBC: Recent Labs  Lab 08/11/20 0306 08/11/20 1521 08/12/20 0150 08/13/20 0047 08/14/20 0350 08/15/20 0236  WBC 4.3  --  4.9 4.5 3.8* 3.5*  NEUTROABS 3.4  --  4.0 3.7 2.9 2.6  HGB 7.0* 8.2* 7.6* 8.2* 7.5* 7.6*  HCT 25.0* 29.2* 25.5* 28.9* 26.4* 27.0*  MCV 82.0  --  80.4 81.9 81.0 81.3  PLT 126*  --  124* 100* 111* 950*   Basic Metabolic Panel: Recent Labs  Lab 08/11/20 0306 08/12/20 0150 08/13/20 0047 08/14/20 0350 08/15/20 0236  NA 139 138 137 135 137  K 3.3* 3.8 4.0 3.7 3.8  CL 103 102 102 100 101  CO2 26 26 26 26 28   GLUCOSE 113* 107* 134* 122* 146*  BUN 21 21 20 19  25*  CREATININE 1.25* 1.30* 1.36* 1.30* 1.21  CALCIUM 8.3* 8.2* 8.2* 8.1* 8.4*  MG  --   --  2.0 2.0 2.0  PHOS  --   --  3.3 3.2 2.9   GFR: Estimated Creatinine Clearance: 65.5 mL/min (by C-G formula based on SCr of 1.21 mg/dL). Liver Function Tests: Recent Labs  Lab 08/11/20 0306 08/12/20 0150 08/13/20 0047 08/14/20 0350 08/15/20 0236  AST 23 22 20 18 20   ALT 16 16 15 14 15   ALKPHOS 106 100 98 93 107  BILITOT 1.5* 1.7* 1.6* 1.3* 0.9  PROT 6.3* 6.2* 6.5 6.0* 6.4*  ALBUMIN 3.2* 3.3* 3.3* 3.1* 3.2*   No results for input(s): LIPASE, AMYLASE in the last 168 hours. No results for input(s): AMMONIA in the last 168 hours. Coagulation Profile: Recent Labs  Lab 08/10/20 1525  INR 1.2   Cardiac Enzymes: No results for input(s): CKTOTAL, CKMB, CKMBINDEX, TROPONINI in the last 168 hours. BNP (last 3 results) No results for input(s): PROBNP in the last 8760 hours. HbA1C: No results for input(s): HGBA1C in the last 72 hours. CBG: Recent Labs  Lab 08/14/20 1159 08/14/20 1616 08/14/20 2222 08/15/20 0757 08/15/20 1143   GLUCAP 235* 104* 174* 121* 205*   Lipid Profile: No results for input(s): CHOL, HDL, LDLCALC, TRIG, CHOLHDL, LDLDIRECT in the last 72 hours. Thyroid Function Tests: No results for input(s): TSH, T4TOTAL, FREET4, T3FREE, THYROIDAB in the last 72 hours.  Anemia Panel: No results for input(s): VITAMINB12, FOLATE, FERRITIN, TIBC, IRON, RETICCTPCT in the last 72 hours. Sepsis Labs: No results for input(s): PROCALCITON, LATICACIDVEN in the last 168 hours.  Recent Results (from the past 240 hour(s))  Resp Panel by RT-PCR (Flu A&B, Covid) Nasopharyngeal Swab     Status: None   Collection Time: 08/09/20  7:47 PM   Specimen: Nasopharyngeal Swab; Nasopharyngeal(NP) swabs in vial transport medium  Result Value Ref Range Status   SARS Coronavirus 2 by RT PCR NEGATIVE NEGATIVE Final    Comment: (NOTE) SARS-CoV-2 target nucleic acids are NOT DETECTED.  The SARS-CoV-2 RNA is generally detectable in upper respiratory specimens during the acute phase of infection. The lowest concentration of SARS-CoV-2 viral copies this assay can detect is 138 copies/mL. A negative result does not preclude SARS-Cov-2 infection and should not be used as the sole basis for treatment or other patient management decisions. A negative result may occur with  improper specimen collection/handling, submission of specimen other than nasopharyngeal swab, presence of viral mutation(s) within the areas targeted by this assay, and inadequate number of viral copies(<138 copies/mL). A negative result must be combined with clinical observations, patient history, and epidemiological information. The expected result is Negative.  Fact Sheet for Patients:  EntrepreneurPulse.com.au  Fact Sheet for Healthcare Providers:  IncredibleEmployment.be  This test is no t yet approved or cleared by the [place] FDA and  has been authorized for detection and/or diagnosis of SARS-CoV-2 by FDA under an  Emergency Use Authorization (EUA). This EUA will remain  in effect (meaning this test can be used) for the duration of the COVID-19 declaration under Section 564(b)(1) of the Act, 21 U.S.C.section 360bbb-3(b)(1), unless the authorization is terminated  or revoked sooner.       Influenza A by PCR NEGATIVE NEGATIVE Final   Influenza B by PCR NEGATIVE NEGATIVE Final    Comment: (NOTE) The Xpert Xpress SARS-CoV-2/FLU/RSV plus assay is intended as an aid in the diagnosis of influenza from Nasopharyngeal swab specimens and should not be used as a sole basis for treatment. Nasal washings and aspirates are unacceptable for Xpert Xpress SARS-CoV-2/FLU/RSV testing.  Fact Sheet for Patients: EntrepreneurPulse.com.au  Fact Sheet for Healthcare Providers: IncredibleEmployment.be  This test is not yet approved or cleared by the [place] FDA and has been authorized for detection and/or diagnosis of SARS-CoV-2 by FDA under an Emergency Use Authorization (EUA). This EUA will remain in effect (meaning this test can be used) for the duration of the COVID-19 declaration under Section 564(b)(1) of the Act, 21 U.S.C. section 360bbb-3(b)(1), unless the authorization is terminated or revoked.  Performed at [facility], [place] [address], [place], [place] [address]      RN Pressure Injury Documentation:     Estimated body mass index is 38.06 kg/m as calculated from the following:   Height as of this encounter: 5\' 8"  (1.727 m).   Weight as of this encounter: 113.5 kg.  Malnutrition Type:   Malnutrition Characteristics:   Nutrition Interventions:     Radiology Studies: DG CHEST PORT 1 VIEW  Result Date: 08/15/2020 CLINICAL DATA:  Shortness of breath. Vascular congestion. Mild edema. EXAM: PORTABLE CHEST 1 VIEW COMPARISON:  August 14, 2020 FINDINGS: Cardiomegaly.  Mild edema persists.  No other changes. IMPRESSION: Cardiomegaly and mild  persistent edema. Electronically Signed   By: Dorise Bullion III M.D   On: 08/15/2020 09:53   DG CHEST PORT 1 VIEW  Result Date: 08/14/2020 CLINICAL DATA:  Dyspnea EXAM: PORTABLE CHEST 1 VIEW COMPARISON:  08/09/2020 FINDINGS: Cardiac shadow remains enlarged. Aortic calcifications are again seen. Persistent vascular congestion is noted but significantly improved when compared with the prior exam. Mild edema remains as well. Small left pleural effusion is seen. No focal infiltrate is noted. IMPRESSION: Improved CHF when compare with the prior exam. Electronically Signed   By: Inez Catalina M.D.   On: 08/14/2020 08:23   Scheduled Meds: . sodium chloride   Intravenous Once  . atorvastatin  40 mg Oral Daily  . furosemide  80 mg Intravenous BID  . gabapentin  100 mg Oral BID  . insulin aspart  0-20 Units Subcutaneous TID WC  . insulin glargine  10 Units Subcutaneous QHS  . ipratropium-albuterol  3 mL Nebulization BID  . pantoprazole  40 mg Oral QHS   Continuous Infusions: . sodium chloride Stopped (08/10/20 0249)    LOS: 6 days   Kerney Elbe, DO [facility] PAGER is on AMION  If 7PM-7AM, please contact night-coverage [email or web address]

## 2020-08-15 NOTE — Progress Notes (Signed)
Progress Note  Patient Name: [name] Date of Encounter: 08/15/2020  [facility] Cardiologist: Mertie Moores, MD   Subjective   NAEO. Tells me that his swelling is improving. Hoping to discharge soon. No weights documented. No I/O documented.  Inpatient Medications    Scheduled Meds: . sodium chloride   Intravenous Once  . atorvastatin  40 mg Oral Daily  . furosemide  80 mg Intravenous BID  . gabapentin  100 mg Oral BID  . insulin aspart  0-20 Units Subcutaneous TID WC  . insulin glargine  10 Units Subcutaneous QHS  . ipratropium-albuterol  3 mL Nebulization BID  . pantoprazole  40 mg Oral QHS   Continuous Infusions: . sodium chloride Stopped (08/10/20 0249)   PRN Meds: acetaminophen **OR** acetaminophen, albuterol, morphine injection, nitroGLYCERIN, senna, sodium chloride   Vital Signs    Vitals:   08/14/20 2037 08/14/20 2100 08/15/20 0738 08/15/20 0759  BP:  (!) 122/55  (!) 126/55  Pulse:  67  71  Resp:  17  19  Temp:  98.9 F (37.2 C)  98.5 F (36.9 C)  TempSrc:  Oral  Oral  SpO2: 98% 97% 97% 98%  Weight:      Height:        Intake/Output Summary (Last 24 hours) at 08/15/2020 1018 Last data filed at 08/14/2020 1940 Gross per 24 hour  Intake 1666.23 ml  Output 1200 ml  Net 466.23 ml   Last 3 Weights 08/14/2020 08/13/2020 08/11/2020  Weight (lbs) 250 lb 4.8 oz 255 lb 3.2 oz 252 lb 3.3 oz  Weight (kg) 113.535 kg 115.758 kg 114.4 kg      Telemetry    Sinus with PVCs - Personally Reviewed  ECG    No new - Personally Reviewed  Physical Exam   GEN: No acute distress.   Neck: No JVD Cardiac: RRR, no murmurs, rubs, or gallops.  Respiratory: Clear to auscultation bilaterally. GI: Soft, nontender, non-distended  MS: 2-3+ pitting edema in bilateral LEs Neuro:  Nonfocal  Psych: Normal affect   Labs    High Sensitivity Troponin:   Recent Labs  Lab 08/09/20 1733 08/09/20 1930  TROPONINIHS 13 13      Chemistry Recent Labs  Lab  08/13/20 0047 08/14/20 0350 08/15/20 0236  NA 137 135 137  K 4.0 3.7 3.8  CL 102 100 101  CO2 26 26 28   GLUCOSE 134* 122* 146*  BUN 20 19 25*  CREATININE 1.36* 1.30* 1.21  CALCIUM 8.2* 8.1* 8.4*  PROT 6.5 6.0* 6.4*  ALBUMIN 3.3* 3.1* 3.2*  AST 20 18 20   ALT 15 14 15   ALKPHOS 98 93 107  BILITOT 1.6* 1.3* 0.9  GFRNONAA 55* 58* >60  ANIONGAP 9 9 8      Hematology Recent Labs  Lab 08/13/20 0047 08/14/20 0350 08/15/20 0236  WBC 4.5 3.8* 3.5*  RBC 3.53* 3.26* 3.32*  HGB 8.2* 7.5* 7.6*  HCT 28.9* 26.4* 27.0*  MCV 81.9 81.0 81.3  MCH 23.2* 23.0* 22.9*  MCHC 28.4* 28.4* 28.1*  RDW 19.3* 19.4* 19.6*  PLT 100* 111* 115*    BNP Recent Labs  Lab 08/09/20 1926 08/14/20 0353  BNP 326.3* 414.2*     DDimer No results for input(s): DDIMER in the last 168 hours.   Radiology    DG CHEST PORT 1 VIEW  Result Date: 08/15/2020 CLINICAL DATA:  Shortness of breath. Vascular congestion. Mild edema. EXAM: PORTABLE CHEST 1 VIEW COMPARISON:  August 14, 2020 FINDINGS: Cardiomegaly.  Mild edema persists.  No other changes. IMPRESSION: Cardiomegaly and mild persistent edema. Electronically Signed   By: Dorise Bullion III M.D   On: 08/15/2020 09:53   DG CHEST PORT 1 VIEW  Result Date: 08/14/2020 CLINICAL DATA:  Dyspnea EXAM: PORTABLE CHEST 1 VIEW COMPARISON:  08/09/2020 FINDINGS: Cardiac shadow remains enlarged. Aortic calcifications are again seen. Persistent vascular congestion is noted but significantly improved when compared with the prior exam. Mild edema remains as well. Small left pleural effusion is seen. No focal infiltrate is noted. IMPRESSION: Improved CHF when compare with the prior exam. Electronically Signed   By: Inez Catalina M.D.   On: 08/14/2020 08:23    Cardiac Studies   No new  Assessment & Plan   74yo man with chronic diastolic HF, CAD s/p CABG, HTN, hLD, DM, OSA, cirrhosis, HCC, CKD3 who is admitted with severe volume overload.   1. Acute on chronic diastolic  hF - please obtain daily weights and record I/Os - K>4, Mg>2 - continue Lasix 80mg  IV BID  2. CAD Cont statin  3. HTN  4. HLD    For questions or updates, please contact [place] Please consult [email or web address] for contact info under        Signed, Vickie Epley, MD  08/15/2020, 10:18 AM

## 2020-08-16 ENCOUNTER — Inpatient Hospital Stay (HOSPITAL_COMMUNITY): Payer: Medicare HMO

## 2020-08-16 LAB — CBC WITH DIFFERENTIAL/PLATELET
Abs Immature Granulocytes: 0.01 10*3/uL (ref 0.00–0.07)
Basophils Absolute: 0 10*3/uL (ref 0.0–0.1)
Basophils Relative: 0 %
Eosinophils Absolute: 0.1 10*3/uL (ref 0.0–0.5)
Eosinophils Relative: 3 %
HCT: 25.7 % — ABNORMAL LOW (ref 39.0–52.0)
Hemoglobin: 7.3 g/dL — ABNORMAL LOW (ref 13.0–17.0)
Immature Granulocytes: 0 %
Lymphocytes Relative: 12 %
Lymphs Abs: 0.4 10*3/uL — ABNORMAL LOW (ref 0.7–4.0)
MCH: 23 pg — ABNORMAL LOW (ref 26.0–34.0)
MCHC: 28.4 g/dL — ABNORMAL LOW (ref 30.0–36.0)
MCV: 80.8 fL (ref 80.0–100.0)
Monocytes Absolute: 0.2 10*3/uL (ref 0.1–1.0)
Monocytes Relative: 8 %
Neutro Abs: 2.2 10*3/uL (ref 1.7–7.7)
Neutrophils Relative %: 77 %
Platelets: 104 10*3/uL — ABNORMAL LOW (ref 150–400)
RBC: 3.18 MIL/uL — ABNORMAL LOW (ref 4.22–5.81)
RDW: 19.2 % — ABNORMAL HIGH (ref 11.5–15.5)
WBC: 2.9 10*3/uL — ABNORMAL LOW (ref 4.0–10.5)
nRBC: 0 % (ref 0.0–0.2)

## 2020-08-16 LAB — GLUCOSE, CAPILLARY
Glucose-Capillary: 91 mg/dL (ref 70–99)
Glucose-Capillary: 194 mg/dL — ABNORMAL HIGH (ref 70–99)
Glucose-Capillary: 148 mg/dL — ABNORMAL HIGH (ref 70–99)
Glucose-Capillary: 127 mg/dL — ABNORMAL HIGH (ref 70–99)

## 2020-08-16 LAB — COMPREHENSIVE METABOLIC PANEL
ALT: 15 U/L (ref 0–44)
AST: 18 U/L (ref 15–41)
Albumin: 3 g/dL — ABNORMAL LOW (ref 3.5–5.0)
Alkaline Phosphatase: 94 U/L (ref 38–126)
Anion gap: 10 (ref 5–15)
BUN: 24 mg/dL — ABNORMAL HIGH (ref 8–23)
CO2: 29 mmol/L (ref 22–32)
Calcium: 8.4 mg/dL — ABNORMAL LOW (ref 8.9–10.3)
Chloride: 100 mmol/L (ref 98–111)
Creatinine, Ser: 1.2 mg/dL (ref 0.61–1.24)
GFR, Estimated: >60 mL/min (ref >60)
Glucose, Bld: 160 mg/dL — ABNORMAL HIGH (ref 70–99)
Potassium: 3.9 mmol/L (ref 3.5–5.1)
Sodium: 139 mmol/L (ref 135–145)
Total Bilirubin: 0.7 mg/dL (ref 0.3–1.2)
Total Protein: 6 g/dL — ABNORMAL LOW (ref 6.5–8.1)

## 2020-08-16 LAB — MAGNESIUM: Magnesium: 2 mg/dL (ref 1.7–2.4)

## 2020-08-16 LAB — PHOSPHORUS: Phosphorus: 3.1 mg/dL (ref 2.5–4.6)

## 2020-08-16 NOTE — Progress Notes (Signed)
Occupational Therapy Treatment Patient Details Name: [name] MRN: [id number] DOB: 06/16/1946 Today's Date: 08/16/2020    History of present illness 74 yo male with onset of anasarca and pleural effusion was admitted for symptoms of GI bleed and had EGD on 12.13.  He is anemic at 5.5 initially, SOB and had epigastric pain.  EGD located gastric nonbleeding varices with portal hypertensive gastropathy.  PMHx:  liver mass, CHF, CAD, cirrhosis, transfusions, OSA, anemia, ankle fracture ORIF   OT comments  Patient continues to make steady progress towards goals in skilled OT session. Patient's session encompassed ambulation of household distances, grooming standing at the sink, and toileting. Pt at supervision level for ADLs, and min guard for safety with transfers and ambulation. Oxygen stable throughout on room air. Discharge remains appropriate, therapy will continue to follow.    Follow Up Recommendations  Home health OT;Supervision/Assistance - 24 hour    Equipment Recommendations  3 in 1 bedside commode    Recommendations for Other Services      Precautions / Restrictions Precautions Precautions: Fall Precaution Comments: monitor O2 sats Restrictions Weight Bearing Restrictions: No Other Position/Activity Restrictions: BLE edema; h/o painful L knee       Mobility Bed Mobility               General bed mobility comments: sitting EOB upon arrival and at end of session  Transfers Overall transfer level: Needs assistance Equipment used: Rolling walker (2 wheeled) Transfers: Sit to/from Stand Sit to Stand: Min guard         General transfer comment: Min gaurd for safety    Balance Overall balance assessment: Needs assistance Sitting-balance support: Feet supported;No upper extremity supported Sitting balance-Leahy Scale: Fair Sitting balance - Comments: able to static sit EOB to eat with no LOB   Standing balance support: Bilateral upper extremity  supported Standing balance-Leahy Scale: Poor Standing balance comment: reliant on bil UE support from RW                           ADL either performed or assessed with clinical judgement   ADL Overall ADL's : Needs assistance/impaired     Grooming: Wash/dry hands;Wash/dry face;Set up;Standing Grooming Details (indicate cue type and reason): standing at sink                 Toilet Transfer: Supervision/safety;RW;Ambulation;Regular Museum/gallery exhibitions officer and Hygiene: Supervision/safety;Sit to/from stand;Sitting/lateral lean       Functional mobility during ADLs: Min guard;Rolling walker General ADL Comments: pt able to complete in room functional mobility with RW and min gaurd for safey, able to complete toileting with supervision, VSS throughout     Vision       Perception     Praxis      Cognition Arousal/Alertness: Awake/alert Behavior During Therapy: WFL for tasks assessed/performed Overall Cognitive Status: Within Functional Limits for tasks assessed                                          Exercises     Shoulder Instructions       General Comments      Pertinent Vitals/ Pain       Pain Assessment: No/denies pain  Home Living  Prior Functioning/Environment              Frequency  Min 2X/week        Progress Toward Goals  OT Goals(current goals can now be found in the care plan section)  Progress towards OT goals: Progressing toward goals  Acute Rehab OT Goals Patient Stated Goal: to go home OT Goal Formulation: With patient Time For Goal Achievement: 08/26/20 Potential to Achieve Goals: Good  Plan Discharge plan remains appropriate;Frequency remains appropriate    Co-evaluation                 AM-PAC OT "6 Clicks" Daily Activity     Outcome Measure   Help from another person eating meals?: None Help from another  person taking care of personal grooming?: None Help from another person toileting, which includes using toliet, bedpan, or urinal?: None Help from another person bathing (including washing, rinsing, drying)?: A Little Help from another person to put on and taking off regular upper body clothing?: None Help from another person to put on and taking off regular lower body clothing?: A Little 6 Click Score: 22    End of Session Equipment Utilized During Treatment: Gait belt;Rolling walker  OT Visit Diagnosis: Unsteadiness on feet (R26.81);Muscle weakness (generalized) (M62.81);Pain Pain - Right/Left: Left Pain - part of body: Knee   Activity Tolerance Patient tolerated treatment well   Patient Left in bed;with call bell/phone within reach (pt sitting EOB)   Nurse Communication Mobility status        Time: 8675-4492 OT Time Calculation (min): 14 min  Charges: OT General Charges $OT Visit: 1 Visit OT Treatments $Self Care/Home Management : 8-22 mins  Corinne Ports E. [place], [facility] [place] 08/16/2020, 10:57 AM

## 2020-08-16 NOTE — Progress Notes (Addendum)
Progress Note  Patient Name: [name] Date of Encounter: 08/16/2020  [facility] Cardiologist: Mertie Moores, MD   Subjective  Feels well. Hoping to go home today. Remains grossly volume up.  Diuresing well. Net negative 2.7 L. Wt from 250lbs on 12/17 to 243lbs today. Cr stable at 1.2  Inpatient Medications    Scheduled Meds: . sodium chloride   Intravenous Once  . atorvastatin  40 mg Oral Daily  . furosemide  80 mg Intravenous BID  . gabapentin  100 mg Oral BID  . insulin aspart  0-20 Units Subcutaneous TID WC  . insulin glargine  10 Units Subcutaneous QHS  . ipratropium-albuterol  3 mL Nebulization BID  . pantoprazole  40 mg Oral QHS   Continuous Infusions: . sodium chloride Stopped (08/10/20 0249)   PRN Meds: acetaminophen **OR** acetaminophen, albuterol, morphine injection, nitroGLYCERIN, senna, sodium chloride   Vital Signs    Vitals:   08/16/20 0210 08/16/20 0533 08/16/20 0825 08/16/20 0923  BP: (!) 116/54 (!) 137/58 (!) 128/50   Pulse: 60 70 74   Resp: 20 20 20    Temp: 98.4 F (36.9 C) 98.6 F (37 C) 98.8 F (37.1 C)   TempSrc: Oral Oral Oral   SpO2: 97% 92% 98% 97%  Weight:  110.6 kg    Height:        Intake/Output Summary (Last 24 hours) at 08/16/2020 1046 Last data filed at 08/16/2020 3664 Gross per 24 hour  Intake 474 ml  Output 3200 ml  Net -2726 ml   Last 3 Weights 08/16/2020 08/14/2020 08/13/2020  Weight (lbs) 243 lb 12.8 oz 250 lb 4.8 oz 255 lb 3.2 oz  Weight (kg) 110.587 kg 113.535 kg 115.758 kg      Telemetry    NSR with freqeunt PVCs - Personally Reviewed  ECG    No new tracing - Personally Reviewed  Physical Exam   GEN: No acute distress.   Neck: JVD difficult to assess due to body habitus Cardiac: RRR, no murmurs, rubs, or gallops.  Respiratory: Clear to auscultation bilaterally. GI: Soft, nontender, non-distended  MS: 2-3+ tense pitting edema Neuro:  Nonfocal  Psych: Normal affect   Labs    High  Sensitivity Troponin:   Recent Labs  Lab 08/09/20 1733 08/09/20 1930  TROPONINIHS 13 13      Chemistry Recent Labs  Lab 08/14/20 0350 08/15/20 0236 08/16/20 0049  NA 135 137 139  K 3.7 3.8 3.9  CL 100 101 100  CO2 26 28 29   GLUCOSE 122* 146* 160*  BUN 19 25* 24*  CREATININE 1.30* 1.21 1.20  CALCIUM 8.1* 8.4* 8.4*  PROT 6.0* 6.4* 6.0*  ALBUMIN 3.1* 3.2* 3.0*  AST 18 20 18   ALT 14 15 15   ALKPHOS 93 107 94  BILITOT 1.3* 0.9 0.7  GFRNONAA 58* >60 >60  ANIONGAP 9 8 10      Hematology Recent Labs  Lab 08/14/20 0350 08/15/20 0236 08/16/20 0049  WBC 3.8* 3.5* 2.9*  RBC 3.26* 3.32* 3.18*  HGB 7.5* 7.6* 7.3*  HCT 26.4* 27.0* 25.7*  MCV 81.0 81.3 80.8  MCH 23.0* 22.9* 23.0*  MCHC 28.4* 28.1* 28.4*  RDW 19.4* 19.6* 19.2*  PLT 111* 115* 104*    BNP Recent Labs  Lab 08/09/20 1926 08/14/20 0353  BNP 326.3* 414.2*     DDimer No results for input(s): DDIMER in the last 168 hours.   Radiology    DG CHEST PORT 1 VIEW  Result Date: 08/16/2020 CLINICAL  DATA:  Shortness of breath. Congestive heart failure. Coronary artery disease. EXAM: PORTABLE CHEST 1 VIEW COMPARISON:  08/15/2020 FINDINGS: Stable cardiomegaly. Prior CABG again noted. Mild improvement in diffuse interstitial infiltrates is seen, consistent with decreased interstitial edema. No evidence of pulmonary consolidation. IMPRESSION: Mild decrease in diffuse interstitial infiltrates/edema. Stable cardiomegaly. Electronically Signed   By: Marlaine Hind M.D.   On: 08/16/2020 10:42   DG CHEST PORT 1 VIEW  Result Date: 08/15/2020 CLINICAL DATA:  Shortness of breath. Vascular congestion. Mild edema. EXAM: PORTABLE CHEST 1 VIEW COMPARISON:  August 14, 2020 FINDINGS: Cardiomegaly.  Mild edema persists.  No other changes. IMPRESSION: Cardiomegaly and mild persistent edema. Electronically Signed   By: Dorise Bullion III M.D   On: 08/15/2020 09:53    Cardiac Studies   Echo 05/27/20: 1. Left ventricular ejection  fraction, by estimation, is 60 to 65%. The  left ventricle has normal function. The left ventricle has no regional  wall motion abnormalities. Left ventricular diastolic parameters are  consistent with Grade III diastolic  dysfunction (restrictive).  2. Right ventricular systolic function is normal. The right ventricular  size is normal. There is mildly elevated pulmonary artery systolic  pressure. The estimated right ventricular systolic pressure is 20.9 mmHg.  3. Left atrial size was mildly dilated.  4. Right atrial size was mild to moderately dilated.  5. The mitral valve is normal in structure. No evidence of mitral valve  regurgitation. The mean mitral valve gradient is 3.0 mmHg with average  heart rate of 69 bpm. Moderate mitral annular calcification.  6. The aortic valve is tricuspid. There is moderate calcification of the  aortic valve. Aortic valve regurgitation is trivial. Mild aortic valve  stenosis. Aortic valve mean gradient measures 10.0 mmHg.  7. Aortic dilatation noted. There is mild dilatation of the ascending  aorta, measuring 42 mm.  8. The inferior vena cava is dilated in size with >50% respiratory  variability, suggesting right atrial pressure of 8 mmHg.    Patient Profile     74 y.o. male with a PMH of chronic diastolic CHF, CAD s/p [id number], HTN, HLD, DM type 2, OSA, cirrhosis, hepatocellular carcinoma, CKD stage 3, who presented with GI bleed with acute blood loss anemia,who is being seen today for the evaluation of CHF.  Assessment & Plan    #Acute on chronic diastolic MOQ:HUTMLYY presented with volume overload. He had pulmonary edema on CXR. BNP 414. Anasarca on exam. Remains volume up - Continue aggressive diuresis with lasix 80mg  IV BID - Continue to monitor strict I&Os and daily weights - Continue to monitor electrolytes and replete as needed to maintain K>4, Mg>2 - Low Na diet  #CAD s/p CABG:No complaints of chest pain this admission  despite acute on chronic anemia with Hgb 5.5. EKG is non-ischemic. Not on aspirin due to chronic anemia and history of multiple GI bleeds. Not on BBlocker due to baseline bradycardia -Continue statin  # HTN: Controlled. - Continue lasix as above  # HLD:LDL 93 10/2019; goal <70. LFT's appear stable, though with underlying hepatocellular carcinoma and cirrhosis.   - Continue atorvastatin  # DM type 2: A1C 5.3; goal <7 - Continue management per primary team  # OSA:not compliant with CPAP - Would benefit from regular CPAP use  # CKD stage 2-3:Cr 1.3 - Continue to monitor closely with diuresis  Remainder of care per primary team: - Acute on chronic anemia 2/2 acute blood loss anemia - Decompensated alcoholic liver cirrhosis - Thrombocytopenia - Hepatocellular  carcinoma      For questions or updates, please contact [place] Please consult [email or web address] for contact info under        Signed, Freada Bergeron, MD  08/16/2020, 10:46 AM

## 2020-08-16 NOTE — Progress Notes (Signed)
[facility] PROGRESS NOTE    [name]  WVP:[id number] DOB: 09-27-1945 DOA: 08/09/2020 PCP: [name], MD      Brief Narrative:  [name] is a 74 y.o. M with CAD s/p DES in 2013 CABG in 2015, dCHF, HTN, DM, GI bleed, cirrhosis and HCC s/p chemoembolization in Nov 2021 who presented with progressive dyspnea on exertion, edema, and melena for several days.  In the ER, hemoglobin 5.5, rales on exam, chest x-ray showed pulmonary edema.  He was started on blood transfusion, IV PPI, and IV Lasix.  GI were consulted.        Assessment & Plan:  Portal gastropathy with acute gastrointestinal hemorrhage Acute blood loss anemia Hemoglobin trending down, no further melena -Continue pantoprazole  Acute on chronic diastolic CHF Patient with edema on exam, dyspnea on exertion, lower extremity swelling, diuresed 6 L in the last 4 days, and cardiomegaly on chest x-ray. Net -2.7 L yesterday, still floridly fluid overloaded -Continue IV Lasix -Plan for discharge on torsemide 60 twice daily plus spironolactone  Decompensated alcoholic liver cirrhosis Thrombocytopenia -Consult palliative care, appreciate recommendations  Coronary disease, secondary prevention Hypertension -Continue atorvastatin  CKD stage II Creatinine stable relative to baseline  Leukopenia  Diabetes controlled, without hyperglycemia.  With polyneuropathy. Normal range -Continue Lantus -Continue aspirin -Continue gabapentin -Hold Amaryl and Metformin  Hepatocellular cancer  Sleep apnea Obesity BMI 38   Asthma No active flare -Resume Anoro at discharge        Disposition: Status is: Inpatient  Remains inpatient appropriate because:IV treatments appropriate due to intensity of illness or inability to take PO   Dispo: The patient is from: Home              Anticipated d/c is to: Home              Anticipated d/c date is: 1 day              Patient currently is  not medically stable to d/c.              MDM: The below labs and imaging reports were reviewed and summarized above.  Medication management as above.   DVT prophylaxis: SCDs Start: 08/09/20 2211  Code Status: FULL Family Communication: Son [name] by phone    Consultants:   Cardiology  Procedures:   EGD 12/13 -- GAVE          Subjective: Patient feels like his dyspnea is improving.  He still has marked lower extremity edema.  Worse than usual.  No fever, sputum, melena.  No headache or confusion.  Objective: Vitals:   08/16/20 0210 08/16/20 0533 08/16/20 0825 08/16/20 0923  BP: (!) 116/54 (!) 137/58 (!) 128/50   Pulse: 60 70 74   Resp: 20 20 20    Temp: 98.4 F (36.9 C) 98.6 F (37 C) 98.8 F (37.1 C)   TempSrc: Oral Oral Oral   SpO2: 97% 92% 98% 97%  Weight:  110.6 kg    Height:        Intake/Output Summary (Last 24 hours) at 08/16/2020 0930 Last data filed at 08/16/2020 5462 Gross per 24 hour  Intake 474 ml  Output 3200 ml  Net -2726 ml   Filed Weights   08/13/20 1300 08/14/20 0700 08/16/20 0533  Weight: 115.8 kg 113.5 kg 110.6 kg    Examination: General appearance:  adult male, alert and in no obvious distress.  On the edge of the bed.  Reactive.  HEENT: Anicteric, conjunctiva pink, lids and lashes normal. No nasal deformity, discharge, epistaxis.  Lips moist.   Skin: Warm and dry.  No jaundice.  No suspicious rashes or lesions.  Chronic venous stasis changes of the bilateral lower extremities Cardiac: RRR, nl S1-S2, no murmurs appreciated.  JVP not visible.  2+ bilateral pain LE edema.  Radial pulses 2+ and symmetric. Respiratory: Normal respiratory rate and rhythm.  No wheezing, rales at bilateral bases. Abdomen: Abdomen soft.  No TTP or guarding.  No hepatomegaly on my exam, but exam difficult.  No ascites, but difficult to appreciate given habitus.   MSK: No deformities or effusions. Neuro: Awake and alert.  EOMI, moves all  extremities. Speech fluent.    Psych: Sensorium intact and responding to questions, attention normal. Affect normal.  Judgment and insight appear normal.    Data Reviewed: I have personally reviewed following labs and imaging studies:  CBC: Recent Labs  Lab 08/12/20 0150 08/13/20 0047 08/14/20 0350 08/15/20 0236 08/16/20 0049  WBC 4.9 4.5 3.8* 3.5* 2.9*  NEUTROABS 4.0 3.7 2.9 2.6 2.2  HGB 7.6* 8.2* 7.5* 7.6* 7.3*  HCT 25.5* 28.9* 26.4* 27.0* 25.7*  MCV 80.4 81.9 81.0 81.3 80.8  PLT 124* 100* 111* 115* 923*   Basic Metabolic Panel: Recent Labs  Lab 08/12/20 0150 08/13/20 0047 08/14/20 0350 08/15/20 0236 08/16/20 0049  NA 138 137 135 137 139  K 3.8 4.0 3.7 3.8 3.9  CL 102 102 100 101 100  CO2 26 26 26 28 29   GLUCOSE 107* 134* 122* 146* 160*  BUN 21 20 19  25* 24*  CREATININE 1.30* 1.36* 1.30* 1.21 1.20  CALCIUM 8.2* 8.2* 8.1* 8.4* 8.4*  MG  --  2.0 2.0 2.0 2.0  PHOS  --  3.3 3.2 2.9 3.1   GFR: Estimated Creatinine Clearance: 65.2 mL/min (by C-G formula based on SCr of 1.2 mg/dL). Liver Function Tests: Recent Labs  Lab 08/12/20 0150 08/13/20 0047 08/14/20 0350 08/15/20 0236 08/16/20 0049  AST 22 20 18 20 18   ALT 16 15 14 15 15   ALKPHOS 100 98 93 107 94  BILITOT 1.7* 1.6* 1.3* 0.9 0.7  PROT 6.2* 6.5 6.0* 6.4* 6.0*  ALBUMIN 3.3* 3.3* 3.1* 3.2* 3.0*   No results for input(s): LIPASE, AMYLASE in the last 168 hours. No results for input(s): AMMONIA in the last 168 hours. Coagulation Profile: Recent Labs  Lab 08/10/20 1525  INR 1.2   Cardiac Enzymes: No results for input(s): CKTOTAL, CKMB, CKMBINDEX, TROPONINI in the last 168 hours. BNP (last 3 results) No results for input(s): PROBNP in the last 8760 hours. HbA1C: No results for input(s): HGBA1C in the last 72 hours. CBG: Recent Labs  Lab 08/15/20 0757 08/15/20 1143 08/15/20 1608 08/15/20 2139 08/16/20 0822  GLUCAP 121* 205* 79 178* 91   Lipid Profile: No results for input(s): CHOL, HDL,  LDLCALC, TRIG, CHOLHDL, LDLDIRECT in the last 72 hours. Thyroid Function Tests: No results for input(s): TSH, T4TOTAL, FREET4, T3FREE, THYROIDAB in the last 72 hours. Anemia Panel: No results for input(s): VITAMINB12, FOLATE, FERRITIN, TIBC, IRON, RETICCTPCT in the last 72 hours. Urine analysis:    Component Value Date/Time   COLORURINE YELLOW 05/06/2020 [place] 05/06/2020 0954   LABSPEC 1.009 05/06/2020 [place] 7.0 05/06/2020 [place] 05/06/2020 0954   HGBUR NEGATIVE 05/06/2020 [place] 07/29/2019 [place] 05/06/2020 0954   PROTEINUR NEGATIVE 05/06/2020 0954   UROBILINOGEN  0.2 03/29/2014 1546   NITRITE NEGATIVE 05/06/2020 0954   LEUKOCYTESUR NEGATIVE 05/06/2020 0954   Sepsis Labs: @LABRCNTIP (procalcitonin:4,lacticacidven:4)  ) Recent Results (from the past 240 hour(s))  Resp Panel by RT-PCR (Flu A&B, Covid) Nasopharyngeal Swab     Status: None   Collection Time: 08/09/20  7:47 PM   Specimen: Nasopharyngeal Swab; Nasopharyngeal(NP) swabs in vial transport medium  Result Value Ref Range Status   SARS Coronavirus 2 by RT PCR NEGATIVE NEGATIVE Final    Comment: (NOTE) SARS-CoV-2 target nucleic acids are NOT DETECTED.  The SARS-CoV-2 RNA is generally detectable in upper respiratory specimens during the acute phase of infection. The lowest concentration of SARS-CoV-2 viral copies this assay can detect is 138 copies/mL. A negative result does not preclude SARS-Cov-2 infection and should not be used as the sole basis for treatment or other patient management decisions. A negative result may occur with  improper specimen collection/handling, submission of specimen other than nasopharyngeal swab, presence of viral mutation(s) within the areas targeted by this assay, and inadequate number of viral copies(<138 copies/mL). A negative result must be combined with clinical observations, patient history, and  epidemiological information. The expected result is Negative.  Fact Sheet for Patients:  EntrepreneurPulse.com.au  Fact Sheet for Healthcare Providers:  IncredibleEmployment.be  This test is no t yet approved or cleared by the [place] FDA and  has been authorized for detection and/or diagnosis of SARS-CoV-2 by FDA under an Emergency Use Authorization (EUA). This EUA will remain  in effect (meaning this test can be used) for the duration of the COVID-19 declaration under Section 564(b)(1) of the Act, 21 U.S.C.section 360bbb-3(b)(1), unless the authorization is terminated  or revoked sooner.       Influenza A by PCR NEGATIVE NEGATIVE Final   Influenza B by PCR NEGATIVE NEGATIVE Final    Comment: (NOTE) The Xpert Xpress SARS-CoV-2/FLU/RSV plus assay is intended as an aid in the diagnosis of influenza from Nasopharyngeal swab specimens and should not be used as a sole basis for treatment. Nasal washings and aspirates are unacceptable for Xpert Xpress SARS-CoV-2/FLU/RSV testing.  Fact Sheet for Patients: EntrepreneurPulse.com.au  Fact Sheet for Healthcare Providers: IncredibleEmployment.be  This test is not yet approved or cleared by the [place] FDA and has been authorized for detection and/or diagnosis of SARS-CoV-2 by FDA under an Emergency Use Authorization (EUA). This EUA will remain in effect (meaning this test can be used) for the duration of the COVID-19 declaration under Section 564(b)(1) of the Act, 21 U.S.C. section 360bbb-3(b)(1), unless the authorization is terminated or revoked.  Performed at [facility], [place] [address], [place], [place] [address]          Radiology Studies: DG CHEST PORT 1 VIEW  Result Date: 08/15/2020 CLINICAL DATA:  Shortness of breath. Vascular congestion. Mild edema. EXAM: PORTABLE CHEST 1 VIEW COMPARISON:  August 14, 2020 FINDINGS:  Cardiomegaly.  Mild edema persists.  No other changes. IMPRESSION: Cardiomegaly and mild persistent edema. Electronically Signed   By: Dorise Bullion III M.D   On: 08/15/2020 09:53        Scheduled Meds: . sodium chloride   Intravenous Once  . atorvastatin  40 mg Oral Daily  . furosemide  80 mg Intravenous BID  . gabapentin  100 mg Oral BID  . insulin aspart  0-20 Units Subcutaneous TID WC  . insulin glargine  10 Units Subcutaneous QHS  . ipratropium-albuterol  3 mL Nebulization BID  . pantoprazole  40 mg Oral  QHS   Continuous Infusions: . sodium chloride Stopped (08/10/20 0249)     LOS: 7 days    Time spent: 25 minutes    Edwin Dada, MD [facility] 08/16/2020, 9:30 AM     Please page though [place] or Epic secure chat:  For [employer], Adult nurse

## 2020-08-17 LAB — CBC
HCT: 27 % — ABNORMAL LOW (ref 39.0–52.0)
Hemoglobin: 7.6 g/dL — ABNORMAL LOW (ref 13.0–17.0)
MCH: 22.7 pg — ABNORMAL LOW (ref 26.0–34.0)
MCHC: 28.1 g/dL — ABNORMAL LOW (ref 30.0–36.0)
MCV: 80.6 fL (ref 80.0–100.0)
Platelets: 106 10*3/uL — ABNORMAL LOW (ref 150–400)
RBC: 3.35 MIL/uL — ABNORMAL LOW (ref 4.22–5.81)
RDW: 19.3 % — ABNORMAL HIGH (ref 11.5–15.5)
WBC: 3 10*3/uL — ABNORMAL LOW (ref 4.0–10.5)
nRBC: 0 % (ref 0.0–0.2)

## 2020-08-17 LAB — COMPREHENSIVE METABOLIC PANEL
ALT: 15 U/L (ref 0–44)
AST: 22 U/L (ref 15–41)
Albumin: 3.2 g/dL — ABNORMAL LOW (ref 3.5–5.0)
Alkaline Phosphatase: 116 U/L (ref 38–126)
Anion gap: 9 (ref 5–15)
BUN: 22 mg/dL (ref 8–23)
CO2: 29 mmol/L (ref 22–32)
Calcium: 8.6 mg/dL — ABNORMAL LOW (ref 8.9–10.3)
Chloride: 100 mmol/L (ref 98–111)
Creatinine, Ser: 1.2 mg/dL (ref 0.61–1.24)
GFR, Estimated: >60 mL/min (ref >60)
Glucose, Bld: 128 mg/dL — ABNORMAL HIGH (ref 70–99)
Potassium: 3.5 mmol/L (ref 3.5–5.1)
Sodium: 138 mmol/L (ref 135–145)
Total Bilirubin: 0.7 mg/dL (ref 0.3–1.2)
Total Protein: 6.5 g/dL (ref 6.5–8.1)

## 2020-08-17 LAB — GLUCOSE, CAPILLARY
Glucose-Capillary: 94 mg/dL (ref 70–99)
Glucose-Capillary: 157 mg/dL — ABNORMAL HIGH (ref 70–99)
Glucose-Capillary: 140 mg/dL — ABNORMAL HIGH (ref 70–99)
Glucose-Capillary: 279 mg/dL — ABNORMAL HIGH (ref 70–99)

## 2020-08-17 MED ORDER — POTASSIUM CHLORIDE CRYS ER 20 MEQ PO TBCR
40.0000 meq | EXTENDED_RELEASE_TABLET | Freq: Once | ORAL | Status: AC
  Administered 2020-08-17: 40 meq via ORAL
  Filled 2020-08-17: qty 2

## 2020-08-17 MED ORDER — SPIRONOLACTONE 25 MG PO TABS
25.0000 mg | ORAL_TABLET | Freq: Every day | ORAL | Status: DC
  Administered 2020-08-17 – 2020-08-18 (×2): 25 mg via ORAL
  Filled 2020-08-17 (×2): qty 1

## 2020-08-17 NOTE — Progress Notes (Addendum)
Progress Note  Patient Name: [name] Date of Encounter: 08/17/2020  [facility] Cardiologist: Mertie Moores, MD   Subjective   No acute overnight events. Shortness of breath improving and close to baseline. He just ambulated with PT and did well with that. He thinks his lower extremity edema is improving as well. No chest pain.  Inpatient Medications    Scheduled Meds: . sodium chloride   Intravenous Once  . atorvastatin  40 mg Oral Daily  . furosemide  80 mg Intravenous BID  . gabapentin  100 mg Oral BID  . insulin aspart  0-20 Units Subcutaneous TID WC  . insulin glargine  10 Units Subcutaneous QHS  . ipratropium-albuterol  3 mL Nebulization BID  . pantoprazole  40 mg Oral QHS  . spironolactone  25 mg Oral Daily   Continuous Infusions: . sodium chloride Stopped (08/10/20 0249)   PRN Meds: acetaminophen **OR** acetaminophen, albuterol, morphine injection, nitroGLYCERIN, senna, sodium chloride   Vital Signs    Vitals:   08/16/20 2000 08/17/20 0400 08/17/20 0500 08/17/20 0854  BP:      Pulse:      Resp: 16 14    Temp:      TempSrc:      SpO2:    96%  Weight:   109.5 kg   Height:        Intake/Output Summary (Last 24 hours) at 08/17/2020 1235 Last data filed at 08/17/2020 1234 Gross per 24 hour  Intake 580 ml  Output 3475 ml  Net -2895 ml   Last 3 Weights 08/17/2020 08/16/2020 08/14/2020  Weight (lbs) 241 lb 6.5 oz 243 lb 12.8 oz 250 lb 4.8 oz  Weight (kg) 109.5 kg 110.587 kg 113.535 kg      Telemetry    Normal sinus rhythm with PVCs/couplets. Rates in the 60's to 70's. - Personally Reviewed  ECG    No new ECG tracing today. - Personally Reviewed  Physical Exam   GEN: No acute distress.   Neck: JVD difficult to assess due to body habitus.  Cardiac: RRR. No murmurs, rubs, or gallops.  Respiratory: No increased work of breathing. Decreased breath sounds in bases but otherwise clear to auscultation bilaterally. GI: Soft, non-distended, and  non-tender. MS: 2-3+ pitting edema of bilateral lower extremities. No deformity. Skin: Warm and dry. Neuro:  No focal deficits. Psych: Normal affect. Responds appropriately.  Labs    High Sensitivity Troponin:   Recent Labs  Lab 08/09/20 1733 08/09/20 1930  TROPONINIHS 13 13      Chemistry Recent Labs  Lab 08/15/20 0236 08/16/20 0049 08/17/20 0116  NA 137 139 138  K 3.8 3.9 3.5  CL 101 100 100  CO2 28 29 29   GLUCOSE 146* 160* 128*  BUN 25* 24* 22  CREATININE 1.21 1.20 1.20  CALCIUM 8.4* 8.4* 8.6*  PROT 6.4* 6.0* 6.5  ALBUMIN 3.2* 3.0* 3.2*  AST 20 18 22   ALT 15 15 15   ALKPHOS 107 94 116  BILITOT 0.9 0.7 0.7  GFRNONAA >60 >60 >60  ANIONGAP 8 10 9      Hematology Recent Labs  Lab 08/15/20 0236 08/16/20 0049 08/17/20 0116  WBC 3.5* 2.9* 3.0*  RBC 3.32* 3.18* 3.35*  HGB 7.6* 7.3* 7.6*  HCT 27.0* 25.7* 27.0*  MCV 81.3 80.8 80.6  MCH 22.9* 23.0* 22.7*  MCHC 28.1* 28.4* 28.1*  RDW 19.6* 19.2* 19.3*  PLT 115* 104* 106*    BNP Recent Labs  Lab 08/14/20 0353  BNP  414.2*     DDimer No results for input(s): DDIMER in the last 168 hours.   Radiology    DG CHEST PORT 1 VIEW  Result Date: 08/16/2020 CLINICAL DATA:  Shortness of breath. Congestive heart failure. Coronary artery disease. EXAM: PORTABLE CHEST 1 VIEW COMPARISON:  08/15/2020 FINDINGS: Stable cardiomegaly. Prior CABG again noted. Mild improvement in diffuse interstitial infiltrates is seen, consistent with decreased interstitial edema. No evidence of pulmonary consolidation. IMPRESSION: Mild decrease in diffuse interstitial infiltrates/edema. Stable cardiomegaly. Electronically Signed   By: Marlaine Hind M.D.   On: 08/16/2020 10:42    Cardiac Studies   Echocardiogram 05/27/2020: Impressions: 1. Left ventricular ejection fraction, by estimation, is 60 to 65%. The  left ventricle has normal function. The left ventricle has no regional  wall motion abnormalities. Left ventricular diastolic  parameters are  consistent with Grade III diastolic  dysfunction (restrictive).  2. Right ventricular systolic function is normal. The right ventricular  size is normal. There is mildly elevated pulmonary artery systolic  pressure. The estimated right ventricular systolic pressure is 64.6 mmHg.  3. Left atrial size was mildly dilated.  4. Right atrial size was mild to moderately dilated.  5. The mitral valve is normal in structure. No evidence of mitral valve  regurgitation. The mean mitral valve gradient is 3.0 mmHg with average  heart rate of 69 bpm. Moderate mitral annular calcification.  6. The aortic valve is tricuspid. There is moderate calcification of the  aortic valve. Aortic valve regurgitation is trivial. Mild aortic valve  stenosis. Aortic valve mean gradient measures 10.0 mmHg.  7. Aortic dilatation noted. There is mild dilatation of the ascending  aorta, measuring 42 mm.  8. The inferior vena cava is dilated in size with >50% respiratory  variability, suggesting right atrial pressure of 8 mmHg.    Patient Profile     74 y.o. male with a history of CAD s/p CABG in 8032, chronic diastolic CHF, hypertension, hyperlipidemia, type 2 diabetes mellitus, obstructive sleep apnea, hepatocellular carcinoma, cirrhosis, CKD stage III who presented with GI bleed with acute blood loss anemia. Cardiology consulted for further evaluation of CHF.   Assessment & Plan    Acute on Chronic Diastolic CHF - BNP 122. - Initial chest x-ray showed increased pulmonary edema with small pleural effusions. Repeat chest x-ray yesterday showed mild decrease in diffuse interstitial infiltrates/edema.  - Echo from 04/2020 showed LVEF of 60-65% with normal wall motion and grade 3 diastolic dysfunction. - Currently on IV Lasix 80mg  twice daily. Documented urinary output of 2.27 L yesterday and an additional 1.2 L so far this morning. Weight 241 lbs today, down 2 lbs from yesterday and 11 lbs since  12/14. Renal function stable. - Still has significant lower extremities edema. - Continue current dose of IV Lasix for now. Will give dose of K-Dur today given potassium trended down to 3.5.  - Continue home Spironolactone 25mg  daily.  - Continue to monitor daily weights, strict I/O's, and renal function.  CAD s/p CABG in 2015 - EKG non-ischemic.  - High-sensitivity troponin negative x2.  - No complaints of chest pain.  - No Aspirin due to chronic anemia and history of multiple GI bleeds. No beta-blocker given baseline bradycardia. Continue statin.  Hypertension - BP mostly well controlled.  - Continue diuresis.   Hyperlipidemia - LDL 93 in 10/2019. - LDL goal <70 given CAD. - LFTs stable but patient does have underlying hepatocellular carcinoma and cirrhosis. - Continue home Lipitor 40mg  daily.  Obstructive Sleep Apnea - Not compliant with CPAP.  CKD Stage II-III - Creatinine 1.20 today.  - Continue to monitor closely with diuresis.   Otherwise, per primary team. - Portal gastropathy with acute GI hemorrhage/acute blood loss anemia - Decompensated alcoholic liver cirrhosis - Hepatocellular cancer - Asthma      For questions or updates, please contact [facility] Please consult [email or web address] for contact info under        Signed, Darreld Mclean, PA-C  08/17/2020, 12:35 PM    Attending Note:   The patient was seen and examined.  Agree with assessment and plan as noted above.  Changes made to the above note as needed.  Patient seen and independently examined with  Sande Rives, PA .   We discussed all aspects of the encounter. I agree with the assessment and plan as stated above.  1.   Acute on chronic diastolic CHF:  Pt is diuresing well .   EF is 60-65% Has mild pulmonary HTN also  Has diuresed 6.5 liters so far this admission  2. HTN:   Cont diuresis  3.  CAD:  S/p CABG.  Off ASA due to GI bleed.      I have spent a total of 40 minutes with  patient reviewing hospital  notes , telemetry, EKGs, labs and examining patient as well as establishing an assessment and plan that was discussed with the patient. > 50% of time was spent in direct patient care.    Thayer Headings, Brooke Bonito., MD, [facility] 08/17/2020, 2:03 PM [address],  [place] Pager [phone]

## 2020-08-17 NOTE — Plan of Care (Signed)

## 2020-08-17 NOTE — Progress Notes (Signed)
[facility] PROGRESS NOTE    [name]  RSW:[id number] DOB: [birth year] DOA: 08/09/2020 PCP: [name], MD      Brief Narrative:  [name] is a 74 y.o. M with CAD s/p DES in 2013 CABG in 2015, dCHF, HTN, DM, GI bleed, cirrhosis and HCC s/p chemoembolization in Nov 2021 who presented with progressive dyspnea on exertion, edema, and melena for several days.  In the ER, hemoglobin 5.5, rales on exam, chest x-ray showed pulmonary edema.  He was started on blood transfusion, IV PPI, and IV Lasix.  GI were consulted.   Interim: Patient treated with IV PPI and IV Lasix  And underwent EGD that showed small non-bleeding varices, severe gastric portal hypertension with friability and contact bleeding in the stomach.    Weaned off octreotide and Protonix but still severely and grossly overloaded with fluid.       Assessment & Plan:  Portal gastropathy with acute gastrointestinal hemorrhage Acute blood loss anemia Hgb stable.  No melena, this appears to have stabilized, GI have signed off. -Continue pantoprazole     Acute on chronic diastolic CHF Net -2 L yesterday, still floridly fluid overloaded, JVP, legs 3+ edema, creatinine stable.   -Continue IV Lasix -At home spironolactone -Plan for discharge on torsemide 60 twice daily    Decompensated alcoholic liver cirrhosis Thrombocytopenia -Consult palliative care, appreciate recommendations  Coronary disease, secondary prevention Hypertension Blood pressure normal -Continue atorvastatin    CKD stage II Creatinine stable relative to baseline  Leukopenia  Diabetes controlled, without hyperglycemia.  With polyneuropathy. Glucoses controlled -Continue Lantus -Hold Amaryl and Metformin -Continue aspirin -Continue gabapentin  Hepatocellular cancer  Sleep apnea Obesity BMI 38   Asthma No active flare -Resume Anoro at discharge        Disposition: Status is:  Inpatient  Remains inpatient appropriate because:IV treatments appropriate due to intensity of illness or inability to take PO   Dispo: The patient is from: Home              Anticipated d/c is to: Home              Anticipated d/c date is: 1 day              Patient currently is not medically stable to d/c.      Patient was admitted with dyspnea due to CHF and anemia.  His acute GI bleed and acute blood loss anemia have stabilized, but he remains fluid overloaded.  Although his symptoms are improving, and I do not Feel he needs another transfusion, he still remains so fluid overloaded that I believe ongoing IV diuresis is prudent to avoid premature readmission        MDM: The below labs and imaging reports were reviewed and summarized above.  Medication management as above.  This is a severe exacebration of chronic idsease.    DVT prophylaxis: SCDs Start: 08/09/20 2211  Code Status: FULL Family Communication: Called to son, left VM    Consultants:   Cardiology  Procedures:   EGD 12/13 -- GAVE          Subjective: Patient is dyspnea feels about the same, he is still swollen.  No fever, chest pain.  He has some new productive cough, clearish frothy sputum.  No confusion.      Objective: Vitals:   08/16/20 2000 08/17/20 0400 08/17/20 0500 08/17/20 0854  BP:      Pulse:      Resp:  16 14    Temp:      TempSrc:      SpO2:    96%  Weight:   109.5 kg   Height:        Intake/Output Summary (Last 24 hours) at 08/17/2020 1110 Last data filed at 08/17/2020 7591 Gross per 24 hour  Intake 100 ml  Output 3475 ml  Net -3375 ml   Filed Weights   08/14/20 0700 08/16/20 0533 08/17/20 0500  Weight: 113.5 kg 110.6 kg 109.5 kg    Examination: General appearance: Obese adult male, sitting at the edge of the bed, interactive     HEENT: Anicteric, conjunctival pink, lids and lashes normal.  No nasal deformity, discharge, or epistaxis. Skin: Skin warm and  dry, no jaundice.  Chronic venous stasis changes to legs Cardiac: Regular rate and rhythm, no murmurs, JVP elevated, 3+ lower extremity edema Respiratory: Normal respiratory rate and rhythm, no wheezing, do appreciate rales both bases. Abdomen: Abdomen soft without tenderness palpation or guarding, MSK: Clubbing noted.  No other deformities or effusions of the large joints. Neuro: Awake and alert, extraocular movements intact, moves all extremities with generalized weakness, but symmetric strength, speech fluent Psych: Sensorium intact and responding to questions, attention normal, affect normal, judgment insight appear normal        Data Reviewed: I have personally reviewed following labs and imaging studies:  CBC: Recent Labs  Lab 08/12/20 0150 08/13/20 0047 08/14/20 0350 08/15/20 0236 08/16/20 0049 08/17/20 0116  WBC 4.9 4.5 3.8* 3.5* 2.9* 3.0*  NEUTROABS 4.0 3.7 2.9 2.6 2.2  --   HGB 7.6* 8.2* 7.5* 7.6* 7.3* 7.6*  HCT 25.5* 28.9* 26.4* 27.0* 25.7* 27.0*  MCV 80.4 81.9 81.0 81.3 80.8 80.6  PLT 124* 100* 111* 115* 104* 638*   Basic Metabolic Panel: Recent Labs  Lab 08/13/20 0047 08/14/20 0350 08/15/20 0236 08/16/20 0049 08/17/20 0116  NA 137 135 137 139 138  K 4.0 3.7 3.8 3.9 3.5  CL 102 100 101 100 100  CO2 26 26 28 29 29   GLUCOSE 134* 122* 146* 160* 128*  BUN 20 19 25* 24* 22  CREATININE 1.36* 1.30* 1.21 1.20 1.20  CALCIUM 8.2* 8.1* 8.4* 8.4* 8.6*  MG 2.0 2.0 2.0 2.0  --   PHOS 3.3 3.2 2.9 3.1  --    GFR: Estimated Creatinine Clearance: 64.8 mL/min (by C-G formula based on SCr of 1.2 mg/dL). Liver Function Tests: Recent Labs  Lab 08/13/20 0047 08/14/20 0350 08/15/20 0236 08/16/20 0049 08/17/20 0116  AST 20 18 20 18 22   ALT 15 14 15 15 15   ALKPHOS 98 93 107 94 116  BILITOT 1.6* 1.3* 0.9 0.7 0.7  PROT 6.5 6.0* 6.4* 6.0* 6.5  ALBUMIN 3.3* 3.1* 3.2* 3.0* 3.2*   No results for input(s): LIPASE, AMYLASE in the last 168 hours. No results for input(s):  AMMONIA in the last 168 hours. Coagulation Profile: Recent Labs  Lab 08/10/20 1525  INR 1.2   Cardiac Enzymes: No results for input(s): CKTOTAL, CKMB, CKMBINDEX, TROPONINI in the last 168 hours. BNP (last 3 results) No results for input(s): PROBNP in the last 8760 hours. HbA1C: No results for input(s): HGBA1C in the last 72 hours. CBG: Recent Labs  Lab 08/16/20 0822 08/16/20 1156 08/16/20 1658 08/16/20 2159 08/17/20 0738  GLUCAP 91 194* 148* 127* 94   Lipid Profile: No results for input(s): CHOL, HDL, LDLCALC, TRIG, CHOLHDL, LDLDIRECT in the last 72 hours. Thyroid Function Tests: No results for input(s): TSH,  T4TOTAL, FREET4, T3FREE, THYROIDAB in the last 72 hours. Anemia Panel: No results for input(s): VITAMINB12, FOLATE, FERRITIN, TIBC, IRON, RETICCTPCT in the last 72 hours. Urine analysis:    Component Value Date/Time   COLORURINE YELLOW 05/06/2020 [place] 05/06/2020 0954   LABSPEC 1.009 05/06/2020 0954   PHURINE 7.0 05/06/2020 0954   GLUCOSEU NEGATIVE 05/06/2020 0954   HGBUR NEGATIVE 05/06/2020 0954   BILIRUBINUR NEGATIVE 07/29/2019 1849   KETONESUR NEGATIVE 05/06/2020 0954   PROTEINUR NEGATIVE 05/06/2020 0954   UROBILINOGEN 0.2 03/29/2014 1546   NITRITE NEGATIVE 05/06/2020 0954   LEUKOCYTESUR NEGATIVE 05/06/2020 0954   Sepsis Labs: @LABRCNTIP (procalcitonin:4,lacticacidven:4)  ) Recent Results (from the past 240 hour(s))  Resp Panel by RT-PCR (Flu A&B, Covid) Nasopharyngeal Swab     Status: None   Collection Time: 08/09/20  7:47 PM   Specimen: Nasopharyngeal Swab; Nasopharyngeal(NP) swabs in vial transport medium  Result Value Ref Range Status   SARS Coronavirus 2 by RT PCR NEGATIVE NEGATIVE Final    Comment: (NOTE) SARS-CoV-2 target nucleic acids are NOT DETECTED.  The SARS-CoV-2 RNA is generally detectable in upper respiratory specimens during the acute phase of infection. The lowest concentration of SARS-CoV-2 viral copies this  assay can detect is 138 copies/mL. A negative result does not preclude SARS-Cov-2 infection and should not be used as the sole basis for treatment or other patient management decisions. A negative result may occur with  improper specimen collection/handling, submission of specimen other than nasopharyngeal swab, presence of viral mutation(s) within the areas targeted by this assay, and inadequate number of viral copies(<138 copies/mL). A negative result must be combined with clinical observations, patient history, and epidemiological information. The expected result is Negative.  Fact Sheet for Patients:  EntrepreneurPulse.com.au  Fact Sheet for Healthcare Providers:  IncredibleEmployment.be  This test is no t yet approved or cleared by the [place] FDA and  has been authorized for detection and/or diagnosis of SARS-CoV-2 by FDA under an Emergency Use Authorization (EUA). This EUA will remain  in effect (meaning this test can be used) for the duration of the COVID-19 declaration under Section 564(b)(1) of the Act, 21 U.S.C.section 360bbb-3(b)(1), unless the authorization is terminated  or revoked sooner.       Influenza A by PCR NEGATIVE NEGATIVE Final   Influenza B by PCR NEGATIVE NEGATIVE Final    Comment: (NOTE) The Xpert Xpress SARS-CoV-2/FLU/RSV plus assay is intended as an aid in the diagnosis of influenza from Nasopharyngeal swab specimens and should not be used as a sole basis for treatment. Nasal washings and aspirates are unacceptable for Xpert Xpress SARS-CoV-2/FLU/RSV testing.  Fact Sheet for Patients: EntrepreneurPulse.com.au  Fact Sheet for Healthcare Providers: IncredibleEmployment.be  This test is not yet approved or cleared by the [place] FDA and has been authorized for detection and/or diagnosis of SARS-CoV-2 by FDA under an Emergency Use Authorization (EUA). This EUA will  remain in effect (meaning this test can be used) for the duration of the COVID-19 declaration under Section 564(b)(1) of the Act, 21 U.S.C. section 360bbb-3(b)(1), unless the authorization is terminated or revoked.  Performed at [facility], [place] [address] [place], [place] [address]          Radiology Studies: DG CHEST PORT 1 VIEW  Result Date: 08/16/2020 CLINICAL DATA:  Shortness of breath. Congestive heart failure. Coronary artery disease. EXAM: PORTABLE CHEST 1 VIEW COMPARISON:  08/15/2020 FINDINGS: Stable cardiomegaly. Prior CABG again noted. Mild improvement in diffuse interstitial infiltrates is  seen, consistent with decreased interstitial edema. No evidence of pulmonary consolidation. IMPRESSION: Mild decrease in diffuse interstitial infiltrates/edema. Stable cardiomegaly. Electronically Signed   By: Marlaine Hind M.D.   On: 08/16/2020 10:42        Scheduled Meds: . sodium chloride   Intravenous Once  . atorvastatin  40 mg Oral Daily  . furosemide  80 mg Intravenous BID  . gabapentin  100 mg Oral BID  . insulin aspart  0-20 Units Subcutaneous TID WC  . insulin glargine  10 Units Subcutaneous QHS  . ipratropium-albuterol  3 mL Nebulization BID  . pantoprazole  40 mg Oral QHS  . spironolactone  25 mg Oral Daily   Continuous Infusions: . sodium chloride Stopped (08/10/20 0249)     LOS: 8 days    Time spent: 35 minutes    Edwin Dada, MD [facility] 08/17/2020, 11:10 AM     Please page though [place] or Epic secure chat:  For [employer], Adult nurse

## 2020-08-17 NOTE — Progress Notes (Signed)
Physical Therapy Treatment Patient Details Name: [name] MRN: [id number] DOB: 11/12/1945 Today's Date: 08/17/2020    History of Present Illness 74 yo male with onset of anasarca and pleural effusion was admitted for symptoms of GI bleed and had EGD on 12.13.  He is anemic at 5.5 initially, SOB and had epigastric pain.  EGD located gastric nonbleeding varices with portal hypertensive gastropathy.  PMHx:  liver mass, CHF, CAD, cirrhosis, transfusions, OSA, anemia, ankle fracture ORIF    PT Comments    Pt with good tolerance for gait training this day, requiring min cuing for form and safety during hallway mobility. Pt demonstrating impaired higher level standing balance, especially dynamically, which PT challenged during gait. Pt continues to complain of LLE pain during standing tasks, and reports he is frustrated with LE swelling. PT to continue to follow acutely, will progress mobility as able.     Follow Up Recommendations  Home health PT;Supervision for mobility/OOB     Equipment Recommendations  None recommended by PT    Recommendations for Other Services       Precautions / Restrictions Precautions Precautions: Fall Precaution Comments: monitor O2 sats Restrictions Other Position/Activity Restrictions: BLE edema; h/o painful L knee    Mobility  Bed Mobility Overal bed mobility: Needs Assistance Bed Mobility: Supine to Sit     Supine to sit: Modified independent (Device/Increase time)        Transfers Overall transfer level: Needs assistance Equipment used: Rolling walker (2 wheeled) Transfers: Sit to/from Stand Sit to Stand: Min guard         General transfer comment: min guard for safety, verbal cuing for hand placement when rising.  Ambulation/Gait Ambulation/Gait assistance: Min guard Gait Distance (Feet): 350 Feet Assistive device: Rolling walker (2 wheeled) Gait Pattern/deviations: Step-through pattern;Decreased stride length Gait velocity:  decr   General Gait Details: min guard for safety, especially when PT challenging pt dynamic balance. Verbal cuing for placement in RW, upright posture.   Stairs             Wheelchair Mobility    Modified Rankin (Stroke Patients Only)       Balance Overall balance assessment: Needs assistance Sitting-balance support: Feet supported;No upper extremity supported Sitting balance-Leahy Scale: Fair Sitting balance - Comments: able to static sit EOB to eat with no LOB   Standing balance support: Bilateral upper extremity supported Standing balance-Leahy Scale: Poor Standing balance comment: reliant on bil UE support from RW             High level balance activites: Head turns;Direction changes High Level Balance Comments: Increased time and difficulty following commands during gait: head turns horiz/vertical, 180* directional change            Cognition Arousal/Alertness: Awake/alert Behavior During Therapy: WFL for tasks assessed/performed Overall Cognitive Status: Within Functional Limits for tasks assessed                                 General Comments: increased processing time, suspect partially due to [facility]      Exercises General Exercises - Lower Extremity Long Arc Quad: AROM;Both;10 reps;Seated    General Comments General comments (skin integrity, edema, etc.): SPO2 92% and greater on RA, DOE 2/4 during mobility      Pertinent Vitals/Pain Pain Assessment: Faces Faces Pain Scale: Hurts little more Pain Location: L knee with mobility Pain Descriptors / Indicators: Grimacing Pain Intervention(s): Limited activity  within patient's tolerance;Monitored during session;Repositioned    Home Living                      Prior Function            PT Goals (current goals can now be found in the care plan section) Acute Rehab PT Goals Patient Stated Goal: decreased edema in BLE. PT Goal Formulation: With patient Time For Goal  Achievement: 08/26/20 Potential to Achieve Goals: Good Progress towards PT goals: Progressing toward goals    Frequency    Min 3X/week      PT Plan Current plan remains appropriate    Co-evaluation              AM-PAC PT "6 Clicks" Mobility   Outcome Measure  Help needed turning from your back to your side while in a flat bed without using bedrails?: A Little Help needed moving from lying on your back to sitting on the side of a flat bed without using bedrails?: A Little Help needed moving to and from a bed to a chair (including a wheelchair)?: A Little Help needed standing up from a chair using your arms (e.g., wheelchair or bedside chair)?: A Little Help needed to walk in hospital room?: A Little Help needed climbing 3-5 steps with a railing? : A Little 6 Click Score: 18    End of Session Equipment Utilized During Treatment: Gait belt Activity Tolerance: Patient tolerated treatment well Patient left: in chair;with call bell/phone within reach;Other (comment) (MD in room) Nurse Communication: Mobility status PT Visit Diagnosis: Unsteadiness on feet (R26.81);Muscle weakness (generalized) (M62.81);Difficulty in walking, not elsewhere classified (R26.2)     Time: 1240-1300 PT Time Calculation (min) (ACUTE ONLY): 20 min  Charges:  $Gait Training: 8-22 mins                    Stacie Glaze, PT Acute Rehabilitation Services Pager [phone]  Office [phone]  [place] 08/17/2020, 1:30 PM

## 2020-08-18 ENCOUNTER — Other Ambulatory Visit: Payer: Self-pay | Admitting: Student

## 2020-08-18 ENCOUNTER — Inpatient Hospital Stay: Payer: Medicare HMO | Admitting: Internal Medicine

## 2020-08-18 DIAGNOSIS — I5033 Acute on chronic diastolic (congestive) heart failure: Secondary | ICD-10-CM

## 2020-08-18 DIAGNOSIS — Z79899 Other long term (current) drug therapy: Secondary | ICD-10-CM

## 2020-08-18 LAB — BASIC METABOLIC PANEL
Anion gap: 10 (ref 5–15)
BUN: 22 mg/dL (ref 8–23)
CO2: 28 mmol/L (ref 22–32)
Calcium: 8.5 mg/dL — ABNORMAL LOW (ref 8.9–10.3)
Chloride: 99 mmol/L (ref 98–111)
Creatinine, Ser: 1.17 mg/dL (ref 0.61–1.24)
GFR, Estimated: >60 mL/min (ref >60)
Glucose, Bld: 114 mg/dL — ABNORMAL HIGH (ref 70–99)
Potassium: 4 mmol/L (ref 3.5–5.1)
Sodium: 137 mmol/L (ref 135–145)

## 2020-08-18 LAB — CBC
HCT: 25.1 % — ABNORMAL LOW (ref 39.0–52.0)
Hemoglobin: 7.4 g/dL — ABNORMAL LOW (ref 13.0–17.0)
MCH: 23.9 pg — ABNORMAL LOW (ref 26.0–34.0)
MCHC: 29.5 g/dL — ABNORMAL LOW (ref 30.0–36.0)
MCV: 81.2 fL (ref 80.0–100.0)
Platelets: 92 10*3/uL — ABNORMAL LOW (ref 150–400)
RBC: 3.09 MIL/uL — ABNORMAL LOW (ref 4.22–5.81)
RDW: 19.8 % — ABNORMAL HIGH (ref 11.5–15.5)
WBC: 2.7 10*3/uL — ABNORMAL LOW (ref 4.0–10.5)
nRBC: 0 % (ref 0.0–0.2)

## 2020-08-18 LAB — GLUCOSE, CAPILLARY
Glucose-Capillary: 91 mg/dL (ref 70–99)
Glucose-Capillary: 172 mg/dL — ABNORMAL HIGH (ref 70–99)

## 2020-08-18 MED ORDER — SPIRONOLACTONE 25 MG PO TABS: 25.0000 mg | ORAL_TABLET | Freq: Every day | ORAL | 2 refills | Status: DC

## 2020-08-18 MED ORDER — PANTOPRAZOLE SODIUM 40 MG PO TBEC: 40.0000 mg | DELAYED_RELEASE_TABLET | Freq: Every day | ORAL | 1 refills | Status: DC

## 2020-08-18 MED ORDER — TORSEMIDE 20 MG PO TABS
40.0000 mg | ORAL_TABLET | Freq: Two times a day (BID) | ORAL | Status: DC
  Filled 2020-08-18: qty 2

## 2020-08-18 MED ORDER — POTASSIUM CHLORIDE CRYS ER 20 MEQ PO TBCR: 20.0000 meq | EXTENDED_RELEASE_TABLET | Freq: Every day | ORAL | 3 refills | Status: DC

## 2020-08-18 MED ORDER — POTASSIUM CHLORIDE CRYS ER 10 MEQ PO TBCR
10.0000 meq | EXTENDED_RELEASE_TABLET | Freq: Two times a day (BID) | ORAL | Status: DC
  Administered 2020-08-18: 10 meq via ORAL
  Filled 2020-08-18: qty 1

## 2020-08-18 MED ORDER — TORSEMIDE 20 MG PO TABS: 40.0000 mg | ORAL_TABLET | Freq: Two times a day (BID) | ORAL | 3 refills | Status: DC

## 2020-08-18 MED FILL — POTASSIUM CHLORIDE 20meqER: 20 | 30 days supply | Qty: 30 | Fill #0

## 2020-08-18 MED FILL — TORSEMIDE 20 MG TABLET: 20 | 30 days supply | Qty: 120 | Fill #0

## 2020-08-18 MED FILL — SPIRONOLACTONE 25 MG TABLET: 25 | 30 days supply | Qty: 30 | Fill #0

## 2020-08-18 MED FILL — PANTOPRAZOLE SOD DR 40 MG T: 40 | 30 days supply | Qty: 30 | Fill #0

## 2020-08-18 NOTE — Progress Notes (Signed)
Occupational Therapy Treatment Patient Details Name: [name] MRN: [id number] DOB: 08/26/1946 Today's Date: 08/18/2020    History of present illness 74 yo male with onset of anasarca and pleural effusion was admitted for symptoms of GI bleed and had EGD on 12.13.  He is anemic at 5.5 initially, SOB and had epigastric pain.  EGD located gastric nonbleeding varices with portal hypertensive gastropathy.  PMHx:  liver mass, CHF, CAD, cirrhosis, transfusions, OSA, anemia, ankle fracture ORIF   OT comments  Pt making steady progress towards OT goals this session. Session focus on BADL reeducation and functional mobility. Pt able to complete functional mobility greater than a household distance with RW and min guard assist. Pt on RA during session with SpO2 >/= 93% HR max 87 bpm with mobility. Pt currently requires MIN A for UB ADLs and supervision for LB ADLs from EOB. Pt continues to present with increased edema in BLEs and increased pain in L knee from prior injury. Pt would continue to benefit from skilled occupational therapy while admitted and after d/c to address the below listed limitations in order to improve overall functional mobility and facilitate independence with BADL participation. DC plan remains appropriate, will follow acutely per POC.     Follow Up Recommendations  Home health OT;Supervision/Assistance - 24 hour    Equipment Recommendations  3 in 1 bedside commode    Recommendations for Other Services      Precautions / Restrictions Precautions Precautions: Fall Precaution Comments: monitor O2 sats Restrictions Weight Bearing Restrictions: No Other Position/Activity Restrictions: BLE edema; h/o painful L knee       Mobility Bed Mobility               General bed mobility comments: sitting EOB upon arrival  Transfers Overall transfer level: Needs assistance Equipment used: Rolling walker (2 wheeled) Transfers: Sit to/from Stand Sit to Stand: Min guard          General transfer comment: min guard for safety, verbal cuing for hand placement when rising.    Balance Overall balance assessment: Needs assistance Sitting-balance support: Feet supported;No upper extremity supported Sitting balance-Leahy Scale: Fair Sitting balance - Comments: able to static sit EOB to eat with no LOB   Standing balance support: Bilateral upper extremity supported Standing balance-Leahy Scale: Poor Standing balance comment: reliant on bil UE support from RW                           ADL either performed or assessed with clinical judgement   ADL Overall ADL's : Needs assistance/impaired             Lower Body Bathing: Supervison/ safety;Sitting/lateral leans Lower Body Bathing Details (indicate cue type and reason): simulated via LB dressing from EOB Upper Body Dressing : Minimal assistance;Sitting Upper Body Dressing Details (indicate cue type and reason): to don gown as back side cover Lower Body Dressing: Supervision/safety;Sitting/lateral leans Lower Body Dressing Details (indicate cue type and reason): to adjust socks from EOB Toilet Transfer: Min Marine scientist Details (indicate cue type and reason): simulated via functional mobility wit Rw and min guard for safety         Functional mobility during ADLs: Min guard;Rolling walker General ADL Comments: pt able to complete functional mobility greater than a household distance with RW with min guard assis with pt on RA with VSS throughout     Vision       Perception  Praxis      Cognition Arousal/Alertness: Awake/alert Behavior During Therapy: WFL for tasks assessed/performed Overall Cognitive Status: Within Functional Limits for tasks assessed                                 General Comments: increased processing time, suspect partially due to [facility]        Exercises     Shoulder Instructions       General Comments pt on RA  throughout session with SpO2 >/= 93% during mobility tasks, pt continues to present with swelling in Bil feet, encouaged ankle pumps and walking with staff as edema mgmt strategy.    Pertinent Vitals/ Pain       Pain Assessment: Faces Faces Pain Scale: Hurts a little bit Pain Location: L knee with mobility Pain Descriptors / Indicators: Grimacing Pain Intervention(s): Limited activity within patient's tolerance;Monitored during session  Home Living                                          Prior Functioning/Environment              Frequency  Min 2X/week        Progress Toward Goals  OT Goals(current goals can now be found in the care plan section)  Progress towards OT goals: Progressing toward goals  Acute Rehab OT Goals Patient Stated Goal: decreased edema in BLE. OT Goal Formulation: With patient Time For Goal Achievement: 08/26/20 Potential to Achieve Goals: Good  Plan Discharge plan remains appropriate;Frequency remains appropriate    Co-evaluation                 AM-PAC OT "6 Clicks" Daily Activity     Outcome Measure   Help from another person eating meals?: None Help from another person taking care of personal grooming?: None Help from another person toileting, which includes using toliet, bedpan, or urinal?: A Little Help from another person bathing (including washing, rinsing, drying)?: A Little Help from another person to put on and taking off regular upper body clothing?: None Help from another person to put on and taking off regular lower body clothing?: A Little 6 Click Score: 21    End of Session Equipment Utilized During Treatment: Rolling walker  OT Visit Diagnosis: Unsteadiness on feet (R26.81);Muscle weakness (generalized) (M62.81);Pain Pain - Right/Left: Left Pain - part of body: Knee   Activity Tolerance Patient tolerated treatment well   Patient Left in bed;with call bell/phone within reach;Other (comment)  (sitting EOB)   Nurse Communication          Time: 7494-4967 OT Time Calculation (min): 16 min  Charges: OT General Charges $OT Visit: 1 Visit OT Treatments $Self Care/Home Management : 8-22 mins  Lanier Clam., COTA/L Acute Rehabilitation Services [phone] [place] 08/18/2020, 11:43 AM

## 2020-08-18 NOTE — Care Management Important Message (Signed)
Important Message  Patient Details  Name: [name] MRN: [id number] Date of Birth: 09-04-1945   Medicare Important Message Given:  Yes     [name] 08/18/2020, 2:42 PM

## 2020-08-18 NOTE — Progress Notes (Signed)
Progress Note  Patient Name: [name] Date of Encounter: 08/18/2020  [facility] Cardiologist: Mertie Moores, MD   Subjective   No acute overnight events. Shortness of breath improving and close to baseline. He just ambulated with PT and did well with that. He thinks his lower extremity edema is improving as well. No chest pain.  Has diuresed 8.9 liters    Inpatient Medications    Scheduled Meds: . sodium chloride   Intravenous Once  . atorvastatin  40 mg Oral Daily  . furosemide  80 mg Intravenous BID  . gabapentin  100 mg Oral BID  . insulin aspart  0-20 Units Subcutaneous TID WC  . insulin glargine  10 Units Subcutaneous QHS  . ipratropium-albuterol  3 mL Nebulization BID  . pantoprazole  40 mg Oral QHS  . spironolactone  25 mg Oral Daily   Continuous Infusions: . sodium chloride Stopped (08/10/20 0249)   PRN Meds: acetaminophen **OR** acetaminophen, albuterol, morphine injection, nitroGLYCERIN, senna, sodium chloride   Vital Signs    Vitals:   08/17/20 1945 08/17/20 2300 08/18/20 0500 08/18/20 0727  BP: (!) 127/49 (!) 116/56 (!) 110/52   Pulse: 64 65 64   Resp: 18 16 16    Temp: 97.8 F (36.6 C) 97.8 F (36.6 C) 97.9 F (36.6 C)   TempSrc: Oral Oral Oral   SpO2: 100% 100% 100% 99%  Weight:    107.5 kg  Height:        Intake/Output Summary (Last 24 hours) at 08/18/2020 0947 Last data filed at 08/18/2020 0545 Gross per 24 hour  Intake 1080 ml  Output 1200 ml  Net -120 ml   Last 3 Weights 08/18/2020 08/17/2020 08/16/2020  Weight (lbs) 236 lb 15.9 oz 241 lb 6.5 oz 243 lb 12.8 oz  Weight (kg) 107.5 kg 109.5 kg 110.587 kg      Telemetry    Normal sinus rhythm with PVCs/couplets. Rates in the 60's to 70's. - Personally Reviewed  ECG    No new ECG tracing today. - Personally Reviewed  Physical Exam   Physical Exam: Blood pressure (!) 110/52, pulse 64, temperature 97.9 F (36.6 C), temperature source Oral, resp. rate 16, height 5\' 8"   (1.727 m), weight 107.5 kg, SpO2 99 %.  GEN:   Elderly male  HEENT: Normal NECK: No JVD; No carotid bruits LYMPHATICS: No lymphadenopathy CARDIAC: RRR , no murmurs, rubs, gallops RESPIRATORY:  Clear to auscultation without rales, wheezing or rhonchi  ABDOMEN: Soft, non-tender, non-distended MUSCULOSKELETAL:   2-3 + pitting edema  SKIN: Warm and dry NEUROLOGIC:  Alert and oriented x 3   Labs    High Sensitivity Troponin:   Recent Labs  Lab 08/09/20 1733 08/09/20 1930  TROPONINIHS 13 13      Chemistry Recent Labs  Lab 08/15/20 0236 08/16/20 0049 08/17/20 0116 08/18/20 0109  NA 137 139 138 137  K 3.8 3.9 3.5 4.0  CL 101 100 100 99  CO2 28 29 29 28   GLUCOSE 146* 160* 128* 114*  BUN 25* 24* 22 22  CREATININE 1.21 1.20 1.20 1.17  CALCIUM 8.4* 8.4* 8.6* 8.5*  PROT 6.4* 6.0* 6.5  --   ALBUMIN 3.2* 3.0* 3.2*  --   AST 20 18 22   --   ALT 15 15 15   --   ALKPHOS 107 94 116  --   BILITOT 0.9 0.7 0.7  --   GFRNONAA >60 >60 >60 >60  ANIONGAP 8 10 9  10  Hematology Recent Labs  Lab 08/16/20 0049 08/17/20 0116 08/18/20 0109  WBC 2.9* 3.0* 2.7*  RBC 3.18* 3.35* 3.09*  HGB 7.3* 7.6* 7.4*  HCT 25.7* 27.0* 25.1*  MCV 80.8 80.6 81.2  MCH 23.0* 22.7* 23.9*  MCHC 28.4* 28.1* 29.5*  RDW 19.2* 19.3* 19.8*  PLT 104* 106* 92*    BNP Recent Labs  Lab 08/14/20 0353  BNP 414.2*     DDimer No results for input(s): DDIMER in the last 168 hours.   Radiology    No results found.  Cardiac Studies   Echocardiogram 05/27/2020: Impressions: 1. Left ventricular ejection fraction, by estimation, is 60 to 65%. The  left ventricle has normal function. The left ventricle has no regional  wall motion abnormalities. Left ventricular diastolic parameters are  consistent with Grade III diastolic  dysfunction (restrictive).  2. Right ventricular systolic function is normal. The right ventricular  size is normal. There is mildly elevated pulmonary artery systolic  pressure.  The estimated right ventricular systolic pressure is 40.0 mmHg.  3. Left atrial size was mildly dilated.  4. Right atrial size was mild to moderately dilated.  5. The mitral valve is normal in structure. No evidence of mitral valve  regurgitation. The mean mitral valve gradient is 3.0 mmHg with average  heart rate of 69 bpm. Moderate mitral annular calcification.  6. The aortic valve is tricuspid. There is moderate calcification of the  aortic valve. Aortic valve regurgitation is trivial. Mild aortic valve  stenosis. Aortic valve mean gradient measures 10.0 mmHg.  7. Aortic dilatation noted. There is mild dilatation of the ascending  aorta, measuring 42 mm.  8. The inferior vena cava is dilated in size with >50% respiratory  variability, suggesting right atrial pressure of 8 mmHg.    Patient Profile     74 y.o. male with a history of CAD s/p CABG in 8676, chronic diastolic CHF, hypertension, hyperlipidemia, type 2 diabetes mellitus, obstructive sleep apnea, hepatocellular carcinoma, cirrhosis, CKD stage III who presented with GI bleed with acute blood loss anemia. Cardiology consulted for further evaluation of CHF.   Assessment & Plan    Acute on Chronic Diastolic CHF  he has diuresed 8.9 liters Still has leg edema - mostly because he keeps them down Will change to torsemide 40 mg bid kdur 10 meq bid Cont spironolactone 25 mg a day    CAD s/p CABG in 2015 No angina   Hypertension Fairly well controlled.   Hyperlipidemia   - Continue home Lipitor 40mg  daily.  Obstructive Sleep Apnea - Not compliant with CPAP.  CKD Stage II-III - Creatinine 1.20 today.  - Continue to monitor closely with diuresis.    if he goes home, would have him come to our office for BMP and TOC visit in 7-10 days    Mertie Moores, MD  08/18/2020 9:57 AM    [address],  [address], [place]  [address] Phone: [phone]; Fax: [phone]

## 2020-08-18 NOTE — Discharge Summary (Signed)
Physician Discharge Summary  [name] ZOX:[id number] DOB: 1945-12-14 DOA: 08/09/2020  PCP: John Koch, MD  Admit date: 08/09/2020 Discharge date: 08/18/2020  Admitted From: Home  Disposition:  Homw with HH   Recommendations for Outpatient Follow-up:  1. Follow up with Cardiology [name] in 1 week with BMP 2. Follow up with PCP Dr. Sharlet [name] in 2 weeks 3. Follow up with [facility] in 4-6 weeks 4. Recommend palliative care referral 5. Dr. Sharlet [name] or Dr. Michail [name]: If there are delays in Palliative Care follow up, please review EOL goals     Home Health: PT due to ongoing balance issues  Equipment/Devices: None  Discharge Condition: Declining  CODE STATUS: FULL Diet recommendation: Low sodium  Brief/Interim Summary: [name] is a 74 y.o. M with cirrhosis and portal hypertension, CAD s/p DES in 2013 CABG in 2015, dCHF, HTN, DM, GI bleed, and HCC s/p chemoembolization in Nov 2021 who presented with progressive dyspnea on exertion, edema, and melena for several days.  In the ER, hemoglobin 5.5, rales on exam, chest x-ray showed pulmonary edema.  He was started on blood transfusion, IV PPI, and IV Lasix.  GI were consulted.      PRINCIPAL HOSPITAL DIAGNOSIS: Acute on chronic diastolic CHF due to blood loss anemia    Discharge Diagnoses:   Portal gastropathy with acute gastrointestinal hemorrhage Acute blood loss anemia Patient admitted and started on IV PPI, octreotide, and IV Lasix  Underwent EGD that showed small non-bleeding varices, severe gastric portal hypertension with friability and contact bleeding in the stomach.    Was subsequently weaned off octreotide and Protonix transitioned to PO.    -Discharged on PPI, iron and with GI follow up  -Palliative Care consulted here.  Recommend Palliative Care consultation continue after discharge and consider transitioning to Hospice    Acute on chronic diastolic CHF Patient started on IV Lasix and  diuresed 10L.  Leg swelling still present, but at baseline.    Discharged on torsemide 40 BID and spironolactone.  Has close Cardiology follow up.  Counseled on low sodium diet and weights.  Emphasized with family.     Decompensated alcoholic liver cirrhosis Thrombocytopenia and leukopenia due to cirrhosis Hepatocellular cancer  Coronary disease, secondary prevention Hypertension  CKD stage II  Diabetes controlled, without hyperglycemia.  With polyneuropathy.   Sleep apnea Obesity BMI 38   Asthma No active flare           Discharge Instructions  Discharge Instructions    (HEART FAILURE PATIENTS) Call MD:  Anytime you have any of the following symptoms: 1) 3 pound weight gain in 24 hours or 5 pounds in 1 week 2) shortness of breath, with or without a dry hacking cough 3) swelling in the hands, feet or stomach 4) if you have to sleep on extra pillows at night in order to breathe.   Complete by: As directed    Diet - low sodium heart healthy   Complete by: As directed    Discharge instructions   Complete by: As directed    From Dr. Loleta [name]: You were admitted for feeling out of breath. Here, we found that this was from anemia and a congestive heart failure flare.  For the anemia: You were treated with a blood transfusion The endoscopy showed that the bleeding was from consequences of your cirrhosis (the cirrhosis makes the stomach bleed more easily) AVOID NSAIDS or anything with ibuprofen, Aleve, Goody's, BC powders or other NSAIDS  Take pantoprazole Protonix 40  mg daily  Follow up with your primary care doctor and your liver specialist at [facility] Take iron supplements daily   For the heart failure: You were treated with strong diuretics. You should take torsemide 40 mg twice daily Follow up with Dr. Elmarie [name] office in 1 week for labs and an office visit  Take spironolactone 25 mg daily Take your weight every morning  If you notice more than 3 lbs  weight gain in a day (from the day before) or more than 5 lbs weight gain overall, call Dr. Elmarie [name] office   Increase activity slowly   Complete by: As directed      Allergies as of 08/18/2020   No Known Allergies     Medication List    STOP taking these medications   polyethylene glycol 17 g packet Commonly known as: MIRALAX / GLYCOLAX     TAKE these medications   Accu-Chek Softclix Lancets lancets USE AS DIRECTED TO TEST BLOOD SUGAR FOUR TIMES DAILY   albuterol 108 (90 Base) MCG/ACT inhaler Commonly known as: VENTOLIN HFA Inhale 2 puffs into the lungs every 6 (six) hours as needed for wheezing or shortness of breath.   Anoro Ellipta 62.5-25 MCG/INH Aepb Generic drug: umeclidinium-vilanterol Inhale 1 puff into the lungs daily.   atorvastatin 40 MG tablet Commonly known as: LIPITOR TAKE 1 TABLET BY MOUTH DAILY AT 6 PM What changed: See the new instructions.   blood glucose meter kit and supplies Dispense based on patient and insurance preference. Use up to four times daily as directed. (FOR ICD-10 E10.9, E11.9).   EX-LAX PO Take 1 tablet by mouth daily as needed (constipation).   ferrous sulfate 325 (65 FE) MG EC tablet Take 1 tablet (325 mg total) by mouth daily with breakfast.   gabapentin 100 MG capsule Commonly known as: NEURONTIN TAKE 1 CAPSULE(100 MG) BY MOUTH TWICE DAILY What changed: See the new instructions.   glimepiride 2 MG tablet Commonly known as: AMARYL Take 2 mg by mouth daily.   metFORMIN 850 MG tablet Commonly known as: GLUCOPHAGE TAKE 1 TABLET(850 MG) BY MOUTH TWICE DAILY WITH A MEAL What changed: See the new instructions.   nitroGLYCERIN 0.4 MG SL tablet Commonly known as: NITROSTAT Place 1 tablet (0.4 mg total) under the tongue every 5 (five) minutes as needed for chest pain.   pantoprazole 40 MG tablet Commonly known as: PROTONIX Take 1 tablet (40 mg total) by mouth daily.   potassium chloride SA 20 MEQ tablet Commonly known as:  KLOR-CON Take 1 tablet (20 mEq total) by mouth daily.   spironolactone 25 MG tablet Commonly known as: ALDACTONE Take 1 tablet (25 mg total) by mouth daily.   torsemide 20 MG tablet Commonly known as: DEMADEX Take 2 tablets (40 mg total) by mouth 2 (two) times daily. What changed:   how much to take  how to take this  when to take this  additional instructions       Follow-up Information    John Koch, MD. Go on 08/18/2020.   Specialty: Internal Medicine Why: Please attend your hospital follow up appointment with Dr [name] on Tuesday, 08/18/20, at 3:00pm.  Contact information: [place] [address] [phone]        Home, [facility] Follow up.   Specialty: Home Health Services Why: [facility] will not be able to initiate services until the week after Christmas.  They will call you.  Contact information: [address]  [place] [address] [place] [facility] Follow up.   Specialty: Cardiology Why: Please come by our [facility] office on Tuesday 08/25/2020 for a lab visit so that we can recheck your kidney function and electrolytes. You can come by anytime from 7:30am to 4:30pm. Contact information: [address], [address] [place] [phone]       [name] T, PA-C Follow up.   Specialties: Cardiology, Physician Assistant Why: Hospital follow-up with Cardiology scheduled for 09/09/2019 at 8:45am. Please arrive 15 minutes early for check-in. If this date/time does not work for you, please call our office to reschedule. Contact information: [address] [place] [address] [phone]        Gastroenterology, [place]. Schedule an appointment as soon as possible for a visit in 1 month(s).   Contact information: [place]  [address] [phone]              No Known  Allergies  Consultations:  Cardiology  GI  Palliative Care   Procedures/Studies: DG Chest 2 View  Result Date: 08/09/2020 CLINICAL DATA:  Shortness of breath with exertion. EXAM: CHEST - 2 VIEW COMPARISON:  04/15/2020 FINDINGS: Median sternotomy and prior CABG. Stable cardiomegaly. Unchanged mediastinal contours with aortic atherosclerosis. Pulmonary edema with slight increase from prior exam. Minimal blunting of the costophrenic angles consistent with small effusions. There is fluid in the fissures. No confluent consolidation. No pneumothorax. No acute osseous abnormalities are seen. IMPRESSION: Increased pulmonary edema from prior exam with small pleural effusions. Stable cardiomegaly. Aortic Atherosclerosis (ICD10-I70.0). Electronically Signed   By: Keith Rake M.D.   On: 08/09/2020 17:24   DG CHEST PORT 1 VIEW  Result Date: 08/16/2020 CLINICAL DATA:  Shortness of breath. Congestive heart failure. Coronary artery disease. EXAM: PORTABLE CHEST 1 VIEW COMPARISON:  08/15/2020 FINDINGS: Stable cardiomegaly. Prior CABG again noted. Mild improvement in diffuse interstitial infiltrates is seen, consistent with decreased interstitial edema. No evidence of pulmonary consolidation. IMPRESSION: Mild decrease in diffuse interstitial infiltrates/edema. Stable cardiomegaly. Electronically Signed   By: Marlaine Hind M.D.   On: 08/16/2020 10:42   DG CHEST PORT 1 VIEW  Result Date: 08/15/2020 CLINICAL DATA:  Shortness of breath. Vascular congestion. Mild edema. EXAM: PORTABLE CHEST 1 VIEW COMPARISON:  August 14, 2020 FINDINGS: Cardiomegaly.  Mild edema persists.  No other changes. IMPRESSION: Cardiomegaly and mild persistent edema. Electronically Signed   By: Dorise Bullion III M.D   On: 08/15/2020 09:53   DG CHEST PORT 1 VIEW  Result Date: 08/14/2020 CLINICAL DATA:  Dyspnea EXAM: PORTABLE CHEST 1 VIEW COMPARISON:  08/09/2020 FINDINGS: Cardiac shadow remains enlarged. Aortic calcifications are  again seen. Persistent vascular congestion is noted but significantly improved when compared with the prior exam. Mild edema remains as well. Small left pleural effusion is seen. No focal infiltrate is noted. IMPRESSION: Improved CHF when compare with the prior exam. Electronically Signed   By: Inez Catalina M.D.   On: 08/14/2020 08:23   US Abdomen Limited RUQ (LIVER/GB)  Result Date: 08/10/2020 CLINICAL DATA:  Cirrhosis, ascites search EXAM: LIMITED ABDOMEN ULTRASOUND FOR ASCITES TECHNIQUE: Limited ultrasound survey for ascites was performed in all four abdominal quadrants. COMPARISON:  MR abdomen, 05/13/2020 FINDINGS: No evidence of ascites.  Small right pleural effusion. Cirrhotic morphology of the liver. There is a hyperechoic subcapsular mass of the right lobe of the liver measuring approximately 3.3 x 2.9  x 2.5 cm, as seen and better characterized by prior MR. IMPRESSION: 1. No evidence of ascites. 2. Small right pleural effusion. 3. Cirrhotic morphology of the liver. Hyperechoic subcapsular mass of the right lobe of the liver, better characterized as hepatocellular carcinoma by prior MR. Electronically Signed   By: Eddie Candle M.D.   On: 08/10/2020 12:32       Subjective: Feels well.  No dyspnea with exertion.  Swelling seems at baseline to him.  No fever, sputum.  Has a cough, worse with lying down.   Discharge Exam: Vitals:   08/18/20 0500 08/18/20 0727  BP: (!) 110/52   Pulse: 64   Resp: 16   Temp: 97.9 F (36.6 C)   SpO2: 100% 99%   Vitals:   08/17/20 1945 08/17/20 2300 08/18/20 0500 08/18/20 0727  BP: (!) 127/49 (!) 116/56 (!) 110/52   Pulse: 64 65 64   Resp: '18 16 16   ' Temp: 97.8 F (36.6 C) 97.8 F (36.6 C) 97.9 F (36.6 C)   TempSrc: Oral Oral Oral   SpO2: 100% 100% 100% 99%  Weight:    107.5 kg  Height:        General: Pt is alert, awake, not in acute distress, sitting on edge of bed. Cardiovascular: RRR, nl S1-S2, no murmurs appreciated.   No LE edema.    Respiratory: Normal respiratory rate and rhythm.  CTAB without rales or wheezes. Abdominal: Abdomen soft and non-tender.  No distension or HSM.   Neuro/Psych: Strength symmetric in upper and lower extremities.  Judgment and insight appear normal.   The results of significant diagnostics from this hospitalization (including imaging, microbiology, ancillary and laboratory) are listed below for reference.     Microbiology: Recent Results (from the past 240 hour(s))  Resp Panel by RT-PCR (Flu A&B, Covid) Nasopharyngeal Swab     Status: None   Collection Time: 08/09/20  7:47 PM   Specimen: Nasopharyngeal Swab; Nasopharyngeal(NP) swabs in vial transport medium  Result Value Ref Range Status   SARS Coronavirus 2 by RT PCR NEGATIVE NEGATIVE Final    Comment: (NOTE) SARS-CoV-2 target nucleic acids are NOT DETECTED.  The SARS-CoV-2 RNA is generally detectable in upper respiratory specimens during the acute phase of infection. The lowest concentration of SARS-CoV-2 viral copies this assay can detect is 138 copies/mL. A negative result does not preclude SARS-Cov-2 infection and should not be used as the sole basis for treatment or other patient management decisions. A negative result may occur with  improper specimen collection/handling, submission of specimen other than nasopharyngeal swab, presence of viral mutation(s) within the areas targeted by this assay, and inadequate number of viral copies(<138 copies/mL). A negative result must be combined with clinical observations, patient history, and epidemiological information. The expected result is Negative.  Fact Sheet for Patients:  EntrepreneurPulse.com.au  Fact Sheet for Healthcare Providers:  IncredibleEmployment.be  This test is no t yet approved or cleared by the [place] FDA and  has been authorized for detection and/or diagnosis of SARS-CoV-2 by FDA under an Emergency Use Authorization  (EUA). This EUA will remain  in effect (meaning this test can be used) for the duration of the COVID-19 declaration under Section 564(b)(1) of the Act, 21 U.S.C.section 360bbb-3(b)(1), unless the authorization is terminated  or revoked sooner.       Influenza A by PCR NEGATIVE NEGATIVE Final   Influenza B by PCR NEGATIVE NEGATIVE Final    Comment: (NOTE) The Xpert Xpress SARS-CoV-2/FLU/RSV plus assay is  intended as an aid in the diagnosis of influenza from Nasopharyngeal swab specimens and should not be used as a sole basis for treatment. Nasal washings and aspirates are unacceptable for Xpert Xpress SARS-CoV-2/FLU/RSV testing.  Fact Sheet for Patients: EntrepreneurPulse.com.au  Fact Sheet for Healthcare Providers: IncredibleEmployment.be  This test is not yet approved or cleared by the [place] FDA and has been authorized for detection and/or diagnosis of SARS-CoV-2 by FDA under an Emergency Use Authorization (EUA). This EUA will remain in effect (meaning this test can be used) for the duration of the COVID-19 declaration under Section 564(b)(1) of the Act, 21 U.S.C. section 360bbb-3(b)(1), unless the authorization is terminated or revoked.  Performed at [facility], [place] [address] [place], [place] [address]      Labs: BNP (last 3 results) Recent Labs    04/15/20 1539 08/09/20 1926 08/14/20 0353  BNP 293.0* 326.3* 749.4*   Basic Metabolic Panel: Recent Labs  Lab 08/13/20 0047 08/14/20 0350 08/15/20 0236 08/16/20 0049 08/17/20 0116 08/18/20 0109  NA 137 135 137 139 138 137  K 4.0 3.7 3.8 3.9 3.5 4.0  CL 102 100 101 100 100 99  CO2 '26 26 28 29 29 28  ' GLUCOSE 134* 122* 146* 160* 128* 114*  BUN 20 19 25* 24* 22 22  CREATININE 1.36* 1.30* 1.21 1.20 1.20 1.17  CALCIUM 8.2* 8.1* 8.4* 8.4* 8.6* 8.5*  MG 2.0 2.0 2.0 2.0  --   --   PHOS 3.3 3.2 2.9 3.1  --   --    Liver Function Tests: Recent Labs  Lab  08/13/20 0047 08/14/20 0350 08/15/20 0236 08/16/20 0049 08/17/20 0116  AST '20 18 20 18 22  ' ALT '15 14 15 15 15  ' ALKPHOS 98 93 107 94 116  BILITOT 1.6* 1.3* 0.9 0.7 0.7  PROT 6.5 6.0* 6.4* 6.0* 6.5  ALBUMIN 3.3* 3.1* 3.2* 3.0* 3.2*   No results for input(s): LIPASE, AMYLASE in the last 168 hours. No results for input(s): AMMONIA in the last 168 hours. CBC: Recent Labs  Lab 08/12/20 0150 08/13/20 0047 08/14/20 0350 08/15/20 0236 08/16/20 0049 08/17/20 0116 08/18/20 0109  WBC 4.9 4.5 3.8* 3.5* 2.9* 3.0* 2.7*  NEUTROABS 4.0 3.7 2.9 2.6 2.2  --   --   HGB 7.6* 8.2* 7.5* 7.6* 7.3* 7.6* 7.4*  HCT 25.5* 28.9* 26.4* 27.0* 25.7* 27.0* 25.1*  MCV 80.4 81.9 81.0 81.3 80.8 80.6 81.2  PLT 124* 100* 111* 115* 104* 106* 92*   Cardiac Enzymes: No results for input(s): CKTOTAL, CKMB, CKMBINDEX, TROPONINI in the last 168 hours. BNP: Invalid input(s): POCBNP CBG: Recent Labs  Lab 08/17/20 1153 08/17/20 1542 08/17/20 2122 08/18/20 0718 08/18/20 1138  GLUCAP 157* 140* 279* 91 172*   D-Dimer No results for input(s): DDIMER in the last 72 hours. Hgb A1c No results for input(s): HGBA1C in the last 72 hours. Lipid Profile No results for input(s): CHOL, HDL, LDLCALC, TRIG, CHOLHDL, LDLDIRECT in the last 72 hours. Thyroid function studies No results for input(s): TSH, T4TOTAL, T3FREE, THYROIDAB in the last 72 hours.  Invalid input(s): FREET3 Anemia work up No results for input(s): VITAMINB12, FOLATE, FERRITIN, TIBC, IRON, RETICCTPCT in the last 72 hours. Urinalysis    Component Value Date/Time   COLORURINE YELLOW 05/06/2020 [place] 05/06/2020 0954   LABSPEC 1.009 05/06/2020 [place] 7.0 05/06/2020 [place] 05/06/2020 0954   HGBUR NEGATIVE 05/06/2020 [place] 07/29/2019 1849  KETONESUR NEGATIVE 05/06/2020 0954   PROTEINUR NEGATIVE 05/06/2020 0954   UROBILINOGEN 0.2 03/29/2014 1546   NITRITE NEGATIVE 05/06/2020 0954    LEUKOCYTESUR NEGATIVE 05/06/2020 0954   Sepsis Labs Invalid input(s): PROCALCITONIN,  WBC,  LACTICIDVEN Microbiology Recent Results (from the past 240 hour(s))  Resp Panel by RT-PCR (Flu A&B, Covid) Nasopharyngeal Swab     Status: None   Collection Time: 08/09/20  7:47 PM   Specimen: Nasopharyngeal Swab; Nasopharyngeal(NP) swabs in vial transport medium  Result Value Ref Range Status   SARS Coronavirus 2 by RT PCR NEGATIVE NEGATIVE Final    Comment: (NOTE) SARS-CoV-2 target nucleic acids are NOT DETECTED.  The SARS-CoV-2 RNA is generally detectable in upper respiratory specimens during the acute phase of infection. The lowest concentration of SARS-CoV-2 viral copies this assay can detect is 138 copies/mL. A negative result does not preclude SARS-Cov-2 infection and should not be used as the sole basis for treatment or other patient management decisions. A negative result may occur with  improper specimen collection/handling, submission of specimen other than nasopharyngeal swab, presence of viral mutation(s) within the areas targeted by this assay, and inadequate number of viral copies(<138 copies/mL). A negative result must be combined with clinical observations, patient history, and epidemiological information. The expected result is Negative.  Fact Sheet for Patients:  EntrepreneurPulse.com.au  Fact Sheet for Healthcare Providers:  IncredibleEmployment.be  This test is no t yet approved or cleared by the [place] FDA and  has been authorized for detection and/or diagnosis of SARS-CoV-2 by FDA under an Emergency Use Authorization (EUA). This EUA will remain  in effect (meaning this test can be used) for the duration of the COVID-19 declaration under Section 564(b)(1) of the Act, 21 U.S.C.section 360bbb-3(b)(1), unless the authorization is terminated  or revoked sooner.       Influenza A by PCR NEGATIVE NEGATIVE Final   Influenza  B by PCR NEGATIVE NEGATIVE Final    Comment: (NOTE) The Xpert Xpress SARS-CoV-2/FLU/RSV plus assay is intended as an aid in the diagnosis of influenza from Nasopharyngeal swab specimens and should not be used as a sole basis for treatment. Nasal washings and aspirates are unacceptable for Xpert Xpress SARS-CoV-2/FLU/RSV testing.  Fact Sheet for Patients: EntrepreneurPulse.com.au  Fact Sheet for Healthcare Providers: IncredibleEmployment.be  This test is not yet approved or cleared by the [place] FDA and has been authorized for detection and/or diagnosis of SARS-CoV-2 by FDA under an Emergency Use Authorization (EUA). This EUA will remain in effect (meaning this test can be used) for the duration of the COVID-19 declaration under Section 564(b)(1) of the Act, 21 U.S.C. section 360bbb-3(b)(1), unless the authorization is terminated or revoked.  Performed at [facility], [place] [address], [place], [place] [address]      Time coordinating discharge: 40 minutes The [place] controlled substances registry was reviewed for this patient        SIGNED:   Edwin Dada, MD  [facility] 08/18/2020, 1:39 PM

## 2020-08-18 NOTE — Progress Notes (Signed)
Ordered BMET in 1 week per Dr. Acie Fredrickson. Please see his rounding note from today for more information.  Darreld Mclean, PA-C 08/18/2020 10:33 AM

## 2020-08-18 NOTE — Plan of Care (Signed)

## 2020-08-18 NOTE — Plan of Care (Signed)
Problem: Education: Goal: Knowledge of General Education information will improve Description: Including pain rating scale, medication(s)/side effects and non-pharmacologic comfort measures 08/18/2020 1311 by Jeanann Lewandowsky D, LPN Outcome: Adequate for Discharge 08/18/2020 1026 by Jeanann Lewandowsky D, LPN Outcome: Progressing   Problem: Health Behavior/Discharge Planning: Goal: Ability to manage health-related needs will improve 08/18/2020 1311 by Jeanann Lewandowsky D, LPN Outcome: Adequate for Discharge 08/18/2020 1026 by Jeanann Lewandowsky D, LPN Outcome: Progressing   Problem: Clinical Measurements: Goal: Ability to maintain clinical measurements within normal limits will improve 08/18/2020 1311 by Jeanann Lewandowsky D, LPN Outcome: Adequate for Discharge 08/18/2020 1026 by Jeanann Lewandowsky D, LPN Outcome: Progressing Goal: Will remain free from infection 08/18/2020 1311 by Jeanann Lewandowsky D, LPN Outcome: Adequate for Discharge 08/18/2020 1026 by Jeanann Lewandowsky D, LPN Outcome: Progressing Goal: Diagnostic test results will improve 08/18/2020 1311 by Jeanann Lewandowsky D, LPN Outcome: Adequate for Discharge 08/18/2020 1026 by Jeanann Lewandowsky D, LPN Outcome: Progressing Goal: Respiratory complications will improve 08/18/2020 1311 by Jeanann Lewandowsky D, LPN Outcome: Adequate for Discharge 08/18/2020 1026 by Jeanann Lewandowsky D, LPN Outcome: Progressing Goal: Cardiovascular complication will be avoided 08/18/2020 1311 by Jeanann Lewandowsky D, LPN Outcome: Adequate for Discharge 08/18/2020 1026 by Jeanann Lewandowsky D, LPN Outcome: Progressing   Problem: Activity: Goal: Risk for activity intolerance will decrease 08/18/2020 1311 by Jeanann Lewandowsky D, LPN Outcome: Adequate for Discharge 08/18/2020 1026 by Jeanann Lewandowsky D, LPN Outcome: Progressing   Problem: Nutrition: Goal: Adequate nutrition will be maintained 08/18/2020 1311 by Jeanann Lewandowsky D, LPN Outcome: Adequate for Discharge 08/18/2020 1026 by Jeanann Lewandowsky D, LPN Outcome: Progressing    Problem: Coping: Goal: Level of anxiety will decrease 08/18/2020 1311 by Jeanann Lewandowsky D, LPN Outcome: Adequate for Discharge 08/18/2020 1026 by Jeanann Lewandowsky D, LPN Outcome: Progressing   Problem: Elimination: Goal: Will not experience complications related to bowel motility 08/18/2020 1311 by Jeanann Lewandowsky D, LPN Outcome: Adequate for Discharge 08/18/2020 1026 by Jeanann Lewandowsky D, LPN Outcome: Progressing Goal: Will not experience complications related to urinary retention 08/18/2020 1311 by Jeanann Lewandowsky D, LPN Outcome: Adequate for Discharge 08/18/2020 1026 by Jeanann Lewandowsky D, LPN Outcome: Progressing   Problem: Pain Managment: Goal: General experience of comfort will improve 08/18/2020 1311 by Jeanann Lewandowsky D, LPN Outcome: Adequate for Discharge 08/18/2020 1026 by Jeanann Lewandowsky D, LPN Outcome: Progressing   Problem: Safety: Goal: Ability to remain free from injury will improve 08/18/2020 1311 by Jeanann Lewandowsky D, LPN Outcome: Adequate for Discharge 08/18/2020 1026 by Jeanann Lewandowsky D, LPN Outcome: Progressing   Problem: Skin Integrity: Goal: Risk for impaired skin integrity will decrease 08/18/2020 1311 by Jeanann Lewandowsky D, LPN Outcome: Adequate for Discharge 08/18/2020 1026 by Jeanann Lewandowsky D, LPN Outcome: Progressing   Problem: Education: Goal: Ability to demonstrate management of disease process will improve 08/18/2020 1311 by Jeanann Lewandowsky D, LPN Outcome: Adequate for Discharge 08/18/2020 1026 by Jeanann Lewandowsky D, LPN Outcome: Progressing Goal: Ability to verbalize understanding of medication therapies will improve 08/18/2020 1311 by Jeanann Lewandowsky D, LPN Outcome: Adequate for Discharge 08/18/2020 1026 by Jeanann Lewandowsky D, LPN Outcome: Progressing Goal: Individualized Educational Video(s) 08/18/2020 1311 by Jeanann Lewandowsky D, LPN Outcome: Adequate for Discharge 08/18/2020 1026 by Jeanann Lewandowsky D, LPN Outcome: Progressing   Problem: Activity: Goal: Capacity to carry out activities will  improve 08/18/2020 1311 by Jeanann Lewandowsky D, LPN Outcome: Adequate for Discharge 08/18/2020 1026 by Jeanann Lewandowsky D, LPN Outcome: Progressing   Problem: Cardiac: Goal: Ability to achieve and maintain adequate cardiopulmonary perfusion  will improve 08/18/2020 1311 by Jeanann Lewandowsky D, LPN Outcome: Adequate for Discharge 08/18/2020 1026 by Jeanann Lewandowsky D, LPN Outcome: Progressing

## 2020-08-18 NOTE — Progress Notes (Signed)
AVS paperwork reviewed with pt. All questions answered.

## 2020-08-18 NOTE — Progress Notes (Signed)
CSW notified Helene Kelp with [facility] that pt is discharging today. She will schedule [facility] PT/OT

## 2020-08-25 ENCOUNTER — Ambulatory Visit: Payer: Medicare HMO | Admitting: Podiatry

## 2020-08-25 ENCOUNTER — Other Ambulatory Visit: Payer: Medicare HMO | Admitting: *Deleted

## 2020-08-25 ENCOUNTER — Other Ambulatory Visit: Payer: Self-pay

## 2020-08-25 ENCOUNTER — Encounter: Payer: Self-pay | Admitting: Podiatry

## 2020-08-25 DIAGNOSIS — E1142 Type 2 diabetes mellitus with diabetic polyneuropathy: Secondary | ICD-10-CM

## 2020-08-25 DIAGNOSIS — Z79899 Other long term (current) drug therapy: Secondary | ICD-10-CM | POA: Diagnosis not present

## 2020-08-25 DIAGNOSIS — M79675 Pain in left toe(s): Secondary | ICD-10-CM | POA: Diagnosis not present

## 2020-08-25 DIAGNOSIS — Q828 Other specified congenital malformations of skin: Secondary | ICD-10-CM

## 2020-08-25 DIAGNOSIS — M79674 Pain in right toe(s): Secondary | ICD-10-CM | POA: Diagnosis not present

## 2020-08-25 DIAGNOSIS — L84 Corns and callosities: Secondary | ICD-10-CM

## 2020-08-25 DIAGNOSIS — I5033 Acute on chronic diastolic (congestive) heart failure: Secondary | ICD-10-CM | POA: Diagnosis not present

## 2020-08-25 DIAGNOSIS — B351 Tinea unguium: Secondary | ICD-10-CM | POA: Diagnosis not present

## 2020-08-26 ENCOUNTER — Telehealth: Payer: Self-pay | Admitting: Internal Medicine

## 2020-08-26 DIAGNOSIS — K766 Portal hypertension: Secondary | ICD-10-CM | POA: Diagnosis not present

## 2020-08-26 DIAGNOSIS — N182 Chronic kidney disease, stage 2 (mild): Secondary | ICD-10-CM | POA: Diagnosis not present

## 2020-08-26 DIAGNOSIS — E1142 Type 2 diabetes mellitus with diabetic polyneuropathy: Secondary | ICD-10-CM | POA: Diagnosis not present

## 2020-08-26 DIAGNOSIS — D631 Anemia in chronic kidney disease: Secondary | ICD-10-CM | POA: Diagnosis not present

## 2020-08-26 DIAGNOSIS — E1122 Type 2 diabetes mellitus with diabetic chronic kidney disease: Secondary | ICD-10-CM | POA: Diagnosis not present

## 2020-08-26 DIAGNOSIS — R69 Illness, unspecified: Secondary | ICD-10-CM | POA: Diagnosis not present

## 2020-08-26 DIAGNOSIS — I5033 Acute on chronic diastolic (congestive) heart failure: Secondary | ICD-10-CM | POA: Diagnosis not present

## 2020-08-26 DIAGNOSIS — K3189 Other diseases of stomach and duodenum: Secondary | ICD-10-CM | POA: Diagnosis not present

## 2020-08-26 DIAGNOSIS — C229 Malignant neoplasm of liver, not specified as primary or secondary: Secondary | ICD-10-CM | POA: Diagnosis not present

## 2020-08-26 DIAGNOSIS — I130 Hypertensive heart and chronic kidney disease with heart failure and stage 1 through stage 4 chronic kidney disease, or unspecified chronic kidney disease: Secondary | ICD-10-CM | POA: Diagnosis not present

## 2020-08-26 DIAGNOSIS — I5030 Unspecified diastolic (congestive) heart failure: Secondary | ICD-10-CM | POA: Diagnosis not present

## 2020-08-26 LAB — BASIC METABOLIC PANEL
BUN/Creatinine Ratio: 19 (ref 10–24)
BUN: 26 mg/dL (ref 8–27)
CO2: 24 mmol/L (ref 20–29)
Calcium: 8.7 mg/dL (ref 8.6–10.2)
Chloride: 102 mmol/L (ref 96–106)
Creatinine, Ser: 1.35 mg/dL — ABNORMAL HIGH (ref 0.76–1.27)
GFR calc Af Amer: 59 mL/min/{1.73_m2} — ABNORMAL LOW (ref >59)
GFR calc non Af Amer: 51 mL/min/{1.73_m2} — ABNORMAL LOW (ref >59)
Glucose: 209 mg/dL — ABNORMAL HIGH (ref 65–99)
Potassium: 3.8 mmol/L (ref 3.5–5.2)
Sodium: 139 mmol/L (ref 134–144)

## 2020-08-26 NOTE — Telephone Encounter (Signed)
    [place] ORDERS   Caller Name:ERIK \\Home  [place] Phone #: [phone] Service Requested: PT (examples: OT/PT/Skilled Nursing/Social Work/Speech Therapy/Wound Care) Frequency of Visits: 1W4

## 2020-08-27 DIAGNOSIS — D631 Anemia in chronic kidney disease: Secondary | ICD-10-CM | POA: Diagnosis not present

## 2020-08-27 DIAGNOSIS — I5033 Acute on chronic diastolic (congestive) heart failure: Secondary | ICD-10-CM | POA: Diagnosis not present

## 2020-08-27 DIAGNOSIS — C229 Malignant neoplasm of liver, not specified as primary or secondary: Secondary | ICD-10-CM | POA: Diagnosis not present

## 2020-08-27 DIAGNOSIS — K3189 Other diseases of stomach and duodenum: Secondary | ICD-10-CM | POA: Diagnosis not present

## 2020-08-27 DIAGNOSIS — E1122 Type 2 diabetes mellitus with diabetic chronic kidney disease: Secondary | ICD-10-CM | POA: Diagnosis not present

## 2020-08-27 DIAGNOSIS — E1142 Type 2 diabetes mellitus with diabetic polyneuropathy: Secondary | ICD-10-CM | POA: Diagnosis not present

## 2020-08-27 DIAGNOSIS — I130 Hypertensive heart and chronic kidney disease with heart failure and stage 1 through stage 4 chronic kidney disease, or unspecified chronic kidney disease: Secondary | ICD-10-CM | POA: Diagnosis not present

## 2020-08-27 DIAGNOSIS — N182 Chronic kidney disease, stage 2 (mild): Secondary | ICD-10-CM | POA: Diagnosis not present

## 2020-08-27 DIAGNOSIS — R69 Illness, unspecified: Secondary | ICD-10-CM | POA: Diagnosis not present

## 2020-08-27 DIAGNOSIS — K766 Portal hypertension: Secondary | ICD-10-CM | POA: Diagnosis not present

## 2020-08-27 NOTE — Telephone Encounter (Signed)
Would need hospital follow up prior to verbals.

## 2020-08-27 NOTE — Telephone Encounter (Signed)
Left detailed message informing [name] of below. I called pt  hospital f/u scheduled for 09/03/20 @ 2:40.

## 2020-08-28 ENCOUNTER — Encounter: Payer: Self-pay | Admitting: Podiatry

## 2020-08-28 NOTE — Progress Notes (Signed)
Subjective:  Patient ID: [name], male    DOB: 1945/11/11,  MRN: [id number]  74 y.o. male presents with at risk foot care with history of diabetic neuropathy and painful porokeratotic lesion(s) right foot and painful mycotic toenails that limit ambulation. Painful toenails interfere with ambulation. Aggravating factors include wearing enclosed shoe gear. Pain is relieved with periodic professional debridement. Painful porokeratotic lesions are aggravated when weightbearing with and without shoegear. Pain is relieved with periodic professional debridement.Marland Kitchen    PCP: [name], MD and last visit was: 03/12/2020.  He states he has been hospitalized since his last visit.  Patient states he has not checked his blood glucose lately.  Review of Systems: Negative except as noted in the HPI.  Past Medical History:  Diagnosis Date  . Arthritis    "joints tighten up sometimes" (03/27/2104)  . Carcinoma of liver, hepatocellular ([place]) 06/30/2020  . CHF (congestive heart failure) ([place])   . Chronic lower back pain   . Coronary artery disease    a. 05/2012 Cath/PCI: LM 40ost, 50-60d, LAD 99p ruptured plaque (3.0x28 DES), LCX 50p/m, RCA 30-40p, 33m EF 65-70%.  . Coronary artery disease involving native coronary artery of native heart without angina pectoris    Severe left main disease at catheterization July 2015  CABG x3 with a LIMA to the LAD, SVG to the OM, SVG to the PDA on 03/31/14. EF 60% by cath.   . Diastolic heart failure [place]   . GERD without esophagitis 08/04/2010  . Hepatic cirrhosis ([place]    a. Dx 01/2014 - CT a/p   . History of blood transfusion    "related to bleeding ulcers"  . History of concussion    1976--  NO RESIDUAL  . History of GI bleed    a. UGIB 07/2012;  b. 01/2014 admission with GIB/FOB stool req 1U prbc's->EGD showed portal gastropathy, barrett's esoph, and chronic active h. pylori gastritis.  .Marland KitchenHistory of gout    2007 &  2008  LEFT LEG-- NO ISSUE SINCE  .  Hyperlipidemia   . Iron deficiency anemia   . Kidney stones   . OA (osteoarthritis of spine)    LOWER BACK--  INTERMITTANT LEFT LEG NUMBNESS  . OSA (obstructive sleep apnea)    PULMOLOGIST-  DR CLANCE--  MODERATE OSA  STARTED CPAP 2012--  BUT CURRENTLY HAS NOT USED PAST 6 MONTHS  . Phimosis    a. s/p circumcision 2015.  . Type 2 diabetes mellitus ([place]   . Unspecified essential hypertension    Past Surgical History:  Procedure Laterality Date  . ANKLE FRACTURE SURGERY Right 1989   "plate put in"  . APPENDECTOMY  05-16-2004   open  . CIRCUMCISION N/A 09/09/2013   Procedure: CIRCUMCISION ADULT;  Surgeon: DBernestine Amass MD;  [facility]  Service: Urology;  Laterality: N/A;  . COLECTOMY  05-16-2004  . CORONARY ANGIOPLASTY WITH STENT PLACEMENT  06/28/2012  DR COOPER   PCI W/  X1 DES to [place] LAD/  LM  40% OSTIAL & 50-60% DISTAL /  50% PROX LCX/  30-40% PROX RCA & 50% MID RCA/   LVEF 65-70%  . CORONARY ARTERY BYPASS GRAFT N/A 03/31/2014   Procedure: CORONARY ARTERY BYPASS GRAFTING (CABG) times 3 using left internal mammary artery and right saphenous vein.;  Surgeon: SMelrose Nakayama MD;  [place]  Service: Open Heart Surgery;  Laterality: N/A;  . DOBUTAMINE STRESS ECHO  06-08-2012   MODERATE HYPOKINESIS/ ISCHEMIA  MID INFERIOR WALL  . ESOPHAGOGASTRODUODENOSCOPY  08/15/2012   Procedure: ESOPHAGOGASTRODUODENOSCOPY (EGD);  Surgeon: Wonda Horner, MD;  Location[facility] END[facility]COPY;  Service: Endoscopy;  Laterality: N/A;  . ESOPHAGOGASTRODUODENOSCOPY N/A 02/17/2014   Procedure: ESOPHAGOGASTRODUODENOSCOPY (EGD);  Surgeon: Jeryl Columbia, MD;  Location: [facility] ENDOSCOPY;  Service: Endoscopy;  Laterality: N/A;  . ESOPHAGOGASTRODUODENOSCOPY N/A 04/17/2020   Procedure: ESOPHAGOGASTRODUODENOSCOPY (EGD);  Surgeon: Ronnette Juniper, MD;  Location: [place];  Service: Gastroenterology;  Laterality: N/A;  . ESOPHAGOGASTRODUODENOSCOPY (EGD) WITH PROPOFOL N/A 09/30/2017   Procedure:  ESOPHAGOGASTRODUODENOSCOPY (EGD) WITH PROPOFOL;  Surgeon: Wonda Horner, MD;  Location: [facility] ENDOSCOPY;  Service: Endoscopy;  Laterality: N/A;  . ESOPHAGOGASTRODUODENOSCOPY (EGD) WITH PROPOFOL N/A 12/20/2017   Procedure: ESOPHAGOGASTRODUODENOSCOPY (EGD) WITH PROPOFOL;  Surgeon: Clarene Essex, MD;  Location: [place];  Service: Endoscopy;  Laterality: N/A;  . ESOPHAGOGASTRODUODENOSCOPY (EGD) WITH PROPOFOL N/A 08/10/2020   Procedure: ESOPHAGOGASTRODUODENOSCOPY (EGD) WITH PROPOFOL;  Surgeon: Wilford Corner, MD;  Location: [place];  Service: Endoscopy;  Laterality: N/A;  . HERNIA REPAIR    . INTRAOPERATIVE TRANSESOPHAGEAL ECHOCARDIOGRAM N/A 03/31/2014   Procedure: INTRAOPERATIVE TRANSESOPHAGEAL ECHOCARDIOGRAM;  Surgeon: Melrose Nakayama, MD;  Location: [place];  Service: Open Heart Surgery;  Laterality: N/A;  . IR ANGIOGRAM SELECTIVE EACH ADDITIONAL VESSEL  06/16/2020  . IR ANGIOGRAM SELECTIVE EACH ADDITIONAL VESSEL  06/16/2020  . IR ANGIOGRAM SELECTIVE EACH ADDITIONAL VESSEL  06/16/2020  . IR ANGIOGRAM SELECTIVE EACH ADDITIONAL VESSEL  06/16/2020  . IR ANGIOGRAM SELECTIVE EACH ADDITIONAL VESSEL  06/16/2020  . IR ANGIOGRAM SELECTIVE EACH ADDITIONAL VESSEL  06/16/2020  . IR ANGIOGRAM SELECTIVE EACH ADDITIONAL VESSEL  06/16/2020  . IR ANGIOGRAM SELECTIVE EACH ADDITIONAL VESSEL  06/16/2020  . IR ANGIOGRAM SELECTIVE EACH ADDITIONAL VESSEL  06/16/2020  . IR ANGIOGRAM SELECTIVE EACH ADDITIONAL VESSEL  06/30/2020  . IR ANGIOGRAM SELECTIVE EACH ADDITIONAL VESSEL  06/30/2020  . IR ANGIOGRAM VISCERAL SELECTIVE  06/16/2020  . IR ANGIOGRAM VISCERAL SELECTIVE  06/30/2020  . IR EMBO ARTERIAL NOT HEMORR HEMANG INC GUIDE ROADMAPPING  06/16/2020  . IR EMBO TUMOR ORGAN ISCHEMIA INFARCT INC GUIDE ROADMAPPING  06/30/2020  . IR RADIOLOGIST EVAL & MGMT  05/06/2020  . IR RADIOLOGIST EVAL & MGMT  05/20/2020  . IR US GUIDE VASC ACCESS RIGHT  06/16/2020  . IR US GUIDE VASC ACCESS RIGHT  06/30/2020  . LAPAROSCOPIC  UMBILICAL HERNIA REPAIR W/ MESH  06-06-2011  . LEFT HEART CATHETERIZATION WITH CORONARY ANGIOGRAM N/A 03/28/2014   Procedure: LEFT HEART CATHETERIZATION WITH CORONARY ANGIOGRAM;  Surgeon: Sinclair Grooms, MD;  [facility] LAB;  Service: Cardiovascular;  Laterality: N/A;  . LIPOMA EXCISION Left 08/25/2017   Procedure: EXCISION LIPOMA LEFT POSTERIOR THIGH;  Surgeon: Clovis Riley, MD;  Location: [place];  Service: General;  Laterality: Left;  . NEPHROLITHOTOMY  1990'S  . OPEN APPENDECTOMY W/ PARTIAL CECECTOMY  05-16-2004  . PERCUTANEOUS CORONARY STENT INTERVENTION (PCI-S) N/A 06/28/2012   Procedure: PERCUTANEOUS CORONARY STENT INTERVENTION (PCI-S);  Surgeon: Sherren Mocha, MD;  Location[facility] CATH LAB;  Service: Cardiovascular;  Laterality: N/A;   Patient Active Problem List   Diagnosis Date Noted  . Hepatocellular carcinoma ([place]) 08/09/2020  . Routine general medical examination at a health care facility 05/06/2020  . Iron deficiency anemia   . Symptomatic anemia 04/15/2020  . Pulmonary hypertension, unspecified ([place]) 03/11/2020  . Asthma, mild intermittent 12/27/2019  . Alcoholic cirrhosis of liver with ascites ([place]) 12/27/2019  . (HFpEF) heart failure with preserved ejection fraction ([place]) 12/24/2018  .  CKD (chronic kidney disease), stage III ([place]) 12/18/2017  . Peptic ulcer disease 12/18/2017  . GI bleed 09/29/2017  . Morbid obesity ([place]) 02/05/2016  . S/P CABG x 3 03/31/2014  . Hepatic cirrhosis ([place]) 02/27/2014  . Hyperlipidemia with target LDL less than 70 06/29/2012  . Thrombocytopenia ([place]) 06/29/2012  . Coronary artery disease involving native coronary artery of native heart without angina pectoris   . OSA (obstructive sleep apnea)   . Type 2 diabetes with complication ([place]) 70/35/0093  . GERD without esophagitis 08/04/2010  . History of gout   . Hypertensive heart disease     Current Outpatient Medications:  .  Accu-Chek Softclix Lancets lancets, USE AS DIRECTED  TO TEST BLOOD SUGAR FOUR TIMES DAILY, Disp: 300 each, Rfl: 3 .  albuterol (VENTOLIN HFA) 108 (90 Base) MCG/ACT inhaler, Inhale 2 puffs into the lungs every 6 (six) hours as needed for wheezing or shortness of breath., Disp: 8 g, Rfl: 3 .  atorvastatin (LIPITOR) 40 MG tablet, TAKE 1 TABLET BY MOUTH DAILY AT 6 PM (Patient taking differently: Take by mouth daily.), Disp: 90 tablet, Rfl: 0 .  blood glucose meter kit and supplies, Dispense based on patient and insurance preference. Use up to four times daily as directed. (FOR ICD-10 E10.9, E11.9)., Disp: 1 each, Rfl: 0 .  ferrous sulfate 325 (65 FE) MG EC tablet, Take 1 tablet (325 mg total) by mouth daily with breakfast., Disp: 90 tablet, Rfl: 3 .  gabapentin (NEURONTIN) 100 MG capsule, TAKE 1 CAPSULE(100 MG) BY MOUTH TWICE DAILY (Patient taking differently: Take 100 mg by mouth 2 (two) times daily.), Disp: 180 capsule, Rfl: 3 .  glimepiride (AMARYL) 2 MG tablet, Take 2 mg by mouth daily., Disp: , Rfl:  .  metFORMIN (GLUCOPHAGE) 850 MG tablet, TAKE 1 TABLET(850 MG) BY MOUTH TWICE DAILY WITH A MEAL (Patient taking differently: Take 850 mg by mouth 2 (two) times daily with a meal.), Disp: 180 tablet, Rfl: 1 .  nitroGLYCERIN (NITROSTAT) 0.4 MG SL tablet, Place 1 tablet (0.4 mg total) under the tongue every 5 (five) minutes as needed for chest pain., Disp: 25 tablet, Rfl: 6 .  pantoprazole (PROTONIX) 40 MG tablet, Take 1 tablet (40 mg total) by mouth daily., Disp: 30 tablet, Rfl: 1 .  potassium chloride (KLOR-CON) 20 MEQ tablet, Take 1 tablet (20 mEq total) by mouth daily., Disp: 30 tablet, Rfl: 3 .  Sennosides (EX-LAX PO), Take 1 tablet by mouth daily as needed (constipation)., Disp: , Rfl:  .  spironolactone (ALDACTONE) 25 MG tablet, Take 1 tablet (25 mg total) by mouth daily., Disp: 30 tablet, Rfl: 2 .  torsemide (DEMADEX) 20 MG tablet, Take 2 tablets (40 mg total) by mouth 2 (two) times daily., Disp: 60 tablet, Rfl: 3 .  umeclidinium-vilanterol (ANORO  ELLIPTA) 62.5-25 MCG/INH AEPB, Inhale 1 puff into the lungs daily., Disp: 30 each, Rfl: 11 No Known Allergies Social History   Tobacco Use  Smoking Status Former Smoker  . Packs/day: 2.00  . Years: 40.00  . Pack years: 80.00  . Types: Cigarettes  . Quit date: 08/29/1996  . Years since quitting: 24.0  Smokeless Tobacco Never Used   Objective:  There were no vitals filed for this visit. Constitutional Patient is a pleasant 74 y.o. Caucasian male morbidly obese in NAD.Marland Kitchen AAO x 3.  Vascular Capillary refill time to digits immediate b/l. Palpable DP pulse(s) b/l lower extremities Nonpalpable PT pulse(s) b/l lower extremities. Pedal hair sparse. Lower extremity skin temperature gradient  within normal limits. Lymphedema present b/l lower extremities. No cyanosis or clubbing noted.  Neurologic Normal speech. Oriented to person, place, and time. Protective sensation diminished with 10g monofilament b/l. Vibratory sensation decreased b/l.  Dermatologic Pedal skin with normal turgor, texture and tone bilaterally. No open wounds bilaterally. No interdigital macerations bilaterally. Toenails 1-5 b/l elongated, discolored, dystrophic, thickened, crumbly with subungual debris and tenderness to dorsal palpation. Hyperkeratotic lesion(s) submet head 5 left foot and submet head 5 right foot.  No erythema, no edema, no drainage, no flocculence.  Orthopedic: Normal muscle strength 5/5 to all lower extremity muscle groups bilaterally. No pain crepitus or joint limitation noted with ROM b/l. Pes planus deformity noted b/l.    Hemoglobin A1C Latest Ref Rng & Units 08/10/2020 05/06/2020 03/12/2020 11/01/2019  HGBA1C 4.8 - 5.6 % 5.3 5.0 5.2 5.0  Some recent data might be hidden   Assessment:   1. Pain due to onychomycosis of toenails of both feet   2. Callus   3. Porokeratosis   4. Diabetic peripheral neuropathy associated with type 2 diabetes mellitus ([place])    Plan:  Patient was evaluated and treated and all  questions answered.  Onychomycosis with pain -Nails palliatively debridement as below. -Educated on self-care  Procedure: Nail Debridement Rationale: Pain Type of Debridement: manual, [place] debridement. Instrumentation: Nail nipper, rotary burr. Number of Nails: 10  -Examined patient. -No new findings. No new orders. -Continue diabetic foot care principles. -Toenails 1-5 b/l were debrided in length and girth with sterile nail nippers and dremel without iatrogenic bleeding.  -Callus(es) submet head 5 left foot pared utilizing sterile scalpel blade without complication or incident. Total number debrided =1. -Painful porokeratotic lesion(s) submet head 5 right foot pared and enucleated with sterile scalpel blade without incident. -Patient to report any pedal injuries to medical professional immediately.  Return in about 3 months (around 11/23/2020).  Marzetta Board, DPM

## 2020-09-03 ENCOUNTER — Inpatient Hospital Stay: Payer: Medicare HMO | Admitting: Internal Medicine

## 2020-09-06 NOTE — Progress Notes (Deleted)
Cardiology Office Note:    Date:  09/06/2020   ID:  [name], DOB 1945-12-19, MRN [id number]  PCP:  [name], MD  [facility] Cardiologist:  Mertie Moores, MD *** [place] Electrophysiologist:  None   Referring MD: [name], *   Chief Complaint:  No chief complaint on file.    Patient Profile:    [name] is a 75 y.o. male with:   Coronary artery disease  ? S/p DES to LAD in 2013 ? S/p CABG 05/2014  HFpEF  ? Echo 2/19: EF 60-65, mod LVH ? Admitted 06/2019, 3/21, 4/21  Diabetes mellitus 2  Hypertension   Hyperlipidemia   Chronic kidney disease   Hx of GI bleed 09/2017, 03/2020  Hepatic cirrhosis ? Thrombocytopenia ? Heavy ETOH use ? Portal HTN, esophageal varices   OSA  Chronic anemia  Hepatocellular CA   S/p chemoembolization in 06/2020   Prior CV studies: Echocardiogram 05/27/20 EF 60-65, no RWMA, Gr 3 DD, normal RVSF, RVSP 36.9, mild LAE, mild to mod RAE, mod MAC, mod AV calcification, trivial AI, mild AS (mean 10 mmHg), mild ascending aorta dilation (42 mm)  Echocardiogram 07/30/2019 EF 55-60, normal RV SF, moderate LAE, trace MR, trivial TR, RVSP 45.5  Echocardiogram 10/02/17 Mod LVH, EF 60-65, no RWMA, mild AI, mild MAC, mod BAE  Pre-CABG Dopplers 03/29/14 Summary:  Bilateral: 1-39% ICA stenosis. Vertebral artery flow is antegrade.  ABI is within normal limits with abnormal Doppler waveforms.   Cardiac catheterization 03/28/14 LM prox 30, dist 70-80 LAD ostial 60-70, patent prox stent LCx ostial 90, mid 80-90 RCA prox to mid 50 EF 60   History of Present Illness:    [name] was last seen by Dr. Acie Fredrickson in 9/21.  He was admitted 12/12-12/21 with decompensated congestive heart failure in the setting of blood loss anemia due to UGI bleed from portal hypertensive gastropathy.  He was managed with IV furosemide and followed by Cardiology.  He was ultimately transitioned to Torsemide.  He returns for f/u.   ***      Past Medical History:  Diagnosis Date  . Arthritis    "joints tighten up sometimes" (03/27/2104)  . Carcinoma of liver, hepatocellular ([place]) 06/30/2020  . CHF (congestive heart failure) ([place])   . Chronic lower back pain   . Coronary artery disease    a. 05/2012 Cath/PCI: LM 40ost, 50-60d, LAD 99p ruptured plaque (3.0x28 DES), LCX 50p/m, RCA 30-40p, 92m EF 65-70%.  . Coronary artery disease involving native coronary artery of native heart without angina pectoris    Severe left main disease at catheterization July 2015  CABG x3 with a LIMA to the LAD, SVG to the OM, SVG to the PDA on 03/31/14. EF 60% by cath.   . Diastolic heart failure [place]   . GERD without esophagitis 08/04/2010  . [place]atic [place]    a. Dx 01/2014 - CT a/p   . History of blood transfusion    "related to bleeding ulcers"  . History of concussion    1976--  NO RESIDUAL  . History of GI bleed    a. UGIB 07/2012;  b. 01/2014 admission with GIB/FOB stool req 1U prbc's->EGD showed portal gastropathy, barrett's esoph, and chronic active h. pylori gastritis.  .Marland KitchenHistory of gout    2007 &  2008  LEFT LEG-- NO ISSUE SINCE  . Hyperlipidemia   . Iron deficiency anemia   . Kidney stones   . OA (osteoarthritis  of spine)    LOWER BACK--  INTERMITTANT LEFT LEG NUMBNESS  . OSA (obstructive sleep apnea)    PULMOLOGIST-  DR CLANCE--  MODERATE OSA  STARTED CPAP 2012--  BUT CURRENTLY HAS NOT USED PAST 6 MONTHS  . Phimosis    a. s/p circumcision 2015.  . Type 2 diabetes mellitus ([place])   . Unspecified essential hypertension     Current Medications: No outpatient medications have been marked as taking for the 09/08/20 encounter (Appointment) with Richardson Dopp T, PA-C.     Allergies:   Patient has no known allergies.   Social History   Tobacco Use  . Smoking status: Former Smoker    Packs/day: 2.00    Years: 40.00    Pack years: 80.00    Types: Cigarettes    Quit date: 08/29/1996    Years since quitting: 24.0   . Smokeless tobacco: Never Used  Vaping Use  . Vaping Use: Never used  Substance Use Topics  . Alcohol use: Yes    Alcohol/week: 8.0 standard drinks    Types: 8 Cans of beer per week    Comment: 2 beers every other day  . Drug use: No     Family Hx: The patient's family history includes Cancer in his brother, father, mother, sister, and sister; Colon cancer in an other family member; Coronary artery disease in an other family member; Diabetes in an other family member; Lung cancer in his sister.  ROS   EKGs/Labs/Other Test Reviewed:    EKG:  EKG is *** ordered today.  The ekg ordered today demonstrates ***  Recent Labs: 12/28/2019: TSH 2.761 08/14/2020: B Natriuretic Peptide 414.2 08/16/2020: Magnesium 2.0 08/17/2020: ALT 15 08/18/2020: Hemoglobin 7.4; Platelets 92 08/25/2020: BUN 26; Creatinine, Ser 1.35; Potassium 3.8; Sodium 139   Recent Lipid Panel Lab Results  Component Value Date/Time   CHOL 150 11/01/2019 10:30 AM   TRIG 69 11/01/2019 10:30 AM   HDL 38 (L) 11/01/2019 10:30 AM   CHOLHDL 3.9 11/01/2019 10:30 AM   LDLCALC 98 11/01/2019 10:30 AM      Risk Assessment/Calculations:   {Does this patient have ATRIAL FIBRILLATION?:[phone]}  Physical Exam:    VS:  There were no vitals taken for this visit.    Wt Readings from Last 3 Encounters:  08/18/20 236 lb 15.9 oz (107.5 kg)  05/11/20 235 lb (106.6 kg)  05/06/20 231 lb (104.8 kg)     Physical Exam ***  ASSESSMENT & PLAN:    *** 1. Chronic heart failure with preserved ejection fraction [facility]) He has had several admissions to the hospital with decompensated diastolic heart failure.  Echocardiogram in December 2020 with EF 55-60.  Volume status is currently stable.  His breathing is overall improved.  He still has significant lower extremity swelling.  Continue current dose of torsemide and spironolactone.  Obtain follow-up BMP today.  I have encouraged him to use Ace wrap for compression to improve his  lower extremity swelling.  Follow-up with Dr. Acie Fredrickson or me in 8 weeks.  2. Coronary artery disease involving native coronary artery of native heart without angina pectoris History of CABG in 2015.  He is not having anginal symptoms.  He is not on aspirin due to bleeding risk.  Continue high intensity statin therapy.  3. Essential hypertension Fair control.  Continue current management.  4. Hepatic cirrhosis, unspecified hepatic cirrhosis type, unspecified whether ascites present [facility]) Continue follow-up with primary care.  5. Stage 3a chronic kidney disease Obtain follow-up BMP  today.  6. Iron deficiency anemia, unspecified iron deficiency anemia type Recent hemoglobin 7.2.  Continue iron and follow-up with primary care.  7. OSA (obstructive sleep apnea) He notes inadequate equipment at home as well as issues with his electrical grid.  Once he has those issues addressed, if he continues to have issues with his machine, I have asked him to contact us.  At that point, I would refer him to Dr. Radford Pax for further evaluation management of his sleep apnea.  {Are you ordering a CV Procedure (e.g. stress test, cath, DCCV, TEE, etc)?   Press F2        :[id number]}    Dispo:  No follow-ups on file.   Medication Adjustments/Labs and Tests Ordered: Current medicines are reviewed at length with the patient today.  Concerns regarding medicines are outlined above.  Tests Ordered: No orders of the defined types were placed in this encounter.  Medication Changes: No orders of the defined types were placed in this encounter.   Signed, Richardson Dopp, PA-C  09/06/2020 8:58 PM    [facility] [place], [place], [place]  [address] Phone: [phone]; Fax: [phone]

## 2020-09-08 ENCOUNTER — Ambulatory Visit: Payer: Medicare HMO | Admitting: Physician Assistant

## 2020-09-08 DIAGNOSIS — I5032 Chronic diastolic (congestive) heart failure: Secondary | ICD-10-CM

## 2020-09-08 DIAGNOSIS — D649 Anemia, unspecified: Secondary | ICD-10-CM

## 2020-09-08 DIAGNOSIS — C220 Liver cell carcinoma: Secondary | ICD-10-CM

## 2020-09-08 DIAGNOSIS — K7030 Alcoholic cirrhosis of liver without ascites: Secondary | ICD-10-CM

## 2020-09-08 DIAGNOSIS — N1831 Chronic kidney disease, stage 3a: Secondary | ICD-10-CM

## 2020-09-08 DIAGNOSIS — I2510 Atherosclerotic heart disease of native coronary artery without angina pectoris: Secondary | ICD-10-CM

## 2020-09-08 DIAGNOSIS — I10 Essential (primary) hypertension: Secondary | ICD-10-CM

## 2020-09-10 ENCOUNTER — Encounter (HOSPITAL_COMMUNITY): Payer: Self-pay | Admitting: Family Medicine

## 2020-09-10 ENCOUNTER — Telehealth: Payer: Self-pay | Admitting: *Deleted

## 2020-09-10 ENCOUNTER — Observation Stay (HOSPITAL_COMMUNITY)
Admission: EM | Admit: 2020-09-10 | Discharge: 2020-09-12 | Disposition: A | Discharge status: Home / Self Care | Payer: Medicare HMO | Attending: Internal Medicine | Admitting: Internal Medicine

## 2020-09-10 ENCOUNTER — Other Ambulatory Visit: Payer: Self-pay

## 2020-09-10 ENCOUNTER — Ambulatory Visit (INDEPENDENT_AMBULATORY_CARE_PROVIDER_SITE_OTHER): Payer: Medicare HMO | Admitting: Internal Medicine

## 2020-09-10 ENCOUNTER — Encounter: Payer: Self-pay | Admitting: Internal Medicine

## 2020-09-10 VITALS — BP 140/60 | HR 86 | Temp 98.1°F | Ht 68.0 in | Wt 233.0 lb

## 2020-09-10 DIAGNOSIS — K3189 Other diseases of stomach and duodenum: Secondary | ICD-10-CM | POA: Insufficient documentation

## 2020-09-10 DIAGNOSIS — Z7984 Long term (current) use of oral hypoglycemic drugs: Secondary | ICD-10-CM | POA: Diagnosis not present

## 2020-09-10 DIAGNOSIS — N182 Chronic kidney disease, stage 2 (mild): Secondary | ICD-10-CM | POA: Diagnosis not present

## 2020-09-10 DIAGNOSIS — K449 Diaphragmatic hernia without obstruction or gangrene: Secondary | ICD-10-CM | POA: Insufficient documentation

## 2020-09-10 DIAGNOSIS — Z79899 Other long term (current) drug therapy: Secondary | ICD-10-CM | POA: Diagnosis not present

## 2020-09-10 DIAGNOSIS — N183 Chronic kidney disease, stage 3 unspecified: Secondary | ICD-10-CM | POA: Insufficient documentation

## 2020-09-10 DIAGNOSIS — K7460 Unspecified cirrhosis of liver: Secondary | ICD-10-CM | POA: Diagnosis present

## 2020-09-10 DIAGNOSIS — D500 Iron deficiency anemia secondary to blood loss (chronic): Secondary | ICD-10-CM | POA: Diagnosis not present

## 2020-09-10 DIAGNOSIS — I5032 Chronic diastolic (congestive) heart failure: Secondary | ICD-10-CM

## 2020-09-10 DIAGNOSIS — I864 Gastric varices: Secondary | ICD-10-CM | POA: Insufficient documentation

## 2020-09-10 DIAGNOSIS — E1122 Type 2 diabetes mellitus with diabetic chronic kidney disease: Secondary | ICD-10-CM | POA: Insufficient documentation

## 2020-09-10 DIAGNOSIS — J4520 Mild intermittent asthma, uncomplicated: Secondary | ICD-10-CM

## 2020-09-10 DIAGNOSIS — I130 Hypertensive heart and chronic kidney disease with heart failure and stage 1 through stage 4 chronic kidney disease, or unspecified chronic kidney disease: Secondary | ICD-10-CM | POA: Diagnosis not present

## 2020-09-10 DIAGNOSIS — D649 Anemia, unspecified: Secondary | ICD-10-CM | POA: Diagnosis present

## 2020-09-10 DIAGNOSIS — J45909 Unspecified asthma, uncomplicated: Secondary | ICD-10-CM | POA: Diagnosis not present

## 2020-09-10 DIAGNOSIS — I25811 Atherosclerosis of native coronary artery of transplanted heart without angina pectoris: Secondary | ICD-10-CM | POA: Diagnosis not present

## 2020-09-10 DIAGNOSIS — K922 Gastrointestinal hemorrhage, unspecified: Secondary | ICD-10-CM | POA: Diagnosis not present

## 2020-09-10 DIAGNOSIS — R69 Illness, unspecified: Secondary | ICD-10-CM | POA: Diagnosis not present

## 2020-09-10 DIAGNOSIS — D61818 Other pancytopenia: Secondary | ICD-10-CM | POA: Diagnosis present

## 2020-09-10 DIAGNOSIS — K7031 Alcoholic cirrhosis of liver with ascites: Secondary | ICD-10-CM | POA: Diagnosis not present

## 2020-09-10 DIAGNOSIS — Z87891 Personal history of nicotine dependence: Secondary | ICD-10-CM | POA: Diagnosis not present

## 2020-09-10 DIAGNOSIS — I2510 Atherosclerotic heart disease of native coronary artery without angina pectoris: Secondary | ICD-10-CM | POA: Diagnosis not present

## 2020-09-10 DIAGNOSIS — Z20822 Contact with and (suspected) exposure to covid-19: Secondary | ICD-10-CM | POA: Insufficient documentation

## 2020-09-10 DIAGNOSIS — D509 Iron deficiency anemia, unspecified: Secondary | ICD-10-CM | POA: Diagnosis present

## 2020-09-10 DIAGNOSIS — E118 Type 2 diabetes mellitus with unspecified complications: Secondary | ICD-10-CM | POA: Diagnosis not present

## 2020-09-10 DIAGNOSIS — I5030 Unspecified diastolic (congestive) heart failure: Secondary | ICD-10-CM | POA: Diagnosis present

## 2020-09-10 LAB — RESP PANEL BY RT-PCR (FLU A&B, COVID) ARPGX2
Influenza A by PCR: NEGATIVE
Influenza B by PCR: NEGATIVE
SARS Coronavirus 2 by RT PCR: NEGATIVE

## 2020-09-10 LAB — CBC
HCT: 21.3 % — CL (ref 39.0–52.0)
Hemoglobin: 6.5 g/dL — CL (ref 13.0–17.0)
MCHC: 30.3 g/dL (ref 30.0–36.0)
MCV: 70.6 fl — ABNORMAL LOW (ref 78.0–100.0)
Platelets: 150 10*3/uL (ref 150.0–400.0)
RBC: 3.02 Mil/uL — ABNORMAL LOW (ref 4.22–5.81)
RDW: 20.9 % — ABNORMAL HIGH (ref 11.5–15.5)
WBC: 5 10*3/uL (ref 4.0–10.5)

## 2020-09-10 LAB — IRON AND TIBC
Iron: 16 ug/dL — ABNORMAL LOW (ref 45–182)
Saturation Ratios: 3 % — ABNORMAL LOW (ref 17.9–39.5)
TIBC: 583 ug/dL — ABNORMAL HIGH (ref 250–450)
UIBC: 567 ug/dL

## 2020-09-10 LAB — COMPREHENSIVE METABOLIC PANEL
ALT: 18 U/L (ref 0–53)
ALT: 21 U/L (ref 0–44)
AST: 21 U/L (ref 0–37)
AST: 25 U/L (ref 15–41)
Albumin: 4 g/dL (ref 3.5–5.2)
Albumin: 3.7 g/dL (ref 3.5–5.0)
Alkaline Phosphatase: 170 U/L — ABNORMAL HIGH (ref 39–117)
Alkaline Phosphatase: 171 U/L — ABNORMAL HIGH (ref 38–126)
Anion gap: 10 (ref 5–15)
BUN: 31 mg/dL — ABNORMAL HIGH (ref 6–23)
BUN: 32 mg/dL — ABNORMAL HIGH (ref 8–23)
CO2: 27 mEq/L (ref 19–32)
CO2: 25 mmol/L (ref 22–32)
Calcium: 8.9 mg/dL (ref 8.4–10.5)
Calcium: 8.6 mg/dL — ABNORMAL LOW (ref 8.9–10.3)
Chloride: 103 mEq/L (ref 96–112)
Chloride: 104 mmol/L (ref 98–111)
Creatinine, Ser: 1.12 mg/dL (ref 0.40–1.50)
Creatinine, Ser: 1.09 mg/dL (ref 0.61–1.24)
GFR, Estimated: >60 mL/min (ref >60)
GFR: 64.72 mL/min (ref >60.00)
Glucose, Bld: 151 mg/dL — ABNORMAL HIGH (ref 70–99)
Glucose, Bld: 193 mg/dL — ABNORMAL HIGH (ref 70–99)
Potassium: 3.9 mEq/L (ref 3.5–5.1)
Potassium: 4.1 mmol/L (ref 3.5–5.1)
Sodium: 137 mEq/L (ref 135–145)
Sodium: 139 mmol/L (ref 135–145)
Total Bilirubin: 0.5 mg/dL (ref 0.2–1.2)
Total Bilirubin: 0.5 mg/dL (ref 0.3–1.2)
Total Protein: 7 g/dL (ref 6.0–8.3)
Total Protein: 7.3 g/dL (ref 6.5–8.1)

## 2020-09-10 LAB — CBC WITH DIFFERENTIAL/PLATELET
Abs Immature Granulocytes: 0.02 10*3/uL (ref 0.00–0.07)
Basophils Absolute: 0 10*3/uL (ref 0.0–0.1)
Basophils Relative: 0 %
Eosinophils Absolute: 0.1 10*3/uL (ref 0.0–0.5)
Eosinophils Relative: 2 %
HCT: 23.2 % — ABNORMAL LOW (ref 39.0–52.0)
Hemoglobin: 6.3 g/dL — CL (ref 13.0–17.0)
Immature Granulocytes: 0 %
Lymphocytes Relative: 11 %
Lymphs Abs: 0.5 10*3/uL — ABNORMAL LOW (ref 0.7–4.0)
MCH: 21.1 pg — ABNORMAL LOW (ref 26.0–34.0)
MCHC: 27.2 g/dL — ABNORMAL LOW (ref 30.0–36.0)
MCV: 77.6 fL — ABNORMAL LOW (ref 80.0–100.0)
Monocytes Absolute: 0.4 10*3/uL (ref 0.1–1.0)
Monocytes Relative: 7 %
Neutro Abs: 3.9 10*3/uL (ref 1.7–7.7)
Neutrophils Relative %: 80 %
Platelets: 157 10*3/uL (ref 150–400)
RBC: 2.99 MIL/uL — ABNORMAL LOW (ref 4.22–5.81)
RDW: 19.1 % — ABNORMAL HIGH (ref 11.5–15.5)
WBC: 4.9 10*3/uL (ref 4.0–10.5)
nRBC: 0 % (ref 0.0–0.2)

## 2020-09-10 LAB — RETICULOCYTES
Immature Retic Fract: 27.2 % — ABNORMAL HIGH (ref 2.3–15.9)
RBC.: 3.12 MIL/uL — ABNORMAL LOW (ref 4.22–5.81)
Retic Count, Absolute: 61.2 10*3/uL (ref 19.0–186.0)
Retic Ct Pct: 2 % (ref 0.4–3.1)

## 2020-09-10 LAB — VITAMIN B12: Vitamin B-12: 278 pg/mL (ref 180–914)

## 2020-09-10 LAB — PROTIME-INR
INR: 1.1 (ref 0.8–1.2)
Prothrombin Time: 13.5 seconds (ref 11.4–15.2)

## 2020-09-10 LAB — FERRITIN: Ferritin: 6 ng/mL — ABNORMAL LOW (ref 24–336)

## 2020-09-10 LAB — FOLATE: Folate: 17.2 ng/mL (ref >5.9)

## 2020-09-10 LAB — CBG MONITORING, ED: Glucose-Capillary: 89 mg/dL (ref 70–99)

## 2020-09-10 LAB — PREPARE RBC (CROSSMATCH)

## 2020-09-10 LAB — POC OCCULT BLOOD, ED: Fecal Occult Bld: POSITIVE — AB

## 2020-09-10 MED ORDER — PANTOPRAZOLE SODIUM 40 MG IV SOLR
40.0000 mg | Freq: Two times a day (BID) | INTRAVENOUS | Status: DC
  Administered 2020-09-11 – 2020-09-12 (×3): 40 mg via INTRAVENOUS
  Filled 2020-09-10 (×3): qty 40

## 2020-09-10 MED ORDER — ONDANSETRON HCL 4 MG/2ML IJ SOLN: 4.0000 mg | Freq: Four times a day (QID) | INTRAMUSCULAR | Status: DC | PRN

## 2020-09-10 MED ORDER — ALBUTEROL SULFATE HFA 108 (90 BASE) MCG/ACT IN AERS
2.0000 | INHALATION_SPRAY | Freq: Four times a day (QID) | RESPIRATORY_TRACT | Status: DC | PRN
  Filled 2020-09-10: qty 6.7

## 2020-09-10 MED ORDER — ATORVASTATIN CALCIUM 40 MG PO TABS
40.0000 mg | ORAL_TABLET | Freq: Every evening | ORAL | Status: DC
  Administered 2020-09-11: 40 mg via ORAL
  Filled 2020-09-10: qty 1

## 2020-09-10 MED ORDER — SODIUM CHLORIDE 0.9 % IV SOLN: 10.0000 mL/h | Freq: Once | INTRAVENOUS | Status: DC

## 2020-09-10 MED ORDER — ONDANSETRON HCL 4 MG PO TABS: 4.0000 mg | ORAL_TABLET | Freq: Four times a day (QID) | ORAL | Status: DC | PRN

## 2020-09-10 MED ORDER — OCTREOTIDE LOAD VIA INFUSION
50.0000 ug | Freq: Once | INTRAVENOUS | Status: AC
  Administered 2020-09-10: 50 ug via INTRAVENOUS
  Filled 2020-09-10: qty 25

## 2020-09-10 MED ORDER — SODIUM CHLORIDE 0.9 % IV SOLN
50.0000 ug/h | INTRAVENOUS | Status: DC
  Administered 2020-09-10 – 2020-09-12 (×4): 50 ug/h via INTRAVENOUS
  Filled 2020-09-10 (×4): qty 1

## 2020-09-10 MED ORDER — FUROSEMIDE 10 MG/ML IJ SOLN
20.0000 mg | Freq: Once | INTRAMUSCULAR | Status: AC
  Administered 2020-09-10: 20 mg via INTRAVENOUS
  Filled 2020-09-10: qty 4

## 2020-09-10 MED ORDER — INSULIN ASPART 100 UNIT/ML ~~LOC~~ SOLN
0.0000 [IU] | SUBCUTANEOUS | Status: DC
  Administered 2020-09-11 – 2020-09-12 (×3): 1 [IU] via SUBCUTANEOUS
  Filled 2020-09-10: qty 0.09

## 2020-09-10 MED ORDER — UMECLIDINIUM-VILANTEROL 62.5-25 MCG/INH IN AEPB
1.0000 | INHALATION_SPRAY | Freq: Every day | RESPIRATORY_TRACT | Status: DC
  Administered 2020-09-11: 1 via RESPIRATORY_TRACT
  Filled 2020-09-10 (×2): qty 14

## 2020-09-10 MED ORDER — SODIUM CHLORIDE 0.9 % IV SOLN: 250.0000 mL | INTRAVENOUS | Status: DC | PRN

## 2020-09-10 MED ORDER — SODIUM CHLORIDE 0.9 % IV SOLN
1.0000 g | INTRAVENOUS | Status: DC
  Administered 2020-09-10 – 2020-09-11 (×2): 1 g via INTRAVENOUS
  Filled 2020-09-10: qty 10
  Filled 2020-09-10: qty 1

## 2020-09-10 MED ORDER — PANTOPRAZOLE SODIUM 40 MG IV SOLR
40.0000 mg | Freq: Once | INTRAVENOUS | Status: AC
  Administered 2020-09-10: 40 mg via INTRAVENOUS
  Filled 2020-09-10: qty 40

## 2020-09-10 NOTE — ED Triage Notes (Signed)
Pt was sent for low hemoglobin (6.5); pt denies any sx or bloody/tarry stools

## 2020-09-10 NOTE — H&P (Addendum)
History and Physical    [name] QPR:[id number] DOB: 04-02-1946 DOA: 09/10/2020  PCP: [name], MD   Patient coming from: Home   Chief Complaint: Low Hgb on outpatient labs   HPI: [name] is a 75 y.o. male with medical history significant for alcoholic liver cirrhosis with ascites, chronic diastolic CHF, chronic kidney disease stage II, iron deficiency anemia, type 2 diabetes mellitus, hepatocellular carcinoma, coronary artery disease, and recent admission for upper GI bleed, now presenting to the emergency department at the direction of his PCP for evaluation of low hemoglobin. Patient was discharged from the hospital roughly 3 weeks ago after admission for upper GI bleed requiring blood transfusion and complicated by decompensated cirrhosis and heart failure. He had blood work performed for hospital follow-up visit, hemoglobin was 6.5, and he was directed to the ED. He reports feeling fairly well since returning home, has been trying to avoid alcohol but did have a beer a couple days ago. His stool has continued to be dark but not as noticeable as leading up to his prior admission and he has been attributing this to iron supplements. He has been struggling with constipation after running out of stool softener and laxative a few days ago and has had to strain to move his bowels. He denies any abdominal pain or vomiting. Denies any fevers or chills. Reports that his abdominal distention and bilateral lower extremity swelling are essentially unchanged since last month. He reports meeting with palliative care and his thought more about his CODE STATUS but continues to request full code while noting that he would not want to be kept on life support more than a day or 2.  ED Course: Upon arrival to the ED, patient is found to be afebrile, saturating well on room air, and with stable blood pressure. Chemistry panel notable for BUN of 32 and creatinine 1.09. CBC features a hemoglobin of  6.3, down from 7.4 three weeks ago. COVID-19 PCR is negative and fecal occult blood testing positive. Patient was given 40 mg IV Protonix and 1 unit of RBC is transfusing.  Review of Systems:  All other systems reviewed and apart from HPI, are negative.  Past Medical History:  Diagnosis Date  . Arthritis    "joints tighten up sometimes" (03/27/2104)  . Carcinoma of liver, hepatocellular ([place]) 06/30/2020  . CHF (congestive heart failure) ([place])   . Chronic lower back pain   . Coronary artery disease    a. 05/2012 Cath/PCI: LM 40ost, 50-60d, LAD 99p ruptured plaque (3.0x28 DES), LCX 50p/m, RCA 30-40p, 52m EF 65-70%.  . Coronary artery disease involving native coronary artery of native heart without angina pectoris    Severe left main disease at catheterization July 2015  CABG x3 with a LIMA to the LAD, SVG to the OM, SVG to the PDA on 03/31/14. EF 60% by cath.   . Diastolic heart failure ([place]   . GERD without esophagitis 08/04/2010  . Hepatic cirrhosis ([place]    a. Dx 01/2014 - CT a/p   . History of blood transfusion    "related to bleeding ulcers"  . History of concussion    1976--  NO RESIDUAL  . History of GI bleed    a. UGIB 07/2012;  b. 01/2014 admission with GIB/FOB stool req 1U prbc's->EGD showed portal gastropathy, barrett's esoph, and chronic active h. pylori gastritis.  .Mar[place] KitchenHistory of gout    2007 &  2008  LEFT LEG-- NO ISSUE SINCE  .  Hyperlipidemia   . Iron deficiency anemia   . Kidney stones   . OA (osteoarthritis of spine)    LOWER BACK--  INTERMITTANT LEFT LEG NUMBNESS  . OSA (obstructive sleep apnea)    PULMOLOGIST-  DR CLANCE--  MODERATE OSA  STARTED CPAP 2012--  BUT CURRENTLY HAS NOT USED PAST 6 MONTHS  . Phimosis    a. s/p circumcision 2015.  . Type 2 diabetes mellitus ([place])   . Unspecified essential hypertension     Past Surgical History:  Procedure Laterality Date  . ANKLE FRACTURE SURGERY Right 1989   "plate put in"  . APPENDECTOMY  05-16-2004   open  .  CIRCUMCISION N/A 09/09/2013   Procedure: CIRCUMCISION ADULT;  Surgeon: Bernestine Amass, MD;  Location: [facility];  Service: Urology;  Laterality: N/A;  . COLECTOMY  05-16-2004  . CORONARY ANGIOPLASTY WITH STENT PLACEMENT  06/28/2012  DR COOPER   PCI W/  X1 DES to [place]. LAD/  LM  40% OSTIAL & 50-60% DISTAL /  50% PROX LCX/  30-40% PROX RCA & 50% MID RCA/   LVEF 65-70%  . CORONARY ARTERY BYPASS GRAFT N/A 03/31/2014   Procedure: CORONARY ARTERY BYPASS GRAFTING (CABG) times 3 using left internal mammary artery and right saphenous vein.;  Surgeon: Melrose Nakayama, MD;  Location: [place];  Service: Open Heart Surgery;  Laterality: N/A;  . DOBUTAMINE STRESS ECHO  06-08-2012   MODERATE HYPOKINESIS/ ISCHEMIA MID INFERIOR WALL  . ESOPHAGOGASTRODUODENOSCOPY  08/15/2012   Procedure: ESOPHAGOGASTRODUODENOSCOPY (EGD);  Surgeon: Wonda Horner, MD;  Location: [facility] ENDOSCOPY;  Service: Endoscopy;  Laterality: N/A;  . ESOPHAGOGASTRODUODENOSCOPY N/A 02/17/2014   Procedure: ESOPHAGOGASTRODUODENOSCOPY (EGD);  Surgeon: Jeryl Columbia, MD;  Location[facility] ENDOSCOPY;  Service: Endoscopy;  Laterality: N/A;  . ESOPHAGOGASTRODUODENOSCOPY N/A 04/17/2020   Procedure: ESOPHAGOGASTRODUODENOSCOPY (EGD);  Surgeon: Ronnette Juniper, MD;  Location: [place];  Service: Gastroenterology;  Laterality: N/A;  . ESOPHAGOGASTRODUODENOSCOPY (EGD) WITH PROPOFOL N/A 09/30/2017   Procedure: ESOPHAGOGASTRODUODENOSCOPY (EGD) WITH PROPOFOL;  Surgeon: Wonda Horner, MD;  Location: [facility] ENDOSCOPY;  Service: Endoscopy;  Laterality: N/A;  . ESOPHAGOGASTRODUODENOSCOPY (EGD) WITH PROPOFOL N/A 12/20/2017   Procedure: ESOPHAGOGASTRODUODENOSCOPY (EGD) WITH PROPOFOL;  Surgeon: Clarene Essex, MD;  Location: [place];  Service: Endoscopy;  Laterality: N/A;  . ESOPHAGOGASTRODUODENOSCOPY (EGD) WITH PROPOFOL N/A 08/10/2020   Procedure: ESOPHAGOGASTRODUODENOSCOPY (EGD) WITH PROPOFOL;  Surgeon: Wilford Corner, MD;  Location: [place];  Service:  Endoscopy;  Laterality: N/A;  . HERNIA REPAIR    . INTRAOPERATIVE TRANSESOPHAGEAL ECHOCARDIOGRAM N/A 03/31/2014   Procedure: INTRAOPERATIVE TRANSESOPHAGEAL ECHOCARDIOGRAM;  Surgeon: Melrose Nakayama, MD;  Location: [place];  Service: Open Heart Surgery;  Laterality: N/A;  . IR ANGIOGRAM SELECTIVE EACH ADDITIONAL VESSEL  06/16/2020  . IR ANGIOGRAM SELECTIVE EACH ADDITIONAL VESSEL  06/16/2020  . IR ANGIOGRAM SELECTIVE EACH ADDITIONAL VESSEL  06/16/2020  . IR ANGIOGRAM SELECTIVE EACH ADDITIONAL VESSEL  06/16/2020  . IR ANGIOGRAM SELECTIVE EACH ADDITIONAL VESSEL  06/16/2020  . IR ANGIOGRAM SELECTIVE EACH ADDITIONAL VESSEL  06/16/2020  . IR ANGIOGRAM SELECTIVE EACH ADDITIONAL VESSEL  06/16/2020  . IR ANGIOGRAM SELECTIVE EACH ADDITIONAL VESSEL  06/16/2020  . IR ANGIOGRAM SELECTIVE EACH ADDITIONAL VESSEL  06/16/2020  . IR ANGIOGRAM SELECTIVE EACH ADDITIONAL VESSEL  06/30/2020  . IR ANGIOGRAM SELECTIVE EACH ADDITIONAL VESSEL  06/30/2020  . IR ANGIOGRAM VISCERAL SELECTIVE  06/16/2020  . IR ANGIOGRAM VISCERAL SELECTIVE  06/30/2020  . IR EMBO ARTERIAL NOT HEMORR HEMANG INC GUIDE ROADMAPPING  06/16/2020  .  IR EMBO TUMOR ORGAN ISCHEMIA INFARCT INC GUIDE ROADMAPPING  06/30/2020  . IR RADIOLOGIST EVAL & MGMT  05/06/2020  . IR RADIOLOGIST EVAL & MGMT  05/20/2020  . IR US GUIDE VASC ACCESS RIGHT  06/16/2020  . IR US GUIDE VASC ACCESS RIGHT  06/30/2020  . LAPAROSCOPIC UMBILICAL HERNIA REPAIR W/ MESH  06-06-2011  . LEFT HEART CATHETERIZATION WITH CORONARY ANGIOGRAM N/A 03/28/2014   Procedure: LEFT HEART CATHETERIZATION WITH CORONARY ANGIOGRAM;  Surgeon: Sinclair Grooms, MD;  Location[facility] CATH LAB;  Service: Cardiovascular;  Laterality: N/A;  . LIPOMA EXCISION Left 08/25/2017   Procedure: EXCISION LIPOMA LEFT POSTERIOR THIGH;  Surgeon: Clovis Riley, MD;  Location: [place];  Service: General;  Laterality: Left;  . NEPHROLITHOTOMY  1990'S  . OPEN APPENDECTOMY W/ PARTIAL CECECTOMY  05-16-2004  . PERCUTANEOUS  CORONARY STENT INTERVENTION (PCI-S) N/A 06/28/2012   Procedure: PERCUTANEOUS CORONARY STENT INTERVENTION (PCI-S);  Surgeon: Sherren Mocha, MD;  Location: [facility] CATH LAB;  Service: Cardiovascular;  Laterality: N/A;    Social History:   reports that he quit smoking about 24 years ago. His smoking use included cigarettes. He has a 80.00 pack-year smoking history. He has never used smokeless tobacco. He reports current alcohol use of about 8.0 standard drinks of alcohol per week. He reports that he does not use drugs.  No Known Allergies  Family History  Problem Relation Age of Onset  . Lung cancer Sister   . Cancer Sister        lung  . Cancer Mother   . Cancer Father        died in his 73s.  . Cancer Brother        lung  . Coronary artery disease Other   . Diabetes Other   . Colon cancer Other   . Cancer Sister        lung     Prior to Admission medications   Medication Sig Start Date End Date Taking? Authorizing Provider  albuterol (VENTOLIN HFA) 108 (90 Base) MCG/ACT inhaler Inhale 2 puffs into the lungs every 6 (six) hours as needed for wheezing or shortness of breath. 03/12/20  Yes [name], MD  atorvastatin (LIPITOR) 40 MG tablet TAKE 1 TABLET BY MOUTH DAILY AT 6 PM Patient taking differently: Take by mouth daily. 05/13/20  Yes [name], MD  gabapentin (NEURONTIN) 100 MG capsule TAKE 1 CAPSULE(100 MG) BY MOUTH TWICE DAILY Patient taking differently: Take 100 mg by mouth 2 (two) times daily. 08/03/20  Yes [name], MD  glimepiride (AMARYL) 2 MG tablet Take 2 mg by mouth daily. 07/13/20  Yes [provider]  metFORMIN (GLUCOPHAGE) 850 MG tablet TAKE 1 TABLET(850 MG) BY MOUTH TWICE DAILY WITH A MEAL Patient taking differently: Take 850 mg by mouth 2 (two) times daily with a meal. 06/16/20  Yes [name], MD  nitroGLYCERIN (NITROSTAT) 0.4 MG SL tablet Place 1 tablet (0.4 mg total) under the tongue every 5 (five) minutes as  needed for chest pain. 06/08/16  Yes Nahser, Wonda Cheng, MD  pantoprazole (PROTONIX) 40 MG tablet Take 1 tablet (40 mg total) by mouth daily. 08/18/20  Yes Danford, Suann Larry, MD  potassium chloride (KLOR-CON) 20 MEQ tablet Take 1 tablet (20 mEq total) by mouth daily. 08/18/20  Yes Danford, Suann Larry, MD  Sennosides (EX-LAX PO) Take 1 tablet by mouth daily as needed (constipation).   Yes [provider]  spironolactone (ALDACTONE) 25 MG tablet Take 1  tablet (25 mg total) by mouth daily. 08/18/20  Yes Danford, Suann Larry, MD  torsemide (DEMADEX) 20 MG tablet Take 2 tablets (40 mg total) by mouth 2 (two) times daily. 08/18/20  Yes Danford, Suann Larry, MD  umeclidinium-vilanterol (ANORO ELLIPTA) 62.5-25 MCG/INH AEPB Inhale 1 puff into the lungs daily. 03/12/20  Yes [name], MD  Accu-Chek Softclix Lancets lancets USE AS DIRECTED TO TEST BLOOD SUGAR FOUR TIMES DAILY 03/13/20   [name], MD  blood glucose meter kit and supplies Dispense based on patient and insurance preference. Use up to four times daily as directed. (FOR ICD-10 E10.9, E11.9). 03/12/20   [name], MD  ferrous sulfate 325 (65 FE) MG EC tablet Take 1 tablet (325 mg total) by mouth daily with breakfast. 01/16/20   [name], MD    Physical Exam: Vitals:   09/10/20 2130 09/10/20 2227 09/10/20 2230 09/10/20 2245  BP: (!) 119/54 (!) 152/67 (!) 150/66 131/66  Pulse: 72 72 72 69  Resp:  16  17  Temp:  98 F (36.7 C)  98.2 F (36.8 C)  TempSrc:  Oral  Axillary  SpO2: 100% 100% 100% 100%    Constitutional: NAD, calm  Eyes: PERTLA, lids and conjunctivae normal ENMT: Mucous membranes are moist. Posterior pharynx clear of any exudate or lesions.   Neck: normal, supple, no masses, no thyromegaly Respiratory: no wheezing, no crackles. No accessory muscle use.  Cardiovascular: S1 & S2 heard, regular rate and rhythm. Lower leg swelling bilaterally.   Abdomen: Distended,  soft, no tenderness. Bowel sounds active.  Musculoskeletal: no clubbing / cyanosis. No joint deformity upper and lower extremities.   Skin: no significant rashes, lesions, ulcers. Warm, dry, well-perfused. Neurologic: CN 2-12 grossly intact. Sensation intact. Moving all extremities.  Psychiatric: Alert and oriented to person, place, and situation. Pleasant and cooperative.    Labs and Imaging on Admission: I have personally reviewed following labs and imaging studies  CBC: Recent Labs  Lab 09/10/20 1442 09/10/20 1833  WBC 5.0 4.9  NEUTROABS  --  3.9  HGB 6.5 Repeated and verified X2.* 6.3*  HCT 21.3 Repeated and verified X2.* 23.2*  MCV 70.6* 77.6*  PLT 150.0 224   Basic Metabolic Panel: Recent Labs  Lab 09/10/20 1442 09/10/20 1833  NA 137 139  K 3.9 4.1  CL 103 104  CO2 27 25  GLUCOSE 151* 193*  BUN 31* 32*  CREATININE 1.12 1.09  CALCIUM 8.9 8.6*   GFR: Estimated Creatinine Clearance: 70.1 mL/min (by C-G formula based on SCr of 1.09 mg/dL). Liver Function Tests: Recent Labs  Lab 09/10/20 1442 09/10/20 1833  AST 21 25  ALT 18 21  ALKPHOS 170* 171*  BILITOT 0.5 0.5  PROT 7.0 7.3  ALBUMIN 4.0 3.7   No results for input(s): LIPASE, AMYLASE in the last 168 hours. No results for input(s): AMMONIA in the last 168 hours. Coagulation Profile: Recent Labs  Lab 09/10/20 1833  INR 1.1   Cardiac Enzymes: No results for input(s): CKTOTAL, CKMB, CKMBINDEX, TROPONINI in the last 168 hours. BNP (last 3 results) No results for input(s): PROBNP in the last 8760 hours. HbA1C: No results for input(s): HGBA1C in the last 72 hours. CBG: No results for input(s): GLUCAP in the last 168 hours. Lipid Profile: No results for input(s): CHOL, HDL, LDLCALC, TRIG, CHOLHDL, LDLDIRECT in the last 72 hours. Thyroid Function Tests: No results for input(s): TSH, T4TOTAL, FREET4, T3FREE, THYROIDAB in the last 72 hours. Anemia Panel:  Recent Labs    09/10/20 1958  VITAMINB12 278   FOLATE 17.2  FERRITIN 6*  TIBC 583*  IRON 16*  RETICCTPCT 2.0   Urine analysis:    Component Value Date/Time   COLORURINE YELLOW 05/06/2020 0954   APPEARANCEUR CLEAR 05/06/2020 0954   LABSPEC 1.009 05/06/2020 0954   PHURINE 7.0 05/06/2020 0954   GLUCOSEU NEGATIVE 05/06/2020 0954   HGBUR NEGATIVE 05/06/2020 0954   BILIRUBINUR NEGATIVE 07/29/2019 1849   KETONESUR NEGATIVE 05/06/2020 0954   PROTEINUR NEGATIVE 05/06/2020 0954   UROBILINOGEN 0.2 03/29/2014 1546   NITRITE NEGATIVE 05/06/2020 0954   LEUKOCYTESUR NEGATIVE 05/06/2020 0954   Sepsis Labs: '@LABRCNTIP' (procalcitonin:4,lacticidven:4) ) Recent Results (from the past 240 hour(s))  Resp Panel by RT-PCR (Flu A&B, Covid) Nasopharyngeal Swab     Status: None   Collection Time: 09/10/20  8:36 PM   Specimen: Nasopharyngeal Swab; Nasopharyngeal(NP) swabs in vial transport medium  Result Value Ref Range Status   SARS Coronavirus 2 by RT PCR NEGATIVE NEGATIVE Final    Comment: (NOTE) SARS-CoV-2 target nucleic acids are NOT DETECTED.  The SARS-CoV-2 RNA is generally detectable in upper respiratory specimens during the acute phase of infection. The lowest concentration of SARS-CoV-2 viral copies this assay can detect is 138 copies/mL. A negative result does not preclude SARS-Cov-2 infection and should not be used as the sole basis for treatment or other patient management decisions. A negative result may occur with  improper specimen collection/handling, submission of specimen other than nasopharyngeal swab, presence of viral mutation(s) within the areas targeted by this assay, and inadequate number of viral copies(<138 copies/mL). A negative result must be combined with clinical observations, patient history, and epidemiological information. The expected result is Negative.  Fact Sheet for Patients:  EntrepreneurPulse.com.au  Fact Sheet for Healthcare Providers:   IncredibleEmployment.be  This test is no t yet approved or cleared by the [place] FDA and  has been authorized for detection and/or diagnosis of SARS-CoV-2 by FDA under an Emergency Use Authorization (EUA). This EUA will remain  in effect (meaning this test can be used) for the duration of the COVID-19 declaration under Section 564(b)(1) of the Act, 21 U.S.C.section 360bbb-3(b)(1), unless the authorization is terminated  or revoked sooner.       Influenza A by PCR NEGATIVE NEGATIVE Final   Influenza B by PCR NEGATIVE NEGATIVE Final    Comment: (NOTE) The Xpert Xpress SARS-CoV-2/FLU/RSV plus assay is intended as an aid in the diagnosis of influenza from Nasopharyngeal swab specimens and should not be used as a sole basis for treatment. Nasal washings and aspirates are unacceptable for Xpert Xpress SARS-CoV-2/FLU/RSV testing.  Fact Sheet for Patients: EntrepreneurPulse.com.au  Fact Sheet for Healthcare Providers: IncredibleEmployment.be  This test is not yet approved or cleared by the [place] FDA and has been authorized for detection and/or diagnosis of SARS-CoV-2 by FDA under an Emergency Use Authorization (EUA). This EUA will remain in effect (meaning this test can be used) for the duration of the COVID-19 declaration under Section 564(b)(1) of the Act, 21 U.S.C. section 360bbb-3(b)(1), unless the authorization is terminated or revoked.  Performed at [facility], [place] [address], [place], [place] [address]      Radiological Exams on Admission: No results found.  Assessment/Plan   1. GI bleeding; iron-deficiency anemia  - Presents with Hgb 6.3, down from 7.4 three weeks ago, is FOBT+, reports dark stool that he attributes to iron supplements, is hemodynamically stable with no abd pain or N/V  -  EGD from 1 mo ago revealed grade 1 esophageal varices, portal hypertensive gastropathy,  friable gastric mucosa, type 1 gastric varices   - He was give 40 mg IV Protonix in ED and 1 unit RBC is transfusing  - Continue IV PPI, start octreotide for now, ppx Rocephin, check post-transfusion CBC   ADDENDUM: Dr. Cristina Gong kindly reviewed the case and agrees to see patient in am. Hgb 6.7 after 1 unit RBC and second unit will be transfused.   2. Cirrhosis; hepatocellular carcinoma  - Patient has alcoholic cirrhosis with ascites and HCC s/p chemoembolization in November '21  - He continues to drink occasional beer  - Abd distended but soft and non-tender  - Give Lasix with blood transfusion, encourage strict alcohol avoidance    3. Chronic diastolic CHF  - Appears compensated, give Lasix after transfusion, monitor weight and I/Os    4. Type II DM  - A1c was 5.3% a month ago; serum glucose 193 in ED  - Check CBGs and use a low-intensity SSI if needed    5. CKD II  - SCr is 1.09 admission, appears to be baseline  - Renally-dose medications, monitor    6. Asthma  - No cough or wheeze on admission  - Continue Anoro and prn albuterol    7. CAD  - No anginal complaints, continue statin     DVT prophylaxis: SCDs Code Status: Full  Family Communication: Discussed with patient  Disposition Plan:  Patient is from: Home  Anticipated d/c is to: Home  Anticipated d/c date is: Possibly as early as 09/11/20 Patient currently: Pending blood transfusion, GI consult  Consults called: [facility], Dr. Cristina Gong    Admission status: Observation     Vianne Bulls, MD [facility]  09/10/2020, 11:05 PM

## 2020-09-10 NOTE — Telephone Encounter (Signed)
Received call from Santiago Glad at [facility]. His Hgb is 6.5, Hct is 21.3. I informed PCP on her cell. PCP advises pt go to ER now. Pt informed.

## 2020-09-10 NOTE — Patient Instructions (Signed)
We will check the labs and call you back about the results.    

## 2020-09-10 NOTE — Progress Notes (Signed)
   Subjective:   Patient ID: [name], male    DOB: April 03, 1946, 75 y.o.   MRN: [id number]  HPI The patient is a 75 YO man coming in for needing PT orders/hospital follow up. Hospitalized in December with GI bleeding and fluid overload. He is working on decreasing salt since that time. Weighing daily and weights stable. Denies SOB. Denies noticeable blood in stool. No nausea or vomiting. Denies fevers or chills. Denies chest pain. Still with swelling in both legs about the same as usual.   PMH, [facility], social history reviewed and updated.   Review of Systems  Constitutional: Negative.   HENT: Negative.   Eyes: Negative.   Respiratory: Negative for cough, chest tightness and shortness of breath.   Cardiovascular: Negative for chest pain, palpitations and leg swelling.  Gastrointestinal: Negative for abdominal distention, abdominal pain, constipation, diarrhea, nausea and vomiting.  Musculoskeletal: Negative.   Skin: Negative.   Neurological: Negative.   Psychiatric/Behavioral: Negative.     Objective:  Physical Exam Constitutional:      Appearance: He is well-developed and well-nourished. He is obese.  HENT:     Head: Normocephalic and atraumatic.  Eyes:     Extraocular Movements: EOM normal.  Cardiovascular:     Rate and Rhythm: Normal rate and regular rhythm.  Pulmonary:     Effort: Pulmonary effort is normal. No respiratory distress.     Breath sounds: Normal breath sounds. No wheezing or rales.  Abdominal:     General: Bowel sounds are normal. There is distension.     Palpations: Abdomen is soft.     Tenderness: There is no abdominal tenderness. There is no rebound.  Musculoskeletal:     Cervical back: Normal range of motion.     Right lower leg: Edema present.     Left lower leg: Edema present.  Skin:    General: Skin is warm and dry.  Neurological:     Mental Status: He is alert and oriented to person, place, and time.     Coordination: Coordination normal.   Psychiatric:        Mood and Affect: Mood and affect normal.     Vitals:   09/10/20 1418  BP: 140/60  Pulse: 86  Temp: 98.1 F (36.7 C)  TempSrc: Oral  SpO2: 96%  Weight: 233 lb (105.7 kg)  Height: 5\' 8"  (1.727 m)    This visit occurred during the SARS-CoV-2 public health emergency.  Safety protocols were in place, including screening questions prior to the visit, additional usage of staff PPE, and extensive cleaning of exam room while observing appropriate contact time as indicated for disinfecting solutions.   Assessment & Plan:  Visit time 15 minutes in face to face communication with patient and coordination of care, additional 15 minutes spent in record review, coordination or care, ordering tests, communicating/referring to other healthcare professionals, documenting in medical records all on the same day of the visit for total time 30 minutes spent on the visit.

## 2020-09-10 NOTE — ED Notes (Signed)
Date and time results received: 09/10/20 1946 (use smartphrase ".now" to insert current time)  Test: Hgb Critical Value: 6.3  Name of Provider Notified: Vanita Panda Md  Orders Received? Or Actions Taken?: waiting on orders

## 2020-09-10 NOTE — ED Provider Notes (Signed)
[facility] DEPT Provider Note   CSN: [id number] Arrival date & time: 09/10/20  1728     History Chief Complaint  Patient presents with  . Low Hemoglobin    [name] is a 75 y.o. male with a past medical history of CAD, CHF, DM2, hepatocellular carcinoma, iron deficiency anemia, who presents today for evaluation of anemia.  He went to his primary care doctor's office and was found to have a hemoglobin under 7.  He reports that he has had some constipation recently however denies bright red blood or dark tarry sticky stools. He denies any fevers.  No new abdominal pain.  He denies cough or shortness of breath.  No chest pain.  HPI     Past Medical History:  Diagnosis Date  . Arthritis    "joints tighten up sometimes" (03/27/2104)  . Carcinoma of liver, hepatocellular ([place]) 06/30/2020  . CHF (congestive heart failure) ([place])   . Chronic lower back pain   . Coronary artery disease    a. 05/2012 Cath/PCI: LM 40ost, 50-60d, LAD 99p ruptured plaque (3.0x28 DES), LCX 50p/m, RCA 30-40p, 90m EF 65-70%.  . Coronary artery disease involving native coronary artery of native heart without angina pectoris    Severe left main disease at catheterization July 2015  CABG x3 with a LIMA to the LAD, SVG to the OM, SVG to the PDA on 03/31/14. EF 60% by cath.   . Diastolic heart failure ([facility]   . GERD without esophagitis 08/04/2010  . Hepatic cirrhosis ([facility]    a. Dx 01/2014 - CT a/p   . History of blood transfusion    "related to bleeding ulcers"  . History of concussion    1976--  NO RESIDUAL  . History of GI bleed    a. UGIB 07/2012;  b. 01/2014 admission with GIB/FOB stool req 1U prbc's->EGD showed portal gastropathy, barrett's esoph, and chronic active h. pylori gastritis.  .Marland KitchenHistory of gout    2007 &  2008  LEFT LEG-- NO ISSUE SINCE  . Hyperlipidemia   . Iron deficiency anemia   . Kidney stones   . OA (osteoarthritis of spine)    LOWER BACK--  INTERMITTANT  LEFT LEG NUMBNESS  . OSA (obstructive sleep apnea)    PULMOLOGIST-  DR CLANCE--  MODERATE OSA  STARTED CPAP 2012--  BUT CURRENTLY HAS NOT USED PAST 6 MONTHS  . Phimosis    a. s/p circumcision 2015.  . Type 2 diabetes mellitus ([facility]   . Unspecified essential hypertension     Patient Active Problem List   Diagnosis Date Noted  . GI bleeding 09/10/2020  . Hepatocellular carcinoma ([facility] 08/09/2020  . Routine general medical examination at a health care facility 05/06/2020  . Iron deficiency anemia   . Symptomatic anemia 04/15/2020  . Pulmonary hypertension, unspecified ([facility] 03/11/2020  . Asthma, mild intermittent 12/27/2019  . Alcoholic cirrhosis of liver with ascites ([facility] 12/27/2019  . (HFpEF) heart failure with preserved ejection fraction ([facility] 12/24/2018  . CKD (chronic kidney disease), stage III ([facility] 12/18/2017  . Peptic ulcer disease 12/18/2017  . GI bleed 09/29/2017  . Morbid obesity ([facility] 02/05/2016  . S/P CABG x 3 03/31/2014  . Hepatic cirrhosis ([facility] 02/27/2014  . Hyperlipidemia with target LDL less than 70 06/29/2012  . Thrombocytopenia ([facility] 06/29/2012  . Coronary artery disease involving native coronary artery of native heart without angina pectoris   . OSA (obstructive sleep apnea)   . Type 2 diabetes with  complication ([place]) 43/15/4008  . GERD without esophagitis 08/04/2010  . History of gout   . Hypertensive heart disease     Past Surgical History:  Procedure Laterality Date  . ANKLE FRACTURE SURGERY Right 1989   "plate put in"  . APPENDECTOMY  05-16-2004   open  . CIRCUMCISION N/A 09/09/2013   Procedure: CIRCUMCISION ADULT;  Surgeon: Bernestine Amass, MD;  Location[facility];  Service: Urology;  Laterality: N/A;  . COLECTOMY  05-16-2004  . CORONARY ANGIOPLASTY WITH STENT PLACEMENT  06/28/2012  DR COOPER   PCI W/  X1 DES to [place]. LAD/  LM  40% OSTIAL & 50-60% DISTAL /  50% PROX LCX/  30-40% PROX RCA & 50% MID RCA/   LVEF 65-70%  . CORONARY  ARTERY BYPASS GRAFT N/A 03/31/2014   Procedure: CORONARY ARTERY BYPASS GRAFTING (CABG) times 3 using left internal mammary artery and right saphenous vein.;  Surgeon: Melrose Nakayama, MD;  Location: [facility];  Service: Open Heart Surgery;  Laterality: N/A;  . DOBUTAMINE STRESS ECHO  06-08-2012   MODERATE HYPOKINESIS/ ISCHEMIA MID INFERIOR WALL  . ESOPHAGOGASTRODUODENOSCOPY  08/15/2012   Procedure: ESOPHAGOGASTRODUODENOSCOPY (EGD);  Surgeon: Wonda Horner, MD;  Location[facility]COPY;  Service: Endoscopy;  Laterality: N/A;  . ESOPHAGOGASTRODUODENOSCOPY N/A 02/17/2014   Procedure: ESOPHAGOGASTRODUODENOSCOPY (EGD);  Surgeon: Jeryl Columbia, MD;  Location[facility] ENDOSCOPY;  Service: Endoscopy;  Laterality: N/A;  . ESOPHAGOGASTRODUODENOSCOPY N/A 04/17/2020   Procedure: ESOPHAGOGASTRODUODENOSCOPY (EGD);  Surgeon: Ronnette Juniper, MD;  Location: [facility];  Service: Gastroenterology;  Laterality: N/A;  . ESOPHAGOGASTRODUODENOSCOPY (EGD) WITH PROPOFOL N/A 09/30/2017   Procedure: ESOPHAGOGASTRODUODENOSCOPY (EGD) WITH PROPOFOL;  Surgeon: Wonda Horner, MD;  Location: [facility] ENDOSCOPY;  Service: Endoscopy;  Laterality: N/A;  . ESOPHAGOGASTRODUODENOSCOPY (EGD) WITH PROPOFOL N/A 12/20/2017   Procedure: ESOPHAGOGASTRODUODENOSCOPY (EGD) WITH PROPOFOL;  Surgeon: Clarene Essex, MD;  Location: [facility];  Service: Endoscopy;  Laterality: N/A;  . ESOPHAGOGASTRODUODENOSCOPY (EGD) WITH PROPOFOL N/A 08/10/2020   Procedure: ESOPHAGOGASTRODUODENOSCOPY (EGD) WITH PROPOFOL;  Surgeon: Wilford Corner, MD;  Location: [facility];  Service: Endoscopy;  Laterality: N/A;  . HERNIA REPAIR    . INTRAOPERATIVE TRANSESOPHAGEAL ECHOCARDIOGRAM N/A 03/31/2014   Procedure: INTRAOPERATIVE TRANSESOPHAGEAL ECHOCARDIOGRAM;  Surgeon: Melrose Nakayama, MD;  Location: [place] [facility];  Service: Open Heart Surgery;  Laterality: N/A;  . IR ANGIOGRAM SELECTIVE EACH ADDITIONAL VESSEL  06/16/2020  . IR ANGIOGRAM SELECTIVE EACH ADDITIONAL VESSEL  06/16/2020  . IR  ANGIOGRAM SELECTIVE EACH ADDITIONAL VESSEL  06/16/2020  . IR ANGIOGRAM SELECTIVE EACH ADDITIONAL VESSEL  06/16/2020  . IR ANGIOGRAM SELECTIVE EACH ADDITIONAL VESSEL  06/16/2020  . IR ANGIOGRAM SELECTIVE EACH ADDITIONAL VESSEL  06/16/2020  . IR ANGIOGRAM SELECTIVE EACH ADDITIONAL VESSEL  06/16/2020  . IR ANGIOGRAM SELECTIVE EACH ADDITIONAL VESSEL  06/16/2020  . IR ANGIOGRAM SELECTIVE EACH ADDITIONAL VESSEL  06/16/2020  . IR ANGIOGRAM SELECTIVE EACH ADDITIONAL VESSEL  06/30/2020  . IR ANGIOGRAM SELECTIVE EACH ADDITIONAL VESSEL  06/30/2020  . IR ANGIOGRAM VISCERAL SELECTIVE  06/16/2020  . IR ANGIOGRAM VISCERAL SELECTIVE  06/30/2020  . IR EMBO ARTERIAL NOT HEMORR HEMANG INC GUIDE ROADMAPPING  06/16/2020  . IR EMBO TUMOR ORGAN ISCHEMIA INFARCT INC GUIDE ROADMAPPING  06/30/2020  . IR RADIOLOGIST EVAL & MGMT  05/06/2020  . IR RADIOLOGIST EVAL & MGMT  05/20/2020  . IR US GUIDE VASC ACCESS RIGHT  06/16/2020  . IR US GUIDE VASC ACCESS RIGHT  06/30/2020  . LAPAROSCOPIC UMBILICAL HERNIA REPAIR W/ MESH  06-06-2011  .  LEFT HEART CATHETERIZATION WITH CORONARY ANGIOGRAM N/A 03/28/2014   Procedure: LEFT HEART CATHETERIZATION WITH CORONARY ANGIOGRAM;  Surgeon: Sinclair Grooms, MD;  [facility] C[facility] LAB;  Service: Cardiovascular;  Laterality: N/A;  . LIPOMA EXCISION Left 08/25/2017   Procedure: EXCISION LIPOMA LEFT POSTERIOR THIGH;  Surgeon: Clovis Riley, MD;  Location: [facility];  Service: General;  Laterality: Left;  . NEPHROLITHOTOMY  1990'S  . OPEN APPENDECTOMY W/ PARTIAL CECECTOMY  05-16-2004  . PERCUTANEOUS CORONARY STENT INTERVENTION (PCI-S) N/A 06/28/2012   Procedure: PERCUTANEOUS CORONARY STENT INTERVENTION (PCI-S);  Surgeon: Sherren Mocha, MD;  Location: [facility] CATH LAB;  Service: Cardiovascular;  Laterality: N/A;       Family History  Problem Relation Age of Onset  . Lung cancer Sister   . Cancer Sister        lung  . Cancer Mother   . Cancer Father        died in his 1s.  . Cancer Brother         lung  . Coronary artery disease Other   . Diabetes Other   . Colon cancer Other   . Cancer Sister        lung    Social History   Tobacco Use  . Smoking status: Former Smoker    Packs/day: 2.00    Years: 40.00    Pack years: 80.00    Types: Cigarettes    Quit date: 08/29/1996    Years since quitting: 24.0  . Smokeless tobacco: Never Used  Vaping Use  . Vaping Use: Never used  Substance Use Topics  . Alcohol use: Yes    Alcohol/week: 8.0 standard drinks    Types: 8 Cans of beer per week    Comment: 2 beers every other day  . Drug use: No    Home Medications Prior to Admission medications   Medication Sig Start Date End Date Taking? Authorizing Provider  Accu-Chek Softclix Lancets lancets USE AS DIRECTED TO TEST BLOOD SUGAR FOUR TIMES DAILY 03/13/20   [name], MD  albuterol (VENTOLIN HFA) 108 (90 Base) MCG/ACT inhaler Inhale 2 puffs into the lungs every 6 (six) hours as needed for wheezing or shortness of breath. 03/12/20   [name], MD  atorvastatin (LIPITOR) 40 MG tablet TAKE 1 TABLET BY MOUTH DAILY AT 6 PM Patient taking differently: Take by mouth daily. 05/13/20   [name], MD  blood glucose meter kit and supplies Dispense based on patient and insurance preference. Use up to four times daily as directed. (FOR ICD-10 E10.9, E11.9). 03/12/20   [name], MD  ferrous sulfate 325 (65 FE) MG EC tablet Take 1 tablet (325 mg total) by mouth daily with breakfast. 01/16/20   [name], MD  gabapentin (NEURONTIN) 100 MG capsule TAKE 1 CAPSULE(100 MG) BY MOUTH TWICE DAILY Patient taking differently: Take 100 mg by mouth 2 (two) times daily. 08/03/20   [name], MD  glimepiride (AMARYL) 2 MG tablet Take 2 mg by mouth daily. 07/13/20   [provider]  metFORMIN (GLUCOPHAGE) 850 MG tablet TAKE 1 TABLET(850 MG) BY MOUTH TWICE DAILY WITH A MEAL Patient taking differently: Take 850 mg by mouth 2 (two) times  daily with a meal. 06/16/20   [name], MD  nitroGLYCERIN (NITROSTAT) 0.4 MG SL tablet Place 1 tablet (0.4 mg total) under the tongue every 5 (five) minutes as needed for chest pain. 06/08/16   Nahser, Wonda Cheng, MD  pantoprazole (PROTONIX) 40 MG tablet Take 1 tablet (40 mg total) by mouth daily. 08/18/20   Danford, Suann Larry, MD  potassium chloride (KLOR-CON) 20 MEQ tablet Take 1 tablet (20 mEq total) by mouth daily. 08/18/20   Danford, Suann Larry, MD  Sennosides (EX-LAX PO) Take 1 tablet by mouth daily as needed (constipation).    [provider]  spironolactone (ALDACTONE) 25 MG tablet Take 1 tablet (25 mg total) by mouth daily. 08/18/20   Danford, Suann Larry, MD  torsemide (DEMADEX) 20 MG tablet Take 2 tablets (40 mg total) by mouth 2 (two) times daily. 08/18/20   Danford, Suann Larry, MD  umeclidinium-vilanterol (ANORO ELLIPTA) 62.5-25 MCG/INH AEPB Inhale 1 puff into the lungs daily. 03/12/20   [name], MD    Allergies    Patient has no known allergies.  Review of Systems   Review of Systems  Constitutional: Negative for chills and fever.  Respiratory: Negative for chest tightness and shortness of breath (Unchanged from baseline).   Cardiovascular: Positive for leg swelling (Chronic unchanged). Negative for chest pain.  Gastrointestinal: Negative for anal bleeding and blood in stool.  Neurological: Negative for headaches.  All other systems reviewed and are negative.   Physical Exam Updated Vital Signs BP (!) 119/54   Pulse 72   Temp 98.6 F (37 C)   Resp 17   SpO2 100%   Physical Exam Vitals and nursing note reviewed. Exam conducted with a chaperone present.  Constitutional:      Appearance: He is well-developed and well-nourished.     Comments: Chronically ill-appearing  HENT:     Head: Normocephalic and atraumatic.  Eyes:     Conjunctiva/sclera: Conjunctivae normal.  Cardiovascular:     Rate and Rhythm: Normal rate and  regular rhythm.     Pulses: Normal pulses.     Heart sounds: No murmur heard.   Pulmonary:     Effort: Pulmonary effort is normal. No respiratory distress.     Breath sounds: Normal breath sounds.  Abdominal:     Palpations: Abdomen is soft.     Tenderness: There is no abdominal tenderness. There is no guarding.  Genitourinary:    Comments: Melena Musculoskeletal:     Cervical back: Normal range of motion and neck supple.     Right lower leg: Edema present.     Left lower leg: Edema present.  Skin:    General: Skin is warm and dry.     Coloration: Skin is pale.  Neurological:     General: No focal deficit present.     Mental Status: He is alert and oriented to person, place, and time.     Comments: Patient is awake and alert, answers questions appropriately.  Psychiatric:        Mood and Affect: Mood and affect and mood normal.        Behavior: Behavior normal.     ED Results / Procedures / Treatments   Labs (all labs ordered are listed, but only abnormal results are displayed) Labs Reviewed  COMPREHENSIVE METABOLIC PANEL - Abnormal; Notable for the following components:      Result Value   Glucose, Bld 193 (*)    BUN 32 (*)    Calcium 8.6 (*)    Alkaline Phosphatase 171 (*)    All other components within normal limits  CBC WITH DIFFERENTIAL/PLATELET - Abnormal; Notable for the following components:   RBC 2.99 (*)    Hemoglobin 6.3 (*)    HCT  23.2 (*)    MCV 77.6 (*)    MCH 21.1 (*)    MCHC 27.2 (*)    RDW 19.1 (*)    Lymphs Abs 0.5 (*)    All other components within normal limits  IRON AND TIBC - Abnormal; Notable for the following components:   Iron 16 (*)    TIBC 583 (*)    Saturation Ratios 3 (*)    All other components within normal limits  FERRITIN - Abnormal; Notable for the following components:   Ferritin 6 (*)    All other components within normal limits  RETICULOCYTES - Abnormal; Notable for the following components:   RBC. 3.12 (*)    Immature  Retic Fract 27.2 (*)    All other components within normal limits  POC OCCULT BLOOD, ED - Abnormal; Notable for the following components:   Fecal Occult Bld POSITIVE (*)    All other components within normal limits  RESP PANEL BY RT-PCR (FLU A&B, COVID) ARPGX2  PROTIME-INR  VITAMIN B12  FOLATE  TYPE AND SCREEN  PREPARE RBC (CROSSMATCH)    EKG None  Radiology No results found.  Procedures .Critical Care Performed by: Lorin Glass, PA-C Authorized by: Lorin Glass, PA-C   Critical care provider statement:    Critical care time (minutes):  45   Critical care time was exclusive of:  Separately billable procedures and treating other patients and teaching time   Critical care was time spent personally by me on the following activities:  Discussions with consultants, evaluation of patient's response to treatment, examination of patient, ordering and performing treatments and interventions, ordering and review of laboratory studies, ordering and review of radiographic studies, pulse oximetry, re-evaluation of patient's condition, obtaining history from patient or surrogate and review of old charts   Care discussed with: admitting provider     (including critical care time)  Medications Ordered in ED Medications  0.9 %  sodium chloride infusion (has no administration in time range)  pantoprazole (PROTONIX) injection 40 mg (has no administration in time range)    ED Course  I have reviewed the triage vital signs and the nursing notes.  Pertinent labs & imaging results that were available during my care of the patient were reviewed by me and considered in my medical decision making (see chart for details).    MDM Rules/Calculators/A&P                         Patient is a 75 year old man who presents today for evaluation of anemia.  He had labs obtained by his PCP prior to arrival. Chart review shows that he was seen for similar in December last year.  He was  evaluated by GI then, it appears that his bleeding from EGD was small nonbleeding varices, severe gastric portal hypertension with friability and contact bleeding in the stomach.  On exam patient has melena.  He is asymptomatic. It also appears that patient and his stay in December had to be diuresed 10 L with IV Lasix and that they were balancing blood and diuretics. Repeat CBC shows he is anemic with a hemoglobin of 6.3.  Anemia panel is sent.  PT/INR is normal.  COVID test is ordered.  Patient consented for blood transfusion. Blood transfusion is ordered.  Given that on patient's last admission he required balancing blood transfusion and diuresis I would be concerned if I transfused him 1 unit and sent him home but this  would either not be sufficient given his melena on exam or that this would result in fluid overload.  Given these concerns patient will be admitted. Spoke with Dr. Myna Hidalgo who will see the patient for admission.  The patient appears reasonably stabilized for admission considering the current resources, flow, and capabilities available in the ED at this time, and I doubt any other [facility] requiring further screening and/or treatment in the ED prior to admission assuming timely admission and bed placement.  Note: Portions of this report may have been transcribed using voice recognition software. Every effort was made to ensure accuracy; however, inadvertent computerized transcription errors may be present  Final Clinical Impression(s) / ED Diagnoses Final diagnoses:  Iron deficiency anemia due to chronic blood loss    Rx / DC Orders ED Discharge Orders    None       Ollen Gross 09/10/20 2225    Carmin Muskrat, MD 09/10/20 2340

## 2020-09-11 ENCOUNTER — Observation Stay (HOSPITAL_COMMUNITY): Payer: Medicare HMO | Admitting: Certified Registered Nurse Anesthetist

## 2020-09-11 ENCOUNTER — Encounter (HOSPITAL_COMMUNITY)
Admission: EM | Disposition: A | Discharge status: Home / Self Care | Payer: Self-pay | Source: Home / Self Care | Attending: Emergency Medicine

## 2020-09-11 ENCOUNTER — Encounter (HOSPITAL_COMMUNITY): Payer: Self-pay | Admitting: Family Medicine

## 2020-09-11 DIAGNOSIS — I25811 Atherosclerosis of native coronary artery of transplanted heart without angina pectoris: Secondary | ICD-10-CM | POA: Diagnosis not present

## 2020-09-11 DIAGNOSIS — R932 Abnormal findings on diagnostic imaging of liver and biliary tract: Secondary | ICD-10-CM | POA: Diagnosis not present

## 2020-09-11 DIAGNOSIS — K7031 Alcoholic cirrhosis of liver with ascites: Secondary | ICD-10-CM | POA: Diagnosis not present

## 2020-09-11 DIAGNOSIS — K766 Portal hypertension: Secondary | ICD-10-CM | POA: Diagnosis not present

## 2020-09-11 DIAGNOSIS — J4520 Mild intermittent asthma, uncomplicated: Secondary | ICD-10-CM | POA: Diagnosis not present

## 2020-09-11 DIAGNOSIS — N1831 Chronic kidney disease, stage 3a: Secondary | ICD-10-CM | POA: Diagnosis not present

## 2020-09-11 DIAGNOSIS — I864 Gastric varices: Secondary | ICD-10-CM | POA: Diagnosis not present

## 2020-09-11 DIAGNOSIS — D509 Iron deficiency anemia, unspecified: Secondary | ICD-10-CM | POA: Diagnosis not present

## 2020-09-11 DIAGNOSIS — D696 Thrombocytopenia, unspecified: Secondary | ICD-10-CM | POA: Diagnosis not present

## 2020-09-11 DIAGNOSIS — I130 Hypertensive heart and chronic kidney disease with heart failure and stage 1 through stage 4 chronic kidney disease, or unspecified chronic kidney disease: Secondary | ICD-10-CM | POA: Diagnosis not present

## 2020-09-11 DIAGNOSIS — Z20822 Contact with and (suspected) exposure to covid-19: Secondary | ICD-10-CM | POA: Diagnosis not present

## 2020-09-11 DIAGNOSIS — K31819 Angiodysplasia of stomach and duodenum without bleeding: Secondary | ICD-10-CM | POA: Diagnosis not present

## 2020-09-11 DIAGNOSIS — E1122 Type 2 diabetes mellitus with diabetic chronic kidney disease: Secondary | ICD-10-CM | POA: Diagnosis not present

## 2020-09-11 DIAGNOSIS — I5032 Chronic diastolic (congestive) heart failure: Secondary | ICD-10-CM | POA: Diagnosis not present

## 2020-09-11 DIAGNOSIS — D649 Anemia, unspecified: Secondary | ICD-10-CM

## 2020-09-11 DIAGNOSIS — K449 Diaphragmatic hernia without obstruction or gangrene: Secondary | ICD-10-CM | POA: Diagnosis not present

## 2020-09-11 DIAGNOSIS — E118 Type 2 diabetes mellitus with unspecified complications: Secondary | ICD-10-CM | POA: Diagnosis not present

## 2020-09-11 DIAGNOSIS — D500 Iron deficiency anemia secondary to blood loss (chronic): Secondary | ICD-10-CM | POA: Diagnosis not present

## 2020-09-11 DIAGNOSIS — K921 Melena: Secondary | ICD-10-CM | POA: Diagnosis not present

## 2020-09-11 DIAGNOSIS — K3189 Other diseases of stomach and duodenum: Secondary | ICD-10-CM | POA: Diagnosis not present

## 2020-09-11 DIAGNOSIS — R69 Illness, unspecified: Secondary | ICD-10-CM | POA: Diagnosis not present

## 2020-09-11 DIAGNOSIS — K922 Gastrointestinal hemorrhage, unspecified: Secondary | ICD-10-CM | POA: Diagnosis not present

## 2020-09-11 DIAGNOSIS — I2510 Atherosclerotic heart disease of native coronary artery without angina pectoris: Secondary | ICD-10-CM | POA: Diagnosis not present

## 2020-09-11 DIAGNOSIS — N183 Chronic kidney disease, stage 3 unspecified: Secondary | ICD-10-CM | POA: Diagnosis not present

## 2020-09-11 HISTORY — PX: ESOPHAGOGASTRODUODENOSCOPY: SHX5428

## 2020-09-11 LAB — CBC
HCT: 23.6 % — ABNORMAL LOW (ref 39.0–52.0)
Hemoglobin: 6.7 g/dL — CL (ref 13.0–17.0)
MCH: 22.2 pg — ABNORMAL LOW (ref 26.0–34.0)
MCHC: 28.4 g/dL — ABNORMAL LOW (ref 30.0–36.0)
MCV: 78.1 fL — ABNORMAL LOW (ref 80.0–100.0)
Platelets: 144 10*3/uL — ABNORMAL LOW (ref 150–400)
RBC: 3.02 MIL/uL — ABNORMAL LOW (ref 4.22–5.81)
RDW: 19.6 % — ABNORMAL HIGH (ref 11.5–15.5)
WBC: 4.5 10*3/uL (ref 4.0–10.5)
nRBC: 0 % (ref 0.0–0.2)

## 2020-09-11 LAB — COMPREHENSIVE METABOLIC PANEL
ALT: 19 U/L (ref 0–44)
AST: 24 U/L (ref 15–41)
Albumin: 3.6 g/dL (ref 3.5–5.0)
Alkaline Phosphatase: 151 U/L — ABNORMAL HIGH (ref 38–126)
Anion gap: 11 (ref 5–15)
BUN: 26 mg/dL — ABNORMAL HIGH (ref 8–23)
CO2: 24 mmol/L (ref 22–32)
Calcium: 8.5 mg/dL — ABNORMAL LOW (ref 8.9–10.3)
Chloride: 103 mmol/L (ref 98–111)
Creatinine, Ser: 1.13 mg/dL (ref 0.61–1.24)
GFR, Estimated: >60 mL/min (ref >60)
Glucose, Bld: 126 mg/dL — ABNORMAL HIGH (ref 70–99)
Potassium: 3.8 mmol/L (ref 3.5–5.1)
Sodium: 138 mmol/L (ref 135–145)
Total Bilirubin: 0.8 mg/dL (ref 0.3–1.2)
Total Protein: 6.9 g/dL (ref 6.5–8.1)

## 2020-09-11 LAB — CBG MONITORING, ED
Glucose-Capillary: 140 mg/dL — ABNORMAL HIGH (ref 70–99)
Glucose-Capillary: 120 mg/dL — ABNORMAL HIGH (ref 70–99)
Glucose-Capillary: 146 mg/dL — ABNORMAL HIGH (ref 70–99)

## 2020-09-11 LAB — GLUCOSE, CAPILLARY
Glucose-Capillary: 133 mg/dL — ABNORMAL HIGH (ref 70–99)
Glucose-Capillary: 112 mg/dL — ABNORMAL HIGH (ref 70–99)

## 2020-09-11 LAB — HEMOGLOBIN AND HEMATOCRIT, BLOOD
HCT: 25.9 % — ABNORMAL LOW (ref 39.0–52.0)
Hemoglobin: 7.5 g/dL — ABNORMAL LOW (ref 13.0–17.0)

## 2020-09-11 LAB — PREPARE RBC (CROSSMATCH)

## 2020-09-11 SURGERY — EGD (ESOPHAGOGASTRODUODENOSCOPY)
Anesthesia: Monitor Anesthesia Care

## 2020-09-11 MED ORDER — LACTATED RINGERS IV SOLN
INTRAVENOUS | Status: AC | PRN
  Administered 2020-09-11: 1000 mL via INTRAVENOUS

## 2020-09-11 MED ORDER — PROPOFOL 500 MG/50ML IV EMUL
INTRAVENOUS | Status: DC | PRN
  Administered 2020-09-11: 125 ug/kg/min via INTRAVENOUS

## 2020-09-11 MED ORDER — SODIUM CHLORIDE 0.9 % IV SOLN: INTRAVENOUS | Status: DC

## 2020-09-11 MED ORDER — FUROSEMIDE 10 MG/ML IJ SOLN
20.0000 mg | Freq: Once | INTRAMUSCULAR | Status: AC
  Administered 2020-09-11: 20 mg via INTRAVENOUS
  Filled 2020-09-11: qty 4

## 2020-09-11 MED ORDER — EPHEDRINE SULFATE 50 MG/ML IJ SOLN
INTRAMUSCULAR | Status: DC | PRN
  Administered 2020-09-11: 10 mg via INTRAVENOUS

## 2020-09-11 MED ORDER — PROPOFOL 500 MG/50ML IV EMUL
INTRAVENOUS | Status: DC | PRN
  Administered 2020-09-11: 40 mg via INTRAVENOUS

## 2020-09-11 MED ORDER — SODIUM CHLORIDE 0.9% IV SOLUTION: Freq: Once | INTRAVENOUS | Status: DC

## 2020-09-11 MED ORDER — PROPOFOL 500 MG/50ML IV EMUL
INTRAVENOUS | Status: AC
  Filled 2020-09-11: qty 50

## 2020-09-11 MED ORDER — LIDOCAINE HCL (CARDIAC) PF 100 MG/5ML IV SOSY
PREFILLED_SYRINGE | INTRAVENOUS | Status: DC | PRN
  Administered 2020-09-11: 80 mg via INTRAVENOUS

## 2020-09-11 NOTE — Anesthesia Postprocedure Evaluation (Signed)
Anesthesia Post Note  Patient: [name]  Procedure(s) Performed: ESOPHAGOGASTRODUODENOSCOPY (EGD) (N/A )     Patient location during evaluation: Endoscopy Anesthesia Type: MAC Level of consciousness: awake and alert Pain management: pain level controlled Vital Signs Assessment: post-procedure vital signs reviewed and stable Respiratory status: spontaneous breathing, nonlabored ventilation, respiratory function stable and patient connected to nasal cannula oxygen Cardiovascular status: stable and blood pressure returned to baseline Postop Assessment: no apparent nausea or vomiting Anesthetic complications: no   No complications documented.  Last Vitals:  Vitals:   09/11/20 1440 09/11/20 1450  BP: (!) 122/44 129/87  Pulse: 70 70  Resp: 14 19  Temp:    SpO2: 93% 92%    Last Pain:  Vitals:   09/11/20 1450  TempSrc:   PainSc: 0-No pain                 Belenda Cruise P [place]

## 2020-09-11 NOTE — Op Note (Signed)
[facility] Patient Name: [name] Procedure Date: 09/11/2020 MRN: [id number] Attending MD: Clarene Essex , MD Date of Birth: [birth year] CSN: [id number] Age: 75 Admit Type: Inpatient Procedure:                Upper GI endoscopy Indications:              Melena Providers:                Clarene Essex, MD, Carmie End, RN, Elspeth Cho                            Tech., Technician, Arnoldo Hooker, CRNA Referring MD:              Medicines:                Propofol total dose 200 mg IV, 80 mg IV lidocaine Complications:            No immediate complications. Estimated Blood Loss:     Estimated blood loss: none. Procedure:                Pre-Anesthesia Assessment:                           - Prior to the procedure, a History and Physical                            was performed, and patient medications and                            allergies were reviewed. The patient's tolerance of                            previous anesthesia was also reviewed. The risks                            and benefits of the procedure and the sedation                            options and risks were discussed with the patient.                            All questions were answered, and informed consent                            was obtained. Prior Anticoagulants: The patient has                            taken no previous anticoagulant or antiplatelet                            agents. ASA Grade Assessment: III - A patient with                            severe systemic disease. After reviewing the risks  and benefits, the patient was deemed in                            satisfactory condition to undergo the procedure.                           After obtaining informed consent, the endoscope was                            passed under direct vision. Throughout the                            procedure, the patient's blood pressure, pulse, and                             oxygen saturations were monitored continuously. The                            GIF-H190 ([id number]) Olympus gastroscope was                            introduced through the mouth, and advanced to the                            third part of duodenum. The upper GI endoscopy was                            accomplished without difficulty. The patient                            tolerated the procedure well. Scope In: Scope Out: Findings:      The larynx was normal.      A tiny hiatal hernia was present. But no gastric varices      Type 1 isolated gastric varices (IGV1, varices located in the fundus)       with no bleeding were found in the cardia. There were no stigmata of       recent bleeding. They were small in largest diameter.      Mild portal hypertensive gastropathy was found in the cardia, in the       gastric fundus and in the gastric body.      Two? small angiodysplastic lesions versus portal hypertension       gastropathy with no bleeding were found in the gastric body.      The duodenal bulb, first portion of the duodenum, second portion of the       duodenum and third portion of the duodenum were normal.      The exam was otherwise without abnormality. Impression:               - Normal larynx.                           - Tiny hiatal hernia.                           - Type 1 isolated gastric varices (IGV1, varices  located in the fundus), without bleeding.                           - Portal hypertensive gastropathy.                           - Two non-bleeding? angiodysplastic lesions in the                            stomach.                           - Normal duodenal bulb, first portion of the                            duodenum, second portion of the duodenum and third                            portion of the duodenum.                           - The examination was otherwise normal.                           - No specimens collected. Moderate  Sedation:      Not Applicable - Patient had care per Anesthesia. Recommendation:           - Clear liquid diet today. If no signs of bleeding                            can advance tomorrow                           - Continue present medications. Consider                            beta-blockers to lower portal pressures as an                            outpatient also consider Carafate use and no                            alcohol aspirin or nonsteroidals                           - Return to GI clinic PRN. Will talk to                            interventional radiology to determine if they                            prefer a CT or liver MRI to follow-up on their                            liver cancer therapy                           -  Telephone GI clinic if symptomatic PRN. If no                            signs of bleeding tomorrow can probably wean                            octreotide consider hematology follow-up for                            considerations of IV iron or Epogen to keep                            hemoglobin increased further and consider TIPS or                            BRTO in the future and will ask Dr. Therisa Doyne to round                            on tomorrow Procedure Code(s):        --- Professional ---                           [phone], Esophagogastroduodenoscopy, flexible,                            transoral; diagnostic, including collection of                            specimen(s) by brushing or washing, when performed                            (separate procedure) Diagnosis Code(s):        --- Professional ---                           K44.9, Diaphragmatic hernia without obstruction or                            gangrene                           I86.4, Gastric varices                           K76.6, Portal hypertension                           K31.89, Other diseases of stomach and duodenum                           K31.819, Angiodysplasia of stomach  and duodenum                            without bleeding                           K92.1, Melena (includes  Hematochezia) CPT copyright 2019 [employer]. All rights reserved. The codes documented in this report are preliminary and upon coder review may  be revised to meet current compliance requirements. Clarene Essex, MD 09/11/2020 2:30:06 PM This report has been signed electronically. Number of Addenda: 0

## 2020-09-11 NOTE — Assessment & Plan Note (Signed)
No clear flare up today. Checking CMP and CBC.

## 2020-09-11 NOTE — Progress Notes (Signed)
PROGRESS NOTE  [name] BPZ:[id number] DOB: 1946/05/11 DOA: 09/10/2020 PCP: [name], MD   LOS: 0 days   Brief narrative: [name] is a 75 y.o. male with medical history significant for alcoholic liver cirrhosis with ascites, chronic diastolic CHF, chronic kidney disease stage II, iron deficiency anemia, type 2 diabetes mellitus, hepatocellular carcinoma, coronary artery disease, and recent admission for upper GI bleed, presented to the ED to the emergency department at the direction of his PCP for evaluation of low hemoglobin. Patient was discharged from the hospital roughly 3 weeks ago after admission for upper GI bleed requiring blood transfusion and complicated by decompensated cirrhosis and heart failure. He had blood work performed for hospital follow-up visit, hemoglobin was 6.5, and he was directed to the ED. Upon arrival to the ED, patient is was afebrile, saturating well on room air, and with stable blood pressure. Chemistry panel notable for BUN of 32 and creatinine 1.09. CBC features a hemoglobin of 6.3, down from 7.4 three weeks ago. COVID-19 PCR was negative and fecal occult blood testing positive. Patient was given IV Protonix, PRBC and octreotide and was admitted to hospital.  GI was consulted.  Assessment/Plan:  Principal Problem:   GI bleeding Active Problems:   Type 2 diabetes with complication (HCC)   Coronary artery disease involving native coronary artery of native heart without angina pectoris   Hepatic cirrhosis (HCC)   CKD (chronic kidney disease), stage III (HCC)   (HFpEF) heart failure with preserved ejection fraction (HCC)   Asthma, mild intermittent   Alcoholic cirrhosis of liver with ascites (HCC)   Symptomatic anemia   Iron deficiency anemia  Iron deficiency anemia secondary to GI bleed. Patient presented with presents with Hgb 6.3, down from 7.4 three weeks ago, FOBT was positive.  EGD from a month PACs with a grade 1 esophageal varices  with portal hypertensive gastropathy and friable gastric mucosa and type I gastric varices.  Continue IV Protonix, 1 unit of PRBC.  Continue octreotide.  Prophylactic Rocephin.   GI has been consulted for possible EGD.  NPO.  Alcoholic cirrhosis with ascites; hepatocellular carcinoma   HCC s/p chemoembolization in November '21.  Patient continues to drink occasional beer.  GI has been consulted.  Chronic diastolic CHF  Strict intake and output charting Daily weights.  Does have peripheral edema but no dyspnea.  Currently compensated.  Received 1 unit of PRBC with Lasix.  Type II DM  - Hemoglobin A1c was 5.3% a month ago; sliding scale insulin as needed.  CKD II  - SCr is 1.09 admission, appears to be baseline.  Monitor BMP closely.  Asthma  Continue Anoro and prn albuterol  .  Compensated.  CAD  No chest pain or anginal symptoms.  Continue statin.  DVT prophylaxis: SCDs Start: 09/10/20 2302  Code Status: Full code  Family Communication: None  Status is: Observation  The patient will require care spanning > 2 midnights and should be moved to inpatient because: IV treatments appropriate due to intensity of illness or inability to take PO and Inpatient level of care appropriate due to severity of illness  Dispo: The patient is from: Home              Anticipated d/c is to: Home              Anticipated d/c date is: 2 days              Patient currently is not medically stable  to d/c.  Consultants:  GI  Procedures:  PRBC transfusion  Anti-infectives:  . Prophylactic Rocephin IV  Anti-infectives (From admission, onward)   Start     Dose/Rate Route Frequency Ordered Stop   09/10/20 2315  cefTRIAXone (ROCEPHIN) 1 g in sodium chloride 0.9 % 100 mL IVPB        1 g 200 mL/hr over 30 Minutes Intravenous Every 24 hours 09/10/20 2305         Subjective: Today, patient was seen and examined at bedside.  Denies nausea vomiting abdominal pain or gross hematemesis.  He  does complain of black stools at home but takes iron as well.  Denies chest pain dizziness lightheadedness   Objective: Vitals:   09/11/20 0730 09/11/20 0737  BP: (!) 128/51 (!) 108/56  Pulse: 66 67  Resp: 19 14  Temp: 98.1 F (36.7 C)   SpO2: 95% 99%    Intake/Output Summary (Last 24 hours) at 09/11/2020 0825 Last data filed at 09/11/2020 0121 Gross per 24 hour  Intake 730 ml  Output --  Net 730 ml   There were no vitals filed for this visit. There is no height or weight on file to calculate BMI.   Physical Exam: .bmi GENERAL: Patient is alert awake and oriented. Not in obvious distress. HENT: Oral pallor noted.  Pupils equally reactive to light. Oral mucosa is moist NECK: is supple, no gross swelling noted. CHEST: Clear to auscultation. No crackles or wheezes.  Diminished breath sounds bilaterally. CVS: S1 and S2 heard, no murmur. Regular rate and rhythm.  ABDOMEN: Soft,  distended abdomen mild ascites nontender, bowel sounds are present. EXTREMITIES: Bilateral lower extremity edema ++ with discoloration of the skin CNS: Cranial nerves are intact. No focal motor deficits. SKIN: warm and dry   Data Review: I have personally reviewed the following laboratory data and studies,  CBC: Recent Labs  Lab 09/10/20 1442 09/10/20 1833 09/11/20 0220  WBC 5.0 4.9 4.5  NEUTROABS  --  3.9  --   HGB 6.5 Repeated and verified X2.* 6.3* 6.7*  HCT 21.3 Repeated and verified X2.* 23.2* 23.6*  MCV 70.6* 77.6* 78.1*  PLT 150.0 157 785*   Basic Metabolic Panel: Recent Labs  Lab 09/10/20 1442 09/10/20 1833 09/11/20 0322  NA 137 139 138  K 3.9 4.1 3.8  CL 103 104 103  CO2 27 25 24   GLUCOSE 151* 193* 126*  BUN 31* 32* 26*  CREATININE 1.12 1.09 1.13  CALCIUM 8.9 8.6* 8.5*   Liver Function Tests: Recent Labs  Lab 09/10/20 1442 09/10/20 1833 09/11/20 0322  AST 21 25 24   ALT 18 21 19   ALKPHOS 170* 171* 151*  BILITOT 0.5 0.5 0.8  PROT 7.0 7.3 6.9  ALBUMIN 4.0 3.7 3.6    No results for input(s): LIPASE, AMYLASE in the last 168 hours. No results for input(s): AMMONIA in the last 168 hours. Cardiac Enzymes: No results for input(s): CKTOTAL, CKMB, CKMBINDEX, TROPONINI in the last 168 hours. BNP (last 3 results) Recent Labs    04/15/20 1539 08/09/20 1926 08/14/20 0353  BNP 293.0* 326.3* 414.2*    ProBNP (last 3 results) No results for input(s): PROBNP in the last 8760 hours.  CBG: Recent Labs  Lab 09/10/20 2352 09/11/20 0430 09/11/20 0736  GLUCAP 89 140* 120*   Recent Results (from the past 240 hour(s))  Resp Panel by RT-PCR (Flu A&B, Covid) Nasopharyngeal Swab     Status: None   Collection Time: 09/10/20  8:36 PM  Specimen: Nasopharyngeal Swab; Nasopharyngeal(NP) swabs in vial transport medium  Result Value Ref Range Status   SARS Coronavirus 2 by RT PCR NEGATIVE NEGATIVE Final    Comment: (NOTE) SARS-CoV-2 target nucleic acids are NOT DETECTED.  The SARS-CoV-2 RNA is generally detectable in upper respiratory specimens during the acute phase of infection. The lowest concentration of SARS-CoV-2 viral copies this assay can detect is 138 copies/mL. A negative result does not preclude SARS-Cov-2 infection and should not be used as the sole basis for treatment or other patient management decisions. A negative result may occur with  improper specimen collection/handling, submission of specimen other than nasopharyngeal swab, presence of viral mutation(s) within the areas targeted by this assay, and inadequate number of viral copies(<138 copies/mL). A negative result must be combined with clinical observations, patient history, and epidemiological information. The expected result is Negative.  Fact Sheet for Patients:  EntrepreneurPulse.com.au  Fact Sheet for Healthcare Providers:  IncredibleEmployment.be  This test is no t yet approved or cleared by the [place] FDA and  has been authorized for  detection and/or diagnosis of SARS-CoV-2 by FDA under an Emergency Use Authorization (EUA). This EUA will remain  in effect (meaning this test can be used) for the duration of the COVID-19 declaration under Section 564(b)(1) of the Act, 21 U.S.C.section 360bbb-3(b)(1), unless the authorization is terminated  or revoked sooner.       Influenza A by PCR NEGATIVE NEGATIVE Final   Influenza B by PCR NEGATIVE NEGATIVE Final    Comment: (NOTE) The Xpert Xpress SARS-CoV-2/FLU/RSV plus assay is intended as an aid in the diagnosis of influenza from Nasopharyngeal swab specimens and should not be used as a sole basis for treatment. Nasal washings and aspirates are unacceptable for Xpert Xpress SARS-CoV-2/FLU/RSV testing.  Fact Sheet for Patients: EntrepreneurPulse.com.au  Fact Sheet for Healthcare Providers: IncredibleEmployment.be  This test is not yet approved or cleared by the [place] FDA and has been authorized for detection and/or diagnosis of SARS-CoV-2 by FDA under an Emergency Use Authorization (EUA). This EUA will remain in effect (meaning this test can be used) for the duration of the COVID-19 declaration under Section 564(b)(1) of the Act, 21 U.S.C. section 360bbb-3(b)(1), unless the authorization is terminated or revoked.  Performed at [facility], [place] [address] [place],  [address]      Studies: No results found.    Flora Lipps, MD  [facility] 09/11/2020  If 7PM-7AM, please contact night-coverage

## 2020-09-11 NOTE — ED Notes (Signed)
Unable to draw 0500 CMP due to blood infusing.

## 2020-09-11 NOTE — Transfer of Care (Signed)
Immediate Anesthesia Transfer of Care Note  Patient: [name]  Procedure(s) Performed: Procedure(s): ESOPHAGOGASTRODUODENOSCOPY (EGD) (N/A)  Patient Location: PACU  Anesthesia Type:MAC  Level of Consciousness:  sedated, patient cooperative and responds to stimulation  Airway & Oxygen Therapy:Patient Spontanous Breathing and Patient connected to face mask oxgen  Post-op Assessment:  Report given to PACU RN and Post -op Vital signs reviewed and stable  Post vital signs:  Reviewed and stable  Last Vitals:  Vitals:   09/11/20 1043 09/11/20 1327  BP: (!) 142/75 (!) 179/74  Pulse: 70 64  Resp: 16 17  Temp:  36.8 C  SpO2: 48% 01%    Complications: No apparent anesthesia complications

## 2020-09-11 NOTE — Progress Notes (Signed)
Received report from ED RN, ready to receive patient.

## 2020-09-11 NOTE — ED Notes (Signed)
MD Opdy notified of pt's new hgb level of 6.7.

## 2020-09-11 NOTE — Assessment & Plan Note (Signed)
Checking CBC for follow up. Hg level around 7-8 when he left hospital.

## 2020-09-11 NOTE — Assessment & Plan Note (Signed)
Recheck CMP, weights stable.

## 2020-09-11 NOTE — Consult Note (Addendum)
Referring Provider: Dr. Myna Hidalgo [facility]) Primary Care Physician:  [name], MD Primary Gastroenterologist:  [facility] (prior patient of Dr. Penelope Coop)  Reason for Consultation:  Melena, anemia; history of cirrhosis, portal hypertensive gastropathy, and grade 1 esophageal varices  HPI: [name] is a 75 y.o. male with past medical history noted below to include CAD s/p CABG, HFpEF (EF 55-60% as of 07/2019), hepatic cirrhosis, history of GI bleed with (gastric AVMs in 2019, portal hypertensive gastropathy and grade 1 esophageal varices in 2021), and GERD/Barrett's esophagus presenting for consultation of symptomatic anemia.  Patient presented to the ED yesterday after PCP completed labs with Hgb 6.5.  Patient reports firm, melenic stools for 2 days. Denies nausea, vomiting, hematemesis, GERD, dysphagia, abdominal pain, early satiety, unexplained weight loss, diarrhea, or hematochezia.  He denies chest pain or shortness of breath.  Denies blood thinner, ASA, or NSAID use.  Denies family history of colon cancer or gastrointestinal malignancy.  He had 1 beer yesterday but otherwise denies alcohol use since his last hospitalization in December 2021.  EGD 07/2020 was pertinent for portal hypertensive gastropathy, friable gastric mucosa, Grade I esophageal varices, and type 1 isolated gastric varices (IGV1, varices located in the fundus), without bleeding.  EGD was 11/2017 and was pertinent for 5 nonbleeding AVMs s/p APC as well as nonbleeding duodenal diverticulum.  Last colonoscopy was 07/2013 was pertinent for 2 nonbleeding colonic AVMs and 3 hyperplastic polyps.   Past Medical History:  Diagnosis Date  . Arthritis    "joints tighten up sometimes" (03/27/2104)  . Carcinoma of liver, hepatocellular ([place]) 06/30/2020  . CHF (congestive heart failure) ([place])   . Chronic lower back pain   . Coronary artery disease    a. 05/2012 Cath/PCI: LM 40ost, 50-60d, LAD 99p ruptured plaque (3.0x28 DES),  LCX 50p/m, RCA 30-40p, 74m EF 65-70%.  . Coronary artery disease involving native coronary artery of native heart without angina pectoris    Severe left main disease at catheterization July 2015  CABG x3 with a LIMA to the LAD, SVG to the OM, SVG to the PDA on 03/31/14. EF 60% by cath.   . Diastolic heart failure ([place]   . GERD without esophagitis 08/04/2010  . Hepatic cirrhosis ([place]    a. Dx 01/2014 - CT a/p   . History of blood transfusion    "related to bleeding ulcers"  . History of concussion    1976--  NO RESIDUAL  . History of GI bleed    a. UGIB 07/2012;  b. 01/2014 admission with GIB/FOB stool req 1U prbc's->EGD showed portal gastropathy, barrett's esoph, and chronic active h. pylori gastritis.  .Marland KitchenHistory of gout    2007 &  2008  LEFT LEG-- NO ISSUE SINCE  . Hyperlipidemia   . Iron deficiency anemia   . Kidney stones   . OA (osteoarthritis of spine)    LOWER BACK--  INTERMITTANT LEFT LEG NUMBNESS  . OSA (obstructive sleep apnea)    PULMOLOGIST-  DR CLANCE--  MODERATE OSA  STARTED CPAP 2012--  BUT CURRENTLY HAS NOT USED PAST 6 MONTHS  . Phimosis    a. s/p circumcision 2015.  . Type 2 diabetes mellitus ([place]   . Unspecified essential hypertension     Past Surgical History:  Procedure Laterality Date  . ANKLE FRACTURE SURGERY Right 1989   "plate put in"  . APPENDECTOMY  05-16-2004   open  . CIRCUMCISION N/A 09/09/2013   Procedure: CIRCUMCISION ADULT;  Surgeon: DBernestine Amass  MD;  Location: [place];  Service: Urology;  Laterality: N/A;  . COLECTOMY  05-16-2004  . CORONARY ANGIOPLASTY WITH STENT PLACEMENT  06/28/2012  DR COOPER   PCI W/  X1 DES to [place]. LAD/  LM  40% OSTIAL & 50-60% DISTAL /  50% PROX LCX/  30-40% PROX RCA & 50% MID RCA/   LVEF 65-70%  . CORONARY ARTERY BYPASS GRAFT N/A 03/31/2014   Procedure: CORONARY ARTERY BYPASS GRAFTING (CABG) times 3 using left internal mammary artery and right saphenous vein.;  Surgeon: Melrose Nakayama, MD;   Location: [place];  Service: Open Heart Surgery;  Laterality: N/A;  . DOBUTAMINE STRESS ECHO  06-08-2012   MODERATE HYPOKINESIS/ ISCHEMIA MID INFERIOR WALL  . ESOPHAGOGASTRODUODENOSCOPY  08/15/2012   Procedure: ESOPHAGOGASTRODUODENOSCOPY (EGD);  Surgeon: Wonda Horner, MD;  Location[facility] ENDOSCOPY;  Service: Endoscopy;  Laterality: N/A;  . ESOPHAGOGASTRODUODENOSCOPY N/A 02/17/2014   Procedure: ESOPHAGOGASTRODUODENOSCOPY (EGD);  Surgeon: Jeryl Columbia, MD;  Location[facility] ENDOSCOPY;  Service: Endoscopy;  Laterality: N/A;  . ESOPHAGOGASTRODUODENOSCOPY N/A 04/17/2020   Procedure: ESOPHAGOGASTRODUODENOSCOPY (EGD);  Surgeon: Ronnette Juniper, MD;  Location: [place];  Service: Gastroenterology;  Laterality: N/A;  . ESOPHAGOGASTRODUODENOSCOPY (EGD) WITH PROPOFOL N/A 09/30/2017   Procedure: ESOPHAGOGASTRODUODENOSCOPY (EGD) WITH PROPOFOL;  Surgeon: Wonda Horner, MD;  Location: [facility] ENDOSCOPY;  Service: Endoscopy;  Laterality: N/A;  . ESOPHAGOGASTRODUODENOSCOPY (EGD) WITH PROPOFOL N/A 12/20/2017   Procedure: ESOPHAGOGASTRODUODENOSCOPY (EGD) WITH PROPOFOL;  Surgeon: Clarene Essex, MD;  Location: [place];  Service: Endoscopy;  Laterality: N/A;  . ESOPHAGOGASTRODUODENOSCOPY (EGD) WITH PROPOFOL N/A 08/10/2020   Procedure: ESOPHAGOGASTRODUODENOSCOPY (EGD) WITH PROPOFOL;  Surgeon: Wilford Corner, MD;  Location: [place];  Service: Endoscopy;  Laterality: N/A;  . HERNIA REPAIR    . INTRAOPERATIVE TRANSESOPHAGEAL ECHOCARDIOGRAM N/A 03/31/2014   Procedure: INTRAOPERATIVE TRANSESOPHAGEAL ECHOCARDIOGRAM;  Surgeon: Melrose Nakayama, MD;  Location: [place];  Service: Open Heart Surgery;  Laterality: N/A;  . IR ANGIOGRAM SELECTIVE EACH ADDITIONAL VESSEL  06/16/2020  . IR ANGIOGRAM SELECTIVE EACH ADDITIONAL VESSEL  06/16/2020  . IR ANGIOGRAM SELECTIVE EACH ADDITIONAL VESSEL  06/16/2020  . IR ANGIOGRAM SELECTIVE EACH ADDITIONAL VESSEL  06/16/2020  . IR ANGIOGRAM SELECTIVE EACH ADDITIONAL VESSEL  06/16/2020  . IR  ANGIOGRAM SELECTIVE EACH ADDITIONAL VESSEL  06/16/2020  . IR ANGIOGRAM SELECTIVE EACH ADDITIONAL VESSEL  06/16/2020  . IR ANGIOGRAM SELECTIVE EACH ADDITIONAL VESSEL  06/16/2020  . IR ANGIOGRAM SELECTIVE EACH ADDITIONAL VESSEL  06/16/2020  . IR ANGIOGRAM SELECTIVE EACH ADDITIONAL VESSEL  06/30/2020  . IR ANGIOGRAM SELECTIVE EACH ADDITIONAL VESSEL  06/30/2020  . IR ANGIOGRAM VISCERAL SELECTIVE  06/16/2020  . IR ANGIOGRAM VISCERAL SELECTIVE  06/30/2020  . IR EMBO ARTERIAL NOT HEMORR HEMANG INC GUIDE ROADMAPPING  06/16/2020  . IR EMBO TUMOR ORGAN ISCHEMIA INFARCT INC GUIDE ROADMAPPING  06/30/2020  . IR RADIOLOGIST EVAL & MGMT  05/06/2020  . IR RADIOLOGIST EVAL & MGMT  05/20/2020  . IR US GUIDE VASC ACCESS RIGHT  06/16/2020  . IR US GUIDE VASC ACCESS RIGHT  06/30/2020  . LAPAROSCOPIC UMBILICAL HERNIA REPAIR W/ MESH  06-06-2011  . LEFT HEART CATHETERIZATION WITH CORONARY ANGIOGRAM N/A 03/28/2014   Procedure: LEFT HEART CATHETERIZATION WITH CORONARY ANGIOGRAM;  Surgeon: Sinclair Grooms, MD;  Location: [facility] CATH LAB;  Service: Cardiovascular;  Laterality: N/A;  . LIPOMA EXCISION Left 08/25/2017   Procedure: EXCISION LIPOMA LEFT POSTERIOR THIGH;  Surgeon: Clovis Riley, MD;  Location: [place];  Service: General;  Laterality: Left;  .  NEPHROLITHOTOMY  1990'S  . OPEN APPENDECTOMY W/ PARTIAL CECECTOMY  05-16-2004  . PERCUTANEOUS CORONARY STENT INTERVENTION (PCI-S) N/A 06/28/2012   Procedure: PERCUTANEOUS CORONARY STENT INTERVENTION (PCI-S);  Surgeon: Sherren Mocha, MD;  Location: [facility] CATH LAB;  Service: Cardiovascular;  Laterality: N/A;    Prior to Admission medications   Medication Sig Start Date End Date Taking? Authorizing Provider  albuterol (VENTOLIN HFA) 108 (90 Base) MCG/ACT inhaler Inhale 2 puffs into the lungs every 6 (six) hours as needed for wheezing or shortness of breath. 03/12/20  Yes [name], MD  atorvastatin (LIPITOR) 40 MG tablet TAKE 1 TABLET BY MOUTH DAILY AT 6 PM Patient  taking differently: Take by mouth daily. 05/13/20  Yes [name], MD  gabapentin (NEURONTIN) 100 MG capsule TAKE 1 CAPSULE(100 MG) BY MOUTH TWICE DAILY Patient taking differently: Take 100 mg by mouth 2 (two) times daily. 08/03/20  Yes [name], MD  glimepiride (AMARYL) 2 MG tablet Take 2 mg by mouth daily. 07/13/20  Yes [provider]  metFORMIN (GLUCOPHAGE) 850 MG tablet TAKE 1 TABLET(850 MG) BY MOUTH TWICE DAILY WITH A MEAL Patient taking differently: Take 850 mg by mouth 2 (two) times daily with a meal. 06/16/20  Yes [name], MD  nitroGLYCERIN (NITROSTAT) 0.4 MG SL tablet Place 1 tablet (0.4 mg total) under the tongue every 5 (five) minutes as needed for chest pain. 06/08/16  Yes Nahser, Wonda Cheng, MD  pantoprazole (PROTONIX) 40 MG tablet Take 1 tablet (40 mg total) by mouth daily. 08/18/20  Yes Danford, Suann Larry, MD  potassium chloride (KLOR-CON) 20 MEQ tablet Take 1 tablet (20 mEq total) by mouth daily. 08/18/20  Yes Danford, Suann Larry, MD  Sennosides (EX-LAX PO) Take 1 tablet by mouth daily as needed (constipation).   Yes [provider]  spironolactone (ALDACTONE) 25 MG tablet Take 1 tablet (25 mg total) by mouth daily. 08/18/20  Yes Danford, Suann Larry, MD  torsemide (DEMADEX) 20 MG tablet Take 2 tablets (40 mg total) by mouth 2 (two) times daily. 08/18/20  Yes Danford, Suann Larry, MD  umeclidinium-vilanterol (ANORO ELLIPTA) 62.5-25 MCG/INH AEPB Inhale 1 puff into the lungs daily. 03/12/20  Yes [name], MD  Accu-Chek Softclix Lancets lancets USE AS DIRECTED TO TEST BLOOD SUGAR FOUR TIMES DAILY 03/13/20   [name], MD  blood glucose meter kit and supplies Dispense based on patient and insurance preference. Use up to four times daily as directed. (FOR ICD-10 E10.9, E11.9). 03/12/20   [name], MD  ferrous sulfate 325 (65 FE) MG EC tablet Take 1 tablet (325 mg total) by mouth daily with  breakfast. 01/16/20   [name], MD    Scheduled Meds: . sodium chloride   Intravenous Once  . atorvastatin  40 mg Oral QPM  . insulin aspart  0-9 Units Subcutaneous Q4H  . pantoprazole (PROTONIX) IV  40 mg Intravenous Q12H  . umeclidinium-vilanterol  1 puff Inhalation Daily   Continuous Infusions: . sodium chloride Stopped (09/10/20 2239)  . sodium chloride    . cefTRIAXone (ROCEPHIN)  IV Stopped (09/11/20 0059)  . octreotide  (SANDOSTATIN)    IV infusion 50 mcg/hr (09/10/20 2349)   PRN Meds:.sodium chloride, albuterol, ondansetron **OR** ondansetron (ZOFRAN) IV  Allergies as of 09/10/2020  . (No Known Allergies)    Family History  Problem Relation Age of Onset  . Lung cancer Sister   . Cancer Sister        lung  .  Cancer Mother   . Cancer Father        died in his 18s.  . Cancer Brother        lung  . Coronary artery disease Other   . Diabetes Other   . Colon cancer Other   . Cancer Sister        lung    Social History   Socioeconomic History  . Marital status: Married    Spouse name: Not on file  . Number of children: Not on file  . Years of education: Not on file  . Highest education level: Not on file  Occupational History  . Not on file  Tobacco Use  . Smoking status: Former Smoker    Packs/day: 2.00    Years: 40.00    Pack years: 80.00    Types: Cigarettes    Quit date: 08/29/1996    Years since quitting: 24.0  . Smokeless tobacco: Never Used  Vaping Use  . Vaping Use: Never used  Substance and Sexual Activity  . Alcohol use: Yes    Alcohol/week: 8.0 standard drinks    Types: 8 Cans of beer per week    Comment: 2 beers every other day  . Drug use: No  . Sexual activity: Yes  Other Topics Concern  . Not on file  Social History Narrative   Lives in [place] with wife, son, and son's family.   Social Determinants of Health   Financial Resource Strain: Not on file  Food Insecurity: Not on file  Transportation Needs: Not on file   Physical Activity: Not on file  Stress: Not on file  Social Connections: Not on file  Intimate Partner Violence: Not on file    Review of Systems: Review of Systems  Constitutional: Negative for chills and fever.  HENT: Negative for hearing loss and tinnitus.   Eyes: Negative for pain and redness.  Respiratory: Negative for cough and shortness of breath.   Cardiovascular: Negative for chest pain and palpitations.  Gastrointestinal: Positive for melena. Negative for abdominal pain, blood in stool, constipation, diarrhea, heartburn, nausea and vomiting.  Genitourinary: Negative for flank pain and hematuria.  Musculoskeletal: Negative for falls and joint pain.  Skin: Negative for itching and rash.  Neurological: Negative for seizures and loss of consciousness.  Endo/Heme/Allergies: Negative for polydipsia. Does not bruise/bleed easily.  Psychiatric/Behavioral: Negative for substance abuse. The patient is not nervous/anxious.      Physical Exam: Vital signs: Vitals:   09/11/20 0730 09/11/20 0737  BP: (!) 128/51 (!) 108/56  Pulse: 66 67  Resp: 19 14  Temp: 98.1 F (36.7 C)   SpO2: 95% 99%     Physical Exam Vitals reviewed.  Constitutional:      General: He is not in acute distress.    Appearance: He is obese.  HENT:     Head: Normocephalic and atraumatic.     Nose: Nose normal. No congestion.     Mouth/Throat:     Mouth: Mucous membranes are moist.     Pharynx: Oropharynx is clear.  Eyes:     General: No scleral icterus.    Extraocular Movements: Extraocular movements intact.     Conjunctiva/sclera: Conjunctivae normal.  Cardiovascular:     Rate and Rhythm: Normal rate and regular rhythm.     Pulses: Normal pulses.  Pulmonary:     Effort: Pulmonary effort is normal. No respiratory distress.  Abdominal:     General: Bowel sounds are normal. There is no distension.  Palpations: Abdomen is soft. There is no mass.     Tenderness: There is no abdominal tenderness.  There is no guarding or rebound.     Hernia: No hernia is present.  Musculoskeletal:        General: No tenderness.     Cervical back: Normal range of motion and neck supple.     Right lower leg: Edema present.     Left lower leg: Edema present.  Skin:    General: Skin is warm and dry.     Coloration: Skin is not jaundiced.  Neurological:     General: No focal deficit present.     Mental Status: He is oriented to person, place, and time. He is lethargic.  Psychiatric:        Mood and Affect: Mood normal.        Behavior: Behavior normal. Behavior is cooperative.     GI:  Lab Results: Recent Labs    09/10/20 1442 09/10/20 1833 09/11/20 0220  WBC 5.0 4.9 4.5  HGB 6.5 Repeated and verified X2.* 6.3* 6.7*  HCT 21.3 Repeated and verified X2.* 23.2* 23.6*  PLT 150.0 157 144*   BMET Recent Labs    09/10/20 1442 09/10/20 1833 09/11/20 0322  NA 137 139 138  K 3.9 4.1 3.8  CL 103 104 103  CO2 '27 25 24  ' GLUCOSE 151* 193* 126*  BUN 31* 32* 26*  CREATININE 1.12 1.09 1.13  CALCIUM 8.9 8.6* 8.5*   LFT Recent Labs    09/11/20 0322  PROT 6.9  ALBUMIN 3.6  AST 24  ALT 19  ALKPHOS 151*  BILITOT 0.8   PT/INR Recent Labs    09/10/20 1833  LABPROT 13.5  INR 1.1     Studies/Results: No results found.  Impression: Suspected Upper GI bleeding: history of portal hypertensive gastropathy, Grade 1 esophageal varices, and isolated gastric varix per EGD 07/2020.  History of gastric AVMs per EGD in 2019.   -Hgb 6.3 on arrival 1/13, 6.7 after 1 u pRBCs.  Patient received a second unit on pRBCs with post-transfusion Hgb pending -BUN elevated to 31 on arrival, elevated as compared to baseline, with Cr stable as compared to baseline, suggestive of upper GI bleeding -Iron deficiency: Ferritin 6; iron 16%, iron saturation 3%, TIBC elevated to 583  Hepatocellular carcinoma s/p radiation.  During prior hospitalization, he did not have persistent bleeding and was found to have [place],  so BRTO was deferred.    Hepatic cirrhosis, presumably alcohol-related -T. Bili 0.8/ AST 24/ ALT 19/ ALP 151 as of 09/11/20 -INR 1.1 as of 09/10/20 -Platelets mildly decreased (144 K/uL as of 09/11/20)  Plan: EGD today.  I thoroughly discussed the procedure with the patient to include nature, alternatives, benefits, and risks (including but not limited to bleeding, infection, perforation, anesthesia/cardiac and pulmonary complications).  Patient verbalized understanding gave verbal consent to proceed with EGD.  Ice chips OK for now, strict NPO at 11:30 am.  Continue Octreotide infusion and Protonix 40 mg IV BID.  Continue to monitor H&H with transfusion as needed to maintain hemoglobin >7.  Will contact IR to determine if patient is a candidate for BRTO or TIPS if he has continued bleeding.  [facility] will follow.    LOS: 0 days   Salley Slaughter  PA-C 09/11/2020, 8:54 AM  Contact #  [phone]

## 2020-09-11 NOTE — Anesthesia Preprocedure Evaluation (Addendum)
Anesthesia Evaluation  Patient identified by MRN, date of birth, ID band Patient awake    Reviewed: NPO status , Patient's Chart, lab work & pertinent test results  Airway Mallampati: II  TM Distance: >3 FB Neck ROM: Full    Dental  (+) Edentulous Upper, Edentulous Lower   Pulmonary asthma , sleep apnea and Continuous Positive Airway Pressure Ventilation , former smoker,    Pulmonary exam normal        Cardiovascular hypertension, Pt. on medications + CAD and +CHF   Rhythm:Regular Rate:Normal     Neuro/Psych negative neurological ROS  negative psych ROS   GI/Hepatic PUD, GERD  Medicated,(+) Cirrhosis     substance abuse  alcohol use, HCC   Endo/Other  diabetes, Type 2, Oral Hypoglycemic Agents  Renal/GU CRFRenal disease  negative genitourinary   Musculoskeletal  (+) Arthritis , Osteoarthritis,    Abdominal (+)  Abdomen: soft. Bowel sounds: normal.  Peds  Hematology  (+) anemia ,   Anesthesia Other Findings   Reproductive/Obstetrics                            Anesthesia Physical Anesthesia Plan  ASA: III  Anesthesia Plan: MAC   Post-op Pain Management:    Induction: Intravenous  PONV Risk Score and Plan: 1 and Propofol infusion  Airway Management Planned: Simple Face Mask, Natural Airway and Nasal Cannula  Additional Equipment: None  Intra-op Plan:   Post-operative Plan:   Informed Consent: I have reviewed the patients History and Physical, chart, labs and discussed the procedure including the risks, benefits and alternatives for the proposed anesthesia with the patient or authorized representative who has indicated his/her understanding and acceptance.     Dental advisory given  Plan Discussed with: CRNA  Anesthesia Plan Comments: (ECHO 09/21: 1. Left ventricular ejection fraction, by estimation, is 60 to 65%. The  left ventricle has normal function. The left  ventricle has no regional  wall motion abnormalities. Left ventricular diastolic parameters are  consistent with Grade III diastolic  dysfunction (restrictive).  2. Right ventricular systolic function is normal. The right ventricular  size is normal. There is mildly elevated pulmonary artery systolic  pressure. The estimated right ventricular systolic pressure is 98.9 mmHg.  3. Left atrial size was mildly dilated.  4. Right atrial size was mild to moderately dilated.  5. The mitral valve is normal in structure. No evidence of mitral valve  regurgitation. The mean mitral valve gradient is 3.0 mmHg with average  heart rate of 69 bpm. Moderate mitral annular calcification.  6. The aortic valve is tricuspid. There is moderate calcification of the  aortic valve. Aortic valve regurgitation is trivial. Mild aortic valve  stenosis. Aortic valve mean gradient measures 10.0 mmHg.  7. Aortic dilatation noted. There is mild dilatation of the ascending  aorta, measuring 42 mm.  8. The inferior vena cava is dilated in size with >50% respiratory  variability, suggesting right atrial pressure of 8 mmHg.  Lab Results      Component                Value               Date                      WBC  4.5                 09/11/2020                HGB                      7.5 (L)             09/11/2020                HCT                      25.9 (L)            09/11/2020                MCV                      78.1 (L)            09/11/2020                PLT                      144 (L)             09/11/2020           Lab Results      Component                Value               Date                      NA                       138                 09/11/2020                K                        3.8                 09/11/2020                CO2                      24                  09/11/2020                GLUCOSE                  126 (H)             09/11/2020                 BUN                      26 (H)              09/11/2020                CREATININE               1.13                09/11/2020  CALCIUM                  8.5 (L)             09/11/2020                GFRNONAA                 >60                 09/11/2020                GFRAA                    59 (L)              08/25/2020          )       Anesthesia Quick Evaluation

## 2020-09-12 DIAGNOSIS — R69 Illness, unspecified: Secondary | ICD-10-CM | POA: Diagnosis not present

## 2020-09-12 DIAGNOSIS — E118 Type 2 diabetes mellitus with unspecified complications: Secondary | ICD-10-CM | POA: Diagnosis not present

## 2020-09-12 DIAGNOSIS — K922 Gastrointestinal hemorrhage, unspecified: Secondary | ICD-10-CM | POA: Diagnosis not present

## 2020-09-12 DIAGNOSIS — I2510 Atherosclerotic heart disease of native coronary artery without angina pectoris: Secondary | ICD-10-CM | POA: Diagnosis not present

## 2020-09-12 DIAGNOSIS — I5032 Chronic diastolic (congestive) heart failure: Secondary | ICD-10-CM | POA: Diagnosis not present

## 2020-09-12 DIAGNOSIS — D500 Iron deficiency anemia secondary to blood loss (chronic): Secondary | ICD-10-CM | POA: Diagnosis not present

## 2020-09-12 DIAGNOSIS — N1831 Chronic kidney disease, stage 3a: Secondary | ICD-10-CM | POA: Diagnosis not present

## 2020-09-12 DIAGNOSIS — K7031 Alcoholic cirrhosis of liver with ascites: Secondary | ICD-10-CM | POA: Diagnosis not present

## 2020-09-12 DIAGNOSIS — J4520 Mild intermittent asthma, uncomplicated: Secondary | ICD-10-CM | POA: Diagnosis not present

## 2020-09-12 DIAGNOSIS — D649 Anemia, unspecified: Secondary | ICD-10-CM | POA: Diagnosis not present

## 2020-09-12 LAB — COMPREHENSIVE METABOLIC PANEL
ALT: 18 U/L (ref 0–44)
AST: 21 U/L (ref 15–41)
Albumin: 3.8 g/dL (ref 3.5–5.0)
Alkaline Phosphatase: 144 U/L — ABNORMAL HIGH (ref 38–126)
Anion gap: 9 (ref 5–15)
BUN: 22 mg/dL (ref 8–23)
CO2: 25 mmol/L (ref 22–32)
Calcium: 8.8 mg/dL — ABNORMAL LOW (ref 8.9–10.3)
Chloride: 102 mmol/L (ref 98–111)
Creatinine, Ser: 1.1 mg/dL (ref 0.61–1.24)
GFR, Estimated: >60 mL/min (ref >60)
Glucose, Bld: 110 mg/dL — ABNORMAL HIGH (ref 70–99)
Potassium: 4.4 mmol/L (ref 3.5–5.1)
Sodium: 136 mmol/L (ref 135–145)
Total Bilirubin: 1.1 mg/dL (ref 0.3–1.2)
Total Protein: 7.2 g/dL (ref 6.5–8.1)

## 2020-09-12 LAB — TYPE AND SCREEN
ABO/RH(D): O POS
Antibody Screen: NEGATIVE
Unit division: 0
Unit division: 0

## 2020-09-12 LAB — CBC
HCT: 27.3 % — ABNORMAL LOW (ref 39.0–52.0)
Hemoglobin: 7.8 g/dL — ABNORMAL LOW (ref 13.0–17.0)
MCH: 22.6 pg — ABNORMAL LOW (ref 26.0–34.0)
MCHC: 28.6 g/dL — ABNORMAL LOW (ref 30.0–36.0)
MCV: 79.1 fL — ABNORMAL LOW (ref 80.0–100.0)
Platelets: 143 10*3/uL — ABNORMAL LOW (ref 150–400)
RBC: 3.45 MIL/uL — ABNORMAL LOW (ref 4.22–5.81)
RDW: 19.9 % — ABNORMAL HIGH (ref 11.5–15.5)
WBC: 4.9 10*3/uL (ref 4.0–10.5)
nRBC: 0 % (ref 0.0–0.2)

## 2020-09-12 LAB — BPAM RBC
Blood Product Expiration Date: 202202162359
Blood Product Expiration Date: 202202162359
ISSUE DATE / TIME: 202201132158
ISSUE DATE / TIME: 202201140409
Unit Type and Rh: 5100
Unit Type and Rh: 5100

## 2020-09-12 LAB — MAGNESIUM: Magnesium: 2.3 mg/dL (ref 1.7–2.4)

## 2020-09-12 LAB — GLUCOSE, CAPILLARY
Glucose-Capillary: 109 mg/dL — ABNORMAL HIGH (ref 70–99)
Glucose-Capillary: 114 mg/dL — ABNORMAL HIGH (ref 70–99)
Glucose-Capillary: 124 mg/dL — ABNORMAL HIGH (ref 70–99)
Glucose-Capillary: 83 mg/dL (ref 70–99)

## 2020-09-12 LAB — PHOSPHORUS: Phosphorus: 3.4 mg/dL (ref 2.5–4.6)

## 2020-09-12 MED ORDER — PANTOPRAZOLE SODIUM 40 MG PO TBEC: 40.0000 mg | DELAYED_RELEASE_TABLET | Freq: Two times a day (BID) | ORAL | 1 refills | Status: DC

## 2020-09-12 MED ORDER — PANTOPRAZOLE SODIUM 40 MG PO TBEC: 40.0000 mg | DELAYED_RELEASE_TABLET | Freq: Two times a day (BID) | ORAL | Status: DC

## 2020-09-12 NOTE — Care Management Obs Status (Signed)
[facility] NOTIFICATION   Patient Details  Name: [name] MRN: [id number] Date of Birth: [birth year]   Medicare Observation Status Notification Given:  Yes    Norina Buzzard, RN 09/12/2020, 9:02 AM

## 2020-09-12 NOTE — Progress Notes (Signed)

## 2020-09-12 NOTE — Discharge Summary (Signed)
Physician Discharge Summary  John BARBATO QZR:[id number] DOB: [id number] DOA: 09/10/2020  PCP: [name], MD  Admit date: 09/10/2020 Discharge date: 09/12/2020  Admitted From: Home  Discharge disposition: Home  Recommendations for Outpatient Follow-Up:    Follow up with your primary care provider in one week.   Check CBC, BMP, magnesium in the next visit  Follow-up with Dr. Therisa Doyne GI in 2 weeks.   Discharge Diagnosis:   Principal Problem:   GI bleeding Active Problems:   Type 2 diabetes with complication (HCC)   Coronary artery disease involving native coronary artery of native heart without angina pectoris   Hepatic cirrhosis (HCC)   CKD (chronic kidney disease), stage III (HCC)   (HFpEF) heart failure with preserved ejection fraction (HCC)   Asthma, mild intermittent   Alcoholic cirrhosis of liver with ascites (HCC)   Symptomatic anemia   Iron deficiency anemia   Discharge Condition: Improved.  Diet recommendation: Low sodium, heart healthy.  Carbohydrate-modified.  Wound care: None.  Code status: Full.   History of Present Illness:   [name] a 75 y.o.malewith medical history significant foralcoholic liver cirrhosis with ascites, chronic diastolic CHF, chronic kidney disease stage II, iron deficiency anemia, type 2 diabetes mellitus, hepatocellular carcinoma, coronary artery disease, and recent admission for upper GI bleed, presented to the ED to the emergency department at the direction of his PCP for evaluation of low hemoglobin. Patient was discharged from the hospital roughly 3 weeks ago after admission for upper GI bleed requiring blood transfusion and complicated by decompensated cirrhosis and heart failure. He had blood work performed for hospital follow-up visit, hemoglobin was 6.5, and he was directed to the ED.Upon arrival to the ED, patient was afebrile, saturating well on room air, and with stable blood pressure. Chemistry panel  was notable for BUN of 32 and creatinine 1.09. CBC featured a hemoglobin of 6.3, down from 7.4threeweeks ago. COVID-19 PCR was negative and fecal occult blood testing positive. Patient was given IV Protonix, PRBC and octreotide and was admitted to hospital.  GI was consulted.   Hospital Course:   Following conditions were addressed during hospitalization as listed below,  Iron deficiency anemia secondary to GI bleed. Patient presented with Hgb 6.3, down from 7.4 three weeks ago, FOBT was positive.  EGD from a month revealed grade 1 esophageal varices with portal hypertensive gastropathy and friable gastric mucosa and type I gastric varices.  Patient received IV Protonix, 1 unit of PRBC, octreotide.  He was also put on prophylactic Rocephin.  GI was consulted patient underwent endoscopic evaluation on 09/11/2020.  There was a tiny hiatal hernia,type I  gastric varices in the fundus without bleeding, portal hypertensive gastropathy, 2 small angiodysplastic lesions versus portal gastropathy but no bleeding.  Patient will be continued on Protonix twice daily on discharge.  Will need to follow-up with GI after discharge  Alcoholic cirrhosis with ascites; hepatocellular carcinoma  HCC s/p chemoembolization in November 21.  Patient continues to drink occasional beer.  Counseling was done.  Chronic diastolic CHF Patient appeared compensated.  Type II DM -Hemoglobin A1c was 5.3% a month ago.  Patient is on metformin and Amaryl as outpatient.  Will resume on discharge  CKD II -SCr is 1.09 admission, appears to be baseline.   Asthma Continue Anoro and prn albuterolinhaler  Compensated.  CAD No chest pain or anginal symptoms.  Continue statin on discharge..  Disposition.  At this time, patient is stable for disposition home with outpatient PCP  and GI follow-up.  Medical Consultants:    GI  Procedures:    EGD on 09/11/2020 Subjective:   Today, patient was seen and examined  at bedside.  Denies patient denies any nausea vomiting abdominal pain fever chills or rigors.  He wishes to go home  Discharge Exam:   Vitals:   09/12/20 0039 09/12/20 0433  BP: (!) 134/53 (!) 141/63  Pulse: 66 67  Resp: 16 16  Temp: 98.1 F (36.7 C) 97.8 F (36.6 C)  SpO2: 100% 97%   Vitals:   09/11/20 1813 09/11/20 2116 09/12/20 0039 09/12/20 0433  BP:  125/63 (!) 134/53 (!) 141/63  Pulse:  66 66 67  Resp:  '16 16 16  ' Temp:  97.8 F (36.6 C) 98.1 F (36.7 C) 97.8 F (36.6 C)  TempSrc:  Oral Oral Oral  SpO2:  100% 100% 97%  Weight: 105.7 kg     Height: '5\' 8"'  (1.727 m)       General: Alert awake, not in obvious distress HENT: pupils equally reacting to light,  No scleral pallor or icterus noted. Oral mucosa is moist.  Chest:  Clear breath sounds.  Diminished breath sounds bilaterally. No crackles or wheezes.  CVS: S1 &S2 heard. No murmur.  Regular rate and rhythm. Abdomen: Soft, nontender, mildly distended.  Bowel sounds are heard.   Extremities: No cyanosis, clubbing bilateral lower extremity edema noted, peripheral pulses are palpable. Psych: Alert, awake and oriented, normal mood CNS:  No cranial nerve deficits.  Power equal in all extremities.   Skin: Warm and dry.  No rashes noted.  The results of significant diagnostics from this hospitalization (including imaging, microbiology, ancillary and laboratory) are listed below for reference.     Diagnostic Studies:   No results found.   Labs:   Basic Metabolic Panel: Recent Labs  Lab 09/10/20 1442 09/10/20 1833 09/11/20 0322 09/12/20 0801  NA 137 139 138 136  K 3.9 4.1 3.8 4.4  CL 103 104 103 102  CO2 '27 25 24 25  ' GLUCOSE 151* 193* 126* 110*  BUN 31* 32* 26* 22  CREATININE 1.12 1.09 1.13 1.10  CALCIUM 8.9 8.6* 8.5* 8.8*  MG  --   --   --  2.3  PHOS  --   --   --  3.4   GFR Estimated Creatinine Clearance: 69.4 mL/min (by C-G formula based on SCr of 1.1 mg/dL). Liver Function Tests: Recent Labs  Lab  09/10/20 1442 09/10/20 1833 09/11/20 0322 09/12/20 0801  AST '21 25 24 21  ' ALT '18 21 19 18  ' ALKPHOS 170* 171* 151* 144*  BILITOT 0.5 0.5 0.8 1.1  PROT 7.0 7.3 6.9 7.2  ALBUMIN 4.0 3.7 3.6 3.8   No results for input(s): LIPASE, AMYLASE in the last 168 hours. No results for input(s): AMMONIA in the last 168 hours. Coagulation profile Recent Labs  Lab 09/10/20 1833  INR 1.1    CBC: Recent Labs  Lab 09/10/20 1442 09/10/20 1833 09/11/20 0220 09/11/20 0850 09/12/20 0801  WBC 5.0 4.9 4.5  --  4.9  NEUTROABS  --  3.9  --   --   --   HGB 6.5 Repeated and verified X2.* 6.3* 6.7* 7.5* 7.8*  HCT 21.3 Repeated and verified X2.* 23.2* 23.6* 25.9* 27.3*  MCV 70.6* 77.6* 78.1*  --  79.1*  PLT 150.0 157 144*  --  143*   Cardiac Enzymes: No results for input(s): CKTOTAL, CKMB, CKMBINDEX, TROPONINI in the last 168 hours. BNP: Invalid  input(s): POCBNP CBG: Recent Labs  Lab 09/11/20 2117 09/12/20 0036 09/12/20 0431 09/12/20 0738 09/12/20 1150  GLUCAP 112* 83 114* 109* 124*   D-Dimer No results for input(s): DDIMER in the last 72 hours. Hgb A1c No results for input(s): HGBA1C in the last 72 hours. Lipid Profile No results for input(s): CHOL, HDL, LDLCALC, TRIG, CHOLHDL, LDLDIRECT in the last 72 hours. Thyroid function studies No results for input(s): TSH, T4TOTAL, T3FREE, THYROIDAB in the last 72 hours.  Invalid input(s): FREET3 Anemia work up Recent Labs    09/10/20 1958  VITAMINB12 278  FOLATE 17.2  FERRITIN 6*  TIBC 583*  IRON 16*  RETICCTPCT 2.0   Microbiology Recent Results (from the past 240 hour(s))  Resp Panel by RT-PCR (Flu A&B, Covid) Nasopharyngeal Swab     Status: None   Collection Time: 09/10/20  8:36 PM   Specimen: Nasopharyngeal Swab; Nasopharyngeal(NP) swabs in vial transport medium  Result Value Ref Range Status   SARS Coronavirus 2 by RT PCR NEGATIVE NEGATIVE Final    Comment: (NOTE) SARS-CoV-2 target nucleic acids are NOT DETECTED.  The  SARS-CoV-2 RNA is generally detectable in upper respiratory specimens during the acute phase of infection. The lowest concentration of SARS-CoV-2 viral copies this assay can detect is 138 copies/mL. A negative result does not preclude SARS-Cov-2 infection and should not be used as the sole basis for treatment or other patient management decisions. A negative result may occur with  improper specimen collection/handling, submission of specimen other than nasopharyngeal swab, presence of viral mutation(s) within the areas targeted by this assay, and inadequate number of viral copies(<138 copies/mL). A negative result must be combined with clinical observations, patient history, and epidemiological information. The expected result is Negative.  Fact Sheet for Patients:  EntrepreneurPulse.com.au  Fact Sheet for Healthcare Providers:  IncredibleEmployment.be  This test is no t yet approved or cleared by the [place] FDA and  has been authorized for detection and/or diagnosis of SARS-CoV-2 by FDA under an Emergency Use Authorization (EUA). This EUA will remain  in effect (meaning this test can be used) for the duration of the COVID-19 declaration under Section 564(b)(1) of the Act, 21 U.S.C.section 360bbb-3(b)(1), unless the authorization is terminated  or revoked sooner.       Influenza A by PCR NEGATIVE NEGATIVE Final   Influenza B by PCR NEGATIVE NEGATIVE Final    Comment: (NOTE) The Xpert Xpress SARS-CoV-2/FLU/RSV plus assay is intended as an aid in the diagnosis of influenza from Nasopharyngeal swab specimens and should not be used as a sole basis for treatment. Nasal washings and aspirates are unacceptable for Xpert Xpress SARS-CoV-2/FLU/RSV testing.  Fact Sheet for Patients: EntrepreneurPulse.com.au  Fact Sheet for Healthcare Providers: IncredibleEmployment.be  This test is not yet approved or  cleared by the [place] FDA and has been authorized for detection and/or diagnosis of SARS-CoV-2 by FDA under an Emergency Use Authorization (EUA). This EUA will remain in effect (meaning this test can be used) for the duration of the COVID-19 declaration under Section 564(b)(1) of the Act, 21 U.S.C. section 360bbb-3(b)(1), unless the authorization is terminated or revoked.  Performed at [facility], [place] [address], [place], [place] [address]      Discharge Instructions:   Discharge Instructions    Diet general   Complete by: As directed    Discharge instructions   Complete by: As directed    Follow-up with  primary care physician in 1 week. Check  blood work at  that time.  Follow-up with GI Dr. Therisa Doyne in 2 weeks.   Increase activity slowly   Complete by: As directed      Allergies as of 09/12/2020   No Known Allergies     Medication List    TAKE these medications   Accu-Chek Softclix Lancets lancets USE AS DIRECTED TO TEST BLOOD SUGAR FOUR TIMES DAILY   albuterol 108 (90 Base) MCG/ACT inhaler Commonly known as: VENTOLIN HFA Inhale 2 puffs into the lungs every 6 (six) hours as needed for wheezing or shortness of breath.   Anoro Ellipta 62.5-25 MCG/INH Aepb Generic drug: umeclidinium-vilanterol Inhale 1 puff into the lungs daily.   atorvastatin 40 MG tablet Commonly known as: LIPITOR TAKE 1 TABLET BY MOUTH DAILY AT 6 PM What changed: See the new instructions.   blood glucose meter kit and supplies Dispense based on patient and insurance preference. Use up to four times daily as directed. (FOR ICD-10 E10.9, E11.9).   EX-LAX PO Take 1 tablet by mouth daily as needed (constipation).   ferrous sulfate 325 (65 FE) MG EC tablet Take 1 tablet (325 mg total) by mouth daily with breakfast.   gabapentin 100 MG capsule Commonly known as: NEURONTIN TAKE 1 CAPSULE(100 MG) BY MOUTH TWICE DAILY What changed: See the new instructions.   glimepiride  2 MG tablet Commonly known as: AMARYL Take 2 mg by mouth daily.   metFORMIN 850 MG tablet Commonly known as: GLUCOPHAGE TAKE 1 TABLET(850 MG) BY MOUTH TWICE DAILY WITH A MEAL What changed: See the new instructions.   nitroGLYCERIN 0.4 MG SL tablet Commonly known as: NITROSTAT Place 1 tablet (0.4 mg total) under the tongue every 5 (five) minutes as needed for chest pain.   pantoprazole 40 MG tablet Commonly known as: PROTONIX Take 1 tablet (40 mg total) by mouth 2 (two) times daily. What changed: when to take this   potassium chloride SA 20 MEQ tablet Commonly known as: KLOR-CON Take 1 tablet (20 mEq total) by mouth daily.   spironolactone 25 MG tablet Commonly known as: ALDACTONE Take 1 tablet (25 mg total) by mouth daily.   torsemide 20 MG tablet Commonly known as: DEMADEX Take 2 tablets (40 mg total) by mouth 2 (two) times daily.       Follow-up Information    Ronnette Juniper, MD. Schedule an appointment as soon as possible for a visit in 2 week(s).   Specialty: Gastroenterology Contact information: [place] [address] [phone]        [name], MD. Schedule an appointment as soon as possible for a visit in 1 week(s).   Specialty: Internal Medicine Why: check blood work Contact information: [place] [address] [phone]        Nahser, Wonda Cheng, MD .   Specialty: Cardiology Contact information: [place] [address] [phone]                Time coordinating discharge: 39 minutes  Signed:  Desmund Elman  [facility] 09/12/2020, 2:17 PM

## 2020-09-12 NOTE — Progress Notes (Signed)
Subjective: Patient states that he had a brown bowel movement today. He is requesting to go home.  Objective: Vital signs in last 24 hours: Temp:  [97.3 F (36.3 C)-98.1 F (36.7 C)] 97.8 F (36.6 C) (01/15 0433) Pulse Rate:  [58-70] 67 (01/15 0433) Resp:  [14-19] 16 (01/15 0433) BP: (99-150)/(44-87) 141/63 (01/15 0433) SpO2:  [92 %-100 %] 97 % (01/15 0433) Weight:  [105.7 kg] 105.7 kg (01/14 1813) Weight change:  Last BM Date: 09/10/20  PE: Obese, GENERAL: Mildly short of breath, able to speak in full sentences without use of accessory muscles of respiration ABDOMEN: Distended but nontender EXTREMITIES: Pedal edema  Lab Results: Results for orders placed or performed during the hospital encounter of 09/10/20 (from the past 48 hour(s))  Comprehensive metabolic panel     Status: Abnormal   Collection Time: 09/10/20  6:33 PM  Result Value Ref Range   Sodium 139 135 - 145 mmol/L   Potassium 4.1 3.5 - 5.1 mmol/L   Chloride 104 98 - 111 mmol/L   CO2 25 22 - 32 mmol/L   Glucose, Bld 193 (H) 70 - 99 mg/dL    Comment: Glucose reference range applies only to samples taken after fasting for at least 8 hours.   BUN 32 (H) 8 - 23 mg/dL   Creatinine, Ser 1.09 0.61 - 1.24 mg/dL   Calcium 8.6 (L) 8.9 - 10.3 mg/dL   Total Protein 7.3 6.5 - 8.1 g/dL   Albumin 3.7 3.5 - 5.0 g/dL   AST 25 15 - 41 U/L   ALT 21 0 - 44 U/L   Alkaline Phosphatase 171 (H) 38 - 126 U/L   Total Bilirubin 0.5 0.3 - 1.2 mg/dL   GFR, Estimated >60 >60 mL/min    Comment: (NOTE) Calculated using the CKD-EPI Creatinine Equation (2021)    Anion gap 10 5 - 15    Comment: Performed at [facility], [place] [address] [place], [place] [address]  Type and screen Foley     Status: None   Collection Time: 09/10/20  6:33 PM  Result Value Ref Range   ABO/RH(D) O POS    Antibody Screen NEG    Sample Expiration 09/13/2020,2359    Unit Number [id number]    Blood Component Type  RED CELLS,LR    Unit division 00    Status of Unit ISSUED,FINAL    Transfusion Status OK TO TRANSFUSE    Crossmatch Result Compatible    Unit Number [id number]    Blood Component Type RED CELLS,LR    Unit division 00    Status of Unit ISSUED,FINAL    Transfusion Status OK TO TRANSFUSE    Crossmatch Result      Compatible Performed at [facility], [place] [address], [place], [place] [address]   Protime-INR - (order if Patient is taking Coumadin / Warfarin)     Status: None   Collection Time: 09/10/20  6:33 PM  Result Value Ref Range   Prothrombin Time 13.5 11.4 - 15.2 seconds   INR 1.1 0.8 - 1.2    Comment: (NOTE) INR goal varies based on device and disease states. Performed at [facility], [place] [address], [place], [place] [address]   CBC with Differential     Status: Abnormal   Collection Time: 09/10/20  6:33 PM  Result Value Ref Range   WBC 4.9 4.0 - 10.5 K/uL   RBC 2.99 (L) 4.22 - 5.81 MIL/uL  Hemoglobin 6.3 (LL) 13.0 - 17.0 g/dL    Comment: Reticulocyte Hemoglobin testing may be clinically indicated, consider ordering this additional test [id number] THIS CRITICAL RESULT HAS VERIFIED AND BEEN CALLED TO RN J NASH BY ALEXIS CRUICKSHANK ON 01 13 2022 AT 1946, AND HAS BEEN READ BACK.     HCT 23.2 (L) 39.0 - 52.0 %   MCV 77.6 (L) 80.0 - 100.0 fL   MCH 21.1 (L) 26.0 - 34.0 pg   MCHC 27.2 (L) 30.0 - 36.0 g/dL   RDW 19.1 (H) 11.5 - 15.5 %   Platelets 157 150 - 400 K/uL   nRBC 0.0 0.0 - 0.2 %   Neutrophils Relative % 80 %   Neutro Abs 3.9 1.7 - 7.7 K/uL   Lymphocytes Relative 11 %   Lymphs Abs 0.5 (L) 0.7 - 4.0 K/uL   Monocytes Relative 7 %   Monocytes Absolute 0.4 0.1 - 1.0 K/uL   Eosinophils Relative 2 %   Eosinophils Absolute 0.1 0.0 - 0.5 K/uL   Basophils Relative 0 %   Basophils Absolute 0.0 0.0 - 0.1 K/uL   Immature Granulocytes 0 %   Abs Immature Granulocytes 0.02 0.00 - 0.07 K/uL    Comment: Performed at [facility], [place] [address], [place], [place] [address]  Vitamin B12     Status: None   Collection Time: 09/10/20  7:58 PM  Result Value Ref Range   Vitamin B-12 278 180 - 914 pg/mL    Comment: (NOTE) This assay is not validated for testing neonatal or myeloproliferative syndrome specimens for Vitamin B12 levels. Performed at [facility], [place] [address] [place], [place] [address]   Folate     Status: None   Collection Time: 09/10/20  7:58 PM  Result Value Ref Range   Folate 17.2 >5.9 ng/mL    Comment: Performed at [facility], [place] [address] [place], [place] [address]  Iron and TIBC     Status: Abnormal   Collection Time: 09/10/20  7:58 PM  Result Value Ref Range   Iron 16 (L) 45 - 182 ug/dL   TIBC 583 (H) 250 - 450 ug/dL   Saturation Ratios 3 (L) 17.9 - 39.5 %   UIBC 567 ug/dL    Comment: Performed at [facility], [place] [address], [place], [place] [address]  Ferritin     Status: Abnormal   Collection Time: 09/10/20  7:58 PM  Result Value Ref Range   Ferritin 6 (L) 24 - 336 ng/mL    Comment: Performed at [facility], [place] [address] [place], [place] [address]  Reticulocytes     Status: Abnormal   Collection Time: 09/10/20  7:58 PM  Result Value Ref Range   Retic Ct Pct 2.0 0.4 - 3.1 %   RBC. 3.12 (L) 4.22 - 5.81 MIL/uL   Retic Count, Absolute 61.2 19.0 - 186.0 K/uL   Immature Retic Fract 27.2 (H) 2.3 - 15.9 %    Comment: Performed at [facility], [place] [address], [place], [place] [address]  Prepare RBC (crossmatch)     Status: None   Collection Time: 09/10/20  8:33 PM  Result Value Ref Range   Order Confirmation      ORDER PROCESSED BY BLOOD BANK Performed at [facility], [place] [address], [place], [place] [address]   POC occult blood, ED     Status: Abnormal   Collection Time: 09/10/20  8:34 PM  Result Value Ref Range   Fecal Occult Bld POSITIVE  (A) NEGATIVE  Resp Panel by RT-PCR (Flu A&B, Covid) Nasopharyngeal Swab     Status: None   Collection Time: 09/10/20  8:36 PM   Specimen: Nasopharyngeal Swab; Nasopharyngeal(NP) swabs in vial transport medium  Result Value Ref Range   SARS Coronavirus 2 by RT PCR NEGATIVE NEGATIVE    Comment: (NOTE) SARS-CoV-2 target nucleic acids are NOT DETECTED.  The SARS-CoV-2 RNA is generally detectable in upper respiratory specimens during the acute phase of infection. The lowest concentration of SARS-CoV-2 viral copies this assay can detect is 138 copies/mL. A negative result does not preclude SARS-Cov-2 infection and should not be used as the sole basis for treatment or other patient management decisions. A negative result may occur with  improper specimen collection/handling, submission of specimen other than nasopharyngeal swab, presence of viral mutation(s) within the areas targeted by this assay, and inadequate number of viral copies(<138 copies/mL). A negative result must be combined with clinical observations, patient history, and epidemiological information. The expected result is Negative.  Fact Sheet for Patients:  EntrepreneurPulse.com.au  Fact Sheet for Healthcare Providers:  IncredibleEmployment.be  This test is no t yet approved or cleared by the [place] FDA and  has been authorized for detection and/or diagnosis of SARS-CoV-2 by FDA under an Emergency Use Authorization (EUA). This EUA will remain  in effect (meaning this test can be used) for the duration of the COVID-19 declaration under Section 564(b)(1) of the Act, 21 U.S.C.section 360bbb-3(b)(1), unless the authorization is terminated  or revoked sooner.       Influenza A by PCR NEGATIVE NEGATIVE   Influenza B by PCR NEGATIVE NEGATIVE    Comment: (NOTE) The Xpert Xpress SARS-CoV-2/FLU/RSV plus assay is intended as an aid in the diagnosis of influenza from Nasopharyngeal swab  specimens and should not be used as a sole basis for treatment. Nasal washings and aspirates are unacceptable for Xpert Xpress SARS-CoV-2/FLU/RSV testing.  Fact Sheet for Patients: EntrepreneurPulse.com.au  Fact Sheet for Healthcare Providers: IncredibleEmployment.be  This test is not yet approved or cleared by the [place] FDA and has been authorized for detection and/or diagnosis of SARS-CoV-2 by FDA under an Emergency Use Authorization (EUA). This EUA will remain in effect (meaning this test can be used) for the duration of the COVID-19 declaration under Section 564(b)(1) of the Act, 21 U.S.C. section 360bbb-3(b)(1), unless the authorization is terminated or revoked.  Performed at [facility], [place] [address], [place], [place] [address]   CBG monitoring, ED     Status: None   Collection Time: 09/10/20 11:52 PM  Result Value Ref Range   Glucose-Capillary 89 70 - 99 mg/dL    Comment: Glucose reference range applies only to samples taken after fasting for at least 8 hours.  CBC     Status: Abnormal   Collection Time: 09/11/20  2:20 AM  Result Value Ref Range   WBC 4.5 4.0 - 10.5 K/uL   RBC 3.02 (L) 4.22 - 5.81 MIL/uL   Hemoglobin 6.7 (LL) 13.0 - 17.0 g/dL    Comment: REPEATED TO VERIFY CRITICAL VALUE NOTED.  VALUE IS CONSISTENT WITH PREVIOUSLY REPORTED AND CALLED VALUE. Reticulocyte Hemoglobin testing may be clinically indicated, consider ordering this additional test [id number]    HCT 23.6 (L) 39.0 - 52.0 %   MCV 78.1 (L) 80.0 - 100.0 fL   MCH 22.2 (L) 26.0 - 34.0 pg   MCHC 28.4 (L)  30.0 - 36.0 g/dL   RDW 19.6 (H) 11.5 - 15.5 %   Platelets 144 (L) 150 - 400 K/uL   nRBC 0.0 0.0 - 0.2 %    Comment: Performed at [facility], [place] [address] [place], [place] [address]  Comprehensive metabolic panel     Status: Abnormal   Collection Time: 09/11/20  3:22 AM  Result Value Ref Range   Sodium 138  135 - 145 mmol/L   Potassium 3.8 3.5 - 5.1 mmol/L   Chloride 103 98 - 111 mmol/L   CO2 24 22 - 32 mmol/L   Glucose, Bld 126 (H) 70 - 99 mg/dL    Comment: Glucose reference range applies only to samples taken after fasting for at least 8 hours.   BUN 26 (H) 8 - 23 mg/dL   Creatinine, Ser 1.13 0.61 - 1.24 mg/dL   Calcium 8.5 (L) 8.9 - 10.3 mg/dL   Total Protein 6.9 6.5 - 8.1 g/dL   Albumin 3.6 3.5 - 5.0 g/dL   AST 24 15 - 41 U/L   ALT 19 0 - 44 U/L   Alkaline Phosphatase 151 (H) 38 - 126 U/L   Total Bilirubin 0.8 0.3 - 1.2 mg/dL   GFR, Estimated >60 >60 mL/min    Comment: (NOTE) Calculated using the CKD-EPI Creatinine Equation (2021)    Anion gap 11 5 - 15    Comment: Performed at [facility], [place] [address], [place], [place] [address]  Prepare RBC (crossmatch)     Status: None   Collection Time: 09/11/20  3:22 AM  Result Value Ref Range   Order Confirmation      ORDER PROCESSED BY BLOOD BANK Performed at [facility], [place] [address] [place], [place] [address]   CBG monitoring, ED     Status: Abnormal   Collection Time: 09/11/20  4:30 AM  Result Value Ref Range   Glucose-Capillary 140 (H) 70 - 99 mg/dL    Comment: Glucose reference range applies only to samples taken after fasting for at least 8 hours.  CBG monitoring, ED     Status: Abnormal   Collection Time: 09/11/20  7:36 AM  Result Value Ref Range   Glucose-Capillary 120 (H) 70 - 99 mg/dL    Comment: Glucose reference range applies only to samples taken after fasting for at least 8 hours.   Comment 1 Notify RN    Comment 2 Document in Chart   Hemoglobin and hematocrit, blood     Status: Abnormal   Collection Time: 09/11/20  8:50 AM  Result Value Ref Range   Hemoglobin 7.5 (L) 13.0 - 17.0 g/dL   HCT 25.9 (L) 39.0 - 52.0 %    Comment: Performed at [facility], [place] [address] [place], [place] [address]  CBG monitoring, ED     Status: Abnormal   Collection  Time: 09/11/20 11:36 AM  Result Value Ref Range   Glucose-Capillary 146 (H) 70 - 99 mg/dL    Comment: Glucose reference range applies only to samples taken after fasting for at least 8 hours.   Comment 1 Notify RN    Comment 2 Document in Chart   Glucose, capillary     Status: Abnormal   Collection Time: 09/11/20  4:44 PM  Result Value Ref Range   Glucose-Capillary 133 (H) 70 - 99 mg/dL    Comment: Glucose reference range applies only to samples taken after fasting for at least 8 hours.  Glucose, capillary     Status: Abnormal   Collection Time: 09/11/20  9:17 PM  Result Value Ref Range   Glucose-Capillary 112 (H) 70 - 99 mg/dL    Comment: Glucose reference range applies only to samples taken after fasting for at least 8 hours.  Glucose, capillary     Status: None   Collection Time: 09/12/20 12:36 AM  Result Value Ref Range   Glucose-Capillary 83 70 - 99 mg/dL    Comment: Glucose reference range applies only to samples taken after fasting for at least 8 hours.  Glucose, capillary     Status: Abnormal   Collection Time: 09/12/20  4:31 AM  Result Value Ref Range   Glucose-Capillary 114 (H) 70 - 99 mg/dL    Comment: Glucose reference range applies only to samples taken after fasting for at least 8 hours.  Glucose, capillary     Status: Abnormal   Collection Time: 09/12/20  7:38 AM  Result Value Ref Range   Glucose-Capillary 109 (H) 70 - 99 mg/dL    Comment: Glucose reference range applies only to samples taken after fasting for at least 8 hours.  Comprehensive metabolic panel     Status: Abnormal   Collection Time: 09/12/20  8:01 AM  Result Value Ref Range   Sodium 136 135 - 145 mmol/L   Potassium 4.4 3.5 - 5.1 mmol/L   Chloride 102 98 - 111 mmol/L   CO2 25 22 - 32 mmol/L   Glucose, Bld 110 (H) 70 - 99 mg/dL    Comment: Glucose reference range applies only to samples taken after fasting for at least 8 hours.   BUN 22 8 - 23 mg/dL   Creatinine, Ser 1.10 0.61 - 1.24 mg/dL    Calcium 8.8 (L) 8.9 - 10.3 mg/dL   Total Protein 7.2 6.5 - 8.1 g/dL   Albumin 3.8 3.5 - 5.0 g/dL   AST 21 15 - 41 U/L   ALT 18 0 - 44 U/L   Alkaline Phosphatase 144 (H) 38 - 126 U/L   Total Bilirubin 1.1 0.3 - 1.2 mg/dL   GFR, Estimated >60 >60 mL/min    Comment: (NOTE) Calculated using the CKD-EPI Creatinine Equation (2021)    Anion gap 9 5 - 15    Comment: Performed at [facility], [place] [address] [place], [place] [address]  CBC     Status: Abnormal   Collection Time: 09/12/20  8:01 AM  Result Value Ref Range   WBC 4.9 4.0 - 10.5 K/uL   RBC 3.45 (L) 4.22 - 5.81 MIL/uL   Hemoglobin 7.8 (L) 13.0 - 17.0 g/dL   HCT 27.3 (L) 39.0 - 52.0 %   MCV 79.1 (L) 80.0 - 100.0 fL   MCH 22.6 (L) 26.0 - 34.0 pg   MCHC 28.6 (L) 30.0 - 36.0 g/dL   RDW 19.9 (H) 11.5 - 15.5 %   Platelets 143 (L) 150 - 400 K/uL   nRBC 0.0 0.0 - 0.2 %    Comment: Performed at [facility], [place] [address], [place], [place] [address]  Magnesium     Status: None   Collection Time: 09/12/20  8:01 AM  Result Value Ref Range   Magnesium 2.3 1.7 - 2.4 mg/dL    Comment: Performed at [facility], [place] [address], [place], [place] [address]  Phosphorus     Status: None   Collection Time: 09/12/20  8:01 AM  Result Value Ref Range  Phosphorus 3.4 2.5 - 4.6 mg/dL    Comment: Performed at [facility], [place] [address] [place] [address], [place], [place] [address]  Glucose, capillary     Status: Abnormal   Collection Time: 09/12/20 11:50 AM  Result Value Ref Range   Glucose-Capillary 124 (H) 70 - 99 mg/dL    Comment: Glucose reference range applies only to samples taken after fasting for at least 8 hours.    Studies/Results: No results found.  Medications: I have reviewed the patient's current medications.  Assessment: Symptomatic anemia on presentation, resolved, hemoglobin stable at 7.8 Portal hypertension, longstanding history of iron deficiency  anemia  Cirrhosis without ascites Hepatocellular carcinoma-undergoing treatment with coil embolization(06/16/2020) radiation(06/30/2020)  Plan: Okay to discharge patient home today Resume regular diet DC IV octreotide Resume diuretics, same dosage as outpatient Advised patient to follow-up with me in the office in 2 weeks.  Ronnette Juniper, MD 09/12/2020, 1:53 PM

## 2020-09-15 ENCOUNTER — Telehealth: Payer: Self-pay

## 2020-09-15 ENCOUNTER — Encounter (HOSPITAL_COMMUNITY): Payer: Self-pay | Admitting: Gastroenterology

## 2020-09-15 ENCOUNTER — Other Ambulatory Visit: Payer: Self-pay | Admitting: Internal Medicine

## 2020-09-15 NOTE — Telephone Encounter (Signed)
Unable to get in contact with the patient. LVM letting him know that his form was ready for pick up. I also advised the patient that since he was older than 72 he could just send in a picture of his license to get out of jury duty. Office number was provided in case he has any questions or concerns.

## 2020-09-18 ENCOUNTER — Other Ambulatory Visit: Payer: Self-pay | Admitting: Interventional Radiology

## 2020-09-18 DIAGNOSIS — C220 Liver cell carcinoma: Secondary | ICD-10-CM

## 2020-09-22 ENCOUNTER — Telehealth: Payer: Self-pay | Admitting: Internal Medicine

## 2020-09-22 DIAGNOSIS — D649 Anemia, unspecified: Secondary | ICD-10-CM

## 2020-09-22 NOTE — Telephone Encounter (Signed)
[facility] called and stated they needed a new referral to be sent over that included PT, OT, and nursing orders.   If the notes from 09/10/20 can be sent over as well to be the face to face visit.   Fax: [phone]

## 2020-09-22 NOTE — Telephone Encounter (Signed)
See below

## 2020-09-24 NOTE — Addendum Note (Signed)
Addended by: Pricilla Holm A on: 09/24/2020 03:01 PM   Modules accepted: Orders

## 2020-09-24 NOTE — Telephone Encounter (Signed)
Office note from 09/10/2020 has been faxed. Confirmation fax has been received.

## 2020-09-24 NOTE — Telephone Encounter (Signed)
Orders placed.

## 2020-09-25 DIAGNOSIS — I130 Hypertensive heart and chronic kidney disease with heart failure and stage 1 through stage 4 chronic kidney disease, or unspecified chronic kidney disease: Secondary | ICD-10-CM | POA: Diagnosis not present

## 2020-09-25 NOTE — Telephone Encounter (Signed)
Office note has been faxed to the updated fax number provided below. Confirmation fax has been received.

## 2020-09-25 NOTE — Telephone Encounter (Signed)
FYI/ [facility] called and said from now on use fax number  [phone]

## 2020-09-28 ENCOUNTER — Other Ambulatory Visit: Payer: Self-pay | Admitting: Internal Medicine

## 2020-09-29 DIAGNOSIS — J4520 Mild intermittent asthma, uncomplicated: Secondary | ICD-10-CM

## 2020-09-29 DIAGNOSIS — R69 Illness, unspecified: Secondary | ICD-10-CM | POA: Diagnosis not present

## 2020-09-29 DIAGNOSIS — D62 Acute posthemorrhagic anemia: Secondary | ICD-10-CM

## 2020-09-29 DIAGNOSIS — G4733 Obstructive sleep apnea (adult) (pediatric): Secondary | ICD-10-CM

## 2020-09-29 DIAGNOSIS — D631 Anemia in chronic kidney disease: Secondary | ICD-10-CM | POA: Diagnosis not present

## 2020-09-29 DIAGNOSIS — E669 Obesity, unspecified: Secondary | ICD-10-CM

## 2020-09-29 DIAGNOSIS — M109 Gout, unspecified: Secondary | ICD-10-CM

## 2020-09-29 DIAGNOSIS — E1142 Type 2 diabetes mellitus with diabetic polyneuropathy: Secondary | ICD-10-CM | POA: Diagnosis not present

## 2020-09-29 DIAGNOSIS — Z7984 Long term (current) use of oral hypoglycemic drugs: Secondary | ICD-10-CM

## 2020-09-29 DIAGNOSIS — I5033 Acute on chronic diastolic (congestive) heart failure: Secondary | ICD-10-CM | POA: Diagnosis not present

## 2020-09-29 DIAGNOSIS — C229 Malignant neoplasm of liver, not specified as primary or secondary: Secondary | ICD-10-CM | POA: Diagnosis not present

## 2020-09-29 DIAGNOSIS — Z951 Presence of aortocoronary bypass graft: Secondary | ICD-10-CM

## 2020-09-29 DIAGNOSIS — N182 Chronic kidney disease, stage 2 (mild): Secondary | ICD-10-CM | POA: Diagnosis not present

## 2020-09-29 DIAGNOSIS — K219 Gastro-esophageal reflux disease without esophagitis: Secondary | ICD-10-CM

## 2020-09-29 DIAGNOSIS — D509 Iron deficiency anemia, unspecified: Secondary | ICD-10-CM

## 2020-09-29 DIAGNOSIS — K766 Portal hypertension: Secondary | ICD-10-CM | POA: Diagnosis not present

## 2020-09-29 DIAGNOSIS — I2510 Atherosclerotic heart disease of native coronary artery without angina pectoris: Secondary | ICD-10-CM

## 2020-09-29 DIAGNOSIS — I130 Hypertensive heart and chronic kidney disease with heart failure and stage 1 through stage 4 chronic kidney disease, or unspecified chronic kidney disease: Secondary | ICD-10-CM | POA: Diagnosis not present

## 2020-09-29 DIAGNOSIS — K3189 Other diseases of stomach and duodenum: Secondary | ICD-10-CM | POA: Diagnosis not present

## 2020-09-29 DIAGNOSIS — E1122 Type 2 diabetes mellitus with diabetic chronic kidney disease: Secondary | ICD-10-CM | POA: Diagnosis not present

## 2020-09-29 DIAGNOSIS — K7031 Alcoholic cirrhosis of liver with ascites: Secondary | ICD-10-CM

## 2020-09-29 DIAGNOSIS — Z6833 Body mass index (BMI) 33.0-33.9, adult: Secondary | ICD-10-CM

## 2020-10-08 ENCOUNTER — Inpatient Hospital Stay (HOSPITAL_COMMUNITY)
Admission: EM | Admit: 2020-10-08 | Discharge: 2020-10-10 | DRG: 432 | Disposition: A | Discharge status: Home / Self Care | Payer: Medicare HMO | Attending: Internal Medicine | Admitting: Internal Medicine

## 2020-10-08 ENCOUNTER — Emergency Department (HOSPITAL_COMMUNITY): Payer: Medicare HMO

## 2020-10-08 ENCOUNTER — Encounter (HOSPITAL_COMMUNITY): Payer: Self-pay | Admitting: Emergency Medicine

## 2020-10-08 ENCOUNTER — Other Ambulatory Visit: Payer: Self-pay

## 2020-10-08 DIAGNOSIS — I8500 Esophageal varices without bleeding: Secondary | ICD-10-CM | POA: Diagnosis not present

## 2020-10-08 DIAGNOSIS — K219 Gastro-esophageal reflux disease without esophagitis: Secondary | ICD-10-CM | POA: Diagnosis not present

## 2020-10-08 DIAGNOSIS — C220 Liver cell carcinoma: Secondary | ICD-10-CM | POA: Diagnosis present

## 2020-10-08 DIAGNOSIS — I864 Gastric varices: Secondary | ICD-10-CM | POA: Diagnosis not present

## 2020-10-08 DIAGNOSIS — K921 Melena: Secondary | ICD-10-CM | POA: Diagnosis not present

## 2020-10-08 DIAGNOSIS — K7031 Alcoholic cirrhosis of liver with ascites: Secondary | ICD-10-CM | POA: Diagnosis present

## 2020-10-08 DIAGNOSIS — I2510 Atherosclerotic heart disease of native coronary artery without angina pectoris: Secondary | ICD-10-CM | POA: Diagnosis present

## 2020-10-08 DIAGNOSIS — Z7984 Long term (current) use of oral hypoglycemic drugs: Secondary | ICD-10-CM | POA: Diagnosis not present

## 2020-10-08 DIAGNOSIS — Z79899 Other long term (current) drug therapy: Secondary | ICD-10-CM

## 2020-10-08 DIAGNOSIS — I5030 Unspecified diastolic (congestive) heart failure: Secondary | ICD-10-CM | POA: Diagnosis present

## 2020-10-08 DIAGNOSIS — D62 Acute posthemorrhagic anemia: Secondary | ICD-10-CM | POA: Diagnosis present

## 2020-10-08 DIAGNOSIS — R0602 Shortness of breath: Secondary | ICD-10-CM | POA: Diagnosis not present

## 2020-10-08 DIAGNOSIS — I5032 Chronic diastolic (congestive) heart failure: Secondary | ICD-10-CM | POA: Diagnosis present

## 2020-10-08 DIAGNOSIS — K7460 Unspecified cirrhosis of liver: Secondary | ICD-10-CM | POA: Diagnosis not present

## 2020-10-08 DIAGNOSIS — K922 Gastrointestinal hemorrhage, unspecified: Secondary | ICD-10-CM | POA: Diagnosis not present

## 2020-10-08 DIAGNOSIS — J811 Chronic pulmonary edema: Secondary | ICD-10-CM | POA: Diagnosis not present

## 2020-10-08 DIAGNOSIS — Z801 Family history of malignant neoplasm of trachea, bronchus and lung: Secondary | ICD-10-CM

## 2020-10-08 DIAGNOSIS — K3189 Other diseases of stomach and duodenum: Secondary | ICD-10-CM | POA: Diagnosis present

## 2020-10-08 DIAGNOSIS — E785 Hyperlipidemia, unspecified: Secondary | ICD-10-CM | POA: Diagnosis present

## 2020-10-08 DIAGNOSIS — G4733 Obstructive sleep apnea (adult) (pediatric): Secondary | ICD-10-CM | POA: Diagnosis present

## 2020-10-08 DIAGNOSIS — E669 Obesity, unspecified: Secondary | ICD-10-CM | POA: Diagnosis present

## 2020-10-08 DIAGNOSIS — Z800 Family history of malignant neoplasm of digestive organs: Secondary | ICD-10-CM

## 2020-10-08 DIAGNOSIS — Z8505 Personal history of malignant neoplasm of liver: Secondary | ICD-10-CM

## 2020-10-08 DIAGNOSIS — Z6837 Body mass index (BMI) 37.0-37.9, adult: Secondary | ICD-10-CM | POA: Diagnosis not present

## 2020-10-08 DIAGNOSIS — D509 Iron deficiency anemia, unspecified: Secondary | ICD-10-CM | POA: Diagnosis not present

## 2020-10-08 DIAGNOSIS — J90 Pleural effusion, not elsewhere classified: Secondary | ICD-10-CM | POA: Diagnosis not present

## 2020-10-08 DIAGNOSIS — E1122 Type 2 diabetes mellitus with diabetic chronic kidney disease: Secondary | ICD-10-CM | POA: Diagnosis present

## 2020-10-08 DIAGNOSIS — N182 Chronic kidney disease, stage 2 (mild): Secondary | ICD-10-CM | POA: Diagnosis not present

## 2020-10-08 DIAGNOSIS — E118 Type 2 diabetes mellitus with unspecified complications: Secondary | ICD-10-CM

## 2020-10-08 DIAGNOSIS — M479 Spondylosis, unspecified: Secondary | ICD-10-CM | POA: Diagnosis present

## 2020-10-08 DIAGNOSIS — Z9119 Patient's noncompliance with other medical treatment and regimen: Secondary | ICD-10-CM

## 2020-10-08 DIAGNOSIS — I517 Cardiomegaly: Secondary | ICD-10-CM | POA: Diagnosis not present

## 2020-10-08 DIAGNOSIS — D61818 Other pancytopenia: Secondary | ICD-10-CM | POA: Diagnosis not present

## 2020-10-08 DIAGNOSIS — I130 Hypertensive heart and chronic kidney disease with heart failure and stage 1 through stage 4 chronic kidney disease, or unspecified chronic kidney disease: Secondary | ICD-10-CM | POA: Diagnosis present

## 2020-10-08 DIAGNOSIS — I8511 Secondary esophageal varices with bleeding: Secondary | ICD-10-CM | POA: Diagnosis present

## 2020-10-08 DIAGNOSIS — J45909 Unspecified asthma, uncomplicated: Secondary | ICD-10-CM | POA: Diagnosis present

## 2020-10-08 DIAGNOSIS — Z923 Personal history of irradiation: Secondary | ICD-10-CM

## 2020-10-08 DIAGNOSIS — K31811 Angiodysplasia of stomach and duodenum with bleeding: Secondary | ICD-10-CM | POA: Diagnosis not present

## 2020-10-08 DIAGNOSIS — R69 Illness, unspecified: Secondary | ICD-10-CM | POA: Diagnosis not present

## 2020-10-08 DIAGNOSIS — Z833 Family history of diabetes mellitus: Secondary | ICD-10-CM

## 2020-10-08 DIAGNOSIS — K766 Portal hypertension: Secondary | ICD-10-CM | POA: Diagnosis not present

## 2020-10-08 DIAGNOSIS — D649 Anemia, unspecified: Secondary | ICD-10-CM | POA: Diagnosis not present

## 2020-10-08 DIAGNOSIS — Z20822 Contact with and (suspected) exposure to covid-19: Secondary | ICD-10-CM | POA: Diagnosis not present

## 2020-10-08 DIAGNOSIS — Z87891 Personal history of nicotine dependence: Secondary | ICD-10-CM

## 2020-10-08 DIAGNOSIS — E6609 Other obesity due to excess calories: Secondary | ICD-10-CM | POA: Diagnosis present

## 2020-10-08 DIAGNOSIS — Z8249 Family history of ischemic heart disease and other diseases of the circulatory system: Secondary | ICD-10-CM

## 2020-10-08 DIAGNOSIS — I8510 Secondary esophageal varices without bleeding: Secondary | ICD-10-CM | POA: Diagnosis not present

## 2020-10-08 LAB — BRAIN NATRIURETIC PEPTIDE: B Natriuretic Peptide: 311.1 pg/mL — ABNORMAL HIGH (ref 0.0–100.0)

## 2020-10-08 LAB — CBC
HCT: 18.7 % — ABNORMAL LOW (ref 39.0–52.0)
Hemoglobin: 4.9 g/dL — CL (ref 13.0–17.0)
MCH: 19.8 pg — ABNORMAL LOW (ref 26.0–34.0)
MCHC: 26.2 g/dL — ABNORMAL LOW (ref 30.0–36.0)
MCV: 75.4 fL — ABNORMAL LOW (ref 80.0–100.0)
Platelets: 157 10*3/uL (ref 150–400)
RBC: 2.48 MIL/uL — ABNORMAL LOW (ref 4.22–5.81)
RDW: 19.7 % — ABNORMAL HIGH (ref 11.5–15.5)
WBC: 4 10*3/uL (ref 4.0–10.5)
nRBC: 0 % (ref 0.0–0.2)

## 2020-10-08 LAB — COMPREHENSIVE METABOLIC PANEL
ALT: 18 U/L (ref 0–44)
AST: 27 U/L (ref 15–41)
Albumin: 3.7 g/dL (ref 3.5–5.0)
Alkaline Phosphatase: 142 U/L — ABNORMAL HIGH (ref 38–126)
Anion gap: 12 (ref 5–15)
BUN: 46 mg/dL — ABNORMAL HIGH (ref 8–23)
CO2: 21 mmol/L — ABNORMAL LOW (ref 22–32)
Calcium: 8.5 mg/dL — ABNORMAL LOW (ref 8.9–10.3)
Chloride: 103 mmol/L (ref 98–111)
Creatinine, Ser: 1.39 mg/dL — ABNORMAL HIGH (ref 0.61–1.24)
GFR, Estimated: 53 mL/min — ABNORMAL LOW (ref >60)
Glucose, Bld: 108 mg/dL — ABNORMAL HIGH (ref 70–99)
Potassium: 4.3 mmol/L (ref 3.5–5.1)
Sodium: 136 mmol/L (ref 135–145)
Total Bilirubin: 1.1 mg/dL (ref 0.3–1.2)
Total Protein: 7.2 g/dL (ref 6.5–8.1)

## 2020-10-08 LAB — PREPARE RBC (CROSSMATCH)

## 2020-10-08 LAB — TROPONIN I (HIGH SENSITIVITY)
Troponin I (High Sensitivity): 12 ng/L (ref <18)
Troponin I (High Sensitivity): 11 ng/L (ref <18)

## 2020-10-08 LAB — POC OCCULT BLOOD, ED: Fecal Occult Bld: POSITIVE — AB

## 2020-10-08 MED ORDER — SODIUM CHLORIDE 0.9 % IV SOLN
50.0000 ug/h | INTRAVENOUS | Status: DC
  Administered 2020-10-08 – 2020-10-10 (×3): 50 ug/h via INTRAVENOUS
  Filled 2020-10-08 (×5): qty 1

## 2020-10-08 MED ORDER — SODIUM CHLORIDE 0.9 % IV SOLN
10.0000 mL/h | Freq: Once | INTRAVENOUS | Status: AC
  Administered 2020-10-08: 10 mL/h via INTRAVENOUS

## 2020-10-08 MED ORDER — PANTOPRAZOLE SODIUM 40 MG IV SOLR: 40.0000 mg | Freq: Two times a day (BID) | INTRAVENOUS | Status: DC

## 2020-10-08 MED ORDER — PANTOPRAZOLE SODIUM 40 MG IV SOLR
40.0000 mg | Freq: Once | INTRAVENOUS | Status: AC
  Administered 2020-10-08: 40 mg via INTRAVENOUS
  Filled 2020-10-08: qty 40

## 2020-10-08 MED ORDER — INSULIN ASPART 100 UNIT/ML ~~LOC~~ SOLN: 0.0000 [IU] | Freq: Every day | SUBCUTANEOUS | Status: DC

## 2020-10-08 MED ORDER — INSULIN ASPART 100 UNIT/ML ~~LOC~~ SOLN
0.0000 [IU] | Freq: Three times a day (TID) | SUBCUTANEOUS | Status: DC
  Administered 2020-10-10: 3 [IU] via SUBCUTANEOUS

## 2020-10-08 MED ORDER — SODIUM CHLORIDE 0.9 % IV SOLN
80.0000 mg | Freq: Once | INTRAVENOUS | Status: AC
  Administered 2020-10-09: 80 mg via INTRAVENOUS
  Filled 2020-10-08: qty 80

## 2020-10-08 MED ORDER — LACTATED RINGERS IV SOLN: INTRAVENOUS | Status: DC

## 2020-10-08 MED ORDER — SODIUM CHLORIDE 0.9 % IV SOLN
8.0000 mg/h | INTRAVENOUS | Status: DC
  Administered 2020-10-09 – 2020-10-10 (×3): 8 mg/h via INTRAVENOUS
  Filled 2020-10-08 (×4): qty 80

## 2020-10-08 NOTE — H&P (Signed)
History and Physical   [name] [id number]:[id number] DOB: Jan 21, 1946 DOA: 10/08/2020  Referring MD/NP/PA: Dr. Vallery Ridge  PCP: [name], MD   Patient coming from: Home  Chief Complaint: Shortness of breath and weakness  HPI: [name] is a 75 y.o. male with medical history significant of liver cirrhosis with ascites, suspected hepatocellular carcinoma previously, diastolic dysfunction CHF, coronary artery disease with preserved ejection fraction, GERD, morbid obesity, history of GI bleed in the past who presents to the ER with progressive weakness lower extremity edema and exertional dyspnea.  Symptoms have been going off for a little while.  But got worse in the last week.  Patient is feeling heavy breathing.  He has also significant bilateral edema.  He recently noted melena on and off.  He has history of alcoholic cirrhosis but quit alcohol long time ago.  He has had chronic anemia.  He has had some mild cough but mainly the shortness of breath and the leg swelling.  He apparently had an EGD about 2 months ago that showed grade 1 esophageal varices with portal hypertensive gastropathy.  A month ago he was admitted with upper GI bleed and now back again with what appears to be upper GI bleed.  His hemoglobin was 5 to be 4.9.  He is being admitted with symptomatic anemia secondary to upper GI bleed..  ED Course: Temperature is 99 blood pressure 180/54 pulse 70 respirate 26 oxygen sat 93% on room air.  White count of 4.0 hemoglobin 4.9 and platelets 157.  Sodium 136 potassium 4.2 chloride 103 CO2 21 BUN 46 creatinine 1.39 calcium 8.5.  Fecal occult blood testing is positive x2.  COVID-19 testing is pending.  Patient initiated on octreotide and Protonix drip.  Also ordered 3 units of packed red blood cells and is being admitted to the hospital.  [facility] has been consulted  Review of Systems: As per HPI otherwise 10 point review of systems negative.    Past Medical History:   Diagnosis Date  . Arthritis    "joints tighten up sometimes" (03/27/2104)  . Carcinoma of liver, hepatocellular ([place]) 06/30/2020  . CHF (congestive heart failure) ([place])   . Chronic lower back pain   . Coronary artery disease    a. 05/2012 Cath/PCI: LM 40ost, 50-60d, LAD 99p ruptured plaque (3.0x28 DES), LCX 50p/m, RCA 30-40p, 60m EF 65-70%.  . Coronary artery disease involving native coronary artery of native heart without angina pectoris    Severe left main disease at catheterization July 2015  CABG x3 with a LIMA to the LAD, SVG to the OM, SVG to the PDA on 03/31/14. EF 60% by cath.   . Diastolic heart failure ([place]   . GERD without esophagitis 08/04/2010  . Hepatic cirrhosis ([place]    a. Dx 01/2014 - CT a/p   . History of blood transfusion    "related to bleeding ulcers"  . History of concussion    1976--  NO RESIDUAL  . History of GI bleed    a. UGIB 07/2012;  b. 01/2014 admission with GIB/FOB stool req 1U prbc's->EGD showed portal gastropathy, barrett's esoph, and chronic active h. pylori gastritis.  .Marland KitchenHistory of gout    2007 &  2008  LEFT LEG-- NO ISSUE SINCE  . Hyperlipidemia   . Iron deficiency anemia   . Kidney stones   . OA (osteoarthritis of spine)    LOWER BACK--  INTERMITTANT LEFT LEG NUMBNESS  . OSA (obstructive sleep apnea)  PULMOLOGIST-  DR CLANCE--  MODERATE OSA  STARTED CPAP 2012--  BUT CURRENTLY HAS NOT USED PAST 6 MONTHS  . Phimosis    a. s/p circumcision 2015.  . Type 2 diabetes mellitus ([place])   . Unspecified essential hypertension     Past Surgical History:  Procedure Laterality Date  . ANKLE FRACTURE SURGERY Right 1989   "plate put in"  . APPENDECTOMY  05-16-2004   open  . CIRCUMCISION N/A 09/09/2013   Procedure: CIRCUMCISION ADULT;  Surgeon: Bernestine Amass, MD;  Location: [facility];  Service: Urology;  Laterality: N/A;  . COLECTOMY  05-16-2004  . CORONARY ANGIOPLASTY WITH STENT PLACEMENT  06/28/2012  DR COOPER   PCI W/  X1 DES to  [place]. LAD/  LM  40% OSTIAL & 50-60% DISTAL /  50% PROX LCX/  30-40% PROX RCA & 50% MID RCA/   LVEF 65-70%  . CORONARY ARTERY BYPASS GRAFT N/A 03/31/2014   Procedure: CORONARY ARTERY BYPASS GRAFTING (CABG) times 3 using left internal mammary artery and right saphenous vein.;  Surgeon: Melrose Nakayama, MD;  Location: [name];  Service: Open Heart Surgery;  Laterality: N/A;  . DOBUTAMINE STRESS ECHO  06-08-2012   MODERATE HYPOKINESIS/ ISCHEMIA MID INFERIOR WALL  . ESOPHAGOGASTRODUODENOSCOPY  08/15/2012   Procedure: ESOPHAGOGASTRODUODENOSCOPY (EGD);  Surgeon: Wonda Horner, MD;  Location[facility] ENDOSCOPY;  Service: Endoscopy;  Laterality: N/A;  . ESOPHAGOGASTRODUODENOSCOPY N/A 02/17/2014   Procedure: ESOPHAGOGASTRODUODENOSCOPY (EGD);  Surgeon: Jeryl Columbia, MD;  Location: [facility] ENDOSCOPY;  Service: Endoscopy;  Laterality: N/A;  . ESOPHAGOGASTRODUODENOSCOPY N/A 04/17/2020   Procedure: ESOPHAGOGASTRODUODENOSCOPY (EGD);  Surgeon: Ronnette Juniper, MD;  Location: [place];  Service: Gastroenterology;  Laterality: N/A;  . ESOPHAGOGASTRODUODENOSCOPY N/A 09/11/2020   Procedure: ESOPHAGOGASTRODUODENOSCOPY (EGD);  Surgeon: Clarene Essex, MD;  Location: [facility];  Service: Endoscopy;  Laterality: N/A;  . ESOPHAGOGASTRODUODENOSCOPY (EGD) WITH PROPOFOL N/A 09/30/2017   Procedure: ESOPHAGOGASTRODUODENOSCOPY (EGD) WITH PROPOFOL;  Surgeon: Wonda Horner, MD;  Location: [facility] ENDOSCOPY;  Service: Endoscopy;  Laterality: N/A;  . ESOPHAGOGASTRODUODENOSCOPY (EGD) WITH PROPOFOL N/A 12/20/2017   Procedure: ESOPHAGOGASTRODUODENOSCOPY (EGD) WITH PROPOFOL;  Surgeon: Clarene Essex, MD;  Location: [place];  Service: Endoscopy;  Laterality: N/A;  . ESOPHAGOGASTRODUODENOSCOPY (EGD) WITH PROPOFOL N/A 08/10/2020   Procedure: ESOPHAGOGASTRODUODENOSCOPY (EGD) WITH PROPOFOL;  Surgeon: Wilford Corner, MD;  Location: [place];  Service: Endoscopy;  Laterality: N/A;  . HERNIA REPAIR    . INTRAOPERATIVE TRANSESOPHAGEAL ECHOCARDIOGRAM N/A  03/31/2014   Procedure: INTRAOPERATIVE TRANSESOPHAGEAL ECHOCARDIOGRAM;  Surgeon: Melrose Nakayama, MD;  Location: [place];  Service: Open Heart Surgery;  Laterality: N/A;  . IR ANGIOGRAM SELECTIVE EACH ADDITIONAL VESSEL  06/16/2020  . IR ANGIOGRAM SELECTIVE EACH ADDITIONAL VESSEL  06/16/2020  . IR ANGIOGRAM SELECTIVE EACH ADDITIONAL VESSEL  06/16/2020  . IR ANGIOGRAM SELECTIVE EACH ADDITIONAL VESSEL  06/16/2020  . IR ANGIOGRAM SELECTIVE EACH ADDITIONAL VESSEL  06/16/2020  . IR ANGIOGRAM SELECTIVE EACH ADDITIONAL VESSEL  06/16/2020  . IR ANGIOGRAM SELECTIVE EACH ADDITIONAL VESSEL  06/16/2020  . IR ANGIOGRAM SELECTIVE EACH ADDITIONAL VESSEL  06/16/2020  . IR ANGIOGRAM SELECTIVE EACH ADDITIONAL VESSEL  06/16/2020  . IR ANGIOGRAM SELECTIVE EACH ADDITIONAL VESSEL  06/30/2020  . IR ANGIOGRAM SELECTIVE EACH ADDITIONAL VESSEL  06/30/2020  . IR ANGIOGRAM VISCERAL SELECTIVE  06/16/2020  . IR ANGIOGRAM VISCERAL SELECTIVE  06/30/2020  . IR EMBO ARTERIAL NOT HEMORR HEMANG INC GUIDE ROADMAPPING  06/16/2020  . IR EMBO TUMOR ORGAN ISCHEMIA INFARCT INC GUIDE ROADMAPPING  06/30/2020  .  IR RADIOLOGIST EVAL & MGMT  05/06/2020  . IR RADIOLOGIST EVAL & MGMT  05/20/2020  . IR US GUIDE VASC ACCESS RIGHT  06/16/2020  . IR US GUIDE VASC ACCESS RIGHT  06/30/2020  . LAPAROSCOPIC UMBILICAL HERNIA REPAIR W/ MESH  06-06-2011  . LEFT HEART CATHETERIZATION WITH CORONARY ANGIOGRAM N/A 03/28/2014   Procedure: LEFT HEART CATHETERIZATION WITH CORONARY ANGIOGRAM;  Surgeon: Sinclair Grooms, MD;  Location: [facility] CATH LAB;  Service: Cardiovascular;  Laterality: N/A;  . LIPOMA EXCISION Left 08/25/2017   Procedure: EXCISION LIPOMA LEFT POSTERIOR THIGH;  Surgeon: Clovis Riley, MD;  Location: [place];  Service: General;  Laterality: Left;  . NEPHROLITHOTOMY  1990'S  . OPEN APPENDECTOMY W/ PARTIAL CECECTOMY  05-16-2004  . PERCUTANEOUS CORONARY STENT INTERVENTION (PCI-S) N/A 06/28/2012   Procedure: PERCUTANEOUS CORONARY STENT INTERVENTION  (PCI-S);  Surgeon: Sherren Mocha, MD;  Location: [facility];  Service: Cardiovascular;  Laterality: N/A;     reports that he quit smoking about 24 years ago. His smoking use included cigarettes. He has a 80.00 pack-year smoking history. He has never used smokeless tobacco. He reports current alcohol use of about 8.0 standard drinks of alcohol per week. He reports that he does not use drugs.  No Known Allergies  Family History  Problem Relation Age of Onset  . Lung cancer Sister   . Cancer Sister        lung  . Cancer Mother   . Cancer Father        died in his 68s.  . Cancer Brother        lung  . Coronary artery disease Other   . Diabetes Other   . Colon cancer Other   . Cancer Sister        lung     Prior to Admission medications   Medication Sig Start Date End Date Taking? Authorizing Provider  Accu-Chek Softclix Lancets lancets USE AS DIRECTED TO TEST BLOOD SUGAR FOUR TIMES DAILY 03/13/20   [name], MD  albuterol (VENTOLIN HFA) 108 (90 Base) MCG/ACT inhaler Inhale 2 puffs into the lungs every 6 (six) hours as needed for wheezing or shortness of breath. 03/12/20   [name], MD  atorvastatin (LIPITOR) 40 MG tablet TAKE 1 TABLET BY MOUTH DAILY AT 6 PM 09/30/20   [name], MD  blood glucose meter kit and supplies Dispense based on patient and insurance preference. Use up to four times daily as directed. (FOR ICD-10 E10.9, E11.9). 03/12/20   [name], MD  ferrous sulfate 325 (65 FE) MG EC tablet Take 1 tablet (325 mg total) by mouth daily with breakfast. 01/16/20   [name], MD  gabapentin (NEURONTIN) 100 MG capsule TAKE 1 CAPSULE(100 MG) BY MOUTH TWICE DAILY Patient taking differently: Take 100 mg by mouth 2 (two) times daily. 08/03/20   [name], MD  glimepiride (AMARYL) 2 MG tablet Take 2 mg by mouth daily. 07/13/20   [provider]  metFORMIN (GLUCOPHAGE) 850 MG tablet TAKE 1 TABLET(850 MG) BY  MOUTH TWICE DAILY WITH A MEAL Patient taking differently: Take 850 mg by mouth 2 (two) times daily with a meal. 06/16/20   [name], MD  nitroGLYCERIN (NITROSTAT) 0.4 MG SL tablet Place 1 tablet (0.4 mg total) under the tongue every 5 (five) minutes as needed for chest pain. 06/08/16   Nahser, Wonda Cheng, MD  pantoprazole (PROTONIX) 40 MG tablet Take 1 tablet (40 mg total) by  mouth 2 (two) times daily. 09/12/20   Pokhrel, Corrie Mckusick, MD  potassium chloride (KLOR-CON) 20 MEQ tablet Take 1 tablet (20 mEq total) by mouth daily. 08/18/20   Danford, Suann Larry, MD  Sennosides (EX-LAX PO) Take 1 tablet by mouth daily as needed (constipation).    [provider]  spironolactone (ALDACTONE) 25 MG tablet Take 1 tablet (25 mg total) by mouth daily. 08/18/20   Danford, Suann Larry, MD  torsemide (DEMADEX) 20 MG tablet Take 2 tablets (40 mg total) by mouth 2 (two) times daily. 08/18/20   Danford, Suann Larry, MD  umeclidinium-vilanterol (ANORO ELLIPTA) 62.5-25 MCG/INH AEPB Inhale 1 puff into the lungs daily. 03/12/20   [name], MD    Physical Exam: Vitals:   10/08/20 2023 10/08/20 2045 10/08/20 2100 10/08/20 2115  BP: 140/63 126/71    Pulse:  78 75 68  Resp:  (!) '23 15 17  ' Temp: 97.8 F (36.6 C)     TempSrc: Oral     SpO2:  100% 100% 93%      Constitutional: Morbidly obese, in mild distress Vitals:   10/08/20 2023 10/08/20 2045 10/08/20 2100 10/08/20 2115  BP: 140/63 126/71    Pulse:  78 75 68  Resp:  (!) '23 15 17  ' Temp: 97.8 F (36.6 C)     TempSrc: Oral     SpO2:  100% 100% 93%   Eyes: PERRL, lids and conjunctivae pale ENMT: Mucous membranes are moist. Posterior pharynx clear of any exudate or lesions.Normal dentition.  Neck: normal, supple, no masses, no thyromegaly Respiratory: Good air entry bilaterally with diffuse Rales and crackles no wheezing, normal respiratory effort. No accessory muscle use.  Cardiovascular: Regular rate and rhythm, no  murmurs / rubs / gallops.  2+ extremity edema. 2+ pedal pulses. No carotid bruits.  Abdomen: no tenderness, no masses palpated.  Palpable hepatosplenomegaly. Bowel sounds positive.  Musculoskeletal: no clubbing / cyanosis. No joint deformity upper and lower extremities. Good ROM, no contractures. Normal muscle tone.  Skin: no rashes, lesions, ulcers. No induration Neurologic: CN 2-12 grossly intact. Sensation intact, DTR normal. Strength 5/5 in all 4.  Psychiatric: Normal judgment and insight. Alert and oriented x 3. Normal mood.     Labs on Admission: I have personally reviewed following labs and imaging studies  CBC: Recent Labs  Lab 10/08/20 1626  WBC 4.0  HGB 4.9*  HCT 18.7*  MCV 75.4*  PLT 829   Basic Metabolic Panel: Recent Labs  Lab 10/08/20 1626  NA 136  K 4.3  CL 103  CO2 21*  GLUCOSE 108*  BUN 46*  CREATININE 1.39*  CALCIUM 8.5*   GFR: CrCl cannot be calculated (Unknown ideal weight.). Liver Function Tests: Recent Labs  Lab 10/08/20 1626  AST 27  ALT 18  ALKPHOS 142*  BILITOT 1.1  PROT 7.2  ALBUMIN 3.7   No results for input(s): LIPASE, AMYLASE in the last 168 hours. No results for input(s): AMMONIA in the last 168 hours. Coagulation Profile: No results for input(s): INR, PROTIME in the last 168 hours. Cardiac Enzymes: No results for input(s): CKTOTAL, CKMB, CKMBINDEX, TROPONINI in the last 168 hours. BNP (last 3 results) No results for input(s): PROBNP in the last 8760 hours. HbA1C: No results for input(s): HGBA1C in the last 72 hours. CBG: No results for input(s): GLUCAP in the last 168 hours. Lipid Profile: No results for input(s): CHOL, HDL, LDLCALC, TRIG, CHOLHDL, LDLDIRECT in the last 72 hours. Thyroid Function Tests: No results for  input(s): TSH, T4TOTAL, FREET4, T3FREE, THYROIDAB in the last 72 hours. Anemia Panel: No results for input(s): VITAMINB12, FOLATE, FERRITIN, TIBC, IRON, RETICCTPCT in the last 72 hours. Urine analysis:     Component Value Date/Time   COLORURINE YELLOW 05/06/2020 [place] 05/06/2020 0954   LABSPEC 1.009 05/06/2020 0954   PHURINE 7.0 05/06/2020 0954   GLUCOSEU NEGATIVE 05/06/2020 0954   HGBUR NEGATIVE 05/06/2020 0954   BILIRUBINUR NEGATIVE 07/29/2019 1849   KETONESUR NEGATIVE 05/06/2020 0954   PROTEINUR NEGATIVE 05/06/2020 0954   UROBILINOGEN 0.2 03/29/2014 1546   NITRITE NEGATIVE 05/06/2020 0954   LEUKOCYTESUR NEGATIVE 05/06/2020 0954   Sepsis Labs: '@LABRCNTIP' (procalcitonin:4,lacticidven:4) )No results found for this or any previous visit (from the past 240 hour(s)).   Radiological Exams on Admission: DG Chest 2 View  Result Date: 10/08/2020 CLINICAL DATA:  Shortness of breath. EXAM: CHEST - 2 VIEW COMPARISON:  August 16, 2020. FINDINGS: Stable cardiomegaly. No pneumothorax or pleural effusion is noted. Status post coronary bypass graft. Mild bilateral perihilar and basilar interstitial densities are noted which may represent scarring, but acute superimposed edema cannot be excluded. Bony thorax is unremarkable. IMPRESSION: Mild bilateral perihilar and basilar interstitial densities are noted which may represent scarring, but acute superimposed edema cannot be excluded. Electronically Signed   By: Marijo Conception M.D.   On: 10/08/2020 16:39      Assessment/Plan Principal Problem:   Symptomatic anemia Active Problems:   GERD without esophagitis   Melena   Morbid obesity (HCC)   (HFpEF) heart failure with preserved ejection fraction (HCC)   Alcoholic cirrhosis of liver with ascites (HCC)   CKD (chronic kidney disease), stage II     #1 symptomatic anemia: Secondary to upper GI bleed.  Patient will be admitted.  Serial H&H.  IV Protonix and octreotide.  Initiate mild IV fluids.  Monitor patient closely.  Diuresis when fully stable.  GI consulted and patient will be n.p.o. for possible EGD.  #2 melena: Most likely as a result of portal gastropathy in this patient  with cirrhosis.  Continue monitoring H&H and treatment as above.  #3 chronic kidney disease stage II: Continue to monitor creatinine.  #4 morbid obesity: Continue close monitoring.  #5 diastolic CHF: Again continue to monitor.  Patient to be monitored for possible fluid overload.    DVT prophylaxis: SCD Code Status: Full code Family Communication: Wife Disposition Plan: Home Consults called: [facility] Admission status: Inpatient  Severity of Illness: The appropriate patient status for this patient is INPATIENT. Inpatient status is judged to be reasonable and necessary in order to provide the required intensity of service to ensure the patient's safety. The patient's presenting symptoms, physical exam findings, and initial radiographic and laboratory data in the context of their chronic comorbidities is felt to place them at high risk for further clinical deterioration. Furthermore, it is not anticipated that the patient will be medically stable for discharge from the hospital within 2 midnights of admission. The following factors support the patient status of inpatient.   " The patient's presenting symptoms include shortness of breath and edema. " The worrisome physical exam findings include pale conjunctiva with bilateral edema. " The initial radiographic and laboratory data are worrisome because of hemoglobin 4.9. " The chronic co-morbidities include alcoholic cirrhosis.   * I certify that at the point of admission it is my clinical judgment that the patient will require inpatient hospital care spanning beyond 2 midnights from the point of admission due to high  intensity of service, high risk for further deterioration and high frequency of surveillance required.Barbette Merino MD [facility] Pager [phone]  If 7PM-7AM, please contact night-coverage [email or web address] Password [facility]  10/08/2020, 10:39 PM

## 2020-10-08 NOTE — ED Provider Notes (Signed)
[facility] EMERGENCY DEPARTMENT Provider Note   CSN: [id number] Arrival date & time: 10/08/20  1534     History Chief Complaint  Patient presents with  . Shortness of Breath    [name] is a 75 y.o. male.  The history is provided by the patient and medical records. No language interpreter was used.  Shortness of Breath    75 year old male significant history of alcoholic liver cirrhosis with ascites, CHF, CKD, iron deficiency anemia, diabetes, hepatocellular carcinoma, CAD, recurrent hospitalization for GI bleed presenting with complaints of shortness of breath.  Patient report for the past 2 weeks he endorsed progressive worsening generalized fatigue as well as having shortness of breath.  The symptoms felt similar to prior anemia requiring blood transfusion.  Endorse occasional cough but this is chronic for him.  Endorses mild epigastric discomfort that is waxing waning.  Several days ago he did notice some dark stool but that has since improved.  He denies nausea vomiting denies gum bleeding or any other abnormal bleeding.  He has not been vaccinated for COVID-19.  He report last alcohol use was more than a month ago.  No report of any active chest pain.  Patient had an EGD from 2 months ago the revealed a grade 1 esophageal varices with portal hypertensive gastropathy and friable gastric mucosa and type I gastric varices.  Patient was last admitted in the hospital a month ago for upper GI bleed along with iron deficiency anemia.  Past Medical History:  Diagnosis Date  . Arthritis    "joints tighten up sometimes" (03/27/2104)  . Carcinoma of liver, hepatocellular ([place]) 06/30/2020  . CHF (congestive heart failure) ([place])   . Chronic lower back pain   . Coronary artery disease    a. 05/2012 Cath/PCI: LM 40ost, 50-60d, LAD 99p ruptured plaque (3.0x28 DES), LCX 50p/m, RCA 30-40p, 67m EF 65-70%.  . Coronary artery disease involving native coronary artery of  native heart without angina pectoris    Severe left main disease at catheterization July 2015  CABG x3 with a LIMA to the LAD, SVG to the OM, SVG to the PDA on 03/31/14. EF 60% by cath.   . Diastolic heart failure ([place]   . GERD without esophagitis 08/04/2010  . Hepatic cirrhosis ([place]    a. Dx 01/2014 - CT a/p   . History of blood transfusion    "related to bleeding ulcers"  . History of concussion    1976--  NO RESIDUAL  . History of GI bleed    a. UGIB 07/2012;  b. 01/2014 admission with GIB/FOB stool req 1U prbc's->EGD showed portal gastropathy, barrett's esoph, and chronic active h. pylori gastritis.  .Marland KitchenHistory of gout    2007 &  2008  LEFT LEG-- NO ISSUE SINCE  . Hyperlipidemia   . Iron deficiency anemia   . Kidney stones   . OA (osteoarthritis of spine)    LOWER BACK--  INTERMITTANT LEFT LEG NUMBNESS  . OSA (obstructive sleep apnea)    PULMOLOGIST-  DR CLANCE--  MODERATE OSA  STARTED CPAP 2012--  BUT CURRENTLY HAS NOT USED PAST 6 MONTHS  . Phimosis    a. s/p circumcision 2015.  . Type 2 diabetes mellitus ([place]   . Unspecified essential hypertension     Patient Active Problem List   Diagnosis Date Noted  . GI bleeding 09/10/2020  . CKD (chronic kidney disease), stage II   . Hepatocellular carcinoma ([place] 08/09/2020  . Routine general medical  examination at a health care facility 05/06/2020  . Iron deficiency anemia   . Symptomatic anemia 04/15/2020  . Pulmonary hypertension, unspecified ([place]) 03/11/2020  . Asthma, mild intermittent 12/27/2019  . Alcoholic cirrhosis of liver with ascites ([place]) 12/27/2019  . (HFpEF) heart failure with preserved ejection fraction ([place]) 12/24/2018  . CKD (chronic kidney disease), stage III ([place]) 12/18/2017  . Peptic ulcer disease 12/18/2017  . GI bleed 09/29/2017  . Morbid obesity ([place]) 02/05/2016  . S/P CABG x 3 03/31/2014  . Hepatic cirrhosis ([place]) 02/27/2014  . Hyperlipidemia with target LDL less than 70 06/29/2012  . Thrombocytopenia  ([place]) 06/29/2012  . Coronary artery disease involving native coronary artery of native heart without angina pectoris   . OSA (obstructive sleep apnea)   . Type 2 diabetes with complication ([place]) 82/80/0349  . GERD without esophagitis 08/04/2010  . History of gout   . Hypertensive heart disease     Past Surgical History:  Procedure Laterality Date  . ANKLE FRACTURE SURGERY Right 1989   "plate put in"  . APPENDECTOMY  05-16-2004   open  . CIRCUMCISION N/A 09/09/2013   Procedure: CIRCUMCISION ADULT;  Surgeon: Bernestine Amass, MD;  Location: [facility];  Service: Urology;  Laterality: N/A;  . COLECTOMY  05-16-2004  . CORONARY ANGIOPLASTY WITH STENT PLACEMENT  06/28/2012  DR COOPER   PCI W/  X1 DES to [place]. LAD/  LM  40% OSTIAL & 50-60% DISTAL /  50% PROX LCX/  30-40% PROX RCA & 50% MID RCA/   LVEF 65-70%  . CORONARY ARTERY BYPASS GRAFT N/A 03/31/2014   Procedure: CORONARY ARTERY BYPASS GRAFTING (CABG) times 3 using left internal mammary artery and right saphenous vein.;  Surgeon: Melrose Nakayama, MD;  Location: [facility];  Service: Open Heart Surgery;  Laterality: N/A;  . DOBUTAMINE STRESS ECHO  06-08-2012   MODERATE HYPOKINESIS/ ISCHEMIA MID INFERIOR WALL  . ESOPHAGOGASTRODUODENOSCOPY  08/15/2012   Procedure: ESOPHAGOGASTRODUODENOSCOPY (EGD);  Surgeon: Wonda Horner, MD;  Location[facility] ENDOSCOPY;  Service: Endoscopy;  Laterality: N/A;  . ESOPHAGOGASTRODUODENOSCOPY N/A 02/17/2014   Procedure: ESOPHAGOGASTRODUODENOSCOPY (EGD);  Surgeon: Jeryl Columbia, MD;  Location[facility] ENDOSCOPY;  Service: Endoscopy;  Laterality: N/A;  . ESOPHAGOGASTRODUODENOSCOPY N/A 04/17/2020   Procedure: ESOPHAGOGASTRODUODENOSCOPY (EGD);  Surgeon: Ronnette Juniper, MD;  Location: [facility];  Service: Gastroenterology;  Laterality: N/A;  . ESOPHAGOGASTRODUODENOSCOPY N/A 09/11/2020   Procedure: ESOPHAGOGASTRODUODENOSCOPY (EGD);  Surgeon: Clarene Essex, MD;  Location: [facility];  Service: Endoscopy;  Laterality:  N/A;  . ESOPHAGOGASTRODUODENOSCOPY (EGD) WITH PROPOFOL N/A 09/30/2017   Procedure: ESOPHAGOGASTRODUODENOSCOPY (EGD) WITH PROPOFOL;  Surgeon: Wonda Horner, MD;  [facility]COPY;  Service: Endoscopy;  Laterality: N/A;  . ESOPHAGOGASTRODUODENOSCOPY (EGD) WITH PROPOFOL N/A 12/20/2017   Procedure: ESOPHAGOGASTRODUODENOSCOPY (EGD) WITH PROPOFOL;  Surgeon: Clarene Essex, MD;  Location: [facility];  Service: Endoscopy;  Laterality: N/A;  . ESOPHAGOGASTRODUODENOSCOPY (EGD) WITH PROPOFOL N/A 08/10/2020   Procedure: ESOPHAGOGASTRODUODENOSCOPY (EGD) WITH PROPOFOL;  Surgeon: Wilford Corner, MD;  Location: [facility];  Service: Endoscopy;  Laterality: N/A;  . HERNIA REPAIR    . INTRAOPERATIVE TRANSESOPHAGEAL ECHOCARDIOGRAM N/A 03/31/2014   Procedure: INTRAOPERATIVE TRANSESOPHAGEAL ECHOCARDIOGRAM;  Surgeon: Melrose Nakayama, MD;  Location: [facility];  Service: Open Heart Surgery;  Laterality: N/A;  . IR ANGIOGRAM SELECTIVE EACH ADDITIONAL VESSEL  06/16/2020  . IR ANGIOGRAM SELECTIVE EACH ADDITIONAL VESSEL  06/16/2020  . IR ANGIOGRAM SELECTIVE EACH ADDITIONAL VESSEL  06/16/2020  . IR ANGIOGRAM SELECTIVE EACH ADDITIONAL VESSEL  06/16/2020  .  IR ANGIOGRAM SELECTIVE EACH ADDITIONAL VESSEL  06/16/2020  . IR ANGIOGRAM SELECTIVE EACH ADDITIONAL VESSEL  06/16/2020  . IR ANGIOGRAM SELECTIVE EACH ADDITIONAL VESSEL  06/16/2020  . IR ANGIOGRAM SELECTIVE EACH ADDITIONAL VESSEL  06/16/2020  . IR ANGIOGRAM SELECTIVE EACH ADDITIONAL VESSEL  06/16/2020  . IR ANGIOGRAM SELECTIVE EACH ADDITIONAL VESSEL  06/30/2020  . IR ANGIOGRAM SELECTIVE EACH ADDITIONAL VESSEL  06/30/2020  . IR ANGIOGRAM VISCERAL SELECTIVE  06/16/2020  . IR ANGIOGRAM VISCERAL SELECTIVE  06/30/2020  . IR EMBO ARTERIAL NOT HEMORR HEMANG INC GUIDE ROADMAPPING  06/16/2020  . IR EMBO TUMOR ORGAN ISCHEMIA INFARCT INC GUIDE ROADMAPPING  06/30/2020  . IR RADIOLOGIST EVAL & MGMT  05/06/2020  . IR RADIOLOGIST EVAL & MGMT  05/20/2020  . IR US GUIDE VASC ACCESS  RIGHT  06/16/2020  . IR US GUIDE VASC ACCESS RIGHT  06/30/2020  . LAPAROSCOPIC UMBILICAL HERNIA REPAIR W/ MESH  06-06-2011  . LEFT HEART CATHETERIZATION WITH CORONARY ANGIOGRAM N/A 03/28/2014   Procedure: LEFT HEART CATHETERIZATION WITH CORONARY ANGIOGRAM;  Surgeon: Sinclair Grooms, MD;  Location: [facility] CATH LAB;  Service: Cardiovascular;  Laterality: N/A;  . LIPOMA EXCISION Left 08/25/2017   Procedure: EXCISION LIPOMA LEFT POSTERIOR THIGH;  Surgeon: Clovis Riley, MD;  Location: [facility];  Service: General;  Laterality: Left;  . NEPHROLITHOTOMY  1990'S  . OPEN APPENDECTOMY W/ PARTIAL CECECTOMY  05-16-2004  . PERCUTANEOUS CORONARY STENT INTERVENTION (PCI-S) N/A 06/28/2012   Procedure: PERCUTANEOUS CORONARY STENT INTERVENTION (PCI-S);  Surgeon: Sherren Mocha, MD;  Location: [facility] CATH LAB;  Service: Cardiovascular;  Laterality: N/A;       Family History  Problem Relation Age of Onset  . Lung cancer Sister   . Cancer Sister        lung  . Cancer Mother   . Cancer Father        died in his 46s.  . Cancer Brother        lung  . Coronary artery disease Other   . Diabetes Other   . Colon cancer Other   . Cancer Sister        lung    Social History   Tobacco Use  . Smoking status: Former Smoker    Packs/day: 2.00    Years: 40.00    Pack years: 80.00    Types: Cigarettes    Quit date: 08/29/1996    Years since quitting: 24.1  . Smokeless tobacco: Never Used  Vaping Use  . Vaping Use: Never used  Substance Use Topics  . Alcohol use: Yes    Alcohol/week: 8.0 standard drinks    Types: 8 Cans of beer per week    Comment: 2 beers every other day  . Drug use: No    Home Medications Prior to Admission medications   Medication Sig Start Date End Date Taking? Authorizing Provider  Accu-Chek Softclix Lancets lancets USE AS DIRECTED TO TEST BLOOD SUGAR FOUR TIMES DAILY 03/13/20   [name], MD  albuterol (VENTOLIN HFA) 108 (90 Base) MCG/ACT inhaler Inhale 2 puffs into the  lungs every 6 (six) hours as needed for wheezing or shortness of breath. 03/12/20   [name], MD  atorvastatin (LIPITOR) 40 MG tablet TAKE 1 TABLET BY MOUTH DAILY AT 6 PM 09/30/20   [name], MD  blood glucose meter kit and supplies Dispense based on patient and insurance preference. Use up to four times daily as directed. (FOR ICD-10 E10.9, E11.9). 03/12/20   Sharlet Salina,  Real Cons, MD  ferrous sulfate 325 (65 FE) MG EC tablet Take 1 tablet (325 mg total) by mouth daily with breakfast. 01/16/20   [name], MD  gabapentin (NEURONTIN) 100 MG capsule TAKE 1 CAPSULE(100 MG) BY MOUTH TWICE DAILY Patient taking differently: Take 100 mg by mouth 2 (two) times daily. 08/03/20   [name], MD  glimepiride (AMARYL) 2 MG tablet Take 2 mg by mouth daily. 07/13/20   [provider]  metFORMIN (GLUCOPHAGE) 850 MG tablet TAKE 1 TABLET(850 MG) BY MOUTH TWICE DAILY WITH A MEAL Patient taking differently: Take 850 mg by mouth 2 (two) times daily with a meal. 06/16/20   [name], MD  nitroGLYCERIN (NITROSTAT) 0.4 MG SL tablet Place 1 tablet (0.4 mg total) under the tongue every 5 (five) minutes as needed for chest pain. 06/08/16   Nahser, Wonda Cheng, MD  pantoprazole (PROTONIX) 40 MG tablet Take 1 tablet (40 mg total) by mouth 2 (two) times daily. 09/12/20   Pokhrel, Corrie Mckusick, MD  potassium chloride (KLOR-CON) 20 MEQ tablet Take 1 tablet (20 mEq total) by mouth daily. 08/18/20   Danford, Suann Larry, MD  Sennosides (EX-LAX PO) Take 1 tablet by mouth daily as needed (constipation).    [provider]  spironolactone (ALDACTONE) 25 MG tablet Take 1 tablet (25 mg total) by mouth daily. 08/18/20   Danford, Suann Larry, MD  torsemide (DEMADEX) 20 MG tablet Take 2 tablets (40 mg total) by mouth 2 (two) times daily. 08/18/20   Danford, Suann Larry, MD  umeclidinium-vilanterol (ANORO ELLIPTA) 62.5-25 MCG/INH AEPB Inhale 1 puff into the lungs daily.  03/12/20   [name], MD    Allergies    Patient has no known allergies.  Review of Systems   Review of Systems  Respiratory: Positive for shortness of breath.   All other systems reviewed and are negative.   Physical Exam Updated Vital Signs BP (!) 144/52   Pulse 76   Temp 99 F (37.2 C) (Oral)   Resp (!) 26   SpO2 100%   Physical Exam Vitals and nursing note reviewed.  Constitutional:      General: He is not in acute distress.    Appearance: He is well-developed and well-nourished. He is obese.  HENT:     Head: Atraumatic.  Eyes:     Conjunctiva/sclera: Conjunctivae normal.  Cardiovascular:     Rate and Rhythm: Normal rate and regular rhythm.  Pulmonary:     Effort: Pulmonary effort is normal.     Breath sounds: Decreased breath sounds present. No wheezing, rhonchi or rales.  Chest:     Chest wall: No tenderness.  Abdominal:     Palpations: Abdomen is soft.     Tenderness: There is abdominal tenderness (Very mild epigastric tenderness no guarding rebound tenderness).  Genitourinary:    CommentsEarney Navy, NT available to chaperone.  Normal rectal tone, no obvious mass, no stool impaction.  Dark color stool noted on glove. Musculoskeletal:     Cervical back: Neck supple.  Skin:    Coloration: Skin is pale.     Findings: No rash.  Neurological:     Mental Status: He is alert.  Psychiatric:        Mood and Affect: Mood and affect normal.     ED Results / Procedures / Treatments   Labs (all labs ordered are listed, but only abnormal results are displayed) Labs Reviewed  COMPREHENSIVE METABOLIC PANEL - Abnormal; Notable for the following  components:      Result Value   CO2 21 (*)    Glucose, Bld 108 (*)    BUN 46 (*)    Creatinine, Ser 1.39 (*)    Calcium 8.5 (*)    Alkaline Phosphatase 142 (*)    GFR, Estimated 53 (*)    All other components within normal limits  CBC - Abnormal; Notable for the following components:   RBC 2.48 (*)     Hemoglobin 4.9 (*)    HCT 18.7 (*)    MCV 75.4 (*)    MCH 19.8 (*)    MCHC 26.2 (*)    RDW 19.7 (*)    All other components within normal limits  POC OCCULT BLOOD, ED - Abnormal; Notable for the following components:   Fecal Occult Bld POSITIVE (*)    All other components within normal limits  SARS CORONAVIRUS 2 (TAT 6-24 HRS)  BRAIN NATRIURETIC PEPTIDE  TYPE AND SCREEN  PREPARE RBC (CROSSMATCH)  TROPONIN I (HIGH SENSITIVITY)    EKG EKG Interpretation  Date/Time:  Thursday October 08 2020 16:12:36 EST Ventricular Rate:  75 PR Interval:  202 QRS Duration: 100 QT Interval:  414 QTC Calculation: 462 R Axis:   103 Text Interpretation: Undetermined rhythm Rightward axis Non-specific ST-t changes Prolonged QT Confirmed by Lajean Saver [id number]) on 10/08/2020 5:26:10 PM   Radiology DG Chest 2 View  Result Date: 10/08/2020 CLINICAL DATA:  Shortness of breath. EXAM: CHEST - 2 VIEW COMPARISON:  August 16, 2020. FINDINGS: Stable cardiomegaly. No pneumothorax or pleural effusion is noted. Status post coronary bypass graft. Mild bilateral perihilar and basilar interstitial densities are noted which may represent scarring, but acute superimposed edema cannot be excluded. Bony thorax is unremarkable. IMPRESSION: Mild bilateral perihilar and basilar interstitial densities are noted which may represent scarring, but acute superimposed edema cannot be excluded. Electronically Signed   By: Marijo Conception M.D.   On: 10/08/2020 16:39    Procedures .Critical Care Performed by: Domenic Moras, PA-C Authorized by: Domenic Moras, PA-C   Critical care provider statement:    Critical care time (minutes):  37   Critical care was time spent personally by me on the following activities:  Discussions with consultants, evaluation of patient's response to treatment, examination of patient, ordering and performing treatments and interventions, ordering and review of laboratory studies, ordering and review of  radiographic studies, pulse oximetry, re-evaluation of patient's condition, obtaining history from patient or surrogate and review of old charts     Medications Ordered in ED Medications  0.9 %  sodium chloride infusion (has no administration in time range)  pantoprazole (PROTONIX) injection 40 mg (has no administration in time range)  octreotide (SANDOSTATIN) 500 mcg in sodium chloride 0.9 % 250 mL (2 mcg/mL) infusion (has no administration in time range)    ED Course  I have reviewed the triage vital signs and the nursing notes.  Pertinent labs & imaging results that were available during my care of the patient were reviewed by me and considered in my medical decision making (see chart for details).    MDM Rules/Calculators/A&P                          BP (!) 140/58 (BP Location: Right Arm)   Pulse 75   Temp 98 F (36.7 C) (Oral)   Resp 14   SpO2 100%   Final Clinical Impression(s) / ED Diagnoses Final diagnoses:  Symptomatic anemia  UGIB (upper gastrointestinal bleed)    Rx / DC Orders ED Discharge Orders    None     6:13 PM Patient with known history of esophageal varices and recurrent upper GI bleed requiring blood transfusion in the past is here with generalized weakness for the past 2 weeks.  Labs remarkable for hemoglobin of 4.9.  Fecal occult blood test is positive.  BUN 46, creatinine 1.39.  Very mild epigastric tenderness on exam.  Decreased breath sounds on auscultation.  I have ordered blood transfusion, I have consulted on-call GI specialist, Dr. Ruffin Pyo, who recommends starting patient on octreotide as well and will consult medicine for admission.  6:57 PM Appreciate consultation from [facility] Dr. Jonelle Sidle who agrees to see and admit pt for further care.  Care discussed with Dr. Johnney Killian.    Domenic Moras, PA-C 10/08/20 1857    Hayden Rasmussen, MD 10/10/20 [phone]

## 2020-10-08 NOTE — ED Triage Notes (Signed)
Patient here with shortness of breath and weakness, states he feels the same he did a few weeks ago when he ended up needing a blood transfusion. Patient alert and oriented and in no apparent distress.

## 2020-10-08 NOTE — ED Notes (Addendum)
RN stopped fluids running at 5mL/ hr due to pt being extremely swollen in lower limbs. Pitting edema +4. Pt states he takes a fluid pill at home due to CHF. MD aware.

## 2020-10-09 ENCOUNTER — Inpatient Hospital Stay (HOSPITAL_COMMUNITY): Payer: Medicare HMO | Admitting: Anesthesiology

## 2020-10-09 ENCOUNTER — Inpatient Hospital Stay (HOSPITAL_COMMUNITY): Payer: Medicare HMO

## 2020-10-09 ENCOUNTER — Other Ambulatory Visit: Payer: Self-pay

## 2020-10-09 ENCOUNTER — Encounter (HOSPITAL_COMMUNITY)
Admission: EM | Disposition: A | Discharge status: Home / Self Care | Payer: Self-pay | Source: Home / Self Care | Attending: Internal Medicine

## 2020-10-09 ENCOUNTER — Encounter (HOSPITAL_COMMUNITY): Payer: Self-pay | Admitting: Internal Medicine

## 2020-10-09 DIAGNOSIS — K219 Gastro-esophageal reflux disease without esophagitis: Secondary | ICD-10-CM

## 2020-10-09 DIAGNOSIS — K7031 Alcoholic cirrhosis of liver with ascites: Secondary | ICD-10-CM | POA: Diagnosis not present

## 2020-10-09 DIAGNOSIS — I5032 Chronic diastolic (congestive) heart failure: Secondary | ICD-10-CM | POA: Diagnosis not present

## 2020-10-09 DIAGNOSIS — D649 Anemia, unspecified: Secondary | ICD-10-CM | POA: Diagnosis not present

## 2020-10-09 DIAGNOSIS — K921 Melena: Secondary | ICD-10-CM

## 2020-10-09 DIAGNOSIS — N182 Chronic kidney disease, stage 2 (mild): Secondary | ICD-10-CM | POA: Diagnosis not present

## 2020-10-09 HISTORY — PX: ESOPHAGOGASTRODUODENOSCOPY: SHX1529

## 2020-10-09 HISTORY — PX: ESOPHAGOGASTRODUODENOSCOPY: SHX5428

## 2020-10-09 LAB — CBC
HCT: 21.2 % — ABNORMAL LOW (ref 39.0–52.0)
HCT: 26 % — ABNORMAL LOW (ref 39.0–52.0)
HCT: 27.2 % — ABNORMAL LOW (ref 39.0–52.0)
Hemoglobin: 6.2 g/dL — CL (ref 13.0–17.0)
Hemoglobin: 7.3 g/dL — ABNORMAL LOW (ref 13.0–17.0)
Hemoglobin: 7.5 g/dL — ABNORMAL LOW (ref 13.0–17.0)
MCH: 22.2 pg — ABNORMAL LOW (ref 26.0–34.0)
MCH: 22.1 pg — ABNORMAL LOW (ref 26.0–34.0)
MCH: 22.3 pg — ABNORMAL LOW (ref 26.0–34.0)
MCHC: 29.2 g/dL — ABNORMAL LOW (ref 30.0–36.0)
MCHC: 28.1 g/dL — ABNORMAL LOW (ref 30.0–36.0)
MCHC: 27.6 g/dL — ABNORMAL LOW (ref 30.0–36.0)
MCV: 76 fL — ABNORMAL LOW (ref 80.0–100.0)
MCV: 78.5 fL — ABNORMAL LOW (ref 80.0–100.0)
MCV: 80.7 fL (ref 80.0–100.0)
Platelets: 128 10*3/uL — ABNORMAL LOW (ref 150–400)
Platelets: 141 10*3/uL — ABNORMAL LOW (ref 150–400)
Platelets: 139 10*3/uL — ABNORMAL LOW (ref 150–400)
RBC: 2.79 MIL/uL — ABNORMAL LOW (ref 4.22–5.81)
RBC: 3.31 MIL/uL — ABNORMAL LOW (ref 4.22–5.81)
RBC: 3.37 MIL/uL — ABNORMAL LOW (ref 4.22–5.81)
RDW: 20.2 % — ABNORMAL HIGH (ref 11.5–15.5)
RDW: 20.2 % — ABNORMAL HIGH (ref 11.5–15.5)
RDW: 20.1 % — ABNORMAL HIGH (ref 11.5–15.5)
WBC: 3.4 10*3/uL — ABNORMAL LOW (ref 4.0–10.5)
WBC: 3.8 10*3/uL — ABNORMAL LOW (ref 4.0–10.5)
WBC: 4.3 10*3/uL (ref 4.0–10.5)
nRBC: 0 % (ref 0.0–0.2)
nRBC: 0 % (ref 0.0–0.2)
nRBC: 0 % (ref 0.0–0.2)

## 2020-10-09 LAB — CBG MONITORING, ED
Glucose-Capillary: 116 mg/dL — ABNORMAL HIGH (ref 70–99)
Glucose-Capillary: 84 mg/dL (ref 70–99)
Glucose-Capillary: 92 mg/dL (ref 70–99)

## 2020-10-09 LAB — GLUCOSE, CAPILLARY
Glucose-Capillary: 85 mg/dL (ref 70–99)
Glucose-Capillary: 133 mg/dL — ABNORMAL HIGH (ref 70–99)

## 2020-10-09 LAB — POCT I-STAT, CHEM 8
BUN: 39 mg/dL — ABNORMAL HIGH (ref 8–23)
Calcium, Ion: 1.2 mmol/L (ref 1.15–1.40)
Chloride: 105 mmol/L (ref 98–111)
Creatinine, Ser: 1.4 mg/dL — ABNORMAL HIGH (ref 0.61–1.24)
Glucose, Bld: 94 mg/dL (ref 70–99)
HCT: 25 % — ABNORMAL LOW (ref 39.0–52.0)
Hemoglobin: 8.5 g/dL — ABNORMAL LOW (ref 13.0–17.0)
Potassium: 4.2 mmol/L (ref 3.5–5.1)
Sodium: 140 mmol/L (ref 135–145)
TCO2: 24 mmol/L (ref 22–32)

## 2020-10-09 LAB — COMPREHENSIVE METABOLIC PANEL
ALT: 17 U/L (ref 0–44)
AST: 21 U/L (ref 15–41)
Albumin: 3.4 g/dL — ABNORMAL LOW (ref 3.5–5.0)
Alkaline Phosphatase: 120 U/L (ref 38–126)
Anion gap: 8 (ref 5–15)
BUN: 41 mg/dL — ABNORMAL HIGH (ref 8–23)
CO2: 23 mmol/L (ref 22–32)
Calcium: 8.5 mg/dL — ABNORMAL LOW (ref 8.9–10.3)
Chloride: 105 mmol/L (ref 98–111)
Creatinine, Ser: 1.41 mg/dL — ABNORMAL HIGH (ref 0.61–1.24)
GFR, Estimated: 52 mL/min — ABNORMAL LOW (ref >60)
Glucose, Bld: 99 mg/dL (ref 70–99)
Potassium: 3.9 mmol/L (ref 3.5–5.1)
Sodium: 136 mmol/L (ref 135–145)
Total Bilirubin: 1.9 mg/dL — ABNORMAL HIGH (ref 0.3–1.2)
Total Protein: 6.4 g/dL — ABNORMAL LOW (ref 6.5–8.1)

## 2020-10-09 LAB — PROTIME-INR
INR: 1.2 (ref 0.8–1.2)
Prothrombin Time: 14.5 seconds (ref 11.4–15.2)

## 2020-10-09 LAB — PREPARE RBC (CROSSMATCH)

## 2020-10-09 LAB — SARS CORONAVIRUS 2 (TAT 6-24 HRS): SARS Coronavirus 2: NEGATIVE

## 2020-10-09 SURGERY — EGD (ESOPHAGOGASTRODUODENOSCOPY)
Anesthesia: Monitor Anesthesia Care

## 2020-10-09 MED ORDER — SODIUM CHLORIDE 0.9 % IV SOLN: INTRAVENOUS | Status: DC

## 2020-10-09 MED ORDER — LIDOCAINE 2% (20 MG/ML) 5 ML SYRINGE
INTRAMUSCULAR | Status: DC | PRN
  Administered 2020-10-09: 40 mg via INTRAVENOUS

## 2020-10-09 MED ORDER — UMECLIDINIUM-VILANTEROL 62.5-25 MCG/INH IN AEPB
1.0000 | INHALATION_SPRAY | Freq: Every day | RESPIRATORY_TRACT | Status: DC
  Administered 2020-10-10: 1 via RESPIRATORY_TRACT
  Filled 2020-10-09: qty 14

## 2020-10-09 MED ORDER — ATORVASTATIN CALCIUM 40 MG PO TABS
40.0000 mg | ORAL_TABLET | Freq: Every day | ORAL | Status: DC
  Administered 2020-10-09 – 2020-10-10 (×2): 40 mg via ORAL
  Filled 2020-10-09 (×2): qty 1

## 2020-10-09 MED ORDER — SODIUM CHLORIDE 0.9% IV SOLUTION: Freq: Once | INTRAVENOUS | Status: DC

## 2020-10-09 MED ORDER — ALBUTEROL SULFATE HFA 108 (90 BASE) MCG/ACT IN AERS: 2.0000 | INHALATION_SPRAY | Freq: Four times a day (QID) | RESPIRATORY_TRACT | Status: DC | PRN

## 2020-10-09 MED ORDER — PROPOFOL 500 MG/50ML IV EMUL
INTRAVENOUS | Status: DC | PRN
  Administered 2020-10-09: 150 ug/kg/min via INTRAVENOUS

## 2020-10-09 MED ORDER — SODIUM CHLORIDE 0.9 % IV SOLN
1.0000 g | INTRAVENOUS | Status: DC
  Administered 2020-10-09: 1 g via INTRAVENOUS
  Filled 2020-10-09: qty 10

## 2020-10-09 MED ORDER — NITROGLYCERIN 0.4 MG SL SUBL: 0.4000 mg | SUBLINGUAL_TABLET | SUBLINGUAL | Status: DC | PRN

## 2020-10-09 NOTE — Anesthesia Postprocedure Evaluation (Signed)
Anesthesia Post Note  Patient: [name]  Procedure(s) Performed: ESOPHAGOGASTRODUODENOSCOPY (EGD) (N/A )     Patient location during evaluation: PACU Anesthesia Type: MAC Level of consciousness: awake and alert Pain management: pain level controlled Vital Signs Assessment: post-procedure vital signs reviewed and stable Respiratory status: spontaneous breathing, nonlabored ventilation, respiratory function stable and patient connected to nasal cannula oxygen Cardiovascular status: stable and blood pressure returned to baseline Postop Assessment: no apparent nausea or vomiting Anesthetic complications: no   No complications documented.  Last Vitals:  Vitals:   10/09/20 1209 10/09/20 1312  BP: (!) 144/49 (!) 141/46  Pulse:  73  Resp:  14  Temp:  36.7 C  SpO2: 100% 99%    Last Pain:  Vitals:   10/09/20 1312  TempSrc: Oral  PainSc:                  Karly Pitter

## 2020-10-09 NOTE — ED Notes (Signed)
Pt transported to ENDO

## 2020-10-09 NOTE — Op Note (Signed)
[facility] Patient Name: [name] Procedure Date : 10/09/2020 MRN: [id number] Attending MD: Otis Brace , MD Date of Birth: [birth year] CSN: [id number] Age: 75 Admit Type: Inpatient Procedure:                Upper GI endoscopy Indications:              Iron deficiency anemia, Follow-up of esophageal                            varices Providers:                Otis Brace, MD, Burtis Junes, RN, Benetta Spar, Technician Referring MD:              Medicines:                Sedation Administered by an Anesthesia Professional Complications:            No immediate complications. Estimated Blood Loss:     Estimated blood loss: none. Procedure:                Pre-Anesthesia Assessment:                           - Prior to the procedure, a History and Physical                            was performed, and patient medications and                            allergies were reviewed. The patient's tolerance of                            previous anesthesia was also reviewed. The risks                            and benefits of the procedure and the sedation                            options and risks were discussed with the patient.                            All questions were answered, and informed consent                            was obtained. Prior Anticoagulants: The patient has                            taken Coumadin (warfarin). ASA Grade Assessment:                            III - A patient with severe systemic disease. After  reviewing the risks and benefits, the patient was                            deemed in satisfactory condition to undergo the                            procedure.                           After obtaining informed consent, the endoscope was                            passed under direct vision. Throughout the                            procedure, the patient's blood pressure,  pulse, and                            oxygen saturations were monitored continuously. The                            [id number]-H190 ([id number] Olympus gastroscope was                            introduced through the mouth, and advanced to the                            second part of duodenum. The upper GI endoscopy was                            accomplished without difficulty. The patient                            tolerated the procedure well. Scope In: Scope Out: Findings:      Grade I varices were found in the distal esophagus.      Type 1 isolated gastric varices (IGV1, varices located in the fundus)       with no bleeding were found in the cardia. There were no stigmata of       recent bleeding.      Mild portal hypertensive gastropathy was found in the gastric body.      The duodenal bulb, first portion of the duodenum and second portion of       the duodenum were normal. Impression:               - Grade I esophageal varices.                           - Type 1 isolated gastric varices (IGV1, varices                            located in the fundus), without bleeding.                           - Portal hypertensive gastropathy.                           -  Normal duodenal bulb, first portion of the                            duodenum and second portion of the duodenum.                           - No specimens collected. Recommendation:           - Return patient to hospital ward for ongoing care.                           - Full liquid diet.                           - Continue present medications. Procedure Code(s):        --- Professional ---                           [phone], Esophagogastroduodenoscopy, flexible,                            transoral; diagnostic, including collection of                            specimen(s) by brushing or washing, when performed                            (separate procedure) Diagnosis Code(s):        --- Professional ---                            I85.00, Esophageal varices without bleeding                           I86.4, Gastric varices                           K76.6, Portal hypertension                           K31.89, Other diseases of stomach and duodenum                           D50.9, Iron deficiency anemia, unspecified CPT copyright 2019 [employer]. All rights reserved. The codes documented in this report are preliminary and upon coder review may  be revised to meet current compliance requirements. Otis Brace, MD Otis Brace, MD 10/09/2020 1:13:52 PM Number of Addenda: 0

## 2020-10-09 NOTE — Consult Note (Signed)
Referring Provider: Domenic Moras (ED) Primary Care Physician:  [name], MD Primary Gastroenterologist:  [facility] (prior patient of Dr. Penelope Coop)  Reason for Consultation:  Anemia  HPI: [name] is a 75 y.o. male with past medical history noted below to includehepatic cirrhosis,HCC, history of GI bleed with(gastric AVMs in 2019, portal hypertensive gastropathy and grade 1 esophageal varices in 2021), GERD/Barrett's esophagu, CAD s/p CABG, HFpEF (EF 55-60%as of12/2020)presenting for consultation of symptomatic anemia.  Patient states he has had progressive shortness of breath and presented to the ED.  Denies seeing melena or hematochezia.  States last BM was yesterday.  Reports intermittent epigastric discomfort which he states feels like gas pain.  Denies nausea or vomiting.  He denies recent alcohol, NSAID, or ASA use.  He cannot tell me whether or not he is on a blood thinner, though none listed in chart.  Patient able to tell me situation and location but not oriented to time (reports year as 39, then when asked again, reports as [place]). Called patient's wife who states he has intermittent confusion.  She states patient has not had any recent alcohol.  She also denies seeing melena or hematochezia.  Patient has not been seen in the office recently, no-showed to last appointment.  EGD 09/11/20: Type 1 isolated gastric varices (IGV1, varices located in the fundus) without bleeding,  Portal hypertensive gastropathy, 2 non-bleeding ?angiodysplastic lesions in the stomach.  EGD 07/2020 was pertinent for portal hypertensive gastropathy, friable gastric mucosa, Grade I esophageal varices, and type 1 isolated gastric varices (IGV1, varices located in the fundus), without bleeding.  EGD was 11/2017 and was pertinent for 5 nonbleeding AVMss/p APCas well as nonbleeding duodenal diverticulum.  Last colonoscopy was 07/2013 was pertinent for 2 nonbleeding colonic AVMsand 3  hyperplastic polyps.  Past Medical History:  Diagnosis Date  . Arthritis    "joints tighten up sometimes" (03/27/2104)  . Carcinoma of liver, hepatocellular ([place]) 06/30/2020  . CHF (congestive heart failure) ([place])   . Chronic lower back pain   . Coronary artery disease    a. 05/2012 Cath/PCI: LM 40ost, 50-60d, LAD 99p ruptured plaque (3.0x28 DES), LCX 50p/m, RCA 30-40p, 77m EF 65-70%.  . Coronary artery disease involving native coronary artery of native heart without angina pectoris    Severe left main disease at catheterization July 2015  CABG x3 with a LIMA to the LAD, SVG to the OM, SVG to the PDA on 03/31/14. EF 60% by cath.   . Diastolic heart failure ([facility]   . GERD without esophagitis 08/04/2010  . Hepatic cirrhosis ([facility]    a. Dx 01/2014 - CT a/p   . History of blood transfusion    "related to bleeding ulcers"  . History of concussion    1976--  NO RESIDUAL  . History of GI bleed    a. UGIB 07/2012;  b. 01/2014 admission with GIB/FOB stool req 1U prbc's->EGD showed portal gastropathy, barrett's esoph, and chronic active h. pylori gastritis.  .Marland KitchenHistory of gout    2007 &  2008  LEFT LEG-- NO ISSUE SINCE  . Hyperlipidemia   . Iron deficiency anemia   . Kidney stones   . OA (osteoarthritis of spine)    LOWER BACK--  INTERMITTANT LEFT LEG NUMBNESS  . OSA (obstructive sleep apnea)    PULMOLOGIST-  DR CLANCE--  MODERATE OSA  STARTED CPAP 2012--  BUT CURRENTLY HAS NOT USED PAST 6 MONTHS  . Phimosis    a. s/p circumcision 2015.  .Marland Kitchen  Type 2 diabetes mellitus ([place])   . Unspecified essential hypertension     Past Surgical History:  Procedure Laterality Date  . ANKLE FRACTURE SURGERY Right 1989   "plate put in"  . APPENDECTOMY  05-16-2004   open  . CIRCUMCISION N/A 09/09/2013   Procedure: CIRCUMCISION ADULT;  Surgeon: Bernestine Amass, MD;  [facility];  Service: Urology;  Laterality: N/A;  . COLECTOMY  05-16-2004  . CORONARY ANGIOPLASTY WITH STENT PLACEMENT   06/28/2012  DR CO[place]   PCI W/  X1 DES to [place]. LAD/  LM  40% OSTIAL & 50-60% DISTAL /  50% PROX LCX/  30-40% PROX RCA & 50% MID RCA/   LVEF 65-70%  . CORONARY ARTERY BYPASS GRAFT N/A 03/31/2014   Procedure: CORONARY ARTERY BYPASS GRAFTING (CABG) times 3 using left internal mammary artery and right saphenous vein.;  Surgeon: Melrose Nakayama, MD;  Location: [place];  Service: Open Heart Surgery;  Laterality: N/A;  . DOBUTAMINE STRESS ECHO  06-08-2012   MODERATE HYPOKINESIS/ ISCHEMIA MID INFERIOR WALL  . ESOPHAGOGASTRODUODENOSCOPY  08/15/2012   Procedure: ESOPHAGOGASTRODUODENOSCOPY (EGD);  Surgeon: Wonda Horner, MD;  Location[facility];  Service: Endoscopy;  Laterality: N/A;  . ESOPHAGOGASTRODUODENOSCOPY N/A 02/17/2014   Procedure: ESOPHAGOGASTRODUODENOSCOPY (EGD);  Surgeon: Jeryl Columbia, MD;  Location: [facility] ENDOSCOPY;  Service: Endoscopy;  Laterality: N/A;  . ESOPHAGOGASTRODUODENOSCOPY N/A 04/17/2020   Procedure: ESOPHAGOGASTRODUODENOSCOPY (EGD);  Surgeon: Ronnette Juniper, MD;  Location: [place];  Service: Gastroenterology;  Laterality: N/A;  . ESOPHAGOGASTRODUODENOSCOPY N/A 09/11/2020   Procedure: ESOPHAGOGASTRODUODENOSCOPY (EGD);  Surgeon: Clarene Essex, MD;  Location: [facility];  Service: Endoscopy;  Laterality: N/A;  . ESOPHAGOGASTRODUODENOSCOPY (EGD) WITH PROPOFOL N/A 09/30/2017   Procedure: ESOPHAGOGASTRODUODENOSCOPY (EGD) WITH PROPOFOL;  Surgeon: Wonda Horner, MD;  Location: [facility] END[facility]COPY;  Service: Endoscopy;  Laterality: N/A;  . ESOPHAGOGASTRODUODENOSCOPY (EGD) WITH PROPOFOL N/A 12/20/2017   Procedure: ESOPHAGOGASTRODUODENOSCOPY (EGD) WITH PROPOFOL;  Surgeon: Clarene Essex, MD;  Location: [place];  Service: Endoscopy;  Laterality: N/A;  . ESOPHAGOGASTRODUODENOSCOPY (EGD) WITH PROPOFOL N/A 08/10/2020   Procedure: ESOPHAGOGASTRODUODENOSCOPY (EGD) WITH PROPOFOL;  Surgeon: Wilford Corner, MD;  Location: [place];  Service: Endoscopy;  Laterality: N/A;  . HERNIA REPAIR    .  INTRAOPERATIVE TRANSESOPHAGEAL ECHOCARDIOGRAM N/A 03/31/2014   Procedure: INTRAOPERATIVE TRANSESOPHAGEAL ECHOCARDIOGRAM;  Surgeon: Melrose Nakayama, MD;  Location: [place];  Service: Open Heart Surgery;  Laterality: N/A;  . IR ANGIOGRAM SELECTIVE EACH ADDITIONAL VESSEL  06/16/2020  . IR ANGIOGRAM SELECTIVE EACH ADDITIONAL VESSEL  06/16/2020  . IR ANGIOGRAM SELECTIVE EACH ADDITIONAL VESSEL  06/16/2020  . IR ANGIOGRAM SELECTIVE EACH ADDITIONAL VESSEL  06/16/2020  . IR ANGIOGRAM SELECTIVE EACH ADDITIONAL VESSEL  06/16/2020  . IR ANGIOGRAM SELECTIVE EACH ADDITIONAL VESSEL  06/16/2020  . IR ANGIOGRAM SELECTIVE EACH ADDITIONAL VESSEL  06/16/2020  . IR ANGIOGRAM SELECTIVE EACH ADDITIONAL VESSEL  06/16/2020  . IR ANGIOGRAM SELECTIVE EACH ADDITIONAL VESSEL  06/16/2020  . IR ANGIOGRAM SELECTIVE EACH ADDITIONAL VESSEL  06/30/2020  . IR ANGIOGRAM SELECTIVE EACH ADDITIONAL VESSEL  06/30/2020  . IR ANGIOGRAM VISCERAL SELECTIVE  06/16/2020  . IR ANGIOGRAM VISCERAL SELECTIVE  06/30/2020  . IR EMBO ARTERIAL NOT HEMORR HEMANG INC GUIDE ROADMAPPING  06/16/2020  . IR EMBO TUMOR ORGAN ISCHEMIA INFARCT INC GUIDE ROADMAPPING  06/30/2020  . IR RADIOLOGIST EVAL & MGMT  05/06/2020  . IR RADIOLOGIST EVAL & MGMT  05/20/2020  . IR US GUIDE VASC ACCESS RIGHT  06/16/2020  . IR US GUIDE VASC  ACCESS RIGHT  06/30/2020  . LAPAROSCOPIC UMBILICAL HERNIA REPAIR W/ MESH  06-06-2011  . LEFT HEART CATHETERIZATION WITH CORONARY ANGIOGRAM N/A 03/28/2014   Procedure: LEFT HEART CATHETERIZATION WITH CORONARY ANGIOGRAM;  Surgeon: Sinclair Grooms, MD;  Location: [facility] CATH LAB;  Service: Cardiovascular;  Laterality: N/A;  . LIPOMA EXCISION Left 08/25/2017   Procedure: EXCISION LIPOMA LEFT POSTERIOR THIGH;  Surgeon: Clovis Riley, MD;  Location: [place];  Service: General;  Laterality: Left;  . NEPHROLITHOTOMY  1990'S  . OPEN APPENDECTOMY W/ PARTIAL CECECTOMY  05-16-2004  . PERCUTANEOUS CORONARY STENT INTERVENTION (PCI-S) N/A 06/28/2012    Procedure: PERCUTANEOUS CORONARY STENT INTERVENTION (PCI-S);  Surgeon: Sherren Mocha, MD;  Location: [facility] CATH LAB;  Service: Cardiovascular;  Laterality: N/A;    Prior to Admission medications   Medication Sig Start Date End Date Taking? Authorizing Provider  Accu-Chek Softclix Lancets lancets USE AS DIRECTED TO TEST BLOOD SUGAR FOUR TIMES DAILY 03/13/20   [name], MD  albuterol (VENTOLIN HFA) 108 (90 Base) MCG/ACT inhaler Inhale 2 puffs into the lungs every 6 (six) hours as needed for wheezing or shortness of breath. 03/12/20   [name], MD  atorvastatin (LIPITOR) 40 MG tablet TAKE 1 TABLET BY MOUTH DAILY AT 6 PM 09/30/20   [name], MD  blood glucose meter kit and supplies Dispense based on patient and insurance preference. Use up to four times daily as directed. (FOR ICD-10 E10.9, E11.9). 03/12/20   [name], MD  ferrous sulfate 325 (65 FE) MG EC tablet Take 1 tablet (325 mg total) by mouth daily with breakfast. 01/16/20   [name], MD  gabapentin (NEURONTIN) 100 MG capsule TAKE 1 CAPSULE(100 MG) BY MOUTH TWICE DAILY Patient taking differently: Take 100 mg by mouth 2 (two) times daily. 08/03/20   [name], MD  glimepiride (AMARYL) 2 MG tablet Take 2 mg by mouth daily. 07/13/20   [provider]  metFORMIN (GLUCOPHAGE) 850 MG tablet TAKE 1 TABLET(850 MG) BY MOUTH TWICE DAILY WITH A MEAL Patient taking differently: Take 850 mg by mouth 2 (two) times daily with a meal. 06/16/20   [name], MD  nitroGLYCERIN (NITROSTAT) 0.4 MG SL tablet Place 1 tablet (0.4 mg total) under the tongue every 5 (five) minutes as needed for chest pain. 06/08/16   Nahser, Wonda Cheng, MD  pantoprazole (PROTONIX) 40 MG tablet Take 1 tablet (40 mg total) by mouth 2 (two) times daily. 09/12/20   Pokhrel, Corrie Mckusick, MD  potassium chloride (KLOR-CON) 20 MEQ tablet Take 1 tablet (20 mEq total) by mouth daily. 08/18/20   Danford, Suann Larry,  MD  Sennosides (EX-LAX PO) Take 1 tablet by mouth daily as needed (constipation).    [provider]  spironolactone (ALDACTONE) 25 MG tablet Take 1 tablet (25 mg total) by mouth daily. 08/18/20   Danford, Suann Larry, MD  torsemide (DEMADEX) 20 MG tablet Take 2 tablets (40 mg total) by mouth 2 (two) times daily. 08/18/20   Danford, Suann Larry, MD  umeclidinium-vilanterol (ANORO ELLIPTA) 62.5-25 MCG/INH AEPB Inhale 1 puff into the lungs daily. 03/12/20   [name], MD    Scheduled Meds: . sodium chloride   Intravenous Once  . insulin aspart  0-15 Units Subcutaneous TID WC  . insulin aspart  0-5 Units Subcutaneous QHS  . [START ON 10/12/2020] pantoprazole  40 mg Intravenous Q12H   Continuous Infusions: . lactated ringers    . octreotide  (SANDOSTATIN)  IV infusion 50 mcg/hr (10/08/20 2059)  . pantoprozole (PROTONIX) infusion Stopped (10/09/20 0511)   PRN Meds:.  Allergies as of 10/08/2020  . (No Known Allergies)    Family History  Problem Relation Age of Onset  . Lung cancer Sister   . Cancer Sister        lung  . Cancer Mother   . Cancer Father        died in his 52s.  . Cancer Brother        lung  . Coronary artery disease Other   . Diabetes Other   . Colon cancer Other   . Cancer Sister        lung    Social History   Socioeconomic History  . Marital status: Married    Spouse name: Not on file  . Number of children: Not on file  . Years of education: Not on file  . Highest education level: Not on file  Occupational History  . Not on file  Tobacco Use  . Smoking status: Former Smoker    Packs/day: 2.00    Years: 40.00    Pack years: 80.00    Types: Cigarettes    Quit date: 08/29/1996    Years since quitting: 24.1  . Smokeless tobacco: Never Used  Vaping Use  . Vaping Use: Never used  Substance and Sexual Activity  . Alcohol use: Yes    Alcohol/week: 8.0 standard drinks    Types: 8 Cans of beer per week    Comment: 2 beers  every other day  . Drug use: No  . Sexual activity: Yes  Other Topics Concern  . Not on file  Social History Narrative   Lives in [place] with wife, son, and son's family.   Social Determinants of Health   Financial Resource Strain: Not on file  Food Insecurity: Not on file  Transportation Needs: Not on file  Physical Activity: Not on file  Stress: Not on file  Social Connections: Not on file  Intimate Partner Violence: Not on file    Review of Systems: Review of Systems  Constitutional: Positive for malaise/fatigue. Negative for chills and fever.  HENT: Negative for hearing loss and tinnitus.   Eyes: Negative for pain and redness.  Respiratory: Positive for shortness of breath. Negative for cough.   Cardiovascular: Negative for chest pain and palpitations.  Gastrointestinal: Negative for abdominal pain, blood in stool, constipation, diarrhea, heartburn, melena, nausea and vomiting.  Genitourinary: Negative for flank pain and hematuria.  Musculoskeletal: Negative for falls and joint pain.  Skin: Negative for itching and rash.  Neurological: Negative for seizures and loss of consciousness.  Endo/Heme/Allergies: Negative for polydipsia. Does not bruise/bleed easily.  Psychiatric/Behavioral: Negative for memory loss. The patient is not nervous/anxious.      Physical Exam: Vital signs: Vitals:   10/09/20 0930 10/09/20 1000  BP: 130/74 138/73  Pulse: 68 71  Resp: 14 19  Temp:    SpO2: 95% 99%     Physical Exam Vitals reviewed.  Constitutional:      General: He is not in acute distress. HENT:     Head: Normocephalic and atraumatic.     Nose: Nose normal. No congestion.     Mouth/Throat:     Mouth: Mucous membranes are moist.     Pharynx: Oropharynx is clear.  Eyes:     General: No scleral icterus.    Extraocular Movements: Extraocular movements intact.     Comments: Conjunctival pallor  Cardiovascular:  Rate and Rhythm: Normal rate. Rhythm irregular.     Pulses:  Normal pulses.  Pulmonary:     Effort: Pulmonary effort is normal. No respiratory distress.     Breath sounds: Normal breath sounds.  Abdominal:     General: Bowel sounds are normal. There is distension.     Palpations: Abdomen is soft. There is no mass.     Tenderness: There is no abdominal tenderness. There is no guarding or rebound.     Hernia: No hernia is present.  Musculoskeletal:        General: No tenderness.     Cervical back: Normal range of motion and neck supple.     Right lower leg: Edema (3+) present.     Left lower leg: Edema (3+) present.  Skin:    General: Skin is warm and dry.     Coloration: Skin is not jaundiced.  Neurological:     General: No focal deficit present.     Mental Status: He is lethargic.     Comments: Oriented to location and situation but not time  Psychiatric:        Mood and Affect: Mood normal.        Behavior: Behavior normal. Behavior is cooperative.      GI:  Lab Results: Recent Labs    10/08/20 1626 10/09/20 0417  WBC 4.0 3.4*  HGB 4.9* 6.2*  HCT 18.7* 21.2*  PLT 157 128*   BMET Recent Labs    10/08/20 1626 10/09/20 0417  NA 136 136  K 4.3 3.9  CL 103 105  CO2 21* 23  GLUCOSE 108* 99  BUN 46* 41*  CREATININE 1.39* 1.41*  CALCIUM 8.5* 8.5*   LFT Recent Labs    10/09/20 0417  PROT 6.4*  ALBUMIN 3.4*  AST 21  ALT 17  ALKPHOS 120  BILITOT 1.9*   PT/INR No results for input(s): LABPROT, INR in the last 72 hours.   Studies/Results: DG Chest 2 View  Result Date: 10/08/2020 CLINICAL DATA:  Shortness of breath. EXAM: CHEST - 2 VIEW COMPARISON:  August 16, 2020. FINDINGS: Stable cardiomegaly. No pneumothorax or pleural effusion is noted. Status post coronary bypass graft. Mild bilateral perihilar and basilar interstitial densities are noted which may represent scarring, but acute superimposed edema cannot be excluded. Bony thorax is unremarkable. IMPRESSION: Mild bilateral perihilar and basilar interstitial  densities are noted which may represent scarring, but acute superimposed edema cannot be excluded. Electronically Signed   By: Marijo Conception M.D.   On: 10/08/2020 16:39    Impression/Plan: Suspected Upper GI bleeding: history of portal hypertensive gastropathy, Grade 1 esophageal varices, and isolated gastric varix per EGD 07/2020.  History of gastric AVMs per EGD in 2019, as well as possible AVMs seen in 08/2019. -Hgb 4.9 on arrival; 3rd unit of pRBCs transfusing with repeat Hgb pending -INR ordered, pending  Hepatocellular carcinoma s/p radiation.  During prior hospitalization, he did not have persistent bleeding and was found to have [place], so BRTO was deferred.    Hepatic cirrhosis, presumably alcohol-related -T. Bili 1.9/ AST 21/ ALT 17/ ALP 120  -INR pending -Platelets128 K/uL  Plan: EGD today. I thoroughly discussed the procedure with the patient to include nature, alternatives, benefits, and risks (including but not limited to bleeding, infection, perforation, anesthesia/cardiac and pulmonary complications). Patient verbalized understanding gave verbal consent to proceed with EGD.  I also discussed the procedure with the patient's wife, as patient not oriented to time, to include nature,  alternatives, benefits, and risks (including but not limited to bleeding, infection, perforation, anesthesia/cardiac and pulmonary complications). Patient's wife verbalized understanding and gave verbal consent to proceed with EGD, witnessed by 2 RNs.    Continue Octreotide and Protonix infusions.  Continue to monitor CBC with transfusion as needed to maintain hemoglobin >7.  [facility] will follow.    LOS: 1 day   Salley Slaughter  PA-C 10/09/2020, 10:50 AM  Contact #  [phone]

## 2020-10-09 NOTE — Transfer of Care (Signed)
Immediate Anesthesia Transfer of Care Note  Patient: FILEMON BRETON  Procedure(s) Performed: ESOPHAGOGASTRODUODENOSCOPY (EGD) (N/A )  Patient Location: Endoscopy Unit  Anesthesia Type:MAC  Level of Consciousness: awake, alert , oriented and patient cooperative  Airway & Oxygen Therapy: Patient Spontanous Breathing and Patient connected to nasal cannula oxygen  Post-op Assessment: Report given to RN, Post -op Vital signs reviewed and stable and Patient moving all extremities  Post vital signs: Reviewed and stable  Last Vitals:  Vitals Value Taken Time  BP 141/46 10/09/20 1312  Temp    Pulse 65 10/09/20 1313  Resp 15 10/09/20 1313  SpO2 99 % 10/09/20 1313  Vitals shown include unvalidated device data.  Last Pain:  Vitals:   10/09/20 1312  TempSrc: Oral  PainSc:          Complications: No complications documented.

## 2020-10-09 NOTE — Plan of Care (Signed)

## 2020-10-09 NOTE — Anesthesia Preprocedure Evaluation (Addendum)
Anesthesia Evaluation  Patient identified by MRN, date of birth, ID band Patient awake    Reviewed: NPO status , Patient's Chart, lab work & pertinent test results  Airway Mallampati: I  TM Distance: >3 FB Neck ROM: Full    Dental  (+) Edentulous Upper, Edentulous Lower   Pulmonary asthma , sleep apnea and Continuous Positive Airway Pressure Ventilation , former smoker,    Pulmonary exam normal        Cardiovascular hypertension, Pt. on medications + CAD and +CHF   Rhythm:Regular Rate:Normal     Neuro/Psych negative neurological ROS  negative psych ROS   GI/Hepatic PUD, GERD  Medicated,(+) Cirrhosis     substance abuse  alcohol use, HCC   Endo/Other  diabetes, Type 2, Oral Hypoglycemic Agents  Renal/GU CRFRenal disease  negative genitourinary   Musculoskeletal  (+) Arthritis , Osteoarthritis,    Abdominal (+)  Abdomen: soft. Bowel sounds: normal.  Peds  Hematology  (+) Blood dyscrasia, anemia ,   Anesthesia Other Findings   Reproductive/Obstetrics                            Anesthesia Physical  Anesthesia Plan  ASA: III  Anesthesia Plan: MAC   Post-op Pain Management:    Induction: Intravenous  PONV Risk Score and Plan: 1 and Propofol infusion  Airway Management Planned: Simple Face Mask, Natural Airway and Nasal Cannula  Additional Equipment: None  Intra-op Plan:   Post-operative Plan:   Informed Consent: I have reviewed the patients History and Physical, chart, labs and discussed the procedure including the risks, benefits and alternatives for the proposed anesthesia with the patient or authorized representative who has indicated his/her understanding and acceptance.     Dental advisory given  Plan Discussed with: CRNA  Anesthesia Plan Comments: (ECHO 09/21: 1. Left ventricular ejection fraction, by estimation, is 60 to 65%. The  left ventricle has normal  function. The left ventricle has no regional  wall motion abnormalities. Left ventricular diastolic parameters are  consistent with Grade III diastolic  dysfunction (restrictive).  2. Right ventricular systolic function is normal. The right ventricular  size is normal. There is mildly elevated pulmonary artery systolic  pressure. The estimated right ventricular systolic pressure is 16.1 mmHg.  3. Left atrial size was mildly dilated.  4. Right atrial size was mild to moderately dilated.  5. The mitral valve is normal in structure. No evidence of mitral valve  regurgitation. The mean mitral valve gradient is 3.0 mmHg with average  heart rate of 69 bpm. Moderate mitral annular calcification.  6. The aortic valve is tricuspid. There is moderate calcification of the  aortic valve. Aortic valve regurgitation is trivial. Mild aortic valve  stenosis. Aortic valve mean gradient measures 10.0 mmHg.  7. Aortic dilatation noted. There is mild dilatation of the ascending  aorta, measuring 42 mm.  8. The inferior vena cava is dilated in size with >50% respiratory  variability, suggesting right atrial pressure of 8 mmHg.  Lab Results      Component                Value               Date                      WBC  4.5                 09/11/2020                HGB                      7.5 (L)             09/11/2020                HCT                      25.9 (L)            09/11/2020                MCV                      78.1 (L)            09/11/2020                PLT                      144 (L)             09/11/2020           Lab Results      Component                Value               Date                      NA                       138                 09/11/2020                K                        3.8                 09/11/2020                CO2                      24                  09/11/2020                GLUCOSE                  126 (H)              09/11/2020                BUN                      26 (H)              09/11/2020                CREATININE               1.13                09/11/2020  CALCIUM                  8.5 (L)             09/11/2020                GFRNONAA                 >60                 09/11/2020                GFRAA                    59 (L)              08/25/2020          )        Anesthesia Quick Evaluation

## 2020-10-09 NOTE — ED Notes (Addendum)
Received call from blood bank r/t transfuse RBC order. Pt is currently receiving a unit of blood, no repeat labs drawn. This nurse notified hospitalist [facility] MD of above. OK to hold 4th ordered unit at this time.Marland Kitchen

## 2020-10-09 NOTE — Brief Op Note (Signed)
10/08/2020 - 10/09/2020  1:15 PM  PATIENT:  [name]  75 y.o. male  PRE-OPERATIVE DIAGNOSIS:  anemia, suspected upper GI bleeding  POST-OPERATIVE DIAGNOSIS:  esophageal varices, gastric varices  PROCEDURE:  Procedure(s): ESOPHAGOGASTRODUODENOSCOPY (EGD) (N/A)  SURGEON:  Surgeon(s) and Role:    * Momo Braun, MD - Primary  Findings ----------- -EGD showed small esophageal varices and isolated type I gastric varices in the cardia.  No evidence of active bleeding.  Recommendations ---------------------- -Start full liquid diet -Continue octreotide, Protonix and antibiotics for today -Monitor H&H -Transfuse to keep hemoglobin around 7 -May need inpatient colonoscopy depending on clinical course -GI will follow  Otis Brace MD, [place] 10/09/2020, 1:16 PM  Contact #  [phone]

## 2020-10-09 NOTE — Progress Notes (Signed)
PROGRESS NOTE    [name]  WGN:[id number] DOB: April 29, 1946 DOA: 10/08/2020 PCP: [name], MD   Brief Narrative: HPI per Dr. Gala Romney on 10/08/20 [name] is a 75 y.o. male with medical history significant of liver cirrhosis with ascites, suspected hepatocellular carcinoma previously, diastolic dysfunction CHF, coronary artery disease with preserved ejection fraction, GERD, morbid obesity, history of GI bleed in the past who presents to the ER with progressive weakness lower extremity edema and exertional dyspnea.  Symptoms have been going off for a little while.  But got worse in the last week.  Patient is feeling heavy breathing.  He has also significant bilateral edema.  He recently noted melena on and off.  He has history of alcoholic cirrhosis but quit alcohol long time ago.  He has had chronic anemia.  He has had some mild cough but mainly the shortness of breath and the leg swelling.  He apparently had an EGD about 2 months ago that showed grade 1 esophageal varices with portal hypertensive gastropathy.  A month ago he was admitted with upper GI bleed and now back again with what appears to be upper GI bleed.  His hemoglobin was 5 to be 4.9.  He is being admitted with symptomatic anemia secondary to upper GI bleed..  ED Course: Temperature is 99 blood pressure 180/54 pulse 70 respirate 26 oxygen sat 93% on room air.  White count of 4.0 hemoglobin 4.9 and platelets 157.  Sodium 136 potassium 4.2 chloride 103 CO2 21 BUN 46 creatinine 1.39 calcium 8.5.  Fecal occult blood testing is positive x2.  COVID-19 testing is pending.  Patient initiated on octreotide and Protonix drip.  Also ordered 3 units of packed red blood cells and is being admitted to the hospital.  [facility] has been consulted  **Interim History  He was typed and screened and transfused 3 units of PRBCs.  He underwent EGD today.  He remains on a Protonix drip, octreotide drip, as well as on IV ceftriaxone.  GI  is consulted and following.  We will need to continue to monitor for signs and symptoms of volume overload given his grade 3 diastolic dysfunction.  Assessment & Plan:   Principal Problem:   Symptomatic anemia Active Problems:   GERD without esophagitis   Melena   Morbid obesity (HCC)   (HFpEF) heart failure with preserved ejection fraction (HCC)   Alcoholic cirrhosis of liver with ascites (HCC)   CKD (chronic kidney disease), stage II  Acute GI Bleed in the setting of Portal Gastropathy Symptomatic Anemia in the setting of Upper GI Bleed from Small Esophageal Varices and Isolated Type I Gastric Varices in the Cardia History of Gastric Varices -Patient was FOBT positive, with melanotic stools -Hemoglobin dropped to 4.9on presentation, baseline around 8; Typed and Screened and Transfuse 3 units of pRBC Hgb/Hct has been improving and is now 8.5/25.0 -GI consulted again; EGD on 08/10/2020 showed severe portal hypertensive gastropathy in the stomach with diffuse friable mucosa with contact bleeding,Grade 1 esophageal varices and type I isolated gastric varices was noted with no bleeding or stigmata of recent bleeding -Repeat EGD on 10/09/20 showed "EGD showed small esophageal varices and isolated type I gastric varices in the cardia.  No evidence of active bleeding. -GI recommended continuing the octreotide drip and Protonix as well as Abx -Full Liquid Diet today  -C/w IV Ceftriaxone for now -GI feels he may need IR evaluation for TIPS/BRTO if bleeding is from Gastric Varcies  -  Continue Monitor closely for signs and symptoms of bleeding  Acute on chronic iron deficiency blood loss anemia -Likely 2/2 above -Hemoglobin dropped to 5.5on presentation, baseline around 8; Now Hgb/Hct is 7.6/27.0 -S/p3units of PRBCon 10/08/20 -Transfuse to Keep Hgb around 7 -Continue to monitor for signs and symptoms of bleeding; currently no overt bleeding noted Repeat CBC in a.m.  Alcoholic liver  cirrhosis Hyperbilirubinemia -Noted to have an anasarca, oriented -LFTs WNL, T bili went from 1.1 -> 1.9 -INR was 1.2 -GI Following. Appreciate further Evaluation and recommendations   Thrombocytopenia -Patient's Platelet Count went from 157 -> 128 -> 141 -Continue to Monitor for S/Sx of Bleeding -Repeat CBC in AM   Chronic Grade 3 Diastolic HF -BNP was 161.0 -Chest x-ray showed "Mild bilateral perihilar and basilar interstitial densities are noted which may represent scarring, but acute superimposed edema cannot be excluded; repeat chest x-ray in a.m. -Troponin WNL at 111, EKG with no acute ST changes -Last echo done on 04/2020 showed EF of 60 to 65% with grade 3 diastolic dysfunction -Holding home Diuretics  -Discontinue LR 100 mL/hr given Hx of CHF and because of the Blood Transfusions he will be getting  -Strict I's and O's, daily weights; Patient is Now -1.439 liters; weight is 230 lbs -Continue to Monitor for S/Sx of Volume Overload and repeat CXR in the AM given the blood transfusions he has and because of the Fluid he is getting from the drips   History of Asthma -Currently not in exacerbation  -Continue with DuoNeb 3 mils twice daily, as well as albuterol neb 2.5 mg every 2 hours as needed for shortness of breath -C/w Supplemental oxygen via nasal cannula -SpO2: 97 % O2 Flow Rate (L/min): 2 L/min -Patient will need an ambulatory home O2 screen prior to discharge; he was off of supplemental oxygen via nasal cannula  CAD -Involving the native coronary artery of the heart without angina pectoris-he has severe left main disease on his cath in 2015 and underwent CABG x3 -Continue with nitroglycerin 0.4 mg sublingual every 5 minutes as needed chest pain, atorvastatin 40 once p.o. daily -Currently not on a beta-blocker due to his baseline bradycardia and not on aspirin due to chronic anemia and history of multiple GI bleeds -We will consult cardiology for further assistance but  is losartan has been held for now -Dr. Gardiner Rhyme recommends continuing the patient's statin as above  GERD -C/w IV PPI  CKD Stage IIIa -Creatinine stable at baseline but has been progressively worsening; Baseline Cr ranging from 1-1.3 -BUN/Cr now 39/1.40 and a little worse than baseline  -Hold Spironolactone, Torsemide -Avoid Nephrotoxic Medications, Contrast Dyes, Avoid Hypotension and Renally adjust Medications -Continue to Monitor and Trend -Repeat CMP  Pancytopenia -Patient's WBC was 3.8, hemoglobin/hematocrit was 8.5/25.0, and platelet count was 14115 -In the setting of his liver cirrhosis as above -Continue monitor and trend and continue to monitor for signs and symptoms of bleeding -Repeat CBC in a.m.  Diabetes Mellitus Type 2 -Last A1c 5.3% on 08/10/20 -Continue Moderate Novolog SSI AC/HS, Accu-Cheks, hypoglycemic protocol -Hold Home Regimen of Glimepiride 2 mg po Daily and Metformin 850 mg pO BID -CBGs have been ranging from 84-116  Hypertension -BP was soft  -His Losartan had been held as well as Lasix;  -Continue to Monitor BP per Protocol -Last BP was 147/60  Hepatocellular Carcinoma -Patient currently follows with IR; patient had chemoembolization in November -Continue outpatient follow-up  HLD -Resume Atorvastatin 40 mg po Daily   OSA -Noncompliant with  CPAP  Obesity -Complicates overall care and prognosis -Estimated body mass index is 34.97 kg/m as calculated from the following:   Height as of 09/11/20: 5\' 8"  (1.727 m).   Weight as of this encounter: 104.3 kg. -Weight Loss and Dietary Counseling given  Lifestyle modification advised  DVT prophylaxis: SCDs Code Status: FULL CODE Family Communication: No family present at bedside  Disposition Plan: Pending further clinical Improvement and clearance by Gastroenterology  Status is: Inpatient  Remains inpatient appropriate because:Unsafe d/c plan, IV treatments appropriate due to intensity of  illness or inability to take PO and Inpatient level of care appropriate due to severity of illness   Dispo: The patient is from: Home              Anticipated d/c is to: Home              Anticipated d/c date is: 2 days              Patient currently is not medically stable to d/c.   Difficult to place patient No  Consultants:   Gastroenterology   Procedures:  EGD Findings:      Grade I varices were found in the distal esophagus.      Type 1 isolated gastric varices (IGV1, varices located in the fundus)       with no bleeding were found in the cardia. There were no stigmata of       recent bleeding.      Mild portal hypertensive gastropathy was found in the gastric body.      The duodenal bulb, first portion of the duodenum and second portion of       the duodenum were normal. Impression:               - Grade I esophageal varices.                           - Type 1 isolated gastric varices (IGV1, varices                            located in the fundus), without bleeding.                           - Portal hypertensive gastropathy.                           - Normal duodenal bulb, first portion of the                            duodenum and second portion of the duodenum.                           - No specimens collected. Recommendation:           - Return patient to hospital ward for ongoing care.                           - Full liquid diet.                           - Continue present medications.  Antimicrobials:  Anti-infectives (From admission, onward)   None  Subjective: Seen and examined at bedside in the ED and he was feeling a little uncomfortable and probably short of breath.  Denied any more abdominal pain currently.  States he is feeling a little bit better with the blood.  No nausea or vomiting.  No other concerns or complaints at this time.   Objective: Vitals:   10/09/20 0739 10/09/20 0800 10/09/20 0810 10/09/20 0830  BP: (!) 153/63 (!) 151/52  (!) 148/67 (!) 148/77  Pulse: 63 74 74 70  Resp: 17 17 17 18   Temp:   98.1 F (36.7 C) 98.1 F (36.7 C)  TempSrc:    Oral  SpO2: 98% 98% 96% 96%    Intake/Output Summary (Last 24 hours) at 10/09/2020 0844 Last data filed at 10/09/2020 0600 Gross per 24 hour  Intake 0.63 ml  Output 1200 ml  Net -1199.37 ml   There were no vitals filed for this visit.  Examination: Physical Exam:  Constitutional: WN/WD obese chronically ill-appearing Caucasian male currently in mild distress appears slightly uncomfortable.  Eyes: Lids and conjunctivae normal, sclerae anicteric  ENMT: External Ears, Nose appear normal. Grossly normal hearing.  Neck: Appears normal, supple, no cervical masses, normal ROM, no appreciable thyromegaly; no JVD Respiratory: Diminished to auscultation bilaterally, no wheezing, rales, rhonchi or crackles. Normal respiratory effort and patient is not tachypenic. No accessory muscle use.  Unlabored breathing Cardiovascular: RRR, no murmurs / rubs / gallops. S1 and S2 auscultated. Has 1-2+ LE Edema Abdomen: Soft, non-tender, Distended 2/2 body habitus. Bowel sounds positive x4.  GU: Deferred. Musculoskeletal: No clubbing / cyanosis of digits/nails. No joint deformity upper and lower extremities.  Skin: No rashes, lesions, ulcers but has chronic LE Venous stasis changes. No induration; Warm and dry.  Neurologic: CN 2-12 grossly intact with no focal deficits. Romberg sign and cerebellar reflexes not assessed.  Psychiatric: Normal judgment and insight. Alert and oriented x 3. Normal mood and appropriate affect.   Data Reviewed: I have personally reviewed following labs and imaging studies  CBC: Recent Labs  Lab 10/08/20 1626 10/09/20 0417  WBC 4.0 3.4*  HGB 4.9* 6.2*  HCT 18.7* 21.2*  MCV 75.4* 76.0*  PLT 157 458*   Basic Metabolic Panel: Recent Labs  Lab 10/08/20 1626 10/09/20 0417  NA 136 136  K 4.3 3.9  CL 103 105  CO2 21* 23  GLUCOSE 108* 99  BUN 46* 41*   CREATININE 1.39* 1.41*  CALCIUM 8.5* 8.5*   GFR: CrCl cannot be calculated (Unknown ideal weight.). Liver Function Tests: Recent Labs  Lab 10/08/20 1626 10/09/20 0417  AST 27 21  ALT 18 17  ALKPHOS 142* 120  BILITOT 1.1 1.9*  PROT 7.2 6.4*  ALBUMIN 3.7 3.4*   No results for input(s): LIPASE, AMYLASE in the last 168 hours. No results for input(s): AMMONIA in the last 168 hours. Coagulation Profile: No results for input(s): INR, PROTIME in the last 168 hours. Cardiac Enzymes: No results for input(s): CKTOTAL, CKMB, CKMBINDEX, TROPONINI in the last 168 hours. BNP (last 3 results) No results for input(s): PROBNP in the last 8760 hours. HbA1C: No results for input(s): HGBA1C in the last 72 hours. CBG: Recent Labs  Lab 10/09/20 0046 10/09/20 0740  GLUCAP 116* 84   Lipid Profile: No results for input(s): CHOL, HDL, LDLCALC, TRIG, CHOLHDL, LDLDIRECT in the last 72 hours. Thyroid Function Tests: No results for input(s): TSH, T4TOTAL, FREET4, T3FREE, THYROIDAB in the last 72 hours. Anemia Panel: No results for input(s): VITAMINB12, FOLATE, FERRITIN, TIBC,  IRON, RETICCTPCT in the last 72 hours. Sepsis Labs: No results for input(s): PROCALCITON, LATICACIDVEN in the last 168 hours.  Recent Results (from the past 240 hour(s))  SARS CORONAVIRUS 2 (TAT 6-24 HRS) Nasopharyngeal Nasopharyngeal Swab     Status: None   Collection Time: 10/08/20  6:09 PM   Specimen: Nasopharyngeal Swab  Result Value Ref Range Status   SARS Coronavirus 2 NEGATIVE NEGATIVE Final    Comment: (NOTE) SARS-CoV-2 target nucleic acids are NOT DETECTED.  The SARS-CoV-2 RNA is generally detectable in upper and lower respiratory specimens during the acute phase of infection. Negative results do not preclude SARS-CoV-2 infection, do not rule out co-infections with other pathogens, and should not be used as the sole basis for treatment or other patient management decisions. Negative results must be combined  with clinical observations, patient history, and epidemiological information. The expected result is Negative.  Fact Sheet for Patients: SugarRoll.be  Fact Sheet for Healthcare Providers: https://www.woods-mathews.com/  This test is not yet approved or cleared by the [place] FDA and  has been authorized for detection and/or diagnosis of SARS-CoV-2 by FDA under an Emergency Use Authorization (EUA). This EUA will remain  in effect (meaning this test can be used) for the duration of the COVID-19 declaration under Se ction 564(b)(1) of the Act, 21 U.S.C. section 360bbb-3(b)(1), unless the authorization is terminated or revoked sooner.  Performed at [facility], [place] [address] [place], [place] [address]      RN Pressure Injury Documentation:     Estimated body mass index is 35.43 kg/m as calculated from the following:   Height as of 09/11/20: 5\' 8"  (1.727 m).   Weight as of 09/11/20: 105.7 kg.  Malnutrition Type:   Malnutrition Characteristics:   Nutrition Interventions:     Radiology Studies: DG Chest 2 View  Result Date: 10/08/2020 CLINICAL DATA:  Shortness of breath. EXAM: CHEST - 2 VIEW COMPARISON:  August 16, 2020. FINDINGS: Stable cardiomegaly. No pneumothorax or pleural effusion is noted. Status post coronary bypass graft. Mild bilateral perihilar and basilar interstitial densities are noted which may represent scarring, but acute superimposed edema cannot be excluded. Bony thorax is unremarkable. IMPRESSION: Mild bilateral perihilar and basilar interstitial densities are noted which may represent scarring, but acute superimposed edema cannot be excluded. Electronically Signed   By: Marijo Conception M.D.   On: 10/08/2020 16:39   Scheduled Meds: . insulin aspart  0-15 Units Subcutaneous TID WC  . insulin aspart  0-5 Units Subcutaneous QHS  . [START ON 10/12/2020] pantoprazole  40 mg Intravenous Q12H   Continuous  Infusions: . lactated ringers    . octreotide  (SANDOSTATIN)    IV infusion 50 mcg/hr (10/08/20 2059)  . pantoprozole (PROTONIX) infusion Stopped (10/09/20 0511)    LOS: 1 day   Kerney Elbe, DO [facility] PAGER is on [place]  If 7PM-7AM, please contact night-coverage [email or web address]

## 2020-10-10 DIAGNOSIS — I8510 Secondary esophageal varices without bleeding: Secondary | ICD-10-CM | POA: Diagnosis not present

## 2020-10-10 DIAGNOSIS — I864 Gastric varices: Secondary | ICD-10-CM | POA: Diagnosis not present

## 2020-10-10 DIAGNOSIS — K766 Portal hypertension: Secondary | ICD-10-CM | POA: Diagnosis not present

## 2020-10-10 DIAGNOSIS — K7031 Alcoholic cirrhosis of liver with ascites: Secondary | ICD-10-CM | POA: Diagnosis not present

## 2020-10-10 DIAGNOSIS — E669 Obesity, unspecified: Secondary | ICD-10-CM

## 2020-10-10 DIAGNOSIS — D649 Anemia, unspecified: Secondary | ICD-10-CM | POA: Diagnosis not present

## 2020-10-10 DIAGNOSIS — N182 Chronic kidney disease, stage 2 (mild): Secondary | ICD-10-CM | POA: Diagnosis not present

## 2020-10-10 DIAGNOSIS — K7460 Unspecified cirrhosis of liver: Secondary | ICD-10-CM | POA: Diagnosis not present

## 2020-10-10 DIAGNOSIS — I5032 Chronic diastolic (congestive) heart failure: Secondary | ICD-10-CM | POA: Diagnosis not present

## 2020-10-10 LAB — CBC WITH DIFFERENTIAL/PLATELET
Abs Immature Granulocytes: 0.01 10*3/uL (ref 0.00–0.07)
Basophils Absolute: 0 10*3/uL (ref 0.0–0.1)
Basophils Relative: 1 %
Eosinophils Absolute: 0.2 10*3/uL (ref 0.0–0.5)
Eosinophils Relative: 4 %
HCT: 23.6 % — ABNORMAL LOW (ref 39.0–52.0)
Hemoglobin: 7.2 g/dL — ABNORMAL LOW (ref 13.0–17.0)
Immature Granulocytes: 0 %
Lymphocytes Relative: 9 %
Lymphs Abs: 0.4 10*3/uL — ABNORMAL LOW (ref 0.7–4.0)
MCH: 23.2 pg — ABNORMAL LOW (ref 26.0–34.0)
MCHC: 30.5 g/dL (ref 30.0–36.0)
MCV: 76.1 fL — ABNORMAL LOW (ref 80.0–100.0)
Monocytes Absolute: 0.3 10*3/uL (ref 0.1–1.0)
Monocytes Relative: 7 %
Neutro Abs: 3.5 10*3/uL (ref 1.7–7.7)
Neutrophils Relative %: 79 %
Platelets: 135 10*3/uL — ABNORMAL LOW (ref 150–400)
RBC: 3.1 MIL/uL — ABNORMAL LOW (ref 4.22–5.81)
RDW: 20.4 % — ABNORMAL HIGH (ref 11.5–15.5)
WBC: 4.4 10*3/uL (ref 4.0–10.5)
nRBC: 0 % (ref 0.0–0.2)

## 2020-10-10 LAB — COMPREHENSIVE METABOLIC PANEL
ALT: 18 U/L (ref 0–44)
AST: 24 U/L (ref 15–41)
Albumin: 3.5 g/dL (ref 3.5–5.0)
Alkaline Phosphatase: 124 U/L (ref 38–126)
Anion gap: 8 (ref 5–15)
BUN: 32 mg/dL — ABNORMAL HIGH (ref 8–23)
CO2: 23 mmol/L (ref 22–32)
Calcium: 8.5 mg/dL — ABNORMAL LOW (ref 8.9–10.3)
Chloride: 105 mmol/L (ref 98–111)
Creatinine, Ser: 1.31 mg/dL — ABNORMAL HIGH (ref 0.61–1.24)
GFR, Estimated: 57 mL/min — ABNORMAL LOW (ref >60)
Glucose, Bld: 95 mg/dL (ref 70–99)
Potassium: 4.2 mmol/L (ref 3.5–5.1)
Sodium: 136 mmol/L (ref 135–145)
Total Bilirubin: 2 mg/dL — ABNORMAL HIGH (ref 0.3–1.2)
Total Protein: 6.8 g/dL (ref 6.5–8.1)

## 2020-10-10 LAB — MAGNESIUM: Magnesium: 2.5 mg/dL — ABNORMAL HIGH (ref 1.7–2.4)

## 2020-10-10 LAB — GLUCOSE, CAPILLARY
Glucose-Capillary: 96 mg/dL (ref 70–99)
Glucose-Capillary: 157 mg/dL — ABNORMAL HIGH (ref 70–99)
Glucose-Capillary: 119 mg/dL — ABNORMAL HIGH (ref 70–99)

## 2020-10-10 LAB — PHOSPHORUS: Phosphorus: 3.5 mg/dL (ref 2.5–4.6)

## 2020-10-10 LAB — PREPARE RBC (CROSSMATCH)

## 2020-10-10 LAB — BRAIN NATRIURETIC PEPTIDE: B Natriuretic Peptide: 432.1 pg/mL — ABNORMAL HIGH (ref 0.0–100.0)

## 2020-10-10 MED ORDER — SODIUM CHLORIDE 0.9% IV SOLUTION: Freq: Once | INTRAVENOUS | Status: DC

## 2020-10-10 MED ORDER — TORSEMIDE 20 MG PO TABS: 40.0000 mg | ORAL_TABLET | Freq: Two times a day (BID) | ORAL | Status: DC

## 2020-10-10 MED ORDER — PANTOPRAZOLE SODIUM 40 MG PO TBEC: 40.0000 mg | DELAYED_RELEASE_TABLET | Freq: Two times a day (BID) | ORAL | Status: DC

## 2020-10-10 MED ORDER — FUROSEMIDE 10 MG/ML IJ SOLN
20.0000 mg | Freq: Once | INTRAMUSCULAR | Status: AC
  Administered 2020-10-10: 20 mg via INTRAVENOUS
  Filled 2020-10-10: qty 2

## 2020-10-10 MED ORDER — SPIRONOLACTONE 25 MG PO TABS
25.0000 mg | ORAL_TABLET | Freq: Every day | ORAL | Status: DC
  Administered 2020-10-10: 25 mg via ORAL
  Filled 2020-10-10: qty 1

## 2020-10-10 NOTE — Discharge Summary (Signed)
Physician Discharge Summary  [name] JEH:[id number] DOB: Apr 15, 1946 DOA: 10/08/2020  PCP: [name], MD  Admit date: 10/08/2020 Discharge date: 10/10/2020  Admitted From: Home Disposition: Home with Home Health  Recommendations for Outpatient Follow-up:  1. Follow up with PCP in 1-2 weeks 2. Follow up with Gastroenterology within 1-2 weeks; Have colonoscopy done in the outpatient setting  3. Follow up with Cardiology within 1-2 weeks 4. Please obtain CMP/CBC, Mag, Phos in one week 5. Please follow up on the following pending results:  Home Health: No Equipment/Devices: None    Discharge Condition: Stable CODE STATUS: FULL CODE Diet recommendation: Heart Healthy Carb Modified diet with 1500 mL Fluid Restriction  Brief/Interim Summary: HPI per Dr. Gala Romney on 10/08/20 John Seller Sharpis a 74 y.o.malewith medical history significant ofliver cirrhosis with ascites, suspected hepatocellular carcinoma previously, diastolic dysfunction CHF, coronary artery disease with preserved ejection fraction, GERD, morbid obesity, history of GI bleed in the past who presents to the ER with progressive weakness lower extremity edema and exertional dyspnea. Symptoms have been going off for a little while. But got worse in the last week. Patient is feeling heavy breathing. He has also significant bilateral edema. He recently noted melena on and off. He has history of alcoholic cirrhosis but quit alcohol long time ago. He has had chronic anemia. He has had some mild cough but mainly the shortness of breath and the leg swelling. He apparently had an EGD about 2 months ago that showed grade 1 esophageal varices with portal hypertensive gastropathy. Amonth ago he was admitted with upper GI bleed and now back again with what appears to be upper GI bleed. His hemoglobin was 5 to be 4.9. He is being admitted with symptomatic anemia secondary to upper GI bleed..  ED  Course:Temperature is 99 blood pressure 180/54 pulse 70 respirate 26 oxygen sat 93% on room air. White count of 4.0 hemoglobin 4.9 and platelets 157. Sodium 136 potassium 4.2 chloride 103 CO2 21 BUN 46 creatinine 1.39 calcium 8.5. Fecal occult blood testing is positive x2. COVID-19 testing is pending. Patient initiated on octreotide and Protonix drip. Also ordered 3 units of packed red blood cells and is being admitted to the hospital. [name] has been consulted  **Interim History  He was typed and screened and transfused 3 units of PRBCs.  He underwent EGD today.  He remains on a Protonix drip, octreotide drip, as well as on IV ceftriaxone.  GI is consulted and following.  We will need to continue to monitor for signs and symptoms of volume overload given his grade 3 diastolic dysfunction.  Today 10/10/20 his Blood count dropped again slighty  so we will type and screen and transfuse 1 unit of PRBCs.  GI evaluated and recommending colonoscopy with patient has been refusing.  GI recommends discharging the patient because there is nothing else to offer him currently and recommend changing him to p.o. Protonix and stopping octreotide.  After my discussion with Dr. Earvin Hansen he also recommends stopping the ceftriaxone.  We will advance the patient's diet and resume his diuretics and give him a dose of IV Lasix today given the blood unit that he is will get prior to discharge.  Currently he is stable to be discharged but has a very high risk for readmission and this was discussed with the patient and he understands agrees and still wants to go home and not pursue further work-up outpatient.  GI recommended patient be discharged and follow-up closely within  1 week and repeat blood work.  Also need to follow-up with his cardiologist at discharge.  Discharge Diagnoses:  Principal Problem:   Symptomatic anemia Active Problems:   GERD without esophagitis   Melena   Morbid obesity (HCC)   (HFpEF) heart  failure with preserved ejection fraction (HCC)   Alcoholic cirrhosis of liver with ascites (HCC)   CKD (chronic kidney disease), stage II  Acute GI Bleed in the setting of Portal Gastropathy Symptomatic Anemia in the setting of Upper GI Bleed from Small Esophageal Varices and Isolated Type I Gastric Varices in the Cardia History of Gastric Varices -Patient was FOBT positive, with melanotic stools -Hemoglobin dropped to 4.9on presentation, baseline around 8; Typed and Screened and Transfuse 3 units of pRBC Hgb/Hcthas been improving and is now 8.5/25.0 yesterday and is now slowly trending down again and is 7.2/23.6 and we will type and screen and transfuse 1 unit PRBCs -GI consulted again; EGD on 08/10/2020 showed severe portal hypertensive gastropathy in the stomach with diffuse friable mucosa with contact bleeding,Grade 1 esophageal varices and type I isolated gastric varices was noted with no bleeding or stigmata of recent bleeding -Repeat EGD on 10/09/20 showed "EGD showed small esophageal varices and isolated type I gastric varices in the cardia. No evidence of active bleeding. -GI recommended discontinuing the octreotide drip and changing to p.o. Protonix and advancing diet as patient does not want to pursue inpatient colonoscopy -Full Liquid Diet today advance to a heart healthy carb modified diet -IV ceftriaxone discontinued per GI recommendations after my discussion with Dr. [name] -GI feels he may need IR evaluation for TIPS/BRTO if bleeding is from Gastric Varcies  -Continue Monitor closely for signs and symptoms of bleeding and had a bowel movement will normal blood in it. -GI recommended a colonoscopy but patient refused so they recommend discharging the patient and following up closely as an outpatient -PT OT recommending home health at discharge and he is stable to be discharged at this time given that he is adamant that he wants to leave does not want to pursue any further work-up  inpatient  Acute on chronic iron deficiency blood loss anemia -Likely 2/2 above -Hemoglobin dropped to 4.9on presentation, baseline around 8; Now Hgb/Hctistrending back down and is now 7.2/23.6 and we will type and screen and transfuse 1 unit PRBCs prior to discharge -S/p3units of PRBCon 10/08/20 -Transfuse to Keep Hgb around 7 -Continue to monitor for signs and symptoms of bleeding; currently no overt bleeding noted Repeat CBC in a.m.  Alcoholic liver cirrhosis Hyperbilirubinemia -Noted to have an anasarca, oriented -LFTs WNL, T bili went from 1.1 -> 1.9 and today was 2.0 -INR was 1.2 -GI Following. Appreciate further Evaluation and recommendations and they have signed off the case now  Thrombocytopenia -Patient's Platelet Count went from 157 -> 128 -> 141 and trended down to 139 and now is 135 today and stable -Continue to Monitor for S/Sx of Bleeding -Repeat CBC within 1 week  ChronicGrade 3Diastolic HF -BNP was 540.0 and worsened at 432.1 -Chest x-ray showed "Mild bilateral perihilar and basilar interstitial densities are noted which may represent scarring, but acute superimposed edema cannot be excluded; repeat chest x-ray in a.m. -Troponin WNL at 111, EKG with no acute ST changes -Last echo done on 04/2020 showed EF of 60 to 65% with grade 3 diastolic dysfunction -Resumed home Diuretics of Spironolactone 25 mg po daily and Torsemide 40 mg po BID -Discontinue LR 100 mL/hr given Hx of CHF and because of  the Blood Transfusions he will be getting  -Strict I's and O's, daily weights; Patient is Now - 764.4 ; weight is 230 lbs -Continue to Monitor for S/Sx of Volume Overload and repeat CXR in the AMgiven the blood transfusions he has and because of the Fluid he is getting from the drips. -Given a dose of IV Lasix given his blood transfusions -Follow-up with cardiology in outpatient setting and continue to weigh yourself avoid salt and restrict fluid intake to 1500  mL  History of Asthma -Currently not in exacerbation  -Continue with DuoNeb 3 mils twice daily, as well as albuterol neb 2.5 mg every 2 hours as needed for shortness of breath -C/w Supplemental oxygen via nasal cannula -SpO2: 97 % O2 Flow Rate (L/min): 2 L/min; he was off of supplemental oxygen via nasal cannula -Patient will need an ambulatory home O2 screen prior to discharge; he was off of supplemental oxygen via nasal cannula again today and did well with therapy and recommending home health  CAD -Involving the native coronary artery of the heart without angina pectoris-he has severe left main disease on his cath in 2015 and underwent CABG x3 -Continue with nitroglycerin 0.4 mg sublingual every 5 minutes as needed chest pain, atorvastatin 40 once p.o. daily -Currently not on a beta-blocker due to his baseline bradycardia and not on aspirin due to chronic anemia and history of multiple GI bleeds   GERD -IV PPI changed to PPI twice daily per GI recommendations  CKD Stage IIIa, improving  -Creatinine stable at baseline but has been progressively worsening; Baseline Cr ranging from 1-1.3 -BUN/Cr now41/1.41 on admission and a little worse than baseline and is improving now and is 32/1.31 today -Hold Spironolactone, Torsemide -Avoid Nephrotoxic Medications, Contrast Dyes, Avoid Hypotension and Renally adjust Medications -Continue to Monitor and Trend -Repeat CMP within 1 week  Pancytopenia -Patient's WBC was 3.8, hemoglobin/hematocrit was 8.5/25.0, and platelet count was 141 -Now WBC is improved and WBC is 4.4, Hgb/Hct is 7.2/23.6 and Platelet Count is 135 -In the setting of his liver cirrhosis as above -Continue monitor and trend and continue to monitor for signs and symptoms of bleeding -Repeat CBC within 1 week  Diabetes Mellitus Type 2 -Last A1c 5.3% on 08/10/20 -Continue Moderate Novolog SSI AC/HS, Accu-Cheks, hypoglycemic protocol -Hold Home Regimen of Glimepiride 2  mg po Daily and Metformin 850 mg pO BID while he is hospitalized and resume at discharge -CBGs have been ranging from 85-157  Hypertension -BP is acceptable  -MAR no longer reflects that he is taking Losartan and Lasix; he is now on Torsemide 40 mg po BID which has been held -Continue to Monitor BP per Protocol -Last BP was 128/64  Hepatocellular Carcinoma -Patient currently follows with IR; patient had chemoembolization in November -Continue outpatient follow-up  HLD -Resume Atorvastatin 40 mg po Daily   OSA -Noncompliant with CPAP  Obesity -Complicates overall care and prognosis -Estimated body mass index is 37.48 kg/m as calculated from the following:   Height as of this encounter: '5\' 8"'  (1.727 m).   Weight as of this encounter: 111.8 kg. -Weight Loss and Dietary Counseling given  Lifestyle modification advised  Discharge Instructions  Discharge Instructions    Call MD for:  difficulty breathing, headache or visual disturbances   Complete by: As directed    Call MD for:  extreme fatigue   Complete by: As directed    Call MD for:  hives   Complete by: As directed    Call MD  for:  persistant dizziness or light-headedness   Complete by: As directed    Call MD for:  persistant nausea and vomiting   Complete by: As directed    Call MD for:  redness, tenderness, or signs of infection (pain, swelling, redness, odor or green/yellow discharge around incision site)   Complete by: As directed    Call MD for:  severe uncontrolled pain   Complete by: As directed    Call MD for:  temperature >100.4   Complete by: As directed    Diet - low sodium heart healthy   Complete by: As directed    Diet Carb Modified   Complete by: As directed    Discharge instructions   Complete by: As directed    You were cared for by a hospitalist during your hospital stay. If you have any questions about your discharge medications or the care you received while you were in the hospital after  you are discharged, you can call the unit and ask to speak with the hospitalist on call if the hospitalist that took care of you is not available. Once you are discharged, your primary care physician will handle any further medical issues. Please note that NO REFILLS for any discharge medications will be authorized once you are discharged, as it is imperative that you return to your primary care physician (or establish a relationship with a primary care physician if you do not have one) for your aftercare needs so that they can reassess your need for medications and monitor your lab values.  Follow up with PCP, Cardiology and Gastroenterology. Take all medications as prescribed. If symptoms change or worsen please return to the ED for evaluation   Increase activity slowly   Complete by: As directed      Allergies as of 10/10/2020   No Known Allergies     Medication List    STOP taking these medications   ferrous sulfate 325 (65 FE) MG EC tablet     TAKE these medications   Accu-Chek Softclix Lancets lancets USE AS DIRECTED TO TEST BLOOD SUGAR FOUR TIMES DAILY   albuterol 108 (90 Base) MCG/ACT inhaler Commonly known as: VENTOLIN HFA Inhale 2 puffs into the lungs every 6 (six) hours as needed for wheezing or shortness of breath.   Anoro Ellipta 62.5-25 MCG/INH Aepb Generic drug: umeclidinium-vilanterol Inhale 1 puff into the lungs daily.   atorvastatin 40 MG tablet Commonly known as: LIPITOR TAKE 1 TABLET BY MOUTH DAILY AT 6 PM What changed: See the new instructions.   blood glucose meter kit and supplies Dispense based on patient and insurance preference. Use up to four times daily as directed. (FOR ICD-10 E10.9, E11.9).   gabapentin 100 MG capsule Commonly known as: NEURONTIN TAKE 1 CAPSULE(100 MG) BY MOUTH TWICE DAILY What changed: See the new instructions.   glimepiride 2 MG tablet Commonly known as: AMARYL Take 2 mg by mouth daily.   METAMUCIL PO Take 15 mLs by mouth  3 (three) times daily with meals.   metFORMIN 850 MG tablet Commonly known as: GLUCOPHAGE TAKE 1 TABLET(850 MG) BY MOUTH TWICE DAILY WITH A MEAL What changed: See the new instructions.   nitroGLYCERIN 0.4 MG SL tablet Commonly known as: NITROSTAT Place 1 tablet (0.4 mg total) under the tongue every 5 (five) minutes as needed for chest pain.   pantoprazole 40 MG tablet Commonly known as: PROTONIX Take 1 tablet (40 mg total) by mouth 2 (two) times daily.   potassium chloride  SA 20 MEQ tablet Commonly known as: KLOR-CON Take 1 tablet (20 mEq total) by mouth daily.   spironolactone 25 MG tablet Commonly known as: ALDACTONE Take 1 tablet (25 mg total) by mouth daily.   torsemide 20 MG tablet Commonly known as: DEMADEX Take 2 tablets (40 mg total) by mouth 2 (two) times daily.       Follow-up Information    [facility] Follow up.   Specialty: Home Health Services Why: for home health services PT/OT Contact information: [place] [address] [phone]              No Known Allergies  Consultations:  Gastroenterology  Procedures/Studies: DG Chest 2 View  Result Date: 10/08/2020 CLINICAL DATA:  Shortness of breath. EXAM: CHEST - 2 VIEW COMPARISON:  August 16, 2020. FINDINGS: Stable cardiomegaly. No pneumothorax or pleural effusion is noted. Status post coronary bypass graft. Mild bilateral perihilar and basilar interstitial densities are noted which may represent scarring, but acute superimposed edema cannot be excluded. Bony thorax is unremarkable. IMPRESSION: Mild bilateral perihilar and basilar interstitial densities are noted which may represent scarring, but acute superimposed edema cannot be excluded. Electronically Signed   By: Marijo Conception M.D.   On: 10/08/2020 16:39   DG CHEST PORT 1 VIEW  Result Date: 10/09/2020 CLINICAL DATA:  Shortness of breath. EXAM: PORTABLE CHEST 1 VIEW COMPARISON:  Radiograph yesterday. FINDINGS: Post  median sternotomy and CABG. Cardiomegaly is stable. Aortic atherosclerosis. Worsening bilateral interstitial lung opacities from prior exam. Bilateral pleural effusions, right greater than left. No pneumothorax. The patient is rotated. No acute osseous abnormalities are seen. IMPRESSION: 1. Worsening bilateral interstitial lung opacities since yesterday, suspicious for pulmonary edema. 2. Bilateral pleural effusions, right greater than left, increased. 3. Stable cardiomegaly. Aortic Atherosclerosis (ICD10-I70.0). Electronically Signed   By: Keith Rake M.D.   On: 10/09/2020 19:07    Subjective: Seen and examined at bedside and he states he did not have a good night and had disturbed sleep. No nausea or vomiting. No CP or SOB. Hoping to go home and was adamant and after discussion with gastroenterology we will discharge him home given that he is refusing further inpatient work-up. States he had a good bowel movement and did not see any blood in it. No other concerns or complaints at this time.   Discharge Exam: Vitals:   10/10/20 1451 10/10/20 1741  BP: (!) 121/58 128/64  Pulse: (!) 27 (!) 57  Resp: 18 16  Temp: 98 F (36.7 C) 97.7 F (36.5 C)  SpO2: 100% 97%   Vitals:   10/10/20 1407 10/10/20 1435 10/10/20 1451 10/10/20 1741  BP: (!) 148/66  (!) 121/58 128/64  Pulse: 70 68 (!) 27 (!) 57  Resp:   18 16  Temp: 97.9 F (36.6 C)  98 F (36.7 C) 97.7 F (36.5 C)  TempSrc: Oral  Oral Oral  SpO2: 97% 100% 100% 97%  Weight:      Height:       General: Pt is alert, awake, not in acute distress Cardiovascular: RRR, S1/S2 +, no rubs, no gallops Respiratory: Diminished bilaterally, no wheezing, no rhonchi Abdominal: Soft, NT, distended secondary body habitus, bowel sounds + Extremities: 1+ lower extremity edema with chronic lower extremity venous stasis changes, no cyanosis  The results of significant diagnostics from this hospitalization (including imaging, microbiology, ancillary and  laboratory) are listed below for reference.    Microbiology: Recent Results (from the past 240  hour(s))  SARS CORONAVIRUS 2 (TAT 6-24 HRS) Nasopharyngeal Nasopharyngeal Swab     Status: None   Collection Time: 10/08/20  6:09 PM   Specimen: Nasopharyngeal Swab  Result Value Ref Range Status   SARS Coronavirus 2 NEGATIVE NEGATIVE Final    Comment: (NOTE) SARS-CoV-2 target nucleic acids are NOT DETECTED.  The SARS-CoV-2 RNA is generally detectable in upper and lower respiratory specimens during the acute phase of infection. Negative results do not preclude SARS-CoV-2 infection, do not rule out co-infections with other pathogens, and should not be used as the sole basis for treatment or other patient management decisions. Negative results must be combined with clinical observations, patient history, and epidemiological information. The expected result is Negative.  Fact Sheet for Patients: SugarRoll.be  Fact Sheet for Healthcare Providers: https://www.woods-mathews.com/  This test is not yet approved or cleared by the [place] FDA and  has been authorized for detection and/or diagnosis of SARS-CoV-2 by FDA under an Emergency Use Authorization (EUA). This EUA will remain  in effect (meaning this test can be used) for the duration of the COVID-19 declaration under Se ction 564(b)(1) of the Act, 21 U.S.C. section 360bbb-3(b)(1), unless the authorization is terminated or revoked sooner.  Performed at [facility], [place] [address], [place], [place] [address]     Labs: BNP (last 3 results) Recent Labs    08/14/20 0353 10/08/20 1727 10/10/20 0333  BNP 414.2* 311.1* 003.4*   Basic Metabolic Panel: Recent Labs  Lab 10/08/20 1626 10/09/20 0417 10/09/20 1228 10/10/20 0333  NA 136 136 140 136  K 4.3 3.9 4.2 4.2  CL 103 105 105 105  CO2 21* 23  --  23  GLUCOSE 108* 99 94 95  BUN 46* 41* 39* 32*  CREATININE 1.39* 1.41* 1.40*  1.31*  CALCIUM 8.5* 8.5*  --  8.5*  MG  --   --   --  2.5*  PHOS  --   --   --  3.5   Liver Function Tests: Recent Labs  Lab 10/08/20 1626 10/09/20 0417 10/10/20 0333  AST '27 21 24  ' ALT '18 17 18  ' ALKPHOS 142* 120 124  BILITOT 1.1 1.9* 2.0*  PROT 7.2 6.4* 6.8  ALBUMIN 3.7 3.4* 3.5   No results for input(s): LIPASE, AMYLASE in the last 168 hours. No results for input(s): AMMONIA in the last 168 hours. CBC: Recent Labs  Lab 10/08/20 1626 10/09/20 0417 10/09/20 1039 10/09/20 1228 10/09/20 1645 10/10/20 0333  WBC 4.0 3.4* 3.8*  --  4.3 4.4  NEUTROABS  --   --   --   --   --  3.5  HGB 4.9* 6.2* 7.3* 8.5* 7.5* 7.2*  HCT 18.7* 21.2* 26.0* 25.0* 27.2* 23.6*  MCV 75.4* 76.0* 78.5*  --  80.7 76.1*  PLT 157 128* 141*  --  139* 135*   Cardiac Enzymes: No results for input(s): CKTOTAL, CKMB, CKMBINDEX, TROPONINI in the last 168 hours. BNP: Invalid input(s): POCBNP CBG: Recent Labs  Lab 10/09/20 1625 10/09/20 2112 10/10/20 0757 10/10/20 1233 10/10/20 1711  GLUCAP 85 133* 96 157* 119*   D-Dimer No results for input(s): DDIMER in the last 72 hours. Hgb A1c No results for input(s): HGBA1C in the last 72 hours. Lipid Profile No results for input(s): CHOL, HDL, LDLCALC, TRIG, CHOLHDL, LDLDIRECT in the last 72 hours. Thyroid function studies No results for input(s): TSH, T4TOTAL, T3FREE, THYROIDAB in the last 72 hours.  Invalid input(s): FREET3 Anemia work up No  results for input(s): VITAMINB12, FOLATE, FERRITIN, TIBC, IRON, RETICCTPCT in the last 72 hours. Urinalysis    Component Value Date/Time   COLORURINE YELLOW 05/06/2020 [place] 05/06/2020 0954   LABSPEC 1.009 05/06/2020 0954   PHURINE 7.0 05/06/2020 0954   GLUCOSEU NEGATIVE 05/06/2020 0954   HGBUR NEGATIVE 05/06/2020 0954   BILIRUBINUR NEGATIVE 07/29/2019 1849   KETONESUR NEGATIVE 05/06/2020 0954   PROTEINUR NEGATIVE 05/06/2020 0954   UROBILINOGEN 0.2 03/29/2014 1546   NITRITE NEGATIVE  05/06/2020 0954   LEUKOCYTESUR NEGATIVE 05/06/2020 0954   Sepsis Labs Invalid input(s): PROCALCITONIN,  WBC,  LACTICIDVEN Microbiology Recent Results (from the past 240 hour(s))  SARS CORONAVIRUS 2 (TAT 6-24 HRS) Nasopharyngeal Nasopharyngeal Swab     Status: None   Collection Time: 10/08/20  6:09 PM   Specimen: Nasopharyngeal Swab  Result Value Ref Range Status   SARS Coronavirus 2 NEGATIVE NEGATIVE Final    Comment: (NOTE) SARS-CoV-2 target nucleic acids are NOT DETECTED.  The SARS-CoV-2 RNA is generally detectable in upper and lower respiratory specimens during the acute phase of infection. Negative results do not preclude SARS-CoV-2 infection, do not rule out co-infections with other pathogens, and should not be used as the sole basis for treatment or other patient management decisions. Negative results must be combined with clinical observations, patient history, and epidemiological information. The expected result is Negative.  Fact Sheet for Patients: SugarRoll.be  Fact Sheet for Healthcare Providers: https://www.woods-mathews.com/  This test is not yet approved or cleared by the [place] FDA and  has been authorized for detection and/or diagnosis of SARS-CoV-2 by FDA under an Emergency Use Authorization (EUA). This EUA will remain  in effect (meaning this test can be used) for the duration of the COVID-19 declaration under Se ction 564(b)(1) of the Act, 21 U.S.C. section 360bbb-3(b)(1), unless the authorization is terminated or revoked sooner.  Performed at [facility], [place] [address] [place], [place] [address]    Time coordinating discharge: 35 minutes  SIGNED:  Kerney Elbe, DO [facility] 10/10/2020, 6:41 PM Pager is on [place]  If 7PM-7AM, please contact night-coverage [email or web address]

## 2020-10-10 NOTE — Evaluation (Signed)
Physical Therapy Evaluation Patient Details Name: [name] MRN: [id number] DOB: [birth year] Today's Date: 10/10/2020   History of Present Illness  75 y.o. male with medical history significant of alcoholic liver cirrhosis with ascites, suspected hepatocellular carcinoma previously, diastolic dysfunction CHF, coronary artery disease with preserved ejection fraction, GERD, morbid obesity, history of GI bleed in the past who presents to the ER with progressive weakness lower extremity edema and exertional dyspnea.    Clinical Impression  Pt in bed upon arrival of PT, agreeable to evaluation at this time. Prior to admission the pt was mobilizing without use of AD, independent with ADLs, receiving assist for IADLs from family at home. The pt now presents with limitations in functional mobility, stability, and endurance due to above dx, and will continue to benefit from skilled PT to address these deficits both acutely and following d/c home. The pt was able to complete initial transfers from EOB as well as ambulation in the hall without use of AD today, but due to rapid onset of progressive fatigue, the pt's gait speed slowed, experienced increased DOE, and required increased standing rest breaks with continued walking. The pt needed cues for energy conservation and pacing, but was able to identify need for AD use at home due to current instability. The pt will be safe to d/c home with family support and current equipment, but will benefit from continued skilled PT to address functional mobility, endurance, and energy conservation strategies.       Home health PT;Supervision for mobility/OOB    Equipment Recommendations   (pt has needed equipment)    Recommendations for Other Services       Precautions / Restrictions Precautions Precautions: Fall Restrictions Weight Bearing Restrictions: No      Mobility  Bed Mobility Overal bed mobility: Modified Independent             General  bed mobility comments: sitting EOB upon arrival    Transfers Overall transfer level: Needs assistance Equipment used: None Transfers: Sit to/from Stand Sit to Stand: Supervision         General transfer comment: able to stand from EOB without assist to power up or steady, increased time  Ambulation/Gait Ambulation/Gait assistance: Min guard;Min assist Gait Distance (Feet): 150 Feet Assistive device: 1 person hand held assist Gait Pattern/deviations: Step-through pattern;Decreased stride length;Shuffle Gait velocity: decreaed   General Gait Details: pt with decreased stride length and progressively slower gait with fatigue. minG without UE support initially, slowed to minA with single UE supported on rail with multiple standing rest breaks. Poor insight to need for rest and recovery      Balance Overall balance assessment: Mild deficits observed, not formally tested                                           Pertinent Vitals/Pain Pain Assessment: No/denies pain    Home Living Family/patient expects to be discharged to:: Private residence Living Arrangements: Spouse/significant other;Children;Other relatives Available Help at Discharge: Family;Available 24 hours/day Type of Home: House Home Access: Stairs to enter Entrance Stairs-Rails: Left Entrance Stairs-Number of Steps: 1 Home Layout: Two level;Able to live on main level with bedroom/bathroom Home Equipment: Kasandra Knudsen - single point;Walker - 2 wheels;Shower seat;Bedside commode      Prior Function Level of Independence: Independent         Comments: pt has been walking without AD  in home, uses motorized scooter for errands     Hand Dominance   Dominant Hand: Right    Extremity/Trunk Assessment   Upper Extremity Assessment Upper Extremity Assessment: Overall WFL for tasks assessed    Lower Extremity Assessment Lower Extremity Assessment: Defer to PT evaluation    Cervical / Trunk  Assessment Cervical / Trunk Assessment: Normal Cervical / Trunk Exceptions: large body habitus  Communication   Communication: HOH  Cognition Arousal/Alertness: Awake/alert Behavior During Therapy: WFL for tasks assessed/performed Overall Cognitive Status: Impaired/Different from baseline Area of Impairment: Orientation;Attention;Memory;Safety/judgement;Awareness;Problem solving                 Orientation Level: Disoriented to;Time Current Attention Level: Sustained Memory: Decreased short-term memory   Safety/Judgement: Decreased awareness of safety;Decreased awareness of deficits Awareness: Emergent Problem Solving: Slow processing General Comments: Scored a 18/28 on the Short Blessed Test on concentration and Memory, indicaitng impairment with cognition (impairment consistent wtih need for evaluation of dementing disorder -  range is 10 or more)      General Comments General comments (skin integrity, edema, etc.): uses a pillbox for medication management - son fills it and makes sure he takes them; does not drive    Exercises Other Exercises Other Exercises: pursed lip breathing   Assessment/Plan    PT Assessment Patient needs continued PT services  PT Problem List Decreased activity tolerance;Decreased balance;Decreased coordination;Decreased safety awareness;Cardiopulmonary status limiting activity       PT Treatment Interventions Gait training;Stair training;Functional mobility training;Therapeutic activities;Therapeutic exercise;Balance training;Cognitive remediation;Patient/family education    PT Goals (Current goals can be found in the Care Plan section)  Acute Rehab PT Goals Patient Stated Goal: to go home PT Goal Formulation: With patient Time For Goal Achievement: 10/24/20 Potential to Achieve Goals: Good    Frequency Min 3X/week    AM-PAC PT "6 Clicks" Mobility  Outcome Measure Help needed turning from your back to your side while in a flat bed  without using bedrails?: None Help needed moving from lying on your back to sitting on the side of a flat bed without using bedrails?: None Help needed moving to and from a bed to a chair (including a wheelchair)?: A Little Help needed standing up from a chair using your arms (e.g., wheelchair or bedside chair)?: A Little Help needed to walk in hospital room?: A Little Help needed climbing 3-5 steps with a railing? : A Little 6 Click Score: 20    End of Session Equipment Utilized During Treatment: Gait belt Activity Tolerance: Patient tolerated treatment well Patient left: in bed (with OT, sitting EOB) Nurse Communication: Mobility status PT Visit Diagnosis: Other abnormalities of gait and mobility (R26.89)    Time: 2671-2458 PT Time Calculation (min) (ACUTE ONLY): 19 min   Charges:   PT Evaluation $PT Eval Low Complexity: 1 Low          Karma Ganja, PT, DPT   Acute Rehabilitation Department Pager #: [phone]  Otho Bellows 10/10/2020, 1:55 PM

## 2020-10-10 NOTE — Progress Notes (Signed)
Subjective: No pain. No blood in stool. Wants to go home.  Objective: Vital signs in last 24 hours: Temp:  [97.7 F (36.5 C)-98.8 F (37.1 C)] 98 F (36.7 C) (02/12 0410) Pulse Rate:  [62-73] 70 (02/12 0410) Resp:  [14-18] 16 (02/12 0410) BP: (127-147)/(46-78) 136/58 (02/12 0410) SpO2:  [97 %-100 %] 100 % (02/12 1125) Weight:  [104.3 kg-111.9 kg] 111.8 kg (02/12 0410) Weight change:  Last BM Date: 10/08/20  PE: GEN:  NAD  Lab Results: CBC    Component Value Date/Time   WBC 4.4 10/10/2020 0333   RBC 3.10 (L) 10/10/2020 0333   HGB 7.2 (L) 10/10/2020 0333   HCT 23.6 (L) 10/10/2020 0333   PLT 135 (L) 10/10/2020 0333   MCV 76.1 (L) 10/10/2020 0333   MCH 23.2 (L) 10/10/2020 0333   MCHC 30.5 10/10/2020 0333   RDW 20.4 (H) 10/10/2020 0333   LYMPHSABS 0.4 (L) 10/10/2020 0333   MONOABS 0.3 10/10/2020 0333   EOSABS 0.2 10/10/2020 0333   BASOSABS 0.0 10/10/2020 0333   CMP     Component Value Date/Time   NA 136 10/10/2020 0333   NA 139 08/25/2020 1420   K 4.2 10/10/2020 0333   CL 105 10/10/2020 0333   CO2 23 10/10/2020 0333   GLUCOSE 95 10/10/2020 0333   BUN 32 (H) 10/10/2020 0333   BUN 26 08/25/2020 1420   CREATININE 1.31 (H) 10/10/2020 0333   CREATININE 0.75 06/09/2015 1609   CALCIUM 8.5 (L) 10/10/2020 0333   PROT 6.8 10/10/2020 0333   ALBUMIN 3.5 10/10/2020 0333   AST 24 10/10/2020 0333   ALT 18 10/10/2020 0333   ALKPHOS 124 10/10/2020 0333   BILITOT 2.0 (H) 10/10/2020 0333   GFRNONAA 57 (L) 10/10/2020 0333   GFRAA 59 (L) 08/25/2020 1420   Assessment:  1.  Cirrhosis, alcohol-mediated. 2.  Hepatoma, treated with radiation. 3.  Recurrent anemia.  No overt GI bleeding.  Suspect portal HTN/gastropathy mediated.  Plan:  1.  I advised colonoscopy to make sure no other source of anemia is noted; patient doesn't want that now, and wants to go home and consider it as outpatient.  I have advised that he may end up back in hospital beforehand; he still wants to go  home. 2.   In light of #1, would advance diet, PPI, avoid alcohol and have patient follow-up with our office in 1 week for CBC check and 3-4 weeks for office visit. 3.  [facility] will sign off; please call with questions; I will make arrangements for outpatient follow-up; thank you for the consultation.   [name] 10/10/2020, 12:00 PM   Cell [phone] If no answer or after 5 PM call [phone]

## 2020-10-10 NOTE — Progress Notes (Signed)
Occupational Therapy Evaluation Patient Details Name: [name] MRN: [id number] DOB: May 26, 1946 Today's Date: 10/10/2020    History of Present Illness 75 y.o. male with medical history significant of alcoholic liver cirrhosis with ascites, suspected hepatocellular carcinoma previously, diastolic dysfunction CHF, coronary artery disease with preserved ejection fraction, GERD, morbid obesity, history of GI bleed in the past who presents to the ER with progressive weakness lower extremity edema and exertional dyspnea.   Clinical Impression   Pt presents from home where he lives with his wife and family. Pt with apparent cognitive deficits as noted by scoring an 18/28 on the Short Blessed Test of Memory and Concentration, which is most likely his baseline. Pt states his son assists him with medication management and that he does not drive. Pt with 2/4 DOE during ADL tasks with SpO2 remaining above 90 on RA. Pt does not weigh himself daily, limit his fluid intake or follow a strict diet limited salt. Will follow acutely to facilitate safe DC home with HHOT.  Pt complaining of blurry vision at times - recommend follow up with an eye doctor. Pt states he has not been in @ 4-5 years.     Follow Up Recommendations  Home health OT;Supervision - Intermittent    Equipment Recommendations  None recommended by OT    Recommendations for Other Services       Precautions / Restrictions Precautions Precautions: Fall      Mobility Bed Mobility Overal bed mobility: Modified Independent                  Transfers Overall transfer level: Needs assistance   Transfers: Sit to/from Stand Sit to Stand: Supervision              Balance Overall balance assessment: Mild deficits observed, not formally tested                                         ADL either performed or assessed with clinical judgement   ADL Overall ADL's : Needs assistance/impaired Eating/Feeding:  Independent   Grooming: Set up;Sitting   Upper Body Bathing: Set up;Sitting   Lower Body Bathing: Supervison/ safety;Sit to/from stand   Upper Body Dressing : Set up;Sitting   Lower Body Dressing: Supervision/safety;Sit to/from stand   Toilet Transfer: Supervision/safety   Toileting- Water quality scientist and Hygiene: Supervision/safety       Functional mobility during ADLs: Min guard (Pt furntiure walking in room)       Vision   Additional Comments: complaining of blurred vision at times     Perception     Praxis      Pertinent Vitals/Pain Pain Assessment: No/denies pain     Hand Dominance Right   Extremity/Trunk Assessment Upper Extremity Assessment Upper Extremity Assessment: Overall WFL for tasks assessed   Lower Extremity Assessment Lower Extremity Assessment: Defer to PT evaluation   Cervical / Trunk Assessment Cervical / Trunk Assessment: Normal   Communication Communication Communication: HOH   Cognition Arousal/Alertness: Awake/alert Behavior During Therapy: WFL for tasks assessed/performed Overall Cognitive Status: Impaired/Different from baseline Area of Impairment: Orientation;Attention;Memory;Safety/judgement;Awareness;Problem solving                 Orientation Level: Disoriented to;Time Current Attention Level: Sustained Memory: Decreased short-term memory   Safety/Judgement: Decreased awareness of safety;Decreased awareness of deficits Awareness: Emergent Problem Solving: Slow processing General Comments: Scored a 18/28 on the  Short Blessed Test on concentration and Memory, indicaitng impairment with cognition (impairment consistent wtih need for evaluation of dementing disorder -  range is 10 or more)   General Comments  uses a pillbox for medication management - son fills it and makes sure he takes them; does not drive    Exercises Exercises: Other exercises Other Exercises Other Exercises: pursed lip breathing   Shoulder  Instructions      Home Living Family/patient expects to be discharged to:: Private residence Living Arrangements: Spouse/significant other;Children;Other relatives Available Help at Discharge: Family;Available 24 hours/day Type of Home: House Home Access: Stairs to enter [employer] of Steps: 1 Entrance Stairs-Rails: Left Home Layout: Two level;Able to live on main level with bedroom/bathroom     Bathroom Shower/Tub: Teacher, early years/pre: Standard Bathroom Accessibility: Yes How Accessible: Accessible via walker Home Equipment: [place] - single point;Walker - 2 wheels;Shower seat;Bedside commode          Prior Functioning/Environment Level of Independence: Independent        Comments: pt has been walking without AD in home, uses motorized scooter for errands        OT Problem List: Decreased activity tolerance;Decreased safety awareness;Decreased knowledge of use of DME or AE;Cardiopulmonary status limiting activity;Obesity;Increased edema      OT Treatment/Interventions: Self-care/ADL training;Therapeutic exercise;Energy conservation;DME and/or AE instruction;Therapeutic activities;Cognitive remediation/compensation;Patient/family education;Balance training    OT Goals(Current goals can be found in the care plan section) Acute Rehab OT Goals Patient Stated Goal: to go home OT Goal Formulation: With patient Time For Goal Achievement: 10/24/20 Potential to Achieve Goals: Good  OT Frequency: Min 2X/week   Barriers to D/C:            Co-evaluation              AM-PAC OT "6 Clicks" Daily Activity     Outcome Measure Help from another person eating meals?: None Help from another person taking care of personal grooming?: A Little Help from another person toileting, which includes using toliet, bedpan, or urinal?: A Little Help from another person bathing (including washing, rinsing, drying)?: A Little Help from another person to put on and  taking off regular upper body clothing?: A Little Help from another person to put on and taking off regular lower body clothing?: A Little 6 Click Score: 19   End of Session Nurse Communication: Mobility status  Activity Tolerance: Patient tolerated treatment well Patient left: in chair;with call bell/phone within reach  OT Visit Diagnosis: Unsteadiness on feet (R26.81);Muscle weakness (generalized) (M62.81);Other symptoms and signs involving cognitive function                Time: 6063-0160 OT Time Calculation (min): 20 min Charges:  OT General Charges $OT Visit: 1 Visit OT Evaluation $OT Eval Moderate Complexity: [place], OT/L   Acute OT Clinical Specialist Acute Rehabilitation Services Pager [phone] Office [phone]   [facility] 10/10/2020, 1:48 PM

## 2020-10-10 NOTE — TOC Transition Note (Signed)
Transition of Care [facility]) - CM/SW Discharge Note   Patient Details  Name: [name] MRN: [id number] Date of Birth: 1946/08/02  Transition of [facility]) CM/SW Contact:  Konrad Penta, RN Phone Number: [phone] 10/10/2020, 4:59 PM   Clinical Narrative:   Spoke with patient at bedside. Lives with family. Discussed home health for PT/OT services. Patient could not remember agency name. Patient states he has all he DME he needs at home already.  Spoke with [place] with [facility]. [facility] had open case and will follow patient when he gets home.    No further needs assessed.      Final next level of care: [place] Barriers to Discharge: No Barriers Identified   Patient Goals and CMS Choice Patient states their goals for this hospitalization and ongoing recovery are:: return home CMS Medicare.gov Compare Post Acute Care list provided to:: Patient Choice offered to / list presented to : Patient  Discharge Placement                       Discharge Plan and Services                  DME Agency: NA       HH Arranged: PT,OT [place] Agency: [facility] (formerly [employer]) Date [place]: 10/10/20 Time [place]: [place] Representative spoke with at [place]: [place]  Social Determinants of Health ([place]) Interventions     Readmission Risk Interventions Readmission Risk Prevention Plan 08/12/2020 08/12/2020 12/30/2019  Transportation Screening - Complete Complete  PCP or Specialist Appt within 5-7 Days - - -  Not Complete comments - - -  PCP or Specialist Appt within 3-5 Days Complete - -  Home Care Screening - - -  Medication Review (RN CM) - - -  [place] or [place] - Complete -  Social Work Consult for Camp Planning/Counseling - Complete -  Palliative Care Screening - Not Applicable Not Applicable  Medication Review (RN Care Manager) - - Complete  Some recent data might be hidden     Marthenia Rolling, MSN, RN,BSN Inpatient [facility] Case Manager [phone]

## 2020-10-11 ENCOUNTER — Encounter (HOSPITAL_COMMUNITY): Payer: Self-pay | Admitting: Gastroenterology

## 2020-10-11 LAB — BPAM RBC
Blood Product Expiration Date: 202203082359
Blood Product Expiration Date: 202203122359
Blood Product Expiration Date: 202203122359
Blood Product Expiration Date: 202203172359
ISSUE DATE / TIME: 202202101946
ISSUE DATE / TIME: 202202110004
ISSUE DATE / TIME: 202202110756
ISSUE DATE / TIME: 202202121428
Unit Type and Rh: 5100
Unit Type and Rh: 5100
Unit Type and Rh: 5100
Unit Type and Rh: 5100

## 2020-10-11 LAB — TYPE AND SCREEN
ABO/RH(D): O POS
Antibody Screen: NEGATIVE
Unit division: 0
Unit division: 0
Unit division: 0
Unit division: 0

## 2020-11-02 ENCOUNTER — Ambulatory Visit: Payer: Medicare HMO | Admitting: Cardiovascular Disease

## 2020-11-17 DIAGNOSIS — I951 Orthostatic hypotension: Secondary | ICD-10-CM | POA: Diagnosis not present

## 2020-11-17 DIAGNOSIS — E119 Type 2 diabetes mellitus without complications: Secondary | ICD-10-CM | POA: Diagnosis not present

## 2020-11-17 DIAGNOSIS — Z6836 Body mass index (BMI) 36.0-36.9, adult: Secondary | ICD-10-CM | POA: Diagnosis not present

## 2020-11-17 DIAGNOSIS — Z7984 Long term (current) use of oral hypoglycemic drugs: Secondary | ICD-10-CM | POA: Diagnosis not present

## 2020-11-17 DIAGNOSIS — C220 Liver cell carcinoma: Secondary | ICD-10-CM | POA: Diagnosis not present

## 2020-11-17 DIAGNOSIS — E669 Obesity, unspecified: Secondary | ICD-10-CM | POA: Diagnosis not present

## 2020-11-18 ENCOUNTER — Encounter (HOSPITAL_COMMUNITY): Payer: Self-pay

## 2020-11-18 ENCOUNTER — Inpatient Hospital Stay (HOSPITAL_COMMUNITY)
Admission: EM | Admit: 2020-11-18 | Discharge: 2020-11-23 | DRG: 811 | Disposition: A | Discharge status: Home / Self Care | Payer: Medicare HMO | Source: Ambulatory Visit | Attending: Family Medicine | Admitting: Family Medicine

## 2020-11-18 ENCOUNTER — Emergency Department (HOSPITAL_COMMUNITY): Payer: Medicare HMO

## 2020-11-18 ENCOUNTER — Other Ambulatory Visit: Payer: Self-pay

## 2020-11-18 DIAGNOSIS — I130 Hypertensive heart and chronic kidney disease with heart failure and stage 1 through stage 4 chronic kidney disease, or unspecified chronic kidney disease: Secondary | ICD-10-CM | POA: Diagnosis not present

## 2020-11-18 DIAGNOSIS — K5520 Angiodysplasia of colon without hemorrhage: Secondary | ICD-10-CM | POA: Diagnosis present

## 2020-11-18 DIAGNOSIS — Z801 Family history of malignant neoplasm of trachea, bronchus and lung: Secondary | ICD-10-CM | POA: Diagnosis not present

## 2020-11-18 DIAGNOSIS — K2970 Gastritis, unspecified, without bleeding: Secondary | ICD-10-CM | POA: Diagnosis present

## 2020-11-18 DIAGNOSIS — I864 Gastric varices: Secondary | ICD-10-CM | POA: Diagnosis not present

## 2020-11-18 DIAGNOSIS — U071 COVID-19: Secondary | ICD-10-CM | POA: Diagnosis not present

## 2020-11-18 DIAGNOSIS — N183 Chronic kidney disease, stage 3 unspecified: Secondary | ICD-10-CM | POA: Diagnosis present

## 2020-11-18 DIAGNOSIS — N1831 Chronic kidney disease, stage 3a: Secondary | ICD-10-CM

## 2020-11-18 DIAGNOSIS — I509 Heart failure, unspecified: Secondary | ICD-10-CM

## 2020-11-18 DIAGNOSIS — Z8249 Family history of ischemic heart disease and other diseases of the circulatory system: Secondary | ICD-10-CM

## 2020-11-18 DIAGNOSIS — Z87442 Personal history of urinary calculi: Secondary | ICD-10-CM | POA: Diagnosis not present

## 2020-11-18 DIAGNOSIS — D61818 Other pancytopenia: Secondary | ICD-10-CM | POA: Diagnosis present

## 2020-11-18 DIAGNOSIS — Z955 Presence of coronary angioplasty implant and graft: Secondary | ICD-10-CM

## 2020-11-18 DIAGNOSIS — R0600 Dyspnea, unspecified: Secondary | ICD-10-CM | POA: Diagnosis not present

## 2020-11-18 DIAGNOSIS — D500 Iron deficiency anemia secondary to blood loss (chronic): Principal | ICD-10-CM | POA: Diagnosis present

## 2020-11-18 DIAGNOSIS — R9431 Abnormal electrocardiogram [ECG] [EKG]: Secondary | ICD-10-CM | POA: Diagnosis not present

## 2020-11-18 DIAGNOSIS — J45909 Unspecified asthma, uncomplicated: Secondary | ICD-10-CM | POA: Diagnosis present

## 2020-11-18 DIAGNOSIS — K922 Gastrointestinal hemorrhage, unspecified: Secondary | ICD-10-CM

## 2020-11-18 DIAGNOSIS — Z800 Family history of malignant neoplasm of digestive organs: Secondary | ICD-10-CM

## 2020-11-18 DIAGNOSIS — I5032 Chronic diastolic (congestive) heart failure: Secondary | ICD-10-CM | POA: Diagnosis not present

## 2020-11-18 DIAGNOSIS — Z951 Presence of aortocoronary bypass graft: Secondary | ICD-10-CM | POA: Diagnosis not present

## 2020-11-18 DIAGNOSIS — Z8505 Personal history of malignant neoplasm of liver: Secondary | ICD-10-CM

## 2020-11-18 DIAGNOSIS — K766 Portal hypertension: Secondary | ICD-10-CM | POA: Diagnosis not present

## 2020-11-18 DIAGNOSIS — K6389 Other specified diseases of intestine: Secondary | ICD-10-CM | POA: Diagnosis not present

## 2020-11-18 DIAGNOSIS — I8500 Esophageal varices without bleeding: Secondary | ICD-10-CM | POA: Diagnosis not present

## 2020-11-18 DIAGNOSIS — Z79899 Other long term (current) drug therapy: Secondary | ICD-10-CM | POA: Diagnosis not present

## 2020-11-18 DIAGNOSIS — G4733 Obstructive sleep apnea (adult) (pediatric): Secondary | ICD-10-CM | POA: Diagnosis present

## 2020-11-18 DIAGNOSIS — K7031 Alcoholic cirrhosis of liver with ascites: Secondary | ICD-10-CM | POA: Diagnosis present

## 2020-11-18 DIAGNOSIS — Z833 Family history of diabetes mellitus: Secondary | ICD-10-CM | POA: Diagnosis not present

## 2020-11-18 DIAGNOSIS — I10 Essential (primary) hypertension: Secondary | ICD-10-CM | POA: Diagnosis not present

## 2020-11-18 DIAGNOSIS — D649 Anemia, unspecified: Secondary | ICD-10-CM

## 2020-11-18 DIAGNOSIS — Z7984 Long term (current) use of oral hypoglycemic drugs: Secondary | ICD-10-CM

## 2020-11-18 DIAGNOSIS — Z87891 Personal history of nicotine dependence: Secondary | ICD-10-CM | POA: Diagnosis not present

## 2020-11-18 DIAGNOSIS — I8510 Secondary esophageal varices without bleeding: Secondary | ICD-10-CM | POA: Diagnosis not present

## 2020-11-18 DIAGNOSIS — R69 Illness, unspecified: Secondary | ICD-10-CM | POA: Diagnosis not present

## 2020-11-18 DIAGNOSIS — J811 Chronic pulmonary edema: Secondary | ICD-10-CM | POA: Diagnosis not present

## 2020-11-18 DIAGNOSIS — K635 Polyp of colon: Secondary | ICD-10-CM | POA: Diagnosis present

## 2020-11-18 DIAGNOSIS — E118 Type 2 diabetes mellitus with unspecified complications: Secondary | ICD-10-CM | POA: Diagnosis present

## 2020-11-18 DIAGNOSIS — C220 Liver cell carcinoma: Secondary | ICD-10-CM | POA: Diagnosis not present

## 2020-11-18 DIAGNOSIS — I517 Cardiomegaly: Secondary | ICD-10-CM | POA: Diagnosis not present

## 2020-11-18 DIAGNOSIS — R531 Weakness: Secondary | ICD-10-CM | POA: Diagnosis not present

## 2020-11-18 DIAGNOSIS — I8511 Secondary esophageal varices with bleeding: Secondary | ICD-10-CM | POA: Diagnosis not present

## 2020-11-18 DIAGNOSIS — K621 Rectal polyp: Secondary | ICD-10-CM | POA: Diagnosis present

## 2020-11-18 DIAGNOSIS — D123 Benign neoplasm of transverse colon: Secondary | ICD-10-CM | POA: Diagnosis not present

## 2020-11-18 DIAGNOSIS — I2510 Atherosclerotic heart disease of native coronary artery without angina pectoris: Secondary | ICD-10-CM | POA: Diagnosis present

## 2020-11-18 DIAGNOSIS — I5030 Unspecified diastolic (congestive) heart failure: Secondary | ICD-10-CM | POA: Diagnosis present

## 2020-11-18 DIAGNOSIS — K219 Gastro-esophageal reflux disease without esophagitis: Secondary | ICD-10-CM | POA: Diagnosis present

## 2020-11-18 DIAGNOSIS — E785 Hyperlipidemia, unspecified: Secondary | ICD-10-CM | POA: Diagnosis present

## 2020-11-18 DIAGNOSIS — K7030 Alcoholic cirrhosis of liver without ascites: Secondary | ICD-10-CM | POA: Diagnosis not present

## 2020-11-18 DIAGNOSIS — K7460 Unspecified cirrhosis of liver: Secondary | ICD-10-CM | POA: Diagnosis not present

## 2020-11-18 DIAGNOSIS — K5900 Constipation, unspecified: Secondary | ICD-10-CM | POA: Diagnosis not present

## 2020-11-18 LAB — PREPARE RBC (CROSSMATCH)

## 2020-11-18 LAB — URINALYSIS, ROUTINE W REFLEX MICROSCOPIC
Bilirubin Urine: NEGATIVE
Glucose, UA: NEGATIVE mg/dL
Hgb urine dipstick: NEGATIVE
Ketones, ur: NEGATIVE mg/dL
Leukocytes,Ua: NEGATIVE
Nitrite: NEGATIVE
Protein, ur: NEGATIVE mg/dL
Specific Gravity, Urine: 1.008 (ref 1.005–1.030)
pH: 6 (ref 5.0–8.0)

## 2020-11-18 LAB — CBC
HCT: 16.5 % — ABNORMAL LOW (ref 39.0–52.0)
Hemoglobin: 4.4 g/dL — CL (ref 13.0–17.0)
MCH: 19.1 pg — ABNORMAL LOW (ref 26.0–34.0)
MCHC: 26.7 g/dL — ABNORMAL LOW (ref 30.0–36.0)
MCV: 71.7 fL — ABNORMAL LOW (ref 80.0–100.0)
Platelets: 155 10*3/uL (ref 150–400)
RBC: 2.3 MIL/uL — ABNORMAL LOW (ref 4.22–5.81)
RDW: 20.7 % — ABNORMAL HIGH (ref 11.5–15.5)
WBC: 4.6 10*3/uL (ref 4.0–10.5)
nRBC: 0 % (ref 0.0–0.2)

## 2020-11-18 LAB — COMPREHENSIVE METABOLIC PANEL
ALT: 22 U/L (ref 0–44)
AST: 27 U/L (ref 15–41)
Albumin: 3.6 g/dL (ref 3.5–5.0)
Alkaline Phosphatase: 166 U/L — ABNORMAL HIGH (ref 38–126)
Anion gap: 11 (ref 5–15)
BUN: 42 mg/dL — ABNORMAL HIGH (ref 8–23)
CO2: 20 mmol/L — ABNORMAL LOW (ref 22–32)
Calcium: 8.7 mg/dL — ABNORMAL LOW (ref 8.9–10.3)
Chloride: 102 mmol/L (ref 98–111)
Creatinine, Ser: 1.41 mg/dL — ABNORMAL HIGH (ref 0.61–1.24)
GFR, Estimated: 52 mL/min — ABNORMAL LOW (ref >60)
Glucose, Bld: 106 mg/dL — ABNORMAL HIGH (ref 70–99)
Potassium: 4.2 mmol/L (ref 3.5–5.1)
Sodium: 133 mmol/L — ABNORMAL LOW (ref 135–145)
Total Bilirubin: 1.4 mg/dL — ABNORMAL HIGH (ref 0.3–1.2)
Total Protein: 7.2 g/dL (ref 6.5–8.1)

## 2020-11-18 LAB — RESP PANEL BY RT-PCR (FLU A&B, COVID) ARPGX2
Influenza A by PCR: NEGATIVE
Influenza B by PCR: NEGATIVE
SARS Coronavirus 2 by RT PCR: POSITIVE — AB

## 2020-11-18 LAB — PROTIME-INR
INR: 1.2 (ref 0.8–1.2)
Prothrombin Time: 15.1 seconds (ref 11.4–15.2)

## 2020-11-18 LAB — POC OCCULT BLOOD, ED: Fecal Occult Bld: NEGATIVE

## 2020-11-18 LAB — HEMOGLOBIN AND HEMATOCRIT, BLOOD
HCT: 19.8 % — ABNORMAL LOW (ref 39.0–52.0)
Hemoglobin: 5.4 g/dL — CL (ref 13.0–17.0)

## 2020-11-18 MED ORDER — NITROGLYCERIN 0.4 MG SL SUBL: 0.4000 mg | SUBLINGUAL_TABLET | SUBLINGUAL | Status: DC | PRN

## 2020-11-18 MED ORDER — PANTOPRAZOLE SODIUM 40 MG IV SOLR
40.0000 mg | Freq: Once | INTRAVENOUS | Status: AC
  Administered 2020-11-18: 40 mg via INTRAVENOUS
  Filled 2020-11-18: qty 40

## 2020-11-18 MED ORDER — MAGNESIUM HYDROXIDE 400 MG/5ML PO SUSP
60.0000 mL | Freq: Once | ORAL | Status: AC
  Administered 2020-11-18: 60 mL via ORAL
  Filled 2020-11-18: qty 60

## 2020-11-18 MED ORDER — GABAPENTIN 100 MG PO CAPS
100.0000 mg | ORAL_CAPSULE | Freq: Two times a day (BID) | ORAL | Status: DC
  Administered 2020-11-18 – 2020-11-23 (×10): 100 mg via ORAL
  Filled 2020-11-18 (×10): qty 1

## 2020-11-18 MED ORDER — ACETAMINOPHEN 500 MG PO TABS: 500.0000 mg | ORAL_TABLET | Freq: Four times a day (QID) | ORAL | Status: DC | PRN

## 2020-11-18 MED ORDER — PEG 3350-KCL-NA BICARB-NACL 420 G PO SOLR
4000.0000 mL | Freq: Once | ORAL | Status: AC
  Administered 2020-11-19: 4000 mL via ORAL
  Filled 2020-11-18: qty 4000

## 2020-11-18 MED ORDER — PANTOPRAZOLE SODIUM 40 MG IV SOLR
40.0000 mg | Freq: Two times a day (BID) | INTRAVENOUS | Status: DC
  Administered 2020-11-18 – 2020-11-20 (×4): 40 mg via INTRAVENOUS
  Filled 2020-11-18 (×4): qty 40

## 2020-11-18 MED ORDER — UMECLIDINIUM-VILANTEROL 62.5-25 MCG/INH IN AEPB
1.0000 | INHALATION_SPRAY | Freq: Every day | RESPIRATORY_TRACT | Status: DC
  Administered 2020-11-19 – 2020-11-23 (×5): 1 via RESPIRATORY_TRACT
  Filled 2020-11-18: qty 14

## 2020-11-18 MED ORDER — SODIUM CHLORIDE 0.9% FLUSH
3.0000 mL | Freq: Two times a day (BID) | INTRAVENOUS | Status: DC
  Administered 2020-11-18 – 2020-11-23 (×9): 3 mL via INTRAVENOUS

## 2020-11-18 MED ORDER — METOPROLOL TARTRATE 5 MG/5ML IV SOLN: 5.0000 mg | Freq: Four times a day (QID) | INTRAVENOUS | Status: DC | PRN

## 2020-11-18 MED ORDER — ALBUTEROL SULFATE HFA 108 (90 BASE) MCG/ACT IN AERS: 2.0000 | INHALATION_SPRAY | Freq: Four times a day (QID) | RESPIRATORY_TRACT | Status: DC | PRN

## 2020-11-18 MED ORDER — SPIRONOLACTONE 25 MG PO TABS
25.0000 mg | ORAL_TABLET | Freq: Every day | ORAL | Status: DC
  Administered 2020-11-18 – 2020-11-23 (×6): 25 mg via ORAL
  Filled 2020-11-18 (×6): qty 1

## 2020-11-18 MED ORDER — FUROSEMIDE 10 MG/ML IJ SOLN
60.0000 mg | Freq: Once | INTRAMUSCULAR | Status: AC
  Administered 2020-11-18: 60 mg via INTRAVENOUS
  Filled 2020-11-18: qty 8

## 2020-11-18 MED ORDER — ATORVASTATIN CALCIUM 40 MG PO TABS
40.0000 mg | ORAL_TABLET | Freq: Every day | ORAL | Status: DC
  Administered 2020-11-18 – 2020-11-23 (×6): 40 mg via ORAL
  Filled 2020-11-18 (×6): qty 1

## 2020-11-18 MED ORDER — SODIUM CHLORIDE 0.9 % IV SOLN
10.0000 mL/h | Freq: Once | INTRAVENOUS | Status: AC
  Administered 2020-11-18: 10 mL/h via INTRAVENOUS

## 2020-11-18 MED ORDER — ACETAMINOPHEN 650 MG RE SUPP
325.0000 mg | Freq: Four times a day (QID) | RECTAL | Status: DC | PRN
Start: 2020-11-18 — End: 2020-11-23

## 2020-11-18 NOTE — H&P (Signed)
History and Physical        Hospital Admission Note Date: 11/18/2020  Patient name: [name] Medical record number: [id number] Date of birth: Jun 12, 1946 Age: 75 y.o. Gender: male  PCP: [name], MD    Chief Complaint    Chief Complaint  Patient presents with  . Weakness      HPI:   This is a 75 year old male with past medical history of [place] s/p treatment 06/2020, cirrhosis with portal hypertension and esophageal varices, CHF, CAD, type 2 diabetes, hypertension, hyperlipidemia, recurrent GI bleed who presented to the ED from the [place] office for evaluation of shortness of breath and generalized weakness x1 week.  Patient has had fatigue and generalized weakness notable on activity with shortness of breath at times.  Has intermittent dark stools.  No recent alcohol, NSAID or anticoagulant use.  No chest pain, syncope or any other complaints.  He does admit to lower extremity edema and has been taking his medications as prescribed.  He was recently hospitalized from 2/10-2/12 for anemia.  EGD on 2/11 showed grade 1 esophageal varices, gastric varices without bleeding and portal hypertensive gastropathy.  Colonoscopy was recommended but patient declined at that time and wanted to complete the colonoscopy as an outpatient.  Last colonoscopy in 2014 showed colonic AVMs and hyperplastic polyps.  He was discharged on Protonix twice daily but he ran out of his prescription.  He was seen in the GI office today for follow-up but due to his worsening symptoms he was referred to the ED.  ED Course: Afebrile, hemodynamically stable, on room air. Notable Labs: Sodium 133, K4.2, BUN 42, creatinine 1.41, alk phos 166, AST 27, ALT 22, T bili 1.4, WBC 4.6, Hb 4.4, MCV 71, platelets 155, INR 1.2, FOBT negative COVID-19 pending. Notable Imaging: CXR-mild cardiogenic failure. Patient  received Protonix 40 mg IV x1.  3 units PRBCs ordered.    Vitals:   11/18/20 1506 11/18/20 1515  BP: 136/68 (!) 143/61  Pulse: 80 81  Resp: 20 18  Temp: 98.4 F (36.9 C)   SpO2:  100%     Review of Systems:  Review of Systems  All other systems reviewed and are negative.   Medical/Social/Family History   Past Medical History: Past Medical History:  Diagnosis Date  . Arthritis    "joints tighten up sometimes" (03/27/2104)  . Carcinoma of liver, hepatocellular ([place]) 06/30/2020  . CHF (congestive heart failure) ([place])   . Chronic lower back pain   . Coronary artery disease    a. 05/2012 Cath/PCI: LM 40ost, 50-60d, LAD 99p ruptured plaque (3.0x28 DES), LCX 50p/m, RCA 30-40p, 20m EF 65-70%.  . Coronary artery disease involving native coronary artery of native heart without angina pectoris    Severe left main disease at catheterization July 2015  CABG x3 with a LIMA to the LAD, SVG to the OM, SVG to the PDA on 03/31/14. EF 60% by cath.   . Diastolic heart failure ([place]   . GERD without esophagitis 08/04/2010  . Hepatic cirrhosis ([place]    a. Dx 01/2014 - CT a/p   . History of blood transfusion    "related to bleeding ulcers"  . History of concussion  1976--  NO RESIDUAL  . History of GI bleed    a. UGIB 07/2012;  b. 01/2014 admission with GIB/FOB stool req 1U prbc's->EGD showed portal gastropathy, barrett's esoph, and chronic active h. pylori gastritis.  Marland Kitchen History of gout    2007 &  2008  LEFT LEG-- NO ISSUE SINCE  . Hyperlipidemia   . Iron deficiency anemia   . Kidney stones   . OA (osteoarthritis of spine)    LOWER BACK--  INTERMITTANT LEFT LEG NUMBNESS  . OSA (obstructive sleep apnea)    PULMOLOGIST-  DR CLANCE--  MODERATE OSA  STARTED CPAP 2012--  BUT CURRENTLY HAS NOT USED PAST 6 MONTHS  . Phimosis    a. s/p circumcision 2015.  . Type 2 diabetes mellitus ([place])   . Unspecified essential hypertension     Past Surgical History:  Procedure Laterality Date  . ANKLE  FRACTURE SURGERY Right 1989   "plate put in"  . APPENDECTOMY  05-16-2004   open  . CIRCUMCISION N/A 09/09/2013   Procedure: CIRCUMCISION ADULT;  Surgeon: Bernestine Amass, MD;  [facility];  Service: Urology;  Laterality: N/A;  . COLECTOMY  05-16-2004  . CORONARY ANGIOPLASTY WITH STENT PLACEMENT  06/28/2012  DR COOPER   PCI W/  X1 DES to [place]. LAD/  LM  40% OSTIAL & 50-60% DISTAL /  50% PROX LCX/  30-40% PROX RCA & 50% MID RCA/   LVEF 65-70%  . CORONARY ARTERY BYPASS GRAFT N/A 03/31/2014   Procedure: CORONARY ARTERY BYPASS GRAFTING (CABG) times 3 using left internal mammary artery and right saphenous vein.;  Surgeon: Melrose Nakayama, MD;  Location: [place];  Service: Open Heart Surgery;  Laterality: N/A;  . DOBUTAMINE STRESS ECHO  06-08-2012   MODERATE HYPOKINESIS/ ISCHEMIA MID INFERIOR WALL  . ESOPHAGOGASTRODUODENOSCOPY  08/15/2012   Procedure: ESOPHAGOGASTRODUODENOSCOPY (EGD);  Surgeon: Wonda Horner, MD;  Location: [facility] ENDOSCOPY;  Service: Endoscopy;  Laterality: N/A;  . ESOPHAGOGASTRODUODENOSCOPY N/A 02/17/2014   Procedure: ESOPHAGOGASTRODUODENOSCOPY (EGD);  Surgeon: Jeryl Columbia, MD;  Location: [facility] ENDOSCOPY;  Service: Endoscopy;  Laterality: N/A;  . ESOPHAGOGASTRODUODENOSCOPY N/A 04/17/2020   Procedure: ESOPHAGOGASTRODUODENOSCOPY (EGD);  Surgeon: Ronnette Juniper, MD;  Location: [place];  Service: Gastroenterology;  Laterality: N/A;  . ESOPHAGOGASTRODUODENOSCOPY N/A 09/11/2020   Procedure: ESOPHAGOGASTRODUODENOSCOPY (EGD);  Surgeon: Clarene Essex, MD;  Location: [facility];  Service: Endoscopy;  Laterality: N/A;  . ESOPHAGOGASTRODUODENOSCOPY  10/09/2020  . ESOPHAGOGASTRODUODENOSCOPY N/A 10/09/2020   Procedure: ESOPHAGOGASTRODUODENOSCOPY (EGD);  Surgeon: Otis Brace, MD;  Location[facility] ENDOSCOPY;  Service: Gastroenterology;  Laterality: N/A;  . ESOPHAGOGASTRODUODENOSCOPY (EGD) WITH PROPOFOL N/A 09/30/2017   Procedure: ESOPHAGOGASTRODUODENOSCOPY (EGD) WITH PROPOFOL;   Surgeon: Wonda Horner, MD;  Location: [facility] ENDOSCOPY;  Service: Endoscopy;  Laterality: N/A;  . ESOPHAGOGASTRODUODENOSCOPY (EGD) WITH PROPOFOL N/A 12/20/2017   Procedure: ESOPHAGOGASTRODUODENOSCOPY (EGD) WITH PROPOFOL;  Surgeon: Clarene Essex, MD;  Location: [place];  Service: Endoscopy;  Laterality: N/A;  . ESOPHAGOGASTRODUODENOSCOPY (EGD) WITH PROPOFOL N/A 08/10/2020   Procedure: ESOPHAGOGASTRODUODENOSCOPY (EGD) WITH PROPOFOL;  Surgeon: Wilford Corner, MD;  Location: [place];  Service: Endoscopy;  Laterality: N/A;  . HERNIA REPAIR    . INTRAOPERATIVE TRANSESOPHAGEAL ECHOCARDIOGRAM N/A 03/31/2014   Procedure: INTRAOPERATIVE TRANSESOPHAGEAL ECHOCARDIOGRAM;  Surgeon: Melrose Nakayama, MD;  Location: [place];  Service: Open Heart Surgery;  Laterality: N/A;  . IR ANGIOGRAM SELECTIVE EACH ADDITIONAL VESSEL  06/16/2020  . IR ANGIOGRAM SELECTIVE EACH ADDITIONAL VESSEL  06/16/2020  . IR ANGIOGRAM SELECTIVE EACH ADDITIONAL VESSEL  06/16/2020  . IR ANGIOGRAM SELECTIVE EACH ADDITIONAL VESSEL  06/16/2020  . IR ANGIOGRAM SELECTIVE EACH ADDITIONAL VESSEL  06/16/2020  . IR ANGIOGRAM SELECTIVE EACH ADDITIONAL VESSEL  06/16/2020  . IR ANGIOGRAM SELECTIVE EACH ADDITIONAL VESSEL  06/16/2020  . IR ANGIOGRAM SELECTIVE EACH ADDITIONAL VESSEL  06/16/2020  . IR ANGIOGRAM SELECTIVE EACH ADDITIONAL VESSEL  06/16/2020  . IR ANGIOGRAM SELECTIVE EACH ADDITIONAL VESSEL  06/30/2020  . IR ANGIOGRAM SELECTIVE EACH ADDITIONAL VESSEL  06/30/2020  . IR ANGIOGRAM VISCERAL SELECTIVE  06/16/2020  . IR ANGIOGRAM VISCERAL SELECTIVE  06/30/2020  . IR EMBO ARTERIAL NOT HEMORR HEMANG INC GUIDE ROADMAPPING  06/16/2020  . IR EMBO TUMOR ORGAN ISCHEMIA INFARCT INC GUIDE ROADMAPPING  06/30/2020  . IR RADIOLOGIST EVAL & MGMT  05/06/2020  . IR RADIOLOGIST EVAL & MGMT  05/20/2020  . IR US GUIDE VASC ACCESS RIGHT  06/16/2020  . IR US GUIDE VASC ACCESS RIGHT  06/30/2020  . LAPAROSCOPIC UMBILICAL HERNIA REPAIR W/ MESH  06-06-2011  . LEFT  HEART CATHETERIZATION WITH CORONARY ANGIOGRAM N/A 03/28/2014   Procedure: LEFT HEART CATHETERIZATION WITH CORONARY ANGIOGRAM;  Surgeon: Sinclair Grooms, MD;  Location[facility] CATH LAB;  Service: Cardiovascular;  Laterality: N/A;  . LIPOMA EXCISION Left 08/25/2017   Procedure: EXCISION LIPOMA LEFT POSTERIOR THIGH;  Surgeon: Clovis Riley, MD;  Location: [place];  Service: General;  Laterality: Left;  . NEPHROLITHOTOMY  1990'S  . OPEN APPENDECTOMY W/ PARTIAL CECECTOMY  05-16-2004  . PERCUTANEOUS CORONARY STENT INTERVENTION (PCI-S) N/A 06/28/2012   Procedure: PERCUTANEOUS CORONARY STENT INTERVENTION (PCI-S);  Surgeon: Sherren Mocha, MD;  Location: [facility] C[facility] LAB;  Service: Cardiovascular;  Laterality: N/A;    Medications: Prior to Admission medications   Medication Sig Start Date End Date Taking? Authorizing Provider  albuterol (VENTOLIN HFA) 108 (90 Base) MCG/ACT inhaler Inhale 2 puffs into the lungs every 6 (six) hours as needed for wheezing or shortness of breath. 03/12/20  Yes [name], MD  atorvastatin (LIPITOR) 40 MG tablet TAKE 1 TABLET BY MOUTH DAILY AT 6 PM Patient taking differently: Take 40 mg by mouth daily. 09/30/20  Yes [name], MD  gabapentin (NEURONTIN) 100 MG capsule TAKE 1 CAPSULE(100 MG) BY MOUTH TWICE DAILY Patient taking differently: Take 100 mg by mouth 2 (two) times daily. 08/03/20  Yes [name], MD  glimepiride (AMARYL) 2 MG tablet Take 2 mg by mouth daily. 07/13/20  Yes [provider]  metFORMIN (GLUCOPHAGE) 850 MG tablet TAKE 1 TABLET(850 MG) BY MOUTH TWICE DAILY WITH A MEAL Patient taking differently: Take 850 mg by mouth 2 (two) times daily with a meal. 06/16/20  Yes [name], MD  nitroGLYCERIN (NITROSTAT) 0.4 MG SL tablet Place 1 tablet (0.4 mg total) under the tongue every 5 (five) minutes as needed for chest pain. 06/08/16  Yes Nahser, Wonda Cheng, MD  pantoprazole (PROTONIX) 40 MG tablet Take 1 tablet (40 mg  total) by mouth 2 (two) times daily. 09/12/20  Yes Pokhrel, Laxman, MD  potassium chloride (KLOR-CON) 20 MEQ tablet Take 1 tablet (20 mEq total) by mouth daily. 08/18/20  Yes Danford, Suann Larry, MD  Psyllium (METAMUCIL PO) Take 15 mLs by mouth 3 (three) times daily with meals. Mix with 4 oz of juice or water   Yes [provider]  spironolactone (ALDACTONE) 25 MG tablet Take 1 tablet (25 mg total) by mouth daily. 08/18/20  Yes Danford, Suann Larry, MD  torsemide (DEMADEX) 20 MG tablet Take 2 tablets (  40 mg total) by mouth 2 (two) times daily. 08/18/20  Yes Danford, Suann Larry, MD  umeclidinium-vilanterol (ANORO ELLIPTA) 62.5-25 MCG/INH AEPB Inhale 1 puff into the lungs daily. 03/12/20  Yes [name], MD  Accu-Chek Softclix Lancets lancets USE AS DIRECTED TO TEST BLOOD SUGAR FOUR TIMES DAILY 03/13/20   [name], MD  blood glucose meter kit and supplies Dispense based on patient and insurance preference. Use up to four times daily as directed. (FOR ICD-10 E10.9, E11.9). 03/12/20   [name], MD    Allergies:   Allergies  Allergen Reactions  . Other     Pt states "I am not taking anymore flu shots. It liked to have killed me."    Social History:  reports that he quit smoking about 24 years ago. His smoking use included cigarettes. He has a 80.00 pack-year smoking history. He has never used smokeless tobacco. He reports current alcohol use of about 8.0 standard drinks of alcohol per week. He reports that he does not use drugs.  Family History: Family History  Problem Relation Age of Onset  . Lung cancer Sister   . Cancer Sister        lung  . Cancer Mother   . Cancer Father        died in his 78s.  . Cancer Brother        lung  . Coronary artery disease Other   . Diabetes Other   . Colon cancer Other   . Cancer Sister        lung     Objective   Physical Exam: Blood pressure (!) 143/61, pulse 81, temperature 98.4 F (36.9 C),  temperature source Oral, resp. rate 18, SpO2 100 %.  Physical Exam Vitals and nursing note reviewed.  Constitutional:      Appearance: He is obese. He is ill-appearing.  HENT:     Head: Normocephalic and atraumatic.  Eyes:     Conjunctiva/sclera: Conjunctivae normal.  Cardiovascular:     Rate and Rhythm: Normal rate and regular rhythm.  Pulmonary:     Effort: Pulmonary effort is normal.     Breath sounds: Examination of the left-middle field reveals rales. Examination of the right-lower field reveals rales. Examination of the left-lower field reveals rales. Rales present.  Abdominal:     General: Abdomen is flat.     Palpations: Abdomen is soft.  Musculoskeletal:     Right lower leg: Edema present.     Left lower leg: Edema present.  Skin:    Coloration: Skin is not jaundiced or pale.  Neurological:     Mental Status: He is alert. Mental status is at baseline.  Psychiatric:        Mood and Affect: Mood normal.        Behavior: Behavior normal.     LABS on Admission: I have personally reviewed all the labs and imaging below    Basic Metabolic Panel: Recent Labs  Lab 11/18/20 1158  NA 133*  K 4.2  CL 102  CO2 20*  GLUCOSE 106*  BUN 42*  CREATININE 1.41*  CALCIUM 8.7*   Liver Function Tests: Recent Labs  Lab 11/18/20 1158  AST 27  ALT 22  ALKPHOS 166*  BILITOT 1.4*  PROT 7.2  ALBUMIN 3.6   No results for input(s): LIPASE, AMYLASE in the last 168 hours. No results for input(s): AMMONIA in the last 168 hours. CBC: Recent Labs  Lab 11/18/20 1158  WBC 4.6  HGB 4.4*  HCT 16.5*  MCV 71.7*  PLT 155   Cardiac Enzymes: No results for input(s): CKTOTAL, CKMB, CKMBINDEX, TROPONINI in the last 168 hours. BNP: Invalid input(s): POCBNP CBG: No results for input(s): GLUCAP in the last 168 hours.  Radiological Exams on Admission:  DG Chest Portable 1 View  Result Date: 11/18/2020 CLINICAL DATA:  Weakness EXAM: PORTABLE CHEST 1 VIEW COMPARISON:  10/09/2020  FINDINGS: The lungs are symmetrically well expanded. Bilateral perihilar and lower lung zone interstitial infiltrate is present most in keeping with mild cardiogenic failure, stable since prior examination. No pneumothorax or pleural effusion. Coronary artery bypass grafting has been performed. Cardiac size is mildly enlarged. No acute bone abnormality. IMPRESSION: Mild cardiogenic failure. Electronically Signed   By: Fidela Salisbury MD   On: 11/18/2020 14:45      EKG: Right axis, sinus rhythm, regular rate   A & P   Principal Problem:   Symptomatic anemia Active Problems:   Type 2 diabetes with complication (HCC)   Hyperlipidemia with target LDL less than 70   CKD (chronic kidney disease), stage III (HCC)   (HFpEF) heart failure with preserved ejection fraction (HCC)   Alcoholic cirrhosis of liver with ascites ([place])   Hepatocellular carcinoma ([place])   1. Symptomatic anemia, concern for recurrent/intermittent GI bleeding  history of cirrhosis with portal hypertension and esophageal varices a. Hemodynamically stable on room air b. Hb 4.4 with negative FOBT  c. trend Hb d. GI consulted: Continue blood transfusion, no need for antibiotics or octreotide as he is not actively bleeding given heme-negative stool.  Plan for endoscopy, colonoscopy and capsule endoscopy on Friday (colon prep tomorrow).  Check AFP level to follow-up on his HCC  2. Volume overload a. CXR with mild cardiogenic failure b. Lower extremity edema and rales on exam and getting blood transfusions c. will give Lasix 60 mg IV x1 now and consider further diuresis in a.m.  3. [place] s/p treatment November 2021 a. Per GI  4. Chronic diastolic heart failure a. May be developing CHF exacerbation b. Diuresis as above c. Monitor daily weights and intake/output d. Holding home torsemide  5. Hypertension a. Continue home spironolactone  6. Hyperlipidemia a. Continue statin  7. Type 2 diabetes a. Monitor for now while  on limited diet   DVT prophylaxis: SCDs   Code Status: Full Code  Diet: CLD, n.p.o. after midnight Family Communication: Admission, patients condition and plan of care including tests being ordered have been discussed with the patient who indicates understanding and agrees with the plan and Code Status.  Disposition Plan: The appropriate patient status for this patient is INPATIENT. Inpatient status is judged to be reasonable and necessary in order to provide the required intensity of service to ensure the patient's safety. The patient's presenting symptoms, physical exam findings, and initial radiographic and laboratory data in the context of their chronic comorbidities is felt to place them at high risk for further clinical deterioration. Furthermore, it is not anticipated that the patient will be medically stable for discharge from the hospital within 2 midnights of admission. The following factors support the patient status of inpatient.   " The patient's presenting symptoms include shortness of breath. " The worrisome physical exam findings include rales, lower extremity edema. " The initial radiographic and laboratory data are worrisome because of anemia. " The chronic co-morbidities include cirrhosis, GI bleeding, diastolic heart failure.   * I certify that at the point of admission it is my clinical judgment  that the patient will require inpatient hospital care spanning beyond 2 midnights from the point of admission due to high intensity of service, high risk for further deterioration and high frequency of surveillance required.*    Consultants  . GI  Procedures  . PRBC transfusion  Time Spent on Admission: 67 minutes    Harold Hedge, DO [facility]  11/18/2020, 4:58 PM

## 2020-11-18 NOTE — ED Provider Notes (Signed)
[facility] DEPT Provider Note   CSN: [id number] Arrival date & time: 11/18/20  1121     History Chief Complaint  Patient presents with  . Weakness    [name] is a 75 y.o. male with a hx of cirrhosis, suspected hepatocellular carcinoma previously, GERD, CHF, T2DM, hyperlipidemia, and prior GI bleeding who presents to the ED from GI office for evaluation of generalized weakness x 1 week. Patient reports fatigue & generalized weakness, worse with activity, has some dyspnea at times as well. No other alleviating/aggravating factors. Feels like prior issues with anemia. Denies chest pain, syncope, dizziness, nausea, or vomiting. Reports intermittent dark stools- most recent was not dark. Denies current alcohol, nsaid, or anticoagulation use.    HPI     Past Medical History:  Diagnosis Date  . Arthritis    "joints tighten up sometimes" (03/27/2104)  . Carcinoma of liver, hepatocellular ([place]) 06/30/2020  . CHF (congestive heart failure) ([place])   . Chronic lower back pain   . Coronary artery disease    a. 05/2012 Cath/PCI: LM 40ost, 50-60d, LAD 99p ruptured plaque (3.0x28 DES), LCX 50p/m, RCA 30-40p, 11m EF 65-70%.  . Coronary artery disease involving native coronary artery of native heart without angina pectoris    Severe left main disease at catheterization July 2015  CABG x3 with a LIMA to the LAD, SVG to the OM, SVG to the PDA on 03/31/14. EF 60% by cath.   . Diastolic heart failure ([facility]   . GERD without esophagitis 08/04/2010  . Hepatic cirrhosis ([place]    a. Dx 01/2014 - CT a/p   . History of blood transfusion    "related to bleeding ulcers"  . History of concussion    1976--  NO RESIDUAL  . History of GI bleed    a. UGIB 07/2012;  b. 01/2014 admission with GIB/FOB stool req 1U prbc's->EGD showed portal gastropathy, barrett's esoph, and chronic active h. pylori gastritis.  .Marland KitchenHistory of gout    2007 &  2008  LEFT LEG-- NO ISSUE SINCE  .  Hyperlipidemia   . Iron deficiency anemia   . Kidney stones   . OA (osteoarthritis of spine)    LOWER BACK--  INTERMITTANT LEFT LEG NUMBNESS  . OSA (obstructive sleep apnea)    PULMOLOGIST-  DR CLANCE--  MODERATE OSA  STARTED CPAP 2012--  BUT CURRENTLY HAS NOT USED PAST 6 MONTHS  . Phimosis    a. s/p circumcision 2015.  . Type 2 diabetes mellitus ([facility]   . Unspecified essential hypertension     Patient Active Problem List   Diagnosis Date Noted  . GI bleeding 09/10/2020  . CKD (chronic kidney disease), stage II   . Hepatocellular carcinoma ([place] 08/09/2020  . Routine general medical examination at a health care facility 05/06/2020  . Iron deficiency anemia   . Symptomatic anemia 04/15/2020  . Pulmonary hypertension, unspecified ([facility] 03/11/2020  . Asthma, mild intermittent 12/27/2019  . Alcoholic cirrhosis of liver with ascites ([facility] 12/27/2019  . (HFpEF) heart failure with preserved ejection fraction ([place] 12/24/2018  . CKD (chronic kidney disease), stage III ([facility] 12/18/2017  . Peptic ulcer disease 12/18/2017  . GI bleed 09/29/2017  . Morbid obesity ([facility] 02/05/2016  . S/P CABG x 3 03/31/2014  . Hepatic cirrhosis ([place] 02/27/2014  . Melena 08/14/2012  . Hyperlipidemia with target LDL less than 70 06/29/2012  . Thrombocytopenia ([facility] 06/29/2012  . Coronary artery disease involving native coronary artery of native heart  without angina pectoris   . OSA (obstructive sleep apnea)   . Type 2 diabetes with complication ([place]) 74/03/1447  . GERD without esophagitis 08/04/2010  . History of gout   . Hypertensive heart disease     Past Surgical History:  Procedure Laterality Date  . ANKLE FRACTURE SURGERY Right 1989   "plate put in"  . APPENDECTOMY  05-16-2004   open  . CIRCUMCISION N/A 09/09/2013   Procedure: CIRCUMCISION ADULT;  Surgeon: Bernestine Amass, MD;  Location: [facility];  Service: Urology;  Laterality: N/A;  . COLECTOMY  05-16-2004  . CORONARY  ANGIOPLASTY WITH STENT PLACEMENT  06/28/2012  DR COOPER   PCI W/  X1 DES to [place]. LAD/  LM  40% OSTIAL & 50-60% DISTAL /  50% PROX LCX/  30-40% PROX RCA & 50% MID RCA/   LVEF 65-70%  . CORONARY ARTERY BYPASS GRAFT N/A 03/31/2014   Procedure: CORONARY ARTERY BYPASS GRAFTING (CABG) times 3 using left internal mammary artery and right saphenous vein.;  Surgeon: Melrose Nakayama, MD;  Location: [facility];  Service: Open Heart Surgery;  Laterality: N/A;  . DOBUTAMINE STRESS ECHO  06-08-2012   MODERATE HYPOKINESIS/ ISCHEMIA MID INFERIOR WALL  . ESOPHAGOGASTRODUODENOSCOPY  08/15/2012   Procedure: ESOPHAGOGASTRODUODENOSCOPY (EGD);  Surgeon: Wonda Horner, MD;  Location[facility] ENDOSCOPY;  Service: Endoscopy;  Laterality: N/A;  . ESOPHAGOGASTRODUODENOSCOPY N/A 02/17/2014   Procedure: ESOPHAGOGASTRODUODENOSCOPY (EGD);  Surgeon: Jeryl Columbia, MD;  Location[facility] ENDOSCOPY;  Service: Endoscopy;  Laterality: N/A;  . ESOPHAGOGASTRODUODENOSCOPY N/A 04/17/2020   Procedure: ESOPHAGOGASTRODUODENOSCOPY (EGD);  Surgeon: Ronnette Juniper, MD;  Location: [place];  Service: Gastroenterology;  Laterality: N/A;  . ESOPHAGOGASTRODUODENOSCOPY N/A 09/11/2020   Procedure: ESOPHAGOGASTRODUODENOSCOPY (EGD);  Surgeon: Clarene Essex, MD;  Location: [facility];  Service: Endoscopy;  Laterality: N/A;  . ESOPHAGOGASTRODUODENOSCOPY  10/09/2020  . ESOPHAGOGASTRODUODENOSCOPY N/A 10/09/2020   Procedure: ESOPHAGOGASTRODUODENOSCOPY (EGD);  Surgeon: Otis Brace, MD;  Location: [facility] ENDOSCOPY;  Service: Gastroenterology;  Laterality: N/A;  . ESOPHAGOGASTRODUODENOSCOPY (EGD) WITH PROPOFOL N/A 09/30/2017   Procedure: ESOPHAGOGASTRODUODENOSCOPY (EGD) WITH PROPOFOL;  Surgeon: Wonda Horner, MD;  Location: [facility] ENDOSCOPY;  Service: Endoscopy;  Laterality: N/A;  . ESOPHAGOGASTRODUODENOSCOPY (EGD) WITH PROPOFOL N/A 12/20/2017   Procedure: ESOPHAGOGASTRODUODENOSCOPY (EGD) WITH PROPOFOL;  Surgeon: Clarene Essex, MD;  Location: [place];  Service: Endoscopy;   Laterality: N/A;  . ESOPHAGOGASTRODUODENOSCOPY (EGD) WITH PROPOFOL N/A 08/10/2020   Procedure: ESOPHAGOGASTRODUODENOSCOPY (EGD) WITH PROPOFOL;  Surgeon: Wilford Corner, MD;  Location: [place];  Service: Endoscopy;  Laterality: N/A;  . HERNIA REPAIR    . INTRAOPERATIVE TRANSESOPHAGEAL ECHOCARDIOGRAM N/A 03/31/2014   Procedure: INTRAOPERATIVE TRANSESOPHAGEAL ECHOCARDIOGRAM;  Surgeon: Melrose Nakayama, MD;  Location: [facility];  Service: Open Heart Surgery;  Laterality: N/A;  . IR ANGIOGRAM SELECTIVE EACH ADDITIONAL VESSEL  06/16/2020  . IR ANGIOGRAM SELECTIVE EACH ADDITIONAL VESSEL  06/16/2020  . IR ANGIOGRAM SELECTIVE EACH ADDITIONAL VESSEL  06/16/2020  . IR ANGIOGRAM SELECTIVE EACH ADDITIONAL VESSEL  06/16/2020  . IR ANGIOGRAM SELECTIVE EACH ADDITIONAL VESSEL  06/16/2020  . IR ANGIOGRAM SELECTIVE EACH ADDITIONAL VESSEL  06/16/2020  . IR ANGIOGRAM SELECTIVE EACH ADDITIONAL VESSEL  06/16/2020  . IR ANGIOGRAM SELECTIVE EACH ADDITIONAL VESSEL  06/16/2020  . IR ANGIOGRAM SELECTIVE EACH ADDITIONAL VESSEL  06/16/2020  . IR ANGIOGRAM SELECTIVE EACH ADDITIONAL VESSEL  06/30/2020  . IR ANGIOGRAM SELECTIVE EACH ADDITIONAL VESSEL  06/30/2020  . IR ANGIOGRAM VISCERAL SELECTIVE  06/16/2020  . IR ANGIOGRAM VISCERAL SELECTIVE  06/30/2020  . IR EMBO  ARTERIAL NOT HEMORR HEMANG INC GUIDE ROADMAPPING  06/16/2020  . IR EMBO TUMOR ORGAN ISCHEMIA INFARCT INC GUIDE ROADMAPPING  06/30/2020  . IR RADIOLOGIST EVAL & MGMT  05/06/2020  . IR RADIOLOGIST EVAL & MGMT  05/20/2020  . IR US GUIDE VASC ACCESS RIGHT  06/16/2020  . IR US GUIDE VASC ACCESS RIGHT  06/30/2020  . LAPAROSCOPIC UMBILICAL HERNIA REPAIR W/ MESH  06-06-2011  . LEFT HEART CATHETERIZATION WITH CORONARY ANGIOGRAM N/A 03/28/2014   Procedure: LEFT HEART CATHETERIZATION WITH CORONARY ANGIOGRAM;  Surgeon: Sinclair Grooms, MD;  Location: [facility] CATH LAB;  Service: Cardiovascular;  Laterality: N/A;  . LIPOMA EXCISION Left 08/25/2017   Procedure: EXCISION LIPOMA  LEFT POSTERIOR THIGH;  Surgeon: Clovis Riley, MD;  Location: [facility];  Service: General;  Laterality: Left;  . NEPHROLITHOTOMY  1990'S  . OPEN APPENDECTOMY W/ PARTIAL CECECTOMY  05-16-2004  . PERCUTANEOUS CORONARY STENT INTERVENTION (PCI-S) N/A 06/28/2012   Procedure: PERCUTANEOUS CORONARY STENT INTERVENTION (PCI-S);  Surgeon: Sherren Mocha, MD;  Location[facility] CATH LAB;  Service: Cardiovascular;  Laterality: N/A;       Family History  Problem Relation Age of Onset  . Lung cancer Sister   . Cancer Sister        lung  . Cancer Mother   . Cancer Father        died in his 88s.  . Cancer Brother        lung  . Coronary artery disease Other   . Diabetes Other   . Colon cancer Other   . Cancer Sister        lung    Social History   Tobacco Use  . Smoking status: Former Smoker    Packs/day: 2.00    Years: 40.00    Pack years: 80.00    Types: Cigarettes    Quit date: 08/29/1996    Years since quitting: 24.2  . Smokeless tobacco: Never Used  Vaping Use  . Vaping Use: Never used  Substance Use Topics  . Alcohol use: Yes    Alcohol/week: 8.0 standard drinks    Types: 8 Cans of beer per week    Comment: 2 beers every other day  . Drug use: No    Home Medications Prior to Admission medications   Medication Sig Start Date End Date Taking? Authorizing Provider  Accu-Chek Softclix Lancets lancets USE AS DIRECTED TO TEST BLOOD SUGAR FOUR TIMES DAILY 03/13/20   [name], MD  albuterol (VENTOLIN HFA) 108 (90 Base) MCG/ACT inhaler Inhale 2 puffs into the lungs every 6 (six) hours as needed for wheezing or shortness of breath. 03/12/20   [name], MD  atorvastatin (LIPITOR) 40 MG tablet TAKE 1 TABLET BY MOUTH DAILY AT 6 PM Patient taking differently: Take 40 mg by mouth daily. 09/30/20   [name], MD  blood glucose meter kit and supplies Dispense based on patient and insurance preference. Use up to four times daily as directed. (FOR ICD-10 E10.9,  E11.9). 03/12/20   [name], MD  gabapentin (NEURONTIN) 100 MG capsule TAKE 1 CAPSULE(100 MG) BY MOUTH TWICE DAILY Patient taking differently: Take 100 mg by mouth 2 (two) times daily. 08/03/20   [name], MD  glimepiride (AMARYL) 2 MG tablet Take 2 mg by mouth daily. 07/13/20   [provider]  metFORMIN (GLUCOPHAGE) 850 MG tablet TAKE 1 TABLET(850 MG) BY MOUTH TWICE DAILY WITH A MEAL Patient taking differently: Take 850 mg by mouth  2 (two) times daily with a meal. 06/16/20   [name], MD  nitroGLYCERIN (NITROSTAT) 0.4 MG SL tablet Place 1 tablet (0.4 mg total) under the tongue every 5 (five) minutes as needed for chest pain. 06/08/16   Nahser, Wonda Cheng, MD  pantoprazole (PROTONIX) 40 MG tablet Take 1 tablet (40 mg total) by mouth 2 (two) times daily. 09/12/20   Pokhrel, Corrie Mckusick, MD  potassium chloride (KLOR-CON) 20 MEQ tablet Take 1 tablet (20 mEq total) by mouth daily. 08/18/20   Danford, Suann Larry, MD  Psyllium (METAMUCIL PO) Take 15 mLs by mouth 3 (three) times daily with meals.    [provider]  spironolactone (ALDACTONE) 25 MG tablet Take 1 tablet (25 mg total) by mouth daily. 08/18/20   Danford, Suann Larry, MD  torsemide (DEMADEX) 20 MG tablet Take 2 tablets (40 mg total) by mouth 2 (two) times daily. 08/18/20   Danford, Suann Larry, MD  umeclidinium-vilanterol (ANORO ELLIPTA) 62.5-25 MCG/INH AEPB Inhale 1 puff into the lungs daily. 03/12/20   [name], MD    Allergies    Patient has no known allergies.  Review of Systems   Review of Systems  Constitutional: Positive for fatigue. Negative for chills and fever.  Respiratory: Positive for shortness of breath.   Cardiovascular: Negative for chest pain.  Gastrointestinal: Positive for blood in stool (dark at times, none today). Negative for abdominal pain, diarrhea, nausea and vomiting.  Neurological: Positive for weakness. Negative for dizziness and syncope.   All other systems reviewed and are negative.   Physical Exam Updated Vital Signs BP (!) 149/45 (BP Location: Left Arm)   Pulse 80   Temp 98.7 F (37.1 C) (Oral)   Resp 18   SpO2 100%   Physical Exam Vitals and nursing note reviewed.  Constitutional:      General: He is not in acute distress.    Appearance: He is well-developed. He is not toxic-appearing.  HENT:     Head: Normocephalic and atraumatic.  Eyes:     General:        Right eye: No discharge.        Left eye: No discharge.     Comments: Conjunctival pallor.   Cardiovascular:     Rate and Rhythm: Normal rate and regular rhythm.  Pulmonary:     Effort: Pulmonary effort is normal. No respiratory distress.     Breath sounds: Normal breath sounds. No wheezing, rhonchi or rales.  Abdominal:     General: There is no distension.     Palpations: Abdomen is soft.     Tenderness: There is no guarding or rebound.  Genitourinary:    Comments: Chaperone present.  DRE with soft brown stool @ this time.  Musculoskeletal:     Cervical back: Neck supple.     Comments: Bilateral LE edema that appears fairly symmetric.   Skin:    General: Skin is warm and dry.     Findings: No rash.  Neurological:     Mental Status: He is alert.     Comments: Clear speech.   Psychiatric:        Behavior: Behavior normal.     ED Results / Procedures / Treatments   Labs (all labs ordered are listed, but only abnormal results are displayed) Labs Reviewed  COMPREHENSIVE METABOLIC PANEL - Abnormal; Notable for the following components:      Result Value   Sodium 133 (*)    CO2 20 (*)  Glucose, Bld 106 (*)    BUN 42 (*)    Creatinine, Ser 1.41 (*)    Calcium 8.7 (*)    Alkaline Phosphatase 166 (*)    Total Bilirubin 1.4 (*)    GFR, Estimated 52 (*)    All other components within normal limits  CBC - Abnormal; Notable for the following components:   RBC 2.30 (*)    Hemoglobin 4.4 (*)    HCT 16.5 (*)    MCV 71.7 (*)    MCH 19.1  (*)    MCHC 26.7 (*)    RDW 20.7 (*)    All other components within normal limits  URINALYSIS, ROUTINE W REFLEX MICROSCOPIC    EKG EKG Interpretation  Date/Time:  Wednesday November 18 2020 13:40:51 EDT Ventricular Rate:  79 PR Interval:    QRS Duration: 106 QT Interval:  368 QTC Calculation: 422 R Axis:   121 Text Interpretation: Sinus rhythm Right axis deviation Nonspecific T abnormalities, lateral leads Confirmed by Aletta Edouard [phone]) on 11/18/2020 1:43:37 PM   Radiology DG Chest Portable 1 View  Result Date: 11/18/2020 CLINICAL DATA:  Weakness EXAM: PORTABLE CHEST 1 VIEW COMPARISON:  10/09/2020 FINDINGS: The lungs are symmetrically well expanded. Bilateral perihilar and lower lung zone interstitial infiltrate is present most in keeping with mild cardiogenic failure, stable since prior examination. No pneumothorax or pleural effusion. Coronary artery bypass grafting has been performed. Cardiac size is mildly enlarged. No acute bone abnormality. IMPRESSION: Mild cardiogenic failure. Electronically Signed   By: Fidela Salisbury MD   On: 11/18/2020 14:45    Procedures .Critical Care Performed by: Amaryllis Dyke, PA-C Authorized by: Amaryllis Dyke, PA-C    CRITICAL CARE Performed by: Kennith Maes   Total critical care time: 35 minutes  Critical care time was exclusive of separately billable procedures and treating other patients.  Critical care was necessary to treat or prevent imminent or life-threatening deterioration.  Critical care was time spent personally by me on the following activities: development of treatment plan with patient and/or surrogate as well as nursing, discussions with consultants, evaluation of patient's response to treatment, examination of patient, obtaining history from patient or surrogate, ordering and performing treatments and interventions, ordering and review of laboratory studies, ordering and review of radiographic  studies, pulse oximetry and re-evaluation of patient's condition.    Medications Ordered in ED Medications  0.9 %  sodium chloride infusion (10 mL/hr Intravenous New Bag/Given 11/18/20 1457)  pantoprazole (PROTONIX) injection 40 mg (40 mg Intravenous Given 11/18/20 1425)    ED Course  I have reviewed the triage vital signs and the nursing notes.  Pertinent labs & imaging results that were available during my care of the patient were reviewed by me and considered in my medical decision making (see chart for details).  Clinical Course as of 11/18/20 1338  Wed Mar 23, 325  [id number] 75 year old male with history of GI bleeds requiring transfusion in the past here for weakness.  Hemoglobin low at 4.4.  Will need admission and transfusion. [MB]    Clinical Course User Index [MB] Hayden Rasmussen, MD   MDM Rules/Calculators/A&P                         Patient presents to the ED with complaints of fatigue/weakness x 1 week. Nontoxic, vitals w/ somewhat elevated BP. Without peritoneal signs on abdominal exam, DRE with soft brown stool at this time- fecal occult negative.  Additional history obtained:  Additional history obtained from chart review & nursing note review.   Recent admission for upper GI bleed 02/10 through 10/10/2020, required transfusion of multiple units of PRBCs at that time, received IV Protonix as well as octreotide drips with IV ceftriaxone-he underwent EGD:  Upper endoscopy 10/09/20:  - Grade I esophageal varices. - Type 1 isolated gastric varices (IGV1, varices located in the fundus), without bleeding. - Portal hypertensive gastropathy. - Normal duodenal bulb, first portion of the duodenum and second portion of the duodenum. - No specimens collected.  Discussed colonoscopy at last admission however patient declined.   Lab Tests:  I Ordered, reviewed, and interpreted labs, which included:  CBC: Anemia with a hemoglobin of 4.4 and hematocrit of 16.5.  White blood cell  and platelet counts are within normal limits CMP: Mild worsening renal function mild improvement in total bilirubin.  Imaging Studies ordered:  I ordered imaging studies which included CXR, I independently reviewed, formal radiology impression shows: Mild cardiogenic failure.  ED Course:  Patient with recurrence of anemia, likely secondary to recurrent GI bleeding, no active bleeding at this time, fecal occult negative, no melena.  1 dose of IV Protonix given and 3 units of blood ordered.  Will discuss with gastroenterology.  13:38: CONSULT: Discussed with gastroenterologist Dr. Cristina Gong- will see patient this afternoon, can hold off on octreotide/rocephin with fecal occult negative stool and no melena currently, admit to hospitalist service. Appreciate consultation.   15:00: CONSULT: Discussed with hospitalist Dr. Neysa Bonito- accepts admission.   Findings and plan of care discussed with supervising physician Dr. Melina Copa who is in agreement.   Portions of this note were generated with Lobbyist. Dictation errors may occur despite best attempts at proofreading.  Final Clinical Impression(s) / ED Diagnoses Final diagnoses:  Symptomatic anemia    Rx / DC Orders ED Discharge Orders    None       Amaryllis Dyke, PA-C 11/18/20 1502    Hayden Rasmussen, MD 11/18/20 1800

## 2020-11-18 NOTE — ED Notes (Signed)
Blue and Gold top tubes sent to lab.

## 2020-11-18 NOTE — Consult Note (Signed)
Referring Provider: Marva Panda MD Primary Care Physician:  [name], MD Primary Gastroenterologist:  Dr. [name]  Reason for Consultation:  Symptomatic anemia, Cirrhosis  HPI: [name] is a 75 y.o. male  75 year old male with history of CHF, CAD, DM2, cirrhosis with portal hypertension, [place] s/p treatment 06/2020, and multiple histories of GI bleed requiring transfusion presents to the ER from [place] office due to symptomatic anemia.   Most recent hospitalization was from 2/10-2/12/22 for anemia (Hgb 4.9 on presentation).  EGD 10/09/20 showed grade 1 esophageal varices, isolated type 1 gastric varices without bleeding, as well as portal hypertensive gastropathy.  Colonoscopy was recommended but patient declined and stated he wanted to complete colonoscopy as an outpatient .Last colonoscopy in 2014 was pertinent for 2 colonic AVMs and hyperplastic polyps.  Hgb 7.2 as of 10/10/20.  He was given 1u pRBCs prior to discharge. Patient was on discharged on protonix twice daily but patient states he ran out of this prescription.  He was seen in the office on 11/18/20 for hospital follow up but had worsening shortness of breath and dizziness, so referred to ER for evaluation.   Most recent H/H was 4.4/16.5. His INR this visit was stable at 1.2.   Lab Results  Component Value Date   INR 1.2 11/18/2020   INR 1.2 10/09/2020   INR 1.1 09/10/2020   Lab Results  Component Value Date   WBC 4.6 11/18/2020   HGB 4.4 (LL) 11/18/2020   HCT 16.5 (L) 11/18/2020   MCV 71.7 (L) 11/18/2020   PLT 155 11/18/2020      Past Medical History:  Diagnosis Date  . Arthritis    "joints tighten up sometimes" (03/27/2104)  . Carcinoma of liver, hepatocellular ([place]) 06/30/2020  . CHF (congestive heart failure) ([place])   . Chronic lower back pain   . Coronary artery disease    a. 05/2012 Cath/PCI: LM 40ost, 50-60d, LAD 99p ruptured plaque (3.0x28 DES), LCX 50p/m, RCA 30-40p, 16m EF 65-70%.  .  Coronary artery disease involving native coronary artery of native heart without angina pectoris    Severe left main disease at catheterization July 2015  CABG x3 with a LIMA to the LAD, SVG to the OM, SVG to the PDA on 03/31/14. EF 60% by cath.   . Diastolic heart failure ([place]   . GERD without esophagitis 08/04/2010  . Hepatic cirrhosis ([place]    a. Dx 01/2014 - CT a/p   . History of blood transfusion    "related to bleeding ulcers"  . History of concussion    1976--  NO RESIDUAL  . History of GI bleed    a. UGIB 07/2012;  b. 01/2014 admission with GIB/FOB stool req 1U prbc's->EGD showed portal gastropathy, barrett's esoph, and chronic active h. pylori gastritis.  .Marland KitchenHistory of gout    2007 &  2008  LEFT LEG-- NO ISSUE SINCE  . Hyperlipidemia   . Iron deficiency anemia   . Kidney stones   . OA (osteoarthritis of spine)    LOWER BACK--  INTERMITTANT LEFT LEG NUMBNESS  . OSA (obstructive sleep apnea)    PULMOLOGIST-  DR CLANCE--  MODERATE OSA  STARTED CPAP 2012--  BUT CURRENTLY HAS NOT USED PAST 6 MONTHS  . Phimosis    a. s/p circumcision 2015.  . Type 2 diabetes mellitus ([place]   . Unspecified essential hypertension     Past Surgical History:  Procedure Laterality Date  . ANKLE FRACTURE SURGERY Right 1989   "  plate put in"  . APPENDECTOMY  05-16-2004   open  . CIRCUMCISION N/A 09/09/2013   Procedure: CIRCUMCISION ADULT;  Surgeon: Bernestine Amass, MD;  Location: [facility];  Service: Urology;  Laterality: N/A;  . COLECTOMY  05-16-2004  . CORONARY ANGIOPLASTY WITH STENT PLACEMENT  06/28/2012  DR COOPER   PCI W/  X1 DES to [place]. LAD/  LM  40% OSTIAL & 50-60% DISTAL /  50% PROX LCX/  30-40% PROX RCA & 50% MID RCA/   LVEF 65-70%  . CORONARY ARTERY BYPASS GRAFT N/A 03/31/2014   Procedure: CORONARY ARTERY BYPASS GRAFTING (CABG) times 3 using left internal mammary artery and right saphenous vein.;  Surgeon: Melrose Nakayama, MD;  Location: [place];  Service: Open Heart Surgery;   Laterality: N/A;  . DOBUTAMINE STRESS ECHO  06-08-2012   MODERATE HYPOKINESIS/ ISCHEMIA MID INFERIOR WALL  . ESOPHAGOGASTRODUODENOSCOPY  08/15/2012   Procedure: ESOPHAGOGASTRODUODENOSCOPY (EGD);  Surgeon: Wonda Horner, MD;  Location: [facility] ENDOSCOPY;  Service: Endoscopy;  Laterality: N/A;  . ESOPHAGOGASTRODUODENOSCOPY N/A 02/17/2014   Procedure: ESOPHAGOGASTRODUODENOSCOPY (EGD);  Surgeon: Jeryl Columbia, MD;  Location: [facility] ENDOSCOPY;  Service: Endoscopy;  Laterality: N/A;  . ESOPHAGOGASTRODUODENOSCOPY N/A 04/17/2020   Procedure: ESOPHAGOGASTRODUODENOSCOPY (EGD);  Surgeon: Ronnette Juniper, MD;  Location: [place];  Service: Gastroenterology;  Laterality: N/A;  . ESOPHAGOGASTRODUODENOSCOPY N/A 09/11/2020   Procedure: ESOPHAGOGASTRODUODENOSCOPY (EGD);  Surgeon: Clarene Essex, MD;  Location: [facility] ENDOS[facility];  Service: Endoscopy;  Laterality: N/A;  . ESOPHAGOGASTRODUODENOSCOPY  10/09/2020  . ESOPHAGOGASTRODUODENOSCOPY N/A 10/09/2020   Procedure: ESOPHAGOGASTRODUODENOSCOPY (EGD);  Surgeon: Otis Brace, MD;  Location[facility] ENDOSCOPY;  Service: Gastroenterology;  Laterality: N/A;  . ESOPHAGOGASTRODUODENOSCOPY (EGD) WITH PROPOFOL N/A 09/30/2017   Procedure: ESOPHAGOGASTRODUODENOSCOPY (EGD) WITH PROPOFOL;  Surgeon: Wonda Horner, MD;  Location: [facility] ENDOSCOPY;  Service: Endoscopy;  Laterality: N/A;  . ESOPHAGOGASTRODUODENOSCOPY (EGD) WITH PROPOFOL N/A 12/20/2017   Procedure: ESOPHAGOGASTRODUODENOSCOPY (EGD) WITH PROPOFOL;  Surgeon: Clarene Essex, MD;  Location: [place];  Service: Endoscopy;  Laterality: N/A;  . ESOPHAGOGASTRODUODENOSCOPY (EGD) WITH PROPOFOL N/A 08/10/2020   Procedure: ESOPHAGOGASTRODUODENOSCOPY (EGD) WITH PROPOFOL;  Surgeon: Wilford Corner, MD;  Location: [place];  Service: Endoscopy;  Laterality: N/A;  . HERNIA REPAIR    . INTRAOPERATIVE TRANSESOPHAGEAL ECHOCARDIOGRAM N/A 03/31/2014   Procedure: INTRAOPERATIVE TRANSESOPHAGEAL ECHOCARDIOGRAM;  Surgeon: Melrose Nakayama, MD;  Location: [place];  Service: Open Heart Surgery;  Laterality: N/A;  . IR ANGIOGRAM SELECTIVE EACH ADDITIONAL VESSEL  06/16/2020  . IR ANGIOGRAM SELECTIVE EACH ADDITIONAL VESSEL  06/16/2020  . IR ANGIOGRAM SELECTIVE EACH ADDITIONAL VESSEL  06/16/2020  . IR ANGIOGRAM SELECTIVE EACH ADDITIONAL VESSEL  06/16/2020  . IR ANGIOGRAM SELECTIVE EACH ADDITIONAL VESSEL  06/16/2020  . IR ANGIOGRAM SELECTIVE EACH ADDITIONAL VESSEL  06/16/2020  . IR ANGIOGRAM SELECTIVE EACH ADDITIONAL VESSEL  06/16/2020  . IR ANGIOGRAM SELECTIVE EACH ADDITIONAL VESSEL  06/16/2020  . IR ANGIOGRAM SELECTIVE EACH ADDITIONAL VESSEL  06/16/2020  . IR ANGIOGRAM SELECTIVE EACH ADDITIONAL VESSEL  06/30/2020  . IR ANGIOGRAM SELECTIVE EACH ADDITIONAL VESSEL  06/30/2020  . IR ANGIOGRAM VISCERAL SELECTIVE  06/16/2020  . IR ANGIOGRAM VISCERAL SELECTIVE  06/30/2020  . IR EMBO ARTERIAL NOT HEMORR HEMANG INC GUIDE ROADMAPPING  06/16/2020  . IR EMBO TUMOR ORGAN ISCHEMIA INFARCT INC GUIDE ROADMAPPING  06/30/2020  . IR RADIOLOGIST EVAL & MGMT  05/06/2020  . IR RADIOLOGIST EVAL & MGMT  05/20/2020  . IR US GUIDE VASC ACCESS RIGHT  06/16/2020  . IR US GUIDE VASC ACCESS  RIGHT  06/30/2020  . LAPAROSCOPIC UMBILICAL HERNIA REPAIR W/ MESH  06-06-2011  . LEFT HEART CATHETERIZATION WITH CORONARY ANGIOGRAM N/A 03/28/2014   Procedure: LEFT HEART CATHETERIZATION WITH CORONARY ANGIOGRAM;  Surgeon: Sinclair Grooms, MD;  Location[facility] CATH LAB;  Service: Cardiovascular;  Laterality: N/A;  . LIPOMA EXCISION Left 08/25/2017   Procedure: EXCISION LIPOMA LEFT POSTERIOR THIGH;  Surgeon: Clovis Riley, MD;  Location: [place];  Service: General;  Laterality: Left;  . NEPHROLITHOTOMY  1990'S  . OPEN APPENDECTOMY W/ PARTIAL CECECTOMY  05-16-2004  . PERCUTANEOUS CORONARY STENT INTERVENTION (PCI-S) N/A 06/28/2012   Procedure: PERCUTANEOUS CORONARY STENT INTERVENTION (PCI-S);  Surgeon: Sherren Mocha, MD;  Location: [facility] C[facility] LAB;  Service: Cardiovascular;  Laterality: N/A;    Prior to  Admission medications   Medication Sig Start Date End Date Taking? Authorizing Provider  albuterol (VENTOLIN HFA) 108 (90 Base) MCG/ACT inhaler Inhale 2 puffs into the lungs every 6 (six) hours as needed for wheezing or shortness of breath. 03/12/20  Yes [name], MD  atorvastatin (LIPITOR) 40 MG tablet TAKE 1 TABLET BY MOUTH DAILY AT 6 PM Patient taking differently: Take 40 mg by mouth daily. 09/30/20  Yes [name], MD  gabapentin (NEURONTIN) 100 MG capsule TAKE 1 CAPSULE(100 MG) BY MOUTH TWICE DAILY Patient taking differently: Take 100 mg by mouth 2 (two) times daily. 08/03/20  Yes [name], MD  glimepiride (AMARYL) 2 MG tablet Take 2 mg by mouth daily. 07/13/20  Yes [provider]  metFORMIN (GLUCOPHAGE) 850 MG tablet TAKE 1 TABLET(850 MG) BY MOUTH TWICE DAILY WITH A MEAL Patient taking differently: Take 850 mg by mouth 2 (two) times daily with a meal. 06/16/20  Yes [name], MD  nitroGLYCERIN (NITROSTAT) 0.4 MG SL tablet Place 1 tablet (0.4 mg total) under the tongue every 5 (five) minutes as needed for chest pain. 06/08/16  Yes Nahser, Wonda Cheng, MD  pantoprazole (PROTONIX) 40 MG tablet Take 1 tablet (40 mg total) by mouth 2 (two) times daily. 09/12/20  Yes Pokhrel, Laxman, MD  potassium chloride (KLOR-CON) 20 MEQ tablet Take 1 tablet (20 mEq total) by mouth daily. 08/18/20  Yes Danford, Suann Larry, MD  Psyllium (METAMUCIL PO) Take 15 mLs by mouth 3 (three) times daily with meals. Mix with 4 oz of juice or water   Yes [provider]  spironolactone (ALDACTONE) 25 MG tablet Take 1 tablet (25 mg total) by mouth daily. 08/18/20  Yes Danford, Suann Larry, MD  torsemide (DEMADEX) 20 MG tablet Take 2 tablets (40 mg total) by mouth 2 (two) times daily. 08/18/20  Yes Danford, Suann Larry, MD  umeclidinium-vilanterol (ANORO ELLIPTA) 62.5-25 MCG/INH AEPB Inhale 1 puff into the lungs daily. 03/12/20  Yes [name], MD   Accu-Chek Softclix Lancets lancets USE AS DIRECTED TO TEST BLOOD SUGAR FOUR TIMES DAILY 03/13/20   [name], MD  blood glucose meter kit and supplies Dispense based on patient and insurance preference. Use up to four times daily as directed. (FOR ICD-10 E10.9, E11.9). 03/12/20   [name], MD    Scheduled Meds: Continuous Infusions: . sodium chloride     PRN Meds:.  Allergies as of 11/18/2020  . (No Known Allergies)    Family History  Problem Relation Age of Onset  . Lung cancer Sister   . Cancer Sister        lung  . Cancer Mother   . Cancer Father  died in his 74s.  . Cancer Brother        lung  . Coronary artery disease Other   . Diabetes Other   . Colon cancer Other   . Cancer Sister        lung    Social History   Socioeconomic History  . Marital status: Married    Spouse name: Not on file  . Number of children: Not on file  . Years of education: Not on file  . Highest education level: Not on file  Occupational History  . Not on file  Tobacco Use  . Smoking status: Former Smoker    Packs/day: 2.00    Years: 40.00    Pack years: 80.00    Types: Cigarettes    Quit date: 08/29/1996    Years since quitting: 24.2  . Smokeless tobacco: Never Used  Vaping Use  . Vaping Use: Never used  Substance and Sexual Activity  . Alcohol use: Yes    Alcohol/week: 8.0 standard drinks    Types: 8 Cans of beer per week    Comment: 2 beers every other day  . Drug use: No  . Sexual activity: Yes  Other Topics Concern  . Not on file  Social History Narrative   Lives in [place] with wife, son, and son's family.   Social Determinants of Health   Financial Resource Strain: Not on file  Food Insecurity: Not on file  Transportation Needs: Not on file  Physical Activity: Not on file  Stress: Not on file  Social Connections: Not on file  Intimate Partner Violence: Not on file    Review of Systems: All negative except as stated above in  HPI.  Physical Exam: Vital signs: Vitals:   11/18/20 1350 11/18/20 1415  BP: (!) 122/24 (!) 143/46  Pulse: 76 79  Resp: 14 15  Temp:    SpO2: 100% 100%     Defered Physical exam to Dr. Cristina Gong  GI:  Lab Results: Recent Labs    11/18/20 1158  WBC 4.6  HGB 4.4*  HCT 16.5*  PLT 155   BMET Recent Labs    11/18/20 1158  NA 133*  K 4.2  CL 102  CO2 20*  GLUCOSE 106*  BUN 42*  CREATININE 1.41*  CALCIUM 8.7*   LFT Recent Labs    11/18/20 1158  PROT 7.2  ALBUMIN 3.6  AST 27  ALT 22  ALKPHOS 166*  BILITOT 1.4*   PT/INR Recent Labs    11/18/20 1324  LABPROT 15.1  INR 1.2    Studies/Results: No results found.  Impression/Plan: Anemia Cirrhosis [place]  Refer to Dr. Osborn Coho Addendum   LOS: 0 days   Vladimir Crofts  PA-C 11/18/2020, 2:36 PM  Contact #  [phone]

## 2020-11-18 NOTE — ED Triage Notes (Signed)
Pt arrived via walk in, sent by PCP for question of low hemoglobin. Hx of anemia and need for blood transfusions in the past. Endorses some weakness and SOB.

## 2020-11-19 LAB — CBC WITH DIFFERENTIAL/PLATELET
Abs Immature Granulocytes: 0.01 10*3/uL (ref 0.00–0.07)
Basophils Absolute: 0 10*3/uL (ref 0.0–0.1)
Basophils Relative: 0 %
Eosinophils Absolute: 0 10*3/uL (ref 0.0–0.5)
Eosinophils Relative: 1 %
HCT: 17.2 % — ABNORMAL LOW (ref 39.0–52.0)
Hemoglobin: 4.8 g/dL — CL (ref 13.0–17.0)
Immature Granulocytes: 0 %
Lymphocytes Relative: 11 %
Lymphs Abs: 0.3 10*3/uL — ABNORMAL LOW (ref 0.7–4.0)
MCH: 20.4 pg — ABNORMAL LOW (ref 26.0–34.0)
MCHC: 27.9 g/dL — ABNORMAL LOW (ref 30.0–36.0)
MCV: 73.2 fL — ABNORMAL LOW (ref 80.0–100.0)
Monocytes Absolute: 0.2 10*3/uL (ref 0.1–1.0)
Monocytes Relative: 8 %
Neutro Abs: 2.2 10*3/uL (ref 1.7–7.7)
Neutrophils Relative %: 80 %
Platelets: 118 10*3/uL — ABNORMAL LOW (ref 150–400)
RBC: 2.35 MIL/uL — ABNORMAL LOW (ref 4.22–5.81)
RDW: 21.9 % — ABNORMAL HIGH (ref 11.5–15.5)
WBC: 2.8 10*3/uL — ABNORMAL LOW (ref 4.0–10.5)
nRBC: 0 % (ref 0.0–0.2)

## 2020-11-19 LAB — BASIC METABOLIC PANEL
Anion gap: 9 (ref 5–15)
BUN: 36 mg/dL — ABNORMAL HIGH (ref 8–23)
CO2: 22 mmol/L (ref 22–32)
Calcium: 8.4 mg/dL — ABNORMAL LOW (ref 8.9–10.3)
Chloride: 104 mmol/L (ref 98–111)
Creatinine, Ser: 1.36 mg/dL — ABNORMAL HIGH (ref 0.61–1.24)
GFR, Estimated: 55 mL/min — ABNORMAL LOW (ref >60)
Glucose, Bld: 103 mg/dL — ABNORMAL HIGH (ref 70–99)
Potassium: 4.1 mmol/L (ref 3.5–5.1)
Sodium: 135 mmol/L (ref 135–145)

## 2020-11-19 LAB — SARS CORONAVIRUS 2 (TAT 6-24 HRS): SARS Coronavirus 2: NEGATIVE

## 2020-11-19 LAB — GLUCOSE, CAPILLARY: Glucose-Capillary: 102 mg/dL — ABNORMAL HIGH (ref 70–99)

## 2020-11-19 LAB — PREPARE RBC (CROSSMATCH)

## 2020-11-19 LAB — HEMOGLOBIN AND HEMATOCRIT, BLOOD
HCT: 23.6 % — ABNORMAL LOW (ref 39.0–52.0)
Hemoglobin: 6.9 g/dL — CL (ref 13.0–17.0)

## 2020-11-19 MED ORDER — ACETAMINOPHEN 325 MG PO TABS: 650.0000 mg | ORAL_TABLET | Freq: Once | ORAL | Status: DC

## 2020-11-19 MED ORDER — SODIUM CHLORIDE 0.9% IV SOLUTION: Freq: Once | INTRAVENOUS | Status: AC

## 2020-11-19 MED ORDER — DIPHENHYDRAMINE HCL 25 MG PO CAPS: 25.0000 mg | ORAL_CAPSULE | Freq: Once | ORAL | Status: DC

## 2020-11-19 MED ORDER — FUROSEMIDE 10 MG/ML IJ SOLN
60.0000 mg | Freq: Two times a day (BID) | INTRAMUSCULAR | Status: AC
  Administered 2020-11-19 – 2020-11-22 (×7): 60 mg via INTRAVENOUS
  Filled 2020-11-19 (×7): qty 6

## 2020-11-19 MED ORDER — FUROSEMIDE 10 MG/ML IJ SOLN: 60.0000 mg | Freq: Once | INTRAMUSCULAR | Status: DC

## 2020-11-19 MED ORDER — FUROSEMIDE 10 MG/ML IJ SOLN
20.0000 mg | Freq: Once | INTRAMUSCULAR | Status: DC
  Filled 2020-11-19: qty 2

## 2020-11-19 NOTE — Progress Notes (Signed)
The patient reports a couple of good sized bowel movements following his milk of magnesia last night.  The second of those bowel movements was loose in character.  He has not yet started the gallon of Nuly[place]ely.  From the anemia perspective, his hemoglobin hardly budged after transfusion of 1 unit of packed cells yesterday, and he is receiving at least 1 more unit today.  We will tentatively plan to proceed with endoscopy and video capsule placement, and colonoscopy, tomorrow morning at 1115.  Cleotis Nipper, M.D. Pager [phone] If no answer or after 5 PM call [phone]

## 2020-11-19 NOTE — Progress Notes (Signed)
Notified Dr. Florene Glen of pts repeat hgb&hct results. New orders placed.

## 2020-11-19 NOTE — Progress Notes (Signed)
PROGRESS NOTE    [name]  BMW:[id number] DOB: Dec 26, 1945 DOA: 11/18/2020 PCP: [name], MD   Chief Complaint  Patient presents with  . Weakness   Brief Narrative:  This is a 75 year old male with past medical history of [place] s/p treatment 06/2020, cirrhosis with portal hypertension and esophageal varices, CHF, CAD, type 2 diabetes, hypertension, hyperlipidemia, recurrent GI bleed who presented to the ED from the [place] office for evaluation of shortness of breath and generalized weakness x1 week.  Patient has had fatigue and generalized weakness notable on activity with shortness of breath at times.  Has intermittent dark stools.  No recent alcohol, NSAID or anticoagulant use.  No chest pain, syncope or any other complaints.  He does admit to lower extremity edema and has been taking his medications as prescribed.  He was recently hospitalized from 2/10-2/12 for anemia.  EGD on 2/11 showed grade 1 esophageal varices, gastric varices without bleeding and portal hypertensive gastropathy.  Colonoscopy was recommended but patient declined at that time and wanted to complete the colonoscopy as an outpatient.  Last colonoscopy in 2014 showed colonic AVMs and hyperplastic polyps.  He was discharged on Protonix twice daily but he ran out of his prescription.  He was seen in the GI office today for follow-up but due to his worsening symptoms he was referred to the ED.  Assessment & Plan:   Principal Problem:   Symptomatic anemia Active Problems:   Type 2 diabetes with complication (HCC)   Hyperlipidemia with target LDL less than 70   CKD (chronic kidney disease), stage III (HCC)   (HFpEF) heart failure with preserved ejection fraction (HCC)   Alcoholic cirrhosis of liver with ascites ([place])   Hepatocellular carcinoma ([place])   1. Symptomatic anemia, concern for recurrent/intermittent GI bleeding  history of cirrhosis with portal hypertension and esophageal  varices a. Hemodynamically stable on room air b. Hb 4.8 today, follow after 2 additional units pRBC c. Trend Hb d. GI consulted: Continue blood transfusion, no need for antibiotics or octreotide as he is not actively bleeding given heme-negative stool.  Plan for endoscopy, colonoscopy and capsule endoscopy on Friday (colon prep tomorrow).  Check AFP level to follow-up on his [place]  2. Volume overload  HFpEF a. CXR with mild cardiogenic failure b. CXR 3/25  c. Lower extremity edema and rales on exam and getting blood transfusions d. Continue lasix due to above (on torsemide 40 BID at home) e. Strict I/O, daily weights f. Echo 04/2020 with EF 60-65%, grade III diastolic dysfunction  3. Alcoholic Liver Cirrhosis  HCC s/p treatment November 2021 a. Per GI  5. Hypertension a. Continue home spironolactone  6. Hyperlipidemia a. Continue statin  7. History Asthma 1. Continue albuterol, anoro ellipta  8. Type 2 diabetes a. Monitor for now while on limited diet  DVT prophylaxis: SCD Code Status: full Family Communication: none at bedside Disposition:   Status is: Inpatient  Remains inpatient appropriate because:Inpatient level of care appropriate due to severity of illness   Dispo: The patient is from: Home              Anticipated d/c is to: Home              Patient currently is not medically stable to d/c.   Difficult to place patient No  Consultants:   GI  Procedures:   none  Antimicrobials: Anti-infectives (From admission, onward)   None     Subjective: Feeling a little  better after transfusion  Objective: Vitals:   11/19/20 1026 11/19/20 1119 11/19/20 1134 11/19/20 1357  BP: 138/60 (!) 142/63 (!) 141/75 133/66  Pulse: 70 72 76 69  Resp: 16 16 18 20   Temp: 98 F (36.7 C) 97.9 F (36.6 C) 98 F (36.7 C) 97.8 F (36.6 C)  TempSrc: Oral Oral Oral Oral  SpO2:   98% 100%  Weight:      Height:        Intake/Output Summary (Last 24 hours) at  11/19/2020 1804 Last data filed at 11/19/2020 1700 Gross per 24 hour  Intake 948 ml  Output 2300 ml  Net -1352 ml   Filed Weights   11/18/20 1921 11/19/20 0500  Weight: 108 kg 108 kg    Examination:  General: No acute distress. Cardiovascular: Heart sounds show a regular rate, and rhythm Lungs: Clear to auscultation bilaterally  Abdomen: Soft, nontender, nondistended Neurological: Alert and oriented 3. Moves all extremities 4. Cranial nerves II through XII grossly intact. Skin: Warm and dry. No rashes or lesions. Extremities: bilateral lower extremity edema, 3+    Data Reviewed: I have personally reviewed following labs and imaging studies  CBC: Recent Labs  Lab 11/18/20 1158 11/18/20 2038 11/19/20 0210  WBC 4.6  --  2.8*  NEUTROABS  --   --  2.2  HGB 4.4* 5.4* 4.8*  HCT 16.5* 19.8* 17.2*  MCV 71.7*  --  73.2*  PLT 155  --  118*    Basic Metabolic Panel: Recent Labs  Lab 11/18/20 1158 11/19/20 0210  NA 133* 135  K 4.2 4.1  CL 102 104  CO2 20* 22  GLUCOSE 106* 103*  BUN 42* 36*  CREATININE 1.41* 1.36*  CALCIUM 8.7* 8.4*    GFR: Estimated Creatinine Clearance: 56.8 mL/min (A) (by C-G formula based on SCr of 1.36 mg/dL (H)).  Liver Function Tests: Recent Labs  Lab 11/18/20 1158  AST 27  ALT 22  ALKPHOS 166*  BILITOT 1.4*  PROT 7.2  ALBUMIN 3.6    CBG: Recent Labs  Lab 11/19/20 0102  GLUCAP 102*     Recent Results (from the past 240 hour(s))  Resp Panel by RT-PCR (Flu A&B, Covid) Nasopharyngeal Swab     Status: Abnormal   Collection Time: 11/18/20  2:59 PM   Specimen: Nasopharyngeal Swab; Nasopharyngeal(NP) swabs in vial transport medium  Result Value Ref Range Status   SARS Coronavirus 2 by RT PCR POSITIVE (A) NEGATIVE Final    Comment: RESULT CALLED TO, READ BACK BY AND VERIFIED WITH: S.CLAPP, RN AT 1800 ON 03.23.22 BY N.THOMPSON (NOTE) SARS-CoV-2 target nucleic acids are DETECTED.  The SARS-CoV-2 RNA is generally detectable in  upper respiratory specimens during the acute phase of infection. Positive results are indicative of the presence of the identified virus, but do not rule out bacterial infection or co-infection with other pathogens not detected by the test. Clinical correlation with patient history and other diagnostic information is necessary to determine patient infection status. The expected result is Negative.  Fact Sheet for Patients: EntrepreneurPulse.com.au  Fact Sheet for Healthcare Providers: IncredibleEmployment.be  This test is not yet approved or cleared by the [place] FDA and  has been authorized for detection and/or diagnosis of SARS-CoV-2 by FDA under an Emergency Use Authorization (EUA).  This EUA will remain in effect (meaning this  test can be used) for the duration of  the COVID-19 declaration under Section 564(b)(1) of the Act, 21 U.S.C. section 360bbb-3(b)(1), unless the authorization is  terminated or revoked sooner.     Influenza A by PCR NEGATIVE NEGATIVE Final   Influenza B by PCR NEGATIVE NEGATIVE Final    Comment: (NOTE) The Xpert Xpress SARS-CoV-2/FLU/RSV plus assay is intended as an aid in the diagnosis of influenza from Nasopharyngeal swab specimens and should not be used as a sole basis for treatment. Nasal washings and aspirates are unacceptable for Xpert Xpress SARS-CoV-2/FLU/RSV testing.  Fact Sheet for Patients: EntrepreneurPulse.com.au  Fact Sheet for Healthcare Providers: IncredibleEmployment.be  This test is not yet approved or cleared by the [place] FDA and has been authorized for detection and/or diagnosis of SARS-CoV-2 by FDA under an Emergency Use Authorization (EUA). This EUA will remain in effect (meaning this test can be used) for the duration of the COVID-19 declaration under Section 564(b)(1) of the Act, 21 U.S.C. section 360bbb-3(b)(1), unless the authorization is  terminated or revoked.  Performed at [facility], [place] [address] [place], [place] [address]   SARS CORONAVIRUS 2 (TAT 6-24 HRS) Nasopharyngeal Nasopharyngeal Swab     Status: None   Collection Time: 11/18/20  6:28 PM   Specimen: Nasopharyngeal Swab  Result Value Ref Range Status   SARS Coronavirus 2 NEGATIVE NEGATIVE Final    Comment: (NOTE) SARS-CoV-2 target nucleic acids are NOT DETECTED.  The SARS-CoV-2 RNA is generally detectable in upper and lower respiratory specimens during the acute phase of infection. Negative results do not preclude SARS-CoV-2 infection, do not rule out co-infections with other pathogens, and should not be used as the sole basis for treatment or other patient management decisions. Negative results must be combined with clinical observations, patient history, and epidemiological information. The expected result is Negative.  Fact Sheet for Patients: SugarRoll.be  Fact Sheet for Healthcare Providers: https://www.woods-mathews.com/  This test is not yet approved or cleared by the [place] FDA and  has been authorized for detection and/or diagnosis of SARS-CoV-2 by FDA under an Emergency Use Authorization (EUA). This EUA will remain  in effect (meaning this test can be used) for the duration of the COVID-19 declaration under Se ction 564(b)(1) of the Act, 21 U.S.C. section 360bbb-3(b)(1), unless the authorization is terminated or revoked sooner.  Performed at [facility], [place] [address], [place], [place] [address]          Radiology Studies: DG Chest Portable 1 View  Result Date: 11/18/2020 CLINICAL DATA:  Weakness EXAM: PORTABLE CHEST 1 VIEW COMPARISON:  10/09/2020 FINDINGS: The lungs are symmetrically well expanded. Bilateral perihilar and lower lung zone interstitial infiltrate is present most in keeping with mild cardiogenic failure, stable since prior examination. No  pneumothorax or pleural effusion. Coronary artery bypass grafting has been performed. Cardiac size is mildly enlarged. No acute bone abnormality. IMPRESSION: Mild cardiogenic failure. Electronically Signed   By: Fidela Salisbury MD   On: 11/18/2020 14:45        Scheduled Meds: . acetaminophen  650 mg Oral Once  . atorvastatin  40 mg Oral Daily  . diphenhydrAMINE  25 mg Oral Once  . furosemide  60 mg Intravenous BID  . gabapentin  100 mg Oral BID  . pantoprazole (PROTONIX) IV  40 mg Intravenous Q12H  . sodium chloride flush  3 mL Intravenous Q12H  . spironolactone  25 mg Oral Daily  . umeclidinium-vilanterol  1 puff Inhalation Daily   Continuous Infusions:   LOS: 1 day    Time spent: over 30 min    Fayrene Helper, MD [facility]  To contact the attending provider between 7A-7P or the covering provider during after hours 7P-7A, please log into the web site [email or web address] and access using universal [place] password for that web site. If you do not have the password, please call the hospital operator.  11/19/2020, 6:04 PM

## 2020-11-20 ENCOUNTER — Encounter (HOSPITAL_COMMUNITY)
Admission: EM | Disposition: A | Discharge status: Home / Self Care | Payer: Self-pay | Source: Ambulatory Visit | Attending: Family Medicine

## 2020-11-20 ENCOUNTER — Inpatient Hospital Stay (HOSPITAL_COMMUNITY): Payer: Medicare HMO | Admitting: Certified Registered Nurse Anesthetist

## 2020-11-20 ENCOUNTER — Encounter (HOSPITAL_COMMUNITY): Payer: Self-pay | Admitting: Internal Medicine

## 2020-11-20 ENCOUNTER — Inpatient Hospital Stay (HOSPITAL_COMMUNITY): Payer: Medicare HMO

## 2020-11-20 HISTORY — PX: ESOPHAGOGASTRODUODENOSCOPY (EGD) WITH PROPOFOL: SHX5813

## 2020-11-20 HISTORY — PX: COLONOSCOPY WITH PROPOFOL: SHX5780

## 2020-11-20 HISTORY — PX: POLYPECTOMY: SHX5525

## 2020-11-20 HISTORY — PX: GIVENS CAPSULE STUDY: SHX5432

## 2020-11-20 LAB — BPAM RBC
Blood Product Expiration Date: 202204112359
Blood Product Expiration Date: 202204192359
Blood Product Expiration Date: 202204192359
Blood Product Expiration Date: 202204192359
ISSUE DATE / TIME: 202203231439
ISSUE DATE / TIME: 202203240800
ISSUE DATE / TIME: 202203241111
ISSUE DATE / TIME: 202203242017
Unit Type and Rh: 5100
Unit Type and Rh: 5100
Unit Type and Rh: 5100
Unit Type and Rh: 5100

## 2020-11-20 LAB — CBC WITH DIFFERENTIAL/PLATELET
Abs Immature Granulocytes: 0.01 10*3/uL (ref 0.00–0.07)
Basophils Absolute: 0 10*3/uL (ref 0.0–0.1)
Basophils Relative: 0 %
Eosinophils Absolute: 0.1 10*3/uL (ref 0.0–0.5)
Eosinophils Relative: 3 %
HCT: 24.8 % — ABNORMAL LOW (ref 39.0–52.0)
Hemoglobin: 7.4 g/dL — ABNORMAL LOW (ref 13.0–17.0)
Immature Granulocytes: 0 %
Lymphocytes Relative: 11 %
Lymphs Abs: 0.3 10*3/uL — ABNORMAL LOW (ref 0.7–4.0)
MCH: 22.8 pg — ABNORMAL LOW (ref 26.0–34.0)
MCHC: 29.8 g/dL — ABNORMAL LOW (ref 30.0–36.0)
MCV: 76.5 fL — ABNORMAL LOW (ref 80.0–100.0)
Monocytes Absolute: 0.2 10*3/uL (ref 0.1–1.0)
Monocytes Relative: 7 %
Neutro Abs: 2.4 10*3/uL (ref 1.7–7.7)
Neutrophils Relative %: 79 %
Platelets: 122 10*3/uL — ABNORMAL LOW (ref 150–400)
RBC: 3.24 MIL/uL — ABNORMAL LOW (ref 4.22–5.81)
RDW: 21.6 % — ABNORMAL HIGH (ref 11.5–15.5)
WBC: 3 10*3/uL — ABNORMAL LOW (ref 4.0–10.5)
nRBC: 0.7 % — ABNORMAL HIGH (ref 0.0–0.2)

## 2020-11-20 LAB — TYPE AND SCREEN
ABO/RH(D): O POS
Antibody Screen: NEGATIVE
Unit division: 0
Unit division: 0
Unit division: 0
Unit division: 0

## 2020-11-20 LAB — COMPREHENSIVE METABOLIC PANEL
ALT: 19 U/L (ref 0–44)
AST: 25 U/L (ref 15–41)
Albumin: 3.5 g/dL (ref 3.5–5.0)
Alkaline Phosphatase: 154 U/L — ABNORMAL HIGH (ref 38–126)
Anion gap: 9 (ref 5–15)
BUN: 24 mg/dL — ABNORMAL HIGH (ref 8–23)
CO2: 22 mmol/L (ref 22–32)
Calcium: 8.5 mg/dL — ABNORMAL LOW (ref 8.9–10.3)
Chloride: 105 mmol/L (ref 98–111)
Creatinine, Ser: 1.25 mg/dL — ABNORMAL HIGH (ref 0.61–1.24)
GFR, Estimated: >60 mL/min (ref >60)
Glucose, Bld: 97 mg/dL (ref 70–99)
Potassium: 3.7 mmol/L (ref 3.5–5.1)
Sodium: 136 mmol/L (ref 135–145)
Total Bilirubin: 2.4 mg/dL — ABNORMAL HIGH (ref 0.3–1.2)
Total Protein: 6.7 g/dL (ref 6.5–8.1)

## 2020-11-20 LAB — AFP TUMOR MARKER: AFP, Serum, Tumor Marker: 1.1 ng/mL (ref 0.0–8.4)

## 2020-11-20 LAB — MAGNESIUM: Magnesium: 2.5 mg/dL — ABNORMAL HIGH (ref 1.7–2.4)

## 2020-11-20 LAB — PHOSPHORUS: Phosphorus: 2.9 mg/dL (ref 2.5–4.6)

## 2020-11-20 SURGERY — ESOPHAGOGASTRODUODENOSCOPY (EGD) WITH PROPOFOL
Anesthesia: Monitor Anesthesia Care

## 2020-11-20 MED ORDER — PHENYLEPHRINE HCL-NACL 10-0.9 MG/250ML-% IV SOLN
INTRAVENOUS | Status: DC | PRN
  Administered 2020-11-20: 35 ug/min via INTRAVENOUS

## 2020-11-20 MED ORDER — ONDANSETRON HCL 4 MG/2ML IJ SOLN
INTRAMUSCULAR | Status: DC | PRN
  Administered 2020-11-20: 4 mg via INTRAVENOUS

## 2020-11-20 MED ORDER — PROPOFOL 10 MG/ML IV BOLUS
INTRAVENOUS | Status: DC | PRN
  Administered 2020-11-20 (×2): 20 mg via INTRAVENOUS

## 2020-11-20 MED ORDER — SUCRALFATE 1 G PO TABS
1.0000 g | ORAL_TABLET | Freq: Four times a day (QID) | ORAL | Status: DC
  Administered 2020-11-20 – 2020-11-23 (×11): 1 g via ORAL
  Filled 2020-11-20 (×11): qty 1

## 2020-11-20 MED ORDER — LACTATED RINGERS IV SOLN: INTRAVENOUS | Status: DC | PRN

## 2020-11-20 MED ORDER — PANTOPRAZOLE SODIUM 40 MG PO TBEC
40.0000 mg | DELAYED_RELEASE_TABLET | Freq: Two times a day (BID) | ORAL | Status: DC
  Administered 2020-11-20 – 2020-11-23 (×6): 40 mg via ORAL
  Filled 2020-11-20 (×6): qty 1

## 2020-11-20 MED ORDER — SODIUM CHLORIDE 0.9 % IV SOLN
200.0000 mg | Freq: Once | INTRAVENOUS | Status: AC
  Administered 2020-11-20: 200 mg via INTRAVENOUS
  Filled 2020-11-20: qty 40

## 2020-11-20 MED ORDER — LIDOCAINE 2% (20 MG/ML) 5 ML SYRINGE
INTRAMUSCULAR | Status: DC | PRN
  Administered 2020-11-20: 60 mg via INTRAVENOUS

## 2020-11-20 MED ORDER — PROPOFOL 500 MG/50ML IV EMUL
INTRAVENOUS | Status: DC | PRN
  Administered 2020-11-20: 125 ug/kg/min via INTRAVENOUS

## 2020-11-20 MED ORDER — SODIUM CHLORIDE 0.9 % IV SOLN
100.0000 mg | Freq: Every day | INTRAVENOUS | Status: AC
  Administered 2020-11-21 – 2020-11-22 (×2): 100 mg via INTRAVENOUS
  Filled 2020-11-20 (×2): qty 20

## 2020-11-20 SURGICAL SUPPLY — 25 items
BLOCK BITE 60FR ADLT L/F BLUE (MISCELLANEOUS) ×3 IMPLANT
ELECT REM PT RETURN 9FT ADLT (ELECTROSURGICAL)
ELECTRODE REM PT RTRN 9FT ADLT (ELECTROSURGICAL) IMPLANT
FCP BXJMBJMB 240X2.8X (CUTTING FORCEPS)
FLOOR PAD 36X40 (MISCELLANEOUS) ×3
FORCEP RJ3 GP 1.8X160 W-NEEDLE (CUTTING FORCEPS) IMPLANT
FORCEPS BIOP RAD 4 LRG CAP 4 (CUTTING FORCEPS) IMPLANT
FORCEPS BIOP RJ4 240 W/NDL (CUTTING FORCEPS)
FORCEPS BXJMBJMB 240X2.8X (CUTTING FORCEPS) IMPLANT
INJECTOR/SNARE I SNARE (MISCELLANEOUS) IMPLANT
LUBRICANT JELLY 4.5OZ STERILE (MISCELLANEOUS) IMPLANT
MANIFOLD NEPTUNE II (INSTRUMENTS) IMPLANT
NEEDLE SCLEROTHERAPY 25GX240 (NEEDLE) IMPLANT
PAD FLOOR 36X40 (MISCELLANEOUS) ×2 IMPLANT
PROBE APC STR FIRE (PROBE) IMPLANT
PROBE INJECTION GOLD (MISCELLANEOUS)
PROBE INJECTION GOLD 7FR (MISCELLANEOUS) IMPLANT
SNARE ROTATE MED OVAL 20MM (MISCELLANEOUS) IMPLANT
SNARE SHORT THROW 13M SML OVAL (MISCELLANEOUS) IMPLANT
SYR 50ML LL SCALE MARK (SYRINGE) IMPLANT
TOWEL COTTON PACK 4EA (MISCELLANEOUS) ×6 IMPLANT
TRAP SPECIMEN MUCOUS 40CC (MISCELLANEOUS) IMPLANT
TUBING ENDO SMARTCAP PENTAX (MISCELLANEOUS) ×6 IMPLANT
TUBING IRRIGATION ENDOGATOR (MISCELLANEOUS) ×3 IMPLANT
WATER STERILE IRR 1000ML POUR (IV SOLUTION) IMPLANT

## 2020-11-20 NOTE — Anesthesia Preprocedure Evaluation (Addendum)
Anesthesia Evaluation  Patient identified by MRN, date of birth, ID band Patient awake    Reviewed: Allergy & Precautions, H&P , NPO status , Patient's Chart, lab work & pertinent test results  Airway Mallampati: III  TM Distance: >3 FB Neck ROM: Full    Dental no notable dental hx. (+) Edentulous Upper, Edentulous Lower, Dental Advisory Given   Pulmonary asthma , former smoker,    Pulmonary exam normal breath sounds clear to auscultation       Cardiovascular hypertension, +CHF   Rhythm:Regular Rate:Normal     Neuro/Psych negative neurological ROS  negative psych ROS   GI/Hepatic PUD, GERD  Medicated,(+) Cirrhosis   Esophageal Varices    ,   Endo/Other  diabetes, Type 2, Oral Hypoglycemic Agents  Renal/GU Renal InsufficiencyRenal disease  negative genitourinary   Musculoskeletal  (+) Arthritis , Osteoarthritis,    Abdominal   Peds  Hematology  (+) Blood dyscrasia, anemia ,   Anesthesia Other Findings   Reproductive/Obstetrics negative OB ROS                            Anesthesia Physical Anesthesia Plan  ASA: III  Anesthesia Plan: MAC   Post-op Pain Management:    Induction: Intravenous  PONV Risk Score and Plan: 2 and Propofol infusion and Treatment may vary due to age or medical condition  Airway Management Planned: Simple Face Mask  Additional Equipment:   Intra-op Plan:   Post-operative Plan:   Informed Consent: I have reviewed the patients History and Physical, chart, labs and discussed the procedure including the risks, benefits and alternatives for the proposed anesthesia with the patient or authorized representative who has indicated his/her understanding and acceptance.     Dental advisory given  Plan Discussed with: CRNA  Anesthesia Plan Comments:         Anesthesia Quick Evaluation

## 2020-11-20 NOTE — Interval H&P Note (Signed)
History and Physical Interval Note:  11/20/2020 1:54 PM  [name]  has presented today for surgery, with the diagnosis of Anemia.  The various methods of treatment have been discussed with the patient. After consideration of risks, benefits and other options for treatment, the patient has consented to  Procedure(s): ESOPHAGOGASTRODUODENOSCOPY (EGD) WITH PROPOFOL (N/A) COLONOSCOPY WITH PROPOFOL (N/A) GIVENS CAPSULE STUDY (N/A) as a surgical intervention.  The patient's history has been reviewed, patient examined, no change in status, stable for surgery.  I have reviewed the patient's chart and labs.  Questions were answered to the patient's satisfaction.     Youlanda Mighty Jaice Lague

## 2020-11-20 NOTE — Progress Notes (Signed)
Patient's procedures were well-tolerated.  No definite source of the patient's profound anemia was seen on either the endoscopy or colonoscopy; the capsule endoscopy is in process.  The stomach did have a little bit of hemorrhagic mucosa but nothing that I feel confident that explains the patient's anemia.  He also had minimal esophageal varices and a moderate gastric varix, but it is unlikely either of those account for the patient's anemia.  In the colon, there were a few very small polyps which I snipped off, but no definite vascular ectasia, although the mucosa was somewhat fragile and would develop pinpoint hemorrhages with contact, especially in the proximal colon, where there was also some blotchy erythema but again, no discrete AVMs.  Recommendations:  1.  Clear liquid diet starting at 6 PM tonight  2.  Sodium restricted solid diet starting tomorrow morning  3.  Ideally, this patient would be kept in house until his capsule study is read, which will probably be Sunday.  I think it would be helpful to monitor the stability of his hemoglobin during that period of time, anyway.  4.  One of the patient's polyps that was snipped off was just inside the anal verge, so if the patient passes a small amount of blood (for example, a teaspoon or so), that should not be alarming.  5.  I have added sucralfate to the patient's regimen with the hope that it might diminish the hemorrhagic mucosa in his stomach, the clinical significance of which is unclear.  I am not sure what the endpoint on the sucralfate would be, but perhaps I would keep the patient on it a month or so and see how he does with respect to blood loss.  Cleotis Nipper, M.D. Pager [phone] If no answer or after 5 PM call [phone]

## 2020-11-20 NOTE — Progress Notes (Signed)
PROGRESS NOTE    [name]  DDU:[id number] DOB: [birth year] DOA: 11/18/2020 PCP: [name], MD   Chief Complaint  Patient presents with  . Weakness   Brief Narrative:  This is a 75 year old male with past medical history of [place] s/p treatment 06/2020, cirrhosis with portal hypertension and esophageal varices, CHF, CAD, type 2 diabetes, hypertension, hyperlipidemia, recurrent GI bleed who presented to the ED from the [place] office for evaluation of shortness of breath and generalized weakness x1 week.  Patient has had fatigue and generalized weakness notable on activity with shortness of breath at times.  Has intermittent dark stools.  No recent alcohol, NSAID or anticoagulant use.  No chest pain, syncope or any other complaints.  He does admit to lower extremity edema and has been taking his medications as prescribed.  He was recently hospitalized from 2/10-2/12 for anemia.  EGD on 2/11 showed grade 1 esophageal varices, gastric varices without bleeding and portal hypertensive gastropathy.  Colonoscopy was recommended but patient declined at that time and wanted to complete the colonoscopy as an outpatient.  Last colonoscopy in 2014 showed colonic AVMs and hyperplastic polyps.  He was discharged on Protonix twice daily but he ran out of his prescription.  He was seen in the GI office today for follow-up but due to his worsening symptoms he was referred to the ED.  Assessment & Plan:   Principal Problem:   Symptomatic anemia Active Problems:   Type 2 diabetes with complication (HCC)   Hyperlipidemia with target LDL less than 70   CKD (chronic kidney disease), stage III (HCC)   (HFpEF) heart failure with preserved ejection fraction (HCC)   Alcoholic cirrhosis of liver with ascites ([place])   Hepatocellular carcinoma ([place])   1. Symptomatic anemia, concern for recurrent/intermittent GI bleeding  history of cirrhosis with portal hypertension and esophageal  varices a. Hemodynamically stable on room air b. Improved Hb to 7.4 after 4 units pRBC c. Trend Hb d. GI consulted: Continue blood transfusion, no need for antibiotics or octreotide as he is not actively bleeding given heme-negative stool.  Plan for endoscopy, colonoscopy and capsule endoscopy on today.  Check AFP level (wnl) to follow-up on his [place]  2. Volume overload  HFpEF a. CXR with mild cardiogenic failure b. CXR 3/25 with cardiomegaly with pulm vascular congestion - mild interstitial edema c. Lower extremity edema and rales on exam and getting blood transfusions d. Continue lasix due to above (on torsemide 40 BID at home) e. Strict I/O, daily weights f. Echo 04/2020 with EF 60-65%, grade III diastolic dysfunction  3. COVID 19 infection 1. Minimally symptomatic 2. Remdesivir   4. Alcoholic Liver Cirrhosis  HCC s/p treatment November 2021 a. Per GI  5. Hypertension a. Continue home spironolactone  6. Hyperlipidemia a. Continue statin  7. History Asthma 1. Continue albuterol, anoro ellipta  8. Type 2 diabetes a. Monitor for now while on limited diet  DVT prophylaxis: SCD Code Status: full Family Communication: none at bedside Disposition:   Status is: Inpatient  Remains inpatient appropriate because:Inpatient level of care appropriate due to severity of illness   Dispo: The patient is from: Home              Anticipated d/c is to: Home              Patient currently is not medically stable to d/c.   Difficult to place patient No  Consultants:   GI  Procedures:   none  Antimicrobials: Anti-infectives (From admission, onward)   None     Subjective: No new complaints  Objective: Vitals:   11/19/20 2045 11/19/20 2248 11/20/20 0422 11/20/20 0423  BP: 140/60 138/66 (!) 115/53   Pulse: 73 72 77   Resp: 16 18 16    Temp: 98.2 F (36.8 C) 98.3 F (36.8 C) 98.1 F (36.7 C)   TempSrc: Oral Oral    SpO2: 100% 100% 97%   Weight:    108.3 kg   Height:        Intake/Output Summary (Last 24 hours) at 11/20/2020 1248 Last data filed at 11/20/2020 0902 Gross per 24 hour  Intake 653 ml  Output 1600 ml  Net -947 ml   Filed Weights   11/18/20 1921 11/19/20 0500 11/20/20 0423  Weight: 108 kg 108 kg 108.3 kg    Examination:  General: No acute distress. Cardiovascular: RRR Lungs: unlabored Abdomen: Soft, nontender, nondistended  Neurological: Alert and oriented 3. Moves all extremities 4 . Cranial nerves II through XII grossly intact. Skin: Warm and dry. No rashes or lesions. Extremities: bilateral LE edema, 3 +      Data Reviewed: I have personally reviewed following labs and imaging studies  CBC: Recent Labs  Lab 11/18/20 1158 11/18/20 2038 11/19/20 0210 11/19/20 1731 11/20/20 0432  WBC 4.6  --  2.8*  --  3.0*  NEUTROABS  --   --  2.2  --  2.4  HGB 4.4* 5.4* 4.8* 6.9* 7.4*  HCT 16.5* 19.8* 17.2* 23.6* 24.8*  MCV 71.7*  --  73.2*  --  76.5*  PLT 155  --  118*  --  122*    Basic Metabolic Panel: Recent Labs  Lab 11/18/20 1158 11/19/20 0210 11/20/20 0432  NA 133* 135 136  K 4.2 4.1 3.7  CL 102 104 105  CO2 20* 22 22  GLUCOSE 106* 103* 97  BUN 42* 36* 24*  CREATININE 1.41* 1.36* 1.25*  CALCIUM 8.7* 8.4* 8.5*  MG  --   --  2.5*  PHOS  --   --  2.9    GFR: Estimated Creatinine Clearance: 61.9 mL/min (A) (by C-G formula based on SCr of 1.25 mg/dL (H)).  Liver Function Tests: Recent Labs  Lab 11/18/20 1158 11/20/20 0432  AST 27 25  ALT 22 19  ALKPHOS 166* 154*  BILITOT 1.4* 2.4*  PROT 7.2 6.7  ALBUMIN 3.6 3.5    CBG: Recent Labs  Lab 11/19/20 0102  GLUCAP 102*     Recent Results (from the past 240 hour(s))  Resp Panel by RT-PCR (Flu A&B, Covid) Nasopharyngeal Swab     Status: Abnormal   Collection Time: 11/18/20  2:59 PM   Specimen: Nasopharyngeal Swab; Nasopharyngeal(NP) swabs in vial transport medium  Result Value Ref Range Status   SARS Coronavirus 2 by RT PCR POSITIVE (A)  NEGATIVE Final    Comment: RESULT CALLED TO, READ BACK BY AND VERIFIED WITH: S.CLAPP, RN AT 1800 ON 03.23.22 BY N.THOMPSON (NOTE) SARS-CoV-2 target nucleic acids are DETECTED.  The SARS-CoV-2 RNA is generally detectable in upper respiratory specimens during the acute phase of infection. Positive results are indicative of the presence of the identified virus, but do not rule out bacterial infection or co-infection with other pathogens not detected by the test. Clinical correlation with patient history and other diagnostic information is necessary to determine patient infection status. The expected result is Negative.  Fact Sheet for Patients: EntrepreneurPulse.com.au  Fact Sheet for Healthcare Providers: IncredibleEmployment.be  This  test is not yet approved or cleared by the [place] and  has been authorized for detection and/or diagnosis of SARS-CoV-2 by FDA under an Emergency Use Authorization (EUA).  This EUA will remain in effect (meaning this  test can be used) for the duration of  the COVID-19 declaration under Section 564(b)(1) of the Act, 21 U.S.C. section 360bbb-3(b)(1), unless the authorization is terminated or revoked sooner.     Influenza A by PCR NEGATIVE NEGATIVE Final   Influenza B by PCR NEGATIVE NEGATIVE Final    Comment: (NOTE) The Xpert Xpress SARS-CoV-2/FLU/RSV plus assay is intended as an aid in the diagnosis of influenza from Nasopharyngeal swab specimens and should not be used as a sole basis for treatment. Nasal washings and aspirates are unacceptable for Xpert Xpress SARS-CoV-2/FLU/RSV testing.  Fact Sheet for Patients: EntrepreneurPulse.com.au  Fact Sheet for Healthcare Providers: IncredibleEmployment.be  This test is not yet approved or cleared by the [place] FDA and has been authorized for detection and/or diagnosis of SARS-CoV-2 by FDA under an Emergency Use  Authorization (EUA). This EUA will remain in effect (meaning this test can be used) for the duration of the COVID-19 declaration under Section 564(b)(1) of the Act, 21 U.S.C. section 360bbb-3(b)(1), unless the authorization is terminated or revoked.  Performed at [facility], [place] [address] [place], [place] [address]   SARS CORONAVIRUS 2 (TAT 6-24 HRS) Nasopharyngeal Nasopharyngeal Swab     Status: None   Collection Time: 11/18/20  6:28 PM   Specimen: Nasopharyngeal Swab  Result Value Ref Range Status   SARS Coronavirus 2 NEGATIVE NEGATIVE Final    Comment: (NOTE) SARS-CoV-2 target nucleic acids are NOT DETECTED.  The SARS-CoV-2 RNA is generally detectable in upper and lower respiratory specimens during the acute phase of infection. Negative results do not preclude SARS-CoV-2 infection, do not rule out co-infections with other pathogens, and should not be used as the sole basis for treatment or other patient management decisions. Negative results must be combined with clinical observations, patient history, and epidemiological information. The expected result is Negative.  Fact Sheet for Patients: SugarRoll.be  Fact Sheet for Healthcare Providers: https://www.woods-mathews.com/  This test is not yet approved or cleared by the [place] FDA and  has been authorized for detection and/or diagnosis of SARS-CoV-2 by FDA under an Emergency Use Authorization (EUA). This EUA will remain  in effect (meaning this test can be used) for the duration of the COVID-19 declaration under Se ction 564(b)(1) of the Act, 21 U.S.C. section 360bbb-3(b)(1), unless the authorization is terminated or revoked sooner.  Performed at [facility], [place] [address] [place], [place] [address]          Radiology Studies: DG CHEST PORT 1 VIEW  Result Date: 11/20/2020 CLINICAL DATA:  Congestive heart failure EXAM: PORTABLE CHEST  1 VIEW COMPARISON:  November 18, 2020 FINDINGS: There is persistent interstitial pulmonary edema. No consolidation. There is cardiomegaly with pulmonary venous hypertension. Patient is status post coronary artery bypass grafting. No adenopathy. There are calcified nonenlarged lymph nodes. There is aortic atherosclerosis. No bone lesions. IMPRESSION: Cardiomegaly with pulmonary vascular congestion. Status post coronary artery bypass grafting. Mild interstitial edema. Suspect a degree of congestive heart failure. No consolidation. Calcified lymph nodes consistent with prior granulomatous disease. Aortic Atherosclerosis (ICD10-I70.0). Electronically Signed   By: Lowella Grip III M.D.   On: 11/20/2020 07:48   DG Chest Portable 1 View  Result Date: 11/18/2020 CLINICAL DATA:  Weakness EXAM: PORTABLE  CHEST 1 VIEW COMPARISON:  10/09/2020 FINDINGS: The lungs are symmetrically well expanded. Bilateral perihilar and lower lung zone interstitial infiltrate is present most in keeping with mild cardiogenic failure, stable since prior examination. No pneumothorax or pleural effusion. Coronary artery bypass grafting has been performed. Cardiac size is mildly enlarged. No acute bone abnormality. IMPRESSION: Mild cardiogenic failure. Electronically Signed   By: Fidela Salisbury MD   On: 11/18/2020 14:45        Scheduled Meds: . acetaminophen  650 mg Oral Once  . atorvastatin  40 mg Oral Daily  . diphenhydrAMINE  25 mg Oral Once  . furosemide  60 mg Intravenous BID  . gabapentin  100 mg Oral BID  . pantoprazole (PROTONIX) IV  40 mg Intravenous Q12H  . sodium chloride flush  3 mL Intravenous Q12H  . spironolactone  25 mg Oral Daily  . umeclidinium-vilanterol  1 puff Inhalation Daily   Continuous Infusions:   LOS: 2 days    Time spent: over 30 min    Fayrene Helper, MD [facility]   To contact the attending provider between 7A-7P or the covering provider during after hours 7P-7A, please log  into the web site [email or web address] and access using universal  password for that web site. If you do not have the password, please call the hospital operator.  11/20/2020, 12:48 PM

## 2020-11-20 NOTE — Anesthesia Procedure Notes (Signed)
Procedure Name: MAC Date/Time: 11/20/2020 1:44 PM Performed by: Maxwell Caul, CRNA Pre-anesthesia Checklist: Patient identified, Emergency Drugs available, Suction available and Patient being monitored Oxygen Delivery Method: Simple face mask

## 2020-11-20 NOTE — Anesthesia Postprocedure Evaluation (Signed)
Anesthesia Post Note  Patient: [name]  Procedure(s) Performed: ESOPHAGOGASTRODUODENOSCOPY (EGD) WITH PROPOFOL (N/A ) COLONOSCOPY WITH PROPOFOL (N/A ) GIVENS CAPSULE STUDY (N/A )     Patient location during evaluation: Endoscopy Anesthesia Type: MAC Level of consciousness: awake and alert Pain management: pain level controlled Vital Signs Assessment: post-procedure vital signs reviewed and stable Respiratory status: spontaneous breathing, nonlabored ventilation and respiratory function stable Cardiovascular status: stable and blood pressure returned to baseline Postop Assessment: no apparent nausea or vomiting Anesthetic complications: no   No complications documented.  Last Vitals:  Vitals:   11/20/20 1508 11/20/20 1515  BP: (!) 104/56 (!) 118/51  Pulse: 69 69  Resp: (!) 22 20  Temp:    SpO2: 98% 97%    Last Pain:  Vitals:   11/20/20 1515  TempSrc:   PainSc: 0-No pain                 Dimitry Holsworth,W. EDMOND

## 2020-11-20 NOTE — Care Management Important Message (Signed)
Important Message  Patient Details IM Letter placed in Patient's door caddy. Name: [name] MRN: [id number] Date of Birth: 1946-07-30   Medicare Important Message Given:  Yes     Kerin Salen 11/20/2020, 11:00 AM

## 2020-11-20 NOTE — Op Note (Signed)
[facility] Patient Name: [name] Procedure Date: 11/20/2020 MRN: [id number] Attending MD: Ronald Lobo , MD Date of Birth: 04/03/1946 CSN: [id number] Age: 75 Admit Type: Inpatient Procedure:                Upper GI endoscopy Indications:              Iron deficiency anemia secondary to chronic blood                            loss Providers:                Ronald Lobo, MD, Cleda Daub, RN, Laverda Sorenson, Technician, Christell Faith, CRNA Referring MD:              Medicines:                Monitored Anesthesia Care Complications:            No immediate complications. Estimated Blood Loss:     Estimated blood loss: none. Procedure:                Pre-Anesthesia Assessment:                           - Prior to the procedure, a History and Physical                            was performed, and patient medications and                            allergies were reviewed. The patient's tolerance of                            previous anesthesia was also reviewed. The risks                            and benefits of the procedure and the sedation                            options and risks were discussed with the patient.                            All questions were answered, and informed consent                            was obtained. Prior Anticoagulants: The patient has                            taken no previous anticoagulant or antiplatelet                            agents. ASA Grade Assessment: III - A patient with  severe systemic disease. After reviewing the risks                            and benefits, the patient was deemed in                            satisfactory condition to undergo the procedure.                           After obtaining informed consent, the endoscope was                            passed under direct vision. Throughout the                            procedure, the patient's  blood pressure, pulse, and                            oxygen saturations were monitored continuously. The                            [id number]-[id number] ([id number]) was introduced through the                            mouth, and advanced to the second part of duodenum.                            The upper GI endoscopy was accomplished without                            difficulty. The patient tolerated the procedure                            well. Scope In: Scope Out: Findings:      Grade I minimal varices were found in the distal esophagus.      The exam of the esophagus was otherwise normal.      Patchy, minimally hemorrhagic mucosa with spontaneous minimal bleeding       (intermittent scant oozing) was found on the anterior wall of the       gastric body. Of note, no blood or coffee grounds were found in the       stomach at the start of this procedure, arguing against any ongoing       significant bleeding from this area.      A solitary Type 1 isolated gastric varix (IGV1, varices located in the       fundus) with no bleeding was found in the cardia. I would consider it       medium in size.      The cardia and gastric fundus were otherwise normal on retroflexion.      The exam of the stomach was otherwise normal. Possible mild portal       gastropathy was present in the body, but it is not clear that the       observed oozing was from portal gastropathy. There certainly did not       seem  to be any discrete vascular lesion where the blood was originating       from, but rather the mucosa looked slightly erythematous and friable.      The examined duodenum was normal.      At the conclusion of the procedure, using the endoscope, the video       capsule enteroscope was advanced into the second portion of the duodenum. Impression:               - Minimal spontaneious oozing from body of stomach,                            possibly localized portal gastropathy or gastritis,                             without blood in stomach at start of procedure,                            therefore this finding is of uncertain significance.                           - Grade I esophageal varices.                           - Hemorrhagic gastropathy as described above.                           - Type 1 isolated gastric varices (IGV1, varices                            located in the fundus), without bleeding.                           - Normal examined duodenum.                           - Successful completion of the Video Capsule                            Enteroscope placement.                           - No specimens collected. Moderate Sedation:      This patient was sedated with monitored anesthesia care, not moderate       sedation. Recommendation:           - Observe patient's clinical course.                           - Perform a colonoscopy today.                           - Await results of capsule endoscopy study. Procedure Code(s):        --- Professional ---                           [phone], Esophagogastroduodenoscopy, flexible,  transoral; diagnostic, including collection of                            specimen(s) by brushing or washing, when performed                            (separate procedure) Diagnosis Code(s):        --- Professional ---                           I85.00, Esophageal varices without bleeding                           K92.2, Gastrointestinal hemorrhage, unspecified                           I86.4, Gastric varices                           D50.0, Iron deficiency anemia secondary to blood                            loss (chronic) CPT copyright 2019 American Medical Association. All rights reserved. The codes documented in this report are preliminary and upon coder review may  be revised to meet current compliance requirements. Ronald Lobo, MD 11/20/2020 3:01:36 PM This report has been signed electronically. Number of Addenda: 0

## 2020-11-20 NOTE — Transfer of Care (Signed)
Immediate Anesthesia Transfer of Care Note  Patient: [name]  Procedure(s) Performed: ESOPHAGOGASTRODUODENOSCOPY (EGD) WITH PROPOFOL (N/A ) COLONOSCOPY WITH PROPOFOL (N/A ) GIVENS CAPSULE STUDY (N/A )  Patient Location: Endoscopy room 4-Report to [facility] RN  Anesthesia Type:MAC  Level of Consciousness: awake, alert  and oriented  Airway & Oxygen Therapy: Patient Spontanous Breathing and Patient connected to face mask oxygen  Post-op Assessment: Report given to RN and Post -op Vital signs reviewed and stable  Post vital signs: Reviewed and stable  Last Vitals:  Vitals Value Taken Time  BP    Temp    Pulse    Resp    SpO2      Last Pain:  Vitals:   11/20/20 1340  TempSrc: Oral  PainSc: 0-No pain      Patients Stated Pain Goal: 0 (76/72/09 4709)  Complications: No complications documented.

## 2020-11-20 NOTE — Op Note (Signed)
[facility] Patient Name: [name] Procedure Date: 11/20/2020 MRN: [id number] Attending MD: Ronald Lobo , MD Date of Birth: 1946/06/01 CSN: [id number] Age: 75 Admit Type: Inpatient Procedure:                Colonoscopy Indications:              Last colonoscopy: 2014, Iron deficiency anemia                            secondary to chronic blood loss Providers:                Ronald Lobo, MD, Cleda Daub, RN, Laverda Sorenson, Technician, Christell Faith, CRNA Referring MD:              Medicines:                Monitored Anesthesia Care Complications:            No immediate complications. Estimated Blood Loss:     Estimated blood loss: 5 mL. Procedure:                Pre-Anesthesia Assessment:                           - Prior to the procedure, a History and Physical                            was performed, and patient medications and                            allergies were reviewed. The patient's tolerance of                            previous anesthesia was also reviewed. The risks                            and benefits of the procedure and the sedation                            options and risks were discussed with the patient.                            All questions were answered, and informed consent                            was obtained. Prior Anticoagulants: The patient has                            taken no previous anticoagulant or antiplatelet                            agents. ASA Grade Assessment: III - A patient with  severe systemic disease. After reviewing the risks                            and benefits, the patient was deemed in                            satisfactory condition to undergo the procedure.                           - Prior to the procedure, a History and Physical                            was performed, and patient medications and                            allergies  were reviewed. The patient's tolerance of                            previous anesthesia was also reviewed. The risks                            and benefits of the procedure and the sedation                            options and risks were discussed with the patient.                            All questions were answered, and informed consent                            was obtained. Prior Anticoagulants: The patient has                            taken no previous anticoagulant or antiplatelet                            agents. ASA Grade Assessment: III - A patient with                            severe systemic disease. After reviewing the risks                            and benefits, the patient was deemed in                            satisfactory condition to undergo the procedure.                           After obtaining informed consent, the colonoscope                            was passed under direct vision. Throughout the  procedure, the patient's blood pressure, pulse, and                            oxygen saturations were monitored continuously. The                            CF-HQ190L ([id number]) Olympus colonoscope was                            introduced through the anus and advanced to the the                            terminal ileum. The colonoscopy was somewhat                            difficult due to significant looping. Successful                            completion of the procedure was aided by                            straightening and shortening the scope to obtain                            bowel loop reduction. The patient tolerated the                            procedure well. The quality of the bowel                            preparation was excellent. Scope In: 2:10:11 PM Scope Out: 2:45:05 PM Scope Withdrawal Time: 0 hours 23 minutes 30 seconds  Total Procedure Duration: 0 hours 34 minutes 54 seconds  Findings:      The  perianal and digital rectal examinations were normal. Pertinent       negatives include normal prostate (size, shape, and consistency).      Two sessile polyps were found in the transverse colon. The polyps were       diminutive in size. These polyps were removed with a cold snare.       Resection and retrieval were complete. Estimated blood loss: 3 mL.      A 4 mm polyp was found in the distal rectum. The polyp was sessile. The       polyp was removed with a cold snare. Resection and retrieval were       complete. Estimated blood loss: 2 mL.      There was fairly prolonged (several minutes) oozing following each       polypectomy but this progressively decreased and was nearly stopped at       the time of conclusion of observation, so I did not do any hemostatic       maneuvers.      A couple of localized areas of mildly blotchy erythematous mucosa was       found in the proximal ascending colon; however, these did not have the       characteristic appearance of vascular ectasiae and did  not bleed on       contact, so I decided not to perform any intervention on them. There was       some minimal friability of the proximal colon, characterized by some       pinpoint hemorrhage on contact, presumably due to mucosal or vascular       fragility.      No definite AVM's, diverticula, colitis, or masses were noted. The       proximal colon was re-inspected.      The retroflexed view of the distal rectum and anal verge was normal and       showed no anal or rectal abnormalities. Reinspection of the rectum       showed no additional findings.      The terminal ileum appeared normal. Impression:               - No definite source of patient's recurrent anemia                            seen on this study.                           - Two diminutive polyps in the transverse colon,                            removed with a cold snare. Resected and retrieved.                           - One 4 mm  polyp in the distal rectum, removed with                            a cold snare. Resected and retrieved.                           - Erythematous mucosa in the proximal ascending                            colon.                           - The distal rectum and anal verge are normal on                            retroflexion view.                           - The examined portion of the ileum was normal. Moderate Sedation:      This patient was sedated with monitored anesthesia care, not moderate       sedation. Recommendation:           - Await pathology results.                           - Repeat colonoscopy is not recommended due to                            current age (89 years or  older) for surveillance,                            given the absence of advanced polyps on this exam.                           - Await results of capsule study. Procedure Code(s):        --- Professional ---                           [phone], Colonoscopy, flexible; with removal of                            tumor(s), polyp(s), or other lesion(s) by snare                            technique Diagnosis Code(s):        --- Professional ---                           K63.5, Polyp of colon                           K62.1, Rectal polyp                           K63.89, Other specified diseases of intestine                           D50.0, Iron deficiency anemia secondary to blood                            loss (chronic) CPT copyright 2019 American Medical Association. All rights reserved. The codes documented in this report are preliminary and upon coder review may  be revised to meet current compliance requirements. Ronald Lobo, MD 11/20/2020 3:15:24 PM This report has been signed electronically. Number of Addenda: 0

## 2020-11-21 LAB — CBC WITH DIFFERENTIAL/PLATELET
Abs Immature Granulocytes: 0.01 10*3/uL (ref 0.00–0.07)
Basophils Absolute: 0 10*3/uL (ref 0.0–0.1)
Basophils Relative: 1 %
Eosinophils Absolute: 0.1 10*3/uL (ref 0.0–0.5)
Eosinophils Relative: 3 %
HCT: 26.2 % — ABNORMAL LOW (ref 39.0–52.0)
Hemoglobin: 7.4 g/dL — ABNORMAL LOW (ref 13.0–17.0)
Immature Granulocytes: 0 %
Lymphocytes Relative: 12 %
Lymphs Abs: 0.4 10*3/uL — ABNORMAL LOW (ref 0.7–4.0)
MCH: 22.4 pg — ABNORMAL LOW (ref 26.0–34.0)
MCHC: 28.2 g/dL — ABNORMAL LOW (ref 30.0–36.0)
MCV: 79.2 fL — ABNORMAL LOW (ref 80.0–100.0)
Monocytes Absolute: 0.3 10*3/uL (ref 0.1–1.0)
Monocytes Relative: 10 %
Neutro Abs: 2.5 10*3/uL (ref 1.7–7.7)
Neutrophils Relative %: 74 %
Platelets: 119 10*3/uL — ABNORMAL LOW (ref 150–400)
RBC: 3.31 MIL/uL — ABNORMAL LOW (ref 4.22–5.81)
RDW: 22 % — ABNORMAL HIGH (ref 11.5–15.5)
WBC: 3.4 10*3/uL — ABNORMAL LOW (ref 4.0–10.5)
nRBC: 0 % (ref 0.0–0.2)

## 2020-11-21 LAB — MAGNESIUM: Magnesium: 2.1 mg/dL (ref 1.7–2.4)

## 2020-11-21 LAB — PHOSPHORUS: Phosphorus: 3.2 mg/dL (ref 2.5–4.6)

## 2020-11-21 LAB — COMPREHENSIVE METABOLIC PANEL
ALT: 19 U/L (ref 0–44)
AST: 23 U/L (ref 15–41)
Albumin: 3.5 g/dL (ref 3.5–5.0)
Alkaline Phosphatase: 137 U/L — ABNORMAL HIGH (ref 38–126)
Anion gap: 9 (ref 5–15)
BUN: 20 mg/dL (ref 8–23)
CO2: 22 mmol/L (ref 22–32)
Calcium: 8.3 mg/dL — ABNORMAL LOW (ref 8.9–10.3)
Chloride: 102 mmol/L (ref 98–111)
Creatinine, Ser: 1.2 mg/dL (ref 0.61–1.24)
GFR, Estimated: >60 mL/min (ref >60)
Glucose, Bld: 86 mg/dL (ref 70–99)
Potassium: 3.9 mmol/L (ref 3.5–5.1)
Sodium: 133 mmol/L — ABNORMAL LOW (ref 135–145)
Total Bilirubin: 1.4 mg/dL — ABNORMAL HIGH (ref 0.3–1.2)
Total Protein: 6.5 g/dL (ref 6.5–8.1)

## 2020-11-21 NOTE — Progress Notes (Signed)
PROGRESS NOTE    [name]  WEX:[id number] DOB: [birth year] DOA: 11/18/2020 PCP: [name], MD   Chief Complaint  Patient presents with  . Weakness   Brief Narrative:  This is [name] 75 year old male with past medical history of [place] s/p treatment 06/2020, cirrhosis with portal hypertension and esophageal varices, CHF, CAD, type 2 diabetes, hypertension, hyperlipidemia, recurrent GI bleed who presented to the ED from the [place] office for evaluation of shortness of breath and generalized weakness x1 week.  Patient has had fatigue and generalized weakness notable on activity with shortness of breath at times.  Has intermittent dark stools.  No recent alcohol, NSAID or anticoagulant use.  No chest pain, syncope or any other complaints.  He does admit to lower extremity edema and has been taking his medications as prescribed.  He was recently hospitalized from 2/10-2/12 for anemia.  EGD on 2/11 showed grade 1 esophageal varices, gastric varices without bleeding and portal hypertensive gastropathy.  Colonoscopy was recommended but patient declined at that time and wanted to complete the colonoscopy as an outpatient.  Last colonoscopy in 2014 showed colonic AVMs and hyperplastic polyps.  He was discharged on Protonix twice daily but he ran out of his prescription.  He was seen in the GI office today for follow-up but due to his worsening symptoms he was referred to the ED.  Assessment & Plan:   Principal Problem:   Symptomatic anemia Active Problems:   Type 2 diabetes with complication (HCC)   Hyperlipidemia with target LDL less than 70   CKD (chronic kidney disease), stage III (HCC)   (HFpEF) heart failure with preserved ejection fraction (HCC)   Alcoholic cirrhosis of liver with ascites ([place])   Hepatocellular carcinoma ([place])   1. Symptomatic anemia, concern for recurrent/intermittent GI bleeding  history of cirrhosis with portal hypertension and esophageal  varices [name]. Hemodynamically stable on room air b. Improved Hb to 7.4 after 4 units pRBC - stable today c. Trend Hb d. GI consulted: Continue blood transfusion, no need for antibiotics or octreotide as he is not actively bleeding given heme-negative stool.  Plan for endoscopy, colonoscopy and capsule endoscopy on today.  Check AFP level (wnl) to follow-up on his [place]  2. Volume overload  HFpEF [name]. CXR with mild cardiogenic failure b. CXR 3/25 with cardiomegaly with pulm vascular congestion - mild interstitial edema c. He remains overloaded, will continue IV diuresis for now  d. Continue lasix due to above (on torsemide 40 BID at home) e. Strict I/O, daily weights f. Echo 04/2020 with EF 60-65%, grade III diastolic dysfunction  3. COVID 19 infection 1. Minimally symptomatic 2. Remdesivir   4. Alcoholic Liver Cirrhosis  HCC s/p treatment November 2021 [name]. Per GI  5. Hypertension [name]. Continue home spironolactone  6. Hyperlipidemia [name]. Continue statin  7. History Asthma 1. Continue albuterol, anoro ellipta  8. Type 2 diabetes [name]. Monitor for now while on limited diet  DVT prophylaxis: SCD Code Status: full Family Communication: none at bedside Disposition:   Status is: Inpatient  Remains inpatient appropriate because:Inpatient level of care appropriate due to severity of illness   Dispo: The patient is from: Home              Anticipated d/c is to: Home              Patient currently is not medically stable to d/c.   Difficult to place patient No  Consultants:   GI  Procedures:  none  Antimicrobials: Anti-infectives (From admission, onward)   Start     Dose/Rate Route Frequency Ordered Stop   11/21/20 1000  remdesivir 100 mg in sodium chloride 0.9 % 100 mL IVPB       "Followed by" Linked Group Details   100 mg 200 mL/hr over 30 Minutes Intravenous Daily 11/20/20 1313 11/23/20 0959   11/20/20 1400  remdesivir 200 mg in sodium chloride 0.9% 250 mL IVPB        "Followed by" Linked Group Details   200 mg 580 mL/hr over 30 Minutes Intravenous Once 11/20/20 1313 11/20/20 1652     Subjective: No new complaints today Swelling is improving  Objective: Vitals:   11/20/20 1951 11/21/20 0450 11/21/20 0500 11/21/20 1332  BP: (!) 117/50 117/63  131/61  Pulse: 68 68  74  Resp: 17 16  17   Temp: 98.2 F (36.8 C) 98.1 F (36.7 C)  98.6 F (37 C)  TempSrc: Oral Oral    SpO2: 100% 95%  93%  Weight:   107.6 kg   Height:        Intake/Output Summary (Last 24 hours) at 11/21/2020 1713 Last data filed at 11/21/2020 1500 Gross per 24 hour  Intake 463 ml  Output 3150 ml  Net -2687 ml   Filed Weights   11/19/20 0500 11/20/20 0423 11/21/20 0500  Weight: 108 kg 108.3 kg 107.6 kg    Examination:  General: No acute distress. Cardiovascular: Heart sounds show [name] regular rate, and rhythm. Lungs: Clear to auscultation bilaterally  Abdomen: Soft, nontender, nondistended  Neurological: Alert and oriented 3. Moves all extremities 4. Cranial nerves II through XII grossly intact. Skin: Warm and dry. No rashes or lesions. Extremities: 3 + LE edema     Data Reviewed: I have personally reviewed following labs and imaging studies  CBC: Recent Labs  Lab 11/18/20 1158 11/18/20 2038 11/19/20 0210 11/19/20 1731 11/20/20 0432 11/21/20 0412  WBC 4.6  --  2.8*  --  3.0* 3.4*  NEUTROABS  --   --  2.2  --  2.4 2.5  HGB 4.4* 5.4* 4.8* 6.9* 7.4* 7.4*  HCT 16.5* 19.8* 17.2* 23.6* 24.8* 26.2*  MCV 71.7*  --  73.2*  --  76.5* 79.2*  PLT 155  --  118*  --  122* 119*    Basic Metabolic Panel: Recent Labs  Lab 11/18/20 1158 11/19/20 0210 11/20/20 0432 11/21/20 0412  NA 133* 135 136 133*  K 4.2 4.1 3.7 3.9  CL 102 104 105 102  CO2 20* 22 22 22   GLUCOSE 106* 103* 97 86  BUN 42* 36* 24* 20  CREATININE 1.41* 1.36* 1.25* 1.20  CALCIUM 8.7* 8.4* 8.5* 8.3*  MG  --   --  2.5* 2.1  PHOS  --   --  2.9 3.2    GFR: Estimated Creatinine Clearance: 64.2  mL/min (by C-G formula based on SCr of 1.2 mg/dL).  Liver Function Tests: Recent Labs  Lab 11/18/20 1158 11/20/20 0432 11/21/20 0412  AST 27 25 23   ALT 22 19 19   ALKPHOS 166* 154* 137*  BILITOT 1.4* 2.4* 1.4*  PROT 7.2 6.7 6.5  ALBUMIN 3.6 3.5 3.5    CBG: Recent Labs  Lab 11/19/20 0102  GLUCAP 102*     Recent Results (from the past 240 hour(s))  Resp Panel by RT-PCR (Flu Viriginia Amendola&B, Covid) Nasopharyngeal Swab     Status: Abnormal   Collection Time: 11/18/20  2:59 PM   Specimen: Nasopharyngeal Swab; Nasopharyngeal(NP)  swabs in vial transport medium  Result Value Ref Range Status   SARS Coronavirus 2 by RT PCR POSITIVE ([name]) NEGATIVE Final    Comment: RESULT CALLED TO, READ BACK BY AND VERIFIED WITH: S.CLAPP, RN AT 1800 ON 03.23.22 BY N.THOMPSON (NOTE) SARS-CoV-2 target nucleic acids are DETECTED.  The SARS-CoV-2 RNA is generally detectable in upper respiratory specimens during the acute phase of infection. Positive results are indicative of the presence of the identified virus, but do not rule out bacterial infection or co-infection with other pathogens not detected by the test. Clinical correlation with patient history and other diagnostic information is necessary to determine patient infection status. The expected result is Negative.  Fact Sheet for Patients: EntrepreneurPulse.com.au  Fact Sheet for Healthcare Providers: IncredibleEmployment.be  This test is not yet approved or cleared by the [place] FDA and  has been authorized for detection and/or diagnosis of SARS-CoV-2 by FDA under an Emergency Use Authorization (EUA).  This EUA will remain in effect (meaning this  test can be used) for the duration of  the COVID-19 declaration under Section 564(b)(1) of the Act, 21 U.S.C. section 360bbb-3(b)(1), unless the authorization is terminated or revoked sooner.     Influenza [name] by PCR NEGATIVE NEGATIVE Final   Influenza B by PCR  NEGATIVE NEGATIVE Final    Comment: (NOTE) The Xpert Xpress SARS-CoV-2/FLU/RSV plus assay is intended as an aid in the diagnosis of influenza from Nasopharyngeal swab specimens and should not be used as [name] sole basis for treatment. Nasal washings and aspirates are unacceptable for Xpert Xpress SARS-CoV-2/FLU/RSV testing.  Fact Sheet for Patients: EntrepreneurPulse.com.au  Fact Sheet for Healthcare Providers: IncredibleEmployment.be  This test is not yet approved or cleared by the [place] FDA and has been authorized for detection and/or diagnosis of SARS-CoV-2 by FDA under an Emergency Use Authorization (EUA). This EUA will remain in effect (meaning this test can be used) for the duration of the COVID-19 declaration under Section 564(b)(1) of the Act, 21 U.S.C. section 360bbb-3(b)(1), unless the authorization is terminated or revoked.  Performed at [facility], [place] [address], [place], [place] [address]   SARS CORONAVIRUS 2 (TAT 6-24 HRS) Nasopharyngeal Nasopharyngeal Swab     Status: None   Collection Time: 11/18/20  6:28 PM   Specimen: Nasopharyngeal Swab  Result Value Ref Range Status   SARS Coronavirus 2 NEGATIVE NEGATIVE Final    Comment: (NOTE) SARS-CoV-2 target nucleic acids are NOT DETECTED.  The SARS-CoV-2 RNA is generally detectable in upper and lower respiratory specimens during the acute phase of infection. Negative results do not preclude SARS-CoV-2 infection, do not rule out co-infections with other pathogens, and should not be used as the sole basis for treatment or other patient management decisions. Negative results must be combined with clinical observations, patient history, and epidemiological information. The expected result is Negative.  Fact Sheet for Patients: SugarRoll.be  Fact Sheet for Healthcare Providers: https://www.woods-mathews.com/  This  test is not yet approved or cleared by the [place] FDA and  has been authorized for detection and/or diagnosis of SARS-CoV-2 by FDA under an Emergency Use Authorization (EUA). This EUA will remain  in effect (meaning this test can be used) for the duration of the COVID-19 declaration under Se ction 564(b)(1) of the Act, 21 U.S.C. section 360bbb-3(b)(1), unless the authorization is terminated or revoked sooner.  Performed at [facility], [place] [address] [place], [place] [address]          Radiology  Studies: DG CHEST PORT 1 VIEW  Result Date: 11/20/2020 CLINICAL DATA:  Congestive heart failure EXAM: PORTABLE CHEST 1 VIEW COMPARISON:  November 18, 2020 FINDINGS: There is persistent interstitial pulmonary edema. No consolidation. There is cardiomegaly with pulmonary venous hypertension. Patient is status post coronary artery bypass grafting. No adenopathy. There are calcified nonenlarged lymph nodes. There is aortic atherosclerosis. No bone lesions. IMPRESSION: Cardiomegaly with pulmonary vascular congestion. Status post coronary artery bypass grafting. Mild interstitial edema. Suspect [name] degree of congestive heart failure. No consolidation. Calcified lymph nodes consistent with prior granulomatous disease. Aortic Atherosclerosis (ICD10-I70.0). Electronically Signed   By: Lowella Grip III M.D.   On: 11/20/2020 07:48        Scheduled Meds: . acetaminophen  650 mg Oral Once  . atorvastatin  40 mg Oral Daily  . diphenhydrAMINE  25 mg Oral Once  . furosemide  60 mg Intravenous BID  . gabapentin  100 mg Oral BID  . pantoprazole  40 mg Oral BID AC  . sodium chloride flush  3 mL Intravenous Q12H  . spironolactone  25 mg Oral Daily  . sucralfate  1 g Oral QID  . umeclidinium-vilanterol  1 puff Inhalation Daily   Continuous Infusions: . remdesivir 100 mg in NS 100 mL 100 mg (11/21/20 0942)     LOS: 3 days    Time spent: over 30 min    Fayrene Helper, MD [facility]   To contact the attending provider between 7A-7P or the covering provider during after hours 7P-7A, please log into the web site [email or web address] and access using universal [place] password for that web site. If you do not have the password, please call the hospital operator.  11/21/2020, 5:13 PM

## 2020-11-21 NOTE — Progress Notes (Signed)
Patient feels well and is eating well.  He has no complaints.  His nurse does not notice any obvious issues.  He has not had any significant bleeding, and his hemoglobin is unchanged over the past 24 hours.  Plan:   1.  I hope to read the patient's capsule endoscopy study today.  Once it is read, unless significant pathology is detected on that study, the patient would be okay for discharge.  (I will notify the patient's hospitalist attending physician of the capsule study results as soon as they are ready, whether that be today or tomorrow).  2.  When the patient is discharged, I would probably continue sucralfate for at least 1 month because of the gastritis noted at the time of his EGD, and the absence of alternative sources of bleeding.  3.  At the time of discharge, the patient should be instructed to call our office as soon as possible, to arrange office follow-up, ideally with Dr. Alessandra Bevels who saw the patient on his previous admission and who had planned to see him in follow-up following that admission.  Cleotis Nipper, M.D. Pager [phone] If no answer or after 5 PM call [phone]

## 2020-11-22 LAB — COMPREHENSIVE METABOLIC PANEL
ALT: 17 U/L (ref 0–44)
AST: 21 U/L (ref 15–41)
Albumin: 3.1 g/dL — ABNORMAL LOW (ref 3.5–5.0)
Alkaline Phosphatase: 139 U/L — ABNORMAL HIGH (ref 38–126)
Anion gap: 8 (ref 5–15)
BUN: 23 mg/dL (ref 8–23)
CO2: 25 mmol/L (ref 22–32)
Calcium: 8.1 mg/dL — ABNORMAL LOW (ref 8.9–10.3)
Chloride: 100 mmol/L (ref 98–111)
Creatinine, Ser: 1.46 mg/dL — ABNORMAL HIGH (ref 0.61–1.24)
GFR, Estimated: 50 mL/min — ABNORMAL LOW (ref >60)
Glucose, Bld: 113 mg/dL — ABNORMAL HIGH (ref 70–99)
Potassium: 3.9 mmol/L (ref 3.5–5.1)
Sodium: 133 mmol/L — ABNORMAL LOW (ref 135–145)
Total Bilirubin: 1.1 mg/dL (ref 0.3–1.2)
Total Protein: 6.1 g/dL — ABNORMAL LOW (ref 6.5–8.1)

## 2020-11-22 LAB — CBC WITH DIFFERENTIAL/PLATELET
Abs Immature Granulocytes: 0.01 10*3/uL (ref 0.00–0.07)
Basophils Absolute: 0 10*3/uL (ref 0.0–0.1)
Basophils Relative: 1 %
Eosinophils Absolute: 0.1 10*3/uL (ref 0.0–0.5)
Eosinophils Relative: 3 %
HCT: 24.2 % — ABNORMAL LOW (ref 39.0–52.0)
Hemoglobin: 6.9 g/dL — CL (ref 13.0–17.0)
Immature Granulocytes: 0 %
Lymphocytes Relative: 15 %
Lymphs Abs: 0.4 10*3/uL — ABNORMAL LOW (ref 0.7–4.0)
MCH: 22.6 pg — ABNORMAL LOW (ref 26.0–34.0)
MCHC: 28.5 g/dL — ABNORMAL LOW (ref 30.0–36.0)
MCV: 79.3 fL — ABNORMAL LOW (ref 80.0–100.0)
Monocytes Absolute: 0.3 10*3/uL (ref 0.1–1.0)
Monocytes Relative: 10 %
Neutro Abs: 2 10*3/uL (ref 1.7–7.7)
Neutrophils Relative %: 71 %
Platelets: 106 10*3/uL — ABNORMAL LOW (ref 150–400)
RBC: 3.05 MIL/uL — ABNORMAL LOW (ref 4.22–5.81)
RDW: 22.4 % — ABNORMAL HIGH (ref 11.5–15.5)
WBC: 2.8 10*3/uL — ABNORMAL LOW (ref 4.0–10.5)
nRBC: 0.7 % — ABNORMAL HIGH (ref 0.0–0.2)

## 2020-11-22 LAB — MAGNESIUM: Magnesium: 2 mg/dL (ref 1.7–2.4)

## 2020-11-22 LAB — HEMOGLOBIN AND HEMATOCRIT, BLOOD
HCT: 28.8 % — ABNORMAL LOW (ref 39.0–52.0)
Hemoglobin: 8.6 g/dL — ABNORMAL LOW (ref 13.0–17.0)

## 2020-11-22 LAB — PREPARE RBC (CROSSMATCH)

## 2020-11-22 LAB — PHOSPHORUS: Phosphorus: 3.2 mg/dL (ref 2.5–4.6)

## 2020-11-22 MED ORDER — TORSEMIDE 20 MG PO TABS
40.0000 mg | ORAL_TABLET | Freq: Two times a day (BID) | ORAL | Status: DC
  Administered 2020-11-23: 40 mg via ORAL
  Filled 2020-11-22 (×2): qty 2

## 2020-11-22 MED ORDER — SODIUM CHLORIDE 0.9% IV SOLUTION: Freq: Once | INTRAVENOUS | Status: AC

## 2020-11-22 MED ORDER — FUROSEMIDE 10 MG/ML IJ SOLN
60.0000 mg | Freq: Once | INTRAMUSCULAR | Status: AC
  Administered 2020-11-22: 60 mg via INTRAVENOUS
  Filled 2020-11-22: qty 6

## 2020-11-22 NOTE — Progress Notes (Signed)
PROGRESS NOTE    [name]  MLY:[id number] DOB: [birth year] DOA: 11/18/2020 PCP: [name], MD   Chief Complaint  Patient presents with  . Weakness   Brief Narrative:  This is [name] 75 year old male with past medical history of [place] s/p treatment 06/2020, cirrhosis with portal hypertension and esophageal varices, CHF, CAD, type 2 diabetes, hypertension, hyperlipidemia, recurrent GI bleed who presented to the ED from the [place] office for evaluation of shortness of breath and generalized weakness x1 week.  Patient has had fatigue and generalized weakness notable on activity with shortness of breath at times.  Has intermittent dark stools.  No recent alcohol, NSAID or anticoagulant use.  No chest pain, syncope or any other complaints.  He does admit to lower extremity edema and has been taking his medications as prescribed.  He was recently hospitalized from 2/10-2/12 for anemia.  EGD on 2/11 showed grade 1 esophageal varices, gastric varices without bleeding and portal hypertensive gastropathy.  Colonoscopy was recommended but patient declined at that time and wanted to complete the colonoscopy as an outpatient.  Last colonoscopy in 2014 showed colonic AVMs and hyperplastic polyps.  He was discharged on Protonix twice daily but he ran out of his prescription.  He was seen in the GI office today for follow-up but due to his worsening symptoms he was referred to the ED.  Assessment & Plan:   Principal Problem:   Symptomatic anemia Active Problems:   Type 2 diabetes with complication (HCC)   Hyperlipidemia with target LDL less than 70   CKD (chronic kidney disease), stage III (HCC)   (HFpEF) heart failure with preserved ejection fraction (HCC)   Alcoholic cirrhosis of liver with ascites ([place])   Hepatocellular carcinoma ([place])   1. Symptomatic anemia, concern for recurrent/intermittent GI bleeding  history of cirrhosis with portal hypertension and esophageal  varices [name]. Hemodynamically stable on room air b. 6.9 today, follow after additional 2 units pRBC c. Trend Hb d. GI consulted: s/p endoscopy/colonoscopy/capsule study -> endoscopy with minimal spontaneous oozing from body of stomach, possibly localized portal gastropathy or gastritis, without blood in stomach at start of procedure (unclear sig) - grade 1 esophageal varices - hemorrhagic gastropathy - type 1 isolated gastric varices - normal duodenum --- colonoscopy without definite source of recurrent anemia - 2 diminutive polyps in transverse colon, removed with cold snare - 1 4 mm polyp in the distal rectum, removed with cold snare - erythematous mucosa in the proximal ascending colon --- capsule endscopy without blood in GI tract or active lesions - Kinneth Fujiwara couple of erythematous lesions, probably AVM's were seen, probably in region of ileocecal valve 1. GI noted ok for d/c once hb stable post transfusion, needs close follow up for repeat Hb, periodic follow up in office -- follow pending biopsies  2. Volume overload  HFpEF Jaton Eilers. CXR with mild cardiogenic failure b. CXR 3/25 with cardiomegaly with pulm vascular congestion - mild interstitial edema c. He remains overloaded, will continue IV diuresis for now  d. Continue lasix due to above (on torsemide 40 BID at home) -> likely transition to PO torsemide tomorrow e. Strict I/O, daily weights f. Echo 04/2020 with EF 60-65%, grade III diastolic dysfunction  3. CKD 3a: creatinine fluctuating here.  Baseline appears to be 1.3-1.4  1. Follow closely with diuresis  4. COVID 19 infection 1. Minimally symptomatic 2. Remdesivir x3 days  5. Alcoholic Liver Cirrhosis  HCC s/p treatment November 2021 [name]. Per GI  5. Hypertension  [name]. Continue home spironolactone  6. Hyperlipidemia [name]. Continue statin  7. History Asthma 1. Continue albuterol, anoro ellipta  8. Type 2 diabetes [name]. Monitor for now while on limited diet  DVT prophylaxis: SCD Code  Status: full Family Communication: son at bedside Disposition:   Status is: Inpatient  Remains inpatient appropriate because:Inpatient level of care appropriate due to severity of illness   Dispo: The patient is from: Home              Anticipated d/c is to: Home              Patient currently is not medically stable to d/c.   Difficult to place patient No  Consultants:   GI  Procedures:   none  Antimicrobials: Anti-infectives (From admission, onward)   Start     Dose/Rate Route Frequency Ordered Stop   11/21/20 1000  remdesivir 100 mg in sodium chloride 0.9 % 100 mL IVPB       "Followed by" Linked Group Details   100 mg 200 mL/hr over 30 Minutes Intravenous Daily 11/20/20 1313 11/22/20 1149   11/20/20 1400  remdesivir 200 mg in sodium chloride 0.9% 250 mL IVPB       "Followed by" Linked Group Details   200 mg 580 mL/hr over 30 Minutes Intravenous Once 11/20/20 1313 11/20/20 1652     Subjective: No new complaints Seems Lariyah Shetterly little frustrated no definitive answer  Objective: Vitals:   11/22/20 1222 11/22/20 1453 11/22/20 1505 11/22/20 1527  BP: (!) 106/52 (!) 126/56 (!) 126/56 (!) 128/58  Pulse: 63 66 68 65  Resp: 18 16  18   Temp: 98.2 F (36.8 C) 97.8 F (36.6 C) 97.8 F (36.6 C) 97.9 F (36.6 C)  TempSrc: Oral Oral Oral Oral  SpO2: 100% 100% 100% 99%  Weight:      Height:        Intake/Output Summary (Last 24 hours) at 11/22/2020 1604 Last data filed at 11/22/2020 1500 Gross per 24 hour  Intake 1151 ml  Output 2550 ml  Net -1399 ml   Filed Weights   11/20/20 0423 11/21/20 0500 11/22/20 0500  Weight: 108.3 kg 107.6 kg 106.4 kg    Examination:  General: No acute distress. Cardiovascular: Heart sounds show Malissa Slay regular rate, and rhythm. Lungs: Clear to auscultation bilaterally  Abdomen: Soft, nontender, nondistended Neurological: Alert and oriented 3. Moves all extremities 4. Cranial nerves II through XII grossly intact. Skin: Warm and dry. No rashes  or lesions. Extremities: 2-3+ LE edema     Data Reviewed: I have personally reviewed following labs and imaging studies  CBC: Recent Labs  Lab 11/18/20 1158 11/18/20 2038 11/19/20 0210 11/19/20 1731 11/20/20 0432 11/21/20 0412 11/22/20 0324  WBC 4.6  --  2.8*  --  3.0* 3.4* 2.8*  NEUTROABS  --   --  2.2  --  2.4 2.5 2.0  HGB 4.4*   < > 4.8* 6.9* 7.4* 7.4* 6.9*  HCT 16.5*   < > 17.2* 23.6* 24.8* 26.2* 24.2*  MCV 71.7*  --  73.2*  --  76.5* 79.2* 79.3*  PLT 155  --  118*  --  122* 119* 106*   < > = values in this interval not displayed.    Basic Metabolic Panel: Recent Labs  Lab 11/18/20 1158 11/19/20 0210 11/20/20 0432 11/21/20 0412 11/22/20 0324  NA 133* 135 136 133* 133*  K 4.2 4.1 3.7 3.9 3.9  CL 102 104 105 102 100  CO2 20*  22 22 22 25   GLUCOSE 106* 103* 97 86 113*  BUN 42* 36* 24* 20 23  CREATININE 1.41* 1.36* 1.25* 1.20 1.46*  CALCIUM 8.7* 8.4* 8.5* 8.3* 8.1*  MG  --   --  2.5* 2.1 2.0  PHOS  --   --  2.9 3.2 3.2    GFR: Estimated Creatinine Clearance: 52.5 mL/min (Lacy Sofia) (by C-G formula based on SCr of 1.46 mg/dL (H)).  Liver Function Tests: Recent Labs  Lab 11/18/20 1158 11/20/20 0432 11/21/20 0412 11/22/20 0324  AST 27 25 23 21   ALT 22 19 19 17   ALKPHOS 166* 154* 137* 139*  BILITOT 1.4* 2.4* 1.4* 1.1  PROT 7.2 6.7 6.5 6.1*  ALBUMIN 3.6 3.5 3.5 3.1*    CBG: Recent Labs  Lab 11/19/20 0102  GLUCAP 102*     Recent Results (from the past 240 hour(s))  Resp Panel by RT-PCR (Flu Vitaliy Eisenhour&B, Covid) Nasopharyngeal Swab     Status: Abnormal   Collection Time: 11/18/20  2:59 PM   Specimen: Nasopharyngeal Swab; Nasopharyngeal(NP) swabs in vial transport medium  Result Value Ref Range Status   SARS Coronavirus 2 by RT PCR POSITIVE ([place]) NEGATIVE Final    Comment: RESULT CALLED TO, READ BACK BY AND VERIFIED WITH: S.CLAPP, RN AT 1800 ON 03.23.22 BY N.THOMPSON (NOTE) SARS-CoV-2 target nucleic acids are DETECTED.  The SARS-CoV-2 RNA is generally  detectable in upper respiratory specimens during the acute phase of infection. Positive results are indicative of the presence of the identified virus, but do not rule out bacterial infection or co-infection with other pathogens not detected by the test. Clinical correlation with patient history and other diagnostic information is necessary to determine patient infection status. The expected result is Negative.  Fact Sheet for Patients: EntrepreneurPulse.com.au  Fact Sheet for Healthcare Providers: IncredibleEmployment.be  This test is not yet approved or cleared by the [place] FDA and  has been authorized for detection and/or diagnosis of SARS-CoV-2 by FDA under an Emergency Use Authorization (EUA).  This EUA will remain in effect (meaning this  test can be used) for the duration of  the COVID-19 declaration under Section 564(b)(1) of the Act, 21 U.S.C. section 360bbb-3(b)(1), unless the authorization is terminated or revoked sooner.     Influenza Ivett Luebbe by PCR NEGATIVE NEGATIVE Final   Influenza B by PCR NEGATIVE NEGATIVE Final    Comment: (NOTE) The Xpert Xpress SARS-CoV-2/FLU/RSV plus assay is intended as an aid in the diagnosis of influenza from Nasopharyngeal swab specimens and should not be used as [place] Alexa sole basis for treatment. Nasal washings and aspirates are unacceptable for Xpert Xpress SARS-CoV-2/FLU/RSV testing.  Fact Sheet for Patients: EntrepreneurPulse.com.au  Fact Sheet for Healthcare Providers: IncredibleEmployment.be  This test is not yet approved or cleared by the [place] FDA and has been authorized for detection and/or diagnosis of SARS-CoV-2 by FDA under an Emergency Use Authorization (EUA). This EUA will remain in effect (meaning this test can be used) for the duration of the COVID-19 declaration under Section 564(b)(1) of the Act, 21 U.S.C. section 360bbb-3(b)(1), unless the  authorization is terminated or revoked.  Performed at [facility], [place] [address], [place], [place] [address]   SARS CORONAVIRUS 2 (TAT 6-24 HRS) Nasopharyngeal Nasopharyngeal Swab     Status: None   Collection Time: 11/18/20  6:28 PM   Specimen: Nasopharyngeal Swab  Result Value Ref Range Status   SARS Coronavirus 2 NEGATIVE NEGATIVE Final    Comment: (NOTE) SARS-CoV-2 target nucleic acids  are NOT DETECTED.  The SARS-CoV-2 RNA is generally detectable in upper and lower respiratory specimens during the acute phase of infection. Negative results do not preclude SARS-CoV-2 infection, do not rule out co-infections with other pathogens, and should not be used as the sole basis for treatment or other patient management decisions. Negative results must be combined with clinical observations, patient history, and epidemiological information. The expected result is Negative.  Fact Sheet for Patients: SugarRoll.be  Fact Sheet for Healthcare Providers: https://www.woods-mathews.com/  This test is not yet approved or cleared by the [place] FDA and  has been authorized for detection and/or diagnosis of SARS-CoV-2 by FDA under an Emergency Use Authorization (EUA). This EUA will remain  in effect (meaning this test can be used) for the duration of the COVID-19 declaration under Se ction 564(b)(1) of the Act, 21 U.S.C. section 360bbb-3(b)(1), unless the authorization is terminated or revoked sooner.  Performed at [facility], [place] [address], [place], [place] [address]          Radiology Studies: No results found.      Scheduled Meds: . acetaminophen  650 mg Oral Once  . atorvastatin  40 mg Oral Daily  . diphenhydrAMINE  25 mg Oral Once  . gabapentin  100 mg Oral BID  . pantoprazole  40 mg Oral BID AC  . sodium chloride flush  3 mL Intravenous Q12H  . spironolactone  25 mg Oral Daily  . sucralfate  1  g Oral QID  . umeclidinium-vilanterol  1 puff Inhalation Daily   Continuous Infusions:    LOS: 4 days    Time spent: over 30 min    Fayrene Helper, MD [facility]   To contact the attending provider between 7A-7P or the covering provider during after hours 7P-7A, please log into the web site [email or web address] and access using universal [place] password for that web site. If you do not have the password, please call the hospital operator.  11/22/2020, 4:04 PM

## 2020-11-22 NOTE — Progress Notes (Signed)
Slight decline in hgb noted, from 7.4 to 6.9, over past 24hrs.  Diuresis in progress.  Feels ok.  Pt is concerned about the fact he needs more blood.  No BM's.  CAPSULE ENDOSCOPY shows no blood in GI tract or active bleeding during the study.  A couple of erythematous lesions, probably AVM's were seen, probably in the region of the ileo-cecal valve.  However, no clear source of the patient's anemia was seen on the study. (The report will be scanned into Epic in the next day or so; please see rept for more details.)  RECOMMEND:  1. OK for dischg if hgb stable post transfusion 2. Needs early f/u (within a few days post dischg) at office of his PCP to check hgb 3. Pt needs periodic f/u in our office, ideally w/ Dr. Alessandra Bevels who had planned to see him following recent admission. 4. I suspect that, unfortunately, there is a good chance that this pt will be back in the hospital before long, w/ recurrent anemia, in which case I would favor repeating egd/colon/capsule with the hope that we get lucky with timing and find a "smoking gun" that explains the anemia, and which can then be treated. 5. Will ask nurses to check stools for appearance of blood.  Cleotis Nipper, M.D. Pager [phone] If no answer or after 5 PM call [phone]

## 2020-11-23 ENCOUNTER — Encounter (HOSPITAL_COMMUNITY): Payer: Self-pay | Admitting: Gastroenterology

## 2020-11-23 LAB — COMPREHENSIVE METABOLIC PANEL
ALT: 14 U/L (ref 0–44)
AST: 25 U/L (ref 15–41)
Albumin: 3.2 g/dL — ABNORMAL LOW (ref 3.5–5.0)
Alkaline Phosphatase: 132 U/L — ABNORMAL HIGH (ref 38–126)
Anion gap: 8 (ref 5–15)
BUN: 25 mg/dL — ABNORMAL HIGH (ref 8–23)
CO2: 25 mmol/L (ref 22–32)
Calcium: 8.2 mg/dL — ABNORMAL LOW (ref 8.9–10.3)
Chloride: 101 mmol/L (ref 98–111)
Creatinine, Ser: 1.22 mg/dL (ref 0.61–1.24)
GFR, Estimated: >60 mL/min (ref >60)
Glucose, Bld: 90 mg/dL (ref 70–99)
Potassium: 3.9 mmol/L (ref 3.5–5.1)
Sodium: 134 mmol/L — ABNORMAL LOW (ref 135–145)
Total Bilirubin: 1.5 mg/dL — ABNORMAL HIGH (ref 0.3–1.2)
Total Protein: 6.2 g/dL — ABNORMAL LOW (ref 6.5–8.1)

## 2020-11-23 LAB — CBC WITH DIFFERENTIAL/PLATELET
Abs Immature Granulocytes: 0.02 10*3/uL (ref 0.00–0.07)
Basophils Absolute: 0 10*3/uL (ref 0.0–0.1)
Basophils Relative: 1 %
Eosinophils Absolute: 0.1 10*3/uL (ref 0.0–0.5)
Eosinophils Relative: 4 %
HCT: 27.5 % — ABNORMAL LOW (ref 39.0–52.0)
Hemoglobin: 8.3 g/dL — ABNORMAL LOW (ref 13.0–17.0)
Immature Granulocytes: 1 %
Lymphocytes Relative: 15 %
Lymphs Abs: 0.4 10*3/uL — ABNORMAL LOW (ref 0.7–4.0)
MCH: 23.9 pg — ABNORMAL LOW (ref 26.0–34.0)
MCHC: 30.2 g/dL (ref 30.0–36.0)
MCV: 79 fL — ABNORMAL LOW (ref 80.0–100.0)
Monocytes Absolute: 0.2 10*3/uL (ref 0.1–1.0)
Monocytes Relative: 8 %
Neutro Abs: 2.2 10*3/uL (ref 1.7–7.7)
Neutrophils Relative %: 71 %
Platelets: 104 10*3/uL — ABNORMAL LOW (ref 150–400)
RBC: 3.48 MIL/uL — ABNORMAL LOW (ref 4.22–5.81)
RDW: 21.9 % — ABNORMAL HIGH (ref 11.5–15.5)
WBC: 3 10*3/uL — ABNORMAL LOW (ref 4.0–10.5)
nRBC: 0 % (ref 0.0–0.2)

## 2020-11-23 LAB — TYPE AND SCREEN
ABO/RH(D): O POS
Antibody Screen: NEGATIVE
Unit division: 0
Unit division: 0

## 2020-11-23 LAB — SURGICAL PATHOLOGY

## 2020-11-23 LAB — BPAM RBC
Blood Product Expiration Date: 202204262359
Blood Product Expiration Date: 202204272359
ISSUE DATE / TIME: 202203271148
ISSUE DATE / TIME: 202203271454
Unit Type and Rh: 5100
Unit Type and Rh: 5100

## 2020-11-23 LAB — PHOSPHORUS: Phosphorus: 3.1 mg/dL (ref 2.5–4.6)

## 2020-11-23 LAB — HEMOGLOBIN AND HEMATOCRIT, BLOOD
HCT: 30.7 % — ABNORMAL LOW (ref 39.0–52.0)
Hemoglobin: 9.2 g/dL — ABNORMAL LOW (ref 13.0–17.0)

## 2020-11-23 LAB — MAGNESIUM: Magnesium: 2 mg/dL (ref 1.7–2.4)

## 2020-11-23 MED ORDER — SUCRALFATE 1 G PO TABS: 1.0000 g | ORAL_TABLET | Freq: Four times a day (QID) | ORAL | 0 refills | Status: DC

## 2020-11-23 NOTE — Consult Note (Signed)
   [facility] Inpatient Consult   11/23/2020  [name] [birth year] [id number]   Patient chart reviewed for potential [place] [facility] CM) services due to high unplanned readmission risk score and 4 hospitalizations within past 6 months. Per chart review, patient has primary MD services with Dr. Sharlet Salina, Velora Heckler at [facility]. PCP office offers ambulatory chronic case management services through [facility] CM embedded program.  Plan: Continue to follow for progression and disposition and update RN case manager at [facility] of patient admission.  Of note, [facility] Care Management services does not replace or interfere with any services that are arranged by inpatient case management or social work.  Netta Cedars, MSN, [facility] Liaison Nurse Mobile Phone [phone]  Toll free office [phone]

## 2020-11-23 NOTE — Care Management Important Message (Signed)
Important Message  Patient Details IM Letter placed in Patient's door caddy. Name: ELAZAR ARGABRIGHT MRN: [id number] Date of Birth: 1946/05/04   Medicare Important Message Given:  Yes     Kerin Salen 11/23/2020, 10:45 AM

## 2020-11-23 NOTE — Discharge Summary (Signed)
Physician Discharge Summary  [name] VEH:[id number] DOB: 1946/02/03 DOA: 11/18/2020  PCP: [name], MD  Admit date: 11/18/2020 Discharge date: 11/23/2020  Time spent: 40 minutes  Recommendations for Outpatient Follow-up:  1. Follow outpatient CBC/CMP 2. Follow with GI outpatient - follow pending surgical pathology, follow anemia 3. Follow with cardiology outpatient - follow edema  4. Isolated/mask per CDC guidelines  Discharge Diagnoses:  Principal Problem:   Symptomatic anemia Active Problems:   Type 2 diabetes with complication (HCC)   Hyperlipidemia with target LDL less than 70   CKD (chronic kidney disease), stage III (HCC)   (HFpEF) heart failure with preserved ejection fraction (HCC)   Alcoholic cirrhosis of liver with ascites ([place])   Hepatocellular carcinoma ([place])   Discharge Condition: stable  Diet recommendation: 2 g sodium restricted diet  Filed Weights   11/21/20 0500 11/22/20 0500 11/23/20 0521  Weight: 107.6 kg 106.4 kg 108.6 kg   History of present illness:  This is [name] 75 year old male with past medical history of [place] s/p treatment 06/2020, cirrhosis with portal hypertension and esophageal varices, CHF, CAD, type 2 diabetes, hypertension, hyperlipidemia, recurrent GI bleed who presented to the ED from the [place] office for evaluation ofshortness of breath andgeneralized weakness x1 week. Patient has had fatigue and generalized weakness notable on activity with shortness of breath at times. Has intermittent dark stools. No recent alcohol, NSAID or anticoagulant use. No chest pain, syncope or any other complaints. He does admit to lower extremity edema and has been taking his medications as prescribed.  He was recently hospitalized from 2/10-2/12 for anemia. EGD on 2/11 showed grade 1 esophageal varices, gastric varices without bleeding and portal hypertensive gastropathy. Colonoscopy was recommended but patient declined at that time and  wanted to complete the colonoscopy as an outpatient. Last colonoscopy in 2014 showed colonic AVMs and hyperplastic polyps. He was discharged on Protonix twice daily but he ran out of his prescription. He was seen in the GI office today for follow-up but due to his worsening symptoms he was referred to the ED.  He was admitted for recurrent anemia.  He had colonoscopy, egd, and capsule study which were not revealing of [name] definitive cause of bleeding.  He was discharged on 3/28 in stable condition (he'd been transfused 6 units throughout this hospitalization).  Hospitalization was also c/b volume overload.  He was diuresed and was discharged with improved edema.  See below for additional details  Hospital Course:  1. Symptomatic anemia, concern for recurrent/intermittent GI bleedinghistory of cirrhosis with portal hypertension and esophageal varices [name]. Hemodynamically stable on room air b. 9.2 today, appropriate bump after 2 units yesterday (6 units during this admission) c. GI consulted: s/p endoscopy/colonoscopy/capsule study -> endoscopy with minimal spontaneous oozing from body of stomach, possibly localized portal gastropathy or gastritis, without blood in stomach at start of procedure (unclear sig) - grade 1 esophageal varices - hemorrhagic gastropathy - type 1 isolated gastric varices - normal duodenum --- colonoscopy without definite source of recurrent anemia - 2 diminutive polyps in transverse colon, removed with cold snare - 1 4 mm polyp in the distal rectum, removed with cold snare - erythematous mucosa in the proximal ascending colon --- capsule endscopy without blood in GI tract or active lesions - [name] couple of erythematous lesions, probably AVM's were seen, probably in region of ileocecal valve 1. GI noted ok for d/c once hb stable post transfusion, needs close follow up for repeat Hb, periodic follow up in  office -- follow pending biopsies - transverse colon - tubular adenoma, benign  colonic mucosa, no high grade dysplasia or malignancy --- rectum - hyperplastic polyp, no high grade dysplasia or malignancy  2. Volume overload  HFpEF [name]. CXR with mild cardiogenic failure b. CXR 3/25 with cardiomegaly with pulm vascular congestion - mild interstitial edema c. Improved, transition back to torsemide d. Strict I/O, daily weights e. Echo 04/2020 with EF 60-65%, grade III diastolic dysfunction  3. CKD 3a: creatinine fluctuating here.  Baseline appears to be 1.3-1.4           1. Follow closely with diuresis  4. COVID 19 infection 1. Minimally symptomatic 2. Remdesivir x3 days 3. Isolate/mask per cdc guidelines  5. Alcoholic Liver Cirrhosis  HCC s/p treatment November 2021 [name]. Per GI  5. Hypertension [name]. Continue home spironolactone  6. Hyperlipidemia [name]. Continue statin  7. History Asthma 1. Continue albuterol, anoro ellipta  8. Type 2 diabetes [name]. Monitor for now while on limited diet   Procedures:  Capsule, upper endoscopy, colonoscopy as above (see reports)    Consultations:  GI  Discharge Exam: Vitals:   11/22/20 2106 11/23/20 0521  BP: 119/62 123/61  Pulse: 67 65  Resp: 20 19  Temp: 98 F (36.7 C) 98.2 F (36.8 C)  SpO2: 98% 96%   Eager to discharge No complaints Feels better  General: No acute distress. Cardiovascular: Heart sounds show [name] regular rate, and rhythm. No gallops or rubs. No murmurs. No JVD. Lungs: Clear to auscultation bilaterally  Abdomen: Soft, nontender, nondistended Neurological: Alert and oriented 3. Moves all extremities 4. Cranial nerves II through XII grossly intact. Skin: Warm and dry. No rashes or lesions. Extremities: improved LE edema, still present, but improved, 1+  Discharge Instructions   Discharge Instructions    (HEART FAILURE PATIENTS) Call MD:  Anytime you have any of the following symptoms: 1) 3 pound weight gain in 24 hours or 5 pounds in 1 week 2) shortness of breath, with or without [name]  dry hacking cough 3) swelling in the hands, feet or stomach 4) if you have to sleep on extra pillows at night in order to breathe.   Complete by: As directed    Call MD for:  difficulty breathing, headache or visual disturbances   Complete by: As directed    Call MD for:  extreme fatigue   Complete by: As directed    Call MD for:  hives   Complete by: As directed    Call MD for:  persistant dizziness or light-headedness   Complete by: As directed    Call MD for:  persistant nausea and vomiting   Complete by: As directed    Call MD for:  redness, tenderness, or signs of infection (pain, swelling, redness, odor or green/yellow discharge around incision site)   Complete by: As directed    Call MD for:  severe uncontrolled pain   Complete by: As directed    Call MD for:  temperature >100.4   Complete by: As directed    Diet 2 gram sodium   Complete by: As directed    Discharge instructions   Complete by: As directed    You were seen for anemia.  You've improved after transfusion, though an obvious cause for your anemia is still unclear.  Please follow up with [facility] gastroenterology outpatient.  Continue your protonix twice daily and the sucralfate 4 times daily.  You have pending surgical pathology that should be followed up with  gastroenterology.  You have swelling related to heart failure and your cirrhosis.  Continue your torsemide and [name] 2 gram sodium restricted diet.  Follow up with your PCP and gastroenterologist outpatient.  You were seen for incidental covid infection as well.  You have not developed any symptoms.  Per CDC guidelines, as long as you remain asymptomatic, isolate for 5 days from your positive test, then wear John Bakula mask for the next 5 days (until after April 2nd).   Return for new, recurrent, or worsening symptoms.   Please ask your PCP to request records from this hospitalization so they know what was done and what the next steps will be.   Increase activity slowly    Complete by: As directed      Allergies as of 11/23/2020      Reactions   Other    Pt states "I am not taking anymore flu shots. It liked to have killed me."      Medication List    TAKE these medications   Accu-Chek Softclix Lancets lancets USE AS DIRECTED TO TEST BLOOD SUGAR FOUR TIMES DAILY   albuterol 108 (90 Base) MCG/ACT inhaler Commonly known as: VENTOLIN HFA Inhale 2 puffs into the lungs every 6 (six) hours as needed for wheezing or shortness of breath.   Anoro Ellipta 62.5-25 MCG/INH Aepb Generic drug: umeclidinium-vilanterol Inhale 1 puff into the lungs daily.   atorvastatin 40 MG tablet Commonly known as: LIPITOR TAKE 1 TABLET BY MOUTH DAILY AT 6 PM What changed: when to take this   blood glucose meter kit and supplies Dispense based on patient and insurance preference. Use up to four times daily as directed. (FOR ICD-10 E10.9, E11.9).   gabapentin 100 MG capsule Commonly known as: NEURONTIN TAKE 1 CAPSULE(100 MG) BY MOUTH TWICE DAILY What changed: See the new instructions.   glimepiride 2 MG tablet Commonly known as: AMARYL Take 2 mg by mouth daily.   METAMUCIL PO Take 15 mLs by mouth 3 (three) times daily with meals. Mix with 4 oz of juice or water   metFORMIN 850 MG tablet Commonly known as: GLUCOPHAGE TAKE 1 TABLET(850 MG) BY MOUTH TWICE DAILY WITH [name] MEAL What changed: See the new instructions.   nitroGLYCERIN 0.4 MG SL tablet Commonly known as: NITROSTAT Place 1 tablet (0.4 mg total) under the tongue every 5 (five) minutes as needed for chest pain.   pantoprazole 40 MG tablet Commonly known as: PROTONIX Take 1 tablet (40 mg total) by mouth 2 (two) times daily.   potassium chloride SA 20 MEQ tablet Commonly known as: KLOR-CON Take 1 tablet (20 mEq total) by mouth daily.   spironolactone 25 MG tablet Commonly known as: ALDACTONE Take 1 tablet (25 mg total) by mouth daily.   sucralfate 1 g tablet Commonly known as: CARAFATE Take 1 tablet  (1 g total) by mouth 4 (four) times daily.   torsemide 20 MG tablet Commonly known as: DEMADEX Take 2 tablets (40 mg total) by mouth 2 (two) times daily.      Allergies  Allergen Reactions  . Other     Pt states "I am not taking anymore flu shots. It liked to have killed me."    Follow-up Information    [name], MD Follow up.   Specialty: Internal Medicine Contact information: [place] [address] [phone]        Nahser, Wonda Cheng, MD .   Specialty: Cardiology Contact information: [place]  [address] [place] [address] [phone]        Otis Brace, MD Follow up.   Specialty: Gastroenterology Why: Call for [name] follow up appt Contact information: [place] [address] [phone]                The results of significant diagnostics from this hospitalization (including imaging, microbiology, ancillary and laboratory) are listed below for reference.    Significant Diagnostic Studies: DG CHEST PORT 1 VIEW  Result Date: 11/20/2020 CLINICAL DATA:  Congestive heart failure EXAM: PORTABLE CHEST 1 VIEW COMPARISON:  November 18, 2020 FINDINGS: There is persistent interstitial pulmonary edema. No consolidation. There is cardiomegaly with pulmonary venous hypertension. Patient is status post coronary artery bypass grafting. No adenopathy. There are calcified nonenlarged lymph nodes. There is aortic atherosclerosis. No bone lesions. IMPRESSION: Cardiomegaly with pulmonary vascular congestion. Status post coronary artery bypass grafting. Mild interstitial edema. Suspect Dalynn Jhaveri degree of congestive heart failure. No consolidation. Calcified lymph nodes consistent with prior granulomatous disease. Aortic Atherosclerosis (ICD10-I70.0). Electronically Signed   By: Lowella Grip III M.D.   On: 11/20/2020 07:48   DG Chest Portable 1 View  Result Date: 11/18/2020 CLINICAL DATA:  Weakness EXAM: PORTABLE  CHEST 1 VIEW COMPARISON:  10/09/2020 FINDINGS: The lungs are symmetrically well expanded. Bilateral perihilar and lower lung zone interstitial infiltrate is present most in keeping with mild cardiogenic failure, stable since prior examination. No pneumothorax or pleural effusion. Coronary artery bypass grafting has been performed. Cardiac size is mildly enlarged. No acute bone abnormality. IMPRESSION: Mild cardiogenic failure. Electronically Signed   By: Fidela Salisbury MD   On: 11/18/2020 14:45    Microbiology: Recent Results (from the past 240 hour(s))  Resp Panel by RT-PCR (Flu Lillyanne Bradburn&B, Covid) Nasopharyngeal Swab     Status: Abnormal   Collection Time: 11/18/20  2:59 PM   Specimen: Nasopharyngeal Swab; Nasopharyngeal(NP) swabs in vial transport medium  Result Value Ref Range Status   SARS Coronavirus 2 by RT PCR POSITIVE (Marcele Kosta) NEGATIVE Final    Comment: RESULT CALLED TO, READ BACK BY AND VERIFIED WITH: S.CLAPP, RN AT 1800 ON 03.23.22 BY N.THOMPSON (NOTE) SARS-CoV-2 target nucleic acids are DETECTED.  The SARS-CoV-2 RNA is generally detectable in upper respiratory specimens during the acute phase of infection. Positive results are indicative of the presence of the identified virus, but do not rule out bacterial infection or co-infection with other pathogens not detected by the test. Clinical correlation with patient history and other diagnostic information is necessary to determine patient infection status. The expected result is Negative.  Fact Sheet for Patients: EntrepreneurPulse.com.au  Fact Sheet for Healthcare Providers: IncredibleEmployment.be  This test is not yet approved or cleared by the [place] FDA and  has been authorized for detection and/or diagnosis of SARS-CoV-2 by FDA under an Emergency Use Authorization (EUA).  This EUA will remain in effect (meaning this  test can be used) for the duration of  the COVID-19 declaration under  Section 564(b)(1) of the Act, 21 U.S.C. section 360bbb-3(b)(1), unless the authorization is terminated or revoked sooner.     Influenza Justice Aguirre by PCR NEGATIVE NEGATIVE Final   Influenza B by PCR NEGATIVE NEGATIVE Final    Comment: (NOTE) The Xpert Xpress SARS-CoV-2/FLU/RSV plus assay is intended as an aid in the diagnosis of influenza from Nasopharyngeal swab specimens and should not be used as [name] sole basis for treatment. Nasal washings and aspirates are unacceptable for Xpert Xpress SARS-CoV-2/FLU/RSV testing.  Fact Sheet  for Patients: EntrepreneurPulse.com.au  Fact Sheet for Healthcare Providers: IncredibleEmployment.be  This test is not yet approved or cleared by the [place] FDA and has been authorized for detection and/or diagnosis of SARS-CoV-2 by FDA under an Emergency Use Authorization (EUA). This EUA will remain in effect (meaning this test can be used) for the duration of the COVID-19 declaration under Section 564(b)(1) of the Act, 21 U.S.C. section 360bbb-3(b)(1), unless the authorization is terminated or revoked.  Performed at [facility], [place] [address], [place], [place] [address]   SARS CORONAVIRUS 2 (TAT 6-24 HRS) Nasopharyngeal Nasopharyngeal Swab     Status: None   Collection Time: 11/18/20  6:28 PM   Specimen: Nasopharyngeal Swab  Result Value Ref Range Status   SARS Coronavirus 2 NEGATIVE NEGATIVE Final    Comment: (NOTE) SARS-CoV-2 target nucleic acids are NOT DETECTED.  The SARS-CoV-2 RNA is generally detectable in upper and lower respiratory specimens during the acute phase of infection. Negative results do not preclude SARS-CoV-2 infection, do not rule out co-infections with other pathogens, and should not be used as the sole basis for treatment or other patient management decisions. Negative results must be combined with clinical observations, patient history, and epidemiological information.  The expected result is Negative.  Fact Sheet for Patients: SugarRoll.be  Fact Sheet for Healthcare Providers: https://www.woods-mathews.com/  This test is not yet approved or cleared by the [place] FDA and  has been authorized for detection and/or diagnosis of SARS-CoV-2 by FDA under an Emergency Use Authorization (EUA). This EUA will remain  in effect (meaning this test can be used) for the duration of the COVID-19 declaration under Se ction 564(b)(1) of the Act, 21 U.S.C. section 360bbb-3(b)(1), unless the authorization is terminated or revoked sooner.  Performed at [facility], [place] [address], [place], [place] [address]      Labs: Basic Metabolic Panel: Recent Labs  Lab 11/19/20 0210 11/20/20 0432 11/21/20 0412 11/22/20 0324 11/23/20 0332  NA 135 136 133* 133* 134*  K 4.1 3.7 3.9 3.9 3.9  CL 104 105 102 100 101  CO2 '22 22 22 25 25  ' GLUCOSE 103* 97 86 113* 90  BUN 36* 24* 20 23 25*  CREATININE 1.36* 1.25* 1.20 1.46* 1.22  CALCIUM 8.4* 8.5* 8.3* 8.1* 8.2*  MG  --  2.5* 2.1 2.0 2.0  PHOS  --  2.9 3.2 3.2 3.1   Liver Function Tests: Recent Labs  Lab 11/18/20 1158 11/20/20 0432 11/21/20 0412 11/22/20 0324 11/23/20 0332  AST '27 25 23 21 25  ' ALT '22 19 19 17 14  ' ALKPHOS 166* 154* 137* 139* 132*  BILITOT 1.4* 2.4* 1.4* 1.1 1.5*  PROT 7.2 6.7 6.5 6.1* 6.2*  ALBUMIN 3.6 3.5 3.5 3.1* 3.2*   No results for input(s): LIPASE, AMYLASE in the last 168 hours. No results for input(s): AMMONIA in the last 168 hours. CBC: Recent Labs  Lab 11/19/20 0210 11/19/20 1731 11/20/20 0432 11/21/20 0412 11/22/20 0324 11/22/20 1930 11/23/20 0332 11/23/20 1230  WBC 2.8*  --  3.0* 3.4* 2.8*  --  3.0*  --   NEUTROABS 2.2  --  2.4 2.5 2.0  --  2.2  --   HGB 4.8*   < > 7.4* 7.4* 6.9* 8.6* 8.3* 9.2*  HCT 17.2*   < > 24.8* 26.2* 24.2* 28.8* 27.5* 30.7*  MCV 73.2*  --  76.5* 79.2* 79.3*  --  79.0*  --   PLT 118*  --  122* 119*  106*  --  104*  --    < > = values in this interval not displayed.   Cardiac Enzymes: No results for input(s): CKTOTAL, CKMB, CKMBINDEX, TROPONINI in the last 168 hours. BNP: BNP (last 3 results) Recent Labs    08/14/20 0353 10/08/20 1727 10/10/20 0333  BNP 414.2* 311.1* 432.1*    ProBNP (last 3 results) No results for input(s): PROBNP in the last 8760 hours.  CBG: Recent Labs  Lab 11/19/20 0102  GLUCAP 102*       Signed:  Fayrene Helper MD.  [facility] 11/23/2020, 6:05 PM

## 2020-11-24 ENCOUNTER — Telehealth: Payer: Self-pay

## 2020-11-24 ENCOUNTER — Other Ambulatory Visit: Payer: Self-pay

## 2020-11-24 ENCOUNTER — Telehealth: Payer: Self-pay | Admitting: *Deleted

## 2020-11-24 ENCOUNTER — Ambulatory Visit: Payer: Medicare HMO | Admitting: Podiatry

## 2020-11-24 DIAGNOSIS — N1831 Chronic kidney disease, stage 3a: Secondary | ICD-10-CM

## 2020-11-24 NOTE — Chronic Care Management (AMB) (Signed)
  Chronic Care Management   Outreach Note  11/24/2020 Name: [name] MRN: [id number] DOB: 1946-07-22  [name] is a 75 y.o. year old male who is a primary care patient of [name], MD. I reached out to [name] by phone today in response to a referral sent by Mr. Francisco Ostrovsky Heidenreich's PCP, [name], MD     An unsuccessful telephone outreach was attempted today. The patient was referred to the case management team for assistance with care management and care coordination.   Follow Up Plan: A HIPAA compliant phone message was left for the patient providing contact information and requesting a return call.  The care management team will reach out to the patient again over the next 7 days.  If patient returns call to provider office, please advise to call Ephrata Lysle Morales at [facility]

## 2020-11-24 NOTE — Telephone Encounter (Cosign Needed)
Transition Care Management Follow-up Telephone Call  Date of discharge and from where: 11/23/2020 from [facility]  How have you been since you were released from the hospital? Feeling good; no complaints  Any questions or concerns? No  Items Reviewed:  Did the pt receive and understand the discharge instructions provided? Yes   Medications obtained and verified? Yes   Other? No   Any new allergies since your discharge? No   Dietary orders reviewed? Yes, low sodium diet Do you have support at home? Yes , son  [place] and Equipment/Supplies: Were home health services ordered? no If so, what is the name of the agency? n/a  Has the agency set up a time to come to the patient's home? no Were any new equipment or medical supplies ordered?  No What is the name of the medical supply agency? n/a Were you able to get the supplies/equipment? no Do you have any questions related to the use of the equipment or supplies? No  Functional Questionnaire: (I = Independent and D = Dependent) ADLs:  I  Bathing/Dressing- I  Meal Prep- I  Eating- I  Maintaining continence- I  Transferring/Ambulation- I  Managing Meds- I  Follow up appointments reviewed:   PCP Hospital f/u appt confirmed? Yes  Scheduled to see Pricilla Holm, MD on 12/04/2020 @ 3:20 pm.  [facility] f/u appt confirmed? Yes  Scheduled to see Richardson Dopp, PA-C at [facility] on 12/01/2020 @ 9:30 am.  Are transportation arrangements needed? No   If their condition worsens, is the pt aware to call PCP or go to the Emergency Dept.? Yes  Was the patient provided with contact information for the PCP's office or ED? Yes  Was to pt encouraged to call back with questions or concerns? Yes

## 2020-11-25 ENCOUNTER — Telehealth: Payer: Self-pay | Admitting: Internal Medicine

## 2020-11-25 NOTE — Telephone Encounter (Signed)
LVM for pt to rtn my call to schedule awv with nha. Please schedule appt if pt calls the office.  

## 2020-11-27 ENCOUNTER — Inpatient Hospital Stay: Payer: Medicare HMO | Admitting: Internal Medicine

## 2020-11-30 NOTE — Progress Notes (Deleted)
Cardiology Office Note:    Date:  11/30/2020   ID:  [name], DOB December 15, 1945, MRN [id number]  PCP:  John Koch, MD   [place]  Cardiologist:  John Moores, MD *** Advanced Practice Provider:  No care team member to display Electrophysiologist:  None  {Press F2 to show EP APP, CHF, sleep or structural heart MD               :[id number]}  { Click here to update then REFRESH NOTE - MD (PCP) or APP (Team Member)  Change PCP Type for MD, Specialty for APP is either Cardiology or Clinical Cardiac Electrophysiology  :[id number]}   Referring MD: [name], *   Chief Complaint:  No chief complaint on file.    Patient Profile:   {Link to SnapShot    :1}  [name] is a 75 y.o. male with:   Coronary artery disease  ? S/p DES to LAD in 2013 ? S/p CABG 05/2014  HFpEF  ? Echo 2/19: EF 60-65, mod LVH  Diabetes mellitus 2  Hypertension   Hyperlipidemia   Chronic kidney disease   Chronic anemia   Hx of GI bleed   EGD in 12/21: small non-bleeding varices; severe gastric portal HTN w friability/contact bleeding  Admx 1/22; 2/22; 3/22 w worsening anemia requiring multiple transfusions with PRBCs  Hepatic cirrhosis ? Esophageal varices ? Thrombocytopenia ? Heavy ETOH use  OSA  Dilated ascending aorta (echo 9/21: 42 mm)   Prior CV studies: Echocardiogram 05/27/2020 EF 60-65, GR 3 DD, normal RVSF, RVSP 36.9, mild LAE, mild to moderate RAE, moderate MAC, mean mitral valve gradient 3 mmHg, moderate AV calcification with trivial AI, mild aortic stenosis (mean gradient 10 mmHg), ascending aorta 42 mm (mild dilatation)  Echocardiogram 07/30/2019 EF 55-60, normal RV SF, moderate LAE, trace MR, trivial TR, RVSP 45.5  Echocardiogram 10/02/17 Mod LVH, EF 60-65, no RWMA, mild AI, mild MAC, mod BAE  Pre-CABG Dopplers 03/29/14 Summary:  Bilateral: 1-39% ICA stenosis. Vertebral artery flow is antegrade.  ABI is within normal  limits with abnormal Doppler waveforms.   Cardiac catheterization 03/28/14 LM prox 30, dist 70-80 LAD ostial 60-70, patent prox stent LCx ostial 90, mid 80-90 RCA prox to mid 50 EF 60      History of Present Illness:    [name] was last seen by Dr. Acie Fredrickson in 9/21.  A follow-up echocardiogram demonstrated normal LV function with severe diastolic dysfunction.  Routine 24-monthfollow-up was arranged.  He was admitted in 12/21 with decompensated HF in the setting of worsening anemia due to upper GI bleeding.  He has been admitted 3 times since then (January, February and March 2022) with symptomatic anemia requiring transfusion with PRBCs multiple times.  He typically has decompensated heart failure in association with this requiring diuresis.    He returns for f/u.  ***         Past Medical History:  Diagnosis Date  . Arthritis    "joints tighten up sometimes" (03/27/2104)  . Carcinoma of liver, hepatocellular ([facility] 06/30/[place]  . CHF (congestive heart failure) ([facility]   . Chronic lower back pain   . Coronary artery disease    a. 05/2012 Cath/PCI: LM 40ost, 50-60d, LAD 99p ruptured plaque (3.0x28 DES), LCX 50p/m, RCA 30-40p, 551mEF 65-70%.  . Coronary artery disease involving native coronary artery of native heart without angina pectoris    Severe left main disease at catheterization July 2015  CABG x3 with a LIMA to the LAD, SVG to the OM, SVG to the PDA on 03/31/14. EF 60% by cath.   . Diastolic heart failure ([place])   . GERD without esophagitis 08/04/2010  . Hepatic cirrhosis ([place])    a. Dx 01/2014 - CT a/p   . History of blood transfusion    "related to bleeding ulcers"  . History of concussion    1976--  NO RESIDUAL  . History of GI bleed    a. UGIB 07/2012;  b. 01/2014 admission with GIB/FOB stool req 1U prbc's->EGD showed portal gastropathy, barrett's esoph, and chronic active h. pylori gastritis.  Marland Kitchen History of gout    2007 &  2008  LEFT LEG-- NO ISSUE SINCE  . Hyperlipidemia   .  Iron deficiency anemia   . Kidney stones   . OA (osteoarthritis of spine)    LOWER BACK--  INTERMITTANT LEFT LEG NUMBNESS  . OSA (obstructive sleep apnea)    PULMOLOGIST-  DR CLANCE--  MODERATE OSA  STARTED CPAP 2012--  BUT CURRENTLY HAS NOT USED PAST 6 MONTHS  . Phimosis    a. s/p circumcision 2015.  . Type 2 diabetes mellitus ([place])   . Unspecified essential hypertension     Current Medications: No outpatient medications have been marked as taking for the 12/01/20 encounter (Appointment) with Richardson Dopp T, PA-C.     Allergies:   Other   Social History   Tobacco Use  . Smoking status: Former Smoker    Packs/day: 2.00    Years: 40.00    Pack years: 80.00    Types: Cigarettes    Quit date: 08/29/1996    Years since quitting: 24.2  . Smokeless tobacco: Never Used  Vaping Use  . Vaping Use: Never used  Substance Use Topics  . Alcohol use: Yes    Alcohol/week: 8.0 standard drinks    Types: 8 Cans of beer per week    Comment: 2 beers every other day  . Drug use: No     Family Hx: The patient's family history includes Cancer in his brother, father, mother, sister, and sister; Colon cancer in an other family member; Coronary artery disease in an other family member; Diabetes in an other family member; Lung cancer in his sister.  ROS   EKGs/Labs/Other Test Reviewed:    EKG:  EKG is *** ordered today.  The ekg ordered today demonstrates ***  Recent Labs: 12/28/2019: TSH 2.761 10/10/2020: B Natriuretic Peptide 432.1 11/23/2020: ALT 14; BUN 25; Creatinine, Ser 1.22; Hemoglobin 9.2; Magnesium 2.0; Platelets 104; Potassium 3.9; Sodium 134   Recent Lipid Panel Lab Results  Component Value Date/Time   CHOL 150 11/01/2019 10:30 AM   TRIG 69 11/01/2019 10:30 AM   HDL 38 (L) 11/01/2019 10:30 AM   CHOLHDL 3.9 11/01/2019 10:30 AM   LDLCALC 98 11/01/2019 10:30 AM      Risk Assessment/Calculations:   {Does this patient have ATRIAL FIBRILLATION?:[phone]}  Physical Exam:     VS:  There were no vitals taken for this visit.    Wt Readings from Last 3 Encounters:  11/23/20 239 lb 6.7 oz (108.6 kg)  10/10/20 246 lb 7.6 oz (111.8 kg)  09/11/20 233 lb 0.4 oz (105.7 kg)     Physical Exam ***     ASSESSMENT & PLAN:    *** 1. Chronic heart failure with preserved ejection fraction [facility]) He has had several admissions to the hospital with decompensated diastolic heart failure.  Echocardiogram in  December 2020 with EF 55-60.  Volume status is currently stable.  His breathing is overall improved.  He still has significant lower extremity swelling.  Continue current dose of torsemide and spironolactone.  Obtain follow-up BMP today.  I have encouraged him to use Ace wrap for compression to improve his lower extremity swelling.  Follow-up with Dr. Acie Fredrickson or me in 8 weeks.  2. Coronary artery disease involving native coronary artery of native heart without angina pectoris History of CABG in 2015.  He is not having anginal symptoms.  He is not on aspirin due to bleeding risk.  Continue high intensity statin therapy.  3. Essential hypertension Fair control.  Continue current management.  4. Hepatic cirrhosis, unspecified hepatic cirrhosis type, unspecified whether ascites present [facility]) Continue follow-up with primary care.  5. Stage 3a chronic kidney disease Obtain follow-up BMP today.  6. Iron deficiency anemia, unspecified iron deficiency anemia type Recent hemoglobin 7.2.  Continue iron and follow-up with primary care.  7. OSA (obstructive sleep apnea) He notes inadequate equipment at home as well as issues with his electrical grid.  Once he has those issues addressed, if he continues to have issues with his machine, I have asked him to contact us.  At that point, I would refer him to Dr. Radford Pax for further evaluation management of his sleep apnea. {Are you ordering a CV Procedure (e.g. stress test, cath, DCCV, TEE, etc)?   Press F2        :[id number]}     Dispo:  No follow-ups on file.   Medication Adjustments/Labs and Tests Ordered: Current medicines are reviewed at length with the patient today.  Concerns regarding medicines are outlined above.  Tests Ordered: No orders of the defined types were placed in this encounter.  Medication Changes: No orders of the defined types were placed in this encounter.   Signed, Richardson Dopp, PA-C  11/30/2020 10:11 AM    [facility] [address], [place], [place]  [address] Phone: [phone]; Fax: [phone]

## 2020-12-01 ENCOUNTER — Ambulatory Visit: Payer: Medicare HMO | Admitting: Physician Assistant

## 2020-12-01 DIAGNOSIS — N1831 Chronic kidney disease, stage 3a: Secondary | ICD-10-CM

## 2020-12-01 DIAGNOSIS — I5032 Chronic diastolic (congestive) heart failure: Secondary | ICD-10-CM

## 2020-12-01 DIAGNOSIS — I2510 Atherosclerotic heart disease of native coronary artery without angina pectoris: Secondary | ICD-10-CM

## 2020-12-01 DIAGNOSIS — I10 Essential (primary) hypertension: Secondary | ICD-10-CM

## 2020-12-01 DIAGNOSIS — D509 Iron deficiency anemia, unspecified: Secondary | ICD-10-CM

## 2020-12-01 DIAGNOSIS — Z8719 Personal history of other diseases of the digestive system: Secondary | ICD-10-CM

## 2020-12-01 DIAGNOSIS — I77810 Thoracic aortic ectasia: Secondary | ICD-10-CM

## 2020-12-01 DIAGNOSIS — K7460 Unspecified cirrhosis of liver: Secondary | ICD-10-CM

## 2020-12-02 NOTE — Chronic Care Management (AMB) (Addendum)
  Chronic Care Management   Note  12/02/2020 Name: [name] MRN: [id number] DOB: October 17, 1945  [name] is a 75 y.o. year old male who is a primary care patient of [name], MD. I reached out to [name] by phone today in response to a referral sent by [name] PCP, [name], MD     [name] was given information about Chronic Care Management services today including:  1. CCM service includes personalized support from designated clinical staff supervised by his physician, including individualized plan of care and coordination with other care providers 2. 24/7 contact phone numbers for assistance for urgent and routine care needs. 3. Service will only be billed when office clinical staff spend 20 minutes or more in a month to coordinate care. 4. Only one practitioner may furnish and bill the service in a calendar month. 5. The patient may stop CCM services at any time (effective at the end of the month) by phone call to the office staff. 6. The patient will be responsible for cost sharing (co-pay) of up to 20% of the service fee (after annual deductible is met).  Patient agreed to services and verbal consent obtained.   Follow up plan: Telephone appointment with care management team member scheduled for: 12/10/2020  [place] [facility] Management

## 2020-12-04 ENCOUNTER — Encounter: Payer: Self-pay | Admitting: Internal Medicine

## 2020-12-04 ENCOUNTER — Ambulatory Visit (INDEPENDENT_AMBULATORY_CARE_PROVIDER_SITE_OTHER): Payer: Medicare HMO | Admitting: Internal Medicine

## 2020-12-04 ENCOUNTER — Encounter (HOSPITAL_COMMUNITY): Payer: Self-pay

## 2020-12-04 ENCOUNTER — Emergency Department (HOSPITAL_COMMUNITY)
Admission: EM | Admit: 2020-12-04 | Discharge: 2020-12-04 | Disposition: A | Discharge status: Home / Self Care | Payer: Medicare HMO | Attending: Emergency Medicine | Admitting: Emergency Medicine

## 2020-12-04 ENCOUNTER — Telehealth: Payer: Self-pay

## 2020-12-04 ENCOUNTER — Other Ambulatory Visit: Payer: Self-pay

## 2020-12-04 VITALS — BP 134/64 | HR 75 | Temp 98.3°F | Resp 18 | Ht 68.0 in | Wt 222.8 lb

## 2020-12-04 DIAGNOSIS — J4520 Mild intermittent asthma, uncomplicated: Secondary | ICD-10-CM | POA: Insufficient documentation

## 2020-12-04 DIAGNOSIS — I130 Hypertensive heart and chronic kidney disease with heart failure and stage 1 through stage 4 chronic kidney disease, or unspecified chronic kidney disease: Secondary | ICD-10-CM | POA: Diagnosis not present

## 2020-12-04 DIAGNOSIS — Z79899 Other long term (current) drug therapy: Secondary | ICD-10-CM | POA: Diagnosis not present

## 2020-12-04 DIAGNOSIS — Z951 Presence of aortocoronary bypass graft: Secondary | ICD-10-CM | POA: Insufficient documentation

## 2020-12-04 DIAGNOSIS — I2510 Atherosclerotic heart disease of native coronary artery without angina pectoris: Secondary | ICD-10-CM | POA: Diagnosis not present

## 2020-12-04 DIAGNOSIS — I5030 Unspecified diastolic (congestive) heart failure: Secondary | ICD-10-CM | POA: Diagnosis not present

## 2020-12-04 DIAGNOSIS — K7031 Alcoholic cirrhosis of liver with ascites: Secondary | ICD-10-CM | POA: Diagnosis not present

## 2020-12-04 DIAGNOSIS — N183 Chronic kidney disease, stage 3 unspecified: Secondary | ICD-10-CM | POA: Insufficient documentation

## 2020-12-04 DIAGNOSIS — K921 Melena: Secondary | ICD-10-CM

## 2020-12-04 DIAGNOSIS — Z7984 Long term (current) use of oral hypoglycemic drugs: Secondary | ICD-10-CM | POA: Diagnosis not present

## 2020-12-04 DIAGNOSIS — E1122 Type 2 diabetes mellitus with diabetic chronic kidney disease: Secondary | ICD-10-CM | POA: Insufficient documentation

## 2020-12-04 DIAGNOSIS — D509 Iron deficiency anemia, unspecified: Secondary | ICD-10-CM | POA: Diagnosis not present

## 2020-12-04 DIAGNOSIS — D500 Iron deficiency anemia secondary to blood loss (chronic): Secondary | ICD-10-CM | POA: Diagnosis not present

## 2020-12-04 DIAGNOSIS — R69 Illness, unspecified: Secondary | ICD-10-CM | POA: Diagnosis not present

## 2020-12-04 DIAGNOSIS — D649 Anemia, unspecified: Secondary | ICD-10-CM | POA: Diagnosis not present

## 2020-12-04 DIAGNOSIS — Z87891 Personal history of nicotine dependence: Secondary | ICD-10-CM | POA: Diagnosis not present

## 2020-12-04 LAB — COMPREHENSIVE METABOLIC PANEL
ALT: 17 U/L (ref 0–53)
ALT: 21 U/L (ref 0–44)
AST: 24 U/L (ref 0–37)
AST: 27 U/L (ref 15–41)
Albumin: 3.9 g/dL (ref 3.5–5.2)
Albumin: 3.9 g/dL (ref 3.5–5.0)
Alkaline Phosphatase: 150 U/L — ABNORMAL HIGH (ref 39–117)
Alkaline Phosphatase: 145 U/L — ABNORMAL HIGH (ref 38–126)
Anion gap: 7 (ref 5–15)
BUN: 33 mg/dL — ABNORMAL HIGH (ref 6–23)
BUN: 31 mg/dL — ABNORMAL HIGH (ref 8–23)
CO2: 28 mEq/L (ref 19–32)
CO2: 25 mmol/L (ref 22–32)
Calcium: 9.1 mg/dL (ref 8.4–10.5)
Calcium: 8.8 mg/dL — ABNORMAL LOW (ref 8.9–10.3)
Chloride: 104 mEq/L (ref 96–112)
Chloride: 107 mmol/L (ref 98–111)
Creatinine, Ser: 1 mg/dL (ref 0.40–1.50)
Creatinine, Ser: 1 mg/dL (ref 0.61–1.24)
GFR, Estimated: >60 mL/min (ref >60)
GFR: 74.02 mL/min (ref >60.00)
Glucose, Bld: 117 mg/dL — ABNORMAL HIGH (ref 70–99)
Glucose, Bld: 125 mg/dL — ABNORMAL HIGH (ref 70–99)
Potassium: 4.5 mEq/L (ref 3.5–5.1)
Potassium: 4.4 mmol/L (ref 3.5–5.1)
Sodium: 138 mEq/L (ref 135–145)
Sodium: 139 mmol/L (ref 135–145)
Total Bilirubin: 0.5 mg/dL (ref 0.2–1.2)
Total Bilirubin: 0.7 mg/dL (ref 0.3–1.2)
Total Protein: 7.1 g/dL (ref 6.0–8.3)
Total Protein: 7.4 g/dL (ref 6.5–8.1)

## 2020-12-04 LAB — CBC WITH DIFFERENTIAL/PLATELET
Abs Immature Granulocytes: 0.01 10*3/uL (ref 0.00–0.07)
Basophils Absolute: 0 10*3/uL (ref 0.0–0.1)
Basophils Relative: 1 %
Eosinophils Absolute: 0.1 10*3/uL (ref 0.0–0.5)
Eosinophils Relative: 3 %
HCT: 26.5 % — ABNORMAL LOW (ref 39.0–52.0)
Hemoglobin: 7.7 g/dL — ABNORMAL LOW (ref 13.0–17.0)
Immature Granulocytes: 0 %
Lymphocytes Relative: 14 %
Lymphs Abs: 0.5 10*3/uL — ABNORMAL LOW (ref 0.7–4.0)
MCH: 23.8 pg — ABNORMAL LOW (ref 26.0–34.0)
MCHC: 29.1 g/dL — ABNORMAL LOW (ref 30.0–36.0)
MCV: 81.8 fL (ref 80.0–100.0)
Monocytes Absolute: 0.3 10*3/uL (ref 0.1–1.0)
Monocytes Relative: 7 %
Neutro Abs: 2.8 10*3/uL (ref 1.7–7.7)
Neutrophils Relative %: 75 %
Platelets: 147 10*3/uL — ABNORMAL LOW (ref 150–400)
RBC: 3.24 MIL/uL — ABNORMAL LOW (ref 4.22–5.81)
RDW: 22.5 % — ABNORMAL HIGH (ref 11.5–15.5)
WBC: 3.7 10*3/uL — ABNORMAL LOW (ref 4.0–10.5)
nRBC: 0 % (ref 0.0–0.2)

## 2020-12-04 LAB — CBC
HCT: 24.5 % — ABNORMAL LOW (ref 39.0–52.0)
Hemoglobin: 7.7 g/dL — CL (ref 13.0–17.0)
MCHC: 31.3 g/dL (ref 30.0–36.0)
MCV: 76.3 fl — ABNORMAL LOW (ref 78.0–100.0)
Platelets: 144 10*3/uL — ABNORMAL LOW (ref 150.0–400.0)
RBC: 3.21 Mil/uL — ABNORMAL LOW (ref 4.22–5.81)
RDW: 26 % — ABNORMAL HIGH (ref 11.5–15.5)
WBC: 4 10*3/uL (ref 4.0–10.5)

## 2020-12-04 LAB — TYPE AND SCREEN
ABO/RH(D): O POS
Antibody Screen: NEGATIVE

## 2020-12-04 MED ORDER — HYDROCORTISONE (PERIANAL) 2.5 % EX CREA: 1.0000 "application " | TOPICAL_CREAM | Freq: Two times a day (BID) | CUTANEOUS | 0 refills | Status: DC

## 2020-12-04 NOTE — ED Triage Notes (Signed)
Emergency Medicine Provider Triage Evaluation Note  [name] , a 75 y.o. male  was evaluated in triage.  Pt complains of low hemoglobin level.  He was called by his PCP today for hemoglobin being "slightly low."  States that he believes this was from "a little bit of bleeding from my hemorrhoid, but that's cleared right up."  He denies any chest pain, shortness of breath, lightheadedness or dizziness or abdominal pain.  Review of Systems  Positive: Bright red blood per rectum from hemorrhoids Negative: Chest pain, shortness of breath, lightheadedness or dizziness  Physical Exam  BP (!) 148/56 (BP Location: Left Arm)   Pulse 80   Temp 98.3 F (36.8 C) (Oral)   Resp 19   SpO2 99%  Gen:   Awake, no distress   HEENT:  Atraumatic  Resp:  Normal effort  Cardiac:  Normal rate  Abd:   Nondistended, nontender  MSK:   Moves extremities without difficulty  Neuro:  Speech clear   Medical Decision Making  Medically screening exam initiated at 5:54 PM.  Appropriate orders placed.  [name] was informed that the remainder of the evaluation will be completed by another provider, this initial triage assessment does not replace that evaluation, and the importance of remaining in the ED until their evaluation is complete.  Clinical Impression  Labs reviewed from today showed hemoglobin of 7.7.  Patient with history of anemia in the past.  He is hemodynamically stable here.  Will obtain labs and type and screen.   John Heady, PA-C 12/04/20 1809

## 2020-12-04 NOTE — Assessment & Plan Note (Signed)
Overall stable, no signs of encephalopathy or worsening ascites.

## 2020-12-04 NOTE — Assessment & Plan Note (Signed)
Checking CBC and CMP and adjust as needed.

## 2020-12-04 NOTE — ED Notes (Signed)
MD at bedside. 

## 2020-12-04 NOTE — Discharge Instructions (Signed)
As discussed with your anemia and is very important that you follow-up with your physician for appropriate ongoing outpatient management.  If you do develop new, or concerning changes do not hesitate to return here.

## 2020-12-04 NOTE — ED Triage Notes (Signed)
Patient was sent from the PCP office today for a Hgb-7.7. patient has a history of anemia.

## 2020-12-04 NOTE — Telephone Encounter (Signed)
I have called pt to give him the instruction to go to the ED since his hemoglobin is dropping again. His son picked up and they stated that they will go today. I stated understanding. FYI to provider.

## 2020-12-04 NOTE — Patient Instructions (Signed)
You can try zyrtec or claritin to help with the ears.   We are checking the blood work today.  Let us know if the miralax does not help with the bowels.

## 2020-12-04 NOTE — Progress Notes (Signed)
   Subjective:   Patient ID: [name], male    DOB: 1946-01-20, 75 y.o.   MRN: [id number]  HPI The patient is a 75 YO man coming in for hospital follow up (admitted for anemia, colonoscopy and endoscopy done without overt bleeding, some ectasias and stable varices). He is currently constipated and with some hemorrhoids due to straining. Has started taking miralax about 1-2 days ago but this has not helped yet. Denies abdominal pain. Denies leg swelling change or abdominal swelling change. Denies SOB or fatigue. Is getting around okay at home. Denies dark or black stools. Denies nausea or vomiting.   PMH, [facility], social history reviewed and updated  Review of Systems  Constitutional: Negative.   HENT: Negative.   Eyes: Negative.   Respiratory: Negative for cough, chest tightness and shortness of breath.   Cardiovascular: Positive for leg swelling. Negative for chest pain and palpitations.  Gastrointestinal: Positive for constipation. Negative for abdominal distention, abdominal pain, diarrhea, nausea and vomiting.       Hemorrhoids  Musculoskeletal: Negative.   Skin: Negative.   Neurological: Negative.   Psychiatric/Behavioral: Negative.     Objective:  Physical Exam Constitutional:      Appearance: He is well-developed.  HENT:     Head: Normocephalic and atraumatic.  Cardiovascular:     Rate and Rhythm: Normal rate and regular rhythm.  Pulmonary:     Effort: Pulmonary effort is normal. No respiratory distress.     Breath sounds: Normal breath sounds. No wheezing or rales.  Abdominal:     General: Bowel sounds are normal. There is distension.     Palpations: Abdomen is soft.     Tenderness: There is no abdominal tenderness. There is no rebound.     Comments: Normal bowel sounds, abdomen normal size  Musculoskeletal:     Cervical back: Normal range of motion.     Right lower leg: Edema present.     Left lower leg: Edema present.     Comments: 2+ edema bilateral, improved  from prior  Skin:    General: Skin is warm and dry.  Neurological:     Mental Status: He is alert and oriented to person, place, and time.     Coordination: Coordination abnormal.     Vitals:   12/04/20 1512  BP: 134/64  Pulse: 75  Resp: 18  Temp: 98.3 F (36.8 C)  TempSrc: Oral  SpO2: 98%  Weight: 222 lb 12.8 oz (101.1 kg)  Height: 5\' 8"  (1.727 m)    This visit occurred during the SARS-CoV-2 public health emergency.  Safety protocols were in place, including screening questions prior to the visit, additional usage of staff PPE, and extensive cleaning of exam room while observing appropriate contact time as indicated for disinfecting solutions.   Assessment & Plan:

## 2020-12-04 NOTE — Telephone Encounter (Signed)
Patient needs to go back to ER due to hemoglobin being critically low again;

## 2020-12-04 NOTE — ED Provider Notes (Signed)
[facility] DEPT Provider Note   CSN: [id number] Arrival date & time: 12/04/20  1722     History Chief Complaint  Patient presents with  . Abnormal Lab    [name] is a 75 y.o. male.  HPI Patient presents with primary care physician concern of anemia.  Patient self has no concerns, other than constipation, which he is beginning to take medication for.  He denies dyspnea, either with activity or rest, chest pain, similarly with activity or rest, lightheadedness, syncope, melena, hematemesis.  He does have a history of hemorrhoids, notes that he has occasionally had some episodes of small bleeding from that. However, he states that he is otherwise been in his usual state of health.     Past Medical History:  Diagnosis Date  . Arthritis    "joints tighten up sometimes" (03/27/2104)  . Carcinoma of liver, hepatocellular ([place]) 06/30/2020  . CHF (congestive heart failure) ([place])   . Chronic lower back pain   . Coronary artery disease    a. 05/2012 Cath/PCI: LM 40ost, 50-60d, LAD 99p ruptured plaque (3.0x28 DES), LCX 50p/m, RCA 30-40p, 69m EF 65-70%.  . Coronary artery disease involving native coronary artery of native heart without angina pectoris    Severe left main disease at catheterization July 2015  CABG x3 with a LIMA to the LAD, SVG to the OM, SVG to the PDA on 03/31/14. EF 60% by cath.   . Diastolic heart failure ([place]   . GERD without esophagitis 08/04/2010  . Hepatic cirrhosis ([place]    a. Dx 01/2014 - CT a/p   . History of blood transfusion    "related to bleeding ulcers"  . History of concussion    1976--  NO RESIDUAL  . History of GI bleed    a. UGIB 07/2012;  b. 01/2014 admission with GIB/FOB stool req 1U prbc's->EGD showed portal gastropathy, barrett's esoph, and chronic active h. pylori gastritis.  .Marland KitchenHistory of gout    2007 &  2008  LEFT LEG-- NO ISSUE SINCE  . Hyperlipidemia   . Iron deficiency anemia   . Kidney stones   . OA  (osteoarthritis of spine)    LOWER BACK--  INTERMITTANT LEFT LEG NUMBNESS  . OSA (obstructive sleep apnea)    PULMOLOGIST-  DR CLANCE--  MODERATE OSA  STARTED CPAP 2012--  BUT CURRENTLY HAS NOT USED PAST 6 MONTHS  . Phimosis    a. s/p circumcision 2015.  . Type 2 diabetes mellitus ([place]   . Unspecified essential hypertension     Patient Active Problem List   Diagnosis Date Noted  . GI bleeding 09/10/2020  . CKD (chronic kidney disease), stage II   . Hepatocellular carcinoma ([place] 08/09/2020  . Routine general medical examination at a health care facility 05/06/2020  . Iron deficiency anemia   . Symptomatic anemia 04/15/2020  . Pulmonary hypertension, unspecified ([place] 03/11/2020  . Asthma, mild intermittent 12/27/2019  . Alcoholic cirrhosis of liver with ascites ([place] 12/27/2019  . (HFpEF) heart failure with preserved ejection fraction ([facility] 12/24/2018  . CKD (chronic kidney disease), stage III ([place] 12/18/2017  . Peptic ulcer disease 12/18/2017  . GI bleed 09/29/2017  . Morbid obesity ([facility] 02/05/2016  . S/P CABG x 3 03/31/2014  . Hepatic cirrhosis ([facility] 02/27/2014  . Melena 08/14/2012  . Hyperlipidemia with target LDL less than 70 06/29/2012  . Thrombocytopenia ([place] 06/29/2012  . Coronary artery disease involving native coronary artery of native heart without angina pectoris   .  OSA (obstructive sleep apnea)   . Type 2 diabetes with complication ([place]) 87/56/4332  . GERD without esophagitis 08/04/2010  . History of gout   . Hypertensive heart disease     Past Surgical History:  Procedure Laterality Date  . ANKLE FRACTURE SURGERY Right 1989   "plate put in"  . APPENDECTOMY  05-16-2004   open  . CIRCUMCISION N/A 09/09/2013   Procedure: CIRCUMCISION ADULT;  Surgeon: Bernestine Amass, MD;  Location: [facility];  Service: Urology;  Laterality: N/A;  . COLECTOMY  05-16-2004  . COLONOSCOPY WITH PROPOFOL N/A 11/20/2020   Procedure: COLONOSCOPY WITH PROPOFOL;   Surgeon: Ronald Lobo, MD;  Location: [facility];  Service: Endoscopy;  Laterality: N/A;  . CORONARY ANGIOPLASTY WITH STENT PLACEMENT  06/28/2012  DR COOPER   PCI W/  X1 DES to [place]. LAD/  LM  40% OSTIAL & 50-60% DISTAL /  50% PROX LCX/  30-40% PROX RCA & 50% MID RCA/   LVEF 65-70%  . CORONARY ARTERY BYPASS GRAFT N/A 03/31/2014   Procedure: CORONARY ARTERY BYPASS GRAFTING (CABG) times 3 using left internal mammary artery and right saphenous vein.;  Surgeon: Melrose Nakayama, MD;  Location: [facility];  Service: Open Heart Surgery;  Laterality: N/A;  . DOBUTAMINE STRESS ECHO  06-08-2012   MODERATE HYPOKINESIS/ ISCHEMIA MID INFERIOR WALL  . ESOPHAGOGASTRODUODENOSCOPY  08/15/2012   Procedure: ESOPHAGOGASTRODUODENOSCOPY (EGD);  Surgeon: Wonda Horner, MD;  Location[facility] END[facility]COPY;  Service: Endoscopy;  Laterality: N/A;  . ESOPHAGOGASTRODUODENOSCOPY N/A 02/17/2014   Procedure: ESOPHAGOGASTRODUODENOSCOPY (EGD);  Surgeon: Jeryl Columbia, MD;  Location: [facility] ENDOSCOPY;  Service: Endoscopy;  Laterality: N/A;  . ESOPHAGOGASTRODUODENOSCOPY N/A 04/17/2020   Procedure: ESOPHAGOGASTRODUODENOSCOPY (EGD);  Surgeon: Ronnette Juniper, MD;  Location: [facility];  Service: Gastroenterology;  Laterality: N/A;  . ESOPHAGOGASTRODUODENOSCOPY N/A 09/11/2020   Procedure: ESOPHAGOGASTRODUODENOSCOPY (EGD);  Surgeon: Clarene Essex, MD;  Location[facility];  Service: Endoscopy;  Laterality: N/A;  . ESOPHAGOGASTRODUODENOSCOPY  10/09/2020  . ESOPHAGOGASTRODUODENOSCOPY N/A 10/09/2020   Procedure: ESOPHAGOGASTRODUODENOSCOPY (EGD);  Surgeon: Otis Brace, MD;  Location[facility] ENDOSCOPY;  Service: Gastroenterology;  Laterality: N/A;  . ESOPHAGOGASTRODUODENOSCOPY (EGD) WITH PROPOFOL N/A 09/30/2017   Procedure: ESOPHAGOGASTRODUODENOSCOPY (EGD) WITH PROPOFOL;  Surgeon: Wonda Horner, MD;  Location[facility] END[facility]COPY;  Service: Endoscopy;  Laterality: N/A;  . ESOPHAGOGASTRODUODENOSCOPY (EGD) WITH PROPOFOL N/A 12/20/2017   Procedure:  ESOPHAGOGASTRODUODENOSCOPY (EGD) WITH PROPOFOL;  Surgeon: Clarene Essex, MD;  Location: [facility];  Service: Endoscopy;  Laterality: N/A;  . ESOPHAGOGASTRODUODENOSCOPY (EGD) WITH PROPOFOL N/A 08/10/2020   Procedure: ESOPHAGOGASTRODUODENOSCOPY (EGD) WITH PROPOFOL;  Surgeon: Wilford Corner, MD;  Location: [facility];  Service: Endoscopy;  Laterality: N/A;  . ESOPHAGOGASTRODUODENOSCOPY (EGD) WITH PROPOFOL N/A 11/20/2020   Procedure: ESOPHAGOGASTRODUODENOSCOPY (EGD) WITH PROPOFOL;  Surgeon: Ronald Lobo, MD;  [facility];  Service: Endoscopy;  Laterality: N/A;  . GIVENS CAPSULE STUDY N/A 11/20/2020   Procedure: GIVENS CAPSULE STUDY;  Surgeon: Ronald Lobo, MD;  [facility];  Service: Endoscopy;  Laterality: N/A;  . HERNIA REPAIR    . INTRAOPERATIVE TRANSESOPHAGEAL ECHOCARDIOGRAM N/A 03/31/2014   Procedure: INTRAOPERATIVE TRANSESOPHAGEAL ECHOCARDIOGRAM;  Surgeon: Melrose Nakayama, MD;  Location: [facility];  Service: Open Heart Surgery;  Laterality: N/A;  . IR ANGIOGRAM SELECTIVE EACH ADDITIONAL VESSEL  06/16/2020  . IR ANGIOGRAM SELECTIVE EACH ADDITIONAL VESSEL  06/16/2020  . IR ANGIOGRAM SELECTIVE EACH ADDITIONAL VESSEL  06/16/2020  . IR ANGIOGRAM SELECTIVE EACH ADDITIONAL VESSEL  06/16/2020  . IR ANGIOGRAM SELECTIVE EACH ADDITIONAL VESSEL  06/16/2020  .  IR ANGIOGRAM SELECTIVE EACH ADDITIONAL VESSEL  06/16/2020  . IR ANGIOGRAM SELECTIVE EACH ADDITIONAL VESSEL  06/16/2020  . IR ANGIOGRAM SELECTIVE EACH ADDITIONAL VESSEL  06/16/2020  . IR ANGIOGRAM SELECTIVE EACH ADDITIONAL VESSEL  06/16/2020  . IR ANGIOGRAM SELECTIVE EACH ADDITIONAL VESSEL  06/30/2020  . IR ANGIOGRAM SELECTIVE EACH ADDITIONAL VESSEL  06/30/2020  . IR ANGIOGRAM VISCERAL SELECTIVE  06/16/2020  . IR ANGIOGRAM VISCERAL SELECTIVE  06/30/2020  . IR EMBO ARTERIAL NOT HEMORR HEMANG INC GUIDE ROADMAPPING  06/16/2020  . IR EMBO TUMOR ORGAN ISCHEMIA INFARCT INC GUIDE ROADMAPPING  06/30/2020  . IR RADIOLOGIST EVAL &  MGMT  05/06/2020  . IR RADIOLOGIST EVAL & MGMT  05/20/2020  . IR US GUIDE VASC ACCESS RIGHT  06/16/2020  . IR US GUIDE VASC ACCESS RIGHT  06/30/2020  . LAPAROSCOPIC UMBILICAL HERNIA REPAIR W/ MESH  06-06-2011  . LEFT HEART CATHETERIZATION WITH CORONARY ANGIOGRAM N/A 03/28/2014   Procedure: LEFT HEART CATHETERIZATION WITH CORONARY ANGIOGRAM;  Surgeon: Sinclair Grooms, MD;  [facility] LAB;  Service: Cardiovascular;  Laterality: N/A;  . LIPOMA EXCISION Left 08/25/2017   Procedure: EXCISION LIPOMA LEFT POSTERIOR THIGH;  Surgeon: Clovis Riley, MD;  Location: [facility];  Service: General;  Laterality: Left;  . NEPHROLITHOTOMY  1990'S  . OPEN APPENDECTOMY W/ PARTIAL CECECTOMY  05-16-2004  . PERCUTANEOUS CORONARY STENT INTERVENTION (PCI-S) N/A 06/28/2012   Procedure: PERCUTANEOUS CORONARY STENT INTERVENTION (PCI-S);  Surgeon: Sherren Mocha, MD;  Location: [facility] CATH LAB;  Service: Cardiovascular;  Laterality: N/A;  . POLYPECTOMY  11/20/2020   Procedure: POLYPECTOMY;  Surgeon: Ronald Lobo, MD;  [facility];  Service: Endoscopy;;       Family History  Problem Relation Age of Onset  . Lung cancer Sister   . Cancer Sister        lung  . Cancer Mother   . Cancer Father        died in his 43s.  . Cancer Brother        lung  . Coronary artery disease Other   . Diabetes Other   . Colon cancer Other   . Cancer Sister        lung    Social History   Tobacco Use  . Smoking status: Former Smoker    Packs/day: 2.00    Years: 40.00    Pack years: 80.00    Types: Cigarettes    Quit date: 08/29/1996    Years since quitting: 24.2  . Smokeless tobacco: Never Used  Vaping Use  . Vaping Use: Never used  Substance Use Topics  . Alcohol use: Yes    Alcohol/week: 8.0 standard drinks    Types: 8 Cans of beer per week    Comment: 2 beers every other day  . Drug use: No    Home Medications Prior to Admission medications   Medication Sig Start Date End Date Taking? Authorizing  Provider  Accu-Chek Softclix Lancets lancets USE AS DIRECTED TO TEST BLOOD SUGAR FOUR TIMES DAILY 03/13/20   [name], MD  albuterol (VENTOLIN HFA) 108 (90 Base) MCG/ACT inhaler Inhale 2 puffs into the lungs every 6 (six) hours as needed for wheezing or shortness of breath. 03/12/20   [name], MD  atorvastatin (LIPITOR) 40 MG tablet TAKE 1 TABLET BY MOUTH DAILY AT 6 PM Patient taking differently: Take 40 mg by mouth daily. 09/30/20   [name], MD  blood glucose meter kit and supplies Dispense based on  patient and insurance preference. Use up to four times daily as directed. (FOR ICD-10 E10.9, E11.9). 03/12/20   [name], MD  gabapentin (NEURONTIN) 100 MG capsule TAKE 1 CAPSULE(100 MG) BY MOUTH TWICE DAILY Patient taking differently: Take 100 mg by mouth 2 (two) times daily. 08/03/20   [name], MD  glimepiride (AMARYL) 2 MG tablet Take 2 mg by mouth daily. 07/13/20   [provider]  hydrocortisone (ANUSOL-HC) 2.5 % rectal cream Place 1 application rectally 2 (two) times daily. 12/04/20   [name], MD  metFORMIN (GLUCOPHAGE) 850 MG tablet TAKE 1 TABLET(850 MG) BY MOUTH TWICE DAILY WITH A MEAL Patient taking differently: Take 850 mg by mouth 2 (two) times daily with a meal. 06/16/20   [name], MD  nitroGLYCERIN (NITROSTAT) 0.4 MG SL tablet Place 1 tablet (0.4 mg total) under the tongue every 5 (five) minutes as needed for chest pain. 06/08/16   Nahser, Wonda Cheng, MD  pantoprazole (PROTONIX) 40 MG tablet Take 1 tablet (40 mg total) by mouth 2 (two) times daily. 09/12/20   Pokhrel, Corrie Mckusick, MD  potassium chloride (KLOR-CON) 20 MEQ tablet Take 1 tablet (20 mEq total) by mouth daily. 08/18/20   Danford, Suann Larry, MD  Psyllium (METAMUCIL PO) Take 15 mLs by mouth 3 (three) times daily with meals. Mix with 4 oz of juice or water    [provider]  spironolactone (ALDACTONE) 25 MG tablet Take 1 tablet (25  mg total) by mouth daily. 08/18/20   Danford, Suann Larry, MD  sucralfate (CARAFATE) 1 g tablet Take 1 tablet (1 g total) by mouth 4 (four) times daily. 11/23/20 12/23/20  Elodia Florence., MD  torsemide (DEMADEX) 20 MG tablet Take 2 tablets (40 mg total) by mouth 2 (two) times daily. 08/18/20   Danford, Suann Larry, MD  umeclidinium-vilanterol (ANORO ELLIPTA) 62.5-25 MCG/INH AEPB Inhale 1 puff into the lungs daily. 03/12/20   [name], MD    Allergies    Other  Review of Systems   Review of Systems  Constitutional:       Per HPI, otherwise negative  HENT:       Per HPI, otherwise negative  Respiratory:       Per HPI, otherwise negative  Cardiovascular:       Per HPI, otherwise negative  Gastrointestinal: Negative for vomiting.  Endocrine:       Negative aside from HPI  Genitourinary:       Neg aside from HPI   Musculoskeletal:       Per HPI, otherwise negative  Skin: Negative.   Neurological: Negative for syncope and weakness.    Physical Exam Updated Vital Signs BP (!) 127/49   Pulse 71   Temp 98.3 F (36.8 C) (Oral)   Resp 13   Ht '5\' 8"'  (1.727 m)   Wt 101.1 kg   SpO2 100%   BMI 33.88 kg/m   Physical Exam Vitals and nursing note reviewed.  Constitutional:      General: He is not in acute distress.    Appearance: He is well-developed.  HENT:     Head: Normocephalic and atraumatic.  Eyes:     Conjunctiva/sclera: Conjunctivae normal.  Cardiovascular:     Rate and Rhythm: Normal rate and regular rhythm.  Pulmonary:     Effort: Pulmonary effort is normal. No respiratory distress.     Breath sounds: No stridor.  Abdominal:     General: There is no distension.  Palpations: There is no mass.     Tenderness: There is no abdominal tenderness. There is no guarding or rebound.  Skin:    General: Skin is warm and dry.  Neurological:     Mental Status: He is alert and oriented to person, place, and time.     ED Results / Procedures /  Treatments   Labs (all labs ordered are listed, but only abnormal results are displayed) Labs Reviewed  COMPREHENSIVE METABOLIC PANEL - Abnormal; Notable for the following components:      Result Value   Glucose, Bld 125 (*)    BUN 31 (*)    Calcium 8.8 (*)    Alkaline Phosphatase 145 (*)    All other components within normal limits  CBC WITH DIFFERENTIAL/PLATELET - Abnormal; Notable for the following components:   WBC 3.7 (*)    RBC 3.24 (*)    Hemoglobin 7.7 (*)    HCT 26.5 (*)    MCH 23.8 (*)    MCHC 29.1 (*)    RDW 22.5 (*)    Platelets 147 (*)    Lymphs Abs 0.5 (*)    All other components within normal limits  POC OCCULT BLOOD, ED  TYPE AND SCREEN    EKG None  Radiology No results found.  Procedures Procedures   Medications Ordered in ED Medications - No data to display  ED Course  I have reviewed the triage vital signs and the nursing notes.  Pertinent labs & imaging results that were available during my care of the patient were reviewed by me and considered in my medical decision making (see chart for details).  Patient's labs reviewed, notable for hemoglobin of 7.7, though with other findings consistent with microcytic anemia suggesting chronic process.  Here patient has no hypotension, no tachycardia, minimal actual complaints beyond mild constipation.  No evidence for acute exsanguination, no evidence for hemorrhagic shock, nor indication for transfusion.  I discussed the patient's hemoglobin value with him, and he again reiterated he has no new complaints.  We discussed transfusion for possible symptomatic anemia, but without any true symptoms, patient agreed to follow-up with his primary care physician in the coming days, for additional consideration. Patient discharged in stable condition. Final Clinical Impression(s) / ED Diagnoses Final diagnoses:  Microcytic anemia      Carmin Muskrat, MD 12/04/20 2124

## 2020-12-04 NOTE — Assessment & Plan Note (Signed)
Resolved per patient.

## 2020-12-04 NOTE — Telephone Encounter (Signed)
I have received a critical for pt from Lab, [facility].   Pt's Hemoglobin is dropped to 7.7

## 2020-12-10 ENCOUNTER — Ambulatory Visit (INDEPENDENT_AMBULATORY_CARE_PROVIDER_SITE_OTHER): Payer: Medicare HMO | Admitting: *Deleted

## 2020-12-10 DIAGNOSIS — D649 Anemia, unspecified: Secondary | ICD-10-CM | POA: Diagnosis not present

## 2020-12-10 DIAGNOSIS — I5032 Chronic diastolic (congestive) heart failure: Secondary | ICD-10-CM

## 2020-12-11 NOTE — Chronic Care Management (AMB) (Signed)
Chronic Care Management   CCM RN Visit Note  12/11/2020 Name: RADIN RAPTIS MRN: [id number] DOB: 1945/09/13  Subjective: [name] is a 75 y.o. year old male who is a primary care patient of [name], MD. The care management team was consulted for assistance with disease management and care coordination needs.    Engaged with patient/ caregiver- son [name], on [facility] DPR by telephone for initial visit in response to provider referral for case management and/or care coordination services.   Consent to Services:  The patient was given information about Chronic Care Management services, agreed to services, and gave verbal consent prior to initiation of services on 11/24/20.  Please see initial visit note for detailed documentation. Patient agreed to services and verbal consent obtained.   Assessment: Review of patient past medical history, allergies, medications, health status, including review of consultants reports, laboratory and other test data, was performed as part of comprehensive evaluation and provision of chronic care management services.   SDOH (Social Determinants of Health) assessments and interventions performed:  SDOH Interventions   Flowsheet Row Most Recent Value  SDOH Interventions   Food Insecurity Interventions Intervention Not Indicated  Financial Strain Interventions Other (Comment)  [facility] referral placed- patient/ caregiver report difficulty affording inhalers and "another medication" they are unable to recall today because they have not refilled it recently: advised to have information available prior to pharmacist outreach]  Transportation Interventions Intervention Not Indicated      CCM Care Plan  Allergies  Allergen Reactions  . Other     Pt states "I am not taking anymore flu shots. It liked to have killed me."    Outpatient Encounter Medications as of 12/10/2020  Medication Sig Note  . albuterol (VENTOLIN HFA) 108 (90 Base) MCG/ACT  inhaler Inhale 2 puffs into the lungs every 6 (six) hours as needed for wheezing or shortness of breath.   Marland Kitchen atorvastatin (LIPITOR) 40 MG tablet TAKE 1 TABLET BY MOUTH DAILY AT 6 PM (Patient taking differently: Take 40 mg by mouth daily.)   . gabapentin (NEURONTIN) 100 MG capsule TAKE 1 CAPSULE(100 MG) BY MOUTH TWICE DAILY (Patient taking differently: Take 100 mg by mouth 2 (two) times daily.)   . glimepiride (AMARYL) 2 MG tablet Take 2 mg by mouth daily.   . hydrocortisone (ANUSOL-HC) 2.5 % rectal cream Place 1 application rectally 2 (two) times daily. 12/10/2020: 12/10/20:  Reports has not needed recently  . metFORMIN (GLUCOPHAGE) 850 MG tablet TAKE 1 TABLET(850 MG) BY MOUTH TWICE DAILY WITH A MEAL (Patient taking differently: Take 850 mg by mouth 2 (two) times daily with a meal.)   . nitroGLYCERIN (NITROSTAT) 0.4 MG SL tablet Place 1 tablet (0.4 mg total) under the tongue every 5 (five) minutes as needed for chest pain. 12/10/2020: 12/10/20: reports has not needed recently   . potassium chloride (KLOR-CON) 20 MEQ tablet Take 1 tablet (20 mEq total) by mouth daily.   . Psyllium (METAMUCIL PO) Take 15 mLs by mouth 3 (three) times daily with meals. Mix with 4 oz of juice or water   . spironolactone (ALDACTONE) 25 MG tablet Take 1 tablet (25 mg total) by mouth daily.   . sucralfate (CARAFATE) 1 g tablet Take 1 tablet (1 g total) by mouth 4 (four) times daily. 12/10/2020: 12/10/20: Caregiver/ son reports taking only QD- advised to increase per Rx instructions  . torsemide (DEMADEX) 20 MG tablet Take 2 tablets (40 mg total) by mouth 2 (two) times daily.   Marland Kitchen  umeclidinium-vilanterol (ANORO ELLIPTA) 62.5-25 MCG/INH AEPB Inhale 1 puff into the lungs daily.   . Accu-Chek Softclix Lancets lancets USE AS DIRECTED TO TEST BLOOD SUGAR FOUR TIMES DAILY   . blood glucose meter kit and supplies Dispense based on patient and insurance preference. Use up to four times daily as directed. (FOR ICD-10 E10.9, E11.9).   .  pantoprazole (PROTONIX) 40 MG tablet Take 1 tablet (40 mg total) by mouth 2 (two) times daily. (Patient not taking: Reported on 12/10/2020) 12/10/2020: Son/ caregiver reports has not been taking 12/10/20   No facility-administered encounter medications on file as of 12/10/2020.    Patient Active Problem List   Diagnosis Date Noted  . GI bleeding 09/10/2020  . CKD (chronic kidney disease), stage II   . Hepatocellular carcinoma ([place]) 08/09/2020  . Routine general medical examination at a health care facility 05/06/2020  . Iron deficiency anemia   . Symptomatic anemia 04/15/2020  . Pulmonary hypertension, unspecified ([place]) 03/11/2020  . Asthma, mild intermittent 12/27/2019  . Alcoholic cirrhosis of liver with ascites ([place]) 12/27/2019  . (HFpEF) heart failure with preserved ejection fraction ([place]) 12/24/2018  . CKD (chronic kidney disease), stage III ([place]) 12/18/2017  . Peptic ulcer disease 12/18/2017  . GI bleed 09/29/2017  . Morbid obesity ([place]) 02/05/2016  . S/P CABG x 3 03/31/2014  . Hepatic cirrhosis ([place]) 02/27/2014  . Melena 08/14/2012  . Hyperlipidemia with target LDL less than 70 06/29/2012  . Thrombocytopenia ([place]) 06/29/2012  . Coronary artery disease involving native coronary artery of native heart without angina pectoris   . OSA (obstructive sleep apnea)   . Type 2 diabetes with complication ([place]) 34/28/7681  . GERD without esophagitis 08/04/2010  . History of gout   . Hypertensive heart disease     Conditions to be addressed/monitored:CHF and Frequent recurrent GI bleeding/ anemia requiring hospitalization  Care Plan : Heart Failure (Adult)  Updates made by Knox Royalty, RN since 12/11/2020 12:00 AM    Problem: Symptom Exacerbation (Heart Failure)   Priority: Medium    Long-Range Goal: Symptom Exacerbation Prevented or Minimized   Start Date: 12/10/2020  Expected End Date: 06/12/2021  This Visit's Progress: On track  Priority: Medium  Note:   Current Barriers:   Marland Kitchen Knowledge deficits related to basic heart failure pathophysiology and self care management . Unable to independently read, write, understand health conditions and advice- relies on family to manage health needs/ affairs . Does not adhere to provider recommendations re: daily weight monitoring/ recording  . Financial strain- reports difficulty affording "some medications:" CCM Pharmacy referral placed  Nurse Case Manager Clinical Goal(s):  Over the next 6 months, patient/ caregiver will: Marland Kitchen Verbalize consistent adherence to daily weight monitoring/ recording . Verbalize understanding of Heart Failure Action Plan; when to call care providers vs seek emergent/ urgent care . Take all mediations as prescribed/ work with CCM pharmacy team to address states pharmacy needs Interventions:  . Collaboration with [name], MD regarding development and update of comprehensive plan of care as evidenced by provider attestation and co-signature . Inter-disciplinary care team collaboration (see longitudinal plan of care) . Chart reviewed including relevant office/ hospital notes, recent/ upcoming scheduled appointments, and lab results . Initiated RN CM initial assessment and confirmed inconsistent daily weight monitoring/ recording at home; basic overview and discussion of pathophysiology of Heart Failure with patient and encouraged him to begin consistent  monitoring/ recording of daily weights at home.  Provided verbal education and importance/ rationale for daily  weight monitoring/ recording at home- patient reports he "has heard this before" and confirms his caregiver/ son "stays on top" of him to do . Provided education around signs/ symptoms yellow CHF zone/ weight gain guidelines in setting of CHF along with corresponding action plan:  patient will require ongoing reinforcement around same . Confirmed caregiver able to verbalize good general understanding of weight gain guidelines and  corresponding action plan for same . Reviewed recent weights at home with patient: patient reports general weight ranges at home between 220-223 lbs since last hospital discharge; reports last weight obtained was "222 lbs" at PCP office visit 12/04/20 . Confirmed follows low salt diet- positive reinforcement provided . Discussed/ provided verbal education around use of diuretics at home, need to balance use of "extra" use of diuretic for development of signs/ symptoms CHF exacerbation with prevention of dehydration: discussed ongoing use of potassium and provided education of importance of potassium . Discussed/ provided education around safe use of NTG at home; confirmed patient has not needed recently  Discussed plans with patient for ongoing care management follow up and provided patient with direct contact information for care management team Self-Care Activities:  . Takes Heart Failure Medications as prescribed under direction of family caregivers . Inconsistently monitors daily weights at home: reports weighs "a couple of times every week" . Caregiver able to verbalize action plan to notify MD of 3 lb weight gain over night or 5 lb in a week- caregiver works and tries to review patient's weights regularly . Adheres to low sodium diet  Patient Goals:  . Mamie Levers myself daily  . Call office if I gain more than 2 pounds in one day or 5 pounds in one week . Track daily weight in diary or on a calendar so you can easily keep up with weight gain from day to day . Continue using salt in moderation . Watch for swelling in feet, ankles and legs every day Follow Up Plan:  . Telephone follow up appointment with caregiver/ care management team member scheduled for: Jan 07, 2021 at 3:30 pm . The patient has been provided with contact information for the care management team and has been advised to call with any health related questions or concerns.     Care Plan : General Plan of Care (Adult)  Updates  made by Knox Royalty, RN since 12/11/2020 12:00 AM    Problem: Anemia/ GI bleeding- Health Promotion or Disease Self-Management (General Plan of Care)   Priority: Medium    Long-Range Goal: Self-Management Plan Developed   Start Date: 12/10/2020  Expected End Date: 06/12/2021  This Visit's Progress: On track  Priority: Medium  Note:   Current Barriers:   Ineffective Self Health Maintenance of recurrent GI bleeding/ anemia  Unable to independently read, write, reports difficulty understanding health problems/ advice- relies on family members to help; family handles all of patient's health activities, including medication administration at home  Reports issues with memory Clinical Goal(s):  Marland Kitchen Collaboration with [name], MD regarding development and update of comprehensive plan of care as evidenced by provider attestation and co-signature . Inter-disciplinary care team collaboration (see longitudinal plan of care) Over the next 6 months, patient/ caregiver will: . work with care management team to address care coordination and chronic disease management needs related to frequent recurrent episodes GI bleeding requiring hospitalization . care coordination needs for: medication reconciliation and possible medication assistance    Interventions:   Evaluation of current treatment plan related to ,  Medication procurement, Literacy concerns, and Memory Deficits self-management and patient's adherence to plan as established by provider.  Collaboration with [name], MD regarding development and update of comprehensive plan of care as evidenced by provider attestation       and co-signature  Inter-disciplinary care team collaboration (see longitudinal plan of care)  Chart reviewed including relevant office and hospital notes, upcoming scheduled appointments, and lab results  Discussed current  clinical condition with patient/ caregiver and confirmed no current  clinical or medication concerns; confirmed patient's family/ caregiver manages all of patient's health affairs and  medications at home and report adherence to medication regimen  Full medication completed with caregiver today: discovered several medication discrepancies, including 1) caregiver reports administering carafate QD (prescription order instructs QID): careiver reports did not know to give QID: purpose/ importance of medication in setting of anemia/ recurrent GI bleeding discussed with caregiver- advised that he begin adminstering QID as prescribed- caregiver verbalizes understanding and states will do; 2) reports patient currently not taking protonix, although this is listed as a current medication: caregiver reports he "thinks" this medication was discontinued during one of patient's recent hospital visits and not removed from his medication list; 3) difficulty affording anoro ellipta inhaler and "another medicine" which caregiver is unable to recall today, stating they have not had to refill it recently, so he cant remember the name of the medicine; 4) confirmed patient has been advised to stop iron supplement- caregiver reports "makes him get constipated and then has caused bleeding from constipation."  Discussed with caregiver that CCM services include pharmacy team and he is agreeable to my placing request with CCM pharmacist to outreach caregiver to address medication adherence, possible need for financial assistance; encouraged caregiver to try and determine which of the "other" medications he reports they have difficulty affording so he can share with CCM pharmacy at time of outreach; encouraged caregiver's engagement with CCM pharmacy team- referral message sent to [facility] Pharmacist for scheduling  Confirmed that caregiver and patient "attended" post- recent hospital discharge office visit with GI provider, "Dr. Jacqualyn Posey;" states patient is to return to GI provider only "as needed;" reviewed  recent post-hospital discharge office visit with PCP on 12/04/20- confirmed no changes made to medications/ general plan of care  Discussed/ provided verbal and printed education signs/ symptoms GI bleeding/ acute anemia and action plan to promptly notify care providers for concerns, development of signs/ symptoms; discussed reasons to call providers vs seek emergent/ urgent care  Confirmed patient continues to endorse following low salt, heart healthy diet, caregiver states "we try to make sure he eats bland food"  Discussed insurance provider benefit of food/ meal provision post-hospitalization: patient reports he was told by hospital staff that he "qualifies" for this but has not yet received anything from his insurance company- encouraged him to follow up with insurance company- he declines needing assistance with this today  Reviewed upcoming provider appointments with patient/ caregiver: confirmed no upcoming scheduled appointments- encouraged patient/ caregiver to stay on top of routine appointments  Discussed plans with patient for ongoing care management follow up and provided patient with direct contact information for care management team Self Care Activities:  . Patient/ caregiver verbalizes understanding of plan to promptly notify care provider of development of signs/ symptoms GI bleeding . Attends all scheduled provider appointments- confirmed son provides transportation Patient Goals: Marland Kitchen Discuss my treatment options with the doctor or nurse . Make shared treatment decisions with doctor  . Know the signs/ symptoms  of GI (stomach- gastrointestinal) bleeding: if you develop these signs and symptoms, promptly contact you care providers so you can hopefully avoid having to go back to the hospital . Please have your family members read over the attached educational information with you- it is important to take action early when you develop signs/ symptoms of bleeding Follow Up Plan:   . Telephone follow up appointment with caregiver/ son [name] and care management team member scheduled for: Jan 07, 2021 at 3:30 pm . The patient has been provided with contact information for the care management team and has been advised to call with any health related questions or concerns.       Plan:  Telephone follow up appointment with caregiver/ son [name]/ care management team member scheduled for:  Jan 07, 2021 at 3:30 pm  The patient has been provided with contact information for the care management team and has been advised to call with any health related questions or concerns.   Oneta Rack, RN, BSN, [facility] RN Care Coordination- [place] [phone]: direct office [phone]: mobile

## 2020-12-11 NOTE — Patient Instructions (Signed)
Visit Information   [name] and [name]-- it was nice talking with you today.   Please read over the attached information, and start now to monitor and write down your daily weights at home.  I have asked the Care Management Pharmacist contact [name] about obtaining possible patient assistance with the medications you said were difficult to afford- please listen out for the phone call from the pharmacist   I look forward to talking to you again for an update on Jan 07, 2021 at 3:30 pm- please be listening out for my call that day.  I will call as close to 3:30 pm as possible; I look forward to hearing about your progress.   Please don't hesitate to contact me if I can be of assistance to you before our next scheduled appointment.   Oneta Rack, RN, BSN, [facility] RN Care Coordination- [place] [phone]: direct office [phone]: mobile     PATIENT GOALS:  Goals Addressed            This Visit's Progress   . "Keep my blood count in normal limits so I don't have to go back to the hospital"   On track    Timeframe:  Long-Range Goal Priority:  High Start Date:       12/10/20                       Expected End Date:     06/12/21                  Follow Up Date 01/07/21   . Discuss my treatment options with the doctor or nurse . Make shared treatment decisions with doctor  . Know the signs/ symptoms of GI (stomach- gastrointestinal) bleeding: if you develop these signs and symptoms, promptly contact you care providers so you can hopefully avoid having to go back to the hospital . Please have your family members read over the attached educational information with you- it is important to take action early when you develop signs/ symptoms of bleeding   Why is this important?    Having a long-term illness can be scary.   It can also be stressful for you and your caregiver.   These steps may help.        . Track and Manage Fluids and  Swelling-Heart Failure   On track    Timeframe:  Long-Range Goal Priority:  Medium Start Date:        12/10/20                     Expected End Date:   06/12/21                    Follow Up Date 01/07/21   . Weigh myself daily  . Call office if I gain more than 2 pounds in one day or 5 pounds in one week . Track daily weight in diary or on a calendar so you can easily keep up with weight gain from day to day . Continue using salt in moderation . Watch for swelling in feet, ankles and legs every day  Why is this important?    It is important to check your weight daily and watch how much salt and liquids you have.   It will help you to manage your heart failure.           Gastrointestinal Bleeding Gastrointestinal (GI)  bleeding is bleeding somewhere along the path that food travels through the body (digestive tract). This path is anywhere between the mouth and the opening of the butt (anus). You may have blood in your poop (stool) or have black poop. If you throw up (vomit), there may be blood in it. This condition can be mild, serious, or even life-threatening. If you have a lot of bleeding, you may need to stay in the hospital. What are the causes? This condition may be caused by:  Irritation and swelling of the esophagus (esophagitis). The esophagus is part of the body that moves food from your mouth to your stomach.  Swollen veins in the butt (hemorrhoids).  Areas of painful tearing in the opening of the butt (anal fissures). These are often caused by passing hard poop.  Pouches that form on the colon over time (diverticulosis).  Irritation and swelling (diverticulitis) in areas where pouches have formed on the colon.  Growths (polyps) or cancer. Colon cancer often starts out as growths that are not cancer.  Irritation of the stomach lining (gastritis).  Sores (ulcers) in the stomach. What increases the risk? You are more likely to develop this condition if you:  Have  a certain type of infection in your stomach (Helicobacter pylori infection).  Take certain medicines.  Smoke.  Drink alcohol. What are the signs or symptoms? Common symptoms of this condition include:  Throwing up (vomiting) material that has bright red blood in it. It may look like coffee grounds.  Changes in your poop. The poop may: ? Have red blood in it. ? Be black, look like tar, and smell stronger than normal. ? Be red.  Pain or cramping in the belly (abdomen). How is this treated? Treatment for this condition depends on the cause of the bleeding. For example:  Sometimes, the bleeding can be stopped during a procedure that is done to find the problem (endoscopy or colonoscopy).  Medicines can be used to: ? Help control irritation, swelling, or infection. ? Reduce acid in your stomach.  Certain problems can be treated with: ? Creams. ? Medicines that are put in the butt (suppositories). ? Warm baths.  Surgery is sometimes needed.  If you lose a lot of blood, you may need a blood transfusion. If bleeding is mild, you may be allowed to go home. If there is a lot of bleeding, you will need to stay in the hospital. Follow these instructions at home:  Take over-the-counter and prescription medicines only as told by your doctor.  Eat foods that have a lot of fiber in them. These foods include beans, whole grains, and fresh fruits and vegetables. You can also try eating 1-3 prunes each day.  Drink enough fluid to keep your pee (urine) pale yellow.  Keep all follow-up visits as told by your doctor. This is important.   Contact a doctor if:  Your symptoms do not get better. Get help right away if:  Your bleeding does not stop.  You feel dizzy or you pass out (faint).  You feel weak.  You have very bad cramps in your back or belly.  You pass large clumps of blood (clots) in your poop.  Your symptoms are getting worse.  You have chest pain or fast  heartbeats. Summary  GI bleeding is bleeding somewhere along the path that food travels through the body (digestive tract).  This bleeding can be caused by many things. Treatment depends on the cause of the bleeding.  Take medicines  only as told by your doctor.  Keep all follow-up visits as told by your doctor. This is important. This information is not intended to replace advice given to you by your health care provider. Make sure you discuss any questions you have with your health care provider. Document Revised: 03/28/2018 Document Reviewed: 03/28/2018 Elsevier Patient Education  2021 [employer].   Heart Failure Action Plan A heart failure action plan helps you understand what to do when you have symptoms of heart failure. Your action plan is a color-coded plan that lists the symptoms to watch for and indicates what actions to take.  If you have symptoms in the red zone, you need medical care right away.  If you have symptoms in the yellow zone, you are having problems.  If you have symptoms in the green zone, you are doing well. Follow the plan that was created by you and your health care provider. Review your plan each time you visit your health care provider. Red zone These signs and symptoms mean you should get medical help right away:  You have trouble breathing when resting.  You have a dry cough that is getting worse.  You have swelling or pain in your legs or abdomen that is getting worse.  You suddenly gain more than 2-3 lb (0.9-1.4 kg) in 24 hours, or more than 5 lb (2.3 kg) in a week. This amount may be more or less depending on your condition.  You have trouble staying awake or you feel confused.  You have chest pain.  You do not have an appetite.  You pass out.  You have worsening sadness or depression. If you have any of these symptoms, call your local emergency services (911 in the [place] right away. Do not drive yourself to the hospital.   Yellow  zone These signs and symptoms mean your condition may be getting worse and you should make some changes:  You have trouble breathing when you are active, or you need to sleep with your head raised on extra pillows to help you breathe.  You have swelling in your legs or abdomen.  You gain 2-3 lb (0.9-1.4 kg) in 24 hours, or 5 lb (2.3 kg) in a week. This amount may be more or less depending on your condition.  You get tired easily.  You have trouble sleeping.  You have a dry cough. If you have any of these symptoms:  Contact your health care provider within the next day.  Your health care provider may adjust your medicines.   Green zone These signs mean you are doing well and can continue what you are doing:  You do not have shortness of breath.  You have very little swelling or no new swelling.  Your weight is stable (no gain or loss).  You have a normal activity level.  You do not have chest pain or any other new symptoms.   Follow these instructions at home:  Take over-the-counter and prescription medicines only as told by your health care provider.  Weigh yourself daily. Your target weight is __________ lb (__________ kg). ? Call your health care provider if you gain more than __________ lb (__________ kg) in 24 hours, or more than __________ lb (__________ kg) in a week. ? Health care provider name: _____________________________________________________ ? Health care provider phone number: _____________________________________________________  Eat a heart-healthy diet. Work with a diet and nutrition specialist (dietitian) to create an eating plan that is best for you.  Keep all follow-up  visits. This is important. Where to find more information  American Heart Association: www.heart.org Summary  A heart failure action plan helps you understand what to do when you have symptoms of heart failure.  Follow the action plan that was created by you and your health care  provider.  Get help right away if you have any symptoms in the red zone. This information is not intended to replace advice given to you by your health care provider. Make sure you discuss any questions you have with your health care provider. Document Revised: 03/30/2020 Document Reviewed: 03/30/2020 Elsevier Patient Education  2021 [employer].   Consent to CCM Services: Mr. Rodgers was given information about Chronic Care Management services today including:  1. CCM service includes personalized support from designated clinical staff supervised by his physician, including individualized plan of care and coordination with other care providers 2. 24/7 contact phone numbers for assistance for urgent and routine care needs. 3. Service will only be billed when office clinical staff spend 20 minutes or more in a month to coordinate care. 4. Only one practitioner may furnish and bill the service in a calendar month. 5. The patient may stop CCM services at any time (effective at the end of the month) by phone call to the office staff. 6. The patient will be responsible for cost sharing (co-pay) of up to 20% of the service fee (after annual deductible is met).  Patient agreed to services and verbal consent obtained.    The patient verbalized understanding of instructions, educational materials, and care plan provided today and agreed to receive a mailed copy of patient instructions, educational materials, and care plan. Mail request sent 12/11/20  Telephone follow up appointment with care management team member scheduled for: Jan 07, 2021 at 3:30 pm  The patient has been provided with contact information for the care management team and has been advised to call with any health related questions or concerns.   Oneta Rack, RN, BSN, [facility] RN Care Coordination- [place] [phone]: direct office [phone]: mobile    CLINICAL CARE PLAN: Patient Care  Plan: Heart Failure (Adult)    Problem Identified: Symptom Exacerbation (Heart Failure)   Priority: Medium    Long-Range Goal: Symptom Exacerbation Prevented or Minimized   Start Date: 12/10/2020  Expected End Date: 06/12/2021  This Visit's Progress: On track  Priority: Medium  Note:   Current Barriers:  Marland Kitchen Knowledge deficits related to basic heart failure pathophysiology and self care management . Unable to independently read, write, understand health conditions and advice- relies on family to manage health needs/ affairs . Does not adhere to provider recommendations re: daily weight monitoring/ recording  . Financial strain- reports difficulty affording "some medications:" CCM Pharmacy referral placed  Nurse Case Manager Clinical Goal(s):  Over the next 6 months, patient/ caregiver will: Marland Kitchen Verbalize consistent adherence to daily weight monitoring/ recording . Verbalize understanding of Heart Failure Action Plan; when to call care providers vs seek emergent/ urgent care . Take all mediations as prescribed/ work with CCM pharmacy team to address states pharmacy needs Interventions:  . Collaboration with [name], MD regarding development and update of comprehensive plan of care as evidenced by provider attestation and co-signature . Inter-disciplinary care team collaboration (see longitudinal plan of care) . Chart reviewed including relevant office/ hospital notes, recent/ upcoming scheduled appointments, and lab results . Initiated RN CM initial assessment and confirmed inconsistent daily weight monitoring/ recording at home;  basic overview and discussion of pathophysiology of Heart Failure with patient and encouraged him to begin consistent  monitoring/ recording of daily weights at home.  Provided verbal education and importance/ rationale for daily weight monitoring/ recording at home- patient reports he "has heard this before" and confirms his caregiver/ son "stays on top" of  him to do . Provided education around signs/ symptoms yellow CHF zone/ weight gain guidelines in setting of CHF along with corresponding action plan:  patient will require ongoing reinforcement around same . Confirmed caregiver able to verbalize good general understanding of weight gain guidelines and corresponding action plan for same . Reviewed recent weights at home with patient: patient reports general weight ranges at home between 220-223 lbs since last hospital discharge; reports last weight obtained was "222 lbs" at PCP office visit 12/04/20 . Confirmed follows low salt diet- positive reinforcement provided . Discussed/ provided verbal education around use of diuretics at home, need to balance use of "extra" use of diuretic for development of signs/ symptoms CHF exacerbation with prevention of dehydration: discussed ongoing use of potassium and provided education of importance of potassium . Discussed/ provided education around safe use of NTG at home; confirmed patient has not needed recently  Discussed plans with patient for ongoing care management follow up and provided patient with direct contact information for care management team Self-Care Activities:  . Takes Heart Failure Medications as prescribed under direction of family caregivers . Inconsistently monitors daily weights at home: reports weighs "a couple of times every week" . Caregiver able to verbalize action plan to notify MD of 3 lb weight gain over night or 5 lb in a week- caregiver works and tries to review patient's weights regularly . Adheres to low sodium diet  Patient Goals:  . Mamie Levers myself daily  . Call office if I gain more than 2 pounds in one day or 5 pounds in one week . Track daily weight in diary or on a calendar so you can easily keep up with weight gain from day to day . Continue using salt in moderation . Watch for swelling in feet, ankles and legs every day Follow Up Plan:  . Telephone follow up appointment  with caregiver/ care management team member scheduled for: Jan 07, 2021 at 3:30 pm . The patient has been provided with contact information for the care management team and has been advised to call with any health related questions or concerns.     Patient Care Plan: General Plan of Care (Adult)    Problem Identified: Anemia/ GI bleeding- Health Promotion or Disease Self-Management (General Plan of Care)   Priority: Medium    Long-Range Goal: Self-Management Plan Developed   Start Date: 12/10/2020  Expected End Date: 06/12/2021  This Visit's Progress: On track  Priority: Medium  Note:   Current Barriers:   Ineffective Self Health Maintenance of recurrent GI bleeding/ anemia  Unable to independently read, write, reports difficulty understanding health problems/ advice- relies on family members to help; family handles all of patient's health activities, including medication administration at home  Reports issues with memory Clinical Goal(s):  Marland Kitchen Collaboration with [name], MD regarding development and update of comprehensive plan of care as evidenced by provider attestation and co-signature . Inter-disciplinary care team collaboration (see longitudinal plan of care) Over the next 6 months, patient/ caregiver will: . work with care management team to address care coordination and chronic disease management needs related to frequent recurrent episodes GI bleeding requiring hospitalization .  care coordination needs for: medication reconciliation and possible medication assistance    Interventions:   Evaluation of current treatment plan related to , Medication procurement, Literacy concerns, and Memory Deficits self-management and patient's adherence to plan as established by provider.  Collaboration with [name], MD regarding development and update of comprehensive plan of care as evidenced by provider attestation       and co-signature  Inter-disciplinary care  team collaboration (see longitudinal plan of care)  Chart reviewed including relevant office and hospital notes, upcoming scheduled appointments, and lab results  Discussed current  clinical condition with patient/ caregiver and confirmed no current clinical or medication concerns; confirmed patient's family/ caregiver manages all of patient's health affairs and  medications at home and report adherence to medication regimen  Full medication completed with caregiver today: discovered several medication discrepancies, including 1) caregiver reports administering carafate QD (prescription order instructs QID): careiver reports did not know to give QID: purpose/ importance of medication in setting of anemia/ recurrent GI bleeding discussed with caregiver- advised that he begin adminstering QID as prescribed- caregiver verbalizes understanding and states will do; 2) reports patient currently not taking protonix, although this is listed as a current medication: caregiver reports he "thinks" this medication was discontinued during one of patient's recent hospital visits and not removed from his medication list; 3) difficulty affording anoro ellipta inhaler and "another medicine" which caregiver is unable to recall today, stating they have not had to refill it recently, so he cant remember the name of the medicine; 4) confirmed patient has been advised to stop iron supplement- caregiver reports "makes him get constipated and then has caused bleeding from constipation."  Discussed with caregiver that CCM services include pharmacy team and he is agreeable to my placing request with CCM pharmacist to outreach caregiver to address medication adherence, possible need for financial assistance; encouraged caregiver to try and determine which of the "other" medications he reports they have difficulty affording so he can share with CCM pharmacy at time of outreach; encouraged caregiver's engagement with CCM pharmacy team-  referral message sent to [facility] Pharmacist for scheduling  Confirmed that caregiver and patient "attended" post- recent hospital discharge office visit with GI provider, "Dr. Jacqualyn Posey;" states patient is to return to GI provider only "as needed;" reviewed recent post-hospital discharge office visit with PCP on 12/04/20- confirmed no changes made to medications/ general plan of care  Discussed/ provided verbal and printed education signs/ symptoms GI bleeding/ acute anemia and action plan to promptly notify care providers for concerns, development of signs/ symptoms; discussed reasons to call providers vs seek emergent/ urgent care  Confirmed patient continues to endorse following low salt, heart healthy diet, caregiver states "we try to make sure he eats bland food"  Discussed insurance provider benefit of food/ meal provision post-hospitalization: patient reports he was told by hospital staff that he "qualifies" for this but has not yet received anything from his insurance company- encouraged him to follow up with insurance company- he declines needing assistance with this today  Reviewed upcoming provider appointments with patient/ caregiver: confirmed no upcoming scheduled appointments- encouraged patient/ caregiver to stay on top of routine appointments  Discussed plans with patient for ongoing care management follow up and provided patient with direct contact information for care management team Self Care Activities:  . Patient/ caregiver verbalizes understanding of plan to promptly notify care provider of development of signs/ symptoms GI bleeding . Attends all scheduled provider appointments- confirmed son provides transportation Patient  Goals: . Discuss my treatment options with the doctor or nurse . Make shared treatment decisions with doctor  . Know the signs/ symptoms of GI (stomach- gastrointestinal) bleeding: if you develop these signs and symptoms, promptly contact you care providers so  you can hopefully avoid having to go back to the hospital . Please have your family members read over the attached educational information with you- it is important to take action early when you develop signs/ symptoms of bleeding Follow Up Plan:  . Telephone follow up appointment with caregiver/ son [name] and care management team member scheduled for: Jan 07, 2021 at 3:30 pm . The patient has been provided with contact information for the care management team and has been advised to call with any health related questions or concerns.

## 2020-12-12 ENCOUNTER — Other Ambulatory Visit: Payer: Self-pay | Admitting: Internal Medicine

## 2020-12-17 ENCOUNTER — Telehealth: Payer: Self-pay | Admitting: Internal Medicine

## 2020-12-17 NOTE — Progress Notes (Signed)
  Chronic Care Management   Outreach Note  12/17/2020 Name: [name] MRN: [id number] DOB: July 22, 1946  Referred by: [name], MD Reason for referral : No chief complaint on file.   An unsuccessful telephone outreach was attempted today. The patient was referred to the pharmacist for assistance with care management and care coordination.   Follow Up Plan:   Carley Perdue [employer] Scheduler

## 2020-12-24 ENCOUNTER — Telehealth: Payer: Self-pay | Admitting: Internal Medicine

## 2020-12-24 NOTE — Progress Notes (Signed)
  Chronic Care Management   Outreach Note  12/24/2020 Name: SLOAN TAKAGI MRN: [id number] DOB: [birth year]  Referred by: [name], MD Reason for referral : No chief complaint on file.   A second unsuccessful telephone outreach was attempted today. The patient was referred to pharmacist for assistance with care management and care coordination.  Follow Up Plan:   Carley Perdue [employer] Scheduler

## 2021-01-04 ENCOUNTER — Other Ambulatory Visit: Payer: Self-pay | Admitting: Internal Medicine

## 2021-01-06 ENCOUNTER — Other Ambulatory Visit: Payer: Self-pay

## 2021-01-06 ENCOUNTER — Inpatient Hospital Stay (HOSPITAL_COMMUNITY)
Admission: EM | Admit: 2021-01-06 | Discharge: 2021-01-17 | DRG: 356 | Disposition: A | Discharge status: Home / Self Care | Payer: Medicare HMO | Attending: Internal Medicine | Admitting: Internal Medicine

## 2021-01-06 ENCOUNTER — Emergency Department (HOSPITAL_COMMUNITY): Payer: Medicare HMO

## 2021-01-06 ENCOUNTER — Encounter (HOSPITAL_COMMUNITY): Payer: Self-pay | Admitting: Emergency Medicine

## 2021-01-06 DIAGNOSIS — N200 Calculus of kidney: Secondary | ICD-10-CM | POA: Diagnosis not present

## 2021-01-06 DIAGNOSIS — E118 Type 2 diabetes mellitus with unspecified complications: Secondary | ICD-10-CM | POA: Diagnosis not present

## 2021-01-06 DIAGNOSIS — D649 Anemia, unspecified: Secondary | ICD-10-CM | POA: Diagnosis not present

## 2021-01-06 DIAGNOSIS — Z6837 Body mass index (BMI) 37.0-37.9, adult: Secondary | ICD-10-CM

## 2021-01-06 DIAGNOSIS — Z801 Family history of malignant neoplasm of trachea, bronchus and lung: Secondary | ICD-10-CM

## 2021-01-06 DIAGNOSIS — K922 Gastrointestinal hemorrhage, unspecified: Secondary | ICD-10-CM | POA: Diagnosis not present

## 2021-01-06 DIAGNOSIS — K3189 Other diseases of stomach and duodenum: Secondary | ICD-10-CM | POA: Diagnosis not present

## 2021-01-06 DIAGNOSIS — E785 Hyperlipidemia, unspecified: Secondary | ICD-10-CM | POA: Diagnosis present

## 2021-01-06 DIAGNOSIS — E1122 Type 2 diabetes mellitus with diabetic chronic kidney disease: Secondary | ICD-10-CM | POA: Diagnosis present

## 2021-01-06 DIAGNOSIS — K766 Portal hypertension: Secondary | ICD-10-CM | POA: Diagnosis present

## 2021-01-06 DIAGNOSIS — I272 Pulmonary hypertension, unspecified: Secondary | ICD-10-CM | POA: Diagnosis not present

## 2021-01-06 DIAGNOSIS — Z951 Presence of aortocoronary bypass graft: Secondary | ICD-10-CM | POA: Diagnosis not present

## 2021-01-06 DIAGNOSIS — M545 Low back pain, unspecified: Secondary | ICD-10-CM | POA: Diagnosis present

## 2021-01-06 DIAGNOSIS — I8510 Secondary esophageal varices without bleeding: Secondary | ICD-10-CM | POA: Diagnosis present

## 2021-01-06 DIAGNOSIS — I5030 Unspecified diastolic (congestive) heart failure: Secondary | ICD-10-CM | POA: Diagnosis present

## 2021-01-06 DIAGNOSIS — I509 Heart failure, unspecified: Secondary | ICD-10-CM | POA: Diagnosis not present

## 2021-01-06 DIAGNOSIS — Z87891 Personal history of nicotine dependence: Secondary | ICD-10-CM

## 2021-01-06 DIAGNOSIS — D61818 Other pancytopenia: Secondary | ICD-10-CM | POA: Diagnosis present

## 2021-01-06 DIAGNOSIS — I2510 Atherosclerotic heart disease of native coronary artery without angina pectoris: Secondary | ICD-10-CM | POA: Diagnosis present

## 2021-01-06 DIAGNOSIS — N1831 Chronic kidney disease, stage 3a: Secondary | ICD-10-CM | POA: Diagnosis present

## 2021-01-06 DIAGNOSIS — G4733 Obstructive sleep apnea (adult) (pediatric): Secondary | ICD-10-CM | POA: Diagnosis present

## 2021-01-06 DIAGNOSIS — J45909 Unspecified asthma, uncomplicated: Secondary | ICD-10-CM | POA: Diagnosis present

## 2021-01-06 DIAGNOSIS — Z20822 Contact with and (suspected) exposure to covid-19: Secondary | ICD-10-CM | POA: Diagnosis present

## 2021-01-06 DIAGNOSIS — K2270 Barrett's esophagus without dysplasia: Secondary | ICD-10-CM | POA: Diagnosis present

## 2021-01-06 DIAGNOSIS — K219 Gastro-esophageal reflux disease without esophagitis: Secondary | ICD-10-CM | POA: Diagnosis present

## 2021-01-06 DIAGNOSIS — K5900 Constipation, unspecified: Secondary | ICD-10-CM | POA: Diagnosis not present

## 2021-01-06 DIAGNOSIS — R0602 Shortness of breath: Secondary | ICD-10-CM | POA: Diagnosis not present

## 2021-01-06 DIAGNOSIS — K2900 Acute gastritis without bleeding: Secondary | ICD-10-CM | POA: Diagnosis present

## 2021-01-06 DIAGNOSIS — K7031 Alcoholic cirrhosis of liver with ascites: Secondary | ICD-10-CM

## 2021-01-06 DIAGNOSIS — M479 Spondylosis, unspecified: Secondary | ICD-10-CM | POA: Diagnosis present

## 2021-01-06 DIAGNOSIS — G8929 Other chronic pain: Secondary | ICD-10-CM | POA: Diagnosis present

## 2021-01-06 DIAGNOSIS — Z9889 Other specified postprocedural states: Secondary | ICD-10-CM | POA: Diagnosis not present

## 2021-01-06 DIAGNOSIS — I8500 Esophageal varices without bleeding: Secondary | ICD-10-CM | POA: Diagnosis not present

## 2021-01-06 DIAGNOSIS — E669 Obesity, unspecified: Secondary | ICD-10-CM | POA: Diagnosis present

## 2021-01-06 DIAGNOSIS — Z7984 Long term (current) use of oral hypoglycemic drugs: Secondary | ICD-10-CM

## 2021-01-06 DIAGNOSIS — Z8505 Personal history of malignant neoplasm of liver: Secondary | ICD-10-CM

## 2021-01-06 DIAGNOSIS — R0600 Dyspnea, unspecified: Secondary | ICD-10-CM | POA: Diagnosis not present

## 2021-01-06 DIAGNOSIS — J90 Pleural effusion, not elsewhere classified: Secondary | ICD-10-CM | POA: Diagnosis not present

## 2021-01-06 DIAGNOSIS — K921 Melena: Secondary | ICD-10-CM | POA: Diagnosis not present

## 2021-01-06 DIAGNOSIS — K31811 Angiodysplasia of stomach and duodenum with bleeding: Secondary | ICD-10-CM | POA: Diagnosis not present

## 2021-01-06 DIAGNOSIS — R69 Illness, unspecified: Secondary | ICD-10-CM | POA: Diagnosis not present

## 2021-01-06 DIAGNOSIS — I864 Gastric varices: Secondary | ICD-10-CM | POA: Diagnosis present

## 2021-01-06 DIAGNOSIS — I5033 Acute on chronic diastolic (congestive) heart failure: Secondary | ICD-10-CM | POA: Diagnosis not present

## 2021-01-06 DIAGNOSIS — Z79899 Other long term (current) drug therapy: Secondary | ICD-10-CM

## 2021-01-06 DIAGNOSIS — E11649 Type 2 diabetes mellitus with hypoglycemia without coma: Secondary | ICD-10-CM | POA: Diagnosis not present

## 2021-01-06 DIAGNOSIS — I110 Hypertensive heart disease with heart failure: Secondary | ICD-10-CM | POA: Diagnosis not present

## 2021-01-06 DIAGNOSIS — E1142 Type 2 diabetes mellitus with diabetic polyneuropathy: Secondary | ICD-10-CM | POA: Diagnosis present

## 2021-01-06 DIAGNOSIS — N179 Acute kidney failure, unspecified: Secondary | ICD-10-CM | POA: Diagnosis not present

## 2021-01-06 DIAGNOSIS — N183 Chronic kidney disease, stage 3 unspecified: Secondary | ICD-10-CM | POA: Diagnosis present

## 2021-01-06 DIAGNOSIS — K7460 Unspecified cirrhosis of liver: Secondary | ICD-10-CM | POA: Diagnosis present

## 2021-01-06 DIAGNOSIS — I130 Hypertensive heart and chronic kidney disease with heart failure and stage 1 through stage 4 chronic kidney disease, or unspecified chronic kidney disease: Secondary | ICD-10-CM | POA: Diagnosis present

## 2021-01-06 DIAGNOSIS — I5032 Chronic diastolic (congestive) heart failure: Secondary | ICD-10-CM | POA: Diagnosis not present

## 2021-01-06 DIAGNOSIS — D62 Acute posthemorrhagic anemia: Secondary | ICD-10-CM | POA: Diagnosis present

## 2021-01-06 DIAGNOSIS — I517 Cardiomegaly: Secondary | ICD-10-CM | POA: Diagnosis not present

## 2021-01-06 DIAGNOSIS — D696 Thrombocytopenia, unspecified: Secondary | ICD-10-CM | POA: Diagnosis not present

## 2021-01-06 DIAGNOSIS — Z955 Presence of coronary angioplasty implant and graft: Secondary | ICD-10-CM

## 2021-01-06 LAB — RESP PANEL BY RT-PCR (FLU A&B, COVID) ARPGX2
Influenza A by PCR: NEGATIVE
Influenza B by PCR: NEGATIVE
SARS Coronavirus 2 by RT PCR: NEGATIVE

## 2021-01-06 LAB — CBC
HCT: 18.5 % — ABNORMAL LOW (ref 39.0–52.0)
Hemoglobin: 4.8 g/dL — CL (ref 13.0–17.0)
MCH: 20.1 pg — ABNORMAL LOW (ref 26.0–34.0)
MCHC: 25.9 g/dL — ABNORMAL LOW (ref 30.0–36.0)
MCV: 77.4 fL — ABNORMAL LOW (ref 80.0–100.0)
Platelets: 133 10*3/uL — ABNORMAL LOW (ref 150–400)
RBC: 2.39 MIL/uL — ABNORMAL LOW (ref 4.22–5.81)
RDW: 19.9 % — ABNORMAL HIGH (ref 11.5–15.5)
WBC: 3.4 10*3/uL — ABNORMAL LOW (ref 4.0–10.5)
nRBC: 0 % (ref 0.0–0.2)

## 2021-01-06 LAB — COMPREHENSIVE METABOLIC PANEL
ALT: 15 U/L (ref 0–44)
AST: 19 U/L (ref 15–41)
Albumin: 3.7 g/dL (ref 3.5–5.0)
Alkaline Phosphatase: 96 U/L (ref 38–126)
Anion gap: 8 (ref 5–15)
BUN: 34 mg/dL — ABNORMAL HIGH (ref 8–23)
CO2: 24 mmol/L (ref 22–32)
Calcium: 8.5 mg/dL — ABNORMAL LOW (ref 8.9–10.3)
Chloride: 104 mmol/L (ref 98–111)
Creatinine, Ser: 1.33 mg/dL — ABNORMAL HIGH (ref 0.61–1.24)
GFR, Estimated: 56 mL/min — ABNORMAL LOW (ref >60)
Glucose, Bld: 73 mg/dL (ref 70–99)
Potassium: 3.6 mmol/L (ref 3.5–5.1)
Sodium: 136 mmol/L (ref 135–145)
Total Bilirubin: 0.7 mg/dL (ref 0.3–1.2)
Total Protein: 7.1 g/dL (ref 6.5–8.1)

## 2021-01-06 LAB — PREPARE RBC (CROSSMATCH)

## 2021-01-06 LAB — POC OCCULT BLOOD, ED: Fecal Occult Bld: POSITIVE — AB

## 2021-01-06 LAB — BRAIN NATRIURETIC PEPTIDE: B Natriuretic Peptide: 337.7 pg/mL — ABNORMAL HIGH (ref 0.0–100.0)

## 2021-01-06 MED ORDER — ALBUTEROL SULFATE HFA 108 (90 BASE) MCG/ACT IN AERS
2.0000 | INHALATION_SPRAY | Freq: Four times a day (QID) | RESPIRATORY_TRACT | Status: DC | PRN
  Filled 2021-01-06: qty 6.7

## 2021-01-06 MED ORDER — ATORVASTATIN CALCIUM 40 MG PO TABS
40.0000 mg | ORAL_TABLET | Freq: Every day | ORAL | Status: DC
  Administered 2021-01-07 – 2021-01-16 (×10): 40 mg via ORAL
  Filled 2021-01-06 (×10): qty 1

## 2021-01-06 MED ORDER — ACETAMINOPHEN 325 MG PO TABS: 650.0000 mg | ORAL_TABLET | Freq: Four times a day (QID) | ORAL | Status: DC | PRN

## 2021-01-06 MED ORDER — ONDANSETRON HCL 4 MG/2ML IJ SOLN: 4.0000 mg | Freq: Four times a day (QID) | INTRAMUSCULAR | Status: DC | PRN

## 2021-01-06 MED ORDER — PANTOPRAZOLE SODIUM 40 MG IV SOLR
40.0000 mg | Freq: Two times a day (BID) | INTRAVENOUS | Status: DC
  Administered 2021-01-06 – 2021-01-15 (×19): 40 mg via INTRAVENOUS
  Filled 2021-01-06 (×20): qty 40

## 2021-01-06 MED ORDER — SODIUM CHLORIDE 0.9% FLUSH
3.0000 mL | Freq: Two times a day (BID) | INTRAVENOUS | Status: DC
  Administered 2021-01-06 – 2021-01-17 (×17): 3 mL via INTRAVENOUS

## 2021-01-06 MED ORDER — FUROSEMIDE 10 MG/ML IJ SOLN
60.0000 mg | Freq: Once | INTRAMUSCULAR | Status: AC
  Administered 2021-01-06: 60 mg via INTRAVENOUS
  Filled 2021-01-06: qty 8

## 2021-01-06 MED ORDER — SODIUM CHLORIDE 0.9 % IV SOLN: 10.0000 mL/h | Freq: Once | INTRAVENOUS | Status: DC

## 2021-01-06 MED ORDER — ONDANSETRON HCL 4 MG PO TABS
4.0000 mg | ORAL_TABLET | Freq: Four times a day (QID) | ORAL | Status: DC | PRN
Start: 2021-01-06 — End: 2021-01-17

## 2021-01-06 MED ORDER — UMECLIDINIUM-VILANTEROL 62.5-25 MCG/INH IN AEPB
1.0000 | INHALATION_SPRAY | Freq: Every day | RESPIRATORY_TRACT | Status: DC
  Filled 2021-01-06: qty 14

## 2021-01-06 MED ORDER — UMECLIDINIUM-VILANTEROL 62.5-25 MCG/INH IN AEPB
1.0000 | INHALATION_SPRAY | Freq: Every day | RESPIRATORY_TRACT | Status: DC
  Administered 2021-01-07 – 2021-01-17 (×10): 1 via RESPIRATORY_TRACT
  Filled 2021-01-06 (×3): qty 14

## 2021-01-06 MED ORDER — ACETAMINOPHEN 650 MG RE SUPP: 650.0000 mg | Freq: Four times a day (QID) | RECTAL | Status: DC | PRN

## 2021-01-06 MED ORDER — FUROSEMIDE 10 MG/ML IJ SOLN
40.0000 mg | Freq: Once | INTRAMUSCULAR | Status: AC
  Administered 2021-01-07: 40 mg via INTRAVENOUS
  Filled 2021-01-06: qty 4

## 2021-01-06 NOTE — ED Triage Notes (Signed)
Patient complains of SOB since yesterday and bilateral leg swelling x2 weeks. Endorses dizziness w/ position changes. Denies N/V or fevers.

## 2021-01-06 NOTE — ED Notes (Signed)
Admitting MD at bedside.

## 2021-01-06 NOTE — H&P (Signed)
History and Physical    [name] VQM:[id number] DOB: [birth year] DOA: 01/06/2021  PCP: [name], MD  Patient coming from: Home  I have personally briefly reviewed patient's old medical records in [place]  Chief Complaint: Shortness of breath, dizziness  HPI: [name] is a 75 y.o. male with medical history significant for hepatic cirrhosis with portal hypertension and esophageal varices, recurrent anemia, hepatocellular carcinoma s/p treatment 07/18/2020, CAD s/p CABG, chronic diastolic CHF (EF 93-26%, Z1IW by TTE 05/28/2020), CKD stage IIIa, asthma, T2DM, HTN, HLD who presents to the ED for evaluation of shortness of breath and dizziness.  Patient states yesterday he noted having black-colored stool without red blood.  He says he has become short of breath and lightheadedness with minimal activity.  He has not passed out or fallen.  He has not seen any other obvious bleeding and says his stools are now appearing brown.  He has felt fatigued.  He reports similar symptoms to prior anemia episodes.  He was last admitted 11/18/2020-11/23/2020 for symptomatic anemia.  GI work-up with EGD, colonoscopy, and capsule endoscopy did not find source of acute bleeding.  He required 6 units PRBC transfusions total during the hospitalization.  Hospitalization was complicated by volume overload requiring diuresis.  He otherwise denies any chest pain, abdominal pain, nausea, vomiting, dysuria.  He does report increased lower extremity swelling from his baseline.  ED Course:  Initial vitals showed BP 127/51, pulse 70, RR 18, temp 97.8 F, SPO2 100% on room air.  Labs show hemoglobin 4.8 (7.7 on 12/04/2020), hematocrit 18.5, MCV 77.4, platelets 133,000, WBC 3.4, sodium 136, potassium 3.6, bicarb 24, BUN 34, creatinine 1.33, serum glucose 73, LFTs within normal limits, BNP 337.7.  SARS-CoV-2 PCR negative.  Influenza A/B PCR's negative.  FOBT positive.  Portable chest x-ray shows  enlarged cardiac silhouette with interstitial edema similar to prior exam.  Prior CABG changes noted.  Patient was given IV Lasix 60 mg and order to receive 2 unit PRBC transfusion.  The hospitalist service was consulted to admit for further evaluation and management.  Review of Systems: All systems reviewed and are negative except as documented in history of present illness above.   Past Medical History:  Diagnosis Date  . Arthritis    "joints tighten up sometimes" (03/27/2104)  . Carcinoma of liver, hepatocellular ([place]) 06/30/2020  . CHF (congestive heart failure) ([place])   . Chronic lower back pain   . Coronary artery disease    a. 05/2012 Cath/PCI: LM 40ost, 50-60d, LAD 99p ruptured plaque (3.0x28 DES), LCX 50p/m, RCA 30-40p, 63m EF 65-70%.  . Coronary artery disease involving native coronary artery of native heart without angina pectoris    Severe left main disease at catheterization July 2015  CABG x3 with a LIMA to the LAD, SVG to the OM, SVG to the PDA on 03/31/14. EF 60% by cath.   . Diastolic heart failure ([place]   . GERD without esophagitis 08/04/2010  . Hepatic cirrhosis ([place]    a. Dx 01/2014 - CT a/p   . History of blood transfusion    "related to bleeding ulcers"  . History of concussion    1976--  NO RESIDUAL  . History of GI bleed    a. UGIB 07/2012;  b. 01/2014 admission with GIB/FOB stool req 1U prbc's->EGD showed portal gastropathy, barrett's esoph, and chronic active h. pylori gastritis.  .Marland KitchenHistory of gout    2007 &  2008  LEFT LEG-- NO ISSUE  SINCE  . Hyperlipidemia   . Iron deficiency anemia   . Kidney stones   . OA (osteoarthritis of spine)    LOWER BACK--  INTERMITTANT LEFT LEG NUMBNESS  . OSA (obstructive sleep apnea)    PULMOLOGIST-  DR CLANCE--  MODERATE OSA  STARTED CPAP 2012--  BUT CURRENTLY HAS NOT USED PAST 6 MONTHS  . Phimosis    a. s/p circumcision 2015.  . Type 2 diabetes mellitus ([place])   . Unspecified essential hypertension     Past Surgical  History:  Procedure Laterality Date  . ANKLE FRACTURE SURGERY Right 1989   "plate put in"  . APPENDECTOMY  05-16-2004   open  . CIRCUMCISION N/A 09/09/2013   Procedure: CIRCUMCISION ADULT;  Surgeon: Bernestine Amass, MD;  Location: [facility];  Service: Urology;  Laterality: N/A;  . COLECTOMY  05-16-2004  . COLONOSCOPY WITH PROPOFOL N/A 11/20/2020   Procedure: COLONOSCOPY WITH PROPOFOL;  Surgeon: Ronald Lobo, MD;  Location: [facility]COPY;  Service: Endoscopy;  Laterality: N/A;  . CORONARY ANGIOPLASTY WITH STENT PLACEMENT  06/28/2012  DR COOPER   PCI W/  X1 DES to [place]. LAD/  LM  40% OSTIAL & 50-60% DISTAL /  50% PROX LCX/  30-40% PROX RCA & 50% MID RCA/   LVEF 65-70%  . CORONARY ARTERY BYPASS GRAFT N/A 03/31/2014   Procedure: CORONARY ARTERY BYPASS GRAFTING (CABG) times 3 using left internal mammary artery and right saphenous vein.;  Surgeon: Melrose Nakayama, MD;  Location: [place];  Service: Open Heart Surgery;  Laterality: N/A;  . DOBUTAMINE STRESS ECHO  06-08-2012   MODERATE HYPOKINESIS/ ISCHEMIA MID INFERIOR WALL  . ESOPHAGOGASTRODUODENOSCOPY  08/15/2012   Procedure: ESOPHAGOGASTRODUODENOSCOPY (EGD);  Surgeon: Wonda Horner, MD;  Location[facility] END[facility]COPY;  Service: Endoscopy;  Laterality: N/A;  . ESOPHAGOGASTRODUODENOSCOPY N/A 02/17/2014   Procedure: ESOPHAGOGASTRODUODENOSCOPY (EGD);  Surgeon: Jeryl Columbia, MD;  Location: [facility] ENDOSCOPY;  Service: Endoscopy;  Laterality: N/A;  . ESOPHAGOGASTRODUODENOSCOPY N/A 04/17/2020   Procedure: ESOPHAGOGASTRODUODENOSCOPY (EGD);  Surgeon: Ronnette Juniper, MD;  Location: [place];  Service: Gastroenterology;  Laterality: N/A;  . ESOPHAGOGASTRODUODENOSCOPY N/A 09/11/2020   Procedure: ESOPHAGOGASTRODUODENOSCOPY (EGD);  Surgeon: Clarene Essex, MD;  Location: [facility];  Service: Endoscopy;  Laterality: N/A;  . ESOPHAGOGASTRODUODENOSCOPY  10/09/2020  . ESOPHAGOGASTRODUODENOSCOPY N/A 10/09/2020   Procedure: ESOPHAGOGASTRODUODENOSCOPY (EGD);   Surgeon: Otis Brace, MD;  Location: [facility] ENDOSCOPY;  Service: Gastroenterology;  Laterality: N/A;  . ESOPHAGOGASTRODUODENOSCOPY (EGD) WITH PROPOFOL N/A 09/30/2017   Procedure: ESOPHAGOGASTRODUODENOSCOPY (EGD) WITH PROPOFOL;  Surgeon: Wonda Horner, MD;  Location: [facility] ENDOSCOPY;  Service: Endoscopy;  Laterality: N/A;  . ESOPHAGOGASTRODUODENOSCOPY (EGD) WITH PROPOFOL N/A 12/20/2017   Procedure: ESOPHAGOGASTRODUODENOSCOPY (EGD) WITH PROPOFOL;  Surgeon: Clarene Essex, MD;  Location: [place];  Service: Endoscopy;  Laterality: N/A;  . ESOPHAGOGASTRODUODENOSCOPY (EGD) WITH PROPOFOL N/A 08/10/2020   Procedure: ESOPHAGOGASTRODUODENOSCOPY (EGD) WITH PROPOFOL;  Surgeon: Wilford Corner, MD;  Location: [place];  Service: Endoscopy;  Laterality: N/A;  . ESOPHAGOGASTRODUODENOSCOPY (EGD) WITH PROPOFOL N/A 11/20/2020   Procedure: ESOPHAGOGASTRODUODENOSCOPY (EGD) WITH PROPOFOL;  Surgeon: Ronald Lobo, MD;  Location: [facility] ENDOS[facility];  Service: Endoscopy;  Laterality: N/A;  . GIVENS CAPSULE STUDY N/A 11/20/2020   Procedure: GIVENS CAPSULE STUDY;  Surgeon: Ronald Lobo, MD;  Location: [facility] ENDOS[facility];  Service: Endoscopy;  Laterality: N/A;  . HERNIA REPAIR    . INTRAOPERATIVE TRANSESOPHAGEAL ECHOCARDIOGRAM N/A 03/31/2014   Procedure: INTRAOPERATIVE TRANSESOPHAGEAL ECHOCARDIOGRAM;  Surgeon: Melrose Nakayama, MD;  Location: [place];  Service: Open Heart Surgery;  Laterality: N/A;  . IR ANGIOGRAM SELECTIVE EACH ADDITIONAL VESSEL  06/16/2020  . IR ANGIOGRAM SELECTIVE EACH ADDITIONAL VESSEL  06/16/2020  . IR ANGIOGRAM SELECTIVE EACH ADDITIONAL VESSEL  06/16/2020  . IR ANGIOGRAM SELECTIVE EACH ADDITIONAL VESSEL  06/16/2020  . IR ANGIOGRAM SELECTIVE EACH ADDITIONAL VESSEL  06/16/2020  . IR ANGIOGRAM SELECTIVE EACH ADDITIONAL VESSEL  06/16/2020  . IR ANGIOGRAM SELECTIVE EACH ADDITIONAL VESSEL  06/16/2020  . IR ANGIOGRAM SELECTIVE EACH ADDITIONAL VESSEL  06/16/2020  . IR ANGIOGRAM SELECTIVE EACH ADDITIONAL VESSEL   06/16/2020  . IR ANGIOGRAM SELECTIVE EACH ADDITIONAL VESSEL  06/30/2020  . IR ANGIOGRAM SELECTIVE EACH ADDITIONAL VESSEL  06/30/2020  . IR ANGIOGRAM VISCERAL SELECTIVE  06/16/2020  . IR ANGIOGRAM VISCERAL SELECTIVE  06/30/2020  . IR EMBO ARTERIAL NOT HEMORR HEMANG INC GUIDE ROADMAPPING  06/16/2020  . IR EMBO TUMOR ORGAN ISCHEMIA INFARCT INC GUIDE ROADMAPPING  06/30/2020  . IR RADIOLOGIST EVAL & MGMT  05/06/2020  . IR RADIOLOGIST EVAL & MGMT  05/20/2020  . IR US GUIDE VASC ACCESS RIGHT  06/16/2020  . IR US GUIDE VASC ACCESS RIGHT  06/30/2020  . LAPAROSCOPIC UMBILICAL HERNIA REPAIR W/ MESH  06-06-2011  . LEFT HEART CATHETERIZATION WITH CORONARY ANGIOGRAM N/A 03/28/2014   Procedure: LEFT HEART CATHETERIZATION WITH CORONARY ANGIOGRAM;  Surgeon: Sinclair Grooms, MD;  Location[facility] CATH LAB;  Service: Cardiovascular;  Laterality: N/A;  . LIPOMA EXCISION Left 08/25/2017   Procedure: EXCISION LIPOMA LEFT POSTERIOR THIGH;  Surgeon: Clovis Riley, MD;  Location: [place];  Service: General;  Laterality: Left;  . NEPHROLITHOTOMY  1990'S  . OPEN APPENDECTOMY W/ PARTIAL CECECTOMY  05-16-2004  . PERCUTANEOUS CORONARY STENT INTERVENTION (PCI-S) N/A 06/28/2012   Procedure: PERCUTANEOUS CORONARY STENT INTERVENTION (PCI-S);  Surgeon: Sherren Mocha, MD;  [facility] C[facility] LAB;  Service: Cardiovascular;  Laterality: N/A;  . POLYPECTOMY  11/20/2020   Procedure: POLYPECTOMY;  Surgeon: Ronald Lobo, MD;  Location: [facility] ENDOS[facility];  Service: Endoscopy;;    Social History:  reports that he quit smoking about 24 years ago. His smoking use included cigarettes. He has a 80.00 pack-year smoking history. He has never used smokeless tobacco. He reports current alcohol use of about 8.0 standard drinks of alcohol per week. He reports that he does not use drugs.  Allergies  Allergen Reactions  . Other     Pt states "I am not taking anymore flu shots. It liked to have killed me."    Family History  Problem Relation Age of  Onset  . Lung cancer Sister   . Cancer Sister        lung  . Cancer Mother   . Cancer Father        died in his 45s.  . Cancer Brother        lung  . Coronary artery disease Other   . Diabetes Other   . Colon cancer Other   . Cancer Sister        lung     Prior to Admission medications   Medication Sig Start Date End Date Taking? Authorizing Provider  Accu-Chek Softclix Lancets lancets USE AS DIRECTED TO TEST BLOOD SUGAR FOUR TIMES DAILY 03/13/20   [name], MD  albuterol (VENTOLIN HFA) 108 (90 Base) MCG/ACT inhaler Inhale 2 puffs into the lungs every 6 (six) hours as needed for wheezing or shortness of breath. 03/12/20   [name], MD  atorvastatin (LIPITOR) 40 MG tablet TAKE 1 TABLET BY MOUTH DAILY AT  6 PM 01/04/21   [name], MD  blood glucose meter kit and supplies Dispense based on patient and insurance preference. Use up to four times daily as directed. (FOR ICD-10 E10.9, E11.9). 03/12/20   [name], MD  gabapentin (NEURONTIN) 100 MG capsule TAKE 1 CAPSULE(100 MG) BY MOUTH TWICE DAILY Patient taking differently: Take 100 mg by mouth 2 (two) times daily. 08/03/20   [name], MD  glimepiride (AMARYL) 2 MG tablet TAKE 1 TABLET(2 MG) BY MOUTH DAILY BEFORE BREAKFAST 12/16/20   [name], MD  hydrocortisone (ANUSOL-HC) 2.5 % rectal cream Place 1 application rectally 2 (two) times daily. 12/04/20   [name], MD  metFORMIN (GLUCOPHAGE) 850 MG tablet TAKE 1 TABLET(850 MG) BY MOUTH TWICE DAILY WITH A MEAL Patient taking differently: Take 850 mg by mouth 2 (two) times daily with a meal. 06/16/20   [name], MD  nitroGLYCERIN (NITROSTAT) 0.4 MG SL tablet Place 1 tablet (0.4 mg total) under the tongue every 5 (five) minutes as needed for chest pain. 06/08/16   Nahser, Wonda Cheng, MD  pantoprazole (PROTONIX) 40 MG tablet Take 1 tablet (40 mg total) by mouth 2 (two) times daily. Patient not taking:  Reported on 12/10/2020 09/12/20   Pokhrel, Corrie Mckusick, MD  potassium chloride (KLOR-CON) 20 MEQ tablet Take 1 tablet (20 mEq total) by mouth daily. 08/18/20   Danford, Suann Larry, MD  Psyllium (METAMUCIL PO) Take 15 mLs by mouth 3 (three) times daily with meals. Mix with 4 oz of juice or water    [provider]  spironolactone (ALDACTONE) 25 MG tablet Take 1 tablet (25 mg total) by mouth daily. 08/18/20   Danford, Suann Larry, MD  sucralfate (CARAFATE) 1 g tablet Take 1 tablet (1 g total) by mouth 4 (four) times daily. 11/23/20 12/23/20  Elodia Florence., MD  torsemide (DEMADEX) 20 MG tablet Take 2 tablets (40 mg total) by mouth 2 (two) times daily. 08/18/20   Danford, Suann Larry, MD  umeclidinium-vilanterol (ANORO ELLIPTA) 62.5-25 MCG/INH AEPB Inhale 1 puff into the lungs daily. 03/12/20   [name], MD    Physical Exam: Vitals:   01/06/21 1715 01/06/21 1745 01/06/21 1815 01/06/21 1846  BP: (!) 114/54 (!) 115/52 (!) 105/56 96/81  Pulse: 62 63 70 69  Resp: '20 11 20 15  ' Temp:      TempSrc:      SpO2: 97% 100% 100% 100%  Weight:      Height:       Constitutional: Resting supine in bed, appears tired but in NAD, calm, comfortable Eyes: PERRL, slight conjunctival pallor ENMT: Mucous membranes are moist. Posterior pharynx clear of any exudate or lesions.Normal dentition.  Neck: normal, supple, no masses. Respiratory: Faint end expiratory wheezing bilaterally. Normal respiratory effort. No accessory muscle use.  Cardiovascular: Regular rate and rhythm, systolic murmur present.  +2 bilateral lower extremity edema.  Abdomen: no tenderness, no masses palpated. Bowel sounds positive.  Musculoskeletal: no clubbing / cyanosis. No joint deformity upper and lower extremities. Good ROM, no contractures. Normal muscle tone.  Skin: no rashes, lesions, ulcers. No induration.  Cap refill <2 seconds. Neurologic: CN 2-12 grossly intact. Sensation intact. Strength 5/5 in all 4.   Psychiatric: Normal judgment and insight. Alert and oriented x 3. Normal mood.   Labs on Admission: I have personally reviewed following labs and imaging studies  CBC: Recent Labs  Lab 01/06/21 1630  WBC 3.4*  HGB 4.8*  HCT  18.5*  MCV 77.4*  PLT 053*   Basic Metabolic Panel: Recent Labs  Lab 01/06/21 1630  NA 136  K 3.6  CL 104  CO2 24  GLUCOSE 73  BUN 34*  CREATININE 1.33*  CALCIUM 8.5*   GFR: Estimated Creatinine Clearance: 56.4 mL/min (A) (by C-G formula based on SCr of 1.33 mg/dL (H)). Liver Function Tests: Recent Labs  Lab 01/06/21 1630  AST 19  ALT 15  ALKPHOS 96  BILITOT 0.7  PROT 7.1  ALBUMIN 3.7   No results for input(s): LIPASE, AMYLASE in the last 168 hours. No results for input(s): AMMONIA in the last 168 hours. Coagulation Profile: No results for input(s): INR, PROTIME in the last 168 hours. Cardiac Enzymes: No results for input(s): CKTOTAL, CKMB, CKMBINDEX, TROPONINI in the last 168 hours. BNP (last 3 results) No results for input(s): PROBNP in the last 8760 hours. HbA1C: No results for input(s): HGBA1C in the last 72 hours. CBG: No results for input(s): GLUCAP in the last 168 hours. Lipid Profile: No results for input(s): CHOL, HDL, LDLCALC, TRIG, CHOLHDL, LDLDIRECT in the last 72 hours. Thyroid Function Tests: No results for input(s): TSH, T4TOTAL, FREET4, T3FREE, THYROIDAB in the last 72 hours. Anemia Panel: No results for input(s): VITAMINB12, FOLATE, FERRITIN, TIBC, IRON, RETICCTPCT in the last 72 hours. Urine analysis:    Component Value Date/Time   COLORURINE STRAW (A) 11/18/2020 1136   APPEARANCEUR CLEAR 11/18/2020 1136   LABSPEC 1.008 11/18/2020 1136   PHURINE 6.0 11/18/2020 1136   GLUCOSEU NEGATIVE 11/18/2020 1136   HGBUR NEGATIVE 11/18/2020 1136   BILIRUBINUR NEGATIVE 11/18/2020 1136   KETONESUR NEGATIVE 11/18/2020 1136   PROTEINUR NEGATIVE 11/18/2020 1136   UROBILINOGEN 0.2 03/29/2014 1546   NITRITE NEGATIVE 11/18/2020  1136   LEUKOCYTESUR NEGATIVE 11/18/2020 1136    Radiological Exams on Admission: DG Chest Port 1 View  Result Date: 01/06/2021 CLINICAL DATA:  Dyspnea.  Shortness of breath. EXAM: PORTABLE CHEST 1 VIEW COMPARISON:  11/20/2020 FINDINGS: Post median sternotomy and CABG. The heart is enlarged. Stable mediastinal contours. Interstitial edema which appears similar to prior exam. Probable right pleural effusion. No confluent consolidation or pneumothorax. Stable osseous structures. IMPRESSION: Cardiomegaly with interstitial edema, similar to prior exam. Probable right pleural effusion. Electronically Signed   By: Keith Rake M.D.   On: 01/06/2021 16:50    EKG: Personally reviewed. Sinus rhythm, first-degree AV block, RBBB and LPFB, low voltage.  Similar to prior.  Assessment/Plan Principal Problem:   Symptomatic anemia Active Problems:   Type 2 diabetes with complication (HCC)   Coronary artery disease involving native coronary artery of native heart without angina pectoris   Hyperlipidemia with target LDL less than 70   Hepatic cirrhosis (HCC)   CKD (chronic kidney disease), stage III (HCC)   (HFpEF) heart failure with preserved ejection fraction (HCC)   [name] is a 75 y.o. male with medical history significant for hepatic cirrhosis with portal hypertension and esophageal varices, recurrent anemia, hepatocellular carcinoma s/p treatment 07/18/2020, CAD s/p CABG, chronic diastolic CHF (EF 97-67%, H4LP by TTE 05/28/2020), CKD stage IIIa, asthma, T2DM, HTN, HLD who is admitted with recurrent symptomatic anemia.  Recurrent symptomatic anemia Hepatic cirrhosis with portal hypertension and esophageal varices: Hemoglobin 4.8 on admission.  Reports melena day prior to admission.  FOBT positive.  Recent admit in March 2022 for same without source of bleeding found on EGD, colonoscopy, or capsule endoscopy. -Transfusing 2 units PRBCs -Start IV Protonix 40 mg twice daily -Keep n.p.o.  after  midnight -Secure chat to [facility] GI for a.m. consult placed  Acute on chronic HFpEF: Given IV Lasix 60 mg once in the ED.  Will give additional IV Lasix 40 mg in the morning as he is receiving 2 units of blood.  Monitor response and determine need for additional IV Lasix versus resuming home torsemide.  Monitor strict I/O's.  CKD stage IIIa: Monitor with diuresis.  CAD s/p CABG: Stable, denies any chest pain.  Not on antiplatelets given recurrent anemia/GI bleed.  Continue statin.  Type 2 diabetes: Last A1c 5.3% 08/10/2020.  Holding home Amaryl and metformin.  Add sliding scale insulin if needed.  Hypertension: Holding home spironolactone for now given occasional low blood pressures on admission.  Asthma: Continue Breo and albuterol as needed.  Hyperlipidemia: Continue atorvastatin.  DVT prophylaxis: SCDs Code Status: Full code, confirmed with patient Family Communication: Discussed with patient, he has discussed with family Disposition Plan: From home and likely discharge to home pending clinical progress Consults called: Secure chat to [facility] GI for a.m. consult placed Level of care: Progressive Admission status:  Status is: Inpatient  Remains inpatient appropriate because:IV treatments appropriate due to intensity of illness or inability to take PO and Inpatient level of care appropriate due to severity of illness   Dispo: The patient is from: Home              Anticipated d/c is to: Home              Patient currently is not medically stable to d/c.   Difficult to place patient No  Zada Finders MD [facility]  If 7PM-7AM, please contact night-coverage [email or web address]  01/06/2021, 7:21 PM

## 2021-01-06 NOTE — ED Provider Notes (Signed)
[facility] DEPT Provider Note   CSN: [id number] Arrival date & time: 01/06/21  1532     History Chief Complaint  Patient presents with  . Shortness of Breath  . Leg Swelling    [name] is a 75 y.o. male.  HPI 75 year old male morbid obesity, history of CHF, history of coronary artery disease status post CABG, history of liver cirrhosis presents today complaining of increased swelling and dyspnea.  He states this has been going on over the past month.  He is noted increased swelling in his legs.  He has had increasing dyspnea.  He has had some coughing.  Denies fever or chills.  Denies any recent changes in medication.  colonoscopy 3/25 The terminal ileum appeared normal. - No definite source of patient's recurrent anemia seen on this study. - Two diminutive polyps in the transverse colon, removed with a cold snare. Resected and retrieved. - One 4 mm polyp in the distal rectum, removed with a cold snare. Resected and retrieved. - Erythematous mucosa in the proximal ascending colon. - The distal rectum and anal verge are normal on retroflexion view. - The examined portion of the ileum was normal. Reviewed upper endoscopy from 11/28/2018 [place] no active bleeding or source of bleeding noted Past Medical History:  Diagnosis Date  . Arthritis    "joints tighten up sometimes" (03/27/2104)  . Carcinoma of liver, hepatocellular ([place]) 06/30/2020  . CHF (congestive heart failure) ([place])   . Chronic lower back pain   . Coronary artery disease    a. 05/2012 Cath/PCI: LM 40ost, 50-60d, LAD 99p ruptured plaque (3.0x28 DES), LCX 50p/m, RCA 30-40p, 74m EF 65-70%.  . Coronary artery disease involving native coronary artery of native heart without angina pectoris    Severe left main disease at catheterization July 2015  CABG x3 with a LIMA to the LAD, SVG to the OM, SVG to the PDA on 03/31/14. EF 60% by cath.   . Diastolic heart failure ([place]   . GERD without  esophagitis 08/04/2010  . Hepatic cirrhosis ([place]    a. Dx 01/2014 - CT a/p   . History of blood transfusion    "related to bleeding ulcers"  . History of concussion    1976--  NO RESIDUAL  . History of GI bleed    a. UGIB 07/2012;  b. 01/2014 admission with GIB/FOB stool req 1U prbc's->EGD showed portal gastropathy, barrett's esoph, and chronic active h. pylori gastritis.  .Marland KitchenHistory of gout    2007 &  2008  LEFT LEG-- NO ISSUE SINCE  . Hyperlipidemia   . Iron deficiency anemia   . Kidney stones   . OA (osteoarthritis of spine)    LOWER BACK--  INTERMITTANT LEFT LEG NUMBNESS  . OSA (obstructive sleep apnea)    PULMOLOGIST-  DR CLANCE--  MODERATE OSA  STARTED CPAP 2012--  BUT CURRENTLY HAS NOT USED PAST 6 MONTHS  . Phimosis    a. s/p circumcision 2015.  . Type 2 diabetes mellitus ([place]   . Unspecified essential hypertension     Patient Active Problem List   Diagnosis Date Noted  . GI bleeding 09/10/2020  . CKD (chronic kidney disease), stage II   . Hepatocellular carcinoma ([place] 08/09/2020  . Routine general medical examination at a health care facility 05/06/2020  . Iron deficiency anemia   . Symptomatic anemia 04/15/2020  . Pulmonary hypertension, unspecified ([place] 03/11/2020  . Asthma, mild intermittent 12/27/2019  . Alcoholic cirrhosis of liver with  ascites ([place]) 12/27/2019  . (HFpEF) heart failure with preserved ejection fraction ([place]) 12/24/2018  . CKD (chronic kidney disease), stage III ([place]) 12/18/2017  . Peptic ulcer disease 12/18/2017  . GI bleed 09/29/2017  . Morbid obesity ([place]) 02/05/2016  . S/P CABG x 3 03/31/2014  . Hepatic cirrhosis ([place]) 02/27/2014  . Melena 08/14/2012  . Hyperlipidemia with target LDL less than 70 06/29/2012  . Thrombocytopenia ([place]) 06/29/2012  . Coronary artery disease involving native coronary artery of native heart without angina pectoris   . OSA (obstructive sleep apnea)   . Type 2 diabetes with complication ([place]) 24/23/5361  . GERD  without esophagitis 08/04/2010  . History of gout   . Hypertensive heart disease     Past Surgical History:  Procedure Laterality Date  . ANKLE FRACTURE SURGERY Right 1989   "plate put in"  . APPENDECTOMY  05-16-2004   open  . CIRCUMCISION N/A 09/09/2013   Procedure: CIRCUMCISION ADULT;  Surgeon: Bernestine Amass, MD;  Location: [facility];  Service: Urology;  Laterality: N/A;  . COLECTOMY  05-16-2004  . COLONOSCOPY WITH PROPOFOL N/A 11/20/2020   Procedure: COLONOSCOPY WITH PROPOFOL;  Surgeon: Ronald Lobo, MD;  Location: [facility] END[facility]COPY;  Service: Endoscopy;  Laterality: N/A;  . CORONARY ANGIOPLASTY WITH STENT PLACEMENT  06/28/2012  DR COOPER   PCI W/  X1 DES to [place]. LAD/  LM  40% OSTIAL & 50-60% DISTAL /  50% PROX LCX/  30-40% PROX RCA & 50% MID RCA/   LVEF 65-70%  . CORONARY ARTERY BYPASS GRAFT N/A 03/31/2014   Procedure: CORONARY ARTERY BYPASS GRAFTING (CABG) times 3 using left internal mammary artery and right saphenous vein.;  Surgeon: Melrose Nakayama, MD;  Location: [place];  Service: Open Heart Surgery;  Laterality: N/A;  . DOBUTAMINE STRESS ECHO  06-08-2012   MODERATE HYPOKINESIS/ ISCHEMIA MID INFERIOR WALL  . ESOPHAGOGASTRODUODENOSCOPY  08/15/2012   Procedure: ESOPHAGOGASTRODUODENOSCOPY (EGD);  Surgeon: Wonda Horner, MD;  Location: [facility] ENDOSCOPY;  Service: Endoscopy;  Laterality: N/A;  . ESOPHAGOGASTRODUODENOSCOPY N/A 02/17/2014   Procedure: ESOPHAGOGASTRODUODENOSCOPY (EGD);  Surgeon: Jeryl Columbia, MD;  Location: [facility] ENDOSCOPY;  Service: Endoscopy;  Laterality: N/A;  . ESOPHAGOGASTRODUODENOSCOPY N/A 04/17/2020   Procedure: ESOPHAGOGASTRODUODENOSCOPY (EGD);  Surgeon: Ronnette Juniper, MD;  Location: [facility];  Service: Gastroenterology;  Laterality: N/A;  . ESOPHAGOGASTRODUODENOSCOPY N/A 09/11/2020   Procedure: ESOPHAGOGASTRODUODENOSCOPY (EGD);  Surgeon: Clarene Essex, MD;  Location: [facility];  Service: Endoscopy;  Laterality: N/A;  . ESOPHAGOGASTRODUODENOSCOPY   10/09/2020  . ESOPHAGOGASTRODUODENOSCOPY N/A 10/09/2020   Procedure: ESOPHAGOGASTRODUODENOSCOPY (EGD);  Surgeon: Otis Brace, MD;  Location: [facility] ENDOSCOPY;  Service: Gastroenterology;  Laterality: N/A;  . ESOPHAGOGASTRODUODENOSCOPY (EGD) WITH PROPOFOL N/A 09/30/2017   Procedure: ESOPHAGOGASTRODUODENOSCOPY (EGD) WITH PROPOFOL;  Surgeon: Wonda Horner, MD;  Location: [facility] ENDOSCOPY;  Service: Endoscopy;  Laterality: N/A;  . ESOPHAGOGASTRODUODENOSCOPY (EGD) WITH PROPOFOL N/A 12/20/2017   Procedure: ESOPHAGOGASTRODUODENOSCOPY (EGD) WITH PROPOFOL;  Surgeon: Clarene Essex, MD;  Location: [place];  Service: Endoscopy;  Laterality: N/A;  . ESOPHAGOGASTRODUODENOSCOPY (EGD) WITH PROPOFOL N/A 08/10/2020   Procedure: ESOPHAGOGASTRODUODENOSCOPY (EGD) WITH PROPOFOL;  Surgeon: Wilford Corner, MD;  Location: [place];  Service: Endoscopy;  Laterality: N/A;  . ESOPHAGOGASTRODUODENOSCOPY (EGD) WITH PROPOFOL N/A 11/20/2020   Procedure: ESOPHAGOGASTRODUODENOSCOPY (EGD) WITH PROPOFOL;  Surgeon: Ronald Lobo, MD;  [facility];  Service: Endoscopy;  Laterality: N/A;  . GIVENS CAPSULE STUDY N/A 11/20/2020   Procedure: GIVENS CAPSULE STUDY;  Surgeon: Ronald Lobo, MD;  [facility];  Service:  Endoscopy;  Laterality: N/A;  . HERNIA REPAIR    . INTRAOPERATIVE TRANSESOPHAGEAL ECHOCARDIOGRAM N/A 03/31/2014   Procedure: INTRAOPERATIVE TRANSESOPHAGEAL ECHOCARDIOGRAM;  Surgeon: Melrose Nakayama, MD;  Location: [place] [facility];  Service: Open Heart Surgery;  Laterality: N/A;  . IR ANGIOGRAM SELECTIVE EACH ADDITIONAL VESSEL  06/16/2020  . IR ANGIOGRAM SELECTIVE EACH ADDITIONAL VESSEL  06/16/2020  . IR ANGIOGRAM SELECTIVE EACH ADDITIONAL VESSEL  06/16/2020  . IR ANGIOGRAM SELECTIVE EACH ADDITIONAL VESSEL  06/16/2020  . IR ANGIOGRAM SELECTIVE EACH ADDITIONAL VESSEL  06/16/2020  . IR ANGIOGRAM SELECTIVE EACH ADDITIONAL VESSEL  06/16/2020  . IR ANGIOGRAM SELECTIVE EACH ADDITIONAL VESSEL  06/16/2020  . IR  ANGIOGRAM SELECTIVE EACH ADDITIONAL VESSEL  06/16/2020  . IR ANGIOGRAM SELECTIVE EACH ADDITIONAL VESSEL  06/16/2020  . IR ANGIOGRAM SELECTIVE EACH ADDITIONAL VESSEL  06/30/2020  . IR ANGIOGRAM SELECTIVE EACH ADDITIONAL VESSEL  06/30/2020  . IR ANGIOGRAM VISCERAL SELECTIVE  06/16/2020  . IR ANGIOGRAM VISCERAL SELECTIVE  06/30/2020  . IR EMBO ARTERIAL NOT HEMORR HEMANG INC GUIDE ROADMAPPING  06/16/2020  . IR EMBO TUMOR ORGAN ISCHEMIA INFARCT INC GUIDE ROADMAPPING  06/30/2020  . IR RADIOLOGIST EVAL & MGMT  05/06/2020  . IR RADIOLOGIST EVAL & MGMT  05/20/2020  . IR US GUIDE VASC ACCESS RIGHT  06/16/2020  . IR US GUIDE VASC ACCESS RIGHT  06/30/2020  . LAPAROSCOPIC UMBILICAL HERNIA REPAIR W/ MESH  06-06-2011  . LEFT HEART CATHETERIZATION WITH CORONARY ANGIOGRAM N/A 03/28/2014   Procedure: LEFT HEART CATHETERIZATION WITH CORONARY ANGIOGRAM;  Surgeon: Sinclair Grooms, MD;  Location: [facility] CATH LAB;  Service: Cardiovascular;  Laterality: N/A;  . LIPOMA EXCISION Left 08/25/2017   Procedure: EXCISION LIPOMA LEFT POSTERIOR THIGH;  Surgeon: Clovis Riley, MD;  Location: [place];  Service: General;  Laterality: Left;  . NEPHROLITHOTOMY  1990'S  . OPEN APPENDECTOMY W/ PARTIAL CECECTOMY  05-16-2004  . PERCUTANEOUS CORONARY STENT INTERVENTION (PCI-S) N/A 06/28/2012   Procedure: PERCUTANEOUS CORONARY STENT INTERVENTION (PCI-S);  Surgeon: Sherren Mocha, MD;  [facility] CATH LAB;  Service: Cardiovascular;  Laterality: N/A;  . POLYPECTOMY  11/20/2020   Procedure: POLYPECTOMY;  Surgeon: Ronald Lobo, MD;  [facility];  Service: Endoscopy;;       Family History  Problem Relation Age of Onset  . Lung cancer Sister   . Cancer Sister        lung  . Cancer Mother   . Cancer Father        died in his 62s.  . Cancer Brother        lung  . Coronary artery disease Other   . Diabetes Other   . Colon cancer Other   . Cancer Sister        lung    Social History   Tobacco Use  . Smoking status:  Former Smoker    Packs/day: 2.00    Years: 40.00    Pack years: 80.00    Types: Cigarettes    Quit date: 08/29/1996    Years since quitting: 24.3  . Smokeless tobacco: Never Used  Vaping Use  . Vaping Use: Never used  Substance Use Topics  . Alcohol use: Yes    Alcohol/week: 8.0 standard drinks    Types: 8 Cans of beer per week    Comment: 2 beers every other day  . Drug use: No    Home Medications Prior to Admission medications   Medication Sig Start Date End Date Taking? Authorizing Provider  Accu-Chek Softclix Lancets  lancets USE AS DIRECTED TO TEST BLOOD SUGAR FOUR TIMES DAILY 03/13/20   [name], MD  albuterol (VENTOLIN HFA) 108 (90 Base) MCG/ACT inhaler Inhale 2 puffs into the lungs every 6 (six) hours as needed for wheezing or shortness of breath. 03/12/20   [name], MD  atorvastatin (LIPITOR) 40 MG tablet TAKE 1 TABLET BY MOUTH DAILY AT 6 PM 01/04/21   [name], MD  blood glucose meter kit and supplies Dispense based on patient and insurance preference. Use up to four times daily as directed. (FOR ICD-10 E10.9, E11.9). 03/12/20   [name], MD  gabapentin (NEURONTIN) 100 MG capsule TAKE 1 CAPSULE(100 MG) BY MOUTH TWICE DAILY Patient taking differently: Take 100 mg by mouth 2 (two) times daily. 08/03/20   [name], MD  glimepiride (AMARYL) 2 MG tablet TAKE 1 TABLET(2 MG) BY MOUTH DAILY BEFORE BREAKFAST 12/16/20   [name], MD  hydrocortisone (ANUSOL-HC) 2.5 % rectal cream Place 1 application rectally 2 (two) times daily. 12/04/20   [name], MD  metFORMIN (GLUCOPHAGE) 850 MG tablet TAKE 1 TABLET(850 MG) BY MOUTH TWICE DAILY WITH A MEAL Patient taking differently: Take 850 mg by mouth 2 (two) times daily with a meal. 06/16/20   [name], MD  nitroGLYCERIN (NITROSTAT) 0.4 MG SL tablet Place 1 tablet (0.4 mg total) under the tongue every 5 (five) minutes as needed for chest pain. 06/08/16    Nahser, Wonda Cheng, MD  pantoprazole (PROTONIX) 40 MG tablet Take 1 tablet (40 mg total) by mouth 2 (two) times daily. Patient not taking: Reported on 12/10/2020 09/12/20   Pokhrel, Corrie Mckusick, MD  potassium chloride (KLOR-CON) 20 MEQ tablet Take 1 tablet (20 mEq total) by mouth daily. 08/18/20   Danford, Suann Larry, MD  Psyllium (METAMUCIL PO) Take 15 mLs by mouth 3 (three) times daily with meals. Mix with 4 oz of juice or water    [provider]  spironolactone (ALDACTONE) 25 MG tablet Take 1 tablet (25 mg total) by mouth daily. 08/18/20   Danford, Suann Larry, MD  sucralfate (CARAFATE) 1 g tablet Take 1 tablet (1 g total) by mouth 4 (four) times daily. 11/23/20 12/23/20  Elodia Florence., MD  torsemide (DEMADEX) 20 MG tablet Take 2 tablets (40 mg total) by mouth 2 (two) times daily. 08/18/20   Danford, Suann Larry, MD  umeclidinium-vilanterol (ANORO ELLIPTA) 62.5-25 MCG/INH AEPB Inhale 1 puff into the lungs daily. 03/12/20   [name], MD    Allergies    Other  Review of Systems   Review of Systems  All other systems reviewed and are negative.   Physical Exam Updated Vital Signs BP (!) 138/55 (BP Location: Left Arm)   Pulse 69   Temp 98.2 F (36.8 C) (Oral)   Resp 16   Ht 1.727 m ('5\' 8"' )   Wt 102 kg   SpO2 100%   BMI 34.19 kg/m   Physical Exam Vitals and nursing note reviewed.  Constitutional:      General: He is not in acute distress.    Appearance: He is well-developed. He is obese. He is ill-appearing.  HENT:     Head: Normocephalic.     Mouth/Throat:     Mouth: Mucous membranes are moist.  Eyes:     Pupils: Pupils are equal, round, and reactive to light.  Cardiovascular:     Rate and Rhythm: Normal rate and regular rhythm.  Pulmonary:  Effort: Pulmonary effort is normal.     Breath sounds: Examination of the right-lower field reveals rhonchi. Examination of the left-lower field reveals rhonchi. Rhonchi present.  Chest:     Chest wall:  No mass or deformity.  Musculoskeletal:        General: Normal range of motion.     Cervical back: Normal range of motion.     Right lower leg: Edema present.     Left lower leg: Edema present.  Skin:    General: Skin is warm and dry.     Capillary Refill: Capillary refill takes less than 2 seconds.  Neurological:     General: No focal deficit present.     Mental Status: He is alert.  Psychiatric:        Mood and Affect: Mood normal.        Behavior: Behavior normal.     ED Results / Procedures / Treatments   Labs (all labs ordered are listed, but only abnormal results are displayed) Labs Reviewed  RESP PANEL BY RT-PCR (FLU A&B, COVID) [id number]  CBC  COMPREHENSIVE METABOLIC PANEL  BRAIN NATRIURETIC PEPTIDE    EKG EKG Interpretation  Date/Time:  Wednesday Jan 06 2021 16:09:47 EDT Ventricular Rate:  67 PR Interval:    QRS Duration: 123 QT Interval:  457 QTC Calculation: 483 R Axis:   99 Text Interpretation: Junctional rhythm RBBB and LPFB Nonspecific T abnormalities, lateral leads Confirmed by Pattricia Boss [phone]) on 01/06/2021 5:03:22 PM   Radiology DG Chest Port 1 View  Result Date: 01/06/2021 CLINICAL DATA:  Dyspnea.  Shortness of breath. EXAM: PORTABLE CHEST 1 VIEW COMPARISON:  11/20/2020 FINDINGS: Post median sternotomy and CABG. The heart is enlarged. Stable mediastinal contours. Interstitial edema which appears similar to prior exam. Probable right pleural effusion. No confluent consolidation or pneumothorax. Stable osseous structures. IMPRESSION: Cardiomegaly with interstitial edema, similar to prior exam. Probable right pleural effusion. Electronically Signed   By: Keith Rake M.D.   On: 01/06/2021 16:50    Procedures .Critical Care Performed by: Pattricia Boss, MD Authorized by: Pattricia Boss, MD   Critical care provider statement:    Critical care time (minutes):  45   Critical care end time:  01/06/2021 6:03 PM   Critical care was necessary to treat  or prevent imminent or life-threatening deterioration of the following conditions:  Circulatory failure   Critical care was time spent personally by me on the following activities:  Discussions with consultants, evaluation of patient's response to treatment, examination of patient, ordering and performing treatments and interventions, ordering and review of laboratory studies, ordering and review of radiographic studies, pulse oximetry, re-evaluation of patient's condition, obtaining history from patient or surrogate and review of old charts     Medications Ordered in ED Medications  furosemide (LASIX) injection 60 mg (has no administration in time range)    ED Course  I have reviewed the triage vital signs and the nursing notes.  Pertinent labs & imaging results that were available during my care of the patient were reviewed by me and considered in my medical decision making (see chart for details).    MDM Rules/Calculators/A&P                          75 year old male known CHF presents today with increased edema and dyspnea.  On exam he has crackles at bilateral bases.  He has some increased work of breathing.  He does not have new  oxygen requirement.  Chest x-Elorah Dewing shows cardiomegaly with interstitial edema.  Patient has received Lasix IV here in the ED. 1-dyspnea-likely multifactorial secondary to #2 and #3 2.  Patient with CHF with increased markings on chest x-Lylah Lantis Lasix given 3 severe anemia with hemoglobin 4 patient is having 2 units of packed red blood cells infused 4-heme positive stool-patient has had recent upper endoscopy and colonoscopy by [facility] with out source of bleeding noted on exams. Discussed with Dr. Posey Pronto who will see for admission Final Clinical Impression(s) / ED Diagnoses Final diagnoses:  Anemia, unspecified type  Congestive heart failure, unspecified HF chronicity, unspecified heart failure type [facility])    Rx / DC Orders ED Discharge Orders    None        Pattricia Boss, MD 01/06/21 1849

## 2021-01-07 ENCOUNTER — Telehealth: Payer: Medicare HMO

## 2021-01-07 LAB — HEPATIC FUNCTION PANEL
ALT: 14 U/L (ref 0–44)
AST: 18 U/L (ref 15–41)
Albumin: 3.6 g/dL (ref 3.5–5.0)
Alkaline Phosphatase: 101 U/L (ref 38–126)
Bilirubin, Direct: 0.3 mg/dL — ABNORMAL HIGH (ref 0.0–0.2)
Indirect Bilirubin: 1.6 mg/dL — ABNORMAL HIGH (ref 0.3–0.9)
Total Bilirubin: 1.9 mg/dL — ABNORMAL HIGH (ref 0.3–1.2)
Total Protein: 6.7 g/dL (ref 6.5–8.1)

## 2021-01-07 LAB — GLUCOSE, CAPILLARY
Glucose-Capillary: 50 mg/dL — ABNORMAL LOW (ref 70–99)
Glucose-Capillary: 130 mg/dL — ABNORMAL HIGH (ref 70–99)
Glucose-Capillary: 85 mg/dL (ref 70–99)
Glucose-Capillary: 65 mg/dL — ABNORMAL LOW (ref 70–99)
Glucose-Capillary: 70 mg/dL (ref 70–99)
Glucose-Capillary: 103 mg/dL — ABNORMAL HIGH (ref 70–99)
Glucose-Capillary: 109 mg/dL — ABNORMAL HIGH (ref 70–99)
Glucose-Capillary: 106 mg/dL — ABNORMAL HIGH (ref 70–99)
Glucose-Capillary: 230 mg/dL — ABNORMAL HIGH (ref 70–99)

## 2021-01-07 LAB — PROTIME-INR
INR: 1.2 (ref 0.8–1.2)
Prothrombin Time: 15.5 seconds — ABNORMAL HIGH (ref 11.4–15.2)

## 2021-01-07 LAB — MAGNESIUM: Magnesium: 2.2 mg/dL (ref 1.7–2.4)

## 2021-01-07 LAB — BASIC METABOLIC PANEL
Anion gap: 7 (ref 5–15)
BUN: 32 mg/dL — ABNORMAL HIGH (ref 8–23)
CO2: 24 mmol/L (ref 22–32)
Calcium: 8.6 mg/dL — ABNORMAL LOW (ref 8.9–10.3)
Chloride: 104 mmol/L (ref 98–111)
Creatinine, Ser: 1.2 mg/dL (ref 0.61–1.24)
GFR, Estimated: >60 mL/min (ref >60)
Glucose, Bld: 70 mg/dL (ref 70–99)
Potassium: 3.5 mmol/L (ref 3.5–5.1)
Sodium: 135 mmol/L (ref 135–145)

## 2021-01-07 LAB — FERRITIN: Ferritin: 6 ng/mL — ABNORMAL LOW (ref 24–336)

## 2021-01-07 LAB — HEMOGLOBIN A1C
Hgb A1c MFr Bld: 5.4 % (ref 4.8–5.6)
Mean Plasma Glucose: 108.28 mg/dL

## 2021-01-07 LAB — FOLATE: Folate: 15 ng/mL (ref >5.9)

## 2021-01-07 LAB — CBC
HCT: 22 % — ABNORMAL LOW (ref 39.0–52.0)
Hemoglobin: 6.2 g/dL — CL (ref 13.0–17.0)
MCH: 21.8 pg — ABNORMAL LOW (ref 26.0–34.0)
MCHC: 28.2 g/dL — ABNORMAL LOW (ref 30.0–36.0)
MCV: 77.5 fL — ABNORMAL LOW (ref 80.0–100.0)
Platelets: 131 10*3/uL — ABNORMAL LOW (ref 150–400)
RBC: 2.84 MIL/uL — ABNORMAL LOW (ref 4.22–5.81)
RDW: 19.6 % — ABNORMAL HIGH (ref 11.5–15.5)
WBC: 3.2 10*3/uL — ABNORMAL LOW (ref 4.0–10.5)
nRBC: 0 % (ref 0.0–0.2)

## 2021-01-07 LAB — HEMOGLOBIN AND HEMATOCRIT, BLOOD
HCT: 23.7 % — ABNORMAL LOW (ref 39.0–52.0)
Hemoglobin: 6.8 g/dL — CL (ref 13.0–17.0)

## 2021-01-07 LAB — VITAMIN B12: Vitamin B-12: 342 pg/mL (ref 180–914)

## 2021-01-07 LAB — IRON AND TIBC
Iron: 56 ug/dL (ref 45–182)
Saturation Ratios: 10 % — ABNORMAL LOW (ref 17.9–39.5)
TIBC: 577 ug/dL — ABNORMAL HIGH (ref 250–450)
UIBC: 521 ug/dL

## 2021-01-07 LAB — PREPARE RBC (CROSSMATCH)

## 2021-01-07 MED ORDER — SODIUM CHLORIDE 0.9% IV SOLUTION: Freq: Once | INTRAVENOUS | Status: DC

## 2021-01-07 MED ORDER — FUROSEMIDE 10 MG/ML IJ SOLN
20.0000 mg | Freq: Once | INTRAMUSCULAR | Status: AC
  Administered 2021-01-07: 20 mg via INTRAVENOUS
  Filled 2021-01-07: qty 2

## 2021-01-07 MED ORDER — DEXTROSE 50 % IV SOLN
25.0000 g | INTRAVENOUS | Status: AC
  Administered 2021-01-07: 25 g via INTRAVENOUS
  Filled 2021-01-07: qty 50

## 2021-01-07 MED ORDER — GLUCOSE 40 % PO GEL
ORAL | Status: AC
  Administered 2021-01-07: 37.5 g
  Filled 2021-01-07: qty 1.21

## 2021-01-07 MED ORDER — DEXTROSE 10 % IV SOLN: INTRAVENOUS | Status: DC

## 2021-01-07 MED ORDER — SODIUM CHLORIDE 0.9 % IV SOLN
50.0000 ug/h | INTRAVENOUS | Status: DC
  Administered 2021-01-07 – 2021-01-15 (×14): 50 ug/h via INTRAVENOUS
  Filled 2021-01-07 (×21): qty 1

## 2021-01-07 MED ORDER — SODIUM CHLORIDE 0.9% IV SOLUTION: Freq: Once | INTRAVENOUS | Status: AC

## 2021-01-07 MED ORDER — SODIUM CHLORIDE 0.9 % IV SOLN
2.0000 g | INTRAVENOUS | Status: DC
  Administered 2021-01-07 – 2021-01-14 (×6): 2 g via INTRAVENOUS
  Filled 2021-01-07: qty 20
  Filled 2021-01-07: qty 2
  Filled 2021-01-07 (×6): qty 20

## 2021-01-07 NOTE — Progress Notes (Addendum)
Hypoglycemic event this AM CBG 50.  Patient takes Amaryl at home 2 mg daily.  Hypoglycemia protocol applied.  Ordered D10W at 30cc/hr x1 day to prevent recurrent hypoglycemia in the setting of Amaryl use.  Addendum: Sulfonylurea toxicity in the setting of AKI: Check fingerstick every hour with a goal 100-110.

## 2021-01-07 NOTE — Progress Notes (Incomplete)
Hypoglycemic Event  CBG: 65  Treatment: {Hypoglycemic Treatment (must also place order and document administration):3049002}  Symptoms: {Hypoglycemic Symptoms:3049003}  Follow-up CBG: Time:*** CBG Result:***  Possible Reasons for Event: {Possible Reasons for YWVXU:2767011}  Comments/MD notified:***    Rosana Fret

## 2021-01-07 NOTE — Progress Notes (Signed)
Hypoglycemic Event  CBG: 50  Treatment: D50 50 mL (25 gm)  Symptoms: Pale  Follow-up CBG: Time:0530 CBG Result:130  Possible Reasons for Event: Inadequate meal intake   [name] C [name]

## 2021-01-07 NOTE — H&P (View-Only) (Signed)
Referring Provider: [facility] Primary Care Physician:  [name], MD Primary Gastroenterologist:  [facility]   Reason for Consultation:  Anemia  HPI: [name] is a 75 y.o. male with past medical history noted below to includehepatic cirrhosis,HCC, history of GI bleed with(gastric AVMsin 2019, portal hypertensive gastropathy and grade 1 esophageal varices in 2021), type 1 isolated gastric varices,GERD/Barrett's esophagus, CAD s/p CABG, HFpEF (EF 55-60%as of12/2020)presenting for consultation of symptomatic anemia.  Patient presented to the ED yesterday with symptomatic anemia.  He reports recent weakness, shortness of breath with exertion, and fatigue.  He denies melena or hematochezia to me, but per hospitalist note, he reported "black-colored stools without blood."  He denies any abdominal pain, nausea, vomiting, hematemesis, changes in appetite, or unexplained weight loss.  Denies ASA, NSAID, or blood thinner use.  Last EGD 11/20/20: Minimal spontaneious oozing from body of stomach, possibly localized portal gastropathy or gastritis, without blood in stomach at start of procedure, therefore this finding is of uncertain significance. Grade I esophageal varices. Hemorrhagic gastropathy as described above. Type 1 isolated gastric varices (IGV1, varices located in the fundus), without bleeding.  Normal examined duodenum.  Last colonoscopy 11/20/20: No definite source of patient's recurrent anemia seen on this study. Two diminutive polyps in the transverse colon, removed with a cold snare. Resected and retrieved. One 4 mm polyp in the distal rectum, removed with a cold snare. Resected and retrieved. Erythematous mucosa in the proximal ascending colon.   Past Medical History:  Diagnosis Date  . Arthritis    "joints tighten up sometimes" (03/27/2104)  . Carcinoma of liver, hepatocellular ([place]) 06/30/2020  . CHF (congestive heart failure) ([place])   . Chronic lower back pain   . Coronary  artery disease    a. 05/2012 Cath/PCI: LM 40ost, 50-60d, LAD 99p ruptured plaque (3.0x28 DES), LCX 50p/m, RCA 30-40p, 69m EF 65-70%.  . Coronary artery disease involving native coronary artery of native heart without angina pectoris    Severe left main disease at catheterization July 2015  CABG x3 with a LIMA to the LAD, SVG to the OM, SVG to the PDA on 03/31/14. EF 60% by cath.   . Diastolic heart failure ([place]   . GERD without esophagitis 08/04/2010  . Hepatic cirrhosis ([place]    a. Dx 01/2014 - CT a/p   . History of blood transfusion    "related to bleeding ulcers"  . History of concussion    1976--  NO RESIDUAL  . History of GI bleed    a. UGIB 07/2012;  b. 01/2014 admission with GIB/FOB stool req 1U prbc's->EGD showed portal gastropathy, barrett's esoph, and chronic active h. pylori gastritis.  .Marland KitchenHistory of gout    2007 &  2008  LEFT LEG-- NO ISSUE SINCE  . Hyperlipidemia   . Iron deficiency anemia   . Kidney stones   . OA (osteoarthritis of spine)    LOWER BACK--  INTERMITTANT LEFT LEG NUMBNESS  . OSA (obstructive sleep apnea)    PULMOLOGIST-  DR CLANCE--  MODERATE OSA  STARTED CPAP 2012--  BUT CURRENTLY HAS NOT USED PAST 6 MONTHS  . Phimosis    a. s/p circumcision 2015.  . Type 2 diabetes mellitus ([place]   . Unspecified essential hypertension     Past Surgical History:  Procedure Laterality Date  . ANKLE FRACTURE SURGERY Right 1989   "plate put in"  . APPENDECTOMY  05-16-2004   open  . CIRCUMCISION N/A 09/09/2013   Procedure: CIRCUMCISION ADULT;  Surgeon: Bernestine Amass, MD;  Location: [facility];  Service: Urology;  Laterality: N/A;  . COLECTOMY  05-16-2004  . COLONOSCOPY WITH PROPOFOL N/A 11/20/2020   Procedure: COLONOSCOPY WITH PROPOFOL;  Surgeon: Ronald Lobo, MD;  Location: [facility] ENDOSCOPY;  Service: Endoscopy;  Laterality: N/A;  . CORONARY ANGIOPLASTY WITH STENT PLACEMENT  06/28/2012  DR COOPER   PCI W/  X1 DES to [place]. LAD/  LM  40% OSTIAL & 50-60%  DISTAL /  50% PROX LCX/  30-40% PROX RCA & 50% MID RCA/   LVEF 65-70%  . CORONARY ARTERY BYPASS GRAFT N/A 03/31/2014   Procedure: CORONARY ARTERY BYPASS GRAFTING (CABG) times 3 using left internal mammary artery and right saphenous vein.;  Surgeon: Melrose Nakayama, MD;  Location: [place];  Service: Open Heart Surgery;  Laterality: N/A;  . DOBUTAMINE STRESS ECHO  06-08-2012   MODERATE HYPOKINESIS/ ISCHEMIA MID INFERIOR WALL  . ESOPHAGOGASTRODUODENOSCOPY  08/15/2012   Procedure: ESOPHAGOGASTRODUODENOSCOPY (EGD);  Surgeon: Wonda Horner, MD;  Location[facility] ENDOSCOPY;  Service: Endoscopy;  Laterality: N/A;  . ESOPHAGOGASTRODUODENOSCOPY N/A 02/17/2014   Procedure: ESOPHAGOGASTRODUODENOSCOPY (EGD);  Surgeon: Jeryl Columbia, MD;  Location: [facility] ENDOSCOPY;  Service: Endoscopy;  Laterality: N/A;  . ESOPHAGOGASTRODUODENOSCOPY N/A 04/17/2020   Procedure: ESOPHAGOGASTRODUODENOSCOPY (EGD);  Surgeon: Ronnette Juniper, MD;  Location: [place];  Service: Gastroenterology;  Laterality: N/A;  . ESOPHAGOGASTRODUODENOSCOPY N/A 09/11/2020   Procedure: ESOPHAGOGASTRODUODENOSCOPY (EGD);  Surgeon: Clarene Essex, MD;  Location: [facility];  Service: Endoscopy;  Laterality: N/A;  . ESOPHAGOGASTRODUODENOSCOPY  10/09/2020  . ESOPHAGOGASTRODUODENOSCOPY N/A 10/09/2020   Procedure: ESOPHAGOGASTRODUODENOSCOPY (EGD);  Surgeon: Otis Brace, MD;  Location: [facility] ENDOSCOPY;  Service: Gastroenterology;  Laterality: N/A;  . ESOPHAGOGASTRODUODENOSCOPY (EGD) WITH PROPOFOL N/A 09/30/2017   Procedure: ESOPHAGOGASTRODUODENOSCOPY (EGD) WITH PROPOFOL;  Surgeon: Wonda Horner, MD;  Location: [facility] ENDOSCOPY;  Service: Endoscopy;  Laterality: N/A;  . ESOPHAGOGASTRODUODENOSCOPY (EGD) WITH PROPOFOL N/A 12/20/2017   Procedure: ESOPHAGOGASTRODUODENOSCOPY (EGD) WITH PROPOFOL;  Surgeon: Clarene Essex, MD;  Location: [place];  Service: Endoscopy;  Laterality: N/A;  . ESOPHAGOGASTRODUODENOSCOPY (EGD) WITH PROPOFOL N/A 08/10/2020   Procedure:  ESOPHAGOGASTRODUODENOSCOPY (EGD) WITH PROPOFOL;  Surgeon: Wilford Corner, MD;  Location: [place];  Service: Endoscopy;  Laterality: N/A;  . ESOPHAGOGASTRODUODENOSCOPY (EGD) WITH PROPOFOL N/A 11/20/2020   Procedure: ESOPHAGOGASTRODUODENOSCOPY (EGD) WITH PROPOFOL;  Surgeon: Ronald Lobo, MD;  [facility];  Service: Endoscopy;  Laterality: N/A;  . GIVENS CAPSULE STUDY N/A 11/20/2020   Procedure: GIVENS CAPSULE STUDY;  Surgeon: Ronald Lobo, MD;  [facility];  Service: Endoscopy;  Laterality: N/A;  . HERNIA REPAIR    . INTRAOPERATIVE TRANSESOPHAGEAL ECHOCARDIOGRAM N/A 03/31/2014   Procedure: INTRAOPERATIVE TRANSESOPHAGEAL ECHOCARDIOGRAM;  Surgeon: Melrose Nakayama, MD;  Location: [employer];  Service: Open Heart Surgery;  Laterality: N/A;  . IR ANGIOGRAM SELECTIVE EACH ADDITIONAL VESSEL  06/16/2020  . IR ANGIOGRAM SELECTIVE EACH ADDITIONAL VESSEL  06/16/2020  . IR ANGIOGRAM SELECTIVE EACH ADDITIONAL VESSEL  06/16/2020  . IR ANGIOGRAM SELECTIVE EACH ADDITIONAL VESSEL  06/16/2020  . IR ANGIOGRAM SELECTIVE EACH ADDITIONAL VESSEL  06/16/2020  . IR ANGIOGRAM SELECTIVE EACH ADDITIONAL VESSEL  06/16/2020  . IR ANGIOGRAM SELECTIVE EACH ADDITIONAL VESSEL  06/16/2020  . IR ANGIOGRAM SELECTIVE EACH ADDITIONAL VESSEL  06/16/2020  . IR ANGIOGRAM SELECTIVE EACH ADDITIONAL VESSEL  06/16/2020  . IR ANGIOGRAM SELECTIVE EACH ADDITIONAL VESSEL  06/30/2020  . IR ANGIOGRAM SELECTIVE EACH ADDITIONAL VESSEL  06/30/2020  . IR ANGIOGRAM VISCERAL SELECTIVE  06/16/2020  . IR ANGIOGRAM VISCERAL  SELECTIVE  06/30/2020  . IR EMBO ARTERIAL NOT HEMORR HEMANG INC GUIDE ROADMAPPING  06/16/2020  . IR EMBO TUMOR ORGAN ISCHEMIA INFARCT INC GUIDE ROADMAPPING  06/30/2020  . IR RADIOLOGIST EVAL & MGMT  05/06/2020  . IR RADIOLOGIST EVAL & MGMT  05/20/2020  . IR US GUIDE VASC ACCESS RIGHT  06/16/2020  . IR US GUIDE VASC ACCESS RIGHT  06/30/2020  . LAPAROSCOPIC UMBILICAL HERNIA REPAIR W/ MESH  06-06-2011  . LEFT  HEART CATHETERIZATION WITH CORONARY ANGIOGRAM N/A 03/28/2014   Procedure: LEFT HEART CATHETERIZATION WITH CORONARY ANGIOGRAM;  Surgeon: Sinclair Grooms, MD;  Location: [facility] CATH LAB;  Service: Cardiovascular;  Laterality: N/A;  . LIPOMA EXCISION Left 08/25/2017   Procedure: EXCISION LIPOMA LEFT POSTERIOR THIGH;  Surgeon: Clovis Riley, MD;  Location: [place];  Service: General;  Laterality: Left;  . NEPHROLITHOTOMY  1990'S  . OPEN APPENDECTOMY W/ PARTIAL CECECTOMY  05-16-2004  . PERCUTANEOUS CORONARY STENT INTERVENTION (PCI-S) N/A 06/28/2012   Procedure: PERCUTANEOUS CORONARY STENT INTERVENTION (PCI-S);  Surgeon: Sherren Mocha, MD;  Location: [facility] CATH LAB;  Service: Cardiovascular;  Laterality: N/A;  . POLYPECTOMY  11/20/2020   Procedure: POLYPECTOMY;  Surgeon: Ronald Lobo, MD;  [facility]OS[facility];  Service: Endoscopy;;    Prior to Admission medications   Medication Sig Start Date End Date Taking? Authorizing Provider  albuterol (VENTOLIN HFA) 108 (90 Base) MCG/ACT inhaler Inhale 2 puffs into the lungs every 6 (six) hours as needed for wheezing or shortness of breath. 03/12/20  Yes [name], MD  atorvastatin (LIPITOR) 40 MG tablet TAKE 1 TABLET BY MOUTH DAILY AT 6 PM Patient taking differently: Take 40 mg by mouth daily at 6 PM. 01/04/21  Yes [name], MD  gabapentin (NEURONTIN) 100 MG capsule TAKE 1 CAPSULE(100 MG) BY MOUTH TWICE DAILY Patient taking differently: Take 100 mg by mouth 2 (two) times daily. 08/03/20  Yes [name], MD  glimepiride (AMARYL) 2 MG tablet TAKE 1 TABLET(2 MG) BY MOUTH DAILY BEFORE BREAKFAST Patient taking differently: Take 2 mg by mouth daily with breakfast. 12/16/20  Yes [name], MD  hydrocortisone (ANUSOL-HC) 2.5 % rectal cream Place 1 application rectally 2 (two) times daily. Patient taking differently: Place 1 application rectally 2 (two) times daily as needed for hemorrhoids. 12/04/20  Yes [name], MD  metFORMIN (GLUCOPHAGE) 850 MG tablet TAKE 1 TABLET(850 MG) BY MOUTH TWICE DAILY WITH A MEAL Patient taking differently: Take 850 mg by mouth in the morning and at bedtime. 06/16/20  Yes [name], MD  nitroGLYCERIN (NITROSTAT) 0.4 MG SL tablet Place 1 tablet (0.4 mg total) under the tongue every 5 (five) minutes as needed for chest pain. 06/08/16  Yes Nahser, Wonda Cheng, MD  polyethylene glycol powder (GLYCOLAX/MIRALAX) 17 GM/SCOOP powder Take 8.5 g by mouth in the morning, at noon, and at bedtime.   Yes [provider]  spironolactone (ALDACTONE) 25 MG tablet Take 1 tablet (25 mg total) by mouth daily. 08/18/20  Yes Danford, Suann Larry, MD  sucralfate (CARAFATE) 1 g tablet Take 1 tablet (1 g total) by mouth 4 (four) times daily. Patient taking differently: Take 2 g by mouth in the morning and at bedtime. 11/23/20 12/23/20 Yes Elodia Florence., MD  torsemide (DEMADEX) 20 MG tablet Take 2 tablets (40 mg total) by mouth 2 (two) times daily. 08/18/20  Yes Danford, Suann Larry, MD  umeclidinium-vilanterol (ANORO ELLIPTA) 62.5-25 MCG/INH AEPB Inhale 1 puff into the lungs  daily. 03/12/20  Yes [name], MD  Accu-Chek Softclix Lancets lancets USE AS DIRECTED TO TEST BLOOD SUGAR FOUR TIMES DAILY 03/13/20   [name], MD  blood glucose meter kit and supplies Dispense based on patient and insurance preference. Use up to four times daily as directed. (FOR ICD-10 E10.9, E11.9). 03/12/20   [name], MD  pantoprazole (PROTONIX) 40 MG tablet Take 1 tablet (40 mg total) by mouth 2 (two) times daily. Patient not taking: No sig reported 09/12/20   Pokhrel, Corrie Mckusick, MD  potassium chloride (KLOR-CON) 20 MEQ tablet Take 1 tablet (20 mEq total) by mouth daily. Patient not taking: No sig reported 08/18/20   Edwin Dada, MD  Psyllium (METAMUCIL PO) Take 15 mLs by mouth 3 (three) times daily with meals. Mix with 4 oz of juice or water    [provider]    Scheduled Meds: . sodium chloride   Intravenous Once  . atorvastatin  40 mg Oral QHS  . furosemide  20 mg Intravenous Once  . pantoprazole (PROTONIX) IV  40 mg Intravenous Q12H  . sodium chloride flush  3 mL Intravenous Q12H  . umeclidinium-vilanterol  1 puff Inhalation Daily   Continuous Infusions: . sodium chloride Stopped (01/06/21 1728)  . dextrose 30 mL/hr at 01/07/21 0520   PRN Meds:.acetaminophen **OR** acetaminophen, albuterol, ondansetron **OR** ondansetron (ZOFRAN) IV  Allergies as of 01/06/2021 - Review Complete 01/06/2021  Allergen Reaction Noted  . Other  11/18/2020    Family History  Problem Relation Age of Onset  . Lung cancer Sister   . Cancer Sister        lung  . Cancer Mother   . Cancer Father        died in his 1s.  . Cancer Brother        lung  . Coronary artery disease Other   . Diabetes Other   . Colon cancer Other   . Cancer Sister        lung    Social History   Socioeconomic History  . Marital status: Married    Spouse name: Not on file  . Number of children: Not on file  . Years of education: Not on file  . Highest education level: Not on file  Occupational History  . Not on file  Tobacco Use  . Smoking status: Former Smoker    Packs/day: 2.00    Years: 40.00    Pack years: 80.00    Types: Cigarettes    Quit date: 08/29/1996    Years since quitting: 24.3  . Smokeless tobacco: Never Used  Vaping Use  . Vaping Use: Never used  Substance and Sexual Activity  . Alcohol use: Yes    Alcohol/week: 8.0 standard drinks    Types: 8 Cans of beer per week    Comment: 2 beers every other day  . Drug use: No  . Sexual activity: Yes  Other Topics Concern  . Not on file  Social History Narrative   Lives in [place] with wife, son, and son's family.   Social Determinants of Health   Financial Resource Strain: Medium Risk  . Difficulty of Paying Living Expenses: Somewhat hard  Food Insecurity: No Food Insecurity  .  Worried About Charity fundraiser in the Last Year: Never true  . Ran Out of Food in the Last Year: Never true  Transportation Needs: No Transportation Needs  . Lack of Transportation (Medical): No  . Lack  of Transportation (Non-Medical): No  Physical Activity: Not on file  Stress: Not on file  Social Connections: Not on file  Intimate Partner Violence: Not on file    Review of Systems: Review of Systems  Constitutional: Positive for malaise/fatigue. Negative for chills, fever and weight loss.  HENT: Negative for hearing loss and tinnitus.   Eyes: Negative for pain and redness.  Respiratory: Positive for shortness of breath. Negative for cough.   Cardiovascular: Negative for chest pain and palpitations.  Gastrointestinal: Negative for abdominal pain, blood in stool, constipation, diarrhea, heartburn, melena, nausea and vomiting.  Genitourinary: Negative for flank pain and hematuria.  Musculoskeletal: Negative for falls and joint pain.  Skin: Negative for itching and rash.  Neurological: Negative for seizures and loss of consciousness.  Endo/Heme/Allergies: Negative for polydipsia. Does not bruise/bleed easily.  Psychiatric/Behavioral: Negative for substance abuse. The patient is not nervous/anxious.     Physical Exam: Vital signs: Vitals:   01/07/21 0504 01/07/21 0929  BP: 113/61 (!) 110/56  Pulse: 66 64  Resp: 16 18  Temp: 98 F (36.7 C) 97.8 F (36.6 C)  SpO2: 96% 100%   Last BM Date: 01/06/21  Physical Exam Vitals reviewed.  Constitutional:      General: He is not in acute distress. HENT:     Head: Normocephalic and atraumatic.     Nose: Nose normal. No congestion.  Eyes:     General: No scleral icterus.    Extraocular Movements: Extraocular movements intact.     Comments: Conjunctival pallor  Cardiovascular:     Rate and Rhythm: Normal rate and regular rhythm.  Pulmonary:     Effort: Pulmonary effort is normal. No respiratory distress.  Abdominal:      General: There is distension.     Palpations: Abdomen is soft. There is no mass.     Tenderness: There is no abdominal tenderness. There is no guarding or rebound.     Hernia: No hernia is present.  Musculoskeletal:     Cervical back: Normal range of motion and neck supple.     Right lower leg: Edema present.     Left lower leg: Edema present.  Skin:    General: Skin is warm and dry.  Neurological:     General: No focal deficit present.     Mental Status: He is alert and oriented to person, place, and time.  Psychiatric:        Mood and Affect: Mood normal.        Behavior: Behavior normal. Behavior is cooperative.      GI:  Lab Results: Recent Labs    01/06/21 1630 01/07/21 0725  WBC 3.4* 3.2*  HGB 4.8* 6.2*  HCT 18.5* 22.0*  PLT 133* 131*   BMET Recent Labs    01/06/21 1630 01/07/21 0725  NA 136 135  K 3.6 3.5  CL 104 104  CO2 24 24  GLUCOSE 73 70  BUN 34* 32*  CREATININE 1.33* 1.20  CALCIUM 8.5* 8.6*   LFT Recent Labs    01/06/21 1630  PROT 7.1  ALBUMIN 3.7  AST 19  ALT 15  ALKPHOS 96  BILITOT 0.7   PT/INR No results for input(s): LABPROT, INR in the last 72 hours.   Studies/Results: DG Chest Port 1 View  Result Date: 01/06/2021 CLINICAL DATA:  Dyspnea.  Shortness of breath. EXAM: PORTABLE CHEST 1 VIEW COMPARISON:  11/20/2020 FINDINGS: Post median sternotomy and CABG. The heart is enlarged. Stable mediastinal contours. Interstitial edema which  appears similar to prior exam. Probable right pleural effusion. No confluent consolidation or pneumothorax. Stable osseous structures. IMPRESSION: Cardiomegaly with interstitial edema, similar to prior exam. Probable right pleural effusion. Electronically Signed   By: Keith Rake M.D.   On: 01/06/2021 16:50    Impression: Suspected UpperGI bleeding:history of portal hypertensive gastropathy, Grade 1 esophageal varices, and isolated gastric varix per EGD 07/2020. History of gastric AVMs, which is most  likely source of bleeding, given current stability without overt bleeding with the exception of one episode of melena reported to hospitalist.  -Hgb 4.8 on arrival, decreased from 7.7 on 12/04/20 -INR ordered, pending  Hepatocellular carcinoma s/p radiation   Hepatic cirrhosis, presumably alcohol-related -Normal LFTs as of 01/06/21. T. Bili 0.7/ AST 19/ ALT 15/ ALP 96 -INR pending -Platelets131 K/uL  Plan: EGD tomorrow. I thoroughly discussed the procedure with the patient to include nature, alternatives, benefits, and risks (including but not limited to bleeding, infection, perforation, anesthesia/cardiac and pulmonary complications). Patient verbalized understanding gave verbal consent to proceed with EGD.   Clear liquid diet, NPO after midnight.  StartOctreotide. Continue Protonix.  Ceftriaxone ordered, given GI bleeding in the setting of cirrhosis.  Continue to monitor CBC with transfusion as needed to maintain hemoglobin>7.   LOS: 1 day   Salley Slaughter  PA-C 01/07/2021, 9:51 AM  Contact #  [phone]

## 2021-01-07 NOTE — Consult Note (Signed)
Referring Provider: [facility] Primary Care Physician:  [name], MD Primary Gastroenterologist:  [facility]   Reason for Consultation:  Anemia  HPI: [name] is a 75 y.o. male with past medical history noted below to includehepatic cirrhosis,HCC, history of GI bleed with(gastric AVMsin 2019, portal hypertensive gastropathy and grade 1 esophageal varices in 2021), type 1 isolated gastric varices,GERD/Barrett's esophagus, CAD s/p CABG, HFpEF (EF 55-60%as of12/2020)presenting for consultation of symptomatic anemia.  Patient presented to the ED yesterday with symptomatic anemia.  He reports recent weakness, shortness of breath with exertion, and fatigue.  He denies melena or hematochezia to me, but per hospitalist note, he reported "black-colored stools without blood."  He denies any abdominal pain, nausea, vomiting, hematemesis, changes in appetite, or unexplained weight loss.  Denies ASA, NSAID, or blood thinner use.  Last EGD 11/20/20: Minimal spontaneious oozing from body of stomach, possibly localized portal gastropathy or gastritis, without blood in stomach at start of procedure, therefore this finding is of uncertain significance. Grade I esophageal varices. Hemorrhagic gastropathy as described above. Type 1 isolated gastric varices (IGV1, varices located in the fundus), without bleeding.  Normal examined duodenum.  Last colonoscopy 11/20/20: No definite source of patient's recurrent anemia seen on this study. Two diminutive polyps in the transverse colon, removed with a cold snare. Resected and retrieved. One 4 mm polyp in the distal rectum, removed with a cold snare. Resected and retrieved. Erythematous mucosa in the proximal ascending colon.   Past Medical History:  Diagnosis Date  . Arthritis    "joints tighten up sometimes" (03/27/2104)  . Carcinoma of liver, hepatocellular ([place]) 06/30/2020  . CHF (congestive heart failure) ([place])   . Chronic lower back pain   . Coronary  artery disease    a. 05/2012 Cath/PCI: LM 40ost, 50-60d, LAD 99p ruptured plaque (3.0x28 DES), LCX 50p/m, RCA 30-40p, 69m EF 65-70%.  . Coronary artery disease involving native coronary artery of native heart without angina pectoris    Severe left main disease at catheterization July 2015  CABG x3 with a LIMA to the LAD, SVG to the OM, SVG to the PDA on 03/31/14. EF 60% by cath.   . Diastolic heart failure ([place]   . GERD without esophagitis 08/04/2010  . Hepatic cirrhosis ([place]    a. Dx 01/2014 - CT a/p   . History of blood transfusion    "related to bleeding ulcers"  . History of concussion    1976--  NO RESIDUAL  . History of GI bleed    a. UGIB 07/2012;  b. 01/2014 admission with GIB/FOB stool req 1U prbc's->EGD showed portal gastropathy, barrett's esoph, and chronic active h. pylori gastritis.  .Marland KitchenHistory of gout    2007 &  2008  LEFT LEG-- NO ISSUE SINCE  . Hyperlipidemia   . Iron deficiency anemia   . Kidney stones   . OA (osteoarthritis of spine)    LOWER BACK--  INTERMITTANT LEFT LEG NUMBNESS  . OSA (obstructive sleep apnea)    PULMOLOGIST-  DR CLANCE--  MODERATE OSA  STARTED CPAP 2012--  BUT CURRENTLY HAS NOT USED PAST 6 MONTHS  . Phimosis    a. s/p circumcision 2015.  . Type 2 diabetes mellitus ([place]   . Unspecified essential hypertension     Past Surgical History:  Procedure Laterality Date  . ANKLE FRACTURE SURGERY Right 1989   "plate put in"  . APPENDECTOMY  05-16-2004   open  . CIRCUMCISION N/A 09/09/2013   Procedure: CIRCUMCISION ADULT;  Surgeon: Bernestine Amass, MD;  Location: [facility];  Service: Urology;  Laterality: N/A;  . COLECTOMY  05-16-2004  . COLONOSCOPY WITH PROPOFOL N/A 11/20/2020   Procedure: COLONOSCOPY WITH PROPOFOL;  Surgeon: Ronald Lobo, MD;  Location: [facility] ENDOSCOPY;  Service: Endoscopy;  Laterality: N/A;  . CORONARY ANGIOPLASTY WITH STENT PLACEMENT  06/28/2012  DR COOPER   PCI W/  X1 DES to [place]. LAD/  LM  40% OSTIAL & 50-60%  DISTAL /  50% PROX LCX/  30-40% PROX RCA & 50% MID RCA/   LVEF 65-70%  . CORONARY ARTERY BYPASS GRAFT N/A 03/31/2014   Procedure: CORONARY ARTERY BYPASS GRAFTING (CABG) times 3 using left internal mammary artery and right saphenous vein.;  Surgeon: Melrose Nakayama, MD;  Location: [place];  Service: Open Heart Surgery;  Laterality: N/A;  . DOBUTAMINE STRESS ECHO  06-08-2012   MODERATE HYPOKINESIS/ ISCHEMIA MID INFERIOR WALL  . ESOPHAGOGASTRODUODENOSCOPY  08/15/2012   Procedure: ESOPHAGOGASTRODUODENOSCOPY (EGD);  Surgeon: Wonda Horner, MD;  Location: [facility] ENDOSCOPY;  Service: Endoscopy;  Laterality: N/A;  . ESOPHAGOGASTRODUODENOSCOPY N/A 02/17/2014   Procedure: ESOPHAGOGASTRODUODENOSCOPY (EGD);  Surgeon: Jeryl Columbia, MD;  Location[facility] ENDOSCOPY;  Service: Endoscopy;  Laterality: N/A;  . ESOPHAGOGASTRODUODENOSCOPY N/A 04/17/2020   Procedure: ESOPHAGOGASTRODUODENOSCOPY (EGD);  Surgeon: Ronnette Juniper, MD;  Location: [place];  Service: Gastroenterology;  Laterality: N/A;  . ESOPHAGOGASTRODUODENOSCOPY N/A 09/11/2020   Procedure: ESOPHAGOGASTRODUODENOSCOPY (EGD);  Surgeon: Clarene Essex, MD;  Location: [facility];  Service: Endoscopy;  Laterality: N/A;  . ESOPHAGOGASTRODUODENOSCOPY  10/09/2020  . ESOPHAGOGASTRODUODENOSCOPY N/A 10/09/2020   Procedure: ESOPHAGOGASTRODUODENOSCOPY (EGD);  Surgeon: Otis Brace, MD;  Location: [facility] ENDOSCOPY;  Service: Gastroenterology;  Laterality: N/A;  . ESOPHAGOGASTRODUODENOSCOPY (EGD) WITH PROPOFOL N/A 09/30/2017   Procedure: ESOPHAGOGASTRODUODENOSCOPY (EGD) WITH PROPOFOL;  Surgeon: Wonda Horner, MD;  Location: [facility] ENDOSCOPY;  Service: Endoscopy;  Laterality: N/A;  . ESOPHAGOGASTRODUODENOSCOPY (EGD) WITH PROPOFOL N/A 12/20/2017   Procedure: ESOPHAGOGASTRODUODENOSCOPY (EGD) WITH PROPOFOL;  Surgeon: Clarene Essex, MD;  Location: [place];  Service: Endoscopy;  Laterality: N/A;  . ESOPHAGOGASTRODUODENOSCOPY (EGD) WITH PROPOFOL N/A 08/10/2020   Procedure:  ESOPHAGOGASTRODUODENOSCOPY (EGD) WITH PROPOFOL;  Surgeon: Wilford Corner, MD;  Location: [place];  Service: Endoscopy;  Laterality: N/A;  . ESOPHAGOGASTRODUODENOSCOPY (EGD) WITH PROPOFOL N/A 11/20/2020   Procedure: ESOPHAGOGASTRODUODENOSCOPY (EGD) WITH PROPOFOL;  Surgeon: Ronald Lobo, MD;  [facility];  Service: Endoscopy;  Laterality: N/A;  . GIVENS CAPSULE STUDY N/A 11/20/2020   Procedure: GIVENS CAPSULE STUDY;  Surgeon: Ronald Lobo, MD;  [facility];  Service: Endoscopy;  Laterality: N/A;  . HERNIA REPAIR    . INTRAOPERATIVE TRANSESOPHAGEAL ECHOCARDIOGRAM N/A 03/31/2014   Procedure: INTRAOPERATIVE TRANSESOPHAGEAL ECHOCARDIOGRAM;  Surgeon: Melrose Nakayama, MD;  Location: [employer];  Service: Open Heart Surgery;  Laterality: N/A;  . IR ANGIOGRAM SELECTIVE EACH ADDITIONAL VESSEL  06/16/2020  . IR ANGIOGRAM SELECTIVE EACH ADDITIONAL VESSEL  06/16/2020  . IR ANGIOGRAM SELECTIVE EACH ADDITIONAL VESSEL  06/16/2020  . IR ANGIOGRAM SELECTIVE EACH ADDITIONAL VESSEL  06/16/2020  . IR ANGIOGRAM SELECTIVE EACH ADDITIONAL VESSEL  06/16/2020  . IR ANGIOGRAM SELECTIVE EACH ADDITIONAL VESSEL  06/16/2020  . IR ANGIOGRAM SELECTIVE EACH ADDITIONAL VESSEL  06/16/2020  . IR ANGIOGRAM SELECTIVE EACH ADDITIONAL VESSEL  06/16/2020  . IR ANGIOGRAM SELECTIVE EACH ADDITIONAL VESSEL  06/16/2020  . IR ANGIOGRAM SELECTIVE EACH ADDITIONAL VESSEL  06/30/2020  . IR ANGIOGRAM SELECTIVE EACH ADDITIONAL VESSEL  06/30/2020  . IR ANGIOGRAM VISCERAL SELECTIVE  06/16/2020  . IR ANGIOGRAM VISCERAL  SELECTIVE  06/30/2020  . IR EMBO ARTERIAL NOT HEMORR HEMANG INC GUIDE ROADMAPPING  06/16/2020  . IR EMBO TUMOR ORGAN ISCHEMIA INFARCT INC GUIDE ROADMAPPING  06/30/2020  . IR RADIOLOGIST EVAL & MGMT  05/06/2020  . IR RADIOLOGIST EVAL & MGMT  05/20/2020  . IR US GUIDE VASC ACCESS RIGHT  06/16/2020  . IR US GUIDE VASC ACCESS RIGHT  06/30/2020  . LAPAROSCOPIC UMBILICAL HERNIA REPAIR W/ MESH  06-06-2011  . LEFT  HEART CATHETERIZATION WITH CORONARY ANGIOGRAM N/A 03/28/2014   Procedure: LEFT HEART CATHETERIZATION WITH CORONARY ANGIOGRAM;  Surgeon: Sinclair Grooms, MD;  Location: [facility] CATH LAB;  Service: Cardiovascular;  Laterality: N/A;  . LIPOMA EXCISION Left 08/25/2017   Procedure: EXCISION LIPOMA LEFT POSTERIOR THIGH;  Surgeon: Clovis Riley, MD;  Location: [place];  Service: General;  Laterality: Left;  . NEPHROLITHOTOMY  1990'S  . OPEN APPENDECTOMY W/ PARTIAL CECECTOMY  05-16-2004  . PERCUTANEOUS CORONARY STENT INTERVENTION (PCI-S) N/A 06/28/2012   Procedure: PERCUTANEOUS CORONARY STENT INTERVENTION (PCI-S);  Surgeon: Sherren Mocha, MD;  Location: [facility] CATH LAB;  Service: Cardiovascular;  Laterality: N/A;  . POLYPECTOMY  11/20/2020   Procedure: POLYPECTOMY;  Surgeon: Ronald Lobo, MD;  [facility] END[facility];  Service: Endoscopy;;    Prior to Admission medications   Medication Sig Start Date End Date Taking? Authorizing Provider  albuterol (VENTOLIN HFA) 108 (90 Base) MCG/ACT inhaler Inhale 2 puffs into the lungs every 6 (six) hours as needed for wheezing or shortness of breath. 03/12/20  Yes [name], MD  atorvastatin (LIPITOR) 40 MG tablet TAKE 1 TABLET BY MOUTH DAILY AT 6 PM Patient taking differently: Take 40 mg by mouth daily at 6 PM. 01/04/21  Yes [name], MD  gabapentin (NEURONTIN) 100 MG capsule TAKE 1 CAPSULE(100 MG) BY MOUTH TWICE DAILY Patient taking differently: Take 100 mg by mouth 2 (two) times daily. 08/03/20  Yes [name], MD  glimepiride (AMARYL) 2 MG tablet TAKE 1 TABLET(2 MG) BY MOUTH DAILY BEFORE BREAKFAST Patient taking differently: Take 2 mg by mouth daily with breakfast. 12/16/20  Yes [name], MD  hydrocortisone (ANUSOL-HC) 2.5 % rectal cream Place 1 application rectally 2 (two) times daily. Patient taking differently: Place 1 application rectally 2 (two) times daily as needed for hemorrhoids. 12/04/20  Yes [name], MD  metFORMIN (GLUCOPHAGE) 850 MG tablet TAKE 1 TABLET(850 MG) BY MOUTH TWICE DAILY WITH A MEAL Patient taking differently: Take 850 mg by mouth in the morning and at bedtime. 06/16/20  Yes [name], MD  nitroGLYCERIN (NITROSTAT) 0.4 MG SL tablet Place 1 tablet (0.4 mg total) under the tongue every 5 (five) minutes as needed for chest pain. 06/08/16  Yes Nahser, Wonda Cheng, MD  polyethylene glycol powder (GLYCOLAX/MIRALAX) 17 GM/SCOOP powder Take 8.5 g by mouth in the morning, at noon, and at bedtime.   Yes [provider]  spironolactone (ALDACTONE) 25 MG tablet Take 1 tablet (25 mg total) by mouth daily. 08/18/20  Yes Danford, Suann Larry, MD  sucralfate (CARAFATE) 1 g tablet Take 1 tablet (1 g total) by mouth 4 (four) times daily. Patient taking differently: Take 2 g by mouth in the morning and at bedtime. 11/23/20 12/23/20 Yes Elodia Florence., MD  torsemide (DEMADEX) 20 MG tablet Take 2 tablets (40 mg total) by mouth 2 (two) times daily. 08/18/20  Yes Danford, Suann Larry, MD  umeclidinium-vilanterol (ANORO ELLIPTA) 62.5-25 MCG/INH AEPB Inhale 1 puff into the lungs  daily. 03/12/20  Yes [name], MD  Accu-Chek Softclix Lancets lancets USE AS DIRECTED TO TEST BLOOD SUGAR FOUR TIMES DAILY 03/13/20   [name], MD  blood glucose meter kit and supplies Dispense based on patient and insurance preference. Use up to four times daily as directed. (FOR ICD-10 E10.9, E11.9). 03/12/20   [name], MD  pantoprazole (PROTONIX) 40 MG tablet Take 1 tablet (40 mg total) by mouth 2 (two) times daily. Patient not taking: No sig reported 09/12/20   Pokhrel, Corrie Mckusick, MD  potassium chloride (KLOR-CON) 20 MEQ tablet Take 1 tablet (20 mEq total) by mouth daily. Patient not taking: No sig reported 08/18/20   Edwin Dada, MD  Psyllium (METAMUCIL PO) Take 15 mLs by mouth 3 (three) times daily with meals. Mix with 4 oz of juice or water    [provider]    Scheduled Meds: . sodium chloride   Intravenous Once  . atorvastatin  40 mg Oral QHS  . furosemide  20 mg Intravenous Once  . pantoprazole (PROTONIX) IV  40 mg Intravenous Q12H  . sodium chloride flush  3 mL Intravenous Q12H  . umeclidinium-vilanterol  1 puff Inhalation Daily   Continuous Infusions: . sodium chloride Stopped (01/06/21 1728)  . dextrose 30 mL/hr at 01/07/21 0520   PRN Meds:.acetaminophen **OR** acetaminophen, albuterol, ondansetron **OR** ondansetron (ZOFRAN) IV  Allergies as of 01/06/2021 - Review Complete 01/06/2021  Allergen Reaction Noted  . Other  11/18/2020    Family History  Problem Relation Age of Onset  . Lung cancer Sister   . Cancer Sister        lung  . Cancer Mother   . Cancer Father        died in his 1s.  . Cancer Brother        lung  . Coronary artery disease Other   . Diabetes Other   . Colon cancer Other   . Cancer Sister        lung    Social History   Socioeconomic History  . Marital status: Married    Spouse name: Not on file  . Number of children: Not on file  . Years of education: Not on file  . Highest education level: Not on file  Occupational History  . Not on file  Tobacco Use  . Smoking status: Former Smoker    Packs/day: 2.00    Years: 40.00    Pack years: 80.00    Types: Cigarettes    Quit date: 08/29/1996    Years since quitting: 24.3  . Smokeless tobacco: Never Used  Vaping Use  . Vaping Use: Never used  Substance and Sexual Activity  . Alcohol use: Yes    Alcohol/week: 8.0 standard drinks    Types: 8 Cans of beer per week    Comment: 2 beers every other day  . Drug use: No  . Sexual activity: Yes  Other Topics Concern  . Not on file  Social History Narrative   Lives in [place] with wife, son, and son's family.   Social Determinants of Health   Financial Resource Strain: Medium Risk  . Difficulty of Paying Living Expenses: Somewhat hard  Food Insecurity: No Food Insecurity  .  Worried About Charity fundraiser in the Last Year: Never true  . Ran Out of Food in the Last Year: Never true  Transportation Needs: No Transportation Needs  . Lack of Transportation (Medical): No  . Lack  of Transportation (Non-Medical): No  Physical Activity: Not on file  Stress: Not on file  Social Connections: Not on file  Intimate Partner Violence: Not on file    Review of Systems: Review of Systems  Constitutional: Positive for malaise/fatigue. Negative for chills, fever and weight loss.  HENT: Negative for hearing loss and tinnitus.   Eyes: Negative for pain and redness.  Respiratory: Positive for shortness of breath. Negative for cough.   Cardiovascular: Negative for chest pain and palpitations.  Gastrointestinal: Negative for abdominal pain, blood in stool, constipation, diarrhea, heartburn, melena, nausea and vomiting.  Genitourinary: Negative for flank pain and hematuria.  Musculoskeletal: Negative for falls and joint pain.  Skin: Negative for itching and rash.  Neurological: Negative for seizures and loss of consciousness.  Endo/Heme/Allergies: Negative for polydipsia. Does not bruise/bleed easily.  Psychiatric/Behavioral: Negative for substance abuse. The patient is not nervous/anxious.     Physical Exam: Vital signs: Vitals:   01/07/21 0504 01/07/21 0929  BP: 113/61 (!) 110/56  Pulse: 66 64  Resp: 16 18  Temp: 98 F (36.7 C) 97.8 F (36.6 C)  SpO2: 96% 100%   Last BM Date: 01/06/21  Physical Exam Vitals reviewed.  Constitutional:      General: He is not in acute distress. HENT:     Head: Normocephalic and atraumatic.     Nose: Nose normal. No congestion.  Eyes:     General: No scleral icterus.    Extraocular Movements: Extraocular movements intact.     Comments: Conjunctival pallor  Cardiovascular:     Rate and Rhythm: Normal rate and regular rhythm.  Pulmonary:     Effort: Pulmonary effort is normal. No respiratory distress.  Abdominal:      General: There is distension.     Palpations: Abdomen is soft. There is no mass.     Tenderness: There is no abdominal tenderness. There is no guarding or rebound.     Hernia: No hernia is present.  Musculoskeletal:     Cervical back: Normal range of motion and neck supple.     Right lower leg: Edema present.     Left lower leg: Edema present.  Skin:    General: Skin is warm and dry.  Neurological:     General: No focal deficit present.     Mental Status: He is alert and oriented to person, place, and time.  Psychiatric:        Mood and Affect: Mood normal.        Behavior: Behavior normal. Behavior is cooperative.      GI:  Lab Results: Recent Labs    01/06/21 1630 01/07/21 0725  WBC 3.4* 3.2*  HGB 4.8* 6.2*  HCT 18.5* 22.0*  PLT 133* 131*   BMET Recent Labs    01/06/21 1630 01/07/21 0725  NA 136 135  K 3.6 3.5  CL 104 104  CO2 24 24  GLUCOSE 73 70  BUN 34* 32*  CREATININE 1.33* 1.20  CALCIUM 8.5* 8.6*   LFT Recent Labs    01/06/21 1630  PROT 7.1  ALBUMIN 3.7  AST 19  ALT 15  ALKPHOS 96  BILITOT 0.7   PT/INR No results for input(s): LABPROT, INR in the last 72 hours.   Studies/Results: DG Chest Port 1 View  Result Date: 01/06/2021 CLINICAL DATA:  Dyspnea.  Shortness of breath. EXAM: PORTABLE CHEST 1 VIEW COMPARISON:  11/20/2020 FINDINGS: Post median sternotomy and CABG. The heart is enlarged. Stable mediastinal contours. Interstitial edema which  appears similar to prior exam. Probable right pleural effusion. No confluent consolidation or pneumothorax. Stable osseous structures. IMPRESSION: Cardiomegaly with interstitial edema, similar to prior exam. Probable right pleural effusion. Electronically Signed   By: Keith Rake M.D.   On: 01/06/2021 16:50    Impression: Suspected UpperGI bleeding:history of portal hypertensive gastropathy, Grade 1 esophageal varices, and isolated gastric varix per EGD 07/2020. History of gastric AVMs, which is most  likely source of bleeding, given current stability without overt bleeding with the exception of one episode of melena reported to hospitalist.  -Hgb 4.8 on arrival, decreased from 7.7 on 12/04/20 -INR ordered, pending  Hepatocellular carcinoma s/p radiation   Hepatic cirrhosis, presumably alcohol-related -Normal LFTs as of 01/06/21. T. Bili 0.7/ AST 19/ ALT 15/ ALP 96 -INR pending -Platelets131 K/uL  Plan: EGD tomorrow. I thoroughly discussed the procedure with the patient to include nature, alternatives, benefits, and risks (including but not limited to bleeding, infection, perforation, anesthesia/cardiac and pulmonary complications). Patient verbalized understanding gave verbal consent to proceed with EGD.   Clear liquid diet, NPO after midnight.  StartOctreotide. Continue Protonix.  Ceftriaxone ordered, given GI bleeding in the setting of cirrhosis.  Continue to monitor CBC with transfusion as needed to maintain hemoglobin>7.   LOS: 1 day   Salley Slaughter  PA-C 01/07/2021, 9:51 AM  Contact #  [phone]

## 2021-01-07 NOTE — Progress Notes (Signed)
Received a call from bedside RN regarding critical value Hg 6.8K in the setting of known cirrhosis.  Will transfuse 1U PRBC and repeat CBC post transfusion as per protocol.

## 2021-01-07 NOTE — Progress Notes (Signed)
PROGRESS NOTE    [name]  JJO:[id number] DOB: Jun 08, 1946 DOA: 01/06/2021 PCP: [name], MD   Brief Narrative:  75 year old with history of hepatic cirrhosis, portal hypertension/esophageal varices, recurrent anemia, HCC status posttreatment 06/2020, CAD status post CABG, diastolic CHF EF 16%, CKD stage IIIa, asthma, DM2, HTN, HLD presents with shortness of breath and dizziness with complains of dark stools.  Patient was found to have symptomatic anemia with hemoglobin of 4.8.   Assessment & Plan:   Principal Problem:   Symptomatic anemia Active Problems:   Type 2 diabetes with complication (HCC)   Coronary artery disease involving native coronary artery of native heart without angina pectoris   Hyperlipidemia with target LDL less than 70   Hepatic cirrhosis (HCC)   CKD (chronic kidney disease), stage III (HCC)   (HFpEF) heart failure with preserved ejection fraction (HCC)    Recurrent symptomatic anemia, recurrent GI bleeding Hepatic cirrhosis with portal hypertension and esophageal varices: History of hepatocellular carcinoma status posttreatment 06/2020 -Admission hemoglobin 4.8, status post 2 units PRBC transfusion.  Baseline hemoglobin 7.5 - Morning hemoglobin- 6.2.  Will order 1 more unit PRBC - N.p.o. - PPI IV twice daily -[facility] consult, plans for scope tomorrow.  - Endoscopy/colonoscopy and capsule endoscopy in March 2022- unrevealing for any definitive causes  Type 2 diabetes: With recurrent hypoglycemic episode Peripheral neuropathy -Last A1c 5.3% 08/10/2020.    Patient took Amaryl and metformin yesterday.  Due to recurrent hypoglycemic episode on D10 30 cc an hour.  Continue Accu-Cheks -Resume gabapentin when tolerating orals  Chronic HFpEF: EF 01%, grade 3 diastolic dysfunction -Received IV Lasix in between his 2 unit transfusion yesterday.  1 more Lasix ordered after another unit transfusion today. - Resume oral torsemide when  appropriate  CKD stage IIIa: -Creatinine stable around 1.33.  Baseline around 1.2  CAD s/p CABG: -Currently patient denies any chest pain  Hypertension: -Home medications on hold-Aldactone  Asthma: -Continue Breo and albuterol as needed  Hyperlipidemia: -Lipitor   DVT prophylaxis: SCDs Code Status: Full code Family Communication:  Karel Jarvis, left a voicemail   Status is: Inpatient  Remains inpatient appropriate because:Inpatient level of care appropriate due to severity of illness   Dispo: The patient is from: Home              Anticipated d/c is to: Home              Patient currently is not medically stable to d/c. Need C scope for bleeding. On IVF D5 for persistent hypoglycemia.    Difficult to place patient No         Subjective: Patient states his stools are now brown to 70 complaints. Overnight he had episodes of hypoglycemia requiring D10 infusion.  Review of Systems Otherwise negative except as per HPI, including: General: Denies fever, chills, night sweats or unintended weight loss. Resp: Denies cough, wheezing, shortness of breath. Cardiac: Denies chest pain, palpitations, orthopnea, paroxysmal nocturnal dyspnea. GI: Denies abdominal pain, nausea, vomiting, diarrhea or constipation GU: Denies dysuria, frequency, hesitancy or incontinence MS: Denies muscle aches, joint pain or swelling Neuro: Denies headache, neurologic deficits (focal weakness, numbness, tingling), abnormal gait Psych: Denies anxiety, depression, SI/HI/AVH Skin: Denies new rashes or lesions ID: Denies sick contacts, exotic exposures, travel  Examination:  General exam: Appears calm and comfortable  Respiratory system: Clear to auscultation. Respiratory effort normal. Cardiovascular system: S1 & S2 heard, RRR. No JVD, murmurs, rubs, gallops or clicks. No pedal edema. Gastrointestinal system:  Abdomen is nondistended, soft and nontender. No organomegaly or masses felt. Normal  bowel sounds heard. Central nervous system: Alert and oriented. No focal neurological deficits. Extremities: Symmetric 5 x 5 power. Skin: No rashes, lesions or ulcers Psychiatry: Judgement and insight appear normal. Mood & affect appropriate.     Objective: Vitals:   01/07/21 0201 01/07/21 0215 01/07/21 0248 01/07/21 0504  BP: 123/63 130/60 128/77 113/61  Pulse: 64 65 70 66  Resp: 20 19 17 16   Temp: 98.2 F (36.8 C) 97.9 F (36.6 C) 97.9 F (36.6 C) 98 F (36.7 C)  TempSrc: Oral Oral Oral Oral  SpO2: 99% 96% 94% 96%  Weight:      Height:        Intake/Output Summary (Last 24 hours) at 01/07/2021 0806 Last data filed at 01/07/2021 0708 Gross per 24 hour  Intake 371.61 ml  Output 1600 ml  Net -1228.39 ml   Filed Weights   01/06/21 1543  Weight: 102 kg     Data Reviewed:   CBC: Recent Labs  Lab 01/06/21 1630 01/07/21 0725  WBC 3.4* 3.2*  HGB 4.8* 6.2*  HCT 18.5* 22.0*  MCV 77.4* 77.5*  PLT 133* 841*   Basic Metabolic Panel: Recent Labs  Lab 01/06/21 1630  NA 136  K 3.6  CL 104  CO2 24  GLUCOSE 73  BUN 34*  CREATININE 1.33*  CALCIUM 8.5*   GFR: Estimated Creatinine Clearance: 56.4 mL/min (A) (by C-G formula based on SCr of 1.33 mg/dL (H)). Liver Function Tests: Recent Labs  Lab 01/06/21 1630  AST 19  ALT 15  ALKPHOS 96  BILITOT 0.7  PROT 7.1  ALBUMIN 3.7   No results for input(s): LIPASE, AMYLASE in the last 168 hours. No results for input(s): AMMONIA in the last 168 hours. Coagulation Profile: No results for input(s): INR, PROTIME in the last 168 hours. Cardiac Enzymes: No results for input(s): CKTOTAL, CKMB, CKMBINDEX, TROPONINI in the last 168 hours. BNP (last 3 results) No results for input(s): PROBNP in the last 8760 hours. HbA1C: No results for input(s): HGBA1C in the last 72 hours. CBG: Recent Labs  Lab 01/07/21 0441 01/07/21 0530 01/07/21 0722  GLUCAP 50* 130* 85   Lipid Profile: No results for input(s): CHOL, HDL,  LDLCALC, TRIG, CHOLHDL, LDLDIRECT in the last 72 hours. Thyroid Function Tests: No results for input(s): TSH, T4TOTAL, FREET4, T3FREE, THYROIDAB in the last 72 hours. Anemia Panel: No results for input(s): VITAMINB12, FOLATE, FERRITIN, TIBC, IRON, RETICCTPCT in the last 72 hours. Sepsis Labs: No results for input(s): PROCALCITON, LATICACIDVEN in the last 168 hours.  Recent Results (from the past 240 hour(s))  Resp Panel by RT-PCR (Flu A&B, Covid) Nasopharyngeal Swab     Status: None   Collection Time: 01/06/21  4:25 PM   Specimen: Nasopharyngeal Swab; Nasopharyngeal(NP) swabs in vial transport medium  Result Value Ref Range Status   SARS Coronavirus 2 by RT PCR NEGATIVE NEGATIVE Final    Comment: (NOTE) SARS-CoV-2 target nucleic acids are NOT DETECTED.  The SARS-CoV-2 RNA is generally detectable in upper respiratory specimens during the acute phase of infection. The lowest concentration of SARS-CoV-2 viral copies this assay can detect is 138 copies/mL. A negative result does not preclude SARS-Cov-2 infection and should not be used as the sole basis for treatment or other patient management decisions. A negative result may occur with  improper specimen collection/handling, submission of specimen other than nasopharyngeal swab, presence of viral mutation(s) within the areas targeted by this  assay, and inadequate number of viral copies(<138 copies/mL). A negative result must be combined with clinical observations, patient history, and epidemiological information. The expected result is Negative.  Fact Sheet for Patients:  EntrepreneurPulse.com.au  Fact Sheet for Healthcare Providers:  IncredibleEmployment.be  This test is no t yet approved or cleared by the [place] FDA and  has been authorized for detection and/or diagnosis of SARS-CoV-2 by FDA under an Emergency Use Authorization (EUA). This EUA will remain  in effect (meaning this test  can be used) for the duration of the COVID-19 declaration under Section 564(b)(1) of the Act, 21 U.S.C.section 360bbb-3(b)(1), unless the authorization is terminated  or revoked sooner.       Influenza A by PCR NEGATIVE NEGATIVE Final   Influenza B by PCR NEGATIVE NEGATIVE Final    Comment: (NOTE) The Xpert Xpress SARS-CoV-2/FLU/RSV plus assay is intended as an aid in the diagnosis of influenza from Nasopharyngeal swab specimens and should not be used as a sole basis for treatment. Nasal washings and aspirates are unacceptable for Xpert Xpress SARS-CoV-2/FLU/RSV testing.  Fact Sheet for Patients: EntrepreneurPulse.com.au  Fact Sheet for Healthcare Providers: IncredibleEmployment.be  This test is not yet approved or cleared by the [place] FDA and has been authorized for detection and/or diagnosis of SARS-CoV-2 by FDA under an Emergency Use Authorization (EUA). This EUA will remain in effect (meaning this test can be used) for the duration of the COVID-19 declaration under Section 564(b)(1) of the Act, 21 U.S.C. section 360bbb-3(b)(1), unless the authorization is terminated or revoked.  Performed at [facility], [place] [address] [place], [place] [address]          Radiology Studies: [facility] Chest Port 1 View  Result Date: 01/06/2021 CLINICAL DATA:  Dyspnea.  Shortness of breath. EXAM: PORTABLE CHEST 1 VIEW COMPARISON:  11/20/2020 FINDINGS: Post median sternotomy and CABG. The heart is enlarged. Stable mediastinal contours. Interstitial edema which appears similar to prior exam. Probable right pleural effusion. No confluent consolidation or pneumothorax. Stable osseous structures. IMPRESSION: Cardiomegaly with interstitial edema, similar to prior exam. Probable right pleural effusion. Electronically Signed   By: Keith Rake M.D.   On: 01/06/2021 16:50        Scheduled Meds: . atorvastatin  40 mg Oral QHS  .  pantoprazole (PROTONIX) IV  40 mg Intravenous Q12H  . sodium chloride flush  3 mL Intravenous Q12H  . umeclidinium-vilanterol  1 puff Inhalation Daily   Continuous Infusions: . sodium chloride Stopped (01/06/21 1728)  . dextrose 30 mL/hr at 01/07/21 0520     LOS: 1 day   Time spent= 35 mins    Maclain Cohron Arsenio Loader, MD [facility]  If 7PM-7AM, please contact night-coverage  01/07/2021, 8:06 AM

## 2021-01-07 NOTE — Consult Note (Signed)
   [facility] CM Inpatient Consult   01/07/2021  [name] [birth year] [id number]   Patient chart reviewed for potential [facility] CM) services due to high unplanned readmission risk score and 5 hospitalizations within past 6 months. Per review, patient has been engaged in ambulatory chronic case management services through [facility] CM embedded program.  Plan: Continue to follow for progression and disposition and update RN case manager at [facility] of patient admission.  Of note, [facility] Care Management services does not replace or interfere with any services that are arranged by inpatient case management or social work.  Netta Cedars, MSN, [facility] Liaison Nurse Mobile Phone [phone]  Toll free office [phone]

## 2021-01-07 NOTE — Plan of Care (Signed)
  Problem: Education: Goal: Knowledge of General Education information will improve Description: Including pain rating scale, medication(s)/side effects and non-pharmacologic comfort measures Outcome: Progressing   Problem: Clinical Measurements: Goal: Respiratory complications will improve Outcome: Progressing   Problem: Activity: Goal: Risk for activity intolerance will decrease Outcome: Progressing   

## 2021-01-08 ENCOUNTER — Other Ambulatory Visit: Payer: Self-pay | Admitting: Internal Medicine

## 2021-01-08 ENCOUNTER — Inpatient Hospital Stay (HOSPITAL_COMMUNITY): Payer: Medicare HMO

## 2021-01-08 ENCOUNTER — Inpatient Hospital Stay (HOSPITAL_COMMUNITY): Payer: Medicare HMO | Admitting: Certified Registered"

## 2021-01-08 ENCOUNTER — Encounter (HOSPITAL_COMMUNITY)
Admission: EM | Disposition: A | Discharge status: Home / Self Care | Payer: Self-pay | Source: Home / Self Care | Attending: Internal Medicine

## 2021-01-08 ENCOUNTER — Encounter (HOSPITAL_COMMUNITY): Payer: Self-pay | Admitting: Internal Medicine

## 2021-01-08 HISTORY — PX: BIOPSY: SHX5522

## 2021-01-08 HISTORY — PX: ESOPHAGOGASTRODUODENOSCOPY: SHX5428

## 2021-01-08 LAB — CBC
HCT: 25.7 % — ABNORMAL LOW (ref 39.0–52.0)
Hemoglobin: 7.6 g/dL — ABNORMAL LOW (ref 13.0–17.0)
MCH: 23.3 pg — ABNORMAL LOW (ref 26.0–34.0)
MCHC: 29.6 g/dL — ABNORMAL LOW (ref 30.0–36.0)
MCV: 78.8 fL — ABNORMAL LOW (ref 80.0–100.0)
Platelets: 136 10*3/uL — ABNORMAL LOW (ref 150–400)
RBC: 3.26 MIL/uL — ABNORMAL LOW (ref 4.22–5.81)
RDW: 19.9 % — ABNORMAL HIGH (ref 11.5–15.5)
WBC: 3.5 10*3/uL — ABNORMAL LOW (ref 4.0–10.5)
nRBC: 0 % (ref 0.0–0.2)

## 2021-01-08 LAB — BASIC METABOLIC PANEL
Anion gap: 6 (ref 5–15)
BUN: 25 mg/dL — ABNORMAL HIGH (ref 8–23)
CO2: 25 mmol/L (ref 22–32)
Calcium: 8.4 mg/dL — ABNORMAL LOW (ref 8.9–10.3)
Chloride: 104 mmol/L (ref 98–111)
Creatinine, Ser: 1.21 mg/dL (ref 0.61–1.24)
GFR, Estimated: >60 mL/min (ref >60)
Glucose, Bld: 115 mg/dL — ABNORMAL HIGH (ref 70–99)
Potassium: 4.1 mmol/L (ref 3.5–5.1)
Sodium: 135 mmol/L (ref 135–145)

## 2021-01-08 LAB — GLUCOSE, CAPILLARY
Glucose-Capillary: 113 mg/dL — ABNORMAL HIGH (ref 70–99)
Glucose-Capillary: 119 mg/dL — ABNORMAL HIGH (ref 70–99)
Glucose-Capillary: 151 mg/dL — ABNORMAL HIGH (ref 70–99)
Glucose-Capillary: 176 mg/dL — ABNORMAL HIGH (ref 70–99)
Glucose-Capillary: 87 mg/dL (ref 70–99)
Glucose-Capillary: 89 mg/dL (ref 70–99)

## 2021-01-08 SURGERY — EGD (ESOPHAGOGASTRODUODENOSCOPY)
Anesthesia: Monitor Anesthesia Care

## 2021-01-08 MED ORDER — EPHEDRINE SULFATE-NACL 50-0.9 MG/10ML-% IV SOSY
PREFILLED_SYRINGE | INTRAVENOUS | Status: DC | PRN
  Administered 2021-01-08: 10 mg via INTRAVENOUS

## 2021-01-08 MED ORDER — LACTATED RINGERS IV SOLN: INTRAVENOUS | Status: DC

## 2021-01-08 MED ORDER — DEXTROSE-NACL 5-0.45 % IV SOLN: INTRAVENOUS | Status: AC

## 2021-01-08 MED ORDER — SODIUM CHLORIDE (PF) 0.9 % IJ SOLN
INTRAMUSCULAR | Status: AC
  Filled 2021-01-08: qty 50

## 2021-01-08 MED ORDER — PROPOFOL 500 MG/50ML IV EMUL
INTRAVENOUS | Status: AC
  Filled 2021-01-08: qty 50

## 2021-01-08 MED ORDER — PROPOFOL 10 MG/ML IV BOLUS
INTRAVENOUS | Status: AC
  Filled 2021-01-08: qty 20

## 2021-01-08 MED ORDER — PROPOFOL 10 MG/ML IV BOLUS
INTRAVENOUS | Status: DC | PRN
  Administered 2021-01-08: 20 mg via INTRAVENOUS

## 2021-01-08 MED ORDER — IOHEXOL 350 MG/ML SOLN
100.0000 mL | Freq: Once | INTRAVENOUS | Status: AC | PRN
  Administered 2021-01-08: 100 mL via INTRAVENOUS

## 2021-01-08 MED ORDER — LACTATED RINGERS IV SOLN
INTRAVENOUS | Status: AC | PRN
  Administered 2021-01-08: 1000 mL via INTRAVENOUS

## 2021-01-08 MED ORDER — PROPOFOL 500 MG/50ML IV EMUL
INTRAVENOUS | Status: DC | PRN
  Administered 2021-01-08: 115 ug/kg/min via INTRAVENOUS

## 2021-01-08 NOTE — Progress Notes (Signed)
PROGRESS NOTE    [name]  FOY:[id number] DOB: 21-Apr-1946 DOA: 01/06/2021 PCP: [name], MD   Brief Narrative:  75 year old with history of hepatic cirrhosis, portal hypertension/esophageal varices, recurrent anemia, HCC status posttreatment 06/2020, CAD status post CABG, diastolic CHF EF 76%, CKD stage IIIa, asthma, DM2, HTN, HLD presents with shortness of breath and dizziness with complains of dark stools.  Patient was found to have symptomatic anemia with hemoglobin of 4.8.  Patient received multiple PRBC transfusion during hospitalization.  Endoscopy on 5/13 revealed esophageal varices, acute gastritis, type I gastric varices and portal gastropathy.   Assessment & Plan:   Principal Problem:   Symptomatic anemia Active Problems:   Type 2 diabetes with complication (HCC)   Coronary artery disease involving native coronary artery of native heart without angina pectoris   Hyperlipidemia with target LDL less than 70   Hepatic cirrhosis (HCC)   CKD (chronic kidney disease), stage III (HCC)   (HFpEF) heart failure with preserved ejection fraction (HCC)    Recurrent symptomatic anemia, recurrent GI bleeding Hepatic cirrhosis with portal hypertension and esophageal varices: History of hepatocellular carcinoma status posttreatment 06/2020 -Admission hemoglobin 4.8, status post total 4 units PRBC transfusion.  Hemoglobin today 7.6 - Clear liquid diet, PPI twice daily -Endoscopy on 5/13 revealed esophageal varices, acute gastritis, type I gastric varices and portal gastropathy. - Endoscopy/colonoscopy and capsule endoscopy in March 2022- unrevealing for any definitive causes -IR consulted for their input on B RTO for recurrent bleeding   Type 2 diabetes: With recurrent hypoglycemic episode Peripheral neuropathy -Last A1c 5.3% 08/10/2020.    Hold Amaryl and metformin - Hypoglycemic events have improved therefore stop D10 - D5 half-normal [place] until p.o. intake stable  and consistent -Resume gabapentin when tolerating orals  Chronic HFpEF: EF 72%, grade 3 diastolic dysfunction -Intermittently received Lasix with PRBC transfusion. - Resume oral torsemide when appropriate  CKD stage IIIa: -Creatinine stable around 1.33.  Baseline around 1.2  CAD s/p CABG: -Currently patient denies any chest pain  Hypertension: -Home medications on hold-Aldactone  Asthma: -Continue Breo and albuterol as needed  Hyperlipidemia: -Lipitor   DVT prophylaxis: SCDs Code Status: Full code Family Communication:   Status is: Inpatient  Remains inpatient appropriate because:Inpatient level of care appropriate due to severity of illness   Dispo: The patient is from: Home              Anticipated d/c is to: Home              Patient currently is not medically stable to d/c.  Status post endoscopy today, maintain hospital stay until cleared by GI   Difficult to place patient No   Subjective: Feels okay no complaints  Review of Systems Otherwise negative except as per HPI, including: General: Denies fever, chills, night sweats or unintended weight loss. Resp: Denies cough, wheezing, shortness of breath. Cardiac: Denies chest pain, palpitations, orthopnea, paroxysmal nocturnal dyspnea. GI: Denies abdominal pain, nausea, vomiting, diarrhea or constipation GU: Denies dysuria, frequency, hesitancy or incontinence MS: Denies muscle aches, joint pain or swelling Neuro: Denies headache, neurologic deficits (focal weakness, numbness, tingling), abnormal gait Psych: Denies anxiety, depression, SI/HI/AVH Skin: Denies new rashes or lesions ID: Denies sick contacts, exotic exposures, travel  Examination:  Constitutional: Not in acute distress Respiratory: Clear to auscultation bilaterally Cardiovascular: Normal sinus rhythm, no rubs Abdomen: Nontender nondistended good bowel sounds Musculoskeletal: No edema noted Skin: No rashes seen Neurologic: CN 2-12 grossly  intact.  And nonfocal Psychiatric: Normal  judgment and insight. Alert and oriented x 3. Normal mood.   Objective: Vitals:   01/07/21 2222 01/08/21 0105 01/08/21 0404 01/08/21 0500  BP: (!) 116/55 110/60 (!) 127/49   Pulse: (!) 52 (!) 59 (!) 58   Resp: 15 16 18    Temp: 98 F (36.7 C) 98 F (36.7 C) 98.6 F (37 C)   TempSrc: Oral Oral Oral   SpO2: 98% 97% 97%   Weight:    112.1 kg  Height:        Intake/Output Summary (Last 24 hours) at 01/08/2021 0806 Last data filed at 01/08/2021 0720 Gross per 24 hour  Intake 2443.43 ml  Output 675 ml  Net 1768.43 ml   Filed Weights   01/06/21 1543 01/08/21 0500  Weight: 102 kg 112.1 kg     Data Reviewed:   CBC: Recent Labs  Lab 01/06/21 1630 01/07/21 0725 01/07/21 1753 01/08/21 0346  WBC 3.4* 3.2*  --  3.5*  HGB 4.8* 6.2* 6.8* 7.6*  HCT 18.5* 22.0* 23.7* 25.7*  MCV 77.4* 77.5*  --  78.8*  PLT 133* 131*  --  675*   Basic Metabolic Panel: Recent Labs  Lab 01/06/21 1630 01/07/21 0725 01/08/21 0346  NA 136 135 135  K 3.6 3.5 4.1  CL 104 104 104  CO2 24 24 25   GLUCOSE 73 70 115*  BUN 34* 32* 25*  CREATININE 1.33* 1.20 1.21  CALCIUM 8.5* 8.6* 8.4*  MG  --  2.2  --    GFR: Estimated Creatinine Clearance: 65.1 mL/min (by C-G formula based on SCr of 1.21 mg/dL). Liver Function Tests: Recent Labs  Lab 01/06/21 1630 01/07/21 1753  AST 19 18  ALT 15 14  ALKPHOS 96 101  BILITOT 0.7 1.9*  PROT 7.1 6.7  ALBUMIN 3.7 3.6   No results for input(s): LIPASE, AMYLASE in the last 168 hours. No results for input(s): AMMONIA in the last 168 hours. Coagulation Profile: Recent Labs  Lab 01/07/21 1753  INR 1.2   Cardiac Enzymes: No results for input(s): CKTOTAL, CKMB, CKMBINDEX, TROPONINI in the last 168 hours. BNP (last 3 results) No results for input(s): PROBNP in the last 8760 hours. HbA1C: Recent Labs    01/07/21 1753  HGBA1C 5.4   CBG: Recent Labs  Lab 01/07/21 1557 01/07/21 2013 01/08/21 0010  01/08/21 0400 01/08/21 0722  GLUCAP 106* 230* 176* 119* 89   Lipid Profile: No results for input(s): CHOL, HDL, LDLCALC, TRIG, CHOLHDL, LDLDIRECT in the last 72 hours. Thyroid Function Tests: No results for input(s): TSH, T4TOTAL, FREET4, T3FREE, THYROIDAB in the last 72 hours. Anemia Panel: Recent Labs    01/07/21 0725  VITAMINB12 342  FOLATE 15.0  FERRITIN 6*  TIBC 577*  IRON 56   Sepsis Labs: No results for input(s): PROCALCITON, LATICACIDVEN in the last 168 hours.  Recent Results (from the past 240 hour(s))  Resp Panel by RT-PCR (Flu A&B, Covid) Nasopharyngeal Swab     Status: None   Collection Time: 01/06/21  4:25 PM   Specimen: Nasopharyngeal Swab; Nasopharyngeal(NP) swabs in vial transport medium  Result Value Ref Range Status   SARS Coronavirus 2 by RT PCR NEGATIVE NEGATIVE Final    Comment: (NOTE) SARS-CoV-2 target nucleic acids are NOT DETECTED.  The SARS-CoV-2 RNA is generally detectable in upper respiratory specimens during the acute phase of infection. The lowest concentration of SARS-CoV-2 viral copies this assay can detect is 138 copies/mL. A negative result does not preclude SARS-Cov-2 infection and should not be  used as the sole basis for treatment or other patient management decisions. A negative result may occur with  improper specimen collection/handling, submission of specimen other than nasopharyngeal swab, presence of viral mutation(s) within the areas targeted by this assay, and inadequate number of viral copies(<138 copies/mL). A negative result must be combined with clinical observations, patient history, and epidemiological information. The expected result is Negative.  Fact Sheet for Patients:  EntrepreneurPulse.com.au  Fact Sheet for Healthcare Providers:  IncredibleEmployment.be  This test is no t yet approved or cleared by the [place] FDA and  has been authorized for detection and/or diagnosis  of SARS-CoV-2 by FDA under an Emergency Use Authorization (EUA). This EUA will remain  in effect (meaning this test can be used) for the duration of the COVID-19 declaration under Section 564(b)(1) of the Act, 21 U.S.C.section 360bbb-3(b)(1), unless the authorization is terminated  or revoked sooner.       Influenza A by PCR NEGATIVE NEGATIVE Final   Influenza B by PCR NEGATIVE NEGATIVE Final    Comment: (NOTE) The Xpert Xpress SARS-CoV-2/FLU/RSV plus assay is intended as an aid in the diagnosis of influenza from Nasopharyngeal swab specimens and should not be used as a sole basis for treatment. Nasal washings and aspirates are unacceptable for Xpert Xpress SARS-CoV-2/FLU/RSV testing.  Fact Sheet for Patients: EntrepreneurPulse.com.au  Fact Sheet for Healthcare Providers: IncredibleEmployment.be  This test is not yet approved or cleared by the [place] FDA and has been authorized for detection and/or diagnosis of SARS-CoV-2 by FDA under an Emergency Use Authorization (EUA). This EUA will remain in effect (meaning this test can be used) for the duration of the COVID-19 declaration under Section 564(b)(1) of the Act, 21 U.S.C. section 360bbb-3(b)(1), unless the authorization is terminated or revoked.  Performed at [facility], [place] [address] [place], [place] [address]          Radiology Studies: [facility] Chest Port 1 View  Result Date: 01/06/2021 CLINICAL DATA:  Dyspnea.  Shortness of breath. EXAM: PORTABLE CHEST 1 VIEW COMPARISON:  11/20/2020 FINDINGS: Post median sternotomy and CABG. The heart is enlarged. Stable mediastinal contours. Interstitial edema which appears similar to prior exam. Probable right pleural effusion. No confluent consolidation or pneumothorax. Stable osseous structures. IMPRESSION: Cardiomegaly with interstitial edema, similar to prior exam. Probable right pleural effusion. Electronically Signed    By: Keith Rake M.D.   On: 01/06/2021 16:50        Scheduled Meds: . sodium chloride   Intravenous Once  . atorvastatin  40 mg Oral QHS  . pantoprazole (PROTONIX) IV  40 mg Intravenous Q12H  . sodium chloride flush  3 mL Intravenous Q12H  . umeclidinium-vilanterol  1 puff Inhalation Daily   Continuous Infusions: . sodium chloride Stopped (01/06/21 1728)  . cefTRIAXone (ROCEPHIN)  IV 2 g (01/07/21 1404)  . octreotide  (SANDOSTATIN)    IV infusion 50 mcg/hr (01/08/21 0118)     LOS: 2 days   Time spent= 35 mins    Jelina Paulsen Arsenio Loader, MD [facility]  If 7PM-7AM, please contact night-coverage  01/08/2021, 8:06 AM

## 2021-01-08 NOTE — Interval H&P Note (Signed)
History and Physical Interval Note:  01/08/2021 10:30 AM  [name]  has presented today for surgery, with the diagnosis of anemia, cirrhosis, history of AVMs, history of esophageal varices and gastric varix.  The various methods of treatment have been discussed with the patient and family. After consideration of risks, benefits and other options for treatment, the patient has consented to  Procedure(s): ESOPHAGOGASTRODUODENOSCOPY (EGD) (N/A) as a surgical intervention.  The patient's history has been reviewed, patient examined, no change in status, stable for surgery.  I have reviewed the patient's chart and labs.  Questions were answered to the patient's satisfaction.     Lear Ng

## 2021-01-08 NOTE — Anesthesia Postprocedure Evaluation (Signed)
Anesthesia Post Note  Patient: [name]  Procedure(s) Performed: ESOPHAGOGASTRODUODENOSCOPY (EGD) (N/A ) BIOPSY     Patient location during evaluation: Endoscopy Anesthesia Type: MAC Level of consciousness: awake Pain management: pain level controlled Vital Signs Assessment: post-procedure vital signs reviewed and stable Respiratory status: spontaneous breathing Cardiovascular status: stable Postop Assessment: no apparent nausea or vomiting Anesthetic complications: no   No complications documented.  Last Vitals:  Vitals:   01/08/21 1121 01/08/21 1140  BP: (!) 114/45 138/70  Pulse: 63 64  Resp: 19 20  Temp:    SpO2: 96% 94%    Last Pain:  Vitals:   01/08/21 1121  TempSrc:   PainSc: 0-No pain                 Demiana Crumbley

## 2021-01-08 NOTE — Plan of Care (Signed)
  Problem: Education: Goal: Knowledge of General Education information will improve Description: Including pain rating scale, medication(s)/side effects and non-pharmacologic comfort measures Outcome: Progressing   Problem: Clinical Measurements: Goal: Diagnostic test results will improve Outcome: Progressing Goal: Respiratory complications will improve Outcome: Progressing   Problem: Activity: Goal: Risk for activity intolerance will decrease Outcome: Progressing   

## 2021-01-08 NOTE — Anesthesia Procedure Notes (Signed)
Procedure Name: MAC Date/Time: 01/08/2021 10:34 AM Performed by: Niel Hummer, CRNA Pre-anesthesia Checklist: Emergency Drugs available, Patient identified, Suction available and Patient being monitored Oxygen Delivery Method: Simple face mask

## 2021-01-08 NOTE — Anesthesia Preprocedure Evaluation (Addendum)
Anesthesia Evaluation  Patient identified by MRN, date of birth, ID band Patient awake    Reviewed: Allergy & Precautions, NPO status , Patient's Chart, lab work & pertinent test results  Airway Mallampati: II  TM Distance: >3 FB     Dental   Pulmonary asthma , sleep apnea , former smoker,    breath sounds clear to auscultation       Cardiovascular hypertension, + CAD and +CHF   Rhythm:Regular Rate:Normal     Neuro/Psych    GI/Hepatic PUD, GERD  ,  Endo/Other  diabetes  Renal/GU Renal disease     Musculoskeletal  (+) Arthritis ,   Abdominal   Peds  Hematology  (+) anemia ,   Anesthesia Other Findings   Reproductive/Obstetrics                            Anesthesia Physical Anesthesia Plan  ASA: III  Anesthesia Plan: MAC   Post-op Pain Management:    Induction: Intravenous  PONV Risk Score and Plan: 1 and Propofol infusion  Airway Management Planned: Nasal Cannula and Simple Face Mask  Additional Equipment:   Intra-op Plan:   Post-operative Plan:   Informed Consent: I have reviewed the patients History and Physical, chart, labs and discussed the procedure including the risks, benefits and alternatives for the proposed anesthesia with the patient or authorized representative who has indicated his/her understanding and acceptance.     Dental advisory given  Plan Discussed with: CRNA and Anesthesiologist  Anesthesia Plan Comments:         Anesthesia Quick Evaluation

## 2021-01-08 NOTE — H&P (Signed)
Chief Complaint: Gastric varices  Referring Physician(s): Wilford Corner  Supervising Physician: Aletta Edouard  Patient Status: [facility] - In-pt  History of Present Illness: [name] is a 75 y.o. male with medical issues including cirrhosis,HCC,history of GI bleed with(gastric AVMsin 2019, portal hypertensive gastropathy and grade 1 esophageal varices in 2021), type 1 isolated gastric varices,GERD/Barrett's esophagus,CAD s/p CABG, and HFpEF (EF 55-60%as of12/2020).  He is known to our service. He is s/p Y90 for [facility] by Dr. Viona Gilmore agner on 06/30/20  He presented to the ED on 01/06/21 with symptomatic anemia.  Hgb was 4.8.  EGD done 11/20/20 showed= Minimal spontaneious oozing from body of stomach, possibly localized portal gastropathy or gastritis, without blood in stomach at start of procedure, therefore this finding is of uncertain significance. Grade I esophageal varices. Hemorrhagic gastropathy as described above. Type 1 isolated gastric varices (IGV1, varices located in the fundus), without bleeding.  Normal examined duodenum.  EGD done today showed= Small esophageal varices, acute gastritis, Type 1 isolated gastric varices without bleeding. Portal hypertensive gastropathy.    We are asked to evaluate for possible BRTO.  Past Medical History:  Diagnosis Date  . Arthritis    "joints tighten up sometimes" (03/27/2104)  . Carcinoma of liver, hepatocellular ([place]) 06/30/2020  . CHF (congestive heart failure) ([place])   . Chronic lower back pain   . Coronary artery disease    a. 05/2012 Cath/PCI: LM 40ost, 50-60d, LAD 99p ruptured plaque (3.0x28 DES), LCX 50p/m, RCA 30-40p, 35m EF 65-70%.  . Coronary artery disease involving native coronary artery of native heart without angina pectoris    Severe left main disease at catheterization July 2015  CABG x3 with a LIMA to the LAD, SVG to the OM, SVG to the PDA on 03/31/14. EF 60% by cath.   . Diastolic heart failure ([place]    . GERD without esophagitis 08/04/2010  . Hepatic cirrhosis ([place]    a. Dx 01/2014 - CT a/p   . History of blood transfusion    "related to bleeding ulcers"  . History of concussion    1976--  NO RESIDUAL  . History of GI bleed    a. UGIB 07/2012;  b. 01/2014 admission with GIB/FOB stool req 1U prbc's->EGD showed portal gastropathy, barrett's esoph, and chronic active h. pylori gastritis.  .Marland KitchenHistory of gout    2007 &  2008  LEFT LEG-- NO ISSUE SINCE  . Hyperlipidemia   . Iron deficiency anemia   . Kidney stones   . OA (osteoarthritis of spine)    LOWER BACK--  INTERMITTANT LEFT LEG NUMBNESS  . OSA (obstructive sleep apnea)    PULMOLOGIST-  DR CLANCE--  MODERATE OSA  STARTED CPAP 2012--  BUT CURRENTLY HAS NOT USED PAST 6 MONTHS  . Phimosis    a. s/p circumcision 2015.  . Type 2 diabetes mellitus ([place]   . Unspecified essential hypertension     Past Surgical History:  Procedure Laterality Date  . ANKLE FRACTURE SURGERY Right 1989   "plate put in"  . APPENDECTOMY  05-16-2004   open  . CIRCUMCISION N/A 09/09/2013   Procedure: CIRCUMCISION ADULT;  Surgeon: DBernestine Amass MD;  [facility]  Service: Urology;  Laterality: N/A;  . COLECTOMY  05-16-2004  . COLONOSCOPY WITH PROPOFOL N/A 11/20/2020   Procedure: COLONOSCOPY WITH PROPOFOL;  Surgeon: BRonald Lobo MD;  [facility];  Service: Endoscopy;  Laterality: N/A;  . CORONARY ANGIOPLASTY WITH STENT PLACEMENT  06/28/2012  DR COOPER   PCI W/  X1 DES to [place]. LAD/  LM  40% OSTIAL & 50-60% DISTAL /  50% PROX LCX/  30-40% PROX RCA & 50% MID RCA/   LVEF 65-70%  . CORONARY ARTERY BYPASS GRAFT N/A 03/31/2014   Procedure: CORONARY ARTERY BYPASS GRAFTING (CABG) times 3 using left internal mammary artery and right saphenous vein.;  Surgeon: Melrose Nakayama, MD;  Location: [place];  Service: Open Heart Surgery;  Laterality: N/A;  . DOBUTAMINE STRESS ECHO  06-08-2012   MODERATE HYPOKINESIS/ ISCHEMIA MID  INFERIOR WALL  . ESOPHAGOGASTRODUODENOSCOPY  08/15/2012   Procedure: ESOPHAGOGASTRODUODENOSCOPY (EGD);  Surgeon: Wonda Horner, MD;  Location: [facility] ENDOSCOPY;  Service: Endoscopy;  Laterality: N/A;  . ESOPHAGOGASTRODUODENOSCOPY N/A 02/17/2014   Procedure: ESOPHAGOGASTRODUODENOSCOPY (EGD);  Surgeon: Jeryl Columbia, MD;  Location[facility] ENDOSCOPY;  Service: Endoscopy;  Laterality: N/A;  . ESOPHAGOGASTRODUODENOSCOPY N/A 04/17/2020   Procedure: ESOPHAGOGASTRODUODENOSCOPY (EGD);  Surgeon: Ronnette Juniper, MD;  Location: [place];  Service: Gastroenterology;  Laterality: N/A;  . ESOPHAGOGASTRODUODENOSCOPY N/A 09/11/2020   Procedure: ESOPHAGOGASTRODUODENOSCOPY (EGD);  Surgeon: Clarene Essex, MD;  [facility];  Service: Endoscopy;  Laterality: N/A;  . ESOPHAGOGASTRODUODENOSCOPY  10/09/2020  . ESOPHAGOGASTRODUODENOSCOPY N/A 10/09/2020   Procedure: ESOPHAGOGASTRODUODENOSCOPY (EGD);  Surgeon: Otis Brace, MD;  Location: [facility] ENDOSCOPY;  Service: Gastroenterology;  Laterality: N/A;  . ESOPHAGOGASTRODUODENOSCOPY (EGD) WITH PROPOFOL N/A 09/30/2017   Procedure: ESOPHAGOGASTRODUODENOSCOPY (EGD) WITH PROPOFOL;  Surgeon: Wonda Horner, MD;  Location[facility] END[facility];  Service: Endoscopy;  Laterality: N/A;  . ESOPHAGOGASTRODUODENOSCOPY (EGD) WITH PROPOFOL N/A 12/20/2017   Procedure: ESOPHAGOGASTRODUODENOSCOPY (EGD) WITH PROPOFOL;  Surgeon: Clarene Essex, MD;  Location: [place];  Service: Endoscopy;  Laterality: N/A;  . ESOPHAGOGASTRODUODENOSCOPY (EGD) WITH PROPOFOL N/A 08/10/2020   Procedure: ESOPHAGOGASTRODUODENOSCOPY (EGD) WITH PROPOFOL;  Surgeon: Wilford Corner, MD;  Location: [place];  Service: Endoscopy;  Laterality: N/A;  . ESOPHAGOGASTRODUODENOSCOPY (EGD) WITH PROPOFOL N/A 11/20/2020   Procedure: ESOPHAGOGASTRODUODENOSCOPY (EGD) WITH PROPOFOL;  Surgeon: Ronald Lobo, MD;  [facility];  Service: Endoscopy;  Laterality: N/A;  . GIVENS CAPSULE STUDY N/A 11/20/2020   Procedure: GIVENS  CAPSULE STUDY;  Surgeon: Ronald Lobo, MD;  [facility];  Service: Endoscopy;  Laterality: N/A;  . HERNIA REPAIR    . INTRAOPERATIVE TRANSESOPHAGEAL ECHOCARDIOGRAM N/A 03/31/2014   Procedure: INTRAOPERATIVE TRANSESOPHAGEAL ECHOCARDIOGRAM;  Surgeon: Melrose Nakayama, MD;  Location: [place];  Service: Open Heart Surgery;  Laterality: N/A;  . IR ANGIOGRAM SELECTIVE EACH ADDITIONAL VESSEL  06/16/2020  . IR ANGIOGRAM SELECTIVE EACH ADDITIONAL VESSEL  06/16/2020  . IR ANGIOGRAM SELECTIVE EACH ADDITIONAL VESSEL  06/16/2020  . IR ANGIOGRAM SELECTIVE EACH ADDITIONAL VESSEL  06/16/2020  . IR ANGIOGRAM SELECTIVE EACH ADDITIONAL VESSEL  06/16/2020  . IR ANGIOGRAM SELECTIVE EACH ADDITIONAL VESSEL  06/16/2020  . IR ANGIOGRAM SELECTIVE EACH ADDITIONAL VESSEL  06/16/2020  . IR ANGIOGRAM SELECTIVE EACH ADDITIONAL VESSEL  06/16/2020  . IR ANGIOGRAM SELECTIVE EACH ADDITIONAL VESSEL  06/16/2020  . IR ANGIOGRAM SELECTIVE EACH ADDITIONAL VESSEL  06/30/2020  . IR ANGIOGRAM SELECTIVE EACH ADDITIONAL VESSEL  06/30/2020  . IR ANGIOGRAM VISCERAL SELECTIVE  06/16/2020  . IR ANGIOGRAM VISCERAL SELECTIVE  06/30/2020  . IR EMBO ARTERIAL NOT HEMORR HEMANG INC GUIDE ROADMAPPING  06/16/2020  . IR EMBO TUMOR ORGAN ISCHEMIA INFARCT INC GUIDE ROADMAPPING  06/30/2020  . IR RADIOLOGIST EVAL & MGMT  05/06/2020  . IR RADIOLOGIST EVAL & MGMT  05/20/2020  . IR US GUIDE VASC ACCESS RIGHT  06/16/2020  .  IR US GUIDE VASC ACCESS RIGHT  06/30/2020  . LAPAROSCOPIC UMBILICAL HERNIA REPAIR W/ MESH  06-06-2011  . LEFT HEART CATHETERIZATION WITH CORONARY ANGIOGRAM N/A 03/28/2014   Procedure: LEFT HEART CATHETERIZATION WITH CORONARY ANGIOGRAM;  Surgeon: Sinclair Grooms, MD;  Location: [facility] C[facility] LAB;  Service: Cardiovascular;  Laterality: N/A;  . LIPOMA EXCISION Left 08/25/2017   Procedure: EXCISION LIPOMA LEFT POSTERIOR THIGH;  Surgeon: Clovis Riley, MD;  Location: [place];  Service: General;  Laterality: Left;  . NEPHROLITHOTOMY   1990'S  . OPEN APPENDECTOMY W/ PARTIAL CECECTOMY  05-16-2004  . PERCUTANEOUS CORONARY STENT INTERVENTION (PCI-S) N/A 06/28/2012   Procedure: PERCUTANEOUS CORONARY STENT INTERVENTION (PCI-S);  Surgeon: Sherren Mocha, MD;  Location: [facility] CATH LAB;  Service: Cardiovascular;  Laterality: N/A;  . POLYPECTOMY  11/20/2020   Procedure: POLYPECTOMY;  Surgeon: Ronald Lobo, MD;  [facility];  Service: Endoscopy;;    Allergies: Fluzone quadrivalent [influenza vac split quad]  Medications: Prior to Admission medications   Medication Sig Start Date End Date Taking? Authorizing Provider  albuterol (VENTOLIN HFA) 108 (90 Base) MCG/ACT inhaler Inhale 2 puffs into the lungs every 6 (six) hours as needed for wheezing or shortness of breath. 03/12/20  Yes [name], MD  atorvastatin (LIPITOR) 40 MG tablet TAKE 1 TABLET BY MOUTH DAILY AT 6 PM Patient taking differently: Take 40 mg by mouth daily at 6 PM. 01/04/21  Yes [name], MD  gabapentin (NEURONTIN) 100 MG capsule TAKE 1 CAPSULE(100 MG) BY MOUTH TWICE DAILY Patient taking differently: Take 100 mg by mouth 2 (two) times daily. 08/03/20  Yes [name], MD  glimepiride (AMARYL) 2 MG tablet TAKE 1 TABLET(2 MG) BY MOUTH DAILY BEFORE BREAKFAST Patient taking differently: Take 2 mg by mouth daily with breakfast. 12/16/20  Yes [name], MD  hydrocortisone (ANUSOL-HC) 2.5 % rectal cream Place 1 application rectally 2 (two) times daily. Patient taking differently: Place 1 application rectally 2 (two) times daily as needed for hemorrhoids. 12/04/20  Yes [name], MD  nitroGLYCERIN (NITROSTAT) 0.4 MG SL tablet Place 1 tablet (0.4 mg total) under the tongue every 5 (five) minutes as needed for chest pain. 06/08/16  Yes Nahser, Wonda Cheng, MD  polyethylene glycol powder (GLYCOLAX/MIRALAX) 17 GM/SCOOP powder Take 8.5 g by mouth in the morning, at noon, and at bedtime.   Yes [provider]   spironolactone (ALDACTONE) 25 MG tablet Take 1 tablet (25 mg total) by mouth daily. 08/18/20  Yes Danford, Suann Larry, MD  sucralfate (CARAFATE) 1 g tablet Take 1 tablet (1 g total) by mouth 4 (four) times daily. Patient taking differently: Take 2 g by mouth in the morning and at bedtime. 11/23/20 12/23/20 Yes Elodia Florence., MD  torsemide (DEMADEX) 20 MG tablet Take 2 tablets (40 mg total) by mouth 2 (two) times daily. 08/18/20  Yes Danford, Suann Larry, MD  umeclidinium-vilanterol (ANORO ELLIPTA) 62.5-25 MCG/INH AEPB Inhale 1 puff into the lungs daily. 03/12/20  Yes [name], MD  Accu-Chek Softclix Lancets lancets USE AS DIRECTED TO TEST BLOOD SUGAR FOUR TIMES DAILY 03/13/20   [name], MD  blood glucose meter kit and supplies Dispense based on patient and insurance preference. Use up to four times daily as directed. (FOR ICD-10 E10.9, E11.9). 03/12/20   [name], MD  metFORMIN (GLUCOPHAGE) 850 MG tablet TAKE 1 TABLET(850 MG) BY MOUTH TWICE DAILY WITH A MEAL 01/08/21   [name], MD  pantoprazole (PROTONIX) 40 MG tablet Take 1 tablet (40 mg total) by mouth 2 (two) times daily. Patient not taking: No sig reported 09/12/20   Pokhrel, Corrie Mckusick, MD  potassium chloride (KLOR-CON) 20 MEQ tablet Take 1 tablet (20 mEq total) by mouth daily. Patient not taking: No sig reported 08/18/20   Edwin Dada, MD  Psyllium (METAMUCIL PO) Take 15 mLs by mouth 3 (three) times daily with meals. Mix with 4 oz of juice or water    [provider]     Family History  Problem Relation Age of Onset  . Lung cancer Sister   . Cancer Sister        lung  . Cancer Mother   . Cancer Father        died in his 38s.  . Cancer Brother        lung  . Coronary artery disease Other   . Diabetes Other   . Colon cancer Other   . Cancer Sister        lung    Social History   Socioeconomic History  . Marital status: Married    Spouse name: Not on  file  . Number of children: Not on file  . Years of education: Not on file  . Highest education level: Not on file  Occupational History  . Not on file  Tobacco Use  . Smoking status: Former Smoker    Packs/day: 2.00    Years: 40.00    Pack years: 80.00    Types: Cigarettes    Quit date: 08/29/1996    Years since quitting: 24.3  . Smokeless tobacco: Never Used  Vaping Use  . Vaping Use: Never used  Substance and Sexual Activity  . Alcohol use: Yes    Alcohol/week: 8.0 standard drinks    Types: 8 Cans of beer per week    Comment: 2 beers every other day  . Drug use: No  . Sexual activity: Yes  Other Topics Concern  . Not on file  Social History Narrative   Lives in [place] with wife, son, and son's family.   Social Determinants of Health   Financial Resource Strain: Medium Risk  . Difficulty of Paying Living Expenses: Somewhat hard  Food Insecurity: No Food Insecurity  . Worried About Charity fundraiser in the Last Year: Never true  . Ran Out of Food in the Last Year: Never true  Transportation Needs: No Transportation Needs  . Lack of Transportation (Medical): No  . Lack of Transportation (Non-Medical): No  Physical Activity: Not on file  Stress: Not on file  Social Connections: Not on file     Review of Systems: A 12 point ROS discussed and pertinent positives are indicated in the HPI above.  All other systems are negative.  Review of Systems  Vital Signs: BP 138/70 (BP Location: Right Arm)   Pulse 64   Temp 98 F (36.7 C) (Oral)   Resp 20   Ht '5\' 8"'  (1.727 m)   Wt 112.1 kg   SpO2 94%   BMI 37.58 kg/m   Physical Exam Constitutional:      Appearance: Normal appearance.  HENT:     Head: Normocephalic and atraumatic.  Cardiovascular:     Rate and Rhythm: Normal rate.  Pulmonary:     Effort: Pulmonary effort is normal. No respiratory distress.  Abdominal:     Palpations: Abdomen is soft.     Tenderness: There is no abdominal tenderness.  Skin:  General: Skin is warm and dry.  Neurological:     General: No focal deficit present.     Mental Status: He is alert and oriented to person, place, and time.  Psychiatric:        Mood and Affect: Mood normal.        Behavior: Behavior normal.        Thought Content: Thought content normal.        Judgment: Judgment normal.     Imaging: DG Chest Port 1 View  Result Date: 01/06/2021 CLINICAL DATA:  Dyspnea.  Shortness of breath. EXAM: PORTABLE CHEST 1 VIEW COMPARISON:  11/20/2020 FINDINGS: Post median sternotomy and CABG. The heart is enlarged. Stable mediastinal contours. Interstitial edema which appears similar to prior exam. Probable right pleural effusion. No confluent consolidation or pneumothorax. Stable osseous structures. IMPRESSION: Cardiomegaly with interstitial edema, similar to prior exam. Probable right pleural effusion. Electronically Signed   By: Keith Rake M.D.   On: 01/06/2021 16:50    Labs:  CBC: Recent Labs    12/04/20 1915 01/06/21 1630 01/07/21 0725 01/07/21 1753 01/08/21 0346  WBC 3.7* 3.4* 3.2*  --  3.5*  HGB 7.7* 4.8* 6.2* 6.8* 7.6*  HCT 26.5* 18.5* 22.0* 23.7* 25.7*  PLT 147* 133* 131*  --  136*    COAGS: Recent Labs    09/10/20 1833 10/09/20 1039 11/18/20 1324 01/07/21 1753  INR 1.1 1.2 1.2 1.2    BMP: Recent Labs    04/22/20 0953 04/23/20 0417 06/01/20 1414 06/16/20 0845 08/25/20 1420 09/10/20 1442 12/04/20 1915 01/06/21 1630 01/07/21 0725 01/08/21 0346  NA 137 139 141   < > 139   < > 139 136 135 135  K 4.4 4.0 3.7   < > 3.8   < > 4.4 3.6 3.5 4.1  CL 103 103 102   < > 102   < > 107 104 104 104  CO2 '23 27 28   ' < > 24   < > '25 24 24 25  ' GLUCOSE 190* 105* 149*   < > 209*   < > 125* 73 70 115*  BUN 16 19 24*   < > 26   < > 31* 34* 32* 25*  CALCIUM 9.1 8.7* 8.5*   < > 8.7   < > 8.8* 8.5* 8.6* 8.4*  CREATININE 1.27* 1.49* 1.06   < > 1.35*   < > 1.00 1.33* 1.20 1.21  GFRNONAA 55* 46* >60   < > 51*   < > >60 56* >60 >60  GFRAA  >60 53* >60  --  59*  --   --   --   --   --    < > = values in this interval not displayed.    LIVER FUNCTION TESTS: Recent Labs    12/04/20 1543 12/04/20 1915 01/06/21 1630 01/07/21 1753  BILITOT 0.5 0.7 0.7 1.9*  AST '24 27 19 18  ' ALT '17 21 15 14  ' ALKPHOS 150* 145* 96 101  PROT 7.1 7.4 7.1 6.7  ALBUMIN 3.9 3.9 3.7 3.6    TUMOR MARKERS: No results for input(s): AFPTM, CEA, CA199, CHROMGRNA in the last 8760 hours.  Assessment and Plan:  Symptomatic anemia = Hgb 4.8 on admission.  EGD showed gastric varices with no active bleeding.  Will obtain CT scan with BRTO protocol to initiate workup for procedure.  It is possible he may need a TIPS as well. His Na MELD is 15.  Thank you for this  interesting consult.  I greatly enjoyed meeting [name] and look forward to participating in their care.  A copy of this report was sent to the requesting provider on this date.  Electronically Signed: Murrell Redden, PA-C   01/08/2021, 12:40 PM      I spent a total of   25 Minutes in face to face in clinical consultation, greater than 50% of which was counseling/coordinating care for consideration for BRTO/TIPS.

## 2021-01-08 NOTE — Transfer of Care (Signed)
Immediate Anesthesia Transfer of Care Note  Patient: [name]  Procedure(s) Performed: ESOPHAGOGASTRODUODENOSCOPY (EGD) (N/A ) BIOPSY  Patient Location: PACU  Anesthesia Type:MAC  Level of Consciousness: awake, alert  and oriented  Airway & Oxygen Therapy: Patient Spontanous Breathing and Patient connected to face mask oxygen  Post-op Assessment: Report given to RN, Post -op Vital signs reviewed and stable and Patient moving all extremities X 4  Post vital signs: Reviewed and stable  Last Vitals:  Vitals Value Taken Time  BP    Temp    Pulse 64 01/08/21 1054  Resp 15 01/08/21 1054  SpO2 100 % 01/08/21 1054  Vitals shown include unvalidated device data.  Last Pain:  Vitals:   01/08/21 0933  TempSrc: Oral  PainSc: 0-No pain         Complications: No complications documented.

## 2021-01-08 NOTE — Op Note (Signed)
[facility] Patient Name: [name] Procedure Date: 01/08/2021 MRN: [id number] Attending MD: Lear Ng , MD Date of Birth: [birth year] CSN: [id number] Age: 75 Admit Type: Inpatient Procedure:                Upper GI endoscopy Indications:              Acute post hemorrhagic anemia, Melena Providers:                Lear Ng, MD, Clyde Lundborg, RN,                            Fransico Setters Mbumina, Technician Referring MD:             hospital team Medicines:                Propofol per Anesthesia, Monitored Anesthesia Care Complications:            No immediate complications. Estimated Blood Loss:     Estimated blood loss was minimal. Procedure:                Pre-Anesthesia Assessment:                           - Prior to the procedure, a History and Physical                            was performed, and patient medications and                            allergies were reviewed. The patient's tolerance of                            previous anesthesia was also reviewed. The risks                            and benefits of the procedure and the sedation                            options and risks were discussed with the patient.                            All questions were answered, and informed consent                            was obtained. Prior Anticoagulants: The patient has                            taken no previous anticoagulant or antiplatelet                            agents. ASA Grade Assessment: III - A patient with                            severe systemic disease. After reviewing the risks  and benefits, the patient was deemed in                            satisfactory condition to undergo the procedure.                           After obtaining informed consent, the endoscope was                            passed under direct vision. Throughout the                            procedure, the patient's blood  pressure, pulse, and                            oxygen saturations were monitored continuously. The                            GIF-H190 ([id number]) Olympus gastroscope was                            introduced through the mouth, and advanced to the                            second part of duodenum. The upper GI endoscopy was                            accomplished without difficulty. The patient                            tolerated the procedure well. Scope In: Scope Out: Findings:      Small (< 5 mm) varices were found in the distal esophagus.      The Z-line was regular and was found 40 cm from the incisors.      Segmental moderate inflammation characterized by congestion (edema),       erosions and erythema was found in the gastric antrum. Biopsies were       taken with a cold forceps for histology. Estimated blood loss was       minimal.      Type 1 isolated gastric varices (IGV1, varices located in the fundus)       with no bleeding were found in the cardia. There were no stigmata of       recent bleeding.      The examined duodenum was normal.      Mild portal hypertensive gastropathy was found in the gastric fundus. Impression:               - Small (< 5 mm) esophageal varices.                           - Z-line regular, 40 cm from the incisors.                           - Acute gastritis. Biopsied.                           -  Type 1 isolated gastric varices (IGV1, varices                            located in the fundus), without bleeding.                           - Normal examined duodenum.                           - Portal hypertensive gastropathy. Moderate Sedation:      Not Applicable - Patient had care per Anesthesia. Recommendation:           - Clear liquid diet.                           - Await pathology results.                           - Observe patient's clinical course. Procedure Code(s):        --- Professional ---                           [phone],  Esophagogastroduodenoscopy, flexible,                            transoral; with biopsy, single or multiple Diagnosis Code(s):        --- Professional ---                           K76.6, Portal hypertension                           K92.1, Melena (includes Hematochezia)                           D62, Acute posthemorrhagic anemia                           I86.4, Gastric varices                           K29.00, Acute gastritis without bleeding                           I85.00, Esophageal varices without bleeding                           K31.89, Other diseases of stomach and duodenum CPT copyright 2019 [employer] Association. All rights reserved. The codes documented in this report are preliminary and upon coder review may  be revised to meet current compliance requirements. Lear Ng, MD 01/08/2021 11:01:25 AM This report has been signed electronically. Number of Addenda: 0

## 2021-01-08 NOTE — Brief Op Note (Signed)
Isolated gastric varix without stigmata. Small esophageal varices without stigmata. Portal gastropathy. Gastritis. See endopro note for details. Recommend IR consult for BRTO. Advance diet. [facility] will f/u tomorrow.

## 2021-01-09 LAB — CBC
HCT: 25.8 % — ABNORMAL LOW (ref 39.0–52.0)
Hemoglobin: 7.4 g/dL — ABNORMAL LOW (ref 13.0–17.0)
MCH: 23.3 pg — ABNORMAL LOW (ref 26.0–34.0)
MCHC: 28.7 g/dL — ABNORMAL LOW (ref 30.0–36.0)
MCV: 81.1 fL (ref 80.0–100.0)
Platelets: 120 10*3/uL — ABNORMAL LOW (ref 150–400)
RBC: 3.18 MIL/uL — ABNORMAL LOW (ref 4.22–5.81)
RDW: 20.4 % — ABNORMAL HIGH (ref 11.5–15.5)
WBC: 3.1 10*3/uL — ABNORMAL LOW (ref 4.0–10.5)
nRBC: 0 % (ref 0.0–0.2)

## 2021-01-09 LAB — GLUCOSE, CAPILLARY
Glucose-Capillary: 131 mg/dL — ABNORMAL HIGH (ref 70–99)
Glucose-Capillary: 131 mg/dL — ABNORMAL HIGH (ref 70–99)
Glucose-Capillary: 133 mg/dL — ABNORMAL HIGH (ref 70–99)
Glucose-Capillary: 141 mg/dL — ABNORMAL HIGH (ref 70–99)
Glucose-Capillary: 144 mg/dL — ABNORMAL HIGH (ref 70–99)
Glucose-Capillary: 168 mg/dL — ABNORMAL HIGH (ref 70–99)
Glucose-Capillary: 178 mg/dL — ABNORMAL HIGH (ref 70–99)

## 2021-01-09 LAB — MAGNESIUM: Magnesium: 2.2 mg/dL (ref 1.7–2.4)

## 2021-01-09 LAB — BASIC METABOLIC PANEL
Anion gap: 8 (ref 5–15)
BUN: 22 mg/dL (ref 8–23)
CO2: 21 mmol/L — ABNORMAL LOW (ref 22–32)
Calcium: 8.3 mg/dL — ABNORMAL LOW (ref 8.9–10.3)
Chloride: 103 mmol/L (ref 98–111)
Creatinine, Ser: 1.01 mg/dL (ref 0.61–1.24)
GFR, Estimated: >60 mL/min (ref >60)
Glucose, Bld: 137 mg/dL — ABNORMAL HIGH (ref 70–99)
Potassium: 4.3 mmol/L (ref 3.5–5.1)
Sodium: 132 mmol/L — ABNORMAL LOW (ref 135–145)

## 2021-01-09 MED ORDER — IPRATROPIUM-ALBUTEROL 0.5-2.5 (3) MG/3ML IN SOLN
3.0000 mL | RESPIRATORY_TRACT | Status: DC | PRN
  Administered 2021-01-11: 3 mL via RESPIRATORY_TRACT
  Filled 2021-01-09: qty 3

## 2021-01-09 MED ORDER — SPIRONOLACTONE 25 MG PO TABS
25.0000 mg | ORAL_TABLET | Freq: Every day | ORAL | Status: DC
  Administered 2021-01-09 – 2021-01-13 (×5): 25 mg via ORAL
  Filled 2021-01-09 (×5): qty 1

## 2021-01-09 MED ORDER — GABAPENTIN 100 MG PO CAPS
100.0000 mg | ORAL_CAPSULE | Freq: Two times a day (BID) | ORAL | Status: DC
  Administered 2021-01-09 – 2021-01-17 (×17): 100 mg via ORAL
  Filled 2021-01-09 (×17): qty 1

## 2021-01-09 MED ORDER — TORSEMIDE 20 MG PO TABS
40.0000 mg | ORAL_TABLET | Freq: Two times a day (BID) | ORAL | Status: DC
  Administered 2021-01-09 – 2021-01-13 (×10): 40 mg via ORAL
  Filled 2021-01-09 (×12): qty 2

## 2021-01-09 NOTE — Progress Notes (Signed)
[name] 9:24 AM  Subjective: Patient seen and examined and case discussed with Dr. Michail Sermon in his hospital computer chart reviewed and he is eating fine without any abdominal pain and is waiting to hear back from radiology regarding his possible procedure and his CT scan has been done and reviewed  Objective: Vital signs stable afebrile no acute distress abdomen is soft nontender CT reviewed BUN and creatinine stable hemoglobin stable  Assessment: Gastric varices and cirrhosis and chronic GI blood loss  Plan: Await radiology decision regarding intervention and please call me this weekend if I can be of any further assistance  [facility]  office [phone] After 5PM or if no answer call [phone]

## 2021-01-09 NOTE — Progress Notes (Signed)
Called to room d/t pt c/o SOB. Upon arrival to room pt was resting quietly with eyes closed. Rise and fall of the chest noted. No outward signs of resp distress noted. VSS and Sats 98-99%. Resp 14. Noted slightly more wheezing to posterior lung field. When pt awake by this RN. He stated that he was having trouble breathing but felt better. Will update provider with my findings

## 2021-01-09 NOTE — Progress Notes (Addendum)
PROGRESS NOTE    [name]  TML:[id number] DOB: 1946/04/11 DOA: 01/06/2021 PCP: [name], MD   Brief Narrative:  75 year old with history of hepatic cirrhosis, portal hypertension/esophageal varices, recurrent anemia, HCC status posttreatment 06/2020, CAD status post CABG, diastolic CHF EF 68%, CKD stage IIIa, asthma, DM2, HTN, HLD presents with shortness of breath and dizziness with complains of dark stools.  Patient was found to have symptomatic anemia with hemoglobin of 4.8.  Patient received multiple PRBC transfusion during hospitalization.  Endoscopy on 5/13 revealed esophageal varices, acute gastritis, type I gastric varices and portal gastropathy.  IR consulted for possible BR TO/TIPS   Assessment & Plan:   Principal Problem:   Symptomatic anemia Active Problems:   Type 2 diabetes with complication (HCC)   Coronary artery disease involving native coronary artery of native heart without angina pectoris   Hyperlipidemia with target LDL less than 70   Hepatic cirrhosis (HCC)   CKD (chronic kidney disease), stage III (HCC)   (HFpEF) heart failure with preserved ejection fraction (HCC)    Recurrent symptomatic anemia, recurrent GI bleeding Hepatic cirrhosis with portal hypertension and esophageal varices: History of hepatocellular carcinoma status posttreatment 06/2020 -Admission hemoglobin 4.8, status post total 4 units PRBC transfusion.  Hemoglobin today 7.4 - Clear liquid diet, PPI twice daily -Endoscopy on 5/13 revealed esophageal varices, acute gastritis, type I gastric varices and portal gastropathy. - Endoscopy/colonoscopy and capsule endoscopy in March 2022- unrevealing for any definitive causes -IR consulted for their input on B RTO for recurrent bleeding.  CT abdomen pelvis angio done-results to be reviewed by their service   Type 2 diabetes: With recurrent hypoglycemic episode Peripheral neuropathy -Last A1c 5.3% 08/10/2020.    Hold Amaryl and  metformin - Blood glucose appears to be more stable now - Insulin sliding scale and Accu-Chek -Resume gabapentin when tolerating orals  Chronic HFpEF: EF 12%, grade 3 diastolic dysfunction -resume torsemide and Aldactone  CKD stage IIIa: -Creatinine stable around 1.33.  Baseline around 1.2  CAD s/p CABG: -Currently patient denies any chest pain  Hypertension: -Home medications on hold-Aldactone  Asthma: -Continue Breo and albuterol as needed  Hyperlipidemia: -Lipitor   DVT prophylaxis: SCDs Code Status: Full code Family Communication:  [place] Status is: Inpatient  Remains inpatient appropriate because:Inpatient level of care appropriate due to severity of illness   Dispo: The patient is from: Home              Anticipated d/c is to: Home              Patient currently is not medically stable to d/c.  Status post endoscopypy undergoing IR eval for BRTO vs TIPS   Difficult to place patient No   Subjective: Feels ok, no complaints.   Review of Systems Otherwise negative except as per HPI, including: General = no fevers, chills, dizziness,  fatigue HEENT/EYES = negative for loss of vision, double vision, blurred vision,  sore throa Cardiovascular= negative for chest pain, palpitation Respiratory/lungs= negative for shortness of breath, cough, wheezing; hemoptysis,  Gastrointestinal= negative for nausea, vomiting, abdominal pain Genitourinary= negative for Dysuria MSK = Negative for arthralgia, myalgias Neurology= Negative for headache, numbness, tingling  Psychiatry= Negative for suicidal and homocidal ideation Skin= Negative for Rash   Examination: Constitutional: Not in acute distress Respiratory: Clear to auscultation bilaterally Cardiovascular: Normal sinus rhythm, no rubs Abdomen: Nontender nondistended good bowel sounds Musculoskeletal: No edema noted Skin: No rashes seen Neurologic: CN 2-12 grossly intact.  And nonfocal  Psychiatric: Normal  judgment and insight. Alert and oriented x 3. Normal mood.     Objective: Vitals:   01/08/21 1145 01/08/21 2027 01/09/21 0438 01/09/21 0600  BP:  140/61 (!) 125/54   Pulse:  (!) 53 (!) 52   Resp:  18 20   Temp: 98 F (36.7 C) 98.4 F (36.9 C) 97.9 F (36.6 C)   TempSrc: Oral Oral Oral   SpO2:  99% 94%   Weight:    114.3 kg  Height:        Intake/Output Summary (Last 24 hours) at 01/09/2021 0824 Last data filed at 01/09/2021 0736 Gross per 24 hour  Intake 1820 ml  Output 1975 ml  Net -155 ml   Filed Weights   01/08/21 0500 01/08/21 0933 01/09/21 0600  Weight: 112.1 kg 112.1 kg 114.3 kg     Data Reviewed:   CBC: Recent Labs  Lab 01/06/21 1630 01/07/21 0725 01/07/21 1753 01/08/21 0346 01/09/21 0508  WBC 3.4* 3.2*  --  3.5* 3.1*  HGB 4.8* 6.2* 6.8* 7.6* 7.4*  HCT 18.5* 22.0* 23.7* 25.7* 25.8*  MCV 77.4* 77.5*  --  78.8* 81.1  PLT 133* 131*  --  136* 920*   Basic Metabolic Panel: Recent Labs  Lab 01/06/21 1630 01/07/21 0725 01/08/21 0346 01/09/21 0508  NA 136 135 135 132*  K 3.6 3.5 4.1 4.3  CL 104 104 104 103  CO2 24 24 25  21*  GLUCOSE 73 70 115* 137*  BUN 34* 32* 25* 22  CREATININE 1.33* 1.20 1.21 1.01  CALCIUM 8.5* 8.6* 8.4* 8.3*  MG  --  2.2  --  2.2   GFR: Estimated Creatinine Clearance: 78.8 mL/min (by C-G formula based on SCr of 1.01 mg/dL). Liver Function Tests: Recent Labs  Lab 01/06/21 1630 01/07/21 1753  AST 19 18  ALT 15 14  ALKPHOS 96 101  BILITOT 0.7 1.9*  PROT 7.1 6.7  ALBUMIN 3.7 3.6   No results for input(s): LIPASE, AMYLASE in the last 168 hours. No results for input(s): AMMONIA in the last 168 hours. Coagulation Profile: Recent Labs  Lab 01/07/21 1753  INR 1.2   Cardiac Enzymes: No results for input(s): CKTOTAL, CKMB, CKMBINDEX, TROPONINI in the last 168 hours. BNP (last 3 results) No results for input(s): PROBNP in the last 8760 hours. HbA1C: Recent Labs    01/07/21 1753  HGBA1C 5.4   CBG: Recent Labs   Lab 01/08/21 1730 01/08/21 2044 01/09/21 0033 01/09/21 0443 01/09/21 0734  GLUCAP 113* 151* 131* 131* 141*   Lipid Profile: No results for input(s): CHOL, HDL, LDLCALC, TRIG, CHOLHDL, LDLDIRECT in the last 72 hours. Thyroid Function Tests: No results for input(s): TSH, T4TOTAL, FREET4, T3FREE, THYROIDAB in the last 72 hours. Anemia Panel: Recent Labs    01/07/21 0725  VITAMINB12 342  FOLATE 15.0  FERRITIN 6*  TIBC 577*  IRON 56   Sepsis Labs: No results for input(s): PROCALCITON, LATICACIDVEN in the last 168 hours.  Recent Results (from the past 240 hour(s))  Resp Panel by RT-PCR (Flu A&B, Covid) Nasopharyngeal Swab     Status: None   Collection Time: 01/06/21  4:25 PM   Specimen: Nasopharyngeal Swab; Nasopharyngeal(NP) swabs in vial transport medium  Result Value Ref Range Status   SARS Coronavirus 2 by RT PCR NEGATIVE NEGATIVE Final    Comment: (NOTE) SARS-CoV-2 target nucleic acids are NOT DETECTED.  The SARS-CoV-2 RNA is generally detectable in upper respiratory specimens during the acute phase of infection. The  lowest concentration of SARS-CoV-2 viral copies this assay can detect is 138 copies/mL. A negative result does not preclude SARS-Cov-2 infection and should not be used as the sole basis for treatment or other patient management decisions. A negative result may occur with  improper specimen collection/handling, submission of specimen other than nasopharyngeal swab, presence of viral mutation(s) within the areas targeted by this assay, and inadequate number of viral copies(<138 copies/mL). A negative result must be combined with clinical observations, patient history, and epidemiological information. The expected result is Negative.  Fact Sheet for Patients:  EntrepreneurPulse.com.au  Fact Sheet for Healthcare Providers:  IncredibleEmployment.be  This test is no t yet approved or cleared by the [place] FDA and   has been authorized for detection and/or diagnosis of SARS-CoV-2 by FDA under an Emergency Use Authorization (EUA). This EUA will remain  in effect (meaning this test can be used) for the duration of the COVID-19 declaration under Section 564(b)(1) of the Act, 21 U.S.C.section 360bbb-3(b)(1), unless the authorization is terminated  or revoked sooner.       Influenza A by PCR NEGATIVE NEGATIVE Final   Influenza B by PCR NEGATIVE NEGATIVE Final    Comment: (NOTE) The Xpert Xpress SARS-CoV-2/FLU/RSV plus assay is intended as an aid in the diagnosis of influenza from Nasopharyngeal swab specimens and should not be used as a sole basis for treatment. Nasal washings and aspirates are unacceptable for Xpert Xpress SARS-CoV-2/FLU/RSV testing.  Fact Sheet for Patients: EntrepreneurPulse.com.au  Fact Sheet for Healthcare Providers: IncredibleEmployment.be  This test is not yet approved or cleared by the [place] FDA and has been authorized for detection and/or diagnosis of SARS-CoV-2 by FDA under an Emergency Use Authorization (EUA). This EUA will remain in effect (meaning this test can be used) for the duration of the COVID-19 declaration under Section 564(b)(1) of the Act, 21 U.S.C. section 360bbb-3(b)(1), unless the authorization is terminated or revoked.  Performed at [facility], [place] [address], [place],  [address]          Radiology Studies: CT Angio Abd/Pel w/ and/or w/o  Result Date: 01/08/2021 CLINICAL DATA:  75 year old male with history of cirrhosis, hepatocellular carcinoma status post radiation segmentectomy segment 8 in November 2021, and gastroesophageal varices presenting with anemia and possible gastrointestinal hemorrhage. EXAM: CTA ABDOMEN AND PELVIS WITH CONTRAST TECHNIQUE: Multidetector CT imaging of the abdomen and pelvis was performed using the standard protocol before, during, and after  bolus administration of intravenous contrast. Multiplanar reconstructed images and MIPs were obtained and reviewed to evaluate the vascular anatomy. CONTRAST:  185mL OMNIPAQUE IOHEXOL 350 MG/ML SOLN COMPARISON:  04/20/2020 FINDINGS: VASCULAR Aorta: Normal caliber aorta without aneurysm, dissection, vasculitis or significant stenosis. Circumferential atherosclerotic calcifications. Celiac: Patent without evidence of aneurysm, dissection, vasculitis or significant stenosis. SMA: Patent without evidence of aneurysm, dissection, vasculitis or significant stenosis. Renals: Severe stenosis of the single right renal ostia secondary prominent atherosclerotic plaque. The dual left renal arteries are patent. IMA: Patent without evidence of aneurysm, dissection, vasculitis or significant stenosis. Inflow: Patent without evidence of aneurysm, dissection, vasculitis or significant stenosis. Proximal Outflow: Bilateral common femoral and visualized portions of the superficial and profunda femoral arteries are patent without evidence of aneurysm, dissection, vasculitis or significant stenosis. Veins: The hepatic veins appear patent. The portal system appears widely patent. Gastroesophageal varices arise from the splenic vein. No evidence of portosystemic shunt. The renal veins are widely patent. No evidence of iliocaval anomaly or thrombus. Review of the MIP images confirms  the above findings. NON-VASCULAR Lower chest: Small right and trace left pleural effusions with associated passive bibasilar atelectasis. Mild diffuse ground-glass pulmonary opacities and interlobular septal thickening. Mild global cardiomegaly. No pericardial effusion. Severe coronary atherosclerotic calcifications. Partially visualized calcified mediastinal and hilar lymph nodes. Hepatobiliary: Shrunken and nodular contour of the liver compatible with history of cirrhosis. Ill-defined hypodensity in segment 8 measuring approximately 3.5 cm without appreciable  enhancement on arterial or delayed venous phases. No new hepatomas. The gallbladder is present unremarkable. No intra or extrahepatic biliary ductal dilation. Pancreas: Unremarkable. No pancreatic ductal dilatation or surrounding inflammatory changes. Spleen: Upper limits of normal size measuring up to 13 mm in craniocaudal dimension. Scattered punctate calcifications. No mass. Adrenals/Urinary Tract: Adrenal glands are unremarkable. Unchanged diffuse atrophy of the right kidney with similar appearing upper pole nonobstructive renal calculus measuring up to 14 mm. The left kidney is normal. No hydronephrosis. The bladder is distended without wall thickening. Bladder is unremarkable. Stomach/Bowel: Multiple prominent and patent distal esophageal varices are visualized. Multiple prominent varices about the gastric cardia. Stomach is otherwise within normal limits. Appendix is surgically absent. No evidence of bowel wall thickening, distention, or inflammatory changes. No evidence of intraluminal hemorrhage. Lymphatic: Prominent bilateral inguinal lymph nodes with preserved fatty hila, likely reactive. No abdominopelvic lymphadenopathy. Reproductive: Prostate is unremarkable. Other: Trace ascites.  Diffuse body wall anasarca. Musculoskeletal: No acute or significant osseous findings. IMPRESSION: VASCULAR 1. No evidence of gastrointestinal hemorrhage, as queried. 2. Prominent esophageal and gastric varices. No evidence of renal portosystemic shunt. 3. Similar appearing severe focal stenosis about the right renal artery ostium with associated chronic right renal atrophy. NON-VASCULAR 1. Similar appearing morphologic changes of cirrhosis with secondary signs of portal hypertension including trace ascites, gastroesophageal varices, small right trace left pleural effusions, mild pulmonary edema, and body wall anasarca. With similar appearing cardiomegaly, there is likely partial cardiogenic etiology to fluid overload  status. 2. Postprocedural changes after segment 8 hepatic radiation segmentectomy without evidence of persistent enhancing mass. 3. Evidence of prior granulomatous disease. 4. Nonobstructive right upper pole nephrolithiasis, similar to comparison. Ruthann Cancer, MD [facility] Electronically Signed   By: Ruthann Cancer MD   On: 01/08/2021 16:19        Scheduled Meds: . sodium chloride   Intravenous Once  . atorvastatin  40 mg Oral QHS  . pantoprazole (PROTONIX) IV  40 mg Intravenous Q12H  . sodium chloride flush  3 mL Intravenous Q12H  . umeclidinium-vilanterol  1 puff Inhalation Daily   Continuous Infusions: . sodium chloride Stopped (01/06/21 1728)  . cefTRIAXone (ROCEPHIN)  IV 2 g (01/07/21 1404)  . octreotide  (SANDOSTATIN)    IV infusion 50 mcg/hr (01/09/21 0218)     LOS: 3 days   Time spent= 35 mins    Hazael Olveda Arsenio Loader, MD [facility]  If 7PM-7AM, please contact night-coverage  01/09/2021, 8:24 AM

## 2021-01-10 LAB — BPAM RBC
Blood Product Expiration Date: 202206072359
Blood Product Expiration Date: 202206072359
Blood Product Expiration Date: 202206072359
Blood Product Expiration Date: 202206072359
Blood Product Expiration Date: 202206082359
Blood Product Expiration Date: 202206082359
ISSUE DATE / TIME: 202205111948
ISSUE DATE / TIME: 202205120224
ISSUE DATE / TIME: 202205121040
ISSUE DATE / TIME: 202205122154
Unit Type and Rh: 5100
Unit Type and Rh: 5100
Unit Type and Rh: 5100
Unit Type and Rh: 5100
Unit Type and Rh: 5100
Unit Type and Rh: 5100

## 2021-01-10 LAB — CBC
HCT: 25.8 % — ABNORMAL LOW (ref 39.0–52.0)
Hemoglobin: 7.4 g/dL — ABNORMAL LOW (ref 13.0–17.0)
MCH: 23.3 pg — ABNORMAL LOW (ref 26.0–34.0)
MCHC: 28.7 g/dL — ABNORMAL LOW (ref 30.0–36.0)
MCV: 81.4 fL (ref 80.0–100.0)
Platelets: 111 10*3/uL — ABNORMAL LOW (ref 150–400)
RBC: 3.17 MIL/uL — ABNORMAL LOW (ref 4.22–5.81)
RDW: 21 % — ABNORMAL HIGH (ref 11.5–15.5)
WBC: 3.2 10*3/uL — ABNORMAL LOW (ref 4.0–10.5)
nRBC: 0 % (ref 0.0–0.2)

## 2021-01-10 LAB — TYPE AND SCREEN
ABO/RH(D): O POS
Antibody Screen: NEGATIVE
Unit division: 0
Unit division: 0
Unit division: 0
Unit division: 0
Unit division: 0
Unit division: 0

## 2021-01-10 LAB — GLUCOSE, CAPILLARY
Glucose-Capillary: 118 mg/dL — ABNORMAL HIGH (ref 70–99)
Glucose-Capillary: 107 mg/dL — ABNORMAL HIGH (ref 70–99)
Glucose-Capillary: 179 mg/dL — ABNORMAL HIGH (ref 70–99)
Glucose-Capillary: 170 mg/dL — ABNORMAL HIGH (ref 70–99)
Glucose-Capillary: 151 mg/dL — ABNORMAL HIGH (ref 70–99)

## 2021-01-10 LAB — BASIC METABOLIC PANEL
Anion gap: 4 — ABNORMAL LOW (ref 5–15)
BUN: 23 mg/dL (ref 8–23)
CO2: 25 mmol/L (ref 22–32)
Calcium: 8.4 mg/dL — ABNORMAL LOW (ref 8.9–10.3)
Chloride: 108 mmol/L (ref 98–111)
Creatinine, Ser: 1.4 mg/dL — ABNORMAL HIGH (ref 0.61–1.24)
GFR, Estimated: 53 mL/min — ABNORMAL LOW (ref >60)
Glucose, Bld: 116 mg/dL — ABNORMAL HIGH (ref 70–99)
Potassium: 4.3 mmol/L (ref 3.5–5.1)
Sodium: 137 mmol/L (ref 135–145)

## 2021-01-10 LAB — MAGNESIUM: Magnesium: 2.3 mg/dL (ref 1.7–2.4)

## 2021-01-10 MED ORDER — FUROSEMIDE 10 MG/ML IJ SOLN
20.0000 mg | Freq: Once | INTRAMUSCULAR | Status: AC
  Administered 2021-01-10: 20 mg via INTRAVENOUS
  Filled 2021-01-10: qty 2

## 2021-01-10 NOTE — Progress Notes (Signed)
Pt tx to [facility] via [facility]. Condition stable. Son aware and at bedise time of transport

## 2021-01-10 NOTE — Progress Notes (Signed)
PROGRESS NOTE    [name]  QTM:[id number] DOB: [birth year] DOA: 01/06/2021 PCP: [name], MD   Brief Narrative:  75 year old with history of hepatic cirrhosis, portal hypertension/esophageal varices, recurrent anemia, HCC status posttreatment 06/2020, CAD status post CABG, diastolic CHF EF 62%, CKD stage IIIa, asthma, DM2, HTN, HLD presents with shortness of breath and dizziness with complains of dark stools.  Patient was found to have symptomatic anemia with hemoglobin of 4.8.  Patient received multiple PRBC transfusion during hospitalization.  Endoscopy on 5/13 revealed esophageal varices, acute gastritis, type I gastric varices and portal gastropathy. CTA was done and reviewed by IT.  IR recommend tx to [facility] for BRTO/TIPS   Assessment & Plan:   Principal Problem:   Symptomatic anemia Active Problems:   Type 2 diabetes with complication ([place])   Coronary artery disease involving native coronary artery of native heart without angina pectoris   Hyperlipidemia with target LDL less than 70   Hepatic cirrhosis (HCC)   CKD (chronic kidney disease), stage III (HCC)   (HFpEF) heart failure with preserved ejection fraction (HCC)    Recurrent symptomatic anemia, recurrent GI bleeding Hepatic cirrhosis with portal hypertension and esophageal varices: History of hepatocellular carcinoma status posttreatment 06/2020 -Admission hemoglobin 4.8, status post total 4 units PRBC transfusion.  Hemoglobin today 7.4 - PPI twice daily -Endoscopy on 5/13 revealed esophageal varices, acute gastritis, type I gastric varices and portal gastropathy. - Endoscopy/colonoscopy and capsule endoscopy in March 2022- unrevealing for any definitive causes -IR consulted by GI, CT angio performed and they recommended transferring patient to [facility] for BRTO/TIPS.  I spoke with Dr. Dwaine Gale today morning- planned sometime early in upcoming week.    Type 2 diabetes: With recurrent hypoglycemic  episode Peripheral neuropathy -Last A1c 5.3% 08/10/2020.    Hold Amaryl and metformin - Blood glucose appears to be more stable now - Insulin sliding scale and Accu-Chek -Resume gabapentin   Chronic HFpEF: EF 56%, grade 3 diastolic dysfunction -resume torsemide and Aldactone  CKD stage IIIa: -Creatinine stable around 1.33.  Baseline around 1.2  CAD s/p CABG: -Currently patient denies any chest pain  Hypertension: -Home medications on hold-Aldactone  Asthma: -Continue Breo and a follow-up lbuterol as needed  Hyperlipidemia: -Lipitor   DVT prophylaxis: SCDs Code Status: Full code Family Communication:  [name] updated.  Status is: Inpatient  Remains inpatient appropriate because:Inpatient level of care appropriate due to severity of illness   Dispo: The patient is from: Home              Anticipated d/c is to: Home              Patient currently is not medically stable to d/c.  Status post endoscopypy undergoing IR eval for BRTO vs TIPS   Difficult to place patient No   Subjective: Feels ok, no complaints.   Review of Systems Otherwise negative except as per HPI, including: General = no fevers, chills, dizziness,  fatigue HEENT/EYES = negative for loss of vision, double vision, blurred vision,  sore throa Cardiovascular= negative for chest pain, palpitation Respiratory/lungs= negative for shortness of breath, cough, wheezing; hemoptysis,  Gastrointestinal= negative for nausea, vomiting, abdominal pain Genitourinary= negative for Dysuria MSK = Negative for arthralgia, myalgias Neurology= Negative for headache, numbness, tingling  Psychiatry= Negative for suicidal and homocidal ideation Skin= Negative for Rash   Examination: Constitutional: Not in acute distress Respiratory: Clear to auscultation bilaterally Cardiovascular: Normal sinus rhythm, no rubs Abdomen: Nontender nondistended good bowel sounds  Musculoskeletal: No edema noted Skin: No rashes  seen Neurologic: CN 2-12 grossly intact.  And nonfocal Psychiatric: Normal judgment and insight. Alert and oriented x 3. Normal mood.     Objective: Vitals:   01/09/21 1610 01/09/21 1946 01/10/21 0356 01/10/21 0429  BP: (!) 135/59 (!) 115/52 (!) 126/96   Pulse: (!) 57 (!) 57 62   Resp: 14 18 18    Temp: 98.5 F (36.9 C) 97.8 F (36.6 C) 98.4 F (36.9 C)   TempSrc: Oral Oral Oral   SpO2: 98% 98% 97%   Weight:    113 kg  Height:        Intake/Output Summary (Last 24 hours) at 01/10/2021 1054 Last data filed at 01/10/2021 0826 Gross per 24 hour  Intake 2310.57 ml  Output 2500 ml  Net -189.43 ml   Filed Weights   01/08/21 0933 01/09/21 0600 01/10/21 0429  Weight: 112.1 kg 114.3 kg 113 kg     Data Reviewed:   CBC: Recent Labs  Lab 01/06/21 1630 01/07/21 0725 01/07/21 1753 01/08/21 0346 01/09/21 0508 01/10/21 0433  WBC 3.4* 3.2*  --  3.5* 3.1* 3.2*  HGB 4.8* 6.2* 6.8* 7.6* 7.4* 7.4*  HCT 18.5* 22.0* 23.7* 25.7* 25.8* 25.8*  MCV 77.4* 77.5*  --  78.8* 81.1 81.4  PLT 133* 131*  --  136* 120* 673*   Basic Metabolic Panel: Recent Labs  Lab 01/06/21 1630 01/07/21 0725 01/08/21 0346 01/09/21 0508 01/10/21 0433  NA 136 135 135 132* 137  K 3.6 3.5 4.1 4.3 4.3  CL 104 104 104 103 108  CO2 24 24 25  21* 25  GLUCOSE 73 70 115* 137* 116*  BUN 34* 32* 25* 22 23  CREATININE 1.33* 1.20 1.21 1.01 1.40*  CALCIUM 8.5* 8.6* 8.4* 8.3* 8.4*  MG  --  2.2  --  2.2 2.3   GFR: Estimated Creatinine Clearance: 56.4 mL/min (A) (by C-G formula based on SCr of 1.4 mg/dL (H)). Liver Function Tests: Recent Labs  Lab 01/06/21 1630 01/07/21 1753  AST 19 18  ALT 15 14  ALKPHOS 96 101  BILITOT 0.7 1.9*  PROT 7.1 6.7  ALBUMIN 3.7 3.6   No results for input(s): LIPASE, AMYLASE in the last 168 hours. No results for input(s): AMMONIA in the last 168 hours. Coagulation Profile: Recent Labs  Lab 01/07/21 1753  INR 1.2   Cardiac Enzymes: No results for input(s): CKTOTAL, CKMB,  CKMBINDEX, TROPONINI in the last 168 hours. BNP (last 3 results) No results for input(s): PROBNP in the last 8760 hours. HbA1C: Recent Labs    01/07/21 1753  HGBA1C 5.4   CBG: Recent Labs  Lab 01/09/21 1650 01/09/21 1944 01/09/21 2355 01/10/21 0357 01/10/21 0735  GLUCAP 133* 178* 144* 118* 107*   Lipid Profile: No results for input(s): CHOL, HDL, LDLCALC, TRIG, CHOLHDL, LDLDIRECT in the last 72 hours. Thyroid Function Tests: No results for input(s): TSH, T4TOTAL, FREET4, T3FREE, THYROIDAB in the last 72 hours. Anemia Panel: No results for input(s): VITAMINB12, FOLATE, FERRITIN, TIBC, IRON, RETICCTPCT in the last 72 hours. Sepsis Labs: No results for input(s): PROCALCITON, LATICACIDVEN in the last 168 hours.  Recent Results (from the past 240 hour(s))  Resp Panel by RT-PCR (Flu A&B, Covid) Nasopharyngeal Swab     Status: None   Collection Time: 01/06/21  4:25 PM   Specimen: Nasopharyngeal Swab; Nasopharyngeal(NP) swabs in vial transport medium  Result Value Ref Range Status   SARS Coronavirus 2 by RT PCR NEGATIVE NEGATIVE Final  Comment: (NOTE) SARS-CoV-2 target nucleic acids are NOT DETECTED.  The SARS-CoV-2 RNA is generally detectable in upper respiratory specimens during the acute phase of infection. The lowest concentration of SARS-CoV-2 viral copies this assay can detect is 138 copies/mL. A negative result does not preclude SARS-Cov-2 infection and should not be used as the sole basis for treatment or other patient management decisions. A negative result may occur with  improper specimen collection/handling, submission of specimen other than nasopharyngeal swab, presence of viral mutation(s) within the areas targeted by this assay, and inadequate number of viral copies(<138 copies/mL). A negative result must be combined with clinical observations, patient history, and epidemiological information. The expected result is Negative.  Fact Sheet for Patients:   EntrepreneurPulse.com.au  Fact Sheet for Healthcare Providers:  IncredibleEmployment.be  This test is no t yet approved or cleared by the [place] FDA and  has been authorized for detection and/or diagnosis of SARS-CoV-2 by FDA under an Emergency Use Authorization (EUA). This EUA will remain  in effect (meaning this test can be used) for the duration of the COVID-19 declaration under Section 564(b)(1) of the Act, 21 U.S.C.section 360bbb-3(b)(1), unless the authorization is terminated  or revoked sooner.       Influenza A by PCR NEGATIVE NEGATIVE Final   Influenza B by PCR NEGATIVE NEGATIVE Final    Comment: (NOTE) The Xpert Xpress SARS-CoV-2/FLU/RSV plus assay is intended as an aid in the diagnosis of influenza from Nasopharyngeal swab specimens and should not be used as a sole basis for treatment. Nasal washings and aspirates are unacceptable for Xpert Xpress SARS-CoV-2/FLU/RSV testing.  Fact Sheet for Patients: EntrepreneurPulse.com.au  Fact Sheet for Healthcare Providers: IncredibleEmployment.be  This test is not yet approved or cleared by the [place] FDA and has been authorized for detection and/or diagnosis of SARS-CoV-2 by FDA under an Emergency Use Authorization (EUA). This EUA will remain in effect (meaning this test can be used) for the duration of the COVID-19 declaration under Section 564(b)(1) of the Act, 21 U.S.C. section 360bbb-3(b)(1), unless the authorization is terminated or revoked.  Performed at [facility], [place] [address] [place], [place] [address]          Radiology Studies: CT Angio Abd/Pel w/ and/or w/o  Result Date: 01/08/2021 CLINICAL DATA:  75 year old male with history of cirrhosis, hepatocellular carcinoma status post radiation segmentectomy segment 8 in November 2021, and gastroesophageal varices presenting with anemia and possible  gastrointestinal hemorrhage. EXAM: CTA ABDOMEN AND PELVIS WITH CONTRAST TECHNIQUE: Multidetector CT imaging of the abdomen and pelvis was performed using the standard protocol before, during, and after bolus administration of intravenous contrast. Multiplanar reconstructed images and MIPs were obtained and reviewed to evaluate the vascular anatomy. CONTRAST:  147mL OMNIPAQUE IOHEXOL 350 MG/ML SOLN COMPARISON:  04/20/2020 FINDINGS: VASCULAR Aorta: Normal caliber aorta without aneurysm, dissection, vasculitis or significant stenosis. Circumferential atherosclerotic calcifications. Celiac: Patent without evidence of aneurysm, dissection, vasculitis or significant stenosis. SMA: Patent without evidence of aneurysm, dissection, vasculitis or significant stenosis. Renals: Severe stenosis of the single right renal ostia secondary prominent atherosclerotic plaque. The dual left renal arteries are patent. IMA: Patent without evidence of aneurysm, dissection, vasculitis or significant stenosis. Inflow: Patent without evidence of aneurysm, dissection, vasculitis or significant stenosis. Proximal Outflow: Bilateral common femoral and visualized portions of the superficial and profunda femoral arteries are patent without evidence of aneurysm, dissection, vasculitis or significant stenosis. Veins: The hepatic veins appear patent. The portal system appears widely patent. Gastroesophageal varices arise from  the splenic vein. No evidence of portosystemic shunt. The renal veins are widely patent. No evidence of iliocaval anomaly or thrombus. Review of the MIP images confirms the above findings. NON-VASCULAR Lower chest: Small right and trace left pleural effusions with associated passive bibasilar atelectasis. Mild diffuse ground-glass pulmonary opacities and interlobular septal thickening. Mild global cardiomegaly. No pericardial effusion. Severe coronary atherosclerotic calcifications. Partially visualized calcified mediastinal and  hilar lymph nodes. Hepatobiliary: Shrunken and nodular contour of the liver compatible with history of cirrhosis. Ill-defined hypodensity in segment 8 measuring approximately 3.5 cm without appreciable enhancement on arterial or delayed venous phases. No new hepatomas. The gallbladder is present unremarkable. No intra or extrahepatic biliary ductal dilation. Pancreas: Unremarkable. No pancreatic ductal dilatation or surrounding inflammatory changes. Spleen: Upper limits of normal size measuring up to 13 mm in craniocaudal dimension. Scattered punctate calcifications. No mass. Adrenals/Urinary Tract: Adrenal glands are unremarkable. Unchanged diffuse atrophy of the right kidney with similar appearing upper pole nonobstructive renal calculus measuring up to 14 mm. The left kidney is normal. No hydronephrosis. The bladder is distended without wall thickening. Bladder is unremarkable. Stomach/Bowel: Multiple prominent and patent distal esophageal varices are visualized. Multiple prominent varices about the gastric cardia. Stomach is otherwise within normal limits. Appendix is surgically absent. No evidence of bowel wall thickening, distention, or inflammatory changes. No evidence of intraluminal hemorrhage. Lymphatic: Prominent bilateral inguinal lymph nodes with preserved fatty hila, likely reactive. No abdominopelvic lymphadenopathy. Reproductive: Prostate is unremarkable. Other: Trace ascites.  Diffuse body wall anasarca. Musculoskeletal: No acute or significant osseous findings. IMPRESSION: VASCULAR 1. No evidence of gastrointestinal hemorrhage, as queried. 2. Prominent esophageal and gastric varices. No evidence of renal portosystemic shunt. 3. Similar appearing severe focal stenosis about the right renal artery ostium with associated chronic right renal atrophy. NON-VASCULAR 1. Similar appearing morphologic changes of cirrhosis with secondary signs of portal hypertension including trace ascites, gastroesophageal  varices, small right trace left pleural effusions, mild pulmonary edema, and body wall anasarca. With similar appearing cardiomegaly, there is likely partial cardiogenic etiology to fluid overload status. 2. Postprocedural changes after segment 8 hepatic radiation segmentectomy without evidence of persistent enhancing mass. 3. Evidence of prior granulomatous disease. 4. Nonobstructive right upper pole nephrolithiasis, similar to comparison. Ruthann Cancer, MD [facility] Radiology Electronically Signed   By: Ruthann Cancer MD   On: 01/08/2021 16:19        Scheduled Meds: . sodium chloride   Intravenous Once  . atorvastatin  40 mg Oral QHS  . gabapentin  100 mg Oral BID  . pantoprazole (PROTONIX) IV  40 mg Intravenous Q12H  . sodium chloride flush  3 mL Intravenous Q12H  . spironolactone  25 mg Oral Daily  . torsemide  40 mg Oral BID  . umeclidinium-vilanterol  1 puff Inhalation Daily   Continuous Infusions: . sodium chloride Stopped (01/06/21 1728)  . cefTRIAXone (ROCEPHIN)  IV 2 g (01/10/21 1013)  . octreotide  (SANDOSTATIN)    IV infusion 50 mcg/hr (01/10/21 0050)     LOS: 4 days   Time spent= 35 mins    Lariza Cothron Arsenio Loader, MD [facility]  If 7PM-7AM, please contact night-coverage  01/10/2021, 10:54 AM

## 2021-01-10 NOTE — Progress Notes (Signed)
Report called to sara on [place] @ [facility].

## 2021-01-11 ENCOUNTER — Encounter (HOSPITAL_COMMUNITY): Payer: Self-pay | Admitting: Gastroenterology

## 2021-01-11 DIAGNOSIS — K7031 Alcoholic cirrhosis of liver with ascites: Secondary | ICD-10-CM

## 2021-01-11 DIAGNOSIS — N1831 Chronic kidney disease, stage 3a: Secondary | ICD-10-CM

## 2021-01-11 DIAGNOSIS — I509 Heart failure, unspecified: Secondary | ICD-10-CM

## 2021-01-11 DIAGNOSIS — I5032 Chronic diastolic (congestive) heart failure: Secondary | ICD-10-CM

## 2021-01-11 DIAGNOSIS — E118 Type 2 diabetes mellitus with unspecified complications: Secondary | ICD-10-CM

## 2021-01-11 DIAGNOSIS — E785 Hyperlipidemia, unspecified: Secondary | ICD-10-CM

## 2021-01-11 LAB — GLUCOSE, CAPILLARY
Glucose-Capillary: 108 mg/dL — ABNORMAL HIGH (ref 70–99)
Glucose-Capillary: 110 mg/dL — ABNORMAL HIGH (ref 70–99)
Glucose-Capillary: 143 mg/dL — ABNORMAL HIGH (ref 70–99)
Glucose-Capillary: 189 mg/dL — ABNORMAL HIGH (ref 70–99)
Glucose-Capillary: 99 mg/dL (ref 70–99)

## 2021-01-11 LAB — CBC
HCT: 22.9 % — ABNORMAL LOW (ref 39.0–52.0)
Hemoglobin: 6.7 g/dL — CL (ref 13.0–17.0)
MCH: 23.4 pg — ABNORMAL LOW (ref 26.0–34.0)
MCHC: 29.3 g/dL — ABNORMAL LOW (ref 30.0–36.0)
MCV: 80.1 fL (ref 80.0–100.0)
Platelets: 97 10*3/uL — ABNORMAL LOW (ref 150–400)
RBC: 2.86 MIL/uL — ABNORMAL LOW (ref 4.22–5.81)
RDW: 21.1 % — ABNORMAL HIGH (ref 11.5–15.5)
WBC: 2.8 10*3/uL — ABNORMAL LOW (ref 4.0–10.5)
nRBC: 0 % (ref 0.0–0.2)

## 2021-01-11 LAB — BASIC METABOLIC PANEL
Anion gap: 5 (ref 5–15)
BUN: 24 mg/dL — ABNORMAL HIGH (ref 8–23)
CO2: 27 mmol/L (ref 22–32)
Calcium: 7.9 mg/dL — ABNORMAL LOW (ref 8.9–10.3)
Chloride: 104 mmol/L (ref 98–111)
Creatinine, Ser: 1.54 mg/dL — ABNORMAL HIGH (ref 0.61–1.24)
GFR, Estimated: 47 mL/min — ABNORMAL LOW (ref >60)
Glucose, Bld: 101 mg/dL — ABNORMAL HIGH (ref 70–99)
Potassium: 4 mmol/L (ref 3.5–5.1)
Sodium: 136 mmol/L (ref 135–145)

## 2021-01-11 LAB — PREPARE RBC (CROSSMATCH)

## 2021-01-11 LAB — MAGNESIUM: Magnesium: 2 mg/dL (ref 1.7–2.4)

## 2021-01-11 LAB — SURGICAL PATHOLOGY

## 2021-01-11 MED ORDER — SODIUM CHLORIDE 0.9% IV SOLUTION: Freq: Once | INTRAVENOUS | Status: AC

## 2021-01-11 MED ORDER — BISACODYL 10 MG RE SUPP
10.0000 mg | Freq: Once | RECTAL | Status: AC
  Administered 2021-01-11: 10 mg via RECTAL
  Filled 2021-01-11: qty 1

## 2021-01-11 MED ORDER — INSULIN ASPART 100 UNIT/ML IJ SOLN
0.0000 [IU] | Freq: Three times a day (TID) | INTRAMUSCULAR | Status: DC
  Administered 2021-01-12 – 2021-01-15 (×6): 1 [IU] via SUBCUTANEOUS
  Administered 2021-01-16: 2 [IU] via SUBCUTANEOUS
  Administered 2021-01-16 – 2021-01-17 (×3): 1 [IU] via SUBCUTANEOUS

## 2021-01-11 NOTE — Care Management Important Message (Signed)
Important Message  Patient Details  Name: UDAY JANTZ MRN: [id number] Date of Birth: 06-08-1946   Medicare Important Message Given:  Yes     [name] 01/11/2021, 9:29 AM

## 2021-01-11 NOTE — Plan of Care (Signed)

## 2021-01-11 NOTE — Progress Notes (Signed)
Lab notified of critical Hgb of 6.7. MD notified. States to have day shift RN notify the day team to see if they want to transfuse.

## 2021-01-11 NOTE — Progress Notes (Addendum)
Supervising Physician: Markus Daft  Patient Status: [facility] - In-pt  Subjective: IR follow up for TIPS planning. CT reviewed, aiming to proceed with TIPS procedure for Wednesday 5/18. Do not feel BRTO warranted at this time. Hgb dropped to 6.7 today, 1 unit PRBC planned  Objective: Physical Exam: BP (!) 118/59 (BP Location: Right Arm)   Pulse (!) 55   Temp 97.8 F (36.6 C) (Oral)   Resp 18   Ht 5\' 8"  (1.727 m)   Wt 109.6 kg Comment: scale a  SpO2 100%   BMI 36.75 kg/m     Current Facility-Administered Medications:  .  0.9 %  sodium chloride infusion (Manually program via Guardrails IV Fluids), , Intravenous, Once, Wilford Corner, MD .  0.9 %  sodium chloride infusion (Manually program via Guardrails IV Fluids), , Intravenous, Once, [place], Gagan S, MD .  0.9 %  sodium chloride infusion, 10 mL/hr, Intravenous, Once, Wilford Corner, MD, Held at 01/06/21 1728 .  acetaminophen (TYLENOL) tablet 650 mg, 650 mg, Oral, Q6H PRN **OR** acetaminophen (TYLENOL) suppository 650 mg, 650 mg, Rectal, Q6H PRN, Wilford Corner, MD .  albuterol (VENTOLIN HFA) 108 (90 Base) MCG/ACT inhaler 2 puff, 2 puff, Inhalation, Q6H PRN, Wilford Corner, MD .  atorvastatin (LIPITOR) tablet 40 mg, 40 mg, Oral, QHS, Wilford Corner, MD, 40 mg at 01/10/21 2156 .  cefTRIAXone (ROCEPHIN) 2 g in sodium chloride 0.9 % 100 mL IVPB, 2 g, Intravenous, Q24H, Wilford Corner, MD, Last Rate: 200 mL/hr at 01/11/21 1500, Infusion Verify at 01/11/21 1500 .  gabapentin (NEURONTIN) capsule 100 mg, 100 mg, Oral, BID, Amin, Ankit Chirag, MD, 100 mg at 01/11/21 1010 .  insulin aspart (novoLOG) injection 0-6 Units, 0-6 Units, Subcutaneous, TID WC, Lama, Gagan S, MD .  ipratropium-albuterol (DUONEB) 0.5-2.5 (3) MG/3ML nebulizer solution 3 mL, 3 mL, Nebulization, Q4H PRN, Amin, Ankit Chirag, MD .  octreotide (SANDOSTATIN) 500 mcg in sodium chloride 0.9 % 250 mL (2 mcg/mL) infusion, 50 mcg/hr, Intravenous, Continuous,  Wilford Corner, MD, Last Rate: 25 mL/hr at 01/11/21 1500, 50 mcg/hr at 01/11/21 1500 .  ondansetron (ZOFRAN) tablet 4 mg, 4 mg, Oral, Q6H PRN **OR** ondansetron (ZOFRAN) injection 4 mg, 4 mg, Intravenous, Q6H PRN, Wilford Corner, MD .  pantoprazole (PROTONIX) injection 40 mg, 40 mg, Intravenous, Q12H, Wilford Corner, MD, 40 mg at 01/11/21 1011 .  sodium chloride flush (NS) 0.9 % injection 3 mL, 3 mL, Intravenous, Q12H, Wilford Corner, MD, 3 mL at 01/11/21 1010 .  spironolactone (ALDACTONE) tablet 25 mg, 25 mg, Oral, Daily, Amin, Ankit Chirag, MD, 25 mg at 01/11/21 1012 .  torsemide (DEMADEX) tablet 40 mg, 40 mg, Oral, BID, Amin, Ankit Chirag, MD, 40 mg at 01/11/21 0839 .  umeclidinium-vilanterol (ANORO ELLIPTA) 62.5-25 MCG/INH 1 puff, 1 puff, Inhalation, Daily, Wilford Corner, MD, 1 puff at 01/11/21 0757  Labs: CBC Recent Labs    01/10/21 0433 01/11/21 0442  WBC 3.2* 2.8*  HGB 7.4* 6.7*  HCT 25.8* 22.9*  PLT 111* 97*   BMET Recent Labs    01/10/21 0433 01/11/21 0442  NA 137 136  K 4.3 4.0  CL 108 104  CO2 25 27  GLUCOSE 116* 101*  BUN 23 24*  CREATININE 1.40* 1.54*  CALCIUM 8.4* 7.9*    Studies/Results: No results found.  Assessment/Plan: Gastric varices. Hemodynamically stable though hgb cont to drift. Imaging reviewed. NO evidence of renal portosystemic shunt. Feel TIPS procedure would be beneficial. Planning for Wednesday 5/18 under general anesthesia. Mild  cardiomegaly noted on CT, will check 2D Echo to assess right heart function prior to procedure. Last echo was 9/21 Following labs closely. Will re-discuss with pt and obtain consent prior to procedure.     LOS: 5 days   I spent a total of 20 minutes in face to face in clinical consultation, greater than 50% of which was counseling/coordinating care for TIPS  Ascencion Dike PA-C 01/11/2021 3:46 PM

## 2021-01-11 NOTE — Progress Notes (Signed)
Notified Dr. Darrick Meigs via secure chat that patient is asking for something for constipation and that patient's hgb today is 6.7, MD ordered suppository and 1 unit of PRBC.

## 2021-01-11 NOTE — Progress Notes (Signed)
[facility]  PROGRESS NOTE  [name] BJS:[id number] DOB: February 11, 1946 DOA: 01/06/2021 PCP: [name], MD   Brief HPI:   75 year old male with medical history of hepatic cirrhosis, portal hypertension, esophageal varices, recurrent anemia, hepatocellular carcinoma status posttreatment in November 2021, CAD s/p CABG, diastolic CHF, EF 60%, CKD stage IIIa, asthma, diabetes mellitus type 2, hypertension, hyperlipidemia presented with shortness of breath and dizziness with complaints of dark stools.  Patient was found to have symptomatic anemia with hemoglobin of 4.8.  He received multiple units of PRBC during this hospitalization.  EGD on 5/13 revealed esophageal varices, acute gastritis, type I gastric varices and portal gastropathy.  IR recommended B RTO/TIPS.  Patient was transferred to [facility] for the procedure.    Subjective   Patient seen and examined, complains of constipation this morning.  Hemoglobin is 6.7 today.  No vomiting of blood.  No blood in stool noted.   Assessment/Plan:     1. Recurrent GI bleeding-patient has diverticulosis with portal hypertension and esophageal varices.  On admission hemoglobin was 4.8, s/p 4 units PRBC.  Hemoglobin is 6.7 this morning.  Started on Protonix 40 mg IV twice daily.  Endoscopy on 5/13 showed esophageal varices, acute gastritis, type I gastric varices and portal gastropathy.  Endoscopy/colonoscopy and capsule endoscopy in March 2002 was unrevealing for any definite causes.  Consulted by GI.  CT AAA abdomen pelvis performed and recommended transferring patient to [facility] for BRTO/TIPS.  2. Anemia-due to GI bleed as above.  Hemoglobin is 6.7 today.  Will give 1 unit of PRBC.  Follow CBC in a.m. 3. Diabetes mellitus type 2-hemoglobin A1c is 5.3, Amaryl and metformin on hold.  CBG well controlled.  Continue with supersensitive sliding scale insulin with NovoLog. 4. Chronic HFpEF-echocardiogram showed EF 73%, grade 3 diastolic  dysfunction.  Continue torsemide, Aldactone. 5. Hypertension-continue spironolactone, blood pressure is stable. 6. CKD stage IIIa-creatinine stable at 1.33.  Patient baseline is around 1.2. 7. CAD s/p CABG-stable.  No chest pain. 8. Asthma-continue Breo 9. Hyperlipidemia-continue Lipitor   Scheduled medications:   . sodium chloride   Intravenous Once  . sodium chloride   Intravenous Once  . atorvastatin  40 mg Oral QHS  . bisacodyl  10 mg Rectal Once  . gabapentin  100 mg Oral BID  . pantoprazole (PROTONIX) IV  40 mg Intravenous Q12H  . sodium chloride flush  3 mL Intravenous Q12H  . spironolactone  25 mg Oral Daily  . torsemide  40 mg Oral BID  . umeclidinium-vilanterol  1 puff Inhalation Daily         Data Reviewed:   CBG:  Recent Labs  Lab 01/10/21 1603 01/10/21 2001 01/11/21 0006 01/11/21 0419 01/11/21 1118  GLUCAP 170* 151* 110* 99 189*    SpO2: 100 % O2 Flow Rate (L/min): 2 L/min    Vitals:   01/11/21 0758 01/11/21 0834 01/11/21 1011 01/11/21 1117  BP:  (!) 115/55 118/60 (!) 118/59  Pulse:  64 60 (!) 55  Resp:  17  18  Temp:  97.6 F (36.4 C)  97.8 F (36.6 C)  TempSrc:  Oral  Oral  SpO2: 97% 99%  100%  Weight:      Height:         Intake/Output Summary (Last 24 hours) at 01/11/2021 1359 Last data filed at 01/11/2021 1300 Gross per 24 hour  Intake 860.8 ml  Output 6150 ml  Net -5289.2 ml    05/14 1901 - 05/16  0700 In: 2964.3 [P.O.:1560; I.V.:1204.3] Out: 7700 [Urine:7700]  Filed Weights   01/10/21 0429 01/10/21 1700 01/11/21 0207  Weight: 113 kg 111.4 kg 109.6 kg    CBC:  Recent Labs  Lab 01/07/21 0725 01/07/21 1753 01/08/21 0346 01/09/21 0508 01/10/21 0433 01/11/21 0442  WBC 3.2*  --  3.5* 3.1* 3.2* 2.8*  HGB 6.2* 6.8* 7.6* 7.4* 7.4* 6.7*  HCT 22.0* 23.7* 25.7* 25.8* 25.8* 22.9*  PLT 131*  --  136* 120* 111* 97*  MCV 77.5*  --  78.8* 81.1 81.4 80.1  MCH 21.8*  --  23.3* 23.3* 23.3* 23.4*  MCHC 28.2*  --  29.6* 28.7*  28.7* 29.3*  RDW 19.6*  --  19.9* 20.4* 21.0* 21.1*    Complete metabolic panel:  Recent Labs  Lab 01/06/21 1630 01/07/21 0725 01/07/21 1753 01/08/21 0346 01/09/21 0508 01/10/21 0433 01/11/21 0442  NA 136 135  --  135 132* 137 136  K 3.6 3.5  --  4.1 4.3 4.3 4.0  CL 104 104  --  104 103 108 104  CO2 24 24  --  25 21* 25 27  GLUCOSE 73 70  --  115* 137* 116* 101*  BUN 34* 32*  --  25* 22 23 24*  CREATININE 1.33* 1.20  --  1.21 1.01 1.40* 1.54*  CALCIUM 8.5* 8.6*  --  8.4* 8.3* 8.4* 7.9*  AST 19  --  18  --   --   --   --   ALT 15  --  14  --   --   --   --   ALKPHOS 96  --  101  --   --   --   --   BILITOT 0.7  --  1.9*  --   --   --   --   ALBUMIN 3.7  --  3.6  --   --   --   --   MG  --  2.2  --   --  2.2 2.3 2.0  INR  --   --  1.2  --   --   --   --   HGBA1C  --   --  5.4  --   --   --   --   BNP 337.7*  --   --   --   --   --   --     No results for input(s): LIPASE, AMYLASE in the last 168 hours.  Recent Labs  Lab 01/06/21 1625 01/06/21 1630  BNP  --  337.7*  SARSCOV2NAA NEGATIVE  --     ------------------------------------------------------------------------------------------------------------------ No results for input(s): CHOL, HDL, LDLCALC, TRIG, CHOLHDL, LDLDIRECT in the last 72 hours.  Lab Results  Component Value Date   HGBA1C 5.4 01/07/2021   ------------------------------------------------------------------------------------------------------------------ No results for input(s): TSH, T4TOTAL, T3FREE, THYROIDAB in the last 72 hours.  Invalid input(s): FREET3 ------------------------------------------------------------------------------------------------------------------ No results for input(s): VITAMINB12, FOLATE, FERRITIN, TIBC, IRON, RETICCTPCT in the last 72 hours.  Coagulation profile Recent Labs  Lab 01/07/21 1753  INR 1.2   No results for input(s): DDIMER in the last 72 hours.  Cardiac Enzymes No results for input(s): CKTOTAL, CKMB,  CKMBINDEX, TROPONINI in the last 168 hours.  ------------------------------------------------------------------------------------------------------------------    Component Value Date/Time   BNP 337.7 (H) 01/06/2021 1630     Antibiotics: Anti-infectives (From admission, onward)   Start     Dose/Rate Route Frequency Ordered Stop   01/07/21 1100  cefTRIAXone (ROCEPHIN) 2 g in  sodium chloride 0.9 % 100 mL IVPB        2 g 200 mL/hr over 30 Minutes Intravenous Every 24 hours 01/07/21 1010         Radiology Reports  No results found.    DVT prophylaxis: SCDs  Code Status: Full code  Family Communication: No family at bedside   Consultants:  Gastroenterology  Procedures:  EGD    Objective    Physical Examination:    General-appears in no acute distress  Heart-S1-S2, regular, no murmur auscultated  Lungs-clear to auscultation bilaterally, no wheezing or crackles auscultated  Abdomen-soft, nontender, no organomegaly  Extremities-no edema in the lower extremities  Neuro-alert, oriented x3, no focal deficit noted   Status is: Inpatient  Dispo: The patient is from: Home              Anticipated d/c is to: Home              Anticipated d/c date is: 01/13/2021              Patient currently not stable for discharge  Barrier to discharge-awaiting BRTO/TIPS per IR  COVID-19 Labs  No results for input(s): DDIMER, FERRITIN, LDH, CRP in the last 72 hours.  Lab Results  Component Value Date   SARSCOV2NAA NEGATIVE 01/06/2021   SARSCOV2NAA NEGATIVE 11/18/2020   SARSCOV2NAA POSITIVE (A) 11/18/2020   [place] NEGATIVE 10/08/2020    Microbiology  Recent Results (from the past 240 hour(s))  Resp Panel by RT-PCR (Flu A&B, Covid) Nasopharyngeal Swab     Status: None   Collection Time: 01/06/21  4:25 PM   Specimen: Nasopharyngeal Swab; Nasopharyngeal(NP) swabs in vial transport medium  Result Value Ref Range Status   SARS Coronavirus 2 by RT PCR NEGATIVE  NEGATIVE Final    Comment: (NOTE) SARS-CoV-2 target nucleic acids are NOT DETECTED.  The SARS-CoV-2 RNA is generally detectable in upper respiratory specimens during the acute phase of infection. The lowest concentration of SARS-CoV-2 viral copies this assay can detect is 138 copies/mL. A negative result does not preclude SARS-Cov-2 infection and should not be used as the sole basis for treatment or other patient management decisions. A negative result may occur with  improper specimen collection/handling, submission of specimen other than nasopharyngeal swab, presence of viral mutation(s) within the areas targeted by this assay, and inadequate number of viral copies(<138 copies/mL). A negative result must be combined with clinical observations, patient history, and epidemiological information. The expected result is Negative.  Fact Sheet for Patients:  EntrepreneurPulse.com.au  Fact Sheet for Healthcare Providers:  IncredibleEmployment.be  This test is no t yet approved or cleared by the [place] FDA and  has been authorized for detection and/or diagnosis of SARS-CoV-2 by FDA under an Emergency Use Authorization (EUA). This EUA will remain  in effect (meaning this test can be used) for the duration of the COVID-19 declaration under Section 564(b)(1) of the Act, 21 U.S.C.section 360bbb-3(b)(1), unless the authorization is terminated  or revoked sooner.       Influenza A by PCR NEGATIVE NEGATIVE Final   Influenza B by PCR NEGATIVE NEGATIVE Final    Comment: (NOTE) The Xpert Xpress SARS-CoV-2/FLU/RSV plus assay is intended as an aid in the diagnosis of influenza from Nasopharyngeal swab specimens and should not be used as a sole basis for treatment. Nasal washings and aspirates are unacceptable for Xpert Xpress SARS-CoV-2/FLU/RSV testing.  Fact Sheet for Patients: EntrepreneurPulse.com.au  Fact Sheet for Healthcare  Providers: IncredibleEmployment.be  This test is  not yet approved or cleared by the [place] and has been authorized for detection and/or diagnosis of SARS-CoV-2 by FDA under an Emergency Use Authorization (EUA). This EUA will remain in effect (meaning this test can be used) for the duration of the COVID-19 declaration under Section 564(b)(1) of the Act, 21 U.S.C. section 360bbb-3(b)(1), unless the authorization is terminated or revoked.  Performed at [facility], [place] [address] [place], [place] [address]              Oswald Hillock   [facility] If 7PM-7AM, please contact night-coverage at [email or web address], Office  [phone]   01/11/2021, 1:59 PM  LOS: 5 days

## 2021-01-12 ENCOUNTER — Encounter (HOSPITAL_COMMUNITY): Payer: Self-pay | Admitting: Internal Medicine

## 2021-01-12 ENCOUNTER — Other Ambulatory Visit: Payer: Self-pay | Admitting: Student

## 2021-01-12 ENCOUNTER — Inpatient Hospital Stay (HOSPITAL_COMMUNITY): Payer: Medicare HMO

## 2021-01-12 DIAGNOSIS — I5030 Unspecified diastolic (congestive) heart failure: Secondary | ICD-10-CM

## 2021-01-12 LAB — TYPE AND SCREEN
ABO/RH(D): O POS
Antibody Screen: NEGATIVE
Unit division: 0

## 2021-01-12 LAB — BASIC METABOLIC PANEL
Anion gap: 7 (ref 5–15)
BUN: 27 mg/dL — ABNORMAL HIGH (ref 8–23)
CO2: 27 mmol/L (ref 22–32)
Calcium: 8 mg/dL — ABNORMAL LOW (ref 8.9–10.3)
Chloride: 103 mmol/L (ref 98–111)
Creatinine, Ser: 1.4 mg/dL — ABNORMAL HIGH (ref 0.61–1.24)
GFR, Estimated: 53 mL/min — ABNORMAL LOW (ref >60)
Glucose, Bld: 134 mg/dL — ABNORMAL HIGH (ref 70–99)
Potassium: 3.8 mmol/L (ref 3.5–5.1)
Sodium: 137 mmol/L (ref 135–145)

## 2021-01-12 LAB — GLUCOSE, CAPILLARY
Glucose-Capillary: 100 mg/dL — ABNORMAL HIGH (ref 70–99)
Glucose-Capillary: 188 mg/dL — ABNORMAL HIGH (ref 70–99)
Glucose-Capillary: 160 mg/dL — ABNORMAL HIGH (ref 70–99)
Glucose-Capillary: 107 mg/dL — ABNORMAL HIGH (ref 70–99)

## 2021-01-12 LAB — BPAM RBC
Blood Product Expiration Date: 202206082359
ISSUE DATE / TIME: 202205161806
Unit Type and Rh: 5100

## 2021-01-12 LAB — HEPATIC FUNCTION PANEL
ALT: 12 U/L (ref 0–44)
AST: 18 U/L (ref 15–41)
Albumin: 3.2 g/dL — ABNORMAL LOW (ref 3.5–5.0)
Alkaline Phosphatase: 82 U/L (ref 38–126)
Bilirubin, Direct: 0.2 mg/dL (ref 0.0–0.2)
Indirect Bilirubin: 0.9 mg/dL (ref 0.3–0.9)
Total Bilirubin: 1.1 mg/dL (ref 0.3–1.2)
Total Protein: 5.9 g/dL — ABNORMAL LOW (ref 6.5–8.1)

## 2021-01-12 LAB — ECHOCARDIOGRAM COMPLETE
AR max vel: 1.59 cm2
AV Area VTI: 1.42 cm2
AV Area mean vel: 1.6 cm2
AV Mean grad: 8 mmHg
AV Peak grad: 16.6 mmHg
Ao pk vel: 2.04 m/s
Area-P 1/2: 2.91 cm2
Calc EF: 73 %
Height: 68 in
MV VTI: 1.41 cm2
P 1/2 time: 368 msec
S' Lateral: 2.9 cm
Single Plane A2C EF: 73 %
Single Plane A4C EF: 74.3 %
Weight: 3820.8 oz

## 2021-01-12 LAB — CBC
HCT: 25.9 % — ABNORMAL LOW (ref 39.0–52.0)
Hemoglobin: 7.6 g/dL — ABNORMAL LOW (ref 13.0–17.0)
MCH: 23.7 pg — ABNORMAL LOW (ref 26.0–34.0)
MCHC: 29.3 g/dL — ABNORMAL LOW (ref 30.0–36.0)
MCV: 80.7 fL (ref 80.0–100.0)
Platelets: 102 10*3/uL — ABNORMAL LOW (ref 150–400)
RBC: 3.21 MIL/uL — ABNORMAL LOW (ref 4.22–5.81)
RDW: 20.8 % — ABNORMAL HIGH (ref 11.5–15.5)
WBC: 2.8 10*3/uL — ABNORMAL LOW (ref 4.0–10.5)
nRBC: 0 % (ref 0.0–0.2)

## 2021-01-12 LAB — MAGNESIUM: Magnesium: 2 mg/dL (ref 1.7–2.4)

## 2021-01-12 NOTE — Progress Notes (Signed)
[facility]  PROGRESS NOTE  [name] JSE:[id number] DOB: Apr 05, 1946 DOA: 01/06/2021 PCP: [name], MD   Brief HPI:   75 year old male with medical history of hepatic cirrhosis, portal hypertension, esophageal varices, recurrent anemia, hepatocellular carcinoma status posttreatment in November 2021, CAD s/p CABG, diastolic CHF, EF 07%, CKD stage IIIa, asthma, diabetes mellitus type 2, hypertension, hyperlipidemia presented with shortness of breath and dizziness with complaints of dark stools.  Patient was found to have symptomatic anemia with hemoglobin of 4.8.  He received multiple units of PRBC during this hospitalization.  EGD on 5/13 revealed esophageal varices, acute gastritis, type I gastric varices and portal gastropathy.  IR recommended B RTO/TIPS.  Patient was transferred to [facility] for the procedure.    Subjective   Patient seen and examined, s/p 1 unit PRBC yesterday.  Hemoglobin this morning is 7.6.  Plan for TIPS procedure tomorrow as per IR.   Assessment/Plan:     1. Recurrent GI bleeding-patient has diverticulosis with portal hypertension and esophageal varices.  On admission hemoglobin was 4.8, s/p 4 units PRBC.  Hemoglobin is 6.7 this morning.  Started on Protonix 40 mg IV twice daily.  Endoscopy on 5/13 showed esophageal varices, acute gastritis, type I gastric varices and portal gastropathy.  Endoscopy/colonoscopy and capsule endoscopy in March 2002 was unrevealing for any definite causes.  Consulted by GI.  CT AAA abdomen pelvis performed and recommended transferring patient to [facility] for BRTO/TIPS.  IR has seen the patient and plan to do TIPS procedure in a.m. 2. Anemia-due to GI bleed as above.  Hemoglobin was 6.7 yesterday, s/p 1 unit PRBC.  This morning hemoglobin has improved to 7.6.  No signs of bleeding.  Follow CBC in a.m. 3. Diabetes mellitus type 2- Metformin on hold.  CBG well controlled.  Continue with supersensitive sliding scale  insulin with NovoLog. 4. Chronic HFpEF-echocardiogram showed EF 37%, grade 3 diastolic dysfunction.  Continue torsemide, Aldactone. 5. Hypertension-continue spironolactone, blood pressure is stable. 6. CKD stage IIIa-creatinine stable at 1.40.  Patient baseline creatinine is around 1.2. 7. CAD s/p CABG-stable.  No chest pain. 8. Asthma-continue Breo 9. Hyperlipidemia-continue Lipitor   Scheduled medications:   . sodium chloride   Intravenous Once  . atorvastatin  40 mg Oral QHS  . gabapentin  100 mg Oral BID  . insulin aspart  0-6 Units Subcutaneous TID WC  . pantoprazole (PROTONIX) IV  40 mg Intravenous Q12H  . sodium chloride flush  3 mL Intravenous Q12H  . spironolactone  25 mg Oral Daily  . torsemide  40 mg Oral BID  . umeclidinium-vilanterol  1 puff Inhalation Daily         Data Reviewed:   CBG:  Recent Labs  Lab 01/11/21 0419 01/11/21 1118 01/11/21 1609 01/11/21 2154 01/12/21 0609  GLUCAP 99 189* 108* 143* 100*    SpO2: 97 % O2 Flow Rate (L/min): 2 L/min    Vitals:   01/12/21 0300 01/12/21 0400 01/12/21 0425 01/12/21 0500  BP:   (!) 99/43   Pulse:   (!) 54   Resp: 15 17  14   Temp:   98.2 F (36.8 C)   TempSrc:   Oral   SpO2:   97%   Weight:   108.3 kg   Height:         Intake/Output Summary (Last 24 hours) at 01/12/2021 1059 Last data filed at 01/12/2021 0432 Gross per 24 hour  Intake 1640.32 ml  Output 3100 ml  Net -  1459.68 ml    05/15 1901 - 05/17 0700 In: 2287.4 [P.O.:800; I.V.:841.4] Out: 7675 [Urine:7675]  Filed Weights   01/10/21 1700 01/11/21 0207 01/12/21 0425  Weight: 111.4 kg 109.6 kg 108.3 kg    CBC:  Recent Labs  Lab 01/08/21 0346 01/09/21 0508 01/10/21 0433 01/11/21 0442 01/12/21 0024  WBC 3.5* 3.1* 3.2* 2.8* 2.8*  HGB 7.6* 7.4* 7.4* 6.7* 7.6*  HCT 25.7* 25.8* 25.8* 22.9* 25.9*  PLT 136* 120* 111* 97* 102*  MCV 78.8* 81.1 81.4 80.1 80.7  MCH 23.3* 23.3* 23.3* 23.4* 23.7*  MCHC 29.6* 28.7* 28.7* 29.3* 29.3*   RDW 19.9* 20.4* 21.0* 21.1* 20.8*    Complete metabolic panel:  Recent Labs  Lab 01/06/21 1630 01/07/21 0725 01/07/21 1753 01/08/21 0346 01/09/21 0508 01/10/21 0433 01/11/21 0442 01/12/21 0024  NA 136 135  --  135 132* 137 136 137  K 3.6 3.5  --  4.1 4.3 4.3 4.0 3.8  CL 104 104  --  104 103 108 104 103  CO2 24 24  --  25 21* 25 27 27   GLUCOSE 73 70  --  115* 137* 116* 101* 134*  BUN 34* 32*  --  25* 22 23 24* 27*  CREATININE 1.33* 1.20  --  1.21 1.01 1.40* 1.54* 1.40*  CALCIUM 8.5* 8.6*  --  8.4* 8.3* 8.4* 7.9* 8.0*  AST 19  --  18  --   --   --   --   --   ALT 15  --  14  --   --   --   --   --   ALKPHOS 96  --  101  --   --   --   --   --   BILITOT 0.7  --  1.9*  --   --   --   --   --   ALBUMIN 3.7  --  3.6  --   --   --   --   --   MG  --  2.2  --   --  2.2 2.3 2.0 2.0  INR  --   --  1.2  --   --   --   --   --   HGBA1C  --   --  5.4  --   --   --   --   --   BNP 337.7*  --   --   --   --   --   --   --     No results for input(s): LIPASE, AMYLASE in the last 168 hours.  Recent Labs  Lab 01/06/21 1625 01/06/21 1630  BNP  --  337.7*  SARSCOV2NAA NEGATIVE  --     ------------------------------------------------------------------------------------------------------------------ No results for input(s): CHOL, HDL, LDLCALC, TRIG, CHOLHDL, LDLDIRECT in the last 72 hours.  Lab Results  Component Value Date   HGBA1C 5.4 01/07/2021   ------------------------------------------------------------------------------------------------------------------ No results for input(s): TSH, T4TOTAL, T3FREE, THYROIDAB in the last 72 hours.  Invalid input(s): FREET3 ------------------------------------------------------------------------------------------------------------------ No results for input(s): VITAMINB12, FOLATE, FERRITIN, TIBC, IRON, RETICCTPCT in the last 72 hours.  Coagulation profile Recent Labs  Lab 01/07/21 1753  INR 1.2   No results for input(s): DDIMER in  the last 72 hours.  Cardiac Enzymes No results for input(s): CKTOTAL, CKMB, CKMBINDEX, TROPONINI in the last 168 hours.  ------------------------------------------------------------------------------------------------------------------    Component Value Date/Time   BNP 337.7 (H) 01/06/2021 1630     Antibiotics: Anti-infectives (From admission, onward)  Start     Dose/Rate Route Frequency Ordered Stop   01/07/21 1100  cefTRIAXone (ROCEPHIN) 2 g in sodium chloride 0.9 % 100 mL IVPB        2 g 200 mL/hr over 30 Minutes Intravenous Every 24 hours 01/07/21 1010         Radiology Reports  No results found.    DVT prophylaxis: SCDs  Code Status: Full code  Family Communication: No family at bedside   Consultants:  Gastroenterology  Procedures:  EGD    Objective    Physical Examination:    General-appears in no acute distress  Heart-S1-S2, regular, no murmur auscultated  Lungs-clear to auscultation bilaterally, no wheezing or crackles auscultated  Abdomen-soft, nontender, no organomegaly  Extremities-bilateral 2+ edema in the lower extremities  Neuro-alert, oriented x3, no focal deficit noted   Status is: Inpatient  Dispo: The patient is from: Home              Anticipated d/c is to: Home              Anticipated d/c date is: 01/16/2021              Patient currently not stable for discharge  Barrier to discharge-awaiting BRTO/TIPS per IR  COVID-19 Labs  No results for input(s): DDIMER, FERRITIN, LDH, CRP in the last 72 hours.  Lab Results  Component Value Date   SARSCOV2NAA NEGATIVE 01/06/2021   SARSCOV2NAA NEGATIVE 11/18/2020   SARSCOV2NAA POSITIVE (A) 11/18/2020   [place] NEGATIVE 10/08/2020    Microbiology  Recent Results (from the past 240 hour(s))  Resp Panel by RT-PCR (Flu A&B, Covid) Nasopharyngeal Swab     Status: None   Collection Time: 01/06/21  4:25 PM   Specimen: Nasopharyngeal Swab; Nasopharyngeal(NP) swabs in vial  transport medium  Result Value Ref Range Status   SARS Coronavirus 2 by RT PCR NEGATIVE NEGATIVE Final    Comment: (NOTE) SARS-CoV-2 target nucleic acids are NOT DETECTED.  The SARS-CoV-2 RNA is generally detectable in upper respiratory specimens during the acute phase of infection. The lowest concentration of SARS-CoV-2 viral copies this assay can detect is 138 copies/mL. A negative result does not preclude SARS-Cov-2 infection and should not be used as the sole basis for treatment or other patient management decisions. A negative result may occur with  improper specimen collection/handling, submission of specimen other than nasopharyngeal swab, presence of viral mutation(s) within the areas targeted by this assay, and inadequate number of viral copies(<138 copies/mL). A negative result must be combined with clinical observations, patient history, and epidemiological information. The expected result is Negative.  Fact Sheet for Patients:  EntrepreneurPulse.com.au  Fact Sheet for Healthcare Providers:  IncredibleEmployment.be  This test is no t yet approved or cleared by the [place] FDA and  has been authorized for detection and/or diagnosis of SARS-CoV-2 by FDA under an Emergency Use Authorization (EUA). This EUA will remain  in effect (meaning this test can be used) for the duration of the COVID-19 declaration under Section 564(b)(1) of the Act, 21 U.S.C.section 360bbb-3(b)(1), unless the authorization is terminated  or revoked sooner.       Influenza A by PCR NEGATIVE NEGATIVE Final   Influenza B by PCR NEGATIVE NEGATIVE Final    Comment: (NOTE) The Xpert Xpress SARS-CoV-2/FLU/RSV plus assay is intended as an aid in the diagnosis of influenza from Nasopharyngeal swab specimens and should not be used as a sole basis for treatment. Nasal washings and aspirates are unacceptable for Xpert  Xpress SARS-CoV-2/FLU/RSV testing.  Fact  Sheet for Patients: EntrepreneurPulse.com.au  Fact Sheet for Healthcare Providers: IncredibleEmployment.be  This test is not yet approved or cleared by the [place] FDA and has been authorized for detection and/or diagnosis of SARS-CoV-2 by FDA under an Emergency Use Authorization (EUA). This EUA will remain in effect (meaning this test can be used) for the duration of the COVID-19 declaration under Section 564(b)(1) of the Act, 21 U.S.C. section 360bbb-3(b)(1), unless the authorization is terminated or revoked.  Performed at [facility], [place] [address], [place], [place] [address]              Oswald Hillock   [facility] If 7PM-7AM, please contact night-coverage at [email or web address], Office  [phone]   01/12/2021, 10:59 AM  LOS: 6 days

## 2021-01-12 NOTE — Progress Notes (Signed)
*  PRELIMINARY RESULTS* Echocardiogram 2D Echocardiogram has been performed.  Luisa Hart RDCS 01/12/2021, 10:36 AM

## 2021-01-12 NOTE — H&P (Addendum)
Chief Complaint: Patient was seen in consultation today for TIPS and gastroesophageal varices embolization  Chief Complaint  Patient presents with  . Shortness of Breath  . Leg Swelling   at the request of Dr. Michail Sermon, V.   Referring Physician(s): Dr. Michail Sermon, V  Supervising Physician: Ruthann Cancer  Patient Status: [facility] - In-pt  History of Present Illness: [name] is a 75 y.o. male with PMHs of hepatic cirrhosis, portal hypertension, esophageal varices, recurrent anemia, hepatocellular carcinoma status post Y90 with Dr. Earleen Newport in November 2021, CAD s/p CABG, diastolic CHF, EF 40%, CKD stage IIIa, asthma, diabetes mellitus type 2, hypertension, hyperlipidemia, GI bleed, gastric varices  presented to ED with shortness of breath and dizziness with complaints of dark stools on 01/06/21. Patient was found to have symptomatic anemia with hgb of 4.8. Patient was admitted with symptomatic anemia in March 2022 and underwent EGD, colonoscopy, and capsule endoscopy which were all negative for negative for acute bleeding.  Patient was admitted again and underwent EGD which showed a small esophageal varices, acute gastritis, Type 1 isolated gastric varices without bleeding and portal hypertensive gastropathy. IR was originally consulted for possible BRTO/TIPS procedure and patient underwent  CT BRTO protocol which showed no gastrointestinal hemorrhage or evidence of a renal portosystemic shunt.  Case was reviewed by Dr. Serafina Royals and a TIPS with  gastroesophageal varicies embolization was recommended instead of TIPS with BRTO, as patient does not have a renal portosystemic shunt, all of patient's gastroesophageal varices arise from splenic vein. After thorough discussion and shared decision making, patient decided to proceed with TIPS with gastroesophageal varices embolization.  Patient sitting at the side of bed, not in acute distress.  Reports chronic cough.  Denise headache, fever, chills,  shortness of breath, chest pain, abdominal pain, nausea ,vomiting, and bleeding.   Past Medical History:  Diagnosis Date  . Arthritis    "joints tighten up sometimes" (03/27/2104)  . Carcinoma of liver, hepatocellular ([place]) 06/30/2020  . CHF (congestive heart failure) ([place])   . Chronic lower back pain   . Coronary artery disease    a. 05/2012 Cath/PCI: LM 40ost, 50-60d, LAD 99p ruptured plaque (3.0x28 DES), LCX 50p/m, RCA 30-40p, 57m EF 65-70%.  . Coronary artery disease involving native coronary artery of native heart without angina pectoris    Severe left main disease at catheterization July 2015  CABG x3 with a LIMA to the LAD, SVG to the OM, SVG to the PDA on 03/31/14. EF 60% by cath.   . Diastolic heart failure ([place]   . GERD without esophagitis 08/04/2010  . Hepatic cirrhosis [place]    a. Dx 01/2014 - CT a/p   . History of blood transfusion    "related to bleeding ulcers"  . History of concussion    1976--  NO RESIDUAL  . History of GI bleed    a. UGIB 07/2012;  b. 01/2014 admission with GIB/FOB stool req 1U prbc's->EGD showed portal gastropathy, barrett's esoph, and chronic active h. pylori gastritis.  .Marland KitchenHistory of gout    2007 &  2008  LEFT LEG-- NO ISSUE SINCE  . Hyperlipidemia   . Iron deficiency anemia   . Kidney stones   . OA (osteoarthritis of spine)    LOWER BACK--  INTERMITTANT LEFT LEG NUMBNESS  . OSA (obstructive sleep apnea)    PULMOLOGIST-  DR CLANCE--  MODERATE OSA  STARTED CPAP 2012--  BUT CURRENTLY HAS NOT USED PAST 6 MONTHS  . Phimosis  a. s/p circumcision 2015.  . Type 2 diabetes mellitus ([place])   . Unspecified essential hypertension     Past Surgical History:  Procedure Laterality Date  . ANKLE FRACTURE SURGERY Right 1989   "plate put in"  . APPENDECTOMY  05-16-2004   open  . BIOPSY  01/08/2021   Procedure: BIOPSY;  Surgeon: Wilford Corner, MD;  Location: [facility];  Service: Endoscopy;;  . CIRCUMCISION N/A 09/09/2013   Procedure: CIRCUMCISION  ADULT;  Surgeon: Bernestine Amass, MD;  Location[facility];  Service: Urology;  Laterality: N/A;  . COLECTOMY  05-16-2004  . COLONOSCOPY WITH PROPOFOL N/A 11/20/2020   Procedure: COLONOSCOPY WITH PROPOFOL;  Surgeon: Ronald Lobo, MD;  Location: [facility];  Service: Endoscopy;  Laterality: N/A;  . CORONARY ANGIOPLASTY WITH STENT PLACEMENT  06/28/2012  DR COOPER   PCI W/  X1 DES to [place]. LAD/  LM  40% OSTIAL & 50-60% DISTAL /  50% PROX LCX/  30-40% PROX RCA & 50% MID RCA/   LVEF 65-70%  . CORONARY ARTERY BYPASS GRAFT N/A 03/31/2014   Procedure: CORONARY ARTERY BYPASS GRAFTING (CABG) times 3 using left internal mammary artery and right saphenous vein.;  Surgeon: Melrose Nakayama, MD;  Location: [place];  Service: Open Heart Surgery;  Laterality: N/A;  . DOBUTAMINE STRESS ECHO  06-08-2012   MODERATE HYPOKINESIS/ ISCHEMIA MID INFERIOR WALL  . ESOPHAGOGASTRODUODENOSCOPY  08/15/2012   Procedure: ESOPHAGOGASTRODUODENOSCOPY (EGD);  Surgeon: Wonda Horner, MD;  Location: [facility] ENDOSCOPY;  Service: Endoscopy;  Laterality: N/A;  . ESOPHAGOGASTRODUODENOSCOPY N/A 02/17/2014   Procedure: ESOPHAGOGASTRODUODENOSCOPY (EGD);  Surgeon: Jeryl Columbia, MD;  Location: [facility] ENDOSCOPY;  Service: Endoscopy;  Laterality: N/A;  . ESOPHAGOGASTRODUODENOSCOPY N/A 04/17/2020   Procedure: ESOPHAGOGASTRODUODENOSCOPY (EGD);  Surgeon: Ronnette Juniper, MD;  Location: [place];  Service: Gastroenterology;  Laterality: N/A;  . ESOPHAGOGASTRODUODENOSCOPY N/A 09/11/2020   Procedure: ESOPHAGOGASTRODUODENOSCOPY (EGD);  Surgeon: Clarene Essex, MD;  Location: [facility];  Service: Endoscopy;  Laterality: N/A;  . ESOPHAGOGASTRODUODENOSCOPY  10/09/2020  . ESOPHAGOGASTRODUODENOSCOPY N/A 10/09/2020   Procedure: ESOPHAGOGASTRODUODENOSCOPY (EGD);  Surgeon: Otis Brace, MD;  Location: [facility] ENDOSCOPY;  Service: Gastroenterology;  Laterality: N/A;  . ESOPHAGOGASTRODUODENOSCOPY N/A 01/08/2021   Procedure: ESOPHAGOGASTRODUODENOSCOPY  (EGD);  Surgeon: Wilford Corner, MD;  Location: [facility];  Service: Endoscopy;  Laterality: N/A;  . ESOPHAGOGASTRODUODENOSCOPY (EGD) WITH PROPOFOL N/A 09/30/2017   Procedure: ESOPHAGOGASTRODUODENOSCOPY (EGD) WITH PROPOFOL;  Surgeon: Wonda Horner, MD;  Location: [facility] ENDOSCOPY;  Service: Endoscopy;  Laterality: N/A;  . ESOPHAGOGASTRODUODENOSCOPY (EGD) WITH PROPOFOL N/A 12/20/2017   Procedure: ESOPHAGOGASTRODUODENOSCOPY (EGD) WITH PROPOFOL;  Surgeon: Clarene Essex, MD;  Location: [place];  Service: Endoscopy;  Laterality: N/A;  . ESOPHAGOGASTRODUODENOSCOPY (EGD) WITH PROPOFOL N/A 08/10/2020   Procedure: ESOPHAGOGASTRODUODENOSCOPY (EGD) WITH PROPOFOL;  Surgeon: Wilford Corner, MD;  Location: [place];  Service: Endoscopy;  Laterality: N/A;  . ESOPHAGOGASTRODUODENOSCOPY (EGD) WITH PROPOFOL N/A 11/20/2020   Procedure: ESOPHAGOGASTRODUODENOSCOPY (EGD) WITH PROPOFOL;  Surgeon: Ronald Lobo, MD;  [facility];  Service: Endoscopy;  Laterality: N/A;  . GIVENS CAPSULE STUDY N/A 11/20/2020   Procedure: GIVENS CAPSULE STUDY;  Surgeon: Ronald Lobo, MD;  Location: [facility];  Service: Endoscopy;  Laterality: N/A;  . HERNIA REPAIR    . INTRAOPERATIVE TRANSESOPHAGEAL ECHOCARDIOGRAM N/A 03/31/2014   Procedure: INTRAOPERATIVE TRANSESOPHAGEAL ECHOCARDIOGRAM;  Surgeon: Melrose Nakayama, MD;  Location: [place];  Service: Open Heart Surgery;  Laterality: N/A;  . IR ANGIOGRAM SELECTIVE EACH ADDITIONAL VESSEL  06/16/2020  . IR ANGIOGRAM SELECTIVE EACH ADDITIONAL VESSEL  06/16/2020  . IR ANGIOGRAM SELECTIVE EACH ADDITIONAL VESSEL  06/16/2020  . IR ANGIOGRAM SELECTIVE EACH ADDITIONAL VESSEL  06/16/2020  . IR ANGIOGRAM SELECTIVE EACH ADDITIONAL VESSEL  06/16/2020  . IR ANGIOGRAM SELECTIVE EACH ADDITIONAL VESSEL  06/16/2020  . IR ANGIOGRAM SELECTIVE EACH ADDITIONAL VESSEL  06/16/2020  . IR ANGIOGRAM SELECTIVE EACH ADDITIONAL VESSEL  06/16/2020  . IR ANGIOGRAM SELECTIVE EACH ADDITIONAL  VESSEL  06/16/2020  . IR ANGIOGRAM SELECTIVE EACH ADDITIONAL VESSEL  06/30/2020  . IR ANGIOGRAM SELECTIVE EACH ADDITIONAL VESSEL  06/30/2020  . IR ANGIOGRAM VISCERAL SELECTIVE  06/16/2020  . IR ANGIOGRAM VISCERAL SELECTIVE  06/30/2020  . IR EMBO ARTERIAL NOT HEMORR HEMANG INC GUIDE ROADMAPPING  06/16/2020  . IR EMBO TUMOR ORGAN ISCHEMIA INFARCT INC GUIDE ROADMAPPING  06/30/2020  . IR RADIOLOGIST EVAL & MGMT  05/06/2020  . IR RADIOLOGIST EVAL & MGMT  05/20/2020  . IR US GUIDE VASC ACCESS RIGHT  06/16/2020  . IR US GUIDE VASC ACCESS RIGHT  06/30/2020  . LAPAROSCOPIC UMBILICAL HERNIA REPAIR W/ MESH  06-06-2011  . LEFT HEART CATHETERIZATION WITH CORONARY ANGIOGRAM N/A 03/28/2014   Procedure: LEFT HEART CATHETERIZATION WITH CORONARY ANGIOGRAM;  Surgeon: Sinclair Grooms, MD;  Location: [facility] CATH LAB;  Service: Cardiovascular;  Laterality: N/A;  . LIPOMA EXCISION Left 08/25/2017   Procedure: EXCISION LIPOMA LEFT POSTERIOR THIGH;  Surgeon: Clovis Riley, MD;  Location: [place];  Service: General;  Laterality: Left;  . NEPHROLITHOTOMY  1990'S  . OPEN APPENDECTOMY W/ PARTIAL CECECTOMY  05-16-2004  . PERCUTANEOUS CORONARY STENT INTERVENTION (PCI-S) N/A 06/28/2012   Procedure: PERCUTANEOUS CORONARY STENT INTERVENTION (PCI-S);  Surgeon: Sherren Mocha, MD;  Location: [facility] CATH LAB;  Service: Cardiovascular;  Laterality: N/A;  . POLYPECTOMY  11/20/2020   Procedure: POLYPECTOMY;  Surgeon: Ronald Lobo, MD;  [facility];  Service: Endoscopy;;    Allergies: Fluzone quadrivalent [influenza vac split quad]  Medications: Prior to Admission medications   Medication Sig Start Date End Date Taking? Authorizing Provider  albuterol (VENTOLIN HFA) 108 (90 Base) MCG/ACT inhaler Inhale 2 puffs into the lungs every 6 (six) hours as needed for wheezing or shortness of breath. 03/12/20  Yes [name], MD  atorvastatin (LIPITOR) 40 MG tablet TAKE 1 TABLET BY MOUTH DAILY AT 6 PM Patient taking  differently: Take 40 mg by mouth daily at 6 PM. 01/04/21  Yes [name], MD  gabapentin (NEURONTIN) 100 MG capsule TAKE 1 CAPSULE(100 MG) BY MOUTH TWICE DAILY Patient taking differently: Take 100 mg by mouth 2 (two) times daily. 08/03/20  Yes [name], MD  glimepiride (AMARYL) 2 MG tablet TAKE 1 TABLET(2 MG) BY MOUTH DAILY BEFORE BREAKFAST Patient taking differently: Take 2 mg by mouth daily with breakfast. 12/16/20  Yes [name], MD  hydrocortisone (ANUSOL-HC) 2.5 % rectal cream Place 1 application rectally 2 (two) times daily. Patient taking differently: Place 1 application rectally 2 (two) times daily as needed for hemorrhoids. 12/04/20  Yes [name], MD  nitroGLYCERIN (NITROSTAT) 0.4 MG SL tablet Place 1 tablet (0.4 mg total) under the tongue every 5 (five) minutes as needed for chest pain. 06/08/16  Yes Nahser, Wonda Cheng, MD  polyethylene glycol powder (GLYCOLAX/MIRALAX) 17 GM/SCOOP powder Take 8.5 g by mouth in the morning, at noon, and at bedtime.   Yes [provider]  spironolactone (ALDACTONE) 25 MG tablet Take 1 tablet (25 mg total) by mouth daily. 08/18/20  Yes Danford, Suann Larry, MD  sucralfate ([place])  1 g tablet Take 1 tablet (1 g total) by mouth 4 (four) times daily. Patient taking differently: Take 2 g by mouth in the morning and at bedtime. 11/23/20 12/23/20 Yes Elodia Florence., MD  torsemide (DEMADEX) 20 MG tablet Take 2 tablets (40 mg total) by mouth 2 (two) times daily. 08/18/20  Yes Danford, Suann Larry, MD  umeclidinium-vilanterol (ANORO ELLIPTA) 62.5-25 MCG/INH AEPB Inhale 1 puff into the lungs daily. 03/12/20  Yes [name], MD  Accu-Chek Softclix Lancets lancets USE AS DIRECTED TO TEST BLOOD SUGAR FOUR TIMES DAILY 03/13/20   [name], MD  blood glucose meter kit and supplies Dispense based on patient and insurance preference. Use up to four times daily as directed. (FOR ICD-10 E10.9,  E11.9). 03/12/20   [name], MD  metFORMIN (GLUCOPHAGE) 850 MG tablet TAKE 1 TABLET(850 MG) BY MOUTH TWICE DAILY WITH A MEAL 01/08/21   [name], MD  pantoprazole (PROTONIX) 40 MG tablet Take 1 tablet (40 mg total) by mouth 2 (two) times daily. Patient not taking: No sig reported 09/12/20   Pokhrel, Corrie Mckusick, MD  potassium chloride (KLOR-CON) 20 MEQ tablet Take 1 tablet (20 mEq total) by mouth daily. Patient not taking: No sig reported 08/18/20   Edwin Dada, MD  Psyllium (METAMUCIL PO) Take 15 mLs by mouth 3 (three) times daily with meals. Mix with 4 oz of juice or water    [provider]     Family History  Problem Relation Age of Onset  . Lung cancer Sister   . Cancer Sister        lung  . Cancer Mother   . Cancer Father        died in his 40s.  . Cancer Brother        lung  . Coronary artery disease Other   . Diabetes Other   . Colon cancer Other   . Cancer Sister        lung    Social History   Socioeconomic History  . Marital status: Married    Spouse name: Not on file  . Number of children: Not on file  . Years of education: Not on file  . Highest education level: Not on file  Occupational History  . Not on file  Tobacco Use  . Smoking status: Former Smoker    Packs/day: 2.00    Years: 40.00    Pack years: 80.00    Types: Cigarettes    Quit date: 08/29/1996    Years since quitting: 24.3  . Smokeless tobacco: Never Used  Vaping Use  . Vaping Use: Never used  Substance and Sexual Activity  . Alcohol use: Yes    Alcohol/week: 8.0 standard drinks    Types: 8 Cans of beer per week    Comment: 2 beers every other day  . Drug use: No  . Sexual activity: Yes  Other Topics Concern  . Not on file  Social History Narrative   Lives in [place] with wife, son, and son's family.   Social Determinants of Health   Financial Resource Strain: Medium Risk  . Difficulty of Paying Living Expenses: Somewhat hard  Food Insecurity: No  Food Insecurity  . Worried About Charity fundraiser in the Last Year: Never true  . Ran Out of Food in the Last Year: Never true  Transportation Needs: No Transportation Needs  . Lack of Transportation (Medical): No  . Lack of Transportation (Non-Medical): No  Physical Activity: Not on file  Stress: Not on file  Social Connections: Not on file     Review of Systems: A 12 point ROS discussed and pertinent positives are indicated in the HPI above.  All other systems are negative.   Vital Signs: BP (!) 99/43   Pulse (!) 54   Temp 98.2 F (36.8 C) (Oral)   Resp 14   Ht $R'5\' 8"'om$  (1.727 m)   Wt 238 lb 12.8 oz (108.3 kg) Comment: scale a  SpO2 97%   BMI 36.31 kg/m   Physical Exam Vitals reviewed.  Constitutional:      General: He is not in acute distress.    Appearance: He is well-developed. He is not ill-appearing.  HENT:     Head: Normocephalic and atraumatic.  Cardiovascular:     Rate and Rhythm: Normal rate and regular rhythm.  Pulmonary:     Effort: Pulmonary effort is normal.     Breath sounds: Normal breath sounds.  Abdominal:     General: Bowel sounds are normal.     Palpations: Abdomen is soft.     Tenderness: There is no abdominal tenderness.  Skin:    General: Skin is warm and dry.  Neurological:     Mental Status: He is alert and oriented to person, place, and time.  Psychiatric:        Mood and Affect: Mood normal.        Behavior: Behavior normal.     MD Evaluation Airway: WNL Heart: WNL Abdomen: WNL Chest/ Lungs: WNL ASA  Classification: 3 Mallampati/Airway Score: Two  Imaging: DG Chest Port 1 View  Result Date: 01/06/2021 CLINICAL DATA:  Dyspnea.  Shortness of breath. EXAM: PORTABLE CHEST 1 VIEW COMPARISON:  11/20/2020 FINDINGS: Post median sternotomy and CABG. The heart is enlarged. Stable mediastinal contours. Interstitial edema which appears similar to prior exam. Probable right pleural effusion. No confluent consolidation or pneumothorax.  Stable osseous structures. IMPRESSION: Cardiomegaly with interstitial edema, similar to prior exam. Probable right pleural effusion. Electronically Signed   By: Keith Rake M.D.   On: 01/06/2021 16:50   ECHOCARDIOGRAM COMPLETE  Result Date: 01/12/2021    ECHOCARDIOGRAM REPORT   Patient Name:   [name] [facility] Date of Exam: 01/12/2021 Medical Rec #:  [id number]     Height:       68.0 in Accession #:    [id number]    Weight:       238.8 lb Date of Birth:  19-Feb-1946     BSA:          2.204 m Patient Age:    39 years      BP:           99/43 mmHg Patient Gender: M             HR:           53 bpm. Exam Location:  Inpatient Procedure: 2D Echo, Cardiac Doppler and Color Doppler Indications:    CHF and right heart function prior to TIPS procedure  History:        Patient has prior history of Echocardiogram examinations, most                 recent 05/27/2020. CHF, CAD, Defibrillator; Risk                 Factors:Hypertension, Diabetes and Dyslipidemia.  Sonographer:    Luisa Hart RDCS Referring Phys: [place]  1. Left ventricular ejection  fraction, by estimation, is 60 to 65%. The left ventricle has normal function. The left ventricle has no regional wall motion abnormalities. There is moderate concentric left ventricular hypertrophy. Left ventricular diastolic parameters are consistent with Grade III diastolic dysfunction (restrictive).  2. Right ventricular systolic function is low normal. The right ventricular size is normal. There is mildly elevated pulmonary artery systolic pressure.  3. The mitral valve is normal in structure. No evidence of mitral valve regurgitation. The mean mitral valve gradient is 4.0 mmHg with average heart rate of 56 bpm.  4. The aortic valve is tricuspid. There is moderate calcification of the aortic valve. Aortic valve regurgitation is mild.  5. The inferior vena cava is dilated in size with <50% respiratory variability, suggesting right atrial pressure of 15  mmHg. Comparison(s): A prior study was performed on 05/27/20. No significant change from prior study. Prior images reviewed side by side. RV is similar. FINDINGS  Left Ventricle: Left ventricular ejection fraction, by estimation, is 60 to 65%. The left ventricle has normal function. The left ventricle has no regional wall motion abnormalities. The left ventricular internal cavity size was normal in size. There is  moderate concentric left ventricular hypertrophy. Left ventricular diastolic parameters are consistent with Grade III diastolic dysfunction (restrictive). Right Ventricle: The right ventricular size is normal. No increase in right ventricular wall thickness. Right ventricular systolic function is low normal. There is mildly elevated pulmonary artery systolic pressure. The tricuspid regurgitant velocity is 2.67 m/s, and with an assumed right atrial pressure of 8 mmHg, the estimated right ventricular systolic pressure is 02.7 mmHg. Left Atrium: Left atrial size was normal in size. Right Atrium: Right atrial size was normal in size. Pericardium: There is no evidence of pericardial effusion. Mitral Valve: The mitral valve is normal in structure. No evidence of mitral valve regurgitation. MV peak gradient, 11.7 mmHg. The mean mitral valve gradient is 4.0 mmHg with average heart rate of 56 bpm. Tricuspid Valve: The tricuspid valve is grossly normal. Tricuspid valve regurgitation is not demonstrated. No evidence of tricuspid stenosis. Aortic Valve: The aortic valve is tricuspid. There is moderate calcification of the aortic valve. Aortic valve regurgitation is mild. Aortic regurgitation PHT measures 368 msec. Aortic valve mean gradient measures 8.0 mmHg. Aortic valve peak gradient measures 16.6 mmHg. Aortic valve area, by VTI measures 1.42 cm. Pulmonic Valve: The pulmonic valve was grossly normal. Pulmonic valve regurgitation is trivial. No evidence of pulmonic stenosis. Aorta: The aortic root and ascending aorta  are structurally normal, with no evidence of dilitation. Venous: The inferior vena cava is dilated in size with less than 50% respiratory variability, suggesting right atrial pressure of 15 mmHg. IAS/Shunts: The atrial septum is grossly normal.  LEFT VENTRICLE PLAX 2D LVIDd:         4.80 cm      Diastology LVIDs:         2.90 cm      LV e' medial:    7.18 cm/s LV PW:         1.40 cm      LV E/e' medial:  21.0 LV IVS:        1.60 cm      LV e' lateral:   8.70 cm/s LVOT diam:     2.00 cm      LV E/e' lateral: 17.4 LV SV:         73 LV SV Index:   33 LVOT Area:     3.14 cm  LV  Volumes (MOD) LV vol d, MOD A2C: 171.0 ml LV vol d, MOD A4C: 127.0 ml LV vol s, MOD A2C: 46.2 ml LV vol s, MOD A4C: 32.6 ml LV SV MOD A2C:     124.8 ml LV SV MOD A4C:     127.0 ml LV SV MOD BP:      106.9 ml RIGHT VENTRICLE RV S prime:     10.20 cm/s  PULMONARY VEINS TAPSE (M-mode): 1.3 cm      A Reversal Duration: 102.00 msec                             A Reversal Velocity: 26.60 cm/s                             Diastolic Velocity:  75.80 cm/s                             S/D Velocity:        0.50                             Systolic Velocity:   37.70 cm/s LEFT ATRIUM             Index       RIGHT ATRIUM           Index LA diam:        4.60 cm 2.09 cm/m  RA Area:     15.60 cm LA Vol (A2C):   84.1 ml 38.16 ml/m RA Volume:   35.00 ml  15.88 ml/m LA Vol (A4C):   49.4 ml 22.42 ml/m LA Biplane Vol: 64.4 ml 29.22 ml/m  AORTIC VALVE                    PULMONIC VALVE AV Area (Vmax):    1.59 cm     PV Vmax:       1.09 m/s AV Area (Vmean):   1.60 cm     PV Vmean:      72.700 cm/s AV Area (VTI):     1.42 cm     PV VTI:        0.267 m AV Vmax:           204.00 cm/s  PV Peak grad:  4.8 mmHg AV Vmean:          129.500 cm/s PV Mean grad:  2.0 mmHg AV VTI:            0.510 m AV Peak Grad:      16.6 mmHg AV Mean Grad:      8.0 mmHg LVOT Vmax:         103.00 cm/s LVOT Vmean:        66.100 cm/s LVOT VTI:          0.231 m LVOT/AV VTI ratio: 0.45 AI PHT:             368 msec  AORTA Ao Root diam: 3.60 cm Ao Asc diam:  3.15 cm MITRAL VALVE                TRICUSPID VALVE MV Area (PHT): 2.91 cm     TR Peak grad:   28.5 mmHg MV Area VTI:   1.41 cm     TR Vmax:  267.00 cm/s MV Peak grad:  11.7 mmHg MV Mean grad:  4.0 mmHg     SHUNTS MV Vmax:       1.71 m/s     Systemic VTI:  0.23 m MV Vmean:      93.8 cm/s    Systemic Diam: 2.00 cm MV Decel Time: 261 msec MV E velocity: 151.00 cm/s MV A velocity: 39.80 cm/s MV E/A ratio:  3.79 Rudean Haskell MD Electronically signed by Rudean Haskell MD Signature Date/Time: 01/12/2021/11:34:54 AM    Final    CT Angio Abd/Pel w/ and/or w/o  Result Date: 01/08/2021 CLINICAL DATA:  75 year old male with history of cirrhosis, hepatocellular carcinoma status post radiation segmentectomy segment 8 in November 2021, and gastroesophageal varices presenting with anemia and possible gastrointestinal hemorrhage. EXAM: CTA ABDOMEN AND PELVIS WITH CONTRAST TECHNIQUE: Multidetector CT imaging of the abdomen and pelvis was performed using the standard protocol before, during, and after bolus administration of intravenous contrast. Multiplanar reconstructed images and MIPs were obtained and reviewed to evaluate the vascular anatomy. CONTRAST:  175mL OMNIPAQUE IOHEXOL 350 MG/ML SOLN COMPARISON:  04/20/2020 FINDINGS: VASCULAR Aorta: Normal caliber aorta without aneurysm, dissection, vasculitis or significant stenosis. Circumferential atherosclerotic calcifications. Celiac: Patent without evidence of aneurysm, dissection, vasculitis or significant stenosis. SMA: Patent without evidence of aneurysm, dissection, vasculitis or significant stenosis. Renals: Severe stenosis of the single right renal ostia secondary prominent atherosclerotic plaque. The dual left renal arteries are patent. IMA: Patent without evidence of aneurysm, dissection, vasculitis or significant stenosis. Inflow: Patent without evidence of aneurysm, dissection, vasculitis  or significant stenosis. Proximal Outflow: Bilateral common femoral and visualized portions of the superficial and profunda femoral arteries are patent without evidence of aneurysm, dissection, vasculitis or significant stenosis. Veins: The hepatic veins appear patent. The portal system appears widely patent. Gastroesophageal varices arise from the splenic vein. No evidence of portosystemic shunt. The renal veins are widely patent. No evidence of iliocaval anomaly or thrombus. Review of the MIP images confirms the above findings. NON-VASCULAR Lower chest: Small right and trace left pleural effusions with associated passive bibasilar atelectasis. Mild diffuse ground-glass pulmonary opacities and interlobular septal thickening. Mild global cardiomegaly. No pericardial effusion. Severe coronary atherosclerotic calcifications. Partially visualized calcified mediastinal and hilar lymph nodes. Hepatobiliary: Shrunken and nodular contour of the liver compatible with history of cirrhosis. Ill-defined hypodensity in segment 8 measuring approximately 3.5 cm without appreciable enhancement on arterial or delayed venous phases. No new hepatomas. The gallbladder is present unremarkable. No intra or extrahepatic biliary ductal dilation. Pancreas: Unremarkable. No pancreatic ductal dilatation or surrounding inflammatory changes. Spleen: Upper limits of normal size measuring up to 13 mm in craniocaudal dimension. Scattered punctate calcifications. No mass. Adrenals/Urinary Tract: Adrenal glands are unremarkable. Unchanged diffuse atrophy of the right kidney with similar appearing upper pole nonobstructive renal calculus measuring up to 14 mm. The left kidney is normal. No hydronephrosis. The bladder is distended without wall thickening. Bladder is unremarkable. Stomach/Bowel: Multiple prominent and patent distal esophageal varices are visualized. Multiple prominent varices about the gastric cardia. Stomach is otherwise within  normal limits. Appendix is surgically absent. No evidence of bowel wall thickening, distention, or inflammatory changes. No evidence of intraluminal hemorrhage. Lymphatic: Prominent bilateral inguinal lymph nodes with preserved fatty hila, likely reactive. No abdominopelvic lymphadenopathy. Reproductive: Prostate is unremarkable. Other: Trace ascites.  Diffuse body wall anasarca. Musculoskeletal: No acute or significant osseous findings. IMPRESSION: VASCULAR 1. No evidence of gastrointestinal hemorrhage, as queried. 2. Prominent esophageal and gastric varices. No evidence of renal  portosystemic shunt. 3. Similar appearing severe focal stenosis about the right renal artery ostium with associated chronic right renal atrophy. NON-VASCULAR 1. Similar appearing morphologic changes of cirrhosis with secondary signs of portal hypertension including trace ascites, gastroesophageal varices, small right trace left pleural effusions, mild pulmonary edema, and body wall anasarca. With similar appearing cardiomegaly, there is likely partial cardiogenic etiology to fluid overload status. 2. Postprocedural changes after segment 8 hepatic radiation segmentectomy without evidence of persistent enhancing mass. 3. Evidence of prior granulomatous disease. 4. Nonobstructive right upper pole nephrolithiasis, similar to comparison. Ruthann Cancer, MD [facility] Radiology Electronically Signed   By: Ruthann Cancer MD   On: 01/08/2021 16:19    Labs:  CBC: Recent Labs    01/09/21 0508 01/10/21 0433 01/11/21 0442 01/12/21 0024  WBC 3.1* 3.2* 2.8* 2.8*  HGB 7.4* 7.4* 6.7* 7.6*  HCT 25.8* 25.8* 22.9* 25.9*  PLT 120* 111* 97* 102*    COAGS: Recent Labs    09/10/20 1833 10/09/20 1039 11/18/20 1324 01/07/21 1753  INR 1.1 1.2 1.2 1.2    BMP: Recent Labs    04/22/20 0953 04/23/20 0417 06/01/20 1414 06/16/20 0845 08/25/20 1420 09/10/20 1442 01/09/21 0508  01/10/21 0433 01/11/21 0442 01/12/21 0024  NA 137 139 141   < > 139   < > 132* 137 136 137  K 4.4 4.0 3.7   < > 3.8   < > 4.3 4.3 4.0 3.8  CL 103 103 102   < > 102   < > 103 108 104 103  CO2 _0 < > 24   < > 21* _1 GLUCOSE 190* 105* 149*   < > 209*   < > 137* 116* 101* 134*  BUN 16 19 24*   < > 26   < > 22 23 24* 27*  CALCIUM 9.1 8.7* 8.5*   < > 8.7   < > 8.3* 8.4* 7.9* 8.0*  CREATININE 1.27* 1.49* 1.06   < > 1.35*   < > 1.01 1.40* 1.54* 1.40*  GFRNONAA 55* 46* >60   < > 51*   < > >60 53* 47* 53*  GFRAA >60 53* >60  --  59*  --   --   --   --   --    < > = values in this interval not displayed.    LIVER FUNCTION TESTS: Recent Labs    12/04/20 1915 01/06/21 1630 01/07/21 1753 01/12/21 0024  BILITOT 0.7 0.7 1.9* 1.1  AST _2 ALT _3 ALKPHOS 145* 96 101 82  PROT 7.4 7.1 6.7 5.9*  ALBUMIN 3.9 3.7 3.6 3.2*    TUMOR MARKERS: No results for input(s): AFPTM, CEA, CA199, CHROMGRNA in the last 8760 hours.  Assessment and Plan: 75 y.o. male with history of cirrhosis and gastroesophageal varices with recurrent symptomatic anemia requiring hospitalizations and blood transfusions.  Patient was recently admitted in March 2022 due to symptomatic anemia and underwent EGD/colonoscopy/capsule endoscopy for GI bleed with which were all negative for active bleeding. Patient was admitted again on 01/06/21 due to symptomatic anemia with hgb of 4.8. Patient underwent EGD with GI again which showed gastroesophageal varices without acute bleeding.  IR was request for TIPS and BRTO by GI, patient underwent CT BRTO protocol which showed  no gastrointestinal hemorrhage or evidence of a renal portosystemic shunt.  Case was reviewed by Dr. Serafina Royals and a  TIPS with gastroesophageal varicies embolization was recommended instead of TIPS with BRTO, as all of patient's gastroesophageal varices arise from splenic vein. After thorough discussion and shared decision making, patient  decided to proceed with TIPS with gastroesophageal varices embolization.  The procedure is scheduled at 1pm tomorrow with general anesthesia.   Echocardiogram showed right ventricular systolic function low normal.  Labs orddered Made npo at midnight  Patient has scheduled ceftriaxone 2g at 1100 hrs daily, no additional abx needed for the IR procedure.   Risks and benefits of TIPS and additional variceal embolization were discussed with the patient and/or the patient's family including, but not limited to, infection, bleeding, damage to adjacent structures, worsening hepatic and/or cardiac function, worsening and/or the development of altered mental status/encephalopathy, non-target embolization and death.   This interventional procedure involves the use of X-rays and because of the nature of the planned procedure, it is possible that we will have prolonged use of X-ray fluoroscopy.  Potential radiation risks to you include (but are not limited to) the following: - A slightly elevated risk for cancer  several years later in life. This risk is typically less than 0.5% percent. This risk is low in comparison to the normal incidence of human cancer, which is 33% for women and 50% for men according to the [place]. - Radiation induced injury can include skin redness, resembling a rash, tissue breakdown / ulcers and hair loss (which can be temporary or permanent).   The likelihood of either of these occurring depends on the difficulty of the procedure and whether you are sensitive to radiation due to previous procedures, disease, or genetic conditions.   IF your procedure requires a prolonged use of radiation, you will be notified and given written instructions for further action.  It is your responsibility to monitor the irradiated area for the 2 weeks following the procedure and to notify your physician if you are concerned that you have suffered a radiation induced injury.    All  of the patient's questions were answered, patient is agreeable to proceed.  Consent signed and in chart.   Thank you for this interesting consult.  I greatly enjoyed meeting HENLEY BOETTNER and look forward to participating in their care.  A copy of this report was sent to the requesting provider on this date.  Electronically Signed: Tera Mater, PA-C 01/12/2021, 2:22 PM   I spent a total of    25 Minutes in face to face in clinical consultation, greater than 50% of which was counseling/coordinating care for TIPS and gastroesophageal varices embolization

## 2021-01-13 ENCOUNTER — Encounter (HOSPITAL_COMMUNITY)
Admission: EM | Disposition: A | Discharge status: Home / Self Care | Payer: Self-pay | Source: Home / Self Care | Attending: Internal Medicine

## 2021-01-13 ENCOUNTER — Inpatient Hospital Stay (HOSPITAL_COMMUNITY): Payer: Medicare HMO

## 2021-01-13 ENCOUNTER — Other Ambulatory Visit (HOSPITAL_COMMUNITY): Payer: Medicare HMO

## 2021-01-13 ENCOUNTER — Inpatient Hospital Stay (HOSPITAL_COMMUNITY): Payer: Medicare HMO | Admitting: Anesthesiology

## 2021-01-13 ENCOUNTER — Encounter (HOSPITAL_COMMUNITY): Payer: Self-pay | Admitting: Internal Medicine

## 2021-01-13 HISTORY — PX: IR TIPS: IMG2295

## 2021-01-13 HISTORY — PX: IR EMBO VENOUS NOT HEMORR HEMANG  INC GUIDE ROADMAPPING: IMG5447

## 2021-01-13 HISTORY — PX: RADIOLOGY WITH ANESTHESIA: SHX6223

## 2021-01-13 HISTORY — PX: IR IVUS EACH ADDITIONAL NON CORONARY VESSEL: IMG6086

## 2021-01-13 HISTORY — PX: IR US GUIDE VASC ACCESS RIGHT: IMG2390

## 2021-01-13 LAB — POCT I-STAT 7, (LYTES, BLD GAS, ICA,H+H)
Acid-Base Excess: 5 mmol/L — ABNORMAL HIGH (ref 0.0–2.0)
Bicarbonate: 29.3 mmol/L — ABNORMAL HIGH (ref 20.0–28.0)
Calcium, Ion: 1.09 mmol/L — ABNORMAL LOW (ref 1.15–1.40)
HCT: 27 % — ABNORMAL LOW (ref 39.0–52.0)
Hemoglobin: 9.2 g/dL — ABNORMAL LOW (ref 13.0–17.0)
O2 Saturation: 100 %
Patient temperature: 35.8
Potassium: 3.7 mmol/L (ref 3.5–5.1)
Sodium: 142 mmol/L (ref 135–145)
TCO2: 31 mmol/L (ref 22–32)
pCO2 arterial: 39.9 mmHg (ref 32.0–48.0)
pH, Arterial: 7.469 — ABNORMAL HIGH (ref 7.350–7.450)
pO2, Arterial: 328 mmHg — ABNORMAL HIGH (ref 83.0–108.0)

## 2021-01-13 LAB — HEPATIC FUNCTION PANEL
ALT: 11 U/L (ref 0–44)
AST: 17 U/L (ref 15–41)
Albumin: 3.2 g/dL — ABNORMAL LOW (ref 3.5–5.0)
Alkaline Phosphatase: 83 U/L (ref 38–126)
Bilirubin, Direct: 0.2 mg/dL (ref 0.0–0.2)
Indirect Bilirubin: 0.5 mg/dL (ref 0.3–0.9)
Total Bilirubin: 0.7 mg/dL (ref 0.3–1.2)
Total Protein: 5.9 g/dL — ABNORMAL LOW (ref 6.5–8.1)

## 2021-01-13 LAB — CBC
HCT: 26.8 % — ABNORMAL LOW (ref 39.0–52.0)
Hemoglobin: 7.9 g/dL — ABNORMAL LOW (ref 13.0–17.0)
MCH: 23.7 pg — ABNORMAL LOW (ref 26.0–34.0)
MCHC: 29.5 g/dL — ABNORMAL LOW (ref 30.0–36.0)
MCV: 80.2 fL (ref 80.0–100.0)
Platelets: 91 10*3/uL — ABNORMAL LOW (ref 150–400)
RBC: 3.34 MIL/uL — ABNORMAL LOW (ref 4.22–5.81)
RDW: 21.1 % — ABNORMAL HIGH (ref 11.5–15.5)
WBC: 3.9 10*3/uL — ABNORMAL LOW (ref 4.0–10.5)
nRBC: 0 % (ref 0.0–0.2)

## 2021-01-13 LAB — GLUCOSE, CAPILLARY
Glucose-Capillary: 117 mg/dL — ABNORMAL HIGH (ref 70–99)
Glucose-Capillary: 126 mg/dL — ABNORMAL HIGH (ref 70–99)
Glucose-Capillary: 146 mg/dL — ABNORMAL HIGH (ref 70–99)
Glucose-Capillary: 153 mg/dL — ABNORMAL HIGH (ref 70–99)
Glucose-Capillary: 145 mg/dL — ABNORMAL HIGH (ref 70–99)
Glucose-Capillary: 191 mg/dL — ABNORMAL HIGH (ref 70–99)

## 2021-01-13 LAB — BASIC METABOLIC PANEL
Anion gap: 6 (ref 5–15)
BUN: 26 mg/dL — ABNORMAL HIGH (ref 8–23)
CO2: 29 mmol/L (ref 22–32)
Calcium: 8.3 mg/dL — ABNORMAL LOW (ref 8.9–10.3)
Chloride: 103 mmol/L (ref 98–111)
Creatinine, Ser: 1.24 mg/dL (ref 0.61–1.24)
GFR, Estimated: >60 mL/min (ref >60)
Glucose, Bld: 117 mg/dL — ABNORMAL HIGH (ref 70–99)
Potassium: 4 mmol/L (ref 3.5–5.1)
Sodium: 138 mmol/L (ref 135–145)

## 2021-01-13 LAB — CBC WITH DIFFERENTIAL/PLATELET
Abs Immature Granulocytes: 0.01 10*3/uL (ref 0.00–0.07)
Basophils Absolute: 0 10*3/uL (ref 0.0–0.1)
Basophils Relative: 1 %
Eosinophils Absolute: 0.1 10*3/uL (ref 0.0–0.5)
Eosinophils Relative: 5 %
HCT: 26 % — ABNORMAL LOW (ref 39.0–52.0)
Hemoglobin: 7.7 g/dL — ABNORMAL LOW (ref 13.0–17.0)
Immature Granulocytes: 0 %
Lymphocytes Relative: 17 %
Lymphs Abs: 0.5 10*3/uL — ABNORMAL LOW (ref 0.7–4.0)
MCH: 23.8 pg — ABNORMAL LOW (ref 26.0–34.0)
MCHC: 29.6 g/dL — ABNORMAL LOW (ref 30.0–36.0)
MCV: 80.5 fL (ref 80.0–100.0)
Monocytes Absolute: 0.3 10*3/uL (ref 0.1–1.0)
Monocytes Relative: 9 %
Neutro Abs: 1.8 10*3/uL (ref 1.7–7.7)
Neutrophils Relative %: 68 %
Platelets: 90 10*3/uL — ABNORMAL LOW (ref 150–400)
RBC: 3.23 MIL/uL — ABNORMAL LOW (ref 4.22–5.81)
RDW: 20.6 % — ABNORMAL HIGH (ref 11.5–15.5)
WBC: 2.7 10*3/uL — ABNORMAL LOW (ref 4.0–10.5)
nRBC: 0 % (ref 0.0–0.2)

## 2021-01-13 LAB — POCT I-STAT, CHEM 8
BUN: 24 mg/dL — ABNORMAL HIGH (ref 8–23)
Calcium, Ion: 1.11 mmol/L — ABNORMAL LOW (ref 1.15–1.40)
Chloride: 100 mmol/L (ref 98–111)
Creatinine, Ser: 1.1 mg/dL (ref 0.61–1.24)
Glucose, Bld: 140 mg/dL — ABNORMAL HIGH (ref 70–99)
HCT: 25 % — ABNORMAL LOW (ref 39.0–52.0)
Hemoglobin: 8.5 g/dL — ABNORMAL LOW (ref 13.0–17.0)
Potassium: 3.8 mmol/L (ref 3.5–5.1)
Sodium: 140 mmol/L (ref 135–145)
TCO2: 27 mmol/L (ref 22–32)

## 2021-01-13 LAB — PREPARE RBC (CROSSMATCH)

## 2021-01-13 LAB — MAGNESIUM: Magnesium: 2.1 mg/dL (ref 1.7–2.4)

## 2021-01-13 LAB — AMMONIA: Ammonia: 31 umol/L (ref 9–35)

## 2021-01-13 SURGERY — IR WITH ANESTHESIA
Anesthesia: General

## 2021-01-13 MED ORDER — LACTATED RINGERS IV SOLN: INTRAVENOUS | Status: DC | PRN

## 2021-01-13 MED ORDER — GELATIN ABSORBABLE 12-7 MM EX MISC
CUTANEOUS | Status: DC | PRN
  Administered 2021-01-13: 1 via TOPICAL

## 2021-01-13 MED ORDER — PROPOFOL 10 MG/ML IV BOLUS
INTRAVENOUS | Status: DC | PRN
  Administered 2021-01-13: 100 mg via INTRAVENOUS

## 2021-01-13 MED ORDER — IOHEXOL 300 MG/ML  SOLN
100.0000 mL | Freq: Once | INTRAMUSCULAR | Status: AC | PRN
  Administered 2021-01-13: 90 mL via INTRAVENOUS

## 2021-01-13 MED ORDER — CHLORHEXIDINE GLUCONATE 0.12 % MT SOLN
15.0000 mL | Freq: Once | OROMUCOSAL | Status: AC
Start: 2021-01-13 — End: 2021-01-13

## 2021-01-13 MED ORDER — ALBUMIN HUMAN 5 % IV SOLN: INTRAVENOUS | Status: DC | PRN

## 2021-01-13 MED ORDER — ORAL CARE MOUTH RINSE: 15.0000 mL | Freq: Once | OROMUCOSAL | Status: AC

## 2021-01-13 MED ORDER — GELATIN ABSORBABLE 12-7 MM EX MISC
CUTANEOUS | Status: AC
  Filled 2021-01-13: qty 1

## 2021-01-13 MED ORDER — SUGAMMADEX SODIUM 200 MG/2ML IV SOLN
INTRAVENOUS | Status: DC | PRN
  Administered 2021-01-13: 225 mg via INTRAVENOUS

## 2021-01-13 MED ORDER — FENTANYL CITRATE (PF) 100 MCG/2ML IJ SOLN
INTRAMUSCULAR | Status: AC
  Filled 2021-01-13: qty 2

## 2021-01-13 MED ORDER — CHLORHEXIDINE GLUCONATE 0.12 % MT SOLN
OROMUCOSAL | Status: AC
  Administered 2021-01-13: 15 mL via OROMUCOSAL
  Filled 2021-01-13: qty 15

## 2021-01-13 MED ORDER — PHENYLEPHRINE 40 MCG/ML (10ML) SYRINGE FOR IV PUSH (FOR BLOOD PRESSURE SUPPORT)
PREFILLED_SYRINGE | INTRAVENOUS | Status: DC | PRN
  Administered 2021-01-13: 80 ug via INTRAVENOUS
  Administered 2021-01-13: 160 ug via INTRAVENOUS

## 2021-01-13 MED ORDER — IOHEXOL 300 MG/ML  SOLN
50.0000 mL | Freq: Once | INTRAMUSCULAR | Status: AC | PRN
  Administered 2021-01-13: 40 mL via INTRAVENOUS

## 2021-01-13 MED ORDER — PHENYLEPHRINE HCL-NACL 10-0.9 MG/250ML-% IV SOLN
INTRAVENOUS | Status: DC | PRN
  Administered 2021-01-13: 25 ug/min via INTRAVENOUS

## 2021-01-13 MED ORDER — DEXTROSE 5 % IV SOLN
INTRAVENOUS | Status: DC | PRN
  Administered 2021-01-13: 2 g via INTRAVENOUS

## 2021-01-13 MED ORDER — FENTANYL CITRATE (PF) 100 MCG/2ML IJ SOLN
INTRAMUSCULAR | Status: DC | PRN
  Administered 2021-01-13 (×2): 50 ug via INTRAVENOUS

## 2021-01-13 MED ORDER — SODIUM CHLORIDE 0.9 % IV SOLN: INTRAVENOUS | Status: DC

## 2021-01-13 MED ORDER — LACTULOSE 10 GM/15ML PO SOLN
10.0000 g | Freq: Three times a day (TID) | ORAL | Status: DC
  Administered 2021-01-13 – 2021-01-14 (×3): 10 g via ORAL
  Filled 2021-01-13 (×3): qty 15

## 2021-01-13 MED ORDER — ROCURONIUM BROMIDE 100 MG/10ML IV SOLN
INTRAVENOUS | Status: DC | PRN
  Administered 2021-01-13: 20 mg via INTRAVENOUS
  Administered 2021-01-13: 10 mg via INTRAVENOUS
  Administered 2021-01-13: 80 mg via INTRAVENOUS

## 2021-01-13 MED ORDER — PHENYLEPHRINE HCL (PRESSORS) 10 MG/ML IV SOLN: INTRAVENOUS | Status: DC | PRN

## 2021-01-13 MED ORDER — SODIUM CHLORIDE 0.9% IV SOLUTION: Freq: Once | INTRAVENOUS | Status: DC

## 2021-01-13 MED ORDER — LIDOCAINE HCL (CARDIAC) PF 100 MG/5ML IV SOSY
PREFILLED_SYRINGE | INTRAVENOUS | Status: DC | PRN
  Administered 2021-01-13: 60 mg via INTRAVENOUS

## 2021-01-13 MED ORDER — DEXAMETHASONE SODIUM PHOSPHATE 4 MG/ML IJ SOLN
INTRAMUSCULAR | Status: DC | PRN
  Administered 2021-01-13: 4 mg via INTRAVENOUS

## 2021-01-13 NOTE — Transfer of Care (Signed)
Immediate Anesthesia Transfer of Care Note  Patient: [name]  Procedure(s) Performed: IR WITH ANESTHESIA - TIPS (N/A )  Patient Location: PACU  Anesthesia Type:General  Level of Consciousness: awake, alert , oriented and patient cooperative  Airway & Oxygen Therapy: Patient Spontanous Breathing and Patient connected to nasal cannula oxygen  Post-op Assessment: Report given to RN and Post -op Vital signs reviewed and stable  Post vital signs: Reviewed and stable   Last Vitals:  Vitals Value Taken Time  BP 141/58 01/13/21 1511  Temp 36.3 C 01/13/21 1510  Pulse 69 01/13/21 1515  Resp 14 01/13/21 1515  SpO2 97 % 01/13/21 1515  Vitals shown include unvalidated device data.  Last Pain:  Vitals:   01/13/21 1058  TempSrc:   PainSc: 0-No pain      Patients Stated Pain Goal: 0 (99/83/38 2505)  Complications: No complications documented.

## 2021-01-13 NOTE — Sedation Documentation (Signed)
RA pressure 27/20 (24)

## 2021-01-13 NOTE — Anesthesia Preprocedure Evaluation (Addendum)
Anesthesia Evaluation  Patient identified by MRN, date of birth, ID band Patient awake    Reviewed: Allergy & Precautions, NPO status , Patient's Chart, lab work & pertinent test results  Airway Mallampati: III  TM Distance: >3 FB Neck ROM: Full  Mouth opening: Limited Mouth Opening  Dental  (+) Edentulous Upper, Edentulous Lower, Dental Advisory Given   Pulmonary asthma , sleep apnea (does not use CPAP) , former smoker,    Pulmonary exam normal breath sounds clear to auscultation       Cardiovascular hypertension, + CAD, + Cardiac Stents (2013), + CABG (CABGx3 2015 LIMA to the LAD, SVG to the OM, SVG to the PDA) and +CHF  Normal cardiovascular exam Rhythm:Regular Rate:Normal  TTE 2022 1. Left ventricular ejection fraction, by estimation, is 60 to 65%. The  left ventricle has normal function. The left ventricle has no regional  wall motion abnormalities. There is moderate concentric left ventricular  hypertrophy. Left ventricular  diastolic parameters are consistent with Grade III diastolic dysfunction  (restrictive).  2. Right ventricular systolic function is low normal. The right  ventricular size is normal. There is mildly elevated pulmonary artery  systolic pressure.  3. The mitral valve is normal in structure. No evidence of mitral valve  regurgitation. The mean mitral valve gradient is 4.0 mmHg with average  heart rate of 56 bpm.  4. The aortic valve is tricuspid. There is moderate calcification of the  aortic valve. Aortic valve regurgitation is mild.  5. The inferior vena cava is dilated in size with <50% respiratory  variability, suggesting right atrial pressure of 15 mmHg.    Neuro/Psych negative neurological ROS  negative psych ROS   GI/Hepatic PUD, GERD  Medicated and Controlled,(+) Cirrhosis   Esophageal Varices    , Portal hypertension, H/o hepatocellular carcinoma, h/o gastric varices and GIB    Endo/Other  negative endocrine ROSdiabetes, Type 2, Oral Hypoglycemic Agents  Renal/GU Renal InsufficiencyRenal disease (CKD stage III)  negative genitourinary   Musculoskeletal negative musculoskeletal ROS (+)   Abdominal   Peds  Hematology  (+) Blood dyscrasia, anemia ,   Anesthesia Other Findings Presented with SOB and dizziness found to have Hgb 4.8. Hgb now 7.7 after 5U RBCs. Plt 90  Reproductive/Obstetrics                           Anesthesia Physical Anesthesia Plan  ASA: III  Anesthesia Plan: General   Post-op Pain Management:    Induction: Intravenous  PONV Risk Score and Plan: 2 and Dexamethasone, Ondansetron and Treatment may vary due to age or medical condition  Airway Management Planned: Oral ETT  Additional Equipment: Arterial line  Intra-op Plan:   Post-operative Plan: Extubation in OR  Informed Consent: I have reviewed the patients History and Physical, chart, labs and discussed the procedure including the risks, benefits and alternatives for the proposed anesthesia with the patient or authorized representative who has indicated his/her understanding and acceptance.     Dental advisory given  Plan Discussed with: CRNA  Anesthesia Plan Comments:        Anesthesia Quick Evaluation

## 2021-01-13 NOTE — H&P (Signed)
HPI:  The patient has had a H&P performed within the last 30 days, all history, medications, and exam have been reviewed. The patient denies any interval changes since the H&P.  Complains of dyspnea, stable at this time. Denies fever, chills, chest pain, abdominal pain, or headache.   Medications: Prior to Admission medications   Medication Sig Start Date End Date Taking? Authorizing Provider  albuterol (VENTOLIN HFA) 108 (90 Base) MCG/ACT inhaler Inhale 2 puffs into the lungs every 6 (six) hours as needed for wheezing or shortness of breath. 03/12/20  Yes [name], MD  atorvastatin (LIPITOR) 40 MG tablet TAKE 1 TABLET BY MOUTH DAILY AT 6 PM Patient taking differently: Take 40 mg by mouth daily at 6 PM. 01/04/21  Yes [name], MD  gabapentin (NEURONTIN) 100 MG capsule TAKE 1 CAPSULE(100 MG) BY MOUTH TWICE DAILY Patient taking differently: Take 100 mg by mouth 2 (two) times daily. 08/03/20  Yes [name], MD  glimepiride (AMARYL) 2 MG tablet TAKE 1 TABLET(2 MG) BY MOUTH DAILY BEFORE BREAKFAST Patient taking differently: Take 2 mg by mouth daily with breakfast. 12/16/20  Yes [name], MD  hydrocortisone (ANUSOL-HC) 2.5 % rectal cream Place 1 application rectally 2 (two) times daily. Patient taking differently: Place 1 application rectally 2 (two) times daily as needed for hemorrhoids. 12/04/20  Yes [name], MD  nitroGLYCERIN (NITROSTAT) 0.4 MG SL tablet Place 1 tablet (0.4 mg total) under the tongue every 5 (five) minutes as needed for chest pain. 06/08/16  Yes Nahser, Wonda Cheng, MD  polyethylene glycol powder (GLYCOLAX/MIRALAX) 17 GM/SCOOP powder Take 8.5 g by mouth in the morning, at noon, and at bedtime.   Yes [provider]  spironolactone (ALDACTONE) 25 MG tablet Take 1 tablet (25 mg total) by mouth daily. 08/18/20  Yes Danford, Suann Larry, MD  sucralfate (CARAFATE) 1 g tablet Take 1 tablet (1 g total) by mouth 4 (four)  times daily. Patient taking differently: Take 2 g by mouth in the morning and at bedtime. 11/23/20 12/23/20 Yes Elodia Florence., MD  torsemide (DEMADEX) 20 MG tablet Take 2 tablets (40 mg total) by mouth 2 (two) times daily. 08/18/20  Yes Danford, Suann Larry, MD  umeclidinium-vilanterol (ANORO ELLIPTA) 62.5-25 MCG/INH AEPB Inhale 1 puff into the lungs daily. 03/12/20  Yes [name], MD  Accu-Chek Softclix Lancets lancets USE AS DIRECTED TO TEST BLOOD SUGAR FOUR TIMES DAILY 03/13/20   [name], MD  blood glucose meter kit and supplies Dispense based on patient and insurance preference. Use up to four times daily as directed. (FOR ICD-10 E10.9, E11.9). 03/12/20   [name], MD  metFORMIN (GLUCOPHAGE) 850 MG tablet TAKE 1 TABLET(850 MG) BY MOUTH TWICE DAILY WITH A MEAL 01/08/21   [name], MD  pantoprazole (PROTONIX) 40 MG tablet Take 1 tablet (40 mg total) by mouth 2 (two) times daily. Patient not taking: No sig reported 09/12/20   Pokhrel, Corrie Mckusick, MD  potassium chloride (KLOR-CON) 20 MEQ tablet Take 1 tablet (20 mEq total) by mouth daily. Patient not taking: No sig reported 08/18/20   Edwin Dada, MD  Psyllium (METAMUCIL PO) Take 15 mLs by mouth 3 (three) times daily with meals. Mix with 4 oz of juice or water    [provider]     Vital Signs: BP 126/61 (BP Location: Right Arm)   Pulse (!) 53   Temp 98.4 F (36.9 C) (Oral)   Resp  20   Ht _0  (1.727 m)   Wt 232 lb 12.8 oz (105.6 kg) Comment: scale a  SpO2 96%   BMI 35.40 kg/m   Physical Exam Vitals and nursing note reviewed.  Constitutional:      General: He is not in acute distress.    Appearance: He is obese.  Cardiovascular:     Rate and Rhythm: Normal rate and regular rhythm.     Heart sounds: Normal heart sounds. No murmur heard.   Pulmonary:     Effort: Pulmonary effort is normal. No respiratory distress.     Breath sounds: Normal breath sounds. No  wheezing.  Skin:    General: Skin is warm and dry.  Neurological:     Mental Status: He is alert and oriented to person, place, and time.     Mallampati Score:  MD Evaluation Airway: WNL Heart: WNL Abdomen: WNL Chest/ Lungs: WNL ASA  Classification: 3 Mallampati/Airway Score: Two  Labs:  CBC: Recent Labs    01/10/21 0433 01/11/21 0442 01/12/21 0024 01/13/21 0427  WBC 3.2* 2.8* 2.8* 2.7*  HGB 7.4* 6.7* 7.6* 7.7*  HCT 25.8* 22.9* 25.9* 26.0*  PLT 111* 97* 102* 90*    COAGS: Recent Labs    09/10/20 1833 10/09/20 1039 11/18/20 1324 01/07/21 1753  INR 1.1 1.2 1.2 1.2    BMP: Recent Labs    04/22/20 0953 04/23/20 0417 06/01/20 1414 06/16/20 0845 08/25/20 1420 09/10/20 1442 01/10/21 0433 01/11/21 0442 01/12/21 0024 01/13/21 0427  NA 137 139 141   < > 139   < > 137 136 137 138  K 4.4 4.0 3.7   < > 3.8   < > 4.3 4.0 3.8 4.0  CL 103 103 102   < > 102   < > 108 104 103 103  CO2 _1 < > 24   < > _2 GLUCOSE 190* 105* 149*   < > 209*   < > 116* 101* 134* 117*  BUN 16 19 24*   < > 26   < > 23 24* 27* 26*  CALCIUM 9.1 8.7* 8.5*   < > 8.7   < > 8.4* 7.9* 8.0* 8.3*  CREATININE 1.27* 1.49* 1.06   < > 1.35*   < > 1.40* 1.54* 1.40* 1.24  GFRNONAA 55* 46* >60   < > 51*   < > 53* 47* 53* >60  GFRAA >60 53* >60  --  59*  --   --   --   --   --    < > = values in this interval not displayed.    LIVER FUNCTION TESTS: Recent Labs    01/06/21 1630 01/07/21 1753 01/12/21 0024 01/13/21 0427  BILITOT 0.7 1.9* 1.1 0.7  AST _3 ALT _4 ALKPHOS 96 101 82 83  PROT 7.1 6.7 5.9* 5.9*  ALBUMIN 3.7 3.6 3.2* 3.2*    Assessment/Plan:   Cirrhosis with associated hemorrhagic gastroesophageal varices (currently stable without evidence of active hemorrhage) in setting of prior [facility] s/p TARE segmentectomy 06/2020. Plan for image-guided TIPS creation with possible gastric variceal embolization and possible paracentesis today in IR with Dr.  Serafina Royals and anesthesia. Patient is NPO. Afebrile. He does not take blood thinners. INR 1.2 01/07/2021.  Risks and benefits of TIPS, BRTO and/or additional variceal embolization were discussed with the patient and/or the patient's family including, but not limited to, infection, bleeding,  damage to adjacent structures, worsening hepatic and/or cardiac function, non-target embolization and death.  This interventional procedure involves the use of X-rays and because of the nature of the planned procedure, it is possible that we will have prolonged use of X-ray fluoroscopy. Potential radiation risks to you include (but are not limited to) the following: - A slightly elevated risk for cancer  several years later in life. This risk is typically less than 0.5% percent. This risk is low in comparison to the normal incidence of human cancer, which is 33% for women and 50% for men according to the [place]. - Radiation induced injury can include skin redness, resembling a rash, tissue breakdown / ulcers and hair loss (which can be temporary or permanent).  The likelihood of either of these occurring depends on the difficulty of the procedure and whether you are sensitive to radiation due to previous procedures, disease, or genetic conditions.  IF your procedure requires a prolonged use of radiation, you will be notified and given written instructions for further action.  It is your responsibility to monitor the irradiated area for the 2 weeks following the procedure and to notify your physician if you are concerned that you have suffered a radiation induced injury.   All of the patient's questions were answered, patient is agreeable to proceed. Consent signed and in chart.   Signed: Earley Abide 01/13/2021, 11:15 AM

## 2021-01-13 NOTE — Anesthesia Procedure Notes (Signed)
Procedure Name: Intubation Date/Time: 01/13/2021 11:55 AM Performed by: Oletta Lamas, CRNA Pre-anesthesia Checklist: Patient identified, Emergency Drugs available, Suction available and Patient being monitored Patient Re-evaluated:Patient Re-evaluated prior to induction Oxygen Delivery Method: Circle System Utilized Preoxygenation: Pre-oxygenation with 100% oxygen Induction Type: IV induction Ventilation: Mask ventilation without difficulty Laryngoscope Size: Mac and 4 Grade View: Grade I Tube type: Oral Tube size: 8.0 mm Number of attempts: 1 Airway Equipment and Method: Stylet Placement Confirmation: ETT inserted through vocal cords under direct vision,  positive ETCO2 and breath sounds checked- equal and bilateral Secured at: 23 cm Tube secured with: Tape Dental Injury: Teeth and Oropharynx as per pre-operative assessment

## 2021-01-13 NOTE — Plan of Care (Signed)

## 2021-01-13 NOTE — Plan of Care (Signed)
  Problem: Clinical Measurements: Goal: Diagnostic test results will improve Outcome: Progressing   Problem: Coping: Goal: Level of anxiety will decrease Outcome: Progressing   

## 2021-01-13 NOTE — Sedation Documentation (Signed)
Post RA pressure 27/18 (24).

## 2021-01-13 NOTE — Progress Notes (Signed)
PROGRESS NOTE    [name]  EXN:[id number] DOB: Jun 12, 1946 DOA: 01/06/2021 PCP: [name], MD     Brief Narrative:  [name] is a 75 year old male with medical history of hepatic cirrhosis, portal hypertension, esophageal varices, recurrent anemia, hepatocellular carcinoma status posttreatment in November 2021, CAD s/p CABG, diastolic CHF, EF 49%, CKD stage IIIa, asthma, diabetes mellitus type 2, hypertension, hyperlipidemia presented with shortness of breath and dizziness with complaints of dark stools.  Patient was found to have symptomatic anemia with hemoglobin of 4.8.  He received multiple units of PRBC during this hospitalization.  EGD on 5/13 revealed esophageal varices, acute gastritis, type I gastric varices and portal gastropathy.  IR recommended TIPS and variceal embolization.   New events last 24 hours / Subjective: Patient sitting at the side of the bed, without new complaints.  Denies any vomiting, melena. Awaiting TIPS procedure today.   Assessment & Plan:   Principal Problem:   Symptomatic anemia Active Problems:   Type 2 diabetes with complication (HCC)   Coronary artery disease involving native coronary artery of native heart without angina pectoris   Hyperlipidemia with target LDL less than 70   Hepatic cirrhosis (HCC)   CKD (chronic kidney disease), stage III (HCC)   (HFpEF) heart failure with preserved ejection fraction (HCC)   Recurrent GI bleeding, symptomatic anemia, blood loss anemia, pancytopenia in setting of decompensated cirrhosis -Status post total 5 unit packed red blood cell transfusion so far -Status post EGD on 5/13 which showed esophageal varices, acute gastritis, type I gastric varices and portal gastropathy -GI signed off 5/14 -IR planning for TIPS and variceal embolization 5/18 -Hemoglobin remains stable this morning -Continue octreotide drip -Continue Demadex, Aldactone  Chronic diastolic heart failure -Echocardiogram  showed EF 60 to 67%, grade 3 diastolic dysfunction -Continue Demadex, Aldactone  Diabetes mellitus type 2 -Continue sliding scale insulin  CKD stage IIIa -Baseline creatinine around 1.2 -Remains stable  Hyperlipidemia -Continue Lipitor   DVT prophylaxis:  SCDs Start: 01/06/21 1956  Code Status:     Code Status Orders  (From admission, onward)         Start     Ordered   01/06/21 1956  Full code  Continuous        01/06/21 1956        Code Status History    Date Active Date Inactive Code Status Order ID Comments User Context   11/18/2020 1623 11/23/2020 1939 Full Code [id number]  Harold Hedge, MD ED   10/08/2020 2239 10/10/2020 2352 Full Code [id number]  Elwyn Reach, MD ED   09/10/2020 2305 09/12/2020 1939 Full Code [id number]  Vianne Bulls, MD ED   08/11/2020 0351 08/18/2020 1843 Full Code [id number]  Shela Leff, MD Inpatient   08/10/2020 1957 08/11/2020 0351 DNR [id number]  Lavena Bullion, NP Inpatient   08/09/2020 2216 08/10/2020 1957 Full Code [id number]  Neena Rhymes, MD ED   04/15/2020 2216 04/23/2020 1707 Full Code [id number]  Lenore Cordia, MD ED   12/27/2019 1016 01/04/2020 1733 Full Code [id number]  Vernelle Emerald, MD ED   11/01/2019 0134 11/04/2019 1939 Full Code [id number]  Rhetta Mura, DO ED   07/29/2019 2052 08/07/2019 1608 Full Code [id number]  Lenore Cordia, MD ED   12/24/2018 1128 12/25/2018 1915 Full Code [id number]  Norval Morton, MD ED   12/18/2017 2224 12/22/2017 1431 Full Code [id number]  Vianne Bulls, MD ED   11/20/2017 1951 11/23/2017 1716  Full Code [id number]  Norval Morton, MD ED   09/29/2017 2212 10/05/2017 2026 Full Code [id number]  Norval Morton, MD ED   03/31/2014 1245 04/03/2014 1638 Full Code [id number]  Odis Luster Inpatient   03/28/2014 7106 03/31/2014 1245 Full Code [id number]  Belva Crome, MD Inpatient   03/27/2014 1742 03/28/2014 0903 Full Code [id number]  Rogelia Mire, NP Inpatient   02/17/2014 0313 02/18/2014  1339 Full Code [id number]  Elmarie Shiley, MD Inpatient   08/13/2012 2145 08/17/2012 1438 Full Code [id number]  Almyra Free, RN Inpatient   Advance Care Planning Activity    Advance Directive Documentation   Flowsheet Row Most Recent Value  Type of Advance Directive Healthcare Power of Attorney, Living will  Pre-existing out of facility DNR order (yellow form or pink MOST form) --  "MOST" Form in Place? --     Family Communication: None at bedside Disposition Plan:  Status is: Inpatient  Remains inpatient appropriate because:Inpatient level of care appropriate due to severity of illness   Dispo: The patient is from: Home              Anticipated d/c is to: Home              Patient currently is not medically stable to d/c.   Difficult to place patient No   Antimicrobials:  Anti-infectives (From admission, onward)   Start     Dose/Rate Route Frequency Ordered Stop   01/07/21 1100  [MAR Hold]  cefTRIAXone (ROCEPHIN) 2 g in sodium chloride 0.9 % 100 mL IVPB        (MAR Hold since Wed 01/13/2021 at 1020.Hold Reason: Transfer to a Procedural area.)   2 g 200 mL/hr over 30 Minutes Intravenous Every 24 hours 01/07/21 1010          Objective: Vitals:   01/12/21 0500 01/12/21 1953 01/13/21 0503 01/13/21 0503  BP:  (!) 119/47 126/61 126/61  Pulse:  (!) 55 (!) 53 (!) 53  Resp: 14 18 20 20   Temp:  98.1 F (36.7 C) 98.4 F (36.9 C) 98.4 F (36.9 C)  TempSrc:  Oral Oral Oral  SpO2:  100%  96%  Weight:    105.6 kg  Height:        Intake/Output Summary (Last 24 hours) at 01/13/2021 1152 Last data filed at 01/13/2021 0838 Gross per 24 hour  Intake 800 ml  Output 3400 ml  Net -2600 ml   Filed Weights   01/11/21 0207 01/12/21 0425 01/13/21 0503  Weight: 109.6 kg 108.3 kg 105.6 kg    Examination:  General exam: Appears calm and comfortable  Respiratory system: Clear to auscultation. Respiratory effort normal. No respiratory distress. No conversational dyspnea.   Cardiovascular system: S1 & S2 heard, bradycardic rate 50s, regular rhythm. No murmurs. +pedal edema. Gastrointestinal system: Abdomen is soft and nontender. Normal bowel sounds heard. Central nervous system: Alert and oriented. No focal neurological deficits. Speech clear.  Extremities: Symmetric in appearance  Skin: No rashes, lesions or ulcers on exposed skin  Psychiatry: Judgement and insight appear normal. Mood & affect appropriate.   Data Reviewed: I have personally reviewed following labs and imaging studies  CBC: Recent Labs  Lab 01/09/21 0508 01/10/21 0433 01/11/21 0442 01/12/21 0024 01/13/21 0427  WBC 3.1* 3.2* 2.8* 2.8* 2.7*  NEUTROABS  --   --   --   --  1.8  HGB 7.4* 7.4* 6.7* 7.6* 7.7*  HCT 25.8* 25.8* 22.9* 25.9*  26.0*  MCV 81.1 81.4 80.1 80.7 80.5  PLT 120* 111* 97* 102* 90*   Basic Metabolic Panel: Recent Labs  Lab 01/09/21 0508 01/10/21 0433 01/11/21 0442 01/12/21 0024 01/13/21 0427  NA 132* 137 136 137 138  K 4.3 4.3 4.0 3.8 4.0  CL 103 108 104 103 103  CO2 21* 25 27 27 29   GLUCOSE 137* 116* 101* 134* 117*  BUN 22 23 24* 27* 26*  CREATININE 1.01 1.40* 1.54* 1.40* 1.24  CALCIUM 8.3* 8.4* 7.9* 8.0* 8.3*  MG 2.2 2.3 2.0 2.0 2.1   GFR: Estimated Creatinine Clearance: 61.6 mL/min (by C-G formula based on SCr of 1.24 mg/dL). Liver Function Tests: Recent Labs  Lab 01/06/21 1630 01/07/21 1753 01/12/21 0024 01/13/21 0427  AST 19 18 18 17   ALT 15 14 12 11   ALKPHOS 96 101 82 83  BILITOT 0.7 1.9* 1.1 0.7  PROT 7.1 6.7 5.9* 5.9*  ALBUMIN 3.7 3.6 3.2* 3.2*   No results for input(s): LIPASE, AMYLASE in the last 168 hours. Recent Labs  Lab 01/13/21 0427  AMMONIA 31   Coagulation Profile: Recent Labs  Lab 01/07/21 1753  INR 1.2   Cardiac Enzymes: No results for input(s): CKTOTAL, CKMB, CKMBINDEX, TROPONINI in the last 168 hours. BNP (last 3 results) No results for input(s): PROBNP in the last 8760 hours. HbA1C: No results for input(s):  HGBA1C in the last 72 hours. CBG: Recent Labs  Lab 01/12/21 1144 01/12/21 1633 01/12/21 2030 01/13/21 0621 01/13/21 1052  GLUCAP 188* 160* 107* 117* 126*   Lipid Profile: No results for input(s): CHOL, HDL, LDLCALC, TRIG, CHOLHDL, LDLDIRECT in the last 72 hours. Thyroid Function Tests: No results for input(s): TSH, T4TOTAL, FREET4, T3FREE, THYROIDAB in the last 72 hours. Anemia Panel: No results for input(s): VITAMINB12, FOLATE, FERRITIN, TIBC, IRON, RETICCTPCT in the last 72 hours. Sepsis Labs: No results for input(s): PROCALCITON, LATICACIDVEN in the last 168 hours.  Recent Results (from the past 240 hour(s))  Resp Panel by RT-PCR (Flu A&B, Covid) Nasopharyngeal Swab     Status: None   Collection Time: 01/06/21  4:25 PM   Specimen: Nasopharyngeal Swab; Nasopharyngeal(NP) swabs in vial transport medium  Result Value Ref Range Status   SARS Coronavirus 2 by RT PCR NEGATIVE NEGATIVE Final    Comment: (NOTE) SARS-CoV-2 target nucleic acids are NOT DETECTED.  The SARS-CoV-2 RNA is generally detectable in upper respiratory specimens during the acute phase of infection. The lowest concentration of SARS-CoV-2 viral copies this assay can detect is 138 copies/mL. A negative result does not preclude SARS-Cov-2 infection and should not be used as the sole basis for treatment or other patient management decisions. A negative result may occur with  improper specimen collection/handling, submission of specimen other than nasopharyngeal swab, presence of viral mutation(s) within the areas targeted by this assay, and inadequate number of viral copies(<138 copies/mL). A negative result must be combined with clinical observations, patient history, and epidemiological information. The expected result is Negative.  Fact Sheet for Patients:  EntrepreneurPulse.com.au  Fact Sheet for Healthcare Providers:  IncredibleEmployment.be  This test is no t yet  approved or cleared by the [place] FDA and  has been authorized for detection and/or diagnosis of SARS-CoV-2 by FDA under an Emergency Use Authorization (EUA). This EUA will remain  in effect (meaning this test can be used) for the duration of the COVID-19 declaration under Section 564(b)(1) of the Act, 21 U.S.C.section 360bbb-3(b)(1), unless the authorization is terminated  or revoked sooner.  Influenza A by PCR NEGATIVE NEGATIVE Final   Influenza B by PCR NEGATIVE NEGATIVE Final    Comment: (NOTE) The Xpert Xpress SARS-CoV-2/FLU/RSV plus assay is intended as an aid in the diagnosis of influenza from Nasopharyngeal swab specimens and should not be used as a sole basis for treatment. Nasal washings and aspirates are unacceptable for Xpert Xpress SARS-CoV-2/FLU/RSV testing.  Fact Sheet for Patients: EntrepreneurPulse.com.au  Fact Sheet for Healthcare Providers: IncredibleEmployment.be  This test is not yet approved or cleared by the [place] FDA and has been authorized for detection and/or diagnosis of SARS-CoV-2 by FDA under an Emergency Use Authorization (EUA). This EUA will remain in effect (meaning this test can be used) for the duration of the COVID-19 declaration under Section 564(b)(1) of the Act, 21 U.S.C. section 360bbb-3(b)(1), unless the authorization is terminated or revoked.  Performed at [facility], [place] [address] [place], [place] [address]       Radiology Studies: ECHOCARDIOGRAM COMPLETE  Result Date: 01/12/2021    ECHOCARDIOGRAM REPORT   Patient Name:   [name] [facility] Date of Exam: 01/12/2021 Medical Rec #:  [id number]     Height:       68.0 in Accession #:    [id number]    Weight:       238.8 lb Date of Birth:  February 16, 1946     BSA:          2.204 m Patient Age:    64 years      BP:           99/43 mmHg Patient Gender: M             HR:           53 bpm. Exam Location:  Inpatient Procedure:  2D Echo, Cardiac Doppler and Color Doppler Indications:    CHF and right heart function prior to TIPS procedure  History:        Patient has prior history of Echocardiogram examinations, most                 recent 05/27/2020. CHF, CAD, Defibrillator; Risk                 Factors:Hypertension, Diabetes and Dyslipidemia.  Sonographer:    Luisa Hart RDCS Referring Phys: [place]  1. Left ventricular ejection fraction, by estimation, is 60 to 65%. The left ventricle has normal function. The left ventricle has no regional wall motion abnormalities. There is moderate concentric left ventricular hypertrophy. Left ventricular diastolic parameters are consistent with Grade III diastolic dysfunction (restrictive).  2. Right ventricular systolic function is low normal. The right ventricular size is normal. There is mildly elevated pulmonary artery systolic pressure.  3. The mitral valve is normal in structure. No evidence of mitral valve regurgitation. The mean mitral valve gradient is 4.0 mmHg with average heart rate of 56 bpm.  4. The aortic valve is tricuspid. There is moderate calcification of the aortic valve. Aortic valve regurgitation is mild.  5. The inferior vena cava is dilated in size with <50% respiratory variability, suggesting right atrial pressure of 15 mmHg. Comparison(s): A prior study was performed on 05/27/20. No significant change from prior study. Prior images reviewed side by side. RV is similar. FINDINGS  Left Ventricle: Left ventricular ejection fraction, by estimation, is 60 to 65%. The left ventricle has normal function. The left ventricle has no regional wall motion abnormalities. The left ventricular internal cavity size was normal in  size. There is  moderate concentric left ventricular hypertrophy. Left ventricular diastolic parameters are consistent with Grade III diastolic dysfunction (restrictive). Right Ventricle: The right ventricular size is normal. No increase in right  ventricular wall thickness. Right ventricular systolic function is low normal. There is mildly elevated pulmonary artery systolic pressure. The tricuspid regurgitant velocity is 2.67 m/s, and with an assumed right atrial pressure of 8 mmHg, the estimated right ventricular systolic pressure is 85.2 mmHg. Left Atrium: Left atrial size was normal in size. Right Atrium: Right atrial size was normal in size. Pericardium: There is no evidence of pericardial effusion. Mitral Valve: The mitral valve is normal in structure. No evidence of mitral valve regurgitation. MV peak gradient, 11.7 mmHg. The mean mitral valve gradient is 4.0 mmHg with average heart rate of 56 bpm. Tricuspid Valve: The tricuspid valve is grossly normal. Tricuspid valve regurgitation is not demonstrated. No evidence of tricuspid stenosis. Aortic Valve: The aortic valve is tricuspid. There is moderate calcification of the aortic valve. Aortic valve regurgitation is mild. Aortic regurgitation PHT measures 368 msec. Aortic valve mean gradient measures 8.0 mmHg. Aortic valve peak gradient measures 16.6 mmHg. Aortic valve area, by VTI measures 1.42 cm. Pulmonic Valve: The pulmonic valve was grossly normal. Pulmonic valve regurgitation is trivial. No evidence of pulmonic stenosis. Aorta: The aortic root and ascending aorta are structurally normal, with no evidence of dilitation. Venous: The inferior vena cava is dilated in size with less than 50% respiratory variability, suggesting right atrial pressure of 15 mmHg. IAS/Shunts: The atrial septum is grossly normal.  LEFT VENTRICLE PLAX 2D LVIDd:         4.80 cm      Diastology LVIDs:         2.90 cm      LV e' medial:    7.18 cm/s LV PW:         1.40 cm      LV E/e' medial:  21.0 LV IVS:        1.60 cm      LV e' lateral:   8.70 cm/s LVOT diam:     2.00 cm      LV E/e' lateral: 17.4 LV SV:         73 LV SV Index:   33 LVOT Area:     3.14 cm  LV Volumes (MOD) LV vol d, MOD A2C: 171.0 ml LV vol d, MOD A4C:  127.0 ml LV vol s, MOD A2C: 46.2 ml LV vol s, MOD A4C: 32.6 ml LV SV MOD A2C:     124.8 ml LV SV MOD A4C:     127.0 ml LV SV MOD BP:      106.9 ml RIGHT VENTRICLE RV S prime:     10.20 cm/s  PULMONARY VEINS TAPSE (M-mode): 1.3 cm      A Reversal Duration: 102.00 msec                             A Reversal Velocity: 26.60 cm/s                             Diastolic Velocity:  77.82 cm/s                             S/D Velocity:        0.50  Systolic Velocity:   69.67 cm/s LEFT ATRIUM             Index       RIGHT ATRIUM           Index LA diam:        4.60 cm 2.09 cm/m  RA Area:     15.60 cm LA Vol (A2C):   84.1 ml 38.16 ml/m RA Volume:   35.00 ml  15.88 ml/m LA Vol (A4C):   49.4 ml 22.42 ml/m LA Biplane Vol: 64.4 ml 29.22 ml/m  AORTIC VALVE                    PULMONIC VALVE AV Area (Vmax):    1.59 cm     PV Vmax:       1.09 m/s AV Area (Vmean):   1.60 cm     PV Vmean:      72.700 cm/s AV Area (VTI):     1.42 cm     PV VTI:        0.267 m AV Vmax:           204.00 cm/s  PV Peak grad:  4.8 mmHg AV Vmean:          129.500 cm/s PV Mean grad:  2.0 mmHg AV VTI:            0.510 m AV Peak Grad:      16.6 mmHg AV Mean Grad:      8.0 mmHg LVOT Vmax:         103.00 cm/s LVOT Vmean:        66.100 cm/s LVOT VTI:          0.231 m LVOT/AV VTI ratio: 0.45 AI PHT:            368 msec  AORTA Ao Root diam: 3.60 cm Ao Asc diam:  3.15 cm MITRAL VALVE                TRICUSPID VALVE MV Area (PHT): 2.91 cm     TR Peak grad:   28.5 mmHg MV Area VTI:   1.41 cm     TR Vmax:        267.00 cm/s MV Peak grad:  11.7 mmHg MV Mean grad:  4.0 mmHg     SHUNTS MV Vmax:       1.71 m/s     Systemic VTI:  0.23 m MV Vmean:      93.8 cm/s    Systemic Diam: 2.00 cm MV Decel Time: 261 msec MV E velocity: 151.00 cm/s MV A velocity: 39.80 cm/s MV E/A ratio:  3.79 Rudean Haskell MD Electronically signed by Rudean Haskell MD Signature Date/Time: 01/12/2021/11:34:54 AM    Final       Scheduled Meds: . [MAR  Hold] sodium chloride   Intravenous Once  . sodium chloride   Intravenous Once  . [MAR Hold] atorvastatin  40 mg Oral QHS  . fentaNYL      . [MAR Hold] gabapentin  100 mg Oral BID  . [MAR Hold] insulin aspart  0-6 Units Subcutaneous TID WC  . [MAR Hold] pantoprazole (PROTONIX) IV  40 mg Intravenous Q12H  . [MAR Hold] sodium chloride flush  3 mL Intravenous Q12H  . [MAR Hold] spironolactone  25 mg Oral Daily  . [MAR Hold] torsemide  40 mg Oral BID  . [MAR Hold] umeclidinium-vilanterol  1 puff Inhalation Daily   Continuous Infusions: . [facility]  Hold] sodium chloride Stopped (01/06/21 1728)  . sodium chloride 10 mL/hr at 01/13/21 1112  . [MAR Hold] cefTRIAXone (ROCEPHIN)  IV 2 g (01/12/21 1118)  . octreotide  (SANDOSTATIN)    IV infusion 50 mcg/hr (01/12/21 1731)     LOS: 7 days      Time spent: 30 minutes   Dessa Phi, DO [facility] 01/13/2021, 11:52 AM   Available via Epic secure chat 7am-7pm After these hours, please refer to coverage provider listed on [email or web address]

## 2021-01-13 NOTE — Procedures (Signed)
Interventional Radiology Procedure Note  Procedure:  1) TIPS creation 2) Embolization of gastric varices  Findings: Please refer to procedural dictation for full description.  Right IJ 10 Fr and right CFV 8 Fr access.  8-10 mm, 8+2 cm Viatorr deployed from right hepatic to right posterior portal vein.  Hepatofugal flow into several prominent variceal gastric veins arising from the splenic vein with coil and gelfoam embolization.    Mean pressures (mmHg): Pre RA = 24, Pre Portal = 28, Pre TIPS Portosystemic gradient = 4 Post RA = 24, Post Portal = 29, Post TIPS Portosystemtic gradient = 5  Complications: None immediate  Estimated Blood Loss: < 10 mL  Recommendations: 2 hour bedrest Monitor for hepatic encephalopathy.  Start lactulose now and titrate to 3-4 bowel movements per day. CBC, CMP, INR tomorrow morning. IR will follow.   Ruthann Cancer, MD Pager: [phone]

## 2021-01-13 NOTE — Plan of Care (Signed)
  Problem: Clinical Measurements: Goal: Respiratory complications will improve Outcome: Progressing   Problem: Coping: Goal: Level of anxiety will decrease Outcome: Progressing   

## 2021-01-13 NOTE — Progress Notes (Addendum)
Belonging jewerly, watch and phone is secure.

## 2021-01-13 NOTE — Progress Notes (Signed)
75 yo male s/p TIPS creation.   Patient evaluated post procedure in PACU.  Patient laying in bed with his RN at bedside.  RN reports that patient has been doing fine, complaints of gas in his stomach and has been burping some.   Patient appears somnolent, able to answer questions appropriately.  Reports epigastric pressure and  Tenderness.   VS reviewed, stable.  Positive dressing on right IJ and right femoral puncture sites. Sites are unremarkable with no erythema, edema, tenderness, bleeding or drainage. Dressing clean, dry, and intact.  Bowel sound presents all 4 quadrants.   Monitor for hepatic encephalopathy.   Start lactulose now and titrate to 3-4 bowel movements per day. CBC, CMP, INR tomorrow morning.  Further treatment plan per [facility] Appreciate and agree with the plan. IR to follow.   Armando Gang Wayman Hoard PA-C 01/13/2021 3:53 PM

## 2021-01-14 ENCOUNTER — Encounter (HOSPITAL_COMMUNITY): Payer: Self-pay | Admitting: Interventional Radiology

## 2021-01-14 LAB — COMPREHENSIVE METABOLIC PANEL
ALT: 15 U/L (ref 0–44)
AST: 23 U/L (ref 15–41)
Albumin: 3.4 g/dL — ABNORMAL LOW (ref 3.5–5.0)
Alkaline Phosphatase: 88 U/L (ref 38–126)
Anion gap: 7 (ref 5–15)
BUN: 24 mg/dL — ABNORMAL HIGH (ref 8–23)
CO2: 30 mmol/L (ref 22–32)
Calcium: 8.3 mg/dL — ABNORMAL LOW (ref 8.9–10.3)
Chloride: 101 mmol/L (ref 98–111)
Creatinine, Ser: 1.5 mg/dL — ABNORMAL HIGH (ref 0.61–1.24)
GFR, Estimated: 49 mL/min — ABNORMAL LOW (ref >60)
Glucose, Bld: 118 mg/dL — ABNORMAL HIGH (ref 70–99)
Potassium: 3.9 mmol/L (ref 3.5–5.1)
Sodium: 138 mmol/L (ref 135–145)
Total Bilirubin: 0.9 mg/dL (ref 0.3–1.2)
Total Protein: 6.4 g/dL — ABNORMAL LOW (ref 6.5–8.1)

## 2021-01-14 LAB — CBC
HCT: 27 % — ABNORMAL LOW (ref 39.0–52.0)
Hemoglobin: 7.8 g/dL — ABNORMAL LOW (ref 13.0–17.0)
MCH: 23.2 pg — ABNORMAL LOW (ref 26.0–34.0)
MCHC: 28.9 g/dL — ABNORMAL LOW (ref 30.0–36.0)
MCV: 80.4 fL (ref 80.0–100.0)
Platelets: 87 10*3/uL — ABNORMAL LOW (ref 150–400)
RBC: 3.36 MIL/uL — ABNORMAL LOW (ref 4.22–5.81)
RDW: 20.8 % — ABNORMAL HIGH (ref 11.5–15.5)
WBC: 4.2 10*3/uL (ref 4.0–10.5)
nRBC: 0 % (ref 0.0–0.2)

## 2021-01-14 LAB — GLUCOSE, CAPILLARY
Glucose-Capillary: 116 mg/dL — ABNORMAL HIGH (ref 70–99)
Glucose-Capillary: 151 mg/dL — ABNORMAL HIGH (ref 70–99)
Glucose-Capillary: 163 mg/dL — ABNORMAL HIGH (ref 70–99)
Glucose-Capillary: 126 mg/dL — ABNORMAL HIGH (ref 70–99)

## 2021-01-14 LAB — PROTIME-INR
INR: 1.3 — ABNORMAL HIGH (ref 0.8–1.2)
Prothrombin Time: 16.3 seconds — ABNORMAL HIGH (ref 11.4–15.2)

## 2021-01-14 LAB — AMMONIA: Ammonia: 24 umol/L (ref 9–35)

## 2021-01-14 MED ORDER — LACTULOSE 10 GM/15ML PO SOLN
20.0000 g | Freq: Three times a day (TID) | ORAL | Status: DC
  Administered 2021-01-14 – 2021-01-16 (×8): 20 g via ORAL
  Filled 2021-01-14 (×9): qty 30

## 2021-01-14 NOTE — Anesthesia Postprocedure Evaluation (Signed)
Anesthesia Post Note  Patient: ESEQUIEL KLEINFELTER  Procedure(s) Performed: IR WITH ANESTHESIA - TIPS (N/A )     Patient location during evaluation: PACU Anesthesia Type: General Level of consciousness: awake and alert Pain management: pain level controlled Vital Signs Assessment: post-procedure vital signs reviewed and stable Respiratory status: spontaneous breathing, nonlabored ventilation and respiratory function stable Cardiovascular status: blood pressure returned to baseline and stable Postop Assessment: no apparent nausea or vomiting Anesthetic complications: no   No complications documented.  Last Vitals:  Vitals:   01/13/21 1904 01/14/21 0540  BP: (!) 138/53 (!) 105/41  Pulse: 75 60  Resp: 16 18  Temp: 36.7 C 37 C  SpO2: 98% 94%    Last Pain:  Vitals:   01/14/21 0540  TempSrc: Oral  PainSc:                  Lynda Rainwater

## 2021-01-14 NOTE — Progress Notes (Signed)
PROGRESS NOTE    [name]  CBS:[id number] DOB: December 02, 1945 DOA: 01/06/2021 PCP: [name], MD     Brief Narrative:  [name] is a 75 year old male with medical history of hepatic cirrhosis, portal hypertension, esophageal varices, recurrent anemia, hepatocellular carcinoma status posttreatment in November 2021, CAD s/p CABG, diastolic CHF, EF 84%, CKD stage IIIa, asthma, diabetes mellitus type 2, hypertension, hyperlipidemia presented with shortness of breath and dizziness with complaints of dark stools.  Patient was found to have symptomatic anemia with hemoglobin of 4.8.  He received multiple units of PRBC during this hospitalization.  EGD on 5/13 revealed esophageal varices, acute gastritis, type I gastric varices and portal gastropathy.    Patient underwent TIPS and embolization of gastric varices by IR on 5/18.  New events last 24 hours / Subjective: States that he is feeling well, no physical complaints.  States that he has not had a bowel movement yet since his procedure yesterday.  Instructed patient to continue to keep an eye on bowel movements, our goal is 3-4 bowel movements daily, notified RN of the same.  Assessment & Plan:   Principal Problem:   Symptomatic anemia Active Problems:   Type 2 diabetes with complication (HCC)   Coronary artery disease involving native coronary artery of native heart without angina pectoris   Hyperlipidemia with target LDL less than 70   Hepatic cirrhosis (HCC)   CKD (chronic kidney disease), stage III (HCC)   (HFpEF) heart failure with preserved ejection fraction (HCC)   Recurrent GI bleeding, symptomatic anemia, blood loss anemia, pancytopenia in setting of decompensated cirrhosis -Status post total 5 unit packed red blood cell transfusion so far -Status post EGD on 5/13 which showed esophageal varices, acute gastritis, type I gastric varices and portal gastropathy -GI signed off 5/14 -Status post TIPS, embolization of  gastric varices by IR 5/18 -Hemoglobin remains stable this morning -Continue octreotide drip -Demadex, Aldactone -on hold in setting of elevation in creatinine -Lactulose 3 times daily to maintain 3-4 bowel movements daily -Completed Rocephin for GI ppx   Chronic diastolic heart failure -Echocardiogram showed EF 60 to 66%, grade 3 diastolic dysfunction -Demadex, Aldactone -on hold in setting of elevation in creatinine  Diabetes mellitus type 2 -Continue sliding scale insulin  AKI on CKD stage IIIa -Baseline creatinine around 1.2 -Hold Demadex, Aldactone today  Hyperlipidemia -Continue Lipitor   DVT prophylaxis:  SCDs Start: 01/06/21 1956  Code Status:     Code Status Orders  (From admission, onward)         Start     Ordered   01/06/21 1956  Full code  Continuous        01/06/21 1956        Code Status History    Date Active Date Inactive Code Status Order ID Comments User Context   11/18/2020 1623 11/23/2020 1939 Full Code [id number]  Harold Hedge, MD ED   10/08/2020 2239 10/10/2020 2352 Full Code [id number]  Elwyn Reach, MD ED   09/10/2020 2305 09/12/2020 1939 Full Code [id number]  Vianne Bulls, MD ED   08/11/2020 0351 08/18/2020 1843 Full Code [id number]  Shela Leff, MD Inpatient   08/10/2020 1957 08/11/2020 0351 DNR [id number]  Lavena Bullion, NP Inpatient   08/09/2020 2216 08/10/2020 1957 Full Code [id number]  Neena Rhymes, MD ED   04/15/2020 2216 04/23/2020 1707 Full Code [id number]  Lenore Cordia, MD ED   12/27/2019 1016 01/04/2020 1733 Full Code [id number]  Shalhoub,  Sherryll Burger, MD ED   11/01/2019 0134 11/04/2019 1939 Full Code [id number]  Howerter, Ethelda Chick, DO ED   07/29/2019 2052 08/07/2019 1608 Full Code [id number]  Lenore Cordia, MD ED   12/24/2018 1128 12/25/2018 1915 Full Code [id number]  Norval Morton, MD ED   12/18/2017 2224 12/22/2017 1431 Full Code [id number]  Vianne Bulls, MD ED   11/20/2017 1951 11/23/2017 1716 Full Code [id number]  Norval Morton, MD ED   09/29/2017 2212 10/05/2017 2026 Full Code [id number]  Norval Morton, MD ED   03/31/2014 1245 04/03/2014 1638 Full Code [id number]  John Giovanni, PA-C Inpatient   03/28/2014 0903 03/31/2014 1245 Full Code [id number]  Belva Crome, MD Inpatient   03/27/2014 1742 03/28/2014 0903 Full Code [id number]  Rogelia Mire, NP Inpatient   02/17/2014 0313 02/18/2014 1339 Full Code [id number]  Elmarie Shiley, MD Inpatient   08/13/2012 2145 08/17/2012 1438 Full Code [id number]  Almyra Free, RN Inpatient   Advance Care Planning Activity    Advance Directive Documentation   Flowsheet Row Most Recent Value  Type of Advance Directive Healthcare Power of Attorney, Living will  Pre-existing out of facility DNR order (yellow form or pink MOST form) --  "MOST" Form in Place? --     Family Communication: None at bedside Disposition Plan:  Status is: Inpatient  Remains inpatient appropriate because:Inpatient level of care appropriate due to severity of illness   Dispo: The patient is from: Home              Anticipated d/c is to: Home              Patient currently is not medically stable to d/c.   Difficult to place patient No   Antimicrobials:  Anti-infectives (From admission, onward)   Start     Dose/Rate Route Frequency Ordered Stop   01/07/21 1100  cefTRIAXone (ROCEPHIN) 2 g in sodium chloride 0.9 % 100 mL IVPB        2 g 200 mL/hr over 30 Minutes Intravenous Every 24 hours 01/07/21 1010         Objective: Vitals:   01/14/21 0027 01/14/21 0540 01/14/21 0739 01/14/21 1112  BP:  (!) 105/41  (!) 115/51  Pulse:  60 65 63  Resp:  18 18 20   Temp:  98.6 F (37 C)  98.2 F (36.8 C)  TempSrc:  Oral  Oral  SpO2:  94% 99% 96%  Weight: 104.4 kg     Height:        Intake/Output Summary (Last 24 hours) at 01/14/2021 1231 Last data filed at 01/14/2021 0820 Gross per 24 hour  Intake 3102.17 ml  Output 2485 ml  Net 617.17 ml   Filed Weights   01/12/21 0425 01/13/21 0503  01/14/21 0027  Weight: 108.3 kg 105.6 kg 104.4 kg    Examination: General exam: Appears calm and comfortable  Respiratory system: Clear to auscultation. Respiratory effort normal. Cardiovascular system: S1 & S2 heard, RRR.  Bilateral pitting pedal edema. Gastrointestinal system: Abdomen is nondistended, soft and nontender. Normal bowel sounds heard. Central nervous system: Alert and oriented. Non focal exam. Speech clear  Extremities: Symmetric in appearance bilaterally  Skin: No rashes, lesions or ulcers on exposed skin  Psychiatry: Judgement and insight appear stable. Mood & affect appropriate.   Data Reviewed: I have personally reviewed following labs and imaging studies  CBC: Recent Labs  Lab 01/11/21 0442 01/12/21 0024 01/13/21 0427 01/13/21 1256 01/13/21  1600 01/13/21 1655 01/14/21 0326  WBC 2.8* 2.8* 2.7*  --   --  3.9* 4.2  NEUTROABS  --   --  1.8  --   --   --   --   HGB 6.7* 7.6* 7.7* 9.2* 8.5* 7.9* 7.8*  HCT 22.9* 25.9* 26.0* 27.0* 25.0* 26.8* 27.0*  MCV 80.1 80.7 80.5  --   --  80.2 80.4  PLT 97* 102* 90*  --   --  91* 87*   Basic Metabolic Panel: Recent Labs  Lab 01/09/21 0508 01/10/21 0433 01/11/21 0442 01/12/21 0024 01/13/21 0427 01/13/21 1256 01/13/21 1600 01/14/21 0326  NA 132* 137 136 137 138 142 140 138  K 4.3 4.3 4.0 3.8 4.0 3.7 3.8 3.9  CL 103 108 104 103 103  --  100 101  CO2 21* 25 27 27 29   --   --  30  GLUCOSE 137* 116* 101* 134* 117*  --  140* 118*  BUN 22 23 24* 27* 26*  --  24* 24*  CREATININE 1.01 1.40* 1.54* 1.40* 1.24  --  1.10 1.50*  CALCIUM 8.3* 8.4* 7.9* 8.0* 8.3*  --   --  8.3*  MG 2.2 2.3 2.0 2.0 2.1  --   --   --    GFR: Estimated Creatinine Clearance: 50.6 mL/min (A) (by C-G formula based on SCr of 1.5 mg/dL (H)). Liver Function Tests: Recent Labs  Lab 01/07/21 1753 01/12/21 0024 01/13/21 0427 01/14/21 0326  AST 18 18 17 23   ALT 14 12 11 15   ALKPHOS 101 82 83 88  BILITOT 1.9* 1.1 0.7 0.9  PROT 6.7 5.9* 5.9* 6.4*   ALBUMIN 3.6 3.2* 3.2* 3.4*   No results for input(s): LIPASE, AMYLASE in the last 168 hours. Recent Labs  Lab 01/13/21 0427 01/14/21 0326  AMMONIA 31 24   Coagulation Profile: Recent Labs  Lab 01/07/21 1753 01/14/21 0326  INR 1.2 1.3*   Cardiac Enzymes: No results for input(s): CKTOTAL, CKMB, CKMBINDEX, TROPONINI in the last 168 hours. BNP (last 3 results) No results for input(s): PROBNP in the last 8760 hours. HbA1C: No results for input(s): HGBA1C in the last 72 hours. CBG: Recent Labs  Lab 01/13/21 1513 01/13/21 1620 01/13/21 2102 01/14/21 0638 01/14/21 1114  GLUCAP 146* 145* 191* 116* 151*   Lipid Profile: No results for input(s): CHOL, HDL, LDLCALC, TRIG, CHOLHDL, LDLDIRECT in the last 72 hours. Thyroid Function Tests: No results for input(s): TSH, T4TOTAL, FREET4, T3FREE, THYROIDAB in the last 72 hours. Anemia Panel: No results for input(s): VITAMINB12, FOLATE, FERRITIN, TIBC, IRON, RETICCTPCT in the last 72 hours. Sepsis Labs: No results for input(s): PROCALCITON, LATICACIDVEN in the last 168 hours.  Recent Results (from the past 240 hour(s))  Resp Panel by RT-PCR (Flu A&B, Covid) Nasopharyngeal Swab     Status: None   Collection Time: 01/06/21  4:25 PM   Specimen: Nasopharyngeal Swab; Nasopharyngeal(NP) swabs in vial transport medium  Result Value Ref Range Status   SARS Coronavirus 2 by RT PCR NEGATIVE NEGATIVE Final    Comment: (NOTE) SARS-CoV-2 target nucleic acids are NOT DETECTED.  The SARS-CoV-2 RNA is generally detectable in upper respiratory specimens during the acute phase of infection. The lowest concentration of SARS-CoV-2 viral copies this assay can detect is 138 copies/mL. A negative result does not preclude SARS-Cov-2 infection and should not be used as the sole basis for treatment or other patient management decisions. A negative result may occur with  improper specimen collection/handling, submission  of specimen other than  nasopharyngeal swab, presence of viral mutation(s) within the areas targeted by this assay, and inadequate number of viral copies(<138 copies/mL). A negative result must be combined with clinical observations, patient history, and epidemiological information. The expected result is Negative.  Fact Sheet for Patients:  EntrepreneurPulse.com.au  Fact Sheet for Healthcare Providers:  IncredibleEmployment.be  This test is no t yet approved or cleared by the [place] FDA and  has been authorized for detection and/or diagnosis of SARS-CoV-2 by FDA under an Emergency Use Authorization (EUA). This EUA will remain  in effect (meaning this test can be used) for the duration of the COVID-19 declaration under Section 564(b)(1) of the Act, 21 U.S.C.section 360bbb-3(b)(1), unless the authorization is terminated  or revoked sooner.       Influenza A by PCR NEGATIVE NEGATIVE Final   Influenza B by PCR NEGATIVE NEGATIVE Final    Comment: (NOTE) The Xpert Xpress SARS-CoV-2/FLU/RSV plus assay is intended as an aid in the diagnosis of influenza from Nasopharyngeal swab specimens and should not be used as a sole basis for treatment. Nasal washings and aspirates are unacceptable for Xpert Xpress SARS-CoV-2/FLU/RSV testing.  Fact Sheet for Patients: EntrepreneurPulse.com.au  Fact Sheet for Healthcare Providers: IncredibleEmployment.be  This test is not yet approved or cleared by the [place] FDA and has been authorized for detection and/or diagnosis of SARS-CoV-2 by FDA under an Emergency Use Authorization (EUA). This EUA will remain in effect (meaning this test can be used) for the duration of the COVID-19 declaration under Section 564(b)(1) of the Act, 21 U.S.C. section 360bbb-3(b)(1), unless the authorization is terminated or revoked.  Performed at [facility], [place] [address] [place], [place] [address]       Radiology Studies: IR Tips  Result Date: 01/13/2021 CLINICAL DATA:  75 year old male with history of cirrhosis (Child Pugh A, MELD-Na 11) with associated hemorrhagic gastroesophageal varices, history of HCC status post TARE segmentectomy (November 2021) without evidence of recurrence or new disease. Currently stable without evidence of active hemorrhage. EXAM: 1. Ultrasound-guided access of the right common femoral vein. 2. Intravascular ultrasound. 3. Ultrasound-guided access of the right internal jugular vein. 4. Selective catheterization of the right hepatic vein. 5. Portosystemic manometry. 6. Transjugular intrahepatic portosystemic shunt creation. 7. Selective catheterization of multiple variceal gastric veins. 8. Coil embolization of multiple variceal gastric veins. MEDICATIONS: The patient was receiving ceftriaxone as an inpatient which was administered approximately 1 hour prior to the procedure. ANESTHESIA/SEDATION: General - as administered by the Anesthesia department CONTRAST:  130 mL Omnipaque 300, intravenous FLUOROSCOPY TIME:  Fluoroscopy Time: 31 minutes 48 seconds (1,216 mGy). COMPLICATIONS: None immediate. PROCEDURE: Informed written consent was obtained from the patient after a thorough discussion of the procedural risks, benefits and alternatives. All questions were addressed. Maximal Sterile Barrier Technique was utilized including caps, mask, sterile gowns, sterile gloves, sterile drape, hand hygiene and skin antiseptic. A timeout was performed prior to the initiation of the procedure. The right neck and right groin were prepped and draped in standard fashion. Preprocedure ultrasound evaluation of the right common femoral vein demonstrated a patent vessel that was compressible and free of internal echoes. A permanent ultrasound image was stored. A small skin nick was made at the planned needle entry site. Under direct ultrasound visualization, a 21  gauge micropuncture needle was directed into the common femoral vein. After insertion of a micropuncture set, serial dilation was performed over a Rosen wire and an 11 cm,  8 [place] vascular sheath was placed. Through this 8 French sheath, under direct fluoroscopic visualization, an intracardiac echocardiography intravascular ultrasound probe was advanced to the level of the perihepatic IVC. Intravascular ultrasound demonstrated a patent portal system in close proximity to the right hepatic vein, appropriate anatomy for TIPS creation. Ultrasound evaluation of the right internal jugular vein demonstrated patent vessel that was compressible and free of internal echoes. A permanent ultrasound image was stored. A small skin nick was made at the planned needle entry site. Under direct ultrasound visualization, a 21 gauge micropuncture needle was directed into the right internal jugular vein. A micropuncture set was placed through which a Rosen wire was directed to the inferior vena cava under fluoroscopic guidance. Serial dilation was performed and a 10 French tips sheath was then placed with the tip in the right atrium. Right atrial pressure was then measured (mean 24 mm Hg). Using fluoroscopic guidance, a 5 French angled tip catheter was then directed into the right hepatic vein. Steep right anterior oblique fluoroscopic image confirmed placement in the right hepatic vein. Limited hand injection right hepatic venogram demonstrated wide patency and antegrade flow into the right atrium. The sheath was advanced over the catheter into the right hepatic vein. Using direct intravascular ultrasound guidance, the [place] needle was directed from the right hepatic vein into the right portal vein with a single throw. Intravascular position was confirmed with aspiration of portal venous blood. Hand injection of contrast demonstrated appropriate puncture site. A glidewire was then directed into the splenic vein. A marking pigtail  catheter was inserted and positioned at the portal confluence. Portal venogram demonstrated patent antegrade flow through the patent main, left, and right portal systems. There was a prominent left gastric and several accessory gastric veins with severe gastric varices noted. Portal manometry was performed (mean portal pressure 28 mm mercury, pre TIPS portosystemic gradient equals 4 mm Hg). The intrahepatic tract was then dilated with an 8 x 4 mustang balloon. The 10 French sheath was then advanced to the level of the main portal vein. An 8-10 mm, 8+2 cm Viatorr endograft was then positioned and the portal end was uncovered. The gold ring was positioned at the portal vein entry site. The stent was then fully deployed under fluoroscopic guidance. The pigtail catheter was then reinserted with the pigtail portion formed in the main portal vein. Portal venogram demonstrated patent TIPS stent and decreased but persistent retrograde flow into the gastric varices. Portosystemic manometry was again measured (right atrium mean 24 mmHg, portal vein 29 mmHg, gradient 5 mmHg). Given the elevated right heart pressures, post-deployment dilation was not pursued. The pigtail catheter was exchanged for a 5 Fr angle tip catheter which was used to selectively catheterize the most prominent gastric vein arising from the splenic vein, likely the left gastric. Injection of contrast demonstrated hepatofugal flow with multiple tortuous varicosities near the fundus of the stomach. Three separate gastric veins were selected and embolized with various sizes of POD microcoils via a Lantern microcatheter. The pigtail catheter was reinserted into the portal vein and completion portal venogram was performed which demonstrated widely patent TIPS stent and minimal retrograde filling of embolized gastric varices. The indwelling pigtail catheter was then removed over a wire and the ICE catheter was removed. The right IJ and right common femoral vein  sheath were then removed and manual compression was applied for 5 minutes with adequate hemostasis. Sterile bandages were applied. The patient was extubated and transferred to the postanesthesia care unit in  stable condition. IMPRESSION: 1. Technically successful transjugular intrahepatic portosystemic shunt creation. 2. 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 3. Elevated right heart pressures (mean of 24 mmHg) pre- and post-TIPS creation with minimal portosystemic pressure gradient. Ruthann Cancer, MD [facility] Radiology Electronically Signed   By: Ruthann Cancer MD   On: 01/13/2021 16:54   IR US Guide Vasc Access Right  Result Date: 01/13/2021 CLINICAL DATA:  75 year old male with history of cirrhosis (Child Pugh A, MELD-Na 11) with associated hemorrhagic gastroesophageal varices, history of HCC status post TARE segmentectomy (November 2021) without evidence of recurrence or new disease. Currently stable without evidence of active hemorrhage. EXAM: 1. Ultrasound-guided access of the right common femoral vein. 2. Intravascular ultrasound. 3. Ultrasound-guided access of the right internal jugular vein. 4. Selective catheterization of the right hepatic vein. 5. Portosystemic manometry. 6. Transjugular intrahepatic portosystemic shunt creation. 7. Selective catheterization of multiple variceal gastric veins. 8. Coil embolization of multiple variceal gastric veins. MEDICATIONS: The patient was receiving ceftriaxone as an inpatient which was administered approximately 1 hour prior to the procedure. ANESTHESIA/SEDATION: General - as administered by the Anesthesia department CONTRAST:  130 mL Omnipaque 300, intravenous FLUOROSCOPY TIME:  Fluoroscopy Time: 31 minutes 48 seconds (1,216 mGy). COMPLICATIONS: None immediate. PROCEDURE: Informed written consent was obtained from the patient after a  thorough discussion of the procedural risks, benefits and alternatives. All questions were addressed. Maximal Sterile Barrier Technique was utilized including caps, mask, sterile gowns, sterile gloves, sterile drape, hand hygiene and skin antiseptic. A timeout was performed prior to the initiation of the procedure. The right neck and right groin were prepped and draped in standard fashion. Preprocedure ultrasound evaluation of the right common femoral vein demonstrated a patent vessel that was compressible and free of internal echoes. A permanent ultrasound image was stored. A small skin nick was made at the planned needle entry site. Under direct ultrasound visualization, a 21 gauge micropuncture needle was directed into the common femoral vein. After insertion of a micropuncture set, serial dilation was performed over a Rosen wire and an 11 cm, 8 French vascular sheath was placed. Through this 8 French sheath, under direct fluoroscopic visualization, an intracardiac echocardiography intravascular ultrasound probe was advanced to the level of the perihepatic IVC. Intravascular ultrasound demonstrated a patent portal system in close proximity to the right hepatic vein, appropriate anatomy for TIPS creation. Ultrasound evaluation of the right internal jugular vein demonstrated patent vessel that was compressible and free of internal echoes. A permanent ultrasound image was stored. A small skin nick was made at the planned needle entry site. Under direct ultrasound visualization, a 21 gauge micropuncture needle was directed into the right internal jugular vein. A micropuncture set was placed through which a Rosen wire was directed to the inferior vena cava under fluoroscopic guidance. Serial dilation was performed and a 10 French tips sheath was then placed with the tip in the right atrium. Right atrial pressure was then measured (mean 24 mm Hg). Using fluoroscopic guidance, a 5 French angled tip catheter was then  directed into the right hepatic vein. Steep right anterior oblique fluoroscopic image confirmed placement in the right hepatic vein. Limited hand injection right hepatic venogram demonstrated wide patency and antegrade flow into the right atrium. The sheath was advanced over the catheter into the right hepatic vein. Using direct intravascular ultrasound guidance, the [place] needle was directed from the right hepatic vein into  the right portal vein with a single throw. Intravascular position was confirmed with aspiration of portal venous blood. Hand injection of contrast demonstrated appropriate puncture site. A glidewire was then directed into the splenic vein. A marking pigtail catheter was inserted and positioned at the portal confluence. Portal venogram demonstrated patent antegrade flow through the patent main, left, and right portal systems. There was a prominent left gastric and several accessory gastric veins with severe gastric varices noted. Portal manometry was performed (mean portal pressure 28 mm mercury, pre TIPS portosystemic gradient equals 4 mm Hg). The intrahepatic tract was then dilated with an 8 x 4 mustang balloon. The 10 French sheath was then advanced to the level of the main portal vein. An 8-10 mm, 8+2 cm Viatorr endograft was then positioned and the portal end was uncovered. The gold ring was positioned at the portal vein entry site. The stent was then fully deployed under fluoroscopic guidance. The pigtail catheter was then reinserted with the pigtail portion formed in the main portal vein. Portal venogram demonstrated patent TIPS stent and decreased but persistent retrograde flow into the gastric varices. Portosystemic manometry was again measured (right atrium mean 24 mmHg, portal vein 29 mmHg, gradient 5 mmHg). Given the elevated right heart pressures, post-deployment dilation was not pursued. The pigtail catheter was exchanged for a 5 Fr angle tip catheter which was used to  selectively catheterize the most prominent gastric vein arising from the splenic vein, likely the left gastric. Injection of contrast demonstrated hepatofugal flow with multiple tortuous varicosities near the fundus of the stomach. Three separate gastric veins were selected and embolized with various sizes of POD microcoils via a Lantern microcatheter. The pigtail catheter was reinserted into the portal vein and completion portal venogram was performed which demonstrated widely patent TIPS stent and minimal retrograde filling of embolized gastric varices. The indwelling pigtail catheter was then removed over a wire and the ICE catheter was removed. The right IJ and right common femoral vein sheath were then removed and manual compression was applied for 5 minutes with adequate hemostasis. Sterile bandages were applied. The patient was extubated and transferred to the postanesthesia care unit in stable condition. IMPRESSION: 1. Technically successful transjugular intrahepatic portosystemic shunt creation. 2. 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 3. Elevated right heart pressures (mean of 24 mmHg) pre- and post-TIPS creation with minimal portosystemic pressure gradient. Ruthann Cancer, MD [facility] Radiology Electronically Signed   By: Ruthann Cancer MD   On: 01/13/2021 16:54   IR IVUS EACH ADDITIONAL NON CORONARY VESSEL  Result Date: 01/13/2021 CLINICAL DATA:  75 year old male with history of cirrhosis (Child Pugh A, MELD-Na 11) with associated hemorrhagic gastroesophageal varices, history of [place] status post TARE segmentectomy (November 2021) without evidence of recurrence or new disease. Currently stable without evidence of active hemorrhage. EXAM: 1. Ultrasound-guided access of the right common femoral vein. 2. Intravascular ultrasound. 3. Ultrasound-guided access of the right internal  jugular vein. 4. Selective catheterization of the right hepatic vein. 5. Portosystemic manometry. 6. Transjugular intrahepatic portosystemic shunt creation. 7. Selective catheterization of multiple variceal gastric veins. 8. Coil embolization of multiple variceal gastric veins. MEDICATIONS: The patient was receiving ceftriaxone as an inpatient which was administered approximately 1 hour prior to the procedure. ANESTHESIA/SEDATION: General - as administered by the Anesthesia department CONTRAST:  130 mL Omnipaque 300, intravenous FLUOROSCOPY TIME:  Fluoroscopy Time: 31 minutes 48 seconds (1,216 mGy). COMPLICATIONS:  None immediate. PROCEDURE: Informed written consent was obtained from the patient after a thorough discussion of the procedural risks, benefits and alternatives. All questions were addressed. Maximal Sterile Barrier Technique was utilized including caps, mask, sterile gowns, sterile gloves, sterile drape, hand hygiene and skin antiseptic. A timeout was performed prior to the initiation of the procedure. The right neck and right groin were prepped and draped in standard fashion. Preprocedure ultrasound evaluation of the right common femoral vein demonstrated a patent vessel that was compressible and free of internal echoes. A permanent ultrasound image was stored. A small skin nick was made at the planned needle entry site. Under direct ultrasound visualization, a 21 gauge micropuncture needle was directed into the common femoral vein. After insertion of a micropuncture set, serial dilation was performed over a Rosen wire and an 11 cm, 8 French vascular sheath was placed. Through this 8 French sheath, under direct fluoroscopic visualization, an intracardiac echocardiography intravascular ultrasound probe was advanced to the level of the perihepatic IVC. Intravascular ultrasound demonstrated a patent portal system in close proximity to the right hepatic vein, appropriate anatomy for TIPS creation. Ultrasound  evaluation of the right internal jugular vein demonstrated patent vessel that was compressible and free of internal echoes. A permanent ultrasound image was stored. A small skin nick was made at the planned needle entry site. Under direct ultrasound visualization, a 21 gauge micropuncture needle was directed into the right internal jugular vein. A micropuncture set was placed through which a Rosen wire was directed to the inferior vena cava under fluoroscopic guidance. Serial dilation was performed and a 10 French tips sheath was then placed with the tip in the right atrium. Right atrial pressure was then measured (mean 24 mm Hg). Using fluoroscopic guidance, a 5 French angled tip catheter was then directed into the right hepatic vein. Steep right anterior oblique fluoroscopic image confirmed placement in the right hepatic vein. Limited hand injection right hepatic venogram demonstrated wide patency and antegrade flow into the right atrium. The sheath was advanced over the catheter into the right hepatic vein. Using direct intravascular ultrasound guidance, the [place] needle was directed from the right hepatic vein into the right portal vein with a single throw. Intravascular position was confirmed with aspiration of portal venous blood. Hand injection of contrast demonstrated appropriate puncture site. A glidewire was then directed into the splenic vein. A marking pigtail catheter was inserted and positioned at the portal confluence. Portal venogram demonstrated patent antegrade flow through the patent main, left, and right portal systems. There was a prominent left gastric and several accessory gastric veins with severe gastric varices noted. Portal manometry was performed (mean portal pressure 28 mm mercury, pre TIPS portosystemic gradient equals 4 mm Hg). The intrahepatic tract was then dilated with an 8 x 4 mustang balloon. The 10 French sheath was then advanced to the level of the main portal vein. An 8-10  mm, 8+2 cm Viatorr endograft was then positioned and the portal end was uncovered. The gold ring was positioned at the portal vein entry site. The stent was then fully deployed under fluoroscopic guidance. The pigtail catheter was then reinserted with the pigtail portion formed in the main portal vein. Portal venogram demonstrated patent TIPS stent and decreased but persistent retrograde flow into the gastric varices. Portosystemic manometry was again measured (right atrium mean 24 mmHg, portal vein 29 mmHg, gradient 5 mmHg). Given the elevated right heart pressures, post-deployment dilation was not pursued. The pigtail catheter was exchanged for a  5 Fr angle tip catheter which was used to selectively catheterize the most prominent gastric vein arising from the splenic vein, likely the left gastric. Injection of contrast demonstrated hepatofugal flow with multiple tortuous varicosities near the fundus of the stomach. Three separate gastric veins were selected and embolized with various sizes of POD microcoils via a Lantern microcatheter. The pigtail catheter was reinserted into the portal vein and completion portal venogram was performed which demonstrated widely patent TIPS stent and minimal retrograde filling of embolized gastric varices. The indwelling pigtail catheter was then removed over a wire and the ICE catheter was removed. The right IJ and right common femoral vein sheath were then removed and manual compression was applied for 5 minutes with adequate hemostasis. Sterile bandages were applied. The patient was extubated and transferred to the postanesthesia care unit in stable condition. IMPRESSION: 1. Technically successful transjugular intrahepatic portosystemic shunt creation. 2. 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 3. Elevated right heart pressures (mean of 24 mmHg) pre- and post-TIPS creation with minimal portosystemic  pressure gradient. Ruthann Cancer, MD [facility] Radiology Electronically Signed   By: Ruthann Cancer MD   On: 01/13/2021 16:54   IR EMBO VENOUS NOT HEMORR HEMANG  INC GUIDE ROADMAPPING  Result Date: 01/13/2021 CLINICAL DATA:  75 year old male with history of cirrhosis (Child Pugh A, MELD-Na 11) with associated hemorrhagic gastroesophageal varices, history of HCC status post TARE segmentectomy (November 2021) without evidence of recurrence or new disease. Currently stable without evidence of active hemorrhage. EXAM: 1. Ultrasound-guided access of the right common femoral vein. 2. Intravascular ultrasound. 3. Ultrasound-guided access of the right internal jugular vein. 4. Selective catheterization of the right hepatic vein. 5. Portosystemic manometry. 6. Transjugular intrahepatic portosystemic shunt creation. 7. Selective catheterization of multiple variceal gastric veins. 8. Coil embolization of multiple variceal gastric veins. MEDICATIONS: The patient was receiving ceftriaxone as an inpatient which was administered approximately 1 hour prior to the procedure. ANESTHESIA/SEDATION: General - as administered by the Anesthesia department CONTRAST:  130 mL Omnipaque 300, intravenous FLUOROSCOPY TIME:  Fluoroscopy Time: 31 minutes 48 seconds (1,216 mGy). COMPLICATIONS: None immediate. PROCEDURE: Informed written consent was obtained from the patient after a thorough discussion of the procedural risks, benefits and alternatives. All questions were addressed. Maximal Sterile Barrier Technique was utilized including caps, mask, sterile gowns, sterile gloves, sterile drape, hand hygiene and skin antiseptic. A timeout was performed prior to the initiation of the procedure. The right neck and right groin were prepped and draped in standard fashion. Preprocedure ultrasound evaluation of the right common femoral vein demonstrated a patent vessel that was compressible and free of  internal echoes. A permanent ultrasound image was stored. A small skin nick was made at the planned needle entry site. Under direct ultrasound visualization, a 21 gauge micropuncture needle was directed into the common femoral vein. After insertion of a micropuncture set, serial dilation was performed over a Rosen wire and an 11 cm, 8 French vascular sheath was placed. Through this 8 French sheath, under direct fluoroscopic visualization, an intracardiac echocardiography intravascular ultrasound probe was advanced to the level of the perihepatic IVC. Intravascular ultrasound demonstrated a patent portal system in close proximity to the right hepatic vein, appropriate anatomy for TIPS creation. Ultrasound evaluation of the right internal jugular vein demonstrated patent vessel that was compressible and free of internal echoes. A permanent ultrasound image was stored. A small skin nick was made  at the planned needle entry site. Under direct ultrasound visualization, a 21 gauge micropuncture needle was directed into the right internal jugular vein. A micropuncture set was placed through which a Rosen wire was directed to the inferior vena cava under fluoroscopic guidance. Serial dilation was performed and a 10 French tips sheath was then placed with the tip in the right atrium. Right atrial pressure was then measured (mean 24 mm Hg). Using fluoroscopic guidance, a 5 French angled tip catheter was then directed into the right hepatic vein. Steep right anterior oblique fluoroscopic image confirmed placement in the right hepatic vein. Limited hand injection right hepatic venogram demonstrated wide patency and antegrade flow into the right atrium. The sheath was advanced over the catheter into the right hepatic vein. Using direct intravascular ultrasound guidance, the [place] needle was directed from the right hepatic vein into the right portal vein with a single throw. Intravascular position was confirmed with  aspiration of portal venous blood. Hand injection of contrast demonstrated appropriate puncture site. A glidewire was then directed into the splenic vein. A marking pigtail catheter was inserted and positioned at the portal confluence. Portal venogram demonstrated patent antegrade flow through the patent main, left, and right portal systems. There was a prominent left gastric and several accessory gastric veins with severe gastric varices noted. Portal manometry was performed (mean portal pressure 28 mm mercury, pre TIPS portosystemic gradient equals 4 mm Hg). The intrahepatic tract was then dilated with an 8 x 4 mustang balloon. The 10 French sheath was then advanced to the level of the main portal vein. An 8-10 mm, 8+2 cm Viatorr endograft was then positioned and the portal end was uncovered. The gold ring was positioned at the portal vein entry site. The stent was then fully deployed under fluoroscopic guidance. The pigtail catheter was then reinserted with the pigtail portion formed in the main portal vein. Portal venogram demonstrated patent TIPS stent and decreased but persistent retrograde flow into the gastric varices. Portosystemic manometry was again measured (right atrium mean 24 mmHg, portal vein 29 mmHg, gradient 5 mmHg). Given the elevated right heart pressures, post-deployment dilation was not pursued. The pigtail catheter was exchanged for a 5 Fr angle tip catheter which was used to selectively catheterize the most prominent gastric vein arising from the splenic vein, likely the left gastric. Injection of contrast demonstrated hepatofugal flow with multiple tortuous varicosities near the fundus of the stomach. Three separate gastric veins were selected and embolized with various sizes of POD microcoils via a Lantern microcatheter. The pigtail catheter was reinserted into the portal vein and completion portal venogram was performed which demonstrated widely patent TIPS stent and minimal retrograde  filling of embolized gastric varices. The indwelling pigtail catheter was then removed over a wire and the ICE catheter was removed. The right IJ and right common femoral vein sheath were then removed and manual compression was applied for 5 minutes with adequate hemostasis. Sterile bandages were applied. The patient was extubated and transferred to the postanesthesia care unit in stable condition. IMPRESSION: 1. Technically successful transjugular intrahepatic portosystemic shunt creation. 2. 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 3. Elevated right heart pressures (mean of 24 mmHg) pre- and post-TIPS creation with minimal portosystemic pressure gradient. Ruthann Cancer, MD [facility] Radiology Electronically Signed   By: Ruthann Cancer MD   On: 01/13/2021 16:54      Scheduled Meds: . sodium chloride  Intravenous Once  . sodium chloride   Intravenous Once  . atorvastatin  40 mg Oral QHS  . gabapentin  100 mg Oral BID  . insulin aspart  0-6 Units Subcutaneous TID WC  . lactulose  10 g Oral TID  . pantoprazole (PROTONIX) IV  40 mg Intravenous Q12H  . sodium chloride flush  3 mL Intravenous Q12H  . umeclidinium-vilanterol  1 puff Inhalation Daily   Continuous Infusions: . sodium chloride Stopped (01/06/21 1728)  . cefTRIAXone (ROCEPHIN)  IV 2 g (01/14/21 1126)  . octreotide  (SANDOSTATIN)    IV infusion 50 mcg/hr (01/13/21 1523)     LOS: 8 days      Time spent: 40 minutes   Dessa Phi, DO [facility] 01/14/2021, 12:31 PM   Available via Epic secure chat 7am-7pm After these hours, please refer to coverage provider listed on [email or web address]

## 2021-01-14 NOTE — Progress Notes (Signed)
  Referring Physician(s): Choi,J/Schooler,V  Supervising Physician: Wagner, Jaime  Patient Status:  [facility] - In-pt  Chief Complaint: Abdominal fullness, constipation   Subjective: Pt doing ok ; remains constipated; denies fever,HA,CP, dyspnea, cough, worsening abd/back pain,N/V or bleeding; is passing flatus   Allergies: Fluzone quadrivalent [influenza vac split quad]  Medications: Prior to Admission medications   Medication Sig Start Date End Date Taking? Authorizing Provider  albuterol (VENTOLIN HFA) 108 (90 Base) MCG/ACT inhaler Inhale 2 puffs into the lungs every 6 (six) hours as needed for wheezing or shortness of breath. 03/12/20  Yes Crawford, Elizabeth A, MD  atorvastatin (LIPITOR) 40 MG tablet TAKE 1 TABLET BY MOUTH DAILY AT 6 PM Patient taking differently: Take 40 mg by mouth daily at 6 PM. 01/04/21  Yes Crawford, Elizabeth A, MD  gabapentin (NEURONTIN) 100 MG capsule TAKE 1 CAPSULE(100 MG) BY MOUTH TWICE DAILY Patient taking differently: Take 100 mg by mouth 2 (two) times daily. 08/03/20  Yes Crawford, Elizabeth A, MD  glimepiride (AMARYL) 2 MG tablet TAKE 1 TABLET(2 MG) BY MOUTH DAILY BEFORE BREAKFAST Patient taking differently: Take 2 mg by mouth daily with breakfast. 12/16/20  Yes Crawford, Elizabeth A, MD  hydrocortisone (ANUSOL-HC) 2.5 % rectal cream Place 1 application rectally 2 (two) times daily. Patient taking differently: Place 1 application rectally 2 (two) times daily as needed for hemorrhoids. 12/04/20  Yes Crawford, Elizabeth A, MD  nitroGLYCERIN (NITROSTAT) 0.4 MG SL tablet Place 1 tablet (0.4 mg total) under the tongue every 5 (five) minutes as needed for chest pain. 06/08/16  Yes Nahser, Philip J, MD  polyethylene glycol powder (GLYCOLAX/MIRALAX) 17 GM/SCOOP powder Take 8.5 g by mouth in the morning, at noon, and at bedtime.   Yes [provider]  spironolactone (ALDACTONE) 25 MG tablet Take 1 tablet (25 mg total) by mouth daily. 08/18/20  Yes Danford,  Christopher P, MD  sucralfate (CARAFATE) 1 g tablet Take 1 tablet (1 g total) by mouth 4 (four) times daily. Patient taking differently: Take 2 g by mouth in the morning and at bedtime. 11/23/20 12/23/20 Yes Powell, A Caldwell Jr., MD  torsemide (DEMADEX) 20 MG tablet Take 2 tablets (40 mg total) by mouth 2 (two) times daily. 08/18/20  Yes Danford, Christopher P, MD  umeclidinium-vilanterol (ANORO ELLIPTA) 62.5-25 MCG/INH AEPB Inhale 1 puff into the lungs daily. 03/12/20  Yes Crawford, Elizabeth A, MD  Accu-Chek Softclix Lancets lancets USE AS DIRECTED TO TEST BLOOD SUGAR FOUR TIMES DAILY 03/13/20   Crawford, Elizabeth A, MD  blood glucose meter kit and supplies Dispense based on patient and insurance preference. Use up to four times daily as directed. (FOR ICD-10 E10.9, E11.9). 03/12/20   Crawford, Elizabeth A, MD  metFORMIN (GLUCOPHAGE) 850 MG tablet TAKE 1 TABLET(850 MG) BY MOUTH TWICE DAILY WITH A MEAL 01/08/21   Crawford, Elizabeth A, MD  pantoprazole (PROTONIX) 40 MG tablet Take 1 tablet (40 mg total) by mouth 2 (two) times daily. Patient not taking: No sig reported 09/12/20   Pokhrel, Laxman, MD  potassium chloride (KLOR-CON) 20 MEQ tablet Take 1 tablet (20 mEq total) by mouth daily. Patient not taking: No sig reported 08/18/20   Danford, Christopher P, MD  Psyllium (METAMUCIL PO) Take 15 mLs by mouth 3 (three) times daily with meals. Mix with 4 oz of juice or water    [provider]     Vital Signs: BP (!) 115/51 (BP Location: Right Arm)   Pulse 63   Temp 98.2 F (36.8 C) (  Oral)   Resp 20   Ht 5' 8" (1.727 m)   Wt 230 lb 3.2 oz (104.4 kg) Comment: scale a  SpO2 96%   BMI 35.00 kg/m   Physical Exam awake, alert; puncture sites rt IJ/right groin soft, clean and dry,NT; abd soft, not sig tender  Imaging: IR Tips  Result Date: 01/13/2021 CLINICAL DATA:  74 year old male with history of cirrhosis (Child Pugh A, MELD-Na 11) with associated hemorrhagic gastroesophageal varices,  history of HCC status post TARE segmentectomy (November 2021) without evidence of recurrence or new disease. Currently stable without evidence of active hemorrhage. EXAM: 1. Ultrasound-guided access of the right common femoral vein. 2. Intravascular ultrasound. 3. Ultrasound-guided access of the right internal jugular vein. 4. Selective catheterization of the right hepatic vein. 5. Portosystemic manometry. 6. Transjugular intrahepatic portosystemic shunt creation. 7. Selective catheterization of multiple variceal gastric veins. 8. Coil embolization of multiple variceal gastric veins. MEDICATIONS: The patient was receiving ceftriaxone as an inpatient which was administered approximately 1 hour prior to the procedure. ANESTHESIA/SEDATION: General - as administered by the Anesthesia department CONTRAST:  130 mL Omnipaque 300, intravenous FLUOROSCOPY TIME:  Fluoroscopy Time: 31 minutes 48 seconds (1,216 mGy). COMPLICATIONS: None immediate. PROCEDURE: Informed written consent was obtained from the patient after a thorough discussion of the procedural risks, benefits and alternatives. All questions were addressed. Maximal Sterile Barrier Technique was utilized including caps, mask, sterile gowns, sterile gloves, sterile drape, hand hygiene and skin antiseptic. A timeout was performed prior to the initiation of the procedure. The right neck and right groin were prepped and draped in standard fashion. Preprocedure ultrasound evaluation of the right common femoral vein demonstrated a patent vessel that was compressible and free of internal echoes. A permanent ultrasound image was stored. A small skin nick was made at the planned needle entry site. Under direct ultrasound visualization, a 21 gauge micropuncture needle was directed into the common femoral vein. After insertion of a micropuncture set, serial dilation was performed over a Rosen wire and an 11 cm, 8 French vascular sheath was placed. Through this 8 French sheath,  under direct fluoroscopic visualization, an intracardiac echocardiography intravascular ultrasound probe was advanced to the level of the perihepatic IVC. Intravascular ultrasound demonstrated a patent portal system in close proximity to the right hepatic vein, appropriate anatomy for TIPS creation. Ultrasound evaluation of the right internal jugular vein demonstrated patent vessel that was compressible and free of internal echoes. A permanent ultrasound image was stored. A small skin nick was made at the planned needle entry site. Under direct ultrasound visualization, a 21 gauge micropuncture needle was directed into the right internal jugular vein. A micropuncture set was placed through which a Rosen wire was directed to the inferior vena cava under fluoroscopic guidance. Serial dilation was performed and a 10 French tips sheath was then placed with the tip in the right atrium. Right atrial pressure was then measured (mean 24 mm Hg). Using fluoroscopic guidance, a 5 French angled tip catheter was then directed into the right hepatic vein. Steep right anterior oblique fluoroscopic image confirmed placement in the right hepatic vein. Limited hand injection right hepatic venogram demonstrated wide patency and antegrade flow into the right atrium. The sheath was advanced over the catheter into the right hepatic vein. Using direct intravascular ultrasound guidance, the [place] needle was directed from the right hepatic vein into the right portal vein with a single throw. Intravascular position was confirmed with aspiration of portal venous blood. Hand injection of   contrast demonstrated appropriate puncture site. A glidewire was then directed into the splenic vein. A marking pigtail catheter was inserted and positioned at the portal confluence. Portal venogram demonstrated patent antegrade flow through the patent main, left, and right portal systems. There was a prominent left gastric and several accessory gastric  veins with severe gastric varices noted. Portal manometry was performed (mean portal pressure 28 mm mercury, pre TIPS portosystemic gradient equals 4 mm Hg). The intrahepatic tract was then dilated with an 8 x 4 mustang balloon. The 10 French sheath was then advanced to the level of the main portal vein. An 8-10 mm, 8+2 cm Viatorr endograft was then positioned and the portal end was uncovered. The gold ring was positioned at the portal vein entry site. The stent was then fully deployed under fluoroscopic guidance. The pigtail catheter was then reinserted with the pigtail portion formed in the main portal vein. Portal venogram demonstrated patent TIPS stent and decreased but persistent retrograde flow into the gastric varices. Portosystemic manometry was again measured (right atrium mean 24 mmHg, portal vein 29 mmHg, gradient 5 mmHg). Given the elevated right heart pressures, post-deployment dilation was not pursued. The pigtail catheter was exchanged for a 5 Fr angle tip catheter which was used to selectively catheterize the most prominent gastric vein arising from the splenic vein, likely the left gastric. Injection of contrast demonstrated hepatofugal flow with multiple tortuous varicosities near the fundus of the stomach. Three separate gastric veins were selected and embolized with various sizes of POD microcoils via a Lantern microcatheter. The pigtail catheter was reinserted into the portal vein and completion portal venogram was performed which demonstrated widely patent TIPS stent and minimal retrograde filling of embolized gastric varices. The indwelling pigtail catheter was then removed over a wire and the ICE catheter was removed. The right IJ and right common femoral vein sheath were then removed and manual compression was applied for 5 minutes with adequate hemostasis. Sterile bandages were applied. The patient was extubated and transferred to the postanesthesia care unit in stable condition.  IMPRESSION: 1. Technically successful transjugular intrahepatic portosystemic shunt creation. 2. 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 3. Elevated right heart pressures (mean of 24 mmHg) pre- and post-TIPS creation with minimal portosystemic pressure gradient. Ruthann Cancer, MD [facility] Electronically Signed   By: Ruthann Cancer MD   On: 01/13/2021 16:54   IR US Guide Vasc Access Right  Result Date: 01/13/2021 CLINICAL DATA:  74 year old male with history of cirrhosis (Child Pugh A, MELD-Na 11) with associated hemorrhagic gastroesophageal varices, history of HCC status post TARE segmentectomy (November 2021) without evidence of recurrence or new disease. Currently stable without evidence of active hemorrhage. EXAM: 1. Ultrasound-guided access of the right common femoral vein. 2. Intravascular ultrasound. 3. Ultrasound-guided access of the right internal jugular vein. 4. Selective catheterization of the right hepatic vein. 5. Portosystemic manometry. 6. Transjugular intrahepatic portosystemic shunt creation. 7. Selective catheterization of multiple variceal gastric veins. 8. Coil embolization of multiple variceal gastric veins. MEDICATIONS: The patient was receiving ceftriaxone as an inpatient which was administered approximately 1 hour prior to the procedure. ANESTHESIA/SEDATION: General - as administered by the Anesthesia department CONTRAST:  130 mL Omnipaque 300, intravenous FLUOROSCOPY TIME:  Fluoroscopy Time: 31 minutes 48 seconds (1,216 mGy). COMPLICATIONS: None immediate. PROCEDURE: Informed written consent was obtained from the patient after a thorough discussion of the procedural risks, benefits and alternatives.  All questions were addressed. Maximal Sterile Barrier Technique was utilized including caps, mask, sterile gowns, sterile gloves, sterile drape, hand hygiene  and skin antiseptic. A timeout was performed prior to the initiation of the procedure. The right neck and right groin were prepped and draped in standard fashion. Preprocedure ultrasound evaluation of the right common femoral vein demonstrated a patent vessel that was compressible and free of internal echoes. A permanent ultrasound image was stored. A small skin nick was made at the planned needle entry site. Under direct ultrasound visualization, a 21 gauge micropuncture needle was directed into the common femoral vein. After insertion of a micropuncture set, serial dilation was performed over a Rosen wire and an 11 cm, 8 French vascular sheath was placed. Through this 8 French sheath, under direct fluoroscopic visualization, an intracardiac echocardiography intravascular ultrasound probe was advanced to the level of the perihepatic IVC. Intravascular ultrasound demonstrated a patent portal system in close proximity to the right hepatic vein, appropriate anatomy for TIPS creation. Ultrasound evaluation of the right internal jugular vein demonstrated patent vessel that was compressible and free of internal echoes. A permanent ultrasound image was stored. A small skin nick was made at the planned needle entry site. Under direct ultrasound visualization, a 21 gauge micropuncture needle was directed into the right internal jugular vein. A micropuncture set was placed through which a Rosen wire was directed to the inferior vena cava under fluoroscopic guidance. Serial dilation was performed and a 10 French tips sheath was then placed with the tip in the right atrium. Right atrial pressure was then measured (mean 24 mm Hg). Using fluoroscopic guidance, a 5 French angled tip catheter was then directed into the right hepatic vein. Steep right anterior oblique fluoroscopic image confirmed placement in the right hepatic vein. Limited hand injection right hepatic venogram demonstrated wide patency and antegrade flow into the  right atrium. The sheath was advanced over the catheter into the right hepatic vein. Using direct intravascular ultrasound guidance, the Col[place] needle was directed from the right hepatic vein into the right portal vein with a single throw. Intravascular position was confirmed with aspiration of portal venous blood. Hand injection of contrast demonstrated appropriate puncture site. A glidewire was then directed into the splenic vein. A marking pigtail catheter was inserted and positioned at the portal confluence. Portal venogram demonstrated patent antegrade flow through the patent main, left, and right portal systems. There was a prominent left gastric and several accessory gastric veins with severe gastric varices noted. Portal manometry was performed (mean portal pressure 28 mm mercury, pre TIPS portosystemic gradient equals 4 mm Hg). The intrahepatic tract was then dilated with an 8 x 4 mustang balloon. The 10 French sheath was then advanced to the level of the main portal vein. An 8-10 mm, 8+2 cm Viatorr endograft was then positioned and the portal end was uncovered. The gold ring was positioned at the portal vein entry site. The stent was then fully deployed under fluoroscopic guidance. The pigtail catheter was then reinserted with the pigtail portion formed in the main portal vein. Portal venogram demonstrated patent TIPS stent and decreased but persistent retrograde flow into the gastric varices. Portosystemic manometry was again measured (right atrium mean 24 mmHg, portal vein 29 mmHg, gradient 5 mmHg). Given the elevated right heart pressures, post-deployment dilation was not pursued. The pigtail catheter was exchanged for a 5 Fr angle tip catheter which was used to selectively catheterize the most prominent gastric vein arising from the splenic vein, likely   the left gastric. Injection of contrast demonstrated hepatofugal flow with multiple tortuous varicosities near the fundus of the stomach. Three  separate gastric veins were selected and embolized with various sizes of POD microcoils via a Lantern microcatheter. The pigtail catheter was reinserted into the portal vein and completion portal venogram was performed which demonstrated widely patent TIPS stent and minimal retrograde filling of embolized gastric varices. The indwelling pigtail catheter was then removed over a wire and the ICE catheter was removed. The right IJ and right common femoral vein sheath were then removed and manual compression was applied for 5 minutes with adequate hemostasis. Sterile bandages were applied. The patient was extubated and transferred to the postanesthesia care unit in stable condition. IMPRESSION: 1. Technically successful transjugular intrahepatic portosystemic shunt creation. 2. 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 3. Elevated right heart pressures (mean of 24 mmHg) pre- and post-TIPS creation with minimal portosystemic pressure gradient. Dylan Suttle, MD [facility] [place] [facility] Electronically Signed   By: Dylan  Suttle MD   On: 01/13/2021 16:54   ECHOCARDIOGRAM COMPLETE  Result Date: 01/12/2021    ECHOCARDIOGRAM REPORT   Patient Name:   [name] Date of Exam: 01/12/2021 Medical Rec #:  [id number]     Height:       68.0 in Accession #:    [id number]    Weight:       238.8 lb Date of Birth:  04/09/1946     BSA:          2.204 m Patient Age:    74 years      BP:           99/43 mmHg Patient Gender: M             HR:           53 bpm. Exam Location:  Inpatient Procedure: 2D Echo, Cardiac Doppler and Color Doppler Indications:    CHF and right heart function prior to TIPS procedure  History:        Patient has prior history of Echocardiogram examinations, most                 recent 05/27/2020. CHF, CAD, Defibrillator; Risk                 Factors:Hypertension, Diabetes and Dyslipidemia.   Sonographer:    TAMARA CROWN RDCS Referring Phys: [id number] KEVIN BRUNING IMPRESSIONS  1. Left ventricular ejection fraction, by estimation, is 60 to 65%. The left ventricle has normal function. The left ventricle has no regional wall motion abnormalities. There is moderate concentric left ventricular hypertrophy. Left ventricular diastolic parameters are consistent with Grade III diastolic dysfunction (restrictive).  2. Right ventricular systolic function is low normal. The right ventricular size is normal. There is mildly elevated pulmonary artery systolic pressure.  3. The mitral valve is normal in structure. No evidence of mitral valve regurgitation. The mean mitral valve gradient is 4.0 mmHg with average heart rate of 56 bpm.  4. The aortic valve is tricuspid. There is moderate calcification of the aortic valve. Aortic valve regurgitation is mild.  5. The inferior vena cava is dilated in size with <50% respiratory variability, suggesting right atrial pressure of 15 mmHg. Comparison(s): A prior study was performed on 05/27/20. No significant change from prior study. Prior images reviewed side by side. RV is similar. FINDINGS  Left Ventricle: Left ventricular ejection fraction, by estimation, is   60 to 65%. The left ventricle has normal function. The left ventricle has no regional wall motion abnormalities. The left ventricular internal cavity size was normal in size. There is  moderate concentric left ventricular hypertrophy. Left ventricular diastolic parameters are consistent with Grade III diastolic dysfunction (restrictive). Right Ventricle: The right ventricular size is normal. No increase in right ventricular wall thickness. Right ventricular systolic function is low normal. There is mildly elevated pulmonary artery systolic pressure. The tricuspid regurgitant velocity is 2.67 m/s, and with an assumed right atrial pressure of 8 mmHg, the estimated right ventricular systolic pressure is 68.1 mmHg. Left Atrium:  Left atrial size was normal in size. Right Atrium: Right atrial size was normal in size. Pericardium: There is no evidence of pericardial effusion. Mitral Valve: The mitral valve is normal in structure. No evidence of mitral valve regurgitation. MV peak gradient, 11.7 mmHg. The mean mitral valve gradient is 4.0 mmHg with average heart rate of 56 bpm. Tricuspid Valve: The tricuspid valve is grossly normal. Tricuspid valve regurgitation is not demonstrated. No evidence of tricuspid stenosis. Aortic Valve: The aortic valve is tricuspid. There is moderate calcification of the aortic valve. Aortic valve regurgitation is mild. Aortic regurgitation PHT measures 368 msec. Aortic valve mean gradient measures 8.0 mmHg. Aortic valve peak gradient measures 16.6 mmHg. Aortic valve area, by VTI measures 1.42 cm. Pulmonic Valve: The pulmonic valve was grossly normal. Pulmonic valve regurgitation is trivial. No evidence of pulmonic stenosis. Aorta: The aortic root and ascending aorta are structurally normal, with no evidence of dilitation. Venous: The inferior vena cava is dilated in size with less than 50% respiratory variability, suggesting right atrial pressure of 15 mmHg. IAS/Shunts: The atrial septum is grossly normal.  LEFT VENTRICLE PLAX 2D LVIDd:         4.80 cm      Diastology LVIDs:         2.90 cm      LV e' medial:    7.18 cm/s LV PW:         1.40 cm      LV E/e' medial:  21.0 LV IVS:        1.60 cm      LV e' lateral:   8.70 cm/s LVOT diam:     2.00 cm      LV E/e' lateral: 17.4 LV SV:         73 LV SV Index:   33 LVOT Area:     3.14 cm  LV Volumes (MOD) LV vol d, MOD A2C: 171.0 ml LV vol d, MOD A4C: 127.0 ml LV vol s, MOD A2C: 46.2 ml LV vol s, MOD A4C: 32.6 ml LV SV MOD A2C:     124.8 ml LV SV MOD A4C:     127.0 ml LV SV MOD BP:      106.9 ml RIGHT VENTRICLE RV S prime:     10.20 cm/s  PULMONARY VEINS TAPSE (M-mode): 1.3 cm      A Reversal Duration: 102.00 msec                             A Reversal Velocity: 26.60  cm/s                             Diastolic Velocity:  15.72 cm/s  S/D Velocity:        0.50                             Systolic Velocity:   37.70 cm/s LEFT ATRIUM             Index       RIGHT ATRIUM           Index LA diam:        4.60 cm 2.09 cm/m  RA Area:     15.60 cm LA Vol (A2C):   84.1 ml 38.16 ml/m RA Volume:   35.00 ml  15.88 ml/m LA Vol (A4C):   49.4 ml 22.42 ml/m LA Biplane Vol: 64.4 ml 29.22 ml/m  AORTIC VALVE                    PULMONIC VALVE AV Area (Vmax):    1.59 cm     PV Vmax:       1.09 m/s AV Area (Vmean):   1.60 cm     PV Vmean:      72.700 cm/s AV Area (VTI):     1.42 cm     PV VTI:        0.267 m AV Vmax:           204.00 cm/s  PV Peak grad:  4.8 mmHg AV Vmean:          129.500 cm/s PV Mean grad:  2.0 mmHg AV VTI:            0.510 m AV Peak Grad:      16.6 mmHg AV Mean Grad:      8.0 mmHg LVOT Vmax:         103.00 cm/s LVOT Vmean:        66.100 cm/s LVOT VTI:          0.231 m LVOT/AV VTI ratio: 0.45 AI PHT:            368 msec  AORTA Ao Root diam: 3.60 cm Ao Asc diam:  3.15 cm MITRAL VALVE                TRICUSPID VALVE MV Area (PHT): 2.91 cm     TR Peak grad:   28.5 mmHg MV Area VTI:   1.41 cm     TR Vmax:        267.00 cm/s MV Peak grad:  11.7 mmHg MV Mean grad:  4.0 mmHg     SHUNTS MV Vmax:       1.71 m/s     Systemic VTI:  0.23 m MV Vmean:      93.8 cm/s    Systemic Diam: 2.00 cm MV Decel Time: 261 msec MV E velocity: 151.00 cm/s MV A velocity: 39.80 cm/s MV E/A ratio:  3.79 Mahesh Chandrasekhar MD Electronically signed by Mahesh Chandrasekhar MD Signature Date/Time: 01/12/2021/11:34:54 AM    Final    IR IVUS EACH ADDITIONAL NON CORONARY VESSEL  Result Date: 01/13/2021 CLINICAL DATA:  74 year old male with history of cirrhosis (Child Pugh A, MELD-Na 11) with associated hemorrhagic gastroesophageal varices, history of HCC status post TARE segmentectomy (November 2021) without evidence of recurrence or new disease. Currently stable without evidence  of active hemorrhage. EXAM: 1. Ultrasound-guided access of the right common femoral vein. 2. Intravascular ultrasound. 3. Ultrasound-guided access of the right internal jugular vein. 4. Selective catheterization of the right   hepatic vein. 5. Portosystemic manometry. 6. Transjugular intrahepatic portosystemic shunt creation. 7. Selective catheterization of multiple variceal gastric veins. 8. Coil embolization of multiple variceal gastric veins. MEDICATIONS: The patient was receiving ceftriaxone as an inpatient which was administered approximately 1 hour prior to the procedure. ANESTHESIA/SEDATION: General - as administered by the Anesthesia department CONTRAST:  130 mL Omnipaque 300, intravenous FLUOROSCOPY TIME:  Fluoroscopy Time: 31 minutes 48 seconds (1,216 mGy). COMPLICATIONS: None immediate. PROCEDURE: Informed written consent was obtained from the patient after a thorough discussion of the procedural risks, benefits and alternatives. All questions were addressed. Maximal Sterile Barrier Technique was utilized including caps, mask, sterile gowns, sterile gloves, sterile drape, hand hygiene and skin antiseptic. A timeout was performed prior to the initiation of the procedure. The right neck and right groin were prepped and draped in standard fashion. Preprocedure ultrasound evaluation of the right common femoral vein demonstrated a patent vessel that was compressible and free of internal echoes. A permanent ultrasound image was stored. A small skin nick was made at the planned needle entry site. Under direct ultrasound visualization, a 21 gauge micropuncture needle was directed into the common femoral vein. After insertion of a micropuncture set, serial dilation was performed over a Rosen wire and an 11 cm, 8 French vascular sheath was placed. Through this 8 French sheath, under direct fluoroscopic visualization, an intracardiac echocardiography intravascular ultrasound probe was advanced to the level of the  perihepatic IVC. Intravascular ultrasound demonstrated a patent portal system in close proximity to the right hepatic vein, appropriate anatomy for TIPS creation. Ultrasound evaluation of the right internal jugular vein demonstrated patent vessel that was compressible and free of internal echoes. A permanent ultrasound image was stored. A small skin nick was made at the planned needle entry site. Under direct ultrasound visualization, a 21 gauge micropuncture needle was directed into the right internal jugular vein. A micropuncture set was placed through which a Rosen wire was directed to the inferior vena cava under fluoroscopic guidance. Serial dilation was performed and a 10 French tips sheath was then placed with the tip in the right atrium. Right atrial pressure was then measured (mean 24 mm Hg). Using fluoroscopic guidance, a 5 French angled tip catheter was then directed into the right hepatic vein. Steep right anterior oblique fluoroscopic image confirmed placement in the right hepatic vein. Limited hand injection right hepatic venogram demonstrated wide patency and antegrade flow into the right atrium. The sheath was advanced over the catheter into the right hepatic vein. Using direct intravascular ultrasound guidance, the [place] needle was directed from the right hepatic vein into the right portal vein with a single throw. Intravascular position was confirmed with aspiration of portal venous blood. Hand injection of contrast demonstrated appropriate puncture site. A glidewire was then directed into the splenic vein. A marking pigtail catheter was inserted and positioned at the portal confluence. Portal venogram demonstrated patent antegrade flow through the patent main, left, and right portal systems. There was a prominent left gastric and several accessory gastric veins with severe gastric varices noted. Portal manometry was performed (mean portal pressure 28 mm mercury, pre TIPS portosystemic gradient  equals 4 mm Hg). The intrahepatic tract was then dilated with an 8 x 4 mustang balloon. The 10 French sheath was then advanced to the level of the main portal vein. An 8-10 mm, 8+2 cm Viatorr endograft was then positioned and the portal end was uncovered. The gold ring was positioned at the portal vein entry site. The stent  was then fully deployed under fluoroscopic guidance. The pigtail catheter was then reinserted with the pigtail portion formed in the main portal vein. Portal venogram demonstrated patent TIPS stent and decreased but persistent retrograde flow into the gastric varices. Portosystemic manometry was again measured (right atrium mean 24 mmHg, portal vein 29 mmHg, gradient 5 mmHg). Given the elevated right heart pressures, post-deployment dilation was not pursued. The pigtail catheter was exchanged for a 5 Fr angle tip catheter which was used to selectively catheterize the most prominent gastric vein arising from the splenic vein, likely the left gastric. Injection of contrast demonstrated hepatofugal flow with multiple tortuous varicosities near the fundus of the stomach. Three separate gastric veins were selected and embolized with various sizes of POD microcoils via a Lantern microcatheter. The pigtail catheter was reinserted into the portal vein and completion portal venogram was performed which demonstrated widely patent TIPS stent and minimal retrograde filling of embolized gastric varices. The indwelling pigtail catheter was then removed over a wire and the ICE catheter was removed. The right IJ and right common femoral vein sheath were then removed and manual compression was applied for 5 minutes with adequate hemostasis. Sterile bandages were applied. The patient was extubated and transferred to the postanesthesia care unit in stable condition. IMPRESSION: 1. Technically successful transjugular intrahepatic portosystemic shunt creation. 2. Technically successful coil embolization of three  separate variceal gastric veins arising from the splenic vein. 3. Elevated right heart pressures (mean of 24 mmHg) pre- and post-TIPS creation with minimal portosystemic pressure gradient. Ruthann Cancer, MD [facility] Radiology Electronically Signed   By: Ruthann Cancer MD   On: 01/13/2021 16:54   IR EMBO VENOUS NOT HEMORR HEMANG  INC GUIDE ROADMAPPING  Result Date: 01/13/2021 CLINICAL DATA:  75 year old male with history of cirrhosis (Child Pugh A, MELD-Na 11) with associated hemorrhagic gastroesophageal varices, history of HCC status post TARE segmentectomy (November 2021) without evidence of recurrence or new disease. Currently stable without evidence of active hemorrhage. EXAM: 1. Ultrasound-guided access of the right common femoral vein. 2. Intravascular ultrasound. 3. Ultrasound-guided access of the right internal jugular vein. 4. Selective catheterization of the right hepatic vein. 5. Portosystemic manometry. 6. Transjugular intrahepatic portosystemic shunt creation. 7. Selective catheterization of multiple variceal gastric veins. 8. Coil embolization of multiple variceal gastric veins. MEDICATIONS: The patient was receiving ceftriaxone as an inpatient which was administered approximately 1 hour prior to the procedure. ANESTHESIA/SEDATION: General - as administered by the Anesthesia department CONTRAST:  130 mL Omnipaque 300, intravenous FLUOROSCOPY TIME:  Fluoroscopy Time: 31 minutes 48 seconds (1,216 mGy). COMPLICATIONS: None immediate. PROCEDURE: Informed written consent was obtained from the patient after a thorough discussion of the procedural risks, benefits and alternatives. All questions were addressed. Maximal Sterile Barrier Technique was utilized including caps, mask, sterile gowns, sterile gloves, sterile drape, hand hygiene and skin antiseptic. A timeout was performed prior to the initiation of the procedure. The right neck and right groin were  prepped and draped in standard fashion. Preprocedure ultrasound evaluation of the right common femoral vein demonstrated a patent vessel that was compressible and free of internal echoes. A permanent ultrasound image was stored. A small skin nick was made at the planned needle entry site. Under direct ultrasound visualization, a 21 gauge micropuncture needle was directed into the common femoral vein. After insertion of a micropuncture set, serial dilation was performed over a Rosen wire and an 11 cm, 8 French vascular sheath was placed. Through  this 8 [place] sheath, under direct fluoroscopic visualization, an intracardiac echocardiography intravascular ultrasound probe was advanced to the level of the perihepatic IVC. Intravascular ultrasound demonstrated a patent portal system in close proximity to the right hepatic vein, appropriate anatomy for TIPS creation. Ultrasound evaluation of the right internal jugular vein demonstrated patent vessel that was compressible and free of internal echoes. A permanent ultrasound image was stored. A small skin nick was made at the planned needle entry site. Under direct ultrasound visualization, a 21 gauge micropuncture needle was directed into the right internal jugular vein. A micropuncture set was placed through which a Rosen wire was directed to the inferior vena cava under fluoroscopic guidance. Serial dilation was performed and a 10 French tips sheath was then placed with the tip in the right atrium. Right atrial pressure was then measured (mean 24 mm Hg). Using fluoroscopic guidance, a 5 French angled tip catheter was then directed into the right hepatic vein. Steep right anterior oblique fluoroscopic image confirmed placement in the right hepatic vein. Limited hand injection right hepatic venogram demonstrated wide patency and antegrade flow into the right atrium. The sheath was advanced over the catheter into the right hepatic vein. Using direct intravascular ultrasound  guidance, the Colopinto needle was directed from the right hepatic vein into the right portal vein with a single throw. Intravascular position was confirmed with aspiration of portal venous blood. Hand injection of contrast demonstrated appropriate puncture site. A glidewire was then directed into the splenic vein. A marking pigtail catheter was inserted and positioned at the portal confluence. Portal venogram demonstrated patent antegrade flow through the patent main, left, and right portal systems. There was a prominent left gastric and several accessory gastric veins with severe gastric varices noted. Portal manometry was performed (mean portal pressure 28 mm mercury, pre TIPS portosystemic gradient equals 4 mm Hg). The intrahepatic tract was then dilated with an 8 x 4 mustang balloon. The 10 French sheath was then advanced to the level of the main portal vein. An 8-10 mm, 8+2 cm Viatorr endograft was then positioned and the portal end was uncovered. The gold ring was positioned at the portal vein entry site. The stent was then fully deployed under fluoroscopic guidance. The pigtail catheter was then reinserted with the pigtail portion formed in the main portal vein. Portal venogram demonstrated patent TIPS stent and decreased but persistent retrograde flow into the gastric varices. Portosystemic manometry was again measured (right atrium mean 24 mmHg, portal vein 29 mmHg, gradient 5 mmHg). Given the elevated right heart pressures, post-deployment dilation was not pursued. The pigtail catheter was exchanged for a 5 Fr angle tip catheter which was used to selectively catheterize the most prominent gastric vein arising from the splenic vein, likely the left gastric. Injection of contrast demonstrated hepatofugal flow with multiple tortuous varicosities near the fundus of the stomach. Three separate gastric veins were selected and embolized with various sizes of POD microcoils via a Lantern microcatheter. The  pigtail catheter was reinserted into the portal vein and completion portal venogram was performed which demonstrated widely patent TIPS stent and minimal retrograde filling of embolized gastric varices. The indwelling pigtail catheter was then removed over a wire and the ICE catheter was removed. The right IJ and right common femoral vein sheath were then removed and manual compression was applied for 5 minutes with adequate hemostasis. Sterile bandages were applied. The patient was extubated and transferred to the postanesthesia care unit in stable condition. IMPRESSION: 1. Technically successful transjugular  intrahepatic portosystemic shunt creation. 2. 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 3. Elevated right heart pressures (mean of 24 mmHg) pre- and post-TIPS creation with minimal portosystemic pressure gradient. Ruthann Cancer, MD [facility] Radiology Electronically Signed   By: Ruthann Cancer MD   On: 01/13/2021 16:54    Labs:  CBC: Recent Labs    01/12/21 0024 01/13/21 0427 01/13/21 1256 01/13/21 1600 01/13/21 1655 01/14/21 0326  WBC 2.8* 2.7*  --   --  3.9* 4.2  HGB 7.6* 7.7* 9.2* 8.5* 7.9* 7.8*  HCT 25.9* 26.0* 27.0* 25.0* 26.8* 27.0*  PLT 102* 90*  --   --  91* 87*    COAGS: Recent Labs    10/09/20 1039 11/18/20 1324 01/07/21 1753 01/14/21 0326  INR 1.2 1.2 1.2 1.3*    BMP: Recent Labs    04/22/20 0953 04/23/20 0417 06/01/20 1414 06/16/20 0845 08/25/20 1420 09/10/20 1442 01/11/21 0442 01/12/21 0024 01/13/21 0427 01/13/21 1256 01/13/21 1600 01/14/21 0326  NA 137 139 141   < > 139   < > 136 137 138 142 140 138  K 4.4 4.0 3.7   < > 3.8   < > 4.0 3.8 4.0 3.7 3.8 3.9  CL 103 103 102   < > 102   < > 104 103 103  --  100 101  CO2 _0 < > 24   < > _1 --   --  30  GLUCOSE 190* 105* 149*   < > 209*   < > 101* 134* 117*  --  140* 118*   BUN 16 19 24*   < > 26   < > 24* 27* 26*  --  24* 24*  CALCIUM 9.1 8.7* 8.5*   < > 8.7   < > 7.9* 8.0* 8.3*  --   --  8.3*  CREATININE 1.27* 1.49* 1.06   < > 1.35*   < > 1.54* 1.40* 1.24  --  1.10 1.50*  GFRNONAA 55* 46* >60   < > 51*   < > 47* 53* >60  --   --  49*  GFRAA >60 53* >60  --  59*  --   --   --   --   --   --   --    < > = values in this interval not displayed.    LIVER FUNCTION TESTS: Recent Labs    01/07/21 1753 01/12/21 0024 01/13/21 0427 01/14/21 0326  BILITOT 1.9* 1.1 0.7 0.9  AST _2 ALT _3 ALKPHOS 101 82 83 88  PROT 6.7 5.9* 5.9* 6.4*  ALBUMIN 3.6 3.2* 3.2* 3.4*    Assessment and Plan: Pt with hx cirrhosis, hem GE varices, HCC with prior TARE segmentectomy 2021; s/p TIPS, coil embo of 3 separate variceal gastric veins arising from splenic vein on 5/18; afebrile; WBC nl; hgb 7.8(7.9), creat 1.50, ammonia WNL; t bili nl; primary team to increase lactulose dose per nurse to aid constipation; also consider prune juice, ? suppository; will schedule pt for f/u TIPS [place] in 1 month   Electronically Signed: D. Rowe Robert, PA-C 01/14/2021, 3:31 PM   I spent a total of 15 minutes at the the patient's bedside AND on the patient's hospital floor or unit, greater than 50% of which was counseling/coordinating care for TIPS, coil embolization of gastric varices  Patient ID: [name], male   DOB: [birth year], 75 y.o.   MRN: [id number]

## 2021-01-15 LAB — CBC
HCT: 25.9 % — ABNORMAL LOW (ref 39.0–52.0)
Hemoglobin: 7.4 g/dL — ABNORMAL LOW (ref 13.0–17.0)
MCH: 23.1 pg — ABNORMAL LOW (ref 26.0–34.0)
MCHC: 28.6 g/dL — ABNORMAL LOW (ref 30.0–36.0)
MCV: 80.9 fL (ref 80.0–100.0)
Platelets: 70 10*3/uL — ABNORMAL LOW (ref 150–400)
RBC: 3.2 MIL/uL — ABNORMAL LOW (ref 4.22–5.81)
RDW: 20.7 % — ABNORMAL HIGH (ref 11.5–15.5)
WBC: 3.5 10*3/uL — ABNORMAL LOW (ref 4.0–10.5)
nRBC: 0 % (ref 0.0–0.2)

## 2021-01-15 LAB — COMPREHENSIVE METABOLIC PANEL
ALT: 16 U/L (ref 0–44)
AST: 24 U/L (ref 15–41)
Albumin: 3.3 g/dL — ABNORMAL LOW (ref 3.5–5.0)
Alkaline Phosphatase: 112 U/L (ref 38–126)
Anion gap: 5 (ref 5–15)
BUN: 23 mg/dL (ref 8–23)
CO2: 29 mmol/L (ref 22–32)
Calcium: 8.6 mg/dL — ABNORMAL LOW (ref 8.9–10.3)
Chloride: 103 mmol/L (ref 98–111)
Creatinine, Ser: 1.36 mg/dL — ABNORMAL HIGH (ref 0.61–1.24)
GFR, Estimated: 55 mL/min — ABNORMAL LOW (ref >60)
Glucose, Bld: 127 mg/dL — ABNORMAL HIGH (ref 70–99)
Potassium: 4 mmol/L (ref 3.5–5.1)
Sodium: 137 mmol/L (ref 135–145)
Total Bilirubin: 1 mg/dL (ref 0.3–1.2)
Total Protein: 5.9 g/dL — ABNORMAL LOW (ref 6.5–8.1)

## 2021-01-15 LAB — GLUCOSE, CAPILLARY
Glucose-Capillary: 135 mg/dL — ABNORMAL HIGH (ref 70–99)
Glucose-Capillary: 155 mg/dL — ABNORMAL HIGH (ref 70–99)
Glucose-Capillary: 155 mg/dL — ABNORMAL HIGH (ref 70–99)
Glucose-Capillary: 181 mg/dL — ABNORMAL HIGH (ref 70–99)

## 2021-01-15 LAB — AMMONIA: Ammonia: 61 umol/L — ABNORMAL HIGH (ref 9–35)

## 2021-01-15 MED ORDER — GLYCERIN (LAXATIVE) 2 G RE SUPP
1.0000 | Freq: Once | RECTAL | Status: AC
  Administered 2021-01-15: 1 via RECTAL
  Filled 2021-01-15 (×3): qty 1

## 2021-01-15 MED ORDER — SENNOSIDES-DOCUSATE SODIUM 8.6-50 MG PO TABS
1.0000 | ORAL_TABLET | Freq: Two times a day (BID) | ORAL | Status: DC
  Administered 2021-01-15 – 2021-01-17 (×4): 1 via ORAL
  Filled 2021-01-15 (×4): qty 1

## 2021-01-15 NOTE — Progress Notes (Signed)
PROGRESS NOTE    [name]  IPJ:[id number] DOB: Jun 17, 1946 DOA: 01/06/2021 PCP: [name], MD     Brief Narrative:  [name] is a 75 year old male with medical history of hepatic cirrhosis, portal hypertension, esophageal varices, recurrent anemia, hepatocellular carcinoma status posttreatment in November 2021, CAD s/p CABG, diastolic CHF, EF 73%, CKD stage IIIa, asthma, diabetes mellitus type 2, hypertension, hyperlipidemia presented with shortness of breath and dizziness with complaints of dark stools.  Patient was found to have symptomatic anemia with hemoglobin of 4.8.  He received multiple units of PRBC during this hospitalization.  EGD on 5/13 revealed esophageal varices, acute gastritis, type I gastric varices and portal gastropathy.    Patient underwent TIPS and embolization of gastric varices by IR on 5/18.  New events last 24 hours / Subjective: Patient up walking around in the room.  Has no complaints other than some lower abdominal/pelvic discomfort.  After my initial evaluation, I was notified by RN that patient had a bowel movement.  Assessment & Plan:   Principal Problem:   Symptomatic anemia Active Problems:   Type 2 diabetes with complication (HCC)   Coronary artery disease involving native coronary artery of native heart without angina pectoris   Hyperlipidemia with target LDL less than 70   Hepatic cirrhosis (HCC)   CKD (chronic kidney disease), stage III (HCC)   (HFpEF) heart failure with preserved ejection fraction (HCC)   Recurrent GI bleeding, symptomatic anemia, blood loss anemia, pancytopenia in setting of decompensated cirrhosis -Status post total 5 unit packed red blood cell transfusion so far -Status post EGD on 5/13 which showed esophageal varices, acute gastritis, type I gastric varices and portal gastropathy -GI signed off 5/14 -Status post TIPS, embolization of gastric varices by IR 5/18 -Completed Rocephin for GI ppx  -Discontinue  octreotide  -Hemoglobin remains stable this morning -Demadex, Aldactone -on hold in setting of elevation in creatinine -Lactulose 3 times daily to maintain 3-4 bowel movements daily  Acute on chronic diastolic heart failure -Echocardiogram showed EF 60 to 41%, grade 3 diastolic dysfunction -Demadex, Aldactone -on hold in setting of elevation in creatinine -Plan to resume diuretic tomorrow as long as Cr remains improved -Unna boot ordered for LE edema   Diabetes mellitus type 2 -Continue sliding scale insulin  AKI on CKD stage IIIa -Baseline creatinine around 1.2 -Hold Demadex, Aldactone -Cr improved overnight, plan to resume diuretic tomorrow as long as Cr remains improved  Hyperlipidemia -Continue Lipitor   DVT prophylaxis:  SCDs Start: 01/06/21 1956  Code Status:     Code Status Orders  (From admission, onward)         Start     Ordered   01/06/21 1956  Full code  Continuous        01/06/21 1956        Code Status History    Date Active Date Inactive Code Status Order ID Comments User Context   11/18/2020 1623 11/23/2020 1939 Full Code [id number]  Harold Hedge, MD ED   10/08/2020 2239 10/10/2020 2352 Full Code [id number]  Elwyn Reach, MD ED   09/10/2020 2305 09/12/2020 1939 Full Code [id number]  Vianne Bulls, MD ED   08/11/2020 0351 08/18/2020 1843 Full Code [id number]  Shela Leff, MD Inpatient   08/10/2020 1957 08/11/2020 0351 DNR [id number]  Lavena Bullion, NP Inpatient   08/09/2020 2216 08/10/2020 1957 Full Code [id number]  Neena Rhymes, MD ED   04/15/2020 2216 04/23/2020 1707 Full Code  [id number]  Lenore Cordia, MD ED   12/27/2019 1016 01/04/2020 1733 Full Code [id number]  Vernelle Emerald, MD ED   11/01/2019 0134 11/04/2019 1939 Full Code [id number]  Rhetta Mura, DO ED   07/29/2019 2052 08/07/2019 1608 Full Code [id number]  Lenore Cordia, MD ED   12/24/2018 1128 12/25/2018 1915 Full Code [id number]  Norval Morton, MD ED   12/18/2017 2224  12/22/2017 1431 Full Code [id number]  Vianne Bulls, MD ED   11/20/2017 1951 11/23/2017 1716 Full Code [id number]  Norval Morton, MD ED   09/29/2017 2212 10/05/2017 2026 Full Code [id number]  Norval Morton, MD ED   03/31/2014 1245 04/03/2014 1638 Full Code [id number]  John Giovanni, PA-C Inpatient   03/28/2014 0903 03/31/2014 1245 Full Code [id number]  Belva Crome, MD Inpatient   03/27/2014 1742 03/28/2014 0903 Full Code [id number]  Rogelia Mire, NP Inpatient   02/17/2014 0313 02/18/2014 1339 Full Code [id number]  Elmarie Shiley, MD Inpatient   08/13/2012 2145 08/17/2012 1438 Full Code [id number]  Almyra Free, RN Inpatient   Advance Care Planning Activity    Advance Directive Documentation   Flowsheet Row Most Recent Value  Type of Advance Directive Healthcare Power of Attorney, Living will  Pre-existing out of facility DNR order (yellow form or pink MOST form) --  "MOST" Form in Place? --     Family Communication: None at bedside Disposition Plan:  Status is: Inpatient  Remains inpatient appropriate because:Inpatient level of care appropriate due to severity of illness   Dispo: The patient is from: Home              Anticipated d/c is to: Home              Patient currently is not medically stable to d/c.   Difficult to place patient No   Antimicrobials:  Anti-infectives (From admission, onward)   Start     Dose/Rate Route Frequency Ordered Stop   01/07/21 1100  cefTRIAXone (ROCEPHIN) 2 g in sodium chloride 0.9 % 100 mL IVPB  Status:  Discontinued        2 g 200 mL/hr over 30 Minutes Intravenous Every 24 hours 01/07/21 1010 01/14/21 1256       Objective: Vitals:   01/15/21 0600 01/15/21 0806 01/15/21 0949 01/15/21 1121  BP:   (!) 131/57 (!) 128/56  Pulse:   62 62  Resp:    18  Temp:   97.9 F (36.6 C) 98 F (36.7 C)  TempSrc:   Oral Oral  SpO2:  96% 97% 100%  Weight: 106.1 kg     Height:        Intake/Output Summary (Last 24 hours) at 01/15/2021 1231 Last data  filed at 01/15/2021 7619 Gross per 24 hour  Intake 2020.64 ml  Output 900 ml  Net 1120.64 ml   Filed Weights   01/13/21 0503 01/14/21 0027 01/15/21 0600  Weight: 105.6 kg 104.4 kg 106.1 kg   Examination: General exam: Appears calm and comfortable  Respiratory system: Clear to auscultation. Respiratory effort normal.  On room air Cardiovascular system: S1 & S2 heard, RRR. + Bilateral pitting pedal edema. Gastrointestinal system: Abdomen is mildly distended, soft and nontender. Normal bowel sounds heard. Central nervous system: Alert and oriented. Non focal exam. Speech clear  Extremities: Symmetric in appearance bilaterally  Skin: No rashes, lesions or ulcers on exposed skin  Psychiatry: Judgement and insight appear stable. Mood & affect appropriate.  Data Reviewed: I have personally reviewed following labs and imaging studies  CBC: Recent Labs  Lab 01/12/21 0024 01/13/21 0427 01/13/21 1256 01/13/21 1600 01/13/21 1655 01/14/21 0326 01/15/21 0452  WBC 2.8* 2.7*  --   --  3.9* 4.2 3.5*  NEUTROABS  --  1.8  --   --   --   --   --   HGB 7.6* 7.7* 9.2* 8.5* 7.9* 7.8* 7.4*  HCT 25.9* 26.0* 27.0* 25.0* 26.8* 27.0* 25.9*  MCV 80.7 80.5  --   --  80.2 80.4 80.9  PLT 102* 90*  --   --  91* 87* 70*   Basic Metabolic Panel: Recent Labs  Lab 01/09/21 0508 01/10/21 0433 01/11/21 0442 01/12/21 0024 01/13/21 0427 01/13/21 1256 01/13/21 1600 01/14/21 0326 01/15/21 0452  NA 132* 137 136 137 138 142 140 138 137  K 4.3 4.3 4.0 3.8 4.0 3.7 3.8 3.9 4.0  CL 103 108 104 103 103  --  100 101 103  CO2 21* 25 27 27 29   --   --  30 29  GLUCOSE 137* 116* 101* 134* 117*  --  140* 118* 127*  BUN 22 23 24* 27* 26*  --  24* 24* 23  CREATININE 1.01 1.40* 1.54* 1.40* 1.24  --  1.10 1.50* 1.36*  CALCIUM 8.3* 8.4* 7.9* 8.0* 8.3*  --   --  8.3* 8.6*  MG 2.2 2.3 2.0 2.0 2.1  --   --   --   --    GFR: Estimated Creatinine Clearance: 56.3 mL/min (A) (by C-G formula based on SCr of 1.36 mg/dL  (H)). Liver Function Tests: Recent Labs  Lab 01/12/21 0024 01/13/21 0427 01/14/21 0326 01/15/21 0452  AST 18 17 23 24   ALT 12 11 15 16   ALKPHOS 82 83 88 112  BILITOT 1.1 0.7 0.9 1.0  PROT 5.9* 5.9* 6.4* 5.9*  ALBUMIN 3.2* 3.2* 3.4* 3.3*   No results for input(s): LIPASE, AMYLASE in the last 168 hours. Recent Labs  Lab 01/13/21 0427 01/14/21 0326 01/15/21 0452  AMMONIA 31 24 61*   Coagulation Profile: Recent Labs  Lab 01/14/21 0326  INR 1.3*   Cardiac Enzymes: No results for input(s): CKTOTAL, CKMB, CKMBINDEX, TROPONINI in the last 168 hours. BNP (last 3 results) No results for input(s): PROBNP in the last 8760 hours. HbA1C: No results for input(s): HGBA1C in the last 72 hours. CBG: Recent Labs  Lab 01/14/21 1114 01/14/21 1613 01/14/21 2155 01/15/21 0639 01/15/21 1122  GLUCAP 151* 163* 126* 135* 155*   Lipid Profile: No results for input(s): CHOL, HDL, LDLCALC, TRIG, CHOLHDL, LDLDIRECT in the last 72 hours. Thyroid Function Tests: No results for input(s): TSH, T4TOTAL, FREET4, T3FREE, THYROIDAB in the last 72 hours. Anemia Panel: No results for input(s): VITAMINB12, FOLATE, FERRITIN, TIBC, IRON, RETICCTPCT in the last 72 hours. Sepsis Labs: No results for input(s): PROCALCITON, LATICACIDVEN in the last 168 hours.  Recent Results (from the past 240 hour(s))  Resp Panel by RT-PCR (Flu A&B, Covid) Nasopharyngeal Swab     Status: None   Collection Time: 01/06/21  4:25 PM   Specimen: Nasopharyngeal Swab; Nasopharyngeal(NP) swabs in vial transport medium  Result Value Ref Range Status   SARS Coronavirus 2 by RT PCR NEGATIVE NEGATIVE Final    Comment: (NOTE) SARS-CoV-2 target nucleic acids are NOT DETECTED.  The SARS-CoV-2 RNA is generally detectable in upper respiratory specimens during the acute phase of infection. The lowest concentration of SARS-CoV-2 viral copies this assay can  detect is 138 copies/mL. A negative result does not preclude  SARS-Cov-2 infection and should not be used as the sole basis for treatment or other patient management decisions. A negative result may occur with  improper specimen collection/handling, submission of specimen other than nasopharyngeal swab, presence of viral mutation(s) within the areas targeted by this assay, and inadequate number of viral copies(<138 copies/mL). A negative result must be combined with clinical observations, patient history, and epidemiological information. The expected result is Negative.  Fact Sheet for Patients:  EntrepreneurPulse.com.au  Fact Sheet for Healthcare Providers:  IncredibleEmployment.be  This test is no t yet approved or cleared by the [place] FDA and  has been authorized for detection and/or diagnosis of SARS-CoV-2 by FDA under an Emergency Use Authorization (EUA). This EUA will remain  in effect (meaning this test can be used) for the duration of the COVID-19 declaration under Section 564(b)(1) of the Act, 21 U.S.C.section 360bbb-3(b)(1), unless the authorization is terminated  or revoked sooner.       Influenza A by PCR NEGATIVE NEGATIVE Final   Influenza B by PCR NEGATIVE NEGATIVE Final    Comment: (NOTE) The Xpert Xpress SARS-CoV-2/FLU/RSV plus assay is intended as an aid in the diagnosis of influenza from Nasopharyngeal swab specimens and should not be used as a sole basis for treatment. Nasal washings and aspirates are unacceptable for Xpert Xpress SARS-CoV-2/FLU/RSV testing.  Fact Sheet for Patients: EntrepreneurPulse.com.au  Fact Sheet for Healthcare Providers: IncredibleEmployment.be  This test is not yet approved or cleared by the [place] FDA and has been authorized for detection and/or diagnosis of SARS-CoV-2 by FDA under an Emergency Use Authorization (EUA). This EUA will remain in effect (meaning this test can be used) for the duration of  the COVID-19 declaration under Section 564(b)(1) of the Act, 21 U.S.C. section 360bbb-3(b)(1), unless the authorization is terminated or revoked.  Performed at [facility], [place] [address], [place], [place] [address]       Radiology Studies: IR Tips  Result Date: 01/13/2021 CLINICAL DATA:  75 year old male with history of cirrhosis (Child Pugh A, MELD-Na 11) with associated hemorrhagic gastroesophageal varices, history of HCC status post TARE segmentectomy (November 2021) without evidence of recurrence or new disease. Currently stable without evidence of active hemorrhage. EXAM: 1. Ultrasound-guided access of the right common femoral vein. 2. Intravascular ultrasound. 3. Ultrasound-guided access of the right internal jugular vein. 4. Selective catheterization of the right hepatic vein. 5. Portosystemic manometry. 6. Transjugular intrahepatic portosystemic shunt creation. 7. Selective catheterization of multiple variceal gastric veins. 8. Coil embolization of multiple variceal gastric veins. MEDICATIONS: The patient was receiving ceftriaxone as an inpatient which was administered approximately 1 hour prior to the procedure. ANESTHESIA/SEDATION: General - as administered by the Anesthesia department CONTRAST:  130 mL Omnipaque 300, intravenous FLUOROSCOPY TIME:  Fluoroscopy Time: 31 minutes 48 seconds (1,216 mGy). COMPLICATIONS: None immediate. PROCEDURE: Informed written consent was obtained from the patient after a thorough discussion of the procedural risks, benefits and alternatives. All questions were addressed. Maximal Sterile Barrier Technique was utilized including caps, mask, sterile gowns, sterile gloves, sterile drape, hand hygiene and skin antiseptic. A timeout was performed prior to the initiation of the procedure. The right neck and right groin were prepped and draped in standard fashion. Preprocedure ultrasound evaluation of the right common femoral vein demonstrated a  patent vessel that was compressible and free of internal echoes. A permanent ultrasound image was stored. A small skin nick was made at  the planned needle entry site. Under direct ultrasound visualization, a 21 gauge micropuncture needle was directed into the common femoral vein. After insertion of a micropuncture set, serial dilation was performed over a Rosen wire and an 11 cm, 8 French vascular sheath was placed. Through this 8 French sheath, under direct fluoroscopic visualization, an intracardiac echocardiography intravascular ultrasound probe was advanced to the level of the perihepatic IVC. Intravascular ultrasound demonstrated a patent portal system in close proximity to the right hepatic vein, appropriate anatomy for TIPS creation. Ultrasound evaluation of the right internal jugular vein demonstrated patent vessel that was compressible and free of internal echoes. A permanent ultrasound image was stored. A small skin nick was made at the planned needle entry site. Under direct ultrasound visualization, a 21 gauge micropuncture needle was directed into the right internal jugular vein. A micropuncture set was placed through which a Rosen wire was directed to the inferior vena cava under fluoroscopic guidance. Serial dilation was performed and a 10 French tips sheath was then placed with the tip in the right atrium. Right atrial pressure was then measured (mean 24 mm Hg). Using fluoroscopic guidance, a 5 French angled tip catheter was then directed into the right hepatic vein. Steep right anterior oblique fluoroscopic image confirmed placement in the right hepatic vein. Limited hand injection right hepatic venogram demonstrated wide patency and antegrade flow into the right atrium. The sheath was advanced over the catheter into the right hepatic vein. Using direct intravascular ultrasound guidance, the [place] needle was directed from the right hepatic vein into the right portal vein with a single throw.  Intravascular position was confirmed with aspiration of portal venous blood. Hand injection of contrast demonstrated appropriate puncture site. A glidewire was then directed into the splenic vein. A marking pigtail catheter was inserted and positioned at the portal confluence. Portal venogram demonstrated patent antegrade flow through the patent main, left, and right portal systems. There was a prominent left gastric and several accessory gastric veins with severe gastric varices noted. Portal manometry was performed (mean portal pressure 28 mm mercury, pre TIPS portosystemic gradient equals 4 mm Hg). The intrahepatic tract was then dilated with an 8 x 4 mustang balloon. The 10 French sheath was then advanced to the level of the main portal vein. An 8-10 mm, 8+2 cm Viatorr endograft was then positioned and the portal end was uncovered. The gold ring was positioned at the portal vein entry site. The stent was then fully deployed under fluoroscopic guidance. The pigtail catheter was then reinserted with the pigtail portion formed in the main portal vein. Portal venogram demonstrated patent TIPS stent and decreased but persistent retrograde flow into the gastric varices. Portosystemic manometry was again measured (right atrium mean 24 mmHg, portal vein 29 mmHg, gradient 5 mmHg). Given the elevated right heart pressures, post-deployment dilation was not pursued. The pigtail catheter was exchanged for a 5 Fr angle tip catheter which was used to selectively catheterize the most prominent gastric vein arising from the splenic vein, likely the left gastric. Injection of contrast demonstrated hepatofugal flow with multiple tortuous varicosities near the fundus of the stomach. Three separate gastric veins were selected and embolized with various sizes of POD microcoils via a Lantern microcatheter. The pigtail catheter was reinserted into the portal vein and completion portal venogram was performed which demonstrated widely  patent TIPS stent and minimal retrograde filling of embolized gastric varices. The indwelling pigtail catheter was then removed over a wire and the ICE catheter was removed.  The right IJ and right common femoral vein sheath were then removed and manual compression was applied for 5 minutes with adequate hemostasis. Sterile bandages were applied. The patient was extubated and transferred to the postanesthesia care unit in stable condition. IMPRESSION: 1. Technically successful transjugular intrahepatic portosystemic shunt creation. 2. 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 3. Elevated right heart pressures (mean of 24 mmHg) pre- and post-TIPS creation with minimal portosystemic pressure gradient. Ruthann Cancer, MD [facility] Radiology Electronically Signed   By: Ruthann Cancer MD   On: 01/13/2021 16:54   IR US Guide Vasc Access Right  Result Date: 01/13/2021 CLINICAL DATA:  75 year old male with history of cirrhosis (Child Pugh A, MELD-Na 11) with associated hemorrhagic gastroesophageal varices, history of HCC status post TARE segmentectomy (November 2021) without evidence of recurrence or new disease. Currently stable without evidence of active hemorrhage. EXAM: 1. Ultrasound-guided access of the right common femoral vein. 2. Intravascular ultrasound. 3. Ultrasound-guided access of the right internal jugular vein. 4. Selective catheterization of the right hepatic vein. 5. Portosystemic manometry. 6. Transjugular intrahepatic portosystemic shunt creation. 7. Selective catheterization of multiple variceal gastric veins. 8. Coil embolization of multiple variceal gastric veins. MEDICATIONS: The patient was receiving ceftriaxone as an inpatient which was administered approximately 1 hour prior to the procedure. ANESTHESIA/SEDATION: General - as administered by the Anesthesia department CONTRAST:   130 mL Omnipaque 300, intravenous FLUOROSCOPY TIME:  Fluoroscopy Time: 31 minutes 48 seconds (1,216 mGy). COMPLICATIONS: None immediate. PROCEDURE: Informed written consent was obtained from the patient after a thorough discussion of the procedural risks, benefits and alternatives. All questions were addressed. Maximal Sterile Barrier Technique was utilized including caps, mask, sterile gowns, sterile gloves, sterile drape, hand hygiene and skin antiseptic. A timeout was performed prior to the initiation of the procedure. The right neck and right groin were prepped and draped in standard fashion. Preprocedure ultrasound evaluation of the right common femoral vein demonstrated a patent vessel that was compressible and free of internal echoes. A permanent ultrasound image was stored. A small skin nick was made at the planned needle entry site. Under direct ultrasound visualization, a 21 gauge micropuncture needle was directed into the common femoral vein. After insertion of a micropuncture set, serial dilation was performed over a Rosen wire and an 11 cm, 8 French vascular sheath was placed. Through this 8 French sheath, under direct fluoroscopic visualization, an intracardiac echocardiography intravascular ultrasound probe was advanced to the level of the perihepatic IVC. Intravascular ultrasound demonstrated a patent portal system in close proximity to the right hepatic vein, appropriate anatomy for TIPS creation. Ultrasound evaluation of the right internal jugular vein demonstrated patent vessel that was compressible and free of internal echoes. A permanent ultrasound image was stored. A small skin nick was made at the planned needle entry site. Under direct ultrasound visualization, a 21 gauge micropuncture needle was directed into the right internal jugular vein. A micropuncture set was placed through which a Rosen wire was directed to the inferior vena cava under fluoroscopic guidance. Serial dilation was  performed and a 10 French tips sheath was then placed with the tip in the right atrium. Right atrial pressure was then measured (mean 24 mm Hg). Using fluoroscopic guidance, a 5 French angled tip catheter was then directed into the right hepatic vein. Steep right anterior oblique fluoroscopic image confirmed placement in the right hepatic vein. Limited hand injection right hepatic  venogram demonstrated wide patency and antegrade flow into the right atrium. The sheath was advanced over the catheter into the right hepatic vein. Using direct intravascular ultrasound guidance, the Col[place] needle was directed from the right hepatic vein into the right portal vein with a single throw. Intravascular position was confirmed with aspiration of portal venous blood. Hand injection of contrast demonstrated appropriate puncture site. A glidewire was then directed into the splenic vein. A marking pigtail catheter was inserted and positioned at the portal confluence. Portal venogram demonstrated patent antegrade flow through the patent main, left, and right portal systems. There was a prominent left gastric and several accessory gastric veins with severe gastric varices noted. Portal manometry was performed (mean portal pressure 28 mm mercury, pre TIPS portosystemic gradient equals 4 mm Hg). The intrahepatic tract was then dilated with an 8 x 4 mustang balloon. The 10 French sheath was then advanced to the level of the main portal vein. An 8-10 mm, 8+2 cm Viatorr endograft was then positioned and the portal end was uncovered. The gold ring was positioned at the portal vein entry site. The stent was then fully deployed under fluoroscopic guidance. The pigtail catheter was then reinserted with the pigtail portion formed in the main portal vein. Portal venogram demonstrated patent TIPS stent and decreased but persistent retrograde flow into the gastric varices. Portosystemic manometry was again measured (right atrium mean 24 mmHg,  portal vein 29 mmHg, gradient 5 mmHg). Given the elevated right heart pressures, post-deployment dilation was not pursued. The pigtail catheter was exchanged for a 5 Fr angle tip catheter which was used to selectively catheterize the most prominent gastric vein arising from the splenic vein, likely the left gastric. Injection of contrast demonstrated hepatofugal flow with multiple tortuous varicosities near the fundus of the stomach. Three separate gastric veins were selected and embolized with various sizes of POD microcoils via a Lantern microcatheter. The pigtail catheter was reinserted into the portal vein and completion portal venogram was performed which demonstrated widely patent TIPS stent and minimal retrograde filling of embolized gastric varices. The indwelling pigtail catheter was then removed over a wire and the ICE catheter was removed. The right IJ and right common femoral vein sheath were then removed and manual compression was applied for 5 minutes with adequate hemostasis. Sterile bandages were applied. The patient was extubated and transferred to the postanesthesia care unit in stable condition. IMPRESSION: 1. Technically successful transjugular intrahepatic portosystemic shunt creation. 2. 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 3. Elevated right heart pressures (mean of 24 mmHg) pre- and post-TIPS creation with minimal portosystemic pressure gradient. Ruthann Cancer, MD [facility] Radiology Electronically Signed   By: Ruthann Cancer MD   On: 01/13/2021 16:54   IR IVUS EACH ADDITIONAL NON CORONARY VESSEL  Result Date: 01/13/2021 CLINICAL DATA:  75 year old male with history of cirrhosis (Child Pugh A, MELD-Na 11) with associated hemorrhagic gastroesophageal varices, history of [place] status post TARE segmentectomy (November 2021) without evidence of recurrence or new disease.  Currently stable without evidence of active hemorrhage. EXAM: 1. Ultrasound-guided access of the right common femoral vein. 2. Intravascular ultrasound. 3. Ultrasound-guided access of the right internal jugular vein. 4. Selective catheterization of the right hepatic vein. 5. Portosystemic manometry. 6. Transjugular intrahepatic portosystemic shunt creation. 7. Selective catheterization of multiple variceal gastric veins. 8. Coil embolization of multiple variceal gastric veins. MEDICATIONS: The patient was receiving ceftriaxone as an  inpatient which was administered approximately 1 hour prior to the procedure. ANESTHESIA/SEDATION: General - as administered by the Anesthesia department CONTRAST:  130 mL Omnipaque 300, intravenous FLUOROSCOPY TIME:  Fluoroscopy Time: 31 minutes 48 seconds (1,216 mGy). COMPLICATIONS: None immediate. PROCEDURE: Informed written consent was obtained from the patient after a thorough discussion of the procedural risks, benefits and alternatives. All questions were addressed. Maximal Sterile Barrier Technique was utilized including caps, mask, sterile gowns, sterile gloves, sterile drape, hand hygiene and skin antiseptic. A timeout was performed prior to the initiation of the procedure. The right neck and right groin were prepped and draped in standard fashion. Preprocedure ultrasound evaluation of the right common femoral vein demonstrated a patent vessel that was compressible and free of internal echoes. A permanent ultrasound image was stored. A small skin nick was made at the planned needle entry site. Under direct ultrasound visualization, a 21 gauge micropuncture needle was directed into the common femoral vein. After insertion of a micropuncture set, serial dilation was performed over a Rosen wire and an 11 cm, 8 French vascular sheath was placed. Through this 8 French sheath, under direct fluoroscopic visualization, an intracardiac echocardiography intravascular ultrasound probe was  advanced to the level of the perihepatic IVC. Intravascular ultrasound demonstrated a patent portal system in close proximity to the right hepatic vein, appropriate anatomy for TIPS creation. Ultrasound evaluation of the right internal jugular vein demonstrated patent vessel that was compressible and free of internal echoes. A permanent ultrasound image was stored. A small skin nick was made at the planned needle entry site. Under direct ultrasound visualization, a 21 gauge micropuncture needle was directed into the right internal jugular vein. A micropuncture set was placed through which a Rosen wire was directed to the inferior vena cava under fluoroscopic guidance. Serial dilation was performed and a 10 French tips sheath was then placed with the tip in the right atrium. Right atrial pressure was then measured (mean 24 mm Hg). Using fluoroscopic guidance, a 5 French angled tip catheter was then directed into the right hepatic vein. Steep right anterior oblique fluoroscopic image confirmed placement in the right hepatic vein. Limited hand injection right hepatic venogram demonstrated wide patency and antegrade flow into the right atrium. The sheath was advanced over the catheter into the right hepatic vein. Using direct intravascular ultrasound guidance, the [place] needle was directed from the right hepatic vein into the right portal vein with a single throw. Intravascular position was confirmed with aspiration of portal venous blood. Hand injection of contrast demonstrated appropriate puncture site. A glidewire was then directed into the splenic vein. A marking pigtail catheter was inserted and positioned at the portal confluence. Portal venogram demonstrated patent antegrade flow through the patent main, left, and right portal systems. There was a prominent left gastric and several accessory gastric veins with severe gastric varices noted. Portal manometry was performed (mean portal pressure 28 mm mercury,  pre TIPS portosystemic gradient equals 4 mm Hg). The intrahepatic tract was then dilated with an 8 x 4 mustang balloon. The 10 French sheath was then advanced to the level of the main portal vein. An 8-10 mm, 8+2 cm Viatorr endograft was then positioned and the portal end was uncovered. The gold ring was positioned at the portal vein entry site. The stent was then fully deployed under fluoroscopic guidance. The pigtail catheter was then reinserted with the pigtail portion formed in the main portal vein. Portal venogram demonstrated patent TIPS stent and decreased but persistent retrograde flow  into the gastric varices. Portosystemic manometry was again measured (right atrium mean 24 mmHg, portal vein 29 mmHg, gradient 5 mmHg). Given the elevated right heart pressures, post-deployment dilation was not pursued. The pigtail catheter was exchanged for a 5 Fr angle tip catheter which was used to selectively catheterize the most prominent gastric vein arising from the splenic vein, likely the left gastric. Injection of contrast demonstrated hepatofugal flow with multiple tortuous varicosities near the fundus of the stomach. Three separate gastric veins were selected and embolized with various sizes of POD microcoils via a Lantern microcatheter. The pigtail catheter was reinserted into the portal vein and completion portal venogram was performed which demonstrated widely patent TIPS stent and minimal retrograde filling of embolized gastric varices. The indwelling pigtail catheter was then removed over a wire and the ICE catheter was removed. The right IJ and right common femoral vein sheath were then removed and manual compression was applied for 5 minutes with adequate hemostasis. Sterile bandages were applied. The patient was extubated and transferred to the postanesthesia care unit in stable condition. IMPRESSION: 1. Technically successful transjugular intrahepatic portosystemic shunt creation. 2. Technically successful  coil embolization of three separate variceal gastric veins arising from the splenic vein. 3. Elevated right heart pressures (mean of 24 mmHg) pre- and post-TIPS creation with minimal portosystemic pressure gradient. Ruthann Cancer, MD [facility] Radiology Electronically Signed   By: Ruthann Cancer MD   On: 01/13/2021 16:54   IR EMBO VENOUS NOT HEMORR HEMANG  INC GUIDE ROADMAPPING  Result Date: 01/13/2021 CLINICAL DATA:  75 year old male with history of cirrhosis (Child Pugh A, MELD-Na 11) with associated hemorrhagic gastroesophageal varices, history of [place] status post TARE segmentectomy (November 2021) without evidence of recurrence or new disease. Currently stable without evidence of active hemorrhage. EXAM: 1. Ultrasound-guided access of the right common femoral vein. 2. Intravascular ultrasound. 3. Ultrasound-guided access of the right internal jugular vein. 4. Selective catheterization of the right hepatic vein. 5. Portosystemic manometry. 6. Transjugular intrahepatic portosystemic shunt creation. 7. Selective catheterization of multiple variceal gastric veins. 8. Coil embolization of multiple variceal gastric veins. MEDICATIONS: The patient was receiving ceftriaxone as an inpatient which was administered approximately 1 hour prior to the procedure. ANESTHESIA/SEDATION: General - as administered by the Anesthesia department CONTRAST:  130 mL Omnipaque 300, intravenous FLUOROSCOPY TIME:  Fluoroscopy Time: 31 minutes 48 seconds (1,216 mGy). COMPLICATIONS: None immediate. PROCEDURE: Informed written consent was obtained from the patient after a thorough discussion of the procedural risks, benefits and alternatives. All questions were addressed. Maximal Sterile Barrier Technique was utilized including caps, mask, sterile gowns, sterile gloves, sterile drape, hand hygiene and skin antiseptic. A timeout was performed prior to the initiation of the procedure. The right  neck and right groin were prepped and draped in standard fashion. Preprocedure ultrasound evaluation of the right common femoral vein demonstrated a patent vessel that was compressible and free of internal echoes. A permanent ultrasound image was stored. A small skin nick was made at the planned needle entry site. Under direct ultrasound visualization, a 21 gauge micropuncture needle was directed into the common femoral vein. After insertion of a micropuncture set, serial dilation was performed over a Rosen wire and an 11 cm, 8 French vascular sheath was placed. Through this 8 French sheath, under direct fluoroscopic visualization, an intracardiac echocardiography intravascular ultrasound probe was advanced to the level of the perihepatic IVC. Intravascular ultrasound demonstrated a patent portal system in close proximity to the  right hepatic vein, appropriate anatomy for TIPS creation. Ultrasound evaluation of the right internal jugular vein demonstrated patent vessel that was compressible and free of internal echoes. A permanent ultrasound image was stored. A small skin nick was made at the planned needle entry site. Under direct ultrasound visualization, a 21 gauge micropuncture needle was directed into the right internal jugular vein. A micropuncture set was placed through which a Rosen wire was directed to the inferior vena cava under fluoroscopic guidance. Serial dilation was performed and a 10 French tips sheath was then placed with the tip in the right atrium. Right atrial pressure was then measured (mean 24 mm Hg). Using fluoroscopic guidance, a 5 French angled tip catheter was then directed into the right hepatic vein. Steep right anterior oblique fluoroscopic image confirmed placement in the right hepatic vein. Limited hand injection right hepatic venogram demonstrated wide patency and antegrade flow into the right atrium. The sheath was advanced over the catheter into the right hepatic vein. Using direct  intravascular ultrasound guidance, the [place] needle was directed from the right hepatic vein into the right portal vein with a single throw. Intravascular position was confirmed with aspiration of portal venous blood. Hand injection of contrast demonstrated appropriate puncture site. A glidewire was then directed into the splenic vein. A marking pigtail catheter was inserted and positioned at the portal confluence. Portal venogram demonstrated patent antegrade flow through the patent main, left, and right portal systems. There was a prominent left gastric and several accessory gastric veins with severe gastric varices noted. Portal manometry was performed (mean portal pressure 28 mm mercury, pre TIPS portosystemic gradient equals 4 mm Hg). The intrahepatic tract was then dilated with an 8 x 4 mustang balloon. The 10 French sheath was then advanced to the level of the main portal vein. An 8-10 mm, 8+2 cm Viatorr endograft was then positioned and the portal end was uncovered. The gold ring was positioned at the portal vein entry site. The stent was then fully deployed under fluoroscopic guidance. The pigtail catheter was then reinserted with the pigtail portion formed in the main portal vein. Portal venogram demonstrated patent TIPS stent and decreased but persistent retrograde flow into the gastric varices. Portosystemic manometry was again measured (right atrium mean 24 mmHg, portal vein 29 mmHg, gradient 5 mmHg). Given the elevated right heart pressures, post-deployment dilation was not pursued. The pigtail catheter was exchanged for a 5 Fr angle tip catheter which was used to selectively catheterize the most prominent gastric vein arising from the splenic vein, likely the left gastric. Injection of contrast demonstrated hepatofugal flow with multiple tortuous varicosities near the fundus of the stomach. Three separate gastric veins were selected and embolized with various sizes of POD microcoils via a Lantern  microcatheter. The pigtail catheter was reinserted into the portal vein and completion portal venogram was performed which demonstrated widely patent TIPS stent and minimal retrograde filling of embolized gastric varices. The indwelling pigtail catheter was then removed over a wire and the ICE catheter was removed. The right IJ and right common femoral vein sheath were then removed and manual compression was applied for 5 minutes with adequate hemostasis. Sterile bandages were applied. The patient was extubated and transferred to the postanesthesia care unit in stable condition. IMPRESSION: 1. Technically successful transjugular intrahepatic portosystemic shunt creation. 2. 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 3. Elevated right heart pressures (mean of 24 mmHg) pre- and post-TIPS creation with minimal  portosystemic pressure gradient. Ruthann Cancer, MD [facility] Radiology Electronically Signed   By: Ruthann Cancer MD   On: 01/13/2021 16:54      Scheduled Meds: . atorvastatin  40 mg Oral QHS  . gabapentin  100 mg Oral BID  . insulin aspart  0-6 Units Subcutaneous TID WC  . lactulose  20 g Oral TID  . pantoprazole (PROTONIX) IV  40 mg Intravenous Q12H  . senna-docusate  1 tablet Oral BID  . sodium chloride flush  3 mL Intravenous Q12H  . umeclidinium-vilanterol  1 puff Inhalation Daily   Continuous Infusions:    LOS: 9 days      Time spent: 20 minutes   Dessa Phi, DO [facility] 01/15/2021, 12:31 PM   Available via Epic secure chat 7am-7pm After these hours, please refer to coverage provider listed on [email or web address]

## 2021-01-15 NOTE — Care Management Important Message (Signed)
Important Message  Patient Details  Name: [name] MRN: [id number] Date of Birth: 01/22/1946   Medicare Important Message Given:  Yes     [name] 01/15/2021, 8:10 AM

## 2021-01-15 NOTE — Progress Notes (Signed)
Orthopedic Tech Progress Note Patient Details:  [name] 1946/08/04 [id number]   Ortho Devices Type of Ortho Device: Louretta Par[place] boot Ortho Device/Splint Location: BLE Ortho Device/Splint Interventions: Application,Ordered   Post Interventions Patient Tolerated: Well   [name] Jeri Modena 01/15/2021, 2:22 PM

## 2021-01-15 NOTE — Progress Notes (Signed)
Pt stated that he had total of 3 bowel movements per during day shift.

## 2021-01-16 LAB — AMMONIA: Ammonia: 103 umol/L — ABNORMAL HIGH (ref 9–35)

## 2021-01-16 LAB — GLUCOSE, CAPILLARY
Glucose-Capillary: 124 mg/dL — ABNORMAL HIGH (ref 70–99)
Glucose-Capillary: 213 mg/dL — ABNORMAL HIGH (ref 70–99)
Glucose-Capillary: 197 mg/dL — ABNORMAL HIGH (ref 70–99)
Glucose-Capillary: 136 mg/dL — ABNORMAL HIGH (ref 70–99)

## 2021-01-16 LAB — CBC
HCT: 25.2 % — ABNORMAL LOW (ref 39.0–52.0)
Hemoglobin: 7.3 g/dL — ABNORMAL LOW (ref 13.0–17.0)
MCH: 23.1 pg — ABNORMAL LOW (ref 26.0–34.0)
MCHC: 29 g/dL — ABNORMAL LOW (ref 30.0–36.0)
MCV: 79.7 fL — ABNORMAL LOW (ref 80.0–100.0)
Platelets: 67 10*3/uL — ABNORMAL LOW (ref 150–400)
RBC: 3.16 MIL/uL — ABNORMAL LOW (ref 4.22–5.81)
RDW: 20.9 % — ABNORMAL HIGH (ref 11.5–15.5)
WBC: 2.9 10*3/uL — ABNORMAL LOW (ref 4.0–10.5)
nRBC: 0 % (ref 0.0–0.2)

## 2021-01-16 LAB — COMPREHENSIVE METABOLIC PANEL
ALT: 18 U/L (ref 0–44)
AST: 34 U/L (ref 15–41)
Albumin: 3.2 g/dL — ABNORMAL LOW (ref 3.5–5.0)
Alkaline Phosphatase: 137 U/L — ABNORMAL HIGH (ref 38–126)
Anion gap: 7 (ref 5–15)
BUN: 24 mg/dL — ABNORMAL HIGH (ref 8–23)
CO2: 27 mmol/L (ref 22–32)
Calcium: 8.7 mg/dL — ABNORMAL LOW (ref 8.9–10.3)
Chloride: 103 mmol/L (ref 98–111)
Creatinine, Ser: 1.43 mg/dL — ABNORMAL HIGH (ref 0.61–1.24)
GFR, Estimated: 51 mL/min — ABNORMAL LOW (ref >60)
Glucose, Bld: 113 mg/dL — ABNORMAL HIGH (ref 70–99)
Potassium: 4.3 mmol/L (ref 3.5–5.1)
Sodium: 137 mmol/L (ref 135–145)
Total Bilirubin: 0.8 mg/dL (ref 0.3–1.2)
Total Protein: 6 g/dL — ABNORMAL LOW (ref 6.5–8.1)

## 2021-01-16 MED ORDER — PANTOPRAZOLE SODIUM 40 MG PO TBEC
40.0000 mg | DELAYED_RELEASE_TABLET | Freq: Two times a day (BID) | ORAL | Status: DC
  Administered 2021-01-16 – 2021-01-17 (×3): 40 mg via ORAL
  Filled 2021-01-16 (×3): qty 1

## 2021-01-16 MED ORDER — FUROSEMIDE 10 MG/ML IJ SOLN
40.0000 mg | Freq: Every day | INTRAMUSCULAR | Status: DC
  Administered 2021-01-16 – 2021-01-17 (×2): 40 mg via INTRAVENOUS
  Filled 2021-01-16 (×2): qty 4

## 2021-01-16 NOTE — Progress Notes (Signed)
Patient has had two bowel movements today.

## 2021-01-16 NOTE — Progress Notes (Signed)
PROGRESS NOTE    [name]  OZH:[id number] DOB: 1946/02/25 DOA: 01/06/2021 PCP: [name], MD     Brief Narrative:  [name] is a 75 year old male with medical history of hepatic cirrhosis, portal hypertension, esophageal varices, recurrent anemia, hepatocellular carcinoma status posttreatment in November 2021, CAD s/p CABG, diastolic CHF, EF 62%, CKD stage IIIa, asthma, diabetes mellitus type 2, hypertension, hyperlipidemia presented with shortness of breath and dizziness with complaints of dark stools.  Patient was found to have symptomatic anemia with hemoglobin of 4.8.  He received multiple units of PRBC during this hospitalization.  EGD on 5/13 revealed esophageal varices, acute gastritis, type I gastric varices and portal gastropathy.    Patient underwent TIPS and embolization of gastric varices by IR on 5/18.  New events last 24 hours / Subjective: States that he had 4 bowel movements yesterday.  None today.  Sitting at the side of bed, eating breakfast now.  States that his lower extremity edema is about the same as his usual.  Has no abdominal pain.  No shortness of breath.  Eager to go home soon.  Assessment & Plan:   Principal Problem:   Symptomatic anemia Active Problems:   Type 2 diabetes with complication (HCC)   Coronary artery disease involving native coronary artery of native heart without angina pectoris   Hyperlipidemia with target LDL less than 70   Hepatic cirrhosis (HCC)   CKD (chronic kidney disease), stage III (HCC)   (HFpEF) heart failure with preserved ejection fraction (HCC)   Recurrent GI bleeding, symptomatic anemia, blood loss anemia, pancytopenia in setting of decompensated cirrhosis -Status post total 5 unit packed red blood cell transfusion so far -Status post EGD on 5/13 which showed esophageal varices, acute gastritis, type I gastric varices and portal gastropathy -GI signed off 5/14 -Status post TIPS, embolization of gastric  varices by IR 5/18 -Completed Rocephin for GI ppx  -Discontinue octreotide  -Hemoglobin remains stable this morning -Lactulose 3 times daily to maintain 3-4 bowel movements daily  Acute on chronic diastolic heart failure -Echocardiogram showed EF 60 to 95%, grade 3 diastolic dysfunction -Start Lasix -Unna boot ordered for LE edema   Diabetes mellitus type 2 -Continue sliding scale insulin  AKI on CKD stage IIIa -Baseline creatinine around 1.2 -Continue to watch creatinine, Lasix started today  Hyperlipidemia -Continue Lipitor   DVT prophylaxis:  SCDs Start: 01/06/21 1956  Code Status:     Code Status Orders  (From admission, onward)         Start     Ordered   01/06/21 1956  Full code  Continuous        01/06/21 1956        Code Status History    Date Active Date Inactive Code Status Order ID Comments User Context   11/18/2020 1623 11/23/2020 1939 Full Code [id number]  Harold Hedge, MD ED   10/08/2020 2239 10/10/2020 2352 Full Code [id number]  Elwyn Reach, MD ED   09/10/2020 2305 09/12/2020 1939 Full Code [id number]  Vianne Bulls, MD ED   08/11/2020 0351 08/18/2020 1843 Full Code [id number]  Shela Leff, MD Inpatient   08/10/2020 1957 08/11/2020 0351 DNR [id number]  Lavena Bullion, NP Inpatient   08/09/2020 2216 08/10/2020 1957 Full Code [id number]  Neena Rhymes, MD ED   04/15/2020 2216 04/23/2020 1707 Full Code [id number]  Lenore Cordia, MD ED   12/27/2019 1016 01/04/2020 1733 Full Code [id number]  Shalhoub, Sherryll Burger, MD  ED   11/01/2019 0134 11/04/2019 1939 Full Code [id number]  Rhetta Mura, DO ED   07/29/2019 2052 08/07/2019 1608 Full Code [id number]  Lenore Cordia, MD ED   12/24/2018 1128 12/25/2018 1915 Full Code [id number]  Norval Morton, MD ED   12/18/2017 2224 12/22/2017 1431 Full Code [id number]  Vianne Bulls, MD ED   11/20/2017 1951 11/23/2017 1716 Full Code [id number]  Norval Morton, MD ED   09/29/2017 2212 10/05/2017 2026 Full Code [id number]   Norval Morton, MD ED   03/31/2014 1245 04/03/2014 1638 Full Code [id number]  John Giovanni, PA-C Inpatient   03/28/2014 0903 03/31/2014 1245 Full Code [id number]  Belva Crome, MD Inpatient   03/27/2014 1742 03/28/2014 0903 Full Code [id number]  Rogelia Mire, NP Inpatient   02/17/2014 0313 02/18/2014 1339 Full Code [id number]  Elmarie Shiley, MD Inpatient   08/13/2012 2145 08/17/2012 1438 Full Code [id number]  Almyra Free, RN Inpatient   Advance Care Planning Activity    Advance Directive Documentation   Flowsheet Row Most Recent Value  Type of Advance Directive Healthcare Power of Attorney, Living will  Pre-existing out of facility DNR order (yellow form or pink MOST form) --  "MOST" Form in Place? --     Family Communication: None at bedside Disposition Plan:  Status is: Inpatient  Remains inpatient appropriate because:Inpatient level of care appropriate due to severity of illness   Dispo: The patient is from: Home              Anticipated d/c is to: Home              Patient currently is not medically stable to d/c.  IV Lasix today   Difficult to place patient No   Antimicrobials:  Anti-infectives (From admission, onward)   Start     Dose/Rate Route Frequency Ordered Stop   01/07/21 1100  cefTRIAXone (ROCEPHIN) 2 g in sodium chloride 0.9 % 100 mL IVPB  Status:  Discontinued        2 g 200 mL/hr over 30 Minutes Intravenous Every 24 hours 01/07/21 1010 01/14/21 1256       Objective: Vitals:   01/16/21 0422 01/16/21 0425 01/16/21 0943 01/16/21 1133  BP:  (!) 119/54 114/78 (!) 107/48  Pulse:  64 60 (!) 53  Resp:  20 16 20   Temp:  98.8 F (37.1 C) 97.8 F (36.6 C) 98.4 F (36.9 C)  TempSrc:  Oral Oral Oral  SpO2:  97% 99% 100%  Weight: 107 kg     Height:        Intake/Output Summary (Last 24 hours) at 01/16/2021 1339 Last data filed at 01/16/2021 0425 Gross per 24 hour  Intake 847.45 ml  Output 676 ml  Net 171.45 ml   Filed Weights   01/14/21 0027 01/15/21  0600 01/16/21 0422  Weight: 104.4 kg 106.1 kg 107 kg    Examination: General exam: Appears calm and comfortable  Respiratory system: Clear to auscultation. Respiratory effort normal.  On room air.  No conversational dyspnea. Cardiovascular system: S1 & S2 heard, RRR.  Bilateral pitting pedal edema. Gastrointestinal system: Abdomen is nondistended, soft and nontender. Normal bowel sounds heard. Central nervous system: Alert and oriented. Non focal exam. Speech clear  Extremities: Symmetric in appearance bilaterally  Skin: Bilateral lower extremities with Unna boot in place Psychiatry: Judgement and insight appear stable. Mood & affect appropriate.    Data Reviewed: I have personally reviewed following labs and  imaging studies  CBC: Recent Labs  Lab 01/13/21 0427 01/13/21 1256 01/13/21 1600 01/13/21 1655 01/14/21 0326 01/15/21 0452 01/16/21 0228  WBC 2.7*  --   --  3.9* 4.2 3.5* 2.9*  NEUTROABS 1.8  --   --   --   --   --   --   HGB 7.7*   < > 8.5* 7.9* 7.8* 7.4* 7.3*  HCT 26.0*   < > 25.0* 26.8* 27.0* 25.9* 25.2*  MCV 80.5  --   --  80.2 80.4 80.9 79.7*  PLT 90*  --   --  91* 87* 70* 67*   < > = values in this interval not displayed.   Basic Metabolic Panel: Recent Labs  Lab 01/10/21 0433 01/11/21 0442 01/12/21 0024 01/13/21 0427 01/13/21 1256 01/13/21 1600 01/14/21 0326 01/15/21 0452 01/16/21 0228  NA 137 136 137 138 142 140 138 137 137  K 4.3 4.0 3.8 4.0 3.7 3.8 3.9 4.0 4.3  CL 108 104 103 103  --  100 101 103 103  CO2 25 27 27 29   --   --  30 29 27   GLUCOSE 116* 101* 134* 117*  --  140* 118* 127* 113*  BUN 23 24* 27* 26*  --  24* 24* 23 24*  CREATININE 1.40* 1.54* 1.40* 1.24  --  1.10 1.50* 1.36* 1.43*  CALCIUM 8.4* 7.9* 8.0* 8.3*  --   --  8.3* 8.6* 8.7*  MG 2.3 2.0 2.0 2.1  --   --   --   --   --    GFR: Estimated Creatinine Clearance: 53.7 mL/min (A) (by C-G formula based on SCr of 1.43 mg/dL (H)). Liver Function Tests: Recent Labs  Lab 01/12/21 0024  01/13/21 0427 01/14/21 0326 01/15/21 0452 01/16/21 0228  AST 18 17 23 24  34  ALT 12 11 15 16 18   ALKPHOS 82 83 88 112 137*  BILITOT 1.1 0.7 0.9 1.0 0.8  PROT 5.9* 5.9* 6.4* 5.9* 6.0*  ALBUMIN 3.2* 3.2* 3.4* 3.3* 3.2*   No results for input(s): LIPASE, AMYLASE in the last 168 hours. Recent Labs  Lab 01/13/21 0427 01/14/21 0326 01/15/21 0452 01/16/21 0228  AMMONIA 31 24 61* 103*   Coagulation Profile: Recent Labs  Lab 01/14/21 0326  INR 1.3*   Cardiac Enzymes: No results for input(s): CKTOTAL, CKMB, CKMBINDEX, TROPONINI in the last 168 hours. BNP (last 3 results) No results for input(s): PROBNP in the last 8760 hours. HbA1C: No results for input(s): HGBA1C in the last 72 hours. CBG: Recent Labs  Lab 01/15/21 1122 01/15/21 1549 01/15/21 2114 01/16/21 0604 01/16/21 1104  GLUCAP 155* 155* 181* 124* 213*   Lipid Profile: No results for input(s): CHOL, HDL, LDLCALC, TRIG, CHOLHDL, LDLDIRECT in the last 72 hours. Thyroid Function Tests: No results for input(s): TSH, T4TOTAL, FREET4, T3FREE, THYROIDAB in the last 72 hours. Anemia Panel: No results for input(s): VITAMINB12, FOLATE, FERRITIN, TIBC, IRON, RETICCTPCT in the last 72 hours. Sepsis Labs: No results for input(s): PROCALCITON, LATICACIDVEN in the last 168 hours.  Recent Results (from the past 240 hour(s))  Resp Panel by RT-PCR (Flu A&B, Covid) Nasopharyngeal Swab     Status: None   Collection Time: 01/06/21  4:25 PM   Specimen: Nasopharyngeal Swab; Nasopharyngeal(NP) swabs in vial transport medium  Result Value Ref Range Status   SARS Coronavirus 2 by RT PCR NEGATIVE NEGATIVE Final    Comment: (NOTE) SARS-CoV-2 target nucleic acids are NOT DETECTED.  The SARS-CoV-2 RNA is generally  detectable in upper respiratory specimens during the acute phase of infection. The lowest concentration of SARS-CoV-2 viral copies this assay can detect is 138 copies/mL. A negative result does not preclude  SARS-Cov-2 infection and should not be used as the sole basis for treatment or other patient management decisions. A negative result may occur with  improper specimen collection/handling, submission of specimen other than nasopharyngeal swab, presence of viral mutation(s) within the areas targeted by this assay, and inadequate number of viral copies(<138 copies/mL). A negative result must be combined with clinical observations, patient history, and epidemiological information. The expected result is Negative.  Fact Sheet for Patients:  EntrepreneurPulse.com.au  Fact Sheet for Healthcare Providers:  IncredibleEmployment.be  This test is no t yet approved or cleared by the [place] FDA and  has been authorized for detection and/or diagnosis of SARS-CoV-2 by FDA under an Emergency Use Authorization (EUA). This EUA will remain  in effect (meaning this test can be used) for the duration of the COVID-19 declaration under Section 564(b)(1) of the Act, 21 U.S.C.section 360bbb-3(b)(1), unless the authorization is terminated  or revoked sooner.       Influenza A by PCR NEGATIVE NEGATIVE Final   Influenza B by PCR NEGATIVE NEGATIVE Final    Comment: (NOTE) The Xpert Xpress SARS-CoV-2/FLU/RSV plus assay is intended as an aid in the diagnosis of influenza from Nasopharyngeal swab specimens and should not be used as a sole basis for treatment. Nasal washings and aspirates are unacceptable for Xpert Xpress SARS-CoV-2/FLU/RSV testing.  Fact Sheet for Patients: EntrepreneurPulse.com.au  Fact Sheet for Healthcare Providers: IncredibleEmployment.be  This test is not yet approved or cleared by the [place] FDA and has been authorized for detection and/or diagnosis of SARS-CoV-2 by FDA under an Emergency Use Authorization (EUA). This EUA will remain in effect (meaning this test can be used) for the duration of  the COVID-19 declaration under Section 564(b)(1) of the Act, 21 U.S.C. section 360bbb-3(b)(1), unless the authorization is terminated or revoked.  Performed at [facility], [place] [address] [place], [place] [address]       Radiology Studies: No results found.    Scheduled Meds: . atorvastatin  40 mg Oral QHS  . furosemide  40 mg Intravenous Daily  . gabapentin  100 mg Oral BID  . insulin aspart  0-6 Units Subcutaneous TID WC  . lactulose  20 g Oral TID  . pantoprazole  40 mg Oral BID  . senna-docusate  1 tablet Oral BID  . sodium chloride flush  3 mL Intravenous Q12H  . umeclidinium-vilanterol  1 puff Inhalation Daily   Continuous Infusions:    LOS: 10 days      Time spent: 20 minutes   Dessa Phi, DO [facility] 01/16/2021, 1:39 PM   Available via Epic secure chat 7am-7pm After these hours, please refer to coverage provider listed on [email or web address]

## 2021-01-17 LAB — GLUCOSE, CAPILLARY
Glucose-Capillary: 155 mg/dL — ABNORMAL HIGH (ref 70–99)
Glucose-Capillary: 177 mg/dL — ABNORMAL HIGH (ref 70–99)

## 2021-01-17 LAB — CBC
HCT: 25.2 % — ABNORMAL LOW (ref 39.0–52.0)
Hemoglobin: 7.3 g/dL — ABNORMAL LOW (ref 13.0–17.0)
MCH: 23.3 pg — ABNORMAL LOW (ref 26.0–34.0)
MCHC: 29 g/dL — ABNORMAL LOW (ref 30.0–36.0)
MCV: 80.5 fL (ref 80.0–100.0)
Platelets: 73 10*3/uL — ABNORMAL LOW (ref 150–400)
RBC: 3.13 MIL/uL — ABNORMAL LOW (ref 4.22–5.81)
RDW: 20.9 % — ABNORMAL HIGH (ref 11.5–15.5)
WBC: 3 10*3/uL — ABNORMAL LOW (ref 4.0–10.5)
nRBC: 0 % (ref 0.0–0.2)

## 2021-01-17 LAB — BASIC METABOLIC PANEL
Anion gap: 8 (ref 5–15)
BUN: 21 mg/dL (ref 8–23)
CO2: 26 mmol/L (ref 22–32)
Calcium: 8.6 mg/dL — ABNORMAL LOW (ref 8.9–10.3)
Chloride: 103 mmol/L (ref 98–111)
Creatinine, Ser: 1.22 mg/dL (ref 0.61–1.24)
GFR, Estimated: >60 mL/min (ref >60)
Glucose, Bld: 121 mg/dL — ABNORMAL HIGH (ref 70–99)
Potassium: 3.9 mmol/L (ref 3.5–5.1)
Sodium: 137 mmol/L (ref 135–145)

## 2021-01-17 MED ORDER — LACTULOSE 10 GM/15ML PO SOLN: 20.0000 g | Freq: Three times a day (TID) | ORAL | 0 refills | Status: DC

## 2021-01-17 NOTE — TOC Transition Note (Signed)
Transition of Care [facility]) - CM/SW Discharge Note   Patient Details  Name: [name] MRN: [id number] Date of Birth: 04/15/1946  [facility] of [facility]) CM/SW Contact:  Pollie Friar, RN Phone Number: 01/17/2021, 10:25 AM   Clinical Narrative:    Patient discharging home with self care. No needs per TOC.   Final next level of care: Home/Self Care Barriers to Discharge: No Barriers Identified   Patient Goals and CMS Choice        Discharge Placement                       Discharge Plan and Services                                     Social Determinants of Health (SDOH) Interventions     Readmission Risk Interventions Readmission Risk Prevention Plan 08/12/2020 08/12/2020 12/30/2019  Transportation Screening - Complete Complete  PCP or Specialist Appt within 5-7 Days - - -  Not Complete comments - - -  PCP or Specialist Appt within 3-5 Days Complete - -  Home Care Screening - - -  Medication Review (RN CM) - - -  [place] or [place] - Complete -  Social Work Consult for [place] Planning/Counseling - Complete -  Palliative Care Screening - Not Applicable Not Applicable  Medication Review (RN Care Manager) - - Complete  Some recent data might be hidden

## 2021-01-17 NOTE — Discharge Summary (Signed)
Physician Discharge Summary  [name] ZOX:[id number] DOB: 1946/05/16 DOA: 01/06/2021  PCP: John Koch, MD  Admit date: 01/06/2021 Discharge date: 01/17/2021  Admitted From: Home Disposition:  Home  Recommendations for Outpatient Follow-up:  1. Follow up with PCP in 1 week 2. Follow up with [facility]  3. Follow up with Dr. Serafina [name], IR, in 1 month  4. Monitor CBC and BMP closely for Hgb and Cr   Discharge Condition: Stable CODE STATUS: Full  Diet recommendation:  Diet Orders (From admission, onward)    Start     Ordered   01/17/21 0000  Diet - low sodium heart healthy        01/17/21 0935   01/15/21 1234  Diet 2 gram sodium Room service appropriate? Yes; Fluid consistency: Thin; Fluid restriction: 2000 mL Fluid  Diet effective now       Question Answer Comment  Room service appropriate? Yes   Fluid consistency: Thin   Fluid restriction: 2000 mL Fluid      01/15/21 1233         Brief/Interim Summary: [name] is a 75 year old male with medical history of hepatic cirrhosis, portal hypertension, esophageal varices, recurrent anemia, hepatocellular carcinoma status posttreatment in November 2021, CAD s/p CABG, diastolic CHF, EF 81%, CKD stage IIIa, asthma, diabetes mellitus type 2, hypertension, hyperlipidemia presented with shortness of breath and dizziness with complaints of dark stools. Patient was found to have symptomatic anemia with hemoglobin of 4.8. He received multiple units of PRBC during this hospitalization. EGD on 5/13 revealed esophageal varices, acute gastritis, type I gastric varices and portal gastropathy.   Patient underwent TIPS and embolization of gastric varices by IR on 5/18.  Subjective on day of discharge: Patient doing well, independently ambulating around the room.  Has been having 3-4 bowel movements daily since starting lactulose.  He states that his lower extremity edema has improved from admission.  He denies any significant blood loss  in his stools.  Eager to go home today.  All questions and concerns addressed.  Discharge Diagnoses:  Principal Problem:   Symptomatic anemia Active Problems:   Type 2 diabetes with complication (HCC)   Coronary artery disease involving native coronary artery of native heart without angina pectoris   Hyperlipidemia with target LDL less than 70   Hepatic cirrhosis (HCC)   CKD (chronic kidney disease), stage III (HCC)   (HFpEF) heart failure with preserved ejection fraction (HCC)   Recurrent GI bleeding, symptomatic anemia, blood loss anemia, pancytopenia in setting of decompensated cirrhosis -Status post total 5 unit packed red blood cell transfusion so far -Status post EGD on 5/13 which showed esophageal varices, acute gastritis, type I gastric varices and portal gastropathy -GI signed off 5/14 -Status post TIPS, embolization of gastric varices by IR 5/18 -Completed Rocephin for GI ppx  -Discontinue octreotide  -Hemoglobin remains stable this morning -Lactulose 3 times daily to maintain 3-4 bowel movements daily  Acute on chronic diastolic heart failure -Echocardiogram showed EF 60 to 19%, grade 3 diastolic dysfunction -Resume torsemide, spironolactone  Diabetes mellitus type 2 -Continue sliding scale insulin  AKI on CKD stage IIIa -Baseline creatinine around 1.2 -Improved overnight, back to his baseline  Hyperlipidemia -Continue Lipitor   Discharge Instructions  Discharge Instructions    (HEART FAILURE PATIENTS) Call MD:  Anytime you have any of the following symptoms: 1) 3 pound weight gain in 24 hours or 5 pounds in 1 week 2) shortness of breath, with or without a dry  hacking cough 3) swelling in the hands, feet or stomach 4) if you have to sleep on extra pillows at night in order to breathe.   Complete by: As directed    Call MD for:  difficulty breathing, headache or visual disturbances   Complete by: As directed    Call MD for:  extreme fatigue   Complete by:  As directed    Call MD for:  persistant dizziness or light-headedness   Complete by: As directed    Call MD for:  persistant nausea and vomiting   Complete by: As directed    Call MD for:  severe uncontrolled pain   Complete by: As directed    Call MD for:  temperature >100.4   Complete by: As directed    Diet - low sodium heart healthy   Complete by: As directed    Discharge instructions   Complete by: As directed    You were cared for by a hospitalist during your hospital stay. If you have any questions about your discharge medications or the care you received while you were in the hospital after you are discharged, you can call the unit and ask to speak with the hospitalist on call if the hospitalist that took care of you is not available. Once you are discharged, your primary care physician will handle any further medical issues. Please note that NO REFILLS for any discharge medications will be authorized once you are discharged, as it is imperative that you return to your primary care physician (or establish a relationship with a primary care physician if you do not have one) for your aftercare needs so that they can reassess your need for medications and monitor your lab values.   Increase activity slowly   Complete by: As directed    No wound care   Complete by: As directed      Allergies as of 01/17/2021      Reactions   Fluzone Quadrivalent [influenza Vac Split Quad] Other (See Comments)   The patient stated, in 10/2020: "I am not taking any more flu shots. It liked to have killed me."      Medication List    STOP taking these medications   potassium chloride SA 20 MEQ tablet Commonly known as: KLOR-CON     TAKE these medications   Accu-Chek Softclix Lancets lancets USE AS DIRECTED TO TEST BLOOD SUGAR FOUR TIMES DAILY   albuterol 108 (90 Base) MCG/ACT inhaler Commonly known as: VENTOLIN HFA Inhale 2 puffs into the lungs every 6 (six) hours as needed for wheezing or  shortness of breath.   Anoro Ellipta 62.5-25 MCG/INH Aepb Generic drug: umeclidinium-vilanterol Inhale 1 puff into the lungs daily.   atorvastatin 40 MG tablet Commonly known as: LIPITOR TAKE 1 TABLET BY MOUTH DAILY AT 6 PM What changed: when to take this   blood glucose meter kit and supplies Dispense based on patient and insurance preference. Use up to four times daily as directed. (FOR ICD-10 E10.9, E11.9).   gabapentin 100 MG capsule Commonly known as: NEURONTIN TAKE 1 CAPSULE(100 MG) BY MOUTH TWICE DAILY What changed: See the new instructions.   glimepiride 2 MG tablet Commonly known as: AMARYL TAKE 1 TABLET(2 MG) BY MOUTH DAILY BEFORE BREAKFAST What changed: See the new instructions.   hydrocortisone 2.5 % rectal cream Commonly known as: Anusol-HC Place 1 application rectally 2 (two) times daily. What changed:   when to take this  reasons to take this   lactulose 10  GM/15ML solution Commonly known as: CHRONULAC Take 30 mLs (20 g total) by mouth 3 (three) times daily.   METAMUCIL PO Take 15 mLs by mouth 3 (three) times daily with meals. Mix with 4 oz of juice or water   metFORMIN 850 MG tablet Commonly known as: GLUCOPHAGE TAKE 1 TABLET(850 MG) BY MOUTH TWICE DAILY WITH A MEAL What changed: See the new instructions.   nitroGLYCERIN 0.4 MG SL tablet Commonly known as: NITROSTAT Place 1 tablet (0.4 mg total) under the tongue every 5 (five) minutes as needed for chest pain.   pantoprazole 40 MG tablet Commonly known as: PROTONIX Take 1 tablet (40 mg total) by mouth 2 (two) times daily.   polyethylene glycol powder 17 GM/SCOOP powder Commonly known as: GLYCOLAX/MIRALAX Take 8.5 g by mouth in the morning, at noon, and at bedtime.   spironolactone 25 MG tablet Commonly known as: ALDACTONE Take 1 tablet (25 mg total) by mouth daily.   sucralfate 1 g tablet Commonly known as: CARAFATE Take 1 tablet (1 g total) by mouth 4 (four) times daily. What changed:    how much to take  when to take this   torsemide 20 MG tablet Commonly known as: DEMADEX Take 2 tablets (40 mg total) by mouth 2 (two) times daily.       Follow-up Information    John Koch, MD. Schedule an appointment as soon as possible for a visit in 1 week(s).   Specialty: Internal Medicine Contact information: [place] [address] [phone]        Gastroenterology, [facility] .   Contact information: [address] [place] [address] [phone]              Allergies  Allergen Reactions  . Fluzone Quadrivalent [Influenza Vac Split Quad] Other (See Comments)    The patient stated, in 10/2020: "I am not taking any more flu shots. It liked to have killed me."       Procedures/Studies: IR Tips  Result Date: 01/13/2021 CLINICAL DATA:  75 year old male with history of cirrhosis (Child Pugh A, MELD-Na 11) with associated hemorrhagic gastroesophageal varices, history of HCC status post TARE segmentectomy (November 2021) without evidence of recurrence or new disease. Currently stable without evidence of active hemorrhage. EXAM: 1. Ultrasound-guided access of the right common femoral vein. 2. Intravascular ultrasound. 3. Ultrasound-guided access of the right internal jugular vein. 4. Selective catheterization of the right hepatic vein. 5. Portosystemic manometry. 6. Transjugular intrahepatic portosystemic shunt creation. 7. Selective catheterization of multiple variceal gastric veins. 8. Coil embolization of multiple variceal gastric veins. MEDICATIONS: The patient was receiving ceftriaxone as an inpatient which was administered approximately 1 hour prior to the procedure. ANESTHESIA/SEDATION: General - as administered by the Anesthesia department CONTRAST:  130 mL Omnipaque 300, intravenous FLUOROSCOPY TIME:  Fluoroscopy Time: 31 minutes 48 seconds (1,216 mGy). COMPLICATIONS: None immediate. PROCEDURE: Informed written consent was  obtained from the patient after a thorough discussion of the procedural risks, benefits and alternatives. All questions were addressed. Maximal Sterile Barrier Technique was utilized including caps, mask, sterile gowns, sterile gloves, sterile drape, hand hygiene and skin antiseptic. A timeout was performed prior to the initiation of the procedure. The right neck and right groin were prepped and draped in standard fashion. Preprocedure ultrasound evaluation of the right common femoral vein demonstrated a patent vessel that was compressible and free of internal echoes. A permanent ultrasound image was stored. A small skin nick was  made at the planned needle entry site. Under direct ultrasound visualization, a 21 gauge micropuncture needle was directed into the common femoral vein. After insertion of a micropuncture set, serial dilation was performed over a Rosen wire and an 11 cm, 8 French vascular sheath was placed. Through this 8 French sheath, under direct fluoroscopic visualization, an intracardiac echocardiography intravascular ultrasound probe was advanced to the level of the perihepatic IVC. Intravascular ultrasound demonstrated a patent portal system in close proximity to the right hepatic vein, appropriate anatomy for TIPS creation. Ultrasound evaluation of the right internal jugular vein demonstrated patent vessel that was compressible and free of internal echoes. A permanent ultrasound image was stored. A small skin nick was made at the planned needle entry site. Under direct ultrasound visualization, a 21 gauge micropuncture needle was directed into the right internal jugular vein. A micropuncture set was placed through which a Rosen wire was directed to the inferior vena cava under fluoroscopic guidance. Serial dilation was performed and a 10 French tips sheath was then placed with the tip in the right atrium. Right atrial pressure was then measured (mean 24 mm Hg). Using fluoroscopic guidance, a 5  French angled tip catheter was then directed into the right hepatic vein. Steep right anterior oblique fluoroscopic image confirmed placement in the right hepatic vein. Limited hand injection right hepatic venogram demonstrated wide patency and antegrade flow into the right atrium. The sheath was advanced over the catheter into the right hepatic vein. Using direct intravascular ultrasound guidance, the [place] needle was directed from the right hepatic vein into the right portal vein with a single throw. Intravascular position was confirmed with aspiration of portal venous blood. Hand injection of contrast demonstrated appropriate puncture site. A glidewire was then directed into the splenic vein. A marking pigtail catheter was inserted and positioned at the portal confluence. Portal venogram demonstrated patent antegrade flow through the patent main, left, and right portal systems. There was a prominent left gastric and several accessory gastric veins with severe gastric varices noted. Portal manometry was performed (mean portal pressure 28 mm mercury, pre TIPS portosystemic gradient equals 4 mm Hg). The intrahepatic tract was then dilated with an 8 x 4 mustang balloon. The 10 French sheath was then advanced to the level of the main portal vein. An 8-10 mm, 8+2 cm Viatorr endograft was then positioned and the portal end was uncovered. The gold ring was positioned at the portal vein entry site. The stent was then fully deployed under fluoroscopic guidance. The pigtail catheter was then reinserted with the pigtail portion formed in the main portal vein. Portal venogram demonstrated patent TIPS stent and decreased but persistent retrograde flow into the gastric varices. Portosystemic manometry was again measured (right atrium mean 24 mmHg, portal vein 29 mmHg, gradient 5 mmHg). Given the elevated right heart pressures, post-deployment dilation was not pursued. The pigtail catheter was exchanged for a 5 Fr angle tip  catheter which was used to selectively catheterize the most prominent gastric vein arising from the splenic vein, likely the left gastric. Injection of contrast demonstrated hepatofugal flow with multiple tortuous varicosities near the fundus of the stomach. Three separate gastric veins were selected and embolized with various sizes of POD microcoils via a Lantern microcatheter. The pigtail catheter was reinserted into the portal vein and completion portal venogram was performed which demonstrated widely patent TIPS stent and minimal retrograde filling of embolized gastric varices. The indwelling pigtail catheter was then removed over a wire and the ICE catheter  was removed. The right IJ and right common femoral vein sheath were then removed and manual compression was applied for 5 minutes with adequate hemostasis. Sterile bandages were applied. The patient was extubated and transferred to the postanesthesia care unit in stable condition. IMPRESSION: 1. Technically successful transjugular intrahepatic portosystemic shunt creation. 2. 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 3. Elevated right heart pressures (mean of 24 mmHg) pre- and post-TIPS creation with minimal portosystemic pressure gradient. Ruthann Cancer, MD [facility] Electronically Signed   By: Ruthann Cancer MD   On: 01/13/2021 16:54   IR US Guide Vasc Access Right  Result Date: 01/13/2021 CLINICAL DATA:  75 year old male with history of cirrhosis (Child Pugh A, MELD-Na 11) with associated hemorrhagic gastroesophageal varices, history of HCC status post TARE segmentectomy (November 2021) without evidence of recurrence or new disease. Currently stable without evidence of active hemorrhage. EXAM: 1. Ultrasound-guided access of the right common femoral vein. 2. Intravascular ultrasound. 3. Ultrasound-guided access of the right  internal jugular vein. 4. Selective catheterization of the right hepatic vein. 5. Portosystemic manometry. 6. Transjugular intrahepatic portosystemic shunt creation. 7. Selective catheterization of multiple variceal gastric veins. 8. Coil embolization of multiple variceal gastric veins. MEDICATIONS: The patient was receiving ceftriaxone as an inpatient which was administered approximately 1 hour prior to the procedure. ANESTHESIA/SEDATION: General - as administered by the Anesthesia department CONTRAST:  130 mL Omnipaque 300, intravenous FLUOROSCOPY TIME:  Fluoroscopy Time: 31 minutes 48 seconds (1,216 mGy). COMPLICATIONS: None immediate. PROCEDURE: Informed written consent was obtained from the patient after a thorough discussion of the procedural risks, benefits and alternatives. All questions were addressed. Maximal Sterile Barrier Technique was utilized including caps, mask, sterile gowns, sterile gloves, sterile drape, hand hygiene and skin antiseptic. A timeout was performed prior to the initiation of the procedure. The right neck and right groin were prepped and draped in standard fashion. Preprocedure ultrasound evaluation of the right common femoral vein demonstrated a patent vessel that was compressible and free of internal echoes. A permanent ultrasound image was stored. A small skin nick was made at the planned needle entry site. Under direct ultrasound visualization, a 21 gauge micropuncture needle was directed into the common femoral vein. After insertion of a micropuncture set, serial dilation was performed over a Rosen wire and an 11 cm, 8 French vascular sheath was placed. Through this 8 French sheath, under direct fluoroscopic visualization, an intracardiac echocardiography intravascular ultrasound probe was advanced to the level of the perihepatic IVC. Intravascular ultrasound demonstrated a patent portal system in close proximity to the right hepatic vein, appropriate anatomy for TIPS creation.  Ultrasound evaluation of the right internal jugular vein demonstrated patent vessel that was compressible and free of internal echoes. A permanent ultrasound image was stored. A small skin nick was made at the planned needle entry site. Under direct ultrasound visualization, a 21 gauge micropuncture needle was directed into the right internal jugular vein. A micropuncture set was placed through which a Rosen wire was directed to the inferior vena cava under fluoroscopic guidance. Serial dilation was performed and a 10 French tips sheath was then placed with the tip in the right atrium. Right atrial pressure was then measured (mean 24 mm Hg). Using fluoroscopic guidance, a 5 French angled tip catheter was then directed into the right hepatic vein. Steep right anterior oblique fluoroscopic image confirmed placement in the right hepatic vein. Limited hand injection  right hepatic venogram demonstrated wide patency and antegrade flow into the right atrium. The sheath was advanced over the catheter into the right hepatic vein. Using direct intravascular ultrasound guidance, the Col[place] needle was directed from the right hepatic vein into the right portal vein with a single throw. Intravascular position was confirmed with aspiration of portal venous blood. Hand injection of contrast demonstrated appropriate puncture site. A glidewire was then directed into the splenic vein. A marking pigtail catheter was inserted and positioned at the portal confluence. Portal venogram demonstrated patent antegrade flow through the patent main, left, and right portal systems. There was a prominent left gastric and several accessory gastric veins with severe gastric varices noted. Portal manometry was performed (mean portal pressure 28 mm mercury, pre TIPS portosystemic gradient equals 4 mm Hg). The intrahepatic tract was then dilated with an 8 x 4 mustang balloon. The 10 French sheath was then advanced to the level of the main portal  vein. An 8-10 mm, 8+2 cm Viatorr endograft was then positioned and the portal end was uncovered. The gold ring was positioned at the portal vein entry site. The stent was then fully deployed under fluoroscopic guidance. The pigtail catheter was then reinserted with the pigtail portion formed in the main portal vein. Portal venogram demonstrated patent TIPS stent and decreased but persistent retrograde flow into the gastric varices. Portosystemic manometry was again measured (right atrium mean 24 mmHg, portal vein 29 mmHg, gradient 5 mmHg). Given the elevated right heart pressures, post-deployment dilation was not pursued. The pigtail catheter was exchanged for a 5 Fr angle tip catheter which was used to selectively catheterize the most prominent gastric vein arising from the splenic vein, likely the left gastric. Injection of contrast demonstrated hepatofugal flow with multiple tortuous varicosities near the fundus of the stomach. Three separate gastric veins were selected and embolized with various sizes of POD microcoils via a Lantern microcatheter. The pigtail catheter was reinserted into the portal vein and completion portal venogram was performed which demonstrated widely patent TIPS stent and minimal retrograde filling of embolized gastric varices. The indwelling pigtail catheter was then removed over a wire and the ICE catheter was removed. The right IJ and right common femoral vein sheath were then removed and manual compression was applied for 5 minutes with adequate hemostasis. Sterile bandages were applied. The patient was extubated and transferred to the postanesthesia care unit in stable condition. IMPRESSION: 1. Technically successful transjugular intrahepatic portosystemic shunt creation. 2. 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 3. Elevated right heart pressures (mean of 24 mmHg) pre- and post-TIPS creation with minimal  portosystemic pressure gradient. Ruthann Cancer, MD [facility] Electronically Signed   By: Ruthann Cancer MD   On: 01/13/2021 16:54   DG Chest Port 1 View  Result Date: 01/06/2021 CLINICAL DATA:  Dyspnea.  Shortness of breath. EXAM: PORTABLE CHEST 1 VIEW COMPARISON:  11/20/2020 FINDINGS: Post median sternotomy and CABG. The heart is enlarged. Stable mediastinal contours. Interstitial edema which appears similar to prior exam. Probable right pleural effusion. No confluent consolidation or pneumothorax. Stable osseous structures. IMPRESSION: Cardiomegaly with interstitial edema, similar to prior exam. Probable right pleural effusion. Electronically Signed   By: Keith Rake M.D.   On: 01/06/2021 16:50   ECHOCARDIOGRAM COMPLETE  Result Date: 01/12/2021    ECHOCARDIOGRAM REPORT   Patient Name:   DEWITT JUDICE [facility] Date of Exam: 01/12/2021 Medical Rec #:  [id number]  Height:       68.0 in Accession #:    [id number]    Weight:       238.8 lb Date of Birth:  Jan 10, 1946     BSA:          2.204 m Patient Age:    75 years      BP:           99/43 mmHg Patient Gender: M             HR:           53 bpm. Exam Location:  Inpatient Procedure: 2D Echo, Cardiac Doppler and Color Doppler Indications:    CHF and right heart function prior to TIPS procedure  History:        Patient has prior history of Echocardiogram examinations, most                 recent 05/27/2020. CHF, CAD, Defibrillator; Risk                 Factors:Hypertension, Diabetes and Dyslipidemia.  Sonographer:    Luisa Hart RDCS Referring Phys: [place]  1. Left ventricular ejection fraction, by estimation, is 60 to 65%. The left ventricle has normal function. The left ventricle has no regional wall motion abnormalities. There is moderate concentric left ventricular hypertrophy. Left ventricular diastolic parameters are consistent with Grade III diastolic dysfunction (restrictive).   2. Right ventricular systolic function is low normal. The right ventricular size is normal. There is mildly elevated pulmonary artery systolic pressure.  3. The mitral valve is normal in structure. No evidence of mitral valve regurgitation. The mean mitral valve gradient is 4.0 mmHg with average heart rate of 56 bpm.  4. The aortic valve is tricuspid. There is moderate calcification of the aortic valve. Aortic valve regurgitation is mild.  5. The inferior vena cava is dilated in size with <50% respiratory variability, suggesting right atrial pressure of 15 mmHg. Comparison(s): A prior study was performed on 05/27/20. No significant change from prior study. Prior images reviewed side by side. RV is similar. FINDINGS  Left Ventricle: Left ventricular ejection fraction, by estimation, is 60 to 65%. The left ventricle has normal function. The left ventricle has no regional wall motion abnormalities. The left ventricular internal cavity size was normal in size. There is  moderate concentric left ventricular hypertrophy. Left ventricular diastolic parameters are consistent with Grade III diastolic dysfunction (restrictive). Right Ventricle: The right ventricular size is normal. No increase in right ventricular wall thickness. Right ventricular systolic function is low normal. There is mildly elevated pulmonary artery systolic pressure. The tricuspid regurgitant velocity is 2.67 m/s, and with an assumed right atrial pressure of 8 mmHg, the estimated right ventricular systolic pressure is 87.5 mmHg. Left Atrium: Left atrial size was normal in size. Right Atrium: Right atrial size was normal in size. Pericardium: There is no evidence of pericardial effusion. Mitral Valve: The mitral valve is normal in structure. No evidence of mitral valve regurgitation. MV peak gradient, 11.7 mmHg. The mean mitral valve gradient is 4.0 mmHg with average heart rate of 56 bpm. Tricuspid Valve: The tricuspid valve is grossly normal. Tricuspid  valve regurgitation is not demonstrated. No evidence of tricuspid stenosis. Aortic Valve: The aortic valve is tricuspid. There is moderate calcification of the aortic valve. Aortic valve regurgitation is mild. Aortic regurgitation PHT measures 368 msec. Aortic valve mean gradient measures 8.0 mmHg. Aortic valve peak gradient measures 16.6 mmHg. Aortic  valve area, by VTI measures 1.42 cm. Pulmonic Valve: The pulmonic valve was grossly normal. Pulmonic valve regurgitation is trivial. No evidence of pulmonic stenosis. Aorta: The aortic root and ascending aorta are structurally normal, with no evidence of dilitation. Venous: The inferior vena cava is dilated in size with less than 50% respiratory variability, suggesting right atrial pressure of 15 mmHg. IAS/Shunts: The atrial septum is grossly normal.  LEFT VENTRICLE PLAX 2D LVIDd:         4.80 cm      Diastology LVIDs:         2.90 cm      LV e' medial:    7.18 cm/s LV PW:         1.40 cm      LV E/e' medial:  21.0 LV IVS:        1.60 cm      LV e' lateral:   8.70 cm/s LVOT diam:     2.00 cm      LV E/e' lateral: 17.4 LV SV:         73 LV SV Index:   33 LVOT Area:     3.14 cm  LV Volumes (MOD) LV vol d, MOD A2C: 171.0 ml LV vol d, MOD A4C: 127.0 ml LV vol s, MOD A2C: 46.2 ml LV vol s, MOD A4C: 32.6 ml LV SV MOD A2C:     124.8 ml LV SV MOD A4C:     127.0 ml LV SV MOD BP:      106.9 ml RIGHT VENTRICLE RV S prime:     10.20 cm/s  PULMONARY VEINS TAPSE (M-mode): 1.3 cm      A Reversal Duration: 102.00 msec                             A Reversal Velocity: 26.60 cm/s                             Diastolic Velocity:  35.45 cm/s                             S/D Velocity:        0.50                             Systolic Velocity:   62.56 cm/s LEFT ATRIUM             Index       RIGHT ATRIUM           Index LA diam:        4.60 cm 2.09 cm/m  RA Area:     15.60 cm LA Vol (A2C):   84.1 ml 38.16 ml/m RA Volume:   35.00 ml  15.88 ml/m LA Vol (A4C):   49.4 ml 22.42 ml/m LA  Biplane Vol: 64.4 ml 29.22 ml/m  AORTIC VALVE                    PULMONIC VALVE AV Area (Vmax):    1.59 cm     PV Vmax:       1.09 m/s AV Area (Vmean):   1.60 cm     PV Vmean:      72.700 cm/s AV Area (VTI):     1.42 cm  PV VTI:        0.267 m AV Vmax:           204.00 cm/s  PV Peak grad:  4.8 mmHg AV Vmean:          129.500 cm/s PV Mean grad:  2.0 mmHg AV VTI:            0.510 m AV Peak Grad:      16.6 mmHg AV Mean Grad:      8.0 mmHg LVOT Vmax:         103.00 cm/s LVOT Vmean:        66.100 cm/s LVOT VTI:          0.231 m LVOT/AV VTI ratio: 0.45 AI PHT:            368 msec  AORTA Ao Root diam: 3.60 cm Ao Asc diam:  3.15 cm MITRAL VALVE                TRICUSPID VALVE MV Area (PHT): 2.91 cm     TR Peak grad:   28.5 mmHg MV Area VTI:   1.41 cm     TR Vmax:        267.00 cm/s MV Peak grad:  11.7 mmHg MV Mean grad:  4.0 mmHg     SHUNTS MV Vmax:       1.71 m/s     Systemic VTI:  0.23 m MV Vmean:      93.8 cm/s    Systemic Diam: 2.00 cm MV Decel Time: 261 msec MV E velocity: 151.00 cm/s MV A velocity: 39.80 cm/s MV E/A ratio:  3.79 Rudean Haskell MD Electronically signed by Rudean Haskell MD Signature Date/Time: 01/12/2021/11:34:54 AM    Final    IR IVUS EACH ADDITIONAL NON CORONARY VESSEL  Result Date: 01/13/2021 CLINICAL DATA:  75 year old male with history of cirrhosis (Child Pugh A, MELD-Na 11) with associated hemorrhagic gastroesophageal varices, history of HCC status post TARE segmentectomy (November 2021) without evidence of recurrence or new disease. Currently stable without evidence of active hemorrhage. EXAM: 1. Ultrasound-guided access of the right common femoral vein. 2. Intravascular ultrasound. 3. Ultrasound-guided access of the right internal jugular vein. 4. Selective catheterization of the right hepatic vein. 5. Portosystemic manometry. 6. Transjugular intrahepatic portosystemic shunt creation. 7. Selective catheterization of multiple variceal gastric veins. 8. Coil embolization of  multiple variceal gastric veins. MEDICATIONS: The patient was receiving ceftriaxone as an inpatient which was administered approximately 1 hour prior to the procedure. ANESTHESIA/SEDATION: General - as administered by the Anesthesia department CONTRAST:  130 mL Omnipaque 300, intravenous FLUOROSCOPY TIME:  Fluoroscopy Time: 31 minutes 48 seconds (1,216 mGy). COMPLICATIONS: None immediate. PROCEDURE: Informed written consent was obtained from the patient after a thorough discussion of the procedural risks, benefits and alternatives. All questions were addressed. Maximal Sterile Barrier Technique was utilized including caps, mask, sterile gowns, sterile gloves, sterile drape, hand hygiene and skin antiseptic. A timeout was performed prior to the initiation of the procedure. The right neck and right groin were prepped and draped in standard fashion. Preprocedure ultrasound evaluation of the right common femoral vein demonstrated a patent vessel that was compressible and free of internal echoes. A permanent ultrasound image was stored. A small skin nick was made at the planned needle entry site. Under direct ultrasound visualization, a 21 gauge micropuncture needle was directed into the common femoral vein. After insertion of a micropuncture set, serial dilation was performed over a Rosen wire  and an 11 cm, 8 French vascular sheath was placed. Through this 8 French sheath, under direct fluoroscopic visualization, an intracardiac echocardiography intravascular ultrasound probe was advanced to the level of the perihepatic IVC. Intravascular ultrasound demonstrated a patent portal system in close proximity to the right hepatic vein, appropriate anatomy for TIPS creation. Ultrasound evaluation of the right internal jugular vein demonstrated patent vessel that was compressible and free of internal echoes. A permanent ultrasound image was stored. A small skin nick was made at the planned needle entry site. Under direct  ultrasound visualization, a 21 gauge micropuncture needle was directed into the right internal jugular vein. A micropuncture set was placed through which a Rosen wire was directed to the inferior vena cava under fluoroscopic guidance. Serial dilation was performed and a 10 French tips sheath was then placed with the tip in the right atrium. Right atrial pressure was then measured (mean 24 mm Hg). Using fluoroscopic guidance, a 5 French angled tip catheter was then directed into the right hepatic vein. Steep right anterior oblique fluoroscopic image confirmed placement in the right hepatic vein. Limited hand injection right hepatic venogram demonstrated wide patency and antegrade flow into the right atrium. The sheath was advanced over the catheter into the right hepatic vein. Using direct intravascular ultrasound guidance, the Col[place]into needle was directed from the right hepatic vein into the right portal vein with a single throw. Intravascular position was confirmed with aspiration of portal venous blood. Hand injection of contrast demonstrated appropriate puncture site. A glidewire was then directed into the splenic vein. A marking pigtail catheter was inserted and positioned at the portal confluence. Portal venogram demonstrated patent antegrade flow through the patent main, left, and right portal systems. There was a prominent left gastric and several accessory gastric veins with severe gastric varices noted. Portal manometry was performed (mean portal pressure 28 mm mercury, pre TIPS portosystemic gradient equals 4 mm Hg). The intrahepatic tract was then dilated with an 8 x 4 mustang balloon. The 10 French sheath was then advanced to the level of the main portal vein. An 8-10 mm, 8+2 cm Viatorr endograft was then positioned and the portal end was uncovered. The gold ring was positioned at the portal vein entry site. The stent was then fully deployed under fluoroscopic guidance. The pigtail catheter was then  reinserted with the pigtail portion formed in the main portal vein. Portal venogram demonstrated patent TIPS stent and decreased but persistent retrograde flow into the gastric varices. Portosystemic manometry was again measured (right atrium mean 24 mmHg, portal vein 29 mmHg, gradient 5 mmHg). Given the elevated right heart pressures, post-deployment dilation was not pursued. The pigtail catheter was exchanged for a 5 Fr angle tip catheter which was used to selectively catheterize the most prominent gastric vein arising from the splenic vein, likely the left gastric. Injection of contrast demonstrated hepatofugal flow with multiple tortuous varicosities near the fundus of the stomach. Three separate gastric veins were selected and embolized with various sizes of POD microcoils via a Lantern microcatheter. The pigtail catheter was reinserted into the portal vein and completion portal venogram was performed which demonstrated widely patent TIPS stent and minimal retrograde filling of embolized gastric varices. The indwelling pigtail catheter was then removed over a wire and the ICE catheter was removed. The right IJ and right common femoral vein sheath were then removed and manual compression was applied for 5 minutes with adequate hemostasis. Sterile bandages were applied. The patient was extubated and transferred to the  postanesthesia care unit in stable condition. IMPRESSION: 1. Technically successful transjugular intrahepatic portosystemic shunt creation. 2. 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 3. Elevated right heart pressures (mean of 24 mmHg) pre- and post-TIPS creation with minimal portosystemic pressure gradient. Ruthann Cancer, MD [facility] Electronically Signed   By: Ruthann Cancer MD   On: 01/13/2021 16:54   IR EMBO VENOUS NOT HEMORR HEMANG  INC GUIDE  ROADMAPPING  Result Date: 01/13/2021 CLINICAL DATA:  75 year old male with history of cirrhosis (Child Pugh A, MELD-Na 11) with associated hemorrhagic gastroesophageal varices, history of HCC status post TARE segmentectomy (November 2021) without evidence of recurrence or new disease. Currently stable without evidence of active hemorrhage. EXAM: 1. Ultrasound-guided access of the right common femoral vein. 2. Intravascular ultrasound. 3. Ultrasound-guided access of the right internal jugular vein. 4. Selective catheterization of the right hepatic vein. 5. Portosystemic manometry. 6. Transjugular intrahepatic portosystemic shunt creation. 7. Selective catheterization of multiple variceal gastric veins. 8. Coil embolization of multiple variceal gastric veins. MEDICATIONS: The patient was receiving ceftriaxone as an inpatient which was administered approximately 1 hour prior to the procedure. ANESTHESIA/SEDATION: General - as administered by the Anesthesia department CONTRAST:  130 mL Omnipaque 300, intravenous FLUOROSCOPY TIME:  Fluoroscopy Time: 31 minutes 48 seconds (1,216 mGy). COMPLICATIONS: None immediate. PROCEDURE: Informed written consent was obtained from the patient after a thorough discussion of the procedural risks, benefits and alternatives. All questions were addressed. Maximal Sterile Barrier Technique was utilized including caps, mask, sterile gowns, sterile gloves, sterile drape, hand hygiene and skin antiseptic. A timeout was performed prior to the initiation of the procedure. The right neck and right groin were prepped and draped in standard fashion. Preprocedure ultrasound evaluation of the right common femoral vein demonstrated a patent vessel that was compressible and free of internal echoes. A permanent ultrasound image was stored. A small skin nick was made at the planned needle entry site. Under direct ultrasound visualization, a 21 gauge micropuncture needle was directed into the common  femoral vein. After insertion of a micropuncture set, serial dilation was performed over a Rosen wire and an 11 cm, 8 French vascular sheath was placed. Through this 8 French sheath, under direct fluoroscopic visualization, an intracardiac echocardiography intravascular ultrasound probe was advanced to the level of the perihepatic IVC. Intravascular ultrasound demonstrated a patent portal system in close proximity to the right hepatic vein, appropriate anatomy for TIPS creation. Ultrasound evaluation of the right internal jugular vein demonstrated patent vessel that was compressible and free of internal echoes. A permanent ultrasound image was stored. A small skin nick was made at the planned needle entry site. Under direct ultrasound visualization, a 21 gauge micropuncture needle was directed into the right internal jugular vein. A micropuncture set was placed through which a Rosen wire was directed to the inferior vena cava under fluoroscopic guidance. Serial dilation was performed and a 10 French tips sheath was then placed with the tip in the right atrium. Right atrial pressure was then measured (mean 24 mm Hg). Using fluoroscopic guidance, a 5 French angled tip catheter was then directed into the right hepatic vein. Steep right anterior oblique fluoroscopic image confirmed placement in the right hepatic vein. Limited hand injection right hepatic venogram demonstrated wide patency and antegrade flow into the right atrium. The sheath was advanced over the catheter into the right hepatic vein. Using direct intravascular ultrasound guidance, the Colopinto needle  was directed from the right hepatic vein into the right portal vein with a single throw. Intravascular position was confirmed with aspiration of portal venous blood. Hand injection of contrast demonstrated appropriate puncture site. A glidewire was then directed into the splenic vein. A marking pigtail catheter was inserted and positioned at the portal  confluence. Portal venogram demonstrated patent antegrade flow through the patent main, left, and right portal systems. There was a prominent left gastric and several accessory gastric veins with severe gastric varices noted. Portal manometry was performed (mean portal pressure 28 mm mercury, pre TIPS portosystemic gradient equals 4 mm Hg). The intrahepatic tract was then dilated with an 8 x 4 mustang balloon. The 10 French sheath was then advanced to the level of the main portal vein. An 8-10 mm, 8+2 cm Viatorr endograft was then positioned and the portal end was uncovered. The gold ring was positioned at the portal vein entry site. The stent was then fully deployed under fluoroscopic guidance. The pigtail catheter was then reinserted with the pigtail portion formed in the main portal vein. Portal venogram demonstrated patent TIPS stent and decreased but persistent retrograde flow into the gastric varices. Portosystemic manometry was again measured (right atrium mean 24 mmHg, portal vein 29 mmHg, gradient 5 mmHg). Given the elevated right heart pressures, post-deployment dilation was not pursued. The pigtail catheter was exchanged for a 5 Fr angle tip catheter which was used to selectively catheterize the most prominent gastric vein arising from the splenic vein, likely the left gastric. Injection of contrast demonstrated hepatofugal flow with multiple tortuous varicosities near the fundus of the stomach. Three separate gastric veins were selected and embolized with various sizes of POD microcoils via a Lantern microcatheter. The pigtail catheter was reinserted into the portal vein and completion portal venogram was performed which demonstrated widely patent TIPS stent and minimal retrograde filling of embolized gastric varices. The indwelling pigtail catheter was then removed over a wire and the ICE catheter was removed. The right IJ and right common femoral vein sheath were then removed and manual compression was  applied for 5 minutes with adequate hemostasis. Sterile bandages were applied. The patient was extubated and transferred to the postanesthesia care unit in stable condition. IMPRESSION: 1. Technically successful transjugular intrahepatic portosystemic shunt creation. 2. 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 3. Elevated right heart pressures (mean of 24 mmHg) pre- and post-TIPS creation with minimal portosystemic pressure gradient. Ruthann Cancer, MD [facility] Electronically Signed   By: Ruthann Cancer MD   On: 01/13/2021 16:54   CT Angio Abd/Pel w/ and/or w/o  Result Date: 01/08/2021 CLINICAL DATA:  75 year old male with history of cirrhosis, hepatocellular carcinoma status post radiation segmentectomy segment 8 in November 2021, and gastroesophageal varices presenting with anemia and possible gastrointestinal hemorrhage. EXAM: CTA ABDOMEN AND PELVIS WITH CONTRAST TECHNIQUE: Multidetector CT imaging of the abdomen and pelvis was performed using the standard protocol before, during, and after bolus administration of intravenous contrast. Multiplanar reconstructed images and MIPs were obtained and reviewed to evaluate the vascular anatomy. CONTRAST:  120m OMNIPAQUE IOHEXOL 350 MG/ML SOLN COMPARISON:  04/20/2020 FINDINGS: VASCULAR Aorta: Normal caliber aorta without aneurysm, dissection, vasculitis or significant stenosis. Circumferential atherosclerotic calcifications. Celiac: Patent without evidence of aneurysm, dissection, vasculitis or significant stenosis. SMA: Patent without evidence of aneurysm, dissection, vasculitis or significant stenosis. Renals: Severe stenosis of the single right renal ostia secondary prominent atherosclerotic plaque. The dual  left renal arteries are patent. IMA: Patent without evidence of aneurysm, dissection, vasculitis or significant stenosis. Inflow:  Patent without evidence of aneurysm, dissection, vasculitis or significant stenosis. Proximal Outflow: Bilateral common femoral and visualized portions of the superficial and profunda femoral arteries are patent without evidence of aneurysm, dissection, vasculitis or significant stenosis. Veins: The hepatic veins appear patent. The portal system appears widely patent. Gastroesophageal varices arise from the splenic vein. No evidence of portosystemic shunt. The renal veins are widely patent. No evidence of iliocaval anomaly or thrombus. Review of the MIP images confirms the above findings. NON-VASCULAR Lower chest: Small right and trace left pleural effusions with associated passive bibasilar atelectasis. Mild diffuse ground-glass pulmonary opacities and interlobular septal thickening. Mild global cardiomegaly. No pericardial effusion. Severe coronary atherosclerotic calcifications. Partially visualized calcified mediastinal and hilar lymph nodes. Hepatobiliary: Shrunken and nodular contour of the liver compatible with history of cirrhosis. Ill-defined hypodensity in segment 8 measuring approximately 3.5 cm without appreciable enhancement on arterial or delayed venous phases. No new hepatomas. The gallbladder is present unremarkable. No intra or extrahepatic biliary ductal dilation. Pancreas: Unremarkable. No pancreatic ductal dilatation or surrounding inflammatory changes. Spleen: Upper limits of normal size measuring up to 13 mm in craniocaudal dimension. Scattered punctate calcifications. No mass. Adrenals/Urinary Tract: Adrenal glands are unremarkable. Unchanged diffuse atrophy of the right kidney with similar appearing upper pole nonobstructive renal calculus measuring up to 14 mm. The left kidney is normal. No hydronephrosis. The bladder is distended without wall thickening. Bladder is unremarkable. Stomach/Bowel: Multiple prominent and patent distal esophageal varices are visualized. Multiple prominent varices  about the gastric cardia. Stomach is otherwise within normal limits. Appendix is surgically absent. No evidence of bowel wall thickening, distention, or inflammatory changes. No evidence of intraluminal hemorrhage. Lymphatic: Prominent bilateral inguinal lymph nodes with preserved fatty hila, likely reactive. No abdominopelvic lymphadenopathy. Reproductive: Prostate is unremarkable. Other: Trace ascites.  Diffuse body wall anasarca. Musculoskeletal: No acute or significant osseous findings. IMPRESSION: VASCULAR 1. No evidence of gastrointestinal hemorrhage, as queried. 2. Prominent esophageal and gastric varices. No evidence of renal portosystemic shunt. 3. Similar appearing severe focal stenosis about the right renal artery ostium with associated chronic right renal atrophy. NON-VASCULAR 1. Similar appearing morphologic changes of cirrhosis with secondary signs of portal hypertension including trace ascites, gastroesophageal varices, small right trace left pleural effusions, mild pulmonary edema, and body wall anasarca. With similar appearing cardiomegaly, there is likely partial cardiogenic etiology to fluid overload status. 2. Postprocedural changes after segment 8 hepatic radiation segmentectomy without evidence of persistent enhancing mass. 3. Evidence of prior granulomatous disease. 4. Nonobstructive right upper pole nephrolithiasis, similar to comparison. Ruthann Cancer, MD [facility] Electronically Signed   By: Ruthann Cancer MD   On: 01/08/2021 16:19       Discharge Exam: Vitals:   01/17/21 0741 01/17/21 0805  BP:  (!) 122/56  Pulse:  67  Resp:  20  Temp:  98.4 F (36.9 C)  SpO2: 99% 100%    General: Pt is alert, awake, not in acute distress Cardiovascular: RRR, S1/S2 +, +bilateral LE edema Respiratory: CTA bilaterally, no wheezing, no rhonchi, no respiratory distress, no conversational dyspnea, on room air  Abdominal: Soft, NT, ND,  bowel sounds + Extremities: +edema, no cyanosis Psych: Normal mood and affect, stable judgement and insight     The results of significant diagnostics from this hospitalization (including imaging, microbiology, ancillary and laboratory) are listed below for reference.  Microbiology: No results found for this or any previous visit (from the past 240 hour(s)).   Labs: BNP (last 3 results) Recent Labs    10/08/20 1727 10/10/20 0333 01/06/21 1630  BNP 311.1* 432.1* 702.6*   Basic Metabolic Panel: Recent Labs  Lab 01/11/21 0442 01/12/21 0024 01/13/21 0427 01/13/21 1256 01/13/21 1600 01/14/21 0326 01/15/21 0452 01/16/21 0228 01/17/21 0118  NA 136 137 138   < > 140 138 137 137 137  K 4.0 3.8 4.0   < > 3.8 3.9 4.0 4.3 3.9  CL 104 103 103  --  100 101 103 103 103  CO2 '27 27 29  ' --   --  '30 29 27 26  ' GLUCOSE 101* 134* 117*  --  140* 118* 127* 113* 121*  BUN 24* 27* 26*  --  24* 24* 23 24* 21  CREATININE 1.54* 1.40* 1.24  --  1.10 1.50* 1.36* 1.43* 1.22  CALCIUM 7.9* 8.0* 8.3*  --   --  8.3* 8.6* 8.7* 8.6*  MG 2.0 2.0 2.1  --   --   --   --   --   --    < > = values in this interval not displayed.   Liver Function Tests: Recent Labs  Lab 01/12/21 0024 01/13/21 0427 01/14/21 0326 01/15/21 0452 01/16/21 0228  AST '18 17 23 24 ' 34  ALT '12 11 15 16 18  ' ALKPHOS 82 83 88 112 137*  BILITOT 1.1 0.7 0.9 1.0 0.8  PROT 5.9* 5.9* 6.4* 5.9* 6.0*  ALBUMIN 3.2* 3.2* 3.4* 3.3* 3.2*   No results for input(s): LIPASE, AMYLASE in the last 168 hours. Recent Labs  Lab 01/13/21 0427 01/14/21 0326 01/15/21 0452 01/16/21 0228  AMMONIA 31 24 61* 103*   CBC: Recent Labs  Lab 01/13/21 0427 01/13/21 1256 01/13/21 1655 01/14/21 0326 01/15/21 0452 01/16/21 0228 01/17/21 0118  WBC 2.7*  --  3.9* 4.2 3.5* 2.9* 3.0*  NEUTROABS 1.8  --   --   --   --   --   --   HGB 7.7*   < > 7.9* 7.8* 7.4* 7.3* 7.3*  HCT 26.0*   < > 26.8* 27.0* 25.9* 25.2* 25.2*  MCV 80.5  --  80.2 80.4 80.9  79.7* 80.5  PLT 90*  --  91* 87* 70* 67* 73*   < > = values in this interval not displayed.   Cardiac Enzymes: No results for input(s): CKTOTAL, CKMB, CKMBINDEX, TROPONINI in the last 168 hours. BNP: Invalid input(s): POCBNP CBG: Recent Labs  Lab 01/16/21 0604 01/16/21 1104 01/16/21 1537 01/16/21 2059 01/17/21 0603  GLUCAP 124* 213* 197* 136* 155*   D-Dimer No results for input(s): DDIMER in the last 72 hours. Hgb A1c No results for input(s): HGBA1C in the last 72 hours. Lipid Profile No results for input(s): CHOL, HDL, LDLCALC, TRIG, CHOLHDL, LDLDIRECT in the last 72 hours. Thyroid function studies No results for input(s): TSH, T4TOTAL, T3FREE, THYROIDAB in the last 72 hours.  Invalid input(s): FREET3 Anemia work up No results for input(s): VITAMINB12, FOLATE, FERRITIN, TIBC, IRON, RETICCTPCT in the last 72 hours. Urinalysis    Component Value Date/Time   COLORURINE STRAW (A) 11/18/2020 [place] 11/18/2020 1136   LABSPEC 1.008 11/18/2020 1136   PHURINE 6.0 11/18/2020 1136   GLUCOSEU NEGATIVE 11/18/2020 1136   HGBUR NEGATIVE 11/18/2020 1136   BILIRUBINUR NEGATIVE 11/18/2020 [place] 11/18/2020 1136   PROTEINUR NEGATIVE 11/18/2020 1136   UROBILINOGEN  0.2 03/29/2014 1546   NITRITE NEGATIVE 11/18/2020 1136   LEUKOCYTESUR NEGATIVE 11/18/2020 1136   Sepsis Labs Invalid input(s): PROCALCITONIN,  WBC,  LACTICIDVEN Microbiology No results found for this or any previous visit (from the past 240 hour(s)).   Patient was seen and examined on the day of discharge and was found to be in stable condition. Time coordinating discharge: 25 minutes including assessment and coordination of care, as well as examination of the patient.   SIGNED:  Dessa Phi, DO [facility] 01/17/2021, 9:36 AM

## 2021-01-18 ENCOUNTER — Telehealth: Payer: Self-pay

## 2021-01-18 NOTE — Telephone Encounter (Signed)
Transition Care Management Follow-up Telephone Call  Date of discharge and from where: 01/17/2021 from [facility]  How have you been since you were released from the hospital? Doing pretty good per son.  Any questions or concerns? No  Items Reviewed:  Did the pt receive and understand the discharge instructions provided? Yes   Medications obtained and verified? Yes   Other? No   Any new allergies since your discharge? No   Dietary orders reviewed? Yes, low sodium heart healthy  Do you have support at home? Yes   Home Care and Equipment/Supplies: Were home health services ordered? no If so, what is the name of the agency? n/a  Has the agency set up a time to come to the patient's home? no Were any new equipment or medical supplies ordered?  No What is the name of the medical supply agency? n/a Were you able to get the supplies/equipment? no Do you have any questions related to the use of the equipment or supplies? No  Functional Questionnaire: (I = Independent and D = Dependent) ADLs: I  Bathing/Dressing- I  Meal Prep- I  Eating- I  Maintaining continence- I  Transferring/Ambulation- I, using a walker  Managing Meds- I  Follow up appointments reviewed:   PCP Hospital f/u appt confirmed? Yes  Scheduled to see Pricilla Holm, MD on 01/26/2021 @ 2:40 pm.  [facility] f/u appt confirmed? No    Are transportation arrangements needed? No   If their condition worsens, is the pt aware to call PCP or go to the Emergency Dept.? Yes  Was the patient provided with contact information for the PCP's office or ED? Yes  Was to pt encouraged to call back with questions or concerns? Yes

## 2021-01-19 ENCOUNTER — Telehealth: Payer: Self-pay | Admitting: *Deleted

## 2021-01-19 NOTE — Chronic Care Management (AMB) (Signed)
  Care Management   Note  01/19/2021 Name: [name] MRN: [id number] DOB: 06/06/1946  [name] is a 75 y.o. year old male who is a primary care patient of [name], MD and is actively engaged with the care management team. I reached out to [name] by phone today to assist with scheduling a follow up visit with the RN Case Manager.  Follow up plan: Unsuccessful telephone outreach attempt made. A HIPAA compliant phone message was left for the patient providing contact information and requesting a return call.  The care management team will reach out to the patient again over the next 7 days.  If patient returns call to provider office, please advise to call [place] at [phone].  [place] [facility]

## 2021-01-20 NOTE — Telephone Encounter (Signed)
Error

## 2021-01-22 ENCOUNTER — Emergency Department (HOSPITAL_COMMUNITY): Payer: Medicare HMO

## 2021-01-22 ENCOUNTER — Other Ambulatory Visit: Payer: Self-pay | Admitting: Interventional Radiology

## 2021-01-22 ENCOUNTER — Inpatient Hospital Stay (HOSPITAL_COMMUNITY)
Admission: EM | Admit: 2021-01-22 | Discharge: 2021-01-30 | DRG: 432 | Disposition: A | Discharge status: Home Health | Payer: Medicare HMO | Attending: Internal Medicine | Admitting: Internal Medicine

## 2021-01-22 DIAGNOSIS — G4733 Obstructive sleep apnea (adult) (pediatric): Secondary | ICD-10-CM | POA: Diagnosis present

## 2021-01-22 DIAGNOSIS — R4182 Altered mental status, unspecified: Secondary | ICD-10-CM | POA: Diagnosis not present

## 2021-01-22 DIAGNOSIS — I517 Cardiomegaly: Secondary | ICD-10-CM | POA: Diagnosis not present

## 2021-01-22 DIAGNOSIS — K922 Gastrointestinal hemorrhage, unspecified: Secondary | ICD-10-CM | POA: Diagnosis not present

## 2021-01-22 DIAGNOSIS — K7290 Hepatic failure, unspecified without coma: Principal | ICD-10-CM | POA: Diagnosis present

## 2021-01-22 DIAGNOSIS — Z951 Presence of aortocoronary bypass graft: Secondary | ICD-10-CM

## 2021-01-22 DIAGNOSIS — Z713 Dietary counseling and surveillance: Secondary | ICD-10-CM

## 2021-01-22 DIAGNOSIS — R410 Disorientation, unspecified: Secondary | ICD-10-CM | POA: Diagnosis not present

## 2021-01-22 DIAGNOSIS — Z87820 Personal history of traumatic brain injury: Secondary | ICD-10-CM

## 2021-01-22 DIAGNOSIS — D509 Iron deficiency anemia, unspecified: Secondary | ICD-10-CM | POA: Diagnosis present

## 2021-01-22 DIAGNOSIS — R531 Weakness: Secondary | ICD-10-CM | POA: Diagnosis not present

## 2021-01-22 DIAGNOSIS — D649 Anemia, unspecified: Secondary | ICD-10-CM

## 2021-01-22 DIAGNOSIS — I5033 Acute on chronic diastolic (congestive) heart failure: Secondary | ICD-10-CM | POA: Diagnosis present

## 2021-01-22 DIAGNOSIS — E118 Type 2 diabetes mellitus with unspecified complications: Secondary | ICD-10-CM | POA: Diagnosis present

## 2021-01-22 DIAGNOSIS — Z20822 Contact with and (suspected) exposure to covid-19: Secondary | ICD-10-CM | POA: Diagnosis present

## 2021-01-22 DIAGNOSIS — K3189 Other diseases of stomach and duodenum: Secondary | ICD-10-CM | POA: Diagnosis present

## 2021-01-22 DIAGNOSIS — R112 Nausea with vomiting, unspecified: Secondary | ICD-10-CM | POA: Diagnosis not present

## 2021-01-22 DIAGNOSIS — I10 Essential (primary) hypertension: Secondary | ICD-10-CM | POA: Diagnosis not present

## 2021-01-22 DIAGNOSIS — I272 Pulmonary hypertension, unspecified: Secondary | ICD-10-CM | POA: Diagnosis present

## 2021-01-22 DIAGNOSIS — Z79899 Other long term (current) drug therapy: Secondary | ICD-10-CM

## 2021-01-22 DIAGNOSIS — R17 Unspecified jaundice: Secondary | ICD-10-CM | POA: Diagnosis not present

## 2021-01-22 DIAGNOSIS — N183 Chronic kidney disease, stage 3 unspecified: Secondary | ICD-10-CM | POA: Diagnosis present

## 2021-01-22 DIAGNOSIS — R21 Rash and other nonspecific skin eruption: Secondary | ICD-10-CM | POA: Diagnosis not present

## 2021-01-22 DIAGNOSIS — K219 Gastro-esophageal reflux disease without esophagitis: Secondary | ICD-10-CM | POA: Diagnosis present

## 2021-01-22 DIAGNOSIS — I864 Gastric varices: Secondary | ICD-10-CM | POA: Diagnosis present

## 2021-01-22 DIAGNOSIS — E872 Acidosis: Secondary | ICD-10-CM | POA: Diagnosis present

## 2021-01-22 DIAGNOSIS — I2510 Atherosclerotic heart disease of native coronary artery without angina pectoris: Secondary | ICD-10-CM | POA: Diagnosis present

## 2021-01-22 DIAGNOSIS — D500 Iron deficiency anemia secondary to blood loss (chronic): Secondary | ICD-10-CM | POA: Diagnosis present

## 2021-01-22 DIAGNOSIS — E1122 Type 2 diabetes mellitus with diabetic chronic kidney disease: Secondary | ICD-10-CM | POA: Diagnosis present

## 2021-01-22 DIAGNOSIS — R404 Transient alteration of awareness: Secondary | ICD-10-CM | POA: Diagnosis not present

## 2021-01-22 DIAGNOSIS — Z8249 Family history of ischemic heart disease and other diseases of the circulatory system: Secondary | ICD-10-CM

## 2021-01-22 DIAGNOSIS — Z887 Allergy status to serum and vaccine status: Secondary | ICD-10-CM

## 2021-01-22 DIAGNOSIS — K7682 Hepatic encephalopathy: Secondary | ICD-10-CM | POA: Diagnosis present

## 2021-01-22 DIAGNOSIS — E669 Obesity, unspecified: Secondary | ICD-10-CM | POA: Diagnosis present

## 2021-01-22 DIAGNOSIS — I130 Hypertensive heart and chronic kidney disease with heart failure and stage 1 through stage 4 chronic kidney disease, or unspecified chronic kidney disease: Secondary | ICD-10-CM | POA: Diagnosis present

## 2021-01-22 DIAGNOSIS — Z9114 Patient's other noncompliance with medication regimen: Secondary | ICD-10-CM

## 2021-01-22 DIAGNOSIS — N1831 Chronic kidney disease, stage 3a: Secondary | ICD-10-CM | POA: Diagnosis present

## 2021-01-22 DIAGNOSIS — J3489 Other specified disorders of nose and nasal sinuses: Secondary | ICD-10-CM | POA: Diagnosis not present

## 2021-01-22 DIAGNOSIS — J329 Chronic sinusitis, unspecified: Secondary | ICD-10-CM | POA: Diagnosis not present

## 2021-01-22 DIAGNOSIS — K7040 Alcoholic hepatic failure without coma: Principal | ICD-10-CM | POA: Diagnosis present

## 2021-01-22 DIAGNOSIS — G8929 Other chronic pain: Secondary | ICD-10-CM | POA: Diagnosis present

## 2021-01-22 DIAGNOSIS — Z6834 Body mass index (BMI) 34.0-34.9, adult: Secondary | ICD-10-CM

## 2021-01-22 DIAGNOSIS — K7031 Alcoholic cirrhosis of liver with ascites: Secondary | ICD-10-CM

## 2021-01-22 DIAGNOSIS — Z9119 Patient's noncompliance with other medical treatment and regimen: Secondary | ICD-10-CM

## 2021-01-22 DIAGNOSIS — Z87891 Personal history of nicotine dependence: Secondary | ICD-10-CM

## 2021-01-22 DIAGNOSIS — C220 Liver cell carcinoma: Secondary | ICD-10-CM | POA: Diagnosis present

## 2021-01-22 DIAGNOSIS — Z833 Family history of diabetes mellitus: Secondary | ICD-10-CM

## 2021-01-22 DIAGNOSIS — E876 Hypokalemia: Secondary | ICD-10-CM | POA: Diagnosis present

## 2021-01-22 DIAGNOSIS — Z955 Presence of coronary angioplasty implant and graft: Secondary | ICD-10-CM

## 2021-01-22 DIAGNOSIS — I8510 Secondary esophageal varices without bleeding: Secondary | ICD-10-CM | POA: Diagnosis present

## 2021-01-22 DIAGNOSIS — E785 Hyperlipidemia, unspecified: Secondary | ICD-10-CM | POA: Diagnosis present

## 2021-01-22 DIAGNOSIS — R188 Other ascites: Secondary | ICD-10-CM

## 2021-01-22 DIAGNOSIS — Z8505 Personal history of malignant neoplasm of liver: Secondary | ICD-10-CM

## 2021-01-22 DIAGNOSIS — Z7984 Long term (current) use of oral hypoglycemic drugs: Secondary | ICD-10-CM

## 2021-01-22 DIAGNOSIS — M109 Gout, unspecified: Secondary | ICD-10-CM | POA: Diagnosis present

## 2021-01-22 DIAGNOSIS — Z9049 Acquired absence of other specified parts of digestive tract: Secondary | ICD-10-CM

## 2021-01-22 DIAGNOSIS — R509 Fever, unspecified: Secondary | ICD-10-CM | POA: Diagnosis not present

## 2021-01-22 DIAGNOSIS — K766 Portal hypertension: Secondary | ICD-10-CM | POA: Diagnosis present

## 2021-01-22 LAB — URINALYSIS, ROUTINE W REFLEX MICROSCOPIC
Bacteria, UA: NONE SEEN
Bilirubin Urine: NEGATIVE
Glucose, UA: NEGATIVE mg/dL
Hgb urine dipstick: NEGATIVE
Ketones, ur: NEGATIVE mg/dL
Leukocytes,Ua: NEGATIVE
Nitrite: NEGATIVE
Protein, ur: 30 mg/dL — AB
Specific Gravity, Urine: 1.014 (ref 1.005–1.030)
pH: 6 (ref 5.0–8.0)

## 2021-01-22 LAB — CBC WITH DIFFERENTIAL/PLATELET
Abs Immature Granulocytes: 0.05 10*3/uL (ref 0.00–0.07)
Basophils Absolute: 0 10*3/uL (ref 0.0–0.1)
Basophils Relative: 0 %
Eosinophils Absolute: 0 10*3/uL (ref 0.0–0.5)
Eosinophils Relative: 0 %
HCT: 25.2 % — ABNORMAL LOW (ref 39.0–52.0)
Hemoglobin: 7.4 g/dL — ABNORMAL LOW (ref 13.0–17.0)
Immature Granulocytes: 1 %
Lymphocytes Relative: 3 %
Lymphs Abs: 0.2 10*3/uL — ABNORMAL LOW (ref 0.7–4.0)
MCH: 23.6 pg — ABNORMAL LOW (ref 26.0–34.0)
MCHC: 29.4 g/dL — ABNORMAL LOW (ref 30.0–36.0)
MCV: 80.3 fL (ref 80.0–100.0)
Monocytes Absolute: 0.4 10*3/uL (ref 0.1–1.0)
Monocytes Relative: 5 %
Neutro Abs: 7.7 10*3/uL (ref 1.7–7.7)
Neutrophils Relative %: 91 %
Platelets: 108 10*3/uL — ABNORMAL LOW (ref 150–400)
RBC: 3.14 MIL/uL — ABNORMAL LOW (ref 4.22–5.81)
RDW: 22.5 % — ABNORMAL HIGH (ref 11.5–15.5)
WBC: 8.4 10*3/uL (ref 4.0–10.5)
nRBC: 0 % (ref 0.0–0.2)

## 2021-01-22 LAB — COMPREHENSIVE METABOLIC PANEL
ALT: 21 U/L (ref 0–44)
AST: 28 U/L (ref 15–41)
Albumin: 3.6 g/dL (ref 3.5–5.0)
Alkaline Phosphatase: 170 U/L — ABNORMAL HIGH (ref 38–126)
Anion gap: 8 (ref 5–15)
BUN: 15 mg/dL (ref 8–23)
CO2: 25 mmol/L (ref 22–32)
Calcium: 9.3 mg/dL (ref 8.9–10.3)
Chloride: 109 mmol/L (ref 98–111)
Creatinine, Ser: 1.12 mg/dL (ref 0.61–1.24)
GFR, Estimated: >60 mL/min (ref >60)
Glucose, Bld: 159 mg/dL — ABNORMAL HIGH (ref 70–99)
Potassium: 3.5 mmol/L (ref 3.5–5.1)
Sodium: 142 mmol/L (ref 135–145)
Total Bilirubin: 1.9 mg/dL — ABNORMAL HIGH (ref 0.3–1.2)
Total Protein: 6.8 g/dL (ref 6.5–8.1)

## 2021-01-22 LAB — LACTIC ACID, PLASMA
Lactic Acid, Venous: 2.6 mmol/L (ref 0.5–1.9)
Lactic Acid, Venous: 2.5 mmol/L (ref 0.5–1.9)

## 2021-01-22 LAB — APTT: aPTT: 34 seconds (ref 24–36)

## 2021-01-22 LAB — I-STAT VENOUS BLOOD GAS, ED
Acid-Base Excess: 2 mmol/L (ref 0.0–2.0)
Bicarbonate: 26.3 mmol/L (ref 20.0–28.0)
Calcium, Ion: 1.17 mmol/L (ref 1.15–1.40)
HCT: 22 % — ABNORMAL LOW (ref 39.0–52.0)
Hemoglobin: 7.5 g/dL — ABNORMAL LOW (ref 13.0–17.0)
O2 Saturation: 75 %
Potassium: 3.5 mmol/L (ref 3.5–5.1)
Sodium: 144 mmol/L (ref 135–145)
TCO2: 27 mmol/L (ref 22–32)
pCO2, Ven: 37.7 mmHg — ABNORMAL LOW (ref 44.0–60.0)
pH, Ven: 7.452 — ABNORMAL HIGH (ref 7.250–7.430)
pO2, Ven: 38 mmHg (ref 32.0–45.0)

## 2021-01-22 LAB — RESP PANEL BY RT-PCR (FLU A&B, COVID) ARPGX2
Influenza A by PCR: NEGATIVE
Influenza B by PCR: NEGATIVE
SARS Coronavirus 2 by RT PCR: NEGATIVE

## 2021-01-22 LAB — POC OCCULT BLOOD, ED: Fecal Occult Bld: POSITIVE — AB

## 2021-01-22 LAB — PROTIME-INR
INR: 1.3 — ABNORMAL HIGH (ref 0.8–1.2)
Prothrombin Time: 16.6 seconds — ABNORMAL HIGH (ref 11.4–15.2)

## 2021-01-22 LAB — TROPONIN I (HIGH SENSITIVITY)
Troponin I (High Sensitivity): 13 ng/L (ref <18)
Troponin I (High Sensitivity): 12 ng/L (ref <18)

## 2021-01-22 LAB — BRAIN NATRIURETIC PEPTIDE: B Natriuretic Peptide: 583 pg/mL — ABNORMAL HIGH (ref 0.0–100.0)

## 2021-01-22 LAB — AMMONIA: Ammonia: 69 umol/L — ABNORMAL HIGH (ref 9–35)

## 2021-01-22 MED ORDER — SODIUM CHLORIDE 0.9 % IV SOLN: 2.0000 g | Freq: Once | INTRAVENOUS | Status: DC

## 2021-01-22 MED ORDER — VANCOMYCIN HCL 1500 MG/300ML IV SOLN
1500.0000 mg | INTRAVENOUS | Status: DC
  Administered 2021-01-23: 1500 mg via INTRAVENOUS
  Filled 2021-01-22: qty 300

## 2021-01-22 MED ORDER — SODIUM CHLORIDE 0.9 % IV SOLN
2.0000 g | Freq: Three times a day (TID) | INTRAVENOUS | Status: DC
  Administered 2021-01-22 – 2021-01-23 (×4): 2 g via INTRAVENOUS
  Filled 2021-01-22 (×4): qty 2

## 2021-01-22 MED ORDER — VANCOMYCIN HCL 1750 MG/350ML IV SOLN
1750.0000 mg | Freq: Once | INTRAVENOUS | Status: AC
  Administered 2021-01-22: 1750 mg via INTRAVENOUS
  Filled 2021-01-22: qty 350

## 2021-01-22 MED ORDER — LACTATED RINGERS IV BOLUS (SEPSIS)
500.0000 mL | Freq: Once | INTRAVENOUS | Status: AC
  Administered 2021-01-22: 500 mL via INTRAVENOUS

## 2021-01-22 MED ORDER — METRONIDAZOLE 500 MG/100ML IV SOLN
500.0000 mg | Freq: Once | INTRAVENOUS | Status: AC
  Administered 2021-01-22: 500 mg via INTRAVENOUS
  Filled 2021-01-22: qty 100

## 2021-01-22 MED ORDER — LACTULOSE 10 GM/15ML PO SOLN
20.0000 g | Freq: Once | ORAL | Status: AC
  Administered 2021-01-22: 20 g via ORAL
  Filled 2021-01-22: qty 30

## 2021-01-22 MED ORDER — VANCOMYCIN HCL 1000 MG/200ML IV SOLN: 1000.0000 mg | Freq: Once | INTRAVENOUS | Status: DC

## 2021-01-22 NOTE — H&P (Signed)
History and Physical    [name] GYK:[id number] DOB: 18-Apr-1946 DOA: 01/22/2021  PCP: [name], MD   Patient coming from: Home.   I have personally briefly reviewed patient's old medical records in [place]  Chief Complaint: AMS.  HPI: [name] is a 75 y.o. male with medical history significant of osteoarthritis, chronic lower back pain, chronic diastolic CHF, CAD, GERD, hepatic cirrhosis, hepatocellular carcinoma history of PUD and GI bleed, gout, hyperlipidemia, iron deficiency anemia, nephrolithiasis, OSA not on CPAP phimosis, type II DM, essential hypertension who was discharged from the hospital 5 days ago after an 11-day hospitalization due to symptomatic anemia in the setting of recurrent GI bleed requiring 5 PRBC units transfusion and TIPS with embolization of gastric varices by IR on 01/13/2021.  Today, he was brought to the emergency department via EMS due to progressively worse altered mental status for the past 3 days with increased confusion, weakness and chills.  He is fully oriented baseline, but was only oriented to name and unable to provide further information when seen earlier.  He has not been taking lactulose at home.  ED Course: Initial vital signs were temperature 99.6 F, pulse 86, respiration 20, BP 141/57 mmHg O2 sats 99% on room air.  The patient received lactulose 20 g p.o. x1, a 500 mL LR bolus, metronidazole 500 mg IVPB and vancomycin per pharmacy.  Lab work: Fecal occult blood was positive.  Urinalysis show proteinuria 30 mg/dL, was otherwise unremarkable.  CBC showed a white count of 8.4, hemoglobin 7.4 g/dL and platelets 108.  PT 16.6 and INR 1.3.  Lactic acid was 2.6 and then 2.5 mmol/L.  Ammonia level was 69 mol/L.  CMP showed normal electrolytes and renal function.  Glucose 159 mg/dL.  Hepatic functions showed an alkaline phosphatase of 178 units/L and total bilirubin of 1.9 mg/dL.  Troponin was normal x2.  BNP was 583.0  pg/mL.  Imaging: One-view portable chest radiograph showed cardiomegaly with vascular congestion.  CT head without contrast did not show any acute intracranial pathology.  There was paranasal sinus disease.  Please see images and full radiology report for further detail.  Review of Systems: Unable to obtain at this time due to the patient AMS.  Past Medical History:  Diagnosis Date  . Arthritis    "joints tighten up sometimes" (03/27/2104)  . Carcinoma of liver, hepatocellular ([place]) 06/30/2020  . CHF (congestive heart failure) ([place])   . Chronic lower back pain   . Coronary artery disease    a. 05/2012 Cath/PCI: LM 40ost, 50-60d, LAD 99p ruptured plaque (3.0x28 DES), LCX 50p/m, RCA 30-40p, 17m EF 65-70%.  . Coronary artery disease involving native coronary artery of native heart without angina pectoris    Severe left main disease at catheterization July 2015  CABG x3 with a LIMA to the LAD, SVG to the OM, SVG to the PDA on 03/31/14. EF 60% by cath.   . Diastolic heart failure ([facility]   . GERD without esophagitis 08/04/2010  . Hepatic cirrhosis ([place]    a. Dx 01/2014 - CT a/p   . History of blood transfusion    "related to bleeding ulcers"  . History of concussion    1976--  NO RESIDUAL  . History of GI bleed    a. UGIB 07/2012;  b. 01/2014 admission with GIB/FOB stool req 1U prbc's->EGD showed portal gastropathy, barrett's esoph, and chronic active h. pylori gastritis.  .Marland KitchenHistory of gout    2007 &  2008  LEFT LEG-- NO ISSUE SINCE  . Hyperlipidemia   . Iron deficiency anemia   . Kidney stones   . OA (osteoarthritis of spine)    LOWER BACK--  INTERMITTANT LEFT LEG NUMBNESS  . OSA (obstructive sleep apnea)    PULMOLOGIST-  DR CLANCE--  MODERATE OSA  STARTED CPAP 2012--  BUT CURRENTLY HAS NOT USED PAST 6 MONTHS  . Phimosis    a. s/p circumcision 2015.  . Type 2 diabetes mellitus ([place])   . Unspecified essential hypertension    Past Surgical History:  Procedure Laterality Date  . ANKLE  FRACTURE SURGERY Right 1989   "plate put in"  . APPENDECTOMY  05-16-2004   open  . BIOPSY  01/08/2021   Procedure: BIOPSY;  Surgeon: Wilford Corner, MD;  Location: [facility];  Service: Endoscopy;;  . CIRCUMCISION N/A 09/09/2013   Procedure: CIRCUMCISION ADULT;  Surgeon: Bernestine Amass, MD;  Location: [facility];  Service: Urology;  Laterality: N/A;  . COLECTOMY  05-16-2004  . COLONOSCOPY WITH PROPOFOL N/A 11/20/2020   Procedure: COLONOSCOPY WITH PROPOFOL;  Surgeon: Ronald Lobo, MD;  Location: [facility];  Service: Endoscopy;  Laterality: N/A;  . CORONARY ANGIOPLASTY WITH STENT PLACEMENT  06/28/2012  DR COOPER   PCI W/  X1 DES to [place]. LAD/  LM  40% OSTIAL & 50-60% DISTAL /  50% PROX LCX/  30-40% PROX RCA & 50% MID RCA/   LVEF 65-70%  . CORONARY ARTERY BYPASS GRAFT N/A 03/31/2014   Procedure: CORONARY ARTERY BYPASS GRAFTING (CABG) times 3 using left internal mammary artery and right saphenous vein.;  Surgeon: Melrose Nakayama, MD;  Location: [place];  Service: Open Heart Surgery;  Laterality: N/A;  . DOBUTAMINE STRESS ECHO  06-08-2012   MODERATE HYPOKINESIS/ ISCHEMIA MID INFERIOR WALL  . ESOPHAGOGASTRODUODENOSCOPY  08/15/2012   Procedure: ESOPHAGOGASTRODUODENOSCOPY (EGD);  Surgeon: Wonda Horner, MD;  Location[facility] END[facility]COPY;  Service: Endoscopy;  Laterality: N/A;  . ESOPHAGOGASTRODUODENOSCOPY N/A 02/17/2014   Procedure: ESOPHAGOGASTRODUODENOSCOPY (EGD);  Surgeon: Jeryl Columbia, MD;  Location: [facility] ENDOSCOPY;  Service: Endoscopy;  Laterality: N/A;  . ESOPHAGOGASTRODUODENOSCOPY N/A 04/17/2020   Procedure: ESOPHAGOGASTRODUODENOSCOPY (EGD);  Surgeon: Ronnette Juniper, MD;  Location: [place];  Service: Gastroenterology;  Laterality: N/A;  . ESOPHAGOGASTRODUODENOSCOPY N/A 09/11/2020   Procedure: ESOPHAGOGASTRODUODENOSCOPY (EGD);  Surgeon: Clarene Essex, MD;  Location: [facility];  Service: Endoscopy;  Laterality: N/A;  . ESOPHAGOGASTRODUODENOSCOPY  10/09/2020  .  ESOPHAGOGASTRODUODENOSCOPY N/A 10/09/2020   Procedure: ESOPHAGOGASTRODUODENOSCOPY (EGD);  Surgeon: Otis Brace, MD;  Location: [facility] ENDOSCOPY;  Service: Gastroenterology;  Laterality: N/A;  . ESOPHAGOGASTRODUODENOSCOPY N/A 01/08/2021   Procedure: ESOPHAGOGASTRODUODENOSCOPY (EGD);  Surgeon: Wilford Corner, MD;  Location: [facility];  Service: Endoscopy;  Laterality: N/A;  . ESOPHAGOGASTRODUODENOSCOPY (EGD) WITH PROPOFOL N/A 09/30/2017   Procedure: ESOPHAGOGASTRODUODENOSCOPY (EGD) WITH PROPOFOL;  Surgeon: Wonda Horner, MD;  Location: [facility] ENDOSCOPY;  Service: Endoscopy;  Laterality: N/A;  . ESOPHAGOGASTRODUODENOSCOPY (EGD) WITH PROPOFOL N/A 12/20/2017   Procedure: ESOPHAGOGASTRODUODENOSCOPY (EGD) WITH PROPOFOL;  Surgeon: Clarene Essex, MD;  Location: [place];  Service: Endoscopy;  Laterality: N/A;  . ESOPHAGOGASTRODUODENOSCOPY (EGD) WITH PROPOFOL N/A 08/10/2020   Procedure: ESOPHAGOGASTRODUODENOSCOPY (EGD) WITH PROPOFOL;  Surgeon: Wilford Corner, MD;  Location: [place];  Service: Endoscopy;  Laterality: N/A;  . ESOPHAGOGASTRODUODENOSCOPY (EGD) WITH PROPOFOL N/A 11/20/2020   Procedure: ESOPHAGOGASTRODUODENOSCOPY (EGD) WITH PROPOFOL;  Surgeon: Ronald Lobo, MD;  [facility];  Service: Endoscopy;  Laterality: N/A;  . GIVENS CAPSULE STUDY N/A 11/20/2020   Procedure: GIVENS  CAPSULE STUDY;  Surgeon: Ronald Lobo, MD;  Location: [facility];  Service: Endoscopy;  Laterality: N/A;  . HERNIA REPAIR    . INTRAOPERATIVE TRANSESOPHAGEAL ECHOCARDIOGRAM N/A 03/31/2014   Procedure: INTRAOPERATIVE TRANSESOPHAGEAL ECHOCARDIOGRAM;  Surgeon: Melrose Nakayama, MD;  Location: [place];  Service: Open Heart Surgery;  Laterality: N/A;  . IR ANGIOGRAM SELECTIVE EACH ADDITIONAL VESSEL  06/16/2020  . IR ANGIOGRAM SELECTIVE EACH ADDITIONAL VESSEL  06/16/2020  . IR ANGIOGRAM SELECTIVE EACH ADDITIONAL VESSEL  06/16/2020  . IR ANGIOGRAM SELECTIVE EACH ADDITIONAL VESSEL  06/16/2020  . IR ANGIOGRAM  SELECTIVE EACH ADDITIONAL VESSEL  06/16/2020  . IR ANGIOGRAM SELECTIVE EACH ADDITIONAL VESSEL  06/16/2020  . IR ANGIOGRAM SELECTIVE EACH ADDITIONAL VESSEL  06/16/2020  . IR ANGIOGRAM SELECTIVE EACH ADDITIONAL VESSEL  06/16/2020  . IR ANGIOGRAM SELECTIVE EACH ADDITIONAL VESSEL  06/16/2020  . IR ANGIOGRAM SELECTIVE EACH ADDITIONAL VESSEL  06/30/2020  . IR ANGIOGRAM SELECTIVE EACH ADDITIONAL VESSEL  06/30/2020  . IR ANGIOGRAM VISCERAL SELECTIVE  06/16/2020  . IR ANGIOGRAM VISCERAL SELECTIVE  06/30/2020  . IR EMBO ARTERIAL NOT HEMORR HEMANG INC GUIDE ROADMAPPING  06/16/2020  . IR EMBO TUMOR ORGAN ISCHEMIA INFARCT INC GUIDE ROADMAPPING  06/30/2020  . IR EMBO VENOUS NOT HEMORR HEMANG  INC GUIDE ROADMAPPING  01/13/2021  . IR IVUS EACH ADDITIONAL NON CORONARY VESSEL  01/13/2021  . IR RADIOLOGIST EVAL & MGMT  05/06/2020  . IR RADIOLOGIST EVAL & MGMT  05/20/2020  . IR TIPS  01/13/2021  . IR US GUIDE VASC ACCESS RIGHT  06/16/2020  . IR US GUIDE VASC ACCESS RIGHT  06/30/2020  . IR US GUIDE VASC ACCESS RIGHT  01/13/2021  . LAPAROSCOPIC UMBILICAL HERNIA REPAIR W/ MESH  06-06-2011  . LEFT HEART CATHETERIZATION WITH CORONARY ANGIOGRAM N/A 03/28/2014   Procedure: LEFT HEART CATHETERIZATION WITH CORONARY ANGIOGRAM;  Surgeon: Sinclair Grooms, MD;  Location: [facility] CATH LAB;  Service: Cardiovascular;  Laterality: N/A;  . LIPOMA EXCISION Left 08/25/2017   Procedure: EXCISION LIPOMA LEFT POSTERIOR THIGH;  Surgeon: Clovis Riley, MD;  Location: [place];  Service: General;  Laterality: Left;  . NEPHROLITHOTOMY  1990'S  . OPEN APPENDECTOMY W/ PARTIAL CECECTOMY  05-16-2004  . PERCUTANEOUS CORONARY STENT INTERVENTION (PCI-S) N/A 06/28/2012   Procedure: PERCUTANEOUS CORONARY STENT INTERVENTION (PCI-S);  Surgeon: Sherren Mocha, MD;  Location: [facility] CATH LAB;  Service: Cardiovascular;  Laterality: N/A;  . POLYPECTOMY  11/20/2020   Procedure: POLYPECTOMY;  Surgeon: Ronald Lobo, MD;  [facility];  Service: Endoscopy;;   . RADIOLOGY WITH ANESTHESIA N/A 01/13/2021   Procedure: IR WITH ANESTHESIA - TIPS;  Surgeon: Suzette Battiest, MD;  Location: [place];  Service: Radiology;  Laterality: N/A;   Social History  reports that he quit smoking about 24 years ago. His smoking use included cigarettes. He has a 80.00 pack-year smoking history. He has never used smokeless tobacco. He reports current alcohol use of about 8.0 standard drinks of alcohol per week. He reports that he does not use drugs.  Allergies  Allergen Reactions  . Fluzone Quadrivalent [Influenza Vac Split Quad] Other (See Comments)    The patient stated, in 10/2020: "I am not taking any more flu shots. It liked to have killed me."   Family History  Problem Relation Age of Onset  . Lung cancer Sister   . Cancer Sister        lung  . Cancer Mother   . Cancer Father        died  in his 55s.  . Cancer Brother        lung  . Coronary artery disease Other   . Diabetes Other   . Colon cancer Other   . Cancer Sister        lung   Prior to Admission medications   Medication Sig Start Date End Date Taking? Authorizing Provider  Accu-Chek Softclix Lancets lancets USE AS DIRECTED TO TEST BLOOD SUGAR FOUR TIMES DAILY 03/13/20   [name], MD  albuterol (VENTOLIN HFA) 108 (90 Base) MCG/ACT inhaler Inhale 2 puffs into the lungs every 6 (six) hours as needed for wheezing or shortness of breath. 03/12/20   [name], MD  atorvastatin (LIPITOR) 40 MG tablet TAKE 1 TABLET BY MOUTH DAILY AT 6 PM Patient taking differently: Take 40 mg by mouth daily at 6 PM. 01/04/21   [name], MD  blood glucose meter kit and supplies Dispense based on patient and insurance preference. Use up to four times daily as directed. (FOR ICD-10 E10.9, E11.9). 03/12/20   [name], MD  gabapentin (NEURONTIN) 100 MG capsule TAKE 1 CAPSULE(100 MG) BY MOUTH TWICE DAILY Patient taking differently: Take 100 mg by mouth 2 (two) times daily. 08/03/20    [name], MD  glimepiride (AMARYL) 2 MG tablet TAKE 1 TABLET(2 MG) BY MOUTH DAILY BEFORE BREAKFAST Patient taking differently: Take 2 mg by mouth daily with breakfast. 12/16/20   [name], MD  hydrocortisone (ANUSOL-HC) 2.5 % rectal cream Place 1 application rectally 2 (two) times daily. Patient taking differently: Place 1 application rectally 2 (two) times daily as needed for hemorrhoids. 12/04/20   [name], MD  lactulose (CHRONULAC) 10 GM/15ML solution Take 30 mLs (20 g total) by mouth 3 (three) times daily. 01/17/21   Dessa Phi, DO  metFORMIN (GLUCOPHAGE) 850 MG tablet TAKE 1 TABLET(850 MG) BY MOUTH TWICE DAILY WITH A MEAL 01/08/21   [name], MD  nitroGLYCERIN (NITROSTAT) 0.4 MG SL tablet Place 1 tablet (0.4 mg total) under the tongue every 5 (five) minutes as needed for chest pain. 06/08/16   Nahser, Wonda Cheng, MD  pantoprazole (PROTONIX) 40 MG tablet Take 1 tablet (40 mg total) by mouth 2 (two) times daily. Patient not taking: No sig reported 09/12/20   Pokhrel, Corrie Mckusick, MD  polyethylene glycol powder (GLYCOLAX/MIRALAX) 17 GM/SCOOP powder Take 8.5 g by mouth in the morning, at noon, and at bedtime.    [provider]  Psyllium (METAMUCIL PO) Take 15 mLs by mouth 3 (three) times daily with meals. Mix with 4 oz of juice or water    [provider]  spironolactone (ALDACTONE) 25 MG tablet Take 1 tablet (25 mg total) by mouth daily. 08/18/20   Danford, Suann Larry, MD  sucralfate (CARAFATE) 1 g tablet Take 1 tablet (1 g total) by mouth 4 (four) times daily. Patient taking differently: Take 2 g by mouth in the morning and at bedtime. 11/23/20 12/23/20  Elodia Florence., MD  torsemide (DEMADEX) 20 MG tablet Take 2 tablets (40 mg total) by mouth 2 (two) times daily. 08/18/20   Danford, Suann Larry, MD  umeclidinium-vilanterol (ANORO ELLIPTA) 62.5-25 MCG/INH AEPB Inhale 1 puff into the lungs daily. 03/12/20   [name], MD   Physical Exam: Vitals:   01/22/21 2045 01/22/21 2130 01/22/21 2215 01/22/21 2300  BP: (!) 111/41 (!) 117/44 (!) 109/44 (!) 118/41  Pulse: 84 83 85 83  Resp: 19 (!) 23 (!)  21 (!) 33  Temp:      TempSrc:      SpO2: 98% 98% 98% 99%   Constitutional: NAD, calm, comfortable Eyes: PERRL, lids and conjunctivae normal. ENMT: Mucous membranes are moist. Posterior pharynx clear of any exudate or lesions. Neck: normal, supple, no masses, no thyromegaly Respiratory: Decreased breath sounds in bases, otherwise clear to auscultation bilaterally, no wheezing, no crackles. Normal respiratory effort. No accessory muscle use.  Cardiovascular: Regular rate and rhythm, no murmurs / rubs / gallops. No extremity edema. 2+ pedal pulses. No carotid bruits.  Abdomen: Obese, mild distention.  Bowel sounds positive.  Soft, no tenderness, no masses palpated. No hepatosplenomegaly.  Musculoskeletal: Moderate to severe generalized weakness.  No clubbing / cyanosis.  Good ROM, no contractures.  Relaxed muscle tone.  Skin: Multiple areas of ecchymosis from venipuncture on extremities Neurologic: CN 2-12 grossly intact. Sensation intact, DTR normal. Strength 5/5 in all 4.  Psychiatric: Somnolent, wakes up, but is oriented to name only.  Labs on Admission: I have personally reviewed following labs and imaging studies  CBC: Recent Labs  Lab 01/16/21 0228 01/17/21 0118 01/22/21 1558 01/22/21 1808  WBC 2.9* 3.0* 8.4  --   NEUTROABS  --   --  7.7  --   HGB 7.3* 7.3* 7.4* 7.5*  HCT 25.2* 25.2* 25.2* 22.0*  MCV 79.7* 80.5 80.3  --   PLT 67* 73* 108*  --     Basic Metabolic Panel: Recent Labs  Lab 01/16/21 0228 01/17/21 0118 01/22/21 1558 01/22/21 1808  NA 137 137 142 144  K 4.3 3.9 3.5 3.5  CL 103 103 109  --   CO2 _0 --   GLUCOSE 113* 121* 159*  --   BUN 24* 21 15  --   CREATININE 1.43* 1.22 1.12  --   CALCIUM 8.7* 8.6* 9.3  --    GFR: Estimated Creatinine Clearance: 69.2 mL/min (by  C-G formula based on SCr of 1.12 mg/dL).  Liver Function Tests: Recent Labs  Lab 01/16/21 0228 01/22/21 1558  AST 34 28  ALT 18 21  ALKPHOS 137* 170*  BILITOT 0.8 1.9*  PROT 6.0* 6.8  ALBUMIN 3.2* 3.6   Urine analysis:    Component Value Date/Time   COLORURINE YELLOW 01/22/2021 1856   APPEARANCEUR CLEAR 01/22/2021 1856   LABSPEC 1.014 01/22/2021 1856   PHURINE 6.0 01/22/2021 1856   GLUCOSEU NEGATIVE 01/22/2021 1856   HGBUR NEGATIVE 01/22/2021 1856   BILIRUBINUR NEGATIVE 01/22/2021 1856   KETONESUR NEGATIVE 01/22/2021 1856   PROTEINUR 30 (A) 01/22/2021 1856        NITRITE NEGATIVE 01/22/2021 1856   LEUKOCYTESUR NEGATIVE 01/22/2021 1856   Radiological Exams on Admission: CT Head Wo Contrast  Result Date: 01/22/2021 CLINICAL DATA:  75 year old male with delirium. EXAM: CT HEAD WITHOUT CONTRAST TECHNIQUE: Contiguous axial images were obtained from the base of the skull through the vertex without intravenous contrast. COMPARISON:  Head CT dated 05/25/2018. FINDINGS: Brain: The ventricles and sulci appropriate size for patient's age. The gray-white matter discrimination is preserved. There is no acute intracranial hemorrhage. No mass effect or midline shift. No extra-axial fluid collection. Vascular: No hyperdense vessel or unexpected calcification. Skull: Normal. Negative for fracture or focal lesion. Sinuses/Orbits: There is diffuse mucoperiosteal thickening of paranasal sinuses. No air-fluid level. The mastoid air cells are clear. Other: None IMPRESSION: 1. No acute intracranial pathology. 2. Paranasal sinus disease. Electronically Signed   By: Anner Crete M.D.   On: 01/22/2021  18:33   DG Chest Port 1 View  Result Date: 01/22/2021 CLINICAL DATA:  75 year old male with concern for sepsis. EXAM: PORTABLE CHEST 1 VIEW COMPARISON:  Chest radiograph dated 11/20/2020. FINDINGS: There is cardiomegaly with vascular congestion. No focal consolidation, pleural effusion, or pneumothorax.  Median sternotomy wires and CABG vascular clips. No acute osseous pathology. IMPRESSION: Cardiomegaly with vascular congestion.  No focal consolidation. Electronically Signed   By: Anner Crete M.D.   On: 01/22/2021 16:29    EKG: Independently reviewed.  Vent. rate 86 BPM PR interval 246 ms QRS duration 111 ms QT/QTcB 414/496 ms P-R-T axes 69 95 60 Sinus rhythm Prolonged PR interval Right axis deviation Borderline prolonged QT interval  Assessment/Plan Principal Problem:   Hepatic encephalopathy (HCC) Observation/PCU. Neurochecks every 4 hours. Resume lactulose. Monitor ammonia level. Continue diuretics.  Active Problems:   Alcoholic cirrhosis of liver with ascites (HCC) Resume lactulose. Continue soluble fiber. Continue spironolactone 25 mg p.o. daily. Continue torsemide 40 mg p.o. twice daily.    Hepatocellular carcinoma ([place]) Treated in November 2021 per GI.    Type 2 diabetes with complication (HCC) Carbohydrate modified diet. Continue Amaryl 2 mg p.o. twice daily. Continue metformin 850 mg p.o. twice daily.    GERD without esophagitis Continue pantoprazole 40 mg p.o. twice daily.    Hyperlipidemia with target LDL less than 70 Hold atorvastatin at the moment.    CKD (chronic kidney disease), stage III (HCC) Monitor renal function electrolytes. Avoid nephrotoxic medications.   DVT prophylaxis: Lovenox SQ.  Code Status:   Full code. Family Communication:   Disposition Plan:   Patient is from:  Home.  Anticipated DC to:  Home.  Anticipated DC date:  01/23/2021 or 01/24/2021.  Anticipated DC barriers: Clinical status.  Consults called:        If needed, please message Dr. Cristina Gong. Admission status:  Observation/PCU.  Severity of Illness:  High severity due to presenting with altered mental status in the setting of hepatic encephalopathy.  Patient will need to remain for 24 to 48 hours observation and treatment.  Reubin Milan MD [facility]  How to contact the [facility] Attending or Consulting provider [place] or covering provider during after hours [place], for this patient?   1. Check the care team in [facility] and look for a) attending/consulting [facility] provider listed and b) the [facility] team listed 2. Log into [email or web address] and use [facility]'s universal password to access. If you do not have the password, please contact the hospital operator. 3. Locate the [facility] provider you are looking for under [facility] and page to a number that you can be directly reached. 4. If you still have difficulty reaching the provider, please page the [facility] (Director on Call) for the Hospitalists listed on amion for assistance.  01/22/2021, 11:52 PM   This document was prepared using Dragon voice recognition software and may contain some unintended transcription errors.

## 2021-01-22 NOTE — ED Notes (Signed)
Dr. Roslynn Amble made aware of critical lab lactic of 2.6

## 2021-01-22 NOTE — Sepsis Progress Note (Signed)
Notified provider of need to order repeat lactic acid. ° °

## 2021-01-22 NOTE — Progress Notes (Signed)
Pharmacy Antibiotic Note  [name] is a 75 y.o. male admitted on 01/22/2021 with sepsis.  Pharmacy has been consulted for vancomycin and cefepime dosing. Patient presents to ED from home with complaints of AMS. Symptoms began approx. 3 days ago and nausea, chills, vomiting have increased steadily. Of note, PMH significant for hepatic cirrhosis, hepatocellular carcinoma s/p treatment November 2021, CAD s/p CABG, CKD stage III, T2DM, HTN, and recurrent anemia. Patient has been afebrile since presenting to ED.   Plan: Cefepime 2 G Q8H  Vancomycin 1750 MG x1 followed by 1500 MG Q24H (predicted AUC 447, Scr  1.12, Wt 108) Monitor renal function, clinical status, and treatment plan  Vancomycin levels as needed      Temp (24hrs), Avg:99.6 F (37.6 C), Min:99.6 F (37.6 C), Max:99.6 F (37.6 C)  Recent Labs  Lab 01/16/21 0228 01/17/21 0118  WBC 2.9* 3.0*  CREATININE 1.43* 1.22    Estimated Creatinine Clearance: 63.5 mL/min (by C-G formula based on SCr of 1.22 mg/dL).    Allergies  Allergen Reactions  . Fluzone Quadrivalent [Influenza Vac Split Quad] Other (See Comments)    The patient stated, in 10/2020: "I am not taking any more flu shots. It liked to have killed me."    Antimicrobials this admission: 5/27 Cefepime>>  5/27 Vanc >> 5/27 Flagyl x1   Dose adjustments this admission:   Microbiology results: 5/27 BCx: collected 5/27 UCx: collected  Thank you for allowing pharmacy to be a part of this patient's care.  Cephus Slater, PharmD, [facility] Resident [phone] 01/22/2021 7:24 PM

## 2021-01-22 NOTE — ED Provider Notes (Signed)
[facility] EMERGENCY DEPARTMENT Provider Note   CSN: [id number] Arrival date & time: 01/22/21  1538     History Chief Complaint  Patient presents with  . Altered Mental Status    [name] is a 75 y.o. male.  Presented to ER with concern for altered mental status.  Family reports that since coming home from the hospital he initially was doing well but then over the last couple days he has had steadily worsening mental status.  Increasing lethargy, general confusion.  Had some nausea and one episode of vomiting.  Denies any fevers at home.  No cough.  No complaints of neck pain neck stiffness or headache.  hepatic cirrhosis, portal hypertension, esophageal varices, recurrent anemia, hepatocellular carcinoma status posttreatment in November 2021, CAD s/p CABG, diastolic CHF, EF 72%, CKD stage IIIa, asthma, diabetes mellitus type 2, hypertension, hyperlipidemia  Recent admission for recurrent GI bleed, symptomatic anemia, pancytopenia, decompensated cirrhosis, status post TIPS procedure, embolization of gastric varices 5/18.  HPI     Past Medical History:  Diagnosis Date  . Arthritis    "joints tighten up sometimes" (03/27/2104)  . Carcinoma of liver, hepatocellular ([place]) 06/30/2020  . CHF (congestive heart failure) ([place])   . Chronic lower back pain   . Coronary artery disease    a. 05/2012 Cath/PCI: LM 40ost, 50-60d, LAD 99p ruptured plaque (3.0x28 DES), LCX 50p/m, RCA 30-40p, 53m EF 65-70%.  . Coronary artery disease involving native coronary artery of native heart without angina pectoris    Severe left main disease at catheterization July 2015  CABG x3 with a LIMA to the LAD, SVG to the OM, SVG to the PDA on 03/31/14. EF 60% by cath.   . Diastolic heart failure ([place]   . GERD without esophagitis 08/04/2010  . Hepatic cirrhosis ([place]    a. Dx 01/2014 - CT a/p   . History of blood transfusion    "related to bleeding ulcers"  . History of concussion    1976--   NO RESIDUAL  . History of GI bleed    a. UGIB 07/2012;  b. 01/2014 admission with GIB/FOB stool req 1U prbc's->EGD showed portal gastropathy, barrett's esoph, and chronic active h. pylori gastritis.  .Marland KitchenHistory of gout    2007 &  2008  LEFT LEG-- NO ISSUE SINCE  . Hyperlipidemia   . Iron deficiency anemia   . Kidney stones   . OA (osteoarthritis of spine)    LOWER BACK--  INTERMITTANT LEFT LEG NUMBNESS  . OSA (obstructive sleep apnea)    PULMOLOGIST-  DR CLANCE--  MODERATE OSA  STARTED CPAP 2012--  BUT CURRENTLY HAS NOT USED PAST 6 MONTHS  . Phimosis    a. s/p circumcision 2015.  . Type 2 diabetes mellitus ([place]   . Unspecified essential hypertension     Patient Active Problem List   Diagnosis Date Noted  . Hepatic encephalopathy ([place] 01/22/2021  . GI bleeding 09/10/2020  . CKD (chronic kidney disease), stage II   . Hepatocellular carcinoma ([place] 08/09/2020  . Routine general medical examination at a health care facility 05/06/2020  . Iron deficiency anemia   . Symptomatic anemia 04/15/2020  . Pulmonary hypertension, unspecified ([facility] 03/11/2020  . Asthma, mild intermittent 12/27/2019  . Alcoholic cirrhosis of liver with ascites ([place] 12/27/2019  . (HFpEF) heart failure with preserved ejection fraction ([facility] 12/24/2018  . CKD (chronic kidney disease), stage III ([facility] 12/18/2017  . Peptic ulcer disease 12/18/2017  . GI bleed 09/29/2017  .  Morbid obesity ([place]) 02/05/2016  . S/P CABG x 3 03/31/2014  . Hepatic cirrhosis ([place]) 02/27/2014  . Melena 08/14/2012  . Hyperlipidemia with target LDL less than 70 06/29/2012  . Thrombocytopenia ([place]) 06/29/2012  . Coronary artery disease involving native coronary artery of native heart without angina pectoris   . OSA (obstructive sleep apnea)   . Type 2 diabetes with complication ([place]) 77/41/4239  . GERD without esophagitis 08/04/2010  . History of gout   . Hypertensive heart disease     Past Surgical History:  Procedure Laterality  Date  . ANKLE FRACTURE SURGERY Right 1989   "plate put in"  . APPENDECTOMY  05-16-2004   open  . BIOPSY  01/08/2021   Procedure: BIOPSY;  Surgeon: Wilford Corner, MD;  Location: [facility];  Service: Endoscopy;;  . CIRCUMCISION N/A 09/09/2013   Procedure: CIRCUMCISION ADULT;  Surgeon: Bernestine Amass, MD;  Location[facility];  Service: Urology;  Laterality: N/A;  . COLECTOMY  05-16-2004  . COLONOSCOPY WITH PROPOFOL N/A 11/20/2020   Procedure: COLONOSCOPY WITH PROPOFOL;  Surgeon: Ronald Lobo, MD;  Location: [facility];  Service: Endoscopy;  Laterality: N/A;  . CORONARY ANGIOPLASTY WITH STENT PLACEMENT  06/28/2012  DR COOPER   PCI W/  X1 DES to [place]. LAD/  LM  40% OSTIAL & 50-60% DISTAL /  50% PROX LCX/  30-40% PROX RCA & 50% MID RCA/   LVEF 65-70%  . CORONARY ARTERY BYPASS GRAFT N/A 03/31/2014   Procedure: CORONARY ARTERY BYPASS GRAFTING (CABG) times 3 using left internal mammary artery and right saphenous vein.;  Surgeon: Melrose Nakayama, MD;  Location: [facility];  Service: Open Heart Surgery;  Laterality: N/A;  . DOBUTAMINE STRESS ECHO  06-08-2012   MODERATE HYPOKINESIS/ ISCHEMIA MID INFERIOR WALL  . ESOPHAGOGASTRODUODENOSCOPY  08/15/2012   Procedure: ESOPHAGOGASTRODUODENOSCOPY (EGD);  Surgeon: Wonda Horner, MD;  Location: [facility] ENDOSCOPY;  Service: Endoscopy;  Laterality: N/A;  . ESOPHAGOGASTRODUODENOSCOPY N/A 02/17/2014   Procedure: ESOPHAGOGASTRODUODENOSCOPY (EGD);  Surgeon: Jeryl Columbia, MD;  Location: [facility] ENDOSCOPY;  Service: Endoscopy;  Laterality: N/A;  . ESOPHAGOGASTRODUODENOSCOPY N/A 04/17/2020   Procedure: ESOPHAGOGASTRODUODENOSCOPY (EGD);  Surgeon: Ronnette Juniper, MD;  Location: [facility];  Service: Gastroenterology;  Laterality: N/A;  . ESOPHAGOGASTRODUODENOSCOPY N/A 09/11/2020   Procedure: ESOPHAGOGASTRODUODENOSCOPY (EGD);  Surgeon: Clarene Essex, MD;  Location: [facility];  Service: Endoscopy;  Laterality: N/A;  . ESOPHAGOGASTRODUODENOSCOPY  10/09/2020  .  ESOPHAGOGASTRODUODENOSCOPY N/A 10/09/2020   Procedure: ESOPHAGOGASTRODUODENOSCOPY (EGD);  Surgeon: Otis Brace, MD;  Location: [facility] ENDOSCOPY;  Service: Gastroenterology;  Laterality: N/A;  . ESOPHAGOGASTRODUODENOSCOPY N/A 01/08/2021   Procedure: ESOPHAGOGASTRODUODENOSCOPY (EGD);  Surgeon: Wilford Corner, MD;  Location: [facility];  Service: Endoscopy;  Laterality: N/A;  . ESOPHAGOGASTRODUODENOSCOPY (EGD) WITH PROPOFOL N/A 09/30/2017   Procedure: ESOPHAGOGASTRODUODENOSCOPY (EGD) WITH PROPOFOL;  Surgeon: Wonda Horner, MD;  Location: [facility] ENDOSCOPY;  Service: Endoscopy;  Laterality: N/A;  . ESOPHAGOGASTRODUODENOSCOPY (EGD) WITH PROPOFOL N/A 12/20/2017   Procedure: ESOPHAGOGASTRODUODENOSCOPY (EGD) WITH PROPOFOL;  Surgeon: Clarene Essex, MD;  Location: [facility];  Service: Endoscopy;  Laterality: N/A;  . ESOPHAGOGASTRODUODENOSCOPY (EGD) WITH PROPOFOL N/A 08/10/2020   Procedure: ESOPHAGOGASTRODUODENOSCOPY (EGD) WITH PROPOFOL;  Surgeon: Wilford Corner, MD;  Location: [facility];  Service: Endoscopy;  Laterality: N/A;  . ESOPHAGOGASTRODUODENOSCOPY (EGD) WITH PROPOFOL N/A 11/20/2020   Procedure: ESOPHAGOGASTRODUODENOSCOPY (EGD) WITH PROPOFOL;  Surgeon: Ronald Lobo, MD;  [facility];  Service: Endoscopy;  Laterality: N/A;  . GIVENS CAPSULE STUDY N/A 11/20/2020   Procedure: GIVENS CAPSULE STUDY;  Surgeon:  Ronald Lobo, MD;  Location: [facility];  Service: Endoscopy;  Laterality: N/A;  . HERNIA REPAIR    . INTRAOPERATIVE TRANSESOPHAGEAL ECHOCARDIOGRAM N/A 03/31/2014   Procedure: INTRAOPERATIVE TRANSESOPHAGEAL ECHOCARDIOGRAM;  Surgeon: Melrose Nakayama, MD;  Location: [facility];  Service: Open Heart Surgery;  Laterality: N/A;  . IR ANGIOGRAM SELECTIVE EACH ADDITIONAL VESSEL  06/16/2020  . IR ANGIOGRAM SELECTIVE EACH ADDITIONAL VESSEL  06/16/2020  . IR ANGIOGRAM SELECTIVE EACH ADDITIONAL VESSEL  06/16/2020  . IR ANGIOGRAM SELECTIVE EACH ADDITIONAL VESSEL  06/16/2020  . IR ANGIOGRAM  SELECTIVE EACH ADDITIONAL VESSEL  06/16/2020  . IR ANGIOGRAM SELECTIVE EACH ADDITIONAL VESSEL  06/16/2020  . IR ANGIOGRAM SELECTIVE EACH ADDITIONAL VESSEL  06/16/2020  . IR ANGIOGRAM SELECTIVE EACH ADDITIONAL VESSEL  06/16/2020  . IR ANGIOGRAM SELECTIVE EACH ADDITIONAL VESSEL  06/16/2020  . IR ANGIOGRAM SELECTIVE EACH ADDITIONAL VESSEL  06/30/2020  . IR ANGIOGRAM SELECTIVE EACH ADDITIONAL VESSEL  06/30/2020  . IR ANGIOGRAM VISCERAL SELECTIVE  06/16/2020  . IR ANGIOGRAM VISCERAL SELECTIVE  06/30/2020  . IR EMBO ARTERIAL NOT HEMORR HEMANG INC GUIDE ROADMAPPING  06/16/2020  . IR EMBO TUMOR ORGAN ISCHEMIA INFARCT INC GUIDE ROADMAPPING  06/30/2020  . IR EMBO VENOUS NOT HEMORR HEMANG  INC GUIDE ROADMAPPING  01/13/2021  . IR IVUS EACH ADDITIONAL NON CORONARY VESSEL  01/13/2021  . IR RADIOLOGIST EVAL & MGMT  05/06/2020  . IR RADIOLOGIST EVAL & MGMT  05/20/2020  . IR TIPS  01/13/2021  . IR US GUIDE VASC ACCESS RIGHT  06/16/2020  . IR US GUIDE VASC ACCESS RIGHT  06/30/2020  . IR US GUIDE VASC ACCESS RIGHT  01/13/2021  . LAPAROSCOPIC UMBILICAL HERNIA REPAIR W/ MESH  06-06-2011  . LEFT HEART CATHETERIZATION WITH CORONARY ANGIOGRAM N/A 03/28/2014   Procedure: LEFT HEART CATHETERIZATION WITH CORONARY ANGIOGRAM;  Surgeon: Sinclair Grooms, MD;  Location: [facility] CATH LAB;  Service: Cardiovascular;  Laterality: N/A;  . LIPOMA EXCISION Left 08/25/2017   Procedure: EXCISION LIPOMA LEFT POSTERIOR THIGH;  Surgeon: Clovis Riley, MD;  Location: [facility];  Service: General;  Laterality: Left;  . NEPHROLITHOTOMY  1990'S  . OPEN APPENDECTOMY W/ PARTIAL CECECTOMY  05-16-2004  . PERCUTANEOUS CORONARY STENT INTERVENTION (PCI-S) N/A 06/28/2012   Procedure: PERCUTANEOUS CORONARY STENT INTERVENTION (PCI-S);  Surgeon: Sherren Mocha, MD;  Location: [facility] CATH LAB;  Service: Cardiovascular;  Laterality: N/A;  . POLYPECTOMY  11/20/2020   Procedure: POLYPECTOMY;  Surgeon: Ronald Lobo, MD;  [facility];  Service: Endoscopy;;   . RADIOLOGY WITH ANESTHESIA N/A 01/13/2021   Procedure: IR WITH ANESTHESIA - TIPS;  Surgeon: Suzette Battiest, MD;  Location: [facility];  Service: Radiology;  Laterality: N/A;       Family History  Problem Relation Age of Onset  . Lung cancer Sister   . Cancer Sister        lung  . Cancer Mother   . Cancer Father        died in his 39s.  . Cancer Brother        lung  . Coronary artery disease Other   . Diabetes Other   . Colon cancer Other   . Cancer Sister        lung    Social History   Tobacco Use  . Smoking status: Former Smoker    Packs/day: 2.00    Years: 40.00    Pack years: 80.00    Types: Cigarettes    Quit date: 08/29/1996    Years since  quitting: 24.4  . Smokeless tobacco: Never Used  Vaping Use  . Vaping Use: Never used  Substance Use Topics  . Alcohol use: Yes    Alcohol/week: 8.0 standard drinks    Types: 8 Cans of beer per week    Comment: 2 beers every other day  . Drug use: No    Home Medications Prior to Admission medications   Medication Sig Start Date End Date Taking? Authorizing Provider  Accu-Chek Softclix Lancets lancets USE AS DIRECTED TO TEST BLOOD SUGAR FOUR TIMES DAILY 03/13/20   [name], MD  albuterol (VENTOLIN HFA) 108 (90 Base) MCG/ACT inhaler Inhale 2 puffs into the lungs every 6 (six) hours as needed for wheezing or shortness of breath. 03/12/20   [name], MD  atorvastatin (LIPITOR) 40 MG tablet TAKE 1 TABLET BY MOUTH DAILY AT 6 PM Patient taking differently: Take 40 mg by mouth daily at 6 PM. 01/04/21   [name], MD  blood glucose meter kit and supplies Dispense based on patient and insurance preference. Use up to four times daily as directed. (FOR ICD-10 E10.9, E11.9). 03/12/20   [name], MD  gabapentin (NEURONTIN) 100 MG capsule TAKE 1 CAPSULE(100 MG) BY MOUTH TWICE DAILY Patient taking differently: Take 100 mg by mouth 2 (two) times daily. 08/03/20   [name], MD   glimepiride (AMARYL) 2 MG tablet TAKE 1 TABLET(2 MG) BY MOUTH DAILY BEFORE BREAKFAST Patient taking differently: Take 2 mg by mouth daily with breakfast. 12/16/20   [name], MD  hydrocortisone (ANUSOL-HC) 2.5 % rectal cream Place 1 application rectally 2 (two) times daily. Patient taking differently: Place 1 application rectally 2 (two) times daily as needed for hemorrhoids. 12/04/20   [name], MD  lactulose (CHRONULAC) 10 GM/15ML solution Take 30 mLs (20 g total) by mouth 3 (three) times daily. 01/17/21   Dessa Phi, DO  metFORMIN (GLUCOPHAGE) 850 MG tablet TAKE 1 TABLET(850 MG) BY MOUTH TWICE DAILY WITH A MEAL 01/08/21   [name], MD  nitroGLYCERIN (NITROSTAT) 0.4 MG SL tablet Place 1 tablet (0.4 mg total) under the tongue every 5 (five) minutes as needed for chest pain. 06/08/16   Nahser, Wonda Cheng, MD  pantoprazole (PROTONIX) 40 MG tablet Take 1 tablet (40 mg total) by mouth 2 (two) times daily. Patient not taking: No sig reported 09/12/20   Pokhrel, Corrie Mckusick, MD  polyethylene glycol powder (GLYCOLAX/MIRALAX) 17 GM/SCOOP powder Take 8.5 g by mouth in the morning, at noon, and at bedtime.    [provider]  Psyllium (METAMUCIL PO) Take 15 mLs by mouth 3 (three) times daily with meals. Mix with 4 oz of juice or water    [provider]  spironolactone (ALDACTONE) 25 MG tablet Take 1 tablet (25 mg total) by mouth daily. 08/18/20   Danford, Suann Larry, MD  sucralfate (CARAFATE) 1 g tablet Take 1 tablet (1 g total) by mouth 4 (four) times daily. Patient taking differently: Take 2 g by mouth in the morning and at bedtime. 11/23/20 12/23/20  Elodia Florence., MD  torsemide (DEMADEX) 20 MG tablet Take 2 tablets (40 mg total) by mouth 2 (two) times daily. 08/18/20   Danford, Suann Larry, MD  umeclidinium-vilanterol (ANORO ELLIPTA) 62.5-25 MCG/INH AEPB Inhale 1 puff into the lungs daily. 03/12/20   [name], MD    Allergies     Fluzone quadrivalent [influenza vac split quad]  Review of Systems   Review of  Systems  Unable to perform ROS: Mental status change    Physical Exam Updated Vital Signs BP (!) 111/41   Pulse 84   Temp 99.6 F (37.6 C) (Oral)   Resp 19   SpO2 98%   Physical Exam Vitals and nursing note reviewed.  Constitutional:      Appearance: He is well-developed.     Comments: Appears chronically ill, alert, answers basic questions appropriately but somewhat confused  HENT:     Head: Normocephalic and atraumatic.  Eyes:     Conjunctiva/sclera: Conjunctivae normal.  Cardiovascular:     Rate and Rhythm: Normal rate and regular rhythm.     Heart sounds: No murmur heard.   Pulmonary:     Effort: Pulmonary effort is normal. No respiratory distress.     Breath sounds: Normal breath sounds.  Abdominal:     Palpations: Abdomen is soft.     Tenderness: There is no abdominal tenderness.     Comments: Some distention but no tenderness  Genitourinary:    Comments: Hemoccult positive Musculoskeletal:        General: No deformity or signs of injury.     Cervical back: Neck supple.  Skin:    General: Skin is warm and dry.  Neurological:     Mental Status: He is alert.     Comments: Alert, oriented to person and place but does have some general confusion     ED Results / Procedures / Treatments   Labs (all labs ordered are listed, but only abnormal results are displayed) Labs Reviewed  LACTIC ACID, PLASMA - Abnormal; Notable for the following components:      Result Value   Lactic Acid, Venous 2.6 (*)    All other components within normal limits  LACTIC ACID, PLASMA - Abnormal; Notable for the following components:   Lactic Acid, Venous 2.5 (*)    All other components within normal limits  COMPREHENSIVE METABOLIC PANEL - Abnormal; Notable for the following components:   Glucose, Bld 159 (*)    Alkaline Phosphatase 170 (*)    Total Bilirubin 1.9 (*)    All other components within  normal limits  CBC WITH DIFFERENTIAL/PLATELET - Abnormal; Notable for the following components:   RBC 3.14 (*)    Hemoglobin 7.4 (*)    HCT 25.2 (*)    MCH 23.6 (*)    MCHC 29.4 (*)    RDW 22.5 (*)    Platelets 108 (*)    Lymphs Abs 0.2 (*)    All other components within normal limits  PROTIME-INR - Abnormal; Notable for the following components:   Prothrombin Time 16.6 (*)    INR 1.3 (*)    All other components within normal limits  URINALYSIS, ROUTINE W REFLEX MICROSCOPIC - Abnormal; Notable for the following components:   Protein, ur 30 (*)    All other components within normal limits  AMMONIA - Abnormal; Notable for the following components:   Ammonia 69 (*)    All other components within normal limits  BRAIN NATRIURETIC PEPTIDE - Abnormal; Notable for the following components:   B Natriuretic Peptide 583.0 (*)    All other components within normal limits  I-STAT VENOUS BLOOD GAS, ED - Abnormal; Notable for the following components:   pH, Ven 7.452 (*)    pCO2, Ven 37.7 (*)    HCT 22.0 (*)    Hemoglobin 7.5 (*)    All other components within normal limits  POC OCCULT BLOOD, ED - Abnormal;  Notable for the following components:   Fecal Occult Bld POSITIVE (*)    All other components within normal limits  RESP PANEL BY RT-PCR (FLU A&B, COVID) [id number]  URINE CULTURE  CULTURE, BLOOD (ROUTINE X 2)  CULTURE, BLOOD (ROUTINE X 2)  APTT  TROPONIN I (HIGH SENSITIVITY)  TROPONIN I (HIGH SENSITIVITY)    EKG EKG Interpretation  Date/Time:  Friday Jan 22 2021 15:58:42 EDT Ventricular Rate:  86 PR Interval:  246 QRS Duration: 111 QT Interval:  414 QTC Calculation: 496 R Axis:   95 Text Interpretation: Sinus rhythm Prolonged PR interval Right axis deviation Borderline prolonged QT interval Confirmed by Madalyn Rob [phone]) on 01/22/2021 3:59:39 PM   Radiology CT Head Wo Contrast  Result Date: 01/22/2021 CLINICAL DATA:  75 year old male with delirium. EXAM: CT HEAD  WITHOUT CONTRAST TECHNIQUE: Contiguous axial images were obtained from the base of the skull through the vertex without intravenous contrast. COMPARISON:  Head CT dated 05/25/2018. FINDINGS: Brain: The ventricles and sulci appropriate size for patient's age. The gray-white matter discrimination is preserved. There is no acute intracranial hemorrhage. No mass effect or midline shift. No extra-axial fluid collection. Vascular: No hyperdense vessel or unexpected calcification. Skull: Normal. Negative for fracture or focal lesion. Sinuses/Orbits: There is diffuse mucoperiosteal thickening of paranasal sinuses. No air-fluid level. The mastoid air cells are clear. Other: None IMPRESSION: 1. No acute intracranial pathology. 2. Paranasal sinus disease. Electronically Signed   By: Anner Crete M.D.   On: 01/22/2021 18:33   DG Chest Port 1 View  Result Date: 01/22/2021 CLINICAL DATA:  75 year old male with concern for sepsis. EXAM: PORTABLE CHEST 1 VIEW COMPARISON:  Chest radiograph dated 11/20/2020. FINDINGS: There is cardiomegaly with vascular congestion. No focal consolidation, pleural effusion, or pneumothorax. Median sternotomy wires and CABG vascular clips. No acute osseous pathology. IMPRESSION: Cardiomegaly with vascular congestion.  No focal consolidation. Electronically Signed   By: Anner Crete M.D.   On: 01/22/2021 16:29    Procedures .Critical Care Performed by: Lucrezia Starch, MD Authorized by: Lucrezia Starch, MD   Critical care provider statement:    Critical care time (minutes):  45   Critical care was necessary to treat or prevent imminent or life-threatening deterioration of the following conditions:  Hepatic failure   Critical care was time spent personally by me on the following activities:  Discussions with consultants, evaluation of patient's response to treatment, examination of patient, ordering and performing treatments and interventions, ordering and review of  laboratory studies, ordering and review of radiographic studies, pulse oximetry, re-evaluation of patient's condition, obtaining history from patient or surrogate and review of old charts     Medications Ordered in ED Medications  ceFEPIme (MAXIPIME) 2 g in sodium chloride 0.9 % 100 mL IVPB (0 g Intravenous Stopped 01/22/21 1811)  vancomycin (VANCOREADY) IVPB 1500 mg/300 mL (has no administration in time range)  lactated ringers bolus 500 mL (0 mLs Intravenous Stopped 01/22/21 1750)  metroNIDAZOLE (FLAGYL) IVPB 500 mg (0 mg Intravenous Stopped 01/22/21 1750)  vancomycin (VANCOREADY) IVPB 1750 mg/350 mL (0 mg Intravenous Stopped 01/22/21 1954)  lactulose (CHRONULAC) 10 GM/15ML solution 20 g (20 g Oral Given 01/22/21 2029)    ED Course  I have reviewed the triage vital signs and the nursing notes.  Pertinent labs & imaging results that were available during my care of the patient were reviewed by me and considered in my medical decision making (see chart for details).  Clinical Course as of 01/22/21 2115  Fri Jan 22, 2021  1617 Completed initial assessment - AMS, borderline fever, concern for possibility of sepsis, unknown source, will start sepsis orderset, will hold large fluid bolus given HF, fluid overload and first lactate pending and hemodynamically stable at present [RD]    Clinical Course User Index [RD] Lucrezia Starch, MD   MDM Rules/Calculators/A&P                         75 year old male presents to ER with concern for generalized lethargy and confusion.  Hx of hepatic cirrhosis, portal hypertension, esophageal varices, recurrent anemia, hepatocellular carcinoma status posttreatment in November 2021, CAD s/p CABG, diastolic CHF, EF 36%, diabetes mellitus type 2, hypertension, hyperlipidemia, Recent admission for recurrent GI bleed, symptomatic anemia, pancytopenia, decompensated cirrhosis, status post TIPS procedure, embolization of gastric varices 5/18.  On exam patient was  somewhat lethargic and mildly confused.  His vital signs were stable except noted borderline temperature.  Initial primary concern for either sepsis or hepatic encephalopathy.  Provided broad-spectrum antibiotics.  Given concern for decompensated cirrhosis and diastolic heart failure, very hesitant to fluid overload and only gave small fluid bolus.  Blood work notable for no leukocytosis, hemoglobin at baseline.  Did have heme positive stool.  Ammonia was elevated but not worse than prior.  Discussed the case with Dr. Cristina Gong.  He suspects most likely this is hepatic encephalopathy related to recent TIPS procedure and medication noncompliance.  Patient son reports patient has not taken any lactulose since discharge.  He recommends trial of lactulose to see if this helps his encephalopathy.  Consulted medicine to admit as primary.  Dr. Olevia Bowens accepting.   Final Clinical Impression(s) / ED Diagnoses Final diagnoses:  Hepatic encephalopathy ([place])  Altered mental status, unspecified altered mental status type  Gastrointestinal hemorrhage, unspecified gastrointestinal hemorrhage type  Anemia, unspecified type    Rx / DC Orders ED Discharge Orders    None       Lucrezia Starch, MD 01/22/21 2115

## 2021-01-22 NOTE — ED Triage Notes (Signed)
Pt arrives via EMS from home with complaints of AMS. Symptoms began around 3 days. Increased confusion, nausea, chills, weakness, vomiting. Alert to self only as of today. Baseline A/O X4, ambulatory.   20g LAC-- given 298mL NS with EMS  100.7 T 152/70 HR 86 96% RA CBG 206

## 2021-01-22 NOTE — Sepsis Progress Note (Signed)
Code Sepsis protocol being monitored by eLink. 

## 2021-01-22 NOTE — Sepsis Progress Note (Signed)
Spoke with Dr. Roslynn Amble via secure chat regarding repeat lactic.  Per MD, suspicion that symptoms & elevated lactic level are related to other medical issues, not sepsis.  No need to order repeat lactic at this time, as it will likely remain elevated.

## 2021-01-23 ENCOUNTER — Other Ambulatory Visit: Payer: Self-pay

## 2021-01-23 DIAGNOSIS — M109 Gout, unspecified: Secondary | ICD-10-CM | POA: Diagnosis present

## 2021-01-23 DIAGNOSIS — N1831 Chronic kidney disease, stage 3a: Secondary | ICD-10-CM | POA: Diagnosis present

## 2021-01-23 DIAGNOSIS — E669 Obesity, unspecified: Secondary | ICD-10-CM | POA: Diagnosis not present

## 2021-01-23 DIAGNOSIS — K3189 Other diseases of stomach and duodenum: Secondary | ICD-10-CM | POA: Diagnosis present

## 2021-01-23 DIAGNOSIS — D509 Iron deficiency anemia, unspecified: Secondary | ICD-10-CM | POA: Diagnosis present

## 2021-01-23 DIAGNOSIS — I8510 Secondary esophageal varices without bleeding: Secondary | ICD-10-CM | POA: Diagnosis not present

## 2021-01-23 DIAGNOSIS — K219 Gastro-esophageal reflux disease without esophagitis: Secondary | ICD-10-CM | POA: Diagnosis present

## 2021-01-23 DIAGNOSIS — N200 Calculus of kidney: Secondary | ICD-10-CM | POA: Diagnosis not present

## 2021-01-23 DIAGNOSIS — E1122 Type 2 diabetes mellitus with diabetic chronic kidney disease: Secondary | ICD-10-CM | POA: Diagnosis present

## 2021-01-23 DIAGNOSIS — R4182 Altered mental status, unspecified: Secondary | ICD-10-CM | POA: Diagnosis not present

## 2021-01-23 DIAGNOSIS — I8500 Esophageal varices without bleeding: Secondary | ICD-10-CM | POA: Diagnosis not present

## 2021-01-23 DIAGNOSIS — K7460 Unspecified cirrhosis of liver: Secondary | ICD-10-CM | POA: Diagnosis not present

## 2021-01-23 DIAGNOSIS — E872 Acidosis: Secondary | ICD-10-CM | POA: Diagnosis not present

## 2021-01-23 DIAGNOSIS — K7290 Hepatic failure, unspecified without coma: Secondary | ICD-10-CM | POA: Diagnosis not present

## 2021-01-23 DIAGNOSIS — G4733 Obstructive sleep apnea (adult) (pediatric): Secondary | ICD-10-CM | POA: Diagnosis present

## 2021-01-23 DIAGNOSIS — K766 Portal hypertension: Secondary | ICD-10-CM | POA: Diagnosis not present

## 2021-01-23 DIAGNOSIS — R69 Illness, unspecified: Secondary | ICD-10-CM | POA: Diagnosis not present

## 2021-01-23 DIAGNOSIS — K7031 Alcoholic cirrhosis of liver with ascites: Secondary | ICD-10-CM | POA: Diagnosis not present

## 2021-01-23 DIAGNOSIS — D500 Iron deficiency anemia secondary to blood loss (chronic): Secondary | ICD-10-CM | POA: Diagnosis present

## 2021-01-23 DIAGNOSIS — R509 Fever, unspecified: Secondary | ICD-10-CM | POA: Diagnosis not present

## 2021-01-23 DIAGNOSIS — R945 Abnormal results of liver function studies: Secondary | ICD-10-CM | POA: Diagnosis not present

## 2021-01-23 DIAGNOSIS — G9340 Encephalopathy, unspecified: Secondary | ICD-10-CM | POA: Diagnosis not present

## 2021-01-23 DIAGNOSIS — R188 Other ascites: Secondary | ICD-10-CM | POA: Diagnosis not present

## 2021-01-23 DIAGNOSIS — I272 Pulmonary hypertension, unspecified: Secondary | ICD-10-CM | POA: Diagnosis not present

## 2021-01-23 DIAGNOSIS — Z6834 Body mass index (BMI) 34.0-34.9, adult: Secondary | ICD-10-CM | POA: Diagnosis not present

## 2021-01-23 DIAGNOSIS — E785 Hyperlipidemia, unspecified: Secondary | ICD-10-CM | POA: Diagnosis present

## 2021-01-23 DIAGNOSIS — K7040 Alcoholic hepatic failure without coma: Secondary | ICD-10-CM | POA: Diagnosis present

## 2021-01-23 DIAGNOSIS — Z20822 Contact with and (suspected) exposure to covid-19: Secondary | ICD-10-CM | POA: Diagnosis not present

## 2021-01-23 DIAGNOSIS — K6389 Other specified diseases of intestine: Secondary | ICD-10-CM | POA: Diagnosis not present

## 2021-01-23 DIAGNOSIS — I130 Hypertensive heart and chronic kidney disease with heart failure and stage 1 through stage 4 chronic kidney disease, or unspecified chronic kidney disease: Secondary | ICD-10-CM | POA: Diagnosis not present

## 2021-01-23 DIAGNOSIS — E876 Hypokalemia: Secondary | ICD-10-CM | POA: Diagnosis present

## 2021-01-23 DIAGNOSIS — I517 Cardiomegaly: Secondary | ICD-10-CM | POA: Diagnosis not present

## 2021-01-23 DIAGNOSIS — I5033 Acute on chronic diastolic (congestive) heart failure: Secondary | ICD-10-CM | POA: Diagnosis not present

## 2021-01-23 DIAGNOSIS — I864 Gastric varices: Secondary | ICD-10-CM | POA: Diagnosis not present

## 2021-01-23 DIAGNOSIS — D649 Anemia, unspecified: Secondary | ICD-10-CM | POA: Diagnosis not present

## 2021-01-23 DIAGNOSIS — I2510 Atherosclerotic heart disease of native coronary artery without angina pectoris: Secondary | ICD-10-CM | POA: Diagnosis present

## 2021-01-23 LAB — BLOOD CULTURE ID PANEL (REFLEXED) - BCID2

## 2021-01-23 LAB — BLOOD GAS, ARTERIAL
Acid-base deficit: 1.3 mmol/L (ref 0.0–2.0)
Bicarbonate: 21.4 mmol/L (ref 20.0–28.0)
Drawn by: 336832
FIO2: 21
O2 Saturation: 97.4 %
Patient temperature: 37
pCO2 arterial: 27.4 mmHg — ABNORMAL LOW (ref 32.0–48.0)
pH, Arterial: 7.505 — ABNORMAL HIGH (ref 7.350–7.450)
pO2, Arterial: 83 mmHg (ref 83.0–108.0)

## 2021-01-23 LAB — GLUCOSE, CAPILLARY
Glucose-Capillary: 151 mg/dL — ABNORMAL HIGH (ref 70–99)
Glucose-Capillary: 143 mg/dL — ABNORMAL HIGH (ref 70–99)
Glucose-Capillary: 136 mg/dL — ABNORMAL HIGH (ref 70–99)
Glucose-Capillary: 134 mg/dL — ABNORMAL HIGH (ref 70–99)

## 2021-01-23 LAB — HEMOGLOBIN AND HEMATOCRIT, BLOOD
HCT: 26.6 % — ABNORMAL LOW (ref 39.0–52.0)
HCT: 25.3 % — ABNORMAL LOW (ref 39.0–52.0)
HCT: 25.8 % — ABNORMAL LOW (ref 39.0–52.0)
Hemoglobin: 7.8 g/dL — ABNORMAL LOW (ref 13.0–17.0)
Hemoglobin: 7.5 g/dL — ABNORMAL LOW (ref 13.0–17.0)
Hemoglobin: 7.6 g/dL — ABNORMAL LOW (ref 13.0–17.0)

## 2021-01-23 LAB — COMPREHENSIVE METABOLIC PANEL
ALT: 20 U/L (ref 0–44)
AST: 37 U/L (ref 15–41)
Albumin: 3.1 g/dL — ABNORMAL LOW (ref 3.5–5.0)
Alkaline Phosphatase: 132 U/L — ABNORMAL HIGH (ref 38–126)
Anion gap: 11 (ref 5–15)
BUN: 16 mg/dL (ref 8–23)
CO2: 20 mmol/L — ABNORMAL LOW (ref 22–32)
Calcium: 8.7 mg/dL — ABNORMAL LOW (ref 8.9–10.3)
Chloride: 112 mmol/L — ABNORMAL HIGH (ref 98–111)
Creatinine, Ser: 1.2 mg/dL (ref 0.61–1.24)
GFR, Estimated: >60 mL/min (ref >60)
Glucose, Bld: 180 mg/dL — ABNORMAL HIGH (ref 70–99)
Potassium: 3.6 mmol/L (ref 3.5–5.1)
Sodium: 143 mmol/L (ref 135–145)
Total Bilirubin: 1.8 mg/dL — ABNORMAL HIGH (ref 0.3–1.2)
Total Protein: 6.1 g/dL — ABNORMAL LOW (ref 6.5–8.1)

## 2021-01-23 LAB — TYPE AND SCREEN
ABO/RH(D): O POS
Antibody Screen: NEGATIVE

## 2021-01-23 LAB — CBC
HCT: 26 % — ABNORMAL LOW (ref 39.0–52.0)
Hemoglobin: 7.4 g/dL — ABNORMAL LOW (ref 13.0–17.0)
MCH: 23.5 pg — ABNORMAL LOW (ref 26.0–34.0)
MCHC: 28.5 g/dL — ABNORMAL LOW (ref 30.0–36.0)
MCV: 82.5 fL (ref 80.0–100.0)
Platelets: 121 10*3/uL — ABNORMAL LOW (ref 150–400)
RBC: 3.15 MIL/uL — ABNORMAL LOW (ref 4.22–5.81)
RDW: 23 % — ABNORMAL HIGH (ref 11.5–15.5)
WBC: 12.3 10*3/uL — ABNORMAL HIGH (ref 4.0–10.5)
nRBC: 0 % (ref 0.0–0.2)

## 2021-01-23 LAB — LACTIC ACID, PLASMA: Lactic Acid, Venous: 3.2 mmol/L (ref 0.5–1.9)

## 2021-01-23 LAB — PROCALCITONIN: Procalcitonin: 0.16 ng/mL

## 2021-01-23 LAB — AMMONIA: Ammonia: 153 umol/L — ABNORMAL HIGH (ref 9–35)

## 2021-01-23 MED ORDER — SUCRALFATE 1 G PO TABS: 1.0000 g | ORAL_TABLET | Freq: Three times a day (TID) | ORAL | Status: DC

## 2021-01-23 MED ORDER — LACTULOSE ENEMA
300.0000 mL | Freq: Once | ORAL | Status: DC | PRN
  Filled 2021-01-23: qty 300

## 2021-01-23 MED ORDER — GABAPENTIN 100 MG PO CAPS: 100.0000 mg | ORAL_CAPSULE | Freq: Two times a day (BID) | ORAL | Status: DC

## 2021-01-23 MED ORDER — UMECLIDINIUM-VILANTEROL 62.5-25 MCG/INH IN AEPB
1.0000 | INHALATION_SPRAY | Freq: Every day | RESPIRATORY_TRACT | Status: DC
  Administered 2021-01-25 – 2021-01-30 (×5): 1 via RESPIRATORY_TRACT
  Filled 2021-01-23: qty 14

## 2021-01-23 MED ORDER — TORSEMIDE 20 MG PO TABS: 40.0000 mg | ORAL_TABLET | Freq: Two times a day (BID) | ORAL | Status: DC

## 2021-01-23 MED ORDER — METFORMIN HCL 850 MG PO TABS: 850.0000 mg | ORAL_TABLET | Freq: Two times a day (BID) | ORAL | Status: DC

## 2021-01-23 MED ORDER — ALBUTEROL SULFATE HFA 108 (90 BASE) MCG/ACT IN AERS
2.0000 | INHALATION_SPRAY | Freq: Four times a day (QID) | RESPIRATORY_TRACT | Status: DC | PRN
  Filled 2021-01-23: qty 6.7

## 2021-01-23 MED ORDER — INSULIN ASPART 100 UNIT/ML IJ SOLN: 0.0000 [IU] | Freq: Every day | INTRAMUSCULAR | Status: DC

## 2021-01-23 MED ORDER — PANTOPRAZOLE SODIUM 40 MG IV SOLR
40.0000 mg | Freq: Two times a day (BID) | INTRAVENOUS | Status: DC
  Administered 2021-01-23 – 2021-01-25 (×5): 40 mg via INTRAVENOUS
  Filled 2021-01-23 (×5): qty 40

## 2021-01-23 MED ORDER — PANTOPRAZOLE SODIUM 40 MG PO TBEC: 40.0000 mg | DELAYED_RELEASE_TABLET | Freq: Two times a day (BID) | ORAL | Status: DC

## 2021-01-23 MED ORDER — SODIUM CHLORIDE 0.9 % IV SOLN: INTRAVENOUS | Status: DC | PRN

## 2021-01-23 MED ORDER — LACTULOSE ENEMA
300.0000 mL | Freq: Three times a day (TID) | ORAL | Status: DC
  Administered 2021-01-23 – 2021-01-25 (×5): 300 mL via RECTAL
  Filled 2021-01-23 (×6): qty 300

## 2021-01-23 MED ORDER — INSULIN ASPART 100 UNIT/ML IJ SOLN
0.0000 [IU] | Freq: Three times a day (TID) | INTRAMUSCULAR | Status: DC
  Administered 2021-01-23: 2 [IU] via SUBCUTANEOUS
  Administered 2021-01-23 – 2021-01-24 (×5): 1 [IU] via SUBCUTANEOUS
  Administered 2021-01-25 (×2): 2 [IU] via SUBCUTANEOUS
  Administered 2021-01-26 – 2021-01-27 (×4): 1 [IU] via SUBCUTANEOUS
  Administered 2021-01-27: 2 [IU] via SUBCUTANEOUS
  Administered 2021-01-28 (×2): 1 [IU] via SUBCUTANEOUS
  Administered 2021-01-29 (×3): 2 [IU] via SUBCUTANEOUS
  Administered 2021-01-30: 1 [IU] via SUBCUTANEOUS

## 2021-01-23 MED ORDER — SPIRONOLACTONE 25 MG PO TABS: 25.0000 mg | ORAL_TABLET | Freq: Every day | ORAL | Status: DC

## 2021-01-23 MED ORDER — LACTULOSE 10 GM/15ML PO SOLN: 20.0000 g | Freq: Three times a day (TID) | ORAL | Status: DC

## 2021-01-23 MED ORDER — FUROSEMIDE 10 MG/ML IJ SOLN
40.0000 mg | Freq: Two times a day (BID) | INTRAMUSCULAR | Status: DC
  Administered 2021-01-23 (×2): 40 mg via INTRAVENOUS
  Filled 2021-01-23 (×2): qty 4

## 2021-01-23 MED ORDER — GLIMEPIRIDE 2 MG PO TABS: 2.0000 mg | ORAL_TABLET | Freq: Every day | ORAL | Status: DC

## 2021-01-23 MED ORDER — NITROGLYCERIN 0.4 MG SL SUBL: 0.4000 mg | SUBLINGUAL_TABLET | SUBLINGUAL | Status: DC | PRN

## 2021-01-23 MED ORDER — POLYETHYLENE GLYCOL 3350 17 G PO PACK
8.5000 g | PACK | Freq: Three times a day (TID) | ORAL | Status: DC
  Administered 2021-01-25: 8.5 g via ORAL
  Filled 2021-01-23: qty 1

## 2021-01-23 NOTE — ED Notes (Signed)
This RN attempted to call report to 5W.

## 2021-01-23 NOTE — Progress Notes (Signed)
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  At baseline patient is typically alert and oriented x4 and ambulatory.  Symptoms onset roughly 3 days ago with associated confusion, nausea, chills, weakness and vomiting.  Apparently patient was not taking his lactulose at home.  Patient recently discharged 5 days prior after an 11-day hospitalization due to symptomatic anemia in the setting of recurrent GI bleeding requiring 5 unit PRBC and TIPS procedure with embolization of gastric varices by IR on 01/13/2021.  In the ED, temperature 99.6 F, HR 86, RR 20, BP 141/57, SPO2 99% on room air.  Sodium 142, potassium 3.5, chloride 109, glucose 159, CO2 25, BUN 15, creatinine 1.12.  Alkaline phosphatase 170, AST 28, ALT 21, total bilirubin 1.9.  High sensitive troponin 13>12, within normal limits.  WBC 8.4, hemoglobin 7.4, platelets 108.  Lactic acid 2.6.  Ammonia level 69.  Chest x-ray with cardiomegaly and vascular congestion, no focal consolidation.  EKG with NSR, borderline prolonged QTC 496, first-degree AV block, no dynamic changes.  CT head without contrast with no acute intracranial pathology.  FOBT positive.  EDP discussed case with on-call GI, Dr. Cristina Gong who recommended treatment of hepatic encephalopathy.  TRH consulted for further evaluation and management.   Assessment & Plan:   Principal Problem:   Hepatic encephalopathy ([place]) Active Problems:   Type 2 diabetes with complication (HCC)   GERD  without esophagitis   Hyperlipidemia with target LDL less than 70   CKD (chronic kidney disease), stage III (HCC)   Alcoholic cirrhosis of liver with ascites (HCC)   Hepatocellular carcinoma (HCC)   Acute metabolic encephalopathy Hepatic encephalopathy Patient presenting to the ED via EMS after 3-day history of progressive confusion, weakness.  Recently discharged following symptomatic anemia with subsequent TIPS procedure for gastric varices.  CT head without contrast unrevealing.  Urinalysis unremarkable.  [name] PCR and flu A/B PCR negative.  Ammonia level elevated at 69.  Noncompliant with lactulose at home.  Etiology likely complicated by recent TIPS procedure. --Ammonia 69>153 --Received Lactulose enema this am --Lactulose 20 g p.o. 3 times daily; if unable to tolerate will continue lactulose enemas until mental status improves --If no significant improvement, will consider placement of NG tube to initiate oral lactulose/rifaximin --Continue to monitor ammonia level and mental status closely  Normocytic Anemia Recently hospitalized and status posttransfusion 5 unit PRBCs.  Hemoglobin 7.4 on admission (7.3 on 5/22 at previous discharge).  MCV 82.5.  FOBT positive. --Type and screen --Protonix 40mg  IV q12h --H/H every 6 hours --Transfuse for hemoglobin less than 7.0 --Monitor on telemetry  Hepatic cirrhosis Gastric/esophageal varices Portal gastropathy EGD 5/13 by Dr. Michail Sermon notable for small distal esophagus varices, erosions/erythema gastric antrum, gastric varices within the fundus, mild portal hypertensive gastropathy.  Patient underwent TIPS and embolization of gastric varices by IR on 5/18.  Acute on chronic diastolic congestive heart failure TTE with LVEF 81-27%, grade 3 diastolic dysfunction.  On torsemide and spironolactone outpatient.  BNP elevated 583 with chest x-ray findings of pulmonary edema on admission. --  Holding oral medications until mental status  improves --Furosemide 40 mg IV every 12 hours --Strict I's and O's and daily weights --Monitor renal function closely daily  Lactic acidosis Etiology unclear, but suspect from dehydration/hepatic encephalopathy.  No signs of focal infectious process, afebrile without leukocytosis, urinalysis unremarkable, chest x-ray without focal consolidation. --Started empirically on antibiotics by admitting physician with vancomycin/cefepime --Check procalcitonin, negative will discontinue antibiotics --Lactic acid level in a.m.  CKD stage IIIa Baseline creatinine 1.2.  Creatinine on admission 1.20, at baseline. --Avoid nephrotoxins, renal dose all medications --Monitor renal function closely daily  Type 2 diabetes mellitus On glimepiride and metformin outpatient. --Hold oral hypoglycemics while inpatient --SSI for coverage --CBGs before every meal/at bedtime  HLD: Holding Lipitor until mental status improves  Morbid obesity Body mass index is 34.66 kg/m.  Discussed with patient needs for aggressive lifestyle changes/weight loss as this complicates all facets of care.  Outpatient follow-up with PCP.     DVT prophylaxis: SCDs, chemical DVT prophylaxis contraindicated with positive FOBT   Code Status: Full Code Family Communication: Attempted to update patient's son, [name] via telephone, unsuccessful.  Disposition Plan:  Level of care: Progressive Status is: Observation  The patient will require care spanning > 2 midnights and should be moved to inpatient because: Altered mental status, Ongoing diagnostic testing needed not appropriate for outpatient work up, Unsafe d/c plan, IV treatments appropriate due to intensity of illness or inability to take PO and Inpatient level of care appropriate due to severity of illness  Dispo: The patient is from: Home              Anticipated d/c is to: To be determined              Patient currently is not medically stable to d/c.   Difficult to place  patient No    Consultants:   EDP discussed care with GI, Dr. Cristina Gong  Procedures:   None  Antimicrobials:   Vancomycin 5/27>>  Cefepime 5/27>>  Metronidazole 5/27 - 5/27   Subjective: Patient seen and examined bedside, continues in ED holding area.  Patient sitting in large volume liquid stool following lactulose enema.  No family present at bedside to contact patient's son [name] via telephone this morning.  Patient is somnolent and unable to verbalize this AM.  Unable to obtain any further ROS from patient this morning due to his mental status.  Ammonia level increased from 65 to 153 this morning.  Objective: Vitals:   01/23/21 0645 01/23/21 0730 01/23/21 0924 01/23/21 0955  BP: 128/65 (!) 145/81 (!) 144/88 (!) 145/63  Pulse: 87 90 93 93  Resp: 17 (!) 23 18 19   Temp:   99.3 F (37.4 C) 99.8 F (37.7 C)  TempSrc:   Oral Oral  SpO2: 97% 100% 99% 96%  Weight:    103.4 kg  Height:    5\' 8"  (1.727 m)    Intake/Output Summary (Last 24 hours) at 01/23/2021 1036 Last data filed at 01/22/2021 1811 Gross per 24 hour  Intake 700 ml  Output --  Net 700 ml   Filed Weights   01/23/21 0955  Weight: 103.4 kg    Examination:  General exam: Chronically ill in appearance, obese, no acute distress, somnolent Respiratory system: Coarse breath sounds bilaterally, decreased at bases, mild crackles bilateral bases, normal respiratory effort without accessory muscle use, on room air Cardiovascular system: S1 & S2 heard, RRR. No JVD, murmurs, rubs, gallops or clicks. No pedal edema. Gastrointestinal system:  Abdomen is nondistended, soft and nontender. No organomegaly or masses felt. Normal bowel sounds heard. Central nervous system: Somnolent, incoherent, moves all extremities independently Extremities: Moves all extremities independently Skin: No rashes, lesions or ulcers Psychiatry: Unable to assess due to current mental status    Data Reviewed: I have personally reviewed  following labs and imaging studies  CBC: Recent Labs  Lab 01/17/21 0118 01/22/21 1558 01/22/21 1808 01/23/21 0500  WBC 3.0* 8.4  --  12.3*  NEUTROABS  --  7.7  --   --   HGB 7.3* 7.4* 7.5* 7.4*  HCT 25.2* 25.2* 22.0* 26.0*  MCV 80.5 80.3  --  82.5  PLT 73* 108*  --  259*   Basic Metabolic Panel: Recent Labs  Lab 01/17/21 0118 01/22/21 1558 01/22/21 1808 01/23/21 0500  NA 137 142 144 143  K 3.9 3.5 3.5 3.6  CL 103 109  --  112*  CO2 26 25  --  20*  GLUCOSE 121* 159*  --  180*  BUN 21 15  --  16  CREATININE 1.22 1.12  --  1.20  CALCIUM 8.6* 9.3  --  8.7*   GFR: Estimated Creatinine Clearance: 62.9 mL/min (by C-G formula based on SCr of 1.2 mg/dL). Liver Function Tests: Recent Labs  Lab 01/22/21 1558 01/23/21 0500  AST 28 37  ALT 21 20  ALKPHOS 170* 132*  BILITOT 1.9* 1.8*  PROT 6.8 6.1*  ALBUMIN 3.6 3.1*   No results for input(s): LIPASE, AMYLASE in the last 168 hours. Recent Labs  Lab 01/22/21 1610 01/23/21 0350  AMMONIA 69* 153*   Coagulation Profile: Recent Labs  Lab 01/22/21 1558  INR 1.3*   Cardiac Enzymes: No results for input(s): CKTOTAL, CKMB, CKMBINDEX, TROPONINI in the last 168 hours. BNP (last 3 results) No results for input(s): PROBNP in the last 8760 hours. HbA1C: No results for input(s): HGBA1C in the last 72 hours. CBG: Recent Labs  Lab 01/16/21 1537 01/16/21 2059 01/17/21 0603 01/17/21 1134 01/23/21 1000  GLUCAP 197* 136* 155* 177* 151*   Lipid Profile: No results for input(s): CHOL, HDL, LDLCALC, TRIG, CHOLHDL, LDLDIRECT in the last 72 hours. Thyroid Function Tests: No results for input(s): TSH, T4TOTAL, FREET4, T3FREE, THYROIDAB in the last 72 hours. Anemia Panel: No results for input(s): VITAMINB12, FOLATE, FERRITIN, TIBC, IRON, RETICCTPCT in the last 72 hours. Sepsis Labs: Recent Labs  Lab 01/22/21 1610 01/22/21 1758 01/23/21 0350  LATICACIDVEN 2.6* 2.5* 3.2*    Recent Results (from the past 240 hour(s))   Blood Culture (routine x 2)     Status: None (Preliminary result)   Collection Time: 01/22/21  2:08 PM   Specimen: BLOOD  Result Value Ref Range Status   Specimen Description BLOOD RIGHT ANTECUBITAL  Final   Special Requests   Final    BOTTLES DRAWN AEROBIC AND ANAEROBIC Blood Culture adequate volume   Culture   Final    NO GROWTH < 24 HOURS Performed at [facility], [place] [address] [place], [place] [address]    Report Status PENDING  Incomplete  Blood Culture (routine x 2)     Status: None (Preliminary result)   Collection Time: 01/22/21  4:03 PM   Specimen: BLOOD LEFT WRIST  Result Value Ref Range Status   Specimen Description BLOOD LEFT WRIST  Final   Special Requests   Final    BOTTLES DRAWN AEROBIC AND ANAEROBIC Blood Culture adequate volume   Culture   Final    NO  GROWTH < 24 HOURS Performed at [facility], [place] [address] [place], [place] [address]    Report Status PENDING  Incomplete  Resp Panel by RT-PCR (Flu A&B, Covid) Nasopharyngeal Swab     Status: None   Collection Time: 01/22/21  4:50 PM   Specimen: Nasopharyngeal Swab; Nasopharyngeal(NP) swabs in vial transport medium  Result Value Ref Range Status   SARS Coronavirus 2 by RT PCR NEGATIVE NEGATIVE Final    Comment: (NOTE) SARS-CoV-2 target nucleic acids are NOT DETECTED.  The SARS-CoV-2 RNA is generally detectable in upper respiratory specimens during the acute phase of infection. The lowest concentration of SARS-CoV-2 viral copies this assay can detect is 138 copies/mL. A negative result does not preclude SARS-Cov-2 infection and should not be used as the sole basis for treatment or other patient management decisions. A negative result may occur with  improper specimen collection/handling, submission of specimen other than nasopharyngeal swab, presence of viral mutation(s) within the areas targeted by this assay, and inadequate number of viral copies(<138 copies/mL). A negative result must be  combined with clinical observations, patient history, and epidemiological information. The expected result is Negative.  Fact Sheet for Patients:  EntrepreneurPulse.com.au  Fact Sheet for Healthcare Providers:  IncredibleEmployment.be  This test is no t yet approved or cleared by the [place] FDA and  has been authorized for detection and/or diagnosis of SARS-CoV-2 by FDA under an Emergency Use Authorization (EUA). This EUA will remain  in effect (meaning this test can be used) for the duration of the COVID-19 declaration under Section 564(b)(1) of the Act, 21 U.S.C.section 360bbb-3(b)(1), unless the authorization is terminated  or revoked sooner.       Influenza A by PCR NEGATIVE NEGATIVE Final   Influenza B by PCR NEGATIVE NEGATIVE Final    Comment: (NOTE) The Xpert Xpress SARS-CoV-2/FLU/RSV plus assay is intended as an aid in the diagnosis of influenza from Nasopharyngeal swab specimens and should not be used as a sole basis for treatment. Nasal washings and aspirates are unacceptable for Xpert Xpress SARS-CoV-2/FLU/RSV testing.  Fact Sheet for Patients: EntrepreneurPulse.com.au  Fact Sheet for Healthcare Providers: IncredibleEmployment.be  This test is not yet approved or cleared by the [place] FDA and has been authorized for detection and/or diagnosis of SARS-CoV-2 by FDA under an Emergency Use Authorization (EUA). This EUA will remain in effect (meaning this test can be used) for the duration of the COVID-19 declaration under Section 564(b)(1) of the Act, 21 U.S.C. section 360bbb-3(b)(1), unless the authorization is terminated or revoked.  Performed at [facility], [place] [address] [place], [place] [address]          Radiology Studies: CT Head Wo Contrast  Result Date: 01/22/2021 CLINICAL DATA:  75 year old male with delirium. EXAM: CT HEAD WITHOUT CONTRAST TECHNIQUE:  Contiguous axial images were obtained from the base of the skull through the vertex without intravenous contrast. COMPARISON:  Head CT dated 05/25/2018. FINDINGS: Brain: The ventricles and sulci appropriate size for patient's age. The gray-white matter discrimination is preserved. There is no acute intracranial hemorrhage. No mass effect or midline shift. No extra-axial fluid collection. Vascular: No hyperdense vessel or unexpected calcification. Skull: Normal. Negative for fracture or focal lesion. Sinuses/Orbits: There is diffuse mucoperiosteal thickening of paranasal sinuses. No air-fluid level. The mastoid air cells are clear. Other: None IMPRESSION: 1. No acute intracranial pathology. 2. Paranasal sinus disease. Electronically Signed   By: Anner Crete M.D.   On: 01/22/2021 18:33  DG Chest Port 1 View  Result Date: 01/22/2021 CLINICAL DATA:  75 year old male with concern for sepsis. EXAM: PORTABLE CHEST 1 VIEW COMPARISON:  Chest radiograph dated 11/20/2020. FINDINGS: There is cardiomegaly with vascular congestion. No focal consolidation, pleural effusion, or pneumothorax. Median sternotomy wires and CABG vascular clips. No acute osseous pathology. IMPRESSION: Cardiomegaly with vascular congestion.  No focal consolidation. Electronically Signed   By: Anner Crete M.D.   On: 01/22/2021 16:29        Scheduled Meds: . insulin aspart  0-5 Units Subcutaneous QHS  . insulin aspart  0-9 Units Subcutaneous TID WC  . lactulose  20 g Oral TID  . pantoprazole (PROTONIX) IV  40 mg Intravenous Q12H  . polyethylene glycol  8.5 g Oral TID  . spironolactone  25 mg Oral Daily  . sucralfate  1 g Oral TID WC & HS  . torsemide  40 mg Oral BID  . umeclidinium-vilanterol  1 puff Inhalation Daily   Continuous Infusions: . ceFEPime (MAXIPIME) IV Stopped (01/23/21 0556)  . vancomycin       LOS: 0 days    Time spent: 52 minutes spent on chart review, discussion with nursing staff, consultants,  updating family and interview/physical exam; more than 50% of that time was spent in counseling and/or coordination of care.    Tuvia Woodrick J [place] DO [facility] Available via Epic secure chat 7am-7pm After these hours, please refer to coverage provider listed on [email or web address] 01/23/2021, 10:36 AM

## 2021-01-23 NOTE — ED Notes (Signed)
Patient with large liquid bowel movement, complete bed, linen, and gown change, patient continues to be lethargic and altered.

## 2021-01-23 NOTE — Progress Notes (Signed)
   01/23/21 0955  Assess: MEWS Score  Temp 99.8 F (37.7 C)  BP (!) 145/63  Pulse Rate 93  ECG Heart Rate 94  Resp 19  Level of Consciousness Responds to Pain  SpO2 96 %  O2 Device Room Air  Assess: MEWS Score  MEWS Temp 0  MEWS Systolic 0  MEWS Pulse 0  MEWS RR 0  MEWS LOC 2  MEWS Score 2  MEWS Score Color Yellow  Assess: if the MEWS score is Yellow or Red  Were vital signs taken at a resting state? Yes  Focused Assessment No change from prior assessment  Early Detection of Sepsis Score *See Row Information* Low  MEWS guidelines implemented *See Row Information* Yes  Treat  Pain Scale Faces  Pain Score 0  Faces Pain Scale 0  Take Vital Signs  Increase Vital Sign Frequency  Yellow: Q 2hr X 2 then Q 4hr X 2, if remains yellow, continue Q 4hrs  Escalate  MEWS: Escalate Yellow: discuss with charge nurse/RN and consider discussing with provider and RRT  Notify: Charge Nurse/RN  Name of Charge Nurse/RN Notified Leiana RN  Date Charge Nurse/RN Notified 01/23/21  Time Charge Nurse/RN Notified 1855  Document  Patient Outcome Stabilized after interventions  Progress note created (see row info) Yes

## 2021-01-23 NOTE — Progress Notes (Signed)
PHARMACY - PHYSICIAN COMMUNICATION CRITICAL VALUE ALERT - BLOOD CULTURE IDENTIFICATION (BCID)  [name] is an 75 y.o. male who presented to [facility] on 01/22/2021 with a chief complaint of AMS.  Assessment:  Has hx of hepatic cirrhosis - had been off his lactulose at home - presented with confusion, nausea, chills, weakness, and vomiting. BCx were drawn resulting in 1/4 bottles with GPC in clusters - BCID finding staph epidermidis (mecA detected). Had received 2 days of vancomycin and cefepime for sepsis.   Name of physician (or Provider) Contacted: Dr Hal Hope  Current antibiotics: No antibiotics currently   Changes to prescribed antibiotics recommended:  Talked with provider - given 1/4 blood cultures likely contaminant - will not start any antibiotics at this time.   Results for orders placed or performed during the hospital encounter of 01/22/21  Blood Culture ID Panel (Reflexed) (Collected: 01/22/2021  2:08 PM)  Result Value Ref Range   Enterococcus faecalis NOT DETECTED NOT DETECTED   Enterococcus Faecium NOT DETECTED NOT DETECTED   Listeria monocytogenes NOT DETECTED NOT DETECTED   Staphylococcus species DETECTED (A) NOT DETECTED   Staphylococcus aureus (BCID) NOT DETECTED NOT DETECTED   Staphylococcus epidermidis DETECTED (A) NOT DETECTED   Staphylococcus lugdunensis NOT DETECTED NOT DETECTED   Streptococcus species NOT DETECTED NOT DETECTED   Streptococcus agalactiae NOT DETECTED NOT DETECTED   Streptococcus pneumoniae NOT DETECTED NOT DETECTED   Streptococcus pyogenes NOT DETECTED NOT DETECTED   A.calcoaceticus-baumannii NOT DETECTED NOT DETECTED   Bacteroides fragilis NOT DETECTED NOT DETECTED   Enterobacterales NOT DETECTED NOT DETECTED   Enterobacter cloacae complex NOT DETECTED NOT DETECTED   Escherichia coli NOT DETECTED NOT DETECTED   Klebsiella aerogenes NOT DETECTED NOT DETECTED   Klebsiella oxytoca NOT DETECTED NOT DETECTED   Klebsiella pneumoniae NOT  DETECTED NOT DETECTED   Proteus species NOT DETECTED NOT DETECTED   Salmonella species NOT DETECTED NOT DETECTED   Serratia marcescens NOT DETECTED NOT DETECTED   Haemophilus influenzae NOT DETECTED NOT DETECTED   Neisseria meningitidis NOT DETECTED NOT DETECTED   Pseudomonas aeruginosa NOT DETECTED NOT DETECTED   Stenotrophomonas maltophilia NOT DETECTED NOT DETECTED   Candida albicans NOT DETECTED NOT DETECTED   Candida auris NOT DETECTED NOT DETECTED   Candida glabrata NOT DETECTED NOT DETECTED   Candida krusei NOT DETECTED NOT DETECTED   Candida parapsilosis NOT DETECTED NOT DETECTED   Candida tropicalis NOT DETECTED NOT DETECTED   Cryptococcus neoformans/gattii NOT DETECTED NOT DETECTED   Methicillin resistance mecA/C DETECTED (A) NOT DETECTED   Antonietta Jewel, PharmD, BCCCP Clinical Pharmacist  Phone: [phone] 01/23/2021 8:25 PM  Please check AMION for all [facility] phone numbers After 10:00 PM, call [place] [phone]

## 2021-01-24 LAB — COMPREHENSIVE METABOLIC PANEL
ALT: 19 U/L (ref 0–44)
AST: 32 U/L (ref 15–41)
Albumin: 2.9 g/dL — ABNORMAL LOW (ref 3.5–5.0)
Alkaline Phosphatase: 113 U/L (ref 38–126)
Anion gap: 11 (ref 5–15)
BUN: 21 mg/dL (ref 8–23)
CO2: 20 mmol/L — ABNORMAL LOW (ref 22–32)
Calcium: 8.4 mg/dL — ABNORMAL LOW (ref 8.9–10.3)
Chloride: 115 mmol/L — ABNORMAL HIGH (ref 98–111)
Creatinine, Ser: 1.36 mg/dL — ABNORMAL HIGH (ref 0.61–1.24)
GFR, Estimated: 55 mL/min — ABNORMAL LOW (ref >60)
Glucose, Bld: 160 mg/dL — ABNORMAL HIGH (ref 70–99)
Potassium: 2.8 mmol/L — ABNORMAL LOW (ref 3.5–5.1)
Sodium: 146 mmol/L — ABNORMAL HIGH (ref 135–145)
Total Bilirubin: 2.1 mg/dL — ABNORMAL HIGH (ref 0.3–1.2)
Total Protein: 6 g/dL — ABNORMAL LOW (ref 6.5–8.1)

## 2021-01-24 LAB — GLUCOSE, CAPILLARY
Glucose-Capillary: 128 mg/dL — ABNORMAL HIGH (ref 70–99)
Glucose-Capillary: 121 mg/dL — ABNORMAL HIGH (ref 70–99)
Glucose-Capillary: 128 mg/dL — ABNORMAL HIGH (ref 70–99)
Glucose-Capillary: 117 mg/dL — ABNORMAL HIGH (ref 70–99)

## 2021-01-24 LAB — CBC
HCT: 27.8 % — ABNORMAL LOW (ref 39.0–52.0)
Hemoglobin: 8 g/dL — ABNORMAL LOW (ref 13.0–17.0)
MCH: 23.1 pg — ABNORMAL LOW (ref 26.0–34.0)
MCHC: 28.8 g/dL — ABNORMAL LOW (ref 30.0–36.0)
MCV: 80.3 fL (ref 80.0–100.0)
Platelets: 135 10*3/uL — ABNORMAL LOW (ref 150–400)
RBC: 3.46 MIL/uL — ABNORMAL LOW (ref 4.22–5.81)
RDW: 23.1 % — ABNORMAL HIGH (ref 11.5–15.5)
WBC: 9.3 10*3/uL (ref 4.0–10.5)
nRBC: 0 % (ref 0.0–0.2)

## 2021-01-24 LAB — MRSA PCR SCREENING: MRSA by PCR: NEGATIVE

## 2021-01-24 LAB — MAGNESIUM: Magnesium: 1.9 mg/dL (ref 1.7–2.4)

## 2021-01-24 LAB — POTASSIUM
Potassium: 2.8 mmol/L — ABNORMAL LOW (ref 3.5–5.1)
Potassium: 3.4 mmol/L — ABNORMAL LOW (ref 3.5–5.1)

## 2021-01-24 LAB — TSH: TSH: 0.953 u[IU]/mL (ref 0.350–4.500)

## 2021-01-24 LAB — AMMONIA: Ammonia: 83 umol/L — ABNORMAL HIGH (ref 9–35)

## 2021-01-24 MED ORDER — POTASSIUM CHLORIDE 2 MEQ/ML IV SOLN
INTRAVENOUS | Status: DC
  Filled 2021-01-24 (×2): qty 1000

## 2021-01-24 MED ORDER — POTASSIUM CHLORIDE 10 MEQ/100ML IV SOLN
10.0000 meq | INTRAVENOUS | Status: AC
  Administered 2021-01-24 (×6): 10 meq via INTRAVENOUS
  Filled 2021-01-24 (×6): qty 100

## 2021-01-24 MED ORDER — POTASSIUM CHLORIDE 10 MEQ/100ML IV SOLN
10.0000 meq | INTRAVENOUS | Status: AC
  Administered 2021-01-24 (×2): 10 meq via INTRAVENOUS
  Filled 2021-01-24 (×2): qty 100

## 2021-01-24 MED ORDER — POTASSIUM CHLORIDE 10 MEQ/100ML IV SOLN
10.0000 meq | INTRAVENOUS | Status: AC
  Administered 2021-01-24 (×4): 10 meq via INTRAVENOUS
  Filled 2021-01-24 (×4): qty 100

## 2021-01-24 NOTE — Progress Notes (Signed)
   01/24/21 0247  Assess: MEWS Score  Temp 99.6 F (37.6 C)  BP (!) 151/60  Pulse Rate (!) 102  ECG Heart Rate (!) 102  Resp (!) 23  SpO2 99 %  O2 Device Room Air  Patient Activity (if Appropriate) In bed  Assess: MEWS Score  MEWS Temp 0  MEWS Systolic 0  MEWS Pulse 1  MEWS RR 1  MEWS LOC 2  MEWS Score 4  MEWS Score Color Red  Assess: if the MEWS score is Yellow or Red  Were vital signs taken at a resting state? Yes  Focused Assessment No change from prior assessment  Early Detection of Sepsis Score *See Row Information* High  MEWS guidelines implemented *See Row Information* Yes  Treat  MEWS Interventions Escalated (See documentation below);Administered scheduled meds/treatments  Pain Scale PAINAD  Take Vital Signs  Increase Vital Sign Frequency  Red: Q 1hr X 4 then Q 4hr X 4, if remains red, continue Q 4hrs  Escalate  MEWS: Escalate Red: discuss with charge nurse/RN and provider, consider discussing with RRT  Notify: Charge Nurse/RN  Name of Charge Nurse/RN Notified Nikki, RN  Date Charge Nurse/RN Notified 01/24/21  Time Charge Nurse/RN Notified 0258  Notify: Provider  Provider Name/Title Hal Hope, MD  Date Provider Notified 01/24/21  Time Provider Notified [phone]  Notification Type Page  Notification Reason Other (Comment) (Notified of 5 more episodes of SVT,HR 140s,non-sustained and K+ 2.8.)  Provider response See new orders  Date of Provider Response 01/24/21  Time of Provider Response [phone] (MD called this RN.)  Document  Patient Outcome Stabilized after interventions   Potassium infusing via IV. Patient was restless in bed but still not following commands. HR sustaining 90s. Will continue to monitor.

## 2021-01-24 NOTE — Progress Notes (Signed)
   01/24/21 0110  Assess: MEWS Score  Temp 99.8 F (37.7 C)  BP (!) 148/51  ECG Heart Rate 99  Resp (!) 24  Level of Consciousness Responds to Pain  SpO2 95 %  O2 Device Room Air  Patient Activity (if Appropriate) In bed  Assess: MEWS Score  MEWS Temp 0  MEWS Systolic 0  MEWS Pulse 0  MEWS RR 1  MEWS LOC 2  MEWS Score 3  MEWS Score Color Yellow  Notify: Provider  Provider Judge Stall, MD  Date Provider Notified 01/24/21  Time Provider Notified 0100  Notification Type Page  Notification Reason Other (Comment) (Per tele, patient has had two episodes of SVT,HR up to 140s non-sustained.)  Provider response See new orders  Date of Provider Response 01/24/21  Time of Provider Response 0119   Patient remains in bed, restless at times. Patient opens eyes with care but not following commands, responding to verbal stimuli, or interacting with staff. EKG done per orders. Will continue to monitor.

## 2021-01-24 NOTE — Progress Notes (Signed)
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  At baseline patient is typically alert and oriented x4 and ambulatory.  Symptoms onset roughly 3 days ago with associated confusion, nausea, chills, weakness and vomiting.  Apparently patient was not taking his lactulose at home.  Patient recently discharged 5 days prior after an 11-day hospitalization due to symptomatic anemia in the setting of recurrent GI bleeding requiring 5 unit PRBC and TIPS procedure with embolization of gastric varices by IR on 01/13/2021.  In the ED, temperature 99.6 F, HR 86, RR 20, BP 141/57, SPO2 99% on room air.  Sodium 142, potassium 3.5, chloride 109, glucose 159, CO2 25, BUN 15, creatinine 1.12.  Alkaline phosphatase 170, AST 28, ALT 21, total bilirubin 1.9.  High sensitive troponin 13>12, within normal limits.  WBC 8.4, hemoglobin 7.4, platelets 108.  Lactic acid 2.6.  Ammonia level 69.  Chest x-ray with cardiomegaly and vascular congestion, no focal consolidation.  EKG with NSR, borderline prolonged QTC 496, first-degree AV block, no dynamic changes.  CT head without contrast with no acute intracranial pathology.  FOBT positive.  EDP discussed case with on-call GI, Dr. Cristina Gong who recommended treatment of hepatic encephalopathy.  TRH consulted for further evaluation and management.   Assessment & Plan:   Principal Problem:   Hepatic encephalopathy ([place]) Active Problems:   Type 2 diabetes with complication (HCC)   GERD  without esophagitis   Hyperlipidemia with target LDL less than 70   CKD (chronic kidney disease), stage III (HCC)   Alcoholic cirrhosis of liver with ascites (HCC)   Hepatocellular carcinoma (HCC)   Acute metabolic encephalopathy Hepatic encephalopathy Patient presenting to the ED via EMS after 3-day history of progressive confusion, weakness.  Recently discharged following symptomatic anemia with subsequent TIPS procedure for gastric varices.  CT head without contrast unrevealing.  Urinalysis unremarkable.  Cova-19 PCR and flu A/B PCR negative.  Ammonia level elevated at 69.  Noncompliant with lactulose at home.  Etiology likely complicated by recent TIPS procedure. --Ammonia [phone] --Continue lactulose enema every 8 hours --If no significant improvement, will consider placement of NG tube to initiate oral lactulose/rifaximin --Continue to monitor ammonia level and mental status closely  Hypokalemia Potassium 2.8 this morning.   --Repleting with IV potassium runs.   --start LR with 40 meq KCL at 75 mL/h --recheck K after K runs complete and 9pm  Normocytic Anemia Recently hospitalized and status posttransfusion 5 unit PRBCs.  Hemoglobin 7.4 on admission (7.3 on 5/22 at previous discharge).  MCV 82.5.  FOBT positive. --Hgb 7.4>>8.0 --Protonix 40mg  IV q12h --Transfuse for hemoglobin less than 7.0 --CBC daily  Hepatic cirrhosis Gastric/esophageal varices Portal gastropathy EGD 5/13 by Dr. Michail Sermon notable for small distal esophagus varices, erosions/erythema gastric antrum, gastric varices within the fundus, mild portal hypertensive gastropathy.  Patient underwent TIPS and embolization of gastric varices by IR on 5/18.  Chronic diastolic congestive heart failure TTE with LVEF 48-88%, grade 3 diastolic dysfunction.  On torsemide and spironolactone outpatient. On room air --Holding oral medications until  mental status improves --Strict I's and O's and daily weights --Monitor renal  function closely daily  Lactic acidosis Etiology unclear, but suspect from dehydration/hepatic encephalopathy.  No signs of focal infectious process, afebrile without leukocytosis, urinalysis unremarkable, chest x-ray without focal consolidation. --Started empirically on antibiotics by admitting physician with vancomycin/cefepime --Check procalcitonin, negative will discontinue antibiotics --Lactic acid level in a.m.  CKD stage IIIa Baseline creatinine 1.2.  Creatinine on admission 1.20, at baseline. --Avoid nephrotoxins, renal dose all medications --Monitor renal function closely daily  Type 2 diabetes mellitus On glimepiride and metformin outpatient. --Hold oral hypoglycemics while inpatient --SSI for coverage --CBGs before every meal/at bedtime  HLD: Holding Lipitor until mental status improves  Morbid obesity Body mass index is 33.99 kg/m.  Discussed with patient needs for aggressive lifestyle changes/weight loss as this complicates all facets of care.  Outpatient follow-up with PCP.     DVT prophylaxis: SCDs, chemical DVT prophylaxis contraindicated with positive FOBT   Code Status: Full Code Family Communication: No family present at bedside this am  Disposition Plan:  Level of care: Progressive Status is: Observation  The patient will require care spanning > 2 midnights and should be moved to inpatient because: Altered mental status, Ongoing diagnostic testing needed not appropriate for outpatient work up, Unsafe d/c plan, IV treatments appropriate due to intensity of illness or inability to take PO and Inpatient level of care appropriate due to severity of illness  Dispo: The patient is from: Home              Anticipated d/c is to: To be determined              Patient currently is not medically stable to d/c.   Difficult to place patient No    Consultants:   EDP discussed care with GI, Dr. Cristina Gong  Procedures:   None  Antimicrobials:   Vancomycin  5/27>>  Cefepime 5/27>>  Metronidazole 5/27 - 5/27   Subjective: Patient seen and examined bedside, sleeping.  Will arouse to stimuli, mumbling speech.  More alert than yesterday.  Continues with Flexi-Seal in place receiving scheduled lactulose enemas every 8 hours.  No family present.  Unable to obtain any further ROS due to his current mental status.  Ammonia level has improved since yesterday.  Hopeful his mentation will slowly/gradually improve over the next 24-48 hours with lactulose administration.  Potassium low this morning and repleting.  No other acute concerns overnight per nursing staff.  Objective: Vitals:   01/24/21 0457 01/24/21 0601 01/24/21 0710 01/24/21 1024  BP: 140/89 (!) 113/99 (!) 124/54 (!) 141/50  Pulse: 100  89 94  Resp: (!) 22 20 19 19   Temp: 99.7 F (37.6 C) 99.1 F (37.3 C) 98.8 F (37.1 C) 98.4 F (36.9 C)  TempSrc: Axillary Axillary Axillary Axillary  SpO2: 99% 96% 96% 96%  Weight: 101.4 kg     Height:        Intake/Output Summary (Last 24 hours) at 01/24/2021 1330 Last data filed at 01/24/2021 3016 Gross per 24 hour  Intake 1237.77 ml  Output 2000 ml  Net -762.23 ml   Filed Weights   01/23/21 0955 01/24/21 0457  Weight: 103.4 kg 101.4 kg    Examination:  General exam: Chronically ill in appearance, obese, no acute distress, somnolent Respiratory system: Coarse breath sounds bilaterally, decreased at bases, mild crackles bilateral bases, normal respiratory effort without accessory muscle use, on room air Cardiovascular system: S1 & S2 heard, RRR. No JVD, murmurs,  rubs, gallops or clicks. No pedal edema. Gastrointestinal system: Abdomen is nondistended, soft and nontender. No organomegaly or masses felt. Normal bowel sounds heard. Central nervous system: Somnolent, incoherent, moves all extremities independently Extremities: Moves all extremities independently Skin: No rashes, lesions or ulcers Psychiatry: Unable to assess due to current  mental status    Data Reviewed: I have personally reviewed following labs and imaging studies  CBC: Recent Labs  Lab 01/22/21 1558 01/22/21 1808 01/23/21 0500 01/23/21 1057 01/23/21 1645 01/23/21 2222 01/24/21 0033  WBC 8.4  --  12.3*  --   --   --  9.3  NEUTROABS 7.7  --   --   --   --   --   --   HGB 7.4*   < > 7.4* 7.8* 7.5* 7.6* 8.0*  HCT 25.2*   < > 26.0* 26.6* 25.3* 25.8* 27.8*  MCV 80.3  --  82.5  --   --   --  80.3  PLT 108*  --  121*  --   --   --  135*   < > = values in this interval not displayed.   Basic Metabolic Panel: Recent Labs  Lab 01/22/21 1558 01/22/21 1808 01/23/21 0500 01/24/21 0033 01/24/21 0934  NA 142 144 143 146*  --   K 3.5 3.5 3.6 2.8* 2.8*  CL 109  --  112* 115*  --   CO2 25  --  20* 20*  --   GLUCOSE 159*  --  180* 160*  --   BUN 15  --  16 21  --   CREATININE 1.12  --  1.20 1.36*  --   CALCIUM 9.3  --  8.7* 8.4*  --   MG  --   --   --  1.9  --    GFR: Estimated Creatinine Clearance: 55 mL/min (A) (by C-G formula based on SCr of 1.36 mg/dL (H)). Liver Function Tests: Recent Labs  Lab 01/22/21 1558 01/23/21 0500 01/24/21 0033  AST 28 37 32  ALT 21 20 19   ALKPHOS 170* 132* 113  BILITOT 1.9* 1.8* 2.1*  PROT 6.8 6.1* 6.0*  ALBUMIN 3.6 3.1* 2.9*   No results for input(s): LIPASE, AMYLASE in the last 168 hours. Recent Labs  Lab 01/22/21 1610 01/23/21 0350 01/24/21 0033  AMMONIA 69* 153* 83*   Coagulation Profile: Recent Labs  Lab 01/22/21 1558  INR 1.3*   Cardiac Enzymes: No results for input(s): CKTOTAL, CKMB, CKMBINDEX, TROPONINI in the last 168 hours. BNP (last 3 results) No results for input(s): PROBNP in the last 8760 hours. HbA1C: No results for input(s): HGBA1C in the last 72 hours. CBG: Recent Labs  Lab 01/23/21 1221 01/23/21 1656 01/23/21 1953 01/24/21 0721 01/24/21 1136  GLUCAP 143* 136* 134* 128* 121*   Lipid Profile: No results for input(s): CHOL, HDL, LDLCALC, TRIG, CHOLHDL, LDLDIRECT in the  last 72 hours. Thyroid Function Tests: Recent Labs    01/24/21 0934  TSH 0.953   Anemia Panel: No results for input(s): VITAMINB12, FOLATE, FERRITIN, TIBC, IRON, RETICCTPCT in the last 72 hours. Sepsis Labs: Recent Labs  Lab 01/22/21 1610 01/22/21 1758 01/23/21 0350 01/23/21 1057  PROCALCITON  --   --   --  0.16  LATICACIDVEN 2.6* 2.5* 3.2*  --     Recent Results (from the past 240 hour(s))  Blood Culture (routine x 2)     Status: None (Preliminary result)   Collection Time: 01/22/21  2:08 PM   Specimen: BLOOD  Result Value Ref Range Status   Specimen Description BLOOD RIGHT ANTECUBITAL  Final   Special Requests   Final    BOTTLES DRAWN AEROBIC AND ANAEROBIC Blood Culture adequate volume   Culture  Setup Time   Final    GRAM POSITIVE COCCI IN CLUSTERS IN BOTH AEROBIC AND ANAEROBIC BOTTLES CRITICAL RESULT CALLED TO, READ BACK BY AND VERIFIED WITH: Gorden Harms PHARMD @1949  01/23/21 EB    Culture   Final    GRAM POSITIVE COCCI CULTURE REINCUBATED FOR BETTER GROWTH Performed at [facility], [place] [address], [place], [place] [address]    Report Status PENDING  Incomplete  Blood Culture ID Panel (Reflexed)     Status: Abnormal   Collection Time: 01/22/21  2:08 PM  Result Value Ref Range Status   Enterococcus faecalis NOT DETECTED NOT DETECTED Final   Enterococcus Faecium NOT DETECTED NOT DETECTED Final   Listeria monocytogenes NOT DETECTED NOT DETECTED Final   Staphylococcus species DETECTED (A) NOT DETECTED Final    Comment: CRITICAL RESULT CALLED TO, READ BACK BY AND VERIFIED WITH: Gorden Harms PHARMD @1949  01/23/21 EB    Staphylococcus aureus (BCID) NOT DETECTED NOT DETECTED Final   Staphylococcus epidermidis DETECTED (A) NOT DETECTED Final    Comment: Methicillin (oxacillin) resistant coagulase negative staphylococcus. Possible blood culture contaminant (unless isolated from more than one blood culture draw or clinical case suggests pathogenicity). No antibiotic  treatment is indicated for blood  culture contaminants. CRITICAL RESULT CALLED TO, READ BACK BY AND VERIFIED WITH: Gorden Harms PHARMD @1949  01/23/21 EB    Staphylococcus lugdunensis NOT DETECTED NOT DETECTED Final   Streptococcus species NOT DETECTED NOT DETECTED Final   Streptococcus agalactiae NOT DETECTED NOT DETECTED Final   Streptococcus pneumoniae NOT DETECTED NOT DETECTED Final   Streptococcus pyogenes NOT DETECTED NOT DETECTED Final   A.calcoaceticus-baumannii NOT DETECTED NOT DETECTED Final   Bacteroides fragilis NOT DETECTED NOT DETECTED Final   Enterobacterales NOT DETECTED NOT DETECTED Final   Enterobacter cloacae complex NOT DETECTED NOT DETECTED Final   Escherichia coli NOT DETECTED NOT DETECTED Final   Klebsiella aerogenes NOT DETECTED NOT DETECTED Final   Klebsiella oxytoca NOT DETECTED NOT DETECTED Final   Klebsiella pneumoniae NOT DETECTED NOT DETECTED Final   Proteus species NOT DETECTED NOT DETECTED Final   Salmonella species NOT DETECTED NOT DETECTED Final   Serratia marcescens NOT DETECTED NOT DETECTED Final   Haemophilus influenzae NOT DETECTED NOT DETECTED Final   Neisseria meningitidis NOT DETECTED NOT DETECTED Final   Pseudomonas aeruginosa NOT DETECTED NOT DETECTED Final   Stenotrophomonas maltophilia NOT DETECTED NOT DETECTED Final   Candida albicans NOT DETECTED NOT DETECTED Final   Candida auris NOT DETECTED NOT DETECTED Final   Candida glabrata NOT DETECTED NOT DETECTED Final   Candida krusei NOT DETECTED NOT DETECTED Final   Candida parapsilosis NOT DETECTED NOT DETECTED Final   Candida tropicalis NOT DETECTED NOT DETECTED Final   Cryptococcus neoformans/gattii NOT DETECTED NOT DETECTED Final   Methicillin resistance mecA/C DETECTED (A) NOT DETECTED Final    Comment: CRITICAL RESULT CALLED TO, READ BACK BY AND VERIFIED WITH: Gorden Harms PHARMD @1949  01/23/21 EB Performed at [facility], [address], [place], [place] [address]   Blood Culture  (routine x 2)     Status: None (Preliminary result)   Collection Time: 01/22/21  4:03 PM   Specimen: BLOOD LEFT WRIST  Result Value Ref Range Status   Specimen Description BLOOD LEFT WRIST  Final  Special Requests   Final    BOTTLES DRAWN AEROBIC AND ANAEROBIC Blood Culture adequate volume   Culture   Final    NO GROWTH 2 DAYS Performed at [facility], [place] [address], [place], [place] [address]    Report Status PENDING  Incomplete  Resp Panel by RT-PCR (Flu A&B, Covid) Nasopharyngeal Swab     Status: None   Collection Time: 01/22/21  4:50 PM   Specimen: Nasopharyngeal Swab; Nasopharyngeal(NP) swabs in vial transport medium  Result Value Ref Range Status   SARS Coronavirus 2 by RT PCR NEGATIVE NEGATIVE Final    Comment: (NOTE) SARS-CoV-2 target nucleic acids are NOT DETECTED.  The SARS-CoV-2 RNA is generally detectable in upper respiratory specimens during the acute phase of infection. The lowest concentration of SARS-CoV-2 viral copies this assay can detect is 138 copies/mL. A negative result does not preclude SARS-Cov-2 infection and should not be used as the sole basis for treatment or other patient management decisions. A negative result may occur with  improper specimen collection/handling, submission of specimen other than nasopharyngeal swab, presence of viral mutation(s) within the areas targeted by this assay, and inadequate number of viral copies(<138 copies/mL). A negative result must be combined with clinical observations, patient history, and epidemiological information. The expected result is Negative.  Fact Sheet for Patients:  EntrepreneurPulse.com.au  Fact Sheet for Healthcare Providers:  IncredibleEmployment.be  This test is no t yet approved or cleared by the [place] FDA and  has been authorized for detection and/or diagnosis of SARS-CoV-2 by FDA under an Emergency Use Authorization (EUA). This EUA will remain   in effect (meaning this test can be used) for the duration of the COVID-19 declaration under Section 564(b)(1) of the Act, 21 U.S.C.section 360bbb-3(b)(1), unless the authorization is terminated  or revoked sooner.       Influenza A by PCR NEGATIVE NEGATIVE Final   Influenza B by PCR NEGATIVE NEGATIVE Final    Comment: (NOTE) The Xpert Xpress SARS-CoV-2/FLU/RSV plus assay is intended as an aid in the diagnosis of influenza from Nasopharyngeal swab specimens and should not be used as a sole basis for treatment. Nasal washings and aspirates are unacceptable for Xpert Xpress SARS-CoV-2/FLU/RSV testing.  Fact Sheet for Patients: EntrepreneurPulse.com.au  Fact Sheet for Healthcare Providers: IncredibleEmployment.be  This test is not yet approved or cleared by the [place] FDA and has been authorized for detection and/or diagnosis of SARS-CoV-2 by FDA under an Emergency Use Authorization (EUA). This EUA will remain in effect (meaning this test can be used) for the duration of the COVID-19 declaration under Section 564(b)(1) of the Act, 21 U.S.C. section 360bbb-3(b)(1), unless the authorization is terminated or revoked.  Performed at [facility], [place] [address], [place], [place] [address]   MRSA PCR Screening     Status: None   Collection Time: 01/24/21  5:04 AM   Specimen: Nasal Mucosa; Nasopharyngeal  Result Value Ref Range Status   MRSA by PCR NEGATIVE NEGATIVE Final    Comment:        The GeneXpert MRSA Assay (FDA approved for NASAL specimens only), is one component of a comprehensive MRSA colonization surveillance program. It is not intended to diagnose MRSA infection nor to guide or monitor treatment for MRSA infections. Performed at [facility], [place] [address] [place], [place] [address]          Radiology Studies: CT Head Wo Contrast  Result Date: 01/22/2021 CLINICAL DATA:  75 year old male  with  delirium. EXAM: CT HEAD WITHOUT CONTRAST TECHNIQUE: Contiguous axial images were obtained from the base of the skull through the vertex without intravenous contrast. COMPARISON:  Head CT dated 05/25/2018. FINDINGS: Brain: The ventricles and sulci appropriate size for patient's age. The gray-white matter discrimination is preserved. There is no acute intracranial hemorrhage. No mass effect or midline shift. No extra-axial fluid collection. Vascular: No hyperdense vessel or unexpected calcification. Skull: Normal. Negative for fracture or focal lesion. Sinuses/Orbits: There is diffuse mucoperiosteal thickening of paranasal sinuses. No air-fluid level. The mastoid air cells are clear. Other: None IMPRESSION: 1. No acute intracranial pathology. 2. Paranasal sinus disease. Electronically Signed   By: Anner Crete M.D.   On: 01/22/2021 18:33   DG Chest Port 1 View  Result Date: 01/22/2021 CLINICAL DATA:  75 year old male with concern for sepsis. EXAM: PORTABLE CHEST 1 VIEW COMPARISON:  Chest radiograph dated 11/20/2020. FINDINGS: There is cardiomegaly with vascular congestion. No focal consolidation, pleural effusion, or pneumothorax. Median sternotomy wires and CABG vascular clips. No acute osseous pathology. IMPRESSION: Cardiomegaly with vascular congestion.  No focal consolidation. Electronically Signed   By: Anner Crete M.D.   On: 01/22/2021 16:29        Scheduled Meds: . insulin aspart  0-5 Units Subcutaneous QHS  . insulin aspart  0-9 Units Subcutaneous TID WC  . lactulose  300 mL Rectal Q8H  . pantoprazole (PROTONIX) IV  40 mg Intravenous Q12H  . polyethylene glycol  8.5 g Oral TID  . umeclidinium-vilanterol  1 puff Inhalation Daily   Continuous Infusions: . sodium chloride    . lactated ringers with kcl    . potassium chloride 10 mEq (01/24/21 1241)     LOS: 1 day    Time spent: 46 minutes spent on chart review, discussion with nursing staff, consultants, updating family and  interview/physical exam; more than 50% of that time was spent in counseling and/or coordination of care.    Abrham Maslowski J [place] DO [facility] Available via Epic secure chat 7am-7pm After these hours, please refer to coverage provider listed on [email or web address] 01/24/2021, 1:30 PM

## 2021-01-25 LAB — COMPREHENSIVE METABOLIC PANEL
ALT: 22 U/L (ref 0–44)
AST: 42 U/L — ABNORMAL HIGH (ref 15–41)
Albumin: 2.7 g/dL — ABNORMAL LOW (ref 3.5–5.0)
Alkaline Phosphatase: 124 U/L (ref 38–126)
Anion gap: 8 (ref 5–15)
BUN: 24 mg/dL — ABNORMAL HIGH (ref 8–23)
CO2: 21 mmol/L — ABNORMAL LOW (ref 22–32)
Calcium: 8.4 mg/dL — ABNORMAL LOW (ref 8.9–10.3)
Chloride: 118 mmol/L — ABNORMAL HIGH (ref 98–111)
Creatinine, Ser: 1.24 mg/dL (ref 0.61–1.24)
GFR, Estimated: >60 mL/min (ref >60)
Glucose, Bld: 134 mg/dL — ABNORMAL HIGH (ref 70–99)
Potassium: 3.6 mmol/L (ref 3.5–5.1)
Sodium: 147 mmol/L — ABNORMAL HIGH (ref 135–145)
Total Bilirubin: 2 mg/dL — ABNORMAL HIGH (ref 0.3–1.2)
Total Protein: 5.6 g/dL — ABNORMAL LOW (ref 6.5–8.1)

## 2021-01-25 LAB — CBC
HCT: 27.4 % — ABNORMAL LOW (ref 39.0–52.0)
Hemoglobin: 7.8 g/dL — ABNORMAL LOW (ref 13.0–17.0)
MCH: 23.7 pg — ABNORMAL LOW (ref 26.0–34.0)
MCHC: 28.5 g/dL — ABNORMAL LOW (ref 30.0–36.0)
MCV: 83.3 fL (ref 80.0–100.0)
Platelets: 122 10*3/uL — ABNORMAL LOW (ref 150–400)
RBC: 3.29 MIL/uL — ABNORMAL LOW (ref 4.22–5.81)
RDW: 23 % — ABNORMAL HIGH (ref 11.5–15.5)
WBC: 9.8 10*3/uL (ref 4.0–10.5)
nRBC: 0.4 % — ABNORMAL HIGH (ref 0.0–0.2)

## 2021-01-25 LAB — CULTURE, BLOOD (ROUTINE X 2): Special Requests: ADEQUATE

## 2021-01-25 LAB — GLUCOSE, CAPILLARY
Glucose-Capillary: 114 mg/dL — ABNORMAL HIGH (ref 70–99)
Glucose-Capillary: 153 mg/dL — ABNORMAL HIGH (ref 70–99)
Glucose-Capillary: 155 mg/dL — ABNORMAL HIGH (ref 70–99)
Glucose-Capillary: 152 mg/dL — ABNORMAL HIGH (ref 70–99)

## 2021-01-25 LAB — LACTIC ACID, PLASMA: Lactic Acid, Venous: 2.9 mmol/L (ref 0.5–1.9)

## 2021-01-25 LAB — MAGNESIUM: Magnesium: 2.2 mg/dL (ref 1.7–2.4)

## 2021-01-25 LAB — AMMONIA: Ammonia: 16 umol/L (ref 9–35)

## 2021-01-25 MED ORDER — RIFAXIMIN 550 MG PO TABS
550.0000 mg | ORAL_TABLET | Freq: Two times a day (BID) | ORAL | Status: DC
  Administered 2021-01-25 – 2021-01-30 (×11): 550 mg via ORAL
  Filled 2021-01-25 (×11): qty 1

## 2021-01-25 MED ORDER — POTASSIUM CHLORIDE CRYS ER 20 MEQ PO TBCR
40.0000 meq | EXTENDED_RELEASE_TABLET | Freq: Once | ORAL | Status: AC
  Administered 2021-01-25: 40 meq via ORAL
  Filled 2021-01-25: qty 2

## 2021-01-25 MED ORDER — PANTOPRAZOLE SODIUM 40 MG PO TBEC
40.0000 mg | DELAYED_RELEASE_TABLET | Freq: Two times a day (BID) | ORAL | Status: DC
  Administered 2021-01-25 – 2021-01-30 (×10): 40 mg via ORAL
  Filled 2021-01-25 (×10): qty 1

## 2021-01-25 MED ORDER — LACTULOSE 10 GM/15ML PO SOLN
30.0000 g | Freq: Three times a day (TID) | ORAL | Status: DC
  Administered 2021-01-25 – 2021-01-27 (×8): 30 g via ORAL
  Filled 2021-01-25 (×8): qty 45

## 2021-01-25 MED ORDER — CHLORHEXIDINE GLUCONATE 0.12 % MT SOLN
15.0000 mL | Freq: Two times a day (BID) | OROMUCOSAL | Status: DC
  Administered 2021-01-25 – 2021-01-30 (×11): 15 mL via OROMUCOSAL
  Filled 2021-01-25 (×11): qty 15

## 2021-01-25 MED ORDER — ORAL CARE MOUTH RINSE
15.0000 mL | Freq: Two times a day (BID) | OROMUCOSAL | Status: DC
  Administered 2021-01-25 – 2021-01-29 (×8): 15 mL via OROMUCOSAL

## 2021-01-25 NOTE — Evaluation (Signed)
Physical Therapy Evaluation Patient Details Name: [name] MRN: [id number] DOB: Aug 19, 1946 Today's Date: 01/25/2021   History of Present Illness  Pt is a 75 y/o male admitted secondary to AMS. Likely secondary to hepatic encephalopathy. Pt with recent admission in early 12/2020 for GI bleed. PMH includes CAD s/p CABG, dCHF, HTN, alcoholic cirrhosis, and hepatocellular carcinoma.  Clinical Impression  Pt admitted secondary to problem above with deficits below. Pt with cognitive deficits and slow processing noted throughout. Requiring min to mod A for transfers and to take steps at EOB. Pt with increased SOB, however, oxygen monitor did not have good pleth and was not reading. Unsure of baseline as no family present. Feel he would benefit from SNF level therapies at this time to increase independence and safety with mobility. Will continue to follow acutely.     Follow Up Recommendations SNF;Supervision/Assistance - 24 hour (unless family can provide necessary support)    Equipment Recommendations  Other (comment) (TBD pending progression)    Recommendations for Other Services       Precautions / Restrictions Precautions Precautions: Fall Precaution Comments: flexiseal Restrictions Weight Bearing Restrictions: No      Mobility  Bed Mobility               General bed mobility comments: Sitting EOB upon entry    Transfers Overall transfer level: Needs assistance Equipment used: Rolling walker (2 wheeled) Transfers: Sit to/from Stand Sit to Stand: Mod assist         General transfer comment: Mod A for lift assist and steadying to stand.  Ambulation/Gait Ambulation/Gait assistance: Min assist Gait Distance (Feet): 5 Feet Assistive device: Rolling walker (2 wheeled) Gait Pattern/deviations: Step-through pattern;Decreased stride length Gait velocity: Decreased   General Gait Details: Slow, guarded steps at EOB. Pt with notable SOB; oxygen sats not getting good  pleth and did not read. However, pt reporting he felt fine.  Stairs            Wheelchair Mobility    Modified Rankin (Stroke Patients Only)       Balance Overall balance assessment: Needs assistance Sitting-balance support: No upper extremity supported;Feet supported Sitting balance-Leahy Scale: Fair     Standing balance support: Bilateral upper extremity supported;During functional activity Standing balance-Leahy Scale: Poor Standing balance comment: Reliant on BUE support                             Pertinent Vitals/Pain Pain Assessment: Faces Faces Pain Scale: Hurts little more Pain Location: back Pain Descriptors / Indicators: Aching Pain Intervention(s): Limited activity within patient's tolerance;Monitored during session;Repositioned    Home Living Family/patient expects to be discharged to:: Private residence Living Arrangements: Spouse/significant other;Children Available Help at Discharge: Family;Available 24 hours/day Type of Home: House Home Access: Stairs to enter Entrance Stairs-Rails: Left Entrance Stairs-Number of Steps: 1 Home Layout: Two level;Able to live on main level with bedroom/bathroom Home Equipment: Kasandra Knudsen - single point;Walker - 2 wheels;Shower seat;Bedside commode      Prior Function Level of Independence: Needs assistance   Gait / Transfers Assistance Needed: Uses RW for ambulation.  ADL's / Homemaking Assistance Needed: Reports wife and son assist with med management. Reports independence with ADLs.  Comments: Unsure of accuracy as no family present     Hand Dominance        Extremity/Trunk Assessment   Upper Extremity Assessment Upper Extremity Assessment: Defer to OT evaluation    Lower Extremity Assessment  Lower Extremity Assessment: Generalized weakness    Cervical / Trunk Assessment Cervical / Trunk Assessment: Kyphotic  Communication   Communication: HOH  Cognition Arousal/Alertness:  Awake/alert Behavior During Therapy: Flat affect Overall Cognitive Status: No family/caregiver present to determine baseline cognitive functioning                                 General Comments: Disoriented to situation and time. Very flat affect with slow processing noted. Decreased awareness of deficits. Pt improperly using urinal upon entry and urine on floor; did not realize he had not used urinal correctly.      General Comments      Exercises     Assessment/Plan    PT Assessment Patient needs continued PT services  PT Problem List Decreased strength;Decreased balance;Decreased activity tolerance;Decreased mobility;Decreased knowledge of use of DME;Decreased cognition;Decreased safety awareness;Decreased knowledge of precautions       PT Treatment Interventions Gait training;DME instruction;Therapeutic exercise;Functional mobility training;Stair training;Therapeutic activities;Balance training;Patient/family education    PT Goals (Current goals can be found in the Care Plan section)  Acute Rehab PT Goals Patient Stated Goal: none stated PT Goal Formulation: With patient Time For Goal Achievement: 02/08/21 Potential to Achieve Goals: Fair    Frequency Min 2X/week   Barriers to discharge        Co-evaluation               AM-PAC PT "6 Clicks" Mobility  Outcome Measure Help needed turning from your back to your side while in a flat bed without using bedrails?: A Little Help needed moving from lying on your back to sitting on the side of a flat bed without using bedrails?: A Little Help needed moving to and from a bed to a chair (including a wheelchair)?: A Little Help needed standing up from a chair using your arms (e.g., wheelchair or bedside chair)?: A Lot Help needed to walk in hospital room?: A Lot Help needed climbing 3-5 steps with a railing? : A Lot 6 Click Score: 15    End of Session Equipment Utilized During Treatment: Gait  belt Activity Tolerance: Patient limited by fatigue Patient left: in bed;with call bell/phone within reach;with bed alarm set Nurse Communication: Mobility status PT Visit Diagnosis: Unsteadiness on feet (R26.81);Muscle weakness (generalized) (M62.81)    Time: 3383-2919 PT Time Calculation (min) (ACUTE ONLY): 18 min   Charges:   PT Evaluation $PT Eval Moderate Complexity: 1 Mod          Reuel Derby, PT, DPT  Acute Rehabilitation Services  Pager: [phone] Office: [phone]   Rudean Hitt 01/25/2021, 2:54 PM

## 2021-01-25 NOTE — Progress Notes (Addendum)
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  At baseline patient is typically alert and oriented x4 and ambulatory.  Symptoms onset roughly 3 days ago with associated confusion, nausea, chills, weakness and vomiting.  Apparently patient was not taking his lactulose at home.  Patient recently discharged 5 days prior after an 11-day hospitalization due to symptomatic anemia in the setting of recurrent GI bleeding requiring 5 unit PRBC and TIPS procedure with embolization of gastric varices by IR on 01/13/2021.  In the ED, temperature 99.6 F, HR 86, RR 20, BP 141/57, SPO2 99% on room air.  Sodium 142, potassium 3.5, chloride 109, glucose 159, CO2 25, BUN 15, creatinine 1.12.  Alkaline phosphatase 170, AST 28, ALT 21, total bilirubin 1.9.  High sensitive troponin 13>12, within normal limits.  WBC 8.4, hemoglobin 7.4, platelets 108.  Lactic acid 2.6.  Ammonia level 69.  Chest x-ray with cardiomegaly and vascular congestion, no focal consolidation.  EKG with NSR, borderline prolonged QTC 496, first-degree AV block, no dynamic changes.  CT head without contrast with no acute intracranial pathology.  FOBT positive.  EDP discussed case with on-call GI, Dr. Cristina Gong who recommended treatment of hepatic encephalopathy.  TRH consulted for further evaluation and management.   Assessment & Plan:   Principal Problem:   Hepatic encephalopathy ([place]) Active Problems:   Type 2 diabetes with complication (HCC)   GERD  without esophagitis   Hyperlipidemia with target LDL less than 70   CKD (chronic kidney disease), stage III (HCC)   Alcoholic cirrhosis of liver with ascites (HCC)   Hepatocellular carcinoma (HCC)   Acute metabolic encephalopathy Hepatic encephalopathy Patient presenting to the ED via EMS after 3-day history of progressive confusion, weakness.  Recently discharged following symptomatic anemia with subsequent TIPS procedure for gastric varices.  CT head without contrast unrevealing.  Urinalysis unremarkable.  Cova-19 PCR and flu A/B PCR negative.  Ammonia level elevated at 69.  Noncompliant with lactulose at home.  Etiology likely complicated by recent TIPS procedure. --Ammonia [phone] --Transition lactulose enema to lactulose 30 g p.o. 3 times daily now has passed swallow evaluation, goal 3-4 soft BMs daily --Start rifaximin 550 mg p.o. twice daily; may need benefit check prior to discharge for possible cost issue --Continue to monitor ammonia level and mental status closely  Hypokalemia Potassium 3.6 this morning.  Will replete. -- Discontinue IV fluids now passed swallow evaluation today --follow electrolytes daily  Normocytic Anemia Recently hospitalized and status posttransfusion 5 unit PRBCs.  Hemoglobin 7.4 on admission (7.3 on 5/22 at previous discharge).  MCV 82.5.  FOBT positive. --Hgb 7.4>>8.0>7.8 --Protonix 40mg  PO q12h --Transfuse for hemoglobin less than 7.0 --CBC daily  Hepatic cirrhosis Gastric/esophageal varices Portal gastropathy EGD 5/13 by Dr. Michail Sermon notable for small distal esophagus varices, erosions/erythema gastric antrum, gastric varices within the fundus, mild portal hypertensive gastropathy.  Patient underwent TIPS and embolization of gastric varices by IR on 5/18.  Chronic diastolic congestive heart failure TTE with LVEF 93-90%, grade 3 diastolic dysfunction.  On torsemide and spironolactone  outpatient. On room air --Consider restarting torsemide  and spironolactone tomorrow now that mental status is improved and as long as renal function remains stable --Strict I's and O's and daily weights --Monitor renal function closely daily  Lactic acidosis Etiology unclear, but suspect from dehydration/hepatic encephalopathy.  No signs of focal infectious process, afebrile without leukocytosis, urinalysis unremarkable, chest x-ray without focal consolidation. Started empirically on antibiotics by admitting physician with vancomycin and cefepime; discontinued as his etiology of lactic acidosis related to his encephalopathy from rather than infectious cause.  CKD stage IIIa Baseline creatinine 1.2.   --Cr 1.24 today, at baseline --Avoid nephrotoxins, renal dose all medications --Monitor renal function closely daily  Type 2 diabetes mellitus On glimepiride and metformin outpatient. --Hold oral hypoglycemics while inpatient --SSI for coverage --CBGs before every meal/at bedtime  HLD: Consider restarting Lipitor tomorrow  Morbid obesity Body mass index is 33.99 kg/m.  Discussed with patient needs for aggressive lifestyle changes/weight loss as this complicates all facets of care.  Outpatient follow-up with PCP.   Weakness/deconditioning: --PT/OT evaluation    DVT prophylaxis: SCDs, chemical DVT prophylaxis contraindicated with positive FOBT   Code Status: Full Code Family Communication: No family present at bedside this am  Disposition Plan:  Level of care: Telemetry Medical Status is: Observation  The patient will require care spanning > 2 midnights and should be moved to inpatient because: Altered mental status, Ongoing diagnostic testing needed not appropriate for outpatient work up, Unsafe d/c plan, IV treatments appropriate due to intensity of illness or inability to take PO and Inpatient level of care appropriate due to severity of illness  Dispo: The patient is from: Home              Anticipated d/c is to: To be determined               Patient currently is not medically stable to d/c.   Difficult to place patient No    Consultants:   EDP discussed care with GI, Dr. Cristina Gong  Procedures:   None  Antimicrobials:   Vancomycin 5/27 - 5/28  Cefepime 5/27 - 5/28  Metronidazole 5/27 - 5/27   Subjective: Patient seen and examined bedside.  Mental status much improved today.  Is alert but remains confused.  Ammonia level down to 16.  Continues on lactulose enemas every 8 hours until he passes swallow evaluation today.  No family present at bedside.  Patient denies headache, no chest pain, no abdominal pain.  No acute concerns overnight per nursing staff.  Objective: Vitals:   01/25/21 0348 01/25/21 0400 01/25/21 0739 01/25/21 0755  BP: (!) 152/66   (!) 142/61  Pulse: 86 86  86  Resp: 18   16  Temp: 98.1 F (36.7 C)   98.5 F (36.9 C)  TempSrc: Oral   Oral  SpO2: 99% 98% 99% 99%  Weight:      Height:        Intake/Output Summary (Last 24 hours) at 01/25/2021 1041 Last data filed at 01/25/2021 1016 Gross per 24 hour  Intake 3219.14 ml  Output 1600 ml  Net 1619.14 ml   Filed Weights   01/23/21 0955 01/24/21 0457  Weight: 103.4 kg 101.4 kg    Examination:  General exam: NAD, alert but confused, chronically ill in appearance Respiratory system: Coarse breath sounds bilaterally, decreased at bases, normal respiratory effort without accessory muscle use, on room air Cardiovascular system: S1 & S2 heard, RRR. No JVD, murmurs, rubs, gallops or clicks.  No pedal edema. Gastrointestinal system: Abdomen is nondistended, soft and nontender. No organomegaly or masses felt. Normal bowel sounds heard. Central nervous system: Alert, oriented to place (hospital), not time ("July 202020...), not person (no answer), or situation Extremities: Moves all extremities independently Skin: No rashes, lesions or ulcers Psychiatry: Unable to assess due to current mental status    Data Reviewed: I have personally  reviewed following labs and imaging studies  CBC: Recent Labs  Lab 01/22/21 1558 01/22/21 1808 01/23/21 0500 01/23/21 1057 01/23/21 1645 01/23/21 2222 01/24/21 0033 01/25/21 0036  WBC 8.4  --  12.3*  --   --   --  9.3 9.8  NEUTROABS 7.7  --   --   --   --   --   --   --   HGB 7.4*   < > 7.4* 7.8* 7.5* 7.6* 8.0* 7.8*  HCT 25.2*   < > 26.0* 26.6* 25.3* 25.8* 27.8* 27.4*  MCV 80.3  --  82.5  --   --   --  80.3 83.3  PLT 108*  --  121*  --   --   --  135* 122*   < > = values in this interval not displayed.   Basic Metabolic Panel: Recent Labs  Lab 01/22/21 1558 01/22/21 1808 01/23/21 0500 01/24/21 0033 01/24/21 0934 01/24/21 2044 01/25/21 0036  NA 142 144 143 146*  --   --  147*  K 3.5 3.5 3.6 2.8* 2.8* 3.4* 3.6  CL 109  --  112* 115*  --   --  118*  CO2 25  --  20* 20*  --   --  21*  GLUCOSE 159*  --  180* 160*  --   --  134*  BUN 15  --  16 21  --   --  24*  CREATININE 1.12  --  1.20 1.36*  --   --  1.24  CALCIUM 9.3  --  8.7* 8.4*  --   --  8.4*  MG  --   --   --  1.9  --   --  2.2   GFR: Estimated Creatinine Clearance: 60.3 mL/min (by C-G formula based on SCr of 1.24 mg/dL). Liver Function Tests: Recent Labs  Lab 01/22/21 1558 01/23/21 0500 01/24/21 0033 01/25/21 0036  AST 28 37 32 42*  ALT 21 20 19 22   ALKPHOS 170* 132* 113 124  BILITOT 1.9* 1.8* 2.1* 2.0*  PROT 6.8 6.1* 6.0* 5.6*  ALBUMIN 3.6 3.1* 2.9* 2.7*   No results for input(s): LIPASE, AMYLASE in the last 168 hours. Recent Labs  Lab 01/22/21 1610 01/23/21 0350 01/24/21 0033 01/25/21 0730  AMMONIA 69* 153* 83* 16   Coagulation Profile: Recent Labs  Lab 01/22/21 1558  INR 1.3*   Cardiac Enzymes: No results for input(s): CKTOTAL, CKMB, CKMBINDEX, TROPONINI in the last 168 hours. BNP (last 3 results) No results for input(s): PROBNP in the last 8760 hours. HbA1C: No results for input(s): HGBA1C in the last 72 hours. CBG: Recent Labs  Lab 01/24/21 0721 01/24/21 1136 01/24/21 1705  01/24/21 2058 01/25/21 0755  GLUCAP 128* 121* 128* 117* 114*   Lipid Profile: No results for input(s): CHOL, HDL, LDLCALC, TRIG, CHOLHDL, LDLDIRECT in the last 72 hours. Thyroid Function Tests: Recent Labs    01/24/21 0934  TSH 0.953   Anemia Panel: No results for input(s): VITAMINB12, FOLATE, FERRITIN, TIBC, IRON, RETICCTPCT in the last 72 hours. Sepsis Labs: Recent Labs  Lab 01/22/21 1610  01/22/21 1758 01/23/21 0350 01/23/21 1057 01/25/21 0036  PROCALCITON  --   --   --  0.16  --   LATICACIDVEN 2.6* 2.5* 3.2*  --  2.9*    Recent Results (from the past 240 hour(s))  Blood Culture (routine x 2)     Status: Abnormal   Collection Time: 01/22/21  2:08 PM   Specimen: BLOOD  Result Value Ref Range Status   Specimen Description BLOOD RIGHT ANTECUBITAL  Final   Special Requests   Final    BOTTLES DRAWN AEROBIC AND ANAEROBIC Blood Culture adequate volume   Culture  Setup Time   Final    GRAM POSITIVE COCCI IN CLUSTERS IN BOTH AEROBIC AND ANAEROBIC BOTTLES CRITICAL RESULT CALLED TO, READ BACK BY AND VERIFIED WITH: Gorden Harms PHARMD @1949  01/23/21 EB    Culture (A)  Final    STAPHYLOCOCCUS EPIDERMIDIS THE SIGNIFICANCE OF ISOLATING THIS ORGANISM FROM A SINGLE SET OF BLOOD CULTURES WHEN MULTIPLE SETS ARE DRAWN IS UNCERTAIN. PLEASE NOTIFY THE MICROBIOLOGY DEPARTMENT WITHIN ONE WEEK IF SPECIATION AND SENSITIVITIES ARE REQUIRED. Performed at [facility], [place] [address] [place], [place] [address]    Report Status 01/25/2021 FINAL  Final  Blood Culture ID Panel (Reflexed)     Status: Abnormal   Collection Time: 01/22/21  2:08 PM  Result Value Ref Range Status   Enterococcus faecalis NOT DETECTED NOT DETECTED Final   Enterococcus Faecium NOT DETECTED NOT DETECTED Final   Listeria monocytogenes NOT DETECTED NOT DETECTED Final   Staphylococcus species DETECTED (A) NOT DETECTED Final    Comment: CRITICAL RESULT CALLED TO, READ BACK BY AND VERIFIED WITH: Gorden Harms PHARMD @1949   01/23/21 EB    Staphylococcus aureus (BCID) NOT DETECTED NOT DETECTED Final   Staphylococcus epidermidis DETECTED (A) NOT DETECTED Final    Comment: Methicillin (oxacillin) resistant coagulase negative staphylococcus. Possible blood culture contaminant (unless isolated from more than one blood culture draw or clinical case suggests pathogenicity). No antibiotic treatment is indicated for blood  culture contaminants. CRITICAL RESULT CALLED TO, READ BACK BY AND VERIFIED WITH: KIM HURTH PHARMD @1949  01/23/21 EB    Staphylococcus lugdunensis NOT DETECTED NOT DETECTED Final   Streptococcus species NOT DETECTED NOT DETECTED Final   Streptococcus agalactiae NOT DETECTED NOT DETECTED Final   Streptococcus pneumoniae NOT DETECTED NOT DETECTED Final   Streptococcus pyogenes NOT DETECTED NOT DETECTED Final   A.calcoaceticus-baumannii NOT DETECTED NOT DETECTED Final   Bacteroides fragilis NOT DETECTED NOT DETECTED Final   Enterobacterales NOT DETECTED NOT DETECTED Final   Enterobacter cloacae complex NOT DETECTED NOT DETECTED Final   Escherichia coli NOT DETECTED NOT DETECTED Final   Klebsiella aerogenes NOT DETECTED NOT DETECTED Final   Klebsiella oxytoca NOT DETECTED NOT DETECTED Final   Klebsiella pneumoniae NOT DETECTED NOT DETECTED Final   Proteus species NOT DETECTED NOT DETECTED Final   Salmonella species NOT DETECTED NOT DETECTED Final   Serratia marcescens NOT DETECTED NOT DETECTED Final   Haemophilus influenzae NOT DETECTED NOT DETECTED Final   Neisseria meningitidis NOT DETECTED NOT DETECTED Final   Pseudomonas aeruginosa NOT DETECTED NOT DETECTED Final   Stenotrophomonas maltophilia NOT DETECTED NOT DETECTED Final   Candida albicans NOT DETECTED NOT DETECTED Final   Candida auris NOT DETECTED NOT DETECTED Final   Candida glabrata NOT DETECTED NOT DETECTED Final   Candida krusei NOT DETECTED NOT DETECTED Final   Candida parapsilosis NOT DETECTED NOT DETECTED Final   Candida tropicalis  NOT DETECTED NOT DETECTED Final  Cryptococcus neoformans/gattii NOT DETECTED NOT DETECTED Final   Methicillin resistance mecA/C DETECTED (A) NOT DETECTED Final    Comment: CRITICAL RESULT CALLED TO, READ BACK BY AND VERIFIED WITH: Gorden Harms PHARMD @1949  01/23/21 EB Performed at [facility], [place] [address], [place], [place] [address]   Blood Culture (routine x 2)     Status: None (Preliminary result)   Collection Time: 01/22/21  4:03 PM   Specimen: BLOOD LEFT WRIST  Result Value Ref Range Status   Specimen Description BLOOD LEFT WRIST  Final   Special Requests   Final    BOTTLES DRAWN AEROBIC AND ANAEROBIC Blood Culture adequate volume   Culture   Final    NO GROWTH 3 DAYS Performed at [facility], [place] [address], [place], [place] [address]    Report Status PENDING  Incomplete  Resp Panel by RT-PCR (Flu A&B, Covid) Nasopharyngeal Swab     Status: None   Collection Time: 01/22/21  4:50 PM   Specimen: Nasopharyngeal Swab; Nasopharyngeal(NP) swabs in vial transport medium  Result Value Ref Range Status   SARS Coronavirus 2 by RT PCR NEGATIVE NEGATIVE Final    Comment: (NOTE) SARS-CoV-2 target nucleic acids are NOT DETECTED.  The SARS-CoV-2 RNA is generally detectable in upper respiratory specimens during the acute phase of infection. The lowest concentration of SARS-CoV-2 viral copies this assay can detect is 138 copies/mL. A negative result does not preclude SARS-Cov-2 infection and should not be used as the sole basis for treatment or other patient management decisions. A negative result may occur with  improper specimen collection/handling, submission of specimen other than nasopharyngeal swab, presence of viral mutation(s) within the areas targeted by this assay, and inadequate number of viral copies(<138 copies/mL). A negative result must be combined with clinical observations, patient history, and epidemiological information. The expected result is  Negative.  Fact Sheet for Patients:  EntrepreneurPulse.com.au  Fact Sheet for Healthcare Providers:  IncredibleEmployment.be  This test is no t yet approved or cleared by the [place] FDA and  has been authorized for detection and/or diagnosis of SARS-CoV-2 by FDA under an Emergency Use Authorization (EUA). This EUA will remain  in effect (meaning this test can be used) for the duration of the COVID-19 declaration under Section 564(b)(1) of the Act, 21 U.S.C.section 360bbb-3(b)(1), unless the authorization is terminated  or revoked sooner.       Influenza A by PCR NEGATIVE NEGATIVE Final   Influenza B by PCR NEGATIVE NEGATIVE Final    Comment: (NOTE) The Xpert Xpress SARS-CoV-2/FLU/RSV plus assay is intended as an aid in the diagnosis of influenza from Nasopharyngeal swab specimens and should not be used as a sole basis for treatment. Nasal washings and aspirates are unacceptable for Xpert Xpress SARS-CoV-2/FLU/RSV testing.  Fact Sheet for Patients: EntrepreneurPulse.com.au  Fact Sheet for Healthcare Providers: IncredibleEmployment.be  This test is not yet approved or cleared by the [place] FDA and has been authorized for detection and/or diagnosis of SARS-CoV-2 by FDA under an Emergency Use Authorization (EUA). This EUA will remain in effect (meaning this test can be used) for the duration of the COVID-19 declaration under Section 564(b)(1) of the Act, 21 U.S.C. section 360bbb-3(b)(1), unless the authorization is terminated or revoked.  Performed at [facility], [place] [address] [place], [place] [address]   MRSA PCR Screening     Status: None   Collection Time: 01/24/21  5:04 AM   Specimen: Nasal Mucosa; Nasopharyngeal  Result Value  Ref Range Status   MRSA by PCR NEGATIVE NEGATIVE Final    Comment:        The GeneXpert MRSA Assay (FDA approved for NASAL specimens only), is one  component of a comprehensive MRSA colonization surveillance program. It is not intended to diagnose MRSA infection nor to guide or monitor treatment for MRSA infections. Performed at [facility], [place] [address], [place], [place] [address]          Radiology Studies: No results found.      Scheduled Meds: . chlorhexidine  15 mL Mouth Rinse BID  . insulin aspart  0-5 Units Subcutaneous QHS  . insulin aspart  0-9 Units Subcutaneous TID WC  . lactulose  30 g Oral TID  . mouth rinse  15 mL Mouth Rinse q12n4p  . pantoprazole  40 mg Oral BID  . potassium chloride  40 mEq Oral Once  . rifaximin  550 mg Oral BID  . umeclidinium-vilanterol  1 puff Inhalation Daily   Continuous Infusions: . sodium chloride       LOS: 2 days    Time spent: 38 minutes spent on chart review, discussion with nursing staff, consultants, updating family and interview/physical exam; more than 50% of that time was spent in counseling and/or coordination of care.    Jeri Jeanbaptiste J [place] DO [facility] Available via Epic secure chat 7am-7pm After these hours, please refer to coverage provider listed on [email or web address] 01/25/2021, 10:41 AM

## 2021-01-26 ENCOUNTER — Inpatient Hospital Stay: Payer: Medicare HMO | Admitting: Internal Medicine

## 2021-01-26 LAB — CBC
HCT: 23.4 % — ABNORMAL LOW (ref 39.0–52.0)
Hemoglobin: 6.9 g/dL — CL (ref 13.0–17.0)
MCH: 23.8 pg — ABNORMAL LOW (ref 26.0–34.0)
MCHC: 29.5 g/dL — ABNORMAL LOW (ref 30.0–36.0)
MCV: 80.7 fL (ref 80.0–100.0)
Platelets: 120 10*3/uL — ABNORMAL LOW (ref 150–400)
RBC: 2.9 MIL/uL — ABNORMAL LOW (ref 4.22–5.81)
RDW: 22.1 % — ABNORMAL HIGH (ref 11.5–15.5)
WBC: 9.1 10*3/uL (ref 4.0–10.5)
nRBC: 0.3 % — ABNORMAL HIGH (ref 0.0–0.2)

## 2021-01-26 LAB — COMPREHENSIVE METABOLIC PANEL
ALT: 48 U/L — ABNORMAL HIGH (ref 0–44)
AST: 82 U/L — ABNORMAL HIGH (ref 15–41)
Albumin: 2.6 g/dL — ABNORMAL LOW (ref 3.5–5.0)
Alkaline Phosphatase: 356 U/L — ABNORMAL HIGH (ref 38–126)
Anion gap: 7 (ref 5–15)
BUN: 21 mg/dL (ref 8–23)
CO2: 20 mmol/L — ABNORMAL LOW (ref 22–32)
Calcium: 8.3 mg/dL — ABNORMAL LOW (ref 8.9–10.3)
Chloride: 110 mmol/L (ref 98–111)
Creatinine, Ser: 1 mg/dL (ref 0.61–1.24)
GFR, Estimated: >60 mL/min (ref >60)
Glucose, Bld: 144 mg/dL — ABNORMAL HIGH (ref 70–99)
Potassium: 3.1 mmol/L — ABNORMAL LOW (ref 3.5–5.1)
Sodium: 137 mmol/L (ref 135–145)
Total Bilirubin: 1.9 mg/dL — ABNORMAL HIGH (ref 0.3–1.2)
Total Protein: 5.3 g/dL — ABNORMAL LOW (ref 6.5–8.1)

## 2021-01-26 LAB — URINE CULTURE: Culture: NO GROWTH

## 2021-01-26 LAB — GLUCOSE, CAPILLARY
Glucose-Capillary: 117 mg/dL — ABNORMAL HIGH (ref 70–99)
Glucose-Capillary: 125 mg/dL — ABNORMAL HIGH (ref 70–99)
Glucose-Capillary: 132 mg/dL — ABNORMAL HIGH (ref 70–99)
Glucose-Capillary: 117 mg/dL — ABNORMAL HIGH (ref 70–99)

## 2021-01-26 LAB — HEMOGLOBIN AND HEMATOCRIT, BLOOD
HCT: 26.8 % — ABNORMAL LOW (ref 39.0–52.0)
Hemoglobin: 8 g/dL — ABNORMAL LOW (ref 13.0–17.0)

## 2021-01-26 LAB — AMMONIA: Ammonia: 28 umol/L (ref 9–35)

## 2021-01-26 LAB — PREPARE RBC (CROSSMATCH)

## 2021-01-26 LAB — LACTIC ACID, PLASMA: Lactic Acid, Venous: 1.9 mmol/L (ref 0.5–1.9)

## 2021-01-26 LAB — MAGNESIUM: Magnesium: 2 mg/dL (ref 1.7–2.4)

## 2021-01-26 MED ORDER — SODIUM CHLORIDE 0.9% IV SOLUTION: Freq: Once | INTRAVENOUS | Status: AC

## 2021-01-26 MED ORDER — POTASSIUM CHLORIDE 20 MEQ PO PACK
40.0000 meq | PACK | Freq: Once | ORAL | Status: AC
  Administered 2021-01-26: 40 meq via ORAL
  Filled 2021-01-26: qty 2

## 2021-01-26 MED ORDER — SPIRONOLACTONE 25 MG PO TABS
25.0000 mg | ORAL_TABLET | Freq: Every day | ORAL | Status: DC
  Administered 2021-01-26 – 2021-01-29 (×4): 25 mg via ORAL
  Filled 2021-01-26 (×4): qty 1

## 2021-01-26 MED ORDER — TORSEMIDE 20 MG PO TABS
40.0000 mg | ORAL_TABLET | Freq: Two times a day (BID) | ORAL | Status: DC
  Administered 2021-01-26 – 2021-01-28 (×4): 40 mg via ORAL
  Filled 2021-01-26 (×4): qty 2

## 2021-01-26 MED ORDER — POTASSIUM CHLORIDE 20 MEQ PO PACK
20.0000 meq | PACK | Freq: Once | ORAL | Status: AC
  Administered 2021-01-26: 20 meq via ORAL
  Filled 2021-01-26: qty 1

## 2021-01-26 NOTE — Progress Notes (Signed)
Pt remains confused and only oriented to self and place. There are orders to transfuse blood and no consent has been signed.  I attempted to contact patient's wife at the number listed in his contact information [phone] she did not answer but I did leave message for her to return a call the the department.  She did not answer the number [phone] and the voicemail had not been set up to receive messages.  I attempted to contact his son at [phone] and he did not answer I left a voice mail.  I have notified Dr. Sidney Ace via secure chat and he states it can waiting until family arrives this am to give consent.  Nursing staff to continue to monitor.

## 2021-01-26 NOTE — TOC Initial Note (Signed)
Transition of Care [facility]) - Initial/Assessment Note    Patient Details  Name: [name] MRN: [id number] Date of Birth: 09-Mar-1946  [facility]) CM/SW Contact:    Benard Halsted, LCSW Phone Number: 01/26/2021, 9:33 AM  Clinical Narrative:                 CSW received consult for possible SNF at time of discharge. CSW spoke with patient's son regarding therapy recommendations of SNF. Patient's son reported that patient would like home health services and does not want to go to SNF. He cannot remember what company his mother is currently using, but does not have a preference. CSW discussed equipment needs and he reported patient already has a walker, wheelchair and bedside commode. CSW confirmed PCP and address. No further questions reported at this time and will attempt to locate home health for the patient at discharge.    Expected Discharge Plan: [place] Barriers to Discharge: Continued Medical Work up   Patient Goals and CMS Choice Patient states their goals for this hospitalization and ongoing recovery are:: Return home CMS Medicare.gov Compare Post Acute Care list provided to:: Patient Represenative (must comment) (Son) Choice offered to / list presented to : Adult Children  Expected Discharge Plan and Services Expected Discharge Plan: [place]   Discharge Planning Services: CM Consult Post Acute Care Choice: Home Health Living arrangements for the past 2 months: Single Family Home                 DME Arranged: N/A                    Prior Living Arrangements/Services Living arrangements for the past 2 months: Single Family Home Lives with:: Spouse Patient language and need for interpreter reviewed:: Yes Do you feel safe going back to the place where you live?: Yes      Need for Family Participation in Patient Care: Yes (Comment) Care giver support system in place?: Yes (comment) Current home services: DME  (walker, wc, bsc) Criminal Activity/Legal Involvement Pertinent to Current Situation/Hospitalization: No - Comment as needed  Activities of Daily Living Home Assistive Devices/Equipment: Walker (specify type),Cane (specify quad or straight),Bedside commode/3-in-1,Shower chair with back,Eyeglasses ADL Screening (condition at time of admission) Patient's cognitive ability adequate to safely complete daily activities?: No Is the patient deaf or have difficulty hearing?: Yes Does the patient have difficulty seeing, even when wearing glasses/contacts?: No Does the patient have difficulty concentrating, remembering, or making decisions?: Yes Patient able to express need for assistance with ADLs?: No Does the patient have difficulty dressing or bathing?: Yes Independently performs ADLs?: No Communication: Dependent Is this a change from baseline?: Change from baseline, expected to last <3 days Dressing (OT): Dependent Is this a change from baseline?: Change from baseline, expected to last <3days Grooming: Dependent Is this a change from baseline?: Change from baseline, expected to last <3 days Feeding: Dependent Is this a change from baseline?: Change from baseline, expected to last <3 days Bathing: Dependent Is this a change from baseline?: Change from baseline, expected to last <3 days Toileting: Dependent Is this a change from baseline?: Change from baseline, expected to last <3 days In/Out Bed: Dependent Is this a change from baseline?: Change from baseline, expected to last <3 days Walks in Home: Dependent Is this a change from baseline?: Change from baseline, expected to last <3 days Does the patient have difficulty walking or climbing  stairs?: Yes Weakness of Legs: Both Weakness of Arms/Hands: Both  Permission Sought/Granted Permission sought to share information with : Facility Contact Representative,Family Supports Permission granted to share information with : Yes, Verbal  Permission Granted  Share Information with NAME: [name]  Permission granted to share info w AGENCY: [place] granted to share info w Relationship: Son  Permission granted to share info w Contact Information: [phone]  Emotional Assessment Appearance:: Appears stated age Attitude/Demeanor/Rapport: Unable to Assess Affect (typically observed): Unable to Assess Orientation: : Oriented to Self,Oriented to Place Alcohol / Substance Use: Not Applicable Psych Involvement: No (comment)  Admission diagnosis:  Hepatic encephalopathy ([place]) [K72.90] Altered mental status, unspecified altered mental status type [R41.82] Gastrointestinal hemorrhage, unspecified gastrointestinal hemorrhage type [K92.2] Anemia, unspecified type [D64.9] Patient Active Problem List   Diagnosis Date Noted  . Hepatic encephalopathy ([place]) 01/22/2021  . GI bleeding 09/10/2020  . CKD (chronic kidney disease), stage II   . Hepatocellular carcinoma ([place]) 08/09/2020  . Routine general medical examination at a health care facility 05/06/2020  . Iron deficiency anemia   . Symptomatic anemia 04/15/2020  . Pulmonary hypertension, unspecified ([place]) 03/11/2020  . Asthma, mild intermittent 12/27/2019  . Alcoholic cirrhosis of liver with ascites ([place]) 12/27/2019  . (HFpEF) heart failure with preserved ejection fraction ([place]) 12/24/2018  . CKD (chronic kidney disease), stage III ([place]) 12/18/2017  . Peptic ulcer disease 12/18/2017  . GI bleed 09/29/2017  . Morbid obesity ([place]) 02/05/2016  . S/P CABG x 3 03/31/2014  . Hepatic cirrhosis ([place]) 02/27/2014  . Melena 08/14/2012  . Hyperlipidemia with target LDL less than 70 06/29/2012  . Thrombocytopenia ([place]) 06/29/2012  . Coronary artery disease involving native coronary artery of native heart without angina pectoris   . OSA (obstructive sleep apnea)   . Type 2 diabetes with complication ([place]) 03/50/0938  . GERD without esophagitis 08/04/2010  . History of gout    . Hypertensive heart disease    PCP:  [name], MD Pharmacy:   [facility] [place], [address] AT [address] [place] [address] Phone: [phone] Fax: [phone]     Social Determinants of Health (SDOH) Interventions    Readmission Risk Interventions Readmission Risk Prevention Plan 01/26/2021 08/12/2020 08/12/2020  Transportation Screening Complete - Complete  PCP or Specialist Appt within 5-7 Days - - -  Not Complete comments - - -  PCP or Specialist Appt within 3-5 Days - Complete -  Home Care Screening - - -  Medication Review (RN CM) - - -  [place] or [place] - - Complete  Social Work Consult for [place] Planning/Counseling - - Complete  Palliative Care Screening - - Not Applicable  Medication Review Press photographer) Complete - -  PCP or Specialist appointment within 3-5 days of discharge Complete - -  [place] or [place] Complete - -  SW Recovery Care/Counseling Consult Complete - -  Palliative Care Screening Not Applicable - -  [place] Patient Refused - -  Some recent data might be hidden

## 2021-01-26 NOTE — Progress Notes (Addendum)
CRITICAL VALUE STICKER  CRITICAL VALUE: HGB=6.9  RECEIVER (on-site recipient of call): Manuela Schwartz RN  DATE & TIME NOTIFIED:  01/26/2021 0216  MESSENGER (representative from lab): Holley Dexter  MD NOTIFIED: Dr.Mansy   TIME OF NOTIFICATION: 01/26/2021 0230  RESPONSE: New orders placed in [facility] to be carried out.  Nursing staff to continue to monitor.

## 2021-01-26 NOTE — Plan of Care (Signed)

## 2021-01-26 NOTE — Evaluation (Signed)
Occupational Therapy Evaluation Patient Details Name: [name] MRN: [id number] DOB: Oct 01, 1945 Today's Date: 01/26/2021    History of Present Illness Pt is a 75 y/o male admitted secondary to AMS. Likely secondary to hepatic encephalopathy. Pt with recent admission in early 12/2020 for GI bleed. PMH includes CAD s/p CABG, dCHF, HTN, alcoholic cirrhosis, and hepatocellular carcinoma.   Clinical Impression   Pt PTA: pt living with family; pt is a poor historian and unable to recall PLOF with no family in room at this time. Pt currently, limited by decreased strength, decreased ability to care for self and decreased activity tolerance. Pt appears to be alert and oriented to self and place, but not date or situation.  Pt with cognitive deficits, thus safety is a factor for care and for d/c planning. Pt set-upA to maxA for ADL and supervisionA to modA for stability and transfers. Pt would benefit from continued OT skilled services. OT following acutely. (pt could go home with family if 24/7 physical assist available). Pt's O2 >90% on RA.    Follow Up Recommendations  Home health OT;Supervision/Assistance - 24 hour;SNF    Equipment Recommendations  3 in 1 bedside commode    Recommendations for Other Services       Precautions / Restrictions Precautions Precautions: Fall;Other (comment) Precaution Comments: reports incontinent Restrictions Weight Bearing Restrictions: No      Mobility Bed Mobility Overal bed mobility: Needs Assistance Bed Mobility: Supine to Sit     Supine to sit: Min assist     General bed mobility comments: MinA to elevate trunk with OTR    Transfers Overall transfer level: Needs assistance Equipment used: Rolling walker (2 wheeled) Transfers: Sit to/from Stand Sit to Stand: Mod assist         General transfer comment: Mod A for lift assist and steadying to stand.    Balance Overall balance assessment: Needs assistance Sitting-balance support: No  upper extremity supported;Feet supported Sitting balance-Leahy Scale: Fair     Standing balance support: Bilateral upper extremity supported;During functional activity Standing balance-Leahy Scale: Poor Standing balance comment: Reliant on BUE support                           ADL either performed or assessed with clinical judgement   ADL Overall ADL's : Needs assistance/impaired Eating/Feeding: Set up;Sitting   Grooming: Supervision/safety;Standing   Upper Body Bathing: Minimal assistance;Sitting   Lower Body Bathing: Maximal assistance;Sitting/lateral leans;Sit to/from stand;Cueing for safety;Cueing for sequencing   Upper Body Dressing : Minimal assistance;Sitting   Lower Body Dressing: Maximal assistance;Cueing for safety;Cueing for sequencing;Sitting/lateral leans;Sit to/from stand   Toilet Transfer: Min guard;RW;BSC   Toileting- Water quality scientist and Hygiene: Moderate assistance;Cueing for safety;Sitting/lateral lean;Sit to/from stand       Functional mobility during ADLs: Min guard;Rolling walker;Cueing for safety;Cueing for sequencing General ADL Comments: Pt limited by decreased strength, decreased ability to care for self and decreased activity tolerance. Pt appears to be alert and oriented to self and place, but not date or situation.     Vision Baseline Vision/History: No visual deficits Patient Visual Report: No change from baseline Vision Assessment?: No apparent visual deficits     Perception     Praxis      Pertinent Vitals/Pain Pain Assessment: 0-10 Faces Pain Scale: Hurts little more Pain Location: bil feet Pain Descriptors / Indicators: Aching Pain Intervention(s): Monitored during session     Hand Dominance Right   Extremity/Trunk Assessment Upper  Extremity Assessment Upper Extremity Assessment: Generalized weakness;LUE deficits/detail;RUE deficits/detail RUE Deficits / Details: edema LUE Deficits / Details: edema   Lower  Extremity Assessment Lower Extremity Assessment: Generalized weakness   Cervical / Trunk Assessment Cervical / Trunk Assessment: Kyphotic   Communication Communication Communication: HOH   Cognition Arousal/Alertness: Awake/alert Behavior During Therapy: Flat affect Overall Cognitive Status: No family/caregiver present to determine baseline cognitive functioning                                 General Comments: Disoriented to situation and time. Very flat affect with slow processing noted. Decreased awareness of deficits. Pt improperly using urinal upon entry and urine on floor; did not realize he had not used urinal correctly.   General Comments  O2 >90% on RA.    Exercises     Shoulder Instructions      Home Living Family/patient expects to be discharged to:: Private residence Living Arrangements: Spouse/significant other;Children Available Help at Discharge: Family;Available 24 hours/day Type of Home: House Home Access: Stairs to enter [employer] of Steps: 1 Entrance Stairs-Rails: Left Home Layout: Two level;Able to live on main level with bedroom/bathroom     Bathroom Shower/Tub: Teacher, early years/pre: Standard     Home Equipment: Cane - single point;Walker - 2 wheels;Shower seat;Bedside commode          Prior Functioning/Environment Level of Independence: Needs assistance  Gait / Transfers Assistance Needed: Uses RW for ambulation. ADL's / Homemaking Assistance Needed: Reports wife and son assist with med management. Reports independence with ADLs.   Comments: Unsure of accuracy as no family present        OT Problem List: Decreased strength;Decreased activity tolerance;Impaired balance (sitting and/or standing);Decreased cognition;Decreased safety awareness;Cardiopulmonary status limiting activity;Increased edema      OT Treatment/Interventions: Self-care/ADL training;Therapeutic exercise;Energy conservation;DME  and/or AE instruction;Therapeutic activities;Cognitive remediation/compensation;Patient/family education;Balance training    OT Goals(Current goals can be found in the care plan section) Acute Rehab OT Goals Patient Stated Goal: none stated OT Goal Formulation: Patient unable to participate in goal setting Time For Goal Achievement: 02/09/21 Potential to Achieve Goals: Good ADL Goals Pt Will Perform Grooming: (P) with min guard assist;standing Pt Will Perform Lower Body Dressing: (P) with min guard assist;sit to/from stand Pt Will Transfer to Toilet: (P) with min guard assist;ambulating Additional ADL Goal #1: (P) Pt will state 3 energy conservation techniques to assist with OOB ADL and  mobility Additional ADL Goal #2: (P) Pt will perform x5 mins of OOB ADL with 1 seated rest break in order to increase independence.  OT Frequency: Min 2X/week   Barriers to D/C:            Co-evaluation              AM-PAC OT "6 Clicks" Daily Activity     Outcome Measure Help from another person eating meals?: None Help from another person taking care of personal grooming?: A Little Help from another person toileting, which includes using toliet, bedpan, or urinal?: A Lot Help from another person bathing (including washing, rinsing, drying)?: A Lot Help from another person to put on and taking off regular upper body clothing?: A Little Help from another person to put on and taking off regular lower body clothing?: A Lot 6 Click Score: 16   End of Session Equipment Utilized During Treatment: Gait belt;Rolling walker Nurse Communication: Mobility status  Activity Tolerance:  Patient tolerated treatment well Patient left: in chair;with call bell/phone within reach;with chair alarm set  OT Visit Diagnosis: Unsteadiness on feet (R26.81);Muscle weakness (generalized) (M62.81);Other symptoms and signs involving cognitive function                Time: 1540-0867 OT Time Calculation (min): 26  min Charges:  OT General Charges $OT Visit: 1 Visit OT Evaluation $OT Eval Moderate Complexity: 1 Mod OT Treatments $Self Care/Home Management : 8-22 mins  Jefferey Pica, OTR/L Acute Rehabilitation Services Pager: [phone] Office: [phone]   Wendelin Reader C 01/26/2021, 3:25 PM

## 2021-01-26 NOTE — Progress Notes (Signed)
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  At baseline patient is typically alert and oriented x4 and ambulatory.  Symptoms onset roughly 3 days ago with associated confusion, nausea, chills, weakness and vomiting.  Apparently patient was not taking his lactulose at home.  Patient recently discharged 5 days prior after an 11-day hospitalization due to symptomatic anemia in the setting of recurrent GI bleeding requiring 5 unit PRBC and TIPS procedure with embolization of gastric varices by IR on 01/13/2021.  Assessment & Plan:   Principal Problem:   Hepatic encephalopathy ([place]) Active Problems:   Type 2 diabetes with complication (HCC)   GERD without esophagitis   Hyperlipidemia with target LDL less than 70   CKD (chronic kidney disease), stage III (HCC)   Alcoholic cirrhosis of liver with ascites (HCC)   Hepatocellular carcinoma (HCC)   Acute hepatic encephalopathy secondary to medication noncompliance, POA -Ammonia downtrending appropriately, mental status improving but not yet back to baseline on increased regimen of lactulose and rifaximin -reports episodes of diarrhea which is less likely given his elevated ammonia -Patient admits to noncompliance at bedside again today and lengthy discussion about need for ongoing compliance status post TIPS procedure for gastric varices.  -CT head unremarkable  -Continue increased dose of lactulose 30 g p.o. 3 times daily; rifaximin 550 mg p.o. twice  daily -GI sidelined given ongoing positive FOBT with downtrending hemoglobin, recommending repeat labs and to follow clinically given no indication for endoscopy status post TIPS as any varices should have deflated at this point and be less likely to bleed  Hypokalemia Ongoing, -- Discontinue IV fluids now passed swallow evaluation today --follow electrolytes daily  Normocytic Anemia, acutely worsening overnight Recently hospitalized and status posttransfusion 5 unit PRBCs.  Hemoglobin 7.4 on admission (7.3 on 5/22 at previous discharge).  MCV 82.5.  FOBT positive. --Hgb 7.4>>8.0>7.8>6.9 -2u ordered this am; will transfuse 1, recheck H&H and hold second unit if not indicated given ongoing blood shortage -GI sidelined as above, no acute or current indication for imaging or endoscopy -Protonix 40mg  PO q12h  Hepatic cirrhosis Gastric/esophageal varices Portal gastropathy EGD 5/13 by Dr. Michail Sermon notable for small distal esophagus varices, erosions/erythema gastric antrum, gastric varices within the fundus, mild portal hypertensive gastropathy.  Patient underwent TIPS and embolization of gastric varices by IR on 9/79  Chronic diastolic congestive heart failure TTE with LVEF 89-21%, grade 3 diastolic dysfunction.  On torsemide and spironolactone outpatient -restarted today --Strict I's and O's and daily weights --Monitor renal function closely daily  Lactic acidosis, likely multifactorial Etiology unclear, but suspect from dehydration/hepatic encephalopathy.  No signs of focal infectious process, afebrile without leukocytosis, urinalysis unremarkable, chest x-ray without focal consolidation. Started empirically on antibiotics by admitting physician with vancomycin and cefepime; discontinued as his etiology of lactic acidosis related to his encephalopathy from rather than infectious cause.  CKD stage IIIa - Baseline creatinine 1.2.   -Creatinine within normal limits today unclear if baseline  is truly stage 3A -given GFR is greater than 60 at this point  Type 2 diabetes mellitus On glimepiride and metformin outpatient. --Hold oral hypoglycemics while inpatient --SSI for coverage --CBGs before every meal/at bedtime  HLD: Consider restarting Lipitor tomorrow  Morbid obesity Body mass index is 33.99 kg/m.  Discussed with patient needs for aggressive lifestyle changes/weight loss as this complicates all facets of care. Outpatient follow-up with PCP.   Weakness/deconditioning: - PT/OT evaluation  DVT prophylaxis: SCDs, chemical DVT prophylaxis contraindicated with positive FOBT   Code Status: Full Code Family Communication: No family present at bedside this am  Disposition Plan:  Level of care: Telemetry Medical Status is: Observation  The patient will require care spanning > 2 midnights and should be moved to inpatient because: Altered mental status, Ongoing diagnostic testing needed not appropriate for outpatient work up, Unsafe d/c plan, IV treatments appropriate due to intensity of illness or inability to take PO and Inpatient level of care appropriate due to severity of illness  Dispo: The patient is from: Home              Anticipated d/c is to: To be determined              Patient currently is not medically stable to d/c.   Difficult to place patient No    Consultants:   EDP discussed care with GI, Dr. Cristina Gong  Procedures:   None  Antimicrobials:   Vancomycin 5/27 - 5/28  Cefepime 5/27 - 5/28  Metronidazole 5/27 - 5/27   Subjective: No acute issues or events overnight, working with PT this morning on evaluation, appears quite alert but orientation remains limited - sluggish to respond to answers or physical commands.  Denies nausea vomiting diarrhea constipation headache fevers or chills.  Objective: Vitals:   01/25/21 0739 01/25/21 0755 01/25/21 1208 01/25/21 2115  BP:  (!) 142/61 (!) 148/63 (!) 114/58  Pulse:  86 90 79  Resp:  16 16 19    Temp:  98.5 F (36.9 C) 98 F (36.7 C) 97.6 F (36.4 C)  TempSrc:  Oral Axillary Axillary  SpO2: 99% 99% 99% 98%  Weight:      Height:        Intake/Output Summary (Last 24 hours) at 01/26/2021 0800 Last data filed at 01/25/2021 1300 Gross per 24 hour  Intake 760 ml  Output 300 ml  Net 460 ml   Filed Weights   01/23/21 0955 01/24/21 0457  Weight: 103.4 kg 101.4 kg    Examination:  General: Pleasantly sitting in bedside chair, awake alert orientation limited and sluggish to respond but eventually was A/O x3 HEENT:  Normocephalic atraumatic.  Sclerae nonicteric, noninjected.  Extraocular movements intact bilaterally. Neck:  Without mass or deformity.  Trachea is midline. Lungs:  Clear to auscultate bilaterally without rhonchi, wheeze, or rales. Heart:  Regular rate and rhythm.  Without murmurs, rubs, or gallops. Abdomen:  Soft, nontender, nondistended.  Without guarding or rebound. Extremities: Without cyanosis, clubbing, edema, or obvious deformity. Vascular:  Dorsalis pedis and posterior tibial pulses palpable bilaterally. Skin:  Warm and dry, no erythema, no ulcerations.  Data Reviewed: I have personally reviewed following labs and imaging studies  CBC: Recent Labs  Lab 01/22/21 1558 01/22/21 1808 01/23/21 0500 01/23/21 1057 01/23/21 1645 01/23/21 2222 01/24/21 0033 01/25/21 0036 01/26/21 0107  WBC 8.4  --  12.3*  --   --   --  9.3 9.8 9.1  NEUTROABS 7.7  --   --   --   --   --   --   --   --  HGB 7.4*   < > 7.4*   < > 7.5* 7.6* 8.0* 7.8* 6.9*  HCT 25.2*   < > 26.0*   < > 25.3* 25.8* 27.8* 27.4* 23.4*  MCV 80.3  --  82.5  --   --   --  80.3 83.3 80.7  PLT 108*  --  121*  --   --   --  135* 122* 120*   < > = values in this interval not displayed.   Basic Metabolic Panel: Recent Labs  Lab 01/22/21 1558 01/22/21 1808 01/23/21 0500 01/24/21 0033 01/24/21 0934 01/24/21 2044 01/25/21 0036 01/26/21 0107  NA 142 144 143 146*  --   --  147* 137  K 3.5 3.5  3.6 2.8* 2.8* 3.4* 3.6 3.1*  CL 109  --  112* 115*  --   --  118* 110  CO2 25  --  20* 20*  --   --  21* 20*  GLUCOSE 159*  --  180* 160*  --   --  134* 144*  BUN 15  --  16 21  --   --  24* 21  CREATININE 1.12  --  1.20 1.36*  --   --  1.24 1.00  CALCIUM 9.3  --  8.7* 8.4*  --   --  8.4* 8.3*  MG  --   --   --  1.9  --   --  2.2 2.0   GFR: Estimated Creatinine Clearance: 74.8 mL/min (by C-G formula based on SCr of 1 mg/dL). Liver Function Tests: Recent Labs  Lab 01/22/21 1558 01/23/21 0500 01/24/21 0033 01/25/21 0036 01/26/21 0107  AST 28 37 32 42* 82*  ALT 21 20 19 22  48*  ALKPHOS 170* 132* 113 124 356*  BILITOT 1.9* 1.8* 2.1* 2.0* 1.9*  PROT 6.8 6.1* 6.0* 5.6* 5.3*  ALBUMIN 3.6 3.1* 2.9* 2.7* 2.6*   No results for input(s): LIPASE, AMYLASE in the last 168 hours. Recent Labs  Lab 01/22/21 1610 01/23/21 0350 01/24/21 0033 01/25/21 0730 01/26/21 0107  AMMONIA 69* 153* 83* 16 28   Coagulation Profile: Recent Labs  Lab 01/22/21 1558  INR 1.3*   Cardiac Enzymes: No results for input(s): CKTOTAL, CKMB, CKMBINDEX, TROPONINI in the last 168 hours. BNP (last 3 results) No results for input(s): PROBNP in the last 8760 hours. HbA1C: No results for input(s): HGBA1C in the last 72 hours. CBG: Recent Labs  Lab 01/25/21 0755 01/25/21 1213 01/25/21 1720 01/25/21 2117 01/26/21 0729  GLUCAP 114* 153* 155* 152* 117*   Lipid Profile: No results for input(s): CHOL, HDL, LDLCALC, TRIG, CHOLHDL, LDLDIRECT in the last 72 hours. Thyroid Function Tests: Recent Labs    01/24/21 0934  TSH 0.953   Anemia Panel: No results for input(s): VITAMINB12, FOLATE, FERRITIN, TIBC, IRON, RETICCTPCT in the last 72 hours. Sepsis Labs: Recent Labs  Lab 01/22/21 1758 01/23/21 0350 01/23/21 1057 01/25/21 0036 01/26/21 0107  PROCALCITON  --   --  0.16  --   --   LATICACIDVEN 2.5* 3.2*  --  2.9* 1.9    Recent Results (from the past 240 hour(s))  Blood Culture (routine x 2)      Status: Abnormal   Collection Time: 01/22/21  2:08 PM   Specimen: BLOOD  Result Value Ref Range Status   Specimen Description BLOOD RIGHT ANTECUBITAL  Final   Special Requests   Final    BOTTLES DRAWN AEROBIC AND ANAEROBIC Blood Culture adequate volume   Culture  Setup  Time   Final    GRAM POSITIVE COCCI IN CLUSTERS IN BOTH AEROBIC AND ANAEROBIC BOTTLES CRITICAL RESULT CALLED TO, READ BACK BY AND VERIFIED WITH: Gorden Harms PHARMD @1949  01/23/21 EB    Culture (A)  Final    STAPHYLOCOCCUS EPIDERMIDIS THE SIGNIFICANCE OF ISOLATING THIS ORGANISM FROM A SINGLE SET OF BLOOD CULTURES WHEN MULTIPLE SETS ARE DRAWN IS UNCERTAIN. PLEASE NOTIFY THE MICROBIOLOGY DEPARTMENT WITHIN ONE WEEK IF SPECIATION AND SENSITIVITIES ARE REQUIRED. Performed at [facility], [place] [address], [place], [place] [address]    Report Status 01/25/2021 FINAL  Final  Blood Culture ID Panel (Reflexed)     Status: Abnormal   Collection Time: 01/22/21  2:08 PM  Result Value Ref Range Status   Enterococcus faecalis NOT DETECTED NOT DETECTED Final   Enterococcus Faecium NOT DETECTED NOT DETECTED Final   Listeria monocytogenes NOT DETECTED NOT DETECTED Final   Staphylococcus species DETECTED (A) NOT DETECTED Final    Comment: CRITICAL RESULT CALLED TO, READ BACK BY AND VERIFIED WITH: Gorden Harms PHARMD @1949  01/23/21 EB    Staphylococcus aureus (BCID) NOT DETECTED NOT DETECTED Final   Staphylococcus epidermidis DETECTED (A) NOT DETECTED Final    Comment: Methicillin (oxacillin) resistant coagulase negative staphylococcus. Possible blood culture contaminant (unless isolated from more than one blood culture draw or clinical case suggests pathogenicity). No antibiotic treatment is indicated for blood  culture contaminants. CRITICAL RESULT CALLED TO, READ BACK BY AND VERIFIED WITH: Gorden Harms PHARMD @1949  01/23/21 EB    Staphylococcus lugdunensis NOT DETECTED NOT DETECTED Final   Streptococcus species NOT DETECTED NOT DETECTED  Final   Streptococcus agalactiae NOT DETECTED NOT DETECTED Final   Streptococcus pneumoniae NOT DETECTED NOT DETECTED Final   Streptococcus pyogenes NOT DETECTED NOT DETECTED Final   A.calcoaceticus-baumannii NOT DETECTED NOT DETECTED Final   Bacteroides fragilis NOT DETECTED NOT DETECTED Final   Enterobacterales NOT DETECTED NOT DETECTED Final   Enterobacter cloacae complex NOT DETECTED NOT DETECTED Final   Escherichia coli NOT DETECTED NOT DETECTED Final   Klebsiella aerogenes NOT DETECTED NOT DETECTED Final   Klebsiella oxytoca NOT DETECTED NOT DETECTED Final   Klebsiella pneumoniae NOT DETECTED NOT DETECTED Final   Proteus species NOT DETECTED NOT DETECTED Final   Salmonella species NOT DETECTED NOT DETECTED Final   Serratia marcescens NOT DETECTED NOT DETECTED Final   Haemophilus influenzae NOT DETECTED NOT DETECTED Final   Neisseria meningitidis NOT DETECTED NOT DETECTED Final   Pseudomonas aeruginosa NOT DETECTED NOT DETECTED Final   Stenotrophomonas maltophilia NOT DETECTED NOT DETECTED Final   Candida albicans NOT DETECTED NOT DETECTED Final   Candida auris NOT DETECTED NOT DETECTED Final   Candida glabrata NOT DETECTED NOT DETECTED Final   Candida krusei NOT DETECTED NOT DETECTED Final   Candida parapsilosis NOT DETECTED NOT DETECTED Final   Candida tropicalis NOT DETECTED NOT DETECTED Final   Cryptococcus neoformans/gattii NOT DETECTED NOT DETECTED Final   Methicillin resistance mecA/C DETECTED (A) NOT DETECTED Final    Comment: CRITICAL RESULT CALLED TO, READ BACK BY AND VERIFIED WITH: Gorden Harms PHARMD @1949  01/23/21 EB Performed at [facility], [address] [place], [place] [address]   Blood Culture (routine x 2)     Status: None (Preliminary result)   Collection Time: 01/22/21  4:03 PM   Specimen: BLOOD LEFT WRIST  Result Value Ref Range Status   Specimen Description BLOOD LEFT WRIST  Final   Special Requests   Final    BOTTLES  DRAWN AEROBIC AND ANAEROBIC  Blood Culture adequate volume   Culture   Final    NO GROWTH 3 DAYS Performed at [facility], [place] [address], [place], [place] [address]    Report Status PENDING  Incomplete  Resp Panel by RT-PCR (Flu A&B, Covid) Nasopharyngeal Swab     Status: None   Collection Time: 01/22/21  4:50 PM   Specimen: Nasopharyngeal Swab; Nasopharyngeal(NP) swabs in vial transport medium  Result Value Ref Range Status   SARS Coronavirus 2 by RT PCR NEGATIVE NEGATIVE Final    Comment: (NOTE) SARS-CoV-2 target nucleic acids are NOT DETECTED.  The SARS-CoV-2 RNA is generally detectable in upper respiratory specimens during the acute phase of infection. The lowest concentration of SARS-CoV-2 viral copies this assay can detect is 138 copies/mL. A negative result does not preclude SARS-Cov-2 infection and should not be used as the sole basis for treatment or other patient management decisions. A negative result may occur with  improper specimen collection/handling, submission of specimen other than nasopharyngeal swab, presence of viral mutation(s) within the areas targeted by this assay, and inadequate number of viral copies(<138 copies/mL). A negative result must be combined with clinical observations, patient history, and epidemiological information. The expected result is Negative.  Fact Sheet for Patients:  EntrepreneurPulse.com.au  Fact Sheet for Healthcare Providers:  IncredibleEmployment.be  This test is no t yet approved or cleared by the [place] FDA and  has been authorized for detection and/or diagnosis of SARS-CoV-2 by FDA under an Emergency Use Authorization (EUA). This EUA will remain  in effect (meaning this test can be used) for the duration of the COVID-19 declaration under Section 564(b)(1) of the Act, 21 U.S.C.section 360bbb-3(b)(1), unless the authorization is terminated  or revoked sooner.       Influenza A by PCR NEGATIVE NEGATIVE  Final   Influenza B by PCR NEGATIVE NEGATIVE Final    Comment: (NOTE) The Xpert Xpress SARS-CoV-2/FLU/RSV plus assay is intended as an aid in the diagnosis of influenza from Nasopharyngeal swab specimens and should not be used as a sole basis for treatment. Nasal washings and aspirates are unacceptable for Xpert Xpress SARS-CoV-2/FLU/RSV testing.  Fact Sheet for Patients: EntrepreneurPulse.com.au  Fact Sheet for Healthcare Providers: IncredibleEmployment.be  This test is not yet approved or cleared by the [place] FDA and has been authorized for detection and/or diagnosis of SARS-CoV-2 by FDA under an Emergency Use Authorization (EUA). This EUA will remain in effect (meaning this test can be used) for the duration of the COVID-19 declaration under Section 564(b)(1) of the Act, 21 U.S.C. section 360bbb-3(b)(1), unless the authorization is terminated or revoked.  Performed at [facility], [place] [address], [place], [place] [address]   MRSA PCR Screening     Status: None   Collection Time: 01/24/21  5:04 AM   Specimen: Nasal Mucosa; Nasopharyngeal  Result Value Ref Range Status   MRSA by PCR NEGATIVE NEGATIVE Final    Comment:        The GeneXpert MRSA Assay (FDA approved for NASAL specimens only), is one component of a comprehensive MRSA colonization surveillance program. It is not intended to diagnose MRSA infection nor to guide or monitor treatment for MRSA infections. Performed at [facility], [place] [address] [place],  [address]          Radiology Studies: No results found.  Scheduled Meds: . sodium chloride   Intravenous Once  . chlorhexidine  15 mL Mouth Rinse  BID  . insulin aspart  0-5 Units Subcutaneous QHS  . insulin aspart  0-9 Units Subcutaneous TID WC  . lactulose  30 g Oral TID  . mouth rinse  15 mL Mouth Rinse q12n4p  . pantoprazole  40 mg Oral BID  . rifaximin  550 mg Oral BID  .  umeclidinium-vilanterol  1 puff Inhalation Daily   Continuous Infusions: . sodium chloride       LOS: 3 days   Time spent: 32min  Rosea Dory C Nasir Bright, DO [facility] Available via Epic secure chat 7am-7pm After these hours, please refer to coverage provider listed on [email or web address] 01/26/2021, 8:00 AM

## 2021-01-26 NOTE — Progress Notes (Signed)
Hospital

## 2021-01-27 ENCOUNTER — Inpatient Hospital Stay (HOSPITAL_COMMUNITY): Payer: Medicare HMO

## 2021-01-27 DIAGNOSIS — K7290 Hepatic failure, unspecified without coma: Secondary | ICD-10-CM

## 2021-01-27 DIAGNOSIS — K7031 Alcoholic cirrhosis of liver with ascites: Secondary | ICD-10-CM

## 2021-01-27 DIAGNOSIS — D649 Anemia, unspecified: Secondary | ICD-10-CM

## 2021-01-27 DIAGNOSIS — R4182 Altered mental status, unspecified: Secondary | ICD-10-CM

## 2021-01-27 DIAGNOSIS — R509 Fever, unspecified: Secondary | ICD-10-CM

## 2021-01-27 LAB — BASIC METABOLIC PANEL
Anion gap: 11 (ref 5–15)
BUN: 17 mg/dL (ref 8–23)
CO2: 18 mmol/L — ABNORMAL LOW (ref 22–32)
Calcium: 8.2 mg/dL — ABNORMAL LOW (ref 8.9–10.3)
Chloride: 108 mmol/L (ref 98–111)
Creatinine, Ser: 1.01 mg/dL (ref 0.61–1.24)
GFR, Estimated: >60 mL/min (ref >60)
Glucose, Bld: 135 mg/dL — ABNORMAL HIGH (ref 70–99)
Potassium: 3.6 mmol/L (ref 3.5–5.1)
Sodium: 137 mmol/L (ref 135–145)

## 2021-01-27 LAB — GLUCOSE, CAPILLARY
Glucose-Capillary: 121 mg/dL — ABNORMAL HIGH (ref 70–99)
Glucose-Capillary: 173 mg/dL — ABNORMAL HIGH (ref 70–99)
Glucose-Capillary: 143 mg/dL — ABNORMAL HIGH (ref 70–99)
Glucose-Capillary: 142 mg/dL — ABNORMAL HIGH (ref 70–99)

## 2021-01-27 LAB — CBC
HCT: 27.1 % — ABNORMAL LOW (ref 39.0–52.0)
HCT: 26.1 % — ABNORMAL LOW (ref 39.0–52.0)
Hemoglobin: 8.3 g/dL — ABNORMAL LOW (ref 13.0–17.0)
Hemoglobin: 7.9 g/dL — ABNORMAL LOW (ref 13.0–17.0)
MCH: 24.7 pg — ABNORMAL LOW (ref 26.0–34.0)
MCH: 24.2 pg — ABNORMAL LOW (ref 26.0–34.0)
MCHC: 30.6 g/dL (ref 30.0–36.0)
MCHC: 30.3 g/dL (ref 30.0–36.0)
MCV: 80.7 fL (ref 80.0–100.0)
MCV: 80.1 fL (ref 80.0–100.0)
Platelets: 132 10*3/uL — ABNORMAL LOW (ref 150–400)
Platelets: 105 10*3/uL — ABNORMAL LOW (ref 150–400)
RBC: 3.36 MIL/uL — ABNORMAL LOW (ref 4.22–5.81)
RBC: 3.26 MIL/uL — ABNORMAL LOW (ref 4.22–5.81)
RDW: 21 % — ABNORMAL HIGH (ref 11.5–15.5)
RDW: 21.7 % — ABNORMAL HIGH (ref 11.5–15.5)
WBC: 9.3 10*3/uL (ref 4.0–10.5)
WBC: 17.3 10*3/uL — ABNORMAL HIGH (ref 4.0–10.5)
nRBC: 0.2 % (ref 0.0–0.2)
nRBC: 0.2 % (ref 0.0–0.2)

## 2021-01-27 LAB — CULTURE, BLOOD (ROUTINE X 2)
Culture: NO GROWTH
Special Requests: ADEQUATE

## 2021-01-27 LAB — PROCALCITONIN: Procalcitonin: 2.17 ng/mL

## 2021-01-27 MED ORDER — ACETAMINOPHEN 325 MG PO TABS
650.0000 mg | ORAL_TABLET | ORAL | Status: DC | PRN
  Administered 2021-01-27: 650 mg via ORAL
  Filled 2021-01-27: qty 2

## 2021-01-27 MED ORDER — SODIUM CHLORIDE 0.9 % IV SOLN
2.0000 g | INTRAVENOUS | Status: DC
  Administered 2021-01-27 – 2021-01-29 (×3): 2 g via INTRAVENOUS
  Filled 2021-01-27 (×3): qty 20

## 2021-01-27 MED ORDER — POTASSIUM CHLORIDE 20 MEQ PO PACK
40.0000 meq | PACK | Freq: Once | ORAL | Status: AC
  Administered 2021-01-27: 40 meq via ORAL
  Filled 2021-01-27: qty 2

## 2021-01-27 NOTE — Progress Notes (Signed)
   01/27/21 0639  Vitals  Temp (!) 100.9 F (38.3 C)  Temp Source Oral  BP (!) 144/60  MAP (mmHg) 85  BP Location Left Arm  BP Method Automatic  Patient Position (if appropriate) Lying  Pulse Rate 94  Pulse Rate Source Monitor  Resp (!) 21  Level of Consciousness  Level of Consciousness Alert  MEWS COLOR  MEWS Score Color Yellow  Oxygen Therapy  SpO2 99 %  O2 Device Room Air  MEWS Score  MEWS Temp 1  MEWS Systolic 0  MEWS Pulse 0  MEWS RR 1  MEWS LOC 0  MEWS Score 2  Provider Notification  Provider Name/Title Argie Ramming, MD  Date Provider Notified 01/27/21  Time Provider Notified [phone]  Notification Type Page  Yellow MEWS initiated. CN and MD notified.

## 2021-01-27 NOTE — Progress Notes (Signed)
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  At baseline patient is typically alert and oriented x4 and ambulatory.  Symptoms onset roughly 3 days ago with associated confusion, nausea, chills, weakness and vomiting.  Apparently patient was not taking his lactulose at home.  Patient recently discharged 5 days prior after an 11-day hospitalization due to symptomatic anemia in the setting of recurrent GI bleeding requiring 5 unit PRBC and TIPS procedure with embolization of gastric varices by IR on 01/13/2021.  Subjective:  Patient with multiple episodes of diarrhea yesterday, they held his lactulose overnight, patient with fever 100.9 overnight.  Assessment & Plan:   Principal Problem:   Hepatic encephalopathy ([place]) Active Problems:   Type 2 diabetes with complication (HCC)   GERD without esophagitis   Hyperlipidemia with target LDL less than 70   CKD (chronic kidney disease), stage III (HCC)   Alcoholic cirrhosis of liver with ascites (HCC)   Hepatocellular carcinoma (HCC)   Acute hepatic encephalopathy secondary to medication noncompliance, POA -Ammonia downtrending appropriately, mental status improving but not yet back to baseline on increased regimen of lactulose and rifaximin, he is confused and altered this am. -reports episodes of diarrhea which is less likely given his elevated ammonia -Patient admits to noncompliance at bedside again today and lengthy discussion about need for  ongoing compliance status post TIPS procedure for gastric varices.   -CT head unremarkable  -Continue increased dose of lactulose 30 g p.o. 3 times daily; rifaximin 550 mg p.o. twice daily -GI sidelined given ongoing positive FOBT with downtrending hemoglobin, recommending repeat labs and to follow clinically given no indication for endoscopy status post TIPS as any varices should have deflated at this point and be less likely to bleed, hemoglobin stable today at 8.3, will monitor closely -Patient with fever 100.9 overnight, will start empirically on Rocephin, will check UA, blood cultures and chest x-ray.  Hypokalemia Ongoing, -- Discontinue IV fluids now passed swallow evaluation today --follow electrolytes daily  Normocytic Anemia, acutely worsening overnight Recently hospitalized and status posttransfusion 5 unit PRBCs.  Hemoglobin 7.4 on admission (7.3 on 5/22 at previous discharge).  MCV 82.5.  FOBT positive. --Hgb 7.4>>8.0>7.8>6.9 -2u ordered this am; will transfuse 1, recheck H&H and hold second unit if not indicated given ongoing blood shortage -GI sidelined as above, no acute or current indication for imaging or endoscopy -Protonix 40mg  PO q12h  Hepatic cirrhosis Gastric/esophageal varices Portal gastropathy EGD 5/13 by Dr. Michail Sermon notable for small distal esophagus varices, erosions/erythema gastric antrum, gastric varices within the fundus, mild portal hypertensive gastropathy.  Patient underwent TIPS and embolization of gastric varices by IR on 8/30  Chronic diastolic congestive heart failure TTE with LVEF 94-07%, grade 3 diastolic dysfunction.  On torsemide and spironolactone outpatient -restarted today --Strict I's and O's and daily weights --Monitor renal function closely daily  Lactic acidosis, likely multifactorial Etiology unclear, but suspect from dehydration/hepatic encephalopathy.  No signs of focal infectious process, afebrile without leukocytosis, urinalysis  unremarkable, chest x-ray without focal consolidation. Started empirically on  antibiotics by admitting physician with vancomycin and cefepime; discontinued as his etiology of lactic acidosis related to his encephalopathy from rather than infectious cause.  CKD stage IIIa - Baseline creatinine 1.2.   -Creatinine within normal limits today unclear if baseline is truly stage 3A -given GFR is greater than 60 at this point  Type 2 diabetes mellitus On glimepiride and metformin outpatient. --Hold oral hypoglycemics while inpatient --SSI for coverage --CBGs before every meal/at bedtime  HLD: Consider restarting Lipitor tomorrow  Morbid obesity Body mass index is 33.99 kg/m.  Discussed with patient needs for aggressive lifestyle changes/weight loss as this complicates all facets of care. Outpatient follow-up with PCP.   Weakness/deconditioning: - PT/OT evaluation  DVT prophylaxis: SCDs, chemical DVT prophylaxis contraindicated with positive FOBT   Code Status: Full Code Family Communication: No family present at bedside this am  Disposition Plan:  Level of care: Telemetry Medical Status is: Observation  The patient will require care spanning > 2 midnights and should be moved to inpatient because: Altered mental status, Ongoing diagnostic testing needed not appropriate for outpatient work up, Unsafe d/c plan, IV treatments appropriate due to intensity of illness or inability to take PO and Inpatient level of care appropriate due to severity of illness  Dispo: The patient is from: Home              Anticipated d/c is to: To be determined              Patient currently is not medically stable to d/c.   Difficult to place patient No    Consultants:   EDP discussed care with GI, Dr. Cristina Gong  Procedures:   None  Antimicrobials:   Vancomycin 5/27 - 5/28  Cefepime 5/27 - 5/28  Metronidazole 5/27 - 5/27    Objective: Vitals:   01/27/21 0639 01/27/21 0804 01/27/21 1028  01/27/21 1442  BP: (!) 144/60 (!) 123/52 (!) 132/51 (!) 99/47  Pulse: 94 92 92 85  Resp: (!) 21 20 (!) 22 20  Temp: (!) 100.9 F (38.3 C) 99.3 F (37.4 C) 97.7 F (36.5 C) 98.2 F (36.8 C)  TempSrc: Oral Oral Oral Oral  SpO2: 99% 98% 100%   Weight:      Height:       No intake or output data in the 24 hours ending 01/27/21 1520 Filed Weights   01/23/21 0955 01/24/21 0457  Weight: 103.4 kg 101.4 kg    Examination:  Patient is more sleepy today, confused, open eyes to verbal stimuli, but closes eye after that. Symmetrical Chest wall movement, Good air movement bilaterally, CTAB RRR,No Gallops,Rubs or new Murmurs, No Parasternal Heave +ve B.Sounds, Abd Soft, No tenderness, No rebound - guarding or rigidity. No Cyanosis, Clubbing ,+2 edema, No new Rash or bruise    Data Reviewed: I have personally reviewed following labs and imaging studies  CBC: Recent Labs  Lab 01/22/21 1558 01/22/21 1808 01/23/21 0500 01/23/21 1057 01/24/21 0033 01/25/21 0036 01/26/21 0107 01/26/21 1919 01/27/21 0045  WBC 8.4  --  12.3*  --  9.3 9.8 9.1  --  9.3  NEUTROABS 7.7  --   --   --   --   --   --   --   --   HGB 7.4*   < > 7.4*   < > 8.0* 7.8* 6.9* 8.0* 8.3*  HCT 25.2*   < > 26.0*   < > 27.8* 27.4* 23.4* 26.8* 27.1*  MCV 80.3  --  82.5  --  80.3 83.3 80.7  --  80.7  PLT 108*  --  121*  --  135* 122* 120*  --  132*   < > = values in this interval not displayed.   Basic Metabolic Panel: Recent Labs  Lab 01/23/21 0500 01/24/21 0033 01/24/21 0934 01/24/21 2044 01/25/21 0036 01/26/21 0107 01/27/21 0045  NA 143 146*  --   --  147* 137 137  K 3.6 2.8* 2.8* 3.4* 3.6 3.1* 3.6  CL 112* 115*  --   --  118* 110 108  CO2 20* 20*  --   --  21* 20* 18*  GLUCOSE 180* 160*  --   --  134* 144* 135*  BUN 16 21  --   --  24* 21 17  CREATININE 1.20 1.36*  --   --  1.24 1.00 1.01  CALCIUM 8.7* 8.4*  --   --  8.4* 8.3* 8.2*  MG  --  1.9  --   --  2.2 2.0  --    GFR: Estimated Creatinine  Clearance: 74.1 mL/min (by C-G formula based on SCr of 1.01 mg/dL). Liver Function Tests: Recent Labs  Lab 01/22/21 1558 01/23/21 0500 01/24/21 0033 01/25/21 0036 01/26/21 0107  AST 28 37 32 42* 82*  ALT 21 20 19 22  48*  ALKPHOS 170* 132* 113 124 356*  BILITOT 1.9* 1.8* 2.1* 2.0* 1.9*  PROT 6.8 6.1* 6.0* 5.6* 5.3*  ALBUMIN 3.6 3.1* 2.9* 2.7* 2.6*   No results for input(s): LIPASE, AMYLASE in the last 168 hours. Recent Labs  Lab 01/22/21 1610 01/23/21 0350 01/24/21 0033 01/25/21 0730 01/26/21 0107  AMMONIA 69* 153* 83* 16 28   Coagulation Profile: Recent Labs  Lab 01/22/21 1558  INR 1.3*   Cardiac Enzymes: No results for input(s): CKTOTAL, CKMB, CKMBINDEX, TROPONINI in the last 168 hours. BNP (last 3 results) No results for input(s): PROBNP in the last 8760 hours. HbA1C: No results for input(s): HGBA1C in the last 72 hours. CBG: Recent Labs  Lab 01/26/21 1126 01/26/21 1630 01/26/21 2121 01/27/21 0752 01/27/21 1231  GLUCAP 125* 132* 117* 121* 173*   Lipid Profile: No results for input(s): CHOL, HDL, LDLCALC, TRIG, CHOLHDL, LDLDIRECT in the last 72 hours. Thyroid Function Tests: No results for input(s): TSH, T4TOTAL, FREET4, T3FREE, THYROIDAB in the last 72 hours. Anemia Panel: No results for input(s): VITAMINB12, FOLATE, FERRITIN, TIBC, IRON, RETICCTPCT in the last 72 hours. Sepsis Labs: Recent Labs  Lab 01/22/21 1758 01/23/21 0350 01/23/21 1057 01/25/21 0036 01/26/21 0107  PROCALCITON  --   --  0.16  --   --   LATICACIDVEN 2.5* 3.2*  --  2.9* 1.9    Recent Results (from the past 240 hour(s))  Blood Culture (routine x 2)     Status: Abnormal   Collection Time: 01/22/21  2:08 PM   Specimen: BLOOD  Result Value Ref Range Status   Specimen Description BLOOD RIGHT ANTECUBITAL  Final   Special Requests   Final    BOTTLES DRAWN AEROBIC AND ANAEROBIC Blood Culture adequate volume   Culture  Setup Time   Final    GRAM POSITIVE COCCI IN CLUSTERS IN  BOTH AEROBIC AND ANAEROBIC BOTTLES CRITICAL RESULT CALLED TO, READ BACK BY AND VERIFIED WITH: Gorden Harms PHARMD @1949  01/23/21 EB    Culture (A)  Final    STAPHYLOCOCCUS EPIDERMIDIS THE SIGNIFICANCE OF ISOLATING THIS ORGANISM FROM A SINGLE SET OF BLOOD CULTURES WHEN MULTIPLE SETS ARE DRAWN IS UNCERTAIN. PLEASE NOTIFY THE MICROBIOLOGY  DEPARTMENT WITHIN ONE WEEK IF SPECIATION AND SENSITIVITIES ARE REQUIRED. Performed at [facility], [place] [address], [place], [place] [address]    Report Status 01/25/2021 FINAL  Final  Blood Culture ID Panel (Reflexed)     Status: Abnormal   Collection Time: 01/22/21  2:08 PM  Result Value Ref Range Status   Enterococcus faecalis NOT DETECTED NOT DETECTED Final   Enterococcus Faecium NOT DETECTED NOT DETECTED Final   Listeria monocytogenes NOT DETECTED NOT DETECTED Final   Staphylococcus species DETECTED (A) NOT DETECTED Final    Comment: CRITICAL RESULT CALLED TO, READ BACK BY AND VERIFIED WITH: Gorden Harms PHARMD @1949  01/23/21 EB    Staphylococcus aureus (BCID) NOT DETECTED NOT DETECTED Final   Staphylococcus epidermidis DETECTED (A) NOT DETECTED Final    Comment: Methicillin (oxacillin) resistant coagulase negative staphylococcus. Possible blood culture contaminant (unless isolated from more than one blood culture draw or clinical case suggests pathogenicity). No antibiotic treatment is indicated for blood  culture contaminants. CRITICAL RESULT CALLED TO, READ BACK BY AND VERIFIED WITH: Gorden Harms PHARMD @1949  01/23/21 EB    Staphylococcus lugdunensis NOT DETECTED NOT DETECTED Final   Streptococcus species NOT DETECTED NOT DETECTED Final   Streptococcus agalactiae NOT DETECTED NOT DETECTED Final   Streptococcus pneumoniae NOT DETECTED NOT DETECTED Final   Streptococcus pyogenes NOT DETECTED NOT DETECTED Final   A.calcoaceticus-baumannii NOT DETECTED NOT DETECTED Final   Bacteroides fragilis NOT DETECTED NOT DETECTED Final   Enterobacterales NOT  DETECTED NOT DETECTED Final   Enterobacter cloacae complex NOT DETECTED NOT DETECTED Final   Escherichia coli NOT DETECTED NOT DETECTED Final   Klebsiella aerogenes NOT DETECTED NOT DETECTED Final   Klebsiella oxytoca NOT DETECTED NOT DETECTED Final   Klebsiella pneumoniae NOT DETECTED NOT DETECTED Final   Proteus species NOT DETECTED NOT DETECTED Final   Salmonella species NOT DETECTED NOT DETECTED Final   Serratia marcescens NOT DETECTED NOT DETECTED Final   Haemophilus influenzae NOT DETECTED NOT DETECTED Final   Neisseria meningitidis NOT DETECTED NOT DETECTED Final   Pseudomonas aeruginosa NOT DETECTED NOT DETECTED Final   Stenotrophomonas maltophilia NOT DETECTED NOT DETECTED Final   Candida albicans NOT DETECTED NOT DETECTED Final   Candida auris NOT DETECTED NOT DETECTED Final   Candida glabrata NOT DETECTED NOT DETECTED Final   Candida krusei NOT DETECTED NOT DETECTED Final   Candida parapsilosis NOT DETECTED NOT DETECTED Final   Candida tropicalis NOT DETECTED NOT DETECTED Final   Cryptococcus neoformans/gattii NOT DETECTED NOT DETECTED Final   Methicillin resistance mecA/C DETECTED (A) NOT DETECTED Final    Comment: CRITICAL RESULT CALLED TO, READ BACK BY AND VERIFIED WITH: Gorden Harms PHARMD @1949  01/23/21 EB Performed at [facility], [address], [place], [place] [address]   Urine culture     Status: None   Collection Time: 01/22/21  3:58 PM   Specimen: In/Out Cath Urine  Result Value Ref Range Status   Specimen Description IN/OUT CATH URINE  Final   Special Requests NONE  Final   Culture   Final    NO GROWTH Performed at [facility], [place] [address], [place], [place] [address]    Report Status 01/26/2021 FINAL  Final  Blood Culture (routine x 2)     Status: None   Collection Time: 01/22/21  4:03 PM   Specimen: BLOOD LEFT WRIST  Result Value Ref Range Status   Specimen Description BLOOD LEFT WRIST  Final   Special Requests  Final    BOTTLES DRAWN  AEROBIC AND ANAEROBIC Blood Culture adequate volume   Culture   Final    NO GROWTH 5 DAYS Performed at [facility], [place] [address] [place], [place] [address]    Report Status 01/27/2021 FINAL  Final  Resp Panel by RT-PCR (Flu A&B, Covid) Nasopharyngeal Swab     Status: None   Collection Time: 01/22/21  4:50 PM   Specimen: Nasopharyngeal Swab; Nasopharyngeal(NP) swabs in vial transport medium  Result Value Ref Range Status   SARS Coronavirus 2 by RT PCR NEGATIVE NEGATIVE Final    Comment: (NOTE) SARS-CoV-2 target nucleic acids are NOT DETECTED.  The SARS-CoV-2 RNA is generally detectable in upper respiratory specimens during the acute phase of infection. The lowest concentration of SARS-CoV-2 viral copies this assay can detect is 138 copies/mL. A negative result does not preclude SARS-Cov-2 infection and should not be used as the sole basis for treatment or other patient management decisions. A negative result may occur with  improper specimen collection/handling, submission of specimen other than nasopharyngeal swab, presence of viral mutation(s) within the areas targeted by this assay, and inadequate number of viral copies(<138 copies/mL). A negative result must be combined with clinical observations, patient history, and epidemiological information. The expected result is Negative.  Fact Sheet for Patients:  EntrepreneurPulse.com.au  Fact Sheet for Healthcare Providers:  IncredibleEmployment.be  This test is no t yet approved or cleared by the [place] FDA and  has been authorized for detection and/or diagnosis of SARS-CoV-2 by FDA under an Emergency Use Authorization (EUA). This EUA will remain  in effect (meaning this test can be used) for the duration of the COVID-19 declaration under Section 564(b)(1) of the Act, 21 U.S.C.section 360bbb-3(b)(1), unless the authorization is terminated  or revoked sooner.       Influenza A  by PCR NEGATIVE NEGATIVE Final   Influenza B by PCR NEGATIVE NEGATIVE Final    Comment: (NOTE) The Xpert Xpress SARS-CoV-2/FLU/RSV plus assay is intended as an aid in the diagnosis of influenza from Nasopharyngeal swab specimens and should not be used as a sole basis for treatment. Nasal washings and aspirates are unacceptable for Xpert Xpress SARS-CoV-2/FLU/RSV testing.  Fact Sheet for Patients: EntrepreneurPulse.com.au  Fact Sheet for Healthcare Providers: IncredibleEmployment.be  This test is not yet approved or cleared by the [place] FDA and has been authorized for detection and/or diagnosis of SARS-CoV-2 by FDA under an Emergency Use Authorization (EUA). This EUA will remain in effect (meaning this test can be used) for the duration of the COVID-19 declaration under Section 564(b)(1) of the Act, 21 U.S.C. section 360bbb-3(b)(1), unless the authorization is terminated or revoked.  Performed at [facility], [place] [address], [place], [place] [address]   MRSA PCR Screening     Status: None   Collection Time: 01/24/21  5:04 AM   Specimen: Nasal Mucosa; Nasopharyngeal  Result Value Ref Range Status   MRSA by PCR NEGATIVE NEGATIVE Final    Comment:        The GeneXpert MRSA Assay (FDA approved for NASAL specimens only), is one component of a comprehensive MRSA colonization surveillance program. It is not intended to diagnose MRSA infection nor to guide or monitor treatment for MRSA infections. Performed at [facility], [place] [address] [place], [place] [address]          Radiology Studies: No results found.  Scheduled Meds: . chlorhexidine  15 mL Mouth Rinse BID  .  insulin aspart  0-5 Units Subcutaneous QHS  . insulin aspart  0-9 Units Subcutaneous TID WC  . lactulose  30 g Oral TID  . mouth rinse  15 mL Mouth Rinse q12n4p  . pantoprazole  40 mg Oral BID  . rifaximin  550 mg Oral BID  . spironolactone  25  mg Oral Daily  . torsemide  40 mg Oral BID  . umeclidinium-vilanterol  1 puff Inhalation Daily   Continuous Infusions: . sodium chloride       LOS: 4 days    Phillips Climes, MD [facility] Available via Epic secure chat 7am-7pm After these hours, please refer to coverage provider listed on [email or web address] 01/27/2021, 3:20 PM

## 2021-01-27 NOTE — Plan of Care (Signed)

## 2021-01-28 ENCOUNTER — Inpatient Hospital Stay (HOSPITAL_COMMUNITY): Payer: Medicare HMO

## 2021-01-28 LAB — PROCALCITONIN: Procalcitonin: 2.48 ng/mL

## 2021-01-28 LAB — COMPREHENSIVE METABOLIC PANEL
ALT: 46 U/L — ABNORMAL HIGH (ref 0–44)
AST: 57 U/L — ABNORMAL HIGH (ref 15–41)
Albumin: 2.4 g/dL — ABNORMAL LOW (ref 3.5–5.0)
Alkaline Phosphatase: 415 U/L — ABNORMAL HIGH (ref 38–126)
Anion gap: 6 (ref 5–15)
BUN: 21 mg/dL (ref 8–23)
CO2: 21 mmol/L — ABNORMAL LOW (ref 22–32)
Calcium: 7.8 mg/dL — ABNORMAL LOW (ref 8.9–10.3)
Chloride: 108 mmol/L (ref 98–111)
Creatinine, Ser: 1.39 mg/dL — ABNORMAL HIGH (ref 0.61–1.24)
GFR, Estimated: 53 mL/min — ABNORMAL LOW (ref >60)
Glucose, Bld: 121 mg/dL — ABNORMAL HIGH (ref 70–99)
Potassium: 3.3 mmol/L — ABNORMAL LOW (ref 3.5–5.1)
Sodium: 135 mmol/L (ref 135–145)
Total Bilirubin: 1.8 mg/dL — ABNORMAL HIGH (ref 0.3–1.2)
Total Protein: 5.3 g/dL — ABNORMAL LOW (ref 6.5–8.1)

## 2021-01-28 LAB — CBC
HCT: 28.9 % — ABNORMAL LOW (ref 39.0–52.0)
HCT: 25 % — ABNORMAL LOW (ref 39.0–52.0)
Hemoglobin: 8.8 g/dL — ABNORMAL LOW (ref 13.0–17.0)
Hemoglobin: 7.7 g/dL — ABNORMAL LOW (ref 13.0–17.0)
MCH: 24.3 pg — ABNORMAL LOW (ref 26.0–34.0)
MCH: 24.4 pg — ABNORMAL LOW (ref 26.0–34.0)
MCHC: 30.4 g/dL (ref 30.0–36.0)
MCHC: 30.8 g/dL (ref 30.0–36.0)
MCV: 79.8 fL — ABNORMAL LOW (ref 80.0–100.0)
MCV: 79.1 fL — ABNORMAL LOW (ref 80.0–100.0)
Platelets: 62 10*3/uL — ABNORMAL LOW (ref 150–400)
Platelets: 104 10*3/uL — ABNORMAL LOW (ref 150–400)
RBC: 3.62 MIL/uL — ABNORMAL LOW (ref 4.22–5.81)
RBC: 3.16 MIL/uL — ABNORMAL LOW (ref 4.22–5.81)
RDW: 21.3 % — ABNORMAL HIGH (ref 11.5–15.5)
RDW: 21.5 % — ABNORMAL HIGH (ref 11.5–15.5)
WBC: 13.7 10*3/uL — ABNORMAL HIGH (ref 4.0–10.5)
WBC: 11.9 10*3/uL — ABNORMAL HIGH (ref 4.0–10.5)
nRBC: 0.2 % (ref 0.0–0.2)
nRBC: 0.3 % — ABNORMAL HIGH (ref 0.0–0.2)

## 2021-01-28 LAB — GLUCOSE, CAPILLARY
Glucose-Capillary: 108 mg/dL — ABNORMAL HIGH (ref 70–99)
Glucose-Capillary: 139 mg/dL — ABNORMAL HIGH (ref 70–99)
Glucose-Capillary: 143 mg/dL — ABNORMAL HIGH (ref 70–99)
Glucose-Capillary: 177 mg/dL — ABNORMAL HIGH (ref 70–99)

## 2021-01-28 MED ORDER — POTASSIUM CHLORIDE CRYS ER 20 MEQ PO TBCR
40.0000 meq | EXTENDED_RELEASE_TABLET | Freq: Once | ORAL | Status: AC
  Administered 2021-01-28: 40 meq via ORAL
  Filled 2021-01-28: qty 2

## 2021-01-28 MED ORDER — LACTULOSE 10 GM/15ML PO SOLN
20.0000 g | Freq: Three times a day (TID) | ORAL | Status: DC
  Administered 2021-01-28 – 2021-01-30 (×7): 20 g via ORAL
  Filled 2021-01-28 (×7): qty 30

## 2021-01-28 NOTE — Consult Note (Addendum)
Referring Provider:  [facility] Primary Care Physician:  [name], MD Primary Gastroenterologist:  Dr. Therisa Doyne  Reason for Consultation: Abnormal LFTs, fever, recent TIPS   HPI: [name] is a 75 y.o. male with past medical history of hepatic cirrhosis, history of hepatocellular carcinoma, history of variceal bleeding s/p TIPS and coil embolization of gastric varices on Jan 13, 2021 currently admitted to the hospital with encephalopathy.   He was discharged from hospital on May 2022 and was readmitted on Jan 22, 2021.  Upon initial evaluation, his alkaline phosphatase was 170 and T bili was 1.9.  Normal AST and ALT.  Hemoglobin was 7.4.  Blood cultures were negative.  Normal urinalysis.  His encephalopathy is being treated with lactulose and Xifaxan.  He had a fever 100.9 yesterday and now having elevated alkaline phosphatase.  He has been started on empiric antibiotics.  GI is consulted for further management.  Patient seen and examined at bedside.  He is slow to respond but denies any GI symptoms.  Denies abdominal pain, nausea and vomiting.  Denies blood in the stool or black stool.  Case discussed with hospitalist.   Past Medical History:  Diagnosis Date  . Arthritis    "joints tighten up sometimes" (03/27/2104)  . Carcinoma of liver, hepatocellular ([place]) 06/30/2020  . CHF (congestive heart failure) ([place])   . Chronic lower back pain   . Coronary artery disease    a. 05/2012 Cath/PCI: LM 40ost, 50-60d, LAD 99p ruptured plaque (3.0x28 DES), LCX 50p/m, RCA 30-40p, 32m EF 65-70%.  . Coronary artery disease involving native coronary artery of native heart without angina pectoris    Severe left main disease at catheterization July 2015  CABG x3 with a LIMA to the LAD, SVG to the OM, SVG to the PDA on 03/31/14. EF 60% by cath.   . Diastolic heart failure ([place]   . GERD without esophagitis 08/04/2010  . Hepatic cirrhosis ([place]    a. Dx 01/2014 - CT a/p   . History of blood transfusion     "related to bleeding ulcers"  . History of concussion    1976--  NO RESIDUAL  . History of GI bleed    a. UGIB 07/2012;  b. 01/2014 admission with GIB/FOB stool req 1U prbc's->EGD showed portal gastropathy, barrett's esoph, and chronic active h. pylori gastritis.  .Marland KitchenHistory of gout    2007 &  2008  LEFT LEG-- NO ISSUE SINCE  . Hyperlipidemia   . Iron deficiency anemia   . Kidney stones   . OA (osteoarthritis of spine)    LOWER BACK--  INTERMITTANT LEFT LEG NUMBNESS  . OSA (obstructive sleep apnea)    PULMOLOGIST-  DR CLANCE--  MODERATE OSA  STARTED CPAP 2012--  BUT CURRENTLY HAS NOT USED PAST 6 MONTHS  . Phimosis    a. s/p circumcision 2015.  . Type 2 diabetes mellitus ([place]   . Unspecified essential hypertension     Past Surgical History:  Procedure Laterality Date  . ANKLE FRACTURE SURGERY Right 1989   "plate put in"  . APPENDECTOMY  05-16-2004   open  . BIOPSY  01/08/2021   Procedure: BIOPSY;  Surgeon: SWilford Corner MD;  [facility];  Service: Endoscopy;;  . CIRCUMCISION N/A 09/09/2013   Procedure: CIRCUMCISION ADULT;  Surgeon: DBernestine Amass MD;  [facility]  Service: Urology;  Laterality: N/A;  . COLECTOMY  05-16-2004  . COLONOSCOPY WITH PROPOFOL N/A 11/20/2020   Procedure: COLONOSCOPY WITH  PROPOFOL;  Surgeon: Ronald Lobo, MD;  [facility];  Service: Endoscopy;  Laterality: N/A;  . CORONARY ANGIOPLASTY WITH STENT PLACEMENT  06/28/2012  DR COOPER   PCI W/  X1 DES to [place]. LAD/  LM  40% OSTIAL & 50-60% DISTAL /  50% PROX LCX/  30-40% PROX RCA & 50% MID RCA/   LVEF 65-70%  . CORONARY ARTERY BYPASS GRAFT N/A 03/31/2014   Procedure: CORONARY ARTERY BYPASS GRAFTING (CABG) times 3 using left internal mammary artery and right saphenous vein.;  Surgeon: Melrose Nakayama, MD;  Location: [place];  Service: Open Heart Surgery;  Laterality: N/A;  . DOBUTAMINE STRESS ECHO  06-08-2012   MODERATE HYPOKINESIS/ ISCHEMIA MID INFERIOR WALL   . ESOPHAGOGASTRODUODENOSCOPY  08/15/2012   Procedure: ESOPHAGOGASTRODUODENOSCOPY (EGD);  Surgeon: Wonda Horner, MD;  Location[facility] END[facility]COPY;  Service: Endoscopy;  Laterality: N/A;  . ESOPHAGOGASTRODUODENOSCOPY N/A 02/17/2014   Procedure: ESOPHAGOGASTRODUODENOSCOPY (EGD);  Surgeon: Jeryl Columbia, MD;  Location: [facility] ENDOSCOPY;  Service: Endoscopy;  Laterality: N/A;  . ESOPHAGOGASTRODUODENOSCOPY N/A 04/17/2020   Procedure: ESOPHAGOGASTRODUODENOSCOPY (EGD);  Surgeon: Ronnette Juniper, MD;  Location: [place];  Service: Gastroenterology;  Laterality: N/A;  . ESOPHAGOGASTRODUODENOSCOPY N/A 09/11/2020   Procedure: ESOPHAGOGASTRODUODENOSCOPY (EGD);  Surgeon: Clarene Essex, MD;  [facility];  Service: Endoscopy;  Laterality: N/A;  . ESOPHAGOGASTRODUODENOSCOPY  10/09/2020  . ESOPHAGOGASTRODUODENOSCOPY N/A 10/09/2020   Procedure: ESOPHAGOGASTRODUODENOSCOPY (EGD);  Surgeon: Otis Brace, MD;  Location: [facility] ENDOSCOPY;  Service: Gastroenterology;  Laterality: N/A;  . ESOPHAGOGASTRODUODENOSCOPY N/A 01/08/2021   Procedure: ESOPHAGOGASTRODUODENOSCOPY (EGD);  Surgeon: Wilford Corner, MD;  [facility];  Service: Endoscopy;  Laterality: N/A;  . ESOPHAGOGASTRODUODENOSCOPY (EGD) WITH PROPOFOL N/A 09/30/2017   Procedure: ESOPHAGOGASTRODUODENOSCOPY (EGD) WITH PROPOFOL;  Surgeon: Wonda Horner, MD;  [facility];  Service: Endoscopy;  Laterality: N/A;  . ESOPHAGOGASTRODUODENOSCOPY (EGD) WITH PROPOFOL N/A 12/20/2017   Procedure: ESOPHAGOGASTRODUODENOSCOPY (EGD) WITH PROPOFOL;  Surgeon: Clarene Essex, MD;  Location: [place];  Service: Endoscopy;  Laterality: N/A;  . ESOPHAGOGASTRODUODENOSCOPY (EGD) WITH PROPOFOL N/A 08/10/2020   Procedure: ESOPHAGOGASTRODUODENOSCOPY (EGD) WITH PROPOFOL;  Surgeon: Wilford Corner, MD;  Location: [place];  Service: Endoscopy;  Laterality: N/A;  . ESOPHAGOGASTRODUODENOSCOPY (EGD) WITH PROPOFOL N/A 11/20/2020   Procedure: ESOPHAGOGASTRODUODENOSCOPY (EGD)  WITH PROPOFOL;  Surgeon: Ronald Lobo, MD;  [facility];  Service: Endoscopy;  Laterality: N/A;  . GIVENS CAPSULE STUDY N/A 11/20/2020   Procedure: GIVENS CAPSULE STUDY;  Surgeon: Ronald Lobo, MD;  Location: [facility] ENDOSCOPY;  Service: Endoscopy;  Laterality: N/A;  . HERNIA REPAIR    . INTRAOPERATIVE TRANSESOPHAGEAL ECHOCARDIOGRAM N/A 03/31/2014   Procedure: INTRAOPERATIVE TRANSESOPHAGEAL ECHOCARDIOGRAM;  Surgeon: Melrose Nakayama, MD;  Location: [place];  Service: Open Heart Surgery;  Laterality: N/A;  . IR ANGIOGRAM SELECTIVE EACH ADDITIONAL VESSEL  06/16/2020  . IR ANGIOGRAM SELECTIVE EACH ADDITIONAL VESSEL  06/16/2020  . IR ANGIOGRAM SELECTIVE EACH ADDITIONAL VESSEL  06/16/2020  . IR ANGIOGRAM SELECTIVE EACH ADDITIONAL VESSEL  06/16/2020  . IR ANGIOGRAM SELECTIVE EACH ADDITIONAL VESSEL  06/16/2020  . IR ANGIOGRAM SELECTIVE EACH ADDITIONAL VESSEL  06/16/2020  . IR ANGIOGRAM SELECTIVE EACH ADDITIONAL VESSEL  06/16/2020  . IR ANGIOGRAM SELECTIVE EACH ADDITIONAL VESSEL  06/16/2020  . IR ANGIOGRAM SELECTIVE EACH ADDITIONAL VESSEL  06/16/2020  . IR ANGIOGRAM SELECTIVE EACH ADDITIONAL VESSEL  06/30/2020  . IR ANGIOGRAM SELECTIVE EACH ADDITIONAL VESSEL  06/30/2020  . IR ANGIOGRAM VISCERAL SELECTIVE  06/16/2020  . IR ANGIOGRAM VISCERAL SELECTIVE  06/30/2020  . IR EMBO ARTERIAL NOT  HEMORR HEMANG INC GUIDE ROADMAPPING  06/16/2020  . IR EMBO TUMOR ORGAN ISCHEMIA INFARCT INC GUIDE ROADMAPPING  06/30/2020  . IR EMBO VENOUS NOT HEMORR HEMANG  INC GUIDE ROADMAPPING  01/13/2021  . IR IVUS EACH ADDITIONAL NON CORONARY VESSEL  01/13/2021  . IR RADIOLOGIST EVAL & MGMT  05/06/2020  . IR RADIOLOGIST EVAL & MGMT  05/20/2020  . IR TIPS  01/13/2021  . IR US GUIDE VASC ACCESS RIGHT  06/16/2020  . IR US GUIDE VASC ACCESS RIGHT  06/30/2020  . IR US GUIDE VASC ACCESS RIGHT  01/13/2021  . LAPAROSCOPIC UMBILICAL HERNIA REPAIR W/ MESH  06-06-2011  . LEFT HEART CATHETERIZATION WITH CORONARY ANGIOGRAM N/A 03/28/2014    Procedure: LEFT HEART CATHETERIZATION WITH CORONARY ANGIOGRAM;  Surgeon: Sinclair Grooms, MD;  Location[facility];  Service: Cardiovascular;  Laterality: N/A;  . LIPOMA EXCISION Left 08/25/2017   Procedure: EXCISION LIPOMA LEFT POSTERIOR THIGH;  Surgeon: Clovis Riley, MD;  Location: [place];  Service: General;  Laterality: Left;  . NEPHROLITHOTOMY  1990'S  . OPEN APPENDECTOMY W/ PARTIAL CECECTOMY  05-16-2004  . PERCUTANEOUS CORONARY STENT INTERVENTION (PCI-S) N/A 06/28/2012   Procedure: PERCUTANEOUS CORONARY STENT INTERVENTION (PCI-S);  Surgeon: Sherren Mocha, MD;  [facility] C[facility] LAB;  Service: Cardiovascular;  Laterality: N/A;  . POLYPECTOMY  11/20/2020   Procedure: POLYPECTOMY;  Surgeon: Ronald Lobo, MD;  [facility]OS[facility];  Service: Endoscopy;;  . RADIOLOGY WITH ANESTHESIA N/A 01/13/2021   Procedure: IR WITH ANESTHESIA - TIPS;  Surgeon: Suzette Battiest, MD;  Location: [place];  Service: Radiology;  Laterality: N/A;    Prior to Admission medications   Medication Sig Start Date End Date Taking? Authorizing Provider  albuterol (VENTOLIN HFA) 108 (90 Base) MCG/ACT inhaler Inhale 2 puffs into the lungs every 6 (six) hours as needed for wheezing or shortness of breath. 03/12/20  Yes [name], MD  atorvastatin (LIPITOR) 40 MG tablet TAKE 1 TABLET BY MOUTH DAILY AT 6 PM Patient taking differently: Take 40 mg by mouth daily. 01/04/21  Yes [name], MD  gabapentin (NEURONTIN) 100 MG capsule TAKE 1 CAPSULE(100 MG) BY MOUTH TWICE DAILY Patient taking differently: Take 100 mg by mouth in the morning and at bedtime. 08/03/20  Yes [name], MD  glimepiride (AMARYL) 2 MG tablet TAKE 1 TABLET(2 MG) BY MOUTH DAILY BEFORE BREAKFAST Patient taking differently: Take 2 mg by mouth daily with breakfast. 12/16/20  Yes [name], MD  hydrocortisone (ANUSOL-HC) 2.5 % rectal cream Place 1 application rectally 2 (two) times daily. Patient taking  differently: Place 1 application rectally 2 (two) times daily as needed for hemorrhoids. 12/04/20  Yes [name], MD  metFORMIN (GLUCOPHAGE) 850 MG tablet TAKE 1 TABLET(850 MG) BY MOUTH TWICE DAILY WITH A MEAL Patient taking differently: Take 850 mg by mouth 2 (two) times daily with a meal. 01/08/21  Yes [name], MD  nitroGLYCERIN (NITROSTAT) 0.4 MG SL tablet Place 1 tablet (0.4 mg total) under the tongue every 5 (five) minutes as needed for chest pain. 06/08/16  Yes Nahser, Wonda Cheng, MD  polyethylene glycol powder (GLYCOLAX/MIRALAX) 17 GM/SCOOP powder Take 8.5 g by mouth See admin instructions. Mix 8.5 grams of powder into a sufficient amount of liquid and drink 3 times a day   Yes [provider]  spironolactone (ALDACTONE) 25 MG tablet Take 1 tablet (25 mg total) by mouth daily. 08/18/20  Yes Danford, Suann Larry, MD  torsemide [facility])  20 MG tablet Take 2 tablets (40 mg total) by mouth 2 (two) times daily. 08/18/20  Yes Danford, Suann Larry, MD  umeclidinium-vilanterol (ANORO ELLIPTA) 62.5-25 MCG/INH AEPB Inhale 1 puff into the lungs daily. 03/12/20  Yes [name], MD  Accu-Chek Softclix Lancets lancets USE AS DIRECTED TO TEST BLOOD SUGAR FOUR TIMES DAILY 03/13/20   [name], MD  blood glucose meter kit and supplies Dispense based on patient and insurance preference. Use up to four times daily as directed. (FOR ICD-10 E10.9, E11.9). 03/12/20   [name], MD  lactulose (CHRONULAC) 10 GM/15ML solution Take 30 mLs (20 g total) by mouth 3 (three) times daily. Patient not taking: No sig reported 01/17/21   Dessa Phi, DO  pantoprazole (PROTONIX) 40 MG tablet Take 1 tablet (40 mg total) by mouth 2 (two) times daily. 09/12/20   Pokhrel, Corrie Mckusick, MD  Psyllium (METAMUCIL PO) Take 15 mLs by mouth 3 (three) times daily with meals. Mix with 4 oz of juice or water Patient not taking: Reported on 01/23/2021    [provider]   sucralfate (CARAFATE) 1 g tablet Take 1 tablet (1 g total) by mouth 4 (four) times daily. Patient not taking: Reported on 01/23/2021 11/23/20 12/23/20  Elodia Florence., MD    Scheduled Meds: . chlorhexidine  15 mL Mouth Rinse BID  . insulin aspart  0-5 Units Subcutaneous QHS  . insulin aspart  0-9 Units Subcutaneous TID WC  . lactulose  20 g Oral TID  . mouth rinse  15 mL Mouth Rinse q12n4p  . pantoprazole  40 mg Oral BID  . rifaximin  550 mg Oral BID  . spironolactone  25 mg Oral Daily  . torsemide  40 mg Oral BID  . umeclidinium-vilanterol  1 puff Inhalation Daily   Continuous Infusions: . sodium chloride    . cefTRIAXone (ROCEPHIN)  IV 2 g (01/27/21 1733)   PRN Meds:.sodium chloride, acetaminophen, albuterol, nitroGLYCERIN  Allergies as of 01/22/2021 - Review Complete 01/13/2021  Allergen Reaction Noted  . Fluzone quadrivalent [influenza vac split quad] Other (See Comments) 01/07/2021    Family History  Problem Relation Age of Onset  . Lung cancer Sister   . Cancer Sister        lung  . Cancer Mother   . Cancer Father        died in his 30s.  . Cancer Brother        lung  . Coronary artery disease Other   . Diabetes Other   . Colon cancer Other   . Cancer Sister        lung    Social History   Socioeconomic History  . Marital status: Married    Spouse name: Not on file  . Number of children: Not on file  . Years of education: Not on file  . Highest education level: Not on file  Occupational History  . Not on file  Tobacco Use  . Smoking status: Former Smoker    Packs/day: 2.00    Years: 40.00    Pack years: 80.00    Types: Cigarettes    Quit date: 08/29/1996    Years since quitting: 24.4  . Smokeless tobacco: Never Used  Vaping Use  . Vaping Use: Never used  Substance and Sexual Activity  . Alcohol use: Yes    Alcohol/week: 8.0 standard drinks    Types: 8 Cans of beer per week    Comment: 2 beers every other  day  . Drug use: No  . Sexual  activity: Yes  Other Topics Concern  . Not on file  Social History Narrative   Lives in [place] with wife, son, and son's family.   Social Determinants of Health   Financial Resource Strain: Medium Risk  . Difficulty of Paying Living Expenses: Somewhat hard  Food Insecurity: No Food Insecurity  . Worried About Charity fundraiser in the Last Year: Never true  . Ran Out of Food in the Last Year: Never true  Transportation Needs: No Transportation Needs  . Lack of Transportation (Medical): No  . Lack of Transportation (Non-Medical): No  Physical Activity: Not on file  Stress: Not on file  Social Connections: Not on file  Intimate Partner Violence: Not on file    Review of Systems:  Limited review of system as per HPI. Physical Exam: Vital signs: Vitals:   01/28/21 0230 01/28/21 0845  BP: 105/73   Pulse:    Resp:    Temp:    SpO2:  94%   Last BM Date: 01/27/21 General:   Alert but slow to respond Lungs: No visible respiratory distress.  Anterior exam only Heart:  Regular rate and rhythm; no murmurs, clicks, rubs,  or gallops. Abdomen: Minimally distended, soft, nontender, bowel sounds present.  No peritoneal signs Lower extremity : 1  + Edema noted  Rectal:  Deferred  GI:  Lab Results: Recent Labs    01/27/21 1748 01/27/21 2329 01/28/21 0132  WBC 17.3* 13.7* 11.9*  HGB 7.9* 8.8* 7.7*  HCT 26.1* 28.9* 25.0*  PLT 105* 62* 104*   BMET Recent Labs    01/26/21 0107 01/27/21 0045 01/28/21 0132  NA 137 137 135  K 3.1* 3.6 3.3*  CL 110 108 108  CO2 20* 18* 21*  GLUCOSE 144* 135* 121*  BUN '21 17 21  ' CREATININE 1.00 1.01 1.39*  CALCIUM 8.3* 8.2* 7.8*   LFT Recent Labs    01/28/21 0132  PROT 5.3*  ALBUMIN 2.4*  AST 57*  ALT 46*  ALKPHOS 415*  BILITOT 1.8*   PT/INR No results for input(s): LABPROT, INR in the last 72 hours.   Studies/Results: DG Chest Port 1 View  Result Date: 01/27/2021 CLINICAL DATA:  Fever. EXAM: PORTABLE CHEST 1 VIEW COMPARISON:   One-view chest x-ray 01/22/2021 FINDINGS: Heart is enlarged. Increasing interstitial and airspace disease is present bilaterally. Postoperative changes noted. IMPRESSION: Cardiomegaly with increasing interstitial and airspace disease compatible with edema versus infection. Electronically Signed   By: San Morelle M.D.   On: 01/27/2021 16:42    Impression/Plan: -75 year old patient with decompensated hepatic cirrhosis complicated by [facility] and with variceal bleeding.  S/p TIPS and coil embolization of gastric varices on Jan 13, 2021.  Admitted with encephalopathy.  Now having fever and elevated LFTs. ??  Recommendation of cirrhosis versus SBP.  Recommendations ---------------------- -Get  paracentesis to rule out SBP. -If fluid analysis negative for SBP, recommend CT scan for further evaluation and to rule out post procedure complications -Repeat labs in the morning -Discussed with hospitalist. -he has  elevated creatinine at 1.39 today.  I will DC diuretics for now .  He is on torsemide 40 mg twice a day. -GI will follow    LOS: 5 days   Otis Brace  MD, FACP 01/28/2021, 9:54 AM  Contact #  [phone]

## 2021-01-28 NOTE — Consult Note (Signed)
   [facility] Inpatient Consult   01/28/2021  JAVONNE DORKO January 27, 1946 [id number]   [facility] [ACO] Patient: [place] Medicare  Readmission less than 7 days: extreme high risk   Patient is active in Embedded Chronic Care Management  for [place] [facility] services. Patient will have the transition of care call conducted by the primary care provider. This patient is also in an Embedded practice which has a chronic disease management Embedded Care Management team. Patient is being recommended for a skilled nursing facility stay, This would have to have insurance authorization noted.   Plan: Continue to follow for progress and update Embedded Chronic Care Management team aware of any needs.    Please contact for further questions,  [name], RN BSN [facility] Liaison  [phone] business mobile phone Toll free office [phone]  Fax number: [phone] [email or web address]

## 2021-01-28 NOTE — Progress Notes (Signed)
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  At baseline patient is typically alert and oriented x4 and ambulatory.  Symptoms onset roughly 3 days ago with associated confusion, nausea, chills, weakness and vomiting.  Apparently patient was not taking his lactulose at home.  Patient recently discharged 5 days prior after an 11-day hospitalization due to symptomatic anemia in the setting of recurrent GI bleeding requiring 5 unit PRBC and TIPS procedure with embolization of gastric varices by IR on 01/13/2021.  Subjective:  Patient reports multiple episodes of diarrhea yesterday, no recurrence of fever, report generalized weakness and fatigue.  Reports appetite is improving as well. .  Assessment & Plan:   Principal Problem:   Hepatic encephalopathy ([employer]) Active Problems:   Type 2 diabetes with complication (HCC)   GERD without esophagitis   Hyperlipidemia with target LDL less than 70   CKD (chronic kidney disease), stage III (HCC)   Alcoholic cirrhosis of liver with ascites (HCC)   Hepatocellular carcinoma (HCC)   Acute hepatic encephalopathy secondary to medication noncompliance, POA -Ammonia downtrending appropriately, mental status improving. -status post TIPS procedure for gastric varices.   -CT head unremarkable  -Plies, patient with multiple episodes of diarrhea, so I will decrease his lactulose to 20 g oral 3 times daily, continue with rifaximin. -GI consulted, will await for  further recommendations.  Normocytic Anemia, with Hemoccult positive -Continue with supportive transfusions, Recently hospitalized and status posttransfusion 5 unit PRBCs.   -Protonix 18m PO q12h -Hemoglobin 7.7 this morning, no indication for transfusion as discussed with GI  Hepatic cirrhosis Gastric/esophageal varices Portal gastropathy EGD 5/13 by Dr. SMichail Sermonnotable for small distal esophagus varices, erosions/erythema gastric antrum, gastric varices within the fundus, mild portal hypertensive gastropathy.  Patient underwent TIPS and embolization of gastric varices by IR on 5/18 -Patient with fever of 100.9 on 5/31, procalcitonin is elevated, started on Rocephin, abdomen is nontender, chest x-ray with no pneumonia, UA with no evidence of UTI as well. -Alk phos trending up, so GI were consulted for further recommendations  Chronic diastolic congestive heart failure TTE with LVEF 629-93% grade 3 diastolic dysfunction.  On torsemide and spironolactone outpatient -has been resumed, remains with significant lower extremity edema. -Strict I's and O's and daily weights --Monitor renal function closely daily  Lactic acidosis, likely multifactorial Etiology unclear, but suspect from dehydration/hepatic encephalopathy.  No signs of focal infectious process, afebrile without leukocytosis, urinalysis unremarkable, chest x-ray without focal consolidation. Started empirically on antibiotics by admitting physician with vancomycin and cefepime; discontinued as his etiology of lactic acidosis related to his encephalopathy from rather than infectious cause.  CKD stage IIIa - Baseline creatinine 1.2.   -Creatinine within normal limits today unclear if baseline is truly stage 3A -given GFR is greater than 60 at this point  Type 2 diabetes mellitus On glimepiride and metformin outpatient. --Hold oral hypoglycemics while inpatient --SSI for coverage --CBGs before every meal/at  bedtime  Hypokalemia -repleted  HLD: Consider restarting Lipitor on discharge  Morbid obesity Body mass index is  33.99 kg/m.  Discussed with patient needs for aggressive lifestyle changes/weight loss as this complicates all facets of care. Outpatient follow-up with PCP.   Weakness/deconditioning: - PT/OT evaluation  DVT prophylaxis: SCDs, chemical DVT prophylaxis contraindicated with positive FOBT   Code Status: Full Code Family Communication: No family present at bedside this am  Disposition Plan:  Level of care: Telemetry Medical Status is: Observation  The patient will require care spanning > 2 midnights and should be moved to inpatient because: Altered mental status, Ongoing diagnostic testing needed not appropriate for outpatient work up, Unsafe d/c plan, IV treatments appropriate due to intensity of illness or inability to take PO and Inpatient level of care appropriate due to severity of illness  Dispo: The patient is from: Home              Anticipated d/c is to: To be determined              Patient currently is not medically stable to d/c.   Difficult to place patient No    Consultants:   EDP discussed care with GI, Dr. Cristina Gong  Procedures:   None  Antimicrobials:   Vancomycin 5/27 - 5/28  Cefepime 5/27 - 5/28  Metronidazole 5/27 - 5/27    Objective: Vitals:   01/27/21 1828 01/27/21 2137 01/28/21 0230 01/28/21 0845  BP: (!) 89/42 (!) 94/43 105/73   Pulse: 69 68    Resp: 16 16    Temp: 98.3 F (36.8 C) 98.2 F (36.8 C)    TempSrc: Axillary Axillary    SpO2: 100% 97%  94%  Weight:      Height:        Intake/Output Summary (Last 24 hours) at 01/28/2021 1300 Last data filed at 01/28/2021 0751 Gross per 24 hour  Intake 0 ml  Output 225 ml  Net -225 ml   Filed Weights   01/23/21 0955 01/24/21 0457  Weight: 103.4 kg 101.4 kg    Examination:  Awake Alert, more coherent and communicative today, extremely frail, deconditioned.   Symmetrical  Chest wall movement, Good air movement bilaterally, CTAB RRR,No Gallops,Rubs or new Murmurs, No Parasternal Heave +ve B.Sounds, Abd Soft, No tenderness, No rebound - guarding or rigidity. No Cyanosis, Clubbing ,+2  edema, No new Rash or bruise     Data Reviewed: I have personally reviewed following labs and imaging studies  CBC: Recent Labs  Lab 01/22/21 1558 01/22/21 1808 01/26/21 0107 01/26/21 1919 01/27/21 0045 01/27/21 1748 01/27/21 2329 01/28/21 0132  WBC 8.4   < > 9.1  --  9.3 17.3* 13.7* 11.9*  NEUTROABS 7.7  --   --   --   --   --   --   --   HGB 7.4*   < > 6.9* 8.0* 8.3* 7.9* 8.8* 7.7*  HCT 25.2*   < > 23.4* 26.8* 27.1* 26.1* 28.9* 25.0*  MCV 80.3   < > 80.7  --  80.7 80.1 79.8* 79.1*  PLT 108*   < > 120*  --  132* 105* 62* 104*   < > = values in this interval not displayed.   Basic Metabolic Panel: Recent Labs  Lab 01/24/21 0033 01/24/21 0934 01/24/21 2044 01/25/21 0036 01/26/21 0107 01/27/21 0045 01/28/21 0132  NA 146*  --   --  147* 137 137 135  K 2.8*   < > 3.4* 3.6 3.1* 3.6 3.3*  CL 115*  --   --  118* 110 108 108  CO2 20*  --   --  21* 20* 18* 21*  GLUCOSE 160*  --   --  134* 144* 135* 121*  BUN 21  --   --  24* _0 CREATININE 1.36*  --   --  1.24 1.00 1.01 1.39*  CALCIUM 8.4*  --   --  8.4* 8.3* 8.2* 7.8*  MG 1.9  --   --  2.2 2.0  --   --    < > = values in this interval not displayed.   GFR: Estimated Creatinine Clearance: 53.8 mL/min (A) (by C-G formula based on SCr of 1.39 mg/dL (H)). Liver Function Tests: Recent Labs  Lab 01/23/21 0500 01/24/21 0033 01/25/21 0036 01/26/21 0107 01/28/21 0132  AST 37 32 42* 82* 57*  ALT _1 48* 46*  ALKPHOS 132* 113 124 356* 415*  BILITOT 1.8* 2.1* 2.0* 1.9* 1.8*  PROT 6.1* 6.0* 5.6* 5.3* 5.3*  ALBUMIN 3.1* 2.9* 2.7* 2.6* 2.4*   No results for input(s): LIPASE, AMYLASE in the last 168 hours. Recent Labs  Lab 01/22/21 1610 01/23/21 0350 01/24/21 0033 01/25/21 0730 01/26/21 0107   AMMONIA 69* 153* 83* 16 28   Coagulation Profile: Recent Labs  Lab 01/22/21 1558  INR 1.3*   Cardiac Enzymes: No results for input(s): CKTOTAL, CKMB, CKMBINDEX, TROPONINI in the last 168 hours. BNP (last 3 results) No results for input(s): PROBNP in the last 8760 hours. HbA1C: No results for input(s): HGBA1C in the last 72 hours. CBG: Recent Labs  Lab 01/27/21 1231 01/27/21 1655 01/27/21 2139 01/28/21 0750 01/28/21 1208  GLUCAP 173* 143* 142* 108* 139*   Lipid Profile: No results for input(s): CHOL, HDL, LDLCALC, TRIG, CHOLHDL, LDLDIRECT in the last 72 hours. Thyroid Function Tests: No results for input(s): TSH, T4TOTAL, FREET4, T3FREE, THYROIDAB in the last 72 hours. Anemia Panel: No results for input(s): VITAMINB12, FOLATE, FERRITIN, TIBC, IRON, RETICCTPCT in the last 72 hours. Sepsis Labs: Recent Labs  Lab 01/22/21 1758 01/23/21 0350 01/23/21 1057 01/25/21 0036 01/26/21 0107 01/27/21 1748 01/28/21 0132  PROCALCITON  --   --  0.16  --   --  2.17 2.48  LATICACIDVEN 2.5* 3.2*  --  2.9* 1.9  --   --     Recent Results (from the past 240 hour(s))  Blood Culture (routine x 2)     Status: Abnormal   Collection Time: 01/22/21  2:08 PM   Specimen: BLOOD  Result Value Ref Range Status   Specimen Description BLOOD RIGHT ANTECUBITAL  Final   Special Requests   Final    BOTTLES DRAWN AEROBIC AND ANAEROBIC Blood Culture adequate volume   Culture  Setup Time   Final    GRAM POSITIVE COCCI IN CLUSTERS IN BOTH AEROBIC AND ANAEROBIC BOTTLES CRITICAL RESULT CALLED TO, READ BACK BY AND VERIFIED WITH: KIM HURTH PHARMD _2  01/23/21 EB    Culture (A)  Final    STAPHYLOCOCCUS EPIDERMIDIS THE SIGNIFICANCE OF ISOLATING THIS ORGANISM FROM A SINGLE SET OF BLOOD CULTURES WHEN MULTIPLE SETS ARE DRAWN IS UNCERTAIN. PLEASE NOTIFY THE MICROBIOLOGY DEPARTMENT WITHIN ONE WEEK IF SPECIATION AND SENSITIVITIES ARE REQUIRED. Performed at [facility], [place] [address],  [place], [place] [address]    Report Status 01/25/2021 FINAL  Final  Blood Culture ID Panel (Reflexed)     Status: Abnormal   Collection Time: 01/22/21  2:08 PM  Result Value Ref Range Status   Enterococcus faecalis NOT DETECTED NOT DETECTED Final   Enterococcus Faecium NOT DETECTED NOT DETECTED Final  Listeria monocytogenes NOT DETECTED NOT DETECTED Final   Staphylococcus species DETECTED (A) NOT DETECTED Final    Comment: CRITICAL RESULT CALLED TO, READ BACK BY AND VERIFIED WITH: KIM HURTH PHARMD _0  01/23/21 EB    Staphylococcus aureus (BCID) NOT DETECTED NOT DETECTED Final   Staphylococcus epidermidis DETECTED (A) NOT DETECTED Final    Comment: Methicillin (oxacillin) resistant coagulase negative staphylococcus. Possible blood culture contaminant (unless isolated from more than one blood culture draw or clinical case suggests pathogenicity). No antibiotic treatment is indicated for blood  culture contaminants. CRITICAL RESULT CALLED TO, READ BACK BY AND VERIFIED WITH: KIM HURTH PHARMD _1  01/23/21 EB    Staphylococcus lugdunensis NOT DETECTED NOT DETECTED Final   Streptococcus species NOT DETECTED NOT DETECTED Final   Streptococcus agalactiae NOT DETECTED NOT DETECTED Final   Streptococcus pneumoniae NOT DETECTED NOT DETECTED Final   Streptococcus pyogenes NOT DETECTED NOT DETECTED Final   A.calcoaceticus-baumannii NOT DETECTED NOT DETECTED Final   Bacteroides fragilis NOT DETECTED NOT DETECTED Final   Enterobacterales NOT DETECTED NOT DETECTED Final   Enterobacter cloacae complex NOT DETECTED NOT DETECTED Final   Escherichia coli NOT DETECTED NOT DETECTED Final   Klebsiella aerogenes NOT DETECTED NOT DETECTED Final   Klebsiella oxytoca NOT DETECTED NOT DETECTED Final   Klebsiella pneumoniae NOT DETECTED NOT DETECTED Final   Proteus species NOT DETECTED NOT DETECTED Final   Salmonella species NOT DETECTED NOT DETECTED Final   Serratia marcescens NOT DETECTED NOT DETECTED Final    Haemophilus influenzae NOT DETECTED NOT DETECTED Final   Neisseria meningitidis NOT DETECTED NOT DETECTED Final   Pseudomonas aeruginosa NOT DETECTED NOT DETECTED Final   Stenotrophomonas maltophilia NOT DETECTED NOT DETECTED Final   Candida albicans NOT DETECTED NOT DETECTED Final   Candida auris NOT DETECTED NOT DETECTED Final   Candida glabrata NOT DETECTED NOT DETECTED Final   Candida krusei NOT DETECTED NOT DETECTED Final   Candida parapsilosis NOT DETECTED NOT DETECTED Final   Candida tropicalis NOT DETECTED NOT DETECTED Final   Cryptococcus neoformans/gattii NOT DETECTED NOT DETECTED Final   Methicillin resistance mecA/C DETECTED (A) NOT DETECTED Final    Comment: CRITICAL RESULT CALLED TO, READ BACK BY AND VERIFIED WITH: Gorden Harms PHARMD _2  01/23/21 EB Performed at [facility], [address] [place], [place] [address]   Urine culture     Status: None   Collection Time: 01/22/21  3:58 PM   Specimen: In/Out Cath Urine  Result Value Ref Range Status   Specimen Description IN/OUT CATH URINE  Final   Special Requests NONE  Final   Culture   Final    NO GROWTH Performed at [facility], [place] [address], [place], [place] [address]    Report Status 01/26/2021 FINAL  Final  Blood Culture (routine x 2)     Status: None   Collection Time: 01/22/21  4:03 PM   Specimen: BLOOD LEFT WRIST  Result Value Ref Range Status   Specimen Description BLOOD LEFT WRIST  Final   Special Requests   Final    BOTTLES DRAWN AEROBIC AND ANAEROBIC Blood Culture adequate volume   Culture   Final    NO GROWTH 5 DAYS Performed at [facility], [place] [address] [place], [place] [address]    Report Status 01/27/2021 FINAL  Final  Resp Panel by RT-PCR (Flu A&B, Covid) Nasopharyngeal Swab     Status: None   Collection Time: 01/22/21  4:50 PM   Specimen: Nasopharyngeal Swab;  Nasopharyngeal(NP) swabs in vial transport medium  Result Value Ref Range Status   SARS Coronavirus 2 by RT PCR  NEGATIVE NEGATIVE Final    Comment: (NOTE) SARS-CoV-2 target nucleic acids are NOT DETECTED.  The SARS-CoV-2 RNA is generally detectable in upper respiratory specimens during the acute phase of infection. The lowest concentration of SARS-CoV-2 viral copies this assay can detect is 138 copies/mL. A negative result does not preclude SARS-Cov-2 infection and should not be used as the sole basis for treatment or other patient management decisions. A negative result may occur with  improper specimen collection/handling, submission of specimen other than nasopharyngeal swab, presence of viral mutation(s) within the areas targeted by this assay, and inadequate number of viral copies(<138 copies/mL). A negative result must be combined with clinical observations, patient history, and epidemiological information. The expected result is Negative.  Fact Sheet for Patients:  EntrepreneurPulse.com.au  Fact Sheet for Healthcare Providers:  IncredibleEmployment.be  This test is no t yet approved or cleared by the [place] FDA and  has been authorized for detection and/or diagnosis of SARS-CoV-2 by FDA under an Emergency Use Authorization (EUA). This EUA will remain  in effect (meaning this test can be used) for the duration of the COVID-19 declaration under Section 564(b)(1) of the Act, 21 U.S.C.section 360bbb-3(b)(1), unless the authorization is terminated  or revoked sooner.       Influenza A by PCR NEGATIVE NEGATIVE Final   Influenza B by PCR NEGATIVE NEGATIVE Final    Comment: (NOTE) The Xpert Xpress SARS-CoV-2/FLU/RSV plus assay is intended as an aid in the diagnosis of influenza from Nasopharyngeal swab specimens and should not be used as a sole basis for treatment. Nasal washings and aspirates are unacceptable for Xpert Xpress SARS-CoV-2/FLU/RSV testing.  Fact Sheet for Patients: EntrepreneurPulse.com.au  Fact Sheet for  Healthcare Providers: IncredibleEmployment.be  This test is not yet approved or cleared by the [place] FDA and has been authorized for detection and/or diagnosis of SARS-CoV-2 by FDA under an Emergency Use Authorization (EUA). This EUA will remain in effect (meaning this test can be used) for the duration of the COVID-19 declaration under Section 564(b)(1) of the Act, 21 U.S.C. section 360bbb-3(b)(1), unless the authorization is terminated or revoked.  Performed at [facility], [place] [address] [place] [address] [place], [place] [address]   MRSA PCR Screening     Status: None   Collection Time: 01/24/21  5:04 AM   Specimen: Nasal Mucosa; Nasopharyngeal  Result Value Ref Range Status   MRSA by PCR NEGATIVE NEGATIVE Final    Comment:        The GeneXpert MRSA Assay (FDA approved for NASAL specimens only), is one component of a comprehensive MRSA colonization surveillance program. It is not intended to diagnose MRSA infection nor to guide or monitor treatment for MRSA infections. Performed at [facility], [place] [address] [place], [place] [address]   Culture, blood (routine x 2)     Status: None (Preliminary result)   Collection Time: 01/27/21  5:48 PM   Specimen: BLOOD  Result Value Ref Range Status   Specimen Description BLOOD SITE NOT SPECIFIED  Final   Special Requests   Final    BOTTLES DRAWN AEROBIC AND ANAEROBIC Blood Culture adequate volume   Culture   Final    NO GROWTH < 12 HOURS Performed at [facility], [place] [address] [place],  [address]    Report Status PENDING  Incomplete  Culture, blood (routine x 2)  Status: None (Preliminary result)   Collection Time: 01/27/21  5:48 PM   Specimen: BLOOD  Result Value Ref Range Status   Specimen Description BLOOD SITE NOT SPECIFIED  Final   Special Requests   Final    BOTTLES DRAWN AEROBIC AND ANAEROBIC Blood Culture adequate volume   Culture   Final    NO GROWTH < 12 HOURS Performed  at [facility], [address], [place],  [address]    Report Status PENDING  Incomplete         Radiology Studies: DG Chest Port 1 View  Result Date: 01/27/2021 CLINICAL DATA:  Fever. EXAM: PORTABLE CHEST 1 VIEW COMPARISON:  One-view chest x-ray 01/22/2021 FINDINGS: Heart is enlarged. Increasing interstitial and airspace disease is present bilaterally. Postoperative changes noted. IMPRESSION: Cardiomegaly with increasing interstitial and airspace disease compatible with edema versus infection. Electronically Signed   By: San Morelle M.D.   On: 01/27/2021 16:42    Scheduled Meds: . chlorhexidine  15 mL Mouth Rinse BID  . insulin aspart  0-5 Units Subcutaneous QHS  . insulin aspart  0-9 Units Subcutaneous TID WC  . lactulose  20 g Oral TID  . mouth rinse  15 mL Mouth Rinse q12n4p  . pantoprazole  40 mg Oral BID  . rifaximin  550 mg Oral BID  . spironolactone  25 mg Oral Daily  . umeclidinium-vilanterol  1 puff Inhalation Daily   Continuous Infusions: . sodium chloride    . cefTRIAXone (ROCEPHIN)  IV 2 g (01/27/21 1733)     LOS: 5 days    Phillips Climes, MD [facility] Available via Epic secure chat 7am-7pm After these hours, please refer to coverage provider listed on [email or web address] 01/28/2021, 1:00 PM

## 2021-01-28 NOTE — Progress Notes (Signed)
Physical Therapy Treatment Patient Details Name: [name] MRN: [id number] DOB: 05/14/1946 Today's Date: 01/28/2021    History of Present Illness Pt is a 75 y/o male admitted secondary to AMS. Likely secondary to hepatic encephalopathy. Pt with recent admission in early 12/2020 for GI bleed. PMH includes CAD s/p CABG, dCHF, HTN, alcoholic cirrhosis, and hepatocellular carcinoma.    PT Comments    Pt is progressing towards goals as he required less assist this session from previous. Noted some cognitive deficits remain as pt continues to be disorianed to situation and time. Noted significant BIL LE edema. Pt transferred to [facility] and ambulated to and from sink for hand hygiene. Very slow processing for all activities. Current plan remains appropriate.    Follow Up Recommendations  SNF;Supervision/Assistance - 24 hour (unless family can provide necessary support)     Equipment Recommendations  Other (comment) (TBD pending progression)    Recommendations for Other Services       Precautions / Restrictions Precautions Precautions: Fall;Other (comment) Precaution Comments: reports incontinent Restrictions Weight Bearing Restrictions: No    Mobility  Bed Mobility Overal bed mobility: Needs Assistance Bed Mobility: Supine to Sit     Supine to sit: Supervision;HOB elevated     General bed mobility comments: use of bed rails with HOB elevated. increased time and effort.    Transfers Overall transfer level: Needs assistance Equipment used: Rolling walker (2 wheeled) Transfers: Sit to/from [facility] Sit to Stand: Min guard Stand pivot transfers: Min guard       General transfer comment: Close min guard for safety. No physical assist needed.  Ambulation/Gait Ambulation/Gait assistance: Min guard Gait Distance (Feet): 12 Feet Assistive device: Rolling walker (2 wheeled) Gait Pattern/deviations: Shuffle Gait velocity: Decreased Gait velocity  interpretation: <1.31 ft/sec, indicative of household ambulator General Gait Details: Very slow guarded gait with small shuffling steps.   Stairs             Wheelchair Mobility    Modified Rankin (Stroke Patients Only)       Balance Overall balance assessment: Needs assistance Sitting-balance support: No upper extremity supported;Feet supported Sitting balance-Leahy Scale: Fair     Standing balance support: Bilateral upper extremity supported;During functional activity Standing balance-Leahy Scale: Poor Standing balance comment: Reliant on BUE support                            Cognition Arousal/Alertness: Awake/alert Behavior During Therapy: Flat affect Overall Cognitive Status: No family/caregiver present to determine baseline cognitive functioning                                 General Comments: Disoriented to situation and time. Very flat affect with slow processing noted. Decreased awareness of deficits. Muttering incohearent sentences at times.      Exercises      General Comments General comments (skin integrity, edema, etc.): Able to perform peri care w/o assist.      Pertinent Vitals/Pain Pain Assessment: No/denies pain    Home Living                      Prior Function            PT Goals (current goals can now be found in the care plan section) Acute Rehab PT Goals Patient Stated Goal: none stated PT Goal Formulation: With patient Time For Goal  Achievement: 02/08/21 Potential to Achieve Goals: Fair Progress towards PT goals: Progressing toward goals    Frequency    Min 2X/week      PT Plan Current plan remains appropriate    Co-evaluation              AM-PAC PT "6 Clicks" Mobility   Outcome Measure  Help needed turning from your back to your side while in a flat bed without using bedrails?: A Little Help needed moving from lying on your back to sitting on the side of a flat bed without  using bedrails?: A Little Help needed moving to and from a bed to a chair (including a wheelchair)?: A Little Help needed standing up from a chair using your arms (e.g., wheelchair or bedside chair)?: A Lot Help needed to walk in hospital room?: A Lot Help needed climbing 3-5 steps with a railing? : A Lot 6 Click Score: 15    End of Session   Activity Tolerance: Patient limited by fatigue Patient left: in bed;with call bell/phone within reach Nurse Communication: Mobility status PT Visit Diagnosis: Unsteadiness on feet (R26.81);Muscle weakness (generalized) (M62.81)     Time: 7893-8101 PT Time Calculation (min) (ACUTE ONLY): 29 min  Charges:  $Therapeutic Activity: 23-37 mins                     [name], [place] Pager [phone] Acute Rehab  Allena Katz 01/28/2021, 12:10 PM

## 2021-01-29 ENCOUNTER — Inpatient Hospital Stay (HOSPITAL_COMMUNITY): Payer: Medicare HMO

## 2021-01-29 LAB — TYPE AND SCREEN
ABO/RH(D): O POS
Antibody Screen: NEGATIVE
Unit division: 0

## 2021-01-29 LAB — CBC
HCT: 25.5 % — ABNORMAL LOW (ref 39.0–52.0)
Hemoglobin: 7.8 g/dL — ABNORMAL LOW (ref 13.0–17.0)
MCH: 24.5 pg — ABNORMAL LOW (ref 26.0–34.0)
MCHC: 30.6 g/dL (ref 30.0–36.0)
MCV: 79.9 fL — ABNORMAL LOW (ref 80.0–100.0)
Platelets: 110 10*3/uL — ABNORMAL LOW (ref 150–400)
RBC: 3.19 MIL/uL — ABNORMAL LOW (ref 4.22–5.81)
RDW: 21.8 % — ABNORMAL HIGH (ref 11.5–15.5)
WBC: 7.8 10*3/uL (ref 4.0–10.5)
nRBC: 0 % (ref 0.0–0.2)

## 2021-01-29 LAB — COMPREHENSIVE METABOLIC PANEL
ALT: 48 U/L — ABNORMAL HIGH (ref 0–44)
AST: 53 U/L — ABNORMAL HIGH (ref 15–41)
Albumin: 2.4 g/dL — ABNORMAL LOW (ref 3.5–5.0)
Alkaline Phosphatase: 388 U/L — ABNORMAL HIGH (ref 38–126)
Anion gap: 7 (ref 5–15)
BUN: 19 mg/dL (ref 8–23)
CO2: 20 mmol/L — ABNORMAL LOW (ref 22–32)
Calcium: 7.5 mg/dL — ABNORMAL LOW (ref 8.9–10.3)
Chloride: 109 mmol/L (ref 98–111)
Creatinine, Ser: 1.25 mg/dL — ABNORMAL HIGH (ref 0.61–1.24)
GFR, Estimated: >60 mL/min (ref >60)
Glucose, Bld: 110 mg/dL — ABNORMAL HIGH (ref 70–99)
Potassium: 3.1 mmol/L — ABNORMAL LOW (ref 3.5–5.1)
Sodium: 136 mmol/L (ref 135–145)
Total Bilirubin: 1.2 mg/dL (ref 0.3–1.2)
Total Protein: 5.3 g/dL — ABNORMAL LOW (ref 6.5–8.1)

## 2021-01-29 LAB — BPAM RBC
Blood Product Expiration Date: 202206292359
ISSUE DATE / TIME: 202205311121
Unit Type and Rh: 5100

## 2021-01-29 LAB — PROTIME-INR
INR: 1.3 — ABNORMAL HIGH (ref 0.8–1.2)
Prothrombin Time: 16.1 seconds — ABNORMAL HIGH (ref 11.4–15.2)

## 2021-01-29 LAB — GLUCOSE, CAPILLARY
Glucose-Capillary: 165 mg/dL — ABNORMAL HIGH (ref 70–99)
Glucose-Capillary: 143 mg/dL — ABNORMAL HIGH (ref 70–99)
Glucose-Capillary: 166 mg/dL — ABNORMAL HIGH (ref 70–99)
Glucose-Capillary: 170 mg/dL — ABNORMAL HIGH (ref 70–99)

## 2021-01-29 LAB — PROCALCITONIN: Procalcitonin: 1.25 ng/mL

## 2021-01-29 MED ORDER — FUROSEMIDE 40 MG PO TABS
40.0000 mg | ORAL_TABLET | Freq: Every day | ORAL | Status: DC
  Administered 2021-01-29 – 2021-01-30 (×2): 40 mg via ORAL
  Filled 2021-01-29 (×2): qty 1

## 2021-01-29 MED ORDER — POTASSIUM CHLORIDE CRYS ER 20 MEQ PO TBCR
40.0000 meq | EXTENDED_RELEASE_TABLET | ORAL | Status: AC
  Administered 2021-01-29 (×3): 40 meq via ORAL
  Filled 2021-01-29 (×3): qty 2

## 2021-01-29 NOTE — Progress Notes (Signed)
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  At baseline patient is typically alert and oriented x4 and ambulatory.  Symptoms onset roughly 3 days ago with associated confusion, nausea, chills, weakness and vomiting.  Apparently patient was not taking his lactulose at home.  Patient recently discharged 5 days prior after an 11-day hospitalization due to symptomatic anemia in the setting of recurrent GI bleeding requiring 5 unit PRBC and TIPS procedure with embolization of gastric varices by IR on 01/13/2021.  Subjective:  Patient reports generalized weakness and fatigue, reports appetite has improved, he does report multiple loose bowel movements yesterday.    Assessment & Plan:   Principal Problem:   Hepatic encephalopathy ([place]) Active Problems:   Type 2 diabetes with complication (HCC)   GERD without esophagitis   Hyperlipidemia with target LDL less than 70   CKD (chronic kidney disease), stage III (HCC)   Alcoholic cirrhosis of liver with ascites (HCC)   Hepatocellular carcinoma (HCC)   Acute hepatic encephalopathy secondary to medication noncompliance, POA -Ammonia downtrending appropriately, mental status improving. -status post TIPS procedure for gastric varices.   -CT head unremarkable  - patient with multiple episodes of diarrhea, I have lowered his lactulose to 20 g oral 3 times daily, continue with rifaximin. -GI consulted, will await for further  recommendations.  Normocytic Anemia, with Hemoccult positive -Continue with supportive transfusions, Recently hospitalized and status posttransfusion 5 unit PRBCs.   -Protonix 13m PO q12h  Hepatic cirrhosis Gastric/esophageal varices Portal gastropathy EGD 5/13 by Dr. SMichail Sermonnotable for small distal esophagus varices, erosions/erythema gastric antrum, gastric varices within the fundus, mild portal hypertensive gastropathy.  Patient underwent TIPS and embolization of gastric varices by IR on 5/18 -Patient with fever of 100.9 on 5/31, procalcitonin is elevated, started on Rocephin, abdomen is nontender, chest x-ray with no pneumonia, UA with no evidence of UTI as well. -Patient with fever, alk phos trending up, ultrasound abdomen showing with no evidence of ascites for paracentesis, patient is improving with antibiotics, he is afebrile, procalcitonin is trending down, discussed with GI, plan for CT abdomen pelvis without contrast to evaluate for his recent TIPS procedure or any source of infection.  Chronic diastolic congestive heart failure TTE with LVEF 627-78% grade 3 diastolic dysfunction.  On torsemide and spironolactone outpatient -has been resumed, remains with significant lower extremity edema. -Strict I's and O's and daily weights --Monitor renal function closely daily -Aldactone has been stopped given there is no ascites, he will be started on low-dose Lasix instead of torsemide.  Lactic acidosis, likely multifactorial Etiology unclear, but suspect from dehydration/hepatic encephalopathy.  No signs of focal infectious process, afebrile without leukocytosis, urinalysis unremarkable, chest x-ray without focal consolidation. Started empirically on antibiotics by admitting physician with vancomycin and cefepime; discontinued as his etiology of lactic acidosis related to his encephalopathy from rather than infectious cause.  CKD stage IIIa - Baseline creatinine 1.2.   -Creatinine  within normal limits today unclear if baseline is truly stage 3A -given GFR is greater than 60 at  this point  Type 2 diabetes mellitus On glimepiride and metformin outpatient. --Hold oral hypoglycemics while inpatient --SSI for coverage --CBGs before every meal/at bedtime  Hypokalemia -repleted  HLD: Consider restarting Lipitor on discharge  Morbid obesity Body mass index is 33.99 kg/m.  Discussed with patient needs for aggressive lifestyle changes/weight loss as this complicates all facets of care. Outpatient follow-up with PCP.   Weakness/deconditioning: - PT/OT evaluation  DVT prophylaxis: SCDs, chemical DVT prophylaxis contraindicated with positive FOBT   Code Status: Full Code Family Communication: No family present at bedside this am  Disposition Plan:  Level of care: Telemetry Medical Status is: Observation  The patient will require care spanning > 2 midnights and should be moved to inpatient because: Altered mental status, Ongoing diagnostic testing needed not appropriate for outpatient work up, Unsafe d/c plan, IV treatments appropriate due to intensity of illness or inability to take PO and Inpatient level of care appropriate due to severity of illness  Dispo: The patient is from: Home              Anticipated d/c is to: Home              Patient currently is not medically stable to d/c.   Difficult to place patient No    Consultants:   EDP discussed care with GI, Dr. Cristina Gong  Procedures:   None  Antimicrobials:   Vancomycin 5/27 - 5/28  Cefepime 5/27 - 5/28  Metronidazole 5/27 - 5/27    Objective: Vitals:   01/28/21 0845 01/28/21 2015 01/29/21 0450 01/29/21 0918  BP:  (!) 108/54 (!) 122/53   Pulse:  68 64 70  Resp:  '18 17 16  ' Temp:  98.4 F (36.9 C) 98.2 F (36.8 C)   TempSrc:  Axillary Axillary   SpO2: 94% 100% 100% 98%  Weight:      Height:        Intake/Output Summary (Last 24 hours) at 01/29/2021 1547 Last data filed at 01/29/2021  1347 Gross per 24 hour  Intake 841 ml  Output 402 ml  Net 439 ml   Filed Weights   01/23/21 0955 01/24/21 0457  Weight: 103.4 kg 101.4 kg    Examination:  Awake Alert, Oriented X 3, No new F.N deficits, Normal affect, frail, deconditioned Symmetrical Chest wall movement, Good air movement bilaterally, CTAB RRR,No Gallops,Rubs or new Murmurs, No Parasternal Heave +ve B.Sounds, Abd Soft, No tenderness, No rebound - guarding or rigidity. No Cyanosis, Clubbing, +2 edema, No new Rash or bruise     Data Reviewed: I have personally reviewed following labs and imaging studies  CBC: Recent Labs  Lab 01/22/21 1558 01/22/21 1808 01/27/21 0045 01/27/21 1748 01/27/21 2329 01/28/21 0132 01/29/21 0445  WBC 8.4   < > 9.3 17.3* 13.7* 11.9* 7.8  NEUTROABS 7.7  --   --   --   --   --   --   HGB 7.4*   < > 8.3* 7.9* 8.8* 7.7* 7.8*  HCT 25.2*   < > 27.1* 26.1* 28.9* 25.0* 25.5*  MCV 80.3   < > 80.7 80.1 79.8* 79.1* 79.9*  PLT 108*   < > 132* 105* 62* 104* 110*   < > = values in this interval not displayed.   Basic Metabolic Panel: Recent Labs  Lab 01/24/21 0033 01/24/21 0934 01/25/21 0036 01/26/21 0107 01/27/21 0045 01/28/21 0132 01/29/21 0445  NA 146*  --  147* 137 137 135 136  K 2.8*   < >  3.6 3.1* 3.6 3.3* 3.1*  CL 115*  --  118* 110 108 108 109  CO2 20*  --  21* 20* 18* 21* 20*  GLUCOSE 160*  --  134* 144* 135* 121* 110*  BUN 21  --  24* '21 17 21 19  ' CREATININE 1.36*  --  1.24 1.00 1.01 1.39* 1.25*  CALCIUM 8.4*  --  8.4* 8.3* 8.2* 7.8* 7.5*  MG 1.9  --  2.2 2.0  --   --   --    < > = values in this interval not displayed.   GFR: Estimated Creatinine Clearance: 59.8 mL/min (A) (by C-G formula based on SCr of 1.25 mg/dL (H)). Liver Function Tests: Recent Labs  Lab 01/24/21 0033 01/25/21 0036 01/26/21 0107 01/28/21 0132 01/29/21 0445  AST 32 42* 82* 57* 53*  ALT 19 22 48* 46* 48*  ALKPHOS 113 124 356* 415* 388*  BILITOT 2.1* 2.0* 1.9* 1.8* 1.2  PROT 6.0* 5.6*  5.3* 5.3* 5.3*  ALBUMIN 2.9* 2.7* 2.6* 2.4* 2.4*   No results for input(s): LIPASE, AMYLASE in the last 168 hours. Recent Labs  Lab 01/22/21 1610 01/23/21 0350 01/24/21 0033 01/25/21 0730 01/26/21 0107  AMMONIA 69* 153* 83* 16 28   Coagulation Profile: Recent Labs  Lab 01/22/21 1558 01/29/21 0445  INR 1.3* 1.3*   Cardiac Enzymes: No results for input(s): CKTOTAL, CKMB, CKMBINDEX, TROPONINI in the last 168 hours. BNP (last 3 results) No results for input(s): PROBNP in the last 8760 hours. HbA1C: No results for input(s): HGBA1C in the last 72 hours. CBG: Recent Labs  Lab 01/28/21 1208 01/28/21 1716 01/28/21 2017 01/29/21 0811 01/29/21 1153  GLUCAP 139* 143* 177* 165* 143*   Lipid Profile: No results for input(s): CHOL, HDL, LDLCALC, TRIG, CHOLHDL, LDLDIRECT in the last 72 hours. Thyroid Function Tests: No results for input(s): TSH, T4TOTAL, FREET4, T3FREE, THYROIDAB in the last 72 hours. Anemia Panel: No results for input(s): VITAMINB12, FOLATE, FERRITIN, TIBC, IRON, RETICCTPCT in the last 72 hours. Sepsis Labs: Recent Labs  Lab 01/22/21 1758 01/23/21 0350 01/23/21 1057 01/25/21 0036 01/26/21 0107 01/27/21 1748 01/28/21 0132 01/29/21 0445  PROCALCITON  --   --  0.16  --   --  2.17 2.48 1.25  LATICACIDVEN 2.5* 3.2*  --  2.9* 1.9  --   --   --     Recent Results (from the past 240 hour(s))  Blood Culture (routine x 2)     Status: Abnormal   Collection Time: 01/22/21  2:08 PM   Specimen: BLOOD  Result Value Ref Range Status   Specimen Description BLOOD RIGHT ANTECUBITAL  Final   Special Requests   Final    BOTTLES DRAWN AEROBIC AND ANAEROBIC Blood Culture adequate volume   Culture  Setup Time   Final    GRAM POSITIVE COCCI IN CLUSTERS IN BOTH AEROBIC AND ANAEROBIC BOTTLES CRITICAL RESULT CALLED TO, READ BACK BY AND VERIFIED WITH: KIM HURTH PHARMD '@1949'  01/23/21 EB    Culture (A)  Final    STAPHYLOCOCCUS EPIDERMIDIS THE SIGNIFICANCE OF ISOLATING THIS  ORGANISM FROM A SINGLE SET OF BLOOD CULTURES WHEN MULTIPLE SETS ARE DRAWN IS UNCERTAIN. PLEASE NOTIFY THE MICROBIOLOGY DEPARTMENT WITHIN ONE WEEK IF SPECIATION AND SENSITIVITIES ARE REQUIRED. Performed at [facility], [place] [address], [place], [place] [address]    Report Status 01/25/2021 FINAL  Final  Blood Culture ID Panel (Reflexed)     Status: Abnormal   Collection Time: 01/22/21  2:08 PM  Result Value Ref Range Status   Enterococcus faecalis NOT DETECTED NOT DETECTED Final   Enterococcus Faecium NOT DETECTED NOT DETECTED Final   Listeria monocytogenes NOT DETECTED NOT DETECTED Final   Staphylococcus species DETECTED (A) NOT DETECTED Final    Comment: CRITICAL RESULT CALLED TO, READ BACK BY AND VERIFIED WITH: Gorden Harms PHARMD '@1949'  01/23/21 EB    Staphylococcus aureus (BCID) NOT DETECTED NOT DETECTED Final   Staphylococcus epidermidis DETECTED (A) NOT DETECTED Final    Comment: Methicillin (oxacillin) resistant coagulase negative staphylococcus. Possible blood culture contaminant (unless isolated from more than one blood culture draw or clinical case suggests pathogenicity). No antibiotic treatment is indicated for blood  culture contaminants. CRITICAL RESULT CALLED TO, READ BACK BY AND VERIFIED WITH: Gorden Harms PHARMD '@1949'  01/23/21 EB    Staphylococcus lugdunensis NOT DETECTED NOT DETECTED Final   Streptococcus species NOT DETECTED NOT DETECTED Final   Streptococcus agalactiae NOT DETECTED NOT DETECTED Final   Streptococcus pneumoniae NOT DETECTED NOT DETECTED Final   Streptococcus pyogenes NOT DETECTED NOT DETECTED Final   A.calcoaceticus-baumannii NOT DETECTED NOT DETECTED Final   Bacteroides fragilis NOT DETECTED NOT DETECTED Final   Enterobacterales NOT DETECTED NOT DETECTED Final   Enterobacter cloacae complex NOT DETECTED NOT DETECTED Final   Escherichia coli NOT DETECTED NOT DETECTED Final   Klebsiella aerogenes NOT DETECTED NOT DETECTED Final   Klebsiella oxytoca NOT  DETECTED NOT DETECTED Final   Klebsiella pneumoniae NOT DETECTED NOT DETECTED Final   Proteus species NOT DETECTED NOT DETECTED Final   Salmonella species NOT DETECTED NOT DETECTED Final   Serratia marcescens NOT DETECTED NOT DETECTED Final   Haemophilus influenzae NOT DETECTED NOT DETECTED Final   Neisseria meningitidis NOT DETECTED NOT DETECTED Final   Pseudomonas aeruginosa NOT DETECTED NOT DETECTED Final   Stenotrophomonas maltophilia NOT DETECTED NOT DETECTED Final   Candida albicans NOT DETECTED NOT DETECTED Final   Candida auris NOT DETECTED NOT DETECTED Final   Candida glabrata NOT DETECTED NOT DETECTED Final   Candida krusei NOT DETECTED NOT DETECTED Final   Candida parapsilosis NOT DETECTED NOT DETECTED Final   Candida tropicalis NOT DETECTED NOT DETECTED Final   Cryptococcus neoformans/gattii NOT DETECTED NOT DETECTED Final   Methicillin resistance mecA/C DETECTED (A) NOT DETECTED Final    Comment: CRITICAL RESULT CALLED TO, READ BACK BY AND VERIFIED WITH: Gorden Harms PHARMD '@1949'  01/23/21 EB Performed at [facility], [address] [place], [place] [address]   Urine culture     Status: None   Collection Time: 01/22/21  3:58 PM   Specimen: In/Out Cath Urine  Result Value Ref Range Status   Specimen Description IN/OUT CATH URINE  Final   Special Requests NONE  Final   Culture   Final    NO GROWTH Performed at [facility], [place] [address], [place], [place] [address]    Report Status 01/26/2021 FINAL  Final  Blood Culture (routine x 2)     Status: None   Collection Time: 01/22/21  4:03 PM   Specimen: BLOOD LEFT WRIST  Result Value Ref Range Status   Specimen Description BLOOD LEFT WRIST  Final   Special Requests   Final    BOTTLES DRAWN AEROBIC AND ANAEROBIC Blood Culture adequate volume   Culture   Final    NO GROWTH 5 DAYS Performed at [facility], [place] [address], [place], [place] [address]    Report Status 01/27/2021 FINAL  Final  Resp Panel by  RT-PCR (Flu A&B, Covid) Nasopharyngeal Swab     Status: None   Collection Time: 01/22/21  4:50 PM   Specimen: Nasopharyngeal Swab; Nasopharyngeal(NP) swabs in vial transport medium  Result Value Ref Range Status   SARS Coronavirus 2 by RT PCR NEGATIVE NEGATIVE Final    Comment: (NOTE) SARS-CoV-2 target nucleic acids are NOT DETECTED.  The SARS-CoV-2 RNA is generally detectable in upper respiratory specimens during the acute phase of infection. The lowest concentration of SARS-CoV-2 viral copies this assay can detect is 138 copies/mL. A negative result does not preclude SARS-Cov-2 infection and should not be used as the sole basis for treatment or other patient management decisions. A negative result may occur with  improper specimen collection/handling, submission of specimen other than nasopharyngeal swab, presence of viral mutation(s) within the areas targeted by this assay, and inadequate number of viral copies(<138 copies/mL). A negative result must be combined with clinical observations, patient history, and epidemiological information. The expected result is Negative.  Fact Sheet for Patients:  EntrepreneurPulse.com.au  Fact Sheet for Healthcare Providers:  IncredibleEmployment.be  This test is no t yet approved or cleared by the [place] FDA and  has been authorized for detection and/or diagnosis of SARS-CoV-2 by FDA under an Emergency Use Authorization (EUA). This EUA will remain  in effect (meaning this test can be used) for the duration of the COVID-19 declaration under Section 564(b)(1) of the Act, 21 U.S.C.section 360bbb-3(b)(1), unless the authorization is terminated  or revoked sooner.       Influenza A by PCR NEGATIVE NEGATIVE Final   Influenza B by PCR NEGATIVE NEGATIVE Final    Comment: (NOTE) The Xpert Xpress SARS-CoV-2/FLU/RSV plus assay is intended as an aid in the diagnosis of influenza from Nasopharyngeal swab  specimens and should not be used as a sole basis for treatment. Nasal washings and aspirates are unacceptable for Xpert Xpress SARS-CoV-2/FLU/RSV testing.  Fact Sheet for Patients: EntrepreneurPulse.com.au  Fact Sheet for Healthcare Providers: IncredibleEmployment.be  This test is not yet approved or cleared by the [place] FDA and has been authorized for detection and/or diagnosis of SARS-CoV-2 by FDA under an Emergency Use Authorization (EUA). This EUA will remain in effect (meaning this test can be used) for the duration of the COVID-19 declaration under Section 564(b)(1) of the Act, 21 U.S.C. section 360bbb-3(b)(1), unless the authorization is terminated or revoked.  Performed at [facility], [place] [address] [place], [place] [address]   MRSA PCR Screening     Status: None   Collection Time: 01/24/21  5:04 AM   Specimen: Nasal Mucosa; Nasopharyngeal  Result Value Ref Range Status   MRSA by PCR NEGATIVE NEGATIVE Final    Comment:        The GeneXpert MRSA Assay (FDA approved for NASAL specimens only), is one component of a comprehensive MRSA colonization surveillance program. It is not intended to diagnose MRSA infection nor to guide or monitor treatment for MRSA infections. Performed at [facility], [place] [address] [place], [place] [address]   Culture, blood (routine x 2)     Status: None (Preliminary result)   Collection Time: 01/27/21  5:48 PM   Specimen: BLOOD  Result Value Ref Range Status   Specimen Description BLOOD SITE NOT SPECIFIED  Final   Special Requests   Final    BOTTLES DRAWN AEROBIC AND ANAEROBIC Blood Culture adequate volume   Culture   Final    NO GROWTH 2 DAYS Performed at [facility], [place] [address], [place], [place] [address]    Report Status PENDING  Incomplete  Culture, blood (routine x 2)     Status: None (Preliminary result)   Collection Time: 01/27/21  5:48 PM   Specimen:  BLOOD  Result Value Ref Range Status   Specimen Description BLOOD SITE NOT SPECIFIED  Final   Special Requests   Final    BOTTLES DRAWN AEROBIC AND ANAEROBIC Blood Culture adequate volume   Culture   Final    NO GROWTH 2 DAYS Performed at [facility], [address] [place], [place] [address]    Report Status PENDING  Incomplete         Radiology Studies: DG Chest Port 1 View  Result Date: 01/27/2021 CLINICAL DATA:  Fever. EXAM: PORTABLE CHEST 1 VIEW COMPARISON:  One-view chest x-ray 01/22/2021 FINDINGS: Heart is enlarged. Increasing interstitial and airspace disease is present bilaterally. Postoperative changes noted. IMPRESSION: Cardiomegaly with increasing interstitial and airspace disease compatible with edema versus infection. Electronically Signed   By: San Morelle M.D.   On: 01/27/2021 16:42   IR ABDOMEN US LIMITED  Result Date: 01/28/2021 CLINICAL DATA:  Assessment for ascites. History of cirrhosis and recent TIPS procedure. Encephalopathy. EXAM: LIMITED ABDOMEN ULTRASOUND FOR ASCITES TECHNIQUE: Limited ultrasound survey for ascites was performed in all four abdominal quadrants. COMPARISON:  None. FINDINGS: Four quadrant survey of the peritoneal cavity by ultrasound demonstrates no visualized ascites. IMPRESSION: No ascites present. Electronically Signed   By: Aletta Edouard M.D.   On: 01/28/2021 16:15    Scheduled Meds: . chlorhexidine  15 mL Mouth Rinse BID  . insulin aspart  0-5 Units Subcutaneous QHS  . insulin aspart  0-9 Units Subcutaneous TID WC  . lactulose  20 g Oral TID  . mouth rinse  15 mL Mouth Rinse q12n4p  . pantoprazole  40 mg Oral BID  . potassium chloride  40 mEq Oral Q4H  . rifaximin  550 mg Oral BID  . spironolactone  25 mg Oral Daily  . umeclidinium-vilanterol  1 puff Inhalation Daily   Continuous Infusions: . sodium chloride    . cefTRIAXone (ROCEPHIN)  IV 2 g (01/29/21 1437)     LOS: 6 days    Phillips Climes, MD [facility] Available via Epic secure chat 7am-7pm After these hours, please refer to coverage provider listed on [email or web address] 01/29/2021, 3:47 PM

## 2021-01-29 NOTE — Progress Notes (Addendum)
[facility] Gastroenterology Progress Note  [name] 75 y.o. February 18, 1946  CC: Encephalopathy, fever, elevated LFTs   Subjective: Patient seen and examined at bedside.  Feeling better.  Denies any abdominal pain, nausea and vomiting.  Afebrile this morning  ROS : Negative for chest pain and shortness of breath   Objective: Vital signs in last 24 hours: Vitals:   01/28/21 2015 01/29/21 0450  BP: (!) 108/54 (!) 122/53  Pulse: 68 64  Resp: 18 17  Temp: 98.4 F (36.9 C) 98.2 F (36.8 C)  SpO2: 100% 100%    Physical Exam:  General:  Alert, cooperative, no distress, appears stated age  Head:  Normocephalic, without obvious abnormality, atraumatic  Eyes:  , EOM's intact,   Lungs:    No visible respiratory distress  Heart:  Regular rate and rhythm, S1, S2 normal  Abdomen:   Soft, non-tender, nondistended, bowel sounds present  Extremities:  Edema noted       Lab Results: Recent Labs    01/28/21 0132 01/29/21 0445  NA 135 136  K 3.3* 3.1*  CL 108 109  CO2 21* 20*  GLUCOSE 121* 110*  BUN 21 19  CREATININE 1.39* 1.25*  CALCIUM 7.8* 7.5*   Recent Labs    01/28/21 0132 01/29/21 0445  AST 57* 53*  ALT 46* 48*  ALKPHOS 415* 388*  BILITOT 1.8* 1.2  PROT 5.3* 5.3*  ALBUMIN 2.4* 2.4*   Recent Labs    01/28/21 0132 01/29/21 0445  WBC 11.9* 7.8  HGB 7.7* 7.8*  HCT 25.0* 25.5*  MCV 79.1* 79.9*  PLT 104* 110*   Recent Labs    01/29/21 0445  LABPROT 16.1*  INR 1.3*      Assessment/Plan: -75 year old patient with decompensated hepatic cirrhosis complicated by [facility] and with variceal bleeding.  S/p TIPS and coil embolization of gastric varices on Jan 13, 2021.  Admitted with encephalopathy.  Now having fever and elevated LFTs. ??  Recommendation of cirrhosis versus SBP.  Recommendations ---------------------- -Ultrasound yesterday showed no evidence of ascites for paracentesis. -Patient is feeling better on antibiotics.  White counts and LFTs are  improving. -CT abdomen pelvis today. (Without contrast because of national shortage of contrast medium)  -If CT scan negative, okay to discharge home tomorrow on oral antibiotics (ciprofloxacin or Bactrim)  for 7 days. -He does not have ascites but still has lower extremity edema.  His  Torsemide  was discontinued by me yesterday because of elevated creatinine.  His kidney functions are improving.  May consider resuming low-dose diuretics on discharge for lower extremity edema.  He does not have ascites and also we can consider changing spironolactone to Lasix on discharge.  Discussed with hospitalist. -GI will follow    Otis Brace MD, [place] 01/29/2021, 9:15 AM  Contact #  [phone]

## 2021-01-29 NOTE — Progress Notes (Signed)
While educating patient about the importance of checking blood sugars and administration of insulin this RN asked the patient if he checks his CBG and administers at home.  He said, "sometimes, it depends if I can get the supplies and a prescription."  Forwarded information to Dr. [facility].

## 2021-01-29 NOTE — Progress Notes (Addendum)
Occupational Therapy Treatment Patient Details Name: [name] MRN: [id number] DOB: 1945/09/13 Today's Date: 01/29/2021    History of present illness Pt is a 75 y/o male admitted secondary to [place]. Likely secondary to hepatic encephalopathy. Pt with recent admission in early 12/2020 for GI bleed. PMH includes CAD s/p CABG, dCHF, HTN, alcoholic cirrhosis, and hepatocellular carcinoma.   OT comments  Pt. Seen for skilled OT treatment session.  Focus of session was lb dressing eob. Pt. Able to complete donning of socks min a.  Required assistance with RLE vs. LLE.  States wife is available to assist with LB dressing as needed and helped prior to this admission with this task.  Pt. Demonstrated bed mobility in/out and able to scoot length of bed prior to lying down.  Pending procedure so pt. Opted back to bed to be ready.  Eager for d/c home when ready.    Follow Up Recommendations  Home health OT;Supervision/Assistance - 24 hour;SNF    Equipment Recommendations  3 in 1 bedside commode    Recommendations for Other Services      Precautions / Restrictions Precautions Precautions: Fall;Other (comment) Precaution Comments: reports incontinent       Mobility Bed Mobility Overal bed mobility: Needs Assistance Bed Mobility: Supine to Sit     Supine to sit: Supervision;HOB elevated     General bed mobility comments: use of bed rails with HOB elevated. increased time and effort.    Transfers                 General transfer comment: seated eob able to scoot x2 towards hob in preparation for back to bed.  delcined oob or need to use b.room, pending ct scan and pt. wanting to be in/near bed in case they came to get him for the procedure    Balance                                           ADL either performed or assessed with clinical judgement   ADL Overall ADL's : Needs assistance/impaired                     Lower Body Dressing: Minimal  assistance;Sitting/lateral leans Lower Body Dressing Details (indicate cue type and reason): pt. seated eob able to turn in bed and bring each LE to bed, donned LLE without assistance but did require assistance with RLE.  once placed over toes able to pull up over heel min a.  also reports his wife assists as needed with LB adls               General ADL Comments: performed bed mobility in preparation for eob adls and increasing mobility.  min a for lb dressing, reports wife also asssists with lb adls prn     Vision       Perception     Praxis      Cognition Arousal/Alertness: Lethargic Behavior During Therapy: Flat affect Overall Cognitive Status: No family/caregiver present to determine baseline cognitive functioning                                 General Comments: slow processing noted        Exercises     Shoulder Instructions       General Comments  Pertinent Vitals/ Pain       Pain Assessment: No/denies pain  Home Living                                          Prior Functioning/Environment              Frequency  Min 2X/week        Progress Toward Goals  OT Goals(current goals can now be found in the care plan section)  Progress towards OT goals: Progressing toward goals     Plan      Co-evaluation                 AM-PAC OT "6 Clicks" Daily Activity     Outcome Measure   Help from another person eating meals?: None Help from another person taking care of personal grooming?: A Little Help from another person toileting, which includes using toliet, bedpan, or urinal?: A Lot Help from another person bathing (including washing, rinsing, drying)?: A Lot Help from another person to put on and taking off regular upper body clothing?: A Little Help from another person to put on and taking off regular lower body clothing?: A Lot 6 Click Score: 16    End of Session    OT Visit Diagnosis:  Unsteadiness on feet (R26.81);Muscle weakness (generalized) (M62.81);Other symptoms and signs involving cognitive function   Activity Tolerance Patient tolerated treatment well   Patient Left in bed;with call bell/phone within reach;with bed alarm set   Nurse Communication  let rn know pt. Requesting a soda        Time: 1041-1049 OT Time Calculation (min): 8 min  Charges: OT General Charges $OT Visit: 1 Visit OT Treatments $Self Care/Home Management : 8-22 mins  Sonia Baller, COTA/L Acute Rehabilitation [phone]   Clearnce Sorrel  01/29/2021, 12:17 PM

## 2021-01-30 ENCOUNTER — Encounter (HOSPITAL_COMMUNITY): Payer: Self-pay | Admitting: Internal Medicine

## 2021-01-30 DIAGNOSIS — D649 Anemia, unspecified: Secondary | ICD-10-CM | POA: Diagnosis not present

## 2021-01-30 DIAGNOSIS — R509 Fever, unspecified: Secondary | ICD-10-CM | POA: Diagnosis not present

## 2021-01-30 DIAGNOSIS — K7290 Hepatic failure, unspecified without coma: Secondary | ICD-10-CM | POA: Diagnosis not present

## 2021-01-30 LAB — COMPREHENSIVE METABOLIC PANEL
ALT: 65 U/L — ABNORMAL HIGH (ref 0–44)
AST: 93 U/L — ABNORMAL HIGH (ref 15–41)
Albumin: 2.5 g/dL — ABNORMAL LOW (ref 3.5–5.0)
Alkaline Phosphatase: 660 U/L — ABNORMAL HIGH (ref 38–126)
Anion gap: 8 (ref 5–15)
BUN: 16 mg/dL (ref 8–23)
CO2: 18 mmol/L — ABNORMAL LOW (ref 22–32)
Calcium: 7.9 mg/dL — ABNORMAL LOW (ref 8.9–10.3)
Chloride: 107 mmol/L (ref 98–111)
Creatinine, Ser: 1.12 mg/dL (ref 0.61–1.24)
GFR, Estimated: >60 mL/min (ref >60)
Glucose, Bld: 138 mg/dL — ABNORMAL HIGH (ref 70–99)
Potassium: 4.3 mmol/L (ref 3.5–5.1)
Sodium: 133 mmol/L — ABNORMAL LOW (ref 135–145)
Total Bilirubin: 1.1 mg/dL (ref 0.3–1.2)
Total Protein: 5.6 g/dL — ABNORMAL LOW (ref 6.5–8.1)

## 2021-01-30 LAB — CBC
HCT: 25.8 % — ABNORMAL LOW (ref 39.0–52.0)
Hemoglobin: 7.7 g/dL — ABNORMAL LOW (ref 13.0–17.0)
MCH: 23.7 pg — ABNORMAL LOW (ref 26.0–34.0)
MCHC: 29.8 g/dL — ABNORMAL LOW (ref 30.0–36.0)
MCV: 79.4 fL — ABNORMAL LOW (ref 80.0–100.0)
Platelets: 128 10*3/uL — ABNORMAL LOW (ref 150–400)
RBC: 3.25 MIL/uL — ABNORMAL LOW (ref 4.22–5.81)
RDW: 21.6 % — ABNORMAL HIGH (ref 11.5–15.5)
WBC: 6.9 10*3/uL (ref 4.0–10.5)
nRBC: 0 % (ref 0.0–0.2)

## 2021-01-30 LAB — GLUCOSE, CAPILLARY
Glucose-Capillary: 146 mg/dL — ABNORMAL HIGH (ref 70–99)
Glucose-Capillary: 152 mg/dL — ABNORMAL HIGH (ref 70–99)

## 2021-01-30 MED ORDER — LACTULOSE 10 GM/15ML PO SOLN: 20.0000 g | Freq: Three times a day (TID) | ORAL | 1 refills | Status: DC

## 2021-01-30 MED ORDER — FUROSEMIDE 40 MG PO TABS: 40.0000 mg | ORAL_TABLET | Freq: Every day | ORAL | 1 refills | Status: DC

## 2021-01-30 MED ORDER — AMOXICILLIN-POT CLAVULANATE 875-125 MG PO TABS: 1.0000 | ORAL_TABLET | Freq: Two times a day (BID) | ORAL | 0 refills | Status: AC

## 2021-01-30 NOTE — Discharge Summary (Signed)
Physician Discharge Summary  [name] VFI:[id number] DOB: 11-Jan-1946 DOA: 01/22/2021  PCP: John Koch, MD  Admit date: 01/22/2021 Discharge date: 01/30/2021  Admitted From: Home Disposition:  Home   Recommendations for Outpatient Follow-up:  1. Follow up with PCP in 1-2 weeks 2. Please follow with GI Dr. Therisa [name] 1 to 2 weeks to repeat LFTs.   Home Health:YES Equipment/Devices:NO  Discharge ConditionStable CODE STATUS:FULL Diet recommendation: Heart Healthy / Carb Modified   Brief/Interim Summary:  [name] is a 75 year old male with past medical history significant for hepatal cellular carcinoma treatment November 2021, hepatic cirrhosis, CKD stage IIIa, PUD, GI bleed, gout, HLD, IDA, OSA not on CPAP, type 2 diabetes mellitus, essential hypertension, chronic diastolic congestive heart failure, osteoarthritis, chronic low back pain who presents to [name] ED on 5/27 via EMS for altered mental status.  At baseline patient is typically alert and oriented x4 and ambulatory.  Symptoms onset roughly 3 days ago with associated confusion, nausea, chills, weakness and vomiting.  Apparently patient was not taking his lactulose at home.  Patient recently discharged 5 days prior after an 11-day hospitalization due to symptomatic anemia in the setting of recurrent GI bleeding requiring 5 unit PRBC and TIPS procedure with embolization of gastric varices by IR on 01/13/2021.   Acute hepatic encephalopathy secondary to medication noncompliance, POA -Ammonia downtrending appropriately, mental status improving.  To baseline, ammonia within normal limit -status post TIPS procedure for gastric varices.   -CT head unremarkable  -This is due to noncompliance with lactulose as an outpatient as patient did not take his lactulose yet, have discussed with patient and son, about importance of compliance, and they will adjust his lactulose dosing to target 3-4 bowel movements per  day   Normocytic Anemia, with Hemoccult positive -Continue with supportive transfusions, Recently hospitalized and status posttransfusion 5 unit PRBCs.   -Did require 1 unit PRBC transfusion 6/1, hemoglobin has remained stable since.  Hepatic cirrhosis Gastric/esophageal varices Portal gastropathy EGD 5/13 by Dr. Michail [name] notable for small distal esophagus varices, erosions/erythema gastric antrum, gastric varices within the fundus, mild portal hypertensive gastropathy.  Patient underwent TIPS and embolization of gastric varices by IR on 5/18 -Patient with fever of 100.9 on 5/31, procalcitonin is elevated, started on Rocephin, abdomen is nontender, chest x-ray with no pneumonia, UA with no evidence of UTI as well. -Patient with fever, alk phos trending up, ultrasound abdomen showing with no evidence of ascites for paracentesis, patient is improving with antibiotics, he is afebrile, procalcitonin is trending down, CT abdomen pelvis was obtained, no acute findings in the liver to explain elevated alk phos, I have discussed with GI , patient can be discharged on 1 week of antibiotics, commendation to follow-up in GI office in 1 to 2 weeks regarding repeat LFTs to ensure her alk phos is trending down.  Chronic diastolic congestive heart failure TTE with LVEF 41-66%, grade 3 diastolic dysfunction.  On torsemide and spironolactone outpatient - -there is no evidence of ascites, Aldactone has been stopped, and torsemide has been changed to Lasix.    Lactic acidosis, likely multifactorial Etiology unclear, but suspect from dehydration/hepatic encephalopathy.  No signs of focal infectious process, afebrile without leukocytosis, urinalysis unremarkable, chest x-ray without focal consolidation. Started empirically on antibiotics by admitting physician with vancomycin and cefepime; discontinued as his etiology of lactic acidosis related to his encephalopathy from rather than infectious cause.  CKD stage  IIIa - Baseline creatinine 1.2.   -Creatinine within normal limits today  unclear if baseline is truly stage 3A -given GFR is greater than 60 at this point  Type 2 diabetes mellitus Resume home medications on discharge  Hypokalemia -repleted  HLD:  Statin has been held on discharge  Morbid obesity Body mass index is 33.99 kg/m.  Discussed with patient needs for aggressive lifestyle changes/weight loss as this complicates all facets of care. Outpatient follow-up with PCP.   Weakness/deconditioning: - PT/OT as outpatient  Discharge Diagnoses:  Principal Problem:   Hepatic encephalopathy ([name]) Active Problems:   Type 2 diabetes with complication (HCC)   GERD without esophagitis   Hyperlipidemia with target LDL less than 70   CKD (chronic kidney disease), stage III (HCC)   Alcoholic cirrhosis of liver with ascites ([name])   Hepatocellular carcinoma [name])    Discharge Instructions  Discharge Instructions    Diet - low sodium heart healthy   Complete by: As directed    Discharge instructions   Complete by: As directed    Follow with Primary MD John Koch, MD in 7 days   Get CBC, CMP,  checked  by Primary MD next visit.    Activity: As tolerated with Full fall precautions use walker/cane & assistance as needed   Disposition Home    Diet: Heart Healthy /carb modifed , with feeding assistance and aspiration precautions.  For Heart failure patients - Check your Weight same time everyday, if you gain over 2 pounds, or you develop in leg swelling, experience more shortness of breath or chest pain, call your Primary MD immediately. Follow Cardiac Low Salt Diet and 1.5 lit/day fluid restriction.   On your next visit with your primary care physician please Get Medicines reviewed and adjusted.   Please request your Prim.MD to go over all Hospital Tests and Procedure/Radiological results at the follow up, please get all Hospital records sent to your Prim MD by  signing hospital release before you go home.   If you experience worsening of your admission symptoms, develop shortness of breath, life threatening emergency, suicidal or homicidal thoughts you must seek medical attention immediately by calling 911 or calling your MD immediately  if symptoms less severe.  You Must read complete instructions/literature along with all the possible adverse reactions/side effects for all the Medicines you take and that have been prescribed to you. Take any new Medicines after you have completely understood and accpet all the possible adverse reactions/side effects.   Do not drive, operating heavy machinery, perform activities at heights, swimming or participation in water activities or provide baby sitting services if your were admitted for syncope or siezures until you have seen by Primary MD or a Neurologist and advised to do so again.  Do not drive when taking Pain medications.    Do not take more than prescribed Pain, Sleep and Anxiety Medications  Special Instructions: If you have smoked or chewed Tobacco  in the last 2 yrs please stop smoking, stop any regular Alcohol  and or any Recreational drug use.  Wear Seat belts while driving.   Please note  You were cared for by a hospitalist during your hospital stay. If you have any questions about your discharge medications or the care you received while you were in the hospital after you are discharged, you can call the unit and asked to speak with the hospitalist on call if the hospitalist that took care of you is not available. Once you are discharged, your primary care physician will handle any further medical issues. Please  note that NO REFILLS for any discharge medications will be authorized once you are discharged, as it is imperative that you return to your primary care physician (or establish a relationship with a primary care physician if you do not have one) for your aftercare needs so that they can  reassess your need for medications and monitor your lab values.   Increase activity slowly   Complete by: As directed      Allergies as of 01/30/2021      Reactions   Fluzone Quadrivalent [influenza Vac Split Quad] Other (See Comments)   The patient stated, in 10/2020: "I am not taking any more flu shots. It liked to have killed me."      Medication List    STOP taking these medications   atorvastatin 40 MG tablet Commonly known as: LIPITOR   gabapentin 100 MG capsule Commonly known as: NEURONTIN   METAMUCIL PO   polyethylene glycol powder 17 GM/SCOOP powder Commonly known as: GLYCOLAX/MIRALAX   spironolactone 25 MG tablet Commonly known as: ALDACTONE   sucralfate 1 g tablet Commonly known as: CARAFATE   torsemide 20 MG tablet Commonly known as: DEMADEX     TAKE these medications   Accu-Chek Softclix Lancets lancets USE AS DIRECTED TO TEST BLOOD SUGAR FOUR TIMES DAILY   albuterol 108 (90 Base) MCG/ACT inhaler Commonly known as: VENTOLIN HFA Inhale 2 puffs into the lungs every 6 (six) hours as needed for wheezing or shortness of breath.   amoxicillin-clavulanate 875-125 MG tablet Commonly known as: Augmentin Take 1 tablet by mouth 2 (two) times daily for 7 days.   Anoro Ellipta 62.5-25 MCG/INH Aepb Generic drug: umeclidinium-vilanterol Inhale 1 puff into the lungs daily.   blood glucose meter kit and supplies Dispense based on patient and insurance preference. Use up to four times daily as directed. (FOR ICD-10 E10.9, E11.9).   furosemide 40 MG tablet Commonly known as: LASIX Take 1 tablet (40 mg total) by mouth daily. Start taking on: January 31, 2021   glimepiride 2 MG tablet Commonly known as: AMARYL TAKE 1 TABLET(2 MG) BY MOUTH DAILY BEFORE BREAKFAST What changed: See the new instructions.   hydrocortisone 2.5 % rectal cream Commonly known as: Anusol-HC Place 1 application rectally 2 (two) times daily. What changed:   when to take this  reasons to  take this   lactulose 10 GM/15ML solution Commonly known as: CHRONULAC Take 30 mLs (20 g total) by mouth 3 (three) times daily.   metFORMIN 850 MG tablet Commonly known as: GLUCOPHAGE TAKE 1 TABLET(850 MG) BY MOUTH TWICE DAILY WITH A MEAL What changed: See the new instructions.   nitroGLYCERIN 0.4 MG SL tablet Commonly known as: NITROSTAT Place 1 tablet (0.4 mg total) under the tongue every 5 (five) minutes as needed for chest pain.   pantoprazole 40 MG tablet Commonly known as: PROTONIX Take 1 tablet (40 mg total) by mouth 2 (two) times daily.       Follow-up Information    [place], Leslie Follow up.   Specialty: [place] Why: for home health services, they will call you in 1-2 days to set up a home visit Contact information: [place] [address] [phone]        Ronnette Juniper, MD Follow up in 1 week(s).   Specialty: Gastroenterology Contact information: [place] [address] [phone]              Allergies  Allergen Reactions  . Fluzone Quadrivalent [Influenza Vac Split Quad] Other (See Comments)    The patient stated, in 10/2020: "I am not taking any more flu shots. It liked to have killed me."    Consultations:  [facility]   Procedures/Studies: CT ABDOMEN PELVIS WO CONTRAST  Result Date: 01/29/2021 CLINICAL DATA:  Fever of unknown origin, decompensated cirrhosis, complicated by [facility] and variceal bleeding, status post TIPS and coil embolization of gastric varices EXAM: CT ABDOMEN AND PELVIS WITHOUT CONTRAST TECHNIQUE: Multidetector CT imaging of the abdomen and pelvis was performed following the standard protocol without IV contrast. Oral enteric contrast was administered. COMPARISON:  01/08/2021 FINDINGS: Lower chest: No acute abnormality. Coronary artery calcifications and/or stents. Hepatobiliary: Cirrhotic morphology of the liver. Interval placement of right hepatic  vein TIPS. Subcapsular hypodense lesion of the peripheral liver dome, hepatic segment VII, measuring 3.2 x 2.3 cm (series 3, image 14). No gallstones, gallbladder wall thickening, or biliary dilatation. Pancreas: Unremarkable. No pancreatic ductal dilatation or surrounding inflammatory changes. Spleen: Mild splenomegaly, maximum coronal span 14.2 cm. Punctuate calcifications of the splenic parenchyma, in keeping with prior granulomatous infection Adrenals/Urinary Tract: Adrenal glands are unremarkable. Asymmetric atrophy of the right kidney. Nonobstructive calculus of the anterior midportion of the right kidney. No hydronephrosis. Bladder is unremarkable. Stomach/Bowel: Stomach is within normal limits. Appendix appears normal. There is a very redundant sigmoid colon looped into the right lower quadrant, with diffuse, long segment wall thickening of the descending colon, sigmoid colon, and rectum (series 3, image 73, , series 6, image 45). Vascular/Lymphatic: Aortic atherosclerosis. Interval coiling of multiple gastric varices. No enlarged abdominal or pelvic lymph nodes. Reproductive: No mass or other significant abnormality. Other: Fat containing right-sided lumbar hernia (series 3, image 37). Near complete interval resolution of ascites. Musculoskeletal: No acute or significant osseous findings. IMPRESSION: 1. Cirrhotic morphology of the liver with interval placement of right hepatic vein TIPS and coiling of gastric varices. 2. There is a very redundant sigmoid colon looped into the right lower quadrant, with diffuse, long segment wall thickening of the descending colon, sigmoid colon, and rectum. Findings are consistent with nonspecific infectious, inflammatory, or ischemic colitis. 3. Near complete interval resolution of ascites. 4. Subcapsular hypodense lesion of the peripheral liver dome, hepatic segment VII, presumably a treated hepatocellular carcinoma, incompletely characterized and assessed by noncontrast  CT. 5. Nonobstructive right nephrolithiasis. 6. Coronary artery disease. Aortic Atherosclerosis (ICD10-I70.0). Electronically Signed   By: Eddie Candle M.D.   On: 01/29/2021 20:07   CT Head Wo Contrast  Result Date: 01/22/2021 CLINICAL DATA:  75 year old male with delirium. EXAM: CT HEAD WITHOUT CONTRAST TECHNIQUE: Contiguous axial images were obtained from the base of the skull through the vertex without intravenous contrast. COMPARISON:  Head CT dated 05/25/2018. FINDINGS: Brain: The ventricles and sulci appropriate size for patient's age. The gray-white matter discrimination is preserved. There is no acute intracranial hemorrhage. No mass effect or midline shift. No extra-axial fluid collection. Vascular: No hyperdense vessel or unexpected calcification. Skull: Normal. Negative for fracture or focal lesion. Sinuses/Orbits: There is diffuse mucoperiosteal thickening of paranasal sinuses. No air-fluid level. The mastoid air cells are clear. Other: None IMPRESSION: 1. No acute intracranial pathology. 2. Paranasal sinus disease. Electronically Signed   By: Anner Crete M.D.   On: 01/22/2021 18:33   IR Tips  Result Date: 01/13/2021 CLINICAL DATA:  75 year old male with history of cirrhosis (Child Pugh A, MELD-Na 11) with associated hemorrhagic gastroesophageal varices, history of HCC status post TARE  segmentectomy (November 2021) without evidence of recurrence or new disease. Currently stable without evidence of active hemorrhage. EXAM: 1. Ultrasound-guided access of the right common femoral vein. 2. Intravascular ultrasound. 3. Ultrasound-guided access of the right internal jugular vein. 4. Selective catheterization of the right hepatic vein. 5. Portosystemic manometry. 6. Transjugular intrahepatic portosystemic shunt creation. 7. Selective catheterization of multiple variceal gastric veins. 8. Coil embolization of multiple variceal gastric veins. MEDICATIONS: The patient was receiving ceftriaxone as an  inpatient which was administered approximately 1 hour prior to the procedure. ANESTHESIA/SEDATION: General - as administered by the Anesthesia department CONTRAST:  130 mL Omnipaque 300, intravenous FLUOROSCOPY TIME:  Fluoroscopy Time: 31 minutes 48 seconds (1,216 mGy). COMPLICATIONS: None immediate. PROCEDURE: Informed written consent was obtained from the patient after a thorough discussion of the procedural risks, benefits and alternatives. All questions were addressed. Maximal Sterile Barrier Technique was utilized including caps, mask, sterile gowns, sterile gloves, sterile drape, hand hygiene and skin antiseptic. A timeout was performed prior to the initiation of the procedure. The right neck and right groin were prepped and draped in standard fashion. Preprocedure ultrasound evaluation of the right common femoral vein demonstrated a patent vessel that was compressible and free of internal echoes. A permanent ultrasound image was stored. A small skin nick was made at the planned needle entry site. Under direct ultrasound visualization, a 21 gauge micropuncture needle was directed into the common femoral vein. After insertion of a micropuncture set, serial dilation was performed over a Rosen wire and an 11 cm, 8 French vascular sheath was placed. Through this 8 French sheath, under direct fluoroscopic visualization, an intracardiac echocardiography intravascular ultrasound probe was advanced to the level of the perihepatic IVC. Intravascular ultrasound demonstrated a patent portal system in close proximity to the right hepatic vein, appropriate anatomy for TIPS creation. Ultrasound evaluation of the right internal jugular vein demonstrated patent vessel that was compressible and free of internal echoes. A permanent ultrasound image was stored. A small skin nick was made at the planned needle entry site. Under direct ultrasound visualization, a 21 gauge micropuncture needle was directed into the right internal  jugular vein. A micropuncture set was placed through which a Rosen wire was directed to the inferior vena cava under fluoroscopic guidance. Serial dilation was performed and a 10 French tips sheath was then placed with the tip in the right atrium. Right atrial pressure was then measured (mean 24 mm Hg). Using fluoroscopic guidance, a 5 French angled tip catheter was then directed into the right hepatic vein. Steep right anterior oblique fluoroscopic image confirmed placement in the right hepatic vein. Limited hand injection right hepatic venogram demonstrated wide patency and antegrade flow into the right atrium. The sheath was advanced over the catheter into the right hepatic vein. Using direct intravascular ultrasound guidance, the [place] needle was directed from the right hepatic vein into the right portal vein with a single throw. Intravascular position was confirmed with aspiration of portal venous blood. Hand injection of contrast demonstrated appropriate puncture site. A glidewire was then directed into the splenic vein. A marking pigtail catheter was inserted and positioned at the portal confluence. Portal venogram demonstrated patent antegrade flow through the patent main, left, and right portal systems. There was a prominent left gastric and several accessory gastric veins with severe gastric varices noted. Portal manometry was performed (mean portal pressure 28 mm mercury, pre TIPS portosystemic gradient equals 4 mm Hg). The intrahepatic tract was then dilated with an 8 x 4 mustang  balloon. The 10 French sheath was then advanced to the level of the main portal vein. An 8-10 mm, 8+2 cm Viatorr endograft was then positioned and the portal end was uncovered. The gold ring was positioned at the portal vein entry site. The stent was then fully deployed under fluoroscopic guidance. The pigtail catheter was then reinserted with the pigtail portion formed in the main portal vein. Portal venogram demonstrated  patent TIPS stent and decreased but persistent retrograde flow into the gastric varices. Portosystemic manometry was again measured (right atrium mean 24 mmHg, portal vein 29 mmHg, gradient 5 mmHg). Given the elevated right heart pressures, post-deployment dilation was not pursued. The pigtail catheter was exchanged for a 5 Fr angle tip catheter which was used to selectively catheterize the most prominent gastric vein arising from the splenic vein, likely the left gastric. Injection of contrast demonstrated hepatofugal flow with multiple tortuous varicosities near the fundus of the stomach. Three separate gastric veins were selected and embolized with various sizes of POD microcoils via a Lantern microcatheter. The pigtail catheter was reinserted into the portal vein and completion portal venogram was performed which demonstrated widely patent TIPS stent and minimal retrograde filling of embolized gastric varices. The indwelling pigtail catheter was then removed over a wire and the ICE catheter was removed. The right IJ and right common femoral vein sheath were then removed and manual compression was applied for 5 minutes with adequate hemostasis. Sterile bandages were applied. The patient was extubated and transferred to the postanesthesia care unit in stable condition. IMPRESSION: 1. Technically successful transjugular intrahepatic portosystemic shunt creation. 2. 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 3. Elevated right heart pressures (mean of 24 mmHg) pre- and post-TIPS creation with minimal portosystemic pressure gradient. Ruthann Cancer, MD [facility] Electronically Signed   By: Ruthann Cancer MD   On: 01/13/2021 16:54   IR US Guide Vasc Access Right  Result Date: 01/13/2021 CLINICAL DATA:  75 year old male with history of cirrhosis (Child Pugh A, MELD-Na 11) with associated  hemorrhagic gastroesophageal varices, history of HCC status post TARE segmentectomy (November 2021) without evidence of recurrence or new disease. Currently stable without evidence of active hemorrhage. EXAM: 1. Ultrasound-guided access of the right common femoral vein. 2. Intravascular ultrasound. 3. Ultrasound-guided access of the right internal jugular vein. 4. Selective catheterization of the right hepatic vein. 5. Portosystemic manometry. 6. Transjugular intrahepatic portosystemic shunt creation. 7. Selective catheterization of multiple variceal gastric veins. 8. Coil embolization of multiple variceal gastric veins. MEDICATIONS: The patient was receiving ceftriaxone as an inpatient which was administered approximately 1 hour prior to the procedure. ANESTHESIA/SEDATION: General - as administered by the Anesthesia department CONTRAST:  130 mL Omnipaque 300, intravenous FLUOROSCOPY TIME:  Fluoroscopy Time: 31 minutes 48 seconds (1,216 mGy). COMPLICATIONS: None immediate. PROCEDURE: Informed written consent was obtained from the patient after a thorough discussion of the procedural risks, benefits and alternatives. All questions were addressed. Maximal Sterile Barrier Technique was utilized including caps, mask, sterile gowns, sterile gloves, sterile drape, hand hygiene and skin antiseptic. A timeout was performed prior to the initiation of the procedure. The right neck and right groin were prepped and draped in standard fashion. Preprocedure ultrasound evaluation of the right common femoral vein demonstrated a patent vessel that was compressible and free of internal echoes. A permanent ultrasound image was stored. A small skin nick was made at the planned needle entry site.  Under direct ultrasound visualization, a 21 gauge micropuncture needle was directed into the common femoral vein. After insertion of a micropuncture set, serial dilation was performed over a Rosen wire and an 11 cm, 8 French vascular sheath was  placed. Through this 8 French sheath, under direct fluoroscopic visualization, an intracardiac echocardiography intravascular ultrasound probe was advanced to the level of the perihepatic IVC. Intravascular ultrasound demonstrated a patent portal system in close proximity to the right hepatic vein, appropriate anatomy for TIPS creation. Ultrasound evaluation of the right internal jugular vein demonstrated patent vessel that was compressible and free of internal echoes. A permanent ultrasound image was stored. A small skin nick was made at the planned needle entry site. Under direct ultrasound visualization, a 21 gauge micropuncture needle was directed into the right internal jugular vein. A micropuncture set was placed through which a Rosen wire was directed to the inferior vena cava under fluoroscopic guidance. Serial dilation was performed and a 10 French tips sheath was then placed with the tip in the right atrium. Right atrial pressure was then measured (mean 24 mm Hg). Using fluoroscopic guidance, a 5 French angled tip catheter was then directed into the right hepatic vein. Steep right anterior oblique fluoroscopic image confirmed placement in the right hepatic vein. Limited hand injection right hepatic venogram demonstrated wide patency and antegrade flow into the right atrium. The sheath was advanced over the catheter into the right hepatic vein. Using direct intravascular ultrasound guidance, the Colopinto needle was directed from the right hepatic vein into the right portal vein with a single throw. Intravascular position was confirmed with aspiration of portal venous blood. Hand injection of contrast demonstrated appropriate puncture site. A glidewire was then directed into the splenic vein. A marking pigtail catheter was inserted and positioned at the portal confluence. Portal venogram demonstrated patent antegrade flow through the patent main, left, and right portal systems. There was a prominent left  gastric and several accessory gastric veins with severe gastric varices noted. Portal manometry was performed (mean portal pressure 28 mm mercury, pre TIPS portosystemic gradient equals 4 mm Hg). The intrahepatic tract was then dilated with an 8 x 4 mustang balloon. The 10 French sheath was then advanced to the level of the main portal vein. An 8-10 mm, 8+2 cm Viatorr endograft was then positioned and the portal end was uncovered. The gold ring was positioned at the portal vein entry site. The stent was then fully deployed under fluoroscopic guidance. The pigtail catheter was then reinserted with the pigtail portion formed in the main portal vein. Portal venogram demonstrated patent TIPS stent and decreased but persistent retrograde flow into the gastric varices. Portosystemic manometry was again measured (right atrium mean 24 mmHg, portal vein 29 mmHg, gradient 5 mmHg). Given the elevated right heart pressures, post-deployment dilation was not pursued. The pigtail catheter was exchanged for a 5 Fr angle tip catheter which was used to selectively catheterize the most prominent gastric vein arising from the splenic vein, likely the left gastric. Injection of contrast demonstrated hepatofugal flow with multiple tortuous varicosities near the fundus of the stomach. Three separate gastric veins were selected and embolized with various sizes of POD microcoils via a Lantern microcatheter. The pigtail catheter was reinserted into the portal vein and completion portal venogram was performed which demonstrated widely patent TIPS stent and minimal retrograde filling of embolized gastric varices. The indwelling pigtail catheter was then removed over a wire and the ICE catheter was removed. The right IJ and right  common femoral vein sheath were then removed and manual compression was applied for 5 minutes with adequate hemostasis. Sterile bandages were applied. The patient was extubated and transferred to the postanesthesia  care unit in stable condition. IMPRESSION: 1. Technically successful transjugular intrahepatic portosystemic shunt creation. 2. 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 3. Elevated right heart pressures (mean of 24 mmHg) pre- and post-TIPS creation with minimal portosystemic pressure gradient. Ruthann Cancer, MD [facility] Radiology Electronically Signed   By: Ruthann Cancer MD   On: 01/13/2021 16:54   DG Chest Port 1 View  Result Date: 01/27/2021 CLINICAL DATA:  Fever. EXAM: PORTABLE CHEST 1 VIEW COMPARISON:  One-view chest x-ray 01/22/2021 FINDINGS: Heart is enlarged. Increasing interstitial and airspace disease is present bilaterally. Postoperative changes noted. IMPRESSION: Cardiomegaly with increasing interstitial and airspace disease compatible with edema versus infection. Electronically Signed   By: San Morelle M.D.   On: 01/27/2021 16:42   DG Chest Port 1 View  Result Date: 01/22/2021 CLINICAL DATA:  75 year old male with concern for sepsis. EXAM: PORTABLE CHEST 1 VIEW COMPARISON:  Chest radiograph dated 11/20/2020. FINDINGS: There is cardiomegaly with vascular congestion. No focal consolidation, pleural effusion, or pneumothorax. Median sternotomy wires and CABG vascular clips. No acute osseous pathology. IMPRESSION: Cardiomegaly with vascular congestion.  No focal consolidation. Electronically Signed   By: Anner Crete M.D.   On: 01/22/2021 16:29   DG Chest Port 1 View  Result Date: 01/06/2021 CLINICAL DATA:  Dyspnea.  Shortness of breath. EXAM: PORTABLE CHEST 1 VIEW COMPARISON:  11/20/2020 FINDINGS: Post median sternotomy and CABG. The heart is enlarged. Stable mediastinal contours. Interstitial edema which appears similar to prior exam. Probable right pleural effusion. No confluent consolidation or pneumothorax. Stable osseous structures. IMPRESSION: Cardiomegaly  with interstitial edema, similar to prior exam. Probable right pleural effusion. Electronically Signed   By: Keith Rake M.D.   On: 01/06/2021 16:50   ECHOCARDIOGRAM COMPLETE  Result Date: 01/12/2021    ECHOCARDIOGRAM REPORT   Patient Name:   EARMON SHERROW [facility], [name] Date of Exam: 01/12/2021 Medical Rec #:  [id number]     Height:       68.0 in Accession #:    [id number]    Weight:       238.8 lb Date of Birth:  11-Mar-1946     BSA:          2.204 m Patient Age:    22 years      BP:           99/43 mmHg Patient Gender: M             HR:           53 bpm. Exam Location:  Inpatient Procedure: 2D Echo, Cardiac Doppler and Color Doppler Indications:    CHF and right heart function prior to TIPS procedure  History:        Patient has prior history of Echocardiogram examinations, most                 recent 05/27/2020. CHF, CAD, Defibrillator; Risk                 Factors:Hypertension, Diabetes and Dyslipidemia.  Sonographer:    Luisa Hart RDCS Referring Phys: [place]  1. Left ventricular ejection fraction, by estimation, is 60 to 65%. The left ventricle has normal function. The left ventricle has no regional wall motion abnormalities.  There is moderate concentric left ventricular hypertrophy. Left ventricular diastolic parameters are consistent with Grade III diastolic dysfunction (restrictive).  2. Right ventricular systolic function is low normal. The right ventricular size is normal. There is mildly elevated pulmonary artery systolic pressure.  3. The mitral valve is normal in structure. No evidence of mitral valve regurgitation. The mean mitral valve gradient is 4.0 mmHg with average heart rate of 56 bpm.  4. The aortic valve is tricuspid. There is moderate calcification of the aortic valve. Aortic valve regurgitation is mild.  5. The inferior vena cava is dilated in size with <50% respiratory variability, suggesting right atrial pressure of 15 mmHg. Comparison(s): A prior study was performed on  05/27/20. No significant change from prior study. Prior images reviewed side by side. RV is similar. FINDINGS  Left Ventricle: Left ventricular ejection fraction, by estimation, is 60 to 65%. The left ventricle has normal function. The left ventricle has no regional wall motion abnormalities. The left ventricular internal cavity size was normal in size. There is  moderate concentric left ventricular hypertrophy. Left ventricular diastolic parameters are consistent with Grade III diastolic dysfunction (restrictive). Right Ventricle: The right ventricular size is normal. No increase in right ventricular wall thickness. Right ventricular systolic function is low normal. There is mildly elevated pulmonary artery systolic pressure. The tricuspid regurgitant velocity is 2.67 m/s, and with an assumed right atrial pressure of 8 mmHg, the estimated right ventricular systolic pressure is 97.6 mmHg. Left Atrium: Left atrial size was normal in size. Right Atrium: Right atrial size was normal in size. Pericardium: There is no evidence of pericardial effusion. Mitral Valve: The mitral valve is normal in structure. No evidence of mitral valve regurgitation. MV peak gradient, 11.7 mmHg. The mean mitral valve gradient is 4.0 mmHg with average heart rate of 56 bpm. Tricuspid Valve: The tricuspid valve is grossly normal. Tricuspid valve regurgitation is not demonstrated. No evidence of tricuspid stenosis. Aortic Valve: The aortic valve is tricuspid. There is moderate calcification of the aortic valve. Aortic valve regurgitation is mild. Aortic regurgitation PHT measures 368 msec. Aortic valve mean gradient measures 8.0 mmHg. Aortic valve peak gradient measures 16.6 mmHg. Aortic valve area, by VTI measures 1.42 cm. Pulmonic Valve: The pulmonic valve was grossly normal. Pulmonic valve regurgitation is trivial. No evidence of pulmonic stenosis. Aorta: The aortic root and ascending aorta are structurally normal, with no evidence of  dilitation. Venous: The inferior vena cava is dilated in size with less than 50% respiratory variability, suggesting right atrial pressure of 15 mmHg. IAS/Shunts: The atrial septum is grossly normal.  LEFT VENTRICLE PLAX 2D LVIDd:         4.80 cm      Diastology LVIDs:         2.90 cm      LV e' medial:    7.18 cm/s LV PW:         1.40 cm      LV E/e' medial:  21.0 LV IVS:        1.60 cm      LV e' lateral:   8.70 cm/s LVOT diam:     2.00 cm      LV E/e' lateral: 17.4 LV SV:         73 LV SV Index:   33 LVOT Area:     3.14 cm  LV Volumes (MOD) LV vol d, MOD A2C: 171.0 ml LV vol d, MOD A4C: 127.0 ml LV vol s, MOD A2C: 46.2  ml LV vol s, MOD A4C: 32.6 ml LV SV MOD A2C:     124.8 ml LV SV MOD A4C:     127.0 ml LV SV MOD BP:      106.9 ml RIGHT VENTRICLE RV S prime:     10.20 cm/s  PULMONARY VEINS TAPSE (M-mode): 1.3 cm      A Reversal Duration: 102.00 msec                             A Reversal Velocity: 26.60 cm/s                             Diastolic Velocity:  98.92 cm/s                             S/D Velocity:        0.50                             Systolic Velocity:   11.94 cm/s LEFT ATRIUM             Index       RIGHT ATRIUM           Index LA diam:        4.60 cm 2.09 cm/m  RA Area:     15.60 cm LA Vol (A2C):   84.1 ml 38.16 ml/m RA Volume:   35.00 ml  15.88 ml/m LA Vol (A4C):   49.4 ml 22.42 ml/m LA Biplane Vol: 64.4 ml 29.22 ml/m  AORTIC VALVE                    PULMONIC VALVE AV Area (Vmax):    1.59 cm     PV Vmax:       1.09 m/s AV Area (Vmean):   1.60 cm     PV Vmean:      72.700 cm/s AV Area (VTI):     1.42 cm     PV VTI:        0.267 m AV Vmax:           204.00 cm/s  PV Peak grad:  4.8 mmHg AV Vmean:          129.500 cm/s PV Mean grad:  2.0 mmHg AV VTI:            0.510 m AV Peak Grad:      16.6 mmHg AV Mean Grad:      8.0 mmHg LVOT Vmax:         103.00 cm/s LVOT Vmean:        66.100 cm/s LVOT VTI:          0.231 m LVOT/AV VTI ratio: 0.45 AI PHT:            368 msec  AORTA Ao Root diam: 3.60  cm Ao Asc diam:  3.15 cm MITRAL VALVE                TRICUSPID VALVE MV Area (PHT): 2.91 cm     TR Peak grad:   28.5 mmHg MV Area VTI:   1.41 cm     TR Vmax:        267.00 cm/s MV Peak grad:  11.7 mmHg MV Mean grad:  4.0 mmHg  SHUNTS MV Vmax:       1.71 m/s     Systemic VTI:  0.23 m MV Vmean:      93.8 cm/s    Systemic Diam: 2.00 cm MV Decel Time: 261 msec MV E velocity: 151.00 cm/s MV A velocity: 39.80 cm/s MV E/A ratio:  3.79 Rudean Haskell MD Electronically signed by Rudean Haskell MD Signature Date/Time: 01/12/2021/11:34:54 AM    Final    IR ABDOMEN US LIMITED  Result Date: 01/28/2021 CLINICAL DATA:  Assessment for ascites. History of cirrhosis and recent TIPS procedure. Encephalopathy. EXAM: LIMITED ABDOMEN ULTRASOUND FOR ASCITES TECHNIQUE: Limited ultrasound survey for ascites was performed in all four abdominal quadrants. COMPARISON:  None. FINDINGS: Four quadrant survey of the peritoneal cavity by ultrasound demonstrates no visualized ascites. IMPRESSION: No ascites present. Electronically Signed   By: Aletta Edouard M.D.   On: 01/28/2021 16:15   IR IVUS EACH ADDITIONAL NON CORONARY VESSEL  Result Date: 01/13/2021 CLINICAL DATA:  75 year old male with history of cirrhosis (Child Pugh A, MELD-Na 11) with associated hemorrhagic gastroesophageal varices, history of HCC status post TARE segmentectomy (November 2021) without evidence of recurrence or new disease. Currently stable without evidence of active hemorrhage. EXAM: 1. Ultrasound-guided access of the right common femoral vein. 2. Intravascular ultrasound. 3. Ultrasound-guided access of the right internal jugular vein. 4. Selective catheterization of the right hepatic vein. 5. Portosystemic manometry. 6. Transjugular intrahepatic portosystemic shunt creation. 7. Selective catheterization of multiple variceal gastric veins. 8. Coil embolization of multiple variceal gastric veins. MEDICATIONS: The patient was receiving ceftriaxone as  an inpatient which was administered approximately 1 hour prior to the procedure. ANESTHESIA/SEDATION: General - as administered by the Anesthesia department CONTRAST:  130 mL Omnipaque 300, intravenous FLUOROSCOPY TIME:  Fluoroscopy Time: 31 minutes 48 seconds (1,216 mGy). COMPLICATIONS: None immediate. PROCEDURE: Informed written consent was obtained from the patient after a thorough discussion of the procedural risks, benefits and alternatives. All questions were addressed. Maximal Sterile Barrier Technique was utilized including caps, mask, sterile gowns, sterile gloves, sterile drape, hand hygiene and skin antiseptic. A timeout was performed prior to the initiation of the procedure. The right neck and right groin were prepped and draped in standard fashion. Preprocedure ultrasound evaluation of the right common femoral vein demonstrated a patent vessel that was compressible and free of internal echoes. A permanent ultrasound image was stored. A small skin nick was made at the planned needle entry site. Under direct ultrasound visualization, a 21 gauge micropuncture needle was directed into the common femoral vein. After insertion of a micropuncture set, serial dilation was performed over a Rosen wire and an 11 cm, 8 French vascular sheath was placed. Through this 8 French sheath, under direct fluoroscopic visualization, an intracardiac echocardiography intravascular ultrasound probe was advanced to the level of the perihepatic IVC. Intravascular ultrasound demonstrated a patent portal system in close proximity to the right hepatic vein, appropriate anatomy for TIPS creation. Ultrasound evaluation of the right internal jugular vein demonstrated patent vessel that was compressible and free of internal echoes. A permanent ultrasound image was stored. A small skin nick was made at the planned needle entry site. Under direct ultrasound visualization, a 21 gauge micropuncture needle was directed into the right  internal jugular vein. A micropuncture set was placed through which a Rosen wire was directed to the inferior vena cava under fluoroscopic guidance. Serial dilation was performed and a 10 French tips sheath was then placed with the tip in the right atrium.  Right atrial pressure was then measured (mean 24 mm Hg). Using fluoroscopic guidance, a 5 French angled tip catheter was then directed into the right hepatic vein. Steep right anterior oblique fluoroscopic image confirmed placement in the right hepatic vein. Limited hand injection right hepatic venogram demonstrated wide patency and antegrade flow into the right atrium. The sheath was advanced over the catheter into the right hepatic vein. Using direct intravascular ultrasound guidance, the Colopinto needle was directed from the right hepatic vein into the right portal vein with a single throw. Intravascular position was confirmed with aspiration of portal venous blood. Hand injection of contrast demonstrated appropriate puncture site. A glidewire was then directed into the splenic vein. A marking pigtail catheter was inserted and positioned at the portal confluence. Portal venogram demonstrated patent antegrade flow through the patent main, left, and right portal systems. There was a prominent left gastric and several accessory gastric veins with severe gastric varices noted. Portal manometry was performed (mean portal pressure 28 mm mercury, pre TIPS portosystemic gradient equals 4 mm Hg). The intrahepatic tract was then dilated with an 8 x 4 mustang balloon. The 10 French sheath was then advanced to the level of the main portal vein. An 8-10 mm, 8+2 cm Viatorr endograft was then positioned and the portal end was uncovered. The gold ring was positioned at the portal vein entry site. The stent was then fully deployed under fluoroscopic guidance. The pigtail catheter was then reinserted with the pigtail portion formed in the main portal vein. Portal venogram  demonstrated patent TIPS stent and decreased but persistent retrograde flow into the gastric varices. Portosystemic manometry was again measured (right atrium mean 24 mmHg, portal vein 29 mmHg, gradient 5 mmHg). Given the elevated right heart pressures, post-deployment dilation was not pursued. The pigtail catheter was exchanged for a 5 Fr angle tip catheter which was used to selectively catheterize the most prominent gastric vein arising from the splenic vein, likely the left gastric. Injection of contrast demonstrated hepatofugal flow with multiple tortuous varicosities near the fundus of the stomach. Three separate gastric veins were selected and embolized with various sizes of POD microcoils via a Lantern microcatheter. The pigtail catheter was reinserted into the portal vein and completion portal venogram was performed which demonstrated widely patent TIPS stent and minimal retrograde filling of embolized gastric varices. The indwelling pigtail catheter was then removed over a wire and the ICE catheter was removed. The right IJ and right common femoral vein sheath were then removed and manual compression was applied for 5 minutes with adequate hemostasis. Sterile bandages were applied. The patient was extubated and transferred to the postanesthesia care unit in stable condition. IMPRESSION: 1. Technically successful transjugular intrahepatic portosystemic shunt creation. 2. 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 3. Elevated right heart pressures (mean of 24 mmHg) pre- and post-TIPS creation with minimal portosystemic pressure gradient. Ruthann Cancer, MD [facility] Radiology Electronically Signed   By: Ruthann Cancer MD   On: 01/13/2021 16:54   IR EMBO VENOUS NOT HEMORR HEMANG  INC GUIDE ROADMAPPING  Result Date: 01/13/2021 CLINICAL DATA:  75 year old male with history of cirrhosis (Child  Pugh A, MELD-Na 11) with associated hemorrhagic gastroesophageal varices, history of HCC status post TARE segmentectomy (November 2021) without evidence of recurrence or new disease. Currently stable without evidence of active hemorrhage. EXAM: 1. Ultrasound-guided access of the right common femoral vein. 2. Intravascular ultrasound. 3. Ultrasound-guided  access of the right internal jugular vein. 4. Selective catheterization of the right hepatic vein. 5. Portosystemic manometry. 6. Transjugular intrahepatic portosystemic shunt creation. 7. Selective catheterization of multiple variceal gastric veins. 8. Coil embolization of multiple variceal gastric veins. MEDICATIONS: The patient was receiving ceftriaxone as an inpatient which was administered approximately 1 hour prior to the procedure. ANESTHESIA/SEDATION: General - as administered by the Anesthesia department CONTRAST:  130 mL Omnipaque 300, intravenous FLUOROSCOPY TIME:  Fluoroscopy Time: 31 minutes 48 seconds (1,216 mGy). COMPLICATIONS: None immediate. PROCEDURE: Informed written consent was obtained from the patient after a thorough discussion of the procedural risks, benefits and alternatives. All questions were addressed. Maximal Sterile Barrier Technique was utilized including caps, mask, sterile gowns, sterile gloves, sterile drape, hand hygiene and skin antiseptic. A timeout was performed prior to the initiation of the procedure. The right neck and right groin were prepped and draped in standard fashion. Preprocedure ultrasound evaluation of the right common femoral vein demonstrated a patent vessel that was compressible and free of internal echoes. A permanent ultrasound image was stored. A small skin nick was made at the planned needle entry site. Under direct ultrasound visualization, a 21 gauge micropuncture needle was directed into the common femoral vein. After insertion of a micropuncture set, serial dilation was performed over a Rosen wire and an  11 cm, 8 French vascular sheath was placed. Through this 8 French sheath, under direct fluoroscopic visualization, an intracardiac echocardiography intravascular ultrasound probe was advanced to the level of the perihepatic IVC. Intravascular ultrasound demonstrated a patent portal system in close proximity to the right hepatic vein, appropriate anatomy for TIPS creation. Ultrasound evaluation of the right internal jugular vein demonstrated patent vessel that was compressible and free of internal echoes. A permanent ultrasound image was stored. A small skin nick was made at the planned needle entry site. Under direct ultrasound visualization, a 21 gauge micropuncture needle was directed into the right internal jugular vein. A micropuncture set was placed through which a Rosen wire was directed to the inferior vena cava under fluoroscopic guidance. Serial dilation was performed and a 10 French tips sheath was then placed with the tip in the right atrium. Right atrial pressure was then measured (mean 24 mm Hg). Using fluoroscopic guidance, a 5 French angled tip catheter was then directed into the right hepatic vein. Steep right anterior oblique fluoroscopic image confirmed placement in the right hepatic vein. Limited hand injection right hepatic venogram demonstrated wide patency and antegrade flow into the right atrium. The sheath was advanced over the catheter into the right hepatic vein. Using direct intravascular ultrasound guidance, the Colopinto needle was directed from the right hepatic vein into the right portal vein with a single throw. Intravascular position was confirmed with aspiration of portal venous blood. Hand injection of contrast demonstrated appropriate puncture site. A glidewire was then directed into the splenic vein. A marking pigtail catheter was inserted and positioned at the portal confluence. Portal venogram demonstrated patent antegrade flow through the patent main, left, and right portal  systems. There was a prominent left gastric and several accessory gastric veins with severe gastric varices noted. Portal manometry was performed (mean portal pressure 28 mm mercury, pre TIPS portosystemic gradient equals 4 mm Hg). The intrahepatic tract was then dilated with an 8 x 4 mustang balloon. The 10 French sheath was then advanced to the level of the main portal vein. An 8-10 mm, 8+2 cm Viatorr endograft was then positioned and the portal end was uncovered.  The gold ring was positioned at the portal vein entry site. The stent was then fully deployed under fluoroscopic guidance. The pigtail catheter was then reinserted with the pigtail portion formed in the main portal vein. Portal venogram demonstrated patent TIPS stent and decreased but persistent retrograde flow into the gastric varices. Portosystemic manometry was again measured (right atrium mean 24 mmHg, portal vein 29 mmHg, gradient 5 mmHg). Given the elevated right heart pressures, post-deployment dilation was not pursued. The pigtail catheter was exchanged for a 5 Fr angle tip catheter which was used to selectively catheterize the most prominent gastric vein arising from the splenic vein, likely the left gastric. Injection of contrast demonstrated hepatofugal flow with multiple tortuous varicosities near the fundus of the stomach. Three separate gastric veins were selected and embolized with various sizes of POD microcoils via a Lantern microcatheter. The pigtail catheter was reinserted into the portal vein and completion portal venogram was performed which demonstrated widely patent TIPS stent and minimal retrograde filling of embolized gastric varices. The indwelling pigtail catheter was then removed over a wire and the ICE catheter was removed. The right IJ and right common femoral vein sheath were then removed and manual compression was applied for 5 minutes with adequate hemostasis. Sterile bandages were applied. The patient was extubated and  transferred to the postanesthesia care unit in stable condition. IMPRESSION: 1. Technically successful transjugular intrahepatic portosystemic shunt creation. 2. 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 3. Elevated right heart pressures (mean of 24 mmHg) pre- and post-TIPS creation with minimal portosystemic pressure gradient. Ruthann Cancer, MD [facility] Electronically Signed   By: Ruthann Cancer MD   On: 01/13/2021 16:54   CT Angio Abd/Pel w/ and/or w/o  Result Date: 01/08/2021 CLINICAL DATA:  75 year old male with history of cirrhosis, hepatocellular carcinoma status post radiation segmentectomy segment 8 in November 2021, and gastroesophageal varices presenting with anemia and possible gastrointestinal hemorrhage. EXAM: CTA ABDOMEN AND PELVIS WITH CONTRAST TECHNIQUE: Multidetector CT imaging of the abdomen and pelvis was performed using the standard protocol before, during, and after bolus administration of intravenous contrast. Multiplanar reconstructed images and MIPs were obtained and reviewed to evaluate the vascular anatomy. CONTRAST:  165m OMNIPAQUE IOHEXOL 350 MG/ML SOLN COMPARISON:  04/20/2020 FINDINGS: VASCULAR Aorta: Normal caliber aorta without aneurysm, dissection, vasculitis or significant stenosis. Circumferential atherosclerotic calcifications. Celiac: Patent without evidence of aneurysm, dissection, vasculitis or significant stenosis. SMA: Patent without evidence of aneurysm, dissection, vasculitis or significant stenosis. Renals: Severe stenosis of the single right renal ostia secondary prominent atherosclerotic plaque. The dual left renal arteries are patent. IMA: Patent without evidence of aneurysm, dissection, vasculitis or significant stenosis. Inflow: Patent without evidence of aneurysm, dissection, vasculitis or significant stenosis. Proximal Outflow:  Bilateral common femoral and visualized portions of the superficial and profunda femoral arteries are patent without evidence of aneurysm, dissection, vasculitis or significant stenosis. Veins: The hepatic veins appear patent. The portal system appears widely patent. Gastroesophageal varices arise from the splenic vein. No evidence of portosystemic shunt. The renal veins are widely patent. No evidence of iliocaval anomaly or thrombus. Review of the MIP images confirms the above findings. NON-VASCULAR Lower chest: Small right and trace left pleural effusions with associated passive bibasilar atelectasis. Mild diffuse ground-glass pulmonary opacities and interlobular septal thickening. Mild global cardiomegaly. No pericardial effusion. Severe coronary atherosclerotic calcifications. Partially visualized calcified mediastinal and hilar lymph nodes. Hepatobiliary: Shrunken and nodular contour of the  liver compatible with history of cirrhosis. Ill-defined hypodensity in segment 8 measuring approximately 3.5 cm without appreciable enhancement on arterial or delayed venous phases. No new hepatomas. The gallbladder is present unremarkable. No intra or extrahepatic biliary ductal dilation. Pancreas: Unremarkable. No pancreatic ductal dilatation or surrounding inflammatory changes. Spleen: Upper limits of normal size measuring up to 13 mm in craniocaudal dimension. Scattered punctate calcifications. No mass. Adrenals/Urinary Tract: Adrenal glands are unremarkable. Unchanged diffuse atrophy of the right kidney with similar appearing upper pole nonobstructive renal calculus measuring up to 14 mm. The left kidney is normal. No hydronephrosis. The bladder is distended without wall thickening. Bladder is unremarkable. Stomach/Bowel: Multiple prominent and patent distal esophageal varices are visualized. Multiple prominent varices about the gastric cardia. Stomach is otherwise within normal limits. Appendix is surgically absent. No  evidence of bowel wall thickening, distention, or inflammatory changes. No evidence of intraluminal hemorrhage. Lymphatic: Prominent bilateral inguinal lymph nodes with preserved fatty hila, likely reactive. No abdominopelvic lymphadenopathy. Reproductive: Prostate is unremarkable. Other: Trace ascites.  Diffuse body wall anasarca. Musculoskeletal: No acute or significant osseous findings. IMPRESSION: VASCULAR 1. No evidence of gastrointestinal hemorrhage, as queried. 2. Prominent esophageal and gastric varices. No evidence of renal portosystemic shunt. 3. Similar appearing severe focal stenosis about the right renal artery ostium with associated chronic right renal atrophy. NON-VASCULAR 1. Similar appearing morphologic changes of cirrhosis with secondary signs of portal hypertension including trace ascites, gastroesophageal varices, small right trace left pleural effusions, mild pulmonary edema, and body wall anasarca. With similar appearing cardiomegaly, there is likely partial cardiogenic etiology to fluid overload status. 2. Postprocedural changes after segment 8 hepatic radiation segmentectomy without evidence of persistent enhancing mass. 3. Evidence of prior granulomatous disease. 4. Nonobstructive right upper pole nephrolithiasis, similar to comparison. Ruthann Cancer, MD [name] Electronically Signed   By: Ruthann Cancer MD   On: 01/08/2021 16:19      Subjective:  Patient denies any abdominal pain, nausea or vomiting, reports 3-4 loose bowel movement today, ports he is feeling much better, and eager to go home today. Discharge Exam: Vitals:   01/30/21 0438 01/30/21 1531  BP: (!) 108/55   Pulse: 75   Resp: 18 17  Temp: 98 F (36.7 C) 97.6 F (36.4 C)  SpO2: 98%    Vitals:   01/29/21 0918 01/29/21 1953 01/30/21 0438 01/30/21 1531  BP:  (!) 108/53 (!) 108/55   Pulse: 70 70 75   Resp: '16 18 18 17  ' Temp:  98.2 F (36.8 C) 98 F  (36.7 C) 97.6 F (36.4 C)  TempSrc:  Oral Oral Oral  SpO2: 98% 98% 98%   Weight:      Height:        General: Pt is alert, awake, not in acute distress Cardiovascular: RRR, S1/S2 +, no rubs, no gallops, +2 edema Respiratory: CTA bilaterally, no wheezing, no rhonchi Abdominal: Soft, NT, ND, bowel sounds + Extremities: no edema, no cyanosis    The results of significant diagnostics from this hospitalization (including imaging, microbiology, ancillary and laboratory) are listed below for reference.     Microbiology: Recent Results (from the past 240 hour(s))  Blood Culture (routine x 2)     Status: Abnormal   Collection Time: 01/22/21  2:08 PM   Specimen: BLOOD  Result Value Ref Range Status   Specimen Description BLOOD RIGHT ANTECUBITAL  Final   Special Requests   Final    BOTTLES DRAWN AEROBIC AND ANAEROBIC Blood  Culture adequate volume   Culture  Setup Time   Final    GRAM POSITIVE COCCI IN CLUSTERS IN BOTH AEROBIC AND ANAEROBIC BOTTLES CRITICAL RESULT CALLED TO, READ BACK BY AND VERIFIED WITH: Gorden Harms PHARMD '@1949'  01/23/21 EB    Culture (A)  Final    STAPHYLOCOCCUS EPIDERMIDIS THE SIGNIFICANCE OF ISOLATING THIS ORGANISM FROM A SINGLE SET OF BLOOD CULTURES WHEN MULTIPLE SETS ARE DRAWN IS UNCERTAIN. PLEASE NOTIFY THE MICROBIOLOGY DEPARTMENT WITHIN ONE WEEK IF SPECIATION AND SENSITIVITIES ARE REQUIRED. Performed at [facility], [place] [address], [place], [place] [address]    Report Status 01/25/2021 FINAL  Final  Blood Culture ID Panel (Reflexed)     Status: Abnormal   Collection Time: 01/22/21  2:08 PM  Result Value Ref Range Status   Enterococcus faecalis NOT DETECTED NOT DETECTED Final   Enterococcus Faecium NOT DETECTED NOT DETECTED Final   Listeria monocytogenes NOT DETECTED NOT DETECTED Final   Staphylococcus species DETECTED (A) NOT DETECTED Final    Comment: CRITICAL RESULT CALLED TO, READ BACK BY AND VERIFIED WITH: Gorden Harms PHARMD '@1949'  01/23/21 EB     Staphylococcus aureus (BCID) NOT DETECTED NOT DETECTED Final   Staphylococcus epidermidis DETECTED (A) NOT DETECTED Final    Comment: Methicillin (oxacillin) resistant coagulase negative staphylococcus. Possible blood culture contaminant (unless isolated from more than one blood culture draw or clinical case suggests pathogenicity). No antibiotic treatment is indicated for blood  culture contaminants. CRITICAL RESULT CALLED TO, READ BACK BY AND VERIFIED WITH: Gorden Harms PHARMD '@1949'  01/23/21 EB    Staphylococcus lugdunensis NOT DETECTED NOT DETECTED Final   Streptococcus species NOT DETECTED NOT DETECTED Final   Streptococcus agalactiae NOT DETECTED NOT DETECTED Final   Streptococcus pneumoniae NOT DETECTED NOT DETECTED Final   Streptococcus pyogenes NOT DETECTED NOT DETECTED Final   A.calcoaceticus-baumannii NOT DETECTED NOT DETECTED Final   Bacteroides fragilis NOT DETECTED NOT DETECTED Final   Enterobacterales NOT DETECTED NOT DETECTED Final   Enterobacter cloacae complex NOT DETECTED NOT DETECTED Final   Escherichia coli NOT DETECTED NOT DETECTED Final   Klebsiella aerogenes NOT DETECTED NOT DETECTED Final   Klebsiella oxytoca NOT DETECTED NOT DETECTED Final   Klebsiella pneumoniae NOT DETECTED NOT DETECTED Final   Proteus species NOT DETECTED NOT DETECTED Final   Salmonella species NOT DETECTED NOT DETECTED Final   Serratia marcescens NOT DETECTED NOT DETECTED Final   Haemophilus influenzae NOT DETECTED NOT DETECTED Final   Neisseria meningitidis NOT DETECTED NOT DETECTED Final   Pseudomonas aeruginosa NOT DETECTED NOT DETECTED Final   Stenotrophomonas maltophilia NOT DETECTED NOT DETECTED Final   Candida albicans NOT DETECTED NOT DETECTED Final   Candida auris NOT DETECTED NOT DETECTED Final   Candida glabrata NOT DETECTED NOT DETECTED Final   Candida krusei NOT DETECTED NOT DETECTED Final   Candida parapsilosis NOT DETECTED NOT DETECTED Final   Candida tropicalis NOT DETECTED NOT  DETECTED Final   Cryptococcus neoformans/gattii NOT DETECTED NOT DETECTED Final   Methicillin resistance mecA/C DETECTED (A) NOT DETECTED Final    Comment: CRITICAL RESULT CALLED TO, READ BACK BY AND VERIFIED WITH: Gorden Harms PHARMD '@1949'  01/23/21 EB Performed at [facility], [address], [place], [place] [address]   Urine culture     Status: None   Collection Time: 01/22/21  3:58 PM   Specimen: In/Out Cath Urine  Result Value Ref Range Status   Specimen Description IN/OUT CATH URINE  Final   Special Requests NONE  Final  Culture   Final    NO GROWTH Performed at [facility], [place] [address] [place], [place] [address]    Report Status 01/26/2021 FINAL  Final  Blood Culture (routine x 2)     Status: None   Collection Time: 01/22/21  4:03 PM   Specimen: BLOOD LEFT WRIST  Result Value Ref Range Status   Specimen Description BLOOD LEFT WRIST  Final   Special Requests   Final    BOTTLES DRAWN AEROBIC AND ANAEROBIC Blood Culture adequate volume   Culture   Final    NO GROWTH 5 DAYS Performed at [facility], [place] [address], [place], [place] [address]    Report Status 01/27/2021 FINAL  Final  Resp Panel by RT-PCR (Flu A&B, Covid) Nasopharyngeal Swab     Status: None   Collection Time: 01/22/21  4:50 PM   Specimen: Nasopharyngeal Swab; Nasopharyngeal(NP) swabs in vial transport medium  Result Value Ref Range Status   SARS Coronavirus 2 by RT PCR NEGATIVE NEGATIVE Final    Comment: (NOTE) SARS-CoV-2 target nucleic acids are NOT DETECTED.  The SARS-CoV-2 RNA is generally detectable in upper respiratory specimens during the acute phase of infection. The lowest concentration of SARS-CoV-2 viral copies this assay can detect is 138 copies/mL. A negative result does not preclude SARS-Cov-2 infection and should not be used as the sole basis for treatment or other patient management decisions. A negative result may occur with  improper specimen collection/handling,  submission of specimen other than nasopharyngeal swab, presence of viral mutation(s) within the areas targeted by this assay, and inadequate number of viral copies(<138 copies/mL). A negative result must be combined with clinical observations, patient history, and epidemiological information. The expected result is Negative.  Fact Sheet for Patients:  EntrepreneurPulse.com.au  Fact Sheet for Healthcare Providers:  IncredibleEmployment.be  This test is no t yet approved or cleared by the [place] FDA and  has been authorized for detection and/or diagnosis of SARS-CoV-2 by FDA under an Emergency Use Authorization (EUA). This EUA will remain  in effect (meaning this test can be used) for the duration of the COVID-19 declaration under Section 564(b)(1) of the Act, 21 U.S.C.section 360bbb-3(b)(1), unless the authorization is terminated  or revoked sooner.       Influenza A by PCR NEGATIVE NEGATIVE Final   Influenza B by PCR NEGATIVE NEGATIVE Final    Comment: (NOTE) The Xpert Xpress SARS-CoV-2/FLU/RSV plus assay is intended as an aid in the diagnosis of influenza from Nasopharyngeal swab specimens and should not be used as a sole basis for treatment. Nasal washings and aspirates are unacceptable for Xpert Xpress SARS-CoV-2/FLU/RSV testing.  Fact Sheet for Patients: EntrepreneurPulse.com.au  Fact Sheet for Healthcare Providers: IncredibleEmployment.be  This test is not yet approved or cleared by the [place] FDA and has been authorized for detection and/or diagnosis of SARS-CoV-2 by FDA under an Emergency Use Authorization (EUA). This EUA will remain in effect (meaning this test can be used) for the duration of the COVID-19 declaration under Section 564(b)(1) of the Act, 21 U.S.C. section 360bbb-3(b)(1), unless the authorization is terminated or revoked.  Performed at [facility], [place] [address] [place], [place] [address]   MRSA PCR Screening     Status: None   Collection Time: 01/24/21  5:04 AM   Specimen: Nasal Mucosa; Nasopharyngeal  Result Value Ref Range Status   MRSA by PCR NEGATIVE NEGATIVE Final    Comment:  The GeneXpert MRSA Assay (FDA approved for NASAL specimens only), is one component of a comprehensive MRSA colonization surveillance program. It is not intended to diagnose MRSA infection nor to guide or monitor treatment for MRSA infections. Performed at [facility], [place] [address] [place], [place] [address]   Culture, blood (routine x 2)     Status: None (Preliminary result)   Collection Time: 01/27/21  5:48 PM   Specimen: BLOOD  Result Value Ref Range Status   Specimen Description BLOOD SITE NOT SPECIFIED  Final   Special Requests   Final    BOTTLES DRAWN AEROBIC AND ANAEROBIC Blood Culture adequate volume   Culture   Final    NO GROWTH 2 DAYS Performed at [facility], [address] [place], [place] [address]    Report Status PENDING  Incomplete  Culture, blood (routine x 2)     Status: None (Preliminary result)   Collection Time: 01/27/21  5:48 PM   Specimen: BLOOD  Result Value Ref Range Status   Specimen Description BLOOD SITE NOT SPECIFIED  Final   Special Requests   Final    BOTTLES DRAWN AEROBIC AND ANAEROBIC Blood Culture adequate volume   Culture   Final    NO GROWTH 2 DAYS Performed at [facility], [address], [place], [place] [address]    Report Status PENDING  Incomplete     Labs: BNP (last 3 results) Recent Labs    10/10/20 0333 01/06/21 1630 01/22/21 1609  BNP 432.1* 337.7* 914.7*   Basic Metabolic Panel: Recent Labs  Lab 01/24/21 0033 01/24/21 0934 01/25/21 0036 01/26/21 0107 01/27/21 0045 01/28/21 0132 01/29/21 0445 01/30/21 0111  NA 146*  --  147* 137 137 135 136 133*  K 2.8*   < > 3.6 3.1* 3.6 3.3* 3.1* 4.3  CL 115*  --  118* 110 108 108 109 107  CO2 20*  --  21* 20*  18* 21* 20* 18*  GLUCOSE 160*  --  134* 144* 135* 121* 110* 138*  BUN 21  --  24* '21 17 21 19 16  ' CREATININE 1.36*  --  1.24 1.00 1.01 1.39* 1.25* 1.12  CALCIUM 8.4*  --  8.4* 8.3* 8.2* 7.8* 7.5* 7.9*  MG 1.9  --  2.2 2.0  --   --   --   --    < > = values in this interval not displayed.   Liver Function Tests: Recent Labs  Lab 01/25/21 0036 01/26/21 0107 01/28/21 0132 01/29/21 0445 01/30/21 0111  AST 42* 82* 57* 53* 93*  ALT 22 48* 46* 48* 65*  ALKPHOS 124 356* 415* 388* 660*  BILITOT 2.0* 1.9* 1.8* 1.2 1.1  PROT 5.6* 5.3* 5.3* 5.3* 5.6*  ALBUMIN 2.7* 2.6* 2.4* 2.4* 2.5*   No results for input(s): LIPASE, AMYLASE in the last 168 hours. Recent Labs  Lab 01/24/21 0033 01/25/21 0730 01/26/21 0107  AMMONIA 83* 16 28   CBC: Recent Labs  Lab 01/27/21 1748 01/27/21 2329 01/28/21 0132 01/29/21 0445 01/30/21 0111  WBC 17.3* 13.7* 11.9* 7.8 6.9  HGB 7.9* 8.8* 7.7* 7.8* 7.7*  HCT 26.1* 28.9* 25.0* 25.5* 25.8*  MCV 80.1 79.8* 79.1* 79.9* 79.4*  PLT 105* 62* 104* 110* 128*   Cardiac Enzymes: No results for input(s): CKTOTAL, CKMB, CKMBINDEX, TROPONINI in the last 168 hours. BNP: Invalid input(s): POCBNP CBG: Recent Labs  Lab 01/29/21 1153 01/29/21 1628 01/29/21 1951 01/30/21 0757 01/30/21 1142  GLUCAP 143*  166* 170* 146* 152*   D-Dimer No results for input(s): DDIMER in the last 72 hours. Hgb A1c No results for input(s): HGBA1C in the last 72 hours. Lipid Profile No results for input(s): CHOL, HDL, LDLCALC, TRIG, CHOLHDL, LDLDIRECT in the last 72 hours. Thyroid function studies No results for input(s): TSH, T4TOTAL, T3FREE, THYROIDAB in the last 72 hours.  Invalid input(s): FREET3 Anemia work up No results for input(s): VITAMINB12, FOLATE, FERRITIN, TIBC, IRON, RETICCTPCT in the last 72 hours. Urinalysis    Component Value Date/Time   COLORURINE YELLOW 01/22/2021 1856   APPEARANCEUR CLEAR 01/22/2021 1856   LABSPEC 1.014 01/22/2021 1856   PHURINE 6.0  01/22/2021 1856   GLUCOSEU NEGATIVE 01/22/2021 1856   HGBUR NEGATIVE 01/22/2021 1856   BILIRUBINUR NEGATIVE 01/22/2021 1856   KETONESUR NEGATIVE 01/22/2021 1856   PROTEINUR 30 (A) 01/22/2021 1856   UROBILINOGEN 0.2 03/29/2014 1546   NITRITE NEGATIVE 01/22/2021 1856   LEUKOCYTESUR NEGATIVE 01/22/2021 1856   Sepsis Labs Invalid input(s): PROCALCITONIN,  WBC,  LACTICIDVEN Microbiology Recent Results (from the past 240 hour(s))  Blood Culture (routine x 2)     Status: Abnormal   Collection Time: 01/22/21  2:08 PM   Specimen: BLOOD  Result Value Ref Range Status   Specimen Description BLOOD RIGHT ANTECUBITAL  Final   Special Requests   Final    BOTTLES DRAWN AEROBIC AND ANAEROBIC Blood Culture adequate volume   Culture  Setup Time   Final    GRAM POSITIVE COCCI IN CLUSTERS IN BOTH AEROBIC AND ANAEROBIC BOTTLES CRITICAL RESULT CALLED TO, READ BACK BY AND VERIFIED WITH: Gorden Harms PHARMD '@1949'  01/23/21 EB    Culture (A)  Final    STAPHYLOCOCCUS EPIDERMIDIS THE SIGNIFICANCE OF ISOLATING THIS ORGANISM FROM A SINGLE SET OF BLOOD CULTURES WHEN MULTIPLE SETS ARE DRAWN IS UNCERTAIN. PLEASE NOTIFY THE MICROBIOLOGY DEPARTMENT WITHIN ONE WEEK IF SPECIATION AND SENSITIVITIES ARE REQUIRED. Performed at [facility], [place] [address], [place], [place] [address]    Report Status 01/25/2021 FINAL  Final  Blood Culture ID Panel (Reflexed)     Status: Abnormal   Collection Time: 01/22/21  2:08 PM  Result Value Ref Range Status   Enterococcus faecalis NOT DETECTED NOT DETECTED Final   Enterococcus Faecium NOT DETECTED NOT DETECTED Final   Listeria monocytogenes NOT DETECTED NOT DETECTED Final   Staphylococcus species DETECTED (A) NOT DETECTED Final    Comment: CRITICAL RESULT CALLED TO, READ BACK BY AND VERIFIED WITH: Gorden Harms PHARMD '@1949'  01/23/21 EB    Staphylococcus aureus (BCID) NOT DETECTED NOT DETECTED Final   Staphylococcus epidermidis DETECTED (A) NOT DETECTED Final    Comment:  Methicillin (oxacillin) resistant coagulase negative staphylococcus. Possible blood culture contaminant (unless isolated from more than one blood culture draw or clinical case suggests pathogenicity). No antibiotic treatment is indicated for blood  culture contaminants. CRITICAL RESULT CALLED TO, READ BACK BY AND VERIFIED WITH: Gorden Harms PHARMD '@1949'  01/23/21 EB    Staphylococcus lugdunensis NOT DETECTED NOT DETECTED Final   Streptococcus species NOT DETECTED NOT DETECTED Final   Streptococcus agalactiae NOT DETECTED NOT DETECTED Final   Streptococcus pneumoniae NOT DETECTED NOT DETECTED Final   Streptococcus pyogenes NOT DETECTED NOT DETECTED Final   A.calcoaceticus-baumannii NOT DETECTED NOT DETECTED Final   Bacteroides fragilis NOT DETECTED NOT DETECTED Final   Enterobacterales NOT DETECTED NOT DETECTED Final   Enterobacter cloacae complex NOT DETECTED NOT DETECTED Final   Escherichia coli NOT DETECTED NOT DETECTED Final   Klebsiella aerogenes  NOT DETECTED NOT DETECTED Final   Klebsiella oxytoca NOT DETECTED NOT DETECTED Final   Klebsiella pneumoniae NOT DETECTED NOT DETECTED Final   Proteus species NOT DETECTED NOT DETECTED Final   Salmonella species NOT DETECTED NOT DETECTED Final   Serratia marcescens NOT DETECTED NOT DETECTED Final   Haemophilus influenzae NOT DETECTED NOT DETECTED Final   Neisseria meningitidis NOT DETECTED NOT DETECTED Final   Pseudomonas aeruginosa NOT DETECTED NOT DETECTED Final   Stenotrophomonas maltophilia NOT DETECTED NOT DETECTED Final   Candida albicans NOT DETECTED NOT DETECTED Final   Candida auris NOT DETECTED NOT DETECTED Final   Candida glabrata NOT DETECTED NOT DETECTED Final   Candida krusei NOT DETECTED NOT DETECTED Final   Candida parapsilosis NOT DETECTED NOT DETECTED Final   Candida tropicalis NOT DETECTED NOT DETECTED Final   Cryptococcus neoformans/gattii NOT DETECTED NOT DETECTED Final   Methicillin resistance mecA/C DETECTED (A) NOT  DETECTED Final    Comment: CRITICAL RESULT CALLED TO, READ BACK BY AND VERIFIED WITH: Gorden Harms PHARMD '@[id number]'  01/23/21 EB Performed at [facility], [address] [place], [place] [address]   Urine culture     Status: None   Collection Time: 01/22/21  3:58 PM   Specimen: In/Out Cath Urine  Result Value Ref Range Status   Specimen Description IN/OUT CATH URINE  Final   Special Requests NONE  Final   Culture   Final    NO GROWTH Performed at [facility], [place] [address], [place], [place] [address]    Report Status 01/26/2021 FINAL  Final  Blood Culture (routine x 2)     Status: None   Collection Time: 01/22/21  4:03 PM   Specimen: BLOOD LEFT WRIST  Result Value Ref Range Status   Specimen Description BLOOD LEFT WRIST  Final   Special Requests   Final    BOTTLES DRAWN AEROBIC AND ANAEROBIC Blood Culture adequate volume   Culture   Final    NO GROWTH 5 DAYS Performed at [facility], [place] [address], [place], [place] [address]    Report Status 01/27/2021 FINAL  Final  Resp Panel by RT-PCR (Flu A&B, Covid) Nasopharyngeal Swab     Status: None   Collection Time: 01/22/21  4:50 PM   Specimen: Nasopharyngeal Swab; Nasopharyngeal(NP) swabs in vial transport medium  Result Value Ref Range Status   SARS Coronavirus 2 by RT PCR NEGATIVE NEGATIVE Final    Comment: (NOTE) SARS-CoV-2 target nucleic acids are NOT DETECTED.  The SARS-CoV-2 RNA is generally detectable in upper respiratory specimens during the acute phase of infection. The lowest concentration of SARS-CoV-2 viral copies this assay can detect is 138 copies/mL. A negative result does not preclude SARS-Cov-2 infection and should not be used as the sole basis for treatment or other patient management decisions. A negative result may occur with  improper specimen collection/handling, submission of specimen other than nasopharyngeal swab, presence of viral mutation(s) within the areas targeted by this assay, and  inadequate number of viral copies(<138 copies/mL). A negative result must be combined with clinical observations, patient history, and epidemiological information. The expected result is Negative.  Fact Sheet for Patients:  EntrepreneurPulse.com.au  Fact Sheet for Healthcare Providers:  IncredibleEmployment.be  This test is no t yet approved or cleared by the [place] FDA and  has been authorized for detection and/or diagnosis of SARS-CoV-2 by FDA under an Emergency Use Authorization (EUA). This EUA will remain  in effect (meaning this test  can be used) for the duration of the COVID-19 declaration under Section 564(b)(1) of the Act, 21 U.S.C.section 360bbb-3(b)(1), unless the authorization is terminated  or revoked sooner.       Influenza A by PCR NEGATIVE NEGATIVE Final   Influenza B by PCR NEGATIVE NEGATIVE Final    Comment: (NOTE) The Xpert Xpress SARS-CoV-2/FLU/RSV plus assay is intended as an aid in the diagnosis of influenza from Nasopharyngeal swab specimens and should not be used as a sole basis for treatment. Nasal washings and aspirates are unacceptable for Xpert Xpress SARS-CoV-2/FLU/RSV testing.  Fact Sheet for Patients: EntrepreneurPulse.com.au  Fact Sheet for Healthcare Providers: IncredibleEmployment.be  This test is not yet approved or cleared by the [place] FDA and has been authorized for detection and/or diagnosis of SARS-CoV-2 by FDA under an Emergency Use Authorization (EUA). This EUA will remain in effect (meaning this test can be used) for the duration of the COVID-19 declaration under Section 564(b)(1) of the Act, 21 U.S.C. section 360bbb-3(b)(1), unless the authorization is terminated or revoked.  Performed at [facility], [place] [address], [place], [place] [address]   MRSA PCR Screening     Status: None   Collection Time: 01/24/21  5:04 AM   Specimen: Nasal  Mucosa; Nasopharyngeal  Result Value Ref Range Status   MRSA by PCR NEGATIVE NEGATIVE Final    Comment:        The GeneXpert MRSA Assay (FDA approved for NASAL specimens only), is one component of a comprehensive MRSA colonization surveillance program. It is not intended to diagnose MRSA infection nor to guide or monitor treatment for MRSA infections. Performed at [facility], [place] [address] [place], [place] [address]   Culture, blood (routine x 2)     Status: None (Preliminary result)   Collection Time: 01/27/21  5:48 PM   Specimen: BLOOD  Result Value Ref Range Status   Specimen Description BLOOD SITE NOT SPECIFIED  Final   Special Requests   Final    BOTTLES DRAWN AEROBIC AND ANAEROBIC Blood Culture adequate volume   Culture   Final    NO GROWTH 2 DAYS Performed at [facility], [address] [place], [place] [address]    Report Status PENDING  Incomplete  Culture, blood (routine x 2)     Status: None (Preliminary result)   Collection Time: 01/27/21  5:48 PM   Specimen: BLOOD  Result Value Ref Range Status   Specimen Description BLOOD SITE NOT SPECIFIED  Final   Special Requests   Final    BOTTLES DRAWN AEROBIC AND ANAEROBIC Blood Culture adequate volume   Culture   Final    NO GROWTH 2 DAYS Performed at [facility], [address] [place], [place] [address]    Report Status PENDING  Incomplete     Time coordinating discharge: Over 30 minutes  SIGNED:   Phillips Climes, MD  [facility] 01/30/2021, 3:41 PM Pager   If 7PM-7AM, please contact night-coverage [email or web address] Password [email or web address]

## 2021-01-30 NOTE — Plan of Care (Signed)

## 2021-01-30 NOTE — TOC Transition Note (Signed)
Transition of Care [facility]) - CM/SW Discharge Note   Patient Details  Name: [name] MRN: [id number] Date of Birth: 03/19/1946  [facility] CM/SW Contact:  Carles Collet, RN Phone Number: 01/30/2021, 2:58 PM   Clinical Narrative:   Damaris Schooner w patient's son. Plan will be for home w home health services. [place] accepted referral for [facility] early next week.  Son states patient has rollator, 3/1 at home. Son will provide transportation. No other CM needs identified.      Final next level of care: [place] Barriers to Discharge: No Barriers Identified   Patient Goals and CMS Choice Patient states their goals for this hospitalization and ongoing recovery are:: Return home CMS Medicare.gov Compare Post Acute Care list provided to:: Other (Comment Required) Choice offered to / list presented to : Adult Children  Discharge Placement                       Discharge Plan and Services   Discharge Planning Services: CM Consult Post Acute Care Choice: Home Health          DME Arranged: N/A         HH Arranged: PT,OT [place] Agency: [place] Date [place]: 01/30/21 Time [place]: [place] Representative spoke with at [place]: [place] ([place]) Interventions     Readmission Risk Interventions Readmission Risk Prevention Plan 01/26/2021 08/12/2020 08/12/2020  Transportation Screening Complete - Complete  PCP or Specialist Appt within 5-7 Days - - -  Not Complete comments - - -  PCP or Specialist Appt within 3-5 Days - Complete -  Home Care Screening - - -  Medication Review (RN CM) - - -  [place] or [place] - - Complete  Social Work Consult for [place] Planning/Counseling - - Complete  Palliative Care Screening - - Not Applicable  Medication Review Press photographer) Complete - -  PCP or Specialist appointment within 3-5 days of discharge Complete - -  [place] or [place]  Complete - -  SW Recovery Care/Counseling Consult Complete - -  [place] Patient Refused - -  Some recent data might be hidden

## 2021-02-01 DIAGNOSIS — N1831 Chronic kidney disease, stage 3a: Secondary | ICD-10-CM | POA: Diagnosis not present

## 2021-02-01 DIAGNOSIS — E1122 Type 2 diabetes mellitus with diabetic chronic kidney disease: Secondary | ICD-10-CM | POA: Diagnosis not present

## 2021-02-01 DIAGNOSIS — I130 Hypertensive heart and chronic kidney disease with heart failure and stage 1 through stage 4 chronic kidney disease, or unspecified chronic kidney disease: Secondary | ICD-10-CM | POA: Diagnosis not present

## 2021-02-01 DIAGNOSIS — Z7984 Long term (current) use of oral hypoglycemic drugs: Secondary | ICD-10-CM | POA: Diagnosis not present

## 2021-02-01 DIAGNOSIS — C220 Liver cell carcinoma: Secondary | ICD-10-CM | POA: Diagnosis not present

## 2021-02-01 DIAGNOSIS — G9340 Encephalopathy, unspecified: Secondary | ICD-10-CM | POA: Diagnosis not present

## 2021-02-01 DIAGNOSIS — I5032 Chronic diastolic (congestive) heart failure: Secondary | ICD-10-CM | POA: Diagnosis not present

## 2021-02-01 DIAGNOSIS — Z9181 History of falling: Secondary | ICD-10-CM | POA: Diagnosis not present

## 2021-02-01 DIAGNOSIS — R69 Illness, unspecified: Secondary | ICD-10-CM | POA: Diagnosis not present

## 2021-02-01 LAB — CULTURE, BLOOD (ROUTINE X 2)
Culture: NO GROWTH
Culture: NO GROWTH
Special Requests: ADEQUATE
Special Requests: ADEQUATE

## 2021-02-01 NOTE — Chronic Care Management (AMB) (Signed)
  Care Management   Note  02/01/2021 Name: [name] MRN: [id number] DOB: Aug 09, 1946  [name] is a 75 y.o. year old male who is a primary care patient of [name], MD and is actively engaged with the care management team. I reached out to [name] by phone today to assist with re-scheduling a follow up visit with the RN Case Manager.  Follow up plan: Telephone appointment with care management team member scheduled for:02/19/2021  [place] Management

## 2021-02-02 ENCOUNTER — Telehealth: Payer: Self-pay

## 2021-02-02 NOTE — Telephone Encounter (Signed)
Transition Care Management Follow-up Telephone Call  Date of discharge and from where: 01/30/2021 from [facility]  How have you been since you were released from the hospital? Doing pretty good  Any questions or concerns? No  Items Reviewed:  Did the pt receive and understand the discharge instructions provided? Yes   Medications obtained and verified? Yes   Other? No   Any new allergies since your discharge? No   Dietary orders reviewed? Yes, low sodium heart healthy  Do you have support at home? Yes , son  [place] and Equipment/Supplies: Were home health services ordered? yes If so, what is the name of the agency? [facility]  Has the agency set up a time to come to the patient's home? no Were any new equipment or medical supplies ordered?  No What is the name of the medical supply agency? n/a Were you able to get the supplies/equipment? no Do you have any questions related to the use of the equipment or supplies? No  Functional Questionnaire: (I = Independent and D = Dependent) ADLs: I  Bathing/Dressing- I  Meal Prep- D  Eating- I  Maintaining continence- I  Transferring/Ambulation- I with cane  Managing Meds- D son has medication in a pill organizer  Follow up appointments reviewed:   PCP Hospital f/u appt confirmed? Yes  Scheduled to see Pricilla Holm, MD on 02/11/2021 @ 10:20 am.  [facility] f/u appt confirmed? No    Are transportation arrangements needed? No   If their condition worsens, is the pt aware to call PCP or go to the Emergency Dept.? Yes  Was the patient provided with contact information for the PCP's office or ED? Yes  Was to pt encouraged to call back with questions or concerns? Yes

## 2021-02-05 DIAGNOSIS — Z9181 History of falling: Secondary | ICD-10-CM | POA: Diagnosis not present

## 2021-02-05 DIAGNOSIS — I130 Hypertensive heart and chronic kidney disease with heart failure and stage 1 through stage 4 chronic kidney disease, or unspecified chronic kidney disease: Secondary | ICD-10-CM | POA: Diagnosis not present

## 2021-02-05 DIAGNOSIS — Z7984 Long term (current) use of oral hypoglycemic drugs: Secondary | ICD-10-CM | POA: Diagnosis not present

## 2021-02-05 DIAGNOSIS — I5032 Chronic diastolic (congestive) heart failure: Secondary | ICD-10-CM | POA: Diagnosis not present

## 2021-02-05 DIAGNOSIS — N1831 Chronic kidney disease, stage 3a: Secondary | ICD-10-CM | POA: Diagnosis not present

## 2021-02-05 DIAGNOSIS — E1122 Type 2 diabetes mellitus with diabetic chronic kidney disease: Secondary | ICD-10-CM | POA: Diagnosis not present

## 2021-02-05 DIAGNOSIS — C220 Liver cell carcinoma: Secondary | ICD-10-CM | POA: Diagnosis not present

## 2021-02-05 DIAGNOSIS — R69 Illness, unspecified: Secondary | ICD-10-CM | POA: Diagnosis not present

## 2021-02-05 DIAGNOSIS — G9340 Encephalopathy, unspecified: Secondary | ICD-10-CM | POA: Diagnosis not present

## 2021-02-08 ENCOUNTER — Telehealth: Payer: Self-pay | Admitting: Internal Medicine

## 2021-02-08 DIAGNOSIS — C220 Liver cell carcinoma: Secondary | ICD-10-CM | POA: Diagnosis not present

## 2021-02-08 DIAGNOSIS — E1122 Type 2 diabetes mellitus with diabetic chronic kidney disease: Secondary | ICD-10-CM | POA: Diagnosis not present

## 2021-02-08 DIAGNOSIS — I5032 Chronic diastolic (congestive) heart failure: Secondary | ICD-10-CM | POA: Diagnosis not present

## 2021-02-08 DIAGNOSIS — G9340 Encephalopathy, unspecified: Secondary | ICD-10-CM | POA: Diagnosis not present

## 2021-02-08 DIAGNOSIS — Z7984 Long term (current) use of oral hypoglycemic drugs: Secondary | ICD-10-CM | POA: Diagnosis not present

## 2021-02-08 DIAGNOSIS — I130 Hypertensive heart and chronic kidney disease with heart failure and stage 1 through stage 4 chronic kidney disease, or unspecified chronic kidney disease: Secondary | ICD-10-CM | POA: Diagnosis not present

## 2021-02-08 DIAGNOSIS — Z9181 History of falling: Secondary | ICD-10-CM | POA: Diagnosis not present

## 2021-02-08 DIAGNOSIS — N1831 Chronic kidney disease, stage 3a: Secondary | ICD-10-CM | POA: Diagnosis not present

## 2021-02-08 DIAGNOSIS — R69 Illness, unspecified: Secondary | ICD-10-CM | POA: Diagnosis not present

## 2021-02-08 NOTE — Telephone Encounter (Signed)
[name] with [facility] said that the patients OT eval was done and they said that the patient does not need OT. Please advise    Phone: [phone]

## 2021-02-09 DIAGNOSIS — I130 Hypertensive heart and chronic kidney disease with heart failure and stage 1 through stage 4 chronic kidney disease, or unspecified chronic kidney disease: Secondary | ICD-10-CM | POA: Diagnosis not present

## 2021-02-09 DIAGNOSIS — Z7984 Long term (current) use of oral hypoglycemic drugs: Secondary | ICD-10-CM | POA: Diagnosis not present

## 2021-02-09 DIAGNOSIS — C220 Liver cell carcinoma: Secondary | ICD-10-CM | POA: Diagnosis not present

## 2021-02-09 DIAGNOSIS — I5032 Chronic diastolic (congestive) heart failure: Secondary | ICD-10-CM | POA: Diagnosis not present

## 2021-02-09 DIAGNOSIS — Z9181 History of falling: Secondary | ICD-10-CM | POA: Diagnosis not present

## 2021-02-09 DIAGNOSIS — N1831 Chronic kidney disease, stage 3a: Secondary | ICD-10-CM | POA: Diagnosis not present

## 2021-02-09 DIAGNOSIS — G9340 Encephalopathy, unspecified: Secondary | ICD-10-CM | POA: Diagnosis not present

## 2021-02-09 DIAGNOSIS — R69 Illness, unspecified: Secondary | ICD-10-CM | POA: Diagnosis not present

## 2021-02-09 DIAGNOSIS — E1122 Type 2 diabetes mellitus with diabetic chronic kidney disease: Secondary | ICD-10-CM | POA: Diagnosis not present

## 2021-02-09 NOTE — Telephone Encounter (Signed)
Fine

## 2021-02-10 NOTE — Telephone Encounter (Signed)
Called and spoke to [name], she verbalized understanding

## 2021-02-11 ENCOUNTER — Ambulatory Visit (INDEPENDENT_AMBULATORY_CARE_PROVIDER_SITE_OTHER): Payer: Medicare HMO | Admitting: Internal Medicine

## 2021-02-11 ENCOUNTER — Encounter: Payer: Self-pay | Admitting: Internal Medicine

## 2021-02-11 ENCOUNTER — Other Ambulatory Visit: Payer: Self-pay

## 2021-02-11 ENCOUNTER — Other Ambulatory Visit: Payer: Self-pay | Admitting: Internal Medicine

## 2021-02-11 ENCOUNTER — Telehealth: Payer: Self-pay

## 2021-02-11 VITALS — BP 122/68 | HR 78 | Temp 98.0°F | Ht 68.0 in | Wt 230.0 lb

## 2021-02-11 DIAGNOSIS — D500 Iron deficiency anemia secondary to blood loss (chronic): Secondary | ICD-10-CM | POA: Diagnosis not present

## 2021-02-11 DIAGNOSIS — E118 Type 2 diabetes mellitus with unspecified complications: Secondary | ICD-10-CM | POA: Diagnosis not present

## 2021-02-11 DIAGNOSIS — K7031 Alcoholic cirrhosis of liver with ascites: Secondary | ICD-10-CM

## 2021-02-11 DIAGNOSIS — I5033 Acute on chronic diastolic (congestive) heart failure: Secondary | ICD-10-CM | POA: Diagnosis not present

## 2021-02-11 DIAGNOSIS — R69 Illness, unspecified: Secondary | ICD-10-CM | POA: Diagnosis not present

## 2021-02-11 LAB — BASIC METABOLIC PANEL
BUN: 17 mg/dL (ref 6–23)
CO2: 26 mEq/L (ref 19–32)
Calcium: 8.9 mg/dL (ref 8.4–10.5)
Chloride: 105 mEq/L (ref 96–112)
Creatinine, Ser: 0.89 mg/dL (ref 0.40–1.50)
GFR: 84.17 mL/min (ref >60.00)
Glucose, Bld: 129 mg/dL — ABNORMAL HIGH (ref 70–99)
Potassium: 4 mEq/L (ref 3.5–5.1)
Sodium: 137 mEq/L (ref 135–145)

## 2021-02-11 LAB — CBC WITH DIFFERENTIAL/PLATELET
Basophils Absolute: 0 10*3/uL (ref 0.0–0.1)
Basophils Relative: 1.1 % (ref 0.0–3.0)
Eosinophils Absolute: 0.1 10*3/uL (ref 0.0–0.7)
Eosinophils Relative: 4.2 % (ref 0.0–5.0)
HCT: 22.3 % — CL (ref 39.0–52.0)
Hemoglobin: 7 g/dL — CL (ref 13.0–17.0)
Lymphocytes Relative: 16 % (ref 12.0–46.0)
Lymphs Abs: 0.5 10*3/uL — ABNORMAL LOW (ref 0.7–4.0)
MCHC: 31.5 g/dL (ref 30.0–36.0)
MCV: 75.1 fl — ABNORMAL LOW (ref 78.0–100.0)
Monocytes Absolute: 0.2 10*3/uL (ref 0.1–1.0)
Monocytes Relative: 7.9 % (ref 3.0–12.0)
Neutro Abs: 2 10*3/uL (ref 1.4–7.7)
Neutrophils Relative %: 70.8 % (ref 43.0–77.0)
Platelets: 118 10*3/uL — ABNORMAL LOW (ref 150.0–400.0)
RBC: 2.97 Mil/uL — ABNORMAL LOW (ref 4.22–5.81)
RDW: 23.6 % — ABNORMAL HIGH (ref 11.5–15.5)
WBC: 2.9 10*3/uL — ABNORMAL LOW (ref 4.0–10.5)

## 2021-02-11 LAB — HEPATIC FUNCTION PANEL
ALT: 30 U/L (ref 0–53)
AST: 38 U/L — ABNORMAL HIGH (ref 0–37)
Albumin: 3 g/dL — ABNORMAL LOW (ref 3.5–5.2)
Alkaline Phosphatase: 325 U/L — ABNORMAL HIGH (ref 39–117)
Bilirubin, Direct: 0.3 mg/dL (ref 0.0–0.3)
Total Bilirubin: 1.1 mg/dL (ref 0.2–1.2)
Total Protein: 6 g/dL (ref 6.0–8.3)

## 2021-02-11 NOTE — Assessment & Plan Note (Addendum)
Not checking sugars recently which is acceptable. Taking amaryl 2 mg daily and metformin 850 mg BID.

## 2021-02-11 NOTE — Assessment & Plan Note (Signed)
Could be having flare today as weight has increased from 225 to 230 in the last week. Checking BMP and LFTs. Will have patient call back to let us know how much lasix he is taking. Could either increase or add spironolactone to help with fluid. Advised to use compression stockings for legs.

## 2021-02-11 NOTE — Patient Instructions (Signed)
Can you call us back to let us know how many of the lasix (furosemide) you are taking daily? We may increase this depending on your lab results.

## 2021-02-11 NOTE — Progress Notes (Signed)
   Subjective:   Patient ID: [name], male    DOB: [birth year], 75 y.o.   MRN: [id number]  HPI The patient is a 75 YO man coming in for hospital follow up (admitted for severe anemia, transfusion done, then re-admitted for possible hepatic encephalopathy, has had TIPS done within the last month during an admission). He denies blood in stools or dark stools since leaving hospital. He is having more swelling in his legs not sure if this happened in hospital or since leaving. He did not take lactulose in the last day or so. His son does manage his medications. He is not sure if he is taking 1 or 2 lasix daily. Thinks maybe 2 but D/C list states 1 daily. He denies abdominal pain, fevers or chills. He is having weight gain and weighs daily. Today weight 230 and last week was averaging 225.   PMH, [facility], social history reviewed and updated. D/C summary reviewed and D/C medication list reconciled with patient.   Review of Systems  Constitutional:  Positive for activity change.  HENT: Negative.    Eyes: Negative.   Respiratory:  Negative for cough, chest tightness and shortness of breath.   Cardiovascular:  Positive for leg swelling. Negative for chest pain and palpitations.  Gastrointestinal:  Positive for abdominal distention. Negative for abdominal pain, constipation, diarrhea, nausea and vomiting.  Musculoskeletal:  Positive for myalgias.  Skin: Negative.   Neurological: Negative.   Psychiatric/Behavioral: Negative.     Objective:  Physical Exam Constitutional:      Appearance: He is well-developed. He is obese.  HENT:     Head: Normocephalic and atraumatic.  Cardiovascular:     Rate and Rhythm: Normal rate and regular rhythm.  Pulmonary:     Effort: Pulmonary effort is normal. No respiratory distress.     Breath sounds: Normal breath sounds. No wheezing or rales.  Abdominal:     General: Bowel sounds are normal. There is distension.     Palpations: Abdomen is soft.     Tenderness:  There is no abdominal tenderness. There is no rebound.  Musculoskeletal:     Cervical back: Normal range of motion.     Right lower leg: Edema present.     Left lower leg: Edema present.     Comments: 3+ pitting edema to the thighs bilaterally, no cellulitis noted  Skin:    General: Skin is warm and dry.  Neurological:     Mental Status: He is alert and oriented to person, place, and time.     Coordination: Coordination abnormal.     Comments: Cane and slow gait    Vitals:   02/11/21 1024  BP: 122/68  Pulse: 78  Temp: 98 F (36.7 C)  TempSrc: Oral  SpO2: 98%  Weight: 230 lb (104.3 kg)  Height: 5\' 8"  (1.727 m)    This visit occurred during the SARS-CoV-2 public health emergency.  Safety protocols were in place, including screening questions prior to the visit, additional usage of staff PPE, and extensive cleaning of exam room while observing appropriate contact time as indicated for disinfecting solutions.   Assessment & Plan:

## 2021-02-11 NOTE — Telephone Encounter (Signed)
LVM for pt notifying him that blood counts are stable & PCP would like torepeat in 2-3 weeks. Lab order confirmed.

## 2021-02-11 NOTE — Telephone Encounter (Signed)
-----   Message from [name], MD sent at 02/11/2021  2:22 PM EDT ----- Please call and let him know blood counts are overall stable Hg 7. We would like to recheck this in 2-3 weeks.

## 2021-02-11 NOTE — Assessment & Plan Note (Signed)
Checking LFTs and BMP today. He has had TIPS in the last month. Also an episode of hepatic encephalopathy which is not present today.

## 2021-02-11 NOTE — Assessment & Plan Note (Signed)
Checking CBC with diff today. He has not had recurrent bleeding noticeable since leaving hospital.

## 2021-02-16 ENCOUNTER — Telehealth: Payer: Self-pay | Admitting: *Deleted

## 2021-02-16 NOTE — Telephone Encounter (Signed)
On 02/12/2021, faxed signed order for OT evaluate to [facility]. Confirmation rec'd.

## 2021-02-18 ENCOUNTER — Other Ambulatory Visit: Payer: Medicare HMO

## 2021-02-18 ENCOUNTER — Inpatient Hospital Stay: Admission: RE | Admit: 2021-02-18 | Payer: Medicare HMO | Source: Ambulatory Visit

## 2021-02-19 ENCOUNTER — Ambulatory Visit (INDEPENDENT_AMBULATORY_CARE_PROVIDER_SITE_OTHER): Payer: Medicare HMO | Admitting: *Deleted

## 2021-02-19 DIAGNOSIS — I5032 Chronic diastolic (congestive) heart failure: Secondary | ICD-10-CM

## 2021-02-19 DIAGNOSIS — D649 Anemia, unspecified: Secondary | ICD-10-CM | POA: Diagnosis not present

## 2021-02-19 NOTE — Chronic Care Management (AMB) (Cosign Needed)
Chronic Care Management   CCM RN Visit Note  02/19/2021 Name: [name] MRN: [id number] DOB: June 09, 1946  Subjective: [name] is a 75 y.o. year old male who is a primary care patient of John Koch, MD. The care management team was consulted for assistance with disease management and care coordination needs.    Engaged with patient/ caregiver- son on [place] by telephone for follow up visit in response to provider referral for case management and/or care coordination services.   Consent to Services:  The patient was given information about Chronic Care Management services, agreed to services, and gave verbal consent prior to initiation of services.  Please see initial visit note for detailed documentation.  Patient agreed to services and verbal consent obtained.   Assessment: Review of patient past medical history, allergies, medications, health status, including review of consultants reports, laboratory and other test data, was performed as part of comprehensive evaluation and provision of chronic care management services.   SDOH (Social Determinants of Health) assessments and interventions performed:  SDOH Interventions    Flowsheet Row Most Recent Value  SDOH Interventions   Food Insecurity Interventions Intervention Not Indicated  Transportation Interventions Intervention Not Indicated  [Reports caregiver/ son [name] continues to provide transportation to provider appointments,  errands]       CCM Care Plan  Allergies  Allergen Reactions   Fluzone Quadrivalent [place] Other (See Comments)    The patient stated, in 10/2020: "I am not taking any more flu shots. It liked to have killed me."    Outpatient Encounter Medications as of 02/19/2021  Medication Sig Note   albuterol (VENTOLIN HFA) 108 (90 Base) MCG/ACT inhaler Inhale 2 puffs into the lungs every 6 (six) hours as needed for wheezing or shortness of breath. 02/19/2021: 02/19/21:  Caregiver/ son reports patient has used only twice since hospital discharge 01/30/21   furosemide (LASIX) 40 MG tablet Take 1 tablet (40 mg total) by mouth daily.    glimepiride (AMARYL) 2 MG tablet TAKE 1 TABLET(2 MG) BY MOUTH DAILY BEFORE BREAKFAST (Patient taking differently: Take 2 mg by mouth daily with breakfast.)    lactulose (CHRONULAC) 10 GM/15ML solution Take 30 mLs (20 g total) by mouth 3 (three) times daily.    metFORMIN (GLUCOPHAGE) 850 MG tablet TAKE 1 TABLET(850 MG) BY MOUTH TWICE DAILY WITH A MEAL (Patient taking differently: Take 850 mg by mouth 2 (two) times daily with a meal.)    nitroGLYCERIN (NITROSTAT) 0.4 MG SL tablet Place 1 tablet (0.4 mg total) under the tongue every 5 (five) minutes as needed for chest pain. 02/19/2021: 02/19/21: Son/ caregiver reports patient has not needed recently    umeclidinium-vilanterol (ANORO ELLIPTA) 62.5-25 MCG/INH AEPB Inhale 1 puff into the lungs daily.    Accu-Chek Softclix Lancets lancets USE AS DIRECTED TO TEST BLOOD SUGAR FOUR TIMES DAILY (Patient not taking: Reported on 02/19/2021)    blood glucose meter kit and supplies Dispense based on patient and insurance preference. Use up to four times daily as directed. (FOR ICD-10 E10.9, E11.9). (Patient not taking: Reported on 02/19/2021)    hydrocortisone (ANUSOL-HC) 2.5 % rectal cream Place 1 application rectally 2 (two) times daily. (Patient not taking: Reported on 02/19/2021)    pantoprazole (PROTONIX) 40 MG tablet Take 1 tablet (40 mg total) by mouth 2 (two) times daily. (Patient not taking: No sig reported) 02/19/2021: Patient reports the pharmacy never received a prescription from the doctor for this    No  facility-administered encounter medications on file as of 02/19/2021.    Patient Active Problem List   Diagnosis Date Noted   Hepatic encephalopathy ([place]) 01/22/2021   GI bleeding 09/10/2020   CKD (chronic kidney disease), stage II    Hepatocellular carcinoma ([place]) 08/09/2020   Routine  general medical examination at a health care facility 05/06/2020   Iron deficiency anemia    Symptomatic anemia 04/15/2020   Pulmonary hypertension, unspecified ([place]) 03/11/2020   Asthma, mild intermittent 88/89/1694   Alcoholic cirrhosis of liver with ascites ([place]) 12/27/2019   (HFpEF) heart failure with preserved ejection fraction ([place]) 12/24/2018   CKD (chronic kidney disease), stage III ([place]) 12/18/2017   Peptic ulcer disease 12/18/2017   GI bleed 09/29/2017   Morbid obesity ([place]) 02/05/2016   S/P CABG x 3 03/31/2014   Hepatic cirrhosis ([place]) 02/27/2014   Melena 08/14/2012   Hyperlipidemia with target LDL less than 70 06/29/2012   Thrombocytopenia ([place]) 06/29/2012   Coronary artery disease involving native coronary artery of native heart without angina pectoris    OSA (obstructive sleep apnea)    Type 2 diabetes with complication ([place]) 50/38/8828   GERD without esophagitis 08/04/2010   History of gout    Hypertensive heart disease     Conditions to be addressed/monitored: CHF and chronic anemia  Care Plan : Heart Failure (Adult)  Updates made by Knox Royalty, RN since 02/19/2021 12:00 AM     Problem: Symptom Exacerbation (Heart Failure)   Priority: Medium     Long-Range Goal: Symptom Exacerbation Prevented or Minimized   Start Date: 12/10/2020  Expected End Date: 06/12/2021  This Visit's Progress: On track  Recent Progress: On track  Priority: Medium  Note:   Current Barriers:  Knowledge deficits related to basic heart failure pathophysiology and self care management Unable to independently read, write, understand health conditions and advice- relies on family to manage health needs/ affairs Does not adhere to provider recommendations re: daily weight monitoring/ recording  Financial strain- reports difficulty affording "some medications:" CCM Pharmacy referral placed  Nurse Case Manager Clinical Goal(s): Goal re-established and extended 02/19/21 around ongoing  multiple hospital re-admissions (3 inpatient hospitalizations since March 2022) Over the next 12 months, patient/ caregiver will:  Verbalize consistent adherence to daily weight monitoring/ recording Verbalize understanding of Heart Failure Action Plan; when to call care providers vs seek emergent/ urgent care Take all mediations as prescribed/ work with CCM pharmacy team to address states pharmacy needs Interventions:  Collaboration with John Koch, MD regarding development and update of comprehensive plan of care as evidenced by provider attestation and co-signature Inter-disciplinary care team collaboration (see longitudinal plan of care) Chart reviewed including relevant office/ hospital notes, recent/ upcoming scheduled appointments, and lab results Reviewed recent hospitalizations with patient and caregiver; confirmed no current clinical concerns although patient/ caregiver continue to report bilateral LE swelling, "unchanged" from PCP office visit 02/11/21: encouraged them to notify PCP if swelling becomes worse or if patient develops additional signs/ symptoms of yellow CHF zone Reviewed recent post-hospital discharge office visit with PCP: confirmed patient taking Lasix 40 mg QD; taking Lactulose as prescribed post-hospital discharge: TID Discussed with caregiver and patient importance of taking ALL medications as prescribed; medication review completed and medication list updated in EHR accordingly; made PCP aware that patient is NOT currently taking pantoprazole, for clarification purposes Confirmed patient has been monitoring/ recording daily weights at home: reports consistent ranges between 227-234 lbs, post-hospital discharge; reports weight yesterday of 229 lbs; reports forgot  to write down weight today- encouraged patient and caregiver to write down weight each morning Reiterated previously provided education around basic overview and discussion of pathophysiology of Heart  Failure along with importance/ rationale for daily weight monitoring/ recording at home- patient and caregiver will both benefit from ongoing reinforcement of same Reiterated previously provided education around CHF zones and weight gain guidelines / corresponding action plan and will re-mail previously provided educational material as they report they have lost  Confirmed patient continues trying to follow low salt diet- positive reinforcement provided Reviewed last documented cardiology office visit with patient/ caregiver (from 04/2020): encouraged caregiver to make an appointment with cardiologist, given patient's multiple recent hospitalizations Discussed plans with patient for ongoing care management follow up and provided patient with direct contact information for care management team Self-Care Activities:  Takes Heart Failure Medications as prescribed under direction of family caregivers Inconsistently monitors daily weights at home: reports weighs "a couple of times every week" Caregiver able to verbalize action plan to notify MD of 3 lb weight gain over night or 5 lb in a week- caregiver works and tries to review patient's weights regularly Adheres to low sodium diet  Patient Goals:  Weigh myself daily and write it down on paper so you can easily keep up with weight gain from day to day: we will review your weights at home during each of our phone calls Call office if I gain more than 2 pounds in one day or 5 pounds in one week Be sure to take your fluid pill (Lasix) and the medication for your liver (Lactulose) exactly as prescribed: Take ALL of your medications as prescribed Please read over the attached information about how to manage your heart function at home by monitoring your daily weights and symptoms Continue limiting the salt in your diet Watch for swelling in feet, ankles and legs every day It appears to have been awhile since you have visited with your cardiologist: please  schedule an appointment with your cardiologist, given your many recent hospital visits  Follow Up Plan:  Telephone follow up appointment with caregiver/ care management team member scheduled for: Friday, March 05, 2021 at 1:00 pm The patient has been provided with contact information for the care management team and has been advised to call with any health related questions or concerns.      Care Plan : General Plan of Care (Adult)  Updates made by Knox Royalty, RN since 02/19/2021 12:00 AM     Problem: Anemia/ GI bleeding- Health Promotion or Disease Self-Management (General Plan of Care)   Priority: Medium     Long-Range Goal: Self-Management Plan Developed   Start Date: 12/10/2020  Expected End Date: 02/19/2022  This Visit's Progress: On track  Recent Progress: On track  Priority: Medium  Note:   Current Barriers:  Ineffective Self Health Maintenance of recurrent GI bleeding/ anemia Unable to independently read, write, reports difficulty understanding health problems/ advice- relies on family members to help; family handles all of patient's health activities, including medication administration at home Reports issues with memory Multiple recent inpatient hospital readmissions for ongoing symptomatic anemia/ GI bleeding, and hepatic encephalopathy (3 hospitalizations since March 2022) Clinical Goal(s):  Collaboration with John Koch, MD regarding development and update of comprehensive plan of care as evidenced by provider attestation and co-signature Inter-disciplinary care team collaboration (see longitudinal plan of care) Over the next 12 months, patient/ caregiver will: (goal re-established and extended 02/19/21) work with care management team to  address care coordination and chronic disease management needs related to frequent recurrent episodes GI bleeding requiring hospitalization care coordination needs for: medication reconciliation and possible medication assistance     Interventions:  Evaluation of current treatment plan related to , Medication procurement, Literacy concerns, and Memory Deficits self-management and patient's adherence to plan as established by provider. Collaboration with John Koch, MD regarding development and update of comprehensive plan of care as evidenced by provider attestation       and co-signature Inter-disciplinary care team collaboration (see longitudinal plan of care) Chart reviewed including relevant office and hospital notes, upcoming scheduled appointments, and lab results Discussed current  clinical condition with patient/ caregiver and confirmed no current clinical or medication concerns; confirmed patient's family/ caregiver manages all of patient's health affairs and  medications at home and report adherence to medication regimen Full medication review completed with caregiver and patient today: caregiver reports patient not taking pantoprazole- states this medication "was never sent to pharmacy for filling;" care coordination call to outpatient pharmacy indicates that protonix was ordered for 15 days only after hospital visit in late winter-- no refills available; will message Dr. Sharlet Salina to see if she wishes to re-order this medication.  Caregiver/ patient provide verbal confirmation that patient is taking lactulose tid and lasix 40 mg QD- positive reinforcement provided Discussed missed appointment for abdominal US yesterday- they report they forgot about appointment; encouraged prompt re-scheduling of same, provided phone number, caregiver reports will do Reiterated signs/ symptoms along with action plan for GI bleeding/ anemia: will re-provide previously provided printed educational material, as patient/ caregiver report they have lost; discussed reasons to call providers vs seek emergent/ urgent care Reviewed upcoming provider appointments with patient/ caregiver: confirmed caregiver aware of upcoming scheduled  lab appointment for CBC on 02/25/21: explained importance of remembering and attending this appointment; caregiver reports "on my calendar" and verbalizes plans at attend Discussed plans with patient for ongoing care management follow up and provided patient with direct contact information for care management team Self Care Activities:  Patient/ caregiver verbalizes understanding of plan to promptly notify care provider of development of signs/ symptoms GI bleeding Attends all scheduled provider appointments- confirmed son provides transportation Patient Goals: Please remember to go to the lab to have lab work for your blood count on Thursday 02/25/21 I have asked Dr. Sharlet Salina to let you know whether or not Gustavo should be taking the pantoprazole (Protonix): if she decides to order this medication, the office nurse or your pharmacy will notify you Please call to re-schedule the appointment that was missed 02/18/21 for his abdominal ultrasound: [facility]: [phone] Please continue to eat a low-sodium, low sugar/ low carbohydrate diet: you may want to stick to bland foods since you have had episodes of stomach bleeding Discuss your treatment options with the doctor or nurse and make shared treatment decisions with doctor  Know the signs/ symptoms of GI (stomach- gastrointestinal) bleeding: please read over the attached information about signs to look for-- if you develop these signs and symptoms, promptly contact you care providers so you can hopefully avoid having to go back to the hospital Please have your family members read over the attached educational information with you- it is important to take action early when you develop signs/ symptoms of bleeding Follow Up Plan:  Telephone follow up appointment with caregiver/ son [name] and care management team member scheduled for: Friday March 05, 2021 at 1:00 pm The patient has been provided with contact information for  the care management team  and has been advised to call with any health related questions or concerns.      Plan: Telephone follow up appointment with care management team member scheduled for:  Friday, March 05, 2021 at 1:00 pm The patient has been provided with contact information for the care management team and has been advised to call with any health related questions or concerns.   Oneta Rack, RN, BSN, [facility] RN Care Coordination- [place] [phone]: direct office [phone]: mobile

## 2021-02-19 NOTE — Patient Instructions (Signed)
Visit Information  Tramane and [name], it was nice talking with you today.   Please read over the attached information, and we will will talk about this during our next phone call   I look forward to talking to you again for an update on Friday March 05, 2021 at 1:00 pm- please be listening out for my call that day.  I will call as close to 1:00 pm as possible; I look forward to hearing about your progress.   Please don't hesitate to contact me if I can be of assistance to you before our next scheduled appointment.   Oneta Rack, RN, BSN, [facility] RN Care Coordination- [place] [phone]: direct office [phone]: mobile   PATIENT GOALS:  Goals Addressed             This Visit's Progress    "Keep my blood count in normal limits so I don't have to go back to the hospital"   On track    Timeframe:  Long-Range Goal Priority:  High Start Date:       12/10/20                       Expected End Date:     02/19/22: goal re-established and extended today                  Follow Up Date 03/05/21   Please remember to go to the lab to have lab work for your blood count on Thursday 02/25/21 I have asked Dr. Sharlet Salina to let you know whether or not Jaksen should be taking the pantoprazole (Protonix): if she decides to order this medication, the office nurse or your pharmacy will notify you Please call to re-schedule the appointment that was missed 02/18/21 for his abdominal ultrasound: [facility]: [phone] Please continue to eat a low-sodium, low sugar/ low carbohydrate diet: you may want to stick to bland foods since you have had episodes of stomach bleeding Discuss your treatment options with the doctor or nurse and make shared treatment decisions with doctor  Know the signs/ symptoms of GI (stomach- gastrointestinal) bleeding: please read over the attached information about signs to look for-- if you develop these signs and symptoms, promptly  contact you care providers so you can hopefully avoid having to go back to the hospital Please have your family members read over the attached educational information with you- it is important to take action early when you develop signs/ symptoms of bleeding   Why is this important?   Having a long-term illness can be scary.  It can also be stressful for you and your caregiver.  These steps may help.          Track and Manage Fluids and Swelling-Heart Failure   On track    Timeframe:  Long-Range Goal Priority:  Medium Start Date:        12/10/20                     Expected End Date:   02/19/22- goal re-established and extended                    Follow Up Date 03/05/21   Weigh myself daily and write it down on paper so you can easily keep up with weight gain from day to day: we will review your weights at home during each of our phone calls Call office if  I gain more than 2 pounds in one day or 5 pounds in one week Be sure to take your fluid pill (Lasix) and the medication for your liver (Lactulose) exactly as prescribed: Take ALL of your medications as prescribed Please read over the attached information about how to manage your heart function at home by monitoring your daily weights and symptoms Continue limiting the salt in your diet Watch for swelling in feet, ankles and legs every day It appears to have been awhile since you have visited with your cardiologist: please schedule an appointment with your cardiologist, given your many recent hospital visits   Why is this important?   It is important to check your weight daily and watch how much salt and liquids you have.  It will help you to manage your heart failure.            The patient verbalized understanding of instructions, educational materials, and care plan provided today and agreed to receive a mailed copy of patient instructions, educational materials, and care plan.  Telephone follow up appointment with care  management team member scheduled for: The patient has been provided with contact information for the care management team and has been advised to call with any health related questions or concerns.   Oneta Rack, RN, BSN, [facility] RN Care Coordination- [place] [phone]: direct office [phone]: mobile       Heart Failure Action Plan A heart failure action plan helps you understand what to do when you have symptoms of heart failure. Your action plan is a color-coded plan that lists the symptoms to watch for and indicates what actions to take. If you have symptoms in the red zone, you need medical care right away. If you have symptoms in the yellow zone, you are having problems. If you have symptoms in the green zone, you are doing well. Follow the plan that was created by you and your health care provider. Reviewyour plan each time you visit your health care provider. Red zone These signs and symptoms mean you should get medical help right away: You have trouble breathing when resting. You have a dry cough that is getting worse. You have swelling or pain in your legs or abdomen that is getting worse. You suddenly gain more than 2-3 lb (0.9-1.4 kg) in 24 hours, or more than 5 lb (2.3 kg) in a week. This amount may be more or less depending on your condition. You have trouble staying awake or you feel confused. You have chest pain. You do not have an appetite. You pass out. You have worsening sadness or depression. If you have any of these symptoms, call your local emergency services (911 in the [place] right away. Do not drive yourself to the hospital. Yellow zone These signs and symptoms mean your condition may be getting worse and you should make some changes: You have trouble breathing when you are active, or you need to sleep with your head raised on extra pillows to help you breathe. You have swelling in your legs or abdomen. You gain  2-3 lb (0.9-1.4 kg) in 24 hours, or 5 lb (2.3 kg) in a week. This amount may be more or less depending on your condition. You get tired easily. You have trouble sleeping. You have a dry cough. If you have any of these symptoms: Contact your health care provider within the next day. Your health care provider may adjust your medicines. Green zone These  signs mean you are doing well and can continue what you are doing: You do not have shortness of breath. You have very little swelling or no new swelling. Your weight is stable (no gain or loss). You have a normal activity level. You do not have chest pain or any other new symptoms. Follow these instructions at home: Take over-the-counter and prescription medicines only as told by your health care provider. Weigh yourself daily. Your target weight is __________ lb (__________ kg). Call your health care provider if you gain more than __________ lb (__________ kg) in 24 hours, or more than __________ lb (__________ kg) in a week. Health care provider name: _____________________________________________________ Health care provider phone number: _____________________________________________________ Eat a heart-healthy diet. Work with a diet and nutrition specialist (dietitian) to create an eating plan that is best for you. Keep all follow-up visits. This is important. Where to find more information American Heart Association: www.heart.org Summary A heart failure action plan helps you understand what to do when you have symptoms of heart failure. Follow the action plan that was created by you and your health care provider. Get help right away if you have any symptoms in the red zone. This information is not intended to replace advice given to you by your health care provider. Make sure you discuss any questions you have with your healthcare provider. Document Revised: 03/30/2020 Document Reviewed: 03/30/2020 Elsevier Patient Education  2022  [place].  Gastrointestinal Bleeding Gastrointestinal (GI) bleeding is bleeding somewhere along the path that food travels through the body (digestive tract). This path is anywhere between the mouth and the opening of the butt (anus). You may have blood in your poop (stool) or have black poop. If you throw up (vomit), there may be blood in it. This condition can be mild, serious, or even life-threatening. If you have alot of bleeding, you may need to stay in the hospital. What are the causes? This condition may be caused by: Irritation and swelling of the esophagus (esophagitis). The esophagus is part of the body that moves food from your mouth to your stomach. Swollen veins in the butt (hemorrhoids). Areas of painful tearing in the opening of the butt (anal fissures). These are often caused by passing hard poop. Pouches that form on the colon over time (diverticulosis). Irritation and swelling (diverticulitis) in areas where pouches have formed on the colon. Growths (polyps) or cancer. Colon cancer often starts out as growths that are not cancer. Irritation of the stomach lining (gastritis). Sores (ulcers) in the stomach. What increases the risk? You are more likely to develop this condition if you: Have a certain type of infection in your stomach (Helicobacter pylori infection). Take certain medicines. Smoke. Drink alcohol. What are the signs or symptoms? Common symptoms of this condition include: Throwing up (vomiting) material that has bright red blood in it. It may look like coffee grounds. Changes in your poop. The poop may: Have red blood in it. Be black, look like tar, and smell stronger than normal. Be red. Pain or cramping in the belly (abdomen). How is this treated? Treatment for this condition depends on the cause of the bleeding. For example: Sometimes, the bleeding can be stopped during a procedure that is done to find the problem (endoscopy or  colonoscopy). Medicines can be used to: Help control irritation, swelling, or infection. Reduce acid in your stomach. Certain problems can be treated with: Creams. Medicines that are put in the butt (suppositories). Warm baths. Surgery is sometimes needed.  If you lose a lot of blood, you may need a blood transfusion. If bleeding is mild, you may be allowed to go home. If there is a lot ofbleeding, you will need to stay in the hospital. Follow these instructions at home:  Take over-the-counter and prescription medicines only as told by your doctor. Eat foods that have a lot of fiber in them. These foods include beans, whole grains, and fresh fruits and vegetables. You can also try eating 1-3 prunes each day. Drink enough fluid to keep your pee (urine) pale yellow. Keep all follow-up visits as told by your doctor. This is important. Contact a doctor if: Your symptoms do not get better. Get help right away if: Your bleeding does not stop. You feel dizzy or you pass out (faint). You feel weak. You have very bad cramps in your back or belly. You pass large clumps of blood (clots) in your poop. Your symptoms are getting worse. You have chest pain or fast heartbeats. Summary GI bleeding is bleeding somewhere along the path that food travels through the body (digestive tract). This bleeding can be caused by many things. Treatment depends on the cause of the bleeding. Take medicines only as told by your doctor. Keep all follow-up visits as told by your doctor. This is important. This information is not intended to replace advice given to you by your health care provider. Make sure you discuss any questions you have with your healthcare provider. Document Revised: 03/28/2018 Document Reviewed: 03/28/2018 Elsevier Patient Education  2022 [place] patient verbalized understanding of instructions, educational materials, and care plan provided today and agreed to receive a mailed  copy of patient instructions, educational materials, and care plan.  Telephone follow up appointment with care management team member scheduled for:  Friday March 05, 2021 at 1:00 pm The patient has been provided with contact information for the care management team and has been advised to call with any health related questions or concerns.   Oneta Rack, RN, BSN, [facility] RN Care Coordination- [place] [phone]: direct office [phone]: mobile

## 2021-02-23 ENCOUNTER — Telehealth: Payer: Self-pay | Admitting: *Deleted

## 2021-02-23 ENCOUNTER — Encounter: Payer: Self-pay | Admitting: *Deleted

## 2021-02-23 ENCOUNTER — Other Ambulatory Visit: Payer: Self-pay | Admitting: Internal Medicine

## 2021-02-23 DIAGNOSIS — R69 Illness, unspecified: Secondary | ICD-10-CM | POA: Diagnosis not present

## 2021-02-23 DIAGNOSIS — I130 Hypertensive heart and chronic kidney disease with heart failure and stage 1 through stage 4 chronic kidney disease, or unspecified chronic kidney disease: Secondary | ICD-10-CM | POA: Diagnosis not present

## 2021-02-23 DIAGNOSIS — E1122 Type 2 diabetes mellitus with diabetic chronic kidney disease: Secondary | ICD-10-CM | POA: Diagnosis not present

## 2021-02-23 DIAGNOSIS — Z7984 Long term (current) use of oral hypoglycemic drugs: Secondary | ICD-10-CM | POA: Diagnosis not present

## 2021-02-23 DIAGNOSIS — C220 Liver cell carcinoma: Secondary | ICD-10-CM | POA: Diagnosis not present

## 2021-02-23 DIAGNOSIS — G9340 Encephalopathy, unspecified: Secondary | ICD-10-CM | POA: Diagnosis not present

## 2021-02-23 DIAGNOSIS — I5032 Chronic diastolic (congestive) heart failure: Secondary | ICD-10-CM | POA: Diagnosis not present

## 2021-02-23 DIAGNOSIS — N1831 Chronic kidney disease, stage 3a: Secondary | ICD-10-CM | POA: Diagnosis not present

## 2021-02-23 DIAGNOSIS — Z9181 History of falling: Secondary | ICD-10-CM | POA: Diagnosis not present

## 2021-02-23 MED ORDER — PANTOPRAZOLE SODIUM 40 MG PO TBEC: 40.0000 mg | DELAYED_RELEASE_TABLET | Freq: Two times a day (BID) | ORAL | 1 refills | Status: DC

## 2021-02-23 NOTE — Telephone Encounter (Signed)
  Chronic Care Management   Follow Up Note   02/23/2021 Name: [name] MRN: [id number] DOB: December 01, 1945  Referred by: [name], MD Reason for referral : Chronic Care Management (CCM RN CM Follow up call- protonix clarification)  An unsuccessful telephone outreach was attempted today. The patient was referred to the case management team for assistance with care management and care coordination.   Received follow up from PCP, Dr. Sharlet Salina on 02/23/21 providing clarification from 02/19/21 note advising her that patient has not been taking protonix: PCP clarified today that patient should be taking protonix 40 mg BID; left voice mail message for patient's son/ caregiver [name] on [place], informing him of same and advising that the medication has been re-filled at his outpatient pharmacy; advised caregiver to call back for any questions and encouraged him to pick up this medication promptly from OP pharmacy and for patient to begin taking as soon as they have medication.  Attempted second call to alternate number to inform of same, received automated outgoing voice message stating that phone has a voice mail box that has not been set up- unable to leave voice message on that number  Follow Up Plan:  CCM RN CM Follow up call, as previously scheduled, on Friday, March 05, 2021 at 1:00 pm The patient has been provided with contact information for the care management team and has been advised to call with any health related questions or concerns.   Oneta Rack, RN, BSN, [facility] RN Care Coordination- [place] [phone]: direct office [phone]: mobile

## 2021-02-24 ENCOUNTER — Telehealth: Payer: Self-pay | Admitting: Internal Medicine

## 2021-02-24 NOTE — Telephone Encounter (Signed)
[name] from [facility] has called to leave a report on the pt.   The pt was seen by her yesterday for the first time in a while. The pt is supposed to be seen weekly per Dr.Crawford but they pt has been hard to reach.  Current weight as of 6.28.22: 242.6 Weight on 6.10.22: 225.6 Weight on 6.6.22: 221  4+ edema in both legs   Pt did not have his inhaler or protonix; both are available for pick up pt and pt's son both know.  Pt needs a new glucometer & strips to check BS regularly.  Also requesting an order for social work evaluation due to the home being infested to with roaches, bad.   Office number: [phone] "give orders for [name]"

## 2021-02-24 NOTE — Telephone Encounter (Signed)
See below

## 2021-02-24 NOTE — Telephone Encounter (Signed)
LVM with Dr. Nathanial Millman recommendation. Office number was provided.

## 2021-02-24 NOTE — Telephone Encounter (Signed)
Okay for social work evaluation. Advise to pick up protonix as this is very important to prevent recurrent bleeding.

## 2021-02-25 ENCOUNTER — Other Ambulatory Visit: Payer: Self-pay

## 2021-02-25 ENCOUNTER — Telehealth: Payer: Self-pay

## 2021-02-25 ENCOUNTER — Other Ambulatory Visit (INDEPENDENT_AMBULATORY_CARE_PROVIDER_SITE_OTHER): Payer: Medicare HMO

## 2021-02-25 DIAGNOSIS — D500 Iron deficiency anemia secondary to blood loss (chronic): Secondary | ICD-10-CM

## 2021-02-25 LAB — CBC WITH DIFFERENTIAL/PLATELET
Basophils Absolute: 0 10*3/uL (ref 0.0–0.1)
Basophils Relative: 1.8 % (ref 0.0–3.0)
Eosinophils Absolute: 0.1 10*3/uL (ref 0.0–0.7)
Eosinophils Relative: 3.5 % (ref 0.0–5.0)
HCT: 19.4 % — CL (ref 39.0–52.0)
Hemoglobin: 6 g/dL — CL (ref 13.0–17.0)
Lymphocytes Relative: 17.4 % (ref 12.0–46.0)
Lymphs Abs: 0.5 10*3/uL — ABNORMAL LOW (ref 0.7–4.0)
MCHC: 31.1 g/dL (ref 30.0–36.0)
MCV: 72.9 fl — ABNORMAL LOW (ref 78.0–100.0)
Monocytes Absolute: 0.4 10*3/uL (ref 0.1–1.0)
Monocytes Relative: 14.9 % — ABNORMAL HIGH (ref 3.0–12.0)
Neutro Abs: 1.7 10*3/uL (ref 1.4–7.7)
Neutrophils Relative %: 62.4 % (ref 43.0–77.0)
Platelets: 143 10*3/uL — ABNORMAL LOW (ref 150.0–400.0)
RBC: 2.66 Mil/uL — ABNORMAL LOW (ref 4.22–5.81)
RDW: 21.9 % — ABNORMAL HIGH (ref 11.5–15.5)
WBC: 2.7 10*3/uL — ABNORMAL LOW (ref 4.0–10.5)

## 2021-02-25 NOTE — Telephone Encounter (Signed)
Santiago Glad from the lab calling to report patient has a Hgb of 6.0 and a Hct of 19.4

## 2021-02-26 NOTE — Telephone Encounter (Signed)
Addressed via result note and patient called this morning. In future this needs to go to a provider in office for any critical results please.

## 2021-02-27 ENCOUNTER — Inpatient Hospital Stay (HOSPITAL_COMMUNITY)
Admission: EM | Admit: 2021-02-27 | Discharge: 2021-03-01 | DRG: 378 | Disposition: A | Discharge status: Home / Self Care | Payer: Medicare HMO | Attending: Internal Medicine | Admitting: Internal Medicine

## 2021-02-27 DIAGNOSIS — E785 Hyperlipidemia, unspecified: Secondary | ICD-10-CM | POA: Diagnosis not present

## 2021-02-27 DIAGNOSIS — Z951 Presence of aortocoronary bypass graft: Secondary | ICD-10-CM | POA: Diagnosis not present

## 2021-02-27 DIAGNOSIS — K766 Portal hypertension: Secondary | ICD-10-CM | POA: Diagnosis present

## 2021-02-27 DIAGNOSIS — Z8711 Personal history of peptic ulcer disease: Secondary | ICD-10-CM

## 2021-02-27 DIAGNOSIS — D500 Iron deficiency anemia secondary to blood loss (chronic): Secondary | ICD-10-CM | POA: Diagnosis present

## 2021-02-27 DIAGNOSIS — M109 Gout, unspecified: Secondary | ICD-10-CM | POA: Diagnosis present

## 2021-02-27 DIAGNOSIS — R69 Illness, unspecified: Secondary | ICD-10-CM | POA: Diagnosis not present

## 2021-02-27 DIAGNOSIS — Z87891 Personal history of nicotine dependence: Secondary | ICD-10-CM

## 2021-02-27 DIAGNOSIS — E11649 Type 2 diabetes mellitus with hypoglycemia without coma: Secondary | ICD-10-CM | POA: Diagnosis present

## 2021-02-27 DIAGNOSIS — I2510 Atherosclerotic heart disease of native coronary artery without angina pectoris: Secondary | ICD-10-CM | POA: Diagnosis present

## 2021-02-27 DIAGNOSIS — E876 Hypokalemia: Secondary | ICD-10-CM | POA: Diagnosis present

## 2021-02-27 DIAGNOSIS — K922 Gastrointestinal hemorrhage, unspecified: Principal | ICD-10-CM | POA: Diagnosis present

## 2021-02-27 DIAGNOSIS — J45909 Unspecified asthma, uncomplicated: Secondary | ICD-10-CM | POA: Diagnosis present

## 2021-02-27 DIAGNOSIS — J4520 Mild intermittent asthma, uncomplicated: Secondary | ICD-10-CM

## 2021-02-27 DIAGNOSIS — I864 Gastric varices: Secondary | ICD-10-CM | POA: Diagnosis not present

## 2021-02-27 DIAGNOSIS — I130 Hypertensive heart and chronic kidney disease with heart failure and stage 1 through stage 4 chronic kidney disease, or unspecified chronic kidney disease: Secondary | ICD-10-CM | POA: Diagnosis present

## 2021-02-27 DIAGNOSIS — Z8249 Family history of ischemic heart disease and other diseases of the circulatory system: Secondary | ICD-10-CM

## 2021-02-27 DIAGNOSIS — I5032 Chronic diastolic (congestive) heart failure: Secondary | ICD-10-CM | POA: Diagnosis not present

## 2021-02-27 DIAGNOSIS — G4733 Obstructive sleep apnea (adult) (pediatric): Secondary | ICD-10-CM | POA: Diagnosis present

## 2021-02-27 DIAGNOSIS — N183 Chronic kidney disease, stage 3 unspecified: Secondary | ICD-10-CM | POA: Diagnosis not present

## 2021-02-27 DIAGNOSIS — Z955 Presence of coronary angioplasty implant and graft: Secondary | ICD-10-CM

## 2021-02-27 DIAGNOSIS — Z7984 Long term (current) use of oral hypoglycemic drugs: Secondary | ICD-10-CM

## 2021-02-27 DIAGNOSIS — N1831 Chronic kidney disease, stage 3a: Secondary | ICD-10-CM | POA: Diagnosis present

## 2021-02-27 DIAGNOSIS — K449 Diaphragmatic hernia without obstruction or gangrene: Secondary | ICD-10-CM | POA: Diagnosis not present

## 2021-02-27 DIAGNOSIS — E1122 Type 2 diabetes mellitus with diabetic chronic kidney disease: Secondary | ICD-10-CM | POA: Diagnosis present

## 2021-02-27 DIAGNOSIS — I5030 Unspecified diastolic (congestive) heart failure: Secondary | ICD-10-CM | POA: Diagnosis present

## 2021-02-27 DIAGNOSIS — K219 Gastro-esophageal reflux disease without esophagitis: Secondary | ICD-10-CM | POA: Diagnosis present

## 2021-02-27 DIAGNOSIS — C220 Liver cell carcinoma: Secondary | ICD-10-CM | POA: Diagnosis not present

## 2021-02-27 DIAGNOSIS — K7460 Unspecified cirrhosis of liver: Secondary | ICD-10-CM | POA: Diagnosis not present

## 2021-02-27 DIAGNOSIS — D649 Anemia, unspecified: Secondary | ICD-10-CM

## 2021-02-27 DIAGNOSIS — K3189 Other diseases of stomach and duodenum: Secondary | ICD-10-CM | POA: Diagnosis not present

## 2021-02-27 DIAGNOSIS — Z20822 Contact with and (suspected) exposure to covid-19: Secondary | ICD-10-CM | POA: Diagnosis not present

## 2021-02-27 DIAGNOSIS — E118 Type 2 diabetes mellitus with unspecified complications: Secondary | ICD-10-CM | POA: Diagnosis not present

## 2021-02-27 DIAGNOSIS — K7030 Alcoholic cirrhosis of liver without ascites: Secondary | ICD-10-CM | POA: Diagnosis not present

## 2021-02-27 DIAGNOSIS — Z8505 Personal history of malignant neoplasm of liver: Secondary | ICD-10-CM

## 2021-02-27 DIAGNOSIS — Z888 Allergy status to other drugs, medicaments and biological substances status: Secondary | ICD-10-CM

## 2021-02-27 DIAGNOSIS — K2950 Unspecified chronic gastritis without bleeding: Secondary | ICD-10-CM | POA: Diagnosis present

## 2021-02-27 DIAGNOSIS — Z7951 Long term (current) use of inhaled steroids: Secondary | ICD-10-CM

## 2021-02-27 DIAGNOSIS — K921 Melena: Secondary | ICD-10-CM | POA: Diagnosis not present

## 2021-02-27 DIAGNOSIS — I8510 Secondary esophageal varices without bleeding: Secondary | ICD-10-CM | POA: Diagnosis present

## 2021-02-27 DIAGNOSIS — R195 Other fecal abnormalities: Secondary | ICD-10-CM | POA: Diagnosis not present

## 2021-02-27 DIAGNOSIS — Z79899 Other long term (current) drug therapy: Secondary | ICD-10-CM

## 2021-02-27 DIAGNOSIS — D62 Acute posthemorrhagic anemia: Secondary | ICD-10-CM | POA: Diagnosis not present

## 2021-02-27 DIAGNOSIS — Z833 Family history of diabetes mellitus: Secondary | ICD-10-CM

## 2021-02-27 LAB — PROTIME-INR
INR: 1.2 (ref 0.8–1.2)
Prothrombin Time: 15.5 seconds — ABNORMAL HIGH (ref 11.4–15.2)

## 2021-02-27 LAB — COMPREHENSIVE METABOLIC PANEL
ALT: 26 U/L (ref 0–44)
AST: 35 U/L (ref 15–41)
Albumin: 2.8 g/dL — ABNORMAL LOW (ref 3.5–5.0)
Alkaline Phosphatase: 197 U/L — ABNORMAL HIGH (ref 38–126)
Anion gap: 9 (ref 5–15)
BUN: 13 mg/dL (ref 8–23)
CO2: 20 mmol/L — ABNORMAL LOW (ref 22–32)
Calcium: 8 mg/dL — ABNORMAL LOW (ref 8.9–10.3)
Chloride: 107 mmol/L (ref 98–111)
Creatinine, Ser: 0.9 mg/dL (ref 0.61–1.24)
GFR, Estimated: >60 mL/min (ref >60)
Glucose, Bld: 77 mg/dL (ref 70–99)
Potassium: 3.3 mmol/L — ABNORMAL LOW (ref 3.5–5.1)
Sodium: 136 mmol/L (ref 135–145)
Total Bilirubin: 1.1 mg/dL (ref 0.3–1.2)
Total Protein: 6.1 g/dL — ABNORMAL LOW (ref 6.5–8.1)

## 2021-02-27 LAB — RESP PANEL BY RT-PCR (FLU A&B, COVID) ARPGX2
Influenza A by PCR: NEGATIVE
Influenza B by PCR: NEGATIVE
SARS Coronavirus 2 by RT PCR: NEGATIVE

## 2021-02-27 LAB — CBC
HCT: 21 % — ABNORMAL LOW (ref 39.0–52.0)
Hemoglobin: 5.9 g/dL — CL (ref 13.0–17.0)
MCH: 22 pg — ABNORMAL LOW (ref 26.0–34.0)
MCHC: 28.1 g/dL — ABNORMAL LOW (ref 30.0–36.0)
MCV: 78.4 fL — ABNORMAL LOW (ref 80.0–100.0)
Platelets: 159 10*3/uL (ref 150–400)
RBC: 2.68 MIL/uL — ABNORMAL LOW (ref 4.22–5.81)
RDW: 20.2 % — ABNORMAL HIGH (ref 11.5–15.5)
WBC: 2.9 10*3/uL — ABNORMAL LOW (ref 4.0–10.5)
nRBC: 0 % (ref 0.0–0.2)

## 2021-02-27 LAB — POC OCCULT BLOOD, ED: Fecal Occult Bld: POSITIVE — AB

## 2021-02-27 LAB — PREPARE RBC (CROSSMATCH)

## 2021-02-27 MED ORDER — SODIUM CHLORIDE 0.9 % IV SOLN
10.0000 mL/h | Freq: Once | INTRAVENOUS | Status: AC
  Administered 2021-02-27: 10 mL/h via INTRAVENOUS

## 2021-02-27 MED ORDER — UMECLIDINIUM-VILANTEROL 62.5-25 MCG/INH IN AEPB
1.0000 | INHALATION_SPRAY | Freq: Every day | RESPIRATORY_TRACT | Status: DC
  Administered 2021-02-28: 1 via RESPIRATORY_TRACT
  Filled 2021-02-27: qty 14

## 2021-02-27 MED ORDER — LACTULOSE 10 GM/15ML PO SOLN
20.0000 g | Freq: Three times a day (TID) | ORAL | Status: DC
  Administered 2021-02-27 – 2021-03-01 (×4): 20 g via ORAL
  Filled 2021-02-27 (×4): qty 30

## 2021-02-27 MED ORDER — PANTOPRAZOLE 80MG IVPB - SIMPLE MED
80.0000 mg | Freq: Once | INTRAVENOUS | Status: AC
  Administered 2021-02-27: 80 mg via INTRAVENOUS
  Filled 2021-02-27: qty 100

## 2021-02-27 MED ORDER — FUROSEMIDE 40 MG PO TABS: 40.0000 mg | ORAL_TABLET | Freq: Every day | ORAL | Status: DC

## 2021-02-27 MED ORDER — FUROSEMIDE 10 MG/ML IJ SOLN
40.0000 mg | Freq: Once | INTRAMUSCULAR | Status: AC
  Administered 2021-02-28: 40 mg via INTRAVENOUS
  Filled 2021-02-27: qty 4

## 2021-02-27 MED ORDER — ALBUTEROL SULFATE HFA 108 (90 BASE) MCG/ACT IN AERS
2.0000 | INHALATION_SPRAY | Freq: Four times a day (QID) | RESPIRATORY_TRACT | Status: DC | PRN
  Administered 2021-02-27: 2 via RESPIRATORY_TRACT
  Filled 2021-02-27: qty 6.7

## 2021-02-27 NOTE — ED Provider Notes (Signed)
[facility] DEPT Provider Note   CSN: [id number] Arrival date & time: 02/27/21  1735     History Chief Complaint  Patient presents with   low hemoglobin    DEONTRE ALLSUP is a 75 y.o. male.  75 year old male with prior medical history as detailed below presents for evaluation.  Patient reports that he had lab work performed on Thursday.  Patient was called yesterday and was told that his hemoglobin was down to 6.  Patient was told to come to the ED.  Patient reports feeling weak and tired.  He reports prior history of transfusion.  Patient denies recent bleeding.  He denies hematemesis.  He denies melena.  He denies fever.  The history is provided by the patient and medical records.  Illness Location:  Anemia Severity:  Moderate Onset quality:  Gradual Duration:  3 days Timing:  Constant Progression:  Waxing and waning Chronicity:  Recurrent     Past Medical History:  Diagnosis Date   Arthritis    "joints tighten up sometimes" (03/27/2104)   Carcinoma of liver, hepatocellular ([place]) 06/30/2020   CHF (congestive heart failure) (HCC)    Chronic lower back pain    Coronary artery disease    a. 05/2012 Cath/PCI: LM 40ost, 50-60d, LAD 99p ruptured plaque (3.0x28 DES), LCX 50p/m, RCA 30-40p, 67m EF 65-70%.   Coronary artery disease involving native coronary artery of native heart without angina pectoris    Severe left main disease at catheterization July 2015  CABG x3 with a LIMA to the LAD, SVG to the OM, SVG to the PDA on 03/31/14. EF 60% by cath.    Diastolic heart failure (HCC)    GERD without esophagitis 08/04/2010   Hepatic cirrhosis ([place]    a. Dx 01/2014 - CT a/p    History of blood transfusion    "related to bleeding ulcers"   History of concussion    1976--  NO RESIDUAL   History of GI bleed    a. UGIB 07/2012;  b. 01/2014 admission with GIB/FOB stool req 1U prbc's->EGD showed portal gastropathy, barrett's esoph, and chronic active h. pylori  gastritis.   History of gout    2007 &  2008  LEFT LEG-- NO ISSUE SINCE   Hyperlipidemia    Iron deficiency anemia    Kidney stones    OA (osteoarthritis of spine)    LOWER BACK--  INTERMITTANT LEFT LEG NUMBNESS   OSA (obstructive sleep apnea)    PULMOLOGIST-  DR CLANCE--  MODERATE OSA  STARTED CPAP 2012--  BUT CURRENTLY HAS NOT USED PAST 6 MONTHS   Phimosis    a. s/p circumcision 2015.   Type 2 diabetes mellitus ([place]    Unspecified essential hypertension     Patient Active Problem List   Diagnosis Date Noted   Acute GI bleeding 02/27/2021   CKD stage 3 due to type 2 diabetes mellitus ([facility] 02/27/2021   Hepatic encephalopathy ([facility] 01/22/2021   GI bleeding 09/10/2020   CKD (chronic kidney disease), stage II    Hepatocellular carcinoma ([place] 08/09/2020   Routine general medical examination at a health care facility 05/06/2020   Iron deficiency anemia    Symptomatic anemia 04/15/2020   Pulmonary hypertension, unspecified ([facility] 03/11/2020   Asthma, mild intermittent 013/24/4010  Alcoholic cirrhosis of liver with ascites ([place] 12/27/2019   (HFpEF) heart failure with preserved ejection fraction ([facility] 12/24/2018   CKD (chronic kidney disease), stage III ([place] 12/18/2017   Peptic ulcer disease  12/18/2017   GI bleed 09/29/2017   Morbid obesity ([place]) 02/05/2016   S/P CABG x 3 03/31/2014   Hepatic cirrhosis ([place]) 02/27/2014   Melena 08/14/2012   Hyperlipidemia with target LDL less than 70 06/29/2012   Thrombocytopenia ([place]) 06/29/2012   Coronary artery disease involving native coronary artery of native heart without angina pectoris    OSA (obstructive sleep apnea)    Type 2 diabetes with complication ([place]) 32/44/0102   GERD without esophagitis 08/04/2010   History of gout    Hypertensive heart disease     Past Surgical History:  Procedure Laterality Date   ANKLE FRACTURE SURGERY Right 1989   "plate put in"   APPENDECTOMY  05-16-2004   open   BIOPSY  01/08/2021    Procedure: BIOPSY;  Surgeon: Wilford Corner, MD;  Location: [facility];  Service: Endoscopy;;   CIRCUMCISION N/A 09/09/2013   Procedure: CIRCUMCISION ADULT;  Surgeon: Bernestine Amass, MD;  Location[facility];  Service: Urology;  Laterality: N/A;   COLECTOMY  05-16-2004   COLONOSCOPY WITH PROPOFOL N/A 11/20/2020   Procedure: COLONOSCOPY WITH PROPOFOL;  Surgeon: Ronald Lobo, MD;  Location: [facility];  Service: Endoscopy;  Laterality: N/A;   CORONARY ANGIOPLASTY WITH STENT PLACEMENT  06/28/2012  DR COOPER   PCI W/  X1 DES to [place]. LAD/  LM  40% OSTIAL & 50-60% DISTAL /  50% PROX LCX/  30-40% PROX RCA & 50% MID RCA/   LVEF 65-70%   CORONARY ARTERY BYPASS GRAFT N/A 03/31/2014   Procedure: CORONARY ARTERY BYPASS GRAFTING (CABG) times 3 using left internal mammary artery and right saphenous vein.;  Surgeon: Melrose Nakayama, MD;  Location: [facility];  Service: Open Heart Surgery;  Laterality: N/A;   DOBUTAMINE STRESS ECHO  06-08-2012   MODERATE HYPOKINESIS/ ISCHEMIA MID INFERIOR WALL   ESOPHAGOGASTRODUODENOSCOPY  08/15/2012   Procedure: ESOPHAGOGASTRODUODENOSCOPY (EGD);  Surgeon: Wonda Horner, MD;  Location[facility] ENDOSCOPY;  Service: Endoscopy;  Laterality: N/A;   ESOPHAGOGASTRODUODENOSCOPY N/A 02/17/2014   Procedure: ESOPHAGOGASTRODUODENOSCOPY (EGD);  Surgeon: Jeryl Columbia, MD;  Location[facility] ENDOSCOPY;  Service: Endoscopy;  Laterality: N/A;   ESOPHAGOGASTRODUODENOSCOPY N/A 04/17/2020   Procedure: ESOPHAGOGASTRODUODENOSCOPY (EGD);  Surgeon: Ronnette Juniper, MD;  Location: [facility];  Service: Gastroenterology;  Laterality: N/A;   ESOPHAGOGASTRODUODENOSCOPY N/A 09/11/2020   Procedure: ESOPHAGOGASTRODUODENOSCOPY (EGD);  Surgeon: Clarene Essex, MD;  Location[facility];  Service: Endoscopy;  Laterality: N/A;   ESOPHAGOGASTRODUODENOSCOPY  10/09/2020   ESOPHAGOGASTRODUODENOSCOPY N/A 10/09/2020   Procedure: ESOPHAGOGASTRODUODENOSCOPY (EGD);  Surgeon: Otis Brace, MD;  Location: [facility]  ENDOSCOPY;  Service: Gastroenterology;  Laterality: N/A;   ESOPHAGOGASTRODUODENOSCOPY N/A 01/08/2021   Procedure: ESOPHAGOGASTRODUODENOSCOPY (EGD);  Surgeon: Wilford Corner, MD;  Location: [facility];  Service: Endoscopy;  Laterality: N/A;   ESOPHAGOGASTRODUODENOSCOPY (EGD) WITH PROPOFOL N/A 09/30/2017   Procedure: ESOPHAGOGASTRODUODENOSCOPY (EGD) WITH PROPOFOL;  Surgeon: Wonda Horner, MD;  Location: [facility] ENDOSCOPY;  Service: Endoscopy;  Laterality: N/A;   ESOPHAGOGASTRODUODENOSCOPY (EGD) WITH PROPOFOL N/A 12/20/2017   Procedure: ESOPHAGOGASTRODUODENOSCOPY (EGD) WITH PROPOFOL;  Surgeon: Clarene Essex, MD;  Location: [facility];  Service: Endoscopy;  Laterality: N/A;   ESOPHAGOGASTRODUODENOSCOPY (EGD) WITH PROPOFOL N/A 08/10/2020   Procedure: ESOPHAGOGASTRODUODENOSCOPY (EGD) WITH PROPOFOL;  Surgeon: Wilford Corner, MD;  Location: [facility];  Service: Endoscopy;  Laterality: N/A;   ESOPHAGOGASTRODUODENOSCOPY (EGD) WITH PROPOFOL N/A 11/20/2020   Procedure: ESOPHAGOGASTRODUODENOSCOPY (EGD) WITH PROPOFOL;  Surgeon: Ronald Lobo, MD;  [facility];  Service: Endoscopy;  Laterality: N/A;   GIVENS CAPSULE STUDY N/A 11/20/2020  Procedure: GIVENS CAPSULE STUDY;  Surgeon: Ronald Lobo, MD;  [facility];  Service: Endoscopy;  Laterality: N/A;   HERNIA REPAIR     INTRAOPERATIVE TRANSESOPHAGEAL ECHOCARDIOGRAM N/A 03/31/2014   Procedure: INTRAOPERATIVE TRANSESOPHAGEAL ECHOCARDIOGRAM;  Surgeon: Melrose Nakayama, MD;  Location: [facility];  Service: Open Heart Surgery;  Laterality: N/A;   IR ANGIOGRAM SELECTIVE EACH ADDITIONAL VESSEL  06/16/2020   IR ANGIOGRAM SELECTIVE EACH ADDITIONAL VESSEL  06/16/2020   IR ANGIOGRAM SELECTIVE EACH ADDITIONAL VESSEL  06/16/2020   IR ANGIOGRAM SELECTIVE EACH ADDITIONAL VESSEL  06/16/2020   IR ANGIOGRAM SELECTIVE EACH ADDITIONAL VESSEL  06/16/2020   IR ANGIOGRAM SELECTIVE EACH ADDITIONAL VESSEL  06/16/2020   IR ANGIOGRAM SELECTIVE EACH ADDITIONAL  VESSEL  06/16/2020   IR ANGIOGRAM SELECTIVE EACH ADDITIONAL VESSEL  06/16/2020   IR ANGIOGRAM SELECTIVE EACH ADDITIONAL VESSEL  06/16/2020   IR ANGIOGRAM SELECTIVE EACH ADDITIONAL VESSEL  06/30/2020   IR ANGIOGRAM SELECTIVE EACH ADDITIONAL VESSEL  06/30/2020   IR ANGIOGRAM VISCERAL SELECTIVE  06/16/2020   IR ANGIOGRAM VISCERAL SELECTIVE  06/30/2020   IR EMBO ARTERIAL NOT HEMORR HEMANG INC GUIDE ROADMAPPING  06/16/2020   IR EMBO TUMOR ORGAN ISCHEMIA INFARCT INC GUIDE ROADMAPPING  06/30/2020   IR EMBO VENOUS NOT HEMORR HEMANG  INC GUIDE ROADMAPPING  01/13/2021   IR IVUS EACH ADDITIONAL NON CORONARY VESSEL  01/13/2021   IR RADIOLOGIST EVAL & MGMT  05/06/2020   IR RADIOLOGIST EVAL & MGMT  05/20/2020   IR TIPS  01/13/2021   IR US GUIDE VASC ACCESS RIGHT  06/16/2020   IR US GUIDE VASC ACCESS RIGHT  06/30/2020   IR US GUIDE VASC ACCESS RIGHT  01/13/2021   LAPAROSCOPIC UMBILICAL HERNIA REPAIR W/ MESH  06-06-2011   LEFT HEART CATHETERIZATION WITH CORONARY ANGIOGRAM N/A 03/28/2014   Procedure: LEFT HEART CATHETERIZATION WITH CORONARY ANGIOGRAM;  Surgeon: Sinclair Grooms, MD;  Location[facility] C[facility] LAB;  Service: Cardiovascular;  Laterality: N/A;   LIPOMA EXCISION Left 08/25/2017   Procedure: EXCISION LIPOMA LEFT POSTERIOR THIGH;  Surgeon: Clovis Riley, MD;  Location: [facility];  Service: General;  Laterality: Left;   NEPHROLITHOTOMY  1990'S   OPEN APPENDECTOMY W/ PARTIAL CECECTOMY  05-16-2004   PERCUTANEOUS CORONARY STENT INTERVENTION (PCI-S) N/A 06/28/2012   Procedure: PERCUTANEOUS CORONARY STENT INTERVENTION (PCI-S);  Surgeon: Sherren Mocha, MD;  Location: [facility] CATH LAB;  Service: Cardiovascular;  Laterality: N/A;   POLYPECTOMY  11/20/2020   Procedure: POLYPECTOMY;  Surgeon: Ronald Lobo, MD;  [facility];  Service: Endoscopy;;   RADIOLOGY WITH ANESTHESIA N/A 01/13/2021   Procedure: IR WITH ANESTHESIA - TIPS;  Surgeon: Suzette Battiest, MD;  Location: [facility];  Service: Radiology;  Laterality: N/A;        Family History  Problem Relation Age of Onset   Lung cancer Sister    Cancer Sister        lung   Cancer Mother    Cancer Father        died in his 50s.   Cancer Brother        lung   Coronary artery disease Other    Diabetes Other    Colon cancer Other    Cancer Sister        lung    Social History   Tobacco Use   Smoking status: Former    Packs/day: 2.00    Years: 40.00    Pack years: 80.00    Types: Cigarettes    Quit date: 08/29/1996  Years since quitting: 24.5   Smokeless tobacco: Never  Vaping Use   Vaping Use: Never used  Substance Use Topics   Alcohol use: Not Currently    Alcohol/week: 8.0 standard drinks    Types: 8 Cans of beer per week    Comment: 2 beers every other day   Drug use: No    Home Medications Prior to Admission medications   Medication Sig Start Date End Date Taking? Authorizing Provider  albuterol (VENTOLIN HFA) 108 (90 Base) MCG/ACT inhaler Inhale 2 puffs into the lungs every 6 (six) hours as needed for wheezing or shortness of breath. 03/12/20  Yes [name], MD  furosemide (LASIX) 40 MG tablet Take 1 tablet (40 mg total) by mouth daily. 01/31/21  Yes Elgergawy, Silver Huguenin, MD  glimepiride (AMARYL) 2 MG tablet TAKE 1 TABLET(2 MG) BY MOUTH DAILY BEFORE BREAKFAST Patient taking differently: Take 2 mg by mouth daily with breakfast. 12/16/20  Yes [name], MD  lactulose (CHRONULAC) 10 GM/15ML solution Take 30 mLs (20 g total) by mouth 3 (three) times daily. 01/30/21  Yes Elgergawy, Silver Huguenin, MD  metFORMIN (GLUCOPHAGE) 850 MG tablet TAKE 1 TABLET(850 MG) BY MOUTH TWICE DAILY WITH A MEAL Patient taking differently: Take 850 mg by mouth 2 (two) times daily with a meal. 01/08/21  Yes [name], MD  umeclidinium-vilanterol (ANORO ELLIPTA) 62.5-25 MCG/INH AEPB Inhale 1 puff into the lungs daily. 03/12/20  Yes [name], MD  Accu-Chek Softclix Lancets lancets USE AS DIRECTED TO TEST BLOOD SUGAR FOUR  TIMES DAILY Patient not taking: No sig reported 03/13/20   [name], MD  blood glucose meter kit and supplies Dispense based on patient and insurance preference. Use up to four times daily as directed. (FOR ICD-10 E10.9, E11.9). Patient not taking: No sig reported 03/12/20   [name], MD  hydrocortisone (ANUSOL-HC) 2.5 % rectal cream Place 1 application rectally 2 (two) times daily. Patient not taking: No sig reported 12/04/20   [name], MD  nitroGLYCERIN (NITROSTAT) 0.4 MG SL tablet Place 1 tablet (0.4 mg total) under the tongue every 5 (five) minutes as needed for chest pain. 06/08/16   Nahser, Wonda Cheng, MD  pantoprazole (PROTONIX) 40 MG tablet Take 1 tablet (40 mg total) by mouth 2 (two) times daily. 02/23/21   [name], MD    Allergies    Fluzone quadrivalent [influenza vac split quad]  Review of Systems   Review of Systems  All other systems reviewed and are negative.  Physical Exam Updated Vital Signs BP 134/64   Pulse 67   Temp 98.7 F (37.1 C) (Oral)   Resp 20   Ht '5\' 8"'  (1.727 m)   Wt 111.1 kg   SpO2 100%   BMI 37.25 kg/m   Physical Exam Vitals and nursing note reviewed.  Constitutional:      General: He is not in acute distress.    Appearance: Normal appearance. He is well-developed.  HENT:     Head: Normocephalic and atraumatic.  Eyes:     Conjunctiva/sclera: Conjunctivae normal.     Pupils: Pupils are equal, round, and reactive to light.  Cardiovascular:     Rate and Rhythm: Normal rate and regular rhythm.     Heart sounds: Normal heart sounds.  Pulmonary:     Effort: Pulmonary effort is normal. No respiratory distress.     Breath sounds: Normal breath sounds.  Abdominal:     General: There is  no distension.     Palpations: Abdomen is soft.     Tenderness: There is no abdominal tenderness.  Genitourinary:    Comments: Scant BRB present on exam Musculoskeletal:        General: No deformity. Normal range of  motion.     Cervical back: Normal range of motion and neck supple.  Skin:    General: Skin is warm and dry.  Neurological:     General: No focal deficit present.     Mental Status: He is alert and oriented to person, place, and time.    ED Results / Procedures / Treatments   Labs (all labs ordered are listed, but only abnormal results are displayed) Labs Reviewed  COMPREHENSIVE METABOLIC PANEL - Abnormal; Notable for the following components:      Result Value   Potassium 3.3 (*)    CO2 20 (*)    Calcium 8.0 (*)    Total Protein 6.1 (*)    Albumin 2.8 (*)    Alkaline Phosphatase 197 (*)    All other components within normal limits  CBC - Abnormal; Notable for the following components:   WBC 2.9 (*)    RBC 2.68 (*)    Hemoglobin 5.9 (*)    HCT 21.0 (*)    MCV 78.4 (*)    MCH 22.0 (*)    MCHC 28.1 (*)    RDW 20.2 (*)    All other components within normal limits  PROTIME-INR - Abnormal; Notable for the following components:   Prothrombin Time 15.5 (*)    All other components within normal limits  POC OCCULT BLOOD, ED - Abnormal; Notable for the following components:   Fecal Occult Bld POSITIVE (*)    All other components within normal limits  RESP PANEL BY RT-PCR (FLU A&B, COVID) ARPGX2  CBC  TYPE AND SCREEN  PREPARE RBC (CROSSMATCH)    EKG None  Radiology No results found.  Procedures Procedures   Medications Ordered in ED Medications  0.9 %  sodium chloride infusion (has no administration in time range)  furosemide (LASIX) injection 40 mg (has no administration in time range)  furosemide (LASIX) tablet 40 mg (has no administration in time range)  lactulose (CHRONULAC) 10 GM/15ML solution 20 g (has no administration in time range)  albuterol (VENTOLIN HFA) 108 (90 Base) MCG/ACT inhaler 2 puff (has no administration in time range)  umeclidinium-vilanterol (ANORO ELLIPTA) 62.5-25 MCG/INH 1 puff (has no administration in time range)  pantoprazole (PROTONIX) 80  mg /NS 100 mL IVPB (80 mg Intravenous New Bag/Given 02/27/21 2017)    ED Course  I have reviewed the triage vital signs and the nursing notes.  Pertinent labs & imaging results that were available during my care of the patient were reviewed by me and considered in my medical decision making (see chart for details).    MDM Rules/Calculators/A&P                          MDM  MSE complete  LEEMAN JOHNSEY was evaluated in Emergency Department on 02/27/2021 for the symptoms described in the history of present illness. He was evaluated in the context of the global COVID-19 pandemic, which necessitated consideration that the patient might be at risk for infection with the SARS-CoV-2 virus that causes COVID-19. Institutional protocols and algorithms that pertain to the evaluation of patients at risk for COVID-19 are in a state of rapid change based on information released  by regulatory bodies including the CDC and federal and state organizations. These policies and algorithms were followed during the patient's care in the ED.  Patient is presenting with complaint of anemia.  Patient reports worsening fatigue.  Hemoglobin today is 5.9.  Patient does have scant bright red blood present on rectal exam.  Patient would benefit from admission.  Hospitalist service is aware of case and will evaluate for same.  Final Clinical Impression(s) / ED Diagnoses Final diagnoses:  Anemia, unspecified type    Rx / DC Orders ED Discharge Orders     None        Valarie Merino, MD 02/27/21 2108

## 2021-02-27 NOTE — ED Triage Notes (Signed)
Patient reports his pcp told him Thursday his hgb was low and to come to ED for transfusion. Patient says he is in no pain , was unable to come to ed until today

## 2021-02-27 NOTE — H&P (Addendum)
History and Physical    [name] GHW:[id number] DOB: 13-May-1946 DOA: 02/27/2021  PCP: [name], MD  Patient coming from: Home   I have personally briefly reviewed patient's old medical records in [place]  Chief Complaint: low hemoglobin  HPI: [name] is a 75 y.o. male with medical history significant for  past medical history significant for hepatocellular carcinoma s/p treatment 07/18/2020, hepatic cirrhosis with portal HTN and esophageal varices, CABG, CKD stage IIIa, PUD, GI bleed, gout, HLD, IDA, OSA not on CPAP, type 2 diabetes mellitus, essential hypertension, HFpEF (EF of 60-65% with grade 2 diastolic [id number]) , osteoarthritis, chronic low back pain who presents for low hemoglobin at the recommendation of PCP.   Patient had follow up with PCP for recent hospitalization and had lab work that returned 2 days ago with hemoglobin of 6. Pt unclear on reason why he did not come to ED until now but told ED physician he was fishing. Thinks he had an episode bright red blood with bowel movement 2 days ago but otherwise no dark stool. No nausea or vomiting. No abdominal pain. Thinks his LE edema is about the same. Last alcoholic drink a month ago.  Patient has had numerous hospitalization recently for recurrent acute blood loss anemia.  Last admitted on 5/27-6/4 for acute hepatic encephalopathy secondary to noncompliance with lactulose. He did require 1u pRBC transfusion on 6/1.   Prior to that he had 11-day hospitalization (5/11-5/22/22) due to symptomatic anemia in the setting of recurrent GI bleeding requiring 5 unit PRBC and TIPS procedure with embolization of gastric varices by IR on 01/13/2021.  Also admitted 11/18/2020-11/23/2020 for symptomatic anemia.  GI work-up with EGD, colonoscopy, and capsule endoscopy did not find source of acute bleeding.  He required 6 units PRBC transfusions total during the hospitalization.  Hospitalization was  complicated by volume overload requiring diuresis.  ED Course: He was afebrile and normotensive.  Repeat hemoglobin of 5.9 with the usual baseline around 7.  Leukopenia of 2.9.  Normal platelet 159.  K of 3.3.  BG of 77.  Review of Systems:  Constitutional: No Weight Change, No Fever ENT/Mouth: No sore throat, No Rhinorrhea Eyes: No Eye Pain, No Vision Changes Cardiovascular: No Chest Pain, no SOB, + Edema, No Palpitations Respiratory: No Cough, No Sputum Gastrointestinal: No Nausea, No Vomiting, No Diarrhea, No Constipation, No Pain Genitourinary: no Urinary Incontinence, No Urgency, No Flank Pain Musculoskeletal: No Arthralgias, No Myalgias Skin: No Skin Lesions, No Pruritus, Neuro: no Weakness, No Numbness, Psych: No Anxiety/Panic, No Depression, no decrease appetite Heme/Lymph: No Bruising, No Bleeding  Past Medical History:  Diagnosis Date   Arthritis    "joints tighten up sometimes" (03/27/2104)   Carcinoma of liver, hepatocellular ([place]) 06/30/2020   CHF (congestive heart failure) (HCC)    Chronic lower back pain    Coronary artery disease    a. 05/2012 Cath/PCI: LM 40ost, 50-60d, LAD 99p ruptured plaque (3.0x28 DES), LCX 50p/m, RCA 30-40p, 71m EF 65-70%.   Coronary artery disease involving native coronary artery of native heart without angina pectoris    Severe left main disease at catheterization July 2015  CABG x3 with a LIMA to the LAD, SVG to the OM, SVG to the PDA on 03/31/14. EF 60% by cath.    Diastolic heart failure (HCC)    GERD without esophagitis 08/04/2010   Hepatic cirrhosis ([facility]ope    a. Dx 01/2014 - CT a/p    History of blood transfusion    "  related to bleeding ulcers"   History of concussion    1976--  NO RESIDUAL   History of GI bleed    a. UGIB 07/2012;  b. 01/2014 admission with GIB/FOB stool req 1U prbc's->EGD showed portal gastropathy, barrett's esoph, and chronic active h. pylori gastritis.   History of gout    2007 &  2008  LEFT LEG-- NO ISSUE SINCE    Hyperlipidemia    Iron deficiency anemia    Kidney stones    OA (osteoarthritis of spine)    LOWER BACK--  INTERMITTANT LEFT LEG NUMBNESS   OSA (obstructive sleep apnea)    PULMOLOGIST-  DR CLANCE--  MODERATE OSA  STARTED CPAP 2012--  BUT CURRENTLY HAS NOT USED PAST 6 MONTHS   Phimosis    a. s/p circumcision 2015.   Type 2 diabetes mellitus ([place])    Unspecified essential hypertension     Past Surgical History:  Procedure Laterality Date   ANKLE FRACTURE SURGERY Right 1989   "plate put in"   APPENDECTOMY  05-16-2004   open   BIOPSY  01/08/2021   Procedure: BIOPSY;  Surgeon: Wilford Corner, MD;  Location: [facility];  Service: Endoscopy;;   CIRCUMCISION N/A 09/09/2013   Procedure: CIRCUMCISION ADULT;  Surgeon: Bernestine Amass, MD;  Location: [facility];  Service: Urology;  Laterality: N/A;   COLECTOMY  05-16-2004   COLONOSCOPY WITH PROPOFOL N/A 11/20/2020   Procedure: COLONOSCOPY WITH PROPOFOL;  Surgeon: Ronald Lobo, MD;  Location: [facility];  Service: Endoscopy;  Laterality: N/A;   CORONARY ANGIOPLASTY WITH STENT PLACEMENT  06/28/2012  DR COOPER   PCI W/  X1 DES to [place]. LAD/  LM  40% OSTIAL & 50-60% DISTAL /  50% PROX LCX/  30-40% PROX RCA & 50% MID RCA/   LVEF 65-70%   CORONARY ARTERY BYPASS GRAFT N/A 03/31/2014   Procedure: CORONARY ARTERY BYPASS GRAFTING (CABG) times 3 using left internal mammary artery and right saphenous vein.;  Surgeon: Melrose Nakayama, MD;  Location: [place];  Service: Open Heart Surgery;  Laterality: N/A;   DOBUTAMINE STRESS ECHO  06-08-2012   MODERATE HYPOKINESIS/ ISCHEMIA MID INFERIOR WALL   ESOPHAGOGASTRODUODENOSCOPY  08/15/2012   Procedure: ESOPHAGOGASTRODUODENOSCOPY (EGD);  Surgeon: Wonda Horner, MD;  Location: [facility] ENDOSCOPY;  Service: Endoscopy;  Laterality: N/A;   ESOPHAGOGASTRODUODENOSCOPY N/A 02/17/2014   Procedure: ESOPHAGOGASTRODUODENOSCOPY (EGD);  Surgeon: Jeryl Columbia, MD;  Location: [facility] ENDOSCOPY;  Service: Endoscopy;   Laterality: N/A;   ESOPHAGOGASTRODUODENOSCOPY N/A 04/17/2020   Procedure: ESOPHAGOGASTRODUODENOSCOPY (EGD);  Surgeon: Ronnette Juniper, MD;  Location: [place];  Service: Gastroenterology;  Laterality: N/A;   ESOPHAGOGASTRODUODENOSCOPY N/A 09/11/2020   Procedure: ESOPHAGOGASTRODUODENOSCOPY (EGD);  Surgeon: Clarene Essex, MD;  Location: [facility];  Service: Endoscopy;  Laterality: N/A;   ESOPHAGOGASTRODUODENOSCOPY  10/09/2020   ESOPHAGOGASTRODUODENOSCOPY N/A 10/09/2020   Procedure: ESOPHAGOGASTRODUODENOSCOPY (EGD);  Surgeon: Otis Brace, MD;  [facility];  Service: Gastroenterology;  Laterality: N/A;   ESOPHAGOGASTRODUODENOSCOPY N/A 01/08/2021   Procedure: ESOPHAGOGASTRODUODENOSCOPY (EGD);  Surgeon: Wilford Corner, MD;  Location: [facility];  Service: Endoscopy;  Laterality: N/A;   ESOPHAGOGASTRODUODENOSCOPY (EGD) WITH PROPOFOL N/A 09/30/2017   Procedure: ESOPHAGOGASTRODUODENOSCOPY (EGD) WITH PROPOFOL;  Surgeon: Wonda Horner, MD;  Location: [facility]COPY;  Service: Endoscopy;  Laterality: N/A;   ESOPHAGOGASTRODUODENOSCOPY (EGD) WITH PROPOFOL N/A 12/20/2017   Procedure: ESOPHAGOGASTRODUODENOSCOPY (EGD) WITH PROPOFOL;  Surgeon: Clarene Essex, MD;  Location: [place];  Service: Endoscopy;  Laterality: N/A;   ESOPHAGOGASTRODUODENOSCOPY (EGD) WITH PROPOFOL N/A 08/10/2020  Procedure: ESOPHAGOGASTRODUODENOSCOPY (EGD) WITH PROPOFOL;  Surgeon: Wilford Corner, MD;  Location: [place];  Service: Endoscopy;  Laterality: N/A;   ESOPHAGOGASTRODUODENOSCOPY (EGD) WITH PROPOFOL N/A 11/20/2020   Procedure: ESOPHAGOGASTRODUODENOSCOPY (EGD) WITH PROPOFOL;  Surgeon: Ronald Lobo, MD;  Location: [facility];  Service: Endoscopy;  Laterality: N/A;   GIVENS CAPSULE STUDY N/A 11/20/2020   Procedure: GIVENS CAPSULE STUDY;  Surgeon: Ronald Lobo, MD;  Location: [facility];  Service: Endoscopy;  Laterality: N/A;   HERNIA REPAIR     INTRAOPERATIVE TRANSESOPHAGEAL ECHOCARDIOGRAM N/A 03/31/2014    Procedure: INTRAOPERATIVE TRANSESOPHAGEAL ECHOCARDIOGRAM;  Surgeon: Melrose Nakayama, MD;  Location: [place];  Service: Open Heart Surgery;  Laterality: N/A;   IR ANGIOGRAM SELECTIVE EACH ADDITIONAL VESSEL  06/16/2020   IR ANGIOGRAM SELECTIVE EACH ADDITIONAL VESSEL  06/16/2020   IR ANGIOGRAM SELECTIVE EACH ADDITIONAL VESSEL  06/16/2020   IR ANGIOGRAM SELECTIVE EACH ADDITIONAL VESSEL  06/16/2020   IR ANGIOGRAM SELECTIVE EACH ADDITIONAL VESSEL  06/16/2020   IR ANGIOGRAM SELECTIVE EACH ADDITIONAL VESSEL  06/16/2020   IR ANGIOGRAM SELECTIVE EACH ADDITIONAL VESSEL  06/16/2020   IR ANGIOGRAM SELECTIVE EACH ADDITIONAL VESSEL  06/16/2020   IR ANGIOGRAM SELECTIVE EACH ADDITIONAL VESSEL  06/16/2020   IR ANGIOGRAM SELECTIVE EACH ADDITIONAL VESSEL  06/30/2020   IR ANGIOGRAM SELECTIVE EACH ADDITIONAL VESSEL  06/30/2020   IR ANGIOGRAM VISCERAL SELECTIVE  06/16/2020   IR ANGIOGRAM VISCERAL SELECTIVE  06/30/2020   IR EMBO ARTERIAL NOT HEMORR HEMANG INC GUIDE ROADMAPPING  06/16/2020   IR EMBO TUMOR ORGAN ISCHEMIA INFARCT INC GUIDE ROADMAPPING  06/30/2020   IR EMBO VENOUS NOT HEMORR HEMANG  INC GUIDE ROADMAPPING  01/13/2021   IR IVUS EACH ADDITIONAL NON CORONARY VESSEL  01/13/2021   IR RADIOLOGIST EVAL & MGMT  05/06/2020   IR RADIOLOGIST EVAL & MGMT  05/20/2020   IR TIPS  01/13/2021   IR US GUIDE VASC ACCESS RIGHT  06/16/2020   IR US GUIDE VASC ACCESS RIGHT  06/30/2020   IR US GUIDE VASC ACCESS RIGHT  01/13/2021   LAPAROSCOPIC UMBILICAL HERNIA REPAIR W/ MESH  06-06-2011   LEFT HEART CATHETERIZATION WITH CORONARY ANGIOGRAM N/A 03/28/2014   Procedure: LEFT HEART CATHETERIZATION WITH CORONARY ANGIOGRAM;  Surgeon: Sinclair Grooms, MD;  Location: [facility] CATH LAB;  Service: Cardiovascular;  Laterality: N/A;   LIPOMA EXCISION Left 08/25/2017   Procedure: EXCISION LIPOMA LEFT POSTERIOR THIGH;  Surgeon: Clovis Riley, MD;  Location: [place];  Service: General;  Laterality: Left;   NEPHROLITHOTOMY  1990'S   OPEN  APPENDECTOMY W/ PARTIAL CECECTOMY  05-16-2004   PERCUTANEOUS CORONARY STENT INTERVENTION (PCI-S) N/A 06/28/2012   Procedure: PERCUTANEOUS CORONARY STENT INTERVENTION (PCI-S);  Surgeon: Sherren Mocha, MD;  Location: [facility] CATH LAB;  Service: Cardiovascular;  Laterality: N/A;   POLYPECTOMY  11/20/2020   Procedure: POLYPECTOMY;  Surgeon: Ronald Lobo, MD;  Location: [facility];  Service: Endoscopy;;   RADIOLOGY WITH ANESTHESIA N/A 01/13/2021   Procedure: IR WITH ANESTHESIA - TIPS;  Surgeon: Suzette Battiest, MD;  Location: [place];  Service: Radiology;  Laterality: N/A;     reports that he quit smoking about 24 years ago. His smoking use included cigarettes. He has a 80.00 pack-year smoking history. He has never used smokeless tobacco. He reports previous alcohol use of about 8.0 standard drinks of alcohol per week. He reports that he does not use drugs. Social History  Allergies  Allergen Reactions   Fluzone Quadrivalent [Influenza Vac Split Quad] Other (See Comments)    The patient  stated, in 10/2020: "I am not taking any more flu shots. It liked to have killed me."    Family History  Problem Relation Age of Onset   Lung cancer Sister    Cancer Sister        lung   Cancer Mother    Cancer Father        died in his 67s.   Cancer Brother        lung   Coronary artery disease Other    Diabetes Other    Colon cancer Other    Cancer Sister        lung     Prior to Admission medications   Medication Sig Start Date End Date Taking? Authorizing Provider  Accu-Chek Softclix Lancets lancets USE AS DIRECTED TO TEST BLOOD SUGAR FOUR TIMES DAILY Patient not taking: Reported on 02/19/2021 03/13/20   [name], MD  albuterol (VENTOLIN HFA) 108 (90 Base) MCG/ACT inhaler Inhale 2 puffs into the lungs every 6 (six) hours as needed for wheezing or shortness of breath. 03/12/20   [name], MD  blood glucose meter kit and supplies Dispense based on patient and insurance  preference. Use up to four times daily as directed. (FOR ICD-10 E10.9, E11.9). Patient not taking: Reported on 02/19/2021 03/12/20   [name], MD  furosemide (LASIX) 40 MG tablet Take 1 tablet (40 mg total) by mouth daily. 01/31/21   Elgergawy, Silver Huguenin, MD  glimepiride (AMARYL) 2 MG tablet TAKE 1 TABLET(2 MG) BY MOUTH DAILY BEFORE BREAKFAST Patient taking differently: Take 2 mg by mouth daily with breakfast. 12/16/20   [name], MD  hydrocortisone (ANUSOL-HC) 2.5 % rectal cream Place 1 application rectally 2 (two) times daily. Patient not taking: Reported on 02/19/2021 12/04/20   [name], MD  lactulose (CHRONULAC) 10 GM/15ML solution Take 30 mLs (20 g total) by mouth 3 (three) times daily. 01/30/21   Elgergawy, Silver Huguenin, MD  metFORMIN (GLUCOPHAGE) 850 MG tablet TAKE 1 TABLET(850 MG) BY MOUTH TWICE DAILY WITH A MEAL Patient taking differently: Take 850 mg by mouth 2 (two) times daily with a meal. 01/08/21   [name], MD  nitroGLYCERIN (NITROSTAT) 0.4 MG SL tablet Place 1 tablet (0.4 mg total) under the tongue every 5 (five) minutes as needed for chest pain. 06/08/16   Nahser, Wonda Cheng, MD  pantoprazole (PROTONIX) 40 MG tablet Take 1 tablet (40 mg total) by mouth 2 (two) times daily. 02/23/21   [name], MD  umeclidinium-vilanterol (ANORO ELLIPTA) 62.5-25 MCG/INH AEPB Inhale 1 puff into the lungs daily. 03/12/20   [name], MD    Physical Exam: Vitals:   02/27/21 1754 02/27/21 1759 02/27/21 1930 02/27/21 2000  BP: (!) 151/55  130/67 134/64  Pulse: 89  65 67  Resp: (!) '24  20 20  ' Temp: 98.7 F (37.1 C)     TempSrc: Oral     SpO2: 100%  100% 100%  Weight:  111.1 kg    Height:  '5\' 8"'  (1.727 m)      Constitutional: NAD, calm, comfortable, obesity chronically ill appearing male with laying flat in bed Vitals:   02/27/21 1754 02/27/21 1759 02/27/21 1930 02/27/21 2000  BP: (!) 151/55  130/67 134/64  Pulse: 89  65 67  Resp:  (!) '24  20 20  ' Temp: 98.7 F (37.1 C)     TempSrc: Oral     SpO2: 100%  100% 100%  Weight:  111.1 kg    Height:  '5\' 8"'  (1.727 m)     Eyes: PERRL, lids and conjunctivae normal ENMT: Mucous membranes are moist.  Neck: normal, supple Respiratory: clear to auscultation bilaterally, no wheezing, no crackles. Normal respiratory effort. No accessory muscle use.  Cardiovascular: Regular rate and rhythm, no murmurs / rubs / gallops. Large truncal LE with +4 pitting edema.  Abdomen: no tenderness, no masses palpated.  Bowel sounds positive.  Musculoskeletal: no clubbing / cyanosis. No joint deformity upper and lower extremities. Good ROM, no contractures. Normal muscle tone.  Skin: no rashes, lesions, ulcers. No induration Neurologic: CN 2-12 grossly intact. Sensation intact,  Strength 5/5 in all 4.  Psychiatric: Normal judgment and insight. Alert and oriented x 3 with slow speech. Normal mood.    Labs on Admission: I have personally reviewed following labs and imaging studies  CBC: Recent Labs  Lab 02/25/21 1403 02/27/21 1816  WBC 2.7* 2.9*  NEUTROABS 1.7  --   HGB 6.0 Repeated and verified X2.* 5.9*  HCT 19.4 Repeated and verified X2.* 21.0*  MCV 72.9* 78.4*  PLT 143.0* 657   Basic Metabolic Panel: Recent Labs  Lab 02/27/21 1816  NA 136  K 3.3*  CL 107  CO2 20*  GLUCOSE 77  BUN 13  CREATININE 0.90  CALCIUM 8.0*   GFR: Estimated Creatinine Clearance: 87.1 mL/min (by C-G formula based on SCr of 0.9 mg/dL). Liver Function Tests: Recent Labs  Lab 02/27/21 1816  AST 35  ALT 26  ALKPHOS 197*  BILITOT 1.1  PROT 6.1*  ALBUMIN 2.8*   No results for input(s): LIPASE, AMYLASE in the last 168 hours. No results for input(s): AMMONIA in the last 168 hours. Coagulation Profile: Recent Labs  Lab 02/27/21 1822  INR 1.2   Cardiac Enzymes: No results for input(s): CKTOTAL, CKMB, CKMBINDEX, TROPONINI in the last 168 hours. BNP (last 3 results) No results for input(s):  PROBNP in the last 8760 hours. HbA1C: No results for input(s): HGBA1C in the last 72 hours. CBG: No results for input(s): GLUCAP in the last 168 hours. Lipid Profile: No results for input(s): CHOL, HDL, LDLCALC, TRIG, CHOLHDL, LDLDIRECT in the last 72 hours. Thyroid Function Tests: No results for input(s): TSH, T4TOTAL, FREET4, T3FREE, THYROIDAB in the last 72 hours. Anemia Panel: No results for input(s): VITAMINB12, FOLATE, FERRITIN, TIBC, IRON, RETICCTPCT in the last 72 hours. Urine analysis:    Component Value Date/Time   COLORURINE YELLOW 01/22/2021 1856   APPEARANCEUR CLEAR 01/22/2021 1856   LABSPEC 1.014 01/22/2021 1856   PHURINE 6.0 01/22/2021 1856   GLUCOSEU NEGATIVE 01/22/2021 1856   HGBUR NEGATIVE 01/22/2021 1856   BILIRUBINUR NEGATIVE 01/22/2021 1856   KETONESUR NEGATIVE 01/22/2021 1856   PROTEINUR 30 (A) 01/22/2021 1856   UROBILINOGEN 0.2 03/29/2014 1546   NITRITE NEGATIVE 01/22/2021 1856   LEUKOCYTESUR NEGATIVE 01/22/2021 1856    Radiological Exams on Admission: No results found.    Assessment/Plan  Recurrent symptomatic anemia Hepatic cirrhosis with portal hypertension and esophageal varices Hemoglobin 5.9 on admission.  Reports BRBPR about 2 days ago. -Had recent admission in May requiring TIPS procedure with embolization of gastric varices by IR on 01/13/2021 -Transfusing 2 units PRBCs -Start IV Protonix infusion  -Keep n.p.o. after midnight -Secure chat to [facility] for a.m. consult placed -continue Lactulose  Chronic HFpEF -pt has hx of fluid overload following transfusions in the past. He has significant +4 pitting edema on exam -Give IV 71m Lasix after first unit of blood.  Continue to monitor with any addition IV Lasix.  CKD stage IIIa Monitor with diuresis.  CAD s/p CABG Stable, denies any chest pain.  Not on antiplatelets given recurrent anemia/GI bleed.  Continue statin.  Type 2 diabetes Last A1c 5.4% 01/07/2021.  Holding home Glimepiride  and metformin.  Add sliding scale insulin if needed.  Hypertension Stable   Asthma Continue Breo and albuterol as needed.  Hyperlipidemia Continue atorvastatin  DVT prophylaxis: SCDs Code Status: Full Family Communication: Plan discussed with patient at bedside  disposition Plan: Home with at least 2 midnight stays  Consults called: Consult to [facility] placed for the morning Admission status: inpatient  Level of care: Stepdown  Status is: Inpatient  Remains inpatient appropriate because:Inpatient level of care appropriate due to severity of illness  Dispo: The patient is from: Home              Anticipated d/c is to: Home              Patient currently is not medically stable to d/c.   Difficult to place patient No         Orene Desanctis DO [facility]   If 7PM-7AM, please contact night-coverage [email or web address]   02/27/2021, 8:32 PM

## 2021-02-27 NOTE — ED Provider Notes (Signed)
Emergency Medicine Provider Triage Evaluation Note  [name] , a 75 y.o. male  was evaluated in triage.  Pt sent by PCP for low hemoglobin.  Had lab work done on Thursday, and was called yesterday and told his hemoglobin was 6.0.  Told to come to the ED yesterday but he was out fishing and so did not come to the hospital until today.  He has had to have transfusions recently and was recently admitted with low hemoglobin and hepatic encephalopathy in the setting of alcoholic cirrhosis.  Patient denies any dark black or bloody stools, no emesis.  Reports he has been feeling well and at baseline, has some chronic shortness of breath, no chest pain, no lightheadedness or syncope.  Review of Systems  Positive: None Negative: Chest pain, lightheadedness, syncope, fatigue, blood in stool, abdominal pain  Physical Exam  BP (!) 151/55 (BP Location: Right Arm)   Pulse 89   Temp 98.7 F (37.1 C) (Oral)   Resp (!) 24   Ht 5\' 8"  (1.727 m)   Wt 111.1 kg   SpO2 100%   BMI 37.25 kg/m  Gen:   Awake, no distress , chronically ill-appearing Resp:  Normal effort  MSK:   Moves extremities without difficulty  Other:    Medical Decision Making  Medically screening exam initiated at 6:11 PM.  Appropriate orders placed.  [name] was informed that the remainder of the evaluation will be completed by another provider, this initial triage assessment does not replace that evaluation, and the importance of remaining in the ED until their evaluation is complete.     Jacqlyn Larsen, PA-C 02/27/21 1836    Sherwood Gambler, MD 02/27/21 2239

## 2021-02-28 ENCOUNTER — Inpatient Hospital Stay (HOSPITAL_COMMUNITY): Payer: Medicare HMO | Admitting: Certified Registered"

## 2021-02-28 ENCOUNTER — Other Ambulatory Visit: Payer: Self-pay

## 2021-02-28 ENCOUNTER — Encounter (HOSPITAL_COMMUNITY): Payer: Self-pay | Admitting: Family Medicine

## 2021-02-28 ENCOUNTER — Encounter (HOSPITAL_COMMUNITY)
Admission: EM | Disposition: A | Discharge status: Home / Self Care | Payer: Self-pay | Source: Home / Self Care | Attending: Internal Medicine

## 2021-02-28 HISTORY — PX: ESOPHAGOGASTRODUODENOSCOPY (EGD) WITH PROPOFOL: SHX5813

## 2021-02-28 LAB — MRSA NEXT GEN BY PCR, NASAL: MRSA by PCR Next Gen: NOT DETECTED

## 2021-02-28 LAB — GLUCOSE, CAPILLARY
Glucose-Capillary: 103 mg/dL — ABNORMAL HIGH (ref 70–99)
Glucose-Capillary: 85 mg/dL (ref 70–99)
Glucose-Capillary: 86 mg/dL (ref 70–99)
Glucose-Capillary: 86 mg/dL (ref 70–99)
Glucose-Capillary: 223 mg/dL — ABNORMAL HIGH (ref 70–99)

## 2021-02-28 LAB — CBC
HCT: 23.6 % — ABNORMAL LOW (ref 39.0–52.0)
Hemoglobin: 7 g/dL — ABNORMAL LOW (ref 13.0–17.0)
MCH: 23.6 pg — ABNORMAL LOW (ref 26.0–34.0)
MCHC: 29.7 g/dL — ABNORMAL LOW (ref 30.0–36.0)
MCV: 79.5 fL — ABNORMAL LOW (ref 80.0–100.0)
Platelets: 130 10*3/uL — ABNORMAL LOW (ref 150–400)
RBC: 2.97 MIL/uL — ABNORMAL LOW (ref 4.22–5.81)
RDW: 19.6 % — ABNORMAL HIGH (ref 11.5–15.5)
WBC: 2.7 10*3/uL — ABNORMAL LOW (ref 4.0–10.5)
nRBC: 0 % (ref 0.0–0.2)

## 2021-02-28 LAB — PREPARE RBC (CROSSMATCH)

## 2021-02-28 LAB — HEMOGLOBIN AND HEMATOCRIT, BLOOD
HCT: 28.5 % — ABNORMAL LOW (ref 39.0–52.0)
Hemoglobin: 8.3 g/dL — ABNORMAL LOW (ref 13.0–17.0)

## 2021-02-28 SURGERY — ESOPHAGOGASTRODUODENOSCOPY (EGD) WITH PROPOFOL
Anesthesia: Monitor Anesthesia Care

## 2021-02-28 MED ORDER — SODIUM CHLORIDE 0.9% IV SOLUTION: Freq: Once | INTRAVENOUS | Status: AC

## 2021-02-28 MED ORDER — SODIUM CHLORIDE 0.9 % IV SOLN
50.0000 ug/h | INTRAVENOUS | Status: DC
  Administered 2021-02-28: 50 ug/h via INTRAVENOUS
  Filled 2021-02-28: qty 1

## 2021-02-28 MED ORDER — ONDANSETRON HCL 4 MG/2ML IJ SOLN: 4.0000 mg | Freq: Once | INTRAMUSCULAR | Status: DC | PRN

## 2021-02-28 MED ORDER — SODIUM CHLORIDE 0.9 % IV SOLN: INTRAVENOUS | Status: DC | PRN

## 2021-02-28 MED ORDER — FUROSEMIDE 10 MG/ML IJ SOLN
40.0000 mg | Freq: Every day | INTRAMUSCULAR | Status: DC
  Administered 2021-02-28 – 2021-03-01 (×2): 40 mg via INTRAVENOUS
  Filled 2021-02-28 (×2): qty 4

## 2021-02-28 MED ORDER — INSULIN ASPART 100 UNIT/ML IJ SOLN
0.0000 [IU] | INTRAMUSCULAR | Status: DC
  Administered 2021-02-28: 5 [IU] via SUBCUTANEOUS

## 2021-02-28 MED ORDER — CHLORHEXIDINE GLUCONATE CLOTH 2 % EX PADS
6.0000 | MEDICATED_PAD | Freq: Every day | CUTANEOUS | Status: DC
  Administered 2021-02-28: 6 via TOPICAL

## 2021-02-28 MED ORDER — PANTOPRAZOLE INFUSION (NEW) - SIMPLE MED
8.0000 mg/h | INTRAVENOUS | Status: DC
  Administered 2021-02-28: 8 mg/h via INTRAVENOUS
  Filled 2021-02-28: qty 100
  Filled 2021-02-28: qty 80

## 2021-02-28 MED ORDER — PANTOPRAZOLE SODIUM 40 MG PO TBEC
40.0000 mg | DELAYED_RELEASE_TABLET | Freq: Every day | ORAL | Status: DC
  Administered 2021-02-28 – 2021-03-01 (×2): 40 mg via ORAL
  Filled 2021-02-28 (×2): qty 1

## 2021-02-28 MED ORDER — ORAL CARE MOUTH RINSE
15.0000 mL | Freq: Two times a day (BID) | OROMUCOSAL | Status: DC
  Administered 2021-02-28 – 2021-03-01 (×3): 15 mL via OROMUCOSAL

## 2021-02-28 MED ORDER — LIDOCAINE 2% (20 MG/ML) 5 ML SYRINGE
INTRAMUSCULAR | Status: DC | PRN
  Administered 2021-02-28: 40 mg via INTRAVENOUS

## 2021-02-28 MED ORDER — PROPOFOL 10 MG/ML IV BOLUS
INTRAVENOUS | Status: DC | PRN
  Administered 2021-02-28: 20 mg via INTRAVENOUS
  Administered 2021-02-28: 10 mg via INTRAVENOUS
  Administered 2021-02-28 (×5): 20 mg via INTRAVENOUS

## 2021-02-28 MED ORDER — PANTOPRAZOLE SODIUM 40 MG IV SOLR: 40.0000 mg | Freq: Two times a day (BID) | INTRAVENOUS | Status: DC

## 2021-02-28 MED ORDER — SODIUM CHLORIDE 0.9 % IV SOLN: INTRAVENOUS | Status: DC

## 2021-02-28 MED ORDER — SODIUM CHLORIDE 0.9 % IV SOLN
50.0000 ug/h | INTRAVENOUS | Status: DC
  Administered 2021-02-28: 50 ug/h via INTRAVENOUS
  Filled 2021-02-28 (×3): qty 1

## 2021-02-28 MED ORDER — DEXTROSE 50 % IV SOLN
INTRAVENOUS | Status: AC
  Administered 2021-02-28: 50 mL
  Filled 2021-02-28: qty 50

## 2021-02-28 SURGICAL SUPPLY — 14 items

## 2021-02-28 NOTE — Progress Notes (Signed)
Patient ID: [name], male   DOB: 08/06/1946, 75 y.o.   MRN: [id number]  PROGRESS NOTE    [name]  KTG:[id number] DOB: [birth year] DOA: 02/27/2021 PCP: [name], MD   Brief Narrative:  75 y.o. male with medical history significant for  past medical history significant for hepatocellular carcinoma, hepatic cirrhosis with portal HTN and esophageal varices, multiple recent hospitalizations for GI bleeding with history of TIPS and embolization of gastric varices by IR on 01/13/2021, hepatic encephalopathy, CABG, CKD stage IIIa, PUD, gout, HLD, IDA, OSA not on CPAP, type 2 diabetes mellitus, essential hypertension, HFpEF (EF of 60-65% with grade 2 diastolic [id number]/2034) , osteoarthritis, chronic low back pain who presented for low hemoglobin at the recommendation of PCP.  Patient had bright red blood with bowel movement few days ago prior to presentation.  On presentation, hemoglobin was 5.9.  He was transfused 2 units packed red cells.  GI was consulted.  Assessment & Plan:   GI bleeding in a patient with history of multiple admissions for GI bleeding in the setting of portal hypertension and esophageal varices Recurrent symptomatic anemia -History of TIPS with embolization of gastric varices by IR on 01/13/2021.  Hemoglobin 5.9 on presentation.  Transfused 2 units packed red cells.  Hemoglobin 7 this morning.  Will transfuse 1 more unit of packed red cell.  Monitor H&H.  Start octreotide and Protonix drips. -N.p.o. for now.  GI evaluation has been requested.  Decompensated cirrhosis of liver with portal hypertension, esophageal varices and history of hypertension cephalopathy -Strict input and output.  Daily weights.  Continue Lasix.  Fluid restriction.  Monitor mental status.  Resume lactulose once able to take orally  Chronic diastolic heart failure -Switch Lasix to IV for now.  CKD stage IIIa -Repeat a.m. labs.  Hypokalemia -No labs today.  Repeat a.m. labs  CAD  status post CABG -Stable.  Not on antiplatelets given recurrent anemia/GI bleed.  Continue statin once able to take orally  Diabetes mellitus type 2 -A1c 5.4 on 01/07/2021.  Glipizide and metformin on hold.  Continue CBGs with SSI  Hypertension  -blood pressure stable.  Monitor  Asthma  -currently stable.  Continue current inhalers.  Hyperlipidemia -Resume statin once able to take orally   DVT prophylaxis: SCDs Code Status: Full Family Communication: None at bedside Disposition Plan: Status is: Inpatient  Remains inpatient appropriate because:Inpatient level of care appropriate due to severity of illness  Dispo: The patient is from: Home              Anticipated d/c is to: Home              Patient currently is not medically stable to d/c.   Difficult to place patient No   Consultants: GI  Procedures: None  Antimicrobials: None   Subjective: Patient seen and examined at bedside.  Poor historian.  Denies worsening abdominal pain, hematemesis, shortness of breath.  Objective: Vitals:   02/28/21 0437 02/28/21 0615 02/28/21 0734 02/28/21 0849  BP: 117/89 (!) 133/43 108/85 (!) 127/59  Pulse: 68 72 71 67  Resp: 18 18 15 16   Temp: 98.2 F (36.8 C)  97.8 F (36.6 C) 97.8 F (36.6 C)  TempSrc: Oral  Oral Oral  SpO2: 98% 98% 98% 99%  Weight:      Height:        Intake/Output Summary (Last 24 hours) at 02/28/2021 1000 Last data filed at 02/28/2021 0433 Gross per 24 hour  Intake 660 ml  Output 2400 ml  Net -1740 ml   Filed Weights   02/27/21 1759  Weight: 111.1 kg    Examination:  General exam: Appears calm and comfortable.  Looks chronically ill.  On room air currently Respiratory system: Bilateral decreased breath sounds at bases with scattered crackles Cardiovascular system: S1 & S2 heard, Rate controlled Gastrointestinal system: Abdomen is nondistended, soft and nontender. Normal bowel sounds heard. Extremities: No cyanosis, clubbing; trace lower extremity  edema Central nervous system: Awake, slow to respond to questions.  Poor historian.  No focal neurological deficits. Moving extremities Skin: No rashes, lesions or ulcers Psychiatry: Flat affect    Data Reviewed: I have personally reviewed following labs and imaging studies  CBC: Recent Labs  Lab 02/25/21 1403 02/27/21 1816 02/28/21 0439  WBC 2.7* 2.9* 2.7*  NEUTROABS 1.7  --   --   HGB 6.0 Repeated and verified X2.* 5.9* 7.0*  HCT 19.4 Repeated and verified X2.* 21.0* 23.6*  MCV 72.9* 78.4* 79.5*  PLT 143.0* 159 767*   Basic Metabolic Panel: Recent Labs  Lab 02/27/21 1816  NA 136  K 3.3*  CL 107  CO2 20*  GLUCOSE 77  BUN 13  CREATININE 0.90  CALCIUM 8.0*   GFR: Estimated Creatinine Clearance: 87.1 mL/min (by C-G formula based on SCr of 0.9 mg/dL). Liver Function Tests: Recent Labs  Lab 02/27/21 1816  AST 35  ALT 26  ALKPHOS 197*  BILITOT 1.1  PROT 6.1*  ALBUMIN 2.8*   No results for input(s): LIPASE, AMYLASE in the last 168 hours. No results for input(s): AMMONIA in the last 168 hours. Coagulation Profile: Recent Labs  Lab 02/27/21 1822  INR 1.2   Cardiac Enzymes: No results for input(s): CKTOTAL, CKMB, CKMBINDEX, TROPONINI in the last 168 hours. BNP (last 3 results) No results for input(s): PROBNP in the last 8760 hours. HbA1C: No results for input(s): HGBA1C in the last 72 hours. CBG: No results for input(s): GLUCAP in the last 168 hours. Lipid Profile: No results for input(s): CHOL, HDL, LDLCALC, TRIG, CHOLHDL, LDLDIRECT in the last 72 hours. Thyroid Function Tests: No results for input(s): TSH, T4TOTAL, FREET4, T3FREE, THYROIDAB in the last 72 hours. Anemia Panel: No results for input(s): VITAMINB12, FOLATE, FERRITIN, TIBC, IRON, RETICCTPCT in the last 72 hours. Sepsis Labs: No results for input(s): PROCALCITON, LATICACIDVEN in the last 168 hours.  Recent Results (from the past 240 hour(s))  Resp Panel by RT-PCR (Flu A&B, Covid)  Nasopharyngeal Swab     Status: None   Collection Time: 02/27/21  7:51 PM   Specimen: Nasopharyngeal Swab; Nasopharyngeal(NP) swabs in vial transport medium  Result Value Ref Range Status   SARS Coronavirus 2 by RT PCR NEGATIVE NEGATIVE Final    Comment: (NOTE) SARS-CoV-2 target nucleic acids are NOT DETECTED.  The SARS-CoV-2 RNA is generally detectable in upper respiratory specimens during the acute phase of infection. The lowest concentration of SARS-CoV-2 viral copies this assay can detect is 138 copies/mL. A negative result does not preclude SARS-Cov-2 infection and should not be used as the sole basis for treatment or other patient management decisions. A negative result may occur with  improper specimen collection/handling, submission of specimen other than nasopharyngeal swab, presence of viral mutation(s) within the areas targeted by this assay, and inadequate number of viral copies(<138 copies/mL). A negative result must be combined with clinical observations, patient history, and epidemiological information. The expected result is Negative.  Fact Sheet for Patients:  EntrepreneurPulse.com.au  Fact Sheet for Healthcare Providers:  IncredibleEmployment.be  This test is no t yet approved or cleared by the [place] and  has been authorized for detection and/or diagnosis of SARS-CoV-2 by FDA under an Emergency Use Authorization (EUA). This EUA will remain  in effect (meaning this test can be used) for the duration of the COVID-19 declaration under Section 564(b)(1) of the Act, 21 U.S.C.section 360bbb-3(b)(1), unless the authorization is terminated  or revoked sooner.       Influenza A by PCR NEGATIVE NEGATIVE Final   Influenza B by PCR NEGATIVE NEGATIVE Final    Comment: (NOTE) The Xpert Xpress SARS-CoV-2/FLU/RSV plus assay is intended as an aid in the diagnosis of influenza from Nasopharyngeal swab specimens and should not be  used as a sole basis for treatment. Nasal washings and aspirates are unacceptable for Xpert Xpress SARS-CoV-2/FLU/RSV testing.  Fact Sheet for Patients: EntrepreneurPulse.com.au  Fact Sheet for Healthcare Providers: IncredibleEmployment.be  This test is not yet approved or cleared by the [place] FDA and has been authorized for detection and/or diagnosis of SARS-CoV-2 by FDA under an Emergency Use Authorization (EUA). This EUA will remain in effect (meaning this test can be used) for the duration of the COVID-19 declaration under Section 564(b)(1) of the Act, 21 U.S.C. section 360bbb-3(b)(1), unless the authorization is terminated or revoked.  Performed at [facility], [place] [address], [place],  [address]          Radiology Studies: No results found.      Scheduled Meds:  sodium chloride   Intravenous Once   furosemide  40 mg Oral Daily   insulin aspart  0-15 Units Subcutaneous Q4H   lactulose  20 g Oral TID   [START ON 03/03/2021] pantoprazole  40 mg Intravenous Q12H   umeclidinium-vilanterol  1 puff Inhalation Daily   Continuous Infusions:  octreotide  (SANDOSTATIN)    IV infusion 50 mcg/hr (02/28/21 0947)   pantoprazole 8 mg/hr (02/28/21 0815)          Aline August, MD [facility] 02/28/2021, 10:00 AM

## 2021-02-28 NOTE — Transfer of Care (Signed)
Immediate Anesthesia Transfer of Care Note  Patient: [name]  Procedure(s) Performed: ESOPHAGOGASTRODUODENOSCOPY (EGD) WITH PROPOFOL  Patient Location: PACU  Anesthesia Type:MAC  Level of Consciousness: sedated  Airway & Oxygen Therapy: Patient Spontanous Breathing and Patient connected to face mask oxygen  Post-op Assessment: Report given to RN and Post -op Vital signs reviewed and stable  Post vital signs: stable  Last Vitals:  Vitals Value Taken Time  BP 129/53 02/28/21 1337  Temp    Pulse 64 02/28/21 1339  Resp 13 02/28/21 1339  SpO2 100 % 02/28/21 1339  Vitals shown include unvalidated device data.  Last Pain:  Vitals:   02/28/21 1302  TempSrc: Oral  PainSc:          Complications: No notable events documented.

## 2021-02-28 NOTE — Op Note (Signed)
[facility] Patient Name: [name] Procedure Date: 02/28/2021 MRN: [id number] Attending MD: Clarene Essex , MD Date of Birth: [birth year] CSN: [id number] Age: 75 Admit Type: Inpatient Procedure:                Upper GI endoscopy Indications:              Iron deficiency anemia secondary to chronic blood                            loss, Heme positive stool Providers:                Clarene Essex, MD, Doristine Johns, RN, Laverda Sorenson, Technician, Glenis Smoker, CRNA Referring MD:              Medicines:                Propofol total dose 120 mg IV, 40 mg IV lidocaine Complications:            No immediate complications. Estimated Blood Loss:     Estimated blood loss: none. Procedure:                Pre-Anesthesia Assessment:                           - Prior to the procedure, a History and Physical                            was performed, and patient medications and                            allergies were reviewed. The patient's tolerance of                            previous anesthesia was also reviewed. The risks                            and benefits of the procedure and the sedation                            options and risks were discussed with the patient.                            All questions were answered, and informed consent                            was obtained. Prior Anticoagulants: The patient has                            taken no previous anticoagulant or antiplatelet                            agents. ASA Grade Assessment: III - A patient with  severe systemic disease. After reviewing the risks                            and benefits, the patient was deemed in                            satisfactory condition to undergo the procedure.                           After obtaining informed consent, the endoscope was                            passed under direct vision. Throughout the                             procedure, the patient's blood pressure, pulse, and                            oxygen saturations were monitored continuously. The                            [id number]-H190 ([id number]) Olympus gastroscope was                            introduced through the mouth, and advanced to the                            third part of duodenum. The upper GI endoscopy was                            accomplished without difficulty. The patient                            tolerated the procedure well. Scope In: Scope Out: Findings:      The larynx was normal.      A Tiny hiatal hernia was present. There is no obvious distal esophageal       varices      A single smaller varix with no bleeding were found in the cardia. There       were no stigmata of recent bleeding. They were small in largest diameter.      Mild portal hypertensive gastropathy was found in the gastric body       seemingly improved.      The duodenal bulb, first portion of the duodenum, second portion of the       duodenum and third portion of the duodenum were normal.      The exam was otherwise without abnormality without signs of bleeding.      Localized minimal inflammation characterized by erythema was found in       the gastric antrum. Impression:               - Normal larynx.                           - Tiny hiatal hernia.                           -  Gastric varices, without bleeding.                           - Portal hypertensive gastropathy.                           - Normal duodenal bulb, first portion of the                            duodenum, second portion of the duodenum and third                            portion of the duodenum.                           - The examination was otherwise normal.                           - Chronic gastritis.                           - No specimens collected. Moderate Sedation:      Not Applicable - Patient had care per Anesthesia. Recommendation:           - Soft diet  today. Doubt he needs any further                            inpatient GI evaluation at this time                           - Continue present medications. Okay to wean                            octreotide per pharmacy routine                           - Return to GI clinic PRN. Will need referral back                            to oncology for follow-up as well as interventional                            radiology follow-up per their routine and consider                            asking oncology to see if he needs erythropoietin                            or IV iron periodically to assist with probable                            chronic GI blood loss                           - Telephone GI clinic if symptomatic PRN. Procedure Code(s):        ---  Professional ---                           [phone], Esophagogastroduodenoscopy, flexible,                            transoral; diagnostic, including collection of                            specimen(s) by brushing or washing, when performed                            (separate procedure) Diagnosis Code(s):        --- Professional ---                           K44.9, Diaphragmatic hernia without obstruction or                            gangrene                           I86.4, Gastric varices                           K76.6, Portal hypertension                           K31.89, Other diseases of stomach and duodenum                           D50.0, Iron deficiency anemia secondary to blood                            loss (chronic)                           R19.5, Other fecal abnormalities CPT copyright 2019 [employer]. All rights reserved. The codes documented in this report are preliminary and upon coder review may  be revised to meet current compliance requirements. Clarene Essex, MD 02/28/2021 1:37:48 PM This report has been signed electronically. Number of Addenda: 0

## 2021-02-28 NOTE — Consult Note (Signed)
Reason for Consult: Guaiac positive anemia in patient with cirrhosis Referring Physician: Hospital team  [name] is an 75 y.o. male.  HPI: Patient known to our team with multiple admissions for GI blood loss encephalopathy etc. he did have a TIPS recently and then was readmitted with encephalopathy and has good days and bad at home but he denies seeing any blood although there is a question of some bright red blood a few days ago in the chart and both his hospital computer chart and our office computer chart was reviewed and his previous work-up was reviewed and currently he is not having any complaints but had some increased fatigue and was admitted for recurrent guaiac positive anemia we are consulted for further work-up and plans  Past Medical History:  Diagnosis Date   Arthritis    "joints tighten up sometimes" (03/27/2104)   Carcinoma of liver, hepatocellular ([place]) 06/30/2020   CHF (congestive heart failure) (HCC)    Chronic lower back pain    Coronary artery disease    a. 05/2012 Cath/PCI: LM 40ost, 50-60d, LAD 99p ruptured plaque (3.0x28 DES), LCX 50p/m, RCA 30-40p, 43m, EF 65-70%.   Coronary artery disease involving native coronary artery of native heart without angina pectoris    Severe left main disease at catheterization July 2015  CABG x3 with a LIMA to the LAD, SVG to the OM, SVG to the PDA on 03/31/14. EF 60% by cath.    Diastolic heart failure (HCC)    GERD without esophagitis 08/04/2010   Hepatic cirrhosis ([place])    a. Dx 01/2014 - CT a/p    History of blood transfusion    "related to bleeding ulcers"   History of concussion    1976--  NO RESIDUAL   History of GI bleed    a. UGIB 07/2012;  b. 01/2014 admission with GIB/FOB stool req 1U prbc's->EGD showed portal gastropathy, barrett's esoph, and chronic active h. pylori gastritis.   History of gout    2007 &  2008  LEFT LEG-- NO ISSUE SINCE   Hyperlipidemia    Iron deficiency anemia    Kidney stones    OA (osteoarthritis  of spine)    LOWER BACK--  INTERMITTANT LEFT LEG NUMBNESS   OSA (obstructive sleep apnea)    PULMOLOGIST-  DR CLANCE--  MODERATE OSA  STARTED CPAP 2012--  BUT CURRENTLY HAS NOT USED PAST 6 MONTHS   Phimosis    a. s/p circumcision 2015.   Type 2 diabetes mellitus ([place])    Unspecified essential hypertension     Past Surgical History:  Procedure Laterality Date   ANKLE FRACTURE SURGERY Right 1989   "plate put in"   APPENDECTOMY  05-16-2004   open   BIOPSY  01/08/2021   Procedure: BIOPSY;  Surgeon: Wilford Corner, MD;  Location: [facility];  Service: Endoscopy;;   CIRCUMCISION N/A 09/09/2013   Procedure: CIRCUMCISION ADULT;  Surgeon: Bernestine Amass, MD;  Location: [facility];  Service: Urology;  Laterality: N/A;   COLECTOMY  05-16-2004   COLONOSCOPY WITH PROPOFOL N/A 11/20/2020   Procedure: COLONOSCOPY WITH PROPOFOL;  Surgeon: Ronald Lobo, MD;  [facility];  Service: Endoscopy;  Laterality: N/A;   CORONARY ANGIOPLASTY WITH STENT PLACEMENT  06/28/2012  DR COOPER   PCI W/  X1 DES to [place]. LAD/  LM  40% OSTIAL & 50-60% DISTAL /  50% PROX LCX/  30-40% PROX RCA & 50% MID RCA/   LVEF 65-70%   CORONARY ARTERY  BYPASS GRAFT N/A 03/31/2014   Procedure: CORONARY ARTERY BYPASS GRAFTING (CABG) times 3 using left internal mammary artery and right saphenous vein.;  Surgeon: Melrose Nakayama, MD;  Location: [facility];  Service: Open Heart Surgery;  Laterality: N/A;   DOBUTAMINE STRESS ECHO  06-08-2012   MODERATE HYPOKINESIS/ ISCHEMIA MID INFERIOR WALL   ESOPHAGOGASTRODUODENOSCOPY  08/15/2012   Procedure: ESOPHAGOGASTRODUODENOSCOPY (EGD);  Surgeon: Wonda Horner, MD;  Location: [facility] ENDOSCOPY;  Service: Endoscopy;  Laterality: N/A;   ESOPHAGOGASTRODUODENOSCOPY N/A 02/17/2014   Procedure: ESOPHAGOGASTRODUODENOSCOPY (EGD);  Surgeon: Jeryl Columbia, MD;  Location[facility] ENDOSCOPY;  Service: Endoscopy;  Laterality: N/A;   ESOPHAGOGASTRODUODENOSCOPY N/A 04/17/2020   Procedure:  ESOPHAGOGASTRODUODENOSCOPY (EGD);  Surgeon: Ronnette Juniper, MD;  Location: [facility];  Service: Gastroenterology;  Laterality: N/A;   ESOPHAGOGASTRODUODENOSCOPY N/A 09/11/2020   Procedure: ESOPHAGOGASTRODUODENOSCOPY (EGD);  Surgeon: Clarene Essex, MD;  Location: [facility];  Service: Endoscopy;  Laterality: N/A;   ESOPHAGOGASTRODUODENOSCOPY  10/09/2020   ESOPHAGOGASTRODUODENOSCOPY N/A 10/09/2020   Procedure: ESOPHAGOGASTRODUODENOSCOPY (EGD);  Surgeon: Otis Brace, MD;  Location: [facility] ENDOSCOPY;  Service: Gastroenterology;  Laterality: N/A;   ESOPHAGOGASTRODUODENOSCOPY N/A 01/08/2021   Procedure: ESOPHAGOGASTRODUODENOSCOPY (EGD);  Surgeon: Wilford Corner, MD;  Location: [facility];  Service: Endoscopy;  Laterality: N/A;   ESOPHAGOGASTRODUODENOSCOPY (EGD) WITH PROPOFOL N/A 09/30/2017   Procedure: ESOPHAGOGASTRODUODENOSCOPY (EGD) WITH PROPOFOL;  Surgeon: Wonda Horner, MD;  Location[facility] END[facility]COPY;  Service: Endoscopy;  Laterality: N/A;   ESOPHAGOGASTRODUODENOSCOPY (EGD) WITH PROPOFOL N/A 12/20/2017   Procedure: ESOPHAGOGASTRODUODENOSCOPY (EGD) WITH PROPOFOL;  Surgeon: Clarene Essex, MD;  Location: [place];  Service: Endoscopy;  Laterality: N/A;   ESOPHAGOGASTRODUODENOSCOPY (EGD) WITH PROPOFOL N/A 08/10/2020   Procedure: ESOPHAGOGASTRODUODENOSCOPY (EGD) WITH PROPOFOL;  Surgeon: Wilford Corner, MD;  Location: [place];  Service: Endoscopy;  Laterality: N/A;   ESOPHAGOGASTRODUODENOSCOPY (EGD) WITH PROPOFOL N/A 11/20/2020   Procedure: ESOPHAGOGASTRODUODENOSCOPY (EGD) WITH PROPOFOL;  Surgeon: Ronald Lobo, MD;  [facility];  Service: Endoscopy;  Laterality: N/A;   GIVENS CAPSULE STUDY N/A 11/20/2020   Procedure: GIVENS CAPSULE STUDY;  Surgeon: Ronald Lobo, MD;  [facility];  Service: Endoscopy;  Laterality: N/A;   HERNIA REPAIR     INTRAOPERATIVE TRANSESOPHAGEAL ECHOCARDIOGRAM N/A 03/31/2014   Procedure: INTRAOPERATIVE TRANSESOPHAGEAL ECHOCARDIOGRAM;  Surgeon:  Melrose Nakayama, MD;  Location: [place];  Service: Open Heart Surgery;  Laterality: N/A;   IR ANGIOGRAM SELECTIVE EACH ADDITIONAL VESSEL  06/16/2020   IR ANGIOGRAM SELECTIVE EACH ADDITIONAL VESSEL  06/16/2020   IR ANGIOGRAM SELECTIVE EACH ADDITIONAL VESSEL  06/16/2020   IR ANGIOGRAM SELECTIVE EACH ADDITIONAL VESSEL  06/16/2020   IR ANGIOGRAM SELECTIVE EACH ADDITIONAL VESSEL  06/16/2020   IR ANGIOGRAM SELECTIVE EACH ADDITIONAL VESSEL  06/16/2020   IR ANGIOGRAM SELECTIVE EACH ADDITIONAL VESSEL  06/16/2020   IR ANGIOGRAM SELECTIVE EACH ADDITIONAL VESSEL  06/16/2020   IR ANGIOGRAM SELECTIVE EACH ADDITIONAL VESSEL  06/16/2020   IR ANGIOGRAM SELECTIVE EACH ADDITIONAL VESSEL  06/30/2020   IR ANGIOGRAM SELECTIVE EACH ADDITIONAL VESSEL  06/30/2020   IR ANGIOGRAM VISCERAL SELECTIVE  06/16/2020   IR ANGIOGRAM VISCERAL SELECTIVE  06/30/2020   IR EMBO ARTERIAL NOT HEMORR HEMANG INC GUIDE ROADMAPPING  06/16/2020   IR EMBO TUMOR ORGAN ISCHEMIA INFARCT INC GUIDE ROADMAPPING  06/30/2020   IR EMBO VENOUS NOT HEMORR HEMANG  INC GUIDE ROADMAPPING  01/13/2021   IR IVUS EACH ADDITIONAL NON CORONARY VESSEL  01/13/2021   IR RADIOLOGIST EVAL & MGMT  05/06/2020   IR RADIOLOGIST EVAL & MGMT  05/20/2020   IR TIPS  01/13/2021   IR US GUIDE VASC ACCESS RIGHT  06/16/2020   IR US GUIDE VASC ACCESS RIGHT  06/30/2020   IR US GUIDE VASC ACCESS RIGHT  01/13/2021   LAPAROSCOPIC UMBILICAL HERNIA REPAIR W/ MESH  06-06-2011   LEFT HEART CATHETERIZATION WITH CORONARY ANGIOGRAM N/A 03/28/2014   Procedure: LEFT HEART CATHETERIZATION WITH CORONARY ANGIOGRAM;  Surgeon: Sinclair Grooms, MD;  Location: [facility] CATH LAB;  Service: Cardiovascular;  Laterality: N/A;   LIPOMA EXCISION Left 08/25/2017   Procedure: EXCISION LIPOMA LEFT POSTERIOR THIGH;  Surgeon: Clovis Riley, MD;  Location: [place];  Service: General;  Laterality: Left;   NEPHROLITHOTOMY  1990'S   OPEN APPENDECTOMY W/ PARTIAL CECECTOMY  05-16-2004   PERCUTANEOUS CORONARY STENT  INTERVENTION (PCI-S) N/A 06/28/2012   Procedure: PERCUTANEOUS CORONARY STENT INTERVENTION (PCI-S);  Surgeon: Sherren Mocha, MD;  Location: [facility] CATH LAB;  Service: Cardiovascular;  Laterality: N/A;   POLYPECTOMY  11/20/2020   Procedure: POLYPECTOMY;  Surgeon: Ronald Lobo, MD;  [facility];  Service: Endoscopy;;   RADIOLOGY WITH ANESTHESIA N/A 01/13/2021   Procedure: IR WITH ANESTHESIA - TIPS;  Surgeon: Suzette Battiest, MD;  Location: [place];  Service: Radiology;  Laterality: N/A;    Family History  Problem Relation Age of Onset   Lung cancer Sister    Cancer Sister        lung   Cancer Mother    Cancer Father        died in his 75s.   Cancer Brother        lung   Coronary artery disease Other    Diabetes Other    Colon cancer Other    Cancer Sister        lung    Social History:  reports that he quit smoking about 24 years ago. His smoking use included cigarettes. He has a 80.00 pack-year smoking history. He has never used smokeless tobacco. He reports previous alcohol use of about 8.0 standard drinks of alcohol per week. He reports that he does not use drugs.  Allergies:  Allergies  Allergen Reactions   Fluzone Quadrivalent [Influenza Vac Split Quad] Other (See Comments)    The patient stated, in 10/2020: "I am not taking any more flu shots. It liked to have killed me."    Medications: I have reviewed the patient's current medications.  Results for orders placed or performed during the hospital encounter of 02/27/21 (from the past 48 hour(s))  Comprehensive metabolic panel     Status: Abnormal   Collection Time: 02/27/21  6:16 PM  Result Value Ref Range   Sodium 136 135 - 145 mmol/L   Potassium 3.3 (L) 3.5 - 5.1 mmol/L   Chloride 107 98 - 111 mmol/L   CO2 20 (L) 22 - 32 mmol/L   Glucose, Bld 77 70 - 99 mg/dL    Comment: Glucose reference range applies only to samples taken after fasting for at least 8 hours.   BUN 13 8 - 23 mg/dL   Creatinine, Ser 0.90 0.61  - 1.24 mg/dL   Calcium 8.0 (L) 8.9 - 10.3 mg/dL   Total Protein 6.1 (L) 6.5 - 8.1 g/dL   Albumin 2.8 (L) 3.5 - 5.0 g/dL   AST 35 15 - 41 U/L   ALT 26 0 - 44 U/L   Alkaline Phosphatase 197 (H) 38 - 126 U/L   Total Bilirubin 1.1 0.3 - 1.2 mg/dL   GFR, Estimated >60 >60 mL/min    Comment: (  NOTE) Calculated using the CKD-EPI Creatinine Equation (2021)    Anion gap 9 5 - 15    Comment: Performed at [facility], [place] [address], [place], [place] [address]  CBC     Status: Abnormal   Collection Time: 02/27/21  6:16 PM  Result Value Ref Range   WBC 2.9 (L) 4.0 - 10.5 K/uL   RBC 2.68 (L) 4.22 - 5.81 MIL/uL   Hemoglobin 5.9 (LL) 13.0 - 17.0 g/dL    Comment: This critical result has verified and been called to [place] RN by Marvell Fuller on 07 02 2022 at 1851, and has been read back. CRITICAL RESULT VERIFIED   HCT 21.0 (L) 39.0 - 52.0 %   MCV 78.4 (L) 80.0 - 100.0 fL   MCH 22.0 (L) 26.0 - 34.0 pg   MCHC 28.1 (L) 30.0 - 36.0 g/dL   RDW 20.2 (H) 11.5 - 15.5 %   Platelets 159 150 - 400 K/uL   nRBC 0.0 0.0 - 0.2 %    Comment: Performed at [facility], [place] [address], [place], [place] [address]  Type and screen [place]     Status: None (Preliminary result)   Collection Time: 02/27/21  6:19 PM  Result Value Ref Range   ABO/RH(D) O POS    Antibody Screen NEG    Sample Expiration 03/02/2021,2359    Unit Number [id number]    Blood Component Type RED CELLS,LR    Unit division 00    Status of Unit ISSUED,FINAL    Transfusion Status OK TO TRANSFUSE    Crossmatch Result Compatible    Unit Number [id number]    Blood Component Type RBC LR PHER2    Unit division 00    Status of Unit ISSUED    Transfusion Status OK TO TRANSFUSE    Crossmatch Result Compatible    Unit Number [id number]    Blood Component Type RED CELLS,LR    Unit division 00    Status of Unit ISSUED    Transfusion Status OK TO TRANSFUSE    Crossmatch Result       Compatible Performed at [facility] [address] [place], [place] [address]   Protime-INR     Status: Abnormal   Collection Time: 02/27/21  6:22 PM  Result Value Ref Range   Prothrombin Time 15.5 (H) 11.4 - 15.2 seconds   INR 1.2 0.8 - 1.2    Comment: (NOTE) INR goal varies based on device and disease states. Performed at [facility], [place] [address], [place], [place] [address]   Resp Panel by RT-PCR (Flu A&B, Covid) Nasopharyngeal Swab     Status: None   Collection Time: 02/27/21  7:51 PM   Specimen: Nasopharyngeal Swab; Nasopharyngeal(NP) swabs in vial transport medium  Result Value Ref Range   SARS Coronavirus 2 by RT PCR NEGATIVE NEGATIVE    Comment: (NOTE) SARS-CoV-2 target nucleic acids are NOT DETECTED.  The SARS-CoV-2 RNA is generally detectable in upper respiratory specimens during the acute phase of infection. The lowest concentration of SARS-CoV-2 viral copies this assay can detect is 138 copies/mL. A negative result does not preclude SARS-Cov-2 infection and should not be used as the sole basis for treatment or other patient management decisions. A negative result may occur with  improper specimen collection/handling, submission of specimen other than nasopharyngeal swab, presence of viral mutation(s) within the areas targeted by this assay, and inadequate number of viral copies(<138  copies/mL). A negative result must be combined with clinical observations, patient history, and epidemiological information. The expected result is Negative.  Fact Sheet for Patients:  EntrepreneurPulse.com.au  Fact Sheet for Healthcare Providers:  IncredibleEmployment.be  This test is no t yet approved or cleared by the [place] FDA and  has been authorized for detection and/or diagnosis of SARS-CoV-2 by FDA under an Emergency Use Authorization (EUA). This EUA will remain  in effect (meaning  this test can be used) for the duration of the COVID-19 declaration under Section 564(b)(1) of the Act, 21 U.S.C.section 360bbb-3(b)(1), unless the authorization is terminated  or revoked sooner.       Influenza A by PCR NEGATIVE NEGATIVE   Influenza B by PCR NEGATIVE NEGATIVE    Comment: (NOTE) The Xpert Xpress SARS-CoV-2/FLU/RSV plus assay is intended as an aid in the diagnosis of influenza from Nasopharyngeal swab specimens and should not be used as a sole basis for treatment. Nasal washings and aspirates are unacceptable for Xpert Xpress SARS-CoV-2/FLU/RSV testing.  Fact Sheet for Patients: EntrepreneurPulse.com.au  Fact Sheet for Healthcare Providers: IncredibleEmployment.be  This test is not yet approved or cleared by the [place] FDA and has been authorized for detection and/or diagnosis of SARS-CoV-2 by FDA under an Emergency Use Authorization (EUA). This EUA will remain in effect (meaning this test can be used) for the duration of the COVID-19 declaration under Section 564(b)(1) of the Act, 21 U.S.C. section 360bbb-3(b)(1), unless the authorization is terminated or revoked.  Performed at [facility], [place] [address], [place], [place] [address]   POC occult blood, ED     Status: Abnormal   Collection Time: 02/27/21  8:22 PM  Result Value Ref Range   Fecal Occult Bld POSITIVE (A) NEGATIVE  Prepare RBC (crossmatch)     Status: None   Collection Time: 02/27/21  8:30 PM  Result Value Ref Range   Order Confirmation      ORDER PROCESSED BY BLOOD BANK Performed at [facility] [address] [place], [place] [address]   CBC     Status: Abnormal   Collection Time: 02/28/21  4:39 AM  Result Value Ref Range   WBC 2.7 (L) 4.0 - 10.5 K/uL   RBC 2.97 (L) 4.22 - 5.81 MIL/uL   Hemoglobin 7.0 (L) 13.0 - 17.0 g/dL   HCT 23.6 (L) 39.0 - 52.0 %   MCV 79.5 (L) 80.0 - 100.0 fL   MCH 23.6 (L) 26.0 -  34.0 pg   MCHC 29.7 (L) 30.0 - 36.0 g/dL   RDW 19.6 (H) 11.5 - 15.5 %   Platelets 130 (L) 150 - 400 K/uL   nRBC 0.0 0.0 - 0.2 %    Comment: Performed at [facility], [place] [address] [place], [place] [address]  Prepare RBC (crossmatch)     Status: None   Collection Time: 02/28/21  7:38 AM  Result Value Ref Range   Order Confirmation      ORDER PROCESSED BY BLOOD BANK Performed at [facility], [place] [address] [place], [place] [address]   Glucose, capillary     Status: Abnormal   Collection Time: 02/28/21 10:19 AM  Result Value Ref Range   Glucose-Capillary 103 (H) 70 - 99 mg/dL    Comment: Glucose reference range applies only to samples taken after fasting for at least 8 hours.   Comment 1 Notify RN    Comment 2 Document in Chart   Glucose, capillary  Status: None   Collection Time: 02/28/21 11:50 AM  Result Value Ref Range   Glucose-Capillary 85 70 - 99 mg/dL    Comment: Glucose reference range applies only to samples taken after fasting for at least 8 hours.   Comment 1 Notify RN    Comment 2 Document in Chart     No results found.  Review of Systems negative except above he says he has been taking his lactulose Blood pressure (!) 143/56, pulse 67, temperature 97.7 F (36.5 C), temperature source Oral, resp. rate 15, height 5\' 8"  (1.727 m), weight 111.1 kg, SpO2 98 %. Physical Exam vital signs stable afebrile answering questions a little slowly otherwise exam please see preassessment evaluation hemoglobin 7 after transfusion was 7.7 when he was discharged BUN and creatinine okay liver tests actually improved INR okay CT a month ago reviewed  Assessment/Plan: Recurrent guaiac positive anemia in patient with multiple medical problems Plan: We will proceed with an endoscopy to reevaluate his varices with further work-up and plans pending those findings and he might need erythropoietin or IV iron per hematology in an effort to keep his  hemoglobin up from his presumed chronic oozing  Aftan Vint E 02/28/2021, 1:12 PM

## 2021-02-28 NOTE — Anesthesia Postprocedure Evaluation (Signed)
Anesthesia Post Note  Patient: [name]  Procedure(s) Performed: ESOPHAGOGASTRODUODENOSCOPY (EGD) WITH PROPOFOL     Patient location during evaluation: PACU Anesthesia Type: MAC Level of consciousness: awake and alert Pain management: pain level controlled Vital Signs Assessment: post-procedure vital signs reviewed and stable Respiratory status: spontaneous breathing, nonlabored ventilation and respiratory function stable Cardiovascular status: blood pressure returned to baseline and stable Postop Assessment: no apparent nausea or vomiting Anesthetic complications: no   No notable events documented.  Last Vitals:  Vitals:   02/28/21 1500 02/28/21 1600  BP: (!) 131/47   Pulse: 72   Resp: (!) 25   Temp:  (!) 36.4 C  SpO2: 98%     Last Pain:  Vitals:   02/28/21 1600  TempSrc: Oral  PainSc:                  Lidia Collum

## 2021-02-28 NOTE — Plan of Care (Signed)
  Problem: Education: Goal: Knowledge of General Education information will improve Description: Including pain rating scale, medication(s)/side effects and non-pharmacologic comfort measures Outcome: Progressing   Problem: Health Behavior/Discharge Planning: Goal: Ability to manage health-related needs will improve Outcome: Progressing   Problem: Clinical Measurements: Goal: Diagnostic test results will improve Outcome: Progressing   Problem: Nutrition: Goal: Adequate nutrition will be maintained Outcome: Progressing   Problem: Elimination: Goal: Will not experience complications related to bowel motility Outcome: Progressing Goal: Will not experience complications related to urinary retention Outcome: Progressing   Problem: Safety: Goal: Ability to remain free from injury will improve Outcome: Progressing   Problem: Education: Goal: Ability to identify signs and symptoms of gastrointestinal bleeding will improve Outcome: Progressing   Problem: Bowel/Gastric: Goal: Will show no signs and symptoms of gastrointestinal bleeding Outcome: Progressing   Problem: Fluid Volume: Goal: Will show no signs and symptoms of excessive bleeding Outcome: Progressing   Problem: Clinical Measurements: Goal: Complications related to the disease process, condition or treatment will be avoided or minimized Outcome: Progressing

## 2021-02-28 NOTE — Anesthesia Procedure Notes (Signed)
Procedure Name: MAC Date/Time: 02/28/2021 1:15 PM Performed by: Cynda Familia, CRNA Pre-anesthesia Checklist: Patient identified, Emergency Drugs available, Suction available, Patient being monitored and Timeout performed Patient Re-evaluated:Patient Re-evaluated prior to induction Oxygen Delivery Method: Simple face mask Placement Confirmation: positive ETCO2 and breath sounds checked- equal and bilateral Dental Injury: Teeth and Oropharynx as per pre-operative assessment

## 2021-02-28 NOTE — Anesthesia Preprocedure Evaluation (Signed)
Anesthesia Evaluation  Patient identified by MRN, date of birth, ID band Patient awake    Reviewed: Allergy & Precautions, NPO status , Patient's Chart, lab work & pertinent test results  Airway Mallampati: III  TM Distance: >3 FB Neck ROM: Full  Mouth opening: Limited Mouth Opening  Dental  (+) Edentulous Upper, Edentulous Lower   Pulmonary asthma , sleep apnea (does not use CPAP) , former smoker,    Pulmonary exam normal        Cardiovascular hypertension, + CAD, + Cardiac Stents (2013), + CABG (CABGx3 2015 LIMA to the LAD, SVG to the OM, SVG to the PDA) and +CHF (pEF)  Normal cardiovascular exam  TTE 2022 1. Left ventricular ejection fraction, by estimation, is 60 to 65%. The  left ventricle has normal function. The left ventricle has no regional  wall motion abnormalities. There is moderate concentric left ventricular  hypertrophy. Left ventricular  diastolic parameters are consistent with Grade III diastolic dysfunction  (restrictive).  2. Right ventricular systolic function is low normal. The right  ventricular size is normal. There is mildly elevated pulmonary artery  systolic pressure.  3. The mitral valve is normal in structure. No evidence of mitral valve  regurgitation. The mean mitral valve gradient is 4.0 mmHg with average  heart rate of 56 bpm.  4. The aortic valve is tricuspid. There is moderate calcification of the  aortic valve. Aortic valve regurgitation is mild.  5. The inferior vena cava is dilated in size with <50% respiratory  variability, suggesting right atrial pressure of 15 mmHg.    Neuro/Psych negative neurological ROS  negative psych ROS   GI/Hepatic PUD, GERD  Medicated and Controlled,(+) Cirrhosis   Esophageal Varices    , Portal hypertension, H/o hepatocellular carcinoma, h/o gastric varices and GIB; s/p TIPS 12/2020   Endo/Other  negative endocrine ROSdiabetes, Type 2, Oral  Hypoglycemic Agents  Renal/GU Renal InsufficiencyRenal disease (CKD stage III)  negative genitourinary   Musculoskeletal negative musculoskeletal ROS (+)   Abdominal   Peds  Hematology  (+) Blood dyscrasia, anemia ,   Anesthesia Other Findings  Pt with hepatocellular carcinoma (treated 2021), cirrhosis with portal HTN and esophageal varices, s/p TIPS in May. Presents with low Hgb discovered on outpatient labs. Hgb 6.0 on 6/30 and 5.9 on 7/2. S/p 3U PRBC. Hemodynamically stable  PMH: asthma, OSA, CAD s/p CABG, HFpEF (g2dd), HTN, HLD, CKDIIIa, T2DM well controlled  Reproductive/Obstetrics                             Anesthesia Physical  Anesthesia Plan  ASA: 3  Anesthesia Plan: MAC   Post-op Pain Management:    Induction: Intravenous  PONV Risk Score and Plan: 1 and Propofol infusion, TIVA and Treatment may vary due to age or medical condition  Airway Management Planned: Natural Airway, Nasal Cannula and Simple Face Mask  Additional Equipment: None  Intra-op Plan:   Post-operative Plan:   Informed Consent: I have reviewed the patients History and Physical, chart, labs and discussed the procedure including the risks, benefits and alternatives for the proposed anesthesia with the patient or authorized representative who has indicated his/her understanding and acceptance.       Plan Discussed with:   Anesthesia Plan Comments:        Anesthesia Quick Evaluation

## 2021-03-01 LAB — MAGNESIUM: Magnesium: 1.9 mg/dL (ref 1.7–2.4)

## 2021-03-01 LAB — CBC WITH DIFFERENTIAL/PLATELET
Abs Immature Granulocytes: 0.01 10*3/uL (ref 0.00–0.07)
Basophils Absolute: 0 10*3/uL (ref 0.0–0.1)
Basophils Relative: 1 %
Eosinophils Absolute: 0.1 10*3/uL (ref 0.0–0.5)
Eosinophils Relative: 4 %
HCT: 28.9 % — ABNORMAL LOW (ref 39.0–52.0)
Hemoglobin: 8.2 g/dL — ABNORMAL LOW (ref 13.0–17.0)
Immature Granulocytes: 0 %
Lymphocytes Relative: 12 %
Lymphs Abs: 0.4 10*3/uL — ABNORMAL LOW (ref 0.7–4.0)
MCH: 23.6 pg — ABNORMAL LOW (ref 26.0–34.0)
MCHC: 28.4 g/dL — ABNORMAL LOW (ref 30.0–36.0)
MCV: 83 fL (ref 80.0–100.0)
Monocytes Absolute: 0.3 10*3/uL (ref 0.1–1.0)
Monocytes Relative: 9 %
Neutro Abs: 2.4 10*3/uL (ref 1.7–7.7)
Neutrophils Relative %: 74 %
Platelets: 137 10*3/uL — ABNORMAL LOW (ref 150–400)
RBC: 3.48 MIL/uL — ABNORMAL LOW (ref 4.22–5.81)
RDW: 19.4 % — ABNORMAL HIGH (ref 11.5–15.5)
WBC: 3.1 10*3/uL — ABNORMAL LOW (ref 4.0–10.5)
nRBC: 0 % (ref 0.0–0.2)

## 2021-03-01 LAB — BPAM RBC
Blood Product Expiration Date: 202207302359
Blood Product Expiration Date: 202208032359
Blood Product Expiration Date: 202208032359
ISSUE DATE / TIME: 202207022053
ISSUE DATE / TIME: 202207030047
ISSUE DATE / TIME: 202207031038
Unit Type and Rh: 5100
Unit Type and Rh: 5100
Unit Type and Rh: 5100

## 2021-03-01 LAB — GLUCOSE, CAPILLARY
Glucose-Capillary: 101 mg/dL — ABNORMAL HIGH (ref 70–99)
Glucose-Capillary: 68 mg/dL — ABNORMAL LOW (ref 70–99)
Glucose-Capillary: 69 mg/dL — ABNORMAL LOW (ref 70–99)
Glucose-Capillary: 125 mg/dL — ABNORMAL HIGH (ref 70–99)
Glucose-Capillary: 64 mg/dL — ABNORMAL LOW (ref 70–99)
Glucose-Capillary: 96 mg/dL (ref 70–99)

## 2021-03-01 LAB — TYPE AND SCREEN
ABO/RH(D): O POS
Antibody Screen: NEGATIVE
Unit division: 0
Unit division: 0
Unit division: 0

## 2021-03-01 LAB — COMPREHENSIVE METABOLIC PANEL
ALT: 22 U/L (ref 0–44)
AST: 35 U/L (ref 15–41)
Albumin: 2.7 g/dL — ABNORMAL LOW (ref 3.5–5.0)
Alkaline Phosphatase: 157 U/L — ABNORMAL HIGH (ref 38–126)
Anion gap: 8 (ref 5–15)
BUN: 13 mg/dL (ref 8–23)
CO2: 21 mmol/L — ABNORMAL LOW (ref 22–32)
Calcium: 7.7 mg/dL — ABNORMAL LOW (ref 8.9–10.3)
Chloride: 109 mmol/L (ref 98–111)
Creatinine, Ser: 1 mg/dL (ref 0.61–1.24)
GFR, Estimated: >60 mL/min (ref >60)
Glucose, Bld: 77 mg/dL (ref 70–99)
Potassium: 3.7 mmol/L (ref 3.5–5.1)
Sodium: 138 mmol/L (ref 135–145)
Total Bilirubin: 1.5 mg/dL — ABNORMAL HIGH (ref 0.3–1.2)
Total Protein: 5.9 g/dL — ABNORMAL LOW (ref 6.5–8.1)

## 2021-03-01 MED ORDER — ALBUTEROL SULFATE HFA 108 (90 BASE) MCG/ACT IN AERS: 2.0000 | INHALATION_SPRAY | Freq: Four times a day (QID) | RESPIRATORY_TRACT | 0 refills | Status: DC | PRN

## 2021-03-01 MED ORDER — DEXTROSE 50 % IV SOLN
INTRAVENOUS | Status: AC
  Administered 2021-03-01: 12.5 g via INTRAVENOUS
  Filled 2021-03-01: qty 50

## 2021-03-01 MED ORDER — PANTOPRAZOLE SODIUM 40 MG PO TBEC
40.0000 mg | DELAYED_RELEASE_TABLET | Freq: Every day | ORAL | Status: DC
Start: 2021-03-01 — End: 2021-03-31

## 2021-03-01 MED ORDER — DEXTROSE 50 % IV SOLN: 12.5000 g | INTRAVENOUS | Status: AC

## 2021-03-01 NOTE — Discharge Summary (Signed)
Physician Discharge Summary  [name] TDD:[id number] DOB: [birth year] DOA: 02/27/2021  PCP: [name], MD  Admit date: 02/27/2021 Discharge date: 03/01/2021  Admitted From: Home Disposition: Home  Recommendations for Outpatient Follow-up:  Follow up with PCP in 1 week with repeat CBC/CMP Outpatient follow-up with GI and oncology. Follow up in ED if symptoms worsen or new appear   Home Health: No Equipment/Devices: None  Discharge Condition: Stable CODE STATUS: Full Diet recommendation: Heart healthy/carb modified  Brief/Interim Summary: 75 y.o. male with medical history significant for  past medical history significant for hepatocellular carcinoma, hepatic cirrhosis with portal HTN and esophageal varices, multiple recent hospitalizations for GI bleeding with history of TIPS and embolization of gastric varices by IR on 01/13/2021, hepatic encephalopathy, CABG, CKD stage IIIa, PUD, gout, HLD, IDA, OSA not on CPAP, type 2 diabetes mellitus, essential hypertension, HFpEF (EF of 60-65% with grade 2 diastolic [id number]) , osteoarthritis, chronic low back pain who presented for low hemoglobin at the recommendation of PCP.  Patient had bright red blood with bowel movement few days ago prior to presentation.  On presentation, hemoglobin was 5.9.  He was transfused 3 units packed red cells during the hospitalization.  GI was consulted.  He had had EGD done on 02/28/2021.  Subsequently he is tolerating diet and hemoglobin is stable.  GI has cleared the patient for discharge.  Outpatient follow-up with PCP/GI and oncology.  Discharge Diagnoses:   GI bleeding in a patient with history of multiple admissions for GI bleeding in the setting of portal hypertension and esophageal varices Recurrent symptomatic anemia -History of TIPS with embolization of gastric varices by IR on 01/13/2021.  Hemoglobin 5.9 on presentation.  -He was started on Protonix and Sandostatin drips.  -He was  transfused 3 units packed red cells during the hospitalization.  GI was consulted.  He had had EGD done on 02/28/2021 which showed gastric varices without bleeding, portal hypertensive gastropathy and chronic gastritis.  Subsequently Protonix was changed to 40 mg daily and octreotide drip was discontinued.   -Subsequently he is tolerating diet and hemoglobin is stable.  GI has cleared the patient for discharge.  GI recommends outpatient oncology follow-up to see if he needs erythropoietin or IV iron periodically.  Outpatient follow-up with PCP/GI and oncology.  -Discharge patient home today on Protonix 40 mg daily.   Decompensated cirrhosis of liver with portal hypertension, esophageal varices and history of hypertension cephalopathy - Continue Lasix.  Fluid restriction.  Continue lactulose.  Mental status stable.  Chronic diastolic heart failure -Continue oral Lasix.  Outpatient follow-up with cardiology.    CKD stage IIIa -Creatinine stable.  Hypokalemia -Improved.  CAD status post CABG -Stable.  Not on antiplatelets given recurrent anemia/GI bleed.  Outpatient follow-up with cardiology  Diabetes mellitus type 2 with hypoglycemia -A1c 5.4 on 01/07/2021.  Carb modified diet.  Continue metformin.  Hold glipizide on discharge because of hypoglycemia episodes in the hospital.  Outpatient follow-up with PCP.  Hypertension -blood pressure stable.  Continue Lasix.  Asthma -currently stable.  Outpatient follow-up.   Discharge Instructions  Discharge Instructions     Diet - low sodium heart healthy   Complete by: As directed    Diet Carb Modified   Complete by: As directed    Increase activity slowly   Complete by: As directed       Allergies as of 03/01/2021       Reactions   Fluzone Quadrivalent [influenza Vac Split Quad] Other (See Comments)  The patient stated, in 10/2020: "I am not taking any more flu shots. It liked to have killed me."        Medication List     STOP  taking these medications    glimepiride 2 MG tablet Commonly known as: AMARYL   hydrocortisone 2.5 % rectal cream Commonly known as: Anusol-HC       TAKE these medications    Accu-Chek Softclix Lancets lancets USE AS DIRECTED TO TEST BLOOD SUGAR FOUR TIMES DAILY   albuterol 108 (90 Base) MCG/ACT inhaler Commonly known as: VENTOLIN HFA Inhale 2 puffs into the lungs every 6 (six) hours as needed for wheezing or shortness of breath.   Anoro Ellipta 62.5-25 MCG/INH Aepb Generic drug: umeclidinium-vilanterol Inhale 1 puff into the lungs daily.   blood glucose meter kit and supplies Dispense based on patient and insurance preference. Use up to four times daily as directed. (FOR ICD-10 E10.9, E11.9).   furosemide 40 MG tablet Commonly known as: LASIX Take 1 tablet (40 mg total) by mouth daily.   lactulose 10 GM/15ML solution Commonly known as: CHRONULAC Take 30 mLs (20 g total) by mouth 3 (three) times daily.   metFORMIN 850 MG tablet Commonly known as: GLUCOPHAGE TAKE 1 TABLET(850 MG) BY MOUTH TWICE DAILY WITH A MEAL What changed: See the new instructions.   nitroGLYCERIN 0.4 MG SL tablet Commonly known as: NITROSTAT Place 1 tablet (0.4 mg total) under the tongue every 5 (five) minutes as needed for chest pain.   pantoprazole 40 MG tablet Commonly known as: PROTONIX Take 1 tablet (40 mg total) by mouth daily. What changed: when to take this        Follow-up Information     [name], MD. Schedule an appointment as soon as possible for a visit in 1 week(s).   Specialty: Internal Medicine Why: with cbc/cmp Contact information: [place] [address] [phone]         Nahser, Wonda Cheng, MD .   Specialty: Cardiology Contact information: [place] [address] [place] [address] [phone]         Clarene Essex, MD. Schedule an appointment as soon as possible for a visit in 1 week(s).   Specialty:  Gastroenterology Contact information: [address] [place] [address] [phone]                Allergies  Allergen Reactions   Fluzone Quadrivalent [Influenza Vac Split Quad] Other (See Comments)    The patient stated, in 10/2020: "I am not taking any more flu shots. It liked to have killed me."    Consultations: GI   Procedures/Studies: EGD on 02/28/2021 Impression:               - Normal larynx.                           - Tiny hiatal hernia.                           - Gastric varices, without bleeding.                           - Portal hypertensive gastropathy.                           - Normal  duodenal bulb, first portion of the                           duodenum, second portion of the duodenum and third                           portion of the duodenum.                           - The examination was otherwise normal.                           - Chronic gastritis.                           - No specimens collected. Moderate Sedation:      Not Applicable - Patient had care per Anesthesia. Recommendation:           - Soft diet today. Doubt he needs any further                           inpatient GI evaluation at this time                           - Continue present medications. Okay to wean                           octreotide per pharmacy routine                           - Return to GI clinic PRN. Will need referral back                           to oncology for follow-up as well as interventional                           radiology follow-up per their routine and consider                           asking oncology to see if he needs erythropoietin                           or IV iron periodically to assist with probable                           chronic GI blood loss                           - Telephone GI clinic if symptomatic PRN.    Subjective: Patient seen and examined at bedside.  Denies any overnight fever, vomiting, or black or  bloody stools.  Feels okay to go home today.  Discharge Exam: Vitals:   03/01/21 0300 03/01/21 0400  BP: (!) 150/65 (!) 141/37  Pulse: 62 69  Resp: 19 20  Temp:    SpO2: 96% 97%    General: Pt is alert, awake, not in acute distress.  Currently  on room air.  Poor historian. Cardiovascular: rate controlled, S1/S2 + Respiratory: bilateral decreased breath sounds at bases Abdominal: Soft, NT, ND, bowel sounds + Extremities: Trace lower extremity edema, no cyanosis    The results of significant diagnostics from this hospitalization (including imaging, microbiology, ancillary and laboratory) are listed below for reference.     Microbiology: Recent Results (from the past 240 hour(s))  Resp Panel by RT-PCR (Flu A&B, Covid) Nasopharyngeal Swab     Status: None   Collection Time: 02/27/21  7:51 PM   Specimen: Nasopharyngeal Swab; Nasopharyngeal(NP) swabs in vial transport medium  Result Value Ref Range Status   SARS Coronavirus 2 by RT PCR NEGATIVE NEGATIVE Final    Comment: (NOTE) SARS-CoV-2 target nucleic acids are NOT DETECTED.  The SARS-CoV-2 RNA is generally detectable in upper respiratory specimens during the acute phase of infection. The lowest concentration of SARS-CoV-2 viral copies this assay can detect is 138 copies/mL. A negative result does not preclude SARS-Cov-2 infection and should not be used as the sole basis for treatment or other patient management decisions. A negative result may occur with  improper specimen collection/handling, submission of specimen other than nasopharyngeal swab, presence of viral mutation(s) within the areas targeted by this assay, and inadequate number of viral copies(<138 copies/mL). A negative result must be combined with clinical observations, patient history, and epidemiological information. The expected result is Negative.  Fact Sheet for Patients:  EntrepreneurPulse.com.au  Fact Sheet for Healthcare Providers:   IncredibleEmployment.be  This test is no t yet approved or cleared by the [place] FDA and  has been authorized for detection and/or diagnosis of SARS-CoV-2 by FDA under an Emergency Use Authorization (EUA). This EUA will remain  in effect (meaning this test can be used) for the duration of the COVID-19 declaration under Section 564(b)(1) of the Act, 21 U.S.C.section 360bbb-3(b)(1), unless the authorization is terminated  or revoked sooner.       Influenza A by PCR NEGATIVE NEGATIVE Final   Influenza B by PCR NEGATIVE NEGATIVE Final    Comment: (NOTE) The Xpert Xpress SARS-CoV-2/FLU/RSV plus assay is intended as an aid in the diagnosis of influenza from Nasopharyngeal swab specimens and should not be used as a sole basis for treatment. Nasal washings and aspirates are unacceptable for Xpert Xpress SARS-CoV-2/FLU/RSV testing.  Fact Sheet for Patients: EntrepreneurPulse.com.au  Fact Sheet for Healthcare Providers: IncredibleEmployment.be  This test is not yet approved or cleared by the [place] FDA and has been authorized for detection and/or diagnosis of SARS-CoV-2 by FDA under an Emergency Use Authorization (EUA). This EUA will remain in effect (meaning this test can be used) for the duration of the COVID-19 declaration under Section 564(b)(1) of the Act, 21 U.S.C. section 360bbb-3(b)(1), unless the authorization is terminated or revoked.  Performed at [facility], [place] [address], [place], [place] [address]   MRSA Next Gen by PCR, Nasal     Status: None   Collection Time: 02/28/21 10:53 AM   Specimen: Nasal Mucosa; Nasal Swab  Result Value Ref Range Status   MRSA by PCR Next Gen NOT DETECTED NOT DETECTED Final    Comment: (NOTE) The GeneXpert MRSA Assay (FDA approved for NASAL specimens only), is one component of a comprehensive MRSA colonization surveillance program. It is not intended  to diagnose MRSA infection nor to guide or monitor treatment for MRSA infections. Test performance is not FDA approved in patients less than 29 years old. Performed at [facility], [address] [place], [place] [address]      Labs: BNP (last 3 results) Recent Labs    10/10/20 0333 01/06/21 1630 01/22/21 1609  BNP 432.1* 337.7* 502.7*   Basic Metabolic Panel: Recent Labs  Lab 02/27/21 1816 03/01/21 0322  NA 136 138  K 3.3* 3.7  CL 107 109  CO2 20* 21*  GLUCOSE 77 77  BUN 13 13  CREATININE 0.90 1.00  CALCIUM 8.0* 7.7*  MG  --  1.9   Liver Function Tests: Recent Labs  Lab 02/27/21 1816 03/01/21 0322  AST 35 35  ALT 26 22  ALKPHOS 197* 157*  BILITOT 1.1 1.5*  PROT 6.1* 5.9*  ALBUMIN 2.8* 2.7*   No results for input(s): LIPASE, AMYLASE in the last 168 hours. No results for input(s): AMMONIA in the last 168 hours. CBC: Recent Labs  Lab 02/25/21 1403 02/27/21 1816 02/28/21 0439 02/28/21 1637 03/01/21 0322  WBC 2.7* 2.9* 2.7*  --  3.1*  NEUTROABS 1.7  --   --   --  2.4  HGB 6.0 Repeated and verified X2.* 5.9* 7.0* 8.3* 8.2*  HCT 19.4 Repeated and verified X2.* 21.0* 23.6* 28.5* 28.9*  MCV 72.9* 78.4* 79.5*  --  83.0  PLT 143.0* 159 130*  --  137*   Cardiac Enzymes: No results for input(s): CKTOTAL, CKMB, CKMBINDEX, TROPONINI in the last 168 hours. BNP: Invalid input(s): POCBNP CBG: Recent Labs  Lab 03/01/21 0418 03/01/21 0420 03/01/21 0441 03/01/21 0519 03/01/21 0815  GLUCAP 69* 68* 64* 125* 96   D-Dimer No results for input(s): DDIMER in the last 72 hours. Hgb A1c No results for input(s): HGBA1C in the last 72 hours. Lipid Profile No results for input(s): CHOL, HDL, LDLCALC, TRIG, CHOLHDL, LDLDIRECT in the last 72 hours. Thyroid function studies No results for input(s): TSH, T4TOTAL, T3FREE, THYROIDAB in the last 72 hours.  Invalid input(s): FREET3 Anemia work up No results for input(s): VITAMINB12, FOLATE,  FERRITIN, TIBC, IRON, RETICCTPCT in the last 72 hours. Urinalysis    Component Value Date/Time   COLORURINE YELLOW 01/22/2021 1856   APPEARANCEUR CLEAR 01/22/2021 1856   LABSPEC 1.014 01/22/2021 1856   PHURINE 6.0 01/22/2021 1856   GLUCOSEU NEGATIVE 01/22/2021 1856   HGBUR NEGATIVE 01/22/2021 1856   BILIRUBINUR NEGATIVE 01/22/2021 1856   KETONESUR NEGATIVE 01/22/2021 1856   PROTEINUR 30 (A) 01/22/2021 1856   UROBILINOGEN 0.2 03/29/2014 1546   NITRITE NEGATIVE 01/22/2021 1856   LEUKOCYTESUR NEGATIVE 01/22/2021 1856   Sepsis Labs Invalid input(s): PROCALCITONIN,  WBC,  LACTICIDVEN Microbiology Recent Results (from the past 240 hour(s))  Resp Panel by RT-PCR (Flu A&B, Covid) Nasopharyngeal Swab     Status: None   Collection Time: 02/27/21  7:51 PM   Specimen: Nasopharyngeal Swab; Nasopharyngeal(NP) swabs in vial transport medium  Result Value Ref Range Status   SARS Coronavirus 2 by RT PCR NEGATIVE NEGATIVE Final    Comment: (NOTE) SARS-CoV-2 target nucleic acids are NOT DETECTED.  The SARS-CoV-2 RNA is generally detectable in upper respiratory specimens during the acute phase of infection. The lowest concentration of SARS-CoV-2 viral copies this assay can detect is 138 copies/mL. A negative result does not preclude SARS-Cov-2 infection and should not be used as the sole basis for treatment or other patient management decisions. A negative result may occur with  improper specimen collection/handling, submission of specimen other than nasopharyngeal swab, presence of viral mutation(s) within the areas targeted by this assay, and inadequate number of viral copies(<138 copies/mL). A negative result  must be combined with clinical observations, patient history, and epidemiological information. The expected result is Negative.  Fact Sheet for Patients:  EntrepreneurPulse.com.au  Fact Sheet for Healthcare Providers:   IncredibleEmployment.be  This test is no t yet approved or cleared by the [place] FDA and  has been authorized for detection and/or diagnosis of SARS-CoV-2 by FDA under an Emergency Use Authorization (EUA). This EUA will remain  in effect (meaning this test can be used) for the duration of the COVID-19 declaration under Section 564(b)(1) of the Act, 21 U.S.C.section 360bbb-3(b)(1), unless the authorization is terminated  or revoked sooner.       Influenza A by PCR NEGATIVE NEGATIVE Final   Influenza B by PCR NEGATIVE NEGATIVE Final    Comment: (NOTE) The Xpert Xpress SARS-CoV-2/FLU/RSV plus assay is intended as an aid in the diagnosis of influenza from Nasopharyngeal swab specimens and should not be used as a sole basis for treatment. Nasal washings and aspirates are unacceptable for Xpert Xpress SARS-CoV-2/FLU/RSV testing.  Fact Sheet for Patients: EntrepreneurPulse.com.au  Fact Sheet for Healthcare Providers: IncredibleEmployment.be  This test is not yet approved or cleared by the [place] FDA and has been authorized for detection and/or diagnosis of SARS-CoV-2 by FDA under an Emergency Use Authorization (EUA). This EUA will remain in effect (meaning this test can be used) for the duration of the COVID-19 declaration under Section 564(b)(1) of the Act, 21 U.S.C. section 360bbb-3(b)(1), unless the authorization is terminated or revoked.  Performed at [facility], [place] [address], [place], [place] [address]   MRSA Next Gen by PCR, Nasal     Status: None   Collection Time: 02/28/21 10:53 AM   Specimen: Nasal Mucosa; Nasal Swab  Result Value Ref Range Status   MRSA by PCR Next Gen NOT DETECTED NOT DETECTED Final    Comment: (NOTE) The GeneXpert MRSA Assay (FDA approved for NASAL specimens only), is one component of a comprehensive MRSA colonization surveillance program. It is not intended  to diagnose MRSA infection nor to guide or monitor treatment for MRSA infections. Test performance is not FDA approved in patients less than 20 years old. Performed at [facility], [place] [address], [place], [place] [address]      Time coordinating discharge: 35 minutes  SIGNED:   Aline August, MD  [facility] 03/01/2021, 9:51 AM

## 2021-03-01 NOTE — Progress Notes (Signed)
[name] 10:03 AM  Subjective: Patient doing well without signs of bleeding and eating well and not having any complaints  Objective: Vital signs stable afebrile no acute distress abdomen is soft nontender chemistries okay hemoglobin stable  Assessment: Chronic anemia and probable subacute GI blood loss  Plan: See yesterday's endoscopy report for recommendations and we are happy to see back as needed  [facility]  office [phone] After 5PM or if no answer call [phone]

## 2021-03-01 NOTE — Progress Notes (Signed)
Pt had low BS for 0400 check, 8oz of OJ given still reading low BS, D50 given and last BS now 125. Pt transferred to 4th floor via wheelchair with personal belongings. Report given, VSS

## 2021-03-02 ENCOUNTER — Encounter (HOSPITAL_COMMUNITY): Payer: Self-pay | Admitting: Gastroenterology

## 2021-03-02 LAB — GLUCOSE, CAPILLARY: Glucose-Capillary: 53 mg/dL — ABNORMAL LOW (ref 70–99)

## 2021-03-04 ENCOUNTER — Other Ambulatory Visit: Payer: Self-pay

## 2021-03-04 ENCOUNTER — Encounter (HOSPITAL_COMMUNITY): Payer: Self-pay | Admitting: Internal Medicine

## 2021-03-04 ENCOUNTER — Inpatient Hospital Stay (HOSPITAL_COMMUNITY)
Admission: EM | Admit: 2021-03-04 | Discharge: 2021-03-06 | DRG: 433 | Disposition: A | Discharge status: Home Health | Payer: Medicare HMO | Attending: Internal Medicine | Admitting: Internal Medicine

## 2021-03-04 DIAGNOSIS — E118 Type 2 diabetes mellitus with unspecified complications: Secondary | ICD-10-CM | POA: Diagnosis present

## 2021-03-04 DIAGNOSIS — R471 Dysarthria and anarthria: Secondary | ICD-10-CM | POA: Diagnosis present

## 2021-03-04 DIAGNOSIS — Z951 Presence of aortocoronary bypass graft: Secondary | ICD-10-CM

## 2021-03-04 DIAGNOSIS — G4733 Obstructive sleep apnea (adult) (pediatric): Secondary | ICD-10-CM | POA: Diagnosis present

## 2021-03-04 DIAGNOSIS — C220 Liver cell carcinoma: Secondary | ICD-10-CM | POA: Diagnosis present

## 2021-03-04 DIAGNOSIS — Z8249 Family history of ischemic heart disease and other diseases of the circulatory system: Secondary | ICD-10-CM

## 2021-03-04 DIAGNOSIS — K7682 Hepatic encephalopathy: Secondary | ICD-10-CM | POA: Diagnosis present

## 2021-03-04 DIAGNOSIS — K7290 Hepatic failure, unspecified without coma: Secondary | ICD-10-CM | POA: Diagnosis not present

## 2021-03-04 DIAGNOSIS — K219 Gastro-esophageal reflux disease without esophagitis: Secondary | ICD-10-CM | POA: Diagnosis present

## 2021-03-04 DIAGNOSIS — Z887 Allergy status to serum and vaccine status: Secondary | ICD-10-CM

## 2021-03-04 DIAGNOSIS — N1831 Chronic kidney disease, stage 3a: Secondary | ICD-10-CM | POA: Diagnosis present

## 2021-03-04 DIAGNOSIS — Z87820 Personal history of traumatic brain injury: Secondary | ICD-10-CM

## 2021-03-04 DIAGNOSIS — I5032 Chronic diastolic (congestive) heart failure: Secondary | ICD-10-CM | POA: Diagnosis present

## 2021-03-04 DIAGNOSIS — K7040 Alcoholic hepatic failure without coma: Principal | ICD-10-CM | POA: Diagnosis present

## 2021-03-04 DIAGNOSIS — E1122 Type 2 diabetes mellitus with diabetic chronic kidney disease: Secondary | ICD-10-CM | POA: Diagnosis present

## 2021-03-04 DIAGNOSIS — E785 Hyperlipidemia, unspecified: Secondary | ICD-10-CM | POA: Diagnosis present

## 2021-03-04 DIAGNOSIS — K766 Portal hypertension: Secondary | ICD-10-CM | POA: Diagnosis present

## 2021-03-04 DIAGNOSIS — E66811 Obesity, class 1: Secondary | ICD-10-CM

## 2021-03-04 DIAGNOSIS — Z713 Dietary counseling and surveillance: Secondary | ICD-10-CM

## 2021-03-04 DIAGNOSIS — I8510 Secondary esophageal varices without bleeding: Secondary | ICD-10-CM | POA: Diagnosis present

## 2021-03-04 DIAGNOSIS — M109 Gout, unspecified: Secondary | ICD-10-CM | POA: Diagnosis present

## 2021-03-04 DIAGNOSIS — R410 Disorientation, unspecified: Secondary | ICD-10-CM | POA: Diagnosis not present

## 2021-03-04 DIAGNOSIS — Z9049 Acquired absence of other specified parts of digestive tract: Secondary | ICD-10-CM

## 2021-03-04 DIAGNOSIS — Z7984 Long term (current) use of oral hypoglycemic drugs: Secondary | ICD-10-CM

## 2021-03-04 DIAGNOSIS — K7460 Unspecified cirrhosis of liver: Secondary | ICD-10-CM | POA: Diagnosis present

## 2021-03-04 DIAGNOSIS — R531 Weakness: Secondary | ICD-10-CM | POA: Diagnosis not present

## 2021-03-04 DIAGNOSIS — Z20822 Contact with and (suspected) exposure to covid-19: Secondary | ICD-10-CM | POA: Diagnosis present

## 2021-03-04 DIAGNOSIS — I890 Lymphedema, not elsewhere classified: Secondary | ICD-10-CM | POA: Diagnosis present

## 2021-03-04 DIAGNOSIS — Z87891 Personal history of nicotine dependence: Secondary | ICD-10-CM

## 2021-03-04 DIAGNOSIS — I5030 Unspecified diastolic (congestive) heart failure: Secondary | ICD-10-CM | POA: Diagnosis present

## 2021-03-04 DIAGNOSIS — R609 Edema, unspecified: Secondary | ICD-10-CM | POA: Diagnosis not present

## 2021-03-04 DIAGNOSIS — E669 Obesity, unspecified: Secondary | ICD-10-CM

## 2021-03-04 DIAGNOSIS — G8929 Other chronic pain: Secondary | ICD-10-CM | POA: Diagnosis present

## 2021-03-04 DIAGNOSIS — E6609 Other obesity due to excess calories: Secondary | ICD-10-CM | POA: Diagnosis present

## 2021-03-04 DIAGNOSIS — Z79899 Other long term (current) drug therapy: Secondary | ICD-10-CM

## 2021-03-04 DIAGNOSIS — I959 Hypotension, unspecified: Secondary | ICD-10-CM | POA: Diagnosis not present

## 2021-03-04 DIAGNOSIS — Z8505 Personal history of malignant neoplasm of liver: Secondary | ICD-10-CM

## 2021-03-04 DIAGNOSIS — D61818 Other pancytopenia: Secondary | ICD-10-CM

## 2021-03-04 DIAGNOSIS — J449 Chronic obstructive pulmonary disease, unspecified: Secondary | ICD-10-CM | POA: Diagnosis present

## 2021-03-04 DIAGNOSIS — Z6836 Body mass index (BMI) 36.0-36.9, adult: Secondary | ICD-10-CM

## 2021-03-04 DIAGNOSIS — N183 Chronic kidney disease, stage 3 unspecified: Secondary | ICD-10-CM | POA: Diagnosis present

## 2021-03-04 DIAGNOSIS — I2510 Atherosclerotic heart disease of native coronary artery without angina pectoris: Secondary | ICD-10-CM | POA: Diagnosis present

## 2021-03-04 DIAGNOSIS — I10 Essential (primary) hypertension: Secondary | ICD-10-CM | POA: Diagnosis not present

## 2021-03-04 DIAGNOSIS — K7030 Alcoholic cirrhosis of liver without ascites: Secondary | ICD-10-CM | POA: Diagnosis present

## 2021-03-04 DIAGNOSIS — Z955 Presence of coronary angioplasty implant and graft: Secondary | ICD-10-CM

## 2021-03-04 DIAGNOSIS — I130 Hypertensive heart and chronic kidney disease with heart failure and stage 1 through stage 4 chronic kidney disease, or unspecified chronic kidney disease: Secondary | ICD-10-CM | POA: Diagnosis present

## 2021-03-04 LAB — CBC
HCT: 27.6 % — ABNORMAL LOW (ref 39.0–52.0)
Hemoglobin: 7.9 g/dL — ABNORMAL LOW (ref 13.0–17.0)
MCH: 22.9 pg — ABNORMAL LOW (ref 26.0–34.0)
MCHC: 28.6 g/dL — ABNORMAL LOW (ref 30.0–36.0)
MCV: 80 fL (ref 80.0–100.0)
Platelets: 101 10*3/uL — ABNORMAL LOW (ref 150–400)
RBC: 3.45 MIL/uL — ABNORMAL LOW (ref 4.22–5.81)
RDW: 19.7 % — ABNORMAL HIGH (ref 11.5–15.5)
WBC: 1.8 10*3/uL — ABNORMAL LOW (ref 4.0–10.5)
nRBC: 0 % (ref 0.0–0.2)

## 2021-03-04 LAB — COMPREHENSIVE METABOLIC PANEL
ALT: 22 U/L (ref 0–44)
AST: 35 U/L (ref 15–41)
Albumin: 2.8 g/dL — ABNORMAL LOW (ref 3.5–5.0)
Alkaline Phosphatase: 139 U/L — ABNORMAL HIGH (ref 38–126)
Anion gap: 7 (ref 5–15)
BUN: 12 mg/dL (ref 8–23)
CO2: 22 mmol/L (ref 22–32)
Calcium: 8.4 mg/dL — ABNORMAL LOW (ref 8.9–10.3)
Chloride: 111 mmol/L (ref 98–111)
Creatinine, Ser: 1 mg/dL (ref 0.61–1.24)
GFR, Estimated: >60 mL/min (ref >60)
Glucose, Bld: 105 mg/dL — ABNORMAL HIGH (ref 70–99)
Potassium: 3.5 mmol/L (ref 3.5–5.1)
Sodium: 140 mmol/L (ref 135–145)
Total Bilirubin: 1.9 mg/dL — ABNORMAL HIGH (ref 0.3–1.2)
Total Protein: 6.1 g/dL — ABNORMAL LOW (ref 6.5–8.1)

## 2021-03-04 LAB — RESP PANEL BY RT-PCR (FLU A&B, COVID) ARPGX2
Influenza A by PCR: NEGATIVE
Influenza B by PCR: NEGATIVE
SARS Coronavirus 2 by RT PCR: NEGATIVE

## 2021-03-04 LAB — PROTIME-INR
INR: 1.4 — ABNORMAL HIGH (ref 0.8–1.2)
Prothrombin Time: 16.9 seconds — ABNORMAL HIGH (ref 11.4–15.2)

## 2021-03-04 LAB — CBG MONITORING, ED
Glucose-Capillary: 132 mg/dL — ABNORMAL HIGH (ref 70–99)
Glucose-Capillary: 96 mg/dL (ref 70–99)

## 2021-03-04 LAB — AMMONIA: Ammonia: 107 umol/L — ABNORMAL HIGH (ref 9–35)

## 2021-03-04 LAB — LACTIC ACID, PLASMA: Lactic Acid, Venous: 1.7 mmol/L (ref 0.5–1.9)

## 2021-03-04 LAB — BRAIN NATRIURETIC PEPTIDE: B Natriuretic Peptide: 432.3 pg/mL — ABNORMAL HIGH (ref 0.0–100.0)

## 2021-03-04 MED ORDER — LACTATED RINGERS IV SOLN: INTRAVENOUS | Status: DC

## 2021-03-04 MED ORDER — ONDANSETRON HCL 4 MG PO TABS: 4.0000 mg | ORAL_TABLET | Freq: Four times a day (QID) | ORAL | Status: DC | PRN

## 2021-03-04 MED ORDER — UMECLIDINIUM-VILANTEROL 62.5-25 MCG/INH IN AEPB
1.0000 | INHALATION_SPRAY | Freq: Every day | RESPIRATORY_TRACT | Status: DC
  Administered 2021-03-06: 1 via RESPIRATORY_TRACT
  Filled 2021-03-04: qty 14

## 2021-03-04 MED ORDER — INSULIN ASPART 100 UNIT/ML IJ SOLN
0.0000 [IU] | Freq: Three times a day (TID) | INTRAMUSCULAR | Status: DC
  Administered 2021-03-05 (×2): 1 [IU] via SUBCUTANEOUS

## 2021-03-04 MED ORDER — PANTOPRAZOLE SODIUM 40 MG PO TBEC
40.0000 mg | DELAYED_RELEASE_TABLET | Freq: Every day | ORAL | Status: DC
  Administered 2021-03-05 – 2021-03-06 (×2): 40 mg via ORAL
  Filled 2021-03-04 (×2): qty 1

## 2021-03-04 MED ORDER — HYDRALAZINE HCL 20 MG/ML IJ SOLN: 5.0000 mg | INTRAMUSCULAR | Status: DC | PRN

## 2021-03-04 MED ORDER — LACTULOSE 10 GM/15ML PO SOLN: 10.0000 g | Freq: Once | ORAL | Status: DC

## 2021-03-04 MED ORDER — LACTULOSE ENEMA
300.0000 mL | ORAL | Status: DC | PRN
  Filled 2021-03-04 (×2): qty 300

## 2021-03-04 MED ORDER — ACETAMINOPHEN 650 MG RE SUPP: 650.0000 mg | Freq: Four times a day (QID) | RECTAL | Status: DC | PRN

## 2021-03-04 MED ORDER — ONDANSETRON HCL 4 MG/2ML IJ SOLN: 4.0000 mg | Freq: Four times a day (QID) | INTRAMUSCULAR | Status: DC | PRN

## 2021-03-04 MED ORDER — ALBUTEROL SULFATE (2.5 MG/3ML) 0.083% IN NEBU: 2.5000 mg | INHALATION_SOLUTION | Freq: Four times a day (QID) | RESPIRATORY_TRACT | Status: DC | PRN

## 2021-03-04 MED ORDER — ACETAMINOPHEN 325 MG PO TABS: 650.0000 mg | ORAL_TABLET | Freq: Four times a day (QID) | ORAL | Status: DC | PRN

## 2021-03-04 MED ORDER — SODIUM CHLORIDE 0.9% FLUSH
3.0000 mL | Freq: Two times a day (BID) | INTRAVENOUS | Status: DC
  Administered 2021-03-04 – 2021-03-06 (×4): 3 mL via INTRAVENOUS

## 2021-03-04 NOTE — H&P (Signed)
History and Physical    [name] WEX:[id number] DOB: 03-12-1946 DOA: 03/04/2021  PCP: [name], MD Consultants:  Penelope Coop - GI; Nahser - cardiology; Risa Grill - urology; [place] - pulmonology Patient coming from:  Home - lives with wife and son; NOK: [place], [phone]  Chief Complaint: AMS  HPI: [name] is a 75 y.o. male with medical history significant of cirrhosis with varices, encephalopathy, and hepatocellular CA; CAD s/p CABG; multiple recent hospitalizations for GI bleeding s/p TIPS and embolization of gastric varices by IR on 2/58/52; chronic diastolic CHF; HLD; HTN; DM; stage 3a CKD; and OSA not on CPAP presenting with AMS.  He was previously admitted from 7/2-4 with recurrent GI bleeding with EGD showing non-bleeding gastric varices, portal hypertensive gastropathy, and chronic gastritis.  He is alert but somewhat confused - was standing in the doorway when I walked in the room because he needed to void.  He was given a urinal and fell asleep while standing to void at the bedside, urinating on the floor in addition to in the urinal.  However, after getting back into the bed, he was able to answer most questions.  He reported that his son made him come in because he was "weebly".    His son noted some confusion at home.  He was trying to use a candy bar as the remote control.  He noticed this yesterday afternoon.  He is unsure if he has been taking his lactulose - that might be some of the problem.  He doesn't want to take the medicine because it makes it go to the bathroom too often.  Palliative care has been involved and has been somewhat helpful; the nurse was supposed to come out tomorrow.    ED Course: D/C for GIB on 7/4.  Not acting right, confused.  Has pancytopenia, NH4 107.  Likely needs admission for hepatic encephalopathy and pancytopenia.  Review of Systems: As per HPI; otherwise review of systems reviewed and negative.   Ambulatory Status:   Ambulates without assistance   Past Medical History:  Diagnosis Date   Arthritis    "joints tighten up sometimes" (03/27/2104)   Carcinoma of liver, hepatocellular ([place]) 06/30/2020   Chronic lower back pain    Coronary artery disease involving native coronary artery of native heart without angina pectoris    Severe left main disease at catheterization July 2015  CABG x3 with a LIMA to the LAD, SVG to the OM, SVG to the PDA on 03/31/14. EF 60% by cath.    Diastolic heart failure (HCC)    GERD without esophagitis 08/04/2010   Hepatic cirrhosis ([place])    a. Dx 01/2014 - CT a/p    History of blood transfusion    "related to bleeding ulcers"   History of concussion    1976--  NO RESIDUAL   History of GI bleed    a. UGIB 07/2012;  b. 01/2014 admission with GIB/FOB stool req 1U prbc's->EGD showed portal gastropathy, barrett's esoph, and chronic active h. pylori gastritis.   History of gout    2007 &  2008  LEFT LEG-- NO ISSUE SINCE   Hyperlipidemia    Iron deficiency anemia    Kidney stones    OA (osteoarthritis of spine)    LOWER BACK--  INTERMITTANT LEFT LEG NUMBNESS   OSA (obstructive sleep apnea)    PULMOLOGIST-  DR CLANCE--  MODERATE OSA  STARTED CPAP 2012--  BUT CURRENTLY HAS NOT USED PAST 6 MONTHS  Phimosis    a. s/p circumcision 2015.   Type 2 diabetes mellitus ([place])    Unspecified essential hypertension     Past Surgical History:  Procedure Laterality Date   ANKLE FRACTURE SURGERY Right 1989   "plate put in"   APPENDECTOMY  05-16-2004   open   BIOPSY  01/08/2021   Procedure: BIOPSY;  Surgeon: Wilford Corner, MD;  Location: [facility];  Service: Endoscopy;;   CIRCUMCISION N/A 09/09/2013   Procedure: CIRCUMCISION ADULT;  Surgeon: Bernestine Amass, MD;  Location: [facility];  Service: Urology;  Laterality: N/A;   COLECTOMY  05-16-2004   COLONOSCOPY WITH PROPOFOL N/A 11/20/2020   Procedure: COLONOSCOPY WITH PROPOFOL;  Surgeon: Ronald Lobo, MD;  Location:  [facility];  Service: Endoscopy;  Laterality: N/A;   CORONARY ANGIOPLASTY WITH STENT PLACEMENT  06/28/2012  DR COOPER   PCI W/  X1 DES to [place]. LAD/  LM  40% OSTIAL & 50-60% DISTAL /  50% PROX LCX/  30-40% PROX RCA & 50% MID RCA/   LVEF 65-70%   CORONARY ARTERY BYPASS GRAFT N/A 03/31/2014   Procedure: CORONARY ARTERY BYPASS GRAFTING (CABG) times 3 using left internal mammary artery and right saphenous vein.;  Surgeon: Melrose Nakayama, MD;  Location: [place];  Service: Open Heart Surgery;  Laterality: N/A;   DOBUTAMINE STRESS ECHO  06-08-2012   MODERATE HYPOKINESIS/ ISCHEMIA MID INFERIOR WALL   ESOPHAGOGASTRODUODENOSCOPY  08/15/2012   Procedure: ESOPHAGOGASTRODUODENOSCOPY (EGD);  Surgeon: Wonda Horner, MD;  Location[facility];  Service: Endoscopy;  Laterality: N/A;   ESOPHAGOGASTRODUODENOSCOPY N/A 02/17/2014   Procedure: ESOPHAGOGASTRODUODENOSCOPY (EGD);  Surgeon: Jeryl Columbia, MD;  Location: [facility] ENDOSCOPY;  Service: Endoscopy;  Laterality: N/A;   ESOPHAGOGASTRODUODENOSCOPY N/A 04/17/2020   Procedure: ESOPHAGOGASTRODUODENOSCOPY (EGD);  Surgeon: Ronnette Juniper, MD;  Location: [place];  Service: Gastroenterology;  Laterality: N/A;   ESOPHAGOGASTRODUODENOSCOPY N/A 09/11/2020   Procedure: ESOPHAGOGASTRODUODENOSCOPY (EGD);  Surgeon: Clarene Essex, MD;  Location: [facility];  Service: Endoscopy;  Laterality: N/A;   ESOPHAGOGASTRODUODENOSCOPY  10/09/2020   ESOPHAGOGASTRODUODENOSCOPY N/A 10/09/2020   Procedure: ESOPHAGOGASTRODUODENOSCOPY (EGD);  Surgeon: Otis Brace, MD;  Location: [facility] ENDOSCOPY;  Service: Gastroenterology;  Laterality: N/A;   ESOPHAGOGASTRODUODENOSCOPY N/A 01/08/2021   Procedure: ESOPHAGOGASTRODUODENOSCOPY (EGD);  Surgeon: Wilford Corner, MD;  Location: [facility];  Service: Endoscopy;  Laterality: N/A;   ESOPHAGOGASTRODUODENOSCOPY (EGD) WITH PROPOFOL N/A 09/30/2017   Procedure: ESOPHAGOGASTRODUODENOSCOPY (EGD) WITH PROPOFOL;  Surgeon: Wonda Horner, MD;  Location: [facility]  ENDOSCOPY;  Service: Endoscopy;  Laterality: N/A;   ESOPHAGOGASTRODUODENOSCOPY (EGD) WITH PROPOFOL N/A 12/20/2017   Procedure: ESOPHAGOGASTRODUODENOSCOPY (EGD) WITH PROPOFOL;  Surgeon: Clarene Essex, MD;  Location: [place];  Service: Endoscopy;  Laterality: N/A;   ESOPHAGOGASTRODUODENOSCOPY (EGD) WITH PROPOFOL N/A 08/10/2020   Procedure: ESOPHAGOGASTRODUODENOSCOPY (EGD) WITH PROPOFOL;  Surgeon: Wilford Corner, MD;  Location: [place];  Service: Endoscopy;  Laterality: N/A;   ESOPHAGOGASTRODUODENOSCOPY (EGD) WITH PROPOFOL N/A 11/20/2020   Procedure: ESOPHAGOGASTRODUODENOSCOPY (EGD) WITH PROPOFOL;  Surgeon: Ronald Lobo, MD;  Location: [facility];  Service: Endoscopy;  Laterality: N/A;   ESOPHAGOGASTRODUODENOSCOPY (EGD) WITH PROPOFOL N/A 02/28/2021   Procedure: ESOPHAGOGASTRODUODENOSCOPY (EGD) WITH PROPOFOL;  Surgeon: Clarene Essex, MD;  Location: [facility];  Service: Endoscopy;  Laterality: N/A;   GIVENS CAPSULE STUDY N/A 11/20/2020   Procedure: GIVENS CAPSULE STUDY;  Surgeon: Ronald Lobo, MD;  Location: [facility];  Service: Endoscopy;  Laterality: N/A;   HERNIA REPAIR     INTRAOPERATIVE TRANSESOPHAGEAL ECHOCARDIOGRAM N/A 03/31/2014   Procedure: INTRAOPERATIVE TRANSESOPHAGEAL ECHOCARDIOGRAM;  Surgeon:  Melrose Nakayama, MD;  Location: [place];  Service: Open Heart Surgery;  Laterality: N/A;   IR ANGIOGRAM SELECTIVE EACH ADDITIONAL VESSEL  06/16/2020   IR ANGIOGRAM SELECTIVE EACH ADDITIONAL VESSEL  06/16/2020   IR ANGIOGRAM SELECTIVE EACH ADDITIONAL VESSEL  06/16/2020   IR ANGIOGRAM SELECTIVE EACH ADDITIONAL VESSEL  06/16/2020   IR ANGIOGRAM SELECTIVE EACH ADDITIONAL VESSEL  06/16/2020   IR ANGIOGRAM SELECTIVE EACH ADDITIONAL VESSEL  06/16/2020   IR ANGIOGRAM SELECTIVE EACH ADDITIONAL VESSEL  06/16/2020   IR ANGIOGRAM SELECTIVE EACH ADDITIONAL VESSEL  06/16/2020   IR ANGIOGRAM SELECTIVE EACH ADDITIONAL VESSEL  06/16/2020   IR ANGIOGRAM SELECTIVE EACH ADDITIONAL VESSEL  06/30/2020    IR ANGIOGRAM SELECTIVE EACH ADDITIONAL VESSEL  06/30/2020   IR ANGIOGRAM VISCERAL SELECTIVE  06/16/2020   IR ANGIOGRAM VISCERAL SELECTIVE  06/30/2020   IR EMBO ARTERIAL NOT HEMORR HEMANG INC GUIDE ROADMAPPING  06/16/2020   IR EMBO TUMOR ORGAN ISCHEMIA INFARCT INC GUIDE ROADMAPPING  06/30/2020   IR EMBO VENOUS NOT HEMORR HEMANG  INC GUIDE ROADMAPPING  01/13/2021   IR IVUS EACH ADDITIONAL NON CORONARY VESSEL  01/13/2021   IR RADIOLOGIST EVAL & MGMT  05/06/2020   IR RADIOLOGIST EVAL & MGMT  05/20/2020   IR TIPS  01/13/2021   IR US GUIDE VASC ACCESS RIGHT  06/16/2020   IR US GUIDE VASC ACCESS RIGHT  06/30/2020   IR US GUIDE VASC ACCESS RIGHT  01/13/2021   LAPAROSCOPIC UMBILICAL HERNIA REPAIR W/ MESH  06-06-2011   LEFT HEART CATHETERIZATION WITH CORONARY ANGIOGRAM N/A 03/28/2014   Procedure: LEFT HEART CATHETERIZATION WITH CORONARY ANGIOGRAM;  Surgeon: Sinclair Grooms, MD;  Location: [facility] CATH LAB;  Service: Cardiovascular;  Laterality: N/A;   LIPOMA EXCISION Left 08/25/2017   Procedure: EXCISION LIPOMA LEFT POSTERIOR THIGH;  Surgeon: Clovis Riley, MD;  Location: [place];  Service: General;  Laterality: Left;   NEPHROLITHOTOMY  1990'S   OPEN APPENDECTOMY W/ PARTIAL CECECTOMY  05-16-2004   PERCUTANEOUS CORONARY STENT INTERVENTION (PCI-S) N/A 06/28/2012   Procedure: PERCUTANEOUS CORONARY STENT INTERVENTION (PCI-S);  Surgeon: Sherren Mocha, MD;  Location: [facility] CATH LAB;  Service: Cardiovascular;  Laterality: N/A;   POLYPECTOMY  11/20/2020   Procedure: POLYPECTOMY;  Surgeon: Ronald Lobo, MD;  Location: [facility];  Service: Endoscopy;;   RADIOLOGY WITH ANESTHESIA N/A 01/13/2021   Procedure: IR WITH ANESTHESIA - TIPS;  Surgeon: Suzette Battiest, MD;  Location: [place];  Service: Radiology;  Laterality: N/A;    Social History   Socioeconomic History   Marital status: Married    Spouse name: Not on file   Number of children: Not on file   Years of education: Not on file   Highest education level:  Not on file  Occupational History   Not on file  Tobacco Use   Smoking status: Former    Packs/day: 2.00    Years: 40.00    Pack years: 80.00    Types: Cigarettes    Quit date: 08/29/1996    Years since quitting: 24.5   Smokeless tobacco: Never  Vaping Use   Vaping Use: Never used  Substance and Sexual Activity   Alcohol use: Not Currently    Alcohol/week: 8.0 standard drinks    Types: 8 Cans of beer per week    Comment: 2 beers every other day   Drug use: No   Sexual activity: Yes  Other Topics Concern   Not on file  Social History Narrative   Lives in [place]  with wife, son, and son's family.   Social Determinants of Health   Financial Resource Strain: Medium Risk   Difficulty of Paying Living Expenses: Somewhat hard  Food Insecurity: No Food Insecurity   Worried About Charity fundraiser in the Last Year: Never true   Ran Out of Food in the Last Year: Never true  Transportation Needs: No Transportation Needs   Lack of Transportation (Medical): No   Lack of Transportation (Non-Medical): No  Physical Activity: Not on file  Stress: Not on file  Social Connections: Not on file  Intimate Partner Violence: Not on file    Allergies  Allergen Reactions   Fluzone Quadrivalent [Influenza Vac Split Quad] Other (See Comments)    The patient stated, in 10/2020: "I am not taking any more flu shots. It liked to have killed me."    Family History  Problem Relation Age of Onset   Lung cancer Sister    Cancer Sister        lung   Cancer Mother    Cancer Father        died in his 39s.   Cancer Brother        lung   Coronary artery disease Other    Diabetes Other    Colon cancer Other    Cancer Sister        lung    Prior to Admission medications   Medication Sig Start Date End Date Taking? Authorizing Provider  Accu-Chek Softclix Lancets lancets USE AS DIRECTED TO TEST BLOOD SUGAR FOUR TIMES DAILY 03/13/20   [name], MD  albuterol (VENTOLIN HFA) 108 (90  Base) MCG/ACT inhaler Inhale 2 puffs into the lungs every 6 (six) hours as needed for wheezing or shortness of breath. 03/01/21   Aline August, MD  blood glucose meter kit and supplies Dispense based on patient and insurance preference. Use up to four times daily as directed. (FOR ICD-10 E10.9, E11.9). 03/12/20   [name], MD  furosemide (LASIX) 40 MG tablet Take 1 tablet (40 mg total) by mouth daily. 01/31/21   Elgergawy, Silver Huguenin, MD  lactulose (CHRONULAC) 10 GM/15ML solution Take 30 mLs (20 g total) by mouth 3 (three) times daily. 01/30/21   Elgergawy, Silver Huguenin, MD  metFORMIN (GLUCOPHAGE) 850 MG tablet TAKE 1 TABLET(850 MG) BY MOUTH TWICE DAILY WITH A MEAL 01/08/21   [name], MD  nitroGLYCERIN (NITROSTAT) 0.4 MG SL tablet Place 1 tablet (0.4 mg total) under the tongue every 5 (five) minutes as needed for chest pain. 06/08/16   Nahser, Wonda Cheng, MD  pantoprazole (PROTONIX) 40 MG tablet Take 1 tablet (40 mg total) by mouth daily. 03/01/21   Aline August, MD  umeclidinium-vilanterol (ANORO ELLIPTA) 62.5-25 MCG/INH AEPB Inhale 1 puff into the lungs daily. 03/12/20   [name], MD    Physical Exam: Vitals:   03/04/21 1515 03/04/21 1542 03/04/21 1545 03/04/21 1615  BP: (!) 119/53 (!) 120/58 (!) 154/74 (!) 152/64  Pulse: 63 63 67 72  Resp: '13 13 17 16  ' Temp:  98.4 F (36.9 C)    TempSrc:  Oral    SpO2: 100% 100% 98% 100%  Weight:      Height:         General:  Appears calm and comfortable and is in NAD, mildly to moderately confused Eyes:  PERRL, EOMI, normal lids, iris ENT:  grossly normal hearing, lips & tongue, mmm; edentulous Neck:  no LAD, masses or thyromegaly  Cardiovascular:  RRR, no m/r/g. Marked LE lymphedema, chronic.  Respiratory:   CTA bilaterally with no wheezes/rales/rhonchi.  Normal respiratory effort. Abdomen:  soft, NT, ND Skin:  no rash or induration seen on limited exam Musculoskeletal:  grossly normal tone BUE/BLE, good ROM, no bony  abnormality Lower extremity:  Marked LE lymphedema.  Limited foot exam with no ulcerations.   Psychiatric:  mildly confused mood and affect, speech fluent and appropriate, AOx2 Neurologic:  CN 2-12 grossly intact, moves all extremities in coordinated fashion    Radiological Exams on Admission: Independently reviewed - see discussion in A/P where applicable  No results found.  EKG: Independently reviewed.  NSR with rate 70; nonspecific ST changes with no evidence of acute ischemia; NSCSLT   Labs on Admission: I have personally reviewed the available labs and imaging studies at the time of the admission.  Pertinent labs:   AP 139 Albumin 2.8 Bili 1.9 NH4 107; 28 on 5/31 but as high as 153 on 5/28 BNP 432.3 Lactate 1.7 WBC 1.8; baseline 2.7-3.1 Hgb 7.9 - stable Platelets 101; baseline roughly 130 COVID/flu negative   Assessment/Plan Principal Problem:   Hepatic encephalopathy (HCC) Active Problems:   Type 2 diabetes with complication (HCC)   Coronary artery disease involving native coronary artery of native heart without angina pectoris   Hyperlipidemia with target LDL less than 70   Hepatic cirrhosis (HCC)   Class 2 obesity due to excess calories with body mass index (BMI) of 36.0 to 36.9 in adult   CKD (chronic kidney disease), stage III (HCC)   (HFpEF) heart failure with preserved ejection fraction ([place])   Hepatocellular carcinoma ([place])   Hepatic encephalopathy -Patient with known alcoholic cirrhosis which has been decompensated with encephalopathy and esophageal varices -Presenting with worsening AMS since d/c on 7/4 -Ammonia 107; previously 28 on 5/31 after treatment -He does appear to be encephalopathic with waxing and waning mental status -No recent changes to any medications -No apparent s/sx of infection  -Precipitants of encephalopathy should be identified and treated if possible, including infection, GI bleeding, constipation, sedatives, new electrolyte  abnormalities, worsening renal failure, or hypoglycemia -He does have apparent hepatocellular CA (see below), which can lead to encephalopathy -Regardless of the cause, he does have AMS and needs empiric treatment for encephalopathy with lactulose titrated to 2-4 soft stools/day -Not apparently on Rifaximin -If not improving, he likely needs a more extensive evaluation for causes of his AMS -It is important to maintain dietary protein intake of 1.2-1.5g/kg/day; will request nutrition consult for tomorrow   Decompensated Cirrhosis -MELD/MELD-Na score is 11, with a mortality rate of <2% -s/p recent TIPS and embolization of gastric varices, no current concern for GI bleeding -He has h/o hepatocellular carcinoma s/p Y90 treatment and coil embolization of the R gastric artery -He is due for IR f/u; will notify Dr. Earleen Newport of admission via [place] so that he can see or arrange for outpatient f/u as appropriate  Pancytopenia -Not significantly different than prior -No apparent bleeding -Will check INR (normal on 7/2) -GI recommended outpatient hematology f/u for possible IV iron/epo infusions -If stable, likely ok for outpatient heme f/u as recommended previously; if not stable, suggest inpatient consult  LE lymphedema -Appears to be stable  Chronic diastolic CHF -Appears to be compensated currently -Preserved EF with grade 3 diastolic CHF on 05/19/18 -Hold Lasix  Stage 3a CKD -Appears to be stable at this time  CAD -s/p CABG -No ASA or other antiplatelets due to recurrent GI  bleeding  DM -Recent A1c was 5.4, indicating good control -hold Glucophage -Cover with sensitive-scale SSI   HTN -He does not appear to be taking medications for this issue at this time   COPD -Remote h/o smoking -Continue Anoro, prn Albuterol  Obesity -Body mass index is 36.49 kg/m..  -Weight loss should be encouraged -Outpatient PCP/bariatric medicine/bariatric surgery f/u encouraged     Note:  This patient has been tested and is negative for the novel coronavirus COVID-19.   Level of care: Progressive DVT prophylaxis: SCDs Code Status:  Full - confirmed with patient Family Communication: None present; I spoke with the patient's son by telephone at the time of admission. Disposition Plan:  The patient is from: home  Anticipated d/c is to: home   Anticipated d/c date will depend on clinical response to treatment, but possibly as early as tomorrow if he has excellent response to treatment  Patient is currently: acutely ill Consults called: Palliative care; IR by Secure Chat only; Nutrition  Admission status:  It is my clinical opinion that referral for OBSERVATION is reasonable and necessary in this patient based on the above information provided. The aforementioned taken together are felt to place the patient at high risk for further clinical deterioration. However it is anticipated that the patient may be medically stable for discharge from the hospital within 24 to 48 hours.    Karmen Bongo MD [facility]   How to contact the [facility] Attending or Consulting provider [place] or covering provider during after hours [place], for this patient?  Check the care team in [facility] and look for a) attending/consulting [facility] provider listed and b) the [facility] team listed Log into [email or web address] and use [place] universal password to access. If you do not have the password, please contact the hospital operator. Locate the [facility] provider you are looking for under [facility] and page to a number that you can be directly reached. If you still have difficulty reaching the provider, please page the [facility] (Director on Call) for the Hospitalists listed on amion for assistance.   03/04/2021, 5:41 PM

## 2021-03-04 NOTE — ED Notes (Signed)
Assume pt care, pt resting on bed comfortable, vital sign monitor in place, pt AO to him self, awaiting for hospitalist for admission.

## 2021-03-04 NOTE — ED Triage Notes (Signed)
Pt arrives via [facility] for reported confusion and lethargy. Pt's granddaughter called EMS for pt's status. Pt A+Ox2 on arrival (disoriented to time and situation). Pt recently admitted for same, and has hx of elevated ammonia levels.   EMS last VS - 132/68, HR 72, RR 20, 96% on RA, CBG 138.

## 2021-03-04 NOTE — ED Provider Notes (Signed)
  Face-to-face evaluation   History: Here for evaluation of confusion.  He is unable to give any history.  Level 5 caveat-confusion  Physical exam: Alert elderly male.  He is conversant but confused.  Mild dysarthria is present.  Abdomen soft nontender.  No respiratory distress.  Normal heart rate.  3-4+ pitting edema lower legs bilaterally  Medical screening examination/treatment/procedure(s) were conducted as a shared visit with non-physician practitioner(s) and myself.  I personally evaluated the patient during the encounter    Daleen Bo, MD 03/06/21 1401

## 2021-03-04 NOTE — ED Notes (Signed)
Placed on hold for giving report for 10 min, bedside report to be given.

## 2021-03-04 NOTE — ED Notes (Signed)
Report attempted 

## 2021-03-04 NOTE — ED Provider Notes (Signed)
[facility] EMERGENCY DEPARTMENT Provider Note   CSN: [id number] Arrival date & time: 03/04/21  1241     History Chief Complaint  Patient presents with   Altered Mental Status    [name] is a 75 y.o. male.  HPI  Patient presents with confusion.  Level 5 caveat applies.  After returning home from the ED 03/01/21 from hospital admission for GIB, his son reports he has not been acting like himself.  He is confused and not oriented to the situation.  The patient tells me he is fine, does not understand why something seems to be here.  States he has not picked up the medicine he is facilitating.  Denies any pain anywhere.  He was admitted to the hospital 02/27/2021 for anemia.  He was discharged on 03/01/2021.  He has history of many GI bleeding in the setting of portal hypertension and esophageal varices.  Patient has history of hepatic encephalopathy 12/2720.    Past Medical History:  Diagnosis Date   Arthritis    "joints tighten up sometimes" (03/27/2104)   Carcinoma of liver, hepatocellular ([place]) 06/30/2020   CHF (congestive heart failure) (HCC)    Chronic lower back pain    Coronary artery disease    a. 05/2012 Cath/PCI: LM 40ost, 50-60d, LAD 99p ruptured plaque (3.0x28 DES), LCX 50p/m, RCA 30-40p, 26m EF 65-70%.   Coronary artery disease involving native coronary artery of native heart without angina pectoris    Severe left main disease at catheterization July 2015  CABG x3 with a LIMA to the LAD, SVG to the OM, SVG to the PDA on 03/31/14. EF 60% by cath.    Diastolic heart failure (HCC)    GERD without esophagitis 08/04/2010   Hepatic cirrhosis ([place]    a. Dx 01/2014 - CT a/p    History of blood transfusion    "related to bleeding ulcers"   History of concussion    1976--  NO RESIDUAL   History of GI bleed    a. UGIB 07/2012;  b. 01/2014 admission with GIB/FOB stool req 1U prbc's->EGD showed portal gastropathy, barrett's esoph, and chronic active h. pylori  gastritis.   History of gout    2007 &  2008  LEFT LEG-- NO ISSUE SINCE   Hyperlipidemia    Iron deficiency anemia    Kidney stones    OA (osteoarthritis of spine)    LOWER BACK--  INTERMITTANT LEFT LEG NUMBNESS   OSA (obstructive sleep apnea)    PULMOLOGIST-  DR CLANCE--  MODERATE OSA  STARTED CPAP 2012--  BUT CURRENTLY HAS NOT USED PAST 6 MONTHS   Phimosis    a. s/p circumcision 2015.   Type 2 diabetes mellitus ([place]    Unspecified essential hypertension     Patient Active Problem List   Diagnosis Date Noted   Acute GI bleeding 02/27/2021   CKD stage 3 due to type 2 diabetes mellitus ([place] 02/27/2021   Hepatic encephalopathy ([place] 01/22/2021   GI bleeding 09/10/2020   CKD (chronic kidney disease), stage II    Hepatocellular carcinoma ([place] 08/09/2020   Routine general medical examination at a health care facility 05/06/2020   Iron deficiency anemia    Symptomatic anemia 04/15/2020   Pulmonary hypertension, unspecified ([place] 03/11/2020   Asthma, mild intermittent 025/85/2778  Alcoholic cirrhosis of liver with ascites ([place] 12/27/2019   (HFpEF) heart failure with preserved ejection fraction ([place] 12/24/2018   CKD (chronic kidney disease), stage III ([place] 12/18/2017  Peptic ulcer disease 12/18/2017   GI bleed 09/29/2017   Morbid obesity ([place]) 02/05/2016   S/P CABG x 3 03/31/2014   Hepatic cirrhosis ([place]) 02/27/2014   Melena 08/14/2012   Hyperlipidemia with target LDL less than 70 06/29/2012   Thrombocytopenia ([place]) 06/29/2012   Coronary artery disease involving native coronary artery of native heart without angina pectoris    OSA (obstructive sleep apnea)    Type 2 diabetes with complication ([place]) 63/08/6008   GERD without esophagitis 08/04/2010   History of gout    Hypertensive heart disease     Past Surgical History:  Procedure Laterality Date   ANKLE FRACTURE SURGERY Right 1989   "plate put in"   APPENDECTOMY  05-16-2004   open   BIOPSY  01/08/2021    Procedure: BIOPSY;  Surgeon: Wilford Corner, MD;  [facility];  Service: Endoscopy;;   CIRCUMCISION N/A 09/09/2013   Procedure: CIRCUMCISION ADULT;  Surgeon: Bernestine Amass, MD;  Location[facility];  Service: Urology;  Laterality: N/A;   COLECTOMY  05-16-2004   COLONOSCOPY WITH PROPOFOL N/A 11/20/2020   Procedure: COLONOSCOPY WITH PROPOFOL;  Surgeon: Ronald Lobo, MD;  [facility];  Service: Endoscopy;  Laterality: N/A;   CORONARY ANGIOPLASTY WITH STENT PLACEMENT  06/28/2012  DR COOPER   PCI W/  X1 DES to [place]. LAD/  LM  40% OSTIAL & 50-60% DISTAL /  50% PROX LCX/  30-40% PROX RCA & 50% MID RCA/   LVEF 65-70%   CORONARY ARTERY BYPASS GRAFT N/A 03/31/2014   Procedure: CORONARY ARTERY BYPASS GRAFTING (CABG) times 3 using left internal mammary artery and right saphenous vein.;  Surgeon: Melrose Nakayama, MD;  Location: [place];  Service: Open Heart Surgery;  Laterality: N/A;   DOBUTAMINE STRESS ECHO  06-08-2012   MODERATE HYPOKINESIS/ ISCHEMIA MID INFERIOR WALL   ESOPHAGOGASTRODUODENOSCOPY  08/15/2012   Procedure: ESOPHAGOGASTRODUODENOSCOPY (EGD);  Surgeon: Wonda Horner, MD;  Location: [facility] ENDOSCOPY;  Service: Endoscopy;  Laterality: N/A;   ESOPHAGOGASTRODUODENOSCOPY N/A 02/17/2014   Procedure: ESOPHAGOGASTRODUODENOSCOPY (EGD);  Surgeon: Jeryl Columbia, MD;  Location: [facility] ENDOSCOPY;  Service: Endoscopy;  Laterality: N/A;   ESOPHAGOGASTRODUODENOSCOPY N/A 04/17/2020   Procedure: ESOPHAGOGASTRODUODENOSCOPY (EGD);  Surgeon: Ronnette Juniper, MD;  Location: [place];  Service: Gastroenterology;  Laterality: N/A;   ESOPHAGOGASTRODUODENOSCOPY N/A 09/11/2020   Procedure: ESOPHAGOGASTRODUODENOSCOPY (EGD);  Surgeon: Clarene Essex, MD;  [facility];  Service: Endoscopy;  Laterality: N/A;   ESOPHAGOGASTRODUODENOSCOPY  10/09/2020   ESOPHAGOGASTRODUODENOSCOPY N/A 10/09/2020   Procedure: ESOPHAGOGASTRODUODENOSCOPY (EGD);  Surgeon: Otis Brace, MD;  Location[facility]  ENDOSCOPY;  Service: Gastroenterology;  Laterality: N/A;   ESOPHAGOGASTRODUODENOSCOPY N/A 01/08/2021   Procedure: ESOPHAGOGASTRODUODENOSCOPY (EGD);  Surgeon: Wilford Corner, MD;  [facility];  Service: Endoscopy;  Laterality: N/A;   ESOPHAGOGASTRODUODENOSCOPY (EGD) WITH PROPOFOL N/A 09/30/2017   Procedure: ESOPHAGOGASTRODUODENOSCOPY (EGD) WITH PROPOFOL;  Surgeon: Wonda Horner, MD;  [facility];  Service: Endoscopy;  Laterality: N/A;   ESOPHAGOGASTRODUODENOSCOPY (EGD) WITH PROPOFOL N/A 12/20/2017   Procedure: ESOPHAGOGASTRODUODENOSCOPY (EGD) WITH PROPOFOL;  Surgeon: Clarene Essex, MD;  Location: [place];  Service: Endoscopy;  Laterality: N/A;   ESOPHAGOGASTRODUODENOSCOPY (EGD) WITH PROPOFOL N/A 08/10/2020   Procedure: ESOPHAGOGASTRODUODENOSCOPY (EGD) WITH PROPOFOL;  Surgeon: Wilford Corner, MD;  Location: [place];  Service: Endoscopy;  Laterality: N/A;   ESOPHAGOGASTRODUODENOSCOPY (EGD) WITH PROPOFOL N/A 11/20/2020   Procedure: ESOPHAGOGASTRODUODENOSCOPY (EGD) WITH PROPOFOL;  Surgeon: Ronald Lobo, MD;  [facility];  Service: Endoscopy;  Laterality: N/A;   ESOPHAGOGASTRODUODENOSCOPY (EGD)  WITH PROPOFOL N/A 02/28/2021   Procedure: ESOPHAGOGASTRODUODENOSCOPY (EGD) WITH PROPOFOL;  Surgeon: Clarene Essex, MD;  [facility];  Service: Endoscopy;  Laterality: N/A;   GIVENS CAPSULE STUDY N/A 11/20/2020   Procedure: GIVENS CAPSULE STUDY;  Surgeon: Ronald Lobo, MD;  [facility];  Service: Endoscopy;  Laterality: N/A;   HERNIA REPAIR     INTRAOPERATIVE TRANSESOPHAGEAL ECHOCARDIOGRAM N/A 03/31/2014   Procedure: INTRAOPERATIVE TRANSESOPHAGEAL ECHOCARDIOGRAM;  Surgeon: Melrose Nakayama, MD;  Location: [place];  Service: Open Heart Surgery;  Laterality: N/A;   IR ANGIOGRAM SELECTIVE EACH ADDITIONAL VESSEL  06/16/2020   IR ANGIOGRAM SELECTIVE EACH ADDITIONAL VESSEL  06/16/2020   IR ANGIOGRAM SELECTIVE EACH ADDITIONAL VESSEL  06/16/2020   IR  ANGIOGRAM SELECTIVE EACH ADDITIONAL VESSEL  06/16/2020   IR ANGIOGRAM SELECTIVE EACH ADDITIONAL VESSEL  06/16/2020   IR ANGIOGRAM SELECTIVE EACH ADDITIONAL VESSEL  06/16/2020   IR ANGIOGRAM SELECTIVE EACH ADDITIONAL VESSEL  06/16/2020   IR ANGIOGRAM SELECTIVE EACH ADDITIONAL VESSEL  06/16/2020   IR ANGIOGRAM SELECTIVE EACH ADDITIONAL VESSEL  06/16/2020   IR ANGIOGRAM SELECTIVE EACH ADDITIONAL VESSEL  06/30/2020   IR ANGIOGRAM SELECTIVE EACH ADDITIONAL VESSEL  06/30/2020   IR ANGIOGRAM VISCERAL SELECTIVE  06/16/2020   IR ANGIOGRAM VISCERAL SELECTIVE  06/30/2020   IR EMBO ARTERIAL NOT HEMORR HEMANG INC GUIDE ROADMAPPING  06/16/2020   IR EMBO TUMOR ORGAN ISCHEMIA INFARCT INC GUIDE ROADMAPPING  06/30/2020   IR EMBO VENOUS NOT HEMORR HEMANG  INC GUIDE ROADMAPPING  01/13/2021   IR IVUS EACH ADDITIONAL NON CORONARY VESSEL  01/13/2021   IR RADIOLOGIST EVAL & MGMT  05/06/2020   IR RADIOLOGIST EVAL & MGMT  05/20/2020   IR TIPS  01/13/2021   IR US GUIDE VASC ACCESS RIGHT  06/16/2020   IR US GUIDE VASC ACCESS RIGHT  06/30/2020   IR US GUIDE VASC ACCESS RIGHT  01/13/2021   LAPAROSCOPIC UMBILICAL HERNIA REPAIR W/ MESH  06-06-2011   LEFT HEART CATHETERIZATION WITH CORONARY ANGIOGRAM N/A 03/28/2014   Procedure: LEFT HEART CATHETERIZATION WITH CORONARY ANGIOGRAM;  Surgeon: Sinclair Grooms, MD;  [facility] CATH LAB;  Service: Cardiovascular;  Laterality: N/A;   LIPOMA EXCISION Left 08/25/2017   Procedure: EXCISION LIPOMA LEFT POSTERIOR THIGH;  Surgeon: Clovis Riley, MD;  Location: [place];  Service: General;  Laterality: Left;   NEPHROLITHOTOMY  1990'S   OPEN APPENDECTOMY W/ PARTIAL CECECTOMY  05-16-2004   PERCUTANEOUS CORONARY STENT INTERVENTION (PCI-S) N/A 06/28/2012   Procedure: PERCUTANEOUS CORONARY STENT INTERVENTION (PCI-S);  Surgeon: Sherren Mocha, MD;  Location: [facility] CATH LAB;  Service: Cardiovascular;  Laterality: N/A;   POLYPECTOMY  11/20/2020   Procedure: POLYPECTOMY;  Surgeon: Ronald Lobo, MD;   [facility];  Service: Endoscopy;;   RADIOLOGY WITH ANESTHESIA N/A 01/13/2021   Procedure: IR WITH ANESTHESIA - TIPS;  Surgeon: Suzette Battiest, MD;  Location: [place];  Service: Radiology;  Laterality: N/A;       Family History  Problem Relation Age of Onset   Lung cancer Sister    Cancer Sister        lung   Cancer Mother    Cancer Father        died in his 34s.   Cancer Brother        lung   Coronary artery disease Other    Diabetes Other    Colon cancer Other    Cancer Sister        lung    Social History  Tobacco Use   Smoking status: Former    Packs/day: 2.00    Years: 40.00    Pack years: 80.00    Types: Cigarettes    Quit date: 08/29/1996    Years since quitting: 24.5   Smokeless tobacco: Never  Vaping Use   Vaping Use: Never used  Substance Use Topics   Alcohol use: Not Currently    Alcohol/week: 8.0 standard drinks    Types: 8 Cans of beer per week    Comment: 2 beers every other day   Drug use: No    Home Medications Prior to Admission medications   Medication Sig Start Date End Date Taking? Authorizing Provider  Accu-Chek Softclix Lancets lancets USE AS DIRECTED TO TEST BLOOD SUGAR FOUR TIMES DAILY 03/13/20   [name], MD  albuterol (VENTOLIN HFA) 108 (90 Base) MCG/ACT inhaler Inhale 2 puffs into the lungs every 6 (six) hours as needed for wheezing or shortness of breath. 03/01/21   Aline August, MD  blood glucose meter kit and supplies Dispense based on patient and insurance preference. Use up to four times daily as directed. (FOR ICD-10 E10.9, E11.9). 03/12/20   [name], MD  furosemide (LASIX) 40 MG tablet Take 1 tablet (40 mg total) by mouth daily. 01/31/21   Elgergawy, Silver Huguenin, MD  lactulose (CHRONULAC) 10 GM/15ML solution Take 30 mLs (20 g total) by mouth 3 (three) times daily. 01/30/21   Elgergawy, Silver Huguenin, MD  metFORMIN (GLUCOPHAGE) 850 MG tablet TAKE 1 TABLET(850 MG) BY MOUTH TWICE DAILY WITH A MEAL 01/08/21    [name], MD  nitroGLYCERIN (NITROSTAT) 0.4 MG SL tablet Place 1 tablet (0.4 mg total) under the tongue every 5 (five) minutes as needed for chest pain. 06/08/16   Nahser, Wonda Cheng, MD  pantoprazole (PROTONIX) 40 MG tablet Take 1 tablet (40 mg total) by mouth daily. 03/01/21   Aline August, MD  umeclidinium-vilanterol (ANORO ELLIPTA) 62.5-25 MCG/INH AEPB Inhale 1 puff into the lungs daily. 03/12/20   [name], MD    Allergies    Fluzone quadrivalent [influenza vac split quad]  Review of Systems   Review of Systems  Unable to perform ROS: Mental status change   Physical Exam Updated Vital Signs BP 134/60 (BP Location: Right Arm)   Pulse 68   Temp 98.1 F (36.7 C) (Oral)   Resp 16   Ht _0  (1.727 m)   Wt 108.9 kg   SpO2 99%   BMI 36.49 kg/m   Physical Exam Vitals and nursing note reviewed. Exam conducted with a chaperone present.  Constitutional:      Appearance: Normal appearance.     Comments: Chronically ill-appearing  HENT:     Head: Normocephalic and atraumatic.  Eyes:     General: No scleral icterus.       Right eye: No discharge.        Left eye: No discharge.     Extraocular Movements: Extraocular movements intact.     Pupils: Pupils are equal, round, and reactive to light.  Cardiovascular:     Rate and Rhythm: Normal rate and regular rhythm.     Pulses: Normal pulses.     Heart sounds: Normal heart sounds. No murmur heard.   No friction rub. No gallop.     Comments: Large truncal edema to the lower extremities.  Pulses faint but palpable.  Edema to the knee. Pulmonary:     Effort: Pulmonary effort is normal. No respiratory distress.  Breath sounds: Normal breath sounds.     Comments: Decreased lung sounds at bases, likely due to secondary to body habitus.  There are no adventitious lung sounds on exam.  Patient is satting at 99% on room air. Abdominal:     General: Abdomen is flat. Bowel sounds are normal. There is no distension.      Palpations: Abdomen is soft.     Tenderness: There is no abdominal tenderness.  Skin:    General: Skin is warm and dry.     Coloration: Skin is not jaundiced.  Neurological:     Mental Status: He is alert. Mental status is at baseline. He is disoriented and confused.     Cranial Nerves: Dysarthria present. No cranial nerve deficit.     Sensory: Sensation is intact.     Motor: No weakness.     Coordination: Coordination normal.     Comments: Patient has mild dysarthria.  He is not oriented to time, place, date, president.  He is oriented to self.  He is able to answer answer questions and follow commands, but sometimes needs to commands repeated multiple times.  Cranial nerves II through XII are grossly intact.  Grip strength is equal, lower extremity strength is equal.  Sensation to light touch is grossly intact.    ED Results / Procedures / Treatments   Labs (all labs ordered are listed, but only abnormal results are displayed) Labs Reviewed  CBG MONITORING, ED - Abnormal; Notable for the following components:      Result Value   Glucose-Capillary 132 (*)    All other components within normal limits  COMPREHENSIVE METABOLIC PANEL  CBC  LACTIC ACID, PLASMA  LACTIC ACID, PLASMA  BRAIN NATRIURETIC PEPTIDE  AMMONIA  URINALYSIS, ROUTINE W REFLEX MICROSCOPIC    EKG None  Radiology No results found.  Procedures Procedures   Medications Ordered in ED Medications - No data to display  ED Course  I have reviewed the triage vital signs and the nursing notes.  Pertinent labs & imaging results that were available during my care of the patient were reviewed by me and considered in my medical decision making (see chart for details).  Clinical Course as of 03/04/21 1632  Thu Mar 04, 2021  1511 Comprehensive metabolic panel(!) Bili and alk phos are at baseline compared to most recent values.  He has not had electrolyte derangement [HS]  1511 Ammonia(!): 107 Elevated compared  to 1 month ago it was 28 [HS]    Clinical Course User Index [HS] Sherrill Raring, PA-C   MDM Rules/Calculators/A&P                          75 year old male with medical history significant for  past medical history significant for hepatocellular carcinoma, hepatic cirrhosis with portal HTN and esophageal varices, multiple recent hospitalizations for GI bleeding with history of TIPS and embolization of gastric varices by IR on 01/13/2021, hepatic encephalopathy, CABG, CKD stage IIIa, PUD, gout, HLD, IDA, OSA not on CPAP, type 2 diabetes mellitus, essential hypertension, HFpEF (EF of 60-65% with grade 2 diastolic [id number]).  Patient has a pancytopenia and elevated ammonia consistent with hepatic encephalopathy.  I will give him some lactulose and consult hospitalist for likely admission.  Spoke with Dr. Clair Gulling who is happy to admit the patient.  Discussed HPI, physical exam and plan of care for this patient with attending Christ Kick. The attending physician evaluated this patient as part  of a shared visit and agrees with plan of care.   Final Clinical Impression(s) / ED Diagnoses Final diagnoses:  None    Rx / DC Orders ED Discharge Orders     None        Sherrill Raring, [place] 03/04/21 1632    Daleen Bo, MD 03/06/21 1401

## 2021-03-04 NOTE — ED Notes (Signed)
Bedside report given to Tees Toh, [place] [id number].

## 2021-03-05 ENCOUNTER — Telehealth: Payer: Medicare HMO

## 2021-03-05 DIAGNOSIS — Z6836 Body mass index (BMI) 36.0-36.9, adult: Secondary | ICD-10-CM | POA: Diagnosis not present

## 2021-03-05 DIAGNOSIS — Z8505 Personal history of malignant neoplasm of liver: Secondary | ICD-10-CM | POA: Diagnosis not present

## 2021-03-05 DIAGNOSIS — K7290 Hepatic failure, unspecified without coma: Secondary | ICD-10-CM | POA: Diagnosis not present

## 2021-03-05 DIAGNOSIS — I5032 Chronic diastolic (congestive) heart failure: Secondary | ICD-10-CM | POA: Diagnosis not present

## 2021-03-05 DIAGNOSIS — J449 Chronic obstructive pulmonary disease, unspecified: Secondary | ICD-10-CM | POA: Diagnosis present

## 2021-03-05 DIAGNOSIS — K7030 Alcoholic cirrhosis of liver without ascites: Secondary | ICD-10-CM | POA: Diagnosis not present

## 2021-03-05 DIAGNOSIS — M109 Gout, unspecified: Secondary | ICD-10-CM | POA: Diagnosis present

## 2021-03-05 DIAGNOSIS — Z20822 Contact with and (suspected) exposure to covid-19: Secondary | ICD-10-CM | POA: Diagnosis not present

## 2021-03-05 DIAGNOSIS — G8929 Other chronic pain: Secondary | ICD-10-CM | POA: Diagnosis present

## 2021-03-05 DIAGNOSIS — R69 Illness, unspecified: Secondary | ICD-10-CM | POA: Diagnosis not present

## 2021-03-05 DIAGNOSIS — Z8249 Family history of ischemic heart disease and other diseases of the circulatory system: Secondary | ICD-10-CM | POA: Diagnosis not present

## 2021-03-05 DIAGNOSIS — I2510 Atherosclerotic heart disease of native coronary artery without angina pectoris: Secondary | ICD-10-CM

## 2021-03-05 DIAGNOSIS — K7040 Alcoholic hepatic failure without coma: Secondary | ICD-10-CM | POA: Diagnosis present

## 2021-03-05 DIAGNOSIS — I130 Hypertensive heart and chronic kidney disease with heart failure and stage 1 through stage 4 chronic kidney disease, or unspecified chronic kidney disease: Secondary | ICD-10-CM | POA: Diagnosis not present

## 2021-03-05 DIAGNOSIS — I890 Lymphedema, not elsewhere classified: Secondary | ICD-10-CM | POA: Diagnosis present

## 2021-03-05 DIAGNOSIS — G4733 Obstructive sleep apnea (adult) (pediatric): Secondary | ICD-10-CM | POA: Diagnosis present

## 2021-03-05 DIAGNOSIS — E6609 Other obesity due to excess calories: Secondary | ICD-10-CM | POA: Diagnosis present

## 2021-03-05 DIAGNOSIS — E1122 Type 2 diabetes mellitus with diabetic chronic kidney disease: Secondary | ICD-10-CM | POA: Diagnosis present

## 2021-03-05 DIAGNOSIS — D61818 Other pancytopenia: Secondary | ICD-10-CM | POA: Diagnosis not present

## 2021-03-05 DIAGNOSIS — E785 Hyperlipidemia, unspecified: Secondary | ICD-10-CM | POA: Diagnosis present

## 2021-03-05 DIAGNOSIS — C220 Liver cell carcinoma: Secondary | ICD-10-CM | POA: Diagnosis not present

## 2021-03-05 DIAGNOSIS — K766 Portal hypertension: Secondary | ICD-10-CM | POA: Diagnosis not present

## 2021-03-05 DIAGNOSIS — I8510 Secondary esophageal varices without bleeding: Secondary | ICD-10-CM | POA: Diagnosis not present

## 2021-03-05 DIAGNOSIS — K219 Gastro-esophageal reflux disease without esophagitis: Secondary | ICD-10-CM | POA: Diagnosis present

## 2021-03-05 DIAGNOSIS — N1831 Chronic kidney disease, stage 3a: Secondary | ICD-10-CM | POA: Diagnosis not present

## 2021-03-05 DIAGNOSIS — R471 Dysarthria and anarthria: Secondary | ICD-10-CM | POA: Diagnosis present

## 2021-03-05 LAB — CBC
HCT: 25.7 % — ABNORMAL LOW (ref 39.0–52.0)
Hemoglobin: 7.6 g/dL — ABNORMAL LOW (ref 13.0–17.0)
MCH: 23.2 pg — ABNORMAL LOW (ref 26.0–34.0)
MCHC: 29.6 g/dL — ABNORMAL LOW (ref 30.0–36.0)
MCV: 78.4 fL — ABNORMAL LOW (ref 80.0–100.0)
Platelets: 92 10*3/uL — ABNORMAL LOW (ref 150–400)
RBC: 3.28 MIL/uL — ABNORMAL LOW (ref 4.22–5.81)
RDW: 19.4 % — ABNORMAL HIGH (ref 11.5–15.5)
WBC: 1.6 10*3/uL — ABNORMAL LOW (ref 4.0–10.5)
nRBC: 0 % (ref 0.0–0.2)

## 2021-03-05 LAB — GLUCOSE, CAPILLARY
Glucose-Capillary: 71 mg/dL (ref 70–99)
Glucose-Capillary: 132 mg/dL — ABNORMAL HIGH (ref 70–99)
Glucose-Capillary: 130 mg/dL — ABNORMAL HIGH (ref 70–99)
Glucose-Capillary: 162 mg/dL — ABNORMAL HIGH (ref 70–99)

## 2021-03-05 LAB — COMPREHENSIVE METABOLIC PANEL
ALT: 22 U/L (ref 0–44)
AST: 32 U/L (ref 15–41)
Albumin: 2.5 g/dL — ABNORMAL LOW (ref 3.5–5.0)
Alkaline Phosphatase: 130 U/L — ABNORMAL HIGH (ref 38–126)
Anion gap: 5 (ref 5–15)
BUN: 9 mg/dL (ref 8–23)
CO2: 23 mmol/L (ref 22–32)
Calcium: 8.2 mg/dL — ABNORMAL LOW (ref 8.9–10.3)
Chloride: 111 mmol/L (ref 98–111)
Creatinine, Ser: 0.89 mg/dL (ref 0.61–1.24)
GFR, Estimated: >60 mL/min (ref >60)
Glucose, Bld: 82 mg/dL (ref 70–99)
Potassium: 3.5 mmol/L (ref 3.5–5.1)
Sodium: 139 mmol/L (ref 135–145)
Total Bilirubin: 1.6 mg/dL — ABNORMAL HIGH (ref 0.3–1.2)
Total Protein: 5.7 g/dL — ABNORMAL LOW (ref 6.5–8.1)

## 2021-03-05 MED ORDER — FUROSEMIDE 40 MG PO TABS
40.0000 mg | ORAL_TABLET | Freq: Every day | ORAL | Status: DC
  Administered 2021-03-05 – 2021-03-06 (×2): 40 mg via ORAL
  Filled 2021-03-05: qty 2
  Filled 2021-03-05: qty 1

## 2021-03-05 MED ORDER — SPIRONOLACTONE 25 MG PO TABS
50.0000 mg | ORAL_TABLET | Freq: Every day | ORAL | Status: DC
  Administered 2021-03-05 – 2021-03-06 (×2): 50 mg via ORAL
  Filled 2021-03-05 (×2): qty 2

## 2021-03-05 MED ORDER — ADULT MULTIVITAMIN W/MINERALS CH
1.0000 | ORAL_TABLET | Freq: Every day | ORAL | Status: DC
  Administered 2021-03-05 – 2021-03-06 (×2): 1 via ORAL
  Filled 2021-03-05 (×2): qty 1

## 2021-03-05 MED ORDER — RIFAXIMIN 550 MG PO TABS
550.0000 mg | ORAL_TABLET | Freq: Two times a day (BID) | ORAL | Status: DC
  Administered 2021-03-05 – 2021-03-06 (×3): 550 mg via ORAL
  Filled 2021-03-05 (×3): qty 1

## 2021-03-05 MED ORDER — LACTULOSE ENEMA
300.0000 mL | Freq: Two times a day (BID) | ORAL | Status: DC | PRN
  Filled 2021-03-05: qty 300

## 2021-03-05 MED ORDER — LACTULOSE 10 GM/15ML PO SOLN
30.0000 g | Freq: Three times a day (TID) | ORAL | Status: DC
  Administered 2021-03-05 – 2021-03-06 (×4): 30 g via ORAL
  Filled 2021-03-05 (×4): qty 45

## 2021-03-05 NOTE — Consult Note (Signed)
   [facility] Inpatient Consult   03/05/2021  [name] 04/07/1946 [id number]  [place] Organization [ACO] Patient: [name]  Primary Care Provider:  Wille Celeste active in chronic care management with an Embedded RNCM.  Patient was screened for hospitalization of less than 30 days readmission noted, Patient will have the transition of care call conducted by the primary care provider.  Plan: Notification sent to the [place] of Embedded Care Management and made aware of hospitalization   Please contact for further questions,  [name], RN BSN [facility] Liaison  [phone] business mobile phone Toll free office [phone]  Fax number: [phone] [name]

## 2021-03-05 NOTE — Discharge Instructions (Addendum)
Cirrhosis Nutrition Therapy   The liver helps your body digest and store nutrients from food. When you have liver disease, your body may not process nutrients normally. Symptoms of liver disease interfere with your appetite and ability to eat. Monitoring your diet may help control symptoms of liver disease. Guidelines Signs and symptoms for cirrhosis are different from person to person. Therefore, your nutrition therapy must be tailored to your particular needs.  Here are some key points to keep in mind: Your doctor or registered dietitian may tell you to take a multivitamin and mineral supplement. The amount of protein you should eat will depend on your symptoms. You may feel better, be more comfortable, and stay stronger if you eat 4 to 6 small meals per day, instead of 3 larger ones.  If you can't eat enough food, you may want to drink nutritional supplements to get more calories. If you have ascites or edema (fluid in your abdomen), you may need to cut back on sodium (salt is the main source of sodium in food). Try to stay as active as possible. Even when you are tired, walking or exercise may help you feel better. Ask your doctor or registered dietitian for more specific guidelines if you need more information or help understanding what to do.  Tips Meal Planning Tips Plan to eat small amounts of food more often. You may find it easier to eat more if you have several small meals.  If you get full quickly or have no appetite, choose foods that are high in calories (such as whole milk and canned fruit packed with heavy syrup).  If your food tastes have changed, you may need to try new foods or foods that you did not like before. To cut down the amount of sodium in your favorite foods, try these approaches: Don't salt food at the table or when you're cooking.  One teaspoon of salt has 2,000 mg sodium. Avoid convenience foods, such as canned soups and pastas, boxed meals (like macaroni and  cheese), and frozen ready-to-eat meals. Try fresh or dried herbs, spices, oils, vinegar, or juices to add flavor and replace the taste of salt. Avoid seasoning salt, garlic salt, onion salt, celery salt, meat tenderizer, and high-sodium sauces, such as soy, teriyaki, oyster, barbeque, and steak sauces. Look for no-sodium or low-sodium versions of foods you like to eat, such as crackers, cheese, or soups. Talk with your doctor before using salt substitutes.  Foods Recommended Your registered dietitian will tell you how much food you need to eat each day to be healthy. Aim to eat a variety of foods each day. In general, you can eat most foods. However, there are a few exceptions, which are listed below.  Foods Not Recommended Avoid foods that are high in sodium, such as canned soups, many canned vegetables, processed meats and cheeses, condiments, and many snack foods. You can find out how much sodium is in a food by reading the food label. Look at the Nutrition Facts label. If a food has more than 300 milligrams (mg) sodium in a serving, then it is a high-sodium food. If you have ascites, you may need to limit sodium to 2,000 mg a day.  Avoid foods that may cause foodborne illnesses. For example, you should not eat: Unpasteurized or raw milk, cheese, yogurt, and all other milk products Raw or undercooked meat, poultry, fish, game, seafood, and raw tofu Raw or undercooked eggs and foods that might contain them Unwashed fresh fruits and  vegetables Unpasteurized fruit and vegetable juices and cider All raw vegetable sprouts (alfalfa, radish, broccoli, mung bean)   Cirrhosis Sample 1-Day Menu  Breakfast 1/2 cup oatmeal with cinnamon, raisins 1 teaspoon brown sugar 1 cup whole milk 1 slice whole wheat toast 1 teaspoon margarine 1 tablespoon jam 1 cup orange juice  Morning Snack 2 tablespoons granola 6 oz yogurt  Lunch 1 cup low-sodium chicken noodle soup 1/2 [place] sandwich: 1 slice whole  wheat bread  1 ounce [place] slice 1 slice tomato 1/2 leaf lettuce 1 teaspoon mayonnaise 3-4 baby carrots 1 sliced orange 1 sliced banana 1 cup low-fat milk  Afternoon Snack 5 whole wheat crackers, no-salt added 1 cup liquid high-calorie supplement   Evening Meal 4 oz pork tenderloin 1 small baked sweet potato 2 teaspoons margarine 1/8 cup stir-fried broccoli 1/8 cup stir-fried mushrooms 1/8 cup stir-fried pea pods 1/8 cup stir-fried onion 5 grapes 1/4 chopped fresh apples  Evening Snack 3-4 cookies 1/2 cup pudding  Copyright 2021  Academy of [employer] and Dietetics. All rights reserved.

## 2021-03-05 NOTE — Progress Notes (Addendum)
Initial Nutrition Assessment  DOCUMENTATION CODES:   Obesity unspecified  INTERVENTION:   -MVI with minerals daily -Double protein portions with meals -Provided "Cirrhosis Nutrition Therapy" handout from [facility] Nutrition Care Manual; RD attached to AVS/ discharge summary  NUTRITION DIAGNOSIS:   Increased nutrient needs related to chronic illness (cirrhosis) as evidenced by estimated needs.  GOAL:   Patient will meet greater than or equal to 90% of their needs  MONITOR:   PO intake, Supplement acceptance, Labs, Weight trends, Skin, I & O's  REASON FOR ASSESSMENT:   Consult Diet education  ASSESSMENT:   [name] is a 75 y.o. male with medical history significant of cirrhosis with varices, encephalopathy, and hepatocellular CA; CAD s/p CABG; multiple recent hospitalizations for GI bleeding s/p TIPS and embolization of gastric varices by IR on 7/74/12; chronic diastolic CHF; HLD; HTN; DM; stage 3a CKD; and OSA not on CPAP presenting with AMS.  He was previously admitted from 7/2-4 with recurrent GI bleeding with EGD showing non-bleeding gastric varices, portal hypertensive gastropathy, and chronic gastritis.  He is alert but somewhat confused - was standing in the doorway when I walked in the room because he needed to void.  He was given a urinal and fell asleep while standing to void at the bedside, urinating on the floor in addition to in the urinal.  However, after getting back into the bed, he was able to answer most questions.  He reported that his son made him come in because he was "weebly".  Pt admitted with hepatic encephalopathy.   Case discussed with RN, who reports with good appetite and taking medications well. His confusion has improved, but still present and is unsure as to why he is in the hospital. RN indicates pt with will answer questions appropriately, however, will engage in unusual behaviors, such as opening up his milk carton, pouring contents into condiments  container, and drink it. RN and RD do not feel like pt would retain any education at this time.   Spoke with pt at bedside, who was pleasant and in good spirits today. Observed that pt consumed 100% of breakfast tray other than the banana. Pt reports he consumes 3 meals per day and meals are prepared by his son ("I eat anything that doesn't have salt in it"). Typical intake is Breakfast: cereal OR grits, eggs, and coffee; Lunch: meat, starch, and vegetable; Dinner: meat, starch, and vegetable.   Pt unsure of UBW, but denies any weight loss. Reviewed wt hx; pt wt has been stable over the past 3 months.   Discussed importance of good meal intake to promote healing. Also reinforced importance of sodium restriction.   Pt appreciative of RD visit and is eager to go home today "so [place] go [place] the lake and fish". Per MD notes, no indication for discharge today.   Medications reviewed and include aldactone and lasix.   Lab Results  Component Value Date   HGBA1C 5.4 01/07/2021   PTA DM medications are 850 ml metformin BID.   Labs reviewed: CBGS: 71-96 (inpatient orders for glycemic control are 0-9 units insulin aspart TID with meals).    NUTRITION - FOCUSED PHYSICAL EXAM:  Flowsheet Row Most Recent Value  Orbital Region No depletion  Upper Arm Region No depletion  Thoracic and Lumbar Region No depletion  Buccal Region No depletion  Temple Region No depletion  Clavicle Bone Region No depletion  Clavicle and Acromion Bone Region No depletion  Scapular Bone Region No depletion  Dorsal Hand No depletion  Patellar Region No depletion  Anterior Thigh Region No depletion  Posterior Calf Region No depletion  Edema (RD Assessment) Moderate  Hair Reviewed  Eyes Reviewed  Mouth Reviewed  Skin Reviewed  Nails Reviewed       Diet Order:   Diet Order             Diet 2 gram sodium Room service appropriate? Yes; Fluid consistency: Thin  Diet effective ____                   EDUCATION  NEEDS:   Education needs have been addressed  Skin:  Skin Assessment: Reviewed RN Assessment  Last BM:  03/05/21  Height:   Ht Readings from Last 1 Encounters:  03/04/21 5\' 8"  (1.727 m)    Weight:   Wt Readings from Last 1 Encounters:  03/04/21 108.9 kg    Ideal Body Weight:  70 kg  BMI:  Body mass index is 36.49 kg/m.  Estimated Nutritional Needs:   Kcal:  2100-2300  Protein:  125-140 grams  Fluid:  > 2 L    Loistine Chance, RD, LDN, [place] Registered Dietitian II Certified Diabetes Care and Education Specialist Please refer to [facility] for RD and/or RD on-call/weekend/after hours pager

## 2021-03-05 NOTE — Progress Notes (Signed)
PROGRESS NOTE        PATIENT DETAILS Name: [name] Age: 75 y.o. Sex: male Date of Birth: 1946-02-13 Admit Date: 03/04/2021 Admitting Physician Karmen Bongo, MD [id number], Real Cons, MD  Brief Narrative: Patient is a 75 y.o. male with history of liver cirrhosis, hepatocellular carcinoma, recent hospitalizations for GI bleeding-s/p TIPS and embolization of gastric varices by IR in May 2022, CKD stage IIIa, chronic diastolic heart failure, OSA not on CPAP-presenting with altered mental status-found to have acute hepatic encephalopathy.  See below for further details.  Significant events: 7/7>> admitted for confusion-found to have hepatic encephalopathy.  Significant studies: None  Antimicrobial therapy: None  Microbiology data: None  Procedures : None  Consults: None  DVT Prophylaxis : SCDs Start: 03/04/21 1643   Subjective: Still somewhat slow to respond-answering questions somewhat appropriately-do not think he is back to baseline yet.  Assessment/Plan: Acute hepatic encephalopathy: Although improved-not yet at baseline-still slow-still has some asterixis-changed to oral lactulose (dose increased)-add rifaximin-and reassess on 7/9.    Liver cirrhosis-with chronic lower extremity lymphedema, pancytopenia, numerous recent hospitalizations for GI bleeding-s/p TIPS procedure and embolization of gastric varices: Stable-continues to have significant lower extremity edema which looks chronic-resume Lasix-add Aldactone.  Follow and adjust.  History of hepatocellular carcinoma-s/p Y 90 treatment and coil embolization: Resume outpatient follow-up with IR on discharge.  Chronic diastolic heart failure: With chronic lower extremity edema-see above regarding diuretics.  CKD stage IIIa: Creatinine stable-follow periodically.  CAD: No anginal symptoms-not on aspirin due to recurrent bleeding and thrombocytopenia.  COPD: No  wheezing-stable-continue bronchodilators  DM-1: CBG stable-continue SSI  Recent Labs    03/04/21 1300 03/04/21 1721 03/05/21 0759  GLUCAP 132* 96 71   Obesity: Estimated body mass index is 36.49 kg/m as calculated from the following:   Height as of this encounter: 5\' 8"  (1.727 m).   Weight as of this encounter: 108.9 kg.   Diet: Diet Order             Diet 2 gram sodium Room service appropriate? Yes; Fluid consistency: Thin  Diet effective ____                    Code Status: Full code   Family Communication: Left voicemail for son [name] 7/8  Disposition Plan: Status is: Observation  The patient will require care spanning > 2 midnights and should be moved to inpatient because: Inpatient level of care appropriate due to severity of illness  Dispo: The patient is from: Home              Anticipated d/c is to: Home              Patient currently is not medically stable to d/c.   Difficult to place patient No  Barriers to Discharge: Hepatic encephalopathy-although improved-not yet at baseline-not yet stable for discharge-medications being adjusted/optimized.  Antimicrobial agents: Anti-infectives (From admission, onward)    Start     Dose/Rate Route Frequency Ordered Stop   03/05/21 1030  rifaximin (XIFAXAN) tablet 550 mg        550 mg Oral 2 times daily 03/05/21 0933          Time spent: 35 minutes-Greater than 50% of this time was spent in counseling, explanation of diagnosis, planning of further management, and coordination of care.  MEDICATIONS: Scheduled Meds:  furosemide  40 mg Oral Daily   insulin aspart  0-9 Units Subcutaneous TID WC   lactulose  30 g Oral TID   pantoprazole  40 mg Oral Daily   rifaximin  550 mg Oral BID   sodium chloride flush  3 mL Intravenous Q12H   spironolactone  50 mg Oral Daily   umeclidinium-vilanterol  1 puff Inhalation Daily   Continuous Infusions: PRN Meds:.acetaminophen **OR**  acetaminophen, albuterol, hydrALAZINE, lactulose, ondansetron **OR** ondansetron (ZOFRAN) IV   PHYSICAL EXAM: Vital signs: Vitals:   03/04/21 1757 03/04/21 1934 03/05/21 0400 03/05/21 0800  BP:  (!) 120/52 (!) 127/53 (!) 133/56  Pulse:  (!) 59 66   Resp:  13 18   Temp: 98.4 F (36.9 C) 98.6 F (37 C) 97.8 F (36.6 C) 97.8 F (36.6 C)  TempSrc: Oral Axillary Oral Oral  SpO2:  99%    Weight:      Height:       Filed Weights   03/04/21 1254  Weight: 108.9 kg   Body mass index is 36.49 kg/m.   Gen Exam:Alert awake-not in any distress HEENT:atraumatic, normocephalic Chest: B/L clear to auscultation anteriorly CVS:S1S2 regular Abdomen:soft non tender, non distended Extremities:++ edema Neurology: Non focal Skin: no rash  I have personally reviewed following labs and imaging studies  LABORATORY DATA: CBC: Recent Labs  Lab 02/27/21 1816 02/28/21 0439 02/28/21 1637 03/01/21 0322 03/04/21 1310 03/05/21 0044  WBC 2.9* 2.7*  --  3.1* 1.8* 1.6*  NEUTROABS  --   --   --  2.4  --   --   HGB 5.9* 7.0* 8.3* 8.2* 7.9* 7.6*  HCT 21.0* 23.6* 28.5* 28.9* 27.6* 25.7*  MCV 78.4* 79.5*  --  83.0 80.0 78.4*  PLT 159 130*  --  137* 101* 92*    Basic Metabolic Panel: Recent Labs  Lab 02/27/21 1816 03/01/21 0322 03/04/21 1310 03/05/21 0044  NA 136 138 140 139  K 3.3* 3.7 3.5 3.5  CL 107 109 111 111  CO2 20* 21* 22 23  GLUCOSE 77 77 105* 82  BUN 13 13 12 9   CREATININE 0.90 1.00 1.00 0.89  CALCIUM 8.0* 7.7* 8.4* 8.2*  MG  --  1.9  --   --     GFR: Estimated Creatinine Clearance: 87.1 mL/min (by C-G formula based on SCr of 0.89 mg/dL).  Liver Function Tests: Recent Labs  Lab 02/27/21 1816 03/01/21 0322 03/04/21 1310 03/05/21 0044  AST 35 35 35 32  ALT 26 22 22 22   ALKPHOS 197* 157* 139* 130*  BILITOT 1.1 1.5* 1.9* 1.6*  PROT 6.1* 5.9* 6.1* 5.7*  ALBUMIN 2.8* 2.7* 2.8* 2.5*   No results for input(s): LIPASE, AMYLASE in the last 168 hours. Recent Labs  Lab  03/04/21 1315  AMMONIA 107*    Coagulation Profile: Recent Labs  Lab 02/27/21 1822 03/04/21 1850  INR 1.2 1.4*    Cardiac Enzymes: No results for input(s): CKTOTAL, CKMB, CKMBINDEX, TROPONINI in the last 168 hours.  BNP (last 3 results) No results for input(s): PROBNP in the last 8760 hours.  Lipid Profile: No results for input(s): CHOL, HDL, LDLCALC, TRIG, CHOLHDL, LDLDIRECT in the last 72 hours.  Thyroid Function Tests: No results for input(s): TSH, T4TOTAL, FREET4, T3FREE, THYROIDAB in the last 72 hours.  Anemia Panel: No results for input(s): VITAMINB12, FOLATE, FERRITIN, TIBC, IRON, RETICCTPCT in the last 72 hours.  Urine analysis:    Component Value Date/Time   COLORURINE YELLOW 01/22/2021 1856  APPEARANCEUR CLEAR 01/22/2021 1856   LABSPEC 1.014 01/22/2021 1856   PHURINE 6.0 01/22/2021 1856   GLUCOSEU NEGATIVE 01/22/2021 1856   HGBUR NEGATIVE 01/22/2021 1856   BILIRUBINUR NEGATIVE 01/22/2021 1856   KETONESUR NEGATIVE 01/22/2021 1856   PROTEINUR 30 (A) 01/22/2021 1856   UROBILINOGEN 0.2 03/29/2014 1546   NITRITE NEGATIVE 01/22/2021 1856   LEUKOCYTESUR NEGATIVE 01/22/2021 1856    Sepsis Labs: Lactic Acid, Venous    Component Value Date/Time   LATICACIDVEN 1.7 03/04/2021 1315    MICROBIOLOGY: Recent Results (from the past 240 hour(s))  Resp Panel by RT-PCR (Flu A&B, Covid) Nasopharyngeal Swab     Status: None   Collection Time: 02/27/21  7:51 PM   Specimen: Nasopharyngeal Swab; Nasopharyngeal(NP) swabs in vial transport medium  Result Value Ref Range Status   SARS Coronavirus 2 by RT PCR NEGATIVE NEGATIVE Final    Comment: (NOTE) SARS-CoV-2 target nucleic acids are NOT DETECTED.  The SARS-CoV-2 RNA is generally detectable in upper respiratory specimens during the acute phase of infection. The lowest concentration of SARS-CoV-2 viral copies this assay can detect is 138 copies/mL. A negative result does not preclude SARS-Cov-2 infection and should  not be used as the sole basis for treatment or other patient management decisions. A negative result may occur with  improper specimen collection/handling, submission of specimen other than nasopharyngeal swab, presence of viral mutation(s) within the areas targeted by this assay, and inadequate number of viral copies(<138 copies/mL). A negative result must be combined with clinical observations, patient history, and epidemiological information. The expected result is Negative.  Fact Sheet for Patients:  EntrepreneurPulse.com.au  Fact Sheet for Healthcare Providers:  IncredibleEmployment.be  This test is no t yet approved or cleared by the [place] FDA and  has been authorized for detection and/or diagnosis of SARS-CoV-2 by FDA under an Emergency Use Authorization (EUA). This EUA will remain  in effect (meaning this test can be used) for the duration of the COVID-19 declaration under Section 564(b)(1) of the Act, 21 U.S.C.section 360bbb-3(b)(1), unless the authorization is terminated  or revoked sooner.       Influenza A by PCR NEGATIVE NEGATIVE Final   Influenza B by PCR NEGATIVE NEGATIVE Final    Comment: (NOTE) The Xpert Xpress SARS-CoV-2/FLU/RSV plus assay is intended as an aid in the diagnosis of influenza from Nasopharyngeal swab specimens and should not be used as a sole basis for treatment. Nasal washings and aspirates are unacceptable for Xpert Xpress SARS-CoV-2/FLU/RSV testing.  Fact Sheet for Patients: EntrepreneurPulse.com.au  Fact Sheet for Healthcare Providers: IncredibleEmployment.be  This test is not yet approved or cleared by the [place] FDA and has been authorized for detection and/or diagnosis of SARS-CoV-2 by FDA under an Emergency Use Authorization (EUA). This EUA will remain in effect (meaning this test can be used) for the duration of the COVID-19 declaration under  Section 564(b)(1) of the Act, 21 U.S.C. section 360bbb-3(b)(1), unless the authorization is terminated or revoked.  Performed at [facility], [place] [address], [place], [place] [address]   MRSA Next Gen by PCR, Nasal     Status: None   Collection Time: 02/28/21 10:53 AM   Specimen: Nasal Mucosa; Nasal Swab  Result Value Ref Range Status   MRSA by PCR Next Gen NOT DETECTED NOT DETECTED Final    Comment: (NOTE) The GeneXpert MRSA Assay (FDA approved for NASAL specimens only), is one component of a comprehensive MRSA colonization surveillance program. It is not intended to diagnose MRSA  infection nor to guide or monitor treatment for MRSA infections. Test performance is not FDA approved in patients less than 20 years old. Performed at [facility], [place] [address] [place], [place] [address]   Resp Panel by RT-PCR (Flu A&B, Covid) Nasopharyngeal Swab     Status: None   Collection Time: 03/04/21  1:27 PM   Specimen: Nasopharyngeal Swab; Nasopharyngeal(NP) swabs in vial transport medium  Result Value Ref Range Status   SARS Coronavirus 2 by RT PCR NEGATIVE NEGATIVE Final    Comment: (NOTE) SARS-CoV-2 target nucleic acids are NOT DETECTED.  The SARS-CoV-2 RNA is generally detectable in upper respiratory specimens during the acute phase of infection. The lowest concentration of SARS-CoV-2 viral copies this assay can detect is 138 copies/mL. A negative result does not preclude SARS-Cov-2 infection and should not be used as the sole basis for treatment or other patient management decisions. A negative result may occur with  improper specimen collection/handling, submission of specimen other than nasopharyngeal swab, presence of viral mutation(s) within the areas targeted by this assay, and inadequate number of viral copies(<138 copies/mL). A negative result must be combined with clinical observations, patient history, and epidemiological information.  The expected result is Negative.  Fact Sheet for Patients:  EntrepreneurPulse.com.au  Fact Sheet for Healthcare Providers:  IncredibleEmployment.be  This test is no t yet approved or cleared by the [place] FDA and  has been authorized for detection and/or diagnosis of SARS-CoV-2 by FDA under an Emergency Use Authorization (EUA). This EUA will remain  in effect (meaning this test can be used) for the duration of the COVID-19 declaration under Section 564(b)(1) of the Act, 21 U.S.C.section 360bbb-3(b)(1), unless the authorization is terminated  or revoked sooner.       Influenza A by PCR NEGATIVE NEGATIVE Final   Influenza B by PCR NEGATIVE NEGATIVE Final    Comment: (NOTE) The Xpert Xpress SARS-CoV-2/FLU/RSV plus assay is intended as an aid in the diagnosis of influenza from Nasopharyngeal swab specimens and should not be used as a sole basis for treatment. Nasal washings and aspirates are unacceptable for Xpert Xpress SARS-CoV-2/FLU/RSV testing.  Fact Sheet for Patients: EntrepreneurPulse.com.au  Fact Sheet for Healthcare Providers: IncredibleEmployment.be  This test is not yet approved or cleared by the [place] FDA and has been authorized for detection and/or diagnosis of SARS-CoV-2 by FDA under an Emergency Use Authorization (EUA). This EUA will remain in effect (meaning this test can be used) for the duration of the COVID-19 declaration under Section 564(b)(1) of the Act, 21 U.S.C. section 360bbb-3(b)(1), unless the authorization is terminated or revoked.  Performed at [facility], [place] [address], [place],  [address]     RADIOLOGY STUDIES/RESULTS: No results found.   LOS: 0 days   Oren Binet, MD  [facility]    To contact the attending provider between 7A-7P or the covering provider during after hours 7P-7A, please log into the web site [email or web address]  and access using universal [place] password for that web site. If you do not have the password, please call the hospital operator.  03/05/2021, 11:14 AM

## 2021-03-05 NOTE — Evaluation (Signed)
Physical Therapy Evaluation Patient Details Name: [name] MRN: [id number] DOB: 09/25/1945 Today's Date: 03/05/2021   History of Present Illness  Pt is a 75 y/o male admitted secondary to AMS. Likely secondary to hepatic encephalopathy. Pt with recent admission in early 01/2021 for same. PMH includes CAD s/p CABG, dCHF, HTN, alcoholic cirrhosis, and hepatocellular carcinoma.  Clinical Impression  Patient received sleeping in bed, wakes to my voice. Patient is agreeable to PT session. He is mod independent with bed mobility. Min guard for transfer sit to stand from bed and commode. Patient ambulated 20 feet with RW and min guard. Slow shuffle gait. Patient reports he feels back to baseline. Has mild SOB with activity. Patient will continue to benefit from skilled PT while here to improve functional independence and strength for safe return home.      Follow Up Recommendations Home health PT;Supervision/Assistance - 24 hour ( will likely refuse HHPT)    Equipment Recommendations  None recommended by PT    Recommendations for Other Services       Precautions / Restrictions Precautions Precautions: Fall Restrictions Weight Bearing Restrictions: No      Mobility  Bed Mobility Overal bed mobility: Modified Independent Bed Mobility: Supine to Sit;Sit to Supine     Supine to sit: Modified independent (Device/Increase time) Sit to supine: Modified independent (Device/Increase time)   General bed mobility comments: use of bed rails with HOB elevated. increased time and effort.    Transfers Overall transfer level: Needs assistance Equipment used: Rolling walker (2 wheeled)   Sit to Stand: Min guard         General transfer comment: Patient requires min guard for sit to stand from bed and from commode.  Ambulation/Gait Ambulation/Gait assistance: Min guard Gait Distance (Feet): 20 Feet Assistive device: Rolling walker (2 wheeled) Gait Pattern/deviations: Shuffle;Decreased  step length - right;Decreased step length - left Gait velocity: Decreased   General Gait Details: Very slow guarded gait with small shuffling steps.  Reports he feels like he is at baseline.  Stairs            Wheelchair Mobility    Modified Rankin (Stroke Patients Only)       Balance Overall balance assessment: Needs assistance Sitting-balance support: Feet supported Sitting balance-Leahy Scale: Good     Standing balance support: Bilateral upper extremity supported;During functional activity Standing balance-Leahy Scale: Fair Standing balance comment: Reliant on BUE support                             Pertinent Vitals/Pain Pain Assessment: No/denies pain    Home Living Family/patient expects to be discharged to:: Private residence Living Arrangements: Spouse/significant other;Children Available Help at Discharge: Family;Available 24 hours/day Type of Home: House Home Access: Stairs to enter Entrance Stairs-Rails: Left Entrance Stairs-Number of Steps: 1 Home Layout: Two level;Able to live on main level with bedroom/bathroom Home Equipment: Kasandra Knudsen - single point;Walker - 2 wheels;Shower seat;Bedside commode Additional Comments: mult familymembers but wife not in good health    Prior Function Level of Independence: Needs assistance   Gait / Transfers Assistance Needed: Uses RW for ambulation.  ADL's / Homemaking Assistance Needed: Reports wife and son assist with med management. Reports independence with ADLs.  Comments: Unsure of accuracy as no family present     Hand Dominance   Dominant Hand: Right    Extremity/Trunk Assessment   Upper Extremity Assessment Upper Extremity Assessment: Generalized weakness  Lower Extremity Assessment Lower Extremity Assessment: Generalized weakness;RLE deficits/detail;LLE deficits/detail RLE Deficits / Details: B LEs edematous    Cervical / Trunk Assessment Cervical / Trunk Assessment: Normal   Communication   Communication: HOH  Cognition Arousal/Alertness: Lethargic Behavior During Therapy: Flat affect Overall Cognitive Status: No family/caregiver present to determine baseline cognitive functioning                                 General Comments: slow processing noted, patient states he is 74 today, then no he is 75 today ( birthday is [birth year].)      General Comments      Exercises     Assessment/Plan    PT Assessment Patient needs continued PT services  PT Problem List Decreased strength;Decreased balance;Decreased activity tolerance;Decreased mobility;Decreased knowledge of use of DME;Decreased cognition;Decreased safety awareness;Decreased knowledge of precautions       PT Treatment Interventions Gait training;DME instruction;Therapeutic exercise;Functional mobility training;Stair training;Therapeutic activities;Balance training;Patient/family education    PT Goals (Current goals can be found in the Care Plan section)  Acute Rehab PT Goals Patient Stated Goal: to return home PT Goal Formulation: With patient Time For Goal Achievement: 03/19/21 Potential to Achieve Goals: Fair    Frequency Min 2X/week   Barriers to discharge        Co-evaluation               AM-PAC PT "6 Clicks" Mobility  Outcome Measure Help needed turning from your back to your side while in a flat bed without using bedrails?: None Help needed moving from lying on your back to sitting on the side of a flat bed without using bedrails?: A Little Help needed moving to and from a bed to a chair (including a wheelchair)?: A Little Help needed standing up from a chair using your arms (e.g., wheelchair or bedside chair)?: A Little Help needed to walk in hospital room?: A Little Help needed climbing 3-5 steps with a railing? : A Lot 6 Click Score: 18    End of Session Equipment Utilized During Treatment: Gait belt Activity Tolerance: Patient limited by  fatigue Patient left: in bed;with call bell/phone within reach;with bed alarm set Nurse Communication: Mobility status PT Visit Diagnosis: Muscle weakness (generalized) (M62.81);Difficulty in walking, not elsewhere classified (R26.2)    Time: 6659-9357 PT Time Calculation (min) (ACUTE ONLY): 26 min   Charges:   PT Evaluation $PT Eval Moderate Complexity: 1 Mod PT Treatments $Gait Training: 8-22 mins        Tiahna Cure, PT, GCS 03/05/21,4:14 PM

## 2021-03-06 DIAGNOSIS — C220 Liver cell carcinoma: Secondary | ICD-10-CM | POA: Diagnosis not present

## 2021-03-06 DIAGNOSIS — I2510 Atherosclerotic heart disease of native coronary artery without angina pectoris: Secondary | ICD-10-CM | POA: Diagnosis not present

## 2021-03-06 DIAGNOSIS — I5032 Chronic diastolic (congestive) heart failure: Secondary | ICD-10-CM | POA: Diagnosis not present

## 2021-03-06 DIAGNOSIS — K7290 Hepatic failure, unspecified without coma: Secondary | ICD-10-CM | POA: Diagnosis not present

## 2021-03-06 LAB — GLUCOSE, CAPILLARY
Glucose-Capillary: 108 mg/dL — ABNORMAL HIGH (ref 70–99)
Glucose-Capillary: 118 mg/dL — ABNORMAL HIGH (ref 70–99)

## 2021-03-06 LAB — COMPREHENSIVE METABOLIC PANEL
ALT: 23 U/L (ref 0–44)
AST: 35 U/L (ref 15–41)
Albumin: 2.6 g/dL — ABNORMAL LOW (ref 3.5–5.0)
Alkaline Phosphatase: 147 U/L — ABNORMAL HIGH (ref 38–126)
Anion gap: 5 (ref 5–15)
BUN: 9 mg/dL (ref 8–23)
CO2: 24 mmol/L (ref 22–32)
Calcium: 8.5 mg/dL — ABNORMAL LOW (ref 8.9–10.3)
Chloride: 110 mmol/L (ref 98–111)
Creatinine, Ser: 0.88 mg/dL (ref 0.61–1.24)
GFR, Estimated: >60 mL/min (ref >60)
Glucose, Bld: 130 mg/dL — ABNORMAL HIGH (ref 70–99)
Potassium: 3.8 mmol/L (ref 3.5–5.1)
Sodium: 139 mmol/L (ref 135–145)
Total Bilirubin: 1.9 mg/dL — ABNORMAL HIGH (ref 0.3–1.2)
Total Protein: 6 g/dL — ABNORMAL LOW (ref 6.5–8.1)

## 2021-03-06 LAB — CBC
HCT: 27.3 % — ABNORMAL LOW (ref 39.0–52.0)
Hemoglobin: 8.2 g/dL — ABNORMAL LOW (ref 13.0–17.0)
MCH: 23.7 pg — ABNORMAL LOW (ref 26.0–34.0)
MCHC: 30 g/dL (ref 30.0–36.0)
MCV: 78.9 fL — ABNORMAL LOW (ref 80.0–100.0)
Platelets: 97 10*3/uL — ABNORMAL LOW (ref 150–400)
RBC: 3.46 MIL/uL — ABNORMAL LOW (ref 4.22–5.81)
RDW: 19.5 % — ABNORMAL HIGH (ref 11.5–15.5)
WBC: 2.2 10*3/uL — ABNORMAL LOW (ref 4.0–10.5)
nRBC: 0 % (ref 0.0–0.2)

## 2021-03-06 LAB — AMMONIA: Ammonia: 71 umol/L — ABNORMAL HIGH (ref 9–35)

## 2021-03-06 MED ORDER — LACTULOSE 10 GM/15ML PO SOLN: 30.0000 g | Freq: Three times a day (TID) | ORAL | 1 refills | Status: DC

## 2021-03-06 MED ORDER — SPIRONOLACTONE 50 MG PO TABS: 50.0000 mg | ORAL_TABLET | Freq: Every day | ORAL | 1 refills | Status: DC

## 2021-03-06 MED ORDER — RIFAXIMIN 550 MG PO TABS: 550.0000 mg | ORAL_TABLET | Freq: Two times a day (BID) | ORAL | 0 refills | Status: DC

## 2021-03-06 NOTE — Discharge Summary (Signed)
PATIENT DETAILS Name: [name] Age: 75 y.o. Sex: male Date of Birth: [birth year] MRN: [id number]. Admitting Physician: Karmen Bongo, MD NOT:[email or web address], Real Cons, MD  Admit Date: 03/04/2021 Discharge date: 03/06/2021  Recommendations for Outpatient Follow-up:  Follow up with PCP in 1-2 weeks Please obtain CMP/CBC in one week   Admitted From:  Home  Disposition: Home with home health services   [place]: Yes  Equipment/Devices: None  Discharge Condition: Stable  CODE STATUS: FULL CODE  Diet recommendation:  Diet Order             Diet - low sodium heart healthy           Diet Carb Modified           Diet 2 gram sodium Room service appropriate? Yes; Fluid consistency: Thin  Diet effective ____                    Brief Narrative: Patient is a 75 y.o. male with history of liver cirrhosis, hepatocellular carcinoma, recent hospitalizations for GI bleeding-s/p TIPS and embolization of gastric varices by IR in May 2022, CKD stage IIIa, chronic diastolic heart failure, OSA not on CPAP-presenting with altered mental status-found to have acute hepatic encephalopathy.  See below for further details.   Significant events: 7/7>> admitted for confusion-found to have hepatic encephalopathy.   Significant studies: None   Antimicrobial therapy: None   Microbiology data: None   Procedures : None   Consults: None  Brief Hospital Course: Acute hepatic encephalopathy: Significantly improved-completely awake and alert-no longer with asterixis-continue lactulose-continue rifaximin on discharge.  Follow-up with primary GI MD/PCP for further optimization.       Liver cirrhosis-with chronic lower extremity lymphedema, pancytopenia, numerous recent hospitalizations for GI bleeding-s/p TIPS procedure and embolization of gastric varices: Stable-continues to have significant lower extremity edema which looks chronic-continue Lasix-Aldactone has been added.   Follow-up with PCP and reassess.    History of hepatocellular carcinoma-s/p Y 90 treatment and coil embolization: Resume outpatient follow-up with IR on discharge.   Chronic diastolic heart failure: With chronic lower extremity edema-see above regarding diuretics.   CKD stage IIIa: Creatinine stable-follow periodically.   CAD: No anginal symptoms-not on aspirin due to recurrent bleeding and thrombocytopenia.   COPD: No wheezing-stable-continue bronchodilators   DM-1: CBG stable-resume usual diabetic regimen on discharge.  Obesity: Estimated body mass index is 36.49 kg/m as calculated from the following:   Height as of this encounter: '5\' 8"'  (1.727 m).   Weight as of this encounter: 108.9 kg.   Nutrition Status: Nutrition Problem: Increased nutrient needs Etiology: chronic illness (cirrhosis) Signs/Symptoms: estimated needs Interventions: MVI, Education, Refer to RD note for recommendations    Discharge Diagnoses:  Principal Problem:   Hepatic encephalopathy ([place]) Active Problems:   Type 2 diabetes with complication ([place])   Coronary artery disease involving native coronary artery of native heart without angina pectoris   Hyperlipidemia with target LDL less than 70   Hepatic cirrhosis (HCC)   Class 2 obesity due to excess calories with body mass index (BMI) of 36.0 to 36.9 in adult   CKD (chronic kidney disease), stage III (HCC)   (HFpEF) heart failure with preserved ejection fraction ([place])   Hepatocellular carcinoma ([place])   Discharge Instructions:  Activity:  As tolerated with Full fall precautions use walker/cane & assistance as needed   Discharge Instructions     Diet - low sodium heart healthy   Complete by: As directed  Diet Carb Modified   Complete by: As directed    Discharge instructions   Complete by: As directed    1.)  Titrate lactulose dosage so that you have around 3-4 bowel movements daily.   Follow with Primary MD  [name], MD in 1-2  weeks  Please get a complete blood count and chemistry panel checked by your Primary MD at your next visit, and again as instructed by your Primary MD.  Get Medicines reviewed and adjusted: Please take all your medications with you for your next visit with your Primary MD  Laboratory/radiological data: Please request your Primary MD to go over all hospital tests and procedure/radiological results at the follow up, please ask your Primary MD to get all Hospital records sent to his/her office.  In some cases, they will be blood work, cultures and biopsy results pending at the time of your discharge. Please request that your primary care M.D. follows up on these results.  Also Note the following: If you experience worsening of your admission symptoms, develop shortness of breath, life threatening emergency, suicidal or homicidal thoughts you must seek medical attention immediately by calling 911 or calling your MD immediately  if symptoms less severe.  You must read complete instructions/literature along with all the possible adverse reactions/side effects for all the Medicines you take and that have been prescribed to you. Take any new Medicines after you have completely understood and accpet all the possible adverse reactions/side effects.   Do not drive when taking Pain medications or sleeping medications (Benzodaizepines)  Do not take more than prescribed Pain, Sleep and Anxiety Medications. It is not advisable to combine anxiety,sleep and pain medications without talking with your primary care practitioner  Special Instructions: If you have smoked or chewed Tobacco  in the last 2 yrs please stop smoking, stop any regular Alcohol  and or any Recreational drug use.  Wear Seat belts while driving.  Please note: You were cared for by a hospitalist during your hospital stay. Once you are discharged, your primary care physician will handle any further medical issues. Please note that NO REFILLS  for any discharge medications will be authorized once you are discharged, as it is imperative that you return to your primary care physician (or establish a relationship with a primary care physician if you do not have one) for your post hospital discharge needs so that they can reassess your need for medications and monitor your lab values.   Increase activity slowly   Complete by: As directed       Allergies as of 03/06/2021       Reactions   Fluzone Quadrivalent [influenza Vac Split Quad] Other (See Comments)   The patient stated, in 10/2020: "I am not taking any more flu shots. It liked to have killed me."        Medication List     TAKE these medications    Accu-Chek Softclix Lancets lancets USE AS DIRECTED TO TEST BLOOD SUGAR FOUR TIMES DAILY   albuterol 108 (90 Base) MCG/ACT inhaler Commonly known as: VENTOLIN HFA Inhale 2 puffs into the lungs every 6 (six) hours as needed for wheezing or shortness of breath.   Anoro Ellipta 62.5-25 MCG/INH Aepb Generic drug: umeclidinium-vilanterol Inhale 1 puff into the lungs daily.   blood glucose meter kit and supplies Dispense based on patient and insurance preference. Use up to four times daily as directed. (FOR ICD-10 E10.9, E11.9).   furosemide 40 MG tablet Commonly known  as: LASIX Take 1 tablet (40 mg total) by mouth daily.   lactulose 10 GM/15ML solution Commonly known as: CHRONULAC Take 45 mLs (30 g total) by mouth 3 (three) times daily. What changed: how much to take   metFORMIN 850 MG tablet Commonly known as: GLUCOPHAGE TAKE 1 TABLET(850 MG) BY MOUTH TWICE DAILY WITH A MEAL What changed: See the new instructions.   nitroGLYCERIN 0.4 MG SL tablet Commonly known as: NITROSTAT Place 1 tablet (0.4 mg total) under the tongue every 5 (five) minutes as needed for chest pain.   pantoprazole 40 MG tablet Commonly known as: PROTONIX Take 1 tablet (40 mg total) by mouth daily.   rifaximin 550 MG Tabs tablet Commonly  known as: XIFAXAN Take 1 tablet (550 mg total) by mouth 2 (two) times daily.   spironolactone 50 MG tablet Commonly known as: ALDACTONE Take 1 tablet (50 mg total) by mouth daily. Start taking on: March 07, 2021        Allergies  Allergen Reactions   Fluzone Quadrivalent [Influenza Vac Split Quad] Other (See Comments)    The patient stated, in 10/2020: "I am not taking any more flu shots. It liked to have killed me."    Other Procedures/Studies: No results found.   TODAY-DAY OF DISCHARGE:  Subjective:   [name] today has no headache,no chest abdominal pain,no new weakness tingling or numbness, feels much better wants to go home today.   Objective:   Blood pressure (!) 139/54, pulse 73, temperature 98.6 F (37 C), temperature source Oral, resp. rate 18, height '5\' 8"'  (1.727 m), weight 108.9 kg, SpO2 97 %. No intake or output data in the 24 hours ending 03/06/21 1117 Filed Weights   03/04/21 1254  Weight: 108.9 kg    Exam: Awake Alert, Oriented *3, No new F.N deficits, Normal affect .AT,PERRAL Supple Neck,No JVD, No cervical lymphadenopathy appriciated.  Symmetrical Chest wall movement, Good air movement bilaterally, CTAB RRR,No Gallops,Rubs or new Murmurs, No Parasternal Heave +ve B.Sounds, Abd Soft, Non tender, No organomegaly appriciated, No rebound -guarding or rigidity. No Cyanosis, Clubbing  No new Rash or bruise   PERTINENT RADIOLOGIC STUDIES: No results found.   PERTINENT LAB RESULTS: CBC: Recent Labs    03/05/21 0044 03/06/21 0124  WBC 1.6* 2.2*  HGB 7.6* 8.2*  HCT 25.7* 27.3*  PLT 92* 97*   CMET CMP     Component Value Date/Time   NA 139 03/06/2021 0124   NA 139 08/25/2020 1420   K 3.8 03/06/2021 0124   CL 110 03/06/2021 0124   CO2 24 03/06/2021 0124   GLUCOSE 130 (H) 03/06/2021 0124   BUN 9 03/06/2021 0124   BUN 26 08/25/2020 1420   CREATININE 0.88 03/06/2021 0124   CREATININE 0.75 06/09/2015 1609   CALCIUM 8.5 (L) 03/06/2021  0124   PROT 6.0 (L) 03/06/2021 0124   ALBUMIN 2.6 (L) 03/06/2021 0124   AST 35 03/06/2021 0124   ALT 23 03/06/2021 0124   ALKPHOS 147 (H) 03/06/2021 0124   BILITOT 1.9 (H) 03/06/2021 0124   GFRNONAA >60 03/06/2021 0124   GFRAA 59 (L) 08/25/2020 1420    GFR Estimated Creatinine Clearance: 88.1 mL/min (by C-G formula based on SCr of 0.88 mg/dL). No results for input(s): LIPASE, AMYLASE in the last 72 hours. No results for input(s): CKTOTAL, CKMB, CKMBINDEX, TROPONINI in the last 72 hours. Invalid input(s): POCBNP No results for input(s): DDIMER in the last 72 hours. No results for input(s): HGBA1C in the last 72  hours. No results for input(s): CHOL, HDL, LDLCALC, TRIG, CHOLHDL, LDLDIRECT in the last 72 hours. No results for input(s): TSH, T4TOTAL, T3FREE, THYROIDAB in the last 72 hours.  Invalid input(s): FREET3 No results for input(s): VITAMINB12, FOLATE, FERRITIN, TIBC, IRON, RETICCTPCT in the last 72 hours. Coags: Recent Labs    03/04/21 1850  INR 1.4*   Microbiology: Recent Results (from the past 240 hour(s))  Resp Panel by RT-PCR (Flu A&B, Covid) Nasopharyngeal Swab     Status: None   Collection Time: 02/27/21  7:51 PM   Specimen: Nasopharyngeal Swab; Nasopharyngeal(NP) swabs in vial transport medium  Result Value Ref Range Status   SARS Coronavirus 2 by RT PCR NEGATIVE NEGATIVE Final    Comment: (NOTE) SARS-CoV-2 target nucleic acids are NOT DETECTED.  The SARS-CoV-2 RNA is generally detectable in upper respiratory specimens during the acute phase of infection. The lowest concentration of SARS-CoV-2 viral copies this assay can detect is 138 copies/mL. A negative result does not preclude SARS-Cov-2 infection and should not be used as the sole basis for treatment or other patient management decisions. A negative result may occur with  improper specimen collection/handling, submission of specimen other than nasopharyngeal swab, presence of viral mutation(s) within  the areas targeted by this assay, and inadequate number of viral copies(<138 copies/mL). A negative result must be combined with clinical observations, patient history, and epidemiological information. The expected result is Negative.  Fact Sheet for Patients:  EntrepreneurPulse.com.au  Fact Sheet for Healthcare Providers:  IncredibleEmployment.be  This test is no t yet approved or cleared by the [place] FDA and  has been authorized for detection and/or diagnosis of SARS-CoV-2 by FDA under an Emergency Use Authorization (EUA). This EUA will remain  in effect (meaning this test can be used) for the duration of the COVID-19 declaration under Section 564(b)(1) of the Act, 21 U.S.C.section 360bbb-3(b)(1), unless the authorization is terminated  or revoked sooner.       Influenza A by PCR NEGATIVE NEGATIVE Final   Influenza B by PCR NEGATIVE NEGATIVE Final    Comment: (NOTE) The Xpert Xpress SARS-CoV-2/FLU/RSV plus assay is intended as an aid in the diagnosis of influenza from Nasopharyngeal swab specimens and should not be used as a sole basis for treatment. Nasal washings and aspirates are unacceptable for Xpert Xpress SARS-CoV-2/FLU/RSV testing.  Fact Sheet for Patients: EntrepreneurPulse.com.au  Fact Sheet for Healthcare Providers: IncredibleEmployment.be  This test is not yet approved or cleared by the [place] FDA and has been authorized for detection and/or diagnosis of SARS-CoV-2 by FDA under an Emergency Use Authorization (EUA). This EUA will remain in effect (meaning this test can be used) for the duration of the COVID-19 declaration under Section 564(b)(1) of the Act, 21 U.S.C. section 360bbb-3(b)(1), unless the authorization is terminated or revoked.  Performed at [facility], [place] [address] [place], [place] [address]   MRSA Next Gen by PCR, Nasal     Status:  None   Collection Time: 02/28/21 10:53 AM   Specimen: Nasal Mucosa; Nasal Swab  Result Value Ref Range Status   MRSA by PCR Next Gen NOT DETECTED NOT DETECTED Final    Comment: (NOTE) The GeneXpert MRSA Assay (FDA approved for NASAL specimens only), is one component of a comprehensive MRSA colonization surveillance program. It is not intended to diagnose MRSA infection nor to guide or monitor treatment for MRSA infections. Test performance is not FDA approved in patients less than 67 years old. Performed at [facility], [place] [address] [place], [place] [address]   Resp Panel by RT-PCR (Flu A&B, Covid) Nasopharyngeal Swab     Status: None   Collection Time: 03/04/21  1:27 PM   Specimen: Nasopharyngeal Swab; Nasopharyngeal(NP) swabs in vial transport medium  Result Value Ref Range Status   SARS Coronavirus 2 by RT PCR NEGATIVE NEGATIVE Final    Comment: (NOTE) SARS-CoV-2 target nucleic acids are NOT DETECTED.  The SARS-CoV-2 RNA is generally detectable in upper respiratory specimens during the acute phase of infection. The lowest concentration of SARS-CoV-2 viral copies this assay can detect is 138 copies/mL. A negative result does not preclude SARS-Cov-2 infection and should not be used as the sole basis for treatment or other patient management decisions. A negative result may occur with  improper specimen collection/handling, submission of specimen other than nasopharyngeal swab, presence of viral mutation(s) within the areas targeted by this assay, and inadequate number of viral copies(<138 copies/mL). A negative result must be combined with clinical observations, patient history, and epidemiological information. The expected result is Negative.  Fact Sheet for Patients:  EntrepreneurPulse.com.au  Fact Sheet for Healthcare Providers:  IncredibleEmployment.be  This test is no t yet approved or cleared by the [place] FDA and  has been authorized for detection and/or diagnosis of SARS-CoV-2 by FDA under an Emergency Use Authorization (EUA). This EUA will remain  in effect (meaning this test can be used) for the duration of the COVID-19 declaration under Section 564(b)(1) of the Act, 21 U.S.C.section 360bbb-3(b)(1), unless the authorization is terminated  or revoked sooner.       Influenza A by PCR NEGATIVE NEGATIVE Final   Influenza B by PCR NEGATIVE NEGATIVE Final    Comment: (NOTE) The Xpert Xpress SARS-CoV-2/FLU/RSV plus assay is intended as an aid in the diagnosis of influenza from Nasopharyngeal swab specimens and should not be used as a sole basis for treatment. Nasal washings and aspirates are unacceptable for Xpert Xpress SARS-CoV-2/FLU/RSV testing.  Fact Sheet for Patients: EntrepreneurPulse.com.au  Fact Sheet for Healthcare Providers: IncredibleEmployment.be  This test is not yet approved or cleared by the [place] FDA and has been authorized for detection and/or diagnosis of SARS-CoV-2 by FDA under an Emergency Use Authorization (EUA). This EUA will remain in effect (meaning this test can be used) for the duration of the COVID-19 declaration under Section 564(b)(1) of the Act, 21 U.S.C. section 360bbb-3(b)(1), unless the authorization is terminated or revoked.  Performed at [facility], [place] [address] [place],  [address]     FURTHER DISCHARGE INSTRUCTIONS:  Get Medicines reviewed and adjusted: Please take all your medications with you for your next visit with your Primary MD  Laboratory/radiological data: Please request your Primary MD to go over all hospital tests and procedure/radiological results at the follow up, please ask your Primary MD to get all Hospital records sent to his/her office.  In some cases, they will be blood work, cultures and biopsy results pending at the time of your discharge. Please  request that your primary care M.D. goes through all the records of your hospital data and follows up on these results.  Also Note the following: If you experience worsening of your admission symptoms, develop shortness of breath, life threatening emergency, suicidal or homicidal thoughts you must seek medical attention immediately by calling 911 or calling your MD immediately  if symptoms less severe.  You must read complete instructions/literature along with all the possible adverse reactions/side effects for all  the Medicines you take and that have been prescribed to you. Take any new Medicines after you have completely understood and accpet all the possible adverse reactions/side effects.   Do not drive when taking Pain medications or sleeping medications (Benzodaizepines)  Do not take more than prescribed Pain, Sleep and Anxiety Medications. It is not advisable to combine anxiety,sleep and pain medications without talking with your primary care practitioner  Special Instructions: If you have smoked or chewed Tobacco  in the last 2 yrs please stop smoking, stop any regular Alcohol  and or any Recreational drug use.  Wear Seat belts while driving.  Please note: You were cared for by a hospitalist during your hospital stay. Once you are discharged, your primary care physician will handle any further medical issues. Please note that NO REFILLS for any discharge medications will be authorized once you are discharged, as it is imperative that you return to your primary care physician (or establish a relationship with a primary care physician if you do not have one) for your post hospital discharge needs so that they can reassess your need for medications and monitor your lab values.  Total Time spent coordinating discharge including counseling, education and face to face time equals 35 minutes.  SignedOren Binet 03/06/2021 11:17 AM

## 2021-03-06 NOTE — Progress Notes (Signed)
[name] to be D/C'd Home per MD order.  Discussed with the patient and all questions answered.   VSS, Skin clean, dry and intact without evidence of skin break down, no evidence of skin tears noted. IV catheter discontinued intact. Site without signs and symptoms of complications. Dressing and pressure applied.   An After Visit Summary was printed and given to the patient. Patient received prescription pick up instructions.   D/c education completed with patient/family including follow up instructions, medication list, d/c activities limitations if indicated, with other d/c instructions as indicated by MD - patient able to verbalize understanding, all questions answered.    Patient instructed to return to ED, call 911, or call MD for any changes in condition.    Patient escorted via [place], and D/C home via private auto.                 Note Details  Author Dene Gentry, RN File Time 01/22/2021  1:26 PM  Author Type Registered Nurse Status Incomplete  Last Editor Dene Gentry, RN Service (none)  [place] # [id number] Admit Date 01/19/2021

## 2021-03-06 NOTE — TOC Transition Note (Signed)
Transition of Care [facility]) - CM/SW Discharge Note   Patient Details  Name: [name] MRN: [id number] Date of Birth: Jun 01, 1946  Transition of Care [facility]) CM/SW Contact:  Carles Collet, RN Phone Number: 03/06/2021, 12:34 PM   Clinical Narrative:    Damaris Schooner w patient on the phone, however he is very [place], and could not hear me so I called and spoke to his son. He states that he is active w [facility] and would like to continue services. Notified [facility] liaison of [place] for planning resumption of services. No DME needs. Son states he will provide transportation home   Final next level of care: [place] Barriers to Discharge: No Barriers Identified   Patient Goals and CMS Choice Patient states their goals for this hospitalization and ongoing recovery are:: to go back home CMS Medicare.gov Compare Post Acute Care list provided to:: Other (Comment Required) Choice offered to / list presented to : Adult Children  Discharge Placement                       Discharge Plan and Services                DME Arranged: N/A         HH Arranged: PT, OT HH Agency: [place] Date [place]: 03/06/21 Time [place]: 1233 Representative spoke with at [place]: [place] ([place]) Interventions     Readmission Risk Interventions Readmission Risk Prevention Plan 01/26/2021 08/12/2020 08/12/2020  Transportation Screening Complete - Complete  PCP or Specialist Appt within 5-7 Days - - -  Not Complete comments - - -  PCP or Specialist Appt within 3-5 Days - Complete -  Home Care Screening - - -  Medication Review (RN CM) - - -  [place] or [place] - - Complete  Social Work Consult for [place] Planning/Counseling - - Complete  Palliative Care Screening - - Not Applicable  Medication Review Press photographer) Complete - -  PCP or Specialist appointment within 3-5 days of discharge Complete - -  [place] or  [place] Complete - -  SW Recovery Care/Counseling Consult Complete - -  [place] Patient Refused - -  Some recent data might be hidden

## 2021-03-08 DIAGNOSIS — I130 Hypertensive heart and chronic kidney disease with heart failure and stage 1 through stage 4 chronic kidney disease, or unspecified chronic kidney disease: Secondary | ICD-10-CM | POA: Diagnosis not present

## 2021-03-08 DIAGNOSIS — Z9181 History of falling: Secondary | ICD-10-CM | POA: Diagnosis not present

## 2021-03-08 DIAGNOSIS — Z7984 Long term (current) use of oral hypoglycemic drugs: Secondary | ICD-10-CM | POA: Diagnosis not present

## 2021-03-08 DIAGNOSIS — E1122 Type 2 diabetes mellitus with diabetic chronic kidney disease: Secondary | ICD-10-CM | POA: Diagnosis not present

## 2021-03-08 DIAGNOSIS — N1831 Chronic kidney disease, stage 3a: Secondary | ICD-10-CM | POA: Diagnosis not present

## 2021-03-08 DIAGNOSIS — G9340 Encephalopathy, unspecified: Secondary | ICD-10-CM | POA: Diagnosis not present

## 2021-03-08 DIAGNOSIS — R69 Illness, unspecified: Secondary | ICD-10-CM | POA: Diagnosis not present

## 2021-03-08 DIAGNOSIS — I5032 Chronic diastolic (congestive) heart failure: Secondary | ICD-10-CM | POA: Diagnosis not present

## 2021-03-08 DIAGNOSIS — C220 Liver cell carcinoma: Secondary | ICD-10-CM | POA: Diagnosis not present

## 2021-03-26 ENCOUNTER — Encounter: Payer: Self-pay | Admitting: *Deleted

## 2021-03-26 ENCOUNTER — Telehealth: Payer: Self-pay | Admitting: *Deleted

## 2021-03-26 ENCOUNTER — Telehealth: Payer: Medicare HMO

## 2021-03-26 NOTE — Telephone Encounter (Signed)
  Chronic Care Management   Follow Up Note   03/26/2021 Name: [name] MRN: [id number] DOB: Aug 19, 1946   Referred by: [name], MD Reason for referral : Chronic Care Management (CCM RN CM Follow Up call; Unsuccessful attempt, CHF; anemia)  An unsuccessful telephone outreach was attempted today. The patient was referred to the case management team for assistance with care management and care coordination.   Follow Up Plan:  A HIPPA compliant phone message was left for the patient's caregiver/ son providing contact information and requesting a return call Will place request with Scheduling care guide to contact patient's son to re-schedule today's missed CCM RN telephone appointment  Oneta Rack, RN, BSN, [place] [phone]: direct office [phone]: mobile

## 2021-03-28 ENCOUNTER — Inpatient Hospital Stay (HOSPITAL_COMMUNITY)
Admission: EM | Admit: 2021-03-28 | Discharge: 2021-03-31 | DRG: 442 | Disposition: A | Discharge status: Home Health | Payer: Medicare HMO | Attending: Internal Medicine | Admitting: Internal Medicine

## 2021-03-28 ENCOUNTER — Emergency Department (HOSPITAL_COMMUNITY): Payer: Medicare HMO

## 2021-03-28 DIAGNOSIS — D62 Acute posthemorrhagic anemia: Secondary | ICD-10-CM | POA: Diagnosis not present

## 2021-03-28 DIAGNOSIS — G4733 Obstructive sleep apnea (adult) (pediatric): Secondary | ICD-10-CM | POA: Diagnosis present

## 2021-03-28 DIAGNOSIS — Z8249 Family history of ischemic heart disease and other diseases of the circulatory system: Secondary | ICD-10-CM

## 2021-03-28 DIAGNOSIS — K7031 Alcoholic cirrhosis of liver with ascites: Secondary | ICD-10-CM | POA: Diagnosis present

## 2021-03-28 DIAGNOSIS — J449 Chronic obstructive pulmonary disease, unspecified: Secondary | ICD-10-CM | POA: Diagnosis present

## 2021-03-28 DIAGNOSIS — D696 Thrombocytopenia, unspecified: Secondary | ICD-10-CM | POA: Diagnosis not present

## 2021-03-28 DIAGNOSIS — Z955 Presence of coronary angioplasty implant and graft: Secondary | ICD-10-CM

## 2021-03-28 DIAGNOSIS — Z8505 Personal history of malignant neoplasm of liver: Secondary | ICD-10-CM

## 2021-03-28 DIAGNOSIS — E785 Hyperlipidemia, unspecified: Secondary | ICD-10-CM | POA: Diagnosis present

## 2021-03-28 DIAGNOSIS — I130 Hypertensive heart and chronic kidney disease with heart failure and stage 1 through stage 4 chronic kidney disease, or unspecified chronic kidney disease: Secondary | ICD-10-CM | POA: Diagnosis present

## 2021-03-28 DIAGNOSIS — I2510 Atherosclerotic heart disease of native coronary artery without angina pectoris: Secondary | ICD-10-CM | POA: Diagnosis present

## 2021-03-28 DIAGNOSIS — K7210 Chronic hepatic failure without coma: Secondary | ICD-10-CM | POA: Diagnosis not present

## 2021-03-28 DIAGNOSIS — I864 Gastric varices: Secondary | ICD-10-CM | POA: Diagnosis not present

## 2021-03-28 DIAGNOSIS — E118 Type 2 diabetes mellitus with unspecified complications: Secondary | ICD-10-CM | POA: Diagnosis not present

## 2021-03-28 DIAGNOSIS — K922 Gastrointestinal hemorrhage, unspecified: Secondary | ICD-10-CM | POA: Diagnosis not present

## 2021-03-28 DIAGNOSIS — D649 Anemia, unspecified: Secondary | ICD-10-CM | POA: Diagnosis not present

## 2021-03-28 DIAGNOSIS — I517 Cardiomegaly: Secondary | ICD-10-CM | POA: Diagnosis not present

## 2021-03-28 DIAGNOSIS — Z20822 Contact with and (suspected) exposure to covid-19: Secondary | ICD-10-CM | POA: Diagnosis not present

## 2021-03-28 DIAGNOSIS — I119 Hypertensive heart disease without heart failure: Secondary | ICD-10-CM | POA: Diagnosis present

## 2021-03-28 DIAGNOSIS — M109 Gout, unspecified: Secondary | ICD-10-CM | POA: Diagnosis present

## 2021-03-28 DIAGNOSIS — R4182 Altered mental status, unspecified: Secondary | ICD-10-CM | POA: Diagnosis not present

## 2021-03-28 DIAGNOSIS — R1900 Intra-abdominal and pelvic swelling, mass and lump, unspecified site: Secondary | ICD-10-CM | POA: Diagnosis not present

## 2021-03-28 DIAGNOSIS — I5030 Unspecified diastolic (congestive) heart failure: Secondary | ICD-10-CM | POA: Diagnosis present

## 2021-03-28 DIAGNOSIS — K219 Gastro-esophageal reflux disease without esophagitis: Secondary | ICD-10-CM | POA: Diagnosis present

## 2021-03-28 DIAGNOSIS — I5032 Chronic diastolic (congestive) heart failure: Secondary | ICD-10-CM | POA: Diagnosis present

## 2021-03-28 DIAGNOSIS — K766 Portal hypertension: Secondary | ICD-10-CM | POA: Diagnosis not present

## 2021-03-28 DIAGNOSIS — Y92009 Unspecified place in unspecified non-institutional (private) residence as the place of occurrence of the external cause: Secondary | ICD-10-CM

## 2021-03-28 DIAGNOSIS — Z87442 Personal history of urinary calculi: Secondary | ICD-10-CM

## 2021-03-28 DIAGNOSIS — F1010 Alcohol abuse, uncomplicated: Secondary | ICD-10-CM | POA: Diagnosis present

## 2021-03-28 DIAGNOSIS — Z800 Family history of malignant neoplasm of digestive organs: Secondary | ICD-10-CM

## 2021-03-28 DIAGNOSIS — I4891 Unspecified atrial fibrillation: Secondary | ICD-10-CM | POA: Diagnosis not present

## 2021-03-28 DIAGNOSIS — R69 Illness, unspecified: Secondary | ICD-10-CM | POA: Diagnosis not present

## 2021-03-28 DIAGNOSIS — Z833 Family history of diabetes mellitus: Secondary | ICD-10-CM | POA: Diagnosis not present

## 2021-03-28 DIAGNOSIS — K3189 Other diseases of stomach and duodenum: Secondary | ICD-10-CM | POA: Diagnosis present

## 2021-03-28 DIAGNOSIS — N183 Chronic kidney disease, stage 3 unspecified: Secondary | ICD-10-CM | POA: Diagnosis present

## 2021-03-28 DIAGNOSIS — Z87891 Personal history of nicotine dependence: Secondary | ICD-10-CM | POA: Diagnosis not present

## 2021-03-28 DIAGNOSIS — K7290 Hepatic failure, unspecified without coma: Secondary | ICD-10-CM

## 2021-03-28 DIAGNOSIS — E1122 Type 2 diabetes mellitus with diabetic chronic kidney disease: Secondary | ICD-10-CM | POA: Diagnosis not present

## 2021-03-28 DIAGNOSIS — K7200 Acute and subacute hepatic failure without coma: Secondary | ICD-10-CM | POA: Diagnosis not present

## 2021-03-28 DIAGNOSIS — I10 Essential (primary) hypertension: Secondary | ICD-10-CM | POA: Diagnosis not present

## 2021-03-28 DIAGNOSIS — R531 Weakness: Secondary | ICD-10-CM | POA: Diagnosis not present

## 2021-03-28 DIAGNOSIS — Z951 Presence of aortocoronary bypass graft: Secondary | ICD-10-CM | POA: Diagnosis not present

## 2021-03-28 DIAGNOSIS — T366X6A Underdosing of rifampicins, initial encounter: Secondary | ICD-10-CM | POA: Diagnosis present

## 2021-03-28 DIAGNOSIS — K7460 Unspecified cirrhosis of liver: Secondary | ICD-10-CM | POA: Diagnosis present

## 2021-03-28 DIAGNOSIS — C220 Liver cell carcinoma: Secondary | ICD-10-CM | POA: Diagnosis not present

## 2021-03-28 DIAGNOSIS — Z887 Allergy status to serum and vaccine status: Secondary | ICD-10-CM

## 2021-03-28 DIAGNOSIS — Z79899 Other long term (current) drug therapy: Secondary | ICD-10-CM

## 2021-03-28 DIAGNOSIS — K7682 Hepatic encephalopathy: Secondary | ICD-10-CM | POA: Diagnosis present

## 2021-03-28 DIAGNOSIS — Z7984 Long term (current) use of oral hypoglycemic drugs: Secondary | ICD-10-CM

## 2021-03-28 DIAGNOSIS — K7030 Alcoholic cirrhosis of liver without ascites: Secondary | ICD-10-CM | POA: Diagnosis not present

## 2021-03-28 DIAGNOSIS — Z87820 Personal history of traumatic brain injury: Secondary | ICD-10-CM

## 2021-03-28 DIAGNOSIS — G9340 Encephalopathy, unspecified: Secondary | ICD-10-CM | POA: Diagnosis not present

## 2021-03-28 DIAGNOSIS — N1831 Chronic kidney disease, stage 3a: Secondary | ICD-10-CM | POA: Diagnosis not present

## 2021-03-28 DIAGNOSIS — R609 Edema, unspecified: Secondary | ICD-10-CM | POA: Diagnosis not present

## 2021-03-28 DIAGNOSIS — Z801 Family history of malignant neoplasm of trachea, bronchus and lung: Secondary | ICD-10-CM

## 2021-03-28 DIAGNOSIS — T473X6A Underdosing of saline and osmotic laxatives, initial encounter: Secondary | ICD-10-CM | POA: Diagnosis present

## 2021-03-28 DIAGNOSIS — R404 Transient alteration of awareness: Secondary | ICD-10-CM | POA: Diagnosis not present

## 2021-03-28 LAB — I-STAT VENOUS BLOOD GAS, ED
Acid-base deficit: 3 mmol/L — ABNORMAL HIGH (ref 0.0–2.0)
Bicarbonate: 20.5 mmol/L (ref 20.0–28.0)
Calcium, Ion: 1.14 mmol/L — ABNORMAL LOW (ref 1.15–1.40)
HCT: 20 % — ABNORMAL LOW (ref 39.0–52.0)
Hemoglobin: 6.8 g/dL — CL (ref 13.0–17.0)
O2 Saturation: 76 %
Potassium: 3.7 mmol/L (ref 3.5–5.1)
Sodium: 141 mmol/L (ref 135–145)
TCO2: 21 mmol/L — ABNORMAL LOW (ref 22–32)
pCO2, Ven: 29.7 mmHg — ABNORMAL LOW (ref 44.0–60.0)
pH, Ven: 7.446 — ABNORMAL HIGH (ref 7.250–7.430)
pO2, Ven: 38 mmHg (ref 32.0–45.0)

## 2021-03-28 LAB — COMPREHENSIVE METABOLIC PANEL
ALT: 22 U/L (ref 0–44)
AST: 30 U/L (ref 15–41)
Albumin: 2.8 g/dL — ABNORMAL LOW (ref 3.5–5.0)
Alkaline Phosphatase: 119 U/L (ref 38–126)
Anion gap: 7 (ref 5–15)
BUN: 16 mg/dL (ref 8–23)
CO2: 21 mmol/L — ABNORMAL LOW (ref 22–32)
Calcium: 8.7 mg/dL — ABNORMAL LOW (ref 8.9–10.3)
Chloride: 109 mmol/L (ref 98–111)
Creatinine, Ser: 1.02 mg/dL (ref 0.61–1.24)
GFR, Estimated: >60 mL/min (ref >60)
Glucose, Bld: 106 mg/dL — ABNORMAL HIGH (ref 70–99)
Potassium: 3.7 mmol/L (ref 3.5–5.1)
Sodium: 137 mmol/L (ref 135–145)
Total Bilirubin: 1.6 mg/dL — ABNORMAL HIGH (ref 0.3–1.2)
Total Protein: 6.4 g/dL — ABNORMAL LOW (ref 6.5–8.1)

## 2021-03-28 LAB — PROTIME-INR
INR: 1.4 — ABNORMAL HIGH (ref 0.8–1.2)
Prothrombin Time: 17.1 seconds — ABNORMAL HIGH (ref 11.4–15.2)

## 2021-03-28 LAB — RESP PANEL BY RT-PCR (FLU A&B, COVID) ARPGX2
Influenza A by PCR: NEGATIVE
Influenza B by PCR: NEGATIVE
SARS Coronavirus 2 by RT PCR: NEGATIVE

## 2021-03-28 LAB — ETHANOL: Alcohol, Ethyl (B): <10 mg/dL (ref <10)

## 2021-03-28 LAB — CBC WITH DIFFERENTIAL/PLATELET
Abs Immature Granulocytes: 0.01 10*3/uL (ref 0.00–0.07)
Basophils Absolute: 0 10*3/uL (ref 0.0–0.1)
Basophils Relative: 0 %
Eosinophils Absolute: 0 10*3/uL (ref 0.0–0.5)
Eosinophils Relative: 1 %
HCT: 21.5 % — ABNORMAL LOW (ref 39.0–52.0)
Hemoglobin: 6.4 g/dL — CL (ref 13.0–17.0)
Immature Granulocytes: 0 %
Lymphocytes Relative: 16 %
Lymphs Abs: 0.4 10*3/uL — ABNORMAL LOW (ref 0.7–4.0)
MCH: 23 pg — ABNORMAL LOW (ref 26.0–34.0)
MCHC: 29.8 g/dL — ABNORMAL LOW (ref 30.0–36.0)
MCV: 77.3 fL — ABNORMAL LOW (ref 80.0–100.0)
Monocytes Absolute: 0.2 10*3/uL (ref 0.1–1.0)
Monocytes Relative: 9 %
Neutro Abs: 1.7 10*3/uL (ref 1.7–7.7)
Neutrophils Relative %: 74 %
Platelets: 106 10*3/uL — ABNORMAL LOW (ref 150–400)
RBC: 2.78 MIL/uL — ABNORMAL LOW (ref 4.22–5.81)
RDW: 20.6 % — ABNORMAL HIGH (ref 11.5–15.5)
WBC: 2.3 10*3/uL — ABNORMAL LOW (ref 4.0–10.5)
nRBC: 0 % (ref 0.0–0.2)

## 2021-03-28 LAB — CBG MONITORING, ED
Glucose-Capillary: 97 mg/dL (ref 70–99)
Glucose-Capillary: 92 mg/dL (ref 70–99)

## 2021-03-28 LAB — AMMONIA: Ammonia: 91 umol/L — ABNORMAL HIGH (ref 9–35)

## 2021-03-28 LAB — HEMOGLOBIN A1C
Hgb A1c MFr Bld: 5 % (ref 4.8–5.6)
Mean Plasma Glucose: 96.8 mg/dL

## 2021-03-28 LAB — PREPARE RBC (CROSSMATCH)

## 2021-03-28 LAB — LACTIC ACID, PLASMA: Lactic Acid, Venous: 1.5 mmol/L (ref 0.5–1.9)

## 2021-03-28 MED ORDER — ONDANSETRON HCL 4 MG/2ML IJ SOLN: 4.0000 mg | Freq: Four times a day (QID) | INTRAMUSCULAR | Status: DC | PRN

## 2021-03-28 MED ORDER — PANTOPRAZOLE SODIUM 40 MG IV SOLR
40.0000 mg | Freq: Once | INTRAVENOUS | Status: AC
  Administered 2021-03-28: 40 mg via INTRAVENOUS
  Filled 2021-03-28: qty 40

## 2021-03-28 MED ORDER — SPIRONOLACTONE 25 MG PO TABS
50.0000 mg | ORAL_TABLET | Freq: Every day | ORAL | Status: DC
  Administered 2021-03-29 – 2021-03-31 (×3): 50 mg via ORAL
  Filled 2021-03-28 (×3): qty 2

## 2021-03-28 MED ORDER — INSULIN ASPART 100 UNIT/ML IJ SOLN
0.0000 [IU] | INTRAMUSCULAR | Status: DC
Start: 2021-03-28 — End: 2021-03-31
  Administered 2021-03-29: 2 [IU] via SUBCUTANEOUS
  Administered 2021-03-30 (×3): 3 [IU] via SUBCUTANEOUS
  Administered 2021-03-31: 2 [IU] via SUBCUTANEOUS

## 2021-03-28 MED ORDER — UMECLIDINIUM-VILANTEROL 62.5-25 MCG/INH IN AEPB
1.0000 | INHALATION_SPRAY | Freq: Every day | RESPIRATORY_TRACT | Status: DC
  Administered 2021-03-30 – 2021-03-31 (×2): 1 via RESPIRATORY_TRACT
  Filled 2021-03-28: qty 14

## 2021-03-28 MED ORDER — FUROSEMIDE 40 MG PO TABS
40.0000 mg | ORAL_TABLET | Freq: Every day | ORAL | Status: DC
  Administered 2021-03-29 – 2021-03-31 (×3): 40 mg via ORAL
  Filled 2021-03-28 (×3): qty 1

## 2021-03-28 MED ORDER — LACTULOSE 10 GM/15ML PO SOLN
30.0000 g | Freq: Three times a day (TID) | ORAL | Status: DC
  Administered 2021-03-28 – 2021-03-31 (×9): 30 g via ORAL
  Filled 2021-03-28 (×2): qty 45
  Filled 2021-03-28: qty 60
  Filled 2021-03-28 (×2): qty 45
  Filled 2021-03-28: qty 60
  Filled 2021-03-28 (×3): qty 45

## 2021-03-28 MED ORDER — ALBUTEROL SULFATE HFA 108 (90 BASE) MCG/ACT IN AERS: 2.0000 | INHALATION_SPRAY | Freq: Four times a day (QID) | RESPIRATORY_TRACT | Status: DC | PRN

## 2021-03-28 MED ORDER — SODIUM CHLORIDE 0.9 % IV SOLN
1.0000 g | INTRAVENOUS | Status: DC
  Administered 2021-03-28: 1 g via INTRAVENOUS
  Filled 2021-03-28 (×2): qty 10

## 2021-03-28 MED ORDER — SODIUM CHLORIDE 0.9% FLUSH
3.0000 mL | Freq: Two times a day (BID) | INTRAVENOUS | Status: DC
  Administered 2021-03-29 – 2021-03-31 (×3): 3 mL via INTRAVENOUS

## 2021-03-28 MED ORDER — ALBUTEROL SULFATE (2.5 MG/3ML) 0.083% IN NEBU: 2.5000 mg | INHALATION_SOLUTION | RESPIRATORY_TRACT | Status: DC | PRN

## 2021-03-28 MED ORDER — RIFAXIMIN 550 MG PO TABS
550.0000 mg | ORAL_TABLET | Freq: Two times a day (BID) | ORAL | Status: DC
  Administered 2021-03-28 – 2021-03-31 (×6): 550 mg via ORAL
  Filled 2021-03-28 (×8): qty 1

## 2021-03-28 MED ORDER — ONDANSETRON HCL 4 MG PO TABS: 4.0000 mg | ORAL_TABLET | Freq: Four times a day (QID) | ORAL | Status: DC | PRN

## 2021-03-28 MED ORDER — SODIUM CHLORIDE 0.9% IV SOLUTION: Freq: Once | INTRAVENOUS | Status: AC

## 2021-03-28 NOTE — ED Provider Notes (Signed)
[facility] EMERGENCY DEPARTMENT Provider Note   CSN: [id number] Arrival date & time: 03/28/21  1657     History No chief complaint on file.   [name] is a 75 y.o. male.  75 yo M with a chief complaints of altered mental status.  Per EMS the patient has not been taking his medications for about 3 days now.  Normally is on rifaximin and lactulose.  The patient is confused on exam and has trouble answering further questions he tells me is not taking his medications because he sick.  Has some trouble elaborating on that point.  Does think that he has been coughing.  Denies nausea vomiting or diarrhea denies fevers denies headache.  Level 5 caveat altered mental status  The history is provided by the patient and the EMS personnel.      Past Medical History:  Diagnosis Date   Arthritis    "joints tighten up sometimes" (03/27/2104)   Carcinoma of liver, hepatocellular (HCC) 06/30/2020   Chronic lower back pain    Coronary artery disease involving native coronary artery of native heart without angina pectoris    Severe left main disease at catheterization July 2015  CABG x3 with a LIMA to the LAD, SVG to the OM, SVG to the PDA on 03/31/14. EF 60% by cath.    Diastolic heart failure (HCC)    GERD without esophagitis 08/04/2010   Hepatic cirrhosis (HCC)    a. Dx 01/2014 - CT a/p    History of blood transfusion    "related to bleeding ulcers"   History of concussion    1976--  NO RESIDUAL   History of GI bleed    a. UGIB 07/2012;  b. 01/2014 admission with GIB/FOB stool req 1U prbc's->EGD showed portal gastropathy, barrett's esoph, and chronic active h. pylori gastritis.   History of gout    2007 &  2008  LEFT LEG-- NO ISSUE SINCE   Hyperlipidemia    Iron deficiency anemia    Kidney stones    OA (osteoarthritis of spine)    LOWER BACK--  INTERMITTANT LEFT LEG NUMBNESS   OSA (obstructive sleep apnea)    PULMOLOGIST-  DR CLANCE--  MODERATE OSA  STARTED CPAP 2012--   BUT CURRENTLY HAS NOT USED PAST 6 MONTHS   Phimosis    a. s/p circumcision 2015.   Type 2 diabetes mellitus (HCC)    Unspecified essential hypertension     Patient Active Problem List   Diagnosis Date Noted   Acute GI bleeding 02/27/2021   CKD stage 3 due to type 2 diabetes mellitus (HCC) 02/27/2021   Hepatic encephalopathy (HCC) 01/22/2021   GI bleeding 09/10/2020   Hepatocellular carcinoma (HCC) 08/09/2020   Routine general medical examination at a health care facility 05/06/2020   Iron deficiency anemia    Symptomatic anemia 04/15/2020   Pulmonary hypertension, unspecified (HCC) 03/11/2020   Asthma, mild intermittent 12/27/2019   Alcoholic cirrhosis of liver with ascites (HCC) 12/27/2019   (HFpEF) heart failure with preserved ejection fraction (HCC) 12/24/2018   CKD (chronic kidney disease), stage III (HCC) 12/18/2017   Peptic ulcer disease 12/18/2017   GI bleed 09/29/2017   Class 2 obesity due to excess calories with body mass index (BMI) of 36.0 to 36.9 in adult 02/05/2016   S/P CABG x 3 03/31/2014   Hepatic cirrhosis (HCC) 02/27/2014   Melena 08/14/2012   Hyperlipidemia with target LDL less than 70 06/29/2012   Thrombocytopenia (HCC) 06/29/2012     Coronary artery disease involving native coronary artery of native heart without angina pectoris    OSA (obstructive sleep apnea)    Type 2 diabetes with complication (HCC) 08/04/2010   GERD without esophagitis 08/04/2010   History of gout    Hypertensive heart disease     Past Surgical History:  Procedure Laterality Date   ANKLE FRACTURE SURGERY Right 1989   "plate put in"   APPENDECTOMY  05-16-2004   open   BIOPSY  01/08/2021   Procedure: BIOPSY;  Surgeon: Schooler, Vincent, MD;  Location: [facility];  Service: Endoscopy;;   CIRCUMCISION N/A 09/09/2013   Procedure: CIRCUMCISION ADULT;  Surgeon: David S Grapey, MD;  Location: [facility];  Service: Urology;  Laterality: N/A;   COLECTOMY  05-16-2004    COLONOSCOPY WITH PROPOFOL N/A 11/20/2020   Procedure: COLONOSCOPY WITH PROPOFOL;  Surgeon: Buccini, Robert, MD;  Location: [facility];  Service: Endoscopy;  Laterality: N/A;   CORONARY ANGIOPLASTY WITH STENT PLACEMENT  06/28/2012  DR COOPER   PCI W/  X1 DES to PROX. LAD/  LM  40% OSTIAL & 50-60% DISTAL /  50% PROX LCX/  30-40% PROX RCA & 50% MID RCA/   LVEF 65-70%   CORONARY ARTERY BYPASS GRAFT N/A 03/31/2014   Procedure: CORONARY ARTERY BYPASS GRAFTING (CABG) times 3 using left internal mammary artery and right saphenous vein.;  Surgeon: Steven C Hendrickson, MD;  Location: [facility];  Service: Open Heart Surgery;  Laterality: N/A;   DOBUTAMINE STRESS ECHO  06-08-2012   MODERATE HYPOKINESIS/ ISCHEMIA MID INFERIOR WALL   ESOPHAGOGASTRODUODENOSCOPY  08/15/2012   Procedure: ESOPHAGOGASTRODUODENOSCOPY (EGD);  Surgeon: Salem F Ganem, MD;  Location: [facility];  Service: Endoscopy;  Laterality: N/A;   ESOPHAGOGASTRODUODENOSCOPY N/A 02/17/2014   Procedure: ESOPHAGOGASTRODUODENOSCOPY (EGD);  Surgeon: Marc E Magod, MD;  Location: [facility];  Service: Endoscopy;  Laterality: N/A;   ESOPHAGOGASTRODUODENOSCOPY N/A 04/17/2020   Procedure: ESOPHAGOGASTRODUODENOSCOPY (EGD);  Surgeon: Karki, Arya, MD;  Location: [facility];  Service: Gastroenterology;  Laterality: N/A;   ESOPHAGOGASTRODUODENOSCOPY N/A 09/11/2020   Procedure: ESOPHAGOGASTRODUODENOSCOPY (EGD);  Surgeon: Magod, Marc, MD;  Location: [facility];  Service: Endoscopy;  Laterality: N/A;   ESOPHAGOGASTRODUODENOSCOPY  10/09/2020   ESOPHAGOGASTRODUODENOSCOPY N/A 10/09/2020   Procedure: ESOPHAGOGASTRODUODENOSCOPY (EGD);  Surgeon: Brahmbhatt, Parag, MD;  Location: [facility];  Service: Gastroenterology;  Laterality: N/A;   ESOPHAGOGASTRODUODENOSCOPY N/A 01/08/2021   Procedure: ESOPHAGOGASTRODUODENOSCOPY (EGD);  Surgeon: Schooler, Vincent, MD;  Location: [facility];  Service: Endoscopy;  Laterality: N/A;   ESOPHAGOGASTRODUODENOSCOPY (EGD) WITH PROPOFOL  N/A 09/30/2017   Procedure: ESOPHAGOGASTRODUODENOSCOPY (EGD) WITH PROPOFOL;  Surgeon: Ganem, Salem F, MD;  Location: [facility];  Service: Endoscopy;  Laterality: N/A;   ESOPHAGOGASTRODUODENOSCOPY (EGD) WITH PROPOFOL N/A 12/20/2017   Procedure: ESOPHAGOGASTRODUODENOSCOPY (EGD) WITH PROPOFOL;  Surgeon: Magod, Marc, MD;  [facility]: [facility];  Service: Endoscopy;  Laterality: N/A;   ESOPHAGOGASTRODUODENOSCOPY (EGD) WITH PROPOFOL N/A 08/10/2020   Procedure: ESOPHAGOGASTRODUODENOSCOPY (EGD) WITH PROPOFOL;  Surgeon: Schooler, Vincent, MD;  Location: [facility];  Service: Endoscopy;  Laterality: N/A;   ESOPHAGOGASTRODUODENOSCOPY (EGD) WITH PROPOFOL N/A 11/20/2020   Procedure: ESOPHAGOGASTRODUODENOSCOPY (EGD) WITH PROPOFOL;  Surgeon: Buccini, Robert, MD;  Location: [facility];  Service: Endoscopy;  Laterality: N/A;   ESOPHAGOGASTRODUODENOSCOPY (EGD) WITH PROPOFOL N/A 02/28/2021   Procedure: ESOPHAGOGASTRODUODENOSCOPY (EGD) WITH PROPOFOL;  Surgeon: Magod, Marc, MD;  Location: [facility];  Service: Endoscopy;  Laterality: N/A;   GIVENS CAPSULE STUDY N/A 11/20/2020   Procedure: GIVENS CAPSULE STUDY;  Surgeon: Buccini, Robert, MD;  Location: [facility];    Service: Endoscopy;  Laterality: N/A;   HERNIA REPAIR     INTRAOPERATIVE TRANSESOPHAGEAL ECHOCARDIOGRAM N/A 03/31/2014   Procedure: INTRAOPERATIVE TRANSESOPHAGEAL ECHOCARDIOGRAM;  Surgeon: Steven C Hendrickson, MD;  Location: [facility];  Service: Open Heart Surgery;  Laterality: N/A;   IR ANGIOGRAM SELECTIVE EACH ADDITIONAL VESSEL  06/16/2020   IR ANGIOGRAM SELECTIVE EACH ADDITIONAL VESSEL  06/16/2020   IR ANGIOGRAM SELECTIVE EACH ADDITIONAL VESSEL  06/16/2020   IR ANGIOGRAM SELECTIVE EACH ADDITIONAL VESSEL  06/16/2020   IR ANGIOGRAM SELECTIVE EACH ADDITIONAL VESSEL  06/16/2020   IR ANGIOGRAM SELECTIVE EACH ADDITIONAL VESSEL  06/16/2020   IR ANGIOGRAM SELECTIVE EACH ADDITIONAL VESSEL  06/16/2020   IR ANGIOGRAM SELECTIVE EACH ADDITIONAL VESSEL  06/16/2020    IR ANGIOGRAM SELECTIVE EACH ADDITIONAL VESSEL  06/16/2020   IR ANGIOGRAM SELECTIVE EACH ADDITIONAL VESSEL  06/30/2020   IR ANGIOGRAM SELECTIVE EACH ADDITIONAL VESSEL  06/30/2020   IR ANGIOGRAM VISCERAL SELECTIVE  06/16/2020   IR ANGIOGRAM VISCERAL SELECTIVE  06/30/2020   IR EMBO ARTERIAL NOT HEMORR HEMANG INC GUIDE ROADMAPPING  06/16/2020   IR EMBO TUMOR ORGAN ISCHEMIA INFARCT INC GUIDE ROADMAPPING  06/30/2020   IR EMBO VENOUS NOT HEMORR HEMANG  INC GUIDE ROADMAPPING  01/13/2021   IR IVUS EACH ADDITIONAL NON CORONARY VESSEL  01/13/2021   IR RADIOLOGIST EVAL & MGMT  05/06/2020   IR RADIOLOGIST EVAL & MGMT  05/20/2020   IR TIPS  01/13/2021   IR US GUIDE VASC ACCESS RIGHT  06/16/2020   IR US GUIDE VASC ACCESS RIGHT  06/30/2020   IR US GUIDE VASC ACCESS RIGHT  01/13/2021   LAPAROSCOPIC UMBILICAL HERNIA REPAIR W/ MESH  06-06-2011   LEFT HEART CATHETERIZATION WITH CORONARY ANGIOGRAM N/A 03/28/2014   Procedure: LEFT HEART CATHETERIZATION WITH CORONARY ANGIOGRAM;  Surgeon: Henry W Smith III, MD;  Location: [facility];  Service: Cardiovascular;  Laterality: N/A;   LIPOMA EXCISION Left 08/25/2017   Procedure: EXCISION LIPOMA LEFT POSTERIOR THIGH;  Surgeon: Connor, Chelsea A, MD;  Location: [facility];  Service: General;  Laterality: Left;   NEPHROLITHOTOMY  1990'S   OPEN APPENDECTOMY W/ PARTIAL CECECTOMY  05-16-2004   PERCUTANEOUS CORONARY STENT INTERVENTION (PCI-S) N/A 06/28/2012   Procedure: PERCUTANEOUS CORONARY STENT INTERVENTION (PCI-S);  Surgeon: Michael Cooper, MD;  Location: [facility];  Service: Cardiovascular;  Laterality: N/A;   POLYPECTOMY  11/20/2020   Procedure: POLYPECTOMY;  Surgeon: Buccini, Robert, MD;  Location: [facility];  Service: Endoscopy;;   RADIOLOGY WITH ANESTHESIA N/A 01/13/2021   Procedure: IR WITH ANESTHESIA - TIPS;  Surgeon: Suttle, Dylan J, MD;  Location: [facility];  Service: Radiology;  Laterality: N/A;       Family History  Problem Relation Age of Onset   Lung cancer Sister     Cancer Sister        lung   Cancer Mother    Cancer Father        died in his 70s.   Cancer Brother        lung   Coronary artery disease Other    Diabetes Other    Colon cancer Other    Cancer Sister        lung    Social History   Tobacco Use   Smoking status: Former    Packs/day: 2.00    Years: 40.00    Pack years: 80.00    Types: Cigarettes    Quit date: 08/29/1996    Years since quitting: 24.5   Smokeless tobacco: Never  Vaping   Use   Vaping Use: Never used  Substance Use Topics   Alcohol use: Not Currently    Alcohol/week: 8.0 standard drinks    Types: 8 Cans of beer per week    Comment: 2 beers every other day   Drug use: No    Home Medications Prior to Admission medications   Medication Sig Start Date End Date Taking? Authorizing Provider  Accu-Chek Softclix Lancets lancets USE AS DIRECTED TO TEST BLOOD SUGAR FOUR TIMES DAILY 03/13/20   Crawford, Elizabeth A, MD  albuterol (VENTOLIN HFA) 108 (90 Base) MCG/ACT inhaler Inhale 2 puffs into the lungs every 6 (six) hours as needed for wheezing or shortness of breath. 03/01/21   Alekh, Kshitiz, MD  blood glucose meter kit and supplies Dispense based on patient and insurance preference. Use up to four times daily as directed. (FOR ICD-10 E10.9, E11.9). 03/12/20   Crawford, Elizabeth A, MD  furosemide (LASIX) 40 MG tablet Take 1 tablet (40 mg total) by mouth daily. 01/31/21   Elgergawy, Dawood S, MD  lactulose (CHRONULAC) 10 GM/15ML solution Take 45 mLs (30 g total) by mouth 3 (three) times daily. 03/06/21   Ghimire, Shanker M, MD  metFORMIN (GLUCOPHAGE) 850 MG tablet TAKE 1 TABLET(850 MG) BY MOUTH TWICE DAILY WITH A MEAL 01/08/21   Crawford, Elizabeth A, MD  nitroGLYCERIN (NITROSTAT) 0.4 MG SL tablet Place 1 tablet (0.4 mg total) under the tongue every 5 (five) minutes as needed for chest pain. 06/08/16   Nahser, Philip J, MD  pantoprazole (PROTONIX) 40 MG tablet Take 1 tablet (40 mg total) by mouth daily. 03/01/21   Alekh,  Kshitiz, MD  rifaximin (XIFAXAN) 550 MG TABS tablet Take 1 tablet (550 mg total) by mouth 2 (two) times daily. 03/06/21   Ghimire, Shanker M, MD  spironolactone (ALDACTONE) 50 MG tablet Take 1 tablet (50 mg total) by mouth daily. 03/07/21   Ghimire, Shanker M, MD  umeclidinium-vilanterol (ANORO ELLIPTA) 62.5-25 MCG/INH AEPB Inhale 1 puff into the lungs daily. 03/12/20   Crawford, Elizabeth A, MD    Allergies    Fluzone quadrivalent [influenza vac split quad]  Review of Systems   Review of Systems  Unable to perform ROS: Mental status change   Physical Exam Updated Vital Signs There were no vitals taken for this visit.  Physical Exam Vitals and nursing note reviewed.  Constitutional:      Appearance: He is well-developed.     Comments: Jaundiced  HENT:     Head: Normocephalic and atraumatic.  Eyes:     Pupils: Pupils are equal, round, and reactive to light.  Neck:     Vascular: No JVD.  Cardiovascular:     Rate and Rhythm: Normal rate and regular rhythm.     Heart sounds: No murmur heard.   No friction rub. No gallop.  Pulmonary:     Effort: No respiratory distress.     Breath sounds: No wheezing.  Abdominal:     General: There is no distension.     Tenderness: There is no abdominal tenderness. There is no guarding or rebound.  Musculoskeletal:        General: Swelling present. Normal range of motion.     Cervical back: Normal range of motion and neck supple.     Comments: 4+ pitting edema to the thighs bilaterally signs of chronic skin changes.  Skin:    Coloration: Skin is not pale.     Findings: No rash.  Neurological:     Mental Status:   He is alert and oriented to person, place, and time.  Psychiatric:        Behavior: Behavior normal.    ED Results / Procedures / Treatments   Labs (all labs ordered are listed, but only abnormal results are displayed) Labs Reviewed  AMMONIA  COMPREHENSIVE METABOLIC PANEL  LACTIC ACID, PLASMA  ETHANOL  CBC WITH  DIFFERENTIAL/PLATELET  CBG MONITORING, ED    EKG None  Radiology No results found.  Procedures Procedures   Medications Ordered in ED Medications - No data to display  ED Course  I have reviewed the triage vital signs and the nursing notes.  Pertinent labs & imaging results that were available during my care of the patient were reviewed by me and considered in my medical decision making (see chart for details).    MDM Rules/Calculators/A&P                           75 yo M with a chief complaints of altered mental status.  Patient has a history of hepatic encephalopathy and has not been taking his medications for that for at least 3 days.  Most likely the cause of his symptoms.  We will obtain a laboratory evaluation reassess.  Patient has hemoglobin less than 7.  Looking his records he is on multiple admissions for GI bleeding.  Patient is confused and does not endorse any GI bleeding.  Rectal exam with soft brown stool no gross blood.  We will transfuse a unit.  Ammonia level of 91.  Will discuss with hospitalist.  CRITICAL CARE Performed by: Daniel Patrick    Total critical care time: 35 minutes  Critical care time was exclusive of separately billable procedures and treating other patients.  Critical care was necessary to treat or prevent imminent or life-threatening deterioration.  Critical care was time spent personally by me on the following activities: development of treatment plan with patient and/or surrogate as well as nursing, discussions with consultants, evaluation of patient's response to treatment, examination of patient, obtaining history from patient or surrogate, ordering and performing treatments and interventions, ordering and review of laboratory studies, ordering and review of radiographic studies, pulse oximetry and re-evaluation of patient's condition.  The patients results and plan were reviewed and discussed.   Any x-rays performed were  independently reviewed by myself.   Differential diagnosis were considered with the presenting HPI.  Medications  lactulose (CHRONULAC) 10 GM/15ML solution 30 g (30 g Oral Given 03/28/21 1727)  rifaximin (XIFAXAN) tablet 550 mg (has no administration in time range)  0.9 %  sodium chloride infusion (Manually program via Guardrails IV Fluids) (has no administration in time range)  cefTRIAXone (ROCEPHIN) 1 g in sodium chloride 0.9 % 100 mL IVPB (has no administration in time range)  pantoprazole (PROTONIX) injection 40 mg (has no administration in time range)    Vitals:   03/28/21 1710 03/28/21 1711 03/28/21 1815  BP:  134/60 (!) 131/52  Pulse: 78  74  Resp: 15  14  Temp: 97.8 F (36.6 C)    TempSrc: Oral    SpO2: 100%  100%  Weight: 108.9 kg    Height: 5' 8" (1.727 m)      Final diagnoses:  Hepatic encephalopathy (HCC)  Acute anemia    Admission/ observation were discussed with the admitting physician, patient and/or family and they are comfortable with the plan.   Final Clinical Impression(s) / ED Diagnoses Final diagnoses:  None      Rx / DC Orders ED Discharge Orders     None        , , DO 03/28/21 1833  

## 2021-03-28 NOTE — ED Notes (Addendum)
Blood transfusion verified by William Hamburger RN - Per charge pt moved to different room and oncoming RN notified of blood transfusion start time

## 2021-03-28 NOTE — H&P (Signed)
History and Physical    ELBER GALYEAN BWI:[id number] DOB: 1946/07/19 DOA: 03/28/2021  PCP: [name], MD  Patient coming from: Home  Chief Complaint: Confusion  HPI: [name] is a 75 y.o. male with medical history significant of cirrhosis 2/2 alcohol use, HCC s/p treatment, chronic hepatic encephalopathy s/p TIPS, CAD, GERD, OA, DM2, COPD, HTN who presents with confusion, swelling.  Patient presented with EMS and had not been taking his medications for about 3 days.  He reports feeling sick and nauseated as the reason he stopped taking them.  He reports no fevers or other changes at home.  He feels like he is more swollen, particularly in his legs and abdomen.  He is not able to report other symptoms or changes given his confusion.    I called his son [name] for further information.  He has not been with his dad the last few days as he was babysitting for his grandchild.  His mother stayed with Mr. Abercrombie and she had noted blood in his stool and a rash on his abdomen a few days ago.  She also noted that he had been throwing up for the last 2-3 days, but not sure if he was vomiting blood.   Confirmed full code status.   ED Course: In the ED, patient was found to have a Cr of 1.02, albumin of 2.8, NH4 of 91, BUN of 1.6, WBC of 2.3 with decreased lymphocytes, INR of 1.4.  Hgb of 6.4 with a HCT of 21 and MCV of 27.  He was initiated on rocephin, lactulose and rifaximin.  IV protonix was started.  1 Units of PRBC ordered. He has a MELD score of 12, child class C.   Review of Systems: As per HPI otherwise all other systems reviewed and are negative.  Past Medical History:  Diagnosis Date   Arthritis    "joints tighten up sometimes" (03/27/2104)   Carcinoma of liver, hepatocellular ([place]) 06/30/2020   Chronic lower back pain    Coronary artery disease involving native coronary artery of native heart without angina pectoris    Severe left main disease at catheterization July 2015  CABG  x3 with a LIMA to the LAD, SVG to the OM, SVG to the PDA on 03/31/14. EF 60% by cath.    Diastolic heart failure (HCC)    GERD without esophagitis 08/04/2010   Hepatic cirrhosis ([place])    a. Dx 01/2014 - CT a/p    History of blood transfusion    "related to bleeding ulcers"   History of concussion    1976--  NO RESIDUAL   History of GI bleed    a. UGIB 07/2012;  b. 01/2014 admission with GIB/FOB stool req 1U prbc's->EGD showed portal gastropathy, barrett's esoph, and chronic active h. pylori gastritis.   History of gout    2007 &  2008  LEFT LEG-- NO ISSUE SINCE   Hyperlipidemia    Iron deficiency anemia    Kidney stones    OA (osteoarthritis of spine)    LOWER BACK--  INTERMITTANT LEFT LEG NUMBNESS   OSA (obstructive sleep apnea)    PULMOLOGIST-  DR CLANCE--  MODERATE OSA  STARTED CPAP 2012--  BUT CURRENTLY HAS NOT USED PAST 6 MONTHS   Phimosis    a. s/p circumcision 2015.   Type 2 diabetes mellitus (HCC)    Unspecified essential hypertension     Past Surgical History:  Procedure Laterality Date   ANKLE FRACTURE SURGERY Right  1989   "plate put in"   APPENDECTOMY  05-16-2004   open   BIOPSY  01/08/2021   Procedure: BIOPSY;  Surgeon: Wilford Corner, MD;  Location: [facility];  Service: Endoscopy;;   CIRCUMCISION N/A 09/09/2013   Procedure: CIRCUMCISION ADULT;  Surgeon: Bernestine Amass, MD;  Location[facility];  Service: Urology;  Laterality: N/A;   COLECTOMY  05-16-2004   COLONOSCOPY WITH PROPOFOL N/A 11/20/2020   Procedure: COLONOSCOPY WITH PROPOFOL;  Surgeon: Ronald Lobo, MD;  Location: [facility];  Service: Endoscopy;  Laterality: N/A;   CORONARY ANGIOPLASTY WITH STENT PLACEMENT  06/28/2012  DR COOPER   PCI W/  X1 DES to [place]. LAD/  LM  40% OSTIAL & 50-60% DISTAL /  50% PROX LCX/  30-40% PROX RCA & 50% MID RCA/   LVEF 65-70%   CORONARY ARTERY BYPASS GRAFT N/A 03/31/2014   Procedure: CORONARY ARTERY BYPASS GRAFTING (CABG) times 3 using left internal  mammary artery and right saphenous vein.;  Surgeon: Melrose Nakayama, MD;  Location: [place];  Service: Open Heart Surgery;  Laterality: N/A;   DOBUTAMINE STRESS ECHO  06-08-2012   MODERATE HYPOKINESIS/ ISCHEMIA MID INFERIOR WALL   ESOPHAGOGASTRODUODENOSCOPY  08/15/2012   Procedure: ESOPHAGOGASTRODUODENOSCOPY (EGD);  Surgeon: Wonda Horner, MD;  Location: [facility] ENDOSCOPY;  Service: Endoscopy;  Laterality: N/A;   ESOPHAGOGASTRODUODENOSCOPY N/A 02/17/2014   Procedure: ESOPHAGOGASTRODUODENOSCOPY (EGD);  Surgeon: Jeryl Columbia, MD;  Location: [facility] ENDOSCOPY;  Service: Endoscopy;  Laterality: N/A;   ESOPHAGOGASTRODUODENOSCOPY N/A 04/17/2020   Procedure: ESOPHAGOGASTRODUODENOSCOPY (EGD);  Surgeon: Ronnette Juniper, MD;  Location: [place];  Service: Gastroenterology;  Laterality: N/A;   ESOPHAGOGASTRODUODENOSCOPY N/A 09/11/2020   Procedure: ESOPHAGOGASTRODUODENOSCOPY (EGD);  Surgeon: Clarene Essex, MD;  Location: [facility];  Service: Endoscopy;  Laterality: N/A;   ESOPHAGOGASTRODUODENOSCOPY  10/09/2020   ESOPHAGOGASTRODUODENOSCOPY N/A 10/09/2020   Procedure: ESOPHAGOGASTRODUODENOSCOPY (EGD);  Surgeon: Otis Brace, MD;  Location[facility] ENDOSCOPY;  Service: Gastroenterology;  Laterality: N/A;   ESOPHAGOGASTRODUODENOSCOPY N/A 01/08/2021   Procedure: ESOPHAGOGASTRODUODENOSCOPY (EGD);  Surgeon: Wilford Corner, MD;  Location: [facility];  Service: Endoscopy;  Laterality: N/A;   ESOPHAGOGASTRODUODENOSCOPY (EGD) WITH PROPOFOL N/A 09/30/2017   Procedure: ESOPHAGOGASTRODUODENOSCOPY (EGD) WITH PROPOFOL;  Surgeon: Wonda Horner, MD;  Location[facility]COPY;  Service: Endoscopy;  Laterality: N/A;   ESOPHAGOGASTRODUODENOSCOPY (EGD) WITH PROPOFOL N/A 12/20/2017   Procedure: ESOPHAGOGASTRODUODENOSCOPY (EGD) WITH PROPOFOL;  Surgeon: Clarene Essex, MD;  Location: [place];  Service: Endoscopy;  Laterality: N/A;   ESOPHAGOGASTRODUODENOSCOPY (EGD) WITH PROPOFOL N/A 08/10/2020   Procedure: ESOPHAGOGASTRODUODENOSCOPY (EGD)  WITH PROPOFOL;  Surgeon: Wilford Corner, MD;  Location: [place];  Service: Endoscopy;  Laterality: N/A;   ESOPHAGOGASTRODUODENOSCOPY (EGD) WITH PROPOFOL N/A 11/20/2020   Procedure: ESOPHAGOGASTRODUODENOSCOPY (EGD) WITH PROPOFOL;  Surgeon: Ronald Lobo, MD;  [facility];  Service: Endoscopy;  Laterality: N/A;   ESOPHAGOGASTRODUODENOSCOPY (EGD) WITH PROPOFOL N/A 02/28/2021   Procedure: ESOPHAGOGASTRODUODENOSCOPY (EGD) WITH PROPOFOL;  Surgeon: Clarene Essex, MD;  [facility];  Service: Endoscopy;  Laterality: N/A;   GIVENS CAPSULE STUDY N/A 11/20/2020   Procedure: GIVENS CAPSULE STUDY;  Surgeon: Ronald Lobo, MD;  [facility];  Service: Endoscopy;  Laterality: N/A;   HERNIA REPAIR     INTRAOPERATIVE TRANSESOPHAGEAL ECHOCARDIOGRAM N/A 03/31/2014   Procedure: INTRAOPERATIVE TRANSESOPHAGEAL ECHOCARDIOGRAM;  Surgeon: Melrose Nakayama, MD;  Location: [place];  Service: Open Heart Surgery;  Laterality: N/A;   IR ANGIOGRAM SELECTIVE EACH ADDITIONAL VESSEL  06/16/2020   IR ANGIOGRAM SELECTIVE EACH ADDITIONAL VESSEL  06/16/2020  IR ANGIOGRAM SELECTIVE EACH ADDITIONAL VESSEL  06/16/2020   IR ANGIOGRAM SELECTIVE EACH ADDITIONAL VESSEL  06/16/2020   IR ANGIOGRAM SELECTIVE EACH ADDITIONAL VESSEL  06/16/2020   IR ANGIOGRAM SELECTIVE EACH ADDITIONAL VESSEL  06/16/2020   IR ANGIOGRAM SELECTIVE EACH ADDITIONAL VESSEL  06/16/2020   IR ANGIOGRAM SELECTIVE EACH ADDITIONAL VESSEL  06/16/2020   IR ANGIOGRAM SELECTIVE EACH ADDITIONAL VESSEL  06/16/2020   IR ANGIOGRAM SELECTIVE EACH ADDITIONAL VESSEL  06/30/2020   IR ANGIOGRAM SELECTIVE EACH ADDITIONAL VESSEL  06/30/2020   IR ANGIOGRAM VISCERAL SELECTIVE  06/16/2020   IR ANGIOGRAM VISCERAL SELECTIVE  06/30/2020   IR EMBO ARTERIAL NOT HEMORR HEMANG INC GUIDE ROADMAPPING  06/16/2020   IR EMBO TUMOR ORGAN ISCHEMIA INFARCT INC GUIDE ROADMAPPING  06/30/2020   IR EMBO VENOUS NOT HEMORR HEMANG  INC GUIDE ROADMAPPING  01/13/2021   IR IVUS  EACH ADDITIONAL NON CORONARY VESSEL  01/13/2021   IR RADIOLOGIST EVAL & MGMT  05/06/2020   IR RADIOLOGIST EVAL & MGMT  05/20/2020   IR TIPS  01/13/2021   IR US GUIDE VASC ACCESS RIGHT  06/16/2020   IR US GUIDE VASC ACCESS RIGHT  06/30/2020   IR US GUIDE VASC ACCESS RIGHT  01/13/2021   LAPAROSCOPIC UMBILICAL HERNIA REPAIR W/ MESH  06-06-2011   LEFT HEART CATHETERIZATION WITH CORONARY ANGIOGRAM N/A 03/28/2014   Procedure: LEFT HEART CATHETERIZATION WITH CORONARY ANGIOGRAM;  Surgeon: Sinclair Grooms, MD;  Location: [facility] CATH LAB;  Service: Cardiovascular;  Laterality: N/A;   LIPOMA EXCISION Left 08/25/2017   Procedure: EXCISION LIPOMA LEFT POSTERIOR THIGH;  Surgeon: Clovis Riley, MD;  Location: [place];  Service: General;  Laterality: Left;   NEPHROLITHOTOMY  1990'S   OPEN APPENDECTOMY W/ PARTIAL CECECTOMY  05-16-2004   PERCUTANEOUS CORONARY STENT INTERVENTION (PCI-S) N/A 06/28/2012   Procedure: PERCUTANEOUS CORONARY STENT INTERVENTION (PCI-S);  Surgeon: Sherren Mocha, MD;  Location: [facility] CATH LAB;  Service: Cardiovascular;  Laterality: N/A;   POLYPECTOMY  11/20/2020   Procedure: POLYPECTOMY;  Surgeon: Ronald Lobo, MD;  [facility];  Service: Endoscopy;;   RADIOLOGY WITH ANESTHESIA N/A 01/13/2021   Procedure: IR WITH ANESTHESIA - TIPS;  Surgeon: Suzette Battiest, MD;  Location: [place];  Service: Radiology;  Laterality: N/A;    Social History  reports that he quit smoking about 24 years ago. His smoking use included cigarettes. He has a 80.00 pack-year smoking history. He has never used smokeless tobacco. He reports previous alcohol use of about 8.0 standard drinks of alcohol per week. He reports that he does not use drugs.  Allergies  Allergen Reactions   Fluzone Quadrivalent [Influenza Vac Split Quad] Other (See Comments)    The patient stated, in 10/2020: "I am not taking any more flu shots. It liked to have killed me."    Family History  Problem Relation Age of Onset   Lung  cancer Sister    Cancer Sister        lung   Cancer Mother    Cancer Father        died in his 32s.   Cancer Brother        lung   Coronary artery disease Other    Diabetes Other    Colon cancer Other    Cancer Sister        lung    Prior to Admission medications   Medication Sig Start Date End Date Taking? Authorizing Provider  Accu-Chek Softclix Lancets lancets USE AS DIRECTED TO TEST BLOOD  SUGAR FOUR TIMES DAILY 03/13/20   [name], MD  albuterol (VENTOLIN HFA) 108 (90 Base) MCG/ACT inhaler Inhale 2 puffs into the lungs every 6 (six) hours as needed for wheezing or shortness of breath. 03/01/21   Aline August, MD  blood glucose meter kit and supplies Dispense based on patient and insurance preference. Use up to four times daily as directed. (FOR ICD-10 E10.9, E11.9). 03/12/20   [name], MD  furosemide (LASIX) 40 MG tablet Take 1 tablet (40 mg total) by mouth daily. 01/31/21   Elgergawy, Silver Huguenin, MD  lactulose (CHRONULAC) 10 GM/15ML solution Take 45 mLs (30 g total) by mouth 3 (three) times daily. 03/06/21   Ghimire, Henreitta Leber, MD  metFORMIN (GLUCOPHAGE) 850 MG tablet TAKE 1 TABLET(850 MG) BY MOUTH TWICE DAILY WITH A MEAL 01/08/21   [name], MD  nitroGLYCERIN (NITROSTAT) 0.4 MG SL tablet Place 1 tablet (0.4 mg total) under the tongue every 5 (five) minutes as needed for chest pain. 06/08/16   Nahser, Wonda Cheng, MD  pantoprazole (PROTONIX) 40 MG tablet Take 1 tablet (40 mg total) by mouth daily. 03/01/21   Aline August, MD  rifaximin (XIFAXAN) 550 MG TABS tablet Take 1 tablet (550 mg total) by mouth 2 (two) times daily. 03/06/21   Ghimire, Henreitta Leber, MD  spironolactone (ALDACTONE) 50 MG tablet Take 1 tablet (50 mg total) by mouth daily. 03/07/21   Ghimire, Henreitta Leber, MD  umeclidinium-vilanterol (ANORO ELLIPTA) 62.5-25 MCG/INH AEPB Inhale 1 puff into the lungs daily. 03/12/20   [name], MD    Physical Exam: Vitals:   03/28/21 1830 03/28/21  1845 03/28/21 1900 03/28/21 1915  BP: (!) 122/52 (!) 125/59 (!) 155/65 (!) 127/57  Pulse: 73 71 74 72  Resp: _0 Temp:      TempSrc:      SpO2: 100% 100% 100% 100%  Weight:      Height:        Constitutional: NAD, calm, comfortable Eyes: He has some reddish marks on the eyelids, + icterus and injection ENMT: Mucous membranes are dry.  Neck: normal, supple Respiratory: Clear anteriorly, no wheezing or rales noted.  Cardiovascular: RR, NR, no murmur.  + edema up to the abdomen; 2-3+, pitting to the thigh Abdomen: NT, +BS, ascites with fluid wave, abdominal wall edema to umbilicus and chronic skin changes. Soft Musculoskeletal: Arthritis changes to the hands, normal tone Skin: He has reddish marks on face (telangectasias?), chronic darkening of skin, he has venous insufficiency changes to mid calves bilaterally Neurologic: Confused, easily distracted and falling asleep.  Moving all extremities and following commands, no asterixis.  Psychiatric: Alert, oriented to person and place, decreased insight.   Labs on Admission: I have personally reviewed following labs and imaging studies  CBC: Recent Labs  Lab 03/28/21 1714 03/28/21 1730  WBC 2.3*  --   NEUTROABS 1.7  --   HGB 6.4* 6.8*  HCT 21.5* 20.0*  MCV 77.3*  --   PLT 106*  --     Basic Metabolic Panel: Recent Labs  Lab 03/28/21 1714 03/28/21 1730  NA 137 141  K 3.7 3.7  CL 109  --   CO2 21*  --   GLUCOSE 106*  --   BUN 16  --   CREATININE 1.02  --   CALCIUM 8.7*  --     GFR: Estimated Creatinine Clearance: 74.9 mL/min (by C-G formula based on SCr of 1.02 mg/dL).  Liver  Function Tests: Recent Labs  Lab 03/28/21 1714  AST 30  ALT 22  ALKPHOS 119  BILITOT 1.6*  PROT 6.4*  ALBUMIN 2.8*    Urine analysis:    Component Value Date/Time   COLORURINE YELLOW 01/22/2021 1856   APPEARANCEUR CLEAR 01/22/2021 1856   LABSPEC 1.014 01/22/2021 1856   PHURINE 6.0 01/22/2021 1856   GLUCOSEU NEGATIVE  01/22/2021 1856   HGBUR NEGATIVE 01/22/2021 1856   BILIRUBINUR NEGATIVE 01/22/2021 1856   KETONESUR NEGATIVE 01/22/2021 1856   PROTEINUR 30 (A) 01/22/2021 1856   UROBILINOGEN 0.2 03/29/2014 1546   NITRITE NEGATIVE 01/22/2021 1856   LEUKOCYTESUR NEGATIVE 01/22/2021 1856    Radiological Exams on Admission: DG Chest Port 1 View  Result Date: 03/28/2021 CLINICAL DATA:  Altered mental status. EXAM: PORTABLE CHEST 1 VIEW COMPARISON:  Chest radiograph 01/27/2021. FINDINGS: Monitoring leads overlie the patient. Stable cardiomegaly. Interval increase in diffuse bilateral interstitial opacities. No large pleural effusion or pneumothorax. IMPRESSION: Cardiomegaly and interstitial edema. Electronically Signed   By: Lovey Newcomer M.D.   On: 03/28/2021 17:31    EKG: Independently reviewed. NSR with PAC  Assessment/Plan  Hepatic encephalopathy  Alcoholic cirrhosis of liver with ascites History of treated Hepatocellular carcinoma - MELD and Child Pugh as above - Restart lactulose and rifaximin - Monitor for improvement - Restart diuretics for ascites/lower extremity edema - Zofran for nausea  Acute GI bleeding - Hgb down to 6.8, 1 unit of PRBC ordered - Per family, there was some blood in the stool earlier this week, however in the ED stool was brown without blood - Patient with EGD earlier this month, unclear utility of repeating at this point - s/p TIPS procedure ~ 2 months ago - Consult GI in the AM, acutely if rebleeding occurs - Rocephin started empirically to avoid SBP in the setting of possible bleeding - IV PPI started - Repeat H/H post transfusion.  - Telemetry in the acute setting  Type 2 diabetes with complication - SSI started    OSA (obstructive sleep apnea) - Not currently on CPAP, oxygen as needed    Coronary artery disease involving native coronary artery of native heart without angina pectoris Hyperlipidemia with target LDL less than 70 S/P CABG x 3 - No current chest  pain - Continue lasix, spironolactone - Trend potassium    (HFpEF) heart failure with preserved ejection fraction - volume is up due to cirrhosis - Restart lasix and spironolactone     DVT prophylaxis: SCDs  Code Status:   Full  Family Communication:  Son [name], confirmed code status and recent history  Disposition Plan:   Patient is from:  Home  Anticipated DC to:  Home  Anticipated DC date:  04/01/21  Anticipated DC barriers: Bleeding, return of normal mentation  Consults called:  None, needs GI int he AM (with names) Admission status:  IP, medical telemetry (inpatient / obs / tele / medical floor / SDU)  Severity of Illness: The appropriate patient status for this patient is INPATIENT. Inpatient status is judged to be reasonable and necessary in order to provide the required intensity of service to ensure the patient's safety. The patient's presenting symptoms, physical exam findings, and initial radiographic and laboratory data in the context of their chronic comorbidities is felt to place them at high risk for further clinical deterioration. Furthermore, it is not anticipated that the patient will be medically stable for discharge from the hospital within 2 midnights of admission. The following factors support the patient status  of inpatient.   " The patient's presenting symptoms include confusion, swelling. " The worrisome physical exam findings include swelling. " The initial radiographic and laboratory data are worrisome because of low H/H, high NH4. " The chronic co-morbidities include hepatic cirrhosis.   * I certify that at the point of admission it is my clinical judgment that the patient will require inpatient hospital care spanning beyond 2 midnights from the point of admission due to high intensity of service, high risk for further deterioration and high frequency of surveillance required.Gilles Chiquito MD [facility]  How to contact the [facility] Attending or  Consulting provider [place] or covering provider during after hours [place], for this patient?   Check the care team in [facility] and look for a) attending/consulting [facility] provider listed and b) the [facility] team listed Log into [email or web address] and use [place]'s universal password to access. If you do not have the password, please contact the hospital operator. Locate the [facility] provider you are looking for under [facility] and page to a number that you can be directly reached. If you still have difficulty reaching the provider, please page the [facility] (Director on Call) for the Hospitalists listed on amion for assistance.  03/28/2021, 7:59 PM

## 2021-03-28 NOTE — ED Triage Notes (Signed)
Pt arrives from home via EMS. Pt's son came over today and found pt to be altered, Pt has no hx of dementia. Pt's son looked at pt med box and notice pt has 3 doses of all meds had not been taken, some of which are lactulose, furosemide, metformin. Pt has pitting edema in lower extremities and ascites. Pt is ina-fib, rate 80's  with EMS. Pt is alert to person and place but not situation or time. Pt's only complaint is mild weakness, denies any other complaints. Pt's son reports hx of increased ammonia levels. BP 160/76 HR 80 RR 18 O2 99% RA CBG 124 18g IV established in LAC

## 2021-03-29 DIAGNOSIS — D649 Anemia, unspecified: Secondary | ICD-10-CM

## 2021-03-29 DIAGNOSIS — C220 Liver cell carcinoma: Secondary | ICD-10-CM

## 2021-03-29 DIAGNOSIS — K7030 Alcoholic cirrhosis of liver without ascites: Secondary | ICD-10-CM

## 2021-03-29 LAB — CBG MONITORING, ED
Glucose-Capillary: 86 mg/dL (ref 70–99)
Glucose-Capillary: 84 mg/dL (ref 70–99)
Glucose-Capillary: 69 mg/dL — ABNORMAL LOW (ref 70–99)
Glucose-Capillary: 78 mg/dL (ref 70–99)

## 2021-03-29 LAB — HEMOGLOBIN AND HEMATOCRIT, BLOOD
HCT: 23.5 % — ABNORMAL LOW (ref 39.0–52.0)
HCT: 28 % — ABNORMAL LOW (ref 39.0–52.0)
Hemoglobin: 7 g/dL — ABNORMAL LOW (ref 13.0–17.0)
Hemoglobin: 8.5 g/dL — ABNORMAL LOW (ref 13.0–17.0)

## 2021-03-29 LAB — GLUCOSE, CAPILLARY
Glucose-Capillary: 86 mg/dL (ref 70–99)
Glucose-Capillary: 83 mg/dL (ref 70–99)
Glucose-Capillary: 127 mg/dL — ABNORMAL HIGH (ref 70–99)

## 2021-03-29 LAB — URINALYSIS, ROUTINE W REFLEX MICROSCOPIC
Bilirubin Urine: NEGATIVE
Glucose, UA: NEGATIVE mg/dL
Hgb urine dipstick: NEGATIVE
Ketones, ur: NEGATIVE mg/dL
Leukocytes,Ua: NEGATIVE
Nitrite: NEGATIVE
Protein, ur: NEGATIVE mg/dL
Specific Gravity, Urine: 1.017 (ref 1.005–1.030)
pH: 6 (ref 5.0–8.0)

## 2021-03-29 LAB — COMPREHENSIVE METABOLIC PANEL
ALT: 21 U/L (ref 0–44)
AST: 39 U/L (ref 15–41)
Albumin: 2.6 g/dL — ABNORMAL LOW (ref 3.5–5.0)
Alkaline Phosphatase: 104 U/L (ref 38–126)
Anion gap: 8 (ref 5–15)
BUN: 14 mg/dL (ref 8–23)
CO2: 19 mmol/L — ABNORMAL LOW (ref 22–32)
Calcium: 8.4 mg/dL — ABNORMAL LOW (ref 8.9–10.3)
Chloride: 110 mmol/L (ref 98–111)
Creatinine, Ser: 0.95 mg/dL (ref 0.61–1.24)
GFR, Estimated: >60 mL/min (ref >60)
Glucose, Bld: 86 mg/dL (ref 70–99)
Potassium: 3.8 mmol/L (ref 3.5–5.1)
Sodium: 137 mmol/L (ref 135–145)
Total Bilirubin: 1.3 mg/dL — ABNORMAL HIGH (ref 0.3–1.2)
Total Protein: 6.2 g/dL — ABNORMAL LOW (ref 6.5–8.1)

## 2021-03-29 LAB — PROTIME-INR
INR: 1.3 — ABNORMAL HIGH (ref 0.8–1.2)
Prothrombin Time: 16.4 seconds — ABNORMAL HIGH (ref 11.4–15.2)

## 2021-03-29 LAB — PREPARE RBC (CROSSMATCH)

## 2021-03-29 LAB — OCCULT BLOOD X 1 CARD TO LAB, STOOL: Fecal Occult Bld: POSITIVE — AB

## 2021-03-29 MED ORDER — CHLORHEXIDINE GLUCONATE CLOTH 2 % EX PADS
6.0000 | MEDICATED_PAD | Freq: Every day | CUTANEOUS | Status: DC
  Administered 2021-03-30 – 2021-03-31 (×2): 6 via TOPICAL

## 2021-03-29 MED ORDER — SODIUM CHLORIDE 0.9% IV SOLUTION: Freq: Once | INTRAVENOUS | Status: AC

## 2021-03-29 MED ORDER — SODIUM CHLORIDE 0.9 % IV SOLN
2.0000 g | INTRAVENOUS | Status: DC
  Administered 2021-03-29 – 2021-03-30 (×2): 2 g via INTRAVENOUS
  Filled 2021-03-29 (×2): qty 20

## 2021-03-29 NOTE — ED Notes (Addendum)
Assisted with urinal. Unable to void. Repositioned in bed. Denies pain, sob, nausea. Diaper and bed dry.

## 2021-03-29 NOTE — Progress Notes (Signed)
[facility] PROGRESS NOTE    Progress Note  [name]  TZG:[id number] DOB: September 18, 1945 DOA: 03/28/2021 PCP: [name], MD     Brief Narrative:   [name] is an 75 y.o. male past medical history of cirrhosis secondary to alcohol, hepatocellular carcinoma status posttreatment, chronic hepatic encephalopathy with a history of TIPS diabetes mellitus type 2 comes into the ED for confusion, he relates he stopped taking his medications about 3 days ago denies any fever creatinine of 1.0, albumin of 2.8 ammonia level 91 BUN 14, creatinine 0.9, INR 1.4 hemoglobin of 6.4, MCV of 77 and elevated RDW with thrombocytopenia started on IV Protonix, Rocephin lactulose and rifaximin, he was also given 1 unit of packed red blood cells his meld score was 12 with a child class C.  Of note as per family he had some bloody stools earlier this week however the stools appeared brown on admission FOBT is pending.  Assessment/Plan:   Hepatic encephalopathy ([place]): Likely due to noncompliance with her lactulose and rifaximin. Meld score of 12. Restarted diuretics lactulose and rifaximin. Patient still encephalopathic with asterixis cannot provide history. I was unsuccessful trying to reach to family.  Acute blood loss anemia: With a hemoglobin of 6.8 status post 1 unit of packed red blood cells, hematocrit was repeated and is hemoglobin does not improve appropriately we will go ahead and give him another unit of packed red blood cells. Family members head no bloody stool earlier this week but her stools are brown was sent for FOBT.  I was unsuccessful in trying to reach the family. TIPS 2 months ago. No further bleeding at this time, I agree with IV Rocephin for empiric treatment of SBP if possible bleeding.  Diabetes mellitus type 2: A1c of 5.0 blood glucose fairly controlled.  Obstructive sleep apnea: Currently not on BiPAP.  Coronary artery disease/hyperlipidemia with a history  of CABG x3: Continue diuretics monitor potassium denies any chest pain or shortness of breath.  Chronic diastolic heart failure: Appears to be volume overloaded restart diuretics   DVT prophylaxis: scd Family Communication:none Status is: Inpatient  Remains inpatient appropriate because:Hemodynamically unstable  Dispo: The patient is from: Home              Anticipated d/c is to: Home              Patient currently is not medically stable to d/c.   Difficult to place patient No  Code Status:     Code Status Orders  (From admission, onward)           Start     Ordered   03/28/21 2122  Full code  Continuous        03/28/21 2121           Code Status History     Date Active Date Inactive Code Status Order ID Comments User Context   03/04/2021 1646 03/06/2021 2010 Full Code [id number]  Karmen Bongo, MD ED   02/27/2021 2018 03/01/2021 1650 Full Code [id number]  Orene Desanctis, DO ED   01/23/2021 0425 01/30/2021 2119 Full Code [id number]  Reubin Milan, MD ED   01/06/2021 1956 01/17/2021 2040 Full Code [id number]  Lenore Cordia, MD ED   11/18/2020 1623 11/23/2020 1939 Full Code [id number]  Harold Hedge, MD ED   10/08/2020 2239 10/10/2020 2352 Full Code [id number]  Elwyn Reach, MD ED   09/10/2020 2305 09/12/2020 1939 Full Code [id number]  Opyd, Ilene Qua,  MD ED   08/11/2020 0351 08/18/2020 1843 Full Code [id number]  Shela Leff, MD Inpatient   08/10/2020 1957 08/11/2020 0351 DNR [id number]  Lavena Bullion, NP Inpatient   08/09/2020 2216 08/10/2020 1957 Full Code [id number]  Neena Rhymes, MD ED   04/15/2020 2216 04/23/2020 1707 Full Code [id number]  Lenore Cordia, MD ED   12/27/2019 1016 01/04/2020 1733 Full Code [id number]  Vernelle Emerald, MD ED   11/01/2019 0134 11/04/2019 1939 Full Code [id number]  Howerter, Ethelda Chick, DO ED   07/29/2019 2052 08/07/2019 1608 Full Code [id number]  Lenore Cordia, MD ED   12/24/2018 1128 12/25/2018 1915 Full Code [id number]  Norval Morton,  MD ED   12/18/2017 2224 12/22/2017 1431 Full Code [id number]  Vianne Bulls, MD ED   11/20/2017 1951 11/23/2017 1716 Full Code [id number]  Norval Morton, MD ED   09/29/2017 2212 10/05/2017 2026 Full Code [id number]  Norval Morton, MD ED   03/31/2014 1245 04/03/2014 1638 Full Code [id number]  John Giovanni, PA-C Inpatient   03/28/2014 0903 03/31/2014 1245 Full Code [id number]  Belva Crome, MD Inpatient   03/27/2014 1742 03/28/2014 0903 Full Code [id number]  Rogelia Mire, NP Inpatient   02/17/2014 0313 02/18/2014 1339 Full Code [id number]  Elmarie Shiley, MD Inpatient   08/13/2012 2145 08/17/2012 1438 Full Code [id number]  Almyra Free, RN Inpatient         IV Access:   Peripheral IV   Procedures and diagnostic studies:   DG Chest Port 1 View  Result Date: 03/28/2021 CLINICAL DATA:  Altered mental status. EXAM: PORTABLE CHEST 1 VIEW COMPARISON:  Chest radiograph 01/27/2021. FINDINGS: Monitoring leads overlie the patient. Stable cardiomegaly. Interval increase in diffuse bilateral interstitial opacities. No large pleural effusion or pneumothorax. IMPRESSION: Cardiomegaly and interstitial edema. Electronically Signed   By: Lovey Newcomer M.D.   On: 03/28/2021 17:31     Medical Consultants:   None.   Subjective:    [name] patient is confabulating, and with a tangential speech  Objective:    Vitals:   03/29/21 0400 03/29/21 0430 03/29/21 0500 03/29/21 0630  BP: (!) 131/56 (!) 152/54 (!) 144/66 136/76  Pulse: 73 78 74 71  Resp: 18 17 20 18   Temp:      TempSrc:      SpO2: 95% 99% 100% 98%  Weight:      Height:       SpO2: 98 %  No intake or output data in the 24 hours ending 03/29/21 0750 Filed Weights   03/28/21 1710  Weight: 108.9 kg    Exam: General exam: In no acute distress, he is anasarca Respiratory system: Lungs are clean Cardiovascular system: S1 & S2 heard, RRR. No JVD,. Gastrointestinal system: Abdomen is nondistended, soft and nontender.  Central  nervous system: Alert and oriented. No focal neurological deficits. Extremities: 3+ edema Skin: No rashes, lesions or ulcers Psychiatry: No insight or judgment medical condition.   Data Reviewed:    Labs: Basic Metabolic Panel: Recent Labs  Lab 03/28/21 1714 03/28/21 1730 03/29/21 0312  NA 137 141 137  K 3.7 3.7 3.8  CL 109  --  110  CO2 21*  --  19*  GLUCOSE 106*  --  86  BUN 16  --  14  CREATININE 1.02  --  0.95  CALCIUM 8.7*  --  8.4*   GFR Estimated Creatinine Clearance: 80.4 mL/min (by C-G formula based on SCr of  0.95 mg/dL). Liver Function Tests: Recent Labs  Lab 03/28/21 1714 03/29/21 0312  AST 30 39  ALT 22 21  ALKPHOS 119 104  BILITOT 1.6* 1.3*  PROT 6.4* 6.2*  ALBUMIN 2.8* 2.6*   No results for input(s): LIPASE, AMYLASE in the last 168 hours. Recent Labs  Lab 03/28/21 1714  AMMONIA 91*   Coagulation profile Recent Labs  Lab 03/28/21 1714 03/29/21 0312  INR 1.4* 1.3*   COVID-19 Labs  No results for input(s): DDIMER, FERRITIN, LDH, CRP in the last 72 hours.  Lab Results  Component Value Date   SARSCOV2NAA NEGATIVE 03/28/2021   SARSCOV2NAA NEGATIVE 03/04/2021   [place] NEGATIVE 02/27/2021   [place] NEGATIVE 01/22/2021    CBC: Recent Labs  Lab 03/28/21 1714 03/28/21 1730 03/29/21 0312  WBC 2.3*  --   --   NEUTROABS 1.7  --   --   HGB 6.4* 6.8* 7.0*  HCT 21.5* 20.0* 23.5*  MCV 77.3*  --   --   PLT 106*  --   --    Cardiac Enzymes: No results for input(s): CKTOTAL, CKMB, CKMBINDEX, TROPONINI in the last 168 hours. BNP (last 3 results) No results for input(s): PROBNP in the last 8760 hours. CBG: Recent Labs  Lab 03/28/21 1735 03/28/21 2136 03/29/21 0100 03/29/21 0418  GLUCAP 97 92 86 84   D-Dimer: No results for input(s): DDIMER in the last 72 hours. Hgb A1c: Recent Labs    03/28/21 2122  HGBA1C 5.0   Lipid Profile: No results for input(s): CHOL, HDL, LDLCALC, TRIG, CHOLHDL, LDLDIRECT in the last 72  hours. Thyroid function studies: No results for input(s): TSH, T4TOTAL, T3FREE, THYROIDAB in the last 72 hours.  Invalid input(s): FREET3 Anemia work up: No results for input(s): VITAMINB12, FOLATE, FERRITIN, TIBC, IRON, RETICCTPCT in the last 72 hours. Sepsis Labs: Recent Labs  Lab 03/28/21 1714  WBC 2.3*  LATICACIDVEN 1.5   Microbiology Recent Results (from the past 240 hour(s))  Resp Panel by RT-PCR (Flu A&B, Covid) Nasopharyngeal Swab     Status: None   Collection Time: 03/28/21  6:48 PM   Specimen: Nasopharyngeal Swab; Nasopharyngeal(NP) swabs in vial transport medium  Result Value Ref Range Status   SARS Coronavirus 2 by RT PCR NEGATIVE NEGATIVE Final    Comment: (NOTE) SARS-CoV-2 target nucleic acids are NOT DETECTED.  The SARS-CoV-2 RNA is generally detectable in upper respiratory specimens during the acute phase of infection. The lowest concentration of SARS-CoV-2 viral copies this assay can detect is 138 copies/mL. A negative result does not preclude SARS-Cov-2 infection and should not be used as the sole basis for treatment or other patient management decisions. A negative result may occur with  improper specimen collection/handling, submission of specimen other than nasopharyngeal swab, presence of viral mutation(s) within the areas targeted by this assay, and inadequate number of viral copies(<138 copies/mL). A negative result must be combined with clinical observations, patient history, and epidemiological information. The expected result is Negative.  Fact Sheet for Patients:  EntrepreneurPulse.com.au  Fact Sheet for Healthcare Providers:  IncredibleEmployment.be  This test is no t yet approved or cleared by the [place] FDA and  has been authorized for detection and/or diagnosis of SARS-CoV-2 by FDA under an Emergency Use Authorization (EUA). This EUA will remain  in effect (meaning this test can be used) for the  duration of the COVID-19 declaration under Section 564(b)(1) of the Act, 21 U.S.C.section 360bbb-3(b)(1), unless the authorization is terminated  or revoked sooner.  Influenza A by PCR NEGATIVE NEGATIVE Final   Influenza B by PCR NEGATIVE NEGATIVE Final    Comment: (NOTE) The Xpert Xpress SARS-CoV-2/FLU/RSV plus assay is intended as an aid in the diagnosis of influenza from Nasopharyngeal swab specimens and should not be used as a sole basis for treatment. Nasal washings and aspirates are unacceptable for Xpert Xpress SARS-CoV-2/FLU/RSV testing.  Fact Sheet for Patients: EntrepreneurPulse.com.au  Fact Sheet for Healthcare Providers: IncredibleEmployment.be  This test is not yet approved or cleared by the [place] FDA and has been authorized for detection and/or diagnosis of SARS-CoV-2 by FDA under an Emergency Use Authorization (EUA). This EUA will remain in effect (meaning this test can be used) for the duration of the COVID-19 declaration under Section 564(b)(1) of the Act, 21 U.S.C. section 360bbb-3(b)(1), unless the authorization is terminated or revoked.  Performed at [facility], [place] [address], [place], [place] [address]      Medications:    furosemide  40 mg Oral Daily   insulin aspart  0-15 Units Subcutaneous Q4H   lactulose  30 g Oral TID   rifaximin  550 mg Oral BID   sodium chloride flush  3 mL Intravenous Q12H   spironolactone  50 mg Oral Daily   umeclidinium-vilanterol  1 puff Inhalation Daily   Continuous Infusions:  cefTRIAXone (ROCEPHIN)  IV Stopped (03/28/21 2121)      LOS: 1 day   Charlynne Cousins  [facility]  03/29/2021, 7:50 AM

## 2021-03-29 NOTE — ED Notes (Signed)
Blood infusion continues, VSS, foley draining, remains alert, NAD, calm, interactive, belongings including clothes and phone with pt. Pt transported by RN to 5M17.

## 2021-03-30 LAB — GLUCOSE, CAPILLARY
Glucose-Capillary: 128 mg/dL — ABNORMAL HIGH (ref 70–99)
Glucose-Capillary: 160 mg/dL — ABNORMAL HIGH (ref 70–99)
Glucose-Capillary: 169 mg/dL — ABNORMAL HIGH (ref 70–99)
Glucose-Capillary: 185 mg/dL — ABNORMAL HIGH (ref 70–99)
Glucose-Capillary: 91 mg/dL (ref 70–99)
Glucose-Capillary: 98 mg/dL (ref 70–99)

## 2021-03-30 LAB — BPAM RBC
Blood Product Expiration Date: 202208312359
Blood Product Expiration Date: 202208312359
ISSUE DATE / TIME: 202207312144
ISSUE DATE / TIME: 202208010912
Unit Type and Rh: 5100
Unit Type and Rh: 5100

## 2021-03-30 LAB — BASIC METABOLIC PANEL
Anion gap: 3 — ABNORMAL LOW (ref 5–15)
BUN: 11 mg/dL (ref 8–23)
CO2: 22 mmol/L (ref 22–32)
Calcium: 8.7 mg/dL — ABNORMAL LOW (ref 8.9–10.3)
Chloride: 112 mmol/L — ABNORMAL HIGH (ref 98–111)
Creatinine, Ser: 1.03 mg/dL (ref 0.61–1.24)
GFR, Estimated: >60 mL/min (ref >60)
Glucose, Bld: 170 mg/dL — ABNORMAL HIGH (ref 70–99)
Potassium: 3.6 mmol/L (ref 3.5–5.1)
Sodium: 137 mmol/L (ref 135–145)

## 2021-03-30 LAB — TYPE AND SCREEN
ABO/RH(D): O POS
Antibody Screen: NEGATIVE
Unit division: 0
Unit division: 0

## 2021-03-30 MED ORDER — PANTOPRAZOLE SODIUM 40 MG IV SOLR
40.0000 mg | Freq: Two times a day (BID) | INTRAVENOUS | Status: DC
  Administered 2021-03-30 – 2021-03-31 (×2): 40 mg via INTRAVENOUS
  Filled 2021-03-30 (×3): qty 40

## 2021-03-30 MED ORDER — GABAPENTIN 100 MG PO CAPS
100.0000 mg | ORAL_CAPSULE | Freq: Two times a day (BID) | ORAL | Status: DC
  Administered 2021-03-30 – 2021-03-31 (×3): 100 mg via ORAL
  Filled 2021-03-30 (×3): qty 1

## 2021-03-30 NOTE — Plan of Care (Signed)
  Problem: Education: Goal: Knowledge of General Education information will improve Description Including pain rating scale, medication(s)/side effects and non-pharmacologic comfort measures Outcome: Progressing   Problem: Health Behavior/Discharge Planning: Goal: Ability to manage health-related needs will improve Outcome: Progressing   

## 2021-03-30 NOTE — Progress Notes (Signed)
Patient asked /given decaf coffee and crackers, sit pt up with his tray table across. Few moments later, while walking the hallway, heard patiend coughing vigorously, checked on him, he was gasping for air, restless and noticed him having nosebleed that already stopped. Put O2  on pt via [place] at 3L. pt. then cough up thick phlegm with blood tinge in it. VSS, NAD. Paged MD R[place] to make aware, orders received and implemented. Will continue to monitor pt.

## 2021-03-30 NOTE — Evaluation (Signed)
Clinical/Bedside Swallow Evaluation Patient Details  Name: [name] MRN: [id number] Date of Birth: 09/05/1945  Today's Date: 03/30/2021 Time: SLP Start Time (ACUTE ONLY): 0840 SLP Stop Time (ACUTE ONLY): 6720 SLP Time Calculation (min) (ACUTE ONLY): 15 min  Past Medical History:  Past Medical History:  Diagnosis Date   Arthritis    "joints tighten up sometimes" (03/27/2104)   Carcinoma of liver, hepatocellular ([place]) 06/30/2020   Chronic lower back pain    Coronary artery disease involving native coronary artery of native heart without angina pectoris    Severe left main disease at catheterization July 2015  CABG x3 with a LIMA to the LAD, SVG to the OM, SVG to the PDA on 03/31/14. EF 60% by cath.    Diastolic heart failure (HCC)    GERD without esophagitis 08/04/2010   Hepatic cirrhosis ([place])    a. Dx 01/2014 - CT a/p    History of blood transfusion    "related to bleeding ulcers"   History of concussion    1976--  NO RESIDUAL   History of GI bleed    a. UGIB 07/2012;  b. 01/2014 admission with GIB/FOB stool req 1U prbc's->EGD showed portal gastropathy, barrett's esoph, and chronic active h. pylori gastritis.   History of gout    2007 &  2008  LEFT LEG-- NO ISSUE SINCE   Hyperlipidemia    Iron deficiency anemia    Kidney stones    OA (osteoarthritis of spine)    LOWER BACK--  INTERMITTANT LEFT LEG NUMBNESS   OSA (obstructive sleep apnea)    PULMOLOGIST-  DR CLANCE--  MODERATE OSA  STARTED CPAP 2012--  BUT CURRENTLY HAS NOT USED PAST 6 MONTHS   Phimosis    a. s/p circumcision 2015.   Type 2 diabetes mellitus ([place])    Unspecified essential hypertension    Past Surgical History:  Past Surgical History:  Procedure Laterality Date   ANKLE FRACTURE SURGERY Right 1989   "plate put in"   APPENDECTOMY  05-16-2004   open   BIOPSY  01/08/2021   Procedure: BIOPSY;  Surgeon: Wilford Corner, MD;  Location: [facility];  Service: Endoscopy;;   CIRCUMCISION N/A 09/09/2013    Procedure: CIRCUMCISION ADULT;  Surgeon: Bernestine Amass, MD;  [facility];  Service: Urology;  Laterality: N/A;   COLECTOMY  05-16-2004   COLONOSCOPY WITH PROPOFOL N/A 11/20/2020   Procedure: COLONOSCOPY WITH PROPOFOL;  Surgeon: Ronald Lobo, MD;  Location: [facility];  Service: Endoscopy;  Laterality: N/A;   CORONARY ANGIOPLASTY WITH STENT PLACEMENT  06/28/2012  DR COOPER   PCI W/  X1 DES to [place]. LAD/  LM  40% OSTIAL & 50-60% DISTAL /  50% PROX LCX/  30-40% PROX RCA & 50% MID RCA/   LVEF 65-70%   CORONARY ARTERY BYPASS GRAFT N/A 03/31/2014   Procedure: CORONARY ARTERY BYPASS GRAFTING (CABG) times 3 using left internal mammary artery and right saphenous vein.;  Surgeon: Melrose Nakayama, MD;  Location: [place];  Service: Open Heart Surgery;  Laterality: N/A;   DOBUTAMINE STRESS ECHO  06-08-2012   MODERATE HYPOKINESIS/ ISCHEMIA MID INFERIOR WALL   ESOPHAGOGASTRODUODENOSCOPY  08/15/2012   Procedure: ESOPHAGOGASTRODUODENOSCOPY (EGD);  Surgeon: Wonda Horner, MD;  [facility] END[facility]COPY;  Service: Endoscopy;  Laterality: N/A;   ESOPHAGOGASTRODUODENOSCOPY N/A 02/17/2014   Procedure: ESOPHAGOGASTRODUODENOSCOPY (EGD);  Surgeon: Jeryl Columbia, MD;  Location[facility];  Service: Endoscopy;  Laterality: N/A;   ESOPHAGOGASTRODUODENOSCOPY N/A 04/17/2020   Procedure: ESOPHAGOGASTRODUODENOSCOPY (  EGD);  Surgeon: Ronnette Juniper, MD;  Location: [place];  Service: Gastroenterology;  Laterality: N/A;   ESOPHAGOGASTRODUODENOSCOPY N/A 09/11/2020   Procedure: ESOPHAGOGASTRODUODENOSCOPY (EGD);  Surgeon: Clarene Essex, MD;  Location: [facility];  Service: Endoscopy;  Laterality: N/A;   ESOPHAGOGASTRODUODENOSCOPY  10/09/2020   ESOPHAGOGASTRODUODENOSCOPY N/A 10/09/2020   Procedure: ESOPHAGOGASTRODUODENOSCOPY (EGD);  Surgeon: Otis Brace, MD;  Location: [facility] ENDOSCOPY;  Service: Gastroenterology;  Laterality: N/A;   ESOPHAGOGASTRODUODENOSCOPY N/A 01/08/2021   Procedure:  ESOPHAGOGASTRODUODENOSCOPY (EGD);  Surgeon: Wilford Corner, MD;  Location: [facility];  Service: Endoscopy;  Laterality: N/A;   ESOPHAGOGASTRODUODENOSCOPY (EGD) WITH PROPOFOL N/A 09/30/2017   Procedure: ESOPHAGOGASTRODUODENOSCOPY (EGD) WITH PROPOFOL;  Surgeon: Wonda Horner, MD;  Location: [facility] ENDOSCOPY;  Service: Endoscopy;  Laterality: N/A;   ESOPHAGOGASTRODUODENOSCOPY (EGD) WITH PROPOFOL N/A 12/20/2017   Procedure: ESOPHAGOGASTRODUODENOSCOPY (EGD) WITH PROPOFOL;  Surgeon: Clarene Essex, MD;  Location: [place];  Service: Endoscopy;  Laterality: N/A;   ESOPHAGOGASTRODUODENOSCOPY (EGD) WITH PROPOFOL N/A 08/10/2020   Procedure: ESOPHAGOGASTRODUODENOSCOPY (EGD) WITH PROPOFOL;  Surgeon: Wilford Corner, MD;  Location: [place];  Service: Endoscopy;  Laterality: N/A;   ESOPHAGOGASTRODUODENOSCOPY (EGD) WITH PROPOFOL N/A 11/20/2020   Procedure: ESOPHAGOGASTRODUODENOSCOPY (EGD) WITH PROPOFOL;  Surgeon: Ronald Lobo, MD;  [facility];  Service: Endoscopy;  Laterality: N/A;   ESOPHAGOGASTRODUODENOSCOPY (EGD) WITH PROPOFOL N/A 02/28/2021   Procedure: ESOPHAGOGASTRODUODENOSCOPY (EGD) WITH PROPOFOL;  Surgeon: Clarene Essex, MD;  Location: [facility];  Service: Endoscopy;  Laterality: N/A;   GIVENS CAPSULE STUDY N/A 11/20/2020   Procedure: GIVENS CAPSULE STUDY;  Surgeon: Ronald Lobo, MD;  Location[facility];  Service: Endoscopy;  Laterality: N/A;   HERNIA REPAIR     INTRAOPERATIVE TRANSESOPHAGEAL ECHOCARDIOGRAM N/A 03/31/2014   Procedure: INTRAOPERATIVE TRANSESOPHAGEAL ECHOCARDIOGRAM;  Surgeon: Melrose Nakayama, MD;  Location: [place];  Service: Open Heart Surgery;  Laterality: N/A;   IR ANGIOGRAM SELECTIVE EACH ADDITIONAL VESSEL  06/16/2020   IR ANGIOGRAM SELECTIVE EACH ADDITIONAL VESSEL  06/16/2020   IR ANGIOGRAM SELECTIVE EACH ADDITIONAL VESSEL  06/16/2020   IR ANGIOGRAM SELECTIVE EACH ADDITIONAL VESSEL  06/16/2020   IR ANGIOGRAM SELECTIVE EACH ADDITIONAL VESSEL  06/16/2020    IR ANGIOGRAM SELECTIVE EACH ADDITIONAL VESSEL  06/16/2020   IR ANGIOGRAM SELECTIVE EACH ADDITIONAL VESSEL  06/16/2020   IR ANGIOGRAM SELECTIVE EACH ADDITIONAL VESSEL  06/16/2020   IR ANGIOGRAM SELECTIVE EACH ADDITIONAL VESSEL  06/16/2020   IR ANGIOGRAM SELECTIVE EACH ADDITIONAL VESSEL  06/30/2020   IR ANGIOGRAM SELECTIVE EACH ADDITIONAL VESSEL  06/30/2020   IR ANGIOGRAM VISCERAL SELECTIVE  06/16/2020   IR ANGIOGRAM VISCERAL SELECTIVE  06/30/2020   IR EMBO ARTERIAL NOT HEMORR HEMANG INC GUIDE ROADMAPPING  06/16/2020   IR EMBO TUMOR ORGAN ISCHEMIA INFARCT INC GUIDE ROADMAPPING  06/30/2020   IR EMBO VENOUS NOT HEMORR HEMANG  INC GUIDE ROADMAPPING  01/13/2021   IR IVUS EACH ADDITIONAL NON CORONARY VESSEL  01/13/2021   IR RADIOLOGIST EVAL & MGMT  05/06/2020   IR RADIOLOGIST EVAL & MGMT  05/20/2020   IR TIPS  01/13/2021   IR US GUIDE VASC ACCESS RIGHT  06/16/2020   IR US GUIDE VASC ACCESS RIGHT  06/30/2020   IR US GUIDE VASC ACCESS RIGHT  01/13/2021   LAPAROSCOPIC UMBILICAL HERNIA REPAIR W/ MESH  06-06-2011   LEFT HEART CATHETERIZATION WITH CORONARY ANGIOGRAM N/A 03/28/2014   Procedure: LEFT HEART CATHETERIZATION WITH CORONARY ANGIOGRAM;  Surgeon: Sinclair Grooms, MD;  Location[facility] CATH LAB;  Service: Cardiovascular;  Laterality: N/A;   LIPOMA EXCISION Left 08/25/2017  Procedure: EXCISION LIPOMA LEFT POSTERIOR THIGH;  Surgeon: Clovis Riley, MD;  Location: [place];  Service: General;  Laterality: Left;   NEPHROLITHOTOMY  1990'S   OPEN APPENDECTOMY W/ PARTIAL CECECTOMY  05-16-2004   PERCUTANEOUS CORONARY STENT INTERVENTION (PCI-S) N/A 06/28/2012   Procedure: PERCUTANEOUS CORONARY STENT INTERVENTION (PCI-S);  Surgeon: Sherren Mocha, MD;  Location[facility] CATH LAB;  Service: Cardiovascular;  Laterality: N/A;   POLYPECTOMY  11/20/2020   Procedure: POLYPECTOMY;  Surgeon: Ronald Lobo, MD;  Location: [facility];  Service: Endoscopy;;   RADIOLOGY WITH ANESTHESIA N/A 01/13/2021   Procedure: IR WITH  ANESTHESIA - TIPS;  Surgeon: Suzette Battiest, MD;  Location: [employer];  Service: Radiology;  Laterality: N/A;   HPI:  [name] is a 75 y.o. male with medical history significant of cirrhosis 2/2 alcohol use, HCC s/p treatment, chronic hepatic encephalopathy s/p TIPS, CAD, GERD, OA, DM2, COPD, HTN who presents with confusion, swelling. Admitted with hepatic encephalopathy.   Assessment / Plan / Recommendation Clinical Impression  Pts swallow appeared grossly functional during clinical interactions with SLP this date. Note pt with documented episodic difficulty including coughing with PO crackers and thin liquids this am, per RN. Pt denies hx of dysphagia. 3 oz water challenge was unremarkable. No overt s/sx of aspiration with any PO this date. Delayed throat clear noted x1 during PO consumption. Pt with baseline edentulous status and reports consuming regular thin liquids with exception of some raw vegetables. Recommend regular thin liquid diet with meds as tolerated and safe swallow precautions. SLP to follow up to ensure diet tolerance.  SLP Visit Diagnosis: Dysphagia, unspecified (R13.10)    Aspiration Risk  Mild aspiration risk    Diet Recommendation   Regular, thin liquids   Medication Administration: Whole meds with liquid    Other  Recommendations Oral Care Recommendations: Oral care BID   Follow up Recommendations 24 hour supervision/assistance      Frequency and Duration min 1 x/week  1 week       Prognosis Prognosis for Safe Diet Advancement: Good Barriers to Reach Goals: Time post onset      Swallow Study   General Date of Onset: 03/28/21 HPI: [name] is a 75 y.o. male with medical history significant of cirrhosis 2/2 alcohol use, HCC s/p treatment, chronic hepatic encephalopathy s/p TIPS, CAD, GERD, OA, DM2, COPD, HTN who presents with confusion, swelling. Admitted with hepatic encephalopathy. Type of Study: Bedside Swallow Evaluation Previous Swallow  Assessment: none on file Diet Prior to this Study: NPO Temperature Spikes Noted: No Respiratory Status: Nasal cannula History of Recent Intubation: No Behavior/Cognition: Alert;Cooperative Oral Cavity Assessment: Dry Oral Care Completed by SLP: Yes Oral Cavity - Dentition: Edentulous Vision: Functional for self-feeding Self-Feeding Abilities: Needs assist Patient Positioning: Upright in bed Baseline Vocal Quality: Normal Volitional Cough: Strong Volitional Swallow: Able to elicit    Oral/Motor/Sensory Function Overall Oral Motor/Sensory Function: Generalized oral weakness   Ice Chips Ice chips: Within functional limits   Thin Liquid Thin Liquid: Within functional limits Presentation: Cup;Straw    Nectar Thick Nectar Thick Liquid: Not tested   Honey Thick Honey Thick Liquid: Not tested   Puree Puree: Within functional limits Presentation: Self Fed   Solid     Solid: Impaired Presentation: Self Fed Oral Phase Impairments: Impaired mastication Oral Phase Functional Implications: Prolonged oral transit Pharyngeal Phase Impairments: Multiple swallows;Cough - Delayed      Danielle Mink H. MA, CCC-SLP [facility] Services   03/30/2021,9:06 AM

## 2021-03-30 NOTE — Consult Note (Signed)
Referring Provider: Dr. Aileen Fass Primary Care Physician:  [name], MD Primary Gastroenterologist:  Dr.Brahmbhatt  Reason for Consultation:  Anemia; Cirrhosis  HPI: [name] is a 75 y.o. male with known cirrhosis due to alcohol (reports quitting 3 months ago) and recent admit for heme positive and anemia in early July with EGD showing nonbleeding gastric varices with portal gastropathy; No esophageal varices seen. S/P TIPS 2 months ago. History of recurrent hepatic encephalopathy. Prior to this admit he had not taken his Lactulose and Rifaximin for 3 days and was confused on admit. Family reports dark stools for several weeks. Brown stool on admit that was heme positive. Having brown stool now. Denies abdominal pain. Hgb 6.4 on admit (8.2 at d/c on 03/06/21). S/P 2 U PRBCs and Hgb 8.5 yesterday.  Past Medical History:  Diagnosis Date   Arthritis    "joints tighten up sometimes" (03/27/2104)   Carcinoma of liver, hepatocellular ([place]) 06/30/2020   Chronic lower back pain    Coronary artery disease involving native coronary artery of native heart without angina pectoris    Severe left main disease at catheterization July 2015  CABG x3 with a LIMA to the LAD, SVG to the OM, SVG to the PDA on 03/31/14. EF 60% by cath.    Diastolic heart failure (HCC)    GERD without esophagitis 08/04/2010   Hepatic cirrhosis ([place])    a. Dx 01/2014 - CT a/p    History of blood transfusion    "related to bleeding ulcers"   History of concussion    1976--  NO RESIDUAL   History of GI bleed    a. UGIB 07/2012;  b. 01/2014 admission with GIB/FOB stool req 1U prbc's->EGD showed portal gastropathy, barrett's esoph, and chronic active h. pylori gastritis.   History of gout    2007 &  2008  LEFT LEG-- NO ISSUE SINCE   Hyperlipidemia    Iron deficiency anemia    Kidney stones    OA (osteoarthritis of spine)    LOWER BACK--  INTERMITTANT LEFT LEG NUMBNESS   OSA (obstructive sleep apnea)     PULMOLOGIST-  DR CLANCE--  MODERATE OSA  STARTED CPAP 2012--  BUT CURRENTLY HAS NOT USED PAST 6 MONTHS   Phimosis    a. s/p circumcision 2015.   Type 2 diabetes mellitus ([place])    Unspecified essential hypertension     Past Surgical History:  Procedure Laterality Date   ANKLE FRACTURE SURGERY Right 1989   "plate put in"   APPENDECTOMY  05-16-2004   open   BIOPSY  01/08/2021   Procedure: BIOPSY;  Surgeon: Wilford Corner, MD;  Location: [facility];  Service: Endoscopy;;   CIRCUMCISION N/A 09/09/2013   Procedure: CIRCUMCISION ADULT;  Surgeon: Bernestine Amass, MD;  Location: [facility];  Service: Urology;  Laterality: N/A;   COLECTOMY  05-16-2004   COLONOSCOPY WITH PROPOFOL N/A 11/20/2020   Procedure: COLONOSCOPY WITH PROPOFOL;  Surgeon: Ronald Lobo, MD;  Location: [facility];  Service: Endoscopy;  Laterality: N/A;   CORONARY ANGIOPLASTY WITH STENT PLACEMENT  06/28/2012  DR COOPER   PCI W/  X1 DES to [place]. LAD/  LM  40% OSTIAL & 50-60% DISTAL /  50% PROX LCX/  30-40% PROX RCA & 50% MID RCA/   LVEF 65-70%   CORONARY ARTERY BYPASS GRAFT N/A 03/31/2014   Procedure: CORONARY ARTERY BYPASS GRAFTING (CABG) times 3 using left internal mammary artery and right saphenous vein.;  Surgeon: Revonda Standard  Roxan Hockey, MD;  Location: [place];  Service: Open Heart Surgery;  Laterality: N/A;   DOBUTAMINE STRESS ECHO  06-08-2012   MODERATE HYPOKINESIS/ ISCHEMIA MID INFERIOR WALL   ESOPHAGOGASTRODUODENOSCOPY  08/15/2012   Procedure: ESOPHAGOGASTRODUODENOSCOPY (EGD);  Surgeon: Wonda Horner, MD;  Location: [facility] ENDOSCOPY;  Service: Endoscopy;  Laterality: N/A;   ESOPHAGOGASTRODUODENOSCOPY N/A 02/17/2014   Procedure: ESOPHAGOGASTRODUODENOSCOPY (EGD);  Surgeon: Jeryl Columbia, MD;  Location: [facility];  Service: Endoscopy;  Laterality: N/A;   ESOPHAGOGASTRODUODENOSCOPY N/A 04/17/2020   Procedure: ESOPHAGOGASTRODUODENOSCOPY (EGD);  Surgeon: Ronnette Juniper, MD;  Location: [place];  Service:  Gastroenterology;  Laterality: N/A;   ESOPHAGOGASTRODUODENOSCOPY N/A 09/11/2020   Procedure: ESOPHAGOGASTRODUODENOSCOPY (EGD);  Surgeon: Clarene Essex, MD;  Location: [facility];  Service: Endoscopy;  Laterality: N/A;   ESOPHAGOGASTRODUODENOSCOPY  10/09/2020   ESOPHAGOGASTRODUODENOSCOPY N/A 10/09/2020   Procedure: ESOPHAGOGASTRODUODENOSCOPY (EGD);  Surgeon: Otis Brace, MD;  Location: [facility] ENDOSCOPY;  Service: Gastroenterology;  Laterality: N/A;   ESOPHAGOGASTRODUODENOSCOPY N/A 01/08/2021   Procedure: ESOPHAGOGASTRODUODENOSCOPY (EGD);  Surgeon: Wilford Corner, MD;  Location: [facility];  Service: Endoscopy;  Laterality: N/A;   ESOPHAGOGASTRODUODENOSCOPY (EGD) WITH PROPOFOL N/A 09/30/2017   Procedure: ESOPHAGOGASTRODUODENOSCOPY (EGD) WITH PROPOFOL;  Surgeon: Wonda Horner, MD;  Location: [facility] ENDOSCOPY;  Service: Endoscopy;  Laterality: N/A;   ESOPHAGOGASTRODUODENOSCOPY (EGD) WITH PROPOFOL N/A 12/20/2017   Procedure: ESOPHAGOGASTRODUODENOSCOPY (EGD) WITH PROPOFOL;  Surgeon: Clarene Essex, MD;  Location: [place];  Service: Endoscopy;  Laterality: N/A;   ESOPHAGOGASTRODUODENOSCOPY (EGD) WITH PROPOFOL N/A 08/10/2020   Procedure: ESOPHAGOGASTRODUODENOSCOPY (EGD) WITH PROPOFOL;  Surgeon: Wilford Corner, MD;  Location: [place];  Service: Endoscopy;  Laterality: N/A;   ESOPHAGOGASTRODUODENOSCOPY (EGD) WITH PROPOFOL N/A 11/20/2020   Procedure: ESOPHAGOGASTRODUODENOSCOPY (EGD) WITH PROPOFOL;  Surgeon: Ronald Lobo, MD;  Location: [facility];  Service: Endoscopy;  Laterality: N/A;   ESOPHAGOGASTRODUODENOSCOPY (EGD) WITH PROPOFOL N/A 02/28/2021   Procedure: ESOPHAGOGASTRODUODENOSCOPY (EGD) WITH PROPOFOL;  Surgeon: Clarene Essex, MD;  Location: [facility];  Service: Endoscopy;  Laterality: N/A;   GIVENS CAPSULE STUDY N/A 11/20/2020   Procedure: GIVENS CAPSULE STUDY;  Surgeon: Ronald Lobo, MD;  Location: [facility];  Service: Endoscopy;  Laterality: N/A;   HERNIA REPAIR     INTRAOPERATIVE  TRANSESOPHAGEAL ECHOCARDIOGRAM N/A 03/31/2014   Procedure: INTRAOPERATIVE TRANSESOPHAGEAL ECHOCARDIOGRAM;  Surgeon: Melrose Nakayama, MD;  Location: [place];  Service: Open Heart Surgery;  Laterality: N/A;   IR ANGIOGRAM SELECTIVE EACH ADDITIONAL VESSEL  06/16/2020   IR ANGIOGRAM SELECTIVE EACH ADDITIONAL VESSEL  06/16/2020   IR ANGIOGRAM SELECTIVE EACH ADDITIONAL VESSEL  06/16/2020   IR ANGIOGRAM SELECTIVE EACH ADDITIONAL VESSEL  06/16/2020   IR ANGIOGRAM SELECTIVE EACH ADDITIONAL VESSEL  06/16/2020   IR ANGIOGRAM SELECTIVE EACH ADDITIONAL VESSEL  06/16/2020   IR ANGIOGRAM SELECTIVE EACH ADDITIONAL VESSEL  06/16/2020   IR ANGIOGRAM SELECTIVE EACH ADDITIONAL VESSEL  06/16/2020   IR ANGIOGRAM SELECTIVE EACH ADDITIONAL VESSEL  06/16/2020   IR ANGIOGRAM SELECTIVE EACH ADDITIONAL VESSEL  06/30/2020   IR ANGIOGRAM SELECTIVE EACH ADDITIONAL VESSEL  06/30/2020   IR ANGIOGRAM VISCERAL SELECTIVE  06/16/2020   IR ANGIOGRAM VISCERAL SELECTIVE  06/30/2020   IR EMBO ARTERIAL NOT HEMORR HEMANG INC GUIDE ROADMAPPING  06/16/2020   IR EMBO TUMOR ORGAN ISCHEMIA INFARCT INC GUIDE ROADMAPPING  06/30/2020   IR EMBO VENOUS NOT HEMORR HEMANG  INC GUIDE ROADMAPPING  01/13/2021   IR IVUS EACH ADDITIONAL NON CORONARY VESSEL  01/13/2021   IR RADIOLOGIST EVAL & MGMT  05/06/2020   IR RADIOLOGIST EVAL & MGMT  05/20/2020  IR TIPS  01/13/2021   IR US GUIDE VASC ACCESS RIGHT  06/16/2020   IR US GUIDE VASC ACCESS RIGHT  06/30/2020   IR US GUIDE VASC ACCESS RIGHT  01/13/2021   LAPAROSCOPIC UMBILICAL HERNIA REPAIR W/ MESH  06-06-2011   LEFT HEART CATHETERIZATION WITH CORONARY ANGIOGRAM N/A 03/28/2014   Procedure: LEFT HEART CATHETERIZATION WITH CORONARY ANGIOGRAM;  Surgeon: Sinclair Grooms, MD;  [facility] CATH LAB;  Service: Cardiovascular;  Laterality: N/A;   LIPOMA EXCISION Left 08/25/2017   Procedure: EXCISION LIPOMA LEFT POSTERIOR THIGH;  Surgeon: Clovis Riley, MD;  Location: [place];  Service: General;  Laterality: Left;    NEPHROLITHOTOMY  1990'S   OPEN APPENDECTOMY W/ PARTIAL CECECTOMY  05-16-2004   PERCUTANEOUS CORONARY STENT INTERVENTION (PCI-S) N/A 06/28/2012   Procedure: PERCUTANEOUS CORONARY STENT INTERVENTION (PCI-S);  Surgeon: Sherren Mocha, MD;  Location: [facility] CATH LAB;  Service: Cardiovascular;  Laterality: N/A;   POLYPECTOMY  11/20/2020   Procedure: POLYPECTOMY;  Surgeon: Ronald Lobo, MD;  Location: [facility];  Service: Endoscopy;;   RADIOLOGY WITH ANESTHESIA N/A 01/13/2021   Procedure: IR WITH ANESTHESIA - TIPS;  Surgeon: Suzette Battiest, MD;  Location: [place];  Service: Radiology;  Laterality: N/A;    Prior to Admission medications   Medication Sig Start Date End Date Taking? Authorizing Provider  furosemide (LASIX) 40 MG tablet Take 1 tablet (40 mg total) by mouth daily. 01/31/21  Yes Elgergawy, Silver Huguenin, MD  metFORMIN (GLUCOPHAGE) 850 MG tablet TAKE 1 TABLET(850 MG) BY MOUTH TWICE DAILY WITH A MEAL Patient taking differently: Take 850 mg by mouth daily with breakfast. 01/08/21  Yes [name], MD  Accu-Chek Softclix Lancets lancets USE AS DIRECTED TO TEST BLOOD SUGAR FOUR TIMES DAILY Patient taking differently: 1 each by Other route in the morning, at noon, in the evening, and at bedtime. 03/13/20   [name], MD  albuterol (VENTOLIN HFA) 108 (90 Base) MCG/ACT inhaler Inhale 2 puffs into the lungs every 6 (six) hours as needed for wheezing or shortness of breath. 03/01/21   Aline August, MD  blood glucose meter kit and supplies Dispense based on patient and insurance preference. Use up to four times daily as directed. (FOR ICD-10 E10.9, E11.9). Patient taking differently: 1 each by Other route See admin instructions. Dispense based on patient and insurance preference. Use up to four times daily as directed. (FOR ICD-10 E10.9, E11.9). 03/12/20   [name], MD  gabapentin (NEURONTIN) 100 MG capsule Take 100 mg by mouth 2 (two) times daily. 03/05/21   [provider]  lactulose (CHRONULAC) 10 GM/15ML solution Take 45 mLs (30 g total) by mouth 3 (three) times daily. 03/06/21   Ghimire, Henreitta Leber, MD  nitroGLYCERIN (NITROSTAT) 0.4 MG SL tablet Place 1 tablet (0.4 mg total) under the tongue every 5 (five) minutes as needed for chest pain. 06/08/16   Nahser, Wonda Cheng, MD  pantoprazole (PROTONIX) 40 MG tablet Take 1 tablet (40 mg total) by mouth daily. 03/01/21   Aline August, MD  rifaximin (XIFAXAN) 550 MG TABS tablet Take 1 tablet (550 mg total) by mouth 2 (two) times daily. 03/06/21   Ghimire, Henreitta Leber, MD  spironolactone (ALDACTONE) 50 MG tablet Take 1 tablet (50 mg total) by mouth daily. 03/07/21   Ghimire, Henreitta Leber, MD  umeclidinium-vilanterol (ANORO ELLIPTA) 62.5-25 MCG/INH AEPB Inhale 1 puff into the lungs daily. 03/12/20   [name], MD    Scheduled Meds:  Chlorhexidine Gluconate  Cloth  6 each Topical Q0600   furosemide  40 mg Oral Daily   gabapentin  100 mg Oral BID   insulin aspart  0-15 Units Subcutaneous Q4H   lactulose  30 g Oral TID   pantoprazole (PROTONIX) IV  40 mg Intravenous Q12H   rifaximin  550 mg Oral BID   sodium chloride flush  3 mL Intravenous Q12H   spironolactone  50 mg Oral Daily   umeclidinium-vilanterol  1 puff Inhalation Daily   Continuous Infusions:  cefTRIAXone (ROCEPHIN)  IV 2 g (03/30/21 1710)   PRN Meds:.albuterol, ondansetron **OR** ondansetron (ZOFRAN) IV  Allergies as of 03/28/2021 - Review Complete 03/28/2021  Allergen Reaction Noted   Fluzone quadrivalent [influenza vac split quad] Other (See Comments) 01/07/2021    Family History  Problem Relation Age of Onset   Lung cancer Sister    Cancer Sister        lung   Cancer Mother    Cancer Father        died in his 108s.   Cancer Brother        lung   Coronary artery disease Other    Diabetes Other    Colon cancer Other    Cancer Sister        lung    Social History   Socioeconomic History   Marital status: Married     Spouse name: Not on file   Number of children: Not on file   Years of education: Not on file   Highest education level: Not on file  Occupational History   Not on file  Tobacco Use   Smoking status: Former    Packs/day: 2.00    Years: 40.00    Pack years: 80.00    Types: Cigarettes    Quit date: 08/29/1996    Years since quitting: 24.6   Smokeless tobacco: Never  Vaping Use   Vaping Use: Never used  Substance and Sexual Activity   Alcohol use: Not Currently    Alcohol/week: 8.0 standard drinks    Types: 8 Cans of beer per week    Comment: 2 beers every other day   Drug use: No   Sexual activity: Yes  Other Topics Concern   Not on file  Social History Narrative   Lives in [place] with wife, son, and son's family.   Social Determinants of Health   Financial Resource Strain: Medium Risk   Difficulty of Paying Living Expenses: Somewhat hard  Food Insecurity: No Food Insecurity   Worried About Charity fundraiser in the Last Year: Never true   Ran Out of Food in the Last Year: Never true  Transportation Needs: No Transportation Needs   Lack of Transportation (Medical): No   Lack of Transportation (Non-Medical): No  Physical Activity: Not on file  Stress: Not on file  Social Connections: Not on file  Intimate Partner Violence: Not on file    Review of Systems: All negative except as stated above in HPI.  Physical Exam: Vital signs: Vitals:   03/30/21 0932 03/30/21 1734  BP: (!) 125/44 (!) 125/50  Pulse: 70 70  Resp: 19 19  Temp: 98.3 F (36.8 C) 98.2 F (36.8 C)  SpO2: 98% 98%   Last BM Date: 03/29/21 General:   Lethargic, obese, no acute distress  Head: normocephalic, atraumatic Eyes: anicteric sclera ENT: oropharynx clear Neck: supple, nontender Lungs:  Clear throughout to auscultation.   No wheezes, crackles, or rhonchi. No acute distress. Heart:  Regular rate and rhythm; no murmurs, clicks, rubs,  or gallops. Abdomen: soft, nontender, nondistended, +BS   Rectal:  Deferred Ext: chronic venous stasis; trace LE edema Neuro: oriented to person; "August 1922" for year; No asterixis   GI:  Lab Results: Recent Labs    03/28/21 1714 03/28/21 1730 03/29/21 0312 03/29/21 2158  WBC 2.3*  --   --   --   HGB 6.4* 6.8* 7.0* 8.5*  HCT 21.5* 20.0* 23.5* 28.0*  PLT 106*  --   --   --    BMET Recent Labs    03/28/21 1714 03/28/21 1730 03/29/21 0312 03/30/21 1047  NA 137 141 137 137  K 3.7 3.7 3.8 3.6  CL 109  --  110 112*  CO2 21*  --  19* 22  GLUCOSE 106*  --  86 170*  BUN 16  --  14 11  CREATININE 1.02  --  0.95 1.03  CALCIUM 8.7*  --  8.4* 8.7*   LFT Recent Labs    03/29/21 0312  PROT 6.2*  ALBUMIN 2.6*  AST 39  ALT 21  ALKPHOS 104  BILITOT 1.3*   PT/INR Recent Labs    03/28/21 1714 03/29/21 0312  LABPROT 17.1* 16.4*  INR 1.4* 1.3*     Studies/Results: No results found.  Impression/Plan: GI bleed suspect due to portal gastropathy. No signs of ongoing bleeding. Repeat EGD would be low yield with patient having one a month ago.  Hepatic encephalopathy - Clinically improving; Strongly encouraged to remain on Lactulose and Rifaximin as an outpt. PPI PO QD at discharge ok. Supportive care. F/U with Dr. Alessandra Bevels in 4-6 weeks. Will sign off. Call if questions.    LOS: 2 days   Lear Ng  03/30/2021, 6:10 PM  Questions please call [phone]

## 2021-03-30 NOTE — Progress Notes (Signed)
[facility] PROGRESS NOTE    Progress Note  [name]  MLY:[id number] DOB: 03/27/1946 DOA: 03/28/2021 PCP: [name], MD     Brief Narrative:   [name] is an 75 y.o. male past medical history of cirrhosis secondary to alcohol, hepatocellular carcinoma status posttreatment, chronic hepatic encephalopathy with a history of TIPS diabetes mellitus type 2 comes into the ED for confusion, he relates he stopped taking his medications about 3 days ago denies any fever creatinine of 1.0, albumin of 2.8 ammonia level 91 BUN 14, creatinine 0.9, INR 1.4 hemoglobin of 6.4, MCV of 77 and elevated RDW with thrombocytopenia started on IV Protonix, Rocephin lactulose and rifaximin, he was also given 1 unit of packed red blood cells his meld score was 12 with a child class C.  Of note as per family he had some bloody stools earlier this week however the stools appeared brown on admission FOBT was positive  Assessment/Plan:   Hepatic encephalopathy ([place]): Likely due to noncompliance with her lactulose and rifaximin. Meld score of 12. Started back on rifaximin and lactulose his mentation is significantly improved today. Tolerating his diet. Continue diuretic therapy.  Acute blood loss anemia: He status post 2 units of packed red blood cells hemoglobin responded appropriately 8.5 today. Family members related he had a bloody stool earlier this week. FOBT is positive for consult GI for possible intervention. TIPS 2 months ago. Continue IV Rocephin for prophylactic treatment of his BP  Diabetes mellitus type 2: A1c of 5.0, blood glucose fairly controlled is only required 2 units of insulin.  Continue current management.  Obstructive sleep apnea: Currently not on BiPAP.  Coronary artery disease/hyperlipidemia with a history of CABG x3: Continue diuretics monitor potassium denies any chest pain or shortness of breath.  Chronic diastolic heart failure: Appears to be volume  overloaded restart diuretics   DVT prophylaxis: scd Family Communication:none Status is: Inpatient  Remains inpatient appropriate because:Hemodynamically unstable  Dispo: The patient is from: Home              Anticipated d/c is to: Home              Patient currently is not medically stable to d/c.   Difficult to place patient No  Code Status:     Code Status Orders  (From admission, onward)           Start     Ordered   03/28/21 2122  Full code  Continuous        03/28/21 2121           Code Status History     Date Active Date Inactive Code Status Order ID Comments User Context   03/04/2021 1646 03/06/2021 2010 Full Code [id number]  Karmen Bongo, MD ED   02/27/2021 2018 03/01/2021 1650 Full Code [id number]  Orene Desanctis, DO ED   01/23/2021 0425 01/30/2021 2119 Full Code [id number]  Reubin Milan, MD ED   01/06/2021 1956 01/17/2021 2040 Full Code [id number]  Lenore Cordia, MD ED   11/18/2020 1623 11/23/2020 1939 Full Code [id number]  Harold Hedge, MD ED   10/08/2020 2239 10/10/2020 2352 Full Code [id number]  Elwyn Reach, MD ED   09/10/2020 2305 09/12/2020 1939 Full Code [id number]  Vianne Bulls, MD ED   08/11/2020 0351 08/18/2020 1843 Full Code [id number]  Shela Leff, MD Inpatient   08/10/2020 1957 08/11/2020 0351 DNR [id number]  Lavena Bullion, NP Inpatient   08/09/2020  2216 08/10/2020 1957 Full Code [id number]  Neena Rhymes, MD ED   04/15/2020 2216 04/23/2020 1707 Full Code [id number]  Lenore Cordia, MD ED   12/27/2019 1016 01/04/2020 1733 Full Code [id number]  Vernelle Emerald, MD ED   11/01/2019 0134 11/04/2019 1939 Full Code [id number]  Howerter, Ethelda Chick, DO ED   07/29/2019 2052 08/07/2019 1608 Full Code [id number]  Lenore Cordia, MD ED   12/24/2018 1128 12/25/2018 1915 Full Code [id number]  Norval Morton, MD ED   12/18/2017 2224 12/22/2017 1431 Full Code [id number]  Vianne Bulls, MD ED   11/20/2017 1951 11/23/2017 1716 Full Code [id number]  Norval Morton,  MD ED   09/29/2017 2212 10/05/2017 2026 Full Code [id number]  Norval Morton, MD ED   03/31/2014 1245 04/03/2014 1638 Full Code [id number]  John Giovanni, PA-C Inpatient   03/28/2014 0903 03/31/2014 1245 Full Code [id number]  Belva Crome, MD Inpatient   03/27/2014 1742 03/28/2014 0903 Full Code [id number]  Rogelia Mire, NP Inpatient   02/17/2014 0313 02/18/2014 1339 Full Code [id number]  Elmarie Shiley, MD Inpatient   08/13/2012 2145 08/17/2012 1438 Full Code [id number]  Almyra Free, RN Inpatient         IV Access:   Peripheral IV   Procedures and diagnostic studies:   DG Chest Port 1 View  Result Date: 03/28/2021 CLINICAL DATA:  Altered mental status. EXAM: PORTABLE CHEST 1 VIEW COMPARISON:  Chest radiograph 01/27/2021. FINDINGS: Monitoring leads overlie the patient. Stable cardiomegaly. Interval increase in diffuse bilateral interstitial opacities. No large pleural effusion or pneumothorax. IMPRESSION: Cardiomegaly and interstitial edema. Electronically Signed   By: Lovey Newcomer M.D.   On: 03/28/2021 17:31     Medical Consultants:   None.   Subjective:    [name] feels better tolerating his diet he relates he was very hungry  Objective:    Vitals:   03/30/21 0430 03/30/21 0538 03/30/21 0834 03/30/21 0932  BP: (!) 141/88 (!) 185/76  (!) 125/44  Pulse: 75 88 73 70  Resp: 17 20 18 19   Temp: 97.9 F (36.6 C) 98.4 F (36.9 C)  98.3 F (36.8 C)  TempSrc: Oral Oral    SpO2: 99% 99% 96% 98%  Weight:      Height:       SpO2: 98 % O2 Flow Rate (L/min): 1 L/min FiO2 (%): (!) 1 %   Intake/Output Summary (Last 24 hours) at 03/30/2021 0958 Last data filed at 03/30/2021 0800 Gross per 24 hour  Intake 735 ml  Output 2701 ml  Net -1966 ml   Filed Weights   03/28/21 1710 03/29/21 2042  Weight: 108.9 kg 108.9 kg    Exam: General exam: In no acute distress. Respiratory system: Good air movement and clear to auscultation. Cardiovascular system: S1 & S2 heard, RRR.  No JVD. Gastrointestinal system: Abdomen is nondistended, soft and nontender.  Extremities: No pedal edema. Skin: No rashes, lesions or ulcers  Data Reviewed:    Labs: Basic Metabolic Panel: Recent Labs  Lab 03/28/21 1714 03/28/21 1730 03/29/21 0312  NA 137 141 137  K 3.7 3.7 3.8  CL 109  --  110  CO2 21*  --  19*  GLUCOSE 106*  --  86  BUN 16  --  14  CREATININE 1.02  --  0.95  CALCIUM 8.7*  --  8.4*    GFR Estimated Creatinine Clearance: 80.4 mL/min (by C-G formula based on SCr of  0.95 mg/dL). Liver Function Tests: Recent Labs  Lab 03/28/21 1714 03/29/21 0312  AST 30 39  ALT 22 21  ALKPHOS 119 104  BILITOT 1.6* 1.3*  PROT 6.4* 6.2*  ALBUMIN 2.8* 2.6*    No results for input(s): LIPASE, AMYLASE in the last 168 hours. Recent Labs  Lab 03/28/21 1714  AMMONIA 91*    Coagulation profile Recent Labs  Lab 03/28/21 1714 03/29/21 0312  INR 1.4* 1.3*    COVID-19 Labs  No results for input(s): DDIMER, FERRITIN, LDH, CRP in the last 72 hours.  Lab Results  Component Value Date   SARSCOV2NAA NEGATIVE 03/28/2021   SARSCOV2NAA NEGATIVE 03/04/2021   [place] NEGATIVE 02/27/2021   [place] NEGATIVE 01/22/2021    CBC: Recent Labs  Lab 03/28/21 1714 03/28/21 1730 03/29/21 0312 03/29/21 2158  WBC 2.3*  --   --   --   NEUTROABS 1.7  --   --   --   HGB 6.4* 6.8* 7.0* 8.5*  HCT 21.5* 20.0* 23.5* 28.0*  MCV 77.3*  --   --   --   PLT 106*  --   --   --     Cardiac Enzymes: No results for input(s): CKTOTAL, CKMB, CKMBINDEX, TROPONINI in the last 168 hours. BNP (last 3 results) No results for input(s): PROBNP in the last 8760 hours. CBG: Recent Labs  Lab 03/29/21 1717 03/29/21 2043 03/30/21 0010 03/30/21 0434 03/30/21 0808  GLUCAP 83 127* 98 91 128*    D-Dimer: No results for input(s): DDIMER in the last 72 hours. Hgb A1c: Recent Labs    03/28/21 2122  HGBA1C 5.0    Lipid Profile: No results for input(s): CHOL, HDL, LDLCALC,  TRIG, CHOLHDL, LDLDIRECT in the last 72 hours. Thyroid function studies: No results for input(s): TSH, T4TOTAL, T3FREE, THYROIDAB in the last 72 hours.  Invalid input(s): FREET3 Anemia work up: No results for input(s): VITAMINB12, FOLATE, FERRITIN, TIBC, IRON, RETICCTPCT in the last 72 hours. Sepsis Labs: Recent Labs  Lab 03/28/21 1714  WBC 2.3*  LATICACIDVEN 1.5    Microbiology Recent Results (from the past 240 hour(s))  Resp Panel by RT-PCR (Flu A&B, Covid) Nasopharyngeal Swab     Status: None   Collection Time: 03/28/21  6:48 PM   Specimen: Nasopharyngeal Swab; Nasopharyngeal(NP) swabs in vial transport medium  Result Value Ref Range Status   SARS Coronavirus 2 by RT PCR NEGATIVE NEGATIVE Final    Comment: (NOTE) SARS-CoV-2 target nucleic acids are NOT DETECTED.  The SARS-CoV-2 RNA is generally detectable in upper respiratory specimens during the acute phase of infection. The lowest concentration of SARS-CoV-2 viral copies this assay can detect is 138 copies/mL. A negative result does not preclude SARS-Cov-2 infection and should not be used as the sole basis for treatment or other patient management decisions. A negative result may occur with  improper specimen collection/handling, submission of specimen other than nasopharyngeal swab, presence of viral mutation(s) within the areas targeted by this assay, and inadequate number of viral copies(<138 copies/mL). A negative result must be combined with clinical observations, patient history, and epidemiological information. The expected result is Negative.  Fact Sheet for Patients:  EntrepreneurPulse.com.au  Fact Sheet for Healthcare Providers:  IncredibleEmployment.be  This test is no t yet approved or cleared by the [place] FDA and  has been authorized for detection and/or diagnosis of SARS-CoV-2 by FDA under an Emergency Use Authorization (EUA). This EUA will remain  in effect  (meaning this test can be used) for  the duration of the COVID-19 declaration under Section 564(b)(1) of the Act, 21 U.S.C.section 360bbb-3(b)(1), unless the authorization is terminated  or revoked sooner.       Influenza A by PCR NEGATIVE NEGATIVE Final   Influenza B by PCR NEGATIVE NEGATIVE Final    Comment: (NOTE) The Xpert Xpress SARS-CoV-2/FLU/RSV plus assay is intended as an aid in the diagnosis of influenza from Nasopharyngeal swab specimens and should not be used as a sole basis for treatment. Nasal washings and aspirates are unacceptable for Xpert Xpress SARS-CoV-2/FLU/RSV testing.  Fact Sheet for Patients: EntrepreneurPulse.com.au  Fact Sheet for Healthcare Providers: IncredibleEmployment.be  This test is not yet approved or cleared by the [place] FDA and has been authorized for detection and/or diagnosis of SARS-CoV-2 by FDA under an Emergency Use Authorization (EUA). This EUA will remain in effect (meaning this test can be used) for the duration of the COVID-19 declaration under Section 564(b)(1) of the Act, 21 U.S.C. section 360bbb-3(b)(1), unless the authorization is terminated or revoked.  Performed at [facility], [place] [address], [place], [place] [address]      Medications:    Chlorhexidine Gluconate Cloth  6 each Topical Q0600   furosemide  40 mg Oral Daily   insulin aspart  0-15 Units Subcutaneous Q4H   lactulose  30 g Oral TID   rifaximin  550 mg Oral BID   sodium chloride flush  3 mL Intravenous Q12H   spironolactone  50 mg Oral Daily   umeclidinium-vilanterol  1 puff Inhalation Daily   Continuous Infusions:  cefTRIAXone (ROCEPHIN)  IV 2 g (03/29/21 1801)      LOS: 2 days   Charlynne Cousins  [facility]  03/30/2021, 9:58 AM

## 2021-03-31 DIAGNOSIS — K922 Gastrointestinal hemorrhage, unspecified: Secondary | ICD-10-CM | POA: Diagnosis not present

## 2021-03-31 DIAGNOSIS — N1831 Chronic kidney disease, stage 3a: Secondary | ICD-10-CM

## 2021-03-31 DIAGNOSIS — R69 Illness, unspecified: Secondary | ICD-10-CM | POA: Diagnosis not present

## 2021-03-31 DIAGNOSIS — K7290 Hepatic failure, unspecified without coma: Secondary | ICD-10-CM | POA: Diagnosis not present

## 2021-03-31 LAB — GLUCOSE, CAPILLARY
Glucose-Capillary: 122 mg/dL — ABNORMAL HIGH (ref 70–99)
Glucose-Capillary: 85 mg/dL (ref 70–99)
Glucose-Capillary: 90 mg/dL (ref 70–99)

## 2021-03-31 SURGERY — ESOPHAGOGASTRODUODENOSCOPY (EGD) WITH PROPOFOL
Anesthesia: Monitor Anesthesia Care

## 2021-03-31 MED ORDER — LACTULOSE 10 GM/15ML PO SOLN: 30.0000 g | Freq: Three times a day (TID) | ORAL | 1 refills | Status: DC

## 2021-03-31 MED ORDER — RIFAXIMIN 550 MG PO TABS
550.0000 mg | ORAL_TABLET | Freq: Two times a day (BID) | ORAL | 0 refills | Status: DC
Start: 2021-03-31 — End: 2021-05-05

## 2021-03-31 MED ORDER — PANTOPRAZOLE SODIUM 40 MG PO TBEC: 40.0000 mg | DELAYED_RELEASE_TABLET | Freq: Every day | ORAL | 0 refills | Status: DC

## 2021-03-31 NOTE — Care Management (Signed)
Verified w liaison that patient is active with [facility]

## 2021-03-31 NOTE — Discharge Summary (Signed)
Physician Discharge Summary  [name] DGL:[id number] DOB: 1946-01-04 DOA: 03/28/2021  PCP: [name], MD  Admit date: 03/28/2021 Discharge date: 04/01/2021  Admitted From: home  Disposition:  home   Recommendations for Outpatient Follow-up:  F/u on HB and Iron levels please  Home Health:  none  Discharge Condition:  stable   CODE STATUS:  full code   Consultations: GI  Procedures/Studies: none   Discharge Diagnoses:  Principal Problem:   Acute GI bleeding Active Problems:   Hepatic encephalopathy (HCC)   Type 2 diabetes with complication (HCC)   History of gout   OSA (obstructive sleep apnea)   Coronary artery disease involving native coronary artery of native heart without angina pectoris   Hyperlipidemia with target LDL less than 70   Hepatic cirrhosis (HCC)   S/P CABG x 3   CKD (chronic kidney disease), stage III (HCC)   (HFpEF) heart failure with preserved ejection fraction (HCC)   Alcoholic cirrhosis of liver with ascites ([place])   Hepatocellular carcinoma ([place])     Brief Summary: [name] is an 75 y.o. male past medical history of cirrhosis secondary to alcohol, hepatocellular carcinoma status posttreatment, chronic hepatic encephalopathy with a history of TIPS diabetes mellitus type 2 comes into the ED for confusion, he relates he stopped taking his medications about 3 days ago denies any fever creatinine of 1.0, albumin of 2.8 ammonia level 91 BUN 14, creatinine 0.9, INR 1.4 hemoglobin of 6.4, MCV of 77 and elevated RDW with thrombocytopenia started on IV Protonix, Rocephin lactulose and rifaximin, he was also given 1 unit of packed red blood cells his meld score was 12 with a child pugh class C. Per family he had some bloody stools earlier this week however the stools appeared brown on admission FOBT was positive.  Hospital Course:  Acute blood loss anemia - s/p 2 U PRBC for Hb of 6.4 - GI suspected it's due to portal gastropathy, recommends  daily PPI and does not recommend further work up - f/u with GI in 4-6 wks  Recurrent hepatic encephalopathy Cirrhosis due to ETOH abuse, s/p TIPS - due to poor compliance with Lactulose and Xifaxin - stressed importance of compliance today  DM2 - cont home meds Hemoglobin A1C    Component Value Date/Time   HGBA1C 5.0 03/28/2021 2122     Discharge Exam: Vitals:   03/31/21 0831 03/31/21 0831  BP:  140/65  Pulse: 72 73  Resp: 18 16  Temp:  99.8 F (37.7 C)  SpO2:  99%   Vitals:   03/30/21 2035 03/31/21 0505 03/31/21 0831 03/31/21 0831  BP: (!) 122/52 130/65  140/65  Pulse: 68 72 72 73  Resp: '18 18 18 16  ' Temp: 98.1 F (36.7 C) 98 F (36.7 C)  99.8 F (37.7 C)  TempSrc: Oral   Oral  SpO2: 94% 100%  99%  Weight:      Height:        General: Pt is alert, awake, not in acute distress Cardiovascular: RRR, S1/S2 +, no rubs, no gallops Respiratory: CTA bilaterally, no wheezing, no rhonchi Abdominal: Soft, NT, ND, bowel sounds + Extremities: no edema, no cyanosis   Discharge Instructions  Discharge Instructions     Diet - low sodium heart healthy   Complete by: As directed    Increase activity slowly   Complete by: As directed       Allergies as of 03/31/2021       Reactions  Fluzone Quadrivalent [influenza Vac Split Quad] Other (See Comments)   The patient stated, in 10/2020: "I am not taking any more flu shots. It liked to have killed me."        Medication List     TAKE these medications    Accu-Chek Softclix Lancets lancets USE AS DIRECTED TO TEST BLOOD SUGAR FOUR TIMES DAILY What changed: See the new instructions.   albuterol 108 (90 Base) MCG/ACT inhaler Commonly known as: VENTOLIN HFA Inhale 2 puffs into the lungs every 6 (six) hours as needed for wheezing or shortness of breath.   Anoro Ellipta 62.5-25 MCG/INH Aepb Generic drug: umeclidinium-vilanterol Inhale 1 puff into the lungs daily.   blood glucose meter kit and supplies Dispense  based on patient and insurance preference. Use up to four times daily as directed. (FOR ICD-10 E10.9, E11.9). What changed:  how much to take how to take this when to take this   furosemide 40 MG tablet Commonly known as: LASIX Take 1 tablet (40 mg total) by mouth daily.   gabapentin 100 MG capsule Commonly known as: NEURONTIN Take 100 mg by mouth 2 (two) times daily.   lactulose 10 GM/15ML solution Commonly known as: CHRONULAC Take 45 mLs (30 g total) by mouth 3 (three) times daily.   metFORMIN 850 MG tablet Commonly known as: GLUCOPHAGE TAKE 1 TABLET(850 MG) BY MOUTH TWICE DAILY WITH A MEAL What changed: See the new instructions.   nitroGLYCERIN 0.4 MG SL tablet Commonly known as: NITROSTAT Place 1 tablet (0.4 mg total) under the tongue every 5 (five) minutes as needed for chest pain.   pantoprazole 40 MG tablet Commonly known as: PROTONIX Take 1 tablet (40 mg total) by mouth daily.   rifaximin 550 MG Tabs tablet Commonly known as: XIFAXAN Take 1 tablet (550 mg total) by mouth 2 (two) times daily.   spironolactone 50 MG tablet Commonly known as: ALDACTONE Take 1 tablet (50 mg total) by mouth daily.        Follow-up Information     [facility] Follow up.   Specialty: Home Health Services Why: For resumption of home health services. RN and physical therapy Contact information: [address] [place] [address] [phone]                Allergies  Allergen Reactions   Fluzone Quadrivalent [Influenza Vac Split Quad] Other (See Comments)    The patient stated, in 10/2020: "I am not taking any more flu shots. It liked to have killed me."      DG Chest [facility] 1 View  Result Date: 03/28/2021 CLINICAL DATA:  Altered mental status. EXAM: PORTABLE CHEST 1 VIEW COMPARISON:  Chest radiograph 01/27/2021. FINDINGS: Monitoring leads overlie the patient. Stable cardiomegaly. Interval increase in diffuse bilateral interstitial opacities. No  large pleural effusion or pneumothorax. IMPRESSION: Cardiomegaly and interstitial edema. Electronically Signed   By: Lovey Newcomer M.D.   On: 03/28/2021 17:31     The results of significant diagnostics from this hospitalization (including imaging, microbiology, ancillary and laboratory) are listed below for reference.     Microbiology: Recent Results (from the past 240 hour(s))  Resp Panel by RT-PCR (Flu A&B, Covid) Nasopharyngeal Swab     Status: None   Collection Time: 03/28/21  6:48 PM   Specimen: Nasopharyngeal Swab; Nasopharyngeal(NP) swabs in vial transport medium  Result Value Ref Range Status   SARS Coronavirus 2 by RT PCR NEGATIVE NEGATIVE Final    Comment: (NOTE) SARS-CoV-2  target nucleic acids are NOT DETECTED.  The SARS-CoV-2 RNA is generally detectable in upper respiratory specimens during the acute phase of infection. The lowest concentration of SARS-CoV-2 viral copies this assay can detect is 138 copies/mL. A negative result does not preclude SARS-Cov-2 infection and should not be used as the sole basis for treatment or other patient management decisions. A negative result may occur with  improper specimen collection/handling, submission of specimen other than nasopharyngeal swab, presence of viral mutation(s) within the areas targeted by this assay, and inadequate number of viral copies(<138 copies/mL). A negative result must be combined with clinical observations, patient history, and epidemiological information. The expected result is Negative.  Fact Sheet for Patients:  EntrepreneurPulse.com.au  Fact Sheet for Healthcare Providers:  IncredibleEmployment.be  This test is no t yet approved or cleared by the [place] FDA and  has been authorized for detection and/or diagnosis of SARS-CoV-2 by FDA under an Emergency Use Authorization (EUA). This EUA will remain  in effect (meaning this test can be used) for the duration of  the COVID-19 declaration under Section 564(b)(1) of the Act, 21 U.S.C.section 360bbb-3(b)(1), unless the authorization is terminated  or revoked sooner.       Influenza A by PCR NEGATIVE NEGATIVE Final   Influenza B by PCR NEGATIVE NEGATIVE Final    Comment: (NOTE) The Xpert Xpress SARS-CoV-2/FLU/RSV plus assay is intended as an aid in the diagnosis of influenza from Nasopharyngeal swab specimens and should not be used as a sole basis for treatment. Nasal washings and aspirates are unacceptable for Xpert Xpress SARS-CoV-2/FLU/RSV testing.  Fact Sheet for Patients: EntrepreneurPulse.com.au  Fact Sheet for Healthcare Providers: IncredibleEmployment.be  This test is not yet approved or cleared by the [place] FDA and has been authorized for detection and/or diagnosis of SARS-CoV-2 by FDA under an Emergency Use Authorization (EUA). This EUA will remain in effect (meaning this test can be used) for the duration of the COVID-19 declaration under Section 564(b)(1) of the Act, 21 U.S.C. section 360bbb-3(b)(1), unless the authorization is terminated or revoked.  Performed at [facility], [place] [address], [place], [place] [address]      Labs: BNP (last 3 results) Recent Labs    01/06/21 1630 01/22/21 1609 03/04/21 1310  BNP 337.7* 583.0* 811.9*   Basic Metabolic Panel: Recent Labs  Lab 03/28/21 1714 03/28/21 1730 03/29/21 0312 03/30/21 1047  NA 137 141 137 137  K 3.7 3.7 3.8 3.6  CL 109  --  110 112*  CO2 21*  --  19* 22  GLUCOSE 106*  --  86 170*  BUN 16  --  14 11  CREATININE 1.02  --  0.95 1.03  CALCIUM 8.7*  --  8.4* 8.7*   Liver Function Tests: Recent Labs  Lab 03/28/21 1714 03/29/21 0312  AST 30 39  ALT 22 21  ALKPHOS 119 104  BILITOT 1.6* 1.3*  PROT 6.4* 6.2*  ALBUMIN 2.8* 2.6*   No results for input(s): LIPASE, AMYLASE in the last 168 hours. Recent Labs  Lab 03/28/21 1714  AMMONIA 91*    CBC: Recent Labs  Lab 03/28/21 1714 03/28/21 1730 03/29/21 0312 03/29/21 2158  WBC 2.3*  --   --   --   NEUTROABS 1.7  --   --   --   HGB 6.4* 6.8* 7.0* 8.5*  HCT 21.5* 20.0* 23.5* 28.0*  MCV 77.3*  --   --   --   PLT 106*  --   --   --  Cardiac Enzymes: No results for input(s): CKTOTAL, CKMB, CKMBINDEX, TROPONINI in the last 168 hours. BNP: Invalid input(s): POCBNP CBG: Recent Labs  Lab 03/30/21 1633 03/30/21 1959 03/31/21 0006 03/31/21 0351 03/31/21 0824  GLUCAP 160* 169* 122* 85 90   D-Dimer No results for input(s): DDIMER in the last 72 hours. Hgb A1c No results for input(s): HGBA1C in the last 72 hours.  Lipid Profile No results for input(s): CHOL, HDL, LDLCALC, TRIG, CHOLHDL, LDLDIRECT in the last 72 hours. Thyroid function studies No results for input(s): TSH, T4TOTAL, T3FREE, THYROIDAB in the last 72 hours.  Invalid input(s): FREET3 Anemia work up No results for input(s): VITAMINB12, FOLATE, FERRITIN, TIBC, IRON, RETICCTPCT in the last 72 hours. Urinalysis    Component Value Date/Time   COLORURINE YELLOW 03/29/2021 1153   APPEARANCEUR CLEAR 03/29/2021 1153   LABSPEC 1.017 03/29/2021 1153   PHURINE 6.0 03/29/2021 1153   GLUCOSEU NEGATIVE 03/29/2021 1153   HGBUR NEGATIVE 03/29/2021 1153   BILIRUBINUR NEGATIVE 03/29/2021 1153   KETONESUR NEGATIVE 03/29/2021 1153   PROTEINUR NEGATIVE 03/29/2021 1153   UROBILINOGEN 0.2 03/29/2014 1546   NITRITE NEGATIVE 03/29/2021 1153   LEUKOCYTESUR NEGATIVE 03/29/2021 1153   Sepsis Labs Invalid input(s): PROCALCITONIN,  WBC,  LACTICIDVEN Microbiology Recent Results (from the past 240 hour(s))  Resp Panel by RT-PCR (Flu A&B, Covid) Nasopharyngeal Swab     Status: None   Collection Time: 03/28/21  6:48 PM   Specimen: Nasopharyngeal Swab; Nasopharyngeal(NP) swabs in vial transport medium  Result Value Ref Range Status   SARS Coronavirus 2 by RT PCR NEGATIVE NEGATIVE Final    Comment: (NOTE) SARS-CoV-2 target  nucleic acids are NOT DETECTED.  The SARS-CoV-2 RNA is generally detectable in upper respiratory specimens during the acute phase of infection. The lowest concentration of SARS-CoV-2 viral copies this assay can detect is 138 copies/mL. A negative result does not preclude SARS-Cov-2 infection and should not be used as the sole basis for treatment or other patient management decisions. A negative result may occur with  improper specimen collection/handling, submission of specimen other than nasopharyngeal swab, presence of viral mutation(s) within the areas targeted by this assay, and inadequate number of viral copies(<138 copies/mL). A negative result must be combined with clinical observations, patient history, and epidemiological information. The expected result is Negative.  Fact Sheet for Patients:  EntrepreneurPulse.com.au  Fact Sheet for Healthcare Providers:  IncredibleEmployment.be  This test is no t yet approved or cleared by the [place] FDA and  has been authorized for detection and/or diagnosis of SARS-CoV-2 by FDA under an Emergency Use Authorization (EUA). This EUA will remain  in effect (meaning this test can be used) for the duration of the COVID-19 declaration under Section 564(b)(1) of the Act, 21 U.S.C.section 360bbb-3(b)(1), unless the authorization is terminated  or revoked sooner.       Influenza A by PCR NEGATIVE NEGATIVE Final   Influenza B by PCR NEGATIVE NEGATIVE Final    Comment: (NOTE) The Xpert Xpress SARS-CoV-2/FLU/RSV plus assay is intended as an aid in the diagnosis of influenza from Nasopharyngeal swab specimens and should not be used as a sole basis for treatment. Nasal washings and aspirates are unacceptable for Xpert Xpress SARS-CoV-2/FLU/RSV testing.  Fact Sheet for Patients: EntrepreneurPulse.com.au  Fact Sheet for Healthcare  Providers: IncredibleEmployment.be  This test is not yet approved or cleared by the [place] FDA and has been authorized for detection and/or diagnosis of SARS-CoV-2 by FDA under an Emergency Use Authorization (EUA). This EUA will remain  in effect (meaning this test can be used) for the duration of the COVID-19 declaration under Section 564(b)(1) of the Act, 21 U.S.C. section 360bbb-3(b)(1), unless the authorization is terminated or revoked.  Performed at [facility], [place] [address], [place], [place] [address]      Time coordinating discharge in minutes: 65  SIGNED:   Debbe Odea, MD  [facility] 04/01/2021, 4:27 PM

## 2021-03-31 NOTE — TOC Transition Note (Signed)
Transition of Care [facility]) - CM/SW Discharge Note   Patient Details  Name: [name] MRN: [id number] Date of Birth: [birth year]  [facility] CM/SW Contact:  Carles Collet, RN Phone Number: 03/31/2021, 9:13 AM   Clinical Narrative:    Damaris Schooner w patient at bedside. He would like to continue home health services provided by [facility] as established prior to admission. Orders for PT and RN added and liaison notified of DC today. Patient states that his son will be able to pick him up from the hospital today for discharge.     Final next level of care: [place] Barriers to Discharge: No Barriers Identified   Patient Goals and CMS Choice Patient states their goals for this hospitalization and ongoing recovery are:: to go home CMS Medicare.gov Compare Post Acute Care list provided to:: Patient Choice offered to / list presented to : Patient  Discharge Placement                       Discharge Plan and Services                          HH Arranged: PT, RN [facility] Agency: [place] Date [place]: 03/31/21 Time [place]: [phone] Representative spoke with at [place]: [place] ([place]) Interventions     Readmission Risk Interventions Readmission Risk Prevention Plan 01/26/2021 08/12/2020 08/12/2020  Transportation Screening Complete - Complete  PCP or Specialist Appt within 5-7 Days - - -  Not Complete comments - - -  PCP or Specialist Appt within 3-5 Days - Complete -  Home Care Screening - - -  Medication Review (RN CM) - - -  [place] or [place] - - Complete  Social Work Consult for [place] Planning/Counseling - - Complete  Palliative Care Screening - - Not Applicable  Medication Review Press photographer) Complete - -  PCP or Specialist appointment within 3-5 days of discharge Complete - -  [place] or [place] Complete - -  SW Recovery Care/Counseling Consult  Complete - -  Palliative Care Screening Not Applicable - -  [place] Patient Refused - -  Some recent data might be hidden

## 2021-03-31 NOTE — Care Management Important Message (Signed)
Important Message  Patient Details  Name: [name] MRN: [id number] Date of Birth: 1945-12-28   Medicare Important Message Given:  Yes  Patient discharge prior to IM delivery will maiI IM document to patient home address   [name] 03/31/2021, 2:05 PM

## 2021-03-31 NOTE — Progress Notes (Signed)
Noticed pinkish/reddish color of urine from foley catheter. Pt. denies complaints. Ioncall provider  MD Rathore made aware, received a call back to let dayshift team aware.

## 2021-03-31 NOTE — Consult Note (Signed)
   [facility] Inpatient Consult   03/31/2021  SUFIAN RAVI 17-Feb-1946 [id number]  [place] Organization [ACO] Patient: [name] [facility]  Patient was screened for unplanned readmission . This patient is also in an Embedded practice with  chronic disease management Embedded Care Management team.  Plan: Notification sent to the [place] Management and made aware of disposition, transition of care.  Please contact for further questions,  [name], RN BSN [facility] Liaison  [phone] business mobile phone Toll free office [phone]  Fax number: [phone] [email or web address]

## 2021-04-01 NOTE — Care Management Important Message (Signed)
Important Message  Patient Details  Name: [name] MRN: [id number] Date of Birth: 02-26-1946   Medicare Important Message Given:  Yes     [name] 04/01/2021, 3:49 PM

## 2021-04-02 ENCOUNTER — Telehealth: Payer: Self-pay | Admitting: *Deleted

## 2021-04-02 DIAGNOSIS — K922 Gastrointestinal hemorrhage, unspecified: Secondary | ICD-10-CM | POA: Diagnosis not present

## 2021-04-02 DIAGNOSIS — J449 Chronic obstructive pulmonary disease, unspecified: Secondary | ICD-10-CM | POA: Diagnosis not present

## 2021-04-02 DIAGNOSIS — I110 Hypertensive heart disease with heart failure: Secondary | ICD-10-CM | POA: Diagnosis not present

## 2021-04-02 DIAGNOSIS — E1122 Type 2 diabetes mellitus with diabetic chronic kidney disease: Secondary | ICD-10-CM | POA: Diagnosis not present

## 2021-04-02 DIAGNOSIS — R69 Illness, unspecified: Secondary | ICD-10-CM | POA: Diagnosis not present

## 2021-04-02 DIAGNOSIS — I5030 Unspecified diastolic (congestive) heart failure: Secondary | ICD-10-CM | POA: Diagnosis not present

## 2021-04-02 DIAGNOSIS — N183 Chronic kidney disease, stage 3 unspecified: Secondary | ICD-10-CM | POA: Diagnosis not present

## 2021-04-02 DIAGNOSIS — K7290 Hepatic failure, unspecified without coma: Secondary | ICD-10-CM | POA: Diagnosis not present

## 2021-04-02 DIAGNOSIS — M47819 Spondylosis without myelopathy or radiculopathy, site unspecified: Secondary | ICD-10-CM | POA: Diagnosis not present

## 2021-04-02 DIAGNOSIS — I2510 Atherosclerotic heart disease of native coronary artery without angina pectoris: Secondary | ICD-10-CM | POA: Diagnosis not present

## 2021-04-02 NOTE — Chronic Care Management (AMB) (Signed)
  Care Management   Note  04/02/2021 Name: [name] MRN: [id number] DOB: Dec 10, 1945  [name] is a 75 y.o. year old male who is a primary care patient of [name], MD and is actively engaged with the care management team. I reached out to [name] by phone today to assist with re-scheduling a follow up visit with the RN Case Manager  Follow up plan: Unsuccessful telephone outreach attempt made. A HIPAA compliant phone message was left for the patient providing contact information and requesting a return call.   Julian Hy, [place] Management  Direct Dial: [phone]

## 2021-04-05 NOTE — Chronic Care Management (AMB) (Signed)
  Care Management   Note  04/05/2021 Name: MAURIZIO GENO MRN: [id number] DOB: Nov 08, 1945  [name] is a 75 y.o. year old male who is a primary care patient of [name], MD and is actively engaged with the care management team. I reached out to [name] by phone today to assist with re-scheduling a follow up visit with the RN Case Manager  Follow up plan: 2nd attempt Unsuccessful telephone outreach attempt made. A HIPAA compliant phone message was left for the patient providing contact information and requesting a return call.    Julian Hy, [place] Management  Direct Dial: [phone]

## 2021-04-07 ENCOUNTER — Telehealth: Payer: Self-pay | Admitting: Internal Medicine

## 2021-04-07 DIAGNOSIS — K922 Gastrointestinal hemorrhage, unspecified: Secondary | ICD-10-CM | POA: Diagnosis not present

## 2021-04-07 DIAGNOSIS — R69 Illness, unspecified: Secondary | ICD-10-CM | POA: Diagnosis not present

## 2021-04-07 DIAGNOSIS — I110 Hypertensive heart disease with heart failure: Secondary | ICD-10-CM | POA: Diagnosis not present

## 2021-04-07 DIAGNOSIS — I5030 Unspecified diastolic (congestive) heart failure: Secondary | ICD-10-CM | POA: Diagnosis not present

## 2021-04-07 DIAGNOSIS — N183 Chronic kidney disease, stage 3 unspecified: Secondary | ICD-10-CM | POA: Diagnosis not present

## 2021-04-07 DIAGNOSIS — I2510 Atherosclerotic heart disease of native coronary artery without angina pectoris: Secondary | ICD-10-CM | POA: Diagnosis not present

## 2021-04-07 DIAGNOSIS — J449 Chronic obstructive pulmonary disease, unspecified: Secondary | ICD-10-CM | POA: Diagnosis not present

## 2021-04-07 DIAGNOSIS — K7290 Hepatic failure, unspecified without coma: Secondary | ICD-10-CM | POA: Diagnosis not present

## 2021-04-07 DIAGNOSIS — E1122 Type 2 diabetes mellitus with diabetic chronic kidney disease: Secondary | ICD-10-CM | POA: Diagnosis not present

## 2021-04-07 DIAGNOSIS — M47819 Spondylosis without myelopathy or radiculopathy, site unspecified: Secondary | ICD-10-CM | POA: Diagnosis not present

## 2021-04-07 NOTE — Telephone Encounter (Signed)
See below

## 2021-04-07 NOTE — Telephone Encounter (Signed)
[place] Name: [facility] Agency Name: Moreen Fowler Phone #: [phone] Service Requested: Skilled Nursing Frequency of Visits: weekly for 1 month  Every other week till end of certification period

## 2021-04-08 ENCOUNTER — Telehealth: Payer: Self-pay | Admitting: Internal Medicine

## 2021-04-08 NOTE — Telephone Encounter (Signed)
[place] Name: [facility] Agency Name: Moreen Fowler Phone #: [phone] Service Requested: PT Frequency of Visits: 1 week 1 2 week 3 1 week 4

## 2021-04-08 NOTE — Telephone Encounter (Signed)
Verbal orders given  

## 2021-04-08 NOTE — Telephone Encounter (Signed)
Okay for verbals °

## 2021-04-08 NOTE — Telephone Encounter (Signed)
Verbal orders given to [facility].

## 2021-04-08 NOTE — Telephone Encounter (Signed)
See below

## 2021-04-10 ENCOUNTER — Other Ambulatory Visit: Payer: Self-pay | Admitting: Internal Medicine

## 2021-04-13 NOTE — Chronic Care Management (AMB) (Signed)
  Care Management   Note  04/13/2021 Name: [name] MRN: [id number] DOB: 12/27/1945  TANNAR BROKER is a 75 y.o. year old male who is a primary care patient of [name], MD and is actively engaged with the care management team. I reached out to [name] by phone today to assist with re-scheduling a follow up visit with the RN Case Manager  Follow up plan: Telephone appointment with care management team member scheduled for: 04/28/2021  Julian Hy, [place], [place] Management  Direct Dial: [phone]

## 2021-04-14 DIAGNOSIS — E1122 Type 2 diabetes mellitus with diabetic chronic kidney disease: Secondary | ICD-10-CM | POA: Diagnosis not present

## 2021-04-14 DIAGNOSIS — K7290 Hepatic failure, unspecified without coma: Secondary | ICD-10-CM | POA: Diagnosis not present

## 2021-04-14 DIAGNOSIS — R69 Illness, unspecified: Secondary | ICD-10-CM | POA: Diagnosis not present

## 2021-04-14 DIAGNOSIS — N183 Chronic kidney disease, stage 3 unspecified: Secondary | ICD-10-CM | POA: Diagnosis not present

## 2021-04-14 DIAGNOSIS — K922 Gastrointestinal hemorrhage, unspecified: Secondary | ICD-10-CM | POA: Diagnosis not present

## 2021-04-14 DIAGNOSIS — I5030 Unspecified diastolic (congestive) heart failure: Secondary | ICD-10-CM | POA: Diagnosis not present

## 2021-04-14 DIAGNOSIS — I2510 Atherosclerotic heart disease of native coronary artery without angina pectoris: Secondary | ICD-10-CM | POA: Diagnosis not present

## 2021-04-14 DIAGNOSIS — M47819 Spondylosis without myelopathy or radiculopathy, site unspecified: Secondary | ICD-10-CM | POA: Diagnosis not present

## 2021-04-14 DIAGNOSIS — I110 Hypertensive heart disease with heart failure: Secondary | ICD-10-CM | POA: Diagnosis not present

## 2021-04-14 DIAGNOSIS — J449 Chronic obstructive pulmonary disease, unspecified: Secondary | ICD-10-CM | POA: Diagnosis not present

## 2021-04-16 ENCOUNTER — Telehealth: Payer: Self-pay | Admitting: Internal Medicine

## 2021-04-16 NOTE — Telephone Encounter (Signed)
Wanted to document that she went to patient home for PT visit 08.17.22  Infestation of roaches & bugs  Will not be making anymore visits until issue is resolved  Callback [phone]

## 2021-04-19 ENCOUNTER — Ambulatory Visit (INDEPENDENT_AMBULATORY_CARE_PROVIDER_SITE_OTHER): Payer: Medicare HMO | Admitting: *Deleted

## 2021-04-19 DIAGNOSIS — I5032 Chronic diastolic (congestive) heart failure: Secondary | ICD-10-CM

## 2021-04-19 DIAGNOSIS — D649 Anemia, unspecified: Secondary | ICD-10-CM

## 2021-04-19 NOTE — Telephone Encounter (Signed)
Can this be forwarded to his ccm team for help from nurse or social worker?

## 2021-04-19 NOTE — Telephone Encounter (Signed)
Fyi.

## 2021-04-19 NOTE — Chronic Care Management (AMB) (Signed)
Chronic Care Management   CCM RN Visit Note  04/19/2021 Name: [name] MRN: [id number] DOB: [birth year]  Subjective: [name] is a 75 y.o. year old male who is a primary care patient of [name], MD. The care management team was consulted for assistance with disease management and care coordination needs.    Collaboration with CMA Tanzania Duff/ PCP Dr. Sharlet Salina  for  care coordination needs  in response to provider referral for case management and/or care coordination services.   Consent to Services:  The patient was given information about Chronic Care Management services, agreed to services, and gave verbal consent prior to initiation of services.  Please see initial visit note for detailed documentation.  Patient agreed to services and verbal consent obtained.   Assessment: Review of patient past medical history, allergies, medications, health status, including review of consultants reports, laboratory and other test data, was performed as part of comprehensive evaluation and provision of chronic care management services.  CCM Care Plan Allergies  Allergen Reactions   Fluzone Quadrivalent [Influenza Vac Split Quad] Other (See Comments)    The patient stated, in 10/2020: "I am not taking any more flu shots. It liked to have killed me."   Outpatient Encounter Medications as of 04/19/2021  Medication Sig Note   Accu-Chek Softclix Lancets lancets USE AS DIRECTED TO TEST BLOOD SUGAR FOUR TIMES DAILY    albuterol (VENTOLIN HFA) 108 (90 Base) MCG/ACT inhaler Inhale 2 puffs into the lungs every 6 (six) hours as needed for wheezing or shortness of breath. 03/29/2021: 03/12/2021   blood glucose meter kit and supplies Dispense based on patient and insurance preference. Use up to four times daily as directed. (FOR ICD-10 E10.9, E11.9).    furosemide (LASIX) 40 MG tablet Take 1 tablet (40 mg total) by mouth daily. 03/29/2021: 30 day supply 03/15/2021   gabapentin (NEURONTIN) 100  MG capsule Take 100 mg by mouth 2 (two) times daily. 03/29/2021: 03/05/2021 #180 90 day supply   lactulose (CHRONULAC) 10 GM/15ML solution Take 45 mLs (30 g total) by mouth 3 (three) times daily.    metFORMIN (GLUCOPHAGE) 850 MG tablet TAKE 1 TABLET(850 MG) BY MOUTH TWICE DAILY WITH A MEAL 03/05/2021: LF 6.5.22 #180/90   nitroGLYCERIN (NITROSTAT) 0.4 MG SL tablet Place 1 tablet (0.4 mg total) under the tongue every 5 (five) minutes as needed for chest pain. 02/19/2021: 02/19/21: Son/ caregiver reports patient has not needed recently    pantoprazole (PROTONIX) 40 MG tablet Take 1 tablet (40 mg total) by mouth daily.    rifaximin (XIFAXAN) 550 MG TABS tablet Take 1 tablet (550 mg total) by mouth 2 (two) times daily.    spironolactone (ALDACTONE) 50 MG tablet Take 1 tablet (50 mg total) by mouth daily.    umeclidinium-vilanterol (ANORO ELLIPTA) 62.5-25 MCG/INH AEPB Inhale 1 puff into the lungs daily. 03/29/2021: 30 day supply 03/15/2021   No facility-administered encounter medications on file as of 04/19/2021.   Patient Active Problem List   Diagnosis Date Noted   CKD stage 3 due to type 2 diabetes mellitus ([place]) 02/27/2021   Hepatic encephalopathy ([place]) 01/22/2021   GI bleeding 09/10/2020   Hepatocellular carcinoma ([place]) 08/09/2020   Routine general medical examination at a health care facility 05/06/2020   Iron deficiency anemia    Symptomatic anemia 04/15/2020   Pulmonary hypertension, unspecified ([place]) 03/11/2020   Asthma, mild intermittent 40/98/1191   Alcoholic cirrhosis of liver with ascites ([place]) 12/27/2019   (HFpEF) heart failure with  preserved ejection fraction ([place]) 12/24/2018   CKD (chronic kidney disease), stage III ([place]) 12/18/2017   Peptic ulcer disease 12/18/2017   GI bleed 09/29/2017   Class 2 obesity due to excess calories with body mass index (BMI) of 36.0 to 36.9 in adult 02/05/2016   S/P CABG x 3 03/31/2014   Melena 08/14/2012   Hyperlipidemia with target LDL less than 70  06/29/2012   Thrombocytopenia ([place]) 06/29/2012   Coronary artery disease involving native coronary artery of native heart without angina pectoris    OSA (obstructive sleep apnea)    Type 2 diabetes with complication ([place]) 00/86/7619   GERD without esophagitis 08/04/2010   History of gout    Hypertensive heart disease    Conditions to be addressed/monitored:  CHF and anemia  Care Plan : Heart Failure (Adult)  Updates made by Knox Royalty, RN since 04/19/2021 12:00 AM     Problem: Symptom Exacerbation (Heart Failure)   Priority: Medium     Long-Range Goal: Symptom Exacerbation Prevented or Minimized   Start Date: 12/10/2020  Expected End Date: 06/12/2021  Recent Progress: On track  Priority: Medium  Note:   Current Barriers:  Knowledge deficits related to basic heart failure pathophysiology and self care management Unable to independently read, write, understand health conditions and advice- relies on family to manage health needs/ affairs Does not adhere to provider recommendations re: daily weight monitoring/ recording  Financial strain- reports difficulty affording "some medications:" CCM Pharmacy referral placed  Nurse Case Manager Clinical Goal(s): Goal re-established and extended 02/19/21 around ongoing multiple hospital re-admissions (3 inpatient hospitalizations since March 2022) Over the next 12 months, patient/ caregiver will:  Verbalize consistent adherence to daily weight monitoring/ recording Verbalize understanding of Heart Failure Action Plan; when to call care providers vs seek emergent/ urgent care Take all mediations as prescribed/ work with CCM pharmacy team to address states pharmacy needs Interventions:  Collaboration with [name], MD regarding development and update of comprehensive plan of care as evidenced by provider attestation and co-signature Inter-disciplinary care team collaboration (see longitudinal plan of care) Chart reviewed  including relevant office/ hospital notes, recent/ upcoming scheduled appointments, and lab results  04/19/21: Received request from [name], [place] from Dr. Sharlet Salina: apparently, last week, message was sent to Dr. Sharlet Salina from home health PT, ([facility]) stating that patient's home is infested with roaches and bugs- PCP was informed that home health PT would not return to patient's home until infestation corrected.  Per request, [employer] Guide referral placed to contact patient to provide possible environmental resources for patient  From 02/19/21 (last successful outreach) Reviewed recent hospitalizations with patient and caregiver; confirmed no current clinical concerns although patient/ caregiver continue to report bilateral LE swelling, "unchanged" from PCP office visit 02/11/21: encouraged them to notify PCP if swelling becomes worse or if patient develops additional signs/ symptoms of yellow CHF zone Reviewed recent post-hospital discharge office visit with PCP: confirmed patient taking Lasix 40 mg QD; taking Lactulose as prescribed post-hospital discharge: TID Discussed with caregiver and patient importance of taking ALL medications as prescribed; medication review completed and medication list updated in EHR accordingly; made PCP aware that patient is NOT currently taking pantoprazole, for clarification purposes Confirmed patient has been monitoring/ recording daily weights at home: reports consistent ranges between 227-234 lbs, post-hospital discharge; reports weight yesterday of 229 lbs; reports forgot to write down weight today- encouraged patient and caregiver to write down weight each morning Reiterated previously provided education around  basic overview and discussion of pathophysiology of Heart Failure along with importance/ rationale for daily weight monitoring/ recording at home- patient and caregiver will both benefit from ongoing reinforcement of same Reiterated  previously provided education around CHF zones and weight gain guidelines / corresponding action plan and will re-mail previously provided educational material as they report they have lost  Confirmed patient continues trying to follow low salt diet- positive reinforcement provided Reviewed last documented cardiology office visit with patient/ caregiver (from 04/2020): encouraged caregiver to make an appointment with cardiologist, given patient's multiple recent hospitalizations Discussed plans with patient for ongoing care management follow up and provided patient with direct contact information for care management team Self-Care Activities:  Takes Heart Failure Medications as prescribed under direction of family caregivers Inconsistently monitors daily weights at home: reports weighs "a couple of times every week" Caregiver able to verbalize action plan to notify MD of 3 lb weight gain over night or 5 lb in a week- caregiver works and tries to review patient's weights regularly Adheres to low sodium diet  Patient Goals:  Weigh myself daily and write it down on paper so you can easily keep up with weight gain from day to day: we will review your weights at home during each of our phone calls Call office if I gain more than 2 pounds in one day or 5 pounds in one week Be sure to take your fluid pill (Lasix) and the medication for your liver (Lactulose) exactly as prescribed: Take ALL of your medications as prescribed Please read over the attached information about how to manage your heart function at home by monitoring your daily weights and symptoms Continue limiting the salt in your diet Watch for swelling in feet, ankles and legs every day It appears to have been awhile since you have visited with your cardiologist: please schedule an appointment with your cardiologist, given your many recent hospital visits  Follow Up Plan:  Telephone follow up appointment with caregiver/ care management team  member scheduled for: Friday, March 05, 2021 at 1:00 pm The patient has been provided with contact information for the care management team and has been advised to call with any health related questions or concerns.      Plan: Telephone follow up appointment with care management team member scheduled for:  April 28, 2021 The patient has been provided with contact information for the care management team and has been advised to call with any health related questions or concerns  Oneta Rack, RN, BSN, [place] [phone]: direct office [phone]: mobile

## 2021-04-19 NOTE — Telephone Encounter (Signed)
Good afternoon ladies, Dr. Sharlet Salina wanted me to forward you this information.

## 2021-04-20 ENCOUNTER — Telehealth: Payer: Self-pay | Admitting: *Deleted

## 2021-04-20 NOTE — Telephone Encounter (Signed)
   Telephone encounter was:  Unsuccessful.  04/20/2021 Name: [name] MRN: [id number] DOB: [birth year]  Unsuccessful outbound call made today to assist with:   Has a pest problem   Outreach Attempt:  1st Attempt  A HIPAA compliant voice message was left requesting a return call.  Instructed patient to call back at   Instructed patient to call back at [phone]  at their earliest convenience. . [place], Care Management  [phone] [address] , [place] [address] Email : [email or web address]

## 2021-04-21 ENCOUNTER — Telehealth: Payer: Self-pay | Admitting: *Deleted

## 2021-04-21 NOTE — Telephone Encounter (Signed)
   Telephone encounter was:  Unsuccessful.  04/21/2021 Name: [name] MRN: [id number] DOB: Jan 08, 1946  Unsuccessful outbound call made today to assist with:   Has pest problems looking for resources.  Outreach Attempt:  2nd Attempt  A HIPAA compliant voice message was left requesting a return call.  Instructed patient to call back at   Instructed patient to call back at [phone]  at their earliest convenience. .  [place], Care Management  [phone] [address] , [place] [address] Email : [email or web address]

## 2021-04-22 ENCOUNTER — Other Ambulatory Visit: Payer: Self-pay | Admitting: Internal Medicine

## 2021-04-22 ENCOUNTER — Telehealth: Payer: Self-pay | Admitting: *Deleted

## 2021-04-22 NOTE — Telephone Encounter (Signed)
   Telephone encounter was:  Successful.  04/22/2021 Name: [name] MRN: [id number] DOB: [birth year]  [name] is a 75 y.o. year old male who is a primary care patient of Sharlet Salina Real Cons, MD . The community resource team was consulted for assistance with Outreached 3 times. All unsuccessful attempts. Voicemails have been left for patient to return my call.  Care guide performed the following interventions: Follow up call placed to the patient to discuss status of referral.  Follow Up Plan:  No further follow up planned at this time. The patient has been provided with needed resources.  [place], Care Management  [phone] [address] , [place] [address] Email : [email or web address]

## 2021-04-27 ENCOUNTER — Other Ambulatory Visit: Payer: Self-pay | Admitting: Internal Medicine

## 2021-04-28 ENCOUNTER — Emergency Department (HOSPITAL_COMMUNITY): Payer: Medicare HMO

## 2021-04-28 ENCOUNTER — Encounter (HOSPITAL_COMMUNITY): Payer: Self-pay

## 2021-04-28 ENCOUNTER — Ambulatory Visit: Payer: Medicare HMO | Admitting: *Deleted

## 2021-04-28 ENCOUNTER — Inpatient Hospital Stay (HOSPITAL_COMMUNITY)
Admission: EM | Admit: 2021-04-28 | Discharge: 2021-05-05 | DRG: 811 | Disposition: A | Discharge status: Home / Self Care | Payer: Medicare HMO | Attending: Internal Medicine | Admitting: Internal Medicine

## 2021-04-28 ENCOUNTER — Other Ambulatory Visit: Payer: Self-pay

## 2021-04-28 DIAGNOSIS — R9431 Abnormal electrocardiogram [ECG] [EKG]: Secondary | ICD-10-CM | POA: Diagnosis not present

## 2021-04-28 DIAGNOSIS — Z833 Family history of diabetes mellitus: Secondary | ICD-10-CM

## 2021-04-28 DIAGNOSIS — D62 Acute posthemorrhagic anemia: Principal | ICD-10-CM | POA: Diagnosis present

## 2021-04-28 DIAGNOSIS — D649 Anemia, unspecified: Secondary | ICD-10-CM

## 2021-04-28 DIAGNOSIS — Z79899 Other long term (current) drug therapy: Secondary | ICD-10-CM

## 2021-04-28 DIAGNOSIS — E785 Hyperlipidemia, unspecified: Secondary | ICD-10-CM | POA: Diagnosis present

## 2021-04-28 DIAGNOSIS — I509 Heart failure, unspecified: Secondary | ICD-10-CM

## 2021-04-28 DIAGNOSIS — D61818 Other pancytopenia: Secondary | ICD-10-CM | POA: Diagnosis not present

## 2021-04-28 DIAGNOSIS — K7030 Alcoholic cirrhosis of liver without ascites: Secondary | ICD-10-CM | POA: Diagnosis present

## 2021-04-28 DIAGNOSIS — K31811 Angiodysplasia of stomach and duodenum with bleeding: Secondary | ICD-10-CM | POA: Diagnosis not present

## 2021-04-28 DIAGNOSIS — R188 Other ascites: Secondary | ICD-10-CM | POA: Diagnosis not present

## 2021-04-28 DIAGNOSIS — Z887 Allergy status to serum and vaccine status: Secondary | ICD-10-CM

## 2021-04-28 DIAGNOSIS — K633 Ulcer of intestine: Secondary | ICD-10-CM | POA: Diagnosis not present

## 2021-04-28 DIAGNOSIS — G8929 Other chronic pain: Secondary | ICD-10-CM | POA: Diagnosis present

## 2021-04-28 DIAGNOSIS — K219 Gastro-esophageal reflux disease without esophagitis: Secondary | ICD-10-CM | POA: Diagnosis present

## 2021-04-28 DIAGNOSIS — K7460 Unspecified cirrhosis of liver: Secondary | ICD-10-CM | POA: Diagnosis not present

## 2021-04-28 DIAGNOSIS — K922 Gastrointestinal hemorrhage, unspecified: Secondary | ICD-10-CM

## 2021-04-28 DIAGNOSIS — J449 Chronic obstructive pulmonary disease, unspecified: Secondary | ICD-10-CM | POA: Diagnosis present

## 2021-04-28 DIAGNOSIS — Z87820 Personal history of traumatic brain injury: Secondary | ICD-10-CM

## 2021-04-28 DIAGNOSIS — Z8505 Personal history of malignant neoplasm of liver: Secondary | ICD-10-CM

## 2021-04-28 DIAGNOSIS — I864 Gastric varices: Secondary | ICD-10-CM | POA: Diagnosis not present

## 2021-04-28 DIAGNOSIS — I5032 Chronic diastolic (congestive) heart failure: Secondary | ICD-10-CM | POA: Diagnosis not present

## 2021-04-28 DIAGNOSIS — K5730 Diverticulosis of large intestine without perforation or abscess without bleeding: Secondary | ICD-10-CM | POA: Diagnosis present

## 2021-04-28 DIAGNOSIS — D6959 Other secondary thrombocytopenia: Secondary | ICD-10-CM | POA: Diagnosis not present

## 2021-04-28 DIAGNOSIS — K766 Portal hypertension: Secondary | ICD-10-CM | POA: Diagnosis present

## 2021-04-28 DIAGNOSIS — E118 Type 2 diabetes mellitus with unspecified complications: Secondary | ICD-10-CM

## 2021-04-28 DIAGNOSIS — I8510 Secondary esophageal varices without bleeding: Secondary | ICD-10-CM | POA: Diagnosis present

## 2021-04-28 DIAGNOSIS — I2510 Atherosclerotic heart disease of native coronary artery without angina pectoris: Secondary | ICD-10-CM | POA: Diagnosis present

## 2021-04-28 DIAGNOSIS — K2900 Acute gastritis without bleeding: Secondary | ICD-10-CM | POA: Diagnosis present

## 2021-04-28 DIAGNOSIS — Z8249 Family history of ischemic heart disease and other diseases of the circulatory system: Secondary | ICD-10-CM

## 2021-04-28 DIAGNOSIS — R0600 Dyspnea, unspecified: Secondary | ICD-10-CM | POA: Diagnosis not present

## 2021-04-28 DIAGNOSIS — R0602 Shortness of breath: Secondary | ICD-10-CM | POA: Diagnosis not present

## 2021-04-28 DIAGNOSIS — F17210 Nicotine dependence, cigarettes, uncomplicated: Secondary | ICD-10-CM | POA: Diagnosis present

## 2021-04-28 DIAGNOSIS — I700 Atherosclerosis of aorta: Secondary | ICD-10-CM | POA: Diagnosis not present

## 2021-04-28 DIAGNOSIS — Z20822 Contact with and (suspected) exposure to covid-19: Secondary | ICD-10-CM | POA: Diagnosis present

## 2021-04-28 DIAGNOSIS — K5521 Angiodysplasia of colon with hemorrhage: Secondary | ICD-10-CM | POA: Diagnosis not present

## 2021-04-28 DIAGNOSIS — K626 Ulcer of anus and rectum: Secondary | ICD-10-CM | POA: Diagnosis not present

## 2021-04-28 DIAGNOSIS — Z800 Family history of malignant neoplasm of digestive organs: Secondary | ICD-10-CM

## 2021-04-28 DIAGNOSIS — Z7984 Long term (current) use of oral hypoglycemic drugs: Secondary | ICD-10-CM

## 2021-04-28 DIAGNOSIS — Z87442 Personal history of urinary calculi: Secondary | ICD-10-CM

## 2021-04-28 DIAGNOSIS — Z87891 Personal history of nicotine dependence: Secondary | ICD-10-CM

## 2021-04-28 DIAGNOSIS — K529 Noninfective gastroenteritis and colitis, unspecified: Secondary | ICD-10-CM | POA: Diagnosis not present

## 2021-04-28 DIAGNOSIS — I5033 Acute on chronic diastolic (congestive) heart failure: Secondary | ICD-10-CM | POA: Diagnosis present

## 2021-04-28 DIAGNOSIS — K649 Unspecified hemorrhoids: Secondary | ICD-10-CM | POA: Diagnosis not present

## 2021-04-28 DIAGNOSIS — D500 Iron deficiency anemia secondary to blood loss (chronic): Secondary | ICD-10-CM | POA: Diagnosis not present

## 2021-04-28 DIAGNOSIS — K3189 Other diseases of stomach and duodenum: Secondary | ICD-10-CM | POA: Diagnosis not present

## 2021-04-28 DIAGNOSIS — K635 Polyp of colon: Secondary | ICD-10-CM | POA: Diagnosis present

## 2021-04-28 DIAGNOSIS — I110 Hypertensive heart disease with heart failure: Secondary | ICD-10-CM | POA: Diagnosis present

## 2021-04-28 DIAGNOSIS — G4733 Obstructive sleep apnea (adult) (pediatric): Secondary | ICD-10-CM | POA: Diagnosis present

## 2021-04-28 DIAGNOSIS — K5520 Angiodysplasia of colon without hemorrhage: Secondary | ICD-10-CM | POA: Diagnosis not present

## 2021-04-28 DIAGNOSIS — K31819 Angiodysplasia of stomach and duodenum without bleeding: Secondary | ICD-10-CM | POA: Diagnosis not present

## 2021-04-28 DIAGNOSIS — I517 Cardiomegaly: Secondary | ICD-10-CM | POA: Diagnosis not present

## 2021-04-28 DIAGNOSIS — Z801 Family history of malignant neoplasm of trachea, bronchus and lung: Secondary | ICD-10-CM

## 2021-04-28 DIAGNOSIS — J811 Chronic pulmonary edema: Secondary | ICD-10-CM | POA: Diagnosis not present

## 2021-04-28 DIAGNOSIS — K921 Melena: Secondary | ICD-10-CM | POA: Diagnosis not present

## 2021-04-28 LAB — COMPREHENSIVE METABOLIC PANEL
ALT: 21 U/L (ref 0–44)
AST: 29 U/L (ref 15–41)
Albumin: 3.2 g/dL — ABNORMAL LOW (ref 3.5–5.0)
Alkaline Phosphatase: 127 U/L — ABNORMAL HIGH (ref 38–126)
Anion gap: 5 (ref 5–15)
BUN: 14 mg/dL (ref 8–23)
CO2: 20 mmol/L — ABNORMAL LOW (ref 22–32)
Calcium: 8.9 mg/dL (ref 8.9–10.3)
Chloride: 119 mmol/L — ABNORMAL HIGH (ref 98–111)
Creatinine, Ser: 0.93 mg/dL (ref 0.61–1.24)
GFR, Estimated: >60 mL/min (ref >60)
Glucose, Bld: 96 mg/dL (ref 70–99)
Potassium: 4.1 mmol/L (ref 3.5–5.1)
Sodium: 144 mmol/L (ref 135–145)
Total Bilirubin: 1.2 mg/dL (ref 0.3–1.2)
Total Protein: 6.9 g/dL (ref 6.5–8.1)

## 2021-04-28 LAB — PREPARE RBC (CROSSMATCH)

## 2021-04-28 LAB — CBC WITH DIFFERENTIAL/PLATELET
Abs Immature Granulocytes: 0 10*3/uL (ref 0.00–0.07)
Basophils Absolute: 0 10*3/uL (ref 0.0–0.1)
Basophils Relative: 1 %
Eosinophils Absolute: 0.1 10*3/uL (ref 0.0–0.5)
Eosinophils Relative: 5 %
HCT: 20.8 % — ABNORMAL LOW (ref 39.0–52.0)
Hemoglobin: 5.9 g/dL — CL (ref 13.0–17.0)
Immature Granulocytes: 0 %
Lymphocytes Relative: 18 %
Lymphs Abs: 0.4 10*3/uL — ABNORMAL LOW (ref 0.7–4.0)
MCH: 22.2 pg — ABNORMAL LOW (ref 26.0–34.0)
MCHC: 28.4 g/dL — ABNORMAL LOW (ref 30.0–36.0)
MCV: 78.2 fL — ABNORMAL LOW (ref 80.0–100.0)
Monocytes Absolute: 0.3 10*3/uL (ref 0.1–1.0)
Monocytes Relative: 12 %
Neutro Abs: 1.4 10*3/uL — ABNORMAL LOW (ref 1.7–7.7)
Neutrophils Relative %: 64 %
Platelets: 107 10*3/uL — ABNORMAL LOW (ref 150–400)
RBC: 2.66 MIL/uL — ABNORMAL LOW (ref 4.22–5.81)
RDW: 19.9 % — ABNORMAL HIGH (ref 11.5–15.5)
WBC: 2.2 10*3/uL — ABNORMAL LOW (ref 4.0–10.5)
nRBC: 0 % (ref 0.0–0.2)

## 2021-04-28 LAB — URINALYSIS, ROUTINE W REFLEX MICROSCOPIC
Bilirubin Urine: NEGATIVE
Glucose, UA: NEGATIVE mg/dL
Hgb urine dipstick: NEGATIVE
Ketones, ur: NEGATIVE mg/dL
Nitrite: NEGATIVE
Protein, ur: NEGATIVE mg/dL
Specific Gravity, Urine: 1.015 (ref 1.005–1.030)
pH: 6 (ref 5.0–8.0)

## 2021-04-28 LAB — URINALYSIS, MICROSCOPIC (REFLEX)

## 2021-04-28 LAB — BRAIN NATRIURETIC PEPTIDE: B Natriuretic Peptide: 361.3 pg/mL — ABNORMAL HIGH (ref 0.0–100.0)

## 2021-04-28 LAB — AMMONIA: Ammonia: 93 umol/L — ABNORMAL HIGH (ref 9–35)

## 2021-04-28 LAB — POC OCCULT BLOOD, ED: Fecal Occult Bld: POSITIVE — AB

## 2021-04-28 MED ORDER — UMECLIDINIUM-VILANTEROL 62.5-25 MCG/INH IN AEPB
1.0000 | INHALATION_SPRAY | Freq: Every day | RESPIRATORY_TRACT | Status: DC
  Administered 2021-04-29 – 2021-05-05 (×7): 1 via RESPIRATORY_TRACT
  Filled 2021-04-28: qty 14

## 2021-04-28 MED ORDER — INSULIN ASPART 100 UNIT/ML IJ SOLN
0.0000 [IU] | Freq: Four times a day (QID) | INTRAMUSCULAR | Status: DC
  Filled 2021-04-28: qty 0.06

## 2021-04-28 MED ORDER — PANTOPRAZOLE SODIUM 40 MG IV SOLR
40.0000 mg | Freq: Two times a day (BID) | INTRAVENOUS | Status: DC
  Administered 2021-04-28 – 2021-05-05 (×14): 40 mg via INTRAVENOUS
  Filled 2021-04-28 (×14): qty 40

## 2021-04-28 MED ORDER — FUROSEMIDE 10 MG/ML IJ SOLN
20.0000 mg | Freq: Once | INTRAMUSCULAR | Status: AC
  Administered 2021-04-29: 20 mg via INTRAVENOUS
  Filled 2021-04-28: qty 4

## 2021-04-28 MED ORDER — SODIUM CHLORIDE 0.9% IV SOLUTION: Freq: Once | INTRAVENOUS | Status: AC

## 2021-04-28 MED ORDER — FUROSEMIDE 10 MG/ML IJ SOLN: 40.0000 mg | Freq: Two times a day (BID) | INTRAMUSCULAR | Status: DC

## 2021-04-28 MED ORDER — ACETAMINOPHEN 325 MG PO TABS
650.0000 mg | ORAL_TABLET | Freq: Four times a day (QID) | ORAL | Status: DC | PRN
  Administered 2021-05-02: 650 mg via ORAL
  Filled 2021-04-28: qty 2

## 2021-04-28 MED ORDER — FUROSEMIDE 10 MG/ML IJ SOLN
40.0000 mg | Freq: Once | INTRAMUSCULAR | Status: AC
  Administered 2021-04-28: 40 mg via INTRAVENOUS
  Filled 2021-04-28: qty 4

## 2021-04-28 MED ORDER — SODIUM CHLORIDE 0.9 % IV SOLN
2.0000 g | INTRAVENOUS | Status: DC
  Administered 2021-04-28 – 2021-05-04 (×7): 2 g via INTRAVENOUS
  Filled 2021-04-28 (×3): qty 2
  Filled 2021-04-28: qty 20
  Filled 2021-04-28 (×2): qty 2
  Filled 2021-04-28 (×2): qty 20

## 2021-04-28 MED ORDER — ACETAMINOPHEN 650 MG RE SUPP
650.0000 mg | Freq: Four times a day (QID) | RECTAL | Status: DC | PRN
  Filled 2021-04-28: qty 2

## 2021-04-28 NOTE — ED Provider Notes (Signed)
[facility] DEPT Provider Note   CSN: [id number] Arrival date & time: 04/28/21  1804     History Chief Complaint  Patient presents with   Abdominal Distention   Shortness of Breath    [name] is a 75 y.o. male.  Patient arrives in ED complaining of increasing shortness of breath, primarily with exertion, over the the last three days. He states he is out of his lasix. Bilateral lower legs are edematous and brawny, but patient indicates this is at baseline for him. Chills, no documented fever.   The history is provided by the patient. No language interpreter was used.  Shortness of Breath Severity:  Moderate Onset quality:  Gradual Duration:  3 days Timing:  Intermittent Progression:  Worsening Chronicity:  Recurrent Context: activity   Associated symptoms: no abdominal pain, no chest pain, no cough and no fever   Risk factors: obesity       Past Medical History:  Diagnosis Date   Arthritis    "joints tighten up sometimes" (03/27/2104)   Carcinoma of liver, hepatocellular ([place]) 06/30/2020   Chronic lower back pain    Coronary artery disease involving native coronary artery of native heart without angina pectoris    Severe left main disease at catheterization July 2015  CABG x3 with a LIMA to the LAD, SVG to the OM, SVG to the PDA on 03/31/14. EF 60% by cath.    Diastolic heart failure (HCC)    GERD without esophagitis 08/04/2010   Hepatic cirrhosis ([place])    a. Dx 01/2014 - CT a/p    History of blood transfusion    "related to bleeding ulcers"   History of concussion    1976--  NO RESIDUAL   History of GI bleed    a. UGIB 07/2012;  b. 01/2014 admission with GIB/FOB stool req 1U prbc's->EGD showed portal gastropathy, barrett's esoph, and chronic active h. pylori gastritis.   History of gout    2007 &  2008  LEFT LEG-- NO ISSUE SINCE   Hyperlipidemia    Iron deficiency anemia    Kidney stones    OA (osteoarthritis of spine)    LOWER  BACK--  INTERMITTANT LEFT LEG NUMBNESS   OSA (obstructive sleep apnea)    PULMOLOGIST-  DR CLANCE--  MODERATE OSA  STARTED CPAP 2012--  BUT CURRENTLY HAS NOT USED PAST 6 MONTHS   Phimosis    a. s/p circumcision 2015.   Type 2 diabetes mellitus ([place])    Unspecified essential hypertension     Patient Active Problem List   Diagnosis Date Noted   CKD stage 3 due to type 2 diabetes mellitus ([place]) 02/27/2021   Hepatic encephalopathy ([place]) 01/22/2021   GI bleeding 09/10/2020   Hepatocellular carcinoma ([place]) 08/09/2020   Routine general medical examination at a health care facility 05/06/2020   Iron deficiency anemia    Symptomatic anemia 04/15/2020   Pulmonary hypertension, unspecified ([employer]) 03/11/2020   Asthma, mild intermittent 77/41/2878   Alcoholic cirrhosis of liver with ascites ([place]) 12/27/2019   (HFpEF) heart failure with preserved ejection fraction ([place]) 12/24/2018   CKD (chronic kidney disease), stage III ([place]) 12/18/2017   Peptic ulcer disease 12/18/2017   GI bleed 09/29/2017   Class 2 obesity due to excess calories with body mass index (BMI) of 36.0 to 36.9 in adult 02/05/2016   S/P CABG x 3 03/31/2014   Melena 08/14/2012   Hyperlipidemia with target LDL less than 70 06/29/2012   Thrombocytopenia ([place])  06/29/2012   Coronary artery disease involving native coronary artery of native heart without angina pectoris    OSA (obstructive sleep apnea)    Type 2 diabetes with complication ([place]) 48/54/6270   GERD without esophagitis 08/04/2010   History of gout    Hypertensive heart disease     Past Surgical History:  Procedure Laterality Date   ANKLE FRACTURE SURGERY Right 1989   "plate put in"   APPENDECTOMY  05-16-2004   open   BIOPSY  01/08/2021   Procedure: BIOPSY;  Surgeon: Wilford Corner, MD;  Location: [facility];  Service: Endoscopy;;   CIRCUMCISION N/A 09/09/2013   Procedure: CIRCUMCISION ADULT;  Surgeon: Bernestine Amass, MD;  Location: [facility];   Service: Urology;  Laterality: N/A;   COLECTOMY  05-16-2004   COLONOSCOPY WITH PROPOFOL N/A 11/20/2020   Procedure: COLONOSCOPY WITH PROPOFOL;  Surgeon: Ronald Lobo, MD;  Location: [facility];  Service: Endoscopy;  Laterality: N/A;   CORONARY ANGIOPLASTY WITH STENT PLACEMENT  06/28/2012  DR COOPER   PCI W/  X1 DES to [place]. LAD/  LM  40% OSTIAL & 50-60% DISTAL /  50% PROX LCX/  30-40% PROX RCA & 50% MID RCA/   LVEF 65-70%   CORONARY ARTERY BYPASS GRAFT N/A 03/31/2014   Procedure: CORONARY ARTERY BYPASS GRAFTING (CABG) times 3 using left internal mammary artery and right saphenous vein.;  Surgeon: Melrose Nakayama, MD;  Location: [place];  Service: Open Heart Surgery;  Laterality: N/A;   DOBUTAMINE STRESS ECHO  06-08-2012   MODERATE HYPOKINESIS/ ISCHEMIA MID INFERIOR WALL   ESOPHAGOGASTRODUODENOSCOPY  08/15/2012   Procedure: ESOPHAGOGASTRODUODENOSCOPY (EGD);  Surgeon: Wonda Horner, MD;  Location: [facility];  Service: Endoscopy;  Laterality: N/A;   ESOPHAGOGASTRODUODENOSCOPY N/A 02/17/2014   Procedure: ESOPHAGOGASTRODUODENOSCOPY (EGD);  Surgeon: Jeryl Columbia, MD;  Location: [facility] END[facility]COPY;  Service: Endoscopy;  Laterality: N/A;   ESOPHAGOGASTRODUODENOSCOPY N/A 04/17/2020   Procedure: ESOPHAGOGASTRODUODENOSCOPY (EGD);  Surgeon: Ronnette Juniper, MD;  Location: [place];  Service: Gastroenterology;  Laterality: N/A;   ESOPHAGOGASTRODUODENOSCOPY N/A 09/11/2020   Procedure: ESOPHAGOGASTRODUODENOSCOPY (EGD);  Surgeon: Clarene Essex, MD;  Location: [facility];  Service: Endoscopy;  Laterality: N/A;   ESOPHAGOGASTRODUODENOSCOPY  10/09/2020   ESOPHAGOGASTRODUODENOSCOPY N/A 10/09/2020   Procedure: ESOPHAGOGASTRODUODENOSCOPY (EGD);  Surgeon: Otis Brace, MD;  Location: [facility] ENDOSCOPY;  Service: Gastroenterology;  Laterality: N/A;   ESOPHAGOGASTRODUODENOSCOPY N/A 01/08/2021   Procedure: ESOPHAGOGASTRODUODENOSCOPY (EGD);  Surgeon: Wilford Corner, MD;  Location: [facility];  Service: Endoscopy;   Laterality: N/A;   ESOPHAGOGASTRODUODENOSCOPY (EGD) WITH PROPOFOL N/A 09/30/2017   Procedure: ESOPHAGOGASTRODUODENOSCOPY (EGD) WITH PROPOFOL;  Surgeon: Wonda Horner, MD;  Location[facility]COPY;  Service: Endoscopy;  Laterality: N/A;   ESOPHAGOGASTRODUODENOSCOPY (EGD) WITH PROPOFOL N/A 12/20/2017   Procedure: ESOPHAGOGASTRODUODENOSCOPY (EGD) WITH PROPOFOL;  Surgeon: Clarene Essex, MD;  Location: [place];  Service: Endoscopy;  Laterality: N/A;   ESOPHAGOGASTRODUODENOSCOPY (EGD) WITH PROPOFOL N/A 08/10/2020   Procedure: ESOPHAGOGASTRODUODENOSCOPY (EGD) WITH PROPOFOL;  Surgeon: Wilford Corner, MD;  Location: [facility];  Service: Endoscopy;  Laterality: N/A;   ESOPHAGOGASTRODUODENOSCOPY (EGD) WITH PROPOFOL N/A 11/20/2020   Procedure: ESOPHAGOGASTRODUODENOSCOPY (EGD) WITH PROPOFOL;  Surgeon: Ronald Lobo, MD;  Location: [facility];  Service: Endoscopy;  Laterality: N/A;   ESOPHAGOGASTRODUODENOSCOPY (EGD) WITH PROPOFOL N/A 02/28/2021   Procedure: ESOPHAGOGASTRODUODENOSCOPY (EGD) WITH PROPOFOL;  Surgeon: Clarene Essex, MD;  Location: [facility];  Service: Endoscopy;  Laterality: N/A;   GIVENS CAPSULE STUDY N/A 11/20/2020   Procedure: GIVENS CAPSULE STUDY;  Surgeon: Ronald Lobo, MD;  Location:  [facility];  Service: Endoscopy;  Laterality: N/A;   HERNIA REPAIR     INTRAOPERATIVE TRANSESOPHAGEAL ECHOCARDIOGRAM N/A 03/31/2014   Procedure: INTRAOPERATIVE TRANSESOPHAGEAL ECHOCARDIOGRAM;  Surgeon: Melrose Nakayama, MD;  Location: [facility];  Service: Open Heart Surgery;  Laterality: N/A;   IR ANGIOGRAM SELECTIVE EACH ADDITIONAL VESSEL  06/16/2020   IR ANGIOGRAM SELECTIVE EACH ADDITIONAL VESSEL  06/16/2020   IR ANGIOGRAM SELECTIVE EACH ADDITIONAL VESSEL  06/16/2020   IR ANGIOGRAM SELECTIVE EACH ADDITIONAL VESSEL  06/16/2020   IR ANGIOGRAM SELECTIVE EACH ADDITIONAL VESSEL  06/16/2020   IR ANGIOGRAM SELECTIVE EACH ADDITIONAL VESSEL  06/16/2020   IR ANGIOGRAM SELECTIVE EACH ADDITIONAL VESSEL   06/16/2020   IR ANGIOGRAM SELECTIVE EACH ADDITIONAL VESSEL  06/16/2020   IR ANGIOGRAM SELECTIVE EACH ADDITIONAL VESSEL  06/16/2020   IR ANGIOGRAM SELECTIVE EACH ADDITIONAL VESSEL  06/30/2020   IR ANGIOGRAM SELECTIVE EACH ADDITIONAL VESSEL  06/30/2020   IR ANGIOGRAM VISCERAL SELECTIVE  06/16/2020   IR ANGIOGRAM VISCERAL SELECTIVE  06/30/2020   IR EMBO ARTERIAL NOT HEMORR HEMANG INC GUIDE ROADMAPPING  06/16/2020   IR EMBO TUMOR ORGAN ISCHEMIA INFARCT INC GUIDE ROADMAPPING  06/30/2020   IR EMBO VENOUS NOT HEMORR HEMANG  INC GUIDE ROADMAPPING  01/13/2021   IR IVUS EACH ADDITIONAL NON CORONARY VESSEL  01/13/2021   IR RADIOLOGIST EVAL & MGMT  05/06/2020   IR RADIOLOGIST EVAL & MGMT  05/20/2020   IR TIPS  01/13/2021   IR US GUIDE VASC ACCESS RIGHT  06/16/2020   IR US GUIDE VASC ACCESS RIGHT  06/30/2020   IR US GUIDE VASC ACCESS RIGHT  01/13/2021   LAPAROSCOPIC UMBILICAL HERNIA REPAIR W/ MESH  06-06-2011   LEFT HEART CATHETERIZATION WITH CORONARY ANGIOGRAM N/A 03/28/2014   Procedure: LEFT HEART CATHETERIZATION WITH CORONARY ANGIOGRAM;  Surgeon: Sinclair Grooms, MD;  Location: [facility] CATH LAB;  Service: Cardiovascular;  Laterality: N/A;   LIPOMA EXCISION Left 08/25/2017   Procedure: EXCISION LIPOMA LEFT POSTERIOR THIGH;  Surgeon: Clovis Riley, MD;  Location: [place];  Service: General;  Laterality: Left;   NEPHROLITHOTOMY  1990'S   OPEN APPENDECTOMY W/ PARTIAL CECECTOMY  05-16-2004   PERCUTANEOUS CORONARY STENT INTERVENTION (PCI-S) N/A 06/28/2012   Procedure: PERCUTANEOUS CORONARY STENT INTERVENTION (PCI-S);  Surgeon: Sherren Mocha, MD;  Location: [facility] CATH LAB;  Service: Cardiovascular;  Laterality: N/A;   POLYPECTOMY  11/20/2020   Procedure: POLYPECTOMY;  Surgeon: Ronald Lobo, MD;  Location: [facility];  Service: Endoscopy;;   RADIOLOGY WITH ANESTHESIA N/A 01/13/2021   Procedure: IR WITH ANESTHESIA - TIPS;  Surgeon: Suzette Battiest, MD;  Location: [facility];  Service: Radiology;  Laterality: N/A;        Family History  Problem Relation Age of Onset   Lung cancer Sister    Cancer Sister        lung   Cancer Mother    Cancer Father        died in his 39s.   Cancer Brother        lung   Coronary artery disease Other    Diabetes Other    Colon cancer Other    Cancer Sister        lung    Social History   Tobacco Use   Smoking status: Former    Packs/day: 2.00    Years: 40.00    Pack years: 80.00    Types: Cigarettes    Quit date: 08/29/1996    Years since quitting: 24.6   Smokeless tobacco:  Never  Vaping Use   Vaping Use: Never used  Substance Use Topics   Alcohol use: Not Currently    Alcohol/week: 8.0 standard drinks    Types: 8 Cans of beer per week    Comment: 2 beers every other day   Drug use: No    Home Medications Prior to Admission medications   Medication Sig Start Date End Date Taking? Authorizing Provider  Accu-Chek Softclix Lancets lancets USE AS DIRECTED TO TEST BLOOD SUGAR FOUR TIMES DAILY 03/13/20   [name], MD  albuterol (VENTOLIN HFA) 108 (90 Base) MCG/ACT inhaler Inhale 2 puffs into the lungs every 6 (six) hours as needed for wheezing or shortness of breath. 03/01/21   Aline August, MD  blood glucose meter kit and supplies Dispense based on patient and insurance preference. Use up to four times daily as directed. (FOR ICD-10 E10.9, E11.9). 03/12/20   [name], MD  furosemide (LASIX) 40 MG tablet TAKE 1 TABLET(40 MG) BY MOUTH DAILY 04/28/21   [name], MD  gabapentin (NEURONTIN) 100 MG capsule Take 100 mg by mouth 2 (two) times daily. 03/05/21   [provider]  lactulose (CHRONULAC) 10 GM/15ML solution Take 45 mLs (30 g total) by mouth 3 (three) times daily. 03/31/21   Debbe Odea, MD  metFORMIN (GLUCOPHAGE) 850 MG tablet TAKE 1 TABLET(850 MG) BY MOUTH TWICE DAILY WITH A MEAL 01/08/21   [name], MD  nitroGLYCERIN (NITROSTAT) 0.4 MG SL tablet Place 1 tablet (0.4 mg total) under the tongue  every 5 (five) minutes as needed for chest pain. 06/08/16   Nahser, Wonda Cheng, MD  pantoprazole (PROTONIX) 40 MG tablet Take 1 tablet (40 mg total) by mouth daily. 03/31/21   Debbe Odea, MD  rifaximin (XIFAXAN) 550 MG TABS tablet Take 1 tablet (550 mg total) by mouth 2 (two) times daily. 03/31/21   Debbe Odea, MD  spironolactone (ALDACTONE) 50 MG tablet Take 1 tablet (50 mg total) by mouth daily. 03/07/21   Ghimire, Henreitta Leber, MD  umeclidinium-vilanterol (ANORO ELLIPTA) 62.5-25 MCG/INH AEPB Inhale 1 puff into the lungs daily. 03/12/20   [name], MD    Allergies    Fluzone quadrivalent [influenza vac split quad]  Review of Systems   Review of Systems  Constitutional:  Negative for fever.  Respiratory:  Positive for shortness of breath. Negative for cough.   Cardiovascular:  Positive for leg swelling. Negative for chest pain.  Gastrointestinal:  Negative for abdominal pain.  Skin:  Positive for color change.  All other systems reviewed and are negative.  Physical Exam Updated Vital Signs BP 121/62   Pulse 65   Temp 98.4 F (36.9 C) (Oral)   Resp 17   Ht '5\' 8"'  (1.727 m)   Wt 108.9 kg   SpO2 100%   BMI 36.49 kg/m   Physical Exam Constitutional:      Appearance: He is well-developed.  HENT:     Head: Normocephalic.  Eyes:     Pupils: Pupils are equal, round, and reactive to light.  Cardiovascular:     Rate and Rhythm: Normal rate and regular rhythm.  Pulmonary:     Effort: Pulmonary effort is normal.  Chest:     Chest wall: No tenderness.  Abdominal:     Palpations: Abdomen is soft.  Musculoskeletal:     Cervical back: Normal range of motion.     Right lower leg: No edema.     Left lower leg: No edema.  Skin:    General: Skin is warm and dry.  Neurological:     Mental Status: He is alert and oriented to person, place, and time.  Psychiatric:        Mood and Affect: Mood normal.    ED Results / Procedures / Treatments   Labs (all labs ordered are  listed, but only abnormal results are displayed) Labs Reviewed  BRAIN NATRIURETIC PEPTIDE - Abnormal; Notable for the following components:      Result Value   B Natriuretic Peptide 361.3 (*)    All other components within normal limits  COMPREHENSIVE METABOLIC PANEL - Abnormal; Notable for the following components:   Chloride 119 (*)    CO2 20 (*)    Albumin 3.2 (*)    Alkaline Phosphatase 127 (*)    All other components within normal limits  CBC WITH DIFFERENTIAL/PLATELET - Abnormal; Notable for the following components:   WBC 2.2 (*)    RBC 2.66 (*)    Hemoglobin 5.9 (*)    HCT 20.8 (*)    MCV 78.2 (*)    MCH 22.2 (*)    MCHC 28.4 (*)    RDW 19.9 (*)    Platelets 107 (*)    Neutro Abs 1.4 (*)    Lymphs Abs 0.4 (*)    All other components within normal limits  AMMONIA - Abnormal; Notable for the following components:   Ammonia 93 (*)    All other components within normal limits  POC OCCULT BLOOD, ED - Abnormal; Notable for the following components:   Fecal Occult Bld POSITIVE (*)    All other components within normal limits  URINALYSIS, ROUTINE W REFLEX MICROSCOPIC    EKG None  Radiology DG Chest 2 View  Result Date: 04/28/2021 CLINICAL DATA:  Dyspnea upon exertion. EXAM: CHEST - 2 VIEW COMPARISON:  March 28, 2021 FINDINGS: Multiple sternal wires and vascular clips are seen. Moderate severity diffusely increased interstitial lung markings are seen with associated prominence of the pulmonary vasculature. There is no evidence of a pleural effusion or pneumothorax. The cardiac silhouette is mildly enlarged and unchanged in size. There is marked severity calcification of the aortic arch. The visualized skeletal structures are unremarkable. IMPRESSION: Findings consistent with moderate severity congestive heart failure. Electronically Signed   By: Virgina Norfolk M.D.   On: 04/28/2021 19:07    Procedures Procedures   Medications Ordered in ED Medications - No data to  display  ED Course  I have reviewed the triage vital signs and the nursing notes.  Pertinent labs & imaging results that were available during my care of the patient were reviewed by me and considered in my medical decision making (see chart for details).   Hgb 5.9. Not on anticoagulants. Positive for occult blood in stool. Denies hematochezia and melena. Ammonia level elevated at 93. Patient is alert and oriented. BNP 363.1  Patient with exacerbation of congestive heart failure and anemia. He is hemoccult positive. Patient will need diuresis for CHF and likely transfusion.  Discussed with hospitalist ([place])--patient accepted for admission.      MDM Rules/Calculators/A&P                            Final Clinical Impression(s) / ED Diagnoses Final diagnoses:  SOB (shortness of breath)  Symptomatic anemia    Rx / DC Orders ED Discharge Orders     None        Tamala Julian,  Shanon Brow, NP 04/28/21 2149    Lorelle Gibbs, DO 04/28/21 2243

## 2021-04-28 NOTE — H&P (Signed)
History and Physical    PLEASE NOTE THAT DRAGON DICTATION SOFTWARE WAS USED IN THE CONSTRUCTION OF THIS NOTE.   [name] GYB:[id number] DOB: [birth year] DOA: 04/28/2021  PCP: [name], MD Patient coming from: home   I have personally briefly reviewed patient's old medical records in [place]  Chief Complaint: sob  HPI: [name] is a 75 y.o. male with medical history significant for chronic anemia with baseline hemoglobin 7-8.5, recurrent GI bleed, cirrhosis, chronic diastolic heart failure, type 2 diabetes mellitus, COPD, who is admitted to [facility] on 04/28/2021 with symptomatic acute on chronic anemia after presenting from home to [facility] ED complaining of shortness of breath.   The patient reports 3 to 4 days of progressive shortness of breath associated with orthopnea and mild increase in edema involving the bilateral lower extremities.  He denies any associated chest pain, diaphoresis, palpitations, nausea, vomiting, presyncope, or syncope.  Not associate with any recent cough, wheezing, mopped assist, new lower extremity erythema, or calf tenderness.  He also denies any associated subjective fever, chills, rigors, or generalized myalgias.  No recent headache, neck stiffness, rhinitis, rhinorrhea, sore throat, abdominal pain, diarrhea, or rash.  No recent known COVID-19 exposures.  Denies any recent dysuria, gross hematuria, or change in urinary urgency/frequency.   The patient has a documented history of chronic anemia, with chart review revealing baseline hemoglobin of 7-8.5, with most recent prior hemoglobin noted to be 8.5 on 03/29/2021.  This is in the setting of a documented history of cirrhosis as well as recurrent upper gastrointestinal bleeds.  Most recent EGD was performed on 02/28/2021 by Dr. Watt Climes And showed no evidence of active or recent bleed, no evidence of esophageal varices, interval improvement of mild portal hypertensive gastropathy, a small  hiatal hernia, gastric varices without evidence of bleeding, and chronic gastritis in the absence of active bleeding.  Not on any blood thinners as an outpatient, including no aspirin.  Not on any NSAIDs.  No recent trauma.  The patient denies any recent hematemesis, melena, or hematochezia.  No recent change from bowel habits.  Additionally, he has a history of pancytopenia, with most recent prior white blood cell count noted to be 2.3 on 03/28/2021 well post recent prior platelet count was found to be 106 on that date.  Per chart review, the patient also has a history of chronic diastolic heart failure, with most recent echocardiogram in May 2022 notable for LVEF 60 to 65%, no focal wall motion abnormalities, moderate concentric LVH, and grade 3 diastolic dysfunction.  His outpatient diuretic regimen consists of Lasix 40 mg p.o. daily as well as spironolactone 50 mg p.o. daily.  The patient acknowledges recent suboptimal compliance with his home Lasix, conveying that he has missed his daily dose of this medication each of the last 3 days.     ED Course:  Vital signs in the ED were notable for the following:  -Heart rate 64-70; blood pressure 121/56 132/63; respiratory rate 15-20, oxygen saturation 100% on room air.  Labs were notable for the following: CMP notable for the following: Sodium 144, BUN 14 compared to most recent prior value of 11 on 03/30/2021, creatinine 0.93 compared to most recent prior value of 1.03 on 03/30/2021.  BNP 360 compared to 432 on 03/04/2021.  CBC notable for white blood cell count 2,200, hemoglobin 5.9, platelet count 107.  Rectal exam performed by EDP demonstrated no evidence of gross blood, but was associated with fecal occult  blood positive finding.  Urinalysis notable for 11-20 white blood cells, rare bacteria, nitrate negative.  Screening COVID-19 PCR was performed in the ED today, with results currently pending.  Imaging and additional notable ED work-up: In comparison to  chest x-ray on 03/28/2021, this evening's chest x-ray showed interval increase in pulmonary vascular congestion as well as increase in interstitial opacities consistent with interval development of CHF in the absence of overt infiltrate, pleural effusion, or pneumothorax.  While in the ED, the following were administered: Lasix 40 mg IV x1.     Review of Systems: As per HPI otherwise 10 point review of systems negative.   Past Medical History:  Diagnosis Date   Arthritis    "joints tighten up sometimes" (03/27/2104)   Carcinoma of liver, hepatocellular ([place]) 06/30/2020   Chronic lower back pain    Coronary artery disease involving native coronary artery of native heart without angina pectoris    Severe left main disease at catheterization July 2015  CABG x3 with a LIMA to the LAD, SVG to the OM, SVG to the PDA on 03/31/14. EF 60% by cath.    Diastolic heart failure (HCC)    GERD without esophagitis 08/04/2010   Hepatic cirrhosis ([place])    a. Dx 01/2014 - CT a/p    History of blood transfusion    "related to bleeding ulcers"   History of concussion    1976--  NO RESIDUAL   History of GI bleed    a. UGIB 07/2012;  b. 01/2014 admission with GIB/FOB stool req 1U prbc's->EGD showed portal gastropathy, barrett's esoph, and chronic active h. pylori gastritis.   History of gout    2007 &  2008  LEFT LEG-- NO ISSUE SINCE   Hyperlipidemia    Iron deficiency anemia    Kidney stones    OA (osteoarthritis of spine)    LOWER BACK--  INTERMITTANT LEFT LEG NUMBNESS   OSA (obstructive sleep apnea)    PULMOLOGIST-  DR CLANCE--  MODERATE OSA  STARTED CPAP 2012--  BUT CURRENTLY HAS NOT USED PAST 6 MONTHS   Phimosis    a. s/p circumcision 2015.   Type 2 diabetes mellitus ([place])    Unspecified essential hypertension     Past Surgical History:  Procedure Laterality Date   ANKLE FRACTURE SURGERY Right 1989   "plate put in"   APPENDECTOMY  05-16-2004   open   BIOPSY  01/08/2021   Procedure: BIOPSY;   Surgeon: Wilford Corner, MD;  Location: [facility];  Service: Endoscopy;;   CIRCUMCISION N/A 09/09/2013   Procedure: CIRCUMCISION ADULT;  Surgeon: Bernestine Amass, MD;  Location[facility];  Service: Urology;  Laterality: N/A;   COLECTOMY  05-16-2004   COLONOSCOPY WITH PROPOFOL N/A 11/20/2020   Procedure: COLONOSCOPY WITH PROPOFOL;  Surgeon: Ronald Lobo, MD;  Location: [facility];  Service: Endoscopy;  Laterality: N/A;   CORONARY ANGIOPLASTY WITH STENT PLACEMENT  06/28/2012  DR COOPER   PCI W/  X1 DES to [place]. LAD/  LM  40% OSTIAL & 50-60% DISTAL /  50% PROX LCX/  30-40% PROX RCA & 50% MID RCA/   LVEF 65-70%   CORONARY ARTERY BYPASS GRAFT N/A 03/31/2014   Procedure: CORONARY ARTERY BYPASS GRAFTING (CABG) times 3 using left internal mammary artery and right saphenous vein.;  Surgeon: Melrose Nakayama, MD;  Location: [place];  Service: Open Heart Surgery;  Laterality: N/A;   DOBUTAMINE STRESS ECHO  06-08-2012   MODERATE HYPOKINESIS/ ISCHEMIA MID INFERIOR  WALL   ESOPHAGOGASTRODUODENOSCOPY  08/15/2012   Procedure: ESOPHAGOGASTRODUODENOSCOPY (EGD);  Surgeon: Wonda Horner, MD;  Location: [facility] ENDOSCOPY;  Service: Endoscopy;  Laterality: N/A;   ESOPHAGOGASTRODUODENOSCOPY N/A 02/17/2014   Procedure: ESOPHAGOGASTRODUODENOSCOPY (EGD);  Surgeon: Jeryl Columbia, MD;  Location: [facility] ENDOSCOPY;  Service: Endoscopy;  Laterality: N/A;   ESOPHAGOGASTRODUODENOSCOPY N/A 04/17/2020   Procedure: ESOPHAGOGASTRODUODENOSCOPY (EGD);  Surgeon: Ronnette Juniper, MD;  Location: [place];  Service: Gastroenterology;  Laterality: N/A;   ESOPHAGOGASTRODUODENOSCOPY N/A 09/11/2020   Procedure: ESOPHAGOGASTRODUODENOSCOPY (EGD);  Surgeon: Clarene Essex, MD;  Location: [facility];  Service: Endoscopy;  Laterality: N/A;   ESOPHAGOGASTRODUODENOSCOPY  10/09/2020   ESOPHAGOGASTRODUODENOSCOPY N/A 10/09/2020   Procedure: ESOPHAGOGASTRODUODENOSCOPY (EGD);  Surgeon: Otis Brace, MD;  Location: [facility] ENDOSCOPY;  Service:  Gastroenterology;  Laterality: N/A;   ESOPHAGOGASTRODUODENOSCOPY N/A 01/08/2021   Procedure: ESOPHAGOGASTRODUODENOSCOPY (EGD);  Surgeon: Wilford Corner, MD;  Location: [facility];  Service: Endoscopy;  Laterality: N/A;   ESOPHAGOGASTRODUODENOSCOPY (EGD) WITH PROPOFOL N/A 09/30/2017   Procedure: ESOPHAGOGASTRODUODENOSCOPY (EGD) WITH PROPOFOL;  Surgeon: Wonda Horner, MD;  Location: [facility] ENDOSCOPY;  Service: Endoscopy;  Laterality: N/A;   ESOPHAGOGASTRODUODENOSCOPY (EGD) WITH PROPOFOL N/A 12/20/2017   Procedure: ESOPHAGOGASTRODUODENOSCOPY (EGD) WITH PROPOFOL;  Surgeon: Clarene Essex, MD;  Location: [place];  Service: Endoscopy;  Laterality: N/A;   ESOPHAGOGASTRODUODENOSCOPY (EGD) WITH PROPOFOL N/A 08/10/2020   Procedure: ESOPHAGOGASTRODUODENOSCOPY (EGD) WITH PROPOFOL;  Surgeon: Wilford Corner, MD;  Location: [place];  Service: Endoscopy;  Laterality: N/A;   ESOPHAGOGASTRODUODENOSCOPY (EGD) WITH PROPOFOL N/A 11/20/2020   Procedure: ESOPHAGOGASTRODUODENOSCOPY (EGD) WITH PROPOFOL;  Surgeon: Ronald Lobo, MD;  Location: [facility];  Service: Endoscopy;  Laterality: N/A;   ESOPHAGOGASTRODUODENOSCOPY (EGD) WITH PROPOFOL N/A 02/28/2021   Procedure: ESOPHAGOGASTRODUODENOSCOPY (EGD) WITH PROPOFOL;  Surgeon: Clarene Essex, MD;  Location: [facility];  Service: Endoscopy;  Laterality: N/A;   GIVENS CAPSULE STUDY N/A 11/20/2020   Procedure: GIVENS CAPSULE STUDY;  Surgeon: Ronald Lobo, MD;  Location: [facility];  Service: Endoscopy;  Laterality: N/A;   HERNIA REPAIR     INTRAOPERATIVE TRANSESOPHAGEAL ECHOCARDIOGRAM N/A 03/31/2014   Procedure: INTRAOPERATIVE TRANSESOPHAGEAL ECHOCARDIOGRAM;  Surgeon: Melrose Nakayama, MD;  Location: [place];  Service: Open Heart Surgery;  Laterality: N/A;   IR ANGIOGRAM SELECTIVE EACH ADDITIONAL VESSEL  06/16/2020   IR ANGIOGRAM SELECTIVE EACH ADDITIONAL VESSEL  06/16/2020   IR ANGIOGRAM SELECTIVE EACH ADDITIONAL VESSEL  06/16/2020   IR ANGIOGRAM SELECTIVE EACH  ADDITIONAL VESSEL  06/16/2020   IR ANGIOGRAM SELECTIVE EACH ADDITIONAL VESSEL  06/16/2020   IR ANGIOGRAM SELECTIVE EACH ADDITIONAL VESSEL  06/16/2020   IR ANGIOGRAM SELECTIVE EACH ADDITIONAL VESSEL  06/16/2020   IR ANGIOGRAM SELECTIVE EACH ADDITIONAL VESSEL  06/16/2020   IR ANGIOGRAM SELECTIVE EACH ADDITIONAL VESSEL  06/16/2020   IR ANGIOGRAM SELECTIVE EACH ADDITIONAL VESSEL  06/30/2020   IR ANGIOGRAM SELECTIVE EACH ADDITIONAL VESSEL  06/30/2020   IR ANGIOGRAM VISCERAL SELECTIVE  06/16/2020   IR ANGIOGRAM VISCERAL SELECTIVE  06/30/2020   IR EMBO ARTERIAL NOT HEMORR HEMANG INC GUIDE ROADMAPPING  06/16/2020   IR EMBO TUMOR ORGAN ISCHEMIA INFARCT INC GUIDE ROADMAPPING  06/30/2020   IR EMBO VENOUS NOT HEMORR HEMANG  INC GUIDE ROADMAPPING  01/13/2021   IR IVUS EACH ADDITIONAL NON CORONARY VESSEL  01/13/2021   IR RADIOLOGIST EVAL & MGMT  05/06/2020   IR RADIOLOGIST EVAL & MGMT  05/20/2020   IR TIPS  01/13/2021   IR US GUIDE VASC ACCESS RIGHT  06/16/2020   IR US GUIDE VASC ACCESS RIGHT  06/30/2020   IR  US GUIDE VASC ACCESS RIGHT  01/13/2021   LAPAROSCOPIC UMBILICAL HERNIA REPAIR W/ MESH  06-06-2011   LEFT HEART CATHETERIZATION WITH CORONARY ANGIOGRAM N/A 03/28/2014   Procedure: LEFT HEART CATHETERIZATION WITH CORONARY ANGIOGRAM;  Surgeon: Sinclair Grooms, MD;  Location[facility] C[facility] LAB;  Service: Cardiovascular;  Laterality: N/A;   LIPOMA EXCISION Left 08/25/2017   Procedure: EXCISION LIPOMA LEFT POSTERIOR THIGH;  Surgeon: Clovis Riley, MD;  Location: [place];  Service: General;  Laterality: Left;   NEPHROLITHOTOMY  1990'S   OPEN APPENDECTOMY W/ PARTIAL CECECTOMY  05-16-2004   PERCUTANEOUS CORONARY STENT INTERVENTION (PCI-S) N/A 06/28/2012   Procedure: PERCUTANEOUS CORONARY STENT INTERVENTION (PCI-S);  Surgeon: Sherren Mocha, MD;  Location: [facility] CATH LAB;  Service: Cardiovascular;  Laterality: N/A;   POLYPECTOMY  11/20/2020   Procedure: POLYPECTOMY;  Surgeon: Ronald Lobo, MD;  Location[facility];   Service: Endoscopy;;   RADIOLOGY WITH ANESTHESIA N/A 01/13/2021   Procedure: IR WITH ANESTHESIA - TIPS;  Surgeon: Suzette Battiest, MD;  Location: [place];  Service: Radiology;  Laterality: N/A;    Social History:  reports that he quit smoking about 24 years ago. His smoking use included cigarettes. He has a 80.00 pack-year smoking history. He has never used smokeless tobacco. He reports that he does not currently use alcohol after a past usage of about 8.0 standard drinks per week. He reports that he does not use drugs.   Allergies  Allergen Reactions   Fluzone Quadrivalent [Influenza Vac Split Quad] Other (See Comments)    The patient stated, in 10/2020: "I am not taking any more flu shots. It liked to have killed me."    Family History  Problem Relation Age of Onset   Lung cancer Sister    Cancer Sister        lung   Cancer Mother    Cancer Father        died in his 68s.   Cancer Brother        lung   Coronary artery disease Other    Diabetes Other    Colon cancer Other    Cancer Sister        lung    Family history reviewed and not pertinent    Prior to Admission medications   Medication Sig Start Date End Date Taking? Authorizing Provider  Accu-Chek Softclix Lancets lancets USE AS DIRECTED TO TEST BLOOD SUGAR FOUR TIMES DAILY 03/13/20   [name], MD  albuterol (VENTOLIN HFA) 108 (90 Base) MCG/ACT inhaler Inhale 2 puffs into the lungs every 6 (six) hours as needed for wheezing or shortness of breath. 03/01/21   Aline August, MD  blood glucose meter kit and supplies Dispense based on patient and insurance preference. Use up to four times daily as directed. (FOR ICD-10 E10.9, E11.9). 03/12/20   [name], MD  furosemide (LASIX) 40 MG tablet TAKE 1 TABLET(40 MG) BY MOUTH DAILY 04/28/21   [name], MD  gabapentin (NEURONTIN) 100 MG capsule Take 100 mg by mouth 2 (two) times daily. 03/05/21   [provider]  lactulose (CHRONULAC) 10  GM/15ML solution Take 45 mLs (30 g total) by mouth 3 (three) times daily. 03/31/21   Debbe Odea, MD  metFORMIN (GLUCOPHAGE) 850 MG tablet TAKE 1 TABLET(850 MG) BY MOUTH TWICE DAILY WITH A MEAL 01/08/21   [name], MD  nitroGLYCERIN (NITROSTAT) 0.4 MG SL tablet Place 1 tablet (0.4 mg total) under the tongue every 5 (five) minutes  as needed for chest pain. 06/08/16   Nahser, Wonda Cheng, MD  pantoprazole (PROTONIX) 40 MG tablet Take 1 tablet (40 mg total) by mouth daily. 03/31/21   Debbe Odea, MD  rifaximin (XIFAXAN) 550 MG TABS tablet Take 1 tablet (550 mg total) by mouth 2 (two) times daily. 03/31/21   Debbe Odea, MD  spironolactone (ALDACTONE) 50 MG tablet Take 1 tablet (50 mg total) by mouth daily. 03/07/21   Ghimire, Henreitta Leber, MD  umeclidinium-vilanterol (ANORO ELLIPTA) 62.5-25 MCG/INH AEPB Inhale 1 puff into the lungs daily. 03/12/20   [name], MD     Objective    Physical Exam: Vitals:   04/28/21 1930 04/28/21 2000 04/28/21 2030 04/28/21 2100  BP: 121/62 133/61 132/63 123/66  Pulse: 65 70 65 64  Resp: '17 16 16 15  ' Temp:      TempSrc:      SpO2: 100% 100% 100% 100%  Weight:      Height:        General: appears to be stated age; alert, oriented Skin: warm, dry, no rash Head:  AT/[place] Mouth:  Oral mucosa membranes appear moist, normal dentition Neck: supple; trachea midline Heart:  RRR; did not appreciate any M/R/G Lungs: bibasilar crackles noted, but otherwise CTAB, did not appreciate any wheezes, or rhonchi Abdomen: + BS; soft, ND, NT Vascular: 2+ pedal pulses b/l; 2+ radial pulses b/l Extremities: 1-2+ edema in B/L LE's; no muscle wasting Neuro: strength and sensation intact in upper and lower extremities b/l    Labs on Admission: I have personally reviewed following labs and imaging studies  CBC: Recent Labs  Lab 04/28/21 1843  WBC 2.2*  NEUTROABS 1.4*  HGB 5.9*  HCT 20.8*  MCV 78.2*  PLT 342*   Basic Metabolic Panel: Recent Labs   Lab 04/28/21 1843  NA 144  K 4.1  CL 119*  CO2 20*  GLUCOSE 96  BUN 14  CREATININE 0.93  CALCIUM 8.9   GFR: Estimated Creatinine Clearance: 82.1 mL/min (by C-G formula based on SCr of 0.93 mg/dL). Liver Function Tests: Recent Labs  Lab 04/28/21 1843  AST 29  ALT 21  ALKPHOS 127*  BILITOT 1.2  PROT 6.9  ALBUMIN 3.2*   No results for input(s): LIPASE, AMYLASE in the last 168 hours. Recent Labs  Lab 04/28/21 1843  AMMONIA 93*   Coagulation Profile: No results for input(s): INR, PROTIME in the last 168 hours. Cardiac Enzymes: No results for input(s): CKTOTAL, CKMB, CKMBINDEX, TROPONINI in the last 168 hours. BNP (last 3 results) No results for input(s): PROBNP in the last 8760 hours. HbA1C: No results for input(s): HGBA1C in the last 72 hours. CBG: No results for input(s): GLUCAP in the last 168 hours. Lipid Profile: No results for input(s): CHOL, HDL, LDLCALC, TRIG, CHOLHDL, LDLDIRECT in the last 72 hours. Thyroid Function Tests: No results for input(s): TSH, T4TOTAL, FREET4, T3FREE, THYROIDAB in the last 72 hours. Anemia Panel: No results for input(s): VITAMINB12, FOLATE, FERRITIN, TIBC, IRON, RETICCTPCT in the last 72 hours. Urine analysis:    Component Value Date/Time   COLORURINE YELLOW 04/28/2021 2150   APPEARANCEUR CLEAR 04/28/2021 2150   LABSPEC 1.015 04/28/2021 2150   PHURINE 6.0 04/28/2021 2150   GLUCOSEU NEGATIVE 04/28/2021 2150   HGBUR NEGATIVE 04/28/2021 2150   BILIRUBINUR NEGATIVE 04/28/2021 2150   KETONESUR NEGATIVE 04/28/2021 2150   PROTEINUR NEGATIVE 04/28/2021 2150   UROBILINOGEN 0.2 03/29/2014 1546   NITRITE NEGATIVE 04/28/2021 2150   LEUKOCYTESUR SMALL (A) 04/28/2021 2150  Radiological Exams on Admission: DG Chest 2 View  Result Date: 04/28/2021 CLINICAL DATA:  Dyspnea upon exertion. EXAM: CHEST - 2 VIEW COMPARISON:  March 28, 2021 FINDINGS: Multiple sternal wires and vascular clips are seen. Moderate severity diffusely increased  interstitial lung markings are seen with associated prominence of the pulmonary vasculature. There is no evidence of a pleural effusion or pneumothorax. The cardiac silhouette is mildly enlarged and unchanged in size. There is marked severity calcification of the aortic arch. The visualized skeletal structures are unremarkable. IMPRESSION: Findings consistent with moderate severity congestive heart failure. Electronically Signed   By: Virgina Norfolk M.D.   On: 04/28/2021 19:07      Assessment/Plan   [name] is a 75 y.o. male with medical history significant for chronic anemia with baseline hemoglobin 7-8.5, recurrent GI bleed, cirrhosis, chronic diastolic heart failure, type 2 diabetes mellitus, COPD, who is admitted to [facility] on 04/28/2021 with symptomatic acute on chronic anemia after presenting from home to [facility] ED complaining of shortness of breath.    Principal Problem:   Acute on chronic anemia Active Problems:   Type 2 diabetes with complication (HCC)   SOB (shortness of breath)   Acute on chronic diastolic CHF (congestive heart failure) (HCC)   Acute upper GI bleed   Cirrhosis ([place])      #) Symptomatic acute on chronic anemia: in the setting of a history of multifactorial chronic anemia with baseline hemoglobin 7-8.5, and associated contributions from chronic pancytopenia as a consequence of cirrhosis in addition to recurrent upper gastrointestinal bleed in the setting of chronic gastritis, presenting hemoglobin noted to be 5.9 with interval decline relative to baseline felt to be symptomatic in nature in the context of recent development of shortness of breath as well as felt to be contributory to presenting acute on chronic diastolic heart failure, as further detailed below.  In terms of etiologies leading to presenting acute on chronic anemia, suspect contribution from recurrence of slow upper gastrointestinal bleed, as further detailed below.  Aside from the  patient's presenting shortness of breath, he is otherwise asymptomatic, including denial of any associated chest pain.  Additionally, he appears hemodynamically stable, with nontachycardic and normotensive blood pressures throughout his ED course, further suggesting that his presenting hemoglobin of 5.9 is a consequence of a slow gastrointestinal bleed and that this absolute value is not far from his baseline hemoglobin range.  Given concomitant presenting acute on chronic diastolic heart failure, with hemodynamic stability at the current juncture, we will slowly transfuse 2 units PRBC, with plan for IV Lasix after completion of each of these 2 units.     Plan: work-up and management for presenting suspected acute upper GI bleed, as below, including close monitoring of Q4H H&H's.  Transfusion of 2 units PRBC, with each unit to be transfused slowly over 3 hours.  Lasix 20 mg IV to be given after completion of each unit PRBC.  Monitor on telemetry.  Monitor continuous pulse oximetry.  Clear liquids.  Refraining from pharmacologic DVT prophylaxis.  Check INR in the morning. Add on the following to initial lab specimen collected in the ED today: total iron, TIBC, ferritin, MMA, folic acid level, reticulocyte count.        #) Acute Upper GI Bleed: In the setting of a documented history of recurrent upper gastrointestinal bleeds in the setting of chronic gastritis, with most recent EGD performed on 02/28/2021, with results as detailed above, suspect presenting recurrence of slow upper GI  bleed on the basis of acute on chronic anemia with fecal occult blood positive finding, as well as interval increase in BUN, as quantified above. Not on any blood thinners as an outpatient, including no aspirin. Denies NSAID use.  Has a history of cirrhosis, and consequently, will initiate SBP prophylaxis, as further detailed below.  Most recent prior EGD as well as clinical nature of current presentation are not suggestive of  acute esophageal variceal bleed, and therefore will refrain from initiation of octreotide at this time.  At this time, the patient appears hemodynamically stable, with normotensive blood pressures in the absence of any associated tachycardia. Presentation appears to be associated with acute on chronic anemia, as further described below.  Presentation not associate with any hematemesis.  Given suspected slow upper GI source, will initiate IV Protonix, as further described below, with consideration for discussing case with GI in the AM.     Plan: NPO. Refraining from pharmacologic DVT prophylaxis. Monitor on telemetry. Monitor continuous pulse-ox. Maintain at least 2 large bore IV's. Check INR in the AM. Q4H H&H's have been ordered through 9 AM on 04/29/21 .  Slow transfusion of 2 units PRBC, as further detailed above.  Protonix 40 mg IV twice daily.  Repeat CBC in the morning.  Add on iron studies to pretransfusion specimen.  Rocephin.  Repeat CMP in the morning.     #) Acute on chronic diastolic heart failure: dx of acute decompensation on the basis of presenting 3 to 4 days of progressive shortness of breath associated with orthopnea, worsening peripheral edema, and chest x-ray showing interval worsening pulmonary vascular congestion as well as interval increase in interstitial opacities consistent with CHF. This is in the context of a known history of chronic diastolic heart failure, with most recent echocardiogram performed in May 2022, as further detailed above. Etiology leading to presenting acutely decompensated heart failure is suspected to stem, at least in part, from acute on chronic anemia, as further detailed above, in addition to contribution from patient's acknowledgment of suboptimal compliance on his home Lasix regimen, as above.  ACS leading to presenting acutely, state heart failure is felt to be less likely at this time, particular in the absence of any recent chest pain, and more likely  and temporally relevant contribution from acute on chronic anemia. However, will check ekg at this time. Of note, I utilized the Heart Failure order set to assist with my placement of orders on this patient.    Plan: monitor strict I's & O's and daily weights. Monitor on telemetry, including trend in HR in response to diuresis.  Lasix 20 mg IV after completion of transfusion each of 2 units PRBC, followed by Lasix 40 mg IV twice daily.  Hold home oral Lasix and spironolactone for now.  Monitor continuous pulse oximetry. Repeat BMP in the morning, including for monitoring trend of potassium, bicarbonate, and renal function in response to interval diuresis efforts. Check serum magnesium level.  Check EKG.  Further evaluation management of acute on chronic anemia in the setting of acute upper GI bleed, as above.       #) Asymptomatic pyuria: Presenting urinalysis shows 11-20 white blood cells, with rare bacteria and nitrate negative finding.  In the setting of the patient's denial of any acute urinary symptoms, including no recent dysuria, this clinical presentation and associated lab findings appear most consistent with asymptomatic pyuria.  Consequently, will refrain from initiation of antibiotics for uti at this time.   Plan: Repeat CBC  in the morning.      #) Type 2 diabetes mellitus: Not on insulin dependent as an outpatient.  On metformin as a sole outpatient oral hypoglycemic agent.  Presenting blood sugar noted to be 96.  Plan: In the setting of clear liquid diet at this time, will monitor Accu-Cheks every 6 hours with low-dose sliding scale insulin.  Hold home metformin for now.      #) Cirrhosis: Documented history of such, with chart review revealing documentation of suspected alcoholic source.  Patient conveys that he does not consume more than 112 ounce beer on a 24-hour basis, on average.  In the setting of associated portal hypertension, he is on Lasix as well as spironolactone at  home.  Additionally, his cirrhosis appears complicated by history of hepatic encephalopathy for which she is on lactulose and right rifaximin as an outpatient.  In the absence of a current INR value, unable to calculate meld score at the present time.  In the setting of plan for IV diuresis in the setting of acute on chronic diastolic heart failure, will hold home oral Lasix and spironolactone for now.  Of note, patient cirrhosis appears complicated by chronic pancytopenia, as further quantified above.  Plan: Monitor strict I's and O's and daily weights.  Add on serum magnesium level.  Holding home oral diuretic medications for now in lieu of plan for IV diuresis, as above.  Repeat CMP in the morning.  Add on INR level in plan to recheck INR in the morning.  SBP prophylaxis in the setting of suspected presenting acute upper gastrointestinal bleed, as above.      #) COPD: Documented history of such in the setting of being a former smoker however the patient confirming that he no longer smokes.  Outpatient respiratory regimen includes scheduled Anoro Ellipta as well as as needed albuterol inhaler.  While the patient presents with 3 to 4 days of progressive shortness of breath, clinically, presentation is not particularly suggestive of acute COPD exacerbation at this time, although will closely monitor for ensuing evidence of interval development of such.  Plan: Continue home Anora Ellipta as well as as needed albuterol inhaler.  Monitor continuous pulse oximetry.  Check serum phosphorus level.  Check VBG.    DVT prophylaxis: scd's  Code Status: Full code Family Communication: none Disposition Plan: Per Rounding Team Consults called: none;  Admission status: inpatient; pcu     Of note, this patient was added by me to the following Admit List/Treatment Team: [email or web address].      PLEASE NOTE THAT DRAGON DICTATION SOFTWARE WAS USED IN THE CONSTRUCTION OF THIS NOTE.   N[facility] Pager [phone] From [place]  Otherwise, please contact night-coverage  [email or web address] Password [facility]   04/28/2021, 10:49 PM

## 2021-04-28 NOTE — ED Provider Notes (Signed)
Emergency Medicine Provider Triage Evaluation Note  [name] , a 75 y.o. male  was evaluated in triage.  Pt complains of fluid overload.  Reports he has been out of his Lasix for the past 3 days.  Past medical history congestive heart failure.  Review of Systems  Positive: LLE edema, SOB, Negative: Dizziness, CP  Physical Exam  BP (!) 121/56 (BP Location: Left Arm)   Pulse 66   Temp 98.4 F (36.9 C) (Oral)   Resp 20   SpO2 100%  Gen:   Awake, ill-appearing, some distress Resp:  Normal effort, wheezes and rales in all lung fields MSK:   Moves extremities without difficulty  Other:  Appears jaundiced.  Medical Decision Making  Medically screening exam initiated at 6:23 PM.  Appropriate orders placed.  [name] was informed that the remainder of the evaluation will be completed by another provider, this initial triage assessment does not replace that evaluation, and the importance of remaining in the ED until their evaluation is complete.    Darliss Ridgel 04/28/21 Trecia Rogers, MD 04/28/21 2350

## 2021-04-28 NOTE — Chronic Care Management (AMB) (Signed)
  Chronic Care Management   Follow Up Note   04/28/2021 Name: CURVIN HUNGER MRN: [id number] DOB: Mar 03, 1946   Referred by: [name], MD Reason for referral : Chronic Care Management (CCM RN CM M- Case Closure- unable to maintain contact)  Third unsuccessful telephone outreach was attempted today. The patient was referred to the case management team for assistance with care management and care coordination. The patient's primary care provider has been notified of our unsuccessful attempts to make or maintain contact with the patient. The care management team is pleased to engage with this patient at any time in the future should he/she be interested in assistance from the care management team.   Case Closure: CCM team have been unable to maintain contact with patient nor caregiver and have placed 3-plus unsuccessful outreaches without patient/ caregiver call-back  Follow Up Plan: We have been unable to make contact with the patient for follow up. The care management team is available to follow up with the patient after provider conversation with the patient regarding recommendation for care management engagement and subsequent re-referral to the care management team.   Oneta Rack, RN, BSN, [place] [phone]: direct office [phone]: mobile

## 2021-04-28 NOTE — ED Triage Notes (Signed)
Pt arrives c/o "needing fluid taken off". States he has chronic hx of cirrhosis with multiple paracentesis. Pt states he has been without his "fluid pill" for three days. Pt endorses DOE. No CP, N/V.

## 2021-04-29 ENCOUNTER — Inpatient Hospital Stay (HOSPITAL_COMMUNITY): Payer: Medicare HMO

## 2021-04-29 DIAGNOSIS — K7460 Unspecified cirrhosis of liver: Secondary | ICD-10-CM | POA: Diagnosis present

## 2021-04-29 DIAGNOSIS — K922 Gastrointestinal hemorrhage, unspecified: Secondary | ICD-10-CM | POA: Diagnosis present

## 2021-04-29 LAB — RETICULOCYTES
Immature Retic Fract: 18.5 % — ABNORMAL HIGH (ref 2.3–15.9)
RBC.: 3.03 MIL/uL — ABNORMAL LOW (ref 4.22–5.81)
Retic Count, Absolute: 27.6 10*3/uL (ref 19.0–186.0)
Retic Ct Pct: 0.9 % (ref 0.4–3.1)

## 2021-04-29 LAB — CBC
HCT: 24.5 % — ABNORMAL LOW (ref 39.0–52.0)
Hemoglobin: 7.2 g/dL — ABNORMAL LOW (ref 13.0–17.0)
MCH: 23.5 pg — ABNORMAL LOW (ref 26.0–34.0)
MCHC: 29.4 g/dL — ABNORMAL LOW (ref 30.0–36.0)
MCV: 79.8 fL — ABNORMAL LOW (ref 80.0–100.0)
Platelets: 100 10*3/uL — ABNORMAL LOW (ref 150–400)
RBC: 3.07 MIL/uL — ABNORMAL LOW (ref 4.22–5.81)
RDW: 19.4 % — ABNORMAL HIGH (ref 11.5–15.5)
WBC: 2.1 10*3/uL — ABNORMAL LOW (ref 4.0–10.5)
nRBC: 0 % (ref 0.0–0.2)

## 2021-04-29 LAB — BLOOD GAS, VENOUS
Acid-base deficit: 2.6 mmol/L — ABNORMAL HIGH (ref 0.0–2.0)
Bicarbonate: 21.5 mmol/L (ref 20.0–28.0)
FIO2: 21
O2 Saturation: 38.6 %
Patient temperature: 98.6
pCO2, Ven: 36.8 mmHg — ABNORMAL LOW (ref 44.0–60.0)
pH, Ven: 7.385 (ref 7.250–7.430)
pO2, Ven: <31 mmHg — CL (ref 32.0–45.0)

## 2021-04-29 LAB — COMPREHENSIVE METABOLIC PANEL
ALT: 21 U/L (ref 0–44)
AST: 26 U/L (ref 15–41)
Albumin: 3.3 g/dL — ABNORMAL LOW (ref 3.5–5.0)
Alkaline Phosphatase: 118 U/L (ref 38–126)
Anion gap: 7 (ref 5–15)
BUN: 14 mg/dL (ref 8–23)
CO2: 22 mmol/L (ref 22–32)
Calcium: 8.9 mg/dL (ref 8.9–10.3)
Chloride: 114 mmol/L — ABNORMAL HIGH (ref 98–111)
Creatinine, Ser: 0.74 mg/dL (ref 0.61–1.24)
GFR, Estimated: >60 mL/min (ref >60)
Glucose, Bld: 73 mg/dL (ref 70–99)
Potassium: 3.6 mmol/L (ref 3.5–5.1)
Sodium: 143 mmol/L (ref 135–145)
Total Bilirubin: 1.3 mg/dL — ABNORMAL HIGH (ref 0.3–1.2)
Total Protein: 7 g/dL (ref 6.5–8.1)

## 2021-04-29 LAB — FOLATE: Folate: 12.9 ng/mL (ref >5.9)

## 2021-04-29 LAB — CBG MONITORING, ED
Glucose-Capillary: 62 mg/dL — ABNORMAL LOW (ref 70–99)
Glucose-Capillary: 84 mg/dL (ref 70–99)

## 2021-04-29 LAB — IRON AND TIBC
Iron: 19 ug/dL — ABNORMAL LOW (ref 45–182)
Saturation Ratios: 4 % — ABNORMAL LOW (ref 17.9–39.5)
TIBC: 442 ug/dL (ref 250–450)
UIBC: 423 ug/dL

## 2021-04-29 LAB — GLUCOSE, CAPILLARY
Glucose-Capillary: 82 mg/dL (ref 70–99)
Glucose-Capillary: 121 mg/dL — ABNORMAL HIGH (ref 70–99)
Glucose-Capillary: 62 mg/dL — ABNORMAL LOW (ref 70–99)
Glucose-Capillary: 135 mg/dL — ABNORMAL HIGH (ref 70–99)

## 2021-04-29 LAB — HEMOGLOBIN AND HEMATOCRIT, BLOOD
HCT: 24.2 % — ABNORMAL LOW (ref 39.0–52.0)
HCT: 23.5 % — ABNORMAL LOW (ref 39.0–52.0)
Hemoglobin: 7.3 g/dL — ABNORMAL LOW (ref 13.0–17.0)
Hemoglobin: 7.1 g/dL — ABNORMAL LOW (ref 13.0–17.0)

## 2021-04-29 LAB — SARS CORONAVIRUS 2 (TAT 6-24 HRS): SARS Coronavirus 2: NEGATIVE

## 2021-04-29 LAB — FERRITIN: Ferritin: 11 ng/mL — ABNORMAL LOW (ref 24–336)

## 2021-04-29 LAB — MAGNESIUM: Magnesium: 2 mg/dL (ref 1.7–2.4)

## 2021-04-29 LAB — PHOSPHORUS: Phosphorus: 3.4 mg/dL (ref 2.5–4.6)

## 2021-04-29 LAB — PROTIME-INR
INR: 1.3 — ABNORMAL HIGH (ref 0.8–1.2)
Prothrombin Time: 16.3 seconds — ABNORMAL HIGH (ref 11.4–15.2)

## 2021-04-29 MED ORDER — PEG 3350-KCL-NA BICARB-NACL 420 G PO SOLR
4000.0000 mL | Freq: Once | ORAL | Status: AC
  Administered 2021-04-30: 4000 mL via ORAL

## 2021-04-29 MED ORDER — PROSOURCE PLUS PO LIQD
30.0000 mL | Freq: Two times a day (BID) | ORAL | Status: DC
  Administered 2021-04-29 – 2021-05-05 (×10): 30 mL via ORAL
  Filled 2021-04-29 (×10): qty 30

## 2021-04-29 MED ORDER — ENSURE ENLIVE PO LIQD
237.0000 mL | ORAL | Status: DC
  Administered 2021-05-02 – 2021-05-05 (×3): 237 mL via ORAL

## 2021-04-29 MED ORDER — FUROSEMIDE 10 MG/ML IJ SOLN
40.0000 mg | Freq: Every day | INTRAMUSCULAR | Status: DC
  Administered 2021-04-29 – 2021-05-03 (×5): 40 mg via INTRAVENOUS
  Filled 2021-04-29 (×6): qty 4

## 2021-04-29 MED ORDER — ADULT MULTIVITAMIN W/MINERALS CH
1.0000 | ORAL_TABLET | Freq: Every day | ORAL | Status: DC
  Administered 2021-04-29 – 2021-05-05 (×7): 1 via ORAL
  Filled 2021-04-29 (×7): qty 1

## 2021-04-29 NOTE — Progress Notes (Signed)
Initial Nutrition Assessment  DOCUMENTATION CODES:   Obesity unspecified  INTERVENTION:  - will order Ensure Plus once/day, each supplement provides 350 kcal and 13 grams of protein. - will order 30 ml Prosource Plus BID, each supplement provides 100 kcal and 15 grams protein.  - will order 1 tablet multivitamin with minerals/day.    NUTRITION DIAGNOSIS:   Increased nutrient needs related to chronic illness as evidenced by estimated needs.  GOAL:   Patient will meet greater than or equal to 90% of their needs  MONITOR:   PO intake, Supplement acceptance, Labs, Weight trends  REASON FOR ASSESSMENT:   Malnutrition Screening Tool  ASSESSMENT:   75 year old male with medical history of chronic anemia, recurrent GIB, liver cirrhosis, CHF, type 2 DM, and COPD. He presented to the ED with progressively worsening shortness of breath x3-4 days, orthopnea, and increased BLE edema.  Diet advanced from CLD to Soft today at 1348 and will change back to CLD at midnight. No meal intake percentages documented since admission.  Patient sleeping soundly at the time of RD visit earlier this afternoon. No family or visitors present in the room at that time.   He was seen by a [place] RD on 03/05/21. RD provided patient with Cirrhosis Nutrition Therapy during that visit.   Weight today is 219 lb and weight on 7/7 was 239 lb. This indicates 20 lb weight loss (8.4% body weight) in the past 2 months. Unsure if patient has had any fluid accumulation changes over the past 2 months.   No information documented in the edema section of flow sheet since admission.   GI following and planning for EGD and colonoscopy on 9/3.    Labs reviewed; CBG: 82 mg/dl, Cl: 114 mmol/l. Medications reviewed; 40 mg IV lasix/day, sliding scale novolog, 40 mg IV protonix BID.     NUTRITION - FOCUSED PHYSICAL EXAM:  Unable to complete at this time.   Diet Order:   Diet Order             Diet clear liquid  Room service appropriate? Yes; Fluid consistency: Thin  Diet effective midnight           DIET SOFT Room service appropriate? Yes; Fluid consistency: Thin  Diet effective now                   EDUCATION NEEDS:   Not appropriate for education at this time  Skin:  Skin Assessment: Reviewed RN Assessment  Last BM:  9/1  Height:   Ht Readings from Last 1 Encounters:  04/28/21 5\' 8"  (1.727 m)    Weight:   Wt Readings from Last 1 Encounters:  04/29/21 99.2 kg     Estimated Nutritional Needs:  Kcal:  7893-8101 kcal Protein:  100-115 grams Fluid:  >/= 1.8 L/day      Jarome Matin, MS, RD, LDN, CNSC Inpatient Clinical Dietitian RD pager # available in AMION  After hours/weekend pager # available in [facility]

## 2021-04-29 NOTE — Consult Note (Signed)
Referring Provider: (Dr. Lupita Leash) [facility] Primary Care Physician:  [name], MD Primary Gastroenterologist: [facility]  Reason for Consultation:  Symptomatic anemia  HPI: [name] is a 75 y.o. male with past medical history noted below to include hepatic cirrhosis, HCC, history of GI bleed with (gastric AVMs in 2019, portal hypertensive gastropathy and grade 1 esophageal varices in 2021), type 1 isolated gastric varices, s/p TIPS 01/2020, GERD/Barrett's esophagus, CAD s/p CABG, HFpEF (EF 55-60% as of 07/2019) presenting for consultation of symptomatic anemia.    Patient presented to the ED yesterday with symptomatic anemia.  He reports recent worsening shortness of breath.  He denies melena or hematochezia, but per RN he has had 2 brown BMs mixed with red blood.  He denies any abdominal pain, nausea, vomiting, hematemesis, changes in appetite, or unexplained weight loss.  Denies ASA, NSAID, or blood thinner use.  No alcohol for the past 3-4 months.   Due to history of anemia and GI bleeding, he has had multiple prior GI procedures, noted below:  Last EGD 02/2021: Gastric varices, without bleeding. Portal hypertensive gastropathy. Tiny HH. Chronic gastritis.  EGD 12/2020: Small esophageal varices, acute gastritis, Type 1 isolated gastric varices (IGV1, varices located in the fundus), without bleeding. Portal hypertensive gastropathy.  Capsule endo 11/2020: No active bleeding, one or two non-bleeding vascular lesions noted in region of IC valve or proximal colon   EGD 10/2020: Minimal spontaneious oozing from body of stomach, possibly localized portal gastropathy or gastritis, without blood in stomach at start of procedure, therefore this finding is of uncertain significance. Grade I esophageal varices. Hemorrhagic gastropathy as described above. Type 1 isolated gastric varices (IGV1, varices located in the fundus), without bleeding.  Normal examined duodenum.   Last colonoscopy 11/20/20: No  definite source of patient's recurrent anemia seen on this study. Two diminutive polyps in the transverse colon, removed with a cold snare. Resected and retrieved. One 4 mm polyp in the distal rectum, removed with a cold snare. Resected and retrieved. Erythematous mucosa in the proximal ascending colon.  Past Medical History:  Diagnosis Date   Arthritis    "joints tighten up sometimes" (03/27/2104)   Carcinoma of liver, hepatocellular ([place]) 06/30/2020   Chronic lower back pain    Coronary artery disease involving native coronary artery of native heart without angina pectoris    Severe left main disease at catheterization July 2015  CABG x3 with a LIMA to the LAD, SVG to the OM, SVG to the PDA on 03/31/14. EF 60% by cath.    Diastolic heart failure (HCC)    GERD without esophagitis 08/04/2010   Hepatic cirrhosis ([place])    a. Dx 01/2014 - CT a/p    History of blood transfusion    "related to bleeding ulcers"   History of concussion    1976--  NO RESIDUAL   History of GI bleed    a. UGIB 07/2012;  b. 01/2014 admission with GIB/FOB stool req 1U prbc's->EGD showed portal gastropathy, barrett's esoph, and chronic active h. pylori gastritis.   History of gout    2007 &  2008  LEFT LEG-- NO ISSUE SINCE   Hyperlipidemia    Iron deficiency anemia    Kidney stones    OA (osteoarthritis of spine)    LOWER BACK--  INTERMITTANT LEFT LEG NUMBNESS   OSA (obstructive sleep apnea)    PULMOLOGIST-  DR CLANCE--  MODERATE OSA  STARTED CPAP 2012--  BUT CURRENTLY HAS NOT USED PAST 6 MONTHS  Phimosis    a. s/p circumcision 2015.   Type 2 diabetes mellitus ([place])    Unspecified essential hypertension     Past Surgical History:  Procedure Laterality Date   ANKLE FRACTURE SURGERY Right 1989   "plate put in"   APPENDECTOMY  05-16-2004   open   BIOPSY  01/08/2021   Procedure: BIOPSY;  Surgeon: Wilford Corner, MD;  Location: [facility];  Service: Endoscopy;;   CIRCUMCISION N/A 09/09/2013   Procedure:  CIRCUMCISION ADULT;  Surgeon: Bernestine Amass, MD;  Location: [facility];  Service: Urology;  Laterality: N/A;   COLECTOMY  05-16-2004   COLONOSCOPY WITH PROPOFOL N/A 11/20/2020   Procedure: COLONOSCOPY WITH PROPOFOL;  Surgeon: Ronald Lobo, MD;  Location: [facility];  Service: Endoscopy;  Laterality: N/A;   CORONARY ANGIOPLASTY WITH STENT PLACEMENT  06/28/2012  DR COOPER   PCI W/  X1 DES to [place]. LAD/  LM  40% OSTIAL & 50-60% DISTAL /  50% PROX LCX/  30-40% PROX RCA & 50% MID RCA/   LVEF 65-70%   CORONARY ARTERY BYPASS GRAFT N/A 03/31/2014   Procedure: CORONARY ARTERY BYPASS GRAFTING (CABG) times 3 using left internal mammary artery and right saphenous vein.;  Surgeon: Melrose Nakayama, MD;  Location: [place];  Service: Open Heart Surgery;  Laterality: N/A;   DOBUTAMINE STRESS ECHO  06-08-2012   MODERATE HYPOKINESIS/ ISCHEMIA MID INFERIOR WALL   ESOPHAGOGASTRODUODENOSCOPY  08/15/2012   Procedure: ESOPHAGOGASTRODUODENOSCOPY (EGD);  Surgeon: Wonda Horner, MD;  Location: [facility];  Service: Endoscopy;  Laterality: N/A;   ESOPHAGOGASTRODUODENOSCOPY N/A 02/17/2014   Procedure: ESOPHAGOGASTRODUODENOSCOPY (EGD);  Surgeon: Jeryl Columbia, MD;  [facility] END[facility];  Service: Endoscopy;  Laterality: N/A;   ESOPHAGOGASTRODUODENOSCOPY N/A 04/17/2020   Procedure: ESOPHAGOGASTRODUODENOSCOPY (EGD);  Surgeon: Ronnette Juniper, MD;  Location: [place];  Service: Gastroenterology;  Laterality: N/A;   ESOPHAGOGASTRODUODENOSCOPY N/A 09/11/2020   Procedure: ESOPHAGOGASTRODUODENOSCOPY (EGD);  Surgeon: Clarene Essex, MD;  Location: [facility];  Service: Endoscopy;  Laterality: N/A;   ESOPHAGOGASTRODUODENOSCOPY  10/09/2020   ESOPHAGOGASTRODUODENOSCOPY N/A 10/09/2020   Procedure: ESOPHAGOGASTRODUODENOSCOPY (EGD);  Surgeon: Otis Brace, MD;  Location: [facility] ENDOSCOPY;  Service: Gastroenterology;  Laterality: N/A;   ESOPHAGOGASTRODUODENOSCOPY N/A 01/08/2021   Procedure: ESOPHAGOGASTRODUODENOSCOPY  (EGD);  Surgeon: Wilford Corner, MD;  Location: [facility];  Service: Endoscopy;  Laterality: N/A;   ESOPHAGOGASTRODUODENOSCOPY (EGD) WITH PROPOFOL N/A 09/30/2017   Procedure: ESOPHAGOGASTRODUODENOSCOPY (EGD) WITH PROPOFOL;  Surgeon: Wonda Horner, MD;  Location[facility] ENDOSCOPY;  Service: Endoscopy;  Laterality: N/A;   ESOPHAGOGASTRODUODENOSCOPY (EGD) WITH PROPOFOL N/A 12/20/2017   Procedure: ESOPHAGOGASTRODUODENOSCOPY (EGD) WITH PROPOFOL;  Surgeon: Clarene Essex, MD;  Location: [place];  Service: Endoscopy;  Laterality: N/A;   ESOPHAGOGASTRODUODENOSCOPY (EGD) WITH PROPOFOL N/A 08/10/2020   Procedure: ESOPHAGOGASTRODUODENOSCOPY (EGD) WITH PROPOFOL;  Surgeon: Wilford Corner, MD;  Location: [place];  Service: Endoscopy;  Laterality: N/A;   ESOPHAGOGASTRODUODENOSCOPY (EGD) WITH PROPOFOL N/A 11/20/2020   Procedure: ESOPHAGOGASTRODUODENOSCOPY (EGD) WITH PROPOFOL;  Surgeon: Ronald Lobo, MD;  Location: [facility];  Service: Endoscopy;  Laterality: N/A;   ESOPHAGOGASTRODUODENOSCOPY (EGD) WITH PROPOFOL N/A 02/28/2021   Procedure: ESOPHAGOGASTRODUODENOSCOPY (EGD) WITH PROPOFOL;  Surgeon: Clarene Essex, MD;  Location: [facility];  Service: Endoscopy;  Laterality: N/A;   GIVENS CAPSULE STUDY N/A 11/20/2020   Procedure: GIVENS CAPSULE STUDY;  Surgeon: Ronald Lobo, MD;  Location: [facility];  Service: Endoscopy;  Laterality: N/A;   HERNIA REPAIR     INTRAOPERATIVE TRANSESOPHAGEAL ECHOCARDIOGRAM N/A 03/31/2014   Procedure: INTRAOPERATIVE TRANSESOPHAGEAL ECHOCARDIOGRAM;  Surgeon:  Melrose Nakayama, MD;  Location: [place];  Service: Open Heart Surgery;  Laterality: N/A;   IR ANGIOGRAM SELECTIVE EACH ADDITIONAL VESSEL  06/16/2020   IR ANGIOGRAM SELECTIVE EACH ADDITIONAL VESSEL  06/16/2020   IR ANGIOGRAM SELECTIVE EACH ADDITIONAL VESSEL  06/16/2020   IR ANGIOGRAM SELECTIVE EACH ADDITIONAL VESSEL  06/16/2020   IR ANGIOGRAM SELECTIVE EACH ADDITIONAL VESSEL  06/16/2020   IR ANGIOGRAM SELECTIVE EACH  ADDITIONAL VESSEL  06/16/2020   IR ANGIOGRAM SELECTIVE EACH ADDITIONAL VESSEL  06/16/2020   IR ANGIOGRAM SELECTIVE EACH ADDITIONAL VESSEL  06/16/2020   IR ANGIOGRAM SELECTIVE EACH ADDITIONAL VESSEL  06/16/2020   IR ANGIOGRAM SELECTIVE EACH ADDITIONAL VESSEL  06/30/2020   IR ANGIOGRAM SELECTIVE EACH ADDITIONAL VESSEL  06/30/2020   IR ANGIOGRAM VISCERAL SELECTIVE  06/16/2020   IR ANGIOGRAM VISCERAL SELECTIVE  06/30/2020   IR EMBO ARTERIAL NOT HEMORR HEMANG INC GUIDE ROADMAPPING  06/16/2020   IR EMBO TUMOR ORGAN ISCHEMIA INFARCT INC GUIDE ROADMAPPING  06/30/2020   IR EMBO VENOUS NOT HEMORR HEMANG  INC GUIDE ROADMAPPING  01/13/2021   IR IVUS EACH ADDITIONAL NON CORONARY VESSEL  01/13/2021   IR RADIOLOGIST EVAL & MGMT  05/06/2020   IR RADIOLOGIST EVAL & MGMT  05/20/2020   IR TIPS  01/13/2021   IR US GUIDE VASC ACCESS RIGHT  06/16/2020   IR US GUIDE VASC ACCESS RIGHT  06/30/2020   IR US GUIDE VASC ACCESS RIGHT  01/13/2021   LAPAROSCOPIC UMBILICAL HERNIA REPAIR W/ MESH  06-06-2011   LEFT HEART CATHETERIZATION WITH CORONARY ANGIOGRAM N/A 03/28/2014   Procedure: LEFT HEART CATHETERIZATION WITH CORONARY ANGIOGRAM;  Surgeon: Sinclair Grooms, MD;  Location[facility] C[facility] LAB;  Service: Cardiovascular;  Laterality: N/A;   LIPOMA EXCISION Left 08/25/2017   Procedure: EXCISION LIPOMA LEFT POSTERIOR THIGH;  Surgeon: Clovis Riley, MD;  Location: [place];  Service: General;  Laterality: Left;   NEPHROLITHOTOMY  1990'S   OPEN APPENDECTOMY W/ PARTIAL CECECTOMY  05-16-2004   PERCUTANEOUS CORONARY STENT INTERVENTION (PCI-S) N/A 06/28/2012   Procedure: PERCUTANEOUS CORONARY STENT INTERVENTION (PCI-S);  Surgeon: Sherren Mocha, MD;  Location: [facility] CATH LAB;  Service: Cardiovascular;  Laterality: N/A;   POLYPECTOMY  11/20/2020   Procedure: POLYPECTOMY;  Surgeon: Ronald Lobo, MD;  [facility];  Service: Endoscopy;;   RADIOLOGY WITH ANESTHESIA N/A 01/13/2021   Procedure: IR WITH ANESTHESIA - TIPS;  Surgeon: Suzette Battiest, MD;  Location: [place];  Service: Radiology;  Laterality: N/A;    Prior to Admission medications   Medication Sig Start Date End Date Taking? Authorizing Provider  atorvastatin (LIPITOR) 40 MG tablet Take 40 mg by mouth daily.   Yes [provider]  Accu-Chek Softclix Lancets lancets USE AS DIRECTED TO TEST BLOOD SUGAR FOUR TIMES DAILY 03/13/20   [name], MD  albuterol (VENTOLIN HFA) 108 (90 Base) MCG/ACT inhaler Inhale 2 puffs into the lungs every 6 (six) hours as needed for wheezing or shortness of breath. 03/01/21   Aline August, MD  blood glucose meter kit and supplies Dispense based on patient and insurance preference. Use up to four times daily as directed. (FOR ICD-10 E10.9, E11.9). 03/12/20   [name], MD  furosemide (LASIX) 40 MG tablet TAKE 1 TABLET(40 MG) BY MOUTH DAILY Patient taking differently: Take 40 mg by mouth daily. 04/28/21   [name], MD  gabapentin (NEURONTIN) 100 MG capsule Take 100 mg by mouth 2 (two) times daily. 03/05/21   [provider]  lactulose (CHRONULAC) 10 GM/15ML solution Take 45 mLs (30 g total) by mouth 3 (three) times daily. 03/31/21   Debbe Odea, MD  metFORMIN (GLUCOPHAGE) 850 MG tablet TAKE 1 TABLET(850 MG) BY MOUTH TWICE DAILY WITH A MEAL Patient taking differently: Take 850 mg by mouth 2 (two) times daily with a meal. 01/08/21   [name], MD  nitroGLYCERIN (NITROSTAT) 0.4 MG SL tablet Place 1 tablet (0.4 mg total) under the tongue every 5 (five) minutes as needed for chest pain. 06/08/16   Nahser, Wonda Cheng, MD  pantoprazole (PROTONIX) 40 MG tablet Take 1 tablet (40 mg total) by mouth daily. 03/31/21   Debbe Odea, MD  rifaximin (XIFAXAN) 550 MG TABS tablet Take 1 tablet (550 mg total) by mouth 2 (two) times daily. 03/31/21   Debbe Odea, MD  spironolactone (ALDACTONE) 50 MG tablet Take 1 tablet (50 mg total) by mouth daily. 03/07/21   Ghimire, Henreitta Leber, MD  umeclidinium-vilanterol  (ANORO ELLIPTA) 62.5-25 MCG/INH AEPB Inhale 1 puff into the lungs daily. 03/12/20   [name], MD    Scheduled Meds:  furosemide  40 mg Intravenous Daily   insulin aspart  0-6 Units Subcutaneous Q6H   pantoprazole (PROTONIX) IV  40 mg Intravenous Q12H   umeclidinium-vilanterol  1 puff Inhalation Daily   Continuous Infusions:  cefTRIAXone (ROCEPHIN)  IV Stopped (04/28/21 2326)   PRN Meds:.acetaminophen **OR** acetaminophen  Allergies as of 04/28/2021 - Review Complete 04/28/2021  Allergen Reaction Noted   Fluzone quadrivalent [influenza vac split quad] Other (See Comments) 01/07/2021    Family History  Problem Relation Age of Onset   Lung cancer Sister    Cancer Sister        lung   Cancer Mother    Cancer Father        died in his 42s.   Cancer Brother        lung   Coronary artery disease Other    Diabetes Other    Colon cancer Other    Cancer Sister        lung    Social History   Socioeconomic History   Marital status: Married    Spouse name: Not on file   Number of children: Not on file   Years of education: Not on file   Highest education level: Not on file  Occupational History   Not on file  Tobacco Use   Smoking status: Former    Packs/day: 2.00    Years: 40.00    Pack years: 80.00    Types: Cigarettes    Quit date: 08/29/1996    Years since quitting: 24.6   Smokeless tobacco: Never  Vaping Use   Vaping Use: Never used  Substance and Sexual Activity   Alcohol use: Not Currently    Alcohol/week: 8.0 standard drinks    Types: 8 Cans of beer per week    Comment: 2 beers every other day   Drug use: No   Sexual activity: Yes  Other Topics Concern   Not on file  Social History Narrative   Lives in [place] with wife, son, and son's family.   Social Determinants of Health   Financial Resource Strain: Medium Risk   Difficulty of Paying Living Expenses: Somewhat hard  Food Insecurity: No Food Insecurity   Worried About Charity fundraiser  in the Last Year: Never true   Ran Out of Food in the Last Year: Never true  Transportation Needs: No Transportation Needs  Lack of Transportation (Medical): No   Lack of Transportation (Non-Medical): No  Physical Activity: Not on file  Stress: Not on file  Social Connections: Not on file  Intimate Partner Violence: Not on file    Review of Systems: Review of Systems  Constitutional:  Negative for fever and weight loss.  HENT:  Negative for hearing loss and tinnitus.   Eyes:  Negative for pain and redness.  Respiratory:  Positive for shortness of breath. Negative for cough.   Cardiovascular:  Negative for chest pain and palpitations.  Gastrointestinal:  Positive for blood in stool. Negative for abdominal pain, constipation, diarrhea, heartburn, melena, nausea and vomiting.  Genitourinary:  Negative for flank pain and hematuria.  Musculoskeletal:  Negative for falls and joint pain.  Skin:  Negative for itching and rash.  Neurological:  Negative for seizures and loss of consciousness.  Endo/Heme/Allergies:  Negative for polydipsia. Does not bruise/bleed easily.  Psychiatric/Behavioral:  Negative for substance abuse. The patient is not nervous/anxious.     Physical Exam: Vital signs: Vitals:   04/29/21 0916 04/29/21 1216  BP:  (!) 146/59  Pulse:  65  Resp:  20  Temp:    SpO2: 98% 100%     Physical Exam Vitals reviewed.  Constitutional:      General: He is not in acute distress.    Appearance: He is obese. He is ill-appearing.  HENT:     Head: Normocephalic and atraumatic.     Nose: Nose normal. No congestion.     Mouth/Throat:     Mouth: Mucous membranes are moist.     Pharynx: Oropharynx is clear.  Eyes:     General: Scleral icterus present.     Extraocular Movements: Extraocular movements intact.     Comments: Conjunctival pallor  Cardiovascular:     Rate and Rhythm: Normal rate and regular rhythm.  Pulmonary:     Effort: Pulmonary effort is normal. No  respiratory distress.  Abdominal:     General: Bowel sounds are normal. There is distension.     Palpations: Abdomen is soft. There is no mass.     Tenderness: There is no abdominal tenderness. There is no guarding or rebound.  Musculoskeletal:     Cervical back: Normal range of motion and neck supple.     Right lower leg: Edema present.     Left lower leg: Edema present.  Skin:    General: Skin is warm and dry.     Coloration: Skin is pale. Skin is not jaundiced.  Neurological:     General: No focal deficit present.     Mental Status: He is alert and oriented to person, place, and time.  Psychiatric:        Mood and Affect: Mood normal.        Behavior: Behavior normal.    GI:  Lab Results: Recent Labs    04/28/21 1843 04/29/21 0450 04/29/21 0903  WBC 2.2* 2.1*  --   HGB 5.9* 7.2* 7.3*  HCT 20.8* 24.5* 24.2*  PLT 107* 100*  --    BMET Recent Labs    04/28/21 1843 04/29/21 0450  NA 144 143  K 4.1 3.6  CL 119* 114*  CO2 20* 22  GLUCOSE 96 73  BUN 14 14  CREATININE 0.93 0.74  CALCIUM 8.9 8.9   LFT Recent Labs    04/29/21 0450  PROT 7.0  ALBUMIN 3.3*  AST 26  ALT 21  ALKPHOS 118  BILITOT 1.3*   PT/INR  Recent Labs    04/29/21 0450  LABPROT 16.3*  INR 1.3*     Studies/Results: DG Chest 2 View  Result Date: 04/28/2021 CLINICAL DATA:  Dyspnea upon exertion. EXAM: CHEST - 2 VIEW COMPARISON:  March 28, 2021 FINDINGS: Multiple sternal wires and vascular clips are seen. Moderate severity diffusely increased interstitial lung markings are seen with associated prominence of the pulmonary vasculature. There is no evidence of a pleural effusion or pneumothorax. The cardiac silhouette is mildly enlarged and unchanged in size. There is marked severity calcification of the aortic arch. The visualized skeletal structures are unremarkable. IMPRESSION: Findings consistent with moderate severity congestive heart failure. Electronically Signed   By: Virgina Norfolk M.D.    On: 04/28/2021 19:07    Impression: Acute on chronic iron deficiency anemia: -Now having reddish stools, concern for bleeding from cecal AVM vs. Upper GI source -Hgb 7.2, improved from 5.9 yesterday after 2u pRBCs -Normal BUN 14 and Cr 0.74 -Ferritin 11 -Iron 19, saturation 4%  Alcohol-related cirrhosis, not currently consuming alcohol; MELD score of 10 (6% estimated 76-monthmortality) as of 04/29/21 -INR 1.3 -T. Bili 1.3, otherwise normal LFTS (AST 26/ ALT 21/ ALP 118) -Platelets 100K/uL  History of TIPS in 6/20220 due to recurrent upper GI bleeding due to gastric varices and portal hypertensive gastropathy; [facility]  GERD/Barrett's esophagus  CAD s/p CABG  HFpEF (EF 55-60% as of 07/2019)  Plan: There is no current anesthesia availability for procedures tomorrow 9/2 and patient is currently stable.  Thus, plan for EGD and colonoscopy on Saturday 9/3. I thoroughly discussed the procedures with the patient to include nature, alternatives, benefits, and risks (including but not limited to bleeding, infection, perforation, anesthesia/cardiac and pulmonary complications). Patient verbalized understanding and gave verbal consent to proceed with EGD and colonoscopy.  Per patient's request, I also updated his son JNolberto Cheuvront  Soft diet OK today with clear liquid diet starting tomorrow Friday 9/2.  Plan for NuLYTELY prep starting tomorrow.  Continue Rocephin 2g IV daily for SBP prophylaxis in the setting of cirrhosis with GI bleeding.  Continue Protonix 40 mg BID.  Continue to monitor H&H with transfusion as needed to maintain Hgb >7.  [facility] will follow.    LOS: 1 day   ASalley Slaughter PA-C 04/29/2021, 12:56 PM  Contact #  [phone]

## 2021-04-29 NOTE — Progress Notes (Signed)
PROGRESS NOTE    [name]  GMW:[id number] DOB: [birth year] DOA: 04/28/2021 PCP: [name], MD   Chief Complaint  Patient presents with   Abdominal Distention   Shortness of Breath   Brief Narrative: 75 year old male with chronic anemia Baseline hemoglobin 7-8.5 g recurrent GI bleed, liver cirrhosis, chronic diastolic CHF, Y4IH, history of pancytopenia, recent WBC count 2.3 on 7/31 COPD presented to the ED with shortness of breath for 3 to 4 days progressively worse with orthopnea mild increase in edema lower leg, no chest pain. He has history of liver cirrhosis with recurrent upper GI bleed most recent EGD 7/3 by Dr. Driscilla Grammes no evidence of active or recent bleed, no esophageal varices, mild portal hypertensive gastropathy, small hiatal hernia, gastric varices without evidence of bleeding, chronic gastritis was noted.  In the ED hemoglobin 5.9 g with leukopenia to 200 platelet 107.  Chest x-ray showed moderate severity congestive heart failure, bnp at 360, 2 units prbc transfusion was ordered along with IV Lasix and patient was admitted.Lab work showed iron deficiency anemia ferritin 11 iron 19. H&H posttransfusion improved to 7.2 g.  Fecal occult blood was positive without evidence of gross blood.  Subjective: Seen this morning.  Patient feels much improved no shortness of breath.  He is on room air.  Reports his leg edema is improving. Seen this morning H&H 7.2 g, blood pressure stable, on room air.  Assessment & Plan:  Acute on chronic anemia Symptomatic anemia: History of recurrent GI bleed FOBT positive stool.  Units PRBC in the ED hemoglobin up.  Repeat H&H.  Labs showed iron deficiency ferritin at 11.  If further drop in hemoglobin will need to discuss with GI,and will need additional transfusion h/h pending this am Recent Labs  Lab 04/28/21 1843 04/29/21 0450  HGB 5.9* 7.2*  HCT 20.8* 24.5*    FOBT positive stool-no overt acute GI bleeding History of recurrent  GI bleed Blackish stool at home He has history of liver cirrhosis with recurrent upper GI bleed most recent EGD 7/3 by Dr. Driscilla Grammes no evidence of active or recent bleed, no esophageal varices, mild portal hypertensive gastropathy, small hiatal hernia, gastric varices without evidence of bleeding, chronic gastritis was noted.  He endorses black his stool at home.  FOBT positive in ED, h/h low on admission, will ask GI to see him,  repeat h/h. Cont pi bid for now.  Liver cirrhosis chronic:suspected to be alcohol related with portal hypertension history of hepatic encephalopathy, with ongoing pancytopenia. At home on Lasix, Aldactone, lactulose.  INR is stable  Acute on chronic diastolic CHF: Congested chest x-ray BNP slightly positive.  With progressive shortness of breath likely in the setting of anemia as well as CHF.  Still has leg edema but respiratory wise he feels he is improving, repeat chest x-ray, continue IV Lasix  daily-monitor intake output strictly, daily weight, monitor electrolytes.  Caution with transfusion.  He had a good output with IV Lasix in the ED. Filed Weights   04/28/21 1825 04/29/21 0839  Weight: 108.9 kg 99.2 kg  Net IO Since Admission: -4,275 mL [04/29/21 1041]   Intake/Output Summary (Last 24 hours) at 04/29/2021 1041 Last data filed at 04/29/2021 0758 Gross per 24 hour  Intake 870 ml  Output 5145 ml  Net -4275 ml    Pancytopenia-with leukopenia thrombocytopenia.  In the setting of liver cirrhosis, chronic history.  WBC count 2.3 on 7/31  CBC Latest Ref Rng & Units 04/29/2021 04/28/2021 03/29/2021  WBC 4.0 - 10.5 K/uL 2.1(L) 2.2(L) -  Hemoglobin 13.0 - 17.0 g/dL 7.2(L) 5.9(LL) 8.5(L)  Hematocrit 39.0 - 52.0 % 24.5(L) 20.8(L) 28.0(L)  Platelets 150 - 400 K/uL 100(L) 107(L) -    Asymptomatic pyuria 11-20.  Monitor  Type 2 diabetes mellitus with hypoglycemia not on insulin, on metformin holding for now.  Sliding scale and monitor cbg 62> 84,. Recent Labs  Lab  04/29/21 0036 04/29/21 0155  GLUCAP 62* 84    COPD/former smoker: Continue his home inhaler  Class I obesity with BMI 33: will benefit with healthy Lifestyle,PCP follow-up Will benefit with weight Diet Order             Diet clear liquid Room service appropriate? Yes; Fluid consistency: Thin  Diet effective now                 Patient's Body mass index is 33.25 kg/m. DVT prophylaxis: SCDs Start: 04/29/21 0349 SCDs Start: 04/28/21 2158 Code Status:   Code Status: Full Code  Family Communication: plan of care discussed with patient at bedside.  Status is: Inpatient Remains inpatient appropriate because:Ongoing diagnostic testing needed not appropriate for outpatient work up and Inpatient level of care appropriate due to severity of illness Dispo: The patient is from: Home              Anticipated d/c is to: Home              Patient currently is not medically stable to d/c.   Difficult to place patient No  Unresulted Labs (From admission, onward)     Start     Ordered   04/29/21 0900  Hemoglobin and hematocrit, blood  Once-Timed,   STAT        04/28/21 2212   04/28/21 2214  Methylmalonic acid, serum  Add-on,   AD        04/28/21 2213            Medications reviewed:  Scheduled Meds:  furosemide  40 mg Intravenous BID   insulin aspart  0-6 Units Subcutaneous Q6H   pantoprazole (PROTONIX) IV  40 mg Intravenous Q12H   umeclidinium-vilanterol  1 puff Inhalation Daily   Continuous Infusions:  cefTRIAXone (ROCEPHIN)  IV Stopped (04/28/21 2326)   Consultants:see note  Procedures:see note Antimicrobials: Anti-infectives (From admission, onward)    Start     Dose/Rate Route Frequency Ordered Stop   04/28/21 2300  cefTRIAXone (ROCEPHIN) 2 g in sodium chloride 0.9 % 100 mL IVPB        2 g 200 mL/hr over 30 Minutes Intravenous Every 24 hours 04/28/21 2238        Culture/Microbiology    Component Value Date/Time   SDES BLOOD SITE NOT SPECIFIED 01/27/2021 1748    SDES BLOOD SITE NOT SPECIFIED 01/27/2021 1748   SPECREQUEST  01/27/2021 1748    BOTTLES DRAWN AEROBIC AND ANAEROBIC Blood Culture adequate volume   SPECREQUEST  01/27/2021 1748    BOTTLES DRAWN AEROBIC AND ANAEROBIC Blood Culture adequate volume   CULT  01/27/2021 1748    NO GROWTH 5 DAYS Performed at [facility], [place] [address], [place], [place] [address]    CULT  01/27/2021 1748    NO GROWTH 5 DAYS Performed at [place] [address], [place], [place] [address]    REPTSTATUS 02/01/2021 FINAL 01/27/2021 1748   REPTSTATUS 02/01/2021 FINAL 01/27/2021 1748    Other culture-see note  Objective: Vitals: Today's Vitals  04/29/21 0641 04/29/21 0800 04/29/21 0839 04/29/21 0916  BP: 139/61  132/65   Pulse: 71  72   Resp: 18  18   Temp: 98.1 F (36.7 C)  97.8 F (36.6 C)   TempSrc: Oral  Oral   SpO2: 100%  100% 98%  Weight:   99.2 kg   Height:      PainSc:  0-No pain      Intake/Output Summary (Last 24 hours) at 04/29/2021 1031 Last data filed at 04/29/2021 0758 Gross per 24 hour  Intake 870 ml  Output 5145 ml  Net -4275 ml   Filed Weights   04/28/21 1825 04/29/21 0839  Weight: 108.9 kg 99.2 kg   Weight change:   Intake/Output from previous day: 08/31 0701 - 09/01 0700 In: 870 [P.O.:120; Blood:650; IV Piggyback:100] Out: 4625 [Urine:4625] Intake/Output this shift: Total I/O In: -  Out: 520 [Urine:520] Filed Weights   04/28/21 1825 04/29/21 0839  Weight: 108.9 kg 99.2 kg   Examination: General exam: AAO 3, not in distress, pleasant, obese. HEENT:Oral mucosa moist, Ear/Nose WNL grossly,dentition normal. Respiratory system: bilaterally mild basal crackles no use of accessory muscle, non tender. Cardiovascular system: S1 & S2 +,No JVD. Gastrointestinal system: Abdomen soft, NT,ND, BS+. Nervous System:Alert, awake, moving extremities Extremities: Chronic appearing leg edema, distal peripheral pulses palpable.  Skin: No rashes,no icterus. MSK:  Normal muscle bulk,tone, power.  Data Reviewed: I have personally reviewed following labs and imaging studies CBC: Recent Labs  Lab 04/28/21 1843 04/29/21 0450  WBC 2.2* 2.1*  NEUTROABS 1.4*  --   HGB 5.9* 7.2*  HCT 20.8* 24.5*  MCV 78.2* 79.8*  PLT 107* 476*   Basic Metabolic Panel: Recent Labs  Lab 04/28/21 1843 04/29/21 0450  NA 144 143  K 4.1 3.6  CL 119* 114*  CO2 20* 22  GLUCOSE 96 73  BUN 14 14  CREATININE 0.93 0.74  CALCIUM 8.9 8.9  MG  --  2.0  PHOS  --  3.4   GFR: Estimated Creatinine Clearance: 91.1 mL/min (by C-G formula based on SCr of 0.74 mg/dL). Liver Function Tests: Recent Labs  Lab 04/28/21 1843 04/29/21 0450  AST 29 26  ALT 21 21  ALKPHOS 127* 118  BILITOT 1.2 1.3*  PROT 6.9 7.0  ALBUMIN 3.2* 3.3*   No results for input(s): LIPASE, AMYLASE in the last 168 hours. Recent Labs  Lab 04/28/21 1843  AMMONIA 93*   Coagulation Profile: Recent Labs  Lab 04/29/21 0450  INR 1.3*   Cardiac Enzymes: No results for input(s): CKTOTAL, CKMB, CKMBINDEX, TROPONINI in the last 168 hours. BNP (last 3 results) No results for input(s): PROBNP in the last 8760 hours. HbA1C: No results for input(s): HGBA1C in the last 72 hours. CBG: Recent Labs  Lab 04/29/21 0036 04/29/21 0155  GLUCAP 62* 84   Lipid Profile: No results for input(s): CHOL, HDL, LDLCALC, TRIG, CHOLHDL, LDLDIRECT in the last 72 hours. Thyroid Function Tests: No results for input(s): TSH, T4TOTAL, FREET4, T3FREE, THYROIDAB in the last 72 hours. Anemia Panel: Recent Labs    04/29/21 0450 04/29/21 0903  FOLATE  --  12.9  FERRITIN  --  11*  TIBC  --  442  IRON  --  19*  RETICCTPCT 0.9  --    Sepsis Labs: No results for input(s): PROCALCITON, LATICACIDVEN in the last 168 hours.  Recent Results (from the past 240 hour(s))  SARS CORONAVIRUS 2 (TAT 6-24 HRS) Nasopharyngeal Nasopharyngeal Swab     Status: None  Collection Time: 04/28/21  9:27 PM   Specimen: Nasopharyngeal  Swab  Result Value Ref Range Status   SARS Coronavirus 2 NEGATIVE NEGATIVE Final    Comment: (NOTE) SARS-CoV-2 target nucleic acids are NOT DETECTED.  The SARS-CoV-2 RNA is generally detectable in upper and lower respiratory specimens during the acute phase of infection. Negative results do not preclude SARS-CoV-2 infection, do not rule out co-infections with other pathogens, and should not be used as the sole basis for treatment or other patient management decisions. Negative results must be combined with clinical observations, patient history, and epidemiological information. The expected result is Negative.  Fact Sheet for Patients: SugarRoll.be  Fact Sheet for Healthcare Providers: https://www.woods-mathews.com/  This test is not yet approved or cleared by the [place] FDA and  has been authorized for detection and/or diagnosis of SARS-CoV-2 by FDA under an Emergency Use Authorization (EUA). This EUA will remain  in effect (meaning this test can be used) for the duration of the COVID-19 declaration under Se ction 564(b)(1) of the Act, 21 U.S.C. section 360bbb-3(b)(1), unless the authorization is terminated or revoked sooner.  Performed at [facility], [place] [address], [place], [place] [address]     Radiology Studies: DG Chest 2 View  Result Date: 04/28/2021 CLINICAL DATA:  Dyspnea upon exertion. EXAM: CHEST - 2 VIEW COMPARISON:  March 28, 2021 FINDINGS: Multiple sternal wires and vascular clips are seen. Moderate severity diffusely increased interstitial lung markings are seen with associated prominence of the pulmonary vasculature. There is no evidence of a pleural effusion or pneumothorax. The cardiac silhouette is mildly enlarged and unchanged in size. There is marked severity calcification of the aortic arch. The visualized skeletal structures are unremarkable. IMPRESSION: Findings consistent with moderate severity  congestive heart failure. Electronically Signed   By: Virgina Norfolk M.D.   On: 04/28/2021 19:07     LOS: 1 day   Antonieta Pert, MD [facility]  04/29/2021, 10:31 AM

## 2021-04-29 NOTE — Progress Notes (Signed)
Hypoglycemic Event  CBG: 62  Treatment: 8 oz juice/soda and pt is eating dinner and drinking ensure.  Symptoms:  Lethargy  Follow-up CBG: Time: 6:24 PM  CBG Result:121  Possible Reasons for Event: Inadequate meal intake, pt on clears until afternoon  Comments/MD notified: MD made aware    [name]

## 2021-04-29 NOTE — H&P (View-Only) (Signed)
Referring Provider: (Dr. Lupita Leash) [facility] Primary Care Physician:  [name], MD Primary Gastroenterologist: [facility]  Reason for Consultation:  Symptomatic anemia  HPI: [name] is a 75 y.o. male with past medical history noted below to include hepatic cirrhosis, HCC, history of GI bleed with (gastric AVMs in 2019, portal hypertensive gastropathy and grade 1 esophageal varices in 2021), type 1 isolated gastric varices, s/p TIPS 01/2020, GERD/Barrett's esophagus, CAD s/p CABG, HFpEF (EF 55-60% as of 07/2019) presenting for consultation of symptomatic anemia.    Patient presented to the ED yesterday with symptomatic anemia.  He reports recent worsening shortness of breath.  He denies melena or hematochezia, but per RN he has had 2 brown BMs mixed with red blood.  He denies any abdominal pain, nausea, vomiting, hematemesis, changes in appetite, or unexplained weight loss.  Denies ASA, NSAID, or blood thinner use.  No alcohol for the past 3-4 months.   Due to history of anemia and GI bleeding, he has had multiple prior GI procedures, noted below:  Last EGD 02/2021: Gastric varices, without bleeding. Portal hypertensive gastropathy. Tiny HH. Chronic gastritis.  EGD 12/2020: Small esophageal varices, acute gastritis, Type 1 isolated gastric varices (IGV1, varices located in the fundus), without bleeding. Portal hypertensive gastropathy.  Capsule endo 11/2020: No active bleeding, one or two non-bleeding vascular lesions noted in region of IC valve or proximal colon   EGD 10/2020: Minimal spontaneious oozing from body of stomach, possibly localized portal gastropathy or gastritis, without blood in stomach at start of procedure, therefore this finding is of uncertain significance. Grade I esophageal varices. Hemorrhagic gastropathy as described above. Type 1 isolated gastric varices (IGV1, varices located in the fundus), without bleeding.  Normal examined duodenum.   Last colonoscopy 11/20/20: No  definite source of patient's recurrent anemia seen on this study. Two diminutive polyps in the transverse colon, removed with a cold snare. Resected and retrieved. One 4 mm polyp in the distal rectum, removed with a cold snare. Resected and retrieved. Erythematous mucosa in the proximal ascending colon.  Past Medical History:  Diagnosis Date   Arthritis    "joints tighten up sometimes" (03/27/2104)   Carcinoma of liver, hepatocellular ([place]) 06/30/2020   Chronic lower back pain    Coronary artery disease involving native coronary artery of native heart without angina pectoris    Severe left main disease at catheterization July 2015  CABG x3 with a LIMA to the LAD, SVG to the OM, SVG to the PDA on 03/31/14. EF 60% by cath.    Diastolic heart failure (HCC)    GERD without esophagitis 08/04/2010   Hepatic cirrhosis ([place])    a. Dx 01/2014 - CT a/p    History of blood transfusion    "related to bleeding ulcers"   History of concussion    1976--  NO RESIDUAL   History of GI bleed    a. UGIB 07/2012;  b. 01/2014 admission with GIB/FOB stool req 1U prbc's->EGD showed portal gastropathy, barrett's esoph, and chronic active h. pylori gastritis.   History of gout    2007 &  2008  LEFT LEG-- NO ISSUE SINCE   Hyperlipidemia    Iron deficiency anemia    Kidney stones    OA (osteoarthritis of spine)    LOWER BACK--  INTERMITTANT LEFT LEG NUMBNESS   OSA (obstructive sleep apnea)    PULMOLOGIST-  DR CLANCE--  MODERATE OSA  STARTED CPAP 2012--  BUT CURRENTLY HAS NOT USED PAST 6 MONTHS  Phimosis    a. s/p circumcision 2015.   Type 2 diabetes mellitus ([place])    Unspecified essential hypertension     Past Surgical History:  Procedure Laterality Date   ANKLE FRACTURE SURGERY Right 1989   "plate put in"   APPENDECTOMY  05-16-2004   open   BIOPSY  01/08/2021   Procedure: BIOPSY;  Surgeon: Wilford Corner, MD;  Location: [facility];  Service: Endoscopy;;   CIRCUMCISION N/A 09/09/2013   Procedure:  CIRCUMCISION ADULT;  Surgeon: Bernestine Amass, MD;  [facility];  Service: Urology;  Laterality: N/A;   COLECTOMY  05-16-2004   COLONOSCOPY WITH PROPOFOL N/A 11/20/2020   Procedure: COLONOSCOPY WITH PROPOFOL;  Surgeon: Ronald Lobo, MD;  Location: [facility];  Service: Endoscopy;  Laterality: N/A;   CORONARY ANGIOPLASTY WITH STENT PLACEMENT  06/28/2012  DR COOPER   PCI W/  X1 DES to [place]. LAD/  LM  40% OSTIAL & 50-60% DISTAL /  50% PROX LCX/  30-40% PROX RCA & 50% MID RCA/   LVEF 65-70%   CORONARY ARTERY BYPASS GRAFT N/A 03/31/2014   Procedure: CORONARY ARTERY BYPASS GRAFTING (CABG) times 3 using left internal mammary artery and right saphenous vein.;  Surgeon: Melrose Nakayama, MD;  Location: [place];  Service: Open Heart Surgery;  Laterality: N/A;   DOBUTAMINE STRESS ECHO  06-08-2012   MODERATE HYPOKINESIS/ ISCHEMIA MID INFERIOR WALL   ESOPHAGOGASTRODUODENOSCOPY  08/15/2012   Procedure: ESOPHAGOGASTRODUODENOSCOPY (EGD);  Surgeon: Wonda Horner, MD;  Location: [facility]COPY;  Service: Endoscopy;  Laterality: N/A;   ESOPHAGOGASTRODUODENOSCOPY N/A 02/17/2014   Procedure: ESOPHAGOGASTRODUODENOSCOPY (EGD);  Surgeon: Jeryl Columbia, MD;  Location: [facility] END[facility]COPY;  Service: Endoscopy;  Laterality: N/A;   ESOPHAGOGASTRODUODENOSCOPY N/A 04/17/2020   Procedure: ESOPHAGOGASTRODUODENOSCOPY (EGD);  Surgeon: Ronnette Juniper, MD;  Location: [place];  Service: Gastroenterology;  Laterality: N/A;   ESOPHAGOGASTRODUODENOSCOPY N/A 09/11/2020   Procedure: ESOPHAGOGASTRODUODENOSCOPY (EGD);  Surgeon: Clarene Essex, MD;  Location: [facility];  Service: Endoscopy;  Laterality: N/A;   ESOPHAGOGASTRODUODENOSCOPY  10/09/2020   ESOPHAGOGASTRODUODENOSCOPY N/A 10/09/2020   Procedure: ESOPHAGOGASTRODUODENOSCOPY (EGD);  Surgeon: Otis Brace, MD;  Location[facility];  Service: Gastroenterology;  Laterality: N/A;   ESOPHAGOGASTRODUODENOSCOPY N/A 01/08/2021   Procedure: ESOPHAGOGASTRODUODENOSCOPY  (EGD);  Surgeon: Wilford Corner, MD;  Location: [facility];  Service: Endoscopy;  Laterality: N/A;   ESOPHAGOGASTRODUODENOSCOPY (EGD) WITH PROPOFOL N/A 09/30/2017   Procedure: ESOPHAGOGASTRODUODENOSCOPY (EGD) WITH PROPOFOL;  Surgeon: Wonda Horner, MD;  Location[facility];  Service: Endoscopy;  Laterality: N/A;   ESOPHAGOGASTRODUODENOSCOPY (EGD) WITH PROPOFOL N/A 12/20/2017   Procedure: ESOPHAGOGASTRODUODENOSCOPY (EGD) WITH PROPOFOL;  Surgeon: Clarene Essex, MD;  Location: [place];  Service: Endoscopy;  Laterality: N/A;   ESOPHAGOGASTRODUODENOSCOPY (EGD) WITH PROPOFOL N/A 08/10/2020   Procedure: ESOPHAGOGASTRODUODENOSCOPY (EGD) WITH PROPOFOL;  Surgeon: Wilford Corner, MD;  Location: [place];  Service: Endoscopy;  Laterality: N/A;   ESOPHAGOGASTRODUODENOSCOPY (EGD) WITH PROPOFOL N/A 11/20/2020   Procedure: ESOPHAGOGASTRODUODENOSCOPY (EGD) WITH PROPOFOL;  Surgeon: Ronald Lobo, MD;  [facility];  Service: Endoscopy;  Laterality: N/A;   ESOPHAGOGASTRODUODENOSCOPY (EGD) WITH PROPOFOL N/A 02/28/2021   Procedure: ESOPHAGOGASTRODUODENOSCOPY (EGD) WITH PROPOFOL;  Surgeon: Clarene Essex, MD;  [facility];  Service: Endoscopy;  Laterality: N/A;   GIVENS CAPSULE STUDY N/A 11/20/2020   Procedure: GIVENS CAPSULE STUDY;  Surgeon: Ronald Lobo, MD;  Location[facility];  Service: Endoscopy;  Laterality: N/A;   HERNIA REPAIR     INTRAOPERATIVE TRANSESOPHAGEAL ECHOCARDIOGRAM N/A 03/31/2014   Procedure: INTRAOPERATIVE TRANSESOPHAGEAL ECHOCARDIOGRAM;  Surgeon:  Melrose Nakayama, MD;  Location: [place];  Service: Open Heart Surgery;  Laterality: N/A;   IR ANGIOGRAM SELECTIVE EACH ADDITIONAL VESSEL  06/16/2020   IR ANGIOGRAM SELECTIVE EACH ADDITIONAL VESSEL  06/16/2020   IR ANGIOGRAM SELECTIVE EACH ADDITIONAL VESSEL  06/16/2020   IR ANGIOGRAM SELECTIVE EACH ADDITIONAL VESSEL  06/16/2020   IR ANGIOGRAM SELECTIVE EACH ADDITIONAL VESSEL  06/16/2020   IR ANGIOGRAM SELECTIVE EACH  ADDITIONAL VESSEL  06/16/2020   IR ANGIOGRAM SELECTIVE EACH ADDITIONAL VESSEL  06/16/2020   IR ANGIOGRAM SELECTIVE EACH ADDITIONAL VESSEL  06/16/2020   IR ANGIOGRAM SELECTIVE EACH ADDITIONAL VESSEL  06/16/2020   IR ANGIOGRAM SELECTIVE EACH ADDITIONAL VESSEL  06/30/2020   IR ANGIOGRAM SELECTIVE EACH ADDITIONAL VESSEL  06/30/2020   IR ANGIOGRAM VISCERAL SELECTIVE  06/16/2020   IR ANGIOGRAM VISCERAL SELECTIVE  06/30/2020   IR EMBO ARTERIAL NOT HEMORR HEMANG INC GUIDE ROADMAPPING  06/16/2020   IR EMBO TUMOR ORGAN ISCHEMIA INFARCT INC GUIDE ROADMAPPING  06/30/2020   IR EMBO VENOUS NOT HEMORR HEMANG  INC GUIDE ROADMAPPING  01/13/2021   IR IVUS EACH ADDITIONAL NON CORONARY VESSEL  01/13/2021   IR RADIOLOGIST EVAL & MGMT  05/06/2020   IR RADIOLOGIST EVAL & MGMT  05/20/2020   IR TIPS  01/13/2021   IR US GUIDE VASC ACCESS RIGHT  06/16/2020   IR US GUIDE VASC ACCESS RIGHT  06/30/2020   IR US GUIDE VASC ACCESS RIGHT  01/13/2021   LAPAROSCOPIC UMBILICAL HERNIA REPAIR W/ MESH  06-06-2011   LEFT HEART CATHETERIZATION WITH CORONARY ANGIOGRAM N/A 03/28/2014   Procedure: LEFT HEART CATHETERIZATION WITH CORONARY ANGIOGRAM;  Surgeon: Sinclair Grooms, MD;  Location: [facility] CATH LAB;  Service: Cardiovascular;  Laterality: N/A;   LIPOMA EXCISION Left 08/25/2017   Procedure: EXCISION LIPOMA LEFT POSTERIOR THIGH;  Surgeon: Clovis Riley, MD;  Location: [place];  Service: General;  Laterality: Left;   NEPHROLITHOTOMY  1990'S   OPEN APPENDECTOMY W/ PARTIAL CECECTOMY  05-16-2004   PERCUTANEOUS CORONARY STENT INTERVENTION (PCI-S) N/A 06/28/2012   Procedure: PERCUTANEOUS CORONARY STENT INTERVENTION (PCI-S);  Surgeon: Sherren Mocha, MD;  Location: [facility] CATH LAB;  Service: Cardiovascular;  Laterality: N/A;   POLYPECTOMY  11/20/2020   Procedure: POLYPECTOMY;  Surgeon: Ronald Lobo, MD;  [facility];  Service: Endoscopy;;   RADIOLOGY WITH ANESTHESIA N/A 01/13/2021   Procedure: IR WITH ANESTHESIA - TIPS;  Surgeon: Suzette Battiest, MD;  Location: [place];  Service: Radiology;  Laterality: N/A;    Prior to Admission medications   Medication Sig Start Date End Date Taking? Authorizing Provider  atorvastatin (LIPITOR) 40 MG tablet Take 40 mg by mouth daily.   Yes [provider]  Accu-Chek Softclix Lancets lancets USE AS DIRECTED TO TEST BLOOD SUGAR FOUR TIMES DAILY 03/13/20   [name], MD  albuterol (VENTOLIN HFA) 108 (90 Base) MCG/ACT inhaler Inhale 2 puffs into the lungs every 6 (six) hours as needed for wheezing or shortness of breath. 03/01/21   Aline August, MD  blood glucose meter kit and supplies Dispense based on patient and insurance preference. Use up to four times daily as directed. (FOR ICD-10 E10.9, E11.9). 03/12/20   [name], MD  furosemide (LASIX) 40 MG tablet TAKE 1 TABLET(40 MG) BY MOUTH DAILY Patient taking differently: Take 40 mg by mouth daily. 04/28/21   [name], MD  gabapentin (NEURONTIN) 100 MG capsule Take 100 mg by mouth 2 (two) times daily. 03/05/21   [provider]  lactulose (CHRONULAC) 10 GM/15ML solution Take 45 mLs (30 g total) by mouth 3 (three) times daily. 03/31/21   Debbe Odea, MD  metFORMIN (GLUCOPHAGE) 850 MG tablet TAKE 1 TABLET(850 MG) BY MOUTH TWICE DAILY WITH A MEAL Patient taking differently: Take 850 mg by mouth 2 (two) times daily with a meal. 01/08/21   [name], MD  nitroGLYCERIN (NITROSTAT) 0.4 MG SL tablet Place 1 tablet (0.4 mg total) under the tongue every 5 (five) minutes as needed for chest pain. 06/08/16   Nahser, Wonda Cheng, MD  pantoprazole (PROTONIX) 40 MG tablet Take 1 tablet (40 mg total) by mouth daily. 03/31/21   Debbe Odea, MD  rifaximin (XIFAXAN) 550 MG TABS tablet Take 1 tablet (550 mg total) by mouth 2 (two) times daily. 03/31/21   Debbe Odea, MD  spironolactone (ALDACTONE) 50 MG tablet Take 1 tablet (50 mg total) by mouth daily. 03/07/21   Ghimire, Henreitta Leber, MD  umeclidinium-vilanterol  (ANORO ELLIPTA) 62.5-25 MCG/INH AEPB Inhale 1 puff into the lungs daily. 03/12/20   [name], MD    Scheduled Meds:  furosemide  40 mg Intravenous Daily   insulin aspart  0-6 Units Subcutaneous Q6H   pantoprazole (PROTONIX) IV  40 mg Intravenous Q12H   umeclidinium-vilanterol  1 puff Inhalation Daily   Continuous Infusions:  cefTRIAXone (ROCEPHIN)  IV Stopped (04/28/21 2326)   PRN Meds:.acetaminophen **OR** acetaminophen  Allergies as of 04/28/2021 - Review Complete 04/28/2021  Allergen Reaction Noted   Fluzone quadrivalent [influenza vac split quad] Other (See Comments) 01/07/2021    Family History  Problem Relation Age of Onset   Lung cancer Sister    Cancer Sister        lung   Cancer Mother    Cancer Father        died in his 16s.   Cancer Brother        lung   Coronary artery disease Other    Diabetes Other    Colon cancer Other    Cancer Sister        lung    Social History   Socioeconomic History   Marital status: Married    Spouse name: Not on file   Number of children: Not on file   Years of education: Not on file   Highest education level: Not on file  Occupational History   Not on file  Tobacco Use   Smoking status: Former    Packs/day: 2.00    Years: 40.00    Pack years: 80.00    Types: Cigarettes    Quit date: 08/29/1996    Years since quitting: 24.6   Smokeless tobacco: Never  Vaping Use   Vaping Use: Never used  Substance and Sexual Activity   Alcohol use: Not Currently    Alcohol/week: 8.0 standard drinks    Types: 8 Cans of beer per week    Comment: 2 beers every other day   Drug use: No   Sexual activity: Yes  Other Topics Concern   Not on file  Social History Narrative   Lives in [place] with wife, son, and son's family.   Social Determinants of Health   Financial Resource Strain: Medium Risk   Difficulty of Paying Living Expenses: Somewhat hard  Food Insecurity: No Food Insecurity   Worried About Charity fundraiser  in the Last Year: Never true   Ran Out of Food in the Last Year: Never true  Transportation Needs: No Transportation Needs  Lack of Transportation (Medical): No   Lack of Transportation (Non-Medical): No  Physical Activity: Not on file  Stress: Not on file  Social Connections: Not on file  Intimate Partner Violence: Not on file    Review of Systems: Review of Systems  Constitutional:  Negative for fever and weight loss.  HENT:  Negative for hearing loss and tinnitus.   Eyes:  Negative for pain and redness.  Respiratory:  Positive for shortness of breath. Negative for cough.   Cardiovascular:  Negative for chest pain and palpitations.  Gastrointestinal:  Positive for blood in stool. Negative for abdominal pain, constipation, diarrhea, heartburn, melena, nausea and vomiting.  Genitourinary:  Negative for flank pain and hematuria.  Musculoskeletal:  Negative for falls and joint pain.  Skin:  Negative for itching and rash.  Neurological:  Negative for seizures and loss of consciousness.  Endo/Heme/Allergies:  Negative for polydipsia. Does not bruise/bleed easily.  Psychiatric/Behavioral:  Negative for substance abuse. The patient is not nervous/anxious.     Physical Exam: Vital signs: Vitals:   04/29/21 0916 04/29/21 1216  BP:  (!) 146/59  Pulse:  65  Resp:  20  Temp:    SpO2: 98% 100%     Physical Exam Vitals reviewed.  Constitutional:      General: He is not in acute distress.    Appearance: He is obese. He is ill-appearing.  HENT:     Head: Normocephalic and atraumatic.     Nose: Nose normal. No congestion.     Mouth/Throat:     Mouth: Mucous membranes are moist.     Pharynx: Oropharynx is clear.  Eyes:     General: Scleral icterus present.     Extraocular Movements: Extraocular movements intact.     Comments: Conjunctival pallor  Cardiovascular:     Rate and Rhythm: Normal rate and regular rhythm.  Pulmonary:     Effort: Pulmonary effort is normal. No  respiratory distress.  Abdominal:     General: Bowel sounds are normal. There is distension.     Palpations: Abdomen is soft. There is no mass.     Tenderness: There is no abdominal tenderness. There is no guarding or rebound.  Musculoskeletal:     Cervical back: Normal range of motion and neck supple.     Right lower leg: Edema present.     Left lower leg: Edema present.  Skin:    General: Skin is warm and dry.     Coloration: Skin is pale. Skin is not jaundiced.  Neurological:     General: No focal deficit present.     Mental Status: He is alert and oriented to person, place, and time.  Psychiatric:        Mood and Affect: Mood normal.        Behavior: Behavior normal.    GI:  Lab Results: Recent Labs    04/28/21 1843 04/29/21 0450 04/29/21 0903  WBC 2.2* 2.1*  --   HGB 5.9* 7.2* 7.3*  HCT 20.8* 24.5* 24.2*  PLT 107* 100*  --    BMET Recent Labs    04/28/21 1843 04/29/21 0450  NA 144 143  K 4.1 3.6  CL 119* 114*  CO2 20* 22  GLUCOSE 96 73  BUN 14 14  CREATININE 0.93 0.74  CALCIUM 8.9 8.9   LFT Recent Labs    04/29/21 0450  PROT 7.0  ALBUMIN 3.3*  AST 26  ALT 21  ALKPHOS 118  BILITOT 1.3*   PT/INR  Recent Labs    04/29/21 0450  LABPROT 16.3*  INR 1.3*     Studies/Results: DG Chest 2 View  Result Date: 04/28/2021 CLINICAL DATA:  Dyspnea upon exertion. EXAM: CHEST - 2 VIEW COMPARISON:  March 28, 2021 FINDINGS: Multiple sternal wires and vascular clips are seen. Moderate severity diffusely increased interstitial lung markings are seen with associated prominence of the pulmonary vasculature. There is no evidence of a pleural effusion or pneumothorax. The cardiac silhouette is mildly enlarged and unchanged in size. There is marked severity calcification of the aortic arch. The visualized skeletal structures are unremarkable. IMPRESSION: Findings consistent with moderate severity congestive heart failure. Electronically Signed   By: Virgina Norfolk M.D.    On: 04/28/2021 19:07    Impression: Acute on chronic iron deficiency anemia: -Now having reddish stools, concern for bleeding from cecal AVM vs. Upper GI source -Hgb 7.2, improved from 5.9 yesterday after 2u pRBCs -Normal BUN 14 and Cr 0.74 -Ferritin 11 -Iron 19, saturation 4%  Alcohol-related cirrhosis, not currently consuming alcohol; MELD score of 10 (6% estimated 74-monthmortality) as of 04/29/21 -INR 1.3 -T. Bili 1.3, otherwise normal LFTS (AST 26/ ALT 21/ ALP 118) -Platelets 100K/uL  History of TIPS in 6/20220 due to recurrent upper GI bleeding due to gastric varices and portal hypertensive gastropathy; [facility]  GERD/Barrett's esophagus  CAD s/p CABG  HFpEF (EF 55-60% as of 07/2019)  Plan: There is no current anesthesia availability for procedures tomorrow 9/2 and patient is currently stable.  Thus, plan for EGD and colonoscopy on Saturday 9/3. I thoroughly discussed the procedures with the patient to include nature, alternatives, benefits, and risks (including but not limited to bleeding, infection, perforation, anesthesia/cardiac and pulmonary complications). Patient verbalized understanding and gave verbal consent to proceed with EGD and colonoscopy.  Per patient's request, I also updated his son JLarico Dimock  Soft diet OK today with clear liquid diet starting tomorrow Friday 9/2.  Plan for NuLYTELY prep starting tomorrow.  Continue Rocephin 2g IV daily for SBP prophylaxis in the setting of cirrhosis with GI bleeding.  Continue Protonix 40 mg BID.  Continue to monitor H&H with transfusion as needed to maintain Hgb >7.  [facility] will follow.    LOS: 1 day   ASalley Slaughter PA-C 04/29/2021, 12:56 PM  Contact #  [phone]

## 2021-04-30 LAB — BASIC METABOLIC PANEL
Anion gap: 5 (ref 5–15)
BUN: 16 mg/dL (ref 8–23)
CO2: 23 mmol/L (ref 22–32)
Calcium: 8.1 mg/dL — ABNORMAL LOW (ref 8.9–10.3)
Chloride: 105 mmol/L (ref 98–111)
Creatinine, Ser: 1.01 mg/dL (ref 0.61–1.24)
GFR, Estimated: >60 mL/min (ref >60)
Glucose, Bld: 119 mg/dL — ABNORMAL HIGH (ref 70–99)
Potassium: 4.2 mmol/L (ref 3.5–5.1)
Sodium: 133 mmol/L — ABNORMAL LOW (ref 135–145)

## 2021-04-30 LAB — GLUCOSE, CAPILLARY
Glucose-Capillary: 101 mg/dL — ABNORMAL HIGH (ref 70–99)
Glucose-Capillary: 132 mg/dL — ABNORMAL HIGH (ref 70–99)
Glucose-Capillary: 75 mg/dL (ref 70–99)
Glucose-Capillary: 85 mg/dL (ref 70–99)
Glucose-Capillary: 94 mg/dL (ref 70–99)
Glucose-Capillary: 97 mg/dL (ref 70–99)

## 2021-04-30 LAB — CBC
HCT: 23.3 % — ABNORMAL LOW (ref 39.0–52.0)
Hemoglobin: 7 g/dL — ABNORMAL LOW (ref 13.0–17.0)
MCH: 23.6 pg — ABNORMAL LOW (ref 26.0–34.0)
MCHC: 30 g/dL (ref 30.0–36.0)
MCV: 78.5 fL — ABNORMAL LOW (ref 80.0–100.0)
Platelets: 83 10*3/uL — ABNORMAL LOW (ref 150–400)
RBC: 2.97 MIL/uL — ABNORMAL LOW (ref 4.22–5.81)
RDW: 19.6 % — ABNORMAL HIGH (ref 11.5–15.5)
WBC: 2.3 10*3/uL — ABNORMAL LOW (ref 4.0–10.5)
nRBC: 0 % (ref 0.0–0.2)

## 2021-04-30 LAB — PREPARE RBC (CROSSMATCH)

## 2021-04-30 MED ORDER — SODIUM CHLORIDE 0.9% IV SOLUTION: Freq: Once | INTRAVENOUS | Status: AC

## 2021-04-30 MED ORDER — FUROSEMIDE 10 MG/ML IJ SOLN
20.0000 mg | Freq: Once | INTRAMUSCULAR | Status: AC
  Administered 2021-04-30: 20 mg via INTRAVENOUS
  Filled 2021-04-30: qty 2

## 2021-04-30 NOTE — Progress Notes (Signed)
[facility] Gastroenterology Progress Note  [name] 75 y.o. 03/08/1946  CC:  Symptomatic Anemia   Subjective: Patient had maroon colored bowel movement yesterday. Nurse reports no bowel movement overnight or today. Patient denies abdominal pain, nausea, vomiting. Patient states he is feeling hungry, tolerating clear liquids  ROS : Review of Systems  Cardiovascular:  Negative for chest pain and palpitations.  Gastrointestinal:  Positive for blood in stool. Negative for abdominal pain, constipation, diarrhea, heartburn, melena, nausea and vomiting.     Objective: Vital signs in last 24 hours: Vitals:   04/30/21 0559 04/30/21 0759  BP: (!) 125/58   Pulse: (!) 59   Resp: 20   Temp: 98.4 F (36.9 C)   SpO2: 99% 97%    Physical Exam:  General:  Alert, cooperative, no distress,  Head:  Normocephalic, without obvious abnormality, atraumatic  Eyes:  Anicteric sclera, conjunctival pallor, EOM's intact  Lungs:   Clear to auscultation bilaterally, respirations unlabored  Heart:  Regular rate and rhythm, S1, S2 normal  Abdomen:   Soft, non-tender, bowel sounds active all four quadrants,  no masses,     Lab Results: Recent Labs    04/29/21 0450 04/30/21 0413  NA 143 133*  K 3.6 4.2  CL 114* 105  CO2 22 23  GLUCOSE 73 119*  BUN 14 16  CREATININE 0.74 1.01  CALCIUM 8.9 8.1*  MG 2.0  --   PHOS 3.4  --    Recent Labs    04/28/21 1843 04/29/21 0450  AST 29 26  ALT 21 21  ALKPHOS 127* 118  BILITOT 1.2 1.3*  PROT 6.9 7.0  ALBUMIN 3.2* 3.3*   Recent Labs    04/28/21 1843 04/29/21 0450 04/29/21 0903 04/29/21 2146 04/30/21 0413  WBC 2.2* 2.1*  --   --  2.3*  NEUTROABS 1.4*  --   --   --   --   HGB 5.9* 7.2*   < > 7.1* 7.0*  HCT 20.8* 24.5*   < > 23.5* 23.3*  MCV 78.2* 79.8*  --   --  78.5*  PLT 107* 100*  --   --  83*   < > = values in this interval not displayed.   Recent Labs    04/29/21 0450  LABPROT 16.3*  INR 1.3*      Assessment Symptomatic  Anemia Now having reddish stools, concern for bleeding from cecal AVM vs. Upper GI source -Hgb 7.0, (slightly decreased from yesterday 7.2) -Normal BUN 16 and Cr 1.01 -Ferritin 11 -Iron 19, saturation 4%   Alcohol-related cirrhosis, not currently consuming alcohol; MELD score of 10 (6% estimated 16-month mortality) as of 04/29/21 -INR 1.3, as of 9/1 -T. Bili 1.3, otherwise normal LFTS (AST 26/ ALT 21/ ALP 118) as of 9/1 -Platelets 83 K/uL (downward trending as of yesterday 100K/uL)   History of TIPS in 6/20220 due to recurrent upper GI bleeding due to gastric varices and portal hypertensive gastropathy; history of gastric AVMs in    [facility] s/p IR embolization   GERD/Barrett's esophagus   CAD s/p CABG   HFpEF (EF 55-60% as of 07/2019)   Plan: Plan for EGD/Colonoscopy tomorrow.  Clear liquids, will start Nulytely at 1100 today. NPO at midnight.  Continue Rocephin 2g IV daily for SBP prophylaxis in the setting of cirrhosis with GI bleeding.   Continue Protonix 40 mg BID.   Agree with transfusion, continue to monitor H&H with transfusion as needed to maintain Hgb >7.   [facility] will  follow.   Garnette Scheuermann PA-C 04/30/2021, 9:53 AM  Contact #  [phone]

## 2021-04-30 NOTE — Plan of Care (Signed)
  Problem: Clinical Measurements: Goal: Will remain free from infection Outcome: Progressing Goal: Diagnostic test results will improve Outcome: Progressing   Problem: Safety: Goal: Ability to remain free from injury will improve Outcome: Progressing

## 2021-04-30 NOTE — Progress Notes (Signed)
PROGRESS NOTE    [name]  BJS:[id number] DOB: 1945/09/17 DOA: 04/28/2021 PCP: [name], MD   Chief Complaint  Patient presents with   Abdominal Distention   Shortness of Breath   Brief Narrative: 75 year old male with chronic anemia Baseline hemoglobin 7-8.5 g recurrent GI bleed, liver cirrhosis, chronic diastolic CHF, Y0VP, history of pancytopenia, recent WBC count 2.3 on 7/31 COPD presented to the ED with shortness of breath for 3 to 4 days progressively worse with orthopnea mild increase in edema lower leg, no chest pain. He has history of liver cirrhosis with recurrent upper GI bleed most recent EGD 7/3 by Dr. Driscilla Grammes no evidence of active or recent bleed, no esophageal varices, mild portal hypertensive gastropathy, small hiatal hernia, gastric varices without evidence of bleeding, chronic gastritis was noted.  In the ED hemoglobin 5.9 g with leukopenia to 200 platelet 107.  Chest x-ray showed moderate severity congestive heart failure, bnp at 360, 2 units prbc transfusion was ordered along with IV Lasix and patient was admitted.Lab work showed iron deficiency anemia ferritin 11 iron 19. H&H posttransfusion improved to 7.2 g.  Fecal occult blood was positive without evidence of gross blood.  Subjective:  Feels better this am Last BM yesterday Hh down to 7.0 Leg edema ans shortness of breath feels much better  Assessment & Plan:  Acute on chronic anemia Symptomatic anemia: History of recurrent GI bleed FOBT positive stool.  Units PRBC in the ED hemoglobin up.  Repeat H&H.  Labs showed iron deficiency ferritin at 11. HH dropped to 7/.0 he agrees for 1 more transfusion with iv lasix. Recent Labs  Lab 04/28/21 1843 04/29/21 0450 04/29/21 0903 04/29/21 2146 04/30/21 0413  HGB 5.9* 7.2* 7.3* 7.1* 7.0*  HCT 20.8* 24.5* 24.2* 23.5* 23.3*   FOBT positive stool-no overt acute GI bleeding History of recurrent GI bleed Blackish stool at home Concern for bleeding  from cecal AVM versus upper GI source He has history of liver cirrhosis with recurrent upper GI bleed most recent EGD 7/3 by Dr. Driscilla Grammes no evidence of active or recent bleed, no esophageal varices, mild portal hypertensive gastropathy, small hiatal hernia, gastric varices without evidence of bleeding, chronic gastritis was noted.  He endorses black his stool at home.  FOBT positive in ED, h/h low on admission, apprecaite GI inputs for EGD/COLO on Saturday. Cont PPI BID  Liver cirrhosis chronic:suspected to be alcohol related with portal hypertension history of hepatic encephalopathy, with ongoing pancytopenia. At home on Lasix, Aldactone, lactulose.  INR is stable.  Continue home meds  Acute on chronic diastolic CHF: Congested chest x-ray BNP elevated. He has had progressive shortness of breath likely in the setting of anemia as well as CHF.  Has chronic leg edema -repeat chest x-ray 9/1- improving aeration. cont IV Lasix  daily, give extra Lasix during transfusion,monitor intake output strictly, daily weight, monitor electrolytes.  Filed Weights   04/28/21 1825 04/29/21 0839 04/30/21 0500  Weight: 108.9 kg 99.2 kg 100.5 kg  Net IO Since Admission: -4,465 mL [04/30/21 0945]   Intake/Output Summary (Last 24 hours) at 04/30/2021 0945 Last data filed at 04/30/2021 0600 Gross per 24 hour  Intake 1060 ml  Output 1250 ml  Net -190 ml     Pancytopenia-with leukopenia thrombocytopenia.  In the setting of liver cirrhosis, chronic problems.  H&H borderline transfusing 1 unit PRBC.  Platelet count low and also with leukopenia we will monitor Recent Labs  Lab 04/28/21 1843 04/29/21 0450 04/29/21 0903 04/29/21  2146 04/30/21 0413  HGB 5.9* 7.2* 7.3* 7.1* 7.0*  HCT 20.8* 24.5* 24.2* 23.5* 23.3*  WBC 2.2* 2.1*  --   --  2.3*  PLT 107* 100*  --   --  83*    Asymptomatic pyuria 11-20.  Monitor  Type 2 diabetes mellitus with hypoglycemia not on insulin, on metformin at home, holding for now.  Sliding  scale and monitor cbg 62> 84,.  He had hypoglycemia yesterday blood sugar stable today Recent Labs  Lab 04/29/21 1823 04/29/21 2046 04/30/21 0025 04/30/21 0551 04/30/21 0750  GLUCAP 121* 135* 97 101* 75     COPD/former smoker: Stable respiratory status.  Continue his home inhaler  Class I obesity with BMI 33: will benefit with healthy Lifestyle,PCP follow-up Will benefit with weight Diet Order             Diet NPO time specified  Diet effective midnight           Diet clear liquid Room service appropriate? Yes; Fluid consistency: Thin  Diet effective midnight                 Patient's Body mass index is 33.69 kg/m. DVT prophylaxis: SCDs Start: 04/29/21 0349 SCDs Start: 04/28/21 2158 Code Status:   Code Status: Full Code  Family Communication: plan of care discussed with patient at bedside.  Status is: Inpatient Remains inpatient appropriate because:Ongoing diagnostic testing needed not appropriate for outpatient work up and Inpatient level of care appropriate due to severity of illness Dispo: The patient is from: Home              Anticipated d/c is to: Home              Patient currently is not medically stable to d/c.   Difficult to place patient No  Unresulted Labs (From admission, onward)     Start     Ordered   04/30/21 0500  CBC  Daily,   R     Question:  Specimen collection method  Answer:  Lab=Lab collect   04/29/21 1047   04/30/21 5009  Basic metabolic panel  Daily,   R     Question:  Specimen collection method  Answer:  Lab=Lab collect   04/29/21 1047   04/28/21 2214  Methylmalonic acid, serum  Add-on,   AD        04/28/21 2213            Medications reviewed:  Scheduled Meds:  (feeding supplement) PROSource Plus  30 mL Oral BID BM   feeding supplement  237 mL Oral Q24H   furosemide  40 mg Intravenous Daily   insulin aspart  0-6 Units Subcutaneous Q6H   multivitamin with minerals  1 tablet Oral Daily   pantoprazole (PROTONIX) IV  40 mg  Intravenous Q12H   polyethylene glycol-electrolytes  4,000 mL Oral Once   umeclidinium-vilanterol  1 puff Inhalation Daily   Continuous Infusions:  cefTRIAXone (ROCEPHIN)  IV 2 g (04/29/21 2245)   Consultants:see note  Procedures:see note Antimicrobials: Anti-infectives (From admission, onward)    Start     Dose/Rate Route Frequency Ordered Stop   04/28/21 2300  cefTRIAXone (ROCEPHIN) 2 g in sodium chloride 0.9 % 100 mL IVPB        2 g 200 mL/hr over 30 Minutes Intravenous Every 24 hours 04/28/21 2238        Culture/Microbiology    Component Value Date/Time   SDES BLOOD SITE NOT SPECIFIED 01/27/2021 1748  SDES BLOOD SITE NOT SPECIFIED 01/27/2021 1748   SPECREQUEST  01/27/2021 1748    BOTTLES DRAWN AEROBIC AND ANAEROBIC Blood Culture adequate volume   SPECREQUEST  01/27/2021 1748    BOTTLES DRAWN AEROBIC AND ANAEROBIC Blood Culture adequate volume   CULT  01/27/2021 1748    NO GROWTH 5 DAYS Performed at [facility], [place] [address], [place], [place] [address]    CULT  01/27/2021 1748    NO GROWTH 5 DAYS Performed at [place] [address], [place], [place] [address]    REPTSTATUS 02/01/2021 FINAL 01/27/2021 1748   REPTSTATUS 02/01/2021 FINAL 01/27/2021 1748    Other culture-see note  Objective: Vitals: Today's Vitals   04/29/21 2313 04/30/21 0500 04/30/21 0559 04/30/21 0759  BP:   (!) 125/58   Pulse:   (!) 59   Resp:   20   Temp:   98.4 F (36.9 C)   TempSrc:   Oral   SpO2:   99% 97%  Weight:  100.5 kg    Height:      PainSc: 0-No pain       Intake/Output Summary (Last 24 hours) at 04/30/2021 0945 Last data filed at 04/30/2021 0600 Gross per 24 hour  Intake 1060 ml  Output 1250 ml  Net -190 ml    Filed Weights   04/28/21 1825 04/29/21 0839 04/30/21 0500  Weight: 108.9 kg 99.2 kg 100.5 kg   Weight change: -9.663 kg  Intake/Output from previous day: 09/01 0701 - 09/02 0700 In: 1060 [P.O.:960; IV Piggyback:100] Out: 1770  [Urine:1770] Intake/Output this shift: No intake/output data recorded. Filed Weights   04/28/21 1825 04/29/21 0839 04/30/21 0500  Weight: 108.9 kg 99.2 kg 100.5 kg   Examination: General exam: AAOx 3, pleasant, obese, not in distress.  HEENT:Oral mucosa moist, Ear/Nose WNL grossly, dentition normal. Respiratory system: bilaterally diminished, no use of accessory muscle Cardiovascular system: S1 & S2 +, No JVD,. Gastrointestinal system: Abdomen soft, NT,ND, BS+ Nervous System:Alert, awake, moving extremities and grossly nonfocal Extremities: LE edema++, distal peripheral pulses palpable.  Skin: No rashes,no icterus. MSK: Normal muscle bulk,tone, power   Data Reviewed: I have personally reviewed following labs and imaging studies CBC: Recent Labs  Lab 04/28/21 1843 04/29/21 0450 04/29/21 0903 04/29/21 2146 04/30/21 0413  WBC 2.2* 2.1*  --   --  2.3*  NEUTROABS 1.4*  --   --   --   --   HGB 5.9* 7.2* 7.3* 7.1* 7.0*  HCT 20.8* 24.5* 24.2* 23.5* 23.3*  MCV 78.2* 79.8*  --   --  78.5*  PLT 107* 100*  --   --  83*    Basic Metabolic Panel: Recent Labs  Lab 04/28/21 1843 04/29/21 0450 04/30/21 0413  NA 144 143 133*  K 4.1 3.6 4.2  CL 119* 114* 105  CO2 20* 22 23  GLUCOSE 96 73 119*  BUN 14 14 16   CREATININE 0.93 0.74 1.01  CALCIUM 8.9 8.9 8.1*  MG  --  2.0  --   PHOS  --  3.4  --     GFR: Estimated Creatinine Clearance: 72.6 mL/min (by C-G formula based on SCr of 1.01 mg/dL). Liver Function Tests: Recent Labs  Lab 04/28/21 1843 04/29/21 0450  AST 29 26  ALT 21 21  ALKPHOS 127* 118  BILITOT 1.2 1.3*  PROT 6.9 7.0  ALBUMIN 3.2* 3.3*    No results for input(s): LIPASE, AMYLASE in the last 168 hours. Recent Labs  Lab 04/28/21 1843  AMMONIA 93*    Coagulation Profile: Recent Labs  Lab 04/29/21 0450  INR 1.3*    Cardiac Enzymes: No results for input(s): CKTOTAL, CKMB, CKMBINDEX, TROPONINI in the last 168 hours. BNP (last 3 results) No results for  input(s): PROBNP in the last 8760 hours. HbA1C: No results for input(s): HGBA1C in the last 72 hours. CBG: Recent Labs  Lab 04/29/21 1823 04/29/21 2046 04/30/21 0025 04/30/21 0551 04/30/21 0750  GLUCAP 121* 135* 97 101* 75    Lipid Profile: No results for input(s): CHOL, HDL, LDLCALC, TRIG, CHOLHDL, LDLDIRECT in the last 72 hours. Thyroid Function Tests: No results for input(s): TSH, T4TOTAL, FREET4, T3FREE, THYROIDAB in the last 72 hours. Anemia Panel: Recent Labs    04/29/21 0450 04/29/21 0903  FOLATE  --  12.9  FERRITIN  --  11*  TIBC  --  442  IRON  --  19*  RETICCTPCT 0.9  --     Sepsis Labs: No results for input(s): PROCALCITON, LATICACIDVEN in the last 168 hours.  Recent Results (from the past 240 hour(s))  SARS CORONAVIRUS 2 (TAT 6-24 HRS) Nasopharyngeal Nasopharyngeal Swab     Status: None   Collection Time: 04/28/21  9:27 PM   Specimen: Nasopharyngeal Swab  Result Value Ref Range Status   SARS Coronavirus 2 NEGATIVE NEGATIVE Final    Comment: (NOTE) SARS-CoV-2 target nucleic acids are NOT DETECTED.  The SARS-CoV-2 RNA is generally detectable in upper and lower respiratory specimens during the acute phase of infection. Negative results do not preclude SARS-CoV-2 infection, do not rule out co-infections with other pathogens, and should not be used as the sole basis for treatment or other patient management decisions. Negative results must be combined with clinical observations, patient history, and epidemiological information. The expected result is Negative.  Fact Sheet for Patients: SugarRoll.be  Fact Sheet for Healthcare Providers: https://www.woods-mathews.com/  This test is not yet approved or cleared by the [place] FDA and  has been authorized for detection and/or diagnosis of SARS-CoV-2 by FDA under an Emergency Use Authorization (EUA). This EUA will remain  in effect (meaning this test can be  used) for the duration of the COVID-19 declaration under Se ction 564(b)(1) of the Act, 21 U.S.C. section 360bbb-3(b)(1), unless the authorization is terminated or revoked sooner.  Performed at [facility], [place] [address] [place], [place] [address]      Radiology Studies: DG Chest 2 View  Result Date: 04/28/2021 CLINICAL DATA:  Dyspnea upon exertion. EXAM: CHEST - 2 VIEW COMPARISON:  March 28, 2021 FINDINGS: Multiple sternal wires and vascular clips are seen. Moderate severity diffusely increased interstitial lung markings are seen with associated prominence of the pulmonary vasculature. There is no evidence of a pleural effusion or pneumothorax. The cardiac silhouette is mildly enlarged and unchanged in size. There is marked severity calcification of the aortic arch. The visualized skeletal structures are unremarkable. IMPRESSION: Findings consistent with moderate severity congestive heart failure. Electronically Signed   By: Virgina Norfolk M.D.   On: 04/28/2021 19:07   DG Chest Port 1 View  Result Date: 04/29/2021 CLINICAL DATA:  Shortness of breath EXAM: PORTABLE CHEST 1 VIEW COMPARISON:  04/28/2021 FINDINGS: Post CABG changes. Stable cardiomegaly. Atherosclerotic calcification of the aortic knob. Pulmonary vascular congestion. Slightly improving aeration of the lung bases. No new focal consolidation. No pleural effusion or pneumothorax. IMPRESSION: 1. Cardiomegaly with pulmonary vascular congestion. 2. Slightly improving aeration of the lung bases. Electronically Signed  By: Davina Poke D.O.   On: 04/29/2021 14:12     LOS: 2 days   Antonieta Pert, MD [facility]  04/30/2021, 9:45 AM

## 2021-04-30 NOTE — Consult Note (Signed)
[facility] Inpatient Consult   04/30/2021  [name] [birth year] [id number]  [facility] [[facility]] Eligible   Patient chart has been reviewed for readmissions less than 30 days and for extrme high risk score for unplanned readmissions.  Patient assessed for community [place] [facility] CM) post hospital transition of care needs.  Chart review reveals patient primary provider office offers care coordination services. Three unsuccessful attempts to contact patient have been made.   Plan: Will continue to follow for progression and disposition.  Of note, [facility] Care Management services does not replace or interfere with any services that are arranged by inpatient case management or social work.   Netta Cedars, MSN, [facility] Liaison Nurse Mobile Phone [phone]  Toll free office [phone]

## 2021-05-01 LAB — GLUCOSE, CAPILLARY
Glucose-Capillary: 105 mg/dL — ABNORMAL HIGH (ref 70–99)
Glucose-Capillary: 148 mg/dL — ABNORMAL HIGH (ref 70–99)

## 2021-05-01 LAB — BASIC METABOLIC PANEL
Anion gap: 7 (ref 5–15)
BUN: 14 mg/dL (ref 8–23)
CO2: 21 mmol/L — ABNORMAL LOW (ref 22–32)
Calcium: 8.1 mg/dL — ABNORMAL LOW (ref 8.9–10.3)
Chloride: 105 mmol/L (ref 98–111)
Creatinine, Ser: 0.85 mg/dL (ref 0.61–1.24)
GFR, Estimated: >60 mL/min (ref >60)
Glucose, Bld: 73 mg/dL (ref 70–99)
Potassium: 3.8 mmol/L (ref 3.5–5.1)
Sodium: 133 mmol/L — ABNORMAL LOW (ref 135–145)

## 2021-05-01 LAB — CBC
HCT: 27.6 % — ABNORMAL LOW (ref 39.0–52.0)
Hemoglobin: 8.4 g/dL — ABNORMAL LOW (ref 13.0–17.0)
MCH: 24.3 pg — ABNORMAL LOW (ref 26.0–34.0)
MCHC: 30.4 g/dL (ref 30.0–36.0)
MCV: 79.8 fL — ABNORMAL LOW (ref 80.0–100.0)
Platelets: 76 10*3/uL — ABNORMAL LOW (ref 150–400)
RBC: 3.46 MIL/uL — ABNORMAL LOW (ref 4.22–5.81)
RDW: 19.7 % — ABNORMAL HIGH (ref 11.5–15.5)
WBC: 2.2 10*3/uL — ABNORMAL LOW (ref 4.0–10.5)
nRBC: 0 % (ref 0.0–0.2)

## 2021-05-01 NOTE — Progress Notes (Signed)
PROGRESS NOTE    [name]  SAY:[id number] DOB: [birth year] DOA: 04/28/2021 PCP: [name], MD   Chief Complaint  Patient presents with   Abdominal Distention   Shortness of Breath   Brief Narrative: 75 year old male with chronic anemia Baseline hemoglobin 7-8.5 g recurrent GI bleed, liver cirrhosis, chronic diastolic CHF, A3FT, history of pancytopenia, recent WBC count 2.3 on 7/31 COPD presented to the ED with shortness of breath for 3 to 4 days progressively worse with orthopnea mild increase in edema lower leg, no chest pain. He has history of liver cirrhosis with recurrent upper GI bleed most recent EGD 7/3 by Dr. Driscilla Grammes no evidence of active or recent bleed, no esophageal varices, mild portal hypertensive gastropathy, small hiatal hernia, gastric varices without evidence of bleeding, chronic gastritis was noted.  In the ED hemoglobin 5.9 g with leukopenia to 200 platelet 107.  Chest x-ray showed moderate severity congestive heart failure, bnp at 360, 2 units prbc transfusion was ordered along with IV Lasix and patient was admitted.Lab work showed iron deficiency anemia ferritin 11 iron 19. H&H posttransfusion improved to 7.2 g.  Fecal occult blood was positive without evidence of gross blood.  Subjective: No new complaints.  Drinking his prep.  Patient reports they have since procedure for tomorrow morning. His leg swelling is much better  Assessment & Plan:  Acute on chronic anemia Symptomatic anemia: History of recurrent GI bleed FOBT positive stool.  Units PRBC in the ED hemoglobin up.  Repeat H&H.  Labs showed iron deficiency ferritin at 11. HH dropped to 7/-given additional 1 unit PRBC and appropriately increased to 8.4 grams.   Recent Labs  Lab 04/29/21 0450 04/29/21 0903 04/29/21 2146 04/30/21 0413 05/01/21 0407  HGB 7.2* 7.3* 7.1* 7.0* 8.4*  HCT 24.5* 24.2* 23.5* 23.3* 27.6*   FOBT positive stool-no overt acute GI bleeding History of recurrent GI  bleed Blackish stool at home Concern for bleeding from cecal AVM versus upper GI source He has history of liver cirrhosis with recurrent upper GI bleed most recent EGD 7/3 by Dr. Driscilla Grammes no evidence of active or recent bleed, no esophageal varices, mild portal hypertensive gastropathy, small hiatal hernia, gastric varices without evidence of bleeding, chronic gastritis was noted.  He endorses black his stool at home.  FOBT positive in ED, h/h low on admission, apprecaite GI inputs for EGD/COLO tomorrow if platelet count is stable.    Liver cirrhosis chronic:suspected to be alcohol related with portal hypertension history of hepatic encephalopathy, with ongoing pancytopenia. At home on Lasix, Aldactone, lactulose.  INR is stable.  Continue current meds with iv diuresis  Acute on chronic diastolic CHF: Congested chest x-ray BNP elevated. He has had progressive shortness of breath likely in the setting of anemia as well as CHF.  Has chronic leg edema -repeat chest x-ray 9/1- improving aeration.  Leg edema much better weight is improving nicely we will continue IV Lasix 40 mg daily, strict I and O, daily weight  Filed Weights   04/29/21 0839 04/30/21 0500 05/01/21 0507  Weight: 99.2 kg 100.5 kg 98.8 kg  Net IO Since Admission: -5,085 mL [05/01/21 1128]   Intake/Output Summary (Last 24 hours) at 05/01/2021 1128 Last data filed at 04/30/2021 2322 Gross per 24 hour  Intake 1050 ml  Output 1500 ml  Net -450 ml     Pancytopenia-with leukopenia thrombocytopenia and anemia in the setting of liver cirrhosis, chronic problems. S/p 1 unit PRBC 9/2.  Platelet count low-slightly dropped this  morning downtrending.  Repeat in a.m. if further drop GI planning for transfusion await am lab Recent Labs  Lab 04/29/21 0450 04/29/21 0903 04/29/21 2146 04/30/21 0413 05/01/21 0407  HGB 7.2*   < > 7.1* 7.0* 8.4*  HCT 24.5*   < > 23.5* 23.3* 27.6*  WBC 2.1*  --   --  2.3* 2.2*  PLT 100*  --   --  83* 76*   < > =  values in this interval not displayed.     Asymptomatic pyuria 11-20.  Monitor  Type 2 diabetes mellitus with hypoglycemia not on insulin, on metformin at home, holding for now.  Sugar overall stable on sliding scale.  No more hypoglycemia.   Recent Labs  Lab 04/30/21 0750 04/30/21 1204 04/30/21 1752 04/30/21 2314 05/01/21 1121  GLUCAP 75 132* 94 85 105*     COPD/former smoker: Stable respiratory status.  Continue his home inhaler  Class I obesity with BMI 33: will benefit with healthy Lifestyle,PCP follow-up Will benefit with weight Diet Order             Diet NPO time specified  Diet effective midnight           Diet clear liquid Room service appropriate? Yes; Fluid consistency: Thin  Diet effective now                 Patient's Body mass index is 33.13 kg/m. DVT prophylaxis: SCDs Start: 04/29/21 0349 SCDs Start: 04/28/21 2158 Code Status:   Code Status: Full Code  Family Communication: plan of care discussed with patient at bedside.  Status is: Inpatient Remains inpatient appropriate because:Ongoing diagnostic testing needed not appropriate for outpatient work up and Inpatient level of care appropriate due to severity of illness Dispo: The patient is from: Home              Anticipated d/c is to: Home              Patient currently is not medically stable to d/c.   Difficult to place patient No  Unresulted Labs (From admission, onward)     Start     Ordered   04/30/21 0500  CBC  Daily,   R     Question:  Specimen collection method  Answer:  Lab=Lab collect   04/29/21 1047   04/30/21 3299  Basic metabolic panel  Daily,   R     Question:  Specimen collection method  Answer:  Lab=Lab collect   04/29/21 1047   04/28/21 2214  Methylmalonic acid, serum  Add-on,   AD        04/28/21 2213            Medications reviewed:  Scheduled Meds:  (feeding supplement) PROSource Plus  30 mL Oral BID BM   feeding supplement  237 mL Oral Q24H   furosemide  40 mg  Intravenous Daily   insulin aspart  0-6 Units Subcutaneous Q6H   multivitamin with minerals  1 tablet Oral Daily   pantoprazole (PROTONIX) IV  40 mg Intravenous Q12H   umeclidinium-vilanterol  1 puff Inhalation Daily   Continuous Infusions:  cefTRIAXone (ROCEPHIN)  IV 2 g (04/30/21 2322)   Consultants:see note  Procedures:see note Antimicrobials: Anti-infectives (From admission, onward)    Start     Dose/Rate Route Frequency Ordered Stop   04/28/21 2300  cefTRIAXone (ROCEPHIN) 2 g in sodium chloride 0.9 % 100 mL IVPB        2 g 200 mL/hr over  30 Minutes Intravenous Every 24 hours 04/28/21 2238        Culture/Microbiology    Component Value Date/Time   SDES BLOOD SITE NOT SPECIFIED 01/27/2021 1748   SDES BLOOD SITE NOT SPECIFIED 01/27/2021 1748   SPECREQUEST  01/27/2021 1748    BOTTLES DRAWN AEROBIC AND ANAEROBIC Blood Culture adequate volume   SPECREQUEST  01/27/2021 1748    BOTTLES DRAWN AEROBIC AND ANAEROBIC Blood Culture adequate volume   CULT  01/27/2021 1748    NO GROWTH 5 DAYS Performed at [facility], [place] [address], [place], [place] [address]    CULT  01/27/2021 1748    NO GROWTH 5 DAYS Performed at [place] [address], [place], [place] [address]    REPTSTATUS 02/01/2021 FINAL 01/27/2021 1748   REPTSTATUS 02/01/2021 FINAL 01/27/2021 1748    Other culture-see note  Objective: Vitals: Today's Vitals   04/30/21 2033 04/30/21 2349 05/01/21 0507 05/01/21 0830  BP: (!) 130/50  127/89   Pulse: (!) 57  78   Resp: 19  16   Temp: 98 F (36.7 C)  98.7 F (37.1 C)   TempSrc: Oral  Oral   SpO2: 100%  97%   Weight:   98.8 kg   Height:      PainSc:  0-No pain  0-No pain    Intake/Output Summary (Last 24 hours) at 05/01/2021 1128 Last data filed at 04/30/2021 2322 Gross per 24 hour  Intake 1050 ml  Output 1500 ml  Net -450 ml    Filed Weights   04/29/21 0839 04/30/21 0500 05/01/21 0507  Weight: 99.2 kg 100.5 kg 98.8 kg   Weight change:  -0.361 kg  Intake/Output from previous day: 09/02 0701 - 09/03 0700 In: 1530 [P.O.:960; Blood:470; IV Piggyback:100] Out: 2150 [Urine:2150] Intake/Output this shift: No intake/output data recorded. Filed Weights   04/29/21 0839 04/30/21 0500 05/01/21 0507  Weight: 99.2 kg 100.5 kg 98.8 kg   Examination: General exam: AAOx 3 older than stated age, Obese, pleasant, comfortable HEENT:Oral mucosa moist, Ear/Nose WNL grossly, dentition normal. Respiratory system: bilaterally diminished, , no use of accessory muscle Cardiovascular system: S1 & S2 +, No JVD,. Gastrointestinal system: Abdomen soft, NT,ND, BS+ Nervous System:Alert, awake, moving extremities and grossly nonfocal Extremities: b/l leg edema-improving, now skin wrinkle visible, distal peripheral pulses palpable.  Skin: No rashes,no icterus. MSK: Normal muscle bulk,tone, power   Data Reviewed: I have personally reviewed following labs and imaging studies CBC: Recent Labs  Lab 04/28/21 1843 04/29/21 0450 04/29/21 0903 04/29/21 2146 04/30/21 0413 05/01/21 0407  WBC 2.2* 2.1*  --   --  2.3* 2.2*  NEUTROABS 1.4*  --   --   --   --   --   HGB 5.9* 7.2* 7.3* 7.1* 7.0* 8.4*  HCT 20.8* 24.5* 24.2* 23.5* 23.3* 27.6*  MCV 78.2* 79.8*  --   --  78.5* 79.8*  PLT 107* 100*  --   --  83* 76*    Basic Metabolic Panel: Recent Labs  Lab 04/28/21 1843 04/29/21 0450 04/30/21 0413 05/01/21 0407  NA 144 143 133* 133*  K 4.1 3.6 4.2 3.8  CL 119* 114* 105 105  CO2 20* 22 23 21*  GLUCOSE 96 73 119* 73  BUN 14 14 16 14   CREATININE 0.93 0.74 1.01 0.85  CALCIUM 8.9 8.9 8.1* 8.1*  MG  --  2.0  --   --   PHOS  --  3.4  --   --  GFR: Estimated Creatinine Clearance: 85.6 mL/min (by C-G formula based on SCr of 0.85 mg/dL). Liver Function Tests: Recent Labs  Lab 04/28/21 1843 04/29/21 0450  AST 29 26  ALT 21 21  ALKPHOS 127* 118  BILITOT 1.2 1.3*  PROT 6.9 7.0  ALBUMIN 3.2* 3.3*    No results for input(s): LIPASE,  AMYLASE in the last 168 hours. Recent Labs  Lab 04/28/21 1843  AMMONIA 93*    Coagulation Profile: Recent Labs  Lab 04/29/21 0450  INR 1.3*    Cardiac Enzymes: No results for input(s): CKTOTAL, CKMB, CKMBINDEX, TROPONINI in the last 168 hours. BNP (last 3 results) No results for input(s): PROBNP in the last 8760 hours. HbA1C: No results for input(s): HGBA1C in the last 72 hours. CBG: Recent Labs  Lab 04/30/21 0750 04/30/21 1204 04/30/21 1752 04/30/21 2314 05/01/21 1121  GLUCAP 75 132* 94 85 105*    Lipid Profile: No results for input(s): CHOL, HDL, LDLCALC, TRIG, CHOLHDL, LDLDIRECT in the last 72 hours. Thyroid Function Tests: No results for input(s): TSH, T4TOTAL, FREET4, T3FREE, THYROIDAB in the last 72 hours. Anemia Panel: Recent Labs    04/29/21 0450 04/29/21 0903  FOLATE  --  12.9  FERRITIN  --  11*  TIBC  --  442  IRON  --  19*  RETICCTPCT 0.9  --     Sepsis Labs: No results for input(s): PROCALCITON, LATICACIDVEN in the last 168 hours.  Recent Results (from the past 240 hour(s))  SARS CORONAVIRUS 2 (TAT 6-24 HRS) Nasopharyngeal Nasopharyngeal Swab     Status: None   Collection Time: 04/28/21  9:27 PM   Specimen: Nasopharyngeal Swab  Result Value Ref Range Status   SARS Coronavirus 2 NEGATIVE NEGATIVE Final    Comment: (NOTE) SARS-CoV-2 target nucleic acids are NOT DETECTED.  The SARS-CoV-2 RNA is generally detectable in upper and lower respiratory specimens during the acute phase of infection. Negative results do not preclude SARS-CoV-2 infection, do not rule out co-infections with other pathogens, and should not be used as the sole basis for treatment or other patient management decisions. Negative results must be combined with clinical observations, patient history, and epidemiological information. The expected result is Negative.  Fact Sheet for Patients: SugarRoll.be  Fact Sheet for Healthcare  Providers: https://www.woods-mathews.com/  This test is not yet approved or cleared by the [place] FDA and  has been authorized for detection and/or diagnosis of SARS-CoV-2 by FDA under an Emergency Use Authorization (EUA). This EUA will remain  in effect (meaning this test can be used) for the duration of the COVID-19 declaration under Se ction 564(b)(1) of the Act, 21 U.S.C. section 360bbb-3(b)(1), unless the authorization is terminated or revoked sooner.  Performed at [facility], [place] [address] [place], [place] [address]      Radiology Studies: No results found.   LOS: 3 days   Antonieta Pert, MD [facility]  05/01/2021, 11:28 AM

## 2021-05-01 NOTE — Progress Notes (Signed)
Subjective: Only consumed about half of prep. Passing formed brown stools. No further overt hematochezia.  Objective: Vital signs in last 24 hours: Temp:  [98 F (36.7 C)-98.7 F (37.1 C)] 98.7 F (37.1 C) (09/03 0507) Pulse Rate:  [57-78] 78 (09/03 0507) Resp:  [14-20] 16 (09/03 0507) BP: (116-132)/(50-89) 127/89 (09/03 0507) SpO2:  [97 %-100 %] 97 % (09/03 0507) Weight:  [98.8 kg] 98.8 kg (09/03 0507) Weight change: -0.361 kg Last BM Date: 04/30/21  PE: GEN:  Jaundiced, NAD ABD:  Soft  Lab Results: CBC    Component Value Date/Time   WBC 2.2 (L) 05/01/2021 0407   RBC 3.46 (L) 05/01/2021 0407   HGB 8.4 (L) 05/01/2021 0407   HCT 27.6 (L) 05/01/2021 0407   PLT 76 (L) 05/01/2021 0407   MCV 79.8 (L) 05/01/2021 0407   MCH 24.3 (L) 05/01/2021 0407   MCHC 30.4 05/01/2021 0407   RDW 19.7 (H) 05/01/2021 0407   LYMPHSABS 0.4 (L) 04/28/2021 1843   MONOABS 0.3 04/28/2021 1843   EOSABS 0.1 04/28/2021 1843   BASOSABS 0.0 04/28/2021 1843  CMP     Component Value Date/Time   NA 133 (L) 05/01/2021 0407   NA 139 08/25/2020 1420   K 3.8 05/01/2021 0407   CL 105 05/01/2021 0407   CO2 21 (L) 05/01/2021 0407   GLUCOSE 73 05/01/2021 0407   BUN 14 05/01/2021 0407   BUN 26 08/25/2020 1420   CREATININE 0.85 05/01/2021 0407   CREATININE 0.75 06/09/2015 1609   CALCIUM 8.1 (L) 05/01/2021 0407   PROT 7.0 04/29/2021 0450   ALBUMIN 3.3 (L) 04/29/2021 0450   AST 26 04/29/2021 0450   ALT 21 04/29/2021 0450   ALKPHOS 118 04/29/2021 0450   BILITOT 1.3 (H) 04/29/2021 0450   GFRNONAA >60 05/01/2021 0407   GFRAA 59 (L) 08/25/2020 1420   Assessment:   Recurrent hematochezia, anemia, GI bleeding.  Multiple prior egds and colonoscopy.  Perhaps portal gastropathy, perhaps ICV avm, perhaps other. Cirrhosis, with relative leukopenia (WBC ~ 1, ANC ~ 1000) and thrombocytopenia.  Plan:   Continue clear liquids.  Continue bowel prep. Recheck CBC in AM; if platelets drop much more, he will need  platelets prior to his endoscopic procedures. [facility] will follow.   [name] 05/01/2021, 9:45 AM   Cell [phone] If no answer or after 5 PM call [phone]

## 2021-05-01 NOTE — Progress Notes (Signed)
Pt encouraged to continuing golytely.  Pt is reluctant to do so.  With much coaxing pt has drank about 240cc.  Will continue to encourage pt and informed what stools should look like when bowel prep is effective.

## 2021-05-01 NOTE — Progress Notes (Signed)
Pt encouraged to continue to drink golytely for procedure on tomorrow.  Pt states "I don't want to drink any more of that mess."  Pt encouraged to continue to drink the bowel prep so that the procedure will not be delayed.  Stool is liquid green/brown with solid pieces.

## 2021-05-01 NOTE — Plan of Care (Signed)

## 2021-05-02 ENCOUNTER — Inpatient Hospital Stay (HOSPITAL_COMMUNITY): Payer: Medicare HMO | Admitting: Certified Registered Nurse Anesthetist

## 2021-05-02 ENCOUNTER — Encounter (HOSPITAL_COMMUNITY)
Admission: EM | Disposition: A | Discharge status: Home / Self Care | Payer: Self-pay | Source: Home / Self Care | Attending: Internal Medicine

## 2021-05-02 HISTORY — PX: COLONOSCOPY: SHX5424

## 2021-05-02 HISTORY — PX: HEMOSTASIS CLIP PLACEMENT: SHX6857

## 2021-05-02 HISTORY — PX: POLYPECTOMY: SHX5525

## 2021-05-02 HISTORY — PX: BIOPSY: SHX5522

## 2021-05-02 HISTORY — PX: ESOPHAGOGASTRODUODENOSCOPY: SHX5428

## 2021-05-02 LAB — BASIC METABOLIC PANEL
Anion gap: 4 — ABNORMAL LOW (ref 5–15)
BUN: 12 mg/dL (ref 8–23)
CO2: 23 mmol/L (ref 22–32)
Calcium: 8.4 mg/dL — ABNORMAL LOW (ref 8.9–10.3)
Chloride: 109 mmol/L (ref 98–111)
Creatinine, Ser: 0.77 mg/dL (ref 0.61–1.24)
GFR, Estimated: >60 mL/min (ref >60)
Glucose, Bld: 89 mg/dL (ref 70–99)
Potassium: 4 mmol/L (ref 3.5–5.1)
Sodium: 136 mmol/L (ref 135–145)

## 2021-05-02 LAB — CBC
HCT: 26.4 % — ABNORMAL LOW (ref 39.0–52.0)
Hemoglobin: 8 g/dL — ABNORMAL LOW (ref 13.0–17.0)
MCH: 24.2 pg — ABNORMAL LOW (ref 26.0–34.0)
MCHC: 30.3 g/dL (ref 30.0–36.0)
MCV: 79.8 fL — ABNORMAL LOW (ref 80.0–100.0)
Platelets: 76 10*3/uL — ABNORMAL LOW (ref 150–400)
RBC: 3.31 MIL/uL — ABNORMAL LOW (ref 4.22–5.81)
RDW: 19.7 % — ABNORMAL HIGH (ref 11.5–15.5)
WBC: 2.5 10*3/uL — ABNORMAL LOW (ref 4.0–10.5)
nRBC: 0 % (ref 0.0–0.2)

## 2021-05-02 LAB — GLUCOSE, CAPILLARY
Glucose-Capillary: 134 mg/dL — ABNORMAL HIGH (ref 70–99)
Glucose-Capillary: 91 mg/dL (ref 70–99)
Glucose-Capillary: 91 mg/dL (ref 70–99)
Glucose-Capillary: 94 mg/dL (ref 70–99)

## 2021-05-02 SURGERY — EGD (ESOPHAGOGASTRODUODENOSCOPY)
Anesthesia: Monitor Anesthesia Care

## 2021-05-02 MED ORDER — LACTATED RINGERS IV SOLN: INTRAVENOUS | Status: DC | PRN

## 2021-05-02 MED ORDER — LACTULOSE 10 GM/15ML PO SOLN
30.0000 g | Freq: Three times a day (TID) | ORAL | Status: DC
  Administered 2021-05-02 – 2021-05-05 (×9): 30 g via ORAL
  Filled 2021-05-02 (×9): qty 60

## 2021-05-02 MED ORDER — LIDOCAINE 2% (20 MG/ML) 5 ML SYRINGE
INTRAMUSCULAR | Status: DC | PRN
  Administered 2021-05-02: 80 mg via INTRAVENOUS

## 2021-05-02 MED ORDER — EPHEDRINE SULFATE-NACL 50-0.9 MG/10ML-% IV SOSY
PREFILLED_SYRINGE | INTRAVENOUS | Status: DC | PRN
  Administered 2021-05-02: 10 mg via INTRAVENOUS
  Administered 2021-05-02: 5 mg via INTRAVENOUS
  Administered 2021-05-02: 10 mg via INTRAVENOUS
  Administered 2021-05-02: 5 mg via INTRAVENOUS

## 2021-05-02 MED ORDER — PROPOFOL 500 MG/50ML IV EMUL
INTRAVENOUS | Status: DC | PRN
  Administered 2021-05-02: 125 ug/kg/min via INTRAVENOUS

## 2021-05-02 MED ORDER — INSULIN ASPART 100 UNIT/ML IJ SOLN
0.0000 [IU] | Freq: Three times a day (TID) | INTRAMUSCULAR | Status: DC
  Administered 2021-05-03: 1 [IU] via SUBCUTANEOUS

## 2021-05-02 MED ORDER — SPIRONOLACTONE 25 MG PO TABS
25.0000 mg | ORAL_TABLET | Freq: Every day | ORAL | Status: DC
  Administered 2021-05-03 – 2021-05-05 (×3): 25 mg via ORAL
  Filled 2021-05-02 (×3): qty 1

## 2021-05-02 MED ORDER — PROPOFOL 10 MG/ML IV BOLUS
INTRAVENOUS | Status: DC | PRN
  Administered 2021-05-02: 40 mg via INTRAVENOUS

## 2021-05-02 NOTE — Progress Notes (Signed)
Pt tolerating full liquids well for dinner. No needs at this time. Rn will advance to soft diet for breakfast.

## 2021-05-02 NOTE — Op Note (Signed)
[facility] Patient Name: [name] Procedure Date: 05/02/2021 MRN: [id number] Attending MD: Arta Silence , MD Date of Birth: [birth year] CSN: [id number] Age: 75 Admit Type: Inpatient Procedure:                Upper GI endoscopy Indications:              Iron deficiency anemia secondary to chronic blood                            loss, Hematochezia Providers:                Arta Silence, MD, Particia Nearing, RN, Benetta Spar, Technician Referring MD:              Medicines:                Monitored Anesthesia Care Complications:            No immediate complications. Estimated Blood Loss:     Estimated blood loss: none. Procedure:                Pre-Anesthesia Assessment:                           - Prior to the procedure, a History and Physical                            was performed, and patient medications and                            allergies were reviewed. The patient's tolerance of                            previous anesthesia was also reviewed. The risks                            and benefits of the procedure and the sedation                            options and risks were discussed with the patient.                            All questions were answered, and informed consent                            was obtained. Prior Anticoagulants: The patient has                            taken no previous anticoagulant or antiplatelet                            agents. ASA Grade Assessment: III - A patient with                            severe  systemic disease. After reviewing the risks                            and benefits, the patient was deemed in                            satisfactory condition to undergo the procedure.                           After obtaining informed consent, the endoscope was                            passed under direct vision. Throughout the                            procedure, the patient's blood  pressure, pulse, and                            oxygen saturations were monitored continuously. The                            [id number]-[id number] ([id number] Olympus endoscope was introduced                            through the mouth, and advanced to the second part                            of duodenum. The upper GI endoscopy was                            accomplished without difficulty. The patient                            tolerated the procedure well. Scope In: Scope Out: Findings:      The examined esophagus was normal.      Mild portal hypertensive gastropathy was found in the cardia, in the       gastric fundus and in the gastric body.      Type 1 isolated gastric varices (IGV1, varices located in the fundus)       with no bleeding were found in the cardia. There were no stigmata of       recent bleeding. They were small in largest diameter.      Multiple small angioectasias with no bleeding were found in the gastric       fundus, in the gastric body and in the gastric antrum.      The exam of the stomach was otherwise normal.      Few small proximal duodenal avms, otherwise the duodenal bulb, first       portion of the duodenum and second portion of the duodenum were normal.      No old or fresh blood was seen to the extent of our examination. Impression:               - Normal esophagus.                           -  Portal hypertensive gastropathy.                           - Type 1 isolated gastric varices (IGV1, varices                            located in the fundus), without bleeding.                           - Multiple non-bleeding angioectasias in the                            stomach.                           - Few small proximal duodenal amvs, otherwise                            normal duodenal bulb, first portion of the duodenum                            and second portion of the duodenum.                           - In total, estimate over 15 gastric avms as well                             as mild-moderate portal gastropathy. These could                            all lead to chronic anemia, but would be very                            unlikely to cause frank hematochezia (which is what                            patient was experiencing). Moderate Sedation:      None Recommendation:           - Perform a colonoscopy today. Procedure Code(s):        --- Professional ---                           [phone], Esophagogastroduodenoscopy, flexible,                            transoral; diagnostic, including collection of                            specimen(s) by brushing or washing, when performed                            (separate procedure) Diagnosis Code(s):        --- Professional ---  K76.6, Portal hypertension                           K31.89, Other diseases of stomach and duodenum                           I86.4, Gastric varices                           K31.819, Angiodysplasia of stomach and duodenum                            without bleeding                           D50.0, Iron deficiency anemia secondary to blood                            loss (chronic)                           K92.1, Melena (includes Hematochezia) CPT copyright 2019 American Medical Association. All rights reserved. The codes documented in this report are preliminary and upon coder review may  be revised to meet current compliance requirements. Arta Silence, MD 05/02/2021 11:12:24 AM This report has been signed electronically. Number of Addenda: 0

## 2021-05-02 NOTE — Op Note (Signed)
[facility] Patient Name: [name] Procedure Date: 05/02/2021 MRN: [id number] Attending MD: Arta Silence , MD Date of Birth: Feb 10, 1946 CSN: [id number] Age: 75 Admit Type: Inpatient Procedure:                Colonoscopy Indications:              Hematochezia, Iron deficiency anemia secondary to                            chronic blood loss Providers:                Arta Silence, MD, Particia Nearing, RN, Benetta Spar, Technician Referring MD:              Medicines:                Monitored Anesthesia Care Complications:            No immediate complications. Estimated Blood Loss:     Estimated blood loss: none. Procedure:                Pre-Anesthesia Assessment:                           - Prior to the procedure, a History and Physical                            was performed, and patient medications and                            allergies were reviewed. The patient's tolerance of                            previous anesthesia was also reviewed. The risks                            and benefits of the procedure and the sedation                            options and risks were discussed with the patient.                            All questions were answered, and informed consent                            was obtained. Prior Anticoagulants: The patient has                            taken no previous anticoagulant or antiplatelet                            agents. ASA Grade Assessment: III - A patient with                            severe systemic disease.  After reviewing the risks                            and benefits, the patient was deemed in                            satisfactory condition to undergo the procedure.                           - Prior to the procedure, a History and Physical                            was performed, and patient medications and                            allergies were reviewed. The patient's  tolerance of                            previous anesthesia was also reviewed. The risks                            and benefits of the procedure and the sedation                            options and risks were discussed with the patient.                            All questions were answered, and informed consent                            was obtained. Prior Anticoagulants: The patient has                            taken no previous anticoagulant or antiplatelet                            agents. ASA Grade Assessment: III - A patient with                            severe systemic disease. After reviewing the risks                            and benefits, the patient was deemed in                            satisfactory condition to undergo the procedure.                           After obtaining informed consent, the colonoscope                            was passed under direct vision. Throughout the  procedure, the patient's blood pressure, pulse, and                            oxygen saturations were monitored continuously. The                            PCF-HQ190L ([id number] Olympus colonoscope was                            introduced through the anus and advanced to the the                            cecum, identified by appendiceal orifice and                            ileocecal valve. The colonoscopy was performed                            without difficulty. The patient tolerated the                            procedure well. The quality of the bowel                            preparation was good. Scope In: 10:38:45 AM Scope Out: 11:00:07 AM Scope Withdrawal Time: 0 hours 15 minutes 10 seconds  Total Procedure Duration: 0 hours 21 minutes 22 seconds  Findings:      Hemorrhoids were found on perianal exam.      A patchy area of mildly ulcerated mucosa was found in the rectum and in       the recto-sigmoid colon. Biopsies were taken with a cold  forceps for       histology.      A few medium-mouthed diverticula were found in the sigmoid colon and       descending colon.      A single medium-sized localized angioectasia without bleeding was found       at the ileocecal valve. For hemostasis, one hemostatic clip was       successfully placed. There was no bleeding at the end of the procedure.      A few small patchy angioectasias without bleeding were found in the       ascending colon.      Colon otherwise normal; no other polyps, masses, vascular ectasias, or       inflammatory changes were seen.      The retroflexed view of the distal rectum and anal verge was normal and       showed no anal or rectal abnormalities. Impression:               - Hemorrhoids found on perianal exam.                           - Ulcerated mucosa in the rectum and in the                            recto-sigmoid colon. Biopsied.                           -  Diverticulosis in the sigmoid colon and in the                            descending colon.                           - A single non-bleeding colonic angioectasia. Clip                            was placed.                           - A few non-bleeding colonic angioectasias.                           - The distal rectum and anal verge are normal on                            retroflexion view.                           - The examination was otherwise normal.                           - Overall, I think there are two issues. First, the                            acute hematochezia, this is most likely from                            diverticulosis or the rectosigmoid colitis or                            hemorrhoids; fortunately, at this point the                            bleeding has resolved; there was no blood seen                            anywhere to the extent of our examinatoin. I don't                            think any of these entities are cause of patient's                             acute on chronic anemia. Patient's acute on chronic                            anemia is most likely from combination of portal                            gastropathy and diffuse GI tract AVMs. Would  ideally treat the anemia with oral and parenteral                            iron, but if this does not suffice, as outpatient                            could consider capsule endoscopy; if AVMs seem most                            limited to the stomach (which I think is unlikely),                            then could consider EGD with diffuse APC (which is                            only done by Dr. Watt Climes in our group); if diffuse                            AVMs seen throughout the small bowel, which is what                            I would suspect, then doubt much utility in                            targeted APC/ablation of these avms, and would most                            likely just treat anemia supportively (periodic                            transfusions) without repeat ongoing multiple                            procedures. Moderate Sedation:      Not Applicable - Patient had care per Anesthesia. Recommendation:           - Return patient to hospital ward for ongoing care.                           - Clear liquid diet today.                           - Continue present medications.                           - Await pathology results.                           - Repeat colonoscopy (date not yet determined) for                            surveillance based on pathology results.                           -  [facility] will follow. Procedure Code(s):        --- Professional ---                           [phone], 38, Colonoscopy, flexible; with control of                            bleeding, any method                           45380, Colonoscopy, flexible; with biopsy, single                            or multiple Diagnosis Code(s):        ---  Professional ---                           K62.6, Ulcer of anus and rectum                           K63.3, Ulcer of intestine                           K64.9, Unspecified hemorrhoids                           K55.20, Angiodysplasia of colon without hemorrhage                           K92.1, Melena (includes Hematochezia)                           D50.0, Iron deficiency anemia secondary to blood                            loss (chronic)                           K57.30, Diverticulosis of large intestine without                            perforation or abscess without bleeding CPT copyright 2019 American Medical Association. All rights reserved. The codes documented in this report are preliminary and upon coder review may  be revised to meet current compliance requirements. Arta Silence, MD 05/02/2021 11:21:37 AM This report has been signed electronically. Number of Addenda: 0

## 2021-05-02 NOTE — Progress Notes (Signed)
PROGRESS NOTE  [name]    DOB: [birth year], 75 y.o.  PXT:[id number]  PCP: [name], MD   Code Status: Full Code   DOA: 04/28/2021   LOS: 4  Brief Narrative of Current Hospitalization  [name] is a 75 y.o. male with a PMH significant for chronic anemia Baseline hemoglobin 7-8.5 g recurrent GI bleed, liver cirrhosis, chronic diastolic CHF, O2VO, history of pancytopenia, recent WBC count 2.3 on 7/31 COPD presented to the ED with shortness of breath for 3 to 4 days progressively worse with orthopnea mild increase in edema lower leg, no chest pain. Patient was admitted to medicine service for further workup and management of symptomatic anemia as outlined in detail below.  05/02/21 -returned from endoscopy and feels well and hungry  Assessment & Plan  Principal Problem:   Acute on chronic anemia Active Problems:   Type 2 diabetes with complication (HCC)   SOB (shortness of breath)   Acute on chronic diastolic CHF (congestive heart failure) (HCC)   Acute upper GI bleed   Cirrhosis (HCC)  Symptomatic anemia 2/2 GI source. Hgb 8.0 today. Platelets stable at 76 today. - EGD and colonoscopy today tolerated well.  Positive for hemorrhoids, angioectasia (non-bleeding) was clipped, and gastric varices seen without active source of bleeding. - PPI - advance diet per GI - CBC am  Cirrhosis with portal HTN-  - SBP ppx - adding back home lactulose - adding back lower dose spironolactone as BP tolerates  CHF - cont lasix  T2DM - sSSI - holding metformin  DVT prophylaxis: SCDs Start: 04/29/21 0349 SCDs Start: 04/28/21 2158   Diet:  Diet Orders (From admission, onward)     Start     Ordered   05/02/21 0001  Diet NPO time specified  Diet effective midnight        05/01/21 0949            Subjective 05/02/21    Pt reports feeling good after his endoscopy procedures. He is hungry and wants to eat.  Disposition Plan & Communication  Status is:  Inpatient  Remains inpatient appropriate because:Inpatient level of care appropriate due to severity of illness  Dispo: The patient is from: Home              Anticipated d/c is to: Home              Patient currently is not medically stable to d/c.   Difficult to place patient No  Family Communication: NA   Consults, Procedures, Significant Events  Consultants:  GI  Procedures/significant events:  EGD, colonoscopy  Antimicrobials:  Anti-infectives (From admission, onward)    Start     Dose/Rate Route Frequency Ordered Stop   04/28/21 2300  cefTRIAXone (ROCEPHIN) 2 g in sodium chloride 0.9 % 100 mL IVPB        2 g 200 mL/hr over 30 Minutes Intravenous Every 24 hours 04/28/21 2238          Objective   Vitals:   05/01/21 1300 05/01/21 2048 05/02/21 0500 05/02/21 0913  BP: (!) 123/57 (!) 103/55    Pulse: 60 (!) 55    Resp:  16    Temp: 97.8 F (36.6 C) 97.7 F (36.5 C)    TempSrc: Oral Oral    SpO2: 100% 100%  97%  Weight:   100.1 kg   Height:        Intake/Output Summary (Last 24 hours) at 05/02/2021 0935 Last data filed  at 05/02/2021 0724 Gross per 24 hour  Intake 200 ml  Output 825 ml  Net -625 ml   Filed Weights   04/30/21 0500 05/01/21 0507 05/02/21 0500  Weight: 100.5 kg 98.8 kg 100.1 kg    Patient BMI: Body mass index is 33.55 kg/m.   Physical Exam: General: awake, alert, NAD HEENT: atraumatic, clear conjunctiva, anicteric sclera, moist mucus membranes, hearing grossly normal Respiratory: CTAB, no wheezes, rales or rhonchi, normal respiratory effort. Cardiovascular: quick capillary refill  Gastrointestinal: soft, NT, ND Nervous: A&O x4. no gross focal neurologic deficits, normal speech Extremities: moves all equally, tace edema, normal tone Skin: dry, intact, normal temperature, normal color, No rashes, lesions or ulcers Psychiatry: normal mood, congruent affect, judgement and insight appear normal  Labs   I have personally reviewed following labs  and imaging studies No results displayed because visit has over 200 results.       Recent Results (from the past 240 hour(s))  SARS CORONAVIRUS 2 (TAT 6-24 HRS) Nasopharyngeal Nasopharyngeal Swab     Status: None   Collection Time: 04/28/21  9:27 PM   Specimen: Nasopharyngeal Swab  Result Value Ref Range Status   SARS Coronavirus 2 NEGATIVE NEGATIVE Final    Comment: (NOTE) SARS-CoV-2 target nucleic acids are NOT DETECTED.  The SARS-CoV-2 RNA is generally detectable in upper and lower respiratory specimens during the acute phase of infection. Negative results do not preclude SARS-CoV-2 infection, do not rule out co-infections with other pathogens, and should not be used as the sole basis for treatment or other patient management decisions. Negative results must be combined with clinical observations, patient history, and epidemiological information. The expected result is Negative.  Fact Sheet for Patients: SugarRoll.be  Fact Sheet for Healthcare Providers: https://www.woods-mathews.com/  This test is not yet approved or cleared by the [place] FDA and  has been authorized for detection and/or diagnosis of SARS-CoV-2 by FDA under an Emergency Use Authorization (EUA). This EUA will remain  in effect (meaning this test can be used) for the duration of the COVID-19 declaration under Se ction 564(b)(1) of the Act, 21 U.S.C. section 360bbb-3(b)(1), unless the authorization is terminated or revoked sooner.  Performed at [facility], [address], [place], [place] [address]      Imaging Studies  No results found. Medications   Scheduled Meds:  (feeding supplement) PROSource Plus  30 mL Oral BID BM   feeding supplement  237 mL Oral Q24H   furosemide  40 mg Intravenous Daily   insulin aspart  0-6 Units Subcutaneous Q6H   multivitamin with minerals  1 tablet Oral Daily   pantoprazole (PROTONIX) IV  40 mg Intravenous Q12H    umeclidinium-vilanterol  1 puff Inhalation Daily   Continuous Infusions:  cefTRIAXone (ROCEPHIN)  IV Stopped (05/01/21 2355)     LOS: 4 days   Time spent: >33min   Richarda Osmond, DO [facility] 05/02/2021, 9:35 AM   To contact the [facility] Attending or Consulting provider for this patient: Check the care team in [facility] for a) attending/consulting [place] provider listed and b) the [facility] team listed Log into [email or web address] and use [place]'s universal password to access. If you do not have the password, please contact the hospital operator. Locate the [facility] provider you are looking for under [facility] and page to a number that you can be directly reached. If you still have difficulty reaching the provider, please page the [facility] (Director on Call) for the Hospitalists listed on  amion for assistance.

## 2021-05-02 NOTE — Anesthesia Preprocedure Evaluation (Addendum)
Anesthesia Evaluation  Patient identified by MRN, date of birth, ID band Patient awake    Reviewed: Allergy & Precautions, NPO status , Patient's Chart, lab work & pertinent test results  Airway Mallampati: III  TM Distance: >3 FB Neck ROM: Full    Dental  (+) Edentulous Upper, Edentulous Lower   Pulmonary asthma , sleep apnea , former smoker,    Pulmonary exam normal breath sounds clear to auscultation       Cardiovascular hypertension, + CAD and + CABG  Normal cardiovascular exam Rhythm:Regular Rate:Normal  ECG: SR, rate 68   Neuro/Psych negative neurological ROS     GI/Hepatic PUD, GERD  Medicated and Controlled,(+) Cirrhosis       ,   Endo/Other  diabetes, Oral Hypoglycemic Agents  Renal/GU Renal disease     Musculoskeletal  (+) Arthritis , Gout   Abdominal   Peds  Hematology  (+) Blood dyscrasia, anemia , Thrombocytopenia    Anesthesia Other Findings Iron Deficiency Anemia, rectal bleeding, hx of Upper GI bleeding due to gastric varices and portal hypertensive gastropathy  Reproductive/Obstetrics                            Anesthesia Physical Anesthesia Plan  ASA: 3  Anesthesia Plan: MAC   Post-op Pain Management:    Induction: Intravenous  PONV Risk Score and Plan: 1 and Propofol infusion and Treatment may vary due to age or medical condition  Airway Management Planned: Nasal Cannula  Additional Equipment:   Intra-op Plan:   Post-operative Plan:   Informed Consent: I have reviewed the patients History and Physical, chart, labs and discussed the procedure including the risks, benefits and alternatives for the proposed anesthesia with the patient or authorized representative who has indicated his/her understanding and acceptance.       Plan Discussed with: CRNA  Anesthesia Plan Comments:         Anesthesia Quick Evaluation

## 2021-05-02 NOTE — Plan of Care (Signed)
  Problem: Clinical Measurements: Goal: Diagnostic test results will improve Outcome: Progressing   Problem: Clinical Measurements: Goal: Respiratory complications will improve Outcome: Progressing   Problem: Activity: Goal: Risk for activity intolerance will decrease Outcome: Progressing   Problem: Nutrition: Goal: Adequate nutrition will be maintained Outcome: Progressing   Problem: Elimination: Goal: Will not experience complications related to bowel motility Outcome: Progressing

## 2021-05-02 NOTE — Plan of Care (Signed)

## 2021-05-02 NOTE — Anesthesia Postprocedure Evaluation (Signed)
Anesthesia Post Note  Patient: [name]  Procedure(s) Performed: ESOPHAGOGASTRODUODENOSCOPY (EGD) COLONOSCOPY HEMOSTASIS CLIP PLACEMENT BIOPSY     Patient location during evaluation: PACU Anesthesia Type: MAC Level of consciousness: awake Pain management: pain level controlled Vital Signs Assessment: post-procedure vital signs reviewed and stable Respiratory status: spontaneous breathing, nonlabored ventilation, respiratory function stable and patient connected to nasal cannula oxygen Cardiovascular status: stable and blood pressure returned to baseline Postop Assessment: no apparent nausea or vomiting Anesthetic complications: no   No notable events documented.  Last Vitals:  Vitals:   05/02/21 1130 05/02/21 1218  BP: 134/70 (!) 129/51  Pulse: 75 80  Resp: 15   Temp:  36.7 C  SpO2: 99% 99%    Last Pain:  Vitals:   05/02/21 1250  TempSrc:   PainSc: 2                  Liana Camerer P Kenzee Bassin

## 2021-05-02 NOTE — Transfer of Care (Signed)
Immediate Anesthesia Transfer of Care Note  Patient: [name]  Procedure(s) Performed: ESOPHAGOGASTRODUODENOSCOPY (EGD) COLONOSCOPY HEMOSTASIS CLIP PLACEMENT BIOPSY  Patient Location: PACU  Anesthesia Type:MAC  Level of Consciousness: awake, alert  and oriented  Airway & Oxygen Therapy: Patient Spontanous Breathing and Patient connected to face mask  Post-op Assessment: Report given to RN and Post -op Vital signs reviewed and stable  Post vital signs: Reviewed and stable  Last Vitals:  Vitals Value Taken Time  BP 132/66 05/02/21 1108  Temp    Pulse 81 05/02/21 1111  Resp 16 05/02/21 1111  SpO2 100 % 05/02/21 1111  Vitals shown include unvalidated device data.  Last Pain:  Vitals:   05/02/21 1004  TempSrc: Temporal  PainSc: 0-No pain      Patients Stated Pain Goal: 0 (43/20/03 7944)  Complications: No notable events documented.

## 2021-05-02 NOTE — Progress Notes (Signed)
Pt down to endo in stable condition. No needs at time of transport.

## 2021-05-02 NOTE — Progress Notes (Signed)
Pt has hx of OSA, rt went to talk with patient about wearing a CPAP machine tonight. Pt refused to wear machine, patient states "his machine has been broken for several months and he will be fine, he don't won't one". Machine available if patient changes his mind

## 2021-05-02 NOTE — Interval H&P Note (Signed)
History and Physical Interval Note:  05/02/2021 10:04 AM  [name]  has presented today for surgery, with the diagnosis of Iron Deficiency Anemia, rectal bleeding, hx of Upper GI bleeding due to gastric varices and portal hypertensive gastropathy.  The various methods of treatment have been discussed with the patient and family. After consideration of risks, benefits and other options for treatment, the patient has consented to  Procedure(s): ESOPHAGOGASTRODUODENOSCOPY (EGD) (N/A) COLONOSCOPY (N/A) as a surgical intervention.  The patient's history has been reviewed, patient examined, no change in status, stable for surgery.  I have reviewed the patient's chart and labs.  Questions were answered to the patient's satisfaction.     [name]

## 2021-05-02 NOTE — Progress Notes (Signed)
Pt back from gi studies in stable condition. No needs at time of arrival to floor from pacu.

## 2021-05-03 LAB — CBC
HCT: 24.3 % — ABNORMAL LOW (ref 39.0–52.0)
Hemoglobin: 7.4 g/dL — ABNORMAL LOW (ref 13.0–17.0)
MCH: 24.3 pg — ABNORMAL LOW (ref 26.0–34.0)
MCHC: 30.5 g/dL (ref 30.0–36.0)
MCV: 79.7 fL — ABNORMAL LOW (ref 80.0–100.0)
Platelets: 72 10*3/uL — ABNORMAL LOW (ref 150–400)
RBC: 3.05 MIL/uL — ABNORMAL LOW (ref 4.22–5.81)
RDW: 19.9 % — ABNORMAL HIGH (ref 11.5–15.5)
WBC: 1.9 10*3/uL — ABNORMAL LOW (ref 4.0–10.5)
nRBC: 0 % (ref 0.0–0.2)

## 2021-05-03 LAB — TYPE AND SCREEN
ABO/RH(D): O POS
Antibody Screen: NEGATIVE
Unit division: 0
Unit division: 0
Unit division: 0

## 2021-05-03 LAB — GLUCOSE, CAPILLARY
Glucose-Capillary: 102 mg/dL — ABNORMAL HIGH (ref 70–99)
Glucose-Capillary: 119 mg/dL — ABNORMAL HIGH (ref 70–99)
Glucose-Capillary: 125 mg/dL — ABNORMAL HIGH (ref 70–99)
Glucose-Capillary: 167 mg/dL — ABNORMAL HIGH (ref 70–99)
Glucose-Capillary: 66 mg/dL — ABNORMAL LOW (ref 70–99)
Glucose-Capillary: 78 mg/dL (ref 70–99)
Glucose-Capillary: 85 mg/dL (ref 70–99)

## 2021-05-03 LAB — BASIC METABOLIC PANEL
Anion gap: 6 (ref 5–15)
BUN: 12 mg/dL (ref 8–23)
CO2: 23 mmol/L (ref 22–32)
Calcium: 8.6 mg/dL — ABNORMAL LOW (ref 8.9–10.3)
Chloride: 108 mmol/L (ref 98–111)
Creatinine, Ser: 0.86 mg/dL (ref 0.61–1.24)
GFR, Estimated: >60 mL/min (ref >60)
Glucose, Bld: 125 mg/dL — ABNORMAL HIGH (ref 70–99)
Potassium: 3.8 mmol/L (ref 3.5–5.1)
Sodium: 137 mmol/L (ref 135–145)

## 2021-05-03 LAB — BPAM RBC
Blood Product Expiration Date: 202210012359
Blood Product Expiration Date: 202210012359
Blood Product Expiration Date: 202210022359
ISSUE DATE / TIME: 202209010014
ISSUE DATE / TIME: 202209010311
ISSUE DATE / TIME: 202209021333
Unit Type and Rh: 5100
Unit Type and Rh: 5100
Unit Type and Rh: 5100

## 2021-05-03 NOTE — Plan of Care (Signed)
No further overt GI bleeding.  I feel anemia could be at least in large part GI-related, and thrombocytopenia likely from cirrhosis, but can not explain progressive drop in WBC.  Would consider inpatient hematology evaluation for pancytopenia prior to discharge, given patient's recurrent admissions related to bleeding/anemia.  [facility] will revisit tomorrow.

## 2021-05-03 NOTE — Care Management Important Message (Signed)
Important Message  Patient Details IM Letter given to the Patient. Name: [name] MRN: [id number] Date of Birth: September 23, 1945   Medicare Important Message Given:  Yes     Kerin Salen 05/03/2021, 1:40 PM

## 2021-05-03 NOTE — Progress Notes (Addendum)
PROGRESS NOTE  [name]    DOB: 09-12-1945, 75 y.o.  JAS:[id number]  PCP: [name], MD   Code Status: Full Code   DOA: 04/28/2021   LOS: 5  Brief Narrative of Current Hospitalization  [name] is a 75 y.o. male with a PMH significant for chronic anemia Baseline hemoglobin 7-8.5 g recurrent GI bleed, liver cirrhosis, chronic diastolic CHF, A1PF, history of pancytopenia, recent WBC count 2.3 on 7/31 COPD presented to the ED with shortness of breath for 3 to 4 days progressively worse with orthopnea mild increase in edema lower leg, no chest pain. Patient was admitted to medicine service for further workup and management of symptomatic anemia as outlined in detail below.  05/03/21- stable clinically  Assessment & Plan  Principal Problem:   Acute on chronic anemia Active Problems:   Type 2 diabetes with complication (HCC)   SOB (shortness of breath)   Acute on chronic diastolic CHF (congestive heart failure) (HCC)   Acute upper GI bleed   Cirrhosis (HCC)  Acute Symptomatic anemia 2/2 GI bleed on chronic anemia. Hgb decreased to 7.4 today. Patient tolerated liquid diet yesterday and is feeling well.  - PPI - advance diet - CBC this afternoon  Thrombocytopenia- severe. Platelets are stable at 76>76>72. History of low platelets noted in chart since 2012 thought to be related to cirrhosis. INR consistently 1.3 - follow up with heme/onc OP  Cirrhosis with portal HTN-  - SBP ppx - adding back home lactulose - adding back lower dose spironolactone as BP tolerates  CHF - cont lasix  T2DM - sSSI - holding metformin  DVT prophylaxis: SCDs Start: 04/29/21 0349 SCDs Start: 04/28/21 2158   Diet:  Diet Orders (From admission, onward)     Start     Ordered   05/02/21 1756  DIET SOFT Room service appropriate? Yes; Fluid consistency: Thin  Diet effective now       Comments: Carb mod and heart healthy as well please  Question Answer Comment  Room service  appropriate? Yes   Fluid consistency: Thin      05/02/21 1755            Subjective 05/03/21    Feels well. No pain. Able to tolerate soft diet and wishes to advance further. Anxious to be discharged. Had a regular BM today without blood.  Disposition Plan & Communication  Status is: Inpatient  Remains inpatient appropriate because:Inpatient level of care appropriate due to severity of illness  Dispo: The patient is from: Home              Anticipated d/c is to: Home              Patient currently is not medically stable to d/c.   Difficult to place patient No  Family Communication: NA   Consults, Procedures, Significant Events  Consultants:  GI  Procedures/significant events:  EGD, colonoscopy  Antimicrobials:  Anti-infectives (From admission, onward)    Start     Dose/Rate Route Frequency Ordered Stop   04/28/21 2300  cefTRIAXone (ROCEPHIN) 2 g in sodium chloride 0.9 % 100 mL IVPB        2 g 200 mL/hr over 30 Minutes Intravenous Every 24 hours 04/28/21 2238          Objective   Vitals:   05/02/21 1218 05/02/21 1515 05/02/21 2002 05/03/21 0435  BP: (!) 129/51 (!) 121/46 (!) 119/52 (!) 129/52  Pulse: 80 61 62 61  Resp:  18 18   Temp: 98 F (36.7 C) 97.8 F (36.6 C) 97.7 F (36.5 C) 98 F (36.7 C)  TempSrc: Oral Oral Oral Oral  SpO2: 99% 100% 100% 99%  Weight:      Height:        Intake/Output Summary (Last 24 hours) at 05/03/2021 0845 Last data filed at 05/03/2021 0600 Gross per 24 hour  Intake 1810 ml  Output 2100 ml  Net -290 ml    Filed Weights   05/01/21 0507 05/02/21 0500 05/02/21 1004  Weight: 98.8 kg 100.1 kg 100.1 kg    Patient BMI: Body mass index is 33.55 kg/m.   Physical Exam: General: awake, alert, NAD HEENT: atraumatic, clear conjunctiva, anicteric sclera, moist mucus membranes, hearing grossly normal Respiratory: normal respiratory effort. Cardiovascular: quick capillary refill  Gastrointestinal: soft, NT, ND Nervous: A&O x4.  no gross focal neurologic deficits, normal speech Extremities: moves all equally, tace edema, normal tone Skin: dry, intact, normal temperature, normal color, No rashes, lesions or ulcers Psychiatry: normal mood, congruent affect, judgement and insight appear normal  Labs   I have personally reviewed following labs and imaging studies No results displayed because visit has over 200 results.       Recent Results (from the past 240 hour(s))  SARS CORONAVIRUS 2 (TAT 6-24 HRS) Nasopharyngeal Nasopharyngeal Swab     Status: None   Collection Time: 04/28/21  9:27 PM   Specimen: Nasopharyngeal Swab  Result Value Ref Range Status   SARS Coronavirus 2 NEGATIVE NEGATIVE Final    Comment: (NOTE) SARS-CoV-2 target nucleic acids are NOT DETECTED.  The SARS-CoV-2 RNA is generally detectable in upper and lower respiratory specimens during the acute phase of infection. Negative results do not preclude SARS-CoV-2 infection, do not rule out co-infections with other pathogens, and should not be used as the sole basis for treatment or other patient management decisions. Negative results must be combined with clinical observations, patient history, and epidemiological information. The expected result is Negative.  Fact Sheet for Patients: SugarRoll.be  Fact Sheet for Healthcare Providers: https://www.woods-mathews.com/  This test is not yet approved or cleared by the [place] FDA and  has been authorized for detection and/or diagnosis of SARS-CoV-2 by FDA under an Emergency Use Authorization (EUA). This EUA will remain  in effect (meaning this test can be used) for the duration of the COVID-19 declaration under Se ction 564(b)(1) of the Act, 21 U.S.C. section 360bbb-3(b)(1), unless the authorization is terminated or revoked sooner.  Performed at [facility], [place] [address], [place], [place] [address]      Imaging Studies  No results  found. Medications   Scheduled Meds:  (feeding supplement) PROSource Plus  30 mL Oral BID BM   feeding supplement  237 mL Oral Q24H   furosemide  40 mg Intravenous Daily   insulin aspart  0-6 Units Subcutaneous TID WC   lactulose  30 g Oral TID   multivitamin with minerals  1 tablet Oral Daily   pantoprazole (PROTONIX) IV  40 mg Intravenous Q12H   spironolactone  25 mg Oral Daily   umeclidinium-vilanterol  1 puff Inhalation Daily   Continuous Infusions:  cefTRIAXone (ROCEPHIN)  IV 2 g (05/02/21 2321)     LOS: 5 days   Time spent: >62min   John Osmond, DO [facility] 05/03/2021, 8:45 AM   To contact the [facility] Attending or Consulting provider for this patient: Check the care team in [facility] for a) attending/consulting [place] provider  listed and b) the [facility] team listed Log into [email or web address] and use [place]'s universal password to access. If you do not have the password, please contact the hospital operator. Locate the [facility] provider you are looking for under [facility] and page to a number that you can be directly reached. If you still have difficulty reaching the provider, please page the [facility] (Director on Call) for the Hospitalists listed on amion for assistance.

## 2021-05-03 NOTE — Progress Notes (Signed)
Hypoglycemic Event  CBG: 66  Treatment: Patient is eating breakfast and Orange juice given 4 oz juice/soda  Symptoms: None  Follow-up CBG: [id number] CBG Result:78  Possible Reasons for Event: Unknown  Comments/MD notified:Md rounding    Eulas Post

## 2021-05-04 ENCOUNTER — Encounter (HOSPITAL_COMMUNITY): Payer: Self-pay | Admitting: Gastroenterology

## 2021-05-04 ENCOUNTER — Inpatient Hospital Stay (HOSPITAL_COMMUNITY): Payer: Medicare HMO

## 2021-05-04 LAB — CBC WITH DIFFERENTIAL/PLATELET
Abs Immature Granulocytes: 0.01 10*3/uL (ref 0.00–0.07)
Basophils Absolute: 0 10*3/uL (ref 0.0–0.1)
Basophils Relative: 0 %
Eosinophils Absolute: 0.2 10*3/uL (ref 0.0–0.5)
Eosinophils Relative: 8 %
HCT: 26.2 % — ABNORMAL LOW (ref 39.0–52.0)
Hemoglobin: 7.8 g/dL — ABNORMAL LOW (ref 13.0–17.0)
Immature Granulocytes: 0 %
Lymphocytes Relative: 17 %
Lymphs Abs: 0.4 10*3/uL — ABNORMAL LOW (ref 0.7–4.0)
MCH: 23.9 pg — ABNORMAL LOW (ref 26.0–34.0)
MCHC: 29.8 g/dL — ABNORMAL LOW (ref 30.0–36.0)
MCV: 80.1 fL (ref 80.0–100.0)
Monocytes Absolute: 0.3 10*3/uL (ref 0.1–1.0)
Monocytes Relative: 10 %
Neutro Abs: 1.7 10*3/uL (ref 1.7–7.7)
Neutrophils Relative %: 65 %
Platelets: 77 10*3/uL — ABNORMAL LOW (ref 150–400)
RBC: 3.27 MIL/uL — ABNORMAL LOW (ref 4.22–5.81)
RDW: 19.9 % — ABNORMAL HIGH (ref 11.5–15.5)
WBC: 2.6 10*3/uL — ABNORMAL LOW (ref 4.0–10.5)
nRBC: 0 % (ref 0.0–0.2)

## 2021-05-04 LAB — BASIC METABOLIC PANEL
Anion gap: 5 (ref 5–15)
BUN: 15 mg/dL (ref 8–23)
CO2: 22 mmol/L (ref 22–32)
Calcium: 8.4 mg/dL — ABNORMAL LOW (ref 8.9–10.3)
Chloride: 108 mmol/L (ref 98–111)
Creatinine, Ser: 1.02 mg/dL (ref 0.61–1.24)
GFR, Estimated: >60 mL/min (ref >60)
Glucose, Bld: 164 mg/dL — ABNORMAL HIGH (ref 70–99)
Potassium: 3.7 mmol/L (ref 3.5–5.1)
Sodium: 135 mmol/L (ref 135–145)

## 2021-05-04 LAB — METHYLMALONIC ACID, SERUM: Methylmalonic Acid, Quantitative: 148 nmol/L (ref 0–378)

## 2021-05-04 LAB — RETICULOCYTES
Immature Retic Fract: 13.3 % (ref 2.3–15.9)
RBC.: 3.23 MIL/uL — ABNORMAL LOW (ref 4.22–5.81)
Retic Count, Absolute: 41.3 10*3/uL (ref 19.0–186.0)
Retic Ct Pct: 1.3 % (ref 0.4–3.1)

## 2021-05-04 LAB — GLUCOSE, CAPILLARY
Glucose-Capillary: 105 mg/dL — ABNORMAL HIGH (ref 70–99)
Glucose-Capillary: 87 mg/dL (ref 70–99)
Glucose-Capillary: 104 mg/dL — ABNORMAL HIGH (ref 70–99)
Glucose-Capillary: 150 mg/dL — ABNORMAL HIGH (ref 70–99)
Glucose-Capillary: 175 mg/dL — ABNORMAL HIGH (ref 70–99)

## 2021-05-04 LAB — SURGICAL PATHOLOGY

## 2021-05-04 MED ORDER — FUROSEMIDE 40 MG PO TABS
40.0000 mg | ORAL_TABLET | Freq: Every day | ORAL | Status: DC
  Administered 2021-05-04: 40 mg via ORAL
  Filled 2021-05-04: qty 1

## 2021-05-04 MED ORDER — SODIUM CHLORIDE 0.9 % IV SOLN: 510.0000 mg | Freq: Once | INTRAVENOUS | Status: DC

## 2021-05-04 MED ORDER — FUROSEMIDE 10 MG/ML IJ SOLN
40.0000 mg | Freq: Every day | INTRAMUSCULAR | Status: DC
  Administered 2021-05-04 – 2021-05-05 (×2): 40 mg via INTRAVENOUS
  Filled 2021-05-04 (×2): qty 4

## 2021-05-04 MED ORDER — SODIUM CHLORIDE 0.9 % IV SOLN
250.0000 mg | Freq: Every day | INTRAVENOUS | Status: AC
  Administered 2021-05-04 – 2021-05-05 (×2): 250 mg via INTRAVENOUS
  Filled 2021-05-04 (×2): qty 20

## 2021-05-04 MED ORDER — FERROUS SULFATE 325 (65 FE) MG PO TABS
325.0000 mg | ORAL_TABLET | Freq: Every day | ORAL | Status: DC
  Administered 2021-05-05: 325 mg via ORAL
  Filled 2021-05-04: qty 1

## 2021-05-04 NOTE — Progress Notes (Signed)
[facility] Gastroenterology Progress Note  [name] 75 y.o. 1945-09-06  CC:  Acute on chronic iron deficiency anemia   Subjective: Patient reports feeling good today. Has had normal bowel movements, brown in color. No abdominal pain, nausea, vomiting, shortness of breath, or chest pain.  ROS : Review of Systems  Respiratory:  Negative for shortness of breath.   Cardiovascular:  Negative for chest pain and palpitations.  Gastrointestinal:  Negative for abdominal pain, blood in stool, constipation, diarrhea, heartburn, melena, nausea and vomiting.     Objective: Vital signs in last 24 hours: Vitals:   05/04/21 0700 05/04/21 0822  BP: (!) 132/58   Pulse: 66   Resp: 18   Temp: 97.7 F (36.5 C)   SpO2: 100% 100%    Physical Exam:  General:  Alert, cooperative, no distress, appears stated age  Head:  Normocephalic, without obvious abnormality, atraumatic  Eyes:  Anicteric sclera, EOM's intact  Lungs:   Clear to auscultation bilaterally, respirations unlabored  Heart:  Regular rate and rhythm, S1, S2 normal  Abdomen:   Soft, non-tender, bowel sounds active all four quadrants,  no masses,   Extremities: Moderate bilateral lower extremity edema    Lab Results: Recent Labs    05/03/21 0326 05/04/21 0910  NA 137 135  K 3.8 3.7  CL 108 108  CO2 23 22  GLUCOSE 125* 164*  BUN 12 15  CREATININE 0.86 1.02  CALCIUM 8.6* 8.4*   No results for input(s): AST, ALT, ALKPHOS, BILITOT, PROT, ALBUMIN in the last 72 hours. Recent Labs    05/03/21 0326 05/04/21 0910  WBC 1.9* 2.6*  NEUTROABS  --  1.7  HGB 7.4* 7.8*  HCT 24.3* 26.2*  MCV 79.7* 80.1  PLT 72* 77*   No results for input(s): LABPROT, INR in the last 72 hours.    Assessment Acute on chronic iron deficiency anemia: portal gastropathy vs diffuse GI tract AVMs - EGD 9/4: showed type 1 isolated gastric varices without bleeding. Multiple non-bleeding angioectasias in the stomach. Few small proximal avms. Overall, 15  gastric avms as well as mild-moderate portal gastropathy - Colonoscopy 9/4: single non-bleeding colonic angioectasia (clip placed), few non-bleeding colonic angioectasias, diverticulosis in sigmoid and descending colon. Ulcerated mucosa in rectum and recto-sigmoid colon. - HGB 7.8 (upward trending as of 7.4 yesterday), now having brown stools - BUN 15, Cr 1.02  Pancytopenia - low HGB, likely due to GI bleeding - thrombocytopenia likely due to cirrhosis - unknown cause of leukopenia  Cirrhosis with portal HTN  CHF  Diabetes Type II  Plan:  Await hematology consultation for pancytopenia  Plan to follow up outpatient in 2-4 weeks  Will likely need frequent outpatient iron and/or blood transfusions due to chronic anemia from diffuse AVMs in GI tract.   Okay to discharge from GI standpoint, [facility] will sign off. Please contact us if we can be of any further assistance during this hospital stay.   Nikki Glanzer Radford Pax PA-C 05/04/2021, 9:55 AM  Contact #  [phone]

## 2021-05-04 NOTE — Evaluation (Signed)
Physical Therapy Evaluation Patient Details Name: [name] MRN: [id number] DOB: 10-16-1945 Today's Date: 05/04/2021   History of Present Illness  75 yo male admitted with anemia. Hx of chronic anemia, liver cirrhosis, hepatic encephalopathy, CHF, DM, pancytopenia, CAD CABG  Clinical Impression  On eval, pt was Supv level for mobility. He walked ~200 feet with use of a RW. He tolerated activity well. Dyspnea 2/4. Mildly unsteady intermittently but no LOB. Recommend continued use of RW for ambulation safety. Will follow during hospital stay.     Follow Up Recommendations No PT follow up;Supervision - Intermittent    Equipment Recommendations  None recommended by PT    Recommendations for Other Services       Precautions / Restrictions Precautions Precautions: Fall Restrictions Weight Bearing Restrictions: No      Mobility  Bed Mobility               General bed mobility comments: sitting EOB    Transfers Overall transfer level: Needs assistance Equipment used: Rolling walker (2 wheeled) Transfers: Sit to/from Stand Sit to Stand: Supervision            Ambulation/Gait Ambulation/Gait assistance: Supervision Gait Distance (Feet): 200 Feet Assistive device: Rolling walker (2 wheeled) Gait Pattern/deviations: Step-through pattern;Decreased stride length;Decreased step length - right;Decreased step length - left     General Gait Details: Initially shuffle gait but progressed to taking better step lengths bilaterally as distance increased. tolerated distance well. dyspnea 2/4  Stairs            Wheelchair Mobility    Modified Rankin (Stroke Patients Only)       Balance Overall balance assessment: Needs assistance         Standing balance support: Bilateral upper extremity supported Standing balance-Leahy Scale: Fair                               Pertinent Vitals/Pain Pain Assessment: No/denies pain    Home Living  Family/patient expects to be discharged to:: Private residence Living Arrangements: Spouse/significant other;Children Available Help at Discharge: Family;Available 24 hours/day Type of Home: House Home Access: Stairs to enter Entrance Stairs-Rails: Left Entrance Stairs-Number of Steps: 1 Home Layout: Two level;Able to live on main level with bedroom/bathroom Home Equipment: Kasandra Knudsen - single point;Walker - 2 wheels;Shower seat;Bedside commode      Prior Function Level of Independence: Needs assistance   Gait / Transfers Assistance Needed: pt reports using cane most recently  ADL's / Homemaking Assistance Needed: Reports wife and son assist with med management and transportation. Reports independence with ADLs.        Hand Dominance        Extremity/Trunk Assessment   Upper Extremity Assessment Upper Extremity Assessment: Generalized weakness    Lower Extremity Assessment Lower Extremity Assessment: Generalized weakness (bil LE edema)       Communication   Communication: No difficulties  Cognition Arousal/Alertness: Awake/alert Behavior During Therapy: WFL for tasks assessed/performed Overall Cognitive Status: Within Functional Limits for tasks assessed                                        General Comments      Exercises     Assessment/Plan    PT Assessment Patient needs continued PT services  PT Problem List Decreased strength;Decreased mobility;Decreased activity tolerance;Decreased balance;Decreased knowledge of  use of DME       PT Treatment Interventions DME instruction;Gait training;Therapeutic exercise;Balance training;Stair training;Functional mobility training;Therapeutic activities;Patient/family education    PT Goals (Current goals can be found in the Care Plan section)  Acute Rehab PT Goals Patient Stated Goal: home PT Goal Formulation: With patient Time For Goal Achievement: 05/18/21 Potential to Achieve Goals: Good     Frequency Min 3X/week   Barriers to discharge        Co-evaluation               AM-PAC PT "6 Clicks" Mobility  Outcome Measure Help needed turning from your back to your side while in a flat bed without using bedrails?: None Help needed moving from lying on your back to sitting on the side of a flat bed without using bedrails?: None Help needed moving to and from a bed to a chair (including a wheelchair)?: A Little Help needed standing up from a chair using your arms (e.g., wheelchair or bedside chair)?: A Little Help needed to walk in hospital room?: A Little Help needed climbing 3-5 steps with a railing? : A Little 6 Click Score: 20    End of Session Equipment Utilized During Treatment: Gait belt Activity Tolerance: Patient tolerated treatment well Patient left: in bed;with call bell/phone within reach;with bed alarm set   PT Visit Diagnosis: Unsteadiness on feet (R26.81);Muscle weakness (generalized) (M62.81)    Time: 8335-8251 PT Time Calculation (min) (ACUTE ONLY): 32 min   Charges:   PT Evaluation $PT Eval Low Complexity: 1 Low PT Treatments $Gait Training: 8-22 mins          Doreatha Massed, PT Acute Rehabilitation  Office: [phone] Pager: [phone]  ]

## 2021-05-04 NOTE — Progress Notes (Signed)
PROGRESS NOTE  [name]  DOB: Jan 09, 1946  PCP: [name], MD KCL:[id number]  DOA: 04/28/2021  LOS: 6 days  Hospital Day: 7   Chief Complaint  Patient presents with   Abdominal Distention   Shortness of Breath   Brief narrative: [name] is a 75 y.o. male with PMH significant for DM2, chronic diastolic CHF, COPD, liver cirrhosis, recurrent GI bleeding, history of pancytopenia including chronic anemia  Patient presented to the ED on 8/31 with complaint of shortness of breath, 3 to 4 days of progressively worsening orthopnea, lower leg edema as well as hematochezia Admitted to hospitalist service for further evaluation management See below for details  Subjective: Patient was seen and examined this morning.  Pleasant elderly Caucasian male.  Lying on bed.  Not in distress.  No new symptoms.  Assessment/Plan: Acute on chronic GI bleeding -Presented with hematochezia in the setting of chronic GI bleeding.  Patient had multiple EGDs and colonoscopies in the past. -On 9/4, patient underwent EGD and colonoscopy.  EGD showed portal hypertensive gastropathy, type I isolated gastric varices without bleeding, few duodenal AVMs.  Colonoscopy showed hemorrhoids, diverticulosis of the sigmoid colon and descending colon. -Hematochezia is likely because of diverticulosis or hemorrhoids. -No further bleeding at this time.  If continues to drop hemoglobin, may need capsule endoscopy as an outpatient per GI. -Continue Protonix. -Currently on soft diet.  Acute on chronic anemia Chronic iron deficiency anemia -Hemoglobin lowest at 7 this admission.  Total of 3 units of PRBCs transfused.  Hemoglobin chronically is between 7 and 8. -Ferritin level low at 11.  I would give IV iron while in the hospital.  And start oral iron. Recent Labs    05/06/20 0954 06/01/20 1414 09/10/20 1958 09/11/20 0220 01/07/21 0725 01/07/21 1753 04/29/21 0450 04/29/21 4944 04/29/21 2146  04/30/21 0413 05/01/21 0407 05/02/21 0348 05/03/21 0326 05/04/21 0910 05/04/21 0911  HGB 8.8*   < >  --    < > 6.2*   < > 7.2* 7.3*   < > 7.0* 8.4* 8.0* 7.4* 7.8*  --   MCV 80.5   < >  --    < > 77.5*   < > 79.8*  --   --  78.5* 79.8* 79.8* 79.7* 80.1  --   VITAMINB12  --   --  278  --  342  --   --   --   --   --   --   --   --   --   --   FOLATE  --   --  17.2  --  15.0  --   --  12.9  --   --   --   --   --   --   --   FERRITIN 32  --  6*  --  6*  --   --  11*  --   --   --   --   --   --   --   TIBC  --   --  583*  --  577*  --   --  442  --   --   --   --   --   --   --   IRON 40*  --  16*  --  56  --   --  19*  --   --   --   --   --   --   --   RETICCTPCT  --   --  2.0  --   --   --  0.9  --   --   --   --   --   --   --  1.3   < > = values in this interval not displayed.   Chronic pancytopenia -Both WBC count and platelets are low but stable at this time, not worsening.  Probably related to liver cirrhosis.  Reticulocyte count today is normal at 1.3.  Doubt any bone marrow etiology. -Hematology consult as an outpatient. Recent Labs  Lab 04/28/21 1843 04/29/21 0450 04/30/21 0413 05/01/21 0407 05/02/21 0348 05/03/21 0326 05/04/21 0910  WBC 2.2*   < > 2.3* 2.2* 2.5* 1.9* 2.6*  NEUTROABS 1.4*  --   --   --   --   --  1.7  HGB 5.9*   < > 7.0* 8.4* 8.0* 7.4* 7.8*  HCT 20.8*   < > 23.3* 27.6* 26.4* 24.3* 26.2*  MCV 78.2*   < > 78.5* 79.8* 79.8* 79.7* 80.1  PLT 107*   < > 83* 76* 76* 72* 77*   < > = values in this interval not displayed.   Liver cirrhosis with portal hypertension -Home meds include Lasix 40 mg daily, lactulose 30 mg 3 times daily, Aldactone 25 mg daily.  Patient is not taking Xifaxan as suggested. -Patient has big bilateral lower extremity edema.  I would keep him on Lasix IV 40 mg daily along with Aldactone 25 mg daily.  Continue lactulose.  Not on beta-blocker.  Heart rate in 60s. -Currently on IV Rocephin for SBP prophylaxis. -Has distended abdomen.   Attempt paracentesis.  Chronic diastolic CHF -Last echo from May 2022 with EF 60 to 19% grade 3 diastolic dysfunction. -cont lasix  Type 2 diabetes mellitus -A1c 5.7/31 -Home meds include metformin 850 mg twice daily -Currently on hold. -Blood sugar level controlled with sliding scale insulin and Accu-Cheks. Lab Results  Component Value Date   HGBA1C 5.0 03/28/2021   Recent Labs  Lab 05/03/21 1619 05/03/21 2353 05/04/21 0555 05/04/21 0804 05/04/21 1137  GLUCAP 167* 125* 105* 87 104*   Mobility: Encourage ambulation Code Status:   Code Status: Full Code  Nutritional status: Body mass index is 33.45 kg/m. Nutrition Problem: Increased nutrient needs Etiology: chronic illness Signs/Symptoms: estimated needs Diet:  Diet Order             DIET SOFT Room service appropriate? Yes; Fluid consistency: Thin  Diet effective now                  DVT prophylaxis:  SCDs Start: 04/29/21 0349 SCDs Start: 04/28/21 2158   Antimicrobials: IV Rocephin for SBP prophylaxis Fluid: None Consultants: GI Family Communication: None at bedside  Status is: Inpatient  Remains inpatient appropriate because: Needs IV diuresis  Dispo: The patient is from: Home              Anticipated d/c is to: Home in 1 to 2 days              Patient currently is not medically stable to d/c.   Difficult to place patient No     Infusions:   cefTRIAXone (ROCEPHIN)  IV 2 g (05/03/21 2244)   ferric gluconate (FERRLECIT) IVPB      Scheduled Meds:  (feeding supplement) PROSource Plus  30 mL Oral BID BM   feeding supplement  237 mL Oral Q24H   [START ON 05/05/2021] ferrous sulfate  325 mg Oral Q breakfast   furosemide  40 mg Intravenous Daily   insulin aspart  0-6 Units Subcutaneous TID WC   lactulose  30 g Oral TID   multivitamin with minerals  1 tablet Oral Daily   pantoprazole (PROTONIX) IV  40 mg Intravenous Q12H   spironolactone  25 mg Oral Daily   umeclidinium-vilanterol  1 puff  Inhalation Daily    Antimicrobials: Anti-infectives (From admission, onward)    Start     Dose/Rate Route Frequency Ordered Stop   04/28/21 2300  cefTRIAXone (ROCEPHIN) 2 g in sodium chloride 0.9 % 100 mL IVPB        2 g 200 mL/hr over 30 Minutes Intravenous Every 24 hours 04/28/21 2238         PRN meds: acetaminophen **OR** acetaminophen   Objective: Vitals:   05/04/21 0822 05/04/21 1407  BP:  (!) 112/55  Pulse:  69  Resp:  16  Temp:  98.5 F (36.9 C)  SpO2: 100% 100%    Intake/Output Summary (Last 24 hours) at 05/04/2021 1435 Last data filed at 05/04/2021 1322 Gross per 24 hour  Intake 240 ml  Output --  Net 240 ml   Filed Weights   05/02/21 0500 05/02/21 1004 05/04/21 0500  Weight: 100.1 kg 100.1 kg 99.8 kg   Weight change: -0.3 kg Body mass index is 33.45 kg/m.   Physical Exam: General exam: Pleasant, elderly Caucasian male.  Not in distress. Skin: No rashes, lesions or ulcers. HEENT: Atraumatic, normocephalic, no obvious bleeding Lungs: Clear to auscultation bilaterally CVS: Regular rate and rhythm, no murmur GI/Abd soft, nontender, distended, bowel sound present CNS: Alert, awake, slow to respond, oriented to place and person Psychiatry: Mood appropriate Extremities: 2+ bilateral pedal edema, no calf tenderness  Data Review: I have personally reviewed the laboratory data and studies available.  Recent Labs  Lab 04/28/21 1843 04/29/21 0450 04/30/21 0413 05/01/21 0407 05/02/21 0348 05/03/21 0326 05/04/21 0910  WBC 2.2*   < > 2.3* 2.2* 2.5* 1.9* 2.6*  NEUTROABS 1.4*  --   --   --   --   --  1.7  HGB 5.9*   < > 7.0* 8.4* 8.0* 7.4* 7.8*  HCT 20.8*   < > 23.3* 27.6* 26.4* 24.3* 26.2*  MCV 78.2*   < > 78.5* 79.8* 79.8* 79.7* 80.1  PLT 107*   < > 83* 76* 76* 72* 77*   < > = values in this interval not displayed.   Recent Labs  Lab 04/29/21 0450 04/30/21 0413 05/01/21 0407 05/02/21 0348 05/03/21 0326 05/04/21 0910  NA 143 133* 133* 136 137 135   K 3.6 4.2 3.8 4.0 3.8 3.7  CL 114* 105 105 109 108 108  CO2 22 23 21* 23 23 22   GLUCOSE 73 119* 73 89 125* 164*  BUN 14 16 14 12 12 15   CREATININE 0.74 1.01 0.85 0.77 0.86 1.02  CALCIUM 8.9 8.1* 8.1* 8.4* 8.6* 8.4*  MG 2.0  --   --   --   --   --   PHOS 3.4  --   --   --   --   --     F/u labs ordered Unresulted Labs (From admission, onward)     Start     Ordered   05/05/21 0500  CBC with Differential/Platelet  Daily,   R     Question:  Specimen collection method  Answer:  Lab=Lab collect   05/04/21 1432   05/05/21 2706  Basic metabolic panel  Daily,  R     Question:  Specimen collection method  Answer:  Lab=Lab collect   05/04/21 1432   04/28/21 2214  Methylmalonic acid, serum  Add-on,   AD        04/28/21 2213            Signed, [name], MD [facility] 05/04/2021

## 2021-05-04 NOTE — Consult Note (Signed)
[facility] Inpatient Consult   05/04/2021  [name] 16-Jan-1946 [id number]  [facility] Follow Up:  Spoke with patient at bedside. Explained chronic care management (CCM) services to patient and told him that his primary care office has a CCM team to work with patient. Notified patient that unsuccessful attempts were made by PCP CCM RN case manager to contact him at home. Patient states that calls may be made to his son, [name], to discuss care needs.   Plan: Continue to follow for progression and disposition.  Netta Cedars, MSN, [facility] Liaison Nurse Mobile Phone [phone]  Toll free office [phone]

## 2021-05-05 ENCOUNTER — Inpatient Hospital Stay (HOSPITAL_COMMUNITY): Payer: Medicare HMO

## 2021-05-05 LAB — CBC WITH DIFFERENTIAL/PLATELET
Abs Immature Granulocytes: 0 10*3/uL (ref 0.00–0.07)
Basophils Absolute: 0 10*3/uL (ref 0.0–0.1)
Basophils Relative: 0 %
Eosinophils Absolute: 0.1 10*3/uL (ref 0.0–0.5)
Eosinophils Relative: 5 %
HCT: 24.6 % — ABNORMAL LOW (ref 39.0–52.0)
Hemoglobin: 7.3 g/dL — ABNORMAL LOW (ref 13.0–17.0)
Immature Granulocytes: 0 %
Lymphocytes Relative: 13 %
Lymphs Abs: 0.3 10*3/uL — ABNORMAL LOW (ref 0.7–4.0)
MCH: 23.6 pg — ABNORMAL LOW (ref 26.0–34.0)
MCHC: 29.7 g/dL — ABNORMAL LOW (ref 30.0–36.0)
MCV: 79.6 fL — ABNORMAL LOW (ref 80.0–100.0)
Monocytes Absolute: 0.3 10*3/uL (ref 0.1–1.0)
Monocytes Relative: 12 %
Neutro Abs: 1.7 10*3/uL (ref 1.7–7.7)
Neutrophils Relative %: 70 %
Platelets: 66 10*3/uL — ABNORMAL LOW (ref 150–400)
RBC: 3.09 MIL/uL — ABNORMAL LOW (ref 4.22–5.81)
RDW: 19.8 % — ABNORMAL HIGH (ref 11.5–15.5)
WBC: 2.4 10*3/uL — ABNORMAL LOW (ref 4.0–10.5)
nRBC: 0 % (ref 0.0–0.2)

## 2021-05-05 LAB — BASIC METABOLIC PANEL
Anion gap: 9 (ref 5–15)
BUN: 16 mg/dL (ref 8–23)
CO2: 23 mmol/L (ref 22–32)
Calcium: 8.2 mg/dL — ABNORMAL LOW (ref 8.9–10.3)
Chloride: 102 mmol/L (ref 98–111)
Creatinine, Ser: 1 mg/dL (ref 0.61–1.24)
GFR, Estimated: >60 mL/min (ref >60)
Glucose, Bld: 98 mg/dL (ref 70–99)
Potassium: 3.5 mmol/L (ref 3.5–5.1)
Sodium: 134 mmol/L — ABNORMAL LOW (ref 135–145)

## 2021-05-05 LAB — GLUCOSE, CAPILLARY
Glucose-Capillary: 95 mg/dL (ref 70–99)
Glucose-Capillary: 121 mg/dL — ABNORMAL HIGH (ref 70–99)

## 2021-05-05 MED ORDER — FERROUS SULFATE 325 (65 FE) MG PO TABS: 325.0000 mg | ORAL_TABLET | Freq: Every day | ORAL | 0 refills | Status: AC

## 2021-05-05 MED ORDER — FUROSEMIDE 40 MG PO TABS: 40.0000 mg | ORAL_TABLET | Freq: Every day | ORAL | 0 refills | Status: DC

## 2021-05-05 NOTE — Plan of Care (Signed)

## 2021-05-05 NOTE — Progress Notes (Signed)
Patient discharged home with son.  AVS reviewed with both son and patient; both reported understanding.  Valuables and belongings sent with son.  Patient transported out via w/c.  Escorted per NT.  No s/s of distress noted upon his departure.

## 2021-05-05 NOTE — Discharge Summary (Signed)
Physician Discharge Summary  [name] PXT:[id number] DOB: 11/26/1945 DOA: 04/28/2021  PCP: [name], MD  Admit date: 04/28/2021 Discharge date: 05/05/2021  Admitted From: Home Discharge disposition: Home   Code Status: Full Code   Discharge Diagnosis:   Principal Problem:   Acute on chronic anemia Active Problems:   Type 2 diabetes with complication (HCC)   SOB (shortness of breath)   Acute on chronic diastolic CHF (congestive heart failure) ([place])   Pancytopenia ([place])   Acute upper GI bleed   Cirrhosis [facility])    Chief Complaint  Patient presents with   Abdominal Distention   Shortness of Breath   Brief narrative: [name] is a 75 y.o. male with PMH significant for DM2, chronic diastolic CHF, COPD, liver cirrhosis s/p TIPS in May 2022, recurrent GI bleeding, history of pancytopenia including chronic anemia.  Patient presented to the ED on 8/31 with complaint of shortness of breath, 3 to 4 days of progressively worsening orthopnea, lower leg edema as well as hematochezia Admitted to hospitalist service for further evaluation management See below for details  Subjective: Patient was seen and examined this morning.   Lying down in bed.  Not in distress.  No new symptoms.  Wants to go home.    Assessment/Plan: Acute on chronic GI bleeding -Presented with hematochezia in the setting of chronic GI bleeding.  Patient had multiple EGDs and colonoscopies in the past. -On 9/4, patient underwent EGD and colonoscopy.  EGD showed portal hypertensive gastropathy, type I isolated gastric varices without bleeding, few duodenal AVMs.  Colonoscopy showed hemorrhoids, diverticulosis of the sigmoid colon and descending colon. -Hematochezia is likely because of diverticulosis or hemorrhoids. -No further bleeding at this time.  If continues to drop hemoglobin, may need capsule endoscopy as an outpatient per GI. -Continue Protonix. -Currently on soft diet.  Acute on  chronic anemia Chronic iron deficiency anemia -Hemoglobin lowest at 7 this admission.  Total of 3 units of PRBCs transfused.  Hemoglobin chronically is between 7 and 8. -Ferritin level low at 11.  He was given 1 dose of IV Feraheme in the hospital.  He has been started on oral iron supplement.  Recent Labs    05/06/20 0954 06/01/20 1414 09/10/20 1958 09/11/20 0220 01/07/21 0725 01/07/21 1753 04/29/21 0450 04/29/21 6270 04/29/21 2146 05/01/21 0407 05/02/21 0348 05/03/21 0326 05/04/21 0910 05/04/21 0911 05/05/21 0352  HGB 8.8*   < >  --    < > 6.2*   < > 7.2* 7.3*   < > 8.4* 8.0* 7.4* 7.8*  --  7.3*  MCV 80.5   < >  --    < > 77.5*   < > 79.8*  --    < > 79.8* 79.8* 79.7* 80.1  --  79.6*  VITAMINB12  --   --  278  --  342  --   --   --   --   --   --   --   --   --   --   FOLATE  --   --  17.2  --  15.0  --   --  12.9  --   --   --   --   --   --   --   FERRITIN 32  --  6*  --  6*  --   --  11*  --   --   --   --   --   --   --  TIBC  --   --  583*  --  577*  --   --  442  --   --   --   --   --   --   --   IRON 40*  --  16*  --  56  --   --  19*  --   --   --   --   --   --   --   RETICCTPCT  --   --  2.0  --   --   --  0.9  --   --   --   --   --   --  1.3  --    < > = values in this interval not displayed.   Chronic pancytopenia -Both WBC count and platelets are low but stable at this time, not worsening.  Probably related to liver cirrhosis.  Reticulocyte count is normal at 1.3.  Doubt any bone marrow etiology. -Outpatient referral to hematology given. Recent Labs  Lab 04/28/21 1843 04/29/21 0450 05/01/21 0407 05/02/21 0348 05/03/21 0326 05/04/21 0910 05/05/21 0352  WBC 2.2*   < > 2.2* 2.5* 1.9* 2.6* 2.4*  NEUTROABS 1.4*  --   --   --   --  1.7 1.7  HGB 5.9*   < > 8.4* 8.0* 7.4* 7.8* 7.3*  HCT 20.8*   < > 27.6* 26.4* 24.3* 26.2* 24.6*  MCV 78.2*   < > 79.8* 79.8* 79.7* 80.1 79.6*  PLT 107*   < > 76* 76* 72* 77* 66*   < > = values in this interval not displayed.    Liver cirrhosis with portal hypertension S/p TIPS on May 2022 -Patient has bilateral pedal edema and was given IV Lasix.  Negative balance of 7 L this hospitalization.   -Home meds include Lasix 40 mg daily, lactulose 30 mg 3 times daily, Aldactone 50 mg daily.  -His heart rate is in 60s and he is not on beta-blocker.  Continue lactulose as before. -Ultrasound today 9/7 did not reveal any ascites. -Per radiology team, patient had TIPS procedure in May 2022 after which he did not follow-up with Dr. Serafina Royals.  Radiology has a plan to see him as an outpatient.  Chronic diastolic CHF -Last echo from May 2022 with EF 60 to 00% grade 3 diastolic dysfunction. -cont lasix  Type 2 diabetes mellitus -A1c 5 on 7/31 -Home meds include glimepiride 2 mg daily and metformin 850 mg twice daily.  Currently both of them are on hold.  His A1c is tightly controlled which puts him at risk for hypoglycemia especially in the setting of liver cirrhosis and impaired gluconeogenesis. -I do not think he needs glimepiride or metformin resumed postdischarge. Recent Labs  Lab 05/04/21 0804 05/04/21 1137 05/04/21 1647 05/04/21 1955 05/05/21 0734  GLUCAP 87 104* 150* 175* 95    Allergies as of 05/05/2021       Reactions   Fluzone Quadrivalent [influenza Vac Split Quad] Other (See Comments)   The patient stated, in 10/2020: "I am not taking any more flu shots. It liked to have killed me."        Medication List     STOP taking these medications    Anoro Ellipta 62.5-25 MCG/INH Aepb Generic drug: umeclidinium-vilanterol   glimepiride 2 MG tablet Commonly known as: AMARYL   metFORMIN 850 MG tablet Commonly known as: GLUCOPHAGE   rifaximin 550 MG Tabs tablet Commonly known as: Doreene Nest  TAKE these medications    Accu-Chek Softclix Lancets lancets USE AS DIRECTED TO TEST BLOOD SUGAR FOUR TIMES DAILY   albuterol 108 (90 Base) MCG/ACT inhaler Commonly known as: VENTOLIN HFA Inhale 2 puffs  into the lungs every 6 (six) hours as needed for wheezing or shortness of breath.   blood glucose meter kit and supplies Dispense based on patient and insurance preference. Use up to four times daily as directed. (FOR ICD-10 E10.9, E11.9).   ferrous sulfate 325 (65 FE) MG tablet Take 1 tablet (325 mg total) by mouth daily with breakfast. Start taking on: May 06, 2021   furosemide 40 MG tablet Commonly known as: LASIX Take 1 tablet (40 mg total) by mouth daily. What changed: See the new instructions.   lactulose 10 GM/15ML solution Commonly known as: CHRONULAC Take 45 mLs (30 g total) by mouth 3 (three) times daily.   nitroGLYCERIN 0.4 MG SL tablet Commonly known as: NITROSTAT Place 1 tablet (0.4 mg total) under the tongue every 5 (five) minutes as needed for chest pain.   pantoprazole 40 MG tablet Commonly known as: PROTONIX Take 1 tablet (40 mg total) by mouth daily.   spironolactone 50 MG tablet Commonly known as: ALDACTONE Take 1 tablet (50 mg total) by mouth daily.        Discharge Instructions:  Diet Recommendation:  Discharge Diet Orders (From admission, onward)     Start     Ordered   05/05/21 0000  Diet - low sodium heart healthy        05/05/21 1207   05/05/21 0000  Diet Carb Modified        05/05/21 1207              Follow with Primary MD [name], MD in 7 days   Get CBC/BMP checked in next visit within 1 week by PCP or SNF MD ( we routinely change or add medications that can affect your baseline labs and fluid status, therefore we recommend that you get the mentioned basic workup next visit with your PCP, your PCP may decide not to get them or add new tests based on their clinical decision)  On your next visit with your PCP, please Get Medicines reviewed and adjusted.  Please request your PCP  to go over all Hospital Tests and Procedure/Radiological results at the follow up, please get all Hospital records sent to your Prim MD by  signing hospital release before you go home.  Activity: As tolerated with Full fall precautions use walker/cane & assistance as needed  For Heart failure patients - Check your Weight same time everyday, if you gain over 2 pounds, or you develop in leg swelling, experience more shortness of breath or chest pain, call your Primary MD immediately. Follow Cardiac Low Salt Diet and 1.5 lit/day fluid restriction.  If you have smoked or chewed Tobacco in the last 2 yrs please stop smoking, stop any regular Alcohol  and or any Recreational drug use.  If you experience worsening of your admission symptoms, develop shortness of breath, life threatening emergency, suicidal or homicidal thoughts you must seek medical attention immediately by calling 911 or calling your MD immediately  if symptoms less severe.  You Must read complete instructions/literature along with all the possible adverse reactions/side effects for all the Medicines you take and that have been prescribed to you. Take any new Medicines after you have completely understood and accpet all the possible adverse reactions/side effects.   Do not  drive, operate heavy machinery, perform activities at heights, swimming or participation in water activities or provide baby sitting services if your were admitted for syncope or siezures until you have seen by Primary MD or a Neurologist and advised to do so again.  Do not drive when taking Pain medications.  Do not take more than prescribed Pain, Sleep and Anxiety Medications  Wear Seat belts while driving.   Please note You were cared for by a hospitalist during your hospital stay. If you have any questions about your discharge medications or the care you received while you were in the hospital after you are discharged, you can call the unit and asked to speak with the hospitalist on call if the hospitalist that took care of you is not available. Once you are discharged, your primary care physician  will handle any further medical issues. Please note that NO REFILLS for any discharge medications will be authorized once you are discharged, as it is imperative that you return to your primary care physician (or establish a relationship with a primary care physician if you do not have one) for your aftercare needs so that they can reassess your need for medications and monitor your lab values.    Follow ups:    Follow-up Information     [facility] Follow up.   Why: Needs to follow up with Dr. Serafina Royals for TIPS procedure 12/2020. A scheduler from our office will call after hospital discharge to arrange date/time. Contact information: [place] [address] [phone]         [name], MD Follow up.   Specialty: Internal Medicine Contact information: [place] [address] [phone]         Nahser, Wonda Cheng, MD .   Specialty: Cardiology Contact information: [address] [address] [place] [address] [phone]                 Wound care:     Discharge Exam:   Vitals:   05/05/21 0606 05/05/21 0747 05/05/21 0748 05/05/21 1048  BP: (!) 132/59   (!) 120/49  Pulse: 66   69  Resp: 16     Temp: 98.6 F (37 C)     TempSrc:      SpO2: 100% 98% 98%   Weight:      Height:        Body mass index is 33.29 kg/m.  General exam: Pleasant elderly Caucasian male.  Not in distress. Skin: No rashes, lesions or ulcers. HEENT: Atraumatic, normocephalic, no obvious bleeding Lungs: Clear to auscultation bilaterally CVS: Regular rate and rhythm, no murmur GI/Abd soft, nontender, nondistended, bowel sound present CNS: Alert, awake abound x3 Psychiatry: Mood appropriate Extremities: Improving bilateral pedal edema, no calf tenderness  Time coordinating discharge: 35 minutes   The results of significant diagnostics from this hospitalization (including imaging, microbiology, ancillary  and laboratory) are listed below for reference.    Procedures and Diagnostic Studies:   DG Chest 2 View  Result Date: 04/28/2021 CLINICAL DATA:  Dyspnea upon exertion. EXAM: CHEST - 2 VIEW COMPARISON:  March 28, 2021 FINDINGS: Multiple sternal wires and vascular clips are seen. Moderate severity diffusely increased interstitial lung markings are seen with associated prominence of the pulmonary vasculature. There is no evidence of a pleural effusion or pneumothorax. The cardiac silhouette is mildly enlarged and unchanged in size. There is marked severity calcification of the aortic arch. The visualized skeletal  structures are unremarkable. IMPRESSION: Findings consistent with moderate severity congestive heart failure. Electronically Signed   By: Virgina Norfolk M.D.   On: 04/28/2021 19:07   DG Chest Port 1 View  Result Date: 04/29/2021 CLINICAL DATA:  Shortness of breath EXAM: PORTABLE CHEST 1 VIEW COMPARISON:  04/28/2021 FINDINGS: Post CABG changes. Stable cardiomegaly. Atherosclerotic calcification of the aortic knob. Pulmonary vascular congestion. Slightly improving aeration of the lung bases. No new focal consolidation. No pleural effusion or pneumothorax. IMPRESSION: 1. Cardiomegaly with pulmonary vascular congestion. 2. Slightly improving aeration of the lung bases. Electronically Signed   By: Davina Poke D.O.   On: 04/29/2021 14:12     Labs:   Basic Metabolic Panel: Recent Labs  Lab 04/29/21 0450 04/30/21 0413 05/01/21 0407 05/02/21 0348 05/03/21 0326 05/04/21 0910 05/05/21 0352  NA 143   < > 133* 136 137 135 134*  K 3.6   < > 3.8 4.0 3.8 3.7 3.5  CL 114*   < > 105 109 108 108 102  CO2 22   < > 21* _0 GLUCOSE 73   < > 73 89 125* 164* 98  BUN 14   < > _1 CREATININE 0.74   < > 0.85 0.77 0.86 1.02 1.00  CALCIUM 8.9   < > 8.1* 8.4* 8.6* 8.4* 8.2*  MG 2.0  --   --   --   --   --   --   PHOS 3.4  --   --   --   --   --   --    < > = values in this  interval not displayed.   GFR Estimated Creatinine Clearance: 72.9 mL/min (by C-G formula based on SCr of 1 mg/dL). Liver Function Tests: Recent Labs  Lab 04/28/21 1843 04/29/21 0450  AST 29 26  ALT 21 21  ALKPHOS 127* 118  BILITOT 1.2 1.3*  PROT 6.9 7.0  ALBUMIN 3.2* 3.3*   No results for input(s): LIPASE, AMYLASE in the last 168 hours. Recent Labs  Lab 04/28/21 1843  AMMONIA 93*   Coagulation profile Recent Labs  Lab 04/29/21 0450  INR 1.3*    CBC: Recent Labs  Lab 04/28/21 1843 04/29/21 0450 05/01/21 0407 05/02/21 0348 05/03/21 0326 05/04/21 0910 05/05/21 0352  WBC 2.2*   < > 2.2* 2.5* 1.9* 2.6* 2.4*  NEUTROABS 1.4*  --   --   --   --  1.7 1.7  HGB 5.9*   < > 8.4* 8.0* 7.4* 7.8* 7.3*  HCT 20.8*   < > 27.6* 26.4* 24.3* 26.2* 24.6*  MCV 78.2*   < > 79.8* 79.8* 79.7* 80.1 79.6*  PLT 107*   < > 76* 76* 72* 77* 66*   < > = values in this interval not displayed.   Cardiac Enzymes: No results for input(s): CKTOTAL, CKMB, CKMBINDEX, TROPONINI in the last 168 hours. BNP: Invalid input(s): POCBNP CBG: Recent Labs  Lab 05/04/21 0804 05/04/21 1137 05/04/21 1647 05/04/21 1955 05/05/21 0734  GLUCAP 87 104* 150* 175* 95   D-Dimer No results for input(s): DDIMER in the last 72 hours. Hgb A1c No results for input(s): HGBA1C in the last 72 hours. Lipid Profile No results for input(s): CHOL, HDL, LDLCALC, TRIG, CHOLHDL, LDLDIRECT in the last 72 hours. Thyroid function studies No results for input(s): TSH, T4TOTAL, T3FREE, THYROIDAB in the last 72 hours.  Invalid input(s): FREET3 Anemia work up [employer]    05/04/21  7841  RETICCTPCT 1.3   Microbiology Recent Results (from the past 240 hour(s))  SARS CORONAVIRUS 2 (TAT 6-24 HRS) Nasopharyngeal Nasopharyngeal Swab     Status: None   Collection Time: 04/28/21  9:27 PM   Specimen: Nasopharyngeal Swab  Result Value Ref Range Status   SARS Coronavirus 2 NEGATIVE NEGATIVE Final    Comment:  (NOTE) SARS-CoV-2 target nucleic acids are NOT DETECTED.  The SARS-CoV-2 RNA is generally detectable in upper and lower respiratory specimens during the acute phase of infection. Negative results do not preclude SARS-CoV-2 infection, do not rule out co-infections with other pathogens, and should not be used as the sole basis for treatment or other patient management decisions. Negative results must be combined with clinical observations, patient history, and epidemiological information. The expected result is Negative.  Fact Sheet for Patients: SugarRoll.be  Fact Sheet for Healthcare Providers: https://www.woods-mathews.com/  This test is not yet approved or cleared by the [place] FDA and  has been authorized for detection and/or diagnosis of SARS-CoV-2 by FDA under an Emergency Use Authorization (EUA). This EUA will remain  in effect (meaning this test can be used) for the duration of the COVID-19 declaration under Se ction 564(b)(1) of the Act, 21 U.S.C. section 360bbb-3(b)(1), unless the authorization is terminated or revoked sooner.  Performed at [facility], [place] [address] [place], [place] [address]      Signed: [name]  [facility] 05/05/2021, 12:09 PM

## 2021-05-05 NOTE — Progress Notes (Signed)
Patient presented to [facility] Ultrasound for evaluation for paracentesis. Limited ultrasound examination of the abdomen revealed no ascites. No procedure performed and ultrasound images are saved in Epic. Dr. Serafina Royals made aware.  Of note, the patient underwent TIPS in May 2022 and never followed up with Dr. Serafina Royals. Per Dr. Malachi Carl request an order will be placed for the patient to follow up with [place] in our outpatient clinic.  Soyla Dryer, [place] [phone] 05/05/2021, 11:31 AM

## 2021-05-06 ENCOUNTER — Telehealth: Payer: Self-pay | Admitting: Hematology and Oncology

## 2021-05-06 NOTE — Telephone Encounter (Signed)
Scheduled appt per 9/7 referral. Pt's son is aware of appt date and time.

## 2021-05-10 ENCOUNTER — Other Ambulatory Visit: Payer: Medicare HMO

## 2021-05-10 ENCOUNTER — Telehealth: Payer: Self-pay | Admitting: Hematology and Oncology

## 2021-05-10 ENCOUNTER — Encounter: Payer: Medicare HMO | Admitting: Hematology and Oncology

## 2021-05-10 NOTE — Progress Notes (Deleted)
[place] NOTE  Patient Care Team: [name], MD as PCP - General (Internal Medicine) Nahser, Wonda Cheng, MD as PCP - Cardiology (Cardiology) Clance, Armando Reichert, MD as Consulting Physician (Pulmonary Disease) Greer Pickerel, MD as Consulting Physician (General Surgery) Wonda Horner, MD (Gastroenterology) Rana Snare, MD (Inactive) (Urology) Hayden Pedro, MD (Ophthalmology)  CHIEF COMPLAINTS/PURPOSE OF CONSULTATION:    ASSESSMENT & PLAN:   No problem-specific Assessment & Plan notes found for this encounter.  No orders of the defined types were placed in this encounter.    HISTORY OF PRESENTING ILLNESS:   [name] 75 y.o. male is here because of pancytopenia from liver cirrhosis    REVIEW OF SYSTEMS:   Constitutional: Denies fevers, chills or abnormal night sweats Eyes: Denies blurriness of vision, double vision or watery eyes Ears, nose, mouth, throat, and face: Denies mucositis or sore throat Respiratory: Denies cough, dyspnea or wheezes Cardiovascular: Denies palpitation, chest discomfort or lower extremity swelling Gastrointestinal:  Denies nausea, heartburn or change in bowel habits Skin: Denies abnormal skin rashes Lymphatics: Denies new lymphadenopathy or easy bruising Neurological:Denies numbness, tingling or new weaknesses Behavioral/Psych: Mood is stable, no new changes  All other systems were reviewed with the patient and are negative.  MEDICAL HISTORY:  Past Medical History:  Diagnosis Date   Arthritis    "joints tighten up sometimes" (03/27/2104)   Carcinoma of liver, hepatocellular ([place]) 06/30/2020   Chronic lower back pain    Coronary artery disease involving native coronary artery of native heart without angina pectoris    Severe left main disease at catheterization July 2015  CABG x3 with a LIMA to the LAD, SVG to the OM, SVG to the PDA on 03/31/14. EF 60% by cath.    Diastolic heart failure (HCC)    GERD  without esophagitis 08/04/2010   Hepatic cirrhosis ([place])    a. Dx 01/2014 - CT a/p    History of blood transfusion    "related to bleeding ulcers"   History of concussion    1976--  NO RESIDUAL   History of GI bleed    a. UGIB 07/2012;  b. 01/2014 admission with GIB/FOB stool req 1U prbc's->EGD showed portal gastropathy, barrett's esoph, and chronic active h. pylori gastritis.   History of gout    2007 &  2008  LEFT LEG-- NO ISSUE SINCE   Hyperlipidemia    Iron deficiency anemia    Kidney stones    OA (osteoarthritis of spine)    LOWER BACK--  INTERMITTANT LEFT LEG NUMBNESS   OSA (obstructive sleep apnea)    PULMOLOGIST-  DR CLANCE--  MODERATE OSA  STARTED CPAP 2012--  BUT CURRENTLY HAS NOT USED PAST 6 MONTHS   Phimosis    a. s/p circumcision 2015.   Type 2 diabetes mellitus ([place])    Unspecified essential hypertension     SURGICAL HISTORY: Past Surgical History:  Procedure Laterality Date   ANKLE FRACTURE SURGERY Right 1989   "plate put in"   APPENDECTOMY  05-16-2004   open   BIOPSY  01/08/2021   Procedure: BIOPSY;  Surgeon: Wilford Corner, MD;  Location: [facility];  Service: Endoscopy;;   BIOPSY  05/02/2021   Procedure: BIOPSY;  Surgeon: Arta Silence, MD;  Location: [facility];  Service: Endoscopy;;   CIRCUMCISION N/A 09/09/2013   Procedure: CIRCUMCISION ADULT;  Surgeon: Bernestine Amass, MD;  Location[facility];  Service: Urology;  Laterality: N/A;   COLECTOMY  05-16-2004  COLONOSCOPY N/A 05/02/2021   Procedure: COLONOSCOPY;  Surgeon: Arta Silence, MD;  Location: [facility];  Service: Endoscopy;  Laterality: N/A;   COLONOSCOPY WITH PROPOFOL N/A 11/20/2020   Procedure: COLONOSCOPY WITH PROPOFOL;  Surgeon: Ronald Lobo, MD;  Location: [facility];  Service: Endoscopy;  Laterality: N/A;   CORONARY ANGIOPLASTY WITH STENT PLACEMENT  06/28/2012  DR COOPER   PCI W/  X1 DES to [place]. LAD/  LM  40% OSTIAL & 50-60% DISTAL /  50% PROX LCX/  30-40% PROX RCA  & 50% MID RCA/   LVEF 65-70%   CORONARY ARTERY BYPASS GRAFT N/A 03/31/2014   Procedure: CORONARY ARTERY BYPASS GRAFTING (CABG) times 3 using left internal mammary artery and right saphenous vein.;  Surgeon: Melrose Nakayama, MD;  Location: [facility];  Service: Open Heart Surgery;  Laterality: N/A;   DOBUTAMINE STRESS ECHO  06-08-2012   MODERATE HYPOKINESIS/ ISCHEMIA MID INFERIOR WALL   ESOPHAGOGASTRODUODENOSCOPY  08/15/2012   Procedure: ESOPHAGOGASTRODUODENOSCOPY (EGD);  Surgeon: Wonda Horner, MD;  Location: [facility] ENDOSCOPY;  Service: Endoscopy;  Laterality: N/A;   ESOPHAGOGASTRODUODENOSCOPY N/A 02/17/2014   Procedure: ESOPHAGOGASTRODUODENOSCOPY (EGD);  Surgeon: Jeryl Columbia, MD;  Location: [facility] ENDOSCOPY;  Service: Endoscopy;  Laterality: N/A;   ESOPHAGOGASTRODUODENOSCOPY N/A 04/17/2020   Procedure: ESOPHAGOGASTRODUODENOSCOPY (EGD);  Surgeon: Ronnette Juniper, MD;  Location: [place];  Service: Gastroenterology;  Laterality: N/A;   ESOPHAGOGASTRODUODENOSCOPY N/A 09/11/2020   Procedure: ESOPHAGOGASTRODUODENOSCOPY (EGD);  Surgeon: Clarene Essex, MD;  Location: [facility];  Service: Endoscopy;  Laterality: N/A;   ESOPHAGOGASTRODUODENOSCOPY  10/09/2020   ESOPHAGOGASTRODUODENOSCOPY N/A 10/09/2020   Procedure: ESOPHAGOGASTRODUODENOSCOPY (EGD);  Surgeon: Otis Brace, MD;  Location: [facility] ENDOSCOPY;  Service: Gastroenterology;  Laterality: N/A;   ESOPHAGOGASTRODUODENOSCOPY N/A 01/08/2021   Procedure: ESOPHAGOGASTRODUODENOSCOPY (EGD);  Surgeon: Wilford Corner, MD;  Location: [facility];  Service: Endoscopy;  Laterality: N/A;   ESOPHAGOGASTRODUODENOSCOPY N/A 05/02/2021   Procedure: ESOPHAGOGASTRODUODENOSCOPY (EGD);  Surgeon: Arta Silence, MD;  Location: [facility];  Service: Endoscopy;  Laterality: N/A;   ESOPHAGOGASTRODUODENOSCOPY (EGD) WITH PROPOFOL N/A 09/30/2017   Procedure: ESOPHAGOGASTRODUODENOSCOPY (EGD) WITH PROPOFOL;  Surgeon: Wonda Horner, MD;  Location: [facility] ENDOSCOPY;  Service: Endoscopy;   Laterality: N/A;   ESOPHAGOGASTRODUODENOSCOPY (EGD) WITH PROPOFOL N/A 12/20/2017   Procedure: ESOPHAGOGASTRODUODENOSCOPY (EGD) WITH PROPOFOL;  Surgeon: Clarene Essex, MD;  Location: [place];  Service: Endoscopy;  Laterality: N/A;   ESOPHAGOGASTRODUODENOSCOPY (EGD) WITH PROPOFOL N/A 08/10/2020   Procedure: ESOPHAGOGASTRODUODENOSCOPY (EGD) WITH PROPOFOL;  Surgeon: Wilford Corner, MD;  Location: [place];  Service: Endoscopy;  Laterality: N/A;   ESOPHAGOGASTRODUODENOSCOPY (EGD) WITH PROPOFOL N/A 11/20/2020   Procedure: ESOPHAGOGASTRODUODENOSCOPY (EGD) WITH PROPOFOL;  Surgeon: Ronald Lobo, MD;  Location: [facility];  Service: Endoscopy;  Laterality: N/A;   ESOPHAGOGASTRODUODENOSCOPY (EGD) WITH PROPOFOL N/A 02/28/2021   Procedure: ESOPHAGOGASTRODUODENOSCOPY (EGD) WITH PROPOFOL;  Surgeon: Clarene Essex, MD;  Location: [facility];  Service: Endoscopy;  Laterality: N/A;   GIVENS CAPSULE STUDY N/A 11/20/2020   Procedure: GIVENS CAPSULE STUDY;  Surgeon: Ronald Lobo, MD;  Location: [facility];  Service: Endoscopy;  Laterality: N/A;   HEMOSTASIS CLIP PLACEMENT  05/02/2021   Procedure: HEMOSTASIS CLIP PLACEMENT;  Surgeon: Arta Silence, MD;  Location: [facility];  Service: Endoscopy;;   HERNIA REPAIR     INTRAOPERATIVE TRANSESOPHAGEAL ECHOCARDIOGRAM N/A 03/31/2014   Procedure: INTRAOPERATIVE TRANSESOPHAGEAL ECHOCARDIOGRAM;  Surgeon: Melrose Nakayama, MD;  Location: [place];  Service: Open Heart Surgery;  Laterality: N/A;   IR ANGIOGRAM SELECTIVE EACH ADDITIONAL VESSEL  06/16/2020   IR ANGIOGRAM SELECTIVE EACH ADDITIONAL VESSEL  06/16/2020   IR ANGIOGRAM SELECTIVE EACH ADDITIONAL VESSEL  06/16/2020   IR ANGIOGRAM SELECTIVE EACH ADDITIONAL VESSEL  06/16/2020   IR ANGIOGRAM SELECTIVE EACH ADDITIONAL VESSEL  06/16/2020   IR ANGIOGRAM SELECTIVE EACH ADDITIONAL VESSEL  06/16/2020   IR ANGIOGRAM SELECTIVE EACH ADDITIONAL VESSEL  06/16/2020   IR ANGIOGRAM SELECTIVE EACH ADDITIONAL VESSEL   06/16/2020   IR ANGIOGRAM SELECTIVE EACH ADDITIONAL VESSEL  06/16/2020   IR ANGIOGRAM SELECTIVE EACH ADDITIONAL VESSEL  06/30/2020   IR ANGIOGRAM SELECTIVE EACH ADDITIONAL VESSEL  06/30/2020   IR ANGIOGRAM VISCERAL SELECTIVE  06/16/2020   IR ANGIOGRAM VISCERAL SELECTIVE  06/30/2020   IR EMBO ARTERIAL NOT HEMORR HEMANG INC GUIDE ROADMAPPING  06/16/2020   IR EMBO TUMOR ORGAN ISCHEMIA INFARCT INC GUIDE ROADMAPPING  06/30/2020   IR EMBO VENOUS NOT HEMORR HEMANG  INC GUIDE ROADMAPPING  01/13/2021   IR IVUS EACH ADDITIONAL NON CORONARY VESSEL  01/13/2021   IR RADIOLOGIST EVAL & MGMT  05/06/2020   IR RADIOLOGIST EVAL & MGMT  05/20/2020   IR TIPS  01/13/2021   IR US GUIDE VASC ACCESS RIGHT  06/16/2020   IR US GUIDE VASC ACCESS RIGHT  06/30/2020   IR US GUIDE VASC ACCESS RIGHT  01/13/2021   LAPAROSCOPIC UMBILICAL HERNIA REPAIR W/ MESH  06-06-2011   LEFT HEART CATHETERIZATION WITH CORONARY ANGIOGRAM N/A 03/28/2014   Procedure: LEFT HEART CATHETERIZATION WITH CORONARY ANGIOGRAM;  Surgeon: Sinclair Grooms, MD;  Location: [facility] CATH LAB;  Service: Cardiovascular;  Laterality: N/A;   LIPOMA EXCISION Left 08/25/2017   Procedure: EXCISION LIPOMA LEFT POSTERIOR THIGH;  Surgeon: Clovis Riley, MD;  Location: [place];  Service: General;  Laterality: Left;   NEPHROLITHOTOMY  1990'S   OPEN APPENDECTOMY W/ PARTIAL CECECTOMY  05-16-2004   PERCUTANEOUS CORONARY STENT INTERVENTION (PCI-S) N/A 06/28/2012   Procedure: PERCUTANEOUS CORONARY STENT INTERVENTION (PCI-S);  Surgeon: Sherren Mocha, MD;  Location: [facility] CATH LAB;  Service: Cardiovascular;  Laterality: N/A;   POLYPECTOMY  11/20/2020   Procedure: POLYPECTOMY;  Surgeon: Ronald Lobo, MD;  Location: [facility];  Service: Endoscopy;;   POLYPECTOMY  05/02/2021   Procedure: POLYPECTOMY;  Surgeon: Arta Silence, MD;  Location: [facility];  Service: Endoscopy;;   RADIOLOGY WITH ANESTHESIA N/A 01/13/2021   Procedure: IR WITH ANESTHESIA - TIPS;  Surgeon: Suzette Battiest,  MD;  Location: [place];  Service: Radiology;  Laterality: N/A;    SOCIAL HISTORY: Social History   Socioeconomic History   Marital status: Married    Spouse name: Not on file   Number of children: Not on file   Years of education: Not on file   Highest education level: Not on file  Occupational History   Not on file  Tobacco Use   Smoking status: Former    Packs/day: 2.00    Years: 40.00    Pack years: 80.00    Types: Cigarettes    Quit date: 08/29/1996    Years since quitting: 24.7   Smokeless tobacco: Never  Vaping Use   Vaping Use: Never used  Substance and Sexual Activity   Alcohol use: Not Currently    Alcohol/week: 8.0 standard drinks    Types: 8 Cans of beer per week    Comment: 2 beers every other day   Drug use: No   Sexual activity: Yes  Other Topics Concern   Not on file  Social History Narrative   Lives in [place] with wife, son, and son's family.   Social Determinants of Health  Financial Resource Strain: Medium Risk   Difficulty of Paying Living Expenses: Somewhat hard  Food Insecurity: No Food Insecurity   Worried About Charity fundraiser in the Last Year: Never true   Ran Out of Food in the Last Year: Never true  Transportation Needs: No Transportation Needs   Lack of Transportation (Medical): No   Lack of Transportation (Non-Medical): No  Physical Activity: Not on file  Stress: Not on file  Social Connections: Not on file  Intimate Partner Violence: Not on file    FAMILY HISTORY: Family History  Problem Relation Age of Onset   Lung cancer Sister    Cancer Sister        lung   Cancer Mother    Cancer Father        died in his 109s.   Cancer Brother        lung   Coronary artery disease Other    Diabetes Other    Colon cancer Other    Cancer Sister        lung    ALLERGIES:  is allergic to fluzone quadrivalent [influenza vac split quad].  MEDICATIONS:  Current Outpatient Medications  Medication Sig Dispense Refill   Accu-Chek  Softclix Lancets lancets USE AS DIRECTED TO TEST BLOOD SUGAR FOUR TIMES DAILY 300 each 3   albuterol (VENTOLIN HFA) 108 (90 Base) MCG/ACT inhaler Inhale 2 puffs into the lungs every 6 (six) hours as needed for wheezing or shortness of breath. 8 g 0   blood glucose meter kit and supplies Dispense based on patient and insurance preference. Use up to four times daily as directed. (FOR ICD-10 E10.9, E11.9). 1 each 0   ferrous sulfate 325 (65 FE) MG tablet Take 1 tablet (325 mg total) by mouth daily with breakfast. 90 tablet 0   furosemide (LASIX) 40 MG tablet Take 1 tablet (40 mg total) by mouth daily. 90 tablet 0   lactulose (CHRONULAC) 10 GM/15ML solution Take 45 mLs (30 g total) by mouth 3 (three) times daily. 1892 mL 1   nitroGLYCERIN (NITROSTAT) 0.4 MG SL tablet Place 1 tablet (0.4 mg total) under the tongue every 5 (five) minutes as needed for chest pain. 25 tablet 6   pantoprazole (PROTONIX) 40 MG tablet Take 1 tablet (40 mg total) by mouth daily. 30 tablet 0   spironolactone (ALDACTONE) 50 MG tablet Take 1 tablet (50 mg total) by mouth daily. 30 tablet 1   No current facility-administered medications for this visit.     PHYSICAL EXAMINATION: ECOG PERFORMANCE STATUS: {CHL ONC ECOG [phone]}  There were no vitals filed for this visit. There were no vitals filed for this visit.  GENERAL:alert, no distress and comfortable SKIN: skin color, texture, turgor are normal, no rashes or significant lesions EYES: normal, conjunctiva are pink and non-injected, sclera clear OROPHARYNX:no exudate, no erythema and lips, buccal mucosa, and tongue normal  NECK: supple, thyroid normal size, non-tender, without nodularity LYMPH:  no palpable lymphadenopathy in the cervical, axillary or inguinal LUNGS: clear to auscultation and percussion with normal breathing effort HEART: regular rate & rhythm and no murmurs and no lower extremity edema ABDOMEN:abdomen soft, non-tender and normal bowel  sounds Musculoskeletal:no cyanosis of digits and no clubbing  PSYCH: alert & oriented x 3 with fluent speech NEURO: no focal motor/sensory deficits  LABORATORY DATA:  I have reviewed the data as listed Lab Results  Component Value Date   WBC 2.4 (L) 05/05/2021   HGB 7.3 (L) 05/05/2021  HCT 24.6 (L) 05/05/2021   MCV 79.6 (L) 05/05/2021   PLT 66 (L) 05/05/2021     Chemistry      Component Value Date/Time   NA 134 (L) 05/05/2021 0352   NA 139 08/25/2020 1420   K 3.5 05/05/2021 0352   CL 102 05/05/2021 0352   CO2 23 05/05/2021 0352   BUN 16 05/05/2021 0352   BUN 26 08/25/2020 1420   CREATININE 1.00 05/05/2021 0352   CREATININE 0.75 06/09/2015 1609      Component Value Date/Time   CALCIUM 8.2 (L) 05/05/2021 0352   ALKPHOS 118 04/29/2021 0450   AST 26 04/29/2021 0450   ALT 21 04/29/2021 0450   BILITOT 1.3 (H) 04/29/2021 0450       RADIOGRAPHIC STUDIES: I have personally reviewed the radiological images as listed and agreed with the findings in the report. DG Chest 2 View  Result Date: 04/28/2021 CLINICAL DATA:  Dyspnea upon exertion. EXAM: CHEST - 2 VIEW COMPARISON:  March 28, 2021 FINDINGS: Multiple sternal wires and vascular clips are seen. Moderate severity diffusely increased interstitial lung markings are seen with associated prominence of the pulmonary vasculature. There is no evidence of a pleural effusion or pneumothorax. The cardiac silhouette is mildly enlarged and unchanged in size. There is marked severity calcification of the aortic arch. The visualized skeletal structures are unremarkable. IMPRESSION: Findings consistent with moderate severity congestive heart failure. Electronically Signed   By: Virgina Norfolk M.D.   On: 04/28/2021 19:07   US Abdomen Limited  Result Date: 05/05/2021 CLINICAL DATA:  Assess for ascites EXAM: LIMITED ABDOMEN ULTRASOUND FOR ASCITES TECHNIQUE: Limited ultrasound survey for ascites was performed in all four abdominal quadrants.  COMPARISON:  CT 01/29/2021 FINDINGS: Limited ultrasound of the 4 quadrants performed. Shadowing from bowel gas. No significant ascites is seen IMPRESSION: No significant ascites is visualized. Electronically Signed   By: Donavan Foil M.D.   On: 05/05/2021 15:43   DG Chest Port 1 View  Result Date: 04/29/2021 CLINICAL DATA:  Shortness of breath EXAM: PORTABLE CHEST 1 VIEW COMPARISON:  04/28/2021 FINDINGS: Post CABG changes. Stable cardiomegaly. Atherosclerotic calcification of the aortic knob. Pulmonary vascular congestion. Slightly improving aeration of the lung bases. No new focal consolidation. No pleural effusion or pneumothorax. IMPRESSION: 1. Cardiomegaly with pulmonary vascular congestion. 2. Slightly improving aeration of the lung bases. Electronically Signed   By: Davina Poke D.O.   On: 04/29/2021 14:12    All questions were answered. The patient knows to call the clinic with any problems, questions or concerns. I spent *** minutes in the care of this patient including H and P, review of records, counseling and coordination of care.     Benay Pike, MD 05/10/2021 10:43 AM

## 2021-05-10 NOTE — Telephone Encounter (Signed)
R/s missed appt per sch msg. Called, not able to leave msg

## 2021-05-20 ENCOUNTER — Inpatient Hospital Stay: Payer: Medicare HMO | Attending: Hematology and Oncology | Admitting: Hematology and Oncology

## 2021-05-20 ENCOUNTER — Other Ambulatory Visit: Payer: Self-pay

## 2021-05-20 ENCOUNTER — Encounter: Payer: Self-pay | Admitting: Hematology and Oncology

## 2021-05-20 ENCOUNTER — Telehealth: Payer: Self-pay

## 2021-05-20 ENCOUNTER — Inpatient Hospital Stay: Payer: Medicare HMO

## 2021-05-20 VITALS — BP 131/54 | HR 79 | Temp 98.9°F | Resp 19 | Ht 68.0 in | Wt 228.1 lb

## 2021-05-20 DIAGNOSIS — Z801 Family history of malignant neoplasm of trachea, bronchus and lung: Secondary | ICD-10-CM | POA: Diagnosis not present

## 2021-05-20 DIAGNOSIS — Z79899 Other long term (current) drug therapy: Secondary | ICD-10-CM | POA: Insufficient documentation

## 2021-05-20 DIAGNOSIS — D61818 Other pancytopenia: Secondary | ICD-10-CM

## 2021-05-20 DIAGNOSIS — K7460 Unspecified cirrhosis of liver: Secondary | ICD-10-CM | POA: Diagnosis not present

## 2021-05-20 DIAGNOSIS — Z800 Family history of malignant neoplasm of digestive organs: Secondary | ICD-10-CM | POA: Diagnosis not present

## 2021-05-20 DIAGNOSIS — Z87891 Personal history of nicotine dependence: Secondary | ICD-10-CM | POA: Diagnosis not present

## 2021-05-20 DIAGNOSIS — R188 Other ascites: Secondary | ICD-10-CM

## 2021-05-20 DIAGNOSIS — Z809 Family history of malignant neoplasm, unspecified: Secondary | ICD-10-CM | POA: Diagnosis not present

## 2021-05-20 LAB — CMP (CANCER CENTER ONLY)
ALT: 29 U/L (ref 0–44)
AST: 43 U/L — ABNORMAL HIGH (ref 15–41)
Albumin: 2.9 g/dL — ABNORMAL LOW (ref 3.5–5.0)
Alkaline Phosphatase: 222 U/L — ABNORMAL HIGH (ref 38–126)
Anion gap: 8 (ref 5–15)
BUN: 18 mg/dL (ref 8–23)
CO2: 23 mmol/L (ref 22–32)
Calcium: 8.7 mg/dL — ABNORMAL LOW (ref 8.9–10.3)
Chloride: 107 mmol/L (ref 98–111)
Creatinine: 1.13 mg/dL (ref 0.61–1.24)
GFR, Estimated: >60 mL/min (ref >60)
Glucose, Bld: 170 mg/dL — ABNORMAL HIGH (ref 70–99)
Potassium: 3.8 mmol/L (ref 3.5–5.1)
Sodium: 138 mmol/L (ref 135–145)
Total Bilirubin: 0.9 mg/dL (ref 0.3–1.2)
Total Protein: 6.6 g/dL (ref 6.5–8.1)

## 2021-05-20 LAB — CBC WITH DIFFERENTIAL/PLATELET
Abs Immature Granulocytes: 0.01 10*3/uL (ref 0.00–0.07)
Basophils Absolute: 0 10*3/uL (ref 0.0–0.1)
Basophils Relative: 0 %
Eosinophils Absolute: 0.1 10*3/uL (ref 0.0–0.5)
Eosinophils Relative: 4 %
HCT: 23 % — ABNORMAL LOW (ref 39.0–52.0)
Hemoglobin: 6.8 g/dL — CL (ref 13.0–17.0)
Immature Granulocytes: 0 %
Lymphocytes Relative: 16 %
Lymphs Abs: 0.4 10*3/uL — ABNORMAL LOW (ref 0.7–4.0)
MCH: 25.5 pg — ABNORMAL LOW (ref 26.0–34.0)
MCHC: 29.6 g/dL — ABNORMAL LOW (ref 30.0–36.0)
MCV: 86.1 fL (ref 80.0–100.0)
Monocytes Absolute: 0.2 10*3/uL (ref 0.1–1.0)
Monocytes Relative: 9 %
Neutro Abs: 1.7 10*3/uL (ref 1.7–7.7)
Neutrophils Relative %: 71 %
Platelets: 167 10*3/uL (ref 150–400)
RBC: 2.67 MIL/uL — ABNORMAL LOW (ref 4.22–5.81)
RDW: 25.6 % — ABNORMAL HIGH (ref 11.5–15.5)
WBC: 2.4 10*3/uL — ABNORMAL LOW (ref 4.0–10.5)
nRBC: 0 % (ref 0.0–0.2)

## 2021-05-20 LAB — RETICULOCYTES
Immature Retic Fract: 28.1 % — ABNORMAL HIGH (ref 2.3–15.9)
RBC.: 2.67 MIL/uL — ABNORMAL LOW (ref 4.22–5.81)
Retic Count, Absolute: 71 10*3/uL (ref 19.0–186.0)
Retic Ct Pct: 2.7 % (ref 0.4–3.1)

## 2021-05-20 LAB — PROTIME-INR
INR: 1.3 — ABNORMAL HIGH (ref 0.8–1.2)
Prothrombin Time: 16.1 seconds — ABNORMAL HIGH (ref 11.4–15.2)

## 2021-05-20 LAB — LACTATE DEHYDROGENASE: LDH: 215 U/L — ABNORMAL HIGH (ref 98–192)

## 2021-05-20 LAB — VITAMIN B12: Vitamin B-12: 386 pg/mL (ref 180–914)

## 2021-05-20 NOTE — Progress Notes (Deleted)
[place] NOTE  Patient Care Team: [name], MD as PCP - General (Internal Medicine) Nahser, Wonda Cheng, MD as PCP - Cardiology (Cardiology) Clance, Armando Reichert, MD as Consulting Physician (Pulmonary Disease) Greer Pickerel, MD as Consulting Physician (General Surgery) Wonda Horner, MD (Gastroenterology) Rana Snare, MD (Inactive) (Urology) Hayden Pedro, MD (Ophthalmology)  CHIEF COMPLAINTS/PURPOSE OF CONSULTATION:    ASSESSMENT & PLAN:   No problem-specific Assessment & Plan notes found for this encounter.  No orders of the defined types were placed in this encounter.    HISTORY OF PRESENTING ILLNESS:   [name] 75 y.o. male is here because of pancytopenia from liver cirrhosis    REVIEW OF SYSTEMS:   Constitutional: Denies fevers, chills or abnormal night sweats Eyes: Denies blurriness of vision, double vision or watery eyes Ears, nose, mouth, throat, and face: Denies mucositis or sore throat Respiratory: Denies cough, dyspnea or wheezes Cardiovascular: Denies palpitation, chest discomfort or lower extremity swelling Gastrointestinal:  Denies nausea, heartburn or change in bowel habits Skin: Denies abnormal skin rashes Lymphatics: Denies new lymphadenopathy or easy bruising Neurological:Denies numbness, tingling or new weaknesses Behavioral/Psych: Mood is stable, no new changes  All other systems were reviewed with the patient and are negative.  MEDICAL HISTORY:  Past Medical History:  Diagnosis Date   Arthritis    "joints tighten up sometimes" (03/27/2104)   Carcinoma of liver, hepatocellular ([place]) 06/30/2020   Chronic lower back pain    Coronary artery disease involving native coronary artery of native heart without angina pectoris    Severe left main disease at catheterization July 2015  CABG x3 with a LIMA to the LAD, SVG to the OM, SVG to the PDA on 03/31/14. EF 60% by cath.    Diastolic heart failure (HCC)    GERD  without esophagitis 08/04/2010   Hepatic cirrhosis ([place])    a. Dx 01/2014 - CT a/p    History of blood transfusion    "related to bleeding ulcers"   History of concussion    1976--  NO RESIDUAL   History of GI bleed    a. UGIB 07/2012;  b. 01/2014 admission with GIB/FOB stool req 1U prbc's->EGD showed portal gastropathy, barrett's esoph, and chronic active h. pylori gastritis.   History of gout    2007 &  2008  LEFT LEG-- NO ISSUE SINCE   Hyperlipidemia    Iron deficiency anemia    Kidney stones    OA (osteoarthritis of spine)    LOWER BACK--  INTERMITTANT LEFT LEG NUMBNESS   OSA (obstructive sleep apnea)    PULMOLOGIST-  DR CLANCE--  MODERATE OSA  STARTED CPAP 2012--  BUT CURRENTLY HAS NOT USED PAST 6 MONTHS   Phimosis    a. s/p circumcision 2015.   Type 2 diabetes mellitus ([place])    Unspecified essential hypertension     SURGICAL HISTORY: Past Surgical History:  Procedure Laterality Date   ANKLE FRACTURE SURGERY Right 1989   "plate put in"   APPENDECTOMY  05-16-2004   open   BIOPSY  01/08/2021   Procedure: BIOPSY;  Surgeon: Wilford Corner, MD;  Location: [facility];  Service: Endoscopy;;   BIOPSY  05/02/2021   Procedure: BIOPSY;  Surgeon: Arta Silence, MD;  Location: [facility];  Service: Endoscopy;;   CIRCUMCISION N/A 09/09/2013   Procedure: CIRCUMCISION ADULT;  Surgeon: Bernestine Amass, MD;  Location[facility];  Service: Urology;  Laterality: N/A;   COLECTOMY  05-16-2004  COLONOSCOPY N/A 05/02/2021   Procedure: COLONOSCOPY;  Surgeon: Arta Silence, MD;  Location: [facility];  Service: Endoscopy;  Laterality: N/A;   COLONOSCOPY WITH PROPOFOL N/A 11/20/2020   Procedure: COLONOSCOPY WITH PROPOFOL;  Surgeon: Ronald Lobo, MD;  Location: [facility];  Service: Endoscopy;  Laterality: N/A;   CORONARY ANGIOPLASTY WITH STENT PLACEMENT  06/28/2012  DR COOPER   PCI W/  X1 DES to [place]. LAD/  LM  40% OSTIAL & 50-60% DISTAL /  50% PROX LCX/  30-40% PROX RCA  & 50% MID RCA/   LVEF 65-70%   CORONARY ARTERY BYPASS GRAFT N/A 03/31/2014   Procedure: CORONARY ARTERY BYPASS GRAFTING (CABG) times 3 using left internal mammary artery and right saphenous vein.;  Surgeon: Melrose Nakayama, MD;  Location: [place];  Service: Open Heart Surgery;  Laterality: N/A;   DOBUTAMINE STRESS ECHO  06-08-2012   MODERATE HYPOKINESIS/ ISCHEMIA MID INFERIOR WALL   ESOPHAGOGASTRODUODENOSCOPY  08/15/2012   Procedure: ESOPHAGOGASTRODUODENOSCOPY (EGD);  Surgeon: Wonda Horner, MD;  Location: [facility];  Service: Endoscopy;  Laterality: N/A;   ESOPHAGOGASTRODUODENOSCOPY N/A 02/17/2014   Procedure: ESOPHAGOGASTRODUODENOSCOPY (EGD);  Surgeon: Jeryl Columbia, MD;  Location: [facility]COPY;  Service: Endoscopy;  Laterality: N/A;   ESOPHAGOGASTRODUODENOSCOPY N/A 04/17/2020   Procedure: ESOPHAGOGASTRODUODENOSCOPY (EGD);  Surgeon: Ronnette Juniper, MD;  Location: [place];  Service: Gastroenterology;  Laterality: N/A;   ESOPHAGOGASTRODUODENOSCOPY N/A 09/11/2020   Procedure: ESOPHAGOGASTRODUODENOSCOPY (EGD);  Surgeon: Clarene Essex, MD;  Location: [facility];  Service: Endoscopy;  Laterality: N/A;   ESOPHAGOGASTRODUODENOSCOPY  10/09/2020   ESOPHAGOGASTRODUODENOSCOPY N/A 10/09/2020   Procedure: ESOPHAGOGASTRODUODENOSCOPY (EGD);  Surgeon: Otis Brace, MD;  Location: [facility] ENDOSCOPY;  Service: Gastroenterology;  Laterality: N/A;   ESOPHAGOGASTRODUODENOSCOPY N/A 01/08/2021   Procedure: ESOPHAGOGASTRODUODENOSCOPY (EGD);  Surgeon: Wilford Corner, MD;  Location: [facility];  Service: Endoscopy;  Laterality: N/A;   ESOPHAGOGASTRODUODENOSCOPY N/A 05/02/2021   Procedure: ESOPHAGOGASTRODUODENOSCOPY (EGD);  Surgeon: Arta Silence, MD;  Location: [facility];  Service: Endoscopy;  Laterality: N/A;   ESOPHAGOGASTRODUODENOSCOPY (EGD) WITH PROPOFOL N/A 09/30/2017   Procedure: ESOPHAGOGASTRODUODENOSCOPY (EGD) WITH PROPOFOL;  Surgeon: Wonda Horner, MD;  Location: [facility] ENDOSCOPY;  Service: Endoscopy;   Laterality: N/A;   ESOPHAGOGASTRODUODENOSCOPY (EGD) WITH PROPOFOL N/A 12/20/2017   Procedure: ESOPHAGOGASTRODUODENOSCOPY (EGD) WITH PROPOFOL;  Surgeon: Clarene Essex, MD;  Location: [place];  Service: Endoscopy;  Laterality: N/A;   ESOPHAGOGASTRODUODENOSCOPY (EGD) WITH PROPOFOL N/A 08/10/2020   Procedure: ESOPHAGOGASTRODUODENOSCOPY (EGD) WITH PROPOFOL;  Surgeon: Wilford Corner, MD;  Location: [place];  Service: Endoscopy;  Laterality: N/A;   ESOPHAGOGASTRODUODENOSCOPY (EGD) WITH PROPOFOL N/A 11/20/2020   Procedure: ESOPHAGOGASTRODUODENOSCOPY (EGD) WITH PROPOFOL;  Surgeon: Ronald Lobo, MD;  [facility];  Service: Endoscopy;  Laterality: N/A;   ESOPHAGOGASTRODUODENOSCOPY (EGD) WITH PROPOFOL N/A 02/28/2021   Procedure: ESOPHAGOGASTRODUODENOSCOPY (EGD) WITH PROPOFOL;  Surgeon: Clarene Essex, MD;  [facility]: [facility];  Service: Endoscopy;  Laterality: N/A;   GIVENS CAPSULE STUDY N/A 11/20/2020   Procedure: GIVENS CAPSULE STUDY;  Surgeon: Ronald Lobo, MD;  Location: [facility];  Service: Endoscopy;  Laterality: N/A;   HEMOSTASIS CLIP PLACEMENT  05/02/2021   Procedure: HEMOSTASIS CLIP PLACEMENT;  Surgeon: Arta Silence, MD;  [facility];  Service: Endoscopy;;   HERNIA REPAIR     INTRAOPERATIVE TRANSESOPHAGEAL ECHOCARDIOGRAM N/A 03/31/2014   Procedure: INTRAOPERATIVE TRANSESOPHAGEAL ECHOCARDIOGRAM;  Surgeon: Melrose Nakayama, MD;  Location: [place];  Service: Open Heart Surgery;  Laterality: N/A;   IR ANGIOGRAM SELECTIVE EACH ADDITIONAL VESSEL  06/16/2020   IR ANGIOGRAM SELECTIVE EACH ADDITIONAL VESSEL  06/16/2020   IR ANGIOGRAM SELECTIVE EACH ADDITIONAL VESSEL  06/16/2020   IR ANGIOGRAM SELECTIVE EACH ADDITIONAL VESSEL  06/16/2020   IR ANGIOGRAM SELECTIVE EACH ADDITIONAL VESSEL  06/16/2020   IR ANGIOGRAM SELECTIVE EACH ADDITIONAL VESSEL  06/16/2020   IR ANGIOGRAM SELECTIVE EACH ADDITIONAL VESSEL  06/16/2020   IR ANGIOGRAM SELECTIVE EACH ADDITIONAL VESSEL   06/16/2020   IR ANGIOGRAM SELECTIVE EACH ADDITIONAL VESSEL  06/16/2020   IR ANGIOGRAM SELECTIVE EACH ADDITIONAL VESSEL  06/30/2020   IR ANGIOGRAM SELECTIVE EACH ADDITIONAL VESSEL  06/30/2020   IR ANGIOGRAM VISCERAL SELECTIVE  06/16/2020   IR ANGIOGRAM VISCERAL SELECTIVE  06/30/2020   IR EMBO ARTERIAL NOT HEMORR HEMANG INC GUIDE ROADMAPPING  06/16/2020   IR EMBO TUMOR ORGAN ISCHEMIA INFARCT INC GUIDE ROADMAPPING  06/30/2020   IR EMBO VENOUS NOT HEMORR HEMANG  INC GUIDE ROADMAPPING  01/13/2021   IR IVUS EACH ADDITIONAL NON CORONARY VESSEL  01/13/2021   IR RADIOLOGIST EVAL & MGMT  05/06/2020   IR RADIOLOGIST EVAL & MGMT  05/20/2020   IR TIPS  01/13/2021   IR US GUIDE VASC ACCESS RIGHT  06/16/2020   IR US GUIDE VASC ACCESS RIGHT  06/30/2020   IR US GUIDE VASC ACCESS RIGHT  01/13/2021   LAPAROSCOPIC UMBILICAL HERNIA REPAIR W/ MESH  06-06-2011   LEFT HEART CATHETERIZATION WITH CORONARY ANGIOGRAM N/A 03/28/2014   Procedure: LEFT HEART CATHETERIZATION WITH CORONARY ANGIOGRAM;  Surgeon: Sinclair Grooms, MD;  Location: [facility] CATH LAB;  Service: Cardiovascular;  Laterality: N/A;   LIPOMA EXCISION Left 08/25/2017   Procedure: EXCISION LIPOMA LEFT POSTERIOR THIGH;  Surgeon: Clovis Riley, MD;  Location: [place];  Service: General;  Laterality: Left;   NEPHROLITHOTOMY  1990'S   OPEN APPENDECTOMY W/ PARTIAL CECECTOMY  05-16-2004   PERCUTANEOUS CORONARY STENT INTERVENTION (PCI-S) N/A 06/28/2012   Procedure: PERCUTANEOUS CORONARY STENT INTERVENTION (PCI-S);  Surgeon: Sherren Mocha, MD;  Location: [facility] CATH LAB;  Service: Cardiovascular;  Laterality: N/A;   POLYPECTOMY  11/20/2020   Procedure: POLYPECTOMY;  Surgeon: Ronald Lobo, MD;  [facility];  Service: Endoscopy;;   POLYPECTOMY  05/02/2021   Procedure: POLYPECTOMY;  Surgeon: Arta Silence, MD;  Location: [facility];  Service: Endoscopy;;   RADIOLOGY WITH ANESTHESIA N/A 01/13/2021   Procedure: IR WITH ANESTHESIA - TIPS;  Surgeon: Suzette Battiest,  MD;  Location: [place];  Service: Radiology;  Laterality: N/A;    SOCIAL HISTORY: Social History   Socioeconomic History   Marital status: Married    Spouse name: Not on file   Number of children: Not on file   Years of education: Not on file   Highest education level: Not on file  Occupational History   Not on file  Tobacco Use   Smoking status: Former    Packs/day: 2.00    Years: 40.00    Pack years: 80.00    Types: Cigarettes    Quit date: 08/29/1996    Years since quitting: 24.7   Smokeless tobacco: Never  Vaping Use   Vaping Use: Never used  Substance and Sexual Activity   Alcohol use: Not Currently    Alcohol/week: 8.0 standard drinks    Types: 8 Cans of beer per week    Comment: 2 beers every other day   Drug use: No   Sexual activity: Yes  Other Topics Concern   Not on file  Social History Narrative   Lives in [place] with wife, son, and son's family.   Social Determinants of Health  Financial Resource Strain: Medium Risk   Difficulty of Paying Living Expenses: Somewhat hard  Food Insecurity: No Food Insecurity   Worried About Programme researcher, broadcasting/film/video in the Last Year: Never true   Ran Out of Food in the Last Year: Never true  Transportation Needs: No Transportation Needs   Lack of Transportation (Medical): No   Lack of Transportation (Non-Medical): No  Physical Activity: Not on file  Stress: Not on file  Social Connections: Not on file  Intimate Partner Violence: Not on file    FAMILY HISTORY: Family History  Problem Relation Age of Onset   Lung cancer Sister    Cancer Sister        lung   Cancer Mother    Cancer Father        died in his 38s.   Cancer Brother        lung   Coronary artery disease Other    Diabetes Other    Colon cancer Other    Cancer Sister        lung    ALLERGIES:  is allergic to fluzone quadrivalent [influenza vac split quad].  MEDICATIONS:  Current Outpatient Medications  Medication Sig Dispense Refill   Accu-Chek  Softclix Lancets lancets USE AS DIRECTED TO TEST BLOOD SUGAR FOUR TIMES DAILY 300 each 3   albuterol (VENTOLIN HFA) 108 (90 Base) MCG/ACT inhaler Inhale 2 puffs into the lungs every 6 (six) hours as needed for wheezing or shortness of breath. 8 g 0   blood glucose meter kit and supplies Dispense based on patient and insurance preference. Use up to four times daily as directed. (FOR ICD-10 E10.9, E11.9). 1 each 0   furosemide (LASIX) 40 MG tablet Take 1 tablet (40 mg total) by mouth daily. 90 tablet 0   lactulose (CHRONULAC) 10 GM/15ML solution Take 45 mLs (30 g total) by mouth 3 (three) times daily. 1892 mL 1   nitroGLYCERIN (NITROSTAT) 0.4 MG SL tablet Place 1 tablet (0.4 mg total) under the tongue every 5 (five) minutes as needed for chest pain. 25 tablet 6   pantoprazole (PROTONIX) 40 MG tablet Take 1 tablet (40 mg total) by mouth daily. 30 tablet 0   spironolactone (ALDACTONE) 50 MG tablet Take 1 tablet (50 mg total) by mouth daily. 30 tablet 1   ferrous sulfate 325 (65 FE) MG tablet Take 1 tablet (325 mg total) by mouth daily with breakfast. 90 tablet 0   No current facility-administered medications for this visit.     PHYSICAL EXAMINATION: ECOG PERFORMANCE STATUS: {CHL ONC ECOG QM:[id number]}  Vitals:   05/20/21 1357  BP: (!) 131/54  Pulse: 79  Resp: 19  Temp: 98.9 F (37.2 C)  SpO2: 99%   Filed Weights   05/20/21 1357  Weight: 228 lb 1.6 oz (103.5 kg)    GENERAL:alert, no distress and comfortable SKIN: skin color, texture, turgor are normal, no rashes or significant lesions EYES: normal, conjunctiva are pink and non-injected, sclera clear OROPHARYNX:no exudate, no erythema and lips, buccal mucosa, and tongue normal  NECK: supple, thyroid normal size, non-tender, without nodularity LYMPH:  no palpable lymphadenopathy in the cervical, axillary or inguinal LUNGS: clear to auscultation and percussion with normal breathing effort HEART: regular rate & rhythm and no murmurs and  no lower extremity edema ABDOMEN:abdomen soft, non-tender and normal bowel sounds Musculoskeletal:no cyanosis of digits and no clubbing  PSYCH: alert & oriented x 3 with fluent speech NEURO: no focal motor/sensory deficits  LABORATORY DATA:  I have reviewed the data as listed Lab Results  Component Value Date   WBC 2.4 (L) 05/05/2021   HGB 7.3 (L) 05/05/2021   HCT 24.6 (L) 05/05/2021   MCV 79.6 (L) 05/05/2021   PLT 66 (L) 05/05/2021     Chemistry      Component Value Date/Time   NA 134 (L) 05/05/2021 0352   NA 139 08/25/2020 1420   K 3.5 05/05/2021 0352   CL 102 05/05/2021 0352   CO2 23 05/05/2021 0352   BUN 16 05/05/2021 0352   BUN 26 08/25/2020 1420   CREATININE 1.00 05/05/2021 0352   CREATININE 0.75 06/09/2015 1609      Component Value Date/Time   CALCIUM 8.2 (L) 05/05/2021 0352   ALKPHOS 118 04/29/2021 0450   AST 26 04/29/2021 0450   ALT 21 04/29/2021 0450   BILITOT 1.3 (H) 04/29/2021 0450       RADIOGRAPHIC STUDIES: I have personally reviewed the radiological images as listed and agreed with the findings in the report. DG Chest 2 View  Result Date: 04/28/2021 CLINICAL DATA:  Dyspnea upon exertion. EXAM: CHEST - 2 VIEW COMPARISON:  March 28, 2021 FINDINGS: Multiple sternal wires and vascular clips are seen. Moderate severity diffusely increased interstitial lung markings are seen with associated prominence of the pulmonary vasculature. There is no evidence of a pleural effusion or pneumothorax. The cardiac silhouette is mildly enlarged and unchanged in size. There is marked severity calcification of the aortic arch. The visualized skeletal structures are unremarkable. IMPRESSION: Findings consistent with moderate severity congestive heart failure. Electronically Signed   By: Virgina Norfolk M.D.   On: 04/28/2021 19:07   US Abdomen Limited  Result Date: 05/05/2021 CLINICAL DATA:  Assess for ascites EXAM: LIMITED ABDOMEN ULTRASOUND FOR ASCITES TECHNIQUE: Limited  ultrasound survey for ascites was performed in all four abdominal quadrants. COMPARISON:  CT 01/29/2021 FINDINGS: Limited ultrasound of the 4 quadrants performed. Shadowing from bowel gas. No significant ascites is seen IMPRESSION: No significant ascites is visualized. Electronically Signed   By: Donavan Foil M.D.   On: 05/05/2021 15:43   DG Chest Port 1 View  Result Date: 04/29/2021 CLINICAL DATA:  Shortness of breath EXAM: PORTABLE CHEST 1 VIEW COMPARISON:  04/28/2021 FINDINGS: Post CABG changes. Stable cardiomegaly. Atherosclerotic calcification of the aortic knob. Pulmonary vascular congestion. Slightly improving aeration of the lung bases. No new focal consolidation. No pleural effusion or pneumothorax. IMPRESSION: 1. Cardiomegaly with pulmonary vascular congestion. 2. Slightly improving aeration of the lung bases. Electronically Signed   By: Davina Poke D.O.   On: 04/29/2021 14:12    All questions were answered. The patient knows to call the clinic with any problems, questions or concerns. I spent *** minutes in the care of this patient including H and P, review of records, counseling and coordination of care.     Benay Pike, MD 05/20/2021 2:26 PM

## 2021-05-20 NOTE — Progress Notes (Signed)
[facility] NOTE  Patient Care Team: [name], MD as PCP - General (Internal Medicine) Nahser, Wonda Cheng, MD as PCP - Cardiology (Cardiology) Clance, Armando Reichert, MD as Consulting Physician (Pulmonary Disease) Greer Pickerel, MD as Consulting Physician (General Surgery) Wonda Horner, MD (Gastroenterology) Rana Snare, MD (Inactive) (Urology) Hayden Pedro, MD (Ophthalmology)  CHIEF COMPLAINTS/PURPOSE OF CONSULTATION:  Pancytopenia  ASSESSMENT & PLAN:   Orders Placed This Encounter  Procedures   CBC with Differential/Platelet    Standing Status:   Standing    Number of Occurrences:   22    Standing Expiration Date:   05/20/2022   CMP ([place] only)    Standing Status:   Future    Number of Occurrences:   1    Standing Expiration Date:   05/20/2022   Iron and TIBC    Standing Status:   Future    Number of Occurrences:   1    Standing Expiration Date:   05/20/2022   Ferritin    Standing Status:   Future    Number of Occurrences:   1    Standing Expiration Date:   05/20/2022   Vitamin B12    Standing Status:   Future    Number of Occurrences:   1    Standing Expiration Date:   05/20/2022   Folate RBC    Standing Status:   Future    Number of Occurrences:   1    Standing Expiration Date:   05/20/2022   Lactate dehydrogenase    Standing Status:   Future    Number of Occurrences:   1    Standing Expiration Date:   05/20/2022   Reticulocytes    Standing Status:   Future    Number of Occurrences:   1    Standing Expiration Date:   05/20/2022   Protime-INR    Standing Status:   Future    Number of Occurrences:   1    Standing Expiration Date:   05/20/2022   This is a 75 year old male patient with past medical history of cirrhosis status post TIPS procedure in May 2022, solitary hepatocellular carcinoma which was treated with Y 90 back in November 2021 who was referred for progressive pancytopenia.  Patient arrived to the appointment today  by himself.  He denies any health complaints today, he does not really quite answer all the questions, I believe most of it is his hearing deficit.  His son was present on the phone.  According to the son, his dad has been needing blood transfusion every 2 to 3 weeks.  Every time his dad feels weak, they take him to the ER, he was found to have very low blood counts and then he gets a blood transfusion.  He denies any current bleeding. Physical examination, pitting pedal edema, severe extending throughout the legs, some ascites fluid, gynecomastia present.  No icterus or tremors noted today. Have reviewed his labs.  He has had chronic pancytopenia which is likely all related to the cirrhosis. His hemoglobin today 6.8 g/dL, will arrange for a unit of packed blood cells.  He will return to clinic for follow-up in 4 weeks.  No indication for platelet transfusion.  I have clearly explained to him that his cytopenias are likely related to the underlying liver disease.  As long as he requires intermittent blood transfusions, this is certainly reasonable but if he starts requiring multiple times a week blood transfusion, he  is best served with comfort care.  Son expressed understanding of these recommendations, tells me that dad is aware of his health situation in general.  I have however encouraged him to talk to his dad about long-term measures. We will arrange for a unit of packed red blood cell transfusion, repeat CBC in 4 weeks. I did send a message to Dr Earleen Newport about his missed MRI imaging.  HISTORY OF PRESENTING ILLNESS:   [name] 75 y.o. male is here because of pancytopenia from liver cirrhosis  History of solitary Shirley within segment 8 of the liver back in Nov 2021. Last imaging from January 29 2021 with cirrhotic morphology of the liver, with interval placement of right hepatic vein TIPS and coiling of gastric varices. Subcapsular hypodense lesion of the peripheral liver dome, hepatic segment VII,  presumably a treated hepatocellular carcinoma,incompletely characterized and assessed by noncontrast CT. Patient is not a good historian and is very hard of hearing.  His son was present on the phone.  According to son, patient missed getting the repeat MRI since he was in the hospital.  According to the son patient has been requiring blood transfusion every 2 to 3 weeks and this was thought to be secondary to bleeding from gastric varices.  However given his pancytopenia, he was referred to hematology outpatient. Patient denies any blood in his stool or black stool for me today.  He feels well except for severe bilateral lower extremity swelling.  He does not really answer all the questions, I wonder if some of it is his hearing that he does not understand. He did mention that he had an ultrasound a few weeks ago and this did not show any requirement for paracentesis. Iron is listed in his medication but he is not quite sure about all the medications. His son was not with him, we called him.  MEDICAL HISTORY:  Past Medical History:  Diagnosis Date   Arthritis    "joints tighten up sometimes" (03/27/2104)   Carcinoma of liver, hepatocellular ([place]) 06/30/2020   Chronic lower back pain    Coronary artery disease involving native coronary artery of native heart without angina pectoris    Severe left main disease at catheterization July 2015  CABG x3 with a LIMA to the LAD, SVG to the OM, SVG to the PDA on 03/31/14. EF 60% by cath.    Diastolic heart failure (HCC)    GERD without esophagitis 08/04/2010   Hepatic cirrhosis ([place])    a. Dx 01/2014 - CT a/p    History of blood transfusion    "related to bleeding ulcers"   History of concussion    1976--  NO RESIDUAL   History of GI bleed    a. UGIB 07/2012;  b. 01/2014 admission with GIB/FOB stool req 1U prbc's->EGD showed portal gastropathy, barrett's esoph, and chronic active h. pylori gastritis.   History of gout    2007 &  2008  LEFT LEG-- NO ISSUE  SINCE   Hyperlipidemia    Iron deficiency anemia    Kidney stones    OA (osteoarthritis of spine)    LOWER BACK--  INTERMITTANT LEFT LEG NUMBNESS   OSA (obstructive sleep apnea)    PULMOLOGIST-  DR CLANCE--  MODERATE OSA  STARTED CPAP 2012--  BUT CURRENTLY HAS NOT USED PAST 6 MONTHS   Phimosis    a. s/p circumcision 2015.   Type 2 diabetes mellitus ([place])    Unspecified essential hypertension     SURGICAL  HISTORY: Past Surgical History:  Procedure Laterality Date   ANKLE FRACTURE SURGERY Right 1989   "plate put in"   APPENDECTOMY  05-16-2004   open   BIOPSY  01/08/2021   Procedure: BIOPSY;  Surgeon: Wilford Corner, MD;  Location: [facility];  Service: Endoscopy;;   BIOPSY  05/02/2021   Procedure: BIOPSY;  Surgeon: Arta Silence, MD;  Location: [facility];  Service: Endoscopy;;   CIRCUMCISION N/A 09/09/2013   Procedure: CIRCUMCISION ADULT;  Surgeon: Bernestine Amass, MD;  Location[facility];  Service: Urology;  Laterality: N/A;   COLECTOMY  05-16-2004   COLONOSCOPY N/A 05/02/2021   Procedure: COLONOSCOPY;  Surgeon: Arta Silence, MD;  [facility]: [facility];  Service: Endoscopy;  Laterality: N/A;   COLONOSCOPY WITH PROPOFOL N/A 11/20/2020   Procedure: COLONOSCOPY WITH PROPOFOL;  Surgeon: Ronald Lobo, MD;  Location: [facility];  Service: Endoscopy;  Laterality: N/A;   CORONARY ANGIOPLASTY WITH STENT PLACEMENT  06/28/2012  DR COOPER   PCI W/  X1 DES to [facility]. LAD/  LM  40% OSTIAL & 50-60% DISTAL /  50% PROX LCX/  30-40% PROX RCA & 50% MID RCA/   LVEF 65-70%   CORONARY ARTERY BYPASS GRAFT N/A 03/31/2014   Procedure: CORONARY ARTERY BYPASS GRAFTING (CABG) times 3 using left internal mammary artery and right saphenous vein.;  Surgeon: Melrose Nakayama, MD;  Location: [facility];  Service: Open Heart Surgery;  Laterality: N/A;   DOBUTAMINE STRESS ECHO  06-08-2012   MODERATE HYPOKINESIS/ ISCHEMIA MID INFERIOR WALL   ESOPHAGOGASTRODUODENOSCOPY  08/15/2012    Procedure: ESOPHAGOGASTRODUODENOSCOPY (EGD);  Surgeon: Wonda Horner, MD;  Location: [facility] ENDOSCOPY;  Service: Endoscopy;  Laterality: N/A;   ESOPHAGOGASTRODUODENOSCOPY N/A 02/17/2014   Procedure: ESOPHAGOGASTRODUODENOSCOPY (EGD);  Surgeon: Jeryl Columbia, MD;  Location: [facility] ENDOSCOPY;  Service: Endoscopy;  Laterality: N/A;   ESOPHAGOGASTRODUODENOSCOPY N/A 04/17/2020   Procedure: ESOPHAGOGASTRODUODENOSCOPY (EGD);  Surgeon: Ronnette Juniper, MD;  Location: [facility] [place];  Service: Gastroenterology;  Laterality: N/A;   ESOPHAGOGASTRODUODENOSCOPY N/A 09/11/2020   Procedure: ESOPHAGOGASTRODUODENOSCOPY (EGD);  Surgeon: Clarene Essex, MD;  Location: [facility];  Service: Endoscopy;  Laterality: N/A;   ESOPHAGOGASTRODUODENOSCOPY  10/09/2020   ESOPHAGOGASTRODUODENOSCOPY N/A 10/09/2020   Procedure: ESOPHAGOGASTRODUODENOSCOPY (EGD);  Surgeon: Otis Brace, MD;  Location[facility] ENDOSCOPY;  Service: Gastroenterology;  Laterality: N/A;   ESOPHAGOGASTRODUODENOSCOPY N/A 01/08/2021   Procedure: ESOPHAGOGASTRODUODENOSCOPY (EGD);  Surgeon: Wilford Corner, MD;  Location: [facility];  Service: Endoscopy;  Laterality: N/A;   ESOPHAGOGASTRODUODENOSCOPY N/A 05/02/2021   Procedure: ESOPHAGOGASTRODUODENOSCOPY (EGD);  Surgeon: Arta Silence, MD;  Location: [facility];  Service: Endoscopy;  Laterality: N/A;   ESOPHAGOGASTRODUODENOSCOPY (EGD) WITH PROPOFOL N/A 09/30/2017   Procedure: ESOPHAGOGASTRODUODENOSCOPY (EGD) WITH PROPOFOL;  Surgeon: Wonda Horner, MD;  Location[facility]COPY;  Service: Endoscopy;  Laterality: N/A;   ESOPHAGOGASTRODUODENOSCOPY (EGD) WITH PROPOFOL N/A 12/20/2017   Procedure: ESOPHAGOGASTRODUODENOSCOPY (EGD) WITH PROPOFOL;  Surgeon: Clarene Essex, MD;  Location: [place];  Service: Endoscopy;  Laterality: N/A;   ESOPHAGOGASTRODUODENOSCOPY (EGD) WITH PROPOFOL N/A 08/10/2020   Procedure: ESOPHAGOGASTRODUODENOSCOPY (EGD) WITH PROPOFOL;  Surgeon: Wilford Corner, MD;  Location: [place];  Service: Endoscopy;   Laterality: N/A;   ESOPHAGOGASTRODUODENOSCOPY (EGD) WITH PROPOFOL N/A 11/20/2020   Procedure: ESOPHAGOGASTRODUODENOSCOPY (EGD) WITH PROPOFOL;  Surgeon: Ronald Lobo, MD;  [facility];  Service: Endoscopy;  Laterality: N/A;   ESOPHAGOGASTRODUODENOSCOPY (EGD) WITH PROPOFOL N/A 02/28/2021   Procedure: ESOPHAGOGASTRODUODENOSCOPY (EGD) WITH PROPOFOL;  Surgeon: Clarene Essex, MD;  [facility];  Service: Endoscopy;  Laterality: N/A;   GIVENS  CAPSULE STUDY N/A 11/20/2020   Procedure: GIVENS CAPSULE STUDY;  Surgeon: Ronald Lobo, MD;  [facility];  Service: Endoscopy;  Laterality: N/A;   HEMOSTASIS CLIP PLACEMENT  05/02/2021   Procedure: HEMOSTASIS CLIP PLACEMENT;  Surgeon: Arta Silence, MD;  [facility];  Service: Endoscopy;;   HERNIA REPAIR     INTRAOPERATIVE TRANSESOPHAGEAL ECHOCARDIOGRAM N/A 03/31/2014   Procedure: INTRAOPERATIVE TRANSESOPHAGEAL ECHOCARDIOGRAM;  Surgeon: Melrose Nakayama, MD;  Location: [place];  Service: Open Heart Surgery;  Laterality: N/A;   IR ANGIOGRAM SELECTIVE EACH ADDITIONAL VESSEL  06/16/2020   IR ANGIOGRAM SELECTIVE EACH ADDITIONAL VESSEL  06/16/2020   IR ANGIOGRAM SELECTIVE EACH ADDITIONAL VESSEL  06/16/2020   IR ANGIOGRAM SELECTIVE EACH ADDITIONAL VESSEL  06/16/2020   IR ANGIOGRAM SELECTIVE EACH ADDITIONAL VESSEL  06/16/2020   IR ANGIOGRAM SELECTIVE EACH ADDITIONAL VESSEL  06/16/2020   IR ANGIOGRAM SELECTIVE EACH ADDITIONAL VESSEL  06/16/2020   IR ANGIOGRAM SELECTIVE EACH ADDITIONAL VESSEL  06/16/2020   IR ANGIOGRAM SELECTIVE EACH ADDITIONAL VESSEL  06/16/2020   IR ANGIOGRAM SELECTIVE EACH ADDITIONAL VESSEL  06/30/2020   IR ANGIOGRAM SELECTIVE EACH ADDITIONAL VESSEL  06/30/2020   IR ANGIOGRAM VISCERAL SELECTIVE  06/16/2020   IR ANGIOGRAM VISCERAL SELECTIVE  06/30/2020   IR EMBO ARTERIAL NOT HEMORR HEMANG INC GUIDE ROADMAPPING  06/16/2020   IR EMBO TUMOR ORGAN ISCHEMIA INFARCT INC GUIDE ROADMAPPING  06/30/2020   IR EMBO  VENOUS NOT HEMORR HEMANG  INC GUIDE ROADMAPPING  01/13/2021   IR IVUS EACH ADDITIONAL NON CORONARY VESSEL  01/13/2021   IR RADIOLOGIST EVAL & MGMT  05/06/2020   IR RADIOLOGIST EVAL & MGMT  05/20/2020   IR TIPS  01/13/2021   IR US GUIDE VASC ACCESS RIGHT  06/16/2020   IR US GUIDE VASC ACCESS RIGHT  06/30/2020   IR US GUIDE VASC ACCESS RIGHT  01/13/2021   LAPAROSCOPIC UMBILICAL HERNIA REPAIR W/ MESH  06-06-2011   LEFT HEART CATHETERIZATION WITH CORONARY ANGIOGRAM N/A 03/28/2014   Procedure: LEFT HEART CATHETERIZATION WITH CORONARY ANGIOGRAM;  Surgeon: Sinclair Grooms, MD;  Location: [facility] CATH LAB;  Service: Cardiovascular;  Laterality: N/A;   LIPOMA EXCISION Left 08/25/2017   Procedure: EXCISION LIPOMA LEFT POSTERIOR THIGH;  Surgeon: Clovis Riley, MD;  Location: [place];  Service: General;  Laterality: Left;   NEPHROLITHOTOMY  1990'S   OPEN APPENDECTOMY W/ PARTIAL CECECTOMY  05-16-2004   PERCUTANEOUS CORONARY STENT INTERVENTION (PCI-S) N/A 06/28/2012   Procedure: PERCUTANEOUS CORONARY STENT INTERVENTION (PCI-S);  Surgeon: Sherren Mocha, MD;  Location: [facility] CATH LAB;  Service: Cardiovascular;  Laterality: N/A;   POLYPECTOMY  11/20/2020   Procedure: POLYPECTOMY;  Surgeon: Ronald Lobo, MD;  [facility];  Service: Endoscopy;;   POLYPECTOMY  05/02/2021   Procedure: POLYPECTOMY;  Surgeon: Arta Silence, MD;  Location: [facility];  Service: Endoscopy;;   RADIOLOGY WITH ANESTHESIA N/A 01/13/2021   Procedure: IR WITH ANESTHESIA - TIPS;  Surgeon: Suzette Battiest, MD;  Location: [place];  Service: Radiology;  Laterality: N/A;    SOCIAL HISTORY: Social History   Socioeconomic History   Marital status: Married    Spouse name: Not on file   Number of children: Not on file   Years of education: Not on file   Highest education level: Not on file  Occupational History   Not on file  Tobacco Use   Smoking status: Former    Packs/day: 2.00    Years: 40.00    Pack years: 80.00    Types:  Cigarettes    Quit date: 08/29/1996    Years since quitting: 24.7   Smokeless tobacco: Never  Vaping Use   Vaping Use: Never used  Substance and Sexual Activity   Alcohol use: Not Currently    Alcohol/week: 8.0 standard drinks    Types: 8 Cans of beer per week    Comment: 2 beers every other day   Drug use: No   Sexual activity: Yes  Other Topics Concern   Not on file  Social History Narrative   Lives in [place] with wife, son, and son's family.   Social Determinants of Health   Financial Resource Strain: Medium Risk   Difficulty of Paying Living Expenses: Somewhat hard  Food Insecurity: No Food Insecurity   Worried About Charity fundraiser in the Last Year: Never true   Ran Out of Food in the Last Year: Never true  Transportation Needs: No Transportation Needs   Lack of Transportation (Medical): No   Lack of Transportation (Non-Medical): No  Physical Activity: Not on file  Stress: Not on file  Social Connections: Not on file  Intimate Partner Violence: Not on file    FAMILY HISTORY: Family History  Problem Relation Age of Onset   Lung cancer Sister    Cancer Sister        lung   Cancer Mother    Cancer Father        died in his 14s.   Cancer Brother        lung   Coronary artery disease Other    Diabetes Other    Colon cancer Other    Cancer Sister        lung    ALLERGIES:  is allergic to fluzone quadrivalent [influenza vac split quad].  MEDICATIONS:  Current Outpatient Medications  Medication Sig Dispense Refill   Accu-Chek Softclix Lancets lancets USE AS DIRECTED TO TEST BLOOD SUGAR FOUR TIMES DAILY 300 each 3   albuterol (VENTOLIN HFA) 108 (90 Base) MCG/ACT inhaler Inhale 2 puffs into the lungs every 6 (six) hours as needed for wheezing or shortness of breath. 8 g 0   blood glucose meter kit and supplies Dispense based on patient and insurance preference. Use up to four times daily as directed. (FOR ICD-10 E10.9, E11.9). 1 each 0   furosemide (LASIX) 40 MG  tablet Take 1 tablet (40 mg total) by mouth daily. 90 tablet 0   lactulose (CHRONULAC) 10 GM/15ML solution Take 45 mLs (30 g total) by mouth 3 (three) times daily. 1892 mL 1   nitroGLYCERIN (NITROSTAT) 0.4 MG SL tablet Place 1 tablet (0.4 mg total) under the tongue every 5 (five) minutes as needed for chest pain. 25 tablet 6   pantoprazole (PROTONIX) 40 MG tablet Take 1 tablet (40 mg total) by mouth daily. 30 tablet 0   spironolactone (ALDACTONE) 50 MG tablet Take 1 tablet (50 mg total) by mouth daily. 30 tablet 1   ferrous sulfate 325 (65 FE) MG tablet Take 1 tablet (325 mg total) by mouth daily with breakfast. 90 tablet 0   No current facility-administered medications for this visit.    PHYSICAL EXAMINATION: ECOG PERFORMANCE STATUS: 1 - Symptomatic but completely ambulatory  Vitals:   05/20/21 1357  BP: (!) 131/54  Pulse: 79  Resp: 19  Temp: 98.9 F (37.2 C)  SpO2: 99%   Filed Weights   05/20/21 1357  Weight: 228 lb 1.6 oz (103.5 kg)    GENERAL:alert, no distress and comfortable SKIN:  skin color, texture, turgor are normal, no rashes or significant lesions EYES: normal, conjunctiva are pink and non-injected, sclera clear OROPHARYNX:no exudate, no erythema and lips, buccal mucosa, and tongue normal  NECK: supple, thyroid normal size, non-tender, without nodularity LYMPH:  no palpable lymphadenopathy in the cervical, axillary  LUNGS: clear to auscultation and percussion with normal breathing effort HEART: regular rate & rhythm and no murmurs , severe pitting BLE edema. ABDOMEN:abdomen soft, ascitic fluid present Musculoskeletal:no cyanosis of digits and no clubbing  PSYCH: speech is not fluent. NEURO: no focal motor/sensory deficits  LABORATORY DATA:  I have reviewed the data as listed Lab Results  Component Value Date   WBC 2.4 (L) 05/20/2021   HGB 6.8 (LL) 05/20/2021   HCT 23.0 (L) 05/20/2021   MCV 86.1 05/20/2021   PLT 167 05/20/2021     Chemistry      Component  Value Date/Time   NA 138 05/20/2021 1444   NA 139 08/25/2020 1420   K 3.8 05/20/2021 1444   CL 107 05/20/2021 1444   CO2 23 05/20/2021 1444   BUN 18 05/20/2021 1444   BUN 26 08/25/2020 1420   CREATININE 1.13 05/20/2021 1444   CREATININE 0.75 06/09/2015 1609      Component Value Date/Time   CALCIUM 8.7 (L) 05/20/2021 1444   ALKPHOS 222 (H) 05/20/2021 1444   AST 43 (H) 05/20/2021 1444   ALT 29 05/20/2021 1444   BILITOT 0.9 05/20/2021 1444     CBC reviewed for the past several months. He has had leukopenia, anemia and thrombocytopenia which is long-term.  Overall his labs have not changed significantly.  No evidence of hemolysis.  Reticulocyte count is normal.  LDH very mildly elevated at 215. AST of 43, ALT of 29, alk phos of 222 Iron panel, ferritin, R41 and folic acid from today are pending.  RADIOGRAPHIC STUDIES: I have personally reviewed the radiological images as listed and agreed with the findings in the report. DG Chest 2 View  Result Date: 04/28/2021 CLINICAL DATA:  Dyspnea upon exertion. EXAM: CHEST - 2 VIEW COMPARISON:  March 28, 2021 FINDINGS: Multiple sternal wires and vascular clips are seen. Moderate severity diffusely increased interstitial lung markings are seen with associated prominence of the pulmonary vasculature. There is no evidence of a pleural effusion or pneumothorax. The cardiac silhouette is mildly enlarged and unchanged in size. There is marked severity calcification of the aortic arch. The visualized skeletal structures are unremarkable. IMPRESSION: Findings consistent with moderate severity congestive heart failure. Electronically Signed   By: Virgina Norfolk M.D.   On: 04/28/2021 19:07   US Abdomen Limited  Result Date: 05/05/2021 CLINICAL DATA:  Assess for ascites EXAM: LIMITED ABDOMEN ULTRASOUND FOR ASCITES TECHNIQUE: Limited ultrasound survey for ascites was performed in all four abdominal quadrants. COMPARISON:  CT 01/29/2021 FINDINGS: Limited  ultrasound of the 4 quadrants performed. Shadowing from bowel gas. No significant ascites is seen IMPRESSION: No significant ascites is visualized. Electronically Signed   By: Donavan Foil M.D.   On: 05/05/2021 15:43   DG Chest Port 1 View  Result Date: 04/29/2021 CLINICAL DATA:  Shortness of breath EXAM: PORTABLE CHEST 1 VIEW COMPARISON:  04/28/2021 FINDINGS: Post CABG changes. Stable cardiomegaly. Atherosclerotic calcification of the aortic knob. Pulmonary vascular congestion. Slightly improving aeration of the lung bases. No new focal consolidation. No pleural effusion or pneumothorax. IMPRESSION: 1. Cardiomegaly with pulmonary vascular congestion. 2. Slightly improving aeration of the lung bases. Electronically Signed   By: Davina Poke D.O.  On: 04/29/2021 14:12    All questions were answered. The patient knows to call the clinic with any problems, questions or concerns. I spent 45 minutes in the care of this patient including H and P, review of records, counseling and coordination of care.     Benay Pike, MD 05/20/2021 3:45 PM

## 2021-05-20 NOTE — Telephone Encounter (Signed)
CRITICAL VALUE STICKER  CRITICAL VALUE: hgb 6.8  RECEIVER (on-site recipient of call): L Mussa Groesbeck RN  DATE & TIME NOTIFIED: 05/20/21 @ 1510  MESSENGER (representative from lab):  MD NOTIFIED: [place]  TIME OF NOTIFICATION: [id number]  RESPONSE: will arrange for blood transfusion

## 2021-05-21 ENCOUNTER — Telehealth: Payer: Self-pay | Admitting: Physician Assistant

## 2021-05-21 LAB — IRON AND TIBC
Iron: 141 ug/dL (ref 42–163)
Saturation Ratios: 40 % (ref 20–55)
TIBC: 349 ug/dL (ref 202–409)
UIBC: 208 ug/dL (ref 117–376)

## 2021-05-21 LAB — FOLATE RBC
Folate, Hemolysate: 350 ng/mL
Folate, RBC: 1522 ng/mL (ref >498)
Hematocrit: 23 % — ABNORMAL LOW (ref 37.5–51.0)

## 2021-05-21 LAB — FERRITIN: Ferritin: 62 ng/mL (ref 24–336)

## 2021-05-21 NOTE — Telephone Encounter (Signed)
Sch per 9/22inbasket,left msg

## 2021-05-24 ENCOUNTER — Inpatient Hospital Stay: Payer: Medicare HMO

## 2021-05-25 ENCOUNTER — Encounter: Payer: Self-pay | Admitting: *Deleted

## 2021-05-25 ENCOUNTER — Ambulatory Visit
Admission: RE | Admit: 2021-05-25 | Discharge: 2021-05-25 | Disposition: A | Discharge status: Home / Self Care | Payer: Medicare HMO | Source: Ambulatory Visit | Attending: Student | Admitting: Student

## 2021-05-25 ENCOUNTER — Other Ambulatory Visit: Payer: Self-pay | Admitting: Interventional Radiology

## 2021-05-25 ENCOUNTER — Other Ambulatory Visit: Payer: Self-pay

## 2021-05-25 DIAGNOSIS — K766 Portal hypertension: Secondary | ICD-10-CM | POA: Diagnosis not present

## 2021-05-25 DIAGNOSIS — Z95828 Presence of other vascular implants and grafts: Secondary | ICD-10-CM | POA: Diagnosis not present

## 2021-05-25 DIAGNOSIS — K7460 Unspecified cirrhosis of liver: Secondary | ICD-10-CM | POA: Diagnosis not present

## 2021-05-25 DIAGNOSIS — I864 Gastric varices: Secondary | ICD-10-CM

## 2021-05-25 HISTORY — PX: IR RADIOLOGIST EVAL & MGMT: IMG5224

## 2021-05-25 NOTE — Progress Notes (Signed)
Reason for follow up:  Status post TIPS creation  History of Present Illness: [name] is a 75 y.o. male with history of cirrhosis (Child Pugh A, MELD-Na 11) with associated hemorrhagic gastroesophageal varices in addition to history of hepatocellular carcinoma s/p TARE segmentectomy in November 2021.  He is now status post TIPS creation on 01/13/21.  Since TIPS, he has been in and out of the hospital multiple times thus today is our first follow up.    He has been followed by Heme/Onc who describes blood transfusions every 2-3 weeks, most recently on 9/22.  He underwent EGD and colonoscopy on 05/02/21:  Colonoscopy: - Hemorrhoids found on perianal exam. - Ulcerated mucosa in the rectum and in the recto-sigmoid colon. Biopsied. - Diverticulosis in the sigmoid colon and in the descending colon. - A single non-bleeding colonic angioectasia. Clip was placed. - A few non-bleeding colonic angioectasias. - The distal rectum and anal verge are normal on retroflexion view. - The examination was otherwise normal. - Overall, I think there are two issues. First, the acute hematochezia, this is most likely from diverticulosis or the rectosigmoid colitis or hemorrhoids; fortunately, at this point the bleeding has resolved; there was no blood seen anywhere to the extent of our examinatoin. I don't think any of these entities are cause of patient's acute on chronic anemia. Patient's acute on chronic anemia is most likely from combination of portal gastropathy and diffuse GI tract AVMs. Would ideally treat the anemia with oral and parenteral iron, but if this does not suffice, as outpatient could consider capsule endoscopy; if AVMs seem most limited to the stomach (which I think is unlikely), then could consider EGD with diffuse APC (which is only done by Dr. Watt Climes in our group); if diffuse AVMs seen throughout the small bowel, which is what I would suspect, then doubt much utility in targeted  APC/ablation of these avms, and would most likely just treat anemia supportively (periodic transfusions) without repeat ongoing multiple procedures.  EGD: - Normal esophagus. - Portal hypertensive gastropathy. - Type 1 isolated gastric varices (IGV1, varices located in the fundus), without bleeding. - Multiple non-bleeding angioectasias in the stomach. - Few small proximal duodenal amvs, otherwise normal duodenal bulb, first portion of the duodenum and second portion of the duodenum. - In total, estimate over 15 gastric avms as well as mild-moderate portal gastropathy. These could all lead to chronic anemia, but would be very unlikely to cause frank hematochezia (which is what patient was experiencing).   He presents today via telephone visit accompanied by his wife.  History and review of systems is very limited.  He states that overall he's feeling pretty good.  He and his wife deny any significant encephalopathy.  He reports being compliant with lactulose, lasix, and spironolactone.      Past Medical History:  Diagnosis Date   Arthritis    "joints tighten up sometimes" (03/27/2104)   Carcinoma of liver, hepatocellular ([place]) 06/30/2020   Chronic lower back pain    Coronary artery disease involving native coronary artery of native heart without angina pectoris    Severe left main disease at catheterization July 2015  CABG x3 with a LIMA to the LAD, SVG to the OM, SVG to the PDA on 03/31/14. EF 60% by cath.    Diastolic heart failure (HCC)    GERD without esophagitis 08/04/2010   Hepatic cirrhosis ([place])    a. Dx 01/2014 - CT a/p    History of blood transfusion    "  related to bleeding ulcers"   History of concussion    1976--  NO RESIDUAL   History of GI bleed    a. UGIB 07/2012;  b. 01/2014 admission with GIB/FOB stool req 1U prbc's->EGD showed portal gastropathy, barrett's esoph, and chronic active h. pylori gastritis.   History of gout    2007 &  2008  LEFT LEG-- NO ISSUE SINCE    Hyperlipidemia    Iron deficiency anemia    Kidney stones    OA (osteoarthritis of spine)    LOWER BACK--  INTERMITTANT LEFT LEG NUMBNESS   OSA (obstructive sleep apnea)    PULMOLOGIST-  DR CLANCE--  MODERATE OSA  STARTED CPAP 2012--  BUT CURRENTLY HAS NOT USED PAST 6 MONTHS   Phimosis    a. s/p circumcision 2015.   Type 2 diabetes mellitus ([place])    Unspecified essential hypertension     Past Surgical History:  Procedure Laterality Date   ANKLE FRACTURE SURGERY Right 1989   "plate put in"   APPENDECTOMY  05-16-2004   open   BIOPSY  01/08/2021   Procedure: BIOPSY;  Surgeon: Wilford Corner, MD;  Location: [facility];  Service: Endoscopy;;   BIOPSY  05/02/2021   Procedure: BIOPSY;  Surgeon: Arta Silence, MD;  Location: [facility];  Service: Endoscopy;;   CIRCUMCISION N/A 09/09/2013   Procedure: CIRCUMCISION ADULT;  Surgeon: Bernestine Amass, MD;  Location: [facility];  Service: Urology;  Laterality: N/A;   COLECTOMY  05-16-2004   COLONOSCOPY N/A 05/02/2021   Procedure: COLONOSCOPY;  Surgeon: Arta Silence, MD;  Location: [facility];  Service: Endoscopy;  Laterality: N/A;   COLONOSCOPY WITH PROPOFOL N/A 11/20/2020   Procedure: COLONOSCOPY WITH PROPOFOL;  Surgeon: Ronald Lobo, MD;  Location: [facility];  Service: Endoscopy;  Laterality: N/A;   CORONARY ANGIOPLASTY WITH STENT PLACEMENT  06/28/2012  DR COOPER   PCI W/  X1 DES to [place]. LAD/  LM  40% OSTIAL & 50-60% DISTAL /  50% PROX LCX/  30-40% PROX RCA & 50% MID RCA/   LVEF 65-70%   CORONARY ARTERY BYPASS GRAFT N/A 03/31/2014   Procedure: CORONARY ARTERY BYPASS GRAFTING (CABG) times 3 using left internal mammary artery and right saphenous vein.;  Surgeon: Melrose Nakayama, MD;  Location: [place];  Service: Open Heart Surgery;  Laterality: N/A;   DOBUTAMINE STRESS ECHO  06-08-2012   MODERATE HYPOKINESIS/ ISCHEMIA MID INFERIOR WALL   ESOPHAGOGASTRODUODENOSCOPY  08/15/2012   Procedure:  ESOPHAGOGASTRODUODENOSCOPY (EGD);  Surgeon: Wonda Horner, MD;  Location[facility] ENDOSCOPY;  Service: Endoscopy;  Laterality: N/A;   ESOPHAGOGASTRODUODENOSCOPY N/A 02/17/2014   Procedure: ESOPHAGOGASTRODUODENOSCOPY (EGD);  Surgeon: Jeryl Columbia, MD;  Location: [facility] ENDOSCOPY;  Service: Endoscopy;  Laterality: N/A;   ESOPHAGOGASTRODUODENOSCOPY N/A 04/17/2020   Procedure: ESOPHAGOGASTRODUODENOSCOPY (EGD);  Surgeon: Ronnette Juniper, MD;  Location: [place];  Service: Gastroenterology;  Laterality: N/A;   ESOPHAGOGASTRODUODENOSCOPY N/A 09/11/2020   Procedure: ESOPHAGOGASTRODUODENOSCOPY (EGD);  Surgeon: Clarene Essex, MD;  Location: [facility];  Service: Endoscopy;  Laterality: N/A;   ESOPHAGOGASTRODUODENOSCOPY  10/09/2020   ESOPHAGOGASTRODUODENOSCOPY N/A 10/09/2020   Procedure: ESOPHAGOGASTRODUODENOSCOPY (EGD);  Surgeon: Otis Brace, MD;  Location[facility] ENDOSCOPY;  Service: Gastroenterology;  Laterality: N/A;   ESOPHAGOGASTRODUODENOSCOPY N/A 01/08/2021   Procedure: ESOPHAGOGASTRODUODENOSCOPY (EGD);  Surgeon: Wilford Corner, MD;  Location: [facility];  Service: Endoscopy;  Laterality: N/A;   ESOPHAGOGASTRODUODENOSCOPY N/A 05/02/2021   Procedure: ESOPHAGOGASTRODUODENOSCOPY (EGD);  Surgeon: Arta Silence, MD;  Location: [facility];  Service: Endoscopy;  Laterality: N/A;  ESOPHAGOGASTRODUODENOSCOPY (EGD) WITH PROPOFOL N/A 09/30/2017   Procedure: ESOPHAGOGASTRODUODENOSCOPY (EGD) WITH PROPOFOL;  Surgeon: Wonda Horner, MD;  Location[facility]COPY;  Service: Endoscopy;  Laterality: N/A;   ESOPHAGOGASTRODUODENOSCOPY (EGD) WITH PROPOFOL N/A 12/20/2017   Procedure: ESOPHAGOGASTRODUODENOSCOPY (EGD) WITH PROPOFOL;  Surgeon: Clarene Essex, MD;  Location: [place];  Service: Endoscopy;  Laterality: N/A;   ESOPHAGOGASTRODUODENOSCOPY (EGD) WITH PROPOFOL N/A 08/10/2020   Procedure: ESOPHAGOGASTRODUODENOSCOPY (EGD) WITH PROPOFOL;  Surgeon: Wilford Corner, MD;  Location: [place];  Service: Endoscopy;   Laterality: N/A;   ESOPHAGOGASTRODUODENOSCOPY (EGD) WITH PROPOFOL N/A 11/20/2020   Procedure: ESOPHAGOGASTRODUODENOSCOPY (EGD) WITH PROPOFOL;  Surgeon: Ronald Lobo, MD;  [facility];  Service: Endoscopy;  Laterality: N/A;   ESOPHAGOGASTRODUODENOSCOPY (EGD) WITH PROPOFOL N/A 02/28/2021   Procedure: ESOPHAGOGASTRODUODENOSCOPY (EGD) WITH PROPOFOL;  Surgeon: Clarene Essex, MD;  [facility]: [facility];  Service: Endoscopy;  Laterality: N/A;   GIVENS CAPSULE STUDY N/A 11/20/2020   Procedure: GIVENS CAPSULE STUDY;  Surgeon: Ronald Lobo, MD;  Location: [facility];  Service: Endoscopy;  Laterality: N/A;   HEMOSTASIS CLIP PLACEMENT  05/02/2021   Procedure: HEMOSTASIS CLIP PLACEMENT;  Surgeon: Arta Silence, MD;  [facility];  Service: Endoscopy;;   HERNIA REPAIR     INTRAOPERATIVE TRANSESOPHAGEAL ECHOCARDIOGRAM N/A 03/31/2014   Procedure: INTRAOPERATIVE TRANSESOPHAGEAL ECHOCARDIOGRAM;  Surgeon: Melrose Nakayama, MD;  Location: [place];  Service: Open Heart Surgery;  Laterality: N/A;   IR ANGIOGRAM SELECTIVE EACH ADDITIONAL VESSEL  06/16/2020   IR ANGIOGRAM SELECTIVE EACH ADDITIONAL VESSEL  06/16/2020   IR ANGIOGRAM SELECTIVE EACH ADDITIONAL VESSEL  06/16/2020   IR ANGIOGRAM SELECTIVE EACH ADDITIONAL VESSEL  06/16/2020   IR ANGIOGRAM SELECTIVE EACH ADDITIONAL VESSEL  06/16/2020   IR ANGIOGRAM SELECTIVE EACH ADDITIONAL VESSEL  06/16/2020   IR ANGIOGRAM SELECTIVE EACH ADDITIONAL VESSEL  06/16/2020   IR ANGIOGRAM SELECTIVE EACH ADDITIONAL VESSEL  06/16/2020   IR ANGIOGRAM SELECTIVE EACH ADDITIONAL VESSEL  06/16/2020   IR ANGIOGRAM SELECTIVE EACH ADDITIONAL VESSEL  06/30/2020   IR ANGIOGRAM SELECTIVE EACH ADDITIONAL VESSEL  06/30/2020   IR ANGIOGRAM VISCERAL SELECTIVE  06/16/2020   IR ANGIOGRAM VISCERAL SELECTIVE  06/30/2020   IR EMBO ARTERIAL NOT HEMORR HEMANG INC GUIDE ROADMAPPING  06/16/2020   IR EMBO TUMOR ORGAN ISCHEMIA INFARCT INC GUIDE ROADMAPPING  06/30/2020   IR EMBO VENOUS  NOT HEMORR HEMANG  INC GUIDE ROADMAPPING  01/13/2021   IR IVUS EACH ADDITIONAL NON CORONARY VESSEL  01/13/2021   IR RADIOLOGIST EVAL & MGMT  05/06/2020   IR RADIOLOGIST EVAL & MGMT  05/20/2020   IR RADIOLOGIST EVAL & MGMT  05/25/2021   IR TIPS  01/13/2021   IR US GUIDE VASC ACCESS RIGHT  06/16/2020   IR US GUIDE VASC ACCESS RIGHT  06/30/2020   IR US GUIDE VASC ACCESS RIGHT  01/13/2021   LAPAROSCOPIC UMBILICAL HERNIA REPAIR W/ MESH  06-06-2011   LEFT HEART CATHETERIZATION WITH CORONARY ANGIOGRAM N/A 03/28/2014   Procedure: LEFT HEART CATHETERIZATION WITH CORONARY ANGIOGRAM;  Surgeon: Sinclair Grooms, MD;  Location: [facility] CATH LAB;  Service: Cardiovascular;  Laterality: N/A;   LIPOMA EXCISION Left 08/25/2017   Procedure: EXCISION LIPOMA LEFT POSTERIOR THIGH;  Surgeon: Clovis Riley, MD;  Location: [place];  Service: General;  Laterality: Left;   NEPHROLITHOTOMY  1990'S   OPEN APPENDECTOMY W/ PARTIAL CECECTOMY  05-16-2004   PERCUTANEOUS CORONARY STENT INTERVENTION (PCI-S) N/A 06/28/2012   Procedure: PERCUTANEOUS CORONARY STENT INTERVENTION (PCI-S);  Surgeon: Sherren Mocha, MD;  Location: [facility] CATH LAB;  Service: Cardiovascular;  Laterality: N/A;   POLYPECTOMY  11/20/2020   Procedure: POLYPECTOMY;  Surgeon: Ronald Lobo, MD;  Location: [facility];  Service: Endoscopy;;   POLYPECTOMY  05/02/2021   Procedure: POLYPECTOMY;  Surgeon: Arta Silence, MD;  Location: [facility];  Service: Endoscopy;;   RADIOLOGY WITH ANESTHESIA N/A 01/13/2021   Procedure: IR WITH ANESTHESIA - TIPS;  Surgeon: Suzette Battiest, MD;  Location: [place];  Service: Radiology;  Laterality: N/A;    Allergies: Fluzone quadrivalent [influenza vac split quad]  Medications: Prior to Admission medications   Medication Sig Start Date End Date Taking? Authorizing Provider  Accu-Chek Softclix Lancets lancets USE AS DIRECTED TO TEST BLOOD SUGAR FOUR TIMES DAILY 03/13/20   [name], MD  albuterol (VENTOLIN HFA) 108 (90 Base)  MCG/ACT inhaler Inhale 2 puffs into the lungs every 6 (six) hours as needed for wheezing or shortness of breath. 03/01/21   Aline August, MD  blood glucose meter kit and supplies Dispense based on patient and insurance preference. Use up to four times daily as directed. (FOR ICD-10 E10.9, E11.9). 03/12/20   [name], MD  ferrous sulfate 325 (65 FE) MG tablet Take 1 tablet (325 mg total) by mouth daily with breakfast. 05/06/21 08/04/21  Dahal, Marlowe Aschoff, MD  furosemide (LASIX) 40 MG tablet Take 1 tablet (40 mg total) by mouth daily. 05/05/21 08/03/21  [name], MD  lactulose (CHRONULAC) 10 GM/15ML solution Take 45 mLs (30 g total) by mouth 3 (three) times daily. 03/31/21   Debbe Odea, MD  nitroGLYCERIN (NITROSTAT) 0.4 MG SL tablet Place 1 tablet (0.4 mg total) under the tongue every 5 (five) minutes as needed for chest pain. 06/08/16   Nahser, Wonda Cheng, MD  pantoprazole (PROTONIX) 40 MG tablet Take 1 tablet (40 mg total) by mouth daily. 03/31/21   Debbe Odea, MD  spironolactone (ALDACTONE) 50 MG tablet Take 1 tablet (50 mg total) by mouth daily. 03/07/21   Ghimire, Henreitta Leber, MD     Family History  Problem Relation Age of Onset   Lung cancer Sister    Cancer Sister        lung   Cancer Mother    Cancer Father        died in his 102s.   Cancer Brother        lung   Coronary artery disease Other    Diabetes Other    Colon cancer Other    Cancer Sister        lung    Social History   Socioeconomic History   Marital status: Married    Spouse name: Not on file   Number of children: Not on file   Years of education: Not on file   Highest education level: Not on file  Occupational History   Not on file  Tobacco Use   Smoking status: Former    Packs/day: 2.00    Years: 40.00    Pack years: 80.00    Types: Cigarettes    Quit date: 08/29/1996    Years since quitting: 24.7   Smokeless tobacco: Never  Vaping Use   Vaping Use: Never used  Substance and Sexual Activity    Alcohol use: Not Currently    Alcohol/week: 8.0 standard drinks    Types: 8 Cans of beer per week    Comment: 2 beers every other day   Drug use: No   Sexual activity: Yes  Other Topics Concern   Not on file  Social  History Narrative   Lives in [place] with wife, son, and son's family.   Social Determinants of Health   Financial Resource Strain: Medium Risk   Difficulty of Paying Living Expenses: Somewhat hard  Food Insecurity: No Food Insecurity   Worried About Charity fundraiser in the Last Year: Never true   Ran Out of Food in the Last Year: Never true  Transportation Needs: No Transportation Needs   Lack of Transportation (Medical): No   Lack of Transportation (Non-Medical): No  Physical Activity: Not on file  Stress: Not on file  Social Connections: Not on file    Review of Systems: A 12 point ROS discussed and pertinent positives are indicated in the HPI above.  All other systems are negative.  Vital Signs: There were no vitals taken for this visit.  No physical examination performed in lieu of telephone visit.      Imaging: TIPS 01/13/21  8+2 Viatorr, no post-deployment balloon dilation.  Post-TIPS portosystemic gradient = 5 mmHg   Labs:  CBC: Recent Labs    05/03/21 0326 05/04/21 0910 05/05/21 0352 05/20/21 1444  WBC 1.9* 2.6* 2.4* 2.4*  HGB 7.4* 7.8* 7.3* 6.8*  HCT 24.3* 26.2* 24.6* 23.0*  23.0*  PLT 72* 77* 66* 167    COAGS: Recent Labs    01/22/21 1558 01/29/21 0445 03/28/21 1714 03/29/21 0312 04/29/21 0450 05/20/21 1443  INR 1.3*   < > 1.4* 1.3* 1.3* 1.3*  APTT 34  --   --   --   --   --    < > = values in this interval not displayed.    BMP: Recent Labs    06/01/20 1414 06/16/20 0845 08/25/20 1420 09/10/20 1442 05/03/21 0326 05/04/21 0910 05/05/21 0352 05/20/21 1444  NA 141   < > 139   < > 137 135 134* 138  K 3.7   < > 3.8   < > 3.8 3.7 3.5 3.8  CL 102   < > 102   < > 108 108 102 107  CO2 28   < > 24   < > '23 22 23 23   ' GLUCOSE 149*   < > 209*   < > 125* 164* 98 170*  BUN 24*   < > 26   < > '12 15 16 18  ' CALCIUM 8.5*   < > 8.7   < > 8.6* 8.4* 8.2* 8.7*  CREATININE 1.06   < > 1.35*   < > 0.86 1.02 1.00 1.13  GFRNONAA >60   < > 51*   < > >60 >60 >60 >60  GFRAA >60  --  59*  --   --   --   --   --    < > = values in this interval not displayed.    LIVER FUNCTION TESTS: Recent Labs    03/29/21 0312 04/28/21 1843 04/29/21 0450 05/20/21 1444  BILITOT 1.3* 1.2 1.3* 0.9  AST 39 29 26 43*  ALT '21 21 21 29  ' ALKPHOS 104 127* 118 222*  PROT 6.2* 6.9 7.0 6.6  ALBUMIN 2.6* 3.2* 3.3* 2.9*    TUMOR MARKERS: No results for input(s): AFPTM, CEA, CA199, CHROMGRNA in the last 8760 hours.  Assessment and Plan: 74 year old male with history of cirrhosis (Child Pugh A, MELD-Na 11) with associated hemorrhagic gastroesophageal varices in addition to history of hepatocellular carcinoma s/p TARE segmentectomy in November 2021.  He is now status post TIPS creation on 01/13/21. He  has continued to suffer from pancytopenia and anemia, with some evidence of GI tract etiology of blood loss.  Given extensive gastroesophageal varices and minimal TIPS stent dilation after placement, there maybe future room for further decompression of the portal system.  This was discussed with the patient and his wife who stated understanding.  -plan for CTA abdomen and pelvis (BRTO protocol) to assess for any prominent varices that may be the etiology of chronic anemia and GI bleeding that may be amenable to TIPS expansion +/- embolization.  This study will also follow up prior treated [facility], for which he is due. -pending results of CTA, may arrange for TIPS revision    Electronically Signed: Suzette Battiest 05/25/2021, 1:51 PM   I spent a total of   25 Minutes in telephone clinical consultation, greater than 50% of which was counseling/coordinating care for portal hypertension.

## 2021-05-28 ENCOUNTER — Emergency Department (HOSPITAL_COMMUNITY): Payer: Medicare HMO

## 2021-05-28 ENCOUNTER — Other Ambulatory Visit: Payer: Self-pay

## 2021-05-28 ENCOUNTER — Inpatient Hospital Stay (HOSPITAL_COMMUNITY)
Admission: EM | Admit: 2021-05-28 | Discharge: 2021-06-03 | DRG: 442 | Disposition: A | Discharge status: Home / Self Care | Payer: Medicare HMO | Attending: Internal Medicine | Admitting: Internal Medicine

## 2021-05-28 ENCOUNTER — Encounter (HOSPITAL_COMMUNITY): Payer: Self-pay

## 2021-05-28 DIAGNOSIS — Z887 Allergy status to serum and vaccine status: Secondary | ICD-10-CM

## 2021-05-28 DIAGNOSIS — D500 Iron deficiency anemia secondary to blood loss (chronic): Secondary | ICD-10-CM | POA: Diagnosis present

## 2021-05-28 DIAGNOSIS — I2510 Atherosclerotic heart disease of native coronary artery without angina pectoris: Secondary | ICD-10-CM | POA: Diagnosis not present

## 2021-05-28 DIAGNOSIS — E785 Hyperlipidemia, unspecified: Secondary | ICD-10-CM | POA: Diagnosis not present

## 2021-05-28 DIAGNOSIS — J4520 Mild intermittent asthma, uncomplicated: Secondary | ICD-10-CM | POA: Diagnosis not present

## 2021-05-28 DIAGNOSIS — I5032 Chronic diastolic (congestive) heart failure: Secondary | ICD-10-CM | POA: Diagnosis not present

## 2021-05-28 DIAGNOSIS — K7290 Hepatic failure, unspecified without coma: Secondary | ICD-10-CM

## 2021-05-28 DIAGNOSIS — E1122 Type 2 diabetes mellitus with diabetic chronic kidney disease: Secondary | ICD-10-CM | POA: Diagnosis present

## 2021-05-28 DIAGNOSIS — R69 Illness, unspecified: Secondary | ICD-10-CM | POA: Diagnosis not present

## 2021-05-28 DIAGNOSIS — I4891 Unspecified atrial fibrillation: Secondary | ICD-10-CM | POA: Diagnosis not present

## 2021-05-28 DIAGNOSIS — Z87820 Personal history of traumatic brain injury: Secondary | ICD-10-CM

## 2021-05-28 DIAGNOSIS — D61818 Other pancytopenia: Secondary | ICD-10-CM | POA: Diagnosis present

## 2021-05-28 DIAGNOSIS — D631 Anemia in chronic kidney disease: Secondary | ICD-10-CM | POA: Diagnosis not present

## 2021-05-28 DIAGNOSIS — N1831 Chronic kidney disease, stage 3a: Secondary | ICD-10-CM | POA: Diagnosis present

## 2021-05-28 DIAGNOSIS — R4189 Other symptoms and signs involving cognitive functions and awareness: Secondary | ICD-10-CM | POA: Diagnosis present

## 2021-05-28 DIAGNOSIS — E118 Type 2 diabetes mellitus with unspecified complications: Secondary | ICD-10-CM | POA: Diagnosis not present

## 2021-05-28 DIAGNOSIS — E162 Hypoglycemia, unspecified: Secondary | ICD-10-CM | POA: Diagnosis not present

## 2021-05-28 DIAGNOSIS — I10 Essential (primary) hypertension: Secondary | ICD-10-CM | POA: Diagnosis not present

## 2021-05-28 DIAGNOSIS — E66811 Obesity, class 1: Secondary | ICD-10-CM | POA: Insufficient documentation

## 2021-05-28 DIAGNOSIS — Z20822 Contact with and (suspected) exposure to covid-19: Secondary | ICD-10-CM | POA: Diagnosis not present

## 2021-05-28 DIAGNOSIS — K7460 Unspecified cirrhosis of liver: Secondary | ICD-10-CM | POA: Diagnosis not present

## 2021-05-28 DIAGNOSIS — J811 Chronic pulmonary edema: Secondary | ICD-10-CM | POA: Diagnosis not present

## 2021-05-28 DIAGNOSIS — G4733 Obstructive sleep apnea (adult) (pediatric): Secondary | ICD-10-CM | POA: Diagnosis present

## 2021-05-28 DIAGNOSIS — Z6832 Body mass index (BMI) 32.0-32.9, adult: Secondary | ICD-10-CM | POA: Diagnosis not present

## 2021-05-28 DIAGNOSIS — Z87891 Personal history of nicotine dependence: Secondary | ICD-10-CM

## 2021-05-28 DIAGNOSIS — M1990 Unspecified osteoarthritis, unspecified site: Secondary | ICD-10-CM | POA: Diagnosis not present

## 2021-05-28 DIAGNOSIS — Z951 Presence of aortocoronary bypass graft: Secondary | ICD-10-CM

## 2021-05-28 DIAGNOSIS — K7200 Acute and subacute hepatic failure without coma: Principal | ICD-10-CM | POA: Diagnosis present

## 2021-05-28 DIAGNOSIS — Z743 Need for continuous supervision: Secondary | ICD-10-CM | POA: Diagnosis not present

## 2021-05-28 DIAGNOSIS — Z8505 Personal history of malignant neoplasm of liver: Secondary | ICD-10-CM

## 2021-05-28 DIAGNOSIS — K7682 Hepatic encephalopathy: Secondary | ICD-10-CM | POA: Diagnosis present

## 2021-05-28 DIAGNOSIS — I272 Pulmonary hypertension, unspecified: Secondary | ICD-10-CM | POA: Diagnosis present

## 2021-05-28 DIAGNOSIS — Z9049 Acquired absence of other specified parts of digestive tract: Secondary | ICD-10-CM

## 2021-05-28 DIAGNOSIS — K7210 Chronic hepatic failure without coma: Secondary | ICD-10-CM | POA: Diagnosis present

## 2021-05-28 DIAGNOSIS — I130 Hypertensive heart and chronic kidney disease with heart failure and stage 1 through stage 4 chronic kidney disease, or unspecified chronic kidney disease: Secondary | ICD-10-CM | POA: Diagnosis present

## 2021-05-28 DIAGNOSIS — K219 Gastro-esophageal reflux disease without esophagitis: Secondary | ICD-10-CM | POA: Diagnosis not present

## 2021-05-28 DIAGNOSIS — M109 Gout, unspecified: Secondary | ICD-10-CM | POA: Diagnosis not present

## 2021-05-28 DIAGNOSIS — D509 Iron deficiency anemia, unspecified: Secondary | ICD-10-CM | POA: Diagnosis present

## 2021-05-28 DIAGNOSIS — Z79899 Other long term (current) drug therapy: Secondary | ICD-10-CM

## 2021-05-28 DIAGNOSIS — Z9114 Patient's other noncompliance with medication regimen: Secondary | ICD-10-CM

## 2021-05-28 DIAGNOSIS — K7030 Alcoholic cirrhosis of liver without ascites: Secondary | ICD-10-CM | POA: Diagnosis not present

## 2021-05-28 DIAGNOSIS — G8929 Other chronic pain: Secondary | ICD-10-CM | POA: Diagnosis not present

## 2021-05-28 DIAGNOSIS — E161 Other hypoglycemia: Secondary | ICD-10-CM | POA: Diagnosis not present

## 2021-05-28 DIAGNOSIS — E669 Obesity, unspecified: Secondary | ICD-10-CM | POA: Diagnosis not present

## 2021-05-28 DIAGNOSIS — Z955 Presence of coronary angioplasty implant and graft: Secondary | ICD-10-CM

## 2021-05-28 DIAGNOSIS — R4182 Altered mental status, unspecified: Secondary | ICD-10-CM | POA: Diagnosis not present

## 2021-05-28 DIAGNOSIS — M549 Dorsalgia, unspecified: Secondary | ICD-10-CM | POA: Diagnosis not present

## 2021-05-28 DIAGNOSIS — R404 Transient alteration of awareness: Secondary | ICD-10-CM | POA: Diagnosis not present

## 2021-05-28 LAB — COMPREHENSIVE METABOLIC PANEL
ALT: 24 U/L (ref 0–44)
AST: 34 U/L (ref 15–41)
Albumin: 3.3 g/dL — ABNORMAL LOW (ref 3.5–5.0)
Alkaline Phosphatase: 163 U/L — ABNORMAL HIGH (ref 38–126)
Anion gap: 7 (ref 5–15)
BUN: 23 mg/dL (ref 8–23)
CO2: 21 mmol/L — ABNORMAL LOW (ref 22–32)
Calcium: 8.8 mg/dL — ABNORMAL LOW (ref 8.9–10.3)
Chloride: 111 mmol/L (ref 98–111)
Creatinine, Ser: 1.13 mg/dL (ref 0.61–1.24)
GFR, Estimated: >60 mL/min (ref >60)
Glucose, Bld: 137 mg/dL — ABNORMAL HIGH (ref 70–99)
Potassium: 4.5 mmol/L (ref 3.5–5.1)
Sodium: 139 mmol/L (ref 135–145)
Total Bilirubin: 0.8 mg/dL (ref 0.3–1.2)
Total Protein: 6.9 g/dL (ref 6.5–8.1)

## 2021-05-28 LAB — URINALYSIS, ROUTINE W REFLEX MICROSCOPIC
Bilirubin Urine: NEGATIVE
Glucose, UA: NEGATIVE mg/dL
Ketones, ur: NEGATIVE mg/dL
Nitrite: NEGATIVE
Protein, ur: NEGATIVE mg/dL
Specific Gravity, Urine: 1.009 (ref 1.005–1.030)
pH: 7 (ref 5.0–8.0)

## 2021-05-28 LAB — CBC WITH DIFFERENTIAL/PLATELET
Abs Immature Granulocytes: 0.02 10*3/uL (ref 0.00–0.07)
Basophils Absolute: 0 10*3/uL (ref 0.0–0.1)
Basophils Relative: 0 %
Eosinophils Absolute: 0.1 10*3/uL (ref 0.0–0.5)
Eosinophils Relative: 3 %
HCT: 24.1 % — ABNORMAL LOW (ref 39.0–52.0)
Hemoglobin: 7.1 g/dL — ABNORMAL LOW (ref 13.0–17.0)
Immature Granulocytes: 1 %
Lymphocytes Relative: 10 %
Lymphs Abs: 0.3 10*3/uL — ABNORMAL LOW (ref 0.7–4.0)
MCH: 25.7 pg — ABNORMAL LOW (ref 26.0–34.0)
MCHC: 29.5 g/dL — ABNORMAL LOW (ref 30.0–36.0)
MCV: 87.3 fL (ref 80.0–100.0)
Monocytes Absolute: 0.3 10*3/uL (ref 0.1–1.0)
Monocytes Relative: 9 %
Neutro Abs: 2.4 10*3/uL (ref 1.7–7.7)
Neutrophils Relative %: 77 %
Platelets: 121 10*3/uL — ABNORMAL LOW (ref 150–400)
RBC: 2.76 MIL/uL — ABNORMAL LOW (ref 4.22–5.81)
RDW: 23.9 % — ABNORMAL HIGH (ref 11.5–15.5)
WBC: 3 10*3/uL — ABNORMAL LOW (ref 4.0–10.5)
nRBC: 0 % (ref 0.0–0.2)

## 2021-05-28 LAB — RAPID URINE DRUG SCREEN, HOSP PERFORMED
Amphetamines: NOT DETECTED
Barbiturates: NOT DETECTED
Benzodiazepines: NOT DETECTED
Cocaine: NOT DETECTED
Opiates: NOT DETECTED
Tetrahydrocannabinol: NOT DETECTED

## 2021-05-28 LAB — RESP PANEL BY RT-PCR (FLU A&B, COVID) ARPGX2
Influenza A by PCR: NEGATIVE
Influenza B by PCR: NEGATIVE
SARS Coronavirus 2 by RT PCR: NEGATIVE

## 2021-05-28 LAB — AMMONIA: Ammonia: 203 umol/L — ABNORMAL HIGH (ref 9–35)

## 2021-05-28 LAB — GLUCOSE, CAPILLARY
Glucose-Capillary: 136 mg/dL — ABNORMAL HIGH (ref 70–99)
Glucose-Capillary: 110 mg/dL — ABNORMAL HIGH (ref 70–99)
Glucose-Capillary: 123 mg/dL — ABNORMAL HIGH (ref 70–99)

## 2021-05-28 LAB — CBG MONITORING, ED: Glucose-Capillary: 132 mg/dL — ABNORMAL HIGH (ref 70–99)

## 2021-05-28 LAB — BRAIN NATRIURETIC PEPTIDE: B Natriuretic Peptide: 283.5 pg/mL — ABNORMAL HIGH (ref 0.0–100.0)

## 2021-05-28 LAB — ETHANOL: Alcohol, Ethyl (B): <10 mg/dL (ref <10)

## 2021-05-28 MED ORDER — INSULIN ASPART 100 UNIT/ML IJ SOLN
0.0000 [IU] | Freq: Three times a day (TID) | INTRAMUSCULAR | Status: DC
  Administered 2021-05-30: 2 [IU] via SUBCUTANEOUS
  Administered 2021-05-31 (×2): 1 [IU] via SUBCUTANEOUS

## 2021-05-28 MED ORDER — ONDANSETRON HCL 4 MG/2ML IJ SOLN: 4.0000 mg | Freq: Four times a day (QID) | INTRAMUSCULAR | Status: DC | PRN

## 2021-05-28 MED ORDER — FUROSEMIDE 10 MG/ML IJ SOLN
40.0000 mg | Freq: Every day | INTRAMUSCULAR | Status: DC
Start: 2021-05-29 — End: 2021-05-29
  Administered 2021-05-29: 40 mg via INTRAVENOUS
  Filled 2021-05-28: qty 4

## 2021-05-28 MED ORDER — LACTULOSE ENEMA
300.0000 mL | Freq: Two times a day (BID) | ORAL | Status: DC
  Administered 2021-05-28: 300 mL via RECTAL
  Filled 2021-05-28 (×3): qty 300

## 2021-05-28 MED ORDER — FUROSEMIDE 10 MG/ML IJ SOLN
40.0000 mg | Freq: Once | INTRAMUSCULAR | Status: AC
  Administered 2021-05-28: 40 mg via INTRAVENOUS
  Filled 2021-05-28: qty 4

## 2021-05-28 MED ORDER — ONDANSETRON HCL 4 MG PO TABS: 4.0000 mg | ORAL_TABLET | Freq: Four times a day (QID) | ORAL | Status: DC | PRN

## 2021-05-28 MED ORDER — CHLORHEXIDINE GLUCONATE CLOTH 2 % EX PADS
6.0000 | MEDICATED_PAD | Freq: Every day | CUTANEOUS | Status: DC
  Administered 2021-05-28 – 2021-06-03 (×5): 6 via TOPICAL

## 2021-05-28 MED ORDER — LACTULOSE ENEMA
300.0000 mL | Freq: Once | ORAL | Status: AC
  Administered 2021-05-28: 300 mL via RECTAL
  Filled 2021-05-28: qty 300

## 2021-05-28 NOTE — ED Provider Notes (Signed)
[facility] DEPT Provider Note   CSN: [id number] Arrival date & time: 05/28/21  0108  LEVEL 5 CAVEAT - ALTERED MENTAL STATUS   History Chief Complaint  Patient presents with   Altered Mental Status    AMIIR [name] is a 75 y.o. male.  HPI 75 year old male presents with altered mental status.  History is from the nurses spoke to EMS.  Family called because the patient was altered tonight.  They report that this is similar to prior hepatic encephalopathy.  Otherwise, no family is present and EMS is now gone.  Patient is able to wake up and mumble but is hard to understand.  Level 5 caveat.  After initial work-up started, I did discuss with the son over the phone.  The patient has been altered and sleepy all day similar to prior hepatic encephalopathy.  Son suspects he is not taking his lactulose.  He also does not think he ever went and got the blood transfusion that was ordered recently.  He complained of being cold today but has not actually had a fever or any other infectious symptoms such as vomiting, cough or focal complaints besides chronic back pain.  Past Medical History:  Diagnosis Date   Arthritis    "joints tighten up sometimes" (03/27/2104)   Carcinoma of liver, hepatocellular ([place]) 06/30/2020   Chronic lower back pain    Coronary artery disease involving native coronary artery of native heart without angina pectoris    Severe left main disease at catheterization July 2015  CABG x3 with a LIMA to the LAD, SVG to the OM, SVG to the PDA on 03/31/14. EF 60% by cath.    Diastolic heart failure (HCC)    GERD without esophagitis 08/04/2010   Hepatic cirrhosis ([place])    a. Dx 01/2014 - CT a/p    History of blood transfusion    "related to bleeding ulcers"   History of concussion    1976--  NO RESIDUAL   History of GI bleed    a. UGIB 07/2012;  b. 01/2014 admission with GIB/FOB stool req 1U prbc's->EGD showed portal gastropathy, barrett's esoph, and  chronic active h. pylori gastritis.   History of gout    2007 &  2008  LEFT LEG-- NO ISSUE SINCE   Hyperlipidemia    Iron deficiency anemia    Kidney stones    OA (osteoarthritis of spine)    LOWER BACK--  INTERMITTANT LEFT LEG NUMBNESS   OSA (obstructive sleep apnea)    PULMOLOGIST-  DR CLANCE--  MODERATE OSA  STARTED CPAP 2012--  BUT CURRENTLY HAS NOT USED PAST 6 MONTHS   Phimosis    a. s/p circumcision 2015.   Type 2 diabetes mellitus ([place])    Unspecified essential hypertension     Patient Active Problem List   Diagnosis Date Noted   Acute upper GI bleed 04/29/2021   Cirrhosis ([place]) 04/29/2021   Acute on chronic anemia 04/28/2021   CKD stage 3 due to type 2 diabetes mellitus ([place]) 02/27/2021   Hepatic encephalopathy ([place]) 01/22/2021   GI bleeding 09/10/2020   Hepatocellular carcinoma ([place]) 08/09/2020   Routine general medical examination at a health care facility 05/06/2020   Iron deficiency anemia    Pancytopenia ([place]) 04/15/2020   Pulmonary hypertension, unspecified ([place]) 03/11/2020   Asthma, mild intermittent 51/76/1607   Alcoholic cirrhosis of liver with ascites ([place]) 12/27/2019   Acute on chronic diastolic CHF (congestive heart failure) ([place]) 11/01/2019   (HFpEF) heart failure  with preserved ejection fraction ([place]) 12/24/2018   CKD (chronic kidney disease), stage III ([place]) 12/18/2017   Peptic ulcer disease 12/18/2017   GI bleed 09/29/2017   SOB (shortness of breath) 09/21/2017   Class 2 obesity due to excess calories with body mass index (BMI) of 36.0 to 36.9 in adult 02/05/2016   S/P CABG x 3 03/31/2014   Melena 08/14/2012   Hyperlipidemia with target LDL less than 70 06/29/2012   Thrombocytopenia ([place]) 06/29/2012   Coronary artery disease involving native coronary artery of native heart without angina pectoris    OSA (obstructive sleep apnea)    Type 2 diabetes with complication ([place]) 37/48/2707   GERD without esophagitis 08/04/2010   History of gout     Hypertensive heart disease     Past Surgical History:  Procedure Laterality Date   ANKLE FRACTURE SURGERY Right 1989   "plate put in"   APPENDECTOMY  05-16-2004   open   BIOPSY  01/08/2021   Procedure: BIOPSY;  Surgeon: Wilford Corner, MD;  Location: [facility];  Service: Endoscopy;;   BIOPSY  05/02/2021   Procedure: BIOPSY;  Surgeon: Arta Silence, MD;  Location: [facility];  Service: Endoscopy;;   CIRCUMCISION N/A 09/09/2013   Procedure: CIRCUMCISION ADULT;  Surgeon: Bernestine Amass, MD;  Location: [facility];  Service: Urology;  Laterality: N/A;   COLECTOMY  05-16-2004   COLONOSCOPY N/A 05/02/2021   Procedure: COLONOSCOPY;  Surgeon: Arta Silence, MD;  [facility]: [facility];  Service: Endoscopy;  Laterality: N/A;   COLONOSCOPY WITH PROPOFOL N/A 11/20/2020   Procedure: COLONOSCOPY WITH PROPOFOL;  Surgeon: Ronald Lobo, MD;  Location: [facility];  Service: Endoscopy;  Laterality: N/A;   CORONARY ANGIOPLASTY WITH STENT PLACEMENT  06/28/2012  DR COOPER   PCI W/  X1 DES to [place]. LAD/  LM  40% OSTIAL & 50-60% DISTAL /  50% PROX LCX/  30-40% PROX RCA & 50% MID RCA/   LVEF 65-70%   CORONARY ARTERY BYPASS GRAFT N/A 03/31/2014   Procedure: CORONARY ARTERY BYPASS GRAFTING (CABG) times 3 using left internal mammary artery and right saphenous vein.;  Surgeon: Melrose Nakayama, MD;  Location: [facility];  Service: Open Heart Surgery;  Laterality: N/A;   DOBUTAMINE STRESS ECHO  06-08-2012   MODERATE HYPOKINESIS/ ISCHEMIA MID INFERIOR WALL   ESOPHAGOGASTRODUODENOSCOPY  08/15/2012   Procedure: ESOPHAGOGASTRODUODENOSCOPY (EGD);  Surgeon: Wonda Horner, MD;  Location[facility] ENDOSCOPY;  Service: Endoscopy;  Laterality: N/A;   ESOPHAGOGASTRODUODENOSCOPY N/A 02/17/2014   Procedure: ESOPHAGOGASTRODUODENOSCOPY (EGD);  Surgeon: Jeryl Columbia, MD;  Location[facility] ENDOSCOPY;  Service: Endoscopy;  Laterality: N/A;   ESOPHAGOGASTRODUODENOSCOPY N/A 04/17/2020   Procedure: ESOPHAGOGASTRODUODENOSCOPY  (EGD);  Surgeon: Ronnette Juniper, MD;  Location: [facility];  Service: Gastroenterology;  Laterality: N/A;   ESOPHAGOGASTRODUODENOSCOPY N/A 09/11/2020   Procedure: ESOPHAGOGASTRODUODENOSCOPY (EGD);  Surgeon: Clarene Essex, MD;  Location: [facility];  Service: Endoscopy;  Laterality: N/A;   ESOPHAGOGASTRODUODENOSCOPY  10/09/2020   ESOPHAGOGASTRODUODENOSCOPY N/A 10/09/2020   Procedure: ESOPHAGOGASTRODUODENOSCOPY (EGD);  Surgeon: Otis Brace, MD;  Location: [facility] ENDOSCOPY;  Service: Gastroenterology;  Laterality: N/A;   ESOPHAGOGASTRODUODENOSCOPY N/A 01/08/2021   Procedure: ESOPHAGOGASTRODUODENOSCOPY (EGD);  Surgeon: Wilford Corner, MD;  Location: [facility];  Service: Endoscopy;  Laterality: N/A;   ESOPHAGOGASTRODUODENOSCOPY N/A 05/02/2021   Procedure: ESOPHAGOGASTRODUODENOSCOPY (EGD);  Surgeon: Arta Silence, MD;  Location: [facility];  Service: Endoscopy;  Laterality: N/A;   ESOPHAGOGASTRODUODENOSCOPY (EGD) WITH PROPOFOL N/A 09/30/2017   Procedure: ESOPHAGOGASTRODUODENOSCOPY (EGD) WITH PROPOFOL;  Surgeon: Wonda Horner, MD;  Location: [facility]OS[facility];  Service: Endoscopy;  Laterality: N/A;   ESOPHAGOGASTRODUODENOSCOPY (EGD) WITH PROPOFOL N/A 12/20/2017   Procedure: ESOPHAGOGASTRODUODENOSCOPY (EGD) WITH PROPOFOL;  Surgeon: Clarene Essex, MD;  Location: [facility];  Service: Endoscopy;  Laterality: N/A;   ESOPHAGOGASTRODUODENOSCOPY (EGD) WITH PROPOFOL N/A 08/10/2020   Procedure: ESOPHAGOGASTRODUODENOSCOPY (EGD) WITH PROPOFOL;  Surgeon: Wilford Corner, MD;  Location: [facility];  Service: Endoscopy;  Laterality: N/A;   ESOPHAGOGASTRODUODENOSCOPY (EGD) WITH PROPOFOL N/A 11/20/2020   Procedure: ESOPHAGOGASTRODUODENOSCOPY (EGD) WITH PROPOFOL;  Surgeon: Ronald Lobo, MD;  [facility];  Service: Endoscopy;  Laterality: N/A;   ESOPHAGOGASTRODUODENOSCOPY (EGD) WITH PROPOFOL N/A 02/28/2021   Procedure: ESOPHAGOGASTRODUODENOSCOPY (EGD) WITH PROPOFOL;  Surgeon: Clarene Essex, MD;  [facility];  Service: Endoscopy;  Laterality: N/A;   GIVENS CAPSULE STUDY N/A 11/20/2020   Procedure: GIVENS CAPSULE STUDY;  Surgeon: Ronald Lobo, MD;  Location: [facility];  Service: Endoscopy;  Laterality: N/A;   HEMOSTASIS CLIP PLACEMENT  05/02/2021   Procedure: HEMOSTASIS CLIP PLACEMENT;  Surgeon: Arta Silence, MD;  [facility];  Service: Endoscopy;;   HERNIA REPAIR     INTRAOPERATIVE TRANSESOPHAGEAL ECHOCARDIOGRAM N/A 03/31/2014   Procedure: INTRAOPERATIVE TRANSESOPHAGEAL ECHOCARDIOGRAM;  Surgeon: Melrose Nakayama, MD;  Location: [facility];  Service: Open Heart Surgery;  Laterality: N/A;   IR ANGIOGRAM SELECTIVE EACH ADDITIONAL VESSEL  06/16/2020   IR ANGIOGRAM SELECTIVE EACH ADDITIONAL VESSEL  06/16/2020   IR ANGIOGRAM SELECTIVE EACH ADDITIONAL VESSEL  06/16/2020   IR ANGIOGRAM SELECTIVE EACH ADDITIONAL VESSEL  06/16/2020   IR ANGIOGRAM SELECTIVE EACH ADDITIONAL VESSEL  06/16/2020   IR ANGIOGRAM SELECTIVE EACH ADDITIONAL VESSEL  06/16/2020   IR ANGIOGRAM SELECTIVE EACH ADDITIONAL VESSEL  06/16/2020   IR ANGIOGRAM SELECTIVE EACH ADDITIONAL VESSEL  06/16/2020   IR ANGIOGRAM SELECTIVE EACH ADDITIONAL VESSEL  06/16/2020   IR ANGIOGRAM SELECTIVE EACH ADDITIONAL VESSEL  06/30/2020   IR ANGIOGRAM SELECTIVE EACH ADDITIONAL VESSEL  06/30/2020   IR ANGIOGRAM VISCERAL SELECTIVE  06/16/2020   IR ANGIOGRAM VISCERAL SELECTIVE  06/30/2020   IR EMBO ARTERIAL NOT HEMORR HEMANG INC GUIDE ROADMAPPING  06/16/2020   IR EMBO TUMOR ORGAN ISCHEMIA INFARCT INC GUIDE ROADMAPPING  06/30/2020   IR EMBO VENOUS NOT HEMORR HEMANG  INC GUIDE ROADMAPPING  01/13/2021   IR IVUS EACH ADDITIONAL NON CORONARY VESSEL  01/13/2021   IR RADIOLOGIST EVAL & MGMT  05/06/2020   IR RADIOLOGIST EVAL & MGMT  05/20/2020   IR RADIOLOGIST EVAL & MGMT  05/25/2021   IR TIPS  01/13/2021   IR US GUIDE VASC ACCESS RIGHT  06/16/2020   IR US GUIDE VASC ACCESS RIGHT  06/30/2020   IR US GUIDE VASC ACCESS RIGHT  01/13/2021   LAPAROSCOPIC  UMBILICAL HERNIA REPAIR W/ MESH  06-06-2011   LEFT HEART CATHETERIZATION WITH CORONARY ANGIOGRAM N/A 03/28/2014   Procedure: LEFT HEART CATHETERIZATION WITH CORONARY ANGIOGRAM;  Surgeon: Sinclair Grooms, MD;  Location: [facility] CATH LAB;  Service: Cardiovascular;  Laterality: N/A;   LIPOMA EXCISION Left 08/25/2017   Procedure: EXCISION LIPOMA LEFT POSTERIOR THIGH;  Surgeon: Clovis Riley, MD;  Location: [facility];  Service: General;  Laterality: Left;   NEPHROLITHOTOMY  1990'S   OPEN APPENDECTOMY W/ PARTIAL CECECTOMY  05-16-2004   PERCUTANEOUS CORONARY STENT INTERVENTION (PCI-S) N/A 06/28/2012   Procedure: PERCUTANEOUS CORONARY STENT INTERVENTION (PCI-S);  Surgeon: Sherren Mocha, MD;  Location: [facility] CATH LAB;  Service: Cardiovascular;  Laterality: N/A;   POLYPECTOMY  11/20/2020   Procedure: POLYPECTOMY;  Surgeon: Ronald Lobo, MD;  [facility];  Service: Endoscopy;;   POLYPECTOMY  05/02/2021   Procedure: POLYPECTOMY;  Surgeon: Arta Silence, MD;  [facility];  Service: Endoscopy;;   RADIOLOGY WITH ANESTHESIA N/A 01/13/2021   Procedure: IR WITH ANESTHESIA - TIPS;  Surgeon: Suzette Battiest, MD;  Location: [facility];  Service: Radiology;  Laterality: N/A;       Family History  Problem Relation Age of Onset   Lung cancer Sister    Cancer Sister        lung   Cancer Mother    Cancer Father        died in his 51s.   Cancer Brother        lung   Coronary artery disease Other    Diabetes Other    Colon cancer Other    Cancer Sister        lung    Social History   Tobacco Use   Smoking status: Former    Packs/day: 2.00    Years: 40.00    Pack years: 80.00    Types: Cigarettes    Quit date: 08/29/1996    Years since quitting: 24.7   Smokeless tobacco: Never  Vaping Use   Vaping Use: Never used  Substance Use Topics   Alcohol use: Not Currently    Alcohol/week: 8.0 standard drinks    Types: 8 Cans of beer per week    Comment: 2 beers every other day   Drug use:  No    Home Medications Prior to Admission medications   Medication Sig Start Date End Date Taking? Authorizing Provider  Accu-Chek Softclix Lancets lancets USE AS DIRECTED TO TEST BLOOD SUGAR FOUR TIMES DAILY 03/13/20   [name], MD  albuterol (VENTOLIN HFA) 108 (90 Base) MCG/ACT inhaler Inhale 2 puffs into the lungs every 6 (six) hours as needed for wheezing or shortness of breath. 03/01/21   Aline August, MD  blood glucose meter kit and supplies Dispense based on patient and insurance preference. Use up to four times daily as directed. (FOR ICD-10 E10.9, E11.9). 03/12/20   [name], MD  ferrous sulfate 325 (65 FE) MG tablet Take 1 tablet (325 mg total) by mouth daily with breakfast. 05/06/21 08/04/21  Dahal, Marlowe Aschoff, MD  furosemide (LASIX) 40 MG tablet Take 1 tablet (40 mg total) by mouth daily. 05/05/21 08/03/21  [name], MD  lactulose (CHRONULAC) 10 GM/15ML solution Take 45 mLs (30 g total) by mouth 3 (three) times daily. 03/31/21   Debbe Odea, MD  nitroGLYCERIN (NITROSTAT) 0.4 MG SL tablet Place 1 tablet (0.4 mg total) under the tongue every 5 (five) minutes as needed for chest pain. 06/08/16   Nahser, Wonda Cheng, MD  pantoprazole (PROTONIX) 40 MG tablet Take 1 tablet (40 mg total) by mouth daily. 03/31/21   Debbe Odea, MD  spironolactone (ALDACTONE) 50 MG tablet Take 1 tablet (50 mg total) by mouth daily. 03/07/21   Ghimire, Henreitta Leber, MD    Allergies    Fluzone quadrivalent [influenza vac split quad]  Review of Systems   Review of Systems  Unable to perform ROS: Mental status change   Physical Exam Updated Vital Signs BP (!) 163/62   Pulse 81   Temp (!) 97.5 F (36.4 C) (Oral)   Resp 20   Ht '5\' 8"'  (1.727 m)   Wt 103.5 kg   SpO2 99%   BMI 34.69 kg/m   Physical Exam Vitals and nursing note reviewed.  Constitutional:  Appearance: He is well-developed.  HENT:     Head: Normocephalic and atraumatic.     Right Ear: External ear normal.     Left  Ear: External ear normal.     Nose: Nose normal.  Eyes:     General:        Right eye: No discharge.        Left eye: No discharge.  Cardiovascular:     Rate and Rhythm: Normal rate and regular rhythm.     Heart sounds: Normal heart sounds.  Pulmonary:     Effort: Pulmonary effort is normal.     Breath sounds: Rhonchi present.  Abdominal:     Palpations: Abdomen is soft.     Tenderness: There is no abdominal tenderness.  Musculoskeletal:     Cervical back: Neck supple.     Right lower leg: Edema present.     Left lower leg: Edema present.  Skin:    General: Skin is warm and dry.  Neurological:     Mental Status: He is lethargic.     Comments: Patient tries to speak to me and seems indicate that he is in the hospital but he is difficult to understand.  He does move both upper extremities but does not really follow commands.  He does not seem to move the lower extremities though he can state that he can feel me touching him.  Psychiatric:        Mood and Affect: Mood is not anxious.    ED Results / Procedures / Treatments   Labs (all labs ordered are listed, but only abnormal results are displayed) Labs Reviewed  COMPREHENSIVE METABOLIC PANEL - Abnormal; Notable for the following components:      Result Value   CO2 21 (*)    Glucose, Bld 137 (*)    Calcium 8.8 (*)    Albumin 3.3 (*)    Alkaline Phosphatase 163 (*)    All other components within normal limits  CBC WITH DIFFERENTIAL/PLATELET - Abnormal; Notable for the following components:   WBC 3.0 (*)    RBC 2.76 (*)    Hemoglobin 7.1 (*)    HCT 24.1 (*)    MCH 25.7 (*)    MCHC 29.5 (*)    RDW 23.9 (*)    Platelets 121 (*)    Lymphs Abs 0.3 (*)    All other components within normal limits  AMMONIA - Abnormal; Notable for the following components:   Ammonia 203 (*)    All other components within normal limits  BRAIN NATRIURETIC PEPTIDE - Abnormal; Notable for the following components:   B Natriuretic Peptide 283.5  (*)    All other components within normal limits  CBG MONITORING, ED - Abnormal; Notable for the following components:   Glucose-Capillary 132 (*)    All other components within normal limits  RESP PANEL BY RT-PCR (FLU A&B, COVID) [id number]  ETHANOL  URINALYSIS, ROUTINE W REFLEX MICROSCOPIC  RAPID URINE DRUG SCREEN, HOSP PERFORMED    EKG EKG Interpretation  Date/Time:  Friday May 28 2021 01:16:50 EDT Ventricular Rate:  73 PR Interval:    QRS Duration: 109 QT Interval:  434 QTC Calculation: 479 R Axis:   64 Text Interpretation: Atrial fibrillation Anteroseptal infarct, age indeterminate Confirmed by Sherwood Gambler [phone]) on 05/28/2021 1:55:01 AM  Radiology CT HEAD WO CONTRAST (5MM)  Result Date: 05/28/2021 CLINICAL DATA:  Altered mental status, hepatic failure EXAM: CT HEAD WITHOUT CONTRAST TECHNIQUE: Contiguous axial images were obtained  from the base of the skull through the vertex without intravenous contrast. COMPARISON:  01/22/2021 FINDINGS: Brain: Normal anatomic configuration. Parenchymal volume loss is commensurate with the patient's age. Moderate subcortical and periventricular white matter changes are present likely reflecting the sequela of small vessel ischemia. No abnormal intra or extra-axial mass lesion or fluid collection. No abnormal mass effect or midline shift. No evidence of acute intracranial hemorrhage or cortical infarct. Ventricular size is normal. Cerebellum unremarkable. Vascular: No asymmetric hyperdense vasculature at the skull base. Skull: Intact Sinuses/Orbits: Paranasal sinuses are clear. Orbits are unremarkable. Other: Mastoid air cells and middle ear cavities are clear. IMPRESSION: No acute intracranial abnormality. Moderate white matter change, likely stable since prior examination when accounting for technical differences and likely the sequela of small vessel ischemia. Electronically Signed   By: Fidela Salisbury M.D.   On: 05/28/2021 04:18   DG Chest  Portable 1 View  Result Date: 05/28/2021 CLINICAL DATA:  Altered mental status EXAM: PORTABLE CHEST 1 VIEW COMPARISON:  04/29/2021 FINDINGS: The lungs are symmetrically expanded. No pneumothorax or pleural effusion. Coronary artery bypass grafting has been performed. Cardiac size within normal limits. Mild bilateral perihilar and lower lung zone interstitial pulmonary infiltrate is present, progressive since prior examination, most in keeping with mild cardiogenic failure. No acute bone abnormality. IMPRESSION: Interval development of trace interstitial pulmonary edema, most in keeping with developing mild cardiogenic failure. Electronically Signed   By: Fidela Salisbury M.D.   On: 05/28/2021 02:09    Procedures Procedures   Medications Ordered in ED Medications  lactulose [facility]) enema 200 gm (300 mLs Rectal Given 05/28/21 0430)  furosemide (LASIX) injection 40 mg (40 mg Intravenous Given 05/28/21 0459)    ED Course  I have reviewed the triage vital signs and the nursing notes.  Pertinent labs & imaging results that were available during my care of the patient were reviewed by me and considered in my medical decision making (see chart for details).    MDM Rules/Calculators/A&P                           Patient appears to have hepatic encephalopathy.  He is awake but clearly altered.  He will need lactulose enema given mental status change and questionable ability to swallow.  Otherwise, seems most likely that this is a noncompliance issue.  Doubt infection.  Work-up is otherwise fairly unremarkable. Discussed with Dr. Alcario Drought for admission. Final Clinical Impression(s) / ED Diagnoses Final diagnoses:  Hepatic encephalopathy [facility])    Rx / DC Orders ED Discharge Orders     None        Sherwood Gambler, MD 05/28/21 [phone]

## 2021-05-28 NOTE — ED Notes (Signed)
Charge nurse contacted house supervisor to obtain flex-a-seal for lactulose administration

## 2021-05-28 NOTE — ED Triage Notes (Signed)
Pt to ED via [facility] from home for altered mental status called in by family.  Hx of liver cancer/failure.  A+Ox4 but slow to respond.

## 2021-05-28 NOTE — H&P (Signed)
History and Physical    [name] ASN:[id number] DOB: [birth year] DOA: 05/28/2021  PCP: [name], MD  Patient coming from: Home  Chief Complaint: altered mental status  HPI: [name] is a 75 y.o. male with medical history significant of cirrhosis of liver, CAD, diastolic HF, hepatocellular carcinoma, DM2. Presenting with altered mental status. History is from chart review and patient is somnolent and there is no family available at this time. Family called EMS because the patient had been lethargic all day. Apparently this was a similar presentation to a previous episode of hepatic encephalopathy. There was concern that the patient had not been complaint on his lactulose.   ED Course: His ammonia level was 203. CXR show mild pulm edema. He was pancytopenic; but no evidence of bleed. He was given lactulose and lasix. [facility] was called for admission.   Review of Systems:  Unable to obtain d/t mentation.   PMHx Past Medical History:  Diagnosis Date   Arthritis    "joints tighten up sometimes" (03/27/2104)   Carcinoma of liver, hepatocellular ([place]) 06/30/2020   Chronic lower back pain    Coronary artery disease involving native coronary artery of native heart without angina pectoris    Severe left main disease at catheterization July 2015  CABG x3 with a LIMA to the LAD, SVG to the OM, SVG to the PDA on 03/31/14. EF 60% by cath.    Diastolic heart failure (HCC)    GERD without esophagitis 08/04/2010   Hepatic cirrhosis ([place])    a. Dx 01/2014 - CT a/p    History of blood transfusion    "related to bleeding ulcers"   History of concussion    1976--  NO RESIDUAL   History of GI bleed    a. UGIB 07/2012;  b. 01/2014 admission with GIB/FOB stool req 1U prbc's->EGD showed portal gastropathy, barrett's esoph, and chronic active h. pylori gastritis.   History of gout    2007 &  2008  LEFT LEG-- NO ISSUE SINCE   Hyperlipidemia    Iron deficiency anemia    Kidney stones    OA  (osteoarthritis of spine)    LOWER BACK--  INTERMITTANT LEFT LEG NUMBNESS   OSA (obstructive sleep apnea)    PULMOLOGIST-  DR CLANCE--  MODERATE OSA  STARTED CPAP 2012--  BUT CURRENTLY HAS NOT USED PAST 6 MONTHS   Phimosis    a. s/p circumcision 2015.   Type 2 diabetes mellitus ([place])    Unspecified essential hypertension     PSHx Past Surgical History:  Procedure Laterality Date   ANKLE FRACTURE SURGERY Right 1989   "plate put in"   APPENDECTOMY  05-16-2004   open   BIOPSY  01/08/2021   Procedure: BIOPSY;  Surgeon: Wilford Corner, MD;  Location: [facility];  Service: Endoscopy;;   BIOPSY  05/02/2021   Procedure: BIOPSY;  Surgeon: Arta Silence, MD;  Location: [facility];  Service: Endoscopy;;   CIRCUMCISION N/A 09/09/2013   Procedure: CIRCUMCISION ADULT;  Surgeon: Bernestine Amass, MD;  Location: [facility];  Service: Urology;  Laterality: N/A;   COLECTOMY  05-16-2004   COLONOSCOPY N/A 05/02/2021   Procedure: COLONOSCOPY;  Surgeon: Arta Silence, MD;  Location: [facility];  Service: Endoscopy;  Laterality: N/A;   COLONOSCOPY WITH PROPOFOL N/A 11/20/2020   Procedure: COLONOSCOPY WITH PROPOFOL;  Surgeon: Ronald Lobo, MD;  Location: [facility];  Service: Endoscopy;  Laterality: N/A;   CORONARY ANGIOPLASTY WITH STENT PLACEMENT  06/28/2012  DR COOPER   PCI W/  X1 DES to [place]. LAD/  LM  40% OSTIAL & 50-60% DISTAL /  50% PROX LCX/  30-40% PROX RCA & 50% MID RCA/   LVEF 65-70%   CORONARY ARTERY BYPASS GRAFT N/A 03/31/2014   Procedure: CORONARY ARTERY BYPASS GRAFTING (CABG) times 3 using left internal mammary artery and right saphenous vein.;  Surgeon: Melrose Nakayama, MD;  Location: [place];  Service: Open Heart Surgery;  Laterality: N/A;   DOBUTAMINE STRESS ECHO  06-08-2012   MODERATE HYPOKINESIS/ ISCHEMIA MID INFERIOR WALL   ESOPHAGOGASTRODUODENOSCOPY  08/15/2012   Procedure: ESOPHAGOGASTRODUODENOSCOPY (EGD);  Surgeon: Wonda Horner, MD;  Location: [facility]  ENDOSCOPY;  Service: Endoscopy;  Laterality: N/A;   ESOPHAGOGASTRODUODENOSCOPY N/A 02/17/2014   Procedure: ESOPHAGOGASTRODUODENOSCOPY (EGD);  Surgeon: Jeryl Columbia, MD;  [facility]COPY;  Service: Endoscopy;  Laterality: N/A;   ESOPHAGOGASTRODUODENOSCOPY N/A 04/17/2020   Procedure: ESOPHAGOGASTRODUODENOSCOPY (EGD);  Surgeon: Ronnette Juniper, MD;  Location: [place];  Service: Gastroenterology;  Laterality: N/A;   ESOPHAGOGASTRODUODENOSCOPY N/A 09/11/2020   Procedure: ESOPHAGOGASTRODUODENOSCOPY (EGD);  Surgeon: Clarene Essex, MD;  Location: [facility];  Service: Endoscopy;  Laterality: N/A;   ESOPHAGOGASTRODUODENOSCOPY  10/09/2020   ESOPHAGOGASTRODUODENOSCOPY N/A 10/09/2020   Procedure: ESOPHAGOGASTRODUODENOSCOPY (EGD);  Surgeon: Otis Brace, MD;  Location: [facility] ENDOSCOPY;  Service: Gastroenterology;  Laterality: N/A;   ESOPHAGOGASTRODUODENOSCOPY N/A 01/08/2021   Procedure: ESOPHAGOGASTRODUODENOSCOPY (EGD);  Surgeon: Wilford Corner, MD;  Location: [facility];  Service: Endoscopy;  Laterality: N/A;   ESOPHAGOGASTRODUODENOSCOPY N/A 05/02/2021   Procedure: ESOPHAGOGASTRODUODENOSCOPY (EGD);  Surgeon: Arta Silence, MD;  Location: [facility];  Service: Endoscopy;  Laterality: N/A;   ESOPHAGOGASTRODUODENOSCOPY (EGD) WITH PROPOFOL N/A 09/30/2017   Procedure: ESOPHAGOGASTRODUODENOSCOPY (EGD) WITH PROPOFOL;  Surgeon: Wonda Horner, MD;  Location: [facility] ENDOSCOPY;  Service: Endoscopy;  Laterality: N/A;   ESOPHAGOGASTRODUODENOSCOPY (EGD) WITH PROPOFOL N/A 12/20/2017   Procedure: ESOPHAGOGASTRODUODENOSCOPY (EGD) WITH PROPOFOL;  Surgeon: Clarene Essex, MD;  Location: [place];  Service: Endoscopy;  Laterality: N/A;   ESOPHAGOGASTRODUODENOSCOPY (EGD) WITH PROPOFOL N/A 08/10/2020   Procedure: ESOPHAGOGASTRODUODENOSCOPY (EGD) WITH PROPOFOL;  Surgeon: Wilford Corner, MD;  Location: [place];  Service: Endoscopy;  Laterality: N/A;   ESOPHAGOGASTRODUODENOSCOPY (EGD) WITH PROPOFOL N/A 11/20/2020    Procedure: ESOPHAGOGASTRODUODENOSCOPY (EGD) WITH PROPOFOL;  Surgeon: Ronald Lobo, MD;  [facility];  Service: Endoscopy;  Laterality: N/A;   ESOPHAGOGASTRODUODENOSCOPY (EGD) WITH PROPOFOL N/A 02/28/2021   Procedure: ESOPHAGOGASTRODUODENOSCOPY (EGD) WITH PROPOFOL;  Surgeon: Clarene Essex, MD;  [facility];  Service: Endoscopy;  Laterality: N/A;   GIVENS CAPSULE STUDY N/A 11/20/2020   Procedure: GIVENS CAPSULE STUDY;  Surgeon: Ronald Lobo, MD;  Location: [facility];  Service: Endoscopy;  Laterality: N/A;   HEMOSTASIS CLIP PLACEMENT  05/02/2021   Procedure: HEMOSTASIS CLIP PLACEMENT;  Surgeon: Arta Silence, MD;  [facility];  Service: Endoscopy;;   HERNIA REPAIR     INTRAOPERATIVE TRANSESOPHAGEAL ECHOCARDIOGRAM N/A 03/31/2014   Procedure: INTRAOPERATIVE TRANSESOPHAGEAL ECHOCARDIOGRAM;  Surgeon: Melrose Nakayama, MD;  Location: [place];  Service: Open Heart Surgery;  Laterality: N/A;   IR ANGIOGRAM SELECTIVE EACH ADDITIONAL VESSEL  06/16/2020   IR ANGIOGRAM SELECTIVE EACH ADDITIONAL VESSEL  06/16/2020   IR ANGIOGRAM SELECTIVE EACH ADDITIONAL VESSEL  06/16/2020   IR ANGIOGRAM SELECTIVE EACH ADDITIONAL VESSEL  06/16/2020   IR ANGIOGRAM SELECTIVE EACH ADDITIONAL VESSEL  06/16/2020   IR ANGIOGRAM SELECTIVE EACH ADDITIONAL VESSEL  06/16/2020   IR ANGIOGRAM SELECTIVE EACH ADDITIONAL VESSEL  06/16/2020   IR ANGIOGRAM SELECTIVE EACH ADDITIONAL VESSEL  06/16/2020   IR ANGIOGRAM SELECTIVE EACH ADDITIONAL VESSEL  06/16/2020   IR ANGIOGRAM SELECTIVE EACH ADDITIONAL VESSEL  06/30/2020   IR ANGIOGRAM SELECTIVE EACH ADDITIONAL VESSEL  06/30/2020   IR ANGIOGRAM VISCERAL SELECTIVE  06/16/2020   IR ANGIOGRAM VISCERAL SELECTIVE  06/30/2020   IR EMBO ARTERIAL NOT HEMORR HEMANG INC GUIDE ROADMAPPING  06/16/2020   IR EMBO TUMOR ORGAN ISCHEMIA INFARCT INC GUIDE ROADMAPPING  06/30/2020   IR EMBO VENOUS NOT HEMORR HEMANG  INC GUIDE ROADMAPPING  01/13/2021   IR IVUS EACH ADDITIONAL NON  CORONARY VESSEL  01/13/2021   IR RADIOLOGIST EVAL & MGMT  05/06/2020   IR RADIOLOGIST EVAL & MGMT  05/20/2020   IR RADIOLOGIST EVAL & MGMT  05/25/2021   IR TIPS  01/13/2021   IR US GUIDE VASC ACCESS RIGHT  06/16/2020   IR US GUIDE VASC ACCESS RIGHT  06/30/2020   IR US GUIDE VASC ACCESS RIGHT  01/13/2021   LAPAROSCOPIC UMBILICAL HERNIA REPAIR W/ MESH  06-06-2011   LEFT HEART CATHETERIZATION WITH CORONARY ANGIOGRAM N/A 03/28/2014   Procedure: LEFT HEART CATHETERIZATION WITH CORONARY ANGIOGRAM;  Surgeon: Sinclair Grooms, MD;  Location: [facility] CATH LAB;  Service: Cardiovascular;  Laterality: N/A;   LIPOMA EXCISION Left 08/25/2017   Procedure: EXCISION LIPOMA LEFT POSTERIOR THIGH;  Surgeon: Clovis Riley, MD;  Location: [place];  Service: General;  Laterality: Left;   NEPHROLITHOTOMY  1990'S   OPEN APPENDECTOMY W/ PARTIAL CECECTOMY  05-16-2004   PERCUTANEOUS CORONARY STENT INTERVENTION (PCI-S) N/A 06/28/2012   Procedure: PERCUTANEOUS CORONARY STENT INTERVENTION (PCI-S);  Surgeon: Sherren Mocha, MD;  Location: [facility] CATH LAB;  Service: Cardiovascular;  Laterality: N/A;   POLYPECTOMY  11/20/2020   Procedure: POLYPECTOMY;  Surgeon: Ronald Lobo, MD;  [facility]: [facility];  Service: Endoscopy;;   POLYPECTOMY  05/02/2021   Procedure: POLYPECTOMY;  Surgeon: Arta Silence, MD;  Location: [facility];  Service: Endoscopy;;   RADIOLOGY WITH ANESTHESIA N/A 01/13/2021   Procedure: IR WITH ANESTHESIA - TIPS;  Surgeon: Suzette Battiest, MD;  Location: [place];  Service: Radiology;  Laterality: N/A;    SocHx  reports that he quit smoking about 24 years ago. His smoking use included cigarettes. He has a 80.00 pack-year smoking history. He has never used smokeless tobacco. He reports that he does not currently use alcohol after a past usage of about 8.0 standard drinks per week. He reports that he does not use drugs.  Allergies  Allergen Reactions   Fluzone Quadrivalent [Influenza Vac Split Quad] Other (See Comments)     The patient stated, in 10/2020: "I am not taking any more flu shots. It liked to have killed me."    FamHx Family History  Problem Relation Age of Onset   Lung cancer Sister    Cancer Sister        lung   Cancer Mother    Cancer Father        died in his 1s.   Cancer Brother        lung   Coronary artery disease Other    Diabetes Other    Colon cancer Other    Cancer Sister        lung    Prior to Admission medications   Medication Sig Start Date End Date Taking? Authorizing Provider  Accu-Chek Softclix Lancets lancets USE AS DIRECTED TO TEST BLOOD SUGAR FOUR TIMES DAILY 03/13/20   [name], MD  albuterol (VENTOLIN HFA) 108 (90 Base) MCG/ACT inhaler Inhale  2 puffs into the lungs every 6 (six) hours as needed for wheezing or shortness of breath. 03/01/21   Aline August, MD  blood glucose meter kit and supplies Dispense based on patient and insurance preference. Use up to four times daily as directed. (FOR ICD-10 E10.9, E11.9). 03/12/20   [name], MD  ferrous sulfate 325 (65 FE) MG tablet Take 1 tablet (325 mg total) by mouth daily with breakfast. 05/06/21 08/04/21  Dahal, Marlowe Aschoff, MD  furosemide (LASIX) 40 MG tablet Take 1 tablet (40 mg total) by mouth daily. 05/05/21 08/03/21  [name], MD  lactulose (CHRONULAC) 10 GM/15ML solution Take 45 mLs (30 g total) by mouth 3 (three) times daily. 03/31/21   Debbe Odea, MD  nitroGLYCERIN (NITROSTAT) 0.4 MG SL tablet Place 1 tablet (0.4 mg total) under the tongue every 5 (five) minutes as needed for chest pain. 06/08/16   Nahser, Wonda Cheng, MD  pantoprazole (PROTONIX) 40 MG tablet Take 1 tablet (40 mg total) by mouth daily. 03/31/21   Debbe Odea, MD  spironolactone (ALDACTONE) 50 MG tablet Take 1 tablet (50 mg total) by mouth daily. 03/07/21   Jonetta Osgood, MD    Physical Exam: Vitals:   05/28/21 0600 05/28/21 0630 05/28/21 0645 05/28/21 0720  BP: (!) 154/72 (!) 144/59 (!) 146/58   Pulse: 98 98 93 83   Resp: _0 Temp:      TempSrc:      SpO2: 98% 96% 96% 99%  Weight:      Height:        General: 75 y.o. male resting in bed in NAD Eyes: PERRL, normal sclera ENMT: Nares patent w/o discharge, orophaynx clear, dentition normal, ears w/o discharge/lesions/ulcers Neck: Supple, trachea midline Cardiovascular: RRR, +S1, S2, no m/g/r, equal pulses throughout Respiratory: CTABL, no w/r/r, normal WOB GI: BS+, NDNT, no masses noted, no organomegaly noted MSK: No e/c/c Skin: No rashes, bruises, ulcerations noted Neuro: somnolent; stirs to noxious stimuli, but not following direction  Labs on Admission: I have personally reviewed following labs and imaging studies  CBC: Recent Labs  Lab 05/28/21 0125  WBC 3.0*  NEUTROABS 2.4  HGB 7.1*  HCT 24.1*  MCV 87.3  PLT 254*   Basic Metabolic Panel: Recent Labs  Lab 05/28/21 0125  NA 139  K 4.5  CL 111  CO2 21*  GLUCOSE 137*  BUN 23  CREATININE 1.13  CALCIUM 8.8*   GFR: Estimated Creatinine Clearance: 65.8 mL/min (by C-G formula based on SCr of 1.13 mg/dL). Liver Function Tests: Recent Labs  Lab 05/28/21 0125  AST 34  ALT 24  ALKPHOS 163*  BILITOT 0.8  PROT 6.9  ALBUMIN 3.3*   No results for input(s): LIPASE, AMYLASE in the last 168 hours. Recent Labs  Lab 05/28/21 0125  AMMONIA 203*   Coagulation Profile: No results for input(s): INR, PROTIME in the last 168 hours. Cardiac Enzymes: No results for input(s): CKTOTAL, CKMB, CKMBINDEX, TROPONINI in the last 168 hours. BNP (last 3 results) No results for input(s): PROBNP in the last 8760 hours. HbA1C: No results for input(s): HGBA1C in the last 72 hours. CBG: Recent Labs  Lab 05/28/21 0127  GLUCAP 132*   Lipid Profile: No results for input(s): CHOL, HDL, LDLCALC, TRIG, CHOLHDL, LDLDIRECT in the last 72 hours. Thyroid Function Tests: No results for input(s): TSH, T4TOTAL, FREET4, T3FREE, THYROIDAB in the last 72 hours. Anemia Panel: No results for  input(s): VITAMINB12, FOLATE, FERRITIN, TIBC, IRON, RETICCTPCT in the  last 72 hours. Urine analysis:    Component Value Date/Time   COLORURINE YELLOW 05/28/2021 0632   APPEARANCEUR CLEAR 05/28/2021 0632   LABSPEC 1.009 05/28/2021 0632   PHURINE 7.0 05/28/2021 0632   GLUCOSEU NEGATIVE 05/28/2021 0632   HGBUR SMALL (A) 05/28/2021 0632   BILIRUBINUR NEGATIVE 05/28/2021 0632   KETONESUR NEGATIVE 05/28/2021 0632   PROTEINUR NEGATIVE 05/28/2021 0632   UROBILINOGEN 0.2 03/29/2014 1546   NITRITE NEGATIVE 05/28/2021 0632   LEUKOCYTESUR TRACE (A) 05/28/2021 7673    Radiological Exams on Admission: CT HEAD WO CONTRAST (5MM)  Result Date: 05/28/2021 CLINICAL DATA:  Altered mental status, hepatic failure EXAM: CT HEAD WITHOUT CONTRAST TECHNIQUE: Contiguous axial images were obtained from the base of the skull through the vertex without intravenous contrast. COMPARISON:  01/22/2021 FINDINGS: Brain: Normal anatomic configuration. Parenchymal volume loss is commensurate with the patient's age. Moderate subcortical and periventricular white matter changes are present likely reflecting the sequela of small vessel ischemia. No abnormal intra or extra-axial mass lesion or fluid collection. No abnormal mass effect or midline shift. No evidence of acute intracranial hemorrhage or cortical infarct. Ventricular size is normal. Cerebellum unremarkable. Vascular: No asymmetric hyperdense vasculature at the skull base. Skull: Intact Sinuses/Orbits: Paranasal sinuses are clear. Orbits are unremarkable. Other: Mastoid air cells and middle ear cavities are clear. IMPRESSION: No acute intracranial abnormality. Moderate white matter change, likely stable since prior examination when accounting for technical differences and likely the sequela of small vessel ischemia. Electronically Signed   By: Fidela Salisbury M.D.   On: 05/28/2021 04:18   DG Chest Portable 1 View  Result Date: 05/28/2021 CLINICAL DATA:  Altered mental  status EXAM: PORTABLE CHEST 1 VIEW COMPARISON:  04/29/2021 FINDINGS: The lungs are symmetrically expanded. No pneumothorax or pleural effusion. Coronary artery bypass grafting has been performed. Cardiac size within normal limits. Mild bilateral perihilar and lower lung zone interstitial pulmonary infiltrate is present, progressive since prior examination, most in keeping with mild cardiogenic failure. No acute bone abnormality. IMPRESSION: Interval development of trace interstitial pulmonary edema, most in keeping with developing mild cardiogenic failure. Electronically Signed   By: Fidela Salisbury M.D.   On: 05/28/2021 02:09    Assessment/Plan Hepatic encephalopathy Cirrhosis of liver Hx of hepatocellular carcinoma     - admit to inpt, tele     - rectal lactulose given; continue BID until he is taking PO     - no signs of infection     - give lasix IV for now; resume spironolactone and xifaxan when taking PO  DM2     - SSI, glucose checks, A1c; start DM diet when taking PO  Pancytopenia     - no evidence of bleed     - secondary to liver disease     - His Hgb is 7.1. Chart review shows his baseline is 7 - 8; watch for now; transfuse if he goes below 7  Chronic diastolic HF     - he has the appearance of chronic volume overload; his BNP is mildly elevated and CXR shows some pulm edema; respiratory status is ok on RA     - continue IV lasix until he is able to take PO     - daily wts  DVT prophylaxis: SCDs  Code Status: FULL  Family Communication: None at bedside; unable to reach spouse by phone  Consults called: None   Status is: Inpatient  Remains inpatient appropriate because:Inpatient level of care appropriate due to severity of illness  Dispo: The patient is from: Home              Anticipated d/c is to: Home              Patient currently is not medically stable to d/c.   Difficult to place patient No  Time spent coordinating admission: 60 minutes  [place]  [facility]  If 7PM-7AM, please contact night-coverage [email or web address]  05/28/2021, 7:38 AM

## 2021-05-29 DIAGNOSIS — K7682 Hepatic encephalopathy: Secondary | ICD-10-CM

## 2021-05-29 DIAGNOSIS — K7030 Alcoholic cirrhosis of liver without ascites: Secondary | ICD-10-CM

## 2021-05-29 LAB — COMPREHENSIVE METABOLIC PANEL
ALT: 21 U/L (ref 0–44)
AST: 34 U/L (ref 15–41)
Albumin: 2.7 g/dL — ABNORMAL LOW (ref 3.5–5.0)
Alkaline Phosphatase: 125 U/L (ref 38–126)
Anion gap: 8 (ref 5–15)
BUN: 21 mg/dL (ref 8–23)
CO2: 21 mmol/L — ABNORMAL LOW (ref 22–32)
Calcium: 8.8 mg/dL — ABNORMAL LOW (ref 8.9–10.3)
Chloride: 113 mmol/L — ABNORMAL HIGH (ref 98–111)
Creatinine, Ser: 1.01 mg/dL (ref 0.61–1.24)
GFR, Estimated: >60 mL/min (ref >60)
Glucose, Bld: 112 mg/dL — ABNORMAL HIGH (ref 70–99)
Potassium: 3.6 mmol/L (ref 3.5–5.1)
Sodium: 142 mmol/L (ref 135–145)
Total Bilirubin: 1.2 mg/dL (ref 0.3–1.2)
Total Protein: 5.9 g/dL — ABNORMAL LOW (ref 6.5–8.1)

## 2021-05-29 LAB — CBC
HCT: 22.6 % — ABNORMAL LOW (ref 39.0–52.0)
Hemoglobin: 6.7 g/dL — CL (ref 13.0–17.0)
MCH: 25.5 pg — ABNORMAL LOW (ref 26.0–34.0)
MCHC: 29.6 g/dL — ABNORMAL LOW (ref 30.0–36.0)
MCV: 85.9 fL (ref 80.0–100.0)
Platelets: 115 10*3/uL — ABNORMAL LOW (ref 150–400)
RBC: 2.63 MIL/uL — ABNORMAL LOW (ref 4.22–5.81)
RDW: 23.9 % — ABNORMAL HIGH (ref 11.5–15.5)
WBC: 3.1 10*3/uL — ABNORMAL LOW (ref 4.0–10.5)
nRBC: 0 % (ref 0.0–0.2)

## 2021-05-29 LAB — HEMOGLOBIN A1C
Hgb A1c MFr Bld: 4.8 % (ref 4.8–5.6)
Mean Plasma Glucose: 91.06 mg/dL

## 2021-05-29 LAB — GLUCOSE, CAPILLARY
Glucose-Capillary: 109 mg/dL — ABNORMAL HIGH (ref 70–99)
Glucose-Capillary: 109 mg/dL — ABNORMAL HIGH (ref 70–99)
Glucose-Capillary: 140 mg/dL — ABNORMAL HIGH (ref 70–99)
Glucose-Capillary: 130 mg/dL — ABNORMAL HIGH (ref 70–99)

## 2021-05-29 LAB — PREPARE RBC (CROSSMATCH)

## 2021-05-29 LAB — HEMOGLOBIN AND HEMATOCRIT, BLOOD
HCT: 27 % — ABNORMAL LOW (ref 39.0–52.0)
Hemoglobin: 8.1 g/dL — ABNORMAL LOW (ref 13.0–17.0)

## 2021-05-29 LAB — PROTIME-INR
INR: 1.3 — ABNORMAL HIGH (ref 0.8–1.2)
Prothrombin Time: 16.4 seconds — ABNORMAL HIGH (ref 11.4–15.2)

## 2021-05-29 MED ORDER — SPIRONOLACTONE 25 MG PO TABS
50.0000 mg | ORAL_TABLET | Freq: Every day | ORAL | Status: DC
  Administered 2021-05-29: 50 mg via ORAL
  Filled 2021-05-29: qty 2

## 2021-05-29 MED ORDER — FERROUS SULFATE 325 (65 FE) MG PO TABS
325.0000 mg | ORAL_TABLET | Freq: Every day | ORAL | Status: DC
  Administered 2021-05-30 – 2021-06-03 (×5): 325 mg via ORAL
  Filled 2021-05-29 (×5): qty 1

## 2021-05-29 MED ORDER — LACTULOSE 10 GM/15ML PO SOLN
30.0000 g | Freq: Three times a day (TID) | ORAL | Status: DC
  Administered 2021-05-29 – 2021-06-01 (×10): 30 g via ORAL
  Filled 2021-05-29 (×10): qty 45

## 2021-05-29 MED ORDER — SODIUM CHLORIDE 0.9% IV SOLUTION: Freq: Once | INTRAVENOUS | Status: AC

## 2021-05-29 MED ORDER — ALBUTEROL SULFATE (2.5 MG/3ML) 0.083% IN NEBU: 3.0000 mL | INHALATION_SOLUTION | Freq: Four times a day (QID) | RESPIRATORY_TRACT | Status: DC | PRN

## 2021-05-29 MED ORDER — PANTOPRAZOLE SODIUM 40 MG PO TBEC
40.0000 mg | DELAYED_RELEASE_TABLET | Freq: Every day | ORAL | Status: DC
  Administered 2021-05-29 – 2021-06-03 (×6): 40 mg via ORAL
  Filled 2021-05-29 (×6): qty 1

## 2021-05-29 MED ORDER — FUROSEMIDE 40 MG PO TABS
40.0000 mg | ORAL_TABLET | Freq: Every day | ORAL | Status: DC
  Administered 2021-05-30 – 2021-06-03 (×5): 40 mg via ORAL
  Filled 2021-05-29 (×5): qty 1

## 2021-05-29 NOTE — Progress Notes (Signed)
HgB 6.7 this a.m. Dr. Sabino Gasser made aware.

## 2021-05-29 NOTE — Assessment & Plan Note (Signed)
-   qhs CPAP

## 2021-05-29 NOTE — Hospital Course (Signed)
[name] is a 75 year old male with PMH cirrhosis, HCC, diastolic CHF, HLD, OA, OSA, DM II, HTN, GERD, arthritis, gout who presented with altered mental status.  Patient was unable to provide collateral information on admission due to severe lethargy/obtundation.  He had similar presentation in the past when he was noncompliant with his lactulose. On admission his ammonia level was found to be elevated, 203 and he was started on lactulose enema.  Mentation slowly improved and he was able to be transitioned back to his home medications.

## 2021-05-29 NOTE — Assessment & Plan Note (Signed)
-   continue oral iron

## 2021-05-29 NOTE — Progress Notes (Signed)
  Progress Note    [name]   VQX:[id number]  DOB: 04-19-1946  DOA: 05/28/2021     1 Date of Service: 05/29/2021   [name] is a 75 year old male with PMH cirrhosis, HCC, diastolic CHF, HLD, OA, OSA, DM II, HTN, GERD, arthritis, gout who presented with altered mental status.  Patient was unable to provide collateral information on admission due to severe lethargy/obtundation.  He had similar presentation in the past when he was noncompliant with his lactulose. On admission his ammonia level was found to be elevated, 203 and he was started on lactulose enema.  Mentation slowly improved and he was able to be transitioned back to his home medications.  Subjective:  Patient awake and alert this morning.  Still somewhat drowsy but able to answer most of my questions.  States that his son is in charge of his medications therefore he cannot list all of them to me when asked.  Hospital Problems * Hepatic encephalopathy - Patient still poor historian but is awake and alert this morning.  Ammonia level on admission 203 - States that his son arranges his medications for him - Change lactulose enema to home dosing orally - Repeat ammonia in am  Hepatic cirrhosis (HCC) - resume lasix and spironolactone - renal function stable; does have 2-3+ LE edema and spirono/lasix ratio not ideal; as long as renal fxn stable tomorrow after resuming meds, will likely adjust spirono dosing  Iron deficiency anemia - continue oral iron   Obesity (BMI 00.3-49.1) - Complicates overall prognosis and care - Body mass index is 32.72 kg/m.  OSA (obstructive sleep apnea) - qhs CPAP  GERD without esophagitis - continue protonix   Type 2 diabetes with complication (HCC) - Metformin listed on med rec but unclear if patient is still taking although A1c is 4.8% and likely can continue diet control only     Objective Vital signs were reviewed and unremarkable.  Vitals:   05/29/21 0040 05/29/21 0436 05/29/21  1118 05/29/21 1133  BP: (!) 116/41 (!) 111/44 (!) 120/49 (!) 130/55  Pulse: 77 74 71 78  Resp: 20 20  20   Temp: 97.9 F (36.6 C) 97.9 F (36.6 C) 98.3 F (36.8 C) 98 F (36.7 C)  TempSrc: Oral Oral Oral Oral  SpO2:  98% 99% 100%  Weight:  97.6 kg    Height:       97.6 kg  Exam General appearance: alert, cooperative, no distress, and still lethargic Head: Normocephalic, without obvious abnormality, atraumatic Eyes:  EOMI Lungs: clear to auscultation bilaterally Heart: regular rate and rhythm and S1, S2 normal Abdomen: normal findings: bowel sounds normal and soft, non-tender Extremities:  2-3+ bilateral lower extremity edema Skin:  Chronic stasis changes in lower extremities due to chronic edema Neurologic: Grossly normal no asterixis   Labs / Other Information My review of labs, imaging, notes and other tests is significant for Ammonia 203 on admission    Time spent: Greater than 50% of the 35 minute visit was spent in counseling/coordination of care for the patient as laid out in the A&P.  Dwyane [name], MD [facility] 05/29/2021, 12:04 PM

## 2021-05-29 NOTE — Progress Notes (Signed)
Pt refusing CPAP QHS.  Pt states he hasn't worn it in 2 or 3 years.  Pt to notify RT if he decides to wear.

## 2021-05-29 NOTE — Assessment & Plan Note (Signed)
--   continue protonix  ?

## 2021-05-29 NOTE — Assessment & Plan Note (Addendum)
-   resume lasix and spironolactone - renal function stable; does have 2-3+ LE edema and spirono/lasix ratio not ideal - Given stable renal function this morning and no hyperK, increasing spironolactone to 100 mg daily and continuing Lasix 40 mg daily

## 2021-05-29 NOTE — Assessment & Plan Note (Addendum)
-   Patient still poor historian but is awake and alert this morning.  Ammonia level on admission 203 - States that his son arranges his medications for him - Change lactulose enema to home dosing orally - Repeat ammonia is improved today (104) and mentation continues to clear - PT consulted due to deconditioning

## 2021-05-29 NOTE — Assessment & Plan Note (Signed)
-   Metformin listed on med rec but unclear if patient is still taking although A1c is 4.8% and likely can continue diet control only

## 2021-05-29 NOTE — Assessment & Plan Note (Signed)
-   Complicates overall prognosis and care - Body mass index is 32.72 kg/m.

## 2021-05-30 DIAGNOSIS — K7682 Hepatic encephalopathy: Secondary | ICD-10-CM | POA: Diagnosis not present

## 2021-05-30 DIAGNOSIS — K7030 Alcoholic cirrhosis of liver without ascites: Secondary | ICD-10-CM | POA: Diagnosis not present

## 2021-05-30 LAB — CBC WITH DIFFERENTIAL/PLATELET
Abs Immature Granulocytes: 0.01 10*3/uL (ref 0.00–0.07)
Basophils Absolute: 0 10*3/uL (ref 0.0–0.1)
Basophils Relative: 1 %
Eosinophils Absolute: 0.1 10*3/uL (ref 0.0–0.5)
Eosinophils Relative: 3 %
HCT: 24.5 % — ABNORMAL LOW (ref 39.0–52.0)
Hemoglobin: 7.4 g/dL — ABNORMAL LOW (ref 13.0–17.0)
Immature Granulocytes: 0 %
Lymphocytes Relative: 16 %
Lymphs Abs: 0.5 10*3/uL — ABNORMAL LOW (ref 0.7–4.0)
MCH: 25.8 pg — ABNORMAL LOW (ref 26.0–34.0)
MCHC: 30.2 g/dL (ref 30.0–36.0)
MCV: 85.4 fL (ref 80.0–100.0)
Monocytes Absolute: 0.4 10*3/uL (ref 0.1–1.0)
Monocytes Relative: 12 %
Neutro Abs: 2.2 10*3/uL (ref 1.7–7.7)
Neutrophils Relative %: 68 %
Platelets: 91 10*3/uL — ABNORMAL LOW (ref 150–400)
RBC: 2.87 MIL/uL — ABNORMAL LOW (ref 4.22–5.81)
RDW: 22.1 % — ABNORMAL HIGH (ref 11.5–15.5)
WBC: 3.1 10*3/uL — ABNORMAL LOW (ref 4.0–10.5)
nRBC: 0 % (ref 0.0–0.2)

## 2021-05-30 LAB — COMPREHENSIVE METABOLIC PANEL
ALT: 25 U/L (ref 0–44)
AST: 37 U/L (ref 15–41)
Albumin: 2.7 g/dL — ABNORMAL LOW (ref 3.5–5.0)
Alkaline Phosphatase: 142 U/L — ABNORMAL HIGH (ref 38–126)
Anion gap: 6 (ref 5–15)
BUN: 19 mg/dL (ref 8–23)
CO2: 22 mmol/L (ref 22–32)
Calcium: 8.5 mg/dL — ABNORMAL LOW (ref 8.9–10.3)
Chloride: 110 mmol/L (ref 98–111)
Creatinine, Ser: 0.79 mg/dL (ref 0.61–1.24)
GFR, Estimated: >60 mL/min (ref >60)
Glucose, Bld: 125 mg/dL — ABNORMAL HIGH (ref 70–99)
Potassium: 3.6 mmol/L (ref 3.5–5.1)
Sodium: 138 mmol/L (ref 135–145)
Total Bilirubin: 1.1 mg/dL (ref 0.3–1.2)
Total Protein: 5.8 g/dL — ABNORMAL LOW (ref 6.5–8.1)

## 2021-05-30 LAB — AMMONIA: Ammonia: 104 umol/L — ABNORMAL HIGH (ref 9–35)

## 2021-05-30 LAB — GLUCOSE, CAPILLARY
Glucose-Capillary: 108 mg/dL — ABNORMAL HIGH (ref 70–99)
Glucose-Capillary: 137 mg/dL — ABNORMAL HIGH (ref 70–99)
Glucose-Capillary: 209 mg/dL — ABNORMAL HIGH (ref 70–99)
Glucose-Capillary: 144 mg/dL — ABNORMAL HIGH (ref 70–99)

## 2021-05-30 LAB — MAGNESIUM: Magnesium: 1.9 mg/dL (ref 1.7–2.4)

## 2021-05-30 MED ORDER — SPIRONOLACTONE 100 MG PO TABS
100.0000 mg | ORAL_TABLET | Freq: Every day | ORAL | Status: DC
  Administered 2021-05-30 – 2021-06-03 (×5): 100 mg via ORAL
  Filled 2021-05-30 (×5): qty 1

## 2021-05-30 NOTE — Progress Notes (Signed)
Pt refuses CPAP QHS. 

## 2021-05-30 NOTE — Progress Notes (Signed)
  Progress Note    SHENOUDA GENOVA   PRF:[id number]  DOB: 1946/03/09  DOA: 05/28/2021     2 Date of Service: 05/30/2021   [name] is a 75 year old male with PMH cirrhosis, HCC, diastolic CHF, HLD, OA, OSA, DM II, HTN, GERD, arthritis, gout who presented with altered mental status.  Patient was unable to provide collateral information on admission due to severe lethargy/obtundation.  He had similar presentation in the past when he was noncompliant with his lactulose. On admission his ammonia level was found to be elevated, 203 and he was started on lactulose enema.  Mentation slowly improved and he was able to be transitioned back to his home medications.  Subjective:  No events overnight.  Mentation continues to improve and he is more awake and alert.  Tolerating diet well.  Remains deconditioned and informed him we can have physical therapy evaluate him.  Hospital Problems * Hepatic encephalopathy - Patient still poor historian but is awake and alert this morning.  Ammonia level on admission 203 - States that his son arranges his medications for him - Change lactulose enema to home dosing orally - Repeat ammonia is improved today (104) and mentation continues to clear - PT consulted due to deconditioning  Hepatic cirrhosis (HCC) - resume lasix and spironolactone - renal function stable; does have 2-3+ LE edema and spirono/lasix ratio not ideal - Given stable renal function this morning and no hyperK, increasing spironolactone to 100 mg daily and continuing Lasix 40 mg daily   Iron deficiency anemia - continue oral iron   Obesity (BMI 93.5-70.1) - Complicates overall prognosis and care - Body mass index is 32.72 kg/m.  OSA (obstructive sleep apnea) - qhs CPAP  GERD without esophagitis - continue protonix   Type 2 diabetes with complication (HCC) - Metformin listed on med rec but unclear if patient is still taking although A1c is 4.8% and likely can continue diet control  only    Objective Vital signs were reviewed and unremarkable.  Vitals:   05/29/21 1424 05/29/21 1931 05/30/21 0446 05/30/21 0448  BP:  (!) 116/52 (!) 123/59   Pulse:  66 70   Resp: 16 18 18    Temp:  98.1 F (36.7 C) 98 F (36.7 C)   TempSrc:      SpO2:  99% 99%   Weight:    98.3 kg  Height:       98.3 kg  Exam General appearance: alert, cooperative, no distress, and still lethargic Head: Normocephalic, without obvious abnormality, atraumatic Eyes:  EOMI Lungs: clear to auscultation bilaterally Heart: regular rate and rhythm and S1, S2 normal Abdomen: normal findings: bowel sounds normal and soft, non-tender Extremities:  2+ bilateral lower extremity edema  which has improved Skin:  Chronic stasis changes in lower extremities due to chronic edema Neurologic: Grossly normal no asterixis   Labs / Other Information My review of labs, imaging, notes and other tests is significant for Ammonia 203 on admission    Time spent: Greater than 50% of the 35 minute visit was spent in counseling/coordination of care for the patient as laid out in the A&P.  Dwyane [name], MD [facility] 05/30/2021, 12:37 PM

## 2021-05-31 DIAGNOSIS — K7682 Hepatic encephalopathy: Secondary | ICD-10-CM | POA: Diagnosis not present

## 2021-05-31 DIAGNOSIS — K219 Gastro-esophageal reflux disease without esophagitis: Secondary | ICD-10-CM

## 2021-05-31 DIAGNOSIS — K7030 Alcoholic cirrhosis of liver without ascites: Secondary | ICD-10-CM | POA: Diagnosis not present

## 2021-05-31 DIAGNOSIS — D500 Iron deficiency anemia secondary to blood loss (chronic): Secondary | ICD-10-CM

## 2021-05-31 DIAGNOSIS — G4733 Obstructive sleep apnea (adult) (pediatric): Secondary | ICD-10-CM

## 2021-05-31 DIAGNOSIS — E669 Obesity, unspecified: Secondary | ICD-10-CM

## 2021-05-31 LAB — COMPREHENSIVE METABOLIC PANEL
ALT: 25 U/L (ref 0–44)
AST: 36 U/L (ref 15–41)
Albumin: 2.7 g/dL — ABNORMAL LOW (ref 3.5–5.0)
Alkaline Phosphatase: 138 U/L — ABNORMAL HIGH (ref 38–126)
Anion gap: 4 — ABNORMAL LOW (ref 5–15)
BUN: 19 mg/dL (ref 8–23)
CO2: 23 mmol/L (ref 22–32)
Calcium: 8.2 mg/dL — ABNORMAL LOW (ref 8.9–10.3)
Chloride: 105 mmol/L (ref 98–111)
Creatinine, Ser: 1.07 mg/dL (ref 0.61–1.24)
GFR, Estimated: >60 mL/min (ref >60)
Glucose, Bld: 97 mg/dL (ref 70–99)
Potassium: 4.1 mmol/L (ref 3.5–5.1)
Sodium: 132 mmol/L — ABNORMAL LOW (ref 135–145)
Total Bilirubin: 1 mg/dL (ref 0.3–1.2)
Total Protein: 5.8 g/dL — ABNORMAL LOW (ref 6.5–8.1)

## 2021-05-31 LAB — CBC WITH DIFFERENTIAL/PLATELET
Abs Immature Granulocytes: 0 10*3/uL (ref 0.00–0.07)
Basophils Absolute: 0 10*3/uL (ref 0.0–0.1)
Basophils Relative: 0 %
Eosinophils Absolute: 0.1 10*3/uL (ref 0.0–0.5)
Eosinophils Relative: 3 %
HCT: 24.1 % — ABNORMAL LOW (ref 39.0–52.0)
Hemoglobin: 7.3 g/dL — ABNORMAL LOW (ref 13.0–17.0)
Immature Granulocytes: 0 %
Lymphocytes Relative: 14 %
Lymphs Abs: 0.4 10*3/uL — ABNORMAL LOW (ref 0.7–4.0)
MCH: 25.6 pg — ABNORMAL LOW (ref 26.0–34.0)
MCHC: 30.3 g/dL (ref 30.0–36.0)
MCV: 84.6 fL (ref 80.0–100.0)
Monocytes Absolute: 0.3 10*3/uL (ref 0.1–1.0)
Monocytes Relative: 9 %
Neutro Abs: 2.3 10*3/uL (ref 1.7–7.7)
Neutrophils Relative %: 74 %
Platelets: 91 10*3/uL — ABNORMAL LOW (ref 150–400)
RBC: 2.85 MIL/uL — ABNORMAL LOW (ref 4.22–5.81)
RDW: 21.5 % — ABNORMAL HIGH (ref 11.5–15.5)
WBC: 3 10*3/uL — ABNORMAL LOW (ref 4.0–10.5)
nRBC: 0 % (ref 0.0–0.2)

## 2021-05-31 LAB — GLUCOSE, CAPILLARY
Glucose-Capillary: 106 mg/dL — ABNORMAL HIGH (ref 70–99)
Glucose-Capillary: 156 mg/dL — ABNORMAL HIGH (ref 70–99)
Glucose-Capillary: 186 mg/dL — ABNORMAL HIGH (ref 70–99)
Glucose-Capillary: 137 mg/dL — ABNORMAL HIGH (ref 70–99)

## 2021-05-31 LAB — TYPE AND SCREEN
ABO/RH(D): O POS
Antibody Screen: NEGATIVE
Unit division: 0

## 2021-05-31 LAB — BPAM RBC
Blood Product Expiration Date: 202211022359
ISSUE DATE / TIME: 202210011107
Unit Type and Rh: 5100

## 2021-05-31 LAB — MAGNESIUM: Magnesium: 1.9 mg/dL (ref 1.7–2.4)

## 2021-05-31 NOTE — Evaluation (Signed)
Physical Therapy Evaluation Patient Details Name: [name] MRN: [id number] DOB: 1946-01-07 Today's Date: 05/31/2021  History of Present Illness  75 year old male adm with hepatic encephalopathy.  PMH cirrhosis, HCC, diastolic CHF, HLD, OA, OSA, DM II, HTN, GERD, arthritis, gout who presented with altered mental status.  Clinical Impression      Pt admitted with above diagnosis.  Pt currently with functional limitations due to the deficits listed below (see PT Problem List). Pt will benefit from skilled PT to increase their independence and safety with mobility to allow discharge to the venue listed below.  Pt agreeable to OOB however fatigues quickly, globally deconditioned.   Given these factors in addition to multiple recent admissions, recommend SNF at this time. Will continue to follow in acute setting     Recommendations for follow up therapy are one component of a multi-disciplinary discharge planning process, led by the attending physician.  Recommendations may be updated based on patient status, additional functional criteria and insurance authorization.  Follow Up Recommendations SNF    Equipment Recommendations  Rolling walker with 5" wheels    Recommendations for Other Services       Precautions / Restrictions Precautions Precautions: Fall Restrictions Weight Bearing Restrictions: No      Mobility  Bed Mobility Overal bed mobility: Needs Assistance Bed Mobility: Supine to Sit     Supine to sit: Min assist     General bed mobility comments: assist to bring LEs off bed, incr time needed    Transfers Overall transfer level: Needs assistance Equipment used: Rolling walker (2 wheeled) Transfers: Sit to/from [facility] Sit to Stand: Min assist Stand pivot transfers: Min assist       General transfer comment: assist to rise and transition to RW. cues for hand placement. incr time; assist to steady and maneuver RW for stand  pivot  Ambulation/Gait             General Gait Details: pt too fatigued  Stairs            Wheelchair Mobility    Modified Rankin (Stroke Patients Only)       Balance Overall balance assessment: Needs assistance Sitting-balance support: No upper extremity supported;Feet supported Sitting balance-Leahy Scale: Fair     Standing balance support: Bilateral upper extremity supported;During functional activity Standing balance-Leahy Scale: Poor Standing balance comment: reliant on UEs                             Pertinent Vitals/Pain Pain Assessment: No/denies pain    Home Living Family/patient expects to be discharged to:: Private residence Living Arrangements: Spouse/significant other;Children Available Help at Discharge: Family;Available 24 hours/day Type of Home: House Home Access: Stairs to enter   [employer] of Steps: 1 Home Layout: Two level;Able to live on main level with bedroom/bathroom Home Equipment: Kasandra Knudsen - single point;Walker - 2 wheels;Shower seat;Bedside commode Additional Comments: pt lives with wife, son, dtr-in-law, and 101 yo grand-dtr    Prior Function Level of Independence: Independent with assistive device(s)         Comments: pt reports he ususally walks with rollator or RW, sometimes does not use device dependign on the day     Hand Dominance        Extremity/Trunk Assessment   Upper Extremity Assessment Upper Extremity Assessment: Generalized weakness    Lower Extremity Assessment Lower Extremity Assessment: Generalized weakness       Communication  Communication: No difficulties  Cognition Arousal/Alertness: Awake/alert Behavior During Therapy: WFL for tasks assessed/performed;Flat affect Overall Cognitive Status: No family/caregiver present to determine baseline cognitive functioning Area of Impairment: Orientation;Following commands                       Following Commands:  Follows one step commands with increased time              General Comments      Exercises     Assessment/Plan    PT Assessment Patient needs continued PT services  PT Problem List Decreased balance;Decreased knowledge of use of DME;Decreased activity tolerance;Decreased strength;Decreased mobility       PT Treatment Interventions DME instruction;Therapeutic activities;Gait training;Functional mobility training;Therapeutic exercise;Patient/family education;Balance training    PT Goals (Current goals can be found in the Care Plan section)  Acute Rehab PT Goals Patient Stated Goal: none stated PT Goal Formulation: With patient Time For Goal Achievement: 06/14/21 Potential to Achieve Goals: Fair    Frequency Min 3X/week   Barriers to discharge        Co-evaluation               AM-PAC PT "6 Clicks" Mobility  Outcome Measure Help needed turning from your back to your side while in a flat bed without using bedrails?: A Little Help needed moving from lying on your back to sitting on the side of a flat bed without using bedrails?: A Little Help needed moving to and from a bed to a chair (including a wheelchair)?: A Little Help needed standing up from a chair using your arms (e.g., wheelchair or bedside chair)?: A Little Help needed to walk in hospital room?: A Lot Help needed climbing 3-5 steps with a railing? : A Lot 6 Click Score: 16    End of Session Equipment Utilized During Treatment: Gait belt Activity Tolerance: Patient limited by fatigue Patient left: in chair;with call bell/phone within reach;with chair alarm set Nurse Communication: Mobility status PT Visit Diagnosis: Other abnormalities of gait and mobility (R26.89);Difficulty in walking, not elsewhere classified (R26.2);Muscle weakness (generalized) (M62.81)    Time: 1030-1055 PT Time Calculation (min) (ACUTE ONLY): 25 min   Charges:   PT Evaluation $PT Eval Low Complexity: 1 Low PT  Treatments $Therapeutic Activity: 8-22 mins        Baxter Flattery, PT  Acute Rehab Dept ([place]) [phone] Pager [phone]  05/31/2021   [facility] 05/31/2021, 1:55 PM

## 2021-05-31 NOTE — Progress Notes (Signed)
PROGRESS NOTE    [name]  ZOX:[id number] DOB: 1945-11-18 DOA: 05/28/2021 PCP: [name], MD    Brief Narrative:  [name] was admitted to the hospital with the working diagnosis of porto systemic hepatic encephalopathy, in the setting of hepatic cirrhosis.   75 year old male past medical history for hepatic cirrhosis, coronary disease, diastolic heart failure, type 2 diabetes mellitus and hepatocellular carcinoma who presented with altered mentation.  He was unable to give any detailed history due to cognitive impairment.  Apparently his family called EMS because of his change in mentation, persistent lethargy.  Questionable patient compliance with his medications.  On his initial physical examination blood pressure 154/72, heart rate 98, respiratory rate 18, oxygen saturation 96%, his lungs had no wheezing or rhonchi, heart S1-S2, present, rhythmic, abdomen was soft nontender, no lower extremity edema.  Patient was somnolent, withdraws from painful stimuli, not following commands.  Sodium 139, potassium 4.5, chloride 111, bicarb 21, glucose 137, BUN 23, creatinine 1.13, alkaline phosphatase 163, AST 34, ALT 24, ammonia 203, white count 3.0, hemoglobin 7.1, hematocrit 24.1, platelets 121. SARS COVID-19 negative.  Urinalysis [place] gravity 1.009, 21-50 white cells.  6-10 red cells. Toxicology negative, alcohol less than 10.  Head CT no acute changes.  Chest radiograph with cardiomegaly, hilar vascular congestion.  EKG 73 bpm, normal axis, normal QTC, atrial fibrillation rhythm, low voltage, no significant ST segment or T wave changes.  Poor R wave progression.  Patient was placed on lactulose, initially antibiotics and then transition to by mouth with good toleration.  Patient very weak and deconditioned, pending transfer to skilled nursing facility.   Assessment & Plan:   Principal Problem:   Hepatic encephalopathy Active Problems:   Type 2 diabetes with  complication (HCC)   GERD without esophagitis   OSA (obstructive sleep apnea)   Hepatic cirrhosis (HCC)   Obesity (BMI 30.0-34.9)   Iron deficiency anemia   Hepatic porto-systemic encephalopathy/ acute on chronic liver failure with hepatic cirrhosis. Patient is more awake and alert, continue very weak and deconditioned.  No nausea or vomiting.   Continue oral lactulose, follow with PT and OT OK to discontinue rectal tube.  Diuretic therapy has been resumed with spironolactone and furosemide.   Liberate diet for now and will consult dietary for further recommendations.   2. Iron deficiency anemia. Continue oral iron supplementation  3. Obesity class 1/ OSA calculated BMI 32,7   4. T2DM. Continue glucose cover and monitoring with insulin sliding scale.  Patient is tolerating po well.   Status is: Inpatient  Remains inpatient appropriate because:Inpatient level of care appropriate due to severity of illness  Dispo: The patient is from: Home              Anticipated d/c is to: Home              Patient currently is not medically stable to d/c.   Difficult to place patient No   DVT prophylaxis: Enoxaparin   Code Status:    full  Family Communication:  No family at the bedside       Subjective: Patient is feeling better, no  nausea or vomiting, no tremors, continue to be very weak and deconditioned.   Objective: Vitals:   05/30/21 1406 05/30/21 2034 05/31/21 0508 05/31/21 1224  BP: (!) 112/57 (!) 113/53 (!) 118/52 (!) 120/53  Pulse: 61 (!) 56 67 (!) 57  Resp: 17 17 17    Temp: 98.6 F (37 C) 98.6 F (37  C) 98.1 F (36.7 C) 98.1 F (36.7 C)  TempSrc:    Oral  SpO2: 100% 100% 99% 100%  Weight:      Height:        Intake/Output Summary (Last 24 hours) at 05/31/2021 1541 Last data filed at 05/31/2021 0900 Gross per 24 hour  Intake 360 ml  Output 400 ml  Net -40 ml   Filed Weights   05/28/21 0128 05/29/21 0436 05/30/21 0448  Weight: 103.5 kg 97.6 kg 98.3 kg     Examination:   General: deconditioned and ill looking appearing  Neurology: Awake and alert, non focal. Slow to respond but follows commands, no asterixis.  E ENT: no pallor, no icterus, oral mucosa moist Cardiovascular: No JVD. S1-S2 present, rhythmic, no gallops, rubs, or murmurs. +++ non pitting bilateral lower extremity edema. Pulmonary: positive breath sounds bilaterally, adequate air movement, no wheezing, rhonchi or rales. Gastrointestinal. Abdomen soft and non tender Skin. No rashes Musculoskeletal: no joint deformities     Data Reviewed: I have personally reviewed following labs and imaging studies  CBC: Recent Labs  Lab 05/28/21 0125 05/29/21 0544 05/29/21 1707 05/30/21 0533 05/31/21 0448  WBC 3.0* 3.1*  --  3.1* 3.0*  NEUTROABS 2.4  --   --  2.2 2.3  HGB 7.1* 6.7* 8.1* 7.4* 7.3*  HCT 24.1* 22.6* 27.0* 24.5* 24.1*  MCV 87.3 85.9  --  85.4 84.6  PLT 121* 115*  --  91* 91*   Basic Metabolic Panel: Recent Labs  Lab 05/28/21 0125 05/29/21 0544 05/30/21 0533 05/31/21 0448  NA 139 142 138 132*  K 4.5 3.6 3.6 4.1  CL 111 113* 110 105  CO2 21* 21* 22 23  GLUCOSE 137* 112* 125* 97  BUN 23 21 19 19   CREATININE 1.13 1.01 0.79 1.07  CALCIUM 8.8* 8.8* 8.5* 8.2*  MG  --   --  1.9 1.9   GFR: Estimated Creatinine Clearance: 67.8 mL/min (by C-G formula based on SCr of 1.07 mg/dL). Liver Function Tests: Recent Labs  Lab 05/28/21 0125 05/29/21 0544 05/30/21 0533 05/31/21 0448  AST 34 34 37 36  ALT 24 21 25 25   ALKPHOS 163* 125 142* 138*  BILITOT 0.8 1.2 1.1 1.0  PROT 6.9 5.9* 5.8* 5.8*  ALBUMIN 3.3* 2.7* 2.7* 2.7*   No results for input(s): LIPASE, AMYLASE in the last 168 hours. Recent Labs  Lab 05/28/21 0125 05/30/21 0533  AMMONIA 203* 104*   Coagulation Profile: Recent Labs  Lab 05/29/21 0544  INR 1.3*   Cardiac Enzymes: No results for input(s): CKTOTAL, CKMB, CKMBINDEX, TROPONINI in the last 168 hours. BNP (last 3 results) No results for  input(s): PROBNP in the last 8760 hours. HbA1C: Recent Labs    05/29/21 0544  HGBA1C 4.8   CBG: Recent Labs  Lab 05/30/21 1204 05/30/21 1605 05/30/21 1928 05/31/21 0809 05/31/21 1203  GLUCAP 137* 209* 144* 106* 156*   Lipid Profile: No results for input(s): CHOL, HDL, LDLCALC, TRIG, CHOLHDL, LDLDIRECT in the last 72 hours. Thyroid Function Tests: No results for input(s): TSH, T4TOTAL, FREET4, T3FREE, THYROIDAB in the last 72 hours. Anemia Panel: No results for input(s): VITAMINB12, FOLATE, FERRITIN, TIBC, IRON, RETICCTPCT in the last 72 hours.    Radiology Studies: I have reviewed all of the imaging during this hospital visit personally     Scheduled Meds:  Chlorhexidine Gluconate Cloth  6 each Topical Daily   ferrous sulfate  325 mg Oral Q breakfast   furosemide  40 mg  Oral Daily   insulin aspart  0-6 Units Subcutaneous TID WC   lactulose  30 g Oral TID   pantoprazole  40 mg Oral Daily   spironolactone  100 mg Oral Daily   Continuous Infusions:   LOS: 3 days        Haidyn Chadderdon Gerome Apley, MD

## 2021-05-31 NOTE — Evaluation (Deleted)
Physical Therapy Evaluation Patient Details Name: [name] MRN: [id number] DOB: 01/14/1946 Today's Date: 05/31/2021  History of Present Illness  75 year old male adm with hepatic encephalopathy.  PMH cirrhosis, HCC, diastolic CHF, HLD, OA, OSA, DM II, HTN, GERD, arthritis, gout who presented with altered mental status.  Clinical Impression  Pt admitted with above diagnosis.  Pt currently with functional limitations due to the deficits listed below (see PT Problem List). Pt will benefit from skilled PT to increase their independence and safety with mobility to allow discharge to the venue listed below.  Pt agreeable to OOB however fatigues quickly, globally deconditioned.   Given these factors in addition to multiple recent admissions, recommend SNF at this time. Will continue to follow in acute setting       Recommendations for follow up therapy are one component of a multi-disciplinary discharge planning process, led by the attending physician.  Recommendations may be updated based on patient status, additional functional criteria and insurance authorization.  Follow Up Recommendations      Equipment Recommendations       Recommendations for Other Services       Precautions / Restrictions Precautions Precautions: Fall Restrictions Weight Bearing Restrictions: No      Mobility  Bed Mobility Overal bed mobility: Needs Assistance Bed Mobility: Supine to Sit     Supine to sit: Min assist     General bed mobility comments: assist to bring LEs off bed, incr time needed    Transfers Overall transfer level: Needs assistance Equipment used: Rolling walker (2 wheeled) Transfers: Sit to/from [facility] Sit to Stand: Min assist Stand pivot transfers: Min assist       General transfer comment: assist to rise and transition to RW. cues for hand placement. incr time; assist to steady and maneuver RW for stand pivot  Ambulation/Gait              General Gait Details: pt too fatigued  Stairs            Wheelchair Mobility    Modified Rankin (Stroke Patients Only)       Balance Overall balance assessment: Needs assistance Sitting-balance support: No upper extremity supported;Feet supported Sitting balance-Leahy Scale: Fair     Standing balance support: Bilateral upper extremity supported;During functional activity Standing balance-Leahy Scale: Poor Standing balance comment: reliant on UEs                             Pertinent Vitals/Pain Pain Assessment: No/denies pain    Home Living Family/patient expects to be discharged to:: Private residence Living Arrangements: Spouse/significant other;Children Available Help at Discharge: Family;Available 24 hours/day Type of Home: House Home Access: Stairs to enter   [employer] of Steps: 1 Home Layout: Two level;Able to live on main level with bedroom/bathroom Home Equipment: Kasandra Knudsen - single point;Walker - 2 wheels;Shower seat;Bedside commode Additional Comments: pt lives with wife, son, dtr-in-law, and 52 yo grand-dtr    Prior Function Level of Independence: Independent with assistive device(s)         Comments: pt reports he ususally walks with rollator or RW, sometimes does not use device dependign on the day     Hand Dominance        Extremity/Trunk Assessment   Upper Extremity Assessment Upper Extremity Assessment: Generalized weakness    Lower Extremity Assessment Lower Extremity Assessment: Generalized weakness       Communication   Communication: No difficulties  Cognition Arousal/Alertness: Awake/alert Behavior During Therapy: WFL for tasks assessed/performed;Flat affect Overall Cognitive Status: No family/caregiver present to determine baseline cognitive functioning Area of Impairment: Orientation;Following commands                       Following Commands: Follows one step commands with increased time               General Comments      Exercises     Assessment/Plan    PT Assessment    PT Problem List         PT Treatment Interventions      PT Goals (Current goals can be found in the Care Plan section)  Acute Rehab PT Goals Patient Stated Goal: none stated PT Goal Formulation: With patient Time For Goal Achievement: 06/14/21 Potential to Achieve Goals: Fair    Frequency     Barriers to discharge        Co-evaluation               AM-PAC PT "6 Clicks" Mobility  Outcome Measure                  End of Session              Time: 1030-1055 PT Time Calculation (min) (ACUTE ONLY): 25 min   Charges:   PT Evaluation $PT Eval Low Complexity: 1 Low PT Treatments $Therapeutic Activity: 8-22 mins        Baxter Flattery, PT  Acute Rehab Dept ([place]) [phone] Pager [phone]  05/31/2021   [facility] 05/31/2021, 1:50 PM

## 2021-05-31 NOTE — Consult Note (Signed)
[facility] Inpatient Consult   05/31/2021  [name] [birth year] [id number]  Patient chart has been reviewed for readmissions less than 30 days and for extreme high risk score unplanned readmissions.  Patient assessed for community [place] Management follow up needs.    Per review, patient's primary provider office offers embedded chronic care management (CCM) services. Noted: attempts to reach patient through PCP office for possible CCM services were unsuccessful and case closed after 3 attempts.  Patient still eligible for [facility] CM services if willing.  Plan: Will continue to follow for progression and disposition plans.  Of note, [facility] Care Management services does not replace or interfere with any services that are arranged by inpatient case management or social work.   Netta Cedars, MSN, [facility] Liaison Nurse Mobile Phone [phone]  Toll free office [phone]

## 2021-06-01 ENCOUNTER — Ambulatory Visit: Payer: Medicare HMO | Admitting: Internal Medicine

## 2021-06-01 DIAGNOSIS — E669 Obesity, unspecified: Secondary | ICD-10-CM | POA: Diagnosis not present

## 2021-06-01 DIAGNOSIS — K219 Gastro-esophageal reflux disease without esophagitis: Secondary | ICD-10-CM | POA: Diagnosis not present

## 2021-06-01 DIAGNOSIS — D500 Iron deficiency anemia secondary to blood loss (chronic): Secondary | ICD-10-CM | POA: Diagnosis not present

## 2021-06-01 DIAGNOSIS — K7682 Hepatic encephalopathy: Secondary | ICD-10-CM | POA: Diagnosis not present

## 2021-06-01 DIAGNOSIS — E118 Type 2 diabetes mellitus with unspecified complications: Secondary | ICD-10-CM

## 2021-06-01 LAB — GLUCOSE, CAPILLARY
Glucose-Capillary: 101 mg/dL — ABNORMAL HIGH (ref 70–99)
Glucose-Capillary: 117 mg/dL — ABNORMAL HIGH (ref 70–99)
Glucose-Capillary: 104 mg/dL — ABNORMAL HIGH (ref 70–99)

## 2021-06-01 LAB — BASIC METABOLIC PANEL
Anion gap: 6 (ref 5–15)
BUN: 21 mg/dL (ref 8–23)
CO2: 23 mmol/L (ref 22–32)
Calcium: 9 mg/dL (ref 8.9–10.3)
Chloride: 110 mmol/L (ref 98–111)
Creatinine, Ser: 1.07 mg/dL (ref 0.61–1.24)
GFR, Estimated: >60 mL/min (ref >60)
Glucose, Bld: 113 mg/dL — ABNORMAL HIGH (ref 70–99)
Potassium: 4.3 mmol/L (ref 3.5–5.1)
Sodium: 139 mmol/L (ref 135–145)

## 2021-06-01 MED ORDER — ADULT MULTIVITAMIN W/MINERALS CH
1.0000 | ORAL_TABLET | Freq: Every day | ORAL | Status: DC
  Administered 2021-06-01 – 2021-06-03 (×3): 1 via ORAL
  Filled 2021-06-01 (×3): qty 1

## 2021-06-01 MED ORDER — LACTULOSE 10 GM/15ML PO SOLN
30.0000 g | Freq: Four times a day (QID) | ORAL | Status: DC
  Administered 2021-06-01 – 2021-06-03 (×8): 30 g via ORAL
  Filled 2021-06-01 (×9): qty 45

## 2021-06-01 MED ORDER — ENSURE ENLIVE PO LIQD
237.0000 mL | Freq: Two times a day (BID) | ORAL | Status: DC
  Administered 2021-06-02 – 2021-06-03 (×4): 237 mL via ORAL

## 2021-06-01 NOTE — Progress Notes (Addendum)
Initial Nutrition Assessment  DOCUMENTATION CODES:   Not applicable  INTERVENTION:   Multivitamin w/ minerals daily  Ensure Enlive po BID, each supplement provides 350 kcal and 20 grams of protein  Encourage good PO intake  NUTRITION DIAGNOSIS:   Increased nutrient needs related to chronic illness (cirrhosis) as evidenced by estimated needs.  GOAL:   Patient will meet greater than or equal to 90% of their needs  MONITOR:   PO intake, Supplement acceptance, Labs, Weight trends  REASON FOR ASSESSMENT:   Consult Assessment of nutrition requirement/status  ASSESSMENT:   75 y.o. male presenting to the ED with altered mental status. PMH of cirrhosis, hepatocellular carcinoma, T2DM, GERD, Iron deficiency anemia, and diastolic HF.    Pt was unable to provide nutrition related history, was in and out of sleep. Pt's breakfast try was in front of pt and was not touched.  Per EMR, pt intake includes: 10/1: Lunch 50%, Dinner 100% 10/2: Breakfast 100%, Lunch 75%, Dinner 100% 10/3: Breakfast 100% 10/4: Breakfast 0%, Lunch 0%  Unable to obtain weight history from pt. Per EMR, pt had 11.8% weight loss in ~2 weeks, unable to determine if related to fluid loss.   Per nurse, pt is slow to wake up in the morning. States that once he wakes up he eats just fine.   Will start pt on ONS due to inconsistent intake.  Medications reviewed and include: Ferrous Sulfate, Lasix, SSI 0-6 units w/ meals, Lactulose, Protonix, Spironolactone Labs reviewed: 24 hr BG trends 101-186 mg/dL, Hgb A1c 4.8%, Ammonia 104  Current weight: 91.3 kg Admission weight: 103.5 kg  UOP: 1000 mL x 24 hours I/O's: -4682 mL since admit  NUTRITION - FOCUSED PHYSICAL EXAM:  Flowsheet Row Most Recent Value  Orbital Region No depletion  Upper Arm Region No depletion  Thoracic and Lumbar Region No depletion  Buccal Region No depletion  Temple Region No depletion  Clavicle Bone Region No depletion  Clavicle and  Acromion Bone Region No depletion  Scapular Bone Region No depletion  Dorsal Hand No depletion  Patellar Region No depletion  Anterior Thigh Region No depletion  Posterior Calf Region No depletion  Edema (RD Assessment) Severe  Hair Reviewed  Eyes Reviewed  Mouth Unable to assess  [pt unable to fully follow]  Skin Reviewed  Nails Reviewed       Diet Order:   Diet Order             Diet regular Room service appropriate? Yes; Fluid consistency: Thin  Diet effective now                   EDUCATION NEEDS:   No education needs have been identified at this time  Skin:  Skin Assessment: Reviewed RN Assessment (Abrasion; Ecchymosis)  Last BM:  05/31/2021  Height:   Ht Readings from Last 1 Encounters:  05/28/21 5\' 8"  (1.727 m)    Weight:   Wt Readings from Last 1 Encounters:  06/01/21 91.3 kg    Ideal Body Weight:     BMI:  Body mass index is 30.6 kg/m.  Estimated Nutritional Needs:   Kcal:  2100-2300  Protein:  105-120 grams  Fluid:  >/= 2.1 L    [name] BS, PLDN Clinical Dietitian See AMiON for contact information.

## 2021-06-01 NOTE — TOC Initial Note (Signed)
Transition of Care [facility]) - Initial/Assessment Note    Patient Details  Name: [name] MRN: [id number] Date of Birth: Oct 26, 1945  Transition of Care [facility]) CM/SW Contact:    Trish Mage, LCSW Phone Number: 06/01/2021, 3:32 PM  Clinical Narrative:   Patient seen in follow up to PT recommendation of SNF.  Mr Nichelson states he has no intention of going to SNF, plans on returning home where he resides with family.  Gave me permission to call son [name], who confirms that he is willing to continue caring for his dad, with help from patient's wife, granddaughter and nephew.  They have all needed DME, but thinks that a bed would be good if they can afford it.  Will ask [facility] to call with information to son.  [name] also says that PT was supposed to be working with his father, but due to multiple ED visits, admissions, no one has been out recently.  Contacted [facility] to see if patient is still open to them. TOC will continue to follow during the course of hospitalization.                 Expected Discharge Plan: [place] Barriers to Discharge: No Barriers Identified   Patient Goals and CMS Choice        Expected Discharge Plan and Services Expected Discharge Plan: [place]                                              Prior Living Arrangements/Services                       Activities of Daily Living Home Assistive Devices/Equipment: Cane (specify quad or straight), Walker (specify type), Shower chair with back, Bedside commode/3-in-1, Other (Comment) (single point cane, front wheeled walker, tub/shower unit, standard height toilet) ADL Screening (condition at time of admission) Patient's cognitive ability adequate to safely complete daily activities?: No (patient very slow to respond) Is the patient deaf or have difficulty hearing?: No Does the patient have difficulty seeing, even when wearing glasses/contacts?: No Does the  patient have difficulty concentrating, remembering, or making decisions?: Yes Patient able to express need for assistance with ADLs?: Yes Does the patient have difficulty dressing or bathing?: Yes Independently performs ADLs?: No Communication: Independent Dressing (OT): Needs assistance Is this a change from baseline?: Change from baseline, expected to last >3 days Grooming: Needs assistance Is this a change from baseline?: Change from baseline, expected to last >3 days Feeding: Needs assistance Is this a change from baseline?: Change from baseline, expected to last >3 days Bathing: Needs assistance Is this a change from baseline?: Change from baseline, expected to last >3 days Toileting: Needs assistance Is this a change from baseline?: Change from baseline, expected to last >3days In/Out Bed: Needs assistance Is this a change from baseline?: Change from baseline, expected to last >3 days Walks in Home: Needs assistance Is this a change from baseline?: Change from baseline, expected to last >3 days Does the patient have difficulty walking or climbing stairs?: Yes (secondary to weakness) Weakness of Legs: Both Weakness of Arms/Hands: None  Permission Sought/Granted                  Emotional Assessment  Admission diagnosis:  Hepatic encephalopathy [K76.82] Patient Active Problem List   Diagnosis Date Noted   Acute upper GI bleed 04/29/2021   Cirrhosis ([place]) 04/29/2021   Acute on chronic anemia 04/28/2021   Hepatic encephalopathy 01/22/2021   GI bleeding 09/10/2020   Hepatocellular carcinoma ([place]) 08/09/2020   Routine general medical examination at a health care facility 05/06/2020   Iron deficiency anemia    Pancytopenia ([place]) 04/15/2020   Pulmonary hypertension, unspecified ([place]) 03/11/2020   Asthma, mild intermittent 87/56/4332   Alcoholic cirrhosis of liver with ascites ([place]) 12/27/2019   Acute on chronic diastolic CHF (congestive heart failure)  ([place]) 11/01/2019   (HFpEF) heart failure with preserved ejection fraction ([place]) 12/24/2018   Peptic ulcer disease 12/18/2017   GI bleed 09/29/2017   SOB (shortness of breath) 09/21/2017   Obesity (BMI 30.0-34.9) 02/05/2016   S/P CABG x 3 03/31/2014   Hepatic cirrhosis ([place]) 02/27/2014   [place] 08/14/2012   Hyperlipidemia with target LDL less than 70 06/29/2012   Thrombocytopenia ([place]) 06/29/2012   Coronary artery disease involving native coronary artery of native heart without angina pectoris    OSA (obstructive sleep apnea)    Type 2 diabetes with complication ([place]) 95/18/8416   GERD without esophagitis 08/04/2010   History of gout    Hypertensive heart disease    PCP:  [name], MD Pharmacy:   [facility] [place], [place] - [address] AT [facility] OF [address] [place] [address] Phone: [phone] Fax: [phone]     Social Determinants of Health (SDOH) Interventions    Readmission Risk Interventions Readmission Risk Prevention Plan 01/26/2021 08/12/2020 08/12/2020  Transportation Screening Complete - Complete  PCP or Specialist Appt within 5-7 Days - - -  Not Complete comments - - -  PCP or Specialist Appt within 3-5 Days - Complete -  Home Care Screening - - -  Medication Review (RN CM) - - -  [place] or [place] - - Complete  Social Work Consult for [place] Planning/Counseling - - Complete  Palliative Care Screening - - Not Applicable  Medication Review (RN Care Manager) Complete - -  PCP or Specialist appointment within 3-5 days of discharge Complete - -  [place] or Home Care Consult Complete - -  SW Recovery Care/Counseling Consult Complete - -  Palliative Care Screening Not Applicable - -  [place] Patient Refused - -  Some recent data might be hidden

## 2021-06-01 NOTE — Evaluation (Addendum)
Occupational Therapy Evaluation Patient Details Name: [name] MRN: [id number] DOB: [birth year] Today's Date: 06/01/2021   History of Present Illness Patient is a 75 year old male adm with hepatic encephalopathy.  PMH cirrhosis, HCC, diastolic CHF, HLD, OA, OSA, DM II, HTN, GERD, arthritis, gout who presented with altered mental status.   Clinical Impression   Patient is a 75 year old male who was admitted for above.  Patient was living at home with wife prior level. Patient was noted to be increasingly lethargic on this date with increased time to participate in all tasks. Patient unable to complete safe transfer with toes off floor and posterior leaning in standing with RW +2 assistance. Patient would continue to benefit from skilled OT services at this time while admitted and after d/c to address noted deficits in order to improve overall safety and independence in ADLs.       Recommendations for follow up therapy are one component of a multi-disciplinary discharge planning process, led by the attending physician.  Recommendations may be updated based on patient status, additional functional criteria and insurance authorization.   Follow Up Recommendations  SNF    Equipment Recommendations  Other (comment) (defer to next venue)    Recommendations for Other Services       Precautions / Restrictions Precautions Precautions: Fall Restrictions Weight Bearing Restrictions: No      Mobility Bed Mobility Overal bed mobility: Needs Assistance Bed Mobility: Supine to Sit     Supine to sit: Max assist     General bed mobility comments: patient was noted to need increased time and was increasingly lethargic on this date.    Transfers Overall transfer level: Needs assistance Equipment used: Rolling walker (2 wheeled) Transfers: Sit to/from Stand Sit to Stand: Mod assist;From elevated surface         General transfer comment: patient needed physical assist to rise into  standing with RW x 2 with inability to transfer with patient leaning posteriorly onto bed with toes off the ground.    Balance Overall balance assessment: Needs assistance Sitting-balance support: No upper extremity supported;Feet supported Sitting balance-Leahy Scale: Fair     Standing balance support: Bilateral upper extremity supported;During functional activity Standing balance-Leahy Scale: Poor Standing balance comment: posterior leaning with toes off the floor                           ADL either performed or assessed with clinical judgement   ADL Overall ADL's : Needs assistance/impaired   Eating/Feeding Details (indicate cue type and reason): patient too lethargic to attempt Grooming: Wash/dry face;Sitting;Minimal assistance Grooming Details (indicate cue type and reason): with increased time Upper Body Bathing: Moderate assistance;Sitting   Lower Body Bathing: Maximal assistance;Bed level   Upper Body Dressing : Moderate assistance;Sitting   Lower Body Dressing: Bed level;Maximal assistance   Toilet Transfer: +2 for safety/equipment;Moderate assistance;+2 for physical assistance;RW;BSC Toilet Transfer Details (indicate cue type and reason): transfer attempted but patient leaning posteriorly with toes off floor. patient unable to follow cues to put toes down. nurse assist to use urinal standing with RW. Toileting- Clothing Manipulation and Hygiene: Maximal assistance;Sit to/from stand patient was unable to assist with urinal seated or supine positioning on this date.                Vision Patient Visual Report: No change from baseline       Perception     Praxis  Pertinent Vitals/Pain Pain Assessment: No/denies pain     Hand Dominance Right   Extremity/Trunk Assessment Upper Extremity Assessment Upper Extremity Assessment: Generalized weakness   Lower Extremity Assessment Lower Extremity Assessment: Generalized weakness   Cervical /  Trunk Assessment Cervical / Trunk Assessment: Normal   Communication Communication Communication: No difficulties   Cognition Arousal/Alertness: Awake/alert Behavior During Therapy: WFL for tasks assessed/performed;Flat affect Overall Cognitive Status: No family/caregiver present to determine baseline cognitive functioning Area of Impairment: Orientation;Following commands                       Following Commands: Follows one step commands with increased time           General Comments       Exercises     Shoulder Instructions      Home Living Family/patient expects to be discharged to:: Private residence Living Arrangements: Spouse/significant other;Children Available Help at Discharge: Family;Available 24 hours/day Type of Home: House Home Access: Stairs to enter [employer] of Steps: 1 Entrance Stairs-Rails: Left Home Layout: Two level;Able to live on main level with bedroom/bathroom     Bathroom Shower/Tub: Teacher, early years/pre: Standard Bathroom Accessibility: Yes How Accessible: Accessible via walker Home Equipment: [place] - single point;Walker - 2 wheels;Shower seat;Bedside commode   Additional Comments: pt lives with wife, son, dtr-in-law, and 21 yo grand-dtr      Prior Functioning/Environment Level of Independence: Independent with assistive device(s)        Comments: pt reports he ususally walks with rollator or RW, sometimes does not use device dependign on the day        OT Problem List: Decreased strength;Increased edema;Decreased knowledge of precautions;Decreased activity tolerance;Decreased safety awareness;Impaired balance (sitting and/or standing);Decreased knowledge of use of DME or AE      OT Treatment/Interventions:      OT Goals(Current goals can be found in the care plan section) Acute Rehab OT Goals Patient Stated Goal: to go to bathroom OT Goal Formulation: With patient Time For Goal Achievement:  06/15/21 Potential to Achieve Goals: Good  OT Frequency:     Barriers to D/C:            Co-evaluation              AM-PAC OT "6 Clicks" Daily Activity     Outcome Measure Help from another person eating meals?: A Little Help from another person taking care of personal grooming?: A Little Help from another person toileting, which includes using toliet, bedpan, or urinal?: A Lot Help from another person bathing (including washing, rinsing, drying)?: A Lot Help from another person to put on and taking off regular upper body clothing?: A Lot Help from another person to put on and taking off regular lower body clothing?: A Lot 6 Click Score: 14   End of Session Equipment Utilized During Treatment: Gait belt;Rolling walker Nurse Communication: Other (comment) (nurse present to assist with use of urinal)  Activity Tolerance: Patient limited by fatigue Patient left: in bed;with call bell/phone within reach;with bed alarm set  OT Visit Diagnosis: Unsteadiness on feet (R26.81)                Time: 5643-3295 OT Time Calculation (min): 16 min Charges:  OT General Charges $OT Visit: 1 Visit OT Evaluation $OT Eval Moderate Complexity: 1 Mod  Jackelyn Poling OTR/L, MS Acute Rehabilitation Department Office# [phone] Pager# [phone]   [place] 06/01/2021, 3:05 PM

## 2021-06-01 NOTE — Progress Notes (Signed)
Pt refused CPAP qhs.  Pt encouraged to contact RT should he change his mind.   

## 2021-06-01 NOTE — Care Management Important Message (Signed)
Important Message  Patient Details IM Letter given to the Patient. Name: [name] MRN: [id number] Date of Birth: 1946/05/14   Medicare Important Message Given:  Yes     Kerin Salen 06/01/2021, 12:21 PM

## 2021-06-01 NOTE — Progress Notes (Addendum)
PROGRESS NOTE    [name]  ZDG:[id number] DOB: [birth year] DOA: 05/28/2021 PCP: [name], MD    Brief Narrative:  [name] was admitted to the hospital with the working diagnosis of porto systemic hepatic encephalopathy, in the setting of hepatic cirrhosis.    75 year old male past medical history for hepatic cirrhosis, coronary disease, diastolic heart failure, type 2 diabetes mellitus and hepatocellular carcinoma who presented with altered mentation.  He was unable to give any detailed history due to cognitive impairment.  Apparently his family called EMS because of his change in mentation, persistent lethargy.  Questionable patient non compliant with his medications.  On his initial physical examination blood pressure 154/72, heart rate 98, respiratory rate 18, oxygen saturation 96%, his lungs had no wheezing or rhonchi, heart S1-S2, present, rhythmic, abdomen was soft nontender, no lower extremity edema.  Patient was somnolent, withdraws from painful stimuli, not following commands.   Sodium 139, potassium 4.5, chloride 111, bicarb 21, glucose 137, BUN 23, creatinine 1.13, alkaline phosphatase 163, AST 34, ALT 24, ammonia 203, white count 3.0, hemoglobin 7.1, hematocrit 24.1, platelets 121. SARS COVID-19 negative.   Urinalysis [place] gravity 1.009, 21-50 white cells.  6-10 red cells. Toxicology negative, alcohol less than 10.   Head CT no acute changes.   Chest radiograph with cardiomegaly, hilar vascular congestion.   EKG 73 bpm, normal axis, normal QTC, atrial fibrillation rhythm, low voltage, no significant ST segment or T wave changes.  Poor R wave progression.   Patient was placed on lactulose, initially antibiotics and then transition to by mouth with good toleration.   Patient very weak and deconditioned, pending transfer to skilled nursing facility.   Assessment & Plan:   Principal Problem:   Hepatic encephalopathy Active Problems:   Type 2 diabetes  with complication (HCC)   GERD without esophagitis   OSA (obstructive sleep apnea)   Hepatic cirrhosis (HCC)   Obesity (BMI 30.0-34.9)   Iron deficiency anemia   Hepatic porto-systemic encephalopathy/ acute on chronic liver failure with hepatic cirrhosis.  Patient continue to be very weak and deconditioned. Today at the time of my examination he is more somnolent than yesterday.    Will increase lactulose to 30 g qid and will follow on ammonia level in am. Continue neuro checks and aspiration precautions. Patient will need SNF for further nursing care.  Continue with diuretic therapy with furosemide and spironolactone.    2. Iron deficiency anemia.  On iron supplementation   3. Obesity class 1/ OSA BMI 32,7    4. T2DM.  Capillary glucose has been stable 186, 137, 101 and 117, Fasting glucose is 113 mg/dl.  Will discontinue insulin sliding scale.   Patient continue to be at high risk for worsening encephalopathy   Status is: Inpatient  Remains inpatient appropriate because:Inpatient level of care appropriate due to severity of illness  Dispo: The patient is from: Home              Anticipated d/c is to: SNF              Patient currently is not medically stable to d/c.   Difficult to place patient No  DVT prophylaxis: Enoxaparin   Code Status:    full  Family Communication:    I was not able to reach his son over the phone, I left a message.     Nutrition Status: Nutrition Problem: Increased nutrient needs Etiology: chronic illness (cirrhosis) Signs/Symptoms: estimated needs Interventions: MVI, Ensure Enlive (  each supplement provides 350kcal and 20 grams of protein)      Subjective: Patient today is more somnolent than yesterday, responds to simple questions, continue to be very weak and deconditioned   Objective: Vitals:   05/31/21 1224 06/01/21 0432 06/01/21 0454 06/01/21 1213  BP: (!) 120/53 (!) 109/49  (!) 132/55  Pulse: (!) 57 67  (!) 59  Resp:  20  16   Temp: 98.1 F (36.7 C) 98.1 F (36.7 C)    TempSrc: Oral     SpO2: 100% 99%  100%  Weight:   91.3 kg   Height:        Intake/Output Summary (Last 24 hours) at 06/01/2021 1344 Last data filed at 06/01/2021 1300 Gross per 24 hour  Intake 0 ml  Output 1000 ml  Net -1000 ml   Filed Weights   05/29/21 0436 05/30/21 0448 06/01/21 0454  Weight: 97.6 kg 98.3 kg 91.3 kg    Examination:   General: Not in pain or dyspnea  Neurology: Awake and alert, non focal  E ENT: positive pallor, no icterus, oral mucosa moist Cardiovascular: No JVD. S1-S2 present, rhythmic, no gallops, rubs, or murmurs. ++ non pitting bilateral lower extremity edema. Pulmonary: positive breath sounds bilaterally, adequate air movement, no wheezing, rhonchi or rales. Gastrointestinal. Abdomen soft and non tender Skin. No rashes, bilateral skin decoloration.  Musculoskeletal: no joint deformities     Data Reviewed: I have personally reviewed following labs and imaging studies  CBC: Recent Labs  Lab 05/28/21 0125 05/29/21 0544 05/29/21 1707 05/30/21 0533 05/31/21 0448  WBC 3.0* 3.1*  --  3.1* 3.0*  NEUTROABS 2.4  --   --  2.2 2.3  HGB 7.1* 6.7* 8.1* 7.4* 7.3*  HCT 24.1* 22.6* 27.0* 24.5* 24.1*  MCV 87.3 85.9  --  85.4 84.6  PLT 121* 115*  --  91* 91*   Basic Metabolic Panel: Recent Labs  Lab 05/28/21 0125 05/29/21 0544 05/30/21 0533 05/31/21 0448 06/01/21 0502  NA 139 142 138 132* 139  K 4.5 3.6 3.6 4.1 4.3  CL 111 113* 110 105 110  CO2 21* 21* 22 23 23   GLUCOSE 137* 112* 125* 97 113*  BUN 23 21 19 19 21   CREATININE 1.13 1.01 0.79 1.07 1.07  CALCIUM 8.8* 8.8* 8.5* 8.2* 9.0  MG  --   --  1.9 1.9  --    GFR: Estimated Creatinine Clearance: 65.5 mL/min (by C-G formula based on SCr of 1.07 mg/dL). Liver Function Tests: Recent Labs  Lab 05/28/21 0125 05/29/21 0544 05/30/21 0533 05/31/21 0448  AST 34 34 37 36  ALT 24 21 25 25   ALKPHOS 163* 125 142* 138*  BILITOT 0.8 1.2 1.1 1.0  PROT  6.9 5.9* 5.8* 5.8*  ALBUMIN 3.3* 2.7* 2.7* 2.7*   No results for input(s): LIPASE, AMYLASE in the last 168 hours. Recent Labs  Lab 05/28/21 0125 05/30/21 0533  AMMONIA 203* 104*   Coagulation Profile: Recent Labs  Lab 05/29/21 0544  INR 1.3*   Cardiac Enzymes: No results for input(s): CKTOTAL, CKMB, CKMBINDEX, TROPONINI in the last 168 hours. BNP (last 3 results) No results for input(s): PROBNP in the last 8760 hours. HbA1C: No results for input(s): HGBA1C in the last 72 hours. CBG: Recent Labs  Lab 05/31/21 1203 05/31/21 1724 05/31/21 2114 06/01/21 0800 06/01/21 1215  GLUCAP 156* 186* 137* 101* 117*   Lipid Profile: No results for input(s): CHOL, HDL, LDLCALC, TRIG, CHOLHDL, LDLDIRECT in the last 72 hours. Thyroid  Function Tests: No results for input(s): TSH, T4TOTAL, FREET4, T3FREE, THYROIDAB in the last 72 hours. Anemia Panel: No results for input(s): VITAMINB12, FOLATE, FERRITIN, TIBC, IRON, RETICCTPCT in the last 72 hours.    Radiology Studies: I have reviewed all of the imaging during this hospital visit personally     Scheduled Meds:  Chlorhexidine Gluconate Cloth  6 each Topical Daily   ferrous sulfate  325 mg Oral Q breakfast   furosemide  40 mg Oral Daily   insulin aspart  0-6 Units Subcutaneous TID WC   lactulose  30 g Oral TID   pantoprazole  40 mg Oral Daily   spironolactone  100 mg Oral Daily   Continuous Infusions:   LOS: 4 days        Dacoda Spallone Gerome Apley, MD

## 2021-06-02 DIAGNOSIS — K219 Gastro-esophageal reflux disease without esophagitis: Secondary | ICD-10-CM | POA: Diagnosis not present

## 2021-06-02 DIAGNOSIS — K7682 Hepatic encephalopathy: Secondary | ICD-10-CM | POA: Diagnosis not present

## 2021-06-02 DIAGNOSIS — D500 Iron deficiency anemia secondary to blood loss (chronic): Secondary | ICD-10-CM | POA: Diagnosis not present

## 2021-06-02 DIAGNOSIS — E669 Obesity, unspecified: Secondary | ICD-10-CM | POA: Diagnosis not present

## 2021-06-02 LAB — BASIC METABOLIC PANEL
Anion gap: 10 (ref 5–15)
BUN: 19 mg/dL (ref 8–23)
CO2: 22 mmol/L (ref 22–32)
Calcium: 8.8 mg/dL — ABNORMAL LOW (ref 8.9–10.3)
Chloride: 105 mmol/L (ref 98–111)
Creatinine, Ser: 0.86 mg/dL (ref 0.61–1.24)
GFR, Estimated: >60 mL/min (ref >60)
Glucose, Bld: 101 mg/dL — ABNORMAL HIGH (ref 70–99)
Potassium: 3.9 mmol/L (ref 3.5–5.1)
Sodium: 137 mmol/L (ref 135–145)

## 2021-06-02 LAB — AMMONIA: Ammonia: 121 umol/L — ABNORMAL HIGH (ref 9–35)

## 2021-06-02 NOTE — Progress Notes (Signed)
Patient continues to decline nocturnal CPAP. He states he has not used one at home in several years and does not feel he needs to use one. Order changed to prn. He is aware that he may request RT assistance if he should change his mind.

## 2021-06-02 NOTE — Progress Notes (Signed)
Physical Therapy Treatment Patient Details Name: [name] MRN: [id number] DOB: [birth year] Today's Date: 06/02/2021   History of Present Illness Patient is a 75 year old male adm with hepatic encephalopathy.  PMH cirrhosis, HCC, diastolic CHF, HLD, OA, OSA, DM II, HTN, GERD, arthritis, gout who presented with altered mental status.    PT Comments    Pt assisted with ambulating in hallway short distance and requiring min assist for stability and weakness.  Pt refusing SNF per notes, so if home, recommend HHPT and 24/7 assist for safety.    Recommendations for follow up therapy are one component of a multi-disciplinary discharge planning process, led by the attending physician.  Recommendations may be updated based on patient status, additional functional criteria and insurance authorization.  Follow Up Recommendations  SNF;Supervision/Assistance - 24 hour     Equipment Recommendations  Rolling walker with 5" wheels    Recommendations for Other Services       Precautions / Restrictions Precautions Precautions: Fall Restrictions Weight Bearing Restrictions: No     Mobility  Bed Mobility Overal bed mobility: Needs Assistance Bed Mobility: Sit to Supine       Sit to supine: Mod assist   General bed mobility comments: pt sitting EOB finished with breakfast on arrival; assist required for legs onto bed    Transfers Overall transfer level: Needs assistance Equipment used: Rolling walker (2 wheeled) Transfers: Sit to/from Stand Sit to Stand: Min assist         General transfer comment: multimodal cues for hand placement and weight shifting  Ambulation/Gait Ambulation/Gait assistance: Min assist Gait Distance (Feet): 45 Feet Assistive device: Rolling walker (2 wheeled) Gait Pattern/deviations: Step-through pattern;Decreased stride length;Trunk flexed     General Gait Details: multimodal cues for RW positioning and staying within RW, and required assist for  negotiating RW   Stairs             Wheelchair Mobility    Modified Rankin (Stroke Patients Only)       Balance Overall balance assessment: Needs assistance         Standing balance support: Bilateral upper extremity supported Standing balance-Leahy Scale: Poor Standing balance comment: posterior leaning with toes off the floor, cues for correction upon standing, reliant on UE support                            Cognition Arousal/Alertness: Awake/alert Behavior During Therapy: Flat affect   Area of Impairment: Following commands;Safety/judgement                       Following Commands: Follows one step commands with increased time Safety/Judgement: Decreased awareness of deficits;Decreased awareness of safety     General Comments: pt able to follow commands however does present with mild impairments in cognition requiring cues for technique and safety      Exercises      General Comments        Pertinent Vitals/Pain Pain Assessment: No/denies pain    Home Living                      Prior Function            PT Goals (current goals can now be found in the care plan section) Progress towards PT goals: Progressing toward goals    Frequency    Min 3X/week      PT Plan Current plan remains appropriate  Co-evaluation              AM-PAC PT "6 Clicks" Mobility   Outcome Measure  Help needed turning from your back to your side while in a flat bed without using bedrails?: A Little Help needed moving from lying on your back to sitting on the side of a flat bed without using bedrails?: A Lot Help needed moving to and from a bed to a chair (including a wheelchair)?: A Little Help needed standing up from a chair using your arms (e.g., wheelchair or bedside chair)?: A Little Help needed to walk in hospital room?: A Little Help needed climbing 3-5 steps with a railing? : A Lot 6 Click Score: 16    End of  Session Equipment Utilized During Treatment: Gait belt Activity Tolerance: Patient limited by fatigue Patient left: with call bell/phone within reach;in bed;with bed alarm set   PT Visit Diagnosis: Difficulty in walking, not elsewhere classified (R26.2);Muscle weakness (generalized) (M62.81)     Time: 0165-5374 PT Time Calculation (min) (ACUTE ONLY): 15 min  Charges:  $Gait Training: 8-22 mins                     Jannette Spanner PT, DPT Acute Rehabilitation Services Pager: [phone] Office: [place] 06/02/2021, 1:38 PM

## 2021-06-02 NOTE — Clinical Social Work Note (Cosign Needed Addendum)
    Durable Medical Equipment  (From admission, onward)           Start     Ordered   06/01/21 1541  For home use only DME Hospital bed  Once       Question Answer Comment  Length of Need 6 Months   Head must be elevated greater than: 45 degrees   Bed type Semi-electric      06/01/21 1540           Patient requires the ability to reposition frequently

## 2021-06-02 NOTE — Progress Notes (Signed)
PROGRESS NOTE    [name]  TGG:[id number] DOB: 11/04/1945 DOA: 05/28/2021 PCP: [name], MD    Brief Narrative:  Mr. Dollins was admitted to the hospital with the working diagnosis of porto systemic hepatic encephalopathy, in the setting of hepatic cirrhosis.    75 year old male past medical history for hepatic cirrhosis, coronary disease, diastolic heart failure, type 2 diabetes mellitus and hepatocellular carcinoma who presented with altered mentation.  He was unable to give any detailed history due to cognitive impairment.  Apparently his family called EMS because of his change in mentation, persistent lethargy.  Questionable patient non compliant with his medications.  On his initial physical examination blood pressure 154/72, heart rate 98, respiratory rate 18, oxygen saturation 96%, his lungs had no wheezing or rhonchi, heart S1-S2, present, rhythmic, abdomen was soft nontender, no lower extremity edema.  Patient was somnolent, withdraws from painful stimuli, not following commands.   Sodium 139, potassium 4.5, chloride 111, bicarb 21, glucose 137, BUN 23, creatinine 1.13, alkaline phosphatase 163, AST 34, ALT 24, ammonia 203, white count 3.0, hemoglobin 7.1, hematocrit 24.1, platelets 121. SARS COVID-19 negative.   Urinalysis [place] gravity 1.009, 21-50 white cells.  6-10 red cells. Toxicology negative, alcohol less than 10.   Head CT no acute changes.   Chest radiograph with cardiomegaly, hilar vascular congestion.   EKG 73 bpm, normal axis, normal QTC, atrial fibrillation rhythm, low voltage, no significant ST segment or T wave changes.  Poor R wave progression.   Patient was placed on lactulose, initially antibiotics and then transition to by mouth with good toleration.   Patient very weak and deconditioned, pending transfer to skilled nursing facility. Patient declined SNF, he will be dc home with home health services.    Assessment & Plan:   Principal  Problem:   Hepatic encephalopathy Active Problems:   Type 2 diabetes with complication (HCC)   GERD without esophagitis   OSA (obstructive sleep apnea)   Hepatic cirrhosis (HCC)   Obesity (BMI 30.0-34.9)   Iron deficiency anemia   Hepatic porto-systemic encephalopathy/ acute on chronic liver failure with hepatic cirrhosis.  Today his mentation has improved, he is eating at the side of the bed.  He has declined SNF and is willing to go home with home health services.    Plan to continue with lactulose to 30 g qid  His ammonia is elevated but clinically has improved compared to yesterday.  On furosemide and spironolactone for diuretic therapy.    2. Iron deficiency anemia.  Continue with iron supplementation   3. Obesity class 1/ OSA BMI 32,7    4. T2DM.  His glucose continue to be stable with capillary 101, 117 and 104. Fasting is 101. Continue close monitoring, holding on insulin therapy.    Status is: Inpatient  Remains inpatient appropriate because:Inpatient level of care appropriate due to severity of illness  Dispo: The patient is from: Home              Anticipated d/c is to: Home              Patient currently is not medically stable to d/c.   Difficult to place patient No  DVT prophylaxis: Enoxaparin   Code Status:    full  Family Communication:  I was not able to reach his family over phone, I left a message.     Nutrition Status: Nutrition Problem: Increased nutrient needs Etiology: chronic illness (cirrhosis) Signs/Symptoms: estimated needs Interventions: MVI, Ensure Enlive (  each supplement provides 350kcal and 20 grams of protein)     Subjective: Patient is more awake and alert today, no nausea or vomiting, no chest pain or dyspnea.   Objective: Vitals:   06/01/21 0454 06/01/21 1213 06/02/21 0402 06/02/21 0447  BP:  (!) 132/55 95/67   Pulse:  (!) 59 62   Resp:  16 20   Temp:   98.1 F (36.7 C)   TempSrc:   Oral   SpO2:  100% 97%   Weight:  91.3 kg   90.1 kg  Height:        Intake/Output Summary (Last 24 hours) at 06/02/2021 1240 Last data filed at 06/01/2021 1300 Gross per 24 hour  Intake 0 ml  Output 1000 ml  Net -1000 ml   Filed Weights   05/30/21 0448 06/01/21 0454 06/02/21 0447  Weight: 98.3 kg 91.3 kg 90.1 kg    Examination:   General: Not in pain, deconditioned,  Neurology: more awake and reactive today, he is following commands and responding to questions.  E ENT: mild pallor, no icterus, oral mucosa moist Cardiovascular: No JVD. S1-S2 present, rhythmic, no gallops, rubs, or murmurs. ++ non pitting bilateral lower extremity edema. Pulmonary: positive breath sounds bilaterally, adequate air movement, no wheezing, rhonchi or rales. Gastrointestinal. Abdomen soft and non tender Skin. No rashes Musculoskeletal: no joint deformities     Data Reviewed: I have personally reviewed following labs and imaging studies  CBC: Recent Labs  Lab 05/28/21 0125 05/29/21 0544 05/29/21 1707 05/30/21 0533 05/31/21 0448  WBC 3.0* 3.1*  --  3.1* 3.0*  NEUTROABS 2.4  --   --  2.2 2.3  HGB 7.1* 6.7* 8.1* 7.4* 7.3*  HCT 24.1* 22.6* 27.0* 24.5* 24.1*  MCV 87.3 85.9  --  85.4 84.6  PLT 121* 115*  --  91* 91*   Basic Metabolic Panel: Recent Labs  Lab 05/29/21 0544 05/30/21 0533 05/31/21 0448 06/01/21 0502 06/02/21 0500  NA 142 138 132* 139 137  K 3.6 3.6 4.1 4.3 3.9  CL 113* 110 105 110 105  CO2 21* 22 23 23 22   GLUCOSE 112* 125* 97 113* 101*  BUN 21 19 19 21 19   CREATININE 1.01 0.79 1.07 1.07 0.86  CALCIUM 8.8* 8.5* 8.2* 9.0 8.8*  MG  --  1.9 1.9  --   --    GFR: Estimated Creatinine Clearance: 80.9 mL/min (by C-G formula based on SCr of 0.86 mg/dL). Liver Function Tests: Recent Labs  Lab 05/28/21 0125 05/29/21 0544 05/30/21 0533 05/31/21 0448  AST 34 34 37 36  ALT 24 21 25 25   ALKPHOS 163* 125 142* 138*  BILITOT 0.8 1.2 1.1 1.0  PROT 6.9 5.9* 5.8* 5.8*  ALBUMIN 3.3* 2.7* 2.7* 2.7*   No results  for input(s): LIPASE, AMYLASE in the last 168 hours. Recent Labs  Lab 05/28/21 0125 05/30/21 0533 06/02/21 0500  AMMONIA 203* 104* 121*   Coagulation Profile: Recent Labs  Lab 05/29/21 0544  INR 1.3*   Cardiac Enzymes: No results for input(s): CKTOTAL, CKMB, CKMBINDEX, TROPONINI in the last 168 hours. BNP (last 3 results) No results for input(s): PROBNP in the last 8760 hours. HbA1C: No results for input(s): HGBA1C in the last 72 hours. CBG: Recent Labs  Lab 05/31/21 1724 05/31/21 2114 06/01/21 0800 06/01/21 1215 06/01/21 1611  GLUCAP 186* 137* 101* 117* 104*   Lipid Profile: No results for input(s): CHOL, HDL, LDLCALC, TRIG, CHOLHDL, LDLDIRECT in the last 72 hours. Thyroid Function Tests:  No results for input(s): TSH, T4TOTAL, FREET4, T3FREE, THYROIDAB in the last 72 hours. Anemia Panel: No results for input(s): VITAMINB12, FOLATE, FERRITIN, TIBC, IRON, RETICCTPCT in the last 72 hours.    Radiology Studies: I have reviewed all of the imaging during this hospital visit personally     Scheduled Meds:  Chlorhexidine Gluconate Cloth  6 each Topical Daily   feeding supplement  237 mL Oral BID BM   ferrous sulfate  325 mg Oral Q breakfast   furosemide  40 mg Oral Daily   lactulose  30 g Oral QID   multivitamin with minerals  1 tablet Oral Daily   pantoprazole  40 mg Oral Daily   spironolactone  100 mg Oral Daily   Continuous Infusions:   LOS: 5 days        Hillery Zachman Gerome Apley, MD

## 2021-06-03 DIAGNOSIS — G4733 Obstructive sleep apnea (adult) (pediatric): Secondary | ICD-10-CM | POA: Diagnosis not present

## 2021-06-03 DIAGNOSIS — E118 Type 2 diabetes mellitus with unspecified complications: Secondary | ICD-10-CM | POA: Diagnosis not present

## 2021-06-03 DIAGNOSIS — D500 Iron deficiency anemia secondary to blood loss (chronic): Secondary | ICD-10-CM | POA: Diagnosis not present

## 2021-06-03 DIAGNOSIS — K7682 Hepatic encephalopathy: Secondary | ICD-10-CM | POA: Diagnosis not present

## 2021-06-03 LAB — BASIC METABOLIC PANEL
Anion gap: 5 (ref 5–15)
BUN: 19 mg/dL (ref 8–23)
CO2: 22 mmol/L (ref 22–32)
Calcium: 8.8 mg/dL — ABNORMAL LOW (ref 8.9–10.3)
Chloride: 109 mmol/L (ref 98–111)
Creatinine, Ser: 0.97 mg/dL (ref 0.61–1.24)
GFR, Estimated: >60 mL/min (ref >60)
Glucose, Bld: 128 mg/dL — ABNORMAL HIGH (ref 70–99)
Potassium: 4.1 mmol/L (ref 3.5–5.1)
Sodium: 136 mmol/L (ref 135–145)

## 2021-06-03 MED ORDER — ADULT MULTIVITAMIN W/MINERALS CH: 1.0000 | ORAL_TABLET | Freq: Every day | ORAL | 0 refills | Status: DC

## 2021-06-03 MED ORDER — ENSURE ENLIVE PO LIQD: 237.0000 mL | Freq: Two times a day (BID) | ORAL | 0 refills | Status: AC

## 2021-06-03 NOTE — Progress Notes (Signed)
Occupational Therapy Treatment Patient Details Name: [name] MRN: [id number] DOB: Jan 21, 1946 Today's Date: 06/03/2021   History of present illness Patient is a 75 year old male adm with hepatic encephalopathy.  PMH cirrhosis, HCC, diastolic CHF, HLD, OA, OSA, DM II, HTN, GERD, arthritis, gout who presented with altered mental status.   OT comments  Patient needing max multimodal cues for problem solving, sequencing ADL task of washing face + hands at sink as well as transferring into recliner chair with walker. Patient having difficulty locating faucet handles at sink often over/undershooting and picking up random items on sink. Unsure of this is related to cognition vs visual impairment. Tried asking patient if near vs far sighted and states he wears glasses to "sign papers." Could not find glasses in room. Patient also needs significant time for all mobility taking very small tentative steps with cues to try and increase step length. Continue to recommend rehab at D/C as patient is unsafe/fall risk.    Recommendations for follow up therapy are one component of a multi-disciplinary discharge planning process, led by the attending physician.  Recommendations may be updated based on patient status, additional functional criteria and insurance authorization.    Follow Up Recommendations  SNF    Equipment Recommendations  Other (comment) (defer next venue)       Precautions / Restrictions Precautions Precautions: Fall       Mobility Bed Mobility               General bed mobility comments: seated EOB upon arrival    Transfers Overall transfer level: Needs assistance Equipment used: Rolling walker (2 wheeled) Transfers: Sit to/from Stand Sit to Stand: Min assist         General transfer comment: Multimodal cues to problem solve side stepping from sink to recliner chair with rolling walker.    Balance Overall balance assessment: Needs assistance Sitting-balance  support: Feet supported Sitting balance-Leahy Scale: Fair     Standing balance support: No upper extremity supported Standing balance-Leahy Scale: Fair Standing balance comment: Static standing at sink no loss of balance noted without UE support                           ADL either performed or assessed with clinical judgement   ADL Overall ADL's : Needs assistance/impaired     Grooming: Wash/dry face;Wash/dry hands;Minimal assistance;Standing Grooming Details (indicate cue type and reason): min A due to poor problem solving and likely visual deficits                 Toilet Transfer: Minimal assistance;Cueing for safety;Cueing for sequencing;RW Toilet Transfer Details (indicate cue type and reason): To recliner chair, max multimodal cues to sequence side stepping with walker from sink to recliner.         Functional mobility during ADLs: Minimal assistance;Rolling walker;Cueing for sequencing;Cueing for safety       Vision   Additional Comments: Held up fingers in front of patient approximately 50ft away and gave inaccurate answer. Stepped foot closer and patient able to self correct. Tried asking patient if he is near vs far sighted, ultimately states puts his glasses on to "sign papers" Did not see glasses in patient's room          Cognition Arousal/Alertness: Awake/alert Behavior During Therapy: Flat affect Overall Cognitive Status: No family/caregiver present to determine baseline cognitive functioning Area of Impairment: Following commands;Safety/judgement;Problem solving  Following Commands: Follows one step commands with increased time Safety/Judgement: Decreased awareness of deficits;Decreased awareness of safety   Problem Solving: Slow processing;Difficulty sequencing;Requires verbal cues;Requires tactile cues General Comments: Very poor problem solving needing max cues to complete hand/face hygiene task standing at  sink. Poor vision may also be at play as patient over/undershooting faucet handles, picking up other objects at sink when looking for wash cloth. States he has glasses in closet but did not see. Patient also needing max cues to problem solve side stepping around sink and then to transfer in to recliner chair with walker. Significant time needed for all tasks/mobility.                   Pertinent Vitals/ Pain       Pain Assessment: Faces Faces Pain Scale: Hurts little more Pain Location: bilateral hands Pain Descriptors / Indicators: Aching;Sore Pain Intervention(s): Monitored during session         Frequency  Min 2X/week        Progress Toward Goals  OT Goals(current goals can now be found in the care plan section)  Progress towards OT goals: Progressing toward goals  Acute Rehab OT Goals Patient Stated Goal: to go to bathroom OT Goal Formulation: With patient Time For Goal Achievement: 06/15/21 Potential to Achieve Goals: Good ADL Goals Pt Will Perform Grooming: with supervision;standing Pt Will Transfer to Toilet: with min guard assist;ambulating;bedside commode Pt Will Perform Toileting - Clothing Manipulation and hygiene: with supervision;sit to/from stand  Plan Discharge plan remains appropriate       AM-PAC OT "6 Clicks" Daily Activity     Outcome Measure   Help from another person eating meals?: A Little Help from another person taking care of personal grooming?: A Little Help from another person toileting, which includes using toliet, bedpan, or urinal?: A Lot Help from another person bathing (including washing, rinsing, drying)?: A Lot Help from another person to put on and taking off regular upper body clothing?: A Lot Help from another person to put on and taking off regular lower body clothing?: A Lot 6 Click Score: 14    End of Session Equipment Utilized During Treatment: Rolling walker  OT Visit Diagnosis: Unsteadiness on feet (R26.81);Other  symptoms and signs involving cognitive function   Activity Tolerance Patient tolerated treatment well   Patient Left in chair;with call bell/phone within reach;with chair alarm set   Nurse Communication Mobility status        Time: 5790-3833 OT Time Calculation (min): 27 min  Charges: OT General Charges $OT Visit: 1 Visit OT Treatments $Self Care/Home Management : 23-37 mins  Delbert Phenix OT OT pager: [phone]   Rosemary Holms 06/03/2021, 10:49 AM

## 2021-06-03 NOTE — Discharge Summary (Signed)
Physician Discharge Summary  [name] GYB:[id number] DOB: 1946-01-05 DOA: 05/28/2021  PCP: [name], MD  Admit date: 05/28/2021 Discharge date: 06/03/2021  Admitted From: Home  Disposition:  Home. Patient declined SNF  Recommendations for Outpatient Follow-up and new medication changes:  Follow up with Dr. Sharlet Salina in 7 to 10 days.  Continue lactulose tid, titrate to 2 to 3 bowel movements per day.  Follow up renal function as outpatient.   I spoke over the phone with the patient's wife about patient's  condition, plan of care, prognosis and all questions were addressed.   Home Health: yes   Equipment/Devices: hospital bed     Discharge Condition: stable CODE STATUS: full  Diet recommendation:  heart healthy   Brief/Interim Summary: [name] was admitted to the hospital with the working diagnosis of porto systemic hepatic encephalopathy, in the setting of hepatic cirrhosis.    75 year old male past medical history for hepatic cirrhosis, coronary disease, diastolic heart failure, type 2 diabetes mellitus and hepatocellular carcinoma who presented with altered mentation.  He was unable to give any detailed history due to cognitive impairment.  Apparently his family called EMS because of his change in mentation, persistent lethargy.  Questionable patient non compliant with his medications.  On his initial physical examination blood pressure 154/72, heart rate 98, respiratory rate 18, oxygen saturation 96%, his lungs had no wheezing or rhonchi, heart S1-S2, present, rhythmic, abdomen was soft nontender, no lower extremity edema.  Patient was somnolent, withdraws from painful stimuli, not following commands.   Sodium 139, potassium 4.5, chloride 111, bicarb 21, glucose 137, BUN 23, creatinine 1.13, alkaline phosphatase 163, AST 34, ALT 24, ammonia 203, white count 3.0, hemoglobin 7.1, hematocrit 24.1, platelets 121. SARS COVID-19 negative.   Urinalysis specific gravity  1.009, 21-50 white cells.  6-10 red cells. Toxicology negative, alcohol less than 10.   Head CT no acute changes.   Chest radiograph with cardiomegaly, hilar vascular congestion.   EKG 73 bpm, normal axis, normal QTC, atrial fibrillation rhythm, low voltage, no significant ST segment or T wave changes.  Poor R wave progression.   Patient was placed on lactulose, initially rectally and then transition to by mouth with good toleration.   Patient very weak and deconditioned, pending transfer to skilled nursing facility.  Patient declined SNF, he will be dc home with home health services.  Needs close follow up as outpatient and encourage medication compliance.    Hepatic portosystemic encephalopathy, acute on chronic liver failure, hepatic cirrhosis. Patient was admitted to the telemetry ward, he received aggressive lactulose therapy initially and 10 minutes and then transition to oral with good toleration.  Clinically he is more awake and alert but condition to be very weak and deconditioned. He declined going to a skilled nursing facility. Patient continue taking lactulose 45 g 3 times daily, titrate to 2-3 bowel movements per day. Continue spironolactone and furosemide.  2.  Iron deficiency anemia.  Continue with oral iron supplementation.  3.  Obesity class I.  Obstructive sleep apnea.  His calculated BMI is 32.7. CPAP as an outpatient.  4.  Type 2 diabetes mellitus.  His glucose remained stable during hospitalization, insulin coverage was discontinued. Home patient not taking metformin anymore.   Discharge Diagnoses:  Principal Problem:   Hepatic encephalopathy Active Problems:   Type 2 diabetes with complication (HCC)   GERD without esophagitis   OSA (obstructive sleep apnea)   Hepatic cirrhosis (HCC)   Obesity (BMI 30.0-34.9)   Iron  deficiency anemia    Discharge Instructions  Discharge Instructions     Diet - low sodium heart healthy   Complete by: As directed     Increase activity slowly   Complete by: As directed       Allergies as of 06/03/2021       Reactions   Fluzone Quadrivalent [influenza Vac Split Quad] Other (See Comments)   The patient stated, in 10/2020: "I am not taking any more flu shots. It liked to have killed me."        Medication List     STOP taking these medications    metFORMIN 850 MG tablet Commonly known as: GLUCOPHAGE   nitroGLYCERIN 0.4 MG SL tablet Commonly known as: NITROSTAT       TAKE these medications    Accu-Chek Softclix Lancets lancets USE AS DIRECTED TO TEST BLOOD SUGAR FOUR TIMES DAILY   albuterol 108 (90 Base) MCG/ACT inhaler Commonly known as: VENTOLIN HFA Inhale 2 puffs into the lungs every 6 (six) hours as needed for wheezing or shortness of breath.   blood glucose meter kit and supplies Dispense based on patient and insurance preference. Use up to four times daily as directed. (FOR ICD-10 E10.9, E11.9).   feeding supplement Liqd Take 237 mLs by mouth 2 (two) times daily between meals.   ferrous sulfate 325 (65 FE) MG tablet Take 1 tablet (325 mg total) by mouth daily with breakfast.   furosemide 40 MG tablet Commonly known as: LASIX Take 1 tablet (40 mg total) by mouth daily.   lactulose 10 GM/15ML solution Commonly known as: CHRONULAC Take 45 mLs (30 g total) by mouth 3 (three) times daily.   multivitamin with minerals Tabs tablet Take 1 tablet by mouth daily. Start taking on: June 04, 2021   pantoprazole 40 MG tablet Commonly known as: PROTONIX Take 1 tablet (40 mg total) by mouth daily. What changed: when to take this   spironolactone 50 MG tablet Commonly known as: ALDACTONE Take 1 tablet (50 mg total) by mouth daily.               Durable Medical Equipment  (From admission, onward)           Start     Ordered   06/01/21 1541  For home use only DME Hospital bed  Once       Question Answer Comment  Length of Need 6 Months   Head must be  elevated greater than: 45 degrees   Bed type Semi-electric      06/01/21 1540            Allergies  Allergen Reactions   Fluzone Quadrivalent [Influenza Vac Split Quad] Other (See Comments)    The patient stated, in 10/2020: "I am not taking any more flu shots. It liked to have killed me."     Subjective: Patient is feeling better, ha has been out of bed with PT and OT, no nausea or vomiting.   Discharge Exam: Vitals:   06/02/21 1922 06/03/21 0445  BP: (!) 132/57 129/60  Pulse: 72 65  Resp: 16 16  Temp: 98.6 F (37 C) 98.3 F (36.8 C)  SpO2: 98% 97%   Vitals:   06/02/21 0402 06/02/21 0447 06/02/21 1922 06/03/21 0445  BP: 95/67  (!) 132/57 129/60  Pulse: 62  72 65  Resp: _0 Temp: 98.1 F (36.7 C)  98.6 F (37 C) 98.3 F (36.8 C)  TempSrc: Oral  SpO2: 97%  98% 97%  Weight:  90.1 kg    Height:        General: Not in pain or dyspnea./  Neurology: Awake and alert, non focal  E ENT: no pallor, no icterus, oral mucosa moist Cardiovascular: No JVD. S1-S2 present, rhythmic, no gallops, rubs, or murmurs. ++ non pitting bilateral lower extremity edema. Pulmonary Positive breath sounds bilaterally, adequate air movement, no wheezing, rhonchi or rales. Gastrointestinal. Abdomen soft and non tender Skin. No rashes Musculoskeletal: no joint deformities   The results of significant diagnostics from this hospitalization (including imaging, microbiology, ancillary and laboratory) are listed below for reference.     Microbiology: Recent Results (from the past 240 hour(s))  Resp Panel by RT-PCR (Flu A&B, Covid) Nasopharyngeal Swab     Status: None   Collection Time: 05/28/21  5:02 AM   Specimen: Nasopharyngeal Swab; Nasopharyngeal(NP) swabs in vial transport medium  Result Value Ref Range Status   SARS Coronavirus 2 by RT PCR NEGATIVE NEGATIVE Final    Comment: (NOTE) SARS-CoV-2 target nucleic acids are NOT DETECTED.  The SARS-CoV-2 RNA is generally  detectable in upper respiratory specimens during the acute phase of infection. The lowest concentration of SARS-CoV-2 viral copies this assay can detect is 138 copies/mL. A negative result does not preclude SARS-Cov-2 infection and should not be used as the sole basis for treatment or other patient management decisions. A negative result may occur with  improper specimen collection/handling, submission of specimen other than nasopharyngeal swab, presence of viral mutation(s) within the areas targeted by this assay, and inadequate number of viral copies(<138 copies/mL). A negative result must be combined with clinical observations, patient history, and epidemiological information. The expected result is Negative.  Fact Sheet for Patients:  EntrepreneurPulse.com.au  Fact Sheet for Healthcare Providers:  IncredibleEmployment.be  This test is no t yet approved or cleared by the [place] FDA and  has been authorized for detection and/or diagnosis of SARS-CoV-2 by FDA under an Emergency Use Authorization (EUA). This EUA will remain  in effect (meaning this test can be used) for the duration of the COVID-19 declaration under Section 564(b)(1) of the Act, 21 U.S.C.section 360bbb-3(b)(1), unless the authorization is terminated  or revoked sooner.       Influenza A by PCR NEGATIVE NEGATIVE Final   Influenza B by PCR NEGATIVE NEGATIVE Final    Comment: (NOTE) The Xpert Xpress SARS-CoV-2/FLU/RSV plus assay is intended as an aid in the diagnosis of influenza from Nasopharyngeal swab specimens and should not be used as a sole basis for treatment. Nasal washings and aspirates are unacceptable for Xpert Xpress SARS-CoV-2/FLU/RSV testing.  Fact Sheet for Patients: EntrepreneurPulse.com.au  Fact Sheet for Healthcare Providers: IncredibleEmployment.be  This test is not yet approved or cleared by the [place] FDA  and has been authorized for detection and/or diagnosis of SARS-CoV-2 by FDA under an Emergency Use Authorization (EUA). This EUA will remain in effect (meaning this test can be used) for the duration of the COVID-19 declaration under Section 564(b)(1) of the Act, 21 U.S.C. section 360bbb-3(b)(1), unless the authorization is terminated or revoked.  Performed at [facility], [place] [address] [place], [place] [address]      Labs: BNP (last 3 results) Recent Labs    03/04/21 1310 04/28/21 1843 05/28/21 0125  BNP 432.3* 361.3* 595.6*   Basic Metabolic Panel: Recent Labs  Lab 05/30/21 0533 05/31/21 0448 06/01/21 0502 06/02/21 0500 06/03/21 0516  NA 138 132* 139 137 136  K 3.6 4.1 4.3 3.9 4.1  CL 110 105 110 105 109  CO2 _0 GLUCOSE 125* 97 113* 101* 128*  BUN _1 CREATININE 0.79 1.07 1.07 0.86 0.97  CALCIUM 8.5* 8.2* 9.0 8.8* 8.8*  MG 1.9 1.9  --   --   --    Liver Function Tests: Recent Labs  Lab 05/28/21 0125 05/29/21 0544 05/30/21 0533 05/31/21 0448  AST 34 34 37 36  ALT _2 ALKPHOS 163* 125 142* 138*  BILITOT 0.8 1.2 1.1 1.0  PROT 6.9 5.9* 5.8* 5.8*  ALBUMIN 3.3* 2.7* 2.7* 2.7*   No results for input(s): LIPASE, AMYLASE in the last 168 hours. Recent Labs  Lab 05/28/21 0125 05/30/21 0533 06/02/21 0500  AMMONIA 203* 104* 121*   CBC: Recent Labs  Lab 05/28/21 0125 05/29/21 0544 05/29/21 1707 05/30/21 0533 05/31/21 0448  WBC 3.0* 3.1*  --  3.1* 3.0*  NEUTROABS 2.4  --   --  2.2 2.3  HGB 7.1* 6.7* 8.1* 7.4* 7.3*  HCT 24.1* 22.6* 27.0* 24.5* 24.1*  MCV 87.3 85.9  --  85.4 84.6  PLT 121* 115*  --  91* 91*   Cardiac Enzymes: No results for input(s): CKTOTAL, CKMB, CKMBINDEX, TROPONINI in the last 168 hours. BNP: Invalid input(s): POCBNP CBG: Recent Labs  Lab 05/31/21 1724 05/31/21 2114 06/01/21 0800 06/01/21 1215 06/01/21 1611  GLUCAP 186* 137* 101* 117* 104*   D-Dimer No results for  input(s): DDIMER in the last 72 hours. Hgb A1c No results for input(s): HGBA1C in the last 72 hours. Lipid Profile No results for input(s): CHOL, HDL, LDLCALC, TRIG, CHOLHDL, LDLDIRECT in the last 72 hours. Thyroid function studies No results for input(s): TSH, T4TOTAL, T3FREE, THYROIDAB in the last 72 hours.  Invalid input(s): FREET3 Anemia work up No results for input(s): VITAMINB12, FOLATE, FERRITIN, TIBC, IRON, RETICCTPCT in the last 72 hours. Urinalysis    Component Value Date/Time   COLORURINE YELLOW 05/28/2021 0632   APPEARANCEUR CLEAR 05/28/2021 0632   LABSPEC 1.009 05/28/2021 0632   PHURINE 7.0 05/28/2021 0632   GLUCOSEU NEGATIVE 05/28/2021 0632   HGBUR SMALL (A) 05/28/2021 0632   BILIRUBINUR NEGATIVE 05/28/2021 0632   KETONESUR NEGATIVE 05/28/2021 0632   PROTEINUR NEGATIVE 05/28/2021 0632   UROBILINOGEN 0.2 03/29/2014 1546   NITRITE NEGATIVE 05/28/2021 0632   LEUKOCYTESUR TRACE (A) 05/28/2021 6244   Sepsis Labs Invalid input(s): PROCALCITONIN,  WBC,  LACTICIDVEN Microbiology Recent Results (from the past 240 hour(s))  Resp Panel by RT-PCR (Flu A&B, Covid) Nasopharyngeal Swab     Status: None   Collection Time: 05/28/21  5:02 AM   Specimen: Nasopharyngeal Swab; Nasopharyngeal(NP) swabs in vial transport medium  Result Value Ref Range Status   SARS Coronavirus 2 by RT PCR NEGATIVE NEGATIVE Final    Comment: (NOTE) SARS-CoV-2 target nucleic acids are NOT DETECTED.  The SARS-CoV-2 RNA is generally detectable in upper respiratory specimens during the acute phase of infection. The lowest concentration of SARS-CoV-2 viral copies this assay can detect is 138 copies/mL. A negative result does not preclude SARS-Cov-2 infection and should not be used as the sole basis for treatment or other patient management decisions. A negative result may occur with  improper specimen collection/handling, submission of specimen other than nasopharyngeal swab, presence of viral  mutation(s) within the areas targeted by this assay, and inadequate number of viral copies(<138 copies/mL). A negative result must be combined with clinical observations, patient  history, and epidemiological information. The expected result is Negative.  Fact Sheet for Patients:  EntrepreneurPulse.com.au  Fact Sheet for Healthcare Providers:  IncredibleEmployment.be  This test is no t yet approved or cleared by the [place] FDA and  has been authorized for detection and/or diagnosis of SARS-CoV-2 by FDA under an Emergency Use Authorization (EUA). This EUA will remain  in effect (meaning this test can be used) for the duration of the COVID-19 declaration under Section 564(b)(1) of the Act, 21 U.S.C.section 360bbb-3(b)(1), unless the authorization is terminated  or revoked sooner.       Influenza A by PCR NEGATIVE NEGATIVE Final   Influenza B by PCR NEGATIVE NEGATIVE Final    Comment: (NOTE) The Xpert Xpress SARS-CoV-2/FLU/RSV plus assay is intended as an aid in the diagnosis of influenza from Nasopharyngeal swab specimens and should not be used as a sole basis for treatment. Nasal washings and aspirates are unacceptable for Xpert Xpress SARS-CoV-2/FLU/RSV testing.  Fact Sheet for Patients: EntrepreneurPulse.com.au  Fact Sheet for Healthcare Providers: IncredibleEmployment.be  This test is not yet approved or cleared by the [place] FDA and has been authorized for detection and/or diagnosis of SARS-CoV-2 by FDA under an Emergency Use Authorization (EUA). This EUA will remain in effect (meaning this test can be used) for the duration of the COVID-19 declaration under Section 564(b)(1) of the Act, 21 U.S.C. section 360bbb-3(b)(1), unless the authorization is terminated or revoked.  Performed at [facility], [place] [address], [place], [place] [address]      Time coordinating  discharge: 45 minutes  SIGNED:   Tawni Millers, MD  [facility] 06/03/2021, 11:34 AM

## 2021-06-03 NOTE — Plan of Care (Signed)
  Problem: Activity: Goal: Risk for activity intolerance will decrease Outcome: Progressing   Problem: Elimination: Goal: Will not experience complications related to bowel motility Outcome: Progressing   Problem: Pain Managment: Goal: General experience of comfort will improve Outcome: Progressing   

## 2021-06-08 ENCOUNTER — Telehealth: Payer: Self-pay | Admitting: Internal Medicine

## 2021-06-08 DIAGNOSIS — E785 Hyperlipidemia, unspecified: Secondary | ICD-10-CM | POA: Diagnosis not present

## 2021-06-08 DIAGNOSIS — J4520 Mild intermittent asthma, uncomplicated: Secondary | ICD-10-CM | POA: Diagnosis not present

## 2021-06-08 DIAGNOSIS — I110 Hypertensive heart disease with heart failure: Secondary | ICD-10-CM | POA: Diagnosis not present

## 2021-06-08 DIAGNOSIS — J449 Chronic obstructive pulmonary disease, unspecified: Secondary | ICD-10-CM | POA: Diagnosis not present

## 2021-06-08 DIAGNOSIS — R69 Illness, unspecified: Secondary | ICD-10-CM | POA: Diagnosis not present

## 2021-06-08 DIAGNOSIS — K7682 Hepatic encephalopathy: Secondary | ICD-10-CM | POA: Diagnosis not present

## 2021-06-08 DIAGNOSIS — I272 Pulmonary hypertension, unspecified: Secondary | ICD-10-CM | POA: Diagnosis not present

## 2021-06-08 DIAGNOSIS — K922 Gastrointestinal hemorrhage, unspecified: Secondary | ICD-10-CM | POA: Diagnosis not present

## 2021-06-08 DIAGNOSIS — K219 Gastro-esophageal reflux disease without esophagitis: Secondary | ICD-10-CM | POA: Diagnosis not present

## 2021-06-08 DIAGNOSIS — I2510 Atherosclerotic heart disease of native coronary artery without angina pectoris: Secondary | ICD-10-CM | POA: Diagnosis not present

## 2021-06-08 DIAGNOSIS — M47819 Spondylosis without myelopathy or radiculopathy, site unspecified: Secondary | ICD-10-CM | POA: Diagnosis not present

## 2021-06-08 DIAGNOSIS — G8929 Other chronic pain: Secondary | ICD-10-CM | POA: Diagnosis not present

## 2021-06-08 DIAGNOSIS — I5032 Chronic diastolic (congestive) heart failure: Secondary | ICD-10-CM | POA: Diagnosis not present

## 2021-06-08 DIAGNOSIS — D696 Thrombocytopenia, unspecified: Secondary | ICD-10-CM | POA: Diagnosis not present

## 2021-06-08 DIAGNOSIS — M109 Gout, unspecified: Secondary | ICD-10-CM | POA: Diagnosis not present

## 2021-06-08 DIAGNOSIS — D61818 Other pancytopenia: Secondary | ICD-10-CM | POA: Diagnosis not present

## 2021-06-08 DIAGNOSIS — K279 Peptic ulcer, site unspecified, unspecified as acute or chronic, without hemorrhage or perforation: Secondary | ICD-10-CM | POA: Diagnosis not present

## 2021-06-08 DIAGNOSIS — G4733 Obstructive sleep apnea (adult) (pediatric): Secondary | ICD-10-CM | POA: Diagnosis not present

## 2021-06-08 DIAGNOSIS — M1990 Unspecified osteoarthritis, unspecified site: Secondary | ICD-10-CM | POA: Diagnosis not present

## 2021-06-08 DIAGNOSIS — H538 Other visual disturbances: Secondary | ICD-10-CM | POA: Diagnosis not present

## 2021-06-08 DIAGNOSIS — R32 Unspecified urinary incontinence: Secondary | ICD-10-CM | POA: Diagnosis not present

## 2021-06-08 DIAGNOSIS — E669 Obesity, unspecified: Secondary | ICD-10-CM | POA: Diagnosis not present

## 2021-06-08 DIAGNOSIS — E119 Type 2 diabetes mellitus without complications: Secondary | ICD-10-CM | POA: Diagnosis not present

## 2021-06-08 DIAGNOSIS — D509 Iron deficiency anemia, unspecified: Secondary | ICD-10-CM | POA: Diagnosis not present

## 2021-06-08 DIAGNOSIS — K7031 Alcoholic cirrhosis of liver with ascites: Secondary | ICD-10-CM | POA: Diagnosis not present

## 2021-06-08 NOTE — Telephone Encounter (Signed)
See below

## 2021-06-08 NOTE — Telephone Encounter (Signed)
Okay for verbals °

## 2021-06-08 NOTE — Telephone Encounter (Signed)
[name], a PT from [facility] called to request verbal order to continue PT with pt. States that he saw pt. For admission evaluation. Would like a return call to confirm orders, or for ay additional questions. States to LVM, if unavailable.     Contact #- [phone]

## 2021-06-09 NOTE — Telephone Encounter (Signed)
Verbal orders given  

## 2021-06-11 ENCOUNTER — Telehealth: Payer: Self-pay | Admitting: Cardiovascular Disease

## 2021-06-11 NOTE — Telephone Encounter (Signed)
Left message for patient to call back  

## 2021-06-11 NOTE — Telephone Encounter (Signed)
Patient's son states the patient was recently hospitalized and advised to follow up with cardiology. Patient has an appointment currently scheduled for 07/26/21 with Dr. Acie Fredrickson, but he would like to know if it can be moved up. Please advise.

## 2021-06-15 ENCOUNTER — Encounter (HOSPITAL_COMMUNITY): Payer: Self-pay | Admitting: Pharmacy Technician

## 2021-06-15 ENCOUNTER — Ambulatory Visit: Payer: Medicare HMO | Admitting: Internal Medicine

## 2021-06-15 ENCOUNTER — Other Ambulatory Visit: Payer: Self-pay

## 2021-06-15 ENCOUNTER — Observation Stay (HOSPITAL_COMMUNITY)
Admission: EM | Admit: 2021-06-15 | Discharge: 2021-06-18 | Disposition: A | Discharge status: Home / Self Care | Payer: Medicare HMO | Attending: Internal Medicine | Admitting: Internal Medicine

## 2021-06-15 DIAGNOSIS — I10 Essential (primary) hypertension: Secondary | ICD-10-CM | POA: Insufficient documentation

## 2021-06-15 DIAGNOSIS — E119 Type 2 diabetes mellitus without complications: Secondary | ICD-10-CM | POA: Insufficient documentation

## 2021-06-15 DIAGNOSIS — R69 Illness, unspecified: Secondary | ICD-10-CM | POA: Diagnosis not present

## 2021-06-15 DIAGNOSIS — K922 Gastrointestinal hemorrhage, unspecified: Secondary | ICD-10-CM | POA: Diagnosis present

## 2021-06-15 DIAGNOSIS — D500 Iron deficiency anemia secondary to blood loss (chronic): Secondary | ICD-10-CM | POA: Diagnosis not present

## 2021-06-15 DIAGNOSIS — Z951 Presence of aortocoronary bypass graft: Secondary | ICD-10-CM | POA: Insufficient documentation

## 2021-06-15 DIAGNOSIS — G9340 Encephalopathy, unspecified: Secondary | ICD-10-CM

## 2021-06-15 DIAGNOSIS — K7290 Hepatic failure, unspecified without coma: Secondary | ICD-10-CM | POA: Diagnosis not present

## 2021-06-15 DIAGNOSIS — R2689 Other abnormalities of gait and mobility: Secondary | ICD-10-CM | POA: Insufficient documentation

## 2021-06-15 DIAGNOSIS — E118 Type 2 diabetes mellitus with unspecified complications: Secondary | ICD-10-CM | POA: Diagnosis not present

## 2021-06-15 DIAGNOSIS — K7682 Hepatic encephalopathy: Secondary | ICD-10-CM | POA: Diagnosis not present

## 2021-06-15 DIAGNOSIS — Z20822 Contact with and (suspected) exposure to covid-19: Secondary | ICD-10-CM | POA: Diagnosis not present

## 2021-06-15 DIAGNOSIS — N179 Acute kidney failure, unspecified: Secondary | ICD-10-CM | POA: Insufficient documentation

## 2021-06-15 DIAGNOSIS — Z87891 Personal history of nicotine dependence: Secondary | ICD-10-CM | POA: Insufficient documentation

## 2021-06-15 DIAGNOSIS — Z79899 Other long term (current) drug therapy: Secondary | ICD-10-CM | POA: Diagnosis not present

## 2021-06-15 DIAGNOSIS — C220 Liver cell carcinoma: Secondary | ICD-10-CM | POA: Diagnosis not present

## 2021-06-15 DIAGNOSIS — K7031 Alcoholic cirrhosis of liver with ascites: Secondary | ICD-10-CM | POA: Diagnosis not present

## 2021-06-15 DIAGNOSIS — Z8505 Personal history of malignant neoplasm of liver: Secondary | ICD-10-CM | POA: Insufficient documentation

## 2021-06-15 DIAGNOSIS — R531 Weakness: Secondary | ICD-10-CM | POA: Diagnosis present

## 2021-06-15 LAB — COMPREHENSIVE METABOLIC PANEL
ALT: 40 U/L (ref 0–44)
AST: 57 U/L — ABNORMAL HIGH (ref 15–41)
Albumin: 3.2 g/dL — ABNORMAL LOW (ref 3.5–5.0)
Alkaline Phosphatase: 349 U/L — ABNORMAL HIGH (ref 38–126)
Anion gap: 12 (ref 5–15)
BUN: 42 mg/dL — ABNORMAL HIGH (ref 8–23)
CO2: 21 mmol/L — ABNORMAL LOW (ref 22–32)
Calcium: 9.6 mg/dL (ref 8.9–10.3)
Chloride: 108 mmol/L (ref 98–111)
Creatinine, Ser: 1.88 mg/dL — ABNORMAL HIGH (ref 0.61–1.24)
GFR, Estimated: 37 mL/min — ABNORMAL LOW (ref >60)
Glucose, Bld: 144 mg/dL — ABNORMAL HIGH (ref 70–99)
Potassium: 3.9 mmol/L (ref 3.5–5.1)
Sodium: 141 mmol/L (ref 135–145)
Total Bilirubin: 1.5 mg/dL — ABNORMAL HIGH (ref 0.3–1.2)
Total Protein: 7.6 g/dL (ref 6.5–8.1)

## 2021-06-15 LAB — AMMONIA: Ammonia: 81 umol/L — ABNORMAL HIGH (ref 9–35)

## 2021-06-15 LAB — CBC WITH DIFFERENTIAL/PLATELET
Abs Immature Granulocytes: 0.02 10*3/uL (ref 0.00–0.07)
Basophils Absolute: 0 10*3/uL (ref 0.0–0.1)
Basophils Relative: 0 %
Eosinophils Absolute: 0.1 10*3/uL (ref 0.0–0.5)
Eosinophils Relative: 2 %
HCT: 39.7 % (ref 39.0–52.0)
Hemoglobin: 11.6 g/dL — ABNORMAL LOW (ref 13.0–17.0)
Immature Granulocytes: 0 %
Lymphocytes Relative: 12 %
Lymphs Abs: 0.7 10*3/uL (ref 0.7–4.0)
MCH: 25.6 pg — ABNORMAL LOW (ref 26.0–34.0)
MCHC: 29.2 g/dL — ABNORMAL LOW (ref 30.0–36.0)
MCV: 87.4 fL (ref 80.0–100.0)
Monocytes Absolute: 0.6 10*3/uL (ref 0.1–1.0)
Monocytes Relative: 10 %
Neutro Abs: 4.3 10*3/uL (ref 1.7–7.7)
Neutrophils Relative %: 76 %
Platelets: 186 10*3/uL (ref 150–400)
RBC: 4.54 MIL/uL (ref 4.22–5.81)
RDW: 20.3 % — ABNORMAL HIGH (ref 11.5–15.5)
WBC: 5.8 10*3/uL (ref 4.0–10.5)
nRBC: 0 % (ref 0.0–0.2)

## 2021-06-15 LAB — TYPE AND SCREEN
ABO/RH(D): O POS
Antibody Screen: NEGATIVE

## 2021-06-15 LAB — CBG MONITORING, ED
Glucose-Capillary: 119 mg/dL — ABNORMAL HIGH (ref 70–99)
Glucose-Capillary: 173 mg/dL — ABNORMAL HIGH (ref 70–99)

## 2021-06-15 LAB — PROTIME-INR
INR: 1.3 — ABNORMAL HIGH (ref 0.8–1.2)
Prothrombin Time: 16.5 seconds — ABNORMAL HIGH (ref 11.4–15.2)

## 2021-06-15 MED ORDER — SODIUM CHLORIDE 0.9 % IV BOLUS
1000.0000 mL | Freq: Once | INTRAVENOUS | Status: AC
  Administered 2021-06-15: 1000 mL via INTRAVENOUS

## 2021-06-15 MED ORDER — UMECLIDINIUM-VILANTEROL 62.5-25 MCG/ACT IN AEPB
1.0000 | INHALATION_SPRAY | Freq: Every day | RESPIRATORY_TRACT | Status: DC
  Administered 2021-06-17 – 2021-06-18 (×2): 1 via RESPIRATORY_TRACT
  Filled 2021-06-15 (×2): qty 14

## 2021-06-15 MED ORDER — ONDANSETRON HCL 4 MG/2ML IJ SOLN: 4.0000 mg | Freq: Four times a day (QID) | INTRAMUSCULAR | Status: DC | PRN

## 2021-06-15 MED ORDER — LACTULOSE 10 GM/15ML PO SOLN
30.0000 g | Freq: Once | ORAL | Status: AC
  Administered 2021-06-15: 30 g via ORAL
  Filled 2021-06-15: qty 45

## 2021-06-15 MED ORDER — ALBUTEROL SULFATE (2.5 MG/3ML) 0.083% IN NEBU: 3.0000 mL | INHALATION_SOLUTION | Freq: Four times a day (QID) | RESPIRATORY_TRACT | Status: DC | PRN

## 2021-06-15 MED ORDER — ONDANSETRON HCL 4 MG PO TABS: 4.0000 mg | ORAL_TABLET | Freq: Four times a day (QID) | ORAL | Status: DC | PRN

## 2021-06-15 MED ORDER — PANTOPRAZOLE SODIUM 40 MG PO TBEC
40.0000 mg | DELAYED_RELEASE_TABLET | Freq: Two times a day (BID) | ORAL | Status: DC
  Administered 2021-06-16 – 2021-06-18 (×5): 40 mg via ORAL
  Filled 2021-06-15 (×5): qty 1

## 2021-06-15 MED ORDER — LACTATED RINGERS IV SOLN: INTRAVENOUS | Status: DC

## 2021-06-15 MED ORDER — INSULIN ASPART 100 UNIT/ML IJ SOLN
0.0000 [IU] | Freq: Three times a day (TID) | INTRAMUSCULAR | Status: DC
  Administered 2021-06-16: 1 [IU] via SUBCUTANEOUS

## 2021-06-15 MED ORDER — LACTULOSE 10 GM/15ML PO SOLN
30.0000 g | Freq: Three times a day (TID) | ORAL | Status: DC
  Administered 2021-06-16 – 2021-06-18 (×7): 30 g via ORAL
  Filled 2021-06-15 (×7): qty 45

## 2021-06-15 MED ORDER — RIFAXIMIN 550 MG PO TABS
550.0000 mg | ORAL_TABLET | Freq: Two times a day (BID) | ORAL | Status: DC
  Administered 2021-06-15 – 2021-06-18 (×6): 550 mg via ORAL
  Filled 2021-06-15 (×7): qty 1

## 2021-06-15 MED ORDER — FERROUS SULFATE 325 (65 FE) MG PO TABS
325.0000 mg | ORAL_TABLET | Freq: Every day | ORAL | Status: DC
  Administered 2021-06-16 – 2021-06-18 (×3): 325 mg via ORAL
  Filled 2021-06-15 (×3): qty 1

## 2021-06-15 NOTE — Telephone Encounter (Signed)
Reviewing chart in order to attempt contact again.  Pt currently at the ER w weakness and a fall earlier today that he does not remember.

## 2021-06-15 NOTE — ED Triage Notes (Signed)
Pt here for evaluation of possible fall today. Pt states his wife said he fell but he does not remember it. Pt appears weak in triage. Pt also with possible blood in his stool.

## 2021-06-15 NOTE — ED Notes (Signed)
Lactolos requested from pharmacy

## 2021-06-15 NOTE — ED Provider Notes (Signed)
Emergency Medicine Provider Triage Evaluation Note  [name] , a 75 y.o. male  was evaluated in triage.  Pt encouraged to come to the hospital by his wife after reportedly falling out of the bed.  She is also noted that he has blood in his stool.  Patient with a history of liver cirrhosis.  Also with multiple blood transfusions in the past.  Most recent blood transfusion on October 7.  Review of Systems  Positive: Patient with no complaints Negative: Chest pain, shortness of breath, lightheadedness, melena  Physical Exam  BP 132/70 (BP Location: Right Arm)   Pulse 87   Temp 98.1 F (36.7 C)   Resp 15   SpO2 100%  Gen:   Awake, no distress   Resp:  Normal effort  MSK:   Moves extremities without difficulty  Other:  Patient is ill-appearing.  Jaundiced and unkempt  Medical Decision Making  Medically screening exam initiated at 2:45 PM.  Appropriate orders placed.  [name] was informed that the remainder of the evaluation will be completed by another provider, this initial triage assessment does not replace that evaluation, and the importance of remaining in the ED until their evaluation is complete.     Darliss Ridgel 06/15/21 1446    Carmin Muskrat, MD 06/15/21 2003

## 2021-06-15 NOTE — ED Provider Notes (Signed)
[facility] EMERGENCY DEPARTMENT Provider Note   CSN: [id number] Arrival date & time: 06/15/21  1312     History Chief Complaint  Patient presents with   Fall   Weakness    [name] is a 75 y.o. male.  HPI Patient presents with concern of weakness.  Patient denies pain, cannot actually specify when the weakness began.  Reportedly his wife was concerned that he fell from the bed, but the patient was unsure that this actually occurred, rather states that he has been progressively weaker over the past few days at least.  He is slow to answer questions, cannot provide details of recent medication change, diet, activity or any obvious precipitating, alleviating or exacerbating factors.      He states that during this illness, he has had minimal oral intake. Acknowledges multiple medical issues including hepatocellular carcinoma, CAD, diabetes.  Additional details via chart review including discharge summary from 12 days ago.  Patient presented at that point for altered mental status.  He seemingly improved with regular lactulose, fluids.  Of a 5 caveat secondary to acuity of condition/confusion  CT 6/22: IMPRESSION: 1. Cirrhotic morphology of the liver with interval placement of right hepatic vein TIPS and coiling of gastric varices. 2. There is a very redundant sigmoid colon looped into the right lower quadrant, with diffuse, long segment wall thickening of the descending colon, sigmoid colon, and rectum. Findings are consistent with nonspecific infectious, inflammatory, or ischemic colitis. 3. Near complete interval resolution of ascites. 4. Subcapsular hypodense lesion of the peripheral liver dome, hepatic segment VII, presumably a treated hepatocellular carcinoma, incompletely characterized and assessed by noncontrast CT. 5. Nonobstructive right nephrolithiasis. 6. Coronary artery disease.   Aortic Atherosclerosis (ICD10-I70.0).     Electronically  Signed   By: Eddie Candle M.D.   On: 01/29/2021 20:07     Past Medical History:  Diagnosis Date   Arthritis    "joints tighten up sometimes" (03/27/2104)   Carcinoma of liver, hepatocellular ([place]) 06/30/2020   Chronic lower back pain    Coronary artery disease involving native coronary artery of native heart without angina pectoris    Severe left main disease at catheterization July 2015  CABG x3 with a LIMA to the LAD, SVG to the OM, SVG to the PDA on 03/31/14. EF 60% by cath.    Diastolic heart failure (HCC)    GERD without esophagitis 08/04/2010   Hepatic cirrhosis ([place])    a. Dx 01/2014 - CT a/p    History of blood transfusion    "related to bleeding ulcers"   History of concussion    1976--  NO RESIDUAL   History of GI bleed    a. UGIB 07/2012;  b. 01/2014 admission with GIB/FOB stool req 1U prbc's->EGD showed portal gastropathy, barrett's esoph, and chronic active h. pylori gastritis.   History of gout    2007 &  2008  LEFT LEG-- NO ISSUE SINCE   Hyperlipidemia    Iron deficiency anemia    Kidney stones    OA (osteoarthritis of spine)    LOWER BACK--  INTERMITTANT LEFT LEG NUMBNESS   OSA (obstructive sleep apnea)    PULMOLOGIST-  DR CLANCE--  MODERATE OSA  STARTED CPAP 2012--  BUT CURRENTLY HAS NOT USED PAST 6 MONTHS   Phimosis    a. s/p circumcision 2015.   Type 2 diabetes mellitus ([place])    Unspecified essential hypertension     Patient Active Problem List  Diagnosis Date Noted   Acute upper GI bleed 04/29/2021   Cirrhosis ([place]) 04/29/2021   Acute on chronic anemia 04/28/2021   Hepatic encephalopathy 01/22/2021   GI bleeding 09/10/2020   Hepatocellular carcinoma ([place]) 08/09/2020   Routine general medical examination at a health care facility 05/06/2020   Iron deficiency anemia    Pancytopenia ([place]) 04/15/2020   Pulmonary hypertension, unspecified ([place]) 03/11/2020   Asthma, mild intermittent 93/71/6967   Alcoholic cirrhosis of liver with ascites ([place]) 12/27/2019    Acute on chronic diastolic CHF (congestive heart failure) ([place]) 11/01/2019   (HFpEF) heart failure with preserved ejection fraction ([place]) 12/24/2018   Peptic ulcer disease 12/18/2017   GI bleed 09/29/2017   SOB (shortness of breath) 09/21/2017   Obesity (BMI 30.0-34.9) 02/05/2016   S/P CABG x 3 03/31/2014   Hepatic cirrhosis ([place]) 02/27/2014   Melena 08/14/2012   Hyperlipidemia with target LDL less than 70 06/29/2012   Thrombocytopenia ([place]) 06/29/2012   Coronary artery disease involving native coronary artery of native heart without angina pectoris    OSA (obstructive sleep apnea)    Type 2 diabetes with complication ([place]) 89/38/1017   GERD without esophagitis 08/04/2010   History of gout    Hypertensive heart disease     Past Surgical History:  Procedure Laterality Date   ANKLE FRACTURE SURGERY Right 1989   "plate put in"   APPENDECTOMY  05-16-2004   open   BIOPSY  01/08/2021   Procedure: BIOPSY;  Surgeon: Wilford Corner, MD;  Location: [facility]OS[facility];  Service: Endoscopy;;   BIOPSY  05/02/2021   Procedure: BIOPSY;  Surgeon: Arta Silence, MD;  Location: [facility];  Service: Endoscopy;;   CIRCUMCISION N/A 09/09/2013   Procedure: CIRCUMCISION ADULT;  Surgeon: Bernestine Amass, MD;  Location: [facility];  Service: Urology;  Laterality: N/A;   COLECTOMY  05-16-2004   COLONOSCOPY N/A 05/02/2021   Procedure: COLONOSCOPY;  Surgeon: Arta Silence, MD;  Location: [facility];  Service: Endoscopy;  Laterality: N/A;   COLONOSCOPY WITH PROPOFOL N/A 11/20/2020   Procedure: COLONOSCOPY WITH PROPOFOL;  Surgeon: Ronald Lobo, MD;  Location: [facility];  Service: Endoscopy;  Laterality: N/A;   CORONARY ANGIOPLASTY WITH STENT PLACEMENT  06/28/2012  DR COOPER   PCI W/  X1 DES to [place]. LAD/  LM  40% OSTIAL & 50-60% DISTAL /  50% PROX LCX/  30-40% PROX RCA & 50% MID RCA/   LVEF 65-70%   CORONARY ARTERY BYPASS GRAFT N/A 03/31/2014   Procedure: CORONARY ARTERY BYPASS GRAFTING  (CABG) times 3 using left internal mammary artery and right saphenous vein.;  Surgeon: Melrose Nakayama, MD;  Location: [place];  Service: Open Heart Surgery;  Laterality: N/A;   DOBUTAMINE STRESS ECHO  06-08-2012   MODERATE HYPOKINESIS/ ISCHEMIA MID INFERIOR WALL   ESOPHAGOGASTRODUODENOSCOPY  08/15/2012   Procedure: ESOPHAGOGASTRODUODENOSCOPY (EGD);  Surgeon: Wonda Horner, MD;  Location: [facility] ENDOSCOPY;  Service: Endoscopy;  Laterality: N/A;   ESOPHAGOGASTRODUODENOSCOPY N/A 02/17/2014   Procedure: ESOPHAGOGASTRODUODENOSCOPY (EGD);  Surgeon: Jeryl Columbia, MD;  Location: [facility] ENDOSCOPY;  Service: Endoscopy;  Laterality: N/A;   ESOPHAGOGASTRODUODENOSCOPY N/A 04/17/2020   Procedure: ESOPHAGOGASTRODUODENOSCOPY (EGD);  Surgeon: Ronnette Juniper, MD;  Location: [place];  Service: Gastroenterology;  Laterality: N/A;   ESOPHAGOGASTRODUODENOSCOPY N/A 09/11/2020   Procedure: ESOPHAGOGASTRODUODENOSCOPY (EGD);  Surgeon: Clarene Essex, MD;  Location: [facility];  Service: Endoscopy;  Laterality: N/A;   ESOPHAGOGASTRODUODENOSCOPY  10/09/2020   ESOPHAGOGASTRODUODENOSCOPY N/A 10/09/2020   Procedure: ESOPHAGOGASTRODUODENOSCOPY (EGD);  Surgeon: Otis Brace,  MD;  Location: [place];  Service: Gastroenterology;  Laterality: N/A;   ESOPHAGOGASTRODUODENOSCOPY N/A 01/08/2021   Procedure: ESOPHAGOGASTRODUODENOSCOPY (EGD);  Surgeon: Wilford Corner, MD;  Location: [facility];  Service: Endoscopy;  Laterality: N/A;   ESOPHAGOGASTRODUODENOSCOPY N/A 05/02/2021   Procedure: ESOPHAGOGASTRODUODENOSCOPY (EGD);  Surgeon: Arta Silence, MD;  Location: [facility];  Service: Endoscopy;  Laterality: N/A;   ESOPHAGOGASTRODUODENOSCOPY (EGD) WITH PROPOFOL N/A 09/30/2017   Procedure: ESOPHAGOGASTRODUODENOSCOPY (EGD) WITH PROPOFOL;  Surgeon: Wonda Horner, MD;  Location: [facility] ENDOSCOPY;  Service: Endoscopy;  Laterality: N/A;   ESOPHAGOGASTRODUODENOSCOPY (EGD) WITH PROPOFOL N/A 12/20/2017   Procedure: ESOPHAGOGASTRODUODENOSCOPY  (EGD) WITH PROPOFOL;  Surgeon: Clarene Essex, MD;  Location: [facility];  Service: Endoscopy;  Laterality: N/A;   ESOPHAGOGASTRODUODENOSCOPY (EGD) WITH PROPOFOL N/A 08/10/2020   Procedure: ESOPHAGOGASTRODUODENOSCOPY (EGD) WITH PROPOFOL;  Surgeon: Wilford Corner, MD;  Location: [place];  Service: Endoscopy;  Laterality: N/A;   ESOPHAGOGASTRODUODENOSCOPY (EGD) WITH PROPOFOL N/A 11/20/2020   Procedure: ESOPHAGOGASTRODUODENOSCOPY (EGD) WITH PROPOFOL;  Surgeon: Ronald Lobo, MD;  [facility];  Service: Endoscopy;  Laterality: N/A;   ESOPHAGOGASTRODUODENOSCOPY (EGD) WITH PROPOFOL N/A 02/28/2021   Procedure: ESOPHAGOGASTRODUODENOSCOPY (EGD) WITH PROPOFOL;  Surgeon: Clarene Essex, MD;  [facility];  Service: Endoscopy;  Laterality: N/A;   GIVENS CAPSULE STUDY N/A 11/20/2020   Procedure: GIVENS CAPSULE STUDY;  Surgeon: Ronald Lobo, MD;  [facility];  Service: Endoscopy;  Laterality: N/A;   HEMOSTASIS CLIP PLACEMENT  05/02/2021   Procedure: HEMOSTASIS CLIP PLACEMENT;  Surgeon: Arta Silence, MD;  [facility];  Service: Endoscopy;;   HERNIA REPAIR     INTRAOPERATIVE TRANSESOPHAGEAL ECHOCARDIOGRAM N/A 03/31/2014   Procedure: INTRAOPERATIVE TRANSESOPHAGEAL ECHOCARDIOGRAM;  Surgeon: Melrose Nakayama, MD;  Location: [place];  Service: Open Heart Surgery;  Laterality: N/A;   IR ANGIOGRAM SELECTIVE EACH ADDITIONAL VESSEL  06/16/2020   IR ANGIOGRAM SELECTIVE EACH ADDITIONAL VESSEL  06/16/2020   IR ANGIOGRAM SELECTIVE EACH ADDITIONAL VESSEL  06/16/2020   IR ANGIOGRAM SELECTIVE EACH ADDITIONAL VESSEL  06/16/2020   IR ANGIOGRAM SELECTIVE EACH ADDITIONAL VESSEL  06/16/2020   IR ANGIOGRAM SELECTIVE EACH ADDITIONAL VESSEL  06/16/2020   IR ANGIOGRAM SELECTIVE EACH ADDITIONAL VESSEL  06/16/2020   IR ANGIOGRAM SELECTIVE EACH ADDITIONAL VESSEL  06/16/2020   IR ANGIOGRAM SELECTIVE EACH ADDITIONAL VESSEL  06/16/2020   IR ANGIOGRAM SELECTIVE EACH ADDITIONAL VESSEL   06/30/2020   IR ANGIOGRAM SELECTIVE EACH ADDITIONAL VESSEL  06/30/2020   IR ANGIOGRAM VISCERAL SELECTIVE  06/16/2020   IR ANGIOGRAM VISCERAL SELECTIVE  06/30/2020   IR EMBO ARTERIAL NOT HEMORR HEMANG INC GUIDE ROADMAPPING  06/16/2020   IR EMBO TUMOR ORGAN ISCHEMIA INFARCT INC GUIDE ROADMAPPING  06/30/2020   IR EMBO VENOUS NOT HEMORR HEMANG  INC GUIDE ROADMAPPING  01/13/2021   IR IVUS EACH ADDITIONAL NON CORONARY VESSEL  01/13/2021   IR RADIOLOGIST EVAL & MGMT  05/06/2020   IR RADIOLOGIST EVAL & MGMT  05/20/2020   IR RADIOLOGIST EVAL & MGMT  05/25/2021   IR TIPS  01/13/2021   IR US GUIDE VASC ACCESS RIGHT  06/16/2020   IR US GUIDE VASC ACCESS RIGHT  06/30/2020   IR US GUIDE VASC ACCESS RIGHT  01/13/2021   LAPAROSCOPIC UMBILICAL HERNIA REPAIR W/ MESH  06-06-2011   LEFT HEART CATHETERIZATION WITH CORONARY ANGIOGRAM N/A 03/28/2014   Procedure: LEFT HEART CATHETERIZATION WITH CORONARY ANGIOGRAM;  Surgeon: Sinclair Grooms, MD;  Location: [facility] CATH LAB;  Service: Cardiovascular;  Laterality: N/A;   LIPOMA EXCISION Left 08/25/2017   Procedure:  EXCISION LIPOMA LEFT POSTERIOR THIGH;  Surgeon: Clovis Riley, MD;  Location: [place];  Service: General;  Laterality: Left;   NEPHROLITHOTOMY  1990'S   OPEN APPENDECTOMY W/ PARTIAL CECECTOMY  05-16-2004   PERCUTANEOUS CORONARY STENT INTERVENTION (PCI-S) N/A 06/28/2012   Procedure: PERCUTANEOUS CORONARY STENT INTERVENTION (PCI-S);  Surgeon: Sherren Mocha, MD;  Location: [facility] CATH LAB;  Service: Cardiovascular;  Laterality: N/A;   POLYPECTOMY  11/20/2020   Procedure: POLYPECTOMY;  Surgeon: Ronald Lobo, MD;  [facility];  Service: Endoscopy;;   POLYPECTOMY  05/02/2021   Procedure: POLYPECTOMY;  Surgeon: Arta Silence, MD;  [facility];  Service: Endoscopy;;   RADIOLOGY WITH ANESTHESIA N/A 01/13/2021   Procedure: IR WITH ANESTHESIA - TIPS;  Surgeon: Suzette Battiest, MD;  Location: [place];  Service: Radiology;  Laterality: N/A;       Family  History  Problem Relation Age of Onset   Lung cancer Sister    Cancer Sister        lung   Cancer Mother    Cancer Father        died in his 46s.   Cancer Brother        lung   Coronary artery disease Other    Diabetes Other    Colon cancer Other    Cancer Sister        lung    Social History   Tobacco Use   Smoking status: Former    Packs/day: 2.00    Years: 40.00    Pack years: 80.00    Types: Cigarettes    Quit date: 08/29/1996    Years since quitting: 24.8   Smokeless tobacco: Never  Vaping Use   Vaping Use: Never used  Substance Use Topics   Alcohol use: Not Currently    Alcohol/week: 8.0 standard drinks    Types: 8 Cans of beer per week    Comment: 2 beers every other day   Drug use: No    Home Medications Prior to Admission medications   Medication Sig Start Date End Date Taking? Authorizing Provider  albuterol (VENTOLIN HFA) 108 (90 Base) MCG/ACT inhaler Inhale 2 puffs into the lungs every 6 (six) hours as needed for wheezing or shortness of breath. 03/01/21  Yes Aline August, MD  ANORO ELLIPTA 62.5-25 MCG/ACT AEPB Inhale 1 puff into the lungs daily.   Yes [provider]  ferrous sulfate 325 (65 FE) MG tablet Take 1 tablet (325 mg total) by mouth daily with breakfast. 05/06/21 08/04/21 Yes Dahal, Marlowe Aschoff, MD  furosemide (LASIX) 40 MG tablet Take 1 tablet (40 mg total) by mouth daily. 05/05/21 08/03/21 Yes Dahal, Marlowe Aschoff, MD  lactulose (CHRONULAC) 10 GM/15ML solution Take 45 mLs (30 g total) by mouth 3 (three) times daily. 03/31/21  Yes Debbe Odea, MD  pantoprazole (PROTONIX) 40 MG tablet Take 1 tablet (40 mg total) by mouth daily. Patient taking differently: Take 40 mg by mouth 2 (two) times daily before a meal. 03/31/21  Yes Rizwan, Eunice Blase, MD  spironolactone (ALDACTONE) 50 MG tablet Take 1 tablet (50 mg total) by mouth daily. 03/07/21  Yes Ghimire, Henreitta Leber, MD  Accu-Chek Softclix Lancets lancets USE AS DIRECTED TO TEST BLOOD SUGAR FOUR TIMES DAILY 03/13/20    [name], MD  blood glucose meter kit and supplies Dispense based on patient and insurance preference. Use up to four times daily as directed. (FOR ICD-10 E10.9, E11.9). 03/12/20   [name], MD  feeding supplement (ENSURE ENLIVE /  ENSURE PLUS) LIQD Take 237 mLs by mouth 2 (two) times daily between meals. Patient not taking: Reported on 06/15/2021 06/03/21 07/03/21  Arrien, Jimmy Picket, MD  Multiple Vitamin (MULTIVITAMIN WITH MINERALS) TABS tablet Take 1 tablet by mouth daily. Patient not taking: No sig reported 06/04/21 07/04/21  Arrien, Jimmy Picket, MD    Allergies    Fluzone quadrivalent [influenza vac split quad]  Review of Systems   Review of Systems  Unable to perform ROS: Acuity of condition   Physical Exam Updated Vital Signs BP (!) 126/51   Pulse 89   Temp 98.1 F (36.7 C)   Resp (!) 21   Ht _0  (1.727 m)   Wt 90.1 kg   SpO2 100%   BMI 30.20 kg/m   Physical Exam Vitals and nursing note reviewed.  Constitutional:      Appearance: He is well-developed. He is ill-appearing.  HENT:     Head: Normocephalic and atraumatic.     Comments: Very dry oral mucosa Eyes:     Conjunctiva/sclera: Conjunctivae normal.  Cardiovascular:     Rate and Rhythm: Normal rate and regular rhythm.  Pulmonary:     Effort: Pulmonary effort is normal. No respiratory distress.     Breath sounds: No stridor.  Abdominal:     General: There is no distension.  Skin:    General: Skin is warm and dry.     Coloration: Skin is pale.       Neurological:     Mental Status: He is alert.     Comments: Slow speech, does move all extremity spontaneously and to command, but slowly.  No facial asymmetry.  Psychiatric:        Behavior: Behavior is slowed and withdrawn.    ED Results / Procedures / Treatments   Labs (all labs ordered are listed, but only abnormal results are displayed) Labs Reviewed  CBC WITH DIFFERENTIAL/PLATELET - Abnormal; Notable for the following  components:      Result Value   Hemoglobin 11.6 (*)    MCH 25.6 (*)    MCHC 29.2 (*)    RDW 20.3 (*)    All other components within normal limits  COMPREHENSIVE METABOLIC PANEL - Abnormal; Notable for the following components:   CO2 21 (*)    Glucose, Bld 144 (*)    BUN 42 (*)    Creatinine, Ser 1.88 (*)    Albumin 3.2 (*)    AST 57 (*)    Alkaline Phosphatase 349 (*)    Total Bilirubin 1.5 (*)    GFR, Estimated 37 (*)    All other components within normal limits  PROTIME-INR - Abnormal; Notable for the following components:   Prothrombin Time 16.5 (*)    INR 1.3 (*)    All other components within normal limits  AMMONIA - Abnormal; Notable for the following components:   Ammonia 81 (*)    All other components within normal limits  CBG MONITORING, ED - Abnormal; Notable for the following components:   Glucose-Capillary 119 (*)    All other components within normal limits  TYPE AND SCREEN    EKG EKG Interpretation  Date/Time:  Tuesday June 15 2021 14:48:23 EDT Ventricular Rate:  87 PR Interval:  208 QRS Duration: 94 QT Interval:  386 QTC Calculation: 464 R Axis:   102 Text Interpretation: Sinus rhythm with Premature atrial complexes Rightward axis Nonspecific ST abnormality Abnormal ECG Confirmed by Carmin Muskrat [id number]) on 06/15/2021 6:12:58 PM  Radiology No  results found.  Procedures Procedures   Medications Ordered in ED Medications - No data to display  ED Course  I have reviewed the triage vital signs and the nursing notes.  Pertinent labs & imaging results that were available during my care of the patient were reviewed by me and considered in my medical decision making (see chart for details).  Initial findings consistent with r hepatic dysfunction, with new evidence for acute kidney injury.  Creatinine is roughly doubled from 12 days ago. Patient has received fluid resuscitation after my initial evaluation, will start lactulose given his confusion  as well.  Adult male with history of hepatocellular carcinoma recent admission for encephalopathy presents with fatigue.  Patient found of acute kidney injury, with evidence for ongoing hepatic dysfunction as well as elevated ammonia level likely all contributing to his withdrawn status, confusion. Patient had fluid resuscitation, lactulose in the ED, no evidence for concurrent infection, nor bacteremia or sepsis.  Patient required admission for monitoring and management.  With fluid resuscitation patient may have sufficient renal function to tolerate contrast enhanced study. MDM Rules/Calculators/A&P MDM Number of Diagnoses or Management Options AKI (acute kidney injury) ([place]): new, needed workup Encephalopathy: established, worsening   Amount and/or Complexity of Data Reviewed Clinical lab tests: reviewed and ordered Tests in the radiology section of CPT: ordered and reviewed Tests in the medicine section of CPT: ordered and reviewed Decide to obtain previous medical records or to obtain history from someone other than the patient: yes Review and summarize past medical records: yes Discuss the patient with other providers: yes Independent visualization of images, tracings, or specimens: yes  Risk of Complications, Morbidity, and/or Mortality Presenting problems: high Diagnostic procedures: high Management options: high  Critical Care Total time providing critical care: 30-74 minutes (35)  Patient Progress Patient progress: stable   Final Clinical Impression(s) / ED Diagnoses Final diagnoses:  Encephalopathy  AKI (acute kidney injury) [facility])    Rx / [place] Orders ED Discharge Orders     None        Carmin Muskrat, MD 06/15/21 2022

## 2021-06-15 NOTE — H&P (Signed)
History and Physical    [name] OXB:[id number] DOB: 1945/10/02 DOA: 06/15/2021  PCP: [name], MD  Patient coming from: Home  I have personally briefly reviewed patient's old medical records in [place]  Chief Complaint: Generalized weakness, AMS  HPI: [name] is a 75 y.o. male with medical history significant of cirrhosis, HCC, iron def anemia due to chronic GIBs from AVMs, gastric varices, s/p TIPS, hepatic encephalopathy.  Pt with iron def anemia in setting of chronic GIB likely from AVMs.  Currently being treated supportively with PRN PRBC transfusions.  See also colonoscopy note from last month.  Pt with recent admit earlier this month for hepatic encephalopathy.  Improved with TID lactulose.  Pt discharged.  Transfusion on 10/7.  Pt presents to ED with c/o generalized weakness.  Reportedly wife concerned pt fell from bed.  The patient was unsure that this actually occurred, rather states that he has been progressively weaker over the past few days at least.  He is slow to answer questions, cannot provide details of recent medication change, diet, activity or any obvious precipitating, alleviating or exacerbating factors.  Reports poor PO intake.   ED Course: Ammonia 81, Creat 1.88 up from 0.97 on 10/6, BUN 42.  Pt given 30g PO lactulose.  1L IVF bolus.   Review of Systems: As per HPI, otherwise all review of systems negative.  Past Medical History:  Diagnosis Date   Arthritis    "joints tighten up sometimes" (03/27/2104)   Carcinoma of liver, hepatocellular ([place]) 06/30/2020   Chronic lower back pain    Coronary artery disease involving native coronary artery of native heart without angina pectoris    Severe left main disease at catheterization July 2015  CABG x3 with a LIMA to the LAD, SVG to the OM, SVG to the PDA on 03/31/14. EF 60% by cath.    Diastolic heart failure (HCC)    GERD without esophagitis 08/04/2010   Hepatic cirrhosis ([place])     a. Dx 01/2014 - CT a/p    History of blood transfusion    "related to bleeding ulcers"   History of concussion    1976--  NO RESIDUAL   History of GI bleed    a. UGIB 07/2012;  b. 01/2014 admission with GIB/FOB stool req 1U prbc's->EGD showed portal gastropathy, barrett's esoph, and chronic active h. pylori gastritis.   History of gout    2007 &  2008  LEFT LEG-- NO ISSUE SINCE   Hyperlipidemia    Iron deficiency anemia    Kidney stones    OA (osteoarthritis of spine)    LOWER BACK--  INTERMITTANT LEFT LEG NUMBNESS   OSA (obstructive sleep apnea)    PULMOLOGIST-  DR CLANCE--  MODERATE OSA  STARTED CPAP 2012--  BUT CURRENTLY HAS NOT USED PAST 6 MONTHS   Phimosis    a. s/p circumcision 2015.   Type 2 diabetes mellitus ([place])    Unspecified essential hypertension     Past Surgical History:  Procedure Laterality Date   ANKLE FRACTURE SURGERY Right 1989   "plate put in"   APPENDECTOMY  05-16-2004   open   BIOPSY  01/08/2021   Procedure: BIOPSY;  Surgeon: Wilford Corner, MD;  Location: [facility];  Service: Endoscopy;;   BIOPSY  05/02/2021   Procedure: BIOPSY;  Surgeon: Arta Silence, MD;  Location: [facility];  Service: Endoscopy;;   CIRCUMCISION N/A 09/09/2013   Procedure: CIRCUMCISION ADULT;  Surgeon: Bernestine Amass, MD;  Location: [place];  Service: Urology;  Laterality: N/A;   COLECTOMY  05-16-2004   COLONOSCOPY N/A 05/02/2021   Procedure: COLONOSCOPY;  Surgeon: Arta Silence, MD;  [facility];  Service: Endoscopy;  Laterality: N/A;   COLONOSCOPY WITH PROPOFOL N/A 11/20/2020   Procedure: COLONOSCOPY WITH PROPOFOL;  Surgeon: Ronald Lobo, MD;  [facility];  Service: Endoscopy;  Laterality: N/A;   CORONARY ANGIOPLASTY WITH STENT PLACEMENT  06/28/2012  DR COOPER   PCI W/  X1 DES to [place]. LAD/  LM  40% OSTIAL & 50-60% DISTAL /  50% PROX LCX/  30-40% PROX RCA & 50% MID RCA/   LVEF 65-70%   CORONARY ARTERY BYPASS GRAFT N/A 03/31/2014    Procedure: CORONARY ARTERY BYPASS GRAFTING (CABG) times 3 using left internal mammary artery and right saphenous vein.;  Surgeon: Melrose Nakayama, MD;  Location: [place];  Service: Open Heart Surgery;  Laterality: N/A;   DOBUTAMINE STRESS ECHO  06-08-2012   MODERATE HYPOKINESIS/ ISCHEMIA MID INFERIOR WALL   ESOPHAGOGASTRODUODENOSCOPY  08/15/2012   Procedure: ESOPHAGOGASTRODUODENOSCOPY (EGD);  Surgeon: Wonda Horner, MD;  Location: [facility] ENDOSCOPY;  Service: Endoscopy;  Laterality: N/A;   ESOPHAGOGASTRODUODENOSCOPY N/A 02/17/2014   Procedure: ESOPHAGOGASTRODUODENOSCOPY (EGD);  Surgeon: Jeryl Columbia, MD;  Location: [facility] ENDOSCOPY;  Service: Endoscopy;  Laterality: N/A;   ESOPHAGOGASTRODUODENOSCOPY N/A 04/17/2020   Procedure: ESOPHAGOGASTRODUODENOSCOPY (EGD);  Surgeon: Ronnette Juniper, MD;  Location: [place];  Service: Gastroenterology;  Laterality: N/A;   ESOPHAGOGASTRODUODENOSCOPY N/A 09/11/2020   Procedure: ESOPHAGOGASTRODUODENOSCOPY (EGD);  Surgeon: Clarene Essex, MD;  Location: [facility];  Service: Endoscopy;  Laterality: N/A;   ESOPHAGOGASTRODUODENOSCOPY  10/09/2020   ESOPHAGOGASTRODUODENOSCOPY N/A 10/09/2020   Procedure: ESOPHAGOGASTRODUODENOSCOPY (EGD);  Surgeon: Otis Brace, MD;  Location[facility] ENDOSCOPY;  Service: Gastroenterology;  Laterality: N/A;   ESOPHAGOGASTRODUODENOSCOPY N/A 01/08/2021   Procedure: ESOPHAGOGASTRODUODENOSCOPY (EGD);  Surgeon: Wilford Corner, MD;  Location: [facility];  Service: Endoscopy;  Laterality: N/A;   ESOPHAGOGASTRODUODENOSCOPY N/A 05/02/2021   Procedure: ESOPHAGOGASTRODUODENOSCOPY (EGD);  Surgeon: Arta Silence, MD;  Location: [facility];  Service: Endoscopy;  Laterality: N/A;   ESOPHAGOGASTRODUODENOSCOPY (EGD) WITH PROPOFOL N/A 09/30/2017   Procedure: ESOPHAGOGASTRODUODENOSCOPY (EGD) WITH PROPOFOL;  Surgeon: Wonda Horner, MD;  Location[facility] ENDOSCOPY;  Service: Endoscopy;  Laterality: N/A;   ESOPHAGOGASTRODUODENOSCOPY (EGD) WITH PROPOFOL N/A 12/20/2017    Procedure: ESOPHAGOGASTRODUODENOSCOPY (EGD) WITH PROPOFOL;  Surgeon: Clarene Essex, MD;  Location: [place];  Service: Endoscopy;  Laterality: N/A;   ESOPHAGOGASTRODUODENOSCOPY (EGD) WITH PROPOFOL N/A 08/10/2020   Procedure: ESOPHAGOGASTRODUODENOSCOPY (EGD) WITH PROPOFOL;  Surgeon: Wilford Corner, MD;  Location: [place];  Service: Endoscopy;  Laterality: N/A;   ESOPHAGOGASTRODUODENOSCOPY (EGD) WITH PROPOFOL N/A 11/20/2020   Procedure: ESOPHAGOGASTRODUODENOSCOPY (EGD) WITH PROPOFOL;  Surgeon: Ronald Lobo, MD;  [facility];  Service: Endoscopy;  Laterality: N/A;   ESOPHAGOGASTRODUODENOSCOPY (EGD) WITH PROPOFOL N/A 02/28/2021   Procedure: ESOPHAGOGASTRODUODENOSCOPY (EGD) WITH PROPOFOL;  Surgeon: Clarene Essex, MD;  [facility];  Service: Endoscopy;  Laterality: N/A;   GIVENS CAPSULE STUDY N/A 11/20/2020   Procedure: GIVENS CAPSULE STUDY;  Surgeon: Ronald Lobo, MD;  [facility];  Service: Endoscopy;  Laterality: N/A;   HEMOSTASIS CLIP PLACEMENT  05/02/2021   Procedure: HEMOSTASIS CLIP PLACEMENT;  Surgeon: Arta Silence, MD;  [facility];  Service: Endoscopy;;   HERNIA REPAIR     INTRAOPERATIVE TRANSESOPHAGEAL ECHOCARDIOGRAM N/A 03/31/2014   Procedure: INTRAOPERATIVE TRANSESOPHAGEAL ECHOCARDIOGRAM;  Surgeon: Melrose Nakayama, MD;  Location: [place];  Service: Open Heart Surgery;  Laterality: N/A;  IR ANGIOGRAM SELECTIVE EACH ADDITIONAL VESSEL  06/16/2020   IR ANGIOGRAM SELECTIVE EACH ADDITIONAL VESSEL  06/16/2020   IR ANGIOGRAM SELECTIVE EACH ADDITIONAL VESSEL  06/16/2020   IR ANGIOGRAM SELECTIVE EACH ADDITIONAL VESSEL  06/16/2020   IR ANGIOGRAM SELECTIVE EACH ADDITIONAL VESSEL  06/16/2020   IR ANGIOGRAM SELECTIVE EACH ADDITIONAL VESSEL  06/16/2020   IR ANGIOGRAM SELECTIVE EACH ADDITIONAL VESSEL  06/16/2020   IR ANGIOGRAM SELECTIVE EACH ADDITIONAL VESSEL  06/16/2020   IR ANGIOGRAM SELECTIVE EACH ADDITIONAL VESSEL  06/16/2020   IR ANGIOGRAM  SELECTIVE EACH ADDITIONAL VESSEL  06/30/2020   IR ANGIOGRAM SELECTIVE EACH ADDITIONAL VESSEL  06/30/2020   IR ANGIOGRAM VISCERAL SELECTIVE  06/16/2020   IR ANGIOGRAM VISCERAL SELECTIVE  06/30/2020   IR EMBO ARTERIAL NOT HEMORR HEMANG INC GUIDE ROADMAPPING  06/16/2020   IR EMBO TUMOR ORGAN ISCHEMIA INFARCT INC GUIDE ROADMAPPING  06/30/2020   IR EMBO VENOUS NOT HEMORR HEMANG  INC GUIDE ROADMAPPING  01/13/2021   IR IVUS EACH ADDITIONAL NON CORONARY VESSEL  01/13/2021   IR RADIOLOGIST EVAL & MGMT  05/06/2020   IR RADIOLOGIST EVAL & MGMT  05/20/2020   IR RADIOLOGIST EVAL & MGMT  05/25/2021   IR TIPS  01/13/2021   IR US GUIDE VASC ACCESS RIGHT  06/16/2020   IR US GUIDE VASC ACCESS RIGHT  06/30/2020   IR US GUIDE VASC ACCESS RIGHT  01/13/2021   LAPAROSCOPIC UMBILICAL HERNIA REPAIR W/ MESH  06-06-2011   LEFT HEART CATHETERIZATION WITH CORONARY ANGIOGRAM N/A 03/28/2014   Procedure: LEFT HEART CATHETERIZATION WITH CORONARY ANGIOGRAM;  Surgeon: Sinclair Grooms, MD;  Location: [facility] CATH LAB;  Service: Cardiovascular;  Laterality: N/A;   LIPOMA EXCISION Left 08/25/2017   Procedure: EXCISION LIPOMA LEFT POSTERIOR THIGH;  Surgeon: Clovis Riley, MD;  Location: [place];  Service: General;  Laterality: Left;   NEPHROLITHOTOMY  1990'S   OPEN APPENDECTOMY W/ PARTIAL CECECTOMY  05-16-2004   PERCUTANEOUS CORONARY STENT INTERVENTION (PCI-S) N/A 06/28/2012   Procedure: PERCUTANEOUS CORONARY STENT INTERVENTION (PCI-S);  Surgeon: Sherren Mocha, MD;  Location: [facility] CATH LAB;  Service: Cardiovascular;  Laterality: N/A;   POLYPECTOMY  11/20/2020   Procedure: POLYPECTOMY;  Surgeon: Ronald Lobo, MD;  [facility];  Service: Endoscopy;;   POLYPECTOMY  05/02/2021   Procedure: POLYPECTOMY;  Surgeon: Arta Silence, MD;  [facility];  Service: Endoscopy;;   RADIOLOGY WITH ANESTHESIA N/A 01/13/2021   Procedure: IR WITH ANESTHESIA - TIPS;  Surgeon: Suzette Battiest, MD;  Location: [place];  Service: Radiology;   Laterality: N/A;     reports that he quit smoking about 24 years ago. His smoking use included cigarettes. He has a 80.00 pack-year smoking history. He has never used smokeless tobacco. He reports that he does not currently use alcohol after a past usage of about 8.0 standard drinks per week. He reports that he does not use drugs.  Allergies  Allergen Reactions   Fluzone Quadrivalent [Influenza Vac Split Quad] Other (See Comments)    The patient stated, in 10/2020: "I am not taking any more flu shots. It liked to have killed me."    Family History  Problem Relation Age of Onset   Lung cancer Sister    Cancer Sister        lung   Cancer Mother    Cancer Father        died in his 29s.   Cancer Brother        lung   Coronary artery disease  Other    Diabetes Other    Colon cancer Other    Cancer Sister        lung     Prior to Admission medications   Medication Sig Start Date End Date Taking? Authorizing Provider  albuterol (VENTOLIN HFA) 108 (90 Base) MCG/ACT inhaler Inhale 2 puffs into the lungs every 6 (six) hours as needed for wheezing or shortness of breath. 03/01/21  Yes Aline August, MD  ANORO ELLIPTA 62.5-25 MCG/ACT AEPB Inhale 1 puff into the lungs daily.   Yes [provider]  ferrous sulfate 325 (65 FE) MG tablet Take 1 tablet (325 mg total) by mouth daily with breakfast. 05/06/21 08/04/21 Yes Dahal, Marlowe Aschoff, MD  furosemide (LASIX) 40 MG tablet Take 1 tablet (40 mg total) by mouth daily. 05/05/21 08/03/21 Yes Dahal, Marlowe Aschoff, MD  lactulose (CHRONULAC) 10 GM/15ML solution Take 45 mLs (30 g total) by mouth 3 (three) times daily. 03/31/21  Yes Debbe Odea, MD  pantoprazole (PROTONIX) 40 MG tablet Take 1 tablet (40 mg total) by mouth daily. Patient taking differently: Take 40 mg by mouth 2 (two) times daily before a meal. 03/31/21  Yes Rizwan, Eunice Blase, MD  spironolactone (ALDACTONE) 50 MG tablet Take 1 tablet (50 mg total) by mouth daily. 03/07/21  Yes Ghimire, Henreitta Leber, MD   Accu-Chek Softclix Lancets lancets USE AS DIRECTED TO TEST BLOOD SUGAR FOUR TIMES DAILY 03/13/20   [name], MD  blood glucose meter kit and supplies Dispense based on patient and insurance preference. Use up to four times daily as directed. (FOR ICD-10 E10.9, E11.9). 03/12/20   [name], MD  feeding supplement (ENSURE ENLIVE / ENSURE PLUS) LIQD Take 237 mLs by mouth 2 (two) times daily between meals. Patient not taking: Reported on 06/15/2021 06/03/21 07/03/21  Arrien, Jimmy Picket, MD  Multiple Vitamin (MULTIVITAMIN WITH MINERALS) TABS tablet Take 1 tablet by mouth daily. Patient not taking: No sig reported 06/04/21 07/04/21  Tawni Millers, MD    Physical Exam: Vitals:   06/15/21 1844 06/15/21 1845 06/15/21 1900 06/15/21 1930  BP:  (!) 146/96 (!) 134/52 (!) 126/51  Pulse:  85 85 89  Resp:  16 14 (!) 21  Temp:      SpO2:  100% 100% 100%  Weight: 90.1 kg     Height: _0  (1.727 m)       Constitutional: Ill appearing Eyes: PERRL, lids and conjunctivae normal ENMT: Mucous membranes are dry. Posterior pharynx clear of any exudate or lesions.Normal dentition.  Neck: normal, supple, no masses, no thyromegaly Respiratory: clear to auscultation bilaterally, no wheezing, no crackles. Normal respiratory effort. No accessory muscle use.  Cardiovascular: Regular rate and rhythm, no murmurs / rubs / gallops. No extremity edema. 2+ pedal pulses. No carotid bruits.  Abdomen: no tenderness, no masses palpated. No hepatosplenomegaly. Bowel sounds positive.  Musculoskeletal: no clubbing / cyanosis. No joint deformity upper and lower extremities. Good ROM, no contractures. Normal muscle tone.  Skin: Venous stasis BLE Neurologic: CN 2-12 grossly intact. Sensation intact, DTR normal. Strength 5/5 in all 4.  Psychiatric: Slow speech, MAE, slowed mentation.   Labs on Admission: I have personally reviewed following labs and imaging studies  CBC: Recent Labs  Lab  06/15/21 1500  WBC 5.8  NEUTROABS 4.3  HGB 11.6*  HCT 39.7  MCV 87.4  PLT 315   Basic Metabolic Panel: Recent Labs  Lab 06/15/21 1500  NA 141  K 3.9  CL 108  CO2 21*  GLUCOSE 144*  BUN 42*  CREATININE 1.88*  CALCIUM 9.6   GFR: Estimated Creatinine Clearance: 37 mL/min (A) (by C-G formula based on SCr of 1.88 mg/dL (H)). Liver Function Tests: Recent Labs  Lab 06/15/21 1500  AST 57*  ALT 40  ALKPHOS 349*  BILITOT 1.5*  PROT 7.6  ALBUMIN 3.2*   No results for input(s): LIPASE, AMYLASE in the last 168 hours. Recent Labs  Lab 06/15/21 1445  AMMONIA 81*   Coagulation Profile: Recent Labs  Lab 06/15/21 1500  INR 1.3*   Cardiac Enzymes: No results for input(s): CKTOTAL, CKMB, CKMBINDEX, TROPONINI in the last 168 hours. BNP (last 3 results) No results for input(s): PROBNP in the last 8760 hours. HbA1C: No results for input(s): HGBA1C in the last 72 hours. CBG: Recent Labs  Lab 06/15/21 1449  GLUCAP 119*   Lipid Profile: No results for input(s): CHOL, HDL, LDLCALC, TRIG, CHOLHDL, LDLDIRECT in the last 72 hours. Thyroid Function Tests: No results for input(s): TSH, T4TOTAL, FREET4, T3FREE, THYROIDAB in the last 72 hours. Anemia Panel: No results for input(s): VITAMINB12, FOLATE, FERRITIN, TIBC, IRON, RETICCTPCT in the last 72 hours. Urine analysis:    Component Value Date/Time   COLORURINE YELLOW 05/28/2021 0632   APPEARANCEUR CLEAR 05/28/2021 0632   LABSPEC 1.009 05/28/2021 0632   PHURINE 7.0 05/28/2021 0632   GLUCOSEU NEGATIVE 05/28/2021 0632   HGBUR SMALL (A) 05/28/2021 0632   BILIRUBINUR NEGATIVE 05/28/2021 0632   KETONESUR NEGATIVE 05/28/2021 0632   PROTEINUR NEGATIVE 05/28/2021 0632   UROBILINOGEN 0.2 03/29/2014 1546   NITRITE NEGATIVE 05/28/2021 0632   LEUKOCYTESUR TRACE (A) 05/28/2021 1884    Radiological Exams on Admission: No results found.  EKG: Independently reviewed.  Assessment/Plan Principal Problem:   Acute hepatic  encephalopathy Active Problems:   Type 2 diabetes with complication (HCC)   AKI (acute kidney injury) ([place])   Alcoholic cirrhosis of liver with ascites (HCC)   Iron deficiency anemia due to chronic blood loss   Hepatocellular carcinoma (HCC)   GI bleeding    Acute hepatic encephalopathy - Lactulose 30g TID Adding rifaxan Cirrhosis / h/o HCC- Hold diuretics in setting of AKI AKI - Definitely think dehydration the cause here IVF: 1L bolus in ED then LR at 100 / hr overnight Strict intake and output Repeat CMP in AM Hold diuretics Iron def anemia due to chronic blood loss - Pt with ongoing / intermittent GIBs from AVMs HGB 11.6 today improved significantly compared to admission earlier this month Repeat CBC in AM Deconditioning - PT/OT Recd SNF last admit... DM2 - Sensitive SSI AC  DVT prophylaxis: SCDs Code Status: Full Family Communication: No family in room Disposition Plan: Home after mental status improved Consults called: None Admission status: Place in 29   Rober Skeels, [place] [facility]  How to contact the [facility] Attending or Consulting provider [place] or covering provider during after hours [place], for this patient?  Check the care team in [facility] and look for a) attending/consulting [facility] provider listed and b) the [facility] team listed Log into [email or web address]  Amion Physician Scheduling and messaging for groups and whole hospitals  On call and physician scheduling software for group practices, residents, hospitalists and other medical providers for call, clinic, rotation and shift schedules. On[employer] is a hospital-wide system for scheduling doctors and paging doctors on call. EasyPlot is for scientific plotting and data analysis.  [email or web address]  and use [place]'s universal password to access. If you do not have  the password, please contact the hospital operator.  Locate the [facility] provider you are looking for under [facility] and page to a number  that you can be directly reached. If you still have difficulty reaching the provider, please page the [facility] (Director on Call) for the Hospitalists listed on amion for assistance.  06/15/2021, 8:34 PM

## 2021-06-16 ENCOUNTER — Other Ambulatory Visit: Payer: Self-pay

## 2021-06-16 ENCOUNTER — Encounter (HOSPITAL_COMMUNITY): Payer: Self-pay | Admitting: Internal Medicine

## 2021-06-16 DIAGNOSIS — K7682 Hepatic encephalopathy: Secondary | ICD-10-CM | POA: Diagnosis not present

## 2021-06-16 DIAGNOSIS — N179 Acute kidney failure, unspecified: Secondary | ICD-10-CM | POA: Diagnosis not present

## 2021-06-16 LAB — CBC
HCT: 34.8 % — ABNORMAL LOW (ref 39.0–52.0)
Hemoglobin: 10.2 g/dL — ABNORMAL LOW (ref 13.0–17.0)
MCH: 25.6 pg — ABNORMAL LOW (ref 26.0–34.0)
MCHC: 29.3 g/dL — ABNORMAL LOW (ref 30.0–36.0)
MCV: 87.2 fL (ref 80.0–100.0)
Platelets: 145 10*3/uL — ABNORMAL LOW (ref 150–400)
RBC: 3.99 MIL/uL — ABNORMAL LOW (ref 4.22–5.81)
RDW: 19.9 % — ABNORMAL HIGH (ref 11.5–15.5)
WBC: 5.5 10*3/uL (ref 4.0–10.5)
nRBC: 0 % (ref 0.0–0.2)

## 2021-06-16 LAB — COMPREHENSIVE METABOLIC PANEL
ALT: 34 U/L (ref 0–44)
AST: 43 U/L — ABNORMAL HIGH (ref 15–41)
Albumin: 2.8 g/dL — ABNORMAL LOW (ref 3.5–5.0)
Alkaline Phosphatase: 292 U/L — ABNORMAL HIGH (ref 38–126)
Anion gap: 8 (ref 5–15)
BUN: 42 mg/dL — ABNORMAL HIGH (ref 8–23)
CO2: 21 mmol/L — ABNORMAL LOW (ref 22–32)
Calcium: 8.7 mg/dL — ABNORMAL LOW (ref 8.9–10.3)
Chloride: 107 mmol/L (ref 98–111)
Creatinine, Ser: 1.78 mg/dL — ABNORMAL HIGH (ref 0.61–1.24)
GFR, Estimated: 39 mL/min — ABNORMAL LOW (ref >60)
Glucose, Bld: 114 mg/dL — ABNORMAL HIGH (ref 70–99)
Potassium: 3.4 mmol/L — ABNORMAL LOW (ref 3.5–5.1)
Sodium: 136 mmol/L (ref 135–145)
Total Bilirubin: 1.4 mg/dL — ABNORMAL HIGH (ref 0.3–1.2)
Total Protein: 6.4 g/dL — ABNORMAL LOW (ref 6.5–8.1)

## 2021-06-16 LAB — GLUCOSE, CAPILLARY
Glucose-Capillary: 141 mg/dL — ABNORMAL HIGH (ref 70–99)
Glucose-Capillary: 81 mg/dL (ref 70–99)
Glucose-Capillary: 132 mg/dL — ABNORMAL HIGH (ref 70–99)

## 2021-06-16 NOTE — Evaluation (Signed)
Physical Therapy Evaluation Patient Details Name: [name] MRN: [id number] DOB: [birth year] Today's Date: 06/16/2021  History of Present Illness  Pt presents to ED with c/o generalized weakness, hepatic encephalopathy;  has a past medical history of Arthritis, Carcinoma of liver, hepatocellular ([place]) (06/30/2020), Chronic lower back pain, Coronary artery disease, Diastolic heart failure ([place]), GERD without esophagitis (08/04/2010), Hepatic cirrhosis ([place]), History of blood transfusion, History of concussion, History of GI bleed, gout, Iron deficiency anemia, OA (osteoarthritis of spine), Phimosis, Type 2 diabetes mellitus  Clinical Impression  Pt admitted with above diagnosis. Lives at home with family; Wife may not be able to help with physical assist, however, pt indicated his son can help -- would like to confirm this; walked with a walker at home; Reported independence with bathing/dressing; Presents to PT with generalized weakness, decr trunk mobiltiy, balance deficits, and unsteady, inefficient gait; Pt currently with functional limitations due to the deficits listed below (see PT Problem List). Pt will benefit from skilled PT to increase their independence and safety with mobility to allow discharge to the venue listed below.          Recommendations for follow up therapy are one component of a multi-disciplinary discharge planning process, led by the attending physician.  Recommendations may be updated based on patient status, additional functional criteria and insurance authorization.  Follow Up Recommendations Home health PT;Supervision/Assistance - 24 hour    Equipment Recommendations  Rolling walker with 5" wheels (May already have one)    Recommendations for Other Services       Precautions / Restrictions Precautions Precautions: Fall      Mobility  Bed Mobility Overal bed mobility: Needs Assistance Bed Mobility: Supine to Sit     Supine to sit: Min assist      General bed mobility comments: Moves extremely slowly    Transfers Overall transfer level: Needs assistance Equipment used: Rolling walker (2 wheeled) Transfers: Sit to/from Stand Sit to Stand: Min assist         General transfer comment: Slow moving, but noteworthy that he organized his center of mass and weight shifted well anteriorly to initiate sit to stand  Ambulation/Gait Ambulation/Gait assistance: Min assist;+2 safety/equipment Gait Distance (Feet): 50 Feet Assistive device: Rolling walker (2 wheeled) Gait Pattern/deviations: Decreased step length - right;Decreased step length - left;Festinating     General Gait Details: initially very short steps, wide stance, with almost festinating pattern; tactile cues for weight shifting fully onto stance LE and allow for better step length  Stairs            Wheelchair Mobility    Modified Rankin (Stroke Patients Only)       Balance     Sitting balance-Leahy Scale: Fair       Standing balance-Leahy Scale: Poor (approaching Fair)                               Pertinent Vitals/Pain Pain Assessment: No/denies pain (mentioned feeling stiff) Faces Pain Scale: No hurt Pain Intervention(s): Monitored during session    Home Living Family/patient expects to be discharged to:: Private residence Living Arrangements: Spouse/significant other;Children Available Help at Discharge: Family Type of Home: House Home Access: Stairs to enter Entrance Stairs-Rails: Left Entrance Stairs-Number of Steps: 1 Home Layout: Two level;Able to live on main level with bedroom/bathroom Home Equipment: Kasandra Knudsen - single point;Walker - 2 wheels;Shower seat;Bedside commode      Prior Function Level of  Independence: Independent with assistive device(s)         Comments: pt reports he ususally walks with rollator or RW, sometimes does not use device depending on the day     Hand Dominance   Dominant Hand: Right     Extremity/Trunk Assessment   Upper Extremity Assessment Upper Extremity Assessment: Defer to OT evaluation    Lower Extremity Assessment Lower Extremity Assessment: Generalized weakness       Communication   Communication: No difficulties  Cognition Arousal/Alertness: Awake/alert Behavior During Therapy: WFL for tasks assessed/performed Overall Cognitive Status: No family/caregiver present to determine baseline cognitive functioning Area of Impairment: Following commands;Safety/judgement;Problem solving;Memory                     Memory: Decreased recall of precautions;Decreased short-term memory Following Commands: Follows one step commands with increased time Safety/Judgement: Decreased awareness of deficits;Decreased awareness of safety   Problem Solving: Slow processing;Difficulty sequencing;Requires verbal cues;Requires tactile cues General Comments: After spending time the bulk of the session with walking, and giving max cues for increasing step length, asked him what I repeated for him to work on, and he answered, "my legs"      General Comments      Exercises     Assessment/Plan    PT Assessment Patient needs continued PT services  PT Problem List Decreased balance;Decreased knowledge of use of DME;Decreased activity tolerance;Decreased strength;Decreased mobility       PT Treatment Interventions DME instruction;Therapeutic activities;Gait training;Functional mobility training;Therapeutic exercise;Patient/family education;Balance training    PT Goals (Current goals can be found in the Care Plan section)       Frequency Min 3X/week   Barriers to discharge Other (comment) Would like to confirm relaible assistance at home    Co-evaluation               AM-PAC PT "6 Clicks" Mobility  Outcome Measure Help needed turning from your back to your side while in a flat bed without using bedrails?: A Little Help needed moving from lying on your back  to sitting on the side of a flat bed without using bedrails?: A Little Help needed moving to and from a bed to a chair (including a wheelchair)?: A Little Help needed standing up from a chair using your arms (e.g., wheelchair or bedside chair)?: A Little Help needed to walk in hospital room?: A Lot Help needed climbing 3-5 steps with a railing? : A Lot 6 Click Score: 16    End of Session Equipment Utilized During Treatment: Gait belt Activity Tolerance: Patient tolerated treatment well Patient left: in chair;with call bell/phone within reach;with chair alarm set Nurse Communication: Mobility status PT Visit Diagnosis: Difficulty in walking, not elsewhere classified (R26.2);Muscle weakness (generalized) (M62.81)    Time: 0930-1002 PT Time Calculation (min) (ACUTE ONLY): 32 min   Charges:   PT Evaluation $PT Eval Low Complexity: 1 Low PT Treatments $Gait Training: 8-22 mins        Roney Marion, PT  Acute Rehabilitation Services Pager [phone] Office [place] 06/16/2021, 3:26 PM

## 2021-06-16 NOTE — TOC Progression Note (Signed)
Transition of Care [facility]) - Initial/Assessment Note    Patient Details  Name: [name] MRN: [id number] Date of Birth: 07/15/1946  [facility] CM/SW Contact:    Milinda Antis, [place]  Phone Number: 06/16/2021, 10:48 AM  Clinical Narrative:                 CSW performed a chart review and observed that PT would be recommending home with HHPT/OT if the patient has reliable assistance at home.  CSW called the patient's son to inquire about the family's ability to provide around the clock care for the patient should he return home.  There was no answer.  CSW left a VM requesting a returned call.          Patient Goals and CMS Choice        Expected Discharge Plan and Services                                                Prior Living Arrangements/Services                       Activities of Daily Living Home Assistive Devices/Equipment: Cane (specify quad or straight), Walker (specify type), Shower chair with back, Bedside commode/3-in-1, Other (Comment) ADL Screening (condition at time of admission) Patient's cognitive ability adequate to safely complete daily activities?: No Is the patient deaf or have difficulty hearing?: No Does the patient have difficulty seeing, even when wearing glasses/contacts?: No Does the patient have difficulty concentrating, remembering, or making decisions?: Yes Patient able to express need for assistance with ADLs?: Yes Does the patient have difficulty dressing or bathing?: Yes Independently performs ADLs?: No Communication: Independent Dressing (OT): Needs assistance Is this a change from baseline?: Change from baseline, expected to last >3 days Grooming: Needs assistance Is this a change from baseline?: Change from baseline, expected to last >3 days Feeding: Needs assistance Is this a change from baseline?: Change from baseline, expected to last >3 days Bathing: Needs assistance Is this a change from  baseline?: Change from baseline, expected to last >3 days Toileting: Needs assistance Is this a change from baseline?: Change from baseline, expected to last >3days In/Out Bed: Needs assistance Is this a change from baseline?: Change from baseline, expected to last >3 days Walks in Home: Needs assistance Is this a change from baseline?: Change from baseline, expected to last >3 days Does the patient have difficulty walking or climbing stairs?: Yes Weakness of Legs: Both Weakness of Arms/Hands: None  Permission Sought/Granted                  Emotional Assessment              Admission diagnosis:  Encephalopathy [G93.40] AKI (acute kidney injury) ([place]) [N17.9] Acute hepatic encephalopathy [K76.82] Patient Active Problem List   Diagnosis Date Noted   Acute hepatic encephalopathy 06/15/2021   Acute upper GI bleed 04/29/2021   Cirrhosis ([place]) 04/29/2021   Acute on chronic anemia 04/28/2021   GI bleeding 09/10/2020   Hepatocellular carcinoma ([place]) 08/09/2020   Routine general medical examination at a health care facility 05/06/2020   Iron deficiency anemia due to chronic blood loss    Pancytopenia ([place]) 04/15/2020   Pulmonary hypertension, unspecified ([place]) 03/11/2020   Asthma, mild intermittent 62/37/6283   Alcoholic cirrhosis of liver  with ascites ([place]) 12/27/2019   Acute on chronic diastolic CHF (congestive heart failure) ([place]) 11/01/2019   (HFpEF) heart failure with preserved ejection fraction ([place]) 12/24/2018   Peptic ulcer disease 12/18/2017   AKI (acute kidney injury) ([place]) 10/24/2017   GI bleed 09/29/2017   SOB (shortness of breath) 09/21/2017   Obesity (BMI 30.0-34.9) 02/05/2016   S/P CABG x 3 03/31/2014   Hepatic cirrhosis ([place]) 02/27/2014   [place] 08/14/2012   Hyperlipidemia with target LDL less than 70 06/29/2012   Thrombocytopenia ([place]) 06/29/2012   Coronary artery disease involving native coronary artery of native heart without angina pectoris    OSA  (obstructive sleep apnea)    Type 2 diabetes with complication ([place]) 73/22/0254   GERD without esophagitis 08/04/2010   History of gout    Hypertensive heart disease    PCP:  [name], MD Pharmacy:   [facility] [place], [place] - [address] AT [facility] [address] [place] [address] Phone: [phone] Fax: [phone]     Social Determinants of Health (SDOH) Interventions    Readmission Risk Interventions Readmission Risk Prevention Plan 01/26/2021 08/12/2020 08/12/2020  Transportation Screening Complete - Complete  PCP or Specialist Appt within 5-7 Days - - -  Not Complete comments - - -  PCP or Specialist Appt within 3-5 Days - Complete -  Home Care Screening - - -  Medication Review (RN CM) - - -  [place] or [place] - - Complete  Social Work Consult for [place] Planning/Counseling - - Complete  Palliative Care Screening - - Not Applicable  Medication Review ([place]) Complete - -  PCP or Specialist appointment within 3-5 days of discharge Complete - -  [place] or Home Care Consult Complete - -  SW Recovery Care/Counseling Consult Complete - -  Palliative Care Screening Not Applicable - -  [place] Patient Refused - -  Some recent data might be hidden

## 2021-06-16 NOTE — Progress Notes (Signed)
Progress Note    SIRE POET   GGY:[id number]  DOB: [birth year]  DOA: 06/15/2021     0 Date of Service: 06/16/2021   Clinical Course John John is a 75 y.o. male with medical history significant of cirrhosis, HCC, iron def anemia due to chronic GIBs from AVMs, gastric varices, s/p TIPS, hepatic encephalopathy. He has history of iron deficiency anemia from chronic GI bleeds due to underlying AVMs.  He is treated with blood transfusions as needed. Recently underwent colonoscopy last month.  He was also recently admitted earlier this month for hepatic encephalopathy and improved with lactulose. He again presented with generalized weakness and and concern for a fall at home.  He was very slow in mentation on admission.  He was started on lactulose, rifaximin and admitted for further work-up.  Assessment and Plan  Acute hepatic encephalopathy - Ammonia elevated to 81 on admission and patient noted to be slow in mentation - Was started on lactulose, rifaximin - Mentation is improved this morning and he is oriented x3.  Movements are still very slow - Continue working with PT  Cirrhosis / h/o [place] - Hold diuretics in setting of AKI  AKI  -Considered prerenal on admission due to poor oral intake at home -Continue fluids and trending BMP -Renal function has improved since admission  Iron def anemia due to chronic blood loss  -Hemoglobin has improved compared to earlier this month.  11.6 g/dL on admission - Continue trending CBC  Deconditioning  - PT/OT - SNF was recommended last hospitalization   DM2 - continue SSI and CBG monitoring    Subjective:  Awake and alert this morning eating breakfast.  Oriented x3 but his movements are extremely slowed.  Objective Vitals:   06/15/21 1930 06/15/21 2330 06/16/21 0003 06/16/21 0806  BP: (!) 126/51 137/64 129/64 (!) 126/50  Pulse: 89 93 88 78  Resp: (!) 21 14 17 18   Temp:   97.8 F (36.6 C) 97.8 F (36.6 C)  TempSrc:    Oral   SpO2: 100% 98% 100% 100%  Weight:      Height:   5' 7.99" (1.727 m)    90.1 kg  Vital signs were reviewed and unremarkable.   Exam Physical Exam Constitutional:      Comments: Chronically ill-appearing elderly gentleman with slowed mentation resting in bed in no distress  HENT:     Head: Normocephalic and atraumatic.     Mouth/Throat:     Mouth: Mucous membranes are moist.  Eyes:     Extraocular Movements: Extraocular movements intact.  Cardiovascular:     Rate and Rhythm: Normal rate and regular rhythm.  Pulmonary:     Effort: Pulmonary effort is normal.     Breath sounds: Normal breath sounds. No wheezing.  Abdominal:     General: Bowel sounds are normal. There is no distension.     Palpations: Abdomen is soft.     Tenderness: There is no abdominal tenderness.  Musculoskeletal:        General: Normal range of motion.     Cervical back: Normal range of motion and neck supple.  Skin:    General: Skin is warm and dry.  Neurological:     General: No focal deficit present.     Comments: No asterixis  Psychiatric:        Mood and Affect: Mood normal.     Labs / Other Information My review of labs, imaging, notes and other tests shows no new  significant findings.    Disposition Plan: Status is: Observation  The patient will require care spanning > 2 midnights and should be moved to inpatient because: AKI still on IVF. Ongoing improvement in encephalopathy needed with ongoing lactulose and rifaximin use with monitoring.  Evaluation by PT/OT and possible rehab placement     Time spent: Greater than 50% of the 35 minute visit was spent in counseling/coordination of care for the patient as laid out in the A&P.  Dwyane [name], MD [facility] 06/16/2021, 12:17 PM

## 2021-06-16 NOTE — Progress Notes (Signed)
Physical Therapy Note  PT eval complete with full note to follow;  Recommend Home with HHPT and potentially HHOT for transition out of hospital;  Need to confirm that he has around the clock reliable assist (tells [place] his son can help him, and that his wife can't give him physical assist;  If very slow to progress, may consider SNF for post-acute rehab;  Will follow,   Roney Marion, PT  Acute Rehabilitation Services Pager [phone] Office [phone]

## 2021-06-16 NOTE — Evaluation (Signed)
Occupational Therapy Evaluation Patient Details Name: [name] MRN: [id number] DOB: 10/20/1945 Today's Date: 06/16/2021   History of Present Illness Pt presents to ED with c/o generalized weakness, hepatic encephalopathy;  has a past medical history of Arthritis, Carcinoma of liver, hepatocellular ([place]) (06/30/2020), Chronic lower back pain, Coronary artery disease, Diastolic heart failure ([place]), GERD without esophagitis (08/04/2010), Hepatic cirrhosis ([place]), History of blood transfusion, History of concussion, History of GI bleed, gout, Iron deficiency anemia, OA (osteoarthritis of spine), Phimosis, Type 2 diabetes mellitus   Clinical Impression   Patient admitted for the above diagnosis.  It appears he had a recent hospitalization where SNF was recommended.  The patient was woken from a nap, he appeared lethargic and confused, very slow moving.  PTA, per the chart, he lives with his spouse and son at home.  The son appears to be the main caregiver.  Deficits impacting independence are listed below.  Currently he is needing up to [place] a for basic mobility, including safety cues, and needing up to Mod A for lower body ADL from a sit/stand level.  PT is recommending home with [facility] services, home is a possibility if family can provide up to Mod A.  It is hoped he will begin to clear, and his independence will improve.  OT will continue in the acute setting to maximize function and adjust discharge recommendations as needed.          Recommendations for follow up therapy are one component of a multi-disciplinary discharge planning process, led by the attending physician.  Recommendations may be updated based on patient status, additional functional criteria and insurance authorization.   Follow Up Recommendations  Home health OT;Supervision/Assistance - 24 hour    Equipment Recommendations  None recommended by OT    Recommendations for Other Services       Precautions / Restrictions  Precautions Precautions: Fall Restrictions Weight Bearing Restrictions: No      Mobility Bed Mobility Overal bed mobility: Needs Assistance Bed Mobility: Supine to Sit     Supine to sit: Min assist     General bed mobility comments: Moves extremely slowly Patient Response: Flat affect  Transfers Overall transfer level: Needs assistance Equipment used: Rolling walker (2 wheeled) Transfers: Sit to/from [facility] Sit to Stand: Min assist Stand pivot transfers: Min assist       General transfer comment: Slow moving, but noteworthy that he organized his center of mass and weight shifted well anteriorly to initiate sit to stand    Balance Overall balance assessment: Needs assistance Sitting-balance support: Feet supported Sitting balance-Leahy Scale: Fair     Standing balance support: Bilateral upper extremity supported Standing balance-Leahy Scale: Poor                             ADL either performed or assessed with clinical judgement   ADL       Grooming: Wash/dry face;Wash/dry hands;Minimal assistance;Sitting       Lower Body Bathing: Maximal assistance;Sit to/from stand       Lower Body Dressing: Maximal assistance;Sit to/from stand               Functional mobility during ADLs: Minimal assistance;Rolling walker;Cueing for sequencing;Cueing for safety       Vision Patient Visual Report: No change from baseline       Perception     Praxis      Pertinent Vitals/Pain Pain Assessment: Faces Faces Pain  Scale: No hurt Pain Intervention(s): Monitored during session     Hand Dominance Right   Extremity/Trunk Assessment Upper Extremity Assessment Upper Extremity Assessment: Generalized weakness   Lower Extremity Assessment Lower Extremity Assessment: Defer to PT evaluation   Cervical / Trunk Assessment Cervical / Trunk Assessment: Normal   Communication Communication Communication: No difficulties    Cognition Arousal/Alertness: Lethargic Behavior During Therapy: Flat affect Overall Cognitive Status: No family/caregiver present to determine baseline cognitive functioning Area of Impairment: Following commands;Safety/judgement;Problem solving;Memory                     Memory: Decreased short-term memory Following Commands: Follows one step commands with increased time Safety/Judgement: Decreased awareness of deficits   Problem Solving: Slow processing;Difficulty sequencing;Requires verbal cues;Requires tactile cues    General Comments       Exercises     Shoulder Instructions      Home Living Family/patient expects to be discharged to:: Private residence Living Arrangements: Spouse/significant other;Children Available Help at Discharge: Family Type of Home: House Home Access: Stairs to enter Technical brewer of Steps: 1 Entrance Stairs-Rails: Left Home Layout: Two level;Able to live on main level with bedroom/bathroom     Bathroom Shower/Tub: Teacher, early years/pre: Standard Bathroom Accessibility: Yes How Accessible: Accessible via walker Home Equipment: Juda - single point;Walker - 2 wheels;Shower seat;Bedside commode          Prior Functioning/Environment Level of Independence: Independent with assistive device(s)        Comments: pt reports he ususally walks with rollator or RW, sometimes does not use device depending on the day.  Per PT eval.        OT Problem List: Decreased strength;Increased edema;Decreased knowledge of precautions;Decreased activity tolerance;Decreased safety awareness;Impaired balance (sitting and/or standing);Decreased knowledge of use of DME or AE      OT Treatment/Interventions: Self-care/ADL training;Therapeutic activities;DME and/or AE instruction;Balance training;Cognitive remediation/compensation    OT Goals(Current goals can be found in the care plan section) Acute Rehab OT Goals Patient Stated  Goal: none stated OT Goal Formulation: Patient unable to participate in goal setting Time For Goal Achievement: 06/30/21 Potential to Achieve Goals: Good ADL Goals Pt Will Perform Grooming: with supervision;standing Pt Will Perform Lower Body Bathing: with supervision;sit to/from stand Pt Will Perform Lower Body Dressing: with supervision;sit to/from stand Pt Will Transfer to Toilet: with supervision;ambulating;regular height toilet Pt Will Perform Toileting - Clothing Manipulation and hygiene: with supervision;sit to/from stand  OT Frequency: Min 2X/week   Barriers to D/C:  None noted          Co-evaluation              AM-PAC OT "6 Clicks" Daily Activity     Outcome Measure Help from another person eating meals?: A Little Help from another person taking care of personal grooming?: A Little Help from another person toileting, which includes using toliet, bedpan, or urinal?: A Lot Help from another person bathing (including washing, rinsing, drying)?: A Lot Help from another person to put on and taking off regular upper body clothing?: A Little Help from another person to put on and taking off regular lower body clothing?: A Lot 6 Click Score: 15   End of Session Equipment Utilized During Treatment: Rolling walker  Activity Tolerance: Patient limited by lethargy Patient left: in chair;with call bell/phone within reach;with chair alarm set  OT Visit Diagnosis: Unsteadiness on feet (R26.81);Other symptoms and signs involving cognitive function  Time: 3833-3832 OT Time Calculation (min): 19 min Charges:  OT General Charges $OT Visit: 1 Visit OT Evaluation $OT Eval Moderate Complexity: 1 Mod  06/16/2021  RP, OTR/L  Acute Rehabilitation Services  Office:  [place] 06/16/2021, 5:11 PM

## 2021-06-17 ENCOUNTER — Other Ambulatory Visit: Payer: Self-pay | Admitting: Physician Assistant

## 2021-06-17 DIAGNOSIS — C220 Liver cell carcinoma: Secondary | ICD-10-CM | POA: Diagnosis not present

## 2021-06-17 DIAGNOSIS — N179 Acute kidney failure, unspecified: Secondary | ICD-10-CM | POA: Diagnosis not present

## 2021-06-17 DIAGNOSIS — K7031 Alcoholic cirrhosis of liver with ascites: Secondary | ICD-10-CM | POA: Diagnosis not present

## 2021-06-17 DIAGNOSIS — R69 Illness, unspecified: Secondary | ICD-10-CM | POA: Diagnosis not present

## 2021-06-17 DIAGNOSIS — E118 Type 2 diabetes mellitus with unspecified complications: Secondary | ICD-10-CM | POA: Diagnosis not present

## 2021-06-17 DIAGNOSIS — K7682 Hepatic encephalopathy: Secondary | ICD-10-CM | POA: Diagnosis not present

## 2021-06-17 LAB — CBC WITH DIFFERENTIAL/PLATELET
Abs Immature Granulocytes: 0.01 10*3/uL (ref 0.00–0.07)
Basophils Absolute: 0 10*3/uL (ref 0.0–0.1)
Basophils Relative: 0 %
Eosinophils Absolute: 0.1 10*3/uL (ref 0.0–0.5)
Eosinophils Relative: 4 %
HCT: 32.2 % — ABNORMAL LOW (ref 39.0–52.0)
Hemoglobin: 9.5 g/dL — ABNORMAL LOW (ref 13.0–17.0)
Immature Granulocytes: 0 %
Lymphocytes Relative: 20 %
Lymphs Abs: 0.5 10*3/uL — ABNORMAL LOW (ref 0.7–4.0)
MCH: 25.5 pg — ABNORMAL LOW (ref 26.0–34.0)
MCHC: 29.5 g/dL — ABNORMAL LOW (ref 30.0–36.0)
MCV: 86.6 fL (ref 80.0–100.0)
Monocytes Absolute: 0.3 10*3/uL (ref 0.1–1.0)
Monocytes Relative: 10 %
Neutro Abs: 1.7 10*3/uL (ref 1.7–7.7)
Neutrophils Relative %: 66 %
Platelets: 116 10*3/uL — ABNORMAL LOW (ref 150–400)
RBC: 3.72 MIL/uL — ABNORMAL LOW (ref 4.22–5.81)
RDW: 19.7 % — ABNORMAL HIGH (ref 11.5–15.5)
WBC: 2.6 10*3/uL — ABNORMAL LOW (ref 4.0–10.5)
nRBC: 0 % (ref 0.0–0.2)

## 2021-06-17 LAB — COMPREHENSIVE METABOLIC PANEL
ALT: 44 U/L (ref 0–44)
AST: 63 U/L — ABNORMAL HIGH (ref 15–41)
Albumin: 2.4 g/dL — ABNORMAL LOW (ref 3.5–5.0)
Alkaline Phosphatase: 356 U/L — ABNORMAL HIGH (ref 38–126)
Anion gap: 9 (ref 5–15)
BUN: 31 mg/dL — ABNORMAL HIGH (ref 8–23)
CO2: 22 mmol/L (ref 22–32)
Calcium: 9 mg/dL (ref 8.9–10.3)
Chloride: 108 mmol/L (ref 98–111)
Creatinine, Ser: 1.22 mg/dL (ref 0.61–1.24)
GFR, Estimated: >60 mL/min (ref >60)
Glucose, Bld: 101 mg/dL — ABNORMAL HIGH (ref 70–99)
Potassium: 4.4 mmol/L (ref 3.5–5.1)
Sodium: 139 mmol/L (ref 135–145)
Total Bilirubin: 1.3 mg/dL — ABNORMAL HIGH (ref 0.3–1.2)
Total Protein: 5.8 g/dL — ABNORMAL LOW (ref 6.5–8.1)

## 2021-06-17 LAB — MAGNESIUM: Magnesium: 2.1 mg/dL (ref 1.7–2.4)

## 2021-06-17 LAB — GLUCOSE, CAPILLARY
Glucose-Capillary: 85 mg/dL (ref 70–99)
Glucose-Capillary: 120 mg/dL — ABNORMAL HIGH (ref 70–99)
Glucose-Capillary: 117 mg/dL — ABNORMAL HIGH (ref 70–99)
Glucose-Capillary: 114 mg/dL — ABNORMAL HIGH (ref 70–99)

## 2021-06-17 LAB — AMMONIA: Ammonia: 105 umol/L — ABNORMAL HIGH (ref 9–35)

## 2021-06-17 NOTE — Plan of Care (Signed)
  Problem: Education: Goal: Knowledge of General Education information will improve Description: Including pain rating scale, medication(s)/side effects and non-pharmacologic comfort measures Outcome: Progressing   Problem: Health Behavior/Discharge Planning: Goal: Ability to manage health-related needs will improve Outcome: Progressing   Problem: Clinical Measurements: Goal: Ability to maintain clinical measurements within normal limits will improve Outcome: Progressing Goal: Will remain free from infection Outcome: Progressing Goal: Diagnostic test results will improve Outcome: Progressing Goal: Respiratory complications will improve Outcome: Progressing Goal: Cardiovascular complication will be avoided Outcome: Progressing   Problem: Activity: Goal: Risk for activity intolerance will decrease Outcome: Progressing   Problem: Nutrition: Goal: Adequate nutrition will be maintained Outcome: Progressing   Problem: Coping: Goal: Level of anxiety will decrease Outcome: Progressing   Problem: Elimination: Goal: Will not experience complications related to bowel motility Outcome: Progressing Goal: Will not experience complications related to urinary retention Outcome: Progressing   Problem: Pain Managment: Goal: General experience of comfort will improve Outcome: Progressing   Problem: Safety: Goal: Ability to remain free from injury will improve Outcome: Progressing   Problem: Skin Integrity: Goal: Risk for impaired skin integrity will decrease Outcome: Progressing   Report received and care assumed from previous shift. Progressing towards goals as outlined above. VS obtained, shift assessments completed - see flowsheets. Denies pain. Ensured patient turned and repositioned q2hr. Assisted to bedpan x 2 for soft/loose, dark brown stools. OOB to [facility] x1 with standby assist and walker, steady/shuffling gait, tolerated activity well. Peri/incontinent care provided as needed.  Currently resting in bed, bed in lowest position. Denies needs. Call bell within reach. Bedalarm in use at all times.

## 2021-06-17 NOTE — Progress Notes (Signed)
PT Cancellation Note  Patient Details Name: [name] MRN: [id number] DOB: 01-May-1946   Cancelled Treatment:    Reason Eval/Treat Not Completed: Patient declined, no reason specified.  Refused x 2 for fatigue and poor sleep.  Will retry as time and pt allow.   Ramond Dial 06/17/2021, 4:08 PM  Mee Hives, PT PhD Acute Rehab Dept. Number: [place]

## 2021-06-17 NOTE — Progress Notes (Addendum)
PROGRESS NOTE        PATIENT DETAILS Name: [name] Age: 75 y.o. Sex: male Date of Birth: [birth year] Admit Date: 06/15/2021 Admitting Physician Etta Quill, DO IFO:YDXAJOIN, Real Cons, MD  Brief Narrative: Patient is a 75 y.o. male with history of liver cirrhosis, hepatocellular carcinoma,hx of GI bleeding-s/p TIPS and embolization of gastric varices by IR in May 8676, chronic diastolic heart failure, OSA not on CPAP-presenting with altered mental status-found to have acute hepatic encephalopathy  Subjective: Looks very weak and frail-but awake-alert-still somewhat slow to respond.  Objective: Vitals: Blood pressure (!) 142/65, pulse 73, temperature 97.7 F (36.5 C), resp. rate 16, height 5' 7.99" (1.727 m), weight 79.3 kg, SpO2 100 %.   Exam: Gen Exam: Slow to respond but awake and alert HEENT:atraumatic, normocephalic Chest: B/L clear to auscultation anteriorly CVS:S1S2 regular Abdomen:soft non tender, non distended Extremities:+ edema Neurology: Non focal Skin: no rash  Pertinent Labs/Radiology: WBC: 2.6 Hb: 9.5 PLT: 116 Na: 139 K: 4.4 Creatinine: 1.22  Assessment/Plan: Acute hepatic encephalopathy: Slowly improving-although seems to be much better than yesterday-still somewhat slow to respond.  Does not appear to be back to his usual baseline yet.  Continue lactulose/rifaximin.  AKI: Likely hemodynamically mediated-has improved with supportive care-stop all IV fluids.  History of liver cirrhosis: Relatively well compensated with the exception of resolving encephalopathy.  We will resume diuretics tomorrow if renal function continues to be stable.  History of hepatocellular carcinoma-s/p Y 90 treatment and coil embolization: Resume outpatient follow-up with IR on discharge.  Normocytic anemia: Due to chronic disease-no evidence of ongoing acute blood loss.  Thrombocytopenia: Likely due to hypersplenism in the setting of liver  cirrhosis.  Platelet counts close to baseline-monitor periodically.  CAD: No anginal symptoms-not on aspirin due to recurrent bleeding and thrombocytopenia.   COPD: No wheezing-stable-continue bronchodilators   DM-1: CBG stable with SSI-resume usual diabetic regimen on discharge.  Recent Labs    06/16/21 2111 06/17/21 0701 06/17/21 1123  GLUCAP 132* 85 120*    Debility/deconditioning/mechanical fall: Suspect significant amount of debility at baseline-seems to have worsened due to acute illness-await further eval with therapy services before consideration of discharge-as he appears significantly weak.   Procedures: None Consults: None DVT Prophylaxis: SCD's Code Status:Full code  Family Communication: [phone] on 10/20  Time spent: 25 minutes-Greater than 50% of this time was spent in counseling, explanation of diagnosis, planning of further management, and coordination of care.  Diet: Diet Order             Diet heart healthy/carb modified Room service appropriate? Yes; Fluid consistency: Thin  Diet effective now                      Disposition Plan: Status is: Observation  The patient will require care spanning > 2 midnights and should be moved to inpatient because: Requires inpatient level of treatment.    Barriers to Discharge: Appears significantly weak-fell at home-needs physical therapy evaluation/follow-up before consideration of discharge for safe disposition.  Antimicrobial agents: Anti-infectives (From admission, onward)    Start     Dose/Rate Route Frequency Ordered Stop   06/15/21 2200  rifaximin (XIFAXAN) tablet 550 mg        550 mg Oral 2 times daily 06/15/21 2026          MEDICATIONS: Scheduled Meds:  ferrous sulfate  325 mg Oral Q breakfast   insulin aspart  0-9 Units Subcutaneous TID WC   lactulose  30 g Oral TID   pantoprazole  40 mg Oral BID AC   rifaximin  550 mg Oral BID   umeclidinium-vilanterol  1 puff  Inhalation Daily   Continuous Infusions:  lactated ringers 10 mL/hr at 06/17/21 0800   PRN Meds:.albuterol, ondansetron **OR** ondansetron (ZOFRAN) IV   I have personally reviewed following labs and imaging studies  LABORATORY DATA: CBC: Recent Labs  Lab 06/15/21 1500 06/16/21 0259 06/17/21 0302  WBC 5.8 5.5 2.6*  NEUTROABS 4.3  --  1.7  HGB 11.6* 10.2* 9.5*  HCT 39.7 34.8* 32.2*  MCV 87.4 87.2 86.6  PLT 186 145* 116*    Basic Metabolic Panel: Recent Labs  Lab 06/15/21 1500 06/16/21 0259 06/17/21 0302  NA 141 136 139  K 3.9 3.4* 4.4  CL 108 107 108  CO2 21* 21* 22  GLUCOSE 144* 114* 101*  BUN 42* 42* 31*  CREATININE 1.88* 1.78* 1.22  CALCIUM 9.6 8.7* 9.0  MG  --   --  2.1    GFR: Estimated Creatinine Clearance: 50.6 mL/min (by C-G formula based on SCr of 1.22 mg/dL).  Liver Function Tests: Recent Labs  Lab 06/15/21 1500 06/16/21 0259 06/17/21 0302  AST 57* 43* 63*  ALT 40 34 44  ALKPHOS 349* 292* 356*  BILITOT 1.5* 1.4* 1.3*  PROT 7.6 6.4* 5.8*  ALBUMIN 3.2* 2.8* 2.4*   No results for input(s): LIPASE, AMYLASE in the last 168 hours. Recent Labs  Lab 06/15/21 1445 06/17/21 0302  AMMONIA 81* 105*    Coagulation Profile: Recent Labs  Lab 06/15/21 1500  INR 1.3*    Cardiac Enzymes: No results for input(s): CKTOTAL, CKMB, CKMBINDEX, TROPONINI in the last 168 hours.  BNP (last 3 results) No results for input(s): PROBNP in the last 8760 hours.  Lipid Profile: No results for input(s): CHOL, HDL, LDLCALC, TRIG, CHOLHDL, LDLDIRECT in the last 72 hours.  Thyroid Function Tests: No results for input(s): TSH, T4TOTAL, FREET4, T3FREE, THYROIDAB in the last 72 hours.  Anemia Panel: No results for input(s): VITAMINB12, FOLATE, FERRITIN, TIBC, IRON, RETICCTPCT in the last 72 hours.  Urine analysis:    Component Value Date/Time   COLORURINE YELLOW 05/28/2021 0632   APPEARANCEUR CLEAR 05/28/2021 0632   LABSPEC 1.009 05/28/2021 0632   PHURINE  7.0 05/28/2021 0632   GLUCOSEU NEGATIVE 05/28/2021 0632   HGBUR SMALL (A) 05/28/2021 0632   BILIRUBINUR NEGATIVE 05/28/2021 0632   KETONESUR NEGATIVE 05/28/2021 0632   PROTEINUR NEGATIVE 05/28/2021 0632   UROBILINOGEN 0.2 03/29/2014 1546   NITRITE NEGATIVE 05/28/2021 0632   LEUKOCYTESUR TRACE (A) 05/28/2021 0632    Sepsis Labs: Lactic Acid, Venous    Component Value Date/Time   LATICACIDVEN 1.5 03/28/2021 1714    MICROBIOLOGY: No results found for this or any previous visit (from the past 240 hour(s)).  RADIOLOGY STUDIES/RESULTS: No results found.   LOS: 0 days   Oren Binet, MD  [facility]    To contact the attending provider between 7A-7P or the covering provider during after hours 7P-7A, please log into the web site [email or web address] and access using universal [place] password for that web site. If you do not have the password, please call the hospital operator.  06/17/2021, 12:42 PM

## 2021-06-18 ENCOUNTER — Ambulatory Visit: Payer: Medicare HMO | Admitting: Physician Assistant

## 2021-06-18 ENCOUNTER — Other Ambulatory Visit (HOSPITAL_COMMUNITY): Payer: Self-pay

## 2021-06-18 DIAGNOSIS — C220 Liver cell carcinoma: Secondary | ICD-10-CM | POA: Diagnosis not present

## 2021-06-18 DIAGNOSIS — R69 Illness, unspecified: Secondary | ICD-10-CM | POA: Diagnosis not present

## 2021-06-18 DIAGNOSIS — K7682 Hepatic encephalopathy: Secondary | ICD-10-CM | POA: Diagnosis not present

## 2021-06-18 DIAGNOSIS — N179 Acute kidney failure, unspecified: Secondary | ICD-10-CM | POA: Diagnosis not present

## 2021-06-18 DIAGNOSIS — E118 Type 2 diabetes mellitus with unspecified complications: Secondary | ICD-10-CM | POA: Diagnosis not present

## 2021-06-18 LAB — COMPREHENSIVE METABOLIC PANEL
ALT: 39 U/L (ref 0–44)
AST: 58 U/L — ABNORMAL HIGH (ref 15–41)
Albumin: 2.2 g/dL — ABNORMAL LOW (ref 3.5–5.0)
Alkaline Phosphatase: 336 U/L — ABNORMAL HIGH (ref 38–126)
Anion gap: 6 (ref 5–15)
BUN: 20 mg/dL (ref 8–23)
CO2: 22 mmol/L (ref 22–32)
Calcium: 8.6 mg/dL — ABNORMAL LOW (ref 8.9–10.3)
Chloride: 106 mmol/L (ref 98–111)
Creatinine, Ser: 1.13 mg/dL (ref 0.61–1.24)
GFR, Estimated: >60 mL/min (ref >60)
Glucose, Bld: 103 mg/dL — ABNORMAL HIGH (ref 70–99)
Potassium: 3.7 mmol/L (ref 3.5–5.1)
Sodium: 134 mmol/L — ABNORMAL LOW (ref 135–145)
Total Bilirubin: 1.1 mg/dL (ref 0.3–1.2)
Total Protein: 5.2 g/dL — ABNORMAL LOW (ref 6.5–8.1)

## 2021-06-18 LAB — GLUCOSE, CAPILLARY
Glucose-Capillary: 98 mg/dL (ref 70–99)
Glucose-Capillary: 113 mg/dL — ABNORMAL HIGH (ref 70–99)

## 2021-06-18 LAB — MAGNESIUM: Magnesium: 1.9 mg/dL (ref 1.7–2.4)

## 2021-06-18 MED ORDER — FUROSEMIDE 20 MG PO TABS
20.0000 mg | ORAL_TABLET | Freq: Every day | ORAL | Status: DC
  Administered 2021-06-18: 20 mg via ORAL
  Filled 2021-06-18: qty 1

## 2021-06-18 MED ORDER — PANTOPRAZOLE SODIUM 40 MG PO TBEC: 40.0000 mg | DELAYED_RELEASE_TABLET | Freq: Two times a day (BID) | ORAL | Status: DC

## 2021-06-18 MED ORDER — SPIRONOLACTONE 25 MG PO TABS
50.0000 mg | ORAL_TABLET | Freq: Every day | ORAL | Status: DC
  Administered 2021-06-18: 50 mg via ORAL
  Filled 2021-06-18: qty 2

## 2021-06-18 MED ORDER — RIFAXIMIN 550 MG PO TABS
550.0000 mg | ORAL_TABLET | Freq: Two times a day (BID) | ORAL | 1 refills | Status: DC
  Filled 2021-06-18: qty 60, 30d supply, fill #0

## 2021-06-18 NOTE — TOC Benefit Eligibility Note (Signed)
Patient Advocate Encounter  Prior Authorization for Xifaxan 550 mg tablets has been approved.    PA# [id number] Effective dates: 06/18/2021 through 12/15/2021      Lyndel Safe, [place] Patient Advocate Specialist [place] Antimicrobial Stewardship Team Direct Number: [phone]  Fax: [phone]

## 2021-06-18 NOTE — Progress Notes (Signed)
Pt for d/c today per MD. Pt requiring supervision/Assistance - 24 hour. Wife currently hospitalized. NCM has called son twice this am to make aware of d/c , left voice message.Marland KitchenMarland Kitchen..awaiting response. Jahlen Bollman (Son)      [phone]     [facility] team will continue to monitor and assist with TOC needs.... Whitman Hero RN,BSN,CM [phone]

## 2021-06-18 NOTE — Plan of Care (Signed)
  Problem: Education: Goal: Knowledge of General Education information will improve Description: Including pain rating scale, medication(s)/side effects and non-pharmacologic comfort measures Outcome: Progressing   Problem: Health Behavior/Discharge Planning: Goal: Ability to manage health-related needs will improve Outcome: Progressing   Problem: Clinical Measurements: Goal: Ability to maintain clinical measurements within normal limits will improve Outcome: Progressing Goal: Will remain free from infection Outcome: Progressing Goal: Diagnostic test results will improve Outcome: Progressing Goal: Respiratory complications will improve Outcome: Progressing Goal: Cardiovascular complication will be avoided Outcome: Progressing   Problem: Activity: Goal: Risk for activity intolerance will decrease Outcome: Progressing   Problem: Nutrition: Goal: Adequate nutrition will be maintained Outcome: Progressing   Problem: Coping: Goal: Level of anxiety will decrease Outcome: Progressing   Problem: Elimination: Goal: Will not experience complications related to bowel motility Outcome: Progressing Goal: Will not experience complications related to urinary retention Outcome: Progressing   Problem: Pain Managment: Goal: General experience of comfort will improve Outcome: Progressing   Problem: Safety: Goal: Ability to remain free from injury will improve Outcome: Progressing   Problem: Skin Integrity: Goal: Risk for impaired skin integrity will decrease Outcome: Progressing   Report received and care assumed from previous shift RN. Progressing towards goals as outlined above. VS obtained, shift assessments completed - see flowsheets. Denies pain. Turns and repositions self in bed. Currently resting in bed, bed in lowest position. Denies needs. Call bell within reach. Bedalarm in use at all times.

## 2021-06-18 NOTE — TOC Transition Note (Signed)
Transition of Care [facility]) - CM/SW Discharge Note   Patient Details  Name: [name] MRN: [id number] Date of Birth: 1945/10/13  [facility] CM/SW Contact:  Sharin Mons, RN Phone Number: 06/18/2021, 1:58 PM   Clinical Narrative:    Patient will DC to: home Anticipated DC date: 06/18/2021 Family notified: yes Transport by: car  Admitted with generalized weakness, encephalopathy Pt from home with wife, son and granddaughter.  Per MD patient ready for DC today.RN, patient, and patient's son [name] notified of DC.    Per PT's recommendations:Home health PT;Supervision/Assistance - 24 hour. Son states family to provide 24/7 supervision / assistance needed @ d/c. Pt agreeable to home health services. Pt without preference. Referral made with [facility] and accepted, start of care Sunday (10/23).  TOC pharmacy to provide pt with Rx meds @ bedside prior to d/c.  Pt without DME needs, already has R.W. Post hospital f/u noted on AVS.  Son to provide transportation to home.    RNCM will sign off for now as intervention is no longer needed. Please consult us again if new needs arise.  [name] Moger (Son)      [phone]      Final next level of care: [place] Barriers to Discharge: No Barriers Identified   Patient Goals and CMS Choice     Choice offered to / list presented to : Patient  Discharge Placement    Discharge Plan and Services      HH Arranged: RN, PT, OT, Social Work [facility] Agency: [place] Date [place]: 06/18/21 Time [place]: 6659 Representative spoke with at [place]: [place] ([place]) Interventions     Readmission Risk Interventions Readmission Risk Prevention Plan 01/26/2021 08/12/2020 08/12/2020  Transportation Screening Complete - Complete  PCP or Specialist Appt within 5-7 Days - - -  Not Complete comments - - -  PCP or Specialist Appt  within 3-5 Days - Complete -  Home Care Screening - - -  Medication Review (RN CM) - - -  [place] or [place] - - Complete  Social Work Consult for [place] Planning/Counseling - - Complete  Palliative Care Screening - - Not Applicable  Medication Review Press photographer) Complete - -  PCP or Specialist appointment within 3-5 days of discharge Complete - -  [place] or [place] Complete - -  SW Recovery Care/Counseling Consult Complete - -  Palliative Care Screening Not Applicable - -  [place] Patient Refused - -  Some recent data might be hidden

## 2021-06-18 NOTE — Progress Notes (Signed)
Physical Therapy Treatment Patient Details Name: [name] MRN: [id number] DOB: 1946-04-22 Today's Date: 06/18/2021   History of Present Illness Pt presents to ED with c/o generalized weakness, hepatic encephalopathy;  has a past medical history of Arthritis, Carcinoma of liver, hepatocellular ([place]) (06/30/2020), Chronic lower back pain, Coronary artery disease, Diastolic heart failure ([place]), GERD without esophagitis (08/04/2010), Hepatic cirrhosis ([place]), History of blood transfusion, History of concussion, History of GI bleed, gout, Iron deficiency anemia, OA (osteoarthritis of spine), Phimosis, Type 2 diabetes mellitus    PT Comments    Pt admitted with above diagnosis. Pt was able to ambulate and incr distance. Still needs min assist and cues for stability. _Plan is for home with assist.  Pt progressing overall.  Pt currently with functional limitations due to balance and endurance deficits.  Pt will benefit from skilled PT to increase their independence and safety with mobility to allow discharge to the venue listed below.      Recommendations for follow up therapy are one component of a multi-disciplinary discharge planning process, led by the attending physician.  Recommendations may be updated based on patient status, additional functional criteria and insurance authorization.  Follow Up Recommendations  Home health PT;Supervision/Assistance - 24 hour     Equipment Recommendations  Rolling walker with 5" wheels (May already have one)    Recommendations for Other Services       Precautions / Restrictions Precautions Precautions: Fall Restrictions Weight Bearing Restrictions: No     Mobility  Bed Mobility Overal bed mobility: Needs Assistance Bed Mobility: Supine to Sit     Supine to sit: Min assist     General bed mobility comments: Moves extremely slowly    Transfers Overall transfer level: Needs assistance Equipment used: Rolling walker (2 wheeled) Transfers:  Sit to/from [facility] Sit to Stand: Min assist Stand pivot transfers: Min assist       General transfer comment: Slow moving, cues for hand placement and assist to power up.  Ambulation/Gait Ambulation/Gait assistance: Min assist;+2 safety/equipment Gait Distance (Feet): 75 Feet Assistive device: Rolling walker (2 wheeled) Gait Pattern/deviations: Decreased step length - right;Decreased step length - left;Festinating;Trunk flexed;Wide base of support;Drifts right/left   Gait velocity interpretation: <1.31 ft/sec, indicative of household ambulator General Gait Details: initially very short steps, wide stance, with almost festinating pattern; tactile cues for weight shifting fully onto stance LE and allow for better step length.  Pt needed assist and cues to steer RW throughout.   Stairs             Wheelchair Mobility    Modified Rankin (Stroke Patients Only)       Balance Overall balance assessment: Needs assistance Sitting-balance support: Feet supported Sitting balance-Leahy Scale: Fair     Standing balance support: Bilateral upper extremity supported Standing balance-Leahy Scale: Poor Standing balance comment: relies on UE and external support                            Cognition Arousal/Alertness: Awake/alert Behavior During Therapy: Flat affect Overall Cognitive Status: No family/caregiver present to determine baseline cognitive functioning Area of Impairment: Following commands;Safety/judgement;Problem solving;Memory                     Memory: Decreased short-term memory Following Commands: Follows one step commands with increased time Safety/Judgement: Decreased awareness of deficits   Problem Solving: Slow processing;Difficulty sequencing;Requires verbal cues;Requires tactile cues  Exercises General Exercises - Lower Extremity Long Arc Quad: AROM;Both;10 reps;Seated    General Comments         Pertinent Vitals/Pain Pain Assessment: No/denies pain    Home Living                      Prior Function            PT Goals (current goals can now be found in the care plan section) Acute Rehab PT Goals Patient Stated Goal: none stated PT Goal Formulation: With patient Time For Goal Achievement: 06/30/21 Potential to Achieve Goals: Fair Progress towards PT goals: Progressing toward goals    Frequency    Min 3X/week      PT Plan Current plan remains appropriate    Co-evaluation              AM-PAC PT "6 Clicks" Mobility   Outcome Measure  Help needed turning from your back to your side while in a flat bed without using bedrails?: A Little Help needed moving from lying on your back to sitting on the side of a flat bed without using bedrails?: A Little Help needed moving to and from a bed to a chair (including a wheelchair)?: A Little Help needed standing up from a chair using your arms (e.g., wheelchair or bedside chair)?: A Little Help needed to walk in hospital room?: A Lot Help needed climbing 3-5 steps with a railing? : A Lot 6 Click Score: 16    End of Session Equipment Utilized During Treatment: Gait belt Activity Tolerance: Patient tolerated treatment well Patient left: in chair;with call bell/phone within reach;with chair alarm set Nurse Communication: Mobility status PT Visit Diagnosis: Difficulty in walking, not elsewhere classified (R26.2);Muscle weakness (generalized) (M62.81)     Time: 4650-3546 PT Time Calculation (min) (ACUTE ONLY): 14 min  Charges:  $Gait Training: 8-22 mins                     Valen Mascaro M,PT Acute Rehab Services [phone] (pager)    Alvira Philips 06/18/2021, 3:04 PM

## 2021-06-18 NOTE — Discharge Summary (Addendum)
PATIENT DETAILS Name: [name] Age: 75 y.o. Sex: male Date of Birth: 1946/06/11 MRN: [id number]. Admitting Physician: Etta Quill, DO FWY:OVZCHYIF, Real Cons, MD  Admit Date: 06/15/2021 Discharge date: 06/18/2021  Recommendations for Outpatient Follow-up:  Follow up with PCP in 1-2 weeks Please obtain CMP/CBC in one week Consider palliative care referral in the outpatient setting.  Admitted From:  Home  Disposition: Home with home health services   [place]: Yes  Equipment/Devices: None  Discharge Condition: Stable  CODE STATUS: FULL CODE  Diet recommendation:  Diet Order             Diet - low sodium heart healthy           Diet heart healthy/carb modified Room service appropriate? Yes; Fluid consistency: Thin  Diet effective now                    Brief Summary: Patient is a 75 y.o. male with history of liver cirrhosis, hepatocellular carcinoma,hx of GI bleeding-s/p TIPS and embolization of gastric varices by IR in May 0277, chronic diastolic heart failure, OSA not on CPAP-presenting with altered mental status-found to have acute hepatic encephalopathy  Brief Hospital Course: Acute hepatic encephalopathy: Slowly improved-he is much more fluent and more awake and alert compared to yesterday.  Treated with lactulose and rifaximin-which will be continued on discharge.  This has been a recurrent issue for the patient-claims he is compliant with his medications.  He may need outpatient referral to palliative care.   AKI: Likely hemodynamically mediated-has improved with supportive care-no longer on IVF.   History of liver cirrhosis: Very well compensated-encephalopathy has improved.  Plan is to resume diuretics today.  Follow-up with PCP/primary gastroenterologist-May need evaluation by palliative care in the outpatient setting.    History of hepatocellular carcinoma-s/p Y 90 treatment and coil embolization: Resume outpatient follow-up with IR  on discharge.   Normocytic anemia: Due to chronic disease-no evidence of ongoing acute blood loss.   Thrombocytopenia: Likely due to hypersplenism in the setting of liver cirrhosis.  Platelet counts close to baseline-monitor periodically.   CAD: No anginal symptoms-not on aspirin due to recurrent bleeding and thrombocytopenia.   COPD: No wheezing-stable-continue bronchodilators   DM-2: CBG stable with SSI-resume usual diabetic regimen on discharge.   Procedures None  Discharge Diagnoses:  Principal Problem:   Acute hepatic encephalopathy Active Problems:   Type 2 diabetes with complication (HCC)   AKI (acute kidney injury) ([place])   Alcoholic cirrhosis of liver with ascites (HCC)   Iron deficiency anemia due to chronic blood loss   Hepatocellular carcinoma (HCC)   GI bleeding   Discharge Instructions:  Activity:  As tolerated with Full fall precautions use walker/cane & assistance as needed  Discharge Instructions     Call MD for:   Complete by: As directed    Altered mental status.   Call MD for:  extreme fatigue   Complete by: As directed    Diet - low sodium heart healthy   Complete by: As directed    Discharge instructions   Complete by: As directed    Follow with Primary MD  [name], MD in 1-2 weeks  Titrate your dosing of lactulose to have at least 3-4 bowel movements on a daily basis.  Please get a complete blood count and chemistry panel checked by your Primary MD at your next visit, and again as instructed by your Primary MD.  Get Medicines reviewed and adjusted:  Please take all your medications with you for your next visit with your Primary MD  Laboratory/radiological data: Please request your Primary MD to go over all hospital tests and procedure/radiological results at the follow up, please ask your Primary MD to get all Hospital records sent to his/her office.  In some cases, they will be blood work, cultures and biopsy results pending  at the time of your discharge. Please request that your primary care M.D. follows up on these results.  Also Note the following: If you experience worsening of your admission symptoms, develop shortness of breath, life threatening emergency, suicidal or homicidal thoughts you must seek medical attention immediately by calling 911 or calling your MD immediately  if symptoms less severe.  You must read complete instructions/literature along with all the possible adverse reactions/side effects for all the Medicines you take and that have been prescribed to you. Take any new Medicines after you have completely understood and accpet all the possible adverse reactions/side effects.   Do not drive when taking Pain medications or sleeping medications (Benzodaizepines)  Do not take more than prescribed Pain, Sleep and Anxiety Medications. It is not advisable to combine anxiety,sleep and pain medications without talking with your primary care practitioner  Special Instructions: If you have smoked or chewed Tobacco  in the last 2 yrs please stop smoking, stop any regular Alcohol  and or any Recreational drug use.  Wear Seat belts while driving.  Please note: You were cared for by a hospitalist during your hospital stay. Once you are discharged, your primary care physician will handle any further medical issues. Please note that NO REFILLS for any discharge medications will be authorized once you are discharged, as it is imperative that you return to your primary care physician (or establish a relationship with a primary care physician if you do not have one) for your post hospital discharge needs so that they can reassess your need for medications and monitor your lab values.   Increase activity slowly   Complete by: As directed       Allergies as of 06/18/2021       Reactions   Fluzone Quadrivalent [influenza Vac Split Quad] Other (See Comments)   The patient stated, in 10/2020: "I am not taking any  more flu shots. It liked to have killed me."        Medication List     STOP taking these medications    multivitamin with minerals Tabs tablet       TAKE these medications    Accu-Chek Softclix Lancets lancets USE AS DIRECTED TO TEST BLOOD SUGAR FOUR TIMES DAILY   albuterol 108 (90 Base) MCG/ACT inhaler Commonly known as: VENTOLIN HFA Inhale 2 puffs into the lungs every 6 (six) hours as needed for wheezing or shortness of breath.   Anoro Ellipta 62.5-25 MCG/ACT Aepb Generic drug: umeclidinium-vilanterol Inhale 1 puff into the lungs daily.   blood glucose meter kit and supplies Dispense based on patient and insurance preference. Use up to four times daily as directed. (FOR ICD-10 E10.9, E11.9).   feeding supplement Liqd Take 237 mLs by mouth 2 (two) times daily between meals.   ferrous sulfate 325 (65 FE) MG tablet Take 1 tablet (325 mg total) by mouth daily with breakfast.   furosemide 40 MG tablet Commonly known as: LASIX Take 1 tablet (40 mg total) by mouth daily.   lactulose 10 GM/15ML solution Commonly known as: CHRONULAC Take 45 mLs (30 g total) by mouth 3 (  three) times daily.   pantoprazole 40 MG tablet Commonly known as: PROTONIX Take 1 tablet (40 mg total) by mouth 2 (two) times daily before a meal.   spironolactone 50 MG tablet Commonly known as: ALDACTONE Take 1 tablet (50 mg total) by mouth daily.   Xifaxan 550 MG Tabs tablet Generic drug: rifaximin Take 1 tablet (550 mg total) by mouth 2 (two) times daily.        Follow-up Information     [name], MD. Schedule an appointment as soon as possible for a visit in 1 week(s).   Specialty: Internal Medicine Contact information: [place] [address] [phone]         Nahser, Wonda Cheng, MD Follow up in 1 month(s).   Specialty: Cardiology Contact information: [address] [place] [address] [phone]         [place],  [place] Follow up.   Specialty: Home Health Services Contact information: [place] [address] [phone]                Allergies  Allergen Reactions   Fluzone Quadrivalent [Influenza Vac Split Quad] Other (See Comments)    The patient stated, in 10/2020: "I am not taking any more flu shots. It liked to have killed me."    Consultations: None   Other Procedures/Studies: CT HEAD WO CONTRAST (5MM)  Result Date: 05/28/2021 CLINICAL DATA:  Altered mental status, hepatic failure EXAM: CT HEAD WITHOUT CONTRAST TECHNIQUE: Contiguous axial images were obtained from the base of the skull through the vertex without intravenous contrast. COMPARISON:  01/22/2021 FINDINGS: Brain: Normal anatomic configuration. Parenchymal volume loss is commensurate with the patient's age. Moderate subcortical and periventricular white matter changes are present likely reflecting the sequela of small vessel ischemia. No abnormal intra or extra-axial mass lesion or fluid collection. No abnormal mass effect or midline shift. No evidence of acute intracranial hemorrhage or cortical infarct. Ventricular size is normal. Cerebellum unremarkable. Vascular: No asymmetric hyperdense vasculature at the skull base. Skull: Intact Sinuses/Orbits: Paranasal sinuses are clear. Orbits are unremarkable. Other: Mastoid air cells and middle ear cavities are clear. IMPRESSION: No acute intracranial abnormality. Moderate white matter change, likely stable since prior examination when accounting for technical differences and likely the sequela of small vessel ischemia. Electronically Signed   By: Fidela Salisbury M.D.   On: 05/28/2021 04:18   DG Chest Portable 1 View  Result Date: 05/28/2021 CLINICAL DATA:  Altered mental status EXAM: PORTABLE CHEST 1 VIEW COMPARISON:  04/29/2021 FINDINGS: The lungs are symmetrically expanded. No pneumothorax or pleural effusion. Coronary artery bypass  grafting has been performed. Cardiac size within normal limits. Mild bilateral perihilar and lower lung zone interstitial pulmonary infiltrate is present, progressive since prior examination, most in keeping with mild cardiogenic failure. No acute bone abnormality. IMPRESSION: Interval development of trace interstitial pulmonary edema, most in keeping with developing mild cardiogenic failure. Electronically Signed   By: Fidela Salisbury M.D.   On: 05/28/2021 02:09   IR Radiologist Eval & Mgmt  Result Date: 05/25/2021 Please refer to notes tab for details about interventional procedure. (Op Note)    TODAY-DAY OF DISCHARGE:  Subjective:   [place] today has no headache,no chest abdominal pain,no new weakness tingling or numbness, feels much better wants to go home today.   Objective:   Blood pressure (!) 121/54, pulse 70, temperature 97.8 F (36.6 C), temperature source Oral, resp. rate 17,  height 5' 7.99" (1.727 m), weight 79.3 kg, SpO2 99 %.  Intake/Output Summary (Last 24 hours) at 06/18/2021 1553 Last data filed at 06/18/2021 1500 Gross per 24 hour  Intake 1317.95 ml  Output 600 ml  Net 717.95 ml   Filed Weights   06/15/21 1844 06/17/21 0601  Weight: 90.1 kg 79.3 kg    Exam: Awake Alert, Oriented *3, No new F.N deficits, Normal affect .AT,PERRAL Supple Neck,No JVD, No cervical lymphadenopathy appriciated.  Symmetrical Chest wall movement, Good air movement bilaterally, CTAB RRR,No Gallops,Rubs or new Murmurs, No Parasternal Heave +ve B.Sounds, Abd Soft, Non tender, No organomegaly appriciated, No rebound -guarding or rigidity. No Cyanosis, Clubbing or edema, No new Rash or bruise   PERTINENT RADIOLOGIC STUDIES: No results found.   PERTINENT LAB RESULTS: CBC: Recent Labs    06/16/21 0259 06/17/21 0302  WBC 5.5 2.6*  HGB 10.2* 9.5*  HCT 34.8* 32.2*  PLT 145* 116*   CMET CMP     Component Value Date/Time   NA 134 (L) 06/18/2021 0250   NA 139 08/25/2020  1420   K 3.7 06/18/2021 0250   CL 106 06/18/2021 0250   CO2 22 06/18/2021 0250   GLUCOSE 103 (H) 06/18/2021 0250   BUN 20 06/18/2021 0250   BUN 26 08/25/2020 1420   CREATININE 1.13 06/18/2021 0250   CREATININE 1.13 05/20/2021 1444   CREATININE 0.75 06/09/2015 1609   CALCIUM 8.6 (L) 06/18/2021 0250   PROT 5.2 (L) 06/18/2021 0250   ALBUMIN 2.2 (L) 06/18/2021 0250   AST 58 (H) 06/18/2021 0250   AST 43 (H) 05/20/2021 1444   ALT 39 06/18/2021 0250   ALT 29 05/20/2021 1444   ALKPHOS 336 (H) 06/18/2021 0250   BILITOT 1.1 06/18/2021 0250   BILITOT 0.9 05/20/2021 1444   GFRNONAA >60 06/18/2021 0250   GFRNONAA >60 05/20/2021 1444   GFRAA 59 (L) 08/25/2020 1420    GFR Estimated Creatinine Clearance: 54.6 mL/min (by C-G formula based on SCr of 1.13 mg/dL). No results for input(s): LIPASE, AMYLASE in the last 72 hours. No results for input(s): CKTOTAL, CKMB, CKMBINDEX, TROPONINI in the last 72 hours. Invalid input(s): POCBNP No results for input(s): DDIMER in the last 72 hours. No results for input(s): HGBA1C in the last 72 hours. No results for input(s): CHOL, HDL, LDLCALC, TRIG, CHOLHDL, LDLDIRECT in the last 72 hours. No results for input(s): TSH, T4TOTAL, T3FREE, THYROIDAB in the last 72 hours.  Invalid input(s): FREET3 No results for input(s): VITAMINB12, FOLATE, FERRITIN, TIBC, IRON, RETICCTPCT in the last 72 hours. Coags: No results for input(s): INR in the last 72 hours.  Invalid input(s): PT  Microbiology: No results found for this or any previous visit (from the past 240 hour(s)).  FURTHER DISCHARGE INSTRUCTIONS:  Get Medicines reviewed and adjusted: Please take all your medications with you for your next visit with your Primary MD  Laboratory/radiological data: Please request your Primary MD to go over all hospital tests and procedure/radiological results at the follow up, please ask your Primary MD to get all Hospital records sent to his/her office.  In some cases,  they will be blood work, cultures and biopsy results pending at the time of your discharge. Please request that your primary care M.D. goes through all the records of your hospital data and follows up on these results.  Also Note the following: If you experience worsening of your admission symptoms, develop shortness of breath, life threatening emergency, suicidal or homicidal thoughts you must seek medical  attention immediately by calling 911 or calling your MD immediately  if symptoms less severe.  You must read complete instructions/literature along with all the possible adverse reactions/side effects for all the Medicines you take and that have been prescribed to you. Take any new Medicines after you have completely understood and accpet all the possible adverse reactions/side effects.   Do not drive when taking Pain medications or sleeping medications (Benzodaizepines)  Do not take more than prescribed Pain, Sleep and Anxiety Medications. It is not advisable to combine anxiety,sleep and pain medications without talking with your primary care practitioner  Special Instructions: If you have smoked or chewed Tobacco  in the last 2 yrs please stop smoking, stop any regular Alcohol  and or any Recreational drug use.  Wear Seat belts while driving.  Please note: You were cared for by a hospitalist during your hospital stay. Once you are discharged, your primary care physician will handle any further medical issues. Please note that NO REFILLS for any discharge medications will be authorized once you are discharged, as it is imperative that you return to your primary care physician (or establish a relationship with a primary care physician if you do not have one) for your post hospital discharge needs so that they can reassess your need for medications and monitor your lab values.  Total Time spent coordinating discharge including counseling, education and face to face time equals 35  minutes.  SignedOren Binet 06/18/2021 3:53 PM

## 2021-06-18 NOTE — TOC Benefit Eligibility Note (Signed)
Patient Advocate Encounter   Received notification that prior authorization for Xifaxan 550 mg tablets is required.   PA submitted on 06/18/2021 Key [id number] Status is pending       Lyndel Safe, [place] Patient Advocate Specialist [facility] Antimicrobial Stewardship Team Direct Number: [phone]  Fax: [phone]

## 2021-06-18 NOTE — Progress Notes (Signed)
I called [name] after I clarified with MD, and [name] is aware that [name] should continue taking the protonix twice a day as he previously was before admit although the AVS he left with stated once a day.

## 2021-06-18 NOTE — Consult Note (Signed)
   [facility] Inpatient Consult   06/18/2021  [name] Sep 05, 1945 [id number]  [facility] [[facility]] Patient: [name] PPO  Primary Care Provider:  [name], MD [place]   Status: OBS  Patient was screened for hospitalization. Patient will have the transition of care call conducted by the primary care provider. This patient is also in an Embedded practice which has a chronic disease management Embedded Care Management team. There has been attempts to reach the patient noted from the Embedded team without success of maintaining contact. Came by to speak with patient and patient is currently in patient care with staff.  Noted for possible transitioning. Reviewed inpatient [facility] RN notes as well as PT recommendations for post hospital transition.  Plan: Embedded Care Management and made aware of above needs.   Please contact for further questions,  [name], RN BSN [facility] Liaison  [phone] business mobile phone Toll free office [phone]  Fax number: [phone] [email or web address]

## 2021-06-20 DIAGNOSIS — I272 Pulmonary hypertension, unspecified: Secondary | ICD-10-CM | POA: Diagnosis not present

## 2021-06-20 DIAGNOSIS — J4520 Mild intermittent asthma, uncomplicated: Secondary | ICD-10-CM | POA: Diagnosis not present

## 2021-06-20 DIAGNOSIS — J449 Chronic obstructive pulmonary disease, unspecified: Secondary | ICD-10-CM | POA: Diagnosis not present

## 2021-06-20 DIAGNOSIS — K922 Gastrointestinal hemorrhage, unspecified: Secondary | ICD-10-CM | POA: Diagnosis not present

## 2021-06-20 DIAGNOSIS — K7031 Alcoholic cirrhosis of liver with ascites: Secondary | ICD-10-CM | POA: Diagnosis not present

## 2021-06-20 DIAGNOSIS — C229 Malignant neoplasm of liver, not specified as primary or secondary: Secondary | ICD-10-CM | POA: Diagnosis not present

## 2021-06-20 DIAGNOSIS — I2510 Atherosclerotic heart disease of native coronary artery without angina pectoris: Secondary | ICD-10-CM | POA: Diagnosis not present

## 2021-06-20 DIAGNOSIS — A000 Cholera due to Vibrio cholerae 01, biovar cholerae: Secondary | ICD-10-CM | POA: Diagnosis not present

## 2021-06-20 DIAGNOSIS — Z9181 History of falling: Secondary | ICD-10-CM | POA: Diagnosis not present

## 2021-06-20 DIAGNOSIS — D500 Iron deficiency anemia secondary to blood loss (chronic): Secondary | ICD-10-CM | POA: Diagnosis not present

## 2021-06-20 DIAGNOSIS — M109 Gout, unspecified: Secondary | ICD-10-CM | POA: Diagnosis not present

## 2021-06-20 DIAGNOSIS — I5033 Acute on chronic diastolic (congestive) heart failure: Secondary | ICD-10-CM | POA: Diagnosis not present

## 2021-06-20 DIAGNOSIS — N179 Acute kidney failure, unspecified: Secondary | ICD-10-CM | POA: Diagnosis not present

## 2021-06-20 DIAGNOSIS — R69 Illness, unspecified: Secondary | ICD-10-CM | POA: Diagnosis not present

## 2021-06-20 DIAGNOSIS — I110 Hypertensive heart disease with heart failure: Secondary | ICD-10-CM | POA: Diagnosis not present

## 2021-06-20 DIAGNOSIS — E119 Type 2 diabetes mellitus without complications: Secondary | ICD-10-CM | POA: Diagnosis not present

## 2021-06-20 DIAGNOSIS — Z6821 Body mass index (BMI) 21.0-21.9, adult: Secondary | ICD-10-CM | POA: Diagnosis not present

## 2021-06-20 DIAGNOSIS — E669 Obesity, unspecified: Secondary | ICD-10-CM | POA: Diagnosis not present

## 2021-06-21 ENCOUNTER — Telehealth: Payer: Self-pay | Admitting: Internal Medicine

## 2021-06-21 NOTE — Telephone Encounter (Signed)
[name], an OT with [facility] has called to update date for evaluation to next week on 10.31.22.     Callback #- [phone]

## 2021-06-21 NOTE — Telephone Encounter (Signed)
Noted  

## 2021-06-21 NOTE — Telephone Encounter (Signed)
Fyi.

## 2021-06-21 NOTE — Telephone Encounter (Signed)
Appt moved up to 06/30/21 at 11:40 am .Adonis Housekeeper

## 2021-06-22 ENCOUNTER — Telehealth: Payer: Self-pay | Admitting: Internal Medicine

## 2021-06-22 DIAGNOSIS — R69 Illness, unspecified: Secondary | ICD-10-CM | POA: Diagnosis not present

## 2021-06-22 DIAGNOSIS — J4520 Mild intermittent asthma, uncomplicated: Secondary | ICD-10-CM | POA: Diagnosis not present

## 2021-06-22 DIAGNOSIS — D696 Thrombocytopenia, unspecified: Secondary | ICD-10-CM | POA: Diagnosis not present

## 2021-06-22 DIAGNOSIS — G8929 Other chronic pain: Secondary | ICD-10-CM | POA: Diagnosis not present

## 2021-06-22 DIAGNOSIS — K7682 Hepatic encephalopathy: Secondary | ICD-10-CM | POA: Diagnosis not present

## 2021-06-22 DIAGNOSIS — G4733 Obstructive sleep apnea (adult) (pediatric): Secondary | ICD-10-CM | POA: Diagnosis not present

## 2021-06-22 DIAGNOSIS — H538 Other visual disturbances: Secondary | ICD-10-CM | POA: Diagnosis not present

## 2021-06-22 DIAGNOSIS — K279 Peptic ulcer, site unspecified, unspecified as acute or chronic, without hemorrhage or perforation: Secondary | ICD-10-CM | POA: Diagnosis not present

## 2021-06-22 DIAGNOSIS — K219 Gastro-esophageal reflux disease without esophagitis: Secondary | ICD-10-CM | POA: Diagnosis not present

## 2021-06-22 DIAGNOSIS — I272 Pulmonary hypertension, unspecified: Secondary | ICD-10-CM | POA: Diagnosis not present

## 2021-06-22 DIAGNOSIS — R32 Unspecified urinary incontinence: Secondary | ICD-10-CM | POA: Diagnosis not present

## 2021-06-22 DIAGNOSIS — E785 Hyperlipidemia, unspecified: Secondary | ICD-10-CM | POA: Diagnosis not present

## 2021-06-22 DIAGNOSIS — M109 Gout, unspecified: Secondary | ICD-10-CM | POA: Diagnosis not present

## 2021-06-22 DIAGNOSIS — E119 Type 2 diabetes mellitus without complications: Secondary | ICD-10-CM | POA: Diagnosis not present

## 2021-06-22 DIAGNOSIS — J449 Chronic obstructive pulmonary disease, unspecified: Secondary | ICD-10-CM | POA: Diagnosis not present

## 2021-06-22 DIAGNOSIS — I110 Hypertensive heart disease with heart failure: Secondary | ICD-10-CM | POA: Diagnosis not present

## 2021-06-22 DIAGNOSIS — M47819 Spondylosis without myelopathy or radiculopathy, site unspecified: Secondary | ICD-10-CM | POA: Diagnosis not present

## 2021-06-22 DIAGNOSIS — K7031 Alcoholic cirrhosis of liver with ascites: Secondary | ICD-10-CM | POA: Diagnosis not present

## 2021-06-22 DIAGNOSIS — E669 Obesity, unspecified: Secondary | ICD-10-CM | POA: Diagnosis not present

## 2021-06-22 DIAGNOSIS — M1990 Unspecified osteoarthritis, unspecified site: Secondary | ICD-10-CM | POA: Diagnosis not present

## 2021-06-22 DIAGNOSIS — D61818 Other pancytopenia: Secondary | ICD-10-CM | POA: Diagnosis not present

## 2021-06-22 DIAGNOSIS — K922 Gastrointestinal hemorrhage, unspecified: Secondary | ICD-10-CM | POA: Diagnosis not present

## 2021-06-22 DIAGNOSIS — I2510 Atherosclerotic heart disease of native coronary artery without angina pectoris: Secondary | ICD-10-CM | POA: Diagnosis not present

## 2021-06-22 DIAGNOSIS — I5032 Chronic diastolic (congestive) heart failure: Secondary | ICD-10-CM | POA: Diagnosis not present

## 2021-06-22 DIAGNOSIS — D509 Iron deficiency anemia, unspecified: Secondary | ICD-10-CM | POA: Diagnosis not present

## 2021-06-22 NOTE — Telephone Encounter (Signed)
See below

## 2021-06-22 NOTE — Telephone Encounter (Signed)
Gordon from [facility] has called and is requesting a verbal order to resume PT with pt. States that pt. Went toe hospital and PT wants to continue care, being that he's home. 1xweek/ 7 weeks.   Callback #- [phone], LVM if unavailabe

## 2021-06-23 DIAGNOSIS — I110 Hypertensive heart disease with heart failure: Secondary | ICD-10-CM | POA: Diagnosis not present

## 2021-06-23 DIAGNOSIS — N179 Acute kidney failure, unspecified: Secondary | ICD-10-CM | POA: Diagnosis not present

## 2021-06-23 DIAGNOSIS — M109 Gout, unspecified: Secondary | ICD-10-CM | POA: Diagnosis not present

## 2021-06-23 DIAGNOSIS — K922 Gastrointestinal hemorrhage, unspecified: Secondary | ICD-10-CM | POA: Diagnosis not present

## 2021-06-23 DIAGNOSIS — I272 Pulmonary hypertension, unspecified: Secondary | ICD-10-CM | POA: Diagnosis not present

## 2021-06-23 DIAGNOSIS — I2510 Atherosclerotic heart disease of native coronary artery without angina pectoris: Secondary | ICD-10-CM | POA: Diagnosis not present

## 2021-06-23 DIAGNOSIS — K7031 Alcoholic cirrhosis of liver with ascites: Secondary | ICD-10-CM | POA: Diagnosis not present

## 2021-06-23 DIAGNOSIS — I5033 Acute on chronic diastolic (congestive) heart failure: Secondary | ICD-10-CM | POA: Diagnosis not present

## 2021-06-23 DIAGNOSIS — R69 Illness, unspecified: Secondary | ICD-10-CM | POA: Diagnosis not present

## 2021-06-23 DIAGNOSIS — J449 Chronic obstructive pulmonary disease, unspecified: Secondary | ICD-10-CM | POA: Diagnosis not present

## 2021-06-23 DIAGNOSIS — C229 Malignant neoplasm of liver, not specified as primary or secondary: Secondary | ICD-10-CM | POA: Diagnosis not present

## 2021-06-23 DIAGNOSIS — Z9181 History of falling: Secondary | ICD-10-CM | POA: Diagnosis not present

## 2021-06-23 DIAGNOSIS — D500 Iron deficiency anemia secondary to blood loss (chronic): Secondary | ICD-10-CM | POA: Diagnosis not present

## 2021-06-23 DIAGNOSIS — Z6821 Body mass index (BMI) 21.0-21.9, adult: Secondary | ICD-10-CM | POA: Diagnosis not present

## 2021-06-23 DIAGNOSIS — J4520 Mild intermittent asthma, uncomplicated: Secondary | ICD-10-CM | POA: Diagnosis not present

## 2021-06-23 DIAGNOSIS — E119 Type 2 diabetes mellitus without complications: Secondary | ICD-10-CM | POA: Diagnosis not present

## 2021-06-23 DIAGNOSIS — E669 Obesity, unspecified: Secondary | ICD-10-CM | POA: Diagnosis not present

## 2021-06-23 NOTE — Telephone Encounter (Signed)
Fine to resume

## 2021-06-24 DIAGNOSIS — K922 Gastrointestinal hemorrhage, unspecified: Secondary | ICD-10-CM | POA: Diagnosis not present

## 2021-06-24 DIAGNOSIS — R69 Illness, unspecified: Secondary | ICD-10-CM | POA: Diagnosis not present

## 2021-06-24 DIAGNOSIS — Z6821 Body mass index (BMI) 21.0-21.9, adult: Secondary | ICD-10-CM | POA: Diagnosis not present

## 2021-06-24 DIAGNOSIS — J4520 Mild intermittent asthma, uncomplicated: Secondary | ICD-10-CM | POA: Diagnosis not present

## 2021-06-24 DIAGNOSIS — J449 Chronic obstructive pulmonary disease, unspecified: Secondary | ICD-10-CM | POA: Diagnosis not present

## 2021-06-24 DIAGNOSIS — D500 Iron deficiency anemia secondary to blood loss (chronic): Secondary | ICD-10-CM | POA: Diagnosis not present

## 2021-06-24 DIAGNOSIS — E119 Type 2 diabetes mellitus without complications: Secondary | ICD-10-CM | POA: Diagnosis not present

## 2021-06-24 DIAGNOSIS — E669 Obesity, unspecified: Secondary | ICD-10-CM | POA: Diagnosis not present

## 2021-06-24 DIAGNOSIS — I272 Pulmonary hypertension, unspecified: Secondary | ICD-10-CM | POA: Diagnosis not present

## 2021-06-24 DIAGNOSIS — I5033 Acute on chronic diastolic (congestive) heart failure: Secondary | ICD-10-CM | POA: Diagnosis not present

## 2021-06-24 DIAGNOSIS — I110 Hypertensive heart disease with heart failure: Secondary | ICD-10-CM | POA: Diagnosis not present

## 2021-06-24 DIAGNOSIS — C229 Malignant neoplasm of liver, not specified as primary or secondary: Secondary | ICD-10-CM | POA: Diagnosis not present

## 2021-06-24 DIAGNOSIS — I2510 Atherosclerotic heart disease of native coronary artery without angina pectoris: Secondary | ICD-10-CM | POA: Diagnosis not present

## 2021-06-24 DIAGNOSIS — Z9181 History of falling: Secondary | ICD-10-CM | POA: Diagnosis not present

## 2021-06-24 DIAGNOSIS — K7031 Alcoholic cirrhosis of liver with ascites: Secondary | ICD-10-CM | POA: Diagnosis not present

## 2021-06-24 DIAGNOSIS — M109 Gout, unspecified: Secondary | ICD-10-CM | POA: Diagnosis not present

## 2021-06-24 DIAGNOSIS — N179 Acute kidney failure, unspecified: Secondary | ICD-10-CM | POA: Diagnosis not present

## 2021-06-25 NOTE — Telephone Encounter (Signed)
Called [name]. LVM with verbals.

## 2021-06-28 NOTE — Telephone Encounter (Signed)
See below

## 2021-06-28 NOTE — Telephone Encounter (Signed)
[name] from [facility] called stating that a clinician went out to do an assessment for the patient and discovered the patient had an infestation or cockroaches. Lelon Frohlich states that until at least one treatment is made for the infestations  they will not be providing services.

## 2021-06-29 NOTE — Telephone Encounter (Signed)
Liji  from [facility] called stating a social worker is needed to assist with infestation so that therapy can begin she asked if someone can return her call. [phone]

## 2021-06-30 ENCOUNTER — Ambulatory Visit (INDEPENDENT_AMBULATORY_CARE_PROVIDER_SITE_OTHER): Payer: Medicare HMO | Admitting: Cardiovascular Disease

## 2021-06-30 ENCOUNTER — Encounter: Payer: Self-pay | Admitting: Cardiovascular Disease

## 2021-06-30 ENCOUNTER — Other Ambulatory Visit: Payer: Self-pay

## 2021-06-30 VITALS — BP 130/50 | HR 85 | Ht 67.0 in | Wt 195.6 lb

## 2021-06-30 DIAGNOSIS — I5030 Unspecified diastolic (congestive) heart failure: Secondary | ICD-10-CM

## 2021-06-30 NOTE — Patient Instructions (Signed)
Medication Instructions:  Your physician recommends that you continue on your current medications as directed. Please refer to the Current Medication list given to you today.  *If you need a refill on your cardiac medications before your next appointment, please call your pharmacy*   Lab Work: None If you have labs (blood work) drawn today and your tests are completely normal, you will receive your results only by: [place] (if you have MyChart) OR A paper copy in the mail If you have any lab test that is abnormal or we need to change your treatment, we will call you to review the results.   Testing/Procedures: None   Follow-Up: At [facility], you and your health needs are our priority.  As part of our continuing mission to provide you with exceptional heart care, we have created designated Provider Care Teams.  These Care Teams include your primary Cardiologist (physician) and Advanced Practice Providers (APPs -  Physician Assistants and Nurse Practitioners) who all work together to provide you with the care you need, when you need it.  We recommend signing up for the patient portal called "MyChart".  Sign up information is provided on this After Visit Summary.  MyChart is used to connect with patients for Virtual Visits (Telemedicine).  Patients are able to view lab/test results, encounter notes, upcoming appointments, etc.  Non-urgent messages can be sent to your provider as well.   To learn more about what you can do with MyChart, go to [email or web address].    Your next appointment:   1 year(s)  The format for your next appointment:   In Person  Provider:   You will see one of the following Advanced Practice Providers on your designated Care Team:   Richardson Dopp, PA-C Robbie Lis, [place]   Other Instructions

## 2021-06-30 NOTE — Progress Notes (Signed)
Cardiology Office Note   Date:  06/30/2021   ID:  [name], DOB 09/24/1945, MRN [id number]  PCP:  [name], MD  Cardiologist:   Mertie Moores, MD   Chief Complaint  Patient presents with   Congestive Heart Failure        Hyperlipidemia        Notes prior to 2014:   [name] is a 75 y.o. male who presents for follow up of his CAD    Expand All Collapse All      [name] Date of Birth              [birth year]        [facility] [address], [address], [place], [place]  [address]                                  [place], [place]  [address] [phone]                                                  [phone]   Fax  [phone]                                          Fax [phone]  Problem List: 1. CAD -  3.0 x 28 mm drug-eluting stent (06-28-12), s/p CABG  2. Indigestion 3. Anemia     [name] is a 75 yo with hx of indigestion / chest pain  that develops with walking.  The pain is a "ache" and lasts as long as he is walking.   It causes him some dyspnea.  It typically goes away when he stops.  He has lots of belching.  Hx of asthma. Hx of smoking in the past.  No regular exercise.  He is now retired.  He gets a little exercise every day.  He had a dobutamine echo that revealed mild ischemia in the inferior wall.    He had a cardiac cath which revealed a tight irregular LAD stenosis. He had PTCA and stenting of his LAD by Dr. Burt Knack.  We have advised him to stop  eating fried foods. He still is eating fried potatoes on a regular basis.   Jan 14, 2013:  [place] has done well from a cardiac standpoint. He is having some problems with anemia. He was scheduled have a colonoscopy tomorrow but I did not want to stop the Effient  and aspirin. The colonoscopy was canceled.  Nov. 17, 2014:  [name] is doing well.  He is now a year  out from his stenting and can stop the Effient for his colonoscopy.  He does some exercise.   He has completely ignored my dietary suggestions from previous visits.  Still eats everything that he wants to.  Having some  abdominal pains.    Oct. 28, 2015:  [name] is doing OK. He's had a  hospital admission on [name] 31 and was found have severe left main disease as well as severe coronary disease. He had coronary artery bypass grafting on August 3.    (CABG)X3 LIMA-LAD; SVG-OM; SVG-PD  Still has some chest soreness     Jan. 28, 2016:  [name] is doing well .  No CP . A bit or soreness from shoveling snow.  No angina while shoveling , just chest wall soreness.  The pain is on the left upper chest. Tender to palpitation. Not related to walking.  Has been present for 4-5 days ( since it snowed) not associated with dyspnea , sweats, or dizziness  No dyspnea.   Trying to stick to his diet. Not as successful during the holidays.   [name] 1, 2016: No angina. Has some chest wall tenderness - worse with coughing . Still leg edema   Oct. 11, 2016:  Had CABG August , 2015 Still having some MSK pain in his chest .  No angina   Staying pretty active.  Is retired.  Does lots of activities around the house and yard.   December 08, 2015:  [name] was seen today for follow up .  Hx of CABG BP and HR are well controlled.  Able to do all of his normal activities  October 09, 2017: [name] is seen back today for follow-up of his coronary artery disease, hypertension, hyperlipidemia.  He was recently admitted to the hospital for a GI bleed on September 29, 2017. Hb was 5.6 on admission   Has been avoiding salty foods.   [name] 14, 2021: [name] is seen today for follow up of his CAD, HLD,HTN  No questions Feet are swollen - has been eating more salt than usual  Sept. 13, 2021: [name] is seen for follow up of his CAD, HLD, HTN He was admitted with worsening dyspnea and lew swelling on Aug. 18, 2021: He has  hem _ stools EGD - showed gastric varices They did not get an echo in the hospital  Breathing is good.  Is active,   Does not walk much   Nov. 2, 2022: [name] was admitted to the hospital with acute encephalopathy.  He was treated with lactulose for high ammonia levels.  He was discharged with instructions to follow-up with his primary care doctor in 1 to 2 weeks.  He is not made that follow-up appointment yet.  Ammonia levels were 105 on the day prior to discharge. He has had elevated ammonia levels in the past  Is taking his lactulose   He thinks he is getting better from a confusion standpoint   Past Medical History:  Diagnosis Date   Arthritis    "joints tighten up sometimes" (03/27/2104)   Carcinoma of liver, hepatocellular ([place]) 06/30/2020   Chronic lower back pain    Coronary artery disease involving native coronary artery of native heart without angina pectoris    Severe left main disease at catheterization [name] 2015  CABG x3 with a LIMA to the LAD, SVG to the OM, SVG to the PDA on 03/31/14. EF 60% by cath.    Diastolic heart failure (HCC)    GERD without esophagitis 08/04/2010   Hepatic cirrhosis ([place])    a. Dx 01/2014 - CT a/p    History of blood transfusion    "related to bleeding ulcers"   History of concussion    1976--  NO RESIDUAL  History of GI bleed    a. UGIB 07/2012;  b. 01/2014 admission with GIB/FOB stool req 1U prbc's->EGD showed portal gastropathy, barrett's esoph, and chronic active h. pylori gastritis.   History of gout    2007 &  2008  LEFT LEG-- NO ISSUE SINCE   Hyperlipidemia    Iron deficiency anemia    Kidney stones    OA (osteoarthritis of spine)    LOWER BACK--  INTERMITTANT LEFT LEG NUMBNESS   OSA (obstructive sleep apnea)    PULMOLOGIST-  DR CLANCE--  MODERATE OSA  STARTED CPAP 2012--  BUT CURRENTLY HAS NOT USED PAST 6 MONTHS   Phimosis    a. s/p circumcision 2015.   Type 2 diabetes mellitus ([place])    Unspecified essential hypertension      Past Surgical History:  Procedure Laterality Date   ANKLE FRACTURE SURGERY Right 1989   "plate put in"   APPENDECTOMY  05-16-2004   open   BIOPSY  01/08/2021   Procedure: BIOPSY;  Surgeon: Wilford Corner, MD;  Location: [facility];  Service: Endoscopy;;   BIOPSY  05/02/2021   Procedure: BIOPSY;  Surgeon: Arta Silence, MD;  Location: [facility];  Service: Endoscopy;;   CIRCUMCISION N/A 09/09/2013   Procedure: CIRCUMCISION ADULT;  Surgeon: Bernestine Amass, MD;  Location: [facility];  Service: Urology;  Laterality: N/A;   COLECTOMY  05-16-2004   COLONOSCOPY N/A 05/02/2021   Procedure: COLONOSCOPY;  Surgeon: Arta Silence, MD;  [facility]: [facility];  Service: Endoscopy;  Laterality: N/A;   COLONOSCOPY WITH PROPOFOL N/A 11/20/2020   Procedure: COLONOSCOPY WITH PROPOFOL;  Surgeon: Ronald Lobo, MD;  Location: [facility];  Service: Endoscopy;  Laterality: N/A;   CORONARY ANGIOPLASTY WITH STENT PLACEMENT  06/28/2012  DR COOPER   PCI W/  X1 DES to [place]. LAD/  LM  40% OSTIAL & 50-60% DISTAL /  50% PROX LCX/  30-40% PROX RCA & 50% MID RCA/   LVEF 65-70%   CORONARY ARTERY BYPASS GRAFT N/A 03/31/2014   Procedure: CORONARY ARTERY BYPASS GRAFTING (CABG) times 3 using left internal mammary artery and right saphenous vein.;  Surgeon: Melrose Nakayama, MD;  Location: [facility];  Service: Open Heart Surgery;  Laterality: N/A;   DOBUTAMINE STRESS ECHO  06-08-2012   MODERATE HYPOKINESIS/ ISCHEMIA MID INFERIOR WALL   ESOPHAGOGASTRODUODENOSCOPY  08/15/2012   Procedure: ESOPHAGOGASTRODUODENOSCOPY (EGD);  Surgeon: Wonda Horner, MD;  Location: [facility] ENDOSCOPY;  Service: Endoscopy;  Laterality: N/A;   ESOPHAGOGASTRODUODENOSCOPY N/A 02/17/2014   Procedure: ESOPHAGOGASTRODUODENOSCOPY (EGD);  Surgeon: Jeryl Columbia, MD;  Location: [facility] END[facility]COPY;  Service: Endoscopy;  Laterality: N/A;   ESOPHAGOGASTRODUODENOSCOPY N/A 04/17/2020   Procedure: ESOPHAGOGASTRODUODENOSCOPY (EGD);  Surgeon: Ronnette Juniper, MD;  Location: [facility];  Service: Gastroenterology;  Laterality: N/A;   ESOPHAGOGASTRODUODENOSCOPY N/A 09/11/2020   Procedure: ESOPHAGOGASTRODUODENOSCOPY (EGD);  Surgeon: Clarene Essex, MD;  Location: [facility];  Service: Endoscopy;  Laterality: N/A;   ESOPHAGOGASTRODUODENOSCOPY  10/09/2020   ESOPHAGOGASTRODUODENOSCOPY N/A 10/09/2020   Procedure: ESOPHAGOGASTRODUODENOSCOPY (EGD);  Surgeon: Otis Brace, MD;  Location: [facility] ENDOSCOPY;  Service: Gastroenterology;  Laterality: N/A;   ESOPHAGOGASTRODUODENOSCOPY N/A 01/08/2021   Procedure: ESOPHAGOGASTRODUODENOSCOPY (EGD);  Surgeon: Wilford Corner, MD;  Location: [facility];  Service: Endoscopy;  Laterality: N/A;   ESOPHAGOGASTRODUODENOSCOPY N/A 05/02/2021   Procedure: ESOPHAGOGASTRODUODENOSCOPY (EGD);  Surgeon: Arta Silence, MD;  Location: [facility];  Service: Endoscopy;  Laterality: N/A;   ESOPHAGOGASTRODUODENOSCOPY (EGD) WITH PROPOFOL N/A 09/30/2017   Procedure: ESOPHAGOGASTRODUODENOSCOPY (EGD) WITH PROPOFOL;  Surgeon: Anson Fret  F, MD;  Location: [facility];  Service: Endoscopy;  Laterality: N/A;   ESOPHAGOGASTRODUODENOSCOPY (EGD) WITH PROPOFOL N/A 12/20/2017   Procedure: ESOPHAGOGASTRODUODENOSCOPY (EGD) WITH PROPOFOL;  Surgeon: Clarene Essex, MD;  Location: [facility];  Service: Endoscopy;  Laterality: N/A;   ESOPHAGOGASTRODUODENOSCOPY (EGD) WITH PROPOFOL N/A 08/10/2020   Procedure: ESOPHAGOGASTRODUODENOSCOPY (EGD) WITH PROPOFOL;  Surgeon: Wilford Corner, MD;  Location: [facility];  Service: Endoscopy;  Laterality: N/A;   ESOPHAGOGASTRODUODENOSCOPY (EGD) WITH PROPOFOL N/A 11/20/2020   Procedure: ESOPHAGOGASTRODUODENOSCOPY (EGD) WITH PROPOFOL;  Surgeon: Ronald Lobo, MD;  [facility];  Service: Endoscopy;  Laterality: N/A;   ESOPHAGOGASTRODUODENOSCOPY (EGD) WITH PROPOFOL N/A 02/28/2021   Procedure: ESOPHAGOGASTRODUODENOSCOPY (EGD) WITH PROPOFOL;  Surgeon: Clarene Essex, MD;  [facility];  Service:  Endoscopy;  Laterality: N/A;   GIVENS CAPSULE STUDY N/A 11/20/2020   Procedure: GIVENS CAPSULE STUDY;  Surgeon: Ronald Lobo, MD;  [facility]: [facility];  Service: Endoscopy;  Laterality: N/A;   HEMOSTASIS CLIP PLACEMENT  05/02/2021   Procedure: HEMOSTASIS CLIP PLACEMENT;  Surgeon: Arta Silence, MD;  [facility];  Service: Endoscopy;;   HERNIA REPAIR     INTRAOPERATIVE TRANSESOPHAGEAL ECHOCARDIOGRAM N/A 03/31/2014   Procedure: INTRAOPERATIVE TRANSESOPHAGEAL ECHOCARDIOGRAM;  Surgeon: Melrose Nakayama, MD;  Location: [facility];  Service: Open Heart Surgery;  Laterality: N/A;   IR ANGIOGRAM SELECTIVE EACH ADDITIONAL VESSEL  06/16/2020   IR ANGIOGRAM SELECTIVE EACH ADDITIONAL VESSEL  06/16/2020   IR ANGIOGRAM SELECTIVE EACH ADDITIONAL VESSEL  06/16/2020   IR ANGIOGRAM SELECTIVE EACH ADDITIONAL VESSEL  06/16/2020   IR ANGIOGRAM SELECTIVE EACH ADDITIONAL VESSEL  06/16/2020   IR ANGIOGRAM SELECTIVE EACH ADDITIONAL VESSEL  06/16/2020   IR ANGIOGRAM SELECTIVE EACH ADDITIONAL VESSEL  06/16/2020   IR ANGIOGRAM SELECTIVE EACH ADDITIONAL VESSEL  06/16/2020   IR ANGIOGRAM SELECTIVE EACH ADDITIONAL VESSEL  06/16/2020   IR ANGIOGRAM SELECTIVE EACH ADDITIONAL VESSEL  06/30/2020   IR ANGIOGRAM SELECTIVE EACH ADDITIONAL VESSEL  06/30/2020   IR ANGIOGRAM VISCERAL SELECTIVE  06/16/2020   IR ANGIOGRAM VISCERAL SELECTIVE  06/30/2020   IR EMBO ARTERIAL NOT HEMORR HEMANG INC GUIDE ROADMAPPING  06/16/2020   IR EMBO TUMOR ORGAN ISCHEMIA INFARCT INC GUIDE ROADMAPPING  06/30/2020   IR EMBO VENOUS NOT HEMORR HEMANG  INC GUIDE ROADMAPPING  01/13/2021   IR IVUS EACH ADDITIONAL NON CORONARY VESSEL  01/13/2021   IR RADIOLOGIST EVAL & MGMT  05/06/2020   IR RADIOLOGIST EVAL & MGMT  05/20/2020   IR RADIOLOGIST EVAL & MGMT  05/25/2021   IR TIPS  01/13/2021   IR US GUIDE VASC ACCESS RIGHT  06/16/2020   IR US GUIDE VASC ACCESS RIGHT  06/30/2020   IR US GUIDE VASC ACCESS RIGHT  01/13/2021   LAPAROSCOPIC UMBILICAL HERNIA  REPAIR W/ MESH  06-06-2011   LEFT HEART CATHETERIZATION WITH CORONARY ANGIOGRAM N/A 03/28/2014   Procedure: LEFT HEART CATHETERIZATION WITH CORONARY ANGIOGRAM;  Surgeon: Sinclair Grooms, MD;  Location: [facility] CATH LAB;  Service: Cardiovascular;  Laterality: N/A;   LIPOMA EXCISION Left 08/25/2017   Procedure: EXCISION LIPOMA LEFT POSTERIOR THIGH;  Surgeon: Clovis Riley, MD;  Location: [facility];  Service: General;  Laterality: Left;   NEPHROLITHOTOMY  1990'S   OPEN APPENDECTOMY W/ PARTIAL CECECTOMY  05-16-2004   PERCUTANEOUS CORONARY STENT INTERVENTION (PCI-S) N/A 06/28/2012   Procedure: PERCUTANEOUS CORONARY STENT INTERVENTION (PCI-S);  Surgeon: Sherren Mocha, MD;  Location: [facility] CATH LAB;  Service: Cardiovascular;  Laterality: N/A;   POLYPECTOMY  11/20/2020   Procedure: POLYPECTOMY;  Surgeon: Buccini,  Herbie Baltimore, MD;  Location: [facility];  Service: Endoscopy;;   POLYPECTOMY  05/02/2021   Procedure: POLYPECTOMY;  Surgeon: Arta Silence, MD;  Location: [facility];  Service: Endoscopy;;   RADIOLOGY WITH ANESTHESIA N/A 01/13/2021   Procedure: IR WITH ANESTHESIA - TIPS;  Surgeon: Suzette Battiest, MD;  Location: [facility];  Service: Radiology;  Laterality: N/A;     Current Outpatient Medications  Medication Sig Dispense Refill   Accu-Chek Softclix Lancets lancets USE AS DIRECTED TO TEST BLOOD SUGAR FOUR TIMES DAILY 300 each 3   ANORO ELLIPTA 62.5-25 MCG/ACT AEPB Inhale 1 puff into the lungs daily.     blood glucose meter kit and supplies Dispense based on patient and insurance preference. Use up to four times daily as directed. (FOR ICD-10 E10.9, E11.9). 1 each 0   ferrous sulfate 325 (65 FE) MG tablet Take 1 tablet (325 mg total) by mouth daily with breakfast. 90 tablet 0   furosemide (LASIX) 40 MG tablet Take 1 tablet (40 mg total) by mouth daily. 90 tablet 0   lactulose (CHRONULAC) 10 GM/15ML solution Take 45 mLs (30 g total) by mouth 3 (three) times daily. 1892 mL 1   pantoprazole (PROTONIX) 40 MG  tablet Take 1 tablet (40 mg total) by mouth 2 (two) times daily before a meal.     rifaximin (XIFAXAN) 550 MG TABS tablet Take 1 tablet (550 mg total) by mouth 2 (two) times daily. 60 tablet 1   albuterol (VENTOLIN HFA) 108 (90 Base) MCG/ACT inhaler Inhale 2 puffs into the lungs every 6 (six) hours as needed for wheezing or shortness of breath. (Patient not taking: Reported on 06/30/2021) 8 g 0   feeding supplement (ENSURE ENLIVE / ENSURE PLUS) LIQD Take 237 mLs by mouth 2 (two) times daily between meals. (Patient not taking: Reported on 06/30/2021) 14220 mL 0   spironolactone (ALDACTONE) 50 MG tablet Take 1 tablet (50 mg total) by mouth daily. (Patient not taking: Reported on 06/30/2021) 30 tablet 1   No current facility-administered medications for this visit.    Allergies:   Fluzone quadrivalent [influenza vac split quad]    Social History:  The patient  reports that he quit smoking about 24 years ago. His smoking use included cigarettes. He has a 80.00 pack-year smoking history. He has never used smokeless tobacco. He reports that he does not currently use alcohol after a past usage of about 8.0 standard drinks per week. He reports that he does not use drugs.   Family History:  The patient's family history includes Cancer in his brother, father, mother, sister, and sister; Colon cancer in an other family member; Coronary artery disease in an other family member; Diabetes in an other family member; Lung cancer in his sister.    ROS: As noted in current history, all other systems are negative.   Physical Exam: Blood pressure (!) 130/50, pulse 85, height '5\' 7"'  (1.702 m), weight 195 lb 9.6 oz (88.7 kg), SpO2 99 %.  GEN:  Well nourished, well developed in no acute distress HEENT: Normal NECK: No JVD; No carotid bruits LYMPHATICS: No lymphadenopathy CARDIAC: RRR , no murmurs, rubs, gallops RESPIRATORY:  Clear to auscultation without rales, wheezing or rhonchi  ABDOMEN: Soft, non-tender,  non-distended MUSCULOSKELETAL:   2+ pitting edema in lower legs and feet  SKIN: Warm and dry NEUROLOGIC:  Alert and oriented x 3   EKG:     Recent Labs: 01/24/2021: TSH 0.953 05/28/2021: B Natriuretic Peptide 283.5 06/17/2021: Hemoglobin 9.5; Platelets  116 06/18/2021: ALT 39; BUN 20; Creatinine, Ser 1.13; Magnesium 1.9; Potassium 3.7; Sodium 134    Lipid Panel    Component Value Date/Time   CHOL 150 11/01/2019 1030   TRIG 69 11/01/2019 1030   HDL 38 (L) 11/01/2019 1030   CHOLHDL 3.9 11/01/2019 1030   VLDL 14 11/01/2019 1030   LDLCALC 98 11/01/2019 1030      Wt Readings from Last 3 Encounters:  06/30/21 195 lb 9.6 oz (88.7 kg)  06/17/21 174 lb 13.2 oz (79.3 kg)  06/02/21 198 lb 10.2 oz (90.1 kg)     Other studies Reviewed: Additional studies/ records that were reviewed today include: . Review of the above records demonstrates:    ASSESSMENT AND PLAN:  1.  Hepatic encephalopathy: Namish was admitted to the hospital with acute encephalopathy.  He was treated with lactulose for high ammonia levels.  He was discharged with instructions to follow-up with his primary care doctor in 1 to 2 weeks.  He is not made that follow-up appointment yet.  Ammonia levels were 105 on the day prior to discharge. He needs to see primary care . I agree with the hospital team that he will likely beneft from palliative care    1.  CAD :   no angina   2. Essential hypertension:   .BP is well controlled.   3. Diabetes mellitus:   4. Obesity:   5. Hyperlipidemia:  -    6. Leg edema : He has significant leg edema.  He still admits eating lots of salty foods.   We have reminded him to elevated his legs regularly .      Current medicines are reviewed at length with the patient today.  The patient does not have concerns regarding medicines.  The following changes have been made:  no change  Labs/ tests ordered today include:   No orders of the defined types were placed in this  encounter.   Disposition:        will have him see an APP in 1 year.      Signed, Mertie Moores, MD  06/30/2021 12:23 PM    [facility] [place], [place], [place]  [address] Phone: [phone]; Fax: [phone]

## 2021-07-01 NOTE — Telephone Encounter (Signed)
See below

## 2021-07-01 NOTE — Telephone Encounter (Signed)
Liji from [facility] calling back  Says she was calling back to check status of requesting social worker due to patient having massive infestation  Says home health therapy is currently on hold until someone can get this under control  Please call [phone]

## 2021-07-02 DIAGNOSIS — G8929 Other chronic pain: Secondary | ICD-10-CM | POA: Diagnosis not present

## 2021-07-02 DIAGNOSIS — K219 Gastro-esophageal reflux disease without esophagitis: Secondary | ICD-10-CM | POA: Diagnosis not present

## 2021-07-02 DIAGNOSIS — M47819 Spondylosis without myelopathy or radiculopathy, site unspecified: Secondary | ICD-10-CM | POA: Diagnosis not present

## 2021-07-02 DIAGNOSIS — I2510 Atherosclerotic heart disease of native coronary artery without angina pectoris: Secondary | ICD-10-CM | POA: Diagnosis not present

## 2021-07-02 DIAGNOSIS — M109 Gout, unspecified: Secondary | ICD-10-CM | POA: Diagnosis not present

## 2021-07-02 DIAGNOSIS — M1990 Unspecified osteoarthritis, unspecified site: Secondary | ICD-10-CM | POA: Diagnosis not present

## 2021-07-02 DIAGNOSIS — K922 Gastrointestinal hemorrhage, unspecified: Secondary | ICD-10-CM | POA: Diagnosis not present

## 2021-07-02 DIAGNOSIS — H538 Other visual disturbances: Secondary | ICD-10-CM | POA: Diagnosis not present

## 2021-07-02 DIAGNOSIS — K7682 Hepatic encephalopathy: Secondary | ICD-10-CM | POA: Diagnosis not present

## 2021-07-02 DIAGNOSIS — I5032 Chronic diastolic (congestive) heart failure: Secondary | ICD-10-CM | POA: Diagnosis not present

## 2021-07-02 DIAGNOSIS — J4520 Mild intermittent asthma, uncomplicated: Secondary | ICD-10-CM | POA: Diagnosis not present

## 2021-07-02 DIAGNOSIS — R69 Illness, unspecified: Secondary | ICD-10-CM | POA: Diagnosis not present

## 2021-07-02 DIAGNOSIS — D61818 Other pancytopenia: Secondary | ICD-10-CM | POA: Diagnosis not present

## 2021-07-02 DIAGNOSIS — R32 Unspecified urinary incontinence: Secondary | ICD-10-CM | POA: Diagnosis not present

## 2021-07-02 DIAGNOSIS — I110 Hypertensive heart disease with heart failure: Secondary | ICD-10-CM | POA: Diagnosis not present

## 2021-07-02 DIAGNOSIS — K279 Peptic ulcer, site unspecified, unspecified as acute or chronic, without hemorrhage or perforation: Secondary | ICD-10-CM | POA: Diagnosis not present

## 2021-07-02 DIAGNOSIS — D696 Thrombocytopenia, unspecified: Secondary | ICD-10-CM | POA: Diagnosis not present

## 2021-07-02 DIAGNOSIS — G4733 Obstructive sleep apnea (adult) (pediatric): Secondary | ICD-10-CM | POA: Diagnosis not present

## 2021-07-02 DIAGNOSIS — J449 Chronic obstructive pulmonary disease, unspecified: Secondary | ICD-10-CM | POA: Diagnosis not present

## 2021-07-02 DIAGNOSIS — E785 Hyperlipidemia, unspecified: Secondary | ICD-10-CM | POA: Diagnosis not present

## 2021-07-02 DIAGNOSIS — I272 Pulmonary hypertension, unspecified: Secondary | ICD-10-CM | POA: Diagnosis not present

## 2021-07-02 DIAGNOSIS — E119 Type 2 diabetes mellitus without complications: Secondary | ICD-10-CM | POA: Diagnosis not present

## 2021-07-02 DIAGNOSIS — E669 Obesity, unspecified: Secondary | ICD-10-CM | POA: Diagnosis not present

## 2021-07-02 DIAGNOSIS — K7031 Alcoholic cirrhosis of liver with ascites: Secondary | ICD-10-CM | POA: Diagnosis not present

## 2021-07-02 DIAGNOSIS — D509 Iron deficiency anemia, unspecified: Secondary | ICD-10-CM | POA: Diagnosis not present

## 2021-07-02 NOTE — Telephone Encounter (Signed)
Okay for verbal for social worker

## 2021-07-05 ENCOUNTER — Other Ambulatory Visit: Payer: Self-pay | Admitting: Physician Assistant

## 2021-07-05 DIAGNOSIS — J4520 Mild intermittent asthma, uncomplicated: Secondary | ICD-10-CM | POA: Diagnosis not present

## 2021-07-05 DIAGNOSIS — E119 Type 2 diabetes mellitus without complications: Secondary | ICD-10-CM | POA: Diagnosis not present

## 2021-07-05 DIAGNOSIS — I2510 Atherosclerotic heart disease of native coronary artery without angina pectoris: Secondary | ICD-10-CM | POA: Diagnosis not present

## 2021-07-05 DIAGNOSIS — K7031 Alcoholic cirrhosis of liver with ascites: Secondary | ICD-10-CM | POA: Diagnosis not present

## 2021-07-05 DIAGNOSIS — M47819 Spondylosis without myelopathy or radiculopathy, site unspecified: Secondary | ICD-10-CM | POA: Diagnosis not present

## 2021-07-05 DIAGNOSIS — K279 Peptic ulcer, site unspecified, unspecified as acute or chronic, without hemorrhage or perforation: Secondary | ICD-10-CM | POA: Diagnosis not present

## 2021-07-05 DIAGNOSIS — D509 Iron deficiency anemia, unspecified: Secondary | ICD-10-CM | POA: Diagnosis not present

## 2021-07-05 DIAGNOSIS — E669 Obesity, unspecified: Secondary | ICD-10-CM | POA: Diagnosis not present

## 2021-07-05 DIAGNOSIS — I5032 Chronic diastolic (congestive) heart failure: Secondary | ICD-10-CM | POA: Diagnosis not present

## 2021-07-05 DIAGNOSIS — D61818 Other pancytopenia: Secondary | ICD-10-CM

## 2021-07-05 DIAGNOSIS — J449 Chronic obstructive pulmonary disease, unspecified: Secondary | ICD-10-CM | POA: Diagnosis not present

## 2021-07-05 DIAGNOSIS — D696 Thrombocytopenia, unspecified: Secondary | ICD-10-CM | POA: Diagnosis not present

## 2021-07-05 DIAGNOSIS — M109 Gout, unspecified: Secondary | ICD-10-CM | POA: Diagnosis not present

## 2021-07-05 DIAGNOSIS — E785 Hyperlipidemia, unspecified: Secondary | ICD-10-CM | POA: Diagnosis not present

## 2021-07-05 DIAGNOSIS — G4733 Obstructive sleep apnea (adult) (pediatric): Secondary | ICD-10-CM | POA: Diagnosis not present

## 2021-07-05 DIAGNOSIS — H538 Other visual disturbances: Secondary | ICD-10-CM | POA: Diagnosis not present

## 2021-07-05 DIAGNOSIS — K7682 Hepatic encephalopathy: Secondary | ICD-10-CM | POA: Diagnosis not present

## 2021-07-05 DIAGNOSIS — M1990 Unspecified osteoarthritis, unspecified site: Secondary | ICD-10-CM | POA: Diagnosis not present

## 2021-07-05 DIAGNOSIS — R32 Unspecified urinary incontinence: Secondary | ICD-10-CM | POA: Diagnosis not present

## 2021-07-05 DIAGNOSIS — I272 Pulmonary hypertension, unspecified: Secondary | ICD-10-CM | POA: Diagnosis not present

## 2021-07-05 DIAGNOSIS — R69 Illness, unspecified: Secondary | ICD-10-CM | POA: Diagnosis not present

## 2021-07-05 DIAGNOSIS — G8929 Other chronic pain: Secondary | ICD-10-CM | POA: Diagnosis not present

## 2021-07-05 DIAGNOSIS — K922 Gastrointestinal hemorrhage, unspecified: Secondary | ICD-10-CM | POA: Diagnosis not present

## 2021-07-05 DIAGNOSIS — K219 Gastro-esophageal reflux disease without esophagitis: Secondary | ICD-10-CM | POA: Diagnosis not present

## 2021-07-05 DIAGNOSIS — I110 Hypertensive heart disease with heart failure: Secondary | ICD-10-CM | POA: Diagnosis not present

## 2021-07-06 ENCOUNTER — Inpatient Hospital Stay: Payer: Medicare HMO

## 2021-07-06 ENCOUNTER — Inpatient Hospital Stay: Payer: Medicare HMO | Attending: Hematology and Oncology | Admitting: Physician Assistant

## 2021-07-06 ENCOUNTER — Other Ambulatory Visit: Payer: Self-pay

## 2021-07-06 VITALS — BP 136/46 | HR 70 | Temp 96.6°F | Resp 17 | Wt 199.0 lb

## 2021-07-06 DIAGNOSIS — K7460 Unspecified cirrhosis of liver: Secondary | ICD-10-CM | POA: Diagnosis not present

## 2021-07-06 DIAGNOSIS — D649 Anemia, unspecified: Secondary | ICD-10-CM | POA: Insufficient documentation

## 2021-07-06 DIAGNOSIS — D61818 Other pancytopenia: Secondary | ICD-10-CM | POA: Insufficient documentation

## 2021-07-06 DIAGNOSIS — D696 Thrombocytopenia, unspecified: Secondary | ICD-10-CM | POA: Insufficient documentation

## 2021-07-06 LAB — CBC WITH DIFFERENTIAL (CANCER CENTER ONLY)
Abs Immature Granulocytes: 0.01 10*3/uL (ref 0.00–0.07)
Basophils Absolute: 0 10*3/uL (ref 0.0–0.1)
Basophils Relative: 1 %
Eosinophils Absolute: 0.1 10*3/uL (ref 0.0–0.5)
Eosinophils Relative: 5 %
HCT: 24.1 % — ABNORMAL LOW (ref 39.0–52.0)
Hemoglobin: 7.7 g/dL — ABNORMAL LOW (ref 13.0–17.0)
Immature Granulocytes: 1 %
Lymphocytes Relative: 20 %
Lymphs Abs: 0.4 10*3/uL — ABNORMAL LOW (ref 0.7–4.0)
MCH: 27.5 pg (ref 26.0–34.0)
MCHC: 32 g/dL (ref 30.0–36.0)
MCV: 86.1 fL (ref 80.0–100.0)
Monocytes Absolute: 0.2 10*3/uL (ref 0.1–1.0)
Monocytes Relative: 10 %
Neutro Abs: 1.3 10*3/uL — ABNORMAL LOW (ref 1.7–7.7)
Neutrophils Relative %: 63 %
Platelet Count: 101 10*3/uL — ABNORMAL LOW (ref 150–400)
RBC: 2.8 MIL/uL — ABNORMAL LOW (ref 4.22–5.81)
RDW: 20.2 % — ABNORMAL HIGH (ref 11.5–15.5)
WBC Count: 2 10*3/uL — ABNORMAL LOW (ref 4.0–10.5)
nRBC: 0 % (ref 0.0–0.2)

## 2021-07-06 NOTE — Telephone Encounter (Signed)
Verbals orders given.

## 2021-07-07 DIAGNOSIS — E785 Hyperlipidemia, unspecified: Secondary | ICD-10-CM | POA: Diagnosis not present

## 2021-07-07 DIAGNOSIS — R69 Illness, unspecified: Secondary | ICD-10-CM | POA: Diagnosis not present

## 2021-07-07 DIAGNOSIS — I272 Pulmonary hypertension, unspecified: Secondary | ICD-10-CM | POA: Diagnosis not present

## 2021-07-07 DIAGNOSIS — E669 Obesity, unspecified: Secondary | ICD-10-CM | POA: Diagnosis not present

## 2021-07-07 DIAGNOSIS — E119 Type 2 diabetes mellitus without complications: Secondary | ICD-10-CM | POA: Diagnosis not present

## 2021-07-07 DIAGNOSIS — K279 Peptic ulcer, site unspecified, unspecified as acute or chronic, without hemorrhage or perforation: Secondary | ICD-10-CM | POA: Diagnosis not present

## 2021-07-07 DIAGNOSIS — M1990 Unspecified osteoarthritis, unspecified site: Secondary | ICD-10-CM | POA: Diagnosis not present

## 2021-07-07 DIAGNOSIS — K7682 Hepatic encephalopathy: Secondary | ICD-10-CM | POA: Diagnosis not present

## 2021-07-07 DIAGNOSIS — K922 Gastrointestinal hemorrhage, unspecified: Secondary | ICD-10-CM | POA: Diagnosis not present

## 2021-07-07 DIAGNOSIS — I110 Hypertensive heart disease with heart failure: Secondary | ICD-10-CM | POA: Diagnosis not present

## 2021-07-07 DIAGNOSIS — I5032 Chronic diastolic (congestive) heart failure: Secondary | ICD-10-CM | POA: Diagnosis not present

## 2021-07-07 DIAGNOSIS — M47819 Spondylosis without myelopathy or radiculopathy, site unspecified: Secondary | ICD-10-CM | POA: Diagnosis not present

## 2021-07-07 DIAGNOSIS — M109 Gout, unspecified: Secondary | ICD-10-CM | POA: Diagnosis not present

## 2021-07-07 DIAGNOSIS — G4733 Obstructive sleep apnea (adult) (pediatric): Secondary | ICD-10-CM | POA: Diagnosis not present

## 2021-07-07 DIAGNOSIS — J4520 Mild intermittent asthma, uncomplicated: Secondary | ICD-10-CM | POA: Diagnosis not present

## 2021-07-07 DIAGNOSIS — D509 Iron deficiency anemia, unspecified: Secondary | ICD-10-CM | POA: Diagnosis not present

## 2021-07-07 DIAGNOSIS — G8929 Other chronic pain: Secondary | ICD-10-CM | POA: Diagnosis not present

## 2021-07-07 DIAGNOSIS — H538 Other visual disturbances: Secondary | ICD-10-CM | POA: Diagnosis not present

## 2021-07-07 DIAGNOSIS — R32 Unspecified urinary incontinence: Secondary | ICD-10-CM | POA: Diagnosis not present

## 2021-07-07 DIAGNOSIS — K219 Gastro-esophageal reflux disease without esophagitis: Secondary | ICD-10-CM | POA: Diagnosis not present

## 2021-07-07 DIAGNOSIS — K7031 Alcoholic cirrhosis of liver with ascites: Secondary | ICD-10-CM | POA: Diagnosis not present

## 2021-07-07 DIAGNOSIS — D61818 Other pancytopenia: Secondary | ICD-10-CM | POA: Diagnosis not present

## 2021-07-07 DIAGNOSIS — J449 Chronic obstructive pulmonary disease, unspecified: Secondary | ICD-10-CM | POA: Diagnosis not present

## 2021-07-07 DIAGNOSIS — I2510 Atherosclerotic heart disease of native coronary artery without angina pectoris: Secondary | ICD-10-CM | POA: Diagnosis not present

## 2021-07-07 DIAGNOSIS — D696 Thrombocytopenia, unspecified: Secondary | ICD-10-CM | POA: Diagnosis not present

## 2021-07-08 ENCOUNTER — Inpatient Hospital Stay: Payer: Medicare HMO

## 2021-07-08 ENCOUNTER — Other Ambulatory Visit: Payer: Self-pay | Admitting: *Deleted

## 2021-07-08 ENCOUNTER — Other Ambulatory Visit: Payer: Self-pay | Admitting: Physician Assistant

## 2021-07-08 ENCOUNTER — Other Ambulatory Visit: Payer: Self-pay

## 2021-07-08 DIAGNOSIS — D649 Anemia, unspecified: Secondary | ICD-10-CM

## 2021-07-08 DIAGNOSIS — K7460 Unspecified cirrhosis of liver: Secondary | ICD-10-CM | POA: Diagnosis not present

## 2021-07-08 DIAGNOSIS — D696 Thrombocytopenia, unspecified: Secondary | ICD-10-CM

## 2021-07-08 DIAGNOSIS — D61818 Other pancytopenia: Secondary | ICD-10-CM

## 2021-07-08 LAB — PREPARE RBC (CROSSMATCH)

## 2021-07-08 LAB — CBC WITH DIFFERENTIAL (CANCER CENTER ONLY)
Abs Immature Granulocytes: 0 10*3/uL (ref 0.00–0.07)
Basophils Absolute: 0 10*3/uL (ref 0.0–0.1)
Basophils Relative: 1 %
Eosinophils Absolute: 0.1 10*3/uL (ref 0.0–0.5)
Eosinophils Relative: 5 %
HCT: 24 % — ABNORMAL LOW (ref 39.0–52.0)
Hemoglobin: 7.6 g/dL — ABNORMAL LOW (ref 13.0–17.0)
Immature Granulocytes: 0 %
Lymphocytes Relative: 26 %
Lymphs Abs: 0.6 10*3/uL — ABNORMAL LOW (ref 0.7–4.0)
MCH: 26.9 pg (ref 26.0–34.0)
MCHC: 31.7 g/dL (ref 30.0–36.0)
MCV: 84.8 fL (ref 80.0–100.0)
Monocytes Absolute: 0.3 10*3/uL (ref 0.1–1.0)
Monocytes Relative: 13 %
Neutro Abs: 1.3 10*3/uL — ABNORMAL LOW (ref 1.7–7.7)
Neutrophils Relative %: 55 %
Platelet Count: 102 10*3/uL — ABNORMAL LOW (ref 150–400)
RBC: 2.83 MIL/uL — ABNORMAL LOW (ref 4.22–5.81)
RDW: 19.9 % — ABNORMAL HIGH (ref 11.5–15.5)
WBC Count: 2.3 10*3/uL — ABNORMAL LOW (ref 4.0–10.5)
nRBC: 0 % (ref 0.0–0.2)

## 2021-07-08 LAB — RETIC PANEL
Immature Retic Fract: 21.8 % — ABNORMAL HIGH (ref 2.3–15.9)
RBC.: 2.74 MIL/uL — ABNORMAL LOW (ref 4.22–5.81)
Retic Count, Absolute: 64.9 10*3/uL (ref 19.0–186.0)
Retic Ct Pct: 2.4 % (ref 0.4–3.1)
Reticulocyte Hemoglobin: 24.2 pg — ABNORMAL LOW (ref >27.9)

## 2021-07-08 LAB — SAMPLE TO BLOOD BANK

## 2021-07-08 MED ORDER — DIPHENHYDRAMINE HCL 25 MG PO CAPS
25.0000 mg | ORAL_CAPSULE | Freq: Once | ORAL | Status: AC
  Administered 2021-07-08: 25 mg via ORAL
  Filled 2021-07-08: qty 1

## 2021-07-08 MED ORDER — SODIUM CHLORIDE 0.9% IV SOLUTION
250.0000 mL | Freq: Once | INTRAVENOUS | Status: AC
  Administered 2021-07-08: 250 mL via INTRAVENOUS

## 2021-07-08 MED ORDER — ACETAMINOPHEN 325 MG PO TABS
650.0000 mg | ORAL_TABLET | Freq: Once | ORAL | Status: AC
  Administered 2021-07-08: 650 mg via ORAL
  Filled 2021-07-08: qty 2

## 2021-07-08 NOTE — Progress Notes (Signed)
Pt tolerated prbc infusion well. Advised pt and pt wife to call facility with concerns. Pt and wife verbalized understanding. Pt was given avs and transported out in w/c to lobby.

## 2021-07-08 NOTE — Progress Notes (Signed)
[place]  PROGRESS NOTE  Patient Care Team: [name], MD as PCP - General (Internal Medicine) Nahser, Wonda Cheng, MD as PCP - Cardiology (Cardiology) Clance, Armando Reichert, MD as Consulting Physician (Pulmonary Disease) Greer Pickerel, MD as Consulting Physician (General Surgery) Wonda Horner, MD (Gastroenterology) Rana Snare, MD (Inactive) (Urology) Hayden Pedro, MD (Ophthalmology)  CHIEF COMPLAINTS/PURPOSE OF CONSULTATION:  Pancytopenia  HISTORY OF PRESENTING ILLNESS:   [name] 75 y.o. male is here because of pancytopenia from liver cirrhosis. Since the last visit on 05/20/2021, patient has been admitted twice for hepatic encephalopathy.  He was most recently discharged from the hospital on 06/18/2021  On exam today, [name] reports that he is doing better since the most recent hospitalization.  He is able to ambulate more using a walker.  His appetite is unchanged.  He nausea,vomiting or abdominal pain.  He reports regular bowel movements without any diarrhea or constipation.  He denies he reports easy bruising predominantly in his forearms.  He denies any signs of bleeding including hematochezia or melena.  Patient has persistent bilateral lower extremity edema that is currently managed with spironolactone and furosemide.  He denies any fevers, chills, night sweats, chest pain, shortness of breath or cough.  He has no other complaints.  Rest of the 10 point ROS is below   MEDICAL HISTORY:  Past Medical History:  Diagnosis Date   Arthritis    "joints tighten up sometimes" (03/27/2104)   Carcinoma of liver, hepatocellular ([place]) 06/30/2020   Chronic lower back pain    Coronary artery disease involving native coronary artery of native heart without angina pectoris    Severe left main disease at catheterization July 2015  CABG x3 with a LIMA to the LAD, SVG to the OM, SVG to the PDA on 03/31/14. EF 60% by cath.    Diastolic heart failure (HCC)    GERD  without esophagitis 08/04/2010   Hepatic cirrhosis ([place])    a. Dx 01/2014 - CT a/p    History of blood transfusion    "related to bleeding ulcers"   History of concussion    1976--  NO RESIDUAL   History of GI bleed    a. UGIB 07/2012;  b. 01/2014 admission with GIB/FOB stool req 1U prbc's->EGD showed portal gastropathy, barrett's esoph, and chronic active h. pylori gastritis.   History of gout    2007 &  2008  LEFT LEG-- NO ISSUE SINCE   Hyperlipidemia    Iron deficiency anemia    Kidney stones    OA (osteoarthritis of spine)    LOWER BACK--  INTERMITTANT LEFT LEG NUMBNESS   OSA (obstructive sleep apnea)    PULMOLOGIST-  DR CLANCE--  MODERATE OSA  STARTED CPAP 2012--  BUT CURRENTLY HAS NOT USED PAST 6 MONTHS   Phimosis    a. s/p circumcision 2015.   Type 2 diabetes mellitus ([place])    Unspecified essential hypertension     SURGICAL HISTORY: Past Surgical History:  Procedure Laterality Date   ANKLE FRACTURE SURGERY Right 1989   "plate put in"   APPENDECTOMY  05-16-2004   open   BIOPSY  01/08/2021   Procedure: BIOPSY;  Surgeon: Wilford Corner, MD;  Location: [facility];  Service: Endoscopy;;   BIOPSY  05/02/2021   Procedure: BIOPSY;  Surgeon: Arta Silence, MD;  Location: [facility];  Service: Endoscopy;;   CIRCUMCISION N/A 09/09/2013   Procedure: CIRCUMCISION ADULT;  Surgeon: Bernestine Amass, MD;  Location:  [place];  Service: Urology;  Laterality: N/A;   COLECTOMY  05-16-2004   COLONOSCOPY N/A 05/02/2021   Procedure: COLONOSCOPY;  Surgeon: Arta Silence, MD;  [facility];  Service: Endoscopy;  Laterality: N/A;   COLONOSCOPY WITH PROPOFOL N/A 11/20/2020   Procedure: COLONOSCOPY WITH PROPOFOL;  Surgeon: Ronald Lobo, MD;  [facility];  Service: Endoscopy;  Laterality: N/A;   CORONARY ANGIOPLASTY WITH STENT PLACEMENT  06/28/2012  DR COOPER   PCI W/  X1 DES to [place]. LAD/  LM  40% OSTIAL & 50-60% DISTAL /  50% PROX LCX/  30-40% PROX RCA  & 50% MID RCA/   LVEF 65-70%   CORONARY ARTERY BYPASS GRAFT N/A 03/31/2014   Procedure: CORONARY ARTERY BYPASS GRAFTING (CABG) times 3 using left internal mammary artery and right saphenous vein.;  Surgeon: Melrose Nakayama, MD;  Location: [place];  Service: Open Heart Surgery;  Laterality: N/A;   DOBUTAMINE STRESS ECHO  06-08-2012   MODERATE HYPOKINESIS/ ISCHEMIA MID INFERIOR WALL   ESOPHAGOGASTRODUODENOSCOPY  08/15/2012   Procedure: ESOPHAGOGASTRODUODENOSCOPY (EGD);  Surgeon: Wonda Horner, MD;  Location[facility] ENDOSCOPY;  Service: Endoscopy;  Laterality: N/A;   ESOPHAGOGASTRODUODENOSCOPY N/A 02/17/2014   Procedure: ESOPHAGOGASTRODUODENOSCOPY (EGD);  Surgeon: Jeryl Columbia, MD;  Location: [facility] ENDOSCOPY;  Service: Endoscopy;  Laterality: N/A;   ESOPHAGOGASTRODUODENOSCOPY N/A 04/17/2020   Procedure: ESOPHAGOGASTRODUODENOSCOPY (EGD);  Surgeon: Ronnette Juniper, MD;  Location: [place];  Service: Gastroenterology;  Laterality: N/A;   ESOPHAGOGASTRODUODENOSCOPY N/A 09/11/2020   Procedure: ESOPHAGOGASTRODUODENOSCOPY (EGD);  Surgeon: Clarene Essex, MD;  Location: [facility];  Service: Endoscopy;  Laterality: N/A;   ESOPHAGOGASTRODUODENOSCOPY  10/09/2020   ESOPHAGOGASTRODUODENOSCOPY N/A 10/09/2020   Procedure: ESOPHAGOGASTRODUODENOSCOPY (EGD);  Surgeon: Otis Brace, MD;  Location: [facility] ENDOSCOPY;  Service: Gastroenterology;  Laterality: N/A;   ESOPHAGOGASTRODUODENOSCOPY N/A 01/08/2021   Procedure: ESOPHAGOGASTRODUODENOSCOPY (EGD);  Surgeon: Wilford Corner, MD;  Location: [facility];  Service: Endoscopy;  Laterality: N/A;   ESOPHAGOGASTRODUODENOSCOPY N/A 05/02/2021   Procedure: ESOPHAGOGASTRODUODENOSCOPY (EGD);  Surgeon: Arta Silence, MD;  Location: [facility];  Service: Endoscopy;  Laterality: N/A;   ESOPHAGOGASTRODUODENOSCOPY (EGD) WITH PROPOFOL N/A 09/30/2017   Procedure: ESOPHAGOGASTRODUODENOSCOPY (EGD) WITH PROPOFOL;  Surgeon: Wonda Horner, MD;  Location: [facility] ENDOSCOPY;  Service: Endoscopy;   Laterality: N/A;   ESOPHAGOGASTRODUODENOSCOPY (EGD) WITH PROPOFOL N/A 12/20/2017   Procedure: ESOPHAGOGASTRODUODENOSCOPY (EGD) WITH PROPOFOL;  Surgeon: Clarene Essex, MD;  Location: [place];  Service: Endoscopy;  Laterality: N/A;   ESOPHAGOGASTRODUODENOSCOPY (EGD) WITH PROPOFOL N/A 08/10/2020   Procedure: ESOPHAGOGASTRODUODENOSCOPY (EGD) WITH PROPOFOL;  Surgeon: Wilford Corner, MD;  Location: [place];  Service: Endoscopy;  Laterality: N/A;   ESOPHAGOGASTRODUODENOSCOPY (EGD) WITH PROPOFOL N/A 11/20/2020   Procedure: ESOPHAGOGASTRODUODENOSCOPY (EGD) WITH PROPOFOL;  Surgeon: Ronald Lobo, MD;  [facility];  Service: Endoscopy;  Laterality: N/A;   ESOPHAGOGASTRODUODENOSCOPY (EGD) WITH PROPOFOL N/A 02/28/2021   Procedure: ESOPHAGOGASTRODUODENOSCOPY (EGD) WITH PROPOFOL;  Surgeon: Clarene Essex, MD;  [facility];  Service: Endoscopy;  Laterality: N/A;   GIVENS CAPSULE STUDY N/A 11/20/2020   Procedure: GIVENS CAPSULE STUDY;  Surgeon: Ronald Lobo, MD;  [facility];  Service: Endoscopy;  Laterality: N/A;   HEMOSTASIS CLIP PLACEMENT  05/02/2021   Procedure: HEMOSTASIS CLIP PLACEMENT;  Surgeon: Arta Silence, MD;  [facility];  Service: Endoscopy;;   HERNIA REPAIR     INTRAOPERATIVE TRANSESOPHAGEAL ECHOCARDIOGRAM N/A 03/31/2014   Procedure: INTRAOPERATIVE TRANSESOPHAGEAL ECHOCARDIOGRAM;  Surgeon: Melrose Nakayama, MD;  Location: [place];  Service: Open Heart Surgery;  Laterality: N/A;  IR ANGIOGRAM SELECTIVE EACH ADDITIONAL VESSEL  06/16/2020   IR ANGIOGRAM SELECTIVE EACH ADDITIONAL VESSEL  06/16/2020   IR ANGIOGRAM SELECTIVE EACH ADDITIONAL VESSEL  06/16/2020   IR ANGIOGRAM SELECTIVE EACH ADDITIONAL VESSEL  06/16/2020   IR ANGIOGRAM SELECTIVE EACH ADDITIONAL VESSEL  06/16/2020   IR ANGIOGRAM SELECTIVE EACH ADDITIONAL VESSEL  06/16/2020   IR ANGIOGRAM SELECTIVE EACH ADDITIONAL VESSEL  06/16/2020   IR ANGIOGRAM SELECTIVE EACH ADDITIONAL VESSEL   06/16/2020   IR ANGIOGRAM SELECTIVE EACH ADDITIONAL VESSEL  06/16/2020   IR ANGIOGRAM SELECTIVE EACH ADDITIONAL VESSEL  06/30/2020   IR ANGIOGRAM SELECTIVE EACH ADDITIONAL VESSEL  06/30/2020   IR ANGIOGRAM VISCERAL SELECTIVE  06/16/2020   IR ANGIOGRAM VISCERAL SELECTIVE  06/30/2020   IR EMBO ARTERIAL NOT HEMORR HEMANG INC GUIDE ROADMAPPING  06/16/2020   IR EMBO TUMOR ORGAN ISCHEMIA INFARCT INC GUIDE ROADMAPPING  06/30/2020   IR EMBO VENOUS NOT HEMORR HEMANG  INC GUIDE ROADMAPPING  01/13/2021   IR IVUS EACH ADDITIONAL NON CORONARY VESSEL  01/13/2021   IR RADIOLOGIST EVAL & MGMT  05/06/2020   IR RADIOLOGIST EVAL & MGMT  05/20/2020   IR RADIOLOGIST EVAL & MGMT  05/25/2021   IR TIPS  01/13/2021   IR US GUIDE VASC ACCESS RIGHT  06/16/2020   IR US GUIDE VASC ACCESS RIGHT  06/30/2020   IR US GUIDE VASC ACCESS RIGHT  01/13/2021   LAPAROSCOPIC UMBILICAL HERNIA REPAIR W/ MESH  06-06-2011   LEFT HEART CATHETERIZATION WITH CORONARY ANGIOGRAM N/A 03/28/2014   Procedure: LEFT HEART CATHETERIZATION WITH CORONARY ANGIOGRAM;  Surgeon: Sinclair Grooms, MD;  Location: [facility] CATH LAB;  Service: Cardiovascular;  Laterality: N/A;   LIPOMA EXCISION Left 08/25/2017   Procedure: EXCISION LIPOMA LEFT POSTERIOR THIGH;  Surgeon: Clovis Riley, MD;  Location: [place];  Service: General;  Laterality: Left;   NEPHROLITHOTOMY  1990'S   OPEN APPENDECTOMY W/ PARTIAL CECECTOMY  05-16-2004   PERCUTANEOUS CORONARY STENT INTERVENTION (PCI-S) N/A 06/28/2012   Procedure: PERCUTANEOUS CORONARY STENT INTERVENTION (PCI-S);  Surgeon: Sherren Mocha, MD;  Location: [facility] CATH LAB;  Service: Cardiovascular;  Laterality: N/A;   POLYPECTOMY  11/20/2020   Procedure: POLYPECTOMY;  Surgeon: Ronald Lobo, MD;  [facility];  Service: Endoscopy;;   POLYPECTOMY  05/02/2021   Procedure: POLYPECTOMY;  Surgeon: Arta Silence, MD;  [facility];  Service: Endoscopy;;   RADIOLOGY WITH ANESTHESIA N/A 01/13/2021   Procedure: IR WITH  ANESTHESIA - TIPS;  Surgeon: Suzette Battiest, MD;  Location: [place];  Service: Radiology;  Laterality: N/A;    SOCIAL HISTORY: Social History   Socioeconomic History   Marital status: Married    Spouse name: Not on file   Number of children: Not on file   Years of education: Not on file   Highest education level: Not on file  Occupational History   Not on file  Tobacco Use   Smoking status: Former    Packs/day: 2.00    Years: 40.00    Pack years: 80.00    Types: Cigarettes    Quit date: 08/29/1996    Years since quitting: 24.8   Smokeless tobacco: Never  Vaping Use   Vaping Use: Never used  Substance and Sexual Activity   Alcohol use: Not Currently    Alcohol/week: 8.0 standard drinks    Types: 8 Cans of beer per week    Comment: 2 beers every other day   Drug use: No   Sexual activity: Yes  Other Topics Concern  Not on file  Social History Narrative   Lives in [place] with wife, son, and son's family.   Social Determinants of Health   Financial Resource Strain: Medium Risk   Difficulty of Paying Living Expenses: Somewhat hard  Food Insecurity: No Food Insecurity   Worried About Charity fundraiser in the Last Year: Never true   Ran Out of Food in the Last Year: Never true  Transportation Needs: No Transportation Needs   Lack of Transportation (Medical): No   Lack of Transportation (Non-Medical): No  Physical Activity: Not on file  Stress: Not on file  Social Connections: Not on file  Intimate Partner Violence: Not on file    FAMILY HISTORY: Family History  Problem Relation Age of Onset   Lung cancer Sister    Cancer Sister        lung   Cancer Mother    Cancer Father        died in his 90s.   Cancer Brother        lung   Coronary artery disease Other    Diabetes Other    Colon cancer Other    Cancer Sister        lung    ALLERGIES:  is allergic to fluzone quadrivalent [influenza vac split quad].  MEDICATIONS:  Current Outpatient Medications   Medication Sig Dispense Refill   Accu-Chek Softclix Lancets lancets USE AS DIRECTED TO TEST BLOOD SUGAR FOUR TIMES DAILY 300 each 3   albuterol (VENTOLIN HFA) 108 (90 Base) MCG/ACT inhaler Inhale 2 puffs into the lungs every 6 (six) hours as needed for wheezing or shortness of breath. 8 g 0   ANORO ELLIPTA 62.5-25 MCG/ACT AEPB Inhale 1 puff into the lungs daily.     blood glucose meter kit and supplies Dispense based on patient and insurance preference. Use up to four times daily as directed. (FOR ICD-10 E10.9, E11.9). 1 each 0   ferrous sulfate 325 (65 FE) MG tablet Take 1 tablet (325 mg total) by mouth daily with breakfast. 90 tablet 0   furosemide (LASIX) 40 MG tablet Take 1 tablet (40 mg total) by mouth daily. 90 tablet 0   lactulose (CHRONULAC) 10 GM/15ML solution Take 45 mLs (30 g total) by mouth 3 (three) times daily. 1892 mL 1   pantoprazole (PROTONIX) 40 MG tablet Take 1 tablet (40 mg total) by mouth 2 (two) times daily before a meal.     rifaximin (XIFAXAN) 550 MG TABS tablet Take 1 tablet (550 mg total) by mouth 2 (two) times daily. 60 tablet 1   spironolactone (ALDACTONE) 50 MG tablet Take 1 tablet (50 mg total) by mouth daily. (Patient not taking: Reported on 07/06/2021) 30 tablet 1   No current facility-administered medications for this visit.    PHYSICAL EXAMINATION: ECOG PERFORMANCE STATUS: 1 - Symptomatic but completely ambulatory  Vitals:   07/06/21 1540  BP: (!) 136/46  Pulse: 70  Resp: 17  Temp: (!) 96.6 F (35.9 C)  SpO2: 91%   Filed Weights   07/06/21 1540  Weight: 199 lb (90.3 kg)    GENERAL:alert, no distress and comfortable SKIN: skin color, texture, turgor are normal, no rashes or significant lesions EYES: normal, conjunctiva are pink and non-injected, sclera clear OROPHARYNX:no exudate, no erythema and lips, buccal mucosa, and tongue normal  LUNGS: clear to auscultation and percussion with normal breathing effort HEART: regular rate & rhythm and no  murmurs , severe pitting BLE edema. ABDOMEN:abdomen soft, ascitic fluid present  Musculoskeletal:no cyanosis of digits and no clubbing  PSYCH: speech is not fluent. NEURO: no focal motor/sensory deficits  LABORATORY DATA:  I have reviewed the data as listed Lab Results  Component Value Date   WBC 2.0 (L) 07/06/2021   HGB 7.7 (L) 07/06/2021   HCT 24.1 (L) 07/06/2021   MCV 86.1 07/06/2021   PLT 101 (L) 07/06/2021     Chemistry      Component Value Date/Time   NA 134 (L) 06/18/2021 0250   NA 139 08/25/2020 1420   K 3.7 06/18/2021 0250   CL 106 06/18/2021 0250   CO2 22 06/18/2021 0250   BUN 20 06/18/2021 0250   BUN 26 08/25/2020 1420   CREATININE 1.13 06/18/2021 0250   CREATININE 1.13 05/20/2021 1444   CREATININE 0.75 06/09/2015 1609      Component Value Date/Time   CALCIUM 8.6 (L) 06/18/2021 0250   ALKPHOS 336 (H) 06/18/2021 0250   AST 58 (H) 06/18/2021 0250   AST 43 (H) 05/20/2021 1444   ALT 39 06/18/2021 0250   ALT 29 05/20/2021 1444   BILITOT 1.1 06/18/2021 0250   BILITOT 0.9 05/20/2021 1444      RADIOGRAPHIC STUDIES: I have personally reviewed the radiological images as listed and agreed with the findings in the report. No results found.  ASSESSMENT & PLAN:  John THERIEN is a 75 y.o. 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  #Pancytopenia 2/2 cirrhosis: --Current plan is to provide supportive care with transfusion support.  --Labs today show worsening anemia with Hgb 7.7, thrombocytopenia with Plt 101K.  --Plan to transfuse 1 unit of pRBC on 07/08/21.  --Most recent EGD and colonoscopy was from 05/02/2021. EGD findings included portal hypertensive gastropathy and, gastric varices, multiple non-bleeding angiectasias in the stomach, duodenal and gastric AVMs. Colonoscopy findings included diverticulosis in the sigmoid colon, colonic angioectasias and hemorrhoids.  --Plan to reach out to [facility] to request a follow up due to recent  decline in Hgb concerning for GI bleed.  --RTC in 1 week with labs and in 2 weeks with labs/visit.    #Hx of hepatocellular carcinoma: --Treated with Y 90 back in November 2021 --Dr. Earleen Newport from IR has ordered an MRI waiting to be scheduled.    Patient and his wife expressed understanding of the plan provided.   I have spent a total of 30 minutes minutes of face-to-face and non-face-to-face time, preparing to see the patient, obtaining and/or reviewing separately obtained history, performing a medically appropriate examination, counseling and educating the patient, ordering procedures,  communicating with other health care professionals, documenting clinical information in the electronic health record, and care coordination.   John Brigham, PA-C Hematology and Oncology [facility] [place] P: [phone]

## 2021-07-09 LAB — BPAM RBC
Blood Product Expiration Date: 202212112359
ISSUE DATE / TIME: 202211101229
Unit Type and Rh: 5100

## 2021-07-09 LAB — TYPE AND SCREEN
ABO/RH(D): O POS
Antibody Screen: NEGATIVE
Unit division: 0

## 2021-07-12 DIAGNOSIS — R69 Illness, unspecified: Secondary | ICD-10-CM | POA: Diagnosis not present

## 2021-07-12 DIAGNOSIS — K7030 Alcoholic cirrhosis of liver without ascites: Secondary | ICD-10-CM | POA: Diagnosis not present

## 2021-07-14 DIAGNOSIS — E669 Obesity, unspecified: Secondary | ICD-10-CM | POA: Diagnosis not present

## 2021-07-14 DIAGNOSIS — J449 Chronic obstructive pulmonary disease, unspecified: Secondary | ICD-10-CM | POA: Diagnosis not present

## 2021-07-14 DIAGNOSIS — K7682 Hepatic encephalopathy: Secondary | ICD-10-CM | POA: Diagnosis not present

## 2021-07-14 DIAGNOSIS — D61818 Other pancytopenia: Secondary | ICD-10-CM | POA: Diagnosis not present

## 2021-07-14 DIAGNOSIS — I5032 Chronic diastolic (congestive) heart failure: Secondary | ICD-10-CM | POA: Diagnosis not present

## 2021-07-14 DIAGNOSIS — J4520 Mild intermittent asthma, uncomplicated: Secondary | ICD-10-CM | POA: Diagnosis not present

## 2021-07-14 DIAGNOSIS — I2510 Atherosclerotic heart disease of native coronary artery without angina pectoris: Secondary | ICD-10-CM | POA: Diagnosis not present

## 2021-07-14 DIAGNOSIS — G8929 Other chronic pain: Secondary | ICD-10-CM | POA: Diagnosis not present

## 2021-07-14 DIAGNOSIS — I272 Pulmonary hypertension, unspecified: Secondary | ICD-10-CM | POA: Diagnosis not present

## 2021-07-14 DIAGNOSIS — H538 Other visual disturbances: Secondary | ICD-10-CM | POA: Diagnosis not present

## 2021-07-14 DIAGNOSIS — M1990 Unspecified osteoarthritis, unspecified site: Secondary | ICD-10-CM | POA: Diagnosis not present

## 2021-07-14 DIAGNOSIS — R32 Unspecified urinary incontinence: Secondary | ICD-10-CM | POA: Diagnosis not present

## 2021-07-14 DIAGNOSIS — K922 Gastrointestinal hemorrhage, unspecified: Secondary | ICD-10-CM | POA: Diagnosis not present

## 2021-07-14 DIAGNOSIS — E119 Type 2 diabetes mellitus without complications: Secondary | ICD-10-CM | POA: Diagnosis not present

## 2021-07-14 DIAGNOSIS — M109 Gout, unspecified: Secondary | ICD-10-CM | POA: Diagnosis not present

## 2021-07-14 DIAGNOSIS — M47819 Spondylosis without myelopathy or radiculopathy, site unspecified: Secondary | ICD-10-CM | POA: Diagnosis not present

## 2021-07-14 DIAGNOSIS — D696 Thrombocytopenia, unspecified: Secondary | ICD-10-CM | POA: Diagnosis not present

## 2021-07-14 DIAGNOSIS — K219 Gastro-esophageal reflux disease without esophagitis: Secondary | ICD-10-CM | POA: Diagnosis not present

## 2021-07-14 DIAGNOSIS — D500 Iron deficiency anemia secondary to blood loss (chronic): Secondary | ICD-10-CM | POA: Diagnosis not present

## 2021-07-14 DIAGNOSIS — R69 Illness, unspecified: Secondary | ICD-10-CM | POA: Diagnosis not present

## 2021-07-14 DIAGNOSIS — I110 Hypertensive heart disease with heart failure: Secondary | ICD-10-CM | POA: Diagnosis not present

## 2021-07-14 DIAGNOSIS — K7031 Alcoholic cirrhosis of liver with ascites: Secondary | ICD-10-CM | POA: Diagnosis not present

## 2021-07-14 DIAGNOSIS — K279 Peptic ulcer, site unspecified, unspecified as acute or chronic, without hemorrhage or perforation: Secondary | ICD-10-CM | POA: Diagnosis not present

## 2021-07-14 DIAGNOSIS — E785 Hyperlipidemia, unspecified: Secondary | ICD-10-CM | POA: Diagnosis not present

## 2021-07-14 DIAGNOSIS — G4733 Obstructive sleep apnea (adult) (pediatric): Secondary | ICD-10-CM | POA: Diagnosis not present

## 2021-07-15 ENCOUNTER — Other Ambulatory Visit: Payer: Self-pay | Admitting: Physician Assistant

## 2021-07-15 DIAGNOSIS — D649 Anemia, unspecified: Secondary | ICD-10-CM

## 2021-07-16 ENCOUNTER — Inpatient Hospital Stay: Payer: Medicare HMO

## 2021-07-16 DIAGNOSIS — D696 Thrombocytopenia, unspecified: Secondary | ICD-10-CM | POA: Diagnosis not present

## 2021-07-16 DIAGNOSIS — K7460 Unspecified cirrhosis of liver: Secondary | ICD-10-CM | POA: Diagnosis not present

## 2021-07-16 DIAGNOSIS — D61818 Other pancytopenia: Secondary | ICD-10-CM | POA: Diagnosis not present

## 2021-07-16 DIAGNOSIS — D649 Anemia, unspecified: Secondary | ICD-10-CM

## 2021-07-16 LAB — CBC WITH DIFFERENTIAL (CANCER CENTER ONLY)
Abs Immature Granulocytes: 0.01 10*3/uL (ref 0.00–0.07)
Basophils Absolute: 0 10*3/uL (ref 0.0–0.1)
Basophils Relative: 1 %
Eosinophils Absolute: 0.1 10*3/uL (ref 0.0–0.5)
Eosinophils Relative: 3 %
HCT: 24 % — ABNORMAL LOW (ref 39.0–52.0)
Hemoglobin: 7.6 g/dL — ABNORMAL LOW (ref 13.0–17.0)
Immature Granulocytes: 1 %
Lymphocytes Relative: 24 %
Lymphs Abs: 0.5 10*3/uL — ABNORMAL LOW (ref 0.7–4.0)
MCH: 27.2 pg (ref 26.0–34.0)
MCHC: 31.7 g/dL (ref 30.0–36.0)
MCV: 86 fL (ref 80.0–100.0)
Monocytes Absolute: 0.2 10*3/uL (ref 0.1–1.0)
Monocytes Relative: 10 %
Neutro Abs: 1.3 10*3/uL — ABNORMAL LOW (ref 1.7–7.7)
Neutrophils Relative %: 61 %
Platelet Count: 108 10*3/uL — ABNORMAL LOW (ref 150–400)
RBC: 2.79 MIL/uL — ABNORMAL LOW (ref 4.22–5.81)
RDW: 18.3 % — ABNORMAL HIGH (ref 11.5–15.5)
WBC Count: 2.1 10*3/uL — ABNORMAL LOW (ref 4.0–10.5)
nRBC: 0 % (ref 0.0–0.2)

## 2021-07-16 LAB — RETIC PANEL
Immature Retic Fract: 18.5 % — ABNORMAL HIGH (ref 2.3–15.9)
RBC.: 2.8 MIL/uL — ABNORMAL LOW (ref 4.22–5.81)
Retic Count, Absolute: 61 10*3/uL (ref 19.0–186.0)
Retic Ct Pct: 2.2 % (ref 0.4–3.1)
Reticulocyte Hemoglobin: 25.9 pg — ABNORMAL LOW (ref >27.9)

## 2021-07-16 LAB — FERRITIN: Ferritin: 36 ng/mL (ref 24–336)

## 2021-07-16 LAB — IRON AND TIBC
Iron: 111 ug/dL (ref 42–163)
Saturation Ratios: 40 % (ref 20–55)
TIBC: 280 ug/dL (ref 202–409)
UIBC: 169 ug/dL (ref 117–376)

## 2021-07-19 ENCOUNTER — Inpatient Hospital Stay: Payer: Medicare HMO

## 2021-07-19 ENCOUNTER — Other Ambulatory Visit: Payer: Self-pay | Admitting: *Deleted

## 2021-07-19 ENCOUNTER — Other Ambulatory Visit: Payer: Self-pay

## 2021-07-19 ENCOUNTER — Other Ambulatory Visit: Payer: Self-pay | Admitting: Physician Assistant

## 2021-07-19 DIAGNOSIS — K922 Gastrointestinal hemorrhage, unspecified: Secondary | ICD-10-CM | POA: Diagnosis not present

## 2021-07-19 DIAGNOSIS — I272 Pulmonary hypertension, unspecified: Secondary | ICD-10-CM | POA: Diagnosis not present

## 2021-07-19 DIAGNOSIS — E785 Hyperlipidemia, unspecified: Secondary | ICD-10-CM | POA: Diagnosis not present

## 2021-07-19 DIAGNOSIS — D696 Thrombocytopenia, unspecified: Secondary | ICD-10-CM | POA: Diagnosis not present

## 2021-07-19 DIAGNOSIS — J4520 Mild intermittent asthma, uncomplicated: Secondary | ICD-10-CM | POA: Diagnosis not present

## 2021-07-19 DIAGNOSIS — R69 Illness, unspecified: Secondary | ICD-10-CM | POA: Diagnosis not present

## 2021-07-19 DIAGNOSIS — K219 Gastro-esophageal reflux disease without esophagitis: Secondary | ICD-10-CM | POA: Diagnosis not present

## 2021-07-19 DIAGNOSIS — K7682 Hepatic encephalopathy: Secondary | ICD-10-CM | POA: Diagnosis not present

## 2021-07-19 DIAGNOSIS — G8929 Other chronic pain: Secondary | ICD-10-CM | POA: Diagnosis not present

## 2021-07-19 DIAGNOSIS — K7031 Alcoholic cirrhosis of liver with ascites: Secondary | ICD-10-CM | POA: Diagnosis not present

## 2021-07-19 DIAGNOSIS — D649 Anemia, unspecified: Secondary | ICD-10-CM | POA: Diagnosis not present

## 2021-07-19 DIAGNOSIS — K7460 Unspecified cirrhosis of liver: Secondary | ICD-10-CM | POA: Diagnosis not present

## 2021-07-19 DIAGNOSIS — G4733 Obstructive sleep apnea (adult) (pediatric): Secondary | ICD-10-CM | POA: Diagnosis not present

## 2021-07-19 DIAGNOSIS — D61818 Other pancytopenia: Secondary | ICD-10-CM

## 2021-07-19 DIAGNOSIS — I2510 Atherosclerotic heart disease of native coronary artery without angina pectoris: Secondary | ICD-10-CM | POA: Diagnosis not present

## 2021-07-19 DIAGNOSIS — I110 Hypertensive heart disease with heart failure: Secondary | ICD-10-CM | POA: Diagnosis not present

## 2021-07-19 DIAGNOSIS — M47819 Spondylosis without myelopathy or radiculopathy, site unspecified: Secondary | ICD-10-CM | POA: Diagnosis not present

## 2021-07-19 DIAGNOSIS — H538 Other visual disturbances: Secondary | ICD-10-CM | POA: Diagnosis not present

## 2021-07-19 DIAGNOSIS — R32 Unspecified urinary incontinence: Secondary | ICD-10-CM | POA: Diagnosis not present

## 2021-07-19 DIAGNOSIS — K279 Peptic ulcer, site unspecified, unspecified as acute or chronic, without hemorrhage or perforation: Secondary | ICD-10-CM | POA: Diagnosis not present

## 2021-07-19 DIAGNOSIS — D500 Iron deficiency anemia secondary to blood loss (chronic): Secondary | ICD-10-CM | POA: Diagnosis not present

## 2021-07-19 DIAGNOSIS — E669 Obesity, unspecified: Secondary | ICD-10-CM | POA: Diagnosis not present

## 2021-07-19 DIAGNOSIS — E119 Type 2 diabetes mellitus without complications: Secondary | ICD-10-CM | POA: Diagnosis not present

## 2021-07-19 DIAGNOSIS — I5032 Chronic diastolic (congestive) heart failure: Secondary | ICD-10-CM | POA: Diagnosis not present

## 2021-07-19 DIAGNOSIS — M1990 Unspecified osteoarthritis, unspecified site: Secondary | ICD-10-CM | POA: Diagnosis not present

## 2021-07-19 DIAGNOSIS — J449 Chronic obstructive pulmonary disease, unspecified: Secondary | ICD-10-CM | POA: Diagnosis not present

## 2021-07-19 DIAGNOSIS — M109 Gout, unspecified: Secondary | ICD-10-CM | POA: Diagnosis not present

## 2021-07-19 LAB — CBC WITH DIFFERENTIAL (CANCER CENTER ONLY)
Abs Immature Granulocytes: 0 10*3/uL (ref 0.00–0.07)
Basophils Absolute: 0 10*3/uL (ref 0.0–0.1)
Basophils Relative: 1 %
Eosinophils Absolute: 0.1 10*3/uL (ref 0.0–0.5)
Eosinophils Relative: 3 %
HCT: 23.3 % — ABNORMAL LOW (ref 39.0–52.0)
Hemoglobin: 7.3 g/dL — ABNORMAL LOW (ref 13.0–17.0)
Immature Granulocytes: 0 %
Lymphocytes Relative: 19 %
Lymphs Abs: 0.5 10*3/uL — ABNORMAL LOW (ref 0.7–4.0)
MCH: 27.1 pg (ref 26.0–34.0)
MCHC: 31.3 g/dL (ref 30.0–36.0)
MCV: 86.6 fL (ref 80.0–100.0)
Monocytes Absolute: 0.3 10*3/uL (ref 0.1–1.0)
Monocytes Relative: 11 %
Neutro Abs: 1.6 10*3/uL — ABNORMAL LOW (ref 1.7–7.7)
Neutrophils Relative %: 66 %
Platelet Count: 90 10*3/uL — ABNORMAL LOW (ref 150–400)
RBC: 2.69 MIL/uL — ABNORMAL LOW (ref 4.22–5.81)
RDW: 18.4 % — ABNORMAL HIGH (ref 11.5–15.5)
WBC Count: 2.4 10*3/uL — ABNORMAL LOW (ref 4.0–10.5)
nRBC: 0 % (ref 0.0–0.2)

## 2021-07-19 LAB — PREPARE RBC (CROSSMATCH)

## 2021-07-19 MED ORDER — SODIUM CHLORIDE 0.9% IV SOLUTION
250.0000 mL | Freq: Once | INTRAVENOUS | Status: AC
  Administered 2021-07-19: 250 mL via INTRAVENOUS

## 2021-07-19 MED ORDER — ACETAMINOPHEN 325 MG PO TABS
650.0000 mg | ORAL_TABLET | Freq: Once | ORAL | Status: AC
  Administered 2021-07-19: 650 mg via ORAL
  Filled 2021-07-19: qty 2

## 2021-07-19 NOTE — Patient Instructions (Signed)
Blood Transfusion, Adult, Care After This sheet gives you information about how to care for yourself after your procedure. Your doctor may also give you more specific instructions. If you have problems or questions, contact your doctor. What can I expect after the procedure? After the procedure, it is common to have: Bruising and soreness at the IV site. A headache. Follow these instructions at home: Insertion site care   Follow instructions from your doctor about how to take care of your insertion site. This is where an IV tube was put into your vein. Make sure you: Wash your hands with soap and water before and after you change your bandage (dressing). If you cannot use soap and water, use hand sanitizer. Change your bandage as told by your doctor. Check your insertion site every day for signs of infection. Check for: Redness, swelling, or pain. Bleeding from the site. Warmth. Pus or a bad smell. General instructions Take over-the-counter and prescription medicines only as told by your doctor. Rest as told by your doctor. Go back to your normal activities as told by your doctor. Keep all follow-up visits as told by your doctor. This is important. Contact a doctor if: You have itching or red, swollen areas of skin (hives). You feel worried or nervous (anxious). You feel weak after doing your normal activities. You have redness, swelling, warmth, or pain around the insertion site. You have blood coming from the insertion site, and the blood does not stop with pressure. You have pus or a bad smell coming from the insertion site. Get help right away if: You have signs of a serious reaction. This may be coming from an allergy or the body's defense system (immune system). Signs include: Trouble breathing or shortness of breath. Swelling of the face or feeling warm (flushed). Fever or chills. Head, chest, or back pain. Dark pee (urine) or blood in the pee. Widespread rash. Fast  heartbeat. Feeling dizzy or light-headed. You may receive your blood transfusion in an outpatient setting. If so, you will be told whom to contact to report any reactions. These symptoms may be an emergency. Do not wait to see if the symptoms will go away. Get medical help right away. Call your local emergency services (911 in the [place] Do not drive yourself to the hospital. Summary Bruising and soreness at the IV site are common. Check your insertion site every day for signs of infection. Rest as told by your doctor. Go back to your normal activities as told by your doctor. Get help right away if you have signs of a serious reaction. This information is not intended to replace advice given to you by your health care provider. Make sure you discuss any questions you have with your health care provider. Document Revised: 12/10/2020 Document Reviewed: 02/07/2019 Elsevier Patient Education  2022 Elsevier Inc.  

## 2021-07-20 LAB — TYPE AND SCREEN
ABO/RH(D): O POS
Antibody Screen: NEGATIVE
Unit division: 0

## 2021-07-20 LAB — BPAM RBC
Blood Product Expiration Date: 202212212359
ISSUE DATE / TIME: 202211211351
Unit Type and Rh: 5100

## 2021-07-23 ENCOUNTER — Observation Stay (HOSPITAL_COMMUNITY)
Admission: EM | Admit: 2021-07-23 | Discharge: 2021-07-25 | Disposition: A | Discharge status: Home / Self Care | Payer: Medicare HMO | Attending: Student | Admitting: Student

## 2021-07-23 ENCOUNTER — Emergency Department (HOSPITAL_COMMUNITY): Payer: Medicare HMO

## 2021-07-23 ENCOUNTER — Other Ambulatory Visit: Payer: Self-pay

## 2021-07-23 ENCOUNTER — Encounter (HOSPITAL_COMMUNITY): Payer: Self-pay

## 2021-07-23 DIAGNOSIS — R29810 Facial weakness: Secondary | ICD-10-CM | POA: Diagnosis not present

## 2021-07-23 DIAGNOSIS — Z8505 Personal history of malignant neoplasm of liver: Secondary | ICD-10-CM | POA: Diagnosis not present

## 2021-07-23 DIAGNOSIS — Z79899 Other long term (current) drug therapy: Secondary | ICD-10-CM | POA: Insufficient documentation

## 2021-07-23 DIAGNOSIS — I5030 Unspecified diastolic (congestive) heart failure: Secondary | ICD-10-CM | POA: Diagnosis present

## 2021-07-23 DIAGNOSIS — Z87891 Personal history of nicotine dependence: Secondary | ICD-10-CM | POA: Diagnosis not present

## 2021-07-23 DIAGNOSIS — J45909 Unspecified asthma, uncomplicated: Secondary | ICD-10-CM | POA: Insufficient documentation

## 2021-07-23 DIAGNOSIS — I5032 Chronic diastolic (congestive) heart failure: Secondary | ICD-10-CM | POA: Insufficient documentation

## 2021-07-23 DIAGNOSIS — J4520 Mild intermittent asthma, uncomplicated: Secondary | ICD-10-CM | POA: Diagnosis present

## 2021-07-23 DIAGNOSIS — R402421 Glasgow coma scale score 9-12, in the field [EMT or ambulance]: Secondary | ICD-10-CM | POA: Diagnosis not present

## 2021-07-23 DIAGNOSIS — K7682 Hepatic encephalopathy: Principal | ICD-10-CM

## 2021-07-23 DIAGNOSIS — E876 Hypokalemia: Secondary | ICD-10-CM | POA: Diagnosis present

## 2021-07-23 DIAGNOSIS — R404 Transient alteration of awareness: Secondary | ICD-10-CM | POA: Diagnosis not present

## 2021-07-23 DIAGNOSIS — Z743 Need for continuous supervision: Secondary | ICD-10-CM | POA: Diagnosis not present

## 2021-07-23 DIAGNOSIS — Z20822 Contact with and (suspected) exposure to covid-19: Secondary | ICD-10-CM | POA: Diagnosis not present

## 2021-07-23 DIAGNOSIS — I10 Essential (primary) hypertension: Secondary | ICD-10-CM | POA: Diagnosis not present

## 2021-07-23 DIAGNOSIS — K7290 Hepatic failure, unspecified without coma: Secondary | ICD-10-CM | POA: Diagnosis not present

## 2021-07-23 DIAGNOSIS — R4182 Altered mental status, unspecified: Secondary | ICD-10-CM | POA: Diagnosis not present

## 2021-07-23 DIAGNOSIS — R4781 Slurred speech: Secondary | ICD-10-CM | POA: Diagnosis not present

## 2021-07-23 DIAGNOSIS — Z951 Presence of aortocoronary bypass graft: Secondary | ICD-10-CM | POA: Diagnosis not present

## 2021-07-23 DIAGNOSIS — E119 Type 2 diabetes mellitus without complications: Secondary | ICD-10-CM | POA: Diagnosis not present

## 2021-07-23 DIAGNOSIS — K7460 Unspecified cirrhosis of liver: Secondary | ICD-10-CM | POA: Diagnosis present

## 2021-07-23 DIAGNOSIS — I2510 Atherosclerotic heart disease of native coronary artery without angina pectoris: Secondary | ICD-10-CM | POA: Diagnosis not present

## 2021-07-23 LAB — COMPREHENSIVE METABOLIC PANEL
ALT: 22 U/L (ref 0–44)
AST: 36 U/L (ref 15–41)
Albumin: 2.2 g/dL — ABNORMAL LOW (ref 3.5–5.0)
Alkaline Phosphatase: 157 U/L — ABNORMAL HIGH (ref 38–126)
Anion gap: 8 (ref 5–15)
BUN: 12 mg/dL (ref 8–23)
CO2: 23 mmol/L (ref 22–32)
Calcium: 8.1 mg/dL — ABNORMAL LOW (ref 8.9–10.3)
Chloride: 108 mmol/L (ref 98–111)
Creatinine, Ser: 1.12 mg/dL (ref 0.61–1.24)
GFR, Estimated: >60 mL/min (ref >60)
Glucose, Bld: 105 mg/dL — ABNORMAL HIGH (ref 70–99)
Potassium: 3 mmol/L — ABNORMAL LOW (ref 3.5–5.1)
Sodium: 139 mmol/L (ref 135–145)
Total Bilirubin: 1.1 mg/dL (ref 0.3–1.2)
Total Protein: 5.1 g/dL — ABNORMAL LOW (ref 6.5–8.1)

## 2021-07-23 LAB — CBC WITH DIFFERENTIAL/PLATELET
Abs Immature Granulocytes: 0 10*3/uL (ref 0.00–0.07)
Basophils Absolute: 0 10*3/uL (ref 0.0–0.1)
Basophils Relative: 1 %
Eosinophils Absolute: 0.1 10*3/uL (ref 0.0–0.5)
Eosinophils Relative: 4 %
HCT: 26.1 % — ABNORMAL LOW (ref 39.0–52.0)
Hemoglobin: 7.8 g/dL — ABNORMAL LOW (ref 13.0–17.0)
Immature Granulocytes: 0 %
Lymphocytes Relative: 27 %
Lymphs Abs: 0.5 10*3/uL — ABNORMAL LOW (ref 0.7–4.0)
MCH: 27.5 pg (ref 26.0–34.0)
MCHC: 29.9 g/dL — ABNORMAL LOW (ref 30.0–36.0)
MCV: 91.9 fL (ref 80.0–100.0)
Monocytes Absolute: 0.2 10*3/uL (ref 0.1–1.0)
Monocytes Relative: 12 %
Neutro Abs: 1 10*3/uL — ABNORMAL LOW (ref 1.7–7.7)
Neutrophils Relative %: 56 %
Platelets: 96 10*3/uL — ABNORMAL LOW (ref 150–400)
RBC: 2.84 MIL/uL — ABNORMAL LOW (ref 4.22–5.81)
RDW: 18.8 % — ABNORMAL HIGH (ref 11.5–15.5)
WBC: 1.7 10*3/uL — ABNORMAL LOW (ref 4.0–10.5)
nRBC: 0 % (ref 0.0–0.2)

## 2021-07-23 LAB — I-STAT ARTERIAL BLOOD GAS, ED
Acid-Base Excess: 2 mmol/L (ref 0.0–2.0)
Bicarbonate: 25.6 mmol/L (ref 20.0–28.0)
Calcium, Ion: 1.15 mmol/L (ref 1.15–1.40)
HCT: 25 % — ABNORMAL LOW (ref 39.0–52.0)
Hemoglobin: 8.5 g/dL — ABNORMAL LOW (ref 13.0–17.0)
O2 Saturation: 97 %
Potassium: 3.1 mmol/L — ABNORMAL LOW (ref 3.5–5.1)
Sodium: 141 mmol/L (ref 135–145)
TCO2: 27 mmol/L (ref 22–32)
pCO2 arterial: 33.9 mmHg (ref 32.0–48.0)
pH, Arterial: 7.486 — ABNORMAL HIGH (ref 7.350–7.450)
pO2, Arterial: 78 mmHg — ABNORMAL LOW (ref 83.0–108.0)

## 2021-07-23 LAB — PROTIME-INR
INR: 1.3 — ABNORMAL HIGH (ref 0.8–1.2)
Prothrombin Time: 15.8 seconds — ABNORMAL HIGH (ref 11.4–15.2)

## 2021-07-23 LAB — RESP PANEL BY RT-PCR (FLU A&B, COVID) ARPGX2
Influenza A by PCR: NEGATIVE
Influenza B by PCR: NEGATIVE
SARS Coronavirus 2 by RT PCR: NEGATIVE

## 2021-07-23 LAB — AMMONIA: Ammonia: 135 umol/L — ABNORMAL HIGH (ref 9–35)

## 2021-07-23 LAB — CBG MONITORING, ED: Glucose-Capillary: 105 mg/dL — ABNORMAL HIGH (ref 70–99)

## 2021-07-23 MED ORDER — POTASSIUM CHLORIDE CRYS ER 20 MEQ PO TBCR: 40.0000 meq | EXTENDED_RELEASE_TABLET | Freq: Two times a day (BID) | ORAL | Status: DC

## 2021-07-23 MED ORDER — LACTULOSE ENEMA
300.0000 mL | Freq: Two times a day (BID) | ORAL | Status: DC
  Administered 2021-07-24: 300 mL via RECTAL
  Filled 2021-07-23: qty 300

## 2021-07-23 MED ORDER — UMECLIDINIUM-VILANTEROL 62.5-25 MCG/ACT IN AEPB
1.0000 | INHALATION_SPRAY | Freq: Every day | RESPIRATORY_TRACT | Status: DC
  Administered 2021-07-24 – 2021-07-25 (×2): 1 via RESPIRATORY_TRACT
  Filled 2021-07-23: qty 14

## 2021-07-23 MED ORDER — POTASSIUM CHLORIDE 10 MEQ/100ML IV SOLN
10.0000 meq | INTRAVENOUS | Status: AC
  Administered 2021-07-23 (×2): 10 meq via INTRAVENOUS
  Filled 2021-07-23 (×2): qty 100

## 2021-07-23 MED ORDER — ONDANSETRON HCL 4 MG PO TABS: 4.0000 mg | ORAL_TABLET | Freq: Four times a day (QID) | ORAL | Status: DC | PRN

## 2021-07-23 MED ORDER — FUROSEMIDE 10 MG/ML IJ SOLN
20.0000 mg | Freq: Every day | INTRAMUSCULAR | Status: DC
  Administered 2021-07-24: 20 mg via INTRAVENOUS
  Filled 2021-07-23: qty 4

## 2021-07-23 MED ORDER — ALBUTEROL SULFATE (2.5 MG/3ML) 0.083% IN NEBU: 2.5000 mg | INHALATION_SOLUTION | RESPIRATORY_TRACT | Status: DC | PRN

## 2021-07-23 MED ORDER — LACTULOSE ENEMA
300.0000 mL | Freq: Once | ORAL | Status: AC
  Administered 2021-07-23: 300 mL via RECTAL
  Filled 2021-07-23: qty 300

## 2021-07-23 MED ORDER — ONDANSETRON HCL 4 MG/2ML IJ SOLN
4.0000 mg | Freq: Four times a day (QID) | INTRAMUSCULAR | Status: DC | PRN
  Administered 2021-07-24: 4 mg via INTRAVENOUS
  Filled 2021-07-23: qty 2

## 2021-07-23 MED ORDER — LACTULOSE 10 GM/15ML PO SOLN
30.0000 g | Freq: Once | ORAL | Status: DC
  Filled 2021-07-23: qty 60

## 2021-07-23 NOTE — ED Triage Notes (Signed)
Pt AMS, no last known well.  Pt seems to be bed bound per EMS.  Pt is unkept wife is poor historian. Appears pt was discharge 07/20/21

## 2021-07-23 NOTE — ED Notes (Signed)
NT changed pt brief and provided peri care.

## 2021-07-23 NOTE — ED Notes (Signed)
Attempted to administer medication.  Patient unable to take a sip of water.  Patient will open mouth, but will not attempt to drink.  Will attempt again shortly

## 2021-07-23 NOTE — H&P (Signed)
History and Physical    [name] DDU:[id number] DOB: Jul 01, 1946 DOA: 07/23/2021  PCP: [name], MD  Patient coming from: Home via EMS  I have personally briefly reviewed patient's old medical records in [place]  Chief Complaint: Confusion  HPI: [name] is a 75 y.o. male with medical history significant for liver cirrhosis, recurrent hepatic encephalopathy, hepatocellular carcinoma, history of GI bleeding s/p TIPS and embolization of gastric varices by IR in May 2022, HFpEF (EF 60-65%, G3 DD), pancytopenia, CAD who presents to the ED for evaluation of confusion.  History is limited from patient due to encephalopathy and is otherwise obtained from EDP and chart review.  Patient with frequent admissions for hepatic encephalopathy, last admitted 06/15/2021-06/18/2021.  Per ED documentation, EMS were called at home due to patient's confusion.  Spouse was not able to provide significant history to EMS.  Patient also not able to provide history of current situation.  He does know his name and that he is in [facility] was not able to give any further relevant information.  ED Course:  Initial vitals showed BP 134/60, pulse 61, RR 13, temp 98.2 F, SPO2 97% on room air.  Labs show sodium 139, potassium 3.0, bicarb 23, BUN 12, creatinine 1.12, serum glucose 105, AST 36, ALT 22, alk phos 157, total bilirubin 1.1, ammonia 135, INR 1.3, WBC 1.7, hemoglobin 7.8, platelets 96,000.  I-STAT ABG shows pH 7.46, PCO2 33.9, PO2 78.  COVID and influenza PCR negative.  CT head without contrast negative for acute intracranial abnormality.  Patient was ordered to receive lactulose 30 g and potassium 40 mEq orally.  The hospitalist service was consulted to admit for further evaluation and management.  Review of Systems:  Unable to obtain full review of systems due to encephalopathy.   Past Medical History:  Diagnosis Date   Arthritis    "joints tighten up sometimes"  (03/27/2104)   Carcinoma of liver, hepatocellular ([place]) 06/30/2020   Chronic lower back pain    Coronary artery disease involving native coronary artery of native heart without angina pectoris    Severe left main disease at catheterization July 2015  CABG x3 with a LIMA to the LAD, SVG to the OM, SVG to the PDA on 03/31/14. EF 60% by cath.    Diastolic heart failure (HCC)    GERD without esophagitis 08/04/2010   Hepatic cirrhosis ([place])    a. Dx 01/2014 - CT a/p    History of blood transfusion    "related to bleeding ulcers"   History of concussion    1976--  NO RESIDUAL   History of GI bleed    a. UGIB 07/2012;  b. 01/2014 admission with GIB/FOB stool req 1U prbc's->EGD showed portal gastropathy, barrett's esoph, and chronic active h. pylori gastritis.   History of gout    2007 &  2008  LEFT LEG-- NO ISSUE SINCE   Hyperlipidemia    Iron deficiency anemia    Kidney stones    OA (osteoarthritis of spine)    LOWER BACK--  INTERMITTANT LEFT LEG NUMBNESS   OSA (obstructive sleep apnea)    PULMOLOGIST-  DR CLANCE--  MODERATE OSA  STARTED CPAP 2012--  BUT CURRENTLY HAS NOT USED PAST 6 MONTHS   Phimosis    a. s/p circumcision 2015.   Type 2 diabetes mellitus ([place])    Unspecified essential hypertension     Past Surgical History:  Procedure Laterality Date   ANKLE FRACTURE SURGERY Right 1989   "  plate put in"   APPENDECTOMY  05-16-2004   open   BIOPSY  01/08/2021   Procedure: BIOPSY;  Surgeon: Wilford Corner, MD;  Location: [facility];  Service: Endoscopy;;   BIOPSY  05/02/2021   Procedure: BIOPSY;  Surgeon: Arta Silence, MD;  Location: [facility];  Service: Endoscopy;;   CIRCUMCISION N/A 09/09/2013   Procedure: CIRCUMCISION ADULT;  Surgeon: Bernestine Amass, MD;  Location: [facility];  Service: Urology;  Laterality: N/A;   COLECTOMY  05-16-2004   COLONOSCOPY N/A 05/02/2021   Procedure: COLONOSCOPY;  Surgeon: Arta Silence, MD;  [facility]: [facility];  Service:  Endoscopy;  Laterality: N/A;   COLONOSCOPY WITH PROPOFOL N/A 11/20/2020   Procedure: COLONOSCOPY WITH PROPOFOL;  Surgeon: Ronald Lobo, MD;  [facility]: [facility];  Service: Endoscopy;  Laterality: N/A;   CORONARY ANGIOPLASTY WITH STENT PLACEMENT  06/28/2012  DR COOPER   PCI W/  X1 DES to [place]. LAD/  LM  40% OSTIAL & 50-60% DISTAL /  50% PROX LCX/  30-40% PROX RCA & 50% MID RCA/   LVEF 65-70%   CORONARY ARTERY BYPASS GRAFT N/A 03/31/2014   Procedure: CORONARY ARTERY BYPASS GRAFTING (CABG) times 3 using left internal mammary artery and right saphenous vein.;  Surgeon: Melrose Nakayama, MD;  Location: [name];  Service: Open Heart Surgery;  Laterality: N/A;   DOBUTAMINE STRESS ECHO  06-08-2012   MODERATE HYPOKINESIS/ ISCHEMIA MID INFERIOR WALL   ESOPHAGOGASTRODUODENOSCOPY  08/15/2012   Procedure: ESOPHAGOGASTRODUODENOSCOPY (EGD);  Surgeon: Wonda Horner, MD;  Location: [facility] ENDOSCOPY;  Service: Endoscopy;  Laterality: N/A;   ESOPHAGOGASTRODUODENOSCOPY N/A 02/17/2014   Procedure: ESOPHAGOGASTRODUODENOSCOPY (EGD);  Surgeon: Jeryl Columbia, MD;  Location: [facility] ENDOSCOPY;  Service: Endoscopy;  Laterality: N/A;   ESOPHAGOGASTRODUODENOSCOPY N/A 04/17/2020   Procedure: ESOPHAGOGASTRODUODENOSCOPY (EGD);  Surgeon: Ronnette Juniper, MD;  Location: [place];  Service: Gastroenterology;  Laterality: N/A;   ESOPHAGOGASTRODUODENOSCOPY N/A 09/11/2020   Procedure: ESOPHAGOGASTRODUODENOSCOPY (EGD);  Surgeon: Clarene Essex, MD;  Location: [facility];  Service: Endoscopy;  Laterality: N/A;   ESOPHAGOGASTRODUODENOSCOPY  10/09/2020   ESOPHAGOGASTRODUODENOSCOPY N/A 10/09/2020   Procedure: ESOPHAGOGASTRODUODENOSCOPY (EGD);  Surgeon: Otis Brace, MD;  Location: [facility] ENDOSCOPY;  Service: Gastroenterology;  Laterality: N/A;   ESOPHAGOGASTRODUODENOSCOPY N/A 01/08/2021   Procedure: ESOPHAGOGASTRODUODENOSCOPY (EGD);  Surgeon: Wilford Corner, MD;  Location: [facility];  Service: Endoscopy;  Laterality: N/A;    ESOPHAGOGASTRODUODENOSCOPY N/A 05/02/2021   Procedure: ESOPHAGOGASTRODUODENOSCOPY (EGD);  Surgeon: Arta Silence, MD;  Location: [facility];  Service: Endoscopy;  Laterality: N/A;   ESOPHAGOGASTRODUODENOSCOPY (EGD) WITH PROPOFOL N/A 09/30/2017   Procedure: ESOPHAGOGASTRODUODENOSCOPY (EGD) WITH PROPOFOL;  Surgeon: Wonda Horner, MD;  Location: [facility] ENDOSCOPY;  Service: Endoscopy;  Laterality: N/A;   ESOPHAGOGASTRODUODENOSCOPY (EGD) WITH PROPOFOL N/A 12/20/2017   Procedure: ESOPHAGOGASTRODUODENOSCOPY (EGD) WITH PROPOFOL;  Surgeon: Clarene Essex, MD;  Location: [place];  Service: Endoscopy;  Laterality: N/A;   ESOPHAGOGASTRODUODENOSCOPY (EGD) WITH PROPOFOL N/A 08/10/2020   Procedure: ESOPHAGOGASTRODUODENOSCOPY (EGD) WITH PROPOFOL;  Surgeon: Wilford Corner, MD;  Location: [place];  Service: Endoscopy;  Laterality: N/A;   ESOPHAGOGASTRODUODENOSCOPY (EGD) WITH PROPOFOL N/A 11/20/2020   Procedure: ESOPHAGOGASTRODUODENOSCOPY (EGD) WITH PROPOFOL;  Surgeon: Ronald Lobo, MD;  [facility];  Service: Endoscopy;  Laterality: N/A;   ESOPHAGOGASTRODUODENOSCOPY (EGD) WITH PROPOFOL N/A 02/28/2021   Procedure: ESOPHAGOGASTRODUODENOSCOPY (EGD) WITH PROPOFOL;  Surgeon: Clarene Essex, MD;  [facility];  Service: Endoscopy;  Laterality: N/A;   GIVENS CAPSULE STUDY N/A 11/20/2020   Procedure: GIVENS CAPSULE STUDY;  Surgeon: Ronald Lobo, MD;  Location:  [facility];  Service: Endoscopy;  Laterality: N/A;   HEMOSTASIS CLIP PLACEMENT  05/02/2021   Procedure: HEMOSTASIS CLIP PLACEMENT;  Surgeon: Arta Silence, MD;  [facility];  Service: Endoscopy;;   HERNIA REPAIR     INTRAOPERATIVE TRANSESOPHAGEAL ECHOCARDIOGRAM N/A 03/31/2014   Procedure: INTRAOPERATIVE TRANSESOPHAGEAL ECHOCARDIOGRAM;  Surgeon: Melrose Nakayama, MD;  Location: [place];  Service: Open Heart Surgery;  Laterality: N/A;   IR ANGIOGRAM SELECTIVE EACH ADDITIONAL VESSEL  06/16/2020   IR ANGIOGRAM SELECTIVE EACH  ADDITIONAL VESSEL  06/16/2020   IR ANGIOGRAM SELECTIVE EACH ADDITIONAL VESSEL  06/16/2020   IR ANGIOGRAM SELECTIVE EACH ADDITIONAL VESSEL  06/16/2020   IR ANGIOGRAM SELECTIVE EACH ADDITIONAL VESSEL  06/16/2020   IR ANGIOGRAM SELECTIVE EACH ADDITIONAL VESSEL  06/16/2020   IR ANGIOGRAM SELECTIVE EACH ADDITIONAL VESSEL  06/16/2020   IR ANGIOGRAM SELECTIVE EACH ADDITIONAL VESSEL  06/16/2020   IR ANGIOGRAM SELECTIVE EACH ADDITIONAL VESSEL  06/16/2020   IR ANGIOGRAM SELECTIVE EACH ADDITIONAL VESSEL  06/30/2020   IR ANGIOGRAM SELECTIVE EACH ADDITIONAL VESSEL  06/30/2020   IR ANGIOGRAM VISCERAL SELECTIVE  06/16/2020   IR ANGIOGRAM VISCERAL SELECTIVE  06/30/2020   IR EMBO ARTERIAL NOT HEMORR HEMANG INC GUIDE ROADMAPPING  06/16/2020   IR EMBO TUMOR ORGAN ISCHEMIA INFARCT INC GUIDE ROADMAPPING  06/30/2020   IR EMBO VENOUS NOT HEMORR HEMANG  INC GUIDE ROADMAPPING  01/13/2021   IR IVUS EACH ADDITIONAL NON CORONARY VESSEL  01/13/2021   IR RADIOLOGIST EVAL & MGMT  05/06/2020   IR RADIOLOGIST EVAL & MGMT  05/20/2020   IR RADIOLOGIST EVAL & MGMT  05/25/2021   IR TIPS  01/13/2021   IR US GUIDE VASC ACCESS RIGHT  06/16/2020   IR US GUIDE VASC ACCESS RIGHT  06/30/2020   IR US GUIDE VASC ACCESS RIGHT  01/13/2021   LAPAROSCOPIC UMBILICAL HERNIA REPAIR W/ MESH  06-06-2011   LEFT HEART CATHETERIZATION WITH CORONARY ANGIOGRAM N/A 03/28/2014   Procedure: LEFT HEART CATHETERIZATION WITH CORONARY ANGIOGRAM;  Surgeon: Sinclair Grooms, MD;  Location: [facility] CATH LAB;  Service: Cardiovascular;  Laterality: N/A;   LIPOMA EXCISION Left 08/25/2017   Procedure: EXCISION LIPOMA LEFT POSTERIOR THIGH;  Surgeon: Clovis Riley, MD;  Location: [place];  Service: General;  Laterality: Left;   NEPHROLITHOTOMY  1990'S   OPEN APPENDECTOMY W/ PARTIAL CECECTOMY  05-16-2004   PERCUTANEOUS CORONARY STENT INTERVENTION (PCI-S) N/A 06/28/2012   Procedure: PERCUTANEOUS CORONARY STENT INTERVENTION (PCI-S);  Surgeon: Sherren Mocha, MD;  Location: [facility]  CATH LAB;  Service: Cardiovascular;  Laterality: N/A;   POLYPECTOMY  11/20/2020   Procedure: POLYPECTOMY;  Surgeon: Ronald Lobo, MD;  [facility];  Service: Endoscopy;;   POLYPECTOMY  05/02/2021   Procedure: POLYPECTOMY;  Surgeon: Arta Silence, MD;  [facility];  Service: Endoscopy;;   RADIOLOGY WITH ANESTHESIA N/A 01/13/2021   Procedure: IR WITH ANESTHESIA - TIPS;  Surgeon: Suzette Battiest, MD;  Location: [place];  Service: Radiology;  Laterality: N/A;    Social History:  reports that he quit smoking about 24 years ago. His smoking use included cigarettes. He has a 80.00 pack-year smoking history. He has never used smokeless tobacco. He reports that he does not currently use alcohol after a past usage of about 8.0 standard drinks per week. He reports that he does not use drugs.  Allergies  Allergen Reactions   Fluzone Quadrivalent [Influenza Vac Split Quad] Other (See Comments)    The patient stated, in 10/2020: "I am not taking any more  flu shots. It liked to have killed me."    Family History  Problem Relation Age of Onset   Lung cancer Sister    Cancer Sister        lung   Cancer Mother    Cancer Father        died in his 72s.   Cancer Brother        lung   Coronary artery disease Other    Diabetes Other    Colon cancer Other    Cancer Sister        lung     Prior to Admission medications   Medication Sig Start Date End Date Taking? Authorizing Provider  albuterol (VENTOLIN HFA) 108 (90 Base) MCG/ACT inhaler Inhale 2 puffs into the lungs every 6 (six) hours as needed for wheezing or shortness of breath. 03/01/21  Yes Aline August, MD  ferrous sulfate 325 (65 FE) MG tablet Take 1 tablet (325 mg total) by mouth daily with breakfast. 05/06/21 08/04/21 Yes Dahal, Marlowe Aschoff, MD  furosemide (LASIX) 40 MG tablet Take 1 tablet (40 mg total) by mouth daily. 05/05/21 08/03/21 Yes Dahal, Marlowe Aschoff, MD  lactulose (CHRONULAC) 10 GM/15ML solution Take 45 mLs (30 g total) by  mouth 3 (three) times daily. 03/31/21  Yes Debbe Odea, MD  pantoprazole (PROTONIX) 40 MG tablet Take 1 tablet (40 mg total) by mouth 2 (two) times daily before a meal. 06/18/21  Yes Ghimire, Henreitta Leber, MD  rifaximin (XIFAXAN) 550 MG TABS tablet Take 1 tablet (550 mg total) by mouth 2 (two) times daily. 06/18/21  Yes Ghimire, Henreitta Leber, MD  Accu-Chek Softclix Lancets lancets USE AS DIRECTED TO TEST BLOOD SUGAR FOUR TIMES DAILY 03/13/20   [name], MD  [facility] ELLIPTA 62.5-25 MCG/ACT AEPB Inhale 1 puff into the lungs daily.    [provider]  blood glucose meter kit and supplies Dispense based on patient and insurance preference. Use up to four times daily as directed. (FOR ICD-10 E10.9, E11.9). 03/12/20   [name], MD  spironolactone (ALDACTONE) 50 MG tablet Take 1 tablet (50 mg total) by mouth daily. Patient not taking: Reported on 07/23/2021 03/07/21   Jonetta Osgood, MD    Physical Exam: Vitals:   07/23/21 1845 07/23/21 1900 07/23/21 1915 07/23/21 1930  BP: (!) 115/57 (!) 121/54 (!) 131/56 (!) 133/58  Pulse: (!) 57 (!) 58 (!) 57 (!) 55  Resp: _0 Temp:      TempSrc:      SpO2: 99% 99% 99% 99%  Weight:      Height:       Exam limited due to encephalopathy. Constitutional: Chronically ill-appearing man resting supine in bed, NAD, calm, appears comfortable, staring with eyes wide open and does not make direct contact Eyes: PERRL, lids and conjunctivae normal ENMT: Mucous membranes are dry. Posterior pharynx clear of any exudate or lesions. Neck: normal, supple, no masses. Respiratory: Upper airway wheezing anteriorly. Normal respiratory effort. No accessory muscle use.  Cardiovascular: Regular rate and rhythm, no murmurs / rubs / gallops.  Chronic bilateral lower extremity lymphedema.  Abdomen: no tenderness, no masses palpated. Bowel sounds diminished.  Musculoskeletal: no clubbing / cyanosis. No joint deformity upper and lower extremities.   Good ROM of upper extremities, somewhat limited lower extremities due to lymphedema.  No contractures. Normal muscle tone.  Skin: Lymphedema bilateral lower extremities with stasis dermatitis changes.  Neurologic: Limited exam due to encephalopathy, sensation appears intact.  No obvious  focal deficit. Psychiatric: Awake and will intermittently answer questions, oriented to self and knows that he is in [facility] but not the year or situation.  Labs on Admission: I have personally reviewed following labs and imaging studies  CBC: Recent Labs  Lab 07/19/21 1204 07/23/21 1626 07/23/21 1723  WBC 2.4* 1.7*  --   NEUTROABS 1.6* 1.0*  --   HGB 7.3* 7.8* 8.5*  HCT 23.3* 26.1* 25.0*  MCV 86.6 91.9  --   PLT 90* 96*  --    Basic Metabolic Panel: Recent Labs  Lab 07/23/21 1626 07/23/21 1723  NA 139 141  K 3.0* 3.1*  CL 108  --   CO2 23  --   GLUCOSE 105*  --   BUN 12  --   CREATININE 1.12  --   CALCIUM 8.1*  --    GFR: Estimated Creatinine Clearance: 62 mL/min (by C-G formula based on SCr of 1.12 mg/dL). Liver Function Tests: Recent Labs  Lab 07/23/21 1626  AST 36  ALT 22  ALKPHOS 157*  BILITOT 1.1  PROT 5.1*  ALBUMIN 2.2*   No results for input(s): LIPASE, AMYLASE in the last 168 hours. Recent Labs  Lab 07/23/21 1627  AMMONIA 135*   Coagulation Profile: Recent Labs  Lab 07/23/21 1649  INR 1.3*   Cardiac Enzymes: No results for input(s): CKTOTAL, CKMB, CKMBINDEX, TROPONINI in the last 168 hours. BNP (last 3 results) No results for input(s): PROBNP in the last 8760 hours. HbA1C: No results for input(s): HGBA1C in the last 72 hours. CBG: Recent Labs  Lab 07/23/21 1631  GLUCAP 105*   Lipid Profile: No results for input(s): CHOL, HDL, LDLCALC, TRIG, CHOLHDL, LDLDIRECT in the last 72 hours. Thyroid Function Tests: No results for input(s): TSH, T4TOTAL, FREET4, T3FREE, THYROIDAB in the last 72 hours. Anemia Panel: No results for input(s): VITAMINB12,  FOLATE, FERRITIN, TIBC, IRON, RETICCTPCT in the last 72 hours. Urine analysis:    Component Value Date/Time   COLORURINE YELLOW 05/28/2021 0632   APPEARANCEUR CLEAR 05/28/2021 0632   LABSPEC 1.009 05/28/2021 0632   PHURINE 7.0 05/28/2021 0632   GLUCOSEU NEGATIVE 05/28/2021 0632   HGBUR SMALL (A) 05/28/2021 0632   BILIRUBINUR NEGATIVE 05/28/2021 0632   KETONESUR NEGATIVE 05/28/2021 0632   PROTEINUR NEGATIVE 05/28/2021 0632   UROBILINOGEN 0.2 03/29/2014 1546   NITRITE NEGATIVE 05/28/2021 0632   LEUKOCYTESUR TRACE (A) 05/28/2021 0632    Radiological Exams on Admission: CT Head Wo Contrast  Result Date: 07/23/2021 CLINICAL DATA:  Mental status change. EXAM: CT HEAD WITHOUT CONTRAST TECHNIQUE: Contiguous axial images were obtained from the base of the skull through the vertex without intravenous contrast. COMPARISON:  Head CT 05/28/2021. FINDINGS: Brain: No evidence of acute infarction, hemorrhage, hydrocephalus, extra-axial collection or mass lesion/mass effect. There is stable mild patchy periventricular and deep white matter hypodensity, likely chronic small vessel ischemic change. This is similar to the prior study. Vascular: Atherosclerotic calcifications are present within the cavernous internal carotid arteries. Skull: Normal. Negative for fracture or focal lesion. Sinuses/Orbits: No acute finding. Other: None. IMPRESSION: No acute intracranial abnormality. Electronically Signed   By: Ronney Asters M.D.   On: 07/23/2021 18:41    EKG: Personally reviewed. Sinus rhythm, incomplete RBBB, borderline prolonged PR interval.  Similar to prior.  Assessment/Plan Principal Problem:   Acute hepatic encephalopathy Active Problems:   Coronary artery disease involving native coronary artery of native heart without angina pectoris   Hepatic cirrhosis (HCC)   (HFpEF) heart failure with  preserved ejection fraction (HCC)   Asthma, mild intermittent   Hypokalemia   [name] is a 75 y.o. male  with medical history significant for liver cirrhosis, recurrent hepatic encephalopathy, hepatocellular carcinoma, history of GI bleeding s/p TIPS and embolization of gastric varices by IR in May 2022, HFpEF (EF 60-65%, G3 DD), pancytopenia, CAD who is admitted for acute hepatic encephalopathy.  Acute hepatic encephalopathy in setting of liver cirrhosis History of hepatocellular carcinoma: Admitted with recurrent hepatic encephalopathy.  Ammonia 135 on admission.  Unclear of medication adherence as an outpatient. -Lactulose enema ordered as he is unable to take orals, continue twice daily -Once able to take orals switch to lactulose 30 g 3 times daily and add back rifaximin -IV Lasix 20 mg daily until able to resume home oral 40 mg daily -Resume home spironolactone when taking orals  Hypokalemia: Supplement via IV.  Check magnesium and supplement if needed.  Pancytopenia: Secondary to liver cirrhosis.  Slightly worsened leukopenia from baseline otherwise hemoglobin and platelets relatively stable.  HFpEF: EF 60-65% with G3 DD on last TTE.  Has chronic lower extremity lymphedema but otherwise appears compensated.  Saturating well on room air.  Monitor strict I/O's. -IV Lasix 20 mg daily until able to switch to home oral 40 mg daily  Asthma: Continue Norco Ellipta and albuterol as needed.  CAD: Not on antiplatelet due to history of GI bleed.  DVT prophylaxis: SCDs Code Status: Full code Family Communication: None available on admission Disposition Plan: From home, dispo pending clinical progress Consults called: None Level of care: Med-Surg Admission status:  Status is: Observation  The patient remains OBS appropriate and will d/c before 2 midnights.  Zada Finders MD [facility]  If 7PM-7AM, please contact night-coverage [email or web address]  07/23/2021, 8:05 PM

## 2021-07-23 NOTE — ED Notes (Signed)
Son called and updated on plan of care.  Reports he thinks the patient might have taken both "his Friday and Saturday medications at once."

## 2021-07-23 NOTE — ED Provider Notes (Signed)
[facility] EMERGENCY DEPARTMENT Provider Note  CSN: [id number] Arrival date & time: 07/23/21 1620    History Chief Complaint  Patient presents with   Altered Mental Status    [name] is a 75 y.o. male with history of cirrhosis with recurrent encephalopathy brought by EMS for confusion, per EMS, wife was unable to provide any history. Patient is too confused to provide any history. Level 5 caveat applies.    Past Medical History:  Diagnosis Date   Arthritis    "joints tighten up sometimes" (03/27/2104)   Carcinoma of liver, hepatocellular ([place]) 06/30/2020   Chronic lower back pain    Coronary artery disease involving native coronary artery of native heart without angina pectoris    Severe left main disease at catheterization July 2015  CABG x3 with a LIMA to the LAD, SVG to the OM, SVG to the PDA on 03/31/14. EF 60% by cath.    Diastolic heart failure (HCC)    GERD without esophagitis 08/04/2010   Hepatic cirrhosis ([place])    a. Dx 01/2014 - CT a/p    History of blood transfusion    "related to bleeding ulcers"   History of concussion    1976--  NO RESIDUAL   History of GI bleed    a. UGIB 07/2012;  b. 01/2014 admission with GIB/FOB stool req 1U prbc's->EGD showed portal gastropathy, barrett's esoph, and chronic active h. pylori gastritis.   History of gout    2007 &  2008  LEFT LEG-- NO ISSUE SINCE   Hyperlipidemia    Iron deficiency anemia    Kidney stones    OA (osteoarthritis of spine)    LOWER BACK--  INTERMITTANT LEFT LEG NUMBNESS   OSA (obstructive sleep apnea)    PULMOLOGIST-  DR CLANCE--  MODERATE OSA  STARTED CPAP 2012--  BUT CURRENTLY HAS NOT USED PAST 6 MONTHS   Phimosis    a. s/p circumcision 2015.   Type 2 diabetes mellitus ([place])    Unspecified essential hypertension     Past Surgical History:  Procedure Laterality Date   ANKLE FRACTURE SURGERY Right 1989   "plate put in"   APPENDECTOMY  05-16-2004   open   BIOPSY  01/08/2021   Procedure: BIOPSY;   Surgeon: Wilford Corner, MD;  Location: [facility];  Service: Endoscopy;;   BIOPSY  05/02/2021   Procedure: BIOPSY;  Surgeon: Arta Silence, MD;  Location: [facility];  Service: Endoscopy;;   CIRCUMCISION N/A 09/09/2013   Procedure: CIRCUMCISION ADULT;  Surgeon: Bernestine Amass, MD;  Location: [facility];  Service: Urology;  Laterality: N/A;   COLECTOMY  05-16-2004   COLONOSCOPY N/A 05/02/2021   Procedure: COLONOSCOPY;  Surgeon: Arta Silence, MD;  Location: [facility];  Service: Endoscopy;  Laterality: N/A;   COLONOSCOPY WITH PROPOFOL N/A 11/20/2020   Procedure: COLONOSCOPY WITH PROPOFOL;  Surgeon: Ronald Lobo, MD;  Location: [facility];  Service: Endoscopy;  Laterality: N/A;   CORONARY ANGIOPLASTY WITH STENT PLACEMENT  06/28/2012  DR COOPER   PCI W/  X1 DES to [place]. LAD/  LM  40% OSTIAL & 50-60% DISTAL /  50% PROX LCX/  30-40% PROX RCA & 50% MID RCA/   LVEF 65-70%   CORONARY ARTERY BYPASS GRAFT N/A 03/31/2014   Procedure: CORONARY ARTERY BYPASS GRAFTING (CABG) times 3 using left internal mammary artery and right saphenous vein.;  Surgeon: Melrose Nakayama, MD;  Location: [place];  Service: Open Heart Surgery;  Laterality: N/A;  DOBUTAMINE STRESS ECHO  06-08-2012   MODERATE HYPOKINESIS/ ISCHEMIA MID INFERIOR WALL   ESOPHAGOGASTRODUODENOSCOPY  08/15/2012   Procedure: ESOPHAGOGASTRODUODENOSCOPY (EGD);  Surgeon: Wonda Horner, MD;  Location: [facility] ENDOSCOPY;  Service: Endoscopy;  Laterality: N/A;   ESOPHAGOGASTRODUODENOSCOPY N/A 02/17/2014   Procedure: ESOPHAGOGASTRODUODENOSCOPY (EGD);  Surgeon: Jeryl Columbia, MD;  Location: [facility] ENDOSCOPY;  Service: Endoscopy;  Laterality: N/A;   ESOPHAGOGASTRODUODENOSCOPY N/A 04/17/2020   Procedure: ESOPHAGOGASTRODUODENOSCOPY (EGD);  Surgeon: Ronnette Juniper, MD;  Location: [place];  Service: Gastroenterology;  Laterality: N/A;   ESOPHAGOGASTRODUODENOSCOPY N/A 09/11/2020   Procedure: ESOPHAGOGASTRODUODENOSCOPY (EGD);  Surgeon: Clarene Essex, MD;  Location: [facility];  Service: Endoscopy;  Laterality: N/A;   ESOPHAGOGASTRODUODENOSCOPY  10/09/2020   ESOPHAGOGASTRODUODENOSCOPY N/A 10/09/2020   Procedure: ESOPHAGOGASTRODUODENOSCOPY (EGD);  Surgeon: Otis Brace, MD;  Location: [facility] ENDOSCOPY;  Service: Gastroenterology;  Laterality: N/A;   ESOPHAGOGASTRODUODENOSCOPY N/A 01/08/2021   Procedure: ESOPHAGOGASTRODUODENOSCOPY (EGD);  Surgeon: Wilford Corner, MD;  Location: [facility];  Service: Endoscopy;  Laterality: N/A;   ESOPHAGOGASTRODUODENOSCOPY N/A 05/02/2021   Procedure: ESOPHAGOGASTRODUODENOSCOPY (EGD);  Surgeon: Arta Silence, MD;  Location: [facility];  Service: Endoscopy;  Laterality: N/A;   ESOPHAGOGASTRODUODENOSCOPY (EGD) WITH PROPOFOL N/A 09/30/2017   Procedure: ESOPHAGOGASTRODUODENOSCOPY (EGD) WITH PROPOFOL;  Surgeon: Wonda Horner, MD;  Location: [facility] ENDOSCOPY;  Service: Endoscopy;  Laterality: N/A;   ESOPHAGOGASTRODUODENOSCOPY (EGD) WITH PROPOFOL N/A 12/20/2017   Procedure: ESOPHAGOGASTRODUODENOSCOPY (EGD) WITH PROPOFOL;  Surgeon: Clarene Essex, MD;  Location: [place];  Service: Endoscopy;  Laterality: N/A;   ESOPHAGOGASTRODUODENOSCOPY (EGD) WITH PROPOFOL N/A 08/10/2020   Procedure: ESOPHAGOGASTRODUODENOSCOPY (EGD) WITH PROPOFOL;  Surgeon: Wilford Corner, MD;  Location: [place];  Service: Endoscopy;  Laterality: N/A;   ESOPHAGOGASTRODUODENOSCOPY (EGD) WITH PROPOFOL N/A 11/20/2020   Procedure: ESOPHAGOGASTRODUODENOSCOPY (EGD) WITH PROPOFOL;  Surgeon: Ronald Lobo, MD;  [facility]: [facility];  Service: Endoscopy;  Laterality: N/A;   ESOPHAGOGASTRODUODENOSCOPY (EGD) WITH PROPOFOL N/A 02/28/2021   Procedure: ESOPHAGOGASTRODUODENOSCOPY (EGD) WITH PROPOFOL;  Surgeon: Clarene Essex, MD;  Location: [facility];  Service: Endoscopy;  Laterality: N/A;   GIVENS CAPSULE STUDY N/A 11/20/2020   Procedure: GIVENS CAPSULE STUDY;  Surgeon: Ronald Lobo, MD;  Location: [facility];  Service: Endoscopy;  Laterality:  N/A;   HEMOSTASIS CLIP PLACEMENT  05/02/2021   Procedure: HEMOSTASIS CLIP PLACEMENT;  Surgeon: Arta Silence, MD;  Location: [facility];  Service: Endoscopy;;   HERNIA REPAIR     INTRAOPERATIVE TRANSESOPHAGEAL ECHOCARDIOGRAM N/A 03/31/2014   Procedure: INTRAOPERATIVE TRANSESOPHAGEAL ECHOCARDIOGRAM;  Surgeon: Melrose Nakayama, MD;  Location: [place];  Service: Open Heart Surgery;  Laterality: N/A;   IR ANGIOGRAM SELECTIVE EACH ADDITIONAL VESSEL  06/16/2020   IR ANGIOGRAM SELECTIVE EACH ADDITIONAL VESSEL  06/16/2020   IR ANGIOGRAM SELECTIVE EACH ADDITIONAL VESSEL  06/16/2020   IR ANGIOGRAM SELECTIVE EACH ADDITIONAL VESSEL  06/16/2020   IR ANGIOGRAM SELECTIVE EACH ADDITIONAL VESSEL  06/16/2020   IR ANGIOGRAM SELECTIVE EACH ADDITIONAL VESSEL  06/16/2020   IR ANGIOGRAM SELECTIVE EACH ADDITIONAL VESSEL  06/16/2020   IR ANGIOGRAM SELECTIVE EACH ADDITIONAL VESSEL  06/16/2020   IR ANGIOGRAM SELECTIVE EACH ADDITIONAL VESSEL  06/16/2020   IR ANGIOGRAM SELECTIVE EACH ADDITIONAL VESSEL  06/30/2020   IR ANGIOGRAM SELECTIVE EACH ADDITIONAL VESSEL  06/30/2020   IR ANGIOGRAM VISCERAL SELECTIVE  06/16/2020   IR ANGIOGRAM VISCERAL SELECTIVE  06/30/2020   IR EMBO ARTERIAL NOT HEMORR HEMANG INC GUIDE ROADMAPPING  06/16/2020   IR EMBO TUMOR ORGAN ISCHEMIA INFARCT INC GUIDE ROADMAPPING  06/30/2020   IR EMBO VENOUS NOT HEMORR HEMANG  INC  GUIDE ROADMAPPING  01/13/2021   IR IVUS EACH ADDITIONAL NON CORONARY VESSEL  01/13/2021   IR RADIOLOGIST EVAL & MGMT  05/06/2020   IR RADIOLOGIST EVAL & MGMT  05/20/2020   IR RADIOLOGIST EVAL & MGMT  05/25/2021   IR TIPS  01/13/2021   IR US GUIDE VASC ACCESS RIGHT  06/16/2020   IR US GUIDE VASC ACCESS RIGHT  06/30/2020   IR US GUIDE VASC ACCESS RIGHT  01/13/2021   LAPAROSCOPIC UMBILICAL HERNIA REPAIR W/ MESH  06-06-2011   LEFT HEART CATHETERIZATION WITH CORONARY ANGIOGRAM N/A 03/28/2014   Procedure: LEFT HEART CATHETERIZATION WITH CORONARY ANGIOGRAM;  Surgeon: Sinclair Grooms, MD;   Location: [facility] CATH LAB;  Service: Cardiovascular;  Laterality: N/A;   LIPOMA EXCISION Left 08/25/2017   Procedure: EXCISION LIPOMA LEFT POSTERIOR THIGH;  Surgeon: Clovis Riley, MD;  Location: [place];  Service: General;  Laterality: Left;   NEPHROLITHOTOMY  1990'S   OPEN APPENDECTOMY W/ PARTIAL CECECTOMY  05-16-2004   PERCUTANEOUS CORONARY STENT INTERVENTION (PCI-S) N/A 06/28/2012   Procedure: PERCUTANEOUS CORONARY STENT INTERVENTION (PCI-S);  Surgeon: Sherren Mocha, MD;  Location: [facility] CATH LAB;  Service: Cardiovascular;  Laterality: N/A;   POLYPECTOMY  11/20/2020   Procedure: POLYPECTOMY;  Surgeon: Ronald Lobo, MD;  Location: [facility];  Service: Endoscopy;;   POLYPECTOMY  05/02/2021   Procedure: POLYPECTOMY;  Surgeon: Arta Silence, MD;  Location: [facility];  Service: Endoscopy;;   RADIOLOGY WITH ANESTHESIA N/A 01/13/2021   Procedure: IR WITH ANESTHESIA - TIPS;  Surgeon: Suzette Battiest, MD;  Location: [place];  Service: Radiology;  Laterality: N/A;    Family History  Problem Relation Age of Onset   Lung cancer Sister    Cancer Sister        lung   Cancer Mother    Cancer Father        died in his 44s.   Cancer Brother        lung   Coronary artery disease Other    Diabetes Other    Colon cancer Other    Cancer Sister        lung    Social History   Tobacco Use   Smoking status: Former    Packs/day: 2.00    Years: 40.00    Pack years: 80.00    Types: Cigarettes    Quit date: 08/29/1996    Years since quitting: 24.9   Smokeless tobacco: Never  Vaping Use   Vaping Use: Never used  Substance Use Topics   Alcohol use: Not Currently    Alcohol/week: 8.0 standard drinks    Types: 8 Cans of beer per week    Comment: 2 beers every other day   Drug use: No     Home Medications Prior to Admission medications   Medication Sig Start Date End Date Taking? Authorizing Provider  albuterol (VENTOLIN HFA) 108 (90 Base) MCG/ACT inhaler Inhale 2 puffs into the lungs  every 6 (six) hours as needed for wheezing or shortness of breath. 03/01/21  Yes Aline August, MD  ferrous sulfate 325 (65 FE) MG tablet Take 1 tablet (325 mg total) by mouth daily with breakfast. 05/06/21 08/04/21 Yes Dahal, Marlowe Aschoff, MD  furosemide (LASIX) 40 MG tablet Take 1 tablet (40 mg total) by mouth daily. 05/05/21 08/03/21 Yes Dahal, Marlowe Aschoff, MD  lactulose (CHRONULAC) 10 GM/15ML solution Take 45 mLs (30 g total) by mouth 3 (three) times daily. 03/31/21  Yes Debbe Odea, MD  pantoprazole (PROTONIX) 40 MG  tablet Take 1 tablet (40 mg total) by mouth 2 (two) times daily before a meal. 06/18/21  Yes Ghimire, Henreitta Leber, MD  rifaximin (XIFAXAN) 550 MG TABS tablet Take 1 tablet (550 mg total) by mouth 2 (two) times daily. 06/18/21  Yes Ghimire, Henreitta Leber, MD  Accu-Chek Softclix Lancets lancets USE AS DIRECTED TO TEST BLOOD SUGAR FOUR TIMES DAILY 03/13/20   [name], MD  [facility] ELLIPTA 62.5-25 MCG/ACT AEPB Inhale 1 puff into the lungs daily.    [provider]  blood glucose meter kit and supplies Dispense based on patient and insurance preference. Use up to four times daily as directed. (FOR ICD-10 E10.9, E11.9). 03/12/20   [name], MD  spironolactone (ALDACTONE) 50 MG tablet Take 1 tablet (50 mg total) by mouth daily. Patient not taking: Reported on 07/23/2021 03/07/21   Jonetta Osgood, MD     Allergies    Fluzone quadrivalent [influenza vac split quad]   Review of Systems   Review of Systems Unable to assess due to mental status.    Physical Exam BP (!) 121/54   Pulse (!) 58   Temp 98.2 F (36.8 C) (Oral)   Resp 19   Ht _0  (1.702 m)   Wt 93 kg   SpO2 99%   BMI 32.11 kg/m   Physical Exam Vitals and nursing note reviewed.  Constitutional:      Appearance: Normal appearance.     Comments: somnolent  HENT:     Head: Normocephalic and atraumatic.     Nose: Nose normal.     Mouth/Throat:     Mouth: Mucous membranes are moist.  Eyes:      Extraocular Movements: Extraocular movements intact.     Conjunctiva/sclera: Conjunctivae normal.  Cardiovascular:     Rate and Rhythm: Normal rate.  Pulmonary:     Effort: Pulmonary effort is normal.     Breath sounds: Normal breath sounds.  Abdominal:     Palpations: Abdomen is soft.     Tenderness: There is no abdominal tenderness.     Comments: protuberent  Musculoskeletal:        General: No swelling. Normal range of motion.     Cervical back: Neck supple.     Right lower leg: Edema present.     Left lower leg: Edema present.  Skin:    General: Skin is warm and dry.  Neurological:     Mental Status: He is disoriented.  Psychiatric:        Mood and Affect: Mood normal.     ED Results / Procedures / Treatments   Labs (all labs ordered are listed, but only abnormal results are displayed) Labs Reviewed  CBC WITH DIFFERENTIAL/PLATELET - Abnormal; Notable for the following components:      Result Value   WBC 1.7 (*)    RBC 2.84 (*)    Hemoglobin 7.8 (*)    HCT 26.1 (*)    MCHC 29.9 (*)    RDW 18.8 (*)    Platelets 96 (*)    Neutro Abs 1.0 (*)    Lymphs Abs 0.5 (*)    All other components within normal limits  COMPREHENSIVE METABOLIC PANEL - Abnormal; Notable for the following components:   Potassium 3.0 (*)    Glucose, Bld 105 (*)    Calcium 8.1 (*)    Total Protein 5.1 (*)    Albumin 2.2 (*)    Alkaline Phosphatase 157 (*)    All other  components within normal limits  AMMONIA - Abnormal; Notable for the following components:   Ammonia 135 (*)    All other components within normal limits  PROTIME-INR - Abnormal; Notable for the following components:   Prothrombin Time 15.8 (*)    INR 1.3 (*)    All other components within normal limits  CBG MONITORING, ED - Abnormal; Notable for the following components:   Glucose-Capillary 105 (*)    All other components within normal limits  I-STAT ARTERIAL BLOOD GAS, ED - Abnormal; Notable for the following components:    pH, Arterial 7.486 (*)    pO2, Arterial 78 (*)    Potassium 3.1 (*)    HCT 25.0 (*)    Hemoglobin 8.5 (*)    All other components within normal limits  RESP PANEL BY RT-PCR (FLU A&B, COVID) [id number]  URINALYSIS, ROUTINE W REFLEX MICROSCOPIC  MAGNESIUM    EKG None  Radiology CT Head Wo Contrast  Result Date: 07/23/2021 CLINICAL DATA:  Mental status change. EXAM: CT HEAD WITHOUT CONTRAST TECHNIQUE: Contiguous axial images were obtained from the base of the skull through the vertex without intravenous contrast. COMPARISON:  Head CT 05/28/2021. FINDINGS: Brain: No evidence of acute infarction, hemorrhage, hydrocephalus, extra-axial collection or mass lesion/mass effect. There is stable mild patchy periventricular and deep white matter hypodensity, likely chronic small vessel ischemic change. This is similar to the prior study. Vascular: Atherosclerotic calcifications are present within the cavernous internal carotid arteries. Skull: Normal. Negative for fracture or focal lesion. Sinuses/Orbits: No acute finding. Other: None. IMPRESSION: No acute intracranial abnormality. Electronically Signed   By: Ronney Asters M.D.   On: 07/23/2021 18:41    Procedures .Critical Care Performed by: Truddie Hidden, MD Authorized by: Truddie Hidden, MD   Critical care provider statement:    Critical care time (minutes):  45   Critical care time was exclusive of:  Separately billable procedures and treating other patients   Critical care was necessary to treat or prevent imminent or life-threatening deterioration of the following conditions:  Hepatic failure and CNS failure or compromise   Critical care was time spent personally by me on the following activities:  Development of treatment plan with patient or surrogate, discussions with consultants, evaluation of patient's response to treatment, examination of patient, ordering and review of laboratory studies, ordering and review of radiographic studies,  ordering and performing treatments and interventions, pulse oximetry, re-evaluation of patient's condition and review of old charts   Care discussed with: admitting provider    Medications Ordered in the ED Medications  potassium chloride SA (KLOR-CON) CR tablet 40 mEq (has no administration in time range)  lactulose (CHRONULAC) 10 GM/15ML solution 30 g (has no administration in time range)     MDM Rules/Calculators/A&P MDM Patient with presentation similar to previous hepatic encephalopathy, he is unable to provide any useful history and no family is available. Will check labs, including ammonia, INR and send for CT. He is hemodynamically stable.   ED Course  I have reviewed the triage vital signs and the nursing notes.  Pertinent labs & imaging results that were available during my care of the patient were reviewed by me and considered in my medical decision making (see chart for details).  Clinical Course as of 07/23/21 1920  Fri Jul 23, 2021  1730 ABG unremarkable.  [CS]  8676 CMP with mild hypokalemia. Will replete orally.  [CS]  7209 CBC with pancytopenia similar to previous but WBC is lower.  [  CS]  1821 Ammonia is elevated, higher than previous. Will begin oral Lactulose and plan admission for further management.  [CS]  9574 INR is mildly elevated.  [CS]  1916 Spoke with Dr. Posey Pronto, Hospitalist, who will evaluate for admission.  [CS]    Clinical Course User Index [CS] Truddie Hidden, MD    Final Clinical Impression(s) / ED Diagnoses Final diagnoses:  Hepatic encephalopathy  Hypokalemia    Rx / DC Orders ED Discharge Orders     None        Truddie Hidden, MD 07/23/21 1920

## 2021-07-24 DIAGNOSIS — J4520 Mild intermittent asthma, uncomplicated: Secondary | ICD-10-CM | POA: Diagnosis not present

## 2021-07-24 DIAGNOSIS — E876 Hypokalemia: Secondary | ICD-10-CM | POA: Diagnosis not present

## 2021-07-24 DIAGNOSIS — K7030 Alcoholic cirrhosis of liver without ascites: Secondary | ICD-10-CM

## 2021-07-24 DIAGNOSIS — K7682 Hepatic encephalopathy: Secondary | ICD-10-CM | POA: Diagnosis not present

## 2021-07-24 DIAGNOSIS — R69 Illness, unspecified: Secondary | ICD-10-CM | POA: Diagnosis not present

## 2021-07-24 DIAGNOSIS — R5381 Other malaise: Secondary | ICD-10-CM

## 2021-07-24 DIAGNOSIS — I5032 Chronic diastolic (congestive) heart failure: Secondary | ICD-10-CM | POA: Diagnosis not present

## 2021-07-24 DIAGNOSIS — I2510 Atherosclerotic heart disease of native coronary artery without angina pectoris: Secondary | ICD-10-CM | POA: Diagnosis not present

## 2021-07-24 DIAGNOSIS — C220 Liver cell carcinoma: Secondary | ICD-10-CM | POA: Diagnosis not present

## 2021-07-24 LAB — URINALYSIS, ROUTINE W REFLEX MICROSCOPIC
Bilirubin Urine: NEGATIVE
Glucose, UA: NEGATIVE mg/dL
Ketones, ur: NEGATIVE mg/dL
Nitrite: NEGATIVE
Protein, ur: NEGATIVE mg/dL
Specific Gravity, Urine: 1.017 (ref 1.005–1.030)
WBC, UA: >50 WBC/hpf — ABNORMAL HIGH (ref 0–5)
pH: 5 (ref 5.0–8.0)

## 2021-07-24 LAB — COMPREHENSIVE METABOLIC PANEL
ALT: 21 U/L (ref 0–44)
AST: 34 U/L (ref 15–41)
Albumin: 2 g/dL — ABNORMAL LOW (ref 3.5–5.0)
Alkaline Phosphatase: 152 U/L — ABNORMAL HIGH (ref 38–126)
Anion gap: 7 (ref 5–15)
BUN: 12 mg/dL (ref 8–23)
CO2: 22 mmol/L (ref 22–32)
Calcium: 8.2 mg/dL — ABNORMAL LOW (ref 8.9–10.3)
Chloride: 111 mmol/L (ref 98–111)
Creatinine, Ser: 1.01 mg/dL (ref 0.61–1.24)
GFR, Estimated: >60 mL/min (ref >60)
Glucose, Bld: 111 mg/dL — ABNORMAL HIGH (ref 70–99)
Potassium: 2.9 mmol/L — ABNORMAL LOW (ref 3.5–5.1)
Sodium: 140 mmol/L (ref 135–145)
Total Bilirubin: 1 mg/dL (ref 0.3–1.2)
Total Protein: 4.8 g/dL — ABNORMAL LOW (ref 6.5–8.1)

## 2021-07-24 LAB — CBC
HCT: 26 % — ABNORMAL LOW (ref 39.0–52.0)
Hemoglobin: 7.9 g/dL — ABNORMAL LOW (ref 13.0–17.0)
MCH: 27.7 pg (ref 26.0–34.0)
MCHC: 30.4 g/dL (ref 30.0–36.0)
MCV: 91.2 fL (ref 80.0–100.0)
Platelets: 101 10*3/uL — ABNORMAL LOW (ref 150–400)
RBC: 2.85 MIL/uL — ABNORMAL LOW (ref 4.22–5.81)
RDW: 19 % — ABNORMAL HIGH (ref 11.5–15.5)
WBC: 3.3 10*3/uL — ABNORMAL LOW (ref 4.0–10.5)
nRBC: 0 % (ref 0.0–0.2)

## 2021-07-24 LAB — PROTIME-INR
INR: 1.3 — ABNORMAL HIGH (ref 0.8–1.2)
Prothrombin Time: 16.1 seconds — ABNORMAL HIGH (ref 11.4–15.2)

## 2021-07-24 LAB — MAGNESIUM: Magnesium: 1.9 mg/dL (ref 1.7–2.4)

## 2021-07-24 MED ORDER — ALBUTEROL SULFATE (2.5 MG/3ML) 0.083% IN NEBU: 3.0000 mL | INHALATION_SOLUTION | Freq: Four times a day (QID) | RESPIRATORY_TRACT | Status: DC | PRN

## 2021-07-24 MED ORDER — FUROSEMIDE 40 MG PO TABS
40.0000 mg | ORAL_TABLET | Freq: Every day | ORAL | Status: DC
  Administered 2021-07-25: 08:00:00 40 mg via ORAL
  Filled 2021-07-24: qty 1

## 2021-07-24 MED ORDER — LACTULOSE 10 GM/15ML PO SOLN
30.0000 g | Freq: Three times a day (TID) | ORAL | Status: DC
  Administered 2021-07-24 – 2021-07-25 (×3): 30 g via ORAL
  Filled 2021-07-24 (×3): qty 60

## 2021-07-24 MED ORDER — FERROUS SULFATE 325 (65 FE) MG PO TABS
325.0000 mg | ORAL_TABLET | Freq: Every day | ORAL | Status: DC
  Administered 2021-07-25: 08:00:00 325 mg via ORAL
  Filled 2021-07-24: qty 1

## 2021-07-24 MED ORDER — RIFAXIMIN 550 MG PO TABS
550.0000 mg | ORAL_TABLET | Freq: Two times a day (BID) | ORAL | Status: DC
  Administered 2021-07-24 – 2021-07-25 (×3): 550 mg via ORAL
  Filled 2021-07-24 (×3): qty 1

## 2021-07-24 MED ORDER — POTASSIUM CHLORIDE 10 MEQ/100ML IV SOLN
10.0000 meq | INTRAVENOUS | Status: AC
  Administered 2021-07-24 (×4): 10 meq via INTRAVENOUS
  Filled 2021-07-24 (×4): qty 100

## 2021-07-24 MED ORDER — PANTOPRAZOLE SODIUM 40 MG PO TBEC
40.0000 mg | DELAYED_RELEASE_TABLET | Freq: Two times a day (BID) | ORAL | Status: DC
  Administered 2021-07-24 – 2021-07-25 (×2): 40 mg via ORAL
  Filled 2021-07-24 (×2): qty 1

## 2021-07-24 MED ORDER — POTASSIUM CHLORIDE 10 MEQ/100ML IV SOLN
10.0000 meq | INTRAVENOUS | Status: AC
  Administered 2021-07-24 (×2): 10 meq via INTRAVENOUS
  Filled 2021-07-24 (×2): qty 100

## 2021-07-24 MED ORDER — SPIRONOLACTONE 25 MG PO TABS
50.0000 mg | ORAL_TABLET | Freq: Every day | ORAL | Status: DC
  Administered 2021-07-24 – 2021-07-25 (×2): 50 mg via ORAL
  Filled 2021-07-24 (×2): qty 2

## 2021-07-24 NOTE — Progress Notes (Signed)
Patient arrived to room 3w08 from ED.  Assessment complete, VS obtained, and Admission database began.

## 2021-07-24 NOTE — Evaluation (Signed)
Physical Therapy Evaluation Patient Details Name: [name] MRN: [id number] DOB: July 20, 1946 Today's Date: 07/24/2021  History of Present Illness  75yo male who presented on 11/25 with increased confusion. Admitted with acute hepatic encephalopathy in setting of liver cirrhosis. PMH OA, liver CA, LBP, CAD, heart failure, HLD, DM, HTN, hx ankle fracture  Clinical Impression   Patient received in bed, pleasantly confused and cooperative. Note B feet/ankles are both very swollen and edematous. Able to get to EOB and take sidesteps with RW with min guard/assist for line management; deferred gait training today due to rectal tube still being in place. He was a bit impulsive but was able to be redirected. Offered up to chair, however he refused and insisted on sitting at EOB to finish his lunch instead. Left sitting EOB with NT present and attending, bed alarm active. If family can provide 24/7 assist, he may be able to return home with HHPT- if not, will require SNF.       Recommendations for follow up therapy are one component of a multi-disciplinary discharge planning process, led by the attending physician.  Recommendations may be updated based on patient status, additional functional criteria and insurance authorization.  Follow Up Recommendations Home health PT (will need SNF if family cannot provide 24/7A)    Assistance Recommended at Discharge Frequent or constant Supervision/Assistance  Functional Status Assessment Patient has had a recent decline in their functional status and demonstrates the ability to make significant improvements in function in a reasonable and predictable amount of time.  Equipment Recommendations  Rolling walker (2 wheels);BSC/3in1    Recommendations for Other Services       Precautions / Restrictions Precautions Precautions: Fall;Other (comment) Precaution Comments: rectal tube Restrictions Weight Bearing Restrictions: No      Mobility  Bed  Mobility Overal bed mobility: Needs Assistance Bed Mobility: Supine to Sit;Sit to Supine     Supine to sit: Min guard Sit to supine: Min guard   General bed mobility comments: min guard for safety/line management, HOB elevated and increased time/effort    Transfers Overall transfer level: Needs assistance Equipment used: Rolling walker (2 wheels) Transfers: Sit to/from Stand Sit to Stand: Min guard           General transfer comment: for safety/line management, cues for hand placement    Ambulation/Gait               General Gait Details: deferred due to rectal tube- but was able to take side steps alongside EOB with min guard/RW  Stairs            Wheelchair Mobility    Modified Rankin (Stroke Patients Only)       Balance Overall balance assessment: Needs assistance Sitting-balance support: Feet supported;Bilateral upper extremity supported Sitting balance-Leahy Scale: Good Sitting balance - Comments: statically   Standing balance support: Bilateral upper extremity supported;During functional activity;Reliant on assistive device for balance Standing balance-Leahy Scale: Fair                               Pertinent Vitals/Pain Pain Assessment: Faces Faces Pain Scale: No hurt Pain Intervention(s): Limited activity within patient's tolerance;Monitored during session    [place] expects to be discharged to:: Private residence Living Arrangements: Spouse/significant other Available Help at Discharge: Family Type of Home: House Home Access: Stairs to enter Entrance Stairs-Rails: Left Entrance Stairs-Number of Steps: 1   Home Layout: Two level;Able to  live on main level with bedroom/bathroom Home Equipment: Rolling Walker (2 wheels);Shower seat;BSC/3in1;Cane - single point Additional Comments: pt lives with wife, son, dtr-in-law, and 33 yo grand-dtr; very poor historian, so all information taken from prior charting     Prior Function                       Hand Dominance   Dominant Hand: Right    Extremity/Trunk Assessment   Upper Extremity Assessment Upper Extremity Assessment: Defer to OT evaluation    Lower Extremity Assessment Lower Extremity Assessment: Generalized weakness    Cervical / Trunk Assessment Cervical / Trunk Assessment: Kyphotic  Communication   Communication: No difficulties  Cognition Arousal/Alertness: Awake/alert Behavior During Therapy: Flat affect;Impulsive Overall Cognitive Status: No family/caregiver present to determine baseline cognitive functioning                                 General Comments: oriented to self, location, and generally the time (tells me its thanksgiving month but unable to name correct month, did get year correct); able to use clock on wall to tell me what time it was. Not sure why he is here, states "bleeding"        General Comments General comments (skin integrity, edema, etc.): VSS on RA    Exercises     Assessment/Plan    PT Assessment Patient needs continued PT services  PT Problem List Decreased strength;Decreased cognition;Decreased knowledge of use of DME;Decreased activity tolerance;Decreased safety awareness;Decreased balance;Decreased mobility       PT Treatment Interventions DME instruction;Balance training;Gait training;Stair training;Cognitive remediation;Functional mobility training;Patient/family education;Therapeutic activities;Therapeutic exercise    PT Goals (Current goals can be found in the Care Plan section)  Acute Rehab PT Goals Patient Stated Goal: sit up to eat, go home tomorrow PT Goal Formulation: With patient Time For Goal Achievement: 08/07/21 Potential to Achieve Goals: Fair    Frequency Min 3X/week   Barriers to discharge        Co-evaluation               AM-PAC PT "6 Clicks" Mobility  Outcome Measure Help needed turning from your back to your side while in a  flat bed without using bedrails?: A Little Help needed moving from lying on your back to sitting on the side of a flat bed without using bedrails?: A Little Help needed moving to and from a bed to a chair (including a wheelchair)?: A Little Help needed standing up from a chair using your arms (e.g., wheelchair or bedside chair)?: A Little Help needed to walk in hospital room?: A Little Help needed climbing 3-5 steps with a railing? : A Lot 6 Click Score: 17    End of Session Equipment Utilized During Treatment: Gait belt Activity Tolerance: Patient tolerated treatment well Patient left: in bed;with call bell/phone within reach;with bed alarm set;Other (comment) (NT present and attending) Nurse Communication: Mobility status PT Visit Diagnosis: Unsteadiness on feet (R26.81);Muscle weakness (generalized) (M62.81);Difficulty in walking, not elsewhere classified (R26.2);History of falling (Z91.81)    Time: 5784-6962 PT Time Calculation (min) (ACUTE ONLY): 30 min   Charges:   PT Evaluation $PT Eval Moderate Complexity: 1 Mod PT Treatments $Therapeutic Activity: 8-22 mins       Windell [place], DPT, PN2   Supplemental Physical Therapist [place]    Pager [phone] Acute Rehab Office [phone]

## 2021-07-24 NOTE — Progress Notes (Signed)
PROGRESS NOTE  [name] DPO:[id number] DOB: Jul 14, 1946   PCP: [name], MD  Patient is from: Home.  Lives with his wife.  DOA: 07/23/2021 LOS: 0  Chief complaints:  Chief Complaint  Patient presents with   Altered Mental Status     Brief Narrative / Interim history: 75 year old M with PMH of liver cirrhosis with ascites s/p TIPs, HCC, recurrent hepatic encephalopathy, GIB s/p embolization of gastric varices in 11/3152, diastolic CHF, CAD, pancytopenia and recent hospitalization from 10/18-2010/21 for hepatic encephalopathy returning with altered mental status and admitted for hepatic encephalopathy again.  Ammonia elevated to 135.  CTH and ABG without significant finding.  Patient was started on rectal lactulose.  Subjective: Seen and examined earlier this morning.  No major events overnight of this morning.  He is awake but not quite alert.  He is oriented x4 but no great insight into why he is in the hospital.  He admits not taking his medications at times.  He denies abdominal pain, headache, chest pain, dyspnea, fever, chills or UTI symptoms.  Objective: Vitals:   07/24/21 0341 07/24/21 0500 07/24/21 0744 07/24/21 0900  BP: (!) 124/56   (!) 114/45  Pulse: 81   79  Resp: 20   12  Temp: 98.3 F (36.8 C)   99 F (37.2 C)  TempSrc: Oral   Oral  SpO2: 99%  98% 100%  Weight:  98.6 kg    Height:        Intake/Output Summary (Last 24 hours) at 07/24/2021 1154 Last data filed at 07/24/2021 1100 Gross per 24 hour  Intake 1395.41 ml  Output 1650 ml  Net -254.59 ml   Filed Weights   07/23/21 1623 07/24/21 0500  Weight: 93 kg 98.6 kg    Examination:  GENERAL: No apparent distress.  Nontoxic. HEENT: MMM.  Vision and hearing grossly intact.  NECK: Supple.  No apparent JVD.  RESP: 100% on RA.  No IWOB.  Fair aeration bilaterally. CVS:  RRR. Heart sounds normal.  ABD/GI/GU: BS+. Abd soft, NTND.  MSK/EXT:  Moves extremities.  1+ BLE edema but difficult exam  due to lymphedema SKIN: no apparent skin lesion or wound NEURO: Awake but not quite alert.  Oriented x4.  Limited insight.  Follows command.  No apparent focal neuro deficit. PSYCH: Calm. Normal affect.   Procedures:  None  Microbiology summarized: [id number] and influenza PCR nonreactive.  Assessment & Plan: Recurrent hepatic encephalopathy-ammonia elevated to 135.  ABG and CTH negative.  Improved.  History of liver cirrhosis s/p TIPS-low suspicion for SBP.  History of hepatocellular carcinoma: -Switch to lactulose 30 g 3 times daily and add back rifaximin -Resume home p.o. Lasix in the morning -Resume home Aldactone -Sodium and fluid restriction   Hypokalemia: K2.9. -Finishing IV KCl 10 mill equivalent x6 -Resume and Aldactone as above. -Recheck of replenish as appropriate   Pancytopenia: Likely due to liver cirrhosis.  Relatively stable. -Continue monitoring   Chronic diastolic CHF: TTE in 04/5092 with LVEF of 60 to 65%, G3-DD.  No cardiopulmonary symptoms but has some edema with lymphedema. -Diuretics as above -Monitor fluid status and respiratory status. -Sodium and fluid restriction   Asthma: Stable. -Continue Anoro Ellipta and as needed albuterol   CAD: Stable.  No anginal symptoms. -Not on antiplatelet due to history of GI bleed.  Physical deconditioning: -PT/OT eval.    Body mass index is 34.05 kg/m.         DVT prophylaxis:  SCDs Start: 07/23/21 1958  Code Status: Full code Family Communication: Patient and/or RN. Available if any question.  Level of care: Med-Surg Status is: Observation  The patient will require care spanning > 2 midnights and should be moved to inpatient because: Hepatic encephalopathy and hypokalemia      Consultants:  None   Sch Meds:  Scheduled Meds:  [START ON 07/25/2021] ferrous sulfate  325 mg Oral Q breakfast   furosemide  40 mg Oral Daily   lactulose  30 g Oral TID   pantoprazole  40 mg Oral BID AC   rifaximin   550 mg Oral BID   spironolactone  50 mg Oral Daily   umeclidinium-vilanterol  1 puff Inhalation Daily   Continuous Infusions:  potassium chloride 10 mEq (07/24/21 1120)   PRN Meds:.albuterol, ondansetron **OR** ondansetron (ZOFRAN) IV  Antimicrobials: Anti-infectives (From admission, onward)    Start     Dose/Rate Route Frequency Ordered Stop   07/24/21 1145  rifaximin (XIFAXAN) tablet 550 mg        550 mg Oral 2 times daily 07/24/21 1049          I have personally reviewed the following labs and images: CBC: Recent Labs  Lab 07/19/21 1204 07/23/21 1626 07/23/21 1723 07/24/21 0216  WBC 2.4* 1.7*  --  3.3*  NEUTROABS 1.6* 1.0*  --   --   HGB 7.3* 7.8* 8.5* 7.9*  HCT 23.3* 26.1* 25.0* 26.0*  MCV 86.6 91.9  --  91.2  PLT 90* 96*  --  101*   BMP &GFR Recent Labs  Lab 07/23/21 1626 07/23/21 1723 07/24/21 0216  NA 139 141 140  K 3.0* 3.1* 2.9*  CL 108  --  111  CO2 23  --  22  GLUCOSE 105*  --  111*  BUN 12  --  12  CREATININE 1.12  --  1.01  CALCIUM 8.1*  --  8.2*  MG 1.9  --   --    Estimated Creatinine Clearance: 70.7 mL/min (by C-G formula based on SCr of 1.01 mg/dL). Liver & Pancreas: Recent Labs  Lab 07/23/21 1626 07/24/21 0216  AST 36 34  ALT 22 21  ALKPHOS 157* 152*  BILITOT 1.1 1.0  PROT 5.1* 4.8*  ALBUMIN 2.2* 2.0*   No results for input(s): LIPASE, AMYLASE in the last 168 hours. Recent Labs  Lab 07/23/21 1627  AMMONIA 135*   Diabetic: No results for input(s): HGBA1C in the last 72 hours. Recent Labs  Lab 07/23/21 1631  GLUCAP 105*   Cardiac Enzymes: No results for input(s): CKTOTAL, CKMB, CKMBINDEX, TROPONINI in the last 168 hours. No results for input(s): PROBNP in the last 8760 hours. Coagulation Profile: Recent Labs  Lab 07/23/21 1649 07/24/21 0216  INR 1.3* 1.3*   Thyroid Function Tests: No results for input(s): TSH, T4TOTAL, FREET4, T3FREE, THYROIDAB in the last 72 hours. Lipid Profile: No results for input(s): CHOL,  HDL, LDLCALC, TRIG, CHOLHDL, LDLDIRECT in the last 72 hours. Anemia Panel: No results for input(s): VITAMINB12, FOLATE, FERRITIN, TIBC, IRON, RETICCTPCT in the last 72 hours. Urine analysis:    Component Value Date/Time   COLORURINE YELLOW 07/24/2021 0407   APPEARANCEUR HAZY (A) 07/24/2021 0407   LABSPEC 1.017 07/24/2021 0407   PHURINE 5.0 07/24/2021 0407   GLUCOSEU NEGATIVE 07/24/2021 0407   HGBUR MODERATE (A) 07/24/2021 0407   BILIRUBINUR NEGATIVE 07/24/2021 0407   KETONESUR NEGATIVE 07/24/2021 0407   PROTEINUR NEGATIVE 07/24/2021 0407   UROBILINOGEN 0.2 03/29/2014 1546   NITRITE NEGATIVE 07/24/2021  [place] (A) 07/24/2021 0407   Sepsis Labs: Invalid input(s): PROCALCITONIN, [place]  Microbiology: Recent Results (from the past 240 hour(s))  Resp Panel by RT-PCR (Flu A&B, Covid) Nasopharyngeal Swab     Status: None   Collection Time: 07/23/21  4:40 PM   Specimen: Nasopharyngeal Swab; Nasopharyngeal(NP) swabs in vial transport medium  Result Value Ref Range Status   SARS Coronavirus 2 by RT PCR NEGATIVE NEGATIVE Final    Comment: (NOTE) SARS-CoV-2 target nucleic acids are NOT DETECTED.  The SARS-CoV-2 RNA is generally detectable in upper respiratory specimens during the acute phase of infection. The lowest concentration of SARS-CoV-2 viral copies this assay can detect is 138 copies/mL. A negative result does not preclude SARS-Cov-2 infection and should not be used as the sole basis for treatment or other patient management decisions. A negative result may occur with  improper specimen collection/handling, submission of specimen other than nasopharyngeal swab, presence of viral mutation(s) within the areas targeted by this assay, and inadequate number of viral copies(<138 copies/mL). A negative result must be combined with clinical observations, patient history, and epidemiological information. The expected result is Negative.  Fact Sheet for  Patients:  EntrepreneurPulse.com.au  Fact Sheet for Healthcare Providers:  IncredibleEmployment.be  This test is no t yet approved or cleared by the [place] FDA and  has been authorized for detection and/or diagnosis of SARS-CoV-2 by FDA under an Emergency Use Authorization (EUA). This EUA will remain  in effect (meaning this test can be used) for the duration of the COVID-19 declaration under Section 564(b)(1) of the Act, 21 U.S.C.section 360bbb-3(b)(1), unless the authorization is terminated  or revoked sooner.       Influenza A by PCR NEGATIVE NEGATIVE Final   Influenza B by PCR NEGATIVE NEGATIVE Final    Comment: (NOTE) The Xpert Xpress SARS-CoV-2/FLU/RSV plus assay is intended as an aid in the diagnosis of influenza from Nasopharyngeal swab specimens and should not be used as a sole basis for treatment. Nasal washings and aspirates are unacceptable for Xpert Xpress SARS-CoV-2/FLU/RSV testing.  Fact Sheet for Patients: EntrepreneurPulse.com.au  Fact Sheet for Healthcare Providers: IncredibleEmployment.be  This test is not yet approved or cleared by the [place] FDA and has been authorized for detection and/or diagnosis of SARS-CoV-2 by FDA under an Emergency Use Authorization (EUA). This EUA will remain in effect (meaning this test can be used) for the duration of the COVID-19 declaration under Section 564(b)(1) of the Act, 21 U.S.C. section 360bbb-3(b)(1), unless the authorization is terminated or revoked.  Performed at [facility], [place] [address] [place], [place] [address]     Radiology Studies: CT Head Wo Contrast  Result Date: 07/23/2021 CLINICAL DATA:  Mental status change. EXAM: CT HEAD WITHOUT CONTRAST TECHNIQUE: Contiguous axial images were obtained from the base of the skull through the vertex without intravenous contrast. COMPARISON:  Head CT 05/28/2021. FINDINGS:  Brain: No evidence of acute infarction, hemorrhage, hydrocephalus, extra-axial collection or mass lesion/mass effect. There is stable mild patchy periventricular and deep white matter hypodensity, likely chronic small vessel ischemic change. This is similar to the prior study. Vascular: Atherosclerotic calcifications are present within the cavernous internal carotid arteries. Skull: Normal. Negative for fracture or focal lesion. Sinuses/Orbits: No acute finding. Other: None. IMPRESSION: No acute intracranial abnormality. Electronically Signed   By: Ronney Asters M.D.   On: 07/23/2021 18:41      Luke Falero T. [place]  If 7PM-7AM, please contact night-coverage [email or web address] 07/24/2021,  11:54 AM

## 2021-07-25 DIAGNOSIS — K7030 Alcoholic cirrhosis of liver without ascites: Secondary | ICD-10-CM | POA: Diagnosis not present

## 2021-07-25 DIAGNOSIS — R5381 Other malaise: Secondary | ICD-10-CM | POA: Diagnosis not present

## 2021-07-25 DIAGNOSIS — J4520 Mild intermittent asthma, uncomplicated: Secondary | ICD-10-CM

## 2021-07-25 DIAGNOSIS — E876 Hypokalemia: Secondary | ICD-10-CM | POA: Diagnosis not present

## 2021-07-25 DIAGNOSIS — I5032 Chronic diastolic (congestive) heart failure: Secondary | ICD-10-CM | POA: Diagnosis not present

## 2021-07-25 DIAGNOSIS — K7682 Hepatic encephalopathy: Secondary | ICD-10-CM | POA: Diagnosis not present

## 2021-07-25 DIAGNOSIS — C220 Liver cell carcinoma: Secondary | ICD-10-CM | POA: Diagnosis not present

## 2021-07-25 DIAGNOSIS — R69 Illness, unspecified: Secondary | ICD-10-CM | POA: Diagnosis not present

## 2021-07-25 DIAGNOSIS — I2510 Atherosclerotic heart disease of native coronary artery without angina pectoris: Secondary | ICD-10-CM | POA: Diagnosis not present

## 2021-07-25 LAB — CBC
HCT: 26 % — ABNORMAL LOW (ref 39.0–52.0)
Hemoglobin: 8.2 g/dL — ABNORMAL LOW (ref 13.0–17.0)
MCH: 28.6 pg (ref 26.0–34.0)
MCHC: 31.5 g/dL (ref 30.0–36.0)
MCV: 90.6 fL (ref 80.0–100.0)
Platelets: 101 10*3/uL — ABNORMAL LOW (ref 150–400)
RBC: 2.87 MIL/uL — ABNORMAL LOW (ref 4.22–5.81)
RDW: 19.8 % — ABNORMAL HIGH (ref 11.5–15.5)
WBC: 3.2 10*3/uL — ABNORMAL LOW (ref 4.0–10.5)
nRBC: 0 % (ref 0.0–0.2)

## 2021-07-25 LAB — RENAL FUNCTION PANEL
Albumin: 2.2 g/dL — ABNORMAL LOW (ref 3.5–5.0)
Anion gap: 8 (ref 5–15)
BUN: 11 mg/dL (ref 8–23)
CO2: 21 mmol/L — ABNORMAL LOW (ref 22–32)
Calcium: 8.2 mg/dL — ABNORMAL LOW (ref 8.9–10.3)
Chloride: 108 mmol/L (ref 98–111)
Creatinine, Ser: 1.03 mg/dL (ref 0.61–1.24)
GFR, Estimated: >60 mL/min (ref >60)
Glucose, Bld: 112 mg/dL — ABNORMAL HIGH (ref 70–99)
Phosphorus: 2.8 mg/dL (ref 2.5–4.6)
Potassium: 3.1 mmol/L — ABNORMAL LOW (ref 3.5–5.1)
Sodium: 137 mmol/L (ref 135–145)

## 2021-07-25 LAB — AMMONIA: Ammonia: 44 umol/L — ABNORMAL HIGH (ref 9–35)

## 2021-07-25 LAB — MAGNESIUM: Magnesium: 1.7 mg/dL (ref 1.7–2.4)

## 2021-07-25 MED ORDER — POTASSIUM CHLORIDE CRYS ER 20 MEQ PO TBCR
40.0000 meq | EXTENDED_RELEASE_TABLET | ORAL | Status: AC
  Administered 2021-07-25 (×2): 40 meq via ORAL
  Filled 2021-07-25 (×2): qty 2

## 2021-07-25 MED ORDER — SPIRONOLACTONE 50 MG PO TABS
50.0000 mg | ORAL_TABLET | Freq: Every day | ORAL | 1 refills | Status: AC
Start: 2021-07-25 — End: ?

## 2021-07-25 MED ORDER — ANORO ELLIPTA 62.5-25 MCG/ACT IN AEPB: 1.0000 | INHALATION_SPRAY | Freq: Every day | RESPIRATORY_TRACT | 1 refills | Status: AC

## 2021-07-25 MED ORDER — LACTULOSE 10 GM/15ML PO SOLN: 30.0000 g | Freq: Three times a day (TID) | ORAL | 1 refills | Status: DC

## 2021-07-25 MED ORDER — FUROSEMIDE 40 MG PO TABS: 40.0000 mg | ORAL_TABLET | Freq: Every day | ORAL | 1 refills | Status: DC

## 2021-07-25 NOTE — Plan of Care (Signed)
Pt is alert oriented x 4. Pt taking PO lactulose. Pt has flexiseal in place. Primofit in place. Pt turned and repositioned. No distress.   Problem: Education: Goal: Knowledge of General Education information will improve Description: Including pain rating scale, medication(s)/side effects and non-pharmacologic comfort measures Outcome: Progressing   Problem: Health Behavior/Discharge Planning: Goal: Ability to manage health-related needs will improve Outcome: Progressing   Problem: Clinical Measurements: Goal: Ability to maintain clinical measurements within normal limits will improve Outcome: Progressing Goal: Will remain free from infection Outcome: Progressing Goal: Diagnostic test results will improve Outcome: Progressing Goal: Respiratory complications will improve Outcome: Progressing Goal: Cardiovascular complication will be avoided Outcome: Progressing   Problem: Nutrition: Goal: Adequate nutrition will be maintained Outcome: Progressing   Problem: Activity: Goal: Risk for activity intolerance will decrease Outcome: Progressing   Problem: Coping: Goal: Level of anxiety will decrease Outcome: Progressing   Problem: Elimination: Goal: Will not experience complications related to bowel motility Outcome: Progressing Goal: Will not experience complications related to urinary retention Outcome: Progressing   Problem: Pain Managment: Goal: General experience of comfort will improve Outcome: Progressing   Problem: Safety: Goal: Ability to remain free from injury will improve Outcome: Progressing   Problem: Skin Integrity: Goal: Risk for impaired skin integrity will decrease Outcome: Progressing

## 2021-07-25 NOTE — Evaluation (Signed)
Occupational Therapy Evaluation Patient Details Name: [name] MRN: [id number] DOB: 1945-10-08 Today's Date: 07/25/2021   History of Present Illness 75yo male who presented on 11/25 with increased confusion. Admitted with acute hepatic encephalopathy in setting of liver cirrhosis. PMH OA, liver CA, LBP, CAD, heart failure, HLD, DM, HTN, hx ankle fracture   Clinical Impression   Pt admitted for concerns listed above. PTA Pt reported that he was independent at home, when questioned very pt said "I believe I need help with that". At this time pt is a poor historian with cognitive deficits and decreased safety awareness. Additionally, pt requiring increased assist for ADL's and functional mobility for safety and sequencing. Pt demonstrates increased weakness and mild balance concerns.  Recommending HHOT for follow up. Acute OT signing off, as pt has no further acute OT needs.      Recommendations for follow up therapy are one component of a multi-disciplinary discharge planning process, led by the attending physician.  Recommendations may be updated based on patient status, additional functional criteria and insurance authorization.   Follow Up Recommendations  Home health OT    Assistance Recommended at Discharge Frequent or constant Supervision/Assistance  Functional Status Assessment  Patient has had a recent decline in their functional status and demonstrates the ability to make significant improvements in function in a reasonable and predictable amount of time.  Equipment Recommendations  None recommended by OT    Recommendations for Other Services       Precautions / Restrictions Precautions Precautions: Fall Restrictions Weight Bearing Restrictions: No      Mobility Bed Mobility Overal bed mobility: Needs Assistance Bed Mobility: Supine to Sit;Sit to Supine     Supine to sit: Min guard Sit to supine: Min guard   General bed mobility comments: min guard for  safety/line management, HOB elevated and increased time/effort    Transfers Overall transfer level: Needs assistance Equipment used: Rolling walker (2 wheels) Transfers: Sit to/from Stand Sit to Stand: Min guard           General transfer comment: for safety/line management, cues for hand placement      Balance Overall balance assessment: Needs assistance Sitting-balance support: Feet supported;Bilateral upper extremity supported Sitting balance-Leahy Scale: Good Sitting balance - Comments: statically   Standing balance support: Bilateral upper extremity supported;During functional activity;Reliant on assistive device for balance Standing balance-Leahy Scale: Fair                             ADL either performed or assessed with clinical judgement   ADL Overall ADL's : Needs assistance/impaired                                       General ADL Comments: Pt overall needing min guard for safety and cuing for sequencing.     Vision Baseline Vision/History: 1 Wears glasses Ability to See in Adequate Light: 0 Adequate Patient Visual Report: No change from baseline Vision Assessment?: Vision impaired- to be further tested in functional context Additional Comments: Pt having difficulties following commands to complete vision assessment. Was able to read clock and sign on the wall with no difficulties     Perception     Praxis      Pertinent Vitals/Pain Pain Assessment: No/denies pain     Hand Dominance Right   Extremity/Trunk Assessment Upper Extremity Assessment  Upper Extremity Assessment: Overall WFL for tasks assessed   Lower Extremity Assessment Lower Extremity Assessment: Defer to PT evaluation   Cervical / Trunk Assessment Cervical / Trunk Assessment: Kyphotic   Communication Communication Communication: No difficulties   Cognition Arousal/Alertness: Awake/alert Behavior During Therapy: Flat affect Overall Cognitive Status:  No family/caregiver present to determine baseline cognitive functioning                                 General Comments: Pt generally oriented, poor safety awareness, slow to respond, and at times requring increased verbal cueing.     General Comments  VSS on RA    Exercises     Shoulder Instructions      Home Living Family/patient expects to be discharged to:: Private residence Living Arrangements: Spouse/significant other Available Help at Discharge: Family Type of Home: House Home Access: Stairs to enter [employer] of Steps: 1 Entrance Stairs-Rails: Left Home Layout: Two level;Able to live on main level with bedroom/bathroom     Bathroom Shower/Tub: Teacher, early years/pre: Standard Bathroom Accessibility: Yes   Home Equipment: Conservation officer, nature (2 wheels);Shower seat;BSC/3in1;Cane - single point   Additional Comments: pt lives with wife, son, dtr-in-law, and 32 yo grand-dtr; very poor historian, so all information taken from prior charting      Prior Functioning/Environment Prior Level of Function : Patient poor historian/Family not available                        OT Problem List: Decreased strength;Decreased activity tolerance;Impaired balance (sitting and/or standing);Decreased coordination;Decreased cognition;Decreased safety awareness      OT Treatment/Interventions:      OT Goals(Current goals can be found in the care plan section) Acute Rehab OT Goals Patient Stated Goal: To go home OT Goal Formulation: Patient unable to participate in goal setting Time For Goal Achievement: 07/25/21 Potential to Achieve Goals: Fair  OT Frequency:     Barriers to D/C:            Co-evaluation              AM-PAC OT "6 Clicks" Daily Activity     Outcome Measure Help from another person eating meals?: A Little Help from another person taking care of personal grooming?: A Little Help from another person toileting,  which includes using toliet, bedpan, or urinal?: A Little Help from another person bathing (including washing, rinsing, drying)?: A Little Help from another person to put on and taking off regular upper body clothing?: A Little Help from another person to put on and taking off regular lower body clothing?: A Little 6 Click Score: 18   End of Session Equipment Utilized During Treatment: Gait belt;Rolling walker (2 wheels) Nurse Communication: Mobility status  Activity Tolerance: Patient tolerated treatment well Patient left: in bed;with call bell/phone within reach;with bed alarm set  OT Visit Diagnosis: Unsteadiness on feet (R26.81);Other abnormalities of gait and mobility (R26.89);Muscle weakness (generalized) (M62.81)                Time: 8315-1761 OT Time Calculation (min): 23 min Charges:  OT General Charges $OT Visit: 1 Visit OT Evaluation $OT Eval Moderate Complexity: 1 Mod OT Treatments $Self Care/Home Management : 8-22 mins  Jlon Betker H., OTR/L Acute Rehabilitation  [place]y Lovecchio Elane Yolanda Bonine 07/25/2021, 4:49 PM

## 2021-07-25 NOTE — Discharge Summary (Signed)
Physician Discharge Summary  [name]:[id number] DOB: 1946/03/24 DOA: 07/23/2021  PCP: [name], MD  Admit date: 07/23/2021 Discharge date: 07/25/2021 Admitted From: Home Disposition: Home Recommendations for Outpatient Follow-up:  Follow ups as below. Please obtain CBC/BMP/Mag at follow up Encourage compliance with his medication. Please follow up on the following pending results: None Home Health: PT/OT Equipment/Devices: Rolling walker and 3 in 1 commode Discharge Condition: Stable CODE STATUS: Full code  Follow-up Information     [name], MD. Schedule an appointment as soon as possible for a visit in 1 week(s).   Specialty: Internal Medicine Contact information: [place] [address] [phone]         [facility], [place] Follow up.   Specialty: Home Health Services Why: the office will call to schedule home health visits Contact information: [address] [place] [address] [phone]                Hospital Course: 75 year old M with PMH of liver cirrhosis with ascites s/p TIPs, HCC, recurrent hepatic encephalopathy, GIB s/p embolization of gastric varices in 03/4131, diastolic CHF, CAD, pancytopenia and recent hospitalization from 10/18-2010/21 for hepatic encephalopathy returning with altered mental status and admitted for hepatic encephalopathy again.  Ammonia elevated to 135.  CTH and ABG without significant finding.  Patient was started on rectal lactulose.  On the day of discharge, ammonia down to 44.  Encephalopathy resolved.  He felt well and ready to go home.  Home health PT/OT and DME ordered as recommended by therapy.  Patient reports living with his wife and his son, and has 24/7 supervision.  I was not able to get hold of patient's son over the phone.  Patient has been encouraged to take his medications as prescribed.  I have renewed his prescription for lactulose,  Aldactone and Lasix.  Also counseled on sodium and fluid restriction, and need for outpatient follow-up.  See individual problem list below for more on hospital course.  Discharge Diagnoses:  Recurrent hepatic encephalopathy-ammonia 135>>> 44.  ABG and CTH negative.  Resolved. History of liver cirrhosis s/p TIPS-low suspicion for SBP.  History of hepatocellular carcinoma: -Continue lactulose 30 g 3 times daily  -Continue home Lasix, Aldactone and rifaximin. -Counseled on the importance of compliance, sodium and fluid restriction.   Hypokalemia: K 3.1. -Received p.o. KCl 40 mEq x 2 prior to discharge. -He is also on Aldactone. -Recheck at follow-up in 1 week   Pancytopenia: Likely due to liver cirrhosis.  Stable. -Recheck CBC at follow-up.   Chronic diastolic CHF: TTE in 11/4008 with LVEF of 60 to 65%, G3-DD.  No cardiopulmonary symptoms but has some edema with lymphedema. -Diuretics as above.   Asthma: Stable. -Continue Anoro Ellipta and as needed albuterol   CAD: Stable.  No anginal symptoms. -Not on antiplatelet due to history of GI bleed.   Physical deconditioning: Improved. -HH PT/OT, rolling walker and 3 in 1 commode ordered.   Class I obesity Body mass index is 34.25 kg/m.           Discharge Exam: Vitals:   07/25/21 0500 07/25/21 0725 07/25/21 0822 07/25/21 1245  BP:   (!) 127/55 Comment: RN Notified (!) 135/53  Pulse:   67 71  Temp:   98.1 F (36.7 C) 98.2 F (36.8 C)  Resp:   14 14  Height:      Weight: 99.2 kg     SpO2:  97% 99%  99%  TempSrc:   Oral Oral  BMI (Calculated): 34.24        GENERAL: No apparent distress.  Sitting on the edge of the bed eating breakfast. HEENT: MMM.  Vision and hearing grossly intact.  NECK: Supple.  No apparent JVD.  RESP: 99% on RA.  No IWOB.  Fair aeration bilaterally. CVS:  RRR. Heart sounds normal.  ABD/GI/GU: Bowel sounds present. Soft. Non tender.  MSK/EXT:  Moves extremities. No apparent deformity.  Chronic  lymphedema. SKIN: no apparent skin lesion or wound NEURO: Awake and alert.  Oriented x4.  No apparent focal neuro deficit. PSYCH: Calm. Normal affect.   Discharge Instructions  Discharge Instructions     (HEART FAILURE PATIENTS) Call MD:  Anytime you have any of the following symptoms: 1) 3 pound weight gain in 24 hours or 5 pounds in 1 week 2) shortness of breath, with or without a dry hacking cough 3) swelling in the hands, feet or stomach 4) if you have to sleep on extra pillows at night in order to breathe.   Complete by: As directed    Call MD for:  difficulty breathing, headache or visual disturbances   Complete by: As directed    Call MD for:  extreme fatigue   Complete by: As directed    Call MD for:  persistant dizziness or light-headedness   Complete by: As directed    Diet - low sodium heart healthy   Complete by: As directed    Discharge instructions   Complete by: As directed    It has been a pleasure taking care of you!  You were hospitalized due to confusion/altered mental status due to high ammonia level probably from not taking your medications.  It is very important that you take your medications as prescribed, and to have 2-3 bowel movements a day to avoid this from happening again.  Please review your new medication list and the directions on your medications before you take them.  Follow-up with your primary care doctor in 1 to 2 weeks.  In addition to taking your medications as prescribed, we also recommend you avoid alcohol or over-the-counter pain medication other than plain Tylenol, limit the amount of water/fluid you drink to less than 6 cups (1500 cc) a day,  limit your sodium (salt) intake to less than 2 g (2000 mg) a day and weigh yourself daily at the same time and keeping your weight log.     Take care,   Increase activity slowly   Complete by: As directed       Allergies as of 07/25/2021       Reactions   Fluzone Quadrivalent [influenza Vac  Split Quad] Other (See Comments)   The patient stated, in 10/2020: "I am not taking any more flu shots. It liked to have killed me."        Medication List     TAKE these medications    Accu-Chek Softclix Lancets lancets USE AS DIRECTED TO TEST BLOOD SUGAR FOUR TIMES DAILY   albuterol 108 (90 Base) MCG/ACT inhaler Commonly known as: VENTOLIN HFA Inhale 2 puffs into the lungs every 6 (six) hours as needed for wheezing or shortness of breath.   Anoro Ellipta 62.5-25 MCG/ACT Aepb Generic drug: umeclidinium-vilanterol Inhale 1 puff into the lungs daily.   blood glucose meter kit and supplies Dispense based on patient and insurance preference. Use up to four times daily as directed. (FOR ICD-10 E10.9, E11.9).   ferrous sulfate 325 (65  FE) MG tablet Take 1 tablet (325 mg total) by mouth daily with breakfast.   furosemide 40 MG tablet Commonly known as: LASIX Take 1 tablet (40 mg total) by mouth daily.   lactulose 10 GM/15ML solution Commonly known as: CHRONULAC Take 45 mLs (30 g total) by mouth 3 (three) times daily.   pantoprazole 40 MG tablet Commonly known as: PROTONIX Take 1 tablet (40 mg total) by mouth 2 (two) times daily before a meal.   spironolactone 50 MG tablet Commonly known as: ALDACTONE Take 1 tablet (50 mg total) by mouth daily.   Xifaxan 550 MG Tabs tablet Generic drug: rifaximin Take 1 tablet (550 mg total) by mouth 2 (two) times daily.               Durable Medical Equipment  (From admission, onward)           Start     Ordered   07/25/21 0959  For home use only DME Bedside commode  Once       Question:  Patient needs a bedside commode to treat with the following condition  Answer:  Generalized weakness   07/25/21 0958   07/25/21 0958  For home use only DME Walker rolling  Once       Question Answer Comment  Walker: With 5 Inch Wheels   Patient needs a walker to treat with the following condition Generalized weakness      07/25/21  0958            Consultations: None  Procedures/Studies:   CT Head Wo Contrast  Result Date: 07/23/2021 CLINICAL DATA:  Mental status change. EXAM: CT HEAD WITHOUT CONTRAST TECHNIQUE: Contiguous axial images were obtained from the base of the skull through the vertex without intravenous contrast. COMPARISON:  Head CT 05/28/2021. FINDINGS: Brain: No evidence of acute infarction, hemorrhage, hydrocephalus, extra-axial collection or mass lesion/mass effect. There is stable mild patchy periventricular and deep white matter hypodensity, likely chronic small vessel ischemic change. This is similar to the prior study. Vascular: Atherosclerotic calcifications are present within the cavernous internal carotid arteries. Skull: Normal. Negative for fracture or focal lesion. Sinuses/Orbits: No acute finding. Other: None. IMPRESSION: No acute intracranial abnormality. Electronically Signed   By: Ronney Asters M.D.   On: 07/23/2021 18:41       The results of significant diagnostics from this hospitalization (including imaging, microbiology, ancillary and laboratory) are listed below for reference.     Microbiology: Recent Results (from the past 240 hour(s))  Resp Panel by RT-PCR (Flu A&B, Covid) Nasopharyngeal Swab     Status: None   Collection Time: 07/23/21  4:40 PM   Specimen: Nasopharyngeal Swab; Nasopharyngeal(NP) swabs in vial transport medium  Result Value Ref Range Status   SARS Coronavirus 2 by RT PCR NEGATIVE NEGATIVE Final    Comment: (NOTE) SARS-CoV-2 target nucleic acids are NOT DETECTED.  The SARS-CoV-2 RNA is generally detectable in upper respiratory specimens during the acute phase of infection. The lowest concentration of SARS-CoV-2 viral copies this assay can detect is 138 copies/mL. A negative result does not preclude SARS-Cov-2 infection and should not be used as the sole basis for treatment or other patient management decisions. A negative result may occur with   improper specimen collection/handling, submission of specimen other than nasopharyngeal swab, presence of viral mutation(s) within the areas targeted by this assay, and inadequate number of viral copies(<138 copies/mL). A negative result must be combined with clinical observations, patient history, and epidemiological information. The  expected result is Negative.  Fact Sheet for Patients:  EntrepreneurPulse.com.au  Fact Sheet for Healthcare Providers:  IncredibleEmployment.be  This test is no t yet approved or cleared by the [place] FDA and  has been authorized for detection and/or diagnosis of SARS-CoV-2 by FDA under an Emergency Use Authorization (EUA). This EUA will remain  in effect (meaning this test can be used) for the duration of the COVID-19 declaration under Section 564(b)(1) of the Act, 21 U.S.C.section 360bbb-3(b)(1), unless the authorization is terminated  or revoked sooner.       Influenza A by PCR NEGATIVE NEGATIVE Final   Influenza B by PCR NEGATIVE NEGATIVE Final    Comment: (NOTE) The Xpert Xpress SARS-CoV-2/FLU/RSV plus assay is intended as an aid in the diagnosis of influenza from Nasopharyngeal swab specimens and should not be used as a sole basis for treatment. Nasal washings and aspirates are unacceptable for Xpert Xpress SARS-CoV-2/FLU/RSV testing.  Fact Sheet for Patients: EntrepreneurPulse.com.au  Fact Sheet for Healthcare Providers: IncredibleEmployment.be  This test is not yet approved or cleared by the [place] FDA and has been authorized for detection and/or diagnosis of SARS-CoV-2 by FDA under an Emergency Use Authorization (EUA). This EUA will remain in effect (meaning this test can be used) for the duration of the COVID-19 declaration under Section 564(b)(1) of the Act, 21 U.S.C. section 360bbb-3(b)(1), unless the authorization is terminated  or revoked.  Performed at [facility], [place] [address] [place], [place] [address]      Labs:  CBC: Recent Labs  Lab 07/19/21 1204 07/23/21 1626 07/23/21 1723 07/24/21 0216 07/25/21 0748  WBC 2.4* 1.7*  --  3.3* 3.2*  NEUTROABS 1.6* 1.0*  --   --   --   HGB 7.3* 7.8* 8.5* 7.9* 8.2*  HCT 23.3* 26.1* 25.0* 26.0* 26.0*  MCV 86.6 91.9  --  91.2 90.6  PLT 90* 96*  --  101* 101*   BMP &GFR Recent Labs  Lab 07/23/21 1626 07/23/21 1723 07/24/21 0216 07/25/21 0748  NA 139 141 140 137  K 3.0* 3.1* 2.9* 3.1*  CL 108  --  111 108  CO2 23  --  22 21*  GLUCOSE 105*  --  111* 112*  BUN 12  --  12 11  CREATININE 1.12  --  1.01 1.03  CALCIUM 8.1*  --  8.2* 8.2*  MG 1.9  --   --  1.7  PHOS  --   --   --  2.8   Estimated Creatinine Clearance: 69.5 mL/min (by C-G formula based on SCr of 1.03 mg/dL). Liver & Pancreas: Recent Labs  Lab 07/23/21 1626 07/24/21 0216 07/25/21 0748  AST 36 34  --   ALT 22 21  --   ALKPHOS 157* 152*  --   BILITOT 1.1 1.0  --   PROT 5.1* 4.8*  --   ALBUMIN 2.2* 2.0* 2.2*   No results for input(s): LIPASE, AMYLASE in the last 168 hours. Recent Labs  Lab 07/23/21 1627 07/25/21 0748  AMMONIA 135* 44*   Diabetic: No results for input(s): HGBA1C in the last 72 hours. Recent Labs  Lab 07/23/21 1631  GLUCAP 105*   Cardiac Enzymes: No results for input(s): CKTOTAL, CKMB, CKMBINDEX, TROPONINI in the last 168 hours. No results for input(s): PROBNP in the last 8760 hours. Coagulation Profile: Recent Labs  Lab 07/23/21 1649 07/24/21 0216  INR 1.3* 1.3*   Thyroid Function Tests: No results for input(s): TSH, T4TOTAL, FREET4, T3FREE,  THYROIDAB in the last 72 hours. Lipid Profile: No results for input(s): CHOL, HDL, LDLCALC, TRIG, CHOLHDL, LDLDIRECT in the last 72 hours. Anemia Panel: No results for input(s): VITAMINB12, FOLATE, FERRITIN, TIBC, IRON, RETICCTPCT in the last 72 hours. Urine analysis:    Component Value Date/Time    COLORURINE YELLOW 07/24/2021 0407   APPEARANCEUR HAZY (A) 07/24/2021 0407   LABSPEC 1.017 07/24/2021 0407   PHURINE 5.0 07/24/2021 0407   GLUCOSEU NEGATIVE 07/24/2021 0407   HGBUR MODERATE (A) 07/24/2021 0407   BILIRUBINUR NEGATIVE 07/24/2021 0407   KETONESUR NEGATIVE 07/24/2021 0407   PROTEINUR NEGATIVE 07/24/2021 0407   UROBILINOGEN 0.2 03/29/2014 1546   NITRITE NEGATIVE 07/24/2021 0407   LEUKOCYTESUR MODERATE (A) 07/24/2021 0407   Sepsis Labs: Invalid input(s): PROCALCITONIN, LACTICIDVEN   Time coordinating discharge: 45 minutes  SIGNED:  Mercy Riding, MD  [facility] 07/25/2021, 12:54 PM

## 2021-07-25 NOTE — Progress Notes (Signed)
Notified by telemetry tech that pts had 2.51 second pause. Pts heart rhythm is normal sinus with first degree heart block the patient has been resting no distress noted.  On call triad provider paged to inform.

## 2021-07-25 NOTE — TOC Transition Note (Signed)
Transition of Care [facility]) - CM/SW Discharge Note   Patient Details  Name: [name] MRN: [id number] Date of Birth: 09/12/1945  Transition of Care [facility]) CM/SW Contact:  Bartholomew Crews, RN Phone Number: [phone] 07/25/2021, 11:40 AM   Clinical Narrative:     Spoke with patient at the bedside to discuss plans to transition home. Patient confirmed that he has a DME RW and 3/1 at home. He is currently active with HH. Verified that he is active with [facility]. HH orders provided for resumption of services. Son to provide transportation home. No further TOC needs at this time.   Final next level of care: [place] Barriers to Discharge: No Barriers Identified   Patient Goals and CMS Choice Patient states their goals for this hospitalization and ongoing recovery are:: return home CMS Medicare.gov Compare Post Acute Care list provided to:: Patient Choice offered to / list presented to : Patient  Discharge Placement                       Discharge Plan and Services                DME Arranged: N/A DME Agency: NA       HH Arranged: PT, OT HH Agency: [place] Date [place]: 07/25/21 Time [place]: 1139 Representative spoke with at [place]: [place] ([place]) Interventions     Readmission Risk Interventions Readmission Risk Prevention Plan 01/26/2021 08/12/2020 08/12/2020  Transportation Screening Complete - Complete  PCP or Specialist Appt within 5-7 Days - - -  Not Complete comments - - -  PCP or Specialist Appt within 3-5 Days - Complete -  Home Care Screening - - -  Medication Review (RN CM) - - -  [place] or [place] - - Complete  Social Work Consult for [place] Planning/Counseling - - Complete  Palliative Care Screening - - Not Applicable  Medication Review Press photographer) Complete - -  PCP or Specialist appointment within 3-5 days of discharge Complete - -   [place] or [place] Complete - -  SW Recovery Care/Counseling Consult Complete - -  Palliative Care Screening Not Applicable - -  [place] Patient Refused - -  Some recent data might be hidden

## 2021-07-26 ENCOUNTER — Ambulatory Visit: Payer: Medicare HMO | Admitting: Cardiovascular Disease

## 2021-07-26 ENCOUNTER — Other Ambulatory Visit: Payer: Self-pay | Admitting: Physician Assistant

## 2021-07-26 DIAGNOSIS — D649 Anemia, unspecified: Secondary | ICD-10-CM

## 2021-07-27 ENCOUNTER — Inpatient Hospital Stay (HOSPITAL_BASED_OUTPATIENT_CLINIC_OR_DEPARTMENT_OTHER): Payer: Medicare HMO | Admitting: Physician Assistant

## 2021-07-27 ENCOUNTER — Inpatient Hospital Stay: Payer: Medicare HMO

## 2021-07-27 ENCOUNTER — Telehealth: Payer: Self-pay

## 2021-07-27 ENCOUNTER — Other Ambulatory Visit: Payer: Self-pay

## 2021-07-27 VITALS — BP 115/51 | HR 63 | Temp 97.9°F | Resp 18

## 2021-07-27 DIAGNOSIS — D61818 Other pancytopenia: Secondary | ICD-10-CM

## 2021-07-27 DIAGNOSIS — K7460 Unspecified cirrhosis of liver: Secondary | ICD-10-CM | POA: Diagnosis not present

## 2021-07-27 DIAGNOSIS — D649 Anemia, unspecified: Secondary | ICD-10-CM

## 2021-07-27 DIAGNOSIS — D696 Thrombocytopenia, unspecified: Secondary | ICD-10-CM | POA: Diagnosis not present

## 2021-07-27 LAB — CBC WITH DIFFERENTIAL (CANCER CENTER ONLY)
Abs Immature Granulocytes: 0.01 10*3/uL (ref 0.00–0.07)
Basophils Absolute: 0 10*3/uL (ref 0.0–0.1)
Basophils Relative: 0 %
Eosinophils Absolute: 0.1 10*3/uL (ref 0.0–0.5)
Eosinophils Relative: 2 %
HCT: 24.7 % — ABNORMAL LOW (ref 39.0–52.0)
Hemoglobin: 7.7 g/dL — ABNORMAL LOW (ref 13.0–17.0)
Immature Granulocytes: 0 %
Lymphocytes Relative: 8 %
Lymphs Abs: 0.4 10*3/uL — ABNORMAL LOW (ref 0.7–4.0)
MCH: 27.8 pg (ref 26.0–34.0)
MCHC: 31.2 g/dL (ref 30.0–36.0)
MCV: 89.2 fL (ref 80.0–100.0)
Monocytes Absolute: 0.4 10*3/uL (ref 0.1–1.0)
Monocytes Relative: 8 %
Neutro Abs: 3.7 10*3/uL (ref 1.7–7.7)
Neutrophils Relative %: 82 %
Platelet Count: 90 10*3/uL — ABNORMAL LOW (ref 150–400)
RBC: 2.77 MIL/uL — ABNORMAL LOW (ref 4.22–5.81)
RDW: 19.1 % — ABNORMAL HIGH (ref 11.5–15.5)
WBC Count: 4.5 10*3/uL (ref 4.0–10.5)
nRBC: 0 % (ref 0.0–0.2)

## 2021-07-27 LAB — SAMPLE TO BLOOD BANK

## 2021-07-27 NOTE — Progress Notes (Signed)
[place]  PROGRESS NOTE  Patient Care Team: [name], MD as PCP - General (Internal Medicine) Nahser, Wonda Cheng, MD as PCP - Cardiology (Cardiology) Clance, Armando Reichert, MD as Consulting Physician (Pulmonary Disease) Greer Pickerel, MD as Consulting Physician (General Surgery) Wonda Horner, MD (Gastroenterology) Rana Snare, MD (Inactive) (Urology) Hayden Pedro, MD (Ophthalmology)  CHIEF COMPLAINTS/PURPOSE OF CONSULTATION:  Pancytopenia  HISTORY OF PRESENTING ILLNESS:   [name] 75 y.o. male is here because of pancytopenia from liver cirrhosis. Since the last visit on 07/06/2021, patient has been admitted again for hepatic encephalopathy.  He was most recently discharged from the hospital on 07/25/2021.  On exam today, Mr. Hoefling continues to be deconditioned. His energy levels have mildly improved since recent hospital discharge.  His appetite is unchanged. He is in a wheelchair during today's visit but reports that he tries to walk using a rolling walker at home. He denies nausea,vomiting or abdominal pain.  His bowel habits are unchanged and denies diarrhea or constipation. He denies any signs of bleeding including hematochezia or melena.  Patient has persistent bilateral lower extremity edema that is currently managed with spironolactone and furosemide.  He denies any fevers, chills, night sweats, chest pain, shortness of breath or cough.  He has no other complaints.  Rest of the 10 point ROS is below   MEDICAL HISTORY:  Past Medical History:  Diagnosis Date   Arthritis    "joints tighten up sometimes" (03/27/2104)   Carcinoma of liver, hepatocellular ([place]) 06/30/2020   Chronic lower back pain    Coronary artery disease involving native coronary artery of native heart without angina pectoris    Severe left main disease at catheterization July 2015  CABG x3 with a LIMA to the LAD, SVG to the OM, SVG to the PDA on 03/31/14. EF 60% by cath.     Diastolic heart failure (HCC)    GERD without esophagitis 08/04/2010   Hepatic cirrhosis ([place])    a. Dx 01/2014 - CT a/p    History of blood transfusion    "related to bleeding ulcers"   History of concussion    1976--  NO RESIDUAL   History of GI bleed    a. UGIB 07/2012;  b. 01/2014 admission with GIB/FOB stool req 1U prbc's->EGD showed portal gastropathy, barrett's esoph, and chronic active h. pylori gastritis.   History of gout    2007 &  2008  LEFT LEG-- NO ISSUE SINCE   Hyperlipidemia    Iron deficiency anemia    Kidney stones    OA (osteoarthritis of spine)    LOWER BACK--  INTERMITTANT LEFT LEG NUMBNESS   OSA (obstructive sleep apnea)    PULMOLOGIST-  DR CLANCE--  MODERATE OSA  STARTED CPAP 2012--  BUT CURRENTLY HAS NOT USED PAST 6 MONTHS   Phimosis    a. s/p circumcision 2015.   Type 2 diabetes mellitus ([place])    Unspecified essential hypertension     SURGICAL HISTORY: Past Surgical History:  Procedure Laterality Date   ANKLE FRACTURE SURGERY Right 1989   "plate put in"   APPENDECTOMY  05-16-2004   open   BIOPSY  01/08/2021   Procedure: BIOPSY;  Surgeon: Wilford Corner, MD;  Location: [facility];  Service: Endoscopy;;   BIOPSY  05/02/2021   Procedure: BIOPSY;  Surgeon: Arta Silence, MD;  Location: [facility];  Service: Endoscopy;;   CIRCUMCISION N/A 09/09/2013   Procedure: CIRCUMCISION ADULT;  Surgeon: Bernestine Amass, MD;  Location: [place];  Service: Urology;  Laterality: N/A;   COLECTOMY  05-16-2004   COLONOSCOPY N/A 05/02/2021   Procedure: COLONOSCOPY;  Surgeon: Arta Silence, MD;  [facility];  Service: Endoscopy;  Laterality: N/A;   COLONOSCOPY WITH PROPOFOL N/A 11/20/2020   Procedure: COLONOSCOPY WITH PROPOFOL;  Surgeon: Ronald Lobo, MD;  [facility];  Service: Endoscopy;  Laterality: N/A;   CORONARY ANGIOPLASTY WITH STENT PLACEMENT  06/28/2012  DR COOPER   PCI W/  X1 DES to [place]. LAD/  LM  40% OSTIAL & 50-60%  DISTAL /  50% PROX LCX/  30-40% PROX RCA & 50% MID RCA/   LVEF 65-70%   CORONARY ARTERY BYPASS GRAFT N/A 03/31/2014   Procedure: CORONARY ARTERY BYPASS GRAFTING (CABG) times 3 using left internal mammary artery and right saphenous vein.;  Surgeon: Melrose Nakayama, MD;  Location: [place];  Service: Open Heart Surgery;  Laterality: N/A;   DOBUTAMINE STRESS ECHO  06-08-2012   MODERATE HYPOKINESIS/ ISCHEMIA MID INFERIOR WALL   ESOPHAGOGASTRODUODENOSCOPY  08/15/2012   Procedure: ESOPHAGOGASTRODUODENOSCOPY (EGD);  Surgeon: Wonda Horner, MD;  Location: [facility] ENDOSCOPY;  Service: Endoscopy;  Laterality: N/A;   ESOPHAGOGASTRODUODENOSCOPY N/A 02/17/2014   Procedure: ESOPHAGOGASTRODUODENOSCOPY (EGD);  Surgeon: Jeryl Columbia, MD;  Location[facility] ENDOSCOPY;  Service: Endoscopy;  Laterality: N/A;   ESOPHAGOGASTRODUODENOSCOPY N/A 04/17/2020   Procedure: ESOPHAGOGASTRODUODENOSCOPY (EGD);  Surgeon: Ronnette Juniper, MD;  Location: [place];  Service: Gastroenterology;  Laterality: N/A;   ESOPHAGOGASTRODUODENOSCOPY N/A 09/11/2020   Procedure: ESOPHAGOGASTRODUODENOSCOPY (EGD);  Surgeon: Clarene Essex, MD;  Location: [facility];  Service: Endoscopy;  Laterality: N/A;   ESOPHAGOGASTRODUODENOSCOPY  10/09/2020   ESOPHAGOGASTRODUODENOSCOPY N/A 10/09/2020   Procedure: ESOPHAGOGASTRODUODENOSCOPY (EGD);  Surgeon: Otis Brace, MD;  Location: [facility] ENDOSCOPY;  Service: Gastroenterology;  Laterality: N/A;   ESOPHAGOGASTRODUODENOSCOPY N/A 01/08/2021   Procedure: ESOPHAGOGASTRODUODENOSCOPY (EGD);  Surgeon: Wilford Corner, MD;  Location: [facility];  Service: Endoscopy;  Laterality: N/A;   ESOPHAGOGASTRODUODENOSCOPY N/A 05/02/2021   Procedure: ESOPHAGOGASTRODUODENOSCOPY (EGD);  Surgeon: Arta Silence, MD;  Location: [facility];  Service: Endoscopy;  Laterality: N/A;   ESOPHAGOGASTRODUODENOSCOPY (EGD) WITH PROPOFOL N/A 09/30/2017   Procedure: ESOPHAGOGASTRODUODENOSCOPY (EGD) WITH PROPOFOL;  Surgeon: Wonda Horner, MD;  Location:  [facility] ENDOSCOPY;  Service: Endoscopy;  Laterality: N/A;   ESOPHAGOGASTRODUODENOSCOPY (EGD) WITH PROPOFOL N/A 12/20/2017   Procedure: ESOPHAGOGASTRODUODENOSCOPY (EGD) WITH PROPOFOL;  Surgeon: Clarene Essex, MD;  Location: [place];  Service: Endoscopy;  Laterality: N/A;   ESOPHAGOGASTRODUODENOSCOPY (EGD) WITH PROPOFOL N/A 08/10/2020   Procedure: ESOPHAGOGASTRODUODENOSCOPY (EGD) WITH PROPOFOL;  Surgeon: Wilford Corner, MD;  Location: [place];  Service: Endoscopy;  Laterality: N/A;   ESOPHAGOGASTRODUODENOSCOPY (EGD) WITH PROPOFOL N/A 11/20/2020   Procedure: ESOPHAGOGASTRODUODENOSCOPY (EGD) WITH PROPOFOL;  Surgeon: Ronald Lobo, MD;  [facility];  Service: Endoscopy;  Laterality: N/A;   ESOPHAGOGASTRODUODENOSCOPY (EGD) WITH PROPOFOL N/A 02/28/2021   Procedure: ESOPHAGOGASTRODUODENOSCOPY (EGD) WITH PROPOFOL;  Surgeon: Clarene Essex, MD;  [facility];  Service: Endoscopy;  Laterality: N/A;   GIVENS CAPSULE STUDY N/A 11/20/2020   Procedure: GIVENS CAPSULE STUDY;  Surgeon: Ronald Lobo, MD;  [facility];  Service: Endoscopy;  Laterality: N/A;   HEMOSTASIS CLIP PLACEMENT  05/02/2021   Procedure: HEMOSTASIS CLIP PLACEMENT;  Surgeon: Arta Silence, MD;  [facility];  Service: Endoscopy;;   HERNIA REPAIR     INTRAOPERATIVE TRANSESOPHAGEAL ECHOCARDIOGRAM N/A 03/31/2014   Procedure: INTRAOPERATIVE TRANSESOPHAGEAL ECHOCARDIOGRAM;  Surgeon: Melrose Nakayama, MD;  Location: [place];  Service: Open Heart Surgery;  Laterality: N/A;  IR ANGIOGRAM SELECTIVE EACH ADDITIONAL VESSEL  06/16/2020   IR ANGIOGRAM SELECTIVE EACH ADDITIONAL VESSEL  06/16/2020   IR ANGIOGRAM SELECTIVE EACH ADDITIONAL VESSEL  06/16/2020   IR ANGIOGRAM SELECTIVE EACH ADDITIONAL VESSEL  06/16/2020   IR ANGIOGRAM SELECTIVE EACH ADDITIONAL VESSEL  06/16/2020   IR ANGIOGRAM SELECTIVE EACH ADDITIONAL VESSEL  06/16/2020   IR ANGIOGRAM SELECTIVE EACH ADDITIONAL VESSEL  06/16/2020   IR ANGIOGRAM  SELECTIVE EACH ADDITIONAL VESSEL  06/16/2020   IR ANGIOGRAM SELECTIVE EACH ADDITIONAL VESSEL  06/16/2020   IR ANGIOGRAM SELECTIVE EACH ADDITIONAL VESSEL  06/30/2020   IR ANGIOGRAM SELECTIVE EACH ADDITIONAL VESSEL  06/30/2020   IR ANGIOGRAM VISCERAL SELECTIVE  06/16/2020   IR ANGIOGRAM VISCERAL SELECTIVE  06/30/2020   IR EMBO ARTERIAL NOT HEMORR HEMANG INC GUIDE ROADMAPPING  06/16/2020   IR EMBO TUMOR ORGAN ISCHEMIA INFARCT INC GUIDE ROADMAPPING  06/30/2020   IR EMBO VENOUS NOT HEMORR HEMANG  INC GUIDE ROADMAPPING  01/13/2021   IR IVUS EACH ADDITIONAL NON CORONARY VESSEL  01/13/2021   IR RADIOLOGIST EVAL & MGMT  05/06/2020   IR RADIOLOGIST EVAL & MGMT  05/20/2020   IR RADIOLOGIST EVAL & MGMT  05/25/2021   IR TIPS  01/13/2021   IR US GUIDE VASC ACCESS RIGHT  06/16/2020   IR US GUIDE VASC ACCESS RIGHT  06/30/2020   IR US GUIDE VASC ACCESS RIGHT  01/13/2021   LAPAROSCOPIC UMBILICAL HERNIA REPAIR W/ MESH  06-06-2011   LEFT HEART CATHETERIZATION WITH CORONARY ANGIOGRAM N/A 03/28/2014   Procedure: LEFT HEART CATHETERIZATION WITH CORONARY ANGIOGRAM;  Surgeon: Sinclair Grooms, MD;  Location[facility] CATH LAB;  Service: Cardiovascular;  Laterality: N/A;   LIPOMA EXCISION Left 08/25/2017   Procedure: EXCISION LIPOMA LEFT POSTERIOR THIGH;  Surgeon: Clovis Riley, MD;  Location: [place];  Service: General;  Laterality: Left;   NEPHROLITHOTOMY  1990'S   OPEN APPENDECTOMY W/ PARTIAL CECECTOMY  05-16-2004   PERCUTANEOUS CORONARY STENT INTERVENTION (PCI-S) N/A 06/28/2012   Procedure: PERCUTANEOUS CORONARY STENT INTERVENTION (PCI-S);  Surgeon: Sherren Mocha, MD;  Location: [facility] CATH LAB;  Service: Cardiovascular;  Laterality: N/A;   POLYPECTOMY  11/20/2020   Procedure: POLYPECTOMY;  Surgeon: Ronald Lobo, MD;  [facility];  Service: Endoscopy;;   POLYPECTOMY  05/02/2021   Procedure: POLYPECTOMY;  Surgeon: Arta Silence, MD;  [facility];  Service: Endoscopy;;   RADIOLOGY WITH ANESTHESIA N/A  01/13/2021   Procedure: IR WITH ANESTHESIA - TIPS;  Surgeon: Suzette Battiest, MD;  Location: [place];  Service: Radiology;  Laterality: N/A;    SOCIAL HISTORY: Social History   Socioeconomic History   Marital status: Married    Spouse name: Not on file   Number of children: Not on file   Years of education: Not on file   Highest education level: Not on file  Occupational History   Not on file  Tobacco Use   Smoking status: Former    Packs/day: 2.00    Years: 40.00    Pack years: 80.00    Types: Cigarettes    Quit date: 08/29/1996    Years since quitting: 24.9   Smokeless tobacco: Never  Vaping Use   Vaping Use: Never used  Substance and Sexual Activity   Alcohol use: Not Currently    Alcohol/week: 8.0 standard drinks    Types: 8 Cans of beer per week    Comment: 2 beers every other day   Drug use: No   Sexual activity: Yes  Other Topics Concern  Not on file  Social History Narrative   Lives in [place] with wife, son, and son's family.   Social Determinants of Health   Financial Resource Strain: Medium Risk   Difficulty of Paying Living Expenses: Somewhat hard  Food Insecurity: No Food Insecurity   Worried About Charity fundraiser in the Last Year: Never true   Ran Out of Food in the Last Year: Never true  Transportation Needs: No Transportation Needs   Lack of Transportation (Medical): No   Lack of Transportation (Non-Medical): No  Physical Activity: Not on file  Stress: Not on file  Social Connections: Not on file  Intimate Partner Violence: Not on file    FAMILY HISTORY: Family History  Problem Relation Age of Onset   Lung cancer Sister    Cancer Sister        lung   Cancer Mother    Cancer Father        died in his 28s.   Cancer Brother        lung   Coronary artery disease Other    Diabetes Other    Colon cancer Other    Cancer Sister        lung    ALLERGIES:  is allergic to fluzone quadrivalent [influenza vac split quad].  MEDICATIONS:   Current Outpatient Medications  Medication Sig Dispense Refill   Accu-Chek Softclix Lancets lancets USE AS DIRECTED TO TEST BLOOD SUGAR FOUR TIMES DAILY 300 each 3   albuterol (VENTOLIN HFA) 108 (90 Base) MCG/ACT inhaler Inhale 2 puffs into the lungs every 6 (six) hours as needed for wheezing or shortness of breath. 8 g 0   ANORO ELLIPTA 62.5-25 MCG/ACT AEPB Inhale 1 puff into the lungs daily. 1 each 1   blood glucose meter kit and supplies Dispense based on patient and insurance preference. Use up to four times daily as directed. (FOR ICD-10 E10.9, E11.9). 1 each 0   ferrous sulfate 325 (65 FE) MG tablet Take 1 tablet (325 mg total) by mouth daily with breakfast. 90 tablet 0   furosemide (LASIX) 40 MG tablet Take 1 tablet (40 mg total) by mouth daily. 90 tablet 1   lactulose (CHRONULAC) 10 GM/15ML solution Take 45 mLs (30 g total) by mouth 3 (three) times daily. 1892 mL 1   pantoprazole (PROTONIX) 40 MG tablet Take 1 tablet (40 mg total) by mouth 2 (two) times daily before a meal.     rifaximin (XIFAXAN) 550 MG TABS tablet Take 1 tablet (550 mg total) by mouth 2 (two) times daily. 60 tablet 1   spironolactone (ALDACTONE) 50 MG tablet Take 1 tablet (50 mg total) by mouth daily. 90 tablet 1   No current facility-administered medications for this visit.    PHYSICAL EXAMINATION: ECOG PERFORMANCE STATUS: 1 - Symptomatic but completely ambulatory  Vitals:   07/27/21 1015  BP: (!) 115/51  Pulse: 63  Resp: 18  Temp: 97.9 F (36.6 C)  SpO2: 97%   There were no vitals filed for this visit.   GENERAL:alert, no distress and comfortable SKIN: skin color, texture, turgor are normal, no rashes or significant lesions EYES: normal, conjunctiva are pink and non-injected, sclera clear OROPHARYNX:no exudate, no erythema and lips, buccal mucosa, and tongue normal  LUNGS: clear to auscultation and percussion with normal breathing effort HEART: regular rate & rhythm and no murmurs , severe pitting  BLE edema. ABDOMEN:abdomen soft, ascitic fluid present Musculoskeletal:no cyanosis of digits and no clubbing  PSYCH: speech is  not fluent. NEURO: no focal motor/sensory deficits  LABORATORY DATA:  I have reviewed the data as listed Lab Results  Component Value Date   WBC 4.5 07/27/2021   HGB 7.7 (L) 07/27/2021   HCT 24.7 (L) 07/27/2021   MCV 89.2 07/27/2021   PLT 90 (L) 07/27/2021     Chemistry      Component Value Date/Time   NA 137 07/25/2021 0748   NA 139 08/25/2020 1420   K 3.1 (L) 07/25/2021 0748   CL 108 07/25/2021 0748   CO2 21 (L) 07/25/2021 0748   BUN 11 07/25/2021 0748   BUN 26 08/25/2020 1420   CREATININE 1.03 07/25/2021 0748   CREATININE 1.13 05/20/2021 1444   CREATININE 0.75 06/09/2015 1609      Component Value Date/Time   CALCIUM 8.2 (L) 07/25/2021 0748   ALKPHOS 152 (H) 07/24/2021 0216   AST 34 07/24/2021 0216   AST 43 (H) 05/20/2021 1444   ALT 21 07/24/2021 0216   ALT 29 05/20/2021 1444   BILITOT 1.0 07/24/2021 0216   BILITOT 0.9 05/20/2021 1444      RADIOGRAPHIC STUDIES: I have personally reviewed the radiological images as listed and agreed with the findings in the report. CT Head Wo Contrast  Result Date: 07/23/2021 CLINICAL DATA:  Mental status change. EXAM: CT HEAD WITHOUT CONTRAST TECHNIQUE: Contiguous axial images were obtained from the base of the skull through the vertex without intravenous contrast. COMPARISON:  Head CT 05/28/2021. FINDINGS: Brain: No evidence of acute infarction, hemorrhage, hydrocephalus, extra-axial collection or mass lesion/mass effect. There is stable mild patchy periventricular and deep white matter hypodensity, likely chronic small vessel ischemic change. This is similar to the prior study. Vascular: Atherosclerotic calcifications are present within the cavernous internal carotid arteries. Skull: Normal. Negative for fracture or focal lesion. Sinuses/Orbits: No acute finding. Other: None. IMPRESSION: No acute  intracranial abnormality. Electronically Signed   By: Ronney Asters M.D.   On: 07/23/2021 18:41    ASSESSMENT & PLAN:  [name] is a 75 y.o. 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  #Pancytopenia 2/2 cirrhosis: --Current plan is to provide supportive care with transfusion support.  --Labs today show Hgb 7.7, thrombocytopenia with Plt 90K.  --Patient would like to hold off on blood transfusions this week as he recently was discharged from the hospital. We will plan to arrange a transfusion next Monday 08/02/2021.  --Most recent EGD and colonoscopy was from 05/02/2021. EGD findings included portal hypertensive gastropathy and, gastric varices, multiple non-bleeding angiectasias in the stomach, duodenal and gastric AVMs. Colonoscopy findings included diverticulosis in the sigmoid colon, colonic angioectasias and hemorrhoids.  --RTC in 1 week with labs and transfusion. RTC in 2 weeks for office visit with Dr. Chryl Heck and labs.    #Hx of hepatocellular carcinoma: --Treated with Y 90 back in November 2021 --Dr. Earleen Newport from IR has ordered an MRI waiting to be scheduled.    Patient and his wife expressed understanding of the plan provided.   I have spent a total of 25 minutes minutes of face-to-face and non-face-to-face time, preparing to see the patient, obtaining and/or reviewing separately obtained history, performing a medically appropriate examination, counseling and educating the patient, ordering procedures,  communicating with other health care professionals, documenting clinical information in the electronic health record, and care coordination.   Lincoln Brigham, PA-C Hematology and Oncology [facility] [place] P: [phone]

## 2021-07-27 NOTE — Telephone Encounter (Signed)
LM for pt that hs has an appt on Monday 08/02/21 at 8 am for lab work and then will get blood at 9 am.

## 2021-08-02 ENCOUNTER — Ambulatory Visit: Payer: Medicare HMO

## 2021-08-02 ENCOUNTER — Telehealth: Payer: Self-pay

## 2021-08-02 ENCOUNTER — Other Ambulatory Visit: Payer: Medicare HMO

## 2021-08-02 ENCOUNTER — Telehealth: Payer: Self-pay | Admitting: *Deleted

## 2021-08-02 DIAGNOSIS — D649 Anemia, unspecified: Secondary | ICD-10-CM | POA: Diagnosis not present

## 2021-08-02 NOTE — Telephone Encounter (Signed)
Pt had an 8 am lab appointment, and 9 am blood infusion appointment. Pt was a no-show, so I called and left a message for the patient to reschedule at his earliest convenience.

## 2021-08-02 NOTE — Telephone Encounter (Signed)
Patient was a no show to his lab and blood transfusion appt scheduled in [facility].  That department tried to reach patient and had to leave message to have patient reschedule.

## 2021-08-03 ENCOUNTER — Other Ambulatory Visit: Payer: Self-pay

## 2021-08-03 ENCOUNTER — Inpatient Hospital Stay: Payer: Medicare HMO

## 2021-08-03 ENCOUNTER — Inpatient Hospital Stay: Payer: Medicare HMO | Attending: Hematology and Oncology

## 2021-08-03 DIAGNOSIS — D61818 Other pancytopenia: Secondary | ICD-10-CM | POA: Diagnosis present

## 2021-08-03 DIAGNOSIS — K7460 Unspecified cirrhosis of liver: Secondary | ICD-10-CM | POA: Diagnosis not present

## 2021-08-03 DIAGNOSIS — Z8505 Personal history of malignant neoplasm of liver: Secondary | ICD-10-CM | POA: Insufficient documentation

## 2021-08-03 DIAGNOSIS — D649 Anemia, unspecified: Secondary | ICD-10-CM

## 2021-08-03 LAB — SAMPLE TO BLOOD BANK

## 2021-08-03 LAB — CBC WITH DIFFERENTIAL (CANCER CENTER ONLY)
Abs Immature Granulocytes: 0.01 10*3/uL (ref 0.00–0.07)
Basophils Absolute: 0 10*3/uL (ref 0.0–0.1)
Basophils Relative: 1 %
Eosinophils Absolute: 0.1 10*3/uL (ref 0.0–0.5)
Eosinophils Relative: 4 %
HCT: 27.7 % — ABNORMAL LOW (ref 39.0–52.0)
Hemoglobin: 8.5 g/dL — ABNORMAL LOW (ref 13.0–17.0)
Immature Granulocytes: 0 %
Lymphocytes Relative: 18 %
Lymphs Abs: 0.6 10*3/uL — ABNORMAL LOW (ref 0.7–4.0)
MCH: 27.4 pg (ref 26.0–34.0)
MCHC: 30.7 g/dL (ref 30.0–36.0)
MCV: 89.4 fL (ref 80.0–100.0)
Monocytes Absolute: 0.5 10*3/uL (ref 0.1–1.0)
Monocytes Relative: 14 %
Neutro Abs: 2.1 10*3/uL (ref 1.7–7.7)
Neutrophils Relative %: 63 %
Platelet Count: 126 10*3/uL — ABNORMAL LOW (ref 150–400)
RBC: 3.1 MIL/uL — ABNORMAL LOW (ref 4.22–5.81)
RDW: 18.3 % — ABNORMAL HIGH (ref 11.5–15.5)
WBC Count: 3.3 10*3/uL — ABNORMAL LOW (ref 4.0–10.5)
nRBC: 0 % (ref 0.0–0.2)

## 2021-08-03 NOTE — Progress Notes (Signed)
Pt's hgb was 8.5. Per Lyanne Co the patient does not need a blood transfusion today. This was explained to the pt and he had no questions or concerns.

## 2021-08-12 ENCOUNTER — Inpatient Hospital Stay (HOSPITAL_BASED_OUTPATIENT_CLINIC_OR_DEPARTMENT_OTHER): Payer: Medicare HMO | Admitting: Hematology and Oncology

## 2021-08-12 ENCOUNTER — Encounter: Payer: Self-pay | Admitting: Hematology and Oncology

## 2021-08-12 ENCOUNTER — Inpatient Hospital Stay: Payer: Medicare HMO

## 2021-08-12 ENCOUNTER — Other Ambulatory Visit: Payer: Self-pay

## 2021-08-12 VITALS — BP 155/71 | HR 72 | Temp 97.9°F | Resp 17

## 2021-08-12 DIAGNOSIS — D696 Thrombocytopenia, unspecified: Secondary | ICD-10-CM | POA: Diagnosis not present

## 2021-08-12 DIAGNOSIS — K7460 Unspecified cirrhosis of liver: Secondary | ICD-10-CM

## 2021-08-12 DIAGNOSIS — D61818 Other pancytopenia: Secondary | ICD-10-CM

## 2021-08-12 DIAGNOSIS — R188 Other ascites: Secondary | ICD-10-CM

## 2021-08-12 DIAGNOSIS — Z8505 Personal history of malignant neoplasm of liver: Secondary | ICD-10-CM | POA: Diagnosis not present

## 2021-08-12 LAB — CBC WITH DIFFERENTIAL/PLATELET
Abs Immature Granulocytes: 0.01 10*3/uL (ref 0.00–0.07)
Basophils Absolute: 0 10*3/uL (ref 0.0–0.1)
Basophils Relative: 1 %
Eosinophils Absolute: 0.1 10*3/uL (ref 0.0–0.5)
Eosinophils Relative: 3 %
HCT: 29.1 % — ABNORMAL LOW (ref 39.0–52.0)
Hemoglobin: 9 g/dL — ABNORMAL LOW (ref 13.0–17.0)
Immature Granulocytes: 0 %
Lymphocytes Relative: 20 %
Lymphs Abs: 0.7 10*3/uL (ref 0.7–4.0)
MCH: 27.8 pg (ref 26.0–34.0)
MCHC: 30.9 g/dL (ref 30.0–36.0)
MCV: 89.8 fL (ref 80.0–100.0)
Monocytes Absolute: 0.3 10*3/uL (ref 0.1–1.0)
Monocytes Relative: 8 %
Neutro Abs: 2.3 10*3/uL (ref 1.7–7.7)
Neutrophils Relative %: 68 %
Platelets: 148 10*3/uL — ABNORMAL LOW (ref 150–400)
RBC: 3.24 MIL/uL — ABNORMAL LOW (ref 4.22–5.81)
RDW: 18.8 % — ABNORMAL HIGH (ref 11.5–15.5)
WBC: 3.4 10*3/uL — ABNORMAL LOW (ref 4.0–10.5)
nRBC: 0 % (ref 0.0–0.2)

## 2021-08-12 NOTE — Progress Notes (Signed)
[place]  PROGRESS NOTE  Patient Care Team: [name], MD as PCP - General (Internal Medicine) Nahser, Wonda Cheng, MD as PCP - Cardiology (Cardiology) Clance, Armando Reichert, MD as Consulting Physician (Pulmonary Disease) Greer Pickerel, MD as Consulting Physician (General Surgery) Wonda Horner, MD (Gastroenterology) Rana Snare, MD (Inactive) (Urology) Hayden Pedro, MD (Ophthalmology)  CHIEF COMPLAINTS/PURPOSE OF CONSULTATION:  Pancytopenia  HISTORY OF PRESENTING ILLNESS:   [name] 75 y.o. male is here because of pancytopenia from liver cirrhosis.  Patient is here for follow-up with his son.  He is in a wheelchair.  Since last visit he denies any new hospitalizations, hematochezia or melena.  He has not required blood transfusion in about a month or so.  He did not have the MRI abdomen as recommended yet.  Apparently he had to be in the hospital but it was scheduled and he tried to reschedule it but couldn't. Ongoing bilateral severe lower extremity edema.  Patient denies any unusual shortness of breath or falls today.  No bleeding reported.  Rest of the pertinent 10 point ROS reviewed and negative.  MEDICAL HISTORY:  Past Medical History:  Diagnosis Date   Arthritis    "joints tighten up sometimes" (03/27/2104)   Carcinoma of liver, hepatocellular ([place]) 06/30/2020   Chronic lower back pain    Coronary artery disease involving native coronary artery of native heart without angina pectoris    Severe left main disease at catheterization July 2015  CABG x3 with a LIMA to the LAD, SVG to the OM, SVG to the PDA on 03/31/14. EF 60% by cath.    Diastolic heart failure (HCC)    GERD without esophagitis 08/04/2010   Hepatic cirrhosis ([place])    a. Dx 01/2014 - CT a/p    History of blood transfusion    "related to bleeding ulcers"   History of concussion    1976--  NO RESIDUAL   History of GI bleed    a. UGIB 07/2012;  b. 01/2014 admission with GIB/FOB stool  req 1U prbc's->EGD showed portal gastropathy, barrett's esoph, and chronic active h. pylori gastritis.   History of gout    2007 &  2008  LEFT LEG-- NO ISSUE SINCE   Hyperlipidemia    Iron deficiency anemia    Kidney stones    OA (osteoarthritis of spine)    LOWER BACK--  INTERMITTANT LEFT LEG NUMBNESS   OSA (obstructive sleep apnea)    PULMOLOGIST-  DR CLANCE--  MODERATE OSA  STARTED CPAP 2012--  BUT CURRENTLY HAS NOT USED PAST 6 MONTHS   Phimosis    a. s/p circumcision 2015.   Type 2 diabetes mellitus ([place])    Unspecified essential hypertension     SURGICAL HISTORY: Past Surgical History:  Procedure Laterality Date   ANKLE FRACTURE SURGERY Right 1989   "plate put in"   APPENDECTOMY  05-16-2004   open   BIOPSY  01/08/2021   Procedure: BIOPSY;  Surgeon: Wilford Corner, MD;  Location: [facility];  Service: Endoscopy;;   BIOPSY  05/02/2021   Procedure: BIOPSY;  Surgeon: Arta Silence, MD;  Location: [facility];  Service: Endoscopy;;   CIRCUMCISION N/A 09/09/2013   Procedure: CIRCUMCISION ADULT;  Surgeon: Bernestine Amass, MD;  Location[facility];  Service: Urology;  Laterality: N/A;   COLECTOMY  05-16-2004   COLONOSCOPY N/A 05/02/2021   Procedure: COLONOSCOPY;  Surgeon: Arta Silence, MD;  [facility];  Service: Endoscopy;  Laterality: N/A;  COLONOSCOPY WITH PROPOFOL N/A 11/20/2020   Procedure: COLONOSCOPY WITH PROPOFOL;  Surgeon: Ronald Lobo, MD;  [facility];  Service: Endoscopy;  Laterality: N/A;   CORONARY ANGIOPLASTY WITH STENT PLACEMENT  06/28/2012  DR COOPER   PCI W/  X1 DES to [place]. LAD/  LM  40% OSTIAL & 50-60% DISTAL /  50% PROX LCX/  30-40% PROX RCA & 50% MID RCA/   LVEF 65-70%   CORONARY ARTERY BYPASS GRAFT N/A 03/31/2014   Procedure: CORONARY ARTERY BYPASS GRAFTING (CABG) times 3 using left internal mammary artery and right saphenous vein.;  Surgeon: Melrose Nakayama, MD;  Location: [place];  Service: Open Heart Surgery;   Laterality: N/A;   DOBUTAMINE STRESS ECHO  06-08-2012   MODERATE HYPOKINESIS/ ISCHEMIA MID INFERIOR WALL   ESOPHAGOGASTRODUODENOSCOPY  08/15/2012   Procedure: ESOPHAGOGASTRODUODENOSCOPY (EGD);  Surgeon: Wonda Horner, MD;  Location: [facility] ENDOSCOPY;  Service: Endoscopy;  Laterality: N/A;   ESOPHAGOGASTRODUODENOSCOPY N/A 02/17/2014   Procedure: ESOPHAGOGASTRODUODENOSCOPY (EGD);  Surgeon: Jeryl Columbia, MD;  Location: [facility] ENDOSCOPY;  Service: Endoscopy;  Laterality: N/A;   ESOPHAGOGASTRODUODENOSCOPY N/A 04/17/2020   Procedure: ESOPHAGOGASTRODUODENOSCOPY (EGD);  Surgeon: Ronnette Juniper, MD;  Location: [place];  Service: Gastroenterology;  Laterality: N/A;   ESOPHAGOGASTRODUODENOSCOPY N/A 09/11/2020   Procedure: ESOPHAGOGASTRODUODENOSCOPY (EGD);  Surgeon: Clarene Essex, MD;  Location: [facility];  Service: Endoscopy;  Laterality: N/A;   ESOPHAGOGASTRODUODENOSCOPY  10/09/2020   ESOPHAGOGASTRODUODENOSCOPY N/A 10/09/2020   Procedure: ESOPHAGOGASTRODUODENOSCOPY (EGD);  Surgeon: Otis Brace, MD;  Location: [facility]COPY;  Service: Gastroenterology;  Laterality: N/A;   ESOPHAGOGASTRODUODENOSCOPY N/A 01/08/2021   Procedure: ESOPHAGOGASTRODUODENOSCOPY (EGD);  Surgeon: Wilford Corner, MD;  Location: [facility];  Service: Endoscopy;  Laterality: N/A;   ESOPHAGOGASTRODUODENOSCOPY N/A 05/02/2021   Procedure: ESOPHAGOGASTRODUODENOSCOPY (EGD);  Surgeon: Arta Silence, MD;  Location: [facility];  Service: Endoscopy;  Laterality: N/A;   ESOPHAGOGASTRODUODENOSCOPY (EGD) WITH PROPOFOL N/A 09/30/2017   Procedure: ESOPHAGOGASTRODUODENOSCOPY (EGD) WITH PROPOFOL;  Surgeon: Wonda Horner, MD;  Location: [facility] ENDOSCOPY;  Service: Endoscopy;  Laterality: N/A;   ESOPHAGOGASTRODUODENOSCOPY (EGD) WITH PROPOFOL N/A 12/20/2017   Procedure: ESOPHAGOGASTRODUODENOSCOPY (EGD) WITH PROPOFOL;  Surgeon: Clarene Essex, MD;  Location: [place];  Service: Endoscopy;  Laterality: N/A;   ESOPHAGOGASTRODUODENOSCOPY (EGD) WITH PROPOFOL N/A  08/10/2020   Procedure: ESOPHAGOGASTRODUODENOSCOPY (EGD) WITH PROPOFOL;  Surgeon: Wilford Corner, MD;  Location: [place];  Service: Endoscopy;  Laterality: N/A;   ESOPHAGOGASTRODUODENOSCOPY (EGD) WITH PROPOFOL N/A 11/20/2020   Procedure: ESOPHAGOGASTRODUODENOSCOPY (EGD) WITH PROPOFOL;  Surgeon: Ronald Lobo, MD;  [facility];  Service: Endoscopy;  Laterality: N/A;   ESOPHAGOGASTRODUODENOSCOPY (EGD) WITH PROPOFOL N/A 02/28/2021   Procedure: ESOPHAGOGASTRODUODENOSCOPY (EGD) WITH PROPOFOL;  Surgeon: Clarene Essex, MD;  [facility];  Service: Endoscopy;  Laterality: N/A;   GIVENS CAPSULE STUDY N/A 11/20/2020   Procedure: GIVENS CAPSULE STUDY;  Surgeon: Ronald Lobo, MD;  [facility];  Service: Endoscopy;  Laterality: N/A;   HEMOSTASIS CLIP PLACEMENT  05/02/2021   Procedure: HEMOSTASIS CLIP PLACEMENT;  Surgeon: Arta Silence, MD;  [facility];  Service: Endoscopy;;   HERNIA REPAIR     INTRAOPERATIVE TRANSESOPHAGEAL ECHOCARDIOGRAM N/A 03/31/2014   Procedure: INTRAOPERATIVE TRANSESOPHAGEAL ECHOCARDIOGRAM;  Surgeon: Melrose Nakayama, MD;  Location: [place];  Service: Open Heart Surgery;  Laterality: N/A;   IR ANGIOGRAM SELECTIVE EACH ADDITIONAL VESSEL  06/16/2020   IR ANGIOGRAM SELECTIVE EACH ADDITIONAL VESSEL  06/16/2020   IR ANGIOGRAM SELECTIVE EACH ADDITIONAL VESSEL  06/16/2020   IR ANGIOGRAM SELECTIVE EACH ADDITIONAL VESSEL  06/16/2020  IR ANGIOGRAM SELECTIVE EACH ADDITIONAL VESSEL  06/16/2020   IR ANGIOGRAM SELECTIVE EACH ADDITIONAL VESSEL  06/16/2020   IR ANGIOGRAM SELECTIVE EACH ADDITIONAL VESSEL  06/16/2020   IR ANGIOGRAM SELECTIVE EACH ADDITIONAL VESSEL  06/16/2020   IR ANGIOGRAM SELECTIVE EACH ADDITIONAL VESSEL  06/16/2020   IR ANGIOGRAM SELECTIVE EACH ADDITIONAL VESSEL  06/30/2020   IR ANGIOGRAM SELECTIVE EACH ADDITIONAL VESSEL  06/30/2020   IR ANGIOGRAM VISCERAL SELECTIVE  06/16/2020   IR ANGIOGRAM VISCERAL SELECTIVE  06/30/2020   IR  EMBO ARTERIAL NOT HEMORR HEMANG INC GUIDE ROADMAPPING  06/16/2020   IR EMBO TUMOR ORGAN ISCHEMIA INFARCT INC GUIDE ROADMAPPING  06/30/2020   IR EMBO VENOUS NOT HEMORR HEMANG  INC GUIDE ROADMAPPING  01/13/2021   IR IVUS EACH ADDITIONAL NON CORONARY VESSEL  01/13/2021   IR RADIOLOGIST EVAL & MGMT  05/06/2020   IR RADIOLOGIST EVAL & MGMT  05/20/2020   IR RADIOLOGIST EVAL & MGMT  05/25/2021   IR TIPS  01/13/2021   IR US GUIDE VASC ACCESS RIGHT  06/16/2020   IR US GUIDE VASC ACCESS RIGHT  06/30/2020   IR US GUIDE VASC ACCESS RIGHT  01/13/2021   LAPAROSCOPIC UMBILICAL HERNIA REPAIR W/ MESH  06-06-2011   LEFT HEART CATHETERIZATION WITH CORONARY ANGIOGRAM N/A 03/28/2014   Procedure: LEFT HEART CATHETERIZATION WITH CORONARY ANGIOGRAM;  Surgeon: Sinclair Grooms, MD;  Location: [facility] CATH LAB;  Service: Cardiovascular;  Laterality: N/A;   LIPOMA EXCISION Left 08/25/2017   Procedure: EXCISION LIPOMA LEFT POSTERIOR THIGH;  Surgeon: Clovis Riley, MD;  Location: [place];  Service: General;  Laterality: Left;   NEPHROLITHOTOMY  1990'S   OPEN APPENDECTOMY W/ PARTIAL CECECTOMY  05-16-2004   PERCUTANEOUS CORONARY STENT INTERVENTION (PCI-S) N/A 06/28/2012   Procedure: PERCUTANEOUS CORONARY STENT INTERVENTION (PCI-S);  Surgeon: Sherren Mocha, MD;  Location: [facility] CATH LAB;  Service: Cardiovascular;  Laterality: N/A;   POLYPECTOMY  11/20/2020   Procedure: POLYPECTOMY;  Surgeon: Ronald Lobo, MD;  [facility];  Service: Endoscopy;;   POLYPECTOMY  05/02/2021   Procedure: POLYPECTOMY;  Surgeon: Arta Silence, MD;  [facility];  Service: Endoscopy;;   RADIOLOGY WITH ANESTHESIA N/A 01/13/2021   Procedure: IR WITH ANESTHESIA - TIPS;  Surgeon: Suzette Battiest, MD;  Location: [place];  Service: Radiology;  Laterality: N/A;    SOCIAL HISTORY: Social History   Socioeconomic History   Marital status: Married    Spouse name: Not on file   Number of children: Not on file   Years of education: Not on file    Highest education level: Not on file  Occupational History   Not on file  Tobacco Use   Smoking status: Former    Packs/day: 2.00    Years: 40.00    Pack years: 80.00    Types: Cigarettes    Quit date: 08/29/1996    Years since quitting: 24.9   Smokeless tobacco: Never  Vaping Use   Vaping Use: Never used  Substance and Sexual Activity   Alcohol use: Not Currently    Alcohol/week: 8.0 standard drinks    Types: 8 Cans of beer per week    Comment: 2 beers every other day   Drug use: No   Sexual activity: Yes  Other Topics Concern   Not on file  Social History Narrative   Lives in [place] with wife, son, and son's family.   Social Determinants of Health   Financial Resource Strain: Medium Risk   Difficulty of Paying Living Expenses:  Somewhat hard  Food Insecurity: No Food Insecurity   Worried About Charity fundraiser in the Last Year: Never true   Ran Out of Food in the Last Year: Never true  Transportation Needs: No Transportation Needs   Lack of Transportation (Medical): No   Lack of Transportation (Non-Medical): No  Physical Activity: Not on file  Stress: Not on file  Social Connections: Not on file  Intimate Partner Violence: Not on file    FAMILY HISTORY: Family History  Problem Relation Age of Onset   Lung cancer Sister    Cancer Sister        lung   Cancer Mother    Cancer Father        died in his 3s.   Cancer Brother        lung   Coronary artery disease Other    Diabetes Other    Colon cancer Other    Cancer Sister        lung    ALLERGIES:  is allergic to fluzone quadrivalent [influenza vac split quad].  MEDICATIONS:  Current Outpatient Medications  Medication Sig Dispense Refill   Accu-Chek Softclix Lancets lancets USE AS DIRECTED TO TEST BLOOD SUGAR FOUR TIMES DAILY 300 each 3   albuterol (VENTOLIN HFA) 108 (90 Base) MCG/ACT inhaler Inhale 2 puffs into the lungs every 6 (six) hours as needed for wheezing or shortness of breath. 8 g 0   ANORO  ELLIPTA 62.5-25 MCG/ACT AEPB Inhale 1 puff into the lungs daily. 1 each 1   blood glucose meter kit and supplies Dispense based on patient and insurance preference. Use up to four times daily as directed. (FOR ICD-10 E10.9, E11.9). 1 each 0   ferrous sulfate 325 (65 FE) MG tablet Take 1 tablet (325 mg total) by mouth daily with breakfast. 90 tablet 0   furosemide (LASIX) 40 MG tablet Take 1 tablet (40 mg total) by mouth daily. 90 tablet 1   lactulose (CHRONULAC) 10 GM/15ML solution Take 45 mLs (30 g total) by mouth 3 (three) times daily. 1892 mL 1   pantoprazole (PROTONIX) 40 MG tablet Take 1 tablet (40 mg total) by mouth 2 (two) times daily before a meal.     rifaximin (XIFAXAN) 550 MG TABS tablet Take 1 tablet (550 mg total) by mouth 2 (two) times daily. 60 tablet 1   spironolactone (ALDACTONE) 50 MG tablet Take 1 tablet (50 mg total) by mouth daily. 90 tablet 1   No current facility-administered medications for this visit.    PHYSICAL EXAMINATION: ECOG PERFORMANCE STATUS: 1 - Symptomatic but completely ambulatory  Vitals:   08/12/21 1444  BP: (!) 155/71  Pulse: 72  Resp: 17  Temp: 97.9 F (36.6 C)  SpO2: 99%   Filed Weights   GENERAL:alert, no distress and comfortable SKIN: skin color, texture, turgor are normal, no rashes or significant lesions EYES: normal, conjunctiva are pink and non-injected, sclera clear OROPHARYNX:no exudate, no erythema and lips, buccal mucosa, and tongue normal  LUNGS: clear to auscultation and percussion with normal breathing effort HEART: regular rate & rhythm and no murmurs , severe pitting BLE edema. ABDOMEN:abdomen soft, ascitic fluid present Musculoskeletal:no cyanosis of digits and no clubbing  PSYCH: speech is not fluent. NEURO: no focal motor/sensory deficits  LABORATORY DATA:  I have reviewed the data as listed Lab Results  Component Value Date   WBC 3.4 (L) 08/12/2021   HGB 9.0 (L) 08/12/2021   HCT 29.1 (L) 08/12/2021   MCV  89.8  08/12/2021   PLT 148 (L) 08/12/2021     Chemistry      Component Value Date/Time   NA 137 07/25/2021 0748   NA 139 08/25/2020 1420   K 3.1 (L) 07/25/2021 0748   CL 108 07/25/2021 0748   CO2 21 (L) 07/25/2021 0748   BUN 11 07/25/2021 0748   BUN 26 08/25/2020 1420   CREATININE 1.03 07/25/2021 0748   CREATININE 1.13 05/20/2021 1444   CREATININE 0.75 06/09/2015 1609      Component Value Date/Time   CALCIUM 8.2 (L) 07/25/2021 0748   ALKPHOS 152 (H) 07/24/2021 0216   AST 34 07/24/2021 0216   AST 43 (H) 05/20/2021 1444   ALT 21 07/24/2021 0216   ALT 29 05/20/2021 1444   BILITOT 1.0 07/24/2021 0216   BILITOT 0.9 05/20/2021 1444      RADIOGRAPHIC STUDIES: I have personally reviewed the radiological images as listed and agreed with the findings in the report. CT Head Wo Contrast  Result Date: 07/23/2021 CLINICAL DATA:  Mental status change. EXAM: CT HEAD WITHOUT CONTRAST TECHNIQUE: Contiguous axial images were obtained from the base of the skull through the vertex without intravenous contrast. COMPARISON:  Head CT 05/28/2021. FINDINGS: Brain: No evidence of acute infarction, hemorrhage, hydrocephalus, extra-axial collection or mass lesion/mass effect. There is stable mild patchy periventricular and deep white matter hypodensity, likely chronic small vessel ischemic change. This is similar to the prior study. Vascular: Atherosclerotic calcifications are present within the cavernous internal carotid arteries. Skull: Normal. Negative for fracture or focal lesion. Sinuses/Orbits: No acute finding. Other: None. IMPRESSION: No acute intracranial abnormality. Electronically Signed   By: Ronney Asters M.D.   On: 07/23/2021 18:41    ASSESSMENT & PLAN:   Dinari YOUNGBERG is a 75 y.o. 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  #Pancytopenia 2/2 cirrhosis:  --Current plan is to provide supportive care with transfusion support.  --Labs today show Hgb of 9 , WBC of 3.4  and platelets of 148K --No indication for transfusion today. --Most recent EGD and colonoscopy was from 05/02/2021. EGD findings included portal hypertensive gastropathy and, gastric varices, multiple non-bleeding angiectasias in the stomach, duodenal and gastric AVMs. Colonoscopy findings included diverticulosis in the sigmoid colon, colonic angioectasias and hemorrhoids.  --RTC in 2 weeks for labs and FU.  We have once again discussed the reason for pancytopenia in this patient with liver disease and the need for periodic transfusions and supportive care.   #Hx of hepatocellular carcinoma: --Treated with Y 90 back in November 2021 --I have once again stressed on the importance of rescheduling the MRI abdomen, phone number to call back radiology was written and given to the patient's son.  He expressed understanding and will call and schedule the MRI abdomen. He will continue to return to hematology on a periodic basis.  He can follow-up with [place] every 2 weeks and as needed transfusion.  Have spent 20 minutes in the care of this patient including history, review of records, counseling and coordination of care as mentioned above.   Benay Pike, MD Hematology and Oncology [facility] [place] P: [phone]

## 2021-08-27 ENCOUNTER — Other Ambulatory Visit: Payer: Medicare HMO

## 2021-08-27 ENCOUNTER — Ambulatory Visit: Payer: Medicare HMO | Admitting: Physician Assistant

## 2021-09-06 ENCOUNTER — Other Ambulatory Visit: Payer: Self-pay | Admitting: Interventional Radiology

## 2021-09-06 ENCOUNTER — Other Ambulatory Visit: Payer: Self-pay | Admitting: *Deleted

## 2021-09-06 DIAGNOSIS — K7031 Alcoholic cirrhosis of liver with ascites: Secondary | ICD-10-CM

## 2021-09-06 DIAGNOSIS — C220 Liver cell carcinoma: Secondary | ICD-10-CM

## 2021-09-06 DIAGNOSIS — D649 Anemia, unspecified: Secondary | ICD-10-CM

## 2021-09-07 ENCOUNTER — Telehealth: Payer: Self-pay

## 2021-09-07 MED ORDER — LACTULOSE 10 GM/15ML PO SOLN: 30.0000 g | Freq: Three times a day (TID) | ORAL | 1 refills | Status: DC

## 2021-09-07 NOTE — Telephone Encounter (Signed)
Refill has been sent to the pharmacy.  

## 2021-09-07 NOTE — Telephone Encounter (Signed)
Ok to refill 

## 2021-09-07 NOTE — Telephone Encounter (Signed)
Pt son calling to requesting refill on: lactulose (CHRONULAC) 10 GM/15ML solution  LOV 08/12/21 Pharmacy [facility] [place] [facility] [employer]

## 2021-09-07 NOTE — Telephone Encounter (Signed)
Okay to refill? 

## 2021-09-08 ENCOUNTER — Emergency Department (HOSPITAL_COMMUNITY): Payer: Medicare HMO

## 2021-09-08 ENCOUNTER — Inpatient Hospital Stay (HOSPITAL_COMMUNITY)
Admission: EM | Admit: 2021-09-08 | Discharge: 2021-09-12 | DRG: 442 | Disposition: A | Discharge status: Home Health | Payer: Medicare HMO | Attending: Internal Medicine | Admitting: Internal Medicine

## 2021-09-08 ENCOUNTER — Encounter (HOSPITAL_COMMUNITY): Payer: Self-pay | Admitting: Internal Medicine

## 2021-09-08 DIAGNOSIS — Z9049 Acquired absence of other specified parts of digestive tract: Secondary | ICD-10-CM

## 2021-09-08 DIAGNOSIS — D62 Acute posthemorrhagic anemia: Secondary | ICD-10-CM | POA: Diagnosis present

## 2021-09-08 DIAGNOSIS — Z743 Need for continuous supervision: Secondary | ICD-10-CM | POA: Diagnosis not present

## 2021-09-08 DIAGNOSIS — R918 Other nonspecific abnormal finding of lung field: Secondary | ICD-10-CM

## 2021-09-08 DIAGNOSIS — Z6831 Body mass index (BMI) 31.0-31.9, adult: Secondary | ICD-10-CM

## 2021-09-08 DIAGNOSIS — Z87820 Personal history of traumatic brain injury: Secondary | ICD-10-CM

## 2021-09-08 DIAGNOSIS — M545 Low back pain, unspecified: Secondary | ICD-10-CM | POA: Diagnosis present

## 2021-09-08 DIAGNOSIS — K7460 Unspecified cirrhosis of liver: Secondary | ICD-10-CM | POA: Diagnosis present

## 2021-09-08 DIAGNOSIS — E872 Acidosis, unspecified: Secondary | ICD-10-CM | POA: Diagnosis present

## 2021-09-08 DIAGNOSIS — E669 Obesity, unspecified: Secondary | ICD-10-CM | POA: Diagnosis present

## 2021-09-08 DIAGNOSIS — K922 Gastrointestinal hemorrhage, unspecified: Secondary | ICD-10-CM | POA: Diagnosis not present

## 2021-09-08 DIAGNOSIS — I959 Hypotension, unspecified: Secondary | ICD-10-CM | POA: Diagnosis not present

## 2021-09-08 DIAGNOSIS — I5032 Chronic diastolic (congestive) heart failure: Secondary | ICD-10-CM | POA: Diagnosis not present

## 2021-09-08 DIAGNOSIS — Z8719 Personal history of other diseases of the digestive system: Secondary | ICD-10-CM

## 2021-09-08 DIAGNOSIS — K7682 Hepatic encephalopathy: Secondary | ICD-10-CM | POA: Diagnosis not present

## 2021-09-08 DIAGNOSIS — Z8711 Personal history of peptic ulcer disease: Secondary | ICD-10-CM

## 2021-09-08 DIAGNOSIS — Z7401 Bed confinement status: Secondary | ICD-10-CM

## 2021-09-08 DIAGNOSIS — E876 Hypokalemia: Secondary | ICD-10-CM | POA: Diagnosis not present

## 2021-09-08 DIAGNOSIS — I110 Hypertensive heart disease with heart failure: Secondary | ICD-10-CM | POA: Diagnosis present

## 2021-09-08 DIAGNOSIS — R531 Weakness: Secondary | ICD-10-CM

## 2021-09-08 DIAGNOSIS — R4182 Altered mental status, unspecified: Secondary | ICD-10-CM | POA: Diagnosis not present

## 2021-09-08 DIAGNOSIS — R410 Disorientation, unspecified: Secondary | ICD-10-CM | POA: Diagnosis not present

## 2021-09-08 DIAGNOSIS — E860 Dehydration: Secondary | ICD-10-CM | POA: Diagnosis present

## 2021-09-08 DIAGNOSIS — Z833 Family history of diabetes mellitus: Secondary | ICD-10-CM

## 2021-09-08 DIAGNOSIS — Z8249 Family history of ischemic heart disease and other diseases of the circulatory system: Secondary | ICD-10-CM

## 2021-09-08 DIAGNOSIS — D649 Anemia, unspecified: Secondary | ICD-10-CM | POA: Diagnosis not present

## 2021-09-08 DIAGNOSIS — G8929 Other chronic pain: Secondary | ICD-10-CM | POA: Diagnosis present

## 2021-09-08 DIAGNOSIS — D61818 Other pancytopenia: Secondary | ICD-10-CM | POA: Diagnosis present

## 2021-09-08 DIAGNOSIS — K766 Portal hypertension: Secondary | ICD-10-CM | POA: Diagnosis present

## 2021-09-08 DIAGNOSIS — Z66 Do not resuscitate: Secondary | ICD-10-CM | POA: Diagnosis not present

## 2021-09-08 DIAGNOSIS — E119 Type 2 diabetes mellitus without complications: Secondary | ICD-10-CM | POA: Diagnosis present

## 2021-09-08 DIAGNOSIS — Z800 Family history of malignant neoplasm of digestive organs: Secondary | ICD-10-CM

## 2021-09-08 DIAGNOSIS — Z9889 Other specified postprocedural states: Secondary | ICD-10-CM

## 2021-09-08 DIAGNOSIS — R911 Solitary pulmonary nodule: Secondary | ICD-10-CM | POA: Diagnosis present

## 2021-09-08 DIAGNOSIS — N179 Acute kidney failure, unspecified: Secondary | ICD-10-CM | POA: Diagnosis present

## 2021-09-08 DIAGNOSIS — R160 Hepatomegaly, not elsewhere classified: Secondary | ICD-10-CM | POA: Diagnosis present

## 2021-09-08 DIAGNOSIS — Z8505 Personal history of malignant neoplasm of liver: Secondary | ICD-10-CM

## 2021-09-08 DIAGNOSIS — Z20822 Contact with and (suspected) exposure to covid-19: Secondary | ICD-10-CM | POA: Diagnosis present

## 2021-09-08 DIAGNOSIS — Z887 Allergy status to serum and vaccine status: Secondary | ICD-10-CM

## 2021-09-08 DIAGNOSIS — I2510 Atherosclerotic heart disease of native coronary artery without angina pectoris: Secondary | ICD-10-CM | POA: Diagnosis present

## 2021-09-08 DIAGNOSIS — J910 Malignant pleural effusion: Secondary | ICD-10-CM | POA: Diagnosis present

## 2021-09-08 DIAGNOSIS — Z87442 Personal history of urinary calculi: Secondary | ICD-10-CM

## 2021-09-08 DIAGNOSIS — Z951 Presence of aortocoronary bypass graft: Secondary | ICD-10-CM

## 2021-09-08 DIAGNOSIS — K219 Gastro-esophageal reflux disease without esophagitis: Secondary | ICD-10-CM | POA: Diagnosis not present

## 2021-09-08 DIAGNOSIS — Z87891 Personal history of nicotine dependence: Secondary | ICD-10-CM

## 2021-09-08 DIAGNOSIS — K7469 Other cirrhosis of liver: Secondary | ICD-10-CM | POA: Diagnosis present

## 2021-09-08 DIAGNOSIS — Z923 Personal history of irradiation: Secondary | ICD-10-CM

## 2021-09-08 DIAGNOSIS — Z801 Family history of malignant neoplasm of trachea, bronchus and lung: Secondary | ICD-10-CM

## 2021-09-08 DIAGNOSIS — E7220 Disorder of urea cycle metabolism, unspecified: Secondary | ICD-10-CM | POA: Diagnosis present

## 2021-09-08 DIAGNOSIS — D696 Thrombocytopenia, unspecified: Secondary | ICD-10-CM

## 2021-09-08 DIAGNOSIS — I517 Cardiomegaly: Secondary | ICD-10-CM | POA: Diagnosis not present

## 2021-09-08 DIAGNOSIS — Z7951 Long term (current) use of inhaled steroids: Secondary | ICD-10-CM

## 2021-09-08 DIAGNOSIS — R0689 Other abnormalities of breathing: Secondary | ICD-10-CM | POA: Diagnosis not present

## 2021-09-08 DIAGNOSIS — Z955 Presence of coronary angioplasty implant and graft: Secondary | ICD-10-CM

## 2021-09-08 DIAGNOSIS — R9431 Abnormal electrocardiogram [ECG] [EKG]: Secondary | ICD-10-CM | POA: Diagnosis not present

## 2021-09-08 DIAGNOSIS — Z79899 Other long term (current) drug therapy: Secondary | ICD-10-CM

## 2021-09-08 DIAGNOSIS — Z7189 Other specified counseling: Secondary | ICD-10-CM

## 2021-09-08 LAB — CBC WITH DIFFERENTIAL/PLATELET
Abs Immature Granulocytes: 0.01 10*3/uL (ref 0.00–0.07)
Basophils Absolute: 0 10*3/uL (ref 0.0–0.1)
Basophils Relative: 0 %
Eosinophils Absolute: 0 10*3/uL (ref 0.0–0.5)
Eosinophils Relative: 1 %
HCT: 17.2 % — ABNORMAL LOW (ref 39.0–52.0)
Hemoglobin: 5.2 g/dL — CL (ref 13.0–17.0)
Immature Granulocytes: 0 %
Lymphocytes Relative: 14 %
Lymphs Abs: 0.4 10*3/uL — ABNORMAL LOW (ref 0.7–4.0)
MCH: 25.9 pg — ABNORMAL LOW (ref 26.0–34.0)
MCHC: 30.2 g/dL (ref 30.0–36.0)
MCV: 85.6 fL (ref 80.0–100.0)
Monocytes Absolute: 0.2 10*3/uL (ref 0.1–1.0)
Monocytes Relative: 8 %
Neutro Abs: 2.1 10*3/uL (ref 1.7–7.7)
Neutrophils Relative %: 77 %
Platelets: 98 10*3/uL — ABNORMAL LOW (ref 150–400)
RBC: 2.01 MIL/uL — ABNORMAL LOW (ref 4.22–5.81)
RDW: 16.4 % — ABNORMAL HIGH (ref 11.5–15.5)
WBC: 2.7 10*3/uL — ABNORMAL LOW (ref 4.0–10.5)
nRBC: 0 % (ref 0.0–0.2)

## 2021-09-08 LAB — LACTIC ACID, PLASMA
Lactic Acid, Venous: 2.7 mmol/L (ref 0.5–1.9)
Lactic Acid, Venous: 2 mmol/L (ref 0.5–1.9)

## 2021-09-08 LAB — COMPREHENSIVE METABOLIC PANEL
ALT: 22 U/L (ref 0–44)
AST: 42 U/L — ABNORMAL HIGH (ref 15–41)
Albumin: 2.3 g/dL — ABNORMAL LOW (ref 3.5–5.0)
Alkaline Phosphatase: 115 U/L (ref 38–126)
Anion gap: 12 (ref 5–15)
BUN: 21 mg/dL (ref 8–23)
CO2: 17 mmol/L — ABNORMAL LOW (ref 22–32)
Calcium: 8.1 mg/dL — ABNORMAL LOW (ref 8.9–10.3)
Chloride: 106 mmol/L (ref 98–111)
Creatinine, Ser: 1.17 mg/dL (ref 0.61–1.24)
GFR, Estimated: >60 mL/min (ref >60)
Glucose, Bld: 129 mg/dL — ABNORMAL HIGH (ref 70–99)
Potassium: 3.8 mmol/L (ref 3.5–5.1)
Sodium: 135 mmol/L (ref 135–145)
Total Bilirubin: 1.2 mg/dL (ref 0.3–1.2)
Total Protein: 5.3 g/dL — ABNORMAL LOW (ref 6.5–8.1)

## 2021-09-08 LAB — RESP PANEL BY RT-PCR (FLU A&B, COVID) ARPGX2
Influenza A by PCR: NEGATIVE
Influenza B by PCR: NEGATIVE
SARS Coronavirus 2 by RT PCR: NEGATIVE

## 2021-09-08 LAB — POC OCCULT BLOOD, ED: Fecal Occult Bld: POSITIVE — AB

## 2021-09-08 LAB — PREPARE RBC (CROSSMATCH)

## 2021-09-08 LAB — ETHANOL: Alcohol, Ethyl (B): <10 mg/dL (ref <10)

## 2021-09-08 LAB — AMMONIA: Ammonia: 137 umol/L — ABNORMAL HIGH (ref 9–35)

## 2021-09-08 LAB — LIPASE, BLOOD: Lipase: 32 U/L (ref 11–51)

## 2021-09-08 MED ORDER — FUROSEMIDE 10 MG/ML IJ SOLN
20.0000 mg | Freq: Once | INTRAMUSCULAR | Status: AC
  Administered 2021-09-09: 20 mg via INTRAVENOUS
  Filled 2021-09-08: qty 2

## 2021-09-08 MED ORDER — UMECLIDINIUM-VILANTEROL 62.5-25 MCG/ACT IN AEPB
1.0000 | INHALATION_SPRAY | Freq: Every day | RESPIRATORY_TRACT | Status: DC
  Administered 2021-09-09 – 2021-09-12 (×2): 1 via RESPIRATORY_TRACT
  Filled 2021-09-08 (×2): qty 14

## 2021-09-08 MED ORDER — SODIUM CHLORIDE 0.9 % IV SOLN
25.0000 ug/h | INTRAVENOUS | Status: DC
  Administered 2021-09-08: 25 ug/h via INTRAVENOUS
  Filled 2021-09-08: qty 1

## 2021-09-08 MED ORDER — PANTOPRAZOLE SODIUM 40 MG IV SOLR: 40.0000 mg | Freq: Two times a day (BID) | INTRAVENOUS | Status: DC

## 2021-09-08 MED ORDER — PANTOPRAZOLE 80MG IVPB - SIMPLE MED
80.0000 mg | Freq: Once | INTRAVENOUS | Status: AC
  Administered 2021-09-09: 80 mg via INTRAVENOUS
  Filled 2021-09-08: qty 80

## 2021-09-08 MED ORDER — LACTULOSE 10 GM/15ML PO SOLN
10.0000 g | Freq: Once | ORAL | Status: AC
  Administered 2021-09-09: 10 g via ORAL
  Filled 2021-09-08: qty 15

## 2021-09-08 MED ORDER — ACETAMINOPHEN 325 MG PO TABS: 650.0000 mg | ORAL_TABLET | Freq: Four times a day (QID) | ORAL | Status: DC | PRN

## 2021-09-08 MED ORDER — PANTOPRAZOLE INFUSION (NEW) - SIMPLE MED
8.0000 mg/h | INTRAVENOUS | Status: DC
  Administered 2021-09-09: 8 mg/h via INTRAVENOUS
  Filled 2021-09-08: qty 80
  Filled 2021-09-08: qty 100

## 2021-09-08 MED ORDER — OCTREOTIDE LOAD VIA INFUSION
50.0000 ug | Freq: Once | INTRAVENOUS | Status: AC
  Administered 2021-09-08: 50 ug via INTRAVENOUS
  Filled 2021-09-08: qty 25

## 2021-09-08 MED ORDER — SODIUM CHLORIDE 0.9 % IV SOLN
10.0000 mL/h | Freq: Once | INTRAVENOUS | Status: AC
  Administered 2021-09-08: 10 mL/h via INTRAVENOUS

## 2021-09-08 MED ORDER — ALBUTEROL SULFATE (2.5 MG/3ML) 0.083% IN NEBU: 2.5000 mg | INHALATION_SOLUTION | Freq: Four times a day (QID) | RESPIRATORY_TRACT | Status: DC | PRN

## 2021-09-08 MED ORDER — ACETAMINOPHEN 650 MG RE SUPP: 650.0000 mg | Freq: Four times a day (QID) | RECTAL | Status: DC | PRN

## 2021-09-08 MED ORDER — SODIUM CHLORIDE 0.9 % IV SOLN
2.0000 g | INTRAVENOUS | Status: DC
  Administered 2021-09-08: 2 g via INTRAVENOUS
  Filled 2021-09-08: qty 20

## 2021-09-08 MED ORDER — SODIUM CHLORIDE 0.9 % IV SOLN: 50.0000 ug/h | INTRAVENOUS | Status: DC

## 2021-09-08 NOTE — H&P (Signed)
History and Physical    PLEASE NOTE THAT DRAGON DICTATION SOFTWARE WAS USED IN THE CONSTRUCTION OF THIS NOTE.   [name] BWG:[id number] DOB: Feb 01, 1946 DOA: 09/08/2021  PCP: [name], MD  Patient coming from: home   I have personally briefly reviewed patient's old medical records in [place]  Chief Complaint: Generalized weakness  HPI: [name] is a 76 y.o. male with medical history significant for hepatocellular carcinoma, cirrhosis complicated by portal hypertension, chronic pancytopenia, upper GI bleed, GERD, who is admitted to [facility] on 09/08/2021 with suspected acute upper gastrointestinal bleed after presenting from home to [facility] ED complaining of generalized weakness.   The patient reports to 3 days of generalized weakness in the absence of any associated acute focal weakness, acute focal numbness, paresthesias, facial droop, slurred speech, expressive aphasia, acute change in vision, dysphagia, vertigo. Denies any subjective fever, chills, rigors, or generalized myalgias. Denies any recent headache, neck stiffness, rhinitis, rhinorrhea, sore throat, sob, wheezing, cough, nausea, vomiting, abdominal pain, diarrhea, or rash.  Denies any associated dysuria or gross hematuria.  Per chart review, the patient has a history of hepatocellular carcinoma as well as cirrhosis complicated by chronic pancytopenia, including chronic anemia, with baseline hemoglobin range of 7-9, with most recent prior hemoglobin noted to be 9.0 on 08/12/2021.    He has a history of multiple prior episodes of acute upper GI bleed, and most recently underwent EGD in September 2022, with this endoscopic evaluation associated with placement of hemostasis clip.  Additionally, per chart review, he has previously required PRBC transfusion in the setting of this history of acute upper GI bleed, with some documentation of a history of gastric ulcers.  The patient denies any recent  hematemesis, melena, or hematochezia.  No preceding trauma.  Denies any recent shortness of breath, chest pain, palpitations, diaphoresis, dizziness, presyncope, syncope.   In terms of alcohol consumption, the patient reports that he typically consumes two 12 ounce beers 3-4 times per week, with most recent alcohol consumption occurring approximately 2 days ago.  Denies any recent departure from this baseline consumption of alcohol.  Denies any use of blood thinners at home, including no aspirin.  He also denies any regular or recent use of NSAIDs, but acknowledges taking daily oral iron supplementation.  On Protonix 40 mg p.o. twice daily as an outpatient.   Per chart review, has history of chronic diastolic heart failure, most recent echocardiogram performed in May 2022 notable for LVEF 60 to 65%, no focal wall motion abnormalities, will showing moderate concentric LVH and evidence of grade 3 diastolic dysfunction.  In the setting of chronic diastolic heart failure as well as cirrhosis with portal hypertension, he is on both Lasix and spironolactone as an outpatient.    ED Course:  Vital signs in the ED were notable for the following: Afebrile; heart rate 68-82; blood pressure 118/51 -122/61; respiratory rate 15-20, oxygen saturation 100% on room air.  Labs were notable for the following: CMP notable for the following: BUN 21 compared to most recent prior value of 11 on 11 27,022, creatinine 1.17 compared to 1.03 on 07/25/2021, albumin 2.3, alkaline phosphatase 115, AST 42, ALT 22, total bilirubin 1.2.  Ammonia 137 compared to 135 on 07/23/2021.  CBC notable for white blood cell count 2700 compared to 3400 on 08/12/2021, hemoglobin 5.2 associated normocytic/normochromic findings as well as RDW of 16.4, platelet count 68.  Initial lactate 2.7, with repeat value trending down to 2.0.  Serum  ethanol level less than 10.  Urinalysis ordered, with result currently pending.  COVID-19/[place] PCR performed in the  ED today and found to be negative.  DRE revealed brown-colored stool that was found to be Hemoccult positive.  Imaging and additional notable ED work-up: EKG performed, however interpretation/significantly limited by the presence of artifact, but within this confines appears to demonstrate sinus rhythm with heart rates in the 70s, without clear evidence of acute ischemic changes.  Chest x-ray showed cardiomegaly with probable mild interstitial edema in the absence of infiltrate, overt pulmonary edema, pleural effusion, or pneumothorax.   EDP discussed case with Dr. Therisa Doyne of [facility] gastroenterology, who agrees with octreotide drip, Protonix drip, and will formally consult in the morning.  While in the ED, the following were administered: Octreotide drip, Protonix 80 mg IV x1 followed by initiation of Protonix drip, lactulose 10 g p.o. x1.  Additionally, type and screen was completed, and transfusion of 2 units PRBC was Initiated.  Socially, the patient was mated to PCU for further evaluation management of suspected presenting acute upper gastrointestinal bleed complicated by acute on chronic anemia.     Review of Systems: As per HPI otherwise 10 point review of systems negative.   Past Medical History:  Diagnosis Date   Arthritis    "joints tighten up sometimes" (03/27/2104)   Carcinoma of liver, hepatocellular ([place]) 06/30/2020   Chronic lower back pain    Coronary artery disease involving native coronary artery of native heart without angina pectoris    Severe left main disease at catheterization July 2015  CABG x3 with a LIMA to the LAD, SVG to the OM, SVG to the PDA on 03/31/14. EF 60% by cath.    Diastolic heart failure (HCC)    GERD without esophagitis 08/04/2010   Hepatic cirrhosis ([place])    a. Dx 01/2014 - CT a/p    History of blood transfusion    "related to bleeding ulcers"   History of concussion    1976--  NO RESIDUAL   History of GI bleed    a. UGIB 07/2012;  b. 01/2014 admission  with GIB/FOB stool req 1U prbc's->EGD showed portal gastropathy, barrett's esoph, and chronic active h. pylori gastritis.   History of gout    2007 &  2008  LEFT LEG-- NO ISSUE SINCE   Hyperlipidemia    Iron deficiency anemia    Kidney stones    OA (osteoarthritis of spine)    LOWER BACK--  INTERMITTANT LEFT LEG NUMBNESS   OSA (obstructive sleep apnea)    PULMOLOGIST-  DR CLANCE--  MODERATE OSA  STARTED CPAP 2012--  BUT CURRENTLY HAS NOT USED PAST 6 MONTHS   Phimosis    a. s/p circumcision 2015.   Type 2 diabetes mellitus ([place])    Unspecified essential hypertension     Past Surgical History:  Procedure Laterality Date   ANKLE FRACTURE SURGERY Right 1989   "plate put in"   APPENDECTOMY  05-16-2004   open   BIOPSY  01/08/2021   Procedure: BIOPSY;  Surgeon: Wilford Corner, MD;  Location: [facility];  Service: Endoscopy;;   BIOPSY  05/02/2021   Procedure: BIOPSY;  Surgeon: Arta Silence, MD;  Location: [facility];  Service: Endoscopy;;   CIRCUMCISION N/A 09/09/2013   Procedure: CIRCUMCISION ADULT;  Surgeon: Bernestine Amass, MD;  Location: [facility];  Service: Urology;  Laterality: N/A;   COLECTOMY  05-16-2004   COLONOSCOPY N/A 05/02/2021   Procedure: COLONOSCOPY;  Surgeon: Arta Silence, MD;  Location: [facility];  Service: Endoscopy;  Laterality: N/A;   COLONOSCOPY WITH PROPOFOL N/A 11/20/2020   Procedure: COLONOSCOPY WITH PROPOFOL;  Surgeon: Ronald Lobo, MD;  Location: [facility];  Service: Endoscopy;  Laterality: N/A;   CORONARY ANGIOPLASTY WITH STENT PLACEMENT  06/28/2012  DR COOPER   PCI W/  X1 DES to [place]. LAD/  LM  40% OSTIAL & 50-60% DISTAL /  50% PROX LCX/  30-40% PROX RCA & 50% MID RCA/   LVEF 65-70%   CORONARY ARTERY BYPASS GRAFT N/A 03/31/2014   Procedure: CORONARY ARTERY BYPASS GRAFTING (CABG) times 3 using left internal mammary artery and right saphenous vein.;  Surgeon: Melrose Nakayama, MD;  Location: [place];  Service: Open Heart Surgery;   Laterality: N/A;   DOBUTAMINE STRESS ECHO  06-08-2012   MODERATE HYPOKINESIS/ ISCHEMIA MID INFERIOR WALL   ESOPHAGOGASTRODUODENOSCOPY  08/15/2012   Procedure: ESOPHAGOGASTRODUODENOSCOPY (EGD);  Surgeon: Wonda Horner, MD;  Location: [facility] END[facility]COPY;  Service: Endoscopy;  Laterality: N/A;   ESOPHAGOGASTRODUODENOSCOPY N/A 02/17/2014   Procedure: ESOPHAGOGASTRODUODENOSCOPY (EGD);  Surgeon: Jeryl Columbia, MD;  Location: [facility] ENDOSCOPY;  Service: Endoscopy;  Laterality: N/A;   ESOPHAGOGASTRODUODENOSCOPY N/A 04/17/2020   Procedure: ESOPHAGOGASTRODUODENOSCOPY (EGD);  Surgeon: Ronnette Juniper, MD;  Location: [place];  Service: Gastroenterology;  Laterality: N/A;   ESOPHAGOGASTRODUODENOSCOPY N/A 09/11/2020   Procedure: ESOPHAGOGASTRODUODENOSCOPY (EGD);  Surgeon: Clarene Essex, MD;  Location: [facility];  Service: Endoscopy;  Laterality: N/A;   ESOPHAGOGASTRODUODENOSCOPY  10/09/2020   ESOPHAGOGASTRODUODENOSCOPY N/A 10/09/2020   Procedure: ESOPHAGOGASTRODUODENOSCOPY (EGD);  Surgeon: Otis Brace, MD;  Location: [facility] ENDOSCOPY;  Service: Gastroenterology;  Laterality: N/A;   ESOPHAGOGASTRODUODENOSCOPY N/A 01/08/2021   Procedure: ESOPHAGOGASTRODUODENOSCOPY (EGD);  Surgeon: Wilford Corner, MD;  Location: [facility];  Service: Endoscopy;  Laterality: N/A;   ESOPHAGOGASTRODUODENOSCOPY N/A 05/02/2021   Procedure: ESOPHAGOGASTRODUODENOSCOPY (EGD);  Surgeon: Arta Silence, MD;  Location: [facility];  Service: Endoscopy;  Laterality: N/A;   ESOPHAGOGASTRODUODENOSCOPY (EGD) WITH PROPOFOL N/A 09/30/2017   Procedure: ESOPHAGOGASTRODUODENOSCOPY (EGD) WITH PROPOFOL;  Surgeon: Wonda Horner, MD;  Location: [facility] ENDOSCOPY;  Service: Endoscopy;  Laterality: N/A;   ESOPHAGOGASTRODUODENOSCOPY (EGD) WITH PROPOFOL N/A 12/20/2017   Procedure: ESOPHAGOGASTRODUODENOSCOPY (EGD) WITH PROPOFOL;  Surgeon: Clarene Essex, MD;  Location: [place];  Service: Endoscopy;  Laterality: N/A;   ESOPHAGOGASTRODUODENOSCOPY (EGD) WITH PROPOFOL N/A  08/10/2020   Procedure: ESOPHAGOGASTRODUODENOSCOPY (EGD) WITH PROPOFOL;  Surgeon: Wilford Corner, MD;  Location: [place];  Service: Endoscopy;  Laterality: N/A;   ESOPHAGOGASTRODUODENOSCOPY (EGD) WITH PROPOFOL N/A 11/20/2020   Procedure: ESOPHAGOGASTRODUODENOSCOPY (EGD) WITH PROPOFOL;  Surgeon: Ronald Lobo, MD;  [facility];  Service: Endoscopy;  Laterality: N/A;   ESOPHAGOGASTRODUODENOSCOPY (EGD) WITH PROPOFOL N/A 02/28/2021   Procedure: ESOPHAGOGASTRODUODENOSCOPY (EGD) WITH PROPOFOL;  Surgeon: Clarene Essex, MD;  [facility];  Service: Endoscopy;  Laterality: N/A;   GIVENS CAPSULE STUDY N/A 11/20/2020   Procedure: GIVENS CAPSULE STUDY;  Surgeon: Ronald Lobo, MD;  [facility];  Service: Endoscopy;  Laterality: N/A;   HEMOSTASIS CLIP PLACEMENT  05/02/2021   Procedure: HEMOSTASIS CLIP PLACEMENT;  Surgeon: Arta Silence, MD;  [facility];  Service: Endoscopy;;   HERNIA REPAIR     INTRAOPERATIVE TRANSESOPHAGEAL ECHOCARDIOGRAM N/A 03/31/2014   Procedure: INTRAOPERATIVE TRANSESOPHAGEAL ECHOCARDIOGRAM;  Surgeon: Melrose Nakayama, MD;  Location: [place];  Service: Open Heart Surgery;  Laterality: N/A;   IR ANGIOGRAM SELECTIVE EACH ADDITIONAL VESSEL  06/16/2020   IR ANGIOGRAM SELECTIVE EACH ADDITIONAL VESSEL  06/16/2020   IR ANGIOGRAM SELECTIVE EACH ADDITIONAL VESSEL  06/16/2020  IR ANGIOGRAM SELECTIVE EACH ADDITIONAL VESSEL  06/16/2020   IR ANGIOGRAM SELECTIVE EACH ADDITIONAL VESSEL  06/16/2020   IR ANGIOGRAM SELECTIVE EACH ADDITIONAL VESSEL  06/16/2020   IR ANGIOGRAM SELECTIVE EACH ADDITIONAL VESSEL  06/16/2020   IR ANGIOGRAM SELECTIVE EACH ADDITIONAL VESSEL  06/16/2020   IR ANGIOGRAM SELECTIVE EACH ADDITIONAL VESSEL  06/16/2020   IR ANGIOGRAM SELECTIVE EACH ADDITIONAL VESSEL  06/30/2020   IR ANGIOGRAM SELECTIVE EACH ADDITIONAL VESSEL  06/30/2020   IR ANGIOGRAM VISCERAL SELECTIVE  06/16/2020   IR ANGIOGRAM VISCERAL SELECTIVE  06/30/2020   IR  EMBO ARTERIAL NOT HEMORR HEMANG INC GUIDE ROADMAPPING  06/16/2020   IR EMBO TUMOR ORGAN ISCHEMIA INFARCT INC GUIDE ROADMAPPING  06/30/2020   IR EMBO VENOUS NOT HEMORR HEMANG  INC GUIDE ROADMAPPING  01/13/2021   IR IVUS EACH ADDITIONAL NON CORONARY VESSEL  01/13/2021   IR RADIOLOGIST EVAL & MGMT  05/06/2020   IR RADIOLOGIST EVAL & MGMT  05/20/2020   IR RADIOLOGIST EVAL & MGMT  05/25/2021   IR TIPS  01/13/2021   IR US GUIDE VASC ACCESS RIGHT  06/16/2020   IR US GUIDE VASC ACCESS RIGHT  06/30/2020   IR US GUIDE VASC ACCESS RIGHT  01/13/2021   LAPAROSCOPIC UMBILICAL HERNIA REPAIR W/ MESH  06-06-2011   LEFT HEART CATHETERIZATION WITH CORONARY ANGIOGRAM N/A 03/28/2014   Procedure: LEFT HEART CATHETERIZATION WITH CORONARY ANGIOGRAM;  Surgeon: Sinclair Grooms, MD;  Location: [facility] CATH LAB;  Service: Cardiovascular;  Laterality: N/A;   LIPOMA EXCISION Left 08/25/2017   Procedure: EXCISION LIPOMA LEFT POSTERIOR THIGH;  Surgeon: Clovis Riley, MD;  Location: [place];  Service: General;  Laterality: Left;   NEPHROLITHOTOMY  1990'S   OPEN APPENDECTOMY W/ PARTIAL CECECTOMY  05-16-2004   PERCUTANEOUS CORONARY STENT INTERVENTION (PCI-S) N/A 06/28/2012   Procedure: PERCUTANEOUS CORONARY STENT INTERVENTION (PCI-S);  Surgeon: Sherren Mocha, MD;  Location: [facility] CATH LAB;  Service: Cardiovascular;  Laterality: N/A;   POLYPECTOMY  11/20/2020   Procedure: POLYPECTOMY;  Surgeon: Ronald Lobo, MD;  [facility];  Service: Endoscopy;;   POLYPECTOMY  05/02/2021   Procedure: POLYPECTOMY;  Surgeon: Arta Silence, MD;  [facility];  Service: Endoscopy;;   RADIOLOGY WITH ANESTHESIA N/A 01/13/2021   Procedure: IR WITH ANESTHESIA - TIPS;  Surgeon: Suzette Battiest, MD;  Location: [place];  Service: Radiology;  Laterality: N/A;    Social History:  reports that he quit smoking about 25 years ago. His smoking use included cigarettes. He has a 80.00 pack-year smoking history. He has never used smokeless tobacco. He  reports that he does not currently use alcohol after a past usage of about 8.0 standard drinks per week. He reports that he does not use drugs.   Allergies  Allergen Reactions   Fluzone Quadrivalent [Influenza Vac Split Quad] Other (See Comments)    The patient stated, in 10/2020: "I am not taking any more flu shots. It liked to have killed me."    Family History  Problem Relation Age of Onset   Lung cancer Sister    Cancer Sister        lung   Cancer Mother    Cancer Father        died in his 22s.   Cancer Brother        lung   Coronary artery disease Other    Diabetes Other    Colon cancer Other    Cancer Sister        lung  Family history reviewed and not pertinent    Prior to Admission medications   Medication Sig Start Date End Date Taking? Authorizing Provider  Accu-Chek Softclix Lancets lancets USE AS DIRECTED TO TEST BLOOD SUGAR FOUR TIMES DAILY 03/13/20   [name], MD  albuterol (VENTOLIN HFA) 108 (90 Base) MCG/ACT inhaler Inhale 2 puffs into the lungs every 6 (six) hours as needed for wheezing or shortness of breath. 03/01/21   Aline August, MD  ANORO ELLIPTA 62.5-25 MCG/ACT AEPB Inhale 1 puff into the lungs daily. 07/25/21   Mercy Riding, MD  blood glucose meter kit and supplies Dispense based on patient and insurance preference. Use up to four times daily as directed. (FOR ICD-10 E10.9, E11.9). 03/12/20   [name], MD  ferrous sulfate 325 (65 FE) MG tablet Take 1 tablet (325 mg total) by mouth daily with breakfast. 05/06/21 08/04/21  Dahal, Marlowe Aschoff, MD  furosemide (LASIX) 40 MG tablet Take 1 tablet (40 mg total) by mouth daily. 07/25/21 10/23/21  Mercy Riding, MD  lactulose (CHRONULAC) 10 GM/15ML solution Take 45 mLs (30 g total) by mouth 3 (three) times daily. 09/07/21   [name], MD  pantoprazole (PROTONIX) 40 MG tablet Take 1 tablet (40 mg total) by mouth 2 (two) times daily before a meal. 06/18/21   Ghimire, Henreitta Leber, MD   rifaximin (XIFAXAN) 550 MG TABS tablet Take 1 tablet (550 mg total) by mouth 2 (two) times daily. 06/18/21   Ghimire, Henreitta Leber, MD  spironolactone (ALDACTONE) 50 MG tablet Take 1 tablet (50 mg total) by mouth daily. 07/25/21   Mercy Riding, MD     Objective    Physical Exam: Vitals:   09/08/21 2130 09/08/21 2141 09/08/21 2145 09/08/21 2157  BP: (!) 117/48 (!) 124/53 (!) 118/51 (!) 121/50  Pulse: 77 73 74 72  Resp: _0 Temp:  98 F (36.7 C)  97.8 F (36.6 C)  TempSrc:  Oral  Oral  SpO2: 100% 100% 100% 100%  Weight:      Height:        General: appears to be stated age; alert, oriented Skin: warm, dry, no rash Head:  AT/[place] Mouth:  Oral mucosa membranes appear moist, normal dentition Neck: supple; trachea midline Heart:  RRR; did not appreciate any M/R/G Lungs: CTAB, did not appreciate any wheezes, rales, or rhonchi Abdomen: + BS; soft, ND, NT Vascular: 2+ pedal pulses b/l; 2+ radial pulses b/l Extremities: no peripheral edema, no muscle wasting Neuro: strength and sensation intact in upper and lower extremities b/l    Labs on Admission: I have personally reviewed following labs and imaging studies  CBC: Recent Labs  Lab 09/08/21 1720  WBC 2.7*  NEUTROABS 2.1  HGB 5.2*  HCT 17.2*  MCV 85.6  PLT 98*   Basic Metabolic Panel: Recent Labs  Lab 09/08/21 1720  NA 135  K 3.8  CL 106  CO2 17*  GLUCOSE 129*  BUN 21  CREATININE 1.17  CALCIUM 8.1*   GFR: Estimated Creatinine Clearance: 58.7 mL/min (by C-G formula based on SCr of 1.17 mg/dL). Liver Function Tests: Recent Labs  Lab 09/08/21 1720  AST 42*  ALT 22  ALKPHOS 115  BILITOT 1.2  PROT 5.3*  ALBUMIN 2.3*   Recent Labs  Lab 09/08/21 1720  LIPASE 32   Recent Labs  Lab 09/08/21 1720  AMMONIA 137*   Coagulation Profile: No results for input(s): INR, PROTIME in the last 168 hours.  Cardiac Enzymes: No results for input(s): CKTOTAL, CKMB, CKMBINDEX, TROPONINI in the last 168  hours. BNP (last 3 results) No results for input(s): PROBNP in the last 8760 hours. HbA1C: No results for input(s): HGBA1C in the last 72 hours. CBG: No results for input(s): GLUCAP in the last 168 hours. Lipid Profile: No results for input(s): CHOL, HDL, LDLCALC, TRIG, CHOLHDL, LDLDIRECT in the last 72 hours. Thyroid Function Tests: No results for input(s): TSH, T4TOTAL, FREET4, T3FREE, THYROIDAB in the last 72 hours. Anemia Panel: No results for input(s): VITAMINB12, FOLATE, FERRITIN, TIBC, IRON, RETICCTPCT in the last 72 hours. Urine analysis:    Component Value Date/Time   COLORURINE YELLOW 07/24/2021 0407   APPEARANCEUR HAZY (A) 07/24/2021 0407   LABSPEC 1.017 07/24/2021 0407   PHURINE 5.0 07/24/2021 0407   GLUCOSEU NEGATIVE 07/24/2021 0407   HGBUR MODERATE (A) 07/24/2021 0407   BILIRUBINUR NEGATIVE 07/24/2021 0407   KETONESUR NEGATIVE 07/24/2021 0407   PROTEINUR NEGATIVE 07/24/2021 0407   UROBILINOGEN 0.2 03/29/2014 1546   NITRITE NEGATIVE 07/24/2021 0407   LEUKOCYTESUR MODERATE (A) 07/24/2021 0407    Radiological Exams on Admission: DG Chest Port 1 View  Result Date: 09/08/2021 CLINICAL DATA:  Weakness, confused, disoriented, history of CHF EXAM: PORTABLE CHEST 1 VIEW COMPARISON:  Chest radiograph 05/28/2021 FINDINGS: Median sternotomy wires and mediastinal surgical clips are again noted. The heart is enlarged, unchanged. The mediastinal contours are grossly stable. There are diffusely increased interstitial markings throughout both lungs along with central vascular congestion. There is no focal consolidation. There is no significant pleural effusion. There is no pneumothorax There is no acute osseous abnormality. IMPRESSION: Cardiomegaly with probable mild to moderate pulmonary interstitial edema. No focal consolidation or significant pleural effusion. Electronically Signed   By: Valetta Mole M.D.   On: 09/08/2021 18:10     EKG: Independently reviewed, with result as  described above.    Assessment/Plan   Principal Problem:   Acute upper GI bleed Active Problems:   Acute on chronic anemia   Cirrhosis (HCC)   Lactic acidosis   GERD (gastroesophageal reflux disease)   Chronic diastolic CHF (congestive heart failure) (HCC)   Generalized weakness       #) Acute Upper GI Bleed: diagnosis on the basis of elevated BUN,  with DRE revealing Hemoccult positive stool, and presentation also a/w evidence of acute on chronic anemia, with presenting Hgb noted to be 5.2 relative to baseline hemoglobin range of 7-9.   Not on any blood thinners as an outpatient, including no aspirin. Denies NSAID use.  However, he has a documented history of cirrhosis in the setting of hepatocellular carcinoma, complicated by portal hypertension with documentation of history of portal gastropathy and multiple prior episodes of acute upper gastrointestinal bleed, requiring prior PRBC transfusion, as further described above.  Denies any recent history of alcohol abuse, as further quantified above.  He also has a history of GERD, and acknowledges good compliance with outpatient Protonix 40 mg p.o. twice daily.  Most recent prior EGD appears to have occurred in September 2022, as further detailed above.   Patient's case was discussed with Dr. Therisa Doyne of [facility] gastroenterology, who agrees with octreotide drip, Protonix drip, and will formally consult in the morning. Additionally, in the setting of known underlying liver disease, will initiate SBP prophylaxis, as below. At this time ddx broadly includes, but is not limited to gastritis vs erosive esophagitis vs PUD (gastric versus duodenal distribution), particular given documentation of a history of gastric ulcers vs Dieulafoy  lesion vs AVM.  Differential also includes esophageal varices, although relative hemodynamic and overall clinical stability at this time appears to speak against this possibility at present.   At this time, the patient  appears hemodynamically stable, with normotensive blood pressures in the absence of any associated tachycardia. Asymptomatic, without any associated hematemesis. Of note, patient was typed and screened in the ED today, with initiation of transfusion of 2 units.  Received.        Plan: NPO. Refraining from pharmacologic DVT prophylaxis. Monitor on telemetry. Monitor continuous pulse-ox. Maintain at least 2 large bore IV's. Check INR. Q4H H&H's have been ordered through 9 AM on 09/09/2021. Will closely monitor these ensuing Hgb levels and correlate these data points with the patient's overall clinical picture including vital signs to determine need for additional prbc transfusion. On-call GI consulted, as above. CMP in the AM. Protonix drip.  Octreotide drip.  Rocephin for SBP prophylaxis, as above.  Additionally, given the patient's history of liver disease as well as his chronic diastolic heart failure, I have ordered a dose of Lasix 20 mg IV x1 to be administered following completion of second unit PRBC.        #) Acute on chronic anemia: in the context of a documented history of chronic anemia, in part as a consequence of pancytopenia relating to his cirrhosis, with baseline hemoglobin ranges 7-9, presenting hemoglobin noted to be 5.2, with this to peripheral baseline suspected to be the basis of acute upper GI bleed, as above. At this time, patient appears hemodynamically stable and asymptomatic, as further described above.  Transfusing 2 units PRBC, as above.    Plan: work-up and management for presenting suspected acute upper GI bleed, as above, including close monitoring of Q4H H&H's, with clinical evaluation for determination of need for additional blood transfusion, as further described above. Monitor on telemetry.  NPO. Refraining from pharmacologic DVT prophylaxis. Check INR.  Add on the following to initial lab specimen collected in the ED today: total iron, TIBC, ferritin, MMA, folic acid  level, reticulocyte count. Gastroenterology consulted, as above.        #) Lactic acidosis: Initial lactate noted to be 2.7, with repeat value trending down to 2.0.  Suspect that this is multifactorial in nature, with some degree of elevation in the setting of his cirrhosis, as well as mild dehydration from suspected acute blood loss anemia, as well as contribution from relative diffuse tissue hypoperfusion as a result of decrease in oxygen delivery capacity as consequence of presenting suspected acute upper GI bleed resulting in acute on chronic anemia, as above.  Does not meet SIRS criteria for sepsis, nor is there any evidence of underlying infectious process at this time.  However, urinalysis result is currently pending.  Of note, covid/influenza PCR found to be negative.   Plan: Further evaluation management of acute on chronic anemia in the setting of suspected acute upper gastrointestinal bleed, including PBC transfusion and serial H&H checks, as above.  Follow-up result urinalysis.  Repeat CBC in the morning.      #) Generalized weakness: Patient reports to 3 days of generalized weakness in the absence of any acute focal neurologic deficits.  Presentation is suggestive of acute CVA.  Suspect that generalized weakness stems from acute upper gastrointestinal bleed resulting in acute on chronic anemia, as further quantified above, all superimposed on underlying cirrhosis.  No evidence of acute underlying infectious process, although urinalysis result is currently pending.  Plan: Further evaluation management of acute  on chronic anemia in the setting of acute upper gastrointestinal bleed, including plan for PRBC transfusion, as above.  Follow-up result urinalysis.  Repeat CMP and CBC in the morning.  Check TSH.     #) Cirrhosis: Documented history of such in the setting of history of hepatocellular carcinoma, complicated by pancytopenia, as further quantified above, as well as documented  history of portal hypertension, for which the patient is on spironolactone as well as Lasix at home.  Of note, presenting sodium 135, with total bilirubin 1.2.  However, unable to anticoagulate [place] at this time in the absence of any current INR data point.  Patient denies any recent history of alcohol abuse, and serum ethanol non-elevated.  In setting of presenting suspected acute upper gastrointestinal bleed, will focus on this presenting pathology. As such, will hold home scheduled Lasix and spironolactone for now, with plan to reevaluate volume status in the morning following interval PRBC transfusion.  Of note, presenting ammonia level similar to value from November 2022, as further quantified above.  Plan: Monitor strict I's and O's and daily weights.  Repeat CMP in the morning.  Hold home spironolactone and Lasix for now, as above.  Check INR.      #) Chronic diastolic heart failure: documented history of such, with most recent echocardiogram performed in May 2022 notable for grade 3 diastolic dysfunction, with additional results as further described above. No clinical evidence to suggest acutely decompensated heart failure at this time. home diuretic regimen reportedly consists of the following: Lasix and spironolactone, particularly in the setting of concomitant cirrhosis, as above.    Plan: monitor strict I's & O's and daily weights. Repeat CMP in AM. Check serum mag level.  Holding home diuretic regimen for now in the context of presenting acute upper GI bleed, as further detailed above.        #) GERD: documented h/o such; on Protonix 40 mg p.o. twice daily as outpatient.   Plan: In the setting of suspected presenting acute upper gastrointestinal bleed, and associated recommendations from on-call gastroenterology for Protonix drip, will continue with Protonix drip for now, and hold home oral Protonix in the interval.     DVT prophylaxis: SCD's   Code Status: Full  code Family Communication: none Disposition Plan: Per Rounding Team Consults called: Dr. Therisa Doyne of GI consulted, as further detailed;  Admission status: obs; pcu   PLEASE NOTE THAT DRAGON DICTATION SOFTWARE WAS USED IN THE CONSTRUCTION OF THIS NOTE.   [place] DO [facility]  From [place]   09/08/2021, 10:13 PM

## 2021-09-08 NOTE — ED Triage Notes (Signed)
BIB [facility] from home. Wife reports pt laying in bed x4 days. Not eating or drinking. Confused oriented only to self. HX liver cancer, CHF, diabetes. Normal ambulatory Aox4.  EMS 97.0 F oral 138/76 CBG 179 19 RR

## 2021-09-08 NOTE — ED Notes (Signed)
MD Notified of lactic results

## 2021-09-08 NOTE — ED Notes (Signed)
This RN tried to reach pts spouse to get consent for blood transfusion d/t pt being altered - pt spouse did not answer at this time

## 2021-09-08 NOTE — ED Notes (Signed)
Verbal consent via phone call obtained by pt son Bryden Darden at this time for pt blood transfusion

## 2021-09-08 NOTE — ED Provider Notes (Signed)
[facility] EMERGENCY DEPARTMENT Provider Note   CSN: [id number] Arrival date & time: 09/08/21  1702     History  Chief Complaint  Patient presents with   Altered Mental Status    [name] is a 75 y.o. male.  HPI Patient with multiple medical issues including liver cancer presents with listlessness, weakness.  Patient is withdrawn, offering minimal details of his history.  Details are obtained by EMS providers, from family and per chart review.  In addition to history of liver cancer he also has CHF, diabetes.  Typically the patient is awake, alert, oriented x4, but according his wife for 4 days he has been bedbound, listless, no reported fever, fall, hypotension, by EMS or wife.    Home Medications Prior to Admission medications   Medication Sig Start Date End Date Taking? Authorizing Provider  Accu-Chek Softclix Lancets lancets USE AS DIRECTED TO TEST BLOOD SUGAR FOUR TIMES DAILY 03/13/20   [name], MD  albuterol (VENTOLIN HFA) 108 (90 Base) MCG/ACT inhaler Inhale 2 puffs into the lungs every 6 (six) hours as needed for wheezing or shortness of breath. 03/01/21   Aline August, MD  ANORO ELLIPTA 62.5-25 MCG/ACT AEPB Inhale 1 puff into the lungs daily. 07/25/21   Mercy Riding, MD  blood glucose meter kit and supplies Dispense based on patient and insurance preference. Use up to four times daily as directed. (FOR ICD-10 E10.9, E11.9). 03/12/20   [name], MD  ferrous sulfate 325 (65 FE) MG tablet Take 1 tablet (325 mg total) by mouth daily with breakfast. 05/06/21 08/04/21  Dahal, Marlowe Aschoff, MD  furosemide (LASIX) 40 MG tablet Take 1 tablet (40 mg total) by mouth daily. 07/25/21 10/23/21  Mercy Riding, MD  lactulose (CHRONULAC) 10 GM/15ML solution Take 45 mLs (30 g total) by mouth 3 (three) times daily. 09/07/21   [name], MD  pantoprazole (PROTONIX) 40 MG tablet Take 1 tablet (40 mg total) by mouth 2 (two) times daily before a  meal. 06/18/21   Ghimire, Henreitta Leber, MD  rifaximin (XIFAXAN) 550 MG TABS tablet Take 1 tablet (550 mg total) by mouth 2 (two) times daily. 06/18/21   Ghimire, Henreitta Leber, MD  spironolactone (ALDACTONE) 50 MG tablet Take 1 tablet (50 mg total) by mouth daily. 07/25/21   Mercy Riding, MD      Allergies    Fluzone quadrivalent [influenza vac split quad]    Review of Systems   Review of Systems  Physical Exam Updated Vital Signs BP (!) 118/51    Pulse 74    Temp 98 F (36.7 C) (Oral)    Resp 20    Ht _0  (1.702 m)    Wt 91 kg    SpO2 100%    BMI 31.42 kg/m  Physical Exam Vitals and nursing note reviewed.  Constitutional:      Appearance: He is well-developed. He is obese. He is ill-appearing and diaphoretic.  HENT:     Head: Normocephalic and atraumatic.  Eyes:     Conjunctiva/sclera: Conjunctivae normal.  Cardiovascular:     Rate and Rhythm: Regular rhythm. Tachycardia present.  Pulmonary:     Effort: Pulmonary effort is normal. No respiratory distress.     Breath sounds: No stridor.  Abdominal:     General: There is no distension.  Skin:    General: Skin is warm.     Coloration: Skin is jaundiced.  Neurological:     Mental Status:  He is oriented to person, place, and time.  Psychiatric:        Behavior: Behavior is slowed and withdrawn.    ED Results / Procedures / Treatments   Labs (all labs ordered are listed, but only abnormal results are displayed) Labs Reviewed  COMPREHENSIVE METABOLIC PANEL - Abnormal; Notable for the following components:      Result Value   CO2 17 (*)    Glucose, Bld 129 (*)    Calcium 8.1 (*)    Total Protein 5.3 (*)    Albumin 2.3 (*)    AST 42 (*)    All other components within normal limits  LACTIC ACID, PLASMA - Abnormal; Notable for the following components:   Lactic Acid, Venous 2.7 (*)    All other components within normal limits  LACTIC ACID, PLASMA - Abnormal; Notable for the following components:   Lactic Acid, Venous 2.0  (*)    All other components within normal limits  CBC WITH DIFFERENTIAL/PLATELET - Abnormal; Notable for the following components:   WBC 2.7 (*)    RBC 2.01 (*)    Hemoglobin 5.2 (*)    HCT 17.2 (*)    MCH 25.9 (*)    RDW 16.4 (*)    Platelets 98 (*)    Lymphs Abs 0.4 (*)    All other components within normal limits  AMMONIA - Abnormal; Notable for the following components:   Ammonia 137 (*)    All other components within normal limits  POC OCCULT BLOOD, ED - Abnormal; Notable for the following components:   Fecal Occult Bld POSITIVE (*)    All other components within normal limits  RESP PANEL BY RT-PCR (FLU A&B, COVID) ARPGX2  ETHANOL  LIPASE, BLOOD  URINALYSIS, ROUTINE W REFLEX MICROSCOPIC  TYPE AND SCREEN  PREPARE RBC (CROSSMATCH)    EKG EKG Interpretation  Date/Time:  Wednesday September 08 2021 17:10:11 EST Ventricular Rate:  76 PR Interval:    QRS Duration: 167 QT Interval:  366 QTC Calculation: 412 R Axis:   100 Text Interpretation: likely sinus rhythm but w substantial artefact Abnormal ECG Confirmed by Carmin Muskrat [phone]) on 09/08/2021 6:58:16 PM  Radiology DG Chest Port 1 View  Result Date: 09/08/2021 CLINICAL DATA:  Weakness, confused, disoriented, history of CHF EXAM: PORTABLE CHEST 1 VIEW COMPARISON:  Chest radiograph 05/28/2021 FINDINGS: Median sternotomy wires and mediastinal surgical clips are again noted. The heart is enlarged, unchanged. The mediastinal contours are grossly stable. There are diffusely increased interstitial markings throughout both lungs along with central vascular congestion. There is no focal consolidation. There is no significant pleural effusion. There is no pneumothorax There is no acute osseous abnormality. IMPRESSION: Cardiomegaly with probable mild to moderate pulmonary interstitial edema. No focal consolidation or significant pleural effusion. Electronically Signed   By: Valetta Mole M.D.   On: 09/08/2021 18:10     Procedures Procedures    Medications Ordered in ED Medications  lactulose (CHRONULAC) 10 GM/15ML solution 10 g (has no administration in time range)  octreotide (SANDOSTATIN) 500 mcg in sodium chloride 0.9 % 250 mL (2 mcg/mL) infusion (has no administration in time range)  octreotide (SANDOSTATIN) 2 mcg/mL load via infusion 50 mcg (has no administration in time range)  pantoprazole (PROTONIX) 80 mg /NS 100 mL IVPB (has no administration in time range)  pantoprozole (PROTONIX) 80 mg /NS 100 mL infusion (has no administration in time range)  pantoprazole (PROTONIX) injection 40 mg (has no administration in time range)  0.9 %  sodium chloride infusion (10 mL/hr Intravenous New Bag/Given 09/08/21 2147)    ED Course/ Medical Decision Making/ A&P  9:55 PM Patient hemodynamically unremarkable.  Discussed this case with our GI colleague, with consideration of his hepatocellular carcinoma, mild encephalopathy will receive lactulose for hyperammonemia, octreotide and Protonix previously written given his history of varices, Hemoccult positive stool.  In addition, the patient found to have anemia, likely contributing to his listlessness, 2 L PRBC ordered.                         Medical Decision Making Adult male presents with listlessness.  Patient reportedly has disorientation, but here is oriented to self.  With differential including progression of his hepatocellular carcinoma versus anemia versus infection versus hepatic encephalopathy or other phenomena patient had broad labs, chest x-ray performed.  Case discussed with our GI team, internal medicine team for appropriate interventions, a.m. consult with gastroenterology, admission.  Interventions in the ED as above, fluids, lactulose, Protonix, octreotide, blood transfusion.  I interpreted the x-ray myself, no pneumonia.  CRITICAL CARE Performed by: Carmin Muskrat Total critical care time: 45 minutes Critical care time was exclusive of  separately billable procedures and treating other patients. Critical care was necessary to treat or prevent imminent or life-threatening deterioration. Critical care was time spent personally by me on the following activities: development of treatment plan with patient and/or surrogate as well as nursing, discussions with consultants, evaluation of patient's response to treatment, examination of patient, obtaining history from patient or surrogate, ordering and performing treatments and interventions, ordering and review of laboratory studies, ordering and review of radiographic studies, pulse oximetry and re-evaluation of patient's condition.        Final Clinical Impression(s) / ED Diagnoses Final diagnoses:  Hepatic encephalopathy  Acute upper GI bleed     Carmin Muskrat, MD 09/08/21 2159

## 2021-09-09 ENCOUNTER — Observation Stay (HOSPITAL_COMMUNITY): Payer: Medicare HMO

## 2021-09-09 DIAGNOSIS — I864 Gastric varices: Secondary | ICD-10-CM | POA: Diagnosis not present

## 2021-09-09 DIAGNOSIS — D649 Anemia, unspecified: Secondary | ICD-10-CM | POA: Diagnosis not present

## 2021-09-09 DIAGNOSIS — K7460 Unspecified cirrhosis of liver: Secondary | ICD-10-CM | POA: Diagnosis not present

## 2021-09-09 DIAGNOSIS — N200 Calculus of kidney: Secondary | ICD-10-CM | POA: Diagnosis not present

## 2021-09-09 DIAGNOSIS — K922 Gastrointestinal hemorrhage, unspecified: Secondary | ICD-10-CM | POA: Diagnosis not present

## 2021-09-09 DIAGNOSIS — K766 Portal hypertension: Secondary | ICD-10-CM | POA: Diagnosis not present

## 2021-09-09 LAB — COMPREHENSIVE METABOLIC PANEL
ALT: 21 U/L (ref 0–44)
AST: 34 U/L (ref 15–41)
Albumin: 2.1 g/dL — ABNORMAL LOW (ref 3.5–5.0)
Alkaline Phosphatase: 120 U/L (ref 38–126)
Anion gap: 5 (ref 5–15)
BUN: 20 mg/dL (ref 8–23)
CO2: 19 mmol/L — ABNORMAL LOW (ref 22–32)
Calcium: 7.8 mg/dL — ABNORMAL LOW (ref 8.9–10.3)
Chloride: 109 mmol/L (ref 98–111)
Creatinine, Ser: 1.15 mg/dL (ref 0.61–1.24)
GFR, Estimated: >60 mL/min (ref >60)
Glucose, Bld: 111 mg/dL — ABNORMAL HIGH (ref 70–99)
Potassium: 3.8 mmol/L (ref 3.5–5.1)
Sodium: 133 mmol/L — ABNORMAL LOW (ref 135–145)
Total Bilirubin: 1.4 mg/dL — ABNORMAL HIGH (ref 0.3–1.2)
Total Protein: 5.1 g/dL — ABNORMAL LOW (ref 6.5–8.1)

## 2021-09-09 LAB — URINALYSIS, ROUTINE W REFLEX MICROSCOPIC
Bilirubin Urine: NEGATIVE
Glucose, UA: NEGATIVE mg/dL
Hgb urine dipstick: NEGATIVE
Ketones, ur: NEGATIVE mg/dL
Nitrite: NEGATIVE
Protein, ur: NEGATIVE mg/dL
Specific Gravity, Urine: 1.015 (ref 1.005–1.030)
pH: 7 (ref 5.0–8.0)

## 2021-09-09 LAB — CBC WITH DIFFERENTIAL/PLATELET
Abs Immature Granulocytes: 0.01 10*3/uL (ref 0.00–0.07)
Abs Immature Granulocytes: 0.01 10*3/uL (ref 0.00–0.07)
Abs Immature Granulocytes: 0.04 10*3/uL (ref 0.00–0.07)
Basophils Absolute: 0 10*3/uL (ref 0.0–0.1)
Basophils Absolute: 0 10*3/uL (ref 0.0–0.1)
Basophils Absolute: 0 10*3/uL (ref 0.0–0.1)
Basophils Relative: 0 %
Basophils Relative: 1 %
Basophils Relative: 0 %
Eosinophils Absolute: 0 10*3/uL (ref 0.0–0.5)
Eosinophils Absolute: 0.1 10*3/uL (ref 0.0–0.5)
Eosinophils Absolute: 0.1 10*3/uL (ref 0.0–0.5)
Eosinophils Relative: 1 %
Eosinophils Relative: 4 %
Eosinophils Relative: 3 %
HCT: 19.3 % — ABNORMAL LOW (ref 39.0–52.0)
HCT: 23.1 % — ABNORMAL LOW (ref 39.0–52.0)
HCT: 26.9 % — ABNORMAL LOW (ref 39.0–52.0)
Hemoglobin: 5.9 g/dL — CL (ref 13.0–17.0)
Hemoglobin: 7.3 g/dL — ABNORMAL LOW (ref 13.0–17.0)
Hemoglobin: 8.6 g/dL — ABNORMAL LOW (ref 13.0–17.0)
Immature Granulocytes: 0 %
Immature Granulocytes: 0 %
Immature Granulocytes: 1 %
Lymphocytes Relative: 17 %
Lymphocytes Relative: 16 %
Lymphocytes Relative: 28 %
Lymphs Abs: 0.5 10*3/uL — ABNORMAL LOW (ref 0.7–4.0)
Lymphs Abs: 0.4 10*3/uL — ABNORMAL LOW (ref 0.7–4.0)
Lymphs Abs: 1.4 10*3/uL (ref 0.7–4.0)
MCH: 26.2 pg (ref 26.0–34.0)
MCH: 26.2 pg (ref 26.0–34.0)
MCH: 27.2 pg (ref 26.0–34.0)
MCHC: 30.6 g/dL (ref 30.0–36.0)
MCHC: 31.6 g/dL (ref 30.0–36.0)
MCHC: 32 g/dL (ref 30.0–36.0)
MCV: 85.8 fL (ref 80.0–100.0)
MCV: 82.8 fL (ref 80.0–100.0)
MCV: 85.1 fL (ref 80.0–100.0)
Monocytes Absolute: 0.3 10*3/uL (ref 0.1–1.0)
Monocytes Absolute: 0.3 10*3/uL (ref 0.1–1.0)
Monocytes Absolute: 0.5 10*3/uL (ref 0.1–1.0)
Monocytes Relative: 9 %
Monocytes Relative: 10 %
Monocytes Relative: 9 %
Neutro Abs: 1.9 10*3/uL (ref 1.7–7.7)
Neutro Abs: 1.9 10*3/uL (ref 1.7–7.7)
Neutro Abs: 3 10*3/uL (ref 1.7–7.7)
Neutrophils Relative %: 73 %
Neutrophils Relative %: 69 %
Neutrophils Relative %: 59 %
Platelets: 85 10*3/uL — ABNORMAL LOW (ref 150–400)
Platelets: 91 10*3/uL — ABNORMAL LOW (ref 150–400)
Platelets: 94 10*3/uL — ABNORMAL LOW (ref 150–400)
RBC: 2.25 MIL/uL — ABNORMAL LOW (ref 4.22–5.81)
RBC: 2.79 MIL/uL — ABNORMAL LOW (ref 4.22–5.81)
RBC: 3.16 MIL/uL — ABNORMAL LOW (ref 4.22–5.81)
RDW: 15.9 % — ABNORMAL HIGH (ref 11.5–15.5)
RDW: 15.5 % (ref 11.5–15.5)
RDW: 15.8 % — ABNORMAL HIGH (ref 11.5–15.5)
WBC: 2.7 10*3/uL — ABNORMAL LOW (ref 4.0–10.5)
WBC: 2.7 10*3/uL — ABNORMAL LOW (ref 4.0–10.5)
WBC: 5.1 10*3/uL (ref 4.0–10.5)
nRBC: 0 % (ref 0.0–0.2)
nRBC: 0 % (ref 0.0–0.2)
nRBC: 0 % (ref 0.0–0.2)

## 2021-09-09 LAB — PROTIME-INR
INR: 1.3 — ABNORMAL HIGH (ref 0.8–1.2)
Prothrombin Time: 15.8 seconds — ABNORMAL HIGH (ref 11.4–15.2)

## 2021-09-09 LAB — RETICULOCYTES
Immature Retic Fract: 32.6 % — ABNORMAL HIGH (ref 2.3–15.9)
RBC.: 2.28 MIL/uL — ABNORMAL LOW (ref 4.22–5.81)
Retic Count, Absolute: 54.5 10*3/uL (ref 19.0–186.0)
Retic Ct Pct: 2.4 % (ref 0.4–3.1)

## 2021-09-09 LAB — HEMOGLOBIN AND HEMATOCRIT, BLOOD
HCT: 19.9 % — ABNORMAL LOW (ref 39.0–52.0)
Hemoglobin: 5.8 g/dL — CL (ref 13.0–17.0)

## 2021-09-09 LAB — MAGNESIUM
Magnesium: 2.1 mg/dL (ref 1.7–2.4)
Magnesium: 2.1 mg/dL (ref 1.7–2.4)

## 2021-09-09 LAB — URINALYSIS, MICROSCOPIC (REFLEX)

## 2021-09-09 LAB — PREPARE RBC (CROSSMATCH)

## 2021-09-09 LAB — IRON AND TIBC
Iron: 39 ug/dL — ABNORMAL LOW (ref 45–182)
Saturation Ratios: 12 % — ABNORMAL LOW (ref 17.9–39.5)
TIBC: 328 ug/dL (ref 250–450)
UIBC: 289 ug/dL

## 2021-09-09 LAB — FOLATE: Folate: 11.2 ng/mL (ref >5.9)

## 2021-09-09 LAB — FERRITIN: Ferritin: 15 ng/mL — ABNORMAL LOW (ref 24–336)

## 2021-09-09 LAB — TSH: TSH: 1.21 u[IU]/mL (ref 0.350–4.500)

## 2021-09-09 MED ORDER — LACTULOSE ENEMA
300.0000 mL | Freq: Once | ORAL | Status: DC
  Filled 2021-09-09: qty 300

## 2021-09-09 MED ORDER — DIPHENHYDRAMINE HCL 25 MG PO CAPS: 25.0000 mg | ORAL_CAPSULE | Freq: Once | ORAL | Status: DC

## 2021-09-09 MED ORDER — FUROSEMIDE 40 MG PO TABS
40.0000 mg | ORAL_TABLET | Freq: Every day | ORAL | Status: DC
  Administered 2021-09-09 – 2021-09-10 (×2): 40 mg via ORAL
  Filled 2021-09-09: qty 2
  Filled 2021-09-09: qty 1

## 2021-09-09 MED ORDER — IOHEXOL 300 MG/ML  SOLN
100.0000 mL | Freq: Once | INTRAMUSCULAR | Status: AC | PRN
  Administered 2021-09-09: 100 mL via INTRAVENOUS

## 2021-09-09 MED ORDER — FERROUS SULFATE 325 (65 FE) MG PO TABS
325.0000 mg | ORAL_TABLET | Freq: Two times a day (BID) | ORAL | Status: DC
  Administered 2021-09-09 – 2021-09-12 (×6): 325 mg via ORAL
  Filled 2021-09-09 (×6): qty 1

## 2021-09-09 MED ORDER — RIFAXIMIN 550 MG PO TABS
550.0000 mg | ORAL_TABLET | Freq: Two times a day (BID) | ORAL | Status: DC
  Administered 2021-09-09 – 2021-09-12 (×7): 550 mg via ORAL
  Filled 2021-09-09 (×8): qty 1

## 2021-09-09 MED ORDER — PANTOPRAZOLE SODIUM 40 MG PO TBEC
40.0000 mg | DELAYED_RELEASE_TABLET | Freq: Two times a day (BID) | ORAL | Status: DC
  Administered 2021-09-09 – 2021-09-12 (×6): 40 mg via ORAL
  Filled 2021-09-09 (×6): qty 1

## 2021-09-09 MED ORDER — ACETAMINOPHEN 325 MG PO TABS: 650.0000 mg | ORAL_TABLET | Freq: Once | ORAL | Status: DC

## 2021-09-09 MED ORDER — SODIUM CHLORIDE 0.9% IV SOLUTION: 250.0000 mL | Freq: Once | INTRAVENOUS | Status: DC

## 2021-09-09 MED ORDER — ORAL CARE MOUTH RINSE
15.0000 mL | Freq: Two times a day (BID) | OROMUCOSAL | Status: DC
  Administered 2021-09-09 – 2021-09-12 (×5): 15 mL via OROMUCOSAL

## 2021-09-09 MED ORDER — HEPARIN SOD (PORK) LOCK FLUSH 100 UNIT/ML IV SOLN: 500.0000 [IU] | Freq: Every day | INTRAVENOUS | Status: DC | PRN

## 2021-09-09 MED ORDER — SPIRONOLACTONE 25 MG PO TABS
50.0000 mg | ORAL_TABLET | Freq: Every day | ORAL | Status: DC
  Administered 2021-09-09 – 2021-09-10 (×2): 50 mg via ORAL
  Filled 2021-09-09 (×2): qty 2

## 2021-09-09 MED ORDER — SODIUM CHLORIDE 0.9% FLUSH: 3.0000 mL | INTRAVENOUS | Status: DC | PRN

## 2021-09-09 MED ORDER — LACTULOSE 10 GM/15ML PO SOLN
30.0000 g | Freq: Three times a day (TID) | ORAL | Status: DC
  Administered 2021-09-09 – 2021-09-12 (×9): 30 g via ORAL
  Filled 2021-09-09 (×9): qty 45

## 2021-09-09 MED ORDER — SODIUM CHLORIDE 0.9% FLUSH: 10.0000 mL | INTRAVENOUS | Status: DC | PRN

## 2021-09-09 MED ORDER — HEPARIN SOD (PORK) LOCK FLUSH 100 UNIT/ML IV SOLN: 250.0000 [IU] | INTRAVENOUS | Status: DC | PRN

## 2021-09-09 NOTE — ED Notes (Signed)
[name] in the lab messaged this RN to inform her that 1 unit PRBC ready.Pt currently has second unit started. 2nd unit started at 0656 this am.

## 2021-09-09 NOTE — Consult Note (Signed)
Referring Provider: [facility] Primary Care Physician:  [name], MD Primary Gastroenterologist:  [facility]  Reason for Consultation:  Acute on chronic anemia  HPI: [name] is a 76 y.o. male with medical history significant for hepatocellular carcinoma, cirrhosis complicated by portal hypertension, chronic pancytopenia, upper GI bleed, chronic anemia, GERD, CHF (ECHO 12/2020 EF 60-65%) who presents for acute on chronic anemia with heme positive stool.  Majority of history was obtained through chart review patient was disoriented on exam.  Appears patient came to ED with 3 days of generalized weakness, facial droop, slurred speech, aphasia.  He was then found to have acute on chronic anemia and heme positive stool.  Patient's baseline hemoglobin ranges from 7-9.  Upon arrival to ED, hemoglobin was 5.2.   Patient has had history of multiple GI bleeds with multiple endoscopies.  Most recent being in 04/2021 with Dr. [name] as an inpatient EGD: Normal esophagus, portal hypertensive gastropathy, type I isolated gastric varices without bleeding, multiple nonbleeding angioectasias in the stomach, few small proximal duodenal AVMs.  Estimated over 15 gastric AVMs as well as mild to moderate gastropathy. Colonoscopy: Hemorrhoids, ulcerated mucosa in the rectum and rectosigmoid, diverticulosis in sigmoid and descending colon, single nonbleeding colonic angiectasia (clip was placed), few nonbleeding colonic angioectasias.  Patient has extensive history of daily alcohol abuse. Per chart review, last consumption was two days ago.  Past Medical History:  Diagnosis Date   Arthritis    "joints tighten up sometimes" (03/27/2104)   Carcinoma of liver, hepatocellular ([place]) 06/30/2020   Chronic lower back pain    Coronary artery disease involving native coronary artery of native heart without angina pectoris    Severe left main disease at catheterization July 2015  CABG x3 with a LIMA to the LAD, SVG to  the OM, SVG to the PDA on 03/31/14. EF 60% by cath.    Diastolic heart failure (HCC)    GERD without esophagitis 08/04/2010   Hepatic cirrhosis ([place])    a. Dx 01/2014 - CT a/p    History of blood transfusion    "related to bleeding ulcers"   History of concussion    1976--  NO RESIDUAL   History of GI bleed    a. UGIB 07/2012;  b. 01/2014 admission with GIB/FOB stool req 1U prbc's->EGD showed portal gastropathy, barrett's esoph, and chronic active h. pylori gastritis.   History of gout    2007 &  2008  LEFT LEG-- NO ISSUE SINCE   Hyperlipidemia    Iron deficiency anemia    Kidney stones    OA (osteoarthritis of spine)    LOWER BACK--  INTERMITTANT LEFT LEG NUMBNESS   OSA (obstructive sleep apnea)    PULMOLOGIST-  DR CLANCE--  MODERATE OSA  STARTED CPAP 2012--  BUT CURRENTLY HAS NOT USED PAST 6 MONTHS   Phimosis    a. s/p circumcision 2015.   Type 2 diabetes mellitus ([place])    Unspecified essential hypertension     Past Surgical History:  Procedure Laterality Date   ANKLE FRACTURE SURGERY Right 1989   "plate put in"   APPENDECTOMY  05-16-2004   open   BIOPSY  01/08/2021   Procedure: BIOPSY;  Surgeon: Wilford Corner, MD;  [facility];  Service: Endoscopy;;   BIOPSY  05/02/2021   Procedure: BIOPSY;  Surgeon: Arta Silence, MD;  [facility]: [facility];  Service: Endoscopy;;   CIRCUMCISION N/A 09/09/2013   Procedure: CIRCUMCISION ADULT;  Surgeon: Bernestine Amass, MD;  Location:  [place];  Service: Urology;  Laterality: N/A;   COLECTOMY  05-16-2004   COLONOSCOPY N/A 05/02/2021   Procedure: COLONOSCOPY;  Surgeon: Arta Silence, MD;  [facility];  Service: Endoscopy;  Laterality: N/A;   COLONOSCOPY WITH PROPOFOL N/A 11/20/2020   Procedure: COLONOSCOPY WITH PROPOFOL;  Surgeon: Ronald Lobo, MD;  [facility];  Service: Endoscopy;  Laterality: N/A;   CORONARY ANGIOPLASTY WITH STENT PLACEMENT  06/28/2012  DR COOPER   PCI W/  X1 DES to  [place]. LAD/  LM  40% OSTIAL & 50-60% DISTAL /  50% PROX LCX/  30-40% PROX RCA & 50% MID RCA/   LVEF 65-70%   CORONARY ARTERY BYPASS GRAFT N/A 03/31/2014   Procedure: CORONARY ARTERY BYPASS GRAFTING (CABG) times 3 using left internal mammary artery and right saphenous vein.;  Surgeon: Melrose Nakayama, MD;  Location: [place];  Service: Open Heart Surgery;  Laterality: N/A;   DOBUTAMINE STRESS ECHO  06-08-2012   MODERATE HYPOKINESIS/ ISCHEMIA MID INFERIOR WALL   ESOPHAGOGASTRODUODENOSCOPY  08/15/2012   Procedure: ESOPHAGOGASTRODUODENOSCOPY (EGD);  Surgeon: Wonda Horner, MD;  Location: [facility] ENDOSCOPY;  Service: Endoscopy;  Laterality: N/A;   ESOPHAGOGASTRODUODENOSCOPY N/A 02/17/2014   Procedure: ESOPHAGOGASTRODUODENOSCOPY (EGD);  Surgeon: Jeryl Columbia, MD;  Location: [facility] ENDOSCOPY;  Service: Endoscopy;  Laterality: N/A;   ESOPHAGOGASTRODUODENOSCOPY N/A 04/17/2020   Procedure: ESOPHAGOGASTRODUODENOSCOPY (EGD);  Surgeon: Ronnette Juniper, MD;  Location: [place];  Service: Gastroenterology;  Laterality: N/A;   ESOPHAGOGASTRODUODENOSCOPY N/A 09/11/2020   Procedure: ESOPHAGOGASTRODUODENOSCOPY (EGD);  Surgeon: Clarene Essex, MD;  [facility];  Service: Endoscopy;  Laterality: N/A;   ESOPHAGOGASTRODUODENOSCOPY  10/09/2020   ESOPHAGOGASTRODUODENOSCOPY N/A 10/09/2020   Procedure: ESOPHAGOGASTRODUODENOSCOPY (EGD);  Surgeon: Otis Brace, MD;  Location: [facility] ENDOSCOPY;  Service: Gastroenterology;  Laterality: N/A;   ESOPHAGOGASTRODUODENOSCOPY N/A 01/08/2021   Procedure: ESOPHAGOGASTRODUODENOSCOPY (EGD);  Surgeon: Wilford Corner, MD;  [facility];  Service: Endoscopy;  Laterality: N/A;   ESOPHAGOGASTRODUODENOSCOPY N/A 05/02/2021   Procedure: ESOPHAGOGASTRODUODENOSCOPY (EGD);  Surgeon: Arta Silence, MD;  [facility];  Service: Endoscopy;  Laterality: N/A;   ESOPHAGOGASTRODUODENOSCOPY (EGD) WITH PROPOFOL N/A 09/30/2017   Procedure: ESOPHAGOGASTRODUODENOSCOPY (EGD) WITH PROPOFOL;   Surgeon: Wonda Horner, MD;  Location: [facility] ENDOSCOPY;  Service: Endoscopy;  Laterality: N/A;   ESOPHAGOGASTRODUODENOSCOPY (EGD) WITH PROPOFOL N/A 12/20/2017   Procedure: ESOPHAGOGASTRODUODENOSCOPY (EGD) WITH PROPOFOL;  Surgeon: Clarene Essex, MD;  Location: [place];  Service: Endoscopy;  Laterality: N/A;   ESOPHAGOGASTRODUODENOSCOPY (EGD) WITH PROPOFOL N/A 08/10/2020   Procedure: ESOPHAGOGASTRODUODENOSCOPY (EGD) WITH PROPOFOL;  Surgeon: Wilford Corner, MD;  Location: [place];  Service: Endoscopy;  Laterality: N/A;   ESOPHAGOGASTRODUODENOSCOPY (EGD) WITH PROPOFOL N/A 11/20/2020   Procedure: ESOPHAGOGASTRODUODENOSCOPY (EGD) WITH PROPOFOL;  Surgeon: Ronald Lobo, MD;  [facility];  Service: Endoscopy;  Laterality: N/A;   ESOPHAGOGASTRODUODENOSCOPY (EGD) WITH PROPOFOL N/A 02/28/2021   Procedure: ESOPHAGOGASTRODUODENOSCOPY (EGD) WITH PROPOFOL;  Surgeon: Clarene Essex, MD;  [facility];  Service: Endoscopy;  Laterality: N/A;   GIVENS CAPSULE STUDY N/A 11/20/2020   Procedure: GIVENS CAPSULE STUDY;  Surgeon: Ronald Lobo, MD;  [facility];  Service: Endoscopy;  Laterality: N/A;   HEMOSTASIS CLIP PLACEMENT  05/02/2021   Procedure: HEMOSTASIS CLIP PLACEMENT;  Surgeon: Arta Silence, MD;  [facility];  Service: Endoscopy;;   HERNIA REPAIR     INTRAOPERATIVE TRANSESOPHAGEAL ECHOCARDIOGRAM N/A 03/31/2014   Procedure: INTRAOPERATIVE TRANSESOPHAGEAL ECHOCARDIOGRAM;  Surgeon: Melrose Nakayama, MD;  Location: [place];  Service: Open Heart Surgery;  Laterality: N/A;  IR ANGIOGRAM SELECTIVE EACH ADDITIONAL VESSEL  06/16/2020   IR ANGIOGRAM SELECTIVE EACH ADDITIONAL VESSEL  06/16/2020   IR ANGIOGRAM SELECTIVE EACH ADDITIONAL VESSEL  06/16/2020   IR ANGIOGRAM SELECTIVE EACH ADDITIONAL VESSEL  06/16/2020   IR ANGIOGRAM SELECTIVE EACH ADDITIONAL VESSEL  06/16/2020   IR ANGIOGRAM SELECTIVE EACH ADDITIONAL VESSEL  06/16/2020   IR ANGIOGRAM SELECTIVE EACH ADDITIONAL  VESSEL  06/16/2020   IR ANGIOGRAM SELECTIVE EACH ADDITIONAL VESSEL  06/16/2020   IR ANGIOGRAM SELECTIVE EACH ADDITIONAL VESSEL  06/16/2020   IR ANGIOGRAM SELECTIVE EACH ADDITIONAL VESSEL  06/30/2020   IR ANGIOGRAM SELECTIVE EACH ADDITIONAL VESSEL  06/30/2020   IR ANGIOGRAM VISCERAL SELECTIVE  06/16/2020   IR ANGIOGRAM VISCERAL SELECTIVE  06/30/2020   IR EMBO ARTERIAL NOT HEMORR HEMANG INC GUIDE ROADMAPPING  06/16/2020   IR EMBO TUMOR ORGAN ISCHEMIA INFARCT INC GUIDE ROADMAPPING  06/30/2020   IR EMBO VENOUS NOT HEMORR HEMANG  INC GUIDE ROADMAPPING  01/13/2021   IR IVUS EACH ADDITIONAL NON CORONARY VESSEL  01/13/2021   IR RADIOLOGIST EVAL & MGMT  05/06/2020   IR RADIOLOGIST EVAL & MGMT  05/20/2020   IR RADIOLOGIST EVAL & MGMT  05/25/2021   IR TIPS  01/13/2021   IR US GUIDE VASC ACCESS RIGHT  06/16/2020   IR US GUIDE VASC ACCESS RIGHT  06/30/2020   IR US GUIDE VASC ACCESS RIGHT  01/13/2021   LAPAROSCOPIC UMBILICAL HERNIA REPAIR W/ MESH  06-06-2011   LEFT HEART CATHETERIZATION WITH CORONARY ANGIOGRAM N/A 03/28/2014   Procedure: LEFT HEART CATHETERIZATION WITH CORONARY ANGIOGRAM;  Surgeon: Sinclair Grooms, MD;  Location: [facility] CATH LAB;  Service: Cardiovascular;  Laterality: N/A;   LIPOMA EXCISION Left 08/25/2017   Procedure: EXCISION LIPOMA LEFT POSTERIOR THIGH;  Surgeon: Clovis Riley, MD;  Location: [place];  Service: General;  Laterality: Left;   NEPHROLITHOTOMY  1990'S   OPEN APPENDECTOMY W/ PARTIAL CECECTOMY  05-16-2004   PERCUTANEOUS CORONARY STENT INTERVENTION (PCI-S) N/A 06/28/2012   Procedure: PERCUTANEOUS CORONARY STENT INTERVENTION (PCI-S);  Surgeon: Sherren Mocha, MD;  Location: [facility] CATH LAB;  Service: Cardiovascular;  Laterality: N/A;   POLYPECTOMY  11/20/2020   Procedure: POLYPECTOMY;  Surgeon: Ronald Lobo, MD;  [facility];  Service: Endoscopy;;   POLYPECTOMY  05/02/2021   Procedure: POLYPECTOMY;  Surgeon: Arta Silence, MD;  [facility];  Service: Endoscopy;;    RADIOLOGY WITH ANESTHESIA N/A 01/13/2021   Procedure: IR WITH ANESTHESIA - TIPS;  Surgeon: Suzette Battiest, MD;  Location: [place];  Service: Radiology;  Laterality: N/A;    Prior to Admission medications   Medication Sig Start Date End Date Taking? Authorizing Provider  Accu-Chek Softclix Lancets lancets USE AS DIRECTED TO TEST BLOOD [place] FOUR TIMES DAILY 03/13/20   [name], MD  albuterol (VENTOLIN HFA) 108 (90 Base) MCG/ACT inhaler Inhale 2 puffs into the lungs every 6 (six) hours as needed for wheezing or shortness of breath. 03/01/21   Aline August, MD  ANORO ELLIPTA 62.5-25 MCG/ACT AEPB Inhale 1 puff into the lungs daily. 07/25/21   Mercy Riding, MD  blood glucose meter kit and supplies Dispense based on patient and insurance preference. Use up to four times daily as directed. (FOR ICD-10 E10.9, E11.9). 03/12/20   [name], MD  ferrous sulfate 325 (65 FE) MG tablet Take 1 tablet (325 mg total) by mouth daily with breakfast. 05/06/21 08/04/21  Dahal, Marlowe Aschoff, MD  furosemide (LASIX) 40 MG tablet Take 1 tablet (40 mg total)  by mouth daily. 07/25/21 10/23/21  Mercy Riding, MD  lactulose (CHRONULAC) 10 GM/15ML solution Take 45 mLs (30 g total) by mouth 3 (three) times daily. 09/07/21   [name], MD  pantoprazole (PROTONIX) 40 MG tablet Take 1 tablet (40 mg total) by mouth 2 (two) times daily before a meal. 06/18/21   Ghimire, Henreitta Leber, MD  rifaximin (XIFAXAN) 550 MG TABS tablet Take 1 tablet (550 mg total) by mouth 2 (two) times daily. 06/18/21   Ghimire, Henreitta Leber, MD  spironolactone (ALDACTONE) 50 MG tablet Take 1 tablet (50 mg total) by mouth daily. 07/25/21   Mercy Riding, MD    Scheduled Meds:  furosemide  40 mg Oral Daily   [START ON 09/12/2021] pantoprazole  40 mg Intravenous Q12H   rifaximin  550 mg Oral BID   spironolactone  50 mg Oral Daily   umeclidinium-vilanterol  1 puff Inhalation Daily   Continuous Infusions:  cefTRIAXone (ROCEPHIN)  IV  Stopped (09/09/21 0000)   octreotide  (SANDOSTATIN)    IV infusion 25 mcg/hr (09/08/21 2318)   pantoprazole 8 mg/hr (09/09/21 0057)   PRN Meds:.acetaminophen **OR** acetaminophen, albuterol  Allergies as of 09/08/2021 - Review Complete 09/08/2021  Allergen Reaction Noted   Fluzone quadrivalent [influenza vac split quad] Other (See Comments) 01/07/2021    Family History  Problem Relation Age of Onset   Lung cancer Sister    Cancer Sister        lung   Cancer Mother    Cancer Father        died in his 30s.   Cancer Brother        lung   Coronary artery disease Other    Diabetes Other    Colon cancer Other    Cancer Sister        lung    Social History   Socioeconomic History   Marital status: Married    Spouse name: Not on file   Number of children: Not on file   Years of education: Not on file   Highest education level: Not on file  Occupational History   Not on file  Tobacco Use   Smoking status: Former    Packs/day: 2.00    Years: 40.00    Pack years: 80.00    Types: Cigarettes    Quit date: 08/29/1996    Years since quitting: 25.0   Smokeless tobacco: Never  Vaping Use   Vaping Use: Never used  Substance and Sexual Activity   Alcohol use: Not Currently    Alcohol/week: 8.0 standard drinks    Types: 8 Cans of beer per week    Comment: 2 beers every other day   Drug use: No   Sexual activity: Yes  Other Topics Concern   Not on file  Social History Narrative   Lives in [place] with wife, son, and son's family.   Social Determinants of Health   Financial Resource Strain: Medium Risk   Difficulty of Paying Living Expenses: Somewhat hard  Food Insecurity: No Food Insecurity   Worried About Charity fundraiser in the Last Year: Never true   Ran Out of Food in the Last Year: Never true  Transportation Needs: No Transportation Needs   Lack of Transportation (Medical): No   Lack of Transportation (Non-Medical): No  Physical Activity: Not on file  Stress: Not  on file  Social Connections: Not on file  Intimate Partner Violence: Not on file    Review of  Systems: Review of Systems  Unable to perform ROS: Mental status change    Physical Exam:Physical Exam Constitutional:      Appearance: Normal appearance.  HENT:     Head: Normocephalic and atraumatic.     Nose: Nose normal. No congestion.     Mouth/Throat:     Mouth: Mucous membranes are moist.     Pharynx: Oropharynx is clear.  Eyes:     Extraocular Movements: Extraocular movements intact.     Comments: Conjunctival pallor  Cardiovascular:     Rate and Rhythm: Normal rate and regular rhythm.  Pulmonary:     Effort: Pulmonary effort is normal.     Breath sounds: Normal breath sounds.  Abdominal:     General: Bowel sounds are normal. There is distension.     Palpations: There is no mass.     Tenderness: There is no abdominal tenderness. There is no guarding or rebound.     Hernia: No hernia is present.  Musculoskeletal:        General: Normal range of motion.     Cervical back: Normal range of motion and neck supple.     Comments: Bilateral lower leg edema  Skin:    General: Skin is warm and dry.  Neurological:     Mental Status: He is alert. He is disoriented.  Psychiatric:        Mood and Affect: Mood normal.        Behavior: Behavior normal.    Vital signs: Vitals:   09/09/21 0800 09/09/21 0815  BP: (!) 94/39 (!) 93/46  Pulse: 67 67  Resp: 13 15  Temp:    SpO2: 100% 100%        GI:  Lab Results: Recent Labs    09/08/21 1720 09/09/21 0309  WBC 2.7* 2.7*  HGB 5.2* 5.8*   5.9*  HCT 17.2* 19.9*   19.3*  PLT 98* 85*   BMET Recent Labs    09/08/21 1720 09/09/21 0309  NA 135 133*  K 3.8 3.8  CL 106 109  CO2 17* 19*  GLUCOSE 129* 111*  BUN 21 20  CREATININE 1.17 1.15  CALCIUM 8.1* 7.8*   LFT Recent Labs    09/09/21 0309  PROT 5.1*  ALBUMIN 2.1*  AST 34  ALT 21  ALKPHOS 120  BILITOT 1.4*   PT/INR Recent Labs    09/09/21 0309  LABPROT  15.8*  INR 1.3*     Studies/Results: DG Chest Port 1 View  Result Date: 09/08/2021 CLINICAL DATA:  Weakness, confused, disoriented, history of CHF EXAM: PORTABLE CHEST 1 VIEW COMPARISON:  Chest radiograph 05/28/2021 FINDINGS: Median sternotomy wires and mediastinal surgical clips are again noted. The heart is enlarged, unchanged. The mediastinal contours are grossly stable. There are diffusely increased interstitial markings throughout both lungs along with central vascular congestion. There is no focal consolidation. There is no significant pleural effusion. There is no pneumothorax There is no acute osseous abnormality. IMPRESSION: Cardiomegaly with probable mild to moderate pulmonary interstitial edema. No focal consolidation or significant pleural effusion. Electronically Signed   By: Valetta Mole M.D.   On: 09/08/2021 18:10    Impression: Acute on chronic anemia; Chronic gastric AVMS in addition to varices - hgb 5.9, MCV 85.8 -Iron 39, ferritin 15 -BUN 21, creatinine 1.15  Cirrhosis: Hepatocellular carcinoma -AST 34/ALT 21/alk phos 128/T bili 1.4  Chronic diastolic heart failure -Echo 12/2020 shows ejection fraction 60 to 65%  Plan: Patient has had multiple EGDs in the  past for same issue. Spoke with Dr. [name], plan is to manage conservatively and avoid EGD. Continue Protonix 74m IV BID Recommend oral iron supplementation as tolerated, if not tolerated recommend regular iron infusions. Continue supportive care [facility] will follow    LOS: 0 days   Braydan Marriott MRadford Pax PA-C 09/09/2021, 9:22 AM  Contact #  [phone]

## 2021-09-09 NOTE — Progress Notes (Addendum)
Interventional Radiology Brief Note:  IR aware of patient from prior TIPS creation 12/2020 by Dr. Serafina Royals.  Patient has not been seen in follow-up since 05/25/21.  At that time patient was felt to be a candidate for further decompression of the portal system due to extent of his varcies with minimal stent dilation after placement.  It was recommended he undergo CT A/P BRTO protocol to assess for possible targets for intervention, however this was not obtained. Patient now admitted with ongoing anemia and weakness. Hgb 5.5 on admission.  Planning for admission with blood transfusion.   Dr. Serafina Royals has discussed with GI.   Order placed for CT Abdomen Pelvis BRTO protocol.  IR will follow-up study results.    Addendum 1530: CT reviewed by Dr. Serafina Royals who notes TIPS appears widely patent.  Esophageal varices are again seen on CT today however they appear to accessible to endoscopic intervention if needed. No splenorenal shunt or major gastric varices.  No ectopic varices to explain occult bleeding.  TIPS check/portosystemic gradient measurements and possible TIPS expansion remains an option if recurrent significant worsening in bleeding, however not currently indicated. Plan made for follow-up with Dr. Serafina Royals in 6 months. Schedulers will contact patient to arrange.    Brynda Greathouse, MS RD PA-C

## 2021-09-09 NOTE — TOC Initial Note (Signed)
Transition of Care [facility]) - Initial/Assessment Note    Patient Details  Name: [name] MRN: [id number] Date of Birth: 02/25/1946  [facility] CM/SW Contact:    Milas Gain, Gerty Phone Number: 09/09/2021, 3:33 PM  Clinical Narrative:                  CSW received consult for possible SNF placement at time of discharge.Due to patients current orientation CSW tried to call patients son [name]. CSW LVM and awaiting callback to discuss PT recommendation/dc plan for patient. CSW will continue to follow and assist with patients dc planning needs.   Patient Goals and CMS Choice        Expected Discharge Plan and Services                                                Prior Living Arrangements/Services                       Activities of Daily Living      Permission Sought/Granted                  Emotional Assessment              Admission diagnosis:  Hepatic encephalopathy [K76.82] Thrombocytopenia ([place]) [D69.6] Acute upper GI bleed [K92.2] Acute on chronic anemia [D64.9] Patient Active Problem List   Diagnosis Date Noted   Lactic acidosis 09/08/2021   GERD (gastroesophageal reflux disease) 09/08/2021   Chronic diastolic CHF (congestive heart failure) (HCC)    Generalized weakness    Hypokalemia 07/23/2021   Acute hepatic encephalopathy 06/15/2021   Acute upper GI bleed 04/29/2021   Cirrhosis ([place]) 04/29/2021   Acute on chronic anemia 04/28/2021   GI bleeding 09/10/2020   Hepatocellular carcinoma ([place]) 08/09/2020   Routine general medical examination at a health care facility 05/06/2020   Iron deficiency anemia due to chronic blood loss    Pancytopenia ([place]) 04/15/2020   Pulmonary hypertension, unspecified ([place]) 03/11/2020   Asthma, mild intermittent 40/98/1191   Alcoholic cirrhosis of liver with ascites ([place]) 12/27/2019   Acute on chronic diastolic CHF (congestive heart failure) ([place]) 11/01/2019   (HFpEF) heart  failure with preserved ejection fraction ([place]) 12/24/2018   Acute on chronic anemia 12/18/2017   Peptic ulcer disease 12/18/2017   AKI (acute kidney injury) ([place]) 10/24/2017   GI bleed 09/29/2017   SOB (shortness of breath) 09/21/2017   Obesity (BMI 30.0-34.9) 02/05/2016   S/P CABG x 3 03/31/2014   Hepatic cirrhosis ([place]) 02/27/2014   Melena 08/14/2012   Hyperlipidemia with target LDL less than 70 06/29/2012   Thrombocytopenia ([place]) 06/29/2012   Coronary artery disease involving native coronary artery of native heart without angina pectoris    OSA (obstructive sleep apnea)    Type 2 diabetes with complication ([place]) 47/82/9562   GERD without esophagitis 08/04/2010   History of gout    Hypertensive heart disease    PCP:  [name], MD Pharmacy:   [facility] [place], [place] - [address] AT [facility] [address] [place] [address] Phone: [phone] Fax: [phone]     Social Determinants of Health (SDOH) Interventions    Readmission Risk Interventions Readmission Risk Prevention Plan 01/26/2021 08/12/2020 08/12/2020  Transportation Screening Complete -  Complete  PCP or Specialist Appt within 5-7 Days - - -  Not Complete comments - - -  PCP or Specialist Appt within 3-5 Days - Complete -  Home Care Screening - - -  Medication Review (RN CM) - - -  [facility] or [place] - - Complete  Social Work Consult for [place] Planning/Counseling - - Complete  Palliative Care Screening - - Not Applicable  Medication Review (RN Care Manager) Complete - -  PCP or Specialist appointment within 3-5 days of discharge Complete - -  [place] or Home Care Consult Complete - -  SW Recovery Care/Counseling Consult Complete - -  Palliative Care Screening Not Applicable - -  [place] Patient Refused - -  Some recent data might be hidden

## 2021-09-09 NOTE — ED Notes (Signed)
ED TO INPATIENT HANDOFF REPORT  ED Nurse Name and Phone #: Cindie Laroche [phone]  S Name/Age/Gender [name] 76 y.o. male Room/Bed: 034C/034C  Code Status   Code Status: Full Code  Home/SNF/Other Home Patient oriented to: self and place Is this baseline? No   Triage Complete: Triage complete  Chief Complaint Acute upper GI bleed [K92.2]  Triage Note BIB [facility] from home. Wife reports pt laying in bed x4 days. Not eating or drinking. Confused oriented only to self. HX liver cancer, CHF, diabetes. Normal ambulatory Aox4.  EMS 97.0 F oral 138/76 CBG 179 19 RR   Allergies Allergies  Allergen Reactions   Fluzone Quadrivalent [Influenza Vac Split Quad] Other (See Comments)    The patient stated, in 10/2020: "I am not taking any more flu shots. It liked to have killed me."    Level of Care/Admitting Diagnosis ED Disposition     ED Disposition  Admit   Condition  --   [place]: [place] [100100]  Level of Care: Progressive [102]  Admit to Progressive based on following criteria: MULTISYSTEM THREATS such as stable sepsis, metabolic/electrolyte imbalance with or without encephalopathy that is responding to early treatment.  Admit to Progressive based on following criteria: GI, ENDOCRINE disease patients with GI bleeding, acute liver failure or pancreatitis, stable with diabetic ketoacidosis or thyrotoxicosis (hypothyroid) state.  May place patient in observation at [facility] or [place] if equivalent level of care is available:: No  Covid Evaluation: Confirmed COVID Negative  Diagnosis: Acute upper GI bleed [831517]  Admitting Physician: Rhetta Mura [[id number]]  Attending Physician: Rhetta Mura [[id number]]          B Medical/Surgery History Past Medical History:  Diagnosis Date   Arthritis    "joints tighten up sometimes" (03/27/2104)   Carcinoma of liver, hepatocellular ([place]) 06/30/2020   Chronic lower back pain     Coronary artery disease involving native coronary artery of native heart without angina pectoris    Severe left main disease at catheterization July 2015  CABG x3 with a LIMA to the LAD, SVG to the OM, SVG to the PDA on 03/31/14. EF 60% by cath.    Diastolic heart failure (HCC)    GERD without esophagitis 08/04/2010   Hepatic cirrhosis ([place])    a. Dx 01/2014 - CT a/p    History of blood transfusion    "related to bleeding ulcers"   History of concussion    1976--  NO RESIDUAL   History of GI bleed    a. UGIB 07/2012;  b. 01/2014 admission with GIB/FOB stool req 1U prbc's->EGD showed portal gastropathy, barrett's esoph, and chronic active h. pylori gastritis.   History of gout    2007 &  2008  LEFT LEG-- NO ISSUE SINCE   Hyperlipidemia    Iron deficiency anemia    Kidney stones    OA (osteoarthritis of spine)    LOWER BACK--  INTERMITTANT LEFT LEG NUMBNESS   OSA (obstructive sleep apnea)    PULMOLOGIST-  DR CLANCE--  MODERATE OSA  STARTED CPAP 2012--  BUT CURRENTLY HAS NOT USED PAST 6 MONTHS   Phimosis    a. s/p circumcision 2015.   Type 2 diabetes mellitus ([place])    Unspecified essential hypertension    Past Surgical History:  Procedure Laterality Date   ANKLE FRACTURE SURGERY Right 1989   "plate put in"   APPENDECTOMY  05-16-2004   open   BIOPSY  01/08/2021  Procedure: BIOPSY;  Surgeon: Wilford Corner, MD;  [facility];  Service: Endoscopy;;   BIOPSY  05/02/2021   Procedure: BIOPSY;  Surgeon: Arta Silence, MD;  [facility];  Service: Endoscopy;;   CIRCUMCISION N/A 09/09/2013   Procedure: CIRCUMCISION ADULT;  Surgeon: Bernestine Amass, MD;  Location: [facility];  Service: Urology;  Laterality: N/A;   COLECTOMY  05-16-2004   COLONOSCOPY N/A 05/02/2021   Procedure: COLONOSCOPY;  Surgeon: Arta Silence, MD;  [facility];  Service: Endoscopy;  Laterality: N/A;   COLONOSCOPY WITH PROPOFOL N/A 11/20/2020   Procedure: COLONOSCOPY WITH  PROPOFOL;  Surgeon: Ronald Lobo, MD;  [facility];  Service: Endoscopy;  Laterality: N/A;   CORONARY ANGIOPLASTY WITH STENT PLACEMENT  06/28/2012  DR COOPER   PCI W/  X1 DES to [place]. LAD/  LM  40% OSTIAL & 50-60% DISTAL /  50% PROX LCX/  30-40% PROX RCA & 50% MID RCA/   LVEF 65-70%   CORONARY ARTERY BYPASS GRAFT N/A 03/31/2014   Procedure: CORONARY ARTERY BYPASS GRAFTING (CABG) times 3 using left internal mammary artery and right saphenous vein.;  Surgeon: Melrose Nakayama, MD;  Location: [facility];  Service: Open Heart Surgery;  Laterality: N/A;   DOBUTAMINE STRESS ECHO  06-08-2012   MODERATE HYPOKINESIS/ ISCHEMIA MID INFERIOR WALL   ESOPHAGOGASTRODUODENOSCOPY  08/15/2012   Procedure: ESOPHAGOGASTRODUODENOSCOPY (EGD);  Surgeon: Wonda Horner, MD;  Location: [facility];  Service: Endoscopy;  Laterality: N/A;   ESOPHAGOGASTRODUODENOSCOPY N/A 02/17/2014   Procedure: ESOPHAGOGASTRODUODENOSCOPY (EGD);  Surgeon: Jeryl Columbia, MD;  Location: [facility] ENDOSCOPY;  Service: Endoscopy;  Laterality: N/A;   ESOPHAGOGASTRODUODENOSCOPY N/A 04/17/2020   Procedure: ESOPHAGOGASTRODUODENOSCOPY (EGD);  Surgeon: Ronnette Juniper, MD;  Location: [place];  Service: Gastroenterology;  Laterality: N/A;   ESOPHAGOGASTRODUODENOSCOPY N/A 09/11/2020   Procedure: ESOPHAGOGASTRODUODENOSCOPY (EGD);  Surgeon: Clarene Essex, MD;  [facility];  Service: Endoscopy;  Laterality: N/A;   ESOPHAGOGASTRODUODENOSCOPY  10/09/2020   ESOPHAGOGASTRODUODENOSCOPY N/A 10/09/2020   Procedure: ESOPHAGOGASTRODUODENOSCOPY (EGD);  Surgeon: Otis Brace, MD;  [facility];  Service: Gastroenterology;  Laterality: N/A;   ESOPHAGOGASTRODUODENOSCOPY N/A 01/08/2021   Procedure: ESOPHAGOGASTRODUODENOSCOPY (EGD);  Surgeon: Wilford Corner, MD;  [facility];  Service: Endoscopy;  Laterality: N/A;   ESOPHAGOGASTRODUODENOSCOPY N/A 05/02/2021   Procedure: ESOPHAGOGASTRODUODENOSCOPY (EGD);  Surgeon: Arta Silence, MD;   [facility];  Service: Endoscopy;  Laterality: N/A;   ESOPHAGOGASTRODUODENOSCOPY (EGD) WITH PROPOFOL N/A 09/30/2017   Procedure: ESOPHAGOGASTRODUODENOSCOPY (EGD) WITH PROPOFOL;  Surgeon: Wonda Horner, MD;  Location: [facility]OSCOPY;  Service: Endoscopy;  Laterality: N/A;   ESOPHAGOGASTRODUODENOSCOPY (EGD) WITH PROPOFOL N/A 12/20/2017   Procedure: ESOPHAGOGASTRODUODENOSCOPY (EGD) WITH PROPOFOL;  Surgeon: Clarene Essex, MD;  Location: [place];  Service: Endoscopy;  Laterality: N/A;   ESOPHAGOGASTRODUODENOSCOPY (EGD) WITH PROPOFOL N/A 08/10/2020   Procedure: ESOPHAGOGASTRODUODENOSCOPY (EGD) WITH PROPOFOL;  Surgeon: Wilford Corner, MD;  Location: [place];  Service: Endoscopy;  Laterality: N/A;   ESOPHAGOGASTRODUODENOSCOPY (EGD) WITH PROPOFOL N/A 11/20/2020   Procedure: ESOPHAGOGASTRODUODENOSCOPY (EGD) WITH PROPOFOL;  Surgeon: Ronald Lobo, MD;  [facility];  Service: Endoscopy;  Laterality: N/A;   ESOPHAGOGASTRODUODENOSCOPY (EGD) WITH PROPOFOL N/A 02/28/2021   Procedure: ESOPHAGOGASTRODUODENOSCOPY (EGD) WITH PROPOFOL;  Surgeon: Clarene Essex, MD;  [facility];  Service: Endoscopy;  Laterality: N/A;   GIVENS CAPSULE STUDY N/A 11/20/2020   Procedure: GIVENS CAPSULE STUDY;  Surgeon: Ronald Lobo, MD;  [facility];  Service: Endoscopy;  Laterality: N/A;   HEMOSTASIS CLIP PLACEMENT  05/02/2021   Procedure:  HEMOSTASIS CLIP PLACEMENT;  Surgeon: Arta Silence, MD;  [facility];  Service: Endoscopy;;   HERNIA REPAIR     INTRAOPERATIVE TRANSESOPHAGEAL ECHOCARDIOGRAM N/A 03/31/2014   Procedure: INTRAOPERATIVE TRANSESOPHAGEAL ECHOCARDIOGRAM;  Surgeon: Melrose Nakayama, MD;  Location: [place];  Service: Open Heart Surgery;  Laterality: N/A;   IR ANGIOGRAM SELECTIVE EACH ADDITIONAL VESSEL  06/16/2020   IR ANGIOGRAM SELECTIVE EACH ADDITIONAL VESSEL  06/16/2020   IR ANGIOGRAM SELECTIVE EACH ADDITIONAL VESSEL  06/16/2020   IR ANGIOGRAM SELECTIVE EACH  ADDITIONAL VESSEL  06/16/2020   IR ANGIOGRAM SELECTIVE EACH ADDITIONAL VESSEL  06/16/2020   IR ANGIOGRAM SELECTIVE EACH ADDITIONAL VESSEL  06/16/2020   IR ANGIOGRAM SELECTIVE EACH ADDITIONAL VESSEL  06/16/2020   IR ANGIOGRAM SELECTIVE EACH ADDITIONAL VESSEL  06/16/2020   IR ANGIOGRAM SELECTIVE EACH ADDITIONAL VESSEL  06/16/2020   IR ANGIOGRAM SELECTIVE EACH ADDITIONAL VESSEL  06/30/2020   IR ANGIOGRAM SELECTIVE EACH ADDITIONAL VESSEL  06/30/2020   IR ANGIOGRAM VISCERAL SELECTIVE  06/16/2020   IR ANGIOGRAM VISCERAL SELECTIVE  06/30/2020   IR EMBO ARTERIAL NOT HEMORR HEMANG INC GUIDE ROADMAPPING  06/16/2020   IR EMBO TUMOR ORGAN ISCHEMIA INFARCT INC GUIDE ROADMAPPING  06/30/2020   IR EMBO VENOUS NOT HEMORR HEMANG  INC GUIDE ROADMAPPING  01/13/2021   IR IVUS EACH ADDITIONAL NON CORONARY VESSEL  01/13/2021   IR RADIOLOGIST EVAL & MGMT  05/06/2020   IR RADIOLOGIST EVAL & MGMT  05/20/2020   IR RADIOLOGIST EVAL & MGMT  05/25/2021   IR TIPS  01/13/2021   IR US GUIDE VASC ACCESS RIGHT  06/16/2020   IR US GUIDE VASC ACCESS RIGHT  06/30/2020   IR US GUIDE VASC ACCESS RIGHT  01/13/2021   LAPAROSCOPIC UMBILICAL HERNIA REPAIR W/ MESH  06-06-2011   LEFT HEART CATHETERIZATION WITH CORONARY ANGIOGRAM N/A 03/28/2014   Procedure: LEFT HEART CATHETERIZATION WITH CORONARY ANGIOGRAM;  Surgeon: Sinclair Grooms, MD;  Location: [facility] CATH LAB;  Service: Cardiovascular;  Laterality: N/A;   LIPOMA EXCISION Left 08/25/2017   Procedure: EXCISION LIPOMA LEFT POSTERIOR THIGH;  Surgeon: Clovis Riley, MD;  Location: [place];  Service: General;  Laterality: Left;   NEPHROLITHOTOMY  1990'S   OPEN APPENDECTOMY W/ PARTIAL CECECTOMY  05-16-2004   PERCUTANEOUS CORONARY STENT INTERVENTION (PCI-S) N/A 06/28/2012   Procedure: PERCUTANEOUS CORONARY STENT INTERVENTION (PCI-S);  Surgeon: Sherren Mocha, MD;  Location: [facility] CATH LAB;  Service: Cardiovascular;  Laterality: N/A;   POLYPECTOMY  11/20/2020   Procedure: POLYPECTOMY;  Surgeon:  Ronald Lobo, MD;  [facility];  Service: Endoscopy;;   POLYPECTOMY  05/02/2021   Procedure: POLYPECTOMY;  Surgeon: Arta Silence, MD;  [facility];  Service: Endoscopy;;   RADIOLOGY WITH ANESTHESIA N/A 01/13/2021   Procedure: IR WITH ANESTHESIA - TIPS;  Surgeon: Suzette Battiest, MD;  Location: [place];  Service: Radiology;  Laterality: N/A;     A IV Location/Drains/Wounds Patient Lines/Drains/Airways Status     Active Line/Drains/Airways     Name Placement date Placement time Site Days   Peripheral IV 09/08/21 18 G Left Antecubital 09/08/21  1710  Antecubital  1   Peripheral IV 09/08/21 18 G Right Antecubital 09/08/21  1932  Antecubital  1   Peripheral IV 09/08/21 22 G Left Wrist 09/08/21  2156  Wrist  1   Peripheral IV 09/09/21 20 G Posterior;Right Wrist 09/09/21  0955  Wrist  less than 1   External Urinary Catheter 07/24/21  0100  --  47  Intake/Output Last 24 hours  Intake/Output Summary (Last 24 hours) at 09/09/2021 1415 Last data filed at 09/09/2021 3785 Gross per 24 hour  Intake --  Output 1400 ml  Net -1400 ml    Labs/Imaging Results for orders placed or performed during the hospital encounter of 09/08/21 (from the past 48 hour(s))  Resp Panel by RT-PCR (Flu A&B, Covid) Nasopharyngeal Swab     Status: None   Collection Time: 09/08/21  5:08 PM   Specimen: Nasopharyngeal Swab; Nasopharyngeal(NP) swabs in vial transport medium  Result Value Ref Range   SARS Coronavirus 2 by RT PCR NEGATIVE NEGATIVE    Comment: (NOTE) SARS-CoV-2 target nucleic acids are NOT DETECTED.  The SARS-CoV-2 RNA is generally detectable in upper respiratory specimens during the acute phase of infection. The lowest concentration of SARS-CoV-2 viral copies this assay can detect is 138 copies/mL. A negative result does not preclude SARS-Cov-2 infection and should not be used as the sole basis for treatment or other patient management decisions. A negative result  may occur with  improper specimen collection/handling, submission of specimen other than nasopharyngeal swab, presence of viral mutation(s) within the areas targeted by this assay, and inadequate number of viral copies(<138 copies/mL). A negative result must be combined with clinical observations, patient history, and epidemiological information. The expected result is Negative.  Fact Sheet for Patients:  EntrepreneurPulse.com.au  Fact Sheet for Healthcare Providers:  IncredibleEmployment.be  This test is no t yet approved or cleared by the [place] FDA and  has been authorized for detection and/or diagnosis of SARS-CoV-2 by FDA under an Emergency Use Authorization (EUA). This EUA will remain  in effect (meaning this test can be used) for the duration of the COVID-19 declaration under Section 564(b)(1) of the Act, 21 U.S.C.section 360bbb-3(b)(1), unless the authorization is terminated  or revoked sooner.       Influenza A by PCR NEGATIVE NEGATIVE   Influenza B by PCR NEGATIVE NEGATIVE    Comment: (NOTE) The Xpert Xpress SARS-CoV-2/FLU/RSV plus assay is intended as an aid in the diagnosis of influenza from Nasopharyngeal swab specimens and should not be used as a sole basis for treatment. Nasal washings and aspirates are unacceptable for Xpert Xpress SARS-CoV-2/FLU/RSV testing.  Fact Sheet for Patients: EntrepreneurPulse.com.au  Fact Sheet for Healthcare Providers: IncredibleEmployment.be  This test is not yet approved or cleared by the [place] FDA and has been authorized for detection and/or diagnosis of SARS-CoV-2 by FDA under an Emergency Use Authorization (EUA). This EUA will remain in effect (meaning this test can be used) for the duration of the COVID-19 declaration under Section 564(b)(1) of the Act, 21 U.S.C. section 360bbb-3(b)(1), unless the authorization is terminated  or revoked.  Performed at [facility], [place] [address] [place], [place] [address]   Comprehensive metabolic panel     Status: Abnormal   Collection Time: 09/08/21  5:20 PM  Result Value Ref Range   Sodium 135 135 - 145 mmol/L   Potassium 3.8 3.5 - 5.1 mmol/L   Chloride 106 98 - 111 mmol/L   CO2 17 (L) 22 - 32 mmol/L   Glucose, Bld 129 (H) 70 - 99 mg/dL    Comment: Glucose reference range applies only to samples taken after fasting for at least 8 hours.   BUN 21 8 - 23 mg/dL   Creatinine, Ser 1.17 0.61 - 1.24 mg/dL   Calcium 8.1 (L) 8.9 - 10.3 mg/dL   Total Protein 5.3 (L) 6.5 - 8.1 g/dL  Albumin 2.3 (L) 3.5 - 5.0 g/dL   AST 42 (H) 15 - 41 U/L   ALT 22 0 - 44 U/L   Alkaline Phosphatase 115 38 - 126 U/L   Total Bilirubin 1.2 0.3 - 1.2 mg/dL   GFR, Estimated >60 >60 mL/min    Comment: (NOTE) Calculated using the CKD-EPI Creatinine Equation (2021)    Anion gap 12 5 - 15    Comment: Performed at [facility] [address] [place], [place] [address]  Ethanol     Status: None   Collection Time: 09/08/21  5:20 PM  Result Value Ref Range   Alcohol, Ethyl (B) <10 <10 mg/dL    Comment: (NOTE) Lowest detectable limit for serum alcohol is 10 mg/dL.  For medical purposes only. Performed at [facility], [place] [address], [place], [place] [address]   Lactic acid, plasma     Status: Abnormal   Collection Time: 09/08/21  5:20 PM  Result Value Ref Range   Lactic Acid, Venous 2.7 (HH) 0.5 - 1.9 mmol/L    Comment: CRITICAL RESULT CALLED TO, READ BACK BY AND VERIFIED WITH: Micah Flesher RN [phone] 09/08/2021 BY Zenon Mayo Performed at [facility], [place] [address] [place], [place] [address]   Lipase, blood     Status: None   Collection Time: 09/08/21  5:20 PM  Result Value Ref Range   Lipase 32 11 - 51 U/L    Comment: Performed at [facility] [address] [place], [place] [address]  CBC with Differential     Status: Abnormal   Collection Time: 09/08/21   5:20 PM  Result Value Ref Range   WBC 2.7 (L) 4.0 - 10.5 K/uL   RBC 2.01 (L) 4.22 - 5.81 MIL/uL   Hemoglobin 5.2 (LL) 13.0 - 17.0 g/dL    Comment: REPEATED TO VERIFY THIS CRITICAL RESULT HAS VERIFIED AND BEEN CALLED TO CONNOR LOZER RN BY CAROL PHILLIPS ON 01 11 2023 AT 1832, AND HAS BEEN READ BACK.     HCT 17.2 (L) 39.0 - 52.0 %   MCV 85.6 80.0 - 100.0 fL   MCH 25.9 (L) 26.0 - 34.0 pg   MCHC 30.2 30.0 - 36.0 g/dL   RDW 16.4 (H) 11.5 - 15.5 %   Platelets 98 (L) 150 - 400 K/uL    Comment: Immature Platelet Fraction may be clinically indicated, consider ordering this additional test [id number] REPEATED TO VERIFY PLATELET COUNT CONFIRMED BY SMEAR    nRBC 0.0 0.0 - 0.2 %   Neutrophils Relative % 77 %   Neutro Abs 2.1 1.7 - 7.7 K/uL   Lymphocytes Relative 14 %   Lymphs Abs 0.4 (L) 0.7 - 4.0 K/uL   Monocytes Relative 8 %   Monocytes Absolute 0.2 0.1 - 1.0 K/uL   Eosinophils Relative 1 %   Eosinophils Absolute 0.0 0.0 - 0.5 K/uL   Basophils Relative 0 %   Basophils Absolute 0.0 0.0 - 0.1 K/uL   Immature Granulocytes 0 %   Abs Immature Granulocytes 0.01 0.00 - 0.07 K/uL    Comment: Performed at [facility], [address] [place], [place] [address]  Ammonia     Status: Abnormal   Collection Time: 09/08/21  5:20 PM  Result Value Ref Range   Ammonia 137 (H) 9 - 35 umol/L    Comment: Performed at [facility], [place] [address], [place], [place] [address]  Lactic acid, plasma  Status: Abnormal   Collection Time: 09/08/21  7:07 PM  Result Value Ref Range   Lactic Acid, Venous 2.0 (HH) 0.5 - 1.9 mmol/L    Comment: CRITICAL VALUE NOTED.  VALUE IS CONSISTENT WITH PREVIOUSLY REPORTED AND CALLED VALUE. Performed at [facility], [place] [address], [place], [place] [address]   Type and screen [place]     Status: None (Preliminary result)   Collection Time: 09/08/21  7:26 PM  Result Value Ref Range   ABO/RH(D) O POS    Antibody Screen NEG    Sample  Expiration 09/11/2021,2359    Unit Number [id number]    Blood Component Type RED CELLS,LR    Unit division 00    Status of Unit ISSUED,FINAL    Transfusion Status OK TO TRANSFUSE    Crossmatch Result      Compatible Performed at [facility], [place] [address], [place], [place] [address]    Unit Number [id number]    Blood Component Type RED CELLS,LR    Unit division 00    Status of Unit ISSUED    Transfusion Status OK TO TRANSFUSE    Crossmatch Result Compatible    Unit Number [id number]    Blood Component Type RED CELLS,LR    Unit division 00    Status of Unit ISSUED    Transfusion Status OK TO TRANSFUSE    Crossmatch Result Compatible   POC occult blood, ED     Status: Abnormal   Collection Time: 09/08/21  7:31 PM  Result Value Ref Range   Fecal Occult Bld POSITIVE (A) NEGATIVE  Prepare RBC (crossmatch)     Status: None   Collection Time: 09/08/21  8:50 PM  Result Value Ref Range   Order Confirmation      ORDER PROCESSED BY BLOOD BANK Performed at [facility], [place] [address], [place], [place] [address]   Urinalysis, Routine w reflex microscopic     Status: Abnormal   Collection Time: 09/09/21 12:44 AM  Result Value Ref Range   Color, Urine YELLOW YELLOW   APPearance CLEAR CLEAR   Specific Gravity, Urine 1.015 1.005 - 1.030   pH 7.0 5.0 - 8.0   Glucose, UA NEGATIVE NEGATIVE mg/dL   Hgb urine dipstick NEGATIVE NEGATIVE   Bilirubin Urine NEGATIVE NEGATIVE   Ketones, ur NEGATIVE NEGATIVE mg/dL   Protein, ur NEGATIVE NEGATIVE mg/dL   Nitrite NEGATIVE NEGATIVE   Leukocytes,Ua SMALL (A) NEGATIVE    Comment: Performed at [facility] [address], [place], [place] [address]  Urinalysis, Microscopic (reflex)     Status: Abnormal   Collection Time: 09/09/21 12:44 AM  Result Value Ref Range   RBC / HPF 0-5 0 - 5 RBC/hpf   WBC, UA 21-50 0 - 5 WBC/hpf   Bacteria, UA MANY (A) NONE SEEN   Squamous Epithelial / LPF 0-5 0 - 5   Hyaline Casts, UA  PRESENT     Comment: Performed at [facility], [place] [address], [place], [place] [address]  Magnesium     Status: None   Collection Time: 09/09/21  3:09 AM  Result Value Ref Range   Magnesium 2.1 1.7 - 2.4 mg/dL    Comment: Performed at [facility] [address] [place], [place] [address]  Magnesium     Status: None   Collection Time: 09/09/21  3:09 AM  Result Value Ref Range   Magnesium 2.1 1.7 - 2.4  mg/dL    Comment: Performed at [facility], [place] [address] [place], [place] [address]  Comprehensive metabolic panel     Status: Abnormal   Collection Time: 09/09/21  3:09 AM  Result Value Ref Range   Sodium 133 (L) 135 - 145 mmol/L   Potassium 3.8 3.5 - 5.1 mmol/L   Chloride 109 98 - 111 mmol/L   CO2 19 (L) 22 - 32 mmol/L   Glucose, Bld 111 (H) 70 - 99 mg/dL    Comment: Glucose reference range applies only to samples taken after fasting for at least 8 hours.   BUN 20 8 - 23 mg/dL   Creatinine, Ser 1.15 0.61 - 1.24 mg/dL   Calcium 7.8 (L) 8.9 - 10.3 mg/dL   Total Protein 5.1 (L) 6.5 - 8.1 g/dL   Albumin 2.1 (L) 3.5 - 5.0 g/dL   AST 34 15 - 41 U/L   ALT 21 0 - 44 U/L   Alkaline Phosphatase 120 38 - 126 U/L   Total Bilirubin 1.4 (H) 0.3 - 1.2 mg/dL   GFR, Estimated >60 >60 mL/min    Comment: (NOTE) Calculated using the CKD-EPI Creatinine Equation (2021)    Anion gap 5 5 - 15    Comment: Performed at [facility], [place] [address], [place], [place] [address]  CBC with Differential/Platelet     Status: Abnormal   Collection Time: 09/09/21  3:09 AM  Result Value Ref Range   WBC 2.7 (L) 4.0 - 10.5 K/uL   RBC 2.25 (L) 4.22 - 5.81 MIL/uL   Hemoglobin 5.9 (LL) 13.0 - 17.0 g/dL    Comment: CRITICAL VALUE NOTED.  VALUE IS CONSISTENT WITH PREVIOUSLY REPORTED AND CALLED VALUE. REPEATED TO VERIFY    HCT 19.3 (L) 39.0 - 52.0 %   MCV 85.8 80.0 - 100.0 fL   MCH 26.2 26.0 - 34.0 pg   MCHC 30.6 30.0 - 36.0 g/dL   RDW 15.9 (H) 11.5 - 15.5 %   Platelets 85 (L)  150 - 400 K/uL    Comment: Immature Platelet Fraction may be clinically indicated, consider ordering this additional test [id number] CONSISTENT WITH PREVIOUS RESULT REPEATED TO VERIFY    nRBC 0.0 0.0 - 0.2 %   Neutrophils Relative % 73 %   Neutro Abs 1.9 1.7 - 7.7 K/uL   Lymphocytes Relative 17 %   Lymphs Abs 0.5 (L) 0.7 - 4.0 K/uL   Monocytes Relative 9 %   Monocytes Absolute 0.3 0.1 - 1.0 K/uL   Eosinophils Relative 1 %   Eosinophils Absolute 0.0 0.0 - 0.5 K/uL   Basophils Relative 0 %   Basophils Absolute 0.0 0.0 - 0.1 K/uL   Immature Granulocytes 0 %   Abs Immature Granulocytes 0.01 0.00 - 0.07 K/uL    Comment: Performed at [facility], [address], [place], [place] [address]  Protime-INR     Status: Abnormal   Collection Time: 09/09/21  3:09 AM  Result Value Ref Range   Prothrombin Time 15.8 (H) 11.4 - 15.2 seconds   INR 1.3 (H) 0.8 - 1.2    Comment: (NOTE) INR goal varies based on device and disease states. Performed at [facility], [place] [address], [place], [place] [address]   Hemoglobin and hematocrit, blood     Status: Abnormal   Collection Time: 09/09/21  3:09 AM  Result Value Ref Range   Hemoglobin 5.8 (LL) 13.0 - 17.0 g/dL    Comment: CRITICAL  VALUE NOTED.  VALUE IS CONSISTENT WITH PREVIOUSLY REPORTED AND CALLED VALUE. REPEATED TO VERIFY    HCT 19.9 (L) 39.0 - 52.0 %    Comment: Performed at [facility], [place] [address] [place], [place] [address]  Iron and TIBC     Status: Abnormal   Collection Time: 09/09/21  3:09 AM  Result Value Ref Range   Iron 39 (L) 45 - 182 ug/dL   TIBC 328 250 - 450 ug/dL   Saturation Ratios 12 (L) 17.9 - 39.5 %   UIBC 289 ug/dL    Comment: Performed at [facility], [place] [address], [place], [place] [address]  Reticulocytes     Status: Abnormal   Collection Time: 09/09/21  3:09 AM  Result Value Ref Range   Retic Ct Pct 2.4 0.4 - 3.1 %   RBC. 2.28 (L) 4.22 - 5.81 MIL/uL   Retic Count, Absolute 54.5  19.0 - 186.0 K/uL   Immature Retic Fract 32.6 (H) 2.3 - 15.9 %    Comment: Performed at [facility] [address] [place], [place] [address]  Ferritin     Status: Abnormal   Collection Time: 09/09/21  3:09 AM  Result Value Ref Range   Ferritin 15 (L) 24 - 336 ng/mL    Comment: Performed at [facility], [place] [address], [place], [place] [address]  Folate     Status: None   Collection Time: 09/09/21  3:09 AM  Result Value Ref Range   Folate 11.2 >5.9 ng/mL    Comment: Performed at [facility], [place] [address] [place], [place] [address]  TSH     Status: None   Collection Time: 09/09/21  3:09 AM  Result Value Ref Range   TSH 1.210 0.350 - 4.500 uIU/mL    Comment: Performed by a 3rd Generation assay with a functional sensitivity of <=0.01 uIU/mL. Performed at [facility], [place] [address], [place], [place] [address]   Prepare RBC (crossmatch)     Status: None   Collection Time: 09/09/21  7:04 AM  Result Value Ref Range   Order Confirmation      ORDER PROCESSED BY BLOOD BANK Performed at [facility], [place] [address], [place], [place] [address]    DG Chest Port 1 View  Result Date: 09/08/2021 CLINICAL DATA:  Weakness, confused, disoriented, history of CHF EXAM: PORTABLE CHEST 1 VIEW COMPARISON:  Chest radiograph 05/28/2021 FINDINGS: Median sternotomy wires and mediastinal surgical clips are again noted. The heart is enlarged, unchanged. The mediastinal contours are grossly stable. There are diffusely increased interstitial markings throughout both lungs along with central vascular congestion. There is no focal consolidation. There is no significant pleural effusion. There is no pneumothorax There is no acute osseous abnormality. IMPRESSION: Cardiomegaly with probable mild to moderate pulmonary interstitial edema. No focal consolidation or significant pleural effusion. Electronically Signed   By: Valetta Mole M.D.   On: 09/08/2021 18:10    Pending  Labs Unresulted Labs (From admission, onward)     Start     Ordered   09/10/21 0500  Comprehensive metabolic panel  Daily,   R      09/09/21 1403   09/10/21 0500  Ammonia  Daily,   R      09/09/21 1403   09/09/21 1404  CBC with Differential/Platelet  5A & 5P,   R (with TIMED occurrences)      09/09/21 1403   09/08/21  2242  Methylmalonic acid, serum  Add-on,   AD        09/08/21 2241            Vitals/Pain Today's Vitals   09/09/21 1010 09/09/21 1230 09/09/21 1245 09/09/21 1300  BP: (!) 124/51 109/70 122/61 124/84  Pulse: 70 68 71 74  Resp: 19 13 16 19   Temp: 97.9 F (36.6 C)  98.5 F (36.9 C)   TempSrc: Oral     SpO2: 100% 99% 99% 98%  Weight:      Height:      PainSc:        Isolation Precautions No active isolations  Medications Medications  acetaminophen (TYLENOL) tablet 650 mg (has no administration in time range)    Or  acetaminophen (TYLENOL) suppository 650 mg (has no administration in time range)  albuterol (PROVENTIL) (2.5 MG/3ML) 0.083% nebulizer solution 2.5 mg (has no administration in time range)  umeclidinium-vilanterol (ANORO ELLIPTA) 62.5-25 MCG/ACT 1 puff (1 puff Inhalation Given 09/09/21 0950)  rifaximin (XIFAXAN) tablet 550 mg (550 mg Oral Given 09/09/21 0950)  furosemide (LASIX) tablet 40 mg (40 mg Oral Given 09/09/21 0950)  spironolactone (ALDACTONE) tablet 50 mg (50 mg Oral Given 09/09/21 0951)  pantoprazole (PROTONIX) EC tablet 40 mg (has no administration in time range)  ferrous sulfate tablet 325 mg (has no administration in time range)  lactulose (CHRONULAC) 10 GM/15ML solution 30 g (has no administration in time range)  lactulose (CHRONULAC) 10 GM/15ML solution 10 g (10 g Oral Given 09/09/21 0031)  0.9 %  sodium chloride infusion (0 mL/hr Intravenous Stopped 09/09/21 1320)  octreotide (SANDOSTATIN) 2 mcg/mL load via infusion 50 mcg (50 mcg Intravenous Bolus from Bag 09/08/21 2320)  pantoprazole (PROTONIX) 80 mg /NS 100 mL IVPB (0 mg  Intravenous Stopped 09/09/21 0222)  furosemide (LASIX) injection 20 mg (20 mg Intravenous Given 09/09/21 0326)    Mobility walks with person assist     Focused Assessments Neuro Assessment Handoff:  Swallow screen pass? Yes  Cardiac Rhythm: Normal sinus rhythm       Neuro Assessment: Exceptions to WDL Neuro Checks:      Last Documented NIHSS Modified Score:   Has TPA been given? No If patient is a Neuro Trauma and patient is going to OR before floor call report to [place] nurse: [phone] or [phone]   R Recommendations: See Admitting Provider Note  Report given to:   Additional Notes: none

## 2021-09-09 NOTE — ED Notes (Signed)
Blood finished infusing at 1230. Will draw H&H at 1430

## 2021-09-09 NOTE — Progress Notes (Signed)
PROGRESS NOTE    [name]  GXQ:[id number] DOB: [birth year] DOA: 09/08/2021 PCP: [name], MD   Brief Narrative:  [name] is a 76 y.o. male with medical history significant for hepatocellular carcinoma, cirrhosis complicated by portal hypertension, chronic pancytopenia, upper GI bleed, GERD, who is admitted to [facility] on 09/08/2021 with suspected acute upper gastrointestinal bleed after presenting from home to [facility] ED complaining of generalized weakness.   Assessment & Plan:   Principal Problem:   Acute upper GI bleed Active Problems:   Acute on chronic anemia   Cirrhosis (HCC)   Lactic acidosis   GERD (gastroesophageal reflux disease)   Chronic diastolic CHF (congestive heart failure) (HCC)   Generalized weakness  Possible acute upper GI bleed/acute blood loss anemia/generalized weakness: Weakness secondary to acute anemia.  Presented with hemoglobin of 5.2.  2 units of PRBC transfusion has been ordered.  We will check hemoglobin after transfusion.  Per GI recommendations, he was started on Protonix as well as octreotide drip.  No history of hematemesis lately however per GI note, patient's recent EGD was done on 05/18/2021 by Dr. [name] and that showed Normal esophagus, portal hypertensive gastropathy, type I isolated gastric varices without bleeding, multiple nonbleeding angioectasias in the stomach, few small proximal duodenal AVMs.  Estimated over 15 gastric AVMs as well as mild to moderate gastropathy.  Colonoscopy showed Hemorrhoids, ulcerated mucosa in the rectum and rectosigmoid, diverticulosis in sigmoid and descending colon, single nonbleeding colonic angiectasia (clip was placed), few nonbleeding colonic angioectasias.  So basically patient has several findings on the EGD and colonoscopy which can be the cause of his acute blood loss anemia/GI bleed.  Per GI note, patient's hemoglobin drop is likely secondary to him not taking oral iron.  I am not sure  about that information because patient has remained encephalopathic since yesterday and not sure how this information was obtained.  Nonetheless, I find it hard for patient's hemoglobin to drop all the way from 9.0 just 3 weeks ago on 08/12/2021 to 5.2 this admission.  Having said that, since we have GI on board so I will defer management of GI bleed to them and will defer any decision for scopes to them as well.  They have transitioned him to twice daily IV Protonix and stop the drips.  We will monitor H&H every 12 hours.  He has been started on oral iron.  History of liver cirrhosis: Continue Lasix, rifaximin and Aldactone.  Acute metabolic encephalopathy: Patient still slightly lethargic and confused.  Ammonia 137 which was only 44 about 6 weeks ago.  He is supposed to take 30 g of lactulose 3 times daily.  We will resume that.  Monitor ammonia tomorrow.  DVT prophylaxis: SCDs Start: 09/08/21 2212   Code Status: Full Code  Family Communication: None present at bedside.    Status is: Observation  The patient will require care spanning > 2 midnights and should be moved to inpatient because: Hepatic encephalopathy, confused with severe anemia.   Estimated body mass index is 31.42 kg/m as calculated from the following:   Height as of this encounter: 5\' 7"  (1.702 m).   Weight as of this encounter: 91 kg.    Nutritional Assessment: Body mass index is 31.42 kg/m.Marland Kitchen Seen by dietician.  I agree with the assessment and plan as outlined below: Nutrition Status:        . Skin Assessment: I have examined the patient's skin and I agree with the wound  assessment as performed by the wound care RN as outlined below:    Consultants:  GI  Procedures:  None  Antimicrobials:  Anti-infectives (From admission, onward)    Start     Dose/Rate Route Frequency Ordered Stop   09/09/21 1000  rifaximin (XIFAXAN) tablet 550 mg        550 mg Oral 2 times daily 09/09/21 0801     09/08/21 2230   cefTRIAXone (ROCEPHIN) 2 g in sodium chloride 0.9 % 100 mL IVPB  Status:  Discontinued        2 g 200 mL/hr over 30 Minutes Intravenous Every 24 hours 09/08/21 2215 09/09/21 1156         Subjective: Patient seen and examined.  He was lethargic and confused.  Objective: Vitals:   09/09/21 1010 09/09/21 1230 09/09/21 1245 09/09/21 1300  BP: (!) 124/51 109/70 122/61 124/84  Pulse: 70 68 71 74  Resp: 19 13 16 19   Temp: 97.9 F (36.6 C)  98.5 F (36.9 C)   TempSrc: Oral     SpO2: 100% 99% 99% 98%  Weight:      Height:        Intake/Output Summary (Last 24 hours) at 09/09/2021 1353 Last data filed at 09/09/2021 0727 Gross per 24 hour  Intake --  Output 1400 ml  Net -1400 ml   Filed Weights   09/08/21 1713  Weight: 91 kg    Examination:  General exam: Appears calm and comfortable but slightly lethargic and confused Respiratory system: Clear to auscultation. Respiratory effort normal. Cardiovascular system: S1 & S2 heard, RRR. No JVD, murmurs, rubs, gallops or clicks. No pedal edema. Gastrointestinal system: Abdomen is nondistended, soft and nontender. No organomegaly or masses felt. Normal bowel sounds heard. Central nervous system: Lethargic and confused. No focal neurological deficits. Extremities: Symmetric 5 x 5 power. Skin: No rashes, lesions or ulcers  Data Reviewed: I have personally reviewed following labs and imaging studies  CBC: Recent Labs  Lab 09/08/21 1720 09/09/21 0309  WBC 2.7* 2.7*  NEUTROABS 2.1 1.9  HGB 5.2* 5.8*   5.9*  HCT 17.2* 19.9*   19.3*  MCV 85.6 85.8  PLT 98* 85*   Basic Metabolic Panel: Recent Labs  Lab 09/08/21 1720 09/09/21 0309  NA 135 133*  K 3.8 3.8  CL 106 109  CO2 17* 19*  GLUCOSE 129* 111*  BUN 21 20  CREATININE 1.17 1.15  CALCIUM 8.1* 7.8*  MG  --  2.1   2.1   GFR: Estimated Creatinine Clearance: 59.7 mL/min (by C-G formula based on SCr of 1.15 mg/dL). Liver Function Tests: Recent Labs  Lab 09/08/21 1720  09/09/21 0309  AST 42* 34  ALT 22 21  ALKPHOS 115 120  BILITOT 1.2 1.4*  PROT 5.3* 5.1*  ALBUMIN 2.3* 2.1*   Recent Labs  Lab 09/08/21 1720  LIPASE 32   Recent Labs  Lab 09/08/21 1720  AMMONIA 137*   Coagulation Profile: Recent Labs  Lab 09/09/21 0309  INR 1.3*   Cardiac Enzymes: No results for input(s): CKTOTAL, CKMB, CKMBINDEX, TROPONINI in the last 168 hours. BNP (last 3 results) No results for input(s): PROBNP in the last 8760 hours. HbA1C: No results for input(s): HGBA1C in the last 72 hours. CBG: No results for input(s): GLUCAP in the last 168 hours. Lipid Profile: No results for input(s): CHOL, HDL, LDLCALC, TRIG, CHOLHDL, LDLDIRECT in the last 72 hours. Thyroid Function Tests: Recent Labs    09/09/21 0309  TSH 1.210  Anemia Panel: Recent Labs    09/09/21 0309  FOLATE 11.2  FERRITIN 15*  TIBC 328  IRON 39*  RETICCTPCT 2.4   Sepsis Labs: Recent Labs  Lab 09/08/21 1720 09/08/21 1907  LATICACIDVEN 2.7* 2.0*    Recent Results (from the past 240 hour(s))  Resp Panel by RT-PCR (Flu A&B, Covid) Nasopharyngeal Swab     Status: None   Collection Time: 09/08/21  5:08 PM   Specimen: Nasopharyngeal Swab; Nasopharyngeal(NP) swabs in vial transport medium  Result Value Ref Range Status   SARS Coronavirus 2 by RT PCR NEGATIVE NEGATIVE Final    Comment: (NOTE) SARS-CoV-2 target nucleic acids are NOT DETECTED.  The SARS-CoV-2 RNA is generally detectable in upper respiratory specimens during the acute phase of infection. The lowest concentration of SARS-CoV-2 viral copies this assay can detect is 138 copies/mL. A negative result does not preclude SARS-Cov-2 infection and should not be used as the sole basis for treatment or other patient management decisions. A negative result may occur with  improper specimen collection/handling, submission of specimen other than nasopharyngeal swab, presence of viral mutation(s) within the areas targeted by this  assay, and inadequate number of viral copies(<138 copies/mL). A negative result must be combined with clinical observations, patient history, and epidemiological information. The expected result is Negative.  Fact Sheet for Patients:  EntrepreneurPulse.com.au  Fact Sheet for Healthcare Providers:  IncredibleEmployment.be  This test is no t yet approved or cleared by the [place] FDA and  has been authorized for detection and/or diagnosis of SARS-CoV-2 by FDA under an Emergency Use Authorization (EUA). This EUA will remain  in effect (meaning this test can be used) for the duration of the COVID-19 declaration under Section 564(b)(1) of the Act, 21 U.S.C.section 360bbb-3(b)(1), unless the authorization is terminated  or revoked sooner.       Influenza A by PCR NEGATIVE NEGATIVE Final   Influenza B by PCR NEGATIVE NEGATIVE Final    Comment: (NOTE) The Xpert Xpress SARS-CoV-2/FLU/RSV plus assay is intended as an aid in the diagnosis of influenza from Nasopharyngeal swab specimens and should not be used as a sole basis for treatment. Nasal washings and aspirates are unacceptable for Xpert Xpress SARS-CoV-2/FLU/RSV testing.  Fact Sheet for Patients: EntrepreneurPulse.com.au  Fact Sheet for Healthcare Providers: IncredibleEmployment.be  This test is not yet approved or cleared by the [place] FDA and has been authorized for detection and/or diagnosis of SARS-CoV-2 by FDA under an Emergency Use Authorization (EUA). This EUA will remain in effect (meaning this test can be used) for the duration of the COVID-19 declaration under Section 564(b)(1) of the Act, 21 U.S.C. section 360bbb-3(b)(1), unless the authorization is terminated or revoked.  Performed at [facility], [place] [address], [place], [place] [address]      Radiology Studies: DG Chest [place] 1 View  Result Date: 09/08/2021 CLINICAL  DATA:  Weakness, confused, disoriented, history of CHF EXAM: PORTABLE CHEST 1 VIEW COMPARISON:  Chest radiograph 05/28/2021 FINDINGS: Median sternotomy wires and mediastinal surgical clips are again noted. The heart is enlarged, unchanged. The mediastinal contours are grossly stable. There are diffusely increased interstitial markings throughout both lungs along with central vascular congestion. There is no focal consolidation. There is no significant pleural effusion. There is no pneumothorax There is no acute osseous abnormality. IMPRESSION: Cardiomegaly with probable mild to moderate pulmonary interstitial edema. No focal consolidation or significant pleural effusion. Electronically Signed   By: Valetta Mole M.D.   On: 09/08/2021 18:10  Scheduled Meds:  ferrous sulfate  325 mg Oral BID WC   furosemide  40 mg Oral Daily   pantoprazole  40 mg Oral BID AC   rifaximin  550 mg Oral BID   spironolactone  50 mg Oral Daily   umeclidinium-vilanterol  1 puff Inhalation Daily   Continuous Infusions:   LOS: 0 days   Time spent: 37 minutes  Darliss Cheney, MD [facility]  09/09/2021, 1:53 PM  Please page via [place] and do not message via secure chat for anything urgent. Secure chat can be used for anything non urgent.  How to contact the [facility] Attending or Consulting provider [place] or covering provider during after hours [place], for this patient?  Check the care team in [facility] and look for a) attending/consulting [facility] provider listed and b) the [facility] team listed. Page or secure chat [phone]. Log into [email or web address] and use [place]'s universal password to access. If you do not have the password, please contact the hospital operator. Locate the [facility] provider you are looking for under [facility] and page to a number that you can be directly reached. If you still have difficulty reaching the provider, please page the [facility] (Director on Call) for the Hospitalists listed on amion for assistance.

## 2021-09-09 NOTE — Evaluation (Signed)
Physical Therapy Evaluation Patient Details Name: [name] MRN: [id number] DOB: [birth year] Today's Date: 09/09/2021  History of Present Illness  Pt is a 76 y/o male admitted secondary to weakness and possible upper GI bleed. PMH includes liver cancer, CHF, DM, HTN, and CAD.  Clinical Impression  Pt admitted secondary to problem above with deficits below. Pt requiring mod A for transfers and to take side steps at EOB. Pt with difficulty sequencing and requiring multimodal cues throughout. Per notes, has been in bed at home for the past few days and with significant weakness. Recommending SNF level therapies to address current deficits. Will continue to follow acutely.        Recommendations for follow up therapy are one component of a multi-disciplinary discharge planning process, led by the attending physician.  Recommendations may be updated based on patient status, additional functional criteria and insurance authorization.  Follow Up Recommendations Skilled nursing-short term rehab (<3 hours/day)    Assistance Recommended at Discharge Frequent or constant Supervision/Assistance  Patient can return home with the following  A lot of help with walking and/or transfers;A lot of help with bathing/dressing/bathroom    Equipment Recommendations Wheelchair (measurements PT);Wheelchair cushion (measurements PT);Hospital bed  Recommendations for Other Services       Functional Status Assessment Patient has had a recent decline in their functional status and demonstrates the ability to make significant improvements in function in a reasonable and predictable amount of time.     Precautions / Restrictions Precautions Precautions: Fall Restrictions Weight Bearing Restrictions: No      Mobility  Bed Mobility Overal bed mobility: Needs Assistance Bed Mobility: Supine to Sit;Sit to Supine     Supine to sit: Min assist Sit to supine: Mod assist   General bed mobility comments:  Required assist for trunk and LEs. Increased time required.    Transfers Overall transfer level: Needs assistance Equipment used: 1 person hand held assist Transfers: Sit to/from Stand Sit to Stand: Mod assist           General transfer comment: Mod A for lift assist and steadying to stand. Pt requiring multimodal cues to sequence to take side steps at EOB. REquired mod A for steadying    Ambulation/Gait                  Stairs            Wheelchair Mobility    Modified Rankin (Stroke Patients Only)       Balance Overall balance assessment: Needs assistance Sitting-balance support: No upper extremity supported;Feet supported Sitting balance-Leahy Scale: Fair     Standing balance support: Bilateral upper extremity supported Standing balance-Leahy Scale: Poor Standing balance comment: Reliant on BUE support and external support                             Pertinent Vitals/Pain Pain Assessment: No/denies pain    Home Living Family/patient expects to be discharged to:: Private residence Living Arrangements: Spouse/significant other Available Help at Discharge: Family Type of Home: House Home Access: Stairs to enter Entrance Stairs-Rails: Left Entrance Stairs-Number of Steps: 1   Home Layout: Two level;Able to live on main level with bedroom/bathroom Home Equipment: Rolling Walker (2 wheels);BSC/3in1;Cane - single point;Wheelchair - manual      Prior Function Prior Level of Function : Patient poor historian/Family not available             Mobility Comments: Use RW  for mobility ADLs Comments: reports independence, but anticipate pt required assist     Hand Dominance        Extremity/Trunk Assessment   Upper Extremity Assessment Upper Extremity Assessment: Defer to OT evaluation    Lower Extremity Assessment Lower Extremity Assessment: Generalized weakness (increased swelling in bilateral feet and ankles)    Cervical /  Trunk Assessment Cervical / Trunk Assessment: Kyphotic  Communication   Communication: No difficulties  Cognition Arousal/Alertness: Awake/alert Behavior During Therapy: Flat affect Overall Cognitive Status: No family/caregiver present to determine baseline cognitive functioning                                 General Comments: Pt with slow processing and decreased awareness of safety and deficits. Difficulty sequencing and required multimodal cues.        General Comments      Exercises     Assessment/Plan    PT Assessment Patient needs continued PT services  PT Problem List Decreased strength;Decreased activity tolerance;Decreased balance;Decreased mobility;Decreased knowledge of use of DME;Decreased knowledge of precautions;Decreased safety awareness;Decreased cognition       PT Treatment Interventions DME instruction;Gait training;Therapeutic activities;Functional mobility training;Therapeutic exercise;Balance training;Patient/family education    PT Goals (Current goals can be found in the Care Plan section)  Acute Rehab PT Goals Patient Stated Goal: to go home PT Goal Formulation: With patient Time For Goal Achievement: 09/23/21 Potential to Achieve Goals: Fair    Frequency Min 2X/week     Co-evaluation               AM-PAC PT "6 Clicks" Mobility  Outcome Measure Help needed turning from your back to your side while in a flat bed without using bedrails?: A Little Help needed moving from lying on your back to sitting on the side of a flat bed without using bedrails?: A Little Help needed moving to and from a bed to a chair (including a wheelchair)?: A Lot Help needed standing up from a chair using your arms (e.g., wheelchair or bedside chair)?: A Lot Help needed to walk in hospital room?: A Lot Help needed climbing 3-5 steps with a railing? : Total 6 Click Score: 13    End of Session Equipment Utilized During Treatment: Gait belt Activity  Tolerance: Patient tolerated treatment well Patient left: in bed;with call bell/phone within reach (on stretcher in ED) Nurse Communication: Mobility status PT Visit Diagnosis: Unsteadiness on feet (R26.81);Muscle weakness (generalized) (M62.81)    Time: 9326-7124 PT Time Calculation (min) (ACUTE ONLY): 15 min   Charges:   PT Evaluation $PT Eval Moderate Complexity: 1 Mod          Reuel Derby, PT, DPT  Acute Rehabilitation Services  Pager: [phone] Office: [phone]   Rudean Hitt 09/09/2021, 12:48 PM

## 2021-09-10 ENCOUNTER — Observation Stay (HOSPITAL_COMMUNITY): Payer: Medicare HMO

## 2021-09-10 DIAGNOSIS — E7220 Disorder of urea cycle metabolism, unspecified: Secondary | ICD-10-CM | POA: Diagnosis not present

## 2021-09-10 DIAGNOSIS — J811 Chronic pulmonary edema: Secondary | ICD-10-CM | POA: Diagnosis not present

## 2021-09-10 DIAGNOSIS — D61818 Other pancytopenia: Secondary | ICD-10-CM | POA: Diagnosis not present

## 2021-09-10 DIAGNOSIS — E872 Acidosis, unspecified: Secondary | ICD-10-CM | POA: Diagnosis not present

## 2021-09-10 DIAGNOSIS — E876 Hypokalemia: Secondary | ICD-10-CM | POA: Diagnosis not present

## 2021-09-10 DIAGNOSIS — I700 Atherosclerosis of aorta: Secondary | ICD-10-CM | POA: Diagnosis not present

## 2021-09-10 DIAGNOSIS — Z7401 Bed confinement status: Secondary | ICD-10-CM | POA: Diagnosis not present

## 2021-09-10 DIAGNOSIS — I5032 Chronic diastolic (congestive) heart failure: Secondary | ICD-10-CM | POA: Diagnosis present

## 2021-09-10 DIAGNOSIS — E669 Obesity, unspecified: Secondary | ICD-10-CM | POA: Diagnosis present

## 2021-09-10 DIAGNOSIS — J910 Malignant pleural effusion: Secondary | ICD-10-CM | POA: Diagnosis not present

## 2021-09-10 DIAGNOSIS — D649 Anemia, unspecified: Secondary | ICD-10-CM | POA: Diagnosis not present

## 2021-09-10 DIAGNOSIS — R0602 Shortness of breath: Secondary | ICD-10-CM | POA: Diagnosis not present

## 2021-09-10 DIAGNOSIS — R4182 Altered mental status, unspecified: Secondary | ICD-10-CM | POA: Diagnosis not present

## 2021-09-10 DIAGNOSIS — I110 Hypertensive heart disease with heart failure: Secondary | ICD-10-CM | POA: Diagnosis present

## 2021-09-10 DIAGNOSIS — R918 Other nonspecific abnormal finding of lung field: Secondary | ICD-10-CM | POA: Diagnosis not present

## 2021-09-10 DIAGNOSIS — Z66 Do not resuscitate: Secondary | ICD-10-CM | POA: Diagnosis not present

## 2021-09-10 DIAGNOSIS — R911 Solitary pulmonary nodule: Secondary | ICD-10-CM | POA: Diagnosis not present

## 2021-09-10 DIAGNOSIS — K7460 Unspecified cirrhosis of liver: Secondary | ICD-10-CM | POA: Diagnosis not present

## 2021-09-10 DIAGNOSIS — E119 Type 2 diabetes mellitus without complications: Secondary | ICD-10-CM | POA: Diagnosis present

## 2021-09-10 DIAGNOSIS — Z20822 Contact with and (suspected) exposure to covid-19: Secondary | ICD-10-CM | POA: Diagnosis not present

## 2021-09-10 DIAGNOSIS — I864 Gastric varices: Secondary | ICD-10-CM | POA: Diagnosis not present

## 2021-09-10 DIAGNOSIS — D62 Acute posthemorrhagic anemia: Secondary | ICD-10-CM | POA: Diagnosis not present

## 2021-09-10 DIAGNOSIS — I517 Cardiomegaly: Secondary | ICD-10-CM | POA: Diagnosis not present

## 2021-09-10 DIAGNOSIS — J90 Pleural effusion, not elsewhere classified: Secondary | ICD-10-CM | POA: Diagnosis not present

## 2021-09-10 DIAGNOSIS — I2510 Atherosclerotic heart disease of native coronary artery without angina pectoris: Secondary | ICD-10-CM | POA: Diagnosis present

## 2021-09-10 DIAGNOSIS — K219 Gastro-esophageal reflux disease without esophagitis: Secondary | ICD-10-CM | POA: Diagnosis present

## 2021-09-10 DIAGNOSIS — E860 Dehydration: Secondary | ICD-10-CM | POA: Diagnosis present

## 2021-09-10 DIAGNOSIS — K766 Portal hypertension: Secondary | ICD-10-CM | POA: Diagnosis not present

## 2021-09-10 DIAGNOSIS — M545 Low back pain, unspecified: Secondary | ICD-10-CM | POA: Diagnosis present

## 2021-09-10 DIAGNOSIS — K922 Gastrointestinal hemorrhage, unspecified: Secondary | ICD-10-CM | POA: Diagnosis not present

## 2021-09-10 DIAGNOSIS — K7469 Other cirrhosis of liver: Secondary | ICD-10-CM | POA: Diagnosis present

## 2021-09-10 DIAGNOSIS — C384 Malignant neoplasm of pleura: Secondary | ICD-10-CM | POA: Diagnosis not present

## 2021-09-10 DIAGNOSIS — K7682 Hepatic encephalopathy: Secondary | ICD-10-CM | POA: Diagnosis not present

## 2021-09-10 DIAGNOSIS — Z7951 Long term (current) use of inhaled steroids: Secondary | ICD-10-CM | POA: Diagnosis not present

## 2021-09-10 DIAGNOSIS — N179 Acute kidney failure, unspecified: Secondary | ICD-10-CM | POA: Diagnosis not present

## 2021-09-10 DIAGNOSIS — G8929 Other chronic pain: Secondary | ICD-10-CM | POA: Diagnosis present

## 2021-09-10 LAB — BPAM RBC
Blood Product Expiration Date: 202301252359
Blood Product Expiration Date: 202302042359
Blood Product Expiration Date: 202302062359
ISSUE DATE / TIME: 202301112119
ISSUE DATE / TIME: 202301120630
ISSUE DATE / TIME: 202301120948
Unit Type and Rh: 5100
Unit Type and Rh: 5100
Unit Type and Rh: 5100

## 2021-09-10 LAB — CBC WITH DIFFERENTIAL/PLATELET
Abs Immature Granulocytes: 0.03 10*3/uL (ref 0.00–0.07)
Abs Immature Granulocytes: 0.03 10*3/uL (ref 0.00–0.07)
Basophils Absolute: 0 10*3/uL (ref 0.0–0.1)
Basophils Absolute: 0 10*3/uL (ref 0.0–0.1)
Basophils Relative: 1 %
Basophils Relative: 1 %
Eosinophils Absolute: 0.1 10*3/uL (ref 0.0–0.5)
Eosinophils Absolute: 0.2 10*3/uL (ref 0.0–0.5)
Eosinophils Relative: 4 %
Eosinophils Relative: 4 %
HCT: 25.6 % — ABNORMAL LOW (ref 39.0–52.0)
HCT: 24.1 % — ABNORMAL LOW (ref 39.0–52.0)
Hemoglobin: 7.9 g/dL — ABNORMAL LOW (ref 13.0–17.0)
Hemoglobin: 7.6 g/dL — ABNORMAL LOW (ref 13.0–17.0)
Immature Granulocytes: 1 %
Immature Granulocytes: 1 %
Lymphocytes Relative: 12 %
Lymphocytes Relative: 14 %
Lymphs Abs: 0.4 10*3/uL — ABNORMAL LOW (ref 0.7–4.0)
Lymphs Abs: 0.6 10*3/uL — ABNORMAL LOW (ref 0.7–4.0)
MCH: 25.7 pg — ABNORMAL LOW (ref 26.0–34.0)
MCH: 25.9 pg — ABNORMAL LOW (ref 26.0–34.0)
MCHC: 30.9 g/dL (ref 30.0–36.0)
MCHC: 31.5 g/dL (ref 30.0–36.0)
MCV: 83.4 fL (ref 80.0–100.0)
MCV: 82.3 fL (ref 80.0–100.0)
Monocytes Absolute: 0.4 10*3/uL (ref 0.1–1.0)
Monocytes Absolute: 0.6 10*3/uL (ref 0.1–1.0)
Monocytes Relative: 12 %
Monocytes Relative: 14 %
Neutro Abs: 2 10*3/uL (ref 1.7–7.7)
Neutro Abs: 2.9 10*3/uL (ref 1.7–7.7)
Neutrophils Relative %: 70 %
Neutrophils Relative %: 66 %
Platelets: 92 10*3/uL — ABNORMAL LOW (ref 150–400)
Platelets: UNDETERMINED 10*3/uL (ref 150–400)
RBC: 3.07 MIL/uL — ABNORMAL LOW (ref 4.22–5.81)
RBC: 2.93 MIL/uL — ABNORMAL LOW (ref 4.22–5.81)
RDW: 15.9 % — ABNORMAL HIGH (ref 11.5–15.5)
RDW: 15.9 % — ABNORMAL HIGH (ref 11.5–15.5)
WBC: 2.9 10*3/uL — ABNORMAL LOW (ref 4.0–10.5)
WBC: 4.3 10*3/uL (ref 4.0–10.5)
nRBC: 0 % (ref 0.0–0.2)
nRBC: 0 % (ref 0.0–0.2)

## 2021-09-10 LAB — COMPREHENSIVE METABOLIC PANEL
ALT: 22 U/L (ref 0–44)
AST: 35 U/L (ref 15–41)
Albumin: 2.2 g/dL — ABNORMAL LOW (ref 3.5–5.0)
Alkaline Phosphatase: 117 U/L (ref 38–126)
Anion gap: 10 (ref 5–15)
BUN: 20 mg/dL (ref 8–23)
CO2: 18 mmol/L — ABNORMAL LOW (ref 22–32)
Calcium: 8 mg/dL — ABNORMAL LOW (ref 8.9–10.3)
Chloride: 109 mmol/L (ref 98–111)
Creatinine, Ser: 1.4 mg/dL — ABNORMAL HIGH (ref 0.61–1.24)
GFR, Estimated: 52 mL/min — ABNORMAL LOW (ref >60)
Glucose, Bld: 166 mg/dL — ABNORMAL HIGH (ref 70–99)
Potassium: 3.3 mmol/L — ABNORMAL LOW (ref 3.5–5.1)
Sodium: 137 mmol/L (ref 135–145)
Total Bilirubin: 1.3 mg/dL — ABNORMAL HIGH (ref 0.3–1.2)
Total Protein: 5.2 g/dL — ABNORMAL LOW (ref 6.5–8.1)

## 2021-09-10 LAB — TYPE AND SCREEN
ABO/RH(D): O POS
Antibody Screen: NEGATIVE
Unit division: 0
Unit division: 0
Unit division: 0

## 2021-09-10 LAB — AMMONIA: Ammonia: 134 umol/L — ABNORMAL HIGH (ref 9–35)

## 2021-09-10 MED ORDER — POTASSIUM CHLORIDE CRYS ER 20 MEQ PO TBCR
40.0000 meq | EXTENDED_RELEASE_TABLET | ORAL | Status: AC
  Administered 2021-09-10 (×2): 40 meq via ORAL
  Filled 2021-09-10 (×2): qty 2

## 2021-09-10 MED ORDER — ALBUMIN HUMAN 25 % IV SOLN
12.5000 g | Freq: Four times a day (QID) | INTRAVENOUS | Status: AC
  Administered 2021-09-10 (×2): 12.5 g via INTRAVENOUS
  Filled 2021-09-10 (×2): qty 50

## 2021-09-10 NOTE — Evaluation (Signed)
Occupational Therapy Evaluation Patient Details Name: [name] MRN: [id number] DOB: 01-10-1946 Today's Date: 09/10/2021   History of Present Illness Pt is a 76 y/o male admitted secondary to weakness and possible upper GI bleed. PMH includes liver cancer, CHF, DM, HTN, and CAD.   Clinical Impression   Patient is currently requiring assistance with ADLs including moderate to total assist with Lower body ADLs, supervision to Min assist with Upper body ADLs,  as well as  Minimal assist of 2 people with functional transfers to toilet-simulated to recliner.  Current level of function may be below patient's typical baseline, but no family present to give reliable prior level of function.   During this evaluation, patient was limited by generalized weakness, impaired activity tolerance, and cognitive deficits with SBT score of 16 indicating impaired cognition as well as difficulty with 1-step commands, gauging when to begin or end an activity and perseveration on topics making a change in topic difficult for the pt, all of which has the potential to impact patient's safety and independence during functional mobility, as well as performance for ADLs.  Patient lives family, who are reportedly able to provide 24/7 supervision and assistance, however no family present to confirm this.  Patient demonstrates fair rehab potential, and should benefit from continued skilled occupational therapy services while in acute care to maximize safety, independence and quality of life at home.  Continued occupational therapy services in a SNF setting prior to return home is recommended.  ?      Recommendations for follow up therapy are one component of a multi-disciplinary discharge planning process, led by the attending physician.  Recommendations may be updated based on patient status, additional functional criteria and insurance authorization.   Follow Up Recommendations  Skilled nursing-short term rehab (<3  hours/day)    Assistance Recommended at Discharge Frequent or constant Supervision/Assistance  Patient can return home with the following A lot of help with walking and/or transfers;Two people to help with bathing/dressing/bathroom;A lot of help with bathing/dressing/bathroom;Assistance with cooking/housework    Functional Status Assessment  Patient has had a recent decline in their functional status and demonstrates the ability to make significant improvements in function in a reasonable and predictable amount of time.  Equipment Recommendations   (Will defer to post-acute recommendations.)    Recommendations for Other Services       Precautions / Restrictions Precautions Precautions: Fall Restrictions Weight Bearing Restrictions: No      Mobility Bed Mobility               General bed mobility comments: Pt sitting EOB with CNA as OT entered room.    Transfers Overall transfer level:  (See ADL)                        Balance Overall balance assessment: Needs assistance Sitting-balance support: No upper extremity supported;Feet supported Sitting balance-Leahy Scale: Fair     Standing balance support: Bilateral upper extremity supported Standing balance-Leahy Scale: Poor                             ADL either performed or assessed with clinical judgement   ADL Overall ADL's : Needs assistance/impaired Eating/Feeding: Supervision/ safety   Grooming: Sitting;Set up;Supervision/safety;Cueing for sequencing Grooming Details (indicate cue type and reason): Cues to initiate and to end task at appropriate times. Upper Body Bathing: Cueing for sequencing;Min guard;Sitting Upper Body Bathing Details (indicate cue  type and reason): Cues to initiate and to end task at appropriate times. Lower Body Bathing: Maximal assistance;Sit to/from stand;Sitting/lateral leans;Cueing for sequencing Lower Body Bathing Details (indicate cue type and reason): Cues to  initiate and to end task at appropriate times. Sitting EOB pt able to reach and bathe upper legs and lower legs down to just proximal to ankles. Cannot reach feet. Upper Body Dressing : Minimal assistance;Sitting;Cueing for sequencing   Lower Body Dressing: Maximal assistance;Sitting/lateral leans;Sit to/from stand;Total assistance;Cueing for sequencing;Cueing for compensatory techniques Lower Body Dressing Details (indicate cue type and reason): Pt attemtped to ron sock, but unable to reach toes. Unable to perform figure 4. Sock was donned over toes and pt cued to try and pull up sock after backwards chaining, however pt with impairedmotor planning and unable to grasp sock effectively to pull over heel. Ultimately required Max As for socks and would need Total Assist in standing due to need of BUE support. Toilet Transfer: Stand-pivot;Rolling walker (2 wheels);Moderate assistance;+2 for safety/equipment Toilet Transfer Details (indicate cue type and reason): To recliner. Toileting- Clothing Manipulation and Hygiene: Total assistance;Sit to/from stand;+2 for safety/equipment;+2 for physical assistance Toileting - Clothing Manipulation Details (indicate cue type and reason): Male external catheter and Total Assist for peri hgyiene with pt holding RW.     Functional mobility during ADLs: Minimal assistance;Moderate assistance;Rolling walker (2 wheels);+2 for safety/equipment;Cueing for sequencing;Cueing for safety       Vision   Vision Assessment?: No apparent visual deficits     Perception Perception Perception: Not tested   Praxis Praxis Praxis: Impaired Praxis Impairment Details: Perseveration;Motor planning    Pertinent Vitals/Pain Pain Assessment: No/denies pain     Hand Dominance Right   Extremity/Trunk Assessment Upper Extremity Assessment Upper Extremity Assessment: Overall WFL for tasks assessed   Lower Extremity Assessment Lower Extremity Assessment: Generalized weakness  (3+ to lower ankle areas)   Cervical / Trunk Assessment Cervical / Trunk Assessment: Kyphotic   Communication Communication Communication: HOH;Other (comment) (Impaired hearing vs cognitive deficit vs both)   Cognition Arousal/Alertness: Awake/alert Behavior During Therapy: Flat affect Overall Cognitive Status: No family/caregiver present to determine baseline cognitive functioning Area of Impairment: Problem solving;Awareness;Safety/judgement;Following commands;Memory;Attention                 Orientation Level: Disoriented to;Place;Situation Current Attention Level: Sustained Memory: Decreased short-term memory Following Commands: Follows one step commands inconsistently;Follows one step commands with increased time Safety/Judgement: Decreased awareness of deficits;Decreased awareness of safety Awareness: Emergent Problem Solving: Slow processing;Difficulty sequencing;Requires verbal cues;Requires tactile cues General Comments: Pt with slow processing and decreased awareness of safety and deficits. Difficulty sequencing and required multimodal cues.  Pt completed Short Blessed Test Cognitive screen with score of 16. Based on clinical research findings from the Memory and Aging Project3  , the following cut   points may also be considered:   o 0 - 4 Normal Cognition   o 5 - 9 Questionable Impairment (evaluate for early dementing disorder)   o 10 or more Impairment Consistent with Dementia (evaluate for dementing disorder)     General Comments       Exercises     Shoulder Instructions      Home Living Family/patient expects to be discharged to:: Private residence Living Arrangements: Spouse/significant other Available Help at Discharge: Family (Pt reports living with son, wife, nephew and granddaughter.) Type of Home: House Home Access: Stairs to enter Technical brewer of Steps: 1 Entrance Stairs-Rails: Left Home Layout: Two level;Able to live on main  level with  bedroom/bathroom     Bathroom Shower/Tub: Teacher, early years/pre: Standard Bathroom Accessibility: Yes How Accessible: Accessible via walker Home Equipment: Rolling Walker (2 wheels);BSC/3in1;Cane - single point;Wheelchair - manual   Additional Comments: pt lives with wife, son, dtr-in-law, and 58 yo grand-dtr; very poor historian, so all information taken from prior charting.      Prior Functioning/Environment Prior Level of Function : Patient poor historian/Family not available             Mobility Comments: Use RW for mobility ADLs Comments: reports independence, but anticipate pt required assist        OT Problem List: Decreased strength;Cardiopulmonary status limiting activity;Decreased cognition;Increased edema;Decreased activity tolerance;Decreased safety awareness;Decreased knowledge of use of DME or AE;Impaired balance (sitting and/or standing);Decreased knowledge of precautions      OT Treatment/Interventions: Self-care/ADL training;Therapeutic exercise;Therapeutic activities;Cognitive remediation/compensation;Patient/family education;DME and/or AE instruction;Balance training    OT Goals(Current goals can be found in the care plan section) Acute Rehab OT Goals Patient Stated Goal: "walk" OT Goal Formulation: With patient Time For Goal Achievement: 09/24/21 Potential to Achieve Goals: Fair ADL Goals Pt Will Perform Grooming: standing;with supervision (At least 1 task standing) Pt Will Perform Lower Body Dressing: with min guard assist;with adaptive equipment;sit to/from stand;sitting/lateral leans Pt Will Transfer to Toilet: with supervision;stand pivot transfer Pt Will Perform Toileting - Clothing Manipulation and hygiene: with min guard assist;sitting/lateral leans;sit to/from stand Additional ADL Goal #1: Pt will demonstrate improved cognition with scoring under a 9 on Short Blessed Test to allow pt to assist caregivers and follow instructions for  safety and ADLs  OT Frequency: Min 2X/week    Co-evaluation              AM-PAC OT "6 Clicks" Daily Activity     Outcome Measure Help from another person eating meals?: A Little Help from another person taking care of personal grooming?: A Little Help from another person toileting, which includes using toliet, bedpan, or urinal?: Total Help from another person bathing (including washing, rinsing, drying)?: A Lot Help from another person to put on and taking off regular upper body clothing?: A Little Help from another person to put on and taking off regular lower body clothing?: A Lot 6 Click Score: 14   End of Session Equipment Utilized During Treatment: Gait belt;Rolling walker (2 wheels) Nurse Communication: Mobility status  Activity Tolerance: Patient tolerated treatment well Patient left: in chair;with call bell/phone within reach;with chair alarm set;with nursing/sitter in room  OT Visit Diagnosis: Unsteadiness on feet (R26.81);Other symptoms and signs involving cognitive function;Muscle weakness (generalized) (M62.81)                Time: 9379-0240 OT Time Calculation (min): 30 min Charges:  OT General Charges $OT Visit: 1 Visit OT Evaluation $OT Eval Moderate Complexity: 1 Mod OT Treatments $Self Care/Home Management : 8-22 mins  Anderson Malta, OT Acute Rehab Services Office: [phone] 09/10/2021   Julien Girt 09/10/2021, 12:24 PM

## 2021-09-10 NOTE — Progress Notes (Signed)
Subjective: No complaints. No blood in stool.  Objective: Vital signs in last 24 hours: Temp:  [97.9 F (36.6 C)-98.5 F (36.9 C)] 97.9 F (36.6 C) (01/13 0617) Pulse Rate:  [63-74] 68 (01/13 0617) Resp:  [13-19] 18 (01/13 0617) BP: (109-143)/(55-84) 135/57 (01/13 0617) SpO2:  [98 %-100 %] 98 % (01/13 0617) Weight change:  Last BM Date: 09/10/21  PE: GEN:  NAD, chronically ill-appearing ABD:  Soft, protuberant  Lab Results:  CBC    Component Value Date/Time   WBC 2.9 (L) 09/10/2021 0431   RBC 3.07 (L) 09/10/2021 0431   HGB 7.9 (L) 09/10/2021 0431   HGB 8.5 (L) 08/03/2021 0742   HCT 25.6 (L) 09/10/2021 0431   HCT 23.0 (L) 05/20/2021 1444   PLT 92 (L) 09/10/2021 0431   PLT 126 (L) 08/03/2021 0742   MCV 83.4 09/10/2021 0431   MCH 25.7 (L) 09/10/2021 0431   MCHC 30.9 09/10/2021 0431   RDW 15.9 (H) 09/10/2021 0431   LYMPHSABS 0.4 (L) 09/10/2021 0431   MONOABS 0.4 09/10/2021 0431   EOSABS 0.1 09/10/2021 0431   BASOSABS 0.0 09/10/2021 0431  CMP     Component Value Date/Time   NA 137 09/10/2021 0431   NA 139 08/25/2020 1420   K 3.3 (L) 09/10/2021 0431   CL 109 09/10/2021 0431   CO2 18 (L) 09/10/2021 0431   GLUCOSE 166 (H) 09/10/2021 0431   BUN 20 09/10/2021 0431   BUN 26 08/25/2020 1420   CREATININE 1.40 (H) 09/10/2021 0431   CREATININE 1.13 05/20/2021 1444   CREATININE 0.75 06/09/2015 1609   CALCIUM 8.0 (L) 09/10/2021 0431   PROT 5.2 (L) 09/10/2021 0431   ALBUMIN 2.2 (L) 09/10/2021 0431   AST 35 09/10/2021 0431   AST 43 (H) 05/20/2021 1444   ALT 22 09/10/2021 0431   ALT 29 05/20/2021 1444   ALKPHOS 117 09/10/2021 0431   BILITOT 1.3 (H) 09/10/2021 0431   BILITOT 0.9 05/20/2021 1444   GFRNONAA 52 (L) 09/10/2021 0431   GFRNONAA >60 05/20/2021 1444   GFRAA 59 (L) 08/25/2020 1420   Assessment:   Acute on chronic anemia.  Likely acute drop since he stopped taking iron.  Cirrhosis with portal htn s/p tips.  Recent imaging shows some persistent portal  htn. Hepatocellular cancer. Alcohol abuse.  Plan:   Pantoprazole 40 mg po bid indefinitely.  No need from GI perspective for octreotide or antibiotics.  Iron supplementation by mouth; if, in future, anemia persists despite this measure, then hematology consult for IV iron; if still persists, IR evaluation for possible further interventional work-up to help persistent portal hypertension. Continue outpatient GI medications (including rifaximin and lactulose). Judicious transfusion as needed, goal ~ 7.5 hgb. Advance diet as tolerated. No further GI work-up or testing anticipated. [facility] will sign-off; please call with questions; thank you for the consultation; patient can follow-up with [place] in [facility] as outpatient.   [name] 09/10/2021, 11:12 AM   Cell [phone] If no answer or after 5 PM call [phone]

## 2021-09-10 NOTE — TOC Progression Note (Addendum)
Transition of Care [facility]) - Progression Note    Patient Details  Name: [name] MRN: [id number] Date of Birth: [birth year]  Transition of Care [facility]) CM/SW [place], [place] Phone Number: 09/10/2021, 12:26 PM  Clinical Narrative:     CSW received callback from patients son [name]. CSW spoke with patients son [name] regarding PT recommendation of SNF placement at time of discharge. Patients son reports patient comes from home with him. Patients son expressed understanding of PT recommendation for patient and declined SNF placement at time of discharge. Patients son reports plan is for patient to return home and would like home health services for patient.CSW informed case manger.No further questions reported at this time. CSW to continue to follow and assist with discharge planning needs.   CSW tried to call patients son [name] to discuss dc plan for patient. CSW awaiting callback. CSW will continue to follow and assist with patients dc planning needs.       Expected Discharge Plan and Services                                                 Social Determinants of Health (SDOH) Interventions    Readmission Risk Interventions Readmission Risk Prevention Plan 01/26/2021 08/12/2020 08/12/2020  Transportation Screening Complete - Complete  PCP or Specialist Appt within 5-7 Days - - -  Not Complete comments - - -  PCP or Specialist Appt within 3-5 Days - Complete -  Home Care Screening - - -  Medication Review (RN CM) - - -  [place] or [place] - - Complete  Social Work Consult for [place] Planning/Counseling - - Complete  Palliative Care Screening - - Not Applicable  Medication Review Press photographer) Complete - -  PCP or Specialist appointment within 3-5 days of discharge Complete - -  [place] or [place] Complete - -  SW Recovery Care/Counseling Consult Complete - -  [place] Patient Refused - -  Some recent data might be hidden

## 2021-09-10 NOTE — Progress Notes (Signed)
PROGRESS NOTE    [name]  WJX:[id number] DOB: [birth year] DOA: 09/08/2021 PCP: [name], MD   Brief Narrative:  [name] is a 76 y.o. male with medical history significant for hepatocellular carcinoma, cirrhosis complicated by portal hypertension, chronic pancytopenia, upper GI bleed, GERD, who is admitted to [facility] on 09/08/2021 with suspected acute upper gastrointestinal bleed after presenting from home to [facility] ED complaining of generalized weakness.   Assessment & Plan:   Principal Problem:   Acute upper GI bleed Active Problems:   Acute on chronic anemia   Cirrhosis (HCC)   Acute hepatic encephalopathy   Lactic acidosis   GERD (gastroesophageal reflux disease)   Chronic diastolic CHF (congestive heart failure) (HCC)   Generalized weakness  Possible acute upper GI bleed/acute blood loss anemia/generalized weakness: Weakness secondary to acute anemia.  Presented with hemoglobin of 5.2.  Status post 2 unit of PRBC, hemoglobin improved to 7.9. Per GI recommendations, he was initially started on Protonix as well as octreotide drip which was discontinued by themselves.  No history of hematemesis lately however per GI note, patient's recent EGD was done on 05/18/2021 by Dr. [name] and that showed Normal esophagus, portal hypertensive gastropathy, type I isolated gastric varices without bleeding, multiple nonbleeding angioectasias in the stomach, few small proximal duodenal AVMs.  Estimated over 15 gastric AVMs as well as mild to moderate gastropathy.  Colonoscopy showed Hemorrhoids, ulcerated mucosa in the rectum and rectosigmoid, diverticulosis in sigmoid and descending colon, single nonbleeding colonic angiectasia (clip was placed), few nonbleeding colonic angioectasias.  So basically patient has several findings on the EGD and colonoscopy which can be the cause of his acute blood loss anemia/GI bleed.  Per GI note, patient's hemoglobin drop is likely secondary  to him not taking oral iron.  However during my conversation with patient, he does not even know what medication he takes and tells me that his wife manages the medications.  Nonetheless, I find it hard for patient's hemoglobin to drop all the way from 9.0 just 3 weeks ago on 08/12/2021 to 5.2 this admission.  GI has no plans and they have signed off.  We will continue current Protonix and follow CBC tomorrow.  Pancytopenia: Platelets and white blood cells at his baseline.  History of liver cirrhosis: Continue Lasix, rifaximin and Aldactone.  Acute hepatic encephalopathy: Ammonia 137 which was only 44 about 6 weeks ago.  He is supposed to take 30 g of lactulose 3 times daily.  This was resumed.  Ammonia did not improve much, currently 134 however patient is now fully alert and oriented.  AKI: Creatinine jumped from 1.15 yesterday to 1.40 today.  He has already received his Lasix 40 mg and Aldactone 50 mg.  We will hold both of those right now and give him 2 doses of albumin.  Repeat labs in the morning.  Hypokalemia: We will replace.  DVT prophylaxis: SCDs Start: 09/08/21 2212   Code Status: Full Code  Family Communication: None present at bedside.    Status is: Inpatient  Remains inpatient appropriate because: AKI.       Estimated body mass index is 31.42 kg/m as calculated from the following:   Height as of this encounter: 5\' 7"  (1.702 m).   Weight as of this encounter: 91 kg.    Nutritional Assessment: Body mass index is 31.42 kg/m.Marland Kitchen Seen by dietician.  I agree with the assessment and plan as outlined below: Nutrition Status:        .  Skin Assessment: I have examined the patient's skin and I agree with the wound assessment as performed by the wound care RN as outlined below:    Consultants:  GI  Procedures:  None  Antimicrobials:  Anti-infectives (From admission, onward)    Start     Dose/Rate Route Frequency Ordered Stop   09/09/21 1000  rifaximin (XIFAXAN)  tablet 550 mg        550 mg Oral 2 times daily 09/09/21 0801     09/08/21 2230  cefTRIAXone (ROCEPHIN) 2 g in sodium chloride 0.9 % 100 mL IVPB  Status:  Discontinued        2 g 200 mL/hr over 30 Minutes Intravenous Every 24 hours 09/08/21 2215 09/09/21 1156         Subjective: Seen and examined.  Eating his breakfast.  Fully alert and oriented.  No complaints.  Objective: Vitals:   09/09/21 1845 09/09/21 2300 09/10/21 0617 09/10/21 1301  BP: 116/65 (!) 121/57 (!) 135/57 (!) 119/51  Pulse: 70 63 68 65  Resp:  18 18 18   Temp:  98 F (36.7 C) 97.9 F (36.6 C) 97.9 F (36.6 C)  TempSrc:  Oral Oral Oral  SpO2: 100% 99% 98% 100%  Weight:      Height:        Intake/Output Summary (Last 24 hours) at 09/10/2021 1506 Last data filed at 09/10/2021 0600 Gross per 24 hour  Intake 240 ml  Output 2100 ml  Net -1860 ml    Filed Weights   09/08/21 1713  Weight: 91 kg    Examination:  General exam: Appears calm and comfortable  Respiratory system: Clear to auscultation. Respiratory effort normal. Cardiovascular system: S1 & S2 heard, RRR. No JVD, murmurs, rubs, gallops or clicks. No pedal edema. Gastrointestinal system: Abdomen is nondistended, soft and nontender. No organomegaly or masses felt. Normal bowel sounds heard. Central nervous system: Alert and oriented. No focal neurological deficits. Extremities: Symmetric 5 x 5 power. Skin: No rashes, lesions or ulcers.  Psychiatry: Judgement and insight appear poor  Data Reviewed: I have personally reviewed following labs and imaging studies  CBC: Recent Labs  Lab 09/08/21 1720 09/09/21 0309 09/09/21 1427 09/09/21 1647 09/10/21 0431  WBC 2.7* 2.7* 2.7* 5.1 2.9*  NEUTROABS 2.1 1.9 1.9 3.0 2.0  HGB 5.2* 5.8*   5.9* 7.3* 8.6* 7.9*  HCT 17.2* 19.9*   19.3* 23.1* 26.9* 25.6*  MCV 85.6 85.8 82.8 85.1 83.4  PLT 98* 85* 91* 94* 92*    Basic Metabolic Panel: Recent Labs  Lab 09/08/21 1720 09/09/21 0309 09/10/21 0431  NA  135 133* 137  K 3.8 3.8 3.3*  CL 106 109 109  CO2 17* 19* 18*  GLUCOSE 129* 111* 166*  BUN 21 20 20   CREATININE 1.17 1.15 1.40*  CALCIUM 8.1* 7.8* 8.0*  MG  --  2.1   2.1  --     GFR: Estimated Creatinine Clearance: 49.1 mL/min (A) (by C-G formula based on SCr of 1.4 mg/dL (H)). Liver Function Tests: Recent Labs  Lab 09/08/21 1720 09/09/21 0309 09/10/21 0431  AST 42* 34 35  ALT 22 21 22   ALKPHOS 115 120 117  BILITOT 1.2 1.4* 1.3*  PROT 5.3* 5.1* 5.2*  ALBUMIN 2.3* 2.1* 2.2*    Recent Labs  Lab 09/08/21 1720  LIPASE 32    Recent Labs  Lab 09/08/21 1720 09/10/21 0602  AMMONIA 137* 134*    Coagulation Profile: Recent Labs  Lab 09/09/21 0309  INR  1.3*    Cardiac Enzymes: No results for input(s): CKTOTAL, CKMB, CKMBINDEX, TROPONINI in the last 168 hours. BNP (last 3 results) No results for input(s): PROBNP in the last 8760 hours. HbA1C: No results for input(s): HGBA1C in the last 72 hours. CBG: No results for input(s): GLUCAP in the last 168 hours. Lipid Profile: No results for input(s): CHOL, HDL, LDLCALC, TRIG, CHOLHDL, LDLDIRECT in the last 72 hours. Thyroid Function Tests: Recent Labs    09/09/21 0309  TSH 1.210    Anemia Panel: Recent Labs    09/09/21 0309  FOLATE 11.2  FERRITIN 15*  TIBC 328  IRON 39*  RETICCTPCT 2.4    Sepsis Labs: Recent Labs  Lab 09/08/21 1720 09/08/21 1907  LATICACIDVEN 2.7* 2.0*     Recent Results (from the past 240 hour(s))  Resp Panel by RT-PCR (Flu A&B, Covid) Nasopharyngeal Swab     Status: None   Collection Time: 09/08/21  5:08 PM   Specimen: Nasopharyngeal Swab; Nasopharyngeal(NP) swabs in vial transport medium  Result Value Ref Range Status   SARS Coronavirus 2 by RT PCR NEGATIVE NEGATIVE Final    Comment: (NOTE) SARS-CoV-2 target nucleic acids are NOT DETECTED.  The SARS-CoV-2 RNA is generally detectable in upper respiratory specimens during the acute phase of infection. The  lowest concentration of SARS-CoV-2 viral copies this assay can detect is 138 copies/mL. A negative result does not preclude SARS-Cov-2 infection and should not be used as the sole basis for treatment or other patient management decisions. A negative result may occur with  improper specimen collection/handling, submission of specimen other than nasopharyngeal swab, presence of viral mutation(s) within the areas targeted by this assay, and inadequate number of viral copies(<138 copies/mL). A negative result must be combined with clinical observations, patient history, and epidemiological information. The expected result is Negative.  Fact Sheet for Patients:  EntrepreneurPulse.com.au  Fact Sheet for Healthcare Providers:  IncredibleEmployment.be  This test is no t yet approved or cleared by the [place] FDA and  has been authorized for detection and/or diagnosis of SARS-CoV-2 by FDA under an Emergency Use Authorization (EUA). This EUA will remain  in effect (meaning this test can be used) for the duration of the COVID-19 declaration under Section 564(b)(1) of the Act, 21 U.S.C.section 360bbb-3(b)(1), unless the authorization is terminated  or revoked sooner.       Influenza A by PCR NEGATIVE NEGATIVE Final   Influenza B by PCR NEGATIVE NEGATIVE Final    Comment: (NOTE) The Xpert Xpress SARS-CoV-2/FLU/RSV plus assay is intended as an aid in the diagnosis of influenza from Nasopharyngeal swab specimens and should not be used as a sole basis for treatment. Nasal washings and aspirates are unacceptable for Xpert Xpress SARS-CoV-2/FLU/RSV testing.  Fact Sheet for Patients: EntrepreneurPulse.com.au  Fact Sheet for Healthcare Providers: IncredibleEmployment.be  This test is not yet approved or cleared by the [place] FDA and has been authorized for detection and/or diagnosis of SARS-CoV-2 by FDA under  an Emergency Use Authorization (EUA). This EUA will remain in effect (meaning this test can be used) for the duration of the COVID-19 declaration under Section 564(b)(1) of the Act, 21 U.S.C. section 360bbb-3(b)(1), unless the authorization is terminated or revoked.  Performed at [facility], [place] [address], [place], [place] [address]       Radiology Studies: CT CHEST WO CONTRAST  Result Date: 09/10/2021 CLINICAL DATA:  Abnormal chest x-ray. Lung opacities. History of CHF. EXAM: CT CHEST WITHOUT CONTRAST TECHNIQUE: Multidetector  CT imaging of the chest was performed following the standard protocol without IV contrast. RADIATION DOSE REDUCTION: This exam was performed according to the departmental dose-optimization program which includes automated exposure control, adjustment of the mA and/or kV according to patient size and/or use of iterative reconstruction technique. COMPARISON:  CT abdomen and pelvis from yesterday. CTA chest dated December 27, 2019. CT chest dated October 01, 2017. FINDINGS: Cardiovascular: Unchanged mild cardiomegaly. No pericardial effusion. No thoracic aortic aneurysm. Coronary, aortic arch, and branch vessel atherosclerotic vascular disease. Mediastinum/Nodes: New mildly enlarged right paratracheal lymph nodes measuring up to 1.4 cm in short axis (series 3, image 49). New subcarinal lymphadenopathy measuring up to 2.1 cm in short axis (series 3, image 78). Unchanged calcified mediastinal and bilateral hilar lymph nodes. No enlarged axillary lymph nodes. The thyroid gland, trachea, and esophagus demonstrate no significant findings. Lungs/Pleura: Small to moderate right and trace left pleural effusions. Round consolidation in the posterior right lower lobe is unchanged compared to yesterday's CT, but new since April 2021. Diffuse central peribronchial thickening and smooth interlobular septal thickening throughout both lungs. 7 mm nodule in the anterior right upper lobe  (series 4, image 66), unchanged since April 2021, benign. No pneumothorax. Upper Abdomen: No acute abnormality.  No change from yesterday's CT. Musculoskeletal: No acute or significant osseous findings. IMPRESSION: 1. Round mass-like consolidation in the posterior right lower lobe is unchanged compared to yesterday's CT, but new since June 2022. In light of the new mediastinal lymphadenopathy and patient's smoking history, findings are concerning for primary bronchogenic neoplasm. Consultation with pulmonary medicine or thoracic surgery is suggested, as clinically appropriate. 2. Small to moderate right and trace left pleural effusions with mild to moderate interstitial pulmonary edema. 3. Aortic Atherosclerosis (ICD10-I70.0). Electronically Signed   By: Titus Dubin M.D.   On: 09/10/2021 09:14   CT ABDOMEN PELVIS W CONTRAST  Result Date: 09/09/2021 CLINICAL DATA:  Cirrhosis. Hepatocellular carcinoma. Previous TIPS. Upper GI bleeding. Weakness and confusion. EXAM: CT ABDOMEN AND PELVIS WITH CONTRAST TECHNIQUE: Multidetector CT imaging of the abdomen and pelvis was performed using the standard protocol following bolus administration of intravenous contrast. CONTRAST:  125mL OMNIPAQUE IOHEXOL 300 MG/ML  SOLN COMPARISON:  Noncontrast CT on 01/29/2021 FINDINGS: Lower Chest: New moderate right pleural effusion and tiny left pleural effusion are seen. New rounded area of consolidation in the posterior right lower lobe measures 3 x 4 cm, and may be inflammatory, infectious, or neoplastic in etiology. Hepatobiliary: Hepatic cirrhosis again demonstrated. A subcapsular cyst measuring 3 cm in the lateral liver dome remains stable. No suspicious liver lesions are identified. TIPS is again seen, with patent portal veins and IVC. Hepatic veins are also patent. Gallbladder is unremarkable. No evidence of biliary ductal dilatation. Pancreas:  No mass or inflammatory changes. Spleen: Within normal limits in size. A few tiny  cysts and calcified granulomata are stable. No masses identified. Adrenals/Urinary Tract: No masses identified. Diffuse right renal atrophy is seen as well as a 13 mm right renal calculus. No evidence of ureteral calculi or hydronephrosis. Urinary bladder is distended but otherwise unremarkable in appearance. Stomach/Bowel: No evidence of obstruction, inflammatory process or abnormal fluid collections. Large stool burden seen throughout the colon. Vascular/Lymphatic: No pathologically enlarged lymph nodes. No acute vascular findings. Aortic atherosclerotic calcification noted. Recanalization of paraumbilical veins and esophageal varices again seen, consistent with portal venous hypertension. Reproductive:  No mass or other significant abnormality. Other:  Mild diffuse body wall edema, but no evidence ascites. Musculoskeletal:  No  suspicious bone lesions identified. : New moderate right pleural effusion and tiny left pleural effusion. New masslike consolidation in posterior right lower lobe, which may be inflammatory, infectious, or neoplastic in etiology. Consider chest CT for further evaluation. No acute findings in the abdomen or pelvis. Hepatic cirrhosis and findings of portal venous hypertension. No evidence of recurrent hepatocellular carcinoma or metastatic disease. Large stool burden noted; recommend clinical correlation for possible constipation. Right renal atrophy and nephrolithiasis. No evidence of ureteral calculi or hydronephrosis. Aortic Atherosclerosis (ICD10-I70.0). Electronically Signed   By: Marlaine Hind M.D.   On: 09/09/2021 15:13   DG Chest Port 1 View  Result Date: 09/08/2021 CLINICAL DATA:  Weakness, confused, disoriented, history of CHF EXAM: PORTABLE CHEST 1 VIEW COMPARISON:  Chest radiograph 05/28/2021 FINDINGS: Median sternotomy wires and mediastinal surgical clips are again noted. The heart is enlarged, unchanged. The mediastinal contours are grossly stable. There are diffusely  increased interstitial markings throughout both lungs along with central vascular congestion. There is no focal consolidation. There is no significant pleural effusion. There is no pneumothorax There is no acute osseous abnormality. IMPRESSION: Cardiomegaly with probable mild to moderate pulmonary interstitial edema. No focal consolidation or significant pleural effusion. Electronically Signed   By: Valetta Mole M.D.   On: 09/08/2021 18:10    Scheduled Meds:  ferrous sulfate  325 mg Oral BID WC   furosemide  40 mg Oral Daily   lactulose  30 g Oral TID   lactulose  300 mL Rectal Once   mouth rinse  15 mL Mouth Rinse BID   pantoprazole  40 mg Oral BID AC   rifaximin  550 mg Oral BID   spironolactone  50 mg Oral Daily   umeclidinium-vilanterol  1 puff Inhalation Daily   Continuous Infusions:  albumin human       LOS: 0 days   Time spent: 30 minutes  Darliss Cheney, MD [facility]  09/10/2021, 3:06 PM  Please page via [place] and do not message via secure chat for anything urgent. Secure chat can be used for anything non urgent.  How to contact the [facility] Attending or Consulting provider [place] or covering provider during after hours [place], for this patient?  Check the care team in [facility] and look for a) attending/consulting TRH provider listed and b) the [facility] team listed. Page or secure chat [phone]. Log into [email or web address] and use [place]'s universal password to access. If you do not have the password, please contact the hospital operator. Locate the [facility] provider you are looking for under [facility] and page to a number that you can be directly reached. If you still have difficulty reaching the provider, please page the [facility] (Director on Call) for the Hospitalists listed on amion for assistance.

## 2021-09-10 NOTE — Care Management Obs Status (Signed)
[place] NOTIFICATION   Patient Details  Name: [name] MRN: [id number] Date of Birth: 1945/08/30   Medicare Observation Status Notification Given:  Yes    Bethena Roys, RN 09/10/2021, 9:45 AM

## 2021-09-11 ENCOUNTER — Inpatient Hospital Stay (HOSPITAL_COMMUNITY): Payer: Medicare HMO

## 2021-09-11 ENCOUNTER — Telehealth: Payer: Self-pay | Admitting: Pulmonary Disease

## 2021-09-11 DIAGNOSIS — K7682 Hepatic encephalopathy: Principal | ICD-10-CM

## 2021-09-11 DIAGNOSIS — J90 Pleural effusion, not elsewhere classified: Secondary | ICD-10-CM | POA: Diagnosis not present

## 2021-09-11 DIAGNOSIS — K922 Gastrointestinal hemorrhage, unspecified: Secondary | ICD-10-CM | POA: Diagnosis not present

## 2021-09-11 DIAGNOSIS — R918 Other nonspecific abnormal finding of lung field: Secondary | ICD-10-CM

## 2021-09-11 DIAGNOSIS — K7460 Unspecified cirrhosis of liver: Secondary | ICD-10-CM | POA: Diagnosis not present

## 2021-09-11 LAB — COMPREHENSIVE METABOLIC PANEL
ALT: 21 U/L (ref 0–44)
AST: 31 U/L (ref 15–41)
Albumin: 2.5 g/dL — ABNORMAL LOW (ref 3.5–5.0)
Alkaline Phosphatase: 115 U/L (ref 38–126)
Anion gap: 10 (ref 5–15)
BUN: 15 mg/dL (ref 8–23)
CO2: 20 mmol/L — ABNORMAL LOW (ref 22–32)
Calcium: 8.2 mg/dL — ABNORMAL LOW (ref 8.9–10.3)
Chloride: 106 mmol/L (ref 98–111)
Creatinine, Ser: 1.18 mg/dL (ref 0.61–1.24)
GFR, Estimated: >60 mL/min (ref >60)
Glucose, Bld: 97 mg/dL (ref 70–99)
Potassium: 3.6 mmol/L (ref 3.5–5.1)
Sodium: 136 mmol/L (ref 135–145)
Total Bilirubin: 1.3 mg/dL — ABNORMAL HIGH (ref 0.3–1.2)
Total Protein: 5.5 g/dL — ABNORMAL LOW (ref 6.5–8.1)

## 2021-09-11 LAB — CBC WITH DIFFERENTIAL/PLATELET
Abs Immature Granulocytes: 0.01 10*3/uL (ref 0.00–0.07)
Basophils Absolute: 0 10*3/uL (ref 0.0–0.1)
Basophils Relative: 1 %
Eosinophils Absolute: 0.2 10*3/uL (ref 0.0–0.5)
Eosinophils Relative: 6 %
HCT: 28.5 % — ABNORMAL LOW (ref 39.0–52.0)
Hemoglobin: 8.9 g/dL — ABNORMAL LOW (ref 13.0–17.0)
Immature Granulocytes: 0 %
Lymphocytes Relative: 17 %
Lymphs Abs: 0.6 10*3/uL — ABNORMAL LOW (ref 0.7–4.0)
MCH: 26.6 pg (ref 26.0–34.0)
MCHC: 31.2 g/dL (ref 30.0–36.0)
MCV: 85.1 fL (ref 80.0–100.0)
Monocytes Absolute: 0.4 10*3/uL (ref 0.1–1.0)
Monocytes Relative: 11 %
Neutro Abs: 2.3 10*3/uL (ref 1.7–7.7)
Neutrophils Relative %: 65 %
Platelets: UNDETERMINED 10*3/uL (ref 150–400)
RBC: 3.35 MIL/uL — ABNORMAL LOW (ref 4.22–5.81)
RDW: 16.1 % — ABNORMAL HIGH (ref 11.5–15.5)
WBC: 3.6 10*3/uL — ABNORMAL LOW (ref 4.0–10.5)
nRBC: 0.6 % — ABNORMAL HIGH (ref 0.0–0.2)

## 2021-09-11 LAB — BODY FLUID CELL COUNT WITH DIFFERENTIAL
Eos, Fluid: 0 %
Lymphs, Fluid: 48 %
Monocyte-Macrophage-Serous Fluid: 16 % — ABNORMAL LOW (ref 50–90)
Neutrophil Count, Fluid: 35 % — ABNORMAL HIGH (ref 0–25)
Total Nucleated Cell Count, Fluid: 36 cu mm (ref 0–1000)

## 2021-09-11 LAB — AMMONIA: Ammonia: 82 umol/L — ABNORMAL HIGH (ref 9–35)

## 2021-09-11 LAB — PROTEIN, PLEURAL OR PERITONEAL FLUID: Total protein, fluid: <3 g/dL

## 2021-09-11 LAB — GLUCOSE, PLEURAL OR PERITONEAL FLUID: Glucose, Fluid: 144 mg/dL

## 2021-09-11 LAB — LACTATE DEHYDROGENASE, PLEURAL OR PERITONEAL FLUID: LD, Fluid: 310 U/L — ABNORMAL HIGH (ref 3–23)

## 2021-09-11 MED ORDER — FUROSEMIDE 40 MG PO TABS
40.0000 mg | ORAL_TABLET | Freq: Every day | ORAL | Status: DC
  Administered 2021-09-12: 40 mg via ORAL
  Filled 2021-09-11 (×2): qty 1

## 2021-09-11 MED ORDER — SPIRONOLACTONE 25 MG PO TABS
50.0000 mg | ORAL_TABLET | Freq: Every day | ORAL | Status: DC
  Administered 2021-09-12: 50 mg via ORAL
  Filled 2021-09-11 (×3): qty 2

## 2021-09-11 NOTE — TOC Initial Note (Signed)
Transition of Care [facility]) - Initial/Assessment Note    Patient Details  Name: [name] MRN: [id number] Date of Birth: 1946/03/29  Transition [facility]) CM/SW Contact:    Bethena Roys, RN Phone Number: 09/11/2021, 4:10 PM  Clinical Narrative: Risk for readmission assessment completed. Prior to arrival patient was from home with son and the sons daughter. Patient has rolling walker, cane and bedside commode in the home. No durable medical equipment (DME) needs identified at this time. Recommendations were for SNF and patient declined. Son is stating the patient has 24/7 supervision in the home. Patient was previously active with [place]- referral called to [facility] and awaiting phone call for confirmation. Patient will need orders for [facility] PT/OT/Aide, RN and F2F once stable.  Case Manager awaiting to hear back from [facility]. Case Manager will continue to follow for additional TOC needs.       Expected Discharge Plan: [place] Barriers to Discharge: Continued Medical Work up   Patient Goals and CMS Choice Patient states their goals for this hospitalization and ongoing recovery are:: patient wants to return home CMS Medicare.gov Compare Post Acute Care list provided to:: Patient Represenative (must comment) (patients son [name]) Choice offered to / list presented to : Patient ([facility] was supposed to be visiting.)  Expected Discharge Plan and Services Expected Discharge Plan: [place] In-house Referral: Clinical Social Work Discharge Planning Services: CM Consult Post Acute Care Choice: [place] arrangements for the past 2 months: Single Family Home                 DME Arranged: N/A DME Agency: NA       HH Arranged: RN, Disease Management, PT, OT, Nurse's Aide HH Agency: [place] Date HH Agency Contacted: 09/11/21 Time HH Agency Contacted: 58 Representative spoke with at [place]:  Alwyn Ren  Prior Living Arrangements/Services Living arrangements for the past 2 months: [place] with:: Self, Adult Children Patient language and need for interpreter reviewed:: Yes Do you feel safe going back to the place where you live?: Yes      Need for Family Participation in Patient Care: Yes (Comment) Care giver support system in place?: Yes (comment) Current home services: DME (Patient has a RW, Cane and [facility]) Criminal Activity/Legal Involvement Pertinent to Current Situation/Hospitalization: No - Comment as needed  Activities of Daily Living      Permission Sought/Granted Permission sought to share information with : Case Manager, Customer service manager, Family Supports Permission granted to share information with : Yes, Verbal Permission Granted  Share Information with NAME: spoke with patients son due to orientation  Permission granted to share info w AGENCY: [place] granted to share info w Relationship: spoke with patients son due to orientation     Emotional Assessment Appearance:: Appears stated age Attitude/Demeanor/Rapport: Engaged Affect (typically observed): Appropriate Orientation: : Oriented to Self Alcohol / Substance Use: Not Applicable Psych Involvement: No (comment)  Admission diagnosis:  Hepatic encephalopathy [K76.82] Thrombocytopenia ([place]) [D69.6] Acute upper GI bleed [K92.2] Acute on chronic anemia [D64.9] Acute hepatic encephalopathy [K76.82] Patient Active Problem List   Diagnosis Date Noted   Lactic acidosis 09/08/2021   GERD (gastroesophageal reflux disease) 09/08/2021   Chronic diastolic CHF (congestive heart failure) ([place])    Generalized weakness    Hypokalemia 07/23/2021   Acute hepatic encephalopathy 06/15/2021   Acute upper GI bleed 04/29/2021   Cirrhosis (  [place]) 04/29/2021   Acute on chronic anemia 04/28/2021   GI bleeding 09/10/2020   Hepatocellular carcinoma ([place]) 08/09/2020    Routine general medical examination at a health care facility 05/06/2020   Iron deficiency anemia due to chronic blood loss    Pancytopenia ([place]) 04/15/2020   Pulmonary hypertension, unspecified ([place]) 03/11/2020   Asthma, mild intermittent 66/29/4765   Alcoholic cirrhosis of liver with ascites ([place]) 12/27/2019   Acute on chronic diastolic CHF (congestive heart failure) ([place]) 11/01/2019   (HFpEF) heart failure with preserved ejection fraction ([place]) 12/24/2018   Acute on chronic anemia 12/18/2017   Peptic ulcer disease 12/18/2017   AKI (acute kidney injury) ([place]) 10/24/2017   GI bleed 09/29/2017   SOB (shortness of breath) 09/21/2017   Obesity (BMI 30.0-34.9) 02/05/2016   S/P CABG x 3 03/31/2014   Hepatic cirrhosis ([place]) 02/27/2014   Melena 08/14/2012   Hyperlipidemia with target LDL less than 70 06/29/2012   Thrombocytopenia ([place]) 06/29/2012   Coronary artery disease involving native coronary artery of native heart without angina pectoris    OSA (obstructive sleep apnea)    Type 2 diabetes with complication ([place]) 46/50/3546   GERD without esophagitis 08/04/2010   History of gout    Hypertensive heart disease    PCP:  [name], MD Pharmacy:   [facility] [place], [place] [address] AT [facility] [address] [place] [address] Phone: [phone] Fax: [phone]   Readmission Risk Interventions Readmission Risk Prevention Plan 09/11/2021 01/26/2021 08/12/2020  Transportation Screening Complete Complete -  PCP or Specialist Appt within 5-7 Days - - -  Not Complete comments - - -  PCP or Specialist Appt within 3-5 Days - - Complete  Home Care Screening - - -  Medication Review (RN CM) - - -  [place] or [place] Work Consult for [place] Planning/Counseling - - -  [place] - - -  Medication Review Press photographer) Complete Complete -  PCP or Specialist appointment  within 3-5 days of discharge Complete Complete -  [place] or Home Care Consult Complete Complete -  SW Recovery Care/Counseling Consult Complete Complete -  Palliative Care Screening Not Applicable Not Applicable -  [place] Patient Refused Patient Refused -  Some recent data might be hidden

## 2021-09-11 NOTE — Procedures (Signed)
Thoracentesis  Procedure Note  [name]  [id number]  August 01, 1946  Date:09/11/21  Time:1:31 PM   Provider Performing:Ashwin Tibbs V. Carmina Walle   Procedure: Thoracentesis with imaging guidance (23953)  Indication(s) Pleural Effusion  Consent Risks of the procedure as well as the alternatives and risks of each were explained to the patient and/or caregiver.  Consent for the procedure was obtained and is signed in the bedside chart  Anesthesia Topical only with 1% lidocaine    Time Out Verified patient identification, verified procedure, site/side was marked, verified correct patient position, special equipment/implants available, medications/allergies/relevant history reviewed, required imaging and test results available.   Sterile Technique Maximal sterile technique including full sterile barrier drape, hand hygiene, sterile gown, sterile gloves, mask, hair covering, sterile ultrasound probe cover (if used).  Procedure Description Ultrasound was used to identify appropriate pleural anatomy for placement and overlying skin marked.  Area of drainage cleaned and draped in sterile fashion. Lidocaine was used to anesthetize the skin and subcutaneous tissue.  1300 cc's of stray yellow appearing fluid was drained from the right pleural space. Catheter then removed and bandaid applied to site.     Media Information    Complications/Tolerance None; patient tolerated the procedure well. Chest X-ray is ordered to confirm no post-procedural complication.   EBL Minimal   Specimen(s) Pleural fluid   John Thoreson V. Elsworth Soho MD

## 2021-09-11 NOTE — Consult Note (Addendum)
Name: [name] MRN: [id number] DOB: 17-Apr-1946    ADMISSION DATE:  09/08/2021 CONSULTATION DATE:  09/11/2021   REFERRING MD :  Starla Link  CHIEF COMPLAINT: Generalized weakness   HISTORY OF PRESENT ILLNESS: 76 year old admitted 1/11 with generalized weakness and found to have hemoglobin of 5.2.  He is known to have EtOH cirrhosis of the liver with ascites status post TI PS procedure and embolization of gastric varices.  Last EGD 05/18/2021 by Dr. [name] has shown portal gastropathy, gastric varices without bleeding, multiple gastric AVMs. He was transfused 2 units PRBC, managed conservatively.  He has a history of hepatic encephalopathy with multiple prior admissions and is maintained on rifaximin and lactulose.  He also has a history of hepatocellular carcinoma status post coil embolization procedure. CT abdomen was performed which showed right pleural effusion and a rounded area of consolidation in the posterior right lower lobe. CT chest without contrast 1/13 showed right lower lobe masslike consolidation with new lymphadenopathy, paratracheal and subcarinal is also old calcified mediastinal lymphadenopathy.  PCCM consulted for guidance for lung mass He reports that he quit smoking 30 years ago when his son was born.  Smoked about 20 pack years.  He quit alcohol 4 years ago  PAST MEDICAL HISTORY :  Past Medical History:  Diagnosis Date   Arthritis    "joints tighten up sometimes" (03/27/2104)   Carcinoma of liver, hepatocellular ([place]) 06/30/2020   Chronic lower back pain    Coronary artery disease involving native coronary artery of native heart without angina pectoris    Severe left main disease at catheterization July 2015  CABG x3 with a LIMA to the LAD, SVG to the OM, SVG to the PDA on 03/31/14. EF 60% by cath.    Diastolic heart failure (HCC)    GERD without esophagitis 08/04/2010   Hepatic cirrhosis ([place])    a. Dx 01/2014 - CT a/p    History of blood transfusion    "related to  bleeding ulcers"   History of concussion    1976--  NO RESIDUAL   History of GI bleed    a. UGIB 07/2012;  b. 01/2014 admission with GIB/FOB stool req 1U prbc's->EGD showed portal gastropathy, barrett's esoph, and chronic active h. pylori gastritis.   History of gout    2007 &  2008  LEFT LEG-- NO ISSUE SINCE   Hyperlipidemia    Iron deficiency anemia    Kidney stones    OA (osteoarthritis of spine)    LOWER BACK--  INTERMITTANT LEFT LEG NUMBNESS   OSA (obstructive sleep apnea)    PULMOLOGIST-  DR CLANCE--  MODERATE OSA  STARTED CPAP 2012--  BUT CURRENTLY HAS NOT USED PAST 6 MONTHS   Phimosis    a. s/p circumcision 2015.   Type 2 diabetes mellitus ([place])    Unspecified essential hypertension    Past Surgical History:  Procedure Laterality Date   ANKLE FRACTURE SURGERY Right 1989   "plate put in"   APPENDECTOMY  05-16-2004   open   BIOPSY  01/08/2021   Procedure: BIOPSY;  Surgeon: Wilford Corner, MD;  Location: [facility];  Service: Endoscopy;;   BIOPSY  05/02/2021   Procedure: BIOPSY;  Surgeon: Arta Silence, MD;  Location: [facility];  Service: Endoscopy;;   CIRCUMCISION N/A 09/09/2013   Procedure: CIRCUMCISION ADULT;  Surgeon: Bernestine Amass, MD;  Location: [facility];  Service: Urology;  Laterality: N/A;   COLECTOMY  05-16-2004   COLONOSCOPY N/A 05/02/2021  Procedure: COLONOSCOPY;  Surgeon: Arta Silence, MD;  [facility]: [facility];  Service: Endoscopy;  Laterality: N/A;   COLONOSCOPY WITH PROPOFOL N/A 11/20/2020   Procedure: COLONOSCOPY WITH PROPOFOL;  Surgeon: Ronald Lobo, MD;  [facility]: [facility];  Service: Endoscopy;  Laterality: N/A;   CORONARY ANGIOPLASTY WITH STENT PLACEMENT  06/28/2012  DR COOPER   PCI W/  X1 DES to [place]. LAD/  LM  40% OSTIAL & 50-60% DISTAL /  50% PROX LCX/  30-40% PROX RCA & 50% MID RCA/   LVEF 65-70%   CORONARY ARTERY BYPASS GRAFT N/A 03/31/2014   Procedure: CORONARY ARTERY BYPASS GRAFTING (CABG) times 3 using left  internal mammary artery and right saphenous vein.;  Surgeon: Melrose Nakayama, MD;  Location: [place];  Service: Open Heart Surgery;  Laterality: N/A;   DOBUTAMINE STRESS ECHO  06-08-2012   MODERATE HYPOKINESIS/ ISCHEMIA MID INFERIOR WALL   ESOPHAGOGASTRODUODENOSCOPY  08/15/2012   Procedure: ESOPHAGOGASTRODUODENOSCOPY (EGD);  Surgeon: Wonda Horner, MD;  Location: [facility] ENDOSCOPY;  Service: Endoscopy;  Laterality: N/A;   ESOPHAGOGASTRODUODENOSCOPY N/A 02/17/2014   Procedure: ESOPHAGOGASTRODUODENOSCOPY (EGD);  Surgeon: Jeryl Columbia, MD;  Location: [facility] ENDOSCOPY;  Service: Endoscopy;  Laterality: N/A;   ESOPHAGOGASTRODUODENOSCOPY N/A 04/17/2020   Procedure: ESOPHAGOGASTRODUODENOSCOPY (EGD);  Surgeon: Ronnette Juniper, MD;  Location: [place];  Service: Gastroenterology;  Laterality: N/A;   ESOPHAGOGASTRODUODENOSCOPY N/A 09/11/2020   Procedure: ESOPHAGOGASTRODUODENOSCOPY (EGD);  Surgeon: Clarene Essex, MD;  Location: [facility];  Service: Endoscopy;  Laterality: N/A;   ESOPHAGOGASTRODUODENOSCOPY  10/09/2020   ESOPHAGOGASTRODUODENOSCOPY N/A 10/09/2020   Procedure: ESOPHAGOGASTRODUODENOSCOPY (EGD);  Surgeon: Otis Brace, MD;  Location[facility];  Service: Gastroenterology;  Laterality: N/A;   ESOPHAGOGASTRODUODENOSCOPY N/A 01/08/2021   Procedure: ESOPHAGOGASTRODUODENOSCOPY (EGD);  Surgeon: Wilford Corner, MD;  Location: [facility];  Service: Endoscopy;  Laterality: N/A;   ESOPHAGOGASTRODUODENOSCOPY N/A 05/02/2021   Procedure: ESOPHAGOGASTRODUODENOSCOPY (EGD);  Surgeon: Arta Silence, MD;  Location: [facility];  Service: Endoscopy;  Laterality: N/A;   ESOPHAGOGASTRODUODENOSCOPY (EGD) WITH PROPOFOL N/A 09/30/2017   Procedure: ESOPHAGOGASTRODUODENOSCOPY (EGD) WITH PROPOFOL;  Surgeon: Wonda Horner, MD;  Location: [facility] ENDOSCOPY;  Service: Endoscopy;  Laterality: N/A;   ESOPHAGOGASTRODUODENOSCOPY (EGD) WITH PROPOFOL N/A 12/20/2017   Procedure: ESOPHAGOGASTRODUODENOSCOPY (EGD) WITH PROPOFOL;   Surgeon: Clarene Essex, MD;  Location: [place];  Service: Endoscopy;  Laterality: N/A;   ESOPHAGOGASTRODUODENOSCOPY (EGD) WITH PROPOFOL N/A 08/10/2020   Procedure: ESOPHAGOGASTRODUODENOSCOPY (EGD) WITH PROPOFOL;  Surgeon: Wilford Corner, MD;  Location: [place];  Service: Endoscopy;  Laterality: N/A;   ESOPHAGOGASTRODUODENOSCOPY (EGD) WITH PROPOFOL N/A 11/20/2020   Procedure: ESOPHAGOGASTRODUODENOSCOPY (EGD) WITH PROPOFOL;  Surgeon: Ronald Lobo, MD;  [facility];  Service: Endoscopy;  Laterality: N/A;   ESOPHAGOGASTRODUODENOSCOPY (EGD) WITH PROPOFOL N/A 02/28/2021   Procedure: ESOPHAGOGASTRODUODENOSCOPY (EGD) WITH PROPOFOL;  Surgeon: Clarene Essex, MD;  [facility];  Service: Endoscopy;  Laterality: N/A;   GIVENS CAPSULE STUDY N/A 11/20/2020   Procedure: GIVENS CAPSULE STUDY;  Surgeon: Ronald Lobo, MD;  [facility];  Service: Endoscopy;  Laterality: N/A;   HEMOSTASIS CLIP PLACEMENT  05/02/2021   Procedure: HEMOSTASIS CLIP PLACEMENT;  Surgeon: Arta Silence, MD;  [facility];  Service: Endoscopy;;   HERNIA REPAIR     INTRAOPERATIVE TRANSESOPHAGEAL ECHOCARDIOGRAM N/A 03/31/2014   Procedure: INTRAOPERATIVE TRANSESOPHAGEAL ECHOCARDIOGRAM;  Surgeon: Melrose Nakayama, MD;  Location: [place];  Service: Open Heart Surgery;  Laterality: N/A;   IR ANGIOGRAM SELECTIVE EACH ADDITIONAL VESSEL  06/16/2020   IR ANGIOGRAM SELECTIVE EACH ADDITIONAL VESSEL  06/16/2020   IR  ANGIOGRAM SELECTIVE EACH ADDITIONAL VESSEL  06/16/2020   IR ANGIOGRAM SELECTIVE EACH ADDITIONAL VESSEL  06/16/2020   IR ANGIOGRAM SELECTIVE EACH ADDITIONAL VESSEL  06/16/2020   IR ANGIOGRAM SELECTIVE EACH ADDITIONAL VESSEL  06/16/2020   IR ANGIOGRAM SELECTIVE EACH ADDITIONAL VESSEL  06/16/2020   IR ANGIOGRAM SELECTIVE EACH ADDITIONAL VESSEL  06/16/2020   IR ANGIOGRAM SELECTIVE EACH ADDITIONAL VESSEL  06/16/2020   IR ANGIOGRAM SELECTIVE EACH ADDITIONAL VESSEL  06/30/2020   IR ANGIOGRAM  SELECTIVE EACH ADDITIONAL VESSEL  06/30/2020   IR ANGIOGRAM VISCERAL SELECTIVE  06/16/2020   IR ANGIOGRAM VISCERAL SELECTIVE  06/30/2020   IR EMBO ARTERIAL NOT HEMORR HEMANG INC GUIDE ROADMAPPING  06/16/2020   IR EMBO TUMOR ORGAN ISCHEMIA INFARCT INC GUIDE ROADMAPPING  06/30/2020   IR EMBO VENOUS NOT HEMORR HEMANG  INC GUIDE ROADMAPPING  01/13/2021   IR IVUS EACH ADDITIONAL NON CORONARY VESSEL  01/13/2021   IR RADIOLOGIST EVAL & MGMT  05/06/2020   IR RADIOLOGIST EVAL & MGMT  05/20/2020   IR RADIOLOGIST EVAL & MGMT  05/25/2021   IR TIPS  01/13/2021   IR US GUIDE VASC ACCESS RIGHT  06/16/2020   IR US GUIDE VASC ACCESS RIGHT  06/30/2020   IR US GUIDE VASC ACCESS RIGHT  01/13/2021   LAPAROSCOPIC UMBILICAL HERNIA REPAIR W/ MESH  06-06-2011   LEFT HEART CATHETERIZATION WITH CORONARY ANGIOGRAM N/A 03/28/2014   Procedure: LEFT HEART CATHETERIZATION WITH CORONARY ANGIOGRAM;  Surgeon: Sinclair Grooms, MD;  Location: [facility] CATH LAB;  Service: Cardiovascular;  Laterality: N/A;   LIPOMA EXCISION Left 08/25/2017   Procedure: EXCISION LIPOMA LEFT POSTERIOR THIGH;  Surgeon: Clovis Riley, MD;  Location: [place];  Service: General;  Laterality: Left;   NEPHROLITHOTOMY  1990'S   OPEN APPENDECTOMY W/ PARTIAL CECECTOMY  05-16-2004   PERCUTANEOUS CORONARY STENT INTERVENTION (PCI-S) N/A 06/28/2012   Procedure: PERCUTANEOUS CORONARY STENT INTERVENTION (PCI-S);  Surgeon: Sherren Mocha, MD;  Location: [facility] CATH LAB;  Service: Cardiovascular;  Laterality: N/A;   POLYPECTOMY  11/20/2020   Procedure: POLYPECTOMY;  Surgeon: Ronald Lobo, MD;  [facility];  Service: Endoscopy;;   POLYPECTOMY  05/02/2021   Procedure: POLYPECTOMY;  Surgeon: Arta Silence, MD;  [facility];  Service: Endoscopy;;   RADIOLOGY WITH ANESTHESIA N/A 01/13/2021   Procedure: IR WITH ANESTHESIA - TIPS;  Surgeon: Suzette Battiest, MD;  Location: [place];  Service: Radiology;  Laterality: N/A;   Prior to Admission medications   Medication  Sig Start Date End Date Taking? Authorizing Provider  albuterol (VENTOLIN HFA) 108 (90 Base) MCG/ACT inhaler Inhale 2 puffs into the lungs every 6 (six) hours as needed for wheezing or shortness of breath. 03/01/21  Yes Aline August, MD  ANORO ELLIPTA 62.5-25 MCG/ACT AEPB Inhale 1 puff into the lungs daily. 07/25/21  Yes Mercy Riding, MD  furosemide (LASIX) 40 MG tablet Take 1 tablet (40 mg total) by mouth daily. 07/25/21 10/23/21 Yes Mercy Riding, MD  lactulose (CHRONULAC) 10 GM/15ML solution Take 45 mLs (30 g total) by mouth 3 (three) times daily. 09/07/21  Yes [name], MD  pantoprazole (PROTONIX) 40 MG tablet Take 1 tablet (40 mg total) by mouth 2 (two) times daily before a meal. 06/18/21  Yes Ghimire, Henreitta Leber, MD  spironolactone (ALDACTONE) 50 MG tablet Take 1 tablet (50 mg total) by mouth daily. 07/25/21  Yes Mercy Riding, MD  Accu-Chek Softclix Lancets lancets USE AS DIRECTED TO TEST BLOOD SUGAR FOUR TIMES DAILY 03/13/20  [name], MD  blood glucose meter kit and supplies Dispense based on patient and insurance preference. Use up to four times daily as directed. (FOR ICD-10 E10.9, E11.9). 03/12/20   [name], MD  ferrous sulfate 325 (65 FE) MG tablet Take 1 tablet (325 mg total) by mouth daily with breakfast. Patient not taking: Reported on 09/09/2021 05/06/21 08/04/21  [name], MD  rifaximin (XIFAXAN) 550 MG TABS tablet Take 1 tablet (550 mg total) by mouth 2 (two) times daily. Patient not taking: Reported on 09/09/2021 06/18/21   Jonetta Osgood, MD   Allergies  Allergen Reactions   Fluzone Quadrivalent [Influenza Vac Split Quad] Other (See Comments)    The patient stated, in 10/2020: "I am not taking any more flu shots. It liked to have killed me."    FAMILY HISTORY:  Family History  Problem Relation Age of Onset   Lung cancer Sister    Cancer Sister        lung   Cancer Mother    Cancer Father        died in his 64s.   Cancer  Brother        lung   Coronary artery disease Other    Diabetes Other    Colon cancer Other    Cancer Sister        lung   SOCIAL HISTORY:  reports that he quit smoking about 25 years ago. His smoking use included cigarettes. He has a 80.00 pack-year smoking history. He has never used smokeless tobacco. He reports that he does not currently use alcohol after a past usage of about 8.0 standard drinks per week. He reports that he does not use drugs.  REVIEW OF SYSTEMS:   Reports blood in stool  Constitutional: Negative for fever, chills, weight loss, malaise/fatigue and diaphoresis.  HENT: Negative for hearing loss, ear pain, nosebleeds, congestion, sore throat, neck pain, tinnitus and ear discharge.   Eyes: Negative for blurred vision, double vision, photophobia, pain, discharge and redness.  Respiratory: Negative for cough, hemoptysis, sputum production, shortness of breath, wheezing and stridor.   Cardiovascular: Negative for chest pain, palpitations, orthopnea, claudication, leg swelling and PND.  Gastrointestinal: Negative for heartburn, nausea, vomiting, abdominal pain, diarrhea, constipation Genitourinary: Negative for dysuria, urgency, frequency, hematuria and flank pain.  Musculoskeletal: Negative for myalgias, back pain, joint pain and falls.  Skin: Negative for itching and rash.  Neurological: Negative for dizziness, tingling, tremors, sensory change, speech change, focal weakness, seizures, loss of consciousness, weakness and headaches.  Endo/Heme/Allergies: Negative for environmental allergies and polydipsia. Does not bruise/bleed easily.  SUBJECTIVE:   VITAL SIGNS: Temp:  [97.8 F (36.6 C)-98 F (36.7 C)] 98 F (36.7 C) (01/14 1111) Pulse Rate:  [61-67] 63 (01/14 1111) Resp:  [17-18] 18 (01/14 1111) BP: (111-127)/(46-53) 112/52 (01/14 1111) SpO2:  [97 %-100 %] 100 % (01/14 1111) Weight:  [83.5 kg] 83.5 kg (01/14 0539)  PHYSICAL EXAMINATION: Gen. Pleasant,  well-nourished, in no distress, normal affect ENT - no lesions, no post nasal drip Neck: No JVD, no thyromegaly, no carotid bruits Lungs: no use of accessory muscles, no dullness to percussion, decreased breath sounds right base without rales or rhonchi  Cardiovascular: Rhythm regular, heart sounds  normal, no murmurs, no peripheral edema Abdomen: soft and non-tender, no hepatosplenomegaly, BS normal. Musculoskeletal: No deformities, no cyanosis or clubbing Neuro:  alert, non focal , oriented x3 but mild confusion present when detailed questions asked Skin:  Warm, no lesions/ rash  Recent Labs  Lab 09/09/21 0309 09/10/21 0431 09/11/21 0315  NA 133* 137 136  K 3.8 3.3* 3.6  CL 109 109 106  CO2 19* 18* 20*  BUN _0 CREATININE 1.15 1.40* 1.18  GLUCOSE 111* 166* 97   Recent Labs  Lab 09/10/21 0431 09/10/21 1654 09/11/21 0929  HGB 7.9* 7.6* 8.9*  HCT 25.6* 24.1* 28.5*  WBC 2.9* 4.3 3.6*  PLT 92* PLATELET CLUMPS NOTED ON SMEAR, UNABLE TO ESTIMATE PLATELET CLUMPS NOTED ON SMEAR, UNABLE TO ESTIMATE   CT CHEST WO CONTRAST  Result Date: 09/10/2021 CLINICAL DATA:  Abnormal chest x-ray. Lung opacities. History of CHF. EXAM: CT CHEST WITHOUT CONTRAST TECHNIQUE: Multidetector CT imaging of the chest was performed following the standard protocol without IV contrast. RADIATION DOSE REDUCTION: This exam was performed according to the departmental dose-optimization program which includes automated exposure control, adjustment of the mA and/or kV according to patient size and/or use of iterative reconstruction technique. COMPARISON:  CT abdomen and pelvis from yesterday. CTA chest dated December 27, 2019. CT chest dated October 01, 2017. FINDINGS: Cardiovascular: Unchanged mild cardiomegaly. No pericardial effusion. No thoracic aortic aneurysm. Coronary, aortic arch, and branch vessel atherosclerotic vascular disease. Mediastinum/Nodes: New mildly enlarged right paratracheal lymph nodes  measuring up to 1.4 cm in short axis (series 3, image 49). New subcarinal lymphadenopathy measuring up to 2.1 cm in short axis (series 3, image 78). Unchanged calcified mediastinal and bilateral hilar lymph nodes. No enlarged axillary lymph nodes. The thyroid gland, trachea, and esophagus demonstrate no significant findings. Lungs/Pleura: Small to moderate right and trace left pleural effusions. Round consolidation in the posterior right lower lobe is unchanged compared to yesterday's CT, but new since April 2021. Diffuse central peribronchial thickening and smooth interlobular septal thickening throughout both lungs. 7 mm nodule in the anterior right upper lobe (series 4, image 66), unchanged since April 2021, benign. No pneumothorax. Upper Abdomen: No acute abnormality.  No change from yesterday's CT. Musculoskeletal: No acute or significant osseous findings. IMPRESSION: 1. Round mass-like consolidation in the posterior right lower lobe is unchanged compared to yesterday's CT, but new since June 2022. In light of the new mediastinal lymphadenopathy and patient's smoking history, findings are concerning for primary bronchogenic neoplasm. Consultation with pulmonary medicine or thoracic surgery is suggested, as clinically appropriate. 2. Small to moderate right and trace left pleural effusions with mild to moderate interstitial pulmonary edema. 3. Aortic Atherosclerosis (ICD10-I70.0). Electronically Signed   By: Titus Dubin M.D.   On: 09/10/2021 09:14   CT ABDOMEN PELVIS W CONTRAST  Result Date: 09/09/2021 CLINICAL DATA:  Cirrhosis. Hepatocellular carcinoma. Previous TIPS. Upper GI bleeding. Weakness and confusion. EXAM: CT ABDOMEN AND PELVIS WITH CONTRAST TECHNIQUE: Multidetector CT imaging of the abdomen and pelvis was performed using the standard protocol following bolus administration of intravenous contrast. CONTRAST:  150m OMNIPAQUE IOHEXOL 300 MG/ML  SOLN COMPARISON:  Noncontrast CT on 01/29/2021  FINDINGS: Lower Chest: New moderate right pleural effusion and tiny left pleural effusion are seen. New rounded area of consolidation in the posterior right lower lobe measures 3 x 4 cm, and may be inflammatory, infectious, or neoplastic in etiology. Hepatobiliary: Hepatic cirrhosis again demonstrated. A subcapsular cyst measuring 3 cm in the lateral liver dome remains stable. No suspicious liver lesions are identified. TIPS is again seen, with patent portal veins and IVC. Hepatic veins are also patent. Gallbladder is unremarkable. No evidence of biliary ductal dilatation. Pancreas:  No mass or inflammatory changes. Spleen: Within normal  limits in size. A few tiny cysts and calcified granulomata are stable. No masses identified. Adrenals/Urinary Tract: No masses identified. Diffuse right renal atrophy is seen as well as a 13 mm right renal calculus. No evidence of ureteral calculi or hydronephrosis. Urinary bladder is distended but otherwise unremarkable in appearance. Stomach/Bowel: No evidence of obstruction, inflammatory process or abnormal fluid collections. Large stool burden seen throughout the colon. Vascular/Lymphatic: No pathologically enlarged lymph nodes. No acute vascular findings. Aortic atherosclerotic calcification noted. Recanalization of paraumbilical veins and esophageal varices again seen, consistent with portal venous hypertension. Reproductive:  No mass or other significant abnormality. Other:  Mild diffuse body wall edema, but no evidence ascites. Musculoskeletal:  No suspicious bone lesions identified. : New moderate right pleural effusion and tiny left pleural effusion. New masslike consolidation in posterior right lower lobe, which may be inflammatory, infectious, or neoplastic in etiology. Consider chest CT for further evaluation. No acute findings in the abdomen or pelvis. Hepatic cirrhosis and findings of portal venous hypertension. No evidence of recurrent hepatocellular carcinoma or  metastatic disease. Large stool burden noted; recommend clinical correlation for possible constipation. Right renal atrophy and nephrolithiasis. No evidence of ureteral calculi or hydronephrosis. Aortic Atherosclerosis (ICD10-I70.0). Electronically Signed   By: Marlaine Hind M.D.   On: 09/09/2021 15:13    ASSESSMENT / PLAN:  Right pleural effusion Right lower lobe lung mass -differential includes loculated effusion New mediastinal lymphadenopathy Previous calcified mediastinal lymphadenopathy   -Bedside thoracentesis was performed with removal of 1300 cc of fluid, this will be sent for cell count, chemistry and cytology.  Postprocedure chest x-ray was reviewed which shows no residual fluid and no pneumothorax -We will plan for outpatient PET scan to further investigate, he does seem to have previous calcified mediastinal lymphadenopathy but certainly presence of new mass and paratracheal/subcarinal lymphadenopathy is concerning for new lung malignancy -Based on results of PET scan and overall goals of care, we can decide whether he needs bronchoscopy/biopsy  Cirrhosis/portal hypertension/upper GI bleed/hepatic encephalopathy -Being managed by primary service -Given recurrent hospitalization for hepatic encephalopathy and decompensated cirrhosis, prognosis is noted to be guarded.  Palliative care consultation has been requested and management of right lower lobe mass will have to be done in the context of overall goals of care  PCCM will be available as needed. Outpatient PET scan will be scheduled  Kara Mead MD. FCCP. [facility] Pager [phone] If no response call [phone]    09/11/2021, 1:36 PM

## 2021-09-11 NOTE — Progress Notes (Addendum)
Patient ID: [name], male   DOB: [birth year], 76 y.o.   MRN: [id number]  PROGRESS NOTE    [name]  OEU:[id number] DOB: 07-02-1946 DOA: 09/08/2021 PCP: [name], MD   Brief Narrative:  76 y.o. male with medical history significant for hepatocellular carcinoma status post Y 90 treatment and coil embolization, cirrhosis of liver with ascites status post TIPS and embolization of gastric varices, portal hypertension, chronic pancytopenia, upper GI bleed, GERD, recurrent recent hospitalizations for hepatic encephalopathy presented on 09/08/2021 with generalized weakness.  He was found to have hemoglobin of 5.2.  GI was consulted.  He was started on Protonix and octreotide drips.  He was transfused with packed red cells.  He was managed conservatively as per GI recommendations.  Assessment & Plan:   Possible acute upper GI bleeding in a patient with history of upper GI bleed/variceal bleed requiring embolization of gastric varices Acute on chronic blood loss anemia Generalized weakness -Presented with hemoglobin of 5.2 (hemoglobin was 9 on 08/12/2021) -Status post 2 units packed red cells transfusion during this hospitalization. -Initially started on Protonix and octreotide drips and GI was consulted.  No overt GI bleed noted.   -patient's recent EGD was done on 05/18/2021 by Dr. [name] and that showed Normal esophagus, portal hypertensive gastropathy, type I isolated gastric varices without bleeding, multiple nonbleeding angioectasias in the stomach, few small proximal duodenal AVMs.  Estimated over 15 gastric AVMs as well as mild to moderate gastropathy.  Colonoscopy showed Hemorrhoids, ulcerated mucosa in the rectum and rectosigmoid, diverticulosis in sigmoid and descending colon, single nonbleeding colonic angiectasia (clip was placed), few nonbleeding colonic angioectasias. -During this hospitalization: GI recommended conservative management.  GI recommended Protonix 40 mg p.o.  twice daily along with oral iron supplementation.  GI signed off on 09/10/2021 with outpatient follow-up with GI. -Hemoglobin 8.9 today. -Currently on clear liquid diet.  Advance diet to soft diet with fluid restriction.  Decompensated cirrhosis of liver with ascites status post history of TIPS Hepatic encephalopathy in a patient with history of recurrent admissions for hepatic encephalopathy -Continue rifaximin and lactulose. -Lasix and Aldactone held because of acute kidney injury.  Will resume diuretics and repeat a.m. creatinine. -Strict input and output.  Daily weights.  Fluid restriction.  Acute kidney injury -Creatinine 1.4 on 09/10/2021.  Diuretics subsequently held.  Creatinine is improved to 1.18 today. -Repeat a.m. labs  Hypokalemia -Improved  Pancytopenia -From cirrhosis of liver.  Platelets on the lower side but stable.  New right lower lobe mass -CT of the chest done on 09/10/2021 showed new right lower lobe mass: Suspicious for malignancy.  I have consulted pulmonary: Follow recommendations.  History of hepatocellular carcinoma status post Y 90 treatment and coil embolization -Outpatient follow-up with oncology  Generalized deconditioning -PT/OT recommended SNF placement.  Family refused. -Overall prognosis is very poor given recurrent hospitalizations for hepatic encephalopathy, decompensated cirrhosis of liver with pancytopenia and ascites and new diagnosis of lung mass with concerns for malignancy.  Currently listed as full code.  I will request palliative care consultation for goals of care discussion.   DVT prophylaxis: SCDs Code Status: Full Family Communication: Called son/[name] on phone on 09/11/21 but he didn't pick up Disposition Plan: Status is: Inpatient  Remains inpatient appropriate because: Of need for pulmonary evaluation.  Will need to restart diuretics and follow-up creatinine.  Possible discharge in 1 to 2 days if creatinine remains stable on  diuretics.  Palliative care consultation   Consultants: GI.  PCCM.  Consult palliative care  Procedures: None  Antimicrobials: None   Subjective: Patient seen and examined at bedside.  Poor historian.  Slow to respond.  Feels hungry.  Denies worsening fever, nausea or vomiting.  Still feels weak.  Objective: Vitals:   09/10/21 1301 09/10/21 2038 09/11/21 0539 09/11/21 1111  BP: (!) 119/51 (!) 111/46 (!) 127/53 (!) 112/52  Pulse: 65 61 67 63  Resp: 18 17 18 18   Temp: 97.9 F (36.6 C) 97.8 F (36.6 C) 97.8 F (36.6 C) 98 F (36.7 C)  TempSrc: Oral Oral Oral Oral  SpO2: 100% 100% 97% 100%  Weight:   83.5 kg   Height:        Intake/Output Summary (Last 24 hours) at 09/11/2021 1111 Last data filed at 09/11/2021 0934 Gross per 24 hour  Intake 550.63 ml  Output 900 ml  Net -349.37 ml   Filed Weights   09/08/21 1713 09/11/21 0539  Weight: 91 kg 83.5 kg    Examination:  General exam: Appears calm and comfortable.  Looks extremely deconditioned and chronically ill.  Currently on room air. Respiratory system: Bilateral decreased breath sounds at bases with scattered crackles Cardiovascular system: S1 & S2 heard, Rate controlled Gastrointestinal system: Abdomen is distended, soft and nontender. Normal bowel sounds heard. Extremities: No cyanosis, clubbing; trace lower extremity edema Central nervous system: Awake, very slow to respond.  No focal neurological deficits. Moving extremities Skin: No obvious ecchymosis/lesions  psychiatry: Affect is extremely flat.  Does not participate in conversation much.   Data Reviewed: I have personally reviewed following labs and imaging studies  CBC: Recent Labs  Lab 09/09/21 1427 09/09/21 1647 09/10/21 0431 09/10/21 1654 09/11/21 0929  WBC 2.7* 5.1 2.9* 4.3 3.6*  NEUTROABS 1.9 3.0 2.0 2.9 2.3  HGB 7.3* 8.6* 7.9* 7.6* 8.9*  HCT 23.1* 26.9* 25.6* 24.1* 28.5*  MCV 82.8 85.1 83.4 82.3 85.1  PLT 91* 94* 92* PLATELET CLUMPS NOTED  ON SMEAR, UNABLE TO ESTIMATE PLATELET CLUMPS NOTED ON SMEAR, UNABLE TO ESTIMATE   Basic Metabolic Panel: Recent Labs  Lab 09/08/21 1720 09/09/21 0309 09/10/21 0431 09/11/21 0315  NA 135 133* 137 136  K 3.8 3.8 3.3* 3.6  CL 106 109 109 106  CO2 17* 19* 18* 20*  GLUCOSE 129* 111* 166* 97  BUN 21 20 20 15   CREATININE 1.17 1.15 1.40* 1.18  CALCIUM 8.1* 7.8* 8.0* 8.2*  MG  --  2.1   2.1  --   --    GFR: Estimated Creatinine Clearance: 55.9 mL/min (by C-G formula based on SCr of 1.18 mg/dL). Liver Function Tests: Recent Labs  Lab 09/08/21 1720 09/09/21 0309 09/10/21 0431 09/11/21 0315  AST 42* 34 35 31  ALT 22 21 22 21   ALKPHOS 115 120 117 115  BILITOT 1.2 1.4* 1.3* 1.3*  PROT 5.3* 5.1* 5.2* 5.5*  ALBUMIN 2.3* 2.1* 2.2* 2.5*   Recent Labs  Lab 09/08/21 1720  LIPASE 32   Recent Labs  Lab 09/08/21 1720 09/10/21 0602 09/11/21 0315  AMMONIA 137* 134* 82*   Coagulation Profile: Recent Labs  Lab 09/09/21 0309  INR 1.3*   Cardiac Enzymes: No results for input(s): CKTOTAL, CKMB, CKMBINDEX, TROPONINI in the last 168 hours. BNP (last 3 results) No results for input(s): PROBNP in the last 8760 hours. HbA1C: No results for input(s): HGBA1C in the last 72 hours. CBG: No results for input(s): GLUCAP in the last 168 hours. Lipid Profile: No results for input(s): CHOL, HDL, LDLCALC, TRIG, CHOLHDL, LDLDIRECT  in the last 72 hours. Thyroid Function Tests: Recent Labs    09/09/21 0309  TSH 1.210   Anemia Panel: Recent Labs    09/09/21 0309  FOLATE 11.2  FERRITIN 15*  TIBC 328  IRON 39*  RETICCTPCT 2.4   Sepsis Labs: Recent Labs  Lab 09/08/21 1720 09/08/21 1907  LATICACIDVEN 2.7* 2.0*    Recent Results (from the past 240 hour(s))  Resp Panel by RT-PCR (Flu A&B, Covid) Nasopharyngeal Swab     Status: None   Collection Time: 09/08/21  5:08 PM   Specimen: Nasopharyngeal Swab; Nasopharyngeal(NP) swabs in vial transport medium  Result Value Ref Range Status    SARS Coronavirus 2 by RT PCR NEGATIVE NEGATIVE Final    Comment: (NOTE) SARS-CoV-2 target nucleic acids are NOT DETECTED.  The SARS-CoV-2 RNA is generally detectable in upper respiratory specimens during the acute phase of infection. The lowest concentration of SARS-CoV-2 viral copies this assay can detect is 138 copies/mL. A negative result does not preclude SARS-Cov-2 infection and should not be used as the sole basis for treatment or other patient management decisions. A negative result may occur with  improper specimen collection/handling, submission of specimen other than nasopharyngeal swab, presence of viral mutation(s) within the areas targeted by this assay, and inadequate number of viral copies(<138 copies/mL). A negative result must be combined with clinical observations, patient history, and epidemiological information. The expected result is Negative.  Fact Sheet for Patients:  EntrepreneurPulse.com.au  Fact Sheet for Healthcare Providers:  IncredibleEmployment.be  This test is no t yet approved or cleared by the [place] FDA and  has been authorized for detection and/or diagnosis of SARS-CoV-2 by FDA under an Emergency Use Authorization (EUA). This EUA will remain  in effect (meaning this test can be used) for the duration of the COVID-19 declaration under Section 564(b)(1) of the Act, 21 U.S.C.section 360bbb-3(b)(1), unless the authorization is terminated  or revoked sooner.       Influenza A by PCR NEGATIVE NEGATIVE Final   Influenza B by PCR NEGATIVE NEGATIVE Final    Comment: (NOTE) The Xpert Xpress SARS-CoV-2/FLU/RSV plus assay is intended as an aid in the diagnosis of influenza from Nasopharyngeal swab specimens and should not be used as a sole basis for treatment. Nasal washings and aspirates are unacceptable for Xpert Xpress SARS-CoV-2/FLU/RSV testing.  Fact Sheet for  Patients: EntrepreneurPulse.com.au  Fact Sheet for Healthcare Providers: IncredibleEmployment.be  This test is not yet approved or cleared by the [place] FDA and has been authorized for detection and/or diagnosis of SARS-CoV-2 by FDA under an Emergency Use Authorization (EUA). This EUA will remain in effect (meaning this test can be used) for the duration of the COVID-19 declaration under Section 564(b)(1) of the Act, 21 U.S.C. section 360bbb-3(b)(1), unless the authorization is terminated or revoked.  Performed at [facility], [place] [address] [place], [place] [address]          Radiology Studies: CT CHEST WO CONTRAST  Result Date: 09/10/2021 CLINICAL DATA:  Abnormal chest x-ray. Lung opacities. History of CHF. EXAM: CT CHEST WITHOUT CONTRAST TECHNIQUE: Multidetector CT imaging of the chest was performed following the standard protocol without IV contrast. RADIATION DOSE REDUCTION: This exam was performed according to the departmental dose-optimization program which includes automated exposure control, adjustment of the mA and/or kV according to patient size and/or use of iterative reconstruction technique. COMPARISON:  CT abdomen and pelvis from yesterday. CTA chest dated December 27, 2019. CT chest dated October 01, 2017. FINDINGS: Cardiovascular: Unchanged mild cardiomegaly. No pericardial effusion. No thoracic aortic aneurysm. Coronary, aortic arch, and branch vessel atherosclerotic vascular disease. Mediastinum/Nodes: New mildly enlarged right paratracheal lymph nodes measuring up to 1.4 cm in short axis (series 3, image 49). New subcarinal lymphadenopathy measuring up to 2.1 cm in short axis (series 3, image 78). Unchanged calcified mediastinal and bilateral hilar lymph nodes. No enlarged axillary lymph nodes. The thyroid gland, trachea, and esophagus demonstrate no significant findings. Lungs/Pleura: Small to moderate right and trace left  pleural effusions. Round consolidation in the posterior right lower lobe is unchanged compared to yesterday's CT, but new since April 2021. Diffuse central peribronchial thickening and smooth interlobular septal thickening throughout both lungs. 7 mm nodule in the anterior right upper lobe (series 4, image 66), unchanged since April 2021, benign. No pneumothorax. Upper Abdomen: No acute abnormality.  No change from yesterday's CT. Musculoskeletal: No acute or significant osseous findings. IMPRESSION: 1. Round mass-like consolidation in the posterior right lower lobe is unchanged compared to yesterday's CT, but new since June 2022. In light of the new mediastinal lymphadenopathy and patient's smoking history, findings are concerning for primary bronchogenic neoplasm. Consultation with pulmonary medicine or thoracic surgery is suggested, as clinically appropriate. 2. Small to moderate right and trace left pleural effusions with mild to moderate interstitial pulmonary edema. 3. Aortic Atherosclerosis (ICD10-I70.0). Electronically Signed   By: Titus Dubin M.D.   On: 09/10/2021 09:14   CT ABDOMEN PELVIS W CONTRAST  Result Date: 09/09/2021 CLINICAL DATA:  Cirrhosis. Hepatocellular carcinoma. Previous TIPS. Upper GI bleeding. Weakness and confusion. EXAM: CT ABDOMEN AND PELVIS WITH CONTRAST TECHNIQUE: Multidetector CT imaging of the abdomen and pelvis was performed using the standard protocol following bolus administration of intravenous contrast. CONTRAST:  150mL OMNIPAQUE IOHEXOL 300 MG/ML  SOLN COMPARISON:  Noncontrast CT on 01/29/2021 FINDINGS: Lower Chest: New moderate right pleural effusion and tiny left pleural effusion are seen. New rounded area of consolidation in the posterior right lower lobe measures 3 x 4 cm, and may be inflammatory, infectious, or neoplastic in etiology. Hepatobiliary: Hepatic cirrhosis again demonstrated. A subcapsular cyst measuring 3 cm in the lateral liver dome remains stable. No  suspicious liver lesions are identified. TIPS is again seen, with patent portal veins and IVC. Hepatic veins are also patent. Gallbladder is unremarkable. No evidence of biliary ductal dilatation. Pancreas:  No mass or inflammatory changes. Spleen: Within normal limits in size. A few tiny cysts and calcified granulomata are stable. No masses identified. Adrenals/Urinary Tract: No masses identified. Diffuse right renal atrophy is seen as well as a 13 mm right renal calculus. No evidence of ureteral calculi or hydronephrosis. Urinary bladder is distended but otherwise unremarkable in appearance. Stomach/Bowel: No evidence of obstruction, inflammatory process or abnormal fluid collections. Large stool burden seen throughout the colon. Vascular/Lymphatic: No pathologically enlarged lymph nodes. No acute vascular findings. Aortic atherosclerotic calcification noted. Recanalization of paraumbilical veins and esophageal varices again seen, consistent with portal venous hypertension. Reproductive:  No mass or other significant abnormality. Other:  Mild diffuse body wall edema, but no evidence ascites. Musculoskeletal:  No suspicious bone lesions identified. : New moderate right pleural effusion and tiny left pleural effusion. New masslike consolidation in posterior right lower lobe, which may be inflammatory, infectious, or neoplastic in etiology. Consider chest CT for further evaluation. No acute findings in the abdomen or pelvis. Hepatic cirrhosis and findings of portal venous hypertension. No evidence of recurrent hepatocellular carcinoma or metastatic disease. Large stool burden noted;  recommend clinical correlation for possible constipation. Right renal atrophy and nephrolithiasis. No evidence of ureteral calculi or hydronephrosis. Aortic Atherosclerosis (ICD10-I70.0). Electronically Signed   By: Marlaine Hind M.D.   On: 09/09/2021 15:13        Scheduled Meds:  ferrous sulfate  325 mg Oral BID WC   lactulose  30  g Oral TID   lactulose  300 mL Rectal Once   mouth rinse  15 mL Mouth Rinse BID   pantoprazole  40 mg Oral BID AC   rifaximin  550 mg Oral BID   umeclidinium-vilanterol  1 puff Inhalation Daily   Continuous Infusions:        Aline August, MD [facility] 09/11/2021, 11:11 AM

## 2021-09-11 NOTE — Telephone Encounter (Signed)
This patient was seen in the hospital as consult for right pleural effusion and lung mass with mediastinal lymphadenopathy Please schedule outpatient PET scan , next available Follow-up office visit with me/APP in 4 weeks after PET scan

## 2021-09-11 NOTE — Progress Notes (Signed)
Mobility Specialist Progress Note    09/11/21 1218  Mobility  Activity Ambulated in hall  Level of Assistance Minimal assist, patient does 75% or more  Assistive Device Front wheel walker  Distance Ambulated (ft) 135 ft  Mobility Ambulated with assistance in hallway  Mobility Response Tolerated fair  Mobility performed by Mobility specialist  $Mobility charge 1 Mobility   Pre-Mobility: 65 HR, 103/45 BP, 100% SpO2  Pt received in bed and agreeable. Required some cueing for gait. Returned to bed with call bell in reach.   [facility] Mobility Specialist  M.S. 2C and 6E: [phone] M.S. 4E: [phone]

## 2021-09-12 DIAGNOSIS — K922 Gastrointestinal hemorrhage, unspecified: Secondary | ICD-10-CM | POA: Diagnosis not present

## 2021-09-12 DIAGNOSIS — K7682 Hepatic encephalopathy: Secondary | ICD-10-CM | POA: Diagnosis not present

## 2021-09-12 DIAGNOSIS — K7460 Unspecified cirrhosis of liver: Secondary | ICD-10-CM | POA: Diagnosis not present

## 2021-09-12 DIAGNOSIS — R918 Other nonspecific abnormal finding of lung field: Secondary | ICD-10-CM | POA: Diagnosis not present

## 2021-09-12 LAB — CBC WITH DIFFERENTIAL/PLATELET
Abs Immature Granulocytes: 0.01 10*3/uL (ref 0.00–0.07)
Basophils Absolute: 0 10*3/uL (ref 0.0–0.1)
Basophils Relative: 1 %
Eosinophils Absolute: 0.2 10*3/uL (ref 0.0–0.5)
Eosinophils Relative: 6 %
HCT: 24.3 % — ABNORMAL LOW (ref 39.0–52.0)
Hemoglobin: 7.4 g/dL — ABNORMAL LOW (ref 13.0–17.0)
Immature Granulocytes: 0 %
Lymphocytes Relative: 17 %
Lymphs Abs: 0.6 10*3/uL — ABNORMAL LOW (ref 0.7–4.0)
MCH: 26 pg (ref 26.0–34.0)
MCHC: 30.5 g/dL (ref 30.0–36.0)
MCV: 85.3 fL (ref 80.0–100.0)
Monocytes Absolute: 0.4 10*3/uL (ref 0.1–1.0)
Monocytes Relative: 11 %
Neutro Abs: 2.2 10*3/uL (ref 1.7–7.7)
Neutrophils Relative %: 65 %
Platelets: 93 10*3/uL — ABNORMAL LOW (ref 150–400)
RBC: 2.85 MIL/uL — ABNORMAL LOW (ref 4.22–5.81)
RDW: 16 % — ABNORMAL HIGH (ref 11.5–15.5)
WBC: 3.4 10*3/uL — ABNORMAL LOW (ref 4.0–10.5)
nRBC: 0.6 % — ABNORMAL HIGH (ref 0.0–0.2)

## 2021-09-12 LAB — COMPREHENSIVE METABOLIC PANEL
ALT: 20 U/L (ref 0–44)
AST: 29 U/L (ref 15–41)
Albumin: 2.2 g/dL — ABNORMAL LOW (ref 3.5–5.0)
Alkaline Phosphatase: 120 U/L (ref 38–126)
Anion gap: 8 (ref 5–15)
BUN: 13 mg/dL (ref 8–23)
CO2: 21 mmol/L — ABNORMAL LOW (ref 22–32)
Calcium: 7.9 mg/dL — ABNORMAL LOW (ref 8.9–10.3)
Chloride: 110 mmol/L (ref 98–111)
Creatinine, Ser: 1.07 mg/dL (ref 0.61–1.24)
GFR, Estimated: >60 mL/min (ref >60)
Glucose, Bld: 91 mg/dL (ref 70–99)
Potassium: 3.9 mmol/L (ref 3.5–5.1)
Sodium: 139 mmol/L (ref 135–145)
Total Bilirubin: 1.1 mg/dL (ref 0.3–1.2)
Total Protein: 5.1 g/dL — ABNORMAL LOW (ref 6.5–8.1)

## 2021-09-12 LAB — METHYLMALONIC ACID, SERUM: Methylmalonic Acid, Quantitative: 161 nmol/L (ref 0–378)

## 2021-09-12 LAB — MAGNESIUM: Magnesium: 1.9 mg/dL (ref 1.7–2.4)

## 2021-09-12 MED ORDER — RIFAXIMIN 550 MG PO TABS: 550.0000 mg | ORAL_TABLET | Freq: Two times a day (BID) | ORAL | 0 refills | Status: AC

## 2021-09-12 NOTE — Progress Notes (Signed)
Palliative:  Palliative care consultation noted but patient is proceeding with discharge today and outpatient palliative consultation is recommended. I recommend referral to Royetta Asal, NP for outpatient palliative care if he plans to follow up at [facility]. I did not see patient.  No charge  Vinie Sill, NP Palliative Medicine Team Pager [phone] (Please see [email or web address] for schedule) Team Phone [phone]

## 2021-09-12 NOTE — TOC Transition Note (Signed)
Transition of Care (TOC) - CM/SW Discharge Note Marvetta Gibbons RN, BSN Transitions of Care Unit 4E- RN Case Manager See Treatment Team for direct phone #  Weekend cross coverage  Patient Details  Name: PARMINDER CUPPLES MRN: [id number] Date of Birth: 02-07-1946  Transition of Care [facility]) CM/SW Contact:  Dawayne Patricia, RN Phone Number: 09/12/2021, 11:56 AM   Clinical Narrative:    Pt stable for transition home today, orders placed for HHRn/PT/OT/aide- CM called and spoke with son to review HH delay in start of care per [place]. Per son [name] he verbalized that he is agreeable to delay in [facility] services and understands that nursing will plan to start around mid week this week with PT services to follow towards the end of the week.   Call made to [facility] and spoke with Alwyn Ren to confirm [facility] referral has been accepted and inform that son is ok with delay in start of care. [place] to f/u for scheduling of [facility] services.    Final next level of care: [place] Barriers to Discharge: Barriers Resolved   Patient Goals and CMS Choice Patient states their goals for this hospitalization and ongoing recovery are:: patient wants to return home CMS Medicare.gov Compare Post Acute Care list provided to:: Patient Represenative (must comment) (patients son [name]) Choice offered to / list presented to : Adult Children  Discharge Placement               Home w/ [facility]        Discharge Plan and Services In-house Referral: Clinical Social Work Discharge Planning Services: CM Consult Post Acute Care Choice: Home Health          DME Arranged: N/A DME Agency: NA       HH Arranged: RN, Disease Management, PT, OT, Nurse's Aide [place] Agency: [place] Date HH Agency Contacted: 09/12/21 Time [place]: [phone] Representative spoke with at [place]: [place] ([place]) Interventions     Readmission Risk  Interventions Readmission Risk Prevention Plan 09/11/2021 01/26/2021 08/12/2020  Transportation Screening Complete Complete -  PCP or Specialist Appt within 5-7 Days - - -  Not Complete comments - - -  PCP or Specialist Appt within 3-5 Days - - Complete  Home Care Screening - - -  Medication Review (RN CM) - - -  [place] or [place] for [place] - - -  Medication Review Press photographer) Complete Complete -  PCP or Specialist appointment within 3-5 days of discharge Complete Complete -  [place] or Home Care Consult Complete Complete -  SW Recovery Care/Counseling Consult Complete Complete -  Palliative Care Screening Not Applicable Not Applicable -  [place] Patient Refused Patient Refused -  Some recent data might be hidden

## 2021-09-12 NOTE — Discharge Summary (Signed)
Physician Discharge Summary  [name] BRA:[id number] DOB: 11-06-1945 DOA: 09/08/2021  PCP: [name], MD  Admit date: 09/08/2021 Discharge date: 09/12/2021  Admitted From: Home Disposition: Home  Recommendations for Outpatient Follow-up:  Follow up with PCP in 1 week with repeat CBC/CMP Outpatient follow-up with GI and pulmonary Outpatient evaluation and follow-up with palliative care Follow up in ED if symptoms worsen or new appear   Home Health: No Equipment/Devices: None  Discharge Condition: Stable CODE STATUS: DNR.  Spoke to son on the phone on 09/12/2021 and he confirms DNR status Diet recommendation: Heart healthy/fluid restriction of up to 1200 cc a day  Brief/Interim Summary: 76 y.o. male with medical history significant for hepatocellular carcinoma status post Y 90 treatment and coil embolization, cirrhosis of liver with ascites status post TIPS and embolization of gastric varices, portal hypertension, chronic pancytopenia, upper GI bleed, GERD, recurrent recent hospitalizations for hepatic encephalopathy presented on 09/08/2021 with generalized weakness.  He was found to have hemoglobin of 5.2.  GI was consulted.  He was started on Protonix and octreotide drips.  He was transfused with packed red cells.  He was managed conservatively as per GI recommendations.  He was also found to have new right lower lobe mass along with pleural effusion.  He underwent right-sided thoracentesis at bedside by PCCM.  PCCM recommended outpatient follow-up with outpatient PET scan.  His hemoglobin is currently stable.  He wants to go home.  He will be discharged home with close outpatient follow-up with PCP/GI/pulmonary.  Will benefit from palliative care evaluation and follow-up.  Discharge Diagnoses:   Possible acute upper GI bleeding in a patient with history of upper GI bleed/variceal bleed requiring embolization of gastric varices Acute on chronic blood loss anemia Generalized  weakness -Presented with hemoglobin of 5.2 (hemoglobin was 9 on 08/12/2021) -Status post 2 units packed red cells transfusion during this hospitalization. -Initially started on Protonix and octreotide drips and GI was consulted.  No overt GI bleed noted.   -patient's recent EGD was done on 05/18/2021 by Dr. [name] and that showed Normal esophagus, portal hypertensive gastropathy, type I isolated gastric varices without bleeding, multiple nonbleeding angioectasias in the stomach, few small proximal duodenal AVMs.  Estimated over 15 gastric AVMs as well as mild to moderate gastropathy.  Colonoscopy showed Hemorrhoids, ulcerated mucosa in the rectum and rectosigmoid, diverticulosis in sigmoid and descending colon, single nonbleeding colonic angiectasia (clip was placed), few nonbleeding colonic angioectasias. -During this hospitalization: GI recommended conservative management.  GI recommended Protonix 40 mg p.o. twice daily along with oral iron supplementation.  GI signed off on 09/10/2021 with outpatient follow-up with GI. -Hemoglobin 7.4 today. -Currently tolerating solid diet.  Decompensated cirrhosis of liver with ascites status post history of TIPS Hepatic encephalopathy in a patient with history of recurrent admissions for hepatic encephalopathy -Continue rifaximin and lactulose. -Lasix and Aldactone held on 09/10/2021 because of acute kidney injury but was resumed on 09/11/2021 once kidney function normalized. -Continue fluid and diet restriction.  Continue current diuretics upon discharge. -Discharge patient home today. -Overall prognosis is very poor.  After discussion with son on phone today on 09/12/2021: He has agreed for patient's CODE STATUS to be changed to DNR. -Palliative care consultation is pending: This can happen as an outpatient.   Acute kidney injury -Creatinine 1.4 on 09/10/2021.  Diuretics subsequently held.  Diuretic subsequently resumed on 09/11/2018.  Creatinine 1.07 today.   Outpatient follow-up.  Hypokalemia -Improved   Pancytopenia -From cirrhosis of liver.  Platelets on the lower side but stable.  Outpatient follow-up.   New right lower lobe mass with right pleural effusion -CT of the chest done on 09/10/2021 showed new right lower lobe mass: Suspicious for malignancy.   -He underwent right-sided thoracentesis at bedside by PCCM with removal of 1300 cc of fluid.  Pleural fluid cytology can be followed up as an outpatient by PCCM.Marland Kitchen  PCCM recommended outpatient follow-up with outpatient PET scan.   History of hepatocellular carcinoma status post Y 90 treatment and coil embolization -Outpatient follow-up with oncology   Generalized deconditioning -PT/OT recommended SNF placement.  Family refused. -Overall prognosis is very poor given recurrent hospitalizations for hepatic encephalopathy, decompensated cirrhosis of liver with pancytopenia and ascites and new diagnosis of lung mass with concerns for malignancy.   -Discussion as above.  Discharge Instructions  Discharge Instructions     Amb Referral to Palliative Care   Complete by: As directed    Ambulatory referral to Pulmonology   Complete by: As directed    Reason for referral: Lung Mass/Lung Nodule   Diet - low sodium heart healthy   Complete by: As directed    Increase activity slowly   Complete by: As directed       Allergies as of 09/12/2021       Reactions   Fluzone Quadrivalent [influenza Vac Split Quad] Other (See Comments)   The patient stated, in 10/2020: "I am not taking any more flu shots. It liked to have killed me."        Medication List     TAKE these medications    Accu-Chek Softclix Lancets lancets USE AS DIRECTED TO TEST BLOOD SUGAR FOUR TIMES DAILY   albuterol 108 (90 Base) MCG/ACT inhaler Commonly known as: VENTOLIN HFA Inhale 2 puffs into the lungs every 6 (six) hours as needed for wheezing or shortness of breath.   Anoro Ellipta 62.5-25 MCG/ACT Aepb Generic  drug: umeclidinium-vilanterol Inhale 1 puff into the lungs daily.   blood glucose meter kit and supplies Dispense based on patient and insurance preference. Use up to four times daily as directed. (FOR ICD-10 E10.9, E11.9).   ferrous sulfate 325 (65 FE) MG tablet Take 1 tablet (325 mg total) by mouth daily with breakfast.   furosemide 40 MG tablet Commonly known as: LASIX Take 1 tablet (40 mg total) by mouth daily.   lactulose 10 GM/15ML solution Commonly known as: CHRONULAC Take 45 mLs (30 g total) by mouth 3 (three) times daily.   pantoprazole 40 MG tablet Commonly known as: PROTONIX Take 1 tablet (40 mg total) by mouth 2 (two) times daily before a meal.   rifaximin 550 MG Tabs tablet Commonly known as: XIFAXAN Take 1 tablet (550 mg total) by mouth 2 (two) times daily.   spironolactone 50 MG tablet Commonly known as: ALDACTONE Take 1 tablet (50 mg total) by mouth daily.        Follow-up Information     [facility].   Specialty: Home Health Services Why: Registered Nurse, Physical and Occupational Therapy, Aide-office to call with visit times. Contact information: [address] [place] [address] [phone]                Allergies  Allergen Reactions   Fluzone Quadrivalent [Influenza Vac Split Quad] Other (See Comments)    The patient stated, in 10/2020: "I am not taking any more flu shots. It liked to have killed me."    Consultations: GI/pulmonary.  Palliative care evaluation pending   Procedures/Studies: CT CHEST WO CONTRAST  Result Date: 09/10/2021 CLINICAL DATA:  Abnormal chest x-ray. Lung opacities. History of CHF. EXAM: CT CHEST WITHOUT CONTRAST TECHNIQUE: Multidetector CT imaging of the chest was performed following the standard protocol without IV contrast. RADIATION DOSE REDUCTION: This exam was performed according to the departmental dose-optimization program which includes automated exposure control,  adjustment of the mA and/or kV according to patient size and/or use of iterative reconstruction technique. COMPARISON:  CT abdomen and pelvis from yesterday. CTA chest dated December 27, 2019. CT chest dated October 01, 2017. FINDINGS: Cardiovascular: Unchanged mild cardiomegaly. No pericardial effusion. No thoracic aortic aneurysm. Coronary, aortic arch, and branch vessel atherosclerotic vascular disease. Mediastinum/Nodes: New mildly enlarged right paratracheal lymph nodes measuring up to 1.4 cm in short axis (series 3, image 49). New subcarinal lymphadenopathy measuring up to 2.1 cm in short axis (series 3, image 78). Unchanged calcified mediastinal and bilateral hilar lymph nodes. No enlarged axillary lymph nodes. The thyroid gland, trachea, and esophagus demonstrate no significant findings. Lungs/Pleura: Small to moderate right and trace left pleural effusions. Round consolidation in the posterior right lower lobe is unchanged compared to yesterday's CT, but new since April 2021. Diffuse central peribronchial thickening and smooth interlobular septal thickening throughout both lungs. 7 mm nodule in the anterior right upper lobe (series 4, image 66), unchanged since April 2021, benign. No pneumothorax. Upper Abdomen: No acute abnormality.  No change from yesterday's CT. Musculoskeletal: No acute or significant osseous findings. IMPRESSION: 1. Round mass-like consolidation in the posterior right lower lobe is unchanged compared to yesterday's CT, but new since June 2022. In light of the new mediastinal lymphadenopathy and patient's smoking history, findings are concerning for primary bronchogenic neoplasm. Consultation with pulmonary medicine or thoracic surgery is suggested, as clinically appropriate. 2. Small to moderate right and trace left pleural effusions with mild to moderate interstitial pulmonary edema. 3. Aortic Atherosclerosis (ICD10-I70.0). Electronically Signed   By: Titus Dubin M.D.   On:  09/10/2021 09:14   CT ABDOMEN PELVIS W CONTRAST  Result Date: 09/09/2021 CLINICAL DATA:  Cirrhosis. Hepatocellular carcinoma. Previous TIPS. Upper GI bleeding. Weakness and confusion. EXAM: CT ABDOMEN AND PELVIS WITH CONTRAST TECHNIQUE: Multidetector CT imaging of the abdomen and pelvis was performed using the standard protocol following bolus administration of intravenous contrast. CONTRAST:  118m OMNIPAQUE IOHEXOL 300 MG/ML  SOLN COMPARISON:  Noncontrast CT on 01/29/2021 FINDINGS: Lower Chest: New moderate right pleural effusion and tiny left pleural effusion are seen. New rounded area of consolidation in the posterior right lower lobe measures 3 x 4 cm, and may be inflammatory, infectious, or neoplastic in etiology. Hepatobiliary: Hepatic cirrhosis again demonstrated. A subcapsular cyst measuring 3 cm in the lateral liver dome remains stable. No suspicious liver lesions are identified. TIPS is again seen, with patent portal veins and IVC. Hepatic veins are also patent. Gallbladder is unremarkable. No evidence of biliary ductal dilatation. Pancreas:  No mass or inflammatory changes. Spleen: Within normal limits in size. A few tiny cysts and calcified granulomata are stable. No masses identified. Adrenals/Urinary Tract: No masses identified. Diffuse right renal atrophy is seen as well as a 13 mm right renal calculus. No evidence of ureteral calculi or hydronephrosis. Urinary bladder is distended but otherwise unremarkable in appearance. Stomach/Bowel: No evidence of obstruction, inflammatory process or abnormal fluid collections. Large stool burden seen throughout the colon. Vascular/Lymphatic: No pathologically enlarged lymph nodes. No acute vascular findings. Aortic atherosclerotic calcification noted. Recanalization of paraumbilical  veins and esophageal varices again seen, consistent with portal venous hypertension. Reproductive:  No mass or other significant abnormality. Other:  Mild diffuse body wall  edema, but no evidence ascites. Musculoskeletal:  No suspicious bone lesions identified. : New moderate right pleural effusion and tiny left pleural effusion. New masslike consolidation in posterior right lower lobe, which may be inflammatory, infectious, or neoplastic in etiology. Consider chest CT for further evaluation. No acute findings in the abdomen or pelvis. Hepatic cirrhosis and findings of portal venous hypertension. No evidence of recurrent hepatocellular carcinoma or metastatic disease. Large stool burden noted; recommend clinical correlation for possible constipation. Right renal atrophy and nephrolithiasis. No evidence of ureteral calculi or hydronephrosis. Aortic Atherosclerosis (ICD10-I70.0). Electronically Signed   By: Marlaine Hind M.D.   On: 09/09/2021 15:13   DG CHEST PORT 1 VIEW  Result Date: 09/11/2021 CLINICAL DATA:  Altered mental status.  Shortness of breath EXAM: PORTABLE CHEST 1 VIEW COMPARISON:  09/08/2021, 09/10/2021 FINDINGS: Stable cardiomegaly status post CABG. Aortic atherosclerosis. Diffuse interstitial prominence compatible with edema. Known posterior right lower lobe mass-like consolidation is not well seen radiographically. Trace right pleural effusion. No pneumothorax. IMPRESSION: 1. Cardiomegaly with interstitial pulmonary edema. 2. Known posterior right lower lobe mass-like consolidation is not well seen radiographically. Electronically Signed   By: Davina Poke D.O.   On: 09/11/2021 14:30   DG Chest Port 1 View  Result Date: 09/08/2021 CLINICAL DATA:  Weakness, confused, disoriented, history of CHF EXAM: PORTABLE CHEST 1 VIEW COMPARISON:  Chest radiograph 05/28/2021 FINDINGS: Median sternotomy wires and mediastinal surgical clips are again noted. The heart is enlarged, unchanged. The mediastinal contours are grossly stable. There are diffusely increased interstitial markings throughout both lungs along with central vascular congestion. There is no focal  consolidation. There is no significant pleural effusion. There is no pneumothorax There is no acute osseous abnormality. IMPRESSION: Cardiomegaly with probable mild to moderate pulmonary interstitial edema. No focal consolidation or significant pleural effusion. Electronically Signed   By: Valetta Mole M.D.   On: 09/08/2021 18:10      Subjective: Patient seen and examined at bedside.  Poor historian, slow to respond but feels okay to go home today.  No overnight fever, vomiting, seizures reported.  Discharge Exam: Vitals:   09/12/21 0353 09/12/21 0933  BP: (!) 112/52 (!) 130/46  Pulse: 63 73  Resp: 17 17  Temp: 98 F (36.7 C) 98.3 F (36.8 C)  SpO2: 97% 97%    General: Pt is alert, awake, not in acute distress.  Looks chronically ill and deconditioned.  Currently on room air. Cardiovascular: rate controlled, S1/S2 + Respiratory: bilateral decreased breath sounds at bases with some crackles Abdominal: Soft, NT, distended, bowel sounds + Extremities: Bilateral lower extremity edema present; no cyanosis    The results of significant diagnostics from this hospitalization (including imaging, microbiology, ancillary and laboratory) are listed below for reference.     Microbiology: Recent Results (from the past 240 hour(s))  Resp Panel by RT-PCR (Flu A&B, Covid) Nasopharyngeal Swab     Status: None   Collection Time: 09/08/21  5:08 PM   Specimen: Nasopharyngeal Swab; Nasopharyngeal(NP) swabs in vial transport medium  Result Value Ref Range Status   SARS Coronavirus 2 by RT PCR NEGATIVE NEGATIVE Final    Comment: (NOTE) SARS-CoV-2 target nucleic acids are NOT DETECTED.  The SARS-CoV-2 RNA is generally detectable in upper respiratory specimens during the acute phase of infection. The lowest concentration of SARS-CoV-2 viral copies this assay can detect  is 138 copies/mL. A negative result does not preclude SARS-Cov-2 infection and should not be used as the sole basis for treatment  or other patient management decisions. A negative result may occur with  improper specimen collection/handling, submission of specimen other than nasopharyngeal swab, presence of viral mutation(s) within the areas targeted by this assay, and inadequate number of viral copies(<138 copies/mL). A negative result must be combined with clinical observations, patient history, and epidemiological information. The expected result is Negative.  Fact Sheet for Patients:  EntrepreneurPulse.com.au  Fact Sheet for Healthcare Providers:  IncredibleEmployment.be  This test is no t yet approved or cleared by the [place] FDA and  has been authorized for detection and/or diagnosis of SARS-CoV-2 by FDA under an Emergency Use Authorization (EUA). This EUA will remain  in effect (meaning this test can be used) for the duration of the COVID-19 declaration under Section 564(b)(1) of the Act, 21 U.S.C.section 360bbb-3(b)(1), unless the authorization is terminated  or revoked sooner.       Influenza A by PCR NEGATIVE NEGATIVE Final   Influenza B by PCR NEGATIVE NEGATIVE Final    Comment: (NOTE) The Xpert Xpress SARS-CoV-2/FLU/RSV plus assay is intended as an aid in the diagnosis of influenza from Nasopharyngeal swab specimens and should not be used as a sole basis for treatment. Nasal washings and aspirates are unacceptable for Xpert Xpress SARS-CoV-2/FLU/RSV testing.  Fact Sheet for Patients: EntrepreneurPulse.com.au  Fact Sheet for Healthcare Providers: IncredibleEmployment.be  This test is not yet approved or cleared by the [place] FDA and has been authorized for detection and/or diagnosis of SARS-CoV-2 by FDA under an Emergency Use Authorization (EUA). This EUA will remain in effect (meaning this test can be used) for the duration of the COVID-19 declaration under Section 564(b)(1) of the Act, 21 U.S.C. section  360bbb-3(b)(1), unless the authorization is terminated or revoked.  Performed at [facility], [place] [address], [place], [place] [address]   Body fluid culture w Gram Stain     Status: None (Preliminary result)   Collection Time: 09/11/21  1:53 PM   Specimen: Pleural Fluid  Result Value Ref Range Status   Specimen Description PLEURAL FLUID  Final   Special Requests NONE  Final   Gram Stain CYTOSPIN SMEAR FEW WBC SEEN NO ORGANISMS SEEN   Final   Culture   Final    NO GROWTH < 24 HOURS Performed at [facility], [place] [address], [place], [place] [address]    Report Status PENDING  Incomplete     Labs: BNP (last 3 results) Recent Labs    03/04/21 1310 04/28/21 1843 05/28/21 0125  BNP 432.3* 361.3* 704.8*   Basic Metabolic Panel: Recent Labs  Lab 09/08/21 1720 09/09/21 0309 09/10/21 0431 09/11/21 0315 09/12/21 0822  NA 135 133* 137 136 139  K 3.8 3.8 3.3* 3.6 3.9  CL 106 109 109 106 110  CO2 17* 19* 18* 20* 21*  GLUCOSE 129* 111* 166* 97 91  BUN _0 CREATININE 1.17 1.15 1.40* 1.18 1.07  CALCIUM 8.1* 7.8* 8.0* 8.2* 7.9*  MG  --  2.1   2.1  --   --  1.9   Liver Function Tests: Recent Labs  Lab 09/08/21 1720 09/09/21 0309 09/10/21 0431 09/11/21 0315 09/12/21 0822  AST 42* 34 35 31 29  ALT _1 ALKPHOS 115 120 117 115 120  BILITOT 1.2 1.4* 1.3* 1.3* 1.1  PROT 5.3*  5.1* 5.2* 5.5* 5.1*  ALBUMIN 2.3* 2.1* 2.2* 2.5* 2.2*   Recent Labs  Lab 09/08/21 1720  LIPASE 32   Recent Labs  Lab 09/08/21 1720 09/10/21 0602 09/11/21 0315  AMMONIA 137* 134* 82*   CBC: Recent Labs  Lab 09/09/21 1647 09/10/21 0431 09/10/21 1654 09/11/21 0929 09/12/21 0822  WBC 5.1 2.9* 4.3 3.6* 3.4*  NEUTROABS 3.0 2.0 2.9 2.3 2.2  HGB 8.6* 7.9* 7.6* 8.9* 7.4*  HCT 26.9* 25.6* 24.1* 28.5* 24.3*  MCV 85.1 83.4 82.3 85.1 85.3  PLT 94* 92* PLATELET CLUMPS NOTED ON SMEAR, UNABLE TO ESTIMATE PLATELET CLUMPS NOTED ON SMEAR, UNABLE TO ESTIMATE 93*    Cardiac Enzymes: No results for input(s): CKTOTAL, CKMB, CKMBINDEX, TROPONINI in the last 168 hours. BNP: Invalid input(s): POCBNP CBG: No results for input(s): GLUCAP in the last 168 hours. D-Dimer No results for input(s): DDIMER in the last 72 hours. Hgb A1c No results for input(s): HGBA1C in the last 72 hours. Lipid Profile No results for input(s): CHOL, HDL, LDLCALC, TRIG, CHOLHDL, LDLDIRECT in the last 72 hours. Thyroid function studies No results for input(s): TSH, T4TOTAL, T3FREE, THYROIDAB in the last 72 hours.  Invalid input(s): FREET3 Anemia work up No results for input(s): VITAMINB12, FOLATE, FERRITIN, TIBC, IRON, RETICCTPCT in the last 72 hours. Urinalysis    Component Value Date/Time   COLORURINE YELLOW 09/09/2021 0044   APPEARANCEUR CLEAR 09/09/2021 0044   LABSPEC 1.015 09/09/2021 0044   PHURINE 7.0 09/09/2021 0044   GLUCOSEU NEGATIVE 09/09/2021 0044   HGBUR NEGATIVE 09/09/2021 0044   BILIRUBINUR NEGATIVE 09/09/2021 0044   KETONESUR NEGATIVE 09/09/2021 0044   PROTEINUR NEGATIVE 09/09/2021 0044   UROBILINOGEN 0.2 03/29/2014 1546   NITRITE NEGATIVE 09/09/2021 0044   LEUKOCYTESUR SMALL (A) 09/09/2021 0044   Sepsis Labs Invalid input(s): PROCALCITONIN,  WBC,  LACTICIDVEN Microbiology Recent Results (from the past 240 hour(s))  Resp Panel by RT-PCR (Flu A&B, Covid) Nasopharyngeal Swab     Status: None   Collection Time: 09/08/21  5:08 PM   Specimen: Nasopharyngeal Swab; Nasopharyngeal(NP) swabs in vial transport medium  Result Value Ref Range Status   SARS Coronavirus 2 by RT PCR NEGATIVE NEGATIVE Final    Comment: (NOTE) SARS-CoV-2 target nucleic acids are NOT DETECTED.  The SARS-CoV-2 RNA is generally detectable in upper respiratory specimens during the acute phase of infection. The lowest concentration of SARS-CoV-2 viral copies this assay can detect is 138 copies/mL. A negative result does not preclude SARS-Cov-2 infection and should not be used as  the sole basis for treatment or other patient management decisions. A negative result may occur with  improper specimen collection/handling, submission of specimen other than nasopharyngeal swab, presence of viral mutation(s) within the areas targeted by this assay, and inadequate number of viral copies(<138 copies/mL). A negative result must be combined with clinical observations, patient history, and epidemiological information. The expected result is Negative.  Fact Sheet for Patients:  EntrepreneurPulse.com.au  Fact Sheet for Healthcare Providers:  IncredibleEmployment.be  This test is no t yet approved or cleared by the [place] FDA and  has been authorized for detection and/or diagnosis of SARS-CoV-2 by FDA under an Emergency Use Authorization (EUA). This EUA will remain  in effect (meaning this test can be used) for the duration of the COVID-19 declaration under Section 564(b)(1) of the Act, 21 U.S.C.section 360bbb-3(b)(1), unless the authorization is terminated  or revoked sooner.       Influenza A by PCR NEGATIVE NEGATIVE Final   Influenza B  by PCR NEGATIVE NEGATIVE Final    Comment: (NOTE) The Xpert Xpress SARS-CoV-2/FLU/RSV plus assay is intended as an aid in the diagnosis of influenza from Nasopharyngeal swab specimens and should not be used as a sole basis for treatment. Nasal washings and aspirates are unacceptable for Xpert Xpress SARS-CoV-2/FLU/RSV testing.  Fact Sheet for Patients: EntrepreneurPulse.com.au  Fact Sheet for Healthcare Providers: IncredibleEmployment.be  This test is not yet approved or cleared by the [place] FDA and has been authorized for detection and/or diagnosis of SARS-CoV-2 by FDA under an Emergency Use Authorization (EUA). This EUA will remain in effect (meaning this test can be used) for the duration of the COVID-19 declaration under Section 564(b)(1) of  the Act, 21 U.S.C. section 360bbb-3(b)(1), unless the authorization is terminated or revoked.  Performed at [facility], [place] [address], [place], [place] [address]   Body fluid culture w Gram Stain     Status: None (Preliminary result)   Collection Time: 09/11/21  1:53 PM   Specimen: Pleural Fluid  Result Value Ref Range Status   Specimen Description PLEURAL FLUID  Final   Special Requests NONE  Final   Gram Stain CYTOSPIN SMEAR FEW WBC SEEN NO ORGANISMS SEEN   Final   Culture   Final    NO GROWTH < 24 HOURS Performed at [facility], [place] [address], [place], [place] [address]    Report Status PENDING  Incomplete     Time coordinating discharge: 35 minutes  SIGNED:   Aline August, MD  [facility] 09/12/2021, 11:03 AM

## 2021-09-12 NOTE — TOC Progression Note (Signed)
Transition of Care (TOC) - Progression Note  Marvetta Gibbons RN, BSN Transitions of Care Unit 4E- RN Case Manager See Treatment Team for direct phone #  Weekend coverage  Patient Details  Name: [name] MRN: [id number] Date of Birth: 03-06-1946  Transition of Care [facility]) CM/SW Contact  Dahlia Client, Romeo Rabon, RN Phone Number: 09/12/2021, 8:03 AM  Clinical Narrative:    Received msg back from [place] with [facility]- earliest start of care for nursing would be mid week this week, and PT is out 5-7 days for staffing. Will f/u with patient and family to see if this timing will be acceptable once pt is ready for discharge. [facility] will follow, referral tentatively accepted.    Expected Discharge Plan: [place] Barriers to Discharge: Continued Medical Work up  Expected Discharge Plan and Services Expected Discharge Plan: [place] In-house Referral: Clinical Social Work Discharge Planning Services: CM Consult Post Acute Care Choice: [place] arrangements for the past 2 months: Single Family Home                 DME Arranged: N/A DME Agency: NA       HH Arranged: RN, Disease Management, PT, OT, Nurse's Aide [place] Agency: [place]: 09/11/21 Time [place]: [id number] Representative spoke with at [place]: [place] Interventions    Readmission Risk Interventions Readmission Risk Prevention Plan 09/11/2021 01/26/2021 08/12/2020  Transportation Screening Complete Complete -  PCP or Specialist Appt within 5-7 Days - - -  Not Complete comments - - -  PCP or Specialist Appt within 3-5 Days - - Complete  Home Care Screening - - -  Medication Review (RN CM) - - -  [place] or [place] for [place] - - -  Medication Review Press photographer) Complete Complete -  PCP  or Specialist appointment within 3-5 days of discharge Complete Complete -  [place] or Home Care Consult Complete Complete -  SW Recovery Care/Counseling Consult Complete Complete -  Palliative Care Screening Not Applicable Not Applicable -  [place] Patient Refused Patient Refused -  Some recent data might be hidden

## 2021-09-12 NOTE — Progress Notes (Signed)
Pt pending discharg. Discharge packet provided with teach-back method. VS wnL and as per flow. IVs removed, Pt verbalized understanding. All questions and concerns addressed. Awaiting on son for transport.

## 2021-09-13 NOTE — Telephone Encounter (Addendum)
ATC patient to get him scheduled for OV with Dr. Elsworth Soho and PET scan before, [facility]   When patient calls back please schedule him for CONSULT APPT with Dr. Elsworth Soho the week of 10/11/21.

## 2021-09-14 ENCOUNTER — Telehealth: Payer: Self-pay | Admitting: Pulmonary Disease

## 2021-09-14 DIAGNOSIS — J910 Malignant pleural effusion: Secondary | ICD-10-CM

## 2021-09-14 LAB — BODY FLUID CULTURE W GRAM STAIN: Culture: NO GROWTH

## 2021-09-14 LAB — CYTOLOGY - NON PAP

## 2021-09-14 NOTE — Telephone Encounter (Signed)
Called and spoke with patient's son per DPR to get him scheduled for OV after PET scan. PET scan has been scheduled for 10/04/21 and his ov is scheduled for 10/13/21. Nothing further needed at this time.   Next Appt With Pulmonology Davonna Belling V. Elsworth Soho, MD) 10/13/2021 at 10:30 AM

## 2021-09-14 NOTE — Telephone Encounter (Signed)
He was hosp with RT effusion, RLL mass & lymphadenopathy Pleural effusion shows adenocarcinoma. Scheduled for PET scan in 2 weeks. Can we please set up ONcology appt to discuss options please ?  He has seen dr Chryl Heck for pancytopenia in the past. Has cirrhosis with ascites  I have made several attempts to reach him & left a message for his son [name]

## 2021-09-15 ENCOUNTER — Telehealth: Payer: Self-pay | Admitting: Hematology and Oncology

## 2021-09-15 NOTE — Telephone Encounter (Signed)
Scheduled appointment per 01/17 staff message. Left message with appointment times.

## 2021-09-16 ENCOUNTER — Observation Stay (HOSPITAL_COMMUNITY)
Admission: RE | Admit: 2021-09-16 | Discharge: 2021-09-16 | Disposition: A | Discharge status: Home / Self Care | Payer: Medicare HMO | Source: Ambulatory Visit | Attending: Interventional Radiology | Admitting: Interventional Radiology

## 2021-09-16 ENCOUNTER — Other Ambulatory Visit: Payer: Self-pay

## 2021-09-16 ENCOUNTER — Encounter (HOSPITAL_COMMUNITY): Payer: Self-pay

## 2021-09-16 ENCOUNTER — Observation Stay (HOSPITAL_COMMUNITY)
Admission: EM | Admit: 2021-09-16 | Discharge: 2021-09-18 | Disposition: A | Discharge status: Home / Self Care | Payer: Medicare HMO | Attending: Family Medicine | Admitting: Family Medicine

## 2021-09-16 ENCOUNTER — Emergency Department (HOSPITAL_COMMUNITY): Payer: Medicare HMO

## 2021-09-16 DIAGNOSIS — R404 Transient alteration of awareness: Secondary | ICD-10-CM | POA: Diagnosis not present

## 2021-09-16 DIAGNOSIS — J90 Pleural effusion, not elsewhere classified: Secondary | ICD-10-CM | POA: Diagnosis not present

## 2021-09-16 DIAGNOSIS — E118 Type 2 diabetes mellitus with unspecified complications: Secondary | ICD-10-CM | POA: Diagnosis present

## 2021-09-16 DIAGNOSIS — Z743 Need for continuous supervision: Secondary | ICD-10-CM | POA: Diagnosis not present

## 2021-09-16 DIAGNOSIS — K7030 Alcoholic cirrhosis of liver without ascites: Secondary | ICD-10-CM | POA: Diagnosis not present

## 2021-09-16 DIAGNOSIS — R062 Wheezing: Secondary | ICD-10-CM | POA: Diagnosis not present

## 2021-09-16 DIAGNOSIS — D61818 Other pancytopenia: Secondary | ICD-10-CM | POA: Diagnosis present

## 2021-09-16 DIAGNOSIS — K7460 Unspecified cirrhosis of liver: Secondary | ICD-10-CM | POA: Diagnosis present

## 2021-09-16 DIAGNOSIS — I864 Gastric varices: Secondary | ICD-10-CM | POA: Insufficient documentation

## 2021-09-16 DIAGNOSIS — I5032 Chronic diastolic (congestive) heart failure: Secondary | ICD-10-CM | POA: Insufficient documentation

## 2021-09-16 DIAGNOSIS — T40711A Poisoning by cannabis, accidental (unintentional), initial encounter: Secondary | ICD-10-CM | POA: Diagnosis not present

## 2021-09-16 DIAGNOSIS — K7682 Hepatic encephalopathy: Secondary | ICD-10-CM | POA: Diagnosis not present

## 2021-09-16 DIAGNOSIS — I110 Hypertensive heart disease with heart failure: Secondary | ICD-10-CM | POA: Diagnosis not present

## 2021-09-16 DIAGNOSIS — K279 Peptic ulcer, site unspecified, unspecified as acute or chronic, without hemorrhage or perforation: Secondary | ICD-10-CM | POA: Diagnosis not present

## 2021-09-16 DIAGNOSIS — Z8505 Personal history of malignant neoplasm of liver: Secondary | ICD-10-CM | POA: Insufficient documentation

## 2021-09-16 DIAGNOSIS — Z79899 Other long term (current) drug therapy: Secondary | ICD-10-CM | POA: Insufficient documentation

## 2021-09-16 DIAGNOSIS — G929 Unspecified toxic encephalopathy: Secondary | ICD-10-CM | POA: Diagnosis present

## 2021-09-16 DIAGNOSIS — Z20822 Contact with and (suspected) exposure to covid-19: Secondary | ICD-10-CM | POA: Diagnosis not present

## 2021-09-16 DIAGNOSIS — R400 Somnolence: Secondary | ICD-10-CM | POA: Diagnosis not present

## 2021-09-16 DIAGNOSIS — R69 Illness, unspecified: Secondary | ICD-10-CM | POA: Diagnosis not present

## 2021-09-16 DIAGNOSIS — R4182 Altered mental status, unspecified: Secondary | ICD-10-CM | POA: Diagnosis not present

## 2021-09-16 DIAGNOSIS — E119 Type 2 diabetes mellitus without complications: Secondary | ICD-10-CM | POA: Insufficient documentation

## 2021-09-16 DIAGNOSIS — R4020 Unspecified coma: Secondary | ICD-10-CM | POA: Diagnosis not present

## 2021-09-16 DIAGNOSIS — I10 Essential (primary) hypertension: Secondary | ICD-10-CM | POA: Diagnosis not present

## 2021-09-16 DIAGNOSIS — J45909 Unspecified asthma, uncomplicated: Secondary | ICD-10-CM | POA: Insufficient documentation

## 2021-09-16 DIAGNOSIS — N200 Calculus of kidney: Secondary | ICD-10-CM | POA: Diagnosis not present

## 2021-09-16 DIAGNOSIS — Z87891 Personal history of nicotine dependence: Secondary | ICD-10-CM | POA: Diagnosis not present

## 2021-09-16 LAB — RESP PANEL BY RT-PCR (FLU A&B, COVID) ARPGX2
Influenza A by PCR: NEGATIVE
Influenza B by PCR: NEGATIVE
SARS Coronavirus 2 by RT PCR: NEGATIVE

## 2021-09-16 LAB — RAPID URINE DRUG SCREEN, HOSP PERFORMED
Amphetamines: NOT DETECTED
Barbiturates: NOT DETECTED
Benzodiazepines: NOT DETECTED
Cocaine: NOT DETECTED
Opiates: NOT DETECTED
Tetrahydrocannabinol: POSITIVE — AB

## 2021-09-16 LAB — URINALYSIS, ROUTINE W REFLEX MICROSCOPIC
Bilirubin Urine: NEGATIVE
Glucose, UA: NEGATIVE mg/dL
Ketones, ur: NEGATIVE mg/dL
Leukocytes,Ua: NEGATIVE
Nitrite: NEGATIVE
Protein, ur: NEGATIVE mg/dL
Specific Gravity, Urine: 1.013 (ref 1.005–1.030)
pH: 5 (ref 5.0–8.0)

## 2021-09-16 LAB — I-STAT CHEM 8, ED
BUN: 13 mg/dL (ref 8–23)
Calcium, Ion: 1.15 mmol/L (ref 1.15–1.40)
Chloride: 101 mmol/L (ref 98–111)
Creatinine, Ser: 1 mg/dL (ref 0.61–1.24)
Glucose, Bld: 110 mg/dL — ABNORMAL HIGH (ref 70–99)
HCT: 29 % — ABNORMAL LOW (ref 39.0–52.0)
Hemoglobin: 9.9 g/dL — ABNORMAL LOW (ref 13.0–17.0)
Potassium: 4.4 mmol/L (ref 3.5–5.1)
Sodium: 134 mmol/L — ABNORMAL LOW (ref 135–145)
TCO2: 24 mmol/L (ref 22–32)

## 2021-09-16 LAB — COMPREHENSIVE METABOLIC PANEL
ALT: 26 U/L (ref 0–44)
AST: 43 U/L — ABNORMAL HIGH (ref 15–41)
Albumin: 2.9 g/dL — ABNORMAL LOW (ref 3.5–5.0)
Alkaline Phosphatase: 156 U/L — ABNORMAL HIGH (ref 38–126)
Anion gap: 7 (ref 5–15)
BUN: 15 mg/dL (ref 8–23)
CO2: 23 mmol/L (ref 22–32)
Calcium: 8.4 mg/dL — ABNORMAL LOW (ref 8.9–10.3)
Chloride: 101 mmol/L (ref 98–111)
Creatinine, Ser: 0.97 mg/dL (ref 0.61–1.24)
GFR, Estimated: >60 mL/min (ref >60)
Glucose, Bld: 112 mg/dL — ABNORMAL HIGH (ref 70–99)
Potassium: 4.1 mmol/L (ref 3.5–5.1)
Sodium: 131 mmol/L — ABNORMAL LOW (ref 135–145)
Total Bilirubin: 1.4 mg/dL — ABNORMAL HIGH (ref 0.3–1.2)
Total Protein: 6.2 g/dL — ABNORMAL LOW (ref 6.5–8.1)

## 2021-09-16 LAB — CBC
HCT: 28.5 % — ABNORMAL LOW (ref 39.0–52.0)
Hemoglobin: 9 g/dL — ABNORMAL LOW (ref 13.0–17.0)
MCH: 27.1 pg (ref 26.0–34.0)
MCHC: 31.6 g/dL (ref 30.0–36.0)
MCV: 85.8 fL (ref 80.0–100.0)
Platelets: 120 10*3/uL — ABNORMAL LOW (ref 150–400)
RBC: 3.32 MIL/uL — ABNORMAL LOW (ref 4.22–5.81)
RDW: 18.2 % — ABNORMAL HIGH (ref 11.5–15.5)
WBC: 3.8 10*3/uL — ABNORMAL LOW (ref 4.0–10.5)
nRBC: 0 % (ref 0.0–0.2)

## 2021-09-16 LAB — GLUCOSE, CAPILLARY: Glucose-Capillary: 79 mg/dL (ref 70–99)

## 2021-09-16 LAB — LACTIC ACID, PLASMA: Lactic Acid, Venous: 1.7 mmol/L (ref 0.5–1.9)

## 2021-09-16 LAB — AMMONIA: Ammonia: 44 umol/L — ABNORMAL HIGH (ref 9–35)

## 2021-09-16 MED ORDER — PANTOPRAZOLE SODIUM 40 MG PO TBEC
40.0000 mg | DELAYED_RELEASE_TABLET | Freq: Two times a day (BID) | ORAL | Status: DC
  Administered 2021-09-17 – 2021-09-18 (×3): 40 mg via ORAL
  Filled 2021-09-16 (×3): qty 1

## 2021-09-16 MED ORDER — LACTULOSE 10 GM/15ML PO SOLN
30.0000 g | Freq: Three times a day (TID) | ORAL | Status: DC
  Administered 2021-09-16 – 2021-09-18 (×6): 30 g via ORAL
  Filled 2021-09-16 (×6): qty 45

## 2021-09-16 MED ORDER — ONDANSETRON HCL 4 MG/2ML IJ SOLN: 4.0000 mg | Freq: Four times a day (QID) | INTRAMUSCULAR | Status: DC | PRN

## 2021-09-16 MED ORDER — FERROUS SULFATE 325 (65 FE) MG PO TABS
325.0000 mg | ORAL_TABLET | Freq: Every day | ORAL | Status: DC
  Administered 2021-09-17 – 2021-09-18 (×2): 325 mg via ORAL
  Filled 2021-09-16 (×2): qty 1

## 2021-09-16 MED ORDER — SODIUM CHLORIDE (PF) 0.9 % IJ SOLN
INTRAMUSCULAR | Status: AC
  Filled 2021-09-16: qty 50

## 2021-09-16 MED ORDER — DEXTROSE-NACL 5-0.45 % IV SOLN: INTRAVENOUS | Status: DC

## 2021-09-16 MED ORDER — RIFAXIMIN 550 MG PO TABS
550.0000 mg | ORAL_TABLET | Freq: Two times a day (BID) | ORAL | Status: DC
Start: 2021-09-16 — End: 2021-09-18
  Administered 2021-09-16 – 2021-09-18 (×4): 550 mg via ORAL
  Filled 2021-09-16 (×5): qty 1

## 2021-09-16 MED ORDER — LACTULOSE 10 GM/15ML PO SOLN
10.0000 g | Freq: Once | ORAL | Status: AC
  Administered 2021-09-16: 10 g via ORAL
  Filled 2021-09-16: qty 30

## 2021-09-16 MED ORDER — ACETAMINOPHEN 650 MG RE SUPP: 650.0000 mg | Freq: Two times a day (BID) | RECTAL | Status: DC | PRN

## 2021-09-16 MED ORDER — SODIUM CHLORIDE 0.9 % IV BOLUS (SEPSIS)
1000.0000 mL | Freq: Once | INTRAVENOUS | Status: AC
  Administered 2021-09-16: 1000 mL via INTRAVENOUS

## 2021-09-16 MED ORDER — ENOXAPARIN SODIUM 40 MG/0.4ML IJ SOSY
40.0000 mg | PREFILLED_SYRINGE | INTRAMUSCULAR | Status: DC
  Administered 2021-09-16 – 2021-09-17 (×2): 40 mg via SUBCUTANEOUS
  Filled 2021-09-16 (×2): qty 0.4

## 2021-09-16 MED ORDER — SODIUM CHLORIDE 0.9 % IV SOLN
1000.0000 mL | INTRAVENOUS | Status: DC
  Administered 2021-09-16: 1000 mL via INTRAVENOUS

## 2021-09-16 MED ORDER — ACETAMINOPHEN 325 MG PO TABS: 650.0000 mg | ORAL_TABLET | Freq: Two times a day (BID) | ORAL | Status: DC | PRN

## 2021-09-16 MED ORDER — FUROSEMIDE 40 MG PO TABS
40.0000 mg | ORAL_TABLET | Freq: Every day | ORAL | Status: DC
  Administered 2021-09-16 – 2021-09-18 (×3): 40 mg via ORAL
  Filled 2021-09-16 (×3): qty 1

## 2021-09-16 MED ORDER — SPIRONOLACTONE 25 MG PO TABS
50.0000 mg | ORAL_TABLET | Freq: Every day | ORAL | Status: DC
Start: 2021-09-16 — End: 2021-09-18
  Administered 2021-09-16 – 2021-09-18 (×3): 50 mg via ORAL
  Filled 2021-09-16 (×3): qty 2

## 2021-09-16 MED ORDER — IOHEXOL 350 MG/ML SOLN
100.0000 mL | Freq: Once | INTRAVENOUS | Status: AC | PRN
  Administered 2021-09-16: 100 mL via INTRAVENOUS

## 2021-09-16 MED ORDER — ONDANSETRON HCL 4 MG PO TABS: 4.0000 mg | ORAL_TABLET | Freq: Four times a day (QID) | ORAL | Status: DC | PRN

## 2021-09-16 NOTE — H&P (Signed)
History and Physical    TREVONTAE [name] [name]:[id number] DOB: 09/14/1945 DOA: 09/16/2021  PCP: [name], MD  Patient coming from: Home  Chief Complaint: Lethargy  HPI: [name] is a 76 y.o. male with medical history significant of hepatocellular carcinoma, hepatic cirrhosis, diabetes mellitus type 2 (well controlled), portal hypertension with gastric varices s/p TIPS. Patient presented secondary to altered mental status in setting of accidental ingestion of THC infused gummy candy. Patient unable to provide history secondary to lethargy. Per EDP, patient presented after he ingested multiple THC infused gummy candies accidentally; they belonged to his son. He has remained lethargic since presenting to the ED.  ED Course: Vitals: Temperature of 97.8 F, Pulse of 66, respirations of 12, BP of 129/54, SpO2 of 98% on room air Labs: WBC of 3,800, hemoglobin of 9 => 9.9, platelets of 120, ammonia of 44, sodium of 131 =>134, ALP of 156, albumin of 2.9, bilirubin of 1.4 Imaging: CT head unremarkable. CTA abdomen/pelvis unremarkable for acute process Medications/Course: 1L NS bolus, NS IV infusion at 125 ml/hr  Review of Systems: Review of Systems  Unable to perform ROS: Mental status change  Constitutional:  Negative for chills and fever.  Cardiovascular:  Negative for chest pain and palpitations.  Gastrointestinal:  Positive for nausea and vomiting. Negative for abdominal pain, constipation and diarrhea.   Past Medical History:  Diagnosis Date   Arthritis    "joints tighten up sometimes" (03/27/2104)   Carcinoma of liver, hepatocellular ([place]) 06/30/2020   Chronic lower back pain    Coronary artery disease involving native coronary artery of native heart without angina pectoris    Severe left main disease at catheterization July 2015  CABG x3 with a LIMA to the LAD, SVG to the OM, SVG to the PDA on 03/31/14. EF 60% by cath.    Diastolic heart failure (HCC)    GERD without  esophagitis 08/04/2010   Hepatic cirrhosis ([place])    a. Dx 01/2014 - CT a/p    History of blood transfusion    "related to bleeding ulcers"   History of concussion    1976--  NO RESIDUAL   History of GI bleed    a. UGIB 07/2012;  b. 01/2014 admission with GIB/FOB stool req 1U prbc's->EGD showed portal gastropathy, barrett's esoph, and chronic active h. pylori gastritis.   History of gout    2007 &  2008  LEFT LEG-- NO ISSUE SINCE   Hyperlipidemia    Iron deficiency anemia    Kidney stones    OA (osteoarthritis of spine)    LOWER BACK--  INTERMITTANT LEFT LEG NUMBNESS   OSA (obstructive sleep apnea)    PULMOLOGIST-  DR CLANCE--  MODERATE OSA  STARTED CPAP 2012--  BUT CURRENTLY HAS NOT USED PAST 6 MONTHS   Phimosis    a. s/p circumcision 2015.   Type 2 diabetes mellitus ([place])    Unspecified essential hypertension     Past Surgical History:  Procedure Laterality Date   ANKLE FRACTURE SURGERY Right 1989   "plate put in"   APPENDECTOMY  05-16-2004   open   BIOPSY  01/08/2021   Procedure: BIOPSY;  Surgeon: Wilford Corner, MD;  Location: [facility];  Service: Endoscopy;;   BIOPSY  05/02/2021   Procedure: BIOPSY;  Surgeon: Arta Silence, MD;  Location: [facility];  Service: Endoscopy;;   CIRCUMCISION N/A 09/09/2013   Procedure: CIRCUMCISION ADULT;  Surgeon: Bernestine Amass, MD;  Location: [facility];  Service:  Urology;  Laterality: N/A;   COLECTOMY  05-16-2004   COLONOSCOPY N/A 05/02/2021   Procedure: COLONOSCOPY;  Surgeon: Arta Silence, MD;  [facility];  Service: Endoscopy;  Laterality: N/A;   COLONOSCOPY WITH PROPOFOL N/A 11/20/2020   Procedure: COLONOSCOPY WITH PROPOFOL;  Surgeon: Ronald Lobo, MD;  [facility];  Service: Endoscopy;  Laterality: N/A;   CORONARY ANGIOPLASTY WITH STENT PLACEMENT  06/28/2012  DR COOPER   PCI W/  X1 DES to [place]. LAD/  LM  40% OSTIAL & 50-60% DISTAL /  50% PROX LCX/  30-40% PROX RCA & 50% MID RCA/   LVEF  65-70%   CORONARY ARTERY BYPASS GRAFT N/A 03/31/2014   Procedure: CORONARY ARTERY BYPASS GRAFTING (CABG) times 3 using left internal mammary artery and right saphenous vein.;  Surgeon: Melrose Nakayama, MD;  Location: [place];  Service: Open Heart Surgery;  Laterality: N/A;   DOBUTAMINE STRESS ECHO  06-08-2012   MODERATE HYPOKINESIS/ ISCHEMIA MID INFERIOR WALL   ESOPHAGOGASTRODUODENOSCOPY  08/15/2012   Procedure: ESOPHAGOGASTRODUODENOSCOPY (EGD);  Surgeon: Wonda Horner, MD;  Location: [facility] ENDOSCOPY;  Service: Endoscopy;  Laterality: N/A;   ESOPHAGOGASTRODUODENOSCOPY N/A 02/17/2014   Procedure: ESOPHAGOGASTRODUODENOSCOPY (EGD);  Surgeon: Jeryl Columbia, MD;  Location: [facility] ENDOSCOPY;  Service: Endoscopy;  Laterality: N/A;   ESOPHAGOGASTRODUODENOSCOPY N/A 04/17/2020   Procedure: ESOPHAGOGASTRODUODENOSCOPY (EGD);  Surgeon: Ronnette Juniper, MD;  Location: [place];  Service: Gastroenterology;  Laterality: N/A;   ESOPHAGOGASTRODUODENOSCOPY N/A 09/11/2020   Procedure: ESOPHAGOGASTRODUODENOSCOPY (EGD);  Surgeon: Clarene Essex, MD;  Location: [facility];  Service: Endoscopy;  Laterality: N/A;   ESOPHAGOGASTRODUODENOSCOPY  10/09/2020   ESOPHAGOGASTRODUODENOSCOPY N/A 10/09/2020   Procedure: ESOPHAGOGASTRODUODENOSCOPY (EGD);  Surgeon: Otis Brace, MD;  Location: [facility] ENDOSCOPY;  Service: Gastroenterology;  Laterality: N/A;   ESOPHAGOGASTRODUODENOSCOPY N/A 01/08/2021   Procedure: ESOPHAGOGASTRODUODENOSCOPY (EGD);  Surgeon: Wilford Corner, MD;  Location: [facility];  Service: Endoscopy;  Laterality: N/A;   ESOPHAGOGASTRODUODENOSCOPY N/A 05/02/2021   Procedure: ESOPHAGOGASTRODUODENOSCOPY (EGD);  Surgeon: Arta Silence, MD;  Location: [facility];  Service: Endoscopy;  Laterality: N/A;   ESOPHAGOGASTRODUODENOSCOPY (EGD) WITH PROPOFOL N/A 09/30/2017   Procedure: ESOPHAGOGASTRODUODENOSCOPY (EGD) WITH PROPOFOL;  Surgeon: Wonda Horner, MD;  Location: [facility] ENDOSCOPY;  Service: Endoscopy;  Laterality: N/A;    ESOPHAGOGASTRODUODENOSCOPY (EGD) WITH PROPOFOL N/A 12/20/2017   Procedure: ESOPHAGOGASTRODUODENOSCOPY (EGD) WITH PROPOFOL;  Surgeon: Clarene Essex, MD;  Location: [place];  Service: Endoscopy;  Laterality: N/A;   ESOPHAGOGASTRODUODENOSCOPY (EGD) WITH PROPOFOL N/A 08/10/2020   Procedure: ESOPHAGOGASTRODUODENOSCOPY (EGD) WITH PROPOFOL;  Surgeon: Wilford Corner, MD;  Location: [place];  Service: Endoscopy;  Laterality: N/A;   ESOPHAGOGASTRODUODENOSCOPY (EGD) WITH PROPOFOL N/A 11/20/2020   Procedure: ESOPHAGOGASTRODUODENOSCOPY (EGD) WITH PROPOFOL;  Surgeon: Ronald Lobo, MD;  [facility];  Service: Endoscopy;  Laterality: N/A;   ESOPHAGOGASTRODUODENOSCOPY (EGD) WITH PROPOFOL N/A 02/28/2021   Procedure: ESOPHAGOGASTRODUODENOSCOPY (EGD) WITH PROPOFOL;  Surgeon: Clarene Essex, MD;  [facility];  Service: Endoscopy;  Laterality: N/A;   GIVENS CAPSULE STUDY N/A 11/20/2020   Procedure: GIVENS CAPSULE STUDY;  Surgeon: Ronald Lobo, MD;  [facility];  Service: Endoscopy;  Laterality: N/A;   HEMOSTASIS CLIP PLACEMENT  05/02/2021   Procedure: HEMOSTASIS CLIP PLACEMENT;  Surgeon: Arta Silence, MD;  [facility];  Service: Endoscopy;;   HERNIA REPAIR     INTRAOPERATIVE TRANSESOPHAGEAL ECHOCARDIOGRAM N/A 03/31/2014   Procedure: INTRAOPERATIVE TRANSESOPHAGEAL ECHOCARDIOGRAM;  Surgeon: Melrose Nakayama, MD;  Location: [place];  Service: Open Heart Surgery;  Laterality: N/A;   IR ANGIOGRAM SELECTIVE EACH ADDITIONAL  VESSEL  06/16/2020   IR ANGIOGRAM SELECTIVE EACH ADDITIONAL VESSEL  06/16/2020   IR ANGIOGRAM SELECTIVE EACH ADDITIONAL VESSEL  06/16/2020   IR ANGIOGRAM SELECTIVE EACH ADDITIONAL VESSEL  06/16/2020   IR ANGIOGRAM SELECTIVE EACH ADDITIONAL VESSEL  06/16/2020   IR ANGIOGRAM SELECTIVE EACH ADDITIONAL VESSEL  06/16/2020   IR ANGIOGRAM SELECTIVE EACH ADDITIONAL VESSEL  06/16/2020   IR ANGIOGRAM SELECTIVE EACH ADDITIONAL VESSEL  06/16/2020   IR ANGIOGRAM  SELECTIVE EACH ADDITIONAL VESSEL  06/16/2020   IR ANGIOGRAM SELECTIVE EACH ADDITIONAL VESSEL  06/30/2020   IR ANGIOGRAM SELECTIVE EACH ADDITIONAL VESSEL  06/30/2020   IR ANGIOGRAM VISCERAL SELECTIVE  06/16/2020   IR ANGIOGRAM VISCERAL SELECTIVE  06/30/2020   IR EMBO ARTERIAL NOT HEMORR HEMANG INC GUIDE ROADMAPPING  06/16/2020   IR EMBO TUMOR ORGAN ISCHEMIA INFARCT INC GUIDE ROADMAPPING  06/30/2020   IR EMBO VENOUS NOT HEMORR HEMANG  INC GUIDE ROADMAPPING  01/13/2021   IR IVUS EACH ADDITIONAL NON CORONARY VESSEL  01/13/2021   IR RADIOLOGIST EVAL & MGMT  05/06/2020   IR RADIOLOGIST EVAL & MGMT  05/20/2020   IR RADIOLOGIST EVAL & MGMT  05/25/2021   IR TIPS  01/13/2021   IR US GUIDE VASC ACCESS RIGHT  06/16/2020   IR US GUIDE VASC ACCESS RIGHT  06/30/2020   IR US GUIDE VASC ACCESS RIGHT  01/13/2021   LAPAROSCOPIC UMBILICAL HERNIA REPAIR W/ MESH  06-06-2011   LEFT HEART CATHETERIZATION WITH CORONARY ANGIOGRAM N/A 03/28/2014   Procedure: LEFT HEART CATHETERIZATION WITH CORONARY ANGIOGRAM;  Surgeon: Sinclair Grooms, MD;  Location: [facility] CATH LAB;  Service: Cardiovascular;  Laterality: N/A;   LIPOMA EXCISION Left 08/25/2017   Procedure: EXCISION LIPOMA LEFT POSTERIOR THIGH;  Surgeon: Clovis Riley, MD;  Location: [place];  Service: General;  Laterality: Left;   NEPHROLITHOTOMY  1990'S   OPEN APPENDECTOMY W/ PARTIAL CECECTOMY  05-16-2004   PERCUTANEOUS CORONARY STENT INTERVENTION (PCI-S) N/A 06/28/2012   Procedure: PERCUTANEOUS CORONARY STENT INTERVENTION (PCI-S);  Surgeon: Sherren Mocha, MD;  Location: [facility] CATH LAB;  Service: Cardiovascular;  Laterality: N/A;   POLYPECTOMY  11/20/2020   Procedure: POLYPECTOMY;  Surgeon: Ronald Lobo, MD;  [facility];  Service: Endoscopy;;   POLYPECTOMY  05/02/2021   Procedure: POLYPECTOMY;  Surgeon: Arta Silence, MD;  [facility];  Service: Endoscopy;;   RADIOLOGY WITH ANESTHESIA N/A 01/13/2021   Procedure: IR WITH ANESTHESIA - TIPS;  Surgeon:  Suzette Battiest, MD;  Location: [place];  Service: Radiology;  Laterality: N/A;     reports that he quit smoking about 25 years ago. His smoking use included cigarettes. He has a 80.00 pack-year smoking history. He has never used smokeless tobacco. He reports that he does not currently use alcohol after a past usage of about 8.0 standard drinks per week. He reports that he does not use drugs.  Allergies  Allergen Reactions   Fluzone Quadrivalent [Influenza Vac Split Quad] Other (See Comments)    The patient stated, in 10/2020: "I am not taking any more flu shots. It liked to have killed me."    Family History  Problem Relation Age of Onset   Lung cancer Sister    Cancer Sister        lung   Cancer Mother    Cancer Father        died in his 80s.   Cancer Brother        lung   Coronary artery disease Other    Diabetes  Other    Colon cancer Other    Cancer Sister        lung   Prior to Admission medications   Medication Sig Start Date End Date Taking? Authorizing Provider  ferrous sulfate 325 (65 FE) MG tablet Take 1 tablet (325 mg total) by mouth daily with breakfast. 05/06/21 09/16/22 Yes Dahal, Marlowe Aschoff, MD  furosemide (LASIX) 40 MG tablet Take 1 tablet (40 mg total) by mouth daily. 07/25/21 10/23/21 Yes Mercy Riding, MD  lactulose (CHRONULAC) 10 GM/15ML solution Take 45 mLs (30 g total) by mouth 3 (three) times daily. 09/07/21  Yes [name], MD  pantoprazole (PROTONIX) 40 MG tablet Take 1 tablet (40 mg total) by mouth 2 (two) times daily before a meal. 06/18/21  Yes Ghimire, Henreitta Leber, MD  rifaximin (XIFAXAN) 550 MG TABS tablet Take 1 tablet (550 mg total) by mouth 2 (two) times daily. 09/12/21  Yes Aline August, MD  spironolactone (ALDACTONE) 50 MG tablet Take 1 tablet (50 mg total) by mouth daily. 07/25/21  Yes Mercy Riding, MD  Accu-Chek Softclix Lancets lancets USE AS DIRECTED TO TEST BLOOD SUGAR FOUR TIMES DAILY 03/13/20   [name], MD  albuterol (VENTOLIN  HFA) 108 (90 Base) MCG/ACT inhaler Inhale 2 puffs into the lungs every 6 (six) hours as needed for wheezing or shortness of breath. Patient not taking: Reported on 09/16/2021 03/01/21   Aline August, MD  [facility] ELLIPTA 62.5-25 MCG/ACT AEPB Inhale 1 puff into the lungs daily. Patient not taking: Reported on 09/16/2021 07/25/21   Mercy Riding, MD  blood glucose meter kit and supplies Dispense based on patient and insurance preference. Use up to four times daily as directed. (FOR ICD-10 E10.9, E11.9). 03/12/20   [name], MD    Physical Exam:  Physical Exam Constitutional:      General: He is not in acute distress.    Appearance: He is not diaphoretic.  Eyes:     Conjunctiva/sclera: Conjunctivae normal.     Pupils: Pupils are equal, round, and reactive to light.  Cardiovascular:     Rate and Rhythm: Normal rate and regular rhythm.     Heart sounds: Murmur heard.  Systolic murmur is present with a grade of 2/6.  Pulmonary:     Effort: Pulmonary effort is normal. No respiratory distress.     Breath sounds: Normal breath sounds. No wheezing or rales.  Abdominal:     General: Bowel sounds are normal. There is abdominal bruit. There is no distension.     Palpations: Abdomen is soft.     Tenderness: There is no abdominal tenderness. There is no guarding or rebound.  Musculoskeletal:        General: No tenderness. Normal range of motion.     Cervical back: Normal range of motion.     Right lower leg: Edema present.     Left lower leg: Edema present.  Lymphadenopathy:     Cervical: No cervical adenopathy.  Skin:    General: Skin is warm and dry.  Neurological:     Mental Status: He is oriented to person, place, and time. He is lethargic.     Motor: Weakness (generalized) present. No tremor.    Labs on Admission: I have personally reviewed following labs and imaging studies  CBC: Recent Labs  Lab 09/10/21 1654 09/11/21 0929 09/12/21 0822 09/16/21 0055 09/16/21 0105   WBC 4.3 3.6* 3.4* 3.8*  --   NEUTROABS 2.9 2.3 2.2  --   --  HGB 7.6* 8.9* 7.4* 9.0* 9.9*  HCT 24.1* 28.5* 24.3* 28.5* 29.0*  MCV 82.3 85.1 85.3 85.8  --   PLT PLATELET CLUMPS NOTED ON SMEAR, UNABLE TO ESTIMATE PLATELET CLUMPS NOTED ON SMEAR, UNABLE TO ESTIMATE 93* 120*  --     Basic Metabolic Panel: Recent Labs  Lab 09/11/21 0315 09/12/21 0822 09/16/21 0055 09/16/21 0105  NA 136 139 131* 134*  K 3.6 3.9 4.1 4.4  CL 106 110 101 101  CO2 20* 21* 23  --   GLUCOSE 97 91 112* 110*  BUN '15 13 15 13  ' CREATININE 1.18 1.07 0.97 1.00  CALCIUM 8.2* 7.9* 8.4*  --   MG  --  1.9  --   --     GFR: Estimated Creatinine Clearance: 72.1 mL/min (by C-G formula based on SCr of 1 mg/dL).  Liver Function Tests: Recent Labs  Lab 09/10/21 0431 09/11/21 0315 09/12/21 0822 09/16/21 0055  AST 35 31 29 43*  ALT '22 21 20 26  ' ALKPHOS 117 115 120 156*  BILITOT 1.3* 1.3* 1.1 1.4*  PROT 5.2* 5.5* 5.1* 6.2*  ALBUMIN 2.2* 2.5* 2.2* 2.9*   No results for input(s): LIPASE, AMYLASE in the last 168 hours. Recent Labs  Lab 09/10/21 0602 09/11/21 0315 09/16/21 0055  AMMONIA 134* 82* 44*    Urine analysis:    Component Value Date/Time   COLORURINE YELLOW 09/16/2021 [place] 09/16/2021 0642   LABSPEC 1.013 09/16/2021 0642   PHURINE 5.0 09/16/2021 0642   GLUCOSEU NEGATIVE 09/16/2021 0642   HGBUR SMALL (A) 09/16/2021 0642   BILIRUBINUR NEGATIVE 09/16/2021 0642   KETONESUR NEGATIVE 09/16/2021 0642   PROTEINUR NEGATIVE 09/16/2021 0642   UROBILINOGEN 0.2 03/29/2014 1546   NITRITE NEGATIVE 09/16/2021 0642   LEUKOCYTESUR NEGATIVE 09/16/2021 0642     Radiological Exams on Admission: CT Head Wo Contrast  Result Date: 09/16/2021 CLINICAL DATA:  Altered mental status EXAM: CT HEAD WITHOUT CONTRAST TECHNIQUE: Contiguous axial images were obtained from the base of the skull through the vertex without intravenous contrast. RADIATION DOSE REDUCTION: This exam was performed  according to the departmental dose-optimization program which includes automated exposure control, adjustment of the mA and/or kV according to patient size and/or use of iterative reconstruction technique. COMPARISON:  CT head 07/23/2021 FINDINGS: Brain: There is no evidence of acute intracranial hemorrhage, extra-axial fluid collection, or acute infarct. Parenchymal volume is normal. The ventricles are normal in size. Patchy hypodensity throughout the subcortical and periventricular white matter likely reflects sequela of moderate chronic white matter microangiopathy. There is no mass lesion.  There is no midline shift. Vascular: There is calcification of the bilateral cavernous ICAs. Skull: Normal. Negative for fracture or focal lesion. Sinuses/Orbits: The paranasal sinuses are clear. The globes and orbits are unremarkable. Other: None. IMPRESSION: No acute intracranial pathology. Electronically Signed   By: Valetta Mole M.D.   On: 09/16/2021 08:11   CT Angio Abd/Pel w/ and/or w/o  Result Date: 09/16/2021 CLINICAL DATA:  76 year old male history of cirrhosis, hepatocellular carcinoma, gastrointestinal hemorrhage, and chronic anemia status post TIPS creation with gastroesophageal variceal coil embolization on 01/13/2021. EXAM: CT ANGIOGRAPHY ABDOMEN AND PELVIS WITH CONTRAST AND WITHOUT CONTRAST TECHNIQUE: Multidetector CT imaging of the abdomen and pelvis was performed using the standard protocol during bolus administration of intravenous contrast. Multiplanar reconstructed images and MIPs were obtained and reviewed to evaluate the vascular anatomy. CONTRAST:  120m OMNIPAQUE IOHEXOL 350 MG/ML SOLN COMPARISON:  09/08/2021, 01/29/2021, 01/13/2021, 01/08/2021 FINDINGS: VASCULAR Aorta:  Atherosclerosis without signficant stenosis, dissection, or aneurysm. Celiac: Obscured secondary to streak artifact. SMA: Patent without evidence of aneurysm, dissection, vasculitis or significant stenosis. Renals: Similar appearing  ostial occlusion versus high-grade stenosis of the single right renal artery secondary to atherosclerotic plaque. The artery is patent distally. Dual left renal arteries appear patent with mild ostial stenosis of the superior left renal artery. IMA: Patent without evidence of aneurysm, dissection, vasculitis or significant stenosis. Inflow: Patent without evidence of aneurysm, dissection, vasculitis or significant stenosis. Scattered atherosclerotic calcifications. Proximal Outflow: Bilateral common femoral and visualized portions of the superficial and profunda femoral arteries are patent without evidence of aneurysm, dissection, vasculitis or significant stenosis. Veins: The a patent veins are limited in visualization due to contrast bolus timing. Indwelling TIPS appears patent. The main portal, SMV common splenic vein appear patent. Status post coil embolization of 3 separate gastroesophageal varices arising from the splenic vein without definite evidence of recanalization. There are few mildly prominent distal esophageal varices present, decreased in size from pre-TIPS comparison. No evidence of splenorenal shunt. The renal veins are patent. No evidence of iliocaval thrombosis or anomaly. Review of the MIP images confirms the above findings. NON-VASCULAR Lower chest: Similar appearing rounded masslike consolidation about the sub pleural a posterior right lower lobe which measures up to proximally 4.1 x 3.6 cm in maximum axial dimensions. Similar appearing small right trace left pleural effusions. Similar appearing mild cardiomegaly. No pericardial effusion. Hepatobiliary: Similar appearing morphologic changes of cirrhosis including contour nodularity and right lobe atrophy. Similar appearing hypoattenuating subcapsular segment 8 mass measuring up to proximally 2.6 x 2.5 cm in maximum axial dimensions without arterial hyperenhancement or washout. No new hepatomas. The gallbladder is present unremarkable. No intra  or extrahepatic biliary ductal dilation. TIPS endograft in place, unchanged. Pancreas: Unremarkable. No pancreatic ductal dilatation or surrounding inflammatory changes. Spleen: Similar appearing scattered simple cysts and multifocal punctate calcified granuloma. No splenomegaly. Adrenals/Urinary Tract: Limited evaluation of the adrenal glands secondary to streak artifact. Persistent right renal atrophy. Unchanged calcified interpolar right renal calculus measuring up to 1.5 cm. No evidence of renal mass. No hydronephrosis. The bladder is mostly decompressed without significant wall thickening. Stomach/Bowel: Stomach is within normal limits. Appendix is not definitively identified. No evidence of bowel wall thickening, distention, or inflammatory changes. Lymphatic: No abdominopelvic lymphadenopathy. Reproductive: Prostate is unremarkable. Other: No ascites. Similar appearing tiny fat containing umbilical hernia. Musculoskeletal: Diffuse body wall anasarca. No acute or significant osseous findings. IMPRESSION: VASCULAR 1. Patent TIPS. A few small esophageal varices remain visible, decreased from pre-TIPS comparison. No evidence of definite recanalization of the previously embolized gastric varices. No evidence of development of ectopic varix. 2. Similar appearing ostial occlusion versus high-grade stenosis of the single right renal artery with associated right renal atrophy. 3.  Aortic Atherosclerosis (ICD10-I70.0). NON-VASCULAR 1. Similar appearing rounded masslike opacity in the right lower lobe which measures up to 4.1 cm and remains suspicious for primary bronchogenic neoplasm. 2. Similar appearing changes of cirrhosis with unchanged appearance of previously treated (radiation segmentectomy) right lobe hepatocellular carcinoma (LI-RADS TR-nonviable). No new hepatoma. 3. Similar appearing nonobstructive right nephrolithiasis. Ruthann Cancer, MD [facility] Radiology  Electronically Signed   By: Ruthann Cancer M.D.   On: 09/16/2021 11:34    EKG: Independently reviewed. NSR  Assessment/Plan  * Toxic encephalopathy- (present on admission) Secondary to accidental THC ingestion. Discussed case with poison control with recommendations for supportive care -Observation overnight -PT/OT eval  Pancytopenia ([place])- (present on admission)  Chronic and stable.  Peptic ulcer disease- (present on admission) -Continue Protonix  Hepatic cirrhosis ([place])- (present on admission) History of gastric varices. S/p tips. Secondary to alcohol in addition to history of hepatocellular carcinoma. Appears to be stable. -Continue lasix, spironolactone  Hepatic encephalopathy-resolved as of 06/15/2021, (present on admission) Chronic. Ammonia of 44 on admission. Unlikely contributing to current presentation. -Continue Xifaxan and lactulose    DVT prophylaxis: Lovenox Code Status: Full code Family Communication: None at bedside Disposition Plan: Medical floor. Discharge home likely in 24 hours Consults called: None Admission status: Observation   Cordelia Poche, MD [facility] 09/16/2021, 4:44 PM

## 2021-09-16 NOTE — Assessment & Plan Note (Addendum)
Continue Protonix °

## 2021-09-16 NOTE — ED Triage Notes (Signed)
Biba from home. Hx liver ca- recently admitted. Patient went to bed around 5pm. Wife attempted to arouse at 8pm and patient was not responding. Allegedly took a cbd gummy before bed

## 2021-09-16 NOTE — ED Notes (Signed)
Pt attached to cardiac monitor x3. VSS. Pt opens eyes and responds to voice, however falls back asleep. Pt oriented to person and place, however not oriented to current time.

## 2021-09-16 NOTE — ED Notes (Signed)
EDP at the bedside to speak with pt's son regarding pt's status.

## 2021-09-16 NOTE — Assessment & Plan Note (Signed)
Chronic and stable.   

## 2021-09-16 NOTE — ED Notes (Addendum)
This nurse spoke with pt's son, Lorrin Nawrot, [phone]. EDP has cleared pt for d/c. Per pt's son, son's ETA to pick pt up from ED around 1500 today.

## 2021-09-16 NOTE — ED Provider Notes (Signed)
[facility] DEPT Provider Note  CSN: [id number] Arrival date & time: 09/16/21 0015  Chief Complaint(s) Altered Mental Status  ED Triage Notes [name], RN (Registered Nurse)   Emergency Medicine   Date of Service: 09/16/2021 12:25 AM   Signed   Matt Holmes from home. Hx liver ca- recently admitted. Patient went to bed around 5pm. Wife attempted to arouse at 8pm and patient was not responding. Allegedly took a cbd gummy before bed     HPI [name] is a 76 y.o. male here for AMS and somnolence after eating CBG or TSH gummy just prior.  Prior to this, patient had been out and about    Altered Mental Status  Past Medical History Past Medical History:  Diagnosis Date   Arthritis    "joints tighten up sometimes" (03/27/2104)   Carcinoma of liver, hepatocellular ([place]) 06/30/2020   Chronic lower back pain    Coronary artery disease involving native coronary artery of native heart without angina pectoris    Severe left main disease at catheterization July 2015  CABG x3 with a LIMA to the LAD, SVG to the OM, SVG to the PDA on 03/31/14. EF 60% by cath.    Diastolic heart failure (HCC)    GERD without esophagitis 08/04/2010   Hepatic cirrhosis ([place])    a. Dx 01/2014 - CT a/p    History of blood transfusion    "related to bleeding ulcers"   History of concussion    1976--  NO RESIDUAL   History of GI bleed    a. UGIB 07/2012;  b. 01/2014 admission with GIB/FOB stool req 1U prbc's->EGD showed portal gastropathy, barrett's esoph, and chronic active h. pylori gastritis.   History of gout    2007 &  2008  LEFT LEG-- NO ISSUE SINCE   Hyperlipidemia    Iron deficiency anemia    Kidney stones    OA (osteoarthritis of spine)    LOWER BACK--  INTERMITTANT LEFT LEG NUMBNESS   OSA (obstructive sleep apnea)    PULMOLOGIST-  DR CLANCE--  MODERATE OSA  STARTED CPAP 2012--  BUT CURRENTLY HAS NOT USED PAST 6 MONTHS   Phimosis    a. s/p circumcision 2015.   Type 2  diabetes mellitus ([place])    Unspecified essential hypertension    Patient Active Problem List   Diagnosis Date Noted   Lactic acidosis 09/08/2021   GERD (gastroesophageal reflux disease) 09/08/2021   Chronic diastolic CHF (congestive heart failure) (HCC)    Generalized weakness    Hypokalemia 07/23/2021   Acute hepatic encephalopathy 06/15/2021   Acute upper GI bleed 04/29/2021   Cirrhosis ([place]) 04/29/2021   Acute on chronic anemia 04/28/2021   GI bleeding 09/10/2020   Hepatocellular carcinoma ([place]) 08/09/2020   Routine general medical examination at a health care facility 05/06/2020   Iron deficiency anemia due to chronic blood loss    Pancytopenia ([place]) 04/15/2020   Pulmonary hypertension, unspecified ([place]) 03/11/2020   Asthma, mild intermittent 62/26/3335   Alcoholic cirrhosis of liver with ascites ([place]) 12/27/2019   Acute on chronic diastolic CHF (congestive heart failure) ([place]) 11/01/2019   (HFpEF) heart failure with preserved ejection fraction ([place]) 12/24/2018   Acute on chronic anemia 12/18/2017   Peptic ulcer disease 12/18/2017   AKI (acute kidney injury) ([place]) 10/24/2017   GI bleed 09/29/2017   SOB (shortness of breath) 09/21/2017   Obesity (BMI 30.0-34.9) 02/05/2016   S/P CABG x 3 03/31/2014   Hepatic cirrhosis ([place])  02/27/2014   Melena 08/14/2012   Hyperlipidemia with target LDL less than 70 06/29/2012   Thrombocytopenia ([place]) 06/29/2012   Coronary artery disease involving native coronary artery of native heart without angina pectoris    OSA (obstructive sleep apnea)    Type 2 diabetes with complication ([place]) 92/42/6834   GERD without esophagitis 08/04/2010   History of gout    Hypertensive heart disease    Home Medication(s) Prior to Admission medications   Medication Sig Start Date End Date Taking? Authorizing Provider  ferrous sulfate 325 (65 FE) MG tablet Take 1 tablet (325 mg total) by mouth daily with breakfast. 05/06/21 09/16/22 Yes Dahal, Marlowe Aschoff, MD   furosemide (LASIX) 40 MG tablet Take 1 tablet (40 mg total) by mouth daily. 07/25/21 10/23/21 Yes Mercy Riding, MD  lactulose (CHRONULAC) 10 GM/15ML solution Take 45 mLs (30 g total) by mouth 3 (three) times daily. 09/07/21  Yes [name], MD  pantoprazole (PROTONIX) 40 MG tablet Take 1 tablet (40 mg total) by mouth 2 (two) times daily before a meal. 06/18/21  Yes Ghimire, Henreitta Leber, MD  rifaximin (XIFAXAN) 550 MG TABS tablet Take 1 tablet (550 mg total) by mouth 2 (two) times daily. 09/12/21  Yes Aline August, MD  spironolactone (ALDACTONE) 50 MG tablet Take 1 tablet (50 mg total) by mouth daily. 07/25/21  Yes Mercy Riding, MD  Accu-Chek Softclix Lancets lancets USE AS DIRECTED TO TEST BLOOD SUGAR FOUR TIMES DAILY 03/13/20   [name], MD  albuterol (VENTOLIN HFA) 108 (90 Base) MCG/ACT inhaler Inhale 2 puffs into the lungs every 6 (six) hours as needed for wheezing or shortness of breath. Patient not taking: Reported on 09/16/2021 03/01/21   Aline August, MD  [facility] ELLIPTA 62.5-25 MCG/ACT AEPB Inhale 1 puff into the lungs daily. Patient not taking: Reported on 09/16/2021 07/25/21   Mercy Riding, MD  blood glucose meter kit and supplies Dispense based on patient and insurance preference. Use up to four times daily as directed. (FOR ICD-10 E10.9, E11.9). 03/12/20   [name], MD                                                                                                                                    Allergies Fluzone quadrivalent [influenza vac split quad]  Review of Systems Review of Systems As noted in HPI  Physical Exam Vital Signs  I have reviewed the triage vital signs BP (!) 129/54 (BP Location: Left Arm)    Pulse 66    Temp 97.8 F (36.6 C) (Oral)    Resp 12    Ht '6\' 1"'  (1.854 m)    Wt 82.8 kg    SpO2 98%    BMI 24.08 kg/m   Physical Exam Vitals reviewed.  Constitutional:      General: He is not in acute distress.    Appearance: He is  well-developed. He is  not diaphoretic.  HENT:     Head: Normocephalic and atraumatic.     Nose: Nose normal.  Eyes:     General: No scleral icterus.       Right eye: No discharge.        Left eye: No discharge.     Conjunctiva/sclera: Conjunctivae normal.     Pupils: Pupils are equal, round, and reactive to light.  Cardiovascular:     Rate and Rhythm: Normal rate and regular rhythm.     Heart sounds: No murmur heard.   No friction rub. No gallop.  Pulmonary:     Effort: Pulmonary effort is normal. No respiratory distress.     Breath sounds: Normal breath sounds. No stridor. No rales.  Abdominal:     General: There is no distension.     Palpations: Abdomen is soft.     Tenderness: There is no abdominal tenderness.  Musculoskeletal:        General: No tenderness.     Cervical back: Normal range of motion and neck supple.  Skin:    General: Skin is warm and dry.     Coloration: Skin is jaundiced (mild with numerous spider hemangiomas on face and chest).     Findings: No erythema or rash.  Neurological:     Mental Status: He is lethargic and disoriented.     Comments: Intermittently follows commands moving all extremities    ED Results and Treatments Labs (all labs ordered are listed, but only abnormal results are displayed) Labs Reviewed  COMPREHENSIVE METABOLIC PANEL - Abnormal; Notable for the following components:      Result Value   Sodium 131 (*)    Glucose, Bld 112 (*)    Calcium 8.4 (*)    Total Protein 6.2 (*)    Albumin 2.9 (*)    AST 43 (*)    Alkaline Phosphatase 156 (*)    Total Bilirubin 1.4 (*)    All other components within normal limits  CBC - Abnormal; Notable for the following components:   WBC 3.8 (*)    RBC 3.32 (*)    Hemoglobin 9.0 (*)    HCT 28.5 (*)    RDW 18.2 (*)    Platelets 120 (*)    All other components within normal limits  AMMONIA - Abnormal; Notable for the following components:   Ammonia 44 (*)    All other components within  normal limits  URINALYSIS, ROUTINE W REFLEX MICROSCOPIC - Abnormal; Notable for the following components:   Hgb urine dipstick SMALL (*)    Bacteria, UA RARE (*)    All other components within normal limits  I-STAT CHEM 8, ED - Abnormal; Notable for the following components:   Sodium 134 (*)    Glucose, Bld 110 (*)    Hemoglobin 9.9 (*)    HCT 29.0 (*)    All other components within normal limits  LACTIC ACID, PLASMA  CBG MONITORING, ED  EKG  EKG Interpretation  Date/Time:  Thursday September 16 2021 05:17:34 EST Ventricular Rate:  68 PR Interval:  220 QRS Duration: 110 QT Interval:  467 QTC Calculation: 459 R Axis:   117 Text Interpretation: Sinus rhythm Supraventricular bigeminy Borderline prolonged PR interval Anteroseptal infarct, age indeterminate Confirmed by Addison Lank [phone]) on 09/16/2021 5:50:22 AM       Radiology No results found.  Pertinent labs & imaging results that were available during my care of the patient were reviewed by me and considered in my medical decision making (see MDM for details).  Medications Ordered in ED Medications  sodium chloride 0.9 % bolus 1,000 mL (0 mLs Intravenous Stopped 09/16/21 0354)    Followed by  0.9 %  sodium chloride infusion (1,000 mLs Intravenous New Bag/Given 09/16/21 0200)                                                                                                                                     Procedures Procedures  (including critical care time)  Medical Decision Making / ED Course        somnolence Given reported history, main concern would be for CBD/THC intoxication. Given h/o HCC and DM, will need to assess for evidence of Hep Enceph, DKA, infection, electrolyte derangement or renal failure.  Work-up ordered to assess concerns above. Labs Independently interpreted by me and noted  below: CBC without leukocytosis.  Anemia improved from prior. No significant electrolyte derangements or renal sufficiency. Bilirubin mildly elevated, not to the level where would affect mentation. Ammonia level at 44, much lower than prior levels making hepatic encephalopathy less likely. UA without evidence of infection.  Lactic acid negative.  Management: Patient provided with IV fluids. Will allow him to metabolize.  Reassessment: Patient's responsiveness and mentation is improving but still appears to be disoriented.        Assessment/Plan:                                                                                                                                              Altered mental status/somnolence Likely related to CBD/THC use Slowly metabolizing. No evidence of infection. Less likely hepatic encephalopathy. Given his slow improvement, CT head ordered to rule out ICH or metastatic disease (though this is less likely  given his negative CT in recent months) Patient care turned over to oncoming provider. Patient case and results discussed in detail; please see their note for further ED managment.    Final Clinical Impression(s) / ED Diagnoses Final diagnoses:  None           This chart was dictated using voice recognition software.  Despite best efforts to proofread,  errors can occur which can change the documentation meaning.    Fatima Blank, MD 09/16/21 [phone]

## 2021-09-16 NOTE — ED Notes (Signed)
Pt able to tolerate PO intake at this time however immediately falls back asleep.

## 2021-09-16 NOTE — ED Notes (Signed)
Pt to CT at this time.

## 2021-09-16 NOTE — ED Notes (Signed)
Spoke with [name], pt son, who states that the patient took one of his 150mg  CBD gummies about 45 minutes before the episode started

## 2021-09-16 NOTE — Assessment & Plan Note (Addendum)
Secondary to accidental THC ingestion. Discussed case with poison control with recommendations for supportive care. Resolved without specific treatment. PT/OT initially recommended SNF, but recommendations were updated once lethargy improved and are now recommending home with home health.

## 2021-09-16 NOTE — Assessment & Plan Note (Addendum)
History of gastric varices. S/p tips. Secondary to alcohol in addition to history of hepatocellular carcinoma. Appears to be stable. Continue lasix, spironolactone and bowel regimen.

## 2021-09-16 NOTE — Discharge Instructions (Addendum)
[name],  You were in the hospital because of confusion, likely from the gummy candy you ate. Your confusion took some time to improve but you are now back to baseline. Please follow-up your your primary care physician.

## 2021-09-16 NOTE — ED Provider Notes (Addendum)
Blood pressure (!) 117/52, pulse 74, temperature 98.2 F (36.8 C), temperature source Oral, resp. rate 12, height 6\' 1"  (1.854 m), weight 82.8 kg, SpO2 98 %.  Assuming care from Dr. Leonette Monarch.  In short, [name] is a 76 y.o. male with a chief complaint of Altered Mental Status .  Refer to the original H&P for additional details.  The current plan of care is to re-evaluate after CT head.  08:40 AM  Patient is responding to voice.  He remains drowsy but is following commands.  He is able to tell me he took a CBD gummy prior to going to bed.  Mental status seems to be improved from what was described in the notes previously.  Hemoglobin is slightly low but better than prior values from his recent admission. No AKI.   11:30 AM  Patient sitting more upright in bed.  He opens his eyes when I walk in the room and is able to engage in conversation.  He states he is feeling well.  Plan for p.o. challenge.  UDS positive for THC per his history. Plan for discharge if continues to improve.   12:55 PM  Spoke with the patient's son.  Patient is tolerating PO.  He tells me that he took CBD Gummies in his truck, thinking that they were candy.  He was somnolent through the night which ultimately prompted ED evaluation.  Described that his lab work is overall reassuring.  His mental status is clearing.  He is awake enough to take his medication.  Plan for discharge.   03:15 PM  Son is now at bedside. Patient continues to be drowsy and does not seem back to baseline to him. Has been in the ED now for nearly 15 hrs. Will give a lactulose dose and admit.   Discussed patient's case with [facility] to request admission. Patient and family (if present) updated with plan. Care transferred to [facility] service.      Margette Fast, MD 09/16/21 1257    Margette Fast, MD 09/16/21 1600

## 2021-09-16 NOTE — Assessment & Plan Note (Addendum)
Chronic. Ammonia of 44 on admission. Unlikely contributed to admission presentation. Continue Xifaxan and lactulose.

## 2021-09-16 NOTE — ED Notes (Signed)
Pt back from CT at this time 

## 2021-09-17 DIAGNOSIS — E118 Type 2 diabetes mellitus with unspecified complications: Secondary | ICD-10-CM | POA: Diagnosis not present

## 2021-09-17 DIAGNOSIS — K279 Peptic ulcer, site unspecified, unspecified as acute or chronic, without hemorrhage or perforation: Secondary | ICD-10-CM | POA: Diagnosis not present

## 2021-09-17 DIAGNOSIS — R69 Illness, unspecified: Secondary | ICD-10-CM | POA: Diagnosis not present

## 2021-09-17 DIAGNOSIS — G929 Unspecified toxic encephalopathy: Secondary | ICD-10-CM | POA: Diagnosis not present

## 2021-09-17 DIAGNOSIS — K7682 Hepatic encephalopathy: Secondary | ICD-10-CM | POA: Diagnosis not present

## 2021-09-17 DIAGNOSIS — D61818 Other pancytopenia: Secondary | ICD-10-CM | POA: Diagnosis not present

## 2021-09-17 NOTE — Evaluation (Signed)
Occupational Therapy Evaluation Patient Details Name: [name] MRN: [id number] DOB: 1946/02/25 Today's Date: 09/17/2021   History of Present Illness Pt is a 76 y/o male presenting to [facility] secondary to altered mental status in setting of accidental ingestion of THC infused gummy candy which belong to pt's son.  PMH includes recent hospitalization for GI bleed and weakness, liver cancer, hepatic cirrhosis, diabetes mellitus type 2  CHF, HTN, and CAD.   Clinical Impression   Patient is currently requiring assistance with ADLs including max to total assist with Lower body ADLs, minimal assist with seated Upper body ADLs,  as well as  minimal to moderate assist with bed mobility and minimal assist with functional transfers to toilet.  Current level of function is below patient's typical baseline.  During this evaluation, patient was limited by generalized weakness, impaired activity tolerance, neck pain, and cognitive deficits with lethargy, all of which has the potential to impact patient's safety and independence during functional mobility, as well as performance for ADLs.  Patient lives with his family, who are not present for Evaluation and pt remains an unreliable historian.  Patient demonstrates fair rehab potential, and should benefit from continued skilled occupational therapy services while in acute care to maximize safety, independence and quality of life at home.  Continued occupational therapy services in a SNF setting prior to return home is recommended.  ?    Recommendations for follow up therapy are one component of a multi-disciplinary discharge planning process, led by the attending physician.  Recommendations may be updated based on patient status, additional functional criteria and insurance authorization.   Follow Up Recommendations  Skilled nursing-short term rehab (<3 hours/day)    Assistance Recommended at Discharge Frequent or constant Supervision/Assistance  Patient can  return home with the following Two people to help with bathing/dressing/bathroom;A lot of help with bathing/dressing/bathroom;Assistance with cooking/housework;A lot of help with walking and/or transfers;Direct supervision/assist for medications management    Functional Status Assessment  Patient has had a recent decline in their functional status and demonstrates the ability to make significant improvements in function in a reasonable and predictable amount of time.  Equipment Recommendations   (TBD)    Recommendations for Other Services       Precautions / Restrictions Precautions Precautions: Fall Restrictions Weight Bearing Restrictions: No      Mobility Bed Mobility Overal bed mobility: Needs Assistance Bed Mobility: Supine to Sit     Supine to sit: Mod assist          Transfers Overall transfer level: Needs assistance Equipment used: Rolling walker (2 wheels) Transfers: Sit to/from Stand Sit to Stand: Min assist                  Balance Overall balance assessment: Needs assistance Sitting-balance support: No upper extremity supported, Feet supported Sitting balance-Leahy Scale: Fair     Standing balance support: During functional activity, Reliant on assistive device for balance Standing balance-Leahy Scale: Poor                             ADL either performed or assessed with clinical judgement   ADL Overall ADL's : Needs assistance/impaired Eating/Feeding: Minimal assistance;Sitting Eating/Feeding Details (indicate cue type and reason): close supervision as well due to lethargy Grooming: Min guard;Cueing for sequencing;Oral care;Wash/dry face;Sitting Grooming Details (indicate cue type and reason): In recliner, pt required cues to begin and cues to terminate activities.  Increased time for pt  to bring swab to mouth once placed in RT hand. Upper Body Bathing: Minimal assistance;Sitting   Lower Body Bathing: Maximal assistance;Sit  to/from stand;Sitting/lateral leans;Cueing for sequencing   Upper Body Dressing : Minimal assistance;Sitting;Cueing for sequencing   Lower Body Dressing: Total assistance Lower Body Dressing Details (indicate cue type and reason): Total assist to don slide on shoes. Pt reports assist from son with LB ADLs. Toilet Transfer: Stand-pivot;Rolling walker (2 wheels);Minimal assistance;Cueing for sequencing;Cueing for safety Toilet Transfer Details (indicate cue type and reason): To recliner. Toileting- Clothing Manipulation and Hygiene: Total assistance Toileting - Clothing Manipulation Details (indicate cue type and reason): Based on general assessment. Pt denied need to void.     Functional mobility during ADLs: Minimal assistance;Moderate assistance;Cueing for sequencing;Cueing for safety;Rolling walker (2 wheels)       Vision   Additional Comments: Decreased visual attention to people and tasks. Stares straight ahead much of time.     Perception     Praxis Praxis Praxis: Impaired Praxis Impairment Details: Perseveration;Motor planning    Pertinent Vitals/Pain Pain Assessment Pain Assessment: 0-10 Pain Score: 10-Worst pain ever Pain Location: neck Pain Descriptors / Indicators: Other (Comment) (Stiff) Pain Intervention(s): Limited activity within patient's tolerance, Monitored during session (Led pt in gentle ROM/neck rotation)     Hand Dominance Right   Extremity/Trunk Assessment Upper Extremity Assessment Upper Extremity Assessment: Generalized weakness   Lower Extremity Assessment Lower Extremity Assessment: Defer to PT evaluation   Cervical / Trunk Assessment Cervical / Trunk Assessment: Other exceptions Cervical / Trunk Exceptions: Cervical limitations with rotation and lateral flexion.   Communication     Cognition Arousal/Alertness: Awake/alert Behavior During Therapy: Flat affect Overall Cognitive Status: No family/caregiver present to determine baseline  cognitive functioning Area of Impairment: Problem solving, Awareness, Safety/judgement, Following commands, Memory, Attention                 Orientation Level: Disoriented to, Situation, Time (Stating reason for admission as last diagnosis of GI bleed) Current Attention Level: Sustained Memory: Decreased short-term memory Following Commands: Follows one step commands inconsistently, Follows one step commands with increased time Safety/Judgement: Decreased awareness of deficits, Decreased awareness of safety Awareness: Intellectual Problem Solving: Slow processing, Difficulty sequencing, Requires verbal cues, Requires tactile cues, Decreased initiation       General Comments       Exercises     Shoulder Instructions      Home Living Family/patient expects to be discharged to:: Private residence Living Arrangements: Spouse/significant other Available Help at Discharge: Family;Available PRN/intermittently Type of Home: House Home Access: Stairs to enter [employer] of Steps: 1 Entrance Stairs-Rails: Left Home Layout: Two level;Able to live on main level with bedroom/bathroom     Bathroom Shower/Tub: Teacher, early years/pre: Standard Bathroom Accessibility: Yes How Accessible: Accessible via walker Home Equipment: Rolling Walker (2 wheels);BSC/3in1;Cane - single point;Wheelchair - manual   Additional Comments: Information taken from previous OT Evaluation.  Pt remains lethargc and able to give some information but limited.  Pt confirms that he is living with his son.      Prior Functioning/Environment Prior Level of Function : Patient poor historian/Family not available             Mobility Comments: Use RW for mobility. Pt denied falls since home but reported "some stumbles" ADLs Comments: Pt reports that son assists but unable to give specofics.  Pt reports, "I've done a little laundry since I've been home". Unsure of reliability.  OT Problem List: Decreased strength;Cardiopulmonary status limiting activity;Decreased cognition;Increased edema;Decreased activity tolerance;Decreased safety awareness;Decreased knowledge of use of DME or AE;Impaired balance (sitting and/or standing);Decreased knowledge of precautions;Decreased coordination;Pain      OT Treatment/Interventions: Self-care/ADL training;Therapeutic exercise;Therapeutic activities;Cognitive remediation/compensation;Patient/family education;DME and/or AE instruction;Balance training    OT Goals(Current goals can be found in the care plan section) Acute Rehab OT Goals Patient Stated Goal: To go home OT Goal Formulation: With patient Time For Goal Achievement: 10/01/21 Potential to Achieve Goals: Fair ADL Goals Pt Will Perform Grooming: standing;with supervision Pt Will Transfer to Toilet: ambulating;with modified independence Pt Will Perform Toileting - Clothing Manipulation and hygiene: with modified independence;sit to/from stand;sitting/lateral leans Additional ADL Goal #1: Pt will demonstrate improved medication management knowledge by passing the Pillbox Test. Family will verbalize plan to keep pt safe from medications that are not his own. Additional ADL Goal #2: Pt will demonstrate improved mentation by scoring <4/10 on short blessed test and answering 4/4 safety questions from the [facility] correctly:   1. What do you do for yourself if you are sick with a cold.    2. What do you do if you burn yourself and the wound becomes infected.    3. What do you do if you experience severe chest pain and shortness of breath? 4. What number do you call in an emergency?  OT Frequency: Min 2X/week    Co-evaluation              AM-PAC OT "6 Clicks" Daily Activity     Outcome Measure Help from another person eating meals?: A Little Help from another person taking care of personal grooming?: A Little Help from another person toileting, which includes using toliet,  bedpan, or urinal?: A Lot Help from another person bathing (including washing, rinsing, drying)?: A Lot Help from another person to put on and taking off regular upper body clothing?: A Little Help from another person to put on and taking off regular lower body clothing?: Total 6 Click Score: 14   End of Session Equipment Utilized During Treatment: Gait belt;Rolling walker (2 wheels) Nurse Communication: Mobility status;Other (comment) (Coughed up large amount of mucus with need of assistance to clear mouth)  Activity Tolerance: Patient tolerated treatment well Patient left: in chair;with call bell/phone within reach;with chair alarm set  OT Visit Diagnosis: Unsteadiness on feet (R26.81);Other symptoms and signs involving cognitive function;Muscle weakness (generalized) (M62.81);Feeding difficulties (R63.3);Pain Pain - part of body:  (neck)                Time: 1505-6979 OT Time Calculation (min): 28 min Charges:  OT General Charges $OT Visit: 1 Visit OT Evaluation $OT Eval Low Complexity: 1 Low OT Treatments $Self Care/Home Management : 8-22 mins  Anderson Malta, OT Acute Rehab Services Office: [phone] 09/17/2021  Julien Girt 09/17/2021, 2:36 PM

## 2021-09-17 NOTE — Telephone Encounter (Signed)
Patient has been scheduled with Oncology on 09/21/21 at 3:45. PET scheduled for 10/04/21 and OV with Alva on 10/13/21. Nothing further needed at this time.

## 2021-09-17 NOTE — Evaluation (Signed)
Physical Therapy Evaluation Patient Details Name: [name] MRN: [id number] DOB: 05-Nov-1945 Today's Date: 09/17/2021  History of Present Illness  Pt is a 76 y/o male presenting to [facility] on 1/19 secondary to altered mental status in setting of accidental ingestion of THC infused gummy candy which belong to pt's son. Found to have toxic encephalopathy. PMH includes recent hospitalization for GI bleed and weakness, liver cancer, hepatic cirrhosis, diabetes mellitus type 2  CHF, HTN, and CAD.  Clinical Impression   Received in bed, fatigued and lethargic, quite confused as to the date/time and why he was in the hospital. Reoriented as appropriate, he did then participate in MMT and initiated getting to EOB with MinA, however once his legs were over the side he told me he is too tired and pulled his legs back into the bed. Unable to get him to EOB/OOB today. Note he was generally ModA for bed mobility and transfers with OT earlier. Left in bed with all needs met, bed alarm active. Would benefit from rehab in SNF setting prior to return home.        Recommendations for follow up therapy are one component of a multi-disciplinary discharge planning process, led by the attending physician.  Recommendations may be updated based on patient status, additional functional criteria and insurance authorization.  Follow Up Recommendations Skilled nursing-short term rehab (<3 hours/day)    Assistance Recommended at Discharge Frequent or constant Supervision/Assistance  Patient can return home with the following  A lot of help with walking and/or transfers;Direct supervision/assist for medications management;A lot of help with bathing/dressing/bathroom;Direct supervision/assist for financial management;Assistance with cooking/housework;Assist for transportation;Help with stairs or ramp for entrance    Equipment Recommendations Wheelchair (measurements PT);Wheelchair cushion (measurements PT);Hospital bed;Rolling  walker (2 wheels);BSC/3in1  Recommendations for Other Services       Functional Status Assessment Patient has had a recent decline in their functional status and demonstrates the ability to make significant improvements in function in a reasonable and predictable amount of time.     Precautions / Restrictions Precautions Precautions: Fall Restrictions Weight Bearing Restrictions: No      Mobility  Bed Mobility Overal bed mobility: Needs Assistance Bed Mobility: Supine to Sit     Supine to sit: Min assist     General bed mobility comments: needed MinA to bring BLEs off EOB, then told me he was too tired to continue and brougth BLEs back into bed- per OT, required [place] for bed mobilty earlier today    Transfers                   General transfer comment: deferred- fatigue/lethargy    Ambulation/Gait               General Gait Details: deferred- fatigue/lethargy  Stairs            Wheelchair Mobility    Modified Rankin (Stroke Patients Only)       Balance                                             Pertinent Vitals/Pain Pain Assessment Pain Assessment: Faces Pain Score: 0-No pain Faces Pain Scale: No hurt Pain Intervention(s): Limited activity within patient's tolerance, Monitored during session    Home Living Family/patient expects to be discharged to:: Private residence Living Arrangements: Spouse/significant other Available Help at Discharge: Family;Available PRN/intermittently Type  of Home: House Home Access: Stairs to enter Entrance Stairs-Rails: Left Entrance Stairs-Number of Steps: 1   Home Layout: Two level;Able to live on main level with bedroom/bathroom Home Equipment: Rolling Walker (2 wheels);BSC/3in1;Cane - single point;Wheelchair - manual Additional Comments: Information taken from previous charting.  Pt remains lethargc and able to give some information but limited.  Pt confirms that he is living with  his son.    Prior Function Prior Level of Function : Patient poor historian/Family not available             Mobility Comments: Use RW for mobility. Pt denied falls since home but reported "some stumbles" ADLs Comments: Pt reports that son assists but unable to give specofics.  Pt reports, "I've done a little laundry since I've been home". Unsure of reliability.     Hand Dominance   Dominant Hand: Right    Extremity/Trunk Assessment   Upper Extremity Assessment Upper Extremity Assessment: Defer to OT evaluation    Lower Extremity Assessment Lower Extremity Assessment: Generalized weakness    Cervical / Trunk Assessment Cervical / Trunk Assessment: Other exceptions Cervical / Trunk Exceptions: Cervical limitations with rotation and lateral flexion.  Communication      Cognition Arousal/Alertness: Lethargic Behavior During Therapy: Flat affect Overall Cognitive Status: No family/caregiver present to determine baseline cognitive functioning Area of Impairment: Problem solving, Awareness, Safety/judgement, Following commands, Memory, Attention                 Orientation Level: Disoriented to, Situation, Time Current Attention Level: Focused Memory: Decreased short-term memory Following Commands: Follows one step commands inconsistently, Follows one step commands with increased time Safety/Judgement: Decreased awareness of deficits, Decreased awareness of safety Awareness: Intellectual Problem Solving: Slow processing, Difficulty sequencing, Requires verbal cues, Requires tactile cues, Decreased initiation          General Comments General comments (skin integrity, edema, etc.): did not get to EOB to assess balance- fatigue/lethargy    Exercises     Assessment/Plan    PT Assessment Patient needs continued PT services  PT Problem List Decreased strength;Decreased activity tolerance;Decreased balance;Decreased mobility;Decreased knowledge of use of  DME;Decreased knowledge of precautions;Decreased safety awareness;Decreased cognition       PT Treatment Interventions DME instruction;Gait training;Therapeutic activities;Functional mobility training;Therapeutic exercise;Balance training;Patient/family education;Stair training;Neuromuscular re-education;Cognitive remediation;Wheelchair mobility training    PT Goals (Current goals can be found in the Care Plan section)  Acute Rehab PT Goals Patient Stated Goal: to go home PT Goal Formulation: With patient Time For Goal Achievement: 10/01/21 Potential to Achieve Goals: Fair    Frequency Min 3X/week     Co-evaluation               AM-PAC PT "6 Clicks" Mobility  Outcome Measure Help needed turning from your back to your side while in a flat bed without using bedrails?: A Little Help needed moving from lying on your back to sitting on the side of a flat bed without using bedrails?: A Lot Help needed moving to and from a bed to a chair (including a wheelchair)?: A Lot Help needed standing up from a chair using your arms (e.g., wheelchair or bedside chair)?: A Lot Help needed to walk in hospital room?: A Lot Help needed climbing 3-5 steps with a railing? : Total 6 Click Score: 12    End of Session   Activity Tolerance: Patient limited by fatigue;Patient limited by lethargy Patient left: in bed;with call bell/phone within reach;with bed alarm set   PT  Visit Diagnosis: Unsteadiness on feet (R26.81);Muscle weakness (generalized) (M62.81)    Time: 0600-4599 PT Time Calculation (min) (ACUTE ONLY): 10 min   Charges:   PT Evaluation $PT Eval Moderate Complexity: 1 Mod         Artesha Wemhoff U PT, DPT, PN2   Supplemental Physical Therapist [place]    Pager [phone] Acute Rehab Office [phone]

## 2021-09-17 NOTE — Progress Notes (Signed)
PT Cancellation Note  Patient Details Name: [name] MRN: [id number] DOB: May 21, 1946   Cancelled Treatment:    Reason Eval/Treat Not Completed: Patient declined, no reason specified Focused on eating lunch, wants me to come back later. Will attempt if time/schedule allow.   Windell Norfolk, DPT, PN2   Supplemental Physical Therapist [place]    Pager [phone] Acute Rehab Office [phone]

## 2021-09-17 NOTE — Hospital Course (Signed)
[name] is a 76 y.o. male with a history of hepatocellular carcinoma, hepatic cirrhosis, diabetes mellitus type 2 (well controlled), portal hypertension with gastric varices s/p TIPS. Patient presented secondary to altered mental status in setting of accidental ingestion of THC infused gummy candy.

## 2021-09-17 NOTE — Progress Notes (Signed)
PROGRESS NOTE    [name]  HBZ:[id number] DOB: 1945/09/15 DOA: 09/16/2021 PCP: [name], MD   Brief Narrative: [name] is a 76 y.o. male with a history of hepatocellular carcinoma, hepatic cirrhosis, diabetes mellitus type 2 (well controlled), portal hypertension with gastric varices s/p TIPS. Patient presented secondary to altered mental status in setting of accidental ingestion of THC infused gummy candy.   Assessment & Plan:   * Toxic encephalopathy- (present on admission) Secondary to accidental THC ingestion. Discussed case with poison control with recommendations for supportive care. Patient improved today but unsure of baseline. PT/OT recommending SNF.  Pancytopenia ([place])- (present on admission) Chronic and stable.  Peptic ulcer disease- (present on admission) -Continue Protonix  Hepatic cirrhosis ([place])- (present on admission) History of gastric varices. S/p tips. Secondary to alcohol in addition to history of hepatocellular carcinoma. Appears to be stable. -Continue lasix, spironolactone  Hepatic encephalopathy-resolved as of 06/15/2021, (present on admission) Chronic. Ammonia of 44 on admission. Unlikely contributing to current presentation. -Continue Xifaxan and lactulose     DVT prophylaxis: Lovenox Code Status:   Code Status: DNR Family Communication: Called son and spouse, but no answer Disposition Plan: Discharge to SNF when bed is available   Consultants:  None  Procedures:  None  Antimicrobials: None    Subjective: Patient reports no issues  Objective: Vitals:   09/16/21 2240 09/17/21 0236 09/17/21 0620 09/17/21 1422  BP: 126/63 (!) 136/50 (!) 120/54 (!) 112/52  Pulse: 64 64 68 62  Resp: 18 18 20 20   Temp: 98.1 F (36.7 C) 98 F (36.7 C) 97.6 F (36.4 C) 97.9 F (36.6 C)  TempSrc: Axillary  Oral Oral  SpO2: 100% 100% 98% 97%  Weight:      Height:        Intake/Output Summary (Last 24 hours) at 09/17/2021  1552 Last data filed at 09/17/2021 1400 Gross per 24 hour  Intake 740.65 ml  Output 950 ml  Net -209.35 ml   Filed Weights   09/16/21 0117  Weight: 82.8 kg    Examination:  General exam: Appears calm and comfortable  Respiratory system: Clear to auscultation. Respiratory effort normal. Cardiovascular system: S1 & S2 heard, RRR. No murmurs, rubs, gallops or clicks. Gastrointestinal system: Abdomen is mildly distended, soft and nontender. No organomegaly or masses felt. Normal bowel sounds heard. Central nervous system: Somnolent but arouses. Oriented to person and place.  Musculoskeletal:  No calf tenderness Skin: No cyanosis. No rashes     Data Reviewed: I have personally reviewed following labs and imaging studies  CBC Lab Results  Component Value Date   WBC 3.8 (L) 09/16/2021   RBC 3.32 (L) 09/16/2021   HGB 9.9 (L) 09/16/2021   HCT 29.0 (L) 09/16/2021   MCV 85.8 09/16/2021   MCH 27.1 09/16/2021   PLT 120 (L) 09/16/2021   MCHC 31.6 09/16/2021   RDW 18.2 (H) 09/16/2021   LYMPHSABS 0.6 (L) 09/12/2021   MONOABS 0.4 09/12/2021   EOSABS 0.2 09/12/2021   BASOSABS 0.0 06/14/5101     Last metabolic panel Lab Results  Component Value Date   NA 134 (L) 09/16/2021   K 4.4 09/16/2021   CL 101 09/16/2021   CO2 23 09/16/2021   BUN 13 09/16/2021   CREATININE 1.00 09/16/2021   GLUCOSE 110 (H) 09/16/2021   GFRNONAA >60 09/16/2021   GFRAA 59 (L) 08/25/2020   CALCIUM 8.4 (L) 09/16/2021   PHOS 2.8 07/25/2021   PROT 6.2 (L) 09/16/2021  ALBUMIN 2.9 (L) 09/16/2021   BILITOT 1.4 (H) 09/16/2021   ALKPHOS 156 (H) 09/16/2021   AST 43 (H) 09/16/2021   ALT 26 09/16/2021   ANIONGAP 7 09/16/2021    CBG (last 3)  Recent Labs    09/16/21 1726  GLUCAP 79     GFR: Estimated Creatinine Clearance: 72.1 mL/min (by C-G formula based on SCr of 1 mg/dL).  Coagulation Profile: No results for input(s): INR, PROTIME in the last 168 hours.  Recent Results (from the past 240  hour(s))  Resp Panel by RT-PCR (Flu A&B, Covid) Nasopharyngeal Swab     Status: None   Collection Time: 09/08/21  5:08 PM   Specimen: Nasopharyngeal Swab; Nasopharyngeal(NP) swabs in vial transport medium  Result Value Ref Range Status   SARS Coronavirus 2 by RT PCR NEGATIVE NEGATIVE Final    Comment: (NOTE) SARS-CoV-2 target nucleic acids are NOT DETECTED.  The SARS-CoV-2 RNA is generally detectable in upper respiratory specimens during the acute phase of infection. The lowest concentration of SARS-CoV-2 viral copies this assay can detect is 138 copies/mL. A negative result does not preclude SARS-Cov-2 infection and should not be used as the sole basis for treatment or other patient management decisions. A negative result may occur with  improper specimen collection/handling, submission of specimen other than nasopharyngeal swab, presence of viral mutation(s) within the areas targeted by this assay, and inadequate number of viral copies(<138 copies/mL). A negative result must be combined with clinical observations, patient history, and epidemiological information. The expected result is Negative.  Fact Sheet for Patients:  EntrepreneurPulse.com.au  Fact Sheet for Healthcare Providers:  IncredibleEmployment.be  This test is no t yet approved or cleared by the [place] FDA and  has been authorized for detection and/or diagnosis of SARS-CoV-2 by FDA under an Emergency Use Authorization (EUA). This EUA will remain  in effect (meaning this test can be used) for the duration of the COVID-19 declaration under Section 564(b)(1) of the Act, 21 U.S.C.section 360bbb-3(b)(1), unless the authorization is terminated  or revoked sooner.       Influenza A by PCR NEGATIVE NEGATIVE Final   Influenza B by PCR NEGATIVE NEGATIVE Final    Comment: (NOTE) The Xpert Xpress SARS-CoV-2/FLU/RSV plus assay is intended as an aid in the diagnosis of influenza from  Nasopharyngeal swab specimens and should not be used as a sole basis for treatment. Nasal washings and aspirates are unacceptable for Xpert Xpress SARS-CoV-2/FLU/RSV testing.  Fact Sheet for Patients: EntrepreneurPulse.com.au  Fact Sheet for Healthcare Providers: IncredibleEmployment.be  This test is not yet approved or cleared by the [place] FDA and has been authorized for detection and/or diagnosis of SARS-CoV-2 by FDA under an Emergency Use Authorization (EUA). This EUA will remain in effect (meaning this test can be used) for the duration of the COVID-19 declaration under Section 564(b)(1) of the Act, 21 U.S.C. section 360bbb-3(b)(1), unless the authorization is terminated or revoked.  Performed at [facility], [place] [address], [place], [place] [address]   Body fluid culture w Gram Stain     Status: None   Collection Time: 09/11/21  1:53 PM   Specimen: Pleural Fluid  Result Value Ref Range Status   Specimen Description PLEURAL FLUID  Final   Special Requests NONE  Final   Gram Stain CYTOSPIN SMEAR FEW WBC SEEN NO ORGANISMS SEEN   Final   Culture   Final    NO GROWTH 3 DAYS Performed at [facility], [place]  [address], [place], [place] [address]    Report Status 09/14/2021 FINAL  Final  Resp Panel by RT-PCR (Flu A&B, Covid) Nasopharyngeal Swab     Status: None   Collection Time: 09/16/21  5:35 PM   Specimen: Nasopharyngeal Swab; Nasopharyngeal(NP) swabs in vial transport medium  Result Value Ref Range Status   SARS Coronavirus 2 by RT PCR NEGATIVE NEGATIVE Final    Comment: (NOTE) SARS-CoV-2 target nucleic acids are NOT DETECTED.  The SARS-CoV-2 RNA is generally detectable in upper respiratory specimens during the acute phase of infection. The lowest concentration of SARS-CoV-2 viral copies this assay can detect is 138 copies/mL. A negative result does not preclude SARS-Cov-2 infection and should not be used as the  sole basis for treatment or other patient management decisions. A negative result may occur with  improper specimen collection/handling, submission of specimen other than nasopharyngeal swab, presence of viral mutation(s) within the areas targeted by this assay, and inadequate number of viral copies(<138 copies/mL). A negative result must be combined with clinical observations, patient history, and epidemiological information. The expected result is Negative.  Fact Sheet for Patients:  EntrepreneurPulse.com.au  Fact Sheet for Healthcare Providers:  IncredibleEmployment.be  This test is no t yet approved or cleared by the [place] FDA and  has been authorized for detection and/or diagnosis of SARS-CoV-2 by FDA under an Emergency Use Authorization (EUA). This EUA will remain  in effect (meaning this test can be used) for the duration of the COVID-19 declaration under Section 564(b)(1) of the Act, 21 U.S.C.section 360bbb-3(b)(1), unless the authorization is terminated  or revoked sooner.       Influenza A by PCR NEGATIVE NEGATIVE Final   Influenza B by PCR NEGATIVE NEGATIVE Final    Comment: (NOTE) The Xpert Xpress SARS-CoV-2/FLU/RSV plus assay is intended as an aid in the diagnosis of influenza from Nasopharyngeal swab specimens and should not be used as a sole basis for treatment. Nasal washings and aspirates are unacceptable for Xpert Xpress SARS-CoV-2/FLU/RSV testing.  Fact Sheet for Patients: EntrepreneurPulse.com.au  Fact Sheet for Healthcare Providers: IncredibleEmployment.be  This test is not yet approved or cleared by the [place] FDA and has been authorized for detection and/or diagnosis of SARS-CoV-2 by FDA under an Emergency Use Authorization (EUA). This EUA will remain in effect (meaning this test can be used) for the duration of the COVID-19 declaration under Section 564(b)(1) of the  Act, 21 U.S.C. section 360bbb-3(b)(1), unless the authorization is terminated or revoked.  Performed at [facility], [place] [address], [place], [place] [address]         Radiology Studies: CT Head Wo Contrast  Result Date: 09/16/2021 CLINICAL DATA:  Altered mental status EXAM: CT HEAD WITHOUT CONTRAST TECHNIQUE: Contiguous axial images were obtained from the base of the skull through the vertex without intravenous contrast. RADIATION DOSE REDUCTION: This exam was performed according to the departmental dose-optimization program which includes automated exposure control, adjustment of the mA and/or kV according to patient size and/or use of iterative reconstruction technique. COMPARISON:  CT head 07/23/2021 FINDINGS: Brain: There is no evidence of acute intracranial hemorrhage, extra-axial fluid collection, or acute infarct. Parenchymal volume is normal. The ventricles are normal in size. Patchy hypodensity throughout the subcortical and periventricular white matter likely reflects sequela of moderate chronic white matter microangiopathy. There is no mass lesion.  There is no midline shift. Vascular: There is calcification of the bilateral cavernous ICAs. Skull: Normal. Negative for fracture or focal lesion. Sinuses/Orbits: The  paranasal sinuses are clear. The globes and orbits are unremarkable. Other: None. IMPRESSION: No acute intracranial pathology. Electronically Signed   By: Valetta Mole M.D.   On: 09/16/2021 08:11   CT Angio Abd/Pel w/ and/or w/o  Result Date: 09/16/2021 CLINICAL DATA:  76 year old male history of cirrhosis, hepatocellular carcinoma, gastrointestinal hemorrhage, and chronic anemia status post TIPS creation with gastroesophageal variceal coil embolization on 01/13/2021. EXAM: CT ANGIOGRAPHY ABDOMEN AND PELVIS WITH CONTRAST AND WITHOUT CONTRAST TECHNIQUE: Multidetector CT imaging of the abdomen and pelvis was performed using the standard protocol during bolus  administration of intravenous contrast. Multiplanar reconstructed images and MIPs were obtained and reviewed to evaluate the vascular anatomy. CONTRAST:  160mL OMNIPAQUE IOHEXOL 350 MG/ML SOLN COMPARISON:  09/08/2021, 01/29/2021, 01/13/2021, 01/08/2021 FINDINGS: VASCULAR Aorta: Atherosclerosis without signficant stenosis, dissection, or aneurysm. Celiac: Obscured secondary to streak artifact. SMA: Patent without evidence of aneurysm, dissection, vasculitis or significant stenosis. Renals: Similar appearing ostial occlusion versus high-grade stenosis of the single right renal artery secondary to atherosclerotic plaque. The artery is patent distally. Dual left renal arteries appear patent with mild ostial stenosis of the superior left renal artery. IMA: Patent without evidence of aneurysm, dissection, vasculitis or significant stenosis. Inflow: Patent without evidence of aneurysm, dissection, vasculitis or significant stenosis. Scattered atherosclerotic calcifications. Proximal Outflow: Bilateral common femoral and visualized portions of the superficial and profunda femoral arteries are patent without evidence of aneurysm, dissection, vasculitis or significant stenosis. Veins: The a patent veins are limited in visualization due to contrast bolus timing. Indwelling TIPS appears patent. The main portal, SMV common splenic vein appear patent. Status post coil embolization of 3 separate gastroesophageal varices arising from the splenic vein without definite evidence of recanalization. There are few mildly prominent distal esophageal varices present, decreased in size from pre-TIPS comparison. No evidence of splenorenal shunt. The renal veins are patent. No evidence of iliocaval thrombosis or anomaly. Review of the MIP images confirms the above findings. NON-VASCULAR Lower chest: Similar appearing rounded masslike consolidation about the sub pleural a posterior right lower lobe which measures up to proximally 4.1 x 3.6 cm  in maximum axial dimensions. Similar appearing small right trace left pleural effusions. Similar appearing mild cardiomegaly. No pericardial effusion. Hepatobiliary: Similar appearing morphologic changes of cirrhosis including contour nodularity and right lobe atrophy. Similar appearing hypoattenuating subcapsular segment 8 mass measuring up to proximally 2.6 x 2.5 cm in maximum axial dimensions without arterial hyperenhancement or washout. No new hepatomas. The gallbladder is present unremarkable. No intra or extrahepatic biliary ductal dilation. TIPS endograft in place, unchanged. Pancreas: Unremarkable. No pancreatic ductal dilatation or surrounding inflammatory changes. Spleen: Similar appearing scattered simple cysts and multifocal punctate calcified granuloma. No splenomegaly. Adrenals/Urinary Tract: Limited evaluation of the adrenal glands secondary to streak artifact. Persistent right renal atrophy. Unchanged calcified interpolar right renal calculus measuring up to 1.5 cm. No evidence of renal mass. No hydronephrosis. The bladder is mostly decompressed without significant wall thickening. Stomach/Bowel: Stomach is within normal limits. Appendix is not definitively identified. No evidence of bowel wall thickening, distention, or inflammatory changes. Lymphatic: No abdominopelvic lymphadenopathy. Reproductive: Prostate is unremarkable. Other: No ascites. Similar appearing tiny fat containing umbilical hernia. Musculoskeletal: Diffuse body wall anasarca. No acute or significant osseous findings. IMPRESSION: VASCULAR 1. Patent TIPS. A few small esophageal varices remain visible, decreased from pre-TIPS comparison. No evidence of definite recanalization of the previously embolized gastric varices. No evidence of development of ectopic varix. 2. Similar appearing ostial occlusion versus high-grade stenosis of the single right  renal artery with associated right renal atrophy. 3.  Aortic Atherosclerosis  (ICD10-I70.0). NON-VASCULAR 1. Similar appearing rounded masslike opacity in the right lower lobe which measures up to 4.1 cm and remains suspicious for primary bronchogenic neoplasm. 2. Similar appearing changes of cirrhosis with unchanged appearance of previously treated (radiation segmentectomy) right lobe hepatocellular carcinoma (LI-RADS TR-nonviable). No new hepatoma. 3. Similar appearing nonobstructive right nephrolithiasis. Ruthann Cancer, MD [facility] Radiology Electronically Signed   By: Ruthann Cancer M.D.   On: 09/16/2021 11:34        Scheduled Meds:  enoxaparin (LOVENOX) injection  40 mg Subcutaneous Q24H   ferrous sulfate  325 mg Oral Q breakfast   furosemide  40 mg Oral Daily   lactulose  30 g Oral TID   pantoprazole  40 mg Oral BID AC   rifaximin  550 mg Oral BID   spironolactone  50 mg Oral Daily   Continuous Infusions:  dextrose 5 % and 0.45% NaCl 50 mL/hr at 09/16/21 1844     LOS: 0 days     Cordelia Poche, MD [facility] 09/17/2021, 3:52 PM  If 7PM-7AM, please contact night-coverage [email or web address]

## 2021-09-17 NOTE — Progress Notes (Signed)
Transition of Care [facility]) Screening Note  Patient Details  Name: CASSADY TURANO Date of Birth: 04/25/1946  Transition of Care [facility]) CM/SW Contact:    Sherie Don, LCSW Phone Number: 09/17/2021, 9:44 AM  Transition of Care Department [facility]) has reviewed patient and no TOC needs have been identified at this time. We will continue to monitor patient advancement through interdisciplinary progression rounds. If new patient transition needs arise, please place a TOC consult.

## 2021-09-18 DIAGNOSIS — K7682 Hepatic encephalopathy: Secondary | ICD-10-CM | POA: Diagnosis not present

## 2021-09-18 DIAGNOSIS — E118 Type 2 diabetes mellitus with unspecified complications: Secondary | ICD-10-CM | POA: Diagnosis not present

## 2021-09-18 DIAGNOSIS — D61818 Other pancytopenia: Secondary | ICD-10-CM | POA: Diagnosis not present

## 2021-09-18 DIAGNOSIS — K279 Peptic ulcer, site unspecified, unspecified as acute or chronic, without hemorrhage or perforation: Secondary | ICD-10-CM | POA: Diagnosis not present

## 2021-09-18 DIAGNOSIS — G929 Unspecified toxic encephalopathy: Secondary | ICD-10-CM | POA: Diagnosis not present

## 2021-09-18 DIAGNOSIS — R69 Illness, unspecified: Secondary | ICD-10-CM | POA: Diagnosis not present

## 2021-09-18 NOTE — Discharge Summary (Signed)
Physician Discharge Summary  [name] BZJ:[id number] DOB: Jan 03, 1946 DOA: 09/16/2021  PCP: John Koch, MD  Admit date: 09/16/2021 Discharge date: 09/18/2021  Admitted From: Home Disposition: Home  Recommendations for Outpatient Follow-up:  Follow up with PCP in 1 week Please follow up on the following pending results: None  Home Health: PT/OT Equipment/Devices: None  Discharge Condition: Stable CODE STATUS: DNR Diet recommendation: Low sodium diet   Brief/Interim Summary:    HPI: [name] is a 76 y.o. male with medical history significant of hepatocellular carcinoma, hepatic cirrhosis, diabetes mellitus type 2 (well controlled), portal hypertension with gastric varices s/p TIPS. Patient presented secondary to altered mental status in setting of accidental ingestion of THC infused gummy candy. Patient unable to provide history secondary to lethargy. Per EDP, patient presented after he ingested multiple THC infused gummy candies accidentally; they belonged to his son. He has remained lethargic since presenting to the ED.   Hospital course:  * Toxic encephalopathy- (present on admission) Secondary to accidental THC ingestion. Discussed case with poison control with recommendations for supportive care. Resolved without specific treatment. PT/OT initially recommended SNF, but recommendations were updated once lethargy improved and are now recommending home with home health.  Pancytopenia ([place])- (present on admission) Chronic and stable.  Peptic ulcer disease- (present on admission) Continue Protonix  Hepatic cirrhosis ([place])- (present on admission) History of gastric varices. S/p tips. Secondary to alcohol in addition to history of hepatocellular carcinoma. Appears to be stable. Continue lasix, spironolactone and bowel regimen.  Hepatic encephalopathy-resolved as of 06/15/2021, (present on admission) Chronic. Ammonia of 44 on admission. Unlikely contributed  to admission presentation. Continue Xifaxan and lactulose.    Discharge Diagnoses:  Principal Problem:   Toxic encephalopathy Active Problems:   Type 2 diabetes with complication (HCC)   Hepatic cirrhosis (HCC)   Peptic ulcer disease   Pancytopenia ([place])    Discharge Instructions   Allergies as of 09/18/2021       Reactions   Fluzone Quadrivalent [influenza Vac Split Quad] Other (See Comments)   The patient stated, in 10/2020: "I am not taking any more flu shots. It liked to have killed me."        Medication List     TAKE these medications    Accu-Chek Softclix Lancets lancets USE AS DIRECTED TO TEST BLOOD SUGAR FOUR TIMES DAILY   albuterol 108 (90 Base) MCG/ACT inhaler Commonly known as: VENTOLIN HFA Inhale 2 puffs into the lungs every 6 (six) hours as needed for wheezing or shortness of breath.   Anoro Ellipta 62.5-25 MCG/ACT Aepb Generic drug: umeclidinium-vilanterol Inhale 1 puff into the lungs daily.   blood glucose meter kit and supplies Dispense based on patient and insurance preference. Use up to four times daily as directed. (FOR ICD-10 E10.9, E11.9).   ferrous sulfate 325 (65 FE) MG tablet Take 1 tablet (325 mg total) by mouth daily with breakfast.   furosemide 40 MG tablet Commonly known as: LASIX Take 1 tablet (40 mg total) by mouth daily.   lactulose 10 GM/15ML solution Commonly known as: CHRONULAC Take 45 mLs (30 g total) by mouth 3 (three) times daily.   pantoprazole 40 MG tablet Commonly known as: PROTONIX Take 1 tablet (40 mg total) by mouth 2 (two) times daily before a meal.   rifaximin 550 MG Tabs tablet Commonly known as: XIFAXAN Take 1 tablet (550 mg total) by mouth 2 (two) times daily.   spironolactone 50 MG tablet Commonly known as:  ALDACTONE Take 1 tablet (50 mg total) by mouth daily.        Follow-up Information     John Koch, MD. Schedule an appointment as soon as possible for a visit in 1 week(s).    Specialty: Internal Medicine Why: For hospital follow-up Contact information: [place] [address] [phone]                Allergies  Allergen Reactions   Fluzone Quadrivalent [Influenza Vac Split Quad] Other (See Comments)    The patient stated, in 10/2020: "I am not taking any more flu shots. It liked to have killed me."    Consultations: None   Procedures/Studies: CT Head Wo Contrast  Result Date: 09/16/2021 CLINICAL DATA:  Altered mental status EXAM: CT HEAD WITHOUT CONTRAST TECHNIQUE: Contiguous axial images were obtained from the base of the skull through the vertex without intravenous contrast. RADIATION DOSE REDUCTION: This exam was performed according to the departmental dose-optimization program which includes automated exposure control, adjustment of the mA and/or kV according to patient size and/or use of iterative reconstruction technique. COMPARISON:  CT head 07/23/2021 FINDINGS: Brain: There is no evidence of acute intracranial hemorrhage, extra-axial fluid collection, or acute infarct. Parenchymal volume is normal. The ventricles are normal in size. Patchy hypodensity throughout the subcortical and periventricular white matter likely reflects sequela of moderate chronic white matter microangiopathy. There is no mass lesion.  There is no midline shift. Vascular: There is calcification of the bilateral cavernous ICAs. Skull: Normal. Negative for fracture or focal lesion. Sinuses/Orbits: The paranasal sinuses are clear. The globes and orbits are unremarkable. Other: None. IMPRESSION: No acute intracranial pathology. Electronically Signed   By: Valetta Mole M.D.   On: 09/16/2021 08:11   CT CHEST WO CONTRAST  Result Date: 09/10/2021 CLINICAL DATA:  Abnormal chest x-ray. Lung opacities. History of CHF. EXAM: CT CHEST WITHOUT CONTRAST TECHNIQUE: Multidetector CT imaging of the chest was performed following the standard protocol without IV contrast.  RADIATION DOSE REDUCTION: This exam was performed according to the departmental dose-optimization program which includes automated exposure control, adjustment of the mA and/or kV according to patient size and/or use of iterative reconstruction technique. COMPARISON:  CT abdomen and pelvis from yesterday. CTA chest dated December 27, 2019. CT chest dated October 01, 2017. FINDINGS: Cardiovascular: Unchanged mild cardiomegaly. No pericardial effusion. No thoracic aortic aneurysm. Coronary, aortic arch, and branch vessel atherosclerotic vascular disease. Mediastinum/Nodes: New mildly enlarged right paratracheal lymph nodes measuring up to 1.4 cm in short axis (series 3, image 49). New subcarinal lymphadenopathy measuring up to 2.1 cm in short axis (series 3, image 78). Unchanged calcified mediastinal and bilateral hilar lymph nodes. No enlarged axillary lymph nodes. The thyroid gland, trachea, and esophagus demonstrate no significant findings. Lungs/Pleura: Small to moderate right and trace left pleural effusions. Round consolidation in the posterior right lower lobe is unchanged compared to yesterday's CT, but new since April 2021. Diffuse central peribronchial thickening and smooth interlobular septal thickening throughout both lungs. 7 mm nodule in the anterior right upper lobe (series 4, image 66), unchanged since April 2021, benign. No pneumothorax. Upper Abdomen: No acute abnormality.  No change from yesterday's CT. Musculoskeletal: No acute or significant osseous findings. IMPRESSION: 1. Round mass-like consolidation in the posterior right lower lobe is unchanged compared to yesterday's CT, but new since June 2022. In light of the new mediastinal lymphadenopathy and patient's smoking history, findings are concerning for primary bronchogenic neoplasm. Consultation with pulmonary medicine  or thoracic surgery is suggested, as clinically appropriate. 2. Small to moderate right and trace left pleural effusions with  mild to moderate interstitial pulmonary edema. 3. Aortic Atherosclerosis (ICD10-I70.0). Electronically Signed   By: Titus Dubin M.D.   On: 09/10/2021 09:14   CT ABDOMEN PELVIS W CONTRAST  Result Date: 09/09/2021 CLINICAL DATA:  Cirrhosis. Hepatocellular carcinoma. Previous TIPS. Upper GI bleeding. Weakness and confusion. EXAM: CT ABDOMEN AND PELVIS WITH CONTRAST TECHNIQUE: Multidetector CT imaging of the abdomen and pelvis was performed using the standard protocol following bolus administration of intravenous contrast. CONTRAST:  169m OMNIPAQUE IOHEXOL 300 MG/ML  SOLN COMPARISON:  Noncontrast CT on 01/29/2021 FINDINGS: Lower Chest: New moderate right pleural effusion and tiny left pleural effusion are seen. New rounded area of consolidation in the posterior right lower lobe measures 3 x 4 cm, and may be inflammatory, infectious, or neoplastic in etiology. Hepatobiliary: Hepatic cirrhosis again demonstrated. A subcapsular cyst measuring 3 cm in the lateral liver dome remains stable. No suspicious liver lesions are identified. TIPS is again seen, with patent portal veins and IVC. Hepatic veins are also patent. Gallbladder is unremarkable. No evidence of biliary ductal dilatation. Pancreas:  No mass or inflammatory changes. Spleen: Within normal limits in size. A few tiny cysts and calcified granulomata are stable. No masses identified. Adrenals/Urinary Tract: No masses identified. Diffuse right renal atrophy is seen as well as a 13 mm right renal calculus. No evidence of ureteral calculi or hydronephrosis. Urinary bladder is distended but otherwise unremarkable in appearance. Stomach/Bowel: No evidence of obstruction, inflammatory process or abnormal fluid collections. Large stool burden seen throughout the colon. Vascular/Lymphatic: No pathologically enlarged lymph nodes. No acute vascular findings. Aortic atherosclerotic calcification noted. Recanalization of paraumbilical veins and esophageal varices again  seen, consistent with portal venous hypertension. Reproductive:  No mass or other significant abnormality. Other:  Mild diffuse body wall edema, but no evidence ascites. Musculoskeletal:  No suspicious bone lesions identified. : New moderate right pleural effusion and tiny left pleural effusion. New masslike consolidation in posterior right lower lobe, which may be inflammatory, infectious, or neoplastic in etiology. Consider chest CT for further evaluation. No acute findings in the abdomen or pelvis. Hepatic cirrhosis and findings of portal venous hypertension. No evidence of recurrent hepatocellular carcinoma or metastatic disease. Large stool burden noted; recommend clinical correlation for possible constipation. Right renal atrophy and nephrolithiasis. No evidence of ureteral calculi or hydronephrosis. Aortic Atherosclerosis (ICD10-I70.0). Electronically Signed   By: JMarlaine HindM.D.   On: 09/09/2021 15:13   DG CHEST PORT 1 VIEW  Result Date: 09/11/2021 CLINICAL DATA:  Altered mental status.  Shortness of breath EXAM: PORTABLE CHEST 1 VIEW COMPARISON:  09/08/2021, 09/10/2021 FINDINGS: Stable cardiomegaly status post CABG. Aortic atherosclerosis. Diffuse interstitial prominence compatible with edema. Known posterior right lower lobe mass-like consolidation is not well seen radiographically. Trace right pleural effusion. No pneumothorax. IMPRESSION: 1. Cardiomegaly with interstitial pulmonary edema. 2. Known posterior right lower lobe mass-like consolidation is not well seen radiographically. Electronically Signed   By: NDavina PokeD.O.   On: 09/11/2021 14:30   DG Chest Port 1 View  Result Date: 09/08/2021 CLINICAL DATA:  Weakness, confused, disoriented, history of CHF EXAM: PORTABLE CHEST 1 VIEW COMPARISON:  Chest radiograph 05/28/2021 FINDINGS: Median sternotomy wires and mediastinal surgical clips are again noted. The heart is enlarged, unchanged. The mediastinal contours are grossly stable. There  are diffusely increased interstitial markings throughout both lungs along with central vascular congestion. There is no focal consolidation.  There is no significant pleural effusion. There is no pneumothorax There is no acute osseous abnormality. IMPRESSION: Cardiomegaly with probable mild to moderate pulmonary interstitial edema. No focal consolidation or significant pleural effusion. Electronically Signed   By: Valetta Mole M.D.   On: 09/08/2021 18:10   CT Angio Abd/Pel w/ and/or w/o  Result Date: 09/16/2021 CLINICAL DATA:  76 year old male history of cirrhosis, hepatocellular carcinoma, gastrointestinal hemorrhage, and chronic anemia status post TIPS creation with gastroesophageal variceal coil embolization on 01/13/2021. EXAM: CT ANGIOGRAPHY ABDOMEN AND PELVIS WITH CONTRAST AND WITHOUT CONTRAST TECHNIQUE: Multidetector CT imaging of the abdomen and pelvis was performed using the standard protocol during bolus administration of intravenous contrast. Multiplanar reconstructed images and MIPs were obtained and reviewed to evaluate the vascular anatomy. CONTRAST:  165m OMNIPAQUE IOHEXOL 350 MG/ML SOLN COMPARISON:  09/08/2021, 01/29/2021, 01/13/2021, 01/08/2021 FINDINGS: VASCULAR Aorta: Atherosclerosis without signficant stenosis, dissection, or aneurysm. Celiac: Obscured secondary to streak artifact. SMA: Patent without evidence of aneurysm, dissection, vasculitis or significant stenosis. Renals: Similar appearing ostial occlusion versus high-grade stenosis of the single right renal artery secondary to atherosclerotic plaque. The artery is patent distally. Dual left renal arteries appear patent with mild ostial stenosis of the superior left renal artery. IMA: Patent without evidence of aneurysm, dissection, vasculitis or significant stenosis. Inflow: Patent without evidence of aneurysm, dissection, vasculitis or significant stenosis. Scattered atherosclerotic calcifications. Proximal Outflow: Bilateral common  femoral and visualized portions of the superficial and profunda femoral arteries are patent without evidence of aneurysm, dissection, vasculitis or significant stenosis. Veins: The a patent veins are limited in visualization due to contrast bolus timing. Indwelling TIPS appears patent. The main portal, SMV common splenic vein appear patent. Status post coil embolization of 3 separate gastroesophageal varices arising from the splenic vein without definite evidence of recanalization. There are few mildly prominent distal esophageal varices present, decreased in size from pre-TIPS comparison. No evidence of splenorenal shunt. The renal veins are patent. No evidence of iliocaval thrombosis or anomaly. Review of the MIP images confirms the above findings. NON-VASCULAR Lower chest: Similar appearing rounded masslike consolidation about the sub pleural a posterior right lower lobe which measures up to proximally 4.1 x 3.6 cm in maximum axial dimensions. Similar appearing small right trace left pleural effusions. Similar appearing mild cardiomegaly. No pericardial effusion. Hepatobiliary: Similar appearing morphologic changes of cirrhosis including contour nodularity and right lobe atrophy. Similar appearing hypoattenuating subcapsular segment 8 mass measuring up to proximally 2.6 x 2.5 cm in maximum axial dimensions without arterial hyperenhancement or washout. No new hepatomas. The gallbladder is present unremarkable. No intra or extrahepatic biliary ductal dilation. TIPS endograft in place, unchanged. Pancreas: Unremarkable. No pancreatic ductal dilatation or surrounding inflammatory changes. Spleen: Similar appearing scattered simple cysts and multifocal punctate calcified granuloma. No splenomegaly. Adrenals/Urinary Tract: Limited evaluation of the adrenal glands secondary to streak artifact. Persistent right renal atrophy. Unchanged calcified interpolar right renal calculus measuring up to 1.5 cm. No evidence of renal  mass. No hydronephrosis. The bladder is mostly decompressed without significant wall thickening. Stomach/Bowel: Stomach is within normal limits. Appendix is not definitively identified. No evidence of bowel wall thickening, distention, or inflammatory changes. Lymphatic: No abdominopelvic lymphadenopathy. Reproductive: Prostate is unremarkable. Other: No ascites. Similar appearing tiny fat containing umbilical hernia. Musculoskeletal: Diffuse body wall anasarca. No acute or significant osseous findings. IMPRESSION: VASCULAR 1. Patent TIPS. A few small esophageal varices remain visible, decreased from pre-TIPS comparison. No evidence of definite recanalization of the previously embolized gastric varices. No evidence of  development of ectopic varix. 2. Similar appearing ostial occlusion versus high-grade stenosis of the single right renal artery with associated right renal atrophy. 3.  Aortic Atherosclerosis (ICD10-I70.0). NON-VASCULAR 1. Similar appearing rounded masslike opacity in the right lower lobe which measures up to 4.1 cm and remains suspicious for primary bronchogenic neoplasm. 2. Similar appearing changes of cirrhosis with unchanged appearance of previously treated (radiation segmentectomy) right lobe hepatocellular carcinoma (LI-RADS TR-nonviable). No new hepatoma. 3. Similar appearing nonobstructive right nephrolithiasis. Ruthann Cancer, MD [facility] Electronically Signed   By: Ruthann Cancer M.D.   On: 09/16/2021 11:34      Subjective: No issues this morning. Hoping to go home rather than rehab. Some mild abdominal pain.  Discharge Exam: Vitals:   09/18/21 0543 09/18/21 1426  BP: (!) 130/53 (!) 129/43  Pulse: 69 73  Resp: 18 17  Temp: 98.3 F (36.8 C) 98.3 F (36.8 C)  SpO2: 100% 100%   Vitals:   09/17/21 1422 09/17/21 1946 09/18/21 0543 09/18/21 1426  BP: (!) 112/52 (!) 116/53 (!) 130/53 (!) 129/43  Pulse: 62 70 69 73   Resp: '20 18 18 17  ' Temp: 97.9 F (36.6 C) 98.2 F (36.8 C) 98.3 F (36.8 C) 98.3 F (36.8 C)  TempSrc: Oral Oral Oral Oral  SpO2: 97% 100% 100% 100%  Weight:      Height:        General: Pt is alert, awake, not in acute distress Cardiovascular: RRR, S1/S2 +, no rubs, no gallops Respiratory: CTA bilaterally, no wheezing, no rhonchi Abdominal: Soft, NT, distended, bowel sounds + Extremities: Trace BLE edema, no cyanosis    The results of significant diagnostics from this hospitalization (including imaging, microbiology, ancillary and laboratory) are listed below for reference.     Microbiology: Recent Results (from the past 240 hour(s))  Resp Panel by RT-PCR (Flu A&B, Covid) Nasopharyngeal Swab     Status: None   Collection Time: 09/08/21  5:08 PM   Specimen: Nasopharyngeal Swab; Nasopharyngeal(NP) swabs in vial transport medium  Result Value Ref Range Status   SARS Coronavirus 2 by RT PCR NEGATIVE NEGATIVE Final    Comment: (NOTE) SARS-CoV-2 target nucleic acids are NOT DETECTED.  The SARS-CoV-2 RNA is generally detectable in upper respiratory specimens during the acute phase of infection. The lowest concentration of SARS-CoV-2 viral copies this assay can detect is 138 copies/mL. A negative result does not preclude SARS-Cov-2 infection and should not be used as the sole basis for treatment or other patient management decisions. A negative result may occur with  improper specimen collection/handling, submission of specimen other than nasopharyngeal swab, presence of viral mutation(s) within the areas targeted by this assay, and inadequate number of viral copies(<138 copies/mL). A negative result must be combined with clinical observations, patient history, and epidemiological information. The expected result is Negative.  Fact Sheet for Patients:  EntrepreneurPulse.com.au  Fact Sheet for Healthcare Providers:   IncredibleEmployment.be  This test is no t yet approved or cleared by the [place] FDA and  has been authorized for detection and/or diagnosis of SARS-CoV-2 by FDA under an Emergency Use Authorization (EUA). This EUA will remain  in effect (meaning this test can be used) for the duration of the COVID-19 declaration under Section 564(b)(1) of the Act, 21 U.S.C.section 360bbb-3(b)(1), unless the authorization is terminated  or revoked sooner.       Influenza A by PCR NEGATIVE NEGATIVE Final   Influenza B by PCR NEGATIVE NEGATIVE Final  Comment: (NOTE) The Xpert Xpress SARS-CoV-2/FLU/RSV plus assay is intended as an aid in the diagnosis of influenza from Nasopharyngeal swab specimens and should not be used as a sole basis for treatment. Nasal washings and aspirates are unacceptable for Xpert Xpress SARS-CoV-2/FLU/RSV testing.  Fact Sheet for Patients: EntrepreneurPulse.com.au  Fact Sheet for Healthcare Providers: IncredibleEmployment.be  This test is not yet approved or cleared by the [place] FDA and has been authorized for detection and/or diagnosis of SARS-CoV-2 by FDA under an Emergency Use Authorization (EUA). This EUA will remain in effect (meaning this test can be used) for the duration of the COVID-19 declaration under Section 564(b)(1) of the Act, 21 U.S.C. section 360bbb-3(b)(1), unless the authorization is terminated or revoked.  Performed at [facility], [place] [address] [place], [place] [address]   Body fluid culture w Gram Stain     Status: None   Collection Time: 09/11/21  1:53 PM   Specimen: Pleural Fluid  Result Value Ref Range Status   Specimen Description PLEURAL FLUID  Final   Special Requests NONE  Final   Gram Stain CYTOSPIN SMEAR FEW WBC SEEN NO ORGANISMS SEEN   Final   Culture   Final    NO GROWTH 3 DAYS Performed at [facility], [place] [address] [place], [place]  [address]    Report Status 09/14/2021 FINAL  Final  Resp Panel by RT-PCR (Flu A&B, Covid) Nasopharyngeal Swab     Status: None   Collection Time: 09/16/21  5:35 PM   Specimen: Nasopharyngeal Swab; Nasopharyngeal(NP) swabs in vial transport medium  Result Value Ref Range Status   SARS Coronavirus 2 by RT PCR NEGATIVE NEGATIVE Final    Comment: (NOTE) SARS-CoV-2 target nucleic acids are NOT DETECTED.  The SARS-CoV-2 RNA is generally detectable in upper respiratory specimens during the acute phase of infection. The lowest concentration of SARS-CoV-2 viral copies this assay can detect is 138 copies/mL. A negative result does not preclude SARS-Cov-2 infection and should not be used as the sole basis for treatment or other patient management decisions. A negative result may occur with  improper specimen collection/handling, submission of specimen other than nasopharyngeal swab, presence of viral mutation(s) within the areas targeted by this assay, and inadequate number of viral copies(<138 copies/mL). A negative result must be combined with clinical observations, patient history, and epidemiological information. The expected result is Negative.  Fact Sheet for Patients:  EntrepreneurPulse.com.au  Fact Sheet for Healthcare Providers:  IncredibleEmployment.be  This test is no t yet approved or cleared by the [place] FDA and  has been authorized for detection and/or diagnosis of SARS-CoV-2 by FDA under an Emergency Use Authorization (EUA). This EUA will remain  in effect (meaning this test can be used) for the duration of the COVID-19 declaration under Section 564(b)(1) of the Act, 21 U.S.C.section 360bbb-3(b)(1), unless the authorization is terminated  or revoked sooner.       Influenza A by PCR NEGATIVE NEGATIVE Final   Influenza B by PCR NEGATIVE NEGATIVE Final    Comment: (NOTE) The Xpert Xpress SARS-CoV-2/FLU/RSV plus assay is intended as an  aid in the diagnosis of influenza from Nasopharyngeal swab specimens and should not be used as a sole basis for treatment. Nasal washings and aspirates are unacceptable for Xpert Xpress SARS-CoV-2/FLU/RSV testing.  Fact Sheet for Patients: EntrepreneurPulse.com.au  Fact Sheet for Healthcare Providers: IncredibleEmployment.be  This test is not yet approved or cleared by the [place] and has been authorized for  detection and/or diagnosis of SARS-CoV-2 by FDA under an Emergency Use Authorization (EUA). This EUA will remain in effect (meaning this test can be used) for the duration of the COVID-19 declaration under Section 564(b)(1) of the Act, 21 U.S.C. section 360bbb-3(b)(1), unless the authorization is terminated or revoked.  Performed at [facility], [place] [address], [place], [place] [address]      Labs: BNP (last 3 results) Recent Labs    03/04/21 1310 04/28/21 1843 05/28/21 0125  BNP 432.3* 361.3* 850.2*   Basic Metabolic Panel: Recent Labs  Lab 09/12/21 0822 09/16/21 0055 09/16/21 0105  NA 139 131* 134*  K 3.9 4.1 4.4  CL 110 101 101  CO2 21* 23  --   GLUCOSE 91 112* 110*  BUN '13 15 13  ' CREATININE 1.07 0.97 1.00  CALCIUM 7.9* 8.4*  --   MG 1.9  --   --    Liver Function Tests: Recent Labs  Lab 09/12/21 0822 09/16/21 0055  AST 29 43*  ALT 20 26  ALKPHOS 120 156*  BILITOT 1.1 1.4*  PROT 5.1* 6.2*  ALBUMIN 2.2* 2.9*   No results for input(s): LIPASE, AMYLASE in the last 168 hours. Recent Labs  Lab 09/16/21 0055  AMMONIA 44*   CBC: Recent Labs  Lab 09/12/21 0822 09/16/21 0055 09/16/21 0105  WBC 3.4* 3.8*  --   NEUTROABS 2.2  --   --   HGB 7.4* 9.0* 9.9*  HCT 24.3* 28.5* 29.0*  MCV 85.3 85.8  --   PLT 93* 120*  --    Cardiac Enzymes: No results for input(s): CKTOTAL, CKMB, CKMBINDEX, TROPONINI in the last 168 hours. BNP: Invalid input(s): POCBNP CBG: Recent Labs  Lab  09/16/21 1726  GLUCAP 79   D-Dimer No results for input(s): DDIMER in the last 72 hours. Hgb A1c No results for input(s): HGBA1C in the last 72 hours. Lipid Profile No results for input(s): CHOL, HDL, LDLCALC, TRIG, CHOLHDL, LDLDIRECT in the last 72 hours. Thyroid function studies No results for input(s): TSH, T4TOTAL, T3FREE, THYROIDAB in the last 72 hours.  Invalid input(s): FREET3 Anemia work up No results for input(s): VITAMINB12, FOLATE, FERRITIN, TIBC, IRON, RETICCTPCT in the last 72 hours. Urinalysis    Component Value Date/Time   COLORURINE YELLOW 09/16/2021 [place] 09/16/2021 0642   LABSPEC 1.013 09/16/2021 0642   PHURINE 5.0 09/16/2021 0642   GLUCOSEU NEGATIVE 09/16/2021 0642   HGBUR SMALL (A) 09/16/2021 0642   BILIRUBINUR NEGATIVE 09/16/2021 0642   KETONESUR NEGATIVE 09/16/2021 0642   PROTEINUR NEGATIVE 09/16/2021 0642   UROBILINOGEN 0.2 03/29/2014 1546   NITRITE NEGATIVE 09/16/2021 0642   LEUKOCYTESUR NEGATIVE 09/16/2021 7741   Sepsis Labs Invalid input(s): PROCALCITONIN,  WBC,  LACTICIDVEN Microbiology Recent Results (from the past 240 hour(s))  Resp Panel by RT-PCR (Flu A&B, Covid) Nasopharyngeal Swab     Status: None   Collection Time: 09/08/21  5:08 PM   Specimen: Nasopharyngeal Swab; Nasopharyngeal(NP) swabs in vial transport medium  Result Value Ref Range Status   SARS Coronavirus 2 by RT PCR NEGATIVE NEGATIVE Final    Comment: (NOTE) SARS-CoV-2 target nucleic acids are NOT DETECTED.  The SARS-CoV-2 RNA is generally detectable in upper respiratory specimens during the acute phase of infection. The lowest concentration of SARS-CoV-2 viral copies this assay can detect is 138 copies/mL. A negative result does not preclude SARS-Cov-2 infection and should not be used as the sole basis for treatment or other patient management decisions. A  negative result may occur with  improper specimen collection/handling, submission of specimen  other than nasopharyngeal swab, presence of viral mutation(s) within the areas targeted by this assay, and inadequate number of viral copies(<138 copies/mL). A negative result must be combined with clinical observations, patient history, and epidemiological information. The expected result is Negative.  Fact Sheet for Patients:  EntrepreneurPulse.com.au  Fact Sheet for Healthcare Providers:  IncredibleEmployment.be  This test is no t yet approved or cleared by the [place] FDA and  has been authorized for detection and/or diagnosis of SARS-CoV-2 by FDA under an Emergency Use Authorization (EUA). This EUA will remain  in effect (meaning this test can be used) for the duration of the COVID-19 declaration under Section 564(b)(1) of the Act, 21 U.S.C.section 360bbb-3(b)(1), unless the authorization is terminated  or revoked sooner.       Influenza A by PCR NEGATIVE NEGATIVE Final   Influenza B by PCR NEGATIVE NEGATIVE Final    Comment: (NOTE) The Xpert Xpress SARS-CoV-2/FLU/RSV plus assay is intended as an aid in the diagnosis of influenza from Nasopharyngeal swab specimens and should not be used as a sole basis for treatment. Nasal washings and aspirates are unacceptable for Xpert Xpress SARS-CoV-2/FLU/RSV testing.  Fact Sheet for Patients: EntrepreneurPulse.com.au  Fact Sheet for Healthcare Providers: IncredibleEmployment.be  This test is not yet approved or cleared by the [place] FDA and has been authorized for detection and/or diagnosis of SARS-CoV-2 by FDA under an Emergency Use Authorization (EUA). This EUA will remain in effect (meaning this test can be used) for the duration of the COVID-19 declaration under Section 564(b)(1) of the Act, 21 U.S.C. section 360bbb-3(b)(1), unless the authorization is terminated or revoked.  Performed at [facility], [place] [address], [place],  [place] [address]   Body fluid culture w Gram Stain     Status: None   Collection Time: 09/11/21  1:53 PM   Specimen: Pleural Fluid  Result Value Ref Range Status   Specimen Description PLEURAL FLUID  Final   Special Requests NONE  Final   Gram Stain CYTOSPIN SMEAR FEW WBC SEEN NO ORGANISMS SEEN   Final   Culture   Final    NO GROWTH 3 DAYS Performed at [facility], [place] [address], [place], [place] [address]    Report Status 09/14/2021 FINAL  Final  Resp Panel by RT-PCR (Flu A&B, Covid) Nasopharyngeal Swab     Status: None   Collection Time: 09/16/21  5:35 PM   Specimen: Nasopharyngeal Swab; Nasopharyngeal(NP) swabs in vial transport medium  Result Value Ref Range Status   SARS Coronavirus 2 by RT PCR NEGATIVE NEGATIVE Final    Comment: (NOTE) SARS-CoV-2 target nucleic acids are NOT DETECTED.  The SARS-CoV-2 RNA is generally detectable in upper respiratory specimens during the acute phase of infection. The lowest concentration of SARS-CoV-2 viral copies this assay can detect is 138 copies/mL. A negative result does not preclude SARS-Cov-2 infection and should not be used as the sole basis for treatment or other patient management decisions. A negative result may occur with  improper specimen collection/handling, submission of specimen other than nasopharyngeal swab, presence of viral mutation(s) within the areas targeted by this assay, and inadequate number of viral copies(<138 copies/mL). A negative result must be combined with clinical observations, patient history, and epidemiological information. The expected result is Negative.  Fact Sheet for Patients:  EntrepreneurPulse.com.au  Fact Sheet for Healthcare Providers:  IncredibleEmployment.be  This test is no t yet approved  or cleared by the [place] and  has been authorized for detection and/or diagnosis of SARS-CoV-2 by FDA under an Emergency Use Authorization (EUA). This  EUA will remain  in effect (meaning this test can be used) for the duration of the COVID-19 declaration under Section 564(b)(1) of the Act, 21 U.S.C.section 360bbb-3(b)(1), unless the authorization is terminated  or revoked sooner.       Influenza A by PCR NEGATIVE NEGATIVE Final   Influenza B by PCR NEGATIVE NEGATIVE Final    Comment: (NOTE) The Xpert Xpress SARS-CoV-2/FLU/RSV plus assay is intended as an aid in the diagnosis of influenza from Nasopharyngeal swab specimens and should not be used as a sole basis for treatment. Nasal washings and aspirates are unacceptable for Xpert Xpress SARS-CoV-2/FLU/RSV testing.  Fact Sheet for Patients: EntrepreneurPulse.com.au  Fact Sheet for Healthcare Providers: IncredibleEmployment.be  This test is not yet approved or cleared by the [place] FDA and has been authorized for detection and/or diagnosis of SARS-CoV-2 by FDA under an Emergency Use Authorization (EUA). This EUA will remain in effect (meaning this test can be used) for the duration of the COVID-19 declaration under Section 564(b)(1) of the Act, 21 U.S.C. section 360bbb-3(b)(1), unless the authorization is terminated or revoked.  Performed at [facility], [place] [address], [place], [place] [address]      Time coordinating discharge: 35 minutes  SIGNED:   Cordelia Poche, MD [facility] 09/18/2021, 3:18 PM

## 2021-09-18 NOTE — TOC Transition Note (Signed)
Transition of Care [facility]) - CM/SW Discharge Note   Patient Details  Name: KAREEN HITSMAN MRN: [id number] Date of Birth: [birth year]  Transition of Care [facility]) CM/SW Contact:  Ross Ludwig, LCSW Phone Number: 09/18/2021, 4:03 PM   Clinical Narrative:     CSW spoke with patient regarding home health services.  Patient did not have a preference for agency, CSW contacted:  [facility] out of network [facility] Waiting for response back [facility] Can not accept [facility] Unable to accept [facility] Unable to accept [place] Waiting for response [facility] Waiting for response [facility] Unable to accept  [facility] agreed to accept patient for home health. CSW spoke to [facility], she said they can accept patient for home health services. Patient will be going home with home health PT and OT through [facility].  CSW signing off please reconsult with any other social work needs, home health agency has been notified of planned discharge.   Final next level of care: [place] Barriers to Discharge: Barriers Resolved   Patient Goals and CMS Choice Patient states their goals for this hospitalization and ongoing recovery are:: To return back home with home health PT and OT. CMS Medicare.gov Compare Post Acute Care list provided to:: Patient Choice offered to / list presented to : Patient  Discharge Placement                       Discharge Plan and Services                          HH Arranged: OT, PT [facility] Agency: [place] Date [place]: 09/18/21 Time [place]: 1602 Representative spoke with at [place]: [place] ([place]) Interventions     Readmission Risk Interventions Readmission Risk Prevention Plan 09/11/2021 01/26/2021 08/12/2020  Transportation Screening Complete Complete -  PCP or Specialist Appt within 5-7 Days - - -  Not Complete comments - - -  PCP or  Specialist Appt within 3-5 Days - - Complete  Home Care Screening - - -  Medication Review (RN CM) - - -  [place] or [place] for [place] - - -  Medication Review Press photographer) Complete Complete -  PCP or Specialist appointment within 3-5 days of discharge Complete Complete -  [place] or Home Care Consult Complete Complete -  SW Recovery Care/Counseling Consult Complete Complete -  Palliative Care Screening Not Applicable Not Applicable -  [place] Patient Refused Patient Refused -  Some recent data might be hidden

## 2021-09-18 NOTE — Progress Notes (Addendum)
Physical Therapy Treatment Patient Details Name: [name] MRN: [id number] DOB: 1945-12-15 Today's Date: 09/18/2021   History of Present Illness Pt is a 76 y/o male presenting to [facility] 1/[facility] secondary to altered mental status in setting of accidental ingestion of THC infused gummy candy which belong to pt's son. Found to have toxic encephalopathy. PMH includes recent hospitalization for GI bleed and weakness, liver cancer, hepatic cirrhosis, diabetes mellitus type 2  CHF, HTN, and CAD.    PT Comments    Pt was lethargic on eval and unable to fully participate.  Cased manger asked we see pt again today due to cognition improvement and readiness for D/C. Pt was in bed alert. General Comments: cognition improved but not yet at prior level.  Poor recall and slow to respond.  Still at functional level with instructions and able to express needs.  Pt was able to share how he ate a "whole bag of candy" that he did not know contained CBD. Assisted OOB to amb to bathroom.  General bed mobility comments: Pt self able to transition to EOB with increased time and use of rail.  Required Min Assist to support B LE up onto bed.  Slow moving.General transfer comment: pt self able to rise from elevated bed with increased time.  Slow and rigid.  No overt LOB.  Slight forward flex posture.  Pt required increased assist from lower toilet level.General Gait Details: pt was able to amb a functional distance in hallway with walker at Supervision level.  Slow but steady.  Slight forward flex posture and shuffled gait.  Pt stated he uses a "walking stick" usually but does have a Rollator when in community.  Only c/o was ABD pain/cramping. Assisted back to bed to elevate B LE.   Called Son while in room as pt could not recall where is cell phone and cane were.  Both at at home per Son.  Also, Son confirmed he would be with pt 24/7 "as before" to assist with all needs (ADL's,med management, mobility, etc)   Pt wants to D/C  to home vs SNF.  Will update LPT that plan is now to return home with family assist and resume [place] PT.  No equipment needed as pt has all. Pt ready for D/C when medically cleared.   Recommendations for follow up therapy are one component of a multi-disciplinary discharge planning process, led by the attending physician.  Recommendations may be updated based on patient status, additional functional criteria and insurance authorization.  Follow Up Recommendations  Home health PT     Assistance Recommended at Discharge Frequent or constant Supervision/Assistance  Patient can return home with the following Assistance with cooking/housework;Direct supervision/assist for medications management;Assist for transportation;A little help with bathing/dressing/bathroom;Help with stairs or ramp for entrance;Direct supervision/assist for financial management   Equipment Recommendations  None recommended by PT    Recommendations for Other Services       Precautions / Restrictions Precautions Precautions: Fall     Mobility  Bed Mobility Overal bed mobility: Needs Assistance Bed Mobility: Supine to Sit, Sit to Supine     Supine to sit: Supervision Sit to supine: Min assist   General bed mobility comments: Pt self able to transition to EOB with increased time and use of rail.  Required Min Assist to support B LE up onto bed.  Slow moving.    Transfers Overall transfer level: Needs assistance Equipment used: Rolling walker (2 wheels) Transfers: Sit to/from Stand Sit to Stand:  Min guard, Min assist           General transfer comment: pt self able to rise from elevated bed with increased time.  Slow and rigid.  No overt LOB.  Slight forward flex posture.  Pt required increased assist from lower toilet level.    Ambulation/Gait Ambulation/Gait assistance: Supervision Gait Distance (Feet): 85 Feet Assistive device: Rolling walker (2 wheels) Gait Pattern/deviations: Step-through pattern,  Decreased stride length, Trunk flexed, Shuffle Gait velocity: decreased     General Gait Details: pt was able to amb a functional distance in hallway with walker at Supervision level.  Slow but steady.  Slight forward flex posture and shuffled gait.  Pt stated he uses a "walking stick" usually but does have a Rollator when in community.  Only c/o was ABD pain/cramping.   Stairs             Wheelchair Mobility    Modified Rankin (Stroke Patients Only)       Balance                                            Cognition Arousal/Alertness: Awake/alert   Overall Cognitive Status: Impaired/Different from baseline (slightly impaired (memory recall)) Area of Impairment: Problem solving, Awareness, Safety/judgement, Following commands, Memory, Attention                     Memory: Decreased short-term memory         General Comments: cognition improved but not yet at prior level.  Poor recall and slow to respond.  Still at functional level with instructions and able to express needs.  Pt was able to share how he ate a "whole bag of candy" that he did not know contained CBD.        Exercises      General Comments        Pertinent Vitals/Pain Pain Assessment Pain Assessment: Faces Pain Location: ABD and stiff neck Pain Descriptors / Indicators: Cramping, Tightness Pain Intervention(s): Monitored during session, Repositioned    Home Living                          Prior Function            PT Goals (current goals can now be found in the care plan section) Progress towards PT goals: Progressing toward goals    Frequency    Min 3X/week      PT Plan Current plan remains appropriate    Co-evaluation              AM-PAC PT "6 Clicks" Mobility   Outcome Measure  Help needed turning from your back to your side while in a flat bed without using bedrails?: A Little Help needed moving from lying on your back to  sitting on the side of a flat bed without using bedrails?: A Little Help needed moving to and from a bed to a chair (including a wheelchair)?: A Little Help needed standing up from a chair using your arms (e.g., wheelchair or bedside chair)?: A Little Help needed to walk in hospital room?: A Little Help needed climbing 3-5 steps with a railing? : A Little 6 Click Score: 18    End of Session Equipment Utilized During Treatment: Gait belt Activity Tolerance: Patient tolerated treatment well Patient left: in bed;with  call bell/phone within reach;with bed alarm set Nurse Communication: Mobility status PT Visit Diagnosis: Unsteadiness on feet (R26.81);Muscle weakness (generalized) (M62.81)     Time: 1191-4782 PT Time Calculation (min) (ACUTE ONLY): 37 min  Charges:  $Gait Training: 8-22 mins $Therapeutic Activity: 8-22 mins                     {Gaylin Bulthuis  PTA Acute  Rehabilitation Services Pager      [phone] Office      [phone]

## 2021-09-20 DIAGNOSIS — Z951 Presence of aortocoronary bypass graft: Secondary | ICD-10-CM | POA: Diagnosis not present

## 2021-09-20 DIAGNOSIS — D509 Iron deficiency anemia, unspecified: Secondary | ICD-10-CM | POA: Diagnosis not present

## 2021-09-20 DIAGNOSIS — G928 Other toxic encephalopathy: Secondary | ICD-10-CM | POA: Diagnosis not present

## 2021-09-20 DIAGNOSIS — M545 Low back pain, unspecified: Secondary | ICD-10-CM | POA: Diagnosis not present

## 2021-09-20 DIAGNOSIS — K745 Biliary cirrhosis, unspecified: Secondary | ICD-10-CM | POA: Diagnosis not present

## 2021-09-20 DIAGNOSIS — I110 Hypertensive heart disease with heart failure: Secondary | ICD-10-CM | POA: Diagnosis not present

## 2021-09-20 DIAGNOSIS — M1000 Idiopathic gout, unspecified site: Secondary | ICD-10-CM | POA: Diagnosis not present

## 2021-09-20 DIAGNOSIS — N471 Phimosis: Secondary | ICD-10-CM | POA: Diagnosis not present

## 2021-09-20 DIAGNOSIS — M1990 Unspecified osteoarthritis, unspecified site: Secondary | ICD-10-CM | POA: Diagnosis not present

## 2021-09-20 DIAGNOSIS — Z7951 Long term (current) use of inhaled steroids: Secondary | ICD-10-CM | POA: Diagnosis not present

## 2021-09-20 DIAGNOSIS — I5032 Chronic diastolic (congestive) heart failure: Secondary | ICD-10-CM | POA: Diagnosis not present

## 2021-09-20 DIAGNOSIS — G4733 Obstructive sleep apnea (adult) (pediatric): Secondary | ICD-10-CM | POA: Diagnosis not present

## 2021-09-20 DIAGNOSIS — Z8505 Personal history of malignant neoplasm of liver: Secondary | ICD-10-CM | POA: Diagnosis not present

## 2021-09-20 DIAGNOSIS — I864 Gastric varices: Secondary | ICD-10-CM | POA: Diagnosis not present

## 2021-09-20 DIAGNOSIS — I2510 Atherosclerotic heart disease of native coronary artery without angina pectoris: Secondary | ICD-10-CM | POA: Diagnosis not present

## 2021-09-20 DIAGNOSIS — I700 Atherosclerosis of aorta: Secondary | ICD-10-CM | POA: Diagnosis not present

## 2021-09-20 DIAGNOSIS — K219 Gastro-esophageal reflux disease without esophagitis: Secondary | ICD-10-CM | POA: Diagnosis not present

## 2021-09-20 DIAGNOSIS — E119 Type 2 diabetes mellitus without complications: Secondary | ICD-10-CM | POA: Diagnosis not present

## 2021-09-20 DIAGNOSIS — E785 Hyperlipidemia, unspecified: Secondary | ICD-10-CM | POA: Diagnosis not present

## 2021-09-20 DIAGNOSIS — D61811 Other drug-induced pancytopenia: Secondary | ICD-10-CM | POA: Diagnosis not present

## 2021-09-20 DIAGNOSIS — R188 Other ascites: Secondary | ICD-10-CM | POA: Diagnosis not present

## 2021-09-20 DIAGNOSIS — K279 Peptic ulcer, site unspecified, unspecified as acute or chronic, without hemorrhage or perforation: Secondary | ICD-10-CM | POA: Diagnosis not present

## 2021-09-20 DIAGNOSIS — T40711D Poisoning by cannabis, accidental (unintentional), subsequent encounter: Secondary | ICD-10-CM | POA: Diagnosis not present

## 2021-09-20 DIAGNOSIS — R69 Illness, unspecified: Secondary | ICD-10-CM | POA: Diagnosis not present

## 2021-09-20 DIAGNOSIS — K766 Portal hypertension: Secondary | ICD-10-CM | POA: Diagnosis not present

## 2021-09-21 ENCOUNTER — Telehealth: Payer: Self-pay | Admitting: Hematology and Oncology

## 2021-09-21 ENCOUNTER — Inpatient Hospital Stay: Payer: Medicare HMO | Admitting: Hematology and Oncology

## 2021-09-21 ENCOUNTER — Telehealth: Payer: Self-pay | Admitting: Internal Medicine

## 2021-09-21 NOTE — Telephone Encounter (Signed)
Sch per 1/24 staff msg , pt son aware.

## 2021-09-21 NOTE — Telephone Encounter (Signed)
See below

## 2021-09-21 NOTE — Telephone Encounter (Signed)
[name] is a pt with [facility] and she called stating her plan of care will be 2 x 4 weeks for balance and gait training. Also wants to know if the patient should be monitoring his blood pressure or maintaining it by diet. [name] can be reached at [phone]

## 2021-09-21 NOTE — Telephone Encounter (Signed)
Okay for plan of care

## 2021-09-21 NOTE — Progress Notes (Deleted)
Patient no showed to appointment today

## 2021-09-22 ENCOUNTER — Ambulatory Visit
Admission: RE | Admit: 2021-09-22 | Discharge: 2021-09-22 | Disposition: A | Discharge status: Home / Self Care | Payer: Self-pay | Source: Ambulatory Visit | Attending: Interventional Radiology | Admitting: Interventional Radiology

## 2021-09-22 ENCOUNTER — Telehealth: Payer: Self-pay

## 2021-09-22 ENCOUNTER — Encounter: Payer: Self-pay | Admitting: *Deleted

## 2021-09-22 DIAGNOSIS — K7031 Alcoholic cirrhosis of liver with ascites: Secondary | ICD-10-CM

## 2021-09-22 DIAGNOSIS — Z9889 Other specified postprocedural states: Secondary | ICD-10-CM | POA: Diagnosis not present

## 2021-09-22 DIAGNOSIS — D649 Anemia, unspecified: Secondary | ICD-10-CM

## 2021-09-22 DIAGNOSIS — K766 Portal hypertension: Secondary | ICD-10-CM | POA: Diagnosis not present

## 2021-09-22 DIAGNOSIS — R918 Other nonspecific abnormal finding of lung field: Secondary | ICD-10-CM | POA: Diagnosis not present

## 2021-09-22 DIAGNOSIS — R160 Hepatomegaly, not elsewhere classified: Secondary | ICD-10-CM | POA: Diagnosis not present

## 2021-09-22 HISTORY — PX: IR RADIOLOGIST EVAL & MGMT: IMG5224

## 2021-09-22 NOTE — Telephone Encounter (Signed)
Spoke with patient's son [name] and scheduled a Televisit Palliative Consult for 10/04/21 @ 12:30PM with Dr. Hollace Kinnier. Documentation will be noted in [place].   Consent obtained; updated Outlook/Netsmart/Team List and Epic.

## 2021-09-22 NOTE — Progress Notes (Signed)
Reason for follow-up:  9 months status post TIPS  History of present illness: [name] is a 76 y.o. male with history of cirrhosis (Child Pugh A, MELD-Na 11) with associated hemorrhagic gastroesophageal varices in addition to history of hepatocellular carcinoma s/p TARE segmentectomy in November 2021.  He is now status post TIPS creation on 01/13/21.  Last IR clinic follow up was on 05/28/21.  At that visit, he was experiencing pancytopenia and anemia, with endoscopic evidence of multiple GI tract AVMs, and we considered TIPS check with possible further TIPS stent expansion (not balloon molded after deployment) should he have persistent portal hypertension.  Further imaging was never obtained due to loss of follow up, mostly secondary to multiple hospital admissions.  He has been admitted multiple times for encephalopathy, mostly due to non-compliance with lactulose.  He was admitted in early January for anemia, with Hb as low as 5.2.  He has responded to transfusion.    Unfortunately, he has been found to have a right lower lobe lung mass, highly suspicious for bronchogenic carcinoma.  He has established care with Pulmonology and is undergoing a PET scan in the next couple weeks.    He has remained compliant with lactulose and rifaximin lately.  He is currently on 40 mg furosemide and 50 mg spironolactone daily. No evidence of ascites.  He endorses melena which seems to be continuous.  No recent hematochezia or bright red blood per rectum.   Past Medical History:  Diagnosis Date   Arthritis    "joints tighten up sometimes" (03/27/2104)   Carcinoma of liver, hepatocellular ([place]) 06/30/2020   Chronic lower back pain    Coronary artery disease involving native coronary artery of native heart without angina pectoris    Severe left main disease at catheterization July 2015  CABG x3 with a LIMA to the LAD, SVG to the OM, SVG to the PDA on 03/31/14. EF 60% by cath.    Diastolic heart failure (HCC)     GERD without esophagitis 08/04/2010   Hepatic cirrhosis ([place])    a. Dx 01/2014 - CT a/p    History of blood transfusion    "related to bleeding ulcers"   History of concussion    1976--  NO RESIDUAL   History of GI bleed    a. UGIB 07/2012;  b. 01/2014 admission with GIB/FOB stool req 1U prbc's->EGD showed portal gastropathy, barrett's esoph, and chronic active h. pylori gastritis.   History of gout    2007 &  2008  LEFT LEG-- NO ISSUE SINCE   Hyperlipidemia    Iron deficiency anemia    Kidney stones    OA (osteoarthritis of spine)    LOWER BACK--  INTERMITTANT LEFT LEG NUMBNESS   OSA (obstructive sleep apnea)    PULMOLOGIST-  DR CLANCE--  MODERATE OSA  STARTED CPAP 2012--  BUT CURRENTLY HAS NOT USED PAST 6 MONTHS   Phimosis    a. s/p circumcision 2015.   Type 2 diabetes mellitus ([place])    Unspecified essential hypertension     Past Surgical History:  Procedure Laterality Date   ANKLE FRACTURE SURGERY Right 1989   "plate put in"   APPENDECTOMY  05-16-2004   open   BIOPSY  01/08/2021   Procedure: BIOPSY;  Surgeon: Wilford Corner, MD;  Location: [facility];  Service: Endoscopy;;   BIOPSY  05/02/2021   Procedure: BIOPSY;  Surgeon: Arta Silence, MD;  Location: [facility];  Service: Endoscopy;;   CIRCUMCISION N/A 09/09/2013  Procedure: CIRCUMCISION ADULT;  Surgeon: Bernestine Amass, MD;  [facility];  Service: Urology;  Laterality: N/A;   COLECTOMY  05-16-2004   COLONOSCOPY N/A 05/02/2021   Procedure: COLONOSCOPY;  Surgeon: Arta Silence, MD;  [facility];  Service: Endoscopy;  Laterality: N/A;   COLONOSCOPY WITH PROPOFOL N/A 11/20/2020   Procedure: COLONOSCOPY WITH PROPOFOL;  Surgeon: Ronald Lobo, MD;  [facility];  Service: Endoscopy;  Laterality: N/A;   CORONARY ANGIOPLASTY WITH STENT PLACEMENT  06/28/2012  DR COOPER   PCI W/  X1 DES to [place]. LAD/  LM  40% OSTIAL & 50-60% DISTAL /  50% PROX LCX/  30-40% PROX RCA & 50% MID  RCA/   LVEF 65-70%   CORONARY ARTERY BYPASS GRAFT N/A 03/31/2014   Procedure: CORONARY ARTERY BYPASS GRAFTING (CABG) times 3 using left internal mammary artery and right saphenous vein.;  Surgeon: Melrose Nakayama, MD;  Location: [place];  Service: Open Heart Surgery;  Laterality: N/A;   DOBUTAMINE STRESS ECHO  06-08-2012   MODERATE HYPOKINESIS/ ISCHEMIA MID INFERIOR WALL   ESOPHAGOGASTRODUODENOSCOPY  08/15/2012   Procedure: ESOPHAGOGASTRODUODENOSCOPY (EGD);  Surgeon: Wonda Horner, MD;  [facility];  Service: Endoscopy;  Laterality: N/A;   ESOPHAGOGASTRODUODENOSCOPY N/A 02/17/2014   Procedure: ESOPHAGOGASTRODUODENOSCOPY (EGD);  Surgeon: Jeryl Columbia, MD;  Location: [facility];  Service: Endoscopy;  Laterality: N/A;   ESOPHAGOGASTRODUODENOSCOPY N/A 04/17/2020   Procedure: ESOPHAGOGASTRODUODENOSCOPY (EGD);  Surgeon: Ronnette Juniper, MD;  Location: [place];  Service: Gastroenterology;  Laterality: N/A;   ESOPHAGOGASTRODUODENOSCOPY N/A 09/11/2020   Procedure: ESOPHAGOGASTRODUODENOSCOPY (EGD);  Surgeon: Clarene Essex, MD;  Location: [facility];  Service: Endoscopy;  Laterality: N/A;   ESOPHAGOGASTRODUODENOSCOPY  10/09/2020   ESOPHAGOGASTRODUODENOSCOPY N/A 10/09/2020   Procedure: ESOPHAGOGASTRODUODENOSCOPY (EGD);  Surgeon: Otis Brace, MD;  Location: [facility] ENDOSCOPY;  Service: Gastroenterology;  Laterality: N/A;   ESOPHAGOGASTRODUODENOSCOPY N/A 01/08/2021   Procedure: ESOPHAGOGASTRODUODENOSCOPY (EGD);  Surgeon: Wilford Corner, MD;  Location: [facility];  Service: Endoscopy;  Laterality: N/A;   ESOPHAGOGASTRODUODENOSCOPY N/A 05/02/2021   Procedure: ESOPHAGOGASTRODUODENOSCOPY (EGD);  Surgeon: Arta Silence, MD;  Location: [facility];  Service: Endoscopy;  Laterality: N/A;   ESOPHAGOGASTRODUODENOSCOPY (EGD) WITH PROPOFOL N/A 09/30/2017   Procedure: ESOPHAGOGASTRODUODENOSCOPY (EGD) WITH PROPOFOL;  Surgeon: Wonda Horner, MD;  Location: [facility] ENDOSCOPY;  Service: Endoscopy;  Laterality:  N/A;   ESOPHAGOGASTRODUODENOSCOPY (EGD) WITH PROPOFOL N/A 12/20/2017   Procedure: ESOPHAGOGASTRODUODENOSCOPY (EGD) WITH PROPOFOL;  Surgeon: Clarene Essex, MD;  Location: [place];  Service: Endoscopy;  Laterality: N/A;   ESOPHAGOGASTRODUODENOSCOPY (EGD) WITH PROPOFOL N/A 08/10/2020   Procedure: ESOPHAGOGASTRODUODENOSCOPY (EGD) WITH PROPOFOL;  Surgeon: Wilford Corner, MD;  Location: [place];  Service: Endoscopy;  Laterality: N/A;   ESOPHAGOGASTRODUODENOSCOPY (EGD) WITH PROPOFOL N/A 11/20/2020   Procedure: ESOPHAGOGASTRODUODENOSCOPY (EGD) WITH PROPOFOL;  Surgeon: Ronald Lobo, MD;  [facility];  Service: Endoscopy;  Laterality: N/A;   ESOPHAGOGASTRODUODENOSCOPY (EGD) WITH PROPOFOL N/A 02/28/2021   Procedure: ESOPHAGOGASTRODUODENOSCOPY (EGD) WITH PROPOFOL;  Surgeon: Clarene Essex, MD;  [facility];  Service: Endoscopy;  Laterality: N/A;   GIVENS CAPSULE STUDY N/A 11/20/2020   Procedure: GIVENS CAPSULE STUDY;  Surgeon: Ronald Lobo, MD;  [facility];  Service: Endoscopy;  Laterality: N/A;   HEMOSTASIS CLIP PLACEMENT  05/02/2021   Procedure: HEMOSTASIS CLIP PLACEMENT;  Surgeon: Arta Silence, MD;  [facility];  Service: Endoscopy;;   HERNIA REPAIR     INTRAOPERATIVE TRANSESOPHAGEAL ECHOCARDIOGRAM N/A 03/31/2014   Procedure: INTRAOPERATIVE TRANSESOPHAGEAL ECHOCARDIOGRAM;  Surgeon: Melrose Nakayama, MD;  Location:  [place] [facility];  Service: Open Heart Surgery;  Laterality: N/A;   IR ANGIOGRAM SELECTIVE EACH ADDITIONAL VESSEL  06/16/2020   IR ANGIOGRAM SELECTIVE EACH ADDITIONAL VESSEL  06/16/2020   IR ANGIOGRAM SELECTIVE EACH ADDITIONAL VESSEL  06/16/2020   IR ANGIOGRAM SELECTIVE EACH ADDITIONAL VESSEL  06/16/2020   IR ANGIOGRAM SELECTIVE EACH ADDITIONAL VESSEL  06/16/2020   IR ANGIOGRAM SELECTIVE EACH ADDITIONAL VESSEL  06/16/2020   IR ANGIOGRAM SELECTIVE EACH ADDITIONAL VESSEL  06/16/2020   IR ANGIOGRAM SELECTIVE EACH ADDITIONAL VESSEL  06/16/2020   IR  ANGIOGRAM SELECTIVE EACH ADDITIONAL VESSEL  06/16/2020   IR ANGIOGRAM SELECTIVE EACH ADDITIONAL VESSEL  06/30/2020   IR ANGIOGRAM SELECTIVE EACH ADDITIONAL VESSEL  06/30/2020   IR ANGIOGRAM VISCERAL SELECTIVE  06/16/2020   IR ANGIOGRAM VISCERAL SELECTIVE  06/30/2020   IR EMBO ARTERIAL NOT HEMORR HEMANG INC GUIDE ROADMAPPING  06/16/2020   IR EMBO TUMOR ORGAN ISCHEMIA INFARCT INC GUIDE ROADMAPPING  06/30/2020   IR EMBO VENOUS NOT HEMORR HEMANG  INC GUIDE ROADMAPPING  01/13/2021   IR IVUS EACH ADDITIONAL NON CORONARY VESSEL  01/13/2021   IR RADIOLOGIST EVAL & MGMT  05/06/2020   IR RADIOLOGIST EVAL & MGMT  05/20/2020   IR RADIOLOGIST EVAL & MGMT  05/25/2021   IR TIPS  01/13/2021   IR US GUIDE VASC ACCESS RIGHT  06/16/2020   IR US GUIDE VASC ACCESS RIGHT  06/30/2020   IR US GUIDE VASC ACCESS RIGHT  01/13/2021   LAPAROSCOPIC UMBILICAL HERNIA REPAIR W/ MESH  06-06-2011   LEFT HEART CATHETERIZATION WITH CORONARY ANGIOGRAM N/A 03/28/2014   Procedure: LEFT HEART CATHETERIZATION WITH CORONARY ANGIOGRAM;  Surgeon: Sinclair Grooms, MD;  Location: [facility] CATH LAB;  Service: Cardiovascular;  Laterality: N/A;   LIPOMA EXCISION Left 08/25/2017   Procedure: EXCISION LIPOMA LEFT POSTERIOR THIGH;  Surgeon: Clovis Riley, MD;  Location: [place];  Service: General;  Laterality: Left;   NEPHROLITHOTOMY  1990'S   OPEN APPENDECTOMY W/ PARTIAL CECECTOMY  05-16-2004   PERCUTANEOUS CORONARY STENT INTERVENTION (PCI-S) N/A 06/28/2012   Procedure: PERCUTANEOUS CORONARY STENT INTERVENTION (PCI-S);  Surgeon: Sherren Mocha, MD;  Location: [facility] CATH LAB;  Service: Cardiovascular;  Laterality: N/A;   POLYPECTOMY  11/20/2020   Procedure: POLYPECTOMY;  Surgeon: Ronald Lobo, MD;  [facility];  Service: Endoscopy;;   POLYPECTOMY  05/02/2021   Procedure: POLYPECTOMY;  Surgeon: Arta Silence, MD;  [facility];  Service: Endoscopy;;   RADIOLOGY WITH ANESTHESIA N/A 01/13/2021   Procedure: IR WITH ANESTHESIA - TIPS;   Surgeon: Suzette Battiest, MD;  Location: [place];  Service: Radiology;  Laterality: N/A;    Allergies: Fluzone quadrivalent [influenza vac split quad]  Medications: Prior to Admission medications   Medication Sig Start Date End Date Taking? Authorizing Provider  Accu-Chek Softclix Lancets lancets USE AS DIRECTED TO TEST BLOOD SUGAR FOUR TIMES DAILY 03/13/20   [name], MD  albuterol (VENTOLIN HFA) 108 (90 Base) MCG/ACT inhaler Inhale 2 puffs into the lungs every 6 (six) hours as needed for wheezing or shortness of breath. Patient not taking: Reported on 09/16/2021 03/01/21   Aline August, MD  [facility] ELLIPTA 62.5-25 MCG/ACT AEPB Inhale 1 puff into the lungs daily. Patient not taking: Reported on 09/16/2021 07/25/21   Mercy Riding, MD  blood glucose meter kit and supplies Dispense based on patient and insurance preference. Use up to four times daily as directed. (FOR ICD-10 E10.9, E11.9). 03/12/20   [name], MD  ferrous sulfate 325 (  65 FE) MG tablet Take 1 tablet (325 mg total) by mouth daily with breakfast. 05/06/21 09/16/22  Dahal, Marlowe Aschoff, MD  furosemide (LASIX) 40 MG tablet Take 1 tablet (40 mg total) by mouth daily. 07/25/21 10/23/21  Mercy Riding, MD  lactulose (CHRONULAC) 10 GM/15ML solution Take 45 mLs (30 g total) by mouth 3 (three) times daily. 09/07/21   [name], MD  pantoprazole (PROTONIX) 40 MG tablet Take 1 tablet (40 mg total) by mouth 2 (two) times daily before a meal. 06/18/21   Ghimire, Henreitta Leber, MD  rifaximin (XIFAXAN) 550 MG TABS tablet Take 1 tablet (550 mg total) by mouth 2 (two) times daily. 09/12/21   Aline August, MD  spironolactone (ALDACTONE) 50 MG tablet Take 1 tablet (50 mg total) by mouth daily. 07/25/21   Mercy Riding, MD     Family History  Problem Relation Age of Onset   Lung cancer Sister    Cancer Sister        lung   Cancer Mother    Cancer Father        died in his 70s.   Cancer Brother        lung   Coronary artery  disease Other    Diabetes Other    Colon cancer Other    Cancer Sister        lung    Social History   Socioeconomic History   Marital status: Married    Spouse name: Not on file   Number of children: Not on file   Years of education: Not on file   Highest education level: Not on file  Occupational History   Not on file  Tobacco Use   Smoking status: Former    Packs/day: 2.00    Years: 40.00    Pack years: 80.00    Types: Cigarettes    Quit date: 08/29/1996    Years since quitting: 25.0   Smokeless tobacco: Never  Vaping Use   Vaping Use: Never used  Substance and Sexual Activity   Alcohol use: Not Currently    Alcohol/week: 8.0 standard drinks    Types: 8 Cans of beer per week    Comment: 2 beers every other day   Drug use: No   Sexual activity: Yes  Other Topics Concern   Not on file  Social History Narrative   Lives in [place] with wife, son, and son's family.   Social Determinants of Health   Financial Resource Strain: Medium Risk   Difficulty of Paying Living Expenses: Somewhat hard  Food Insecurity: No Food Insecurity   Worried About Charity fundraiser in the Last Year: Never true   Ran Out of Food in the Last Year: Never true  Transportation Needs: No Transportation Needs   Lack of Transportation (Medical): No   Lack of Transportation (Non-Medical): No  Physical Activity: Not on file  Stress: Not on file  Social Connections: Not on file     Vital Signs: There were no vitals taken for this visit.  Physical Exam Constitutional:      General: He is not in acute distress. HENT:     Head: Normocephalic.  Eyes:     General: No scleral icterus. Cardiovascular:     Heart sounds: Normal heart sounds.  Pulmonary:     Breath sounds: Normal breath sounds.  Abdominal:     General: There is no distension.  Musculoskeletal:        General: No swelling.  Skin:    General: Skin is warm and dry.     Findings: Bruising present.  Neurological:     Mental  Status: He is alert and oriented to person, place, and time.    Imaging: [place] TIPS 09/22/21: Patent TIPS with increased central stent flow velocities.  CTA AP 09/10/21 IMPRESSION: VASCULAR 1. Patent TIPS. A few small esophageal varices remain visible, decreased from pre-TIPS comparison. No evidence of definite recanalization of the previously embolized gastric varices. No evidence of development of ectopic varix. 2. Similar appearing ostial occlusion versus high-grade stenosis of the single right renal artery with associated right renal atrophy. 3. Aortic Atherosclerosis (ICD10-I70.0).  NON-VASCULAR 1. Similar appearing rounded masslike opacity in the right lower lobe which measures up to 4.1 cm and remains suspicious for primary bronchogenic neoplasm. 2. Similar appearing changes of cirrhosis with unchanged appearance of previously treated (radiation segmentectomy) right lobe hepatocellular carcinoma (LI-RADS TR-nonviable). No new hepatoma. 3. Similar appearing nonobstructive right nephrolithiasis.    TIPS 01/13/21  8+2 Viatorr, no post-deployment balloon dilation.  Post-TIPS portosystemic gradient = 5 mmHg    Labs:  CBC: Recent Labs    09/10/21 1654 09/11/21 0929 09/12/21 0822 09/16/21 0055 09/16/21 0105  WBC 4.3 3.6* 3.4* 3.8*  --   HGB 7.6* 8.9* 7.4* 9.0* 9.9*  HCT 24.1* 28.5* 24.3* 28.5* 29.0*  PLT PLATELET CLUMPS NOTED ON SMEAR, UNABLE TO ESTIMATE PLATELET CLUMPS NOTED ON SMEAR, UNABLE TO ESTIMATE 93* 120*  --     COAGS: Recent Labs    01/22/21 1558 01/29/21 0445 06/15/21 1500 07/23/21 1649 07/24/21 0216 09/09/21 0309  INR 1.3*   < > 1.3* 1.3* 1.3* 1.3*  APTT 34  --   --   --   --   --    < > = values in this interval not displayed.    BMP: Recent Labs    09/10/21 0431 09/11/21 0315 09/12/21 0822 09/16/21 0055 09/16/21 0105  NA 137 136 139 131* 134*  K 3.3* 3.6 3.9 4.1 4.4  CL 109 106 110 101 101  CO2 18* 20* 21* 23  --   GLUCOSE 166* 97 91  112* 110*  BUN '20 15 13 15 13  ' CALCIUM 8.0* 8.2* 7.9* 8.4*  --   CREATININE 1.40* 1.18 1.07 0.97 1.00  GFRNONAA 52* >60 >60 >60  --     LIVER FUNCTION TESTS: Recent Labs    09/10/21 0431 09/11/21 0315 09/12/21 0822 09/16/21 0055  BILITOT 1.3* 1.3* 1.1 1.4*  AST 35 31 29 43*  ALT '22 21 20 26  ' ALKPHOS 117 115 120 156*  PROT 5.2* 5.5* 5.1* 6.2*  ALBUMIN 2.2* 2.5* 2.2* 2.9*    Assessment and Plan: 76 year old male with history of cirrhosis (Child Pugh A, MELD-Na 11) with associated hemorrhagic gastroesophageal varices in addition to history of hepatocellular carcinoma s/p TARE segmentectomy in November 2021.  He is now status post TIPS creation on 01/13/21. He has continued to suffer from pancytopenia and anemia, with some evidence of GI tract etiology of blood loss.  Given extensive gastroesophageal varices and minimal TIPS stent dilation after placement, there maybe future room for further decompression of the portal system.  Given additional comorbidities, especially facing possibly a new diagnosis of lung cancer, decreasing his risks of anemia should be prioritized.    -agree with continued iron supplementation as recommended by Dr. [name] (GI) -recommend TIPS check with portosystemic manometry and possible balloon expansion of TIPS, possible variceal embolization (to be arranged  for outpatient procedure at [facility])   Electronically Signed: Rosanne Ashing Dmari Schubring 09/22/2021, 10:02 AM   I spent a total of 40 Minutes in face to face in clinical consultation, greater than 50% of which was counseling/coordinating care for portal hypertension.

## 2021-09-23 DIAGNOSIS — I110 Hypertensive heart disease with heart failure: Secondary | ICD-10-CM | POA: Diagnosis not present

## 2021-09-23 DIAGNOSIS — Z8505 Personal history of malignant neoplasm of liver: Secondary | ICD-10-CM | POA: Diagnosis not present

## 2021-09-23 DIAGNOSIS — E119 Type 2 diabetes mellitus without complications: Secondary | ICD-10-CM | POA: Diagnosis not present

## 2021-09-23 DIAGNOSIS — M1000 Idiopathic gout, unspecified site: Secondary | ICD-10-CM | POA: Diagnosis not present

## 2021-09-23 DIAGNOSIS — I2510 Atherosclerotic heart disease of native coronary artery without angina pectoris: Secondary | ICD-10-CM | POA: Diagnosis not present

## 2021-09-23 DIAGNOSIS — N471 Phimosis: Secondary | ICD-10-CM | POA: Diagnosis not present

## 2021-09-23 DIAGNOSIS — I5032 Chronic diastolic (congestive) heart failure: Secondary | ICD-10-CM | POA: Diagnosis not present

## 2021-09-23 DIAGNOSIS — D509 Iron deficiency anemia, unspecified: Secondary | ICD-10-CM | POA: Diagnosis not present

## 2021-09-23 DIAGNOSIS — T40711D Poisoning by cannabis, accidental (unintentional), subsequent encounter: Secondary | ICD-10-CM | POA: Diagnosis not present

## 2021-09-23 DIAGNOSIS — Z7951 Long term (current) use of inhaled steroids: Secondary | ICD-10-CM | POA: Diagnosis not present

## 2021-09-23 DIAGNOSIS — I700 Atherosclerosis of aorta: Secondary | ICD-10-CM | POA: Diagnosis not present

## 2021-09-23 DIAGNOSIS — D61811 Other drug-induced pancytopenia: Secondary | ICD-10-CM | POA: Diagnosis not present

## 2021-09-23 DIAGNOSIS — I864 Gastric varices: Secondary | ICD-10-CM | POA: Diagnosis not present

## 2021-09-23 DIAGNOSIS — K279 Peptic ulcer, site unspecified, unspecified as acute or chronic, without hemorrhage or perforation: Secondary | ICD-10-CM | POA: Diagnosis not present

## 2021-09-23 DIAGNOSIS — G928 Other toxic encephalopathy: Secondary | ICD-10-CM | POA: Diagnosis not present

## 2021-09-23 DIAGNOSIS — E785 Hyperlipidemia, unspecified: Secondary | ICD-10-CM | POA: Diagnosis not present

## 2021-09-23 DIAGNOSIS — G4733 Obstructive sleep apnea (adult) (pediatric): Secondary | ICD-10-CM | POA: Diagnosis not present

## 2021-09-23 DIAGNOSIS — Z951 Presence of aortocoronary bypass graft: Secondary | ICD-10-CM | POA: Diagnosis not present

## 2021-09-23 DIAGNOSIS — M545 Low back pain, unspecified: Secondary | ICD-10-CM | POA: Diagnosis not present

## 2021-09-23 DIAGNOSIS — M1990 Unspecified osteoarthritis, unspecified site: Secondary | ICD-10-CM | POA: Diagnosis not present

## 2021-09-23 DIAGNOSIS — K745 Biliary cirrhosis, unspecified: Secondary | ICD-10-CM | POA: Diagnosis not present

## 2021-09-23 DIAGNOSIS — R188 Other ascites: Secondary | ICD-10-CM | POA: Diagnosis not present

## 2021-09-23 DIAGNOSIS — R69 Illness, unspecified: Secondary | ICD-10-CM | POA: Diagnosis not present

## 2021-09-23 DIAGNOSIS — K766 Portal hypertension: Secondary | ICD-10-CM | POA: Diagnosis not present

## 2021-09-23 DIAGNOSIS — K219 Gastro-esophageal reflux disease without esophagitis: Secondary | ICD-10-CM | POA: Diagnosis not present

## 2021-09-23 NOTE — Telephone Encounter (Signed)
See below

## 2021-09-23 NOTE — Telephone Encounter (Signed)
Amy called and wants to know will pt be able to monitor his own blood sugar? Or will the doctor manage in the office? The pt has not been checking it, because he doesn't have equipment. I see where the plan of care is okay, but how should it be monitored?    Please advise.  Update Amy ASAP.

## 2021-09-24 ENCOUNTER — Telehealth: Payer: Self-pay | Admitting: Internal Medicine

## 2021-09-24 NOTE — Telephone Encounter (Signed)
Home Health verbal orders-caller/Agency: [facility]  Callback number: [phone] (secure vm)  Requesting OT/PT/Skilled nursing/Social Work/Speech: Skilled Nursing  Start Date: 09-23-2021  Frequency: 1w6   Rep requesting rx for a glucometer

## 2021-09-24 NOTE — Telephone Encounter (Signed)
We cannot approve without visit

## 2021-09-24 NOTE — Telephone Encounter (Signed)
Can we clarify these messages and find out what the request is? The first is asking about blood pressure and the second is asking about blood sugar? He needs visit with me to address.

## 2021-09-27 ENCOUNTER — Ambulatory Visit (INDEPENDENT_AMBULATORY_CARE_PROVIDER_SITE_OTHER): Payer: Medicare HMO | Admitting: Internal Medicine

## 2021-09-27 ENCOUNTER — Other Ambulatory Visit: Payer: Self-pay

## 2021-09-27 ENCOUNTER — Encounter: Payer: Self-pay | Admitting: Internal Medicine

## 2021-09-27 VITALS — BP 132/72 | HR 75 | Resp 18 | Ht 73.0 in | Wt 172.8 lb

## 2021-09-27 DIAGNOSIS — I5032 Chronic diastolic (congestive) heart failure: Secondary | ICD-10-CM

## 2021-09-27 DIAGNOSIS — C3491 Malignant neoplasm of unspecified part of right bronchus or lung: Secondary | ICD-10-CM | POA: Diagnosis not present

## 2021-09-27 DIAGNOSIS — E118 Type 2 diabetes mellitus with unspecified complications: Secondary | ICD-10-CM

## 2021-09-27 DIAGNOSIS — K7030 Alcoholic cirrhosis of liver without ascites: Secondary | ICD-10-CM

## 2021-09-27 DIAGNOSIS — D500 Iron deficiency anemia secondary to blood loss (chronic): Secondary | ICD-10-CM

## 2021-09-27 DIAGNOSIS — R69 Illness, unspecified: Secondary | ICD-10-CM | POA: Diagnosis not present

## 2021-09-27 DIAGNOSIS — E876 Hypokalemia: Secondary | ICD-10-CM

## 2021-09-27 DIAGNOSIS — D649 Anemia, unspecified: Secondary | ICD-10-CM

## 2021-09-27 DIAGNOSIS — D696 Thrombocytopenia, unspecified: Secondary | ICD-10-CM | POA: Diagnosis not present

## 2021-09-27 LAB — COMPREHENSIVE METABOLIC PANEL
ALT: 28 U/L (ref 0–53)
AST: 44 U/L — ABNORMAL HIGH (ref 0–37)
Albumin: 2.7 g/dL — ABNORMAL LOW (ref 3.5–5.2)
Alkaline Phosphatase: 267 U/L — ABNORMAL HIGH (ref 39–117)
BUN: 17 mg/dL (ref 6–23)
CO2: 28 mEq/L (ref 19–32)
Calcium: 8.7 mg/dL (ref 8.4–10.5)
Chloride: 98 mEq/L (ref 96–112)
Creatinine, Ser: 1.07 mg/dL (ref 0.40–1.50)
GFR: 67.86 mL/min (ref >60.00)
Glucose, Bld: 164 mg/dL — ABNORMAL HIGH (ref 70–99)
Potassium: 4.2 mEq/L (ref 3.5–5.1)
Sodium: 132 mEq/L — ABNORMAL LOW (ref 135–145)
Total Bilirubin: 1 mg/dL (ref 0.2–1.2)
Total Protein: 6.3 g/dL (ref 6.0–8.3)

## 2021-09-27 LAB — CBC
HCT: 23.8 % — CL (ref 39.0–52.0)
Hemoglobin: 7.6 g/dL — CL (ref 13.0–17.0)
MCHC: 32 g/dL (ref 30.0–36.0)
MCV: 81.6 fl (ref 78.0–100.0)
Platelets: 159 10*3/uL (ref 150.0–400.0)
RBC: 2.92 Mil/uL — ABNORMAL LOW (ref 4.22–5.81)
RDW: 19.4 % — ABNORMAL HIGH (ref 11.5–15.5)
WBC: 3.6 10*3/uL — ABNORMAL LOW (ref 4.0–10.5)

## 2021-09-27 NOTE — Patient Instructions (Addendum)
We will check the blood work today.

## 2021-09-27 NOTE — Progress Notes (Signed)
° °  Subjective:   Patient ID: [name], male    DOB: 1946-05-30, 76 y.o.   MRN: [id number]  HPI The patient is a 76 YO man coming in for follow up. Multiple hospital stays since last visit related to GI hemorrhage, cirrhosis, congestive heart failure. He does have a challenging social situation. Wife is present with him today. He admits to no BM in 2 days or so, states he is taking medications as prescribed.   PMH, [facility], social history reviewed and updated  Review of Systems  Constitutional:  Positive for activity change, appetite change and fatigue.  HENT: Negative.    Eyes: Negative.   Respiratory:  Negative for cough, chest tightness and shortness of breath.   Cardiovascular:  Negative for chest pain, palpitations and leg swelling.  Gastrointestinal:  Positive for constipation. Negative for abdominal distention, abdominal pain, diarrhea, nausea and vomiting.  Musculoskeletal:  Positive for arthralgias and myalgias.  Skin: Negative.   Neurological: Negative.   Psychiatric/Behavioral:  Positive for decreased concentration and sleep disturbance.    Objective:  Physical Exam Constitutional:      Appearance: He is well-developed.     Comments: Appears chronically ill  HENT:     Head: Normocephalic and atraumatic.  Cardiovascular:     Rate and Rhythm: Normal rate and regular rhythm.  Pulmonary:     Effort: Pulmonary effort is normal. No respiratory distress.     Breath sounds: Normal breath sounds. No wheezing or rales.     Comments: Bruising from thoracentesis on right flank and upper abdomen and tender to touch Chest:     Chest wall: Tenderness present.  Abdominal:     General: Bowel sounds are normal. There is no distension.     Palpations: Abdomen is soft.     Tenderness: There is no abdominal tenderness. There is no rebound.  Musculoskeletal:     Cervical back: Normal range of motion.  Skin:    General: Skin is warm and dry.  Neurological:     Mental Status: He is  alert and oriented to person, place, and time.     Coordination: Coordination normal.    Vitals:   09/27/21 1111  BP: 132/72  Pulse: 75  Resp: 18  SpO2: 98%  Weight: 172 lb 12.8 oz (78.4 kg)  Height: 6\' 1"  (1.854 m)    This visit occurred during the SARS-CoV-2 public health emergency.  Safety protocols were in place, including screening questions prior to the visit, additional usage of staff PPE, and extensive cleaning of exam room while observing appropriate contact time as indicated for disinfecting solutions.   Assessment & Plan:  Visit time 25 minutes in face to face communication with patient and coordination of care, additional 25 minutes spent in record review, coordination or care, ordering tests, communicating/referring to other healthcare professionals, documenting in medical records all on the same day of the visit for total time 50 minutes spent on the visit.

## 2021-09-28 ENCOUNTER — Encounter: Payer: Self-pay | Admitting: *Deleted

## 2021-09-28 DIAGNOSIS — I700 Atherosclerosis of aorta: Secondary | ICD-10-CM | POA: Diagnosis not present

## 2021-09-28 DIAGNOSIS — D61811 Other drug-induced pancytopenia: Secondary | ICD-10-CM | POA: Diagnosis not present

## 2021-09-28 DIAGNOSIS — K219 Gastro-esophageal reflux disease without esophagitis: Secondary | ICD-10-CM | POA: Diagnosis not present

## 2021-09-28 DIAGNOSIS — I2510 Atherosclerotic heart disease of native coronary artery without angina pectoris: Secondary | ICD-10-CM | POA: Diagnosis not present

## 2021-09-28 DIAGNOSIS — M545 Low back pain, unspecified: Secondary | ICD-10-CM | POA: Diagnosis not present

## 2021-09-28 DIAGNOSIS — D509 Iron deficiency anemia, unspecified: Secondary | ICD-10-CM | POA: Diagnosis not present

## 2021-09-28 DIAGNOSIS — E785 Hyperlipidemia, unspecified: Secondary | ICD-10-CM | POA: Diagnosis not present

## 2021-09-28 DIAGNOSIS — K279 Peptic ulcer, site unspecified, unspecified as acute or chronic, without hemorrhage or perforation: Secondary | ICD-10-CM | POA: Diagnosis not present

## 2021-09-28 DIAGNOSIS — G928 Other toxic encephalopathy: Secondary | ICD-10-CM | POA: Diagnosis not present

## 2021-09-28 DIAGNOSIS — E119 Type 2 diabetes mellitus without complications: Secondary | ICD-10-CM | POA: Diagnosis not present

## 2021-09-28 DIAGNOSIS — I5032 Chronic diastolic (congestive) heart failure: Secondary | ICD-10-CM | POA: Diagnosis not present

## 2021-09-28 DIAGNOSIS — R69 Illness, unspecified: Secondary | ICD-10-CM | POA: Diagnosis not present

## 2021-09-28 DIAGNOSIS — K745 Biliary cirrhosis, unspecified: Secondary | ICD-10-CM | POA: Diagnosis not present

## 2021-09-28 DIAGNOSIS — N471 Phimosis: Secondary | ICD-10-CM | POA: Diagnosis not present

## 2021-09-28 DIAGNOSIS — T40711D Poisoning by cannabis, accidental (unintentional), subsequent encounter: Secondary | ICD-10-CM | POA: Diagnosis not present

## 2021-09-28 DIAGNOSIS — M1000 Idiopathic gout, unspecified site: Secondary | ICD-10-CM | POA: Diagnosis not present

## 2021-09-28 DIAGNOSIS — C3491 Malignant neoplasm of unspecified part of right bronchus or lung: Secondary | ICD-10-CM | POA: Insufficient documentation

## 2021-09-28 DIAGNOSIS — K766 Portal hypertension: Secondary | ICD-10-CM | POA: Diagnosis not present

## 2021-09-28 DIAGNOSIS — Z8505 Personal history of malignant neoplasm of liver: Secondary | ICD-10-CM | POA: Diagnosis not present

## 2021-09-28 DIAGNOSIS — Z951 Presence of aortocoronary bypass graft: Secondary | ICD-10-CM | POA: Diagnosis not present

## 2021-09-28 DIAGNOSIS — Z7951 Long term (current) use of inhaled steroids: Secondary | ICD-10-CM | POA: Diagnosis not present

## 2021-09-28 DIAGNOSIS — M1990 Unspecified osteoarthritis, unspecified site: Secondary | ICD-10-CM | POA: Diagnosis not present

## 2021-09-28 DIAGNOSIS — G4733 Obstructive sleep apnea (adult) (pediatric): Secondary | ICD-10-CM | POA: Diagnosis not present

## 2021-09-28 DIAGNOSIS — R188 Other ascites: Secondary | ICD-10-CM | POA: Diagnosis not present

## 2021-09-28 DIAGNOSIS — I864 Gastric varices: Secondary | ICD-10-CM | POA: Diagnosis not present

## 2021-09-28 DIAGNOSIS — I110 Hypertensive heart disease with heart failure: Secondary | ICD-10-CM | POA: Diagnosis not present

## 2021-09-28 NOTE — Assessment & Plan Note (Signed)
Checking CBC today. Previously 9 in the hospital and without bleeding since being home. Constipated for last 2 days. Taking protonix BID. Adjust as needed.

## 2021-09-28 NOTE — Assessment & Plan Note (Signed)
Newly diagnosed with CT and thoracentesis in a recent hospital stay (not most recent). Reviewed results of cytology which is consistent with cancer in the right lung with patient and wife. They are scheduled for PET scan next week through pulmonary. Tried to engage in goals of care conversation however they were not amenable to that today and would like repeat conversation once PET scan is back.

## 2021-09-28 NOTE — Assessment & Plan Note (Signed)
Checking CBC today. Overall was stable during most recent stay but he does have recurrent chronic bleeding and recurrent low Hg levels.

## 2021-09-28 NOTE — Progress Notes (Signed)
Oncology Nurse Navigator Documentation  Oncology Nurse Navigator Flowsheets 09/28/2021  Navigator Follow Up Date: 09/29/2021  Navigator Follow Up Reason: Review Note  Navigator Location [facility]  Navigator Encounter Type Telephone  Telephone Outgoing Call  Treatment Phase Other  Barriers/Navigation Needs Education;Coordination of Care/I received a message from Dr. Chryl Heck that patient was a no show and how I needed to address it. I called patient and spoke to his son. I asked if patient needed any help with transportation or navigating his care here. He explained that he gets a lot of phone calls form non-important places and he it becomes overwhelming. I listened as he explained. He is aware patient has an appt to be seen tomorrow with Dr. Chryl Heck. Will follow up.   Education Other  Interventions Coordination of Care;Education;Psycho-Social Support  Acuity Level 2-Minimal Needs (1-2 Barriers Identified)  Coordination of Care Other  Education Method Verbal  Time Spent with Patient 15

## 2021-09-28 NOTE — Assessment & Plan Note (Signed)
No BM in 2 days and advised to dose lactulose to BMs. He is also taking xifaxan to prevent encephalopathy and there are no signs of that today. Checking CMP and CBC.

## 2021-09-28 NOTE — Assessment & Plan Note (Signed)
With his weight loss and muscle mass loss due to chronic illness his HgA1c is good off medications and let them know that monitoring low sodium diet is likely more important to his health than sugar/carbs.

## 2021-09-28 NOTE — Assessment & Plan Note (Signed)
Stable due to cirrhosis. Checking CBC today.

## 2021-09-28 NOTE — Assessment & Plan Note (Signed)
No flare today. Taking lasix 40 mg daily and spironolactone 50 mg daily. Checking CMP and CBC.

## 2021-09-28 NOTE — Assessment & Plan Note (Signed)
Checking CMP for stability.  

## 2021-09-29 ENCOUNTER — Inpatient Hospital Stay: Payer: Medicare HMO | Attending: Hematology and Oncology | Admitting: Hematology and Oncology

## 2021-09-29 ENCOUNTER — Telehealth: Payer: Self-pay | Admitting: Internal Medicine

## 2021-09-29 ENCOUNTER — Other Ambulatory Visit: Payer: Self-pay

## 2021-09-29 ENCOUNTER — Inpatient Hospital Stay: Payer: Medicare HMO

## 2021-09-29 ENCOUNTER — Other Ambulatory Visit: Payer: Self-pay | Admitting: *Deleted

## 2021-09-29 ENCOUNTER — Encounter: Payer: Self-pay | Admitting: *Deleted

## 2021-09-29 ENCOUNTER — Encounter: Payer: Self-pay | Admitting: Hematology and Oncology

## 2021-09-29 VITALS — BP 145/45 | HR 78 | Temp 97.5°F | Resp 16 | Ht 73.0 in | Wt 170.2 lb

## 2021-09-29 DIAGNOSIS — Z87891 Personal history of nicotine dependence: Secondary | ICD-10-CM | POA: Insufficient documentation

## 2021-09-29 DIAGNOSIS — D61818 Other pancytopenia: Secondary | ICD-10-CM | POA: Insufficient documentation

## 2021-09-29 DIAGNOSIS — C221 Intrahepatic bile duct carcinoma: Secondary | ICD-10-CM | POA: Insufficient documentation

## 2021-09-29 DIAGNOSIS — D649 Anemia, unspecified: Secondary | ICD-10-CM

## 2021-09-29 DIAGNOSIS — J910 Malignant pleural effusion: Secondary | ICD-10-CM | POA: Insufficient documentation

## 2021-09-29 DIAGNOSIS — K7460 Unspecified cirrhosis of liver: Secondary | ICD-10-CM | POA: Insufficient documentation

## 2021-09-29 DIAGNOSIS — R918 Other nonspecific abnormal finding of lung field: Secondary | ICD-10-CM | POA: Diagnosis not present

## 2021-09-29 DIAGNOSIS — R188 Other ascites: Secondary | ICD-10-CM

## 2021-09-29 DIAGNOSIS — C3491 Malignant neoplasm of unspecified part of right bronchus or lung: Secondary | ICD-10-CM

## 2021-09-29 DIAGNOSIS — D696 Thrombocytopenia, unspecified: Secondary | ICD-10-CM

## 2021-09-29 LAB — CBC WITH DIFFERENTIAL (CANCER CENTER ONLY)
Abs Immature Granulocytes: 0.01 10*3/uL (ref 0.00–0.07)
Basophils Absolute: 0 10*3/uL (ref 0.0–0.1)
Basophils Relative: 0 %
Eosinophils Absolute: 0.1 10*3/uL (ref 0.0–0.5)
Eosinophils Relative: 2 %
HCT: 23.7 % — ABNORMAL LOW (ref 39.0–52.0)
Hemoglobin: 7.4 g/dL — ABNORMAL LOW (ref 13.0–17.0)
Immature Granulocytes: 0 %
Lymphocytes Relative: 13 %
Lymphs Abs: 0.4 10*3/uL — ABNORMAL LOW (ref 0.7–4.0)
MCH: 25.4 pg — ABNORMAL LOW (ref 26.0–34.0)
MCHC: 31.2 g/dL (ref 30.0–36.0)
MCV: 81.4 fL (ref 80.0–100.0)
Monocytes Absolute: 0.4 10*3/uL (ref 0.1–1.0)
Monocytes Relative: 13 %
Neutro Abs: 2.3 10*3/uL (ref 1.7–7.7)
Neutrophils Relative %: 72 %
Platelet Count: 160 10*3/uL (ref 150–400)
RBC: 2.91 MIL/uL — ABNORMAL LOW (ref 4.22–5.81)
RDW: 18.3 % — ABNORMAL HIGH (ref 11.5–15.5)
WBC Count: 3.2 10*3/uL — ABNORMAL LOW (ref 4.0–10.5)
nRBC: 0 % (ref 0.0–0.2)

## 2021-09-29 LAB — SAMPLE TO BLOOD BANK

## 2021-09-29 NOTE — Progress Notes (Signed)
Oncology Nurse Navigator Documentation  Oncology Nurse Navigator Flowsheets 09/29/2021 09/28/2021  Navigator Follow Up Date: 10/06/2021 09/29/2021  Navigator Follow Up Reason: Pathology Review Note  Navigator Location [facility]  Navigator Encounter Type Other: Telephone  Telephone - Outgoing Call  Patient Visit Type Other -  Treatment Phase Other;Pre-Tx/Tx Discussion Other  Barriers/Navigation Needs Coordination of Care/per Dr. Chryl Heck, I notified pathology to send molecular and PDL 1 testing to [facility].  Education;Coordination of Care  Education - Other  Interventions Coordination of Care Coordination of Care;Education;Psycho-Social Support  Acuity Level 2-Minimal Needs (1-2 Barriers Identified) Level 2-Minimal Needs (1-2 Barriers Identified)  Coordination of Care Pathology Other  Education Method - Verbal  Time Spent with Patient 30 15

## 2021-09-29 NOTE — Progress Notes (Signed)
[place]  PROGRESS NOTE  Patient Care Team: [name], MD as PCP - General (Internal Medicine) Nahser, Wonda Cheng, MD as PCP - Cardiology (Cardiology) Clance, Armando Reichert, MD as Consulting Physician (Pulmonary Disease) Greer Pickerel, MD as Consulting Physician (General Surgery) Wonda Horner, MD (Gastroenterology) Rana Snare, MD (Inactive) (Urology) Hayden Pedro, MD (Ophthalmology) Valrie Hart, RN as Oncology Nurse Navigator (Oncology)  CHIEF COMPLAINTS/PURPOSE OF CONSULTATION:  Lung mass follow up  HISTORY OF PRESENTING ILLNESS:   [name] 76 y.o. male is here because of lung mass.  Patient is here for follow-up with his son. Patient had a chest x-ray when he presented with confusion weakness on September 08, 2021 and this showed cardiomegaly with probable mild to moderate pulmonary interstitial edema no focal consolidation or significant pleural effusion.  CT chest showed round masslike consolidation in the posterior right lower lobe new since June 2022 in light of the new mediastinal lymphadenopathy and patient smoking history findings are concerning for primary bronchogenic neoplasm.  Small to moderate right and trace left pleural effusions were also noted with mild to moderate interstitial pulmonary edema. He had cytology from the right pleural effusion which showed malignant cells consistent with adenocarcinoma PET/CT scheduled for February 6. I have known this patient from the past, he used to follow-up with [place] because of his cirrhosis and pancytopenia.  He is not at a good functional status at baseline.  Even today he is sleepy, could not describe to me the situation.  I do not believe he understands the extent of the problem.  Son who was with him told me that he will try to talk to him once they get home. Patient does not describe any worsening shortness of breath or cough.  He only describes easy bruising since the procedures. He also has  diffuse bilateral lower extremity swelling.  Rest of the pertinent 10 point ROS reviewed and negative.  MEDICAL HISTORY:  Past Medical History:  Diagnosis Date   Arthritis    "joints tighten up sometimes" (03/27/2104)   Carcinoma of liver, hepatocellular ([place]) 06/30/2020   Chronic lower back pain    Coronary artery disease involving native coronary artery of native heart without angina pectoris    Severe left main disease at catheterization July 2015  CABG x3 with a LIMA to the LAD, SVG to the OM, SVG to the PDA on 03/31/14. EF 60% by cath.    Diastolic heart failure (HCC)    GERD without esophagitis 08/04/2010   Hepatic cirrhosis ([place])    a. Dx 01/2014 - CT a/p    History of blood transfusion    "related to bleeding ulcers"   History of concussion    1976--  NO RESIDUAL   History of GI bleed    a. UGIB 07/2012;  b. 01/2014 admission with GIB/FOB stool req 1U prbc's->EGD showed portal gastropathy, barrett's esoph, and chronic active h. pylori gastritis.   History of gout    2007 &  2008  LEFT LEG-- NO ISSUE SINCE   Hyperlipidemia    Iron deficiency anemia    Kidney stones    OA (osteoarthritis of spine)    LOWER BACK--  INTERMITTANT LEFT LEG NUMBNESS   OSA (obstructive sleep apnea)    PULMOLOGIST-  DR CLANCE--  MODERATE OSA  STARTED CPAP 2012--  BUT CURRENTLY HAS NOT USED PAST 6 MONTHS   Phimosis    a. s/p circumcision 2015.   Type 2 diabetes mellitus (  HCC)   ° Unspecified essential hypertension   ° ° °SURGICAL HISTORY: °Past Surgical History:  °Procedure Laterality Date  ° ANKLE FRACTURE SURGERY Right 1989  ° "plate put in"  ° APPENDECTOMY  05-16-2004  ° open  ° BIOPSY  01/08/2021  ° Procedure: BIOPSY;  Surgeon: Schooler, Vincent, MD;  Location: [facility];  Service: Endoscopy;;  ° BIOPSY  05/02/2021  ° Procedure: BIOPSY;  Surgeon: Outlaw, William, MD;  Location: [facility];  Service: Endoscopy;;  ° CIRCUMCISION N/A 09/09/2013  ° Procedure: CIRCUMCISION ADULT;  Surgeon: David S Grapey,  MD;  Location: [facility];  Service: Urology;  Laterality: N/A;  ° COLECTOMY  05-16-2004  ° COLONOSCOPY N/A 05/02/2021  ° Procedure: COLONOSCOPY;  Surgeon: Outlaw, William, MD;  Location: [facility];  Service: Endoscopy;  Laterality: N/A;  ° COLONOSCOPY WITH PROPOFOL N/A 11/20/2020  ° Procedure: COLONOSCOPY WITH PROPOFOL;  Surgeon: Buccini, Robert, MD;  [facility]: [facility];  Service: Endoscopy;  Laterality: N/A;  ° CORONARY ANGIOPLASTY WITH STENT PLACEMENT  06/28/2012  DR COOPER  ° PCI W/  X1 DES to PROX. LAD/  LM  40% OSTIAL & 50-60% DISTAL /  50% PROX LCX/  30-40% PROX RCA & 50% MID RCA/   LVEF 65-70%  ° CORONARY ARTERY BYPASS GRAFT N/A 03/31/2014  ° Procedure: CORONARY ARTERY BYPASS GRAFTING (CABG) times 3 using left internal mammary artery and right saphenous vein.;  Surgeon: Steven C Hendrickson, MD;  Location: [facility];  Service: Open Heart Surgery;  Laterality: N/A;  ° DOBUTAMINE STRESS ECHO  06-08-2012  ° MODERATE HYPOKINESIS/ ISCHEMIA MID INFERIOR WALL  ° ESOPHAGOGASTRODUODENOSCOPY  08/15/2012  ° Procedure: ESOPHAGOGASTRODUODENOSCOPY (EGD);  Surgeon: Salem F Ganem, MD;  [facility]: [facility];  Service: Endoscopy;  Laterality: N/A;  ° ESOPHAGOGASTRODUODENOSCOPY N/A 02/17/2014  ° Procedure: ESOPHAGOGASTRODUODENOSCOPY (EGD);  Surgeon: Marc E Magod, MD;  [facility];  Service: Endoscopy;  Laterality: N/A;  ° ESOPHAGOGASTRODUODENOSCOPY N/A 04/17/2020  ° Procedure: ESOPHAGOGASTRODUODENOSCOPY (EGD);  Surgeon: Karki, Arya, MD;  [facility];  Service: Gastroenterology;  Laterality: N/A;  ° ESOPHAGOGASTRODUODENOSCOPY N/A 09/11/2020  ° Procedure: ESOPHAGOGASTRODUODENOSCOPY (EGD);  Surgeon: Magod, Marc, MD;  [facility];  Service: Endoscopy;  Laterality: N/A;  ° ESOPHAGOGASTRODUODENOSCOPY  10/09/2020  ° ESOPHAGOGASTRODUODENOSCOPY N/A 10/09/2020  ° Procedure: ESOPHAGOGASTRODUODENOSCOPY (EGD);  Surgeon: Brahmbhatt, Parag, MD;  [facility];  Service: Gastroenterology;   Laterality: N/A;  ° ESOPHAGOGASTRODUODENOSCOPY N/A 01/08/2021  ° Procedure: ESOPHAGOGASTRODUODENOSCOPY (EGD);  Surgeon: Schooler, Vincent, MD;  [facility];  Service: Endoscopy;  Laterality: N/A;  ° ESOPHAGOGASTRODUODENOSCOPY N/A 05/02/2021  ° Procedure: ESOPHAGOGASTRODUODENOSCOPY (EGD);  Surgeon: Outlaw, William, MD;  [facility];  Service: Endoscopy;  Laterality: N/A;  ° ESOPHAGOGASTRODUODENOSCOPY (EGD) WITH PROPOFOL N/A 09/30/2017  ° Procedure: ESOPHAGOGASTRODUODENOSCOPY (EGD) WITH PROPOFOL;  Surgeon: Ganem, Salem F, MD;  [facility];  Service: Endoscopy;  Laterality: N/A;  ° ESOPHAGOGASTRODUODENOSCOPY (EGD) WITH PROPOFOL N/A 12/20/2017  ° Procedure: ESOPHAGOGASTRODUODENOSCOPY (EGD) WITH PROPOFOL;  Surgeon: Magod, Marc, MD;  [facility];  Service: Endoscopy;  Laterality: N/A;  ° ESOPHAGOGASTRODUODENOSCOPY (EGD) WITH PROPOFOL N/A 08/10/2020  ° Procedure: ESOPHAGOGASTRODUODENOSCOPY (EGD) WITH PROPOFOL;  Surgeon: Schooler, Vincent, MD;  [facility];  Service: Endoscopy;  Laterality: N/A;  ° ESOPHAGOGASTRODUODENOSCOPY (EGD) WITH PROPOFOL N/A 11/20/2020  ° Procedure: ESOPHAGOGASTRODUODENOSCOPY (EGD) WITH PROPOFOL;  Surgeon: Buccini, Robert, MD;  [facility];  Service: Endoscopy;  Laterality: N/A;  ° ESOPHAGOGASTRODUODENOSCOPY (EGD) WITH PROPOFOL N/A 02/28/2021  ° Procedure: ESOPHAGOGASTRODUODENOSCOPY (EGD) WITH PROPOFOL;  Surgeon: Magod, Marc, MD;    [facility];  Service: Endoscopy;  Laterality: N/A;  ° GIVENS CAPSULE STUDY N/A 11/20/2020  ° Procedure: GIVENS CAPSULE STUDY;  Surgeon: Buccini, Robert, MD;  [facility];  Service: Endoscopy;  Laterality: N/A;  ° HEMOSTASIS CLIP PLACEMENT  05/02/2021  ° Procedure: HEMOSTASIS CLIP PLACEMENT;  Surgeon: Outlaw, William, MD;  [facility];  Service: Endoscopy;;  ° HERNIA REPAIR    ° INTRAOPERATIVE TRANSESOPHAGEAL ECHOCARDIOGRAM N/A 03/31/2014  ° Procedure: INTRAOPERATIVE TRANSESOPHAGEAL  ECHOCARDIOGRAM;  Surgeon: Steven C Hendrickson, MD;  [facility];  Service: Open Heart Surgery;  Laterality: N/A;  ° IR ANGIOGRAM SELECTIVE EACH ADDITIONAL VESSEL  06/16/2020  ° IR ANGIOGRAM SELECTIVE EACH ADDITIONAL VESSEL  06/16/2020  ° IR ANGIOGRAM SELECTIVE EACH ADDITIONAL VESSEL  06/16/2020  ° IR ANGIOGRAM SELECTIVE EACH ADDITIONAL VESSEL  06/16/2020  ° IR ANGIOGRAM SELECTIVE EACH ADDITIONAL VESSEL  06/16/2020  ° IR ANGIOGRAM SELECTIVE EACH ADDITIONAL VESSEL  06/16/2020  ° IR ANGIOGRAM SELECTIVE EACH ADDITIONAL VESSEL  06/16/2020  ° IR ANGIOGRAM SELECTIVE EACH ADDITIONAL VESSEL  06/16/2020  ° IR ANGIOGRAM SELECTIVE EACH ADDITIONAL VESSEL  06/16/2020  ° IR ANGIOGRAM SELECTIVE EACH ADDITIONAL VESSEL  06/30/2020  ° IR ANGIOGRAM SELECTIVE EACH ADDITIONAL VESSEL  06/30/2020  ° IR ANGIOGRAM VISCERAL SELECTIVE  06/16/2020  ° IR ANGIOGRAM VISCERAL SELECTIVE  06/30/2020  ° IR EMBO ARTERIAL NOT HEMORR HEMANG INC GUIDE ROADMAPPING  06/16/2020  ° IR EMBO TUMOR ORGAN ISCHEMIA INFARCT INC GUIDE ROADMAPPING  06/30/2020  ° IR EMBO VENOUS NOT HEMORR HEMANG  INC GUIDE ROADMAPPING  01/13/2021  ° IR IVUS EACH ADDITIONAL NON CORONARY VESSEL  01/13/2021  ° IR RADIOLOGIST EVAL & MGMT  05/06/2020  ° IR RADIOLOGIST EVAL & MGMT  05/20/2020  ° IR RADIOLOGIST EVAL & MGMT  05/25/2021  ° IR RADIOLOGIST EVAL & MGMT  09/22/2021  ° IR TIPS  01/13/2021  ° IR US GUIDE VASC ACCESS RIGHT  06/16/2020  ° IR US GUIDE VASC ACCESS RIGHT  06/30/2020  ° IR US GUIDE VASC ACCESS RIGHT  01/13/2021  ° LAPAROSCOPIC UMBILICAL HERNIA REPAIR W/ MESH  06-06-2011  ° LEFT HEART CATHETERIZATION WITH CORONARY ANGIOGRAM N/A 03/28/2014  ° Procedure: LEFT HEART CATHETERIZATION WITH CORONARY ANGIOGRAM;  Surgeon: Henry W Smith III, MD;  [facility];  Service: Cardiovascular;  Laterality: N/A;  ° LIPOMA EXCISION Left 08/25/2017  ° Procedure: EXCISION LIPOMA LEFT POSTERIOR THIGH;  Surgeon: Connor, Chelsea A, MD;  [facility];  Service: General;  Laterality: Left;  °  NEPHROLITHOTOMY  1990'S  ° OPEN APPENDECTOMY W/ PARTIAL CECECTOMY  05-16-2004  ° PERCUTANEOUS CORONARY STENT INTERVENTION (PCI-S) N/A 06/28/2012  ° Procedure: PERCUTANEOUS CORONARY STENT INTERVENTION (PCI-S);  Surgeon: Michael Cooper, MD;  [facility];  Service: Cardiovascular;  Laterality: N/A;  ° POLYPECTOMY  11/20/2020  ° Procedure: POLYPECTOMY;  Surgeon: Buccini, Robert, MD;  [facility];  Service: Endoscopy;;  ° POLYPECTOMY  05/02/2021  ° Procedure: POLYPECTOMY;  Surgeon: Outlaw, William, MD;  [facility];  Service: Endoscopy;;  ° RADIOLOGY WITH ANESTHESIA N/A 01/13/2021  ° Procedure: IR WITH ANESTHESIA - TIPS;  Surgeon: Suttle, Dylan J, MD;  [facility];  Service: Radiology;  Laterality: N/A;  ° ° °SOCIAL HISTORY: °Social History  ° °Socioeconomic History  ° Marital status: Married  °  Spouse name: Not on file  ° Number of children: Not on file  ° Years of education: Not on file  ° Highest education level: Not on file  °Occupational History  °   Not on file  °Tobacco Use  ° Smoking status: Former  °  Packs/day: 2.00  °  Years: 40.00  °  Pack years: 80.00  °  Types: Cigarettes  °  Quit date: 08/29/1996  °  Years since quitting: 25.1  ° Smokeless tobacco: Never  °Vaping Use  ° Vaping Use: Never used  °Substance and Sexual Activity  ° Alcohol use: Not Currently  °  Alcohol/week: 8.0 standard drinks  °  Types: 8 Cans of beer per week  °  Comment: 2 beers every other day  ° Drug use: No  ° Sexual activity: Yes  °Other Topics Concern  ° Not on file  °Social History Narrative  ° Lives in [place] with wife, son, and son's family.  ° °Social Determinants of Health  ° °Financial Resource Strain: Medium Risk  ° Difficulty of Paying Living Expenses: Somewhat hard  °Food Insecurity: No Food Insecurity  ° Worried About Running Out of Food in the Last Year: Never true  ° Ran Out of Food in the Last Year: Never true  °Transportation Needs: No Transportation Needs  ° Lack of Transportation (Medical): No   ° Lack of Transportation (Non-Medical): No  °Physical Activity: Not on file  °Stress: Not on file  °Social Connections: Not on file  °Intimate Partner Violence: Not on file  ° ° °FAMILY HISTORY: °Family History  °Problem Relation Age of Onset  ° Lung cancer Sister   ° Cancer Sister   °     lung  ° Cancer Mother   ° Cancer Father   °     died in his 70s.  ° Cancer Brother   °     lung  ° Coronary artery disease Other   ° Diabetes Other   ° Colon cancer Other   ° Cancer Sister   °     lung  ° ° °ALLERGIES:  is allergic to fluzone quadrivalent [influenza vac split quad]. ° °MEDICATIONS:  °Current Outpatient Medications  °Medication Sig Dispense Refill  ° Accu-Chek Softclix Lancets lancets USE AS DIRECTED TO TEST BLOOD SUGAR FOUR TIMES DAILY 300 each 3  ° albuterol (VENTOLIN HFA) 108 (90 Base) MCG/ACT inhaler Inhale 2 puffs into the lungs every 6 (six) hours as needed for wheezing or shortness of breath. 8 g 0  ° ANORO ELLIPTA 62.5-25 MCG/ACT AEPB Inhale 1 puff into the lungs daily. 1 each 1  ° blood glucose meter kit and supplies Dispense based on patient and insurance preference. Use up to four times daily as directed. (FOR ICD-10 E10.9, E11.9). 1 each 0  ° ferrous sulfate 325 (65 FE) MG tablet Take 1 tablet (325 mg total) by mouth daily with breakfast. 90 tablet 0  ° furosemide (LASIX) 40 MG tablet Take 1 tablet (40 mg total) by mouth daily. 90 tablet 1  ° lactulose (CHRONULAC) 10 GM/15ML solution Take 45 mLs (30 g total) by mouth 3 (three) times daily. 1892 mL 1  ° pantoprazole (PROTONIX) 40 MG tablet Take 1 tablet (40 mg total) by mouth 2 (two) times daily before a meal.    ° rifaximin (XIFAXAN) 550 MG TABS tablet Take 1 tablet (550 mg total) by mouth 2 (two) times daily. 60 tablet 0  ° spironolactone (ALDACTONE) 50 MG tablet Take 1 tablet (50 mg total) by mouth daily. 90 tablet 1  ° °No current facility-administered medications for this visit.  ° ° °PHYSICAL EXAMINATION: °ECOG PERFORMANCE STATUS: 1 - Symptomatic  but completely ambulatory ° °Vitals:  °   09/29/21 1316  °BP: (!) 145/45  °Pulse: 78  °Resp: 16  °Temp: (!) 97.5 °F (36.4 °C)  °SpO2: 100%  ° °Filed Weights  ° 09/29/21 1316  °Weight: 170 lb 3.2 oz (77.2 kg)  ° °GENERAL:alert, falls asleep during conversation but easily arousable °SKIN: skin color, texture, mild icterus °EYES: normal, conjunctiva are pink and non-injected, sclera clear °OROPHARYNX:no exudate, no erythema and lips, buccal mucosa, and tongue normal  °LUNGS: Mostly clear even in lower lung fields.  No adventitious sounds °HEART: regular rate & rhythm and no murmurs , severe pitting BLE edema. °ABDOMEN: Multiple bruises, detailed abdominal exam not done today °Musculoskeletal:no cyanosis of digits and no clubbing  °PSYCH: speech is not fluent. °NEURO: no focal motor/sensory deficits ° °LABORATORY DATA:  °I have reviewed the data as listed °Lab Results  °Component Value Date  ° WBC 3.6 (L) 09/27/2021  ° HGB 7.6 Repeated and verified X2. (LL) 09/27/2021  ° HCT 23.8 Repeated and verified X2. (LL) 09/27/2021  ° MCV 81.6 09/27/2021  ° PLT 159.0 09/27/2021  ° °  Chemistry   °   °Component Value Date/Time  ° NA 132 (L) 09/27/2021 1146  ° NA 139 08/25/2020 1420  ° K 4.2 09/27/2021 1146  ° CL 98 09/27/2021 1146  ° CO2 28 09/27/2021 1146  ° BUN 17 09/27/2021 1146  ° BUN 26 08/25/2020 1420  ° CREATININE 1.07 09/27/2021 1146  ° CREATININE 1.13 05/20/2021 1444  ° CREATININE 0.75 06/09/2015 1609  °    °Component Value Date/Time  ° CALCIUM 8.7 09/27/2021 1146  ° ALKPHOS 267 (H) 09/27/2021 1146  ° AST 44 (H) 09/27/2021 1146  ° AST 43 (H) 05/20/2021 1444  ° ALT 28 09/27/2021 1146  ° ALT 29 05/20/2021 1444  ° BILITOT 1.0 09/27/2021 1146  ° BILITOT 0.9 05/20/2021 1444  °  ° ° °RADIOGRAPHIC STUDIES: °I have personally reviewed the radiological images as listed and agreed with the findings in the report. °CT Head Wo Contrast ° °Result Date: 09/16/2021 °CLINICAL DATA:  Altered mental status EXAM: CT HEAD WITHOUT CONTRAST  TECHNIQUE: Contiguous axial images were obtained from the base of the skull through the vertex without intravenous contrast. RADIATION DOSE REDUCTION: This exam was performed according to the departmental dose-optimization program which includes automated exposure control, adjustment of the mA and/or kV according to patient size and/or use of iterative reconstruction technique. COMPARISON:  CT head 07/23/2021 FINDINGS: Brain: There is no evidence of acute intracranial hemorrhage, extra-axial fluid collection, or acute infarct. Parenchymal volume is normal. The ventricles are normal in size. Patchy hypodensity throughout the subcortical and periventricular white matter likely reflects sequela of moderate chronic white matter microangiopathy. There is no mass lesion.  There is no midline shift. Vascular: There is calcification of the bilateral cavernous ICAs. Skull: Normal. Negative for fracture or focal lesion. Sinuses/Orbits: The paranasal sinuses are clear. The globes and orbits are unremarkable. Other: None. IMPRESSION: No acute intracranial pathology. Electronically Signed   By: Peter  Noone M.D.   On: 09/16/2021 08:11  ° °CT CHEST WO CONTRAST ° °Result Date: 09/10/2021 °CLINICAL DATA:  Abnormal chest x-ray. Lung opacities. History of CHF. EXAM: CT CHEST WITHOUT CONTRAST TECHNIQUE: Multidetector CT imaging of the chest was performed following the standard protocol without IV contrast. RADIATION DOSE REDUCTION: This exam was performed according to the departmental dose-optimization program which includes automated exposure control, adjustment of the mA and/or kV according to patient size and/or use of iterative reconstruction technique. COMPARISON:  CT abdomen and   pelvis from yesterday. CTA chest dated December 27, 2019. CT chest dated October 01, 2017. FINDINGS: Cardiovascular: Unchanged mild cardiomegaly. No pericardial effusion. No thoracic aortic aneurysm. Coronary, aortic arch, and branch vessel atherosclerotic  vascular disease. Mediastinum/Nodes: New mildly enlarged right paratracheal lymph nodes measuring up to 1.4 cm in short axis (series 3, image 49). New subcarinal lymphadenopathy measuring up to 2.1 cm in short axis (series 3, image 78). Unchanged calcified mediastinal and bilateral hilar lymph nodes. No enlarged axillary lymph nodes. The thyroid gland, trachea, and esophagus demonstrate no significant findings. Lungs/Pleura: Small to moderate right and trace left pleural effusions. Round consolidation in the posterior right lower lobe is unchanged compared to yesterday's CT, but new since April 2021. Diffuse central peribronchial thickening and smooth interlobular septal thickening throughout both lungs. 7 mm nodule in the anterior right upper lobe (series 4, image 66), unchanged since April 2021, benign. No pneumothorax. Upper Abdomen: No acute abnormality.  No change from yesterday's CT. Musculoskeletal: No acute or significant osseous findings. IMPRESSION: 1. Round mass-like consolidation in the posterior right lower lobe is unchanged compared to yesterday's CT, but new since June 2022. In light of the new mediastinal lymphadenopathy and patient's smoking history, findings are concerning for primary bronchogenic neoplasm. Consultation with pulmonary medicine or thoracic surgery is suggested, as clinically appropriate. 2. Small to moderate right and trace left pleural effusions with mild to moderate interstitial pulmonary edema. 3. Aortic Atherosclerosis (ICD10-I70.0). Electronically Signed   By: William T Derry M.D.   On: 09/10/2021 09:14  ° °CT ABDOMEN PELVIS W CONTRAST ° °Result Date: 09/09/2021 °CLINICAL DATA:  Cirrhosis. Hepatocellular carcinoma. Previous TIPS. Upper GI bleeding. Weakness and confusion. EXAM: CT ABDOMEN AND PELVIS WITH CONTRAST TECHNIQUE: Multidetector CT imaging of the abdomen and pelvis was performed using the standard protocol following bolus administration of intravenous contrast.  CONTRAST:  100mL OMNIPAQUE IOHEXOL 300 MG/ML  SOLN COMPARISON:  Noncontrast CT on 01/29/2021 FINDINGS: Lower Chest: New moderate right pleural effusion and tiny left pleural effusion are seen. New rounded area of consolidation in the posterior right lower lobe measures 3 x 4 cm, and may be inflammatory, infectious, or neoplastic in etiology. Hepatobiliary: Hepatic cirrhosis again demonstrated. A subcapsular cyst measuring 3 cm in the lateral liver dome remains stable. No suspicious liver lesions are identified. TIPS is again seen, with patent portal veins and IVC. Hepatic veins are also patent. Gallbladder is unremarkable. No evidence of biliary ductal dilatation. Pancreas:  No mass or inflammatory changes. Spleen: Within normal limits in size. A few tiny cysts and calcified granulomata are stable. No masses identified. Adrenals/Urinary Tract: No masses identified. Diffuse right renal atrophy is seen as well as a 13 mm right renal calculus. No evidence of ureteral calculi or hydronephrosis. Urinary bladder is distended but otherwise unremarkable in appearance. Stomach/Bowel: No evidence of obstruction, inflammatory process or abnormal fluid collections. Large stool burden seen throughout the colon. Vascular/Lymphatic: No pathologically enlarged lymph nodes. No acute vascular findings. Aortic atherosclerotic calcification noted. Recanalization of paraumbilical veins and esophageal varices again seen, consistent with portal venous hypertension. Reproductive:  No mass or other significant abnormality. Other:  Mild diffuse body wall edema, but no evidence ascites. Musculoskeletal:  No suspicious bone lesions identified. : New moderate right pleural effusion and tiny left pleural effusion. New masslike consolidation in posterior right lower lobe, which may be inflammatory, infectious, or neoplastic in etiology. Consider chest CT for further evaluation. No acute findings in the abdomen or pelvis. Hepatic cirrhosis and  findings of portal   venous hypertension. No evidence of recurrent hepatocellular carcinoma or metastatic disease. Large stool burden noted; recommend clinical correlation for possible constipation. Right renal atrophy and nephrolithiasis. No evidence of ureteral calculi or hydronephrosis. Aortic Atherosclerosis (ICD10-I70.0). Electronically Signed   By: Marlaine Hind M.D.   On: 09/09/2021 15:13   DG CHEST PORT 1 VIEW  Result Date: 09/11/2021 CLINICAL DATA:  Altered mental status.  Shortness of breath EXAM: PORTABLE CHEST 1 VIEW COMPARISON:  09/08/2021, 09/10/2021 FINDINGS: Stable cardiomegaly status post CABG. Aortic atherosclerosis. Diffuse interstitial prominence compatible with edema. Known posterior right lower lobe mass-like consolidation is not well seen radiographically. Trace right pleural effusion. No pneumothorax. IMPRESSION: 1. Cardiomegaly with interstitial pulmonary edema. 2. Known posterior right lower lobe mass-like consolidation is not well seen radiographically. Electronically Signed   By: Davina Poke D.O.   On: 09/11/2021 14:30   DG Chest Port 1 View  Result Date: 09/08/2021 CLINICAL DATA:  Weakness, confused, disoriented, history of CHF EXAM: PORTABLE CHEST 1 VIEW COMPARISON:  Chest radiograph 05/28/2021 FINDINGS: Median sternotomy wires and mediastinal surgical clips are again noted. The heart is enlarged, unchanged. The mediastinal contours are grossly stable. There are diffusely increased interstitial markings throughout both lungs along with central vascular congestion. There is no focal consolidation. There is no significant pleural effusion. There is no pneumothorax There is no acute osseous abnormality. IMPRESSION: Cardiomegaly with probable mild to moderate pulmonary interstitial edema. No focal consolidation or significant pleural effusion. Electronically Signed   By: Valetta Mole M.D.   On: 09/08/2021 18:10   US ABDOMINAL PELVIC ART/VENT FLOW DOPPLER  Result Date:  09/22/2021 CLINICAL DATA:  76 year old male with history of portal hypertension status post TIPS creation on 01/13/2021 EXAM: DUPLEX ULTRASOUND OF LIVER AND TIPS SHUNT TECHNIQUE: Color and duplex Doppler ultrasound was performed to evaluate the hepatic in-flow and out-flow vessels. COMPARISON:  09/16/2021, 01/13/2021 FINDINGS: Portal Vein Velocities Main:  60 cm/sec, antegrade Right:  27 cm/sec, antegrade Left:  8 cm/sec, antegrade TIPS Stent Velocities Proximal:  86 cm/sec, antegrade Mid:  254 cm/sec, antegrade Distal:  153 cm/sec, antegrade IVC: Present and patent with normal respiratory phasicity. Hepatic Vein Velocities Right:  44 cm/sec Mid:  40 cm/sec Left:  43 cm/sec Splenic Vein: 42 cm per second, antegrade Superior Mesenteric Vein: Not visualized. Hepatic Artery: 145 cm/sec peak systolic velocity. Ascities: Absent. Varices: Absent. Other findings: Echogenic mass in the right lobe of the liver measuring up to 2.8 cm, compatible with prior treated hepatocellular carcinoma. IMPRESSION: Patent TIPS with increased central velocities suggestive of possible stenosis. Ruthann Cancer, MD [facility] Electronically Signed   By: Ruthann Cancer M.D.   On: 09/22/2021 11:32   IR Radiologist Eval & Mgmt  Result Date: 09/22/2021 Please refer to notes tab for details about interventional procedure. (Op Note)  CT Angio Abd/Pel w/ and/or w/o  Result Date: 09/16/2021 CLINICAL DATA:  76 year old male history of cirrhosis, hepatocellular carcinoma, gastrointestinal hemorrhage, and chronic anemia status post TIPS creation with gastroesophageal variceal coil embolization on 01/13/2021. EXAM: CT ANGIOGRAPHY ABDOMEN AND PELVIS WITH CONTRAST AND WITHOUT CONTRAST TECHNIQUE: Multidetector CT imaging of the abdomen and pelvis was performed using the standard protocol during bolus administration of intravenous contrast. Multiplanar reconstructed images and MIPs were obtained  and reviewed to evaluate the vascular anatomy. CONTRAST:  149m OMNIPAQUE IOHEXOL 350 MG/ML SOLN COMPARISON:  09/08/2021, 01/29/2021, 01/13/2021, 01/08/2021 FINDINGS: VASCULAR Aorta: Atherosclerosis without signficant stenosis, dissection, or aneurysm. Celiac: Obscured secondary to streak artifact. SMA: Patent  without evidence of aneurysm, dissection, vasculitis or significant stenosis. Renals: Similar appearing ostial occlusion versus high-grade stenosis of the single right renal artery secondary to atherosclerotic plaque. The artery is patent distally. Dual left renal arteries appear patent with mild ostial stenosis of the superior left renal artery. IMA: Patent without evidence of aneurysm, dissection, vasculitis or significant stenosis. Inflow: Patent without evidence of aneurysm, dissection, vasculitis or significant stenosis. Scattered atherosclerotic calcifications. Proximal Outflow: Bilateral common femoral and visualized portions of the superficial and profunda femoral arteries are patent without evidence of aneurysm, dissection, vasculitis or significant stenosis. Veins: The a patent veins are limited in visualization due to contrast bolus timing. Indwelling TIPS appears patent. The main portal, SMV common splenic vein appear patent. Status post coil embolization of 3 separate gastroesophageal varices arising from the splenic vein without definite evidence of recanalization. There are few mildly prominent distal esophageal varices present, decreased in size from pre-TIPS comparison. No evidence of splenorenal shunt. The renal veins are patent. No evidence of iliocaval thrombosis or anomaly. Review of the MIP images confirms the above findings. NON-VASCULAR Lower chest: Similar appearing rounded masslike consolidation about the sub pleural a posterior right lower lobe which measures up to proximally 4.1 x 3.6 cm in maximum axial dimensions. Similar appearing small right trace left pleural effusions. Similar  appearing mild cardiomegaly. No pericardial effusion. Hepatobiliary: Similar appearing morphologic changes of cirrhosis including contour nodularity and right lobe atrophy. Similar appearing hypoattenuating subcapsular segment 8 mass measuring up to proximally 2.6 x 2.5 cm in maximum axial dimensions without arterial hyperenhancement or washout. No new hepatomas. The gallbladder is present unremarkable. No intra or extrahepatic biliary ductal dilation. TIPS endograft in place, unchanged. Pancreas: Unremarkable. No pancreatic ductal dilatation or surrounding inflammatory changes. Spleen: Similar appearing scattered simple cysts and multifocal punctate calcified granuloma. No splenomegaly. Adrenals/Urinary Tract: Limited evaluation of the adrenal glands secondary to streak artifact. Persistent right renal atrophy. Unchanged calcified interpolar right renal calculus measuring up to 1.5 cm. No evidence of renal mass. No hydronephrosis. The bladder is mostly decompressed without significant wall thickening. Stomach/Bowel: Stomach is within normal limits. Appendix is not definitively identified. No evidence of bowel wall thickening, distention, or inflammatory changes. Lymphatic: No abdominopelvic lymphadenopathy. Reproductive: Prostate is unremarkable. Other: No ascites. Similar appearing tiny fat containing umbilical hernia. Musculoskeletal: Diffuse body wall anasarca. No acute or significant osseous findings. IMPRESSION: VASCULAR 1. Patent TIPS. A few small esophageal varices remain visible, decreased from pre-TIPS comparison. No evidence of definite recanalization of the previously embolized gastric varices. No evidence of development of ectopic varix. 2. Similar appearing ostial occlusion versus high-grade stenosis of the single right renal artery with associated right renal atrophy. 3.  Aortic Atherosclerosis (ICD10-I70.0). NON-VASCULAR 1. Similar appearing rounded masslike opacity in the right lower lobe which  measures up to 4.1 cm and remains suspicious for primary bronchogenic neoplasm. 2. Similar appearing changes of cirrhosis with unchanged appearance of previously treated (radiation segmentectomy) right lobe hepatocellular carcinoma (LI-RADS TR-nonviable). No new hepatoma. 3. Similar appearing nonobstructive right nephrolithiasis. Dylan Suttle, MD [facility] Electronically Signed   By: Dylan  Suttle M.D.   On: 09/16/2021 11:34   ° °ASSESSMENT & PLAN:  ° °Raul W Brunson is a 75 y.o. 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 ° °#Pancytopenia 2/2 cirrhosis: ° °--Current plan is to provide supportive care with transfusion support.  °--Labs today show Hgb of 7.4, white blood cell count of 3200 and platelet count   of 160,000 °--No indication for transfusion today. °--RTC in 2 weeks for labs and FU.  We have once again discussed the reason for pancytopenia in this patient with liver disease and the need for periodic transfusions and supportive care. °We are transfusing him for a hemoglobin of 7 g/dL or less unless active bleeding.  He is also not very symptomatic at least according to history from him today. ° ° °#Hx of hepatocellular carcinoma: °--Treated with Y 90 back in November 2021 °-- We have on multiple occasions discussed the importance of doing the MRI abdomen.  I have very clearly described this to the son as well.  I do not believe he still has his MRI done. °We gave him the phone number to call during his last visit as well. ° °#3 adenocarcinoma of lung, noted on pleural effusion.  I have discussed that since this is identified in the fluid, this indicates an advanced lung cancer, stage IV.  His PET/CT is pending on February 6 for staging.  I have recommended adding foundation 1 and PD-L1 testing on the pleural fluid sample but they do not have enough sample hence we will try to proceed with guardant 360. °At this time I  do not believe his candidate for any chemotherapy.  If he does not have any targetable mutations or high PD-L1, then he is best served with comfort care.  He does not understand the intensity of the situation, his performance status has been slowly deteriorating, he is not active, he falls asleep during conversations, he also has several other comorbidities such as cirrhosis and history of hepatocellular carcinoma. ° °Son is very much in agreement with these recommendations. °Have spent 40 minutes in the care of this patient including history, review of records, counseling and coordination of care as mentioned above. °  ° , MD °Hematology and Oncology °[facility] °P: [phone] ° ° ° ° °

## 2021-09-29 NOTE — Telephone Encounter (Signed)
See below. Same note as before. Just didn't get combined.

## 2021-09-29 NOTE — Telephone Encounter (Signed)
See below

## 2021-09-29 NOTE — Telephone Encounter (Signed)
[name] at [facility] called in regards to whether PT's diabetes/blood sugar levels were being monitored at home with a glucometer or if they were being tracked with A1C's at office by PCP.  CB [phone]

## 2021-09-29 NOTE — Telephone Encounter (Signed)
The message is pertaining to pt blood sugar, not bp, and how it should be monitored.   Do you want pt to check it? He needs supplies, and HH does not believe he will be compliant, and may be difficult to manage.   Requesting that MD advise on how to monitor.   Callback #- [phone]

## 2021-09-30 NOTE — Telephone Encounter (Signed)
We were tracking. I would not recommend need for glucometer at home.

## 2021-09-30 NOTE — Telephone Encounter (Signed)
See other phone note

## 2021-10-01 DIAGNOSIS — I5032 Chronic diastolic (congestive) heart failure: Secondary | ICD-10-CM | POA: Diagnosis not present

## 2021-10-01 DIAGNOSIS — Z951 Presence of aortocoronary bypass graft: Secondary | ICD-10-CM | POA: Diagnosis not present

## 2021-10-01 DIAGNOSIS — I2510 Atherosclerotic heart disease of native coronary artery without angina pectoris: Secondary | ICD-10-CM | POA: Diagnosis not present

## 2021-10-01 DIAGNOSIS — E785 Hyperlipidemia, unspecified: Secondary | ICD-10-CM | POA: Diagnosis not present

## 2021-10-01 DIAGNOSIS — I700 Atherosclerosis of aorta: Secondary | ICD-10-CM | POA: Diagnosis not present

## 2021-10-01 DIAGNOSIS — G928 Other toxic encephalopathy: Secondary | ICD-10-CM | POA: Diagnosis not present

## 2021-10-01 DIAGNOSIS — Z7951 Long term (current) use of inhaled steroids: Secondary | ICD-10-CM | POA: Diagnosis not present

## 2021-10-01 DIAGNOSIS — K279 Peptic ulcer, site unspecified, unspecified as acute or chronic, without hemorrhage or perforation: Secondary | ICD-10-CM | POA: Diagnosis not present

## 2021-10-01 DIAGNOSIS — M1000 Idiopathic gout, unspecified site: Secondary | ICD-10-CM | POA: Diagnosis not present

## 2021-10-01 DIAGNOSIS — D509 Iron deficiency anemia, unspecified: Secondary | ICD-10-CM | POA: Diagnosis not present

## 2021-10-01 DIAGNOSIS — R69 Illness, unspecified: Secondary | ICD-10-CM | POA: Diagnosis not present

## 2021-10-01 DIAGNOSIS — Z8505 Personal history of malignant neoplasm of liver: Secondary | ICD-10-CM | POA: Diagnosis not present

## 2021-10-01 DIAGNOSIS — N471 Phimosis: Secondary | ICD-10-CM | POA: Diagnosis not present

## 2021-10-01 DIAGNOSIS — R188 Other ascites: Secondary | ICD-10-CM | POA: Diagnosis not present

## 2021-10-01 DIAGNOSIS — D61811 Other drug-induced pancytopenia: Secondary | ICD-10-CM | POA: Diagnosis not present

## 2021-10-01 DIAGNOSIS — M545 Low back pain, unspecified: Secondary | ICD-10-CM | POA: Diagnosis not present

## 2021-10-01 DIAGNOSIS — M1990 Unspecified osteoarthritis, unspecified site: Secondary | ICD-10-CM | POA: Diagnosis not present

## 2021-10-01 DIAGNOSIS — G4733 Obstructive sleep apnea (adult) (pediatric): Secondary | ICD-10-CM | POA: Diagnosis not present

## 2021-10-01 DIAGNOSIS — K219 Gastro-esophageal reflux disease without esophagitis: Secondary | ICD-10-CM | POA: Diagnosis not present

## 2021-10-01 DIAGNOSIS — I110 Hypertensive heart disease with heart failure: Secondary | ICD-10-CM | POA: Diagnosis not present

## 2021-10-01 DIAGNOSIS — K745 Biliary cirrhosis, unspecified: Secondary | ICD-10-CM | POA: Diagnosis not present

## 2021-10-01 DIAGNOSIS — I864 Gastric varices: Secondary | ICD-10-CM | POA: Diagnosis not present

## 2021-10-01 DIAGNOSIS — K766 Portal hypertension: Secondary | ICD-10-CM | POA: Diagnosis not present

## 2021-10-01 DIAGNOSIS — T40711D Poisoning by cannabis, accidental (unintentional), subsequent encounter: Secondary | ICD-10-CM | POA: Diagnosis not present

## 2021-10-01 DIAGNOSIS — E119 Type 2 diabetes mellitus without complications: Secondary | ICD-10-CM | POA: Diagnosis not present

## 2021-10-01 NOTE — Telephone Encounter (Signed)
Unable to relay information due to the call back number being incorrect.

## 2021-10-04 ENCOUNTER — Encounter: Payer: Self-pay | Admitting: Internal Medicine

## 2021-10-04 ENCOUNTER — Inpatient Hospital Stay: Payer: Medicare HMO

## 2021-10-04 ENCOUNTER — Other Ambulatory Visit: Payer: Self-pay

## 2021-10-04 ENCOUNTER — Telehealth: Payer: Self-pay | Admitting: *Deleted

## 2021-10-04 ENCOUNTER — Ambulatory Visit (HOSPITAL_COMMUNITY): Payer: Medicare HMO

## 2021-10-04 ENCOUNTER — Other Ambulatory Visit: Payer: Medicare HMO | Admitting: Internal Medicine

## 2021-10-04 ENCOUNTER — Other Ambulatory Visit: Payer: Self-pay | Admitting: *Deleted

## 2021-10-04 DIAGNOSIS — K7030 Alcoholic cirrhosis of liver without ascites: Secondary | ICD-10-CM

## 2021-10-04 DIAGNOSIS — C221 Intrahepatic bile duct carcinoma: Secondary | ICD-10-CM | POA: Diagnosis not present

## 2021-10-04 DIAGNOSIS — R918 Other nonspecific abnormal finding of lung field: Secondary | ICD-10-CM | POA: Diagnosis not present

## 2021-10-04 DIAGNOSIS — J910 Malignant pleural effusion: Secondary | ICD-10-CM | POA: Diagnosis not present

## 2021-10-04 DIAGNOSIS — D696 Thrombocytopenia, unspecified: Secondary | ICD-10-CM

## 2021-10-04 DIAGNOSIS — Z87891 Personal history of nicotine dependence: Secondary | ICD-10-CM | POA: Diagnosis not present

## 2021-10-04 DIAGNOSIS — D500 Iron deficiency anemia secondary to blood loss (chronic): Secondary | ICD-10-CM

## 2021-10-04 DIAGNOSIS — D649 Anemia, unspecified: Secondary | ICD-10-CM

## 2021-10-04 DIAGNOSIS — D61818 Other pancytopenia: Secondary | ICD-10-CM

## 2021-10-04 DIAGNOSIS — K7460 Unspecified cirrhosis of liver: Secondary | ICD-10-CM | POA: Diagnosis not present

## 2021-10-04 LAB — CBC WITH DIFFERENTIAL/PLATELET
Abs Immature Granulocytes: 0.02 10*3/uL (ref 0.00–0.07)
Basophils Absolute: 0 10*3/uL (ref 0.0–0.1)
Basophils Relative: 0 %
Eosinophils Absolute: 0.1 10*3/uL (ref 0.0–0.5)
Eosinophils Relative: 3 %
HCT: 21.7 % — ABNORMAL LOW (ref 39.0–52.0)
Hemoglobin: 6.8 g/dL — CL (ref 13.0–17.0)
Immature Granulocytes: 1 %
Lymphocytes Relative: 13 %
Lymphs Abs: 0.4 10*3/uL — ABNORMAL LOW (ref 0.7–4.0)
MCH: 25.1 pg — ABNORMAL LOW (ref 26.0–34.0)
MCHC: 31.3 g/dL (ref 30.0–36.0)
MCV: 80.1 fL (ref 80.0–100.0)
Monocytes Absolute: 0.3 10*3/uL (ref 0.1–1.0)
Monocytes Relative: 11 %
Neutro Abs: 2.4 10*3/uL (ref 1.7–7.7)
Neutrophils Relative %: 72 %
Platelets: 134 10*3/uL — ABNORMAL LOW (ref 150–400)
RBC: 2.71 MIL/uL — ABNORMAL LOW (ref 4.22–5.81)
RDW: 18.1 % — ABNORMAL HIGH (ref 11.5–15.5)
WBC: 3.2 10*3/uL — ABNORMAL LOW (ref 4.0–10.5)
nRBC: 0 % (ref 0.0–0.2)

## 2021-10-04 LAB — PREPARE RBC (CROSSMATCH)

## 2021-10-04 LAB — SAMPLE TO BLOOD BANK

## 2021-10-04 NOTE — Progress Notes (Deleted)
Due to the COVID-19 crisis, this visit was done via telemedicine from my office and it was initiated and consent by this patient and or family.  I connected with  [name] OR PROXY on 10/04/21 by a telemedicine application and verified that I am speaking with the correct person using two identifiers.   I discussed the limitations of evaluation and management by telemedicine. The patient expressed understanding and agreed to proceed.     [employer] Palliative Care Consult Note Telephone: [phone]  Fax: [phone]   Date of encounter: 10/04/21 11:52 AM PATIENT NAME: [name] [place] [id number]   [id number] (home)  DOB: [birth year] MRN: [id number] PRIMARY CARE PROVIDER:    [name], MD,  [place] [address] [phone]  REFERRING PROVIDER:   [name], MD [address] [place],  [place] [address] [phone]  RESPONSIBLE PARTY:    Contact Information     Name Relation Home Work [place] Son [phone]  [phone]   Vinay, Ertl [phone]  [phone]        I met via telephone with patient and family.  Pt was unable to connect via audiovisual format.  Palliative Care was asked to follow this patient by consultation request of  [name], *to address advance care planning and complex medical decision making. This is the initial visit.                                     ASSESSMENT AND PLAN / RECOMMENDATIONS:   Advance Care Planning/Goals of Care: Goals include to maximize quality of life and symptom management. Patient/health care surrogate gave his/her permission to discuss.Our advance care planning conversation included a discussion about:    The value and importance of advance care planning  Experiences with loved ones who have been seriously ill or have died  Exploration of personal, cultural or spiritual beliefs  that might influence medical decisions  Exploration of goals of care in the event of a sudden injury or illness  Identification  of a healthcare agent  Review and updating or creation of an  advance directive document . In Dec 2021, MOST completed in the hospital with [facility] palliative care NP as follows:  FULL CODE, full scope of treatment, but no prolonged ventilation, antibiotics if indicated, IVFs for a defined trial period and no feeding tube. Decision not to resuscitate or to de-escalate disease focused treatments due to poor prognosis. CODE STATUS:  Symptom Management/Plan:    Follow up Palliative Care Visit: Palliative care will continue to follow for complex medical decision making, advance care planning, and clarification of goals. Return *** weeks or prn.   This visit was coded based on medical decision making (MDM).***  PPS: ***0%  HOSPICE ELIGIBILITY/DIAGNOSIS: TBD  Chief Complaint: ***  HISTORY OF PRESENT ILLNESS:  AARONMICHAEL BRUMBAUGH is a 76 y.o. year old male  with a h/o hepatocellular carcinoma, s/p Y 90 treatment and coil embolization, hepatic cirrhosis with portal hypertension, gastric varices s/p TIPS, and hepatic encephalopathy, right primary lung adenocarcinoma, DMII that's well-controlled, pancytopenia, peptic ulcer disease, iron deficiency anemia, COPD, pulmonary hypertension, HTN, OSA, phimosis, OA of spine, hyperlipidemia, gout, diastolic heart failure, and CAD.  He has been hospitalized 6 times since 04/28/21.  The first was for sob and volume overload and subsequent all involved somnolence, weakness  and hepatic encephalopathy.  He also had an UGIB.  He required protonix, octreotide, transfusion, and was found to have a new right lower lobe mass and pleural effusion during his 1/11-1/15/23 admission.  No repeat scopes were done (last 04/2021).  He underwent a right thoracentesis with 1300 cc fluid removed and outpatient PET was recommended.  was discharged on a 1200cc fluid  restriction.  His prognosis was noted to be very poor.   Reportedly during his last admission on 1/15, code status was DNR upon discussion with his son.  This palliative care f/u was arranged.  Unfortunately, he ingested accidentally some thc infused gummies and became quite lethargic so he was again seen in the ED 1/19-21.  His family declined SNF placement that was recommended at the hospital the last two times.  Per oncology note from 09/29/21, "CT chest showed round masslike consolidation in the posterior right lower lobe new since June 2022 in light of the new mediastinal lymphadenopathy and patient smoking history findings are concerning for primary bronchogenic neoplasm.  Small to moderate right and trace left pleural effusions were also noted with mild to moderate interstitial pulmonary edema. He had cytology from the right pleural effusion which showed malignant cells consistent with adenocarcinoma.  PET/CT scheduled for February 6 (done this am at 10am)."  Dr. Chryl Heck also notes that he was unable to describe his current health status nor was he a reasonable candidate for chemo.  A pleural fluid sample was obtained previously for guardant 360 testing but if he does not have targetable mutations or high PD-L1, she recommends comfort care.  He also has poor performance status, was drowsy during the visit and has several comorbidities.  Today, his hgb was noted to be 6.8 so outpatient transfusion has been arranged for 8am tomorrow.  WBC was 3.2, platelets 134, 09/27/21 labs reviewed, as well:  Na 132, glucose 164, AP 267, AST 44, ALT 28, albumin 2.7, BUN 17, cr 1.07 with GFR 67.  Last ammonia was 44 on 1/19, but seen as high as 137 on 1/11.  He has lost 48 lbs since November 2022.  07/25/21 was 218 lbs and now 170.2 lbs with BMI of 22.  History obtained from review of EMR, discussion with primary team, and interview with family, facility staff/caregiver and/or [name].   I reviewed available labs,  medications, imaging, studies and related documents from the EMR.  Records reviewed and summarized above.   ROS  *** General: NAD EYES: denies vision changes ENMT: denies dysphagia Cardiovascular: denies chest pain, denies DOE Pulmonary: denies cough, denies increased SOB Abdomen: endorses good appetite, denies constipation, endorses continence of bowel GU: denies dysuria, endorses continence of urine MSK:  denies increased weakness,  no falls reported Skin: denies rashes or wounds Neurological: denies pain, denies insomnia Psych: Endorses positive mood Heme/lymph/immuno: denies bruises, abnormal bleeding  Physical Exam: Current and past weights:He has lost 48 lbs since November 2022.  07/25/21 was 218 lbs and now 170.2 lbs with BMI of 22. Constitutional: NAD General: frail appearing, thin/WNWD/obese  EYES: anicteric sclera, lids intact, no discharge  ENMT: intact hearing, oral mucous membranes moist, dentition intact CV: S1S2, RRR, no LE edema Pulmonary: LCTA, no increased work of breathing, no cough, room air Abdomen: intake 100%, normo-active BS + 4 quadrants, soft and non tender, no ascites GU: deferred MSK: no sarcopenia, moves all extremities, ambulatory Skin: warm and dry, no rashes or wounds on visible skin Neuro:  no generalized weakness,  no cognitive impairment  Psych: non-anxious affect, A and O x 3 Hem/lymph/immuno: no widespread bruising CURRENT PROBLEM LIST:  Patient Active Problem List   Diagnosis Date Noted   Primary lung adenocarcinoma, right ([place]) 09/28/2021   Lactic acidosis 09/08/2021   GERD (gastroesophageal reflux disease) 09/08/2021   Chronic diastolic CHF (congestive heart failure) (HCC)    Generalized weakness    Hypokalemia 07/23/2021   Hepatocellular carcinoma ([place]) 08/09/2020   Routine general medical examination at a health care facility 05/06/2020   Iron deficiency anemia due to chronic blood loss    Pancytopenia ([place]) 04/15/2020   Pulmonary  hypertension, unspecified ([place]) 03/11/2020   Asthma, mild intermittent 18/56/3149   Alcoholic cirrhosis of liver with ascites ([place]) 12/27/2019   Acute on chronic anemia 12/18/2017   Peptic ulcer disease 12/18/2017   SOB (shortness of breath) 09/21/2017   Obesity (BMI 30.0-34.9) 02/05/2016   S/P CABG x 3 03/31/2014   Hepatic cirrhosis ([place]) 02/27/2014   Melena 08/14/2012   Hyperlipidemia with target LDL less than 70 06/29/2012   Thrombocytopenia ([place]) 06/29/2012   Coronary artery disease involving native coronary artery of native heart without angina pectoris    OSA (obstructive sleep apnea)    Type 2 diabetes with complication ([place]) 70/26/3785   History of gout    Hypertensive heart disease    PAST MEDICAL HISTORY:  Active Ambulatory Problems    Diagnosis Date Noted   Type 2 diabetes with complication ([place]) 88/50/2774   History of gout    Hypertensive heart disease    OSA (obstructive sleep apnea)    Coronary artery disease involving native coronary artery of native heart without angina pectoris    Hyperlipidemia with target LDL less than 70 06/29/2012   Thrombocytopenia ([place]) 06/29/2012   Melena 08/14/2012   Hepatic cirrhosis ([place]) 02/27/2014   S/P CABG x 3 03/31/2014   Obesity (BMI 30.0-34.9) 02/05/2016   SOB (shortness of breath) 09/21/2017   Acute on chronic anemia 12/18/2017   Peptic ulcer disease 12/18/2017   Asthma, mild intermittent 12/87/8676   Alcoholic cirrhosis of liver with ascites ([place]) 12/27/2019   Pulmonary hypertension, unspecified ([place]) 03/11/2020   Pancytopenia ([place]) 04/15/2020   Iron deficiency anemia due to chronic blood loss    Routine general medical examination at a health care facility 05/06/2020   Hepatocellular carcinoma ([place]) 08/09/2020   Hypokalemia 07/23/2021   Lactic acidosis 09/08/2021   GERD (gastroesophageal reflux disease) 09/08/2021   Chronic diastolic CHF (congestive heart failure) ([place])    Generalized weakness    Primary lung  adenocarcinoma, right ([place]) 09/28/2021   Resolved Ambulatory Problems    Diagnosis Date Noted   GERD without esophagitis 72/04/4708   HERNIA, UMBILICAL 62/83/6629   RENAL CALCULUS, HX OF 08/04/2010   TOBACCO USE, QUIT 08/04/2010   SLEEP APNEA 09/15/2010   Edema    Abnormal stress echo 06/10/2012   Acute blood loss anemia 08/13/2012   Gastric ulcer 08/13/2012   Unstable angina ([place]) 03/27/2014   History of GI bleed    Edema 05/14/2014   Knee contusion 11/07/2014   Numerous moles 09/15/2015   Exertional dyspnea 02/05/2016   Subcutaneous cyst 04/17/2017   GI bleed 09/29/2017   AKI (acute kidney injury) ([place]) 10/24/2017   Cellulitis 11/20/2017   Upper GI bleed 12/18/2017   Neck pain 09/12/2018   Hyponatremia 12/24/2018   (HFpEF) heart failure with preserved ejection fraction ([place]) 12/24/2018   Precordial chest pain    Itching 03/22/2019   Skin lesion 03/22/2019   Volume overload 07/29/2019  Acute on chronic diastolic CHF (congestive heart failure) ([place]) 11/01/2019   COVID-19 virus infection 11/04/2019   Type 2 diabetes mellitus with complications ([place]) 49/70/2637   Acute on chronic diastolic (congestive) heart failure ([place]) 12/27/2019   Acute exacerbation of CHF (congestive heart failure) ([employer]) 12/27/2019   GI bleeding 09/10/2020   Hepatic encephalopathy 01/22/2021   Acute GI bleeding 02/27/2021   Acute on chronic anemia 04/28/2021   Acute upper GI bleed 04/29/2021   Cirrhosis ([place]) 04/29/2021   Acute hepatic encephalopathy 06/15/2021   Toxic encephalopathy 09/16/2021   Past Medical History:  Diagnosis Date   Arthritis    Carcinoma of liver, hepatocellular ([place]) 06/30/2020   Chronic lower back pain    Diastolic heart failure (HCC)    History of blood transfusion    History of concussion    History of gout    Hyperlipidemia    Iron deficiency anemia    Kidney stones    OA (osteoarthritis of spine)    Phimosis    Type 2 diabetes mellitus (HCC)    Unspecified  essential hypertension    SOCIAL HX:  Social History   Tobacco Use   Smoking status: Former    Packs/day: 2.00    Years: 40.00    Pack years: 80.00    Types: Cigarettes    Quit date: 08/29/1996    Years since quitting: 25.1   Smokeless tobacco: Never  Substance Use Topics   Alcohol use: Not Currently    Alcohol/week: 8.0 standard drinks    Types: 8 Cans of beer per week    Comment: 2 beers every other day   FAMILY HX:  Family History  Problem Relation Age of Onset   Lung cancer Sister    Cancer Sister        lung   Cancer Mother    Cancer Father        died in his 57s.   Cancer Brother        lung   Coronary artery disease Other    Diabetes Other    Colon cancer Other    Cancer Sister        lung      ALLERGIES:  Allergies  Allergen Reactions   Fluzone Quadrivalent [Influenza Vac Split Quad] Other (See Comments)    The patient stated, in 10/2020: "I am not taking any more flu shots. It liked to have killed me."     PERTINENT MEDICATIONS:  Outpatient Encounter Medications as of 10/04/2021  Medication Sig   Accu-Chek Softclix Lancets lancets USE AS DIRECTED TO TEST BLOOD SUGAR FOUR TIMES DAILY   albuterol (VENTOLIN HFA) 108 (90 Base) MCG/ACT inhaler Inhale 2 puffs into the lungs every 6 (six) hours as needed for wheezing or shortness of breath.   ANORO ELLIPTA 62.5-25 MCG/ACT AEPB Inhale 1 puff into the lungs daily.   blood glucose meter kit and supplies Dispense based on patient and insurance preference. Use up to four times daily as directed. (FOR ICD-10 E10.9, E11.9).   ferrous sulfate 325 (65 FE) MG tablet Take 1 tablet (325 mg total) by mouth daily with breakfast.   furosemide (LASIX) 40 MG tablet Take 1 tablet (40 mg total) by mouth daily.   lactulose (CHRONULAC) 10 GM/15ML solution Take 45 mLs (30 g total) by mouth 3 (three) times daily.   pantoprazole (PROTONIX) 40 MG tablet Take 1 tablet (40 mg total) by mouth 2 (two) times daily before a meal.   rifaximin  (XIFAXAN) 550  MG TABS tablet Take 1 tablet (550 mg total) by mouth 2 (two) times daily.   spironolactone (ALDACTONE) 50 MG tablet Take 1 tablet (50 mg total) by mouth daily.   No facility-administered encounter medications on file as of 10/04/2021.   Thank you for the opportunity to participate in the care of [name].  The palliative care team will continue to follow. Please call our office at [phone] if we can be of additional assistance.   Tram Wrenn, DO ,   COVID-19 PATIENT SCREENING TOOL Asked and negative response unless otherwise noted:  Have you had symptoms of covid, tested positive or been in contact with someone with symptoms/positive test in the past 5-10 days?

## 2021-10-04 NOTE — Telephone Encounter (Signed)
This RN spoke with son- [name]- per need for 1 unit PRBC's due to heme of 6.8 and review by Dr Seward Meth.  Arranged for transfusion tomorrow at 8 am.  Blood orders entered with attestation - for MD to sign.  MD notified.

## 2021-10-05 ENCOUNTER — Inpatient Hospital Stay: Payer: Medicare HMO

## 2021-10-05 DIAGNOSIS — D500 Iron deficiency anemia secondary to blood loss (chronic): Secondary | ICD-10-CM

## 2021-10-05 DIAGNOSIS — D61818 Other pancytopenia: Secondary | ICD-10-CM

## 2021-10-05 DIAGNOSIS — R918 Other nonspecific abnormal finding of lung field: Secondary | ICD-10-CM | POA: Diagnosis not present

## 2021-10-05 DIAGNOSIS — D696 Thrombocytopenia, unspecified: Secondary | ICD-10-CM

## 2021-10-05 DIAGNOSIS — C221 Intrahepatic bile duct carcinoma: Secondary | ICD-10-CM | POA: Diagnosis not present

## 2021-10-05 DIAGNOSIS — T40711D Poisoning by cannabis, accidental (unintentional), subsequent encounter: Secondary | ICD-10-CM | POA: Diagnosis not present

## 2021-10-05 DIAGNOSIS — E119 Type 2 diabetes mellitus without complications: Secondary | ICD-10-CM | POA: Diagnosis not present

## 2021-10-05 DIAGNOSIS — I5032 Chronic diastolic (congestive) heart failure: Secondary | ICD-10-CM | POA: Diagnosis not present

## 2021-10-05 DIAGNOSIS — K766 Portal hypertension: Secondary | ICD-10-CM | POA: Diagnosis not present

## 2021-10-05 DIAGNOSIS — D509 Iron deficiency anemia, unspecified: Secondary | ICD-10-CM | POA: Diagnosis not present

## 2021-10-05 DIAGNOSIS — Z87891 Personal history of nicotine dependence: Secondary | ICD-10-CM | POA: Diagnosis not present

## 2021-10-05 DIAGNOSIS — G928 Other toxic encephalopathy: Secondary | ICD-10-CM | POA: Diagnosis not present

## 2021-10-05 DIAGNOSIS — Z951 Presence of aortocoronary bypass graft: Secondary | ICD-10-CM | POA: Diagnosis not present

## 2021-10-05 DIAGNOSIS — M1000 Idiopathic gout, unspecified site: Secondary | ICD-10-CM | POA: Diagnosis not present

## 2021-10-05 DIAGNOSIS — R188 Other ascites: Secondary | ICD-10-CM | POA: Diagnosis not present

## 2021-10-05 DIAGNOSIS — I700 Atherosclerosis of aorta: Secondary | ICD-10-CM | POA: Diagnosis not present

## 2021-10-05 DIAGNOSIS — I864 Gastric varices: Secondary | ICD-10-CM | POA: Diagnosis not present

## 2021-10-05 DIAGNOSIS — R69 Illness, unspecified: Secondary | ICD-10-CM | POA: Diagnosis not present

## 2021-10-05 DIAGNOSIS — I110 Hypertensive heart disease with heart failure: Secondary | ICD-10-CM | POA: Diagnosis not present

## 2021-10-05 DIAGNOSIS — K279 Peptic ulcer, site unspecified, unspecified as acute or chronic, without hemorrhage or perforation: Secondary | ICD-10-CM | POA: Diagnosis not present

## 2021-10-05 DIAGNOSIS — K7460 Unspecified cirrhosis of liver: Secondary | ICD-10-CM | POA: Diagnosis not present

## 2021-10-05 DIAGNOSIS — K219 Gastro-esophageal reflux disease without esophagitis: Secondary | ICD-10-CM | POA: Diagnosis not present

## 2021-10-05 DIAGNOSIS — M1990 Unspecified osteoarthritis, unspecified site: Secondary | ICD-10-CM | POA: Diagnosis not present

## 2021-10-05 DIAGNOSIS — D61811 Other drug-induced pancytopenia: Secondary | ICD-10-CM | POA: Diagnosis not present

## 2021-10-05 DIAGNOSIS — J910 Malignant pleural effusion: Secondary | ICD-10-CM | POA: Diagnosis not present

## 2021-10-05 DIAGNOSIS — E785 Hyperlipidemia, unspecified: Secondary | ICD-10-CM | POA: Diagnosis not present

## 2021-10-05 DIAGNOSIS — I2510 Atherosclerotic heart disease of native coronary artery without angina pectoris: Secondary | ICD-10-CM | POA: Diagnosis not present

## 2021-10-05 DIAGNOSIS — K7030 Alcoholic cirrhosis of liver without ascites: Secondary | ICD-10-CM

## 2021-10-05 DIAGNOSIS — Z7951 Long term (current) use of inhaled steroids: Secondary | ICD-10-CM | POA: Diagnosis not present

## 2021-10-05 DIAGNOSIS — G4733 Obstructive sleep apnea (adult) (pediatric): Secondary | ICD-10-CM | POA: Diagnosis not present

## 2021-10-05 DIAGNOSIS — M545 Low back pain, unspecified: Secondary | ICD-10-CM | POA: Diagnosis not present

## 2021-10-05 DIAGNOSIS — N471 Phimosis: Secondary | ICD-10-CM | POA: Diagnosis not present

## 2021-10-05 DIAGNOSIS — Z8505 Personal history of malignant neoplasm of liver: Secondary | ICD-10-CM | POA: Diagnosis not present

## 2021-10-05 DIAGNOSIS — K745 Biliary cirrhosis, unspecified: Secondary | ICD-10-CM | POA: Diagnosis not present

## 2021-10-05 MED ORDER — DIPHENHYDRAMINE HCL 25 MG PO CAPS
25.0000 mg | ORAL_CAPSULE | Freq: Once | ORAL | Status: AC
  Administered 2021-10-05: 25 mg via ORAL
  Filled 2021-10-05: qty 1

## 2021-10-05 MED ORDER — ACETAMINOPHEN 325 MG PO TABS
650.0000 mg | ORAL_TABLET | Freq: Once | ORAL | Status: AC
  Administered 2021-10-05: 650 mg via ORAL
  Filled 2021-10-05: qty 2

## 2021-10-05 MED ORDER — SODIUM CHLORIDE 0.9% IV SOLUTION
250.0000 mL | Freq: Once | INTRAVENOUS | Status: AC
  Administered 2021-10-05: 250 mL via INTRAVENOUS

## 2021-10-05 NOTE — Patient Instructions (Signed)

## 2021-10-06 ENCOUNTER — Ambulatory Visit: Payer: 59 | Admitting: Hematology and Oncology

## 2021-10-06 ENCOUNTER — Other Ambulatory Visit: Payer: 59

## 2021-10-06 LAB — TYPE AND SCREEN
ABO/RH(D): O POS
Antibody Screen: NEGATIVE
Unit division: 0

## 2021-10-06 LAB — BPAM RBC
Blood Product Expiration Date: 202303082359
ISSUE DATE / TIME: 202302070831
Unit Type and Rh: 5100

## 2021-10-06 NOTE — Progress Notes (Signed)
Pt and family did not answer phone call

## 2021-10-07 ENCOUNTER — Encounter: Payer: Self-pay | Admitting: *Deleted

## 2021-10-07 NOTE — Progress Notes (Signed)
Oncology Nurse Navigator Documentation  Oncology Nurse Navigator Flowsheets 10/07/2021 09/29/2021 09/28/2021  Navigator Follow Up Date: 10/08/2021 10/06/2021 09/29/2021  Navigator Follow Up Reason: Awaiting Molecular Testing Pathology Review Note  Navigator [facility]  Navigator Encounter Type Pathology Review Other: Telephone  Telephone - - Outgoing Call  Patient Visit Type Other Other -  Treatment Phase Pre-Tx/Tx Discussion Other;Pre-Tx/Tx Discussion Other  Barriers/Navigation Needs Coordination of Care/I followed up on FO molecular and PDL 1 testing on the [facility] patient portal.  I did not see any results for Dr. Chryl Heck so I reached out to the rep for assistance.  Coordination of Care Education;Coordination of Care  Education - - Other  Interventions Coordination of Care Coordination of Care Coordination of Care;Education;Psycho-Social Support  Acuity Level 2-Minimal Needs (1-2 Barriers Identified) Level 2-Minimal Needs (1-2 Barriers Identified) Level 2-Minimal Needs (1-2 Barriers Identified)  Coordination of Care Pathology Pathology Other  Education Method - - Verbal  Time Spent with Patient 30 30 15

## 2021-10-08 ENCOUNTER — Telehealth: Payer: Self-pay | Admitting: Internal Medicine

## 2021-10-08 DIAGNOSIS — I2510 Atherosclerotic heart disease of native coronary artery without angina pectoris: Secondary | ICD-10-CM | POA: Diagnosis not present

## 2021-10-08 DIAGNOSIS — M545 Low back pain, unspecified: Secondary | ICD-10-CM | POA: Diagnosis not present

## 2021-10-08 DIAGNOSIS — R69 Illness, unspecified: Secondary | ICD-10-CM | POA: Diagnosis not present

## 2021-10-08 DIAGNOSIS — N471 Phimosis: Secondary | ICD-10-CM | POA: Diagnosis not present

## 2021-10-08 DIAGNOSIS — K219 Gastro-esophageal reflux disease without esophagitis: Secondary | ICD-10-CM | POA: Diagnosis not present

## 2021-10-08 DIAGNOSIS — I5032 Chronic diastolic (congestive) heart failure: Secondary | ICD-10-CM | POA: Diagnosis not present

## 2021-10-08 DIAGNOSIS — I700 Atherosclerosis of aorta: Secondary | ICD-10-CM | POA: Diagnosis not present

## 2021-10-08 DIAGNOSIS — I110 Hypertensive heart disease with heart failure: Secondary | ICD-10-CM | POA: Diagnosis not present

## 2021-10-08 DIAGNOSIS — E785 Hyperlipidemia, unspecified: Secondary | ICD-10-CM | POA: Diagnosis not present

## 2021-10-08 DIAGNOSIS — E119 Type 2 diabetes mellitus without complications: Secondary | ICD-10-CM | POA: Diagnosis not present

## 2021-10-08 DIAGNOSIS — I864 Gastric varices: Secondary | ICD-10-CM | POA: Diagnosis not present

## 2021-10-08 DIAGNOSIS — R188 Other ascites: Secondary | ICD-10-CM | POA: Diagnosis not present

## 2021-10-08 DIAGNOSIS — Z7951 Long term (current) use of inhaled steroids: Secondary | ICD-10-CM | POA: Diagnosis not present

## 2021-10-08 DIAGNOSIS — K745 Biliary cirrhosis, unspecified: Secondary | ICD-10-CM | POA: Diagnosis not present

## 2021-10-08 DIAGNOSIS — D509 Iron deficiency anemia, unspecified: Secondary | ICD-10-CM | POA: Diagnosis not present

## 2021-10-08 DIAGNOSIS — G4733 Obstructive sleep apnea (adult) (pediatric): Secondary | ICD-10-CM | POA: Diagnosis not present

## 2021-10-08 DIAGNOSIS — G928 Other toxic encephalopathy: Secondary | ICD-10-CM | POA: Diagnosis not present

## 2021-10-08 DIAGNOSIS — M1990 Unspecified osteoarthritis, unspecified site: Secondary | ICD-10-CM | POA: Diagnosis not present

## 2021-10-08 DIAGNOSIS — K766 Portal hypertension: Secondary | ICD-10-CM | POA: Diagnosis not present

## 2021-10-08 DIAGNOSIS — Z951 Presence of aortocoronary bypass graft: Secondary | ICD-10-CM | POA: Diagnosis not present

## 2021-10-08 DIAGNOSIS — Z8505 Personal history of malignant neoplasm of liver: Secondary | ICD-10-CM | POA: Diagnosis not present

## 2021-10-08 DIAGNOSIS — M1000 Idiopathic gout, unspecified site: Secondary | ICD-10-CM | POA: Diagnosis not present

## 2021-10-08 DIAGNOSIS — K279 Peptic ulcer, site unspecified, unspecified as acute or chronic, without hemorrhage or perforation: Secondary | ICD-10-CM | POA: Diagnosis not present

## 2021-10-08 DIAGNOSIS — D61811 Other drug-induced pancytopenia: Secondary | ICD-10-CM | POA: Diagnosis not present

## 2021-10-08 DIAGNOSIS — T40711D Poisoning by cannabis, accidental (unintentional), subsequent encounter: Secondary | ICD-10-CM | POA: Diagnosis not present

## 2021-10-08 NOTE — Telephone Encounter (Signed)
[name] informing provider pt is experiencing [place] r. side back pain x1w, caller states pt has been taking tylenol w/ little relief  Pts next ov is 10-12-2021

## 2021-10-11 ENCOUNTER — Telehealth: Payer: Self-pay

## 2021-10-11 NOTE — Telephone Encounter (Signed)
Attempted to contact patient's son [name] reschedule a Palliative Care consult appointment. No answer left a message to return call.

## 2021-10-11 NOTE — Telephone Encounter (Signed)
Fyi.

## 2021-10-12 ENCOUNTER — Encounter: Payer: Self-pay | Admitting: Internal Medicine

## 2021-10-12 ENCOUNTER — Other Ambulatory Visit: Payer: Self-pay

## 2021-10-12 ENCOUNTER — Ambulatory Visit (INDEPENDENT_AMBULATORY_CARE_PROVIDER_SITE_OTHER): Payer: Medicare HMO | Admitting: Internal Medicine

## 2021-10-12 ENCOUNTER — Other Ambulatory Visit: Payer: Self-pay | Admitting: Internal Medicine

## 2021-10-12 VITALS — BP 130/80 | HR 84 | Resp 18 | Ht 73.0 in | Wt 160.0 lb

## 2021-10-12 DIAGNOSIS — C3491 Malignant neoplasm of unspecified part of right bronchus or lung: Secondary | ICD-10-CM

## 2021-10-12 DIAGNOSIS — J910 Malignant pleural effusion: Secondary | ICD-10-CM | POA: Diagnosis not present

## 2021-10-12 MED ORDER — TRAMADOL HCL 50 MG PO TABS
50.0000 mg | ORAL_TABLET | Freq: Three times a day (TID) | ORAL | 0 refills | Status: AC | PRN
Start: 2021-10-12 — End: 2021-10-17

## 2021-10-12 MED ORDER — LIDOCAINE 5 % EX PTCH: 1.0000 | MEDICATED_PATCH | CUTANEOUS | 0 refills | Status: AC

## 2021-10-12 NOTE — Patient Instructions (Signed)
We will send in tramadol and lidocaine patches to help with the pain.  The tramadol is a pill you can take up to 3 times a day.  The lidocaine patch you can switch daily for pain.

## 2021-10-12 NOTE — Progress Notes (Signed)
° °  Subjective:   Patient ID: [name], male    DOB: 1945-12-11, 76 y.o.   MRN: [id number]  HPI The patient is a 76 YO man coming in for follow up and chest pain ongoing since thoracentesis.   Review of Systems  Constitutional:  Positive for activity change and appetite change.  HENT: Negative.    Eyes: Negative.   Respiratory:  Positive for chest tightness. Negative for cough and shortness of breath.   Cardiovascular:  Positive for chest pain. Negative for palpitations and leg swelling.  Gastrointestinal:  Positive for abdominal distention. Negative for abdominal pain, constipation, diarrhea, nausea and vomiting.  Musculoskeletal:  Positive for arthralgias and myalgias.  Skin: Negative.   Neurological: Negative.   Psychiatric/Behavioral: Negative.     Objective:  Physical Exam Constitutional:      Appearance: He is well-developed. He is ill-appearing.  HENT:     Head: Normocephalic and atraumatic.  Cardiovascular:     Rate and Rhythm: Normal rate and regular rhythm.  Pulmonary:     Effort: Pulmonary effort is normal. No respiratory distress.     Breath sounds: Normal breath sounds. No wheezing or rales.  Abdominal:     General: Bowel sounds are normal. There is distension.     Palpations: Abdomen is soft.     Tenderness: There is no abdominal tenderness. There is no rebound.  Musculoskeletal:     Cervical back: Normal range of motion.  Skin:    General: Skin is warm and dry.  Neurological:     Mental Status: He is alert and oriented to person, place, and time.     Coordination: Coordination abnormal.    Vitals:   10/12/21 1408  BP: 130/80  Pulse: 84  Resp: 18  SpO2: 100%  Weight: 160 lb (72.6 kg)  Height: 6\' 1"  (1.854 m)    This visit occurred during the SARS-CoV-2 public health emergency.  Safety protocols were in place, including screening questions prior to the visit, additional usage of staff PPE, and extensive cleaning of exam room while observing  appropriate contact time as indicated for disinfecting solutions.   Assessment & Plan:

## 2021-10-13 ENCOUNTER — Ambulatory Visit (INDEPENDENT_AMBULATORY_CARE_PROVIDER_SITE_OTHER): Payer: Medicare HMO | Admitting: Pulmonary Disease

## 2021-10-13 ENCOUNTER — Encounter: Payer: Self-pay | Admitting: Pulmonary Disease

## 2021-10-13 DIAGNOSIS — G4733 Obstructive sleep apnea (adult) (pediatric): Secondary | ICD-10-CM | POA: Diagnosis not present

## 2021-10-13 DIAGNOSIS — E785 Hyperlipidemia, unspecified: Secondary | ICD-10-CM | POA: Diagnosis not present

## 2021-10-13 DIAGNOSIS — I5032 Chronic diastolic (congestive) heart failure: Secondary | ICD-10-CM | POA: Diagnosis not present

## 2021-10-13 DIAGNOSIS — D61811 Other drug-induced pancytopenia: Secondary | ICD-10-CM | POA: Diagnosis not present

## 2021-10-13 DIAGNOSIS — M545 Low back pain, unspecified: Secondary | ICD-10-CM | POA: Diagnosis not present

## 2021-10-13 DIAGNOSIS — M1000 Idiopathic gout, unspecified site: Secondary | ICD-10-CM | POA: Diagnosis not present

## 2021-10-13 DIAGNOSIS — K219 Gastro-esophageal reflux disease without esophagitis: Secondary | ICD-10-CM | POA: Diagnosis not present

## 2021-10-13 DIAGNOSIS — G928 Other toxic encephalopathy: Secondary | ICD-10-CM | POA: Diagnosis not present

## 2021-10-13 DIAGNOSIS — K7031 Alcoholic cirrhosis of liver with ascites: Secondary | ICD-10-CM | POA: Diagnosis not present

## 2021-10-13 DIAGNOSIS — I2510 Atherosclerotic heart disease of native coronary artery without angina pectoris: Secondary | ICD-10-CM | POA: Diagnosis not present

## 2021-10-13 DIAGNOSIS — J90 Pleural effusion, not elsewhere classified: Secondary | ICD-10-CM

## 2021-10-13 DIAGNOSIS — I700 Atherosclerosis of aorta: Secondary | ICD-10-CM | POA: Diagnosis not present

## 2021-10-13 DIAGNOSIS — D509 Iron deficiency anemia, unspecified: Secondary | ICD-10-CM | POA: Diagnosis not present

## 2021-10-13 DIAGNOSIS — M1990 Unspecified osteoarthritis, unspecified site: Secondary | ICD-10-CM | POA: Diagnosis not present

## 2021-10-13 DIAGNOSIS — T40711D Poisoning by cannabis, accidental (unintentional), subsequent encounter: Secondary | ICD-10-CM | POA: Diagnosis not present

## 2021-10-13 DIAGNOSIS — J910 Malignant pleural effusion: Secondary | ICD-10-CM

## 2021-10-13 DIAGNOSIS — I110 Hypertensive heart disease with heart failure: Secondary | ICD-10-CM | POA: Diagnosis not present

## 2021-10-13 DIAGNOSIS — Z951 Presence of aortocoronary bypass graft: Secondary | ICD-10-CM | POA: Diagnosis not present

## 2021-10-13 DIAGNOSIS — R69 Illness, unspecified: Secondary | ICD-10-CM | POA: Diagnosis not present

## 2021-10-13 DIAGNOSIS — K766 Portal hypertension: Secondary | ICD-10-CM | POA: Diagnosis not present

## 2021-10-13 DIAGNOSIS — E119 Type 2 diabetes mellitus without complications: Secondary | ICD-10-CM | POA: Diagnosis not present

## 2021-10-13 DIAGNOSIS — I864 Gastric varices: Secondary | ICD-10-CM | POA: Diagnosis not present

## 2021-10-13 DIAGNOSIS — Z8505 Personal history of malignant neoplasm of liver: Secondary | ICD-10-CM | POA: Diagnosis not present

## 2021-10-13 DIAGNOSIS — K279 Peptic ulcer, site unspecified, unspecified as acute or chronic, without hemorrhage or perforation: Secondary | ICD-10-CM | POA: Diagnosis not present

## 2021-10-13 DIAGNOSIS — R188 Other ascites: Secondary | ICD-10-CM | POA: Diagnosis not present

## 2021-10-13 DIAGNOSIS — Z7951 Long term (current) use of inhaled steroids: Secondary | ICD-10-CM | POA: Diagnosis not present

## 2021-10-13 DIAGNOSIS — K745 Biliary cirrhosis, unspecified: Secondary | ICD-10-CM | POA: Diagnosis not present

## 2021-10-13 DIAGNOSIS — N471 Phimosis: Secondary | ICD-10-CM | POA: Diagnosis not present

## 2021-10-13 NOTE — Patient Instructions (Addendum)
° °  X CXR for pleural effusion  Continue on Anoro

## 2021-10-13 NOTE — Assessment & Plan Note (Signed)
He remains lethargic today in spite of taking lactulose and is at considerable risk for relapse of his hepatic encephalopathy

## 2021-10-13 NOTE — Progress Notes (Signed)
Subjective:    Patient ID: [name], male    DOB: 1945/12/06, 76 y.o.   MRN: [id number]  HPI  76 year old ex-smoker presents to establish care for right malignant effusion   He was admitted 1/11 with generalized weakness and found to have hemoglobin of 5.2.  He was transfused 2 units PRBC, managed conservatively.  PMH - EtOH cirrhosis of the liver with ascites status post TI PS procedure and embolization of gastric varices.  Last EGD 05/18/2021 by Dr. [name] has shown portal gastropathy, gastric varices without bleeding, multiple gastric AVMs. -hepatic encephalopathy with multiple prior admissions and is maintained on rifaximin and lactulose.   - hepatocellular carcinoma status post coil embolization   CT abdomen showed right pleural effusion and a rounded area of consolidation in the posterior right lower lobe. CT chest without contrast 1/13 showed right lower lobe masslike consolidation with new lymphadenopathy, paratracheal and subcarinal is also old calcified mediastinal lymphadenopathy.  Bedside thoracentesis was performed with removal of 1300 cc of fluid Cytology showed malignant cells consistent with adenocarcinoma He has seen oncology and I reviewed their consultation    He quit smoking 30 years ago when his son was born.  Smoked about 20 pack years.  He quit alcohol 4 years ago  He arrives accompanied by his son [name] today.  Additional history is obtained from his son.  He has been in declining health over the past year.  He has lost considerable weight, is compliant with lactulose but still remains sleepy during the day He is compliant with Anoro for his breathing  Review of Systems  Past Medical History:  Diagnosis Date   Arthritis    "joints tighten up sometimes" (03/27/2104)   Carcinoma of liver, hepatocellular ([place]) 06/30/2020   Chronic lower back pain    Coronary artery disease involving native coronary artery of native heart without angina pectoris    Severe  left main disease at catheterization July 2015  CABG x3 with a LIMA to the LAD, SVG to the OM, SVG to the PDA on 03/31/14. EF 60% by cath.    Diastolic heart failure (HCC)    GERD without esophagitis 08/04/2010   Hepatic cirrhosis ([place])    a. Dx 01/2014 - CT a/p    History of blood transfusion    "related to bleeding ulcers"   History of concussion    1976--  NO RESIDUAL   History of GI bleed    a. UGIB 07/2012;  b. 01/2014 admission with GIB/FOB stool req 1U prbc's->EGD showed portal gastropathy, barrett's esoph, and chronic active h. pylori gastritis.   History of gout    2007 &  2008  LEFT LEG-- NO ISSUE SINCE   Hyperlipidemia    Iron deficiency anemia    Kidney stones    OA (osteoarthritis of spine)    LOWER BACK--  INTERMITTANT LEFT LEG NUMBNESS   OSA (obstructive sleep apnea)    PULMOLOGIST-  DR CLANCE--  MODERATE OSA  STARTED CPAP 2012--  BUT CURRENTLY HAS NOT USED PAST 6 MONTHS   Phimosis    a. s/p circumcision 2015.   Type 2 diabetes mellitus ([place])    Unspecified essential hypertension     Past Surgical History:  Procedure Laterality Date   ANKLE FRACTURE SURGERY Right 1989   "plate put in"   APPENDECTOMY  05-16-2004   open   BIOPSY  01/08/2021   Procedure: BIOPSY;  Surgeon: Wilford Corner, MD;  Location: [facility];  Service: Endoscopy;;  BIOPSY  05/02/2021   Procedure: BIOPSY;  Surgeon: Arta Silence, MD;  Location: [facility];  Service: Endoscopy;;   CIRCUMCISION N/A 09/09/2013   Procedure: CIRCUMCISION ADULT;  Surgeon: Bernestine Amass, MD;  Location: [facility];  Service: Urology;  Laterality: N/A;   COLECTOMY  05-16-2004   COLONOSCOPY N/A 05/02/2021   Procedure: COLONOSCOPY;  Surgeon: Arta Silence, MD;  Location: [facility];  Service: Endoscopy;  Laterality: N/A;   COLONOSCOPY WITH PROPOFOL N/A 11/20/2020   Procedure: COLONOSCOPY WITH PROPOFOL;  Surgeon: Ronald Lobo, MD;  Location: [facility];  Service: Endoscopy;  Laterality: N/A;    CORONARY ANGIOPLASTY WITH STENT PLACEMENT  06/28/2012  DR COOPER   PCI W/  X1 DES to [place]. LAD/  LM  40% OSTIAL & 50-60% DISTAL /  50% PROX LCX/  30-40% PROX RCA & 50% MID RCA/   LVEF 65-70%   CORONARY ARTERY BYPASS GRAFT N/A 03/31/2014   Procedure: CORONARY ARTERY BYPASS GRAFTING (CABG) times 3 using left internal mammary artery and right saphenous vein.;  Surgeon: Melrose Nakayama, MD;  Location: [place];  Service: Open Heart Surgery;  Laterality: N/A;   DOBUTAMINE STRESS ECHO  06-08-2012   MODERATE HYPOKINESIS/ ISCHEMIA MID INFERIOR WALL   ESOPHAGOGASTRODUODENOSCOPY  08/15/2012   Procedure: ESOPHAGOGASTRODUODENOSCOPY (EGD);  Surgeon: Wonda Horner, MD;  Location: [facility] ENDOSCOPY;  Service: Endoscopy;  Laterality: N/A;   ESOPHAGOGASTRODUODENOSCOPY N/A 02/17/2014   Procedure: ESOPHAGOGASTRODUODENOSCOPY (EGD);  Surgeon: Jeryl Columbia, MD;  [facility]COPY;  Service: Endoscopy;  Laterality: N/A;   ESOPHAGOGASTRODUODENOSCOPY N/A 04/17/2020   Procedure: ESOPHAGOGASTRODUODENOSCOPY (EGD);  Surgeon: Ronnette Juniper, MD;  Location: [place];  Service: Gastroenterology;  Laterality: N/A;   ESOPHAGOGASTRODUODENOSCOPY N/A 09/11/2020   Procedure: ESOPHAGOGASTRODUODENOSCOPY (EGD);  Surgeon: Clarene Essex, MD;  Location: [facility];  Service: Endoscopy;  Laterality: N/A;   ESOPHAGOGASTRODUODENOSCOPY  10/09/2020   ESOPHAGOGASTRODUODENOSCOPY N/A 10/09/2020   Procedure: ESOPHAGOGASTRODUODENOSCOPY (EGD);  Surgeon: Otis Brace, MD;  Location: [facility] ENDOSCOPY;  Service: Gastroenterology;  Laterality: N/A;   ESOPHAGOGASTRODUODENOSCOPY N/A 01/08/2021   Procedure: ESOPHAGOGASTRODUODENOSCOPY (EGD);  Surgeon: Wilford Corner, MD;  Location: [facility];  Service: Endoscopy;  Laterality: N/A;   ESOPHAGOGASTRODUODENOSCOPY N/A 05/02/2021   Procedure: ESOPHAGOGASTRODUODENOSCOPY (EGD);  Surgeon: Arta Silence, MD;  Location: [facility];  Service: Endoscopy;  Laterality: N/A;   ESOPHAGOGASTRODUODENOSCOPY (EGD) WITH  PROPOFOL N/A 09/30/2017   Procedure: ESOPHAGOGASTRODUODENOSCOPY (EGD) WITH PROPOFOL;  Surgeon: Wonda Horner, MD;  Location: [facility] ENDOSCOPY;  Service: Endoscopy;  Laterality: N/A;   ESOPHAGOGASTRODUODENOSCOPY (EGD) WITH PROPOFOL N/A 12/20/2017   Procedure: ESOPHAGOGASTRODUODENOSCOPY (EGD) WITH PROPOFOL;  Surgeon: Clarene Essex, MD;  Location: [place];  Service: Endoscopy;  Laterality: N/A;   ESOPHAGOGASTRODUODENOSCOPY (EGD) WITH PROPOFOL N/A 08/10/2020   Procedure: ESOPHAGOGASTRODUODENOSCOPY (EGD) WITH PROPOFOL;  Surgeon: Wilford Corner, MD;  Location: [place];  Service: Endoscopy;  Laterality: N/A;   ESOPHAGOGASTRODUODENOSCOPY (EGD) WITH PROPOFOL N/A 11/20/2020   Procedure: ESOPHAGOGASTRODUODENOSCOPY (EGD) WITH PROPOFOL;  Surgeon: Ronald Lobo, MD;  [facility]: [facility];  Service: Endoscopy;  Laterality: N/A;   ESOPHAGOGASTRODUODENOSCOPY (EGD) WITH PROPOFOL N/A 02/28/2021   Procedure: ESOPHAGOGASTRODUODENOSCOPY (EGD) WITH PROPOFOL;  Surgeon: Clarene Essex, MD;  Location: [facility];  Service: Endoscopy;  Laterality: N/A;   GIVENS CAPSULE STUDY N/A 11/20/2020   Procedure: GIVENS CAPSULE STUDY;  Surgeon: Ronald Lobo, MD;  Location: [facility];  Service: Endoscopy;  Laterality: N/A;   HEMOSTASIS CLIP PLACEMENT  05/02/2021   Procedure: HEMOSTASIS CLIP PLACEMENT;  Surgeon: Arta Silence, MD;  Location: [facility];  Service: Endoscopy;;  HERNIA REPAIR     INTRAOPERATIVE TRANSESOPHAGEAL ECHOCARDIOGRAM N/A 03/31/2014   Procedure: INTRAOPERATIVE TRANSESOPHAGEAL ECHOCARDIOGRAM;  Surgeon: Melrose Nakayama, MD;  Location: [place];  Service: Open Heart Surgery;  Laterality: N/A;   IR ANGIOGRAM SELECTIVE EACH ADDITIONAL VESSEL  06/16/2020   IR ANGIOGRAM SELECTIVE EACH ADDITIONAL VESSEL  06/16/2020   IR ANGIOGRAM SELECTIVE EACH ADDITIONAL VESSEL  06/16/2020   IR ANGIOGRAM SELECTIVE EACH ADDITIONAL VESSEL  06/16/2020   IR ANGIOGRAM SELECTIVE EACH ADDITIONAL VESSEL  06/16/2020   IR ANGIOGRAM  SELECTIVE EACH ADDITIONAL VESSEL  06/16/2020   IR ANGIOGRAM SELECTIVE EACH ADDITIONAL VESSEL  06/16/2020   IR ANGIOGRAM SELECTIVE EACH ADDITIONAL VESSEL  06/16/2020   IR ANGIOGRAM SELECTIVE EACH ADDITIONAL VESSEL  06/16/2020   IR ANGIOGRAM SELECTIVE EACH ADDITIONAL VESSEL  06/30/2020   IR ANGIOGRAM SELECTIVE EACH ADDITIONAL VESSEL  06/30/2020   IR ANGIOGRAM VISCERAL SELECTIVE  06/16/2020   IR ANGIOGRAM VISCERAL SELECTIVE  06/30/2020   IR EMBO ARTERIAL NOT HEMORR HEMANG INC GUIDE ROADMAPPING  06/16/2020   IR EMBO TUMOR ORGAN ISCHEMIA INFARCT INC GUIDE ROADMAPPING  06/30/2020   IR EMBO VENOUS NOT HEMORR HEMANG  INC GUIDE ROADMAPPING  01/13/2021   IR IVUS EACH ADDITIONAL NON CORONARY VESSEL  01/13/2021   IR RADIOLOGIST EVAL & MGMT  05/06/2020   IR RADIOLOGIST EVAL & MGMT  05/20/2020   IR RADIOLOGIST EVAL & MGMT  05/25/2021   IR RADIOLOGIST EVAL & MGMT  09/22/2021   IR TIPS  01/13/2021   IR US GUIDE VASC ACCESS RIGHT  06/16/2020   IR US GUIDE VASC ACCESS RIGHT  06/30/2020   IR US GUIDE VASC ACCESS RIGHT  01/13/2021   LAPAROSCOPIC UMBILICAL HERNIA REPAIR W/ MESH  06-06-2011   LEFT HEART CATHETERIZATION WITH CORONARY ANGIOGRAM N/A 03/28/2014   Procedure: LEFT HEART CATHETERIZATION WITH CORONARY ANGIOGRAM;  Surgeon: Sinclair Grooms, MD;  Location[facility] CATH LAB;  Service: Cardiovascular;  Laterality: N/A;   LIPOMA EXCISION Left 08/25/2017   Procedure: EXCISION LIPOMA LEFT POSTERIOR THIGH;  Surgeon: Clovis Riley, MD;  Location: [place];  Service: General;  Laterality: Left;   NEPHROLITHOTOMY  1990'S   OPEN APPENDECTOMY W/ PARTIAL CECECTOMY  05-16-2004   PERCUTANEOUS CORONARY STENT INTERVENTION (PCI-S) N/A 06/28/2012   Procedure: PERCUTANEOUS CORONARY STENT INTERVENTION (PCI-S);  Surgeon: Sherren Mocha, MD;  Location: [facility] CATH LAB;  Service: Cardiovascular;  Laterality: N/A;   POLYPECTOMY  11/20/2020   Procedure: POLYPECTOMY;  Surgeon: Ronald Lobo, MD;  [facility]: [facility];  Service: Endoscopy;;    POLYPECTOMY  05/02/2021   Procedure: POLYPECTOMY;  Surgeon: Arta Silence, MD;  Location: [facility];  Service: Endoscopy;;   RADIOLOGY WITH ANESTHESIA N/A 01/13/2021   Procedure: IR WITH ANESTHESIA - TIPS;  Surgeon: Suzette Battiest, MD;  Location: [place];  Service: Radiology;  Laterality: N/A;    Allergies  Allergen Reactions   Fluzone Quadrivalent [Influenza Vac Split Quad] Other (See Comments)    The patient stated, in 10/2020: "I am not taking any more flu shots. It liked to have killed me."     Social History   Socioeconomic History   Marital status: Married    Spouse name: Not on file   Number of children: Not on file   Years of education: Not on file   Highest education level: Not on file  Occupational History   Not on file  Tobacco Use   Smoking status: Former    Packs/day: 2.00    Years: 40.00    Pack  years: 80.00    Types: Cigarettes    Quit date: 08/29/1996    Years since quitting: 25.1   Smokeless tobacco: Never  Vaping Use   Vaping Use: Never used  Substance and Sexual Activity   Alcohol use: Not Currently    Alcohol/week: 8.0 standard drinks    Types: 8 Cans of beer per week    Comment: 2 beers every other day   Drug use: No   Sexual activity: Yes  Other Topics Concern   Not on file  Social History Narrative   Lives in [place] with wife, son, and son's family.   Social Determinants of Health   Financial Resource Strain: Medium Risk   Difficulty of Paying Living Expenses: Somewhat hard  Food Insecurity: No Food Insecurity   Worried About Charity fundraiser in the Last Year: Never true   Ran Out of Food in the Last Year: Never true  Transportation Needs: No Transportation Needs   Lack of Transportation (Medical): No   Lack of Transportation (Non-Medical): No  Physical Activity: Not on file  Stress: Not on file  Social Connections: Not on file  Intimate Partner Violence: Not on file    Family History  Problem Relation Age of Onset   Lung cancer  Sister    Cancer Sister        lung   Cancer Mother    Cancer Father        died in his 29s.   Cancer Brother        lung   Coronary artery disease Other    Diabetes Other    Colon cancer Other    Cancer Sister        lung      neg for any significant sore throat, dysphagia, itching, sneezing, nasal congestion or excess/ purulent secretions, fever, chills, sweats, unintended wt loss, pleuritic or exertional cp, hempoptysis, orthopnea pnd or change in chronic leg swelling. Also denies presyncope, palpitations, heartburn, abdominal pain, nausea, vomiting, diarrhea or change in bowel or urinary habits, dysuria,hematuria, rash, arthralgias, visual complaints, headache, numbness weakness or ataxia.    Objective:   Physical Exam  Gen. Pleasant, well-nourished, in no distress, lethargic, sleepy affect ENT - no pallor,icterus, no post nasal drip Neck: No JVD, no thyromegaly, no carotid bruits Lungs: no use of accessory muscles, no dullness to percussion, clear without rales or rhonchi  Cardiovascular: Rhythm regular, heart sounds  normal, no murmurs or gallops, no peripheral edema Abdomen: soft and non-tender, no hepatosplenomegaly, BS normal. Musculoskeletal: No deformities, no cyanosis or clubbing Neuro:  alert, non focal, no asterixis       Assessment & Plan:    COPD - refills on anoro

## 2021-10-13 NOTE — Assessment & Plan Note (Signed)
He has malignant pleural effusion showing adenocarcinoma.  Concern here is for new lung primary rather than metastatic liver cancer. PET scan is planned for tomorrow.  I doubt he needs any further biopsy.  He does not seem to be a candidate for systemic chemotherapy.  Foundation 1 testing is pending and has been ordered by oncology, based on this they can decide if he would be eligible for an oral regimen which she may tolerate better. Alternative would be palliative radiation. Patient and son were amenable for this discussion and they would be amenable to agreeing to hospice therapy depending on the prognosis that is given.

## 2021-10-14 ENCOUNTER — Other Ambulatory Visit: Payer: Self-pay

## 2021-10-14 ENCOUNTER — Encounter (HOSPITAL_COMMUNITY)
Admission: RE | Admit: 2021-10-14 | Discharge: 2021-10-14 | Disposition: A | Discharge status: Home / Self Care | Payer: Medicare HMO | Source: Ambulatory Visit | Attending: Pulmonary Disease | Admitting: Pulmonary Disease

## 2021-10-14 DIAGNOSIS — N200 Calculus of kidney: Secondary | ICD-10-CM | POA: Diagnosis not present

## 2021-10-14 DIAGNOSIS — C3491 Malignant neoplasm of unspecified part of right bronchus or lung: Secondary | ICD-10-CM | POA: Diagnosis not present

## 2021-10-14 DIAGNOSIS — Z8505 Personal history of malignant neoplasm of liver: Secondary | ICD-10-CM | POA: Diagnosis not present

## 2021-10-14 DIAGNOSIS — R918 Other nonspecific abnormal finding of lung field: Secondary | ICD-10-CM | POA: Insufficient documentation

## 2021-10-14 DIAGNOSIS — J90 Pleural effusion, not elsewhere classified: Secondary | ICD-10-CM | POA: Diagnosis not present

## 2021-10-14 DIAGNOSIS — J984 Other disorders of lung: Secondary | ICD-10-CM | POA: Diagnosis not present

## 2021-10-14 LAB — GLUCOSE, CAPILLARY: Glucose-Capillary: 88 mg/dL (ref 70–99)

## 2021-10-14 MED ORDER — FLUDEOXYGLUCOSE F - 18 (FDG) INJECTION
8.3000 | Freq: Once | INTRAVENOUS | Status: AC | PRN
  Administered 2021-10-14: 8.3 via INTRAVENOUS

## 2021-10-15 DIAGNOSIS — E785 Hyperlipidemia, unspecified: Secondary | ICD-10-CM | POA: Diagnosis not present

## 2021-10-15 DIAGNOSIS — K219 Gastro-esophageal reflux disease without esophagitis: Secondary | ICD-10-CM | POA: Diagnosis not present

## 2021-10-15 DIAGNOSIS — E119 Type 2 diabetes mellitus without complications: Secondary | ICD-10-CM | POA: Diagnosis not present

## 2021-10-15 DIAGNOSIS — I2510 Atherosclerotic heart disease of native coronary artery without angina pectoris: Secondary | ICD-10-CM | POA: Diagnosis not present

## 2021-10-15 DIAGNOSIS — I5032 Chronic diastolic (congestive) heart failure: Secondary | ICD-10-CM | POA: Diagnosis not present

## 2021-10-15 DIAGNOSIS — Z8505 Personal history of malignant neoplasm of liver: Secondary | ICD-10-CM | POA: Diagnosis not present

## 2021-10-15 DIAGNOSIS — I700 Atherosclerosis of aorta: Secondary | ICD-10-CM | POA: Diagnosis not present

## 2021-10-15 DIAGNOSIS — K766 Portal hypertension: Secondary | ICD-10-CM | POA: Diagnosis not present

## 2021-10-15 DIAGNOSIS — K279 Peptic ulcer, site unspecified, unspecified as acute or chronic, without hemorrhage or perforation: Secondary | ICD-10-CM | POA: Diagnosis not present

## 2021-10-15 DIAGNOSIS — M545 Low back pain, unspecified: Secondary | ICD-10-CM | POA: Diagnosis not present

## 2021-10-15 DIAGNOSIS — T40711D Poisoning by cannabis, accidental (unintentional), subsequent encounter: Secondary | ICD-10-CM | POA: Diagnosis not present

## 2021-10-15 DIAGNOSIS — I110 Hypertensive heart disease with heart failure: Secondary | ICD-10-CM | POA: Diagnosis not present

## 2021-10-15 DIAGNOSIS — M1990 Unspecified osteoarthritis, unspecified site: Secondary | ICD-10-CM | POA: Diagnosis not present

## 2021-10-15 DIAGNOSIS — R69 Illness, unspecified: Secondary | ICD-10-CM | POA: Diagnosis not present

## 2021-10-15 DIAGNOSIS — R188 Other ascites: Secondary | ICD-10-CM | POA: Diagnosis not present

## 2021-10-15 DIAGNOSIS — I864 Gastric varices: Secondary | ICD-10-CM | POA: Diagnosis not present

## 2021-10-15 DIAGNOSIS — Z7951 Long term (current) use of inhaled steroids: Secondary | ICD-10-CM | POA: Diagnosis not present

## 2021-10-15 DIAGNOSIS — D61811 Other drug-induced pancytopenia: Secondary | ICD-10-CM | POA: Diagnosis not present

## 2021-10-15 DIAGNOSIS — M1000 Idiopathic gout, unspecified site: Secondary | ICD-10-CM | POA: Diagnosis not present

## 2021-10-15 DIAGNOSIS — G928 Other toxic encephalopathy: Secondary | ICD-10-CM | POA: Diagnosis not present

## 2021-10-15 DIAGNOSIS — G4733 Obstructive sleep apnea (adult) (pediatric): Secondary | ICD-10-CM | POA: Diagnosis not present

## 2021-10-15 DIAGNOSIS — D509 Iron deficiency anemia, unspecified: Secondary | ICD-10-CM | POA: Diagnosis not present

## 2021-10-15 DIAGNOSIS — N471 Phimosis: Secondary | ICD-10-CM | POA: Diagnosis not present

## 2021-10-15 DIAGNOSIS — Z951 Presence of aortocoronary bypass graft: Secondary | ICD-10-CM | POA: Diagnosis not present

## 2021-10-15 DIAGNOSIS — K745 Biliary cirrhosis, unspecified: Secondary | ICD-10-CM | POA: Diagnosis not present

## 2021-10-15 NOTE — Assessment & Plan Note (Signed)
Pain from thoracentesis and PET scan upcoming. Rx tramadol. He cannot take NSAIDs and limited tylenol due to kidney and liver disease.

## 2021-10-18 ENCOUNTER — Other Ambulatory Visit (HOSPITAL_COMMUNITY): Payer: Self-pay | Admitting: Physician Assistant

## 2021-10-18 ENCOUNTER — Other Ambulatory Visit: Payer: Self-pay | Admitting: Radiology

## 2021-10-19 ENCOUNTER — Other Ambulatory Visit: Payer: Self-pay

## 2021-10-19 ENCOUNTER — Ambulatory Visit (HOSPITAL_COMMUNITY)
Admission: RE | Admit: 2021-10-19 | Discharge: 2021-10-19 | Disposition: A | Discharge status: Home / Self Care | Payer: Medicare HMO | Source: Ambulatory Visit | Attending: Student | Admitting: Student

## 2021-10-19 ENCOUNTER — Encounter (HOSPITAL_COMMUNITY): Payer: Self-pay

## 2021-10-19 DIAGNOSIS — I110 Hypertensive heart disease with heart failure: Secondary | ICD-10-CM | POA: Diagnosis not present

## 2021-10-19 DIAGNOSIS — D649 Anemia, unspecified: Secondary | ICD-10-CM | POA: Insufficient documentation

## 2021-10-19 DIAGNOSIS — I5032 Chronic diastolic (congestive) heart failure: Secondary | ICD-10-CM | POA: Insufficient documentation

## 2021-10-19 DIAGNOSIS — D61818 Other pancytopenia: Secondary | ICD-10-CM | POA: Diagnosis not present

## 2021-10-19 DIAGNOSIS — Z8505 Personal history of malignant neoplasm of liver: Secondary | ICD-10-CM | POA: Diagnosis not present

## 2021-10-19 DIAGNOSIS — Z95828 Presence of other vascular implants and grafts: Secondary | ICD-10-CM | POA: Diagnosis not present

## 2021-10-19 DIAGNOSIS — K7460 Unspecified cirrhosis of liver: Secondary | ICD-10-CM | POA: Diagnosis not present

## 2021-10-19 DIAGNOSIS — I2510 Atherosclerotic heart disease of native coronary artery without angina pectoris: Secondary | ICD-10-CM | POA: Diagnosis not present

## 2021-10-19 DIAGNOSIS — Z87891 Personal history of nicotine dependence: Secondary | ICD-10-CM | POA: Insufficient documentation

## 2021-10-19 DIAGNOSIS — K922 Gastrointestinal hemorrhage, unspecified: Secondary | ICD-10-CM | POA: Insufficient documentation

## 2021-10-19 DIAGNOSIS — G4733 Obstructive sleep apnea (adult) (pediatric): Secondary | ICD-10-CM | POA: Diagnosis not present

## 2021-10-19 DIAGNOSIS — E119 Type 2 diabetes mellitus without complications: Secondary | ICD-10-CM | POA: Insufficient documentation

## 2021-10-19 HISTORY — PX: IR TIPS REVISION MOD SED: IMG2296

## 2021-10-19 LAB — COMPREHENSIVE METABOLIC PANEL
ALT: 27 U/L (ref 0–44)
AST: 53 U/L — ABNORMAL HIGH (ref 15–41)
Albumin: 2.1 g/dL — ABNORMAL LOW (ref 3.5–5.0)
Alkaline Phosphatase: 293 U/L — ABNORMAL HIGH (ref 38–126)
Anion gap: 9 (ref 5–15)
BUN: 12 mg/dL (ref 8–23)
CO2: 26 mmol/L (ref 22–32)
Calcium: 8.2 mg/dL — ABNORMAL LOW (ref 8.9–10.3)
Chloride: 98 mmol/L (ref 98–111)
Creatinine, Ser: 1.07 mg/dL (ref 0.61–1.24)
GFR, Estimated: >60 mL/min (ref >60)
Glucose, Bld: 104 mg/dL — ABNORMAL HIGH (ref 70–99)
Potassium: 3.5 mmol/L (ref 3.5–5.1)
Sodium: 133 mmol/L — ABNORMAL LOW (ref 135–145)
Total Bilirubin: 1.9 mg/dL — ABNORMAL HIGH (ref 0.3–1.2)
Total Protein: 6.3 g/dL — ABNORMAL LOW (ref 6.5–8.1)

## 2021-10-19 LAB — CBC
HCT: 30.1 % — ABNORMAL LOW (ref 39.0–52.0)
Hemoglobin: 9.3 g/dL — ABNORMAL LOW (ref 13.0–17.0)
MCH: 25.6 pg — ABNORMAL LOW (ref 26.0–34.0)
MCHC: 30.9 g/dL (ref 30.0–36.0)
MCV: 82.9 fL (ref 80.0–100.0)
Platelets: 162 10*3/uL (ref 150–400)
RBC: 3.63 MIL/uL — ABNORMAL LOW (ref 4.22–5.81)
RDW: 19 % — ABNORMAL HIGH (ref 11.5–15.5)
WBC: 8 10*3/uL (ref 4.0–10.5)
nRBC: 0 % (ref 0.0–0.2)

## 2021-10-19 LAB — PROTIME-INR
INR: 1.6 — ABNORMAL HIGH (ref 0.8–1.2)
Prothrombin Time: 18.6 seconds — ABNORMAL HIGH (ref 11.4–15.2)

## 2021-10-19 MED ORDER — SODIUM CHLORIDE 0.9 % IV SOLN
INTRAVENOUS | Status: AC
  Administered 2021-10-19: 2 g via INTRAVENOUS
  Filled 2021-10-19: qty 20

## 2021-10-19 MED ORDER — SODIUM CHLORIDE 0.9 % IV SOLN: 2.0000 g | INTRAVENOUS | Status: AC

## 2021-10-19 MED ORDER — FENTANYL CITRATE (PF) 100 MCG/2ML IJ SOLN
INTRAMUSCULAR | Status: AC
  Filled 2021-10-19: qty 2

## 2021-10-19 MED ORDER — FENTANYL CITRATE (PF) 100 MCG/2ML IJ SOLN
INTRAMUSCULAR | Status: AC | PRN
Start: 2021-10-19 — End: 2021-10-19
  Administered 2021-10-19: 25 ug via INTRAVENOUS

## 2021-10-19 MED ORDER — SODIUM CHLORIDE 0.9 % IV SOLN: INTRAVENOUS | Status: DC

## 2021-10-19 MED ORDER — IOHEXOL 300 MG/ML  SOLN
100.0000 mL | Freq: Once | INTRAMUSCULAR | Status: AC | PRN
  Administered 2021-10-19: 40 mL via INTRA_ARTERIAL

## 2021-10-19 MED ORDER — LIDOCAINE HCL 1 % IJ SOLN
INTRAMUSCULAR | Status: AC
  Filled 2021-10-19: qty 20

## 2021-10-19 MED ORDER — MIDAZOLAM HCL 2 MG/2ML IJ SOLN
INTRAMUSCULAR | Status: AC | PRN
  Administered 2021-10-19: 1 mg via INTRAVENOUS

## 2021-10-19 MED ORDER — MIDAZOLAM HCL 2 MG/2ML IJ SOLN
INTRAMUSCULAR | Status: AC
  Filled 2021-10-19: qty 2

## 2021-10-19 NOTE — Procedures (Signed)
Interventional Radiology Procedure Note  Procedure: TIPS check  Findings: Please refer to procedural dictation for full description. Patent TIPS with portosystemic gradient of 8 mmHg (Right atrium = 10, Main portal vein = 18).  No evidence of new or recanalized gastroesophageal varices.    Complications: None  Estimated Blood Loss:  < 5 mL  Recommendations: Follow up with IR in 6 months.   Ruthann Cancer, MD Pager: [phone]

## 2021-10-19 NOTE — Progress Notes (Unsigned)
[facility] ° °PROGRESS NOTE ° °Patient Care Team: °Crawford, Elizabeth A, MD as PCP - General (Internal Medicine) °Nahser, Philip J, MD as PCP - Cardiology (Cardiology) °Clance, Keith M, MD as Consulting Physician (Pulmonary Disease) °Wilson, Eric, MD as Consulting Physician (General Surgery) °Ganem, Salem F, MD (Gastroenterology) °Grapey, David, MD (Inactive) (Urology) °Matthews, John D, MD (Ophthalmology) °Herndon, Dana C, RN as Oncology Nurse Navigator (Oncology) ° °CHIEF COMPLAINTS/PURPOSE OF CONSULTATION:  °Lung mass follow up ° °HISTORY OF PRESENTING ILLNESS:  ° °[name] 76 y.o. male is here because of lung mass. ° °Patient is here for follow-up with his son. °Patient had a chest x-ray when he presented with confusion weakness on September 08, 2021 and this showed cardiomegaly with probable mild to moderate pulmonary interstitial edema no focal consolidation or significant pleural effusion.  CT chest showed round masslike consolidation in the posterior right lower lobe new since June 2022 in light of the new mediastinal lymphadenopathy and patient smoking history findings are concerning for primary bronchogenic neoplasm.  Small to moderate right and trace left pleural effusions were also noted with mild to moderate interstitial pulmonary edema. °He had cytology from the right pleural effusion which showed malignant cells consistent with adenocarcinoma °PET/CT scheduled for February 6. °I have known this patient from the past, he used to follow-up with us because of his cirrhosis and pancytopenia.  He is not at a good functional status at baseline.  Even today he is sleepy, could not describe to me the situation.  I do not believe he understands the extent of the problem.  Son who was with him told me that he will try to talk to him once they get home. °Patient does not describe any worsening shortness of breath or cough.  He only describes easy bruising since the procedures. °He also has  diffuse bilateral lower extremity swelling.  Rest of the pertinent 10 point ROS reviewed and negative. ° °MEDICAL HISTORY:  °Past Medical History:  °Diagnosis Date  ° Arthritis   ° "joints tighten up sometimes" (03/27/2104)  ° Carcinoma of liver, hepatocellular (HCC) 06/30/2020  ° Chronic lower back pain   ° Coronary artery disease involving native coronary artery of native heart without angina pectoris   ° Severe left main disease at catheterization July 2015  CABG x3 with a LIMA to the LAD, SVG to the OM, SVG to the PDA on 03/31/14. EF 60% by cath.   ° Diastolic heart failure (HCC)   ° GERD without esophagitis 08/04/2010  ° Hepatic cirrhosis (HCC)   ° a. Dx 01/2014 - CT a/p   ° History of blood transfusion   ° "related to bleeding ulcers"  ° History of concussion   ° 1976--  NO RESIDUAL  ° History of GI bleed   ° a. UGIB 07/2012;  b. 01/2014 admission with GIB/FOB stool req 1U prbc's->EGD showed portal gastropathy, barrett's esoph, and chronic active h. pylori gastritis.  ° History of gout   ° 2007 &  2008  LEFT LEG-- NO ISSUE SINCE  ° Hyperlipidemia   ° Iron deficiency anemia   ° Kidney stones   ° OA (osteoarthritis of spine)   ° LOWER BACK--  INTERMITTANT LEFT LEG NUMBNESS  ° OSA (obstructive sleep apnea)   ° PULMOLOGIST-  DR CLANCE--  MODERATE OSA  STARTED CPAP 2012--  BUT CURRENTLY HAS NOT USED PAST 6 MONTHS  ° Phimosis   ° a. s/p circumcision 2015.  ° Type 2 diabetes mellitus (  HCC)   ° Unspecified essential hypertension   ° ° °SURGICAL HISTORY: °Past Surgical History:  °Procedure Laterality Date  ° ANKLE FRACTURE SURGERY Right 1989  ° "plate put in"  ° APPENDECTOMY  05-16-2004  ° open  ° BIOPSY  01/08/2021  ° Procedure: BIOPSY;  Surgeon: Schooler, Vincent, MD;  Location: [facility];  Service: Endoscopy;;  ° BIOPSY  05/02/2021  ° Procedure: BIOPSY;  Surgeon: Outlaw, William, MD;  Location: [facility];  Service: Endoscopy;;  ° CIRCUMCISION N/A 09/09/2013  ° Procedure: CIRCUMCISION ADULT;  Surgeon: David S Grapey,  MD;  Location: [facility];  Service: Urology;  Laterality: N/A;  ° COLECTOMY  05-16-2004  ° COLONOSCOPY N/A 05/02/2021  ° Procedure: COLONOSCOPY;  Surgeon: Outlaw, William, MD;  [facility]: [facility];  Service: Endoscopy;  Laterality: N/A;  ° COLONOSCOPY WITH PROPOFOL N/A 11/20/2020  ° Procedure: COLONOSCOPY WITH PROPOFOL;  Surgeon: Buccini, Robert, MD;  Location: [facility];  Service: Endoscopy;  Laterality: N/A;  ° CORONARY ANGIOPLASTY WITH STENT PLACEMENT  06/28/2012  DR COOPER  ° PCI W/  X1 DES to PROX. LAD/  LM  40% OSTIAL & 50-60% DISTAL /  50% PROX LCX/  30-40% PROX RCA & 50% MID RCA/   LVEF 65-70%  ° CORONARY ARTERY BYPASS GRAFT N/A 03/31/2014  ° Procedure: CORONARY ARTERY BYPASS GRAFTING (CABG) times 3 using left internal mammary artery and right saphenous vein.;  Surgeon: Steven C Hendrickson, MD;  Location: [facility];  Service: Open Heart Surgery;  Laterality: N/A;  ° DOBUTAMINE STRESS ECHO  06-08-2012  ° MODERATE HYPOKINESIS/ ISCHEMIA MID INFERIOR WALL  ° ESOPHAGOGASTRODUODENOSCOPY  08/15/2012  ° Procedure: ESOPHAGOGASTRODUODENOSCOPY (EGD);  Surgeon: Salem F Ganem, MD;  Location: [facility];  Service: Endoscopy;  Laterality: N/A;  ° ESOPHAGOGASTRODUODENOSCOPY N/A 02/17/2014  ° Procedure: ESOPHAGOGASTRODUODENOSCOPY (EGD);  Surgeon: Marc E Magod, MD;  Location: [facility];  Service: Endoscopy;  Laterality: N/A;  ° ESOPHAGOGASTRODUODENOSCOPY N/A 04/17/2020  ° Procedure: ESOPHAGOGASTRODUODENOSCOPY (EGD);  Surgeon: Karki, Arya, MD;  Location: [facility];  Service: Gastroenterology;  Laterality: N/A;  ° ESOPHAGOGASTRODUODENOSCOPY N/A 09/11/2020  ° Procedure: ESOPHAGOGASTRODUODENOSCOPY (EGD);  Surgeon: Magod, Marc, MD;  [facility];  Service: Endoscopy;  Laterality: N/A;  ° ESOPHAGOGASTRODUODENOSCOPY  10/09/2020  ° ESOPHAGOGASTRODUODENOSCOPY N/A 10/09/2020  ° Procedure: ESOPHAGOGASTRODUODENOSCOPY (EGD);  Surgeon: Brahmbhatt, Parag, MD;  [facility]: [facility];  Service: Gastroenterology;   Laterality: N/A;  ° ESOPHAGOGASTRODUODENOSCOPY N/A 01/08/2021  ° Procedure: ESOPHAGOGASTRODUODENOSCOPY (EGD);  Surgeon: Schooler, Vincent, MD;  [facility];  Service: Endoscopy;  Laterality: N/A;  ° ESOPHAGOGASTRODUODENOSCOPY N/A 05/02/2021  ° Procedure: ESOPHAGOGASTRODUODENOSCOPY (EGD);  Surgeon: Outlaw, William, MD;  [facility];  Service: Endoscopy;  Laterality: N/A;  ° ESOPHAGOGASTRODUODENOSCOPY (EGD) WITH PROPOFOL N/A 09/30/2017  ° Procedure: ESOPHAGOGASTRODUODENOSCOPY (EGD) WITH PROPOFOL;  Surgeon: Ganem, Salem F, MD;  [facility];  Service: Endoscopy;  Laterality: N/A;  ° ESOPHAGOGASTRODUODENOSCOPY (EGD) WITH PROPOFOL N/A 12/20/2017  ° Procedure: ESOPHAGOGASTRODUODENOSCOPY (EGD) WITH PROPOFOL;  Surgeon: Magod, Marc, MD;  [facility];  Service: Endoscopy;  Laterality: N/A;  ° ESOPHAGOGASTRODUODENOSCOPY (EGD) WITH PROPOFOL N/A 08/10/2020  ° Procedure: ESOPHAGOGASTRODUODENOSCOPY (EGD) WITH PROPOFOL;  Surgeon: Schooler, Vincent, MD;  [facility];  Service: Endoscopy;  Laterality: N/A;  ° ESOPHAGOGASTRODUODENOSCOPY (EGD) WITH PROPOFOL N/A 11/20/2020  ° Procedure: ESOPHAGOGASTRODUODENOSCOPY (EGD) WITH PROPOFOL;  Surgeon: Buccini, Robert, MD;  [facility];  Service: Endoscopy;  Laterality: N/A;  ° ESOPHAGOGASTRODUODENOSCOPY (EGD) WITH PROPOFOL N/A 02/28/2021  ° Procedure: ESOPHAGOGASTRODUODENOSCOPY (EGD) WITH PROPOFOL;  Surgeon: Magod, Marc, MD;    [facility];  Service: Endoscopy;  Laterality: N/A;   GIVENS CAPSULE STUDY N/A 11/20/2020   Procedure: GIVENS CAPSULE STUDY;  Surgeon: Ronald Lobo, MD;  [facility]: [facility];  Service: Endoscopy;  Laterality: N/A;   HEMOSTASIS CLIP PLACEMENT  05/02/2021   Procedure: HEMOSTASIS CLIP PLACEMENT;  Surgeon: Arta Silence, MD;  [facility];  Service: Endoscopy;;   HERNIA REPAIR     INTRAOPERATIVE TRANSESOPHAGEAL ECHOCARDIOGRAM N/A 03/31/2014   Procedure: INTRAOPERATIVE TRANSESOPHAGEAL  ECHOCARDIOGRAM;  Surgeon: Melrose Nakayama, MD;  Location: [place];  Service: Open Heart Surgery;  Laterality: N/A;   IR ANGIOGRAM SELECTIVE EACH ADDITIONAL VESSEL  06/16/2020   IR ANGIOGRAM SELECTIVE EACH ADDITIONAL VESSEL  06/16/2020   IR ANGIOGRAM SELECTIVE EACH ADDITIONAL VESSEL  06/16/2020   IR ANGIOGRAM SELECTIVE EACH ADDITIONAL VESSEL  06/16/2020   IR ANGIOGRAM SELECTIVE EACH ADDITIONAL VESSEL  06/16/2020   IR ANGIOGRAM SELECTIVE EACH ADDITIONAL VESSEL  06/16/2020   IR ANGIOGRAM SELECTIVE EACH ADDITIONAL VESSEL  06/16/2020   IR ANGIOGRAM SELECTIVE EACH ADDITIONAL VESSEL  06/16/2020   IR ANGIOGRAM SELECTIVE EACH ADDITIONAL VESSEL  06/16/2020   IR ANGIOGRAM SELECTIVE EACH ADDITIONAL VESSEL  06/30/2020   IR ANGIOGRAM SELECTIVE EACH ADDITIONAL VESSEL  06/30/2020   IR ANGIOGRAM VISCERAL SELECTIVE  06/16/2020   IR ANGIOGRAM VISCERAL SELECTIVE  06/30/2020   IR EMBO ARTERIAL NOT HEMORR HEMANG INC GUIDE ROADMAPPING  06/16/2020   IR EMBO TUMOR ORGAN ISCHEMIA INFARCT INC GUIDE ROADMAPPING  06/30/2020   IR EMBO VENOUS NOT HEMORR HEMANG  INC GUIDE ROADMAPPING  01/13/2021   IR IVUS EACH ADDITIONAL NON CORONARY VESSEL  01/13/2021   IR RADIOLOGIST EVAL & MGMT  05/06/2020   IR RADIOLOGIST EVAL & MGMT  05/20/2020   IR RADIOLOGIST EVAL & MGMT  05/25/2021   IR RADIOLOGIST EVAL & MGMT  09/22/2021   IR TIPS  01/13/2021   IR TIPS REVISION MOD SED  10/19/2021   IR US GUIDE VASC ACCESS RIGHT  06/16/2020   IR US GUIDE VASC ACCESS RIGHT  06/30/2020   IR US GUIDE VASC ACCESS RIGHT  01/13/2021   LAPAROSCOPIC UMBILICAL HERNIA REPAIR W/ MESH  06-06-2011   LEFT HEART CATHETERIZATION WITH CORONARY ANGIOGRAM N/A 03/28/2014   Procedure: LEFT HEART CATHETERIZATION WITH CORONARY ANGIOGRAM;  Surgeon: Sinclair Grooms, MD;  Location: [facility];  Service: Cardiovascular;  Laterality: N/A;   LIPOMA EXCISION Left 08/25/2017   Procedure: EXCISION LIPOMA LEFT POSTERIOR THIGH;  Surgeon: Clovis Riley, MD;  Location: [place];   Service: General;  Laterality: Left;   NEPHROLITHOTOMY  1990'S   OPEN APPENDECTOMY W/ PARTIAL CECECTOMY  05-16-2004   PERCUTANEOUS CORONARY STENT INTERVENTION (PCI-S) N/A 06/28/2012   Procedure: PERCUTANEOUS CORONARY STENT INTERVENTION (PCI-S);  Surgeon: Sherren Mocha, MD;  Location: [facility];  Service: Cardiovascular;  Laterality: N/A;   POLYPECTOMY  11/20/2020   Procedure: POLYPECTOMY;  Surgeon: Ronald Lobo, MD;  [facility];  Service: Endoscopy;;   POLYPECTOMY  05/02/2021   Procedure: POLYPECTOMY;  Surgeon: Arta Silence, MD;  [facility]: [facility];  Service: Endoscopy;;   RADIOLOGY WITH ANESTHESIA N/A 01/13/2021   Procedure: IR WITH ANESTHESIA - TIPS;  Surgeon: Suzette Battiest, MD;  Location: [place];  Service: Radiology;  Laterality: N/A;    SOCIAL HISTORY: Social History   Socioeconomic History   Marital status: Married    Spouse name: Not on file   Number of children: Not on file   Years of education: Not on file   Highest education  level: Not on file  Occupational History   Not on file  Tobacco Use   Smoking status: Former    Packs/day: 2.00    Years: 40.00    Pack years: 80.00    Types: Cigarettes    Quit date: 08/29/1996    Years since quitting: 25.1   Smokeless tobacco: Never  Vaping Use   Vaping Use: Never used  Substance and Sexual Activity   Alcohol use: Not Currently    Alcohol/week: 8.0 standard drinks    Types: 8 Cans of beer per week    Comment: 2 beers every other day   Drug use: No   Sexual activity: Yes  Other Topics Concern   Not on file  Social History Narrative   Lives in [place] with wife, son, and son's family.   Social Determinants of Health   Financial Resource Strain: Medium Risk   Difficulty of Paying Living Expenses: Somewhat hard  Food Insecurity: No Food Insecurity   Worried About Programme researcher, broadcasting/film/video in the Last Year: Never true   Ran Out of Food in the Last Year: Never true  Transportation Needs: No Transportation Needs    Lack of Transportation (Medical): No   Lack of Transportation (Non-Medical): No  Physical Activity: Not on file  Stress: Not on file  Social Connections: Not on file  Intimate Partner Violence: Not on file    FAMILY HISTORY: Family History  Problem Relation Age of Onset   Lung cancer Sister    Cancer Sister        lung   Cancer Mother    Cancer Father        died in his 46s.   Cancer Brother        lung   Coronary artery disease Other    Diabetes Other    Colon cancer Other    Cancer Sister        lung    ALLERGIES:  is allergic to fluzone quadrivalent [influenza vac split quad].  MEDICATIONS:  Current Outpatient Medications  Medication Sig Dispense Refill   Accu-Chek Softclix Lancets lancets USE AS DIRECTED TO TEST BLOOD SUGAR FOUR TIMES DAILY 300 each 3   albuterol (VENTOLIN HFA) 108 (90 Base) MCG/ACT inhaler Inhale 2 puffs into the lungs every 6 (six) hours as needed for wheezing or shortness of breath. 8 g 0   ANORO ELLIPTA 62.5-25 MCG/ACT AEPB Inhale 1 puff into the lungs daily. 1 each 1   blood glucose meter kit and supplies Dispense based on patient and insurance preference. Use up to four times daily as directed. (FOR ICD-10 E10.9, E11.9). 1 each 0   ferrous sulfate 325 (65 FE) MG tablet Take 1 tablet (325 mg total) by mouth daily with breakfast. (Patient not taking: Reported on 10/14/2021) 90 tablet 0   furosemide (LASIX) 40 MG tablet Take 1 tablet (40 mg total) by mouth daily. 90 tablet 1   lactulose (CHRONULAC) 10 GM/15ML solution Take 45 mLs (30 g total) by mouth 3 (three) times daily. 1892 mL 1   lidocaine (LIDODERM) 5 % Place 1 patch onto the skin daily. Remove & Discard patch within 12 hours or as directed by MD 30 patch 0   pantoprazole (PROTONIX) 40 MG tablet Take 1 tablet (40 mg total) by mouth 2 (two) times daily before a meal.     rifaximin (XIFAXAN) 550 MG TABS tablet Take 1 tablet (550 mg total) by mouth 2 (two) times daily. 60 tablet 0  spironolactone (ALDACTONE) 50 MG tablet Take 1 tablet (50 mg total) by mouth daily. 90 tablet 1   No current facility-administered medications for this visit.   Facility-Administered Medications Ordered in Other Visits  Medication Dose Route Frequency Provider Last Rate Last Admin   0.9 %  sodium chloride infusion   Intravenous Continuous Boisseau, Hayley, PA 10 mL/hr at 10/19/21 1053 New Bag at 10/19/21 1053   lidocaine (XYLOCAINE) 1 % (with pres) injection             PHYSICAL EXAMINATION: ECOG PERFORMANCE STATUS: 1 - Symptomatic but completely ambulatory  There were no vitals filed for this visit.  There were no vitals filed for this visit.  GENERAL:alert, falls asleep during conversation but easily arousable SKIN: skin color, texture, mild icterus EYES: normal, conjunctiva are pink and non-injected, sclera clear OROPHARYNX:no exudate, no erythema and lips, buccal mucosa, and tongue normal  LUNGS: Mostly clear even in lower lung fields.  No adventitious sounds HEART: regular rate & rhythm and no murmurs , severe pitting BLE edema. ABDOMEN: Multiple bruises, detailed abdominal exam not done today Musculoskeletal:no cyanosis of digits and no clubbing  PSYCH: speech is not fluent. NEURO: no focal motor/sensory deficits  LABORATORY DATA:  I have reviewed the data as listed Lab Results  Component Value Date   WBC 8.0 10/19/2021   HGB 9.3 (L) 10/19/2021   HCT 30.1 (L) 10/19/2021   MCV 82.9 10/19/2021   PLT 162 10/19/2021     Chemistry      Component Value Date/Time   NA 133 (L) 10/19/2021 0837   NA 139 08/25/2020 1420   K 3.5 10/19/2021 0837   CL 98 10/19/2021 0837   CO2 26 10/19/2021 0837   BUN 12 10/19/2021 0837   BUN 26 08/25/2020 1420   CREATININE 1.07 10/19/2021 0837   CREATININE 1.13 05/20/2021 1444   CREATININE 0.75 06/09/2015 1609      Component Value Date/Time   CALCIUM 8.2 (L) 10/19/2021 0837   ALKPHOS 293 (H) 10/19/2021 0837   AST 53 (H) 10/19/2021 0837    AST 43 (H) 05/20/2021 1444   ALT 27 10/19/2021 0837   ALT 29 05/20/2021 1444   BILITOT 1.9 (H) 10/19/2021 0837   BILITOT 0.9 05/20/2021 1444      RADIOGRAPHIC STUDIES: I have personally reviewed the radiological images as listed and agreed with the findings in the report. NM PET Image Initial (PI) Skull Base To Thigh  Result Date: 10/15/2021 CLINICAL DATA:  Initial treatment strategy for right lower lobe pulmonary mass like opacity on CT. History of cirrhosis and hepatocellular carcinoma. EXAM: NUCLEAR MEDICINE PET SKULL BASE TO THIGH TECHNIQUE: 8.3 mCi F-18 FDG was injected intravenously. Full-ring PET imaging was performed from the skull base to thigh after the radiotracer. CT data was obtained and used for attenuation correction and anatomic localization. Fasting blood glucose: 88 mg/dl COMPARISON:  Chest CT 09/10/2021.  Abdominopelvic CTA 09/16/2021. FINDINGS: Mediastinal blood pool activity: SUV max 2.0 NECK: There is an enlarged supraclavicular lymph node on the right (1.4 cm on image 48/4, SUV max 22.7). No other hypermetabolic cervical lymph nodes are identified.There are no lesions of the pharyngeal mucosal space. Incidental CT findings: Diffuse aortic and branch vessel atherosclerosis. CHEST: There is extensive intense multifocal hypermetabolic activity within multiple mediastinal and hilar lymph nodes as well as the right lung and right pleural space. 2.0 cm right paratracheal node on image 61/4 has an SUV max of 32.7. A right hilar mass measuring approximately 3.4  cm on image 81/4 has an SUV max of 29.0. A right lower lobe mass measuring approximately 4.9 x 4.3 cm on image 102/4 has an SUV max of 25.1. The right lung hypermetabolic activity is all peripheral and may be pleural based. There is a hypermetabolic left hilar lymph node, but no abnormal metabolic activity within the left lung or left pleural space. Incidental CT findings: Small right pleural effusion with associated pleural  based metastases dependently. Underlying calcified mediastinal and hilar lymph nodes compatible with prior granulomatous disease. Diffuse atherosclerosis of the aorta, great vessels and coronary arteries status post median sternotomy and CABG. Probable calcifications of the aortic valve. ABDOMEN/PELVIS: There is no hypermetabolic activity within the liver, adrenal glands, spleen or pancreas. The treated lesion in the dome of the right hepatic lobe demonstrates no hypermetabolic activity. However, there are several intensely hypermetabolic retroperitoneal abdominal lymph nodes, including a celiac node with an SUV max of 25.2 (largely obscured by artifact on the CT images), a 9 mm right retrocrural node on image 114/4 (SUV max 17.8) and a small left periaortic node on image 136/4 (SUV max 12.0). No hypermetabolic activity within the pelvis. Incidental CT findings: Streak artifact in the upper abdomen from previous embolization procedure. TIPS shunt remains in place. Multiple calcified splenic granulomas, chronic right renal scarring and nonobstructing right renal calculi and a right-sided lumbar hernia are noted. Diffuse aortic and branch vessel atherosclerosis. SKELETON: No definite hypermetabolic activity to suggest osseous metastatic disease. There is extensive right paraspinal activity in the chest related to right-sided pleural disease. Incidental CT findings: Bilateral gynecomastia. IMPRESSION: 1. Intensely hypermetabolic activity throughout the right pleural space, mediastinal and hilar lymph nodes bilaterally consistent with widespread metastatic disease. There are prominent right hilar and lower lobe components, either of which could reflect primary bronchogenic carcinoma. Recommend tissue sampling. 2. In addition, there are a few intensely hypermetabolic retroperitoneal lymph nodes in the abdomen. These are likely related to the primary thoracic process. 3. No abnormal activity within the treated  hepatocellular carcinoma in the lateral dome of the right hepatic lobe. 4. Additional incidental findings as above. Electronically Signed   By: Richardean Sale M.D.   On: 10/15/2021 15:23   IR TIPS REVISION MOD SED  Result Date: 10/19/2021 CLINICAL DATA:  76 year old male with history of cirrhosis (Child Pugh A, MELD-Na 11) with associated hemorrhagic gastroesophageal varices in addition to history of hepatocellular carcinoma status post TARE segmentectomy in November 2021. He is now status post TIPS creation on 01/13/21. He has continued to suffer from pancytopenia and anemia, with some evidence of GI tract etiology of blood loss. Given extensive gastroesophageal varices and minimal TIPS stent dilation after placement, there maybe future room for further decompression of the portal system or coil embolization of recanalized gastroesophageal varices. EXAM: 1. Ultrasound-guided vascular access of the right internal jugular vein. 2. Catheterization and angiography of the portal vein. 3. Portal and central venous manometry. MEDICATIONS: As antibiotic prophylaxis, Rocephin 2 gm IV was ordered pre-procedure and administered intravenously within one hour of incision. ANESTHESIA/SEDATION: Moderate (conscious) sedation was employed during this procedure. A total of Versed 1 mg and Fentanyl 25 mcg was administered intravenously. Moderate Sedation Time: 23 minutes. The patient's level of consciousness and vital signs were monitored continuously by radiology nursing throughout the procedure under my direct supervision. CONTRAST:  40 mL Omnipaque 300, intravenous FLUOROSCOPY: Radiation Exposure Index (as provided by the fluoroscopic device): 90 mGy Kerma COMPLICATIONS: None immediate. PROCEDURE: Informed written consent was obtained  from the patient after a thorough discussion of the procedural risks, benefits and alternatives. All questions were addressed. Maximal Sterile Barrier Technique was utilized including caps, mask,  sterile gowns, sterile gloves, sterile drape, hand hygiene and skin antiseptic. A timeout was performed prior to the initiation of the procedure. The right neck was prepped and draped in standard fashion. Preprocedure ultrasound demonstrated patency of the right internal jugular vein. Procedure was planned. Subdermal Local anesthesia was administered at the planned needle entry site with 1% lidocaine. Small skin nick was made. Under direct ultrasound visualization, a 21 gauge micropuncture needle was directed into the right internal jugular vein. A ultrasound image was captured and stored the permanent record. Micropuncture sheath was placed, through which a Rosen wire was directed to the inferior vena cava. Serial dilation was performed, ultimately allowing placement of a 10 French, 38.5 cm angled tip vascular sheath. The sheath was positioned in the right atrium and the inner introducer was removed. Right atrial manometry was performed (10 mm Hg). Next, a 5 [place] MPA catheter was used to select the indwelling TIPS endograft and ultimately the main portal vein. A Glidewire Advantage was inserted and the MPA catheter was exchanged for a marking catheter. Main portal venogram was performed which demonstrated brisk antegrade flow of the portal system with wide patency of the indwelling endograft. Portal manometry was performed (18 mm Hg). The Glidewire Advantage was reinserted to select the splenic vein. The marking pigtail catheter was advanced to the mid splenic vein and repeat portal venogram was performed. There is no evidence of patent gastric varix arising from the splenic vein. The embolized varices demonstrated no evidence of recanalization. The pigtail catheter was straightened with a wire and removed. The indwelling sheath was removed and manual compression was applied to the right neck for 5 minutes until hemostasis was achieved. The patient tolerated procedure well and was transferred to the recovery area  in good condition. IMPRESSION: 1. Widely patent TIPS with no evidence of significant portal hypertension (mean portosystemic gradient equals 8 mm Hg). 2. No evidence of patent gastroesophageal varices or recanalization of previously embolized varices arising from the splenic vein. PLAN: Follow-up in Interventional Radiology clinic in 6 months. No additional imaging is needed at that time. Ruthann Cancer, MD [facility] Electronically Signed   By: Ruthann Cancer M.D.   On: 10/19/2021 12:21   US ABDOMINAL PELVIC ART/VENT FLOW DOPPLER  Result Date: 09/22/2021 CLINICAL DATA:  76 year old male with history of portal hypertension status post TIPS creation on 01/13/2021 EXAM: DUPLEX ULTRASOUND OF LIVER AND TIPS SHUNT TECHNIQUE: Color and duplex Doppler ultrasound was performed to evaluate the hepatic in-flow and out-flow vessels. COMPARISON:  09/16/2021, 01/13/2021 FINDINGS: Portal Vein Velocities Main:  60 cm/sec, antegrade Right:  27 cm/sec, antegrade Left:  8 cm/sec, antegrade TIPS Stent Velocities Proximal:  86 cm/sec, antegrade Mid:  254 cm/sec, antegrade Distal:  153 cm/sec, antegrade IVC: Present and patent with normal respiratory phasicity. Hepatic Vein Velocities Right:  44 cm/sec Mid:  40 cm/sec Left:  43 cm/sec Splenic Vein: 42 cm per second, antegrade Superior Mesenteric Vein: Not visualized. Hepatic Artery: 145 cm/sec peak systolic velocity. Ascities: Absent. Varices: Absent. Other findings: Echogenic mass in the right lobe of the liver measuring up to 2.8 cm, compatible with prior treated hepatocellular carcinoma. IMPRESSION: Patent TIPS with increased central velocities suggestive of possible stenosis. Ruthann Cancer, MD [facility] Radiology Electronically Signed   By: Ruthann Cancer  M.D.   On: 09/22/2021 11:32   IR Radiologist Eval & Mgmt  Result Date: 09/22/2021 Please refer to notes tab for  details about interventional procedure. (Op Note)   ASSESSMENT & PLAN:   [name] is a 76 y.o. 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  #Pancytopenia 2/2 cirrhosis:  --Current plan is to provide supportive care with transfusion support.  --Labs today show Hgb of 7.4, white blood cell count of 3200 and platelet count of 160,000 --No indication for transfusion today. --RTC in 2 weeks for labs and FU.  We have once again discussed the reason for pancytopenia in this patient with liver disease and the need for periodic transfusions and supportive care. We are transfusing him for a hemoglobin of 7 g/dL or less unless active bleeding.  He is also not very symptomatic at least according to history from him today.  #Hx of hepatocellular carcinoma: --Treated with Y 90 back in November 2021 -- We have on multiple occasions discussed the importance of doing the MRI abdomen.  I have very clearly described this to the son as well.  I do not believe he still has his MRI done. We gave him the phone number to call during his last visit as well. No concern for [facility] on most recent PET scan.  #3 adenocarcinoma of lung, noted on pleural effusion.  I have discussed that since this is identified in the fluid, this indicates an advanced lung cancer, stage IV. Intensely hypermetabolic activity throughout the right pleural space, mediastinal and hilar lymph nodes bilaterally consistent with widespread metastatic disease. There are prominent right hilar and lower lobe components, either of which could reflect primary bronchogenic carcinoma.  I have recommended adding foundation 1 and PD-L1 testing on the pleural fluid sample but they do not have enough sample hence we sent for guardant 360. Unfortunately no targetable mutations on Guardant testing.   Since he cant receive upfront immunotherapy or targeted therapy, he is not a candidate for any chemotherapy. His PS and his  understanding of disease is poor. I think he is best served by hospice  Son is very much in agreement with these recommendations. Have spent 40 minutes in the care of this patient including history, review of records, counseling and coordination of care as mentioned above.   Benay Pike, MD Hematology and Oncology [facility] [place] P: [phone]

## 2021-10-19 NOTE — H&P (Signed)
Chief Complaint: Patient was seen in consultation today for TIPS evaluation and possible revision at the request of Dr Alessandra Bevels; Dr [name]   Supervising Physician: Ruthann Cancer  Patient Status: [facility] - Out-pt  History of Present Illness: [name] is a 76 y.o. male    Known to IR TIPS placed 01/13/21 Follows with Dr Serafina Royals Last office visit 09/22/21 Hx Hepatocellular cancer--- Y90 treatment in IR 06/2020 New Hx RLL mass- follows with Dr Elsworth Soho; + malignant effusion   Dr Serafina Royals evaluation 09/22/21: Assessment and Plan: 76 year old male with history of cirrhosis (Child Pugh A, MELD-Na 11) with associated hemorrhagic gastroesophageal varices in addition to history of hepatocellular carcinoma s/p TARE segmentectomy in November 2021.  He is now status post TIPS creation on 01/13/21. He has continued to suffer from pancytopenia and anemia, with some evidence of GI tract etiology of blood loss.  Given extensive gastroesophageal varices and minimal TIPS stent dilation after placement, there maybe future room for further decompression of the portal system.  Given additional comorbidities, especially facing possibly a new diagnosis of lung cancer, decreasing his risks of anemia should be prioritized.     Scheduled now for TIPS check with portosystemic manometry and possible balloon expansion of TIPS, possible variceal embolization   Past Medical History:  Diagnosis Date   Arthritis    "joints tighten up sometimes" (03/27/2104)   Carcinoma of liver, hepatocellular ([place]) 06/30/2020   Chronic lower back pain    Coronary artery disease involving native coronary artery of native heart without angina pectoris    Severe left main disease at catheterization July 2015  CABG x3 with a LIMA to the LAD, SVG to the OM, SVG to the PDA on 03/31/14. EF 60% by cath.    Diastolic heart failure (HCC)    GERD without esophagitis 08/04/2010   Hepatic cirrhosis ([place])    a. Dx 01/2014 - CT a/p    History of  blood transfusion    "related to bleeding ulcers"   History of concussion    1976--  NO RESIDUAL   History of GI bleed    a. UGIB 07/2012;  b. 01/2014 admission with GIB/FOB stool req 1U prbc's->EGD showed portal gastropathy, barrett's esoph, and chronic active h. pylori gastritis.   History of gout    2007 &  2008  LEFT LEG-- NO ISSUE SINCE   Hyperlipidemia    Iron deficiency anemia    Kidney stones    OA (osteoarthritis of spine)    LOWER BACK--  INTERMITTANT LEFT LEG NUMBNESS   OSA (obstructive sleep apnea)    PULMOLOGIST-  DR CLANCE--  MODERATE OSA  STARTED CPAP 2012--  BUT CURRENTLY HAS NOT USED PAST 6 MONTHS   Phimosis    a. s/p circumcision 2015.   Type 2 diabetes mellitus ([place])    Unspecified essential hypertension     Past Surgical History:  Procedure Laterality Date   ANKLE FRACTURE SURGERY Right 1989   "plate put in"   APPENDECTOMY  05-16-2004   open   BIOPSY  01/08/2021   Procedure: BIOPSY;  Surgeon: Wilford Corner, MD;  Location: [facility];  Service: Endoscopy;;   BIOPSY  05/02/2021   Procedure: BIOPSY;  Surgeon: Arta Silence, MD;  Location: [facility];  Service: Endoscopy;;   CIRCUMCISION N/A 09/09/2013   Procedure: CIRCUMCISION ADULT;  Surgeon: Bernestine Amass, MD;  Location: [facility];  Service: Urology;  Laterality: N/A;   COLECTOMY  05-16-2004   COLONOSCOPY N/A  05/02/2021   Procedure: COLONOSCOPY;  Surgeon: Arta Silence, MD;  Location: [facility];  Service: Endoscopy;  Laterality: N/A;   COLONOSCOPY WITH PROPOFOL N/A 11/20/2020   Procedure: COLONOSCOPY WITH PROPOFOL;  Surgeon: Ronald Lobo, MD;  Location: [facility];  Service: Endoscopy;  Laterality: N/A;   CORONARY ANGIOPLASTY WITH STENT PLACEMENT  06/28/2012  DR COOPER   PCI W/  X1 DES to [place]. LAD/  LM  40% OSTIAL & 50-60% DISTAL /  50% PROX LCX/  30-40% PROX RCA & 50% MID RCA/   LVEF 65-70%   CORONARY ARTERY BYPASS GRAFT N/A 03/31/2014   Procedure: CORONARY ARTERY BYPASS  GRAFTING (CABG) times 3 using left internal mammary artery and right saphenous vein.;  Surgeon: Melrose Nakayama, MD;  Location: [name];  Service: Open Heart Surgery;  Laterality: N/A;   DOBUTAMINE STRESS ECHO  06-08-2012   MODERATE HYPOKINESIS/ ISCHEMIA MID INFERIOR WALL   ESOPHAGOGASTRODUODENOSCOPY  08/15/2012   Procedure: ESOPHAGOGASTRODUODENOSCOPY (EGD);  Surgeon: Wonda Horner, MD;  Location: [facility] ENDOSCOPY;  Service: Endoscopy;  Laterality: N/A;   ESOPHAGOGASTRODUODENOSCOPY N/A 02/17/2014   Procedure: ESOPHAGOGASTRODUODENOSCOPY (EGD);  Surgeon: Jeryl Columbia, MD;  Location[facility]COPY;  Service: Endoscopy;  Laterality: N/A;   ESOPHAGOGASTRODUODENOSCOPY N/A 04/17/2020   Procedure: ESOPHAGOGASTRODUODENOSCOPY (EGD);  Surgeon: Ronnette Juniper, MD;  Location: [place];  Service: Gastroenterology;  Laterality: N/A;   ESOPHAGOGASTRODUODENOSCOPY N/A 09/11/2020   Procedure: ESOPHAGOGASTRODUODENOSCOPY (EGD);  Surgeon: Clarene Essex, MD;  Location: [facility];  Service: Endoscopy;  Laterality: N/A;   ESOPHAGOGASTRODUODENOSCOPY  10/09/2020   ESOPHAGOGASTRODUODENOSCOPY N/A 10/09/2020   Procedure: ESOPHAGOGASTRODUODENOSCOPY (EGD);  Surgeon: Otis Brace, MD;  Location: [facility] ENDOSCOPY;  Service: Gastroenterology;  Laterality: N/A;   ESOPHAGOGASTRODUODENOSCOPY N/A 01/08/2021   Procedure: ESOPHAGOGASTRODUODENOSCOPY (EGD);  Surgeon: Wilford Corner, MD;  Location: [facility];  Service: Endoscopy;  Laterality: N/A;   ESOPHAGOGASTRODUODENOSCOPY N/A 05/02/2021   Procedure: ESOPHAGOGASTRODUODENOSCOPY (EGD);  Surgeon: Arta Silence, MD;  Location: [facility];  Service: Endoscopy;  Laterality: N/A;   ESOPHAGOGASTRODUODENOSCOPY (EGD) WITH PROPOFOL N/A 09/30/2017   Procedure: ESOPHAGOGASTRODUODENOSCOPY (EGD) WITH PROPOFOL;  Surgeon: Wonda Horner, MD;  Location: [facility] ENDOSCOPY;  Service: Endoscopy;  Laterality: N/A;   ESOPHAGOGASTRODUODENOSCOPY (EGD) WITH PROPOFOL N/A 12/20/2017   Procedure:  ESOPHAGOGASTRODUODENOSCOPY (EGD) WITH PROPOFOL;  Surgeon: Clarene Essex, MD;  Location: [place];  Service: Endoscopy;  Laterality: N/A;   ESOPHAGOGASTRODUODENOSCOPY (EGD) WITH PROPOFOL N/A 08/10/2020   Procedure: ESOPHAGOGASTRODUODENOSCOPY (EGD) WITH PROPOFOL;  Surgeon: Wilford Corner, MD;  Location: [place];  Service: Endoscopy;  Laterality: N/A;   ESOPHAGOGASTRODUODENOSCOPY (EGD) WITH PROPOFOL N/A 11/20/2020   Procedure: ESOPHAGOGASTRODUODENOSCOPY (EGD) WITH PROPOFOL;  Surgeon: Ronald Lobo, MD;  [facility];  Service: Endoscopy;  Laterality: N/A;   ESOPHAGOGASTRODUODENOSCOPY (EGD) WITH PROPOFOL N/A 02/28/2021   Procedure: ESOPHAGOGASTRODUODENOSCOPY (EGD) WITH PROPOFOL;  Surgeon: Clarene Essex, MD;  [facility];  Service: Endoscopy;  Laterality: N/A;   GIVENS CAPSULE STUDY N/A 11/20/2020   Procedure: GIVENS CAPSULE STUDY;  Surgeon: Ronald Lobo, MD;  [facility];  Service: Endoscopy;  Laterality: N/A;   HEMOSTASIS CLIP PLACEMENT  05/02/2021   Procedure: HEMOSTASIS CLIP PLACEMENT;  Surgeon: Arta Silence, MD;  [facility];  Service: Endoscopy;;   HERNIA REPAIR     INTRAOPERATIVE TRANSESOPHAGEAL ECHOCARDIOGRAM N/A 03/31/2014   Procedure: INTRAOPERATIVE TRANSESOPHAGEAL ECHOCARDIOGRAM;  Surgeon: Melrose Nakayama, MD;  Location: [place];  Service: Open Heart Surgery;  Laterality: N/A;   IR ANGIOGRAM SELECTIVE EACH ADDITIONAL VESSEL  06/16/2020   IR ANGIOGRAM SELECTIVE EACH ADDITIONAL VESSEL  06/16/2020  IR ANGIOGRAM SELECTIVE EACH ADDITIONAL VESSEL  06/16/2020   IR ANGIOGRAM SELECTIVE EACH ADDITIONAL VESSEL  06/16/2020   IR ANGIOGRAM SELECTIVE EACH ADDITIONAL VESSEL  06/16/2020   IR ANGIOGRAM SELECTIVE EACH ADDITIONAL VESSEL  06/16/2020   IR ANGIOGRAM SELECTIVE EACH ADDITIONAL VESSEL  06/16/2020   IR ANGIOGRAM SELECTIVE EACH ADDITIONAL VESSEL  06/16/2020   IR ANGIOGRAM SELECTIVE EACH ADDITIONAL VESSEL  06/16/2020   IR ANGIOGRAM SELECTIVE  EACH ADDITIONAL VESSEL  06/30/2020   IR ANGIOGRAM SELECTIVE EACH ADDITIONAL VESSEL  06/30/2020   IR ANGIOGRAM VISCERAL SELECTIVE  06/16/2020   IR ANGIOGRAM VISCERAL SELECTIVE  06/30/2020   IR EMBO ARTERIAL NOT HEMORR HEMANG INC GUIDE ROADMAPPING  06/16/2020   IR EMBO TUMOR ORGAN ISCHEMIA INFARCT INC GUIDE ROADMAPPING  06/30/2020   IR EMBO VENOUS NOT HEMORR HEMANG  INC GUIDE ROADMAPPING  01/13/2021   IR IVUS EACH ADDITIONAL NON CORONARY VESSEL  01/13/2021   IR RADIOLOGIST EVAL & MGMT  05/06/2020   IR RADIOLOGIST EVAL & MGMT  05/20/2020   IR RADIOLOGIST EVAL & MGMT  05/25/2021   IR RADIOLOGIST EVAL & MGMT  09/22/2021   IR TIPS  01/13/2021   IR US GUIDE VASC ACCESS RIGHT  06/16/2020   IR US GUIDE VASC ACCESS RIGHT  06/30/2020   IR US GUIDE VASC ACCESS RIGHT  01/13/2021   LAPAROSCOPIC UMBILICAL HERNIA REPAIR W/ MESH  06-06-2011   LEFT HEART CATHETERIZATION WITH CORONARY ANGIOGRAM N/A 03/28/2014   Procedure: LEFT HEART CATHETERIZATION WITH CORONARY ANGIOGRAM;  Surgeon: Sinclair Grooms, MD;  Location: [facility] CATH LAB;  Service: Cardiovascular;  Laterality: N/A;   LIPOMA EXCISION Left 08/25/2017   Procedure: EXCISION LIPOMA LEFT POSTERIOR THIGH;  Surgeon: Clovis Riley, MD;  Location: [place];  Service: General;  Laterality: Left;   NEPHROLITHOTOMY  1990'S   OPEN APPENDECTOMY W/ PARTIAL CECECTOMY  05-16-2004   PERCUTANEOUS CORONARY STENT INTERVENTION (PCI-S) N/A 06/28/2012   Procedure: PERCUTANEOUS CORONARY STENT INTERVENTION (PCI-S);  Surgeon: Sherren Mocha, MD;  Location: [facility] CATH LAB;  Service: Cardiovascular;  Laterality: N/A;   POLYPECTOMY  11/20/2020   Procedure: POLYPECTOMY;  Surgeon: Ronald Lobo, MD;  [facility];  Service: Endoscopy;;   POLYPECTOMY  05/02/2021   Procedure: POLYPECTOMY;  Surgeon: Arta Silence, MD;  Location: [facility];  Service: Endoscopy;;   RADIOLOGY WITH ANESTHESIA N/A 01/13/2021   Procedure: IR WITH ANESTHESIA - TIPS;  Surgeon: Suzette Battiest, MD;  Location: [place];  Service: Radiology;  Laterality: N/A;    Allergies: Fluzone quadrivalent [influenza vac split quad]  Medications: Prior to Admission medications   Medication Sig Start Date End Date Taking? Authorizing Provider  albuterol (VENTOLIN HFA) 108 (90 Base) MCG/ACT inhaler Inhale 2 puffs into the lungs every 6 (six) hours as needed for wheezing or shortness of breath. 03/01/21  Yes Aline August, MD  ANORO ELLIPTA 62.5-25 MCG/ACT AEPB Inhale 1 puff into the lungs daily. 07/25/21  Yes Mercy Riding, MD  furosemide (LASIX) 40 MG tablet Take 1 tablet (40 mg total) by mouth daily. 07/25/21 10/23/21 Yes Mercy Riding, MD  lactulose (CHRONULAC) 10 GM/15ML solution Take 45 mLs (30 g total) by mouth 3 (three) times daily. 09/07/21  Yes [name], MD  pantoprazole (PROTONIX) 40 MG tablet Take 1 tablet (40 mg total) by mouth 2 (two) times daily before a meal. 06/18/21  Yes Ghimire, Henreitta Leber, MD  spironolactone (ALDACTONE) 50 MG tablet Take 1 tablet (50 mg total) by mouth daily. 07/25/21  Yes Gonfa,  Charlesetta Ivory, MD  Accu-Chek Softclix Lancets lancets USE AS DIRECTED TO TEST BLOOD SUGAR FOUR TIMES DAILY 03/13/20   [name], MD  blood glucose meter kit and supplies Dispense based on patient and insurance preference. Use up to four times daily as directed. (FOR ICD-10 E10.9, E11.9). 03/12/20   [name], MD  ferrous sulfate 325 (65 FE) MG tablet Take 1 tablet (325 mg total) by mouth daily with breakfast. Patient not taking: Reported on 10/14/2021 05/06/21 09/16/22  [name], MD  lidocaine (LIDODERM) 5 % Place 1 patch onto the skin daily. Remove & Discard patch within 12 hours or as directed by MD 10/12/21   [name], MD  rifaximin (XIFAXAN) 550 MG TABS tablet Take 1 tablet (550 mg total) by mouth 2 (two) times daily. 09/12/21   Aline August, MD     Family History  Problem Relation Age of Onset   Lung cancer Sister    Cancer Sister        lung   Cancer Mother     Cancer Father        died in his 23s.   Cancer Brother        lung   Coronary artery disease Other    Diabetes Other    Colon cancer Other    Cancer Sister        lung    Social History   Socioeconomic History   Marital status: Married    Spouse name: Not on file   Number of children: Not on file   Years of education: Not on file   Highest education level: Not on file  Occupational History   Not on file  Tobacco Use   Smoking status: Former    Packs/day: 2.00    Years: 40.00    Pack years: 80.00    Types: Cigarettes    Quit date: 08/29/1996    Years since quitting: 25.1   Smokeless tobacco: Never  Vaping Use   Vaping Use: Never used  Substance and Sexual Activity   Alcohol use: Not Currently    Alcohol/week: 8.0 standard drinks    Types: 8 Cans of beer per week    Comment: 2 beers every other day   Drug use: No   Sexual activity: Yes  Other Topics Concern   Not on file  Social History Narrative   Lives in [place] with wife, son, and son's family.   Social Determinants of Health   Financial Resource Strain: Medium Risk   Difficulty of Paying Living Expenses: Somewhat hard  Food Insecurity: No Food Insecurity   Worried About Charity fundraiser in the Last Year: Never true   Ran Out of Food in the Last Year: Never true  Transportation Needs: No Transportation Needs   Lack of Transportation (Medical): No   Lack of Transportation (Non-Medical): No  Physical Activity: Not on file  Stress: Not on file  Social Connections: Not on file    Review of Systems: A 12 point ROS discussed and pertinent positives are indicated in the HPI above.  All other systems are negative.  Review of Systems  Constitutional:  Positive for activity change and unexpected weight change. Negative for fever.  Respiratory:  Positive for cough. Negative for shortness of breath.   Gastrointestinal:  Positive for abdominal pain, constipation and nausea. Negative for abdominal distention.   Neurological:  Positive for weakness.  Psychiatric/Behavioral:  Positive for confusion and decreased concentration. Negative for behavioral problems.  Vital Signs: BP 138/80    Pulse 91    Temp 98.1 F (36.7 C) (Oral)    Resp 18    Ht '5\' 8"'  (1.727 m)    Wt 160 lb (72.6 kg)    SpO2 99%    BMI 24.33 kg/m   Physical Exam Vitals reviewed.  HENT:     Mouth/Throat:     Mouth: Mucous membranes are moist.  Cardiovascular:     Rate and Rhythm: Normal rate and regular rhythm.     Heart sounds: Normal heart sounds.  Pulmonary:     Breath sounds: Wheezing present.  Abdominal:     General: There is no distension.     Palpations: Abdomen is soft.     Tenderness: There is no abdominal tenderness.  Musculoskeletal:        General: Normal range of motion.  Skin:    General: Skin is warm.  Neurological:     Mental Status: He is alert.     Comments: Confused today He can relate name and dob Most other questions he cannot answer correctly    Imaging: NM PET Image Initial (PI) Skull Base To Thigh  Result Date: 10/15/2021 CLINICAL DATA:  Initial treatment strategy for right lower lobe pulmonary mass like opacity on CT. History of cirrhosis and hepatocellular carcinoma. EXAM: NUCLEAR MEDICINE PET SKULL BASE TO THIGH TECHNIQUE: 8.3 mCi F-18 FDG was injected intravenously. Full-ring PET imaging was performed from the skull base to thigh after the radiotracer. CT data was obtained and used for attenuation correction and anatomic localization. Fasting blood glucose: 88 mg/dl COMPARISON:  Chest CT 09/10/2021.  Abdominopelvic CTA 09/16/2021. FINDINGS: Mediastinal blood pool activity: SUV max 2.0 NECK: There is an enlarged supraclavicular lymph node on the right (1.4 cm on image 48/4, SUV max 22.7). No other hypermetabolic cervical lymph nodes are identified.There are no lesions of the pharyngeal mucosal space. Incidental CT findings: Diffuse aortic and branch vessel atherosclerosis. CHEST: There is  extensive intense multifocal hypermetabolic activity within multiple mediastinal and hilar lymph nodes as well as the right lung and right pleural space. 2.0 cm right paratracheal node on image 61/4 has an SUV max of 32.7. A right hilar mass measuring approximately 3.4 cm on image 81/4 has an SUV max of 29.0. A right lower lobe mass measuring approximately 4.9 x 4.3 cm on image 102/4 has an SUV max of 25.1. The right lung hypermetabolic activity is all peripheral and may be pleural based. There is a hypermetabolic left hilar lymph node, but no abnormal metabolic activity within the left lung or left pleural space. Incidental CT findings: Small right pleural effusion with associated pleural based metastases dependently. Underlying calcified mediastinal and hilar lymph nodes compatible with prior granulomatous disease. Diffuse atherosclerosis of the aorta, great vessels and coronary arteries status post median sternotomy and CABG. Probable calcifications of the aortic valve. ABDOMEN/PELVIS: There is no hypermetabolic activity within the liver, adrenal glands, spleen or pancreas. The treated lesion in the dome of the right hepatic lobe demonstrates no hypermetabolic activity. However, there are several intensely hypermetabolic retroperitoneal abdominal lymph nodes, including a celiac node with an SUV max of 25.2 (largely obscured by artifact on the CT images), a 9 mm right retrocrural node on image 114/4 (SUV max 17.8) and a small left periaortic node on image 136/4 (SUV max 12.0). No hypermetabolic activity within the pelvis. Incidental CT findings: Streak artifact in the upper abdomen from previous embolization procedure. TIPS shunt remains in place. Multiple calcified  splenic granulomas, chronic right renal scarring and nonobstructing right renal calculi and a right-sided lumbar hernia are noted. Diffuse aortic and branch vessel atherosclerosis. SKELETON: No definite hypermetabolic activity to suggest osseous  metastatic disease. There is extensive right paraspinal activity in the chest related to right-sided pleural disease. Incidental CT findings: Bilateral gynecomastia. IMPRESSION: 1. Intensely hypermetabolic activity throughout the right pleural space, mediastinal and hilar lymph nodes bilaterally consistent with widespread metastatic disease. There are prominent right hilar and lower lobe components, either of which could reflect primary bronchogenic carcinoma. Recommend tissue sampling. 2. In addition, there are a few intensely hypermetabolic retroperitoneal lymph nodes in the abdomen. These are likely related to the primary thoracic process. 3. No abnormal activity within the treated hepatocellular carcinoma in the lateral dome of the right hepatic lobe. 4. Additional incidental findings as above. Electronically Signed   By: Richardean Sale M.D.   On: 10/15/2021 15:23   US ABDOMINAL PELVIC ART/VENT FLOW DOPPLER  Result Date: 09/22/2021 CLINICAL DATA:  76 year old male with history of portal hypertension status post TIPS creation on 01/13/2021 EXAM: DUPLEX ULTRASOUND OF LIVER AND TIPS SHUNT TECHNIQUE: Color and duplex Doppler ultrasound was performed to evaluate the hepatic in-flow and out-flow vessels. COMPARISON:  09/16/2021, 01/13/2021 FINDINGS: Portal Vein Velocities Main:  60 cm/sec, antegrade Right:  27 cm/sec, antegrade Left:  8 cm/sec, antegrade TIPS Stent Velocities Proximal:  86 cm/sec, antegrade Mid:  254 cm/sec, antegrade Distal:  153 cm/sec, antegrade IVC: Present and patent with normal respiratory phasicity. Hepatic Vein Velocities Right:  44 cm/sec Mid:  40 cm/sec Left:  43 cm/sec Splenic Vein: 42 cm per second, antegrade Superior Mesenteric Vein: Not visualized. Hepatic Artery: 145 cm/sec peak systolic velocity. Ascities: Absent. Varices: Absent. Other findings: Echogenic mass in the right lobe of the liver measuring up to 2.8 cm, compatible with prior treated hepatocellular carcinoma.  IMPRESSION: Patent TIPS with increased central velocities suggestive of possible stenosis. Ruthann Cancer, MD [facility] Radiology Electronically Signed   By: Ruthann Cancer M.D.   On: 09/22/2021 11:32   IR Radiologist Eval & Mgmt  Result Date: 09/22/2021 Please refer to notes tab for details about interventional procedure. (Op Note)   Labs:  CBC: Recent Labs    09/27/21 1146 09/29/21 1402 10/04/21 0910 10/19/21 0837  WBC 3.6* 3.2* 3.2* 8.0  HGB 7.6 Repeated and verified X2.* 7.4* 6.8* 9.3*  HCT 23.8 Repeated and verified X2.* 23.7* 21.7* 30.1*  PLT 159.0 160 134* 162    COAGS: Recent Labs    01/22/21 1558 01/29/21 0445 07/23/21 1649 07/24/21 0216 09/09/21 0309 10/19/21 0837  INR 1.3*   < > 1.3* 1.3* 1.3* 1.6*  APTT 34  --   --   --   --   --    < > = values in this interval not displayed.    BMP: Recent Labs    09/11/21 0315 09/12/21 0822 09/16/21 0055 09/16/21 0105 09/27/21 1146 10/19/21 0837  NA 136 139 131* 134* 132* 133*  K 3.6 3.9 4.1 4.4 4.2 3.5  CL 106 110 101 101 98 98  CO2 20* 21* 23  --  28 26  GLUCOSE 97 91 112* 110* 164* 104*  BUN '15 13 15 13 17 12  ' CALCIUM 8.2* 7.9* 8.4*  --  8.7 8.2*  CREATININE 1.18 1.07 0.97 1.00 1.07 1.07  GFRNONAA >60 >60 >60  --   --  >60    LIVER FUNCTION TESTS: Recent Labs  09/12/21 0822 09/16/21 0055 09/27/21 1146 10/19/21 0837  BILITOT 1.1 1.4* 1.0 1.9*  AST 29 43* 44* 53*  ALT '20 26 28 27  ' ALKPHOS 120 156* 267* 293*  PROT 5.1* 6.2* 6.3 6.3*  ALBUMIN 2.2* 2.9* 2.7* 2.1*    TUMOR MARKERS: No results for input(s): AFPTM, CEA, CA199, CHROMGRNA in the last 8760 hours.  Assessment and Plan:  Scheduled today for TIPS evaluation and possible revision/intervention Risks and benefits of TIPS evaluation and possible revision and/or additional variceal embolization were discussed with the patient and/or the patient's family including, but not limited to,  infection, bleeding, damage to adjacent structures, worsening hepatic and/or cardiac function, worsening and/or the development of altered mental status/encephalopathy, non-target embolization and death.   This interventional procedure involves the use of X-rays and because of the nature of the planned procedure, it is possible that we will have prolonged use of X-ray fluoroscopy.  Potential radiation risks to you include (but are not limited to) the following: - A slightly elevated risk for cancer  several years later in life. This risk is typically less than 0.5% percent. This risk is low in comparison to the normal incidence of human cancer, which is 33% for women and 50% for men according to the [employer]. - Radiation induced injury can include skin redness, resembling a rash, tissue breakdown / ulcers and hair loss (which can be temporary or permanent).   The likelihood of either of these occurring depends on the difficulty of the procedure and whether you are sensitive to radiation due to previous procedures, disease, or genetic conditions.   IF your procedure requires a prolonged use of radiation, you will be notified and given written instructions for further action.  It is your responsibility to monitor the irradiated area for the 2 weeks following the procedure and to notify your physician if you are concerned that you have suffered a radiation induced injury.    All of the patient's questions were answered, patient is agreeable to proceed.  Consent signed and in chart.   Thank you for this interesting consult.  I greatly enjoyed meeting [name] and look forward to participating in their care.  A copy of this report was sent to the requesting provider on this date.  Electronically Signed: Lavonia Drafts, PA-C 10/19/2021, 9:39 AM   I spent a total of    25 Minutes in face to face in clinical consultation, greater than 50% of which was counseling/coordinating  care for TIPS eval/revision/intervention

## 2021-10-20 ENCOUNTER — Other Ambulatory Visit (HOSPITAL_COMMUNITY): Payer: Self-pay | Admitting: Student

## 2021-10-20 ENCOUNTER — Inpatient Hospital Stay: Payer: Medicare HMO | Admitting: Hematology and Oncology

## 2021-10-21 DIAGNOSIS — G928 Other toxic encephalopathy: Secondary | ICD-10-CM | POA: Diagnosis not present

## 2021-10-21 DIAGNOSIS — E119 Type 2 diabetes mellitus without complications: Secondary | ICD-10-CM | POA: Diagnosis not present

## 2021-10-21 DIAGNOSIS — K279 Peptic ulcer, site unspecified, unspecified as acute or chronic, without hemorrhage or perforation: Secondary | ICD-10-CM | POA: Diagnosis not present

## 2021-10-21 DIAGNOSIS — K219 Gastro-esophageal reflux disease without esophagitis: Secondary | ICD-10-CM | POA: Diagnosis not present

## 2021-10-21 DIAGNOSIS — I110 Hypertensive heart disease with heart failure: Secondary | ICD-10-CM | POA: Diagnosis not present

## 2021-10-21 DIAGNOSIS — I5032 Chronic diastolic (congestive) heart failure: Secondary | ICD-10-CM | POA: Diagnosis not present

## 2021-10-21 DIAGNOSIS — E785 Hyperlipidemia, unspecified: Secondary | ICD-10-CM | POA: Diagnosis not present

## 2021-10-21 DIAGNOSIS — Z7951 Long term (current) use of inhaled steroids: Secondary | ICD-10-CM | POA: Diagnosis not present

## 2021-10-21 DIAGNOSIS — T40711D Poisoning by cannabis, accidental (unintentional), subsequent encounter: Secondary | ICD-10-CM | POA: Diagnosis not present

## 2021-10-21 DIAGNOSIS — I2510 Atherosclerotic heart disease of native coronary artery without angina pectoris: Secondary | ICD-10-CM | POA: Diagnosis not present

## 2021-10-21 DIAGNOSIS — K745 Biliary cirrhosis, unspecified: Secondary | ICD-10-CM | POA: Diagnosis not present

## 2021-10-21 DIAGNOSIS — I864 Gastric varices: Secondary | ICD-10-CM | POA: Diagnosis not present

## 2021-10-21 DIAGNOSIS — I700 Atherosclerosis of aorta: Secondary | ICD-10-CM | POA: Diagnosis not present

## 2021-10-21 DIAGNOSIS — M1990 Unspecified osteoarthritis, unspecified site: Secondary | ICD-10-CM | POA: Diagnosis not present

## 2021-10-21 DIAGNOSIS — Z8505 Personal history of malignant neoplasm of liver: Secondary | ICD-10-CM | POA: Diagnosis not present

## 2021-10-21 DIAGNOSIS — K766 Portal hypertension: Secondary | ICD-10-CM | POA: Diagnosis not present

## 2021-10-21 DIAGNOSIS — R188 Other ascites: Secondary | ICD-10-CM | POA: Diagnosis not present

## 2021-10-21 DIAGNOSIS — Z951 Presence of aortocoronary bypass graft: Secondary | ICD-10-CM | POA: Diagnosis not present

## 2021-10-21 DIAGNOSIS — G4733 Obstructive sleep apnea (adult) (pediatric): Secondary | ICD-10-CM | POA: Diagnosis not present

## 2021-10-21 DIAGNOSIS — N471 Phimosis: Secondary | ICD-10-CM | POA: Diagnosis not present

## 2021-10-21 DIAGNOSIS — R69 Illness, unspecified: Secondary | ICD-10-CM | POA: Diagnosis not present

## 2021-10-21 DIAGNOSIS — D509 Iron deficiency anemia, unspecified: Secondary | ICD-10-CM | POA: Diagnosis not present

## 2021-10-21 DIAGNOSIS — D61811 Other drug-induced pancytopenia: Secondary | ICD-10-CM | POA: Diagnosis not present

## 2021-10-21 DIAGNOSIS — M545 Low back pain, unspecified: Secondary | ICD-10-CM | POA: Diagnosis not present

## 2021-10-21 DIAGNOSIS — M1000 Idiopathic gout, unspecified site: Secondary | ICD-10-CM | POA: Diagnosis not present

## 2021-10-22 ENCOUNTER — Other Ambulatory Visit: Payer: Self-pay | Admitting: Internal Medicine

## 2021-10-25 ENCOUNTER — Telehealth: Payer: Self-pay

## 2021-10-25 DIAGNOSIS — I5032 Chronic diastolic (congestive) heart failure: Secondary | ICD-10-CM | POA: Diagnosis not present

## 2021-10-25 DIAGNOSIS — Z951 Presence of aortocoronary bypass graft: Secondary | ICD-10-CM | POA: Diagnosis not present

## 2021-10-25 DIAGNOSIS — I110 Hypertensive heart disease with heart failure: Secondary | ICD-10-CM | POA: Diagnosis not present

## 2021-10-25 DIAGNOSIS — D509 Iron deficiency anemia, unspecified: Secondary | ICD-10-CM | POA: Diagnosis not present

## 2021-10-25 DIAGNOSIS — M1000 Idiopathic gout, unspecified site: Secondary | ICD-10-CM | POA: Diagnosis not present

## 2021-10-25 DIAGNOSIS — D61811 Other drug-induced pancytopenia: Secondary | ICD-10-CM | POA: Diagnosis not present

## 2021-10-25 DIAGNOSIS — K766 Portal hypertension: Secondary | ICD-10-CM | POA: Diagnosis not present

## 2021-10-25 DIAGNOSIS — I700 Atherosclerosis of aorta: Secondary | ICD-10-CM | POA: Diagnosis not present

## 2021-10-25 DIAGNOSIS — R69 Illness, unspecified: Secondary | ICD-10-CM | POA: Diagnosis not present

## 2021-10-25 DIAGNOSIS — Z7951 Long term (current) use of inhaled steroids: Secondary | ICD-10-CM | POA: Diagnosis not present

## 2021-10-25 DIAGNOSIS — G4733 Obstructive sleep apnea (adult) (pediatric): Secondary | ICD-10-CM | POA: Diagnosis not present

## 2021-10-25 DIAGNOSIS — K279 Peptic ulcer, site unspecified, unspecified as acute or chronic, without hemorrhage or perforation: Secondary | ICD-10-CM | POA: Diagnosis not present

## 2021-10-25 DIAGNOSIS — E119 Type 2 diabetes mellitus without complications: Secondary | ICD-10-CM | POA: Diagnosis not present

## 2021-10-25 DIAGNOSIS — M1990 Unspecified osteoarthritis, unspecified site: Secondary | ICD-10-CM | POA: Diagnosis not present

## 2021-10-25 DIAGNOSIS — K219 Gastro-esophageal reflux disease without esophagitis: Secondary | ICD-10-CM | POA: Diagnosis not present

## 2021-10-25 DIAGNOSIS — I2510 Atherosclerotic heart disease of native coronary artery without angina pectoris: Secondary | ICD-10-CM | POA: Diagnosis not present

## 2021-10-25 DIAGNOSIS — K745 Biliary cirrhosis, unspecified: Secondary | ICD-10-CM | POA: Diagnosis not present

## 2021-10-25 DIAGNOSIS — G928 Other toxic encephalopathy: Secondary | ICD-10-CM | POA: Diagnosis not present

## 2021-10-25 DIAGNOSIS — R188 Other ascites: Secondary | ICD-10-CM | POA: Diagnosis not present

## 2021-10-25 DIAGNOSIS — M545 Low back pain, unspecified: Secondary | ICD-10-CM | POA: Diagnosis not present

## 2021-10-25 DIAGNOSIS — I864 Gastric varices: Secondary | ICD-10-CM | POA: Diagnosis not present

## 2021-10-25 DIAGNOSIS — N471 Phimosis: Secondary | ICD-10-CM | POA: Diagnosis not present

## 2021-10-25 DIAGNOSIS — T40711D Poisoning by cannabis, accidental (unintentional), subsequent encounter: Secondary | ICD-10-CM | POA: Diagnosis not present

## 2021-10-25 DIAGNOSIS — E785 Hyperlipidemia, unspecified: Secondary | ICD-10-CM | POA: Diagnosis not present

## 2021-10-25 DIAGNOSIS — Z8505 Personal history of malignant neoplasm of liver: Secondary | ICD-10-CM | POA: Diagnosis not present

## 2021-10-25 LAB — GUARDANT 360

## 2021-10-25 NOTE — Telephone Encounter (Signed)
Spoke with patient's son [name] and rescheduled Palliative Care consult to 11/01/21 @ 1PM.

## 2021-10-26 NOTE — Assessment & Plan Note (Signed)
Currently pleural effusion and pain persistent likely from thoracentesis as well as the underlying pleural effusion. He is still awaiting PET scan to check for staging and is likely not a candidate for chemotherapy or surgery for this likely new primary malignancy versus metastatic from prior liver cancer.

## 2021-10-27 ENCOUNTER — Inpatient Hospital Stay: Payer: Medicare HMO | Attending: Hematology and Oncology | Admitting: Hematology and Oncology

## 2021-10-27 ENCOUNTER — Encounter: Payer: Self-pay | Admitting: Hematology and Oncology

## 2021-10-27 ENCOUNTER — Other Ambulatory Visit: Payer: Self-pay

## 2021-10-27 DIAGNOSIS — K7030 Alcoholic cirrhosis of liver without ascites: Secondary | ICD-10-CM

## 2021-10-27 DIAGNOSIS — C3491 Malignant neoplasm of unspecified part of right bronchus or lung: Secondary | ICD-10-CM

## 2021-10-27 DIAGNOSIS — D61818 Other pancytopenia: Secondary | ICD-10-CM

## 2021-10-27 NOTE — Progress Notes (Signed)
[place] [place]  PROGRESS NOTE  Patient Care Team: [name], MD as PCP - General (Internal Medicine) Nahser, Wonda Cheng, MD as PCP - Cardiology (Cardiology) Clance, Armando Reichert, MD as Consulting Physician (Pulmonary Disease) Greer Pickerel, MD as Consulting Physician (General Surgery) Wonda Horner, MD (Gastroenterology) Rana Snare, MD (Inactive) (Urology) Hayden Pedro, MD (Ophthalmology) Valrie Hart, RN as Oncology Nurse Navigator (Oncology)  CHIEF COMPLAINTS/PURPOSE OF CONSULTATION:  Lung mass follow up  HISTORY OF PRESENTING ILLNESS:   [name] 76 y.o. male is here because of metastatic adenocarcinoma lung  HPI  Patient had a chest x-ray when he presented with confusion weakness on September 08, 2021 and this showed cardiomegaly with probable mild to moderate pulmonary interstitial edema no focal consolidation or significant pleural effusion.  CT chest showed round masslike consolidation in the posterior right lower lobe new since June 2022 in light of the new mediastinal lymphadenopathy and patient smoking history findings are concerning for primary bronchogenic neoplasm.  Small to moderate right and trace left pleural effusions were also noted with mild to moderate interstitial pulmonary edema. He had cytology from the right pleural effusion which showed malignant cells consistent with adenocarcinoma PET/CT showed Intensely hypermetabolic activity throughout the right pleural space, mediastinal and hilar lymph nodes bilaterally consistent with widespread metastatic disease. There are prominent right hilar and lower lobe components, either of which could reflect primary bronchogenic carcinoma. Recommend tissue sampling. 2. In addition, there are a few intensely hypermetabolic retroperitoneal lymph nodes in the abdomen. These are likely related to the primary thoracic process. Foundation and PDL1 testing couldn't be done on the pleural sample, this  was discarded after 72 hrs of collection and couldn't be done on the liquid Guardant was ordered, no targetable mutation, MSI S. PDL1 cannot be done on the blood is what we were told He is supposed to be talking to me for telephone visit today    MEDICAL HISTORY:  Past Medical History:  Diagnosis Date   Arthritis    "joints tighten up sometimes" (03/27/2104)   Carcinoma of liver, hepatocellular ([place]) 06/30/2020   Chronic lower back pain    Coronary artery disease involving native coronary artery of native heart without angina pectoris    Severe left main disease at catheterization July 2015  CABG x3 with a LIMA to the LAD, SVG to the OM, SVG to the PDA on 03/31/14. EF 60% by cath.    Diastolic heart failure (HCC)    GERD without esophagitis 08/04/2010   Hepatic cirrhosis ([place])    a. Dx 01/2014 - CT a/p    History of blood transfusion    "related to bleeding ulcers"   History of concussion    1976--  NO RESIDUAL   History of GI bleed    a. UGIB 07/2012;  b. 01/2014 admission with GIB/FOB stool req 1U prbc's->EGD showed portal gastropathy, barrett's esoph, and chronic active h. pylori gastritis.   History of gout    2007 &  2008  LEFT LEG-- NO ISSUE SINCE   Hyperlipidemia    Iron deficiency anemia    Kidney stones    OA (osteoarthritis of spine)    LOWER BACK--  INTERMITTANT LEFT LEG NUMBNESS   OSA (obstructive sleep apnea)    PULMOLOGIST-  DR CLANCE--  MODERATE OSA  STARTED CPAP 2012--  BUT CURRENTLY HAS NOT USED PAST 6 MONTHS   Phimosis    a. s/p circumcision 2015.   Type 2 diabetes mellitus (  [place])    Unspecified essential hypertension     SURGICAL HISTORY: Past Surgical History:  Procedure Laterality Date   ANKLE FRACTURE SURGERY Right 1989   "plate put in"   APPENDECTOMY  05-16-2004   open   BIOPSY  01/08/2021   Procedure: BIOPSY;  Surgeon: Wilford Corner, MD;  Location: [facility];  Service: Endoscopy;;   BIOPSY  05/02/2021   Procedure: BIOPSY;   Surgeon: Arta Silence, MD;  Location: [facility];  Service: Endoscopy;;   CIRCUMCISION N/A 09/09/2013   Procedure: CIRCUMCISION ADULT;  Surgeon: Bernestine Amass, MD;  [facility];  Service: Urology;  Laterality: N/A;   COLECTOMY  05-16-2004   COLONOSCOPY N/A 05/02/2021   Procedure: COLONOSCOPY;  Surgeon: Arta Silence, MD;  [facility]: [facility];  Service: Endoscopy;  Laterality: N/A;   COLONOSCOPY WITH PROPOFOL N/A 11/20/2020   Procedure: COLONOSCOPY WITH PROPOFOL;  Surgeon: Ronald Lobo, MD;  Location: [facility];  Service: Endoscopy;  Laterality: N/A;   CORONARY ANGIOPLASTY WITH STENT PLACEMENT  06/28/2012  DR COOPER   PCI W/  X1 DES to [place]. LAD/  LM  40% OSTIAL & 50-60% DISTAL /  50% PROX LCX/  30-40% PROX RCA & 50% MID RCA/   LVEF 65-70%   CORONARY ARTERY BYPASS GRAFT N/A 03/31/2014   Procedure: CORONARY ARTERY BYPASS GRAFTING (CABG) times 3 using left internal mammary artery and right saphenous vein.;  Surgeon: Melrose Nakayama, MD;  Location: [place];  Service: Open Heart Surgery;  Laterality: N/A;   DOBUTAMINE STRESS ECHO  06-08-2012   MODERATE HYPOKINESIS/ ISCHEMIA MID INFERIOR WALL   ESOPHAGOGASTRODUODENOSCOPY  08/15/2012   Procedure: ESOPHAGOGASTRODUODENOSCOPY (EGD);  Surgeon: Wonda Horner, MD;  Location: [facility] ENDOSCOPY;  Service: Endoscopy;  Laterality: N/A;   ESOPHAGOGASTRODUODENOSCOPY N/A 02/17/2014   Procedure: ESOPHAGOGASTRODUODENOSCOPY (EGD);  Surgeon: Jeryl Columbia, MD;  Location: [facility] ENDOSCOPY;  Service: Endoscopy;  Laterality: N/A;   ESOPHAGOGASTRODUODENOSCOPY N/A 04/17/2020   Procedure: ESOPHAGOGASTRODUODENOSCOPY (EGD);  Surgeon: Ronnette Juniper, MD;  Location: [place];  Service: Gastroenterology;  Laterality: N/A;   ESOPHAGOGASTRODUODENOSCOPY N/A 09/11/2020   Procedure: ESOPHAGOGASTRODUODENOSCOPY (EGD);  Surgeon: Clarene Essex, MD;  Location: [facility];  Service: Endoscopy;  Laterality: N/A;   ESOPHAGOGASTRODUODENOSCOPY  10/09/2020    ESOPHAGOGASTRODUODENOSCOPY N/A 10/09/2020   Procedure: ESOPHAGOGASTRODUODENOSCOPY (EGD);  Surgeon: Otis Brace, MD;  Location: [facility] ENDOSCOPY;  Service: Gastroenterology;  Laterality: N/A;   ESOPHAGOGASTRODUODENOSCOPY N/A 01/08/2021   Procedure: ESOPHAGOGASTRODUODENOSCOPY (EGD);  Surgeon: Wilford Corner, MD;  Location: [facility];  Service: Endoscopy;  Laterality: N/A;   ESOPHAGOGASTRODUODENOSCOPY N/A 05/02/2021   Procedure: ESOPHAGOGASTRODUODENOSCOPY (EGD);  Surgeon: Arta Silence, MD;  Location: [facility];  Service: Endoscopy;  Laterality: N/A;   ESOPHAGOGASTRODUODENOSCOPY (EGD) WITH PROPOFOL N/A 09/30/2017   Procedure: ESOPHAGOGASTRODUODENOSCOPY (EGD) WITH PROPOFOL;  Surgeon: Wonda Horner, MD;  Location: [facility] ENDOSCOPY;  Service: Endoscopy;  Laterality: N/A;   ESOPHAGOGASTRODUODENOSCOPY (EGD) WITH PROPOFOL N/A 12/20/2017   Procedure: ESOPHAGOGASTRODUODENOSCOPY (EGD) WITH PROPOFOL;  Surgeon: Clarene Essex, MD;  Location: [place];  Service: Endoscopy;  Laterality: N/A;   ESOPHAGOGASTRODUODENOSCOPY (EGD) WITH PROPOFOL N/A 08/10/2020   Procedure: ESOPHAGOGASTRODUODENOSCOPY (EGD) WITH PROPOFOL;  Surgeon: Wilford Corner, MD;  Location: [place];  Service: Endoscopy;  Laterality: N/A;   ESOPHAGOGASTRODUODENOSCOPY (EGD) WITH PROPOFOL N/A 11/20/2020   Procedure: ESOPHAGOGASTRODUODENOSCOPY (EGD) WITH PROPOFOL;  Surgeon: Ronald Lobo, MD;  [facility];  Service: Endoscopy;  Laterality: N/A;   ESOPHAGOGASTRODUODENOSCOPY (EGD) WITH PROPOFOL N/A 02/28/2021   Procedure: ESOPHAGOGASTRODUODENOSCOPY (EGD) WITH PROPOFOL;  Surgeon: Clarene Essex, MD;  [facility];  Service: Endoscopy;  Laterality: N/A;   GIVENS CAPSULE STUDY N/A 11/20/2020   Procedure: GIVENS CAPSULE STUDY;  Surgeon: Ronald Lobo, MD;  [facility];  Service: Endoscopy;  Laterality: N/A;   HEMOSTASIS CLIP PLACEMENT  05/02/2021   Procedure: HEMOSTASIS CLIP PLACEMENT;  Surgeon: Arta Silence, MD;  [facility];  Service: Endoscopy;;   HERNIA REPAIR     INTRAOPERATIVE TRANSESOPHAGEAL ECHOCARDIOGRAM N/A 03/31/2014   Procedure: INTRAOPERATIVE TRANSESOPHAGEAL ECHOCARDIOGRAM;  Surgeon: Melrose Nakayama, MD;  Location: [place];  Service: Open Heart Surgery;  Laterality: N/A;   IR ANGIOGRAM SELECTIVE EACH ADDITIONAL VESSEL  06/16/2020   IR ANGIOGRAM SELECTIVE EACH ADDITIONAL VESSEL  06/16/2020   IR ANGIOGRAM SELECTIVE EACH ADDITIONAL VESSEL  06/16/2020   IR ANGIOGRAM SELECTIVE EACH ADDITIONAL VESSEL  06/16/2020   IR ANGIOGRAM SELECTIVE EACH ADDITIONAL VESSEL  06/16/2020   IR ANGIOGRAM SELECTIVE EACH ADDITIONAL VESSEL  06/16/2020   IR ANGIOGRAM SELECTIVE EACH ADDITIONAL VESSEL  06/16/2020   IR ANGIOGRAM SELECTIVE EACH ADDITIONAL VESSEL  06/16/2020   IR ANGIOGRAM SELECTIVE EACH ADDITIONAL VESSEL  06/16/2020   IR ANGIOGRAM SELECTIVE EACH ADDITIONAL VESSEL  06/30/2020   IR ANGIOGRAM SELECTIVE EACH ADDITIONAL VESSEL  06/30/2020   IR ANGIOGRAM VISCERAL SELECTIVE  06/16/2020   IR ANGIOGRAM VISCERAL SELECTIVE  06/30/2020   IR EMBO ARTERIAL NOT HEMORR HEMANG INC GUIDE ROADMAPPING  06/16/2020   IR EMBO TUMOR ORGAN ISCHEMIA INFARCT INC GUIDE ROADMAPPING  06/30/2020   IR EMBO VENOUS NOT HEMORR HEMANG  INC GUIDE ROADMAPPING  01/13/2021   IR IVUS EACH ADDITIONAL NON CORONARY VESSEL  01/13/2021   IR RADIOLOGIST EVAL & MGMT  05/06/2020   IR RADIOLOGIST EVAL & MGMT  05/20/2020   IR RADIOLOGIST EVAL & MGMT  05/25/2021   IR RADIOLOGIST EVAL & MGMT  09/22/2021   IR TIPS  01/13/2021   IR TIPS REVISION MOD SED  10/19/2021   IR US GUIDE VASC ACCESS RIGHT  06/16/2020   IR US GUIDE VASC ACCESS RIGHT  06/30/2020   IR US GUIDE VASC ACCESS RIGHT  01/13/2021   LAPAROSCOPIC UMBILICAL HERNIA REPAIR W/ MESH  06-06-2011   LEFT HEART CATHETERIZATION WITH CORONARY ANGIOGRAM N/A 03/28/2014   Procedure: LEFT HEART CATHETERIZATION WITH CORONARY ANGIOGRAM;  Surgeon: Sinclair Grooms,  MD;  Location: [facility] CATH LAB;  Service: Cardiovascular;  Laterality: N/A;   LIPOMA EXCISION Left 08/25/2017   Procedure: EXCISION LIPOMA LEFT POSTERIOR THIGH;  Surgeon: Clovis Riley, MD;  Location: [place];  Service: General;  Laterality: Left;   NEPHROLITHOTOMY  1990'S   OPEN APPENDECTOMY W/ PARTIAL CECECTOMY  05-16-2004   PERCUTANEOUS CORONARY STENT INTERVENTION (PCI-S) N/A 06/28/2012   Procedure: PERCUTANEOUS CORONARY STENT INTERVENTION (PCI-S);  Surgeon: Sherren Mocha, MD;  Location: [facility] CATH LAB;  Service: Cardiovascular;  Laterality: N/A;   POLYPECTOMY  11/20/2020   Procedure: POLYPECTOMY;  Surgeon: Ronald Lobo, MD;  [facility];  Service: Endoscopy;;   POLYPECTOMY  05/02/2021   Procedure: POLYPECTOMY;  Surgeon: Arta Silence, MD;  [facility];  Service: Endoscopy;;   RADIOLOGY WITH ANESTHESIA N/A 01/13/2021   Procedure: IR WITH ANESTHESIA - TIPS;  Surgeon: Suzette Battiest, MD;  Location: [place];  Service: Radiology;  Laterality: N/A;    SOCIAL HISTORY: Social History   Socioeconomic History   Marital status: Married    Spouse name: Not on file   Number of children: Not on file   Years of education: Not on file   Highest education  level: Not on file  Occupational History   Not on file  Tobacco Use   Smoking status: Former    Packs/day: 2.00    Years: 40.00    Pack years: 80.00    Types: Cigarettes    Quit date: 08/29/1996    Years since quitting: 25.1   Smokeless tobacco: Never  Vaping Use   Vaping Use: Never used  Substance and Sexual Activity   Alcohol use: Not Currently    Alcohol/week: 8.0 standard drinks    Types: 8 Cans of beer per week    Comment: 2 beers every other day   Drug use: No   Sexual activity: Yes  Other Topics Concern   Not on file  Social History Narrative   Lives in [place] with wife, son, and son's family.   Social Determinants of Health   Financial Resource Strain: Medium Risk   Difficulty of Paying  Living Expenses: Somewhat hard  Food Insecurity: No Food Insecurity   Worried About Charity fundraiser in the Last Year: Never true   Ran Out of Food in the Last Year: Never true  Transportation Needs: No Transportation Needs   Lack of Transportation (Medical): No   Lack of Transportation (Non-Medical): No  Physical Activity: Not on file  Stress: Not on file  Social Connections: Not on file  Intimate Partner Violence: Not on file    FAMILY HISTORY: Family History  Problem Relation Age of Onset   Lung cancer Sister    Cancer Sister        lung   Cancer Mother    Cancer Father        died in his 25s.   Cancer Brother        lung   Coronary artery disease Other    Diabetes Other    Colon cancer Other    Cancer Sister        lung    ALLERGIES:  is allergic to fluzone quadrivalent [influenza vac split quad].  MEDICATIONS:  Current Outpatient Medications  Medication Sig Dispense Refill   pantoprazole (PROTONIX) 40 MG tablet TAKE 1 TABLET(40 MG) BY MOUTH TWICE DAILY 180 tablet 3   Accu-Chek Softclix Lancets lancets USE AS DIRECTED TO TEST BLOOD SUGAR FOUR TIMES DAILY 300 each 3   albuterol (VENTOLIN HFA) 108 (90 Base) MCG/ACT inhaler Inhale 2 puffs into the lungs every 6 (six) hours as needed for wheezing or shortness of breath. 8 g 0   ANORO ELLIPTA 62.5-25 MCG/ACT AEPB Inhale 1 puff into the lungs daily. 1 each 1   blood glucose meter kit and supplies Dispense based on patient and insurance preference. Use up to four times daily as directed. (FOR ICD-10 E10.9, E11.9). 1 each 0   ferrous sulfate 325 (65 FE) MG tablet Take 1 tablet (325 mg total) by mouth daily with breakfast. (Patient not taking: Reported on 10/14/2021) 90 tablet 0   furosemide (LASIX) 40 MG tablet Take 1 tablet (40 mg total) by mouth daily. 90 tablet 1   lactulose (CHRONULAC) 10 GM/15ML solution TAKE 45 MLS BY MOUTH THREE TIMES DAILY 1892 mL 1   lidocaine (LIDODERM) 5 % Place 1 patch onto  the skin daily. Remove & Discard patch within 12 hours or as directed by MD 30 patch 0   rifaximin (XIFAXAN) 550 MG TABS tablet Take 1 tablet (550 mg total) by mouth 2 (two) times daily. 60 tablet 0   spironolactone (ALDACTONE) 50 MG tablet Take 1 tablet (50  mg total) by mouth daily. 90 tablet 1   No current facility-administered medications for this visit.    PHYSICAL EXAMINATION: ECOG PERFORMANCE STATUS: 1 - Symptomatic but completely ambulatory  There were no vitals filed for this visit.  There were no vitals filed for this visit.  PE not done, telephone visit  LABORATORY DATA:  I have reviewed the data as listed Lab Results  Component Value Date   WBC 8.0 10/19/2021   HGB 9.3 (L) 10/19/2021   HCT 30.1 (L) 10/19/2021   MCV 82.9 10/19/2021   PLT 162 10/19/2021     Chemistry      Component Value Date/Time   NA 133 (L) 10/19/2021 0837   NA 139 08/25/2020 1420   K 3.5 10/19/2021 0837   CL 98 10/19/2021 0837   CO2 26 10/19/2021 0837   BUN 12 10/19/2021 0837   BUN 26 08/25/2020 1420   CREATININE 1.07 10/19/2021 0837   CREATININE 1.13 05/20/2021 1444   CREATININE 0.75 06/09/2015 1609      Component Value Date/Time   CALCIUM 8.2 (L) 10/19/2021 0837   ALKPHOS 293 (H) 10/19/2021 0837   AST 53 (H) 10/19/2021 0837   AST 43 (H) 05/20/2021 1444   ALT 27 10/19/2021 0837   ALT 29 05/20/2021 1444   BILITOT 1.9 (H) 10/19/2021 0837   BILITOT 0.9 05/20/2021 1444      RADIOGRAPHIC STUDIES: I have personally reviewed the radiological images as listed and agreed with the findings in the report. NM PET Image Initial (PI) Skull Base To Thigh  Result Date: 10/15/2021 CLINICAL DATA:  Initial treatment strategy for right lower lobe pulmonary mass like opacity on CT. History of cirrhosis and hepatocellular carcinoma. EXAM: NUCLEAR MEDICINE PET SKULL BASE TO THIGH TECHNIQUE: 8.3 mCi F-18 FDG was injected intravenously. Full-ring PET imaging was performed from the skull base to thigh  after the radiotracer. CT data was obtained and used for attenuation correction and anatomic localization. Fasting blood glucose: 88 mg/dl COMPARISON:  Chest CT 09/10/2021.  Abdominopelvic CTA 09/16/2021. FINDINGS: Mediastinal blood pool activity: SUV max 2.0 NECK: There is an enlarged supraclavicular lymph node on the right (1.4 cm on image 48/4, SUV max 22.7). No other hypermetabolic cervical lymph nodes are identified.There are no lesions of the pharyngeal mucosal space. Incidental CT findings: Diffuse aortic and branch vessel atherosclerosis. CHEST: There is extensive intense multifocal hypermetabolic activity within multiple mediastinal and hilar lymph nodes as well as the right lung and right pleural space. 2.0 cm right paratracheal node on image 61/4 has an SUV max of 32.7. A right hilar mass measuring approximately 3.4 cm on image 81/4 has an SUV max of 29.0. A right lower lobe mass measuring approximately 4.9 x 4.3 cm on image 102/4 has an SUV max of 25.1. The right lung hypermetabolic activity is all peripheral and may be pleural based. There is a hypermetabolic left hilar lymph node, but no abnormal metabolic activity within the left lung or left pleural space. Incidental CT findings: Small right pleural effusion with associated pleural based metastases dependently. Underlying calcified mediastinal and hilar lymph nodes compatible with prior granulomatous disease. Diffuse atherosclerosis of the aorta, great vessels and coronary arteries status post median sternotomy and CABG. Probable calcifications of the aortic valve. ABDOMEN/PELVIS: There is no hypermetabolic activity within the liver, adrenal glands, spleen or pancreas. The treated lesion in the dome of the right hepatic lobe demonstrates no hypermetabolic activity. However, there are several intensely hypermetabolic retroperitoneal abdominal lymph nodes, including a  celiac node with an SUV max of 25.2 (largely obscured by artifact on the CT images),  a 9 mm right retrocrural node on image 114/4 (SUV max 17.8) and a small left periaortic node on image 136/4 (SUV max 12.0). No hypermetabolic activity within the pelvis. Incidental CT findings: Streak artifact in the upper abdomen from previous embolization procedure. TIPS shunt remains in place. Multiple calcified splenic granulomas, chronic right renal scarring and nonobstructing right renal calculi and a right-sided lumbar hernia are noted. Diffuse aortic and branch vessel atherosclerosis. SKELETON: No definite hypermetabolic activity to suggest osseous metastatic disease. There is extensive right paraspinal activity in the chest related to right-sided pleural disease. Incidental CT findings: Bilateral gynecomastia. IMPRESSION: 1. Intensely hypermetabolic activity throughout the right pleural space, mediastinal and hilar lymph nodes bilaterally consistent with widespread metastatic disease. There are prominent right hilar and lower lobe components, either of which could reflect primary bronchogenic carcinoma. Recommend tissue sampling. 2. In addition, there are a few intensely hypermetabolic retroperitoneal lymph nodes in the abdomen. These are likely related to the primary thoracic process. 3. No abnormal activity within the treated hepatocellular carcinoma in the lateral dome of the right hepatic lobe. 4. Additional incidental findings as above. Electronically Signed   By: Richardean Sale M.D.   On: 10/15/2021 15:23   IR TIPS REVISION MOD SED  Result Date: 10/19/2021 CLINICAL DATA:  76 year old male with history of cirrhosis (Child Pugh A, MELD-Na 11) with associated hemorrhagic gastroesophageal varices in addition to history of hepatocellular carcinoma status post TARE segmentectomy in November 2021. He is now status post TIPS creation on 01/13/21. He has continued to suffer from pancytopenia and anemia, with some evidence of GI tract etiology of blood loss. Given extensive gastroesophageal varices and  minimal TIPS stent dilation after placement, there maybe future room for further decompression of the portal system or coil embolization of recanalized gastroesophageal varices. EXAM: 1. Ultrasound-guided vascular access of the right internal jugular vein. 2. Catheterization and angiography of the portal vein. 3. Portal and central venous manometry. MEDICATIONS: As antibiotic prophylaxis, Rocephin 2 gm IV was ordered pre-procedure and administered intravenously within one hour of incision. ANESTHESIA/SEDATION: Moderate (conscious) sedation was employed during this procedure. A total of Versed 1 mg and Fentanyl 25 mcg was administered intravenously. Moderate Sedation Time: 23 minutes. The patient's level of consciousness and vital signs were monitored continuously by radiology nursing throughout the procedure under my direct supervision. CONTRAST:  40 mL Omnipaque 300, intravenous FLUOROSCOPY: Radiation Exposure Index (as provided by the fluoroscopic device): 90 mGy Kerma COMPLICATIONS: None immediate. PROCEDURE: Informed written consent was obtained from the patient after a thorough discussion of the procedural risks, benefits and alternatives. All questions were addressed. Maximal Sterile Barrier Technique was utilized including caps, mask, sterile gowns, sterile gloves, sterile drape, hand hygiene and skin antiseptic. A timeout was performed prior to the initiation of the procedure. The right neck was prepped and draped in standard fashion. Preprocedure ultrasound demonstrated patency of the right internal jugular vein. Procedure was planned. Subdermal Local anesthesia was administered at the planned needle entry site with 1% lidocaine. Small skin nick was made. Under direct ultrasound visualization, a 21 gauge micropuncture needle was directed into the right internal jugular vein. A ultrasound image was captured and stored the permanent record. Micropuncture sheath was placed, through which a Rosen wire was  directed to the inferior vena cava. Serial dilation was performed, ultimately allowing placement of a 10 French, 38.5 cm angled tip vascular sheath.  The sheath was positioned in the right atrium and the inner introducer was removed. Right atrial manometry was performed (10 mm Hg). Next, a 5 [place] MPA catheter was used to select the indwelling TIPS endograft and ultimately the main portal vein. A Glidewire Advantage was inserted and the MPA catheter was exchanged for a marking catheter. Main portal venogram was performed which demonstrated brisk antegrade flow of the portal system with wide patency of the indwelling endograft. Portal manometry was performed (18 mm Hg). The Glidewire Advantage was reinserted to select the splenic vein. The marking pigtail catheter was advanced to the mid splenic vein and repeat portal venogram was performed. There is no evidence of patent gastric varix arising from the splenic vein. The embolized varices demonstrated no evidence of recanalization. The pigtail catheter was straightened with a wire and removed. The indwelling sheath was removed and manual compression was applied to the right neck for 5 minutes until hemostasis was achieved. The patient tolerated procedure well and was transferred to the recovery area in good condition. IMPRESSION: 1. Widely patent TIPS with no evidence of significant portal hypertension (mean portosystemic gradient equals 8 mm Hg). 2. No evidence of patent gastroesophageal varices or recanalization of previously embolized varices arising from the splenic vein. PLAN: Follow-up in Interventional Radiology clinic in 6 months. No additional imaging is needed at that time. Ruthann Cancer, MD [facility] Radiology Electronically Signed   By: Ruthann Cancer M.D.   On: 10/19/2021 12:21    ASSESSMENT & PLAN:   [name] is a 76 y.o. male who returns for a follow-up for pancytopenia secondary to  cirrhosis.  #Pancytopenia 2/2 cirrhosis:  --Current plan is to provide supportive care with transfusion support.  --Labs today show Hgb of 7.4, white blood cell count of 3200 and platelet count of 160,000 --No indication for transfusion today. --RTC in 2 weeks for labs and FU.  We have once again discussed the reason for pancytopenia in this patient with liver disease and the need for periodic transfusions and supportive care. We are transfusing him for a hemoglobin of 7 g/dL or less unless active bleeding.  He is also not very symptomatic at least according to history from him today.  #Hx of hepatocellular carcinoma: --Treated with Y 90 back in November 2021 -- We have on multiple occasions discussed the importance of doing the MRI abdomen.  I have very clearly described this to the son as well.  I do not believe he still has his MRI done. We gave him the phone number to call during his last visit as well. No concern for [facility] on most recent PET scan.  #3 adenocarcinoma of lung, noted on pleural effusion.  I have discussed that since this is identified in the fluid, this indicates an advanced lung cancer, stage IV. Intensely hypermetabolic activity throughout the right pleural space, mediastinal and hilar lymph nodes bilaterally consistent with widespread metastatic disease. There are prominent right hilar and lower lobe components, either of which could reflect primary bronchogenic carcinoma.  I have recommended adding foundation 1 and PD-L1 testing on the pleural fluid sample but they do not have enough sample hence we sent for guardant 360. Unfortunately no targetable mutations on Guardant testing.  MSI Not detected. PDL1 cannot be tested on the blood apparently. Since he cant receive upfront immunotherapy or targeted therapy at this time and since he is not a candidate for any chemotherapy, I think he is best served by hospice.  Another option is to attempt a second biopsy to do PDL1  testing. At baseline he has a very poor PS, lack of understanding of prognosis and has cirrhosis and related pancytopenias, hence I would favor hospice.  I called all numbers provided to [place] but couldn't reach anyone to discuss Left a voicemail to call us back     Benay Pike, MD Hematology and Oncology [facility] P: [phone]     This encounter was created in error - please disregard. This encounter was created in error - please disregard.

## 2021-10-28 ENCOUNTER — Emergency Department (HOSPITAL_COMMUNITY): Payer: Medicare HMO

## 2021-10-28 ENCOUNTER — Emergency Department (HOSPITAL_COMMUNITY)
Admission: EM | Admit: 2021-10-28 | Discharge: 2021-10-28 | Disposition: A | Discharge status: Home / Self Care | Payer: Medicare HMO | Attending: Emergency Medicine | Admitting: Emergency Medicine

## 2021-10-28 ENCOUNTER — Encounter (HOSPITAL_COMMUNITY): Payer: Self-pay | Admitting: Emergency Medicine

## 2021-10-28 ENCOUNTER — Other Ambulatory Visit: Payer: Self-pay

## 2021-10-28 DIAGNOSIS — R7989 Other specified abnormal findings of blood chemistry: Secondary | ICD-10-CM | POA: Diagnosis not present

## 2021-10-28 DIAGNOSIS — W06XXXA Fall from bed, initial encounter: Secondary | ICD-10-CM | POA: Diagnosis not present

## 2021-10-28 DIAGNOSIS — Z043 Encounter for examination and observation following other accident: Secondary | ICD-10-CM | POA: Diagnosis not present

## 2021-10-28 DIAGNOSIS — I2510 Atherosclerotic heart disease of native coronary artery without angina pectoris: Secondary | ICD-10-CM | POA: Diagnosis not present

## 2021-10-28 DIAGNOSIS — N471 Phimosis: Secondary | ICD-10-CM | POA: Diagnosis not present

## 2021-10-28 DIAGNOSIS — R69 Illness, unspecified: Secondary | ICD-10-CM | POA: Diagnosis not present

## 2021-10-28 DIAGNOSIS — D696 Thrombocytopenia, unspecified: Secondary | ICD-10-CM | POA: Diagnosis not present

## 2021-10-28 DIAGNOSIS — R1084 Generalized abdominal pain: Secondary | ICD-10-CM | POA: Diagnosis not present

## 2021-10-28 DIAGNOSIS — R519 Headache, unspecified: Secondary | ICD-10-CM | POA: Insufficient documentation

## 2021-10-28 DIAGNOSIS — J90 Pleural effusion, not elsewhere classified: Secondary | ICD-10-CM | POA: Diagnosis not present

## 2021-10-28 DIAGNOSIS — R748 Abnormal levels of other serum enzymes: Secondary | ICD-10-CM | POA: Diagnosis not present

## 2021-10-28 DIAGNOSIS — M1990 Unspecified osteoarthritis, unspecified site: Secondary | ICD-10-CM | POA: Diagnosis not present

## 2021-10-28 DIAGNOSIS — N179 Acute kidney failure, unspecified: Secondary | ICD-10-CM | POA: Insufficient documentation

## 2021-10-28 DIAGNOSIS — D61811 Other drug-induced pancytopenia: Secondary | ICD-10-CM | POA: Diagnosis not present

## 2021-10-28 DIAGNOSIS — G928 Other toxic encephalopathy: Secondary | ICD-10-CM | POA: Diagnosis not present

## 2021-10-28 DIAGNOSIS — E119 Type 2 diabetes mellitus without complications: Secondary | ICD-10-CM | POA: Diagnosis not present

## 2021-10-28 DIAGNOSIS — R112 Nausea with vomiting, unspecified: Secondary | ICD-10-CM | POA: Diagnosis not present

## 2021-10-28 DIAGNOSIS — K745 Biliary cirrhosis, unspecified: Secondary | ICD-10-CM | POA: Diagnosis not present

## 2021-10-28 DIAGNOSIS — R188 Other ascites: Secondary | ICD-10-CM | POA: Diagnosis not present

## 2021-10-28 DIAGNOSIS — T40711D Poisoning by cannabis, accidental (unintentional), subsequent encounter: Secondary | ICD-10-CM | POA: Diagnosis not present

## 2021-10-28 DIAGNOSIS — Z8505 Personal history of malignant neoplasm of liver: Secondary | ICD-10-CM | POA: Diagnosis not present

## 2021-10-28 DIAGNOSIS — R000 Tachycardia, unspecified: Secondary | ICD-10-CM | POA: Diagnosis not present

## 2021-10-28 DIAGNOSIS — S0990XA Unspecified injury of head, initial encounter: Secondary | ICD-10-CM | POA: Diagnosis not present

## 2021-10-28 DIAGNOSIS — D509 Iron deficiency anemia, unspecified: Secondary | ICD-10-CM | POA: Diagnosis not present

## 2021-10-28 DIAGNOSIS — S199XXA Unspecified injury of neck, initial encounter: Secondary | ICD-10-CM | POA: Diagnosis not present

## 2021-10-28 DIAGNOSIS — K279 Peptic ulcer, site unspecified, unspecified as acute or chronic, without hemorrhage or perforation: Secondary | ICD-10-CM | POA: Diagnosis not present

## 2021-10-28 DIAGNOSIS — M4312 Spondylolisthesis, cervical region: Secondary | ICD-10-CM | POA: Diagnosis not present

## 2021-10-28 DIAGNOSIS — M1000 Idiopathic gout, unspecified site: Secondary | ICD-10-CM | POA: Diagnosis not present

## 2021-10-28 DIAGNOSIS — E785 Hyperlipidemia, unspecified: Secondary | ICD-10-CM | POA: Diagnosis not present

## 2021-10-28 DIAGNOSIS — M542 Cervicalgia: Secondary | ICD-10-CM | POA: Diagnosis not present

## 2021-10-28 DIAGNOSIS — W19XXXA Unspecified fall, initial encounter: Secondary | ICD-10-CM

## 2021-10-28 DIAGNOSIS — Z85118 Personal history of other malignant neoplasm of bronchus and lung: Secondary | ICD-10-CM | POA: Diagnosis not present

## 2021-10-28 DIAGNOSIS — M25512 Pain in left shoulder: Secondary | ICD-10-CM | POA: Diagnosis present

## 2021-10-28 DIAGNOSIS — Z7951 Long term (current) use of inhaled steroids: Secondary | ICD-10-CM | POA: Diagnosis not present

## 2021-10-28 DIAGNOSIS — E876 Hypokalemia: Secondary | ICD-10-CM | POA: Diagnosis not present

## 2021-10-28 DIAGNOSIS — K766 Portal hypertension: Secondary | ICD-10-CM | POA: Diagnosis not present

## 2021-10-28 DIAGNOSIS — I6523 Occlusion and stenosis of bilateral carotid arteries: Secondary | ICD-10-CM | POA: Diagnosis not present

## 2021-10-28 DIAGNOSIS — I5032 Chronic diastolic (congestive) heart failure: Secondary | ICD-10-CM | POA: Diagnosis not present

## 2021-10-28 DIAGNOSIS — G4733 Obstructive sleep apnea (adult) (pediatric): Secondary | ICD-10-CM | POA: Diagnosis not present

## 2021-10-28 DIAGNOSIS — Z951 Presence of aortocoronary bypass graft: Secondary | ICD-10-CM | POA: Diagnosis not present

## 2021-10-28 DIAGNOSIS — K219 Gastro-esophageal reflux disease without esophagitis: Secondary | ICD-10-CM | POA: Diagnosis not present

## 2021-10-28 DIAGNOSIS — I110 Hypertensive heart disease with heart failure: Secondary | ICD-10-CM | POA: Diagnosis not present

## 2021-10-28 DIAGNOSIS — I864 Gastric varices: Secondary | ICD-10-CM | POA: Diagnosis not present

## 2021-10-28 DIAGNOSIS — I700 Atherosclerosis of aorta: Secondary | ICD-10-CM | POA: Diagnosis not present

## 2021-10-28 DIAGNOSIS — M545 Low back pain, unspecified: Secondary | ICD-10-CM | POA: Diagnosis not present

## 2021-10-28 DIAGNOSIS — I739 Peripheral vascular disease, unspecified: Secondary | ICD-10-CM | POA: Diagnosis not present

## 2021-10-28 LAB — CBC WITH DIFFERENTIAL/PLATELET
Abs Immature Granulocytes: 0.02 10*3/uL (ref 0.00–0.07)
Basophils Absolute: 0 10*3/uL (ref 0.0–0.1)
Basophils Relative: 0 %
Eosinophils Absolute: 0.1 10*3/uL (ref 0.0–0.5)
Eosinophils Relative: 1 %
HCT: 28.6 % — ABNORMAL LOW (ref 39.0–52.0)
Hemoglobin: 8.6 g/dL — ABNORMAL LOW (ref 13.0–17.0)
Immature Granulocytes: 0 %
Lymphocytes Relative: 9 %
Lymphs Abs: 0.5 10*3/uL — ABNORMAL LOW (ref 0.7–4.0)
MCH: 24.9 pg — ABNORMAL LOW (ref 26.0–34.0)
MCHC: 30.1 g/dL (ref 30.0–36.0)
MCV: 82.9 fL (ref 80.0–100.0)
Monocytes Absolute: 0.4 10*3/uL (ref 0.1–1.0)
Monocytes Relative: 7 %
Neutro Abs: 4.7 10*3/uL (ref 1.7–7.7)
Neutrophils Relative %: 83 %
Platelets: 120 10*3/uL — ABNORMAL LOW (ref 150–400)
RBC: 3.45 MIL/uL — ABNORMAL LOW (ref 4.22–5.81)
RDW: 20.2 % — ABNORMAL HIGH (ref 11.5–15.5)
WBC: 5.7 10*3/uL (ref 4.0–10.5)
nRBC: 0 % (ref 0.0–0.2)

## 2021-10-28 LAB — COMPREHENSIVE METABOLIC PANEL
ALT: 32 U/L (ref 0–44)
AST: 57 U/L — ABNORMAL HIGH (ref 15–41)
Albumin: 2.2 g/dL — ABNORMAL LOW (ref 3.5–5.0)
Alkaline Phosphatase: 505 U/L — ABNORMAL HIGH (ref 38–126)
Anion gap: 11 (ref 5–15)
BUN: 21 mg/dL (ref 8–23)
CO2: 22 mmol/L (ref 22–32)
Calcium: 8.5 mg/dL — ABNORMAL LOW (ref 8.9–10.3)
Chloride: 101 mmol/L (ref 98–111)
Creatinine, Ser: 1.37 mg/dL — ABNORMAL HIGH (ref 0.61–1.24)
GFR, Estimated: 54 mL/min — ABNORMAL LOW (ref >60)
Glucose, Bld: 130 mg/dL — ABNORMAL HIGH (ref 70–99)
Potassium: 3.2 mmol/L — ABNORMAL LOW (ref 3.5–5.1)
Sodium: 134 mmol/L — ABNORMAL LOW (ref 135–145)
Total Bilirubin: 1.4 mg/dL — ABNORMAL HIGH (ref 0.3–1.2)
Total Protein: 6.3 g/dL — ABNORMAL LOW (ref 6.5–8.1)

## 2021-10-28 MED ORDER — POTASSIUM CHLORIDE CRYS ER 20 MEQ PO TBCR
40.0000 meq | EXTENDED_RELEASE_TABLET | Freq: Once | ORAL | Status: AC
  Administered 2021-10-28: 40 meq via ORAL
  Filled 2021-10-28: qty 2

## 2021-10-28 MED ORDER — ONDANSETRON HCL 4 MG PO TABS: 4.0000 mg | ORAL_TABLET | Freq: Three times a day (TID) | ORAL | 0 refills | Status: AC | PRN

## 2021-10-28 MED ORDER — ONDANSETRON HCL 4 MG/2ML IJ SOLN
4.0000 mg | Freq: Once | INTRAMUSCULAR | Status: DC
  Administered 2021-10-28: 4 mg via INTRAVENOUS
  Filled 2021-10-28: qty 2

## 2021-10-28 NOTE — ED Triage Notes (Addendum)
Patient brought in by son reporting fall out of bed today around 3pm. Denies hitting head. Patient is dry heaving in triage. Son reports he has n/v constantly and is not a new issue. Son reports recent liver surgery.  ?

## 2021-10-28 NOTE — ED Provider Notes (Signed)
[place] DEPT Provider Note   CSN: [id number] Arrival date & time: 10/28/21  [place]     History  Chief Complaint  Patient presents with   [name]    [name] is a 76 y.o. 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  On arrival patient's son reports that he has been having some new nausea, vomiting, decreased appetite since TIPS revision 3 weeks ago.  Acutely today he is most concerned about recent fall.  He does not take any blood thinners but he does have thrombocytopenia associated with his liver disease.  He reports he may have hit his head but did not lose consciousness, he also endorses some neck and left shoulder pain.   Fall Associated symptoms include abdominal pain.      Home Medications Prior to Admission medications   Medication Sig Start Date End Date Taking? Authorizing Provider  ondansetron (ZOFRAN) 4 MG tablet Take 1 tablet (4 mg total) by mouth every 8 (eight) hours as needed for nausea or vomiting. 10/28/21  Yes Joevanni Roddey H, PA-C  pantoprazole (PROTONIX) 40 MG tablet TAKE 1 TABLET(40 MG) BY MOUTH TWICE DAILY 10/26/21   [name], MD  Accu-Chek Softclix Lancets lancets USE AS DIRECTED TO TEST BLOOD SUGAR FOUR TIMES DAILY 03/13/20   [name], MD  albuterol (VENTOLIN HFA) 108 (90 Base) MCG/ACT inhaler Inhale 2 puffs into the lungs every 6 (six) hours as needed for wheezing or shortness of breath. 03/01/21   Aline August, MD  ANORO ELLIPTA 62.5-25 MCG/ACT AEPB Inhale 1  puff into the lungs daily. 07/25/21   Mercy Riding, MD  blood glucose meter kit and supplies Dispense based on patient and insurance preference. Use up to four times daily as directed. (FOR ICD-10 E10.9, E11.9). 03/12/20   [name], MD  ferrous sulfate 325 (65 FE) MG tablet Take 1 tablet (325 mg total) by mouth daily with breakfast. Patient not taking: Reported on 10/14/2021 05/06/21 09/16/22  [name], MD  furosemide (LASIX) 40 MG tablet Take 1 tablet (40 mg total) by mouth daily. 07/25/21 10/23/21  Mercy Riding, MD  lactulose (CHRONULAC) 10 GM/15ML solution TAKE 45 MLS BY MOUTH THREE TIMES DAILY 10/26/21   [name], MD  lidocaine (LIDODERM) 5 % Place 1 patch onto the skin daily. Remove & Discard patch within 12 hours or as directed by MD 10/12/21   [name], MD  rifaximin (XIFAXAN) 550 MG TABS tablet Take 1 tablet (550 mg total) by mouth 2 (two) times daily. 09/12/21   Aline August, MD  spironolactone (ALDACTONE) 50 MG tablet Take 1 tablet (50 mg total) by mouth daily. 07/25/21   Mercy Riding, MD      Allergies    Fluzone quadrivalent [influenza vac split quad]    Review of Systems   Review of Systems  Gastrointestinal:  Positive for abdominal pain and nausea.  Musculoskeletal:  Positive for myalgias.  All other systems reviewed and are negative.  Physical Exam Updated Vital Signs BP 134/70    Pulse 92  Temp 97.8 F (36.6 C) (Oral)    Resp (!) 26    Ht '5\' 8"'  (1.727 m)    Wt 72.6 kg    SpO2 98%    BMI 24.34 kg/m  Physical Exam Vitals and nursing note reviewed.  Constitutional:      General: He is not in acute distress.    Appearance: Normal appearance. He is ill-appearing.  HENT:     Head: Normocephalic and atraumatic.  Eyes:     General:        Right eye: No discharge.        Left eye: No discharge.  Cardiovascular:     Rate and Rhythm: Normal rate and regular rhythm.     Heart sounds: No murmur heard.   No friction rub. No gallop.   Pulmonary:     Effort: Pulmonary effort is normal.     Breath sounds: Normal breath sounds.  Abdominal:     General: Bowel sounds are normal.     Palpations: Abdomen is soft.     Comments: Generalized tenderness to palpation slightly worse in the right upper quadrant, no significant ecchymosis, rebound, rigidity, guarding noted  Skin:    General: Skin is warm and dry.     Capillary Refill: Capillary refill takes less than 2 seconds.  Neurological:     Mental Status: He is alert and oriented to person, place, and time.     Comments: Cranial nerves II through XII grossly intact. Alert and oriented x3.  Moves all 4 limbs spontaneously, normal coordination.  No pronator drift.  Intact strength 4 out of 5 bilateral upper and lower extremities.  Romberg and gait deferred secondary to weakness.  Patient with some global weakness throughout.    Psychiatric:        Mood and Affect: Mood normal.        Behavior: Behavior normal.    ED Results / Procedures / Treatments   Labs (all labs ordered are listed, but only abnormal results are displayed) Labs Reviewed  CBC WITH DIFFERENTIAL/PLATELET - Abnormal; Notable for the following components:      Result Value   RBC 3.45 (*)    Hemoglobin 8.6 (*)    HCT 28.6 (*)    MCH 24.9 (*)    RDW 20.2 (*)    Platelets 120 (*)    Lymphs Abs 0.5 (*)    All other components within normal limits  COMPREHENSIVE METABOLIC PANEL - Abnormal; Notable for the following components:   Sodium 134 (*)    Potassium 3.2 (*)    Glucose, Bld 130 (*)    Creatinine, Ser 1.37 (*)    Calcium 8.5 (*)    Total Protein 6.3 (*)    Albumin 2.2 (*)    AST 57 (*)    Alkaline Phosphatase 505 (*)    Total Bilirubin 1.4 (*)    GFR, Estimated 54 (*)    All other components within normal limits    EKG EKG Interpretation  Date/Time:  Thursday October 28 2021 20:20:37 EST Ventricular Rate:  107 PR Interval:    QRS Duration: 117 QT Interval:  398 QTC Calculation: 531 R  Axis:   101 Text Interpretation: sinus arrhythmia/tachycardia Incomplete right bundle branch block Nonspecific T abnormalities, lateral leads No significant change since 11/22 Confirmed by Aletta Edouard [phone]) on 10/28/2021 9:09:18 PM  Radiology CT Head Wo Contrast  Result Date: 10/28/2021 CLINICAL DATA:  Trauma, fall EXAM: CT HEAD WITHOUT CONTRAST TECHNIQUE: Contiguous axial images  were obtained from the base of the skull through the vertex without intravenous contrast. RADIATION DOSE REDUCTION: This exam was performed according to the departmental dose-optimization program which includes automated exposure control, adjustment of the mA and/or kV according to patient size and/or use of iterative reconstruction technique. COMPARISON:  09/16/2021 FINDINGS: Brain: No acute intracranial findings are seen. There are no epidural or subdural fluid collections. Cortical sulci are prominent. There is decreased density in the periventricular white matter. Vascular: There are scattered arterial calcifications. Skull: Unremarkable. Sinuses/Orbits: There is mild mucosal thickening in the ethmoid sinus. There are small calcifications in the region of optic discs on both sides, possibly optic nerve head drusen. This finding has been noted in the previous studies dating as far back as 05/25/2018. Other: None IMPRESSION: No acute intracranial findings are seen in noncontrast CT brain. Atrophy. Small-vessel disease. Other findings as described in the body of the report. Electronically Signed   By: Elmer Picker M.D.   On: 10/28/2021 20:13   CT Cervical Spine Wo Contrast  Result Date: 10/28/2021 CLINICAL DATA:  Trauma, fall EXAM: CT CERVICAL SPINE WITHOUT CONTRAST TECHNIQUE: Multidetector CT imaging of the cervical spine was performed without intravenous contrast. Multiplanar CT image reconstructions were also generated. RADIATION DOSE REDUCTION: This exam was performed according to the departmental dose-optimization  program which includes automated exposure control, adjustment of the mA and/or kV according to patient size and/or use of iterative reconstruction technique. COMPARISON:  None. FINDINGS: Alignment: There is minimal 2-3 mm anterolisthesis at C4-C5 level which may be due to previous ligament injury and facet degeneration. Marked degenerative changes are noted in the right facet joint at C4-C5 level. Skull base and vertebrae: No recent fracture is seen. Small smooth marginated calcification adjacent to the tip of spinous process of C7 vertebra may be residual from previous injury. Degenerative changes are noted with small bony spurs at multiple levels. There is 7 mm low-density in the odontoid process of C2 vertebra, possibly subcortical cyst due to degenerative arthritis in the atlantoaxial joint. Small Schmorl's nodes are seen in the lower endplates of bodies of C2 and C3 vertebrae. Soft tissues and spinal canal: There is no significant central spinal stenosis. Disc levels: There is encroachment of neural foramina from C2 to C7 levels. Upper chest: There is moderate to large right pleural effusion. Other: Scattered calcifications are seen in the carotid arteries on both sides. IMPRESSION: No recent fracture is seen. There is minimal 2-3 mm anterolisthesis at C4-C5 level which most likely is residual from remote ligament injury and facet degeneration. There is no significant central spinal stenosis. There is encroachment of neural foramina at multiple levels as described in the body of the report. Degenerative changes are noted in facet joints, particularly severe at C4-C5 level. Moderate to large right pleural effusion which is not fully evaluated in this study. Electronically Signed   By: Elmer Picker M.D.   On: 10/28/2021 20:20   DG Shoulder Left  Result Date: 10/28/2021 CLINICAL DATA:  Fall EXAM: LEFT SHOULDER - 2+ VIEW COMPARISON:  None. FINDINGS: There is no evidence of fracture or dislocation. There  is no evidence of arthropathy or other focal bone abnormality. Soft tissues are unremarkable. IMPRESSION: Negative. Electronically Signed   By: Ulyses Jarred M.D.   On: 10/28/2021 20:19    Procedures Procedures    Medications Ordered in ED Medications  potassium chloride SA (KLOR-CON M) CR tablet 40 mEq (40 mEq Oral Given 10/28/21 2141)    ED Course/  Medical Decision Making/ A&P                           Medical Decision Making Amount and/or Complexity of Data Reviewed Labs: ordered. Radiology: ordered.  Risk Prescription drug management.   I discussed this case with my attending physician who cosigned this note including patient's presenting symptoms, physical exam, and planned diagnostics and interventions. Attending physician stated agreement with plan or made changes to plan which were implemented.   This patient presents to the ED for concern of fall out of bed around 3 PM, patient complaining of pain in head, neck, left shoulder.  He denies any loss of consciousness.  He does not have any blood thinners. This involves an extensive number of treatment options, and is a complaint that carries with it a high risk of complications and morbidity. The emergent differential diagnosis prior to evaluation includes, but is not limited to, acute intracranial bleed, cerebral contusion, cervical spine fracture, shoulder fracture or dislocation..   Past Medical History / Co-morbidities: Complicated recent history of alcoholic cirrhosis of liver, hepatocellular carcinoma, and lung cancer  Additional history: Additional history obtained from patient's son. External records from outside source obtained and reviewed including recent oncology, and other outpatient coordination visits.  Physical Exam: Physical exam performed. The pertinent findings include: Overall ill-appearing man with no significant physical deformities, lacerations, bruising.  He has some tenderness to palpation of the abdomen  which he reports is similar to recent.  Lab Tests: I ordered, and personally interpreted labs.  The pertinent results include: Patient with significant abnormalities to both CBC, CMP, I will highlight the changes from recent.  He has significant elevation of his alkaline phosphatase, in context of 2 known malignancies, concern for possible additional bony metastases, patient is already being seen by oncology, as well as palliative care.  He is hypokalemic with potassium of 3.2, we will orally replete.  He is 0.3 elevation of creatinine from around 9 days ago.  This would represent an acute kidney injury, unclear etiology, discussed dehydration versus hepatorenal syndrome.  As patient is just at the cusp for AKI at this time, and did not present for this issue discussed I recommend oral rehydration, and close follow-up with primary care team.  Patient understands agrees to this plan.  His CBC is significant for hemoglobin slightly decreased from recent at 8.6 today from 9.3 9 days ago, his platelets are decreased at 120 from 162 2 days ago.   Imaging Studies: I ordered imaging studies including CT cervical spine, plain film radiograph of the shoulder, and CT head without contrast. I independently visualized and interpreted imaging which showed no new fractures, or listhesis.  Some chronic C4, C5 listhesis, and central canal narrowing which does not appear acute in nature.  Chronic vascular changes noted CT head, no abnormality noted on radiograph of the shoulder. I agree with the radiologist interpretation.   Cardiac Monitoring:  The patient was maintained on a cardiac monitor.  My attending physician Dr. Melina Copa viewed and interpreted the cardiac monitored which showed an underlying rhythm of: Sinus rhythm, some tachycardia.  Tachycardia resolved prior to discharge.   Medications: I ordered medication including Zofran for nausea, potassium chloride for hypokalemia.  Patient with slightly improved nausea  at time of discharge.  Disposition: After consideration of the diagnostic results and the patients response to treatment, I feel that patient is stable for discharge from an acute work-up for recent fall perspective.  Discussed with patient and his son that he has ongoing significant disease process and should continue to follow-up with his primary care doctor and oncologist at this time.  We discussed his acute changes including slight kidney injury, and other blood work abnormalities today.  Patient and son do feel comfortable with discharge at this time, and plan for close follow-up with his other specialist.  We will provide him with a short prescription for Zofran, with hepatic dosing.  Extensive return precautions given.  Patient discharged in stable condition at this time.   Final Clinical Impression(s) / ED Diagnoses Final diagnoses:  Fall, initial encounter    Rx / DC Orders ED Discharge Orders          Ordered    ondansetron (ZOFRAN) 4 MG tablet  Every 8 hours PRN        10/28/21 2139              Dorien Chihuahua 10/28/21 2153    Hayden Rasmussen, MD 10/29/21 [phone]

## 2021-10-28 NOTE — Discharge Instructions (Addendum)
As we discussed you had mildly elevated kidney function I recommend that you drink plenty of fluids, and follow-up with your primary care team.  He did not have any evidence of injury from your fall today.  You can use the nausea medication I am prescribing but limit to every 8 hours, and a total of only 2 tablets/day as it is cleared by your lower and you do not want to exceed your bodies capacity to clear this medication. ? ?If you have more falls, or other new symptoms please return to the emergency department for further evaluation.  ?

## 2021-10-29 ENCOUNTER — Telehealth: Payer: Self-pay | Admitting: Internal Medicine

## 2021-10-29 NOTE — Telephone Encounter (Signed)
[facility] calling in ? ?Calling to report patient fell out of bed yesterday 03.02.23 but does not report any injuries ?

## 2021-10-29 NOTE — Telephone Encounter (Signed)
Noted  

## 2021-11-01 ENCOUNTER — Other Ambulatory Visit: Payer: Medicare HMO | Admitting: Internal Medicine

## 2021-11-01 ENCOUNTER — Other Ambulatory Visit: Payer: Self-pay

## 2021-11-01 NOTE — Progress Notes (Deleted)
[facility] Consult Note Telephone: [phone]  Fax: [phone]   Date of encounter: 11/01/21 9:09 AM PATIENT NAME: [name] [place] [id number]   [phone] (home)  DOB: 1945-12-17 MRN: [id number] PRIMARY CARE PROVIDER:    [name], MD,  [place] [address] [phone]  REFERRING PROVIDER:   [name], MD [address] [place],  [place] [address] [phone]  RESPONSIBLE PARTY:    Contact Information     Name Relation Home Work McKinnon Son [phone]  [phone]   Alvino, Lechuga [phone]  [phone]       Due to the COVID-19 crisis, this visit was done via telemedicine from my office and it was initiated and consent by this patient and or family.  An attempt at video connection was attempted and failed--pt not able to make video connection so visit continued by telephone.  I connected with  [name] OR PROXY on 11/01/21 by a video enabled telemedicine application and verified that I am speaking with the correct person using two identifiers.   I discussed the limitations of evaluation and management by telemedicine. The patient expressed understanding and agreed to proceed.   Palliative Care was asked to follow this patient by consultation request of  [name], * to address advance care planning and complex medical decision making. This is the initial visit.                                     ASSESSMENT AND PLAN / RECOMMENDATIONS:   Advance Care Planning/Goals of Care: Goals include to maximize quality of life and symptom management. Patient/health care surrogate gave his/her permission to discuss.Our advance care planning conversation included a discussion about:    The value and importance of advance care planning  Experiences with loved ones who have been seriously ill or have died  Exploration  of personal, cultural or spiritual beliefs that might influence medical decisions  Exploration of goals of care in the event of a sudden injury or illness  Identification  of a healthcare agent  Review and updating or creation of an  advance directive document . Decision not to resuscitate or to de-escalate disease focused treatments due to poor prognosis. CODE STATUS:  Previously completed MOST from 08/12/20 showed full code, full scope, abx if indicated, IVF trial and no feeding tube--completed with hospital palliative care NP, pt and hcpoa present (son, [name])  Symptom Management/Plan:    Follow up Palliative Care Visit: Palliative care will continue to follow for complex medical decision making, advance care planning, and clarification of goals. Return *** weeks or prn.   This visit was coded based on medical decision making (MDM).***  PPS: ***0%  HOSPICE ELIGIBILITY/DIAGNOSIS: TBD  Chief Complaint: ***  HISTORY OF PRESENT ILLNESS:  [name] is a 76 y.o. year old male  with h/o hepatocellular ca s/p coil embolization and Y 90 in 59/7416, alcoholic cirrhosis (quit drinking 4 yrs ago), pancytopenia, generalized weakness, and newly identified right lung adenocarcinoma with right-sided malignant pleural effusion (thoracentesis with 1300cc fluid with malignant cells c/w adenoca but markers could not be tested), PET scan 10/14/21 with right pleura, mediastinal and hilar nodes, retroperitoneal lymph nodes, but no abnormality in liver), then 2/21 TIPS revision shown with imaging showing widely patent.  Per last oncology note, "since he cant receive upfront immunotherapy or targeted therapy at this time and since he is not a candidate for any chemotherapy, I think he is best served by hospice.  Another option is to attempt a second biopsy to do PDL1 testing.  At baseline he has a very poor PS, lack of understanding of prognosis and has cirrhosis and related pancytopenias, hence I would  favor hospice."    He has lost weight down to 160.05 lbs on 10/28/21 with BMI 24.34 from September 12, 2021 when he was 182.54 lbs.  The following day, he sustained a fall and had CT brain with small vessel ischemic disease and atrophy, negative CT c spine and negative xray left shoulder.  Review of note from ED indicates pt also had new nausea, vomiting and decreased appetite since his TIPS revision.     History obtained from review of EMR, discussion with primary team, and interview with family, facility staff/caregiver and/or Mr. Sunderland.   I reviewed available labs, medications, imaging, studies and related documents from the EMR.  Records reviewed and summarized above.   ROS  *** General: NAD EYES: denies vision changes ENMT: denies dysphagia Cardiovascular: denies chest pain, denies DOE Pulmonary: denies cough, denies increased SOB Abdomen: endorses good appetite, denies constipation, endorses continence of bowel GU: denies dysuria, endorses continence of urine MSK:  denies increased weakness,  no falls reported Skin: denies rashes or wounds Neurological: denies pain, denies insomnia Psych: Endorses positive mood Heme/lymph/immuno: denies bruises, abnormal bleeding  Physical Exam: Current and past weights: Constitutional: NAD General: frail appearing, thin/WNWD/obese  EYES: anicteric sclera, lids intact, no discharge  ENMT: intact hearing, oral mucous membranes moist, dentition intact CV: S1S2, RRR, no LE edema Pulmonary: LCTA, no increased work of breathing, no cough, room air Abdomen: intake 100%, normo-active BS + 4 quadrants, soft and non tender, no ascites GU: deferred MSK: no sarcopenia, moves all extremities, ambulatory Skin: warm and dry, no rashes or wounds on visible skin Neuro:  no generalized weakness,  no cognitive impairment Psych: non-anxious affect, A and O x 3 Hem/lymph/immuno: no widespread bruising CURRENT PROBLEM LIST:  Patient Active Problem List    Diagnosis Date Noted   Malignant pleural effusion 10/13/2021   Primary lung adenocarcinoma, right ([facility]) 09/28/2021   Lactic acidosis 09/08/2021   GERD (gastroesophageal reflux disease) 09/08/2021   Chronic diastolic CHF (congestive heart failure) (HCC)    Generalized weakness    Hypokalemia 07/23/2021   Hepatocellular carcinoma ([facility]) 08/09/2020   Routine general medical examination at a health care facility 05/06/2020   Iron deficiency anemia due to chronic blood loss    Pancytopenia ([facility]) 04/15/2020   Pulmonary hypertension, unspecified ([facility]) 03/11/2020   Asthma, mild intermittent 19/14/7829   Alcoholic cirrhosis of liver with ascites ([facility]) 12/27/2019   Acute on chronic anemia 12/18/2017   Peptic ulcer disease 12/18/2017   SOB (shortness of breath) 09/21/2017   Obesity (BMI 30.0-34.9) 02/05/2016   S/P CABG x 3 03/31/2014   Hepatic cirrhosis ([facility]) 02/27/2014   Melena 08/14/2012   Hyperlipidemia with target LDL less than 70 06/29/2012   Thrombocytopenia ([facility]) 06/29/2012   Coronary artery disease involving native coronary artery of native heart without angina pectoris    OSA (obstructive sleep apnea)    Type 2 diabetes with complication ([facility]) 56/21/3086   History of gout    Hypertensive heart disease    PAST MEDICAL HISTORY:  Active Ambulatory Problems    Diagnosis Date Noted   Type  2 diabetes with complication ([facility]) 81/27/5170   History of gout    Hypertensive heart disease    OSA (obstructive sleep apnea)    Coronary artery disease involving native coronary artery of native heart without angina pectoris    Hyperlipidemia with target LDL less than 70 06/29/2012   Thrombocytopenia ([facility]) 06/29/2012   Melena 08/14/2012   Hepatic cirrhosis ([facility]) 02/27/2014   S/P CABG x 3 03/31/2014   Obesity (BMI 30.0-34.9) 02/05/2016   SOB (shortness of breath) 09/21/2017   Acute on chronic anemia 12/18/2017   Peptic ulcer disease 12/18/2017   Asthma, mild intermittent 01/74/9449    Alcoholic cirrhosis of liver with ascites ([facility]) 12/27/2019   Pulmonary hypertension, unspecified ([facility]) 03/11/2020   Pancytopenia ([facility]) 04/15/2020   Iron deficiency anemia due to chronic blood loss    Routine general medical examination at a health care facility 05/06/2020   Hepatocellular carcinoma ([facility]) 08/09/2020   Hypokalemia 07/23/2021   Lactic acidosis 09/08/2021   GERD (gastroesophageal reflux disease) 09/08/2021   Chronic diastolic CHF (congestive heart failure) (HCC)    Generalized weakness    Primary lung adenocarcinoma, right ([facility]) 09/28/2021   Malignant pleural effusion 10/13/2021   Resolved Ambulatory Problems    Diagnosis Date Noted   GERD without esophagitis 67/59/1638   HERNIA, UMBILICAL 46/65/9935   RENAL CALCULUS, HX OF 08/04/2010   TOBACCO USE, QUIT 08/04/2010   SLEEP APNEA 09/15/2010   Edema    Abnormal stress echo 06/10/2012   Acute blood loss anemia 08/13/2012   Gastric ulcer 08/13/2012   Unstable angina ([facility]) 03/27/2014   History of GI bleed    Edema 05/14/2014   Knee contusion 11/07/2014   Numerous moles 09/15/2015   Exertional dyspnea 02/05/2016   Subcutaneous cyst 04/17/2017   GI bleed 09/29/2017   AKI (acute kidney injury) ([facility]) 10/24/2017   Cellulitis 11/20/2017   Upper GI bleed 12/18/2017   Neck pain 09/12/2018   Hyponatremia 12/24/2018   (HFpEF) heart failure with preserved ejection fraction ([facility]) 12/24/2018   Precordial chest pain    Itching 03/22/2019   Skin lesion 03/22/2019   Volume overload 07/29/2019   Acute on chronic diastolic CHF (congestive heart failure) ([facility]) 11/01/2019   COVID-19 virus infection 11/04/2019   Type 2 diabetes mellitus with complications ([facility]) 70/17/7939   Acute on chronic diastolic (congestive) heart failure ([facility]) 12/27/2019   Acute exacerbation of CHF (congestive heart failure) ([facility]) 12/27/2019   GI bleeding 09/10/2020   Hepatic encephalopathy 01/22/2021   Acute GI bleeding 02/27/2021   Acute on chronic  anemia 04/28/2021   Acute upper GI bleed 04/29/2021   Cirrhosis ([facility]) 04/29/2021   Acute hepatic encephalopathy 06/15/2021   Toxic encephalopathy 09/16/2021   Past Medical History:  Diagnosis Date   Arthritis    Carcinoma of liver, hepatocellular ([facility]) 06/30/2020   Chronic lower back pain    Diastolic heart failure (HCC)    History of blood transfusion    History of concussion    History of gout    Hyperlipidemia    Iron deficiency anemia    Kidney stones    OA (osteoarthritis of spine)    Phimosis    Type 2 diabetes mellitus ([facility])    Unspecified essential hypertension    SOCIAL HX:  Social History   Tobacco Use   Smoking status: Former    Packs/day: 2.00    Years: 40.00    Pack years: 80.00    Types: Cigarettes    Quit date: 08/29/1996  Years since quitting: 25.1   Smokeless tobacco: Never  Substance Use Topics   Alcohol use: Not Currently    Alcohol/week: 8.0 standard drinks    Types: 8 Cans of beer per week    Comment: 2 beers every other day   FAMILY HX:  Family History  Problem Relation Age of Onset   Lung cancer Sister    Cancer Sister        lung   Cancer Mother    Cancer Father        died in his 11s.   Cancer Brother        lung   Coronary artery disease Other    Diabetes Other    Colon cancer Other    Cancer Sister        lung      ALLERGIES:  Allergies  Allergen Reactions   Fluzone Quadrivalent [Influenza Vac Split Quad] Shortness Of Breath    The patient stated, in 10/2020: "I am not taking any more flu shots. It liked to have killed me." Shot up blood pressure and caused dizziness     PERTINENT MEDICATIONS:  Outpatient Encounter Medications as of 11/01/2021  Medication Sig   pantoprazole (PROTONIX) 40 MG tablet TAKE 1 TABLET(40 MG) BY MOUTH TWICE DAILY   Accu-Chek Softclix Lancets lancets USE AS DIRECTED TO TEST BLOOD SUGAR FOUR TIMES DAILY   albuterol (VENTOLIN HFA) 108 (90 Base) MCG/ACT inhaler Inhale 2 puffs into the lungs every 6  (six) hours as needed for wheezing or shortness of breath.   ANORO ELLIPTA 62.5-25 MCG/ACT AEPB Inhale 1 puff into the lungs daily.   blood glucose meter kit and supplies Dispense based on patient and insurance preference. Use up to four times daily as directed. (FOR ICD-10 E10.9, E11.9).   ferrous sulfate 325 (65 FE) MG tablet Take 1 tablet (325 mg total) by mouth daily with breakfast. (Patient not taking: Reported on 10/14/2021)   furosemide (LASIX) 40 MG tablet Take 1 tablet (40 mg total) by mouth daily.   lactulose (CHRONULAC) 10 GM/15ML solution TAKE 45 MLS BY MOUTH THREE TIMES DAILY   lidocaine (LIDODERM) 5 % Place 1 patch onto the skin daily. Remove & Discard patch within 12 hours or as directed by MD   ondansetron (ZOFRAN) 4 MG tablet Take 1 tablet (4 mg total) by mouth every 8 (eight) hours as needed for nausea or vomiting.   rifaximin (XIFAXAN) 550 MG TABS tablet Take 1 tablet (550 mg total) by mouth 2 (two) times daily.   spironolactone (ALDACTONE) 50 MG tablet Take 1 tablet (50 mg total) by mouth daily.   No facility-administered encounter medications on file as of 11/01/2021.   Thank you for the opportunity to participate in the care of Mr. Strahan.  The palliative care team will continue to follow. Please call our office at [phone] if we can be of additional assistance.   Hollace Kinnier, DO   COVID-19 PATIENT SCREENING TOOL Asked and negative response unless otherwise noted:  Have you had symptoms of covid, tested positive or been in contact with someone with symptoms/positive test in the past 5-10 days?

## 2021-11-02 NOTE — Progress Notes (Signed)
No response to phone calls at any numbers listed.  Voicemail and text sent to son without response.  Admin staff notified who also called and left messages. ?

## 2021-11-03 DIAGNOSIS — I5032 Chronic diastolic (congestive) heart failure: Secondary | ICD-10-CM | POA: Diagnosis not present

## 2021-11-03 DIAGNOSIS — R188 Other ascites: Secondary | ICD-10-CM | POA: Diagnosis not present

## 2021-11-03 DIAGNOSIS — G928 Other toxic encephalopathy: Secondary | ICD-10-CM | POA: Diagnosis not present

## 2021-11-03 DIAGNOSIS — K219 Gastro-esophageal reflux disease without esophagitis: Secondary | ICD-10-CM | POA: Diagnosis not present

## 2021-11-03 DIAGNOSIS — E119 Type 2 diabetes mellitus without complications: Secondary | ICD-10-CM | POA: Diagnosis not present

## 2021-11-03 DIAGNOSIS — R69 Illness, unspecified: Secondary | ICD-10-CM | POA: Diagnosis not present

## 2021-11-03 DIAGNOSIS — D509 Iron deficiency anemia, unspecified: Secondary | ICD-10-CM | POA: Diagnosis not present

## 2021-11-03 DIAGNOSIS — K745 Biliary cirrhosis, unspecified: Secondary | ICD-10-CM | POA: Diagnosis not present

## 2021-11-03 DIAGNOSIS — I700 Atherosclerosis of aorta: Secondary | ICD-10-CM | POA: Diagnosis not present

## 2021-11-03 DIAGNOSIS — G4733 Obstructive sleep apnea (adult) (pediatric): Secondary | ICD-10-CM | POA: Diagnosis not present

## 2021-11-03 DIAGNOSIS — M545 Low back pain, unspecified: Secondary | ICD-10-CM | POA: Diagnosis not present

## 2021-11-03 DIAGNOSIS — I110 Hypertensive heart disease with heart failure: Secondary | ICD-10-CM | POA: Diagnosis not present

## 2021-11-03 DIAGNOSIS — Z951 Presence of aortocoronary bypass graft: Secondary | ICD-10-CM | POA: Diagnosis not present

## 2021-11-03 DIAGNOSIS — M1000 Idiopathic gout, unspecified site: Secondary | ICD-10-CM | POA: Diagnosis not present

## 2021-11-03 DIAGNOSIS — D61811 Other drug-induced pancytopenia: Secondary | ICD-10-CM | POA: Diagnosis not present

## 2021-11-03 DIAGNOSIS — E785 Hyperlipidemia, unspecified: Secondary | ICD-10-CM | POA: Diagnosis not present

## 2021-11-03 DIAGNOSIS — K279 Peptic ulcer, site unspecified, unspecified as acute or chronic, without hemorrhage or perforation: Secondary | ICD-10-CM | POA: Diagnosis not present

## 2021-11-03 DIAGNOSIS — M1990 Unspecified osteoarthritis, unspecified site: Secondary | ICD-10-CM | POA: Diagnosis not present

## 2021-11-03 DIAGNOSIS — N471 Phimosis: Secondary | ICD-10-CM | POA: Diagnosis not present

## 2021-11-03 DIAGNOSIS — T40711D Poisoning by cannabis, accidental (unintentional), subsequent encounter: Secondary | ICD-10-CM | POA: Diagnosis not present

## 2021-11-03 DIAGNOSIS — Z8505 Personal history of malignant neoplasm of liver: Secondary | ICD-10-CM | POA: Diagnosis not present

## 2021-11-03 DIAGNOSIS — Z7951 Long term (current) use of inhaled steroids: Secondary | ICD-10-CM | POA: Diagnosis not present

## 2021-11-03 DIAGNOSIS — I864 Gastric varices: Secondary | ICD-10-CM | POA: Diagnosis not present

## 2021-11-03 DIAGNOSIS — K766 Portal hypertension: Secondary | ICD-10-CM | POA: Diagnosis not present

## 2021-11-03 DIAGNOSIS — I2510 Atherosclerotic heart disease of native coronary artery without angina pectoris: Secondary | ICD-10-CM | POA: Diagnosis not present

## 2021-11-10 DIAGNOSIS — R188 Other ascites: Secondary | ICD-10-CM | POA: Diagnosis not present

## 2021-11-10 DIAGNOSIS — K745 Biliary cirrhosis, unspecified: Secondary | ICD-10-CM | POA: Diagnosis not present

## 2021-11-10 DIAGNOSIS — M1000 Idiopathic gout, unspecified site: Secondary | ICD-10-CM | POA: Diagnosis not present

## 2021-11-10 DIAGNOSIS — I5032 Chronic diastolic (congestive) heart failure: Secondary | ICD-10-CM | POA: Diagnosis not present

## 2021-11-10 DIAGNOSIS — I864 Gastric varices: Secondary | ICD-10-CM | POA: Diagnosis not present

## 2021-11-10 DIAGNOSIS — T40711D Poisoning by cannabis, accidental (unintentional), subsequent encounter: Secondary | ICD-10-CM | POA: Diagnosis not present

## 2021-11-10 DIAGNOSIS — K219 Gastro-esophageal reflux disease without esophagitis: Secondary | ICD-10-CM | POA: Diagnosis not present

## 2021-11-10 DIAGNOSIS — M545 Low back pain, unspecified: Secondary | ICD-10-CM | POA: Diagnosis not present

## 2021-11-10 DIAGNOSIS — D509 Iron deficiency anemia, unspecified: Secondary | ICD-10-CM | POA: Diagnosis not present

## 2021-11-10 DIAGNOSIS — Z7951 Long term (current) use of inhaled steroids: Secondary | ICD-10-CM | POA: Diagnosis not present

## 2021-11-10 DIAGNOSIS — D61811 Other drug-induced pancytopenia: Secondary | ICD-10-CM | POA: Diagnosis not present

## 2021-11-10 DIAGNOSIS — I2510 Atherosclerotic heart disease of native coronary artery without angina pectoris: Secondary | ICD-10-CM | POA: Diagnosis not present

## 2021-11-10 DIAGNOSIS — R69 Illness, unspecified: Secondary | ICD-10-CM | POA: Diagnosis not present

## 2021-11-10 DIAGNOSIS — N471 Phimosis: Secondary | ICD-10-CM | POA: Diagnosis not present

## 2021-11-10 DIAGNOSIS — G4733 Obstructive sleep apnea (adult) (pediatric): Secondary | ICD-10-CM | POA: Diagnosis not present

## 2021-11-10 DIAGNOSIS — M1990 Unspecified osteoarthritis, unspecified site: Secondary | ICD-10-CM | POA: Diagnosis not present

## 2021-11-10 DIAGNOSIS — K279 Peptic ulcer, site unspecified, unspecified as acute or chronic, without hemorrhage or perforation: Secondary | ICD-10-CM | POA: Diagnosis not present

## 2021-11-10 DIAGNOSIS — I110 Hypertensive heart disease with heart failure: Secondary | ICD-10-CM | POA: Diagnosis not present

## 2021-11-10 DIAGNOSIS — I700 Atherosclerosis of aorta: Secondary | ICD-10-CM | POA: Diagnosis not present

## 2021-11-10 DIAGNOSIS — Z951 Presence of aortocoronary bypass graft: Secondary | ICD-10-CM | POA: Diagnosis not present

## 2021-11-10 DIAGNOSIS — Z8505 Personal history of malignant neoplasm of liver: Secondary | ICD-10-CM | POA: Diagnosis not present

## 2021-11-10 DIAGNOSIS — G928 Other toxic encephalopathy: Secondary | ICD-10-CM | POA: Diagnosis not present

## 2021-11-10 DIAGNOSIS — K766 Portal hypertension: Secondary | ICD-10-CM | POA: Diagnosis not present

## 2021-11-10 DIAGNOSIS — E119 Type 2 diabetes mellitus without complications: Secondary | ICD-10-CM | POA: Diagnosis not present

## 2021-11-10 DIAGNOSIS — E785 Hyperlipidemia, unspecified: Secondary | ICD-10-CM | POA: Diagnosis not present

## 2021-11-11 ENCOUNTER — Ambulatory Visit: Payer: Medicare HMO | Admitting: Hematology and Oncology

## 2021-11-12 ENCOUNTER — Telehealth: Payer: Self-pay

## 2021-11-12 NOTE — Telephone Encounter (Signed)
Spoke with Sherrie to let her know that Dr. Sharlet Salina does agree with the hospice referral and will be the attending physician. No other questions or concerns.  ?

## 2021-11-12 NOTE — Telephone Encounter (Signed)
Yes to both I agree with hospital and can be attending of record.  ?

## 2021-11-12 NOTE — Telephone Encounter (Signed)
Sherrie calling from [employer] after family contacted them to Hospice Evaluation   ? ?Sherrie is requesting if Dr. Sharlet Salina agrees with the referral? ? ?If so, is Dr. Sharlet Salina willing to be the attending of record? ? ?Please advise [phone] ?

## 2021-11-15 ENCOUNTER — Emergency Department (HOSPITAL_COMMUNITY): Payer: Medicare HMO

## 2021-11-15 ENCOUNTER — Telehealth: Payer: Self-pay | Admitting: Internal Medicine

## 2021-11-15 ENCOUNTER — Emergency Department (HOSPITAL_COMMUNITY)
Admission: EM | Admit: 2021-11-15 | Discharge: 2021-11-15 | Disposition: A | Discharge status: Home / Self Care | Payer: Medicare HMO | Source: Home / Self Care | Attending: Emergency Medicine | Admitting: Emergency Medicine

## 2021-11-15 ENCOUNTER — Encounter (HOSPITAL_COMMUNITY): Payer: Self-pay | Admitting: Emergency Medicine

## 2021-11-15 DIAGNOSIS — C7800 Secondary malignant neoplasm of unspecified lung: Secondary | ICD-10-CM | POA: Insufficient documentation

## 2021-11-15 DIAGNOSIS — Z8505 Personal history of malignant neoplasm of liver: Secondary | ICD-10-CM | POA: Insufficient documentation

## 2021-11-15 DIAGNOSIS — R7401 Elevation of levels of liver transaminase levels: Secondary | ICD-10-CM | POA: Diagnosis not present

## 2021-11-15 DIAGNOSIS — R55 Syncope and collapse: Secondary | ICD-10-CM | POA: Diagnosis not present

## 2021-11-15 DIAGNOSIS — N179 Acute kidney failure, unspecified: Secondary | ICD-10-CM | POA: Insufficient documentation

## 2021-11-15 DIAGNOSIS — I2510 Atherosclerotic heart disease of native coronary artery without angina pectoris: Secondary | ICD-10-CM | POA: Insufficient documentation

## 2021-11-15 DIAGNOSIS — R000 Tachycardia, unspecified: Secondary | ICD-10-CM | POA: Insufficient documentation

## 2021-11-15 DIAGNOSIS — Z7401 Bed confinement status: Secondary | ICD-10-CM | POA: Diagnosis not present

## 2021-11-15 DIAGNOSIS — S299XXA Unspecified injury of thorax, initial encounter: Secondary | ICD-10-CM | POA: Diagnosis not present

## 2021-11-15 DIAGNOSIS — I509 Heart failure, unspecified: Secondary | ICD-10-CM | POA: Insufficient documentation

## 2021-11-15 DIAGNOSIS — C3490 Malignant neoplasm of unspecified part of unspecified bronchus or lung: Secondary | ICD-10-CM

## 2021-11-15 DIAGNOSIS — A419 Sepsis, unspecified organism: Secondary | ICD-10-CM | POA: Diagnosis not present

## 2021-11-15 DIAGNOSIS — J45909 Unspecified asthma, uncomplicated: Secondary | ICD-10-CM | POA: Insufficient documentation

## 2021-11-15 DIAGNOSIS — E119 Type 2 diabetes mellitus without complications: Secondary | ICD-10-CM | POA: Insufficient documentation

## 2021-11-15 DIAGNOSIS — Z743 Need for continuous supervision: Secondary | ICD-10-CM | POA: Diagnosis not present

## 2021-11-15 DIAGNOSIS — C7989 Secondary malignant neoplasm of other specified sites: Secondary | ICD-10-CM | POA: Insufficient documentation

## 2021-11-15 DIAGNOSIS — R4182 Altered mental status, unspecified: Secondary | ICD-10-CM | POA: Diagnosis not present

## 2021-11-15 DIAGNOSIS — R9431 Abnormal electrocardiogram [ECG] [EKG]: Secondary | ICD-10-CM | POA: Diagnosis not present

## 2021-11-15 DIAGNOSIS — I959 Hypotension, unspecified: Secondary | ICD-10-CM | POA: Diagnosis not present

## 2021-11-15 DIAGNOSIS — R748 Abnormal levels of other serum enzymes: Secondary | ICD-10-CM | POA: Insufficient documentation

## 2021-11-15 DIAGNOSIS — Z515 Encounter for palliative care: Secondary | ICD-10-CM | POA: Diagnosis not present

## 2021-11-15 DIAGNOSIS — I4891 Unspecified atrial fibrillation: Secondary | ICD-10-CM | POA: Diagnosis not present

## 2021-11-15 DIAGNOSIS — S3991XA Unspecified injury of abdomen, initial encounter: Secondary | ICD-10-CM | POA: Diagnosis not present

## 2021-11-15 LAB — CBC WITH DIFFERENTIAL/PLATELET
Abs Immature Granulocytes: 0.03 10*3/uL (ref 0.00–0.07)
Basophils Absolute: 0 10*3/uL (ref 0.0–0.1)
Basophils Relative: 0 %
Eosinophils Absolute: 0 10*3/uL (ref 0.0–0.5)
Eosinophils Relative: 1 %
HCT: 32.8 % — ABNORMAL LOW (ref 39.0–52.0)
Hemoglobin: 9.6 g/dL — ABNORMAL LOW (ref 13.0–17.0)
Immature Granulocytes: 0 %
Lymphocytes Relative: 5 %
Lymphs Abs: 0.4 10*3/uL — ABNORMAL LOW (ref 0.7–4.0)
MCH: 24.5 pg — ABNORMAL LOW (ref 26.0–34.0)
MCHC: 29.3 g/dL — ABNORMAL LOW (ref 30.0–36.0)
MCV: 83.7 fL (ref 80.0–100.0)
Monocytes Absolute: 0.4 10*3/uL (ref 0.1–1.0)
Monocytes Relative: 5 %
Neutro Abs: 6.4 10*3/uL (ref 1.7–7.7)
Neutrophils Relative %: 89 %
Platelets: 139 10*3/uL — ABNORMAL LOW (ref 150–400)
RBC: 3.92 MIL/uL — ABNORMAL LOW (ref 4.22–5.81)
RDW: 25.1 % — ABNORMAL HIGH (ref 11.5–15.5)
WBC: 7.2 10*3/uL (ref 4.0–10.5)
nRBC: 0 % (ref 0.0–0.2)

## 2021-11-15 LAB — COMPREHENSIVE METABOLIC PANEL
ALT: 68 U/L — ABNORMAL HIGH (ref 0–44)
AST: 102 U/L — ABNORMAL HIGH (ref 15–41)
Albumin: 2 g/dL — ABNORMAL LOW (ref 3.5–5.0)
Alkaline Phosphatase: 1609 U/L — ABNORMAL HIGH (ref 38–126)
Anion gap: 14 (ref 5–15)
BUN: 46 mg/dL — ABNORMAL HIGH (ref 8–23)
CO2: 20 mmol/L — ABNORMAL LOW (ref 22–32)
Calcium: 9 mg/dL (ref 8.9–10.3)
Chloride: 100 mmol/L (ref 98–111)
Creatinine, Ser: 2.03 mg/dL — ABNORMAL HIGH (ref 0.61–1.24)
GFR, Estimated: 34 mL/min — ABNORMAL LOW (ref >60)
Glucose, Bld: 117 mg/dL — ABNORMAL HIGH (ref 70–99)
Potassium: 4.2 mmol/L (ref 3.5–5.1)
Sodium: 134 mmol/L — ABNORMAL LOW (ref 135–145)
Total Bilirubin: 4.9 mg/dL — ABNORMAL HIGH (ref 0.3–1.2)
Total Protein: 6.4 g/dL — ABNORMAL LOW (ref 6.5–8.1)

## 2021-11-15 LAB — I-STAT CHEM 8, ED
BUN: 38 mg/dL — ABNORMAL HIGH (ref 8–23)
Calcium, Ion: 1.17 mmol/L (ref 1.15–1.40)
Chloride: 101 mmol/L (ref 98–111)
Creatinine, Ser: 2.2 mg/dL — ABNORMAL HIGH (ref 0.61–1.24)
Glucose, Bld: 113 mg/dL — ABNORMAL HIGH (ref 70–99)
HCT: 34 % — ABNORMAL LOW (ref 39.0–52.0)
Hemoglobin: 11.6 g/dL — ABNORMAL LOW (ref 13.0–17.0)
Potassium: 4.3 mmol/L (ref 3.5–5.1)
Sodium: 137 mmol/L (ref 135–145)
TCO2: 21 mmol/L — ABNORMAL LOW (ref 22–32)

## 2021-11-15 LAB — MAGNESIUM: Magnesium: 2.3 mg/dL (ref 1.7–2.4)

## 2021-11-15 LAB — LIPASE, BLOOD: Lipase: 33 U/L (ref 11–51)

## 2021-11-15 MED ORDER — SODIUM CHLORIDE 0.9 % IV BOLUS
1000.0000 mL | Freq: Once | INTRAVENOUS | Status: AC
  Administered 2021-11-15: 1000 mL via INTRAVENOUS

## 2021-11-15 MED ORDER — OXYCODONE HCL 5 MG PO TABS: 5.0000 mg | ORAL_TABLET | Freq: Four times a day (QID) | ORAL | 0 refills | Status: AC | PRN

## 2021-11-15 MED ORDER — LACTATED RINGERS IV BOLUS
1000.0000 mL | Freq: Once | INTRAVENOUS | Status: AC
  Administered 2021-11-15: 1000 mL via INTRAVENOUS

## 2021-11-15 MED ORDER — MORPHINE SULFATE (PF) 4 MG/ML IV SOLN
4.0000 mg | Freq: Once | INTRAVENOUS | Status: AC
Start: 2021-11-15 — End: 2021-11-15
  Administered 2021-11-15: 4 mg via INTRAVENOUS
  Filled 2021-11-15: qty 1

## 2021-11-15 NOTE — Discharge Instructions (Signed)
Your cancer have progressed since your last scan. ? ?I have prescribed some oxycodone as needed for pain. ? ?You need to follow-up with your oncologist and primary care doctor. ? ?You need to have your kidney and liver function checked next week ? ?Please also call your palliative care team regarding goals of care and management of your pain ? ?Return to ER if you have worse abdominal pain, vomiting, trouble breathing, fever ?

## 2021-11-15 NOTE — Telephone Encounter (Signed)
Threasa Beards, admissions nurse w/ [facility], making provider aware pts son is taking pt to ed for pain after fall and congestion ?

## 2021-11-15 NOTE — ED Provider Notes (Signed)
?[facility] DEPT ?Provider Note ? ? ?CSN: [id number] ?Arrival date & time: 11/15/21  1449 ? ?  ? ?History ? ?Chief Complaint  ?Patient presents with  ? Fall  ? ? ?[name] is a 76 y.o. male. ? ?HPI ?Patient presents after a fall.  His medical history includes I4PP, alcoholic cirrhosis, hepatocellular carcinoma, gout, OSA, CAD, HLD, anemia, PUD, asthma, GERD, CHF, metastatic lung cancer.  ? ?History per son: Patient had a fall out of bed several weeks ago.  He was seen in the ED following that fall.  They did do CT imaging of his head and he was discharged home.  Approximately 1 to 2 days after that, he began complaining of right-sided abdominal pain.  He has had persistent pain in this area since that time.  This has caused increasing concern to family.  Patient has not had any further falls but does come to the ED for evaluation of this ongoing right-sided abdominal pain.  Patient has not complained of any other areas of discomfort.  He continues to eat very little but is able to tolerate p.o. intake.  He did have an episode of vomiting yesterday and again today.  This does happen from time to time.  Patient's son states that patient will officially be on hospice when he does return from the hospital.  Hospice team came out to the home today to initiate care. ?  ? ?Home Medications ?Prior to Admission medications   ?Medication Sig Start Date End Date Taking? Authorizing Provider  ?oxyCODONE (ROXICODONE) 5 MG immediate release tablet Take 1 tablet (5 mg total) by mouth every 6 (six) hours as needed for severe pain. 11/15/21  Yes Drenda Freeze, MD  ?pantoprazole (PROTONIX) 40 MG tablet TAKE 1 TABLET(40 MG) BY MOUTH TWICE DAILY 10/26/21   [name], MD  ?Accu-Chek Softclix Lancets lancets USE AS DIRECTED TO TEST BLOOD SUGAR FOUR TIMES DAILY 03/13/20   [name], MD  ?albuterol (VENTOLIN HFA) 108 (90 Base) MCG/ACT inhaler Inhale 2 puffs into the lungs every 6  (six) hours as needed for wheezing or shortness of breath. 03/01/21   Aline August, MD  ?Celedonio Miyamoto 62.5-25 MCG/ACT AEPB Inhale 1 puff into the lungs daily. 07/25/21   Mercy Riding, MD  ?blood glucose meter kit and supplies Dispense based on patient and insurance preference. Use up to four times daily as directed. (FOR ICD-10 E10.9, E11.9). 03/12/20   [name], MD  ?ferrous sulfate 325 (65 FE) MG tablet Take 1 tablet (325 mg total) by mouth daily with breakfast. ?Patient not taking: Reported on 10/14/2021 05/06/21 09/16/22  [name], MD  ?furosemide (LASIX) 40 MG tablet Take 1 tablet (40 mg total) by mouth daily. 07/25/21 10/23/21  Mercy Riding, MD  ?lactulose (CHRONULAC) 10 GM/15ML solution TAKE 45 MLS BY MOUTH THREE TIMES DAILY 10/26/21   [name], MD  ?lidocaine (LIDODERM) 5 % Place 1 patch onto the skin daily. Remove & Discard patch within 12 hours or as directed by MD 10/12/21   [name], MD  ?ondansetron (ZOFRAN) 4 MG tablet Take 1 tablet (4 mg total) by mouth every 8 (eight) hours as needed for nausea or vomiting. 10/28/21   Prosperi, Darrick Meigs H, PA-C  ?rifaximin (XIFAXAN) 550 MG TABS tablet Take 1 tablet (550 mg total) by mouth 2 (two) times daily. 09/12/21   Aline August, MD  ?spironolactone (ALDACTONE) 50 MG tablet Take 1 tablet (50 mg total) by  mouth daily. 07/25/21   Mercy Riding, MD  ?   ? ?Allergies    ?Fluzone quadrivalent [influenza vac split quad]   ? ?Review of Systems   ?Review of Systems  ?Gastrointestinal:  Positive for abdominal pain.  ?All other systems reviewed and are negative. ? ?Physical Exam ?Updated Vital Signs ?BP 106/64   Pulse (!) 104   Temp 97.6 ?F (36.4 ?C) (Axillary)   Resp (!) 24   SpO2 98%  ?Physical Exam ?Vitals and nursing note reviewed.  ?Constitutional:   ?   General: He is not in acute distress. ?   Appearance: He is well-developed. He is ill-appearing (Chronically). He is not toxic-appearing or diaphoretic.  ?HENT:  ?   Head:  Normocephalic and atraumatic.  ?   Right Ear: External ear normal.  ?   Left Ear: External ear normal.  ?   Nose: Nose normal.  ?   Mouth/Throat:  ?   Mouth: Mucous membranes are dry.  ?   Pharynx: Oropharynx is clear.  ?Eyes:  ?   Extraocular Movements: Extraocular movements intact.  ?   Conjunctiva/sclera: Conjunctivae normal.  ?Cardiovascular:  ?   Rate and Rhythm: Regular rhythm. Tachycardia present.  ?   Heart sounds: No murmur heard. ?Pulmonary:  ?   Effort: Pulmonary effort is normal. No respiratory distress.  ?   Breath sounds: Normal breath sounds. No wheezing or rales.  ?Chest:  ?   Chest wall: No tenderness.  ?Abdominal:  ?   Palpations: Abdomen is soft.  ?   Tenderness: There is abdominal tenderness. There is no guarding or rebound.  ?Musculoskeletal:     ?   General: No swelling or deformity.  ?   Cervical back: Neck supple. No rigidity.  ?Skin: ?   General: Skin is warm and dry.  ?Neurological:  ?   General: No focal deficit present.  ?   Mental Status: He is alert.  ?Psychiatric:     ?   Mood and Affect: Mood normal.  ? ? ?ED Results / Procedures / Treatments   ?Labs ?(all labs ordered are listed, but only abnormal results are displayed) ?Labs Reviewed  ?COMPREHENSIVE METABOLIC PANEL - Abnormal; Notable for the following components:  ?    Result Value  ? Sodium 134 (*)   ? CO2 20 (*)   ? Glucose, Bld 117 (*)   ? BUN 46 (*)   ? Creatinine, Ser 2.03 (*)   ? Total Protein 6.4 (*)   ? Albumin 2.0 (*)   ? AST 102 (*)   ? ALT 68 (*)   ? Alkaline Phosphatase 1,609 (*)   ? Total Bilirubin 4.9 (*)   ? GFR, Estimated 34 (*)   ? All other components within normal limits  ?CBC WITH DIFFERENTIAL/PLATELET - Abnormal; Notable for the following components:  ? RBC 3.92 (*)   ? Hemoglobin 9.6 (*)   ? HCT 32.8 (*)   ? MCH 24.5 (*)   ? MCHC 29.3 (*)   ? RDW 25.1 (*)   ? Platelets 139 (*)   ? Lymphs Abs 0.4 (*)   ? All other components within normal limits  ?I-STAT CHEM 8, ED - Abnormal; Notable for the following  components:  ? BUN 38 (*)   ? Creatinine, Ser 2.20 (*)   ? Glucose, Bld 113 (*)   ? TCO2 21 (*)   ? Hemoglobin 11.6 (*)   ? HCT 34.0 (*)   ? All other components  within normal limits  ?LIPASE, BLOOD  ?MAGNESIUM  ? ? ?EKG ?EKG Interpretation ? ?Date/Time:  Monday November 15 2021 16:38:02 EDT ?Ventricular Rate:  107 ?PR Interval:    ?QRS Duration: 100 ?QT Interval:  373 ?QTC Calculation: 498 ?R Axis:   84 ?Text Interpretation: Atrial fibrillation Borderline right axis deviation Low voltage, extremity leads Nonspecific T abnormalities, lateral leads Borderline prolonged QT interval Confirmed by Wandra Arthurs [phone]) on 11/15/2021 5:13:50 PM ? ?Radiology ?CT CHEST ABDOMEN PELVIS WO CONTRAST ? ?Result Date: 11/15/2021 ?CLINICAL DATA:  Polytrauma, blunt fall, AKI, no oral or IV contrast. Fall 4 days ago. Ribcage pain and increased weakness. Marland Kitchen History of cirrhosis and hepatocellular carcinoma. EXAM: CT CHEST, ABDOMEN AND PELVIS WITHOUT CONTRAST TECHNIQUE: Multidetector CT imaging of the chest, abdomen and pelvis was performed following the standard protocol without IV contrast. RADIATION DOSE REDUCTION: This exam was performed according to the departmental dose-optimization program which includes automated exposure control, adjustment of the mA and/or kV according to patient size and/or use of iterative reconstruction technique. COMPARISON:  PET-CT 10/14/2021 FINDINGS: CHEST: Ports and Devices: None. Lungs/airways: Patchy ground-glass and consolidative airspace opacity within the aerated portions of the right upper lobe. The right middle lobe and right lower lobe are almost completely collapsed. Debris versus mass lesions noted within the right mainstem bronchi and minimally aerated bronchials. Interval increase in size of a right lower lobe subpleural mass measuring 5.4 x 8 cm (from 4.9 x 4.3 cm) (2: 45). The left lung is clear. No pulmonary contusion or laceration. No pneumatocele formation. Pleura: Interval increase of a now  large volume right pleural effusion. No left pleural effusion. No left pleural effusion. No pneumothorax. No hemothorax. Lymph Nodes: Mediastinal and bilateral hilar calcified lymph nodes. Interval increase in siz

## 2021-11-15 NOTE — ED Triage Notes (Signed)
Patient BIBA from home d/t fall 4 days ago. Reporting rib cage pain and increased weakness. No LOC during fall, did not hit head. No hx of blood thinners. 12-lead unremarkable. Pt Aox4. Hx stage 4 liver and lung cancer. On oxygen PRN at home.  ? ?BP 116/62 ?P 108 ?RR 22 ?SpO2 98% MRA ?CBG 151 ?

## 2021-11-15 NOTE — ED Provider Notes (Signed)
?  Physical Exam  ?BP 123/63   Pulse (!) 106   Temp (!) 97.4 ?F (36.3 ?C)   Resp 16   SpO2 97%  ? ?Physical Exam ? ?Procedures  ?Procedures ? ?ED Course / MDM  ?  ?Medical Decision Making ?Patient care assumed at 4 PM.  Patient is here with right upper quadrant pain.  Patient has known metastatic lung cancer.  Patient also had a fall and family wanted to make sure he did not fracture anything.  Patient had a CT head neck when he fell and he did not have an additional fall.  He does have some right upper quadrant pain so CT chest abdomen pelvis is ordered ? ?6:25 PM ?Patient has acute renal failure with creatinine 2.2.  Patient also has elevated alk phos. I reviewed his CT scan that showed worsening disease and in particular, increase in size of the right lower lobe mass.  I talked to the son who is at bedside.  He states that patient is under palliative care already.  He just wants his dad to be comfortable.  I prescribed some oxycodone for pain.  I also messaged his oncologist, Dr. Chryl Heck to follow-up with the patient.  I am not sure if there is any other treatment options at this point other than pain control.  ? ?Problems Addressed: ?AKI (acute kidney injury) [facility]): acute illness or injury ?Elevated liver enzymes: acute illness or injury ?Primary malignant neoplasm of lung metastatic to other site, unspecified laterality [facility]): chronic illness or injury with exacerbation, progression, or side effects of treatment ? ?Amount and/or Complexity of Data Reviewed ?Independent Historian: parent ?External Data Reviewed: labs and notes. ?Labs: ordered. Decision-making details documented in ED Course. ?Radiology: ordered and independent interpretation performed. Decision-making details documented in ED Course. ? ?Risk ?Prescription drug management. ? ? ? ? ? ? ? ?  ?Drenda Freeze, MD ?11/15/21 1827 ? ?

## 2021-11-15 NOTE — Progress Notes (Addendum)
Manufacturing engineer [facility]) Hospital Liaison Note ? ?This is a pending hospice patient with [facility]. Hospital liaison team will follow through hospitalization for conintuation of hospice care at discharge.  ? ?Please call prior to discharge or with any questions or concerns. Thank you ? ?Roselee Nova, LCSW ?[facility] Liaison ?[phone] ?

## 2021-11-17 ENCOUNTER — Inpatient Hospital Stay (HOSPITAL_COMMUNITY)
Admission: EM | Admit: 2021-11-17 | Discharge: 2021-11-27 | DRG: 951 | Disposition: E | Payer: Medicare HMO | Attending: Pulmonary Disease | Admitting: Pulmonary Disease

## 2021-11-17 ENCOUNTER — Emergency Department (HOSPITAL_COMMUNITY): Payer: Medicare HMO

## 2021-11-17 DIAGNOSIS — E872 Acidosis, unspecified: Secondary | ICD-10-CM

## 2021-11-17 DIAGNOSIS — Z515 Encounter for palliative care: Secondary | ICD-10-CM | POA: Diagnosis not present

## 2021-11-17 DIAGNOSIS — Z800 Family history of malignant neoplasm of digestive organs: Secondary | ICD-10-CM

## 2021-11-17 DIAGNOSIS — Z801 Family history of malignant neoplasm of trachea, bronchus and lung: Secondary | ICD-10-CM

## 2021-11-17 DIAGNOSIS — N170 Acute kidney failure with tubular necrosis: Secondary | ICD-10-CM | POA: Diagnosis not present

## 2021-11-17 DIAGNOSIS — K7460 Unspecified cirrhosis of liver: Secondary | ICD-10-CM | POA: Diagnosis present

## 2021-11-17 DIAGNOSIS — C220 Liver cell carcinoma: Secondary | ICD-10-CM | POA: Diagnosis present

## 2021-11-17 DIAGNOSIS — R918 Other nonspecific abnormal finding of lung field: Secondary | ICD-10-CM | POA: Diagnosis not present

## 2021-11-17 DIAGNOSIS — K7200 Acute and subacute hepatic failure without coma: Secondary | ICD-10-CM | POA: Diagnosis not present

## 2021-11-17 DIAGNOSIS — D649 Anemia, unspecified: Secondary | ICD-10-CM | POA: Diagnosis present

## 2021-11-17 DIAGNOSIS — A419 Sepsis, unspecified organism: Secondary | ICD-10-CM | POA: Diagnosis not present

## 2021-11-17 DIAGNOSIS — I110 Hypertensive heart disease with heart failure: Secondary | ICD-10-CM | POA: Diagnosis present

## 2021-11-17 DIAGNOSIS — J910 Malignant pleural effusion: Secondary | ICD-10-CM | POA: Diagnosis present

## 2021-11-17 DIAGNOSIS — Z66 Do not resuscitate: Secondary | ICD-10-CM | POA: Diagnosis not present

## 2021-11-17 DIAGNOSIS — M479 Spondylosis, unspecified: Secondary | ICD-10-CM | POA: Diagnosis present

## 2021-11-17 DIAGNOSIS — R778 Other specified abnormalities of plasma proteins: Secondary | ICD-10-CM | POA: Diagnosis present

## 2021-11-17 DIAGNOSIS — G8929 Other chronic pain: Secondary | ICD-10-CM | POA: Diagnosis present

## 2021-11-17 DIAGNOSIS — Z833 Family history of diabetes mellitus: Secondary | ICD-10-CM

## 2021-11-17 DIAGNOSIS — E785 Hyperlipidemia, unspecified: Secondary | ICD-10-CM | POA: Diagnosis present

## 2021-11-17 DIAGNOSIS — K219 Gastro-esophageal reflux disease without esophagitis: Secondary | ICD-10-CM | POA: Diagnosis present

## 2021-11-17 DIAGNOSIS — I5032 Chronic diastolic (congestive) heart failure: Secondary | ICD-10-CM | POA: Diagnosis present

## 2021-11-17 DIAGNOSIS — Z87891 Personal history of nicotine dependence: Secondary | ICD-10-CM

## 2021-11-17 DIAGNOSIS — R402431 Glasgow coma scale score 3-8, in the field [EMT or ambulance]: Secondary | ICD-10-CM

## 2021-11-17 DIAGNOSIS — R6521 Severe sepsis with septic shock: Secondary | ICD-10-CM | POA: Diagnosis present

## 2021-11-17 DIAGNOSIS — Z743 Need for continuous supervision: Secondary | ICD-10-CM | POA: Diagnosis not present

## 2021-11-17 DIAGNOSIS — Z20822 Contact with and (suspected) exposure to covid-19: Secondary | ICD-10-CM | POA: Diagnosis present

## 2021-11-17 DIAGNOSIS — C7800 Secondary malignant neoplasm of unspecified lung: Secondary | ICD-10-CM | POA: Diagnosis present

## 2021-11-17 DIAGNOSIS — Z79899 Other long term (current) drug therapy: Secondary | ICD-10-CM

## 2021-11-17 DIAGNOSIS — R4020 Unspecified coma: Secondary | ICD-10-CM | POA: Diagnosis not present

## 2021-11-17 DIAGNOSIS — G9341 Metabolic encephalopathy: Secondary | ICD-10-CM | POA: Diagnosis present

## 2021-11-17 DIAGNOSIS — Z8249 Family history of ischemic heart disease and other diseases of the circulatory system: Secondary | ICD-10-CM | POA: Diagnosis not present

## 2021-11-17 DIAGNOSIS — J984 Other disorders of lung: Secondary | ICD-10-CM | POA: Diagnosis not present

## 2021-11-17 DIAGNOSIS — D696 Thrombocytopenia, unspecified: Secondary | ICD-10-CM | POA: Diagnosis present

## 2021-11-17 DIAGNOSIS — I2510 Atherosclerotic heart disease of native coronary artery without angina pectoris: Secondary | ICD-10-CM | POA: Diagnosis present

## 2021-11-17 DIAGNOSIS — E119 Type 2 diabetes mellitus without complications: Secondary | ICD-10-CM | POA: Diagnosis present

## 2021-11-17 DIAGNOSIS — J90 Pleural effusion, not elsewhere classified: Secondary | ICD-10-CM | POA: Diagnosis not present

## 2021-11-17 DIAGNOSIS — J9601 Acute respiratory failure with hypoxia: Secondary | ICD-10-CM

## 2021-11-17 DIAGNOSIS — Z887 Allergy status to serum and vaccine status: Secondary | ICD-10-CM

## 2021-11-17 DIAGNOSIS — R4182 Altered mental status, unspecified: Secondary | ICD-10-CM | POA: Diagnosis not present

## 2021-11-17 DIAGNOSIS — T68XXXA Hypothermia, initial encounter: Secondary | ICD-10-CM

## 2021-11-17 DIAGNOSIS — I959 Hypotension, unspecified: Secondary | ICD-10-CM

## 2021-11-17 DIAGNOSIS — R579 Shock, unspecified: Principal | ICD-10-CM

## 2021-11-17 DIAGNOSIS — Z7951 Long term (current) use of inhaled steroids: Secondary | ICD-10-CM

## 2021-11-17 DIAGNOSIS — G4733 Obstructive sleep apnea (adult) (pediatric): Secondary | ICD-10-CM | POA: Diagnosis present

## 2021-11-17 DIAGNOSIS — I499 Cardiac arrhythmia, unspecified: Secondary | ICD-10-CM | POA: Diagnosis not present

## 2021-11-17 DIAGNOSIS — M545 Low back pain, unspecified: Secondary | ICD-10-CM | POA: Diagnosis present

## 2021-11-17 DIAGNOSIS — R404 Transient alteration of awareness: Secondary | ICD-10-CM | POA: Diagnosis not present

## 2021-11-17 LAB — URINALYSIS, ROUTINE W REFLEX MICROSCOPIC
Bilirubin Urine: NEGATIVE
Glucose, UA: NEGATIVE mg/dL
Hgb urine dipstick: NEGATIVE
Ketones, ur: NEGATIVE mg/dL
Nitrite: NEGATIVE
Protein, ur: NEGATIVE mg/dL
Specific Gravity, Urine: 1.014 (ref 1.005–1.030)
pH: 5 (ref 5.0–8.0)

## 2021-11-17 LAB — CBC WITH DIFFERENTIAL/PLATELET
Abs Immature Granulocytes: 0.05 10*3/uL (ref 0.00–0.07)
Basophils Absolute: 0 10*3/uL (ref 0.0–0.1)
Basophils Relative: 0 %
Eosinophils Absolute: 0 10*3/uL (ref 0.0–0.5)
Eosinophils Relative: 0 %
HCT: 31.4 % — ABNORMAL LOW (ref 39.0–52.0)
Hemoglobin: 8.6 g/dL — ABNORMAL LOW (ref 13.0–17.0)
Immature Granulocytes: 1 %
Lymphocytes Relative: 4 %
Lymphs Abs: 0.3 10*3/uL — ABNORMAL LOW (ref 0.7–4.0)
MCH: 24.7 pg — ABNORMAL LOW (ref 26.0–34.0)
MCHC: 27.4 g/dL — ABNORMAL LOW (ref 30.0–36.0)
MCV: 90.2 fL (ref 80.0–100.0)
Monocytes Absolute: 0.3 10*3/uL (ref 0.1–1.0)
Monocytes Relative: 4 %
Neutro Abs: 6.5 10*3/uL (ref 1.7–7.7)
Neutrophils Relative %: 91 %
Platelets: 106 10*3/uL — ABNORMAL LOW (ref 150–400)
RBC: 3.48 MIL/uL — ABNORMAL LOW (ref 4.22–5.81)
RDW: 25.6 % — ABNORMAL HIGH (ref 11.5–15.5)
WBC: 7.1 10*3/uL (ref 4.0–10.5)
nRBC: 0.6 % — ABNORMAL HIGH (ref 0.0–0.2)

## 2021-11-17 LAB — RESP PANEL BY RT-PCR (FLU A&B, COVID) ARPGX2
Influenza A by PCR: NEGATIVE
Influenza B by PCR: NEGATIVE
SARS Coronavirus 2 by RT PCR: NEGATIVE

## 2021-11-17 LAB — COMPREHENSIVE METABOLIC PANEL
ALT: 123 U/L — ABNORMAL HIGH (ref 0–44)
AST: 267 U/L — ABNORMAL HIGH (ref 15–41)
Albumin: 1.7 g/dL — ABNORMAL LOW (ref 3.5–5.0)
Alkaline Phosphatase: 1248 U/L — ABNORMAL HIGH (ref 38–126)
Anion gap: 22 — ABNORMAL HIGH (ref 5–15)
BUN: 56 mg/dL — ABNORMAL HIGH (ref 8–23)
CO2: 12 mmol/L — ABNORMAL LOW (ref 22–32)
Calcium: 9.4 mg/dL (ref 8.9–10.3)
Chloride: 105 mmol/L (ref 98–111)
Creatinine, Ser: 3.34 mg/dL — ABNORMAL HIGH (ref 0.61–1.24)
GFR, Estimated: 18 mL/min — ABNORMAL LOW (ref >60)
Glucose, Bld: 133 mg/dL — ABNORMAL HIGH (ref 70–99)
Potassium: 4.9 mmol/L (ref 3.5–5.1)
Sodium: 139 mmol/L (ref 135–145)
Total Bilirubin: 6.1 mg/dL — ABNORMAL HIGH (ref 0.3–1.2)
Total Protein: 6 g/dL — ABNORMAL LOW (ref 6.5–8.1)

## 2021-11-17 LAB — CBG MONITORING, ED: Glucose-Capillary: 118 mg/dL — ABNORMAL HIGH (ref 70–99)

## 2021-11-17 LAB — LACTIC ACID, PLASMA: Lactic Acid, Venous: >9 mmol/L (ref 0.5–1.9)

## 2021-11-17 LAB — TROPONIN I (HIGH SENSITIVITY): Troponin I (High Sensitivity): 37 ng/L — ABNORMAL HIGH (ref <18)

## 2021-11-17 MED ORDER — VANCOMYCIN HCL IN DEXTROSE 1-5 GM/200ML-% IV SOLN: 1000.0000 mg | Freq: Once | INTRAVENOUS | Status: DC

## 2021-11-17 MED ORDER — VANCOMYCIN VARIABLE DOSE PER UNSTABLE RENAL FUNCTION (PHARMACIST DOSING): Status: DC

## 2021-11-17 MED ORDER — VANCOMYCIN HCL 1500 MG/300ML IV SOLN
1500.0000 mg | Freq: Once | INTRAVENOUS | Status: AC
  Administered 2021-11-17: 1500 mg via INTRAVENOUS
  Filled 2021-11-17: qty 300

## 2021-11-17 MED ORDER — HALOPERIDOL LACTATE 5 MG/ML IJ SOLN: 0.5000 mg | INTRAMUSCULAR | Status: DC | PRN

## 2021-11-17 MED ORDER — LACTATED RINGERS IV BOLUS (SEPSIS)
250.0000 mL | Freq: Once | INTRAVENOUS | Status: AC
  Administered 2021-11-17: 250 mL via INTRAVENOUS

## 2021-11-17 MED ORDER — HALOPERIDOL LACTATE 2 MG/ML PO CONC
0.5000 mg | ORAL | Status: DC | PRN
  Filled 2021-11-17: qty 0.3

## 2021-11-17 MED ORDER — MORPHINE BOLUS VIA INFUSION
2.0000 mg | INTRAVENOUS | Status: DC | PRN
  Filled 2021-11-17: qty 2

## 2021-11-17 MED ORDER — GLYCOPYRROLATE 1 MG PO TABS
1.0000 mg | ORAL_TABLET | ORAL | Status: DC | PRN
  Filled 2021-11-17: qty 1

## 2021-11-17 MED ORDER — ACETAMINOPHEN 650 MG RE SUPP: 650.0000 mg | Freq: Four times a day (QID) | RECTAL | Status: DC | PRN

## 2021-11-17 MED ORDER — GLYCOPYRROLATE 0.2 MG/ML IJ SOLN
0.2000 mg | INTRAMUSCULAR | Status: DC | PRN
  Administered 2021-11-18: 0.2 mg via INTRAVENOUS
  Filled 2021-11-17 (×2): qty 1

## 2021-11-17 MED ORDER — ONDANSETRON HCL 4 MG/2ML IJ SOLN: 4.0000 mg | Freq: Four times a day (QID) | INTRAMUSCULAR | Status: DC | PRN

## 2021-11-17 MED ORDER — LACTATED RINGERS IV SOLN: INTRAVENOUS | Status: DC

## 2021-11-17 MED ORDER — SODIUM CHLORIDE 0.9 % IV SOLN: 2.0000 g | INTRAVENOUS | Status: DC

## 2021-11-17 MED ORDER — BIOTENE DRY MOUTH MT LIQD: 15.0000 mL | OROMUCOSAL | Status: DC | PRN

## 2021-11-17 MED ORDER — DIPHENHYDRAMINE HCL 50 MG/ML IJ SOLN: 12.5000 mg | INTRAMUSCULAR | Status: DC | PRN

## 2021-11-17 MED ORDER — HALOPERIDOL 0.5 MG PO TABS
0.5000 mg | ORAL_TABLET | ORAL | Status: DC | PRN
  Filled 2021-11-17: qty 1

## 2021-11-17 MED ORDER — LACTATED RINGERS IV BOLUS (SEPSIS)
1000.0000 mL | Freq: Once | INTRAVENOUS | Status: AC
  Administered 2021-11-17: 1000 mL via INTRAVENOUS

## 2021-11-17 MED ORDER — NOREPINEPHRINE 4 MG/250ML-% IV SOLN
0.0000 ug/min | INTRAVENOUS | Status: DC
  Administered 2021-11-17: 10 ug/min via INTRAVENOUS
  Filled 2021-11-17: qty 250

## 2021-11-17 MED ORDER — SODIUM CHLORIDE 0.9 % IV SOLN
2.0000 g | Freq: Once | INTRAVENOUS | Status: AC
  Administered 2021-11-17: 2 g via INTRAVENOUS
  Filled 2021-11-17: qty 2

## 2021-11-17 MED ORDER — METRONIDAZOLE 500 MG/100ML IV SOLN
500.0000 mg | Freq: Once | INTRAVENOUS | Status: AC
  Administered 2021-11-17: 500 mg via INTRAVENOUS
  Filled 2021-11-17: qty 100

## 2021-11-17 MED ORDER — ONDANSETRON 4 MG PO TBDP: 4.0000 mg | ORAL_TABLET | Freq: Four times a day (QID) | ORAL | Status: DC | PRN

## 2021-11-17 MED ORDER — POLYVINYL ALCOHOL 1.4 % OP SOLN
1.0000 [drp] | Freq: Four times a day (QID) | OPHTHALMIC | Status: DC | PRN
  Filled 2021-11-17: qty 15

## 2021-11-17 MED ORDER — GLYCOPYRROLATE 0.2 MG/ML IJ SOLN
0.2000 mg | INTRAMUSCULAR | Status: DC | PRN
  Filled 2021-11-17: qty 1

## 2021-11-17 MED ORDER — LORAZEPAM 2 MG/ML IJ SOLN: 1.0000 mg | INTRAMUSCULAR | Status: DC | PRN

## 2021-11-17 MED ORDER — ACETAMINOPHEN 325 MG PO TABS: 650.0000 mg | ORAL_TABLET | Freq: Four times a day (QID) | ORAL | Status: DC | PRN

## 2021-11-17 MED ORDER — MORPHINE 100MG IN NS 100ML (1MG/ML) PREMIX INFUSION
10.0000 mg/h | INTRAVENOUS | Status: DC
  Administered 2021-11-17 – 2021-11-18 (×2): 10 mg/h via INTRAVENOUS
  Filled 2021-11-17 (×2): qty 100

## 2021-11-17 NOTE — Progress Notes (Signed)
Manufacturing engineer [facility]) Hospital Liaison Note ?  ?This is a pending hospice patient with [facility]. Hospital liaison team will follow through hospitalization for conintuation of hospice care at discharge.  ?  ?Please call prior to discharge or with any questions or concerns. Thank you ?  ?Roselee Nova, LCSW ?[facility]iaison ?[phone] ?

## 2021-11-17 NOTE — Progress Notes (Signed)
Pharmacy Antibiotic Note ? ?MOE GRACA is a 76 y.o. male admitted on 10/30/2021 with sepsis. Pharmacy has been consulted for vancomycin and cefepime dosing. Pt is hypothermic and WBC is WNL. SCr is elevated well above baseline. Lactic acid is significantly elevated.  ? ?Plan: ?Vancomycin 1500mg  IV x 1 then trend Scr for ongoing doses ?Cefepime 2gm IV Q24H ?F/u renal fxn, C&S, clinical status and peak/trough at [facility] ? ?Height: 5\' 8"  (172.7 cm) ?Weight: 75.8 kg (167 lb 1.7 oz) ?IBW/kg (Calculated) : 68.4 ? ?No data recorded. ? ?Recent Labs  ?Lab 11/15/21 ?1635 11/15/21 ?1648  ?WBC 7.2  --   ?CREATININE 2.03* 2.20*  ?  ?Estimated Creatinine Clearance: 28.1 mL/min (A) (by C-G formula based on SCr of 2.2 mg/dL (H)).   ? ?Allergies  ?Allergen Reactions  ? Fluzone Quadrivalent [Influenza Vac Split Quad] Shortness Of Breath  ?  The patient stated, in 10/2020: "I am not taking any more flu shots. It liked to have killed me." Shot up blood pressure and caused dizziness  ? ? ?Antimicrobials this admission: ?Vanc 3/22>> ?Cefepime 3/22>> ?Flagyl x 1 3/22 ? ?Dose adjustments this admission: ?N/A ? ?Microbiology results: ?Pending ? ?Thank you for allowing pharmacy to be a part of this patient?s care. ? ?Estell Dillinger, Rande Lawman ?11/10/2021 12:24 PM ? ?

## 2021-11-17 NOTE — H&P (Signed)
? ?NAME:  [name], MRN:  [id number], DOB:  07/26/1946, LOS: 0 ?ADMISSION DATE:  11/26/2021, CONSULTATION DATE:  11/06/2021 ?REFERRING MD:  Dr. Doren Custard, CHIEF COMPLAINT:  Septic shock ? ?History of Present Illness:  ?76 year old male with past medical history of hepatic cirrhosis, hepatic cell carcinoma with pulmonary metastatic disease, CAD who was brought to the hospital due to acute encephalopathy.  He was found down on the floor by his granddaughter.  In the ED he was hypotensive and hypothermic, found to be in septic shock requiring Levophed started in the ED. ? ?Patient was seen by oncology with Dr. Chryl Heck who recommended hospice for his extensive metastatic disease.  Looks like he was established with [facility] hospice. ? ?I spoke with patient's wife, Ronon Ferger and daughter Vickii Chafe in the room.  They state the patient had expressed in the past that he would not wanted to be on a hospital bed or hooked up to any machines.  They state that patient just wanted comfort care and control his pain.  I explained to family that patient is very sick with multisystem failure.  I explained that nothing we can do to have any meaningful recovery for the patient.  I proposed to go with comfort care where we focus more on symptomatic control and preserve patient's dignity at end-of-life.  Bolivia and Peggy verbalized understanding and would like to proceed with comfort care. ? ?Pertinent  Medical History  ? ?Past Medical History:  ?Diagnosis Date  ? Arthritis   ? "joints tighten up sometimes" (03/27/2104)  ? Carcinoma of liver, hepatocellular ([place]) 06/30/2020  ? Chronic lower back pain   ? Coronary artery disease involving native coronary artery of native heart without angina pectoris   ? Severe left main disease at catheterization July 2015  CABG x3 with a LIMA to the LAD, SVG to the OM, SVG to the PDA on 03/31/14. EF 60% by cath.   ? Diastolic heart failure ([place])   ? GERD without esophagitis 08/04/2010  ? Hepatic cirrhosis ([place])    ? a. Dx 01/2014 - CT a/p   ? History of blood transfusion   ? "related to bleeding ulcers"  ? History of concussion   ? 1976--  NO RESIDUAL  ? History of GI bleed   ? a. UGIB 07/2012;  b. 01/2014 admission with GIB/FOB stool req 1U prbc's->EGD showed portal gastropathy, barrett's esoph, and chronic active h. pylori gastritis.  ? History of gout   ? 2007 &  2008  LEFT LEG-- NO ISSUE SINCE  ? Hyperlipidemia   ? Iron deficiency anemia   ? Kidney stones   ? OA (osteoarthritis of spine)   ? LOWER BACK--  INTERMITTANT LEFT LEG NUMBNESS  ? OSA (obstructive sleep apnea)   ? PULMOLOGIST-  DR CLANCE--  MODERATE OSA  STARTED CPAP 2012--  BUT CURRENTLY HAS NOT USED PAST 6 MONTHS  ? Phimosis   ? a. s/p circumcision 2015.  ? Type 2 diabetes mellitus ([place])   ? Unspecified essential hypertension   ?  ? ?Significant Hospital Events: ?Including procedures, antibiotic start and stop dates in addition to other pertinent events   ? ? ?Interim History / Subjective:  ? ? ?Objective   ?Blood pressure (!) 114/59, pulse 91, temperature (!) 91.5 ?F (33.1 ?C), resp. rate 12, height 5\' 8"  (1.727 m), weight 75.8 kg, SpO2 92 %. ?   ?   ?No intake or output data in the 24 hours ending 11/21/2021 1536 ?Filed Weights  ?  11/14/2021 1200  ?Weight: 75.8 kg  ? ? ?Examination: ?General: Critically ill gentleman ?HENT: Dry mucous membrane ?Lungs: Tachypnea, shallow breathing ?Abdomen: Distended ?Extremities: Scattered hematoma seen on his extremities ?Neuro: Not alert or awake.  Not oriented. ? ? ?Resolved Hospital Problem list   ? ? ?Assessment & Plan:  ?Septic shock of unknown source ?Acute septic/metabolic encephalopathy ?Acute hypoxic respiratory failure ?Hypothermia ?Severe lactic acidosis ?Acute renal failure suspect ATN ?Elevated LFTs likely shock liver ?Thrombocytopenia ?Normocytic anemia ?History of hepatic cell carcinoma ?Pulmonary metastatic disease ?Cirrhosis ? ?Patient presents to the ED critically ill with septic shock and hypothermia of unknown  infection source.  He has severe lactic acidosis with AKI and elevated LFTs.  CT head did not reveal the cause of his encephalopathy, likely secondary to his sepsis.  Chest x-ray showed persistent right pleural malignant effusion.  He was started on pressor in the ED. ? ?I do not believe intensive care and aggressive interventions will have any meaningful recovery.  His oncologist has recommended hospice for his extensive metastatic disease.  After long discussion, family would like to proceed with comfort care.  We will stop Levophed and start morphine drip to avoid respiratory distress.  End-of-life order set placed. ? ?Best Practice (right click and "Reselect all SmartList Selections" daily)  ? ?Diet/type: NPO ?DVT prophylaxis: NA ?GI prophylaxis: NA ?Lines: NA ?Foley:  NA ?Code Status:  DNR ?Last date of multidisciplinary goals of care discussion [talked to wife and daughter 3/22] ? ?Labs   ?CBC: ?Recent Labs  ?Lab 11/15/21 ?1635 11/15/21 ?1648 11/14/2021 ?1222  ?WBC 7.2  --  7.1  ?NEUTROABS 6.4  --  6.5  ?HGB 9.6* 11.6* 8.6*  ?HCT 32.8* 34.0* 31.4*  ?MCV 83.7  --  90.2  ?PLT 139*  --  106*  ? ? ?Basic Metabolic Panel: ?Recent Labs  ?Lab 11/15/21 ?1635 11/15/21 ?1648 11/01/2021 ?1222  ?NA 134* 137 139  ?K 4.2 4.3 4.9  ?CL 100 101 105  ?CO2 20*  --  12*  ?GLUCOSE 117* 113* 133*  ?BUN 46* 38* 56*  ?CREATININE 2.03* 2.20* 3.34*  ?CALCIUM 9.0  --  9.4  ?MG 2.3  --   --   ? ?GFR: ?Estimated Creatinine Clearance: 18.5 mL/min (A) (by C-G formula based on SCr of 3.34 mg/dL (H)). ?Recent Labs  ?Lab 11/15/21 ?1635 11/05/2021 ?1222 11/11/2021 ?1226  ?WBC 7.2 7.1  --   ?LATICACIDVEN  --   --  >9.0*  ? ? ?Liver Function Tests: ?Recent Labs  ?Lab 11/15/21 ?1635 11/07/2021 ?1222  ?AST 102* 267*  ?ALT 68* 123*  ?ALKPHOS 1,609* 1,248*  ?BILITOT 4.9* 6.1*  ?PROT 6.4* 6.0*  ?ALBUMIN 2.0* 1.7*  ? ?Recent Labs  ?Lab 11/15/21 ?1635  ?LIPASE 33  ? ?No results for input(s): AMMONIA in the last 168 hours. ? ?ABG ?   ?Component Value Date/Time  ?  PHART 7.486 (H) 07/23/2021 1723  ? PCO2ART 33.9 07/23/2021 1723  ? PO2ART 78 (L) 07/23/2021 1723  ? HCO3 25.6 07/23/2021 1723  ? TCO2 21 (L) 11/15/2021 1648  ? ACIDBASEDEF 2.6 (H) 04/29/2021 0450  ? O2SAT 97.0 07/23/2021 1723  ?  ? ?Coagulation Profile: ?No results for input(s): INR, PROTIME in the last 168 hours. ? ?Cardiac Enzymes: ?No results for input(s): CKTOTAL, CKMB, CKMBINDEX, TROPONINI in the last 168 hours. ? ?HbA1C: ?Hgb A1c MFr Bld  ?Date/Time Value Ref Range Status  ?05/29/2021 05:44 AM 4.8 4.8 - 5.6 % Final  ?  Comment:  ?  (NOTE) ?  Pre diabetes:          5.7%-6.4% ? ?Diabetes:              >6.4% ? ?Glycemic control for   <7.0% ?adults with diabetes ?  ?03/28/2021 09:22 PM 5.0 4.8 - 5.6 % Final  ?  Comment:  ?  (NOTE) ?Pre diabetes:          5.7%-6.4% ? ?Diabetes:              >6.4% ? ?Glycemic control for   <7.0% ?adults with diabetes ?  ? ? ?CBG: ?Recent Labs  ?Lab 11/20/2021 ?1216  ?GLUCAP 118*  ? ? ?Review of Systems:   ?Unable to obtain due to acute encephalopathy ? ?Past Medical History:  ?He,  has a past medical history of Arthritis, Carcinoma of liver, hepatocellular ([place]) (06/30/2020), Chronic lower back pain, Coronary artery disease involving native coronary artery of native heart without angina pectoris, Diastolic heart failure ([place]), GERD without esophagitis (08/04/2010), Hepatic cirrhosis ([place]), History of blood transfusion, History of concussion, History of GI bleed, History of gout, Hyperlipidemia, Iron deficiency anemia, Kidney stones, OA (osteoarthritis of spine), OSA (obstructive sleep apnea), Phimosis, Type 2 diabetes mellitus ([place]), and Unspecified essential hypertension.  ? ?Surgical History:  ? ?Past Surgical History:  ?Procedure Laterality Date  ? ANKLE FRACTURE SURGERY Right 1989  ? "plate put in"  ? APPENDECTOMY  05-16-2004  ? open  ? BIOPSY  01/08/2021  ? Procedure: BIOPSY;  Surgeon: Wilford Corner, MD;  Location: [facility];  Service: Endoscopy;;  ? BIOPSY  05/02/2021  ?  Procedure: BIOPSY;  Surgeon: Arta Silence, MD;  Location: [facility];  Service: Endoscopy;;  ? CIRCUMCISION N/A 09/09/2013  ? Procedure: CIRCUMCISION ADULT;  Surgeon: Bernestine Amass, MD;  Location: [facility]

## 2021-11-17 NOTE — Sepsis Progress Note (Signed)
Code Sepsis protocol being monitored by eLink. 

## 2021-11-17 NOTE — ED Provider Notes (Signed)
?[facility] ?Provider Note ? ? ?CSN: [id number] ?Arrival date & time:    ? ?  ? ?History ? ?Chief Complaint  ?Patient presents with  ? Found on Floor  ? ? ?[name] is a 76 y.o. male. ? ?HPI ?Patient presents from home after being found on the floor unresponsive this morning.  Patient has known metastatic cancer and has been in very poor and declining health over the past several weeks.  Arrangements have been being made for home hospice.  It is unclear if patient has officially started hospice.  Patient was in his normal poor state of health last night.  This morning, he was found on the floor by his granddaughter.  EMS was called.  EMS noted hypothermia, hypoxia, and hypotension.  He was placed on a nonrebreather for oxygen support.  SPO2 was unable to be obtained on the patient's cold extremities.  Patient was maintaining airway but not responding to stimuli during transit.  Per patient's son, [name], patient has made it very clear that he would not want CPR if his heart were to stop and he would not want to be placed on a breathing machine. ?  ? ?Home Medications ?Prior to Admission medications   ?Medication Sig Start Date End Date Taking? Authorizing Provider  ?ANORO ELLIPTA 62.5-25 MCG/ACT AEPB Inhale 1 puff into the lungs daily. 07/25/21  Yes Mercy Riding, MD  ?lactulose (CHRONULAC) 10 GM/15ML solution TAKE 45 MLS BY MOUTH THREE TIMES DAILY ?Patient taking differently: Take 45 g by mouth 3 (three) times daily. 10/26/21  Yes [name], MD  ?lactulose, encephalopathy, (CHRONULAC) 10 GM/15ML SOLN Take 30 g by mouth 3 (three) times daily. 11/12/21  Yes [provider]  ?ondansetron (ZOFRAN) 4 MG tablet Take 1 tablet (4 mg total) by mouth every 8 (eight) hours as needed for nausea or vomiting. 10/28/21  Yes Prosperi, Christian H, PA-C  ?oxyCODONE (ROXICODONE) 5 MG immediate release tablet Take 1 tablet (5 mg total) by mouth every 6 (six) hours as  needed for severe pain. 11/15/21  Yes Drenda Freeze, MD  ?pantoprazole (PROTONIX) 40 MG tablet TAKE 1 TABLET(40 MG) BY MOUTH TWICE DAILY ?Patient taking differently: Take 40 mg by mouth 2 (two) times daily. 10/26/21  Yes [name], MD  ?rifaximin (XIFAXAN) 550 MG TABS tablet Take 1 tablet (550 mg total) by mouth 2 (two) times daily. 09/12/21  Yes Aline August, MD  ?spironolactone (ALDACTONE) 50 MG tablet Take 1 tablet (50 mg total) by mouth daily. 07/25/21  Yes Mercy Riding, MD  ?ferrous sulfate 325 (65 FE) MG tablet Take 1 tablet (325 mg total) by mouth daily with breakfast. ?Patient not taking: Reported on 10/14/2021 05/06/21 09/16/22  [name], MD  ?lidocaine (LIDODERM) 5 % Place 1 patch onto the skin daily. Remove & Discard patch within 12 hours or as directed by MD ?Patient not taking: Reported on 11/25/2021 10/12/21   [name], MD  ?   ? ?Allergies    ?Fluzone quadrivalent [influenza vac split quad]   ? ?Review of Systems   ?Review of Systems  ?Unable to perform ROS: Patient unresponsive  ? ?Physical Exam ?Updated Vital Signs ?BP 109/66   Pulse 91   Temp (!) 92.1 ?F (33.4 ?C)   Resp 14   Ht 5\' 8"  (1.727 m)   Wt 75.8 kg   SpO2 91%   BMI 25.41 kg/m?  ?Physical Exam ?Vitals and nursing note reviewed.  ?  Constitutional:   ?   Appearance: He is well-developed. He is ill-appearing.  ?   Interventions: Face mask in place.  ?HENT:  ?   Head: Normocephalic and atraumatic.  ?   Right Ear: External ear normal.  ?   Left Ear: External ear normal.  ?   Nose: Nose normal.  ?Cardiovascular:  ?   Rate and Rhythm: Normal rate. Rhythm irregular.  ?   Comments: Cold extremities ?Pulmonary:  ?   Effort: Pulmonary effort is normal. Tachypnea present. No respiratory distress.  ?   Breath sounds: Transmitted upper airway sounds present. No wheezing or rhonchi.  ?Abdominal:  ?   General: There is no distension.  ?   Palpations: Abdomen is soft.  ?   Tenderness: There is no abdominal tenderness.   ?Musculoskeletal:     ?   General: No swelling or deformity.  ?   Cervical back: Neck supple.  ?   Right lower leg: No edema.  ?   Left lower leg: No edema.  ?Skin: ?   General: Skin is warm and dry.  ?   Capillary Refill: Capillary refill takes more than 3 seconds.  ?Neurological:  ?   Mental Status: He is lethargic.  ?   GCS: GCS eye subscore is 1. GCS verbal subscore is 1. GCS motor subscore is 1.  ? ? ?ED Results / Procedures / Treatments   ?Labs ?(all labs ordered are listed, but only abnormal results are displayed) ?Labs Reviewed  ?LACTIC ACID, PLASMA - Abnormal; Notable for the following components:  ?    Result Value  ? Lactic Acid, Venous >9.0 (*)   ? All other components within normal limits  ?COMPREHENSIVE METABOLIC PANEL - Abnormal; Notable for the following components:  ? CO2 12 (*)   ? Glucose, Bld 133 (*)   ? BUN 56 (*)   ? Creatinine, Ser 3.34 (*)   ? Total Protein 6.0 (*)   ? Albumin 1.7 (*)   ? AST 267 (*)   ? ALT 123 (*)   ? Alkaline Phosphatase 1,248 (*)   ? Total Bilirubin 6.1 (*)   ? GFR, Estimated 18 (*)   ? Anion gap 22 (*)   ? All other components within normal limits  ?CBC WITH DIFFERENTIAL/PLATELET - Abnormal; Notable for the following components:  ? RBC 3.48 (*)   ? Hemoglobin 8.6 (*)   ? HCT 31.4 (*)   ? MCH 24.7 (*)   ? MCHC 27.4 (*)   ? RDW 25.6 (*)   ? Platelets 106 (*)   ? nRBC 0.6 (*)   ? Lymphs Abs 0.3 (*)   ? All other components within normal limits  ?URINALYSIS, ROUTINE W REFLEX MICROSCOPIC - Abnormal; Notable for the following components:  ? Color, Urine AMBER (*)   ? APPearance HAZY (*)   ? Leukocytes,Ua SMALL (*)   ? Bacteria, UA MANY (*)   ? All other components within normal limits  ?CBG MONITORING, ED - Abnormal; Notable for the following components:  ? Glucose-Capillary 118 (*)   ? All other components within normal limits  ?TROPONIN I (HIGH SENSITIVITY) - Abnormal; Notable for the following components:  ? Troponin I (High Sensitivity) 37 (*)   ? All other components  within normal limits  ?RESP PANEL BY RT-PCR (FLU A&B, COVID) [id number]  ?CULTURE, BLOOD (ROUTINE X 2)  ?CULTURE, BLOOD (ROUTINE X 2)  ?URINE CULTURE  ? ? ?EKG ?None ? ?Radiology ?CT Head Wo Contrast ? ?  Result Date: 11/09/2021 ?CLINICAL DATA:  Mental status change, unknown cause. Additional history provided: Patient found down, unresponsive. History of hepatic malignancy. EXAM: CT HEAD WITHOUT CONTRAST TECHNIQUE: Contiguous axial images were obtained from the base of the skull through the vertex without intravenous contrast. RADIATION DOSE REDUCTION: This exam was performed according to the departmental dose-optimization program which includes automated exposure control, adjustment of the mA and/or kV according to patient size and/or use of iterative reconstruction technique. COMPARISON:  Prior head CT examinations 10/28/2021 and earlier. FINDINGS: Mildly motion degraded exam. Brain: Mild generalized cerebral atrophy. Mild patchy and ill-defined hypoattenuation within the cerebral white matter, nonspecific but compatible with chronic small vessel ischemic disease. Redemonstrated small focal hypodensity within the inferior left basal ganglia, likely reflecting a prominent perivascular space. There is no acute intracranial hemorrhage. No demarcated cortical infarct. No extra-axial fluid collection. No evidence of an intracranial mass. No midline shift. Vascular: No hyperdense vessel. Atherosclerotic calcifications. Skull: Normal. Negative for fracture or focal lesion. Sinuses/Orbits: Visualized orbits show no acute finding. Redemonstrated symmetric foci of calcification along the posterior globes bilaterally, which may reflect optic disc drusen. Small fluid level, and background mild-to-moderate mucosal thickening, within the right maxillary sinus. Trace mucosal thickening within the left maxillary sinus. Mild mucosal thickening, and small-volume frothy secretions, within the right sphenoid sinus. Mild mucosal thickening  within the left sphenoid sinus. Mild mucosal thickening within the right ethmoid air cells. Mild mucosal thickening within the bilateral frontal sinuses. Superimposed small-volume frothy secretions within the right f

## 2021-11-17 NOTE — ED Triage Notes (Signed)
Pt [name] from home d/t being found in the floor by family & unresponsive. EMS reports he has liver CA that has metastasized, was hypothermic when they found him. BP 99/53, CBG 136. EMS reports family new the pt's washes was to be a DNR. ?

## 2021-11-17 NOTE — ED Notes (Signed)
Pt stabilized & transporting to CT. ?

## 2021-11-19 LAB — URINE CULTURE: Culture: >=100000 — AB

## 2021-11-22 LAB — CULTURE, BLOOD (ROUTINE X 2)
Culture: NO GROWTH
Culture: NO GROWTH

## 2021-11-27 NOTE — Death Summary Note (Signed)
?  Name: [name] ?MRN: [id number] ?DOB: May 08, 1946 76 y.o. ? ?Date of Admission: 11/05/2021 12:19 PM ?Date of Discharge: 2021-11-30 ?Attending Physician: Dr. Debbora Dus ? ?Discharge Diagnosis: ?Septic shock of unknown source ?Acute septic/metabolic encephalopathy ?Acute hypoxic respiratory failure ?Hypothermia ?Severe lactic acidosis ?Acute renal failure suspect ATN ?Elevated LFTs likely shock liver ?Thrombocytopenia ?Normocytic anemia ?History of hepatic cell carcinoma ?Pulmonary metastatic disease ?Cirrhosis ? ?Cause of death: Asystole ?Time of death: Approximately 4:00 AM 2021-11-30  ? ?Disposition and follow-up:   ?Mr.DAJOHN ELLENDER was discharged from [facility] in expired condition.   ? ?Hospital Course: ?76 year old male with past medical history of hepatic cirrhosis, hepatic cell carcinoma with pulmonary metastatic disease, CAD who was brought to the hospital due to acute encephalopathy.  He was found down on the floor by his granddaughter.  In the ED he was hypotensive and hypothermic, found to be in septic shock requiring Levophed started in the ED. CT head did not show any acute changes.  Chest x-ray showed persistent right malignant pleural effusion.  He has severe lactic acid of 9 with AKI, suspect ATN, and shock liver. ? ?Patient was seen by oncology with Dr. Chryl Heck for his hepatocellular carcinoma and extensive metastatic disease.  They recommended home hospice and patient was established with [facility].  ? ?I spoke with patient's wife, Demitrus Francisco and daughter Vickii Chafe in the ED. They state the patient had expressed in the past that he would not wanted to be on a hospital bed or hooked up to any machines.  They state that patient just wanted comfort care and control his pain.  I discussed his poor prognosis overall and explained that aggressive interventions would not likely to bring back any meaningful recovery.  Family verbalized understanding and would like to proceed with comfort care.    ? ?Signed: ?Gaylan Gerold, DO ?2021-11-30, 9:40 AM  ?

## 2021-11-27 NOTE — ED Notes (Signed)
ED TO INPATIENT HANDOFF REPORT ? ?ED Nurse Name and Phone #: Honesti Seaberg RN [phone] ? ?S ?Name/Age/Gender ?[name] ?76 y.o. ?male ?Room/Bed: 032C/032C ? ?Code Status ?  Code Status: DNR ? ?Home/SNF/Other ?Patient is comfort care; expected to pass shortly. ?Patient is not alert or oriented. ?Is this baseline? This has been patient's status since he arrived this afternoon.  ? ?Triage Complete: Triage complete  ?Chief Complaint ?Sepsis ([place]) [A41.9] ? ?Triage Note ?Pt BIB [facility] from home d/t being found in the floor by family & unresponsive. EMS reports he has liver CA that has metastasized, was hypothermic when they found him. BP 99/53, CBG 136. EMS reports family new the pt's washes was to be a DNR.  ? ?Allergies ?Allergies  ?Allergen Reactions  ? Fluzone Quadrivalent [Influenza Vac Split Quad] Shortness Of Breath  ?  The patient stated, in 10/2020: "I am not taking any more flu shots. It liked to have killed me." Shot up blood pressure and caused dizziness  ? ? ?Level of Care/Admitting Diagnosis ?ED Disposition   ? ? ED Disposition  ?Admit  ? Condition  ?--  ? Comment  ?Hospital Area: [facility] [[id number]] ? Level of Care: Palliative Care [15] ? May admit patient to [facility] or [facility] if equivalent level of care is available:: No ? Covid Evaluation: Confirmed COVID Negative ? Diagnosis: Sepsis ([place]) [[id number]] ? Admitting Physician: Kipp Brood [[id number]] ? Attending Physician: Kipp Brood [[id number]] ? Estimated length of stay: past midnight tomorrow ? Certification:: I certify this patient will need inpatient services for at least 2 midnights ?  ?  ? ?  ? ? ?B ?Medical/Surgery History ?Past Medical History:  ?Diagnosis Date  ? Arthritis   ? "joints tighten up sometimes" (03/27/2104)  ? Carcinoma of liver, hepatocellular ([place]) 06/30/2020  ? Chronic lower back pain   ? Coronary artery disease involving native coronary artery of native heart without angina pectoris   ? Severe left main  disease at catheterization July 2015  CABG x3 with a LIMA to the LAD, SVG to the OM, SVG to the PDA on 03/31/14. EF 60% by cath.   ? Diastolic heart failure ([place])   ? GERD without esophagitis 08/04/2010  ? Hepatic cirrhosis ([place])   ? a. Dx 01/2014 - CT a/p   ? History of blood transfusion   ? "related to bleeding ulcers"  ? History of concussion   ? 1976--  NO RESIDUAL  ? History of GI bleed   ? a. UGIB 07/2012;  b. 01/2014 admission with GIB/FOB stool req 1U prbc's->EGD showed portal gastropathy, barrett's esoph, and chronic active h. pylori gastritis.  ? History of gout   ? 2007 &  2008  LEFT LEG-- NO ISSUE SINCE  ? Hyperlipidemia   ? Iron deficiency anemia   ? Kidney stones   ? OA (osteoarthritis of spine)   ? LOWER BACK--  INTERMITTANT LEFT LEG NUMBNESS  ? OSA (obstructive sleep apnea)   ? PULMOLOGIST-  DR CLANCE--  MODERATE OSA  STARTED CPAP 2012--  BUT CURRENTLY HAS NOT USED PAST 6 MONTHS  ? Phimosis   ? a. s/p circumcision 2015.  ? Type 2 diabetes mellitus ([place])   ? Unspecified essential hypertension   ? ?Past Surgical History:  ?Procedure Laterality Date  ? ANKLE FRACTURE SURGERY Right 1989  ? "plate put in"  ? APPENDECTOMY  05-16-2004  ? open  ? BIOPSY  01/08/2021  ? Procedure: BIOPSY;  Surgeon: Wilford Corner,  MD;  Location: [facility];  Service: Endoscopy;;  ? BIOPSY  05/02/2021  ? Procedure: BIOPSY;  Surgeon: Arta Silence, MD;  Location: [facility];  Service: Endoscopy;;  ? CIRCUMCISION N/A 09/09/2013  ? Procedure: CIRCUMCISION ADULT;  Surgeon: Bernestine Amass, MD;  Location[facility];  Service: Urology;  Laterality: N/A;  ? COLECTOMY  05-16-2004  ? COLONOSCOPY N/A 05/02/2021  ? Procedure: COLONOSCOPY;  Surgeon: Arta Silence, MD;  Location: [facility];  Service: Endoscopy;  Laterality: N/A;  ? COLONOSCOPY WITH PROPOFOL N/A 11/20/2020  ? Procedure: COLONOSCOPY WITH PROPOFOL;  Surgeon: Ronald Lobo, MD;  Location: [facility];  Service: Endoscopy;  Laterality: N/A;  ? CORONARY  ANGIOPLASTY WITH STENT PLACEMENT  06/28/2012  DR Burt Knack  ? PCI W/  X1 DES to PROX. LAD/  LM  40% OSTIAL & 50-60% DISTAL /  50% PROX LCX/  30-40% PROX RCA & 50% MID RCA/   LVEF 65-70%  ? CORONARY ARTERY BYPASS GRAFT N/A 03/31/2014  ? Procedure: CORONARY ARTERY BYPASS GRAFTING (CABG) times 3 using left internal mammary artery and right saphenous vein.;  Surgeon: Melrose Nakayama, MD;  Location: [place];  Service: Open Heart Surgery;  Laterality: N/A;  ? DOBUTAMINE STRESS ECHO  06-08-2012  ? MODERATE HYPOKINESIS/ ISCHEMIA MID INFERIOR WALL  ? ESOPHAGOGASTRODUODENOSCOPY  08/15/2012  ? Procedure: ESOPHAGOGASTRODUODENOSCOPY (EGD);  Surgeon: Wonda Horner, MD;  [facility]COPY;  Service: Endoscopy;  Laterality: N/A;  ? ESOPHAGOGASTRODUODENOSCOPY N/A 02/17/2014  ? Procedure: ESOPHAGOGASTRODUODENOSCOPY (EGD);  Surgeon: Jeryl Columbia, MD;  Location: [facility] ENDOSCOPY;  Service: Endoscopy;  Laterality: N/A;  ? ESOPHAGOGASTRODUODENOSCOPY N/A 04/17/2020  ? Procedure: ESOPHAGOGASTRODUODENOSCOPY (EGD);  Surgeon: Ronnette Juniper, MD;  Location: [place];  Service: Gastroenterology;  Laterality: N/A;  ? ESOPHAGOGASTRODUODENOSCOPY N/A 09/11/2020  ? Procedure: ESOPHAGOGASTRODUODENOSCOPY (EGD);  Surgeon: Clarene Essex, MD;  Location: [facility];  Service: Endoscopy;  Laterality: N/A;  ? ESOPHAGOGASTRODUODENOSCOPY  10/09/2020  ? ESOPHAGOGASTRODUODENOSCOPY N/A 10/09/2020  ? Procedure: ESOPHAGOGASTRODUODENOSCOPY (EGD);  Surgeon: Otis Brace, MD;  [facility] ENDOSCOPY;  Service: Gastroenterology;  Laterality: N/A;  ? ESOPHAGOGASTRODUODENOSCOPY N/A 01/08/2021  ? Procedure: ESOPHAGOGASTRODUODENOSCOPY (EGD);  Surgeon: Wilford Corner, MD;  Location: [facility];  Service: Endoscopy;  Laterality: N/A;  ? ESOPHAGOGASTRODUODENOSCOPY N/A 05/02/2021  ? Procedure: ESOPHAGOGASTRODUODENOSCOPY (EGD);  Surgeon: Arta Silence, MD;  Location: [facility];  Service: Endoscopy;  Laterality: N/A;  ? ESOPHAGOGASTRODUODENOSCOPY (EGD) WITH PROPOFOL N/A  09/30/2017  ? Procedure: ESOPHAGOGASTRODUODENOSCOPY (EGD) WITH PROPOFOL;  Surgeon: Wonda Horner, MD;  Location: [facility] ENDOSCOPY;  Service: Endoscopy;  Laterality: N/A;  ? ESOPHAGOGASTRODUODENOSCOPY (EGD) WITH PROPOFOL N/A 12/20/2017  ? Procedure: ESOPHAGOGASTRODUODENOSCOPY (EGD) WITH PROPOFOL;  Surgeon: Clarene Essex, MD;  Location: [place];  Service: Endoscopy;  Laterality: N/A;  ? ESOPHAGOGASTRODUODENOSCOPY (EGD) WITH PROPOFOL N/A 08/10/2020  ? Procedure: ESOPHAGOGASTRODUODENOSCOPY (EGD) WITH PROPOFOL;  Surgeon: Wilford Corner, MD;  Location: [place];  Service: Endoscopy;  Laterality: N/A;  ? ESOPHAGOGASTRODUODENOSCOPY (EGD) WITH PROPOFOL N/A 11/20/2020  ? Procedure: ESOPHAGOGASTRODUODENOSCOPY (EGD) WITH PROPOFOL;  Surgeon: Ronald Lobo, MD;  Location: [facility];  Service: Endoscopy;  Laterality: N/A;  ? ESOPHAGOGASTRODUODENOSCOPY (EGD) WITH PROPOFOL N/A 02/28/2021  ? Procedure: ESOPHAGOGASTRODUODENOSCOPY (EGD) WITH PROPOFOL;  Surgeon: Clarene Essex, MD;  Location: [facility];  Service: Endoscopy;  Laterality: N/A;  ? GIVENS CAPSULE STUDY N/A 11/20/2020  ? Procedure: GIVENS CAPSULE STUDY;  Surgeon: Ronald Lobo, MD;  Location: [facility];  Service: Endoscopy;  Laterality: N/A;  ? HEMOSTASIS CLIP PLACEMENT  05/02/2021  ? Procedure: HEMOSTASIS CLIP PLACEMENT;  Surgeon: [name],  Gwyndolyn Saxon, MD;  Location: [facility];  Service: Endoscopy;;  ? HERNIA REPAIR    ? INTRAOPERATIVE TRANSESOPHAGEAL ECHOCARDIOGRAM N/A 03/31/2014  ? Procedure: INTRAOPERATIVE TRANSESOPHAGEAL ECHOCARDIOGRAM;  Surgeon: Melrose Nakayama, MD;  Location: [place];  Service: Open Heart Surgery;  Laterality: N/A;  ? IR ANGIOGRAM SELECTIVE EACH ADDITIONAL VESSEL  06/16/2020  ? IR ANGIOGRAM SELECTIVE EACH ADDITIONAL VESSEL  06/16/2020  ? IR ANGIOGRAM SELECTIVE EACH ADDITIONAL VESSEL  06/16/2020  ? IR ANGIOGRAM SELECTIVE EACH ADDITIONAL VESSEL  06/16/2020  ? IR ANGIOGRAM SELECTIVE EACH ADDITIONAL VESSEL  06/16/2020  ? IR ANGIOGRAM SELECTIVE EACH  ADDITIONAL VESSEL  06/16/2020  ? IR ANGIOGRAM SELECTIVE EACH ADDITIONAL VESSEL  06/16/2020  ? IR ANGIOGRAM SELECTIVE EACH ADDITIONAL VESSEL  06/16/2020  ? IR ANGIOGRAM SELECTIVE EACH ADDITIONAL VESSEL  10/19/

## 2021-11-27 NOTE — Progress Notes (Addendum)
Patient expired before arriving the floor, [facility] Shanon Brow made aware,son was informed, he gave [place] permission to send  it to the morgue and promise to be here in the morning. Assigned nurse went ahead and transferred the patient from the Ed stretcher and followed all the protocols. (Check post mortem flow sheet for detailed info) ?

## 2021-11-27 DEATH — deceased

## 2022-01-10 ENCOUNTER — Ambulatory Visit: Payer: Medicare HMO | Admitting: Nurse Practitioner

## 2022-01-11 IMAGING — NM NM LIVER IMG SPECT
2 series · 12 of 12 positions shown · non-contrast
Comparison: MRI 05/13/2020, MAA scan 06/16/2020

CLINICAL DATA: Solitary hepatocellular carcinoma within segment 8
of the liver. Yttrium 90 microspheres radiation segmentectomy of
segment 8.

EXAM:
NUCLEAR MEDICINE SPECIAL MED RAD PHYSICS CONS; NUCLEAR MEDICINE
RADIO PHARM THERAPY INTRA ARTERIAL; NUCLEAR MEDICINE TREATMENT
PROCEDURE; NUCLEAR MEDICINE LIVER SCAN
Hip
TECHNIQUE: In conjunction with the interventional radiologist a Y- Microsphere
dose was calculated utilizing body surface area and DAVR app
segmentectomy formulation. Calculated dose equal 27.4. mCi. Pre
therapy MAA liver SPECT scan and CTA were evaluated. Utilizing a
microcatheter system, the RIGHT hepatic artery was selected and Y-90
microspheres were delivered in fractionated aliquots.
Radiopharmaceutical was delivered by the interventional radiologist
and nuclear radiologist.
The patient tolerated procedure well. No adverse effects were noted.
Bremsstrahlung planar and SPECT imaging of the abdomen following
intrahepatic arterial delivery of Y-90 microsphere was performed.
RADIOPHARMACEUTICALS:  23.9 millicuries yttrium 90 microspheres

[Series 1: spect - (id) _(id)_cor · 4.1mm · 4.14mm/px · 6 of 128 frames shown]
[frame 11/128]
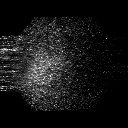
[frame 32/128]
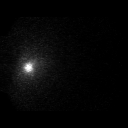
[frame 54/128]
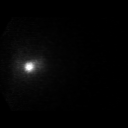
[frame 75/128]
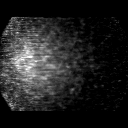
[frame 96/128]
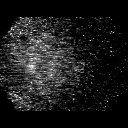
[frame 118/128]
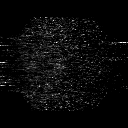

[Series 1: spect - (id) _(id)_tra · 4.1mm · 4.14mm/px · 6 of 128 frames shown]
[frame 11/128]
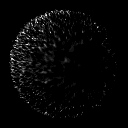
[frame 32/128]
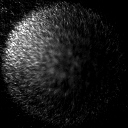
[frame 54/128]
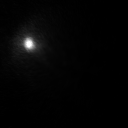
[frame 75/128]
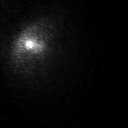
[frame 96/128]
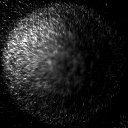
[frame 118/128]
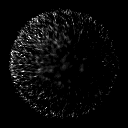

[12 of 12 positions shown; findings below may reference images not displayed]

FINDINGS: [AGE] microspheres therapy as above. First therapy the right
hepatic lobe. Directed segmentectomy of segment 8 RIGHT hepatic
lobe.

Bremsstrahlung planar and SPECT imaging of the abdomen following
intrahepatic arterial delivery of W-1Dmicrosphere demonstrates
radioactivity localized to the near entirely within the lesion of
within the RIGHT hepatic lobe. No evidence of extrahepatic activity.
IMPRESSION: Successful [AGE] microsphere delivery for treatment of unresectable
liver metastasis. First therapy to the RIGHT lobe. Radiation
segmentectomy segment VIII.

Bremssstrahlung scan demonstrates activity localized to solitary
lesion within the hepatic lobe with no extrahepatic activity
identified.
